# Patient Record
Sex: Female | Born: 1970
Health system: Southern US, Community
[De-identification: ages and names within clinical notes are randomized; demographics above are authoritative.]

## PROBLEM LIST (undated history)

## (undated) ENCOUNTER — Emergency Department (HOSPITAL_COMMUNITY): Admission: EM | Discharge status: Absent without leave | Payer: BC Managed Care – PPO

## (undated) DIAGNOSIS — N183 Chronic kidney disease, stage 3 unspecified: Secondary | ICD-10-CM

## (undated) DIAGNOSIS — R000 Tachycardia, unspecified: Secondary | ICD-10-CM

## (undated) DIAGNOSIS — K219 Gastro-esophageal reflux disease without esophagitis: Secondary | ICD-10-CM

## (undated) DIAGNOSIS — R519 Headache, unspecified: Secondary | ICD-10-CM

## (undated) DIAGNOSIS — M810 Age-related osteoporosis without current pathological fracture: Secondary | ICD-10-CM

## (undated) DIAGNOSIS — R0902 Hypoxemia: Secondary | ICD-10-CM

## (undated) DIAGNOSIS — R002 Palpitations: Secondary | ICD-10-CM

## (undated) DIAGNOSIS — R011 Cardiac murmur, unspecified: Secondary | ICD-10-CM

## (undated) DIAGNOSIS — K37 Unspecified appendicitis: Secondary | ICD-10-CM

## (undated) DIAGNOSIS — R7303 Prediabetes: Secondary | ICD-10-CM

## (undated) DIAGNOSIS — L03011 Cellulitis of right finger: Secondary | ICD-10-CM

## (undated) DIAGNOSIS — M726 Necrotizing fasciitis: Secondary | ICD-10-CM

## (undated) DIAGNOSIS — E2740 Unspecified adrenocortical insufficiency: Secondary | ICD-10-CM

## (undated) DIAGNOSIS — I50810 Right heart failure, unspecified: Secondary | ICD-10-CM

## (undated) DIAGNOSIS — I428 Other cardiomyopathies: Secondary | ICD-10-CM

## (undated) DIAGNOSIS — I7300 Raynaud's syndrome without gangrene: Secondary | ICD-10-CM

## (undated) DIAGNOSIS — F32A Depression, unspecified: Secondary | ICD-10-CM

## (undated) DIAGNOSIS — J45991 Cough variant asthma: Secondary | ICD-10-CM

## (undated) DIAGNOSIS — R569 Unspecified convulsions: Secondary | ICD-10-CM

## (undated) DIAGNOSIS — F419 Anxiety disorder, unspecified: Secondary | ICD-10-CM

## (undated) DIAGNOSIS — D279 Benign neoplasm of unspecified ovary: Secondary | ICD-10-CM

## (undated) DIAGNOSIS — I272 Pulmonary hypertension, unspecified: Secondary | ICD-10-CM

## (undated) DIAGNOSIS — C50919 Malignant neoplasm of unspecified site of unspecified female breast: Secondary | ICD-10-CM

## (undated) DIAGNOSIS — I471 Supraventricular tachycardia, unspecified: Secondary | ICD-10-CM

## (undated) DIAGNOSIS — I350 Nonrheumatic aortic (valve) stenosis: Secondary | ICD-10-CM

## (undated) DIAGNOSIS — F329 Major depressive disorder, single episode, unspecified: Secondary | ICD-10-CM

## (undated) DIAGNOSIS — Z87442 Personal history of urinary calculi: Secondary | ICD-10-CM

## (undated) DIAGNOSIS — J189 Pneumonia, unspecified organism: Secondary | ICD-10-CM

## (undated) DIAGNOSIS — Z9981 Dependence on supplemental oxygen: Secondary | ICD-10-CM

## (undated) DIAGNOSIS — R51 Headache: Secondary | ICD-10-CM

## (undated) DIAGNOSIS — C539 Malignant neoplasm of cervix uteri, unspecified: Secondary | ICD-10-CM

## (undated) DIAGNOSIS — G4730 Sleep apnea, unspecified: Secondary | ICD-10-CM

## (undated) DIAGNOSIS — C8190 Hodgkin lymphoma, unspecified, unspecified site: Secondary | ICD-10-CM

## (undated) DIAGNOSIS — D649 Anemia, unspecified: Secondary | ICD-10-CM

## (undated) DIAGNOSIS — E271 Primary adrenocortical insufficiency: Secondary | ICD-10-CM

## (undated) DIAGNOSIS — R112 Nausea with vomiting, unspecified: Secondary | ICD-10-CM

## (undated) DIAGNOSIS — R918 Other nonspecific abnormal finding of lung field: Secondary | ICD-10-CM

## (undated) DIAGNOSIS — D352 Benign neoplasm of pituitary gland: Secondary | ICD-10-CM

## (undated) DIAGNOSIS — I10 Essential (primary) hypertension: Secondary | ICD-10-CM

## (undated) DIAGNOSIS — J9611 Chronic respiratory failure with hypoxia: Secondary | ICD-10-CM

## (undated) DIAGNOSIS — C73 Malignant neoplasm of thyroid gland: Secondary | ICD-10-CM

## (undated) DIAGNOSIS — Z9889 Other specified postprocedural states: Secondary | ICD-10-CM

## (undated) HISTORY — DX: Unspecified adrenocortical insufficiency: E27.40

## (undated) HISTORY — DX: Palpitations: R00.2

## (undated) HISTORY — DX: Raynaud's syndrome without gangrene: I73.00

## (undated) HISTORY — DX: Gastro-esophageal reflux disease without esophagitis: K21.9

## (undated) HISTORY — DX: Tachycardia, unspecified: R00.0

## (undated) HISTORY — DX: Chronic kidney disease, stage 3 unspecified: N18.30

## (undated) HISTORY — DX: Other nonspecific abnormal finding of lung field: R91.8

## (undated) HISTORY — DX: Benign neoplasm of unspecified ovary: D27.9

## (undated) HISTORY — PX: MASTECTOMY: SHX3

## (undated) HISTORY — DX: Right heart failure, unspecified: I50.810

## (undated) HISTORY — DX: Essential (primary) hypertension: I10

## (undated) HISTORY — DX: Unspecified appendicitis: K37

## (undated) HISTORY — PX: COLONOSCOPY: SHX174

## (undated) HISTORY — PX: LUMBAR PUNCTURE W/ INTRATHECAL CHEMOTHERAPY: SHX1986

## (undated) HISTORY — DX: Malignant neoplasm of thyroid gland: C73

## (undated) HISTORY — DX: Hodgkin lymphoma, unspecified, unspecified site: C81.90

## (undated) HISTORY — PX: ABDOMINAL HYSTERECTOMY: SHX81

## (undated) HISTORY — DX: Age-related osteoporosis without current pathological fracture: M81.0

## (undated) HISTORY — PX: APPENDECTOMY: SHX54

## (undated) HISTORY — PX: PITUITARY SURGERY: SHX203

## (undated) HISTORY — PX: KIDNEY STONE SURGERY: SHX686

## (undated) HISTORY — PX: TOTAL THYROIDECTOMY: SHX2547

## (undated) HISTORY — PX: OTHER SURGICAL HISTORY: SHX169

## (undated) HISTORY — DX: Other cardiomyopathies: I42.8

## (undated) HISTORY — DX: Malignant neoplasm of unspecified site of unspecified female breast: C50.919

---
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
## 2003-08-03 ENCOUNTER — Ambulatory Visit (HOSPITAL_COMMUNITY): Admission: RE | Admit: 2003-08-03 | Discharge: 2003-08-03 | Payer: Self-pay | Admitting: *Deleted

## 2003-08-03 ENCOUNTER — Encounter (INDEPENDENT_AMBULATORY_CARE_PROVIDER_SITE_OTHER): Payer: Self-pay | Admitting: Specialist

## 2003-08-23 ENCOUNTER — Ambulatory Visit (HOSPITAL_COMMUNITY): Admission: RE | Admit: 2003-08-23 | Discharge: 2003-08-23 | Payer: Self-pay | Admitting: *Deleted

## 2003-11-05 ENCOUNTER — Ambulatory Visit (HOSPITAL_COMMUNITY): Admission: RE | Admit: 2003-11-05 | Discharge: 2003-11-05 | Payer: Self-pay | Admitting: *Deleted

## 2004-05-16 ENCOUNTER — Encounter: Admission: RE | Admit: 2004-05-16 | Discharge: 2004-05-16 | Payer: Self-pay | Admitting: *Deleted

## 2004-05-17 ENCOUNTER — Encounter (INDEPENDENT_AMBULATORY_CARE_PROVIDER_SITE_OTHER): Payer: Self-pay | Admitting: *Deleted

## 2004-05-17 ENCOUNTER — Ambulatory Visit (HOSPITAL_COMMUNITY): Admission: RE | Admit: 2004-05-17 | Discharge: 2004-05-17 | Payer: Self-pay | Admitting: *Deleted

## 2004-05-25 ENCOUNTER — Encounter: Admission: RE | Admit: 2004-05-25 | Discharge: 2004-05-25 | Payer: Self-pay | Admitting: Otolaryngology

## 2004-08-21 ENCOUNTER — Encounter (INDEPENDENT_AMBULATORY_CARE_PROVIDER_SITE_OTHER): Payer: Self-pay | Admitting: Specialist

## 2004-08-21 ENCOUNTER — Ambulatory Visit (HOSPITAL_COMMUNITY): Admission: RE | Admit: 2004-08-21 | Discharge: 2004-08-21 | Payer: Self-pay | Admitting: *Deleted

## 2004-12-19 ENCOUNTER — Other Ambulatory Visit: Admission: RE | Admit: 2004-12-19 | Discharge: 2004-12-19 | Payer: Self-pay | Admitting: Gynecology

## 2005-04-15 ENCOUNTER — Inpatient Hospital Stay (HOSPITAL_COMMUNITY): Admission: AD | Admit: 2005-04-15 | Discharge: 2005-04-15 | Payer: Self-pay | Admitting: Obstetrics & Gynecology

## 2005-05-24 ENCOUNTER — Inpatient Hospital Stay (HOSPITAL_COMMUNITY): Admission: AD | Admit: 2005-05-24 | Discharge: 2005-05-24 | Payer: Self-pay | Admitting: Obstetrics & Gynecology

## 2005-06-23 ENCOUNTER — Inpatient Hospital Stay (HOSPITAL_COMMUNITY): Admission: AD | Admit: 2005-06-23 | Discharge: 2005-06-23 | Payer: Self-pay | Admitting: Obstetrics and Gynecology

## 2005-06-25 ENCOUNTER — Observation Stay (HOSPITAL_COMMUNITY): Admission: AD | Admit: 2005-06-25 | Discharge: 2005-06-25 | Payer: Self-pay | Admitting: Obstetrics and Gynecology

## 2005-07-10 ENCOUNTER — Ambulatory Visit: Payer: Self-pay | Admitting: Neonatology

## 2005-07-10 ENCOUNTER — Inpatient Hospital Stay (HOSPITAL_COMMUNITY): Admission: AD | Admit: 2005-07-10 | Discharge: 2005-07-18 | Payer: Self-pay | Admitting: *Deleted

## 2005-07-18 ENCOUNTER — Encounter (INDEPENDENT_AMBULATORY_CARE_PROVIDER_SITE_OTHER): Payer: Self-pay | Admitting: *Deleted

## 2005-12-27 ENCOUNTER — Ambulatory Visit (HOSPITAL_COMMUNITY): Admission: RE | Admit: 2005-12-27 | Discharge: 2005-12-27 | Payer: Self-pay | Admitting: Obstetrics and Gynecology

## 2006-03-08 ENCOUNTER — Inpatient Hospital Stay (HOSPITAL_COMMUNITY): Admission: AD | Admit: 2006-03-08 | Discharge: 2006-03-16 | Payer: Self-pay | Admitting: Obstetrics and Gynecology

## 2006-03-11 ENCOUNTER — Ambulatory Visit: Payer: Self-pay | Admitting: Neonatology

## 2006-03-12 ENCOUNTER — Encounter (INDEPENDENT_AMBULATORY_CARE_PROVIDER_SITE_OTHER): Payer: Self-pay | Admitting: Specialist

## 2006-03-12 ENCOUNTER — Ambulatory Visit: Payer: Self-pay | Admitting: Neonatology

## 2006-04-12 ENCOUNTER — Ambulatory Visit (HOSPITAL_COMMUNITY): Admission: RE | Admit: 2006-04-12 | Discharge: 2006-04-12 | Payer: Self-pay | Admitting: Obstetrics and Gynecology

## 2006-11-19 ENCOUNTER — Emergency Department (HOSPITAL_COMMUNITY): Admission: EM | Admit: 2006-11-19 | Discharge: 2006-11-19 | Payer: Self-pay | Admitting: Emergency Medicine

## 2008-12-07 ENCOUNTER — Emergency Department (HOSPITAL_COMMUNITY): Admission: EM | Admit: 2008-12-07 | Discharge: 2008-12-07 | Payer: Self-pay | Admitting: Emergency Medicine

## 2008-12-20 ENCOUNTER — Observation Stay (HOSPITAL_COMMUNITY): Admission: AD | Admit: 2008-12-20 | Discharge: 2008-12-21 | Payer: Self-pay | Admitting: Cardiovascular Disease

## 2009-03-15 ENCOUNTER — Emergency Department (HOSPITAL_COMMUNITY): Admission: EM | Admit: 2009-03-15 | Discharge: 2009-03-15 | Payer: Self-pay | Admitting: Emergency Medicine

## 2009-05-05 ENCOUNTER — Encounter: Admission: RE | Admit: 2009-05-05 | Discharge: 2009-05-05 | Payer: Self-pay | Admitting: *Deleted

## 2009-05-10 ENCOUNTER — Emergency Department (HOSPITAL_BASED_OUTPATIENT_CLINIC_OR_DEPARTMENT_OTHER): Admission: EM | Admit: 2009-05-10 | Discharge: 2009-05-10 | Payer: Self-pay | Admitting: Emergency Medicine

## 2009-05-18 ENCOUNTER — Inpatient Hospital Stay (HOSPITAL_BASED_OUTPATIENT_CLINIC_OR_DEPARTMENT_OTHER): Admission: RE | Admit: 2009-05-18 | Discharge: 2009-05-18 | Payer: Self-pay | Admitting: Cardiovascular Disease

## 2009-05-18 HISTORY — PX: CARDIAC CATHETERIZATION: SHX172

## 2009-05-20 ENCOUNTER — Encounter: Admission: RE | Admit: 2009-05-20 | Discharge: 2009-05-20 | Payer: Self-pay | Admitting: Anesthesiology

## 2009-07-29 ENCOUNTER — Emergency Department (HOSPITAL_COMMUNITY): Admission: EM | Admit: 2009-07-29 | Discharge: 2009-07-29 | Payer: Self-pay | Admitting: Emergency Medicine

## 2009-07-30 ENCOUNTER — Emergency Department (HOSPITAL_COMMUNITY): Admission: EM | Admit: 2009-07-30 | Discharge: 2009-07-31 | Payer: Self-pay | Admitting: Emergency Medicine

## 2009-09-27 ENCOUNTER — Emergency Department (HOSPITAL_COMMUNITY): Admission: EM | Admit: 2009-09-27 | Discharge: 2009-09-27 | Payer: Self-pay | Admitting: Emergency Medicine

## 2009-10-07 ENCOUNTER — Emergency Department (HOSPITAL_COMMUNITY): Admission: EM | Admit: 2009-10-07 | Discharge: 2009-10-07 | Payer: Self-pay | Admitting: Emergency Medicine

## 2009-11-22 ENCOUNTER — Emergency Department (HOSPITAL_COMMUNITY): Admission: EM | Admit: 2009-11-22 | Discharge: 2009-11-22 | Payer: Self-pay | Admitting: Emergency Medicine

## 2009-12-04 ENCOUNTER — Emergency Department (HOSPITAL_COMMUNITY): Admission: EM | Admit: 2009-12-04 | Discharge: 2009-12-04 | Payer: Self-pay | Admitting: Family Medicine

## 2009-12-19 ENCOUNTER — Ambulatory Visit (HOSPITAL_COMMUNITY): Admission: EM | Admit: 2009-12-19 | Discharge: 2009-12-20 | Payer: Self-pay | Admitting: Emergency Medicine

## 2009-12-19 ENCOUNTER — Encounter (INDEPENDENT_AMBULATORY_CARE_PROVIDER_SITE_OTHER): Payer: Self-pay | Admitting: Surgery

## 2009-12-19 DIAGNOSIS — K37 Unspecified appendicitis: Secondary | ICD-10-CM

## 2009-12-19 HISTORY — DX: Unspecified appendicitis: K37

## 2009-12-20 ENCOUNTER — Emergency Department (HOSPITAL_COMMUNITY): Admission: EM | Admit: 2009-12-20 | Discharge: 2009-12-20 | Payer: Self-pay | Admitting: Emergency Medicine

## 2009-12-29 ENCOUNTER — Emergency Department (HOSPITAL_COMMUNITY): Admission: EM | Admit: 2009-12-29 | Discharge: 2009-12-29 | Payer: Self-pay | Admitting: Emergency Medicine

## 2010-01-23 ENCOUNTER — Emergency Department (HOSPITAL_COMMUNITY): Admission: EM | Admit: 2010-01-23 | Discharge: 2010-01-23 | Payer: Self-pay | Admitting: Emergency Medicine

## 2010-01-28 ENCOUNTER — Emergency Department (HOSPITAL_COMMUNITY): Admission: EM | Admit: 2010-01-28 | Discharge: 2010-01-28 | Payer: Self-pay | Admitting: Emergency Medicine

## 2010-01-28 ENCOUNTER — Emergency Department (HOSPITAL_COMMUNITY): Admission: EM | Admit: 2010-01-28 | Discharge: 2010-01-28 | Payer: Self-pay | Admitting: Family Medicine

## 2010-03-17 ENCOUNTER — Inpatient Hospital Stay (HOSPITAL_COMMUNITY)
Admission: EM | Admit: 2010-03-17 | Discharge: 2010-03-18 | Payer: Self-pay | Source: Home / Self Care | Admitting: Emergency Medicine

## 2010-03-18 ENCOUNTER — Encounter (INDEPENDENT_AMBULATORY_CARE_PROVIDER_SITE_OTHER): Payer: Self-pay | Admitting: Internal Medicine

## 2010-04-11 ENCOUNTER — Emergency Department (HOSPITAL_COMMUNITY): Admission: EM | Admit: 2010-04-11 | Discharge: 2010-04-11 | Payer: Self-pay | Admitting: Emergency Medicine

## 2010-05-18 ENCOUNTER — Ambulatory Visit: Payer: Self-pay | Admitting: Cardiovascular Disease

## 2010-07-10 ENCOUNTER — Ambulatory Visit: Payer: Self-pay | Admitting: Cardiovascular Disease

## 2010-09-12 ENCOUNTER — Emergency Department (HOSPITAL_BASED_OUTPATIENT_CLINIC_OR_DEPARTMENT_OTHER)
Admission: EM | Admit: 2010-09-12 | Discharge: 2010-09-12 | Payer: Self-pay | Source: Home / Self Care | Admitting: Emergency Medicine

## 2010-10-13 ENCOUNTER — Ambulatory Visit: Payer: Self-pay | Admitting: Cardiovascular Disease

## 2010-10-22 ENCOUNTER — Encounter: Payer: Self-pay | Admitting: Family Medicine

## 2010-11-06 ENCOUNTER — Ambulatory Visit (INDEPENDENT_AMBULATORY_CARE_PROVIDER_SITE_OTHER): Payer: Self-pay | Admitting: Cardiovascular Disease

## 2010-11-06 DIAGNOSIS — I359 Nonrheumatic aortic valve disorder, unspecified: Secondary | ICD-10-CM

## 2010-11-10 ENCOUNTER — Ambulatory Visit (HOSPITAL_COMMUNITY)
Admission: RE | Admit: 2010-11-10 | Discharge: 2010-11-10 | Disposition: A | Discharge status: Home / Self Care | Payer: Commercial Managed Care - PPO | Source: Ambulatory Visit | Attending: Cardiovascular Disease | Admitting: Cardiovascular Disease

## 2010-11-10 DIAGNOSIS — I359 Nonrheumatic aortic valve disorder, unspecified: Secondary | ICD-10-CM

## 2010-11-10 DIAGNOSIS — Z0389 Encounter for observation for other suspected diseases and conditions ruled out: Secondary | ICD-10-CM | POA: Insufficient documentation

## 2010-11-29 ENCOUNTER — Ambulatory Visit (INDEPENDENT_AMBULATORY_CARE_PROVIDER_SITE_OTHER): Payer: Commercial Managed Care - PPO | Admitting: Cardiology

## 2010-11-29 DIAGNOSIS — R609 Edema, unspecified: Secondary | ICD-10-CM

## 2010-11-29 DIAGNOSIS — R05 Cough: Secondary | ICD-10-CM

## 2010-11-29 DIAGNOSIS — R059 Cough, unspecified: Secondary | ICD-10-CM

## 2010-12-12 LAB — BASIC METABOLIC PANEL
CO2: 27 mEq/L (ref 19–32)
Calcium: 9.3 mg/dL (ref 8.4–10.5)
Chloride: 105 mEq/L (ref 96–112)
GFR calc Af Amer: >60 mL/min (ref >60)
Sodium: 143 mEq/L (ref 135–145)

## 2010-12-12 LAB — CBC
Hemoglobin: 12.2 g/dL (ref 12.0–15.0)
MCH: 27.7 pg (ref 26.0–34.0)
MCV: 83 fL (ref 78.0–100.0)
RBC: 4.4 MIL/uL (ref 3.87–5.11)
WBC: 8 10*3/uL (ref 4.0–10.5)

## 2010-12-12 LAB — DIFFERENTIAL
Eosinophils Absolute: 0.1 10*3/uL (ref 0.0–0.7)
Lymphocytes Relative: 18 % (ref 12–46)
Lymphs Abs: 1.4 10*3/uL (ref 0.7–4.0)
Monocytes Relative: 5 % (ref 3–12)
Neutrophils Relative %: 76 % (ref 43–77)

## 2010-12-12 LAB — HEPATIC FUNCTION PANEL
ALT: 21 U/L (ref 0–35)
AST: 20 U/L (ref 0–37)
Albumin: 4.3 g/dL (ref 3.5–5.2)
Alkaline Phosphatase: 68 U/L (ref 39–117)
Total Bilirubin: 0.5 mg/dL (ref 0.3–1.2)
Total Protein: 7.8 g/dL (ref 6.0–8.3)

## 2010-12-12 LAB — URINALYSIS, ROUTINE W REFLEX MICROSCOPIC
Glucose, UA: NEGATIVE mg/dL
Hgb urine dipstick: NEGATIVE
Specific Gravity, Urine: 1.012 (ref 1.005–1.030)

## 2010-12-12 LAB — LIPASE, BLOOD: Lipase: 96 U/L (ref 23–300)

## 2010-12-17 LAB — CK TOTAL AND CKMB (NOT AT ARMC)
Relative Index: 1.8 (ref 0.0–2.5)
Total CK: 171 U/L (ref 7–177)

## 2010-12-17 LAB — DIFFERENTIAL
Basophils Absolute: 0 10*3/uL (ref 0.0–0.1)
Basophils Relative: 1 % (ref 0–1)
Eosinophils Absolute: 0 10*3/uL (ref 0.0–0.7)
Eosinophils Absolute: 0 10*3/uL (ref 0.0–0.7)
Eosinophils Relative: 0 % (ref 0–5)
Eosinophils Relative: 1 % (ref 0–5)
Lymphocytes Relative: 20 % (ref 12–46)
Lymphs Abs: 1.7 10*3/uL (ref 0.7–4.0)
Monocytes Absolute: 0.5 10*3/uL (ref 0.1–1.0)
Monocytes Absolute: 0.7 10*3/uL (ref 0.1–1.0)
Monocytes Relative: 6 % (ref 3–12)

## 2010-12-17 LAB — COMPREHENSIVE METABOLIC PANEL
ALT: 16 U/L (ref 0–35)
AST: 20 U/L (ref 0–37)
AST: 22 U/L (ref 0–37)
Albumin: 3.6 g/dL (ref 3.5–5.2)
Albumin: 4.3 g/dL (ref 3.5–5.2)
Alkaline Phosphatase: 23 U/L — ABNORMAL LOW (ref 39–117)
BUN: 6 mg/dL (ref 6–23)
Calcium: 9.5 mg/dL (ref 8.4–10.5)
Chloride: 103 mEq/L (ref 96–112)
Creatinine, Ser: 0.89 mg/dL (ref 0.4–1.2)
GFR calc Af Amer: >60 mL/min (ref >60)
GFR calc Af Amer: >60 mL/min (ref >60)
Potassium: 3.8 mEq/L (ref 3.5–5.1)
Sodium: 138 mEq/L (ref 135–145)
Sodium: 139 mEq/L (ref 135–145)
Total Protein: 6.7 g/dL (ref 6.0–8.3)

## 2010-12-17 LAB — URINALYSIS, ROUTINE W REFLEX MICROSCOPIC
Bilirubin Urine: NEGATIVE
Bilirubin Urine: NEGATIVE
Hgb urine dipstick: NEGATIVE
Nitrite: NEGATIVE
Specific Gravity, Urine: 1.019 (ref 1.005–1.030)
Specific Gravity, Urine: 1.006 (ref 1.005–1.030)
Urobilinogen, UA: 1 mg/dL (ref 0.0–1.0)
Urobilinogen, UA: 1 mg/dL (ref 0.0–1.0)
pH: 6.5 (ref 5.0–8.0)

## 2010-12-17 LAB — LIPID PANEL
Triglycerides: 144 mg/dL (ref <150)
VLDL: 29 mg/dL (ref 0–40)

## 2010-12-17 LAB — CBC
HCT: 40.6 % (ref 36.0–46.0)
HCT: 37.3 % (ref 36.0–46.0)
HCT: 41.7 % (ref 36.0–46.0)
Hemoglobin: 13.6 g/dL (ref 12.0–15.0)
MCH: 29.8 pg (ref 26.0–34.0)
MCHC: 33.6 g/dL (ref 30.0–36.0)
MCV: 88.6 fL (ref 78.0–100.0)
MCV: 88.9 fL (ref 78.0–100.0)
MCV: 89.4 fL (ref 78.0–100.0)
Platelets: 254 10*3/uL (ref 150–400)
Platelets: 261 10*3/uL (ref 150–400)
Platelets: 298 10*3/uL (ref 150–400)
RBC: 4.66 MIL/uL (ref 3.87–5.11)
RDW: 14.6 % (ref 11.5–15.5)
RDW: 13.4 % (ref 11.5–15.5)
WBC: 6.3 10*3/uL (ref 4.0–10.5)
WBC: 6.5 10*3/uL (ref 4.0–10.5)
WBC: 8.2 10*3/uL (ref 4.0–10.5)

## 2010-12-17 LAB — GLUCOSE, CAPILLARY: Glucose-Capillary: 72 mg/dL (ref 70–99)

## 2010-12-17 LAB — BASIC METABOLIC PANEL
Calcium: 8.9 mg/dL (ref 8.4–10.5)
Chloride: 105 mEq/L (ref 96–112)
Creatinine, Ser: 0.82 mg/dL (ref 0.4–1.2)
GFR calc Af Amer: >60 mL/min (ref >60)
Sodium: 136 mEq/L (ref 135–145)

## 2010-12-17 LAB — POCT PREGNANCY, URINE: Preg Test, Ur: NEGATIVE

## 2010-12-17 LAB — CARDIAC PANEL(CRET KIN+CKTOT+MB+TROPI)
CK, MB: 1.9 ng/mL (ref 0.3–4.0)
CK, MB: 2.2 ng/mL (ref 0.3–4.0)
Relative Index: 1.5 (ref 0.0–2.5)
Relative Index: 1.5 (ref 0.0–2.5)
Total CK: 123 U/L (ref 7–177)
Total CK: 143 U/L (ref 7–177)
Troponin I: 0.01 ng/mL (ref 0.00–0.06)
Troponin I: 0.02 ng/mL (ref 0.00–0.06)

## 2010-12-17 LAB — TROPONIN I: Troponin I: <0.01 ng/mL (ref 0.00–0.06)

## 2010-12-17 LAB — PROTIME-INR: INR: 1.05 (ref 0.00–1.49)

## 2010-12-17 LAB — T4, FREE: Free T4: 1 ng/dL (ref 0.80–1.80)

## 2010-12-17 LAB — HOMOCYSTEINE: Homocysteine: 12 umol/L (ref 4.0–15.4)

## 2010-12-17 LAB — HEMOGLOBIN A1C
Hgb A1c MFr Bld: 5.4 % (ref <5.7)
Mean Plasma Glucose: 108 mg/dL (ref <117)

## 2010-12-17 LAB — POCT CARDIAC MARKERS: CKMB, poc: 1.4 ng/mL (ref 1.0–8.0)

## 2010-12-19 LAB — BASIC METABOLIC PANEL
BUN: 7 mg/dL (ref 6–23)
CO2: 27 mEq/L (ref 19–32)
Chloride: 106 mEq/L (ref 96–112)
Creatinine, Ser: 0.67 mg/dL (ref 0.4–1.2)
Glucose, Bld: 94 mg/dL (ref 70–99)

## 2010-12-19 LAB — URINE CULTURE: Colony Count: 90000

## 2010-12-19 LAB — POCT I-STAT, CHEM 8
BUN: 12 mg/dL (ref 6–23)
Calcium, Ion: 1.03 mmol/L — ABNORMAL LOW (ref 1.12–1.32)
Chloride: 105 mEq/L (ref 96–112)
Creatinine, Ser: 0.8 mg/dL (ref 0.4–1.2)
Glucose, Bld: 92 mg/dL (ref 70–99)
HCT: 42 % (ref 36.0–46.0)
Potassium: 3.6 mEq/L (ref 3.5–5.1)

## 2010-12-19 LAB — URINALYSIS, ROUTINE W REFLEX MICROSCOPIC
Glucose, UA: NEGATIVE mg/dL
Ketones, ur: 40 mg/dL — AB
Ketones, ur: NEGATIVE mg/dL
Leukocytes, UA: NEGATIVE
Nitrite: NEGATIVE
Protein, ur: 100 mg/dL — AB
Protein, ur: NEGATIVE mg/dL
Urobilinogen, UA: 0.2 mg/dL (ref 0.0–1.0)

## 2010-12-19 LAB — DIFFERENTIAL
Basophils Relative: 1 % (ref 0–1)
Basophils Relative: 1 % (ref 0–1)
Eosinophils Absolute: 0.1 10*3/uL (ref 0.0–0.7)
Eosinophils Relative: 1 % (ref 0–5)
Lymphs Abs: 1.6 10*3/uL (ref 0.7–4.0)
Monocytes Absolute: 0.5 10*3/uL (ref 0.1–1.0)
Monocytes Relative: 8 % (ref 3–12)
Monocytes Relative: 8 % (ref 3–12)
Neutro Abs: 4.2 10*3/uL (ref 1.7–7.7)
Neutrophils Relative %: 67 % (ref 43–77)

## 2010-12-19 LAB — URINE MICROSCOPIC-ADD ON

## 2010-12-19 LAB — POCT PREGNANCY, URINE
Preg Test, Ur: NEGATIVE
Preg Test, Ur: NEGATIVE

## 2010-12-19 LAB — CBC
Hemoglobin: 13.5 g/dL (ref 12.0–15.0)
MCHC: 34.5 g/dL (ref 30.0–36.0)
MCHC: 33.1 g/dL (ref 30.0–36.0)
MCV: 89.9 fL (ref 78.0–100.0)
Platelets: 220 10*3/uL (ref 150–400)
RBC: 4.48 MIL/uL (ref 3.87–5.11)
WBC: 6.4 10*3/uL (ref 4.0–10.5)

## 2010-12-19 LAB — GC/CHLAMYDIA PROBE AMP, GENITAL: GC Probe Amp, Genital: NEGATIVE

## 2010-12-19 LAB — HEMOCCULT GUIAC POC 1CARD (OFFICE): Fecal Occult Bld: NEGATIVE

## 2010-12-19 LAB — WET PREP, GENITAL: Yeast Wet Prep HPF POC: NONE SEEN

## 2010-12-20 LAB — POCT I-STAT, CHEM 8
Calcium, Ion: 0.98 mmol/L — ABNORMAL LOW (ref 1.12–1.32)
Chloride: 111 mEq/L (ref 96–112)
HCT: 43 % (ref 36.0–46.0)
TCO2: 20 mmol/L (ref 0–100)

## 2010-12-20 LAB — POCT CARDIAC MARKERS

## 2010-12-25 LAB — URINALYSIS, ROUTINE W REFLEX MICROSCOPIC
Bilirubin Urine: NEGATIVE
Glucose, UA: NEGATIVE mg/dL
Hgb urine dipstick: NEGATIVE
Ketones, ur: NEGATIVE mg/dL
Nitrite: NEGATIVE
Nitrite: NEGATIVE
Protein, ur: 100 mg/dL — AB
Protein, ur: NEGATIVE mg/dL
Specific Gravity, Urine: 1.005 (ref 1.005–1.030)
Urobilinogen, UA: 1 mg/dL (ref 0.0–1.0)
Urobilinogen, UA: 1 mg/dL (ref 0.0–1.0)
pH: 6 (ref 5.0–8.0)
pH: 6.5 (ref 5.0–8.0)

## 2010-12-25 LAB — DIFFERENTIAL
Basophils Absolute: 0.1 10*3/uL (ref 0.0–0.1)
Basophils Relative: 1 % (ref 0–1)
Eosinophils Absolute: 0.1 10*3/uL (ref 0.0–0.7)
Eosinophils Relative: 1 % (ref 0–5)
Lymphocytes Relative: 17 % (ref 12–46)
Lymphs Abs: 1.6 10*3/uL (ref 0.7–4.0)
Monocytes Absolute: 0.6 10*3/uL (ref 0.1–1.0)
Monocytes Relative: 6 % (ref 3–12)
Neutro Abs: 7.2 10*3/uL (ref 1.7–7.7)
Neutrophils Relative %: 75 % (ref 43–77)

## 2010-12-25 LAB — LIPASE, BLOOD: Lipase: 24 U/L (ref 11–59)

## 2010-12-25 LAB — COMPREHENSIVE METABOLIC PANEL
ALT: 17 U/L (ref 0–35)
AST: 27 U/L (ref 0–37)
Albumin: 4.1 g/dL (ref 3.5–5.2)
Alkaline Phosphatase: 35 U/L — ABNORMAL LOW (ref 39–117)
BUN: 9 mg/dL (ref 6–23)
CO2: 25 mEq/L (ref 19–32)
Calcium: 9.2 mg/dL (ref 8.4–10.5)
Chloride: 109 mEq/L (ref 96–112)
Creatinine, Ser: 0.8 mg/dL (ref 0.4–1.2)
GFR calc Af Amer: >60 mL/min (ref >60)
GFR calc non Af Amer: >60 mL/min (ref >60)
Glucose, Bld: 93 mg/dL (ref 70–99)
Potassium: 3.7 mEq/L (ref 3.5–5.1)
Sodium: 141 mEq/L (ref 135–145)
Total Bilirubin: 0.7 mg/dL (ref 0.3–1.2)
Total Protein: 7.1 g/dL (ref 6.0–8.3)

## 2010-12-25 LAB — CBC
HCT: 40.8 % (ref 36.0–46.0)
Hemoglobin: 13.4 g/dL (ref 12.0–15.0)
MCHC: 33 g/dL (ref 30.0–36.0)
MCV: 90.4 fL (ref 78.0–100.0)
Platelets: 194 10*3/uL (ref 150–400)
RBC: 4.51 MIL/uL (ref 3.87–5.11)
RDW: 14.9 % (ref 11.5–15.5)
WBC: 9.6 10*3/uL (ref 4.0–10.5)

## 2010-12-25 LAB — URINE MICROSCOPIC-ADD ON

## 2010-12-25 LAB — POCT PREGNANCY, URINE
Preg Test, Ur: NEGATIVE
Preg Test, Ur: NEGATIVE

## 2011-01-01 LAB — URINALYSIS, ROUTINE W REFLEX MICROSCOPIC
Bilirubin Urine: NEGATIVE
Glucose, UA: NEGATIVE mg/dL
Hgb urine dipstick: NEGATIVE
Specific Gravity, Urine: 1.005 (ref 1.005–1.030)
pH: 7.5 (ref 5.0–8.0)

## 2011-01-01 LAB — CBC
HCT: 42 % (ref 36.0–46.0)
Hemoglobin: 14 g/dL (ref 12.0–15.0)
MCHC: 33.3 g/dL (ref 30.0–36.0)
MCV: 87.3 fL (ref 78.0–100.0)
RBC: 4.81 MIL/uL (ref 3.87–5.11)
WBC: 5.9 10*3/uL (ref 4.0–10.5)

## 2011-01-01 LAB — COMPREHENSIVE METABOLIC PANEL
ALT: 18 U/L (ref 0–35)
CO2: 25 mEq/L (ref 19–32)
Calcium: 9.2 mg/dL (ref 8.4–10.5)
Chloride: 106 mEq/L (ref 96–112)
Creatinine, Ser: 0.76 mg/dL (ref 0.4–1.2)
GFR calc non Af Amer: >60 mL/min (ref >60)
Glucose, Bld: 93 mg/dL (ref 70–99)
Total Bilirubin: 0.7 mg/dL (ref 0.3–1.2)

## 2011-01-01 LAB — POCT CARDIAC MARKERS
CKMB, poc: 1.5 ng/mL (ref 1.0–8.0)
Myoglobin, poc: 64.1 ng/mL (ref 12–200)

## 2011-01-01 LAB — LIPASE, BLOOD: Lipase: 29 U/L (ref 11–59)

## 2011-01-01 LAB — DIFFERENTIAL
Basophils Absolute: 0.1 10*3/uL (ref 0.0–0.1)
Eosinophils Absolute: 0.1 10*3/uL (ref 0.0–0.7)
Eosinophils Relative: 1 % (ref 0–5)
Lymphocytes Relative: 33 % (ref 12–46)
Lymphs Abs: 1.9 10*3/uL (ref 0.7–4.0)
Neutrophils Relative %: 57 % (ref 43–77)

## 2011-01-01 LAB — T4: T4, Total: 8.9 ug/dL (ref 5.0–12.5)

## 2011-01-01 LAB — T3, FREE: T3, Free: 2.6 pg/mL (ref 2.3–4.2)

## 2011-01-04 LAB — DIFFERENTIAL
Basophils Absolute: 0.1 10*3/uL (ref 0.0–0.1)
Basophils Relative: 1 % (ref 0–1)
Eosinophils Absolute: 0.1 10*3/uL (ref 0.0–0.7)
Eosinophils Relative: 1 % (ref 0–5)
Lymphocytes Relative: 28 % (ref 12–46)
Lymphs Abs: 1.3 10*3/uL (ref 0.7–4.0)
Lymphs Abs: 1.8 10*3/uL (ref 0.7–4.0)
Neutrophils Relative %: 62 % (ref 43–77)
Neutrophils Relative %: 63 % (ref 43–77)

## 2011-01-04 LAB — COMPREHENSIVE METABOLIC PANEL
ALT: 16 U/L (ref 0–35)
AST: 18 U/L (ref 0–37)
Alkaline Phosphatase: 42 U/L (ref 39–117)
CO2: 24 mEq/L (ref 19–32)
CO2: 24 mEq/L (ref 19–32)
Calcium: 8.7 mg/dL (ref 8.4–10.5)
Chloride: 103 mEq/L (ref 96–112)
Creatinine, Ser: 0.8 mg/dL (ref 0.4–1.2)
GFR calc Af Amer: >60 mL/min (ref >60)
GFR calc Af Amer: >60 mL/min (ref >60)
GFR calc non Af Amer: >60 mL/min (ref >60)
GFR calc non Af Amer: >60 mL/min (ref >60)
Glucose, Bld: 86 mg/dL (ref 70–99)
Glucose, Bld: 97 mg/dL (ref 70–99)
Potassium: 3.4 mEq/L — ABNORMAL LOW (ref 3.5–5.1)
Sodium: 135 mEq/L (ref 135–145)
Total Bilirubin: 0.8 mg/dL (ref 0.3–1.2)

## 2011-01-04 LAB — CBC
Hemoglobin: 13.2 g/dL (ref 12.0–15.0)
Hemoglobin: 13.6 g/dL (ref 12.0–15.0)
MCHC: 34.2 g/dL (ref 30.0–36.0)
MCHC: 34.3 g/dL (ref 30.0–36.0)
MCV: 89.2 fL (ref 78.0–100.0)
RBC: 4.32 MIL/uL (ref 3.87–5.11)
RBC: 4.45 MIL/uL (ref 3.87–5.11)
WBC: 6.5 10*3/uL (ref 4.0–10.5)

## 2011-01-04 LAB — LIPASE, BLOOD: Lipase: 25 U/L (ref 11–59)

## 2011-01-04 LAB — TROPONIN I: Troponin I: <0.01 ng/mL (ref 0.00–0.06)

## 2011-01-06 LAB — POCT I-STAT 3, VENOUS BLOOD GAS (G3P V)
O2 Saturation: 77 %
TCO2: 24 mmol/L (ref 0–100)
pCO2, Ven: 40 mmHg — ABNORMAL LOW (ref 45.0–50.0)
pO2, Ven: 43 mmHg (ref 30.0–45.0)

## 2011-01-06 LAB — POCT I-STAT 3, ART BLOOD GAS (G3+)
pCO2 arterial: 38.3 mmHg (ref 35.0–45.0)
pO2, Arterial: 78 mmHg — ABNORMAL LOW (ref 80.0–100.0)

## 2011-01-07 LAB — URINE CULTURE

## 2011-01-07 LAB — URINALYSIS, ROUTINE W REFLEX MICROSCOPIC
Bilirubin Urine: NEGATIVE
Glucose, UA: NEGATIVE mg/dL
Ketones, ur: NEGATIVE mg/dL
Leukocytes, UA: NEGATIVE
pH: 6 (ref 5.0–8.0)

## 2011-01-07 LAB — DIFFERENTIAL
Basophils Absolute: 0.1 10*3/uL (ref 0.0–0.1)
Basophils Relative: 1 % (ref 0–1)
Eosinophils Absolute: 0.1 10*3/uL (ref 0.0–0.7)
Eosinophils Relative: 1 % (ref 0–5)
Lymphocytes Relative: 28 % (ref 12–46)
Lymphs Abs: 1.8 10*3/uL (ref 0.7–4.0)
Monocytes Absolute: 0.6 10*3/uL (ref 0.1–1.0)
Monocytes Relative: 10 % (ref 3–12)
Neutro Abs: 3.8 10*3/uL (ref 1.7–7.7)
Neutrophils Relative %: 60 % (ref 43–77)

## 2011-01-07 LAB — CBC
HCT: 42.9 % (ref 36.0–46.0)
Hemoglobin: 14.2 g/dL (ref 12.0–15.0)
MCHC: 33.2 g/dL (ref 30.0–36.0)
MCV: 88.5 fL (ref 78.0–100.0)
Platelets: 261 10*3/uL (ref 150–400)
RBC: 4.84 MIL/uL (ref 3.87–5.11)
RDW: 12.7 % (ref 11.5–15.5)
WBC: 6.4 10*3/uL (ref 4.0–10.5)

## 2011-01-07 LAB — COMPREHENSIVE METABOLIC PANEL
ALT: 10 U/L (ref 0–35)
AST: 23 U/L (ref 0–37)
Albumin: 4.3 g/dL (ref 3.5–5.2)
Alkaline Phosphatase: 52 U/L (ref 39–117)
GFR calc Af Amer: >60 mL/min (ref >60)
Glucose, Bld: 83 mg/dL (ref 70–99)
Potassium: 3.9 mEq/L (ref 3.5–5.1)
Sodium: 140 mEq/L (ref 135–145)
Total Protein: 7.8 g/dL (ref 6.0–8.3)

## 2011-01-07 LAB — URINE MICROSCOPIC-ADD ON

## 2011-01-07 LAB — CORTISOL: Cortisol, Plasma: 7.6 ug/dL

## 2011-01-07 LAB — TSH: TSH: 1.023 u[IU]/mL (ref 0.350–4.500)

## 2011-01-11 LAB — COMPREHENSIVE METABOLIC PANEL
Albumin: 4.1 g/dL (ref 3.5–5.2)
Alkaline Phosphatase: 48 U/L (ref 39–117)
BUN: 8 mg/dL (ref 6–23)
Chloride: 107 mEq/L (ref 96–112)
Creatinine, Ser: 0.79 mg/dL (ref 0.4–1.2)
Glucose, Bld: 100 mg/dL — ABNORMAL HIGH (ref 70–99)
Total Bilirubin: 0.7 mg/dL (ref 0.3–1.2)
Total Protein: 6.9 g/dL (ref 6.0–8.3)

## 2011-01-11 LAB — CK TOTAL AND CKMB (NOT AT ARMC)
CK, MB: 0.9 ng/mL (ref 0.3–4.0)
Relative Index: INVALID (ref 0.0–2.5)
Relative Index: INVALID (ref 0.0–2.5)
Total CK: 58 U/L (ref 7–177)
Total CK: 79 U/L (ref 7–177)

## 2011-01-11 LAB — CBC
HCT: 39.7 % (ref 36.0–46.0)
Hemoglobin: 13.5 g/dL (ref 12.0–15.0)
MCV: 88.8 fL (ref 78.0–100.0)
Platelets: 248 10*3/uL (ref 150–400)
RDW: 13.5 % (ref 11.5–15.5)

## 2011-01-11 LAB — PROTIME-INR
INR: 1 (ref 0.00–1.49)
Prothrombin Time: 13.1 seconds (ref 11.6–15.2)

## 2011-01-11 LAB — APTT: aPTT: 28 seconds (ref 24–37)

## 2011-01-11 LAB — TROPONIN I: Troponin I: <0.01 ng/mL (ref 0.00–0.06)

## 2011-01-11 LAB — TSH: TSH: 0.746 u[IU]/mL (ref 0.350–4.500)

## 2011-01-29 ENCOUNTER — Telehealth: Payer: Self-pay | Admitting: Cardiovascular Disease

## 2011-01-29 NOTE — Telephone Encounter (Signed)
I was not able to find that med on [facility].  I think it will be fine to go on it.

## 2011-01-29 NOTE — Telephone Encounter (Signed)
Spoke with pt and her PCP dr motimer wants to try her on Adipex for a few weeks short term, she wanted to know if this is ok with you. Chart on desk. Please advise. i told her i would call her back tomorrow afternoon. Corwin Levins RN

## 2011-01-29 NOTE — Telephone Encounter (Signed)
ANOTHER PHYSICIAN IS WANTING TO PUT PATIENT ON ANOTHER MED AND SHE WANTS TO CLEAR IT THOROUGH DR [facility] FIRST. PLACED CHART IN BOX.

## 2011-01-29 NOTE — Telephone Encounter (Signed)
On [employer]com diet pill, cardiovascular risk palpitation, pulmonary hypertension. Corwin Levins RN

## 2011-01-30 ENCOUNTER — Telehealth: Payer: Self-pay | Admitting: *Deleted

## 2011-01-30 NOTE — Telephone Encounter (Signed)
Dr prefers you do not take medication adipex due to increase risk of more palpatations. msg left with office number if further questions or concerns.  Corwin Levins RN

## 2011-02-06 ENCOUNTER — Encounter: Payer: Self-pay | Admitting: Cardiovascular Disease

## 2011-02-06 DIAGNOSIS — R0789 Other chest pain: Secondary | ICD-10-CM | POA: Insufficient documentation

## 2011-02-06 DIAGNOSIS — K219 Gastro-esophageal reflux disease without esophagitis: Secondary | ICD-10-CM | POA: Insufficient documentation

## 2011-02-06 DIAGNOSIS — E2740 Unspecified adrenocortical insufficiency: Secondary | ICD-10-CM | POA: Insufficient documentation

## 2011-02-06 DIAGNOSIS — R000 Tachycardia, unspecified: Secondary | ICD-10-CM | POA: Insufficient documentation

## 2011-02-06 DIAGNOSIS — Z8571 Personal history of Hodgkin lymphoma: Secondary | ICD-10-CM | POA: Insufficient documentation

## 2011-02-06 DIAGNOSIS — C73 Malignant neoplasm of thyroid gland: Secondary | ICD-10-CM | POA: Insufficient documentation

## 2011-02-06 DIAGNOSIS — Z86000 Personal history of in-situ neoplasm of breast: Secondary | ICD-10-CM | POA: Insufficient documentation

## 2011-02-06 DIAGNOSIS — I7300 Raynaud's syndrome without gangrene: Secondary | ICD-10-CM | POA: Insufficient documentation

## 2011-02-06 DIAGNOSIS — K37 Unspecified appendicitis: Secondary | ICD-10-CM | POA: Insufficient documentation

## 2011-02-06 DIAGNOSIS — I10 Essential (primary) hypertension: Secondary | ICD-10-CM | POA: Insufficient documentation

## 2011-02-06 DIAGNOSIS — R002 Palpitations: Secondary | ICD-10-CM | POA: Insufficient documentation

## 2011-02-08 ENCOUNTER — Encounter: Payer: Self-pay | Admitting: Cardiovascular Disease

## 2011-02-08 ENCOUNTER — Ambulatory Visit (INDEPENDENT_AMBULATORY_CARE_PROVIDER_SITE_OTHER): Payer: Commercial Managed Care - PPO | Admitting: Cardiovascular Disease

## 2011-02-08 DIAGNOSIS — R079 Chest pain, unspecified: Secondary | ICD-10-CM

## 2011-02-08 NOTE — Assessment & Plan Note (Signed)
[name] Presents today with some vague chest pains. I suspect this is due to anxiety. She's had a lot of stress at work. 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  She does have some chest wall tenderness. We'll continue with her same medications. I'll see her in 3 months.    She empirically increased her Cortef for the past several days. She will taper back down to her normal dose very soon.

## 2011-02-08 NOTE — Progress Notes (Signed)
[name] Date of Birth  Mar 02, 1971 [facility] [address], [place]  [address] [phone]  Fax  [phone]  History of Present Illness:  Having chest pain, but also hurting all over.  Took extra Cortef yesterday and today.  Generally not feeling well.,  Not dyspneac.  + dizziness. CT scan of abd. Revealed no change of liver lesions and lung lesions.  Not thought to be cancerous. Took several days of diet pill ( Phentermain ( Atapex)).  Lost 6 lbs and did not have any significant palps.  Increasing fatigue recently Anemia- low ferritin level, Hb. Is 11. Advised to get IV iron in 1 month if Ferritin is not better.    Current Outpatient Prescriptions on File Prior to Visit  Medication Sig Dispense Refill  . clonazePAM (KLONOPIN) 0.5 MG tablet Take 0.5 mg by mouth 2 (two) times daily as needed.        Marland Kitchen dexlansoprazole (DEXILANT) 60 MG capsule Take 60 mg by mouth daily.        . diphenhydrAMINE (BENADRYL) 25 MG tablet Take 25 mg by mouth at bedtime.        . hydrocortisone (CORTEF) 10 MG tablet Take 10 mg by mouth daily. 10 MG IN THE AM, 5MG IN THE PM       . IBUPROFEN PO Take by mouth as needed.        Marland Kitchen levothyroxine (SYNTHROID, LEVOTHROID) 100 MCG tablet Take 100 mcg by mouth daily.        . metoprolol succinate (TOPROL-XL) 25 MG 24 hr tablet Take 25 mg by mouth daily.        . propranolol (INDERAL) 10 MG tablet Take 10 mg by mouth as needed.        . sertraline (ZOLOFT) 25 MG tablet Take 12.5 mg by mouth daily.        Marland Kitchen zolpidem (AMBIEN CR) 12.5 MG CR tablet Take 12.5 mg by mouth at bedtime.          Allergies  Allergen Reactions  . Ativan   . Compazine   . Cymbalta (Duloxetine Hcl)   . Dilaudid (Hydromorphone Hcl)   . Morphine And Related   . Other     TAPE ALLERGY  . Phenergan   . Reglan   . Tegaderm Ag Mesh (Silver)   . Zofran     Past Medical History  Diagnosis Date  . Hodgkin lymphoma    STATUS POST MANTLE RADIATION  . Breast cancer     STATUS POST BILATERAL MASTECTOMY. STATUS POST RECONSTRUCTION. SHE HAD SILICONE BREAST IMPLANTS AND THE LEFT IMPLANT IS LEAKING SLIGHTLY  . Thyroid cancer     STATUS POST SURGICAL REMOVAL-CURRENT ON THYROID REPLACEMENT  . Adrenal insufficiency   . Tachycardia   . Palpitations   . Chest pain   . Hypertension   . Raynaud phenomenon   . Appendicitis 12/19/09  . GERD (gastroesophageal reflux disease)     Past Surgical History  Procedure Date  . Pituitary surgery   . Cardiac catheterization 05/18/09    NORMAL CATH    History  Smoking status  . Never Smoker   Smokeless tobacco  . Not on file    History  Alcohol Use  . Yes    occassionally    No family history on file.  Reviw of Systems:  Reviewed in the HPI.  All other systems are negative.  Physical Exam: BP  102/70  Pulse 82  Ht _0  (1.651 m)  Wt 126 lb 9.6 oz (57.425 kg)  BMI 21.07 kg/m2 The patient is alert and oriented x 3.  The mood and affect are normal.  The skin is warm and dry.  Color is normal.  The HEENT exam reveals that the sclera are nonicteric.  The mucous membranes are moist.  The carotids are 2+ without bruits.  There is no thyromegaly.  There is no JVD.  The lungs are clear.  She is tender around her zyphoid process.  The heart exam reveals a regular rate with a normal S1 and S2.  There are no murmurs, gallops, or rubs.  The PMI is not displaced.   Abdominal exam reveals good bowel sounds.  There is no guarding or rebound.  There is no hepatosplenomegaly or tenderness.  There are no masses.  Exam of the legs reveal no clubbing, cyanosis, or edema.  The legs are without rashes.  The distal pulses are intact.  Cranial nerves II - XII are intact.  Motor and sensory functions are intact.  The gait is normal.  ECG: NSR. Normal Assessment / Plan:

## 2011-02-12 ENCOUNTER — Ambulatory Visit: Payer: Commercial Managed Care - PPO | Admitting: Cardiovascular Disease

## 2011-02-13 NOTE — Discharge Summary (Signed)
[name], [name]                ACCOUNT NO.:  [id number]   MEDICAL RECORD NO.:  [id number]          PATIENT TYPE:  OBV   LOCATION:  5476                         FACILITY:  [facility]   PHYSICIAN:  Thayer Headings, M.D. DATE OF BIRTH:  [birth year]   DATE OF ADMISSION:  12/20/2008  DATE OF DISCHARGE:  12/21/2008                               DISCHARGE SUMMARY   DISCHARGE DIAGNOSES:  1. Noncardiac chest pain.  2. Hypothyroidism.  3. Status post recent pituitary surgery.  4. History of breast cancer.  5. History of Hodgkin lymphoma - status post radiation therapy.  6. Probable esophageal reflux.  7. Recent urinary tract infection.  8. Anxiety.   DISCHARGE MEDICATIONS:  1. Aspirin 81 mg a day.  2. Synthroid 50 mcg a day.  3. Prevacid 30 mg at night.  4. Paxil 5 mg a day.  5. Metoprolol-XL 50 mg a day.  6. Ambien CR 12.5 mg at night.  7. Benadryl 25 mg a day.  8. Levaquin 500 mg a day for 10 days or her previously prescribed home      dose.  9. Pyridium as needed.   DISPOSITION:  The patient will see Dr. Acie Fredrickson in a week or so.  She is  to call the office and get an echocardiogram this week.   HISTORY:  [name] is a 40 year old female with a complex medical  history.  She has a history of Hodgkin lymphoma status post radiation  many years ago.  She had a history of breast cancer, presumably caused  by the radiation.  She was also recently found to have a pituitary tumor  and is status post removal of this pituitary tumor several months ago.  She has a history of gastroesophageal reflux.  She recently presented  with some xiphoid pain.  She had been recommended to start high-dose  Motrin.  She developed severe episodes of chest pain and presented to  the office yesterday.   Please see dictated H and P for further details.   HOSPITAL COURSE:  1. Chest pain.  The patient ruled out for myocardial infarction.  She      had some sinus tachycardia with nonspecific ST-T wave  changes on      admission but these have resolved the following day.  She is not      having any further episodes of chest pain.  It seems to be mostly      helped by Mylanta.  I assumed that this is due to esophagitis and      gastritis.  She will quite likely need to have an upper endoscopy      for further evaluation.  2. Urinary tract infection.  The patient continues to have some      dysuria and some urgency.  We will continue her Levaquin per her      home dose.  3. Hypothyroidism - stable.  4. History of pituitary.  The patient had a normal sodium and normal      potassium level.  I do not think that she has adrenal  insufficiency.  She was found to have a normal but low normal      cortisol level.  This will be followed up by her endocrinologist.      All of her other medical problems are stable.      Thayer Headings, M.D.  Electronically Signed     PJN/MEDQ  D:  12/21/2008  T:  12/21/2008  Job:  [id number]

## 2011-02-13 NOTE — Cardiovascular Report (Signed)
NAMESAFFRON, BUSEY                ACCOUNT NO.:  [id number]   MEDICAL RECORD NO.:  [id number]          PATIENT TYPE:  OIB   LOCATION:  1962                         FACILITY:  [facility]   PHYSICIAN:  Thayer Headings, M.D. DATE OF BIRTH:  April 10, 1971   DATE OF PROCEDURE:  05/18/2009  DATE OF DISCHARGE:                            CARDIAC CATHETERIZATION   [name] is a 40 year old female with a history of Hodgkin lymphoma.  She is status post radiation therapy.  She subsequently had breast  cancer, thyroid cancer, and a pituitary tumor.  She was recently  diagnosed as having adrenal failure.  She continues to have episodes of  chest pain.  We have scheduled her for heart catheterization for further  evaluation.   PROCEDURE:  Right and left heart catheterization.   The right femoral artery and right femoral vein were easily cannulated  using a modified Seldinger technique.   HEMODYNAMICS:  RA pressure is 3, RV pressure is 35/7, the pulmonary  capillary wedge pressure is mean of 10, pulmonary artery pressure is  25/11 with a mean of 17, left ventricular pressure is 137/17, and the  aortic pressure is 133/85.   Hemoglobin was 13.7.  Arterial saturation is 95%, PA saturation 77%.  Cardiac output by thermodilution is 5.4 with an index of 3.2.  Cardiac  output by Fick calculation is 6.2 with a cardiac index of 3.7.   There was a slight pulmonic valve gradient measured.  There was no  pulmonic stenosis seen by echo.  In addition, there was no resistance to  the Swan-Ganz catheter passing out the right ventricular outflow track  down out into the pulmonary artery.  I suspect that this measured  pulmonic valve gradient is entered.  There was a mild aortic valve  gradient measured.   ANGIOGRAPHY:  Left main:  The left main is smooth and normal.   The left anterior descending artery is smooth and normal.  There is a  large diagonal branch, which is also normal.   The circumflex artery is  a large and normal vessel.  The obtuse marginal  artery is normal.  The right coronary artery is smooth and normal.  It  gives off a posterior descending artery and a small posterolateral  branch, both of which are normal.  The left ventriculogram is performed  in the 30-degree RAO position.  It reveals normal left ventricular  systolic function with an ejection fraction of 65%.   Left ventriculogram is performed in the 30-degree RAO position.  It  reveals overall normal left ventricular systolic function.  There is a  trace amount of mitral regurgitation.  There is mitral regurgitation  tracing could also be seen on the wedge pressure measurements.   COMPLICATIONS:  None.   CONCLUSION:  1. Smooth and normal coronary arteries.  2. Essentially normal right-sided heart pressures.  There is a small      pulmonic valve gradient measured, although I do not think that this      is significant.  She certainly has no cardiac etiology for her      chest  pain.      Thayer Headings, M.D.  Electronically Signed     Thayer Headings, M.D.  Electronically Signed    PJN/MEDQ  D:  05/18/2009  T:  05/18/2009  Job:  [id number]   cc:   Jacelyn Pi, M.D.

## 2011-02-13 NOTE — H&P (Signed)
[name], [name]                ACCOUNT NO.:  [id number]   MEDICAL RECORD NO.:  [id number]         PATIENT TYPE:  DOUT   LOCATION:  CARD                           FACILITY:   PHYSICIAN:  Thayer Headings, M.D. DATE OF BIRTH:  09-Nov-1970   DATE OF ADMISSION:  05/05/2009  DATE OF DISCHARGE:                              HISTORY & PHYSICAL   Pietra is a middle-aged female with a history of breast cancer, Hodgkin  lymphoma, and pituitary surgery and thyroid cancer.  She is now admitted  with episodes of chest pain and is scheduled to have a heart  catheterization.   [name] has a long history of medical problems.  She has a history of  Hodgkin lymphoma and is status post chest radiation for that.  She  developed breast cancer several years later.  She also subsequently has  developed thyroid cancer and pituitary cancer.  She presents with lots  of episodes of chest pain.  We performed a stress nuclear study, which  was negative for ischemia.  We have scheduled her originally for heart  catheterization a month ago but she cancelled it because of anxiety  issues and she thought that she was getting better.  She now presents  with recurrent episodes of chest discomfort and would like to have a  heart catheterization.  She is continued to have intermittent episodes  of chest pain.  This is described as midsternal chest tightness.  The  intensity is 10/10.  It lasts for several hours.  It is associated with  some shortness breath.  There is no diaphoresis.  She denies any syncope  or presyncope.   CURRENT MEDICATIONS:  1. Ambien at night.  2. Synthroid 112 mcg a day.  3. Metoprolol 50 mg in the morning with 25 mg at night.  4. Kapidex once a day.  5. Prednisone 2.5 mg 3 times a day.   She is allergic to [facility] and REGLAN.  She is intolerant to ATIVAN,  ZOFRAN, COMPAZINE, and CYMBALTA.   PAST MEDICAL HISTORY:  History of non-Hodgkin lymphoma at age 48.  She  had mantle radiation.   Since that time, she had breast cancer which was  diagnosed at age 54.  She has had cervical cancer.  She has had thyroid  cancer.  She has had pituitary surgery for pituitary tumor.  She had  palpitations and chest pain.  She also has adrenal insufficiency.  It is  not clear whether she has complete adrenal failure.  She does seem to be  fairly reliable on exogenous steroids and prednisone.   SOCIAL HISTORY:  The patient is a nonsmoker.  She does not drink alcohol  to excess.   FAMILY HISTORY:  The patient is adopted and does not know her family  history.   REVIEW OF SYSTEMS:  She denies any problems with fevers or chills.  She  has had some overall and generalized aches and pains.  She feels quite  poorly when her prednisone dose is decreased.  She denies any problems  with her eyes, ears, nose, and throat.  She denies any  change of bowel  habits.  She denies any blood in her urine or blood in stool.  She  denies any bleeding disorders.  There is no thrush.  All other systems  were reviewed and are negative.   PHYSICAL EXAMINATION:  GENERAL:  She is a middle-aged female, in no  acute distress.  She is alert and oriented x3, and her mood and affect  are normal.  VITAL SIGNS:  Her weight is 135.  Her blood pressure is 110/70.  Her  heart rate is 64.  HEENT:  Her sclerae are nonicteric.  Her mucous membranes are moist.  NECK:  Supple.  Her carotids are 2+ without bruits.  There is no JVD.  LUNGS:  Clear.  HEART:  Regular rate.  S1 and S2.  PMI is nondisplaced.  There are no  murmurs, gallops, or rubs.  ABDOMEN:  Good bowel sounds.  There was no hepatosplenomegaly.  She has  no guarding or rebound.  EXTREMITIES:  She has no clubbing, cyanosis, or edema.  Her pulses are  intact.  NEUROLOGIC:  Nonfocal.  Her gait is normal.   Her EKG reveals normal sinus rhythm.  She has right atrial enlargement.   [name] presents with persistent episodes of chest discomfort.  She has  had chest  wall radiation and it is quite possible that she has radiation  damage, which has caused premature coronary artery disease.  She  certainly has many other abnormalities that have been caused by her  chest radiation.  We have discussed the risks, benefits, and options of  heart catheterization.  She understands and agrees to proceed.  All of  her other medical problems were stable.      Thayer Headings, M.D.  Electronically Signed     PJN/MEDQ  D:  05/06/2009  T:  05/07/2009  Job:  [id number]

## 2011-02-13 NOTE — H&P (Signed)
[name], [name]                ACCOUNT NO.:  [id number]   MEDICAL RECORD NO.:  [id number]          PATIENT TYPE:  OBV   LOCATION:  0160                         FACILITY:  [facility]   PHYSICIAN:  Thayer Headings, M.D. DATE OF BIRTH:  1971/06/09   DATE OF ADMISSION:  12/20/2008  DATE OF DISCHARGE:  12/21/2008                              HISTORY & PHYSICAL   [name] is a middle-aged female with a history of breast cancer,  Hodgkin lymphoma, and recent pituitary surgery.  She was admitted to the  hospital with episodes of chest pain.   [name] is a 40 year old female with multiple medical problems.  She had  Hodgkin lymphoma 23 years ago.  She had radiation therapy for that.  She  subsequently developed breast cancer, and although has had cervical  cancer.  She has had a pituitary mass and had surgery for that last  month or so.  She has not felt well since that pituitary surgery.  She  has been seen by her endocrinologist and has been found to have  relatively normal lab values.  She also has a complicating factor.  She  has also recently been found to have some thyroid abnormalities and  thinks that she may have thyroid cancer as well.  She started having  episodes of chest pain a day or so ago, but they are acutely worsened  today.  These episodes of chest pain occur in her upper chest and lower  throat.  They come in wakes.  Do not associate with any specific  activity such as eating, drinking, change position, or taking a deep  breath.  She specifically denies any pleuritic chest pain.  She has not  noticed any worsening with exertion.  These pains have been quite  severe.  She has taken several extra Prevacid today with no real relief.  She denies any syncope or presyncope.  She is fairly anxious.  She  denies any heat or cold intolerance.  She has had some episodes of  diarrhea.   All other systems were reviewed and are negative.   CURRENT MEDICATIONS:  1. Prevacid 30 mg  twice a day.  2. Aspirin 81 mg a day.  3. Levothyroxine 50 mcg a day.  4. Ambien CR 12.5 mg at bedtime.  5. Metoprolol 50 mg a day.  6. Zithromax as directed.  7. Levaquin 5 mg a day.   ALLERGIES:  She has an allergy to ATIVAN and some other medications that  cause seizures.   PAST MEDICAL HISTORY:  1. History of Hodgkin lymphoma.  2. History of breast cancer, she is status post bilateral mastectomies      and has had reconstruction.  3. History of cervical cancer.  4. Hypercholesterolemia.  5. Hypertension.  6. Thyroid cancer - recently diagnosed.  7. 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   FAMILY HISTORY:  The patient is adopted and does not know her family  history.   SOCIAL HISTORY:  The patient is a stay-at-home.  She does not exercise.  She does not smoke.  She drinks alcohol occasionally.  REVIEW OF SYSTEMS:  Reviewed.   HISTORY OF PRESENT ILLNESS:  All other systems were reviewed and are  negative.   PHYSICAL EXAMINATION:  GENERAL:  She is a middle-aged female, in no  acute distress.  VITAL SIGNS:  Her heart rate is around 110.  Her blood pressure is  140/80.  HEENT:  Carotids 2+.  She has no bruits.  No JVD.  She has mild  thyromegaly.  Small goiter.  LUNG:  Clear.  HEART:  Regular rate.  S1 and S2.  She has a soft systolic murmur.  ABDOMINAL:  Good bowel sounds and is nontender.  She has no  hepatosplenomegaly.  There is no guarding or rebound.  EXTREMITIES:  She has no clubbing, cyanosis, or edema.  SKIN:  Warm and dry.  Gait is normal.  NEUROLOGIC:  Nonfocal.   Her EKG reveals sinus tachycardia at 104 beats a minute.  She has  nonspecific ST and T-wave changes.   We gave [name] a nitroglycerin, but this did not really seem to cause  any significant relief with the chest pain.   [name] presents with multiple medical issues.  She has had these  episodes of chest pain.  She do has some questionable relief with a  nitroglycerin, although was  not all that convincing.  At this point, I  would like to admit her to the hospital.  She may have early gastritis  or esophagitis.  We will get cardiac enzymes.  If she rules out, we  should be able to send her home in the morning.  We may need to get an  upper endoscopy.  We will also want to get an echocardiogram at some  point.  All of her other medical problems are stable.      Thayer Headings, M.D.  Electronically Signed     PJN/MEDQ  D:  12/21/2008  T:  12/22/2008  Job:  [id number]

## 2011-02-16 NOTE — Discharge Summary (Signed)
NAMELACRESHIA, [name]                ACCOUNT NO.:  [id number]   MEDICAL RECORD NO.:  [id number]          PATIENT TYPE:  INP   LOCATION:  9311                          FACILITY:  [facility]   PHYSICIAN:  [place] B. Rosana Hoes, M.D.  DATE OF BIRTH:  1970/11/06   DATE OF ADMISSION:  07/10/2005  DATE OF DISCHARGE:  07/18/2005                                 DISCHARGE SUMMARY   ADMISSION DIAGNOSES:  1.  23 plus week intrauterine pregnancy.  2.  Low lying placenta.  3.  Chronic abruption.  4.  Preterm contractions.   DISCHARGE DIAGNOSES:  1.  23 plus week intrauterine pregnancy.  2.  Low lying placenta.  3.  Chronic abruption.  4.  Preterm contractions.  5.  Preterm birth and preterm infant death after delivery.   HISTORY OF PRESENT ILLNESS:  A 40 year old white female with a long and  complicated obstetric history which includes a 28 week preterm birth and  multiple first trimester pregnancy losses with negative antiphospholipid  antibody workup and history of uterine septum resection. She has had  bleeding since approximately 16 weeks when an abruption was identified  clinically and by ultrasound. She was managed as an outpatient until about  23 weeks when viability was possible and she developed increased bleeding.  Also history of chronic hypertension and hypothyroidism which had been  managed successfully as an outpatient as well. The patient was admitted with  increasing bleeding and contractions for close surveillance and steroids to  promote fetal lung maturity.   HOSPITAL COURSE:  The patient was admitted to the antepartum service,  administered betamethasone and maintained on modified bedrest and  intermittent fetal monitoring. The patient had episodes of bleeding which at  times were heavier than others but overall maintained stability clinically.  However on the eighth hospital day at 24 weeks and 2 days, the patient  experienced premature rupture of membranes and precipitously  labored and  delivered a living 540 gram fetus. However, there was associated cord  prolapse and despite resuscitative efforts from the NICU team resuscitation  was not successful.   After delivery, the patient was stable with stable vital signs. She was  discharged on the day of delivery. She was discharged approximately 9 hours  after delivery in stable condition. A long discussion was had by myself at  the patient's bedside with husband present regarding the various types of  support that could be offered. The patient declined a visit from pastoral  care, social work, Engineer, water and the heart strings representative. The  patient requested to be discharged home stating she would speak with her  pastor and that her husband would be supportive. She requested a  prescription for Ambien to help her sleep which she had been taking previous  to admission. The patient's husband agrees to maintain security of the  bottle in between her evening doses. The patient is again reminded that all  the above measures are available for her support should she need them as  well as the option of [place] if emergency is perceived by  her or  her husband from a psychological standpoint.   DISCHARGE INSTRUCTIONS:  To have preprinted instructions given prior to  dismissal.   DISCHARGE MEDICATIONS:  The patient is instructed to resume all home  medications per the med reconciliation form. I additionally prescribed  Ambien 10 mg p.o. q.h.s. p.r.n. sleep.   FOLLOW UP:  [facility] with Dr. Rosana Hoes in 2 weeks.   DISPOSITION:  Stable.      [place] B. Rosana Hoes, M.D.  Electronically Signed     WBD/MEDQ  D:  07/18/2005  T:  07/18/2005  Job:  [id number]

## 2011-02-16 NOTE — H&P (Signed)
White, Susan                ACCOUNT NO.:  [id number]   MEDICAL RECORD NO.:  [id number]          PATIENT TYPE:  EMS   LOCATION:                               FACILITY:  [facility]   PHYSICIAN:  Thayer Headings, M.D. DATE OF BIRTH:  04/08/1971   DATE OF ADMISSION:  03/15/2009  DATE OF DISCHARGE:                              HISTORY & PHYSICAL   [name] is a middle-aged female with a history of breast cancer,  Hodgkin lymphoma, and pituitary surgery.  She is admitted now to the  hospital with chest pain.   [name] has a long history of chest pain for the past several months.  She has had a stress nuclear study which was negative for ischemia.   Despite that, she continues to have episodes of chest discomfort.  These  episodes described as intense chest pressure associated with  palpitations.  They are associated with some diaphoresis.  They cause  her to be very short of breath.  They last for hours at a time.  She has  lots of other medical issues and has been under lots of stress.  She was  admitted to the hospital with chest pain in March of this year and ruled  out for myocardial infarction.   [name] has multiple medical problems including Hodgkin lymphoma at age  80.  Ten years after that, she developed breast cancer presumably due to  the radiation that she had for treatment of her Hodgkin's.  She then has  had cervical cancer.  More recently, she has had pituitary surgery and  thyroid surgery.  She has had lots of the endocrine abnormalities and in  fact had a Cortrosyn stim test today.  She denies any syncope or  presyncope.  She is clearly under lots of stress.  She has had a little  bit of weight loss.  She has not had any heat intolerance or cold  intolerance.  She denies any cough or sputum production.  She denies any  blood in her urine or blood in her stool.  All other systems were  reviewed and are negative.   CURRENT MEDICATIONS:  1. Aspirin 81 mg a day.  2.  Synthroid 112 mcg a day.  3. Ambien CR 10 mg a day.  4. Metoprolol 25 mg p.o. b.i.d.  5. Benadryl at bedtime.  6. Casodex 60 mg a day.  7. Prednisone 5 mg a day.   She is intolerant to PHENERGAN, REGLAN, [place], ZOFRAN, [place], and  CYMBALTA.   PAST MEDICAL HISTORY:  1. History of Hodgkin lymphoma at age 23.  2. History of breast cancer at age 67.  79. History of cervical cancer.  4. History of pituitary surgery 1 year ago.  5. History of thyroid surgery earlier this year.  6. History of chest pains.  7. History of palpitations.   SOCIAL HISTORY:  The patient is a nonsmoker.  She does not drink  alcohol.   FAMILY HISTORY:  The patient is adopted and does not know her family  history.   REVIEW OF SYSTEMS:  Reviewed in the  HPI.  All other systems are  negative.   PHYSICAL EXAMINATION:  GENERAL:  She is a middle-aged female in no acute  distress.  She is alert and oriented x3, and mood and affect are normal.  VITAL SIGNS:  Her weight is 138 which is down 2 pounds.  Her blood  pressure is 138/70 with heart rate of 78.  HEENT:  2+ carotids.  She has no bruits, no JVD, no thyromegaly.  Her  sclerae are nonicteric.  Her mucous membranes are moist.  NECK:  Supple.  LUNGS:  Clear.  HEART:  Regular rate, S1, S2.  Her PMI is nondisplaced.  ABDOMINAL:  Good bowel sounds and is nontender.  EXTREMITIES:  She has no clubbing, cyanosis, or edema.  There is no rash  or skin nodules.  Her pulses are intact.  NEUROLOGIC:  Nonfocal.  Her gait is normal.   [name] presents with episodes of chest discomfort as well as  palpitations.  She has had lots of radiation to her chest and it is  certainly possible that she may have developed some coronary artery  disease based on this radiation.  I would like to proceed with a heart  catheterization for further evaluation.  We have discussed the risks,  benefits, and options of heart catheterization.  She understands and  agrees to proceed.  We  will plan on scheduling her in the outpatient  lab.  All of her other medical problems remain stable.      Thayer Headings, M.D.  Electronically Signed     PJN/MEDQ  D:  03/30/2009  T:  03/31/2009  Job:  [id number]

## 2011-02-16 NOTE — Op Note (Signed)
[name], [name]                ACCOUNT NO.:  [id number]   MEDICAL RECORD NO.:  [id number]          PATIENT TYPE:  INP   LOCATION:  9499                          FACILITY:  [facility]   PHYSICIAN:  Lovenia Kim, M.D.DATE OF BIRTH:  30-Apr-1971   DATE OF PROCEDURE:  03/12/2006  DATE OF DISCHARGE:                                 OPERATIVE REPORT   PREOPERATIVE DIAGNOSES:  1.  A 24-5/7 weeks intrauterine pregnancy.  2.  Severe preeclampsia.  3.  Nonreassuring fetal heart rate.   POSTOPERATIVE DIAGNOSES:  1.  A 24-5/7 weeks intrauterine pregnancy.  2.  Severe preeclampsia.  3.  Nonreassuring fetal heart rate.   PROCEDURE:  Low vertical cesarean section and tubal ligation.   SURGEON:  Lovenia Kim, M.D.   ASSISTANT:  Freddie Apley, M.D.   ANESTHESIA:  Spinal by Glennon Mac.   ESTIMATED BLOOD LOSS:  100 cc.   COMPLICATIONS:  None.   DRAINS:  Foley.   COUNTS:  Correct.   Patient to recovery room in good condition.   OPERATIVE NOTE:  After being apprised of risks of anesthesia, infection,  bleeding, intra-abdominal organs need for repair, delayed versus immediate  complications, to include bowel and bladder injury, failure of risk of tubal  ligation, 5-07/999, risk of fetal demise with extreme prematurity.  She is  brought to the operating room, where she is administered spinal anesthetic  without complications.  Prepped and draped in the usual sterile fashion.  A  Foley catheter placed.  After achieving adequate anesthesia and fluent  Marcaine solution placed, a Pfannenstiel skin incision was made with the  scalpel.  The fascia is opened transversely using Mayo scissors.  The rectus  muscles dissected sharply, and the midline peritoneum entered sharply.  Bladder blade placed.  The bladder flap is sharply dissected off the lower  uterine segment and low vertical incision made.  Amniotomy of clear fluid,  atraumatic delivery of a premature 490 gm female fetus from a  footling [place]  position with appropriate maneuvers.  Handed to the pediatrician in  attendance.  Apgars of 3, 6, and 7.  Cord pH and cord blood collected.  The  placenta was manually intact and sent for pathology.  The uterus was  exteriorized and curetted using a dry lap pack and closed in two layers  using a 0 Monocryl continuous running stitch.  A second imbricating running  stitch is placed.  The right tube was traced out to the fimbriated end, and  the ampullary estimated portion of the tube is identified.  The avascular  portion of the mesosalpinx is cauterized, creating a window.  Then 0 ties  are placed proximally and distally.  The same procedure is done on the right  tube as done on the left tube.  Both segments are excised.  At this time,  irrigation is accomplished.  Good hemostasis is noted.  Sutures are placed  for hemostasis in the right lateral portion of the incision.  At this time,  after good hemostasis and inspecting the tubal segments, which appear to be  hemostatic, the fascia  is closed using a 0 Monocryl suture in a running  fashion.  The skin is closed using staples.  The patient tolerated the  procedure well and is transferred to recovery in guarded condition.      Lovenia Kim, M.D.  Electronically Signed     RJT/MEDQ  D:  03/12/2006  T:  03/12/2006  Job:  [id number]

## 2011-02-16 NOTE — Op Note (Signed)
   NAME:  [name], [name]                          ACCOUNT NO.:  [id number]   MEDICAL RECORD NO.:  [id number]                   PATIENT TYPE:  AMB   LOCATION:  [place]                                  FACILITY:  [facility]   PHYSICIAN:  [place] B. Rosana Hoes, M.D.               DATE OF BIRTH:  March 29, 1971   DATE OF PROCEDURE:  08/03/2003  DATE OF DISCHARGE:                                 OPERATIVE REPORT   PREOPERATIVE DIAGNOSE:  1. A 7-week missed abortion.  2. Uterine septum.   POSTOPERATIVE DIAGNOSES:  1. A 7-week missed abortion.  2. Uterine septum.   PROCEDURE:  Suction curettage, ultrasound guided.   SURGEON:  Blair Dolphin. Rosana Hoes, M.D.   ANESTHESIA:  MAC and 10 mL of 2% lidocaine paracervical block.   SPECIMENS:  Products of conception.   ESTIMATED BLOOD LOSS:  Less than 50 mL.   COMPLICATIONS:  None.   INDICATIONS FOR PROCEDURE:  The patient with history of a known uterine  septum who transferred her care from [place], [place], at  approximately six to seven weeks pregnant.  She presented to the office  yesterday with spotting.  Ultrasound confirmed absent fetal cardiac  activity.  The patient desired operative management and presented today for  suction curettage.  Given her uterine septum, I have recommended ultrasound  guidance during the procedure.   DESCRIPTION OF PROCEDURE:  The patient was taken to the operating room and  MAC anesthesia obtained.  She was placed in the [place], prepped and  draped in sterile fashion.  Bladder was emptied with red rubber catheter.  Examination under anesthesia showed a seven-week retroverted uterus.   Speculum inserted.  Paracervical block placed.  Single-tooth attached to the  anterior lip of the cervix.   The cervix was easily dilated to #21.  The #7 suction cannula was inserted  and placed to the right side of the septum under ultrasound guidance.  No  products returned. Therefore, the curette was used and placed in similar  fashion which shows real time drainage of the sac and contents removed  sharply.  Several passes required to clear the uterus.  Suction cannula was  passed again and no additional tissue returned.  Ultrasound confirmed  complete evacuation.   The patient tolerated the procedure well and there were no complications.  She was taken to the recovery room awake, alert and in stable condition.                                               [place] B. Rosana Hoes, M.D.    WBD/MEDQ  D:  08/03/2003  T:  08/03/2003  Job:  [id number]

## 2011-02-16 NOTE — Discharge Summary (Signed)
NAMECHARMAN, BLASCO                ACCOUNT NO.:  [id number]   MEDICAL RECORD NO.:  [id number]          PATIENT TYPE:  INP   LOCATION:  9119                          FACILITY:  [facility]   PHYSICIAN:  Lovenia Kim, M.D.DATE OF BIRTH:  1971-08-21   DATE OF ADMISSION:  03/08/2006  DATE OF DISCHARGE:  03/15/2006                                 DISCHARGE SUMMARY   The patient was admitted on March 08, 2006 at [redacted] weeks gestation with cervical  incompetence and questionable chronic hypertension that was superimposed on  preeclampsia. She was managed expectantly, started on labetalol, Motrin and  given terbutaline as needed for preterm contractions. Cerclage was stable.  She received betamethasone for improvement of fetal lung maturity. She  remained stable on labetalol as needed until hospital day #4 at which point  it was noted that there were superimposed mild preeclampsia and a  nonreassuring fetal heart rate tracing with abnormal fetal Dopplers in lieu  of the nonreassuring fetal heart rate tracing and abnormalities in the  Dopplers. A decision was made to proceed with delivery despite extreme  prematurity. The patient underwent a complicated repeat low vertical C  section and tubal ligation on March 12, 2006.   POSTOPERATIVE COURSE:  The patient was placed on magnesium sulfate and  improved gradually with good urine output and resolution of elevation of her  blood pressure. She was discharged to home on postoperative day #3 with  stable blood pressures.   DISCHARGE MEDICATIONS:  Procardia, thyroid replacement therapy, prenatal  vitamins and iron. She is to followup in the office for staple removal  within one day. Discharge teaching done, preeclamptic precautions given.      Lovenia Kim, M.D.  Electronically Signed     RJT/MEDQ  D:  04/23/2006  T:  04/24/2006  Job:  [id number]

## 2011-02-16 NOTE — Consult Note (Signed)
[name], [name]                ACCOUNT NO.:  [id number]   MEDICAL RECORD NO.:  [id number]          PATIENT TYPE:  MAT   LOCATION:  [facility]                          FACILITY:  [facility]   PHYSICIAN:  Lovenia Kim, M.D.DATE OF BIRTH:  [birth year]   DATE OF CONSULTATION:  DATE OF DISCHARGE:                                   CONSULTATION   CHIEF COMPLAINT:  Nausea and contractions.   HISTORY OF PRESENT ILLNESS:  The patient is a 40 year old white female G6,  P0, 1, 4, 1, who presents with increased frequency of contractions and  nausea.   MEDICATIONS:  [place], Procardia for hypertension, thyroid replacement  therapy, and weekly progestational therapy, and prenatal vitamins.   ALLERGIES:  She has allergies to multiple medications to include:  PHENERGAN, REGLAN, ATIVAN, ZOFRAN, COMPAZINE, AND TEGADERM.   SOCIAL HISTORY:  She is a nonsmoker and non drinker. She denies domestic  physical violence.   HISTORY:  She has a history of an emergency C-section at 29 weeks for  placenta previa and preeclampsia. She has a history of a complete SAB x3 in  2005 with 1 D&C, and missed  AB with D&C in 2004. She has a history of anemia. She has a history of a  uterine septum status post hysteroscopic removal. History of abnormal Pap  smear with biopsy. History of C-section as noted. She has a history of  Hodgkin's disease status post chemotherapy and radiation. She has a history  of breast cancer status post mastectomy and chemotherapy 10 years now in  remission. She has a history of a thyroid mass with biopsy which appears to  be benign. She has a history of left knee arthroscopic ACL repair. She also  has a history of chronic hypertension.   PHYSICAL EXAMINATION:  GENERAL: She is a well-developed, well-nourished  white female in no acute distress.  VITAL SIGNS:  Blood pressure is ranging from 150 to 160's over 80's to 170.  HEENT:  Normal.  LUNGS:  Clear.  HEART:  Regular rhythm.  ABDOMEN:   Soft, gravid and nontender to palpation.  PELVIC:  Cervix was closed, 3 cm long, firm.  EXTREMITIES:  Show no cords.  NEUROLOGIC EXAM:  Nonfocal.   IMPRESSION:  1.Twenty-week intrauterine pregnancy.  1.  Status post Hodgkin's disease in remission.  2.  Status post breast cancer in remission.  3.  History of multinodular goiter.  4.  Chronic hypertension.  5.  Pregnancy associated nausea and vomiting with allergies to all      antiemetic medications.   PLAN:  The patient has responded well to Stadol and IV fluid hydration,  Pepcid IV. She is to be discharged to home on medications as standing.  Follow up in the office within 1 week.      Lovenia Kim, M.D.  Electronically Signed     RJT/MEDQ  D:  06/23/2005  T:  06/23/2005  Job:  [id number]   cc:   Erling Conte

## 2011-02-16 NOTE — Op Note (Signed)
NAMEARMIE, MOREN                ACCOUNT NO.:  [id number]   MEDICAL RECORD NO.:  [id number]          PATIENT TYPE:  AMB   LOCATION:  [place]                           FACILITY:  [facility]   PHYSICIAN:  Lovenia Kim, M.D.DATE OF BIRTH:  09-Mar-1971   DATE OF PROCEDURE:  12/27/2005  DATE OF DISCHARGE:                                 OPERATIVE REPORT   PREOPERATIVE DIAGNOSES:  This is a 13-week intrauterine pregnancy history  and pre-term delivery and history of cervical insufficiency.   POSTOPERATIVE DIAGNOSES:  This is a 13-week intrauterine pregnancy history  and pre-term delivery and history of cervical insufficiency.   PROCEDURE:  McDonald cervical cerclage.   SURGEON:  Lovenia Kim, M.D.   ASSESSMENT:  Diona Foley, M.D.   ANESTHESIA:  MAC, paracervical block.   ESTIMATED BLOOD LOSS:  Was 50 mL.   COMPLICATIONS:  None.   DRAINS:  None.   COUNTS:  Correct.   DISPOSITION:  The patient to the recovery room in good condition.   DESCRIPTION OF PROCEDURE:  Being appraised of the risks of anesthesia,  infection, bleeding, injury to bowel and bladder with a possible need for  repair, small incidents of pregnancy loss, the patient was brought to the  operating room where she was administered IV sedation and paracervical  block.  Placed in the standard fashion and 20 mL of dilute 1% Xylocaine  solution.  Then #5-0 Ethibond suture placed on a free needle.  A McDonald  cerclage placed in the cervical/vaginal junction, taking a bite from 1  o'clock, 10 o'clock, 10 o'clock, to 7 o'clock, 7 o'clock, to  5 o'clock, and 5 o'clock, back to 1 o'clock.  Sutures tied over a free  Prolene tie.  Good hemostasis is noted.  Fetal heart tones heard pre and  post-procedure.   The patient tolerated the procedure well and was transferred to the recovery  room in good condition.      Lovenia Kim, M.D.  Electronically Signed     RJT/MEDQ  D:  12/27/2005  T:  12/28/2005  Job:   [id number]

## 2011-02-16 NOTE — H&P (Signed)
[name], [name]                ACCOUNT NO.:  [id number]   MEDICAL RECORD NO.:  [id number]          PATIENT TYPE:  INP   LOCATION:  0045                          FACILITY:  [facility]   PHYSICIAN:  Lovenia Kim, M.D.DATE OF BIRTH:  [birth year]   DATE OF ADMISSION:  03/08/2006  DATE OF DISCHARGE:                                HISTORY & PHYSICAL   CHIEF COMPLAINT:  Preterm cervical change.   HISTORY OF PRESENT ILLNESS:  She is a 40 year old white female, G7, P0-2-4-  1, at [redacted] weeks gestation for admission due to funneling noted on ultrasound,  in the office today with cerclage intact, no bleeding, irregular  contractions noted. Prenatal care has been complicated by preterm cervical  change. No evidence of bleeding or recurrent bacterial vaginosis.  She has  an past medical and obstetric history remarkable for an emergency cesarean  section a 29.5 weeks and a precipitous VBAC at 23 weeks in 2006 for  recurrent pregnancy loss, hypothyroidism, chronic hypertension, history of  mastectomy with reconstruction. She had been on weekly progestational  therapy which she has been declined at this time. Anatomical survey was  within normal limits, normal placentation was noted. Prenatal care reveals  normal prenatal labs, hepatitis and HIV negative, blood type Rh positive,  RPR negative, and Pap smear within normal limits. GC and chlamydia negative.   ALLERGIES:  ZOFRAN, COMPAZINE, PHENERGAN, REGLAN, TEGADERM, TAPE, and  ATIVAN.   MEDICATIONS:  Levothyroxine, Prevacid, prenatal vitamins.   PAST MEDICAL HISTORY:  1.  Chronic hypertension.  2.  Hypothyroidism.  3.  Multinodular goiter noted.  4.  History of Hodgkins' disease at age 38 with chemo.  5.  History of right breast cancer treated with cytoxan, with reconstruction      in 2000.   PAST SURGICAL HISTORY:  1.  D&C.  2.  Low vertical cesarean section.  3.  Right mastectomy with reconstruction.  4.  Left ACO repair.  5.   Hysteroscopic resection of uterine septum.  6.  Lung and liver biopsy.   OBSTETRIC HISTORY:  As noted consistent with vaginal delivery of a 23-week  fetus with secondary demise, an emergency 29-week low vertical C-section in  1997, missed AB in 2004, SAB in 2007, 2005, September 2005, and November  2005.   FAMILY HISTORY:  She is adopted. She is a nonsmoker, nondrinker. She denies  domestic or physical violence.   PHYSICAL EXAMINATION:  GENERAL: She is a well-developed, well-nourished  white female in a moderate amount of distress.  HEENT: Normal.  LUNGS: Clear.  HEART: Regular rate and rhythm.  ABDOMEN: Soft, gravid, nontender to palpation. No right upper quadrant  tenderness. DTRs are 3+.  No evidence of clonus noted. Cervical exam reveals  cervix to be closed, 2.5 cm long, posterior with cerclage intact, noted at  1:00.  Fetal heart tones in the 140 to 150 range with good to beat to beat  variability noted, contractions on admission every two to three minutes,  responded to a subcu terbutaline times one.   IMPRESSION:  1.  A 24-week intrauterine pregnancy.  2.  Chronic hypertension with exacerbation. Normal pregnancy-induced      hypertension. Laboratories today and 24-hour pending.  3.  Previous low vertical cesarean section.  4.  Multinodular goiter on thyroid replacement therapy.  5.  History of preterm birth.   PLAN:  Admit. Terbutaline p.r.n. Will start labetalol 100 mg p.o. b.i.d.,  ibuprofen 600 mg q.6h. times 24 hours,  betamethasone 12.5 mg IM to repeat  in 24 hours. Check 24-hour urine. Will monitor closely at this time and  recheck cervix, pending symptomatology.      Lovenia Kim, M.D.  Electronically Signed     RJT/MEDQ  D:  03/08/2006  T:  03/08/2006  Job:  [id number]

## 2011-02-16 NOTE — Consult Note (Signed)
NAMECAYDEN, RAUTIO                ACCOUNT NO.:  [id number]   MEDICAL RECORD NO.:  [id number]          PATIENT TYPE:  INP   LOCATION:  9153                          FACILITY:  [facility]   PHYSICIAN:  Lillette Boxer. Dahlstedt, M.D.DATE OF BIRTH:  [birth year]   DATE OF CONSULTATION:  07/15/2005  DATE OF DISCHARGE:                                   CONSULTATION   REASON FOR CONSULTATION:  Right flank pain.   BRIEF HISTORY:  This is a 40 year old married female who is in her 24th week  of pregnancy that was initiated by in-vitro fertilization.  Estimated date  of confinement is November 05, 2005.  She was admitted 5 days ago with  vaginal bleeding.  She also had contractions.  She has a history of placenta  previa and chronic abruption.  She has been stable in the hospital.  She  developed severe onset of right flank pain last night. This was colicky in  nature, radiated anteriorly to the right lower quadrant.  It was not  associated with any nausea or vomiting.  She denies any fever or chills.  She did have some urgency at first and some feeling of incomplete emptying.  Since she has been increasing fluid intake orally, this has been less of an  issue.  She has had no dysuria.  She has no gross hematuria.  She has had  some persistent vaginal bleeding/clots.   She was told earlier in the year, having seen Dr. Jeffie Pollock for back pain, that  she had small stones (2) in each kidney.  She has never passed a stone to  her knowledge.  She has no family history of stones.  She has no prior  urologic history.   Renal ultrasound was performed earlier.  This showed moderate right  pyelocaliectasis. The left kidney appeared normal.  There was question of an  upper ureteral stone, but there was a dilated ureter seen distal to this  area in question.  Urologic consultation is requested.   PAST MEDICAL HISTORY:  1.  Breast cancer.  She has had bilateral mastectomies.  2.  History of Hodgkin's disease.  3.  She  is status post multiple surgical procedures.   Her Health History Summary was reviewed.   MEDICATIONS PRIOR TO ADMISSION:  1.  Alpha methyldopa 750 mg p.o. 3 times a day.  2.  Levothyroxine, alternating doses of 50 and 75 mcg on daily basis.  3.  Prevacid 30 mg p.o. daily.  4.  Delalutin.   ALLERGIES:  ZOFRAN, COMPAZINE, PHENERGAN, REGLAN, TEGADERM TAPE, and ATIVAN.   FAMILY HISTORY:  Unknown as she is adopted.   REVIEW OF SYSTEMS:  She denies any left-sided pain.  She denies any recent  change in bowel or bladder habits. Rest of organ systems negative.   PHYSICAL EXAMINATION:  GENERAL: Adult female who appeared in mild distress.  LUNGS:  Clear to auscultation bilaterally.  HEART: Regular rate and rhythm with a grade 2/6 systolic ejection murmur.  ABDOMEN:  Consistent with her gravid condition. There was mild to moderate  right CVA tenderness.  There was  mild to moderate right lower quadrant  tenderness.  No rebound, no guarding, no hepatosplenomegaly or masses.  EXTREMITIES:  No peripheral edema.  SKIN: Warm and dry.  NEUROLOGIC:  Alert and oriented.  Mental status and affect were fairly  normal.   UA and ultrasound were reviewed as dictated in HPI.   IMPRESSION:  1.  History of renal calculi seen in prior urologic examination.  2.  Possible right ureteral stone.  3.  Right hydronephrosis, may be physiologic with the patient's pregnancy or      due to a distal ureteral stone.   PLAN:  1.  I have spoken with Dr. Dellis Filbert and the patient and her husband.  I think      it is worthwhile to institute an alpha blocker, specifically Flomax in      this case.  It does diminish ureteral stone transit time in previous      studies.  2.  I would continue the patient's Dilaudid PCA as she seems to be doing      fairly well on this.  3.  If the patient's pain continues until tomorrow, would consider CT      urogram to better evaluate for a distal ureteral stone.      Lillette Boxer.  Dahlstedt, M.D.  Electronically Signed     SMD/MEDQ  D:  07/15/2005  T:  07/15/2005  Job:  [id number]   cc:   Servando Salina, M.D.  Fax: [phone]

## 2011-02-16 NOTE — Op Note (Signed)
[name], [name]                ACCOUNT NO.:  [id number]   MEDICAL RECORD NO.:  [id number]          PATIENT TYPE:  AMB   LOCATION:  [place]                           FACILITY:  [facility]   PHYSICIAN:  [place] B. Rosana Hoes, M.D.  DATE OF BIRTH:  08-27-1971   DATE OF PROCEDURE:  08/21/2004  DATE OF DISCHARGE:                                 OPERATIVE REPORT   PREOPERATIVE DIAGNOSES:  1.  Seven weeks' missed abortion.  2.  History of recurrent miscarriage.   POSTOPERATIVE DIAGNOSES:  1.  Seven weeks' missed abortion.  2.  History of recurrent miscarriage.   PROCEDURE:  Suction curettage.   SURGEON:  Blair Dolphin. Rosana Hoes, M.D.   ANESTHESIA:  MAC and 20 mL 2% lidocaine paracervical block.   SPECIMENS:  Products of conception, a portion of which was sent to genetics.   COMPLICATIONS:  None.   INDICATIONS:  Patient with her fourth consecutive spontaneous pregnancy  loss.  Previous negative antiphospholipid antibody workup and post septum  resection HSG.  Patient with a history of lymphoma and breast cancer, both  of which were treated after her only live birth.  The patient was advised of  the risks of the surgery, including infection, bleeding, uterine  perforation, and damage to surrounding organs.   PROCEDURE:  The patient was taken to the operating room and MAC anesthesia  obtained.  She was prepped and draped in standard fashion.  Exam under  anesthesia showed a seven-week retroverted uterus, no adnexal masses.   The bladder emptied with a red rubber catheter.  Speculum inserted.  Paracervical block placed.  A single-tooth attached to the anterior lip of  the cervix.  The cervix dilated to a #21 without difficulty.  The #7 suction  cannula was inserted.  With suction on, the uterus was cleared.  Endometrium  was curetted and a gritty texture noted.  Suction passed once again and no  additional tissue returned.   I visualized the uterus on ultrasound in the operating room immediately  after the second pass with suction.  There was no evidence of any retained  tissue, as the endometrial stripe appeared normal and not thickened.   Therefore, the instruments were removed from the vagina and the cervix was  hemostatic.   The patient tolerated the procedure well without complications.  She was  taken to the recovery room awake, alert, and in stable condition.     Wesl   WBD/MEDQ  D:  08/21/2004  T:  08/21/2004  Job:  [id number]

## 2011-02-21 ENCOUNTER — Emergency Department (HOSPITAL_COMMUNITY)
Admission: EM | Admit: 2011-02-21 | Discharge: 2011-02-21 | Disposition: A | Discharge status: Home / Self Care | Payer: Commercial Managed Care - PPO | Attending: Emergency Medicine | Admitting: Emergency Medicine

## 2011-02-21 DIAGNOSIS — I10 Essential (primary) hypertension: Secondary | ICD-10-CM | POA: Insufficient documentation

## 2011-02-21 DIAGNOSIS — F411 Generalized anxiety disorder: Secondary | ICD-10-CM | POA: Insufficient documentation

## 2011-02-21 DIAGNOSIS — E039 Hypothyroidism, unspecified: Secondary | ICD-10-CM | POA: Insufficient documentation

## 2011-02-21 DIAGNOSIS — R51 Headache: Secondary | ICD-10-CM | POA: Insufficient documentation

## 2011-02-21 DIAGNOSIS — H5710 Ocular pain, unspecified eye: Secondary | ICD-10-CM | POA: Insufficient documentation

## 2011-02-21 DIAGNOSIS — Z8571 Personal history of Hodgkin lymphoma: Secondary | ICD-10-CM | POA: Insufficient documentation

## 2011-02-21 DIAGNOSIS — E2749 Other adrenocortical insufficiency: Secondary | ICD-10-CM | POA: Insufficient documentation

## 2011-02-21 DIAGNOSIS — H538 Other visual disturbances: Secondary | ICD-10-CM | POA: Insufficient documentation

## 2011-02-21 LAB — CBC
HCT: 37.8 % (ref 36.0–46.0)
Hemoglobin: 12.3 g/dL (ref 12.0–15.0)
MCHC: 32.5 g/dL (ref 30.0–36.0)
RDW: 13.6 % (ref 11.5–15.5)
WBC: 6.2 10*3/uL (ref 4.0–10.5)

## 2011-02-21 LAB — POCT I-STAT, CHEM 8
HCT: 37 % (ref 36.0–46.0)
Hemoglobin: 12.6 g/dL (ref 12.0–15.0)
Potassium: 4.2 mEq/L (ref 3.5–5.1)
Sodium: 139 mEq/L (ref 135–145)
TCO2: 23 mmol/L (ref 0–100)

## 2011-02-21 LAB — DIFFERENTIAL
Basophils Absolute: 0 10*3/uL (ref 0.0–0.1)
Basophils Relative: 1 % (ref 0–1)
Lymphocytes Relative: 34 % (ref 12–46)
Monocytes Absolute: 0.7 10*3/uL (ref 0.1–1.0)
Neutro Abs: 3.3 10*3/uL (ref 1.7–7.7)

## 2011-02-21 LAB — SEDIMENTATION RATE: Sed Rate: 5 mm/hr (ref 0–22)

## 2011-04-10 ENCOUNTER — Emergency Department (HOSPITAL_COMMUNITY)
Admission: EM | Admit: 2011-04-10 | Discharge: 2011-04-10 | Disposition: A | Discharge status: Home / Self Care | Payer: Worker's Compensation | Attending: Emergency Medicine | Admitting: Emergency Medicine

## 2011-04-10 DIAGNOSIS — W540XXA Bitten by dog, initial encounter: Secondary | ICD-10-CM | POA: Insufficient documentation

## 2011-04-10 DIAGNOSIS — S8010XA Contusion of unspecified lower leg, initial encounter: Secondary | ICD-10-CM | POA: Insufficient documentation

## 2011-04-10 DIAGNOSIS — Y990 Civilian activity done for income or pay: Secondary | ICD-10-CM | POA: Insufficient documentation

## 2011-04-10 DIAGNOSIS — I10 Essential (primary) hypertension: Secondary | ICD-10-CM | POA: Insufficient documentation

## 2011-04-10 DIAGNOSIS — Y9269 Other specified industrial and construction area as the place of occurrence of the external cause: Secondary | ICD-10-CM | POA: Insufficient documentation

## 2011-04-10 DIAGNOSIS — E039 Hypothyroidism, unspecified: Secondary | ICD-10-CM | POA: Insufficient documentation

## 2011-04-10 DIAGNOSIS — S51809A Unspecified open wound of unspecified forearm, initial encounter: Secondary | ICD-10-CM | POA: Insufficient documentation

## 2011-05-07 ENCOUNTER — Telehealth: Payer: Self-pay | Admitting: Cardiovascular Disease

## 2011-05-07 ENCOUNTER — Other Ambulatory Visit: Payer: Self-pay | Admitting: *Deleted

## 2011-05-07 DIAGNOSIS — E785 Hyperlipidemia, unspecified: Secondary | ICD-10-CM

## 2011-05-07 NOTE — Telephone Encounter (Signed)
Pt called to cancel 0813 appt she is going to be out of town. She wants to talk to you about rescheduling

## 2011-05-14 ENCOUNTER — Ambulatory Visit: Payer: Commercial Managed Care - PPO | Admitting: Cardiovascular Disease

## 2011-05-30 ENCOUNTER — Other Ambulatory Visit: Payer: Commercial Managed Care - PPO | Admitting: *Deleted

## 2011-05-30 ENCOUNTER — Ambulatory Visit (INDEPENDENT_AMBULATORY_CARE_PROVIDER_SITE_OTHER): Payer: Worker's Compensation | Admitting: Cardiovascular Disease

## 2011-05-30 ENCOUNTER — Encounter: Payer: Self-pay | Admitting: Cardiovascular Disease

## 2011-05-30 DIAGNOSIS — R079 Chest pain, unspecified: Secondary | ICD-10-CM

## 2011-05-30 DIAGNOSIS — R002 Palpitations: Secondary | ICD-10-CM

## 2011-05-30 NOTE — Assessment & Plan Note (Signed)
Her palpitations are well controlled on metoprolol 50 mg a day.

## 2011-05-30 NOTE — Progress Notes (Signed)
[name] Date of Birth  Apr 13, 1971 [facility] [address]     [place], [place]  [phone]  Fax  [phone]  History of Present Illness:  [name] is a 40 year old female with a complex medical history. She has intermittent episodes of chest pain.  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  She's had some palpitations in the past which are well controlled on Toprol XL 50 mg a day. Her pharmacy mistakenly gave her some 100 mg tablets mixed in with her 50 mg tablets. She was having intermittent days of generalized weakness and low blood pressure. She discovered the medication there and has [facility]. Now she is back on her stable 50 mg a day, she's feeling much better and her palpitations are better controlled.  Current Outpatient Prescriptions on File Prior to Visit  Medication Sig Dispense Refill  . clonazePAM (KLONOPIN) 0.5 MG tablet Take 0.5 mg by mouth 2 (two) times daily as needed.        Marland Kitchen dexlansoprazole (DEXILANT) 60 MG capsule Take 60 mg by mouth daily.        . diphenhydrAMINE (BENADRYL) 25 MG tablet Take 25 mg by mouth at bedtime.        . hydrocortisone (CORTEF) 10 MG tablet Take 10 mg by mouth daily. 10 MG IN THE AM, 5MG IN THE PM       . IBUPROFEN PO Take by mouth as needed.        Marland Kitchen levothyroxine (SYNTHROID, LEVOTHROID) 100 MCG tablet Take 100 mcg by mouth daily.        . metoprolol succinate (TOPROL-XL) 25 MG 24 hr tablet Take 25 mg by mouth daily. Take 50 mg daily      . propranolol (INDERAL) 10 MG tablet Take 10 mg by mouth as needed.        . sertraline (ZOLOFT) 25 MG tablet Take 12.5 mg by mouth daily.        Marland Kitchen zolpidem (AMBIEN CR) 12.5 MG CR tablet Take 12.5 mg by mouth at bedtime.          Allergies  Allergen Reactions  . Ativan   . Compazine   . Cymbalta (Duloxetine Hcl)   . Dilaudid (Hydromorphone Hcl)   . Morphine And Related   . Other      TAPE ALLERGY  . Phenergan   . Reglan   . Tegaderm Ag Mesh (Silver)   . Zofran     Past Medical History  Diagnosis Date  . Hodgkin lymphoma     STATUS POST MANTLE RADIATION  . Breast cancer     STATUS POST BILATERAL MASTECTOMY. STATUS POST RECONSTRUCTION. SHE HAD SILICONE BREAST IMPLANTS AND THE LEFT IMPLANT IS LEAKING SLIGHTLY  . Thyroid cancer     STATUS POST SURGICAL REMOVAL-CURRENT ON THYROID REPLACEMENT  . Adrenal insufficiency   . Tachycardia   . Palpitations   . Chest pain   . Hypertension   . Raynaud phenomenon   . Appendicitis 12/19/09  . GERD (gastroesophageal reflux disease)     Past Surgical History  Procedure Date  . Pituitary surgery   . Cardiac catheterization 05/18/09    NORMAL CATH    History  Smoking status  . Never Smoker   Smokeless tobacco  . Not on file    History  Alcohol Use  . Yes    occassionally  History reviewed. No pertinent family history.  Reviw of Systems:  Reviewed in the HPI.  All other systems are negative.  Physical Exam: BP 116/62  Pulse 82  Ht 5' 6" (1.676 m)  Wt 124 lb 9.6 oz (56.518 kg)  BMI 20.11 kg/m2 Recheck BP ws 124/80  The patient is alert and oriented x 3.  The mood and affect are normal.   Skin: warm and dry.  Color is normal.    HEENT:   the sclera are nonicteric.  The mucous membranes are moist.  The carotids are 2+ without bruits.  There is no thyromegaly.  There is no JVD.    Lungs: clear.  The chest wall is non tender.    Heart: regular rate with a normal S1 and S2.  There are no murmurs, gallops, or rubs. The PMI is not displaced.     Abdomen: good bowel sounds.  There is no guarding or rebound.  There is no hepatosplenomegaly or tenderness.  There are no masses.   Extremities:  no clubbing, cyanosis, or edema.  The legs are without rashes.  The distal pulses are intact.   Neuro:  Cranial nerves II - XII are intact.  Motor and sensory functions are intact.    The gait is  normal.  Assessment / Plan:

## 2011-05-30 NOTE — Assessment & Plan Note (Signed)
She's doing very well. We'll continue with her same medications.

## 2011-05-31 ENCOUNTER — Other Ambulatory Visit: Payer: Commercial Managed Care - PPO | Admitting: *Deleted

## 2011-10-05 ENCOUNTER — Telehealth: Payer: Self-pay | Admitting: *Deleted

## 2011-10-05 ENCOUNTER — Encounter: Payer: Self-pay | Admitting: *Deleted

## 2011-10-05 ENCOUNTER — Telehealth: Payer: Self-pay | Admitting: Cardiovascular Disease

## 2011-10-05 MED ORDER — METOPROLOL SUCCINATE ER 25 MG PO TB24: ORAL_TABLET | ORAL | Status: DC

## 2011-10-05 NOTE — Telephone Encounter (Signed)
Pt called stating feeling sluggish and BP was 90/50.  Palpitations well controlled currently. Told to reduce metoprolol from 50 mg daily to  25 mg and 50 mg alternating days to see if helps BP but controls rhythm. Pt dose have inderal 10 mg for palpitations and told her that could be used also.  She was instructed to take bp daily and call with further questions or concerns. Pt agreed to plan.

## 2011-10-05 NOTE — Telephone Encounter (Signed)
Yesterday afternoon pt b/p was 90/50 and her hands and feet are going numb and her pcp told her to be seen asap

## 2011-10-22 ENCOUNTER — Encounter: Payer: Self-pay | Admitting: Cardiovascular Disease

## 2011-10-22 ENCOUNTER — Other Ambulatory Visit: Payer: Self-pay | Admitting: Cardiovascular Disease

## 2011-10-22 NOTE — Telephone Encounter (Signed)
Spoke with Pharmacy and they've already received Med. Opened in Error

## 2011-10-25 ENCOUNTER — Telehealth: Payer: Self-pay | Admitting: Cardiovascular Disease

## 2011-10-25 NOTE — Telephone Encounter (Signed)
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

## 2011-10-25 NOTE — Telephone Encounter (Signed)
msg left i will try and reach her later

## 2011-12-05 ENCOUNTER — Ambulatory Visit: Payer: Self-pay | Admitting: Cardiovascular Disease

## 2011-12-14 ENCOUNTER — Telehealth: Payer: Self-pay | Admitting: Cardiovascular Disease

## 2011-12-14 NOTE — Telephone Encounter (Signed)
Pt states she wants to be seen today, explained that dr Acie Fredrickson is on vacation. C/o chest/back ache, exhausted, not sleeping, palpitations worse and having wt go up and down by 5 lb. Started two days ago, bp140/99 p 82. Pt tried to be seen by pcp and they said to see cardiology. DOD Dr Johnsie Cancel asked if he could see, advised going to pcp or urgent care, schedule full on PA Kathlen Mody and NP Gerhardt. Pt informed and wants Dr Acie Fredrickson to call her on Monday when he returns, told pt I would send him the msg.

## 2011-12-14 NOTE — Telephone Encounter (Signed)
New Msg: Pt calling wanting to speak with nurse/MD regarding pt not feeling well. Pt stated that she is having palpations and wanted to know if pt can be seen. Pt has appt to see Dr. Acie Fredrickson on March, 28th, 2013. Please return pt call to discuss further.

## 2011-12-15 ENCOUNTER — Emergency Department (INDEPENDENT_AMBULATORY_CARE_PROVIDER_SITE_OTHER): Payer: Commercial Managed Care - PPO

## 2011-12-15 ENCOUNTER — Emergency Department (HOSPITAL_BASED_OUTPATIENT_CLINIC_OR_DEPARTMENT_OTHER)
Admission: EM | Admit: 2011-12-15 | Discharge: 2011-12-15 | Disposition: A | Discharge status: Home / Self Care | Payer: Commercial Managed Care - PPO | Attending: Emergency Medicine | Admitting: Emergency Medicine

## 2011-12-15 ENCOUNTER — Encounter (HOSPITAL_BASED_OUTPATIENT_CLINIC_OR_DEPARTMENT_OTHER): Payer: Self-pay | Admitting: Emergency Medicine

## 2011-12-15 DIAGNOSIS — M25539 Pain in unspecified wrist: Secondary | ICD-10-CM

## 2011-12-15 DIAGNOSIS — S59919A Unspecified injury of unspecified forearm, initial encounter: Secondary | ICD-10-CM | POA: Insufficient documentation

## 2011-12-15 DIAGNOSIS — S61509A Unspecified open wound of unspecified wrist, initial encounter: Secondary | ICD-10-CM

## 2011-12-15 DIAGNOSIS — S60219A Contusion of unspecified wrist, initial encounter: Secondary | ICD-10-CM

## 2011-12-15 DIAGNOSIS — Z85850 Personal history of malignant neoplasm of thyroid: Secondary | ICD-10-CM | POA: Insufficient documentation

## 2011-12-15 DIAGNOSIS — X58XXXA Exposure to other specified factors, initial encounter: Secondary | ICD-10-CM

## 2011-12-15 DIAGNOSIS — S59909A Unspecified injury of unspecified elbow, initial encounter: Secondary | ICD-10-CM | POA: Insufficient documentation

## 2011-12-15 DIAGNOSIS — Z853 Personal history of malignant neoplasm of breast: Secondary | ICD-10-CM | POA: Insufficient documentation

## 2011-12-15 DIAGNOSIS — I7300 Raynaud's syndrome without gangrene: Secondary | ICD-10-CM | POA: Insufficient documentation

## 2011-12-15 DIAGNOSIS — K219 Gastro-esophageal reflux disease without esophagitis: Secondary | ICD-10-CM | POA: Insufficient documentation

## 2011-12-15 DIAGNOSIS — C8190 Hodgkin lymphoma, unspecified, unspecified site: Secondary | ICD-10-CM | POA: Insufficient documentation

## 2011-12-15 DIAGNOSIS — I10 Essential (primary) hypertension: Secondary | ICD-10-CM | POA: Insufficient documentation

## 2011-12-15 DIAGNOSIS — S6990XA Unspecified injury of unspecified wrist, hand and finger(s), initial encounter: Secondary | ICD-10-CM | POA: Insufficient documentation

## 2011-12-15 DIAGNOSIS — T148XXA Other injury of unspecified body region, initial encounter: Secondary | ICD-10-CM

## 2011-12-15 DIAGNOSIS — Z79899 Other long term (current) drug therapy: Secondary | ICD-10-CM | POA: Insufficient documentation

## 2011-12-15 DIAGNOSIS — W268XXA Contact with other sharp object(s), not elsewhere classified, initial encounter: Secondary | ICD-10-CM | POA: Insufficient documentation

## 2011-12-15 HISTORY — DX: Nonrheumatic aortic (valve) stenosis: I35.0

## 2011-12-15 MED ORDER — IBUPROFEN 800 MG PO TABS
800.0000 mg | ORAL_TABLET | Freq: Once | ORAL | Status: AC
  Administered 2011-12-15: 800 mg via ORAL
  Filled 2011-12-15: qty 1

## 2011-12-15 MED ORDER — IBUPROFEN 800 MG PO TABS: 800.0000 mg | ORAL_TABLET | Freq: Three times a day (TID) | ORAL | Status: AC

## 2011-12-15 NOTE — ED Notes (Signed)
[place] MD at bedside.

## 2011-12-15 NOTE — ED Provider Notes (Signed)
History    This chart was scribed for [name] A. Lauris Poag, MD, MD by Rhae Lerner. The patient was seen in room MH11 and the patient's care was started at 9:24PM.   CSN: [id number]  Arrival date & time 12/15/11  1842   First MD Initiated Contact with Patient 12/15/11 2039      Chief Complaint  Patient presents with  . Wrist Injury    (Consider location/radiation/quality/duration/timing/severity/associated sxs/prior treatment) The history is provided by the patient.   CERISSA White is a 41 y.o. female who presents to the Emergency Department complaining of moderate left wrist pain and injury. Pt reports buying grill and putting it in car. While putting it in the car the grill fell back and hit her wrist and causing puncture. The pt denies any other injuries. Pt reports that tetanus UTD. She reports that she has numbness. There is no radiation.   Past Medical History  Diagnosis Date  . Hodgkin lymphoma     STATUS POST MANTLE RADIATION  . Breast cancer     STATUS POST BILATERAL MASTECTOMY. STATUS POST RECONSTRUCTION. SHE HAD SILICONE BREAST IMPLANTS AND THE LEFT IMPLANT IS LEAKING SLIGHTLY  . Thyroid cancer     STATUS POST SURGICAL REMOVAL-CURRENT ON THYROID REPLACEMENT  . Adrenal insufficiency   . Tachycardia   . Palpitations   . Chest pain   . Hypertension   . Raynaud phenomenon   . Appendicitis 12/19/09  . GERD (gastroesophageal reflux disease)   . Aortic stenosis     Past Surgical History  Procedure Date  . Pituitary surgery   . Cardiac catheterization 05/18/09    NORMAL CATH  . Appendectomy   . Abdominal hysterectomy     History reviewed. No pertinent family history.  History  Substance Use Topics  . Smoking status: Never Smoker   . Smokeless tobacco: Not on file  . Alcohol Use: Yes     occassionally    OB History    Grav Para Term Preterm Abortions TAB SAB Ect Mult Living                  Review of Systems  All other systems reviewed and are  negative.   10 Systems reviewed and are negative for acute change except as noted in the HPI.  Allergies  Ativan; Compazine; Cymbalta; Dilaudid; Morphine and related; Other; Phenergan; Reglan; Tegaderm ag mesh; and Zofran  Home Medications   Current Outpatient Rx  Name Route Sig Dispense Refill  . AMOXICILLIN-POT CLAVULANATE 875-125 MG PO TABS Oral Take 1 tablet by mouth 2 (two) times daily.    . DEXLANSOPRAZOLE 60 MG PO CPDR Oral Take 60 mg by mouth daily.      Marland Kitchen DIPHENHYDRAMINE HCL 25 MG PO TABS Oral Take 25 mg by mouth at bedtime.      . IBUPROFEN PO Oral Take by mouth as needed.      Marland Kitchen LEVOTHYROXINE SODIUM 100 MCG PO TABS Oral Take 100 mcg by mouth daily.      Marland Kitchen METOPROLOL SUCCINATE ER 25 MG PO TB24  Alternate taking 25 mg and 50 mg every other day. 45 tablet 5  . ZOLPIDEM TARTRATE ER 12.5 MG PO TBCR Oral Take 12.5 mg by mouth at bedtime.      Marland Kitchen CLONAZEPAM 0.5 MG PO TABS Oral Take 0.5 mg by mouth 2 (two) times daily as needed.      Marland Kitchen DOXYCYCLINE HYCLATE 100 MG PO CAPS Oral Take 100 mg by mouth 2 (  two) times daily.      Marland Kitchen HYDROCORTISONE 10 MG PO TABS Oral Take 10 mg by mouth daily. 10 MG IN THE AM, 5MG IN THE PM     . PROPRANOLOL HCL 10 MG PO TABS Oral Take 10 mg by mouth as needed.      . SERTRALINE HCL 25 MG PO TABS Oral Take 12.5 mg by mouth daily.        BP 113/77  Pulse 84  Temp(Src) 97.7 F (36.5 C) (Oral)  Resp 18  Ht 5' 5" (1.651 m)  Wt 127 lb (57.607 kg)  BMI 21.13 kg/m2  SpO2 100%  Physical Exam  Nursing note and vitals reviewed. Constitutional: She is oriented to person, place, and time. She appears well-developed and well-nourished. No distress.  HENT:  Head: Normocephalic and atraumatic.  Eyes: Conjunctivae are normal. Pupils are equal, round, and reactive to light.  Neck: Normal range of motion. Neck supple.  Cardiovascular: Normal rate, regular rhythm and normal heart sounds.   Pulmonary/Chest: Effort normal. No respiratory distress.  Abdominal: Soft.  She exhibits no distension.  Musculoskeletal:       Right hand dominant  1.5 cm superficial laceration with no active bleeding No obvious foreign body Cap refill is normal Pulse normal ROM slightly limited to pain  Neurological: She is alert and oriented to person, place, and time.  Skin: Skin is warm and dry.  Psychiatric: She has a normal mood and affect. Her behavior is normal.    ED Course  Procedures (including critical care time) DIAGNOSTIC STUDIES: Oxygen Saturation is 100% on room air, normal by my interpretation.    COORDINATION OF CARE: 9:30PM EDP discusses pt ED treatment course with pt   Labs Reviewed - No data to display Dg Wrist Complete Left  12/15/2011  *RADIOLOGY REPORT*  Clinical Data: Blunt trauma to the wrist.  Laceration and pain over the radial aspect of the wrist.  LEFT WRIST - COMPLETE 3+ VIEW  Comparison: None available.  Findings: No acute bone or soft tissue abnormality is present.  The wrist is located.  IMPRESSION: Negative left wrist.  Original Report Authenticated By: Resa Miner. MATTERN, M.D.     No diagnosis found.    MDM  Pt is seen and examined;  Initial history and physical completed.  Will follow.   I personally performed the services described in this documentation, which was scribed in my presence.  The recorded information has been reviewed and considered. Jacquette Canales A. Lauris Poag, MD.  _0 @    Wound care, Velcro splints. Tetanus is up-to-date. Ibuprofen and will give pain. Referral. Stable for discharge       [name] A. Lauris Poag, MD 12/15/11 2152

## 2011-12-15 NOTE — Discharge Instructions (Signed)
Contusion A contusion is a deep bruise. Contusions are the result of an injury that caused bleeding under the skin. The contusion may turn blue, purple, or yellow. Minor injuries will give you a painless contusion, but more severe contusions may stay painful and swollen for a few weeks.  CAUSES  A contusion is usually caused by a blow, trauma, or direct force to an area of the body. SYMPTOMS   Swelling and redness of the injured area.   Bruising of the injured area.   Tenderness and soreness of the injured area.   Pain.  DIAGNOSIS  The diagnosis can be made by taking a history and physical exam. An X-ray, CT scan, or MRI may be needed to determine if there were any associated injuries, such as fractures. TREATMENT  Specific treatment will depend on what area of the body was injured. In general, the best treatment for a contusion is resting, icing, elevating, and applying cold compresses to the injured area. Over-the-counter medicines may also be recommended for pain control. Ask your caregiver what the best treatment is for your contusion. HOME CARE INSTRUCTIONS   Put ice on the injured area.   Put ice in a plastic bag.   Place a towel between your skin and the bag.   Leave the ice on for 15 to 20 minutes, 3 to 4 times a day.   Only take over-the-counter or prescription medicines for pain, discomfort, or fever as directed by your caregiver. Your caregiver may recommend avoiding anti-inflammatory medicines (aspirin, ibuprofen, and naproxen) for 48 hours because these medicines may increase bruising.   Rest the injured area.   If possible, elevate the injured area to reduce swelling.  SEEK IMMEDIATE MEDICAL CARE IF:   You have increased bruising or swelling.   You have pain that is getting worse.   Your swelling or pain is not relieved with medicines.  MAKE SURE YOU:   Understand these instructions.   Will watch your condition.   Will get help right away if you are not  doing well or get worse.  Document Released: 06/27/2005 Document Revised: 09/06/2011 Document Reviewed: 07/23/2011 [facility] Patient Information 2012 [place], [place].    Puncture Wound A puncture wound is an injury that extends through all layers of the skin and into the tissue beneath the skin (subcutaneous tissue). Puncture wounds become infected easily because germs often enter the body and go beneath the skin during the injury. Having a deep wound with a small entrance point makes it difficult for your caregiver to adequately clean the wound. This is especially true if you have stepped on a nail and it has passed through a dirty shoe or other situations where the wound is obviously contaminated. CAUSES  Many puncture wounds involve glass, nails, splinters, fish hooks, or other objects that enter the skin (foreign bodies). A puncture wound may also be caused by a human bite or animal bite. DIAGNOSIS  A puncture wound is usually diagnosed by your history and a physical exam. You may need to have an X-ray or an ultrasound to check for any foreign bodies still in the wound. TREATMENT   Your caregiver will clean the wound as thoroughly as possible. Depending on the location of the wound, a bandage (dressing) may be applied.   Your caregiver might prescribe antibiotic medicines.   You may need a follow-up visit to check on your wound. Follow all instructions as directed by your caregiver.  HOME CARE INSTRUCTIONS   Change your dressing  once per day, or as directed by your caregiver. If the dressing sticks, it may be removed by soaking the area in water.   If your caregiver has given you follow-up instructions, it is very important that you return for a follow-up appointment. Not following up as directed could result in a chronic or permanent injury, pain, and disability.   Only take over-the-counter or prescription medicines for pain, discomfort, or fever as directed by your caregiver.   If  you are given antibiotics, take them as directed. Finish them even if you start to feel better.  You may need a tetanus shot if:  You cannot remember when you had your last tetanus shot.   You have never had a tetanus shot.  If you got a tetanus shot, your arm may swell, get red, and feel warm to the touch. This is common and not a problem. If you need a tetanus shot and you choose not to have one, there is a rare chance of getting tetanus. Sickness from tetanus can be serious. You may need a rabies shot if an animal bite caused your puncture wound. SEEK MEDICAL CARE IF:   You have redness, swelling, or increasing pain in the wound.   You have red streaks going away from the wound.   You notice a bad smell coming from the wound or dressing.   You have yellowish-white fluid (pus) coming from the wound.   You are treated with an antibiotic for infection, but the infection is not getting better.   You notice something in the wound, such as rubber from your shoe, cloth, or another object.   You have a fever.   You have severe pain.   You have difficulty breathing.   You feel dizzy or faint.   You cannot stop vomiting.   You lose feeling, develop numbness, or cannot move a limb below the wound.   Your symptoms worsen.  MAKE SURE YOU:  Understand these instructions.   Will watch your condition.   Will get help right away if you are not doing well or get worse.  Document Released: 06/27/2005 Document Revised: 09/06/2011 Document Reviewed: 03/06/2011 [facility] Patient Information 2012 [place].

## 2011-12-15 NOTE — ED Notes (Signed)
I applied wound care to puncture wound, then applied a velcro wrist splint per Dr. Lauris Poag.

## 2011-12-15 NOTE — ED Notes (Signed)
Patients wrist splinted in triage. She expresses that it is uncomfortable and encouraged to take it off if that is what feels more comfortable. Splint remains in place at this time.

## 2011-12-15 NOTE — ED Notes (Signed)
Left wrist injury.  Pt had grill hit wrist.  Pt has puncture wound also.  Bleeding controlled.

## 2011-12-15 NOTE — ED Notes (Signed)
Rx for ibuprofen 800 mg given with discharge instructions

## 2011-12-27 ENCOUNTER — Encounter: Payer: Self-pay | Admitting: Cardiovascular Disease

## 2011-12-27 ENCOUNTER — Ambulatory Visit (INDEPENDENT_AMBULATORY_CARE_PROVIDER_SITE_OTHER): Payer: Commercial Managed Care - PPO | Admitting: Cardiovascular Disease

## 2011-12-27 VITALS — BP 114/75 | HR 74 | Ht 65.0 in | Wt 135.4 lb

## 2011-12-27 DIAGNOSIS — R002 Palpitations: Secondary | ICD-10-CM

## 2011-12-27 NOTE — Patient Instructions (Signed)
Your physician wants you to follow-up in: 1 YEAR OR SOONER IF NEEDED, You will receive a reminder letter in the mail two months in advance. If you don't receive a letter, please call our office to schedule the follow-up appointment.

## 2011-12-27 NOTE — Assessment & Plan Note (Signed)
Her palpitations have been fairly well controlled on her current dose of metoprolol. We'll continue the same dose. She also has PRN propranolol to take if needed.

## 2011-12-27 NOTE — Progress Notes (Addendum)
[name] Date of Birth  17-Apr-1971 [facility] / [facility] [address]     [place], [place]  [address] [phone]  Fax  [phone]  Problem list: 1. Palpitations/premature ventricular contractions 2.  history of Hodgkin's lymphoma-status post mantle radiation 3. History of breast cancer-status post Reconstruction 4. History of cervical cancer 5. History of pituitary tumor-status post surgical resection-2002 6. History of thyroidectomy 7. Raynaud's phenomenon 8. Appendectomy 9. History chest pains-normal heart catheterization, normal TEE 10. She has a history of adrenal insufficiency but her adrenal glands have started we function. She is no longer on steroids.  History of Present Illness:  [name] is done very well since I last saw her about a year ago. She has had some occasional palpitations that are typically controlled with metoprolol. She's not had any episodes of chest pain. She's been able to stop all of her steroids. Her adrenal glands have gradually improved and her now producing cortisol.  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  She is still working at [employer].    Current Outpatient Prescriptions on File Prior to Visit  Medication Sig Dispense Refill  . clonazePAM (KLONOPIN) 0.5 MG tablet Take 0.5 mg by mouth 2 (two) times daily as needed.        Marland Kitchen dexlansoprazole (DEXILANT) 60 MG capsule Take 60 mg by mouth daily.        . diphenhydrAMINE (BENADRYL) 25 MG tablet Take 25 mg by mouth at bedtime.        . IBUPROFEN PO Take by mouth as needed.        Marland Kitchen levothyroxine (SYNTHROID, LEVOTHROID) 100 MCG tablet Take 100 mcg by mouth daily.        . metoprolol succinate (TOPROL-XL) 25 MG 24 hr tablet Alternate taking 25 mg and 50 mg every other day.  45 tablet  5  . propranolol (INDERAL) 10 MG tablet Take 10 mg by mouth as needed.        . zolpidem  (AMBIEN CR) 12.5 MG CR tablet Take 12.5 mg by mouth at bedtime.          Allergies  Allergen Reactions  . Ativan   . Compazine   . Cymbalta (Duloxetine Hcl)   . Dilaudid (Hydromorphone Hcl)   . Morphine And Related   . Other     TAPE ALLERGY  . Phenergan   . Reglan   . Tegaderm Ag Mesh (Silver)   . Zofran     Past Medical History  Diagnosis Date  . Hodgkin lymphoma     STATUS POST MANTLE RADIATION  . Breast cancer     STATUS POST BILATERAL MASTECTOMY. STATUS POST RECONSTRUCTION. SHE HAD SILICONE BREAST IMPLANTS AND THE LEFT IMPLANT IS LEAKING SLIGHTLY  . Thyroid cancer     STATUS POST SURGICAL REMOVAL-CURRENT ON THYROID REPLACEMENT  . Adrenal insufficiency   . Tachycardia   . Palpitations   . Chest pain   . Hypertension   . Raynaud phenomenon   . Appendicitis 12/19/09  . GERD (gastroesophageal reflux disease)   . Aortic stenosis     Past Surgical History  Procedure Date  . Pituitary surgery   . Cardiac catheterization 05/18/09    NORMAL CATH  . Appendectomy   . Abdominal hysterectomy     History  Smoking status  . Never Smoker   Smokeless tobacco  . Not on file  History  Alcohol Use  . Yes    occassionally    No family history on file.  Reviw of Systems:  Reviewed in the HPI.  All other systems are negative.  Physical Exam: BP 114/75  Pulse 74  Ht _0  (1.651 m)  Wt 135 lb 6.4 oz (61.417 kg)  BMI 22.53 kg/m2 Recheck BP ws 124/80  The patient is alert and oriented x 3.  The mood and affect are normal.   Skin: warm and dry.  Color is normal.    HEENT:   the sclera are nonicteric.  The mucous membranes are moist.  The carotids are 2+ without bruits.  There is no thyromegaly.  There is no JVD.    Lungs: clear.  The chest wall is non tender.    Heart: She has had bilateral breast reconstruction.  regular rate with a normal S1 and S2.  She has a soft systolic murmur. The PMI is not displaced.     Abdomen: good bowel sounds.  There is no  guarding or rebound.  There is no hepatosplenomegaly or tenderness.  There are no masses.   Extremities:  no clubbing, cyanosis, or edema.  The legs are without rashes.  The distal pulses are intact.   Neuro:  Cranial nerves II - XII are intact.  Motor and sensory functions are intact.    The gait is normal.  EKG: 12/27/2011: Normal sinus rhythm. Normal EKG.  Assessment / Plan:

## 2011-12-31 NOTE — Progress Notes (Signed)
Addended by: Janne Napoleon on: 12/31/2011 01:54 PM   Modules accepted: Orders

## 2012-01-20 ENCOUNTER — Emergency Department (HOSPITAL_COMMUNITY): Payer: Commercial Managed Care - PPO

## 2012-01-20 ENCOUNTER — Encounter (HOSPITAL_COMMUNITY): Payer: Self-pay | Admitting: *Deleted

## 2012-01-20 ENCOUNTER — Emergency Department (HOSPITAL_COMMUNITY)
Admission: EM | Admit: 2012-01-20 | Discharge: 2012-01-21 | Disposition: A | Discharge status: Home / Self Care | Payer: Commercial Managed Care - PPO | Attending: Emergency Medicine | Admitting: Emergency Medicine

## 2012-01-20 DIAGNOSIS — R1115 Cyclical vomiting syndrome unrelated to migraine: Secondary | ICD-10-CM

## 2012-01-20 DIAGNOSIS — R079 Chest pain, unspecified: Secondary | ICD-10-CM | POA: Insufficient documentation

## 2012-01-20 DIAGNOSIS — I10 Essential (primary) hypertension: Secondary | ICD-10-CM | POA: Insufficient documentation

## 2012-01-20 DIAGNOSIS — K219 Gastro-esophageal reflux disease without esophagitis: Secondary | ICD-10-CM

## 2012-01-20 DIAGNOSIS — R1013 Epigastric pain: Secondary | ICD-10-CM | POA: Insufficient documentation

## 2012-01-20 DIAGNOSIS — Z87898 Personal history of other specified conditions: Secondary | ICD-10-CM | POA: Insufficient documentation

## 2012-01-20 DIAGNOSIS — Z79899 Other long term (current) drug therapy: Secondary | ICD-10-CM | POA: Insufficient documentation

## 2012-01-20 DIAGNOSIS — Z85850 Personal history of malignant neoplasm of thyroid: Secondary | ICD-10-CM | POA: Insufficient documentation

## 2012-01-20 DIAGNOSIS — Z853 Personal history of malignant neoplasm of breast: Secondary | ICD-10-CM | POA: Insufficient documentation

## 2012-01-20 HISTORY — DX: Supraventricular tachycardia: I47.1

## 2012-01-20 HISTORY — DX: Supraventricular tachycardia, unspecified: I47.10

## 2012-01-20 LAB — URINALYSIS, ROUTINE W REFLEX MICROSCOPIC
Ketones, ur: >80 mg/dL — AB
Leukocytes, UA: NEGATIVE
Nitrite: NEGATIVE
Protein, ur: NEGATIVE mg/dL

## 2012-01-20 LAB — DIFFERENTIAL
Basophils Absolute: 0 10*3/uL (ref 0.0–0.1)
Basophils Relative: 1 % (ref 0–1)
Eosinophils Absolute: 0 10*3/uL (ref 0.0–0.7)
Monocytes Absolute: 0.6 10*3/uL (ref 0.1–1.0)
Neutro Abs: 6.1 10*3/uL (ref 1.7–7.7)
Neutrophils Relative %: 73 % (ref 43–77)

## 2012-01-20 LAB — COMPREHENSIVE METABOLIC PANEL
AST: 32 U/L (ref 0–37)
Albumin: 4.1 g/dL (ref 3.5–5.2)
Chloride: 101 mEq/L (ref 96–112)
Creatinine, Ser: 0.66 mg/dL (ref 0.50–1.10)
Potassium: 4 mEq/L (ref 3.5–5.1)
Total Bilirubin: 0.6 mg/dL (ref 0.3–1.2)
Total Protein: 7.2 g/dL (ref 6.0–8.3)

## 2012-01-20 LAB — TROPONIN I: Troponin I: <0.3 ng/mL (ref <0.30)

## 2012-01-20 LAB — CBC
MCHC: 34.3 g/dL (ref 30.0–36.0)
RDW: 12.6 % (ref 11.5–15.5)

## 2012-01-20 MED ORDER — PANTOPRAZOLE SODIUM 40 MG PO TBEC
40.0000 mg | DELAYED_RELEASE_TABLET | Freq: Once | ORAL | Status: AC
  Administered 2012-01-20: 40 mg via ORAL
  Filled 2012-01-20: qty 1

## 2012-01-20 MED ORDER — SODIUM CHLORIDE 0.9 % IV BOLUS (SEPSIS)
1000.0000 mL | Freq: Once | INTRAVENOUS | Status: AC
  Administered 2012-01-20: 1000 mL via INTRAVENOUS

## 2012-01-20 MED ORDER — GI COCKTAIL ~~LOC~~
30.0000 mL | Freq: Once | ORAL | Status: AC
  Administered 2012-01-20: 30 mL via ORAL
  Filled 2012-01-20: qty 30

## 2012-01-20 MED ORDER — SODIUM CHLORIDE 0.9 % IV BOLUS (SEPSIS): 1000.0000 mL | Freq: Once | INTRAVENOUS | Status: DC

## 2012-01-20 MED ORDER — DIPHENHYDRAMINE HCL 50 MG/ML IJ SOLN
25.0000 mg | Freq: Once | INTRAMUSCULAR | Status: AC
  Administered 2012-01-20: 25 mg via INTRAVENOUS
  Filled 2012-01-20: qty 1

## 2012-01-20 NOTE — ED Notes (Signed)
Pt c/o vomiting since this morning, estimates vomiting 15 times today.  Some abdominal cramping from vomiting.  Also states she feels weak.  Pt was having chest pressure, denies pain.  States pain feels like reflux and has been unable to take her meds for GERD today.  Denies diarrhea, urinary pain/burning.  Is a cancer pt on chemotherapy, states only medication that works for nausea is benedryl.

## 2012-01-20 NOTE — ED Notes (Signed)
Per EMS: pt c/o vomiting all day, c/o chest pressure in chest all day.  Stopped at fire station due to anxiety attack.

## 2012-01-20 NOTE — ED Provider Notes (Signed)
History     CSN: [id number]  Arrival date & time 01/20/12  1948   First MD Initiated Contact with Patient 01/20/12 2032      Chief Complaint  Patient presents with  . Chest Pain    (Consider location/radiation/quality/duration/timing/severity/associated sxs/prior treatment) HPI  41 year old female with a history significant for chest pain, aortic stenosis, and breast cancer, presents with chief complaints of vomiting.  Pt states since this morning she has been having persistent nonbloody, nonbilious vomiting. Describe having more than 10 bouts of vomits.  State patient only has some mild epigastric abdominal pain from vomiting however with a history of GERD she is having increased burning sensation in the epigastrium. Throughout midday patient experiencing sensation of chest tightness. Symptoms continued to persist. Patient decided to come to ER for further evaluation. Patient states she has been evaluated for heart disease, and was recently cleared a few weeks ago. She denies any precipitating factors. Does admits to have some social drink the night before but nothing more than her usual. She denies any recent sick contacts. She denies any recent surgery, prolonged bed rest, calf pain or leg swelling, or prolonged trip. She has been cancer free for 9 years.  Past Medical History  Diagnosis Date  . Hodgkin lymphoma     STATUS POST MANTLE RADIATION  . Breast cancer     STATUS POST BILATERAL MASTECTOMY. STATUS POST RECONSTRUCTION. SHE HAD SILICONE BREAST IMPLANTS AND THE LEFT IMPLANT IS LEAKING SLIGHTLY  . Thyroid cancer     STATUS POST SURGICAL REMOVAL-CURRENT ON THYROID REPLACEMENT  . Adrenal insufficiency   . Tachycardia   . Palpitations   . Chest pain   . Hypertension   . Raynaud phenomenon   . Appendicitis 12/19/09  . GERD (gastroesophageal reflux disease)   . Aortic stenosis   . Aortic stenosis   . SVT (supraventricular tachycardia)     Past Surgical History  Procedure  Date  . Pituitary surgery   . Cardiac catheterization 05/18/09    NORMAL CATH  . Appendectomy   . Abdominal hysterectomy     History reviewed. No pertinent family history.  History  Substance Use Topics  . Smoking status: Never Smoker   . Smokeless tobacco: Not on file  . Alcohol Use: Yes     occassionally    OB History    Grav Para Term Preterm Abortions TAB SAB Ect Mult Living                  Review of Systems  All other systems reviewed and are negative.    Allergies  Ativan; Compazine; Cymbalta; Dilaudid; Morphine and related; Other; Phenergan; Reglan; Tegaderm ag mesh; and Zofran  Home Medications   Current Outpatient Rx  Name Route Sig Dispense Refill  . CLONAZEPAM 0.5 MG PO TABS Oral Take 0.5 mg by mouth 2 (two) times daily as needed. For anxiety.    . DEXLANSOPRAZOLE 60 MG PO CPDR Oral Take 60 mg by mouth daily.      Marland Kitchen LEVOTHYROXINE SODIUM 100 MCG PO TABS Oral Take 100 mcg by mouth daily.      Marland Kitchen METOPROLOL SUCCINATE ER 25 MG PO TB24 Oral Take 25-50 mg by mouth daily. Alternate taking 25 mg and 50 mg every other day.    Marland Kitchen PROPRANOLOL HCL 10 MG PO TABS Oral Take 10 mg by mouth as needed. For heart palpitations.    Marland Kitchen ZOLPIDEM TARTRATE ER 12.5 MG PO TBCR Oral Take 12.5 mg by mouth at  bedtime.        BP 121/94  Pulse 102  Temp(Src) 98.3 F (36.8 C) (Oral)  Resp 16  SpO2 100%  Physical Exam  Nursing note and vitals reviewed. Constitutional: She appears well-developed and well-nourished. No distress.       Awake, alert, nontoxic appearance  HENT:  Head: Atraumatic.  Mouth/Throat: Oropharynx is clear and moist. No oropharyngeal exudate.  Eyes: Conjunctivae are normal. Right eye exhibits no discharge. Left eye exhibits no discharge.  Neck: Neck supple.  Cardiovascular: Normal rate and regular rhythm.   Pulmonary/Chest: Effort normal. No respiratory distress. She exhibits no tenderness.  Abdominal: Soft. There is no tenderness. There is no rebound.    Musculoskeletal: She exhibits no edema and no tenderness.       ROM appears intact, no obvious focal weakness  Lymphadenopathy:    She has no cervical adenopathy.  Neurological: She is alert.       Mental status and motor strength appears intact  Skin: No rash noted.  Psychiatric: She has a normal mood and affect.    ED Course  Procedures (including critical care time)   Labs Reviewed  URINALYSIS, ROUTINE W REFLEX MICROSCOPIC   No results found.   No diagnosis found.  Results for orders placed during the hospital encounter of 01/20/12  URINALYSIS, ROUTINE W REFLEX MICROSCOPIC      Component Value Range   Color, Urine YELLOW  YELLOW    APPearance CLOUDY (*) CLEAR    Specific Gravity, Urine 1.021  1.005 - 1.030    pH 5.5  5.0 - 8.0    Glucose, UA NEGATIVE  NEGATIVE (mg/dL)   Hgb urine dipstick NEGATIVE  NEGATIVE    Bilirubin Urine NEGATIVE  NEGATIVE    Ketones, ur >80 (*) NEGATIVE (mg/dL)   Protein, ur NEGATIVE  NEGATIVE (mg/dL)   Urobilinogen, UA 0.2  0.0 - 1.0 (mg/dL)   Nitrite NEGATIVE  NEGATIVE    Leukocytes, UA NEGATIVE  NEGATIVE   CBC      Component Value Range   WBC 8.4  4.0 - 10.5 (K/uL)   RBC 4.57  3.87 - 5.11 (MIL/uL)   Hemoglobin 13.4  12.0 - 15.0 (g/dL)   HCT 39.1  36.0 - 46.0 (%)   MCV 85.6  78.0 - 100.0 (fL)   MCH 29.3  26.0 - 34.0 (pg)   MCHC 34.3  30.0 - 36.0 (g/dL)   RDW 12.6  11.5 - 15.5 (%)   Platelets 234  150 - 400 (K/uL)  DIFFERENTIAL      Component Value Range   Neutrophils Relative 73  43 - 77 (%)   Neutro Abs 6.1  1.7 - 7.7 (K/uL)   Lymphocytes Relative 19  12 - 46 (%)   Lymphs Abs 1.6  0.7 - 4.0 (K/uL)   Monocytes Relative 7  3 - 12 (%)   Monocytes Absolute 0.6  0.1 - 1.0 (K/uL)   Eosinophils Relative 0  0 - 5 (%)   Eosinophils Absolute 0.0  0.0 - 0.7 (K/uL)   Basophils Relative 1  0 - 1 (%)   Basophils Absolute 0.0  0.0 - 0.1 (K/uL)  COMPREHENSIVE METABOLIC PANEL      Component Value Range   Sodium 137  135 - 145 (mEq/L)    Potassium 4.0  3.5 - 5.1 (mEq/L)   Chloride 101  96 - 112 (mEq/L)   CO2 20  19 - 32 (mEq/L)   Glucose, Bld 80  70 - 99 (mg/dL)  BUN 13  6 - 23 (mg/dL)   Creatinine, Ser 0.66  0.50 - 1.10 (mg/dL)   Calcium 9.1  8.4 - 10.5 (mg/dL)   Total Protein 7.2  6.0 - 8.3 (g/dL)   Albumin 4.1  3.5 - 5.2 (g/dL)   AST 32  0 - 37 (U/L)   ALT 19  0 - 35 (U/L)   Alkaline Phosphatase 44  39 - 117 (U/L)   Total Bilirubin 0.6  0.3 - 1.2 (mg/dL)   GFR calc non Af Amer >90  >90 (mL/min)   GFR calc Af Amer >90  >90 (mL/min)  LIPASE, BLOOD      Component Value Range   Lipase 21  11 - 59 (U/L)  TROPONIN I      Component Value Range   Troponin I <0.30  <0.30 (ng/mL)  D-DIMER, QUANTITATIVE      Component Value Range   D-Dimer, Quant 0.26  0.00 - 0.48 (ug/mL-FEU)   Dg Chest 2 View  01/20/2012  *RADIOLOGY REPORT*  Clinical Data: Vomiting  CHEST - 2 VIEW  Comparison: 04/11/2010  Findings: Bilateral axillary clips and ill-defined density over the lower lung field compatible with previous implant reconstruction. No new pulmonary opacity.  Heart size is normal.  No pleural effusion.  No acute osseous finding.  IMPRESSION: No acute cardiopulmonary process.  Original Report Authenticated By: Arline Asp, M.D.      MDM  Persisting vomiting, with sensation of chest tightness. She is mildly tachycardic. However satting at 100% on room air. Workup initiated. Patient is allergic to many medication, will give Benadryl and GI cocktail to treat her symptoms.   10:32 PM Pt sts GI cocktail and IVF has helped her sxs.  Does complain of epigastric burning sensation and accoun that to her GERD.  She usually takes her Dexilant but has not been able to keep it down today.  Will give protonix PO here in ED.  Her lab results shows no concerning factors.  abd nonsurgical, normal hepatic function panel, neg lipase, normal UA, d-dimer negative, and normal troponin.  Pt feels better.  Once tolerates PO, will discharge.  Sxs  likely viral in origin.  Pt agrees to f/u with her PCP for further evaluation.   11:45 PM Patient felt better after treatment. Sts IV fluid has greatly improved the symptoms. She is able to tolerate by mouth. She is stable to be discharged. I recommend for her to follow up with her primary provider for further evaluation. Patient voiced understanding and agrees with plan. CP resolved.  Low suspicion for obstruction.    Domenic Moras, PA-C 01/20/12 [place], PA-C 01/20/12 2349

## 2012-01-20 NOTE — ED Provider Notes (Signed)
8:08 PM  Date: 01/20/2012  Rate: 105  Rhythm: sinus tachycardia  QRS Axis: normal  Intervals: normal PQRS:  Right atrial abnormality  ST/T Wave abnormalities: normal  Conduction Disutrbances:none  Narrative Interpretation: Abnormal EKG.  Old EKG Reviewed: unchanged    Mylinda Latina III, MD 01/20/12 2009

## 2012-01-20 NOTE — ED Notes (Signed)
I gave the patient a warm blanket. 

## 2012-01-20 NOTE — Discharge Instructions (Signed)
Please follow up with your doctor for further evaluation.  Return if your symptoms persist or if you cant keep your food down.    Diet for GERD or PUD Nutrition therapy can help ease the discomfort of gastroesophageal reflux disease (GERD) and peptic ulcer disease (PUD).  HOME CARE INSTRUCTIONS   Eat your meals slowly, in a relaxed setting.   Eat 5 to 6 small meals per day.   If a food causes distress, stop eating it for a period of time.  FOODS TO AVOID  Coffee, regular or decaffeinated.   Cola beverages, regular or low calorie.   Tea, regular or decaffeinated.   Pepper.   Cocoa.   High fat foods, including meats.   Butter, margarine, hydrogenated oil (trans fats).   Peppermint or spearmint (if you have GERD).   Fruits and vegetables if not tolerated.   Alcohol.   Nicotine (smoking or chewing). This is one of the most potent stimulants to acid production in the gastrointestinal tract.   Any food that seems to aggravate your condition.  If you have questions regarding your diet, ask your caregiver or a registered dietitian. TIPS  Lying flat may make symptoms worse. Keep the head of your bed raised 6 to 9 inches (15 to 23 cm) by using a foam wedge or blocks under the legs of the bed.   Do not lay down until 3 hours after eating a meal.   Daily physical activity may help reduce symptoms.  MAKE SURE YOU:   Understand these instructions.   Will watch your condition.   Will get help right away if you are not doing well or get worse.  Document Released: 09/17/2005 Document Revised: 09/06/2011 Document Reviewed: 08/03/2011 [facility] Patient Information 2012 [place].

## 2012-01-21 NOTE — ED Provider Notes (Signed)
Medical screening examination/treatment/procedure(s) were performed by non-physician practitioner and as supervising physician I was immediately available for consultation/collaboration.   Mylinda Latina III, MD 01/21/12 2029

## 2012-01-21 NOTE — ED Notes (Signed)
No rx given, pt voiced understanding to f/u with PCP for continuing sx.

## 2012-04-12 ENCOUNTER — Other Ambulatory Visit: Payer: Self-pay | Admitting: Cardiovascular Disease

## 2012-04-14 NOTE — Telephone Encounter (Signed)
Fax Received. Refill Completed. [name] (R.M.A)   

## 2012-04-21 ENCOUNTER — Other Ambulatory Visit: Payer: Self-pay | Admitting: *Deleted

## 2012-04-21 MED ORDER — METOPROLOL SUCCINATE ER 25 MG PO TB24: ORAL_TABLET | ORAL | Status: DC

## 2012-04-21 NOTE — Telephone Encounter (Signed)
Refilled metoprolol

## 2012-06-25 ENCOUNTER — Emergency Department (HOSPITAL_COMMUNITY): Payer: Commercial Managed Care - PPO

## 2012-06-25 ENCOUNTER — Encounter (HOSPITAL_COMMUNITY): Payer: Self-pay | Admitting: Adult Health

## 2012-06-25 ENCOUNTER — Emergency Department (HOSPITAL_COMMUNITY)
Admission: EM | Admit: 2012-06-25 | Discharge: 2012-06-25 | Disposition: A | Discharge status: Home / Self Care | Payer: Commercial Managed Care - PPO | Attending: Emergency Medicine | Admitting: Emergency Medicine

## 2012-06-25 DIAGNOSIS — I10 Essential (primary) hypertension: Secondary | ICD-10-CM | POA: Insufficient documentation

## 2012-06-25 DIAGNOSIS — Z808 Family history of malignant neoplasm of other organs or systems: Secondary | ICD-10-CM | POA: Insufficient documentation

## 2012-06-25 DIAGNOSIS — R51 Headache: Secondary | ICD-10-CM | POA: Insufficient documentation

## 2012-06-25 DIAGNOSIS — Z79899 Other long term (current) drug therapy: Secondary | ICD-10-CM | POA: Insufficient documentation

## 2012-06-25 DIAGNOSIS — Z853 Personal history of malignant neoplasm of breast: Secondary | ICD-10-CM | POA: Insufficient documentation

## 2012-06-25 DIAGNOSIS — R112 Nausea with vomiting, unspecified: Secondary | ICD-10-CM

## 2012-06-25 DIAGNOSIS — Z9010 Acquired absence of unspecified breast and nipple: Secondary | ICD-10-CM | POA: Insufficient documentation

## 2012-06-25 DIAGNOSIS — R42 Dizziness and giddiness: Secondary | ICD-10-CM | POA: Insufficient documentation

## 2012-06-25 DIAGNOSIS — Z9889 Other specified postprocedural states: Secondary | ICD-10-CM | POA: Insufficient documentation

## 2012-06-25 LAB — URINALYSIS, ROUTINE W REFLEX MICROSCOPIC
Bilirubin Urine: NEGATIVE
Nitrite: NEGATIVE
Protein, ur: NEGATIVE mg/dL
Specific Gravity, Urine: 1.01 (ref 1.005–1.030)
Urobilinogen, UA: 0.2 mg/dL (ref 0.0–1.0)

## 2012-06-25 LAB — CBC WITH DIFFERENTIAL/PLATELET
Hemoglobin: 11.9 g/dL — ABNORMAL LOW (ref 12.0–15.0)
Lymphocytes Relative: 19 % (ref 12–46)
Lymphs Abs: 1.5 10*3/uL (ref 0.7–4.0)
MCH: 28.1 pg (ref 26.0–34.0)
Monocytes Relative: 10 % (ref 3–12)
Neutro Abs: 5.5 10*3/uL (ref 1.7–7.7)
Neutrophils Relative %: 69 % (ref 43–77)
Platelets: 267 10*3/uL (ref 150–400)
RBC: 4.24 MIL/uL (ref 3.87–5.11)
WBC: 7.9 10*3/uL (ref 4.0–10.5)

## 2012-06-25 LAB — COMPREHENSIVE METABOLIC PANEL
ALT: 16 U/L (ref 0–35)
Alkaline Phosphatase: 44 U/L (ref 39–117)
BUN: 9 mg/dL (ref 6–23)
CO2: 27 mEq/L (ref 19–32)
Chloride: 100 mEq/L (ref 96–112)
GFR calc Af Amer: >90 mL/min (ref >90)
Glucose, Bld: 98 mg/dL (ref 70–99)
Potassium: 3.5 mEq/L (ref 3.5–5.1)
Sodium: 138 mEq/L (ref 135–145)
Total Bilirubin: 0.5 mg/dL (ref 0.3–1.2)

## 2012-06-25 MED ORDER — FENTANYL CITRATE 0.05 MG/ML IJ SOLN
100.0000 ug | Freq: Once | INTRAMUSCULAR | Status: AC
  Administered 2012-06-25: 50 ug via INTRAVENOUS
  Filled 2012-06-25: qty 2

## 2012-06-25 MED ORDER — DIPHENHYDRAMINE HCL 50 MG/ML IJ SOLN
25.0000 mg | Freq: Once | INTRAMUSCULAR | Status: AC
  Administered 2012-06-25: 25 mg via INTRAVENOUS
  Filled 2012-06-25: qty 1

## 2012-06-25 NOTE — ED Notes (Signed)
Last of 25 mcg Fentanyl from prior order of for 100 mcg given for continued HA

## 2012-06-25 NOTE — ED Provider Notes (Signed)
History     CSN: [id number]  Arrival date & time 06/25/12  1522   First MD Initiated Contact with Patient 06/25/12 1623      Chief Complaint  Patient presents with  . Post-op Problem   Patient is a 41 year old female with past medical history relevant for recent breast reconstruction 2 days ago who presents to the emergency department with severe headache. Patient states that postop she did well other than commenting on her having fluid buildup and she was discharged yesterday. Patient states that at 8 AM, she began having diffuse headache. Pain was mild and has gradually worsened and currently 10 out of 10 in severity and pulsatile in quality. Pain awoke patient from sleep. Associated symptoms include 5 episodes of nonbloody nonbilious emesis. Patient also complains of intermittent left eye blurry vision.  Patient denies the presence of fevers, shortness of breath, and she states that her abdominal pain and breast pain is at baseline from postop status.    (Consider location/radiation/quality/duration/timing/severity/associated sxs/prior treatment) HPI  Past Medical History  Diagnosis Date  . Hodgkin lymphoma     STATUS POST MANTLE RADIATION  . Breast cancer     STATUS POST BILATERAL MASTECTOMY. STATUS POST RECONSTRUCTION. SHE HAD SILICONE BREAST IMPLANTS AND THE LEFT IMPLANT IS LEAKING SLIGHTLY  . Thyroid cancer     STATUS POST SURGICAL REMOVAL-CURRENT ON THYROID REPLACEMENT  . Adrenal insufficiency   . Tachycardia   . Palpitations   . Chest pain   . Hypertension   . Raynaud phenomenon   . Appendicitis 12/19/09  . GERD (gastroesophageal reflux disease)   . Aortic stenosis   . Aortic stenosis   . SVT (supraventricular tachycardia)     Past Surgical History  Procedure Date  . Pituitary surgery   . Cardiac catheterization 05/18/09    NORMAL CATH  . Appendectomy   . Abdominal hysterectomy     History reviewed. No pertinent family history.  History  Substance Use  Topics  . Smoking status: Never Smoker   . Smokeless tobacco: Not on file  . Alcohol Use: Yes     occassionally    OB History    Grav Para Term Preterm Abortions TAB SAB Ect Mult Living                  Review of Systems  All other systems reviewed and are negative.    Allergies  Compazine; Cymbalta; Dilaudid; Lorazepam; Metoclopramide hcl; Morphine and related; Other; Promethazine hcl; Tegaderm ag mesh; and Zofran  Home Medications   Current Outpatient Rx  Name Route Sig Dispense Refill  . CEPHALEXIN 500 MG PO CAPS Oral Take 500 mg by mouth 4 (four) times daily. Started medication on 06-24-12    . CLONAZEPAM 0.5 MG PO TABS Oral Take 0.5 mg by mouth 2 (two) times daily as needed. For anxiety.    . DEXLANSOPRAZOLE 60 MG PO CPDR Oral Take 60 mg by mouth daily.      Marland Kitchen HYDROCODONE-ACETAMINOPHEN 5-325 MG PO TABS Oral Take 1 tablet by mouth every 6 (six) hours as needed. For pain    . HYDROCORTISONE 5 MG PO TABS Oral Take 5 mg by mouth daily.    Marland Kitchen LEVOTHYROXINE SODIUM 100 MCG PO TABS Oral Take 100 mcg by mouth daily.      Marland Kitchen METOPROLOL SUCCINATE ER 25 MG PO TB24 Oral Take 25 mg by mouth daily. Take one tab alternating with two tablets every other day    . PROPRANOLOL HCL 10  MG PO TABS Oral Take 10 mg by mouth as needed. For heart palpitations.    Marland Kitchen ZOLPIDEM TARTRATE ER 12.5 MG PO TBCR Oral Take 12.5 mg by mouth at bedtime.        BP 146/84  Pulse 90  Temp 97.8 F (36.6 C) (Oral)  Resp 16  SpO2 99%  Physical Exam  Nursing note and vitals reviewed. Constitutional: She is oriented to person, place, and time. She appears well-developed and well-nourished.  HENT:  Head: Normocephalic and atraumatic.  Right Ear: External ear normal.  Left Ear: External ear normal.  Nose: Nose normal.  Mouth/Throat: Oropharynx is clear and moist.  Eyes: Conjunctivae normal and EOM are normal. Pupils are equal, round, and reactive to light.  Neck: Normal range of motion. Neck supple.    Cardiovascular: Normal rate, regular rhythm and intact distal pulses.  Exam reveals no gallop and no friction rub.   Pulmonary/Chest: Effort normal and breath sounds normal. No respiratory distress. She has no wheezes. She exhibits tenderness (moderate diffuse TTP).    Abdominal: Soft. Bowel sounds are normal. She exhibits distension (mild). She exhibits no mass. There is tenderness (mild diffuse TTP). There is no rebound and no guarding.  Musculoskeletal: Normal range of motion. She exhibits no edema and no tenderness.  Neurological: She is alert and oriented to person, place, and time. She has normal reflexes. She displays normal reflexes. No cranial nerve deficit. She exhibits normal muscle tone.  Skin: Skin is warm and dry.  Psychiatric: She has a normal mood and affect.    ED Course  Procedures (including critical care time)  Labs Reviewed  CBC WITH DIFFERENTIAL - Abnormal; Notable for the following:    Hemoglobin 11.9 (*)     All other components within normal limits  URINALYSIS, ROUTINE W REFLEX MICROSCOPIC - Abnormal; Notable for the following:    Ketones, ur 15 (*)     All other components within normal limits  COMPREHENSIVE METABOLIC PANEL   Ct Head Wo Contrast  06/25/2012  *RADIOLOGY REPORT*  Clinical Data: 41 year old female vomiting, worst headache of life. Dizziness.  CT HEAD WITHOUT CONTRAST  Technique:  Contiguous axial images were obtained from the base of the skull through the vertex without contrast.  Comparison: Brain MRI 03/17/2010.  Head CT 03/17/2010.  Findings: Visualized paranasal sinuses and mastoids are clear. Visualized orbits and scalp soft tissues are within normal limits. No acute osseous abnormality identified.  Cerebral volume is within normal limits for age.  No midline shift, ventriculomegaly, mass effect, evidence of mass lesion, intracranial hemorrhage or evidence of cortically based acute infarction.  Gray-white matter differentiation is within normal  limits throughout the brain.  No suspicious intracranial vascular hyperdensity.  IMPRESSION: Normal noncontrast CT appearance of the brain.   Original Report Authenticated By: Randall An, M.D.      1. Headache   2. Nausea and vomiting       MDM    Patient is a 41 y.o. female who is post op day 2 from breast reconstruction and presents with headache and n/v.  Upon arrival in the ED, patient noted to be AF and VS unremarkable.  On exam, patient with no neurological deficits, incisions c/d/i, and otherwise as above and non contributory.  Patient with history of migraine HAs, but due to increasing severity head CT and basic labs felt to be warranted.  Review of results wnl.  To further rule out [facility] or other IC pathology, patient was offered LP.  She declined and stated that HA was more c/w migraine HA.  Patient treated with benadryl and fentanyl (multiple allergies) and s/p treatment HA had improved. Patient discharged without acute events.          Corlis Leak, MD 06/25/12 2018

## 2012-06-25 NOTE — ED Provider Notes (Signed)
I have supervised the resident on the management of this patient and agree with the note above. I personally interviewed and examined the patient and my addendum is below.   [name] is a 41 y.o. female recent breast reconstruction here with HA. Gradually worsening HA since the AM and vomiting. Talked to her surgeon who sent her in for r/o elevated cerebral pressure. Her neuro exam is nl. CT head nl. Headache improved with reglan. I offered her lumbar puncture for measuring cerebral pressure and to r/o SAH (low suspicion of), and she declined. Return precautions given.    Wandra Arthurs, MD 06/25/12 2352

## 2012-06-25 NOTE — ED Notes (Signed)
Breast reconstruction surgery from ruptured implants done Monday, pt was d/c yesterday from [facility]. Taking keflex and vicodin at home. today pt is vomiting," worst headache of life" that began this am and has progressively worsened.  Answering all questions appropriately, PERRL, alert and oriented.

## 2012-06-25 NOTE — ED Notes (Signed)
Gave pt Fentanyl 66mg of prior order for 1077m for continued HA.

## 2012-06-25 NOTE — ED Notes (Signed)
EDP made aware of pt severe pain. Pupils equal and reactive.

## 2012-09-03 ENCOUNTER — Ambulatory Visit: Payer: Self-pay | Admitting: Physician Assistant

## 2012-09-09 ENCOUNTER — Ambulatory Visit: Payer: Self-pay | Admitting: Physician Assistant

## 2012-12-08 ENCOUNTER — Other Ambulatory Visit: Payer: Self-pay | Admitting: *Deleted

## 2012-12-08 MED ORDER — METOPROLOL SUCCINATE ER 25 MG PO TB24: 25.0000 mg | ORAL_TABLET | Freq: Every day | ORAL | Status: DC

## 2012-12-08 NOTE — Telephone Encounter (Signed)
Fax Received. Refill Completed. [name] (R.M.A)

## 2013-02-24 ENCOUNTER — Ambulatory Visit (INDEPENDENT_AMBULATORY_CARE_PROVIDER_SITE_OTHER): Payer: Commercial Managed Care - PPO | Admitting: Cardiovascular Disease

## 2013-02-24 ENCOUNTER — Encounter: Payer: Self-pay | Admitting: Cardiovascular Disease

## 2013-02-24 VITALS — BP 116/80 | HR 78 | Ht 65.0 in | Wt 140.8 lb

## 2013-02-24 DIAGNOSIS — C50919 Malignant neoplasm of unspecified site of unspecified female breast: Secondary | ICD-10-CM

## 2013-02-24 DIAGNOSIS — R002 Palpitations: Secondary | ICD-10-CM

## 2013-02-24 NOTE — Patient Instructions (Addendum)
Your physician wants you to follow-up in: 1 year  You will receive a reminder letter in the mail two months in advance. If you don't receive a letter, please call our office to schedule the follow-up appointment.  Your physician recommends that you continue on your current medications as directed. Please refer to the Current Medication list given to you today.

## 2013-02-24 NOTE — Assessment & Plan Note (Signed)
[name] is doing well.  Continue current meds.  I will see her in 1 year for evaluation.  Her CP has largely resolved.  The pains were likely due to costochrondritis due to the inflamed breast implants.

## 2013-02-24 NOTE — Progress Notes (Signed)
[name] Date of Birth  07-11-1971 [facility] [address]     [place],   [address] [phone]  Fax  [phone]  Problem list: 1. Palpitations/premature ventricular contractions 2.  history of Hodgkin's lymphoma-status post mantle radiation 3. History of breast cancer-status post Reconstruction 4. History of cervical cancer 5. History of pituitary tumor-status post surgical resection-2002 6. History of thyroidectomy 7. Raynaud's phenomenon 8. Appendectomy 9. History chest pains-normal heart catheterization, normal TEE 10. She has a history of adrenal insufficiency but her adrenal glands have started we function. She is no longer on steroids.  History of Present Illness:  [name] is done very well since I last saw her about a year ago. She has had some occasional palpitations that are typically controlled with metoprolol. She's not had any episodes of chest pain. She's been able to stop all of her steroids. Her adrenal glands have gradually improved and her now producing cortisol.  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  She is still working at [employer].   Feb 24, 2013:  She has done well since I last saw her .  She has had her breast implants replaced ( old ones were leaking).  Her costochondritis has resolved.  She has not been lifting.    She has run in 6 5K races this year.  She is playing tennis.    She is still in [employer] school.  She is doing her orthopedic rotation.     Current Outpatient Prescriptions on File Prior to Visit  Medication Sig Dispense Refill  . clonazePAM (KLONOPIN) 0.5 MG tablet Take 0.5 mg by mouth 2 (two) times daily as needed. For anxiety.      Marland Kitchen dexlansoprazole (DEXILANT) 60 MG capsule Take 60 mg by mouth daily.        . hydrocortisone (CORTEF) 5 MG tablet Take 5 mg by mouth daily.      Marland Kitchen levothyroxine  (SYNTHROID, LEVOTHROID) 100 MCG tablet Take 100 mcg by mouth daily.        . metoprolol succinate (TOPROL-XL) 25 MG 24 hr tablet Take 1 tablet (25 mg total) by mouth daily. Take one tab alternating with two tablets every other day  45 tablet  2  . zolpidem (AMBIEN CR) 12.5 MG CR tablet Take 12.5 mg by mouth at bedtime.         No current facility-administered medications on file prior to visit.    Allergies  Allergen Reactions  . Compazine     unknown  . Cymbalta (Duloxetine Hcl)     Rash   . Dilaudid (Hydromorphone Hcl)     unknown  . Lorazepam     unknown  . Metoclopramide Hcl     rash  . Morphine And Related     hives  . Other     TAPE ALLERGY  . Promethazine Hcl     hives  . Tegaderm Ag Mesh (Silver)     rash  . Zofran     hives    Past Medical History  Diagnosis Date  . Hodgkin lymphoma     STATUS POST MANTLE RADIATION  . Breast cancer     STATUS POST BILATERAL MASTECTOMY. STATUS POST RECONSTRUCTION. SHE HAD SILICONE BREAST IMPLANTS AND THE LEFT IMPLANT IS LEAKING SLIGHTLY  . Thyroid cancer     STATUS POST SURGICAL REMOVAL-CURRENT ON THYROID REPLACEMENT  .  Adrenal insufficiency   . Tachycardia   . Palpitations   . Chest pain   . Hypertension   . Raynaud phenomenon   . Appendicitis 12/19/09  . GERD (gastroesophageal reflux disease)   . Aortic stenosis   . Aortic stenosis   . SVT (supraventricular tachycardia)     Past Surgical History  Procedure Laterality Date  . Pituitary surgery    . Cardiac catheterization  05/18/09    NORMAL CATH  . Appendectomy    . Abdominal hysterectomy      History  Smoking status  . Never Smoker   Smokeless tobacco  . Not on file    History  Alcohol Use  . Yes    Comment: occassionally    No family history on file.  Reviw of Systems:  Reviewed in the HPI.  All other systems are negative.  Physical Exam: BP 116/80  Pulse 78  Ht _0  (1.651 m)  Wt 140 lb 12.8 oz (63.866 kg)  BMI 23.43 kg/m2 Recheck BP ws  124/80  The patient is alert and oriented x 3.  The mood and affect are normal.   Skin: warm and dry.  Color is normal.    HEENT:   the sclera are nonicteric.  The mucous membranes are moist.  The carotids are 2+ without bruits.  There is no thyromegaly.  There is no JVD.    Lungs: clear.  The chest wall is non tender.    Heart: She has had bilateral breast reconstruction.  regular rate with a normal S1 and S2.  She has a soft systolic murmur. The PMI is not displaced.     Abdomen: good bowel sounds.  There is no guarding or rebound.  There is no hepatosplenomegaly or tenderness.  There are no masses.   Extremities:  no clubbing, cyanosis, or edema.  The legs are without rashes.  The distal pulses are intact.   Neuro:  Cranial nerves II - XII are intact.  Motor and sensory functions are intact.    The gait is normal.  EKG: Feb 24, 2013:  NSR. normal  Assessment / Plan:

## 2013-03-19 ENCOUNTER — Other Ambulatory Visit: Payer: Self-pay | Admitting: Cardiovascular Disease

## 2013-06-30 ENCOUNTER — Other Ambulatory Visit: Payer: Self-pay | Admitting: *Deleted

## 2013-06-30 ENCOUNTER — Other Ambulatory Visit: Payer: Self-pay

## 2013-06-30 MED ORDER — METOPROLOL SUCCINATE ER 25 MG PO TB24: ORAL_TABLET | ORAL | Status: DC

## 2013-08-06 ENCOUNTER — Other Ambulatory Visit: Payer: Self-pay

## 2013-08-10 ENCOUNTER — Telehealth: Payer: Self-pay | Admitting: Cardiovascular Disease

## 2013-08-10 NOTE — Telephone Encounter (Signed)
PT WAS MADE AN APP IN [place]

## 2013-08-10 NOTE — Telephone Encounter (Signed)
New Problem  Pt called states that  hasnt felt well// Heart rate has been off// Made appt for 11/13 with Richardson Dopp. Please call if further attention is needed

## 2013-08-11 ENCOUNTER — Ambulatory Visit (INDEPENDENT_AMBULATORY_CARE_PROVIDER_SITE_OTHER): Payer: Commercial Managed Care - PPO | Admitting: Cardiovascular Disease

## 2013-08-11 ENCOUNTER — Encounter: Payer: Self-pay | Admitting: Cardiovascular Disease

## 2013-08-11 VITALS — BP 130/84 | HR 96 | Ht 65.0 in | Wt 141.0 lb

## 2013-08-11 DIAGNOSIS — R002 Palpitations: Secondary | ICD-10-CM

## 2013-08-11 DIAGNOSIS — R0789 Other chest pain: Secondary | ICD-10-CM

## 2013-08-11 MED ORDER — METOPROLOL SUCCINATE ER 50 MG PO TB24: ORAL_TABLET | ORAL | Status: DC

## 2013-08-11 MED ORDER — PROPRANOLOL HCL 10 MG PO TABS: 10.0000 mg | ORAL_TABLET | Freq: Four times a day (QID) | ORAL | Status: DC | PRN

## 2013-08-11 NOTE — Patient Instructions (Signed)
REFILL FOR PROPRANOLOL 10 MG TABLET ; YOU CAN TAKE 1 TABLET 4 TIMES DAILY AS NEEDED FOR PALPITATIONS  INCREASE METOPROLOL TO 50 MG IN THE MORNING  PLEASE SCHEDULE FOR ECHO TO BE DONE IN [place] OFFICE; DX CHEST TIGHTNESS  PLEASE FOLLOW UP WITH DR. Acie Fredrickson IN 3 MONTHS IN THE [place] OFFICE

## 2013-08-11 NOTE — Progress Notes (Signed)
[name] Date of Birth  Oct 24, 1970 [facility] [address]     [place], [place]  [address] [phone]  Fax  [phone]  Problem list: 1. Palpitations/premature ventricular contractions 2.  history of Hodgkin's lymphoma-status post mantle radiation 3. History of breast cancer-status post Reconstruction 4. History of cervical cancer 5. History of pituitary tumor-status post surgical resection-2002 6. History of thyroidectomy 7. Raynaud's phenomenon 8. Appendectomy 9. History chest pains-normal heart catheterization, normal TEE 10. She has a history of adrenal insufficiency but her adrenal glands have started we function. She is no longer on steroids.  History of Present Illness:  [name] is done very well since I last saw her about a year ago. She has had some occasional palpitations that are typically controlled with metoprolol. She's not had any episodes of chest pain. She's been able to stop all of her steroids. Her adrenal glands have gradually improved and her now producing cortisol.  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  She is still working at [employer].   Feb 24, 2013:  She has done well since I last saw her .  She has had her breast implants replaced ( old ones were leaking).  Her costochondritis has resolved.  She has not been lifting.    She has run in 6 5K races this year.  She is playing tennis.    She is still in [employer] school.  She is doing her orthopedic rotation.    nov. 11, 2014:  Her HR has been high.  She has a head ache frequently.  She started Adderall recently.   The adderall has help with her focus but she feels much worse in it.   She has been on the Adderall for 2 months and the tachycardia started about 1 week ago.  She was doing cycle classes 3 times a week.  She is exercising regularly and for the past week, her HR is  extremely fast.   She has gone into menapause and has lots of hot flashes.    Current Outpatient Prescriptions on File Prior to Visit  Medication Sig Dispense Refill  . clonazePAM (KLONOPIN) 0.5 MG tablet Take 0.5 mg by mouth 2 (two) times daily as needed. For anxiety.      Marland Kitchen dexlansoprazole (DEXILANT) 60 MG capsule Take 60 mg by mouth daily.        . hydrocortisone (CORTEF) 5 MG tablet Take 5 mg by mouth daily.      Marland Kitchen levothyroxine (SYNTHROID, LEVOTHROID) 100 MCG tablet Take 100 mcg by mouth daily.        . metoprolol succinate (TOPROL-XL) 25 MG 24 hr tablet TAKE 1 TABLET BY MOUTH ONCE DAILY.  30 tablet  6  . zolpidem (AMBIEN CR) 12.5 MG CR tablet Take 12.5 mg by mouth at bedtime.         No current facility-administered medications on file prior to visit.    Allergies  Allergen Reactions  . Compazine     unknown  . Cymbalta [Duloxetine Hcl]     Rash   . Dilaudid [Hydromorphone Hcl]     unknown  . Lorazepam     unknown  . Metoclopramide Hcl     rash  . Morphine And Related     hives  . Other     TAPE ALLERGY  . Promethazine Hcl  hives  . Tegaderm Ag Mesh [Silver]     rash  . Zofran     hives    Past Medical History  Diagnosis Date  . Hodgkin lymphoma     STATUS POST MANTLE RADIATION  . Breast cancer     STATUS POST BILATERAL MASTECTOMY. STATUS POST RECONSTRUCTION. SHE HAD SILICONE BREAST IMPLANTS AND THE LEFT IMPLANT IS LEAKING SLIGHTLY  . Thyroid cancer     STATUS POST SURGICAL REMOVAL-CURRENT ON THYROID REPLACEMENT  . Adrenal insufficiency   . Tachycardia   . Palpitations   . Chest pain   . Hypertension   . Raynaud phenomenon   . Appendicitis 12/19/09  . GERD (gastroesophageal reflux disease)   . Aortic stenosis   . Aortic stenosis   . SVT (supraventricular tachycardia)     Past Surgical History  Procedure Laterality Date  . Pituitary surgery    . Cardiac catheterization  05/18/09    NORMAL CATH  . Appendectomy    . Abdominal hysterectomy       History  Smoking status  . Never Smoker   Smokeless tobacco  . Not on file    History  Alcohol Use  . Yes    Comment: occassionally    Family History  Problem Relation Age of Onset  . Family history unknown: Yes    Reviw of Systems:  Reviewed in the HPI.  All other systems are negative.  Physical Exam: BP 130/84  Pulse 96  Ht _0  (1.651 m)  Wt 141 lb (63.957 kg)  BMI 23.46 kg/m2 Recheck BP ws 124/80  The patient is alert and oriented x 3.  The mood and affect are normal.   Skin: warm and dry.  Color is normal.    HEENT:   the sclera are nonicteric.  The mucous membranes are moist.  The carotids are 2+ without bruits.  There is no thyromegaly.  There is no JVD.    Lungs: clear.  The chest wall is non tender.    Heart: She has had bilateral breast reconstruction.  regular rate with a normal S1 and S2.  She has a 2-9/2  systolic murmur. The PMI is not displaced.     Abdomen: good bowel sounds.  There is no guarding or rebound.  There is no hepatosplenomegaly or tenderness.  There are no masses.   Extremities:  no clubbing, cyanosis, or edema.  The legs are without rashes.  The distal pulses are intact.   Neuro:  Cranial nerves II - XII are intact.  Motor and sensory functions are intact.    The gait is normal.  EKG: Nov. 11, 2014. NSR at 96.  No st or St wave changes   Assessment / Plan:

## 2013-08-11 NOTE — Assessment & Plan Note (Signed)
Presents today with increased palpitations and tachycardia. She has been started on Adderall since I last saw her. In addition, she also is having symptoms are consistent with menopause. She went into menopause early tissue.  I think it we should probably discontinue the Adderall to leave that up to her general medical doctor. For the time being, we will increase her Toprol to 50 mg a day. She will also continue to take propranolol and an as-needed basis.  We'll get an echocardiogram for further assessment of her LV function. She  Has a history of Hodgkin's disease as a child and had full mantle radiation. We will make sure that she doesn't have constrictive pericarditis.  See her again in 3 months for followup office visit.

## 2013-08-13 ENCOUNTER — Ambulatory Visit: Payer: Self-pay | Admitting: Physician Assistant

## 2013-08-21 ENCOUNTER — Other Ambulatory Visit (INDEPENDENT_AMBULATORY_CARE_PROVIDER_SITE_OTHER): Payer: Commercial Managed Care - PPO

## 2013-08-21 ENCOUNTER — Other Ambulatory Visit: Payer: Self-pay

## 2013-08-21 DIAGNOSIS — R079 Chest pain, unspecified: Secondary | ICD-10-CM

## 2013-08-21 DIAGNOSIS — R0789 Other chest pain: Secondary | ICD-10-CM

## 2013-08-24 ENCOUNTER — Telehealth: Payer: Self-pay

## 2013-08-24 NOTE — Telephone Encounter (Signed)
Left message for pt to call back.

## 2013-08-24 NOTE — Telephone Encounter (Signed)
Spoke w/ pt.  She is aware of results.  

## 2013-08-24 NOTE — Telephone Encounter (Signed)
Message copied by Stana Bunting on Mon Aug 24, 2013 11:00 AM ------      Message from: [place], [place] J      Created: Fri Aug 21, 2013  6:01 PM       triavial AI,             Trivial mr      Normal LV function       ------

## 2013-08-24 NOTE — Telephone Encounter (Signed)
Message copied by Stana Bunting on Mon Aug 24, 2013 11:09 AM ------      Message from: [place], [place] J      Created: Fri Aug 21, 2013  6:01 PM       triavial AI,             Trivial mr      Normal LV function       ------

## 2013-11-23 ENCOUNTER — Ambulatory Visit (INDEPENDENT_AMBULATORY_CARE_PROVIDER_SITE_OTHER): Payer: PRIVATE HEALTH INSURANCE | Admitting: Cardiovascular Disease

## 2013-11-23 ENCOUNTER — Encounter: Payer: Self-pay | Admitting: Cardiovascular Disease

## 2013-11-23 ENCOUNTER — Encounter: Payer: Self-pay | Admitting: *Deleted

## 2013-11-23 VITALS — BP 130/84 | HR 87 | Ht 65.5 in | Wt 144.0 lb

## 2013-11-23 DIAGNOSIS — R0789 Other chest pain: Secondary | ICD-10-CM

## 2013-11-23 DIAGNOSIS — R9431 Abnormal electrocardiogram [ECG] [EKG]: Secondary | ICD-10-CM

## 2013-11-23 DIAGNOSIS — R000 Tachycardia, unspecified: Secondary | ICD-10-CM

## 2013-11-23 DIAGNOSIS — R0602 Shortness of breath: Secondary | ICD-10-CM

## 2013-11-23 NOTE — Progress Notes (Signed)
[name] Date of Birth  17-Oct-1970 [facility] [address]     [place], [place]  [address] [phone]  Fax  [phone]  Problem list: 1. Palpitations/premature ventricular contractions 2.  history of Hodgkin's lymphoma-status post mantle radiation 3. History of breast cancer-status post Reconstruction 4. History of cervical cancer 5. History of pituitary tumor-status post surgical resection-2002 6. History of thyroidectomy 7. Raynaud's phenomenon 8. Appendectomy 9. History chest pains-normal heart catheterization, normal TEE 10.   history of adrenal insufficiency   History of Present Illness:  [name] is done very well since I last saw her about a year ago. She has had some occasional palpitations that are typically controlled with metoprolol. She's not had any episodes of chest pain. She's been able to stop all of her steroids. Her adrenal glands have gradually improved and her now producing cortisol.  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  She is still working at [employer].   Feb 24, 2013:  She has done well since I last saw her .  She has had her breast implants replaced ( old ones were leaking).  Her costochondritis has resolved.  She has not been lifting.    She has run in 6 5K races this year.  She is playing tennis.    She is still in [employer] school.  She is doing her orthopedic rotation.    nov. 11, 2014:  Her HR has been high.  She has a head ache frequently.  She started Adderall recently.   The adderall has help with her focus but she feels much worse in it.   She has been on the Adderall for 2 months and the tachycardia started about 1 week ago.  She was doing cycle classes 3 times a week.  She is exercising regularly and for the past week, her HR is extremely fast.   She has gone into menapause and has lots of hot flashes.   Feb. 23,  2015:  She has been having some recent episodes of tachycardia. We increased her metoprolol at the last visit. We performed an echocardiogram which revealed  Left ventricle: The cavity size was normal. Systolic function was normal. The estimated ejection fraction was in the range of 55% to 60%. Wall motion was normal; there were no regional wall motion abnormalities. Left ventricular diastolic function parameters were normal. - Aortic valve: Trivial regurgitation. - Mitral valve: Trivial regurgitation  She tried to stop her Adderall ( did not do well with that).   She decreased her dose slightly and she is not having much tachycardia. She has been   She has been found to have some osteoperosis and osteopenia.     Current Outpatient Prescriptions on File Prior to Visit  Medication Sig Dispense Refill  . amphetamine-dextroamphetamine (ADDERALL) 10 MG tablet Take 10 mg by mouth daily with breakfast.       . clonazePAM (KLONOPIN) 0.5 MG tablet Take 0.5 mg by mouth 2 (two) times daily as needed. For anxiety.      Marland Kitchen dexlansoprazole (DEXILANT) 60 MG capsule Take 60 mg by mouth daily.        Marland Kitchen L-Methylfolate 15 MG TABS Take by mouth daily.      Marland Kitchen levothyroxine (SYNTHROID, LEVOTHROID) 100 MCG tablet Take 100 mcg by mouth daily.        . metoprolol succinate (TOPROL-XL) 50 MG  24 hr tablet TAKE 1 TABLET BY MOUTH ONCE DAILY.  30 tablet  6  . propranolol (INDERAL) 10 MG tablet Take 1 tablet (10 mg total) by mouth 4 (four) times daily as needed.  50 tablet  3  . zolpidem (AMBIEN CR) 12.5 MG CR tablet Take 12.5 mg by mouth at bedtime.         No current facility-administered medications on file prior to visit.    Allergies  Allergen Reactions  . Compazine     unknown  . Cymbalta [Duloxetine Hcl]     Rash   . Dilaudid [Hydromorphone Hcl]     unknown  . Lorazepam     unknown  . Metoclopramide Hcl     rash  . Morphine And Related     hives  . Other     TAPE ALLERGY  . Promethazine Hcl      hives  . Tegaderm Ag Mesh [Silver]     rash  . Zofran     hives    Past Medical History  Diagnosis Date  . Hodgkin lymphoma     STATUS POST MANTLE RADIATION  . Breast cancer     STATUS POST BILATERAL MASTECTOMY. STATUS POST RECONSTRUCTION. SHE HAD SILICONE BREAST IMPLANTS AND THE LEFT IMPLANT IS LEAKING SLIGHTLY  . Thyroid cancer     STATUS POST SURGICAL REMOVAL-CURRENT ON THYROID REPLACEMENT  . Adrenal insufficiency   . Tachycardia   . Palpitations   . Chest pain   . Hypertension   . Raynaud phenomenon   . Appendicitis 12/19/09  . GERD (gastroesophageal reflux disease)   . Aortic stenosis   . Aortic stenosis   . SVT (supraventricular tachycardia)   . Osteoporosis     Past Surgical History  Procedure Laterality Date  . Pituitary surgery    . Cardiac catheterization  05/18/09    NORMAL CATH  . Appendectomy    . Abdominal hysterectomy      History  Smoking status  . Never Smoker   Smokeless tobacco  . Not on file    History  Alcohol Use  . Yes    Comment: occassionally    Family History  Problem Relation Age of Onset  . Family history unknown: Yes    Reviw of Systems:  Reviewed in the HPI.  All other systems are negative.  Physical Exam: BP 130/84  Pulse 87  Ht 5' 5.5" (1.664 m)  Wt 144 lb (65.318 kg)  BMI 23.59 kg/m2 Recheck BP ws 124/80  The patient is alert and oriented x 3.  The mood and affect are normal.   Skin: warm and dry.  Color is normal.    HEENT:   the sclera are nonicteric.  The mucous membranes are moist.  The carotids are 2+ without bruits.  There is no thyromegaly.  There is no JVD.    Lungs: clear.  The chest wall is non tender.    Heart: She has had bilateral breast reconstruction.  regular rate with a normal S1 and S2.  She has a 2-5/3  systolic murmur. The PMI is not displaced.     Abdomen: good bowel sounds.  There is no guarding or rebound.  There is no hepatosplenomegaly or tenderness.  There are no masses.    Extremities:  no clubbing, cyanosis, or edema.  The legs are without rashes.  The distal pulses are intact.   Neuro:  Cranial nerves II - XII are intact.  Motor and sensory functions are  intact.    The gait is normal.  EKG: Feb. 23, 2015:  NSR:   She has T wave inversion in the inferior and lateral leads which are new from her previous tracing   Assessment / Plan:

## 2013-11-23 NOTE — Patient Instructions (Addendum)
Your physician has requested that you have a lexiscan myoview. For further information please visit [email or web address]. Please follow instruction sheet, as given.   [place]  Your caregiver has ordered a Stress Test with nuclear imaging. The purpose of this test is to evaluate the blood supply to your heart muscle. This procedure is referred to as a "Non-Invasive Stress Test." This is because other than having an IV started in your vein, nothing is inserted or "invades" your body. Cardiac stress tests are done to find areas of poor blood flow to the heart by determining the extent of coronary artery disease (CAD). Some patients exercise on a treadmill, which naturally increases the blood flow to your heart, while others who are  unable to walk on a treadmill due to physical limitations have a pharmacologic/chemical stress agent called Lexiscan . This medicine will mimic walking on a treadmill by temporarily increasing your coronary blood flow.   Please note: these test may take anywhere between 2-4 hours to complete  Date of Procedure:____________3/4/15_________________________  Arrival Time for Procedure:_________0745 am_____________________  Instructions regarding medication:   How to prepare for your Myoview test:  1. Do not eat or drink after midnight 2. No caffeine for 24 hours prior to test 3. No smoking 24 hours prior to test. 4. Your medication may be taken with water.  If your doctor stopped a medication because of this test, do not take that medication. 5. Ladies, please do not wear dresses.  Skirts or pants are appropriate. Please wear a short sleeve shirt. 6. No perfume, cologne or lotion. 7. Wear comfortable walking shoes. No heels!  Your physician recommends that you return for lab work in:  Today  BMP Lipid and Liver Panel   Your physician recommends that you schedule a follow-up appointment in:  2 months with EKG

## 2013-11-23 NOTE — Assessment & Plan Note (Signed)
[name] has been having some vague episodes of chest pain. She describes some pleuritic pain on occasion. She does describe increasing shortness breath particularly with exertion. For the past 2 weeks she's not been able to complete her spin class at the gym.  Her EKG today shows T-wave inversions in the inferior and  lateral leads which is new from previous tracings. She's had a cardiac catheterization in the past which showed  smooth and normal coronary arteries.  She's had a history of Hodgkin's lymphoma and has had mantal radiation.  We will schedule her for a lexiscan myoview.  I would have a low threshold to repeat her cath since she is at risk for radiation induced CAD.   She is not hacving any  Cp at this moment.   She  was recently found to have a pelvic mass. She has a history of multiple cancers. To be getting further evaluation including CT scan and PET scan very soon.   We'll draw fasting lipids, liver enzymes, and basic metabolic profile today.

## 2013-11-24 LAB — HEPATIC FUNCTION PANEL
ALK PHOS: 44 IU/L (ref 39–117)
ALT: 26 IU/L (ref 0–32)
AST: 22 IU/L (ref 0–40)
Albumin: 4.3 g/dL (ref 3.5–5.5)
Bilirubin, Direct: 0.07 mg/dL (ref 0.00–0.40)
TOTAL PROTEIN: 7 g/dL (ref 6.0–8.5)
Total Bilirubin: 0.3 mg/dL (ref 0.0–1.2)

## 2013-11-24 LAB — LIPID PANEL
CHOLESTEROL TOTAL: 238 mg/dL — AB (ref 100–199)
Chol/HDL Ratio: 3.8 ratio units (ref 0.0–4.4)
HDL: 62 mg/dL (ref >39)
LDL CALC: 157 mg/dL — AB (ref 0–99)
Triglycerides: 93 mg/dL (ref 0–149)
VLDL CHOLESTEROL CAL: 19 mg/dL (ref 5–40)

## 2013-11-24 LAB — BASIC METABOLIC PANEL
BUN/Creatinine Ratio: 15 (ref 9–23)
BUN: 13 mg/dL (ref 6–24)
CHLORIDE: 100 mmol/L (ref 97–108)
CO2: 26 mmol/L (ref 18–29)
Calcium: 10 mg/dL (ref 8.7–10.2)
Creatinine, Ser: 0.85 mg/dL (ref 0.57–1.00)
GFR calc Af Amer: 98 mL/min/{1.73_m2} (ref >59)
GFR calc non Af Amer: 85 mL/min/{1.73_m2} (ref >59)
GLUCOSE: 91 mg/dL (ref 65–99)
Potassium: 5.1 mmol/L (ref 3.5–5.2)
Sodium: 140 mmol/L (ref 134–144)

## 2013-12-02 ENCOUNTER — Encounter (HOSPITAL_COMMUNITY): Payer: Self-pay

## 2013-12-02 ENCOUNTER — Telehealth: Payer: Self-pay | Admitting: *Deleted

## 2013-12-02 NOTE — Telephone Encounter (Signed)
Dr Acie Fredrickson wanted pt to know he received her msg that she had fallen/ slipped on ice and cancelled her stress test/ I asked her to call as soon as she was feeling better and reschedule. Pt agreed to plan.

## 2013-12-07 ENCOUNTER — Encounter: Payer: Self-pay | Admitting: Cardiovascular Disease

## 2013-12-07 ENCOUNTER — Ambulatory Visit (INDEPENDENT_AMBULATORY_CARE_PROVIDER_SITE_OTHER): Payer: PRIVATE HEALTH INSURANCE | Admitting: Cardiovascular Disease

## 2013-12-07 VITALS — BP 108/78 | HR 80 | Ht 65.5 in

## 2013-12-07 DIAGNOSIS — R9431 Abnormal electrocardiogram [ECG] [EKG]: Secondary | ICD-10-CM

## 2013-12-07 DIAGNOSIS — R0789 Other chest pain: Secondary | ICD-10-CM

## 2013-12-07 DIAGNOSIS — E785 Hyperlipidemia, unspecified: Secondary | ICD-10-CM

## 2013-12-07 NOTE — Progress Notes (Signed)
Discussed in the office today

## 2013-12-07 NOTE — Assessment & Plan Note (Addendum)
She's no longer having any episodes of shortness breath with exertion. EKG is normal. I suspect that the abnormal EKG may be due to lead placement.  We had a long discussion about her discomfort.  She is breathing OK now.  She has been working out without CP or dyspnea.  Her ECG is now normal.  We will cancel the Myoview study.  I will see her in 3 months

## 2013-12-07 NOTE — Progress Notes (Signed)
[name] Date of Birth  [birth year] [facility] [address]     [place], [place]  [address] [phone]  Fax  [phone]  Problem list: 1. Palpitations/premature ventricular contractions 2.  history of Hodgkin's lymphoma-status post mantle radiation 3. History of breast cancer-status post Reconstruction 4. History of cervical cancer 5. History of pituitary tumor-status post surgical resection-2002 6. History of thyroidectomy 7. Raynaud's phenomenon 8. Appendectomy 9. History chest pains-normal heart catheterization, normal TEE 10.   history of adrenal insufficiency   History of Present Illness:  [name] is done very well since I last saw her about a year ago. She has had some occasional palpitations that are typically controlled with metoprolol. She's not had any episodes of chest pain. She's been able to stop all of her steroids. Her adrenal glands have gradually improved and her now producing cortisol.  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  She is still working at [employer].   Feb 24, 2013:  She has done well since I last saw her .  She has had her breast implants replaced ( old ones were leaking).  Her costochondritis has resolved.  She has not been lifting.    She has run in 6 5K races this year.  She is playing tennis.    She is still in [employer] school.  She is doing her orthopedic rotation.    nov. 11, 2014:  Her HR has been high.  She has a head ache frequently.  She started Adderall recently.   The adderall has help with her focus but she feels much worse in it.   She has been on the Adderall for 2 months and the tachycardia started about 1 week ago.  She was doing cycle classes 3 times a week.  She is exercising regularly and for the past week, her HR is extremely fast.   She has gone into menapause and has lots of hot flashes.   Feb. 23,  2015:  She has been having some recent episodes of tachycardia. We increased her metoprolol at the last visit. We performed an echocardiogram which revealed  Left ventricle: The cavity size was normal. Systolic function was normal. The estimated ejection fraction was in the range of 55% to 60%. Wall motion was normal; there were no regional wall motion abnormalities. Left ventricular diastolic function parameters were normal. - Aortic valve: Trivial regurgitation. - Mitral valve: Trivial regurgitation  She tried to stop her Adderall ( did not do well with that).   She decreased her dose slightly and she is not having much tachycardia. She has been   She has been found to have some osteoperosis and osteopenia.     Current Outpatient Prescriptions on File Prior to Visit  Medication Sig Dispense Refill  . amphetamine-dextroamphetamine (ADDERALL) 10 MG tablet Take 10 mg by mouth daily with breakfast.       . clonazePAM (KLONOPIN) 0.5 MG tablet Take 0.5 mg by mouth 2 (two) times daily as needed. For anxiety.      Marland Kitchen dexlansoprazole (DEXILANT) 60 MG capsule Take 60 mg by mouth daily.        . hydrocortisone (CORTEF) 10 MG tablet Take 10-15 mg by mouth 2 (two) times daily.      Marland Kitchen L-Methylfolate 15 MG TABS Take by mouth daily.      Marland Kitchen levothyroxine (SYNTHROID, LEVOTHROID) 100 MCG  tablet Take 100 mcg by mouth daily.        . metoprolol succinate (TOPROL-XL) 50 MG 24 hr tablet TAKE 1 TABLET BY MOUTH ONCE DAILY.  30 tablet  6  . propranolol (INDERAL) 10 MG tablet Take 1 tablet (10 mg total) by mouth 4 (four) times daily as needed.  50 tablet  3  . zolpidem (AMBIEN CR) 12.5 MG CR tablet Take 12.5 mg by mouth at bedtime.         No current facility-administered medications on file prior to visit.    Allergies  Allergen Reactions  . Compazine     unknown  . Cymbalta [Duloxetine Hcl]     Rash   . Dilaudid [Hydromorphone Hcl]     unknown  . Lorazepam     unknown  . Metoclopramide Hcl      rash  . Morphine And Related     hives  . Other     TAPE ALLERGY  . Promethazine Hcl     hives  . Tegaderm Ag Mesh [Silver]     rash  . Zofran     hives    Past Medical History  Diagnosis Date  . Hodgkin lymphoma     STATUS POST MANTLE RADIATION  . Breast cancer     STATUS POST BILATERAL MASTECTOMY. STATUS POST RECONSTRUCTION. SHE HAD SILICONE BREAST IMPLANTS AND THE LEFT IMPLANT IS LEAKING SLIGHTLY  . Thyroid cancer     STATUS POST SURGICAL REMOVAL-CURRENT ON THYROID REPLACEMENT  . Adrenal insufficiency   . Tachycardia   . Palpitations   . Chest pain   . Hypertension   . Raynaud phenomenon   . Appendicitis 12/19/09  . GERD (gastroesophageal reflux disease)   . Aortic stenosis   . Aortic stenosis   . SVT (supraventricular tachycardia)   . Osteoporosis     Past Surgical History  Procedure Laterality Date  . Pituitary surgery    . Cardiac catheterization  05/18/09    NORMAL CATH  . Appendectomy    . Abdominal hysterectomy      History  Smoking status  . Never Smoker   Smokeless tobacco  . Not on file    History  Alcohol Use  . Yes    Comment: occassionally    No family history on file.  Reviw of Systems:  Reviewed in the HPI.  All other systems are negative.  Physical Exam: BP 108/78  Pulse 80  Ht 5' 5.5" (1.664 m) Recheck BP ws 124/80  The patient is alert and oriented x 3.  The mood and affect are normal.   Skin: warm and dry.  Color is normal.    HEENT:   the sclera are nonicteric.  The mucous membranes are moist.  The carotids are 2+ without bruits.  There is no thyromegaly.  There is no JVD.    Lungs: clear.  The chest wall is non tender.    Heart: She has had bilateral breast reconstruction.  regular rate with a normal S1 and S2.  She has a 8-3/4  systolic murmur. The PMI is not displaced.     Abdomen: good bowel sounds.  There is no guarding or rebound.  There is no hepatosplenomegaly or tenderness.  There are no masses.   Extremities:   no clubbing, cyanosis, or edema.  The legs are without rashes.  The distal pulses are intact.   Neuro:  Cranial nerves II - XII are intact.  Motor and sensory functions are intact.  The gait is normal.  EKG: Feb. 23, 2015:  NSR:   She has T wave inversion in the inferior and lateral leads which are new from her previous tracing December 07, 2013:  NSR at 22.  No T wave inversions present.   Assessment / Plan:

## 2013-12-07 NOTE — Assessment & Plan Note (Signed)
Following

## 2013-12-07 NOTE — Patient Instructions (Signed)
Your physician recommends that you return for a FASTING lipid profile: tomorrow  Your physician recommends that you schedule a follow-up appointment in: 3 months in [place] recommends that you continue on your current medications as directed. Please refer to the Current Medication list given to you today.

## 2013-12-08 ENCOUNTER — Other Ambulatory Visit: Payer: PRIVATE HEALTH INSURANCE

## 2013-12-08 DIAGNOSIS — R9431 Abnormal electrocardiogram [ECG] [EKG]: Secondary | ICD-10-CM

## 2013-12-08 DIAGNOSIS — E785 Hyperlipidemia, unspecified: Secondary | ICD-10-CM

## 2013-12-09 LAB — NMR LIPOPROFILE WITH LIPIDS
Cholesterol, Total: 207 mg/dL — ABNORMAL HIGH (ref <200)
HDL Particle Number: 30.2 umol/L — ABNORMAL LOW (ref >=30.5)
HDL SIZE: 9.3 nm (ref >=9.2)
HDL-C: 57 mg/dL (ref >=40)
LDL CALC: 131 mg/dL — AB (ref <100)
LDL PARTICLE NUMBER: 1384 nmol/L — AB (ref <1000)
LDL SIZE: 22 nm (ref >20.5)
Large HDL-P: 7.3 umol/L (ref >=4.8)
Large VLDL-P: 1.1 nmol/L (ref <=2.7)
SMALL LDL PARTICLE NUMBER: 268 nmol/L (ref <=527)
Triglycerides: 96 mg/dL (ref <150)
VLDL SIZE: 37.1 nm (ref <=46.6)

## 2013-12-15 NOTE — Progress Notes (Signed)
We have decided to wait and not start statin at this point. She had lots of problems the last time she tried statin.

## 2014-01-26 ENCOUNTER — Ambulatory Visit: Payer: Self-pay | Admitting: Cardiovascular Disease

## 2014-03-09 ENCOUNTER — Ambulatory Visit (INDEPENDENT_AMBULATORY_CARE_PROVIDER_SITE_OTHER): Payer: PRIVATE HEALTH INSURANCE | Admitting: Cardiovascular Disease

## 2014-03-09 ENCOUNTER — Encounter: Payer: Self-pay | Admitting: Cardiovascular Disease

## 2014-03-09 VITALS — BP 114/88 | HR 91 | Ht 65.0 in | Wt 140.0 lb

## 2014-03-09 DIAGNOSIS — E785 Hyperlipidemia, unspecified: Secondary | ICD-10-CM

## 2014-03-09 DIAGNOSIS — I10 Essential (primary) hypertension: Secondary | ICD-10-CM

## 2014-03-09 NOTE — Progress Notes (Signed)
[name] Date of Birth  [birth year] [facility] [address]     [place], [place]  [address] [phone]  Fax  [phone]  Problem list: 1. Palpitations/premature ventricular contractions 2.  history of Hodgkin's lymphoma-status post mantle radiation 3. History of breast cancer-status post Reconstruction 4. History of cervical cancer 5. History of pituitary tumor-status post surgical resection-2002 6. History of thyroidectomy 7. Raynaud's phenomenon 8. Appendectomy 9. History chest pains-normal heart catheterization, normal TEE 10.   history of adrenal insufficiency   History of Present Illness:  [name] is done very well since I last saw her about a year ago. She has had some occasional palpitations that are typically controlled with metoprolol. She's not had any episodes of chest pain. She's been able to stop all of her steroids. Her adrenal glands have gradually improved and her now producing cortisol.  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  She is still working at [employer].   Feb 24, 2013:  She has done well since I last saw her .  She has had her breast implants replaced ( old ones were leaking).  Her costochondritis has resolved.  She has not been lifting.    She has run in 6 5K races this year.  She is playing tennis.    She is still in [employer] school.  She is doing her orthopedic rotation.    nov. 11, 2014:  Her HR has been high.  She has a head ache frequently.  She started Adderall recently.   The adderall has help with her focus but she feels much worse in it.   She has been on the Adderall for 2 months and the tachycardia started about 1 week ago.  She was doing cycle classes 3 times a week.  She is exercising regularly and for the past week, her HR is extremely fast.   She has gone into menapause and has lots of hot flashes.   Feb. 23,  2015:  She has been having some recent episodes of tachycardia. We increased her metoprolol at the last visit. We performed an echocardiogram which revealed  Left ventricle: The cavity size was normal. Systolic function was normal. The estimated ejection fraction was in the range of 55% to 60%. Wall motion was normal; there were no regional wall motion abnormalities. Left ventricular diastolic function parameters were normal. - Aortic valve: Trivial regurgitation. - Mitral valve: Trivial regurgitation  She tried to stop her Adderall ( did not do well with that).   She decreased her dose slightly and she is not having much tachycardia. She has been   She has been found to have some osteoperosis and osteopenia.    March 09, 2014:  [name] is doing ok.. No recent cardiac problems.   She has been very active and is feeling great.  Playing tennis on a regular basis.    Current Outpatient Prescriptions on File Prior to Visit  Medication Sig Dispense Refill  . clonazePAM (KLONOPIN) 0.5 MG tablet Take 0.5 mg by mouth 2 (two) times daily as needed. For anxiety.      Marland Kitchen dexlansoprazole (DEXILANT) 60 MG capsule Take 60 mg by mouth daily.        . hydrocortisone (CORTEF) 10 MG tablet Take 10-15 mg by mouth 2 (two) times daily.      Marland Kitchen L-Methylfolate 15 MG TABS Take by mouth  daily.      . levothyroxine (SYNTHROID, LEVOTHROID) 100 MCG tablet Take 100 mcg by mouth daily.        . metoprolol succinate (TOPROL-XL) 50 MG 24 hr tablet TAKE 1 TABLET BY MOUTH ONCE DAILY.  30 tablet  6  . propranolol (INDERAL) 10 MG tablet Take 1 tablet (10 mg total) by mouth 4 (four) times daily as needed.  50 tablet  3  . zolpidem (AMBIEN CR) 12.5 MG CR tablet Take 12.5 mg by mouth at bedtime.         No current facility-administered medications on file prior to visit.    Allergies  Allergen Reactions  . Compazine     unknown  . Cymbalta [Duloxetine Hcl]     Rash   . Dilaudid [Hydromorphone Hcl]     unknown  .  Lorazepam     unknown  . Metoclopramide Hcl     rash  . Morphine And Related     hives  . Other     TAPE ALLERGY  . Promethazine Hcl     hives  . Tegaderm Ag Mesh [Silver]     rash  . Zofran     hives    Past Medical History  Diagnosis Date  . Hodgkin lymphoma     STATUS POST MANTLE RADIATION  . Breast cancer     STATUS POST BILATERAL MASTECTOMY. STATUS POST RECONSTRUCTION. SHE HAD SILICONE BREAST IMPLANTS AND THE LEFT IMPLANT IS LEAKING SLIGHTLY  . Thyroid cancer     STATUS POST SURGICAL REMOVAL-CURRENT ON THYROID REPLACEMENT  . Adrenal insufficiency   . Tachycardia   . Palpitations   . Chest pain   . Hypertension   . Raynaud phenomenon   . Appendicitis 12/19/09  . GERD (gastroesophageal reflux disease)   . Aortic stenosis   . Aortic stenosis   . SVT (supraventricular tachycardia)   . Osteoporosis     Past Surgical History  Procedure Laterality Date  . Pituitary surgery    . Cardiac catheterization  05/18/09    NORMAL CATH  . Appendectomy    . Abdominal hysterectomy      History  Smoking status  . Never Smoker   Smokeless tobacco  . Not on file    History  Alcohol Use  . Yes    Comment: occassionally    Family History  Problem Relation Age of Onset  . Family history unknown: Yes    Reviw of Systems:  Reviewed in the HPI.  All other systems are negative.  Physical Exam: BP 114/88  Pulse 91  Ht _0  (1.651 m)  Wt 140 lb (63.504 kg)  BMI 23.30 kg/m2 Recheck BP ws 124/80  The patient is alert and oriented x 3.  The mood and affect are normal.   Skin: warm and dry.  Color is normal.    HEENT:   the sclera are nonicteric.  The mucous membranes are moist.  The carotids are 2+ without bruits.  There is no thyromegaly.  There is no JVD.    Lungs: clear.  The chest wall is non tender.    Heart: She has had bilateral breast reconstruction.  regular rate with a normal S1 and S2.  She has a 3-7/9  systolic murmur. The PMI is not displaced.      Abdomen: good bowel sounds.  There is no guarding or rebound.  There is no hepatosplenomegaly or tenderness.  There are no masses.   Extremities:  no clubbing,  cyanosis, or edema.  The legs are without rashes.  The distal pulses are intact.   Neuro:  Cranial nerves II - XII are intact.  Motor and sensory functions are intact.    The gait is normal.  EKG:  Assessment / Plan:

## 2014-03-09 NOTE — Assessment & Plan Note (Signed)
Her blood pressure is well controlled. Continue current medications.

## 2014-03-09 NOTE — Assessment & Plan Note (Signed)
Her last lipid levels were mildly elevated. Her LDL particle number is [id number]. She was intolerant to atorvastatin. May consider sending her to lipid clinic. We'll discuss this further at her next visit.

## 2014-03-09 NOTE — Patient Instructions (Addendum)
Fasting Lab Work  At [facility] [employer] Tuesday 03/16/14 7:30 am   Your physician wants you to follow-up in: 6 months You will receive a reminder letter in the mail two months in advance. If you don't receive a letter, please call our office to schedule the follow-up appointment.

## 2014-03-09 NOTE — Assessment & Plan Note (Signed)
She's not having significant palpitations. Continue to follow

## 2014-03-16 ENCOUNTER — Other Ambulatory Visit: Payer: Self-pay

## 2014-04-07 ENCOUNTER — Other Ambulatory Visit: Payer: Self-pay | Admitting: Cardiovascular Disease

## 2014-11-03 ENCOUNTER — Other Ambulatory Visit: Payer: Self-pay | Admitting: Internal Medicine

## 2014-11-16 ENCOUNTER — Ambulatory Visit (HOSPITAL_COMMUNITY): Payer: PRIVATE HEALTH INSURANCE | Attending: Cardiology | Admitting: Cardiology

## 2014-11-16 ENCOUNTER — Encounter: Payer: Self-pay | Admitting: Physician Assistant

## 2014-11-16 ENCOUNTER — Ambulatory Visit (INDEPENDENT_AMBULATORY_CARE_PROVIDER_SITE_OTHER): Payer: PRIVATE HEALTH INSURANCE | Admitting: Physician Assistant

## 2014-11-16 VITALS — BP 120/78 | HR 107 | Ht 65.0 in | Wt 145.8 lb

## 2014-11-16 DIAGNOSIS — R42 Dizziness and giddiness: Secondary | ICD-10-CM

## 2014-11-16 DIAGNOSIS — R0602 Shortness of breath: Secondary | ICD-10-CM | POA: Diagnosis present

## 2014-11-16 DIAGNOSIS — R0789 Other chest pain: Secondary | ICD-10-CM

## 2014-11-16 DIAGNOSIS — R002 Palpitations: Secondary | ICD-10-CM

## 2014-11-16 DIAGNOSIS — R609 Edema, unspecified: Secondary | ICD-10-CM | POA: Insufficient documentation

## 2014-11-16 MED ORDER — FUROSEMIDE 20 MG PO TABS: 20.0000 mg | ORAL_TABLET | Freq: Every day | ORAL | Status: DC

## 2014-11-16 NOTE — Assessment & Plan Note (Signed)
Patient has a lot of swelling in her hands feet and legs and wrists. Not sure what this is coming from but possible the Estratest. She is also on chronic steroids. Will give Lasix 20 mg once daily for 3-4 days to help with this. Checking labs as well. Recommend follow-up with endocrinologist and gynecologist.

## 2014-11-16 NOTE — Assessment & Plan Note (Addendum)
Patient complains of worsening palpitations but held her Toprol for the past 2 days because of low blood pressure. Her blood pressure is actually up a little here in the office lying was 133/85 went up to 144/93 with a heart rate of 120 standing. Recommend take her Toprol now, she can take Inderal as needed for more palpitations. Not sure if this Estratest is causing some of her problems but recommend her stopping it in contacting her gynecologist as well as her endocrinologist concerning her myriad of symptoms. Check 2-D echo to follow-up murmurs and tachycardia. She has a follow-up with Dr. Acie Fredrickson in about 2 weeks.

## 2014-11-16 NOTE — Assessment & Plan Note (Signed)
Patient has been dizzy for a couple days. It is at rest and not with quick movement or standing up. She is not orthostatic. She is tachycardic. Recommend resuming Toprol.

## 2014-11-16 NOTE — Patient Instructions (Addendum)
Your physician has recommended you make the following change in your medication:    CONTINUE TAKE TOPROL AS INSTRUCTED   STOP ESTRATACE  START LASIX 20 MG FOR 3 TO 4 DAYS TO HELP WITH FLUID RETAINMENT  LABS TODAY CMET AND CBC  Your physician has requested that you have an echocardiogram. Echocardiography is a painless test that uses sound waves to create images of your heart. It provides your doctor with information about the size and shape of your heart and how well your heart's chambers and valves are working. This procedure takes approximately one hour. There are no restrictions for this procedure.

## 2014-11-16 NOTE — Progress Notes (Signed)
Cardiology Office Note   Date:  11/16/2014   ID:  ADARA KITTLE, DOB 07/12/1971, MRN [id number]  PCP:  PROVIDER NOT IN SYSTEM  Cardiologist:  Grayland Jack, MD  Chief Complaint: palpitations and dizziness    History of Present Illness: [name] is a 43 y.o. female patient of Dr. Acie Fredrickson who presents for worsening palpitations, dizziness, and edema. Patient has a long history of palpitations and tachycardia controlled with metoprolol. She has a history of chest pain with normal cardiac catheterization in 2010 and normal TEE. She also has history of hypertension, history of Hodgkin's lymphoma status post Mantle radiation, breast cancer status post bilateral mastectomies and reconstruction, pituitary tumor resection in 2002, history of cervical cancer, and adrenal insufficiency.  Patient was started on Estratest 2 weeks ago for menopausal symptoms. Past several days she complains of significant swelling of her fingers wrist and legs and 5 or 6 pound weight gain. She also developed worsening of palpitations but has held her Toprol for the past 2 days because her blood pressure was in the 90s. She's also been dizzy all the time. She just doesn't feel right and feels like something is wrong. She denies any chest pain or significant dyspnea, presyncope. She increased her steroids yesterday because of some  of her symptoms.    Past Medical History  Diagnosis Date  . Hodgkin lymphoma     STATUS POST MANTLE RADIATION  . Breast cancer     STATUS POST BILATERAL MASTECTOMY. STATUS POST RECONSTRUCTION. SHE HAD SILICONE BREAST IMPLANTS AND THE LEFT IMPLANT IS LEAKING SLIGHTLY  . Thyroid cancer     STATUS POST SURGICAL REMOVAL-CURRENT ON THYROID REPLACEMENT  . Adrenal insufficiency   . Tachycardia   . Palpitations   . Chest pain   . Hypertension   . Raynaud phenomenon   . Appendicitis 12/19/09  . GERD (gastroesophageal reflux disease)   . Aortic stenosis   . Aortic stenosis   . SVT  (supraventricular tachycardia)   . Osteoporosis     Past Surgical History  Procedure Laterality Date  . Pituitary surgery    . Cardiac catheterization  05/18/09    NORMAL CATH  . Appendectomy    . Abdominal hysterectomy       Current Outpatient Prescriptions  Medication Sig Dispense Refill  . amphetamine-dextroamphetamine (ADDERALL) 20 MG tablet Take 20 mg by mouth daily.    . clonazePAM (KLONOPIN) 0.5 MG tablet Take 0.5 mg by mouth 2 (two) times daily as needed. For anxiety.    Marland Kitchen dexlansoprazole (DEXILANT) 60 MG capsule Take 60 mg by mouth daily.      . hydrocortisone (CORTEF) 10 MG tablet Take 10-15 mg by mouth 2 (two) times daily.    Marland Kitchen L-Methylfolate 15 MG TABS Take by mouth daily.    Marland Kitchen levothyroxine (SYNTHROID, LEVOTHROID) 100 MCG tablet Take 100 mcg by mouth daily.      . metoprolol succinate (TOPROL-XL) 50 MG 24 hr tablet TAKE 1 TABLET ONCE DAILY. 30 tablet 3  . propranolol (INDERAL) 10 MG tablet Take 1 tablet (10 mg total) by mouth 4 (four) times daily as needed. 50 tablet 3  . zolpidem (AMBIEN CR) 12.5 MG CR tablet Take 12.5 mg by mouth at bedtime.       No current facility-administered medications for this visit.    Allergies:   Compazine; Cymbalta; Dilaudid; Lorazepam; Metoclopramide hcl; Morphine and related; Other; Promethazine hcl; Tegaderm ag mesh; and Zofran    Social History:  The patient  reports that she has never smoked. She does not have any smokeless tobacco history on file. She reports that she drinks alcohol. She reports that she does not use illicit drugs.   Family History:  The patient'sFamily history is unknown by patient.    ROS:  Please see the history of present illness.   Otherwise, review of systems are positive for none.   All other systems are reviewed and negative.    PHYSICAL EXAM: BP 120/78 mmHg  Pulse 107  Ht _0  (1.651 m)  Wt 145 lb 12.8 oz (66.134 kg)  BMI 24.26 kg/m2 see extended vitals for orthostatic blood pressures GEN: Well  nourished, well developed, in no acute distress HEENT: normal Neck: no JVD, HJR, carotid bruits, or masses Cardiac:RRR; 2/6 systolic murmur at the left sternal border and apex, no gallop, rubs, thrill or heave, patient has edema in her legs and hands and wrists  Respiratory:  clear to auscultation bilaterally, normal work of breathing GI: soft, nontender, nondistended, + BS MS: no deformity or atrophy Extremities: Positive edema in her hands and legs and wrists without cyanosis, clubbing, good distal pulses bilaterally.  Skin: warm and dry, no rash Neuro:  Strength and sensation are intact Psych: euthymic mood, full affect   EKG:  EKG is ordered today. The ekg ordered today demonstrates sinus tachycardia at 107 bpm Recent Labs: 11/23/2013: ALT 26; BUN 13; Creatinine 0.85; Potassium 5.1; Sodium 140    Lipid Panel    Component Value Date/Time   CHOL * 03/18/2010 0540    249        ATP III CLASSIFICATION:  <200     mg/dL   Desirable  200-239  mg/dL   Borderline High  >=240    mg/dL   High          TRIG 96 12/08/2013 0823   TRIG 93 11/23/2013 0857   HDL 62 11/23/2013 0857   HDL 62 03/18/2010 0540   CHOLHDL 3.8 11/23/2013 0857   CHOLHDL 4.0 03/18/2010 0540   VLDL 29 03/18/2010 0540   LDLCALC 131* 12/08/2013 0823   LDLCALC 157* 11/23/2013 0857   LDLCALC * 03/18/2010 0540    158        Total Cholesterol/HDL:CHD Risk Coronary Heart Disease Risk Table                     Men   Women  1/2 Average Risk   3.4   3.3  Average Risk       5.0   4.4  2 X Average Risk   9.6   7.1  3 X Average Risk  23.4   11.0        Use the calculated Patient Ratio above and the CHD Risk Table to determine the patient's CHD Risk.        ATP III CLASSIFICATION (LDL):  <100     mg/dL   Optimal  100-129  mg/dL   Near or Above                    Optimal  130-159  mg/dL   Borderline  160-189  mg/dL   High  >190     mg/dL   Very High      Wt Readings from Last 3 Encounters:  03/09/14 140 lb  (63.504 kg)  11/23/13 144 lb (65.318 kg)  08/11/13 141 lb (63.957 kg)      Other studies Reviewed: Additional studies/ records that were  reviewed today include:   Cardiac catheterization 2010 CONCLUSION:  1. Smooth and normal coronary arteries.  2. Essentially normal right-sided heart pressures.  There is a small      pulmonic valve gradient measured, although I do not think that this      is significant.  She certainly has no cardiac etiology for her      chest pain.   2-D echo 08/2013 Study Conclusions  - Left ventricle: The cavity size was normal. Systolic   function was normal. The estimated ejection fraction was   in the range of 55% to 60%. Wall motion was normal; there   were no regional wall motion abnormalities. Left   ventricular diastolic function parameters were normal. - Aortic valve: Trivial regurgitation. - Mitral valve: Trivial regurgitation. Transthoracic echocardiography.  M-mode, complete 2D, spectral Doppler, and color Doppler.  Height:  Height: 165.1cm. Height: 65in.  Weight:  Weight: 62.1kg. Weight: 136.7lb.  Body mass index:  BMI: 22.8kg/m^2.  Body surface area:    BSA: 1.40m2.  Blood pressure:     110/72.  Patient status:  Inpatient.  Palpitations Patient complains of worsening palpitations but held her Toprol for the past 2 days because of low blood pressure. Her blood pressure is actually up a little here in the office lying was 133/85 went up to 144/93 with a heart rate of 120 standing. Recommend take her Toprol now, she can take Inderal as needed for more palpitations. Not sure if this Estratest is causing some of her problems but recommend her stopping it in contacting her gynecologist as well as her endocrinologist concerning her myriad of symptoms. She has a follow-up with Dr. NAcie Fredricksonin about 2 weeks.   Edema Patient has a lot of swelling in her hands feet and legs and wrists. Not sure what this is coming from but possible the Estratest. She is  also on chronic steroids. Will give Lasix 20 mg once daily for 3-4 days to help with this. Checking labs as well. Recommend follow-up with endocrinologist and gynecologist.   Dizziness Patient has been dizzy for a couple days. It is at rest and not with quick movement or standing up. She is not orthostatic. She is tachycardic. Recommend resuming Toprol.     SSumner Boast PA-C  11/16/2014 10:20 AM    [facility] [address] [place]  [id number]: [phone] Fax: [phone]

## 2014-11-16 NOTE — Progress Notes (Signed)
Echo performed. 

## 2014-11-17 ENCOUNTER — Encounter: Payer: Self-pay | Admitting: Cardiovascular Disease

## 2014-11-17 ENCOUNTER — Telehealth: Payer: Self-pay | Admitting: Cardiovascular Disease

## 2014-11-17 ENCOUNTER — Ambulatory Visit (INDEPENDENT_AMBULATORY_CARE_PROVIDER_SITE_OTHER): Payer: PRIVATE HEALTH INSURANCE | Admitting: Cardiovascular Disease

## 2014-11-17 VITALS — BP 100/80 | HR 95 | Ht 65.0 in | Wt 143.2 lb

## 2014-11-17 DIAGNOSIS — E2740 Unspecified adrenocortical insufficiency: Secondary | ICD-10-CM

## 2014-11-17 DIAGNOSIS — R002 Palpitations: Secondary | ICD-10-CM

## 2014-11-17 DIAGNOSIS — R0789 Other chest pain: Secondary | ICD-10-CM

## 2014-11-17 DIAGNOSIS — R42 Dizziness and giddiness: Secondary | ICD-10-CM | POA: Diagnosis not present

## 2014-11-17 NOTE — Progress Notes (Signed)
Cardiology Office Note   Date:  11/17/2014   ID:  [name], DOB 09/05/1971, MRN [id number]  PCP:  PROVIDER NOT IN SYSTEM  Cardiologist:   Zenaya Ulatowski, Wonda Cheng, MD   Chief Complaint  Patient presents with  . Palpitations   Problem List:  1. Palpitations/premature ventricular contractions 2. history of Hodgkin's lymphoma-status post mantle radiation 3. History of breast cancer-status post Reconstruction 4. History of cervical cancer 5. 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 2015 for regrowth of tumor.  6. History of thyroidectomy - hx of thyroid cancer  7. Raynaud's phenomenon 8. Appendectomy 9. History chest pains-normal heart catheterization, normal TEE 10. history of adrenal insufficiency   History of Present Illness:  [name] is done very well since I last saw her about a year ago. She has had some occasional palpitations that are typically controlled with metoprolol. She's not had any episodes of chest pain. She's been able to stop all of her steroids. Her adrenal glands have gradually improved and her now producing cortisol.  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 She is still working at [employer].   Feb 24, 2013:  She has done well since I last saw her . She has had her breast implants replaced ( old ones were leaking). Her costochondritis has resolved. She has not been lifting.   She has run in 6 5K races this year. She is playing tennis. She is still in [employer] school. She is doing her orthopedic rotation.   nov. 11, 2014:  Her HR has been high. She has a head ache frequently. She started Adderall recently.  The adderall has help with her focus but she feels much worse in it. She has been on the Adderall for 2 months and the tachycardia started about 1 week ago.  She was doing cycle classes 3 times a week. She is  exercising regularly and for the past week, her HR is extremely fast. She has gone into menapause and has lots of hot flashes.   Feb. 23, 2015:  She has been having some recent episodes of tachycardia. We increased her metoprolol at the last visit. We performed an echocardiogram which revealed  Left ventricle: The cavity size was normal. Systolic function was normal. The estimated ejection fraction was in the range of 55% to 60%. Wall motion was normal; there were no regional wall motion abnormalities. Left ventricular diastolic function parameters were normal. - Aortic valve: Trivial regurgitation. - Mitral valve: Trivial regurgitation  She tried to stop her Adderall ( did not do well with that). She decreased her dose slightly and she is not having much tachycardia. She has been   She has been found to have some osteoperosis and osteopenia.   March 09, 2014:  [name] is doing ok.. No recent cardiac problems. She has been very active and is feeling great. Playing tennis on a regular basis.     Feb. 17, 2016:   [name] is a 44 y.o. female who presents for for evaluation of palpitations , hypotension. Echo yesterday  Left ventricle: The cavity size was normal. Systolic function was normal. The estimated ejection fraction was in the range of 55% to 60%. Wall motion was normal; there were no regional wall motion abnormalities. - Aortic valve: There was trivial regurgitation. - Mitral valve: Calcified annulus.  Labs were ordered yesterday but she was not  able to get them. Continues to have palpitations today She is off her Estrogen at this point.  She is back on the Toprol - skipped 2 days.  Has been taking the PRN propranolol  Not feeling well and has not been eating or drinking well. We given 1 dose of lasix   Past Medical History  Diagnosis Date  . Hodgkin lymphoma     STATUS POST MANTLE RADIATION  . Breast cancer     STATUS POST BILATERAL MASTECTOMY.  STATUS POST RECONSTRUCTION. SHE HAD SILICONE BREAST IMPLANTS AND THE LEFT IMPLANT IS LEAKING SLIGHTLY  . Thyroid cancer     STATUS POST SURGICAL REMOVAL-CURRENT ON THYROID REPLACEMENT  . Adrenal insufficiency   . Tachycardia   . Palpitations   . Chest pain   . Hypertension   . Raynaud phenomenon   . Appendicitis 12/19/09  . GERD (gastroesophageal reflux disease)   . Aortic stenosis   . Aortic stenosis   . SVT (supraventricular tachycardia)   . Osteoporosis     Past Surgical History  Procedure Laterality Date  . Pituitary surgery    . Cardiac catheterization  05/18/09    NORMAL CATH  . Appendectomy    . Abdominal hysterectomy       Current Outpatient Prescriptions  Medication Sig Dispense Refill  . amphetamine-dextroamphetamine (ADDERALL) 20 MG tablet Take 20 mg by mouth daily.    . clonazePAM (KLONOPIN) 0.5 MG tablet Take 0.5 mg by mouth 2 (two) times daily as needed. For anxiety.    Marland Kitchen dexlansoprazole (DEXILANT) 60 MG capsule Take 60 mg by mouth daily.      . Diclofenac Potassium 50 MG PACK Take 1 application by mouth as needed. For migraines    . furosemide (LASIX) 20 MG tablet Take 1 tablet (20 mg total) by mouth daily. 30 tablet 4  . hydrocortisone (CORTEF) 10 MG tablet Take 10-15 mg by mouth 2 (two) times daily.    Marland Kitchen L-Methylfolate 15 MG TABS Take by mouth daily.    Marland Kitchen levothyroxine (SYNTHROID, LEVOTHROID) 100 MCG tablet Take 100 mcg by mouth daily.      . metoprolol succinate (TOPROL-XL) 50 MG 24 hr tablet TAKE 1 TABLET ONCE DAILY. 30 tablet 3  . propranolol (INDERAL) 10 MG tablet Take 1 tablet (10 mg total) by mouth 4 (four) times daily as needed. 50 tablet 3  . zolpidem (AMBIEN CR) 12.5 MG CR tablet Take 12.5 mg by mouth at bedtime.       No current facility-administered medications for this visit.    Allergies:   Compazine; Cymbalta; Dilaudid; Lorazepam; Metoclopramide hcl; Morphine and related; Na ferric gluc cplx in sucrose; Other; Promethazine hcl; Tegaderm ag  mesh; Zofran; and Escitalopram    Social History:  The patient  reports that she has never smoked. She does not have any smokeless tobacco history on file. She reports that she drinks alcohol. She reports that she does not use illicit drugs.   Family History:  The patient's Family history is unknown by patient.    ROS:  Please see the history of present illness.    Review of Systems: Constitutional:  denies fever, chills, diaphoresis, appetite change and fatigue.  HEENT: denies photophobia, eye pain, redness, hearing loss, ear pain, congestion, sore throat, rhinorrhea, sneezing, neck pain, neck stiffness and tinnitus.  Respiratory: denies SOB, DOE, cough, chest tightness, and wheezing.  Cardiovascular: admits to chest pain, palpitations and leg and hand swelling.  Gastrointestinal: denies nausea, vomiting, abdominal pain, diarrhea, constipation, blood  in stool.  Genitourinary: denies dysuria, urgency, frequency, hematuria, flank pain and difficulty urinating.  Musculoskeletal: denies  myalgias, back pain, joint swelling, arthralgias and gait problem.   Skin: denies pallor, rash and wound.  Neurological: denies dizziness, seizures, syncope, weakness, light-headedness, numbness and headaches.   Hematological: denies adenopathy, easy bruising, personal or family bleeding history.  Psychiatric/ Behavioral: denies suicidal ideation, mood changes, confusion, nervousness, sleep disturbance and agitation.       All other systems are reviewed and negative.    PHYSICAL EXAM: VS:  BP 100/80 mmHg  Pulse 95  Ht _0  (1.651 m)  Wt 143 lb 3.2 oz (64.955 kg)  BMI 23.83 kg/m2 , BMI Body mass index is 23.83 kg/(m^2). GEN: Well nourished, well developed, in no acute distress HEENT: normal Neck: no JVD, carotid bruits, or masses Cardiac: RRR; no murmurs, rubs, or gallops,no edema  Respiratory:  clear to auscultation bilaterally, normal work of breathing GI: soft, nontender, nondistended, +  BS MS: no deformity or atrophy Skin: warm and dry, no rash Neuro:  Strength and sensation are intact Psych: normal   EKG:  EKG is ordered today. The ekg ordered today demonstrates NSR at 95. Normal ECG    Recent Labs: 11/23/2013: ALT 26; BUN 13; Creatinine 0.85; Potassium 5.1; Sodium 140    Lipid Panel    Component Value Date/Time   CHOL * 03/18/2010 0540    249        ATP III CLASSIFICATION:  <200     mg/dL   Desirable  200-239  mg/dL   Borderline High  >=240    mg/dL   High          TRIG 96 12/08/2013 0823   TRIG 93 11/23/2013 0857   HDL 62 11/23/2013 0857   HDL 62 03/18/2010 0540   CHOLHDL 3.8 11/23/2013 0857   CHOLHDL 4.0 03/18/2010 0540   VLDL 29 03/18/2010 0540   LDLCALC 131* 12/08/2013 0823   LDLCALC 157* 11/23/2013 0857   LDLCALC * 03/18/2010 0540    158        Total Cholesterol/HDL:CHD Risk Coronary Heart Disease Risk Table                     Men   Women  1/2 Average Risk   3.4   3.3  Average Risk       5.0   4.4  2 X Average Risk   9.6   7.1  3 X Average Risk  23.4   11.0        Use the calculated Patient Ratio above and the CHD Risk Table to determine the patient's CHD Risk.        ATP III CLASSIFICATION (LDL):  <100     mg/dL   Optimal  100-129  mg/dL   Near or Above                    Optimal  130-159  mg/dL   Borderline  160-189  mg/dL   High  >190     mg/dL   Very High      Wt Readings from Last 3 Encounters:  11/17/14 143 lb 3.2 oz (64.955 kg)  11/16/14 145 lb 12.8 oz (66.134 kg)  03/09/14 140 lb (63.504 kg)      Other studies Reviewed: Additional studies/ records that were reviewed today include: . Review of the above records demonstrates:    ASSESSMENT AND PLAN:  1. Palpitations/premature ventricular contractions -  [name] is having lots of palpitations and PVCs. I suspect this because she's on estrogens and now her metoprolol dose has been changed. I think she'll feel better once the estrogens or out of her system. Is very  common to have palpitations but starting on estrogen supplementation.  her echo shows normal left ventricle systolic function. She has trivial aortic insufficiency.  2. history of Hodgkin's lymphoma-status post mantle radiation 3. History of breast cancer-status post Reconstruction 4. History of cervical cancer 5. 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 2015 for regrowth of tumor.  6. History of thyroidectomy - hx of thyroid cancer  7. Raynaud's phenomenon 8. Appendectomy 9. History chest pains-normal heart catheterization, normal TEE,,  She's having intermittent episodes of chest palpitations and chest pains. These chest pains are noncardiac. 10. history of adrenal insufficiency     Current medicines are reviewed at length with the patient today.  The patient does not have concerns regarding medicines.  The following changes have been made:  no change   Disposition:   FU with me in 1 month    Signed, Ahna Konkle, Wonda Cheng, MD  11/17/2014 4:32 PM    [facility] [address], [place], [place]  [address] Phone: [phone]; Fax: [phone]

## 2014-11-17 NOTE — Telephone Encounter (Signed)
Pt calling to let Dr Acie Fredrickson and nurse know that she is still sob, and is now experiencing palpitations continuously for 30 secs or greater.  Pt states her palpitations are so bad that she has to hit herself in the chest and cough real hard to come out of it.  Pt states that she was seen in the office by Estella Husk yesterday 2/16 and was instructed to continue taking herToprol XL, take Lasix, and have labs drawn that day to check bmet, lipids, and LFTs, echo, and stop hormone replacement med.  Pt went to have her echo done and no showed for her lab appt.  Informed the pt that according to [facility] PA-C, her echo results showed normal LV function and no change in murmurs since last echo.  Pt also states she did not take her Lasix as ordered for fluid retention, for she states her swelling has subsided since yesterday.  Pt demanding Dr Acie Fredrickson see her, for she was told he is in the office today.  Pt states Dr Acie Fredrickson likes to follow her closely when she has these "episodes." Informed the pt that I will inform Dr Acie Fredrickson and follow-up with any recommendations given.  Pt verbalized understanding and agrees with this plan.

## 2014-11-17 NOTE — Patient Instructions (Signed)
Your physician recommends that you continue on your current medications as directed. Please refer to the Current Medication list given to you today.  Your physician recommends that you have lab work:  TODAY  Keep your follow-up appointment with Dr. Acie Fredrickson on Monday March 7

## 2014-11-17 NOTE — Telephone Encounter (Signed)
Pt c/o Shortness Of Breath: STAT if SOB developed within the last 24 hours or pt is noticeably SOB on the phone  1. Are you currently SOB (can you hear that pt is SOB on the phone)? yes 2. How long have you been experiencing SOB? 3days 3. Are you SOB when sitting or when up moving around? Both 4.  Are you currently experiencing any other symptoms? Heart palpatitions  Pt stated she came in office yesterday to see Estella Husk and now she feel more bad. Want to speak to nurse.

## 2014-11-17 NOTE — Telephone Encounter (Signed)
Contacted the pt to let her know that per Dr Acie Fredrickson, he can see her in the office at 3:30 pm for further eval of complaints.  Pt verbalized understanding and gracious for all the assistance provided.

## 2014-11-18 LAB — COMPREHENSIVE METABOLIC PANEL
ALBUMIN: 4.5 g/dL (ref 3.5–5.2)
ALT: 17 U/L (ref 0–35)
AST: 22 U/L (ref 0–37)
Alkaline Phosphatase: 39 U/L (ref 39–117)
BILIRUBIN TOTAL: 0.4 mg/dL (ref 0.2–1.2)
BUN: 20 mg/dL (ref 6–23)
CALCIUM: 9.7 mg/dL (ref 8.4–10.5)
CHLORIDE: 102 meq/L (ref 96–112)
CO2: 30 meq/L (ref 19–32)
CREATININE: 0.89 mg/dL (ref 0.40–1.20)
GFR: 73.35 mL/min (ref >60.00)
Glucose, Bld: 113 mg/dL — ABNORMAL HIGH (ref 70–99)
Potassium: 4.1 mEq/L (ref 3.5–5.1)
Sodium: 137 mEq/L (ref 135–145)
Total Protein: 7.5 g/dL (ref 6.0–8.3)

## 2014-11-18 LAB — CBC
HEMATOCRIT: 38.7 % (ref 36.0–46.0)
Hemoglobin: 13.1 g/dL (ref 12.0–15.0)
MCHC: 34 g/dL (ref 30.0–36.0)
MCV: 84.1 fl (ref 78.0–100.0)
Platelets: 301 10*3/uL (ref 150.0–400.0)
RBC: 4.61 Mil/uL (ref 3.87–5.11)
RDW: 14.2 % (ref 11.5–15.5)
WBC: 7.2 10*3/uL (ref 4.0–10.5)

## 2014-11-23 ENCOUNTER — Telehealth: Payer: Self-pay | Admitting: *Deleted

## 2014-11-23 NOTE — Telephone Encounter (Signed)
-----  Message from Imogene Burn, PA-C sent at 11/17/2014  7:54 AM EST ----- 2Decho shows normal LV function and no change in murmurs since last echo.

## 2014-12-06 ENCOUNTER — Ambulatory Visit: Payer: Self-pay | Admitting: Cardiovascular Disease

## 2014-12-10 ENCOUNTER — Encounter: Payer: Self-pay | Admitting: *Deleted

## 2014-12-15 ENCOUNTER — Ambulatory Visit (INDEPENDENT_AMBULATORY_CARE_PROVIDER_SITE_OTHER): Payer: PRIVATE HEALTH INSURANCE | Admitting: Cardiovascular Disease

## 2014-12-15 ENCOUNTER — Encounter: Payer: Self-pay | Admitting: Cardiovascular Disease

## 2014-12-15 VITALS — BP 125/85 | HR 106 | Ht 65.0 in

## 2014-12-15 DIAGNOSIS — R002 Palpitations: Secondary | ICD-10-CM | POA: Diagnosis not present

## 2014-12-15 DIAGNOSIS — I10 Essential (primary) hypertension: Secondary | ICD-10-CM | POA: Diagnosis not present

## 2014-12-15 MED ORDER — METOPROLOL SUCCINATE ER 25 MG PO TB24: 75.0000 mg | ORAL_TABLET | Freq: Every day | ORAL | Status: DC

## 2014-12-15 NOTE — Patient Instructions (Signed)
Your physician has recommended you make the following change in your medication:  INCREASE Toprol XL to 75 mg once daily  Your physician has recommended that you wear an event monitor. Event monitors are medical devices that record the heart's electrical activity. Doctors most often [place] these monitors to diagnose arrhythmias. Arrhythmias are problems with the speed or rhythm of the heartbeat. The monitor is a small, portable device. You can wear one while you do your normal daily activities. This is usually used to diagnose what is causing palpitations/syncope (passing out).  Your physician recommends that you schedule a follow-up appointment in: 1-2 months with Dr. Acie Fredrickson

## 2014-12-15 NOTE — Progress Notes (Signed)
Cardiology Office Note   Date:  12/15/2014   ID:  CRISTLE JARED, DOB 1971-02-18, MRN [id number]  PCP:  PROVIDER NOT IN SYSTEM  Cardiologist:   Olyn Landstrom, Wonda Cheng, MD   Chief Complaint  Patient presents with  . Follow-up   Problem List:  1. Palpitations/premature ventricular contractions 2. history of Hodgkin's lymphoma-status post mantle radiation 3. History of breast cancer-status post Reconstruction 4. History of cervical cancer 5. 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 2015 for regrowth of tumor.  6. History of thyroidectomy - hx of thyroid cancer  7. Raynaud's phenomenon 8. Appendectomy 9. History chest pains-normal heart catheterization, normal TEE 10. history of adrenal insufficiency   History of Present Illness:  [name] is done very well since I last saw her about a year ago. She has had some occasional palpitations that are typically controlled with metoprolol. She's not had any episodes of chest pain. She's been able to stop all of her steroids. Her adrenal glands have gradually improved and her now producing cortisol.  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 She is still working at [employer].   Feb 24, 2013:  She has done well since I last saw her . She has had her breast implants replaced ( old ones were leaking). Her costochondritis has resolved. She has not been lifting.   She has run in 6 5K races this year. She is playing tennis. She is still in [employer] school. She is doing her orthopedic rotation.   nov. 11, 2014:  Her HR has been high. She has a head ache frequently. She started Adderall recently.  The adderall has help with her focus but she feels much worse in it. She has been on the Adderall for 2 months and the tachycardia started about 1 week ago.  She was doing cycle classes 3 times a week. She is  exercising regularly and for the past week, her HR is extremely fast. She has gone into menapause and has lots of hot flashes.   Feb. 23, 2015:  She has been having some recent episodes of tachycardia. We increased her metoprolol at the last visit. We performed an echocardiogram which revealed  Left ventricle: The cavity size was normal. Systolic function was normal. The estimated ejection fraction was in the range of 55% to 60%. Wall motion was normal; there were no regional wall motion abnormalities. Left ventricular diastolic function parameters were normal. - Aortic valve: Trivial regurgitation. - Mitral valve: Trivial regurgitation  She tried to stop her Adderall ( did not do well with that). She decreased her dose slightly and she is not having much tachycardia. She has been   She has been found to have some osteoperosis and osteopenia.   March 09, 2014:  [name] is doing ok.. No recent cardiac problems. She has been very active and is feeling great. Playing tennis on a regular basis.     Feb. 17, 2016:   [name] is a 44 y.o. female who presents for for evaluation of palpitations , hypotension. Echo yesterday  Left ventricle: The cavity size was normal. Systolic function was normal. The estimated ejection fraction was in the range of 55% to 60%. Wall motion was normal; there were no regional wall motion abnormalities. - Aortic valve: There was trivial regurgitation. - Mitral valve: Calcified annulus.  Labs were ordered yesterday but she was not  able to get them. Continues to have palpitations today She is off her Estrogen at this point.  She is back on the Toprol - skipped 2 days.  Has been taking the PRN propranolol  Not feeling well and has not been eating or drinking well. We given 1 dose of lasix   December 15, 2014:  She has continued to have palpitations  - last 20-30 seconds .  HR has remained fairly high. Has been working out regularly .  These  are going ok.    Past Medical History  Diagnosis Date  . Hodgkin lymphoma     STATUS POST MANTLE RADIATION  . Breast cancer     STATUS POST BILATERAL MASTECTOMY. STATUS POST RECONSTRUCTION. SHE HAD SILICONE BREAST IMPLANTS AND THE LEFT IMPLANT IS LEAKING SLIGHTLY  . Thyroid cancer     STATUS POST SURGICAL REMOVAL-CURRENT ON THYROID REPLACEMENT  . Adrenal insufficiency   . Tachycardia   . Palpitations   . Chest pain   . Hypertension   . Raynaud phenomenon   . Appendicitis 12/19/09  . GERD (gastroesophageal reflux disease)   . Aortic stenosis   . Aortic stenosis   . SVT (supraventricular tachycardia)   . Osteoporosis     Past Surgical History  Procedure Laterality Date  . Pituitary surgery    . Cardiac catheterization  05/18/09    NORMAL CATH  . Appendectomy    . Abdominal hysterectomy       Current Outpatient Prescriptions  Medication Sig Dispense Refill  . amphetamine-dextroamphetamine (ADDERALL) 20 MG tablet Take 20 mg by mouth daily.    . clonazePAM (KLONOPIN) 0.5 MG tablet Take 0.5 mg by mouth 2 (two) times daily as needed. For anxiety.    Marland Kitchen dexlansoprazole (DEXILANT) 60 MG capsule Take 60 mg by mouth daily.      . Diclofenac Potassium 50 MG PACK Take 1 application by mouth as needed. For migraines    . hydrocortisone (CORTEF) 10 MG tablet Take 10-15 mg by mouth 2 (two) times daily.    Marland Kitchen levothyroxine (SYNTHROID, LEVOTHROID) 100 MCG tablet Take 100 mcg by mouth daily.      . metoprolol succinate (TOPROL-XL) 50 MG 24 hr tablet TAKE 1 TABLET ONCE DAILY. 30 tablet 3  . propranolol (INDERAL) 10 MG tablet Take 1 tablet (10 mg total) by mouth 4 (four) times daily as needed. 50 tablet 3  . zolpidem (AMBIEN CR) 12.5 MG CR tablet Take 12.5 mg by mouth at bedtime.       No current facility-administered medications for this visit.    Allergies:   Compazine; Cymbalta; Dilaudid; Lorazepam; Metoclopramide hcl; Morphine and related; Na ferric gluc cplx in sucrose; Other;  Promethazine hcl; Tegaderm ag mesh; Zofran; and Escitalopram    Social History:  The patient  reports that she has never smoked. She does not have any smokeless tobacco history on file. She reports that she drinks alcohol. She reports that she does not use illicit drugs.   Family History:  The patient's Family history is unknown by patient.    ROS:  Please see the history of present illness.    Review of Systems: Constitutional:  denies fever, chills, diaphoresis, appetite change and fatigue.  HEENT: denies photophobia, eye pain, redness, hearing loss, ear pain, congestion, sore throat, rhinorrhea, sneezing, neck pain, neck stiffness and tinnitus.  Respiratory: denies SOB, DOE, cough, chest tightness, and wheezing.  Cardiovascular: admits to chest pain, palpitations and leg and hand swelling.  Gastrointestinal: denies nausea, vomiting,  abdominal pain, diarrhea, constipation, blood in stool.  Genitourinary: denies dysuria, urgency, frequency, hematuria, flank pain and difficulty urinating.  Musculoskeletal: denies  myalgias, back pain, joint swelling, arthralgias and gait problem.   Skin: denies pallor, rash and wound.  Neurological: denies dizziness, seizures, syncope, weakness, light-headedness, numbness and headaches.   Hematological: denies adenopathy, easy bruising, personal or family bleeding history.  Psychiatric/ Behavioral: denies suicidal ideation, mood changes, confusion, nervousness, sleep disturbance and agitation.       All other systems are reviewed and negative.    PHYSICAL EXAM: VS:  BP 125/85 mmHg  Pulse 106  Ht _0  (1.651 m)  Wt  , BMI Body mass index is 0.00 kg/(m^2). GEN: Well nourished, well developed, in no acute distress HEENT: normal Neck: no JVD, carotid bruits, or masses Cardiac: RRR; no murmurs, rubs, or gallops,no edema  Respiratory:  clear to auscultation bilaterally, normal work of breathing GI: soft, nontender, nondistended, + BS MS: no  deformity or atrophy Skin: warm and dry, no rash Neuro:  Strength and sensation are intact Psych: normal   EKG:  EKG is ordered today. The ekg ordered today demonstrates NSR at 95. Normal ECG    Recent Labs: 11/17/2014: ALT 17; BUN 20; Creatinine 0.89; Hemoglobin 13.1; Platelets 301.0; Potassium 4.1; Sodium 137    Lipid Panel    Component Value Date/Time   CHOL 207* 12/08/2013 0823   CHOL 238* 11/23/2013 0857   CHOL * 03/18/2010 0540    249        ATP III CLASSIFICATION:  <200     mg/dL   Desirable  200-239  mg/dL   Borderline High  >=240    mg/dL   High          TRIG 96 12/08/2013 0823   TRIG 93 11/23/2013 0857   HDL 57 12/08/2013 0823   HDL 62 11/23/2013 0857   HDL 62 03/18/2010 0540   CHOLHDL 3.8 11/23/2013 0857   CHOLHDL 4.0 03/18/2010 0540   VLDL 29 03/18/2010 0540   LDLCALC 131* 12/08/2013 0823   LDLCALC 157* 11/23/2013 0857   LDLCALC * 03/18/2010 0540    158        Total Cholesterol/HDL:CHD Risk Coronary Heart Disease Risk Table                     Men   Women  1/2 Average Risk   3.4   3.3  Average Risk       5.0   4.4  2 X Average Risk   9.6   7.1  3 X Average Risk  23.4   11.0        Use the calculated Patient Ratio above and the CHD Risk Table to determine the patient's CHD Risk.        ATP III CLASSIFICATION (LDL):  <100     mg/dL   Optimal  100-129  mg/dL   Near or Above                    Optimal  130-159  mg/dL   Borderline  160-189  mg/dL   High  >190     mg/dL   Very High      Wt Readings from Last 3 Encounters:  11/17/14 143 lb 3.2 oz (64.955 kg)  11/16/14 145 lb 12.8 oz (66.134 kg)  03/09/14 140 lb (63.504 kg)      Other studies Reviewed: Additional studies/ records that were reviewed today include: .  Review of the above records demonstrates:    ASSESSMENT AND PLAN:  1. Palpitations/premature ventricular contractions - [name] is having lots of palpitations and PVCs. I suspect this because she's on estrogens and now her  metoprolol dose has been changed. I think she'll feel better once the estrogens or out of her system. Is very common to have palpitations but starting on estrogen supplementation.  her echo shows normal left ventricle systolic function. She has trivial aortic insufficiency.  2. history of Hodgkin's lymphoma-status post mantle radiation 3. History of breast cancer-status post Reconstruction 4. History of cervical cancer 5. 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 2015 for regrowth of tumor.  6. History of thyroidectomy - hx of thyroid cancer  7. Raynaud's phenomenon 8. Appendectomy 9. History chest pains-normal heart catheterization, normal TEE,,  She's having intermittent episodes of chest palpitations and chest pains. These chest pains are noncardiac. 10. history of adrenal insufficiency     Current medicines are reviewed at length with the patient today.  The patient does not have concerns regarding medicines.  The following changes have been made:  no change   Disposition:   FU with me in 1 month    Signed, Nomie Buchberger, Wonda Cheng, MD  12/15/2014 4:19 PM    [facility] [address], [place], [place]  [address] Phone: [phone]; Fax: [phone]

## 2014-12-16 ENCOUNTER — Encounter (INDEPENDENT_AMBULATORY_CARE_PROVIDER_SITE_OTHER): Payer: PRIVATE HEALTH INSURANCE

## 2014-12-16 ENCOUNTER — Encounter: Payer: Self-pay | Admitting: *Deleted

## 2014-12-16 DIAGNOSIS — R002 Palpitations: Secondary | ICD-10-CM | POA: Diagnosis not present

## 2014-12-16 NOTE — Progress Notes (Signed)
Patient ID: [name], female   DOB: [birth year], 44 y.o.   MRN: [id number] [employer] 30 day cardiac event monitor applied to patient.

## 2015-01-18 ENCOUNTER — Telehealth: Payer: Self-pay | Admitting: Nurse Practitioner

## 2015-01-18 NOTE — Telephone Encounter (Signed)
Left message for patient to call office for monitor results.  Per Dr. Acie Fredrickson, monitor shows NSR with rare PAC

## 2015-01-18 NOTE — Telephone Encounter (Signed)
Received call from patient  And reported monitor results.  Patient states she had difficulty with the 30 day monitor and that the significant events she describes as "feeling like she has been kicked in the chest" were not recorded.  Patient states she was in touch with the monitor company and was given conflicting advice regarding the battery - states these episodes mostly occur in the morning and they weren't recorded because she had been advised to change the battery every morning and did not realize it took 1 hour for the battery to recharge and begin recording.  Patient c/o episodes that last 2-4 minutes and are not relieved by vasovagal maneuvers.  Patient states she discussed with Dr. Acie Fredrickson that the only thing that relieves the chest discomfort associated with these episodes is to hit herself hard in the chest, which he advised her not to do.  Patient states at times she has felt like she was going to pass out but has not passed out.  Patient states she has taken Propranolol following these episodes to try to prevent future occurrences and this helps some.  She states it seems the episodes have occurred more frequently since Dr. Acie Fredrickson increased the Toprol to 75 mg daily.  I asked if patient thought a 48 hour monitor would be effective to capture events.  She states Dr. 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  I advised patient I will send message to Dr. Acie Fredrickson for advice.  Patient verbalized understanding and agreement and states she is willing to do whatever he suggests - states she is currently scheduled for f/u on 5/5.

## 2015-01-19 NOTE — Telephone Encounter (Signed)
Spoke with patient and reviewed Dr. Elmarie Shiley advice.  Patient states the higher dose of Toprol has not helped her symptoms at all so she will decrease to 50 mg daily.  I advised patient to call back with questions or concerns before 5/5 appointment.  Patient verbalized understanding and agreement.

## 2015-01-19 NOTE — Telephone Encounter (Signed)
The monitor revealed only NSR and rare PACs.  Since she thinks the palpitations are worse on the higher dose of toprol it is ok for her to reduce the dose back to 50 a day.  We can discuss other options  - 48 hr holter vs. Implantable loop at her next office visit.

## 2015-01-28 ENCOUNTER — Ambulatory Visit: Payer: Self-pay | Admitting: Cardiovascular Disease

## 2015-02-02 NOTE — Progress Notes (Signed)
Cardiology Office Note   Date:  02/02/2015   ID:  [name], DOB [birth year], MRN [id number]  PCP:  PROVIDER NOT IN SYSTEM  Cardiologist:   Nahser, Wonda Cheng, MD   Chief Complaint  Patient presents with  . Follow-up    chest pain, palpitations   Problem List:  1. Palpitations/premature ventricular contractions 2. history of Hodgkin's lymphoma-status post mantle radiation 3. History of breast cancer-status post Reconstruction 4. History of cervical cancer 5. 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 2015 for regrowth of tumor.  6. History of thyroidectomy - hx of thyroid cancer  7. Raynaud's phenomenon 8. Appendectomy 9. History chest pains-normal heart catheterization, normal TEE 10. history of adrenal insufficiency   History of Present Illness:  [name] is done very well since I last saw her about a year ago. She has had some occasional palpitations that are typically controlled with metoprolol. She's not had any episodes of chest pain. She's been able to stop all of her steroids. Her adrenal glands have gradually improved and her now producing cortisol.  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 She is still working at [employer].   Feb 24, 2013:  She has done well since I last saw her . She has had her breast implants replaced ( old ones were leaking). Her costochondritis has resolved. She has not been lifting.   She has run in 6 5K races this year. She is playing tennis. She is still in [employer] school. She is doing her orthopedic rotation.   nov. 11, 2014:  Her HR has been high. She has a head ache frequently. She started Adderall recently.  The adderall has help with her focus but she feels much worse in it. She has been on the Adderall for 2 months and the tachycardia started about 1 week ago.  She was doing cycle classes 3  times a week. She is exercising regularly and for the past week, her HR is extremely fast. She has gone into menapause and has lots of hot flashes.   Feb. 23, 2015:  She has been having some recent episodes of tachycardia. We increased her metoprolol at the last visit. We performed an echocardiogram which revealed  Left ventricle: The cavity size was normal. Systolic function was normal. The estimated ejection fraction was in the range of 55% to 60%. Wall motion was normal; there were no regional wall motion abnormalities. Left ventricular diastolic function parameters were normal. - Aortic valve: Trivial regurgitation. - Mitral valve: Trivial regurgitation  She tried to stop her Adderall ( did not do well with that). She decreased her dose slightly and she is not having much tachycardia. She has been   She has been found to have some osteoperosis and osteopenia.   March 09, 2014:  [name] is doing ok.. No recent cardiac problems. She has been very active and is feeling great. Playing tennis on a regular basis.     Feb. 17, 2016:   [name] is a 44 y.o. female who presents for for evaluation of palpitations , hypotension. Echo yesterday  Left ventricle: The cavity size was normal. Systolic function was normal. The estimated ejection fraction was in the range of 55% to 60%. Wall motion was normal; there were no regional wall motion abnormalities. - Aortic valve: There was trivial regurgitation. - Mitral valve: Calcified annulus.  Labs were  ordered yesterday but she was not able to get them. Continues to have palpitations today She is off her Estrogen at this point.  She is back on the Toprol - skipped 2 days.  Has been taking the PRN propranolol  Not feeling well and has not been eating or drinking well. We given 1 dose of lasix   December 15, 2014:  She has continued to have palpitations  - last 20-30 seconds .  HR has remained fairly high. Has been working  out regularly .  These are going ok.    Feb 03, 2015: [name] continues to have palpitations . She wore a monitor but actually only wore is for about 1/2 of the time ( she was at [employer]) We have discussed implantable loop.  I have some concerns about doing any procedures on her .  She has only been taking the toprol 50 mg a day instead of 75.   Past Medical History  Diagnosis Date  . Hodgkin lymphoma     STATUS POST MANTLE RADIATION  . Breast cancer     STATUS POST BILATERAL MASTECTOMY. STATUS POST RECONSTRUCTION. SHE HAD SILICONE BREAST IMPLANTS AND THE LEFT IMPLANT IS LEAKING SLIGHTLY  . Thyroid cancer     STATUS POST SURGICAL REMOVAL-CURRENT ON THYROID REPLACEMENT  . Adrenal insufficiency   . Tachycardia   . Palpitations   . Chest pain   . Hypertension   . Raynaud phenomenon   . Appendicitis 12/19/09  . GERD (gastroesophageal reflux disease)   . Aortic stenosis   . Aortic stenosis   . SVT (supraventricular tachycardia)   . Osteoporosis     Past Surgical History  Procedure Laterality Date  . Pituitary surgery    . Cardiac catheterization  05/18/09    NORMAL CATH  . Appendectomy    . Abdominal hysterectomy       Current Outpatient Prescriptions  Medication Sig Dispense Refill  . amphetamine-dextroamphetamine (ADDERALL) 20 MG tablet Take 20 mg by mouth daily.    . clonazePAM (KLONOPIN) 0.5 MG tablet Take 0.5 mg by mouth 2 (two) times daily as needed. For anxiety.    Marland Kitchen dexlansoprazole (DEXILANT) 60 MG capsule Take 60 mg by mouth daily.      . Diclofenac Potassium 50 MG PACK Take 1 application by mouth as needed. For migraines    . hydrocortisone (CORTEF) 10 MG tablet Take 10-15 mg by mouth 2 (two) times daily.    Marland Kitchen levothyroxine (SYNTHROID, LEVOTHROID) 100 MCG tablet Take 100 mcg by mouth daily.      . metoprolol succinate (TOPROL-XL) 25 MG 24 hr tablet Take 3 tablets (75 mg total) by mouth daily. Take with or immediately following a meal. 270 tablet 3  . propranolol  (INDERAL) 10 MG tablet Take 1 tablet (10 mg total) by mouth 4 (four) times daily as needed. 50 tablet 3  . zolpidem (AMBIEN CR) 12.5 MG CR tablet Take 12.5 mg by mouth at bedtime.       No current facility-administered medications for this visit.    Allergies:   Compazine; Cymbalta; Dilaudid; Lorazepam; Metoclopramide hcl; Morphine and related; Na ferric gluc cplx in sucrose; Other; Promethazine hcl; Tegaderm ag mesh; Zofran; and Escitalopram    Social History:  The patient  reports that she has never smoked. She does not have any smokeless tobacco history on file. She reports that she drinks alcohol. She reports that she does not use illicit drugs.   Family History:  The patient's Family history  is unknown by patient.    ROS:  Please see the history of present illness.    Review of Systems: Constitutional:  denies fever, chills, diaphoresis, appetite change and fatigue.  HEENT: denies photophobia, eye pain, redness, hearing loss, ear pain, congestion, sore throat, rhinorrhea, sneezing, neck pain, neck stiffness and tinnitus.  Respiratory: denies SOB, DOE, cough, chest tightness, and wheezing.  Cardiovascular: admits to chest pain, palpitations and leg and hand swelling.  Gastrointestinal: denies nausea, vomiting, abdominal pain, diarrhea, constipation, blood in stool.  Genitourinary: denies dysuria, urgency, frequency, hematuria, flank pain and difficulty urinating.  Musculoskeletal: denies  myalgias, back pain, joint swelling, arthralgias and gait problem.   Skin: denies pallor, rash and wound.  Neurological: denies dizziness, seizures, syncope, weakness, light-headedness, numbness and headaches.   Hematological: denies adenopathy, easy bruising, personal or family bleeding history.  Psychiatric/ Behavioral: denies suicidal ideation, mood changes, confusion, nervousness, sleep disturbance and agitation.       All other systems are reviewed and negative.    PHYSICAL EXAM: VS:   There were no vitals taken for this visit. , BMI There is no weight on file to calculate BMI. GEN: Well nourished, well developed, in no acute distress HEENT: normal Neck: no JVD, carotid bruits, or masses Cardiac: RRR; no murmurs, rubs, or gallops,no edema  Respiratory:  clear to auscultation bilaterally, normal work of breathing GI: soft, nontender, nondistended, + BS MS: no deformity or atrophy Skin: warm and dry, no rash Neuro:  Strength and sensation are intact Psych: normal   EKG:  EKG is ordered today. The ekg ordered today demonstrates NSR at 95. Normal ECG    Recent Labs: 11/17/2014: ALT 17; BUN 20; Creatinine 0.89; Hemoglobin 13.1; Platelets 301.0; Potassium 4.1; Sodium 137    Lipid Panel    Component Value Date/Time   CHOL 207* 12/08/2013 0823   CHOL 238* 11/23/2013 0857   CHOL * 03/18/2010 0540    249        ATP III CLASSIFICATION:  <200     mg/dL   Desirable  200-239  mg/dL   Borderline High  >=240    mg/dL   High          TRIG 96 12/08/2013 0823   TRIG 93 11/23/2013 0857   HDL 57 12/08/2013 0823   HDL 62 11/23/2013 0857   HDL 62 03/18/2010 0540   CHOLHDL 3.8 11/23/2013 0857   CHOLHDL 4.0 03/18/2010 0540   VLDL 29 03/18/2010 0540   LDLCALC 131* 12/08/2013 0823   LDLCALC 157* 11/23/2013 0857   LDLCALC * 03/18/2010 0540    158        Total Cholesterol/HDL:CHD Risk Coronary Heart Disease Risk Table                     Men   Women  1/2 Average Risk   3.4   3.3  Average Risk       5.0   4.4  2 X Average Risk   9.6   7.1  3 X Average Risk  23.4   11.0        Use the calculated Patient Ratio above and the CHD Risk Table to determine the patient's CHD Risk.        ATP III CLASSIFICATION (LDL):  <100     mg/dL   Optimal  100-129  mg/dL   Near or Above  Optimal  130-159  mg/dL   Borderline  160-189  mg/dL   High  >190     mg/dL   Very High      Wt Readings from Last 3 Encounters:  11/17/14 143 lb 3.2 oz (64.955 kg)  11/16/14  145 lb 12.8 oz (66.134 kg)  03/09/14 140 lb (63.504 kg)      Other studies Reviewed: Additional studies/ records that were reviewed today include: . Review of the above records demonstrates:    ASSESSMENT AND PLAN:  1. Palpitations/premature ventricular contractions - [name] is still having lots of palpitations that sound like paroxysmal supraventricular tachycardia. We discussed increasing her Toprol-XL back up to 75 mg a day which is her prescribed dose. We also see discussed perhaps taking a propranolol tablets on days before she goes to work out to help keep her heart rate slowed.  I would like to repeat the Event Monitor but she does not want to do that at this time.   She was not able to wear the monitor for very long .   She has lots of other issues going on-or her son adn will call us when she wants to wear the monitor.   2. history of Hodgkin's lymphoma-status post mantle radiation 3. History of breast cancer-status post Reconstruction 4. History of cervical cancer 5. 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 2015 for regrowth of tumor.  6. History of thyroidectomy - hx of thyroid cancer  7. Raynaud's phenomenon 8. Appendectomy 9. History chest pains-normal heart catheterization, normal TEE,,  She's having intermittent episodes of chest palpitations and chest pains. These chest pains are noncardiac. 10. history of adrenal insufficiency  11.  Palpitations:  [name] continues to have some palpitations. We will place a event monitor on her as soon as she would like. She does not want to wear at this point because she's Too much going on. Increase her Toprol-XL to 75 mg a day. She'll try spacing out through the day.  I'll see her again in the office in 2-3 months.    Current medicines are reviewed at length with the patient today.  The patient does not have concerns regarding medicines.  The following changes have been made:  no  change   Disposition:   FU with me in me in 2-3 months.    Signed, Nahser, Wonda Cheng, MD  02/02/2015 9:48 PM    [facility] [place], [place], [place]  [address] Phone: [phone]; Fax: [phone]

## 2015-02-03 ENCOUNTER — Ambulatory Visit: Payer: Self-pay | Admitting: Cardiovascular Disease

## 2015-02-03 ENCOUNTER — Encounter: Payer: Self-pay | Admitting: Cardiovascular Disease

## 2015-02-03 ENCOUNTER — Ambulatory Visit (INDEPENDENT_AMBULATORY_CARE_PROVIDER_SITE_OTHER): Payer: PRIVATE HEALTH INSURANCE | Admitting: Cardiovascular Disease

## 2015-02-03 VITALS — BP 110/86 | HR 95 | Ht 65.0 in | Wt 146.0 lb

## 2015-02-03 DIAGNOSIS — R002 Palpitations: Secondary | ICD-10-CM

## 2015-02-03 NOTE — Patient Instructions (Signed)
Medication Instructions:  Your physician recommends that you continue on your current medications as directed. Please refer to the Current Medication list given to you today.   Labwork: None  Testing/Procedures: None  Follow-Up: Your physician recommends that you schedule a follow-up appointment in: 3 months with Dr. Acie Fredrickson

## 2015-02-07 ENCOUNTER — Telehealth: Payer: Self-pay | Admitting: Cardiovascular Disease

## 2015-02-07 NOTE — Telephone Encounter (Signed)
Patient checked out. She handed me her AVS and said she would call back to schedue her 3 month follow up.

## 2015-02-08 NOTE — Telephone Encounter (Signed)
Noted.  Patient told me before she left office that she would call back to schedule

## 2015-02-15 ENCOUNTER — Telehealth: Payer: Self-pay | Admitting: Cardiovascular Disease

## 2015-02-15 NOTE — Telephone Encounter (Signed)
Pt called because she had Lipids panel done at her PCP Dr. Allen Norris. Hawks. MD  gave  Pt a copy of her lab results. Pt was recommended to call Dr. Acie Fredrickson,  and make him aware of the results, because she will need to be taking Statins medication. Pt's total cholesterol is 265, LDL 187, Triglyceride 83 and HDL 65. Her PCP is with  the [facility] physicians ph # [phone]. Pt was asked to call her PCP to fax to this office her labs results. Pt verbalized understanding.

## 2015-02-15 NOTE — Telephone Encounter (Signed)
New message      PCP did a lipid panel and the results are not good.  She has a copy and want to discuss the results.  She thinks she will need to be put back on lipitor.  Please call

## 2015-02-16 NOTE — Telephone Encounter (Signed)
I spoke with patient and we discussed options of medications and lipid clinic and patient agrees that lipid clinic would likely be best option due to her intolerance of low dose Atorvastatin in the past.  I scheduled her for appointment with our pharmacist tomorrow 5/19.  Patient thanked me for the call.

## 2015-02-16 NOTE — Telephone Encounter (Signed)
Will follow her chol levels.   Agree with trying statin

## 2015-02-17 ENCOUNTER — Ambulatory Visit (INDEPENDENT_AMBULATORY_CARE_PROVIDER_SITE_OTHER): Payer: PRIVATE HEALTH INSURANCE | Admitting: Pharmacist

## 2015-02-17 DIAGNOSIS — E785 Hyperlipidemia, unspecified: Secondary | ICD-10-CM

## 2015-02-17 MED ORDER — ROSUVASTATIN CALCIUM 5 MG PO TABS: 5.0000 mg | ORAL_TABLET | Freq: Every day | ORAL | Status: DC

## 2015-02-17 NOTE — Progress Notes (Signed)
Cardiology Office Note   Date:  02/17/2015   ID:  [name], DOB [birth year], MRN [id number]  PCP:  PROVIDER NOT IN SYSTEM  Cardiologist:   Mertie Moores, MD   Chief Complaint  Patient presents with  . Hyperlipidemia    History of Present Illness:  [name] is a 44 yo pt of Dr. Acie Fredrickson with a complicated PMH that includes Hodgkin's lymphoma as a teenager followed by breast cancer, tyroid cancer, cervical cancer s/p hysterectomy and a pituitary tumor.  The treatment of all of these issues has led to adrenal insufficiency, which requires chronic steroid treatment.  She is also on thyroid replacement therapy after her thyroidectomy.     Her only cardiac issue is palpitations and she has been seeing Dr. Acie Fredrickson for >10 years to help manage this.  At her PCP visit in May, her cholesterol was checked and LDL was 187.  At that time, the need to start statin therapy was discussed.    Pt has tried statin therapy in the past  She states she took Lipitor 57m daily and that it lowered her cholesterol extremely well but caused severe joint pain and so it was stopped.  She has not been on any lipid lowering therapy lately.    She is adopted so no family history available.  She [facility] and may be able to screen her for other metabolic/hyperlipidemia issues.    Pt is very active.  She likes to run 5K.  She is concerned that this may be limited in the future due to osteopenia.  She had a bone density scan this week by her endocrinologist and is awaiting treatment plans.  She has 3 children (2 115yr old twins and a 96yr old son with CF).  She works at [employer]  She is currently taking a break from [employer] due to her other responsibilities.   Labs:  01/2015: TC 265, TG 83, HDL 61, LDL 187, LFTS normal (no therapy) 11/2013: TC 207, TG 96, HDL 57, LDL 131, LDL-P 1384 (no therapy)   Current Outpatient Prescriptions    Medication Sig Dispense Refill  . amphetamine-dextroamphetamine (ADDERALL) 20 MG tablet Take 20 mg by mouth 2 (two) times daily.     . clonazePAM (KLONOPIN) 0.5 MG tablet Take 0.5 mg by mouth 2 (two) times daily as needed. For anxiety.    .Marland Kitchendexlansoprazole (DEXILANT) 60 MG capsule Take 60 mg by mouth daily.      . Diclofenac Potassium 50 MG PACK Take 1 application by mouth as needed. For migraines    . hydrocortisone (CORTEF) 10 MG tablet Take 10-15 mg by mouth 2 (two) times daily.    .Marland Kitchenlevothyroxine (SYNTHROID, LEVOTHROID) 100 MCG tablet Take 100 mcg by mouth daily.      . metoprolol succinate (TOPROL-XL) 25 MG 24 hr tablet Take 3 tablets (75 mg total) by mouth daily. Take with or immediately following a meal. 270 tablet 3  . propranolol (INDERAL) 10 MG tablet Take 1 tablet (10 mg total) by mouth 4 (four) times daily as needed. 50 tablet 3  . rosuvastatin (CRESTOR) 5 MG tablet Take 1-2 tablets (5-10 mg total) by mouth daily. 60 tablet 3  . zolpidem (AMBIEN CR) 12.5 MG CR tablet Take 12.5 mg by mouth at bedtime.       No current facility-administered medications for this visit.    Allergies:  Tape; Buprenorphine hcl; Compazine; Cymbalta; Dilaudid; Lorazepam; Metoclopramide hcl; Morphine and related; Na ferric gluc cplx in sucrose; Other; Promethazine hcl; Tegaderm ag mesh; Zofran; Escitalopram; Lorazepam; and Ondansetron    ASSESSMENT AND PLAN: 1.  Hyperlipidemia-  Reviewed pt's history.  Given her use of long term steroid use as well as her other PMH, pt would like to be fairly aggressive with treating her cholesterol.  Will aim for an LDL goal of <100 mg/dL.  Pt has only failed Lipitor.  Discussed other options.  Will start Crestor 87m daily and gave pt instructions for titrating up and/or down depending on if she develops any symptoms of muscle aches.  She will recheck labs in 2 months prior to her visit with Dr. NAcie Fredrickson   SCyndee BrightlyRAscension [facility] 02/17/2015 3:46 PM    [facility] [address] [place]  [id number]: [phone] Fax: [phone]

## 2015-02-17 NOTE — Patient Instructions (Signed)
Start Crestor 1m daily.  If you have any problems, please call SGay Fillerat [phone]   We will check your labs in August before you see Dr. NAcie Fredrickson

## 2015-05-16 ENCOUNTER — Other Ambulatory Visit (INDEPENDENT_AMBULATORY_CARE_PROVIDER_SITE_OTHER): Payer: PRIVATE HEALTH INSURANCE | Admitting: *Deleted

## 2015-05-16 DIAGNOSIS — E785 Hyperlipidemia, unspecified: Secondary | ICD-10-CM

## 2015-05-16 LAB — HEPATIC FUNCTION PANEL
ALT: 26 U/L (ref 0–35)
AST: 25 U/L (ref 0–37)
Albumin: 4.2 g/dL (ref 3.5–5.2)
Alkaline Phosphatase: 44 U/L (ref 39–117)
BILIRUBIN DIRECT: 0.1 mg/dL (ref 0.0–0.3)
BILIRUBIN TOTAL: 0.4 mg/dL (ref 0.2–1.2)
Total Protein: 7 g/dL (ref 6.0–8.3)

## 2015-05-16 LAB — LIPID PANEL
CHOLESTEROL: 184 mg/dL (ref 0–200)
HDL: 63 mg/dL (ref >39.00)
LDL Cholesterol: 100 mg/dL — ABNORMAL HIGH (ref 0–99)
NonHDL: 120.9
Total CHOL/HDL Ratio: 3
Triglycerides: 107 mg/dL (ref 0.0–149.0)
VLDL: 21.4 mg/dL (ref 0.0–40.0)

## 2015-05-17 ENCOUNTER — Encounter: Payer: Self-pay | Admitting: Cardiovascular Disease

## 2015-05-17 ENCOUNTER — Ambulatory Visit (INDEPENDENT_AMBULATORY_CARE_PROVIDER_SITE_OTHER): Payer: PRIVATE HEALTH INSURANCE | Admitting: Cardiovascular Disease

## 2015-05-17 VITALS — BP 100/62 | HR 87 | Ht 65.0 in | Wt 140.0 lb

## 2015-05-17 DIAGNOSIS — R002 Palpitations: Secondary | ICD-10-CM

## 2015-05-17 NOTE — Patient Instructions (Signed)
Medication Instructions:  Your physician recommends that you continue on your current medications as directed. Please refer to the Current Medication list given to you today.   Labwork: none  Testing/Procedures: none  Follow-Up: Your physician wants you to follow-up in: six months with Dr. Acie Fredrickson in [place]. You will receive a reminder letter in the mail two months in advance. If you don't receive a letter, please call our office to schedule the follow-up appointment.   Any Other Special Instructions Will Be Listed Below (If Applicable).

## 2015-05-17 NOTE — Progress Notes (Signed)
Cardiology Office Note   Date:  05/17/2015   ID:  [name], DOB [birth year], MRN [id number]  PCP:  PROVIDER NOT IN SYSTEM  Cardiologist:   Burt Piatek, Wonda Cheng, MD   Chief Complaint  Patient presents with  . other    3 month follow up. Meds reviewed by the patient verbally. "doing well." Still having the palpitations.    Problem List:  1. Palpitations/premature ventricular contractions 2. history of Hodgkin's lymphoma-status post mantle radiation 3. History of breast cancer-status post Reconstruction 4. History of cervical cancer 5. 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 2015 for regrowth of tumor.  6. History of thyroidectomy - hx of thyroid cancer  7. Raynaud's phenomenon 8. Appendectomy 9. History chest pains-normal heart catheterization, normal TEE 10. history of adrenal insufficiency  11. Hyperlipidemia   History of Present Illness:  [name] is done very well since I last saw her about a year ago. She has had some occasional palpitations that are typically controlled with metoprolol. She's not had any episodes of chest pain. She's been able to stop all of her steroids. Her adrenal glands have gradually improved and her now producing cortisol.  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 She is still working at [employer].   Feb 24, 2013:  She has done well since I last saw her . She has had her breast implants replaced ( old ones were leaking). Her costochondritis has resolved. She has not been lifting.   She has run in 6 5K races this year. She is playing tennis. She is still in [employer] school. She is doing her orthopedic rotation.   nov. 11, 2014:  Her HR has been high. She has a head ache frequently. She started Adderall recently.  The adderall has help with her focus but she feels much worse in it. She has been on the  Adderall for 2 months and the tachycardia started about 1 week ago.  She was doing cycle classes 3 times a week. She is exercising regularly and for the past week, her HR is extremely fast. She has gone into menapause and has lots of hot flashes.   Feb. 23, 2015:  She has been having some recent episodes of tachycardia. We increased her metoprolol at the last visit. We performed an echocardiogram which revealed  Left ventricle: The cavity size was normal. Systolic function was normal. The estimated ejection fraction was in the range of 55% to 60%. Wall motion was normal; there were no regional wall motion abnormalities. Left ventricular diastolic function parameters were normal. - Aortic valve: Trivial regurgitation. - Mitral valve: Trivial regurgitation  She tried to stop her Adderall ( did not do well with that). She decreased her dose slightly and she is not having much tachycardia. She has been   She has been found to have some osteoperosis and osteopenia.   March 09, 2014:  [name] is doing ok.. No recent cardiac problems. She has been very active and is feeling great. Playing tennis on a regular basis.     Feb. 17, 2016:   [name] is a 44 y.o. female who presents for for evaluation of palpitations , hypotension. Echo yesterday  Left ventricle: The cavity size was normal. Systolic function was normal. The estimated ejection fraction was in the range of 55% to 60%. Wall motion was normal; there were no regional wall  motion abnormalities. - Aortic valve: There was trivial regurgitation. - Mitral valve: Calcified annulus.  Labs were ordered yesterday but she was not able to get them. Continues to have palpitations today She is off her Estrogen at this point.  She is back on the Toprol - skipped 2 days.  Has been taking the PRN propranolol  Not feeling well and has not been eating or drinking well. We given 1 dose of lasix   December 15, 2014:  She has  continued to have palpitations  - last 20-30 seconds .  HR has remained fairly high. Has been working out regularly .  These are going ok.    Feb 03, 2015: [name] continues to have palpitations . She wore a monitor but actually only wore is for about 1/2 of the time ( she was at [employer]) We have discussed implantable loop.  I have some concerns about doing any procedures on her .  She has only been taking the toprol 50 mg a day instead of 75.   May 17, 2015:  [name] is seen today for follow up. She has a history of hyperlipidemia and we sent her to lipid clinic. She is tolerating the Crestor very well.   Still having palpitations but learning how to manage .   Past Medical History  Diagnosis Date  . Hodgkin lymphoma     STATUS POST MANTLE RADIATION  . Breast cancer     STATUS POST BILATERAL MASTECTOMY. STATUS POST RECONSTRUCTION. SHE HAD SILICONE BREAST IMPLANTS AND THE LEFT IMPLANT IS LEAKING SLIGHTLY  . Thyroid cancer     STATUS POST SURGICAL REMOVAL-CURRENT ON THYROID REPLACEMENT  . Adrenal insufficiency   . Tachycardia   . Palpitations   . Chest pain   . Hypertension   . Raynaud phenomenon   . Appendicitis 12/19/09  . GERD (gastroesophageal reflux disease)   . Aortic stenosis   . Aortic stenosis   . SVT (supraventricular tachycardia)   . Osteoporosis     Past Surgical History  Procedure Laterality Date  . Pituitary surgery    . Cardiac catheterization  05/18/09    NORMAL CATH  . Appendectomy    . Abdominal hysterectomy       Current Outpatient Prescriptions  Medication Sig Dispense Refill  . amphetamine-dextroamphetamine (ADDERALL) 20 MG tablet Take 20 mg by mouth 2 (two) times daily.     . clonazePAM (KLONOPIN) 0.5 MG tablet Take 0.5 mg by mouth 2 (two) times daily as needed. For anxiety.    Marland Kitchen dexlansoprazole (DEXILANT) 60 MG capsule Take 60 mg by mouth daily.      . Diclofenac Potassium 50 MG PACK Take 1 application by mouth as needed. For migraines    .  hydrocortisone (CORTEF) 10 MG tablet Take 10-15 mg by mouth 2 (two) times daily.    Marland Kitchen levothyroxine (SYNTHROID, LEVOTHROID) 100 MCG tablet Take 100 mcg by mouth daily.      . metoprolol succinate (TOPROL-XL) 25 MG 24 hr tablet Take 3 tablets (75 mg total) by mouth daily. Take with or immediately following a meal. 270 tablet 3  . propranolol (INDERAL) 10 MG tablet Take 1 tablet (10 mg total) by mouth 4 (four) times daily as needed. 50 tablet 3  . rosuvastatin (CRESTOR) 5 MG tablet Take 1-2 tablets (5-10 mg total) by mouth daily. 60 tablet 3  . zolpidem (AMBIEN CR) 12.5 MG CR tablet Take 12.5 mg by mouth at bedtime.       No current  facility-administered medications for this visit.    Allergies:   Tape; Buprenorphine hcl; Compazine; Cymbalta; Dilaudid; Lorazepam; Metoclopramide hcl; Morphine and related; Na ferric gluc cplx in sucrose; Other; Promethazine hcl; Tegaderm ag mesh; Zofran; Escitalopram; Lorazepam; and Ondansetron    Social History:  The patient  reports that she has never smoked. She does not have any smokeless tobacco history on file. She reports that she drinks alcohol. She reports that she does not use illicit drugs.   Family History:  The patient's Family history is unknown by patient.    ROS:  Please see the history of present illness.    Review of Systems: Constitutional:  denies fever, chills, diaphoresis, appetite change and fatigue.  HEENT: denies photophobia, eye pain, redness, hearing loss, ear pain, congestion, sore throat, rhinorrhea, sneezing, neck pain, neck stiffness and tinnitus.  Respiratory: denies SOB, DOE, cough, chest tightness, and wheezing.  Cardiovascular: admits to chest pain, palpitations and leg and hand swelling.  Gastrointestinal: denies nausea, vomiting, abdominal pain, diarrhea, constipation, blood in stool.  Genitourinary: denies dysuria, urgency, frequency, hematuria, flank pain and difficulty urinating.  Musculoskeletal: denies  myalgias, back  pain, joint swelling, arthralgias and gait problem.   Skin: denies pallor, rash and wound.  Neurological: denies dizziness, seizures, syncope, weakness, light-headedness, numbness and headaches.   Hematological: denies adenopathy, easy bruising, personal or family bleeding history.  Psychiatric/ Behavioral: denies suicidal ideation, mood changes, confusion, nervousness, sleep disturbance and agitation.       All other systems are reviewed and negative.    PHYSICAL EXAM: VS:  BP 100/62 mmHg  Pulse 87  Ht _0  (1.651 m)  Wt 63.504 kg (140 lb)  BMI 23.30 kg/m2 , BMI Body mass index is 23.3 kg/(m^2). GEN: Well nourished, well developed, in no acute distress HEENT: normal Neck: no JVD, carotid bruits, or masses Cardiac: RRR; no murmurs, rubs, or gallops,no edema  Respiratory:  clear to auscultation bilaterally, normal work of breathing GI: soft, nontender, nondistended, + BS MS: no deformity or atrophy Skin: warm and dry, no rash Neuro:  Strength and sensation are intact Psych: normal   EKG:  EKG is ordered today. The ekg ordered today demonstrates NSR at 95. Normal ECG    Recent Labs: 11/17/2014: BUN 20; Creatinine, Ser 0.89; Hemoglobin 13.1; Platelets 301.0; Potassium 4.1; Sodium 137 05/16/2015: ALT 26    Lipid Panel    Component Value Date/Time   CHOL 184 05/16/2015 0933   CHOL 207* 12/08/2013 0823   CHOL 238* 11/23/2013 0857   TRIG 107.0 05/16/2015 0933   TRIG 96 12/08/2013 0823   HDL 63.00 05/16/2015 0933   HDL 57 12/08/2013 0823   HDL 62 11/23/2013 0857   CHOLHDL 3 05/16/2015 0933   CHOLHDL 3.8 11/23/2013 0857   VLDL 21.4 05/16/2015 0933   LDLCALC 100* 05/16/2015 0933   LDLCALC 131* 12/08/2013 0823   LDLCALC 157* 11/23/2013 0857      Wt Readings from Last 3 Encounters:  05/17/15 63.504 kg (140 lb)  02/03/15 66.225 kg (146 lb)  11/17/14 64.955 kg (143 lb 3.2 oz)      Other studies Reviewed: Additional studies/ records that were reviewed today  include: . Review of the above records demonstrates:    ASSESSMENT AND PLAN:  1. Palpitations/premature ventricular contractions - [name] is still having lots of palpitations that sound like paroxysmal supraventricular tachycardia. We discussed increasing her Toprol-XL back up to 75 mg a day which is her prescribed dose. We also see discussed perhaps taking a  propranolol tablets on days before she goes to work out to help keep her heart rate slowed.  I would like to repeat the Event Monitor but she does not want to do that at this time.   She was not able to wear the monitor for very long .   She has lots of other issues going on-or her son adn will call us when she wants to wear the monitor.   2. history of Hodgkin's lymphoma-status post mantle radiation 3. History of breast cancer-status post Reconstruction 4. History of cervical cancer 5. 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 2015 for regrowth of tumor.  6. History of thyroidectomy - hx of thyroid cancer  7. Raynaud's phenomenon 8. Appendectomy 9. History chest pains-normal heart catheterization, normal TEE,,  She's having intermittent episodes of chest palpitations and chest pains. These chest pains are noncardiac. 10. history of adrenal insufficiency  11.  Palpitations:  [name] continues to have some palpitations. She did not do well with the 30 day event monitor - battery issues ( needed to be changed every am and that is typically when she has they arrhythmia)  I have reassured her that her arrhythmias are benign. I was able to palpate her wrist when she was having one of these episodes and I noticed a slight increase in her heart rate but no significant arrhythmias.  We discussed placing an implantable loop recorder but I do not think that this is wise at this point. She has bilateral breast implants from her bilateral mastectomies and she is on chronic Cortef .  She may be at some  increased risks for infection. In addition, I'm quite sure that these dysrhythmias are benign at this time and I do not think that we should do any surgical procedure for these palpitations. If she starts having worsening symptoms or has episodes of syncope then we certainly considered candidate consider placing a loop recorder.  I'll see her again in the office in 2-3 months.  Current medicines are reviewed at length with the patient today.  The patient does not have concerns regarding medicines.  The following changes have been made:  no change  Disposition:   FU with me in me in 6  months.    Signed, Loraina Stauffer, Wonda Cheng, MD  05/17/2015 3:23 PM    [facility] [address], [place], [place]  [address] Phone: [phone]; Fax: [phone]

## 2015-05-20 ENCOUNTER — Encounter (HOSPITAL_BASED_OUTPATIENT_CLINIC_OR_DEPARTMENT_OTHER): Payer: Self-pay

## 2015-05-20 ENCOUNTER — Emergency Department (HOSPITAL_BASED_OUTPATIENT_CLINIC_OR_DEPARTMENT_OTHER)
Admission: EM | Admit: 2015-05-20 | Discharge: 2015-05-20 | Disposition: A | Discharge status: Home / Self Care | Payer: PRIVATE HEALTH INSURANCE | Attending: Emergency Medicine | Admitting: Emergency Medicine

## 2015-05-20 ENCOUNTER — Emergency Department (HOSPITAL_BASED_OUTPATIENT_CLINIC_OR_DEPARTMENT_OTHER): Payer: PRIVATE HEALTH INSURANCE

## 2015-05-20 DIAGNOSIS — Z79899 Other long term (current) drug therapy: Secondary | ICD-10-CM | POA: Insufficient documentation

## 2015-05-20 DIAGNOSIS — I10 Essential (primary) hypertension: Secondary | ICD-10-CM | POA: Insufficient documentation

## 2015-05-20 DIAGNOSIS — Z853 Personal history of malignant neoplasm of breast: Secondary | ICD-10-CM | POA: Diagnosis not present

## 2015-05-20 DIAGNOSIS — Z8739 Personal history of other diseases of the musculoskeletal system and connective tissue: Secondary | ICD-10-CM | POA: Diagnosis not present

## 2015-05-20 DIAGNOSIS — R0989 Other specified symptoms and signs involving the circulatory and respiratory systems: Secondary | ICD-10-CM | POA: Insufficient documentation

## 2015-05-20 DIAGNOSIS — Z85850 Personal history of malignant neoplasm of thyroid: Secondary | ICD-10-CM | POA: Insufficient documentation

## 2015-05-20 DIAGNOSIS — F419 Anxiety disorder, unspecified: Secondary | ICD-10-CM | POA: Insufficient documentation

## 2015-05-20 DIAGNOSIS — K219 Gastro-esophageal reflux disease without esophagitis: Secondary | ICD-10-CM | POA: Diagnosis not present

## 2015-05-20 DIAGNOSIS — Z8571 Personal history of Hodgkin lymphoma: Secondary | ICD-10-CM | POA: Diagnosis not present

## 2015-05-20 DIAGNOSIS — Z8639 Personal history of other endocrine, nutritional and metabolic disease: Secondary | ICD-10-CM | POA: Diagnosis not present

## 2015-05-20 HISTORY — DX: Primary adrenocortical insufficiency: E27.1

## 2015-05-20 LAB — CBC
HCT: 40 % (ref 36.0–46.0)
HEMOGLOBIN: 12.9 g/dL (ref 12.0–15.0)
MCH: 28 pg (ref 26.0–34.0)
MCHC: 32.3 g/dL (ref 30.0–36.0)
MCV: 87 fL (ref 78.0–100.0)
PLATELETS: 236 10*3/uL (ref 150–400)
RBC: 4.6 MIL/uL (ref 3.87–5.11)
RDW: 14.1 % (ref 11.5–15.5)
WBC: 9.4 10*3/uL (ref 4.0–10.5)

## 2015-05-20 LAB — BASIC METABOLIC PANEL
ANION GAP: 7 (ref 5–15)
BUN: 16 mg/dL (ref 6–20)
CALCIUM: 9.4 mg/dL (ref 8.9–10.3)
CHLORIDE: 104 mmol/L (ref 101–111)
CO2: 28 mmol/L (ref 22–32)
CREATININE: 0.9 mg/dL (ref 0.44–1.00)
GFR calc non Af Amer: >60 mL/min (ref >60)
Glucose, Bld: 101 mg/dL — ABNORMAL HIGH (ref 65–99)
Potassium: 4.3 mmol/L (ref 3.5–5.1)
SODIUM: 139 mmol/L (ref 135–145)

## 2015-05-20 LAB — TROPONIN I

## 2015-05-20 MED ORDER — IOHEXOL 300 MG/ML  SOLN
75.0000 mL | Freq: Once | INTRAMUSCULAR | Status: AC | PRN
  Administered 2015-05-20: 75 mL via INTRAVENOUS

## 2015-05-20 MED ORDER — GI COCKTAIL ~~LOC~~
30.0000 mL | Freq: Once | ORAL | Status: AC
  Administered 2015-05-20: 30 mL via ORAL
  Filled 2015-05-20: qty 30

## 2015-05-20 MED ORDER — DIPHENHYDRAMINE HCL 50 MG/ML IJ SOLN
25.0000 mg | Freq: Once | INTRAMUSCULAR | Status: AC
Start: 2015-05-20 — End: 2015-05-20
  Administered 2015-05-20: 25 mg via INTRAVENOUS
  Filled 2015-05-20: qty 1

## 2015-05-20 MED ORDER — FENTANYL CITRATE (PF) 100 MCG/2ML IJ SOLN
50.0000 ug | Freq: Once | INTRAMUSCULAR | Status: AC
  Administered 2015-05-20: 50 ug via INTRAVENOUS
  Filled 2015-05-20: qty 2

## 2015-05-20 MED ORDER — SUCRALFATE 1 G PO TABS: 1.0000 g | ORAL_TABLET | Freq: Three times a day (TID) | ORAL | Status: DC

## 2015-05-20 MED ORDER — DIAZEPAM 5 MG/ML IJ SOLN
5.0000 mg | Freq: Once | INTRAMUSCULAR | Status: AC
  Administered 2015-05-20: 5 mg via INTRAVENOUS
  Filled 2015-05-20: qty 2

## 2015-05-20 MED ORDER — DIAZEPAM 5 MG PO TABS: 5.0000 mg | ORAL_TABLET | Freq: Two times a day (BID) | ORAL | Status: DC

## 2015-05-20 MED ORDER — GLUCAGON HCL RDNA (DIAGNOSTIC) 1 MG IJ SOLR
1.0000 mg | Freq: Once | INTRAMUSCULAR | Status: AC
  Administered 2015-05-20: 1 mg via INTRAVENOUS
  Filled 2015-05-20: qty 1

## 2015-05-20 NOTE — ED Notes (Signed)
Pt states she woke this am with feeling of something stuck in her throat-called her PCP-was advised to eat food-pt states when she eats if feels like it gets stuck and she vomits "some times"-is able to drink liquid but feels discomfort-pt NAD-is handling own saliva

## 2015-05-20 NOTE — Discharge Instructions (Signed)
Take the prescribed medication as directed.  Continue your Dexilant. You may also take Tums, Maalox, or other over-the-counter antacids if needed. Follow-up with [facility]-- call to make appt. It is also recommended that you have a repeat chest CT in 6 months for close monitoring of lung nodule which has been present since 2011. Your primary care physician may schedule this for you. Return to the ED for new or worsening symptoms.

## 2015-05-20 NOTE — ED Notes (Signed)
MD at bedside.

## 2015-05-20 NOTE — ED Provider Notes (Signed)
CSN: [id number]     Arrival date & time 05/20/15  1448 History   First MD Initiated Contact with Patient 05/20/15 1535     Chief Complaint  Patient presents with  . ?FB esophageal      (Consider location/radiation/quality/duration/timing/severity/associated sxs/prior Treatment) The history is provided by the patient and medical records.   This is a 44 year old female with history of hypertension, GERD, Addison's disease, multiple cancers including Hodgkin's lymphoma, breast cancer, thyroid cancer, and suture tumor, presenting to the ED for difficulty swallowing. Patient states she woke up this morning with a sensation of foreign body in her throat. She states recently whenever she eats solid food she feels that it gets stuck. She is able to drink liquids but states there is some discomfort.  Patient states she does routinely regurgitate food into her throat but does not vomit.  Patient takes daily Dexilant daily, has not missed any doses.  She did eat some tater tots last night in case there was something stuck in her throat in the hopes that it would "knock it loose" however no relief. Patient did have EGD and colonscopy 2 years ago-- she did have some polyps and findings are concerning for H pylori at that time but only medical management was recommended.  Father does have hx of Barrett's esophagus and esophageal cancer.  Patient denies any fever or chills.  No chest pain or SOB.  VSS.  Past Medical History  Diagnosis Date  . Hodgkin lymphoma     STATUS POST MANTLE RADIATION  . Breast cancer     STATUS POST BILATERAL MASTECTOMY. STATUS POST RECONSTRUCTION. SHE HAD SILICONE BREAST IMPLANTS AND THE LEFT IMPLANT IS LEAKING SLIGHTLY  . Thyroid cancer     STATUS POST SURGICAL REMOVAL-CURRENT ON THYROID REPLACEMENT  . Adrenal insufficiency   . Tachycardia   . Palpitations   . Chest pain   . Hypertension   . Raynaud phenomenon   . Appendicitis 12/19/09  . GERD (gastroesophageal reflux disease)    . Aortic stenosis   . Aortic stenosis   . SVT (supraventricular tachycardia)   . Osteoporosis   . Addison's disease    Past Surgical History  Procedure Laterality Date  . Pituitary surgery    . Cardiac catheterization  05/18/09    NORMAL CATH  . Appendectomy    . Abdominal hysterectomy     Family History  Problem Relation Age of Onset  . Family history unknown: Yes   Social History  Substance Use Topics  . Smoking status: Never Smoker   . Smokeless tobacco: None  . Alcohol Use: Yes     Comment: occassionally   OB History    No data available     Review of Systems  Gastrointestinal:       Difficulty swallowing  All other systems reviewed and are negative.     Allergies  Tape; Buprenorphine hcl; Compazine; Cymbalta; Dilaudid; Lorazepam; Metoclopramide hcl; Morphine and related; Na ferric gluc cplx in sucrose; Other; Promethazine hcl; Tegaderm ag mesh; Zofran; Escitalopram; Lorazepam; and Ondansetron  Home Medications   Prior to Admission medications   Medication Sig Start Date End Date Taking? Authorizing Provider  amphetamine-dextroamphetamine (ADDERALL) 20 MG tablet Take 20 mg by mouth 2 (two) times daily.     Historical Provider, MD  clonazePAM (KLONOPIN) 0.5 MG tablet Take 0.5 mg by mouth 2 (two) times daily as needed. For anxiety.    Historical Provider, MD  dexlansoprazole (DEXILANT) 60 MG capsule Take 60 mg by  mouth daily.      Historical Provider, MD  Diclofenac Potassium 50 MG PACK Take 1 application by mouth as needed. For migraines    Historical Provider, MD  hydrocortisone (CORTEF) 10 MG tablet Take 10-15 mg by mouth 2 (two) times daily.    Historical Provider, MD  levothyroxine (SYNTHROID, LEVOTHROID) 100 MCG tablet Take 100 mcg by mouth daily.      Historical Provider, MD  metoprolol succinate (TOPROL-XL) 25 MG 24 hr tablet Take 3 tablets (75 mg total) by mouth daily. Take with or immediately following a meal. 12/15/14   Thayer Headings, MD  propranolol  (INDERAL) 10 MG tablet Take 1 tablet (10 mg total) by mouth 4 (four) times daily as needed. 08/11/13   Thayer Headings, MD  rosuvastatin (CRESTOR) 5 MG tablet Take 1-2 tablets (5-10 mg total) by mouth daily. 02/17/15   Thayer Headings, MD  zolpidem (AMBIEN CR) 12.5 MG CR tablet Take 12.5 mg by mouth at bedtime.      Historical Provider, MD   BP 123/86 mmHg  Pulse 100  Temp(Src) 98.2 F (36.8 C) (Oral)  Resp 9  Ht _0  (1.651 m)  Wt 139 lb (63.05 kg)  BMI 23.13 kg/m2  SpO2 100%   Physical Exam  Constitutional: She is oriented to person, place, and time. She appears well-developed and well-nourished. No distress.  HENT:  Head: Normocephalic and atraumatic.  Mouth/Throat: Oropharynx is clear and moist.  Handling secretions well, swallowing saliva without difficulty  Eyes: Conjunctivae and EOM are normal. Pupils are equal, round, and reactive to light.  Neck: Normal range of motion. Neck supple.  Prior thyroidectomy scar noted, normal phonation, no stridor, no tracheal deviation  Cardiovascular: Normal rate, regular rhythm and normal heart sounds.   Pulmonary/Chest: Effort normal and breath sounds normal. No respiratory distress. She has no wheezes.  Abdominal: Soft. Bowel sounds are normal. There is no tenderness. There is no guarding.  Musculoskeletal: Normal range of motion. She exhibits no edema.  Neurological: She is alert and oriented to person, place, and time.  Skin: Skin is warm and dry. She is not diaphoretic.  Psychiatric: Her mood appears anxious.  Very anxious appearing, tearful  Nursing note and vitals reviewed.   ED Course  Procedures (including critical care time) Labs Review Labs Reviewed  BASIC METABOLIC PANEL - Abnormal; Notable for the following:    Glucose, Bld 101 (*)    All other components within normal limits  CBC  TROPONIN I    Imaging Review Dg Chest 2 View  05/20/2015   CLINICAL DATA:  Difficulty swallowing.  Foreign body sensation.  EXAM: CHEST   2 VIEW  COMPARISON:  01/20/2012.  FINDINGS: Cardiopericardial silhouette within normal limits. Mediastinal contours normal. Trachea midline. No airspace disease or effusion. Bilateral axillary dissection clips are present. No radiopaque foreign body. Peritracheal soft tissues appear normal.  IMPRESSION: No active cardiopulmonary disease.   Electronically Signed   By: Dereck Ligas M.D.   On: 05/20/2015 15:52   Ct Soft Tissue Neck W Contrast  05/20/2015   CLINICAL DATA:  44 year old with anterior neck pain above the manubrium. Nausea and difficulty swallowing.  EXAM: CT NECK WITH CONTRAST  TECHNIQUE: Multidetector CT imaging of the neck was performed using the standard protocol following the bolus administration of intravenous contrast.  CONTRAST:  67m OMNIPAQUE IOHEXOL 300 MG/ML  SOLN  COMPARISON:  Chest CT 05/20/2015 and MRI 05/18/2011  FINDINGS: Pharynx and larynx: No acute abnormality. No significant soft  tissue swelling. Normal appearance of the parapharyngeal fat.  Salivary glands: Symmetric appearance of the submandibular glands. There is a small nodule along the anterior and superior left parotid gland. This could represent a small salivary gland tumor versus intra parotid lymph node. This was probably present on MRI from 05/18/2011.  Thyroid: Thyroid tissue is absent.  Lymph nodes: No suspicious lymphadenopathy.  Vascular: Noncalcified plaque in the right innominate artery causing approximately 40% narrowing of the vessel. Great vessels are patent. Internal jugular veins are patent bilaterally. Calcifications involving the carotid arteries. Poor visualization of the internal carotid arteries. Limited evaluation of the intracranial vasculature.  Limited intracranial: No gross abnormality.  Visualized orbits: No acute abnormality.  Mastoids and visualized paranasal sinuses: Visualized paranasal sinuses are clear. Mastoid air cells are clear.  Skeleton: Normal alignment of the cervical spine. No acute  bone abnormality.  Upper chest: See chest CT from same day.  Lung apices are clear.  IMPRESSION: No acute abnormality in the neck.  There is a 7 mm nodule along the anterior left parotid gland. This is probably unchanged since 2012 and probably a benign finding. Differential diagnosis would include intra parotid lymph node versus a small salivary tumor. This may be further characterized with ultrasound.  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   Electronically Signed   By: Markus Daft M.D.   On: 05/20/2015 17:20   Ct Chest W Contrast  05/20/2015   CLINICAL DATA:  Anterior neck pain just above manubrium. Nausea. Difficulty swallowing. Remote history of Hodgkin's lymphoma and bilateral breast cancer and thyroid cancer.  EXAM: CT CHEST WITH CONTRAST  TECHNIQUE: Multidetector CT imaging of the chest was performed during intravenous contrast administration.  CONTRAST:  37m OMNIPAQUE IOHEXOL 300 MG/ML  SOLN  COMPARISON:  01/28/2010  FINDINGS: Heart is normal size. Aorta is normal caliber. No mediastinal, hilar, or axillary adenopathy.  Bilateral breast implants are noted. Surgical clips in the left axilla. Soft tissues at the base of the neck are unremarkable.  9 mm nodule noted at the right lung base along the right hemidiaphragm. This was difficult to visualize on the prior study but was likely present and measured 6 mm. No additional pulmonary nodules or confluent airspace opacities. No effusions.  Imaging into the upper abdomen shows no acute findings.  No acute bony abnormality or focal bone lesion.  IMPRESSION: 9 mm nodule along the right hemidiaphragm in the right lower lobe. This demonstrates slow interval growth since 2011. This is nonspecific. This could be followed with repeat chest CT in 6 months.   Electronically Signed   By: KRolm BaptiseM.D.   On: 05/20/2015 17:10   I have personally reviewed and evaluated these images and lab results as part of my medical  decision-making.   EKG Interpretation None      MDM   Final diagnoses:  Sensation of foreign body in throat   44year old female here with difficulty swallowing upon waking this morning.  Patient is afebrile, nontoxic. She does appear very anxious and is tearful on exam. She is handling her secretions well currently. Lab work was obtained which is reassuring. Chest x-ray is clear. Attempted treatment with fentanyl, Benadryl, and glucagon without significant improvement. Patient has multiple medication allergies and cannot tolerate most anti-emetics nor ativan.  Patient continues to be increasingly more anxious, was given dose of valium with some improvement.  Given patient's history of multiple cancers and first degree relative with esophageal cancer will obtain CT  soft tissue neck and chest for further evaluation.  CT scans remarkable for nodule of left parotid as well as right lung nodule.  Lung nodule is known to patient, parotid nodule appears new, however she reports she has not had detailed imaging of her neck since 2010 so unsure when this actually appeared.  Patient did have one episode of emesis here in the ED where she vomited up pieces of tater tots which she ate yesterday evening. She states she feels better after vomiting. She now reports burning sensation in her throat which was relieved with GI cocktail.  Vital signs remained stable. Patient has tolerated ginger ale and crackers here in ED without difficulty. She appears stable for discharge.  I have recommended that patient follow up with GI, she was given new referrals as her GI physician has retired. She will continue her Dexilant, add Carafate. Prescription for Valium also given if needed for her anxiety.  I have also recommended that she have close follow-up regarding parotid and lung nodule which can be arranged by her PCP. I've also given her copies of her imaging reports for physician review.  Discussed plan with patient, he/she  acknowledged understanding and agreed with plan of care.  Return precautions given for new or worsening symptoms.  Case discussed with attending physician, Dr. Jeneen Rinks, who agrees with assessment and plan of care.  Larene Pickett, PA-C 05/20/15 1905  Tanna Furry, MD 05/27/15 [phone]

## 2015-05-23 ENCOUNTER — Telehealth: Payer: Self-pay | Admitting: Cardiovascular Disease

## 2015-05-23 NOTE — Telephone Encounter (Signed)
Dr. Acie Fredrickson attempted to call patient; see separate telephone encounter dated today, 8/22

## 2015-05-23 NOTE — Telephone Encounter (Signed)
I called patient and left message. I said that i would try to call her again tomorrow or Wednesday     Thayer Headings, MD  05/23/2015 6:01 PM    [address],  [address] [place], [place]  [address] Pager [phone] Phone: [phone]; Fax: [phone]   [facility]  [address] [place], [place]  [address] [phone]   Fax [phone]

## 2015-05-23 NOTE — Telephone Encounter (Signed)
New message      Pt was in ER this weekend.  She need to talk to you about the ER results and see what Dr Acie Fredrickson want to do

## 2015-05-23 NOTE — Telephone Encounter (Signed)
Spoke with patient who has some concerns regarding CT results from ED visit at [facility] yesterday that she wants to discuss with Dr. Acie Fredrickson.  She was admitted for difficulty swallowing and has results that were concerning to her that she would like Dr. Acie Fredrickson to review.  I advised her that he will be in the office this afternoon and that I will give him message and that we will call her before the end of the day today.  She verbalized understanding and agreement.

## 2015-05-25 ENCOUNTER — Telehealth: Payer: Self-pay | Admitting: Nurse Practitioner

## 2015-05-25 DIAGNOSIS — R0989 Other specified symptoms and signs involving the circulatory and respiratory systems: Secondary | ICD-10-CM

## 2015-05-25 NOTE — Telephone Encounter (Signed)
Received message from Dr. Acie Fredrickson that he has spoken to patient and to order carotid duplex for bilateral carotid bruits.  I have ordered test and sent message to schedulers to call patient for appointment.

## 2015-06-01 ENCOUNTER — Ambulatory Visit (HOSPITAL_COMMUNITY)
Admission: RE | Admit: 2015-06-01 | Discharge: 2015-06-01 | Disposition: A | Discharge status: Home / Self Care | Payer: PRIVATE HEALTH INSURANCE | Source: Ambulatory Visit | Attending: Cardiovascular Disease | Admitting: Cardiovascular Disease

## 2015-06-01 DIAGNOSIS — I10 Essential (primary) hypertension: Secondary | ICD-10-CM | POA: Diagnosis not present

## 2015-06-01 DIAGNOSIS — R0989 Other specified symptoms and signs involving the circulatory and respiratory systems: Secondary | ICD-10-CM | POA: Diagnosis not present

## 2015-06-01 DIAGNOSIS — E785 Hyperlipidemia, unspecified: Secondary | ICD-10-CM | POA: Diagnosis not present

## 2015-06-01 DIAGNOSIS — F17200 Nicotine dependence, unspecified, uncomplicated: Secondary | ICD-10-CM | POA: Diagnosis not present

## 2015-06-01 DIAGNOSIS — I6523 Occlusion and stenosis of bilateral carotid arteries: Secondary | ICD-10-CM | POA: Diagnosis not present

## 2015-06-16 ENCOUNTER — Emergency Department (HOSPITAL_COMMUNITY)
Admission: EM | Admit: 2015-06-16 | Discharge: 2015-06-16 | Discharge status: Against Medical Advice | Payer: PRIVATE HEALTH INSURANCE | Source: Home / Self Care

## 2015-06-16 ENCOUNTER — Emergency Department (HOSPITAL_COMMUNITY): Payer: PRIVATE HEALTH INSURANCE

## 2015-06-16 ENCOUNTER — Encounter (HOSPITAL_COMMUNITY): Payer: Self-pay | Admitting: Emergency Medicine

## 2015-06-16 ENCOUNTER — Emergency Department (HOSPITAL_COMMUNITY)
Admission: EM | Admit: 2015-06-16 | Discharge: 2015-06-16 | Disposition: A | Discharge status: Home / Self Care | Payer: PRIVATE HEALTH INSURANCE | Attending: Emergency Medicine | Admitting: Emergency Medicine

## 2015-06-16 DIAGNOSIS — I350 Nonrheumatic aortic (valve) stenosis: Secondary | ICD-10-CM | POA: Diagnosis not present

## 2015-06-16 DIAGNOSIS — Z7952 Long term (current) use of systemic steroids: Secondary | ICD-10-CM | POA: Diagnosis not present

## 2015-06-16 DIAGNOSIS — Z853 Personal history of malignant neoplasm of breast: Secondary | ICD-10-CM | POA: Diagnosis not present

## 2015-06-16 DIAGNOSIS — Z79899 Other long term (current) drug therapy: Secondary | ICD-10-CM | POA: Diagnosis not present

## 2015-06-16 DIAGNOSIS — Z85850 Personal history of malignant neoplasm of thyroid: Secondary | ICD-10-CM | POA: Diagnosis not present

## 2015-06-16 DIAGNOSIS — Z8589 Personal history of malignant neoplasm of other organs and systems: Secondary | ICD-10-CM | POA: Insufficient documentation

## 2015-06-16 DIAGNOSIS — K219 Gastro-esophageal reflux disease without esophagitis: Secondary | ICD-10-CM | POA: Insufficient documentation

## 2015-06-16 DIAGNOSIS — N7011 Chronic salpingitis: Secondary | ICD-10-CM | POA: Insufficient documentation

## 2015-06-16 DIAGNOSIS — R109 Unspecified abdominal pain: Secondary | ICD-10-CM | POA: Diagnosis present

## 2015-06-16 DIAGNOSIS — I10 Essential (primary) hypertension: Secondary | ICD-10-CM | POA: Diagnosis not present

## 2015-06-16 DIAGNOSIS — Z9889 Other specified postprocedural states: Secondary | ICD-10-CM | POA: Insufficient documentation

## 2015-06-16 DIAGNOSIS — Z87442 Personal history of urinary calculi: Secondary | ICD-10-CM | POA: Insufficient documentation

## 2015-06-16 DIAGNOSIS — Z9049 Acquired absence of other specified parts of digestive tract: Secondary | ICD-10-CM | POA: Insufficient documentation

## 2015-06-16 DIAGNOSIS — Z8639 Personal history of other endocrine, nutritional and metabolic disease: Secondary | ICD-10-CM | POA: Insufficient documentation

## 2015-06-16 DIAGNOSIS — Z8739 Personal history of other diseases of the musculoskeletal system and connective tissue: Secondary | ICD-10-CM | POA: Insufficient documentation

## 2015-06-16 DIAGNOSIS — R1900 Intra-abdominal and pelvic swelling, mass and lump, unspecified site: Secondary | ICD-10-CM

## 2015-06-16 DIAGNOSIS — E271 Primary adrenocortical insufficiency: Secondary | ICD-10-CM | POA: Diagnosis not present

## 2015-06-16 LAB — COMPREHENSIVE METABOLIC PANEL
ALK PHOS: 44 U/L (ref 38–126)
ALT: 24 U/L (ref 14–54)
AST: 25 U/L (ref 15–41)
Albumin: 3.7 g/dL (ref 3.5–5.0)
Anion gap: 7 (ref 5–15)
BUN: 10 mg/dL (ref 6–20)
CALCIUM: 8.8 mg/dL — AB (ref 8.9–10.3)
CHLORIDE: 102 mmol/L (ref 101–111)
CO2: 29 mmol/L (ref 22–32)
CREATININE: 0.92 mg/dL (ref 0.44–1.00)
GFR calc non Af Amer: >60 mL/min (ref >60)
GLUCOSE: 101 mg/dL — AB (ref 65–99)
Potassium: 4.2 mmol/L (ref 3.5–5.1)
SODIUM: 138 mmol/L (ref 135–145)
Total Bilirubin: 0.2 mg/dL — ABNORMAL LOW (ref 0.3–1.2)
Total Protein: 6.7 g/dL (ref 6.5–8.1)

## 2015-06-16 LAB — CBC WITH DIFFERENTIAL/PLATELET
BASOS PCT: 1 %
Basophils Absolute: 0.1 10*3/uL (ref 0.0–0.1)
EOS ABS: 0.1 10*3/uL (ref 0.0–0.7)
EOS PCT: 3 %
HCT: 39.6 % (ref 36.0–46.0)
Hemoglobin: 13 g/dL (ref 12.0–15.0)
LYMPHS ABS: 1.8 10*3/uL (ref 0.7–4.0)
Lymphocytes Relative: 34 %
MCH: 28.4 pg (ref 26.0–34.0)
MCHC: 32.8 g/dL (ref 30.0–36.0)
MCV: 86.5 fL (ref 78.0–100.0)
MONO ABS: 0.8 10*3/uL (ref 0.1–1.0)
MONOS PCT: 16 %
Neutro Abs: 2.4 10*3/uL (ref 1.7–7.7)
Neutrophils Relative %: 46 %
Platelets: 270 10*3/uL (ref 150–400)
RBC: 4.58 MIL/uL (ref 3.87–5.11)
RDW: 13.8 % (ref 11.5–15.5)
WBC: 5.2 10*3/uL (ref 4.0–10.5)

## 2015-06-16 LAB — URINALYSIS, ROUTINE W REFLEX MICROSCOPIC
BILIRUBIN URINE: NEGATIVE
GLUCOSE, UA: NEGATIVE mg/dL
HGB URINE DIPSTICK: NEGATIVE
Ketones, ur: NEGATIVE mg/dL
Leukocytes, UA: NEGATIVE
Nitrite: NEGATIVE
PROTEIN: NEGATIVE mg/dL
Specific Gravity, Urine: 1.031 — ABNORMAL HIGH (ref 1.005–1.030)
UROBILINOGEN UA: 1 mg/dL (ref 0.0–1.0)
pH: 7 (ref 5.0–8.0)

## 2015-06-16 MED ORDER — IOHEXOL 300 MG/ML  SOLN
25.0000 mL | INTRAMUSCULAR | Status: AC
  Administered 2015-06-16: 25 mL via ORAL

## 2015-06-16 MED ORDER — DIPHENHYDRAMINE HCL 50 MG/ML IJ SOLN
25.0000 mg | Freq: Once | INTRAMUSCULAR | Status: AC
  Administered 2015-06-16: 25 mg via INTRAVENOUS
  Filled 2015-06-16: qty 1

## 2015-06-16 MED ORDER — IOHEXOL 300 MG/ML  SOLN
25.0000 mL | INTRAMUSCULAR | Status: DC
Start: 2015-06-16 — End: 2015-06-16

## 2015-06-16 MED ORDER — DIPHENHYDRAMINE HCL 50 MG/ML IJ SOLN
25.0000 mg | Freq: Once | INTRAMUSCULAR | Status: DC
  Filled 2015-06-16: qty 1

## 2015-06-16 MED ORDER — FENTANYL CITRATE (PF) 100 MCG/2ML IJ SOLN
100.0000 ug | Freq: Once | INTRAMUSCULAR | Status: AC
  Administered 2015-06-16: 100 ug via INTRAVENOUS
  Filled 2015-06-16: qty 2

## 2015-06-16 MED ORDER — IOHEXOL 300 MG/ML  SOLN
100.0000 mL | Freq: Once | INTRAMUSCULAR | Status: AC | PRN
  Administered 2015-06-16: 100 mL via INTRAVENOUS

## 2015-06-16 NOTE — ED Notes (Signed)
Pt's name called for triage x2 no answer

## 2015-06-16 NOTE — ED Notes (Signed)
Pt reports sudden onset RLQ pain this Am. Denies fever, vomiting, diarrhea. Pt awake, alert, oriented x4, VSS.

## 2015-06-16 NOTE — ED Notes (Signed)
CT made aware pt finished her oral contrast. Reports when she drinks the contrast pain radiates to LLQ.

## 2015-06-16 NOTE — ED Notes (Signed)
Patient transported to CT 

## 2015-06-16 NOTE — Discharge Instructions (Signed)
Pelvic Mass A "mass" is a lump that either your caregiver found during an examination or you found before seeing your caregiver. The "pelvis" is the lower portion of the trunk in between the hip bones. There are many possible reasons why a lump has appeared. Testing will help determine the cause and the steps to a solution. CAUSES  Before complete testing is done, it may be difficult or impossible for your caregiver to know if the lump is truly in one of the pelvic organs (such as the uterus or ovaries) or is coming from one of many organs that are near the pelvis. Problems in the colon or kidney can also lead to a lump that might seem to be in the pelvis. If testing shows that the mass is in the pelvis, there are still many possible causes:  Tumors and cancers. These problems are relatively common and are the greatest source of worry for patients. Cancerous lumps in the pelvis may be due to cancers that started in the uterus or ovaries or due to cancers that started in other areas and then spread to the pelvis. Many cancers are very treatable when found early.  Non-cancerous tumors and masses. There are a large number of common and uncommon non-cancerous problems that can lead to a mass in the pelvis. Two very common ones are fibroids of the uterus and ovarian cysts. Before testing and/or surgery, it may be impossible to tell the difference between these problems and a cancer.  Infection. Certain types of infections can produce a mass in the pelvis. The infection might be caused by bacteria. If there is an infection treatment might include antibiotics. Masses from infection can also be caused by certain viruses, and in rare cases, by fungi or parasites. If infection is the cause, your caregiver will be able to determine the type of germ responsible for the mass by doing appropriate testing.  Inflammatory bowel disease. These are diseases thought to be caused by a defect in the immune system of the  intestine. There are two inflammatory bowel diseases: Ulcerative Colitis and Crohn's Disease. They are lifelong problems with symptoms that can come and go. Sometimes, patients with these diseases will develop a mass in the lower part of the colon that can make it seem as though there is a mass in the pelvis.  Past Surgery. If there has been pelvic surgery in the past, and there is a lot of scarring that forms during the process of healing, this can eventually fell like a mass when examined by your caregiver. As with the other problems described above, this may or may not be associated with symptoms or feeling badly.  Ectopic Pregnancy. This is a condition where the growing fetus is growing outside of the uterus. This is a common cause for a pelvic mass and may become a serious or life-threatening problem that requires immediate surgery. SYMPTOMS  In people with a pelvic mass, there may be a large variety of associated symptoms including:   No symptoms, other than the appearance of the mass itself.  Cramping, nausea, diarrhea.  Fever, vomiting, weakness.  Pelvic, side, and/or back pain.  Weight loss.  Constipation.  Problems with vaginal bleeding. This can be very variable. Bleeding might be very light or very heavy. Bleeding may be mixed with large clots. Menstruation may be very frequent and may seem to almost completely stop. There may be varying levels of pain with menstruation.  Urinary symptoms including frequent urination, inability to empty the  bladder completely, or urinating very small amounts. DIAGNOSIS  Because of the large number of causes of a mass in the pelvis, your caregiver will ask you to undergo testing in order to get a clear diagnosis in a timely manner. The tests might include some or all of the following:  Blood tests such as a blood count, measurement of common minerals in the blood, kidney/liver/pancreas function, pregnancy test, and others.  X-rays. Plain x-rays  and special x-rays may be requested except if you are pregnant.  Ultrasound. This is a test that uses sound waves to "paint a picture" of the mass. The type of "sound picture" that is seen can help to narrow the diagnosis.  CAT scan and MRI imaging. Each can provide additional information as to the different characteristics of the mass and can help to develop a final plan for diagnostics and treatment. If cancer is suspected, these special tests can also help to show any spread of the cancer to other parts of the body. It is possible that these tests may not be ordered if you are pregnant.  Laparoscopy. This is a special exam of the inside of the pelvic area using a slim, flexible, lighted tube. This allows your caregiver to get a direct look at the mass. Sometimes, this allows getting a very small piece of the mass (a biopsy). This piece of tissue can then be examined in a lab that will frequently lead to a clear diagnosis. In some cases, your caregiver can use a laparoscope to completely remove the mass after it has been examined.  Surgery. Sometimes, a diagnosis can only be made by carrying out an operation and obtaining a biopsy (as noted above). Many times, the biopsy is obtained and the mass is removed during the same operation. These are the most common ways for determining the exact cause of the mass. Your caregiver may recommend other tests that are not listed here. TREATMENT  Treatment(s) can only be recommended after a diagnosis is made. Your caregiver will discuss your test results with you, the meanings of the tests, and the recommended steps to begin treatment. He/she will also recommend whether you need to be examined by specialists as you go through the steps of diagnosis and a treatment plan is developed. HOME CARE INSTRUCTIONS   Test preparation. Carefully follow instructions when preparing for certain tests. This may involve many things such as:  Drinking fluids to fill the bladder  before a pelvic ultrasound.  Fasting before certain blood tests.  Drinking special "contrast" fluids that are necessary for obtaining the best CAT scan and MRI images.  Medications. Your caregiver may prescribe medications to help relieve symptoms while you undergo testing. It is important that your current medications (prescription, non-prescription, herbal, vitamins, etc.) be kept in mind when new prescriptions are recommended.  Diet. There may be a need for changes in diet in order to help with symptom relief while testing is being done. If this applies to you, your caregiver will discuss these changes with you. SEEK MEDICAL CARE IF:   You cannot hold down any of the recommended fluids used to prepare for tests such as CAT scan MRI.  You feel that you are having trouble with any new prescriptions.  You develop new symptoms of pain, vomiting, diarrhea, fever, or other problems that you did not feel since your last exam.  You experience inability to empty your bladder completely or develop painful and/or bloody urination. SEEK IMMEDIATE MEDICAL CARE IF:  You vomit bright red blood, or a coffee ground appearing material.  You have blood in your stools, or the stools turn black and tarry.  You have an abnormal or increased amount of vaginal bleeding.  You have a fever.  You develop easy bruising or bleeding.  You develop pain that is not controlled by your medication.  You feel worsening weakness or you have a fainting episode.  You feel that the mass has suddenly gotten larger.  You develop severe bloating in the abdomen and/or pelvis.  You cannot pass any urine. MAKE SURE YOU:   Understand these instructions.  Will watch your condition.  Will get help right away if you are not doing well or get worse. Document Released: 12/25/2006 Document Revised: 12/10/2011 Document Reviewed: 09/02/2007 [facility] Patient Information 2015 [place], [place]. This information is not  intended to replace advice given to you by your health care provider. Make sure you discuss any questions you have with your health care provider.

## 2015-06-16 NOTE — ED Notes (Signed)
Pt's name called for triage no answer

## 2015-06-16 NOTE — ED Notes (Signed)
CT making pt a CD of CT images for oncologist at [facility].

## 2015-06-16 NOTE — ED Provider Notes (Signed)
CSN: [id number]     Arrival date & time 06/16/15  0744 History   First MD Initiated Contact with Patient 06/16/15 [phone]     Chief Complaint  Patient presents with  . Abdominal Pain     (Consider location/radiation/quality/duration/timing/severity/associated sxs/prior Treatment) HPI Comments: Pt comes in with right sided abdominal pain without radiation. Pain came on acutely this morning. She denies fever, vomiting and diarrhea. She states that she has history of multiple cancers. She denies dysuria. She has had her appendix removed and had a hysterectomy but she has one ovary but is not sure which one. She had a history of kidney stones but this doesn't feel similar.   The history is provided by the patient. No language interpreter was used.    Past Medical History  Diagnosis Date  . Hodgkin lymphoma     STATUS POST MANTLE RADIATION  . Breast cancer     STATUS POST BILATERAL MASTECTOMY. STATUS POST RECONSTRUCTION. SHE HAD SILICONE BREAST IMPLANTS AND THE LEFT IMPLANT IS LEAKING SLIGHTLY  . Thyroid cancer     STATUS POST SURGICAL REMOVAL-CURRENT ON THYROID REPLACEMENT  . Adrenal insufficiency   . Tachycardia   . Palpitations   . Chest pain   . Hypertension   . Raynaud phenomenon   . Appendicitis 12/19/09  . GERD (gastroesophageal reflux disease)   . Aortic stenosis   . Aortic stenosis   . SVT (supraventricular tachycardia)   . Osteoporosis   . Addison's disease    Past Surgical History  Procedure Laterality Date  . Pituitary surgery    . Cardiac catheterization  05/18/09    NORMAL CATH  . Appendectomy    . Abdominal hysterectomy     Family History  Problem Relation Age of Onset  . Family history unknown: Yes   Social History  Substance Use Topics  . Smoking status: Never Smoker   . Smokeless tobacco: None  . Alcohol Use: Yes     Comment: occassionally   OB History    No data available     Review of Systems  All other systems reviewed and are  negative.     Allergies  Tape; Buprenorphine hcl; Compazine; Cymbalta; Dilaudid; Lorazepam; Metoclopramide hcl; Morphine and related; Na ferric gluc cplx in sucrose; Other; Promethazine hcl; Tegaderm ag mesh; Zofran; Escitalopram; Lorazepam; and Ondansetron  Home Medications   Prior to Admission medications   Medication Sig Start Date End Date Taking? Authorizing Provider  amphetamine-dextroamphetamine (ADDERALL) 20 MG tablet Take 20 mg by mouth 2 (two) times daily.     Historical Provider, MD  clonazePAM (KLONOPIN) 0.5 MG tablet Take 0.5 mg by mouth 2 (two) times daily as needed. For anxiety.    Historical Provider, MD  dexlansoprazole (DEXILANT) 60 MG capsule Take 60 mg by mouth daily.      Historical Provider, MD  diazepam (VALIUM) 5 MG tablet Take 1 tablet (5 mg total) by mouth 2 (two) times daily. 05/20/15   Larene Pickett, PA-C  Diclofenac Potassium 50 MG PACK Take 1 application by mouth as needed. For migraines    Historical Provider, MD  hydrocortisone (CORTEF) 10 MG tablet Take 10-15 mg by mouth 2 (two) times daily.    Historical Provider, MD  levothyroxine (SYNTHROID, LEVOTHROID) 100 MCG tablet Take 100 mcg by mouth daily.      Historical Provider, MD  metoprolol succinate (TOPROL-XL) 25 MG 24 hr tablet Take 3 tablets (75 mg total) by mouth daily. Take with or immediately following a meal.  12/15/14   Thayer Headings, MD  propranolol (INDERAL) 10 MG tablet Take 1 tablet (10 mg total) by mouth 4 (four) times daily as needed. 08/11/13   Thayer Headings, MD  rosuvastatin (CRESTOR) 5 MG tablet Take 1-2 tablets (5-10 mg total) by mouth daily. 02/17/15   Thayer Headings, MD  sucralfate (CARAFATE) 1 G tablet Take 1 tablet (1 g total) by mouth 4 (four) times daily -  with meals and at bedtime. 05/20/15   Larene Pickett, PA-C  zolpidem (AMBIEN CR) 12.5 MG CR tablet Take 12.5 mg by mouth at bedtime.      Historical Provider, MD   BP 128/75 mmHg  Pulse 87  Temp(Src) 98.2 F (36.8 C) (Oral)   Resp 22  Ht _0  (1.651 m)  Wt 146 lb (66.225 kg)  BMI 24.30 kg/m2  SpO2 100% Physical Exam  Constitutional: She is oriented to person, place, and time. She appears well-developed and well-nourished.  Cardiovascular: Normal rate and regular rhythm.   Pulmonary/Chest: Effort normal and breath sounds normal.  Abdominal: Soft. Bowel sounds are normal. There is tenderness in the right lower quadrant and left lower quadrant.  Musculoskeletal: Normal range of motion.  Neurological: She is alert and oriented to person, place, and time.  Skin: Skin is warm and dry.  Psychiatric: She has a normal mood and affect.  Nursing note and vitals reviewed.   ED Course  Procedures (including critical care time) Labs Review Labs Reviewed  COMPREHENSIVE METABOLIC PANEL - Abnormal; Notable for the following:    Glucose, Bld 101 (*)    Calcium 8.8 (*)    Total Bilirubin 0.2 (*)    All other components within normal limits  URINALYSIS, ROUTINE W REFLEX MICROSCOPIC (NOT AT [facility]) - Abnormal; Notable for the following:    Specific Gravity, Urine 1.031 (*)    All other components within normal limits  CBC WITH DIFFERENTIAL/PLATELET    Imaging Review US Pelvis Complete  06/16/2015   CLINICAL DATA:  Pelvic pain.  Hysterectomy.  Left oophorectomy.  EXAM: TRANSABDOMINAL ULTRASOUND OF PELVIS  TECHNIQUE: Transabdominal ultrasound examination of the pelvis was performed including evaluation of the uterus, ovaries, adnexal regions, and pelvic cul-de-sac.  COMPARISON:  CT 06/16/2015.  FINDINGS: Uterus  Hysterectomy.  Right ovary  Not visualized. Large 6.8 x 6.1 x 3.8 cm tubular fluid-filled structure right adnexa. This correlates with prior CT and most likely represents hydrosalpinx. No solid mass lesion .  Left ovary  Not visualized consistent prior left oophorectomy.  Other findings:  No free fluid  IMPRESSION: Findings noted on prior CT of 06/16/2015 and consistent with a right hydrosalpinx. Follow-up pelvic  ultrasound in 6 months to demonstrate stability suggested .   Electronically Signed   By: Marcello Moores  Register   On: 06/16/2015 12:44   Ct Abdomen Pelvis W Contrast  06/16/2015   CLINICAL DATA:  61YWV with sudden onset RLQ pain this Am. NAUSEA Denies fever, vomiting, diarrhea. PMH: BREAST CA, HODGKINS LYMPHOMA, THYROID CA, APPENDECTOMY Adrenal insufficiency; Tachycardia; Palpitations; Chest pain.  EXAM: CT ABDOMEN AND PELVIS WITH CONTRAST  TECHNIQUE: Multidetector CT imaging of the abdomen and pelvis was performed using the standard protocol following bolus administration of intravenous contrast.  CONTRAST:  100 cc Omnipaque 300  COMPARISON:  Report from 02/03/2011 ; 01/23/2010  FINDINGS: Lower chest: 1.1 by 1.0 cm pleural-based nodular density along the right hemidiaphragm appears to have fat density on the multiplanar imaging, and accordingly is likely benign pleural adipose tissue.  Hepatobiliary: Hypodense 4 mm nonspecific lesion in the dome of the right hepatic lobe, image 4 series 201. Similar hypodense lesion in segment 4 a, 3 mm diameter, image 11 series 201.  Pancreas: Unremarkable  Spleen: Unremarkable  Adrenals/Urinary Tract: Multiple small right-sided hypodense lesions in the right kidney are technically too small to characterize although statistically likely to be benign.  Stomach/Bowel: Prominent stool throughout the colon favors constipation. Appendix surgically absent.  Vascular/Lymphatic: Mild abdominal aortic atherosclerotic calcification. No adenopathy.  Reproductive: 11.3 by 6.8 by 3.9 cm tubular curvilinear fluid density structure along the right adnexa/right lower pelvis. Higher density components anterior may represent residual right ovarian tissue. Uterus and left ovary absent.  Other: No supplemental non-categorized findings.  Musculoskeletal: A chronic right L5 pars defect without anterolisthesis. Mild right foraminal stenosis due to intervertebral spurring and spurring along the pars  region.  IMPRESSION: 1. Large tubular fluid collection in the right hemipelvis, 11.3 cm in long axis, probably a hydrosalpinx. Complex region anteriorly, most likely due to ovarian tissue. The appearance merits further investigation to exclude cystic ovarian mass. Pelvic sonography recommended. 2. Several small hepatic and right renal lesions are technically too small to characterize although statistically likely to be cysts. 3. Mild right foraminal stenosis at L5-S1 due to a chronic right L5 pars defect and intervertebral spurring. 4. The 1.1 cm pleural-based nodular density along the right hemidiaphragm appears to have fat density on multiplanar reconstruction, and accordingly this likely benign pleural adipose tissue.   Electronically Signed   By: Van Clines M.D.   On: 06/16/2015 10:32   I have personally reviewed and evaluated these images and lab results as part of my medical decision-making.   EKG Interpretation   Date/Time:  Thursday June 16 2015 07:52:41 EDT Ventricular Rate:  86 PR Interval:  147 QRS Duration: 63 QT Interval:  366 QTC Calculation: 438 R Axis:   60 Text Interpretation:  Sinus rhythm Probable left atrial enlargement  Baseline wander in lead(s) I V2 V4 V5 V6 No significant change since last  tracing Confirmed by KNAPP  MD-J, JON ([id number]) on 06/16/2015 7:57:32 AM      MDM   Final diagnoses:  Hydrosalpinx    Pt is refusing pain medication for home. Discussed follow up and return precautions. Pt okay with plan    Glendell Docker, NP 06/16/15 [place]  Dorie Rank, MD 06/16/15 1319

## 2015-08-04 ENCOUNTER — Other Ambulatory Visit: Payer: Self-pay | Admitting: Cardiovascular Disease

## 2015-08-17 ENCOUNTER — Other Ambulatory Visit: Payer: Self-pay | Admitting: Cardiovascular Disease

## 2015-09-05 ENCOUNTER — Other Ambulatory Visit: Payer: Self-pay | Admitting: Cardiovascular Disease

## 2015-09-05 NOTE — Telephone Encounter (Signed)
Susan Headings, MD at 05/17/2015 3:23 PM  metoprolol succinate (TOPROL-XL) 25 MG 24 hr tabletTake 3 tablets (75 mg total) by mouth daily. Take with or immediately following a meal 1. Palpitations/premature ventricular contractions - [name] is still having lots of palpitations that sound like paroxysmal supraventricular tachycardia. We discussed increasing her Toprol-XL back up to 75 mg a day which is her prescribed dose. Medication Instructions:  Your physician recommends that you continue on your current medications as directed. Please refer to the Current Medication list given to you today.  Medication Detail      Disp Refills Start End     metoprolol succinate (TOPROL-XL) 25 MG 24 hr tablet 270 tablet 3 12/15/2014     Sig - Route: Take 3 tablets (75 mg total) by mouth daily. Take with or immediately following a meal. - Oral    E-Prescribing Status: Receipt confirmed by pharmacy (12/15/2014 4:49 PM EDT)     [place], [address].

## 2015-09-15 ENCOUNTER — Encounter (HOSPITAL_COMMUNITY): Payer: Self-pay | Admitting: *Deleted

## 2015-09-15 ENCOUNTER — Emergency Department (HOSPITAL_COMMUNITY)
Admission: EM | Admit: 2015-09-15 | Discharge: 2015-09-15 | Disposition: A | Discharge status: Home / Self Care | Payer: PRIVATE HEALTH INSURANCE | Attending: Emergency Medicine | Admitting: Emergency Medicine

## 2015-09-15 DIAGNOSIS — K529 Noninfective gastroenteritis and colitis, unspecified: Secondary | ICD-10-CM | POA: Diagnosis not present

## 2015-09-15 DIAGNOSIS — Z8739 Personal history of other diseases of the musculoskeletal system and connective tissue: Secondary | ICD-10-CM | POA: Insufficient documentation

## 2015-09-15 DIAGNOSIS — I10 Essential (primary) hypertension: Secondary | ICD-10-CM | POA: Insufficient documentation

## 2015-09-15 DIAGNOSIS — Z85850 Personal history of malignant neoplasm of thyroid: Secondary | ICD-10-CM | POA: Diagnosis not present

## 2015-09-15 DIAGNOSIS — Z9889 Other specified postprocedural states: Secondary | ICD-10-CM | POA: Diagnosis not present

## 2015-09-15 DIAGNOSIS — R51 Headache: Secondary | ICD-10-CM | POA: Insufficient documentation

## 2015-09-15 DIAGNOSIS — Z853 Personal history of malignant neoplasm of breast: Secondary | ICD-10-CM | POA: Diagnosis not present

## 2015-09-15 DIAGNOSIS — Z7952 Long term (current) use of systemic steroids: Secondary | ICD-10-CM | POA: Diagnosis not present

## 2015-09-15 DIAGNOSIS — Z8639 Personal history of other endocrine, nutritional and metabolic disease: Secondary | ICD-10-CM | POA: Diagnosis not present

## 2015-09-15 DIAGNOSIS — Z9089 Acquired absence of other organs: Secondary | ICD-10-CM | POA: Insufficient documentation

## 2015-09-15 DIAGNOSIS — Z8571 Personal history of Hodgkin lymphoma: Secondary | ICD-10-CM | POA: Diagnosis not present

## 2015-09-15 DIAGNOSIS — K219 Gastro-esophageal reflux disease without esophagitis: Secondary | ICD-10-CM | POA: Diagnosis not present

## 2015-09-15 DIAGNOSIS — Z90710 Acquired absence of both cervix and uterus: Secondary | ICD-10-CM | POA: Insufficient documentation

## 2015-09-15 DIAGNOSIS — Z79899 Other long term (current) drug therapy: Secondary | ICD-10-CM | POA: Insufficient documentation

## 2015-09-15 DIAGNOSIS — R110 Nausea: Secondary | ICD-10-CM | POA: Diagnosis present

## 2015-09-15 LAB — COMPREHENSIVE METABOLIC PANEL
ALBUMIN: 3.3 g/dL — AB (ref 3.5–5.0)
ALT: 29 U/L (ref 14–54)
ANION GAP: 7 (ref 5–15)
AST: 24 U/L (ref 15–41)
Alkaline Phosphatase: 35 U/L — ABNORMAL LOW (ref 38–126)
BILIRUBIN TOTAL: 0.5 mg/dL (ref 0.3–1.2)
BUN: 11 mg/dL (ref 6–20)
CO2: 23 mmol/L (ref 22–32)
Calcium: 7.6 mg/dL — ABNORMAL LOW (ref 8.9–10.3)
Chloride: 110 mmol/L (ref 101–111)
Creatinine, Ser: 0.74 mg/dL (ref 0.44–1.00)
GFR calc Af Amer: >60 mL/min (ref >60)
Glucose, Bld: 83 mg/dL (ref 65–99)
POTASSIUM: 3.7 mmol/L (ref 3.5–5.1)
Sodium: 140 mmol/L (ref 135–145)
TOTAL PROTEIN: 5.8 g/dL — AB (ref 6.5–8.1)

## 2015-09-15 LAB — CBC WITH DIFFERENTIAL/PLATELET
BASOS PCT: 0 %
Basophils Absolute: 0 10*3/uL (ref 0.0–0.1)
Eosinophils Absolute: 0 10*3/uL (ref 0.0–0.7)
Eosinophils Relative: 0 %
HEMATOCRIT: 36.5 % (ref 36.0–46.0)
Hemoglobin: 11.6 g/dL — ABNORMAL LOW (ref 12.0–15.0)
Lymphocytes Relative: 34 %
Lymphs Abs: 0.9 10*3/uL (ref 0.7–4.0)
MCH: 28.2 pg (ref 26.0–34.0)
MCHC: 31.8 g/dL (ref 30.0–36.0)
MCV: 88.6 fL (ref 78.0–100.0)
MONO ABS: 0.4 10*3/uL (ref 0.1–1.0)
MONOS PCT: 16 %
NEUTROS ABS: 1.3 10*3/uL — AB (ref 1.7–7.7)
Neutrophils Relative %: 50 %
Platelets: 191 10*3/uL (ref 150–400)
RBC: 4.12 MIL/uL (ref 3.87–5.11)
RDW: 13.3 % (ref 11.5–15.5)
WBC: 2.7 10*3/uL — ABNORMAL LOW (ref 4.0–10.5)

## 2015-09-15 LAB — URINALYSIS, ROUTINE W REFLEX MICROSCOPIC
BILIRUBIN URINE: NEGATIVE
GLUCOSE, UA: NEGATIVE mg/dL
HGB URINE DIPSTICK: NEGATIVE
Ketones, ur: 15 mg/dL — AB
LEUKOCYTES UA: NEGATIVE
NITRITE: NEGATIVE
PROTEIN: NEGATIVE mg/dL
Specific Gravity, Urine: 1.017 (ref 1.005–1.030)
pH: 6.5 (ref 5.0–8.0)

## 2015-09-15 MED ORDER — HYDROCORTISONE NA SUCCINATE PF 100 MG IJ SOLR
100.0000 mg | Freq: Once | INTRAMUSCULAR | Status: AC
  Administered 2015-09-15: 100 mg via INTRAVENOUS
  Filled 2015-09-15: qty 2

## 2015-09-15 MED ORDER — SODIUM CHLORIDE 0.9 % IV SOLN: INTRAVENOUS | Status: DC

## 2015-09-15 MED ORDER — SODIUM CHLORIDE 0.9 % IV BOLUS (SEPSIS)
2000.0000 mL | Freq: Once | INTRAVENOUS | Status: AC
  Administered 2015-09-15: 2000 mL via INTRAVENOUS

## 2015-09-15 MED ORDER — FENTANYL CITRATE (PF) 100 MCG/2ML IJ SOLN
25.0000 ug | Freq: Once | INTRAMUSCULAR | Status: AC
  Administered 2015-09-15: 25 ug via INTRAVENOUS
  Filled 2015-09-15: qty 2

## 2015-09-15 MED ORDER — DIPHENHYDRAMINE HCL 50 MG/ML IJ SOLN
25.0000 mg | Freq: Once | INTRAMUSCULAR | Status: AC
  Administered 2015-09-15: 25 mg via INTRAVENOUS
  Filled 2015-09-15: qty 1

## 2015-09-15 NOTE — ED Provider Notes (Signed)
CSN: [id number]     Arrival date & time 09/15/15  1029 History   First MD Initiated Contact with Patient 09/15/15 1038     Chief Complaint  Patient presents with  . Nausea  . Generalized Body Aches     (Consider location/radiation/quality/duration/timing/severity/associated sxs/prior Treatment) HPI Comments: Patient here complaining of 48 hour history of watery diarrhea with emesis. She notes positive sick exposures. Denies any fever. No cough or congestion. Mild headache with some left eye visual changes which is similar to her migraines. Denies any photophobia. No neck stiffness. Has been unable to keep down her medications and is concerned that she may go into addisonian crisis. Called EMS and was given Benadryl and transported here  The history is provided by the patient.    Past Medical History  Diagnosis Date  . Hodgkin lymphoma (HCC)     STATUS POST MANTLE RADIATION  . Breast cancer ([place])     STATUS POST BILATERAL MASTECTOMY. STATUS POST RECONSTRUCTION. SHE HAD SILICONE BREAST IMPLANTS AND THE LEFT IMPLANT IS LEAKING SLIGHTLY  . Thyroid cancer ([place])     STATUS POST SURGICAL REMOVAL-CURRENT ON THYROID REPLACEMENT  . Adrenal insufficiency ([place])   . Tachycardia   . Palpitations   . Chest pain   . Hypertension   . Raynaud phenomenon   . Appendicitis 12/19/09  . GERD (gastroesophageal reflux disease)   . Aortic stenosis   . Aortic stenosis   . SVT (supraventricular tachycardia) ([place])   . Osteoporosis   . Addison's disease [facility])    Past Surgical History  Procedure Laterality Date  . Pituitary surgery    . Cardiac catheterization  05/18/09    NORMAL CATH  . Appendectomy    . Abdominal hysterectomy     Family History  Problem Relation Age of Onset  . Family history unknown: Yes   Social History  Substance Use Topics  . Smoking status: Never Smoker   . Smokeless tobacco: None  . Alcohol Use: Yes     Comment: occassionally   OB History    No data available      Review of Systems  All other systems reviewed and are negative.     Allergies  Cymbalta; Buprenorphine hcl; Compazine; Dilaudid; Morphine and related; Na ferric gluc cplx in sucrose; Promethazine hcl; Lorazepam; Metoclopramide hcl; Ondansetron; and Tegaderm ag mesh  Home Medications   Prior to Admission medications   Medication Sig Start Date End Date Taking? Authorizing Provider  amphetamine-dextroamphetamine (ADDERALL) 20 MG tablet Take 20 mg by mouth 2 (two) times daily.     Historical Provider, MD  clonazePAM (KLONOPIN) 0.5 MG tablet Take 0.5 mg by mouth 2 (two) times daily as needed. For anxiety.    Historical Provider, MD  dexlansoprazole (DEXILANT) 60 MG capsule Take 60 mg by mouth daily.      Historical Provider, MD  diazepam (VALIUM) 5 MG tablet Take 1 tablet (5 mg total) by mouth 2 (two) times daily. 05/20/15   Larene Pickett, PA-C  Diclofenac Potassium 50 MG PACK Take 1 application by mouth as needed. For migraines    Historical Provider, MD  hydrocortisone (CORTEF) 10 MG tablet Take 10-15 mg by mouth 2 (two) times daily.    Historical Provider, MD  levothyroxine (SYNTHROID, LEVOTHROID) 100 MCG tablet Take 100 mcg by mouth daily.      Historical Provider, MD  metoprolol succinate (TOPROL-XL) 25 MG 24 hr tablet TAKE 3 TABLETS BY MOUTH EVERY DAY WITH FOOD OR IMMEDIATELY FOLLOWING MEAL  09/05/15   Thayer Headings, MD  propranolol (INDERAL) 10 MG tablet Take 1 tablet (10 mg total) by mouth 4 (four) times daily as needed. 08/11/13   Thayer Headings, MD  rosuvastatin (CRESTOR) 10 MG tablet TAKE 1/2 TO 1 TABLET BY MOUTH EVERY DAY 08/18/15   Thayer Headings, MD  rosuvastatin (CRESTOR) 5 MG tablet Take 1-2 tablets (5-10 mg total) by mouth daily. 02/17/15   Thayer Headings, MD  sucralfate (CARAFATE) 1 G tablet Take 1 tablet (1 g total) by mouth 4 (four) times daily -  with meals and at bedtime. 05/20/15   Larene Pickett, PA-C  zolpidem (AMBIEN CR) 12.5 MG CR tablet Take 12.5 mg by mouth at  bedtime.      Historical Provider, MD   BP 118/78 mmHg  Pulse 85  Temp(Src) 98.1 F (36.7 C) (Oral)  Resp 17  SpO2 100% Physical Exam  Constitutional: She is oriented to person, place, and time. She appears well-developed and well-nourished.  Non-toxic appearance. No distress.  HENT:  Head: Normocephalic and atraumatic.  Eyes: Conjunctivae, EOM and lids are normal. Pupils are equal, round, and reactive to light.  Neck: Normal range of motion. Neck supple. No tracheal deviation present. No thyroid mass present.  Cardiovascular: Normal rate, regular rhythm and normal heart sounds.  Exam reveals no gallop.   No murmur heard. Pulmonary/Chest: Effort normal and breath sounds normal. No stridor. No respiratory distress. She has no decreased breath sounds. She has no wheezes. She has no rhonchi. She has no rales.  Abdominal: Soft. Normal appearance and bowel sounds are normal. She exhibits no distension. There is no tenderness. There is no rebound and no CVA tenderness.  Musculoskeletal: Normal range of motion. She exhibits no edema or tenderness.  Neurological: She is alert and oriented to person, place, and time. She has normal strength. No cranial nerve deficit or sensory deficit. GCS eye subscore is 4. GCS verbal subscore is 5. GCS motor subscore is 6.  Skin: Skin is warm and dry. No abrasion and no rash noted.  Psychiatric: She has a normal mood and affect. Her speech is normal and behavior is normal.  Nursing note and vitals reviewed.   ED Course  Procedures (including critical care time) Labs Review Labs Reviewed  CBC WITH DIFFERENTIAL/PLATELET  COMPREHENSIVE METABOLIC PANEL  URINALYSIS, ROUTINE W REFLEX MICROSCOPIC (NOT AT [facility])    Imaging Review No results found. I have personally reviewed and evaluated these images and lab results as part of my medical decision-making.   EKG Interpretation None      MDM   Final diagnoses:  None   Patient IV fluids and also  hydrocortisone. Patient's calcium noted and patient informed to follow-up with her Dr. for this. She no longer has emesis here. She feels better and will be discharged home     Lacretia Leigh, MD 09/15/15 [phone]

## 2015-09-15 NOTE — ED Notes (Addendum)
Patient is from home with headache, neck/back pain, non-traumatic. Patient also complains of nausea and vomiting that started 24 hours ago. She is unable to keep any fluids down. The body aches started last night and she has swelling to all four extremities which she feels is secondary to being unable to take her medications for addisons. Patient has breast cancer and is not currently being treated. Patient was given 5m of benadryl iv due to her multiple allergies to anti-emetics.

## 2015-09-15 NOTE — Discharge Instructions (Signed)
Viral Gastroenteritis Viral gastroenteritis is also known as stomach flu. This condition affects the stomach and intestinal tract. It can cause sudden diarrhea and vomiting. The illness typically lasts 3 to 8 days. Most people develop an immune response that eventually gets rid of the virus. While this natural response develops, the virus can make you quite ill. CAUSES  Many different viruses can cause gastroenteritis, such as rotavirus or noroviruses. You can catch one of these viruses by consuming contaminated food or water. You may also catch a virus by sharing utensils or other personal items with an infected person or by touching a contaminated surface. SYMPTOMS  The most common symptoms are diarrhea and vomiting. These problems can cause a severe loss of body fluids (dehydration) and a body salt (electrolyte) imbalance. Other symptoms may include:  Fever.  Headache.  Fatigue.  Abdominal pain. DIAGNOSIS  Your caregiver can usually diagnose viral gastroenteritis based on your symptoms and a physical exam. A stool sample may also be taken to test for the presence of viruses or other infections. TREATMENT  This illness typically goes away on its own. Treatments are aimed at rehydration. The most serious cases of viral gastroenteritis involve vomiting so severely that you are not able to keep fluids down. In these cases, fluids must be given through an intravenous line (IV). HOME CARE INSTRUCTIONS   Drink enough fluids to keep your urine clear or pale yellow. Drink small amounts of fluids frequently and increase the amounts as tolerated.  Ask your caregiver for specific rehydration instructions.  Avoid:  Foods high in sugar.  Alcohol.  Carbonated drinks.  Tobacco.  Juice.  Caffeine drinks.  Extremely hot or cold fluids.  Fatty, greasy foods.  Too much intake of anything at one time.  Dairy products until 24 to 48 hours after diarrhea stops.  You may consume probiotics.  Probiotics are active cultures of beneficial bacteria. They may lessen the amount and number of diarrheal stools in adults. Probiotics can be found in yogurt with active cultures and in supplements.  Wash your hands well to avoid spreading the virus.  Only take over-the-counter or prescription medicines for pain, discomfort, or fever as directed by your caregiver. Do not give aspirin to children. Antidiarrheal medicines are not recommended.  Ask your caregiver if you should continue to take your regular prescribed and over-the-counter medicines.  Keep all follow-up appointments as directed by your caregiver. SEEK IMMEDIATE MEDICAL CARE IF:   You are unable to keep fluids down.  You do not urinate at least once every 6 to 8 hours.  You develop shortness of breath.  You notice blood in your stool or vomit. This may look like coffee grounds.  You have abdominal pain that increases or is concentrated in one small area (localized).  You have persistent vomiting or diarrhea.  You have a fever.  The patient is a child younger than 3 months, and he or she has a fever.  The patient is a child older than 3 months, and he or she has a fever and persistent symptoms.  The patient is a child older than 3 months, and he or she has a fever and symptoms suddenly get worse.  The patient is a baby, and he or she has no tears when crying. MAKE SURE YOU:   Understand these instructions.  Will watch your condition.  Will get help right away if you are not doing well or get worse.   This information is not intended to replace  advice given to you by your health care provider. Make sure you discuss any questions you have with your health care provider.   Document Released: 09/17/2005 Document Revised: 12/10/2011 Document Reviewed: 07/04/2011 Elsevier Interactive Patient Education [employer].

## 2015-11-30 ENCOUNTER — Ambulatory Visit: Payer: PRIVATE HEALTH INSURANCE | Admitting: Pharmacist

## 2016-03-05 ENCOUNTER — Encounter (HOSPITAL_COMMUNITY): Payer: Self-pay | Admitting: Emergency Medicine

## 2016-03-05 ENCOUNTER — Emergency Department (HOSPITAL_COMMUNITY)
Admission: EM | Admit: 2016-03-05 | Discharge: 2016-03-05 | Disposition: A | Discharge status: Home / Self Care | Payer: PRIVATE HEALTH INSURANCE | Attending: Emergency Medicine | Admitting: Emergency Medicine

## 2016-03-05 DIAGNOSIS — Z85850 Personal history of malignant neoplasm of thyroid: Secondary | ICD-10-CM | POA: Diagnosis not present

## 2016-03-05 DIAGNOSIS — R112 Nausea with vomiting, unspecified: Secondary | ICD-10-CM | POA: Diagnosis present

## 2016-03-05 DIAGNOSIS — Z8571 Personal history of Hodgkin lymphoma: Secondary | ICD-10-CM | POA: Insufficient documentation

## 2016-03-05 DIAGNOSIS — Z853 Personal history of malignant neoplasm of breast: Secondary | ICD-10-CM | POA: Insufficient documentation

## 2016-03-05 DIAGNOSIS — Z79899 Other long term (current) drug therapy: Secondary | ICD-10-CM | POA: Diagnosis not present

## 2016-03-05 DIAGNOSIS — A084 Viral intestinal infection, unspecified: Secondary | ICD-10-CM

## 2016-03-05 DIAGNOSIS — I10 Essential (primary) hypertension: Secondary | ICD-10-CM | POA: Insufficient documentation

## 2016-03-05 LAB — URINALYSIS, ROUTINE W REFLEX MICROSCOPIC
BILIRUBIN URINE: NEGATIVE
Glucose, UA: NEGATIVE mg/dL
Hgb urine dipstick: NEGATIVE
KETONES UR: NEGATIVE mg/dL
LEUKOCYTES UA: NEGATIVE
NITRITE: NEGATIVE
Protein, ur: NEGATIVE mg/dL
SPECIFIC GRAVITY, URINE: 1.008 (ref 1.005–1.030)
pH: 6 (ref 5.0–8.0)

## 2016-03-05 LAB — COMPREHENSIVE METABOLIC PANEL
ALT: 29 U/L (ref 14–54)
ANION GAP: 6 (ref 5–15)
AST: 36 U/L (ref 15–41)
Albumin: 3.8 g/dL (ref 3.5–5.0)
Alkaline Phosphatase: 38 U/L (ref 38–126)
BUN: 13 mg/dL (ref 6–20)
CALCIUM: 8.2 mg/dL — AB (ref 8.9–10.3)
CHLORIDE: 106 mmol/L (ref 101–111)
CO2: 24 mmol/L (ref 22–32)
CREATININE: 0.74 mg/dL (ref 0.44–1.00)
Glucose, Bld: 101 mg/dL — ABNORMAL HIGH (ref 65–99)
Potassium: 4.5 mmol/L (ref 3.5–5.1)
SODIUM: 136 mmol/L (ref 135–145)
Total Bilirubin: 0.8 mg/dL (ref 0.3–1.2)
Total Protein: 7 g/dL (ref 6.5–8.1)

## 2016-03-05 LAB — CBC WITH DIFFERENTIAL/PLATELET
BASOS PCT: 0 %
Basophils Absolute: 0 10*3/uL (ref 0.0–0.1)
EOS ABS: 0 10*3/uL (ref 0.0–0.7)
Eosinophils Relative: 0 %
HCT: 37.3 % (ref 36.0–46.0)
HEMOGLOBIN: 12.1 g/dL (ref 12.0–15.0)
Lymphocytes Relative: 4 %
Lymphs Abs: 0.3 10*3/uL — ABNORMAL LOW (ref 0.7–4.0)
MCH: 27.8 pg (ref 26.0–34.0)
MCHC: 32.4 g/dL (ref 30.0–36.0)
MCV: 85.7 fL (ref 78.0–100.0)
Monocytes Absolute: 0.3 10*3/uL (ref 0.1–1.0)
Monocytes Relative: 4 %
NEUTROS ABS: 6.9 10*3/uL (ref 1.7–7.7)
NEUTROS PCT: 92 %
Platelets: 260 10*3/uL (ref 150–400)
RBC: 4.35 MIL/uL (ref 3.87–5.11)
RDW: 13.7 % (ref 11.5–15.5)
WBC: 7.5 10*3/uL (ref 4.0–10.5)

## 2016-03-05 LAB — MAGNESIUM: MAGNESIUM: 1.9 mg/dL (ref 1.7–2.4)

## 2016-03-05 MED ORDER — FENTANYL CITRATE (PF) 100 MCG/2ML IJ SOLN
25.0000 ug | Freq: Once | INTRAMUSCULAR | Status: AC
  Administered 2016-03-05: 25 ug via INTRAVENOUS
  Filled 2016-03-05: qty 2

## 2016-03-05 MED ORDER — SODIUM CHLORIDE 0.9 % IV BOLUS (SEPSIS)
1000.0000 mL | Freq: Once | INTRAVENOUS | Status: AC
  Administered 2016-03-05: 1000 mL via INTRAVENOUS

## 2016-03-05 MED ORDER — HYDROCORTISONE NA SUCCINATE PF 100 MG IJ SOLR
100.0000 mg | Freq: Once | INTRAMUSCULAR | Status: AC
  Administered 2016-03-05: 100 mg via INTRAVENOUS
  Filled 2016-03-05: qty 2

## 2016-03-05 MED ORDER — DIPHENHYDRAMINE HCL 50 MG/ML IJ SOLN
25.0000 mg | Freq: Once | INTRAMUSCULAR | Status: AC
  Administered 2016-03-05: 25 mg via INTRAVENOUS
  Filled 2016-03-05: qty 1

## 2016-03-05 NOTE — Discharge Instructions (Signed)
Please read and follow all provided instructions.  Your diagnoses today include:  1. Viral gastroenteritis    Tests performed today include:  Vital signs. See below for your results today.   Medications prescribed:   Take as prescribed   Home care instructions:  Follow any educational materials contained in this packet.  Follow-up instructions: Please follow-up with your primary care provider for further evaluation of symptoms and treatment   Return instructions:   Please return to the Emergency Department if you do not get better, if you get worse, or new symptoms OR  - Fever (temperature greater than 101.55F)  - Bleeding that does not stop with holding pressure to the area    -Severe pain (please note that you may be more sore the day after your accident)  - Chest Pain  - Difficulty breathing  - Severe nausea or vomiting  - Inability to tolerate food and liquids  - Passing out  - Skin becoming red around your wounds  - Change in mental status (confusion or lethargy)  - New numbness or weakness     Please return if you have any other emergent concerns.  Additional Information:  Your vital signs today were: BP 156/97 mmHg   Pulse 93   Temp(Src) 98.6 F (37 C) (Oral)   Resp 16   SpO2 100% If your blood pressure (BP) was elevated above 135/85 this visit, please have this repeated by your doctor within one month. ---------------

## 2016-03-05 NOTE — ED Notes (Signed)
Bed: HU31 Expected date:  Expected time:  Means of arrival:  Comments: EMS- 45yo F, [place] Pt, n/v/d x 2 days

## 2016-03-05 NOTE — ED Provider Notes (Signed)
CSN: [id number]     Arrival date & time 03/05/16  1549 History   First MD Initiated Contact with Patient 03/05/16 1554     Chief Complaint  Patient presents with  . Emesis  . Diarrhea   (Consider location/radiation/quality/duration/timing/severity/associated sxs/prior Treatment) HPI 45 y.o. female with a hx of Hodgkin's disease s/p mantle radiation, breast, ovarian, and cervical cancer, Addison's Disease, Osteoporosis, presents to the Emergency Department today complaining of N/V/D x 2 days. Notes positive sick contacts at the beach. Reports similar history of the same in December. Diagnosed with Viral Gastroenteritis at that time. Notes generalized pain around body with pain score 2/10. OTC without relief. Pt notified EMS today due to fall after her muscles were cramping "all over." Does note some mild left shoulder pain. Pt does have significant history of metastatic cancer. Has been unable to keep down her steroid medication for Addison's and is worried about Crisis. Pt seeing [facility] team on Wednesday for follow up CT for mets and treatment options due to elevated markers on breast cancer. Pt on chronic 3L  O2. No fevers. No cough/congestion. No other symptoms noted.    Past Medical History  Diagnosis Date  . Hodgkin lymphoma (HCC)     STATUS POST MANTLE RADIATION  . Breast cancer ([place])     STATUS POST BILATERAL MASTECTOMY. STATUS POST RECONSTRUCTION. SHE HAD SILICONE BREAST IMPLANTS AND THE LEFT IMPLANT IS LEAKING SLIGHTLY  . Thyroid cancer ([place])     STATUS POST SURGICAL REMOVAL-CURRENT ON THYROID REPLACEMENT  . Adrenal insufficiency ([place])   . Tachycardia   . Palpitations   . Chest pain   . Hypertension   . Raynaud phenomenon   . Appendicitis 12/19/09  . GERD (gastroesophageal reflux disease)   . Aortic stenosis   . Aortic stenosis   . SVT (supraventricular tachycardia) ([place])   . Osteoporosis   . Addison's disease [facility])    Past Surgical History  Procedure Laterality Date  .  Pituitary surgery    . Cardiac catheterization  05/18/09    NORMAL CATH  . Appendectomy    . Abdominal hysterectomy     Family History  Problem Relation Age of Onset  . Family history unknown: Yes   Social History  Substance Use Topics  . Smoking status: Never Smoker   . Smokeless tobacco: None  . Alcohol Use: Yes     Comment: occassionally   OB History    No data available     Review of Systems ROS reviewed and all are negative for acute change except as noted in the HPI.  Allergies  Cymbalta; Buprenorphine hcl; Compazine; Dilaudid; Morphine and related; Na ferric gluc cplx in sucrose; Promethazine hcl; Lorazepam; Metoclopramide hcl; Ondansetron; and Tegaderm ag mesh  Home Medications   Prior to Admission medications   Medication Sig Start Date End Date Taking? Authorizing Provider  amphetamine-dextroamphetamine (ADDERALL) 20 MG tablet Take 10-20 mg by mouth 2 (two) times daily. Takes 70m in the morning and 119min the evening    Historical Provider, MD  clonazePAM (KLONOPIN) 1 MG tablet Take 1 mg by mouth 3 (three) times daily as needed for anxiety.    Historical Provider, MD  dexlansoprazole (DEXILANT) 60 MG capsule Take 60 mg by mouth daily.      Historical Provider, MD  diazepam (VALIUM) 5 MG tablet Take 1 tablet (5 mg total) by mouth 2 (two) times daily. Patient not taking: Reported on 09/15/2015 05/20/15   LiLarene PickettPA-C  Diclofenac Potassium  50 MG PACK Take 1 application by mouth as needed. For migraines    Historical Provider, MD  escitalopram (LEXAPRO) 10 MG tablet Take 15 mg by mouth daily.    Historical Provider, MD  estradiol (ESTRACE) 1 MG tablet Take 1 mg by mouth daily.    Historical Provider, MD  hydrocortisone (CORTEF) 10 MG tablet Take 5-10 mg by mouth 2 (two) times daily. Takes 66m in the morning and 566min the evening    Historical Provider, MD  ibuprofen (ADVIL,MOTRIN) 200 MG tablet Take 600 mg by mouth every 6 (six) hours as needed for moderate  pain.    Historical Provider, MD  levothyroxine (SYNTHROID, LEVOTHROID) 100 MCG tablet Take 100 mcg by mouth daily.      Historical Provider, MD  metoprolol succinate (TOPROL-XL) 25 MG 24 hr tablet TAKE 3 TABLETS BY MOUTH EVERY DAY WITH FOOD OR IMMEDIATELY FOLLOWING MEAL Patient taking differently: TAKE 2 TABLETS BY MOUTH EVERY DAY WITH FOOD OR IMMEDIATELY FOLLOWING MEAL 09/05/15   PhThayer HeadingsMD  propranolol (INDERAL) 10 MG tablet Take 1 tablet (10 mg total) by mouth 4 (four) times daily as needed. Patient not taking: Reported on 09/15/2015 08/11/13   PhThayer HeadingsMD  rosuvastatin (CRESTOR) 10 MG tablet TAKE 1/2 TO 1 TABLET BY MOUTH EVERY DAY Patient taking differently: Take 1044my mouth every other day 08/18/15   PhiThayer HeadingsD  rosuvastatin (CRESTOR) 5 MG tablet Take 1-2 tablets (5-10 mg total) by mouth daily. Patient not taking: Reported on 09/15/2015 02/17/15   PhiThayer HeadingsD  sucralfate (CARAFATE) 1 G tablet Take 1 tablet (1 g total) by mouth 4 (four) times daily -  with meals and at bedtime. Patient not taking: Reported on 09/15/2015 05/20/15   LisLarene PickettA-C  zolpidem (AMBIEN CR) 12.5 MG CR tablet Take 12.5 mg by mouth at bedtime.      Historical Provider, MD   BP 156/97 mmHg  Pulse 93  Temp(Src) 98.6 F (37 C) (Oral)  Resp 16  SpO2 100%   Physical Exam  Constitutional: She is oriented to person, place, and time. She appears well-developed and well-nourished.  HENT:  Head: Normocephalic and atraumatic.  Negative [place]  Eyes: EOM are normal. Pupils are equal, round, and reactive to light.  Neck: Normal range of motion. Neck supple. No tracheal deviation present.  Cardiovascular: Normal rate, regular rhythm, normal heart sounds and intact distal pulses.   No murmur heard. Pulmonary/Chest: Effort normal and breath sounds normal. No respiratory distress. She has no wheezes. She has no rales. She exhibits no tenderness.  Abdominal: Soft. Normal appearance  and bowel sounds are normal. There is no tenderness. There is no rigidity, no rebound, no guarding, no tenderness at McBurney's point and negative Murphy's sign.  Musculoskeletal: Normal range of motion.  Neg Trousseau's sign   Neurological: She is alert and oriented to person, place, and time. She has normal strength. No cranial nerve deficit or sensory deficit.  Negative Pronator Drift. Cranial Nerves:  II: Pupils equal, round, reactive to light III,IV, VI: ptosis not present, extra-ocular motions intact bilaterally  V,VII: smile symmetric, facial light touch sensation equal VIII: hearing grossly normal bilaterally  IX,X: midline uvula rise  XI: bilateral shoulder shrug equal and strong XII: midline tongue extension  Skin: Skin is warm and dry.  Psychiatric: She has a normal mood and affect. Her behavior is normal. Thought content normal.  Nursing note and vitals reviewed.  ED Course  Procedures (including critical care time) Labs Review Labs Reviewed  COMPREHENSIVE METABOLIC PANEL - Abnormal; Notable for the following:    Glucose, Bld 101 (*)    Calcium 8.2 (*)    All other components within normal limits  URINALYSIS, ROUTINE W REFLEX MICROSCOPIC (NOT AT [facility]) - Abnormal; Notable for the following:    APPearance CLOUDY (*)    All other components within normal limits  CBC WITH DIFFERENTIAL/PLATELET - Abnormal; Notable for the following:    Lymphs Abs 0.3 (*)    All other components within normal limits  MAGNESIUM   Imaging Review No results found. I have personally reviewed and evaluated these images and lab results as part of my medical decision-making.   EKG Interpretation None      MDM  I have reviewed and evaluated the relevant laboratory values I have reviewed and evaluated the relevant imaging studies.  I have reviewed the relevant previous healthcare records. I have reviewed EMS Documentation. I obtained HPI from historian. Patient discussed with supervising  physician  ED Course:  Assessment: Pt is a 70yF with a hx of Hodgkin's disease s/p mantle radiation, breast, ovarian, and cervical cancer, Addison's Disease, Osteoporosis, who presents with N/V/D x 2 days. Hx of the same in December. Notes sick contacts at the beach recently. On exam, pt in NAD. Nontoxic/nonseptic appearing. VSS. Afebrile. Lungs CTA. Heart RRR. Abdomen nontender soft. CN evaluated and unremarkable. Labs unremarkable. Given NS bolus, fentanyl, benadryl, hydrocortisone 113m in ED. Pt has close follow up with [facility] this week for treatment of possible mets into lungs from breast cancer. Plan is to DC home with follow up to PCP. At time of discharge, Patient is in no acute distress. Vital Signs are stable. Patient is able to ambulate. Patient able to tolerate PO. No emesis in ED.   6:33 PM- Significant improvement of symptoms with fluids and steroids. Pt comfortable and agreeable with discharge.   Disposition/Plan:  DC Home Additional Verbal discharge instructions given and discussed with patient.  Pt Instructed to f/u with PCP in the next week for evaluation and treatment of symptoms. Return precautions given Pt acknowledges and agrees with plan  Supervising Physician MCharlesetta Shanks MD   Final diagnoses:  Viral gastroenteritis    TShary Decamp PA-C 03/05/16 1833  MCharlesetta Shanks MD 03/18/16 [phone]

## 2016-03-05 NOTE — ED Notes (Signed)
Per EMS, pt from home with N/V/D x2 days.  Pt reports dizziness and fall today. Hx cancer and Addison's disease. A&Ox4.

## 2016-06-15 ENCOUNTER — Encounter: Payer: Self-pay | Admitting: Nurse Practitioner

## 2016-06-20 ENCOUNTER — Other Ambulatory Visit: Payer: Self-pay | Admitting: Cardiovascular Disease

## 2016-07-04 ENCOUNTER — Ambulatory Visit: Payer: Self-pay | Admitting: Cardiovascular Disease

## 2016-07-05 ENCOUNTER — Ambulatory Visit (INDEPENDENT_AMBULATORY_CARE_PROVIDER_SITE_OTHER): Payer: PRIVATE HEALTH INSURANCE | Admitting: Cardiovascular Disease

## 2016-07-05 ENCOUNTER — Encounter: Payer: Self-pay | Admitting: Cardiovascular Disease

## 2016-07-05 VITALS — BP 140/90 | HR 87 | Ht 65.0 in

## 2016-07-05 DIAGNOSIS — R002 Palpitations: Secondary | ICD-10-CM | POA: Diagnosis not present

## 2016-07-05 DIAGNOSIS — I10 Essential (primary) hypertension: Secondary | ICD-10-CM | POA: Diagnosis not present

## 2016-07-05 MED ORDER — PROPRANOLOL HCL 10 MG PO TABS: 10.0000 mg | ORAL_TABLET | Freq: Four times a day (QID) | ORAL | 3 refills | Status: DC | PRN

## 2016-07-05 NOTE — Patient Instructions (Signed)
Medication Instructions:  Your physician recommends that you continue on your current medications as directed. Please refer to the Current Medication list given to you today.   Labwork: None Ordered   Testing/Procedures: None Ordered   Follow-Up: Your physician wants you to follow-up in: 6 months with Dr. Acie Fredrickson.  You will receive a reminder letter in the mail two months in advance. If you don't receive a letter, please call our office to schedule the follow-up appointment.   If you need a refill on your cardiac medications before your next appointment, please call your pharmacy.   Thank you for choosing [facility]! Christen Bame, RN [phone]

## 2016-07-05 NOTE — Progress Notes (Signed)
Cardiology Office Note   Date:  07/05/2016   ID:  [name], DOB [birth year], MRN [id number]  PCP:  Maylon Peppers, MD  Cardiologist:   Mertie Moores, MD   No chief complaint on file.  Problem List:  1. Palpitations/premature ventricular contractions 2. history of Hodgkin's lymphoma-status post mantle radiation 3. History of breast cancer-status post Reconstruction 4. History of cervical cancer 5. 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 2015 for regrowth of tumor.  6. History of thyroidectomy - hx of thyroid cancer  7. Raynaud's phenomenon 8. Appendectomy 9. History chest pains-normal heart catheterization, normal TEE 10. history of adrenal insufficiency  11. Hyperlipidemia   History of Present Illness:  [name] is done very well since I last saw her about a year ago. She has had some occasional palpitations that are typically controlled with metoprolol. She's not had any episodes of chest pain. She's been able to stop all of her steroids. Her adrenal glands have gradually improved and her now producing cortisol.  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 She is still working at [employer].   Feb 24, 2013:  She has done well since I last saw her . She has had her breast implants replaced ( old ones were leaking). Her costochondritis has resolved. She has not been lifting.   She has run in 6 5K races this year. She is playing tennis. She is still in [employer] school. She is doing her orthopedic rotation.   Nov. 11, 2014:  Her HR has been high. She has a head ache frequently. She started Adderall recently.  The adderall has help with her focus but she feels much worse in it. She has been on the Adderall for 2 months and the tachycardia started about 1 week ago.  She was doing cycle classes 3 times a week. She is exercising regularly  and for the past week, her HR is extremely fast. She has gone into menapause and has lots of hot flashes.   Feb. 23, 2015:  She has been having some recent episodes of tachycardia. We increased her metoprolol at the last visit. We performed an echocardiogram which revealed  Left ventricle: The cavity size was normal. Systolic function was normal. The estimated ejection fraction was in the range of 55% to 60%. Wall motion was normal; there were no regional wall motion abnormalities. Left ventricular diastolic function parameters were normal. - Aortic valve: Trivial regurgitation. - Mitral valve: Trivial regurgitation  She tried to stop her Adderall ( did not do well with that). She decreased her dose slightly and she is not having much tachycardia. She has been   She has been found to have some osteoperosis and osteopenia.   March 09, 2014:  [name] is doing ok.. No recent cardiac problems. She has been very active and is feeling great. Playing tennis on a regular basis.     Feb. 17, 2016:   [name] is a 45 y.o. female who presents for for evaluation of palpitations , hypotension. Echo yesterday  Left ventricle: The cavity size was normal. Systolic function was normal. The estimated ejection fraction was in the range of 55% to 60%. Wall motion was normal; there were no regional wall motion abnormalities. - Aortic valve: There was trivial regurgitation. - Mitral valve: Calcified annulus.  Labs were ordered yesterday but she was not able to get them.  Continues to have palpitations today She is off her Estrogen at this point.  She is back on the Toprol - skipped 2 days.  Has been taking the PRN propranolol  Not feeling well and has not been eating or drinking well. We given 1 dose of lasix   December 15, 2014:  She has continued to have palpitations  - last 20-30 seconds .  HR has remained fairly high. Has been working out regularly .  These are going ok.    Feb 03, 2015: [name] continues to have palpitations . She wore a monitor but actually only wore is for about 1/2 of the time ( she was at [employer]) We have discussed implantable loop.  I have some concerns about doing any procedures on her .  She has only been taking the toprol 50 mg a day instead of 75.   May 17, 2015:  [name] is seen today for follow up. She has a history of hyperlipidemia and we sent her to lipid clinic. She is tolerating the Crestor very well.   Still having palpitations but learning how to manage .  Oct. 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 Has been seen at [facility] and is now at [facility]  She has sent scans to [facility]. Is on steroids to shrink tumor, has gained lots of weight.  Is hoping to be able to get a bx so that she have this tumor treated.   She is now on home O2.  O2 sats dropped to 78 while sleeping on room air.    O2 sats on 2.5 liters are good. She uses 4 lpm at night .   Is having more palpitations. Would like a refill for propranolol'    Past Medical History:  Diagnosis Date  . Addison's disease ([place])   . Adrenal insufficiency ([place])   . Aortic stenosis   . Aortic stenosis   . Appendicitis 12/19/09  . Breast cancer ([place])    STATUS POST BILATERAL MASTECTOMY. STATUS POST RECONSTRUCTION. SHE HAD SILICONE BREAST IMPLANTS AND THE LEFT IMPLANT IS LEAKING SLIGHTLY  . Chest pain   . GERD (gastroesophageal reflux disease)   . Hodgkin lymphoma (HCC)    STATUS POST MANTLE RADIATION  . Hypertension   . Osteoporosis   . Palpitations   . Raynaud phenomenon   . SVT (supraventricular tachycardia) ([place])   . Tachycardia   . Thyroid cancer ([place])    STATUS POST SURGICAL REMOVAL-CURRENT ON THYROID REPLACEMENT    Past Surgical History:  Procedure Laterality Date  . ABDOMINAL HYSTERECTOMY    . APPENDECTOMY    . CARDIAC CATHETERIZATION  05/18/09    NORMAL CATH  . PITUITARY SURGERY       Current Outpatient Prescriptions  Medication Sig Dispense Refill  . albuterol (VENTOLIN HFA) 108 (90 Base) MCG/ACT inhaler Inhale 2 puffs into the lungs every 6 (six) hours as needed for wheezing or shortness of breath.    . amphetamine-dextroamphetamine (ADDERALL XR) 25 MG 24 hr capsule Take 25 mg by mouth every morning.    . beclomethasone (QVAR) 80 MCG/ACT inhaler Inhale 2 puffs into the lungs 2 (two) times daily.    Marland Kitchen BREO ELLIPTA 200-25 MCG/INH AEPB INHALE 1 INHALATION INTO THE LUNGS ONCE DAILY.  11  . clonazePAM (KLONOPIN) 1 MG tablet Take 1 mg by mouth 2 (two) times daily.     Marland Kitchen  dexlansoprazole (DEXILANT) 60 MG capsule Take 60 mg by mouth daily.      . diazepam (VALIUM) 5 MG tablet Take 1 tablet (5 mg total) by mouth 2 (two) times daily. (Patient not taking: Reported on 09/15/2015) 14 tablet 0  . Diclofenac Potassium 50 MG PACK Take 1 application by mouth daily as needed (migraines). For migraines    . escitalopram (LEXAPRO) 10 MG tablet Take 15 mg by mouth daily.    Marland Kitchen estradiol (ESTRACE) 2 MG tablet Take 2 mg by mouth daily.  11  . fluticasone (FLONASE) 50 MCG/ACT nasal spray Place 2 sprays into both nostrils daily.    . hydrocortisone (CORTEF) 10 MG tablet Take 10 mg by mouth 2 (two) times daily.     Marland Kitchen ibuprofen (ADVIL,MOTRIN) 200 MG tablet Take 600 mg by mouth every 6 (six) hours as needed for moderate pain.    Marland Kitchen levothyroxine (SYNTHROID, LEVOTHROID) 100 MCG tablet Take 100 mcg by mouth daily.      . metoprolol succinate (TOPROL-XL) 25 MG 24 hr tablet TAKE 3 TABLETS BY MOUTH EVERY DAY WITH FOOD OR IMMEDIATELY FOLLOWING MEAL (Patient taking differently: TAKE 2 TABLETS BY MOUTH EVERY DAY WITH FOOD OR IMMEDIATELY FOLLOWING MEAL) 270 tablet 0  . propranolol (INDERAL) 10 MG tablet Take 1 tablet (10 mg total) by mouth 4 (four) times daily as needed. (Patient taking differently: Take 10 mg by mouth 4 (four) times daily as needed. HBP) 50 tablet 3  .  rosuvastatin (CRESTOR) 10 MG tablet Take 0.5 - 1 tablet by mouth daily. OVERDUE FOR FOLLOW UP. CALL AND SCHEDULE [phone] - 1st attempt 30 tablet 0  . rosuvastatin (CRESTOR) 5 MG tablet Take 1-2 tablets (5-10 mg total) by mouth daily. (Patient not taking: Reported on 09/15/2015) 60 tablet 3  . sucralfate (CARAFATE) 1 G tablet Take 1 tablet (1 g total) by mouth 4 (four) times daily -  with meals and at bedtime. (Patient not taking: Reported on 09/15/2015) 20 tablet 0  . zolpidem (AMBIEN CR) 12.5 MG CR tablet Take 12.5 mg by mouth at bedtime.       No current facility-administered medications for this visit.     Allergies:   Cymbalta [duloxetine hcl]; Buprenorphine hcl; Compazine; Dilaudid [hydromorphone hcl]; Hydromorphone; Metoclopramide; Morphine and related; Na ferric gluc cplx in sucrose; Promethazine hcl; Lorazepam; Metoclopramide hcl; Ondansetron; and Tegaderm ag mesh [silver]    Social History:  The patient  reports that she has never smoked. She does not have any smokeless tobacco history on file. She reports that she drinks alcohol. She reports that she does not use drugs.   Family History:  The patient's Family history is unknown by patient.    ROS:  Please see the history of present illness.    Review of Systems: Constitutional:  denies fever, chills, diaphoresis, appetite change and fatigue.  HEENT: denies photophobia, eye pain, redness, hearing loss, ear pain, congestion, sore throat, rhinorrhea, sneezing, neck pain, neck stiffness and tinnitus.  Respiratory: denies SOB, DOE, cough, chest tightness, and wheezing.  Cardiovascular: admits to chest pain, palpitations and leg and hand swelling.  Gastrointestinal: denies nausea, vomiting, abdominal pain, diarrhea, constipation, blood in stool.  Genitourinary: denies dysuria, urgency, frequency, hematuria, flank pain and difficulty urinating.  Musculoskeletal: denies  myalgias, back pain, joint swelling, arthralgias and gait  problem.   Skin: denies pallor, rash and wound.  Neurological: denies dizziness, seizures, syncope, weakness, light-headedness, numbness and headaches.   Hematological: denies adenopathy, easy bruising, personal or family bleeding  history.  Psychiatric/ Behavioral: denies suicidal ideation, mood changes, confusion, nervousness, sleep disturbance and agitation.       All other systems are reviewed and negative.    PHYSICAL EXAM: VS:  Ht _0  (1.651 m)  , BMI There is no height or weight on file to calculate BMI. GEN: Well nourished, well developed, in no acute distress  HEENT: normal  Neck: no JVD, carotid bruits, or masses Cardiac: RRR;  2/6 systolic murmur,  No  rubs, or gallops,no edema  Respiratory:  clear to auscultation bilaterally, normal work of breathing GI: soft, nontender, nondistended, + BS MS: no deformity or atrophy  Skin: warm and dry, no rash Neuro:  Strength and sensation are intact Psych: normal   EKG:  EKG is ordered today. The ekg ordered today demonstrates NSR at 87. Normal ECG    Recent Labs: 03/05/2016: ALT 29; BUN 13; Creatinine, Ser 0.74; Hemoglobin 12.1; Magnesium 1.9; Platelets 260; Potassium 4.5; Sodium 136    Lipid Panel    Component Value Date/Time   CHOL 184 05/16/2015 0933   CHOL 207 (H) 12/08/2013 0823   TRIG 107.0 05/16/2015 0933   TRIG 96 12/08/2013 0823   HDL 63.00 05/16/2015 0933   HDL 57 12/08/2013 0823   CHOLHDL 3 05/16/2015 0933   VLDL 21.4 05/16/2015 0933   LDLCALC 100 (H) 05/16/2015 0933   LDLCALC 131 (H) 12/08/2013 0823      Wt Readings from Last 3 Encounters:  06/16/15 146 lb (66.2 kg)  05/20/15 139 lb (63 kg)  05/17/15 140 lb (63.5 kg)      Other studies Reviewed: Additional studies/ records that were reviewed today include: . Review of the above records demonstrates:    ASSESSMENT AND PLAN:  1. Palpitations/premature ventricular contractions -  Will refill her propranolol  Continue metoprolol    2.  history of Hodgkin's lymphoma-status post mantle radiation 3. History of breast cancer-status post Reconstruction Has recurrent breast cancer.   With mets to lungs, liver and possibly brain . Is going to [facility] for further treatments.    4. History of cervical cancer 5. 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 2015 for regrowth of tumor.  6. History of thyroidectomy - hx of thyroid cancer  7. Raynaud's phenomenon 8. Appendectomy 9. History chest pains-normal heart catheterization, normal TEE,,  She's having intermittent episodes of chest palpitations and chest pains. These chest pains are noncardiac. 10. history of adrenal insufficiency  11.  Palpitations:    I'll see her again in the office in 6  months.  Current medicines are reviewed at length with the patient today.  The patient does not have concerns regarding medicines.  The following changes have been made:  no change  Disposition:   FU with me in me in 6  months.    Signed, Mertie Moores, MD  07/05/2016 4:29 PM    [address], [place], [place]  [address] Phone: [phone]; Fax: [phone]

## 2016-07-18 ENCOUNTER — Encounter: Payer: Self-pay | Admitting: Nurse Practitioner

## 2016-08-20 ENCOUNTER — Other Ambulatory Visit: Payer: Self-pay | Admitting: Cardiovascular Disease

## 2017-03-18 ENCOUNTER — Ambulatory Visit: Payer: Self-pay | Admitting: Cardiovascular Disease

## 2017-03-18 ENCOUNTER — Encounter: Payer: Self-pay | Admitting: Cardiovascular Disease

## 2017-04-16 ENCOUNTER — Other Ambulatory Visit: Payer: Self-pay | Admitting: Cardiovascular Disease

## 2017-04-17 ENCOUNTER — Ambulatory Visit (INDEPENDENT_AMBULATORY_CARE_PROVIDER_SITE_OTHER): Payer: PRIVATE HEALTH INSURANCE | Admitting: Cardiovascular Disease

## 2017-04-17 ENCOUNTER — Encounter: Payer: Self-pay | Admitting: Cardiovascular Disease

## 2017-04-17 VITALS — BP 138/98 | HR 118 | Ht 65.5 in | Wt 173.0 lb

## 2017-04-17 DIAGNOSIS — I50810 Right heart failure, unspecified: Secondary | ICD-10-CM | POA: Diagnosis not present

## 2017-04-17 HISTORY — DX: Right heart failure, unspecified: I50.810

## 2017-04-17 MED ORDER — POTASSIUM CHLORIDE ER 10 MEQ PO TBCR: 10.0000 meq | EXTENDED_RELEASE_TABLET | Freq: Every day | ORAL | 3 refills | Status: DC

## 2017-04-17 NOTE — Progress Notes (Signed)
Cardiology Office Note   Date:  04/17/2017   ID:  [name], DOB 1971/08/05, MRN [id number]  PCP:  Maylon Peppers, MD  Cardiologist:   Mertie Moores, MD   Chief Complaint  Patient presents with  . Hypertension   Problem List:  1. Palpitations/premature ventricular contractions 2. history of Hodgkin's lymphoma-status post mantle radiation 3. History of breast cancer-status post Reconstruction 4. History of cervical cancer 5. 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 2015 for regrowth of tumor.  6. History of thyroidectomy - hx of thyroid cancer  7. Raynaud's phenomenon 8. Appendectomy 9. History chest pains-normal heart catheterization, normal TEE 10. history of adrenal insufficiency  11. Hyperlipidemia   History of Present Illness:  [name] is done very well since I last saw her about a year ago. She has had some occasional palpitations that are typically controlled with metoprolol. She's not had any episodes of chest pain. She's been able to stop all of her steroids. Her adrenal glands have gradually improved and her now producing cortisol.  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 She is still working at [employer].   Feb 24, 2013:  She has done well since I last saw her . She has had her breast implants replaced ( old ones were leaking). Her costochondritis has resolved. She has not been lifting.   She has run in 6 5K races this year. She is playing tennis. She is still in [employer] school. She is doing her orthopedic rotation.   Nov. 11, 2014:  Her HR has been high. She has a head ache frequently. She started Adderall recently.  The adderall has help with her focus but she feels much worse in it. She has been on the Adderall for 2 months and the tachycardia started about 1 week ago.  She was doing cycle classes 3 times a  week. She is exercising regularly and for the past week, her HR is extremely fast. She has gone into menapause and has lots of hot flashes.   Feb. 23, 2015:  She has been having some recent episodes of tachycardia. We increased her metoprolol at the last visit. We performed an echocardiogram which revealed  Left ventricle: The cavity size was normal. Systolic function was normal. The estimated ejection fraction was in the range of 55% to 60%. Wall motion was normal; there were no regional wall motion abnormalities. Left ventricular diastolic function parameters were normal. - Aortic valve: Trivial regurgitation. - Mitral valve: Trivial regurgitation  She tried to stop her Adderall ( did not do well with that). She decreased her dose slightly and she is not having much tachycardia. She has been   She has been found to have some osteoperosis and osteopenia.   March 09, 2014:  [name] is doing ok.. No recent cardiac problems. She has been very active and is feeling great. Playing tennis on a regular basis.     Feb. 17, 2016:   [name] is a 46 y.o. female who presents for for evaluation of palpitations , hypotension. Echo yesterday  Left ventricle: The cavity size was normal. Systolic function was normal. The estimated ejection fraction was in the range of 55% to 60%. Wall motion was normal; there were no regional wall motion abnormalities. - Aortic valve: There was trivial regurgitation. - Mitral valve: Calcified annulus.  Labs were ordered yesterday but she  was not able to get them. Continues to have palpitations today She is off her Estrogen at this point.  She is back on the Toprol - skipped 2 days.  Has been taking the PRN propranolol  Not feeling well and has not been eating or drinking well. We given 1 dose of lasix   December 15, 2014:  She has continued to have palpitations  - last 20-30 seconds .  HR has remained fairly high. Has been working out  regularly .  These are going ok.    Feb 03, 2015: [name] continues to have palpitations . She wore a monitor but actually only wore is for about 1/2 of the time ( she was at [employer]) We have discussed implantable loop.  I have some concerns about doing any procedures on her .  She has only been taking the toprol 50 mg a day instead of 75.   May 17, 2015:  [name] is seen today for follow up. She has a history of hyperlipidemia and we sent her to lipid clinic. She is tolerating the Crestor very well.   Still having palpitations but learning how to manage .  Oct. 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 Has been seen at [facility] and is now at [facility]  She has sent scans to [facility]. Is on steroids to shrink tumor, has gained lots of weight.  Is hoping to be able to get a bx so that she have this tumor treated.   She is now on home O2.  O2 sats dropped to 78 while sleeping on room air.    O2 sats on 2.5 liters are good. She uses 4 lpm at night .   Is having more palpitations. Would like a refill for propranolol'   April 18, 2017:  [name] is seen today  Is still on home O2.,  Has chronic respiratory failure ( unclear etiology at this point )   Has RV failure.   Had a right heart cath.  PA pressures were only minimally elevated.  Had significant leg swelling   Has been on steroids.  Has developed pre-diabetes  Past Medical History:  Diagnosis Date  . Addison's disease ([place])   . Adrenal insufficiency ([place])   . Aortic stenosis   . Aortic stenosis   . Appendicitis 12/19/09  . Breast cancer ([place])    STATUS POST BILATERAL MASTECTOMY. STATUS POST RECONSTRUCTION. SHE HAD SILICONE BREAST IMPLANTS AND THE LEFT IMPLANT IS LEAKING SLIGHTLY  . Chest pain   . GERD (gastroesophageal reflux disease)   . Hodgkin lymphoma ([facility])    STATUS POST MANTLE RADIATION  . Hypertension   .  Osteoporosis   . Palpitations   . Raynaud phenomenon   . SVT (supraventricular tachycardia) ([place])   . Tachycardia   . Thyroid cancer ([place])    STATUS POST SURGICAL REMOVAL-CURRENT ON THYROID REPLACEMENT    Past Surgical History:  Procedure Laterality Date  . ABDOMINAL HYSTERECTOMY    . APPENDECTOMY    . CARDIAC CATHETERIZATION  05/18/09   NORMAL CATH  . PITUITARY SURGERY       Current Outpatient Prescriptions  Medication Sig Dispense Refill  . albuterol (VENTOLIN HFA) 108 (90 Base) MCG/ACT inhaler Inhale 2 puffs into the lungs every 6 (six) hours as needed for wheezing or shortness of breath.    . amphetamine-dextroamphetamine (ADDERALL XR) 25 MG  24 hr capsule Take 20 mg by mouth 2 (two) times daily.     . beclomethasone (QVAR) 80 MCG/ACT inhaler Inhale 2 puffs into the lungs 2 (two) times daily.    Marland Kitchen BREO ELLIPTA 200-25 MCG/INH AEPB INHALE 1 INHALATION INTO THE LUNGS ONCE DAILY.  11  . clonazePAM (KLONOPIN) 1 MG tablet Take 1 mg by mouth 3 (three) times daily.     . cyclophosphamide (CYTOXAN) 50 MG tablet Take 50 mg by mouth daily. Give on an empty stomach 1 hour before or 2 hours after meals.    Marland Kitchen dexlansoprazole (DEXILANT) 60 MG capsule Take 60 mg by mouth daily.      . Diclofenac Potassium (CAMBIA) 50 MG PACK Take 1 packet by mouth 3 (three) times daily as needed. AS NEEDED FOR HEADACHES    . Diclofenac Potassium 50 MG PACK Take 1 application by mouth daily as needed (migraines). For migraines    . escitalopram (LEXAPRO) 20 MG tablet Take 30 mg by mouth daily.   1  . estradiol (ESTRACE) 2 MG tablet Take 2 mg by mouth daily.  11  . fluticasone (FLONASE) 50 MCG/ACT nasal spray Place 2 sprays into both nostrils daily.    . fluticasone furoate-vilanterol (BREO ELLIPTA) 200-25 MCG/INH AEPB Inhale 1 puff into the lungs daily.    . hydrocortisone (CORTEF) 10 MG tablet Take 5 mg by mouth 2 (two) times daily.     Marland Kitchen ibuprofen (ADVIL,MOTRIN) 200 MG tablet Take 600 mg by mouth every 6 (six)  hours as needed for moderate pain.    Marland Kitchen levothyroxine (SYNTHROID, LEVOTHROID) 100 MCG tablet Take 100 mcg by mouth daily.      . metoprolol succinate (TOPROL-XL) 25 MG 24 hr tablet TAKE 3 TABLETS BY MOUTH EVERY DAY WITH FOOD OR IMMEDIATELY FOLLOWING MEAL 270 tablet 0  . propranolol (INDERAL) 10 MG tablet Take 1 tablet (10 mg total) by mouth 4 (four) times daily as needed. HBP 90 tablet 3  . rosuvastatin (CRESTOR) 10 MG tablet TAKE 1/2 - 1 TABLET BY MOUTH DAILY. OVERDUE FOR FOLLOW UP. CALL AND SCHEDULE [phone] - 1ST ATTEMPT 30 tablet 10  . sucralfate (CARAFATE) 1 G tablet Take 1 tablet (1 g total) by mouth 4 (four) times daily -  with meals and at bedtime. 20 tablet 0  . traMADol (ULTRAM) 50 MG tablet Take 50 mg by mouth daily as needed. AS NEEDED FOR PAIN    . zolpidem (AMBIEN CR) 12.5 MG CR tablet Take 12.5 mg by mouth at bedtime.       No current facility-administered medications for this visit.     Allergies:   Cymbalta [duloxetine hcl]; Buprenorphine hcl; Compazine; Dilaudid [hydromorphone hcl]; Hydromorphone; Metoclopramide; Morphine and related; Na ferric gluc cplx in sucrose; Promethazine hcl; Metoclopramide hcl; Ondansetron; and Tegaderm ag mesh [silver]    Social History:  The patient  reports that she has never smoked. She has never used smokeless tobacco. She reports that she drinks alcohol. She reports that she does not use drugs.   Family History:  The patient's Family history is unknown by patient.    ROS:  Please see the history of present illness.    Review of Systems: Constitutional:  denies fever, chills, diaphoresis, appetite change and fatigue.  HEENT: denies photophobia, eye pain, redness, hearing loss, ear pain, congestion, sore throat, rhinorrhea, sneezing, neck pain, neck stiffness and tinnitus.  Respiratory: denies SOB, DOE, cough, chest tightness, and wheezing.  Cardiovascular: admits to chest pain, palpitations and leg  and hand swelling.  Gastrointestinal:  denies nausea, vomiting, abdominal pain, diarrhea, constipation, blood in stool.  Genitourinary: denies dysuria, urgency, frequency, hematuria, flank pain and difficulty urinating.  Musculoskeletal: denies  myalgias, back pain, joint swelling, arthralgias and gait problem.   Skin: denies pallor, rash and wound.  Neurological: denies dizziness, seizures, syncope, weakness, light-headedness, numbness and headaches.   Hematological: denies adenopathy, easy bruising, personal or family bleeding history.  Psychiatric/ Behavioral: denies suicidal ideation, mood changes, confusion, nervousness, sleep disturbance and agitation.       All other systems are reviewed and negative.    PHYSICAL EXAM: VS:  BP (!) 138/98   Pulse (!) 118   Ht 5' 5.5" (1.664 m)   Wt 173 lb (78.5 kg)   LMP  (LMP Unknown)   SpO2 97%   BMI 28.35 kg/m  , BMI Body mass index is 28.35 kg/m. GEN: Well nourished, well developed, in no acute distress  HEENT: normal  Neck: no JVD, carotid bruits, or masses Cardiac: RRR;  2/6 systolic murmur,  No  rubs, or gallops,no edema  Respiratory:  clear to auscultation bilaterally, normal work of breathing GI: soft, nontender, nondistended, + BS MS: no deformity or atrophy  Skin: warm and dry, no rash Neuro:  Strength and sensation are intact Psych: normal   EKG:  EKG is ordered today. The ekg ordered today demonstrates NSR at 87. Normal ECG    Recent Labs: No results found for requested labs within last 8760 hours.    Lipid Panel    Component Value Date/Time   CHOL 184 05/16/2015 0933   CHOL 207 (H) 12/08/2013 0823   TRIG 107.0 05/16/2015 0933   TRIG 96 12/08/2013 0823   HDL 63.00 05/16/2015 0933   HDL 57 12/08/2013 0823   CHOLHDL 3 05/16/2015 0933   VLDL 21.4 05/16/2015 0933   LDLCALC 100 (H) 05/16/2015 0933   LDLCALC 131 (H) 12/08/2013 0823      Wt Readings from Last 3 Encounters:  04/17/17 173 lb (78.5 kg)  06/16/15 146 lb (66.2 kg)  05/20/15 139 lb (63  kg)      Other studies Reviewed: Additional studies/ records that were reviewed today include: . Review of the above records demonstrates:    ASSESSMENT AND PLAN:     -  Right heart failure:   Unclear etiology . Has mild pulmonary HTN.   Has been seen at [facility] .  Echo ( Feb.2018)  shows normal LV systolic and diastolic dysfunction .  Normal RV function .  There is no mention of pericardial issues.  The etiology of this  respiratory failure  Lasix has been added to her medical regimen. Will add potassium chloride 10 mEq a day. We'll check basic metabolic profile, liver enzymes, and lipids in 3 weeks. I'll see her in 3 months.  - Palpitations/premature ventricular contractions -  Will refill her propranolol  Continue metoprolol  - history of Hodgkin's lymphoma-status post mantle radiation   - History of breast cancer-status post Reconstruction Has recurrent breast cancer.   With mets to lungs, liver and  brain . Is going to [facility] for further treatments.    4. History of cervical cancer 5. 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 2015 for regrowth of tumor.  6. History of thyroidectomy - hx of thyroid cancer  7. Raynaud's phenomenon 8. Appendectomy 9. History chest pains-normal heart catheterization, normal TEE,,  She's having intermittent episodes of chest palpitations and chest  pains. These chest pains are noncardiac. 10. history of adrenal insufficiency  11.  Palpitations:    I'll see her again in the office in 6  months.  Current medicines are reviewed at length with the patient today.  The patient does not have concerns regarding medicines.  The following changes have been made:  no change  Disposition:   FU with me in me in 6  months.    Signed, Mertie Moores, MD  04/17/2017 4:28 PM    [facility] [address], [place], [place]  [address] Phone: [phone]; Fax: [phone]

## 2017-04-17 NOTE — Patient Instructions (Signed)
Medication Instructions:  START Kdur (potassium) 10 meq once daily   Labwork: Your physician recommends that you return for lab work in: 3 weeks for cholesterol, complete metabolic panel You will need to fast for your lab work (water and black coffee only)   Testing/Procedures: None Ordered   Follow-Up: Your physician recommends that you schedule a follow-up appointment in: 3 months with Dr. Acie Fredrickson   If you need a refill on your cardiac medications before your next appointment, please call your pharmacy.   Thank you for choosing [facility]! Christen Bame, RN [phone]

## 2017-05-16 ENCOUNTER — Other Ambulatory Visit: Payer: Self-pay

## 2017-05-17 ENCOUNTER — Other Ambulatory Visit: Payer: Self-pay | Admitting: Cardiovascular Disease

## 2017-08-01 ENCOUNTER — Ambulatory Visit: Payer: PRIVATE HEALTH INSURANCE | Attending: Psychiatry | Admitting: Physical Therapy

## 2017-08-01 ENCOUNTER — Encounter: Payer: Self-pay | Admitting: Physical Therapy

## 2017-08-01 DIAGNOSIS — M6281 Muscle weakness (generalized): Secondary | ICD-10-CM | POA: Insufficient documentation

## 2017-08-01 DIAGNOSIS — M546 Pain in thoracic spine: Secondary | ICD-10-CM | POA: Diagnosis present

## 2017-08-01 DIAGNOSIS — R2689 Other abnormalities of gait and mobility: Secondary | ICD-10-CM | POA: Diagnosis present

## 2017-08-01 DIAGNOSIS — G8222 Paraplegia, incomplete: Secondary | ICD-10-CM | POA: Diagnosis not present

## 2017-08-01 DIAGNOSIS — R2681 Unsteadiness on feet: Secondary | ICD-10-CM | POA: Diagnosis present

## 2017-08-02 ENCOUNTER — Encounter: Payer: Self-pay | Admitting: Cardiovascular Disease

## 2017-08-02 NOTE — Therapy (Addendum)
[facility] [address] [place], [place], [address] Phone: [phone]   Fax:  [phone]  Physical Therapy Evaluation  Patient Details  Name: [name] MRN: [id number] Date of Birth: Feb 22, 1971 Referring Provider: Terese Door, MD  Encounter Date: 08/01/2017      PT End of Session - 08/02/17 0930    Visit Number 1   Number of Visits 33   Date for PT Re-Evaluation 10/25/17   Authorization Type [employer]   PT Start Time 6834   PT Stop Time 1100   PT Time Calculation (min) 45 min   Equipment Utilized During Treatment Gait belt;Oxygen   Activity Tolerance Patient tolerated treatment well   Behavior During Therapy [facility] for tasks assessed/performed;Anxious      Past Medical History:  Diagnosis Date  . Addison's disease ([place])   . Adrenal insufficiency ([place])   . Aortic stenosis   . Aortic stenosis   . Appendicitis 12/19/09  . Breast cancer ([place])    STATUS POST BILATERAL MASTECTOMY. STATUS POST RECONSTRUCTION. SHE HAD SILICONE BREAST IMPLANTS AND THE LEFT IMPLANT IS LEAKING SLIGHTLY  . Chest pain   . CHF with right heart failure ([place]) 04/17/2017  . GERD (gastroesophageal reflux disease)   . Hodgkin lymphoma (H[facility])    STATUS POST MANTLE RADIATION  . Hypertension   . Osteoporosis   . Palpitations   . Raynaud phenomenon   . SVT (supraventricular tachycardia) ([place])   . Tachycardia   . Thyroid cancer ([place])    STATUS POST SURGICAL REMOVAL-CURRENT ON THYROID REPLACEMENT    Past Surgical History:  Procedure Laterality Date  . ABDOMINAL HYSTERECTOMY    . APPENDECTOMY    . CARDIAC CATHETERIZATION  05/18/09   NORMAL CATH  . PITUITARY SURGERY      There were no vitals filed for this visit.       Subjective Assessment - 08/01/17 1024    Subjective On 06/26/17 she was admitted to [facility] with paraplegia. Patient had complete work-up with no findings for paraplegia with diagnosis of conversion disorder. MRI  showed Cystic lesions noted in each kidney. There is also a small circumscribed T2 hyperintense hepatic lesion but would not cause weakness noted. She was admitted to [facility] 07/09/17-07/16/17. She was referred for Outpatient PT for evaluation.     Patient is accompained by: Family member   Pertinent History Hodgkin's lymphoma 1987 (46yo) s/p mantle radiation & chemotherapy (currently in remission), Breast CA 2000 s/p bilateral masectomies with skin recurrence, recent cervical CA, malignant neoplasms of thyroid gland, anxiety disorder, ADHD, depression, pituitary adenoma s/p adenomectomy, dyspnea    Limitations Lifting;Standing;Walking;House hold activities   Patient Stated Goals Get use of legs back and out of w/c.    Currently in Pain? Yes   Pain Score 4   in last week, worst 7/10, best 2/10   Pain Location Thoracic   Pain Orientation Mid;Right;Left  across lower back   Pain Descriptors / Indicators Spasm;Aching   Pain Type Acute pain   Pain Onset 1 to 4 weeks ago   Pain Frequency Constant   Aggravating Factors  sitting without back support.    Pain Relieving Factors having back support, stretching.             [facility] PT Assessment - 08/01/17 1015      Assessment   Medical Diagnosis incomplete paraplegia conversion disorder   Referring Provider Terese Door, MD   Onset Date/Surgical Date 06/26/17   Prior  Therapy inpatient rehab     Precautions   Precautions Fall  no driving,    Precaution Comments per patient: 2-4 liters of oxygen, 2l constant with activities, 4l pulsed at rest;  [facility] notes are as needed but oxygen saturation WNL on room air & when oxygen turned to 0 liters  [facility] notes state turned O2 to 0 liters with high saturation     Balance Screen   Has the patient fallen in the past 6 months Yes   How many times? 6-7   2 times prior to paraplegia, passed out   Has the patient had a decrease in activity level because of a fear of falling?  No   Is  the patient reluctant to leave their home because of a fear of falling?  No     Home Environment   Living Environment Private residence   Living Arrangements Spouse/significant other;Children  66yo son, has 21yo twins in college   Type of [place] entrance;Stairs to enter  primary entrance   [place] of Steps 7   Entrance Stairs-Rails Right   [place] One level  thresholds between doors.      Prior Function   Level of Independence Independent;Independent with household mobility without device;Independent with community mobility without device   Vocation On disability   Leisure crafts, woodwork     Posture/Postural Control   Posture/Postural Control Postural limitations   Postural Limitations Rounded Shoulders;Forward head;Decreased lumbar lordosis   Posture Comments sitting posture in w/c     Tone   Assessment Location Right Lower Extremity;Left Lower Extremity     ROM / Strength   AROM / PROM / Strength PROM;Strength     PROM   Overall PROM  Within functional limits for tasks performed     Strength   Overall Strength Deficits   Overall Strength Comments MMT of LEs 0/5 except left ankle 2-/5, Patient engaged hip & knee muscles during functional activities of transfers, bed mobility and standing.      Bed Mobility   Bed Mobility Rolling Right;Rolling Left;Supine to Sit;Sit to Supine   Rolling Right 6: Modified independent (Device/Increase time)  using UEs to partially move LEs   Rolling Left 6: Modified independent (Device/Increase time)  using UEs to partially move LEs   Supine to Sit 6: Modified independent (Device/Increase time);HOB flat  using UEs to partially move LEs   Sit to Supine 6: Modified independent (Device/Increase time);HOB flat  using UEs to partially move LEs     Transfers   Transfers Sit to Stand;Stand to Sit;Squat Pivot Transfers   Sit to Stand 3: Mod assist;Without upper extremity assist;From chair/3-in-1   to parallel bars pulling on bars   Stand to Sit 4: Min assist;With upper extremity assist;To chair/3-in-1;With armrests  used parallel bars with controlled descent   Squat Pivot Transfers 6: Modified independent (Device/Increase time);With upper extremity assistance  armrest removed     Balance   Balance Assessed Yes     Static Sitting Balance   Static Sitting - Balance Support No upper extremity supported;Feet supported   Static Sitting - Level of Assistance 5: Stand by assistance   Static Sitting - Comment/# of Minutes 2 minutes, nudge test anterior, posterior, right & left only able to take light pressure before leaning but did not actually loss balance.      Dynamic Sitting Balance   Dynamic Sitting - Balance Support Left upper extremity supported;Feet supported   Dynamic Sitting -  Level of Assistance 5: Stand by assistance   Reach (Patient is able to reach ___ inches to right, left, forward, back) 5     Static Standing Balance   Static Standing - Balance Support Bilateral upper extremity supported  parallel bars   Static Standing - Level of Assistance 4: Min assist  excessive UE support   Static Standing - Comment/# of Minutes stood in parallel bars 30 seconds     Dynamic Standing Balance   Dynamic Standing - Balance Support Bilateral upper extremity supported   Dynamic Standing - Level of Assistance 4: Min assist   Dynamic Standing - Comments turns heads only to scan environment right & left     RLE Tone   RLE Tone Within Functional Limits;Modified [place]worth     RLE Tone   Modified [place]worth Scale for Grading Hypertonia RLE No increase in muscle tone     LLE Tone   LLE Tone Within Functional Limits;Modified [place]worth     LLE Tone   Modified Ashworth Scale for Grading Hypertonia LLE No increase in muscle tone            Objective measurements completed on examination: See above findings.                    PT Short Term Goals - 08/01/17 1954       PT SHORT TERM GOAL #1   Title Patient demonstrates understanding of initial HEP. (All STGs target date: 08/30/2017)   Time 4   Period Weeks   Status New   Target Date 08/30/17     PT SHORT TERM GOAL #2   Title Sit to/from stand w/c to parallel bars with supervision.    Time 4   Period Weeks   Status New   Target Date 08/30/17     PT SHORT TERM GOAL #3   Title Patient able to tolerate standing in parallel bars & perform head turns for 2 minutes with supervision.    Time 4   Period Weeks   Status New   Target Date 08/30/17     PT SHORT TERM GOAL #4   Title Patient ambulates 54' with LRAD with modA.    Time 4   Period Weeks   Status New   Target Date 08/30/17           PT Long Term Goals - 08/01/17 1948      PT LONG TERM GOAL #1   Title Patient is independent with ongoing HEP / fitness plan.  (All LTGs Target Date: 10/25/2017)   Time 12   Period Weeks   Status New   Target Date 10/25/17     PT LONG TERM GOAL #2   Title Patient performs sit to/from stand from chairs with armrests to assistive device modified independent.    Baseline Due to conversion disorder diagnosis, this LTG may be upgraded / changed depending on patient response to treatment.    Time 12   Period Weeks   Status New   Target Date 10/25/17     PT LONG TERM GOAL #3   Title Patient performs standing ADLs with arm support reaching 10", pick up objects from floor, turning trunk & head to scan and managing clothes modified independent.    Baseline Due to conversion disorder diagnosis, this LTG may be upgraded / changed depending on patient response to treatment.    Time 12   Period Weeks   Status New   Target Date 10/25/17  PT LONG TERM GOAL #4   Title Patient ambulates 82' with LRAD with supervision.    Baseline Due to conversion disorder diagnosis, this LTG may be upgraded / changed depending on patient response to treatment.    Time 12   Period Weeks   Status New   Target Date  10/25/17                Plan - 08/02/17 0910    Clinical Impression Statement This 46yo female has onset of incomplete paraplegia that after full work-up at [facility] appears to be conversion disorder. With MMT she could only move left ankle dorsiflexors / wiggle toes. But with transfers (scooting & sit to/from stand), bed mobility & standing in parallel bars, she engaged both hip & knee muscles partially. She performed safe squat-pivot transfer with use of UEs. Bed mobility of sit to/from supine & rolling without PT assistance with some use of UEs to move LEs. Patient performed sit to/from stand pulling on parallel bars with modA from PT. Patient tolerated standing only 30 seconds with excessive UE support but LEs not buckling with some hip & knee muscle activity to support her. She was unable to advance either LE and required seated rest when requested to attempt. She arrived without oxygen unit with saturation rates 98%. PT requested use of oxygen during activities as patient reported she is supposed to be on oxygen at rest with increase during activities. Oxygen saturation stayed 98-99% and heart rate only up to 101 with standing. Patient appears would benefit from skilled PT care to progress mobility and safety.     History and Personal Factors relevant to plan of care: Hodgkin's lymphoma 1987 (46yo) s/p mantle radiation & chemotherapy (currently in remission), Breast CA 2000 s/p bilateral masectomies with skin recurrence, recent cervical CA, malignant neoplasms of thyroid gland, anxiety disorder, ADHD, depression, pituitary adenoma s/p adenomectomy, dyspnea    Clinical Presentation Evolving   Clinical Presentation due to: inconsistent LE muscle activity (unable to move with MMT but uses partially in transfers, bed mobility & standing.    Clinical Decision Making Moderate   Rehab Potential Good   Clinical Impairments Affecting Rehab Potential Hodgkin's lymphoma 1987 (46yo) s/p mantle radiation &  chemotherapy (currently in remission), Breast CA 2000 s/p bilateral masectomies with skin recurrence, recent cervical CA, malignant neoplasms of thyroid gland, anxiety disorder, ADHD, depression, pituitary adenoma s/p adenomectomy, dyspnea    PT Frequency 3x / week  3x/wk for 8 weeks, then 2x/wk for 4 weeks   PT Duration 12 weeks   PT Treatment/Interventions ADLs/Self Care Home Management;DME Instruction;Gait training;Stair training;Functional mobility training;Therapeutic activities;Therapeutic exercise;Balance training;Neuromuscular re-education;Patient/family education;Wheelchair mobility training;Vestibular   PT Next Visit Plan establish HEP utilize ADLs /activities as engages muscles with activity but not with MMT, Standing frame with UE & trunk activiites   Consulted and Agree with Plan of Care Patient;Family member/caregiver   Family Member Consulted husband      Patient will benefit from skilled therapeutic intervention in order to improve the following deficits and impairments:  Abnormal gait, Decreased activity tolerance, Decreased balance, Decreased endurance, Decreased knowledge of use of DME, Decreased mobility, Decreased strength, Pain  Visit Diagnosis: Paraplegia, incomplete (HCC)  Unsteadiness on feet  Other abnormalities of gait and mobility  Muscle weakness (generalized)  Pain in thoracic spine     Problem List Patient Active Problem List   Diagnosis Date Noted  . CHF with right heart failure ([place]) 04/17/2017  . Edema 11/16/2014  . Dizziness 11/16/2014  .  Hyperlipidemia 03/09/2014  . Hodgkin lymphoma ([place])   . Breast cancer ([place])   . Thyroid cancer ([place])   . Adrenal insufficiency ([place])   . Tachycardia   . Heart palpitations   . Chest discomfort   . Hypertension   . Raynaud phenomenon   . Appendicitis   . GERD (gastroesophageal reflux disease)     Brandonlee Navis PT, DPT 08/02/2017, 9:58 AM  [facility] [address] [place], [place], [address] Phone: [phone]   Fax:  [phone]  Name: [name] MRN: [id number] Date of Birth: 1971-09-18  PT added modalities including dry needling to treatment plan.    08/02/17 0910  Plan  Clinical Impression Statement This 46yo female has onset of incomplete paraplegia that after full work-up at [facility] appears to be conversion disorder. With MMT she could only move left ankle dorsiflexors / wiggle toes. But with transfers (scooting & sit to/from stand), bed mobility & standing in parallel bars, she engaged both hip & knee muscles partially. She performed safe squat-pivot transfer with use of UEs. Bed mobility of sit to/from supine & rolling without PT assistance with some use of UEs to move LEs. Patient performed sit to/from stand pulling on parallel bars with modA from PT. Patient tolerated standing only 30 seconds with excessive UE support but LEs not buckling with some hip & knee muscle activity to support her. She was unable to advance either LE and required seated rest when requested to attempt. She arrived without oxygen unit with saturation rates 98%. PT requested use of oxygen during activities as patient reported she is supposed to be on oxygen at rest with increase during activities. Oxygen saturation stayed 98-99% and heart rate only up to 101 with standing. Patient appears would benefit from skilled PT care to progress mobility and safety.    History and Personal Factors relevant to plan of care: Hodgkin's lymphoma 1987 (46yo) s/p mantle radiation & chemotherapy (currently in remission), Breast CA 2000 s/p bilateral masectomies with skin recurrence, recent cervical CA, malignant neoplasms of thyroid gland, anxiety disorder, ADHD, depression, pituitary adenoma s/p adenomectomy, dyspnea   Clinical Presentation Evolving  Clinical Presentation due to: inconsistent LE muscle activity (unable to move with MMT but uses partially in transfers,  bed mobility & standing.   Clinical Decision Making Moderate  Pt will benefit from skilled therapeutic intervention in order to improve on the following deficits Abnormal gait;Decreased activity tolerance;Decreased balance;Decreased endurance;Decreased knowledge of use of DME;Decreased mobility;Decreased strength;Pain  Rehab Potential Good  Clinical Impairments Affecting Rehab Potential Hodgkin's lymphoma 1987 (46yo) s/p mantle radiation & chemotherapy (currently in remission), Breast CA 2000 s/p bilateral masectomies with skin recurrence, recent cervical CA, malignant neoplasms of thyroid gland, anxiety disorder, ADHD, depression, pituitary adenoma s/p adenomectomy, dyspnea   PT Frequency 3x / week (3x/wk for 8 weeks, then 2x/wk for 4 weeks)  PT Duration 12 weeks  PT Treatment/Interventions ADLs/Self Care Home Management;DME Instruction;Gait training;Stair training;Functional mobility training;Therapeutic activities;Therapeutic exercise;Balance training;Neuromuscular re-education;Patient/family education;Wheelchair mobility training;Vestibular;Electrical Stimulation;Moist Heat;Manual techniques;Dry needling  PT Next Visit Plan establish HEP utilize ADLs /activities as engages muscles with activity but not with MMT, Standing frame with UE & trunk activiites  Consulted and Agree with Plan of Care Patient;Family member/caregiver  Family Member Consulted husband    Jamey Reas, PT, DPT PT Specializing in [place] 08/14/17 11:07 PM Phone:  [phone]  Fax:  [phone] [facility] [address] [place], [place] [address]

## 2017-08-05 ENCOUNTER — Ambulatory Visit: Payer: PRIVATE HEALTH INSURANCE

## 2017-08-05 VITALS — HR 98

## 2017-08-05 DIAGNOSIS — M546 Pain in thoracic spine: Secondary | ICD-10-CM

## 2017-08-05 DIAGNOSIS — G8222 Paraplegia, incomplete: Secondary | ICD-10-CM | POA: Diagnosis not present

## 2017-08-05 DIAGNOSIS — R2689 Other abnormalities of gait and mobility: Secondary | ICD-10-CM

## 2017-08-05 DIAGNOSIS — R2681 Unsteadiness on feet: Secondary | ICD-10-CM

## 2017-08-05 DIAGNOSIS — M6281 Muscle weakness (generalized): Secondary | ICD-10-CM

## 2017-08-05 NOTE — Patient Instructions (Signed)
1) Step ups while seated: Sit up tall and place 2 inch block/book on floor beside foot. Lift Right foot onto block and then lower back down. Repeat with Left foot. x5 reps/foot every day.   Scapular Retraction: Seated    Hold end of band in each hand. Seated up nice and tall, keep elbows tucked by sides. Pull back until elbows are even with trunk. Keep elbows by sides, thumbs up. Slowly release for 3-5 seconds. Use _green_______ resistance band. __10_ reps per set, __2_ sets per day, _3__ days per week.   http://ecce.exer.us/227   Copyright  VHI. All rights reserved.   3)   Use green theraband. Keep elbows tucked at your side while seated. Bring arm out to the side, like you're opening a door, keep thumbs up like the picture. Perform 2 sets of 10 reps, 3 times a week.

## 2017-08-05 NOTE — Therapy (Signed)
[facility] [address] [place], [place], [address] Phone: [phone]   Fax:  [phone]  Physical Therapy Treatment  Patient Details  Name: [name] MRN: [id number] Date of Birth: May 27, 1971 Referring Provider: Terese Door, MD   Encounter Date: 08/05/2017  PT End of Session - 08/05/17 1411    Visit Number  2    Number of Visits  33    Date for PT Re-Evaluation  10/25/17    Authorization Type  [employer]    PT Start Time  1321    PT Stop Time  1401    PT Time Calculation (min)  40 min    Equipment Utilized During Treatment  Oxygen 2L SpO2   2L SpO2   Activity Tolerance  Patient tolerated treatment well    Behavior During Therapy  [facility] for tasks assessed/performed       Past Medical History:  Diagnosis Date  . Addison's disease ([place])   . Adrenal insufficiency ([place])   . Aortic stenosis   . Aortic stenosis   . Appendicitis 12/19/09  . Breast cancer ([place])    STATUS POST BILATERAL MASTECTOMY. STATUS POST RECONSTRUCTION. SHE HAD SILICONE BREAST IMPLANTS AND THE LEFT IMPLANT IS LEAKING SLIGHTLY  . Chest pain   . CHF with right heart failure ([place]) 04/17/2017  . GERD (gastroesophageal reflux disease)   . Hodgkin lymphoma ([facility])    STATUS POST MANTLE RADIATION  . Hypertension   . Osteoporosis   . Palpitations   . Raynaud phenomenon   . SVT (supraventricular tachycardia) ([place])   . Tachycardia   . Thyroid cancer ([place])    STATUS POST SURGICAL REMOVAL-CURRENT ON THYROID REPLACEMENT    Past Surgical History:  Procedure Laterality Date  . ABDOMINAL HYSTERECTOMY    . APPENDECTOMY    . CARDIAC CATHETERIZATION  05/18/09   NORMAL CATH  . PITUITARY SURGERY      Vitals:   08/05/17 1326  Pulse: 98  SpO2: 98%    Subjective Assessment - 08/05/17 1326    Subjective  Pt reported she's very tired from appointments last week. Pt saw a chiropractor on Thursday to adjust her neck, he also suggested using the TENs unit  on her legs.     Patient is accompained by:  -- caregiver   caregiver   Pertinent History  Hodgkin's lymphoma 1987 (46yo) s/p mantle radiation & chemotherapy (currently in remission), Breast CA 2000 s/p bilateral masectomies with skin recurrence, recent cervical CA, malignant neoplasms of thyroid gland, anxiety disorder, ADHD, depression, pituitary adenoma s/p adenomectomy, dyspnea     Limitations  Lifting;Standing;Walking;House hold activities    Patient Stated Goals  Get use of legs back and out of w/c.     Currently in Pain?  Yes    Pain Score  3     Pain Location  Hip    Pain Orientation  Right;Left    Pain Descriptors / Indicators  Aching    Pain Type  Acute pain    Pain Onset  In the past 7 days    Pain Frequency  Intermittent    Aggravating Factors   lying in the bed    Pain Relieving Factors  changing position          Therex; Please see pt instruction for details. All performed seated with S for safety. No incr. In pain noted during session.             [facility] Adult PT Treatment/Exercise - 08/05/17  1408      Exercises   Exercises  Knee/Hip      Knee/Hip Exercises: Seated   Other Seated Knee/Hip Exercises  PT assisted pt in B hip flexion and B knee ext, flex, and B DF x5 reps/LE/activity. Pt was able to perform B toes flex/ext, White ankle DF/PF, B hip flexion, and trace White knee ext all in limited range with cues and demo.              PT Education - 08/05/17 1410    Education provided  Yes    Education Details  PT provided pt with HEP in order to improve strength and posture. PT educated pt on placing towel roll along spine while in supine. PT educated pt that NMR with TENS is helpful with active-assisted movement, as pt stated chiropractor told pt to place TENS on B LEs to move LEs.     Person(s) Educated  Patient;Caregiver(s)    Methods  Explanation;Demonstration;Verbal cues;Handout    Comprehension  Returned demonstration;Verbalized understanding        PT Short Term Goals - 08/01/17 1954      PT SHORT TERM GOAL #1   Title  Patient demonstrates understanding of initial HEP. (All STGs target date: 08/30/2017)    Time  4    Period  Weeks    Status  New    Target Date  08/30/17      PT SHORT TERM GOAL #2   Title  Sit to/from stand w/c to parallel bars with supervision.     Time  4    Period  Weeks    Status  New    Target Date  08/30/17      PT SHORT TERM GOAL #3   Title  Patient able to tolerate standing in parallel bars & perform head turns for 2 minutes with supervision.     Time  4    Period  Weeks    Status  New    Target Date  08/30/17      PT SHORT TERM GOAL #4   Title  Patient ambulates 73' with LRAD with modA.     Time  4    Period  Weeks    Status  New    Target Date  08/30/17        PT Long Term Goals - 08/01/17 1948      PT LONG TERM GOAL #1   Title  Patient is independent with ongoing HEP / fitness plan.  (All LTGs Target Date: 10/25/2017)    Time  12    Period  Weeks    Status  New    Target Date  10/25/17      PT LONG TERM GOAL #2   Title  Patient performs sit to/from stand from chairs with armrests to assistive device modified independent.     Baseline  Due to conversion disorder diagnosis, this LTG may be upgraded / changed depending on patient response to treatment.     Time  12    Period  Weeks    Status  New    Target Date  10/25/17      PT LONG TERM GOAL #3   Title  Patient performs standing ADLs with arm support reaching 10", pick up objects from floor, turning trunk & head to scan and managing clothes modified independent.     Baseline  Due to conversion disorder diagnosis, this LTG may be upgraded / changed depending on patient response  to treatment.     Time  12    Period  Weeks    Status  New    Target Date  10/25/17      PT LONG TERM GOAL #4   Title  Patient ambulates 61' with LRAD with supervision.     Baseline  Due to conversion disorder diagnosis, this LTG may be upgraded /  changed depending on patient response to treatment.     Time  12    Period  Weeks    Status  New    Target Date  10/25/17            Plan - 08/05/17 1413    Clinical Impression Statement  Pt demonstrated progress, as she was able to perform B hip flexion and B DF to lift B feet onto 2" block, indicating improved muscle activation in B LEs. MMT for B hip flex: 1/5, White knee ext: 1/5, B DF: 2/5. Pt able to perform w/c<>mat txfs at MOD I level with what appeared to be engaged BLE musculature during txf. PT also focused on posture and B UE strength, as pt reports fatigue with manual w/c propulsion. Pt required two rest breaks 2/2 fatigue. Pt would continue to benefit from skilled PT to improve safety during functional mobility.     Rehab Potential  Good    Clinical Impairments Affecting Rehab Potential  Hodgkin's lymphoma 1987 (46yo) s/p mantle radiation & chemotherapy (currently in remission), Breast CA 2000 s/p bilateral masectomies with skin recurrence, recent cervical CA, malignant neoplasms of thyroid gland, anxiety disorder, ADHD, depression, pituitary adenoma s/p adenomectomy, dyspnea     PT Frequency  3x / week 3x/wk for 8 weeks, then 2x/wk for 4 weeks   3x/wk for 8 weeks, then 2x/wk for 4 weeks   PT Duration  12 weeks    PT Treatment/Interventions  ADLs/Self Care Home Management;DME Instruction;Gait training;Stair training;Functional mobility training;Therapeutic activities;Therapeutic exercise;Balance training;Neuromuscular re-education;Patient/family education;Wheelchair mobility training;Vestibular    PT Next Visit Plan  Establish standing HEP- utilize ADLs /activities as engages muscles with activity but not with MMT, Standing frame with UE & trunk activiites    Consulted and Agree with Plan of Care  Patient;Family member/caregiver    Family Member Consulted  husband       Patient will benefit from skilled therapeutic intervention in order to improve the following deficits and  impairments:  Abnormal gait, Decreased activity tolerance, Decreased balance, Decreased endurance, Decreased knowledge of use of DME, Decreased mobility, Decreased strength, Pain  Visit Diagnosis: Muscle weakness (generalized)  Paraplegia, incomplete (H[facility])  Unsteadiness on feet  Pain in thoracic spine  Other abnormalities of gait and mobility     Problem List Patient Active Problem List   Diagnosis Date Noted  . CHF with right heart failure ([place]) 04/17/2017  . Edema 11/16/2014  . Dizziness 11/16/2014  . Hyperlipidemia 03/09/2014  . Hodgkin lymphoma ([place])   . Breast cancer ([place])   . Thyroid cancer ([place])   . Adrenal insufficiency ([place])   . Tachycardia   . Heart palpitations   . Chest discomfort   . Hypertension   . Raynaud phenomenon   . Appendicitis   . GERD (gastroesophageal reflux disease)     [name] 08/05/2017, 2:16 PM  [facility] [address] [place], [place], [address] Phone: [phone]   Fax:  [phone]  Name: [name] MRN: [id number] Date of Birth: [birth year]  Geoffry Paradise, PT,DPT 08/05/17 2:17 PM Phone:  [phone] Fax: [phone]

## 2017-08-07 ENCOUNTER — Ambulatory Visit: Payer: PRIVATE HEALTH INSURANCE

## 2017-08-07 ENCOUNTER — Telehealth: Payer: Self-pay

## 2017-08-07 ENCOUNTER — Other Ambulatory Visit: Payer: Self-pay | Admitting: Cardiovascular Disease

## 2017-08-07 DIAGNOSIS — G8222 Paraplegia, incomplete: Secondary | ICD-10-CM

## 2017-08-07 DIAGNOSIS — M6281 Muscle weakness (generalized): Secondary | ICD-10-CM

## 2017-08-07 DIAGNOSIS — R2681 Unsteadiness on feet: Secondary | ICD-10-CM

## 2017-08-07 DIAGNOSIS — M546 Pain in thoracic spine: Secondary | ICD-10-CM

## 2017-08-07 DIAGNOSIS — R2689 Other abnormalities of gait and mobility: Secondary | ICD-10-CM

## 2017-08-07 NOTE — Telephone Encounter (Signed)
Dr. Bella Kennedy  I'm currently seeing Ms. Rollene Rotunda for physical therapy. She stated that you also requested an OT order for pt, however, we do not have an OT order. If you wish for pt to see OT to address difficulty with ADLs. Please send [place] a referral.  Thank you, Geoffry Paradise, PT,DPT 08/07/17 4:14 PM Phone: [phone] Fax: [phone]

## 2017-08-07 NOTE — Patient Instructions (Addendum)
Perform with grab bar at home or at kitchen sink:   Weight Shift: Lateral (Limits of Stability)    Slowly shift weight to right as far as possible, without taking a step. Return to starting position. Shift to opposite side. Hold each position __2__ seconds. Repeat __10__ times per session. Do __1-2__ sessions per day.   Copyright  VHI. All rights reserved.  Weight Shift: Anterior / Posterior (Limits of Stability)    Slowly shift weight backward until toes begin to rise off floor. Return to starting position. Shift weight slowly forward until heels begin to rise off floor. Hold each position __2__ seconds. Repeat _10___ times per session. Do __1-2__ sessions per day.  Copyright  VHI. All rights reserved.   Lower Trunk Rotation Stretch    Keeping back flat and feet together, rotate knees to left side. Hold __30__ seconds. Repeat to the other side Repeat __3__ times per set per side. Do __1__ sets per session. Do __2-3__ sessions per day.  http://orth.exer.us/123   Copyright  VHI. All rights reserved.   Double Knee to Chest (Flexion)    Gently pull both knees toward chest. Feel stretch in lower back or buttock area, gently rock side to side for 10 seconds. Repeat __2-3__ times. Do _1-2___ sessions per day.  http://gt2.exer.us/228   Copyright  VHI. All rights reserved.

## 2017-08-07 NOTE — Therapy (Signed)
[facility] [address] [place], [place], [address] Phone: [phone]   Fax:  [phone]  Physical Therapy Treatment  Patient Details  Name: [name] MRN: [id number] Date of Birth: 03-May-1971 Referring Provider: Terese Door, MD   Encounter Date: 08/07/2017  PT End of Session - 08/07/17 1416    Visit Number  3    Number of Visits  33    Date for PT Re-Evaluation  10/25/17    Authorization Type  Medcost    PT Start Time  1318    PT Stop Time  1401    PT Time Calculation (min)  43 min    Equipment Utilized During Treatment  -- pt not using SpO2 today   pt not using SpO2 today   Activity Tolerance  Patient tolerated treatment well    Behavior During Therapy  [facility] for tasks assessed/performed       Past Medical History:  Diagnosis Date  . Addison's disease ([place])   . Adrenal insufficiency ([place])   . Aortic stenosis   . Aortic stenosis   . Appendicitis 12/19/09  . Breast cancer ([place])    STATUS POST BILATERAL MASTECTOMY. STATUS POST RECONSTRUCTION. SHE HAD SILICONE BREAST IMPLANTS AND THE LEFT IMPLANT IS LEAKING SLIGHTLY  . Chest pain   . CHF with right heart failure ([place]) 04/17/2017  . GERD (gastroesophageal reflux disease)   . Hodgkin lymphoma ([facility])    STATUS POST MANTLE RADIATION  . Hypertension   . Osteoporosis   . Palpitations   . Raynaud phenomenon   . SVT (supraventricular tachycardia) ([place])   . Tachycardia   . Thyroid cancer ([place])    STATUS POST SURGICAL REMOVAL-CURRENT ON THYROID REPLACEMENT    Past Surgical History:  Procedure Laterality Date  . ABDOMINAL HYSTERECTOMY    . APPENDECTOMY    . CARDIAC CATHETERIZATION  05/18/09   NORMAL CATH  . PITUITARY SURGERY      There were no vitals filed for this visit.  Subjective Assessment - 08/07/17 1322    Subjective  Pt reports she'd like to work on back strengthening exercises, as back is sore. Pt reports she was fatigued after last session.      Pertinent History  Hodgkin's lymphoma 1987 (46yo) s/p mantle radiation & chemotherapy (currently in remission), Breast CA 2000 s/p bilateral masectomies with skin recurrence, recent cervical CA, malignant neoplasms of thyroid gland, anxiety disorder, ADHD, depression, pituitary adenoma s/p adenomectomy, dyspnea     Patient Stated Goals  Get use of legs back and out of w/c.     Currently in Pain?  Yes    Pain Score  2     Pain Location  Back    Pain Orientation  Right;Left    Pain Descriptors / Indicators  Aching    Pain Type  Acute pain    Pain Onset  In the past 7 days    Pain Frequency  Intermittent    Aggravating Factors   lying in the bed    Pain Relieving Factors  changing position           Neuro re-ed: Pt performed in // bars with min guard to min A for safety. Please see pt instructions for HEP details.  Therex: Pt performed supine stretches with S-min A for technique. Please see pt instructions for HEP details.            [facility] Adult PT Treatment/Exercise - 08/07/17 1409      Ambulation/Gait  Ambulation/Gait  Yes    Ambulation/Gait Assistance  4: Min guard    Ambulation Distance (Feet)  -- 2x5 reps of shuffling forward gait   2x5 reps of shuffling forward gait   Assistive device  Parallel bars    Gait Pattern  Step-to pattern;Decreased stride length;Decreased weight shift to right;Decreased weight shift to left;Decreased dorsiflexion - left;Decreased dorsiflexion - right;Decreased hip/knee flexion - right;Decreased hip/knee flexion - left;Shuffle;Poor foot clearance - left;Poor foot clearance - right    Ambulation Surface  Level;Indoor    Gait Comments  Pt able to take shuffling steps forward but unable to perform swing phase of gait and required two seated rest breaks 2/2 fatigue.              PT Education - 08/07/17 1416    Education provided  Yes    Education Details  PT provided pt with balance and stretching HEP.     Person(s) Educated   Patient;Caregiver(s)    Methods  Explanation;Demonstration;Tactile cues;Verbal cues;Handout    Comprehension  Returned demonstration;Verbalized understanding       PT Short Term Goals - 08/01/17 1954      PT SHORT TERM GOAL #1   Title  Patient demonstrates understanding of initial HEP. (All STGs target date: 08/30/2017)    Time  4    Period  Weeks    Status  New    Target Date  08/30/17      PT SHORT TERM GOAL #2   Title  Sit to/from stand w/c to parallel bars with supervision.     Time  4    Period  Weeks    Status  New    Target Date  08/30/17      PT SHORT TERM GOAL #3   Title  Patient able to tolerate standing in parallel bars & perform head turns for 2 minutes with supervision.     Time  4    Period  Weeks    Status  New    Target Date  08/30/17      PT SHORT TERM GOAL #4   Title  Patient ambulates 24' with LRAD with modA.     Time  4    Period  Weeks    Status  New    Target Date  08/30/17        PT Long Term Goals - 08/01/17 1948      PT LONG TERM GOAL #1   Title  Patient is independent with ongoing HEP / fitness plan.  (All LTGs Target Date: 10/25/2017)    Time  12    Period  Weeks    Status  New    Target Date  10/25/17      PT LONG TERM GOAL #2   Title  Patient performs sit to/from stand from chairs with armrests to assistive device modified independent.     Baseline  Due to conversion disorder diagnosis, this LTG may be upgraded / changed depending on patient response to treatment.     Time  12    Period  Weeks    Status  New    Target Date  10/25/17      PT LONG TERM GOAL #3   Title  Patient performs standing ADLs with arm support reaching 10", pick up objects from floor, turning trunk & head to scan and managing clothes modified independent.     Baseline  Due to conversion disorder diagnosis, this LTG may be upgraded / changed  depending on patient response to treatment.     Time  12    Period  Weeks    Status  New    Target Date  10/25/17       PT LONG TERM GOAL #4   Title  Patient ambulates 61' with LRAD with supervision.     Baseline  Due to conversion disorder diagnosis, this LTG may be upgraded / changed depending on patient response to treatment.     Time  12    Period  Weeks    Status  New    Target Date  10/25/17            Plan - 08/07/17 1417    Clinical Impression Statement  Pt demonstrated progress, as she was able to take shuffling anterior steps with BLE and BUE support/assist in // bars today. Pt continues to require frequent seated rest break 2/2 fatigue and back pain. Pt's back pain decr. after supine stretches. Pt did not use SpO2 today and did not c/o SOB. Continue with POC.     Rehab Potential  Good    Clinical Impairments Affecting Rehab Potential  Hodgkin's lymphoma 1987 (46yo) s/p mantle radiation & chemotherapy (currently in remission), Breast CA 2000 s/p bilateral masectomies with skin recurrence, recent cervical CA, malignant neoplasms of thyroid gland, anxiety disorder, ADHD, depression, pituitary adenoma s/p adenomectomy, dyspnea     PT Frequency  3x / week 3x/wk for 8 weeks, then 2x/wk for 4 weeks   3x/wk for 8 weeks, then 2x/wk for 4 weeks   PT Duration  12 weeks    PT Treatment/Interventions  ADLs/Self Care Home Management;DME Instruction;Gait training;Stair training;Functional mobility training;Therapeutic activities;Therapeutic exercise;Balance training;Neuromuscular re-education;Patient/family education;Wheelchair mobility training;Vestibular    PT Next Visit Plan  Amb. in // bars and  Standing frame with UE & trunk activiites    Consulted and Agree with Plan of Care  Patient;Family member/caregiver    Family Member Consulted  husband       Patient will benefit from skilled therapeutic intervention in order to improve the following deficits and impairments:  Abnormal gait, Decreased activity tolerance, Decreased balance, Decreased endurance, Decreased knowledge of use of DME, Decreased  mobility, Decreased strength, Pain  Visit Diagnosis: Paraplegia, incomplete (HCC)  Muscle weakness (generalized)  Pain in thoracic spine  Other abnormalities of gait and mobility  Unsteadiness on feet     Problem List Patient Active Problem List   Diagnosis Date Noted  . CHF with right heart failure ([place]) 04/17/2017  . Edema 11/16/2014  . Dizziness 11/16/2014  . Hyperlipidemia 03/09/2014  . Hodgkin lymphoma ([place])   . Breast cancer ([place])   . Thyroid cancer ([place])   . Adrenal insufficiency ([place])   . Tachycardia   . Heart palpitations   . Chest discomfort   . Hypertension   . Raynaud phenomenon   . Appendicitis   . GERD (gastroesophageal reflux disease)     Kobe Jansma L 08/07/2017, 2:20 PM  [facility] [address] [place], [place], [address] Phone: [phone]   Fax:  [phone]  Name: GENNETTE SHADIX MRN: [id number] Date of Birth: [birth year]  Geoffry Paradise, PT,DPT 08/07/17 2:23 PM Phone: [phone] Fax: [phone]

## 2017-08-09 ENCOUNTER — Ambulatory Visit: Payer: PRIVATE HEALTH INSURANCE

## 2017-08-09 DIAGNOSIS — R2681 Unsteadiness on feet: Secondary | ICD-10-CM

## 2017-08-09 DIAGNOSIS — M546 Pain in thoracic spine: Secondary | ICD-10-CM

## 2017-08-09 DIAGNOSIS — M6281 Muscle weakness (generalized): Secondary | ICD-10-CM

## 2017-08-09 DIAGNOSIS — R2689 Other abnormalities of gait and mobility: Secondary | ICD-10-CM

## 2017-08-09 DIAGNOSIS — G8222 Paraplegia, incomplete: Secondary | ICD-10-CM | POA: Diagnosis not present

## 2017-08-09 NOTE — Patient Instructions (Signed)
  1) Cervical retraction: seated upright with pillow behind your head ( seated upright against wall). Tuck your chin and push head into pillow and hold for 2-3 seconds. Repeat 10 times.   Hamstring Stretch, Seated (Strap, Two Chairs)    Sit with one leg extended onto facing chair. DON'T USE STRAP. Lean forward and keep back straight. Hold for __30__ seconds. Repeat __3__ times each leg.  Copyright  VHI. All rights reserved.

## 2017-08-09 NOTE — Therapy (Signed)
[facility] [address] [place], [place], [address] Phone: [phone]   Fax:  [phone]  Physical Therapy Treatment  Patient Details  Name: [name] MRN: [id number] Date of Birth: 12-20-1970 Referring Provider: Terese Door, MD   Encounter Date: 08/09/2017  PT End of Session - 08/09/17 1418    Visit Number  4    Number of Visits  33    Date for PT Re-Evaluation  10/25/17    Authorization Type  [employer]    PT Start Time  1062 pt arrived late    PT Stop Time  0937    PT Time Calculation (min)  42 min    Equipment Utilized During Treatment  Gait belt    Activity Tolerance  Patient tolerated treatment well    Behavior During Therapy  [facility] for tasks assessed/performed       Past Medical History:  Diagnosis Date  . Addison's disease ([place])   . Adrenal insufficiency ([place])   . Aortic stenosis   . Aortic stenosis   . Appendicitis 12/19/09  . Breast cancer ([place])    STATUS POST BILATERAL MASTECTOMY. STATUS POST RECONSTRUCTION. SHE HAD SILICONE BREAST IMPLANTS AND THE LEFT IMPLANT IS LEAKING SLIGHTLY  . Chest pain   . CHF with right heart failure ([place]) 04/17/2017  . GERD (gastroesophageal reflux disease)   . Hodgkin lymphoma ([facility])    STATUS POST MANTLE RADIATION  . Hypertension   . Osteoporosis   . Palpitations   . Raynaud phenomenon   . SVT (supraventricular tachycardia) ([place])   . Tachycardia   . Thyroid cancer ([place])    STATUS POST SURGICAL REMOVAL-CURRENT ON THYROID REPLACEMENT    Past Surgical History:  Procedure Laterality Date  . ABDOMINAL HYSTERECTOMY    . APPENDECTOMY    . CARDIAC CATHETERIZATION  05/18/09   NORMAL CATH  . PITUITARY SURGERY      There were no vitals filed for this visit.  Subjective Assessment - 08/09/17 0857    Subjective  Pt arrived late today 2/2 transportation. Pt denied falls or changes since last visit.     Pertinent History  Hodgkin's lymphoma 1987 (46yo) s/p mantle  radiation & chemotherapy (currently in remission), Breast CA 2000 s/p bilateral masectomies with skin recurrence, recent cervical CA, malignant neoplasms of thyroid gland, anxiety disorder, ADHD, depression, pituitary adenoma s/p adenomectomy, dyspnea     Patient Stated Goals  Get use of legs back and out of w/c.     Currently in Pain?  Yes    Pain Score  -- 1-2/10    Pain Location  Back    Pain Orientation  Right;Left    Pain Descriptors / Indicators  Aching    Pain Type  Acute pain    Pain Onset  In the past 7 days    Pain Frequency  Intermittent    Aggravating Factors   prolonged position    Pain Relieving Factors  stretches.        Therex: Seated activities performed to improve posture and flexibility. No incr. Pain reported during activities, therefore, added to HEP. Please see pt instructions for HEP details.  Frequent cues to redirect focus to activities.               [facility] Adult PT Treatment/Exercise - 08/09/17 1413      Transfers   Transfers  Sit to Stand;Stand to Sit    Sit to Stand  4: Min guard;With upper extremity assist;From chair/3-in-1  Stand to Sit  4: Min guard;With upper extremity assist;To chair/3-in-1    Comments  Pt performed 3 reps of STS txfs (from w/c) and less assist today with reduced shaking of BUE. Pt also attempted standing in // bars with PT tech when PT was writing pain per pt report. PT did not authorize this txf, PT tech reported pt performed txf without requesting assist and performed without difficulty, PT made pt sit back down again promptly for safety.      Ambulation/Gait   Ambulation/Gait  Yes    Ambulation/Gait Assistance  4: Min guard    Ambulation Distance (Feet)  -- 7 shuffling steps forward    Assistive device  Parallel bars    Gait Pattern  Step-to pattern;Decreased stride length;Decreased weight shift to right;Decreased weight shift to left;Decreased dorsiflexion - left;Decreased dorsiflexion - right;Decreased hip/knee  flexion - right;Decreased hip/knee flexion - left;Shuffle;Poor foot clearance - left;Poor foot clearance - right    Ambulation Surface  Level;Indoor    Gait Comments  Pt able to take shuffling steps forward but unable to perform swing phase of gait and required one seated rest breaks 2/2 fatigue.           Balance Exercises - 08/09/17 1414      Balance Exercises: Standing   Standing Eyes Opened  Wide (BOA);Solid surface    Other Standing Exercises  Performed in // bars with min guard: pt performed ant/lat/post weight shifting x10 reps/side with cues for technique and B UE support.         PT Education - 08/09/17 1417    Education provided  Yes    Education Details  PT provided pt with seated HEP. Pt asked if she could begin OPPT ortho PT, to address incr. muscle tightness and pain in the thoracic spine area. PT will message OP ortho site to request dry needling assessment. PT reiterated the importance of performing standing HEP with somebody present to assist at all times, as pt attempted to txf without assist during PT session.     Person(s) Educated  Patient    Methods  Explanation;Demonstration;Verbal cues;Handout    Comprehension  Returned demonstration;Verbalized understanding       PT Short Term Goals - 08/01/17 1954      PT SHORT TERM GOAL #1   Title  Patient demonstrates understanding of initial HEP. (All STGs target date: 08/30/2017)    Time  4    Period  Weeks    Status  New    Target Date  08/30/17      PT SHORT TERM GOAL #2   Title  Sit to/from stand w/c to parallel bars with supervision.     Time  4    Period  Weeks    Status  New    Target Date  08/30/17      PT SHORT TERM GOAL #3   Title  Patient able to tolerate standing in parallel bars & perform head turns for 2 minutes with supervision.     Time  4    Period  Weeks    Status  New    Target Date  08/30/17      PT SHORT TERM GOAL #4   Title  Patient ambulates 24' with LRAD with modA.     Time  4     Period  Weeks    Status  New    Target Date  08/30/17        PT Long Term Goals -  08/01/17 1948      PT LONG TERM GOAL #1   Title  Patient is independent with ongoing HEP / fitness plan.  (All LTGs Target Date: 10/25/2017)    Time  12    Period  Weeks    Status  New    Target Date  10/25/17      PT LONG TERM GOAL #2   Title  Patient performs sit to/from stand from chairs with armrests to assistive device modified independent.     Baseline  Due to conversion disorder diagnosis, this LTG may be upgraded / changed depending on patient response to treatment.     Time  12    Period  Weeks    Status  New    Target Date  10/25/17      PT LONG TERM GOAL #3   Title  Patient performs standing ADLs with arm support reaching 10", pick up objects from floor, turning trunk & head to scan and managing clothes modified independent.     Baseline  Due to conversion disorder diagnosis, this LTG may be upgraded / changed depending on patient response to treatment.     Time  12    Period  Weeks    Status  New    Target Date  10/25/17      PT LONG TERM GOAL #4   Title  Patient ambulates 91' with LRAD with supervision.     Baseline  Due to conversion disorder diagnosis, this LTG may be upgraded / changed depending on patient response to treatment.     Time  12    Period  Weeks    Status  New    Target Date  10/25/17            Plan - 08/09/17 1419    Clinical Impression Statement  Pt continues to require cues to refocus to task. Pt demonstrated progress, as she was able to txf with less assist and did not experience incr. pain during HEP today. Pt also noted to use B hip adductor muscles to squeeze coffee thermos while propeling w/c with BUEs, she was also able to perform B hamstring curls to propel w/c with UE assist. PT will discuss dry needling with OP ortho PT. Pt would continue to benefit from skilled PT to improve safety during functional mobility.     Rehab Potential  Good     Clinical Impairments Affecting Rehab Potential  Hodgkin's lymphoma 1987 (46yo) s/p mantle radiation & chemotherapy (currently in remission), Breast CA 2000 s/p bilateral masectomies with skin recurrence, recent cervical CA, malignant neoplasms of thyroid gland, anxiety disorder, ADHD, depression, pituitary adenoma s/p adenomectomy, dyspnea     PT Frequency  3x / week 3x/wk for 8 weeks, then 2x/wk for 4 weeks    PT Duration  12 weeks    PT Treatment/Interventions  ADLs/Self Care Home Management;DME Instruction;Gait training;Stair training;Functional mobility training;Therapeutic activities;Therapeutic exercise;Balance training;Neuromuscular re-education;Patient/family education;Wheelchair mobility training;Vestibular    PT Next Visit Plan  Amb. in // bars and  Standing frame with UE & trunk activiites. Check to see if we can schedule OP ortho appt (1x/week at ortho and 2x/week at neuro) to address thoracic pain/tightness    Consulted and Agree with Plan of Care  Patient;Family member/caregiver    Family Member Consulted  husband       Patient will benefit from skilled therapeutic intervention in order to improve the following deficits and impairments:  Abnormal gait, Decreased activity tolerance, Decreased balance,  Decreased endurance, Decreased knowledge of use of DME, Decreased mobility, Decreased strength, Pain  Visit Diagnosis: Paraplegia, incomplete (HCC)  Muscle weakness (generalized)  Other abnormalities of gait and mobility  Unsteadiness on feet  Pain in thoracic spine     Problem List Patient Active Problem List   Diagnosis Date Noted  . CHF with right heart failure ([place]) 04/17/2017  . Edema 11/16/2014  . Dizziness 11/16/2014  . Hyperlipidemia 03/09/2014  . Hodgkin lymphoma ([place])   . Breast cancer ([place])   . Thyroid cancer ([place])   . Adrenal insufficiency ([place])   . Tachycardia   . Heart palpitations   . Chest discomfort   . Hypertension   . Raynaud phenomenon   .  Appendicitis   . GERD (gastroesophageal reflux disease)     Miller,Jennifer L 08/09/2017, 2:23 PM  [facility] [address] [place], [place], [address] Phone: [phone]   Fax:  [phone]  Name: [name] MRN: [id number] Date of Birth: 1970/11/11  Geoffry Paradise, PT,DPT 08/09/17 2:26 PM Phone: [phone] Fax: [phone]

## 2017-08-13 ENCOUNTER — Ambulatory Visit: Payer: PRIVATE HEALTH INSURANCE | Admitting: Physical Therapy

## 2017-08-14 ENCOUNTER — Ambulatory Visit: Payer: PRIVATE HEALTH INSURANCE | Admitting: Physical Therapy

## 2017-08-14 ENCOUNTER — Encounter: Payer: Self-pay | Admitting: Physical Therapy

## 2017-08-14 DIAGNOSIS — G8222 Paraplegia, incomplete: Secondary | ICD-10-CM

## 2017-08-14 DIAGNOSIS — R2681 Unsteadiness on feet: Secondary | ICD-10-CM

## 2017-08-14 DIAGNOSIS — R2689 Other abnormalities of gait and mobility: Secondary | ICD-10-CM

## 2017-08-14 DIAGNOSIS — M6281 Muscle weakness (generalized): Secondary | ICD-10-CM

## 2017-08-14 NOTE — Addendum Note (Signed)
Addended by: Isaias Cowman on: 08/14/2017 11:13 PM   Modules accepted: Orders

## 2017-08-14 NOTE — Therapy (Signed)
[facility] [address] [place], [place], [address] Phone: [phone]   Fax:  [phone]  Physical Therapy Treatment  Patient Details  Name: [name] MRN: [id number] Date of Birth: 12-14-1970 Referring Provider: Terese Door, MD   Encounter Date: 08/14/2017  PT End of Session - 08/14/17 2255    Visit Number  5    Number of Visits  33    Date for PT Re-Evaluation  10/25/17    Authorization Type  [employer]    PT Start Time  1230    PT Stop Time  1315    PT Time Calculation (min)  45 min    Equipment Utilized During Treatment  Gait belt    Activity Tolerance  Patient tolerated treatment well;Patient limited by fatigue    Behavior During Therapy  [facility] for tasks assessed/performed       Past Medical History:  Diagnosis Date  . Addison's disease ([place])   . Adrenal insufficiency ([place])   . Aortic stenosis   . Aortic stenosis   . Appendicitis 12/19/09  . Breast cancer ([place])    STATUS POST BILATERAL MASTECTOMY. STATUS POST RECONSTRUCTION. SHE HAD SILICONE BREAST IMPLANTS AND THE LEFT IMPLANT IS LEAKING SLIGHTLY  . Chest pain   . CHF with right heart failure ([place]) 04/17/2017  . GERD (gastroesophageal reflux disease)   . Hodgkin lymphoma ([facility])    STATUS POST MANTLE RADIATION  . Hypertension   . Osteoporosis   . Palpitations   . Raynaud phenomenon   . SVT (supraventricular tachycardia) ([place])   . Tachycardia   . Thyroid cancer ([place])    STATUS POST SURGICAL REMOVAL-CURRENT ON THYROID REPLACEMENT    Past Surgical History:  Procedure Laterality Date  . ABDOMINAL HYSTERECTOMY    . APPENDECTOMY    . CARDIAC CATHETERIZATION  05/18/09   NORMAL CATH  . PITUITARY SURGERY      There were no vitals filed for this visit.  Subjective Assessment - 08/14/17 1236    Subjective  She wants dry needling still. Her oncologist 2 new spots on liver with high intensity.  She fell out of w/c Friday night trying to loading  laundry, hitting face, no injuries.     Pertinent History  Hodgkin's lymphoma 1987 (46yo) s/p mantle radiation & chemotherapy (currently in remission), Breast CA 2000 s/p bilateral masectomies with skin recurrence, recent cervical CA, malignant neoplasms of thyroid gland, anxiety disorder, ADHD, depression, pituitary adenoma s/p adenomectomy, dyspnea     Limitations  Lifting;Standing;Walking;House hold activities    Patient Stated Goals  Get use of legs back and out of w/c.     Currently in Pain?  Yes    Pain Score  2  in last week, worst 8/10, best 1/10    Pain Location  Back    Pain Orientation  Right;Left;Lower    Pain Descriptors / Indicators  Aching;Spasm    Pain Type  Acute pain    Pain Onset  1 to 4 weeks ago    Pain Frequency  Intermittent    Aggravating Factors   prolonged position    Pain Relieving Factors  stretches & massage                      [facility] Adult PT Treatment/Exercise - 08/14/17 1230      Transfers   Transfers  Sit to Stand;Stand to Sit    Sit to Stand  4: Min guard;With upper extremity assist;From chair/3-in-1;With armrests  to parallel bars    Sit to Stand Details (indicate cue type and reason)  3 reps, excessive UE use but does engage LEs    Stand to Sit  4: Min guard;With upper extremity assist;To chair/3-in-1;With armrests from parallel bars    Stand to Sit Details  3 reps, uses UEs on parallel bars to control descent    Comments  --      Ambulation/Gait   Ambulation/Gait  Yes    Ambulation/Gait Assistance  4: Min assist    Ambulation/Gait Assistance Details  PT placed pillow cases under feet to enable assistance with advancing LEs. Manual & verbal cues on step length, weight shift over stance limb, advancing UEs and upright posture.  Patient continues to use excessive UE support.     Ambulation Distance (Feet)  5 Feet 5' X 2    Assistive device  Parallel bars    Gait Pattern  Decreased stride length;Decreased weight shift to right;Decreased  weight shift to left;Decreased dorsiflexion - left;Decreased dorsiflexion - right;Decreased hip/knee flexion - right;Decreased hip/knee flexion - left;Shuffle;Poor foot clearance - left;Poor foot clearance - right;Step-through pattern    Ambulation Surface  Indoor;Level    Gait Comments  Patient fatigues quickly with w/c following to sit.       Self-Care   Self-Care  Other Self-Care Comments    Other Self-Care Comments   PT instructed in proper elevation to decrease LE edema.       Knee/Hip Exercises: Aerobic   Nustep  Level 5 with BUEs & BLEs craddled for support: 5 mintues with verbal cues to focus on LE extension movement               PT Short Term Goals - 08/14/17 2256      PT SHORT TERM GOAL #1   Title  Patient demonstrates understanding of initial HEP. (All STGs target date: 08/30/2017)    Time  4    Period  Weeks    Status  On-going    Target Date  08/30/17      PT SHORT TERM GOAL #2   Title  Sit to/from stand w/c to parallel bars with supervision.     Time  4    Period  Weeks    Status  On-going    Target Date  08/30/17      PT SHORT TERM GOAL #3   Title  Patient able to tolerate standing in parallel bars & perform head turns for 2 minutes with supervision.     Time  4    Period  Weeks    Status  On-going    Target Date  08/30/17      PT SHORT TERM GOAL #4   Title  Patient ambulates 68' with LRAD with modA.     Time  4    Period  Weeks    Status  On-going    Target Date  08/30/17        PT Long Term Goals - 08/14/17 2257      PT LONG TERM GOAL #1   Title  Patient is independent with ongoing HEP / fitness plan.  (All LTGs Target Date: 10/25/2017)    Time  12    Period  Weeks    Status  On-going    Target Date  10/25/17      PT LONG TERM GOAL #2   Title  Patient performs sit to/from stand from chairs with armrests to assistive device modified independent.  Baseline  Due to conversion disorder diagnosis, this LTG may be upgraded / changed  depending on patient response to treatment.     Time  12    Period  Weeks    Status  On-going    Target Date  10/25/17      PT LONG TERM GOAL #3   Title  Patient performs standing ADLs with arm support reaching 10", pick up objects from floor, turning trunk & head to scan and managing clothes modified independent.     Baseline  Due to conversion disorder diagnosis, this LTG may be upgraded / changed depending on patient response to treatment.     Time  12    Period  Weeks    Status  On-going    Target Date  10/25/17      PT LONG TERM GOAL #4   Title  Patient ambulates 28' with LRAD with supervision.     Baseline  Due to conversion disorder diagnosis, this LTG may be upgraded / changed depending on patient response to treatment.     Time  12    Period  Weeks    Status  On-going    Target Date  10/25/17            Plan - 08/14/17 2258    Clinical Impression Statement  Patient continues to fatigue quickly with activities. Patient was able to engage LEs with NuStep, transfers, standing & gait but requires excessive UE assist. Patient has not seen oncologist but states family is "taking a last vacation."    Rehab Potential  Good    Clinical Impairments Affecting Rehab Potential  Hodgkin's lymphoma 1987 (46yo) s/p mantle radiation & chemotherapy (currently in remission), Breast CA 2000 s/p bilateral masectomies with skin recurrence, recent cervical CA, malignant neoplasms of thyroid gland, anxiety disorder, ADHD, depression, pituitary adenoma s/p adenomectomy, dyspnea     PT Frequency  3x / week 3x/wk for 8 weeks, then 2x/wk for 4 weeks    PT Duration  12 weeks    PT Treatment/Interventions  ADLs/Self Care Home Management;DME Instruction;Gait training;Stair training;Functional mobility training;Therapeutic activities;Therapeutic exercise;Balance training;Neuromuscular re-education;Patient/family education;Wheelchair mobility training;Vestibular    PT Next Visit Plan  Amb. in // bars  and  Standing frame with UE & trunk activiites. Activities / exercises to engage LEs.     Consulted and Agree with Plan of Care  Patient;Family member/caregiver    Family Member [employer], friend       Patient will benefit from skilled therapeutic intervention in order to improve the following deficits and impairments:  Abnormal gait, Decreased activity tolerance, Decreased balance, Decreased endurance, Decreased knowledge of use of DME, Decreased mobility, Decreased strength, Pain  Visit Diagnosis: Paraplegia, incomplete (HCC)  Muscle weakness (generalized)  Other abnormalities of gait and mobility  Unsteadiness on feet     Problem List Patient Active Problem List   Diagnosis Date Noted  . CHF with right heart failure ([place]) 04/17/2017  . Edema 11/16/2014  . Dizziness 11/16/2014  . Hyperlipidemia 03/09/2014  . Hodgkin lymphoma ([place])   . Breast cancer ([place])   . Thyroid cancer ([place])   . Adrenal insufficiency ([place])   . Tachycardia   . Heart palpitations   . Chest discomfort   . Hypertension   . Raynaud phenomenon   . Appendicitis   . GERD (gastroesophageal reflux disease)     Zyden Suman PT, DPT 08/14/2017, 11:03 PM  [facility] [address] [place],  [place], [address] Phone: [phone]   Fax:  [phone]  Name: [name] MRN: [id number] Date of Birth: December 13, 1970

## 2017-08-15 ENCOUNTER — Ambulatory Visit: Payer: PRIVATE HEALTH INSURANCE | Admitting: Physical Therapy

## 2017-08-15 ENCOUNTER — Encounter: Payer: Self-pay | Admitting: Cardiovascular Disease

## 2017-08-15 ENCOUNTER — Ambulatory Visit: Payer: PRIVATE HEALTH INSURANCE | Admitting: Cardiovascular Disease

## 2017-08-15 VITALS — BP 122/70 | HR 97 | Ht 65.5 in | Wt 176.0 lb

## 2017-08-15 DIAGNOSIS — R002 Palpitations: Secondary | ICD-10-CM

## 2017-08-15 DIAGNOSIS — R2689 Other abnormalities of gait and mobility: Secondary | ICD-10-CM

## 2017-08-15 DIAGNOSIS — G8222 Paraplegia, incomplete: Secondary | ICD-10-CM

## 2017-08-15 DIAGNOSIS — M6281 Muscle weakness (generalized): Secondary | ICD-10-CM

## 2017-08-15 DIAGNOSIS — R2681 Unsteadiness on feet: Secondary | ICD-10-CM

## 2017-08-15 LAB — BASIC METABOLIC PANEL
BUN/Creatinine Ratio: 19 (ref 9–23)
BUN: 15 mg/dL (ref 6–24)
CO2: 22 mmol/L (ref 20–29)
Calcium: 9.7 mg/dL (ref 8.7–10.2)
Chloride: 103 mmol/L (ref 96–106)
Creatinine, Ser: 0.79 mg/dL (ref 0.57–1.00)
GFR, EST AFRICAN AMERICAN: 104 mL/min/{1.73_m2} (ref >59)
GFR, EST NON AFRICAN AMERICAN: 90 mL/min/{1.73_m2} (ref >59)
Glucose: 106 mg/dL — ABNORMAL HIGH (ref 65–99)
POTASSIUM: 4.3 mmol/L (ref 3.5–5.2)
SODIUM: 141 mmol/L (ref 134–144)

## 2017-08-15 NOTE — Progress Notes (Signed)
Cardiology Office Note   Date:  08/15/2017   ID:  [name], DOB 1971-05-29, MRN [id number]  PCP:  Maylon Peppers, MD  Cardiologist:   Mertie Moores, MD   Chief Complaint  Patient presents with  . Follow-up    HTN, Right heart CHF   Problem List:  1. Palpitations/premature ventricular contractions 2. history of Hodgkin's lymphoma-status post mantle radiation 3. History of breast cancer-status post Reconstruction 4. History of cervical cancer 5. 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 2015 for regrowth of tumor.  6. History of thyroidectomy - hx of thyroid cancer  7. Raynaud's phenomenon 8. Appendectomy 9. History chest pains-normal heart catheterization, normal TEE 10. history of adrenal insufficiency  11. Hyperlipidemia   History of Present Illness:  [name] is done very well since I last saw her about a year ago. She has had some occasional palpitations that are typically controlled with metoprolol. She's not had any episodes of chest pain. She's been able to stop all of her steroids. Her adrenal glands have gradually improved and her now producing cortisol.  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 She is still working at [employer].   Feb 24, 2013:  She has done well since I last saw her . She has had her breast implants replaced ( old ones were leaking). Her costochondritis has resolved. She has not been lifting.   She has run in 6 5K races this year. She is playing tennis. She is still in [employer] school. She is doing her orthopedic rotation.   Nov. 11, 2014:  Her HR has been high. She has a head ache frequently. She started Adderall recently.  The adderall has help with her focus but she feels much worse in it. She has been on the Adderall for 2 months and the tachycardia started about 1 week ago.  She was doing cycle  classes 3 times a week. She is exercising regularly and for the past week, her HR is extremely fast. She has gone into menapause and has lots of hot flashes.   Feb. 23, 2015:  She has been having some recent episodes of tachycardia. We increased her metoprolol at the last visit. We performed an echocardiogram which revealed  Left ventricle: The cavity size was normal. Systolic function was normal. The estimated ejection fraction was in the range of 55% to 60%. Wall motion was normal; there were no regional wall motion abnormalities. Left ventricular diastolic function parameters were normal. - Aortic valve: Trivial regurgitation. - Mitral valve: Trivial regurgitation  She tried to stop her Adderall ( did not do well with that). She decreased her dose slightly and she is not having much tachycardia. She has been   She has been found to have some osteoperosis and osteopenia.   March 09, 2014:  [name] is doing ok.. No recent cardiac problems. She has been very active and is feeling great. Playing tennis on a regular basis.     Feb. 17, 2016:   [name] is a 46 y.o. female who presents for for evaluation of palpitations , hypotension. Echo yesterday  Left ventricle: The cavity size was normal. Systolic function was normal. The estimated ejection fraction was in the range of 55% to 60%. Wall motion was normal; there were no regional wall motion abnormalities. - Aortic valve: There was trivial regurgitation. - Mitral valve: Calcified annulus.  Labs were ordered yesterday but she was not able to get them. Continues to have palpitations today She is off her Estrogen at this point.  She is back on the Toprol - skipped 2 days.  Has been taking the PRN propranolol  Not feeling well and has not been eating or drinking well. We given 1 dose of lasix   December 15, 2014:  She has continued to have palpitations  - last 20-30 seconds .  HR has remained fairly high. Has been  working out regularly .  These are going ok.    Feb 03, 2015: [name] continues to have palpitations . She wore a monitor but actually only wore is for about 1/2 of the time ( she was at [employer]) We have discussed implantable loop.  I have some concerns about doing any procedures on her .  She has only been taking the toprol 50 mg a day instead of 75.   May 17, 2015:  [name] is seen today for follow up. She has a history of hyperlipidemia and we sent her to lipid clinic. She is tolerating the Crestor very well.   Still having palpitations but learning how to manage .  Oct. 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 Has been seen at [facility] and is now at [facility]  She has sent scans to [facility]. Is on steroids to shrink tumor, has gained lots of weight.  Is hoping to be able to get a bx so that she have this tumor treated.   She is now on home O2.  O2 sats dropped to 78 while sleeping on room air.    O2 sats on 2.5 liters are good. She uses 4 lpm at night .   Is having more palpitations. Would like a refill for propranolol'   April 18, 2017:  [name] is seen today  Is still on home O2.,  Has chronic respiratory failure ( unclear etiology at this point )   Has RV failure.   Had a right heart cath.  PA pressures were only minimally elevated.  Had significant leg swelling   Has been on steroids.  Has developed pre-diabetes  Nov. 15,, 2018  In Sept 25,she woke up from a nap Ad tingling and numbness in her legs , had no strength in her legs . 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   2nd MRI of back showed a lesion between T10 -T 11.   Nothing that truly showed compression.  Was transferred to rehab unit in [place].    Has regained some motor ability I her lower abd.    Ruptured her left breast implant using her arms in rehab  Scheduled to have her implants removed later this month .   Still on home O2,  Palpitations have been well controlled.    Past Medical History:  Diagnosis Date  . Addison's disease ([place])   . Adrenal insufficiency ([place])   . Aortic stenosis   . Aortic stenosis   . Appendicitis 12/19/09  . Breast cancer ([place])    STATUS POST BILATERAL MASTECTOMY. STATUS POST RECONSTRUCTION. SHE HAD SILICONE BREAST IMPLANTS AND THE LEFT IMPLANT IS LEAKING SLIGHTLY  . Chest pain   . CHF with right heart failure ([place])  04/17/2017  . GERD (gastroesophageal reflux disease)   . Hodgkin lymphoma (HCC)    STATUS POST MANTLE RADIATION  . Hypertension   . Osteoporosis   . Palpitations   . Raynaud phenomenon   . SVT (supraventricular tachycardia) ([place])   . Tachycardia   . Thyroid cancer ([place])    STATUS POST SURGICAL REMOVAL-CURRENT ON THYROID REPLACEMENT    Past Surgical History:  Procedure Laterality Date  . ABDOMINAL HYSTERECTOMY    . APPENDECTOMY    . CARDIAC CATHETERIZATION  05/18/09   NORMAL CATH  . PITUITARY SURGERY       Current Outpatient Medications  Medication Sig Dispense Refill  . albuterol (VENTOLIN HFA) 108 (90 Base) MCG/ACT inhaler Inhale 2 puffs into the lungs every 6 (six) hours as needed for wheezing or shortness of breath.    . Alum & Mag Hydroxide-Simeth (GI COCKTAIL) SUSP suspension Take 30 mLs daily as needed by mouth for indigestion (BLEEDING ULCERS). Shake well.    . amphetamine-dextroamphetamine (ADDERALL XR) 25 MG 24 hr capsule Take 20 mg by mouth 2 (two) times daily.     . beclomethasone (QVAR) 80 MCG/ACT inhaler Inhale 2 puffs into the lungs 2 (two) times daily.    Marland Kitchen BREO ELLIPTA 200-25 MCG/INH AEPB INHALE 1 INHALATION INTO THE LUNGS ONCE DAILY.  11  . clonazePAM (KLONOPIN) 1 MG tablet Take 1 mg by mouth 3 (three) times daily.     . cyclophosphamide (CYTOXAN) 50 MG tablet Take 50 mg by mouth daily. Give on an empty  stomach 1 hour before or 2 hours after meals.    Marland Kitchen dexlansoprazole (DEXILANT) 60 MG capsule Take 60 mg by mouth daily.      . Diclofenac Potassium (CAMBIA) 50 MG PACK Take 1 packet by mouth 3 (three) times daily as needed. AS NEEDED FOR HEADACHES    . escitalopram (LEXAPRO) 20 MG tablet Take 30 mg by mouth daily.   1  . estradiol (ESTRACE) 2 MG tablet Take 2 mg by mouth daily.  11  . ferrous sulfate 325 (65 FE) MG EC tablet Take by mouth.    . Ferrous Sulfate Dried (SLOW RELEASE IRON) 45 MG TBCR Take 1 tablet at bedtime by mouth.    . fluticasone furoate-vilanterol (BREO ELLIPTA) 200-25 MCG/INH AEPB Inhale 1 puff into the lungs daily.    . furosemide (LASIX) 20 MG tablet Take 20 mg by mouth daily.    . hydrocortisone (CORTEF) 10 MG tablet Take 5 mg by mouth 2 (two) times daily.     Marland Kitchen ibuprofen (ADVIL,MOTRIN) 200 MG tablet Take 600 mg by mouth every 6 (six) hours as needed for moderate pain.    Marland Kitchen levothyroxine (SYNTHROID, LEVOTHROID) 100 MCG tablet Take 100 mcg by mouth daily.      . metoprolol succinate (TOPROL-XL) 25 MG 24 hr tablet TAKE 3 TABLETS (75MG) DAILY. TAKE WITH OR IMMEDIATELY FOLLOWING A MEAL. 270 tablet 2  . rosuvastatin (CRESTOR) 10 MG tablet TAKE 1/2 - 1 TABLET BY MOUTH DAILY. OVERDUE FOR FOLLOW UP. CALL AND SCHEDULE [phone] - 1ST ATTEMPT 30 tablet 10  . sucralfate (CARAFATE) 1 g tablet Take 1 g as needed by mouth (FOR ULCERS).    . SUMAtriptan (IMITREX) 100 MG tablet Take 100 mg by mouth.    . topiramate (TOPAMAX) 100 MG tablet Take 100 mg by mouth.    . traMADol (ULTRAM) 50 MG tablet Take 50 mg by mouth daily as needed. AS NEEDED FOR PAIN    . zolpidem (AMBIEN  CR) 12.5 MG CR tablet Take 12.5 mg by mouth at bedtime.      . potassium chloride (K-DUR) 10 MEQ tablet Take 1 tablet (10 mEq total) by mouth daily. 90 tablet 3   No current facility-administered medications for this visit.     Allergies:   Cymbalta [duloxetine hcl]; Buprenorphine hcl; Compazine; Dilaudid  [hydromorphone hcl]; Hydromorphone; Metoclopramide; Morphine and related; Na ferric gluc cplx in sucrose; Promethazine hcl; Metoclopramide hcl; Ondansetron; and Tegaderm ag mesh [silver]    Social History:  The patient  reports that  has never smoked. she has never used smokeless tobacco. She reports that she drinks alcohol. She reports that she does not use drugs.   Family History:  The patient's Family history is unknown by patient.    ROS:  Please see the history of present illness.         All other systems are reviewed and negative.   Physical Exam: Blood pressure 122/70, pulse 97, height 5' 5.5" (1.664 m), weight 176 lb (79.8 kg), SpO2 97 %.  GEN:   Middle-aged female.  Mild respiratory distress.  She is wearing home oxygen.  She was examined in the wheelchair. HEENT: Normal NECK: No JVD; No carotid bruits LYMPHATICS: No lymphadenopathy CARDIAC: RR .  Soft systolic murmur.  She has tenderness in her left axilla. RESPIRATORY:  Clear to auscultation without rales, wheezing or rhonchi  ABDOMEN: Soft, non-tender, non-distended MUSCULOSKELETAL:  No edema; No deformity  SKIN: Warm and dry NEUROLOGIC:  Alert and oriented x 3   EKG:  EKG is ordered today. #15, 2018: Normal sinus rhythm at 87.  Occasional fusion complexes.  Otherwise normal.    Recent Labs: No results found for requested labs within last 8760 hours.    Lipid Panel    Component Value Date/Time   CHOL 184 05/16/2015 0933   CHOL 207 (H) 12/08/2013 0823   TRIG 107.0 05/16/2015 0933   TRIG 96 12/08/2013 0823   HDL 63.00 05/16/2015 0933   HDL 57 12/08/2013 0823   CHOLHDL 3 05/16/2015 0933   VLDL 21.4 05/16/2015 0933   LDLCALC 100 (H) 05/16/2015 0933   LDLCALC 131 (H) 12/08/2013 0823      Wt Readings from Last 3 Encounters:  08/15/17 176 lb (79.8 kg)  04/17/17 173 lb (78.5 kg)  06/16/15 146 lb (66.2 kg)      Other studies Reviewed: Additional studies/ records that were reviewed today include:  . Review of the above records demonstrates:    ASSESSMENT AND PLAN:  Leg paralysis:  [name] presents with leg paralysis.  She is at risk for developing a DVT although there is no evidence of DVT at present.  I will discuss with neurosurgical colleagues whether not long-term Eliquis or Xarelto is indicated.  The risk apparently is fairly high after spinal cord injury or the onset of paralysis but then decreases over time. The research that I performed suggest that long-term anticoagulation is not needed.  It is already been several months since her onset of paralysis so her risk is somewhat diminished already.  I recommended that she get a lounge Dr. leg rest pillow to help elevate her legs to minimize stasis.  -Ruptured left breast implant: She will need to have her breast implants removed.  She is stable from a cardiac standpoint.  She is at low risk for the surgery.   -  Right heart failure:   Unclear etiology .  - Palpitations/premature ventricular contractions -    - history  of Hodgkin's lymphoma-status post mantle radiation   - History of breast cancer-status post Reconstruction Has recurrent breast cancer.   With mets to lungs, liver and  brain . Is going to [facility] treatments.    4. History of cervical cancer 5. 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 2015 for regrowth of tumor.  6. History of thyroidectomy - hx of thyroid cancer  7. Raynaud's phenomenon 8. Appendectomy 9. History chest pains-normal heart catheterization, normal TEE,,  She's having intermittent episodes of chest palpitations and chest pains. These chest pains are noncardiac. 10. history of adrenal insufficiency  11.  Palpitations:    I'll see her again in the office in 6  months.  Current medicines are reviewed at length with the patient today.  The patient does not have concerns regarding medicines.  The following changes have been  made:  no change  Disposition:   FU with me in me in 3  months.    Signed, Mertie Moores, MD  08/15/2017 11:42 AM    [facility] [place], [place], [place]  [address] Phone: [phone]; Fax: [phone]

## 2017-08-15 NOTE — Patient Instructions (Addendum)
Medication Instructions:  Your physician recommends that you continue on your current medications as directed. Please refer to the Current Medication list given to you today.   Labwork: TODAY - basic metabolic panel    Testing/Procedures: None Ordered   Follow-Up: Your physician recommends that you schedule a follow-up appointment in: 3 months with Dr. Acie Fredrickson   If you need a refill on your cardiac medications before your next appointment, please call your pharmacy.   Thank you for choosing [facility]! Christen Bame, RN [phone]     For your  leg edema you  should do  the following 1. Leg elevation - I recommend the Lounge Dr. Leg rest.  See below for details  2. Salt restriction  -  Use potassium chloride instead of regular salt as a salt substitute. 3. Walk regularly 4. Compression hose - [facility] 5. Weight loss     Go to [email or web address] [place] . Com

## 2017-08-15 NOTE — Therapy (Signed)
[place] [address] [place], [place], [address] Phone: [phone]   Fax:  [phone]  Physical Therapy Treatment  Patient Details  Name: [name] MRN: [id number] Date of Birth: May 04, 1971 Referring Provider: Terese Door, MD   Encounter Date: 08/15/2017  PT End of Session - 08/15/17 0934    Visit Number  6    Number of Visits  33    Date for PT Re-Evaluation  10/25/17    Authorization Type  [employer]    PT Start Time  0902    PT Stop Time  0930    PT Time Calculation (min)  28 min    Activity Tolerance  Patient tolerated treatment well;Patient limited by fatigue;Patient limited by pain    Behavior During Therapy  [facility] for tasks assessed/performed       Past Medical History:  Diagnosis Date  . Addison's disease ([place])   . Adrenal insufficiency ([place])   . Aortic stenosis   . Aortic stenosis   . Appendicitis 12/19/09  . Breast cancer ([place])    STATUS POST BILATERAL MASTECTOMY. STATUS POST RECONSTRUCTION. SHE HAD SILICONE BREAST IMPLANTS AND THE LEFT IMPLANT IS LEAKING SLIGHTLY  . Chest pain   . CHF with right heart failure ([place]) 04/17/2017  . GERD (gastroesophageal reflux disease)   . Hodgkin lymphoma (HCC)    STATUS POST MANTLE RADIATION  . Hypertension   . Osteoporosis   . Palpitations   . Raynaud phenomenon   . SVT (supraventricular tachycardia) ([place])   . Tachycardia   . Thyroid cancer ([place])    STATUS POST SURGICAL REMOVAL-CURRENT ON THYROID REPLACEMENT    Past Surgical History:  Procedure Laterality Date  . ABDOMINAL HYSTERECTOMY    . APPENDECTOMY    . CARDIAC CATHETERIZATION  05/18/09   NORMAL CATH  . PITUITARY SURGERY      There were no vitals filed for this visit.  Subjective Assessment - 08/15/17 0903    Subjective  Pt sore after sleeping in recliner last night. She requested not to stand or use UEs excessively today.     Pertinent History  Hodgkin's lymphoma 1987 (46yo) s/p mantle  radiation & chemotherapy (currently in remission), Breast CA 2000 s/p bilateral masectomies with skin recurrence, recent cervical CA, malignant neoplasms of thyroid gland, anxiety disorder, ADHD, depression, pituitary adenoma s/p adenomectomy, dyspnea     Limitations  Lifting;Standing;Walking;House hold activities    Patient Stated Goals  Get use of legs back and out of w/c.     Currently in Pain?  Yes    Pain Score  6     Pain Location  Shoulder    Pain Orientation  Left    Pain Descriptors / Indicators  Constant    Pain Type  Acute pain    Pain Radiating Towards  Radiating up neck    Aggravating Factors   sleeping on it in recliner         NMR:  Due to 6/10 pain in left shoulder, PT used standing frame to work on standing activities with minimal UE support. Patient tolerated standing in frame for 10 minutes. To minimize UE support in standing with frame, patient performed alternate reaching up, alternate reaching forward;progressed to positioning hands closer to trunk to facilitate upright upper trunk posture & move fingers as playing piano.  Pt was instructed to shift chest away from chest plate, to improve posture and increase weight bearing through LE's. VC's needed for proper technique and posture. Pt  had minimal limitation from fatigue but anxiety before each activity. No reports of increased pain or issues.      [facility] Adult PT Treatment/Exercise - 08/15/17 0001      Transfers   Transfers  --    Sit to Stand  --    Sit to Stand Details  --    Sit to Stand Details (indicate cue type and reason)  --    Stand to Sit  --    Stand to Sit Details (indicate cue type and reason)  --    Stand to Sit Details  --    Lateral/Scoot Transfers  5: Supervision    Lateral/Scoot Transfer Details (indicate cue type and reason)  Pt performed transfer with Supervision from w/c to NuStep to w/c, without difficulties.    Comments  --      Knee/Hip Exercises: Aerobic   Nustep  Level 1 with BUEs &  BLEs cradled for support: 5 minutes with verbal cues to focus on LE extension movement               PT Short Term Goals - 08/14/17 2256      PT SHORT TERM GOAL #1   Title  Patient demonstrates understanding of initial HEP. (All STGs target date: 08/30/2017)    Time  4    Period  Weeks    Status  On-going    Target Date  08/30/17      PT SHORT TERM GOAL #2   Title  Sit to/from stand w/c to parallel bars with supervision.     Time  4    Period  Weeks    Status  On-going    Target Date  08/30/17      PT SHORT TERM GOAL #3   Title  Patient able to tolerate standing in parallel bars & perform head turns for 2 minutes with supervision.     Time  4    Period  Weeks    Status  On-going    Target Date  08/30/17      PT SHORT TERM GOAL #4   Title  Patient ambulates 67' with LRAD with modA.     Time  4    Period  Weeks    Status  On-going    Target Date  08/30/17        PT Long Term Goals - 08/14/17 2257      PT LONG TERM GOAL #1   Title  Patient is independent with ongoing HEP / fitness plan.  (All LTGs Target Date: 10/25/2017)    Time  12    Period  Weeks    Status  On-going    Target Date  10/25/17      PT LONG TERM GOAL #2   Title  Patient performs sit to/from stand from chairs with armrests to assistive device modified independent.     Baseline  Due to conversion disorder diagnosis, this LTG may be upgraded / changed depending on patient response to treatment.     Time  12    Period  Weeks    Status  On-going    Target Date  10/25/17      PT LONG TERM GOAL #3   Title  Patient performs standing ADLs with arm support reaching 10", pick up objects from floor, turning trunk & head to scan and managing clothes modified independent.     Baseline  Due to conversion disorder diagnosis, this LTG may be upgraded /  changed depending on patient response to treatment.     Time  12    Period  Weeks    Status  On-going    Target Date  10/25/17      PT LONG TERM GOAL  #4   Title  Patient ambulates 67' with LRAD with supervision.     Baseline  Due to conversion disorder diagnosis, this LTG may be upgraded / changed depending on patient response to treatment.     Time  12    Period  Weeks    Status  On-going    Target Date  10/25/17            Plan - 08/15/17 1358    Clinical Impression Statement  Patient had moderate limitation from pain in RUE. Pt session took place in standing frame with minimal use of UE, and focused on improving posture and bearing weight through bilat LE. Patient will continue to benefit from further PT sessions to improve safety during functional mobility.     Rehab Potential  Good    Clinical Impairments Affecting Rehab Potential  Hodgkin's lymphoma 1987 (46yo) s/p mantle radiation & chemotherapy (currently in remission), Breast CA 2000 s/p bilateral masectomies with skin recurrence, recent cervical CA, malignant neoplasms of thyroid gland, anxiety disorder, ADHD, depression, pituitary adenoma s/p adenomectomy, dyspnea     PT Frequency  3x / week    PT Duration  12 weeks    PT Treatment/Interventions  ADLs/Self Care Home Management;DME Instruction;Gait training;Stair training;Functional mobility training;Therapeutic activities;Therapeutic exercise;Balance training;Neuromuscular re-education;Patient/family education;Wheelchair mobility training;Vestibular    PT Next Visit Plan  Work towards [employer]. Continue with standing frame activities to engage trunk and LE's. Ambulation in parallel bars.    Consulted and Agree with Plan of Care  Patient;Family member/caregiver    Family Member Consulted  husband       Patient will benefit from skilled therapeutic intervention in order to improve the following deficits and impairments:  Abnormal gait, Decreased activity tolerance, Decreased balance, Decreased endurance, Decreased knowledge of use of DME, Decreased mobility, Decreased strength, Pain  Visit Diagnosis: Paraplegia, incomplete  (HCC)  Muscle weakness (generalized)  Other abnormalities of gait and mobility  Unsteadiness on feet     Problem List Patient Active Problem List   Diagnosis Date Noted  . Right heart failure ([place]) 04/17/2017  . Edema 11/16/2014  . Dizziness 11/16/2014  . Hyperlipidemia 03/09/2014  . Hodgkin lymphoma ([place])   . Breast cancer ([place])   . Thyroid cancer ([place])   . Adrenal insufficiency ([place])   . Tachycardia   . Heart palpitations   . Chest discomfort   . Hypertension   . Raynaud phenomenon   . Appendicitis   . GERD (gastroesophageal reflux disease)    Andria Meuse, SPTA 08/15/2017, 12:38 PM  Jamey Reas, PT, DPT 08/16/2017, 5:29 AM  [facility] [address] [place], [place], [address] Phone: [phone]   Fax:  [phone]  Name: [name] MRN: [id number] Date of Birth: 04/25/1971

## 2017-08-19 ENCOUNTER — Ambulatory Visit: Payer: PRIVATE HEALTH INSURANCE | Admitting: Physical Therapy

## 2017-08-20 ENCOUNTER — Ambulatory Visit: Payer: PRIVATE HEALTH INSURANCE

## 2017-08-20 DIAGNOSIS — M546 Pain in thoracic spine: Secondary | ICD-10-CM

## 2017-08-20 DIAGNOSIS — M6281 Muscle weakness (generalized): Secondary | ICD-10-CM

## 2017-08-20 DIAGNOSIS — G8222 Paraplegia, incomplete: Secondary | ICD-10-CM | POA: Diagnosis not present

## 2017-08-20 DIAGNOSIS — R2689 Other abnormalities of gait and mobility: Secondary | ICD-10-CM

## 2017-08-20 NOTE — Therapy (Signed)
[facility] [address] [place], [place], [address] Phone: [phone]   Fax:  [phone]  Physical Therapy Treatment  Patient Details  Name: [name] MRN: [id number] Date of Birth: [birth year] Referring Provider: Terese Door, MD   Encounter Date: 08/20/2017  PT End of Session - 08/20/17 1340    Visit Number  7    Number of Visits  33    Date for PT Re-Evaluation  10/25/17    Authorization Type  [employer]    PT Start Time  1245 pt arrived late    PT Stop Time  1315    PT Time Calculation (min)  30 min    Activity Tolerance  Patient tolerated treatment well    Behavior During Therapy  [facility] for tasks assessed/performed       Past Medical History:  Diagnosis Date  . Addison's disease ([place])   . Adrenal insufficiency ([place])   . Aortic stenosis   . Aortic stenosis   . Appendicitis 12/19/09  . Breast cancer ([place])    STATUS POST BILATERAL MASTECTOMY. STATUS POST RECONSTRUCTION. SHE HAD SILICONE BREAST IMPLANTS AND THE LEFT IMPLANT IS LEAKING SLIGHTLY  . Chest pain   . CHF with right heart failure ([place]) 04/17/2017  . GERD (gastroesophageal reflux disease)   . Hodgkin lymphoma ([facility])    STATUS POST MANTLE RADIATION  . Hypertension   . Osteoporosis   . Palpitations   . Raynaud phenomenon   . SVT (supraventricular tachycardia) ([place])   . Tachycardia   . Thyroid cancer ([place])    STATUS POST SURGICAL REMOVAL-CURRENT ON THYROID REPLACEMENT    Past Surgical History:  Procedure Laterality Date  . ABDOMINAL HYSTERECTOMY    . APPENDECTOMY    . CARDIAC CATHETERIZATION  05/18/09   NORMAL CATH  . PITUITARY SURGERY      There were no vitals filed for this visit.  Subjective Assessment - 08/20/17 1246    Subjective  Pt reported she missed yesterday due to a sore on R inner thigh, she bought a barrier cream per PA friends advice and feels better. Pt states she should have an appt for a power w/c.     Pertinent History   Hodgkin's lymphoma 1987 (46yo) s/p mantle radiation & chemotherapy (currently in remission), Breast CA 2000 s/p bilateral masectomies with skin recurrence, recent cervical CA, malignant neoplasms of thyroid gland, anxiety disorder, ADHD, depression, pituitary adenoma s/p adenomectomy, dyspnea     Patient Stated Goals  Get use of legs back and out of w/c.     Currently in Pain?  Yes    Pain Score  2     Pain Location  -- Psoas/QL    Pain Descriptors / Indicators  Aching    Pain Type  Chronic pain    Pain Onset  More than a month ago    Pain Frequency  Intermittent    Aggravating Factors   different positions    Pain Relieving Factors  massage                      [facility] Adult PT Treatment/Exercise - 08/20/17 1324      Transfers   Transfers  Squat Pivot Transfers    Sit to Stand  6: Modified independent (Device/Increase time)    Squat Pivot Transfers  6: Modified independent (Device/Increase time)    Comments  Pt performed sq pivot w/c<>mat at MOD I level, as she demo'd proper and safe technique.  Exercises   Exercises  Other Exercises    Other Exercises   Seated with cues and demo for technique pt performed: 2-3sets of 10 reps with BUE: shoulder flexion to 90 degrees, shoulder abd. to 90 degrees, B scapular retraction, B shoulder ER all with blue band. Pt denied pain and all performed to improve strength and trunk stability.             PT Education - 08/20/17 1326    Education provided  Yes    Education Details  PT discussed w/c appt would have to be 1.5 hours with Vinnie Level (PT who performs w/c eval). Pt agreeable. PT updated shoulder HEP to blue band.     Person(s) Educated  Patient    Methods  Explanation;Demonstration;Verbal cues;Handout    Comprehension  Verbalized understanding;Returned demonstration       PT Short Term Goals - 08/14/17 2256      PT SHORT TERM GOAL #1   Title  Patient demonstrates understanding of initial HEP. (All STGs target date:  08/30/2017)    Time  4    Period  Weeks    Status  On-going    Target Date  08/30/17      PT SHORT TERM GOAL #2   Title  Sit to/from stand w/c to parallel bars with supervision.     Time  4    Period  Weeks    Status  On-going    Target Date  08/30/17      PT SHORT TERM GOAL #3   Title  Patient able to tolerate standing in parallel bars & perform head turns for 2 minutes with supervision.     Time  4    Period  Weeks    Status  On-going    Target Date  08/30/17      PT SHORT TERM GOAL #4   Title  Patient ambulates 34' with LRAD with modA.     Time  4    Period  Weeks    Status  On-going    Target Date  08/30/17        PT Long Term Goals - 08/14/17 2257      PT LONG TERM GOAL #1   Title  Patient is independent with ongoing HEP / fitness plan.  (All LTGs Target Date: 10/25/2017)    Time  12    Period  Weeks    Status  On-going    Target Date  10/25/17      PT LONG TERM GOAL #2   Title  Patient performs sit to/from stand from chairs with armrests to assistive device modified independent.     Baseline  Due to conversion disorder diagnosis, this LTG may be upgraded / changed depending on patient response to treatment.     Time  12    Period  Weeks    Status  On-going    Target Date  10/25/17      PT LONG TERM GOAL #3   Title  Patient performs standing ADLs with arm support reaching 10", pick up objects from floor, turning trunk & head to scan and managing clothes modified independent.     Baseline  Due to conversion disorder diagnosis, this LTG may be upgraded / changed depending on patient response to treatment.     Time  12    Period  Weeks    Status  On-going    Target Date  10/25/17      PT LONG TERM  GOAL #4   Title  Patient ambulates 20' with LRAD with supervision.     Baseline  Due to conversion disorder diagnosis, this LTG may be upgraded / changed depending on patient response to treatment.     Time  12    Period  Weeks    Status  On-going    Target  Date  10/25/17            Plan - 08/20/17 1340    Clinical Impression Statement  PT focused on seated therex to improve BUE strength and trunk stability while performing therex. PT did not perform activities in standing as pt reports she has a L upper quad sore, which MD is aware of sore. Pt denied pain during session. Continue with POC.     Rehab Potential  Good    Clinical Impairments Affecting Rehab Potential  Hodgkin's lymphoma 1987 (46yo) s/p mantle radiation & chemotherapy (currently in remission), Breast CA 2000 s/p bilateral masectomies with skin recurrence, recent cervical CA, malignant neoplasms of thyroid gland, anxiety disorder, ADHD, depression, pituitary adenoma s/p adenomectomy, dyspnea     PT Frequency  3x / week    PT Duration  12 weeks    PT Treatment/Interventions  ADLs/Self Care Home Management;DME Instruction;Gait training;Stair training;Functional mobility training;Therapeutic activities;Therapeutic exercise;Balance training;Neuromuscular re-education;Patient/family education;Wheelchair mobility training;Vestibular    PT Next Visit Plan  Check STGs. Continue with standing frame activities to engage trunk and LE's. Ambulation in parallel bars.    Consulted and Agree with Plan of Care  Patient;Family member/caregiver    Family Member Consulted  husband       Patient will benefit from skilled therapeutic intervention in order to improve the following deficits and impairments:  Abnormal gait, Decreased activity tolerance, Decreased balance, Decreased endurance, Decreased knowledge of use of DME, Decreased mobility, Decreased strength, Pain  Visit Diagnosis: Pain in thoracic spine  Muscle weakness (generalized)  Other abnormalities of gait and mobility     Problem List Patient Active Problem List   Diagnosis Date Noted  . Right heart failure ([place]) 04/17/2017  . Edema 11/16/2014  . Dizziness 11/16/2014  . Hyperlipidemia 03/09/2014  . Hodgkin lymphoma ([place])    . Breast cancer ([place])   . Thyroid cancer ([place])   . Adrenal insufficiency ([place])   . Tachycardia   . Heart palpitations   . Chest discomfort   . Hypertension   . Raynaud phenomenon   . Appendicitis   . GERD (gastroesophageal reflux disease)     Miller,Jennifer L 08/20/2017, 1:44 PM  [facility] [address] [place], [place], [address] Phone: [phone]   Fax:  [phone]  Name: [name] MRN: [id number] Date of Birth: Apr 05, 1971  Geoffry Paradise, PT,DPT 08/20/17 1:44 PM Phone: [phone] Fax: [phone]

## 2017-08-26 ENCOUNTER — Ambulatory Visit: Payer: PRIVATE HEALTH INSURANCE

## 2017-08-28 ENCOUNTER — Ambulatory Visit: Payer: PRIVATE HEALTH INSURANCE | Admitting: Physical Therapy

## 2017-08-29 ENCOUNTER — Ambulatory Visit: Payer: PRIVATE HEALTH INSURANCE | Admitting: Physical Therapy

## 2017-08-29 ENCOUNTER — Encounter: Payer: Self-pay | Admitting: Physical Therapy

## 2017-08-29 DIAGNOSIS — M6281 Muscle weakness (generalized): Secondary | ICD-10-CM

## 2017-08-29 DIAGNOSIS — R2689 Other abnormalities of gait and mobility: Secondary | ICD-10-CM

## 2017-08-29 DIAGNOSIS — G8222 Paraplegia, incomplete: Secondary | ICD-10-CM | POA: Diagnosis not present

## 2017-08-29 NOTE — Therapy (Signed)
[facility] [address] [place], [place], [address] Phone: [phone]   Fax:  [phone]  Physical Therapy Treatment  Patient Details  Name: [name] MRN: [id number] Date of Birth: 08-Mar-1971 Referring Provider: Terese Door, MD   Encounter Date: 08/29/2017  PT End of Session - 08/29/17 1922    Visit Number  8    Number of Visits  33    Date for PT Re-Evaluation  10/25/17    Authorization Type  Medcost    PT Start Time  1401    PT Stop Time  1446    PT Time Calculation (min)  45 min    Equipment Utilized During Treatment  Gait belt    Activity Tolerance  Patient tolerated treatment well    Behavior During Therapy  [facility] for tasks assessed/performed       Past Medical History:  Diagnosis Date  . Addison's disease ([place])   . Adrenal insufficiency ([place])   . Aortic stenosis   . Aortic stenosis   . Appendicitis 12/19/09  . Breast cancer ([place])    STATUS POST BILATERAL MASTECTOMY. STATUS POST RECONSTRUCTION. SHE HAD SILICONE BREAST IMPLANTS AND THE LEFT IMPLANT IS LEAKING SLIGHTLY  . Chest pain   . CHF with right heart failure ([place]) 04/17/2017  . GERD (gastroesophageal reflux disease)   . Hodgkin lymphoma ([facility])    STATUS POST MANTLE RADIATION  . Hypertension   . Osteoporosis   . Palpitations   . Raynaud phenomenon   . SVT (supraventricular tachycardia) ([place])   . Tachycardia   . Thyroid cancer ([place])    STATUS POST SURGICAL REMOVAL-CURRENT ON THYROID REPLACEMENT    Past Surgical History:  Procedure Laterality Date  . ABDOMINAL HYSTERECTOMY    . APPENDECTOMY    . CARDIAC CATHETERIZATION  05/18/09   NORMAL CATH  . PITUITARY SURGERY      There were no vitals filed for this visit.  Subjective Assessment - 08/29/17 1407    Subjective  Had an MRI last [facility] at [facility] due to Lt breast implant rupture. The pain has increased tremendously recently and cannot even tolerate putting her arm down to her side.  States she was able to perceive sensation on anterior lt thigh during recent massage and was so happy. Area is now numb again.     Pertinent History  Hodgkin's lymphoma 1987 (46yo) s/p mantle radiation & chemotherapy (currently in remission), Breast CA 2000 s/p bilateral masectomies with skin recurrence, recent cervical CA, malignant neoplasms of thyroid gland, anxiety disorder, ADHD, depression, pituitary adenoma s/p adenomectomy, dyspnea     Patient Stated Goals  Get use of legs back and out of w/c.     Currently in Pain?  Yes    Pain Score  7     Pain Location  Axilla    Pain Orientation  Left    Pain Descriptors / Indicators  Aching;Discomfort    Pain Type  Chronic pain    Pain Onset  More than a month ago    Pain Frequency  Constant                      [facility] Adult PT Treatment/Exercise - 08/29/17 1913      Transfers   Transfers  Sit to Stand;Stand to [employer];Squat Pivot Transfers    Sit to Stand  4: Min guard    Sit to Stand Details (indicate cue type and reason)  pt refused to attempt come  to stand in // bars (while pulling up on bars, not trying to use armrests to push off) unless PT was close in front of her and holding the gait belt    Stand to Sit  4: Min guard    Squat Pivot Transfers  6: Modified independent (Device/Increase time)    Transfer Cueing  need to lock both brakes on w/c prior to transfer; pt replied she can't transfer over the extended brake handle so she leaves it unlocked; 4 reps squat pivot with weight bearing in bil LEs noted (especially with transfer to surface ~5-6 inches higher than her w/c)      Ambulation/Gait   Ambulation/Gait  Yes    Ambulation/Gait Assistance  4: Min guard    Ambulation/Gait Assistance Details  pt wearing socks and was able to slide her feet along the floor while holding // bars with bil UEs; no facilitation required however pt did NOT want PT to let go of her gait belt    Ambulation Distance (Feet)  8 Feet x2, seated rest     Assistive device  Parallel bars    Gait Pattern  Decreased stride length;Decreased weight shift to right;Decreased weight shift to left;Decreased dorsiflexion - left;Decreased dorsiflexion - right;Decreased hip/knee flexion - right;Decreased hip/knee flexion - left;Shuffle;Poor foot clearance - left;Poor foot clearance - right;Step-through pattern    Ambulation Surface  Level;Indoor      Knee/Hip Exercises: Aerobic   Nustep  L1 bil UEs with feet strapped to footplates and palpable quad contractions; x 5 minutes; initially utilized Sci-fit stepper x 1.5 minutes L1 however pt decided she preferred the Nustep and requested to switch machines               PT Short Term Goals - 08/29/17 1859      PT SHORT TERM GOAL #1   Title  Patient demonstrates understanding of initial HEP. (All STGs target date: 08/30/2017)    Baseline  08/29/17 unable to assess due to time limitations    Time  4    Period  Weeks    Status  Unable to assess      PT SHORT TERM GOAL #2   Title  Sit to/from stand w/c to parallel bars with supervision.     Baseline  08/30/17 minguard (pt would not attempt without PT in close to her)    Time  4    Period  Weeks    Status  Partially Met      PT SHORT TERM GOAL #3   Title  Patient able to tolerate standing in parallel bars & perform head turns for 2 minutes with supervision.     Time  4    Period  Weeks    Status  Achieved      PT SHORT TERM GOAL #4   Title  Patient ambulates 5' with LRAD with modA.     Baseline  08/29/17  Walked in // bars 8 ft with minguard assist    Time  4    Period  Weeks    Status  Partially Met        PT Long Term Goals - 08/14/17 2257      PT LONG TERM GOAL #1   Title  Patient is independent with ongoing HEP / fitness plan.  (All LTGs Target Date: 10/25/2017)    Time  12    Period  Weeks    Status  On-going    Target Date  10/25/17  PT LONG TERM GOAL #2   Title  Patient performs sit to/from stand from chairs with  armrests to assistive device modified independent.     Baseline  Due to conversion disorder diagnosis, this LTG may be upgraded / changed depending on patient response to treatment.     Time  12    Period  Weeks    Status  On-going    Target Date  10/25/17      PT LONG TERM GOAL #3   Title  Patient performs standing ADLs with arm support reaching 10", pick up objects from floor, turning trunk & head to scan and managing clothes modified independent.     Baseline  Due to conversion disorder diagnosis, this LTG may be upgraded / changed depending on patient response to treatment.     Time  12    Period  Weeks    Status  On-going    Target Date  10/25/17      PT LONG TERM GOAL #4   Title  Patient ambulates 34' with LRAD with supervision.     Baseline  Due to conversion disorder diagnosis, this LTG may be upgraded / changed depending on patient response to treatment.     Time  12    Period  Weeks    Status  On-going    Target Date  10/25/17            Plan - 08/29/17 1924    Clinical Impression Statement  Session included assessment of progress towards STGs with pt meeting 1 of 4 goals, partially meeting 2 of 4 goals, and final goal could not be assessed due to time limitations. Patient requires frequent redirection to task. Patient initially stating she did not know how much PT she could do today due to her pain in lt axilla, however when allowed to direct which areas to address during session, she selected activities that required increased effort from bil UEs and did not report any pain throughout session. She is utilizing legs during transfers and walking more than she realizes. Anticipate excellent progress if patient remains motivated to improve.     Rehab Potential  Good    Clinical Impairments Affecting Rehab Potential  Hodgkin's lymphoma 1987 (46yo) s/p mantle radiation & chemotherapy (currently in remission), Breast CA 2000 s/p bilateral masectomies with skin recurrence,  recent cervical CA, malignant neoplasms of thyroid gland, anxiety disorder, ADHD, depression, pituitary adenoma s/p adenomectomy, dyspnea     PT Frequency  3x / week    PT Duration  12 weeks    PT Treatment/Interventions  ADLs/Self Care Home Management;DME Instruction;Gait training;Stair training;Functional mobility training;Therapeutic activities;Therapeutic exercise;Balance training;Neuromuscular re-education;Patient/family education;Wheelchair mobility training;Vestibular    PT Next Visit Plan  Check STG #1 if time. Try sit to stand to walker and ambulation with RW. Continue with standing frame activities to engage trunk and LE's. Pt prefers Nustep (not Sci-Fit)    Consulted and Agree with Plan of Care  Patient;Family member/caregiver    Family Member Consulted  husband       Patient will benefit from skilled therapeutic intervention in order to improve the following deficits and impairments:  Abnormal gait, Decreased activity tolerance, Decreased balance, Decreased endurance, Decreased knowledge of use of DME, Decreased mobility, Decreased strength, Pain  Visit Diagnosis: Other abnormalities of gait and mobility  Muscle weakness (generalized)     Problem List Patient Active Problem List   Diagnosis Date Noted  . Right heart failure ([place]) 04/17/2017  . Edema  11/16/2014  . Dizziness 11/16/2014  . Hyperlipidemia 03/09/2014  . Hodgkin lymphoma ([place])   . Breast cancer ([place])   . Thyroid cancer ([place])   . Adrenal insufficiency ([place])   . Tachycardia   . Heart palpitations   . Chest discomfort   . Hypertension   . Raynaud phenomenon   . Appendicitis   . GERD (gastroesophageal reflux disease)     Rexanne Mano, PT 08/29/2017, 7:33 PM  [facility] [address] [place], [place], [address] Phone: [phone]   Fax:  [phone]  Name: KAELANI KENDRICK MRN: [id number] Date of Birth: Feb 24, 1971

## 2017-08-30 ENCOUNTER — Ambulatory Visit: Payer: Self-pay

## 2017-09-04 ENCOUNTER — Ambulatory Visit: Payer: PRIVATE HEALTH INSURANCE | Admitting: Physical Therapy

## 2017-09-06 ENCOUNTER — Ambulatory Visit: Payer: PRIVATE HEALTH INSURANCE | Attending: Psychiatry | Admitting: Physical Therapy

## 2017-09-06 ENCOUNTER — Ambulatory Visit: Payer: PRIVATE HEALTH INSURANCE | Admitting: Psychology

## 2017-09-09 ENCOUNTER — Ambulatory Visit: Payer: Self-pay | Admitting: Physical Therapy

## 2017-09-11 ENCOUNTER — Ambulatory Visit: Payer: PRIVATE HEALTH INSURANCE | Admitting: Physical Therapy

## 2017-09-13 ENCOUNTER — Ambulatory Visit: Payer: PRIVATE HEALTH INSURANCE

## 2017-09-16 ENCOUNTER — Ambulatory Visit: Payer: PRIVATE HEALTH INSURANCE | Admitting: Physical Therapy

## 2017-09-18 ENCOUNTER — Ambulatory Visit: Payer: PRIVATE HEALTH INSURANCE

## 2017-09-19 ENCOUNTER — Encounter: Payer: Self-pay | Admitting: Physical Therapy

## 2017-09-20 ENCOUNTER — Ambulatory Visit: Payer: PRIVATE HEALTH INSURANCE | Admitting: Physical Therapy

## 2017-09-25 ENCOUNTER — Ambulatory Visit: Payer: PRIVATE HEALTH INSURANCE

## 2017-09-26 ENCOUNTER — Ambulatory Visit: Payer: PRIVATE HEALTH INSURANCE

## 2017-09-27 ENCOUNTER — Ambulatory Visit: Payer: Self-pay | Admitting: Physical Therapy

## 2017-09-30 ENCOUNTER — Ambulatory Visit: Payer: Self-pay | Admitting: Physical Therapy

## 2017-10-02 ENCOUNTER — Ambulatory Visit: Payer: PRIVATE HEALTH INSURANCE | Admitting: Physical Therapy

## 2017-10-03 ENCOUNTER — Ambulatory Visit: Payer: PRIVATE HEALTH INSURANCE | Admitting: Physical Therapy

## 2017-10-21 ENCOUNTER — Encounter: Payer: Self-pay | Admitting: Physical Therapy

## 2017-10-23 ENCOUNTER — Ambulatory Visit: Payer: Self-pay | Admitting: Physical Therapy

## 2017-10-29 ENCOUNTER — Ambulatory Visit: Payer: PRIVATE HEALTH INSURANCE

## 2017-10-29 ENCOUNTER — Ambulatory Visit: Payer: PRIVATE HEALTH INSURANCE | Attending: Psychiatry | Admitting: Physical Therapy

## 2017-10-29 ENCOUNTER — Encounter: Payer: Self-pay | Admitting: Physical Therapy

## 2017-10-29 DIAGNOSIS — G8222 Paraplegia, incomplete: Secondary | ICD-10-CM | POA: Diagnosis present

## 2017-10-29 DIAGNOSIS — R2681 Unsteadiness on feet: Secondary | ICD-10-CM | POA: Insufficient documentation

## 2017-10-29 DIAGNOSIS — M6281 Muscle weakness (generalized): Secondary | ICD-10-CM | POA: Insufficient documentation

## 2017-10-29 DIAGNOSIS — R2689 Other abnormalities of gait and mobility: Secondary | ICD-10-CM | POA: Diagnosis present

## 2017-10-30 NOTE — Therapy (Signed)
[facility] [address] [place], [place], [address] Phone: [phone]   Fax:  [phone]  Physical Therapy Treatment  Patient Details  Name: [name] MRN: [id number] Date of Birth: November 26, 1970 Referring Provider: Terese Door, MD (pt reports got notice 1/28 that Dr. Bella Kennedy has left practice)   Encounter Date: 10/29/2017  PT End of Session - 10/29/17 1226    Visit Number  9    Number of Visits  25    Date for PT Re-Evaluation  12/27/17    Authorization Type  Medcost    PT Start Time  1015    PT Stop Time  1055    PT Time Calculation (min)  40 min    Equipment Utilized During Treatment  Gait belt    Activity Tolerance  Patient tolerated treatment well    Behavior During Therapy  [facility] for tasks assessed/performed       Past Medical History:  Diagnosis Date  . Addison's disease ([place])   . Adrenal insufficiency ([place])   . Aortic stenosis   . Aortic stenosis   . Appendicitis 12/19/09  . Breast cancer ([place])    STATUS POST BILATERAL MASTECTOMY. STATUS POST RECONSTRUCTION. SHE HAD SILICONE BREAST IMPLANTS AND THE LEFT IMPLANT IS LEAKING SLIGHTLY  . Chest pain   . CHF with right heart failure ([place]) 04/17/2017  . GERD (gastroesophageal reflux disease)   . Hodgkin lymphoma (H[facility])    STATUS POST MANTLE RADIATION  . Hypertension   . Osteoporosis   . Palpitations   . Raynaud phenomenon   . SVT (supraventricular tachycardia) ([place])   . Tachycardia   . Thyroid cancer ([place])    STATUS POST SURGICAL REMOVAL-CURRENT ON THYROID REPLACEMENT    Past Surgical History:  Procedure Laterality Date  . ABDOMINAL HYSTERECTOMY    . APPENDECTOMY    . CARDIAC CATHETERIZATION  05/18/09   NORMAL CATH  . PITUITARY SURGERY      There were no vitals filed for this visit.  Subjective Assessment - 10/29/17 1021    Subjective  Last seen by [facility] PT on 08/29/2017. Pulmonologist nodules are stable, biopsy surgery 09/18/2017 which was  diagnosed with Subepithetial Fibroelastosis. She is using nebolyzers, 4 inhalers and oxygen 3l constant around 1 when inhalers wear off. She has increased reflux and having pH prope inserted tomorrow. She is having surgery nissen procedure (wrap top of stomach around espohgus). She has been seeing massage therapist and had sensation. She has been seeing massage therapist and chiropractor 3x/wk as her LE muscles have been returning since around Christmas. Her leg strength has been improving. Her breast implant is not leaking but has shifted so plan is to leave it alone for now.     Pertinent History  Hodgkin's lymphoma 1987 (47yo) s/p mantle radiation & chemotherapy (currently in remission), Breast CA 2000 s/p bilateral masectomies with skin recurrence, recent cervical CA, malignant neoplasms of thyroid gland, anxiety disorder, ADHD, depression, pituitary adenoma s/p adenomectomy, dyspnea     Patient Stated Goals  Get use of legs back and out of w/c. To start using RW for moblity for now. And build endurance [place].     Currently in Pain?  Yes    Pain Score  0-No pain first thing in am 3/10, stand/walk 6/10 worst 8/10    Pain Location  Back including hips    Pain Orientation  Mid;Lower    Pain Descriptors / Indicators  Tightness;Burning;Spasm    Pain Type  Chronic pain  Pain Onset  More than a month ago    Pain Frequency  Intermittent    Aggravating Factors   standing & walking, working while seated in w/c    Pain Relieving Factors  massage, heating pad/ice, icy hot, Theracane    Effect of Pain on Daily Activities  limits standing         [facility] PT Assessment - 10/29/17 1015      Assessment   Medical Diagnosis  incomplete paraplegia conversion disorder    Referring Provider  Terese Door, MD pt reports got notice 1/28 that Dr. Bella Kennedy has left practice    Onset Date/Surgical Date  06/26/17      Transfers   Transfers  Sit to Stand;Stand to Sit    Sit to Stand  6: Modified independent  (Device/Increase time);With upper extremity assist;From chair/3-in-1    Stand to Sit  6: Modified independent (Device/Increase time);With upper extremity assist;To chair/3-in-1      Ambulation/Gait   Ambulation/Gait  Yes    Ambulation/Gait Assistance  4: Min guard    Ambulation/Gait Assistance Details  cane for limited household & rollator for longer distance    Ambulation Distance (Feet)  50 Feet    Assistive device  None;Straight cane;Rollator    Gait Pattern  Step-through pattern;Decreased stride length;Decreased trunk rotation;Trunk flexed;Wide base of support    Ambulation Surface  Indoor;Level    Gait velocity  1.57 ft/sec cane & min guard      Standardized Balance Assessment   Standardized Balance Assessment  Berg Balance Test;Timed Up and Go Test      Berg Balance Test   Sit to Stand  Able to stand without using hands and stabilize independently    Standing Unsupported  Able to stand safely 2 minutes    Sitting with Back Unsupported but Feet Supported on Floor or Stool  Able to sit safely and securely 2 minutes    Stand to Sit  Sits safely with minimal use of hands    Transfers  Able to transfer safely, minor use of hands    Standing Unsupported with Eyes Closed  Able to stand 10 seconds safely    Standing Ubsupported with Feet Together  Able to place feet together independently and stand 1 minute safely    From Standing, Reach Forward with Outstretched Arm  Can reach confidently >25 cm (10")    From Standing Position, Pick up Object from Floor  Able to pick up shoe safely and easily    From Standing Position, Turn to Look Behind Over each Shoulder  Looks behind from both sides and weight shifts well    Turn 360 Degrees  Able to turn 360 degrees safely but slowly    Standing Unsupported, Alternately Place Feet on Step/Stool  Able to stand independently and complete 8 steps >20 seconds    Standing Unsupported, One Foot in Front  Able to plae foot ahead of the other independently  and hold 30 seconds    Standing on One Leg  Able to lift leg independently and hold > 10 seconds    Total Score  52      Timed Up and Go Test   Normal TUG (seconds)  24.88 no device with min guard                            PT Short Term Goals - 10/29/17 1415      PT SHORT TERM GOAL #1  Title  Patient demonstrates understanding of updated HEP. (All STGs target date: 11/29/2017)    Time  4    Period  Weeks    Status  Revised    Target Date  11/29/17      PT SHORT TERM GOAL #2   Title  Patient ambulates 300' with rollator walker modified independent.     Time  4    Period  Weeks    Status  New    Target Date  11/29/17      PT SHORT TERM GOAL #3   Title  Patient negotiates ramps & curbs with rollator walker modified independent,     Time  4    Period  Weeks    Status  New    Target Date  11/29/17        PT Long Term Goals - 10/29/17 1411      PT LONG TERM GOAL #1   Title  Patient is independent with ongoing HEP / fitness plan.  (All LTGs Target Date: 12/27/2017)    Time  8    Period  Weeks    Status  On-going    Target Date  12/27/17      PT LONG TERM GOAL #2   Title  Timed Up-Go no assistive device <13.5sec independent.     Time  8    Period  Weeks    Status  New    Target Date  12/27/17      PT LONG TERM GOAL #3   Title  Patient ambulates 500' with LRAD modified independent for community access.     Time  8    Period  Weeks    Status  New    Target Date  12/27/17      PT LONG TERM GOAL #4   Title  Patient negotiates ramps, curbs & stairs with LRAD modified independent for community access.     Time  8    Period  Weeks    Status  New    Target Date  12/27/17      PT LONG TERM GOAL #5   Title  Gait Velocity with LRAD >2.2 ft/sec to indicate functional level.     Time  8    Period  Weeks    Status  New    Target Date  12/27/17            Plan - 10/29/17 1829    Clinical Impression Statement  Patient has not been present  for 2 months with PT. She was placed on hold until she could attend consistently with issues from medical & family situations. She had incomplete paraplegia on 11/29 with no active LE movements. She returns with active LE movement. Berg Balance Test 52/56 and TImed Up-Go without device with min guard 24.88sec. She ambulated 61' with cane & minA at 1.73f/sec. She has back & hip pain with gait so PT assessed with a rollator walker. Patient improved posture, fluency of gait & was able to control rollator with supervision. Patient appears would benefit from a rollator walker for gait as her strength, endurance & balance return.     Rehab Potential  Good    Clinical Impairments Affecting Rehab Potential  Hodgkin's lymphoma 1987 (47yo) s/p mantle radiation & chemotherapy (currently in remission), Breast CA 2000 s/p bilateral masectomies with skin recurrence, recent cervical CA, malignant neoplasms of thyroid gland, anxiety disorder, ADHD, depression, pituitary adenoma s/p adenomectomy, dyspnea     PT Frequency  2x / week    PT Duration  8 weeks    PT Treatment/Interventions  ADLs/Self Care Home Management;DME Instruction;Gait training;Stair training;Functional mobility training;Therapeutic activities;Therapeutic exercise;Balance training;Neuromuscular re-education;Patient/family education;Wheelchair mobility training;Vestibular    PT Next Visit Plan  gait training with rollator walker including instruction in sit to/from stand rollator seat, ramps & curbs. MMT LEs.     Consulted and Agree with Plan of Care  Patient    Family Member Consulted  --       Patient will benefit from skilled therapeutic intervention in order to improve the following deficits and impairments:  Abnormal gait, Decreased activity tolerance, Decreased balance, Decreased endurance, Decreased knowledge of use of DME, Decreased mobility, Decreased strength, Pain  Visit Diagnosis: Other abnormalities of gait and mobility  Muscle  weakness (generalized)  Paraplegia, incomplete (HCC)  Unsteadiness on feet     Problem List Patient Active Problem List   Diagnosis Date Noted  . Right heart failure ([place]) 04/17/2017  . Edema 11/16/2014  . Dizziness 11/16/2014  . Hyperlipidemia 03/09/2014  . Hodgkin lymphoma ([place])   . Breast cancer ([place])   . Thyroid cancer ([place])   . Adrenal insufficiency ([place])   . Tachycardia   . Heart palpitations   . Chest discomfort   . Hypertension   . Raynaud phenomenon   . Appendicitis   . GERD (gastroesophageal reflux disease)     Jamae Tison  PT, DPT 10/30/2017, 2:22 PM  [facility] [address] [place], [place], [address] Phone: [phone]   Fax:  [phone]  Name: [name] MRN: [id number] Date of Birth: Feb 14, 1971

## 2017-11-06 ENCOUNTER — Encounter: Payer: Self-pay | Admitting: Cardiovascular Disease

## 2017-11-06 NOTE — Telephone Encounter (Signed)
Spoke with patient who called to report recent test results from [facility]. 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 She states her lungs are attacking themselves and reducing elasticity in the lungs. She states her case was presented to other providers and she goes back to pulmonologist next week to determine treatment plan. She reports she also recently had genetic testing which revealed biological mother died in 47's from breast cancer and biological father died at age of 51 one of 17 siblings, 25 of which had MIs in their 13's and severe heart disease. States the other sibling has had 2 valve replacements, another leaky valve and is on heart transplant list. She states recent CT also revealed pelvic vascular calcifications increasing with each scan, worsening coronary calcifications since Feb of last year and possibly worsening carotid bruit. She states the pulmonologist advised her to call her cardiologist. She has an appointment with Dr. Acie Fredrickson on 2/26. I advised I will forward information to Dr. Acie Fredrickson and will call her back with any additional advice or recommendations. She verbalized understanding and agreement and thanked me for the call.

## 2017-11-06 NOTE — Telephone Encounter (Signed)
I will call her soon and discuss.  It sounds like her acute issues are related to pulmonary fibrosis and fibroelastosis. I'll try to call her tomorrow and discuss further with her

## 2017-11-12 ENCOUNTER — Ambulatory Visit: Payer: PRIVATE HEALTH INSURANCE | Attending: Psychiatry | Admitting: Physical Therapy

## 2017-11-12 ENCOUNTER — Encounter: Payer: Self-pay | Admitting: Physical Therapy

## 2017-11-12 DIAGNOSIS — R2681 Unsteadiness on feet: Secondary | ICD-10-CM | POA: Diagnosis present

## 2017-11-12 DIAGNOSIS — M6281 Muscle weakness (generalized): Secondary | ICD-10-CM | POA: Insufficient documentation

## 2017-11-12 NOTE — Therapy (Signed)
[facility] [address] [place], [place], [address] Phone: [phone]   Fax:  [phone]  Physical Therapy Treatment  Patient Details  Name: [name] MRN: [id number] Date of Birth: [birth year] Referring Provider: Terese Door, MD (pt reports got notice 1/28 that Dr. Bella Kennedy has left practice)   Encounter Date: 11/12/2017  PT End of Session - 11/12/17 1936    Visit Number  10    Number of Visits  25    Date for PT Re-Evaluation  12/27/17    Authorization Type  Medcost    PT Start Time  1315    PT Stop Time  1357    PT Time Calculation (min)  42 min    Activity Tolerance  Patient tolerated treatment well    Behavior During Therapy  [facility] for tasks assessed/performed       Past Medical History:  Diagnosis Date  . Addison's disease ([place])   . Adrenal insufficiency ([place])   . Aortic stenosis   . Aortic stenosis   . Appendicitis 12/19/09  . Breast cancer ([place])    STATUS POST BILATERAL MASTECTOMY. STATUS POST RECONSTRUCTION. SHE HAD SILICONE BREAST IMPLANTS AND THE LEFT IMPLANT IS LEAKING SLIGHTLY  . Chest pain   . CHF with right heart failure ([place]) 04/17/2017  . GERD (gastroesophageal reflux disease)   . Hodgkin lymphoma (H[facility])    STATUS POST MANTLE RADIATION  . Hypertension   . Osteoporosis   . Palpitations   . Raynaud phenomenon   . SVT (supraventricular tachycardia) ([place])   . Tachycardia   . Thyroid cancer ([place])    STATUS POST SURGICAL REMOVAL-CURRENT ON THYROID REPLACEMENT    Past Surgical History:  Procedure Laterality Date  . ABDOMINAL HYSTERECTOMY    . APPENDECTOMY    . CARDIAC CATHETERIZATION  05/18/09   NORMAL CATH  . PITUITARY SURGERY      There were no vitals filed for this visit.  Subjective Assessment - 11/12/17 1321    Subjective  Developed rt eye sty and didn't sleep well last couple of nights. Got her rollator last week and has already learned how to use, turn to sit and stand, and  lift/roll up curb. Doing her HEP every day. Using O2 more.     Pertinent History  Hodgkin's lymphoma 1987 (47yo) s/p mantle radiation & chemotherapy (currently in remission), Breast CA 2000 s/p bilateral masectomies with skin recurrence, recent cervical CA, malignant neoplasms of thyroid gland, anxiety disorder, ADHD, depression, pituitary adenoma s/p adenomectomy, dyspnea     Patient Stated Goals  Get use of legs back and out of w/c. To start using RW for moblity for now. And build endurance [place].     Currently in Pain?  No/denies just tired    Pain Onset  More than a month ago                      [facility] Adult PT Treatment/Exercise - 11/12/17 1929      Transfers   Transfers  Sit to Stand;Stand to Sit    Sit to Stand  5: Supervision    Sit to Stand Details (indicate cue type and reason)  vc for safest sequencing with rollator    Stand to Sit  6: Modified independent (Device/Increase time)      Ambulation/Gait   Ambulation/Gait  Yes    Ambulation/Gait Assistance  4: Min guard    Ambulation/Gait Assistance Details  assessed height of rollator and handles ~  1.5 inches higher than ulnar styloid process; pt refused shortening as she sttes she tried it that way and felt like she had to bend over to use handles, which hurt her back. Maintains upright posture and proximity to RW.; she deferred demonstrating turning to sit on rollator or up/down curb as she states she knows how already    Ambulation Distance (Feet)  100 Feet 120, 40, 60    Ambulation Surface  Indoor      Knee/Hip Exercises: Aerobic   Nustep  L3 x 7 minutes all 4 exrtremities          Balance Exercises - 11/12/17 1355      Balance Exercises: Standing   Standing Eyes Opened  Narrow base of support (BOS);Wide (BOA);Head turns;Foam/compliant surface    Standing Eyes Closed  Narrow base of support (BOS);Wide (BOA);Solid surface    Tandem Stance  Eyes open;Eyes closed;30 secs    Balance Beam  blue beam very  challenging and could not do with no UE support    Tandem Gait  Forward;Upper extremity support;4 reps    Partial Tandem Stance  Eyes closed;Intermittent upper extremity support on blue airex    Retro Gait  Upper extremity support;4 reps    Sidestepping  Upper extremity support 6 reps    Marching Limitations  weak hip flexors with "low march"    Heel Raises Limitations  unable due to weakness    Toe Raise Limitations  able to lift bil ~.5 inch          PT Short Term Goals - 10/29/17 1415      PT SHORT TERM GOAL #1   Title  Patient demonstrates understanding of updated HEP. (All STGs target date: 11/29/2017)    Time  4    Period  Weeks    Status  Revised    Target Date  11/29/17      PT SHORT TERM GOAL #2   Title  Patient ambulates 300' with rollator walker modified independent.     Time  4    Period  Weeks    Status  New    Target Date  11/29/17      PT SHORT TERM GOAL #3   Title  Patient negotiates ramps & curbs with rollator walker modified independent,     Time  4    Period  Weeks    Status  New    Target Date  11/29/17        PT Long Term Goals - 10/29/17 1411      PT LONG TERM GOAL #1   Title  Patient is independent with ongoing HEP / fitness plan.  (All LTGs Target Date: 12/27/2017)    Time  8    Period  Weeks    Status  On-going    Target Date  12/27/17      PT LONG TERM GOAL #2   Title  Timed Up-Go no assistive device <13.5sec independent.     Time  8    Period  Weeks    Status  New    Target Date  12/27/17      PT LONG TERM GOAL #3   Title  Patient ambulates 500' with LRAD modified independent for community access.     Time  8    Period  Weeks    Status  New    Target Date  12/27/17      PT LONG TERM GOAL #4   Title  Patient  negotiates ramps, curbs & stairs with LRAD modified independent for community access.     Time  8    Period  Weeks    Status  New    Target Date  12/27/17      PT LONG TERM GOAL #5   Title  Gait Velocity with LRAD >2.2  ft/sec to indicate functional level.     Time  8    Period  Weeks    Status  New    Target Date  12/27/17            Plan - 11/12/17 1937    Clinical Impression Statement  Patient walking into clinic with rollator and nasal cannula for O2. She removes O2 prior to starting PT session and states she will know by her SOB if she needs to resume using during session. Original plan to address education related to use of rollator, howeer pt declined stating she has had the rollator for one week and has "figured it out. " Able to describe correct technique for up;down a curb. Focused on balance triaining and building her stamina (only one seted rest stop throughout session). Patient slowly proressing.     Rehab Potential  Good    Clinical Impairments Affecting Rehab Potential  Hodgkin's lymphoma 1987 (47yo) s/p mantle radiation & chemotherapy (currently in remission), Breast CA 2000 s/p bilateral masectomies with skin recurrence, recent cervical CA, malignant neoplasms of thyroid gland, anxiety disorder, ADHD, depression, pituitary adenoma s/p adenomectomy, dyspnea     PT Frequency  2x / week    PT Duration  8 weeks    PT Treatment/Interventions  ADLs/Self Care Home Management;DME Instruction;Gait training;Stair training;Functional mobility training;Therapeutic activities;Therapeutic exercise;Balance training;Neuromuscular re-education;Patient/family education;Wheelchair mobility training;Vestibular    PT Next Visit Plan  balance activities on compliant surfaces, LE strengthening    Consulted and Agree with Plan of Care  Patient       Patient will benefit from skilled therapeutic intervention in order to improve the following deficits and impairments:  Abnormal gait, Decreased activity tolerance, Decreased balance, Decreased endurance, Decreased knowledge of use of DME, Decreased mobility, Decreased strength, Pain  Visit Diagnosis: Muscle weakness (generalized)  Unsteadiness on  feet     Problem List Patient Active Problem List   Diagnosis Date Noted  . Right heart failure ([place]) 04/17/2017  . Edema 11/16/2014  . Dizziness 11/16/2014  . Hyperlipidemia 03/09/2014  . Hodgkin lymphoma ([place])   . Breast cancer ([place])   . Thyroid cancer ([place])   . Adrenal insufficiency ([place])   . Tachycardia   . Heart palpitations   . Chest discomfort   . Hypertension   . Raynaud phenomenon   . Appendicitis   . GERD (gastroesophageal reflux disease)     Rexanne Mano, PT 11/12/2017, 7:44 PM  [facility] [address] [place], [place], [address] Phone: [phone]   Fax:  [phone]  Name: [name] MRN: [id number] Date of Birth: 23-Jan-1971

## 2017-11-14 ENCOUNTER — Ambulatory Visit: Payer: PRIVATE HEALTH INSURANCE

## 2017-11-19 ENCOUNTER — Ambulatory Visit: Payer: PRIVATE HEALTH INSURANCE | Admitting: Physical Therapy

## 2017-11-21 ENCOUNTER — Ambulatory Visit: Payer: PRIVATE HEALTH INSURANCE | Admitting: Physical Therapy

## 2017-11-26 ENCOUNTER — Ambulatory Visit: Payer: Self-pay | Admitting: Cardiovascular Disease

## 2017-11-26 ENCOUNTER — Ambulatory Visit: Payer: PRIVATE HEALTH INSURANCE | Admitting: Physical Therapy

## 2017-11-27 ENCOUNTER — Encounter: Payer: Self-pay | Admitting: Cardiovascular Disease

## 2017-11-28 ENCOUNTER — Ambulatory Visit: Payer: PRIVATE HEALTH INSURANCE | Admitting: Physical Therapy

## 2017-11-28 ENCOUNTER — Encounter: Payer: Self-pay | Admitting: Cardiovascular Disease

## 2017-11-29 ENCOUNTER — Ambulatory Visit: Payer: Self-pay | Admitting: Cardiovascular Disease

## 2017-12-01 ENCOUNTER — Encounter: Payer: Self-pay | Admitting: Physical Therapy

## 2017-12-03 ENCOUNTER — Ambulatory Visit: Payer: PRIVATE HEALTH INSURANCE | Admitting: Physical Therapy

## 2017-12-05 ENCOUNTER — Ambulatory Visit: Payer: Self-pay | Admitting: Physical Therapy

## 2017-12-06 ENCOUNTER — Telehealth: Payer: Self-pay | Admitting: Cardiovascular Disease

## 2017-12-06 NOTE — Telephone Encounter (Signed)
New Message    Patient wants you to find her an earlier appt , she does not want APP or to wait until May. Please call

## 2017-12-06 NOTE — Telephone Encounter (Signed)
Left message for patient that Dr. Elmarie Shiley nurse will call next week to arrange appointment.

## 2017-12-09 NOTE — Telephone Encounter (Signed)
Left detailed message for patient that I am working to find her an appointment with Dr. Acie Fredrickson this week or next. I advised that I am also sending the note to our scheduler. I advised her to call back with questions or concerns.

## 2017-12-10 ENCOUNTER — Ambulatory Visit: Payer: Self-pay | Admitting: Physical Therapy

## 2017-12-11 ENCOUNTER — Ambulatory Visit: Payer: Self-pay | Admitting: Cardiovascular Disease

## 2017-12-11 NOTE — Telephone Encounter (Signed)
Left message for patient to call back to discuss coming into see Dr. Acie Fredrickson tomorrow afternoon; I advised that this appointment would not be posted on his schedule but he is willing to see her in the middle of his [facility] schedule.

## 2017-12-12 ENCOUNTER — Ambulatory Visit: Payer: Self-pay | Admitting: Physical Therapy

## 2017-12-17 ENCOUNTER — Ambulatory Visit: Payer: Self-pay | Admitting: Physical Therapy

## 2017-12-19 ENCOUNTER — Ambulatory Visit: Payer: Self-pay | Admitting: Physical Therapy

## 2017-12-27 ENCOUNTER — Other Ambulatory Visit: Payer: Self-pay | Admitting: Cardiovascular Disease

## 2018-01-13 ENCOUNTER — Encounter: Payer: Self-pay | Admitting: Cardiovascular Disease

## 2018-01-14 ENCOUNTER — Ambulatory Visit: Payer: Self-pay | Admitting: Cardiovascular Disease

## 2018-01-23 ENCOUNTER — Encounter: Payer: Self-pay | Admitting: Cardiovascular Disease

## 2018-01-23 ENCOUNTER — Ambulatory Visit (INDEPENDENT_AMBULATORY_CARE_PROVIDER_SITE_OTHER): Payer: Medicare Other | Admitting: Cardiovascular Disease

## 2018-01-23 VITALS — BP 108/70 | HR 80 | Ht 65.0 in | Wt 178.0 lb

## 2018-01-23 DIAGNOSIS — E039 Hypothyroidism, unspecified: Secondary | ICD-10-CM

## 2018-01-23 DIAGNOSIS — I10 Essential (primary) hypertension: Secondary | ICD-10-CM

## 2018-01-23 DIAGNOSIS — I50810 Right heart failure, unspecified: Secondary | ICD-10-CM | POA: Diagnosis not present

## 2018-01-23 MED ORDER — FUROSEMIDE 40 MG PO TABS: 40.0000 mg | ORAL_TABLET | Freq: Every day | ORAL | 3 refills | Status: DC

## 2018-01-23 MED ORDER — POTASSIUM CHLORIDE ER 10 MEQ PO TBCR: 10.0000 meq | EXTENDED_RELEASE_TABLET | Freq: Every day | ORAL | 3 refills | Status: DC

## 2018-01-23 NOTE — Progress Notes (Signed)
Cardiology Office Note   Date:  01/23/2018   ID:  [name], DOB Apr 27, 1971, MRN [id number]  PCP:  Ardith Dark, PA-C  Cardiologist:   Mertie Moores, MD   Chief Complaint  Patient presents with  . Palpitations   Problem List:  1. Palpitations/premature ventricular contractions 2. history of Hodgkin's lymphoma-status post mantle radiation 3. History of breast cancer-status post Reconstruction 4. History of cervical cancer 5. 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 2015 for regrowth of tumor.  6. History of thyroidectomy - hx of thyroid cancer  7. Raynaud's phenomenon 8. Appendectomy 9. History chest pains-normal heart catheterization, normal TEE 10. history of adrenal insufficiency  11. Hyperlipidemia   History of Present Illness:  [name] is done very well since I last saw her about a year ago. She has had some occasional palpitations that are typically controlled with metoprolol. She's not had any episodes of chest pain. She's been able to stop all of her steroids. Her adrenal glands have gradually improved and her now producing cortisol.  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 She is still working at [employer].   Feb 24, 2013:  She has done well since I last saw her . She has had her breast implants replaced ( old ones were leaking). Her costochondritis has resolved. She has not been lifting.   She has run in 6 5K races this year. She is playing tennis. She is still in [employer] school. She is doing her orthopedic rotation.   Nov. 11, 2014:  Her HR has been high. She has a head ache frequently. She started Adderall recently.  The adderall has help with her focus but she feels much worse in it. She has been on the Adderall for 2 months and the tachycardia started about 1 week ago.  She was doing cycle classes 3 times a  week. She is exercising regularly and for the past week, her HR is extremely fast. She has gone into menapause and has lots of hot flashes.   Feb. 23, 2015:  She has been having some recent episodes of tachycardia. We increased her metoprolol at the last visit. We performed an echocardiogram which revealed  Left ventricle: The cavity size was normal. Systolic function was normal. The estimated ejection fraction was in the range of 55% to 60%. Wall motion was normal; there were no regional wall motion abnormalities. Left ventricular diastolic function parameters were normal. - Aortic valve: Trivial regurgitation. - Mitral valve: Trivial regurgitation  She tried to stop her Adderall ( did not do well with that). She decreased her dose slightly and she is not having much tachycardia. She has been   She has been found to have some osteoperosis and osteopenia.   March 09, 2014:  [name] is doing ok.. No recent cardiac problems. She has been very active and is feeling great. Playing tennis on a regular basis.     Feb. 17, 2016:   [name] is a 47 y.o. female who presents for for evaluation of palpitations , hypotension. Echo yesterday  Left ventricle: The cavity size was normal. Systolic function was normal. The estimated ejection fraction was in the range of 55% to 60%. Wall motion was normal; there were no regional wall motion abnormalities. - Aortic valve: There was trivial regurgitation. - Mitral valve: Calcified annulus.  Labs were ordered yesterday but she was  not able to get them. Continues to have palpitations today She is off her Estrogen at this point.  She is back on the Toprol - skipped 2 days.  Has been taking the PRN propranolol  Not feeling well and has not been eating or drinking well. We given 1 dose of lasix   December 15, 2014:  She has continued to have palpitations  - last 20-30 seconds .  HR has remained fairly high. Has been working out  regularly .  These are going ok.    Feb 03, 2015: [name] continues to have palpitations . She wore a monitor but actually only wore is for about 1/2 of the time ( she was at [employer]) We have discussed implantable loop.  I have some concerns about doing any procedures on her .  She has only been taking the toprol 50 mg a day instead of 75.   May 17, 2015:  [name] is seen today for follow up. She has a history of hyperlipidemia and we sent her to lipid clinic. She is tolerating the Crestor very well.   Still having palpitations but learning how to manage .  Oct. 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 Has been seen at [facility] and is now at [facility]  She has sent scans to [facility]. Is on steroids to shrink tumor, has gained lots of weight.  Is hoping to be able to get a bx so that she have this tumor treated.   She is now on home O2.  O2 sats dropped to 78 while sleeping on room air.    O2 sats on 2.5 liters are good. She uses 4 lpm at night .   Is having more palpitations. Would like a refill for propranolol'   April 18, 2017:  [name] is seen today  Is still on home O2.,  Has chronic respiratory failure ( unclear etiology at this point )   Has RV failure.   Had a right heart cath.  PA pressures were only minimally elevated.  Had significant leg swelling   Has been on steroids.  Has developed pre-diabetes  Nov. 15,, 2018  In Sept 25,she woke up from a nap Ad tingling and numbness in her legs , had no strength in her legs . 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   2nd MRI of back showed a lesion between T10 -T 11.   Nothing that truly showed compression.  Was transferred to rehab unit in [place].    Has regained some motor ability I her lower abd.   Ruptured  her left breast implant using her arms in rehab  Scheduled to have her implants removed later this month .   Still on home O2,  Palpitations have been well controlled.   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 ( Bronch in Dec. 2018)    Has pulmonary HTN.  Lasix is no working for her at this point  - takes Lasix 20 mg a day   Is using a walker now - was in a wheelchair last time  Had temporary paralysis of her legs.  Improving .  Regaining her strength.      Past Medical  History:  Diagnosis Date  . Addison's disease ([place])   . Adrenal insufficiency ([place])   . Aortic stenosis   . Aortic stenosis   . Appendicitis 12/19/09  . Breast cancer ([place])    STATUS POST BILATERAL MASTECTOMY. STATUS POST RECONSTRUCTION. SHE HAD SILICONE BREAST IMPLANTS AND THE LEFT IMPLANT IS LEAKING SLIGHTLY  . Chest pain   . CHF with right heart failure ([place]) 04/17/2017  . GERD (gastroesophageal reflux disease)   . Hodgkin lymphoma ([facility])    STATUS POST MANTLE RADIATION  . Hypertension   . Osteoporosis   . Palpitations   . Raynaud phenomenon   . SVT (supraventricular tachycardia) ([place])   . Tachycardia   . Thyroid cancer ([place])    STATUS POST SURGICAL REMOVAL-CURRENT ON THYROID REPLACEMENT    Past Surgical History:  Procedure Laterality Date  . ABDOMINAL HYSTERECTOMY    . APPENDECTOMY    . CARDIAC CATHETERIZATION  05/18/09   NORMAL CATH  . PITUITARY SURGERY       Current Outpatient Medications  Medication Sig Dispense Refill  . amphetamine-dextroamphetamine (ADDERALL) 20 MG tablet Take 20 mg by mouth 2 (two) times daily.    . metoprolol succinate (TOPROL-XL) 25 MG 24 hr tablet Take 37.5 mg by mouth daily.    . rosuvastatin (CRESTOR) 5 MG tablet Take 5 mg by mouth daily.    Marland Kitchen albuterol (VENTOLIN HFA) 108 (90 Base) MCG/ACT inhaler Inhale 2 puffs into the lungs every 6 (six) hours as needed for wheezing or shortness of breath.    . Alum & Mag  Hydroxide-Simeth (GI COCKTAIL) SUSP suspension Take 30 mLs daily as needed by mouth for indigestion (BLEEDING ULCERS). Shake well.    . beclomethasone (QVAR) 80 MCG/ACT inhaler Inhale 2 puffs into the lungs 2 (two) times daily.    Marland Kitchen BREO ELLIPTA 200-25 MCG/INH AEPB INHALE 1 INHALATION INTO THE LUNGS ONCE DAILY.  11  . clonazePAM (KLONOPIN) 1 MG tablet Take 1 mg by mouth 3 (three) times daily.     . cyclophosphamide (CYTOXAN) 50 MG tablet Take 50 mg by mouth daily. Give on an empty stomach 1 hour before or 2 hours after meals.    Marland Kitchen dexlansoprazole (DEXILANT) 60 MG capsule Take 60 mg by mouth daily.      . Diclofenac Potassium (CAMBIA) 50 MG PACK Take 1 packet by mouth 3 (three) times daily as needed. AS NEEDED FOR HEADACHES    . escitalopram (LEXAPRO) 20 MG tablet Take 30 mg by mouth daily.   1  . estradiol (ESTRACE) 2 MG tablet Take 2 mg by mouth daily.  11  . ferrous sulfate 325 (65 FE) MG EC tablet Take by mouth.    . Ferrous Sulfate Dried (SLOW RELEASE IRON) 45 MG TBCR Take 1 tablet at bedtime by mouth.    . fluticasone furoate-vilanterol (BREO ELLIPTA) 200-25 MCG/INH AEPB Inhale 1 puff into the lungs daily.    . furosemide (LASIX) 40 MG tablet Take 1 tablet (40 mg total) by mouth daily. 90 tablet 3  . hydrocortisone (CORTEF) 10 MG tablet Take 5 mg by mouth 2 (two) times daily. Take 10 mg in the am and 5 mg in the pm    . ibuprofen (ADVIL,MOTRIN) 200 MG tablet Take 600 mg by mouth every 6 (six) hours as needed for moderate pain.    Marland Kitchen levothyroxine (SYNTHROID, LEVOTHROID) 100 MCG tablet Take 100 mcg by mouth daily.      . potassium chloride (K-DUR) 10 MEQ tablet Take 1 tablet (  10 mEq total) by mouth daily. 90 tablet 3  . sucralfate (CARAFATE) 1 g tablet Take 1 g as needed by mouth (FOR ULCERS).    . SUMAtriptan (IMITREX) 100 MG tablet Take 100 mg by mouth.    . topiramate (TOPAMAX) 100 MG tablet Take 100 mg by mouth.    . traMADol (ULTRAM) 50 MG tablet Take 50 mg by mouth daily as needed. AS  NEEDED FOR PAIN    . zolpidem (AMBIEN CR) 12.5 MG CR tablet Take 12.5 mg by mouth at bedtime.       No current facility-administered medications for this visit.     Allergies:   Cymbalta [duloxetine hcl]; Buprenorphine hcl; Compazine; Dilaudid [hydromorphone hcl]; Hydromorphone; Metoclopramide; Morphine and related; Na ferric gluc cplx in sucrose; Promethazine hcl; Metoclopramide hcl; Ondansetron; and Tegaderm ag mesh [silver]    Social History:  The patient  reports that she has never smoked. She has never used smokeless tobacco. She reports that she drinks alcohol. She reports that she does not use drugs.   Family History:  The patient's Family history is unknown by patient.    ROS:  Please see the history of present illness.    Physical Exam: Blood pressure 108/70, pulse 80, height _0  (1.651 m), weight 178 lb (80.7 kg), SpO2 90 %.  GEN:  Well nourished, well developed in no acute distress HEENT: Normal NECK: No JVD; No carotid bruits LYMPHATICS: No lymphadenopathy CARDIAC: RRR   RESPIRATORY:  Clear to auscultation without rales, wheezing or rhonchi  ABDOMEN: Soft, non-tender, non-distended MUSCULOSKELETAL:  No edema; No deformity  SKIN: Warm and dry NEUROLOGIC:  Alert and oriented x 3   EKG:      Recent Labs: 08/15/2017: BUN 15; Creatinine, Ser 0.79; Potassium 4.3; Sodium 141    Lipid Panel    Component Value Date/Time   CHOL 184 05/16/2015 0933   CHOL 207 (H) 12/08/2013 0823   TRIG 107.0 05/16/2015 0933   TRIG 96 12/08/2013 0823   HDL 63.00 05/16/2015 0933   HDL 57 12/08/2013 0823   CHOLHDL 3 05/16/2015 0933   VLDL 21.4 05/16/2015 0933   LDLCALC 100 (H) 05/16/2015 0933   LDLCALC 131 (H) 12/08/2013 0823      Wt Readings from Last 3 Encounters:  01/23/18 178 lb (80.7 kg)  08/15/17 176 lb (79.8 kg)  04/17/17 173 lb (78.5 kg)      Other studies Reviewed: Additional studies/ records that were reviewed today include: . Review of the above records  demonstrates:    ASSESSMENT AND PLAN:  Leg paralysis: Slowly impronving .   -  Right heart failure:   Has lung fibroelastosis . R heart cath in 2018 shows PA pressure of 30  She is not diuresing well on the current dose of lasix 20 mg a day  Will check labs today  Repeat BMP in 3 months   - Palpitations/premature ventricular contractions -   Seem to be stable     - history of Hodgkin's lymphoma-status post mantle radiation   - History of breast cancer-status post Reconstruction Has recurrent breast cancer.   With mets to lungs, liver and  brain .      4. History of cervical cancer 5. 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 2015 for regrowth of tumor.  6. History of thyroidectomy - hx of thyroid cancer ,    Check TSH, free T4, free  T3  7. Raynaud's phenomenon 8. Appendectomy 9.  History chest pains-normal heart catheterization, normal TEE,,  She's having intermittent episodes of chest palpitations and chest pains. These chest pains are noncardiac. 10. history of adrenal insufficiency  11.  Palpitations:     Current medicines are reviewed at length with the patient today.  The patient does not have concerns regarding medicines.  The following changes have been made:  no change  Disposition:   FU with me in me in 3  months.    Signed, Mertie Moores, MD  01/23/2018 12:14 PM    [facility] [place], [place], [place]  [address] Phone: [phone]; Fax: [phone]

## 2018-01-23 NOTE — Patient Instructions (Signed)
Medication Instructions:  Your physician has recommended you make the following change in your medication:   INCREASE Lasix to 40 mg daily INCREASE Kdur to 10 mEq daily   Labwork: TODAY - BMET, liver panel, cholesterol, thyroid panel  Your physician recommends that you return for lab work in: 3 weeks for BMET   Testing/Procedures: None Ordered   Follow-Up: Your physician recommends that you schedule a follow-up appointment in: 3 months with Dr. Acie Fredrickson   If you need a refill on your cardiac medications before your next appointment, please call your pharmacy.   Thank you for choosing [facility]! Christen Bame, RN [phone]

## 2018-01-24 LAB — HEPATIC FUNCTION PANEL
ALK PHOS: 47 IU/L (ref 39–117)
ALT: 41 IU/L — AB (ref 0–32)
AST: 41 IU/L — AB (ref 0–40)
Albumin: 4.4 g/dL (ref 3.5–5.5)
BILIRUBIN TOTAL: 0.3 mg/dL (ref 0.0–1.2)
BILIRUBIN, DIRECT: 0.09 mg/dL (ref 0.00–0.40)
Total Protein: 7.1 g/dL (ref 6.0–8.5)

## 2018-01-24 LAB — BASIC METABOLIC PANEL
BUN / CREAT RATIO: 13 (ref 9–23)
BUN: 12 mg/dL (ref 6–24)
CALCIUM: 9.3 mg/dL (ref 8.7–10.2)
CHLORIDE: 107 mmol/L — AB (ref 96–106)
CO2: 23 mmol/L (ref 20–29)
CREATININE: 0.92 mg/dL (ref 0.57–1.00)
GFR, EST AFRICAN AMERICAN: 86 mL/min/{1.73_m2} (ref >59)
GFR, EST NON AFRICAN AMERICAN: 75 mL/min/{1.73_m2} (ref >59)
Glucose: 93 mg/dL (ref 65–99)
Potassium: 4.6 mmol/L (ref 3.5–5.2)
Sodium: 144 mmol/L (ref 134–144)

## 2018-01-24 LAB — LIPID PANEL
CHOLESTEROL TOTAL: 204 mg/dL — AB (ref 100–199)
Chol/HDL Ratio: 3.4 ratio (ref 0.0–4.4)
HDL: 60 mg/dL (ref >39)
LDL Calculated: 107 mg/dL — ABNORMAL HIGH (ref 0–99)
TRIGLYCERIDES: 184 mg/dL — AB (ref 0–149)
VLDL Cholesterol Cal: 37 mg/dL (ref 5–40)

## 2018-01-24 LAB — T4, FREE: FREE T4: 1.57 ng/dL (ref 0.82–1.77)

## 2018-01-24 LAB — T3, FREE: T3, Free: 3.2 pg/mL (ref 2.0–4.4)

## 2018-01-24 LAB — TSH: TSH: 0.379 u[IU]/mL — AB (ref 0.450–4.500)

## 2018-01-30 ENCOUNTER — Ambulatory Visit: Payer: Self-pay | Admitting: Cardiovascular Disease

## 2018-02-28 DIAGNOSIS — E890 Postprocedural hypothyroidism: Secondary | ICD-10-CM | POA: Diagnosis present

## 2018-03-26 ENCOUNTER — Encounter: Payer: Self-pay | Admitting: Cardiovascular Disease

## 2018-03-27 ENCOUNTER — Encounter: Payer: Self-pay | Admitting: Cardiovascular Disease

## 2018-03-27 NOTE — Telephone Encounter (Signed)
Left message for to to call back re: her MY CHART message.

## 2018-03-31 ENCOUNTER — Encounter: Payer: Self-pay | Admitting: Cardiovascular Disease

## 2018-04-01 ENCOUNTER — Ambulatory Visit (INDEPENDENT_AMBULATORY_CARE_PROVIDER_SITE_OTHER): Payer: BLUE CROSS/BLUE SHIELD | Admitting: Cardiovascular Disease

## 2018-04-01 ENCOUNTER — Encounter: Payer: Self-pay | Admitting: Cardiovascular Disease

## 2018-04-01 VITALS — BP 94/58 | HR 92 | Ht 64.0 in | Wt 176.8 lb

## 2018-04-01 DIAGNOSIS — J8410 Pulmonary fibrosis, unspecified: Secondary | ICD-10-CM

## 2018-04-01 DIAGNOSIS — R0789 Other chest pain: Secondary | ICD-10-CM | POA: Diagnosis not present

## 2018-04-01 NOTE — Patient Instructions (Signed)
Medication Instructions:  Your physician recommends that you continue on your current medications as directed. Please refer to the Current Medication list given to you today.   Labwork: TODAY - CBC, BMET   Testing/Procedures: Your physician has recommended that you have a pulmonary function test. Pulmonary Function Tests are a group of tests that measure how well air moves in and out of your lungs.   Your physician has requested that you have a cardiac catheterization. Cardiac catheterization is used to diagnose and/or treat various heart conditions. Doctors may recommend this procedure for a number of different reasons. The most common reason is to evaluate chest pain. Chest pain can be a symptom of coronary artery disease (CAD), and cardiac catheterization can show whether plaque is narrowing or blocking your heart's arteries. This procedure is also used to evaluate the valves, as well as measure the blood flow and oxygen levels in different parts of your heart. For further information please visit [email or web address]. Please follow instruction sheet, as given.   Follow-Up: Your physician recommends that you schedule a follow-up appointment in: 3 months with Dr. Tracey Harries [facility] CARD[facility]OVASC[facility] DIV[facility] [place] OFFICE [address], [place] [address] Dept: [phone] Loc: [place]  04/01/2018  You are scheduled for a Cardiac Catheterization on Wednesday, July 3 with Dr. Lauree Chandler.  1. Please arrive at the [facility] (Main Entrance A) at [facility]: [address] [place], [place] [address] at 11:00 AM (two hours before your procedure to ensure your preparation). Free valet parking service is available.   Special note: Every effort is made to have your procedure done on time. Please understand that emergencies sometimes delay scheduled procedures.  2. Diet: No solid food  after midnight. You may have clear liquids until 5 AM then nothing except sip of water with medications  3. Labs: You will need to have blood drawn on Tuesday, July 2 at [facility]. [address]  Open: 7:30am - 5pm    Phone: [phone]. You do not need to be fasting.  4. Medication instructions in preparation for your procedure:  DO NOT TAKE LASIX (FUROSEMIDE) OR POTASSIUM TOMORROW MORNING  On the morning of your procedure, take Aspirin 81 mg and any morning medicines NOT listed above.  You may use sips of water.  5. Plan for one night stay--bring personal belongings. 6. Bring a current list of your medications and current insurance cards. 7. You MUST have a responsible person to drive you home. 8. Someone MUST be with you the first 24 hours after you arrive home or your discharge will be delayed. 9. Please wear clothes that are easy to get on and off and wear slip-on shoes.  Thank you for allowing [place] to care for you!   -- [facility]   If you need a refill on your cardiac medications before your next appointment, please call your pharmacy.   Thank you for choosing [facility]! Christen Bame, RN [phone]

## 2018-04-01 NOTE — Progress Notes (Signed)
Cardiology Office Note   Date:  04/01/2018   ID:  [name], DOB 1970-10-14, MRN [id number]  PCP:  Ardith Dark, PA-C  Cardiologist:   Mertie Moores, MD   Chief Complaint  Patient presents with  . Chest Pain   Problem List:  1. Palpitations/premature ventricular contractions 2. history of Hodgkin's lymphoma-status post mantle radiation 3. History of breast cancer-status post Reconstruction 4. History of cervical cancer 5. 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 2015 for regrowth of tumor.  6. History of thyroidectomy - hx of thyroid cancer  7. Raynaud's phenomenon 8. Appendectomy 9. History chest pains-normal heart catheterization, normal TEE 10. history of adrenal insufficiency  11. Hyperlipidemia   History of Present Illness:  [name] is done very well since I last saw her about a year ago. She has had some occasional palpitations that are typically controlled with metoprolol. She's not had any episodes of chest pain. She's been able to stop all of her steroids. Her adrenal glands have gradually improved and her now producing cortisol.  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 She is still working at [employer].   Feb 24, 2013:  She has done well since I last saw her . She has had her breast implants replaced ( old ones were leaking). Her costochondritis has resolved. She has not been lifting.   She has run in 6 5K races this year. She is playing tennis. She is still in [employer] school. She is doing her orthopedic rotation.   Nov. 11, 2014:  Her HR has been high. She has a head ache frequently. She started Adderall recently.  The adderall has help with her focus but she feels much worse in it. She has been on the Adderall for 2 months and the tachycardia started about 1 week ago.  She was doing cycle classes 3 times a  week. She is exercising regularly and for the past week, her HR is extremely fast. She has gone into menapause and has lots of hot flashes.   Feb. 23, 2015:  She has been having some recent episodes of tachycardia. We increased her metoprolol at the last visit. We performed an echocardiogram which revealed  Left ventricle: The cavity size was normal. Systolic function was normal. The estimated ejection fraction was in the range of 55% to 60%. Wall motion was normal; there were no regional wall motion abnormalities. Left ventricular diastolic function parameters were normal. - Aortic valve: Trivial regurgitation. - Mitral valve: Trivial regurgitation  She tried to stop her Adderall ( did not do well with that). She decreased her dose slightly and she is not having much tachycardia. She has been   She has been found to have some osteoperosis and osteopenia.   March 09, 2014:  [name] is doing ok.. No recent cardiac problems. She has been very active and is feeling great. Playing tennis on a regular basis.     Feb. 17, 2016:   [name] is a 47 y.o. female who presents for for evaluation of palpitations , hypotension. Echo yesterday  Left ventricle: The cavity size was normal. Systolic function was normal. The estimated ejection fraction was in the range of 55% to 60%. Wall motion was normal; there were no regional wall motion abnormalities. - Aortic valve: There was trivial regurgitation. - Mitral valve: Calcified annulus.  Labs were ordered yesterday but she  was not able to get them. Continues to have palpitations today She is off her Estrogen at this point.  She is back on the Toprol - skipped 2 days.  Has been taking the PRN propranolol  Not feeling well and has not been eating or drinking well. We given 1 dose of lasix   December 15, 2014:  She has continued to have palpitations  - last 20-30 seconds .  HR has remained fairly high. Has been working out  regularly .  These are going ok.    Feb 03, 2015: [name] continues to have palpitations . She wore a monitor but actually only wore is for about 1/2 of the time ( she was at [employer]) We have discussed implantable loop.  I have some concerns about doing any procedures on her .  She has only been taking the toprol 50 mg a day instead of 75.   May 17, 2015:  [name] is seen today for follow up. She has a history of hyperlipidemia and we sent her to lipid clinic. She is tolerating the Crestor very well.   Still having palpitations but learning how to manage .  Oct. 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 Has been seen at [facility] and is now at [facility]  She has sent scans to [facility]. Is on steroids to shrink tumor, has gained lots of weight.  Is hoping to be able to get a bx so that she have this tumor treated.   She is now on home O2.  O2 sats dropped to 78 while sleeping on room air.    O2 sats on 2.5 liters are good. She uses 4 lpm at night .   Is having more palpitations. Would like a refill for propranolol'   April 18, 2017:  [name] is seen today  Is still on home O2.,  Has chronic respiratory failure ( unclear etiology at this point )   Has RV failure.   Had a right heart cath.  PA pressures were only minimally elevated.  Had significant leg swelling   Has been on steroids.  Has developed pre-diabetes  Nov. 15,, 2018  In Sept 25,she woke up from a nap Ad tingling and numbness in her legs , had no strength in her legs . 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   2nd MRI of back showed a lesion between T10 -T 11.   Nothing that truly showed compression.  Was transferred to rehab unit in [place].    Has regained some motor ability I her lower abd.   Ruptured  her left breast implant using her arms in rehab  Scheduled to have her implants removed later this month .   Still on home O2,  Palpitations have been well controlled.   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 ( Bronch in Dec. 2018)    Has pulmonary HTN.  Lasix is no working for her at this point  - takes Lasix 20 mg a day   Is using a walker now - was in a wheelchair last time  Had temporary paralysis of her legs.  Improving .  Regaining her strength.    April 01, 2018  [name] is seen today as a work in visit . She was recently seen by her primary medical doctor.  Echocardiogram revealed new wall motion abnormalities and an ejection fraction of approximately 35%, this is this is down compared to previous echocardiogram from 2017.  She also has been having some chest pain.  There was also some comment that her systolic murmur might be louder. The echocardiograms also relate revealed increased pulmonary pressures compared to the previous echo.  Has strength has improved.   Is now walking with a walker .    Breathing is gradually worsening.   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    Pressure like someone is sitting on her chest.     Past Medical History:  Diagnosis Date  . Addison's disease ([place])   . Adrenal insufficiency ([place])   . Aortic stenosis   . Aortic stenosis   . Appendicitis 12/19/09  . Breast cancer ([place])    STATUS POST BILATERAL MASTECTOMY. STATUS POST RECONSTRUCTION. SHE HAD SILICONE BREAST IMPLANTS AND THE LEFT IMPLANT IS LEAKING SLIGHTLY  . Chest pain   . CHF with right heart failure ([place]) 04/17/2017  . GERD (gastroesophageal reflux disease)   . Hodgkin lymphoma ([facility])    STATUS POST MANTLE RADIATION  . Hypertension   . Osteoporosis   . Palpitations   . Raynaud phenomenon   . SVT (supraventricular tachycardia) ([place])   . Tachycardia   .  Thyroid cancer ([place])    STATUS POST SURGICAL REMOVAL-CURRENT ON THYROID REPLACEMENT    Past Surgical History:  Procedure Laterality Date  . ABDOMINAL HYSTERECTOMY    . APPENDECTOMY    . CARDIAC CATHETERIZATION  05/18/09   NORMAL CATH  . PITUITARY SURGERY       Current Outpatient Medications  Medication Sig Dispense Refill  . albuterol (VENTOLIN HFA) 108 (90 Base) MCG/ACT inhaler Inhale 2 puffs into the lungs every 6 (six) hours as needed for wheezing or shortness of breath.    . Alum & Mag Hydroxide-Simeth (GI COCKTAIL) SUSP suspension Take 30 mLs daily as needed by mouth for indigestion (BLEEDING ULCERS). Shake well.    . amphetamine-dextroamphetamine (ADDERALL) 20 MG tablet Take 20 mg by mouth 2 (two) times daily.    . beclomethasone (QVAR) 80 MCG/ACT inhaler Inhale 2 puffs into the lungs 2 (two) times daily.    Marland Kitchen BREO ELLIPTA 200-25 MCG/INH AEPB INHALE 1 INHALATION INTO THE LUNGS ONCE DAILY.  11  . clonazePAM (KLONOPIN) 1 MG tablet Take 1 mg by mouth 3 (three) times daily.     Marland Kitchen dexlansoprazole (DEXILANT) 60 MG capsule Take 60 mg by mouth daily.      . Diclofenac Potassium (CAMBIA) 50 MG PACK Take 1 packet by mouth 3 (three) times daily as needed. AS NEEDED FOR HEADACHES    . escitalopram (LEXAPRO) 20 MG tablet Take 30 mg by mouth daily.   1  . estradiol (ESTRACE) 2 MG tablet Take 2 mg by mouth daily.  11  . ferrous sulfate 325 (65 FE) MG EC tablet Take by mouth.    . Ferrous Sulfate Dried (SLOW RELEASE IRON) 45 MG TBCR Take 1 tablet at bedtime by mouth.    . furosemide (LASIX) 40 MG tablet Take 1 tablet (40 mg total) by mouth daily. 90 tablet 3  . hydrocortisone (CORTEF) 10 MG tablet Take 5 mg by mouth 2 (two) times daily. Take 10 mg in the am and 5 mg  in the pm    . ibuprofen (ADVIL,MOTRIN) 200 MG tablet Take 600 mg by mouth every 6 (six) hours as needed for moderate pain.    Marland Kitchen levothyroxine (SYNTHROID, LEVOTHROID) 112 MCG tablet Take 112 mcg by mouth daily before breakfast.    .  metoprolol succinate (TOPROL-XL) 25 MG 24 hr tablet Take 37.5 mg by mouth daily.    . potassium chloride (K-DUR) 10 MEQ tablet Take 1 tablet (10 mEq total) by mouth daily. 90 tablet 3  . rosuvastatin (CRESTOR) 10 MG tablet Take 1 tablet by mouth daily.    . sucralfate (CARAFATE) 1 g tablet Take 1 g as needed by mouth (FOR ULCERS).    Marland Kitchen topiramate (TOPAMAX) 100 MG tablet Take 100 mg by mouth.    . traMADol (ULTRAM) 50 MG tablet Take 50 mg by mouth daily as needed. AS NEEDED FOR PAIN    . zolpidem (AMBIEN CR) 12.5 MG CR tablet Take 12.5 mg by mouth at bedtime.       No current facility-administered medications for this visit.     Allergies:   Cymbalta [duloxetine hcl]; Buprenorphine hcl; Compazine; Dilaudid [hydromorphone hcl]; Hydromorphone; Metoclopramide; Morphine and related; Na ferric gluc cplx in sucrose; Promethazine hcl; Metoclopramide hcl; Ondansetron; and Tegaderm ag mesh [silver]    Social History:  The patient  reports that she has never smoked. She has never used smokeless tobacco. She reports that she drinks alcohol. She reports that she does not use drugs.   Family History:  The patient's Family history is unknown by patient.    ROS:    Noted in current hx   Physical Exam: Blood pressure (!) 94/58, pulse 92, height _0  (1.626 m), weight 176 lb 12.8 oz (80.2 kg), SpO2 94 %.  GEN:  Middle age female,    HEENT: Normal NECK: No JVD; No carotid bruits LYMPHATICS: No lymphadenopathy CARDIAC: RR, soft systolic murmur  RESPIRATORY:  Clear to auscultation without rales, wheezing or rhonchi  ABDOMEN:  distended MUSCULOSKELETAL:  Trace edema  SKIN: Warm and dry NEUROLOGIC:  Alert and oriented x 3   EKG:   April 01, 2018 : NSR at 92.   No ST or T wave changes.    Recent Labs: 01/23/2018: ALT 41; BUN 12; Creatinine, Ser 0.92; Potassium 4.6; Sodium 144; TSH 0.379    Lipid Panel    Component Value Date/Time   CHOL 204 (H) 01/23/2018 1225   CHOL 207 (H) 12/08/2013 0823    TRIG 184 (H) 01/23/2018 1225   TRIG 96 12/08/2013 0823   HDL 60 01/23/2018 1225   HDL 57 12/08/2013 0823   CHOLHDL 3.4 01/23/2018 1225   CHOLHDL 3 05/16/2015 0933   VLDL 21.4 05/16/2015 0933   LDLCALC 107 (H) 01/23/2018 1225   LDLCALC 131 (H) 12/08/2013 0823      Wt Readings from Last 3 Encounters:  04/01/18 176 lb 12.8 oz (80.2 kg)  01/23/18 178 lb (80.7 kg)  08/15/17 176 lb (79.8 kg)      Other studies Reviewed: Additional studies/ records that were reviewed today include: . Review of the above records demonstrates:    ASSESSMENT AND PLAN:  Leg paralysis: Slowly impronving .   -  Right heart failure:     She is having progressive shortness of breath over the past couple months.  I am worried that she may have developed pulmonary hypertension.  She also has had some chest tightness.  We will schedule her for a right and left heart  catheterization. An echocardiogram suggest some wall motion abnormalities.  I do not have the echocardiogram to review.  I discussed the risks, benefits, and options concerning heart catheterization.  She understands and agrees to proceed.  - Palpitations/premature ventricular contractions -   Seem to be stable   - history of Hodgkin's lymphoma-status post mantle radiation   - History of breast cancer-status post Reconstruction Has recurrent breast cancer.   With mets to lungs, liver and  brain .    4. History of cervical cancer 5. 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 2015 for regrowth of tumor.  6. History of thyroidectomy - hx of thyroid cancer ,    Check TSH, free T4, free  T3  7. Raynaud's phenomenon 8. Appendectomy . 10. history of adrenal insufficiency    Current medicines are reviewed at length with the patient today.  The patient does not have concerns regarding medicines.  The following changes have been made:  no change  Disposition:   FU with me in me in 3   months.    Signed, Mertie Moores, MD  04/01/2018 3:10 PM    [facility] [address], [place], [place]  [address] Phone: [phone]; Fax: [phone] ekg

## 2018-04-02 ENCOUNTER — Ambulatory Visit (HOSPITAL_COMMUNITY)
Admission: RE | Disposition: A | Discharge status: Home / Self Care | Payer: Self-pay | Source: Ambulatory Visit | Attending: Cardiovascular Disease

## 2018-04-02 ENCOUNTER — Ambulatory Visit (HOSPITAL_COMMUNITY)
Admission: RE | Admit: 2018-04-02 | Discharge: 2018-04-02 | Disposition: A | Discharge status: Home / Self Care | Payer: BLUE CROSS/BLUE SHIELD | Source: Ambulatory Visit | Attending: Cardiovascular Disease | Admitting: Cardiovascular Disease

## 2018-04-02 DIAGNOSIS — R0789 Other chest pain: Secondary | ICD-10-CM | POA: Diagnosis not present

## 2018-04-02 DIAGNOSIS — Z8571 Personal history of Hodgkin lymphoma: Secondary | ICD-10-CM | POA: Insufficient documentation

## 2018-04-02 DIAGNOSIS — I2729 Other secondary pulmonary hypertension: Secondary | ICD-10-CM | POA: Diagnosis not present

## 2018-04-02 DIAGNOSIS — I428 Other cardiomyopathies: Secondary | ICD-10-CM | POA: Diagnosis not present

## 2018-04-02 DIAGNOSIS — Z7951 Long term (current) use of inhaled steroids: Secondary | ICD-10-CM | POA: Diagnosis not present

## 2018-04-02 DIAGNOSIS — Z9882 Breast implant status: Secondary | ICD-10-CM | POA: Insufficient documentation

## 2018-04-02 DIAGNOSIS — Z888 Allergy status to other drugs, medicaments and biological substances status: Secondary | ICD-10-CM | POA: Insufficient documentation

## 2018-04-02 DIAGNOSIS — Z79899 Other long term (current) drug therapy: Secondary | ICD-10-CM | POA: Insufficient documentation

## 2018-04-02 DIAGNOSIS — E2740 Unspecified adrenocortical insufficiency: Secondary | ICD-10-CM | POA: Insufficient documentation

## 2018-04-02 DIAGNOSIS — I471 Supraventricular tachycardia: Secondary | ICD-10-CM | POA: Insufficient documentation

## 2018-04-02 DIAGNOSIS — Z9013 Acquired absence of bilateral breasts and nipples: Secondary | ICD-10-CM | POA: Diagnosis not present

## 2018-04-02 DIAGNOSIS — Z885 Allergy status to narcotic agent status: Secondary | ICD-10-CM | POA: Insufficient documentation

## 2018-04-02 DIAGNOSIS — Z9889 Other specified postprocedural states: Secondary | ICD-10-CM | POA: Insufficient documentation

## 2018-04-02 DIAGNOSIS — K219 Gastro-esophageal reflux disease without esophagitis: Secondary | ICD-10-CM | POA: Insufficient documentation

## 2018-04-02 DIAGNOSIS — Z853 Personal history of malignant neoplasm of breast: Secondary | ICD-10-CM | POA: Insufficient documentation

## 2018-04-02 DIAGNOSIS — I50810 Right heart failure, unspecified: Secondary | ICD-10-CM | POA: Insufficient documentation

## 2018-04-02 DIAGNOSIS — M810 Age-related osteoporosis without current pathological fracture: Secondary | ICD-10-CM | POA: Insufficient documentation

## 2018-04-02 DIAGNOSIS — Z85850 Personal history of malignant neoplasm of thyroid: Secondary | ICD-10-CM | POA: Insufficient documentation

## 2018-04-02 DIAGNOSIS — I110 Hypertensive heart disease with heart failure: Secondary | ICD-10-CM | POA: Insufficient documentation

## 2018-04-02 DIAGNOSIS — I7300 Raynaud's syndrome without gangrene: Secondary | ICD-10-CM | POA: Diagnosis not present

## 2018-04-02 DIAGNOSIS — Z79890 Hormone replacement therapy (postmenopausal): Secondary | ICD-10-CM | POA: Diagnosis not present

## 2018-04-02 DIAGNOSIS — I350 Nonrheumatic aortic (valve) stenosis: Secondary | ICD-10-CM | POA: Diagnosis not present

## 2018-04-02 DIAGNOSIS — Z90710 Acquired absence of both cervix and uterus: Secondary | ICD-10-CM | POA: Insufficient documentation

## 2018-04-02 HISTORY — PX: RIGHT/LEFT HEART CATH AND CORONARY ANGIOGRAPHY: CATH118266

## 2018-04-02 LAB — CBC
Hematocrit: 39.9 % (ref 34.0–46.6)
Hemoglobin: 13.3 g/dL (ref 11.1–15.9)
MCH: 29.8 pg (ref 26.6–33.0)
MCHC: 33.3 g/dL (ref 31.5–35.7)
MCV: 89 fL (ref 79–97)
PLATELETS: 327 10*3/uL (ref 150–450)
RBC: 4.47 x10E6/uL (ref 3.77–5.28)
RDW: 13.6 % (ref 12.3–15.4)
WBC: 7.4 10*3/uL (ref 3.4–10.8)

## 2018-04-02 LAB — BASIC METABOLIC PANEL
BUN/Creatinine Ratio: 10 (ref 9–23)
BUN: 11 mg/dL (ref 6–24)
CO2: 23 mmol/L (ref 20–29)
Calcium: 9.3 mg/dL (ref 8.7–10.2)
Chloride: 104 mmol/L (ref 96–106)
Creatinine, Ser: 1.08 mg/dL — ABNORMAL HIGH (ref 0.57–1.00)
GFR calc non Af Amer: 61 mL/min/{1.73_m2} (ref >59)
GFR, EST AFRICAN AMERICAN: 71 mL/min/{1.73_m2} (ref >59)
Glucose: 91 mg/dL (ref 65–99)
POTASSIUM: 4.3 mmol/L (ref 3.5–5.2)
SODIUM: 141 mmol/L (ref 134–144)

## 2018-04-02 LAB — POCT I-STAT 3, VENOUS BLOOD GAS (G3P V)
Acid-base deficit: 6 mmol/L — ABNORMAL HIGH (ref 0.0–2.0)
Bicarbonate: 20.8 mmol/L (ref 20.0–28.0)
O2 SAT: 65 %
TCO2: 22 mmol/L (ref 22–32)
pCO2, Ven: 45.4 mmHg (ref 44.0–60.0)
pH, Ven: 7.269 (ref 7.250–7.430)
pO2, Ven: 38 mmHg (ref 32.0–45.0)

## 2018-04-02 LAB — POCT I-STAT 3, ART BLOOD GAS (G3+)
ACID-BASE DEFICIT: 6 mmol/L — AB (ref 0.0–2.0)
BICARBONATE: 19.9 mmol/L — AB (ref 20.0–28.0)
O2 Saturation: 99 %
PH ART: 7.296 — AB (ref 7.350–7.450)
TCO2: 21 mmol/L — AB (ref 22–32)
pCO2 arterial: 40.8 mmHg (ref 32.0–48.0)
pO2, Arterial: 140 mmHg — ABNORMAL HIGH (ref 83.0–108.0)

## 2018-04-02 SURGERY — RIGHT/LEFT HEART CATH AND CORONARY ANGIOGRAPHY
Anesthesia: LOCAL

## 2018-04-02 MED ORDER — FENTANYL CITRATE (PF) 100 MCG/2ML IJ SOLN
INTRAMUSCULAR | Status: DC | PRN
  Administered 2018-04-02 (×4): 25 ug via INTRAVENOUS

## 2018-04-02 MED ORDER — MIDAZOLAM HCL 2 MG/2ML IJ SOLN
INTRAMUSCULAR | Status: AC
Start: 2018-04-02 — End: ?
  Filled 2018-04-02: qty 2

## 2018-04-02 MED ORDER — SODIUM CHLORIDE 0.9% FLUSH: 3.0000 mL | INTRAVENOUS | Status: DC | PRN

## 2018-04-02 MED ORDER — SODIUM CHLORIDE 0.9% FLUSH: 3.0000 mL | Freq: Two times a day (BID) | INTRAVENOUS | Status: DC

## 2018-04-02 MED ORDER — MIDAZOLAM HCL 2 MG/2ML IJ SOLN
INTRAMUSCULAR | Status: AC
  Filled 2018-04-02: qty 2

## 2018-04-02 MED ORDER — ASPIRIN 81 MG PO CHEW: 81.0000 mg | CHEWABLE_TABLET | ORAL | Status: DC

## 2018-04-02 MED ORDER — ASPIRIN 81 MG PO CHEW
CHEWABLE_TABLET | ORAL | Status: AC
  Filled 2018-04-02: qty 1

## 2018-04-02 MED ORDER — MIDAZOLAM HCL 2 MG/2ML IJ SOLN
INTRAMUSCULAR | Status: DC | PRN
  Administered 2018-04-02 (×4): 1 mg via INTRAVENOUS

## 2018-04-02 MED ORDER — LIDOCAINE HCL (PF) 1 % IJ SOLN
INTRAMUSCULAR | Status: AC
  Filled 2018-04-02: qty 30

## 2018-04-02 MED ORDER — HYDROCORTISONE NA SUCCINATE PF 100 MG IJ SOLR
100.0000 mg | Freq: Once | INTRAMUSCULAR | Status: AC
  Administered 2018-04-02: 100 mg via INTRAVENOUS
  Filled 2018-04-02 (×2): qty 2

## 2018-04-02 MED ORDER — DIPHENHYDRAMINE HCL 50 MG/ML IJ SOLN
INTRAMUSCULAR | Status: AC
  Filled 2018-04-02: qty 1

## 2018-04-02 MED ORDER — HYDROMORPHONE HCL 1 MG/ML IJ SOLN
1.0000 mg | Freq: Once | INTRAMUSCULAR | Status: AC
  Administered 2018-04-02: 1 mg via INTRAVENOUS

## 2018-04-02 MED ORDER — SODIUM CHLORIDE 0.9 % IV SOLN: INTRAVENOUS | Status: DC

## 2018-04-02 MED ORDER — HYDROMORPHONE HCL 1 MG/ML IJ SOLN
INTRAMUSCULAR | Status: AC
  Filled 2018-04-02: qty 0.5

## 2018-04-02 MED ORDER — SODIUM CHLORIDE 0.9 % IV SOLN: 250.0000 mL | INTRAVENOUS | Status: DC | PRN

## 2018-04-02 MED ORDER — FENTANYL CITRATE (PF) 100 MCG/2ML IJ SOLN
INTRAMUSCULAR | Status: AC
  Filled 2018-04-02: qty 2

## 2018-04-02 MED ORDER — LIDOCAINE HCL (PF) 1 % IJ SOLN
INTRAMUSCULAR | Status: DC | PRN
  Administered 2018-04-02 (×2): 15 mL

## 2018-04-02 MED ORDER — SODIUM CHLORIDE 0.9 % WEIGHT BASED INFUSION
3.0000 mL/kg/h | INTRAVENOUS | Status: AC
  Administered 2018-04-02: 3 mL/kg/h via INTRAVENOUS

## 2018-04-02 MED ORDER — METHYLPREDNISOLONE SODIUM SUCC 125 MG IJ SOLR
INTRAMUSCULAR | Status: AC
  Filled 2018-04-02: qty 2

## 2018-04-02 MED ORDER — ONDANSETRON HCL 4 MG/2ML IJ SOLN: 4.0000 mg | Freq: Four times a day (QID) | INTRAMUSCULAR | Status: DC | PRN

## 2018-04-02 MED ORDER — ACETAMINOPHEN 325 MG PO TABS: 650.0000 mg | ORAL_TABLET | ORAL | Status: DC | PRN

## 2018-04-02 MED ORDER — IOHEXOL 350 MG/ML SOLN
INTRAVENOUS | Status: DC | PRN
  Administered 2018-04-02: 35 mL

## 2018-04-02 MED ORDER — ASPIRIN 81 MG PO CHEW
CHEWABLE_TABLET | ORAL | Status: DC | PRN
  Administered 2018-04-02: 81 mg via ORAL

## 2018-04-02 MED ORDER — DIPHENHYDRAMINE HCL 50 MG/ML IJ SOLN
25.0000 mg | Freq: Once | INTRAMUSCULAR | Status: AC
  Administered 2018-04-02: 50 mg via INTRAVENOUS

## 2018-04-02 MED ORDER — HEPARIN (PORCINE) IN NACL 1000-0.9 UT/500ML-% IV SOLN
INTRAVENOUS | Status: AC
  Filled 2018-04-02: qty 1000

## 2018-04-02 MED ORDER — SODIUM CHLORIDE 0.9 % WEIGHT BASED INFUSION: 1.0000 mL/kg/h | INTRAVENOUS | Status: DC

## 2018-04-02 MED ORDER — DIPHENHYDRAMINE HCL 50 MG/ML IJ SOLN
INTRAMUSCULAR | Status: DC | PRN
  Administered 2018-04-02: 50 mg via INTRAVENOUS

## 2018-04-02 MED ORDER — VERAPAMIL HCL 2.5 MG/ML IV SOLN
INTRAVENOUS | Status: AC
  Filled 2018-04-02: qty 2

## 2018-04-02 MED ORDER — HEPARIN (PORCINE) IN NACL 2-0.9 UNITS/ML
INTRAMUSCULAR | Status: AC | PRN
  Administered 2018-04-02 (×2): 500 mL

## 2018-04-02 SURGICAL SUPPLY — 17 items
CATH INFINITI 5FR MULTPACK ANG (CATHETERS) ×1 IMPLANT
CATH SWAN GANZ 7F STRAIGHT (CATHETERS) ×2 IMPLANT
COVER PRB 48X5XTLSCP FOLD TPE (BAG) IMPLANT
COVER PROBE 5X48 (BAG) ×2
GLIDESHEATH SLEND SS 6F .021 (SHEATH) ×1 IMPLANT
GUIDEWIRE INQWIRE 1.5J.035X260 (WIRE) IMPLANT
INQWIRE 1.5J .035X260CM (WIRE) ×2
KIT HEART LEFT (KITS) ×2 IMPLANT
KIT MICROPUNCTURE NIT STIFF (SHEATH) ×1 IMPLANT
NDL PERC 21GX4CM (NEEDLE) IMPLANT
NEEDLE PERC 21GX4CM (NEEDLE) ×2 IMPLANT
PACK CARDIAC CATHETERIZATION (CUSTOM PROCEDURE TRAY) ×2 IMPLANT
SHEATH PINNACLE 5F 10CM (SHEATH) ×1 IMPLANT
SHEATH PINNACLE 7F 10CM (SHEATH) ×1 IMPLANT
TRANSDUCER W/STOPCOCK (MISCELLANEOUS) ×2 IMPLANT
TUBING CIL FLEX 10 FLL-RA (TUBING) ×2 IMPLANT
WIRE EMERALD 3MM-J .025X260CM (WIRE) ×1 IMPLANT

## 2018-04-02 NOTE — Discharge Instructions (Signed)
Angiogram, Care After This sheet gives you information about how to care for yourself after your procedure. Your health care provider may also give you more specific instructions. If you have problems or questions, contact your health care provider. What can I expect after the procedure? After the procedure, it is common to have bruising and tenderness at the catheter insertion area. Follow these instructions at home: Insertion site care  Follow instructions from your health care provider about how to take care of your insertion site. Make sure you: ? Wash your hands with soap and water before you change your bandage (dressing). If soap and water are not available, use hand sanitizer. ? Change your dressing as told by your health care provider. ? Leave stitches (sutures), skin glue, or adhesive strips in place. These skin closures may need to stay in place for 2 weeks or longer. If adhesive strip edges start to loosen and curl up, you may trim the loose edges. Do not remove adhesive strips completely unless your health care provider tells you to do that.  Do not take baths, swim, or use a hot tub until your health care provider approves.  You may shower 24-48 hours after the procedure or as told by your health care provider. ? Gently wash the site with plain soap and water. ? Pat the area dry with a clean towel. ? Do not rub the site. This may cause bleeding.  Do not apply powder or lotion to the site. Keep the site clean and dry.  Check your insertion site every day for signs of infection. Check for: ? Redness, swelling, or pain. ? Fluid or blood. ? Warmth. ? Pus or a bad smell. Activity  Rest as told by your health care provider, usually for 1-2 days.  Do not lift anything that is heavier than 10 lbs. (4.5 kg) or as told by your health care provider.  Do not drive for 24 hours if you were given a medicine to help you relax (sedative).  Do not drive or use heavy machinery while  taking prescription pain medicine. General instructions  Return to your normal activities as told by your health care provider, usually in about a week. Ask your health care provider what activities are safe for you.  If the catheter site starts bleeding, lie flat and put pressure on the site. If the bleeding does not stop, get help right away. This is a medical emergency.  Drink enough fluid to keep your urine clear or pale yellow. This helps flush the contrast dye from your body.  Take over-the-counter and prescription medicines only as told by your health care provider.  Keep all follow-up visits as told by your health care provider. This is important. Contact a health care provider if:  You have a fever or chills.  You have redness, swelling, or pain around your insertion site.  You have fluid or blood coming from your insertion site.  The insertion site feels warm to the touch.  You have pus or a bad smell coming from your insertion site.  You have bruising around the insertion site.  You notice blood collecting in the tissue around the catheter site (hematoma). The hematoma may be painful to the touch. Get help right away if:  You have severe pain at the catheter insertion area.  The catheter insertion area swells very fast.  The catheter insertion area is bleeding, and the bleeding does not stop when you hold steady pressure on the area.  The area near or just beyond the catheter insertion site becomes pale, cool, tingly, or numb. These symptoms may represent a serious problem that is an emergency. Do not wait to see if the symptoms will go away. Get medical help right away. Call your local emergency services (911 in the [place] Do not drive yourself to the hospital. Summary  After the procedure, it is common to have bruising and tenderness at the catheter insertion area.  After the procedure, it is important to rest and drink plenty of fluids.  Do not take baths,  swim, or use a hot tub until your health care provider says it is okay to do so. You may shower 24-48 hours after the procedure or as told by your health care provider.  If the catheter site starts bleeding, lie flat and put pressure on the site. If the bleeding does not stop, get help right away. This is a medical emergency. This information is not intended to replace advice given to you by your health care provider. Make sure you discuss any questions you have with your health care provider. Document Released: 04/05/2005 Document Revised: 08/22/2016 Document Reviewed: 08/22/2016 Elsevier Interactive Patient Education  [employer].

## 2018-04-02 NOTE — Progress Notes (Addendum)
Site area: Right groin a 5 french arterial and 4 french venous sheath was removed  Site Prior to Removal:  Level 0  Pressure Applied For 20 MINUTES    Bedrest Beginning at 1450p  Manual:   Yes.    Patient Status During Pull:  stable  Post Pull Groin Site:  Level 0  Post Pull Instructions Given:  Yes.    Post Pull Pulses Present:  Yes.    Dressing Applied:  Yes.    Comments:  VS remain stable

## 2018-04-02 NOTE — H&P (Signed)
Cardiology Admission History and Physical:   Patient ID: [name]; MRN: [id number]; DOB: 1971-04-03   Admission date: 04/02/2018  Primary Care Provider: Ardith Dark, PA-C Primary Cardiologist: Mertie Moores, MD    Chief Complaint:  Here for cardiac cath/Chest pain  Patient Profile/HPI   [name] is a 47 y.o. female with a history of Addison's disease, aortic stenosis, breast cancer, GERD, Hodgkins Lymphoma, HTN, SVT, thyroid cancer, pituitary tumor who is followed by Dr. Acie Fredrickson and has been having chest pain worrisome for angina. LVEF noted to be 35% on outside echo recently. Her systolic murmur was noted to be louder. I do not see an echo report to specifically address her valves. PA pressure noted to be higher on echo.   She has been having chest pains.    Past Medical History:  Diagnosis Date  . Addison's disease ([place])   . Adrenal insufficiency ([place])   . Aortic stenosis   . Aortic stenosis   . Appendicitis 12/19/09  . Breast cancer ([place])    STATUS POST BILATERAL MASTECTOMY. STATUS POST RECONSTRUCTION. SHE HAD SILICONE BREAST IMPLANTS AND THE LEFT IMPLANT IS LEAKING SLIGHTLY  . Chest pain   . CHF with right heart failure ([facility]) 04/17/2017  . GERD (gastroesophageal reflux disease)   . Hodgkin lymphoma ([facility])    STATUS POST MANTLE RADIATION  . Hypertension   . Osteoporosis   . Palpitations   . Raynaud phenomenon   . SVT (supraventricular tachycardia) ([place])   . Tachycardia   . Thyroid cancer ([place] [facility])    STATUS POST SURGICAL REMOVAL-CURRENT ON THYROID REPLACEMENT    Past Surgical History:  Procedure Laterality Date  . ABDOMINAL HYSTERECTOMY    . APPENDECTOMY    . CARDIAC CATHETERIZATION  05/18/09   NORMAL CATH  . PITUITARY SURGERY       Medications Prior to Admission: Prior to Admission medications   Medication Sig Start Date End Date Taking? Authorizing Provider  albuterol (VENTOLIN HFA) 108 (90 Base) MCG/ACT inhaler Inhale 2 puffs into the lungs every 6  (six) hours as needed for wheezing or shortness of breath.   Yes [provider]  beclomethasone (QVAR) 80 MCG/ACT inhaler Inhale 2 puffs into the lungs 2 (two) times daily.   Yes [provider]  BREO ELLIPTA 200-25 MCG/INH AEPB INHALE 1 INHALATION INTO THE LUNGS ONCE DAILY. 02/10/16  Yes [provider]  clonazePAM (KLONOPIN) 1 MG tablet Take 1 mg by mouth 2 (two) times daily.    Yes [provider]  dexlansoprazole (DEXILANT) 60 MG capsule Take 60 mg by mouth daily.     Yes [provider]  escitalopram (LEXAPRO) 20 MG tablet Take 20 mg by mouth daily.  04/24/16  Yes [provider]  estradiol (ESTRACE) 2 MG tablet Take 2 mg by mouth daily. 02/01/16  Yes [provider]  Ferrous Sulfate Dried (SLOW RELEASE IRON) 45 MG TBCR Take 1 tablet at bedtime by mouth.   Yes [provider]  furosemide (LASIX) 40 MG tablet Take 1 tablet (40 mg total) by mouth daily. 01/23/18 04/23/18 Yes Nahser, Wonda Cheng, MD  hydrocortisone (CORTEF) 10 MG tablet Take 5-10 mg by mouth 2 (two) times daily. Take 10 mg in the am and 5 mg in the pm   Yes [provider]  levothyroxine (SYNTHROID, LEVOTHROID) 112 MCG tablet Take 112 mcg by mouth daily before breakfast.   Yes [provider]  metoprolol succinate (TOPROL-XL) 25 MG 24 hr tablet Take 37.5 mg  by mouth daily.   Yes [provider]  potassium chloride (K-DUR) 10 MEQ tablet Take 1 tablet (10 mEq total) by mouth daily. 01/23/18 04/23/18 Yes Nahser, Wonda Cheng, MD  rosuvastatin (CRESTOR) 10 MG tablet Take 10 mg by mouth daily.  02/28/18  Yes [provider]  topiramate (TOPAMAX) 100 MG tablet Take 150 mg by mouth at bedtime.    Yes [provider]  zolpidem (AMBIEN CR) 12.5 MG CR tablet Take 12.5 mg by mouth at bedtime.     Yes [provider]  Alum & Mag Hydroxide-Simeth (GI COCKTAIL) SUSP suspension Take 30 mLs daily as needed by mouth for indigestion  (BLEEDING ULCERS). Shake well.    [provider]  Diclofenac Potassium (CAMBIA) 50 MG PACK Take 1 packet by mouth 3 (three) times daily as needed. AS NEEDED FOR HEADACHES 10/10/15   [provider]  sucralfate (CARAFATE) 1 g tablet Take 1 g as needed by mouth (FOR ULCERS).    [provider]  traMADol (ULTRAM) 50 MG tablet Take 50 mg by mouth daily as needed (migraine).  06/28/16   [provider]     Allergies:    Allergies  Allergen Reactions  . Cymbalta [Duloxetine Hcl] Anxiety  . Hydromorphone Other (See Comments)    BP drop and heart rate drops  . Buprenorphine Hcl Hives  . Compazine     unknown  . Metoclopramide Other (See Comments)    contractures  . Morphine And Related Hives  . Na Ferric Gluc Cplx In Sucrose Other (See Comments)    unknown  . Ondansetron Rash    [place]  . Promethazine Hcl Hives  . Tegaderm Ag Mesh [Silver] Rash    Old formulation only, is able to tolerate new formulation    Social History:   Social History   Socioeconomic History  . Marital status: Married    Spouse name: Not on file  . Number of children: Not on file  . Years of education: Not on file  . Highest education level: Not on file  Occupational History  . Not on file  Social Needs  . Financial resource strain: Not on file  . Food insecurity:    Worry: Not on file    Inability: Not on file  . Transportation needs:    Medical: Not on file    Non-medical: Not on file  Tobacco Use  . Smoking status: Never Smoker  . Smokeless tobacco: Never Used  Substance and Sexual Activity  . Alcohol use: Yes    Comment: occassionally  . Drug use: No  . Sexual activity: Yes    Birth control/protection: Surgical  Lifestyle  . Physical activity:    Days per week: Not on file    Minutes per session: Not on file  . Stress: Not on file  Relationships  . Social connections:    Talks on phone: Not on file    Gets together: Not on file    Attends religious  service: Not on file    Active member of club or organization: Not on file    Attends meetings of clubs or organizations: Not on file    Relationship status: Not on file  . Intimate partner violence:    Fear of current or ex partner: Not on file    Emotionally abused: Not on file    Physically abused: Not on file    Forced sexual activity: Not on file  Other Topics Concern  . Not on  file  Social History Narrative  . Not on file    Family History:   The patient's Family history is unknown by patient.    ROS:  Please see the history of present illness.  All other ROS reviewed and negative.     Physical Exam/Data:   Vitals:   04/02/18 1128  BP: 130/83  Pulse: 95  SpO2: 99%  Weight: 176 lb (79.8 kg)  Height: _0  (1.626 m)   No intake or output data in the 24 hours ending 04/02/18 1202 Filed Weights   04/02/18 1128  Weight: 176 lb (79.8 kg)   Body mass index is 30.21 kg/m.  General:  Well nourished, well developed, in no acute distress HEENT: normal Lymph: no adenopathy Neck: no JVD Endocrine:  No thryomegaly Vascular: No carotid bruits; FA pulses 2+ bilaterally without bruits  Cardiac:  normal S1, S2; RRR Lungs:  clear to auscultation bilaterally, no wheezing, rhonchi or rales  Abd: soft, nontender, no hepatomegaly  Ext: no edema Musculoskeletal:  No deformities, BUE and BLE strength normal and equal Skin: warm and dry  Neuro:  CNs 2-12 intact, no focal abnormalities noted Psych:  Normal affect   Laboratory Data:  Chemistry Recent Labs  Lab 04/01/18 1617  NA 141  K 4.3  CL 104  CO2 23  GLUCOSE 91  BUN 11  CREATININE 1.08*  CALCIUM 9.3  GFRNONAA 61  GFRAA 71    No results for input(s): PROT, ALBUMIN, AST, ALT, ALKPHOS, BILITOT in the last 168 hours. Hematology Recent Labs  Lab 04/01/18 1617  WBC 7.4  RBC 4.47  HGB 13.3  HCT 39.9  MCV 89  MCH 29.8  MCHC 33.3  RDW 13.6  PLT 327   Cardiac EnzymesNo results for input(s): TROPONINI in the  last 168 hours. No results for input(s): TROPIPOC in the last 168 hours.  BNPNo results for input(s): BNP, PROBNP in the last 168 hours.  DDimer No results for input(s): DDIMER in the last 168 hours.  Radiology/Studies:  No results found.  Assessment and Plan:   1. Chest pain 2. New cardiomyopathy 3. Pulm HTN  Plan cardiac cath today to exclude obstructive CAD and assess PA pressures.     For questions or updates, please contact [facility] [place] [place] Please consult [email or web address] for contact info under Cardiology/STEMI.    Signed, Lauree Chandler, MD  04/02/2018 12:02 PM

## 2018-04-02 NOTE — Progress Notes (Addendum)
Susan Ku PA cardiology was called to update her with lower bp/ pt asymptomatic but has Addisons and was concerned, [name] will call back with further medication advice after speaking with Dr Angelena Form. No further medication changes.

## 2018-04-02 NOTE — Interval H&P Note (Signed)
History and Physical Interval Note:  04/02/2018 12:08 PM  [name]  has presented today for cardiac cath with the diagnosis of pulmonry fibrosis, chest pressure, cardiomyopathy.  The various methods of treatment have been discussed with the patient and family. After consideration of risks, benefits and other options for treatment, the patient has consented to  Procedure(s): RIGHT/LEFT HEART CATH AND CORONARY ANGIOGRAPHY (N/A) as a surgical intervention .  The patient's history has been reviewed, patient examined, no change in status, stable for surgery.  I have reviewed the patient's chart and labs.  Questions were answered to the patient's satisfaction.    Cath Lab Visit (complete for each Cath Lab visit)  Clinical Evaluation Leading to the Procedure:   ACS: No.  Non-ACS:    Anginal Classification: CCS III  Anti-ischemic medical therapy: Minimal Therapy (1 class of medications)  Non-Invasive Test Results: No non-invasive testing performed  Prior CABG: No previous CABG         Lauree Chandler

## 2018-04-03 ENCOUNTER — Encounter: Payer: Self-pay | Admitting: Cardiovascular Disease

## 2018-04-04 ENCOUNTER — Encounter (HOSPITAL_COMMUNITY): Payer: Self-pay | Admitting: Cardiovascular Disease

## 2018-04-04 MED FILL — Verapamil HCl IV Soln 2.5 MG/ML: INTRAVENOUS | Qty: 2 | Status: AC

## 2018-04-04 MED FILL — Heparin Sod (Porcine)-NaCl IV Soln 1000 Unit/500ML-0.9%: INTRAVENOUS | Qty: 1000 | Status: AC

## 2018-04-04 MED FILL — Heparin Sod (Porcine)-NaCl IV Soln 1000 Unit/500ML-0.9%: INTRAVENOUS | Qty: 1000 | Status: CN

## 2018-04-14 ENCOUNTER — Encounter: Payer: Self-pay | Admitting: Physical Therapy

## 2018-04-14 NOTE — Therapy (Unsigned)
[facility] [address] [place], [place], [address] Phone: [phone]   Fax:  [phone]  Patient Details  Name: [name] MRN: [id number] Date of Birth: Feb 02, 1971 Referring Provider:  Terese Door, MD   Encounter Date: 04/14/2018  PHYSICAL THERAPY DISCHARGE SUMMARY  Visits from Start of Care: 10  08/01/2017-11/12/2017  Current functional level related to goals / functional outcomes: Patient improved gait with PT. Patient called on 12/01/2017 to be placed on medical hold. She never called back to reschedule so PT is discharging at this time.   Remaining deficits: Unknown as patient did not return for PT.   Education / Equipment: HEP & rollator walker use Plan: Patient agrees to discharge.  Patient goals were not met. Patient is being discharged due to a change in medical status.  ?????          Genie Wenke PT, DPT 04/14/2018, 12:55 PM  [facility] [address] [place], [place], [address] Phone: [phone]   Fax:  [phone]

## 2018-04-18 ENCOUNTER — Encounter

## 2018-07-04 ENCOUNTER — Ambulatory Visit: Payer: Self-pay | Admitting: Cardiovascular Disease

## 2018-07-11 ENCOUNTER — Encounter: Payer: Self-pay | Admitting: Cardiovascular Disease

## 2018-10-25 ENCOUNTER — Ambulatory Visit: Payer: BC Managed Care – PPO | Admitting: Hematology and Oncology

## 2018-10-27 ENCOUNTER — Ambulatory Visit (INDEPENDENT_AMBULATORY_CARE_PROVIDER_SITE_OTHER): Payer: BLUE CROSS/BLUE SHIELD | Admitting: Cardiovascular Disease

## 2018-10-27 ENCOUNTER — Encounter: Payer: Self-pay | Admitting: Cardiovascular Disease

## 2018-10-27 VITALS — BP 116/64 | HR 90 | Ht 65.0 in

## 2018-10-27 DIAGNOSIS — R002 Palpitations: Secondary | ICD-10-CM | POA: Diagnosis not present

## 2018-10-27 DIAGNOSIS — I493 Ventricular premature depolarization: Secondary | ICD-10-CM | POA: Diagnosis not present

## 2018-10-27 NOTE — Patient Instructions (Signed)
Medication Instructions:  Your physician recommends that you continue on your current medications as directed. Please refer to the Current Medication list given to you today.  If you need a refill on your cardiac medications before your next appointment, please call your pharmacy.    Lab work: None Ordered   Testing/Procedures: None Ordered   Follow-Up: At [facility], you and your health needs are our priority.  As part of our continuing mission to provide you with exceptional heart care, we have created designated Provider Care Teams.  These Care Teams include your primary Cardiologist (physician) and Advanced Practice Providers (APPs -  Physician Assistants and Nurse Practitioners) who all work together to provide you with the care you need, when you need it. You will need a follow up appointment in:  6 months.  Please call our office 2 months in advance to schedule this appointment.  You may see Mertie Moores, MD or one of the following Advanced Practice Providers on your designated Care Team: Richardson Dopp, PA-C [place], [place] . Daune Perch, NP

## 2018-10-27 NOTE — Progress Notes (Signed)
Cardiology Office Note   Date:  10/27/2018   ID:  [name], DOB 12/29/1970, MRN [id number]  PCP:  Ardith Dark, PA-C  Cardiologist:   Mertie Moores, MD   Chief Complaint  Patient presents with  . Chest Pain   Problem List:  1. Palpitations/premature ventricular contractions 2. history of Hodgkin's lymphoma-status post mantle radiation 3. History of breast cancer-status post Reconstruction 4. History of cervical cancer 5. 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 2015 for regrowth of tumor.  6. History of thyroidectomy - hx of thyroid cancer  7. Raynaud's phenomenon 8. Appendectomy 9. History chest pains-normal heart catheterization, normal TEE 10. history of adrenal insufficiency  11. Hyperlipidemia   History of Present Illness:  [name] is done very well since I last saw her about a year ago. She has had some occasional palpitations that are typically controlled with metoprolol. She's not had any episodes of chest pain. She's been able to stop all of her steroids. Her adrenal glands have gradually improved and her now producing cortisol.  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 She is still working at [employer].   Feb 24, 2013:  She has done well since I last saw her . She has had her breast implants replaced ( old ones were leaking). Her costochondritis has resolved. She has not been lifting.   She has run in 6 5K races this year. She is playing tennis. She is still in [employer] school. She is doing her orthopedic rotation.   Nov. 11, 2014:  Her HR has been high. She has a head ache frequently. She started Adderall recently.  The adderall has help with her focus but she feels much worse in it. She has been on the Adderall for 2 months and the tachycardia started about 1 week ago.  She was doing cycle classes 3 times a  week. She is exercising regularly and for the past week, her HR is extremely fast. She has gone into menapause and has lots of hot flashes.   Feb. 23, 2015:  She has been having some recent episodes of tachycardia. We increased her metoprolol at the last visit. We performed an echocardiogram which revealed  Left ventricle: The cavity size was normal. Systolic function was normal. The estimated ejection fraction was in the range of 55% to 60%. Wall motion was normal; there were no regional wall motion abnormalities. Left ventricular diastolic function parameters were normal. - Aortic valve: Trivial regurgitation. - Mitral valve: Trivial regurgitation  She tried to stop her Adderall ( did not do well with that). She decreased her dose slightly and she is not having much tachycardia. She has been   She has been found to have some osteoperosis and osteopenia.   March 09, 2014:  [name] is doing ok.. No recent cardiac problems. She has been very active and is feeling great. Playing tennis on a regular basis.     Feb. 17, 2016:   [name] is a 48 y.o. female who presents for for evaluation of palpitations , hypotension. Echo yesterday  Left ventricle: The cavity size was normal. Systolic function was normal. The estimated ejection fraction was in the range of 55% to 60%. Wall motion was normal; there were no regional wall motion abnormalities. - Aortic valve: There was trivial regurgitation. - Mitral valve: Calcified annulus.  Labs were ordered yesterday but she  was not able to get them. Continues to have palpitations today She is off her Estrogen at this point.  She is back on the Toprol - skipped 2 days.  Has been taking the PRN propranolol  Not feeling well and has not been eating or drinking well. We given 1 dose of lasix   December 15, 2014:  She has continued to have palpitations  - last 20-30 seconds .  HR has remained fairly high. Has been working out  regularly .  These are going ok.    Feb 03, 2015: [name] continues to have palpitations . She wore a monitor but actually only wore is for about 1/2 of the time ( she was at [employer]) We have discussed implantable loop.  I have some concerns about doing any procedures on her .  She has only been taking the toprol 50 mg a day instead of 75.   May 17, 2015:  [name] is seen today for follow up. She has a history of hyperlipidemia and we sent her to lipid clinic. She is tolerating the Crestor very well.   Still having palpitations but learning how to manage .  Oct. 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 Has been seen at [facility] and is now at [facility]  She has sent scans to [facility]. Is on steroids to shrink tumor, has gained lots of weight.  Is hoping to be able to get a bx so that she have this tumor treated.   She is now on home O2.  O2 sats dropped to 78 while sleeping on room air.    O2 sats on 2.5 liters are good. She uses 4 lpm at night .   Is having more palpitations. Would like a refill for propranolol'   April 18, 2017:  [name] is seen today  Is still on home O2.,  Has chronic respiratory failure ( unclear etiology at this point )   Has RV failure.   Had a right heart cath.  PA pressures were only minimally elevated.  Had significant leg swelling   Has been on steroids.  Has developed pre-diabetes  Nov. 15,, 2018  In Sept 25,she woke up from a nap Ad tingling and numbness in her legs , had no strength in her legs . 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   2nd MRI of back showed a lesion between T10 -T 11.   Nothing that truly showed compression.  Was transferred to rehab unit in [place].    Has regained some motor ability I her lower abd.   Ruptured  her left breast implant using her arms in rehab  Scheduled to have her implants removed later this month .   Still on home O2,  Palpitations have been well controlled.   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 ( Bronch in Dec. 2018)    Has pulmonary HTN.  Lasix is no working for her at this point  - takes Lasix 20 mg a day   Is using a walker now - was in a wheelchair last time  Had temporary paralysis of her legs.  Improving .  Regaining her strength.    Jan. 27, 2020:  [name] is seen for follow up  Has had pressure ulders in her hands  Is on chronic steroids for her adrenal insuff.  Has had an echocardiogram at [facility] in December.  She has normal left ventricular systolic function with an ejection fraction of 55%.  She has trivial mitral regurgitation and trivial tricuspid regurgitation as well as trivial aortic insufficiency. Was no comment on pulmonary pressures.  Past Medical History:  Diagnosis Date  . Addison's disease ([employer])   . Adrenal insufficiency ([place])   . Aortic stenosis   . Aortic stenosis   . Appendicitis 12/19/09  . Breast cancer ([place])    STATUS POST BILATERAL MASTECTOMY. STATUS POST RECONSTRUCTION. SHE HAD SILICONE BREAST IMPLANTS AND THE LEFT IMPLANT IS LEAKING SLIGHTLY  . Chest pain   . CHF with right heart failure ([place]) 04/17/2017  . GERD (gastroesophageal reflux disease)   . Hodgkin lymphoma ([facility])    STATUS POST MANTLE RADIATION  . Hypertension   . Osteoporosis   . Palpitations   . Raynaud phenomenon   . SVT (supraventricular tachycardia) ([place])   . Tachycardia   . Thyroid cancer ([place])    STATUS POST SURGICAL REMOVAL-CURRENT ON THYROID REPLACEMENT    Past Surgical History:  Procedure Laterality Date  . ABDOMINAL HYSTERECTOMY    . APPENDECTOMY    . CARDIAC CATHETERIZATION  05/18/09   NORMAL CATH  . PITUITARY SURGERY    . RIGHT/LEFT HEART CATH AND CORONARY ANGIOGRAPHY N/A 04/02/2018   Procedure: RIGHT/LEFT HEART  CATH AND CORONARY ANGIOGRAPHY;  Surgeon: Burnell Blanks, MD;  Location: [facility];  Service: Cardiovascular;  Laterality: N/A;     Current Outpatient Medications  Medication Sig Dispense Refill  . albuterol (VENTOLIN HFA) 108 (90 Base) MCG/ACT inhaler Inhale 2 puffs into the lungs every 6 (six) hours as needed for wheezing or shortness of breath.    . Alum & Mag Hydroxide-Simeth (GI COCKTAIL) SUSP suspension Take 30 mLs daily as needed by mouth for indigestion (BLEEDING ULCERS). Shake well.    . beclomethasone (QVAR) 80 MCG/ACT inhaler Inhale 2 puffs into the lungs 2 (two) times daily.    Marland Kitchen BREO ELLIPTA 200-25 MCG/INH AEPB INHALE 1 INHALATION INTO THE LUNGS ONCE DAILY.  11  . clonazePAM (KLONOPIN) 1 MG tablet Take 1 mg by mouth 3 (three) times daily.    Marland Kitchen dexlansoprazole (DEXILANT) 60 MG capsule Take 60 mg by mouth daily.      . Diclofenac Potassium (CAMBIA) 50 MG PACK Take 1 packet by mouth 3 (three) times daily as needed. AS NEEDED FOR HEADACHES    . escitalopram (LEXAPRO) 20 MG tablet Take 20 mg by mouth daily.   1  . estradiol (ESTRACE) 2 MG tablet Take 2 mg by mouth daily.  11  . Ferrous Sulfate Dried (SLOW RELEASE IRON) 45 MG TBCR Take 1 tablet at bedtime by mouth.    . furosemide (LASIX) 20 MG tablet Take 20 mg by mouth 2 (two) times daily.    . hydrocortisone (CORTEF) 10 MG tablet Take 5-10 mg by mouth 2 (two) times daily. Take 10 mg in the am and 5 mg in the pm    . levothyroxine (SYNTHROID, LEVOTHROID) 112 MCG tablet Take 112 mcg by mouth daily before breakfast.    . metoprolol succinate (TOPROL-XL) 25 MG 24 hr tablet Take 37.5 mg by mouth daily.    Marland Kitchen OVER THE COUNTER MEDICATION Take 500 mg by mouth daily. Tumeric cucurmin    . rosuvastatin (CRESTOR) 10 MG  tablet Take 10 mg by mouth daily.     . sucralfate (CARAFATE) 1 g tablet Take 1 g as needed by mouth (FOR ULCERS).    Marland Kitchen topiramate (TOPAMAX) 100 MG tablet Take 150 mg by mouth at bedtime.     . traMADol (ULTRAM)  50 MG tablet Take 50 mg by mouth daily as needed (migraine).     . zolpidem (AMBIEN CR) 12.5 MG CR tablet Take 12.5 mg by mouth at bedtime.      . potassium chloride (K-DUR) 10 MEQ tablet Take 1 tablet (10 mEq total) by mouth daily. 90 tablet 3   No current facility-administered medications for this visit.     Allergies:   Cymbalta [duloxetine hcl]; Hydromorphone; Buprenorphine hcl; Compazine; Metoclopramide; Morphine and related; Na ferric gluc cplx in sucrose; Ondansetron; Promethazine hcl; and Tegaderm ag mesh [silver]    Social History:  The patient  reports that she has never smoked. She has never used smokeless tobacco. She reports current alcohol use. She reports that she does not use drugs.   Family History:  The patient's Family history is unknown by patient.    ROS:  Please see the history of present illness.   Physical Exam: Blood pressure 116/64, pulse 90, height _0  (1.651 m), SpO2 98 %.  GEN:  Well nourished, well developed in no acute distress HEENT: Normal NECK: No JVD; No carotid bruits LYMPHATICS: No lymphadenopathy CARDIAC: RRR , no murmurs, rubs, gallops RESPIRATORY:  Clear to auscultation without rales, wheezing or rhonchi  ABDOMEN: Soft, non-tender, non-distended MUSCULOSKELETAL:  No edema; No deformity  SKIN: Warm and dry NEUROLOGIC:  Alert and oriented x 3   EKG:      Recent Labs: 01/23/2018: ALT 41; TSH 0.379 04/01/2018: BUN 11; Creatinine, Ser 1.08; Hemoglobin 13.3; Platelets 327; Potassium 4.3; Sodium 141    Lipid Panel    Component Value Date/Time   CHOL 204 (H) 01/23/2018 1225   CHOL 207 (H) 12/08/2013 0823   TRIG 184 (H) 01/23/2018 1225   TRIG 96 12/08/2013 0823   HDL 60 01/23/2018 1225   HDL 57 12/08/2013 0823   CHOLHDL 3.4 01/23/2018 1225   CHOLHDL 3 05/16/2015 0933   VLDL 21.4 05/16/2015 0933   LDLCALC 107 (H) 01/23/2018 1225   LDLCALC 131 (H) 12/08/2013 0823      Wt Readings from Last 3 Encounters:  04/02/18 176 lb (79.8 kg)    04/01/18 176 lb 12.8 oz (80.2 kg)  01/23/18 178 lb (80.7 kg)      Other studies Reviewed: Additional studies/ records that were reviewed today include: . Review of the above records demonstrates:    ASSESSMENT AND PLAN:    -  Right heart failure:   Has lung fibroelastosis . R heart cath in 2018 shows PA pressure of 30   Echocardiogram from December shows stable right ventricle.   - Palpitations/premature ventricular contractions -  Palpitation seem to be fairly well controlled.  - history of Hodgkin's lymphoma-status post mantle radiation   - History of breast cancer-status post Reconstruction Has recurrent breast cancer.    4. History of cervical cancer 5. 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 2015 for regrowth of tumor.  6. History of thyroidectomy - hx of thyroid cancer ,    Check TSH, free T4, free  T3  7. Raynaud's phenomenon 8. Appendectomy 9. History chest pains-normal heart catheterization, normal TEE,,   . 10. history of adrenal insufficiency  - on  chronic cortev  11.  Palpitations:     Current medicines are reviewed at length with the patient today.  The patient does not have concerns regarding medicines.  The following changes have been made:  no change  Disposition:   FU with me in me in 6 months    Signed, Mertie Moores, MD  10/27/2018 11:01 AM    [address], [place], [place]  [address] Phone: [phone]; Fax: [phone]

## 2018-11-07 ENCOUNTER — Emergency Department (HOSPITAL_COMMUNITY): Payer: BLUE CROSS/BLUE SHIELD

## 2018-11-07 ENCOUNTER — Inpatient Hospital Stay (HOSPITAL_COMMUNITY)
Admission: EM | Admit: 2018-11-07 | Discharge: 2018-11-20 | DRG: 602 | Disposition: A | Discharge status: Home / Self Care | Payer: BLUE CROSS/BLUE SHIELD | Attending: Internal Medicine | Admitting: Internal Medicine

## 2018-11-07 ENCOUNTER — Telehealth: Payer: BLUE CROSS/BLUE SHIELD | Admitting: Family

## 2018-11-07 ENCOUNTER — Encounter (HOSPITAL_COMMUNITY): Payer: Self-pay

## 2018-11-07 ENCOUNTER — Other Ambulatory Visit: Payer: Self-pay

## 2018-11-07 DIAGNOSIS — Z9882 Breast implant status: Secondary | ICD-10-CM | POA: Diagnosis not present

## 2018-11-07 DIAGNOSIS — N200 Calculus of kidney: Secondary | ICD-10-CM

## 2018-11-07 DIAGNOSIS — R042 Hemoptysis: Secondary | ICD-10-CM | POA: Diagnosis not present

## 2018-11-07 DIAGNOSIS — G43909 Migraine, unspecified, not intractable, without status migrainosus: Secondary | ICD-10-CM

## 2018-11-07 DIAGNOSIS — T451X5A Adverse effect of antineoplastic and immunosuppressive drugs, initial encounter: Secondary | ICD-10-CM | POA: Diagnosis present

## 2018-11-07 DIAGNOSIS — I96 Gangrene, not elsewhere classified: Secondary | ICD-10-CM | POA: Diagnosis not present

## 2018-11-07 DIAGNOSIS — Z95828 Presence of other vascular implants and grafts: Secondary | ICD-10-CM | POA: Diagnosis not present

## 2018-11-07 DIAGNOSIS — J189 Pneumonia, unspecified organism: Secondary | ICD-10-CM

## 2018-11-07 DIAGNOSIS — C73 Malignant neoplasm of thyroid gland: Secondary | ICD-10-CM | POA: Diagnosis not present

## 2018-11-07 DIAGNOSIS — I110 Hypertensive heart disease with heart failure: Secondary | ICD-10-CM | POA: Diagnosis present

## 2018-11-07 DIAGNOSIS — E2740 Unspecified adrenocortical insufficiency: Secondary | ICD-10-CM | POA: Diagnosis not present

## 2018-11-07 DIAGNOSIS — I428 Other cardiomyopathies: Secondary | ICD-10-CM | POA: Diagnosis present

## 2018-11-07 DIAGNOSIS — K219 Gastro-esophageal reflux disease without esophagitis: Secondary | ICD-10-CM | POA: Diagnosis present

## 2018-11-07 DIAGNOSIS — M7989 Other specified soft tissue disorders: Secondary | ICD-10-CM | POA: Diagnosis not present

## 2018-11-07 DIAGNOSIS — I959 Hypotension, unspecified: Secondary | ICD-10-CM | POA: Diagnosis not present

## 2018-11-07 DIAGNOSIS — I890 Lymphedema, not elsewhere classified: Secondary | ICD-10-CM | POA: Diagnosis present

## 2018-11-07 DIAGNOSIS — Z79890 Hormone replacement therapy (postmenopausal): Secondary | ICD-10-CM

## 2018-11-07 DIAGNOSIS — R509 Fever, unspecified: Secondary | ICD-10-CM | POA: Diagnosis not present

## 2018-11-07 DIAGNOSIS — Y812 Prosthetic and other implants, materials and accessory general- and plastic-surgery devices associated with adverse incidents: Secondary | ICD-10-CM | POA: Diagnosis present

## 2018-11-07 DIAGNOSIS — E876 Hypokalemia: Secondary | ICD-10-CM | POA: Diagnosis not present

## 2018-11-07 DIAGNOSIS — T8543XA Leakage of breast prosthesis and implant, initial encounter: Secondary | ICD-10-CM | POA: Diagnosis present

## 2018-11-07 DIAGNOSIS — Z9049 Acquired absence of other specified parts of digestive tract: Secondary | ICD-10-CM

## 2018-11-07 DIAGNOSIS — M79609 Pain in unspecified limb: Secondary | ICD-10-CM | POA: Diagnosis not present

## 2018-11-07 DIAGNOSIS — J181 Lobar pneumonia, unspecified organism: Secondary | ICD-10-CM | POA: Diagnosis not present

## 2018-11-07 DIAGNOSIS — I351 Nonrheumatic aortic (valve) insufficiency: Secondary | ICD-10-CM | POA: Diagnosis not present

## 2018-11-07 DIAGNOSIS — R0602 Shortness of breath: Secondary | ICD-10-CM

## 2018-11-07 DIAGNOSIS — I424 Endocardial fibroelastosis: Secondary | ICD-10-CM | POA: Diagnosis not present

## 2018-11-07 DIAGNOSIS — Z8571 Personal history of Hodgkin lymphoma: Secondary | ICD-10-CM | POA: Diagnosis present

## 2018-11-07 DIAGNOSIS — D649 Anemia, unspecified: Secondary | ICD-10-CM | POA: Diagnosis present

## 2018-11-07 DIAGNOSIS — I10 Essential (primary) hypertension: Secondary | ICD-10-CM | POA: Diagnosis present

## 2018-11-07 DIAGNOSIS — Z923 Personal history of irradiation: Secondary | ICD-10-CM

## 2018-11-07 DIAGNOSIS — L98499 Non-pressure chronic ulcer of skin of other sites with unspecified severity: Secondary | ICD-10-CM | POA: Diagnosis present

## 2018-11-07 DIAGNOSIS — C8190 Hodgkin lymphoma, unspecified, unspecified site: Secondary | ICD-10-CM | POA: Diagnosis not present

## 2018-11-07 DIAGNOSIS — Z86000 Personal history of in-situ neoplasm of breast: Secondary | ICD-10-CM | POA: Diagnosis present

## 2018-11-07 DIAGNOSIS — L03011 Cellulitis of right finger: Principal | ICD-10-CM

## 2018-11-07 DIAGNOSIS — Z8572 Personal history of non-Hodgkin lymphomas: Secondary | ICD-10-CM | POA: Diagnosis not present

## 2018-11-07 DIAGNOSIS — Z8541 Personal history of malignant neoplasm of cervix uteri: Secondary | ICD-10-CM

## 2018-11-07 DIAGNOSIS — M79641 Pain in right hand: Secondary | ICD-10-CM | POA: Diagnosis present

## 2018-11-07 DIAGNOSIS — Z85850 Personal history of malignant neoplasm of thyroid: Secondary | ICD-10-CM | POA: Diagnosis present

## 2018-11-07 DIAGNOSIS — J9611 Chronic respiratory failure with hypoxia: Secondary | ICD-10-CM | POA: Diagnosis not present

## 2018-11-07 DIAGNOSIS — L988 Other specified disorders of the skin and subcutaneous tissue: Secondary | ICD-10-CM | POA: Diagnosis not present

## 2018-11-07 DIAGNOSIS — I2723 Pulmonary hypertension due to lung diseases and hypoxia: Secondary | ICD-10-CM | POA: Diagnosis not present

## 2018-11-07 DIAGNOSIS — C50919 Malignant neoplasm of unspecified site of unspecified female breast: Secondary | ICD-10-CM

## 2018-11-07 DIAGNOSIS — Z86718 Personal history of other venous thrombosis and embolism: Secondary | ICD-10-CM | POA: Diagnosis not present

## 2018-11-07 DIAGNOSIS — G8929 Other chronic pain: Secondary | ICD-10-CM | POA: Diagnosis present

## 2018-11-07 DIAGNOSIS — E663 Overweight: Secondary | ICD-10-CM | POA: Diagnosis present

## 2018-11-07 DIAGNOSIS — Z8639 Personal history of other endocrine, nutritional and metabolic disease: Secondary | ICD-10-CM | POA: Diagnosis not present

## 2018-11-07 DIAGNOSIS — Z9981 Dependence on supplemental oxygen: Secondary | ICD-10-CM

## 2018-11-07 DIAGNOSIS — R609 Edema, unspecified: Secondary | ICD-10-CM | POA: Diagnosis present

## 2018-11-07 DIAGNOSIS — E785 Hyperlipidemia, unspecified: Secondary | ICD-10-CM | POA: Diagnosis not present

## 2018-11-07 DIAGNOSIS — Z853 Personal history of malignant neoplasm of breast: Secondary | ICD-10-CM

## 2018-11-07 DIAGNOSIS — E2749 Other adrenocortical insufficiency: Secondary | ICD-10-CM | POA: Diagnosis present

## 2018-11-07 DIAGNOSIS — Z888 Allergy status to other drugs, medicaments and biological substances status: Secondary | ICD-10-CM

## 2018-11-07 DIAGNOSIS — Z7952 Long term (current) use of systemic steroids: Secondary | ICD-10-CM

## 2018-11-07 DIAGNOSIS — Z8679 Personal history of other diseases of the circulatory system: Secondary | ICD-10-CM | POA: Diagnosis not present

## 2018-11-07 DIAGNOSIS — I50810 Right heart failure, unspecified: Secondary | ICD-10-CM | POA: Diagnosis present

## 2018-11-07 DIAGNOSIS — Z79891 Long term (current) use of opiate analgesic: Secondary | ICD-10-CM

## 2018-11-07 DIAGNOSIS — Z6829 Body mass index (BMI) 29.0-29.9, adult: Secondary | ICD-10-CM

## 2018-11-07 DIAGNOSIS — E890 Postprocedural hypothyroidism: Secondary | ICD-10-CM | POA: Diagnosis not present

## 2018-11-07 DIAGNOSIS — Y95 Nosocomial condition: Secondary | ICD-10-CM | POA: Diagnosis not present

## 2018-11-07 DIAGNOSIS — M810 Age-related osteoporosis without current pathological fracture: Secondary | ICD-10-CM | POA: Diagnosis present

## 2018-11-07 DIAGNOSIS — F410 Panic disorder [episodic paroxysmal anxiety] without agoraphobia: Secondary | ICD-10-CM | POA: Diagnosis present

## 2018-11-07 DIAGNOSIS — Z90710 Acquired absence of both cervix and uterus: Secondary | ICD-10-CM

## 2018-11-07 DIAGNOSIS — I083 Combined rheumatic disorders of mitral, aortic and tricuspid valves: Secondary | ICD-10-CM | POA: Diagnosis not present

## 2018-11-07 DIAGNOSIS — Z9013 Acquired absence of bilateral breasts and nipples: Secondary | ICD-10-CM

## 2018-11-07 DIAGNOSIS — Z719 Counseling, unspecified: Secondary | ICD-10-CM

## 2018-11-07 DIAGNOSIS — R918 Other nonspecific abnormal finding of lung field: Secondary | ICD-10-CM | POA: Diagnosis present

## 2018-11-07 DIAGNOSIS — Z5329 Procedure and treatment not carried out because of patient's decision for other reasons: Secondary | ICD-10-CM | POA: Diagnosis not present

## 2018-11-07 DIAGNOSIS — I7300 Raynaud's syndrome without gangrene: Secondary | ICD-10-CM | POA: Diagnosis not present

## 2018-11-07 DIAGNOSIS — Z885 Allergy status to narcotic agent status: Secondary | ICD-10-CM

## 2018-11-07 DIAGNOSIS — J9621 Acute and chronic respiratory failure with hypoxia: Secondary | ICD-10-CM | POA: Diagnosis not present

## 2018-11-07 DIAGNOSIS — I50812 Chronic right heart failure: Secondary | ICD-10-CM | POA: Diagnosis not present

## 2018-11-07 DIAGNOSIS — Z7951 Long term (current) use of inhaled steroids: Secondary | ICD-10-CM

## 2018-11-07 DIAGNOSIS — Z9089 Acquired absence of other organs: Secondary | ICD-10-CM

## 2018-11-07 DIAGNOSIS — Z79899 Other long term (current) drug therapy: Secondary | ICD-10-CM

## 2018-11-07 DIAGNOSIS — I340 Nonrheumatic mitral (valve) insufficiency: Secondary | ICD-10-CM | POA: Diagnosis not present

## 2018-11-07 HISTORY — DX: Cellulitis of right finger: L03.011

## 2018-11-07 LAB — BASIC METABOLIC PANEL
Anion gap: 7 (ref 5–15)
BUN: 14 mg/dL (ref 6–20)
CO2: 21 mmol/L — ABNORMAL LOW (ref 22–32)
Calcium: 8.4 mg/dL — ABNORMAL LOW (ref 8.9–10.3)
Chloride: 112 mmol/L — ABNORMAL HIGH (ref 98–111)
Creatinine, Ser: 0.86 mg/dL (ref 0.44–1.00)
GFR calc Af Amer: >60 mL/min (ref >60)
GFR calc non Af Amer: >60 mL/min (ref >60)
Glucose, Bld: 124 mg/dL — ABNORMAL HIGH (ref 70–99)
Potassium: 3.7 mmol/L (ref 3.5–5.1)
Sodium: 140 mmol/L (ref 135–145)

## 2018-11-07 LAB — CBC WITH DIFFERENTIAL/PLATELET
Abs Immature Granulocytes: 0.02 10*3/uL (ref 0.00–0.07)
Basophils Absolute: 0 10*3/uL (ref 0.0–0.1)
Basophils Relative: 1 %
Eosinophils Absolute: 0.1 10*3/uL (ref 0.0–0.5)
Eosinophils Relative: 2 %
HCT: 39.1 % (ref 36.0–46.0)
Hemoglobin: 12.1 g/dL (ref 12.0–15.0)
Immature Granulocytes: 0 %
Lymphocytes Relative: 30 %
Lymphs Abs: 1.8 10*3/uL (ref 0.7–4.0)
MCH: 29.3 pg (ref 26.0–34.0)
MCHC: 30.9 g/dL (ref 30.0–36.0)
MCV: 94.7 fL (ref 80.0–100.0)
Monocytes Absolute: 0.6 10*3/uL (ref 0.1–1.0)
Monocytes Relative: 10 %
Neutro Abs: 3.6 10*3/uL (ref 1.7–7.7)
Neutrophils Relative %: 57 %
Platelets: 263 10*3/uL (ref 150–400)
RBC: 4.13 MIL/uL (ref 3.87–5.11)
RDW: 13.2 % (ref 11.5–15.5)
WBC: 6.1 10*3/uL (ref 4.0–10.5)
nRBC: 0 % (ref 0.0–0.2)

## 2018-11-07 LAB — LACTIC ACID, PLASMA: Lactic Acid, Venous: 1.3 mmol/L (ref 0.5–1.9)

## 2018-11-07 MED ORDER — FENTANYL CITRATE (PF) 100 MCG/2ML IJ SOLN
50.0000 ug | Freq: Once | INTRAMUSCULAR | Status: AC
  Administered 2018-11-07: 50 ug via INTRAVENOUS
  Filled 2018-11-07: qty 2

## 2018-11-07 MED ORDER — DIPHENHYDRAMINE HCL 50 MG/ML IJ SOLN
25.0000 mg | Freq: Once | INTRAMUSCULAR | Status: AC
  Administered 2018-11-07: 25 mg via INTRAVENOUS
  Filled 2018-11-07: qty 1

## 2018-11-07 MED ORDER — VANCOMYCIN HCL IN DEXTROSE 750-5 MG/150ML-% IV SOLN
750.0000 mg | Freq: Once | INTRAVENOUS | Status: AC
  Administered 2018-11-07: 750 mg via INTRAVENOUS
  Filled 2018-11-07: qty 150

## 2018-11-07 MED ORDER — METOPROLOL SUCCINATE ER 25 MG PO TB24
37.5000 mg | ORAL_TABLET | Freq: Every day | ORAL | Status: DC
  Administered 2018-11-08 – 2018-11-20 (×10): 37.5 mg via ORAL
  Filled 2018-11-07 (×13): qty 2

## 2018-11-07 MED ORDER — SUCRALFATE 1 G PO TABS: 1.0000 g | ORAL_TABLET | ORAL | Status: DC | PRN

## 2018-11-07 MED ORDER — DIPHENHYDRAMINE HCL 50 MG/ML IJ SOLN
12.5000 mg | Freq: Once | INTRAMUSCULAR | Status: AC
  Administered 2018-11-07: 12.5 mg via INTRAVENOUS
  Filled 2018-11-07: qty 1

## 2018-11-07 MED ORDER — VANCOMYCIN HCL IN DEXTROSE 1-5 GM/200ML-% IV SOLN
1000.0000 mg | Freq: Once | INTRAVENOUS | Status: AC
  Administered 2018-11-07: 1000 mg via INTRAVENOUS
  Filled 2018-11-07: qty 200

## 2018-11-07 MED ORDER — TRAMADOL HCL 50 MG PO TABS
50.0000 mg | ORAL_TABLET | Freq: Every day | ORAL | Status: DC | PRN
  Administered 2018-11-09: 50 mg via ORAL
  Filled 2018-11-07 (×2): qty 1

## 2018-11-07 MED ORDER — ESCITALOPRAM OXALATE 10 MG PO TABS
20.0000 mg | ORAL_TABLET | Freq: Every day | ORAL | Status: DC
  Administered 2018-11-08 – 2018-11-19 (×12): 20 mg via ORAL
  Filled 2018-11-07 (×14): qty 2

## 2018-11-07 MED ORDER — HYDROCORTISONE NA SUCCINATE PF 100 MG IJ SOLR
100.0000 mg | Freq: Once | INTRAMUSCULAR | Status: AC
  Administered 2018-11-07: 100 mg via INTRAVENOUS
  Filled 2018-11-07: qty 2

## 2018-11-07 MED ORDER — ENOXAPARIN SODIUM 40 MG/0.4ML ~~LOC~~ SOLN
40.0000 mg | SUBCUTANEOUS | Status: DC
  Administered 2018-11-08 – 2018-11-12 (×5): 40 mg via SUBCUTANEOUS
  Filled 2018-11-07 (×6): qty 0.4

## 2018-11-07 MED ORDER — SODIUM CHLORIDE 0.9% FLUSH
3.0000 mL | Freq: Two times a day (BID) | INTRAVENOUS | Status: DC
  Administered 2018-11-08 – 2018-11-10 (×4): 3 mL via INTRAVENOUS
  Administered 2018-11-10: 10 mL via INTRAVENOUS
  Administered 2018-11-11 – 2018-11-14 (×3): 3 mL via INTRAVENOUS

## 2018-11-07 MED ORDER — SODIUM CHLORIDE 0.9 % IV SOLN
250.0000 mL | INTRAVENOUS | Status: DC | PRN
  Administered 2018-11-10: 1000 mL via INTRAVENOUS

## 2018-11-07 MED ORDER — FUROSEMIDE 20 MG PO TABS
20.0000 mg | ORAL_TABLET | Freq: Two times a day (BID) | ORAL | Status: DC
  Administered 2018-11-08: 20 mg via ORAL
  Filled 2018-11-07: qty 1

## 2018-11-07 MED ORDER — SODIUM CHLORIDE 0.9% FLUSH: 10.0000 mL | INTRAVENOUS | Status: DC | PRN

## 2018-11-07 MED ORDER — HYDROCORTISONE NA SUCCINATE PF 100 MG IJ SOLR
100.0000 mg | Freq: Every day | INTRAMUSCULAR | Status: DC
  Administered 2018-11-08 – 2018-11-10 (×3): 100 mg via INTRAVENOUS
  Filled 2018-11-07 (×3): qty 2

## 2018-11-07 MED ORDER — ESTRADIOL 2 MG PO TABS
2.0000 mg | ORAL_TABLET | Freq: Every day | ORAL | Status: DC
  Filled 2018-11-07: qty 1

## 2018-11-07 MED ORDER — BUDESONIDE 0.5 MG/2ML IN SUSP
0.5000 mg | Freq: Two times a day (BID) | RESPIRATORY_TRACT | Status: DC
Start: 2018-11-07 — End: 2018-11-20
  Administered 2018-11-08 – 2018-11-19 (×16): 0.5 mg via RESPIRATORY_TRACT
  Filled 2018-11-07 (×24): qty 2

## 2018-11-07 MED ORDER — SODIUM CHLORIDE 0.9% FLUSH: 3.0000 mL | INTRAVENOUS | Status: DC | PRN

## 2018-11-07 MED ORDER — FLUTICASONE FUROATE-VILANTEROL 200-25 MCG/INH IN AEPB
1.0000 | INHALATION_SPRAY | Freq: Every day | RESPIRATORY_TRACT | Status: DC
  Administered 2018-11-08 – 2018-11-18 (×9): 1 via RESPIRATORY_TRACT
  Filled 2018-11-07: qty 28

## 2018-11-07 MED ORDER — VANCOMYCIN HCL IN DEXTROSE 1-5 GM/200ML-% IV SOLN
1000.0000 mg | Freq: Two times a day (BID) | INTRAVENOUS | Status: DC
  Administered 2018-11-08 – 2018-11-09 (×3): 1000 mg via INTRAVENOUS
  Filled 2018-11-07 (×3): qty 200

## 2018-11-07 NOTE — ED Notes (Signed)
XR at bedside

## 2018-11-07 NOTE — ED Notes (Signed)
Bed: WA17 Expected date:  Expected time:  Means of arrival:  Comments: Triage 1

## 2018-11-07 NOTE — Progress Notes (Signed)
Greater than 5 minutes, yet less than 10 minutes of time have been spent researching, coordinating, and implementing care for this patient today.  Thank you for the details you included in the comment boxes. Those details are very helpful in determining the best course of treatment for you and help [place] to provide the best care.  You need to be seen face-to-face ASAP today! Your condition is serious and may result in a systemic infection, which could lead to death if not handled properly. Please go today immediately to get lab workup, possible wound exudate culture, a physical exam, and proper treatment beyond the scope of the e-visit program.  Based on what you shared with me it looks like you have a serious condition that should be evaluated in a face to face office visit.  NOTE: If you entered your credit card information for this eVisit, you will not be charged. You may see a "hold" on your card for the $30 but that hold will drop off and you will not have a charge processed.  If you are having a true medical emergency please call 911.  If you need an urgent face to face visit,  has four urgent care centers for your convenience.  If you need care fast and have a high deductible or no insurance consider:   [email or web address] to reserve your spot online an avoid wait times  [facility] [address], [address] [place], [place] [address] 8 am to 8 pm Monday-Friday 10 am to 4 pm Saturday-Sunday *Across the street from [employer]  [place], [address] 8 am to 5 pm Monday-Friday * In the [facility] on the [facility]   The following sites will take your  insurance:  . [facility] [place] a Provider at this Location  [address] [place], [place] [address] . 10 am to 8 pm Monday-Friday . 12 pm to 8 pm Saturday-Sunday   . [facility] at [place] a Provider at this Location  [address], [place], [place] [address] . 8 am to 8 pm Monday-Friday . 9 am to 6 pm Saturday . 11 am to 6 pm Sunday   . [facility] at [place] Get Driving Directions  [address] [place], [place] [address] . 8 am to 8 pm Monday-Friday . 8 am to 4 pm Saturday-Sunday   Your e-visit answers were reviewed by a board certified advanced clinical practitioner to complete your personal care plan.  Thank you for using e-Visits.

## 2018-11-07 NOTE — H&P (Signed)
History and Physical    [name] IHK:[id number] DOB: 23-Mar-1971 DOA: 11/07/2018  PCP: Su Grand, MD  Patient coming from: home   Chief Complaint: right third finger skin changes and bilateral hand swelling  HPI: [name] is a 48 y.o. female with an extensive past medical history including hypothyroid (s/p thyroidectomy for thyroid cancer), osteoporosis, hypertension, hodgkin's lymphoma age 13 now in remission, breast cancer with multiple recurrences s/p bilateral mastectomy now in remission, pituitary adenoma s/p resection, chronic adrenal insufficiency, nephrolithiasis, cervical cancer s/p LEEP procedure, fibroelastosis of lung with pulmonary hypertension, chronic hypoxic respiratory failure on home oxygen, who presents with above.  She reports she has had several months of bilateral upper extremity swelling greatest in the hands. This has been complicated by the spontaneous occurrence of blisters that typically heal on their own. A few weeks ago she developed redness surrounding one of the burst blisters and was placed on antibiotics (keflex transitioned to clindamycin), and the problem resolved. Now about a week ago several small blisters appeared on the dorsal surface of her right 3rd finger and have progressed to dark ulcers with surrounding redness. She denies fever. No current discharge. She says that finger has become somewhat numb and she can not flex it as well as her other fingers.   The swelling has been evaluated with venous u/s negative for DVT (but did find superficial thrombosis) and CTA of chest (negative for PE or other vascular abnormalities)  Majority of pt's specialist providers are at [facility] (oncology, neurology, GI, and pulmonology). Sees a [facility] cardiologist and [place] PCP.  ED Course: hand surgery consult (gramick), vancomycin, x-ray, labs  Review of Systems: As per HPI otherwise 10 point review of systems negative.    Past Medical History:  Diagnosis  Date  . Addison's disease ([place])   . Adrenal insufficiency ([place])   . Aortic stenosis   . Aortic stenosis   . Appendicitis 12/19/09  . Breast cancer ([place])    STATUS POST BILATERAL MASTECTOMY. STATUS POST RECONSTRUCTION. SHE HAD SILICONE BREAST IMPLANTS AND THE LEFT IMPLANT IS LEAKING SLIGHTLY  . Chest pain   . CHF with right heart failure ([place]) 04/17/2017  . GERD (gastroesophageal reflux disease)   . Hodgkin lymphoma ([facility])    STATUS POST MANTLE RADIATION  . Hypertension   . Osteoporosis   . Palpitations   . Raynaud phenomenon   . SVT (supraventricular tachycardia) ([place])   . Tachycardia   . Thyroid cancer ([place])    STATUS POST SURGICAL REMOVAL-CURRENT ON THYROID REPLACEMENT    Past Surgical History:  Procedure Laterality Date  . ABDOMINAL HYSTERECTOMY    . APPENDECTOMY    . CARDIAC CATHETERIZATION  05/18/09   NORMAL CATH  . PITUITARY SURGERY    . RIGHT/LEFT HEART CATH AND CORONARY ANGIOGRAPHY N/A 04/02/2018   Procedure: RIGHT/LEFT HEART CATH AND CORONARY ANGIOGRAPHY;  Surgeon: Burnell Blanks, MD;  Location: [facility];  Service: Cardiovascular;  Laterality: N/A;     reports that she has never smoked. She has never used smokeless tobacco. She reports current alcohol use. She reports that she does not use drugs.  Allergies  Allergen Reactions  . Cymbalta [Duloxetine Hcl] Anxiety  . Hydromorphone Other (See Comments)    BP drop and heart rate drops  . Buprenorphine Hcl Hives  . Compazine     unknown  . Metoclopramide Other (See Comments)    contractures  . Morphine And Related Hives  . Na Ferric Gluc Cplx  In Sucrose Other (See Comments)    unknown  . Ondansetron Rash    [place]  . Promethazine Hcl Hives  . Tegaderm Ag Mesh [Silver] Rash    Old formulation only, is able to tolerate new formulation    Family History  Family history unknown: Yes    Prior to Admission medications   Medication Sig Start Date End Date Taking? Authorizing Provider  albuterol  (VENTOLIN HFA) 108 (90 Base) MCG/ACT inhaler Inhale 2 puffs into the lungs every 6 (six) hours as needed for wheezing or shortness of breath.   Yes [provider]  Alum & Mag Hydroxide-Simeth (GI COCKTAIL) SUSP suspension Take 30 mLs daily as needed by mouth for indigestion (BLEEDING ULCERS). Shake well.   Yes [provider]  beclomethasone (QVAR) 80 MCG/ACT inhaler Inhale 2 puffs into the lungs 2 (two) times daily.   Yes [provider]  BREO ELLIPTA 200-25 MCG/INH AEPB INHALE 1 INHALATION INTO THE LUNGS ONCE DAILY. 02/10/16  Yes [provider]  clonazePAM (KLONOPIN) 1 MG tablet Take 1 mg by mouth 3 (three) times daily.   Yes [provider]  dexlansoprazole (DEXILANT) 60 MG capsule Take 60 mg by mouth daily.     Yes [provider]  Diclofenac Potassium (CAMBIA) 50 MG PACK Take 1 packet by mouth 3 (three) times daily as needed. AS NEEDED FOR HEADACHES 10/10/15  Yes [provider]  diphenhydrAMINE (BENADRYL) 50 MG/ML injection Inject 25 mg into the vein every 6 (six) hours as needed (nausea).   Yes [provider]  escitalopram (LEXAPRO) 20 MG tablet Take 20 mg by mouth daily.  04/24/16  Yes [provider]  estradiol (ESTRACE) 2 MG tablet Take 2 mg by mouth daily. 02/01/16  Yes [provider]  Ferrous Sulfate Dried (SLOW RELEASE IRON) 45 MG TBCR Take 1 tablet at bedtime by mouth.   Yes [provider]  furosemide (LASIX) 20 MG tablet Take 20 mg by mouth 2 (two) times daily.   Yes [provider]  hydrocortisone (CORTEF) 10 MG tablet Take 5-10 mg by mouth 2 (two) times daily. Take 10 mg in the am and 5 mg in the pm   Yes [provider]  levothyroxine (SYNTHROID, LEVOTHROID) 112 MCG tablet Take 112 mcg by mouth daily before breakfast.   Yes [provider]  metoprolol succinate (TOPROL-XL) 25 MG 24 hr tablet Take 37.5 mg by mouth daily.   Yes [provider]  OVER THE  COUNTER MEDICATION Take 500 mg by mouth daily. Tumeric cucurmin   Yes [provider]  potassium citrate (UROCIT-K) 10 MEQ (1080 MG) SR tablet Take 10 mEq by mouth daily. Break pill in half to minimize discomfort/difficulty swallowing. 08/23/18  Yes [provider]  rosuvastatin (CRESTOR) 10 MG tablet Take 10 mg by mouth daily.  02/28/18  Yes [provider]  sucralfate (CARAFATE) 1 g tablet Take 1 g as needed by mouth (FOR ULCERS).   Yes [provider]  topiramate (TOPAMAX) 100 MG tablet Take 150 mg by mouth at bedtime.    Yes [provider]  traMADol (ULTRAM) 50 MG tablet Take 50 mg by mouth daily as needed (migraine).  06/28/16  Yes [provider]  zolpidem (AMBIEN CR) 12.5 MG CR tablet Take 12.5 mg by mouth at bedtime.     Yes [provider]  potassium chloride (K-DUR) 10 MEQ tablet Take 1 tablet (10 mEq total) by mouth daily. 01/23/18 04/23/18  Nahser, Arnette Norris  J, MD    Physical Exam: Vitals:   11/07/18 1849 11/07/18 1924 11/07/18 2143 11/07/18 2249  BP: 118/81 130/80 125/80 127/83  Pulse: 80 81 80 87  Resp: (!) _0 Temp:      TempSrc:      SpO2: 100% 100% 99% 100%  Weight:      Height:        Constitutional: No acute distress Head: Atraumatic Eyes: Conjunctiva clear ENM: Moist mucous membranes. Normal dentition.  Neck: Supple Respiratory: Clear to auscultation bilaterally, no wheezing/rales/rhonchi. Normal respiratory effort. No accessory muscle use. . Cardiovascular: Regular rate and rhythm. No murmurs/rubs/gallops. Abdomen: Non-tender, non-distended. No masses. No rebound or guarding. Positive bowel sounds. Musculoskeletal: No joint deformity upper and lower extremities. Normal ROM, no contractures. Normal muscle tone.  Skin: scattered ulcers dorsum of right 3rd finger that are dark in color with surrounding erythema and swelling Extremities: pitting edema of distal upper extremities bilaterally. Palpable  peripheral pulses. Neurologic: Alert, moving all 4 extremities. Decreased sensation distal right 3rd finger Psychiatric: Normal insight and judgement.  Access: right subclavian port a cath   Labs on Admission: I have personally reviewed following labs and imaging studies  CBC: Recent Labs  Lab 11/07/18 1806  WBC 6.1  NEUTROABS 3.6  HGB 12.1  HCT 39.1  MCV 94.7  PLT 968   Basic Metabolic Panel: Recent Labs  Lab 11/07/18 1806  NA 140  K 3.7  CL 112*  CO2 21*  GLUCOSE 124*  BUN 14  CREATININE 0.86  CALCIUM 8.4*   GFR: Estimated Creatinine Clearance: 84.5 mL/min (by C-G formula based on SCr of 0.86 mg/dL). Liver Function Tests: No results for input(s): AST, ALT, ALKPHOS, BILITOT, PROT, ALBUMIN in the last 168 hours. No results for input(s): LIPASE, AMYLASE in the last 168 hours. No results for input(s): AMMONIA in the last 168 hours. Coagulation Profile: No results for input(s): INR, PROTIME in the last 168 hours. Cardiac Enzymes: No results for input(s): CKTOTAL, CKMB, CKMBINDEX, TROPONINI in the last 168 hours. BNP (last 3 results) No results for input(s): PROBNP in the last 8760 hours. HbA1C: No results for input(s): HGBA1C in the last 72 hours. CBG: No results for input(s): GLUCAP in the last 168 hours. Lipid Profile: No results for input(s): CHOL, HDL, LDLCALC, TRIG, CHOLHDL, LDLDIRECT in the last 72 hours. Thyroid Function Tests: No results for input(s): TSH, T4TOTAL, FREET4, T3FREE, THYROIDAB in the last 72 hours. Anemia Panel: No results for input(s): VITAMINB12, FOLATE, FERRITIN, TIBC, IRON, RETICCTPCT in the last 72 hours. Urine analysis:    Component Value Date/Time   COLORURINE YELLOW 03/05/2016 1741   APPEARANCEUR CLOUDY (A) 03/05/2016 1741   LABSPEC 1.008 03/05/2016 1741   PHURINE 6.0 03/05/2016 1741   GLUCOSEU NEGATIVE 03/05/2016 1741   HGBUR NEGATIVE 03/05/2016 1741   BILIRUBINUR NEGATIVE 03/05/2016 1741   KETONESUR NEGATIVE 03/05/2016 1741    PROTEINUR NEGATIVE 03/05/2016 1741   UROBILINOGEN 1.0 06/16/2015 1045   NITRITE NEGATIVE 03/05/2016 1741   LEUKOCYTESUR NEGATIVE 03/05/2016 1741    Radiological Exams on Admission: Dg Hand Complete Right  Result Date: 11/07/2018 CLINICAL DATA:  48 year old female with right hand pain and swelling. No known injury. EXAM: RIGHT HAND - COMPLETE 3+ VIEW COMPARISON:  None. FINDINGS: Bone mineralization is within normal limits. There is no evidence of fracture or dislocation. There is no age advanced arthropathy or other focal bone abnormality. Generalized soft tissue swelling with evidence of subcutaneous edema along the dorsal metacarpals. No soft  tissue gas. No radiopaque foreign body identified. IMPRESSION: Soft tissue swelling with no acute osseous abnormality identified. Electronically Signed   By: Genevie Ann M.D.   On: 11/07/2018 18:35     Assessment/Plan Principal Problem:   Cellulitis of right finger Active Problems:   Hodgkin lymphoma (HCC)   Breast cancer (HCC)   Thyroid cancer (H[facility])   Adrenal insufficiency ([facility])   Hypertension   Nephrolithiasis   Oxygen dependent   Migraine   # Cellulitis left finger - no bony abnormality seen on x-ray. Hand surgery consult, plan abx and eval in am (thus transferred to [facility]). Likely sequela of now chronic edema of upper extremities - continue vancomycin - appreciate hand surgery consults  # Bilateral upper extremity edema - w/u negative for DVT in arms and chest. My best guess is this is lymphedema 2/2 surgery (has had double mastectomy) and history radiation therapy, though not clear why presenting at this relatively late date.  - has been referred to rheumatology by outpt to eval possible rheumatologic causes - f/u tsh - will likely require further outpt w/u  # Hypothyroid - tsh as above; continue synthroid (pt desires to use her own synthroid as says needs name brand)  # Chronic hypoxia dx of fibroelastosis per pt, but 03/14/18 pulm  note says no. Has had extensive evaluation, pulmonary symptoms thought to be multifactorial, with asthma, gerd, radiation injury - cont home o2 - cont home qvar, pulmicort, breo  # Adrenal insufficiency - hold home cortef 10 mg qd, start 100 mg daily stress dosing (pt says this is her stress dose and requests that dose)  # Anxiety - cont home clonazepam, escitalopram  # GERD - desires to take home dexlansoprazole  # postmenopausal vasomotor symptoms - cont home estradiol  # HTN - here bp wnl - cont home furosemide, metoprolol  # chronic pain - home tramadol prn  # chronic migraine - home topamax     DVT prophylaxis: lovenox Code Status: full  Family Communication: husband richard [phone]  Disposition Plan: tbd  Consults called: hand surgery  Admission status: med/surg    Desma Maxim MD [facility] Pager [phone]  If 7PM-7AM, please contact night-coverage [email or web address] Password [facility]  11/07/2018, 11:53 PM

## 2018-11-07 NOTE — ED Notes (Addendum)
Port deaccessed and reaccessed a second time per pt request.  MD made aware and agrees to pt request. 2nd accessing of port using aseptic technique.  Port flushed well, good blood return. No complaints from pt at this time.

## 2018-11-07 NOTE — ED Triage Notes (Addendum)
Pt has been having edema in bilateral hands for 3-4 months. Pt is a CHF pt from [facility] treatment. Pt states blisters from the edema.  Pt states that she is numb in her right middle finger. Area is reddened as well.

## 2018-11-07 NOTE — ED Notes (Signed)
Patient is currently complaining of migraine and does not think she will be able to keep the tramadol down; provider made aware.

## 2018-11-07 NOTE — ED Notes (Addendum)
Pt arrived with port previously accessed.  Old port was deaccessed, then reaccessed with new kit using aseptic technique. Port flushed well, good blood return.  Pt with c/o n/v after accessing, also c/o burning sensation at needle insertion site.  MD made aware.

## 2018-11-07 NOTE — ED Provider Notes (Addendum)
[facility] DEPT Provider Note   CSN: [id number] Arrival date & time: 11/07/18  1620     History   Chief Complaint Chief Complaint  Patient presents with  . Edema  . Cellulitis    HPI [name] is a 48 y.o. female with history of Addison's disease, breast cancer, Hodgkin's lymphoma, CHF, hypertension, Raunaud's disease, thyroid cancer presenting for evaluation of acute onset, progressively worsening pain and swelling of the right hand for 7 days.  Reports that on January 31 she developed blisters to the dorsum of the right third digit which "burst "that same day.  Since then she has had some skin changes including worsening swelling and erythema surrounding the initial wound.  Yesterday she developed numbness of the digit and now has pain to the MCP joint.  Pain worsens with palpation.  She notes ongoing edema of the bilateral hands which has been present for several months.  Reports that her oncologist has been aware of this but she is unsure why the swelling is present.  She states that she has been ruled out for the thoracic outlet syndrome.  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  Due to history of upper extremity thrombus, she declines any peripheral IVs and this is why she has a port in place.  She notes subjective fevers and chills but reports that this is not unusual for her due to her neutropenia.  Denies chest pain, shortness of breath, abdominal pain.  Notes vomiting which is not unusual for her and is managed with IV Benadryl through her port.  She is on 3 to 4 L nasal cannula at all times at home.  The history is provided by the patient.    Past Medical History:  Diagnosis Date  . Addison's disease ([place])   . Adrenal insufficiency ([place])   . Aortic stenosis   . Aortic stenosis   . Appendicitis 12/19/09   . Breast cancer ([place])    STATUS POST BILATERAL MASTECTOMY. STATUS POST RECONSTRUCTION. SHE HAD SILICONE BREAST IMPLANTS AND THE LEFT IMPLANT IS LEAKING SLIGHTLY  . Chest pain   . CHF with right heart failure ([place]) 04/17/2017  . GERD (gastroesophageal reflux disease)   . Hodgkin lymphoma ([facility])    STATUS POST MANTLE RADIATION  . Hypertension   . Osteoporosis   . Palpitations   . Raynaud phenomenon   . SVT (supraventricular tachycardia) ([place])   . Tachycardia   . Thyroid cancer ([place])    STATUS POST SURGICAL REMOVAL-CURRENT ON THYROID REPLACEMENT    Patient Active Problem List   Diagnosis Date Noted  . Cellulitis of right finger 11/07/2018  . Nephrolithiasis 11/07/2018  . Oxygen dependent 11/07/2018  . Migraine 11/07/2018  . PVC's (premature ventricular contractions) 10/27/2018  . Non-ischemic cardiomyopathy ([place])   . Right heart failure ([place]) 04/17/2017  . Edema 11/16/2014  . Dizziness 11/16/2014  . Hyperlipidemia 03/09/2014  . Hodgkin lymphoma ([place])   . Breast cancer ([place])   . Thyroid cancer ([place])   . Adrenal insufficiency ([place])   . Tachycardia   . Heart palpitations   . Chest discomfort   . Hypertension   . Raynaud phenomenon   . Appendicitis   . GERD (gastroesophageal reflux disease)     Past Surgical History:  Procedure Laterality Date  . ABDOMINAL HYSTERECTOMY    . APPENDECTOMY    .  CARDIAC CATHETERIZATION  05/18/09   NORMAL CATH  . PITUITARY SURGERY    . RIGHT/LEFT HEART CATH AND CORONARY ANGIOGRAPHY N/A 04/02/2018   Procedure: RIGHT/LEFT HEART CATH AND CORONARY ANGIOGRAPHY;  Surgeon: Burnell Blanks, MD;  Location: [facility];  Service: Cardiovascular;  Laterality: N/A;     OB History   No obstetric history on file.      Home Medications    Prior to Admission medications   Medication Sig Start Date End Date Taking? Authorizing Provider  albuterol (VENTOLIN HFA) 108 (90 Base) MCG/ACT inhaler Inhale 2 puffs into the lungs every 6 (six)  hours as needed for wheezing or shortness of breath.   Yes [provider]  Alum & Mag Hydroxide-Simeth (GI COCKTAIL) SUSP suspension Take 30 mLs daily as needed by mouth for indigestion (BLEEDING ULCERS). Shake well.   Yes [provider]  beclomethasone (QVAR) 80 MCG/ACT inhaler Inhale 2 puffs into the lungs 2 (two) times daily.   Yes [provider]  BREO ELLIPTA 200-25 MCG/INH AEPB INHALE 1 INHALATION INTO THE LUNGS ONCE DAILY. 02/10/16  Yes [provider]  clonazePAM (KLONOPIN) 1 MG tablet Take 1 mg by mouth 3 (three) times daily.   Yes [provider]  dexlansoprazole (DEXILANT) 60 MG capsule Take 60 mg by mouth daily.     Yes [provider]  Diclofenac Potassium (CAMBIA) 50 MG PACK Take 1 packet by mouth 3 (three) times daily as needed. AS NEEDED FOR HEADACHES 10/10/15  Yes [provider]  diphenhydrAMINE (BENADRYL) 50 MG/ML injection Inject 25 mg into the vein every 6 (six) hours as needed (nausea).   Yes [provider]  escitalopram (LEXAPRO) 20 MG tablet Take 20 mg by mouth daily.  04/24/16  Yes [provider]  estradiol (ESTRACE) 2 MG tablet Take 2 mg by mouth daily. 02/01/16  Yes [provider]  Ferrous Sulfate Dried (SLOW RELEASE IRON) 45 MG TBCR Take 1 tablet at bedtime by mouth.   Yes [provider]  furosemide (LASIX) 20 MG tablet Take 20 mg by mouth 2 (two) times daily.   Yes [provider]  hydrocortisone (CORTEF) 10 MG tablet Take 5-10 mg by mouth 2 (two) times daily. Take 10 mg in the am and 5 mg in the pm   Yes [provider]  levothyroxine (SYNTHROID, LEVOTHROID) 112 MCG tablet Take 112 mcg by mouth daily before breakfast.   Yes [provider]  metoprolol succinate (TOPROL-XL) 25 MG 24 hr tablet Take 37.5 mg by mouth daily.   Yes [provider]  OVER THE COUNTER MEDICATION Take 500 mg by mouth daily. Tumeric cucurmin   Yes [provider]  potassium citrate (UROCIT-K) 10 MEQ (1080 MG) SR tablet Take 10 mEq by mouth daily. Break pill in half to minimize discomfort/difficulty swallowing. 08/23/18  Yes [provider]  rosuvastatin (CRESTOR) 10 MG tablet Take 10 mg by mouth daily.  02/28/18  Yes [provider]  sucralfate (CARAFATE) 1 g tablet Take 1 g as needed by mouth (FOR ULCERS).   Yes [provider]  topiramate (TOPAMAX) 100 MG tablet Take 150 mg by mouth at bedtime.    Yes [provider]  traMADol (ULTRAM) 50 MG tablet Take 50 mg by mouth daily as needed (migraine).  06/28/16  Yes [provider]  zolpidem (AMBIEN CR) 12.5 MG CR tablet Take 12.5 mg by mouth at bedtime.     Yes [provider]  potassium chloride (K-DUR) 10 MEQ tablet Take 1 tablet (10 mEq total) by mouth daily. 01/23/18 04/23/18  Nahser, Wonda Cheng, MD    Family History Family History  Family history unknown: Yes    Social History Social History   Tobacco Use  . Smoking status: Never Smoker  . Smokeless tobacco: Never Used  Substance Use Topics  . Alcohol use: Yes    Comment: occassionally  . Drug use: No     Allergies   Cymbalta [duloxetine hcl]; Hydromorphone; Buprenorphine hcl; Compazine; Metoclopramide; Morphine and related; Na ferric gluc cplx in sucrose; Ondansetron; Promethazine hcl; and Tegaderm ag mesh [silver]   Review of Systems Review of Systems  Constitutional: Positive for chills and fever.  Respiratory: Negative for shortness of breath.   Cardiovascular: Negative for chest pain.  Gastrointestinal: Positive for nausea. Negative for abdominal pain and vomiting.  Musculoskeletal: Positive for joint swelling.  Skin: Positive for color change and wound.  Neurological: Positive for numbness.  All other systems reviewed and are negative.    Physical Exam Updated Vital Signs BP 130/80 (BP Location: Right Arm)   Pulse 81   Temp 98.3 F (36.8 C) (Oral)   Resp  19   Ht _0  (1.651 m)   Wt 80 kg   LMP  (LMP Unknown)   SpO2 100%   BMI 29.35 kg/m   Physical Exam Vitals signs and nursing note reviewed.  Constitutional:      General: She is not in acute distress.    Appearance: She is well-developed.  HENT:     Head: Normocephalic and atraumatic.  Eyes:     General:        Right eye: No discharge.        Left eye: No discharge.     Conjunctiva/sclera: Conjunctivae normal.  Neck:     Vascular: No JVD.     Trachea: No tracheal deviation.  Cardiovascular:     Rate and Rhythm: Normal rate and regular rhythm.     Pulses: Normal pulses.     Comments: Port to the right side of the chest with no surrounding erythema or induration.  2+ radial pulses bilaterally.  Trace pitting edema of the bilateral lower extremities near the ankles.  2+ pitting edema of the bilateral upper extremities at the hands. Pulmonary:     Effort: Pulmonary effort is normal.     Breath sounds: Normal breath sounds.  Abdominal:     General: There is no distension.  Musculoskeletal:     Comments: See below image.  Patient has a 5m area of necrosis? To the dorsum of the right 3rd digit middle phalanx region.  There is surrounding erythema and worsening edema proximally.  She has tenderness to palpation overlying the right third MTP joint with no crepitus.  Good capillary refill. She is able to make a fist however flexion and extension of the digit itself is limited.  She does have decreased strength with flexion and extension against resistance as compared to the right  Skin:    General: Skin is warm and dry.     Findings: No erythema.  Neurological:     Mental Status: She is alert.     Comments: Fluent speech, no facial droop, altered sensation to soft touch of the right second third and fourth digits.  Psychiatric:        Behavior: Behavior normal.        ED Treatments / Results  Labs (all labs ordered are listed, but  only abnormal results are  displayed) Labs Reviewed  BASIC METABOLIC PANEL - Abnormal; Notable for the following components:      Result Value   Chloride 112 (*)    CO2 21 (*)    Glucose, Bld 124 (*)    Calcium 8.4 (*)    All other components within normal limits  CBC WITH DIFFERENTIAL/PLATELET  LACTIC ACID, PLASMA    EKG None  Radiology Dg Hand Complete Right  Result Date: 11/07/2018 CLINICAL DATA:  48 year old female with right hand pain and swelling. No known injury. EXAM: RIGHT HAND - COMPLETE 3+ VIEW COMPARISON:  None. FINDINGS: Bone mineralization is within normal limits. There is no evidence of fracture or dislocation. There is no age advanced arthropathy or other focal bone abnormality. Generalized soft tissue swelling with evidence of subcutaneous edema along the dorsal metacarpals. No soft tissue gas. No radiopaque foreign body identified. IMPRESSION: Soft tissue swelling with no acute osseous abnormality identified. Electronically Signed   By: Genevie Ann M.D.   On: 11/07/2018 18:35    Procedures Procedures (including critical care time)  Medications Ordered in ED Medications  vancomycin (VANCOCIN) IVPB 1000 mg/200 mL premix (1,000 mg Intravenous New Bag/Given 11/07/18 2017)  diphenhydrAMINE (BENADRYL) injection 25 mg (25 mg Intravenous Given 11/07/18 1844)  fentaNYL (SUBLIMAZE) injection 50 mcg (50 mcg Intravenous Given 11/07/18 1845)  hydrocortisone sodium succinate (SOLU-CORTEF) 100 MG injection 100 mg (100 mg Intravenous Given 11/07/18 1909)  diphenhydrAMINE (BENADRYL) injection 25 mg (25 mg Intravenous Given 11/07/18 1951)     Initial Impression / Assessment and Plan / ED Course  I have reviewed the triage vital signs and the nursing notes.  Pertinent labs & imaging results that were available during my care of the patient were reviewed by me and considered in my medical decision making (see chart for details).     Patient presenting for evaluation of progressively worsening wound to the dorsum of  the right middle finger.  She is afebrile, initially mildly tachycardic, vital signs otherwise stable.  She is nontoxic in appearance.  She has decreased strength and sensation to the digit itself and somewhat to the surrounding digits.  She has some surrounding erythema and worsening swelling proximally.  She did state that the wound itself drained some purulent material a few days ago but now appears to have an area of central necrosis.  Radiographs show soft tissue swelling, no foreign body or osseous abnormality or soft tissue gas.  Lab work reviewed by me shows no leukocytosis, no anemia, no metabolic derangements.  Lactate is negative.  She does not appear to be septic at this time.  Low suspicion of septic joint.  Differential includes flexor tenosynovitis less likely given she does not exhibit Knavel's signs on exam.  No evidence of necrotizing fasciitis.  Spoke with Dr. Amedeo Plenty with hand surgery who recommends hospitalist admission, transfer to [facility], where he will see her in consultation tonight or tomorrow morning for further evaluation and recommendations.  He recommends starting the patient on IV vancomycin. Spoke with Dr. Si Raider who agrees with assessment and plan at this time.  Patient seen and evaluate by Dr. Alvino Chapel who agrees with assessment and plan at this time.  Final Clinical Impressions(s) / ED Diagnoses   Final diagnoses:  Cellulitis of right middle finger    ED Discharge Orders    None        Debroah Baller 11/07/18 2100    Davonna Belling, MD 11/08/18 0002

## 2018-11-07 NOTE — ED Notes (Signed)
[facility] contacted for transport for [employer].

## 2018-11-07 NOTE — Progress Notes (Signed)
Pharmacy Antibiotic Note  [name] is a 48 y.o. female admitted on 11/07/2018 with cellulitis.  Pharmacy has been consulted for vancomycin dosing.  Plan: vanc 1 gm plus 750 mg in ED to make total LD = 1750 mg Vancomycin 1000 mg IV Q 12 hrs. Goal AUC 400-550. Expected AUC: 535.9 SCr used: 0.86 F/u renal fxn, WBC, temp.    Height: 5' 5" (165.1 cm) Weight: 176 lb 5.9 oz (80 kg) IBW/kg (Calculated) : 57  Temp (24hrs), Avg:98.3 F (36.8 C), Min:98.3 F (36.8 C), Max:98.3 F (36.8 C)  Recent Labs  Lab 11/07/18 1806  WBC 6.1  CREATININE 0.86  LATICACIDVEN 1.3    Estimated Creatinine Clearance: 84.5 mL/min (by C-G formula based on SCr of 0.86 mg/dL).    Allergies  Allergen Reactions  . Cymbalta [Duloxetine Hcl] Anxiety  . Hydromorphone Other (See Comments)    BP drop and heart rate drops  . Buprenorphine Hcl Hives  . Compazine     unknown  . Metoclopramide Other (See Comments)    contractures  . Morphine And Related Hives  . Na Ferric Gluc Cplx In Sucrose Other (See Comments)    unknown  . Ondansetron Rash    [place]  . Promethazine Hcl Hives  . Tegaderm Ag Mesh [Silver] Rash    Old formulation only, is able to tolerate new formulation    Antimicrobials this admission: 2/7 vanc>>  Dose adjustments this admission:  Microbiology results:  Thank you for allowing pharmacy to be a part of this patient's care.  Eudelia Bunch, Pharm.D [phone] 11/07/2018 9:15 PM

## 2018-11-08 ENCOUNTER — Other Ambulatory Visit: Payer: Self-pay

## 2018-11-08 ENCOUNTER — Encounter (HOSPITAL_COMMUNITY): Payer: Self-pay

## 2018-11-08 DIAGNOSIS — G43909 Migraine, unspecified, not intractable, without status migrainosus: Secondary | ICD-10-CM

## 2018-11-08 DIAGNOSIS — E785 Hyperlipidemia, unspecified: Secondary | ICD-10-CM

## 2018-11-08 DIAGNOSIS — C73 Malignant neoplasm of thyroid gland: Secondary | ICD-10-CM

## 2018-11-08 DIAGNOSIS — Z9981 Dependence on supplemental oxygen: Secondary | ICD-10-CM

## 2018-11-08 DIAGNOSIS — R609 Edema, unspecified: Secondary | ICD-10-CM

## 2018-11-08 LAB — BASIC METABOLIC PANEL
Anion gap: 10 (ref 5–15)
BUN: 12 mg/dL (ref 6–20)
CO2: 21 mmol/L — ABNORMAL LOW (ref 22–32)
CREATININE: 0.94 mg/dL (ref 0.44–1.00)
Calcium: 9 mg/dL (ref 8.9–10.3)
Chloride: 108 mmol/L (ref 98–111)
GFR calc Af Amer: >60 mL/min (ref >60)
GFR calc non Af Amer: >60 mL/min (ref >60)
Glucose, Bld: 105 mg/dL — ABNORMAL HIGH (ref 70–99)
Potassium: 4.4 mmol/L (ref 3.5–5.1)
Sodium: 139 mmol/L (ref 135–145)

## 2018-11-08 LAB — T4, FREE: Free T4: 1.01 ng/dL (ref 0.82–1.77)

## 2018-11-08 LAB — TSH: TSH: 0.016 u[IU]/mL — ABNORMAL LOW (ref 0.350–4.500)

## 2018-11-08 MED ORDER — DIPHENHYDRAMINE HCL 50 MG/ML IJ SOLN
25.0000 mg | Freq: Three times a day (TID) | INTRAMUSCULAR | Status: DC | PRN
  Administered 2018-11-08: 25 mg via INTRAVENOUS
  Filled 2018-11-08: qty 1

## 2018-11-08 MED ORDER — CLONAZEPAM 1 MG PO TABS
1.0000 mg | ORAL_TABLET | Freq: Three times a day (TID) | ORAL | Status: DC
  Administered 2018-11-08 – 2018-11-13 (×10): 1 mg via ORAL
  Filled 2018-11-08 (×14): qty 1

## 2018-11-08 MED ORDER — FENTANYL CITRATE (PF) 100 MCG/2ML IJ SOLN
50.0000 ug | Freq: Once | INTRAMUSCULAR | Status: AC
  Administered 2018-11-08: 50 ug via INTRAVENOUS
  Filled 2018-11-08: qty 2

## 2018-11-08 MED ORDER — LEVOTHYROXINE SODIUM 112 MCG PO TABS
112.0000 ug | ORAL_TABLET | Freq: Every day | ORAL | Status: DC
  Administered 2018-11-08 – 2018-11-20 (×13): 112 ug via ORAL
  Filled 2018-11-08 (×12): qty 1

## 2018-11-08 MED ORDER — HYDROMORPHONE HCL 1 MG/ML IJ SOLN
1.0000 mg | INTRAMUSCULAR | Status: DC | PRN
  Administered 2018-11-08 – 2018-11-16 (×38): 1 mg via INTRAVENOUS
  Filled 2018-11-08 (×40): qty 1

## 2018-11-08 MED ORDER — TOPIRAMATE 25 MG PO TABS
150.0000 mg | ORAL_TABLET | Freq: Every day | ORAL | Status: DC
  Administered 2018-11-08 – 2018-11-19 (×12): 150 mg via ORAL
  Filled 2018-11-08 (×12): qty 2

## 2018-11-08 MED ORDER — ZOLPIDEM TARTRATE 5 MG PO TABS: 5.0000 mg | ORAL_TABLET | Freq: Every evening | ORAL | Status: DC | PRN

## 2018-11-08 MED ORDER — DIPHENHYDRAMINE HCL 50 MG/ML IJ SOLN
25.0000 mg | Freq: Four times a day (QID) | INTRAMUSCULAR | Status: DC | PRN
  Administered 2018-11-08 – 2018-11-10 (×8): 25 mg via INTRAVENOUS
  Filled 2018-11-08 (×8): qty 1

## 2018-11-08 MED ORDER — ESTRADIOL 2 MG PO TABS
2.0000 mg | ORAL_TABLET | Freq: Every day | ORAL | Status: DC
  Administered 2018-11-08 – 2018-11-19 (×12): 2 mg via ORAL
  Filled 2018-11-08 (×14): qty 1

## 2018-11-08 MED ORDER — NON FORMULARY: Freq: Every day | Status: DC

## 2018-11-08 MED ORDER — POTASSIUM CITRATE ER 10 MEQ (1080 MG) PO TBCR
10.0000 meq | EXTENDED_RELEASE_TABLET | Freq: Every day | ORAL | Status: DC
  Administered 2018-11-08 – 2018-11-18 (×8): 10 meq via ORAL
  Filled 2018-11-08 (×14): qty 1

## 2018-11-08 MED ORDER — FUROSEMIDE 20 MG PO TABS
20.0000 mg | ORAL_TABLET | Freq: Two times a day (BID) | ORAL | Status: DC
  Administered 2018-11-09 – 2018-11-13 (×8): 20 mg via ORAL
  Filled 2018-11-08 (×12): qty 1

## 2018-11-08 MED ORDER — NON FORMULARY: Freq: Every morning | Status: DC

## 2018-11-08 MED ORDER — PANTOPRAZOLE SODIUM 40 MG PO TBEC: 40.0000 mg | DELAYED_RELEASE_TABLET | Freq: Every morning | ORAL | Status: DC

## 2018-11-08 MED ORDER — ROSUVASTATIN CALCIUM 5 MG PO TABS
10.0000 mg | ORAL_TABLET | Freq: Every day | ORAL | Status: DC
  Administered 2018-11-09 – 2018-11-20 (×11): 10 mg via ORAL
  Filled 2018-11-08 (×14): qty 2

## 2018-11-08 NOTE — Consult Note (Signed)
Reason for Consult: Bilateral arm swelling and blistering right middle finger Referring Physician: ER staff  [name] is an 48 y.o. female.  HPI: Patient is a 48 year old female with multiple medical problems as noted in the chart.  I have reviewed the very detailed note from the hospital service.  The patient states that she has had progressive swelling since the end of 2019 which is progressively worsened.  This was worked up at [facility] where her oncologist takes care of her.  She states that she has had progressive blistering in different areas of her hands as well as swelling in the past.  She does have a history of lymph node dissection on the right arm but no extensive lymph node dissection on the left arm after mastectomies.  She has had different implants placed.  Currently the left implant has a slow leak but nothing has been planned due to the fact that she is developed pulmonary issues.  She has been on oxygen since approximately 2016 she states.  At present time she has a small area of blistering about the right middle finger as well as bilateral edema.  This area has a small central area of eschar.  There is no erythema or cellulitic change up into the arm hand or wrist.  There is a 3 to 2 mm rim of erythema around the blistered area.  These blisters seem to form and then typically decompress on their own.  She has had blisters in various stages which she pointed out to me on her right and left hands.  She is not been worked up officially for an autoimmune issue such as lupus but has a rheumatology consult pending in March through [facility].  Present time she states the finger feels numb to her specifically the third middle finger right hand  She notes no instability type symptoms or recent trauma.  Past Medical History:  Diagnosis Date  . Addison's disease ([place])   . Adrenal insufficiency ([place])   . Aortic stenosis   . Aortic stenosis   . Appendicitis 12/19/09  . Breast  cancer ([place])    STATUS POST BILATERAL MASTECTOMY. STATUS POST RECONSTRUCTION. SHE HAD SILICONE BREAST IMPLANTS AND THE LEFT IMPLANT IS LEAKING SLIGHTLY  . Chest pain   . 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 GERD (gastroesophageal reflux disease)   . Hodgkin lymphoma ([facility])    STATUS POST MANTLE RADIATION  . Hypertension   . Osteoporosis   . Palpitations   . Raynaud phenomenon   . SVT (supraventricular tachycardia) ([place])   . Tachycardia   . Thyroid cancer ([place])    STATUS POST SURGICAL REMOVAL-CURRENT ON THYROID REPLACEMENT    Past Surgical History:  Procedure Laterality Date  . ABDOMINAL HYSTERECTOMY    . APPENDECTOMY    . CARDIAC CATHETERIZATION  05/18/09   NORMAL CATH  . PITUITARY SURGERY    . RIGHT/LEFT HEART CATH AND CORONARY ANGIOGRAPHY N/A 04/02/2018   Procedure: RIGHT/LEFT HEART CATH AND CORONARY ANGIOGRAPHY;  Surgeon: Burnell Blanks, MD;  Location: [facility];  Service: Cardiovascular;  Laterality: N/A;    Family History  Family history unknown: Yes    Social History:  reports that she has never smoked. She has never used smokeless tobacco. She reports current alcohol use. She reports that she does not use drugs.  Allergies:  Allergies  Allergen Reactions  . Cymbalta [Duloxetine Hcl] Anxiety  . Hydromorphone Other (See Comments)    BP drop and  heart rate drops  . Buprenorphine Hcl Hives  . Compazine     unknown  . Metoclopramide Other (See Comments)    contractures  . Morphine And Related Hives  . Na Ferric Gluc Cplx In Sucrose Other (See Comments)    unknown  . Ondansetron Rash    Whelps  . Promethazine Hcl Hives  . Tegaderm Ag Mesh [Silver] Rash    Old formulation only, is able to tolerate new formulation    Medications: I have reviewed the patient's current medications.  Results for orders placed or performed during the hospital encounter of 11/07/18 (from the past 48 hour(s))  Basic metabolic panel     Status: Abnormal    Collection Time: 11/07/18  6:06 PM  Result Value Ref Range   Sodium 140 135 - 145 mmol/L   Potassium 3.7 3.5 - 5.1 mmol/L   Chloride 112 (H) 98 - 111 mmol/L   CO2 21 (L) 22 - 32 mmol/L   Glucose, Bld 124 (H) 70 - 99 mg/dL   BUN 14 6 - 20 mg/dL   Creatinine, Ser 0.86 0.44 - 1.00 mg/dL   Calcium 8.4 (L) 8.9 - 10.3 mg/dL   GFR calc non Af Amer >60 >60 mL/min   GFR calc Af Amer >60 >60 mL/min   Anion gap 7 5 - 15    Comment: Performed at [facility], [place] [address], [place], [place] [address]  CBC with Differential     Status: None   Collection Time: 11/07/18  6:06 PM  Result Value Ref Range   WBC 6.1 4.0 - 10.5 K/uL   RBC 4.13 3.87 - 5.11 MIL/uL   Hemoglobin 12.1 12.0 - 15.0 g/dL   HCT 39.1 36.0 - 46.0 %   MCV 94.7 80.0 - 100.0 fL   MCH 29.3 26.0 - 34.0 pg   MCHC 30.9 30.0 - 36.0 g/dL   RDW 13.2 11.5 - 15.5 %   Platelets 263 150 - 400 K/uL   nRBC 0.0 0.0 - 0.2 %   Neutrophils Relative % 57 %   Neutro Abs 3.6 1.7 - 7.7 K/uL   Lymphocytes Relative 30 %   Lymphs Abs 1.8 0.7 - 4.0 K/uL   Monocytes Relative 10 %   Monocytes Absolute 0.6 0.1 - 1.0 K/uL   Eosinophils Relative 2 %   Eosinophils Absolute 0.1 0.0 - 0.5 K/uL   Basophils Relative 1 %   Basophils Absolute 0.0 0.0 - 0.1 K/uL   Immature Granulocytes 0 %   Abs Immature Granulocytes 0.02 0.00 - 0.07 K/uL    Comment: Performed at [facility], [place] [address], [place], [place] [address]  Lactic acid, plasma     Status: None   Collection Time: 11/07/18  6:06 PM  Result Value Ref Range   Lactic Acid, Venous 1.3 0.5 - 1.9 mmol/L    Comment: Performed at [facility], [place] [address] [place], [place] [address]  Basic metabolic panel     Status: Abnormal   Collection Time: 11/08/18  3:30 AM  Result Value Ref Range   Sodium 139 135 - 145 mmol/L   Potassium 4.4 3.5 - 5.1 mmol/L   Chloride 108 98 - 111 mmol/L   CO2 21 (L) 22 - 32 mmol/L   Glucose, Bld 105 (H) 70 - 99  mg/dL   BUN 12 6 - 20 mg/dL   Creatinine, Ser 0.94 0.44 - 1.00 mg/dL   Calcium 9.0 8.9 - 10.3 mg/dL  GFR calc non Af Amer >60 >60 mL/min   GFR calc Af Amer >60 >60 mL/min   Anion gap 10 5 - 15    Comment: Performed at [facility] [address], [place], [place] [address]  TSH     Status: Abnormal   Collection Time: 11/08/18  3:55 AM  Result Value Ref Range   TSH 0.016 (L) 0.350 - 4.500 uIU/mL    Comment: Performed by a 3rd Generation assay with a functional sensitivity of <=0.01 uIU/mL. Performed at [facility], [place] [address] [place], [place] [address]   T4, free     Status: None   Collection Time: 11/08/18 11:25 AM  Result Value Ref Range   Free T4 1.01 0.82 - 1.77 ng/dL    Comment: (NOTE) Biotin ingestion may interfere with free T4 tests. If the results are inconsistent with the TSH level, previous test results, or the clinical presentation, then consider biotin interference. If needed, order repeat testing after stopping biotin. Performed at [facility], [place] [address] [place],  [address]     Dg Hand Complete Right  Result Date: 11/07/2018 CLINICAL DATA:  48 year old female with right hand pain and swelling. No known injury. EXAM: RIGHT HAND - COMPLETE 3+ VIEW COMPARISON:  None. FINDINGS: Bone mineralization is within normal limits. There is no evidence of fracture or dislocation. There is no age advanced arthropathy or other focal bone abnormality. Generalized soft tissue swelling with evidence of subcutaneous edema along the dorsal metacarpals. No soft tissue gas. No radiopaque foreign body identified. IMPRESSION: Soft tissue swelling with no acute osseous abnormality identified. Electronically Signed   By: Genevie Ann M.D.   On: 11/07/2018 18:35    ROS Blood pressure 122/76, pulse 83, temperature (!) 97.3 F (36.3 C), temperature source Oral, resp. rate 20, height _0  (1.651 m), weight 80 kg, SpO2 100 %. Physical Exam very pleasant female who  has bilateral 2+ pitting edema in the dorsal aspect of her hands.  There is no cellulitic change erythema or signs of an advancing infection in the arms.  She has a positive radial pulse.  She has no gross bony instability about the shoulder elbow wrist or forearm.  Hand examination shows limited flexion about the right red and left hands due to swelling.  This swelling is a global edema type picture.  This is very akin to what 1 would see in lower extremity peripheral edema.  She states she is on Lasix.  She has a small area about the middle finger where she had a blister form this now has an eschar over it with a 2 to 3 mm rim of redness but certainly no infectious extensor or flexor tenosynovitis appreciated on exam.  There is no palpable abscess.  I reviewed this with her at length.  She has brisk robust refill about the fingers and hand.  Lower extremity examination shows no edema and normal neurovascular exam.  There is no signs of DVT in the lower extremity.  Assessment/Plan: Bilateral upper limb swelling and edema without gross infectious abscess or advancing cellulitic change at this time.  I discussed with the patient that certainly the blisters are a cause for concern that she appears to have these periodically present and then decompressed.  Unfortunately the middle finger has had a bit more progression than usual and this is concerning to her.  I discussed with her we would share her concern but would relate to her that  I do not see any acute surgical indications at present time as there is no gross abscess.  I would recommend twice daily dressing changes and washing the area followed by a tubular gauze preparation which she can use to support the finger.  I do not see any signs of advancing neuro compromise such as acute carpal tunnel syndrome or any need for nerve decompression at this time.  Fortunately she does not have an obvious surgical lesion in regards to an infectious  abscess at present time.  Unfortunately is still a bit of a mystery as to why she is having this edema and the blistering.  One would consider etiologies such as lymphedema, venous obstruction at a higher level which has been looked into through [facility] it appears.  I discussed with patient we have seen patients with recurrent masses or scar tissue present with obstruction.  She states she was worked up for thoracic outlet syndrome/a venous obstruction event earlier at [facility].  At present time I do feel antibiotics are appropriate to make sure there is no advancing cellulitic change however I would feel comfortable transitioning her off the IV antibiotics to a p.o. antibiotic when stable and suitable for discharge.  I discussed these issues with medicine.  I will continue to watch her while she is in-house to see if there are any surgical indications or evolving infectious sequelae with regard to the right middle finger blister or other blisters for that matter.  She understands this.  It was a pleasure to see today.  [name] III 11/08/2018, 1:26 PM

## 2018-11-08 NOTE — Progress Notes (Signed)
Patient refused lab draw.  States that her PAC has been flushed and is not going to be used again tonight.  The labs she refused tonight, will get with am labs at 0400.

## 2018-11-08 NOTE — Plan of Care (Signed)
  Problem: Clinical Measurements: Goal: Respiratory complications will improve Outcome: Not Applicable Goal: Cardiovascular complication will be avoided Outcome: Progressing   Problem: Activity: Goal: Risk for activity intolerance will decrease Outcome: Progressing   Problem: Coping: Goal: Level of anxiety will decrease Outcome: Progressing   Problem: Elimination: Goal: Will not experience complications related to bowel motility Outcome: Progressing   Problem: Pain Managment: Goal: General experience of comfort will improve Outcome: Progressing

## 2018-11-08 NOTE — Progress Notes (Signed)
Pt's right hand is warm to touch, red and swollen. Pitting edema  +4; good capillary refill. VS WDL. Per pt "pain is worse." The dayshift RN states that pt's right hand looks more swollen. [facility] on-call physician is notified and no orders are given. Will continue to monitor.

## 2018-11-08 NOTE — Progress Notes (Signed)
[facility] PROGRESS NOTE  [name] MIW:[id number] DOB: 06/16/1971 DOA: 11/07/2018 PCP: Su Grand, MD  Assessment/Plan: 1. Right third finger ulceration, concerning for cellulitis.  X-ray imaging shows involvement of subcutaneous tissue with no obvious involvement with bone.  Dr. Amedeo Plenty with surgery evaluated and due to no fluctuance or other pockets of fluid does not recommend surgical intervention. Given she has no sepsis physiology and previously responded well to clindamycin I favor eventually discontinuing vancomycin and watching her on IV clindamycin to see how she does. Will continue IV vancomycin until TTE rules out no endocarditis given systolic murmur and questionable history of aortic stenosis though last TTE from 08/2018 did not show stenosis.  Blood cultures if becomes febrile  2. Subacute history of blisters developing into ulcers and bilateral hand swelling.  Ongoing for 3 months.  Is unclear if this is related to some autoimmune process (has history of rainouts phenomenon).  CCP was obtained on 08/2018 at ED vist and was unremarkable.  Pending HIV here.  Unclear family history as patient is adopted, does not have any other systemic symptoms (no joint aches, no joint pain,).  As scheduled outpatient rheumatology referral. I will also obtain ANA   3. 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  On stress dose IV hydrocortisone here and holding home steroids in the setting of acute infection.  4. 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  Status post 14-day treatment Lovenox by oncologist in 08/2018  5. Chronic hypoxic respiratory failure, stable on home 3 L nasal cannula.  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   Followed by [facility] pulmonology.  Continue home Breo Pulmicort added here.  Albuterol PRN  6. History of aortic stenosis? Documented. Systolic murmur heard on exam. Last TTE at [facility] on 08/2018 showed no stenosis will evaluate with TTE here especially in light of cellulitis though no overt concern for bacteremia.  7. Chronic nausea.  Associated with history of migraines reports allergies to most antiemetics, maintained on PRN IV Benadryl injections, outpatient port recently placed for administration  8. 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  Followed by Dr. Wendee Beavers in [facility]  9. Stage IV Hodgkin's disease, diagnosed at age 48.  Status post chemotherapy and mantle field radiotherapy  10. GERD.  Continue home Dexilant  11. HLD,stable. Home crestor  12. Postsurgical hypothyroidism. TSH suppressed but not accurate given prior pituitary resection. Follow free T4. Continue home synthroid at current dose  13. History of CHF with reduced EF.Marland Kitchen Last TTE 08/2018 showed normal systolic and diastolic function. BNP here unremarkable. Will continue home lasix  Code Status: FULL code Family Communication: husband and son at bedside  Disposition Plan: monitor on clindamycin, pain control, autoimmune labs, stress dose steroids    Consultants:  Hand Surgery  Procedures:  none  Antibiotics:  11/07/2018. IV vancomycin  HPI/Subjective:  [name] is a 48 y.o. year old female with medical history significant for Hodgkin's disease s/p mantle radiation, breast ca (path DCIS?), ovarian and cervical cancer (pathology unclear), pre-cancerous thyroid nodules s/p thyroidectomy now surgical hypothyroidism, pituitary adenoma s/p resection with secondary adrenal insufficiency, severe GERD, HTN, HL, anxiety/panic disorder, migraines, and 29-monthhistory of bilateral hand swelling with intermittent ulcerationsrecently treated with Keflex followed by Rocephin by PCP then  given 10-day course of  clindamycin in ED on 08/2018 who presented on 11/07/2018 with a few weeks of blisters to her right hand that developed into an ulcer with worsening redness, pain and swelling in same pain and was found to have cellulitis of right third finger.   She was first evaluated for blisters which developed into ulcerations of both hands by her PCP 08/2018.  At that time she was treated with one-time dose of Rocephin in the office followed by Keflex.  Due to subjective fevers at that time she was evaluated in the ED and discharged on clindamycin x 11days, left upper extremity ultrasound at that time showed associated superficial occlusive thrombus of left basilic vein with no DVT. ED evaluation at that time also included CTA negative for PE  Since then ambulatory referral hematology was placed by her primary oncologist. She has had persistent swelling in both hands without any new ulcerations until a few weeks ago with this current presentation.  A week ago noticed right middle finger blisters in middle of finger. It rsolved on its own and she covered with neosporin and dressed for a few days then it turned black with some purulent drainage. Then started noticing redness, numbness x 2 days. Pain extending in to hand during that time as well.  E visit yesterday---who advised being seen by provider face to face to rule out systemic infection. Denies fevers, chills. Chronic nausea. No vomiting, abd pain, no history of trauma/injury to hand. No other ulcers. No joint pain or aches. No dysuria, no diarrhea/constipation  This am notes decreased sensation in right third finger. Notes redness seems worse to her. NO fevers or chills.   Objective: Vitals:   11/08/18 0920 11/08/18 0931  BP:  122/76  Pulse:  83  Resp:    Temp:  (!) 97.3 F (36.3 C)  SpO2: 98% 100%    Intake/Output Summary (Last 24 hours) at 11/08/2018 1149 Last data filed at 11/08/2018 0900 Gross per 24 hour  Intake 200 ml   Output -  Net 200 ml   Filed Weights   11/07/18 1635  Weight: 80 kg    Exam:   General: Lying in bed comfortably, no distress   Cardiovascular: Regular rate and rhythm with appreciable systolic ejection murmur, no edema lower extremities, pitting edema in bilateral hands to wrist  Respiratory: Normal respiratory effort on 3 L nasal cannula, no wheezes or crackles on lung exam  Abdomen: Soft, nondistended, nontender, normal bowel sounds  Musculoskeletal: Normal range of motion  Skin   R third finger: small punctate ulceration on PIP joint of right third middle finger with surrounding erythema with slight demarcation on lateral finger, slight erythema extends to surrounding fingers, no fluctuance or appreciable tenderness to finger, decreased ROM of right fingers tenderness to palpation in base of hand, some extending swelling insurrounding fingers  Left thumb: smaller ulceration on lateral base of thumb with no surrounding erythema   Neurologic alert oriented x4, no appreciable focal deficits  Data Reviewed: Basic Metabolic Panel: Recent Labs  Lab 11/07/18 1806 11/08/18 0330  NA 140 139  K 3.7 4.4  CL 112* 108  CO2 21* 21*  GLUCOSE 124* 105*  BUN 14 12  CREATININE 0.86 0.94  CALCIUM 8.4* 9.0   Liver Function Tests: No results for input(s): AST, ALT, ALKPHOS, BILITOT, PROT, ALBUMIN in the last 168 hours. No results for input(s): LIPASE, AMYLASE in the last 168 hours. No results for input(s): AMMONIA in the last 168 hours. CBC: Recent Labs  Lab 11/07/18 1806  WBC 6.1  NEUTROABS 3.6  HGB 12.1  HCT 39.1  MCV 94.7  PLT 263   Cardiac Enzymes: No results for input(s): CKTOTAL, CKMB, CKMBINDEX, TROPONINI in the last 168 hours. BNP (last 3 results) No results for input(s): BNP in the last 8760 hours.  ProBNP (last 3 results) No results for input(s): PROBNP in the last 8760 hours.  CBG: No results for input(s): GLUCAP in the last 168 hours.  No results  found for this or any previous visit (from the past 240 hour(s)).   Studies: Dg Hand Complete Right  Result Date: 11/07/2018 CLINICAL DATA:  48 year old female with right hand pain and swelling. No known injury. EXAM: RIGHT HAND - COMPLETE 3+ VIEW COMPARISON:  None. FINDINGS: Bone mineralization is within normal limits. There is no evidence of fracture or dislocation. There is no age advanced arthropathy or other focal bone abnormality. Generalized soft tissue swelling with evidence of subcutaneous edema along the dorsal metacarpals. No soft tissue gas. No radiopaque foreign body identified. IMPRESSION: Soft tissue swelling with no acute osseous abnormality identified. Electronically Signed   By: Genevie Ann M.D.   On: 11/07/2018 18:35    Scheduled Meds: . budesonide  0.5 mg Inhalation BID  . clonazePAM  1 mg Oral TID  . enoxaparin (LOVENOX) injection  40 mg Subcutaneous Q24H  . escitalopram  20 mg Oral Daily  . estradiol  2 mg Oral Daily  . fluticasone furoate-vilanterol  1 puff Inhalation Daily  . furosemide  20 mg Oral BID  . hydrocortisone sod succinate (SOLU-CORTEF) inj  100 mg Intravenous Daily  . levothyroxine  112 mcg Oral QAC breakfast  . metoprolol succinate  37.5 mg Oral Daily  . potassium citrate  10 mEq Oral Daily  . rosuvastatin  10 mg Oral Daily  . sodium chloride flush  3 mL Intravenous Q12H  . topiramate  150 mg Oral QHS   Continuous Infusions: . sodium chloride    . vancomycin 1,000 mg (11/08/18 1059)    Principal Problem:   Cellulitis of right finger Active Problems:   Hodgkin lymphoma ([place])   Breast cancer ([place])   Thyroid cancer ([place])   Adrenal insufficiency ([place])   Hypertension   Nephrolithiasis   Oxygen dependent   Migraine      Shayla D Nettey  [facility]

## 2018-11-08 NOTE — Progress Notes (Signed)
Pharmacy Antibiotic Note  [name] is a 48 y.o. female admitted on 11/07/2018 with cellulitis.  Pharmacy has been consulted for vancomycin dosing.  Due to the patient's bump in SCr - will adjust the Vancomycin dose slightly today.   Vancomycin 750 mg IV Q 12 hrs. Goal AUC 400-550. Expected AUC: 436.5 SCr used: 0.94   Plan: - Adjust Vancomycin to 750 mg IV every 12 hours - Will continue to follow renal function, culture results, LOT, and antibiotic de-escalation plans   Height: _0  (165.1 cm) Weight: 176 lb 5.9 oz (80 kg) IBW/kg (Calculated) : 57  Temp (24hrs), Avg:97.8 F (36.6 C), Min:97.3 F (36.3 C), Max:98.3 F (36.8 C)  Recent Labs  Lab 11/07/18 1806 11/08/18 0330  WBC 6.1  --   CREATININE 0.86 0.94  LATICACIDVEN 1.3  --     Estimated Creatinine Clearance: 77.3 mL/min (by C-G formula based on SCr of 0.94 mg/dL).    Allergies  Allergen Reactions  . Cymbalta [Duloxetine Hcl] Anxiety  . Hydromorphone Other (See Comments)    BP drop and heart rate drops  . Buprenorphine Hcl Hives  . Compazine     unknown  . Metoclopramide Other (See Comments)    contractures  . Morphine And Related Hives  . Na Ferric Gluc Cplx In Sucrose Other (See Comments)    unknown  . Ondansetron Rash    Whelps  . Promethazine Hcl Hives  . Tegaderm Ag Mesh [Silver] Rash    Old formulation only, is able to tolerate new formulation    Thank you for allowing pharmacy to be a part of this patient's care.  Alycia Rossetti, PharmD, BCPS Clinical Pharmacist Clinical phone for 11/08/2018: [phone] 11/08/2018 12:44 PM   **Pharmacist phone directory can now be found on [email or web address] ([email or web address]).  Listed under [place].

## 2018-11-09 ENCOUNTER — Inpatient Hospital Stay (HOSPITAL_COMMUNITY): Payer: BLUE CROSS/BLUE SHIELD

## 2018-11-09 DIAGNOSIS — I50812 Chronic right heart failure: Secondary | ICD-10-CM

## 2018-11-09 DIAGNOSIS — I340 Nonrheumatic mitral (valve) insufficiency: Secondary | ICD-10-CM

## 2018-11-09 DIAGNOSIS — M79609 Pain in unspecified limb: Secondary | ICD-10-CM

## 2018-11-09 DIAGNOSIS — I351 Nonrheumatic aortic (valve) insufficiency: Secondary | ICD-10-CM

## 2018-11-09 DIAGNOSIS — M7989 Other specified soft tissue disorders: Secondary | ICD-10-CM

## 2018-11-09 DIAGNOSIS — I10 Essential (primary) hypertension: Secondary | ICD-10-CM

## 2018-11-09 LAB — ECHOCARDIOGRAM LIMITED
Height: 65 in
Weight: 2821.89 oz

## 2018-11-09 LAB — CBC
HEMATOCRIT: 36.3 % (ref 36.0–46.0)
Hemoglobin: 11.5 g/dL — ABNORMAL LOW (ref 12.0–15.0)
MCH: 29.2 pg (ref 26.0–34.0)
MCHC: 31.7 g/dL (ref 30.0–36.0)
MCV: 92.1 fL (ref 80.0–100.0)
Platelets: 245 10*3/uL (ref 150–400)
RBC: 3.94 MIL/uL (ref 3.87–5.11)
RDW: 13.1 % (ref 11.5–15.5)
WBC: 7.2 10*3/uL (ref 4.0–10.5)
nRBC: 0 % (ref 0.0–0.2)

## 2018-11-09 LAB — HIV ANTIBODY (ROUTINE TESTING W REFLEX): HIV Screen 4th Generation wRfx: NONREACTIVE

## 2018-11-09 MED ORDER — OXYCODONE HCL 5 MG PO TABS: 5.0000 mg | ORAL_TABLET | Freq: Four times a day (QID) | ORAL | Status: DC | PRN

## 2018-11-09 NOTE — Progress Notes (Signed)
In pts room to complete shift report. Pt states she aspirated her dexilant. VS taken, MD notified.

## 2018-11-09 NOTE — Progress Notes (Signed)
Upper extremity venous duplex has been completed.   Preliminary results in CV Proc.   Abram Sander 11/09/2018 12:41 PM

## 2018-11-09 NOTE — Plan of Care (Signed)
  Problem: Activity: Goal: Risk for activity intolerance will decrease Outcome: Progressing   Problem: Coping: Goal: Level of anxiety will decrease Outcome: Progressing   Problem: Elimination: Goal: Will not experience complications related to bowel motility Outcome: Progressing Goal: Will not experience complications related to urinary retention Outcome: Progressing   Problem: Pain Managment: Goal: General experience of comfort will improve Outcome: Progressing   Problem: Safety: Goal: Ability to remain free from injury will improve Outcome: Progressing

## 2018-11-09 NOTE — Progress Notes (Signed)
Patient ID: [name], female   DOB: [birth year], 48 y.o.   MRN: [id number]  Patient is seen and evaluated at bedside.  She has no evidence of worsening in her right middle finger.  I have gone ahead and performed a cleansing and washing of the finger followed by application of dressing.  No worsening.  She states she still feels numb in the finger.  There is no evidence of compartment syndrome or cellulitic change/lymphangitis.  I reviewed the notes.  At present time I once again would not recommend any surgical debridement but would allow this process to declare itself.  Her work-up is pending.  I discussed with her all issues.  We will continue to watch the finger for any worsening.  Jessina Marse MD

## 2018-11-09 NOTE — Plan of Care (Signed)
  Problem: Education: Goal: Knowledge of General Education information will improve Description Including pain rating scale, medication(s)/side effects and non-pharmacologic comfort measures Outcome: Progressing   Problem: Nutrition: Goal: Adequate nutrition will be maintained Outcome: Progressing   Problem: Safety: Goal: Ability to remain free from injury will improve Outcome: Progressing   Problem: Coping: Goal: Level of anxiety will decrease Reactivated

## 2018-11-09 NOTE — Progress Notes (Signed)
  Echocardiogram 2D Echocardiogram has been performed.  Jennette Dubin 11/09/2018, 1:04 PM

## 2018-11-09 NOTE — Progress Notes (Signed)
[facility] PROGRESS NOTE  [name]:[id number] DOB: [birth year] DOA: 11/07/2018 PCP: Susan Grand, MD  Assessment/Plan: 1. Right third finger ulceration, concerning for cellulitis.  X-ray imaging shows involvement of subcutaneous tissue with no obvious involvement with bone.  Dr. Amedeo Plenty with surgery following no signs of abscess/compartment syndrome or lymphangitis.  Given she has no sepsis physiology and previously responded well to clindamycin I favor  discontinuing vancomycin and watching her on IV clindamycin to see how she does. TTE negative for vegetation, no DVT in R arm, no clot in subclavian.  Blood cultures if becomes febrile  2. Subacute history of blisters developing into ulcers and bilateral hand swelling.  Ongoing for 3 months.  Is unclear if this is related to some autoimmune process (has history of rainouts phenomenon).  CCP was obtained on 08/2018 at ED vist and was unremarkable.  Pending HIV here.  Unclear family history as patient is adopted, does not have any other systemic symptoms (no joint aches, no joint pain,).  As scheduled outpatient rheumatology referral. I will also obtain ANA   3. 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  On stress dose IV hydrocortisone here and holding home steroids in the setting of acute infection.  4. Thrombosis of basilic vein (81/19/1478).  Status post 14-day treatment Lovenox by oncologist in 08/2018  5. Chronic hypoxic respiratory failure, stable on home 3 L nasal cannula.  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  Followed by [facility] pulmonology.  Continue home Breo Pulmicort added here.  Albuterol PRN  6. History of aortic stenosis. Documented in [facility]. Systolic  murmur heard on exam. Last TTE at [facility] on 08/2018 showed no stenosis. No vegetation on TTE here, does have tricupsid valve structure/calcification moderate.   7. Chronic nausea.  Associated with history of migraines reports allergies to most antiemetics, maintained on PRN IV Benadryl injections, outpatient port recently placed for administration  8. 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  Followed by Dr. Wendee Beavers in [facility]  9. Stage IV Hodgkin's disease, diagnosed at age 48.  Status post chemotherapy and mantle field radiotherapy  10. GERD.  Continue home Dexilant  11. HLD,stable. Home crestor  12. Postsurgical hypothyroidism. TSH suppressed but not accurate given prior pituitary resection. Follow free T4. Continue home synthroid at current dose  13. History of CHF with reduced EF.Marland Kitchen Last TTE 08/2018 showed normal systolic and diastolic function. BNP here unremarkable. Will continue home lasix  Code Status: FULL code Family Communication: husband and son at bedside  Disposition Plan: monitor on clindamycin, dc vancomycin, pain control, autoimmune labs, stress dose steroids    Consultants:  Hand Surgery  Procedures:  none  Antibiotics:  11/07/2018. IV vancomycin  HPI/Subjective:  [name] is a 48 y.o. year old female with medical history significant for Hodgkin's disease s/p mantle radiation, breast ca (path DCIS?), ovarian and cervical cancer (pathology unclear), pre-cancerous thyroid nodules s/p thyroidectomy now surgical hypothyroidism, pituitary adenoma s/p resection with secondary adrenal insufficiency, severe GERD, HTN, HL, anxiety/panic disorder, migraines, and 43-monthhistory of bilateral hand swelling with intermittent ulcerationsrecently treated with Keflex followed by Rocephin by PCP then given 10-day course of clindamycin in ED on 08/2018 who presented on 11/07/2018 with a few weeks of blisters to her right hand that developed  into an ulcer with worsening redness, pain and swelling in same pain and was found to have cellulitis of right third finger.   She was first evaluated for blisters which developed into ulcerations of both hands by her PCP 08/2018.  At that time she was treated with one-time dose of Rocephin in the office followed by Keflex.  Due to subjective fevers at that time she was evaluated in the ED and discharged on clindamycin x 11days, left upper extremity ultrasound at that time showed associated superficial occlusive thrombus of left basilic vein with no DVT. ED evaluation at that time also included CTA negative for PE  Since then ambulatory referral hematology was placed by her primary oncologist. She has had persistent swelling in both hands without any new ulcerations until a few weeks ago with this current presentation.  A week ago noticed right middle finger blisters in middle of finger. It rsolved on its own and she covered with neosporin and dressed for a few days then it turned black with some purulent drainage. Then started noticing redness, numbness x 2 days. Pain extending in to hand during that time as well.  E visit yesterday---who advised being seen by provider face to face to rule out systemic infection. Denies fevers, chills. Chronic nausea. No vomiting, abd pain, no history of trauma/injury to hand. No other ulcers. No joint pain or aches. No dysuria, no diarrhea/constipation  This am notes decreased sensation in right third finger. Less redness, still able to move. No fevers or chills overnight.   Objective: Vitals:   11/09/18 0814 11/09/18 1445  BP:  102/62  Pulse:  82  Resp: 18   Temp:  98.1 F (36.7 C)  SpO2:  99%    Intake/Output Summary (Last 24 hours) at 11/09/2018 1536 Last data filed at 11/09/2018 1200 Gross per 24 hour  Intake 720 ml  Output -  Net 720 ml   Filed Weights   11/07/18 1635  Weight: 80 kg    Exam:   General: Lying in bed comfortably, no distress    Cardiovascular: Regular rate and rhythm with appreciable systolic ejection murmur, no edema lower extremities, pitting edema in bilateral hands to wrist  Respiratory: Normal respiratory effort on 3 L nasal cannula, no wheezes or crackles on lung exam  Abdomen: Soft, nondistended, nontender, normal bowel sounds  Musculoskeletal: Normal range of motion  Skin   R third finger: small punctate ulceration on PIP joint of right third middle finger with surrounding erythema. No surrounding erythema in surrounding fingers or distal from lesion(improved from prior exam)  Left thumb: smaller ulceration on lateral base of thumb with no surrounding erythema   Neurologic alert oriented x4, no appreciable focal deficits  Data Reviewed: Basic Metabolic Panel: Recent Labs  Lab 11/07/18 1806 11/08/18 0330  NA 140 139  K 3.7 4.4  CL 112* 108  CO2 21* 21*  GLUCOSE 124* 105*  BUN 14 12  CREATININE 0.86 0.94  CALCIUM 8.4* 9.0   Liver Function Tests: No results for input(s): AST, ALT, ALKPHOS, BILITOT, PROT, ALBUMIN in the last 168 hours. No results for input(s): LIPASE, AMYLASE in the last 168 hours. No results for input(s): AMMONIA in the last 168 hours. CBC: Recent Labs  Lab 11/07/18 1806 11/09/18 1447  WBC 6.1 7.2  NEUTROABS 3.6  --   HGB 12.1 11.5*  HCT 39.1 36.3  MCV 94.7 92.1  PLT 263 245   Cardiac Enzymes: No results for input(s): CKTOTAL, CKMB, CKMBINDEX, TROPONINI in  the last 168 hours. BNP (last 3 results) No results for input(s): BNP in the last 8760 hours.  ProBNP (last 3 results) No results for input(s): PROBNP in the last 8760 hours.  CBG: No results for input(s): GLUCAP in the last 168 hours.  No results found for this or any previous visit (from the past 240 hour(s)).   Studies: Dg Hand Complete Right  Result Date: 11/07/2018 CLINICAL DATA:  48 year old female with right hand pain and swelling. No known injury. EXAM: RIGHT HAND - COMPLETE 3+ VIEW  COMPARISON:  None. FINDINGS: Bone mineralization is within normal limits. There is no evidence of fracture or dislocation. There is no age advanced arthropathy or other focal bone abnormality. Generalized soft tissue swelling with evidence of subcutaneous edema along the dorsal metacarpals. No soft tissue gas. No radiopaque foreign body identified. IMPRESSION: Soft tissue swelling with no acute osseous abnormality identified. Electronically Signed   By: Genevie Ann M.D.   On: 11/07/2018 18:35   Vas [place] Upper Extremity Venous Duplex  Result Date: 11/09/2018 UPPER VENOUS STUDY  Indications: Swelling, and Pain Performing Technologist: Abram Sander RVS  Examination Guidelines: A complete evaluation includes B-mode imaging, spectral Doppler, color Doppler, and power Doppler as needed of all accessible portions of each vessel. Bilateral testing is considered an integral part of a complete examination. Limited examinations for reoccurring indications may be performed as noted.  Right Findings: +----------+------------+----------+---------+-----------+-------+ RIGHT     CompressiblePropertiesPhasicitySpontaneousSummary +----------+------------+----------+---------+-----------+-------+ IJV           Full                 Yes       Yes            +----------+------------+----------+---------+-----------+-------+ Subclavian    Full                 Yes       Yes            +----------+------------+----------+---------+-----------+-------+ Axillary      Full                 Yes       Yes            +----------+------------+----------+---------+-----------+-------+ Brachial      Full                 Yes       Yes            +----------+------------+----------+---------+-----------+-------+ Radial        Full                                          +----------+------------+----------+---------+-----------+-------+ Ulnar         Full                                           +----------+------------+----------+---------+-----------+-------+ Cephalic      Full                                          +----------+------------+----------+---------+-----------+-------+ Basilic       Full                                          +----------+------------+----------+---------+-----------+-------+  Left Findings: +----------+------------+----------+---------+-----------+-------+ LEFT      CompressiblePropertiesPhasicitySpontaneousSummary +----------+------------+----------+---------+-----------+-------+ Subclavian                         Yes       Yes            +----------+------------+----------+---------+-----------+-------+  Summary:  Right: No evidence of deep vein thrombosis in the upper extremity. No evidence of superficial vein thrombosis in the upper extremity.  Left: No evidence of thrombosis in the subclavian.  *See table(s) above for measurements and observations.  Diagnosing physician: Monica Martinez MD Electronically signed by Monica Martinez MD on 11/09/2018 at 2:35:57 PM.    Final     Scheduled Meds: . budesonide  0.5 mg Inhalation BID  . clonazePAM  1 mg Oral TID  . enoxaparin (LOVENOX) injection  40 mg Subcutaneous Q24H  . escitalopram  20 mg Oral Daily  . estradiol  2 mg Oral Daily  . fluticasone furoate-vilanterol  1 puff Inhalation Daily  . furosemide  20 mg Oral BID  . hydrocortisone sod succinate (SOLU-CORTEF) inj  100 mg Intravenous Daily  . levothyroxine  112 mcg Oral QAC breakfast  . metoprolol succinate  37.5 mg Oral Daily  . potassium citrate  10 mEq Oral Daily  . rosuvastatin  10 mg Oral Daily  . sodium chloride flush  3 mL Intravenous Q12H  . topiramate  150 mg Oral QHS   Continuous Infusions: . sodium chloride    . vancomycin 1,000 mg (11/09/18 0941)    Principal Problem:   Cellulitis of right finger Active Problems:   Hodgkin lymphoma ([place])   Breast cancer ([place])   Thyroid cancer ([place])   Adrenal  insufficiency ([facility])   Hypertension   Hyperlipidemia   Edema   Right heart failure ([place])   Nephrolithiasis   Oxygen dependent   Migraine      Shayla D Nettey  [facility]

## 2018-11-10 ENCOUNTER — Inpatient Hospital Stay (HOSPITAL_COMMUNITY): Payer: BLUE CROSS/BLUE SHIELD

## 2018-11-10 DIAGNOSIS — C50919 Malignant neoplasm of unspecified site of unspecified female breast: Secondary | ICD-10-CM

## 2018-11-10 LAB — CBC
HCT: 35.7 % — ABNORMAL LOW (ref 36.0–46.0)
Hemoglobin: 10.8 g/dL — ABNORMAL LOW (ref 12.0–15.0)
MCH: 28.3 pg (ref 26.0–34.0)
MCHC: 30.3 g/dL (ref 30.0–36.0)
MCV: 93.7 fL (ref 80.0–100.0)
Platelets: 249 10*3/uL (ref 150–400)
RBC: 3.81 MIL/uL — ABNORMAL LOW (ref 3.87–5.11)
RDW: 13.2 % (ref 11.5–15.5)
WBC: 6.9 10*3/uL (ref 4.0–10.5)
nRBC: 0 % (ref 0.0–0.2)

## 2018-11-10 LAB — ANA: Anti Nuclear Antibody(ANA): NEGATIVE

## 2018-11-10 MED ORDER — DOXYCYCLINE HYCLATE 100 MG PO TABS
100.0000 mg | ORAL_TABLET | Freq: Two times a day (BID) | ORAL | Status: DC
  Administered 2018-11-10 – 2018-11-11 (×2): 100 mg via ORAL
  Filled 2018-11-10 (×2): qty 1

## 2018-11-10 MED ORDER — TRAMADOL HCL 50 MG PO TABS
50.0000 mg | ORAL_TABLET | Freq: Four times a day (QID) | ORAL | Status: DC | PRN
  Administered 2018-11-14 – 2018-11-15 (×3): 50 mg via ORAL
  Filled 2018-11-10 (×3): qty 1

## 2018-11-10 MED ORDER — HYDROCORTISONE 20 MG PO TABS: 20.0000 mg | ORAL_TABLET | Freq: Two times a day (BID) | ORAL | Status: DC

## 2018-11-10 MED ORDER — HYDROCORTISONE 10 MG PO TABS
10.0000 mg | ORAL_TABLET | Freq: Every day | ORAL | Status: DC
  Administered 2018-11-11: 10 mg via ORAL
  Filled 2018-11-10: qty 1

## 2018-11-10 MED ORDER — CYCLOBENZAPRINE HCL 10 MG PO TABS
5.0000 mg | ORAL_TABLET | Freq: Three times a day (TID) | ORAL | Status: DC | PRN
  Administered 2018-11-10 – 2018-11-16 (×15): 5 mg via ORAL
  Filled 2018-11-10 (×16): qty 1

## 2018-11-10 MED ORDER — IOHEXOL 300 MG/ML  SOLN
75.0000 mL | Freq: Once | INTRAMUSCULAR | Status: AC | PRN
  Administered 2018-11-10: 75 mL via INTRAVENOUS

## 2018-11-10 MED ORDER — HYDROCORTISONE 5 MG PO TABS
5.0000 mg | ORAL_TABLET | Freq: Every evening | ORAL | Status: DC
  Filled 2018-11-10 (×2): qty 1

## 2018-11-10 MED ORDER — DIPHENHYDRAMINE HCL 50 MG/ML IJ SOLN
50.0000 mg | Freq: Four times a day (QID) | INTRAMUSCULAR | Status: DC | PRN
  Administered 2018-11-10: 50 mg via INTRAVENOUS
  Administered 2018-11-10: 25 mg via INTRAVENOUS
  Administered 2018-11-10 – 2018-11-17 (×25): 50 mg via INTRAVENOUS
  Filled 2018-11-10 (×28): qty 1

## 2018-11-10 NOTE — Plan of Care (Signed)
  Problem: Education: Goal: Knowledge of General Education information will improve Description Including pain rating scale, medication(s)/side effects and non-pharmacologic comfort measures Outcome: Progressing   Problem: Clinical Measurements: Goal: Will remain free from infection Outcome: Progressing   Problem: Clinical Measurements: Goal: Cardiovascular complication will be avoided Outcome: Progressing

## 2018-11-10 NOTE — Progress Notes (Signed)
Patient ID: [name], female   DOB: Feb 15, 1971, 48 y.o.   MRN: [id number] Events noted and all questions discussed with the patient.  Her finger once again is evaluated.  There is no ascending erythema cellulitic change or obvious abscess of a deep nature.  I would recommend Dial soap and water dressing changes with tubular gauze to support finger.  No need for surgical intervention at this time.  From standpoint this could be something we can transition to the home environment with outpatient follow-up when medicine is ready. Her work-up is still ongoing.  We discussed this at bedside. [name] MD

## 2018-11-10 NOTE — Progress Notes (Addendum)
[facility] PROGRESS NOTE  BRENTLEY HORRELL VOJ:[id number] DOB: [birth year] DOA: 11/07/2018 PCP: Su Grand, MD  Assessment/Plan: 1. Right third finger ulceration, concerning for cellulitis, improving.  X-ray imaging shows involvement of subcutaneous tissue with no obvious involvement with bone.  Dr. Amedeo Plenty with surgery following no signs of abscess/compartment syndrome or lymphangitis.  Given she has no sepsis physiology and no obvious worsening erythema will start doxycycline and closely monitor over next 2 to 4 hours to ensure no fevers or worsening clinical status.  TTE negative for vegetation, no DVT in R arm, no clot in subclavian.  Blood cultures if becomes febrile  2. Subacute history of blisters developing into ulcers and bilateral hand swelling.  Ongoing for 3 months.  Is unclear if this is related to some autoimmune process (has history of rainouts phenomenon).  CCP was obtained on 08/2018 at ED vist and was unremarkable.  HIV and ANA negative here.  Unclear family history as patient is adopted, does not have any other systemic symptoms (no joint aches, no joint pain,).  CT chest was negative for any new metastatic lesions or any lymphadenopathy as scheduled outpatient rheumatology referral.    3. 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  Discontinue IV hydrocortisone transition to oral stress dosing 20 mg of hydrocortisone x3 days followed by reinitiation of her home regimen 10 mg in the a.m. 5 mg in p.m.  Closely monitor over next 24 hours to ensure stability  4. 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  Status post 14-day treatment Lovenox by oncologist in 08/2018  5. Chronic hypoxic respiratory failure, stable on home 3 L nasal cannula.  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  Followed by [facility] pulmonology.  Continue home Breo Pulmicort added here.  Albuterol PRN  6. History of aortic stenosis. Documented in [facility]. Systolic murmur heard on exam. Last TTE at [facility] on 08/2018 showed no stenosis. No vegetation on TTE here, does have tricupsid valve structure/calcification moderate.   7. Chronic nausea.  Associated with history of migraines reports allergies to most antiemetics, maintained on PRN IV Benadryl injections, outpatient port recently placed for administration  8. 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  Followed by Dr. Wendee Beavers in [facility]  9. Stage IV Hodgkin's disease, diagnosed at age 76.  Status post chemotherapy and mantle field radiotherapy  10. GERD.  Continue home Dexilant  11. HLD,stable. Home crestor  12. Postsurgical hypothyroidism. TSH suppressed but not accurate given prior pituitary resection. Follow free T4. Continue home synthroid at current dose  13. History of CHF with reduced EF.Marland Kitchen Last TTE 08/2018 showed normal systolic and diastolic function. BNP here unremarkable. Will continue home lasix  Code Status: FULL code Family Communication: husband  at bedside  Disposition Plan: monitor on doxycycline, and oral dosing of stress hydrocortisone, anticipate discharge next 24 hours if remains clinically stable, pain control   Consultants:  Hand Surgery  Procedures:  none  Antibiotics:  11/07/2018. IV vancomycin  HPI/Subjective:  [name] is a 48 y.o. year old female with medical history significant for Hodgkin's disease s/p mantle radiation, breast ca (path DCIS?), ovarian and cervical cancer (pathology unclear), pre-cancerous thyroid nodules s/p thyroidectomy now surgical hypothyroidism, pituitary adenoma s/p resection with secondary adrenal insufficiency, severe GERD, HTN, HL, anxiety/panic disorder, migraines,  and  91-monthhistory of bilateral hand swelling with intermittent ulcerationsrecently treated with Keflex followed by Rocephin by PCP then given 10-day course of clindamycin in ED on 08/2018 who presented on 11/07/2018 with a few weeks of blisters to her right hand that developed into an ulcer with worsening redness, pain and swelling in same pain and was found to have cellulitis of right third finger.   She was first evaluated for blisters which developed into ulcerations of both hands by her PCP 08/2018.  At that time she was treated with one-time dose of Rocephin in the office followed by Keflex.  Due to subjective fevers at that time she was evaluated in the ED and discharged on clindamycin x 11days, left upper extremity ultrasound at that time showed associated superficial occlusive thrombus of left basilic vein with no DVT. ED evaluation at that time also included CTA negative for PE  Since then ambulatory referral hematology was placed by her primary oncologist. She has had persistent swelling in both hands without any new ulcerations until a few weeks ago with this current presentation.  A week ago noticed right middle finger blisters in middle of finger. It rsolved on its own and she covered with neosporin and dressed for a few days then it turned black with some purulent drainage. Then started noticing redness, numbness x 2 days. Pain extending in to hand during that time as well.  E visit yesterday---who advised being seen by provider face to face to rule out systemic infection. Denies fevers, chills. Chronic nausea. No vomiting, abd pain, no history of trauma/injury to hand. No other ulcers. No joint pain or aches. No dysuria, no diarrhea/constipation  This a.m. no fevers or chills overnight, reports some improvement in pain.  Still some erythema but no radiation of erythema to the rest of hand or arm.  Objective: Vitals:   11/10/18 0337 11/10/18 1326  BP: (!) 103/54 99/64  Pulse: 83 84  Resp:   17  Temp: 98.1 F (36.7 C) 98.1 F (36.7 C)  SpO2: 100% 99%    Intake/Output Summary (Last 24 hours) at 11/10/2018 1722 Last data filed at 11/10/2018 1100 Gross per 24 hour  Intake 720 ml  Output -  Net 720 ml   Filed Weights   11/07/18 1635  Weight: 80 kg    Exam:   General: Lying in bed comfortably, no distress   Cardiovascular: Regular rate and rhythm with appreciable systolic ejection murmur, no edema lower extremities, pitting edema in bilateral hands to wrist  Respiratory: Normal respiratory effort on 3 L nasal cannula, no wheezes or crackles on lung exam  Abdomen: Soft, nondistended, nontender, normal bowel sounds  Musculoskeletal: Normal range of motion  Skin   R third finger: small punctate ulceration on PIP joint of right third middle finger with surrounding erythema. No surrounding erythema in surrounding fingers or distal from lesion(improved from prior exam)  Left thumb: smaller ulceration on lateral base of thumb with no surrounding erythema   Neurologic alert oriented x4, no appreciable focal deficits  Data Reviewed: Basic Metabolic Panel: Recent Labs  Lab 11/07/18 1806 11/08/18 0330  NA 140 139  K 3.7 4.4  CL 112* 108  CO2 21* 21*  GLUCOSE 124* 105*  BUN 14 12  CREATININE 0.86 0.94  CALCIUM 8.4* 9.0   Liver Function Tests: No results for input(s): AST, ALT, ALKPHOS, BILITOT, PROT, ALBUMIN in the last 168 hours. No results for input(s): LIPASE, AMYLASE in the last 168 hours. No results  for input(s): AMMONIA in the last 168 hours. CBC: Recent Labs  Lab 11/07/18 1806 11/09/18 1447 11/10/18 0937  WBC 6.1 7.2 6.9  NEUTROABS 3.6  --   --   HGB 12.1 11.5* 10.8*  HCT 39.1 36.3 35.7*  MCV 94.7 92.1 93.7  PLT 263 245 249   Cardiac Enzymes: No results for input(s): CKTOTAL, CKMB, CKMBINDEX, TROPONINI in the last 168 hours. BNP (last 3 results) No results for input(s): BNP in the last 8760 hours.  ProBNP (last 3 results) No results for  input(s): PROBNP in the last 8760 hours.  CBG: No results for input(s): GLUCAP in the last 168 hours.  No results found for this or any previous visit (from the past 240 hour(s)).   Studies: Ct Chest W Contrast  Result Date: 11/10/2018 CLINICAL DATA:  History of breast cancer. New arm pain and swelling. Additional history of GYN carcinoma. EXAM: CT CHEST WITH CONTRAST TECHNIQUE: Multidetector CT imaging of the chest was performed during intravenous contrast administration. CONTRAST:  54m OMNIPAQUE IOHEXOL 300 MG/ML  SOLN COMPARISON:  CT 05/20/2015 FINDINGS: Cardiovascular: Port in the anterior chest wall with tip in distal SVC. No significant vascular findings. Normal heart size. No pericardial effusion. Mediastinum/Nodes: No axillary supraclavicular adenopathy. No mediastinal hilar adenopathy. No pericardial effusion. Esophagus normal Lungs/Pleura: 5 mm nodule in the RIGHT middle lobe along the fissure is unchanged. (Image 75/4) no new pulmonary nodules identified. Airways normal. On coronal imaging there is a convex lesion over the diaphragm which is fat density. This corresponds to the nodule described on comparison CT. Findings consistent with benign fat density subpleural lesion Upper Abdomen: Limited view of the liver, kidneys, pancreas are unremarkable. Normal adrenal glands. Musculoskeletal: No acute osseous abnormality. IMPRESSION: 1. No evidence thoracic metastasis. 2. No change from comparison exam. Electronically Signed   By: SSuzy BouchardM.D.   On: 11/10/2018 09:16   Vas UKoreaUpper Extremity Venous Duplex  Result Date: 11/09/2018 UPPER VENOUS STUDY  Indications: Swelling, and Pain Performing Technologist: MAbram SanderRVS  Examination Guidelines: A complete evaluation includes B-mode imaging, spectral Doppler, color Doppler, and power Doppler as needed of all accessible portions of each vessel. Bilateral testing is considered an integral part of a complete examination. Limited  examinations for reoccurring indications may be performed as noted.  Right Findings: +----------+------------+----------+---------+-----------+-------+ RIGHT     CompressiblePropertiesPhasicitySpontaneousSummary +----------+------------+----------+---------+-----------+-------+ IJV           Full                 Yes       Yes            +----------+------------+----------+---------+-----------+-------+ Subclavian    Full                 Yes       Yes            +----------+------------+----------+---------+-----------+-------+ Axillary      Full                 Yes       Yes            +----------+------------+----------+---------+-----------+-------+ Brachial      Full                 Yes       Yes            +----------+------------+----------+---------+-----------+-------+ Radial        Full                                          +----------+------------+----------+---------+-----------+-------+  Ulnar         Full                                          +----------+------------+----------+---------+-----------+-------+ Cephalic      Full                                          +----------+------------+----------+---------+-----------+-------+ Basilic       Full                                          +----------+------------+----------+---------+-----------+-------+  Left Findings: +----------+------------+----------+---------+-----------+-------+ LEFT      CompressiblePropertiesPhasicitySpontaneousSummary +----------+------------+----------+---------+-----------+-------+ Subclavian                         Yes       Yes            +----------+------------+----------+---------+-----------+-------+  Summary:  Right: No evidence of deep vein thrombosis in the upper extremity. No evidence of superficial vein thrombosis in the upper extremity.  Left: No evidence of thrombosis in the subclavian.  *See table(s) above for measurements and  observations.  Diagnosing physician: Monica Martinez MD Electronically signed by Monica Martinez MD on 11/09/2018 at 2:35:57 PM.    Final     Scheduled Meds: . budesonide  0.5 mg Inhalation BID  . clonazePAM  1 mg Oral TID  . doxycycline  100 mg Oral Q12H  . enoxaparin (LOVENOX) injection  40 mg Subcutaneous Q24H  . escitalopram  20 mg Oral Daily  . estradiol  2 mg Oral Daily  . fluticasone furoate-vilanterol  1 puff Inhalation Daily  . furosemide  20 mg Oral BID  . hydrocortisone sod succinate (SOLU-CORTEF) inj  100 mg Intravenous Daily  . levothyroxine  112 mcg Oral QAC breakfast  . metoprolol succinate  37.5 mg Oral Daily  . potassium citrate  10 mEq Oral Daily  . rosuvastatin  10 mg Oral Daily  . sodium chloride flush  3 mL Intravenous Q12H  . topiramate  150 mg Oral QHS   Continuous Infusions: . sodium chloride 1,000 mL (11/10/18 0357)    Principal Problem:   Cellulitis of right finger Active Problems:   Hodgkin lymphoma ([place])   Breast cancer (H[facility])   Thyroid cancer ([place])   Adrenal insufficiency (H[facility])   Hypertension   Hyperlipidemia   Edema   Right heart failure ([place])   Nephrolithiasis   Oxygen dependent   Migraine      Moriyah Byington D Montoya Brandel  [facility]

## 2018-11-11 DIAGNOSIS — R042 Hemoptysis: Secondary | ICD-10-CM | POA: Clinically undetermined

## 2018-11-11 LAB — EXPECTORATED SPUTUM ASSESSMENT W GRAM STAIN, RFLX TO RESP C

## 2018-11-11 MED ORDER — DOUBLE ANTIBIOTIC 500-10000 UNIT/GM EX OINT
TOPICAL_OINTMENT | Freq: Two times a day (BID) | CUTANEOUS | Status: DC
  Filled 2018-11-11: qty 28.4

## 2018-11-11 MED ORDER — CLINDAMYCIN PHOSPHATE 600 MG/50ML IV SOLN
600.0000 mg | Freq: Three times a day (TID) | INTRAVENOUS | Status: DC
  Administered 2018-11-11 – 2018-11-12 (×4): 600 mg via INTRAVENOUS
  Filled 2018-11-11 (×7): qty 50

## 2018-11-11 MED ORDER — HYDROCORTISONE 20 MG PO TABS
20.0000 mg | ORAL_TABLET | Freq: Every morning | ORAL | Status: DC
  Administered 2018-11-12: 20 mg via ORAL
  Filled 2018-11-11: qty 1

## 2018-11-11 MED ORDER — HYDROCORTISONE 20 MG PO TABS: 20.0000 mg | ORAL_TABLET | Freq: Every day | ORAL | Status: DC

## 2018-11-11 MED ORDER — HYDROCORTISONE 10 MG PO TABS
10.0000 mg | ORAL_TABLET | Freq: Every evening | ORAL | Status: DC
  Administered 2018-11-11 – 2018-11-12 (×2): 10 mg via ORAL
  Filled 2018-11-11 (×2): qty 1

## 2018-11-11 NOTE — Progress Notes (Signed)
Pt coughed up large bloody sputum. No change in VS trend of baseline assessment. Did notify MD who came to assess/talk to patient. Respiratory sputum collected and send to lab. Will continue to monitor.

## 2018-11-11 NOTE — Progress Notes (Signed)
[facility] PROGRESS NOTE  Susan SHIPPEE TMM:[id number] DOB: Jan 14, 1971 DOA: 11/07/2018 PCP: Su Grand, MD  Assessment/Plan: 1. Right third finger ulceration, concerning for cellulitis, improving.  X-ray imaging shows involvement of subcutaneous tissue with no obvious involvement with bone.  Dr. Amedeo Plenty with surgery following no signs of abscess/compartment syndrome or lymphangitis.  Given she has no sepsis physiology and no obvious worsening erythema she was transitioned to doxycycline on 2/10 but now has new lesions on left wrist and forearm and right forearm (slight abrasions with some surrounding erythema).  Will discontinue doxycycline and start clindamycin since she has done well with this in the past (prior presentation at [facility] during November 2019) will start clindamycin and closely monitor over next 2 4 hours to ensure no fevers or worsening clinical status.  TTE negative for vegetation, no DVT in R arm, no clot in subclavian.  Blood cultures if becomes febrile  2. Small volume hemoptysis episodes.  Has occurred 3 times.  Occurring with cough.  CT chest negative for any new masses, chronic nodule shown.  Sending for culture to assess for potential infection given some diminished breath sounds in left bases.  If worsens or increases in frequency may consider pulmonary consultation for potential bronc given patient's quite complicated pulmonary history  3. Subacute history of blisters developing into ulcers and bilateral hand swelling.  Ongoing for 3 months.  Is unclear if this is related to some autoimmune process (has history of rainouts phenomenon).  CCP was obtained on 08/2018 at ED vist and was unremarkable.  HIV and ANA negative here.  Unclear family history as patient is adopted, does not have any other systemic symptoms (no joint aches, no joint pain,).  CT chest was negative for any new metastatic lesions or any lymphadenopathy as scheduled outpatient rheumatology referral.     4. 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  Discontinue IV hydrocortisone transition to oral stress dosing 20 mg of hydrocortisone x3 days followed by reinitiation of her home regimen 10 mg in the a.m. 5 mg in p.m.  Closely monitor over next 24 hours to ensure stability  5. Thrombosis of basilic vein (71/29/2909).  Status post 14-day treatment Lovenox by oncologist in 08/2018  6. Chronic hypoxic respiratory failure, stable on home 3 L nasal cannula.  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  Followed by [facility] pulmonology.  Continue home Breo Pulmicort added here.  Albuterol PRN  7. History of aortic stenosis. Documented in [facility]. Systolic murmur heard on exam. Last TTE at [facility] on 08/2018 showed no stenosis. No vegetation on TTE here, does have tricupsid valve structure/calcification moderate.   8. Chronic nausea.  Associated with history of migraines reports allergies to most antiemetics, maintained on PRN IV Benadryl injections, outpatient port recently placed for administration  9. 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  Followed by Dr. Wendee Beavers in [facility]  10. Stage IV Hodgkin's disease, diagnosed at age 9.  Status post chemotherapy and mantle field radiotherapy  11. GERD.  Continue home Dexilant  12. HLD,stable. Home crestor  13. Postsurgical hypothyroidism. TSH suppressed but not accurate given prior pituitary resection. Follow free T4. Continue home synthroid at current dose  14. History of CHF with reduced EF.Marland Kitchen Last TTE 08/2018 showed normal systolic and diastolic function. BNP  here unremarkable. Will continue home lasix  Code Status: FULL code Family Communication:  husband  at bedside  Disposition Plan: monitor on clindamycin, monitor for any recurrent/increased frequency of hemoptysis hopeful for discharge in next 24 hours   Consultants:  Hand Surgery  Procedures:  none  Antibiotics:  11/07/2018. IV vancomycin  HPI/Subjective:  [name] is a 48 y.o. year old female with medical history significant for Hodgkin's disease s/p mantle radiation, breast ca (path DCIS?), ovarian and cervical cancer (pathology unclear), pre-cancerous thyroid nodules s/p thyroidectomy now surgical hypothyroidism, pituitary adenoma s/p resection with secondary adrenal insufficiency, severe GERD, HTN, HL, anxiety/panic disorder, migraines, and 35-monthhistory of bilateral hand swelling with intermittent ulcerationsrecently treated with Keflex followed by Rocephin by PCP then given 10-day course of clindamycin in ED on 08/2018 who presented on 11/07/2018 with a few weeks of blisters to her right hand that developed into an ulcer with worsening redness, pain and swelling in same pain and was found to have cellulitis of right third finger.   She was first evaluated for blisters which developed into ulcerations of both hands by her PCP 08/2018.  At that time she was treated with one-time dose of Rocephin in the office followed by Keflex.  Due to subjective fevers at that time she was evaluated in the ED and discharged on clindamycin x 11days, left upper extremity ultrasound at that time showed associated superficial occlusive thrombus of left basilic vein with no DVT. ED evaluation at that time also included CTA negative for PE  Since then ambulatory referral hematology was placed by her primary oncologist. She has had persistent swelling in both hands without any new ulcerations until a few weeks ago with this current presentation.  A week ago noticed right middle finger blisters in middle of finger. It rsolved on its own and she covered with neosporin and dressed for a few days  then it turned black with some purulent drainage. Then started noticing redness, numbness x 2 days. Pain extending in to hand during that time as well.  E visit yesterday---who advised being seen by provider face to face to rule out systemic infection. Denies fevers, chills. Chronic nausea. No vomiting, abd pain, no history of trauma/injury to hand. No other ulcers. No joint pain or aches. No dysuria, no diarrhea/constipation  This a.m. she had 2 episodes of small-volume hemoptysis while coughing.  Reports some increased shortness of breath.  No fevers or chills.  Also shows new small lesions on left wrist and left forearm and right forearm..  Objective: Vitals:   11/11/18 0549 11/11/18 0842  BP: 117/73 103/69  Pulse: 81 80  Resp: 16 18  Temp: (!) 97.2 F (36.2 C) 98.1 F (36.7 C)  SpO2: 100% 100%    Intake/Output Summary (Last 24 hours) at 11/11/2018 1014 Last data filed at 11/11/2018 0900 Gross per 24 hour  Intake 1060 ml  Output -  Net 1060 ml   Filed Weights   11/07/18 1635  Weight: 80 kg    Exam:   General: Lying in bed comfortably, no distress   Cardiovascular: Regular rate and rhythm with appreciable systolic ejection murmur, no edema lower extremities, pitting edema in bilateral hands to wrist  Respiratory: Normal respiratory effort on 3 L nasal cannula, no wheezes or crackles on lung exam, diminished breath sounds on right lung base  Abdomen: Soft, nondistended, nontender, normal bowel sounds  Musculoskeletal: Normal range of motion  Skin   R third finger: small punctate ulceration on  PIP joint of right third middle finger with surrounding erythema. No surrounding erythema in surrounding fingers or distal from lesion(improved from prior exam)  Left thumb: smaller ulceration on lateral base of thumb with no surrounding erythema  R forearm: new small abrasion with surrounding erythema and warmth no drainage  L wrist and forearm new circular superficial abrasion  with surrounding erythema, warmth, no active drainage, no fluctuance, mildly tender   Neurologic alert oriented x4, no appreciable focal deficits  Psych: Anxious mood  Data Reviewed: Basic Metabolic Panel: Recent Labs  Lab 11/07/18 1806 11/08/18 0330  NA 140 139  K 3.7 4.4  CL 112* 108  CO2 21* 21*  GLUCOSE 124* 105*  BUN 14 12  CREATININE 0.86 0.94  CALCIUM 8.4* 9.0   Liver Function Tests: No results for input(s): AST, ALT, ALKPHOS, BILITOT, PROT, ALBUMIN in the last 168 hours. No results for input(s): LIPASE, AMYLASE in the last 168 hours. No results for input(s): AMMONIA in the last 168 hours. CBC: Recent Labs  Lab 11/07/18 1806 11/09/18 1447 11/10/18 0937  WBC 6.1 7.2 6.9  NEUTROABS 3.6  --   --   HGB 12.1 11.5* 10.8*  HCT 39.1 36.3 35.7*  MCV 94.7 92.1 93.7  PLT 263 245 249   Cardiac Enzymes: No results for input(s): CKTOTAL, CKMB, CKMBINDEX, TROPONINI in the last 168 hours. BNP (last 3 results) No results for input(s): BNP in the last 8760 hours.  ProBNP (last 3 results) No results for input(s): PROBNP in the last 8760 hours.  CBG: No results for input(s): GLUCAP in the last 168 hours.  No results found for this or any previous visit (from the past 240 hour(s)).   Studies: Ct Chest W Contrast  Result Date: 11/10/2018 CLINICAL DATA:  History of breast cancer. New arm pain and swelling. Additional history of GYN carcinoma. EXAM: CT CHEST WITH CONTRAST TECHNIQUE: Multidetector CT imaging of the chest was performed during intravenous contrast administration. CONTRAST:  30m OMNIPAQUE IOHEXOL 300 MG/ML  SOLN COMPARISON:  CT 05/20/2015 FINDINGS: Cardiovascular: Port in the anterior chest wall with tip in distal SVC. No significant vascular findings. Normal heart size. No pericardial effusion. Mediastinum/Nodes: No axillary supraclavicular adenopathy. No mediastinal hilar adenopathy. No pericardial effusion. Esophagus normal Lungs/Pleura: 5 mm nodule in the RIGHT  middle lobe along the fissure is unchanged. (Image 75/4) no new pulmonary nodules identified. Airways normal. On coronal imaging there is a convex lesion over the diaphragm which is fat density. This corresponds to the nodule described on comparison CT. Findings consistent with benign fat density subpleural lesion Upper Abdomen: Limited view of the liver, kidneys, pancreas are unremarkable. Normal adrenal glands. Musculoskeletal: No acute osseous abnormality. IMPRESSION: 1. No evidence thoracic metastasis. 2. No change from comparison exam. Electronically Signed   By: SSuzy BouchardM.D.   On: 11/10/2018 09:16   Vas UKoreaUpper Extremity Venous Duplex  Result Date: 11/09/2018 UPPER VENOUS STUDY  Indications: Swelling, and Pain Performing Technologist: MAbram SanderRVS  Examination Guidelines: A complete evaluation includes B-mode imaging, spectral Doppler, color Doppler, and power Doppler as needed of all accessible portions of each vessel. Bilateral testing is considered an integral part of a complete examination. Limited examinations for reoccurring indications may be performed as noted.  Right Findings: +----------+------------+----------+---------+-----------+-------+ RIGHT     CompressiblePropertiesPhasicitySpontaneousSummary +----------+------------+----------+---------+-----------+-------+ IJV           Full                 Yes  Yes            +----------+------------+----------+---------+-----------+-------+ Subclavian    Full                 Yes       Yes            +----------+------------+----------+---------+-----------+-------+ Axillary      Full                 Yes       Yes            +----------+------------+----------+---------+-----------+-------+ Brachial      Full                 Yes       Yes            +----------+------------+----------+---------+-----------+-------+ Radial        Full                                           +----------+------------+----------+---------+-----------+-------+ Ulnar         Full                                          +----------+------------+----------+---------+-----------+-------+ Cephalic      Full                                          +----------+------------+----------+---------+-----------+-------+ Basilic       Full                                          +----------+------------+----------+---------+-----------+-------+  Left Findings: +----------+------------+----------+---------+-----------+-------+ LEFT      CompressiblePropertiesPhasicitySpontaneousSummary +----------+------------+----------+---------+-----------+-------+ Subclavian                         Yes       Yes            +----------+------------+----------+---------+-----------+-------+  Summary:  Right: No evidence of deep vein thrombosis in the upper extremity. No evidence of superficial vein thrombosis in the upper extremity.  Left: No evidence of thrombosis in the subclavian.  *See table(s) above for measurements and observations.  Diagnosing physician: Monica Martinez MD Electronically signed by Monica Martinez MD on 11/09/2018 at 2:35:57 PM.    Final     Scheduled Meds: . budesonide  0.5 mg Inhalation BID  . clonazePAM  1 mg Oral TID  . enoxaparin (LOVENOX) injection  40 mg Subcutaneous Q24H  . escitalopram  20 mg Oral Daily  . estradiol  2 mg Oral Daily  . fluticasone furoate-vilanterol  1 puff Inhalation Daily  . furosemide  20 mg Oral BID  . hydrocortisone  10 mg Oral Daily  . hydrocortisone  5 mg Oral QPM  . levothyroxine  112 mcg Oral QAC breakfast  . metoprolol succinate  37.5 mg Oral Daily  . potassium citrate  10 mEq Oral Daily  . rosuvastatin  10 mg Oral Daily  . sodium chloride flush  3 mL Intravenous Q12H  . topiramate  150 mg Oral QHS   Continuous Infusions: .  sodium chloride 1,000 mL (11/10/18 0357)  . clindamycin (CLEOCIN) IV      Principal  Problem:   Cellulitis of right finger Active Problems:   Hodgkin lymphoma (HCC)   Breast cancer (HCC)   Thyroid cancer (HCC)   Adrenal insufficiency (HCC)   Hypertension   Hyperlipidemia   Edema   Right heart failure ([place])   Nephrolithiasis   Oxygen dependent   Migraine      Yates Weisgerber D Nilo Fallin  [facility]

## 2018-11-11 NOTE — Progress Notes (Signed)
Patient ID: [name], female   DOB: [birth year], 48 y.o.   MRN: [id number] Patient seen at bedside.  I perform periodic checks of her fingers especially the right middle finger to make sure there is no significant infectious sequelae.  She appears to have an eschar over the middle finger without erythema or other problems.  She has multiple excoriations/blisters of unknown etiology.  These do not appear to be deep abscesses or progressing towards a more infectious quality.  I watched her since Friday and do feel she looks fairly stable regarding the middle finger right hand.  This could suggest an autoimmune type phenomenon versus other etiology given her multiple medical problems.  I have discussed with her that I would not have any plans towards any surgical endeavors given the superficial nature and the lack of infectious sequelae developing at present time.Amedeo Plenty MD

## 2018-11-12 ENCOUNTER — Inpatient Hospital Stay (HOSPITAL_COMMUNITY): Payer: BLUE CROSS/BLUE SHIELD

## 2018-11-12 DIAGNOSIS — N200 Calculus of kidney: Secondary | ICD-10-CM

## 2018-11-12 LAB — COMPREHENSIVE METABOLIC PANEL
ALBUMIN: 3.2 g/dL — AB (ref 3.5–5.0)
ALT: 32 U/L (ref 0–44)
AST: 30 U/L (ref 15–41)
Alkaline Phosphatase: 39 U/L (ref 38–126)
Anion gap: 10 (ref 5–15)
BUN: 15 mg/dL (ref 6–20)
CO2: 26 mmol/L (ref 22–32)
Calcium: 8.6 mg/dL — ABNORMAL LOW (ref 8.9–10.3)
Chloride: 104 mmol/L (ref 98–111)
Creatinine, Ser: 1.04 mg/dL — ABNORMAL HIGH (ref 0.44–1.00)
GFR calc Af Amer: >60 mL/min (ref >60)
GFR calc non Af Amer: >60 mL/min (ref >60)
GLUCOSE: 123 mg/dL — AB (ref 70–99)
Potassium: 3.5 mmol/L (ref 3.5–5.1)
Sodium: 140 mmol/L (ref 135–145)
Total Bilirubin: 0.3 mg/dL (ref 0.3–1.2)
Total Protein: 6 g/dL — ABNORMAL LOW (ref 6.5–8.1)

## 2018-11-12 LAB — PHOSPHORUS: Phosphorus: 4.3 mg/dL (ref 2.5–4.6)

## 2018-11-12 LAB — CBC WITH DIFFERENTIAL/PLATELET
Abs Immature Granulocytes: 0.02 10*3/uL (ref 0.00–0.07)
BASOS ABS: 0.1 10*3/uL (ref 0.0–0.1)
Basophils Relative: 1 %
Eosinophils Absolute: 0.2 10*3/uL (ref 0.0–0.5)
Eosinophils Relative: 3 %
HCT: 36.3 % (ref 36.0–46.0)
Hemoglobin: 11.1 g/dL — ABNORMAL LOW (ref 12.0–15.0)
Immature Granulocytes: 0 %
Lymphocytes Relative: 25 %
Lymphs Abs: 1.8 10*3/uL (ref 0.7–4.0)
MCH: 28.3 pg (ref 26.0–34.0)
MCHC: 30.6 g/dL (ref 30.0–36.0)
MCV: 92.6 fL (ref 80.0–100.0)
Monocytes Absolute: 0.7 10*3/uL (ref 0.1–1.0)
Monocytes Relative: 9 %
NRBC: 0 % (ref 0.0–0.2)
Neutro Abs: 4.3 10*3/uL (ref 1.7–7.7)
Neutrophils Relative %: 62 %
Platelets: 258 10*3/uL (ref 150–400)
RBC: 3.92 MIL/uL (ref 3.87–5.11)
RDW: 12.9 % (ref 11.5–15.5)
WBC: 7 10*3/uL (ref 4.0–10.5)

## 2018-11-12 LAB — GLUCOSE, CAPILLARY: Glucose-Capillary: 94 mg/dL (ref 70–99)

## 2018-11-12 LAB — EXPECTORATED SPUTUM ASSESSMENT W GRAM STAIN, RFLX TO RESP C

## 2018-11-12 LAB — MAGNESIUM: Magnesium: 2 mg/dL (ref 1.7–2.4)

## 2018-11-12 MED ORDER — GADOBUTROL 1 MMOL/ML IV SOLN
8.0000 mL | Freq: Once | INTRAVENOUS | Status: AC | PRN
  Administered 2018-11-12: 8 mL via INTRAVENOUS

## 2018-11-12 MED ORDER — SODIUM CHLORIDE 0.9% FLUSH: 10.0000 mL | INTRAVENOUS | Status: DC | PRN

## 2018-11-12 MED ORDER — HYDROCORTISONE 10 MG PO TABS: 10.0000 mg | ORAL_TABLET | Freq: Every day | ORAL | Status: DC

## 2018-11-12 MED ORDER — CEFAZOLIN SODIUM-DEXTROSE 1-4 GM/50ML-% IV SOLN
1.0000 g | Freq: Three times a day (TID) | INTRAVENOUS | Status: DC
  Filled 2018-11-12: qty 50

## 2018-11-12 MED ORDER — HYDROCORTISONE 10 MG PO TABS
10.0000 mg | ORAL_TABLET | Freq: Every day | ORAL | Status: DC
  Administered 2018-11-13: 10 mg via ORAL
  Filled 2018-11-12: qty 1

## 2018-11-12 MED ORDER — HYDROCORTISONE 10 MG PO TABS: 10.0000 mg | ORAL_TABLET | Freq: Every morning | ORAL | Status: DC

## 2018-11-12 MED ORDER — HYDROCORTISONE 5 MG PO TABS: 5.0000 mg | ORAL_TABLET | Freq: Every day | ORAL | Status: DC

## 2018-11-12 MED ORDER — SODIUM CHLORIDE 0.9% FLUSH
10.0000 mL | Freq: Two times a day (BID) | INTRAVENOUS | Status: DC
  Administered 2018-11-12 – 2018-11-20 (×12): 10 mL

## 2018-11-12 MED ORDER — CEFAZOLIN SODIUM-DEXTROSE 1-4 GM/50ML-% IV SOLN
1.0000 g | Freq: Three times a day (TID) | INTRAVENOUS | Status: DC
  Administered 2018-11-12 – 2018-11-15 (×8): 1 g via INTRAVENOUS
  Filled 2018-11-12 (×9): qty 50

## 2018-11-12 NOTE — Care Plan (Signed)
This is a late entry for 11/12/18.  48 year old female with history of Hodgkin's, breast, thyroid and cervical cancer in remission, chronic hypoxemic respiratory failure secondary to unknown etiology, right heart failure. Primarily receives oncologic and pulmonary care [facility]. She presented with bilateral upper extremity edema and small volume hemoptysis. No recent respiratory infection but recently treated cellulitis two weeks ago. Pulmonary consulted for need for bronchoscopic evaluation for hemoptysis.  S: Reports hemoptysis x 2 days after having deep coughs. Denies recent cough, respiratory infection. Denies recent unexplained fevers, chills, chest pain, nausea. Only for worsening bilateral upper extremity edema for which she is being work-up for. She does report recent nosebleeds that began two days ago. Does not feel this is related to her hemoptysis.  O: Blood pressure (!) 142/95, pulse 90, temperature (!) 97.5 F (36.4 C), temperature source Oral, resp. rate 18, height _0  (1.651 m), weight 80 kg, SpO2 100 %.    Intake/Output from previous day:  Intake/Output Summary (Last 24 hours) at 11/12/2018 1426 Last data filed at 11/12/2018 1000 Gross per 24 hour  Intake 240 ml  Output 1 ml  Net 239 ml   Physical Exam: General: Well-appearing, no acute distress HENT: [place], AT, OP clear, MMM Eyes: EOMI, no scleral icterus Respiratory: Clear to auscultation bilaterally.  No crackles, wheezing or rales Cardiovascular: RRR, -M/R/G, no JVD Extremities:2+pitting edema in upper extremities bilaterally, pulses palpable Neuro: AAO x4, CNII-XII grossly intact Skin: Eschar on lateral aspect of 3rd digit on right hand Psych: Anxious mood, normal affect   Interim Data: Lab Results  Component Value Date   WBC 7.0 11/12/2018   HGB 11.1 (L) 11/12/2018   HCT 36.3 11/12/2018   MCV 92.6 11/12/2018   PLT 258 11/12/2018   Lab Results  Component Value Date   NA 140 11/12/2018   K 3.5 11/12/2018   CL 104 11/12/2018   CO2 26 11/12/2018   GLUCOSE 123 (H) 11/12/2018   BUN 15 11/12/2018   CREATININE 1.04 (H) 11/12/2018   CALCIUM 8.6 (L) 11/12/2018    CT Chest 11/11/18: Linear atelectasis in right middle lobe otherwise no parenchymal abnormalities. No mediastinal or hilar adenopathy. Stable lung nodule. Port-a-cath.  Independently reviewed and interpreted by me.  Impression/Plan:  Hemoptysis Unclear etiology. Reviewed [facility] notes for which she was evaluated for chronic cough in June 2019 which patient reports resolution for several months. Hemoptysis and nosebleeds began while inpatient. Patient with prior hx of multiple malignancies with negative CT for cause of bleed.  -Recommended discontinuing DVT ppx -Monitor hemoptysis -Will re-evaluate need for bronchoscopy  Chronic Hypoxemic Respiratory Failure Extensive work-up at [facility]. Last note recommended CPET on room air. She is 100% on 3L. -Wean oxygen for goal saturation 88-92% -Perform ambulatory O2 saturation  Greater than 50% of this patient 81-minute care was spent face-to-face in counseling with the patient/family and reviewed patient's outside hospital records. We discussed medical diagnosis and treatment plan as noted.   Rodman Pickle, M.D. [facility] Pulmonary/Critical Care Medicine Pager: [phone] After hours pager: [phone]

## 2018-11-12 NOTE — Progress Notes (Signed)
Pt placed on pulse ox per MD order and she refused SCD's and foot pumps because she stated they made her feel claustrophobic

## 2018-11-12 NOTE — Consult Note (Addendum)
NAME:  [name], MRN:  [id number], DOB:  Sep 22, 1971, LOS: 5 ADMISSION DATE:  11/07/2018, CONSULTATION DATE:  11/12/18 REFERRING MD:  Teryl Lucy - [facility], CHIEF COMPLAINT:  Hemoptysis  Brief History   48 year old F admitted 2/7 for progressive BUE swelling who has exhibited 3x small volume hemoptysis over hospital course. 2/10 CTA negative for PE but revealed a pulmonary nodule. PCCM consulted for evaluation for need for possible bronchoscopy to evaluate hemoptysis.  History of present illness   49 yo F  With PMH HTN, R sided heart failure, ?chronic hypoxic respiratory failure on home 3-4LNC, osteoporosis and extensive oncologic PMH including Hodgkin's lymphona in remission, Breast cancer with multiple recurrences s/p bilatearal mastectomy now in remission, pituitary adenoma s/p resection, thyroid cancer s/p thyroidectomy with resultant hypothyroidism, ?cervical cancer s/p LEEP who presented 2/7 for progressive BUE swelling. Swelling began multiple months before presentation and intermittently had associated spontaneously occurring and resolving blisters. Several weeks ago, the patient was started on abx (Keflex to Clinda) for apparent cellulitis of an open blister. 1 week prior to presentation the patient reports the onset of multiple small blisters on right 3rd finger, which developed surrounding erythema and dark ulceration at time of presentation to ED.  The patient denied recent illness, fever, chills, but endorsed recent abx treatment as above. The patient reported decreased sensation in affected finger with associated decreased ROM.  In the ED the patient was started on vancomycin, hand surgery was consulted.   Over course of hospitalization, patient has been followed by ortho and IM. Ortho does not feel patient is a surgical candidate. Hospital course has been complicated by small volume hemoptysis x3. CTA chest negative for PE, but small lung nodule noted. PCCM consulted for evaluation.   Patient  was recently hospitalized at [facility], and has several OP follow ups scheduled including anOP pulmonary appointment with Dr. Nancie Neas ([place]) in March, 2020.    Past Medical History  Addison's disease Aortic stenosis Appendicitis Breast cancer, s/p bilateral mastectomy CHF, right heart failure ?Chronic hypoxic respiratory failure GERD Hodgkin lymphoma s/p mantle radiation HTN Migraine Osteoporosis  Raynauds Thyroid cancer s/p thyroidectomy  Hypothyroidism  Cervical cancer s/p LEEP  Lung nodules    Significant Hospital Events   2/7> admitted 2/10> CTA negative for PE. Stable pulmonary nodules compared to prior outside hospital CTs  Consults:  Ortho PCCM  Procedures:   Significant Diagnostic Tests:  XR R Hand 2/7> soft tissue swelling with edema of dorsal metacarpal. No subcutaneous gas. No acute osseous abnormality  CT with contrast 2/10> negative for PE  Micro Data:  2/12 sputum cx>>   Antimicrobials:  Clindamycin 2/11>>   Interim history/subjective:  PCCM consulted for recurring small volume hemoptysis.  Patient on 4LNC, awake, reports feeling nervous about "coughing up blood." Patient's parent's at bedside.  Objective   Blood pressure 114/77, pulse 86, temperature (!) 97.5 F (36.4 C), temperature source Oral, resp. rate 16, height _0  (1.651 m), weight 80 kg, SpO2 100 %.        Intake/Output Summary (Last 24 hours) at 11/12/2018 1304 Last data filed at 11/12/2018 1000 Gross per 24 hour  Intake 240 ml  Output 1 ml  Net 239 ml   Filed Weights   11/07/18 1635  Weight: 80 kg    Examination: General: adult female, seated in bed, NAD HENT: NCAT, patent nares, mmm Lungs: 4LNC, course lung sounds,, no wheezing stridor or crackles Cardiovascular: RRR, systolic ejection murmur, no rub or gallop.  Abdomen: soft, sound, non-distended non-tender Extremities: 3+ pitting edema BUE, non-pitting edema BLE. Decreased ROM R middle and index finger. Small  lesions (approx 1cm) on R middle finger with eschar, no purulent drainage no erythema. Capillary refill < 3 seconds BUE BLE  Neuro: AAOx4. Following commands. Decreased sensation R palmar surface of hand, R middle finger, R index finger.  Psych: Anxious mood and affect    Resolved Hospital Problem list     Assessment & Plan:   Hemoptysis  -Pulmonary nodules stable from prior CTs  -- 09/04/2018 outside hospital CT/PET scan: multiple pulmonary nodules stable from 2017, no uptake on PET scan  -? Chronic respiratory failure on home 3-4LNC, followed by [facility]  P -Continue pulmicort  -Continue breo ellipta  -Continue 3-4LNC -Recommend holding lovenox  -May require bronchoscopy later in the week. Would like to watch patient x at least 24 more hours before a recommendation about bronch procedure can be made to assess volume and quality of hemoptysis after cessation of lovenox  -Recommend ambulatory oxygen requirement assessment  -Scheduled for OP appointment with [facility] in March, 2020   Best practice:  Diet: Thin liquid  Pain/Anxiety/Delirium protocol (if indicated): dilaudid, ultram  VAP protocol (if indicated): n/a DVT prophylaxis: lovenox GI prophylaxis: none Glucose control: monitor   Mobility: ad lib  Code Status: Full  Family Communication: parents at bedside  Disposition: continue current level of care   Labs   CBC: Recent Labs  Lab 11/07/18 1806 11/09/18 1447 11/10/18 0937 11/12/18 0922  WBC 6.1 7.2 6.9 7.0  NEUTROABS 3.6  --   --  4.3  HGB 12.1 11.5* 10.8* 11.1*  HCT 39.1 36.3 35.7* 36.3  MCV 94.7 92.1 93.7 92.6  PLT 263 245 249 086    Basic Metabolic Panel: Recent Labs  Lab 11/07/18 1806 11/08/18 0330 11/12/18 0922  NA 140 139 140  K 3.7 4.4 3.5  CL 112* 108 104  CO2 21* 21* 26  GLUCOSE 124* 105* 123*  BUN _0 CREATININE 0.86 0.94 1.04*  CALCIUM 8.4* 9.0 8.6*  MG  --   --  2.0  PHOS  --   --  4.3   GFR: Estimated Creatinine  Clearance: 69.9 mL/min (A) (by C-G formula based on SCr of 1.04 mg/dL (H)). Recent Labs  Lab 11/07/18 1806 11/09/18 1447 11/10/18 0937 11/12/18 0922  WBC 6.1 7.2 6.9 7.0  LATICACIDVEN 1.3  --   --   --     Liver Function Tests: Recent Labs  Lab 11/12/18 0922  AST 30  ALT 32  ALKPHOS 39  BILITOT 0.3  PROT 6.0*  ALBUMIN 3.2*   No results for input(s): LIPASE, AMYLASE in the last 168 hours. No results for input(s): AMMONIA in the last 168 hours.  ABG    Component Value Date/Time   PHART 7.296 (L) 04/02/2018 1343   PCO2ART 40.8 04/02/2018 1343   PO2ART 140.0 (H) 04/02/2018 1343   HCO3 19.9 (L) 04/02/2018 1343   HCO3 20.8 04/02/2018 1343   TCO2 21 (L) 04/02/2018 1343   TCO2 22 04/02/2018 1343   ACIDBASEDEF 6.0 (H) 04/02/2018 1343   ACIDBASEDEF 6.0 (H) 04/02/2018 1343   O2SAT 99.0 04/02/2018 1343   O2SAT 65.0 04/02/2018 1343     Coagulation Profile: No results for input(s): INR, PROTIME in the last 168 hours.  Cardiac Enzymes: No results for input(s): CKTOTAL, CKMB, CKMBINDEX, TROPONINI in the last 168 hours.  HbA1C: Hgb A1c MFr Bld  Date/Time Value Ref  Range Status  03/18/2010 05:40 AM  <5.7 % Final   5.4 (NOTE)                                                                       According to the ADA Clinical Practice Recommendations for 2011, when HbA1c is used as a screening test:   >=6.5%   Diagnostic of Diabetes Mellitus           (if abnormal result  is confirmed)  5.7-6.4%   Increased risk of developing Diabetes Mellitus  References:Diagnosis and Classification of Diabetes Mellitus,Diabetes JEHU,3149,70(YOVZC 1):S62-S69 and Standards of Medical Care in         Diabetes - 2011,Diabetes HYIF,0277,41  (Suppl 1):S11-S61.    CBG: Recent Labs  Lab 11/12/18 1216  GLUCAP 94    Review of Systems:    Denies dizziness Denies chest pain, denies chest tightness, denies palpitations Denies wheezing, denies recent respiratory illness, denies chronic cough,  endorses home O2 use (3-4LNC)  Endorses hemoptysis x 3 days in hospital  Endorses recent abx use-- cellulitis (Keflex, Clindamycin) prior to admission  Endorses weakness of RUE, decreased ROM right hand   Past Medical History  She,  has a past medical history of Addison's disease ([place]), Adrenal insufficiency ([place]), Aortic stenosis, Aortic stenosis, Appendicitis (12/19/09), Breast cancer ([place]), Chest pain, CHF with right heart failure ([place]) (04/17/2017), GERD (gastroesophageal reflux disease), Hodgkin lymphoma ([place]), Hypertension, Osteoporosis, Palpitations, Raynaud phenomenon, SVT (supraventricular tachycardia) ([place]), Tachycardia, and Thyroid cancer ([place]).   Surgical History    Past Surgical History:  Procedure Laterality Date  . ABDOMINAL HYSTERECTOMY    . APPENDECTOMY    . CARDIAC CATHETERIZATION  05/18/09   NORMAL CATH  . PITUITARY SURGERY    . RIGHT/LEFT HEART CATH AND CORONARY ANGIOGRAPHY N/A 04/02/2018   Procedure: RIGHT/LEFT HEART CATH AND CORONARY ANGIOGRAPHY;  Surgeon: Burnell Blanks, MD;  Location: [facility];  Service: Cardiovascular;  Laterality: N/A;     Social History   reports that she has never smoked. She has never used smokeless tobacco. She reports current alcohol use. She reports that she does not use drugs.   Family History   Her Family history is unknown by patient.   Allergies Allergies  Allergen Reactions  . Cymbalta [Duloxetine Hcl] Anxiety  . Hydromorphone Other (See Comments)    BP drop and heart rate drops  . Buprenorphine Hcl Hives  . Compazine     unknown  . Metoclopramide Other (See Comments)    contractures  . Morphine And Related Hives  . Na Ferric Gluc Cplx In Sucrose Other (See Comments)    unknown  . Ondansetron Rash    Whelps  . Promethazine Hcl Hives  . Tegaderm Ag Mesh [Silver] Rash    Old formulation only, is able to tolerate new formulation     Home Medications  Prior to Admission medications   Medication Sig Start  Date End Date Taking? Authorizing Provider  albuterol (VENTOLIN HFA) 108 (90 Base) MCG/ACT inhaler Inhale 2 puffs into the lungs every 6 (six) hours as needed for wheezing or shortness of breath.   Yes [provider]  Alum & Mag Hydroxide-Simeth (GI COCKTAIL) SUSP suspension Take 30 mLs daily as needed by  mouth for indigestion (BLEEDING ULCERS). Shake well.   Yes [provider]  beclomethasone (QVAR) 80 MCG/ACT inhaler Inhale 2 puffs into the lungs 2 (two) times daily.   Yes [provider]  BREO ELLIPTA 200-25 MCG/INH AEPB INHALE 1 INHALATION INTO THE LUNGS ONCE DAILY. 02/10/16  Yes [provider]  clonazePAM (KLONOPIN) 1 MG tablet Take 1 mg by mouth 3 (three) times daily.   Yes [provider]  dexlansoprazole (DEXILANT) 60 MG capsule Take 60 mg by mouth daily.     Yes [provider]  Diclofenac Potassium (CAMBIA) 50 MG PACK Take 1 packet by mouth 3 (three) times daily as needed. AS NEEDED FOR HEADACHES 10/10/15  Yes [provider]  diphenhydrAMINE (BENADRYL) 50 MG/ML injection Inject 25 mg into the vein every 6 (six) hours as needed (nausea).   Yes [provider]  escitalopram (LEXAPRO) 20 MG tablet Take 20 mg by mouth daily.  04/24/16  Yes [provider]  estradiol (ESTRACE) 2 MG tablet Take 2 mg by mouth daily. 02/01/16  Yes [provider]  Ferrous Sulfate Dried (SLOW RELEASE IRON) 45 MG TBCR Take 1 tablet at bedtime by mouth.   Yes [provider]  furosemide (LASIX) 20 MG tablet Take 20 mg by mouth 2 (two) times daily.   Yes [provider]  hydrocortisone (CORTEF) 10 MG tablet Take 5-10 mg by mouth 2 (two) times daily. Take 10 mg in the am and 5 mg in the pm   Yes [provider]  levothyroxine (SYNTHROID, LEVOTHROID) 112 MCG tablet Take 112 mcg by mouth daily before breakfast.   Yes [provider]  metoprolol succinate (TOPROL-XL) 25 MG 24 hr tablet Take 37.5 mg by  mouth daily.   Yes [provider]  OVER THE COUNTER MEDICATION Take 500 mg by mouth daily. Tumeric cucurmin   Yes [provider]  potassium citrate (UROCIT-K) 10 MEQ (1080 MG) SR tablet Take 10 mEq by mouth daily. Break pill in half to minimize discomfort/difficulty swallowing. 08/23/18  Yes [provider]  rosuvastatin (CRESTOR) 10 MG tablet Take 10 mg by mouth daily.  02/28/18  Yes [provider]  sucralfate (CARAFATE) 1 g tablet Take 1 g as needed by mouth (FOR ULCERS).   Yes [provider]  topiramate (TOPAMAX) 100 MG tablet Take 150 mg by mouth at bedtime.    Yes [provider]  traMADol (ULTRAM) 50 MG tablet Take 50 mg by mouth daily as needed (migraine).  06/28/16  Yes [provider]  zolpidem (AMBIEN CR) 12.5 MG CR tablet Take 12.5 mg by mouth at bedtime.     Yes [provider]  potassium chloride (K-DUR) 10 MEQ tablet Take 1 tablet (10 mEq total) by mouth daily. 01/23/18 04/23/18  Nahser, Wonda Cheng, MD     Eliseo Gum MSN, AGACNP-BC [place] 11/12/2018, 1:04 PM

## 2018-11-12 NOTE — Progress Notes (Signed)
Patient ID: [name], female   DOB: [birth year], 48 y.o.   MRN: [id number] Patient seen and examined.  At present time the patient and I discussed all issues plans and concerns.  The areas about her hands look very stable.  I watched her since Friday last week.  Given these issues and the stability I will sign off.  If there are any changes please contact my office.  I will be away from Friday till Thursday next week but my partners will be covering.  If there is any problems please let us know.  I discussed these issues with the patient and she is aware of my absence during these timeframes.  At present time I do feel her hand exam is stable and there is no evidence of deep infection.  Amedeo Plenty MD

## 2018-11-12 NOTE — Progress Notes (Signed)
Pharmacy Antibiotic Note  [name] is a 48 y.o. female admitted on 11/07/2018 with cellulitis.  Pharmacy has been consulted for cefazolin dosing. Right third finger ulceration, concerning for cellulitis but no bone involvement on xray. Initially started on vancomycin then transitioned to doxycycline but switched to clindamycin when new lesions appeared on L wrist and forearm and R forearm. No new fevers, and WBC stable around 7.0. TTE negative for vegetation. Possibly autoimmune etiology.  Plan: Cefazolin 1g IV every 8 hours.  Monitor clinical picture, renal function F/U C&S, abx de-escalation, LOT   Height: 5' 5" (165.1 cm) Weight: 176 lb 5.9 oz (80 kg) IBW/kg (Calculated) : 57  Temp (24hrs), Avg:97.9 F (36.6 C), Min:97.5 F (36.4 C), Max:98.2 F (36.8 C)  Recent Labs  Lab 11/07/18 1806 11/08/18 0330 11/09/18 1447 11/10/18 0937 11/12/18 0922  WBC 6.1  --  7.2 6.9 7.0  CREATININE 0.86 0.94  --   --  1.04*  LATICACIDVEN 1.3  --   --   --   --     Estimated Creatinine Clearance: 69.9 mL/min (A) (by C-G formula based on SCr of 1.04 mg/dL (H)).    Allergies  Allergen Reactions  . Cymbalta [Duloxetine Hcl] Anxiety  . Hydromorphone Other (See Comments)    BP drop and heart rate drops  . Buprenorphine Hcl Hives  . Compazine     unknown  . Metoclopramide Other (See Comments)    contractures  . Morphine And Related Hives  . Na Ferric Gluc Cplx In Sucrose Other (See Comments)    unknown  . Ondansetron Rash    Whelps  . Promethazine Hcl Hives  . Tegaderm Ag Mesh [Silver] Rash    Old formulation only, is able to tolerate new formulation    Antimicrobials this admission: Keflex >> Clinda PTA 2/7 Vanc >>2/9 2/10 doxy>2/11 2/11 clinda>>2/12 2/12 cefazolin>>  Dose adjustments this admission: n/a  Microbiology results: 2/12 Sputum: needs recollection  Thank you for allowing pharmacy to be a part of this patient's care.   Brendolyn Patty, PharmD PGY1 Pharmacy  Resident Phone [phone]  11/12/2018   6:47 PM

## 2018-11-12 NOTE — Progress Notes (Signed)
PROGRESS NOTE    [name]  JKD:[id number] DOB: 03/30/1971 DOA: 11/07/2018 PCP: Su Grand, MD   Brief Narrative:  [name] is a 48 y.o. 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   She was first evaluated for blisters which developed into ulcerations of both hands by her PCP 08/2018.  At that time she was treated with one-time dose of Rocephin in the office followed by Keflex.  Due to subjective fevers at that time she was evaluated in the ED and discharged on clindamycin x 11days, left upper extremity ultrasound at that time showed associated superficial occlusive thrombus of left basilic vein with no DVT. ED evaluation at that time also included CTA negative for PE  Since then ambulatory referral to Rheumatology was placed by her primary oncologist. She has had persistent swelling in both hands without any new ulcerations until a few weeks ago with this current presentation.  A week ago noticed right middle finger blisters in middle of finger. It rsolved on its own and she covered with neosporin and dressed for a few days then it turned black with some purulent drainage. Then started noticing redness, numbness x 2 days. Pain extending in to hand during that time as  well.  E visit yesterday---who advised being seen by provider face to face to rule out systemic infection. Denies fevers, chills. Chronic nausea. No vomiting, abd pain, no history of trauma/injury to hand. No other ulcers. No joint pain or aches. No dysuria, no diarrhea/constipation  This a.m. she had 2 episodes of small-volume hemoptysis while coughing.  Reports some increased shortness of breath.  No fevers or chills.  Also shows new small lesions on left wrist and left forearm and right forearm..  **ID, Pulmonary and Hand Surgery were consulted and workup still pending.   Assessment & Plan:   Principal Problem:   Cellulitis of right finger Active Problems:   Hodgkin lymphoma (HCC)   Breast cancer (HCC)   Thyroid cancer (HCC)   Adrenal insufficiency (HCC)   Hypertension   Hyperlipidemia   Edema   Right heart failure (HCC)   Nephrolithiasis   Oxygen dependent   Migraine   Hemoptysis  Right third finger ulceration, Concerning for cellulitis -X-ray imaging shows involvement of subcutaneous tissue with no obvious involvement with bone.   -Dr. GAmedeo Plentywith surgery following no signs of abscess/compartment syndrome or lymphangitis.   -Given she has no sepsis physiology and no obvious worsening erythema she was transitioned to doxycycline on 2/10 but now has new lesions on left wrist and forearm and right forearm (slight abrasions with some surrounding erythema).   -Discontinued Doxycycline and started Clindamycin since she has done well with this in the past ([facility]and closely monitor over next 24 hours to ensure no fevers or worsening clinical status but discussed with ID Dr. VTommy Medalwho  recommended changing to Cefazolin and obtaining an MRI of the Hand -MRI of Right Hand ordered along with Inflammatory Markers (ESR and CRP) and along with ANCA titers, Rheumatoid Factor -Also obtaining UDS   -TTE negative for vegetation, no DVT in R arm, no clot  in subclavian.   -Blood cultures if becomes febrile  Small volume hemoptysis episodes -Becoming more frequent.  Occurring with cough.   -CT chest negative for any new masses, chronic nodule shown.   -Sending for culture to assess for potential infection given some diminished breath sounds in left bases.  -Will Consult Pulmonary for further evaluation and recommendations   Subacute history of blisters developing into ulcers and bilateral hand swelling.   -Ongoing for 3 months.  Is unclear if this is related to some autoimmune process (has history of Raynaud's phenomenon).   -CCP was obtained on 08/2018 at ED vist and was unremarkable.   -HIV and ANA negative here. Will obtain ANCA Titers, Rhuematoid Factor, ESR and CRP and also obtain a UDS -Unclear family history as patient is adopted, does not have any other systemic symptoms (no joint aches, no joint pain,). -CT chest was negative for any new metastatic lesions or any lymphadenopathy  -May give Vascular Surgery a call as well -Has an outpatient rheumatology referral scheduled in March   Adrenal insufficiency secondary to pituitary resection for adenoma (2002, status post gamma knife surgery for regrowth of tumor in 2015).   -Discontinued IV hydrocortisone and transitioned to oral stress dosing 20 mg of hydrocortisone x3 days followed by reinitiation of her home regimen 10 mg in the a.m. 5 mg in p.m.   -Continue Closely monitor over next 24 hours to ensure stability  Thrombosis of basilic vein (41/74/0814).   -Status post 14-day treatment Lovenox by oncologist in 08/2018 -Continue to Monitor   Chronic hypoxic respiratory failure, stable on home 3 L nasal cannula.   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   -Followed by [facility] pulmonology.  Continue home Breo  Pulmicort added here.  Albuterol PRN  History of Aortic Stenosis.  -Documented in [facility]. Systolic murmur heard on exam. Last TTE at [facility] on 08/2018 showed no stenosis.  -No vegetation on TTE here, does have tricupsid valve structure/calcification moderate.   Chronic Nausea.   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   -Followed by Dr. Wendee Beavers in [place]  Stage IV Hodgkin's disease -Diagnosed at age 62.  Status post chemotherapy and mantle field radiotherapy  GERD -C/w Dexilant substitution   HLD,stable -C/w Home Rosuvastatin 10 mg po daily   Post-Surgical Hypothyroidism.  -TSH suppressed but not accurate given prior pituitary resection.  -Follow free T4.  -Continue home synthroid at current dose of 112 mcg   History of CHF with reduced EF. -Last TTE 08/2018 showed normal systolic and diastolic function. BNP here unremarkable.  -Will continue home lasix of 20 mg po BID  -Strict I's/O's and Daily Weights  Normocytic Anemia  -Patient's Hb/Hct went from 12.1/39.1 -> 11.1/36.3 -Check Anemia Panel -Continue to Monitor for S/Sx for Bleeding -Repeat CBC in AM   DVT prophylaxis: Lovenox Held due to hemoptysis  Code Status: FULL CODE Family Communication: No family present at bedside  Disposition Plan: Remain Inpatient for  further workup and evaluation   Consultants:   Pulmonary  Infectious Diseases  Hand Surgery   Procedures:   None  Antimicrobials:  Anti-infectives (From admission, onward)   Start     Dose/Rate Route Frequency Ordered Stop   11/11/18 1400  clindamycin (CLEOCIN) IVPB 600 mg     600 mg 100 mL/hr over 30 Minutes Intravenous Every 8 hours 11/11/18 1014     11/10/18 1645  doxycycline (VIBRA-TABS) tablet 100 mg  Status:   Discontinued     100 mg Oral Every 12 hours 11/10/18 1641 11/11/18 1014   11/08/18 1000  vancomycin (VANCOCIN) IVPB 1000 mg/200 mL premix  Status:  Discontinued     1,000 mg 200 mL/hr over 60 Minutes Intravenous Every 12 hours 11/07/18 2115 11/09/18 1545   11/07/18 2130  vancomycin (VANCOCIN) IVPB 750 mg/150 ml premix     750 mg 150 mL/hr over 60 Minutes Intravenous  Once 11/07/18 2110 11/07/18 2310   11/07/18 2000  vancomycin (VANCOCIN) IVPB 1000 mg/200 mL premix     1,000 mg 200 mL/hr over 60 Minutes Intravenous  Once 11/07/18 1955 11/07/18 2117     Subjective: Seen and examined at bedside and states that her finger was looking worse to her today.  She was not feeling well.  Still felt puffy and had a lot of pitting edema.  No nausea or vomiting.  No lightheadedness or dizziness.  Did not feel well today.  No other concerns or complaints at this time.  Objective: Vitals:   11/12/18 1034 11/12/18 1338 11/12/18 1449 11/12/18 1619  BP: 114/77 (!) 142/95 111/75   Pulse: 86 90 94   Resp: _0 Temp: (!) 97.5 F (36.4 C)  98.2 F (36.8 C)   TempSrc: Oral  Oral   SpO2: 100% 100% 97% 96%  Weight:      Height:        Intake/Output Summary (Last 24 hours) at 11/12/2018 1801 Last data filed at 11/12/2018 1000 Gross per 24 hour  Intake 240 ml  Output 1 ml  Net 239 ml   Filed Weights   11/07/18 1635  Weight: 80 kg   Examination: Physical Exam:  Constitutional: WN/WD overweight Caucasian female NAD and appears calm and comfortable Eyes: Lids and conjunctivae normal, sclerae anicteric  ENMT: External Ears, Nose appear normal. Grossly normal hearing.  Neck: Appears normal, supple, no cervical masses, normal ROM, no appreciable thyromegaly; no JVD Respiratory: Diminished to auscultation bilaterally, no wheezing, rales, rhonchi or crackles. Normal respiratory effort and patient is not tachypenic. No accessory muscle use.  Cardiovascular: RRR, no murmurs / rubs / gallops. S1 and  S2 auscultated. Bilateral 2-3+ Upper extremity edema and Trace LE edema Abdomen: Soft, non-tender, Distended. No masses palpated. No appreciable hepatosplenomegaly. Bowel sounds positive x4.  GU: Deferred. Musculoskeletal: No clubbing / cyanosis of digits/nails. No joint deformity upper and lower extremities.  Skin: Has blistering and eschar on Right middle finger and a few blisters on Right arm and one on the left wrist. No induration; Warm and dry.  Neurologic: CN 2-12 grossly intact with no focal deficits.  Romberg sign and cerebellar reflexes not assessed.  Psychiatric: Normal judgment and insight. Alert and oriented x 3. Anxious mood and appropriate affect.   Data Reviewed: I have personally reviewed following labs and imaging studies  CBC: Recent Labs  Lab 11/07/18 1806 11/09/18 1447 11/10/18 0937 11/12/18 0922  WBC 6.1 7.2 6.9 7.0  NEUTROABS 3.6  --   --  4.3  HGB 12.1 11.5* 10.8* 11.1*  HCT 39.1 36.3 35.7* 36.3  MCV 94.7 92.1 93.7 92.6  PLT 263 245 249 338   Basic Metabolic Panel: Recent Labs  Lab 11/07/18 1806 11/08/18 0330 11/12/18 0922  NA 140 139 140  K 3.7 4.4 3.5  CL 112* 108 104  CO2 21* 21* 26  GLUCOSE 124* 105* 123*  BUN _0 CREATININE 0.86 0.94 1.04*  CALCIUM 8.4* 9.0 8.6*  MG  --   --  2.0  PHOS  --   --  4.3   GFR: Estimated Creatinine Clearance: 69.9 mL/min (A) (by C-G formula based on SCr of 1.04 mg/dL (H)). Liver Function Tests: Recent Labs  Lab 11/12/18 0922  AST 30  ALT 32  ALKPHOS 39  BILITOT 0.3  PROT 6.0*  ALBUMIN 3.2*   No results for input(s): LIPASE, AMYLASE in the last 168 hours. No results for input(s): AMMONIA in the last 168 hours. Coagulation Profile: No results for input(s): INR, PROTIME in the last 168 hours. Cardiac Enzymes: No results for input(s): CKTOTAL, CKMB, CKMBINDEX, TROPONINI in the last 168 hours. BNP (last 3 results) No results for input(s): PROBNP in the last 8760 hours. HbA1C: No results for  input(s): HGBA1C in the last 72 hours. CBG: Recent Labs  Lab 11/12/18 1216  GLUCAP 94   Lipid Profile: No results for input(s): CHOL, HDL, LDLCALC, TRIG, CHOLHDL, LDLDIRECT in the last 72 hours. Thyroid Function Tests: No results for input(s): TSH, T4TOTAL, FREET4, T3FREE, THYROIDAB in the last 72 hours. Anemia Panel: No results for input(s): VITAMINB12, FOLATE, FERRITIN, TIBC, IRON, RETICCTPCT in the last 72 hours. Sepsis Labs: Recent Labs  Lab 11/07/18 1806  LATICACIDVEN 1.3    Recent Results (from the past 240 hour(s))  Expectorated sputum assessment w rflx to resp cult     Status: None   Collection Time: 11/11/18  3:21 PM  Result Value Ref Range Status   Specimen Description EXPECTORATED SPUTUM  Final   Special Requests NONE  Final   Sputum evaluation   Final    Sputum specimen not acceptable for testing.  Please recollect.   RESULT CALLED TO, READ BACK BY AND VERIFIED WITH: R RAKHIMOVA RN 2255 11/11/18 A BROWNING Performed at [facility], [place] [address], [place], [place] [address]    Report Status 11/11/2018 FINAL  Final  Expectorated sputum assessment w rflx to resp cult     Status: None   Collection Time: 11/12/18  3:31 AM  Result Value Ref Range Status   Specimen Description EXPECTORATED SPUTUM  Final   Special Requests NONE  Final   Sputum evaluation   Final    Sputum specimen not acceptable for testing.  Please recollect.   RESULT CALLED TO, READ BACK BY AND VERIFIED WITH: RALCHIMOVA RN 11/12/18 0605 JDW Performed at [facility] [address] [place], [place] [address]    Report Status 11/12/2018 FINAL  Final    Radiology Studies: No results found.  Scheduled Meds: . budesonide  0.5 mg Inhalation BID  . clonazePAM  1 mg Oral TID  . escitalopram  20 mg Oral Daily  . estradiol  2 mg Oral Daily  . fluticasone furoate-vilanterol  1 puff Inhalation Daily  . furosemide  20 mg Oral BID  . hydrocortisone  10 mg Oral QPM  . hydrocortisone  20 mg  Oral q morning - 10a  . levothyroxine  112 mcg Oral QAC breakfast  . metoprolol  succinate  37.5 mg Oral Daily  . potassium citrate  10 mEq Oral Daily  . rosuvastatin  10 mg Oral Daily  . sodium chloride flush  10-40 mL Intracatheter Q12H  . sodium chloride flush  3 mL Intravenous Q12H  . topiramate  150 mg Oral QHS   Continuous Infusions: . sodium chloride 1,000 mL (11/10/18 0357)  . clindamycin (CLEOCIN) IV 600 mg (11/12/18 1427)    LOS: 5 days   Kerney Elbe, DO [facility] PAGER is on [place]  If 7PM-7AM, please contact night-coverage [email or web address] Password [email or web address] 11/12/2018, 6:01 PM

## 2018-11-12 NOTE — Plan of Care (Signed)
  Problem: Education: Goal: Knowledge of General Education information will improve Description Including pain rating scale, medication(s)/side effects and non-pharmacologic comfort measures Outcome: Progressing   Problem: Nutrition: Goal: Adequate nutrition will be maintained Outcome: Progressing   Problem: Pain Managment: Goal: General experience of comfort will improve Outcome: Progressing

## 2018-11-13 ENCOUNTER — Inpatient Hospital Stay (HOSPITAL_COMMUNITY): Payer: BLUE CROSS/BLUE SHIELD

## 2018-11-13 DIAGNOSIS — Z86718 Personal history of other venous thrombosis and embolism: Secondary | ICD-10-CM

## 2018-11-13 DIAGNOSIS — Z888 Allergy status to other drugs, medicaments and biological substances status: Secondary | ICD-10-CM

## 2018-11-13 DIAGNOSIS — E2740 Unspecified adrenocortical insufficiency: Secondary | ICD-10-CM

## 2018-11-13 DIAGNOSIS — Z85850 Personal history of malignant neoplasm of thyroid: Secondary | ICD-10-CM

## 2018-11-13 DIAGNOSIS — Z885 Allergy status to narcotic agent status: Secondary | ICD-10-CM

## 2018-11-13 DIAGNOSIS — L988 Other specified disorders of the skin and subcutaneous tissue: Secondary | ICD-10-CM

## 2018-11-13 DIAGNOSIS — Z853 Personal history of malignant neoplasm of breast: Secondary | ICD-10-CM

## 2018-11-13 DIAGNOSIS — J9611 Chronic respiratory failure with hypoxia: Secondary | ICD-10-CM

## 2018-11-13 DIAGNOSIS — Z8679 Personal history of other diseases of the circulatory system: Secondary | ICD-10-CM

## 2018-11-13 DIAGNOSIS — Z91048 Other nonmedicinal substance allergy status: Secondary | ICD-10-CM

## 2018-11-13 DIAGNOSIS — Z95828 Presence of other vascular implants and grafts: Secondary | ICD-10-CM

## 2018-11-13 DIAGNOSIS — Z9221 Personal history of antineoplastic chemotherapy: Secondary | ICD-10-CM

## 2018-11-13 DIAGNOSIS — I50810 Right heart failure, unspecified: Secondary | ICD-10-CM

## 2018-11-13 DIAGNOSIS — R0602 Shortness of breath: Secondary | ICD-10-CM

## 2018-11-13 DIAGNOSIS — Z8541 Personal history of malignant neoplasm of cervix uteri: Secondary | ICD-10-CM

## 2018-11-13 DIAGNOSIS — Z9013 Acquired absence of bilateral breasts and nipples: Secondary | ICD-10-CM

## 2018-11-13 DIAGNOSIS — R042 Hemoptysis: Secondary | ICD-10-CM

## 2018-11-13 DIAGNOSIS — Z9882 Breast implant status: Secondary | ICD-10-CM

## 2018-11-13 DIAGNOSIS — Z8572 Personal history of non-Hodgkin lymphomas: Secondary | ICD-10-CM

## 2018-11-13 DIAGNOSIS — Z8639 Personal history of other endocrine, nutritional and metabolic disease: Secondary | ICD-10-CM

## 2018-11-13 LAB — CBC WITH DIFFERENTIAL/PLATELET
Abs Immature Granulocytes: 0.05 10*3/uL (ref 0.00–0.07)
BASOS PCT: 0 %
Basophils Absolute: 0.1 10*3/uL (ref 0.0–0.1)
Eosinophils Absolute: 0.1 10*3/uL (ref 0.0–0.5)
Eosinophils Relative: 1 %
HCT: 36.5 % (ref 36.0–46.0)
Hemoglobin: 11.2 g/dL — ABNORMAL LOW (ref 12.0–15.0)
Immature Granulocytes: 0 %
Lymphocytes Relative: 13 %
Lymphs Abs: 1.8 10*3/uL (ref 0.7–4.0)
MCH: 28.1 pg (ref 26.0–34.0)
MCHC: 30.7 g/dL (ref 30.0–36.0)
MCV: 91.7 fL (ref 80.0–100.0)
Monocytes Absolute: 1.3 10*3/uL — ABNORMAL HIGH (ref 0.1–1.0)
Monocytes Relative: 9 %
Neutro Abs: 10.8 10*3/uL — ABNORMAL HIGH (ref 1.7–7.7)
Neutrophils Relative %: 77 %
PLATELETS: 268 10*3/uL (ref 150–400)
RBC: 3.98 MIL/uL (ref 3.87–5.11)
RDW: 12.9 % (ref 11.5–15.5)
WBC: 14 10*3/uL — ABNORMAL HIGH (ref 4.0–10.5)
nRBC: 0 % (ref 0.0–0.2)

## 2018-11-13 LAB — COMPREHENSIVE METABOLIC PANEL
ALT: 30 U/L (ref 0–44)
AST: 27 U/L (ref 15–41)
Albumin: 3.3 g/dL — ABNORMAL LOW (ref 3.5–5.0)
Alkaline Phosphatase: 44 U/L (ref 38–126)
Anion gap: 10 (ref 5–15)
BUN: 13 mg/dL (ref 6–20)
CO2: 24 mmol/L (ref 22–32)
CREATININE: 1.03 mg/dL — AB (ref 0.44–1.00)
Calcium: 8.9 mg/dL (ref 8.9–10.3)
Chloride: 101 mmol/L (ref 98–111)
GFR calc non Af Amer: >60 mL/min (ref >60)
Glucose, Bld: 130 mg/dL — ABNORMAL HIGH (ref 70–99)
Potassium: 3.6 mmol/L (ref 3.5–5.1)
Sodium: 135 mmol/L (ref 135–145)
Total Bilirubin: 0.6 mg/dL (ref 0.3–1.2)
Total Protein: 6.5 g/dL (ref 6.5–8.1)

## 2018-11-13 LAB — URINALYSIS, ROUTINE W REFLEX MICROSCOPIC
Bilirubin Urine: NEGATIVE
Glucose, UA: NEGATIVE mg/dL
Hgb urine dipstick: NEGATIVE
Ketones, ur: NEGATIVE mg/dL
Leukocytes,Ua: NEGATIVE
Nitrite: NEGATIVE
Protein, ur: NEGATIVE mg/dL
Specific Gravity, Urine: 1.017 (ref 1.005–1.030)
pH: 8 (ref 5.0–8.0)

## 2018-11-13 LAB — MAGNESIUM: Magnesium: 1.8 mg/dL (ref 1.7–2.4)

## 2018-11-13 LAB — RAPID URINE DRUG SCREEN, HOSP PERFORMED
Amphetamines: NOT DETECTED
Barbiturates: NOT DETECTED
Benzodiazepines: NOT DETECTED
Cocaine: NOT DETECTED
Opiates: POSITIVE — AB
Tetrahydrocannabinol: NOT DETECTED

## 2018-11-13 LAB — SEDIMENTATION RATE: Sed Rate: 18 mm/hr (ref 0–22)

## 2018-11-13 LAB — PHOSPHORUS: Phosphorus: 3 mg/dL (ref 2.5–4.6)

## 2018-11-13 LAB — C-REACTIVE PROTEIN: CRP: 1.3 mg/dL — ABNORMAL HIGH (ref <1.0)

## 2018-11-13 LAB — INFLUENZA PANEL BY PCR (TYPE A & B)
Influenza A By PCR: NEGATIVE
Influenza B By PCR: NEGATIVE

## 2018-11-13 LAB — GLUCOSE, CAPILLARY: Glucose-Capillary: 112 mg/dL — ABNORMAL HIGH (ref 70–99)

## 2018-11-13 MED ORDER — HYDROCORTISONE 10 MG PO TABS
10.0000 mg | ORAL_TABLET | Freq: Every day | ORAL | Status: DC
  Administered 2018-11-13 – 2018-11-15 (×3): 10 mg via ORAL
  Filled 2018-11-13 (×4): qty 1

## 2018-11-13 MED ORDER — FUROSEMIDE 40 MG PO TABS: 40.0000 mg | ORAL_TABLET | Freq: Two times a day (BID) | ORAL | Status: DC

## 2018-11-13 MED ORDER — CLONAZEPAM 1 MG PO TABS
1.0000 mg | ORAL_TABLET | Freq: Three times a day (TID) | ORAL | Status: DC
  Administered 2018-11-13 – 2018-11-20 (×18): 1 mg via ORAL
  Filled 2018-11-13 (×21): qty 1

## 2018-11-13 MED ORDER — HYDROCORTISONE 20 MG PO TABS
20.0000 mg | ORAL_TABLET | Freq: Every day | ORAL | Status: DC
  Administered 2018-11-14 – 2018-11-16 (×3): 20 mg via ORAL
  Filled 2018-11-13 (×4): qty 1

## 2018-11-13 MED ORDER — LIDOCAINE HCL 2 % IJ SOLN
5.0000 mL | INTRAMUSCULAR | Status: AC
  Filled 2018-11-13 (×2): qty 10

## 2018-11-13 MED ORDER — LIDOCAINE HCL (PF) 1 % IJ SOLN
INTRAMUSCULAR | Status: AC
  Administered 2018-11-13: 15:00:00
  Filled 2018-11-13: qty 5

## 2018-11-13 MED ORDER — ACETAMINOPHEN 325 MG PO TABS
650.0000 mg | ORAL_TABLET | Freq: Four times a day (QID) | ORAL | Status: DC | PRN
  Administered 2018-11-13 – 2018-11-14 (×3): 650 mg via ORAL
  Filled 2018-11-13 (×3): qty 2

## 2018-11-13 MED ORDER — HYDROCORTISONE 20 MG PO TABS
20.0000 mg | ORAL_TABLET | Freq: Every day | ORAL | Status: DC
  Filled 2018-11-13: qty 1

## 2018-11-13 MED ORDER — FUROSEMIDE 40 MG PO TABS
40.0000 mg | ORAL_TABLET | Freq: Two times a day (BID) | ORAL | Status: DC
  Administered 2018-11-13 – 2018-11-20 (×13): 40 mg via ORAL
  Filled 2018-11-13 (×14): qty 1

## 2018-11-13 NOTE — Consult Note (Signed)
Date of Admission:  11/07/2018          Reason for Consult: Multiple ulcerating lesions    Referring Provider: Dr. Alfredia Ferguson   Assessment:  1. Problems with bilateral upper extremity edema 20 years after axillary lymph node dissection 2. History of deep venous thrombosis in left upper extremity 3. Multiple ulcerations on arms and hands of unclear cause, suspect vasculitic process 4. Hemoptysis 5. History of breast cancer status post bilateral radical mastectomies and bilateral axillary lymph node dissection with placement of breast implants  6. History of Hodgkin's lymphoma status post chemotherapy 7. History of pituitary adenoma status post resection, on chronic corticosteroid therapy 8. Thyroid cancer status post resection 9. Chemotherapy-induced heart failure 10. History of Raynaud's syndrome  Plan:  1. WOULD GET BIOPSY of SKIN LESIONS 2. Autoimmune workup including ANCA, anti-phospholipid antibody, SSA/SSB  3. Agree with bronchoscopy from pulmonary to send potentially for transbronchial biopsies 4. Narrowed to cefazolin 5. Discussed w Dr. Amil Amen who may call with further recs on tests 6. Would consider hematology/oncology consult or at least discussion about possibility of paraneoplastic syndromes 7. Consider increasing corticosteroid dose or an empiric trial to see if her ulcerations and hemoptysis improve 8. We will also get FLU PCR in case the acute pulmonary symptoms are due to influenza.  Principal Problem:   Cellulitis of right finger Active Problems:   Hodgkin lymphoma ([place])   Breast cancer (HCC)   Thyroid cancer ([place])   Adrenal insufficiency (HCC)   Hypertension   Hyperlipidemia   Edema   Right heart failure (HCC)   Nephrolithiasis   Oxygen dependent   Migraine   Hemoptysis   Scheduled Meds: . budesonide  0.5 mg Inhalation BID  . clonazePAM  1 mg Oral TID  . escitalopram  20 mg Oral Daily  . estradiol  2 mg Oral Daily  . fluticasone  furoate-vilanterol  1 puff Inhalation Daily  . furosemide  40 mg Oral BID  . hydrocortisone  10 mg Oral QPC lunch  . hydrocortisone  20 mg Oral Q breakfast  . levothyroxine  112 mcg Oral QAC breakfast  . metoprolol succinate  37.5 mg Oral Daily  . potassium citrate  10 mEq Oral Daily  . rosuvastatin  10 mg Oral Daily  . sodium chloride flush  10-40 mL Intracatheter Q12H  . sodium chloride flush  3 mL Intravenous Q12H  . topiramate  150 mg Oral QHS   Continuous Infusions: . sodium chloride 1,000 mL (11/10/18 0357)  .  ceFAZolin (ANCEF) IV 1 g (11/13/18 0622)   PRN Meds:.sodium chloride, acetaminophen, cyclobenzaprine, diphenhydrAMINE, HYDROmorphone (DILAUDID) injection, sodium chloride flush, sodium chloride flush, sucralfate, traMADol, zolpidem  HPI: [name] is a 48 y.o. female with history of breast cancer status post bilateral radical mastectomies with actually lymph node dissection with breast implants, history of Hodgkin's lymphoma thyroid cancer cervical cancer chemotherapy-induced heart failure, chronic hypoxemic respiratory failure followed by pulmonary at [facility] who have performed extensive work-up extensive work-up.  She has begun of this fall to develop problems with upper extremity swelling and blisters initially worse on the left side and was seen in the emergency department at [facility] in August 25, 2018.  At that time she had an ultrasound which showed that you have had an occlusive thrombus within the mid peripheral left basilic vein but no deep venous thrombosis.  She is also started noticing ulcers on her hands and pressure points of her wrist braces  She  was seen by her oncologist afterwards.  At that time she been reporting migrainous headaches with a nausea and vomiting.  She had work-up for her upper extremity edema besides the thrombus that was discovered no other pathology was found, similarly she had Dopplers of her neck veins performed which  were negative.  Also CT angiogram done which was negative for pulmonary embolism other vascular pathology.    She was scheduled to be seen by rheumatologist at [facility] but is several months out from that visit.  There are also plans for her to be seen by a hematologist but I do not see that she was seen by 1.  In the interim she is continue to experience spontaneous blisters that typically heal on their own.  A few weeks ago she developed redness around 1 of the burst blisters and was placed on antibiotics in the form of Keflex and then clindamycin.  This then resolved.  But then a week ago several small blisters appeared on dorsal surface of her third right finger which progressed to dark ulceration with surrounding erythema.  She is experiencing numbness in that finger and now saying she has no feeling in it.  She also is experiencing pain that radiates out of her hand up into her arm.  On admission to [facility] on 7 February she denied fevers but was having some small amount of purulence with these ulcers.  She was admitted to [facility] and placed on Comycin and then narrowed to doxycycline.  She was also placed on high-dose hydrocortisone which was then abruptly dropped.  She then developed symptoms of "fatigue and feeling like I am been hit by a truck.  She was feet seen by Dr. Amedeo Plenty with Ortho hand surgery.  She has had an MRI of the wrist and hand which show no evidence of osteomyelitis.  Her antibiotics were changed to clindamycin and she then in the interim began to develop hemoptysis.  She is being evaluated by pulmonary for the hemoptysis.  In the meantime in the context of antibiotics and corticosteroids her erythema around her finger has improved.  She still has a fair amount of numbness and pain rating up the arm.  Her temperature went up to nearly 101 in the last 24 hours.  He had an ANA done here and a sed rate and C-reactive protein which were all with  normal.  ANCA and other tests are still pending.  Toxicology screen is negative except for opiates he received in the hospital for pain.  Certainly do not think there is an infectious disease driving her pathology of her upper extremity edema and these digital ulcerations.  She could have some superficial infection of them but I could frankly be managed with even topical antimicrobials.  We will continue antibiotics in the form of cefazolin for now.  I think obtaining a biopsy of the skin lesions is the most rational step that can potentially lead to a diagnosis of what is causing these lesions.  I also discussed the case with Dr. Leigh Aurora who may give further recs of the testing we have initiated above.  Ultimately it may benefit her to be transferred to [facility] but the present time she wants to remain here at [facility].   She has no history of exposure to anyone with tuberculosis.  She did have a bronchoscopy in 2018 where AFB cultures fungal cultures and bacterial cultures were all sent and were all negative.      Review  of Systems: Review of Systems  Constitutional: Positive for fever and malaise/fatigue. Negative for chills, diaphoresis and weight loss.  HENT: Negative for congestion, hearing loss, sore throat and tinnitus.   Eyes: Negative for blurred vision and double vision.  Respiratory: Positive for cough and hemoptysis. Negative for sputum production, shortness of breath and wheezing.   Cardiovascular: Positive for chest pain. Negative for palpitations and leg swelling.  Gastrointestinal: Positive for nausea. Negative for abdominal pain, blood in stool, constipation, diarrhea, heartburn, melena and vomiting.  Genitourinary: Negative for dysuria, flank pain and hematuria.  Musculoskeletal: Positive for myalgias. Negative for back pain, falls and joint pain.  Skin: Positive for rash. Negative for itching.  Neurological: Positive for tingling, sensory change and headaches.  Negative for dizziness, focal weakness, loss of consciousness and weakness.  Endo/Heme/Allergies: Does not bruise/bleed easily.  Psychiatric/Behavioral: Negative for depression, memory loss and suicidal ideas. The patient is not nervous/anxious.     Past Medical History:  Diagnosis Date  . Addison's disease ([place])   . Adrenal insufficiency ([place])   . Aortic stenosis   . Aortic stenosis   . Appendicitis 12/19/09  . Breast cancer ([place])    STATUS POST BILATERAL MASTECTOMY. STATUS POST RECONSTRUCTION. SHE HAD SILICONE BREAST IMPLANTS AND THE LEFT IMPLANT IS LEAKING SLIGHTLY  . Chest pain   . CHF with right heart failure ([place]) 04/17/2017  . GERD (gastroesophageal reflux disease)   . Hodgkin lymphoma (HCC)    STATUS POST MANTLE RADIATION  . Hypertension   . Osteoporosis   . Palpitations   . Raynaud phenomenon   . SVT (supraventricular tachycardia) ([place])   . Tachycardia   . Thyroid cancer ([place])    STATUS POST SURGICAL REMOVAL-CURRENT ON THYROID REPLACEMENT    Social History   Tobacco Use  . Smoking status: Never Smoker  . Smokeless tobacco: Never Used  Substance Use Topics  . Alcohol use: Yes    Comment: occassionally  . Drug use: No    Family History  Family history unknown: Yes   Allergies  Allergen Reactions  . Cymbalta [Duloxetine Hcl] Anxiety  . Hydromorphone Other (See Comments)    BP drop and heart rate drops  . Buprenorphine Hcl Hives  . Compazine     unknown  . Metoclopramide Other (See Comments)    contractures  . Morphine And Related Hives  . Na Ferric Gluc Cplx In Sucrose Other (See Comments)    unknown  . Ondansetron Rash    [place]  . Promethazine Hcl Hives  . Tegaderm Ag Mesh [Silver] Rash    Old formulation only, is able to tolerate new formulation    OBJECTIVE: Blood pressure 108/66, pulse (!) 103, temperature (!) 100.4 F (38 C), temperature source Oral, resp. rate 16, height _0  (1.651 m), weight 80 kg, SpO2 100 %.  Physical  Exam Constitutional:      General: She is not in acute distress.    Appearance: Normal appearance. She is well-developed. She is not ill-appearing or diaphoretic.  HENT:     Head: Normocephalic and atraumatic.     Right Ear: Hearing and external ear normal.     Left Ear: Hearing and external ear normal.     Nose: No nasal deformity or rhinorrhea.  Eyes:     General: No scleral icterus.    Conjunctiva/sclera: Conjunctivae normal.     Right eye: Right conjunctiva is not injected.     Left eye: Left conjunctiva is not injected.     Pupils:  Pupils are equal, round, and reactive to light.  Neck:     Musculoskeletal: Normal range of motion and neck supple.     Vascular: No JVD.  Cardiovascular:     Rate and Rhythm: Normal rate and regular rhythm.     Heart sounds: Normal heart sounds, S1 normal and S2 normal. No murmur. No friction rub. No gallop.   Pulmonary:     Effort: No respiratory distress.     Breath sounds: No wheezing, rhonchi or rales.  Abdominal:     General: Bowel sounds are normal. There is no distension.     Palpations: Abdomen is soft.     Tenderness: There is no abdominal tenderness.  Musculoskeletal: Normal range of motion.     Right shoulder: Normal.     Left shoulder: Normal.     Right hip: Normal.     Left hip: Normal.     Right knee: Normal.     Left knee: Normal.  Lymphadenopathy:     Head:     Right side of head: No submandibular, preauricular or posterior auricular adenopathy.     Left side of head: No submandibular, preauricular or posterior auricular adenopathy.     Cervical: No cervical adenopathy.     Right cervical: No superficial or deep cervical adenopathy.    Left cervical: No superficial or deep cervical adenopathy.  Skin:    General: Skin is warm and dry.     Coloration: Skin is not pale.     Findings: Rash present. No abrasion, bruising, ecchymosis, erythema or lesion.     Nails: There is no clubbing.   Neurological:     General: No focal  deficit present.     Mental Status: She is alert and oriented to person, place, and time.     Sensory: No sensory deficit.     Motor: No weakness.     Coordination: Coordination normal.     Deep Tendon Reflexes: Reflexes normal.  Psychiatric:        Attention and Perception: She is attentive.        Mood and Affect: Mood normal.        Speech: Speech normal.        Behavior: Behavior normal. Behavior is cooperative.        Thought Content: Thought content normal.        Judgment: Judgment normal.   Extremities with edema  Port-A-Cath is clean without evidence of infection  Skin lesions with vesicular appearance that she showed me from her phone then subsequent pictures in late January.        Skin today 11/13/2018:          Lab Results Lab Results  Component Value Date   WBC 14.0 (H) 11/13/2018   HGB 11.2 (L) 11/13/2018   HCT 36.5 11/13/2018   MCV 91.7 11/13/2018   PLT 268 11/13/2018    Lab Results  Component Value Date   CREATININE 1.03 (H) 11/13/2018   BUN 13 11/13/2018   NA 135 11/13/2018   K 3.6 11/13/2018   CL 101 11/13/2018   CO2 24 11/13/2018    Lab Results  Component Value Date   ALT 30 11/13/2018   AST 27 11/13/2018   ALKPHOS 44 11/13/2018   BILITOT 0.6 11/13/2018     Microbiology: Recent Results (from the past 240 hour(s))  Expectorated sputum assessment w rflx to resp cult     Status: None   Collection Time: 11/11/18  3:21 PM  Result Value Ref Range Status   Specimen Description EXPECTORATED SPUTUM  Final   Special Requests NONE  Final   Sputum evaluation   Final    Sputum specimen not acceptable for testing.  Please recollect.   RESULT CALLED TO, READ BACK BY AND VERIFIED WITH: R RAKHIMOVA RN 2255 11/11/18 A BROWNING Performed at [facility], [place] [address], [place], [place] [address]    Report Status 11/11/2018 FINAL  Final  Expectorated sputum assessment w rflx to resp cult     Status: None   Collection Time: 11/12/18   3:31 AM  Result Value Ref Range Status   Specimen Description EXPECTORATED SPUTUM  Final   Special Requests NONE  Final   Sputum evaluation   Final    Sputum specimen not acceptable for testing.  Please recollect.   RESULT CALLED TO, READ BACK BY AND VERIFIED WITH: RALCHIMOVA RN 11/12/18 0605 JDW Performed at [facility] [address] [place], [place] [address]    Report Status 11/12/2018 FINAL  Final    Alcide Evener, [facility] [phone] pager  11/13/2018, 1:15 PM

## 2018-11-13 NOTE — Procedures (Addendum)
Punch Biopsy Procedure Note  Pre-operative Diagnosis: Skin lesions  Post-operative Diagnosis: same  Indications: Patient admitted with upper extremities swelling and blistering. ID requested biopsy of skin lesions.   Anesthesia: 1% plain lidocaine  Procedure Details  The procedure, risks and complications have been discussed in detail (including, but not limited to infection, bleeding) with the patient, and the patient has signed consent to the procedure.  The skin was sterilely prepped and lesion was infiltrated with 1% lidocaine to anesthestized the area. The left forearm was then biopsied using a 12m punch. The same was then placed in formalin. Hematosis was achieved and dry dressing was placed.   Findings: Ulcerative skin lesion   EBL: Minimal  Drains: None  Condition: Tolerated procedure well and Stable  Complications: none.  MBarth KirksMaczis 3:47 PM 11/13/2018 [id number]

## 2018-11-13 NOTE — Progress Notes (Addendum)
NAME:  [name], MRN:  [id number], DOB:  1971/03/11, LOS: 6 ADMISSION DATE:  11/07/2018, CONSULTATION DATE:  11/12/18 REFERRING MD:  Teryl Lucy - [facility], CHIEF COMPLAINT:  Hemoptysis  Brief History   48 year old F admitted 2/7 for progressive BUE swelling who has exhibited 3x small volume hemoptysis over hospital course. 2/10 CTA negative for PE but revealed a pulmonary nodule. PCCM consulted for evaluation for need for possible bronchoscopy to evaluate hemoptysis.  History of present illness   48 yo F  With PMH HTN, R sided heart failure, ?chronic hypoxic respiratory failure on home 3-4LNC, osteoporosis and extensive oncologic PMH including Hodgkin's lymphona in remission, Breast cancer with multiple recurrences s/p bilatearal mastectomy now in remission, pituitary adenoma s/p resection, thyroid cancer s/p thyroidectomy with resultant hypothyroidism, ?cervical cancer s/p LEEP who presented 2/7 for progressive BUE swelling. Swelling began multiple months before presentation and intermittently had associated spontaneously occurring and resolving blisters. Several weeks ago, the patient was started on abx (Keflex to [place]) for apparent cellulitis of an open blister. 1 week prior to presentation the patient reports the onset of multiple small blisters on right 3rd finger, which developed surrounding erythema and dark ulceration at time of presentation to ED.  The patient denied recent illness, fever, chills, but endorsed recent abx treatment as above. The patient reported decreased sensation in affected finger with associated decreased ROM.  In the ED the patient was started on vancomycin, hand surgery was consulted.   Over course of hospitalization, patient has been followed by ortho and IM. Ortho does not feel patient is a surgical candidate. Hospital course has been complicated by small volume hemoptysis x3. CTA chest negative for PE, but small lung nodule noted. PCCM consulted for evaluation.   Patient  was recently hospitalized at [facility], and has several OP follow ups scheduled including anOP pulmonary appointment with Dr. Nancie Neas ([place]) in March, 2020.    Past Medical History  Addison's disease Aortic stenosis Appendicitis Breast cancer, s/p bilateral mastectomy CHF, right heart failure ?Chronic hypoxic respiratory failure GERD Hodgkin lymphoma s/p mantle radiation HTN Migraine Osteoporosis  Raynauds Thyroid cancer s/p thyroidectomy  Hypothyroidism  Cervical cancer s/p LEEP  Lung nodules    Significant Hospital Events   2/7> admitted 2/10> CTA negative for PE. Stable pulmonary nodules compared to prior outside hospital CTs  Consults:  Ortho PCCM  ID  Procedures:   Significant Diagnostic Tests:  XR R Hand 2/7> soft tissue swelling with edema of dorsal metacarpal. No subcutaneous gas. No acute osseous abnormality  CT with contrast 2/10> negative for PE MRI hand> suggestive of soft tissue cellulitis second middle fourth finger. No oseto.  CXR 2/13> new small L opacity   Micro Data:  2/12 sputum cx>>  FluA/B> negative 2/13 BCx >>    Antimicrobials:  Clindamycin 2/11 Ancef 2/12>>   Interim history/subjective:  Elevated temperature overnight TMax 100.4 Patient states she "feels like she got hit by a bus" and additionally states she "would feel much better with a bronch."  Objective   Blood pressure 108/66, pulse (!) 103, temperature (!) 100.4 F (38 C), temperature source Oral, resp. rate 16, height 5' 5" (1.651 m), weight 80 kg, SpO2 100 %.        Intake/Output Summary (Last 24 hours) at 11/13/2018 1257 Last data filed at 11/13/2018 0900 Gross per 24 hour  Intake 575 ml  Output -  Net 575 ml   Filed Weights   11/07/18 1635  Weight: 80 kg  Examination: General: adult female. Laying in bed, flushed, NAD  HENT: NCAT, patent nares, mmm, no evidence of bleeding in nares, no dried blood or frank blood in mouth or on lips.  Lungs: No [place] at  time of exam  Cardiovascular: RRR no r/g/m.  Abdomen: soft round ndnt, bowel sounds x4 Extremities: 2+ BUE pitting edema.  Neuro: AAOx4. Following commands. 5/5 BUE strength 5/5 BLE strength. Normal grip strength bilaterally.  Psych: Anxious mood and affect.    Resolved Hospital Problem list     Assessment & Plan:   Hemoptysis  -Pulmonary nodules stable from prior CTs  -- 09/04/2018 outside hospital CT/PET scan: multiple pulmonary nodules stable from 2017, no uptake on PET scan  -? Chronic respiratory failure on home 3-4LNC, followed by [facility]  -FluA/B negative -CXR 2/13 new L lung infiltrate. New elevated temperature and leukocytosis  P -Continue pulmicort  -Continue breo ellipta  -Continue 3-4LNC, titrate for goal >92% -would like ambulatory sats assessment, discussed with RN  -Continue holding lovenox  -Continue IS   -Bronch 2/13, 2pm -NPO at midnight   -Scheduled for OP appointment with [facility] in March, 2020   Best practice:  Diet: NPO Pain/Anxiety/Delirium protocol (if indicated): dilaudid, ultram  VAP protocol (if indicated): n/a DVT prophylaxis: lovenox GI prophylaxis: none Glucose control: monitor   Mobility: ad lib  Code Status: Full  Family Communication: parents at bedside  Disposition: continue current level of care   Labs   CBC: Recent Labs  Lab 11/07/18 1806 11/09/18 1447 11/10/18 0937 11/12/18 0922 11/13/18 0512  WBC 6.1 7.2 6.9 7.0 14.0*  NEUTROABS 3.6  --   --  4.3 10.8*  HGB 12.1 11.5* 10.8* 11.1* 11.2*  HCT 39.1 36.3 35.7* 36.3 36.5  MCV 94.7 92.1 93.7 92.6 91.7  PLT 263 245 249 258 933    Basic Metabolic Panel: Recent Labs  Lab 11/07/18 1806 11/08/18 0330 11/12/18 0922 11/13/18 0512  NA 140 139 140 135  K 3.7 4.4 3.5 3.6  CL 112* 108 104 101  CO2 21* 21* 26 24  GLUCOSE 124* 105* 123* 130*  BUN _0 CREATININE 0.86 0.94 1.04* 1.03*  CALCIUM 8.4* 9.0 8.6* 8.9  MG  --   --  2.0 1.8  PHOS  --   --  4.3 3.0    GFR: Estimated Creatinine Clearance: 70.6 mL/min (A) (by C-G formula based on SCr of 1.03 mg/dL (H)). Recent Labs  Lab 11/07/18 1806 11/09/18 1447 11/10/18 0937 11/12/18 0922 11/13/18 0512  WBC 6.1 7.2 6.9 7.0 14.0*  LATICACIDVEN 1.3  --   --   --   --     Liver Function Tests: Recent Labs  Lab 11/12/18 0922 11/13/18 0512  AST 30 27  ALT 32 30  ALKPHOS 39 44  BILITOT 0.3 0.6  PROT 6.0* 6.5  ALBUMIN 3.2* 3.3*   No results for input(s): LIPASE, AMYLASE in the last 168 hours. No results for input(s): AMMONIA in the last 168 hours.  ABG    Component Value Date/Time   PHART 7.296 (L) 04/02/2018 1343   PCO2ART 40.8 04/02/2018 1343   PO2ART 140.0 (H) 04/02/2018 1343   HCO3 19.9 (L) 04/02/2018 1343   HCO3 20.8 04/02/2018 1343   TCO2 21 (L) 04/02/2018 1343   TCO2 22 04/02/2018 1343   ACIDBASEDEF 6.0 (H) 04/02/2018 1343   ACIDBASEDEF 6.0 (H) 04/02/2018 1343   O2SAT 99.0 04/02/2018 1343   O2SAT 65.0 04/02/2018 1343  Coagulation Profile: No results for input(s): INR, PROTIME in the last 168 hours.  Cardiac Enzymes: No results for input(s): CKTOTAL, CKMB, CKMBINDEX, TROPONINI in the last 168 hours.  HbA1C: Hgb A1c MFr Bld  Date/Time Value Ref Range Status  03/18/2010 05:40 AM  <5.7 % Final   5.4 (NOTE)                                                                       According to the ADA Clinical Practice Recommendations for 2011, when HbA1c is used as a screening test:   >=6.5%   Diagnostic of Diabetes Mellitus           (if abnormal result  is confirmed)  5.7-6.4%   Increased risk of developing Diabetes Mellitus  References:Diagnosis and Classification of Diabetes Mellitus,Diabetes VKFM,4037,54(HKGOV 1):S62-S69 and Standards of Medical Care in         Diabetes - 2011,Diabetes PCHE,0352,48  (Suppl 1):S11-S61.    CBG: Recent Labs  Lab 11/12/18 1216 11/13/18 1116  GLUCAP 94 112*     Total face to face time : 65 minutes Greater than 50 % of this  encounter was spent counseling the patient about risks and benefits of bronchoscopy, medical management as outlined above.  Eliseo Gum MSN, AGACNP-BC [facility] 11/13/2018, 12:57 PM

## 2018-11-13 NOTE — Progress Notes (Signed)
   My partner Dr. Baxter Flattery is concerned about the possibility that this patient might have a false negative flu PCR.  Her neighbor DOES have influenza as well and was only diagnosed well into hospital stay due to the fact that her primary problem was a leg abscess.  Regardless the neighbor was not on droplet precautions initially  and there is concerned about spread nosocomially  IF patient is getting BRONCHOSCOPY TOMORROW  PLEASE SEND NEW PCR FROM [facility] FOR INFLUENZA

## 2018-11-13 NOTE — Progress Notes (Signed)
Pt temp this am was 100.25F . Tylenol was giver per MD order. Re-check temp in an hour 100.60F. Will continue to monitor.

## 2018-11-13 NOTE — Progress Notes (Signed)
[facility] Infusion Coordinator will follow pt with ID team to support home infusion pharmacy services for pt at DC if home IV ABX are ordered.  If patient discharges after hours, please call [phone].   Larry Sierras 11/13/2018, 12:27 PM

## 2018-11-13 NOTE — Consult Note (Addendum)
[name] June 24, 1971  [id number].    Requesting MD: Dr. Drucilla Schmidt Chief Complaint/Reason for Consult: Ulcerative Skin [place]  HPI:  [name] is a 48 y.o. female with a history of breast cancer status post bilateral radical mastectomies with lymph node dissection, Hodgkin's lymphoma, thyroid cancer, cervical cancer, chemotherapy-induced heart failure is admitted to [facility] on Feb 7th was admitted for ulcerative lesion of right third digit. This was initially purulent with some areas that she describes as necrotic. She notes that her finger has become numb. She was started on antibiotics on admission and has been followed by ID. She recently has been having new areas of lesions on her left hand, as well as areas of her forearm.  She recently has began having fevers.  We were asked to consult to perform a skin biopsy.  ROS: Review of Systems  Constitutional: Positive for chills and fever.  Respiratory: Positive for cough and hemoptysis.   Skin: Positive for rash.  Neurological: Positive for tingling.       Numbness    Family History  Family history unknown: Yes    Past Medical History:  Diagnosis Date  . Addison's disease ([place])   . Adrenal insufficiency ([place])   . Aortic stenosis   . Aortic stenosis   . Appendicitis 12/19/09  . Breast cancer ([place])    STATUS POST BILATERAL MASTECTOMY. STATUS POST RECONSTRUCTION. SHE HAD SILICONE BREAST IMPLANTS AND THE LEFT IMPLANT IS LEAKING SLIGHTLY  . Chest pain   . CHF with right heart failure ([place]) 04/17/2017  . GERD (gastroesophageal reflux disease)   . Hodgkin lymphoma ([facility])    STATUS POST MANTLE RADIATION  . Hypertension   . Osteoporosis   . Palpitations   . Raynaud phenomenon   . SVT (supraventricular tachycardia) ([place])   . Tachycardia   . Thyroid cancer ([place])    STATUS POST SURGICAL REMOVAL-CURRENT ON THYROID REPLACEMENT    Past Surgical History:  Procedure Laterality Date  . ABDOMINAL HYSTERECTOMY    .  APPENDECTOMY    . CARDIAC CATHETERIZATION  05/18/09   NORMAL CATH  . PITUITARY SURGERY    . RIGHT/LEFT HEART CATH AND CORONARY ANGIOGRAPHY N/A 04/02/2018   Procedure: RIGHT/LEFT HEART CATH AND CORONARY ANGIOGRAPHY;  Surgeon: Burnell Blanks, MD;  Location: [facility];  Service: Cardiovascular;  Laterality: N/A;    Social History:  reports that she has never smoked. She has never used smokeless tobacco. She reports current alcohol use. She reports that she does not use drugs.  Allergies:  Allergies  Allergen Reactions  . Cymbalta [Duloxetine Hcl] Anxiety  . Hydromorphone Other (See Comments)    BP drop and heart rate drops  . Buprenorphine Hcl Hives  . Compazine     unknown  . Metoclopramide Other (See Comments)    contractures  . Morphine And Related Hives  . Na Ferric Gluc Cplx In Sucrose Other (See Comments)    unknown  . Ondansetron Rash    [place]  . Promethazine Hcl Hives  . Tegaderm Ag Mesh [Silver] Rash    Old formulation only, is able to tolerate new formulation    Medications Prior to Admission  Medication Sig Dispense Refill  . albuterol (VENTOLIN HFA) 108 (90 Base) MCG/ACT inhaler Inhale 2 puffs into the lungs every 6 (six) hours as needed for wheezing or shortness of breath.    . Alum & Mag Hydroxide-Simeth (GI COCKTAIL) SUSP suspension Take 30 mLs daily as needed by  mouth for indigestion (BLEEDING ULCERS). Shake well.    . beclomethasone (QVAR) 80 MCG/ACT inhaler Inhale 2 puffs into the lungs 2 (two) times daily.    Marland Kitchen BREO ELLIPTA 200-25 MCG/INH AEPB INHALE 1 INHALATION INTO THE LUNGS ONCE DAILY.  11  . clonazePAM (KLONOPIN) 1 MG tablet Take 1 mg by mouth 3 (three) times daily.    Marland Kitchen dexlansoprazole (DEXILANT) 60 MG capsule Take 60 mg by mouth daily.      . Diclofenac Potassium (CAMBIA) 50 MG PACK Take 1 packet by mouth 3 (three) times daily as needed. AS NEEDED FOR HEADACHES    . diphenhydrAMINE (BENADRYL) 50 MG/ML injection Inject 25 mg into the vein  every 6 (six) hours as needed (nausea).    . escitalopram (LEXAPRO) 20 MG tablet Take 20 mg by mouth daily.   1  . estradiol (ESTRACE) 2 MG tablet Take 2 mg by mouth daily.  11  . Ferrous Sulfate Dried (SLOW RELEASE IRON) 45 MG TBCR Take 1 tablet at bedtime by mouth.    . furosemide (LASIX) 20 MG tablet Take 20 mg by mouth 2 (two) times daily.    . hydrocortisone (CORTEF) 10 MG tablet Take 5-10 mg by mouth 2 (two) times daily. Take 10 mg in the am and 5 mg in the pm    . levothyroxine (SYNTHROID, LEVOTHROID) 112 MCG tablet Take 112 mcg by mouth daily before breakfast.    . metoprolol succinate (TOPROL-XL) 25 MG 24 hr tablet Take 37.5 mg by mouth daily.    Marland Kitchen OVER THE COUNTER MEDICATION Take 500 mg by mouth daily. Tumeric cucurmin    . potassium citrate (UROCIT-K) 10 MEQ (1080 MG) SR tablet Take 10 mEq by mouth daily. Break pill in half to minimize discomfort/difficulty swallowing.    . rosuvastatin (CRESTOR) 10 MG tablet Take 10 mg by mouth daily.     . sucralfate (CARAFATE) 1 g tablet Take 1 g as needed by mouth (FOR ULCERS).    Marland Kitchen topiramate (TOPAMAX) 100 MG tablet Take 150 mg by mouth at bedtime.     . traMADol (ULTRAM) 50 MG tablet Take 50 mg by mouth daily as needed (migraine).     . zolpidem (AMBIEN CR) 12.5 MG CR tablet Take 12.5 mg by mouth at bedtime.      . potassium chloride (K-DUR) 10 MEQ tablet Take 1 tablet (10 mEq total) by mouth daily. 90 tablet 3     Physical Exam: Blood pressure 103/67, pulse (!) 106, temperature (!) 100.9 F (38.3 C), temperature source Oral, resp. rate 19, height _0  (1.651 m), weight 80 kg, SpO2 98 %. General: pleasant, WD, WN white female who is laying in bed in NAD HEENT: head is normocephalic, atraumatic.  Sclera are noninjected.  PERRL.  Ears and nose without any masses or lesions.  Mouth is pink and moist Heart: regular, rate, and rhythm.   Lungs: CTAB  Respiratory effort nonlabored MS: all 4 extremities are symmetrical Skin: Multiple areas of  ulcerative lesions noted, more specifically to the right dorsal 3rd digit, left proximal ulnar aspect of dorsal hand and b/l forearms.  Psych: A&Ox3 with an appropriate affect.  Results for orders placed or performed during the hospital encounter of 11/07/18 (from the past 48 hour(s))  Expectorated sputum assessment w rflx to resp cult     Status: None   Collection Time: 11/12/18  3:31 AM  Result Value Ref Range   Specimen Description EXPECTORATED SPUTUM    Special Requests NONE  Sputum evaluation      Sputum specimen not acceptable for testing.  Please recollect.   RESULT CALLED TO, READ BACK BY AND VERIFIED WITH: RALCHIMOVA RN 11/12/18 0605 JDW Performed at [facility] [address], [place], [place] [address]    Report Status 11/12/2018 FINAL   CBC with Differential/Platelet     Status: Abnormal   Collection Time: 11/12/18  9:22 AM  Result Value Ref Range   WBC 7.0 4.0 - 10.5 K/uL   RBC 3.92 3.87 - 5.11 MIL/uL   Hemoglobin 11.1 (L) 12.0 - 15.0 g/dL   HCT 36.3 36.0 - 46.0 %   MCV 92.6 80.0 - 100.0 fL   MCH 28.3 26.0 - 34.0 pg   MCHC 30.6 30.0 - 36.0 g/dL   RDW 12.9 11.5 - 15.5 %   Platelets 258 150 - 400 K/uL   nRBC 0.0 0.0 - 0.2 %   Neutrophils Relative % 62 %   Neutro Abs 4.3 1.7 - 7.7 K/uL   Lymphocytes Relative 25 %   Lymphs Abs 1.8 0.7 - 4.0 K/uL   Monocytes Relative 9 %   Monocytes Absolute 0.7 0.1 - 1.0 K/uL   Eosinophils Relative 3 %   Eosinophils Absolute 0.2 0.0 - 0.5 K/uL   Basophils Relative 1 %   Basophils Absolute 0.1 0.0 - 0.1 K/uL   Immature Granulocytes 0 %   Abs Immature Granulocytes 0.02 0.00 - 0.07 K/uL    Comment: Performed at [facility] [address], [place], [place] [address]  Comprehensive metabolic panel     Status: Abnormal   Collection Time: 11/12/18  9:22 AM  Result Value Ref Range   Sodium 140 135 - 145 mmol/L   Potassium 3.5 3.5 - 5.1 mmol/L   Chloride 104 98 - 111 mmol/L   CO2 26 22 - 32 mmol/L   Glucose, Bld 123  (H) 70 - 99 mg/dL   BUN 15 6 - 20 mg/dL   Creatinine, Ser 1.04 (H) 0.44 - 1.00 mg/dL   Calcium 8.6 (L) 8.9 - 10.3 mg/dL   Total Protein 6.0 (L) 6.5 - 8.1 g/dL   Albumin 3.2 (L) 3.5 - 5.0 g/dL   AST 30 15 - 41 U/L   ALT 32 0 - 44 U/L   Alkaline Phosphatase 39 38 - 126 U/L   Total Bilirubin 0.3 0.3 - 1.2 mg/dL   GFR calc non Af Amer >60 >60 mL/min   GFR calc Af Amer >60 >60 mL/min   Anion gap 10 5 - 15    Comment: Performed at [facility], [place] [address] [place], [place] [address]  Magnesium     Status: None   Collection Time: 11/12/18  9:22 AM  Result Value Ref Range   Magnesium 2.0 1.7 - 2.4 mg/dL    Comment: Performed at [facility] [address], [place], [place] [address]  Phosphorus     Status: None   Collection Time: 11/12/18  9:22 AM  Result Value Ref Range   Phosphorus 4.3 2.5 - 4.6 mg/dL    Comment: Performed at [facility] [address], [place], [place] [address]  Glucose, capillary     Status: None   Collection Time: 11/12/18 12:16 PM  Result Value Ref Range   Glucose-Capillary 94 70 - 99 mg/dL  Sedimentation rate     Status: None   Collection Time: 11/13/18 12:05 AM  Result Value Ref Range  Sed Rate 18 0 - 22 mm/hr    Comment: Performed at [facility], [place] [address] [place], [place] [address]  C-reactive protein     Status: Abnormal   Collection Time: 11/13/18 12:05 AM  Result Value Ref Range   CRP 1.3 (H) <1.0 mg/dL    Comment: Performed at [facility] [address], [place], [place] [address]  CBC with Differential/Platelet     Status: Abnormal   Collection Time: 11/13/18  5:12 AM  Result Value Ref Range   WBC 14.0 (H) 4.0 - 10.5 K/uL   RBC 3.98 3.87 - 5.11 MIL/uL   Hemoglobin 11.2 (L) 12.0 - 15.0 g/dL   HCT 36.5 36.0 - 46.0 %   MCV 91.7 80.0 - 100.0 fL   MCH 28.1 26.0 - 34.0 pg   MCHC 30.7 30.0 - 36.0 g/dL   RDW 12.9 11.5 - 15.5 %   Platelets 268 150 - 400 K/uL   nRBC 0.0 0.0 - 0.2 %   Neutrophils Relative  % 77 %   Neutro Abs 10.8 (H) 1.7 - 7.7 K/uL   Lymphocytes Relative 13 %   Lymphs Abs 1.8 0.7 - 4.0 K/uL   Monocytes Relative 9 %   Monocytes Absolute 1.3 (H) 0.1 - 1.0 K/uL   Eosinophils Relative 1 %   Eosinophils Absolute 0.1 0.0 - 0.5 K/uL   Basophils Relative 0 %   Basophils Absolute 0.1 0.0 - 0.1 K/uL   Immature Granulocytes 0 %   Abs Immature Granulocytes 0.05 0.00 - 0.07 K/uL    Comment: Performed at [facility], [place] [address], [place], [place] [address]  Comprehensive metabolic panel     Status: Abnormal   Collection Time: 11/13/18  5:12 AM  Result Value Ref Range   Sodium 135 135 - 145 mmol/L   Potassium 3.6 3.5 - 5.1 mmol/L   Chloride 101 98 - 111 mmol/L   CO2 24 22 - 32 mmol/L   Glucose, Bld 130 (H) 70 - 99 mg/dL   BUN 13 6 - 20 mg/dL   Creatinine, Ser 1.03 (H) 0.44 - 1.00 mg/dL   Calcium 8.9 8.9 - 10.3 mg/dL   Total Protein 6.5 6.5 - 8.1 g/dL   Albumin 3.3 (L) 3.5 - 5.0 g/dL   AST 27 15 - 41 U/L   ALT 30 0 - 44 U/L   Alkaline Phosphatase 44 38 - 126 U/L   Total Bilirubin 0.6 0.3 - 1.2 mg/dL   GFR calc non Af Amer >60 >60 mL/min   GFR calc Af Amer >60 >60 mL/min   Anion gap 10 5 - 15    Comment: Performed at [facility] [address], [place], [place] [address]  Magnesium     Status: None   Collection Time: 11/13/18  5:12 AM  Result Value Ref Range   Magnesium 1.8 1.7 - 2.4 mg/dL    Comment: Performed at [facility] [address], [place], [place] [address]  Phosphorus     Status: None   Collection Time: 11/13/18  5:12 AM  Result Value Ref Range   Phosphorus 3.0 2.5 - 4.6 mg/dL    Comment: Performed at [facility] [address] [place], [place] [address]  Urine rapid drug screen (hosp performed)     Status: Abnormal   Collection Time: 11/13/18  5:20 AM  Result Value Ref Range   Opiates POSITIVE (A) NONE DETECTED  Cocaine NONE DETECTED NONE DETECTED   Benzodiazepines NONE DETECTED NONE DETECTED   Amphetamines NONE  DETECTED NONE DETECTED   Tetrahydrocannabinol NONE DETECTED NONE DETECTED   Barbiturates NONE DETECTED NONE DETECTED    Comment: (NOTE) DRUG SCREEN FOR MEDICAL PURPOSES ONLY.  IF CONFIRMATION IS NEEDED FOR ANY PURPOSE, NOTIFY LAB WITHIN 5 DAYS. LOWEST DETECTABLE LIMITS FOR URINE DRUG SCREEN Drug Class                     Cutoff (ng/mL) Amphetamine and metabolites    1000 Barbiturate and metabolites    200 Benzodiazepine                 644 Tricyclics and metabolites     300 Opiates and metabolites        300 Cocaine and metabolites        300 THC                            50 Performed at [facility], [place] [address] [place], [place] [address]   Urinalysis, Routine w reflex microscopic     Status: Abnormal   Collection Time: 11/13/18  5:20 AM  Result Value Ref Range   Color, Urine YELLOW YELLOW   APPearance CLOUDY (A) CLEAR   Specific Gravity, Urine 1.017 1.005 - 1.030   pH 8.0 5.0 - 8.0   Glucose, UA NEGATIVE NEGATIVE mg/dL   Hgb urine dipstick NEGATIVE NEGATIVE   Bilirubin Urine NEGATIVE NEGATIVE   Ketones, ur NEGATIVE NEGATIVE mg/dL   Protein, ur NEGATIVE NEGATIVE mg/dL   Nitrite NEGATIVE NEGATIVE   Leukocytes,Ua NEGATIVE NEGATIVE    Comment: Performed at [facility] [address] [place], [place] [address]  Influenza panel by PCR (type A & B)     Status: None   Collection Time: 11/13/18 10:41 AM  Result Value Ref Range   Influenza A By PCR NEGATIVE NEGATIVE   Influenza B By PCR NEGATIVE NEGATIVE    Comment: (NOTE) The Xpert Xpress Flu assay is intended as an aid in the diagnosis of  influenza and should not be used as a sole basis for treatment.  This  assay is FDA approved for nasopharyngeal swab specimens only. Nasal  washings and aspirates are unacceptable for Xpert Xpress Flu testing. Performed at [facility], [place] [address] [place], [place] [address]   Glucose, capillary     Status: Abnormal   Collection Time: 11/13/18 11:16 AM    Result Value Ref Range   Glucose-Capillary 112 (H) 70 - 99 mg/dL   Mr Hand Right W Wo Contrast  Result Date: 11/12/2018 CLINICAL DATA:  48 year old female with 3 month history of bilateral hand swelling with intermittent ulcerations. Patient developed blisters to the right hand and developed an ulcer with worsening erythema and pain of the right middle finger. EXAM: MRI OF THE RIGHT HAND WITHOUT AND WITH CONTRAST TECHNIQUE: Multiplanar, multisequence MR imaging of the right hand was performed before and after the administration of intravenous contrast. CONTRAST:  8 mL Gadavist IV COMPARISON:  11/07/2018 hand radiographs FINDINGS: Bones/Joint/Cartilage No marrow signal abnormality of the included hand and wrist. No fracture, joint dislocation nor findings of osteomyelitis. No significant erosive change nor suspicious osseous lesions. Ligaments Intact. No thickening of the transverse ligament of the carpal tunnel. Muscles and Tendons Intact flexor and extensor tendons without evidence of tenosynovitis. Unremarkable carpal tunnel. Median nerve is  unremarkable. No evidence of myositis or pyomyositis. Soft tissues Nonspecific subcutaneous soft tissue edema of the included dorsum of the hand consistent with cellulitis with extension of soft tissue edema involving the second through fourth digits, greatest along the middle finger. Drainable fluid collections are identified. IMPRESSION: 1. Findings in keeping cellulitis of the hand and in particular the second through fourth digits and more so involving the middle finger. No drainable fluid collections are identified to suggest abscess. 2. No evidence of osteomyelitis. Electronically Signed   By: Ashley Royalty M.D.   On: 11/12/2018 23:19   Dg Chest Port 1 View  Result Date: 11/13/2018 CLINICAL DATA:  Shortness of breath EXAM: PORTABLE CHEST 1 VIEW COMPARISON:  Chest CT from 3 days ago FINDINGS: Small focus of airspace disease in the left lung that was not seen  previously. Normal heart size and mediastinal contours. Right port in unremarkable position. IMPRESSION: Small airspace opacity on the left that was not seen on chest CT 3 days ago and consistent with an inflammatory process, presumably pneumonia. Electronically Signed   By: Monte Fantasia M.D.   On: 11/13/2018 09:35      Assessment/Plan Ulcerative Skin Lesions - Punch Biopsy performed per ID - Dry dressing in place. Okay to change tomorrow, and shower with wound open.   Jillyn Ledger, [facility] 11/13/2018, 3:44 PM Pager: [phone]

## 2018-11-13 NOTE — Plan of Care (Signed)
  Problem: Education: Goal: Knowledge of General Education information will improve Description: Including pain rating scale, medication(s)/side effects and non-pharmacologic comfort measures Outcome: Progressing   Problem: Activity: Goal: Risk for activity intolerance will decrease Outcome: Progressing   

## 2018-11-13 NOTE — Progress Notes (Signed)
PROGRESS NOTE    [name]  KGU:[id number] DOB: 1971/01/17 DOA: 11/07/2018 PCP: Su Grand, MD   Brief Narrative:  [name] is a 48 y.o. 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   She was first evaluated for blisters which developed into ulcerations of both hands by her PCP 08/2018.  At that time she was treated with one-time dose of Rocephin in the office followed by Keflex.  Due to subjective fevers at that time she was evaluated in the ED and discharged on clindamycin x 11days, left upper extremity ultrasound at that time showed associated superficial occlusive thrombus of left basilic vein with no DVT. ED evaluation at that time also included CTA negative for PE  Since then ambulatory referral to Rheumatology was placed by her primary oncologist. She has had persistent swelling in both hands without any new ulcerations until a few weeks ago with this current presentation.  A week ago noticed right middle finger blisters in middle of finger. It rsolved on its own and she covered with neosporin and dressed for a few days then it turned black with some purulent drainage. Then started noticing redness, numbness x 2 days. Pain extending in to hand during that time as  well.  E visit yesterday---who advised being seen by provider face to face to rule out systemic infection. Denies fevers, chills. Chronic nausea. No vomiting, abd pain, no history of trauma/injury to hand. No other ulcers. No joint pain or aches. No dysuria, no diarrhea/constipation  This a.m. she had 2 episodes of small-volume hemoptysis while coughing.  Reports some increased shortness of breath.  No fevers or chills.  Also shows new small lesions on left wrist and left forearm and right forearm..  **ID, Pulmonary and Hand Surgery were consulted and workup still pending. She is to undergo a Bronchoscopy on 2/14 at 2:00 pm. Flu PCR is Negative. ID consulted and recommended General Surgery to biopsy her skin ulcer on her arm and current feeling is that it is 2/2 to a Vasculitic Process. She spiked a temperature overnight and so Blood Cx were ordered but never drawn.   Assessment & Plan:   Principal Problem:   Cellulitis of right finger Active Problems:   Hodgkin lymphoma ([place]   Breast cancer (HCC)   Thyroid cancer (HCC)   Adrenal insufficiency (HCC)   Hypertension   Hyperlipidemia   Edema   Right heart failure (HCC)   Nephrolithiasis   Oxygen dependent   Migraine   Hemoptysis  Right third finger ulceration, with Cellulitis  Fever and new Leukocytosis and ? PNA -X-ray imaging shows involvement of subcutaneous tissue with no obvious involvement with bone.   -Dr. GAmedeo Plentywith surgery following no signs of abscess/compartment syndrome or lymphangitis.   -Given she has no sepsis physiology and no obvious worsening erythema she was transitioned to doxycycline on 2/10 but now has  new lesions on left wrist and forearm and right forearm (slight abrasions with some surrounding erythema).   -Discontinued Doxycycline and started Clindamycin since she has done well with this in the past (prior presentation at [facility] during November 2019)and closely monitor over next 24 hours to ensure no fevers or  worsening clinical status but discussed with ID Dr. Tommy Medal who recommended changing to Cefazolin and obtaining an MRI of the Hand -MRI of Right Hand ordered along with Inflammatory Markers and (ESR was 18 and CRP was 1.3) and along with ANCA titers, Rheumatoid Factor; ID added other Autoimmune studies including Anti-phospholidid Ab and SSA/SSB -Also obtaining UDS   -TTE negative for vegetation, no DVT in R arm, no clot in subclavian.   -Blood cultures ordered because she became febrile at 100.8 but patient refused them -WBC also elevated to 14,000 -Further Abx Recommendations per ID; Influenza Negative via PCR but ID feels as if this may be a False positive and ID recommends that if she goes for Bronchoscopy tomorrow to send a new PCR from BAL for Influenza  -Will get Strep A Cx  Small volume hemoptysis episodes -Becoming more frequent.  Occurring with cough.   -CT chest negative for any new masses, chronic nodule shown.   -Sending for culture to assess for potential infection given some diminished breath sounds in left bases.  -CXR showed Small airspace opacity on the left that was not seen on chest CT 3 days ago and consistent with an inflammatory process, presumably pneumonia. -Will Consult Pulmonary for further evaluation and recommendations and defer to them for further workup -Pulmonary planning on doing a Bronchoscopy sometime tomorrow  -May need Stress Dose Steroids for tomorrow's Bronchosocopy  Subacute history of blisters developing into ulcers and bilateral hand and arm swelling.   -Ongoing for 3 months.  Is unclear if this is related to some autoimmune process (has history of Raynaud's phenomenon).   -Increased Lasix to 40 mg BID  -CCP was obtained on 08/2018 at ED vist and was unremarkable.   -HIV and ANA negative here. Will obtain ANCA Titers, Rhuematoid Factor, ESR and CRP and also obtain a UDS; As above -Unclear family history as patient is adopted, does not have any other  systemic symptoms (no joint aches, no joint pain,). -CT chest was negative for any new metastatic lesions or any lymphadenopathy  -Dr. Tommy Medal reached out to Dr. Amil Amen of Rheumatology who may offer more insight -C/w IV Cefazolin for now -General Surgery consulted for Biopsy of skin ulceration -Follow up on studies ordered   -Will discuss with Hematology/Oncology given her previous Hx of Breast Cancer and Hodgkins Lymphoma about the possiblity of Paraneoplastic syndromes  -Has an outpatient rheumatology referral scheduled in March   Adrenal insufficiency secondary to pituitary resection for adenoma (2002, status post gamma knife surgery for regrowth of tumor in 2015).   -Discontinued IV hydrocortisone and transitioned to oral stress dosing 20 mg of hydrocortisone x3 days followed by reinitiation of her home regimen 10 mg in the a.m. 5 mg in p.m. but was not done so I have increased her Steroids back to 20 mg in AM and 10 mg after Lunch for a short term  -Continue Closely monitor over next 24 hours to ensure stability  Thrombosis of basilic vein (86/75/4492).   -Status post 14-day treatment Lovenox by oncologist in 08/2018 -Continue to Monitor   Chronic hypoxic respiratory failure, stable on home 3 L nasal cannula.   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   -Followed by [facility] pulmonology.  Continue home Breo Pulmicort added here.  Albuterol PRN -Pulmonary Folllowing and planning on doing Bronchoscopy   History of Aortic Stenosis.  -Documented in [facility]. Systolic murmur heard on exam. Last TTE at [facility] on 08/2018 showed no stenosis.  -No vegetation on TTE here, does have tricupsid valve structure/calcification moderate.   Chronic Nausea.   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   -Followed by Dr. Wendee Beavers in [place]  Stage IV Hodgkin's disease -Diagnosed at age 54.  Status post chemotherapy and mantle field radiotherapy  GERD -C/w Dexilant substitution   HLD,stable -C/w Home Rosuvastatin 10 mg po daily   Post-Surgical Hypothyroidism.  -TSH suppressed but not accurate given prior pituitary resection.  -Follow free T4.  -Continue home synthroid at current dose of 112 mcg   History of CHF with reduced EF. -Last TTE 08/2018 showed normal systolic and diastolic function. BNP here unremarkable.  -Continued home lasix of 20 mg po BID but now increased to 40 mg BID  -Strict I's/O's and Daily Weights; She is +4.194 Liters since admission; Weight's Not done  Normocytic Anemia  -Patient's Hb/Hct went from 12.1/39.1 -> 11.1/36.3 -> 11.2/36.5 -Check Anemia Panel -Continue to Monitor for S/Sx for Bleeding -Repeat CBC in AM   DVT prophylaxis: Lovenox Held due to hemoptysis  Code Status: FULL CODE Family Communication: No family present at bedside  Disposition Plan: Remain Inpatient for further workup and evaluation   Consultants:   Pulmonary  Infectious Diseases  Hand Surgery   General Surgery   Procedures:   None  Antimicrobials:  Anti-infectives (From admission, onward)   Start     Dose/Rate Route Frequency Ordered Stop   11/12/18 2200  ceFAZolin (ANCEF) IVPB 1 g/50 mL premix     1 g 100 mL/hr over 30 Minutes Intravenous Every 8 hours 11/12/18 1841     11/12/18 1930  ceFAZolin (ANCEF) IVPB 1 g/50 mL premix  Status:  Discontinued     1 g 100 mL/hr over 30 Minutes Intravenous Every 8 hours 11/12/18 1838 11/12/18 1841   11/11/18 1400  clindamycin (CLEOCIN) IVPB 600 mg  Status:  Discontinued     600 mg 100 mL/hr over 30 Minutes Intravenous Every 8 hours  11/11/18 1014 11/12/18 1814   11/10/18 1645  doxycycline (VIBRA-TABS) tablet 100 mg  Status:  Discontinued     100 mg Oral Every 12 hours 11/10/18 1641 11/11/18 1014   11/08/18 1000  vancomycin (VANCOCIN) IVPB 1000 mg/200 mL premix  Status:  Discontinued     1,000 mg 200 mL/hr over 60 Minutes Intravenous Every 12 hours 11/07/18 2115 11/09/18 1545   11/07/18 2130  vancomycin (VANCOCIN) IVPB 750 mg/150 ml premix     750 mg 150 mL/hr over 60 Minutes Intravenous  Once 11/07/18 2110 11/07/18 2310   11/07/18 2000  vancomycin (VANCOCIN) IVPB 1000 mg/200 mL premix     1,000 mg 200 mL/hr over 60 Minutes Intravenous  Once 11/07/18 1955 11/07/18 2117     Subjective: Seen and examined at bedside and that she felt worse and spiked a temperature overnight.  States that her "throat felt like razor blades."  No chest pain.  Trying to elevate her arms.  States the blister has not really changed much.  States that she feels fatigued and just wants to know what is going on.  No other concerns or complaints at this time and states the swelling in her arms is still significant.  Objective: Vitals:   11/13/18 0837 11/13/18 1404 11/13/18 1541 11/13/18 1749  BP:  103/67    Pulse:  (!) 106    Resp:  19    Temp:   (!) 100.9 F (38.3 C) 99 F (37.2 C)  TempSrc:   Oral Oral  SpO2: 100% 98% 96%   Weight:      Height:        Intake/Output Summary (Last 24 hours) at 11/13/2018 1827 Last data filed at 11/13/2018 1700 Gross per 24 hour  Intake 815 ml  Output -  Net 815 ml   Filed Weights   11/07/18 1635  Weight: 80 kg   Examination: Physical Exam:  Constitutional: Well-nourished, well-developed overweight Caucasian female currently no acute distress but does appear uncomfortable and slightly ill-appearing Eyes: Lids and conjunctive are normal.  Sclera anicteric ENMT: External ears and nose appear normal.  Grossly normal hearing Neck: Appears supple no JVD Respiratory: Slightly diminished auscultation  bilaterally but more so on the left lung base than the right lung base.  No appreciable accessory muscle use but she is wearing supplemental oxygen via nasal cannula Cardiovascular: Regular rate and rhythm.  No appreciable murmurs, rubs, gallops.  Has bilateral 2-3+ upper extremity edema and trace lower extremity edema Abdomen: Soft, nontender, distended.  Bowel sounds present GU: Deferred Musculoskeletal: No clubbing or cyanosis.  No joint fluid noted Skin: Has some blistering and shortened right middle finger and a few blisters on the right arm and one on the left wrist.  No induration.  Skin is warm and dry and she is significantly edematous in upper extremities Neurologic: Cranial nerves II through XII gross intact no appreciable focal deficits Psychiatric: Normal judgment and insight.  Appears a little bit anxious.  She is awake and alert  Data Reviewed: I have personally reviewed following labs and imaging studies  CBC: Recent Labs  Lab 11/07/18 1806 11/09/18 1447 11/10/18 0937 11/12/18 0922 11/13/18 0512  WBC 6.1 7.2 6.9 7.0 14.0*  NEUTROABS 3.6  --   --  4.3 10.8*  HGB 12.1 11.5* 10.8* 11.1* 11.2*  HCT 39.1 36.3 35.7* 36.3 36.5  MCV 94.7 92.1 93.7 92.6 91.7  PLT 263 245 249 258 960   Basic Metabolic Panel: Recent Labs  Lab 11/07/18 1806 11/08/18 0330 11/12/18 0922 11/13/18 0512  NA 140 139 140 135  K 3.7 4.4 3.5 3.6  CL 112* 108 104 101  CO2 21* 21* 26 24  GLUCOSE 124* 105* 123* 130*  BUN _0 CREATININE 0.86 0.94 1.04* 1.03*  CALCIUM 8.4* 9.0 8.6* 8.9  MG  --   --  2.0 1.8  PHOS  --   --  4.3 3.0   GFR: Estimated Creatinine Clearance: 70.6 mL/min (A) (by C-G formula based on SCr of 1.03 mg/dL (H)). Liver Function Tests: Recent Labs  Lab 11/12/18 0922 11/13/18 0512  AST 30 27  ALT 32 30  ALKPHOS 39 44  BILITOT 0.3 0.6  PROT 6.0* 6.5  ALBUMIN 3.2* 3.3*   No results for input(s): LIPASE, AMYLASE in the last 168 hours. No results for input(s):  AMMONIA in the last 168 hours. Coagulation Profile: No results for input(s): INR, PROTIME in the last 168 hours. Cardiac  Enzymes: No results for input(s): CKTOTAL, CKMB, CKMBINDEX, TROPONINI in the last 168 hours. BNP (last 3 results) No results for input(s): PROBNP in the last 8760 hours. HbA1C: No results for input(s): HGBA1C in the last 72 hours. CBG: Recent Labs  Lab 11/12/18 1216 11/13/18 1116  GLUCAP 94 112*   Lipid Profile: No results for input(s): CHOL, HDL, LDLCALC, TRIG, CHOLHDL, LDLDIRECT in the last 72 hours. Thyroid Function Tests: No results for input(s): TSH, T4TOTAL, FREET4, T3FREE, THYROIDAB in the last 72 hours. Anemia Panel: No results for input(s): VITAMINB12, FOLATE, FERRITIN, TIBC, IRON, RETICCTPCT in the last 72 hours. Sepsis Labs: Recent Labs  Lab 11/07/18 1806  LATICACIDVEN 1.3    Recent Results (from the past 240 hour(s))  Expectorated sputum assessment w rflx to resp cult     Status: None   Collection Time: 11/11/18  3:21 PM  Result Value Ref Range Status   Specimen Description EXPECTORATED SPUTUM  Final   Special Requests NONE  Final   Sputum evaluation   Final    Sputum specimen not acceptable for testing.  Please recollect.   RESULT CALLED TO, READ BACK BY AND VERIFIED WITH: R RAKHIMOVA RN 2255 11/11/18 A BROWNING Performed at [facility], [place] [address] [place], [place] [address]    Report Status 11/11/2018 FINAL  Final  Expectorated sputum assessment w rflx to resp cult     Status: None   Collection Time: 11/12/18  3:31 AM  Result Value Ref Range Status   Specimen Description EXPECTORATED SPUTUM  Final   Special Requests NONE  Final   Sputum evaluation   Final    Sputum specimen not acceptable for testing.  Please recollect.   RESULT CALLED TO, READ BACK BY AND VERIFIED WITH: RALCHIMOVA RN 11/12/18 0605 JDW Performed at [place] [address] [place], [place] [address]    Report Status 11/12/2018 FINAL  Final      Radiology Studies: Mr Hand Right W Wo Contrast  Result Date: 11/12/2018 CLINICAL DATA:  48 year old female with 3 month history of bilateral hand swelling with intermittent ulcerations. Patient developed blisters to the right hand and developed an ulcer with worsening erythema and pain of the right middle finger. EXAM: MRI OF THE RIGHT HAND WITHOUT AND WITH CONTRAST TECHNIQUE: Multiplanar, multisequence MR imaging of the right hand was performed before and after the administration of intravenous contrast. CONTRAST:  8 mL Gadavist IV COMPARISON:  11/07/2018 hand radiographs FINDINGS: Bones/Joint/Cartilage No marrow signal abnormality of the included hand and wrist. No fracture, joint dislocation nor findings of osteomyelitis. No significant erosive change nor suspicious osseous lesions. Ligaments Intact. No thickening of the transverse ligament of the carpal tunnel. Muscles and Tendons Intact flexor and extensor tendons without evidence of tenosynovitis. Unremarkable carpal tunnel. Median nerve is unremarkable. No evidence of myositis or pyomyositis. Soft tissues Nonspecific subcutaneous soft tissue edema of the included dorsum of the hand consistent with cellulitis with extension of soft tissue edema involving the second through fourth digits, greatest along the middle finger. Drainable fluid collections are identified. IMPRESSION: 1. Findings in keeping cellulitis of the hand and in particular the second through fourth digits and more so involving the middle finger. No drainable fluid collections are identified to suggest abscess. 2. No evidence of osteomyelitis. Electronically Signed   By: Ashley Royalty M.D.   On: 11/12/2018 23:19   Dg Chest Port 1 View  Result Date: 11/13/2018 CLINICAL DATA:  Shortness of breath EXAM: PORTABLE CHEST  1 VIEW COMPARISON:  Chest CT from 3 days ago FINDINGS: Small focus of airspace disease in the left lung that was not seen previously. Normal heart size and mediastinal  contours. Right port in unremarkable position. IMPRESSION: Small airspace opacity on the left that was not seen on chest CT 3 days ago and consistent with an inflammatory process, presumably pneumonia. Electronically Signed   By: Monte Fantasia M.D.   On: 11/13/2018 09:35    Scheduled Meds: . budesonide  0.5 mg Inhalation BID  . clonazePAM  1 mg Oral TID  . escitalopram  20 mg Oral Daily  . estradiol  2 mg Oral Daily  . fluticasone furoate-vilanterol  1 puff Inhalation Daily  . furosemide  40 mg Oral BID  . hydrocortisone  10 mg Oral QPC lunch  . hydrocortisone  20 mg Oral Q breakfast  . levothyroxine  112 mcg Oral QAC breakfast  . lidocaine  5 mL Intradermal STAT  . metoprolol succinate  37.5 mg Oral Daily  . potassium citrate  10 mEq Oral Daily  . rosuvastatin  10 mg Oral Daily  . sodium chloride flush  10-40 mL Intracatheter Q12H  . sodium chloride flush  3 mL Intravenous Q12H  . topiramate  150 mg Oral QHS   Continuous Infusions: . sodium chloride 1,000 mL (11/10/18 0357)  .  ceFAZolin (ANCEF) IV 1 g (11/13/18 1354)    LOS: 6 days   Kerney Elbe, DO [facility] PAGER is on [place]  If 7PM-7AM, please contact night-coverage [email or web address] Password [facility] 11/13/2018, 6:27 PM

## 2018-11-13 NOTE — Plan of Care (Signed)
Problem: Education: Goal: Knowledge of General Education information will improve Description Including pain rating scale, medication(s)/side effects and non-pharmacologic comfort measures Outcome: Progressing   Problem: Health Behavior/Discharge Planning: Goal: Ability to manage health-related needs will improve Outcome: Progressing   Problem: Activity: Goal: Risk for activity intolerance will decrease Outcome: Progressing   Problem: Nutrition: Goal: Adequate nutrition will be maintained Outcome: Progressing   Problem: Coping: Goal: Level of anxiety will decrease Outcome: Progressing   Problem: Elimination: Goal: Will not experience complications related to urinary retention Outcome: Progressing   Problem: Pain Managment: Goal: General experience of comfort will improve Outcome: Progressing   Problem: Safety: Goal: Ability to remain free from injury will improve Outcome: Progressing   Problem: Skin Integrity: Goal: Risk for impaired skin integrity will decrease Outcome: Progressing   Problem: Clinical Measurements: Goal: Ability to avoid or minimize complications of infection will improve Outcome: Progressing

## 2018-11-13 NOTE — Consult Note (Signed)
RN to consult Lab to draw blood ultures

## 2018-11-14 ENCOUNTER — Encounter (HOSPITAL_COMMUNITY)
Admission: EM | Disposition: A | Discharge status: Home / Self Care | Payer: Self-pay | Source: Home / Self Care | Attending: Internal Medicine

## 2018-11-14 ENCOUNTER — Inpatient Hospital Stay (HOSPITAL_COMMUNITY): Payer: BLUE CROSS/BLUE SHIELD

## 2018-11-14 DIAGNOSIS — L03011 Cellulitis of right finger: Principal | ICD-10-CM

## 2018-11-14 DIAGNOSIS — Z8571 Personal history of Hodgkin lymphoma: Secondary | ICD-10-CM

## 2018-11-14 DIAGNOSIS — L98499 Non-pressure chronic ulcer of skin of other sites with unspecified severity: Secondary | ICD-10-CM

## 2018-11-14 DIAGNOSIS — R509 Fever, unspecified: Secondary | ICD-10-CM

## 2018-11-14 DIAGNOSIS — I7300 Raynaud's syndrome without gangrene: Secondary | ICD-10-CM

## 2018-11-14 HISTORY — PX: VIDEO BRONCHOSCOPY: SHX5072

## 2018-11-14 LAB — COMPREHENSIVE METABOLIC PANEL
ALT: 30 U/L (ref 0–44)
AST: 28 U/L (ref 15–41)
Albumin: 3.2 g/dL — ABNORMAL LOW (ref 3.5–5.0)
Alkaline Phosphatase: 50 U/L (ref 38–126)
Anion gap: 10 (ref 5–15)
BUN: 11 mg/dL (ref 6–20)
CO2: 23 mmol/L (ref 22–32)
Calcium: 8.5 mg/dL — ABNORMAL LOW (ref 8.9–10.3)
Chloride: 103 mmol/L (ref 98–111)
Creatinine, Ser: 0.91 mg/dL (ref 0.44–1.00)
GFR calc Af Amer: >60 mL/min (ref >60)
GFR calc non Af Amer: >60 mL/min (ref >60)
Glucose, Bld: 121 mg/dL — ABNORMAL HIGH (ref 70–99)
Potassium: 3.1 mmol/L — ABNORMAL LOW (ref 3.5–5.1)
Sodium: 136 mmol/L (ref 135–145)
Total Bilirubin: 0.8 mg/dL (ref 0.3–1.2)
Total Protein: 6.7 g/dL (ref 6.5–8.1)

## 2018-11-14 LAB — CBC WITH DIFFERENTIAL/PLATELET
Abs Immature Granulocytes: 0.09 10*3/uL — ABNORMAL HIGH (ref 0.00–0.07)
Basophils Absolute: 0.1 10*3/uL (ref 0.0–0.1)
Basophils Relative: 0 %
Eosinophils Absolute: 0.1 10*3/uL (ref 0.0–0.5)
Eosinophils Relative: 1 %
HEMATOCRIT: 34.9 % — AB (ref 36.0–46.0)
Hemoglobin: 11.1 g/dL — ABNORMAL LOW (ref 12.0–15.0)
Immature Granulocytes: 1 %
LYMPHS ABS: 3 10*3/uL (ref 0.7–4.0)
Lymphocytes Relative: 17 %
MCH: 29.4 pg (ref 26.0–34.0)
MCHC: 31.8 g/dL (ref 30.0–36.0)
MCV: 92.3 fL (ref 80.0–100.0)
MONOS PCT: 9 %
Monocytes Absolute: 1.5 10*3/uL — ABNORMAL HIGH (ref 0.1–1.0)
Neutro Abs: 12.3 10*3/uL — ABNORMAL HIGH (ref 1.7–7.7)
Neutrophils Relative %: 72 %
Platelets: 250 10*3/uL (ref 150–400)
RBC: 3.78 MIL/uL — ABNORMAL LOW (ref 3.87–5.11)
RDW: 13.4 % (ref 11.5–15.5)
WBC: 17.1 10*3/uL — ABNORMAL HIGH (ref 4.0–10.5)
nRBC: 0 % (ref 0.0–0.2)

## 2018-11-14 LAB — ANCA TITERS
Atypical P-ANCA titer: <1:20 {titer}
C-ANCA: <1:20 {titer}

## 2018-11-14 LAB — RHEUMATOID FACTOR: Rheumatoid fact SerPl-aCnc: 11.1 IU/mL (ref 0.0–13.9)

## 2018-11-14 LAB — PHOSPHORUS: Phosphorus: 2.1 mg/dL — ABNORMAL LOW (ref 2.5–4.6)

## 2018-11-14 LAB — MAGNESIUM: Magnesium: 2 mg/dL (ref 1.7–2.4)

## 2018-11-14 SURGERY — VIDEO BRONCHOSCOPY WITHOUT FLUORO
Anesthesia: Moderate Sedation | Laterality: Bilateral

## 2018-11-14 MED ORDER — LIDOCAINE HCL 1 % IJ SOLN
INTRAMUSCULAR | Status: DC | PRN
  Administered 2018-11-14: 6 mL via RESPIRATORY_TRACT

## 2018-11-14 MED ORDER — POTASSIUM CHLORIDE 10 MEQ/100ML IV SOLN
10.0000 meq | INTRAVENOUS | Status: AC
  Administered 2018-11-14 (×4): 10 meq via INTRAVENOUS
  Filled 2018-11-14 (×4): qty 100

## 2018-11-14 MED ORDER — LIDOCAINE HCL URETHRAL/MUCOSAL 2 % EX GEL
CUTANEOUS | Status: DC | PRN
  Administered 2018-11-14: 1

## 2018-11-14 MED ORDER — SODIUM CHLORIDE 0.9 % IV SOLN
INTRAVENOUS | Status: DC
  Administered 2018-11-15: 06:00:00 via INTRAVENOUS

## 2018-11-14 MED ORDER — PHENYLEPHRINE HCL 0.25 % NA SOLN
NASAL | Status: DC | PRN
  Administered 2018-11-14: 2 via NASAL

## 2018-11-14 MED ORDER — BUTAMBEN-TETRACAINE-BENZOCAINE 2-2-14 % EX AERO: 1.0000 | INHALATION_SPRAY | Freq: Once | CUTANEOUS | Status: DC

## 2018-11-14 MED ORDER — FENTANYL CITRATE (PF) 100 MCG/2ML IJ SOLN
INTRAMUSCULAR | Status: DC | PRN
  Administered 2018-11-14: 50 ug via INTRAVENOUS
  Administered 2018-11-14: 75 ug via INTRAVENOUS

## 2018-11-14 MED ORDER — LIDOCAINE HCL 2 % EX GEL: 1.0000 "application " | Freq: Once | CUTANEOUS | Status: DC

## 2018-11-14 MED ORDER — K PHOS MONO-SOD PHOS DI & MONO 155-852-130 MG PO TABS
500.0000 mg | ORAL_TABLET | Freq: Two times a day (BID) | ORAL | Status: AC
  Administered 2018-11-14: 500 mg via ORAL
  Filled 2018-11-14 (×2): qty 2

## 2018-11-14 MED ORDER — FENTANYL CITRATE (PF) 100 MCG/2ML IJ SOLN
INTRAMUSCULAR | Status: AC
  Filled 2018-11-14: qty 4

## 2018-11-14 MED ORDER — FENTANYL CITRATE (PF) 100 MCG/2ML IJ SOLN: 25.0000 ug | INTRAMUSCULAR | Status: DC | PRN

## 2018-11-14 MED ORDER — MIDAZOLAM HCL (PF) 10 MG/2ML IJ SOLN
INTRAMUSCULAR | Status: DC | PRN
  Administered 2018-11-14 (×2): 1 mg via INTRAVENOUS

## 2018-11-14 MED ORDER — POTASSIUM CHLORIDE CRYS ER 20 MEQ PO TBCR
40.0000 meq | EXTENDED_RELEASE_TABLET | Freq: Two times a day (BID) | ORAL | Status: DC
  Administered 2018-11-14: 40 meq via ORAL
  Filled 2018-11-14: qty 2

## 2018-11-14 MED ORDER — PHENYLEPHRINE HCL 0.25 % NA SOLN: 1.0000 | Freq: Four times a day (QID) | NASAL | Status: DC | PRN

## 2018-11-14 MED ORDER — MIDAZOLAM HCL 2 MG/2ML IJ SOLN: 1.0000 mg | INTRAMUSCULAR | Status: DC | PRN

## 2018-11-14 MED ORDER — MIDAZOLAM HCL (PF) 5 MG/ML IJ SOLN
INTRAMUSCULAR | Status: AC
  Filled 2018-11-14: qty 2

## 2018-11-14 MED ORDER — OSELTAMIVIR PHOSPHATE 75 MG PO CAPS
75.0000 mg | ORAL_CAPSULE | Freq: Every day | ORAL | Status: DC
  Administered 2018-11-14: 75 mg via ORAL
  Filled 2018-11-14 (×2): qty 1

## 2018-11-14 MED ORDER — SODIUM CHLORIDE 0.9 % IV BOLUS
500.0000 mL | Freq: Once | INTRAVENOUS | Status: AC
  Administered 2018-11-14: 500 mL via INTRAVENOUS

## 2018-11-14 NOTE — Progress Notes (Signed)
PROGRESS NOTE    [name]  UJW:[id number] DOB: Aug 17, 1971 DOA: 11/07/2018 PCP: Su Grand, MD   Brief Narrative:  [name] is a 48 y.o. 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   She was first evaluated for blisters which developed into ulcerations of both hands by her PCP 08/2018.  At that time she was treated with one-time dose of Rocephin in the office followed by Keflex.  Due to subjective fevers at that time she was evaluated in the ED and discharged on clindamycin x 11days, left upper extremity ultrasound at that time showed associated superficial occlusive thrombus of left basilic vein with no DVT. ED evaluation at that time also included CTA negative for PE  Since then ambulatory referral to Rheumatology was placed by her primary oncologist. She has had persistent swelling in both hands without any new ulcerations until a few weeks ago with this current presentation.  A week ago noticed right middle finger blisters in middle of finger. It rsolved on its own and she covered with neosporin and dressed for a few days then it turned black with some purulent drainage. Then started noticing redness, numbness x 2 days. Pain extending in to hand during that time as  well.  E visit yesterday---who advised being seen by provider face to face to rule out systemic infection. Denies fevers, chills. Chronic nausea. No vomiting, abd pain, no history of trauma/injury to hand. No other ulcers. No joint pain or aches. No dysuria, no diarrhea/constipation  This a.m. she had 2 episodes of small-volume hemoptysis while coughing.  Reports some increased shortness of breath.  No fevers or chills.  Also shows new small lesions on left wrist and left forearm and right forearm..  **ID, Pulmonary and Hand Surgery were consulted and workup still pending. She was attempted to undergo Bronchoscopy on 2/14 at 2:00 pm but per my Conversation with Dr. ELoanne Drillingwas unable to. Flu PCR is Negative. ID consulted and recommended General Surgery to biopsy her skin ulcer on her arm and current feeling is that it is 2/2 to a Vasculitic Process. She spiked a temperature overnight and so Blood Cx were ordered but never drawn so they were ordered again. Still feels bad and had a higher fever than this AM.   Assessment & Plan:   Principal Problem:   Cellulitis of right middle finger Active Problems:   Hodgkin lymphoma (HCC)   Breast cancer (HCC)   Thyroid cancer (HCC)   Adrenal insufficiency (HCC)   Hypertension   Hyperlipidemia   Edema   Right heart failure (HCC)   Nephrolithiasis   Oxygen dependent   Migraine   Hemoptysis   SOB (shortness of breath)   FUO (fever of unknown origin)  Right third finger ulceration, with Cellulitis  Fever and new Leukocytosis and ? PNA; Worsening fever -X-ray imaging shows involvement of subcutaneous tissue with  no obvious involvement with bone.   -Dr. Amedeo Plenty with surgery following no signs of abscess/compartment syndrome or lymphangitis.   -Given she has no sepsis physiology and no obvious worsening erythema she was transitioned to doxycycline on 2/10 but now has new lesions on left wrist and forearm and right forearm (slight abrasions with some  surrounding erythema).   -Discontinued Doxycycline and started Clindamycin since she has done well with this in the past (prior presentation at [facility] during November 2019)and closely monitor over next 24 hours to ensure no fevers or worsening clinical status but discussed with ID Dr. Tommy Medal who recommended changing to Cefazolin and obtaining an MRI of the Hand -MRI of Right Hand ordered along with Inflammatory Markers and (ESR was 18 and CRP was 1.3) and along with ANCA titers, Rheumatoid Factor; ID added other Autoimmune studies including Anti-phospholidid Ab and SSA/SSB -Also obtaining UDS   -TTE negative for vegetation, no DVT in R arm, no clot in subclavian.   -Blood cultures ordered because she became febrile at 100.8 but patient refused them -WBC also elevated to 14,000 and worsened to 17,100 -Further Abx Recommendations per ID; Influenza Negative via PCR but ID feels as if this may be a False positive and ID recommends that if she goes for Bronchoscopy tomorrow to send a new PCR from BAL for Influenza  -She has been started on prophylatic Influenza Dosing due to concern of Nosocomial Spread as her neighbors in the hospital have the Flu -Will get Strep A Cx -Continue to Monitor and Further Abx treatment course per ID  Small volume hemoptysis episodes -Becoming more frequent.  Occurring with cough.   -CT chest negative for any new masses, chronic nodule shown.   -Sending for culture to assess for potential infection given some diminished breath sounds in left bases.  -CXR showed Small airspace opacity on the left that was not seen on chest CT 3 days ago and consistent with an inflammatory process, presumably pneumonia. -Will Consult Pulmonary for further evaluation and recommendations and defer to them for further workup -Pulmonary planning on doing a Bronchoscopy and this was attempted today but patient stated she had problem with sedation and usually requires deep sedation with Propofol  but it was attempted and then aborted. Dr. Cordelia Pen partner to follow up this weekend and ? If Bronchosopy is going to be rescheduled next week under Anesthesia -Per my conversation with Dr. Hale Bogus she feels as if the patient's Hempotypsis could be 2/2 to Nose bleeding.  -Continue to Monitor   Subacute history of blisters developing into ulcers and bilateral hand and arm swelling.   -Ongoing for 3 months.  Is unclear if this is related to some autoimmune process (has history of Raynaud's phenomenon).   -Increased Lasix to 40 mg BID  -CCP was obtained on 08/2018 at ED vist and was unremarkable.   -HIV and ANA negative here. Will obtain ANCA Titers, Rhuematoid Factor, ESR and CRP and also obtain a UDS; As above -RA Latex Turbid was 11.1 -Unclear family history as patient is adopted, does not have any other systemic symptoms (no joint aches, no joint pain,). -CT chest was negative for any new metastatic lesions or any lymphadenopathy  -Dr. Tommy Medal reached out to Dr. Amil Amen of Rheumatology who may offer more insight -C/w IV Cefazolin for now -General Surgery consulted for Biopsy of skin ulceration -Follow up on studies ordered   -Will discuss with Hematology/Oncology given her previous Hx of Breast Cancer and Hodgkins Lymphoma about  the possiblity of Paraneoplastic syndromes  -Has an outpatient rheumatology referral scheduled in March   Adrenal insufficiency secondary to pituitary resection for adenoma (2002, status post gamma knife surgery for regrowth of tumor in 2015).   -Discontinued IV hydrocortisone and transitioned to oral stress dosing 20 mg of hydrocortisone x3 days followed by reinitiation of her home regimen 10 mg in the a.m. 5 mg in p.m. 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   -Status post 14-day  treatment Lovenox by oncologist in 08/2018 -Continue to Monitor   Chronic hypoxic respiratory failure, stable on home 3 L nasal cannula.  ? 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   -Followed by [facility] pulmonology.  Continue home Breo Pulmicort added here.  Albuterol PRN -Pulmonary Folllowing and planning on doing Bronchoscopy  -Per [facility] Notes Dr. Geraldo Docker:  "She has already had much of the work-up for this (CT chest, PFTs, TTE, RHC--note she does not have PAH based on her RHC with wedge 16 and PA mean 20.) though we have not objectively confirmed it in our own clinic. A low sat was documented in the cancer center once early on (at rest) but she has not made it to a 6MWT and her CPET in 2017 was very normal in terms of oxygenation. She cannot walk aggressively enough for a 6MWT now given her leg weakness. She thinks she can exercise vigorously on a bike and wants to repeat a CPET. This will also provide info about [place] cardiac [place] - CPET on room air" -Patient never presented for CPET  History of Aortic Stenosis.  -Documented in [facility] Chart. Systolic murmur heard on exam. Last TTE at [facility] on 08/2018 showed no stenosis.  -No vegetation on TTE here, does have tricupsid valve structure/calcification moderate.   Chronic Nausea.   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   -Followed by Dr. Wendee Beavers in [place]  Stage IV Hodgkin's disease -Diagnosed at age 3.  Status post chemotherapy and mantle field  radiotherapy  GERD -C/w Dexilant substitution   HLD,stable -C/w Home Rosuvastatin 10 mg po daily   Post-Surgical Hypothyroidism.  -TSH suppressed but not accurate given prior pituitary resection.  -Follow free T4.  -Continue home synthroid at current dose of 112 mcg   History of CHF with reduced EF. -Last TTE 08/2018 showed normal systolic and diastolic function. BNP here unremarkable.  -Continued home lasix of 20 mg po BID but now increased to 40 mg BID  -Strict I's/O's and Daily Weights; She is +4.194 Liters since admission; Weight's Not done  Normocytic Anemia  -Patient's Hb/Hct went from 12.1/39.1 -> 11.1/36.3 -> 11.2/36.5 -> 11.1/34.9 -Check Anemia Panel -Continue to Monitor for S/Sx for Bleeding -Repeat CBC in AM   Hypokalemia -Patient's potassium this morning was 3.1 -Replete with p.o. KCl 40 mEq however patient was unable to hold due to nausea so change IV 40 mEq -Also replete with K-Phos 500 mg p.o. twice daily x2 doses -  Continue to monitor and replete as necessary -Repeat CMP in a.m.  Hypophosphatemia -Patient's phosphorus level is currently 2.1 -Replete with p.o. K-Phos hour milligrams p.o. twice daily x2 doses -Continue to monitor and replete as necessary -Repeat phosphorus level in a.m.  DVT prophylaxis: Lovenox Held due to hemoptysis  Code Status: FULL CODE Family Communication: No family present at bedside  Disposition Plan: Remain Inpatient for further workup and evaluation   Consultants:   Pulmonary  Infectious Diseases  Hand Surgery   General Surgery   Procedures:   None  Antimicrobials:  Anti-infectives (From admission, onward)   Start     Dose/Rate Route Frequency Ordered Stop   11/14/18 1000  oseltamivir (TAMIFLU) capsule 75 mg     75 mg Oral Daily 11/14/18 0833 11/19/18 0959   11/12/18 2200  ceFAZolin (ANCEF) IVPB 1 g/50 mL premix     1 g 100 mL/hr over 30 Minutes Intravenous Every 8 hours 11/12/18 1841     11/12/18 1930   ceFAZolin (ANCEF) IVPB 1 g/50 mL premix  Status:  Discontinued     1 g 100 mL/hr over 30 Minutes Intravenous Every 8 hours 11/12/18 1838 11/12/18 1841   11/11/18 1400  clindamycin (CLEOCIN) IVPB 600 mg  Status:  Discontinued     600 mg 100 mL/hr over 30 Minutes Intravenous Every 8 hours 11/11/18 1014 11/12/18 1814   11/10/18 1645  doxycycline (VIBRA-TABS) tablet 100 mg  Status:  Discontinued     100 mg Oral Every 12 hours 11/10/18 1641 11/11/18 1014   11/08/18 1000  vancomycin (VANCOCIN) IVPB 1000 mg/200 mL premix  Status:  Discontinued     1,000 mg 200 mL/hr over 60 Minutes Intravenous Every 12 hours 11/07/18 2115 11/09/18 1545   11/07/18 2130  vancomycin (VANCOCIN) IVPB 750 mg/150 ml premix     750 mg 150 mL/hr over 60 Minutes Intravenous  Once 11/07/18 2110 11/07/18 2310   11/07/18 2000  vancomycin (VANCOCIN) IVPB 1000 mg/200 mL premix     1,000 mg 200 mL/hr over 60 Minutes Intravenous  Once 11/07/18 1955 11/07/18 2117     Subjective: Seen and examined at bedside and states that she was feeling worse today.  Still feeling nauseous.  Denies lightheadedness or dizziness but was wanting to rest.  No other concerns or complaints at this time.  ID recommended placing patient on prophylactic dosing of Tamiflu given concern about no most, influenza spread.  Patient was unable to go for a bronchoscopy due to tolerance.  Will further evaluate per pulmonology recommendations.  Objective: Vitals:   11/14/18 1513 11/14/18 1515 11/14/18 1517 11/14/18 1520  BP:  101/61 101/61 101/61  Pulse: 88 88 87 90  Resp: _0 Temp:      TempSrc:      SpO2: 95% 94% 95% 95%  Weight:      Height:        Intake/Output Summary (Last 24 hours) at 11/14/2018 1657 Last data filed at 11/14/2018 1500 Gross per 24 hour  Intake 847.67 ml  Output -  Net 847.67 ml   Filed Weights   11/07/18 1635  Weight: 80 kg   Examination: Physical Exam:  Constitutional: Well-nourished, well-developed overweight  Caucasian female who appears uncomfortable and slightly ill-appearing and fatigued Eyes: Conjunctive are normal.  Sclera anicteric ENMT: External ears nose appear normal.  Grossly normal hearing. Neck: Appears supple no JVD Respiratory: Slightly diminished to auscultation bilaterally but more so on the left lung base.  No accessory  muscle usage but she is removed her supplemental oxygen via nasal cannula prior to ambulating to the bathroom Cardiovascular: Rate and rhythm.  No appreciable murmurs, rubs, gallops.   Abdomen: Soft, nontender, distended secondary body habitus.  Bowel sounds present GU: Deferred Musculoskeletal: No contractures or cyanosis.  No joint deformity noted Skin: Has some skin blistering and a swollen middle finger with a few blisters on the right arm and the left arm.  Skin is warm and dry no appreciable rashes but has significant edema in the upper extremities which appear to be improved Neurologic: Cranial nerves II through XII grossly intact no appreciable focal deficits Psychiatric: Depressed appearing has a flat affect.  Normal judgment and insight  Data Reviewed: I have personally reviewed following labs and imaging studies  CBC: Recent Labs  Lab 11/07/18 1806 11/09/18 1447 11/10/18 0937 11/12/18 0922 11/13/18 0512 11/14/18 0403  WBC 6.1 7.2 6.9 7.0 14.0* 17.1*  NEUTROABS 3.6  --   --  4.3 10.8* 12.3*  HGB 12.1 11.5* 10.8* 11.1* 11.2* 11.1*  HCT 39.1 36.3 35.7* 36.3 36.5 34.9*  MCV 94.7 92.1 93.7 92.6 91.7 92.3  PLT 263 245 249 258 268 465   Basic Metabolic Panel: Recent Labs  Lab 11/07/18 1806 11/08/18 0330 11/12/18 0922 11/13/18 0512 11/14/18 0403  NA 140 139 140 135 136  K 3.7 4.4 3.5 3.6 3.1*  CL 112* 108 104 101 103  CO2 21* 21* _0 GLUCOSE 124* 105* 123* 130* 121*  BUN _1 CREATININE 0.86 0.94 1.04* 1.03* 0.91  CALCIUM 8.4* 9.0 8.6* 8.9 8.5*  MG  --   --  2.0 1.8 2.0  PHOS  --   --  4.3 3.0 2.1*   GFR: Estimated  Creatinine Clearance: 79.9 mL/min (by C-G formula based on SCr of 0.91 mg/dL). Liver Function Tests: Recent Labs  Lab 11/12/18 0922 11/13/18 0512 11/14/18 0403  AST _2 ALT 32 30 30  ALKPHOS 39 44 50  BILITOT 0.3 0.6 0.8  PROT 6.0* 6.5 6.7  ALBUMIN 3.2* 3.3* 3.2*   No results for input(s): LIPASE, AMYLASE in the last 168 hours. No results for input(s): AMMONIA in the last 168 hours. Coagulation Profile: No results for input(s): INR, PROTIME in the last 168 hours. Cardiac Enzymes: No results for input(s): CKTOTAL, CKMB, CKMBINDEX, TROPONINI in the last 168 hours. BNP (last 3 results) No results for input(s): PROBNP in the last 8760 hours. HbA1C: No results for input(s): HGBA1C in the last 72 hours. CBG: Recent Labs  Lab 11/12/18 1216 11/13/18 1116  GLUCAP 94 112*   Lipid Profile: No results for input(s): CHOL, HDL, LDLCALC, TRIG, CHOLHDL, LDLDIRECT in the last 72 hours. Thyroid Function Tests: No results for input(s): TSH, T4TOTAL, FREET4, T3FREE, THYROIDAB in the last 72 hours. Anemia Panel: No results for input(s): VITAMINB12, FOLATE, FERRITIN, TIBC, IRON, RETICCTPCT in the last 72 hours. Sepsis Labs: Recent Labs  Lab 11/07/18 1806  LATICACIDVEN 1.3    Recent Results (from the past 240 hour(s))  Expectorated sputum assessment w rflx to resp cult     Status: None   Collection Time: 11/11/18  3:21 PM  Result Value Ref Range Status   Specimen Description EXPECTORATED SPUTUM  Final   Special Requests NONE  Final   Sputum evaluation   Final    Sputum specimen not acceptable for testing.  Please recollect.   RESULT CALLED TO, READ BACK BY AND VERIFIED WITH: R RAKHIMOVA  RN 2255 11/11/18 A BROWNING Performed at [facility], [place] [address], [place], [place] [address]    Report Status 11/11/2018 FINAL  Final  Expectorated sputum assessment w rflx to resp cult     Status: None   Collection Time: 11/12/18  3:31 AM  Result Value Ref Range Status   Specimen  Description EXPECTORATED SPUTUM  Final   Special Requests NONE  Final   Sputum evaluation   Final    Sputum specimen not acceptable for testing.  Please recollect.   RESULT CALLED TO, READ BACK BY AND VERIFIED WITH: RALCHIMOVA RN 11/12/18 0605 JDW Performed at [facility] [address] [place], [place] [address]    Report Status 11/12/2018 FINAL  Final    Radiology Studies: Mr Hand Right W Wo Contrast  Result Date: 11/12/2018 CLINICAL DATA:  48 year old female with 3 month history of bilateral hand swelling with intermittent ulcerations. Patient developed blisters to the right hand and developed an ulcer with worsening erythema and pain of the right middle finger. EXAM: MRI OF THE RIGHT HAND WITHOUT AND WITH CONTRAST TECHNIQUE: Multiplanar, multisequence MR imaging of the right hand was performed before and after the administration of intravenous contrast. CONTRAST:  8 mL Gadavist IV COMPARISON:  11/07/2018 hand radiographs FINDINGS: Bones/Joint/Cartilage No marrow signal abnormality of the included hand and wrist. No fracture, joint dislocation nor findings of osteomyelitis. No significant erosive change nor suspicious osseous lesions. Ligaments Intact. No thickening of the transverse ligament of the carpal tunnel. Muscles and Tendons Intact flexor and extensor tendons without evidence of tenosynovitis. Unremarkable carpal tunnel. Median nerve is unremarkable. No evidence of myositis or pyomyositis. Soft tissues Nonspecific subcutaneous soft tissue edema of the included dorsum of the hand consistent with cellulitis with extension of soft tissue edema involving the second through fourth digits, greatest along the middle finger. Drainable fluid collections are identified. IMPRESSION: 1. Findings in keeping cellulitis of the hand and in particular the second through fourth digits and more so involving the middle finger. No drainable fluid collections are identified to suggest abscess. 2. No  evidence of osteomyelitis. Electronically Signed   By: Ashley Royalty M.D.   On: 11/12/2018 23:19   Dg Chest Port 1 View  Result Date: 11/14/2018 CLINICAL DATA:  Shortness of breath and cough EXAM: PORTABLE CHEST 1 VIEW COMPARISON:  Yesterday FINDINGS: Progression of left upper lobe airspace disease compatible with pneumonia. There is also history of hemoptysis and alveolar hemorrhage is possible. Porta catheter in good position. Normal heart size. IMPRESSION: Progressive left upper lobe airspace opacity which could be pneumonia or alveolar hemorrhage based on the history. Electronically Signed   By: Monte Fantasia M.D.   On: 11/14/2018 08:56   Dg Chest Port 1 View  Result Date: 11/13/2018 CLINICAL DATA:  Shortness of breath EXAM: PORTABLE CHEST 1 VIEW COMPARISON:  Chest CT from 3 days ago FINDINGS: Small focus of airspace disease in the left lung that was not seen previously. Normal heart size and mediastinal contours. Right port in unremarkable position. IMPRESSION: Small airspace opacity on the left that was not seen on chest CT 3 days ago and consistent with an inflammatory process, presumably pneumonia. Electronically Signed   By: Monte Fantasia M.D.   On: 11/13/2018 09:35    Scheduled Meds: . budesonide  0.5 mg Inhalation BID  . clonazePAM  1 mg Oral TID  . escitalopram  20 mg Oral Daily  . estradiol  2 mg Oral Daily  .  fluticasone furoate-vilanterol  1 puff Inhalation Daily  . furosemide  40 mg Oral BID  . hydrocortisone  10 mg Oral QPC lunch  . hydrocortisone  20 mg Oral Q breakfast  . levothyroxine  112 mcg Oral QAC breakfast  . metoprolol succinate  37.5 mg Oral Daily  . oseltamivir  75 mg Oral Daily  . phosphorus  500 mg Oral BID  . potassium citrate  10 mEq Oral Daily  . rosuvastatin  10 mg Oral Daily  . sodium chloride flush  10-40 mL Intracatheter Q12H  . topiramate  150 mg Oral QHS   Continuous Infusions: . sodium chloride Stopped (11/14/18 1557)  .  ceFAZolin (ANCEF) IV  1 g (11/14/18 1625)    LOS: 7 days   Kerney Elbe, DO [facility] PAGER is on [place]  If 7PM-7AM, please contact night-coverage [email or web address] Password [facility] 11/14/2018, 4:57 PM

## 2018-11-14 NOTE — Progress Notes (Signed)
Quick visit to look at punch bx site  Healing well No sign of infection.  Cover with dry gauze/ change daily. Can put triple antibiotic ointment.   Signing off pls call with questions Doesn't need to f/u with [facility]  Leighton Ruff. Redmond Pulling, MD, FACS General, Bariatric, & Minimally Invasive Surgery [facility], [place]

## 2018-11-14 NOTE — Procedures (Addendum)
Bronchoscopy Procedure Note EMALENE WELTE [id number] 1971-08-20  Procedure: Bronchoscopy Indications: Diagnostic evaluation of the airways. Airway inspection for hemoptysis   Procedure Details Consent: Risks of procedure as well as the alternatives and risks of each were explained to the (patient/caregiver).  Consent for procedure obtained. Time Out: Verified patient identification, verified procedure, site/side was marked, verified correct patient position, special equipment/implants available, medications/allergies/relevent history reviewed, required imaging and test results available.  Performed  In preparation for procedure, patient was given 100% FiO2 and bronchoscope lubricated. Sedation: Fentanyl 125 mcg and Versed 2 mg  Airway entered and the following bronchi were examined: Only larynx was visualized. Patient unable to tolerate lidocaine spray to larynx. Procedure aborted at patient's request.  Evaluation Hemodynamic Status: BP stable throughout; O2 sats: stable throughout Patient's Current Condition: stable Specimens:  None Complications: No apparent complications Patient did not tolerate procedure well.  Impression: Bronchoscopy aborted at patient request With her developing pneumonia, her hemoptysis is likely related to infection.  PLAN: Pulmonary team will continue to follow. Collect sputum culture If bronchoscopy is to be completed in the future, patient will need to be intubated due to high sedation tolerance.   Chi Rodman Pickle 11/14/2018

## 2018-11-14 NOTE — Progress Notes (Signed)
Subjective:  Higher fever this morning.  Antibiotics:  Anti-infectives (From admission, onward)   Start     Dose/Rate Route Frequency Ordered Stop   11/14/18 1000  [MAR Hold]  oseltamivir (TAMIFLU) capsule 75 mg     (MAR Hold since Fri 11/14/2018 at 1501. Reason: Transfer to a Procedural area.)   75 mg Oral Daily 11/14/18 0833 11/19/18 0959   11/12/18 2200  [MAR Hold]  ceFAZolin (ANCEF) IVPB 1 g/50 mL premix     (MAR Hold since Fri 11/14/2018 at 1501. Reason: Transfer to a Procedural area.)   1 g 100 mL/hr over 30 Minutes Intravenous Every 8 hours 11/12/18 1841     11/12/18 1930  ceFAZolin (ANCEF) IVPB 1 g/50 mL premix  Status:  Discontinued     1 g 100 mL/hr over 30 Minutes Intravenous Every 8 hours 11/12/18 1838 11/12/18 1841   11/11/18 1400  clindamycin (CLEOCIN) IVPB 600 mg  Status:  Discontinued     600 mg 100 mL/hr over 30 Minutes Intravenous Every 8 hours 11/11/18 1014 11/12/18 1814   11/10/18 1645  doxycycline (VIBRA-TABS) tablet 100 mg  Status:  Discontinued     100 mg Oral Every 12 hours 11/10/18 1641 11/11/18 1014   11/08/18 1000  vancomycin (VANCOCIN) IVPB 1000 mg/200 mL premix  Status:  Discontinued     1,000 mg 200 mL/hr over 60 Minutes Intravenous Every 12 hours 11/07/18 2115 11/09/18 1545   11/07/18 2130  vancomycin (VANCOCIN) IVPB 750 mg/150 ml premix     750 mg 150 mL/hr over 60 Minutes Intravenous  Once 11/07/18 2110 11/07/18 2310   11/07/18 2000  vancomycin (VANCOCIN) IVPB 1000 mg/200 mL premix     1,000 mg 200 mL/hr over 60 Minutes Intravenous  Once 11/07/18 1955 11/07/18 2117      Medications: Scheduled Meds: . [MAR Hold] budesonide  0.5 mg Inhalation BID  . butamben-tetracaine-benzocaine  1 spray Topical Once  . [MAR Hold] clonazePAM  1 mg Oral TID  . [MAR Hold] escitalopram  20 mg Oral Daily  . [MAR Hold] estradiol  2 mg Oral Daily  . [MAR Hold] fluticasone furoate-vilanterol  1 puff Inhalation Daily  . [MAR Hold] furosemide  40 mg Oral BID    . [MAR Hold] hydrocortisone  10 mg Oral QPC lunch  . [MAR Hold] hydrocortisone  20 mg Oral Q breakfast  . [MAR Hold] levothyroxine  112 mcg Oral QAC breakfast  . lidocaine  1 application Topical Once  . [MAR Hold] metoprolol succinate  37.5 mg Oral Daily  . [MAR Hold] oseltamivir  75 mg Oral Daily  . [MAR Hold] phosphorus  500 mg Oral BID  . [MAR Hold] potassium citrate  10 mEq Oral Daily  . [MAR Hold] rosuvastatin  10 mg Oral Daily  . [MAR Hold] sodium chloride flush  10-40 mL Intracatheter Q12H  . [MAR Hold] sodium chloride flush  3 mL Intravenous Q12H  . [MAR Hold] topiramate  150 mg Oral QHS   Continuous Infusions: . [MAR Hold] sodium chloride 10 mL/hr at 11/13/18 1800  . sodium chloride Stopped (11/14/18 1557)  . [MAR Hold]  ceFAZolin (ANCEF) IV 1 g (11/14/18 0537)   PRN Meds:.[MAR Hold] sodium chloride, [MAR Hold] acetaminophen, [MAR Hold] cyclobenzaprine, [MAR Hold] diphenhydrAMINE, [MAR Hold] fentaNYL (SUBLIMAZE) injection, [MAR Hold]  HYDROmorphone (DILAUDID) injection, [MAR Hold] midazolam, phenylephrine, [MAR Hold] sodium chloride flush, [MAR Hold] sodium chloride flush, [MAR Hold] sucralfate, [MAR Hold] traMADol, [MAR Hold] zolpidem  Objective: Weight change:   Intake/Output Summary (Last 24 hours) at 11/14/2018 1600 Last data filed at 11/14/2018 1500 Gross per 24 hour  Intake 847.67 ml  Output -  Net 847.67 ml   Blood pressure 101/61, pulse 90, temperature (!) 101.9 F (38.8 C), resp. rate 14, height _0  (1.651 m), weight 80 kg, SpO2 95 %. Temp:  [99 F (37.2 C)-102.9 F (39.4 C)] 101.9 F (38.8 C) (02/14 0508) Pulse Rate:  [87-114] 90 (02/14 1520) Resp:  [11-20] 14 (02/14 1520) BP: (93-118)/(60-89) 101/61 (02/14 1520) SpO2:  [93 %-98 %] 95 % (02/14 1520)  Physical Exam: General: Alert and awake, oriented x3, not in any acute distress. HEENT: anicteric sclera, EOMI CVS regular rate, normal  Chest: , no wheezing, no respiratory distress Abdomen: soft  non-distended,  Extremities: no edema or deformity noted bilaterally  Neuro: nonfocal  Skin exam 11/14/2018:    Biopsy site was clean   CBC:    BMET Recent Labs    11/13/18 0512 11/14/18 0403  NA 135 136  K 3.6 3.1*  CL 101 103  CO2 24 23  GLUCOSE 130* 121*  BUN 13 11  CREATININE 1.03* 0.91  CALCIUM 8.9 8.5*     Liver Panel  Recent Labs    11/13/18 0512 11/14/18 0403  PROT 6.5 6.7  ALBUMIN 3.3* 3.2*  AST 27 28  ALT 30 30  ALKPHOS 44 50  BILITOT 0.6 0.8       Sedimentation Rate Recent Labs    11/13/18 0005  ESRSEDRATE 18   C-Reactive Protein Recent Labs    11/13/18 0005  CRP 1.3*    Micro Results: Recent Results (from the past 720 hour(s))  Expectorated sputum assessment w rflx to resp cult     Status: None   Collection Time: 11/11/18  3:21 PM  Result Value Ref Range Status   Specimen Description EXPECTORATED SPUTUM  Final   Special Requests NONE  Final   Sputum evaluation   Final    Sputum specimen not acceptable for testing.  Please recollect.   RESULT CALLED TO, READ BACK BY AND VERIFIED WITH: R RAKHIMOVA RN 2255 11/11/18 A BROWNING Performed at [facility], [place] [address] [place], [place] [address]    Report Status 11/11/2018 FINAL  Final  Expectorated sputum assessment w rflx to resp cult     Status: None   Collection Time: 11/12/18  3:31 AM  Result Value Ref Range Status   Specimen Description EXPECTORATED SPUTUM  Final   Special Requests NONE  Final   Sputum evaluation   Final    Sputum specimen not acceptable for testing.  Please recollect.   RESULT CALLED TO, READ BACK BY AND VERIFIED WITH: RALCHIMOVA RN 11/12/18 0605 JDW Performed at [facility] [address] [place], [place] [address]    Report Status 11/12/2018 FINAL  Final    Studies/Results: Mr Hand Right W Wo Contrast  Result Date: 11/12/2018 CLINICAL DATA:  48 year old female with 3 month history of bilateral hand swelling with intermittent  ulcerations. Patient developed blisters to the right hand and developed an ulcer with worsening erythema and pain of the right middle finger. EXAM: MRI OF THE RIGHT HAND WITHOUT AND WITH CONTRAST TECHNIQUE: Multiplanar, multisequence MR imaging of the right hand was performed before and after the administration of intravenous contrast. CONTRAST:  8 mL Gadavist IV COMPARISON:  11/07/2018 hand radiographs FINDINGS: Bones/Joint/Cartilage No marrow signal abnormality of the included hand and wrist. No  fracture, joint dislocation nor findings of osteomyelitis. No significant erosive change nor suspicious osseous lesions. Ligaments Intact. No thickening of the transverse ligament of the carpal tunnel. Muscles and Tendons Intact flexor and extensor tendons without evidence of tenosynovitis. Unremarkable carpal tunnel. Median nerve is unremarkable. No evidence of myositis or pyomyositis. Soft tissues Nonspecific subcutaneous soft tissue edema of the included dorsum of the hand consistent with cellulitis with extension of soft tissue edema involving the second through fourth digits, greatest along the middle finger. Drainable fluid collections are identified. IMPRESSION: 1. Findings in keeping cellulitis of the hand and in particular the second through fourth digits and more so involving the middle finger. No drainable fluid collections are identified to suggest abscess. 2. No evidence of osteomyelitis. Electronically Signed   By: Ashley Royalty M.D.   On: 11/12/2018 23:19   Dg Chest Port 1 View  Result Date: 11/14/2018 CLINICAL DATA:  Shortness of breath and cough EXAM: PORTABLE CHEST 1 VIEW COMPARISON:  Yesterday FINDINGS: Progression of left upper lobe airspace disease compatible with pneumonia. There is also history of hemoptysis and alveolar hemorrhage is possible. Porta catheter in good position. Normal heart size. IMPRESSION: Progressive left upper lobe airspace opacity which could be pneumonia or alveolar hemorrhage  based on the history. Electronically Signed   By: Monte Fantasia M.D.   On: 11/14/2018 08:56   Dg Chest Port 1 View  Result Date: 11/13/2018 CLINICAL DATA:  Shortness of breath EXAM: PORTABLE CHEST 1 VIEW COMPARISON:  Chest CT from 3 days ago FINDINGS: Small focus of airspace disease in the left lung that was not seen previously. Normal heart size and mediastinal contours. Right port in unremarkable position. IMPRESSION: Small airspace opacity on the left that was not seen on chest CT 3 days ago and consistent with an inflammatory process, presumably pneumonia. Electronically Signed   By: Monte Fantasia M.D.   On: 11/13/2018 09:35      Assessment/Plan:  INTERVAL HISTORY: As of time of this dictation patient is undergone bronchoscopy  Biopsy is nondiagnostic other than the suggestion of excoriation   Principal Problem:   Cellulitis of right finger Active Problems:   Hodgkin lymphoma ([place])   Breast cancer ([facility])   Thyroid cancer ([facility])   Adrenal insufficiency ([facility])   Hypertension   Hyperlipidemia   Edema   Right heart failure ([facility])   Nephrolithiasis   Oxygen dependent   Migraine   Hemoptysis   SOB (shortness of breath)    [name] is a 48 y.o. 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  #1  Skin ulcers of unknown cause: Punch biopsy suggest that these could be due to excoriation.  So far laboratory testing in the blood is been unrevealing.  #2 hemoptysis: She is now status post bronchoscopy and perhaps this may shed light on her acute diagnosis.  In particular we are curious whether she might have evidence of influenza by PCR from a deep specimen.  3.   Fevers:: Could be unifying autoimmune disease here or she may have a second process.  There is some concern about nosocomial influenza.  She has been started on Tamiflu.  Certainly if her PCR is positive I will convert this to treatment dose Tamiflu or if  somehow the respiratory panel was not sent for influenza I will also put her on Tamiflu  I will check in on her over the weekend.   LOS: 7 days   Alcide Evener 11/14/2018, 4:00 PM

## 2018-11-14 NOTE — Progress Notes (Signed)
Video Bronchoscopy done Not samples taken

## 2018-11-14 NOTE — Progress Notes (Signed)
Pt called for pain medication and benadryl. I let pt know that I had just medicated her for pain. And that she cannot have the benadryl again until 1230. Pt states "I feel so bad, like I have been run over by a truck:"

## 2018-11-15 ENCOUNTER — Inpatient Hospital Stay (HOSPITAL_COMMUNITY): Payer: BLUE CROSS/BLUE SHIELD

## 2018-11-15 DIAGNOSIS — J189 Pneumonia, unspecified organism: Secondary | ICD-10-CM

## 2018-11-15 LAB — COMPREHENSIVE METABOLIC PANEL
ALT: 25 U/L (ref 0–44)
AST: 26 U/L (ref 15–41)
Albumin: 2.9 g/dL — ABNORMAL LOW (ref 3.5–5.0)
Alkaline Phosphatase: 52 U/L (ref 38–126)
Anion gap: 10 (ref 5–15)
BUN: 13 mg/dL (ref 6–20)
CO2: 23 mmol/L (ref 22–32)
Calcium: 8.2 mg/dL — ABNORMAL LOW (ref 8.9–10.3)
Chloride: 103 mmol/L (ref 98–111)
Creatinine, Ser: 0.99 mg/dL (ref 0.44–1.00)
GFR calc Af Amer: >60 mL/min (ref >60)
GFR calc non Af Amer: >60 mL/min (ref >60)
Glucose, Bld: 115 mg/dL — ABNORMAL HIGH (ref 70–99)
Potassium: 3.5 mmol/L (ref 3.5–5.1)
Sodium: 136 mmol/L (ref 135–145)
Total Bilirubin: 0.4 mg/dL (ref 0.3–1.2)
Total Protein: 6.6 g/dL (ref 6.5–8.1)

## 2018-11-15 LAB — CBC WITH DIFFERENTIAL/PLATELET
ABS IMMATURE GRANULOCYTES: 0.05 10*3/uL (ref 0.00–0.07)
Basophils Absolute: 0 10*3/uL (ref 0.0–0.1)
Basophils Relative: 0 %
Eosinophils Absolute: 0.3 10*3/uL (ref 0.0–0.5)
Eosinophils Relative: 2 %
HCT: 33.4 % — ABNORMAL LOW (ref 36.0–46.0)
HEMOGLOBIN: 10.2 g/dL — AB (ref 12.0–15.0)
IMMATURE GRANULOCYTES: 0 %
Lymphocytes Relative: 13 %
Lymphs Abs: 1.7 10*3/uL (ref 0.7–4.0)
MCH: 28.7 pg (ref 26.0–34.0)
MCHC: 30.5 g/dL (ref 30.0–36.0)
MCV: 94.1 fL (ref 80.0–100.0)
Monocytes Absolute: 1.2 10*3/uL — ABNORMAL HIGH (ref 0.1–1.0)
Monocytes Relative: 10 %
NEUTROS ABS: 9.4 10*3/uL — AB (ref 1.7–7.7)
NEUTROS PCT: 75 %
Platelets: 246 10*3/uL (ref 150–400)
RBC: 3.55 MIL/uL — ABNORMAL LOW (ref 3.87–5.11)
RDW: 13.4 % (ref 11.5–15.5)
WBC: 12.7 10*3/uL — ABNORMAL HIGH (ref 4.0–10.5)
nRBC: 0 % (ref 0.0–0.2)

## 2018-11-15 LAB — RESPIRATORY PANEL BY PCR
Adenovirus: NOT DETECTED
Bordetella pertussis: NOT DETECTED
CORONAVIRUS 229E-RVPPCR: NOT DETECTED
Chlamydophila pneumoniae: NOT DETECTED
Coronavirus HKU1: NOT DETECTED
Coronavirus NL63: NOT DETECTED
Coronavirus OC43: NOT DETECTED
INFLUENZA A-RVPPCR: NOT DETECTED
Influenza B: NOT DETECTED
Metapneumovirus: NOT DETECTED
Mycoplasma pneumoniae: NOT DETECTED
Parainfluenza Virus 1: NOT DETECTED
Parainfluenza Virus 2: NOT DETECTED
Parainfluenza Virus 3: NOT DETECTED
Parainfluenza Virus 4: NOT DETECTED
Respiratory Syncytial Virus: NOT DETECTED
Rhinovirus / Enterovirus: NOT DETECTED

## 2018-11-15 LAB — HCV COMMENT:

## 2018-11-15 LAB — RHEUMATOID FACTOR: Rheumatoid fact SerPl-aCnc: 18.6 IU/mL — ABNORMAL HIGH (ref 0.0–13.9)

## 2018-11-15 LAB — ANTIEXTRACTABLE NUCLEAR AG
ENA SM Ab Ser-aCnc: <0.2 AI (ref 0.0–0.9)
Ribonucleic Protein: <0.2 AI (ref 0.0–0.9)

## 2018-11-15 LAB — MAGNESIUM: Magnesium: 2.3 mg/dL (ref 1.7–2.4)

## 2018-11-15 LAB — HEPATITIS C ANTIBODY (REFLEX): HCV Ab: <0.1 s/co ratio (ref 0.0–0.9)

## 2018-11-15 LAB — INFLUENZA PANEL BY PCR (TYPE A & B)
Influenza A By PCR: NEGATIVE
Influenza B By PCR: NEGATIVE

## 2018-11-15 LAB — PHOSPHORUS: Phosphorus: 2.4 mg/dL — ABNORMAL LOW (ref 2.5–4.6)

## 2018-11-15 MED ORDER — SODIUM CHLORIDE 0.9 % IV SOLN
1.0000 g | Freq: Three times a day (TID) | INTRAVENOUS | Status: DC
  Administered 2018-11-15 – 2018-11-17 (×7): 1 g via INTRAVENOUS
  Filled 2018-11-15 (×7): qty 1

## 2018-11-15 MED ORDER — OSELTAMIVIR PHOSPHATE 75 MG PO CAPS
75.0000 mg | ORAL_CAPSULE | Freq: Two times a day (BID) | ORAL | Status: DC
  Administered 2018-11-15 – 2018-11-17 (×5): 75 mg via ORAL
  Filled 2018-11-15 (×5): qty 1

## 2018-11-15 MED ORDER — GUAIFENESIN ER 600 MG PO TB12
1200.0000 mg | ORAL_TABLET | Freq: Two times a day (BID) | ORAL | Status: DC
  Administered 2018-11-15 – 2018-11-20 (×9): 1200 mg via ORAL
  Filled 2018-11-15 (×10): qty 2

## 2018-11-15 MED ORDER — IPRATROPIUM-ALBUTEROL 0.5-2.5 (3) MG/3ML IN SOLN
3.0000 mL | Freq: Four times a day (QID) | RESPIRATORY_TRACT | Status: DC
  Administered 2018-11-15 (×2): 3 mL via RESPIRATORY_TRACT
  Filled 2018-11-15 (×2): qty 3

## 2018-11-15 MED ORDER — POTASSIUM CHLORIDE CRYS ER 20 MEQ PO TBCR
40.0000 meq | EXTENDED_RELEASE_TABLET | Freq: Two times a day (BID) | ORAL | Status: AC
  Administered 2018-11-15 (×2): 40 meq via ORAL
  Filled 2018-11-15 (×2): qty 2

## 2018-11-15 MED ORDER — K PHOS MONO-SOD PHOS DI & MONO 155-852-130 MG PO TABS
500.0000 mg | ORAL_TABLET | Freq: Two times a day (BID) | ORAL | Status: AC
  Administered 2018-11-15: 500 mg via ORAL
  Filled 2018-11-15 (×2): qty 2

## 2018-11-15 MED ORDER — PHENOL 1.4 % MT LIQD
1.0000 | OROMUCOSAL | Status: DC | PRN
  Administered 2018-11-15: 1 via OROMUCOSAL
  Filled 2018-11-15: qty 177

## 2018-11-15 MED ORDER — METRONIDAZOLE IN NACL 5-0.79 MG/ML-% IV SOLN
500.0000 mg | Freq: Three times a day (TID) | INTRAVENOUS | Status: DC
  Administered 2018-11-15 – 2018-11-20 (×14): 500 mg via INTRAVENOUS
  Filled 2018-11-15 (×17): qty 100

## 2018-11-15 NOTE — Progress Notes (Signed)
Pt requesting not to be woken up for neb tx. Advised pt to have RN notify for RT if she felt she needed her treatment throughout the night. RT Will continue to monitor.

## 2018-11-15 NOTE — Progress Notes (Signed)
Pt noted to have decreased BP, pulse increased. Called Rapid response nurse, she is on her way to the floor. Pt is able to stand and walk, alert. Noted to be disoriented to time this morning. Pt is forgetful, which  Is abnormal for her.

## 2018-11-15 NOTE — Progress Notes (Signed)
Subjective:  Is coughing quite a bit when I saw her in the room early this morning  Antibiotics:  Anti-infectives (From admission, onward)   Start     Dose/Rate Route Frequency Ordered Stop   11/15/18 1030  ceFEPIme (MAXIPIME) 1 g in sodium chloride 0.9 % 100 mL IVPB     1 g 200 mL/hr over 30 Minutes Intravenous Every 8 hours 11/15/18 1008     11/15/18 1000  oseltamivir (TAMIFLU) capsule 75 mg     75 mg Oral 2 times daily 11/15/18 0849     11/15/18 0900  metroNIDAZOLE (FLAGYL) IVPB 500 mg     500 mg 100 mL/hr over 60 Minutes Intravenous Every 8 hours 11/15/18 0849     11/14/18 1000  oseltamivir (TAMIFLU) capsule 75 mg  Status:  Discontinued     75 mg Oral Daily 11/14/18 0833 11/15/18 0849   11/12/18 2200  ceFAZolin (ANCEF) IVPB 1 g/50 mL premix  Status:  Discontinued     1 g 100 mL/hr over 30 Minutes Intravenous Every 8 hours 11/12/18 1841 11/15/18 0849   11/12/18 1930  ceFAZolin (ANCEF) IVPB 1 g/50 mL premix  Status:  Discontinued     1 g 100 mL/hr over 30 Minutes Intravenous Every 8 hours 11/12/18 1838 11/12/18 1841   11/11/18 1400  clindamycin (CLEOCIN) IVPB 600 mg  Status:  Discontinued     600 mg 100 mL/hr over 30 Minutes Intravenous Every 8 hours 11/11/18 1014 11/12/18 1814   11/10/18 1645  doxycycline (VIBRA-TABS) tablet 100 mg  Status:  Discontinued     100 mg Oral Every 12 hours 11/10/18 1641 11/11/18 1014   11/08/18 1000  vancomycin (VANCOCIN) IVPB 1000 mg/200 mL premix  Status:  Discontinued     1,000 mg 200 mL/hr over 60 Minutes Intravenous Every 12 hours 11/07/18 2115 11/09/18 1545   11/07/18 2130  vancomycin (VANCOCIN) IVPB 750 mg/150 ml premix     750 mg 150 mL/hr over 60 Minutes Intravenous  Once 11/07/18 2110 11/07/18 2310   11/07/18 2000  vancomycin (VANCOCIN) IVPB 1000 mg/200 mL premix     1,000 mg 200 mL/hr over 60 Minutes Intravenous  Once 11/07/18 1955 11/07/18 2117      Medications: Scheduled Meds: . budesonide  0.5 mg Inhalation BID  .  clonazePAM  1 mg Oral TID  . escitalopram  20 mg Oral Daily  . estradiol  2 mg Oral Daily  . fluticasone furoate-vilanterol  1 puff Inhalation Daily  . furosemide  40 mg Oral BID  . guaiFENesin  1,200 mg Oral BID  . hydrocortisone  10 mg Oral QPC lunch  . hydrocortisone  20 mg Oral Q breakfast  . ipratropium-albuterol  3 mL Nebulization Q6H  . levothyroxine  112 mcg Oral QAC breakfast  . metoprolol succinate  37.5 mg Oral Daily  . oseltamivir  75 mg Oral BID  . phosphorus  500 mg Oral BID  . potassium chloride  40 mEq Oral BID  . potassium citrate  10 mEq Oral Daily  . rosuvastatin  10 mg Oral Daily  . sodium chloride flush  10-40 mL Intracatheter Q12H  . topiramate  150 mg Oral QHS   Continuous Infusions: . sodium chloride 10 mL/hr at 11/15/18 0553  . ceFEPime (MAXIPIME) IV    . metronidazole 500 mg (11/15/18 1036)   PRN Meds:.acetaminophen, cyclobenzaprine, diphenhydrAMINE, fentaNYL (SUBLIMAZE) injection, HYDROmorphone (DILAUDID) injection, midazolam, phenol, sodium chloride flush, sodium chloride flush, sucralfate, traMADol,  zolpidem    Objective: Weight change:   Intake/Output Summary (Last 24 hours) at 11/15/2018 1145 Last data filed at 11/15/2018 1018 Gross per 24 hour  Intake 853.15 ml  Output -  Net 853.15 ml   Blood pressure 100/76, pulse (!) 107, temperature 99 F (37.2 C), temperature source Oral, resp. rate 16, height _0  (1.651 m), weight 80 kg, SpO2 92 %. Temp:  [98.7 F (37.1 C)-99 F (37.2 C)] 99 F (37.2 C) (02/15 0526) Pulse Rate:  [87-107] 107 (02/15 0826) Resp:  [11-20] 16 (02/15 0428) BP: (93-118)/(60-89) 100/76 (02/15 0826) SpO2:  [89 %-100 %] 92 % (02/15 0855)  Physical Exam: General: Alert and awake, oriented x3, not in any acute distress. HEENT: anicteric sclera, EOMI CVS regular rate, normal  Chest: Rhonchorous airway sounds throughout her lungs especially posteriorly Abdomen: soft non-distended,  Extremities: no edema or deformity  noted bilaterally  Neuro: nonfocal  Skin exam 11/14/2018:    Biopsy site was clean   CBC:    BMET Recent Labs    11/14/18 0403 11/15/18 0500  NA 136 136  K 3.1* 3.5  CL 103 103  CO2 23 23  GLUCOSE 121* 115*  BUN 11 13  CREATININE 0.91 0.99  CALCIUM 8.5* 8.2*     Liver Panel  Recent Labs    11/14/18 0403 11/15/18 0500  PROT 6.7 6.6  ALBUMIN 3.2* 2.9*  AST 28 26  ALT 30 25  ALKPHOS 50 52  BILITOT 0.8 0.4       Sedimentation Rate Recent Labs    11/13/18 0005  ESRSEDRATE 18   C-Reactive Protein Recent Labs    11/13/18 0005  CRP 1.3*    Micro Results: Recent Results (from the past 720 hour(s))  Expectorated sputum assessment w rflx to resp cult     Status: None   Collection Time: 11/11/18  3:21 PM  Result Value Ref Range Status   Specimen Description EXPECTORATED SPUTUM  Final   Special Requests NONE  Final   Sputum evaluation   Final    Sputum specimen not acceptable for testing.  Please recollect.   RESULT CALLED TO, READ BACK BY AND VERIFIED WITH: R RAKHIMOVA RN 2255 11/11/18 A BROWNING Performed at [facility], [place] [address], [place], [place] [address]    Report Status 11/11/2018 FINAL  Final  Expectorated sputum assessment w rflx to resp cult     Status: None   Collection Time: 11/12/18  3:31 AM  Result Value Ref Range Status   Specimen Description EXPECTORATED SPUTUM  Final   Special Requests NONE  Final   Sputum evaluation   Final    Sputum specimen not acceptable for testing.  Please recollect.   RESULT CALLED TO, READ BACK BY AND VERIFIED WITH: RALCHIMOVA RN 11/12/18 0605 JDW Performed at [facility] [address], [place], [place] [address]    Report Status 11/12/2018 FINAL  Final  Culture, blood (routine x 2)     Status: None (Preliminary result)   Collection Time: 11/14/18  9:03 AM  Result Value Ref Range Status   Specimen Description BLOOD LEFT THUMB  Final   Special Requests   Final    BOTTLES DRAWN  AEROBIC ONLY Blood Culture adequate volume   Culture   Final    NO GROWTH < 24 HOURS Performed at [facility], [place] [address], [place],  [address]    Report Status PENDING  Incomplete  Respiratory Panel by PCR  Status: None   Collection Time: 11/15/18  8:20 AM  Result Value Ref Range Status   Adenovirus NOT DETECTED NOT DETECTED Final   Coronavirus 229E NOT DETECTED NOT DETECTED Final    Comment: (NOTE) The Coronavirus on the Respiratory Panel, DOES NOT test for the novel  Coronavirus (2019 nCoV)    Coronavirus HKU1 NOT DETECTED NOT DETECTED Final   Coronavirus NL63 NOT DETECTED NOT DETECTED Final   Coronavirus OC43 NOT DETECTED NOT DETECTED Final   Metapneumovirus NOT DETECTED NOT DETECTED Final   Rhinovirus / Enterovirus NOT DETECTED NOT DETECTED Final   Influenza A NOT DETECTED NOT DETECTED Final   Influenza B NOT DETECTED NOT DETECTED Final   Parainfluenza Virus 1 NOT DETECTED NOT DETECTED Final   Parainfluenza Virus 2 NOT DETECTED NOT DETECTED Final   Parainfluenza Virus 3 NOT DETECTED NOT DETECTED Final   Parainfluenza Virus 4 NOT DETECTED NOT DETECTED Final   Respiratory Syncytial Virus NOT DETECTED NOT DETECTED Final   Bordetella pertussis NOT DETECTED NOT DETECTED Final   Chlamydophila pneumoniae NOT DETECTED NOT DETECTED Final   Mycoplasma pneumoniae NOT DETECTED NOT DETECTED Final    Comment: Performed at [facility], [address] [place], [place] [address]    Studies/Results: Dg Chest Port 1 View  Result Date: 11/14/2018 CLINICAL DATA:  Shortness of breath and cough EXAM: PORTABLE CHEST 1 VIEW COMPARISON:  Yesterday FINDINGS: Progression of left upper lobe airspace disease compatible with pneumonia. There is also history of hemoptysis and alveolar hemorrhage is possible. Porta catheter in good position. Normal heart size. IMPRESSION: Progressive left upper lobe airspace opacity which could be pneumonia or alveolar hemorrhage based on the  history. Electronically Signed   By: Monte Fantasia M.D.   On: 11/14/2018 08:56      Assessment/Plan:  INTERVAL HISTORY: Bronchoscopy could not be performed due to problems with not sufficient sedation.  Patient is also now found on chest x-ray to have evidence of pneumonia.  Principal Problem:   Cellulitis of right middle finger Active Problems:   Hodgkin lymphoma ([facility])   Breast cancer ([facility])   Thyroid cancer ([facility])   Adrenal insufficiency ([facility])   Hypertension   Hyperlipidemia   Edema   Right heart failure ([facility])   Nephrolithiasis   Oxygen dependent   Migraine   Hemoptysis   SOB (shortness of breath)   FUO (fever of unknown origin)    [name] is a 48 y.o. 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   #1  Hemoptysis, pneumonia:  Has an acute process that seems superimposed on her chronic lung disease.  Bronchoscopy could not be performed yesterday and she is coughing more vigorously chest x-ray shows an infiltrate.  He also apparently became hypotensive this morning as well.  We will broaden her antimicrobial coverage to cover gram-negative and anaerobes with cefepime and metronidazole  We will put her on treatment for influenza with Tamiflu  Would have low threshold to move her to a rest of unit or critical care unit.  Hopefully bronchoscopy can still be accomplished early in the week.  She is going to need general anesthesia if I understand correctly.  Still have concerned about possibility of autoimmune process though again serological work-up is  not been very helpful yet   #2 Skin ulcers of unknown cause: Punch biopsy suggest that these could be  due to excoriation.  So far laboratory testing in the blood is been unrevealing.      LOS: 8 days   Alcide Evener 11/15/2018, 11:45 AM

## 2018-11-15 NOTE — Progress Notes (Signed)
PROGRESS NOTE    [name]  BPZ:[id number] DOB: 10-17-1970 DOA: 11/07/2018 PCP: Su Grand, MD   Brief Narrative:  [name] is a 48 y.o. 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   She was first evaluated for blisters which developed into ulcerations of both hands by her PCP 08/2018.  At that time she was treated with one-time dose of Rocephin in the office followed by Keflex.  Due to subjective fevers at that time she was evaluated in the ED and discharged on clindamycin x 11days, left upper extremity ultrasound at that time showed associated superficial occlusive thrombus of left basilic vein with no DVT. ED evaluation at that time also included CTA negative for PE  Since then ambulatory referral to Rheumatology was placed by her primary oncologist. She has had persistent swelling in both hands without any new ulcerations until a few weeks ago with this current presentation.  A week ago noticed right middle finger blisters in middle of finger. It rsolved on its own and she covered with neosporin and dressed for a few days then it turned black with some purulent drainage. Then started noticing redness, numbness x 2 days. Pain extending in to hand during that time as  well.  E visit yesterday---who advised being seen by provider face to face to rule out systemic infection. Denies fevers, chills. Chronic nausea. No vomiting, abd pain, no history of trauma/injury to hand. No other ulcers. No joint pain or aches. No dysuria, no diarrhea/constipation  This a.m. she had 2 episodes of small-volume hemoptysis while coughing.  Reports some increased shortness of breath.  No fevers or chills.  Also shows new small lesions on left wrist and left forearm and right forearm..  **ID, Pulmonary and Hand Surgery were consulted and workup still pending. She was attempted to undergo Bronchoscopy on 2/14 at 2:00 pm but per my Conversation with Dr. ELoanne Drillingwas unable to due to tolerance. Flu PCR is Negative x2 but ID recommending treatment dosing. ID recommended General Surgery to biopsy her skin ulcer on her arm and current feeling is that it is 2/2 to a Vasculitic Process. She spiked a temperature overnight on 2/12-2/12 and so Blood Cx were ordered but never drawn so they were ordered again. Still feels bad and had a higher fever yesterday AM and is coughing and feeling more congested. CXR shows worsened LUL consolidation so Abx were broadened to IV Cefepime and IV Metronidazole.  Assessment & Plan:   Principal Problem:   Cellulitis of right middle finger Active Problems:   Hodgkin lymphoma (HCC)   Breast cancer (HCC)   Thyroid cancer (HCC)   Adrenal insufficiency (HCC)   Hypertension   Hyperlipidemia   Edema   Right heart failure (HCC)   Nephrolithiasis   Oxygen dependent   Migraine   Hemoptysis   SOB (shortness of breath)   FUO (  fever of unknown origin)  Right third finger ulceration, with Cellulitis  -X-ray imaging shows involvement of subcutaneous tissue with no obvious involvement with bone.   -Dr. Amedeo Plenty with surgery following no signs of abscess/compartment syndrome or lymphangitis.   -Given she had no sepsis physiology and no obvious worsening erythema she was  transitioned to doxycycline on 2/10 but now has new lesions on left wrist and forearm and right forearm (slight abrasions with some surrounding erythema).   -Discontinued Doxycycline and started Clindamycin since she has done well with this in the past (prior presentation at [facility] during November 2019)and closely monitor over next 24 hours to ensure no fevers or worsening clinical status but discussed with ID Dr. Tommy Medal who recommended changing to Cefazolin and obtaining an MRI of the Hand -MRI of Right Hand ordered along with Inflammatory Markers and (ESR was 18 and CRP was 1.3) and along with ANCA titers, Rheumatoid Factor; ID added other Autoimmune studies including Anti-phospholidid Ab and SSA/SSB -Also obtaining UDS  -TTE negative for vegetation, no DVT in R arm, no clot in subclavian.  -Blood cultures ordered because she became febrile at 100.8 but patient refused them -IV Abx changed from Cefazolin now to IV Cefepime and Metronidazole  -WBC also elevated to 14,000 and worsened to 17,100 and now is trending back down 12,700  Left Upper Lobe Pneumonia -Blood cultures ordered because she became febrile at 100.8 but patient refused them when she spiked a tempreature but did allow [place] to get a Left Thumb Blood Cx -WBC also elevated to 14,000 and worsened to 17,100 and now is trending back down 12,700 -Further Abx Recommendations per ID; Influenza Negative via PCR but ID feels as if this may be a False positive and ID recommends that if she goes for Bronchoscopy to send a new PCR from BAL for Influenza; Unable to Do Bronchoscopy and repeat Influenza Panel Negative -Respiratory Virus Panel Negative  -She has been started on prophylatic Influenza Dosing due to concern of Nosocomial Spread as her neighbors in the hospital have the Flu but this was changed to Treatment dosing per ID Recommendations  -Will get Strep A Cx -Continue to Monitor and Further Abx treatment course per ID -CXR this AM showed  Left upper lobe consolidation is stable from the previous day's exam. No new lung abnormalities. No pneumothorax or pleural effusion. -Broadened Abx as above; Started Flutter Valve, Incentive Spirometry, Guaifenesin  -C/w Budesonide 0.5 mg Neb BID and Breo-Ellipta  Small volume hemoptysis episodes -Becoming more frequent.  Occurring with cough.   -CT chest negative for any new masses, chronic nodule shown.   -Sending for culture to assess for potential infection given some diminished breath sounds in left bases.  -CXR showed Small airspace opacity on the left that was not seen on chest CT 3 days ago and consistent with an inflammatory process, presumably pneumonia. -Consulted Pulmonary for further evaluation and recommendations and defer to them for further workup -Pulmonary planning on doing a Bronchoscopy and this was attempted today but patient stated she had problem with sedation and usually requires deep sedation with Propofol but it was attempted and then aborted. Dr. Cordelia Pen partner to follow up this weekend and ? If Bronchosopy is going to be rescheduled next week under Anesthesia -Per my conversation with Dr. Hale Bogus she feels as if the patient's Hempotypsis could be 2/2 to Nose bleeding but ? Related to this LUL Pneumonia -Continue to Monitor   Subacute history of blisters developing into ulcers and  bilateral hand and arm swelling.   -Ongoing for 3 months.  Is unclear if this is related to some autoimmune process (has history of Raynaud's phenomenon).   -Increased Lasix to 40 mg BID and swelling is improving  -CCP was obtained on 08/2018 at ED vist and was unremarkable.   -HIV and ANA negative here. Will obtain ANCA Titers, Rhuematoid Factor, ESR and CRP and also obtain a UDS; As above -RA Latex Turbid was 11.1 and repeat was 18.6 -Unclear family history as patient is adopted, does not have any other systemic symptoms (no joint aches, no joint pain,). -CT chest was negative for any  new metastatic lesions or any lymphadenopathy  -Dr. Tommy Medal reached out to Dr. Amil Amen of Rheumatology who may offer more insight -D/C'd IV Cefazolin and and escalated Abx to IV Cefepime and Metronidazole  -General Surgery consulted for Biopsy of skin ulceration -Follow up on studies ordered   -Will likely need to discuss with Hematology/Oncology given her previous Hx of Breast Cancer and Hodgkins Lymphoma about the possiblity of Paraneoplastic syndromes  -Has an outpatient Rheumatology referral scheduled in March   Adrenal Insufficiency secondary to pituitary resection for adenoma (2002, status post gamma knife surgery for regrowth of tumor in 2015).   -Discontinued IV hydrocortisone and transitioned to oral stress dosing 20 mg of hydrocortisone x3 days followed by reinitiation of her home regimen 10 mg in the a.m. 5 mg in p.m. 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   -Status post 14-day treatment Lovenox by oncologist in 08/2018 -Continue to Monitor   Chronic hypoxic respiratory failure, stable on home 3 L nasal cannula.  ? 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   -Followed by [facility] pulmonology.  Continue home Breo Pulmicort BIDadded here.  Albuterol PRN and scheduled DuoNeb -Pulmonary Folllowing and planning on doing Bronchoscopy  -Per [facility] Notes Dr. Geraldo Docker:  "She has already had much of the work-up for this (CT chest, PFTs, TTE, RHC--note she does not have PAH based on her RHC with wedge 16 and PA mean 20.) though we have not objectively confirmed it in our own clinic. A low sat was documented in the cancer center once early on (at rest) but she has not made  it to a 6MWT and her CPET in 2017 was very normal in terms of oxygenation. She cannot walk aggressively enough for a 6MWT now given her leg weakness. She thinks she can exercise vigorously on a bike and wants to repeat a CPET. This will also provide info about [place] cardiac [place] - CPET on room air" -Patient never presented for CPET  History of Aortic Stenosis.  -Documented in [facility] Chart. Systolic murmur heard on exam. Last TTE at [facility] on 08/2018 showed no stenosis.  -No vegetation on TTE here, does have tricupsid valve structure/calcification moderate.   Chronic Nausea.   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   -Followed by Dr. Wendee Beavers in [place]  Stage IV Hodgkin's disease -Diagnosed at age 71.  Status post chemotherapy and mantle field radiotherapy  GERD -C/w Dexilant substitution   HLD,stable -C/w Home Rosuvastatin 10 mg po daily   Post-Surgical Hypothyroidism.  -TSH suppressed but not accurate given prior pituitary resection.  -Follow free T4.  -Continue home synthroid at current dose of 112 mcg   History of CHF with reduced EF. -Last TTE 08/2018 showed normal systolic and diastolic function. BNP here unremarkable.  -Continued home lasix of 20 mg po BID but now increased to 40 mg BID  -Strict I's/O's and Daily Weights; She is +5.341 Liters since admission; Weight's Not done  Normocytic Anemia  -Patient's Hb/Hct went from 12.1/39.1 -> 11.1/36.3 -> 11.2/36.5 -> 11.1/34.9 -> 10.2/33.4 -Check Anemia Panel -Continue to Monitor for S/Sx for Bleeding -Repeat CBC in AM   Hypokalemia -Patient's potassium this morning was 3.5 -Replete with p.o. KCl 40 mEq BID x2 -Also replete with K-Phos 500 mg p.o. BID x 2  Doses -Continue to monitor and replete as necessary -Repeat CMP in a.m.  Hypophosphatemia -Patient's phosphorus level is currently 2.4 -Replete with p.o. K-Phos 500 milligrams p.o. BID x 2 doses   -Continue to monitor and replete as necessary -Repeat phosphorus level in a.m.  DVT prophylaxis: Lovenox Held due to hemoptysis  Code Status: FULL CODE Family Communication: No family present at bedside  Disposition Plan: Remain Inpatient for further workup and evaluation   Consultants:   Pulmonary  Infectious Diseases  Hand Surgery   General Surgery   Procedures:   None  Antimicrobials:  Anti-infectives (From admission, onward)   Start     Dose/Rate Route Frequency Ordered Stop   11/15/18 1030  ceFEPIme (MAXIPIME) 1 g in sodium chloride 0.9 % 100 mL IVPB     1 g 200 mL/hr over 30 Minutes Intravenous Every 8 hours 11/15/18 1008     11/15/18 1000  oseltamivir (TAMIFLU) capsule 75 mg     75 mg Oral 2 times daily 11/15/18 0849     11/15/18 0900  metroNIDAZOLE (FLAGYL) IVPB 500 mg     500 mg 100 mL/hr over 60 Minutes Intravenous Every 8 hours 11/15/18 0849     11/14/18 1000  oseltamivir (TAMIFLU) capsule 75 mg  Status:  Discontinued     75 mg Oral Daily 11/14/18 0833 11/15/18 0849   11/12/18 2200  ceFAZolin (ANCEF) IVPB 1 g/50 mL premix  Status:  Discontinued     1 g 100 mL/hr over 30 Minutes Intravenous Every 8 hours 11/12/18 1841 11/15/18 0849   11/12/18 1930  ceFAZolin (ANCEF) IVPB 1 g/50 mL premix  Status:  Discontinued     1 g 100 mL/hr over 30 Minutes Intravenous Every 8 hours 11/12/18 1838 11/12/18 1841   11/11/18 1400  clindamycin (CLEOCIN) IVPB 600 mg  Status:  Discontinued     600 mg 100 mL/hr over 30 Minutes Intravenous Every 8 hours 11/11/18 1014 11/12/18 1814   11/10/18 1645  doxycycline (VIBRA-TABS) tablet 100 mg  Status:  Discontinued     100 mg Oral Every 12 hours 11/10/18 1641 11/11/18 1014   11/08/18 1000  vancomycin (VANCOCIN) IVPB 1000 mg/200 mL premix   Status:  Discontinued     1,000 mg 200 mL/hr over 60 Minutes Intravenous Every 12 hours 11/07/18 2115 11/09/18 1545   11/07/18 2130  vancomycin (VANCOCIN) IVPB 750 mg/150 ml premix     750 mg 150 mL/hr over 60 Minutes Intravenous  Once 11/07/18 2110 11/07/18 2310   11/07/18 2000  vancomycin (VANCOCIN) IVPB 1000 mg/200 mL premix  1,000 mg 200 mL/hr over 60 Minutes Intravenous  Once 11/07/18 1955 11/07/18 2117     Subjective: Seen and examined at bedside and stated that she felt worse this morning and had a lot of congestion. Bedside nurse called a rapid response because the patient was "disoriented" however she improved significantly after breathing treatment and was oriented to time and place and blood pressure had dropped slightly but she was maintaining her map.  After she received a DuoNeb and incentive spirometer as well as flutter valve she improved.  Chest x-ray confirms left upper lobe pneumonia.  She is improved from this morning is still feels better but her numbers are trending down and so is her swelling in her arms.  Objective: Vitals:   11/15/18 0518 11/15/18 0526 11/15/18 0826 11/15/18 0855  BP: 106/70  100/76   Pulse: 93  (!) 107   Resp:      Temp:  99 F (37.2 C)    TempSrc:  Oral    SpO2:   (!) 89% 92%  Weight:      Height:        Intake/Output Summary (Last 24 hours) at 11/15/2018 1444 Last data filed at 11/15/2018 1303 Gross per 24 hour  Intake 913.15 ml  Output -  Net 913.15 ml   Filed Weights   11/07/18 1635  Weight: 80 kg   Examination: Physical Exam:  Constitutional: Well-nourished, well-developed overweight Caucasian female who appears uncomfortable and fatigued Eyes: Conjunctive are normal.  Sclera anicteric ENMT: External ears and nose appear normal.  Grossly normal hearing Neck: Appears supple no JVD Respiratory: Diminished to auscultation bilaterally but more so on the left lung side and has some rhonchi in the left upper lobe.  No  appreciable accessory muscle usage and she had temporarily removed her supplemental oxygen again this morning.  Appears to have unlabored breathing now Cardiovascular:   Regular rate and rhythm.  No appreciable murmurs, rubs, gallops Abdomen: Soft, nontender, distended secondary body habitus.  Bowel sounds present GU: Deferred Musculoskeletal: No contractures or cyanosis.  No joint deformities Skin: Skin is warm and dry no appreciable rashes but does have upper extremity edema which is improving and she has some blistering with a swollen middle finger and a few blisters on the right arm left arm excoriations.  No significant erythema noted Neurologic: Cranial nerves II through XII grossly intact with no appreciable focal deficits Psychiatric: Depressed appearing on the flat affect again.  Intact judgment and insight  Data Reviewed: I have personally reviewed following labs and imaging studies  CBC: Recent Labs  Lab 11/10/18 0937 11/12/18 0922 11/13/18 0512 11/14/18 0403 11/15/18 0500  WBC 6.9 7.0 14.0* 17.1* 12.7*  NEUTROABS  --  4.3 10.8* 12.3* 9.4*  HGB 10.8* 11.1* 11.2* 11.1* 10.2*  HCT 35.7* 36.3 36.5 34.9* 33.4*  MCV 93.7 92.6 91.7 92.3 94.1  PLT 249 258 268 250 935   Basic Metabolic Panel: Recent Labs  Lab 11/12/18 0922 11/13/18 0512 11/14/18 0403 11/15/18 0500  NA 140 135 136 136  K 3.5 3.6 3.1* 3.5  CL 104 101 103 103  CO2 _0 GLUCOSE 123* 130* 121* 115*  BUN _1 CREATININE 1.04* 1.03* 0.91 0.99  CALCIUM 8.6* 8.9 8.5* 8.2*  MG 2.0 1.8 2.0 2.3  PHOS 4.3 3.0 2.1* 2.4*   GFR: Estimated Creatinine Clearance: 73.4 mL/min (by C-G formula based on SCr of 0.99 mg/dL). Liver Function Tests: Recent Labs  Lab 11/12/18  7858 11/13/18 0512 11/14/18 0403 11/15/18 0500  AST _0 ALT 32 _1 ALKPHOS 39 44 50 52  BILITOT 0.3 0.6 0.8 0.4  PROT 6.0* 6.5 6.7 6.6  ALBUMIN 3.2* 3.3* 3.2* 2.9*   No results for input(s): LIPASE, AMYLASE in the  last 168 hours. No results for input(s): AMMONIA in the last 168 hours. Coagulation Profile: No results for input(s): INR, PROTIME in the last 168 hours. Cardiac Enzymes: No results for input(s): CKTOTAL, CKMB, CKMBINDEX, TROPONINI in the last 168 hours. BNP (last 3 results) No results for input(s): PROBNP in the last 8760 hours. HbA1C: No results for input(s): HGBA1C in the last 72 hours. CBG: Recent Labs  Lab 11/12/18 1216 11/13/18 1116  GLUCAP 94 112*   Lipid Profile: No results for input(s): CHOL, HDL, LDLCALC, TRIG, CHOLHDL, LDLDIRECT in the last 72 hours. Thyroid Function Tests: No results for input(s): TSH, T4TOTAL, FREET4, T3FREE, THYROIDAB in the last 72 hours. Anemia Panel: No results for input(s): VITAMINB12, FOLATE, FERRITIN, TIBC, IRON, RETICCTPCT in the last 72 hours. Sepsis Labs: No results for input(s): PROCALCITON, LATICACIDVEN in the last 168 hours.  Recent Results (from the past 240 hour(s))  Expectorated sputum assessment w rflx to resp cult     Status: None   Collection Time: 11/11/18  3:21 PM  Result Value Ref Range Status   Specimen Description EXPECTORATED SPUTUM  Final   Special Requests NONE  Final   Sputum evaluation   Final    Sputum specimen not acceptable for testing.  Please recollect.   RESULT CALLED TO, READ BACK BY AND VERIFIED WITH: R RAKHIMOVA RN 2255 11/11/18 A BROWNING Performed at [facility], [place] [address], [place], [place] [address]    Report Status 11/11/2018 FINAL  Final  Expectorated sputum assessment w rflx to resp cult     Status: None   Collection Time: 11/12/18  3:31 AM  Result Value Ref Range Status   Specimen Description EXPECTORATED SPUTUM  Final   Special Requests NONE  Final   Sputum evaluation   Final    Sputum specimen not acceptable for testing.  Please recollect.   RESULT CALLED TO, READ BACK BY AND VERIFIED WITH: RALCHIMOVA RN 11/12/18 0605 JDW Performed at [facility] [address]  [place], [place] [address]    Report Status 11/12/2018 FINAL  Final  Culture, blood (routine x 2)     Status: None (Preliminary result)   Collection Time: 11/14/18  9:03 AM  Result Value Ref Range Status   Specimen Description BLOOD LEFT THUMB  Final   Special Requests   Final    BOTTLES DRAWN AEROBIC ONLY Blood Culture adequate volume   Culture   Final    NO GROWTH 1 DAY Performed at [facility], [place] [address] [place], [place] [address]    Report Status PENDING  Incomplete  Respiratory Panel by PCR     Status: None   Collection Time: 11/15/18  8:20 AM  Result Value Ref Range Status   Adenovirus NOT DETECTED NOT DETECTED Final   Coronavirus 229E NOT DETECTED NOT DETECTED Final    Comment: (NOTE) The Coronavirus on the Respiratory Panel, DOES NOT test for the novel  Coronavirus (2019 nCoV)    Coronavirus HKU1 NOT DETECTED NOT DETECTED Final   Coronavirus NL63 NOT DETECTED NOT DETECTED Final   Coronavirus OC43 NOT DETECTED NOT DETECTED Final   Metapneumovirus NOT DETECTED NOT DETECTED Final  Rhinovirus / Enterovirus NOT DETECTED NOT DETECTED Final   Influenza A NOT DETECTED NOT DETECTED Final   Influenza B NOT DETECTED NOT DETECTED Final   Parainfluenza Virus 1 NOT DETECTED NOT DETECTED Final   Parainfluenza Virus 2 NOT DETECTED NOT DETECTED Final   Parainfluenza Virus 3 NOT DETECTED NOT DETECTED Final   Parainfluenza Virus 4 NOT DETECTED NOT DETECTED Final   Respiratory Syncytial Virus NOT DETECTED NOT DETECTED Final   Bordetella pertussis NOT DETECTED NOT DETECTED Final   Chlamydophila pneumoniae NOT DETECTED NOT DETECTED Final   Mycoplasma pneumoniae NOT DETECTED NOT DETECTED Final    Comment: Performed at [facility], [place] [address] [place], [place] [address]    Radiology Studies: Dg Chest Port 1 View  Result Date: 11/15/2018 CLINICAL DATA:  Shortness of breath today. EXAM: PORTABLE CHEST 1 VIEW COMPARISON:  11/14/2018 and older exams. FINDINGS: Left upper  lobe consolidation is stable from the previous day's exam. No new lung abnormalities. No pneumothorax or pleural effusion. Stable right-sided Port-A-Cath. IMPRESSION: 1. No change from the previous day's study. 2. Left upper lobe consolidation which has worsened when compared to a chest radiograph dated 11/13/2018. Electronically Signed   By: Lajean Manes M.D.   On: 11/15/2018 13:48   Dg Chest Port 1 View  Result Date: 11/14/2018 CLINICAL DATA:  Shortness of breath and cough EXAM: PORTABLE CHEST 1 VIEW COMPARISON:  Yesterday FINDINGS: Progression of left upper lobe airspace disease compatible with pneumonia. There is also history of hemoptysis and alveolar hemorrhage is possible. Porta catheter in good position. Normal heart size. IMPRESSION: Progressive left upper lobe airspace opacity which could be pneumonia or alveolar hemorrhage based on the history. Electronically Signed   By: Monte Fantasia M.D.   On: 11/14/2018 08:56    Scheduled Meds: . budesonide  0.5 mg Inhalation BID  . clonazePAM  1 mg Oral TID  . escitalopram  20 mg Oral Daily  . estradiol  2 mg Oral Daily  . fluticasone furoate-vilanterol  1 puff Inhalation Daily  . furosemide  40 mg Oral BID  . guaiFENesin  1,200 mg Oral BID  . hydrocortisone  10 mg Oral QPC lunch  . hydrocortisone  20 mg Oral Q breakfast  . ipratropium-albuterol  3 mL Nebulization Q6H  . levothyroxine  112 mcg Oral QAC breakfast  . metoprolol succinate  37.5 mg Oral Daily  . oseltamivir  75 mg Oral BID  . phosphorus  500 mg Oral BID  . potassium chloride  40 mEq Oral BID  . potassium citrate  10 mEq Oral Daily  . rosuvastatin  10 mg Oral Daily  . sodium chloride flush  10-40 mL Intracatheter Q12H  . topiramate  150 mg Oral QHS   Continuous Infusions: . sodium chloride 10 mL/hr at 11/15/18 0553  . ceFEPime (MAXIPIME) IV 1 g (11/15/18 1216)  . metronidazole 500 mg (11/15/18 1036)    LOS: 8 days   Kerney Elbe, DO [facility] PAGER is  on [place]  If 7PM-7AM, please contact night-coverage [email or web address] Password [facility] 11/15/2018, 2:44 PM

## 2018-11-15 NOTE — Progress Notes (Signed)
Pharmacy Antibiotic Note  [name] is a 48 y.o. female admitted on 11/07/2018 with cellulitis.  Pt clinically worse this morning with hypotension and decreased mental status.  Previously on Ancef for cellulitis.  To broaden coverage to Cefepime for pneumonia.    Plan: Cefepime 1gm IV q8h Follow-up cx data and clinical progress.  Height: 5' 5" (165.1 cm) Weight: 176 lb 5.9 oz (80 kg) IBW/kg (Calculated) : 57  Temp (24hrs), Avg:98.9 F (37.2 C), Min:98.7 F (37.1 C), Max:99 F (37.2 C)  Recent Labs  Lab 11/10/18 0937 11/12/18 0922 11/13/18 0512 11/14/18 0403 11/15/18 0500  WBC 6.9 7.0 14.0* 17.1* 12.7*  CREATININE  --  1.04* 1.03* 0.91 0.99    Estimated Creatinine Clearance: 73.4 mL/min (by C-G formula based on SCr of 0.99 mg/dL).    Allergies  Allergen Reactions  . Cymbalta [Duloxetine Hcl] Anxiety  . Hydromorphone Other (See Comments)    BP drop and heart rate drops  . Buprenorphine Hcl Hives  . Compazine     unknown  . Metoclopramide Other (See Comments)    contractures  . Morphine And Related Hives  . Na Ferric Gluc Cplx In Sucrose Other (See Comments)    unknown  . Ondansetron Rash    Whelps  . Promethazine Hcl Hives  . Tegaderm Ag Mesh [Silver] Rash    Old formulation only, is able to tolerate new formulation    Antimicrobials this admission: Keflex >> Clinda PTA Vanc 2/7 >>2/9 Doxy 2/10 >>2/11 Clinda 2/11 >>2/12 Cefazolin 2/12 >>2/15 Cefepime 2/15 >>  Dose adjustments this admission:   Microbiology results: 2/14 BCx: ngtd 2/15: Resp Panel: pending  Thank you for allowing pharmacy to be a part of this patient's care.  [employer], Pharm.D., BCPS Clinical Pharmacist  **Pharmacist phone directory can now be found on [email or web address] ([email or web address]).  Listed under [place].  11/15/2018 10:07 AM

## 2018-11-16 ENCOUNTER — Encounter (HOSPITAL_COMMUNITY): Payer: Self-pay | Admitting: Pulmonary Disease

## 2018-11-16 ENCOUNTER — Inpatient Hospital Stay (HOSPITAL_COMMUNITY): Payer: BLUE CROSS/BLUE SHIELD

## 2018-11-16 DIAGNOSIS — J181 Lobar pneumonia, unspecified organism: Secondary | ICD-10-CM

## 2018-11-16 LAB — CBC WITH DIFFERENTIAL/PLATELET
Abs Immature Granulocytes: 0.03 10*3/uL (ref 0.00–0.07)
BASOS ABS: 0.1 10*3/uL (ref 0.0–0.1)
Basophils Relative: 1 %
Eosinophils Absolute: 0.2 10*3/uL (ref 0.0–0.5)
Eosinophils Relative: 2 %
HCT: 34.4 % — ABNORMAL LOW (ref 36.0–46.0)
Hemoglobin: 10.3 g/dL — ABNORMAL LOW (ref 12.0–15.0)
Immature Granulocytes: 0 %
Lymphocytes Relative: 16 %
Lymphs Abs: 1.5 10*3/uL (ref 0.7–4.0)
MCH: 28.4 pg (ref 26.0–34.0)
MCHC: 29.9 g/dL — ABNORMAL LOW (ref 30.0–36.0)
MCV: 94.8 fL (ref 80.0–100.0)
Monocytes Absolute: 1.2 10*3/uL — ABNORMAL HIGH (ref 0.1–1.0)
Monocytes Relative: 13 %
NEUTROS ABS: 6.4 10*3/uL (ref 1.7–7.7)
Neutrophils Relative %: 68 %
PLATELETS: 281 10*3/uL (ref 150–400)
RBC: 3.63 MIL/uL — ABNORMAL LOW (ref 3.87–5.11)
RDW: 13.5 % (ref 11.5–15.5)
WBC: 9.5 10*3/uL (ref 4.0–10.5)
nRBC: 0 % (ref 0.0–0.2)

## 2018-11-16 LAB — COMPREHENSIVE METABOLIC PANEL
ALBUMIN: 3 g/dL — AB (ref 3.5–5.0)
ALT: 33 U/L (ref 0–44)
ANION GAP: 10 (ref 5–15)
AST: 31 U/L (ref 15–41)
Alkaline Phosphatase: 59 U/L (ref 38–126)
BUN: 14 mg/dL (ref 6–20)
CO2: 22 mmol/L (ref 22–32)
Calcium: 8.6 mg/dL — ABNORMAL LOW (ref 8.9–10.3)
Chloride: 107 mmol/L (ref 98–111)
Creatinine, Ser: 0.9 mg/dL (ref 0.44–1.00)
GFR calc Af Amer: >60 mL/min (ref >60)
GFR calc non Af Amer: >60 mL/min (ref >60)
Glucose, Bld: 102 mg/dL — ABNORMAL HIGH (ref 70–99)
Potassium: 3.4 mmol/L — ABNORMAL LOW (ref 3.5–5.1)
Sodium: 139 mmol/L (ref 135–145)
Total Bilirubin: 0.4 mg/dL (ref 0.3–1.2)
Total Protein: 7 g/dL (ref 6.5–8.1)

## 2018-11-16 LAB — PHOSPHORUS: PHOSPHORUS: 3.3 mg/dL (ref 2.5–4.6)

## 2018-11-16 LAB — MPO/PR-3 (ANCA) ANTIBODIES
ANCA Proteinase 3: <3.5 U/mL (ref 0.0–3.5)
Myeloperoxidase Abs: <9 U/mL (ref 0.0–9.0)

## 2018-11-16 LAB — MAGNESIUM: Magnesium: 2.2 mg/dL (ref 1.7–2.4)

## 2018-11-16 MED ORDER — HYDROCORTISONE NA SUCCINATE PF 100 MG IJ SOLR
50.0000 mg | Freq: Three times a day (TID) | INTRAMUSCULAR | Status: DC
  Administered 2018-11-16: 50 mg via INTRAVENOUS
  Filled 2018-11-16 (×3): qty 1

## 2018-11-16 MED ORDER — IPRATROPIUM-ALBUTEROL 0.5-2.5 (3) MG/3ML IN SOLN
3.0000 mL | Freq: Three times a day (TID) | RESPIRATORY_TRACT | Status: DC
  Administered 2018-11-16 – 2018-11-17 (×5): 3 mL via RESPIRATORY_TRACT
  Filled 2018-11-16 (×7): qty 3

## 2018-11-16 MED ORDER — HYDROMORPHONE HCL 1 MG/ML IJ SOLN
0.5000 mg | INTRAMUSCULAR | Status: DC | PRN
  Administered 2018-11-16 – 2018-11-17 (×3): 0.5 mg via INTRAVENOUS
  Filled 2018-11-16 (×3): qty 1

## 2018-11-16 MED ORDER — HYDROCORTISONE NA SUCCINATE PF 100 MG IJ SOLR
50.0000 mg | Freq: Three times a day (TID) | INTRAMUSCULAR | Status: DC
  Administered 2018-11-16 – 2018-11-17 (×3): 50 mg via INTRAVENOUS
  Filled 2018-11-16 (×5): qty 1

## 2018-11-16 MED ORDER — ALBUTEROL SULFATE (2.5 MG/3ML) 0.083% IN NEBU: 2.5000 mg | INHALATION_SOLUTION | RESPIRATORY_TRACT | Status: DC | PRN

## 2018-11-16 MED ORDER — TRAMADOL HCL 50 MG PO TABS
100.0000 mg | ORAL_TABLET | Freq: Four times a day (QID) | ORAL | Status: DC | PRN
  Administered 2018-11-16 – 2018-11-17 (×3): 100 mg via ORAL
  Filled 2018-11-16 (×3): qty 2

## 2018-11-16 MED ORDER — POTASSIUM CHLORIDE CRYS ER 20 MEQ PO TBCR
40.0000 meq | EXTENDED_RELEASE_TABLET | Freq: Two times a day (BID) | ORAL | Status: AC
  Administered 2018-11-16: 40 meq via ORAL
  Filled 2018-11-16 (×2): qty 2

## 2018-11-16 NOTE — Progress Notes (Signed)
PROGRESS NOTE    [name]  KZS:[id number] DOB: 1971/05/04 DOA: 11/07/2018 PCP: Su Grand, MD   Brief Narrative:  [name] is a 48 y.o. year old female with medical history significant for Hodgkin's disease s/p mantle radiation, breast ca (path DCIS?), ovarian and cervical cancer (pathology unclear), pre-cancerous thyroid nodules s/p thyroidectomy now surgical hypothyroidism, pituitary adenoma s/p resection with secondary adrenal insufficiency, severe GERD, HTN, HL, anxiety/panic disorder, migraines, and 52-monthhistory of bilateral hand swelling with intermittent ulcerationsrecently treated with Keflex followed by Rocephin by PCP then given 10-day course of clindamycin in ED on 08/2018 who presented on 11/07/2018 with a few weeks of blisters to her right hand that developed into an ulcer with worsening redness, pain and swelling in same pain and was found to have cellulitis of right third finger.   She was first evaluated for blisters which developed into ulcerations of both hands by her PCP 08/2018.  At that time she was treated with one-time dose of Rocephin in the office followed by Keflex.  Due to subjective fevers at that time she was evaluated in the ED and discharged on clindamycin x 11days, left upper extremity ultrasound at that time showed associated superficial occlusive thrombus of left basilic vein with no DVT. ED evaluation at that time also included CTA negative for PE  Since then ambulatory referral to Rheumatology was placed by her primary oncologist. She has had persistent swelling in both hands without any new ulcerations until a few weeks ago with this current presentation.  A week ago noticed right middle finger blisters in middle of finger. It rsolved on its own and she covered with neosporin and dressed for a few days then it turned black with some purulent drainage. Then started noticing redness, numbness x 2 days. Pain extending in to hand during that time as  well.  E visit yesterday---who advised being seen by provider face to face to rule out systemic infection. Denies fevers, chills. Chronic nausea. No vomiting, abd pain, no history of trauma/injury to hand. No other ulcers. No joint pain or aches. No dysuria, no diarrhea/constipation  This a.m. she had 2 episodes of small-volume hemoptysis while coughing.  Reports some increased shortness of breath.  No fevers or chills.  Also shows new small lesions on left wrist and left forearm and right forearm..  **ID, Pulmonary and Hand Surgery were consulted and workup still pending. She was attempted to undergo Bronchoscopy on 2/14 at 2:00 pm but per my Conversation with Dr. ELoanne Drillingwas unable to due to tolerance. Flu PCR is Negative x2 but ID recommending treatment dosing. ID recommended General Surgery to biopsy her skin ulcer on her arm and current feeling is that it is 2/2 to a Vasculitic Process. She spiked a temperature overnight on 2/12-2/12 and so Blood Cx were ordered but never drawn so they were ordered again. Still feels bad and had a higher fever yesterday AM and is coughing and feeling more congested. CXR shows worsened LUL consolidation so Abx were broadened to IV Cefepime and IV Metronidazole. Still not feeling well so will place on Stress Dose Steroids now with IV Solucortef 50 mg q8h.   Assessment & Plan:   Principal Problem:   Cellulitis of right middle finger Active Problems:   Hodgkin lymphoma (HCC)   Breast cancer (HCC)   Thyroid cancer (HCC)   Adrenal insufficiency (HCC)   Hypertension   Hyperlipidemia   Edema   Right heart failure (HCC)   Nephrolithiasis  Oxygen dependent   Migraine   Hemoptysis   SOB (shortness of breath)   FUO (fever of unknown origin)   HCAP (healthcare-associated pneumonia)  Right third finger ulceration, with Cellulitis, improving  -X-ray imaging shows involvement of subcutaneous tissue with no obvious involvement with bone.   -Dr. Amedeo Plenty with surgery  following no signs of abscess/compartment syndrome or lymphangitis.   -Given she had no sepsis physiology and no obvious worsening erythema she was transitioned to doxycycline on 2/10 but now has new lesions on left wrist and forearm and right forearm (slight abrasions with some surrounding erythema).   -Discontinued Doxycycline and started Clindamycin since she has done well with this in the past (prior presentation at [facility] during November 2019)and closely monitor over next 24 hours to ensure no fevers or worsening clinical status but discussed with ID Dr. Tommy Medal who recommended changing to Cefazolin and obtaining an MRI of the Hand -MRI of Right Hand ordered along with Inflammatory Markers and (ESR was 18 and CRP was 1.3) and along with ANCA titers, Rheumatoid Factor; ID added other Autoimmune studies including Anti-phospholidid Ab and SSA/SSB -Also obtaining UDS  -TTE negative for vegetation, no DVT in R arm, no clot in subclavian.  -Blood cultures ordered because she became febrile at 100.8 but patient refused them -IV Abx changed from Cefazolin now to IV Cefepime and Metronidazole  -WBC also elevated to 14,000 and worsened to 17,100 and now is trending back down 9,500  Left Upper Lobe Pneumonia -Blood cultures ordered because she became febrile at 100.8 but patient refused them when she spiked a tempreature but did allow [place] to get a Left Thumb Blood Cx -WBC also elevated to 14,000 and worsened to 17,100 and now is trending back down 9,500 -Further Abx Recommendations per ID; Influenza Negative via PCR but ID feels as if this may be a False positive and ID recommends that if she goes for Bronchoscopy to send a new PCR from BAL for Influenza; Unable to Do Bronchoscopy and repeat Influenza Panel Negative -Respiratory Virus Panel Negative  -She has been started on prophylatic Influenza Dosing due to concern of Nosocomial Spread as her neighbors in the hospital have the Flu but this was changed to  Treatment dosing per ID Recommendations  -Will get Strep A Cx -Continue to Monitor and Further Abx treatment course per ID -CXR this AM showed Stable LEFT UPPER LOBE pneumonia.  No new abnormalities. -Broadened Abx as above; Started Flutter Valve, Incentive Spirometry, Guaifenesin  -C/w Budesonide 0.5 mg Neb BID and Breo-Ellipta -Pulmonary to Re-evaluate   Small volume hemoptysis episodes -Becoming more frequent.  Occurring with cough.   -CT chest negative for any new masses, chronic nodule shown.   -Sending for culture to assess for potential infection given some diminished breath sounds in left bases.  -CXR showed Small airspace opacity on the left that was not seen on chest CT 3 days ago and consistent with an inflammatory process, presumably pneumonia. -Consulted Pulmonary for further evaluation and recommendations and defer to them for further workup -Pulmonary planning on doing a Bronchoscopy and this was attempted today but patient stated she had problem with sedation and usually requires deep sedation with Propofol but it was attempted and then aborted. Dr. Cordelia Pen partner to follow up this weekend and ? If Bronchosopy is going to be rescheduled next week under Anesthesia -Per my conversation with Dr. Hale Bogus she feels as if the patient's Hempotypsis could be 2/2 to Nose bleeding but ? Related to  this LUL Pneumonia -Continue to Monitor and Pulmonary to decide about Bronchoscopy with Anesthesia   Subacute history of blisters developing into ulcers and bilateral hand and arm swelling.   -Ongoing for 3 months.  Is unclear if this is related to some autoimmune process (has history of Raynaud's phenomenon).   -Increased Lasix to 40 mg BID and swelling is improving  -CCP was obtained on 08/2018 at ED vist and was unremarkable.   -HIV and ANA negative here. Will obtain ANCA Titers, Rhuematoid Factor, ESR and CRP and also obtain a UDS; As above -So far Autoimmune Studies have been  negative.  -RA Latex Turbid was 11.1 and repeat was 18.6 -Unclear family history as patient is adopted, does not have any other systemic symptoms (no joint aches, no joint pain,). -CT chest was negative for any new metastatic lesions or any lymphadenopathy  -Dr. Tommy Medal reached out to Dr. Amil Amen of Rheumatology who may offer more insight -D/C'd IV Cefazolin and and escalated Abx to IV Cefepime and Metronidazole  -General Surgery consulted for Biopsy of skin ulceration -Follow up on studies ordered   -Will likely need to discuss with Hematology/Oncology given her previous Hx of Breast Cancer and Hodgkins Lymphoma about the possiblity of Paraneoplastic syndromes  -Has an outpatient Rheumatology referral scheduled in March   Adrenal Insufficiency secondary to pituitary resection for adenoma (2002, status post gamma knife surgery for regrowth of tumor in 2015).   -Had originally Discontinued IV hydrocortisone and transitioned to oral stress dosing 20 mg of hydrocortisone x3 days followed by reinitiation of her home regimen 10 mg in the a.m. 5 mg in p.m. but was not done so her Steroids were  back to 20 mg in AM and 10 mg after Lunch for a short term; Because she is not feeling better and was slightly hypotensive will start back IV Solucortef at 50 mg q8h and then Wean Appropriately   Thrombosis of basilic vein (99/24/2683).   -Status post 14-day treatment Lovenox by oncologist in 08/2018 -Continue to Monitor   Chronic hypoxic respiratory failure, stable on home 3 L nasal cannula.  ? 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   -Followed by [facility] pulmonology.  Continue home Breo Pulmicort BIDadded here.  Albuterol PRN and scheduled DuoNeb -Pulmonary Folllowing and planning on  doing Bronchoscopy  -Per [facility] Notes Dr. Geraldo Docker:  "She has already had much of the work-up for this (CT chest, PFTs, TTE, RHC--note she does not have PAH based on her RHC with wedge 16 and PA mean 20.) though we have not objectively confirmed it in our own clinic. A low sat was documented in the cancer center once early on (at rest) but she has not made it to a 6MWT and her CPET in 2017 was very normal in terms of oxygenation. She cannot walk aggressively enough for a 6MWT now given her leg weakness. She thinks she can exercise vigorously on a bike and wants to repeat a CPET. This will also provide info about [place] cardiac [place] - CPET on room air" -Patient never presented for CPET -Yesterday AM she was a little Hypoxic   History of Aortic Stenosis.  -Documented in [facility] Chart. Systolic murmur heard on exam. Last [facility]TE at [facility] on 08/2018 showed no stenosis.  -No vegetation on TTE  here, does have tricupsid valve structure/calcification moderate.   Chronic Nausea.   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   -Followed by Dr. Wendee Beavers in [place]  Stage IV Hodgkin's disease -Diagnosed at age 31.  Status post chemotherapy and mantle field radiotherapy  GERD -C/w Dexilant substitution   HLD,stable -C/w Home Rosuvastatin 10 mg po daily   Post-Surgical Hypothyroidism.  -TSH suppressed but not accurate given prior pituitary resection.  -Follow free T4.  -Continue home synthroid at current dose of 112 mcg   History of CHF with reduced EF. -Last TTE 08/2018 showed normal systolic and diastolic function. BNP here unremarkable.  -Continued home lasix of 20 mg po BID but now increased to 40 mg BID  -Strict I's/O's and Daily Weights; She is  +5.701 Liters since admission; Weight's Not done  Normocytic Anemia  -Patient's Hb/Hct went from 12.1/39.1 -> 10.3/34.4 -Check Anemia Panel in the AM  -Continue to Monitor for S/Sx for Bleeding -Repeat CBC in AM   Hypokalemia -Patient's potassium this morning was 3.4 -Replete with p.o. KCl 40 mEq BID x2 -Continue to monitor and replete as necessary -Repeat CMP in a.m.  Hypophosphatemia -Patient's phosphorus level is currently 3.3 -Continue to monitor and replete as necessary -Repeat phosphorus level in a.m.  DVT prophylaxis: Lovenox Held due to hemoptysis  Code Status: FULL CODE Family Communication: No family present at bedside  Disposition Plan: Remain Inpatient for further workup and evaluation   Consultants:   Pulmonary  Infectious Diseases  Hand Surgery   General Surgery   Procedures:   None  Antimicrobials:  Anti-infectives (From admission, onward)   Start     Dose/Rate Route Frequency Ordered Stop   11/15/18 1030  ceFEPIme (MAXIPIME) 1 g in sodium chloride 0.9 % 100 mL IVPB     1 g 200 mL/hr over 30 Minutes Intravenous Every 8 hours 11/15/18 1008     11/15/18 1000  oseltamivir (TAMIFLU) capsule 75 mg     75 mg Oral 2 times daily 11/15/18 0849     11/15/18 0900  metroNIDAZOLE (FLAGYL) IVPB 500 mg     500 mg 100 mL/hr over 60 Minutes Intravenous Every 8 hours 11/15/18 0849     11/14/18 1000  oseltamivir (TAMIFLU) capsule 75 mg  Status:  Discontinued     75 mg Oral Daily 11/14/18 0833 11/15/18 0849   11/12/18 2200  ceFAZolin (ANCEF) IVPB 1 g/50 mL premix  Status:  Discontinued     1 g 100 mL/hr over 30 Minutes Intravenous Every 8 hours 11/12/18 1841 11/15/18 0849   11/12/18 1930  ceFAZolin (ANCEF) IVPB 1 g/50 mL premix  Status:  Discontinued     1 g 100 mL/hr over 30 Minutes Intravenous Every 8 hours 11/12/18 1838 11/12/18 1841   11/11/18 1400  clindamycin (CLEOCIN) IVPB 600 mg  Status:  Discontinued     600 mg 100 mL/hr over 30 Minutes Intravenous Every  8 hours 11/11/18 1014 11/12/18 1814   11/10/18 1645  doxycycline (VIBRA-TABS) tablet 100 mg  Status:  Discontinued     100 mg Oral Every 12 hours 11/10/18 1641 11/11/18 1014   11/08/18 1000  vancomycin (VANCOCIN) IVPB 1000 mg/200 mL premix  Status:  Discontinued     1,000 mg 200 mL/hr over 60 Minutes Intravenous Every 12 hours 11/07/18 2115 11/09/18 1545   11/07/18 2130  vancomycin (VANCOCIN) IVPB 750 mg/150 ml premix     750 mg 150 mL/hr over 60 Minutes Intravenous  Once 11/07/18 2110 11/07/18 2310   11/07/18 2000  vancomycin (VANCOCIN) IVPB 1000 mg/200 mL premix     1,000 mg 200 mL/hr over 60 Minutes Intravenous  Once 11/07/18 1955 11/07/18 2117     Subjective: Seen and examined at bedside and states she still feels worse and congested significantly and complains of more chest tightness today. Had lower BP's yesterday but are reasonably better today. No lightheadedness or dizziness but complaining of significant cough. Had some mild rhonchi and wheezing. No other concerns or complaints at this time but feels "awful."   Objective: Vitals:   11/15/18 2036 11/16/18 0441 11/16/18 0917 11/16/18 1316  BP:  139/85    Pulse:  92    Resp:  16    Temp:  98.6 F (37 C)    TempSrc:  Oral    SpO2: 97% 100% 98% 99%  Weight:      Height:        Intake/Output Summary (Last 24 hours) at 11/16/2018 1358 Last data filed at 11/16/2018 1300 Gross per 24 hour  Intake 360 ml  Output -  Net 360 ml   Filed Weights   11/07/18 1635  Weight: 80 kg   Examination: Physical Exam:  Constitutional: Well-nourished, well-developed overweight Caucasian female who is currently feeling well and appears little uncomfortable Eyes: Conjunctive are normal.  Sclera anicteric ENMT: External ears and nose appear normal.  Grossly normal hearing Neck: Appears supple with no JVD Respiratory: Diminished auscultation bilaterally with coarse breath sounds and some upper lobe rhonchi on the left worse than the right  with some mild wheezing. Cardiovascular: Regular rate and rhythm.  Slightly on the faster side.  Upper extremity edema is much improved than several days ago Abdomen: Soft, nontender, distended secondary body habitus.  Bowel sounds present GU: Deferred Musculoskeletal: No no contractures or cyanosis.  No joint deformities Skin: No appreciable rashes but does have some extremity edema which is improved and some blistering with a swollen middle finger which is also improving.  No significant erythema noted Neurologic: Cranial nerves II through XII grossly intact no appreciable focal deficits Psychiatric: Depressed mood and flat affect.  Intact judgment and insight  Data Reviewed: I have personally reviewed following labs and imaging studies  CBC: Recent Labs  Lab 11/12/18 0922 11/13/18 0512 11/14/18 0403 11/15/18 0500 11/16/18 1029  WBC 7.0 14.0* 17.1* 12.7* 9.5  NEUTROABS 4.3 10.8* 12.3* 9.4* 6.4  HGB 11.1* 11.2* 11.1* 10.2* 10.3*  HCT 36.3 36.5 34.9* 33.4* 34.4*  MCV 92.6 91.7 92.3 94.1 94.8  PLT 258 268 250 246 124   Basic Metabolic Panel: Recent Labs  Lab 11/12/18 0922 11/13/18 0512 11/14/18 0403 11/15/18 0500 11/16/18 1029  NA 140 135 136 136 139  K 3.5 3.6 3.1* 3.5 3.4*  CL 104 101 103 103 107  CO2 _0 GLUCOSE 123* 130* 121* 115* 102*  BUN _1 CREATININE 1.04* 1.03* 0.91 0.99 0.90  CALCIUM 8.6* 8.9 8.5* 8.2* 8.6*  MG 2.0 1.8 2.0 2.3 2.2  PHOS 4.3 3.0 2.1* 2.4* 3.3   GFR: Estimated Creatinine Clearance: 80.8 mL/min (by C-G formula based on SCr of 0.9 mg/dL). Liver Function Tests: Recent Labs  Lab 11/12/18 0922 11/13/18 0512 11/14/18 0403 11/15/18 0500 11/16/18 1029  AST _2 ALT 32 _3 33  ALKPHOS 39 44 50 52 59  BILITOT 0.3 0.6 0.8 0.4 0.4  PROT 6.0* 6.5 6.7 6.6 7.0  ALBUMIN 3.2* 3.3* 3.2* 2.9* 3.0*   No results for input(s): LIPASE, AMYLASE in the last 168 hours. No results for input(s): AMMONIA in the last  168 hours. Coagulation Profile: No results for input(s): INR, PROTIME in the last 168 hours. Cardiac Enzymes: No results for input(s): CKTOTAL, CKMB, CKMBINDEX, TROPONINI in the last 168 hours. BNP (last 3 results) No results for input(s): PROBNP in the last 8760 hours. HbA1C: No results for input(s): HGBA1C in the last 72 hours. CBG: Recent Labs  Lab 11/12/18 1216 11/13/18 1116  GLUCAP 94 112*   Lipid Profile: No results for input(s): CHOL, HDL, LDLCALC, TRIG, CHOLHDL, LDLDIRECT in the last 72 hours. Thyroid Function Tests: No results for input(s): TSH, T4TOTAL, FREET4, T3FREE, THYROIDAB in the last 72 hours. Anemia Panel: No results for input(s): VITAMINB12, FOLATE, FERRITIN, TIBC, IRON, RETICCTPCT in the last 72 hours. Sepsis Labs: No results for input(s): PROCALCITON, LATICACIDVEN in the last 168 hours.  Recent Results (from the past 240 hour(s))  Expectorated sputum assessment w rflx to resp cult     Status: None   Collection Time: 11/11/18  3:21 PM  Result Value Ref Range Status   Specimen Description EXPECTORATED SPUTUM  Final   Special Requests NONE  Final   Sputum evaluation   Final    Sputum specimen not acceptable for testing.  Please recollect.   RESULT CALLED TO, READ BACK BY AND VERIFIED WITH: R RAKHIMOVA RN 2255 11/11/18 A BROWNING Performed at [facility], [place] [address] [place], [place] [address]    Report Status 11/11/2018 FINAL  Final  Expectorated sputum assessment w rflx to resp cult     Status: None   Collection Time: 11/12/18  3:31 AM  Result Value Ref Range Status   Specimen Description EXPECTORATED SPUTUM  Final   Special Requests NONE  Final   Sputum evaluation   Final    Sputum specimen not acceptable for testing.  Please recollect.   RESULT CALLED TO, READ BACK BY AND VERIFIED WITH: RALCHIMOVA RN 11/12/18 0605 JDW Performed at [place] [address], [place], [place] [address]    Report Status 11/12/2018 FINAL  Final    Culture, blood (routine x 2)     Status: None (Preliminary result)   Collection Time: 11/14/18  9:03 AM  Result Value Ref Range Status   Specimen Description BLOOD LEFT THUMB  Final   Special Requests   Final    BOTTLES DRAWN AEROBIC ONLY Blood Culture adequate volume   Culture   Final    NO GROWTH 2 DAYS Performed at [facility], [place] [address] [place], [place] [address]    Report Status PENDING  Incomplete  Respiratory Panel by PCR     Status: None   Collection Time: 11/15/18  8:20 AM  Result Value Ref Range Status   Adenovirus NOT DETECTED NOT DETECTED Final   Coronavirus 229E NOT DETECTED NOT DETECTED Final    Comment: (NOTE) The Coronavirus on the Respiratory Panel, DOES NOT test for the novel  Coronavirus (2019 nCoV)    Coronavirus HKU1 NOT DETECTED NOT DETECTED Final   Coronavirus NL63 NOT DETECTED NOT DETECTED Final   Coronavirus OC43 NOT DETECTED NOT DETECTED Final   Metapneumovirus NOT DETECTED NOT DETECTED Final   Rhinovirus / Enterovirus NOT DETECTED NOT DETECTED Final   Influenza A NOT  DETECTED NOT DETECTED Final   Influenza B NOT DETECTED NOT DETECTED Final   Parainfluenza Virus 1 NOT DETECTED NOT DETECTED Final   Parainfluenza Virus 2 NOT DETECTED NOT DETECTED Final   Parainfluenza Virus 3 NOT DETECTED NOT DETECTED Final   Parainfluenza Virus 4 NOT DETECTED NOT DETECTED Final   Respiratory Syncytial Virus NOT DETECTED NOT DETECTED Final   Bordetella pertussis NOT DETECTED NOT DETECTED Final   Chlamydophila pneumoniae NOT DETECTED NOT DETECTED Final   Mycoplasma pneumoniae NOT DETECTED NOT DETECTED Final    Comment: Performed at [facility], [place] [address], [place], [place] [address]    Radiology Studies: Dg Chest Port 1 View  Result Date: 11/16/2018 CLINICAL DATA:  Shortness of breath. Follow-up LEFT UPPER LOBE pneumonia. EXAM: PORTABLE CHEST 1 VIEW COMPARISON:  11/15/2018 and earlier. FINDINGS: Heart size upper normal, unchanged. Airspace  consolidation in the LEFT UPPER LOBE, unchanged since yesterday. No new pulmonary parenchymal abnormalities. No pleural effusions. RIGHT jugular Port-A-Cath tip projects at or near the cavoatrial junction, unchanged. Surgical clips in both axilla from prior node dissection. IMPRESSION: Stable LEFT UPPER LOBE pneumonia.  No new abnormalities. Electronically Signed   By: Evangeline Dakin M.D.   On: 11/16/2018 09:25   Dg Chest Port 1 View  Result Date: 11/15/2018 CLINICAL DATA:  Shortness of breath today. EXAM: PORTABLE CHEST 1 VIEW COMPARISON:  11/14/2018 and older exams. FINDINGS: Left upper lobe consolidation is stable from the previous day's exam. No new lung abnormalities. No pneumothorax or pleural effusion. Stable right-sided Port-A-Cath. IMPRESSION: 1. No change from the previous day's study. 2. Left upper lobe consolidation which has worsened when compared to a chest radiograph dated 11/13/2018. Electronically Signed   By: Lajean Manes M.D.   On: 11/15/2018 13:48    Scheduled Meds: . budesonide  0.5 mg Inhalation BID  . clonazePAM  1 mg Oral TID  . escitalopram  20 mg Oral Daily  . estradiol  2 mg Oral Daily  . fluticasone furoate-vilanterol  1 puff Inhalation Daily  . furosemide  40 mg Oral BID  . guaiFENesin  1,200 mg Oral BID  . hydrocortisone sod succinate (SOLU-CORTEF) inj  50 mg Intravenous Q8H  . ipratropium-albuterol  3 mL Nebulization TID  . levothyroxine  112 mcg Oral QAC breakfast  . metoprolol succinate  37.5 mg Oral Daily  . oseltamivir  75 mg Oral BID  . potassium citrate  10 mEq Oral Daily  . rosuvastatin  10 mg Oral Daily  . sodium chloride flush  10-40 mL Intracatheter Q12H  . topiramate  150 mg Oral QHS   Continuous Infusions: . sodium chloride 10 mL/hr at 11/15/18 0553  . ceFEPime (MAXIPIME) IV 1 g (11/16/18 1100)  . metronidazole 500 mg (11/16/18 0940)    LOS: 9 days   Kerney Elbe, DO [facility] PAGER is on [place]  If 7PM-7AM, please  contact night-coverage [email or web address] Password [email or web address] 11/16/2018, 1:58 PM

## 2018-11-16 NOTE — Progress Notes (Signed)
Subjective:  She is not feeling much better feels her tight chest is more tight now.  Antibiotics:  Anti-infectives (From admission, onward)   Start     Dose/Rate Route Frequency Ordered Stop   11/15/18 1030  ceFEPIme (MAXIPIME) 1 g in sodium chloride 0.9 % 100 mL IVPB     1 g 200 mL/hr over 30 Minutes Intravenous Every 8 hours 11/15/18 1008     11/15/18 1000  oseltamivir (TAMIFLU) capsule 75 mg     75 mg Oral 2 times daily 11/15/18 0849     11/15/18 0900  metroNIDAZOLE (FLAGYL) IVPB 500 mg     500 mg 100 mL/hr over 60 Minutes Intravenous Every 8 hours 11/15/18 0849     11/14/18 1000  oseltamivir (TAMIFLU) capsule 75 mg  Status:  Discontinued     75 mg Oral Daily 11/14/18 0833 11/15/18 0849   11/12/18 2200  ceFAZolin (ANCEF) IVPB 1 g/50 mL premix  Status:  Discontinued     1 g 100 mL/hr over 30 Minutes Intravenous Every 8 hours 11/12/18 1841 11/15/18 0849   11/12/18 1930  ceFAZolin (ANCEF) IVPB 1 g/50 mL premix  Status:  Discontinued     1 g 100 mL/hr over 30 Minutes Intravenous Every 8 hours 11/12/18 1838 11/12/18 1841   11/11/18 1400  clindamycin (CLEOCIN) IVPB 600 mg  Status:  Discontinued     600 mg 100 mL/hr over 30 Minutes Intravenous Every 8 hours 11/11/18 1014 11/12/18 1814   11/10/18 1645  doxycycline (VIBRA-TABS) tablet 100 mg  Status:  Discontinued     100 mg Oral Every 12 hours 11/10/18 1641 11/11/18 1014   11/08/18 1000  vancomycin (VANCOCIN) IVPB 1000 mg/200 mL premix  Status:  Discontinued     1,000 mg 200 mL/hr over 60 Minutes Intravenous Every 12 hours 11/07/18 2115 11/09/18 1545   11/07/18 2130  vancomycin (VANCOCIN) IVPB 750 mg/150 ml premix     750 mg 150 mL/hr over 60 Minutes Intravenous  Once 11/07/18 2110 11/07/18 2310   11/07/18 2000  vancomycin (VANCOCIN) IVPB 1000 mg/200 mL premix     1,000 mg 200 mL/hr over 60 Minutes Intravenous  Once 11/07/18 1955 11/07/18 2117      Medications: Scheduled Meds: . budesonide  0.5 mg Inhalation BID    . clonazePAM  1 mg Oral TID  . escitalopram  20 mg Oral Daily  . estradiol  2 mg Oral Daily  . fluticasone furoate-vilanterol  1 puff Inhalation Daily  . furosemide  40 mg Oral BID  . guaiFENesin  1,200 mg Oral BID  . hydrocortisone sod succinate (SOLU-CORTEF) inj  50 mg Intravenous Q8H  . ipratropium-albuterol  3 mL Nebulization TID  . levothyroxine  112 mcg Oral QAC breakfast  . metoprolol succinate  37.5 mg Oral Daily  . oseltamivir  75 mg Oral BID  . potassium citrate  10 mEq Oral Daily  . rosuvastatin  10 mg Oral Daily  . sodium chloride flush  10-40 mL Intracatheter Q12H  . topiramate  150 mg Oral QHS   Continuous Infusions: . sodium chloride 10 mL/hr at 11/15/18 0553  . ceFEPime (MAXIPIME) IV 1 g (11/16/18 0241)  . metronidazole 500 mg (11/16/18 0940)   PRN Meds:.acetaminophen, albuterol, cyclobenzaprine, diphenhydrAMINE, fentaNYL (SUBLIMAZE) injection, HYDROmorphone (DILAUDID) injection, midazolam, phenol, sodium chloride flush, sodium chloride flush, sucralfate, traMADol, zolpidem    Objective: Weight change:   Intake/Output Summary (Last 24 hours) at 11/16/2018 1104 Last data filed  at 11/16/2018 0900 Gross per 24 hour  Intake 300 ml  Output -  Net 300 ml   Blood pressure 139/85, pulse 92, temperature 98.6 F (37 C), temperature source Oral, resp. rate 16, height _0  (1.651 m), weight 80 kg, SpO2 98 %. Temp:  [98.5 F (36.9 C)-98.6 F (37 C)] 98.6 F (37 C) (02/16 0441) Pulse Rate:  [92-102] 92 (02/16 0441) Resp:  [16] 16 (02/16 0441) BP: (109-139)/(77-85) 139/85 (02/16 0441) SpO2:  [95 %-100 %] 98 % (02/16 0917)  Physical Exam: General: Alert and awake, oriented x3, not in any acute distress. HEENT: anicteric sclera, EOMI CVS regular rate, normal  Chest: Rhonchorous airway sounds throughout her lungs  Abdomen: soft non-distended,  Extremities: no edema or deformity noted bilaterally  Neuro: nonfocal  Skin exam 11/14/2018:    Biopsy site was  clean   CBC:    BMET Recent Labs    11/14/18 0403 11/15/18 0500  NA 136 136  K 3.1* 3.5  CL 103 103  CO2 23 23  GLUCOSE 121* 115*  BUN 11 13  CREATININE 0.91 0.99  CALCIUM 8.5* 8.2*     Liver Panel  Recent Labs    11/14/18 0403 11/15/18 0500  PROT 6.7 6.6  ALBUMIN 3.2* 2.9*  AST 28 26  ALT 30 25  ALKPHOS 50 52  BILITOT 0.8 0.4       Sedimentation Rate No results for input(s): ESRSEDRATE in the last 72 hours. C-Reactive Protein No results for input(s): CRP in the last 72 hours.  Micro Results: Recent Results (from the past 720 hour(s))  Expectorated sputum assessment w rflx to resp cult     Status: None   Collection Time: 11/11/18  3:21 PM  Result Value Ref Range Status   Specimen Description EXPECTORATED SPUTUM  Final   Special Requests NONE  Final   Sputum evaluation   Final    Sputum specimen not acceptable for testing.  Please recollect.   RESULT CALLED TO, READ BACK BY AND VERIFIED WITH: R RAKHIMOVA RN 2255 11/11/18 A BROWNING Performed at [facility], [place] [address] [place], [place] [address]    Report Status 11/11/2018 FINAL  Final  Expectorated sputum assessment w rflx to resp cult     Status: None   Collection Time: 11/12/18  3:31 AM  Result Value Ref Range Status   Specimen Description EXPECTORATED SPUTUM  Final   Special Requests NONE  Final   Sputum evaluation   Final    Sputum specimen not acceptable for testing.  Please recollect.   RESULT CALLED TO, READ BACK BY AND VERIFIED WITH: RALCHIMOVA RN 11/12/18 0605 JDW Performed at [facility] [address], [place], [place] [address]    Report Status 11/12/2018 FINAL  Final  Culture, blood (routine x 2)     Status: None (Preliminary result)   Collection Time: 11/14/18  9:03 AM  Result Value Ref Range Status   Specimen Description BLOOD LEFT THUMB  Final   Special Requests   Final    BOTTLES DRAWN AEROBIC ONLY Blood Culture adequate volume   Culture   Final    NO GROWTH  2 DAYS Performed at [facility], [place] [address], [place], [place] [address]    Report Status PENDING  Incomplete  Respiratory Panel by PCR     Status: None   Collection Time: 11/15/18  8:20 AM  Result Value Ref Range Status   Adenovirus NOT DETECTED NOT DETECTED  Final   Coronavirus 229E NOT DETECTED NOT DETECTED Final    Comment: (NOTE) The Coronavirus on the Respiratory Panel, DOES NOT test for the novel  Coronavirus (2019 nCoV)    Coronavirus HKU1 NOT DETECTED NOT DETECTED Final   Coronavirus NL63 NOT DETECTED NOT DETECTED Final   Coronavirus OC43 NOT DETECTED NOT DETECTED Final   Metapneumovirus NOT DETECTED NOT DETECTED Final   Rhinovirus / Enterovirus NOT DETECTED NOT DETECTED Final   Influenza A NOT DETECTED NOT DETECTED Final   Influenza B NOT DETECTED NOT DETECTED Final   Parainfluenza Virus 1 NOT DETECTED NOT DETECTED Final   Parainfluenza Virus 2 NOT DETECTED NOT DETECTED Final   Parainfluenza Virus 3 NOT DETECTED NOT DETECTED Final   Parainfluenza Virus 4 NOT DETECTED NOT DETECTED Final   Respiratory Syncytial Virus NOT DETECTED NOT DETECTED Final   Bordetella pertussis NOT DETECTED NOT DETECTED Final   Chlamydophila pneumoniae NOT DETECTED NOT DETECTED Final   Mycoplasma pneumoniae NOT DETECTED NOT DETECTED Final    Comment: Performed at [facility], [place] [address], [place], [place] [address]    Studies/Results: Dg Chest Port 1 View  Result Date: 11/16/2018 CLINICAL DATA:  Shortness of breath. Follow-up LEFT UPPER LOBE pneumonia. EXAM: PORTABLE CHEST 1 VIEW COMPARISON:  11/15/2018 and earlier. FINDINGS: Heart size upper normal, unchanged. Airspace consolidation in the LEFT UPPER LOBE, unchanged since yesterday. No new pulmonary parenchymal abnormalities. No pleural effusions. RIGHT jugular Port-A-Cath tip projects at or near the cavoatrial junction, unchanged. Surgical clips in both axilla from prior node dissection. IMPRESSION: Stable LEFT UPPER LOBE  pneumonia.  No new abnormalities. Electronically Signed   By: Evangeline Dakin M.D.   On: 11/16/2018 09:25   Dg Chest Port 1 View  Result Date: 11/15/2018 CLINICAL DATA:  Shortness of breath today. EXAM: PORTABLE CHEST 1 VIEW COMPARISON:  11/14/2018 and older exams. FINDINGS: Left upper lobe consolidation is stable from the previous day's exam. No new lung abnormalities. No pneumothorax or pleural effusion. Stable right-sided Port-A-Cath. IMPRESSION: 1. No change from the previous day's study. 2. Left upper lobe consolidation which has worsened when compared to a chest radiograph dated 11/13/2018. Electronically Signed   By: Lajean Manes M.D.   On: 11/15/2018 13:48      Assessment/Plan:  INTERVAL HISTORY: Last x-ray this morning shows no change in left upper lobe infiltrate  Principal Problem:   Cellulitis of right middle finger Active Problems:   Hodgkin lymphoma (HCC)   Breast cancer (HCC)   Thyroid cancer (HCC)   Adrenal insufficiency (HCC)   Hypertension   Hyperlipidemia   Edema   Right heart failure (HCC)   Nephrolithiasis   Oxygen dependent   Migraine   Hemoptysis   SOB (shortness of breath)   FUO (fever of unknown origin)   HCAP (healthcare-associated pneumonia)    JANNETTE COTHAM is a 48 y.o. 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  He has been found to have a left upper lobe pneumonia   #1  Left upper lobe pneumonia  Continue to treat with cefepime and metronidazole as well as Tamiflu for influenza and (presumptive diagnosis  #2 hemoptysis: Question whether this is all related to a nosocomial pneumonia or whether it has some thing  informative to  give [place] about her chronic pulmonary problems and her almost with lymphedema and skin ulcers over the last several months.  Bronchoscopy may happen on Monday in the operating room  Autoimmune work-up remains unremarkable   #2 Skin ulcers of unknown cause: Punch biopsy suggest that these could be due to excoriation.  So far laboratory testing in the blood is been unrevealing.      LOS: 9 days   Alcide Evener 11/16/2018, 11:04 AM

## 2018-11-16 NOTE — Progress Notes (Signed)
NAME:  [name], MRN:  [id number], DOB:  05-26-1971, LOS: 32 ADMISSION DATE:  11/07/2018, CONSULTATION DATE:  11/12/18 REFERRING MD:  [name] - [facility], CHIEF COMPLAINT:  Hemoptysis  Brief History   47 yo F  With PMH HTN, R sided heart failure, ?chronic hypoxic respiratory failure on home 3-4LNC, osteoporosis and extensive oncologic PMH including Hodgkin's lymphona in remission, Breast cancer with multiple recurrences s/p bilatearal mastectomy now in remission, pituitary adenoma s/p resection, thyroid cancer s/p thyroidectomy with resultant hypothyroidism, ?cervical cancer s/p LEEP who presented 2/7 for progressive BUE swelling. Swelling began multiple months before presentation and intermittently had associated spontaneously occurring and resolving blisters. Several weeks ago, the patient was started on abx (Keflex to [place]) for apparent cellulitis of an open blister. 1 week prior to presentation the patient reports the onset of multiple small blisters on right 3rd finger, which developed surrounding erythema and dark ulceration at time of presentation to ED.  The patient denied recent illness, fever, chills, but endorsed recent abx treatment as above. The patient reported decreased sensation in affected finger with associated decreased ROM.  In the ED the patient was started on vancomycin, hand surgery was consulted.   Over course of hospitalization, patient has been followed by ortho and IM. Ortho does not feel patient is a surgical candidate. Hospital course has been complicated by small volume hemoptysis x3. CTA chest negative for PE, but small lung nodule noted. PCCM consulted for evaluation.   Patient was recently hospitalized at [facility], and has several OP follow ups scheduled including anOP pulmonary appointment with Dr. Nancie White ([facility] [place]) in March, 2020.    Past Medical History  Addison's disease Aortic stenosis Appendicitis Breast cancer, s/p bilateral mastectomy CHF, right heart  failure ?Chronic hypoxic respiratory failure GERD Hodgkin lymphoma s/p mantle radiation HTN Migraine Osteoporosis  Raynauds Thyroid cancer s/p thyroidectomy  Hypothyroidism  Cervical cancer s/p LEEP  Lung nodules    Significant Hospital Events   2/7> admitted 2/10> CTA negative for PE. Stable pulmonary nodules compared to prior outside hospital CTs 2/13- Elevated temperature overnight TMax 100.4 Patient states she "feels like she got hit by a bus" and additionally states she "would feel much better with a bronch."  Consults:  Ortho PCCM  ID  Procedures:   Significant Diagnostic Tests:  XR R Hand 2/7> soft tissue swelling with edema of dorsal metacarpal. No subcutaneous gas. No acute osseous abnormality  CT with contrast 2/10> negative for PE MRI hand> suggestive of soft tissue cellulitis second middle fourth finger. No oseto.  CXR 2/13> new small L opacity   Micro Data:  Results for Susan, White (MRN [id number]) as of 11/16/2018 18:10  Ref. Range 11/08/2018 15:35 11/13/2018 00:05 11/14/2018 04:03  Anti Nuclear Antibody(ANA) Latest Ref Range: Negative  Negative    RA Latex Turbid. Latest Ref Range: 0.0 - 13.9 IU/mL  11.1 18.6 (H)  Cytoplasmic (C-ANCA) Latest Ref Range: Neg:<1:20 titer  <1:20   P-ANCA Latest Ref Range: Neg:<1:20 titer  <1:20   Atypical P-ANCA titer Latest Ref Range: Neg:<1:20 titer  <1:20   ENA SM Ab Ser-aCnc Latest Ref Range: 0.0 - 0.9 AI   <0.2     2/12 sputum cx>>  FluA/B> negative 2/13 BCx >>  ............ 2/16 - PCT 2/16 Urine strep 2/16 urine leg ............ 2/16 - additional Autoimmune - MPO, PR3, GBM, scl-70, ssa/ssb   Antimicrobials:  Clindamycin 2/11 Ancef 2/12>>   Interim history/subjective:    2/16  -  Bronch aborted 11/14/2018  due to sendation of lidocaine spray on . Recalled by [facility] due to worsening CXR with LUL infiltrate.ID Rx with tamiflu on empiric basis. Had T max to 103F on 11/13/2018 with high wbc and then settled.  Reporting significant cough since admit . Intermittent hemoptysis still ongoing. Says she needs diprivan based sedation to get procedures done. Has had 1gm% hgb drop daily  She reports being on o2 since 2016 -> but on RA right now x 20 minuets x 99%   Results for Susan, White (MRN [id number]) as of 11/16/2018 18:50  Ref. Range 11/07/2018 18:06 11/09/2018 14:47 11/10/2018 09:37 11/12/2018 09:22 11/13/2018 00:05 11/13/2018 05:12 11/14/2018 04:03 11/15/2018 05:00 11/16/2018 10:29  Hemoglobin Latest Ref Range: 12.0 - 15.0 g/dL 12.1 11.5 (L) 10.8 (L) 11.1 (L)  11.2 (L) 11.1 (L) 10.2 (L) 10.3 (L)    Objective   Blood pressure (!) 86/50, pulse 89, temperature 98.4 F (36.9 C), temperature source Oral, resp. rate 16, height _0  (1.651 m), weight 80 kg, SpO2 99 %.        Intake/Output Summary (Last 24 hours) at 11/16/2018 1816 Last data filed at 11/16/2018 1300 Gross per 24 hour  Intake 240 ml  Output -  Net 240 ml   Filed Weights   11/07/18 1635  Weight: 80 kg     General Appearance:  No distress. Laryngeal cough + Head:  Normocephalic, without obvious abnormality, atraumatic Eyes:  PERRL -yes, conjunctiva/corneas - clear     Ears:  Normal external ear canals, both ears Nose:  G tube - no Throat:  ETT TUBE - no , OG tube - no Neck:  Supple,  No enlargement/tenderness/nodules Lungs: Clear to auscultation bilaterally  But coarse with cough Heart:  S1 and S2 normal, no murmur, CVP - no.  Pressors - n Abdomen:  Soft, no masses, no organomegaly Genitalia / Rectal:  Not done Extremities:  Extremities- intact but has pitting lesions that are gangrenous in right middle finger and other foreamr Skin:  ntact in exposed areas .  Neurologic:  Sedation - none -> RASS - +1 . Moves all 4s - yes. CAM-ICU - neg . Orientation - x3+     LABS    PULMONARY No results for input(s): PHART, PCO2ART, PO2ART, HCO3, TCO2, O2SAT in the last 168 hours.  Invalid input(s): PCO2, PO2  CBC Recent Labs  Lab  11/14/18 0403 11/15/18 0500 11/16/18 1029  HGB 11.1* 10.2* 10.3*  HCT 34.9* 33.4* 34.4*  WBC 17.1* 12.7* 9.5  PLT 250 246 281    COAGULATION No results for input(s): INR in the last 168 hours.  CARDIAC  No results for input(s): TROPONINI in the last 168 hours. No results for input(s): PROBNP in the last 168 hours.   CHEMISTRY Recent Labs  Lab 11/12/18 0922 11/13/18 0512 11/14/18 0403 11/15/18 0500 11/16/18 1029  NA 140 135 136 136 139  K 3.5 3.6 3.1* 3.5 3.4*  CL 104 101 103 103 107  CO2 _1 GLUCOSE 123* 130* 121* 115* 102*  BUN _2 CREATININE 1.04* 1.03* 0.91 0.99 0.90  CALCIUM 8.6* 8.9 8.5* 8.2* 8.6*  MG 2.0 1.8 2.0 2.3 2.2  PHOS 4.3 3.0 2.1* 2.4* 3.3   Estimated Creatinine Clearance: 80.8 mL/min (by C-G formula based on SCr of 0.9 mg/dL).   LIVER Recent Labs  Lab 11/12/18 0922 11/13/18 0512 11/14/18 0403 11/15/18 0500 11/16/18 1029  AST _3 ALT 32  _0 33  ALKPHOS 39 44 50 52 59  BILITOT 0.3 0.6 0.8 0.4 0.4  PROT 6.0* 6.5 6.7 6.6 7.0  ALBUMIN 3.2* 3.3* 3.2* 2.9* 3.0*     INFECTIOUS No results for input(s): LATICACIDVEN, PROCALCITON in the last 168 hours.   ENDOCRINE CBG (last 3)  No results for input(s): GLUCAP in the last 72 hours.       IMAGING x48h  - image(s) personally visualized  -   highlighted in bold Dg Chest Port 1 View  Result Date: 11/16/2018 CLINICAL DATA:  Shortness of breath. Follow-up LEFT UPPER LOBE pneumonia. EXAM: PORTABLE CHEST 1 VIEW COMPARISON:  11/15/2018 and earlier. FINDINGS: Heart size upper normal, unchanged. Airspace consolidation in the LEFT UPPER LOBE, unchanged since yesterday. No new pulmonary parenchymal abnormalities. No pleural effusions. RIGHT jugular Port-A-Cath tip projects at or near the cavoatrial junction, unchanged. Surgical clips in both axilla from prior node dissection. IMPRESSION: Stable LEFT UPPER LOBE pneumonia.  No new abnormalities. Electronically  Signed   By: Evangeline Dakin M.D.   On: 11/16/2018 09:25   Dg Chest Port 1 View  Result Date: 11/15/2018 CLINICAL DATA:  Shortness of breath today. EXAM: PORTABLE CHEST 1 VIEW COMPARISON:  11/14/2018 and older exams. FINDINGS: Left upper lobe consolidation is stable from the previous day's exam. No new lung abnormalities. No pneumothorax or pleural effusion. Stable right-sided Port-A-Cath. IMPRESSION: 1. No change from the previous day's study. 2. Left upper lobe consolidation which has worsened when compared to a chest radiograph dated 11/13/2018. Electronically Signed   By: Lajean Manes M.D.   On: 11/15/2018 13:48     Resolved Hospital Problem list     Assessment & Plan:   Hemoptysis - ongoing since admit 11/07/2018  And worsening LUL density with one episode fever and pitting various lesions in skin  - DDx - endobronchial lesion LUL V focal alveolar hemorrhage (has dropped hemoglobin) with possible vasculitis v pneumonic process - persistent issue = s/p skin bx this admitt  P - expand autoimmune workup (extensive at [facility] 2018 neg) - recheck ESR , check GBM, check DSDNA, SSa,ssb, scl-70- - check PCT, urine strep, urine leg due to fever - await skin biopsy - recommend diprivan based bronch for rule out endobronchial lesion v BAL alveolar hemorrhage  - d/w Dr Rodman Pickle - she will set this up for sometime during week of 11/17/2018 in OR  - hold off on CT  - not sure she needs o2 (she thinks and says she needs) - check ambulator pulse ox  -Scheduled for OP appointment with [facility] in March, 2020    > 50% of this > 40 min visit spent in face to face counseling or/and coordination of care - by this undersigned MD - Dr Brand Males. This includes one or more of the following documented above: discussion of test results, diagnostic or treatment recommendations, prognosis, risks and benefits of management options, instructions, education, compliance or risk-factor  reduction   Best practice:  Diet: NPO Pain/Anxiety/Delirium protocol (if indicated): dilaudid, ultram  VAP protocol (if indicated): n/a DVT prophylaxis: lovenox GI prophylaxis: none Glucose control: monitor   Mobility: ad lib  Code Status: Full  Family Communication: patient at bedside Disposition: continue current level of care      SIGNATURE    Dr. Brand Males, M.D., F.C.C.P,  Pulmonary and Critical Care Medicine Staff Physician, [place] Director - Interstitial Lung Disease  Program  Pulmonary [place]  at [facility], [place], [address]  Pager: [phone], If no answer or between  15:00h - 7:00h: call [phone] Telephone: [phone]  6:16 PM 11/16/2018

## 2018-11-16 NOTE — Plan of Care (Signed)
  Problem: Pain Managment: Goal: General experience of comfort will improve Outcome: Progressing   Problem: Safety: Goal: Ability to remain free from injury will improve Outcome: Progressing   Problem: Skin Integrity: Goal: Risk for impaired skin integrity will decrease Outcome: Progressing

## 2018-11-17 ENCOUNTER — Inpatient Hospital Stay (HOSPITAL_COMMUNITY): Payer: BLUE CROSS/BLUE SHIELD

## 2018-11-17 DIAGNOSIS — C8190 Hodgkin lymphoma, unspecified, unspecified site: Secondary | ICD-10-CM

## 2018-11-17 LAB — CBC WITH DIFFERENTIAL/PLATELET
Abs Immature Granulocytes: 0.04 10*3/uL (ref 0.00–0.07)
Basophils Absolute: 0 10*3/uL (ref 0.0–0.1)
Basophils Relative: 0 %
Eosinophils Absolute: 0 10*3/uL (ref 0.0–0.5)
Eosinophils Relative: 0 %
HEMATOCRIT: 34.1 % — AB (ref 36.0–46.0)
Hemoglobin: 10.6 g/dL — ABNORMAL LOW (ref 12.0–15.0)
Immature Granulocytes: 1 %
Lymphocytes Relative: 11 %
Lymphs Abs: 0.9 10*3/uL (ref 0.7–4.0)
MCH: 29 pg (ref 26.0–34.0)
MCHC: 31.1 g/dL (ref 30.0–36.0)
MCV: 93.4 fL (ref 80.0–100.0)
MONOS PCT: 6 %
Monocytes Absolute: 0.5 10*3/uL (ref 0.1–1.0)
Neutro Abs: 6.8 10*3/uL (ref 1.7–7.7)
Neutrophils Relative %: 82 %
Platelets: 300 10*3/uL (ref 150–400)
RBC: 3.65 MIL/uL — ABNORMAL LOW (ref 3.87–5.11)
RDW: 13.2 % (ref 11.5–15.5)
WBC: 8.2 10*3/uL (ref 4.0–10.5)
nRBC: 0 % (ref 0.0–0.2)

## 2018-11-17 LAB — COMPREHENSIVE METABOLIC PANEL
ALT: 35 U/L (ref 0–44)
AST: 29 U/L (ref 15–41)
Albumin: 2.9 g/dL — ABNORMAL LOW (ref 3.5–5.0)
Alkaline Phosphatase: 68 U/L (ref 38–126)
Anion gap: 9 (ref 5–15)
BILIRUBIN TOTAL: 0.5 mg/dL (ref 0.3–1.2)
BUN: 14 mg/dL (ref 6–20)
CO2: 24 mmol/L (ref 22–32)
Calcium: 8.3 mg/dL — ABNORMAL LOW (ref 8.9–10.3)
Chloride: 105 mmol/L (ref 98–111)
Creatinine, Ser: 0.85 mg/dL (ref 0.44–1.00)
GFR calc Af Amer: >60 mL/min (ref >60)
Glucose, Bld: 193 mg/dL — ABNORMAL HIGH (ref 70–99)
Potassium: 3.7 mmol/L (ref 3.5–5.1)
Sodium: 138 mmol/L (ref 135–145)
TOTAL PROTEIN: 6.6 g/dL (ref 6.5–8.1)

## 2018-11-17 LAB — PHOSPHORUS: Phosphorus: 2.7 mg/dL (ref 2.5–4.6)

## 2018-11-17 LAB — SEDIMENTATION RATE: Sed Rate: 90 mm/hr — ABNORMAL HIGH (ref 0–22)

## 2018-11-17 LAB — MAGNESIUM: MAGNESIUM: 2.3 mg/dL (ref 1.7–2.4)

## 2018-11-17 LAB — PROCALCITONIN: Procalcitonin: <0.1 ng/mL

## 2018-11-17 MED ORDER — OSELTAMIVIR PHOSPHATE 75 MG PO CAPS
75.0000 mg | ORAL_CAPSULE | Freq: Two times a day (BID) | ORAL | Status: AC
  Administered 2018-11-17 – 2018-11-18 (×3): 75 mg via ORAL
  Filled 2018-11-17 (×4): qty 1

## 2018-11-17 MED ORDER — IPRATROPIUM-ALBUTEROL 0.5-2.5 (3) MG/3ML IN SOLN
3.0000 mL | Freq: Two times a day (BID) | RESPIRATORY_TRACT | Status: DC
  Administered 2018-11-18: 3 mL via RESPIRATORY_TRACT
  Filled 2018-11-17 (×4): qty 3

## 2018-11-17 MED ORDER — SODIUM CHLORIDE 0.9 % IV SOLN
2.0000 g | Freq: Three times a day (TID) | INTRAVENOUS | Status: DC
  Administered 2018-11-17 – 2018-11-20 (×8): 2 g via INTRAVENOUS
  Filled 2018-11-17 (×10): qty 2

## 2018-11-17 MED ORDER — SODIUM CHLORIDE 0.9 % IV SOLN: Freq: Four times a day (QID) | INTRAVENOUS | Status: DC

## 2018-11-17 MED ORDER — HYDROCORTISONE NA SUCCINATE PF 100 MG IJ SOLR
50.0000 mg | Freq: Two times a day (BID) | INTRAMUSCULAR | Status: DC
  Administered 2018-11-18 – 2018-11-20 (×5): 50 mg via INTRAVENOUS
  Filled 2018-11-17 (×6): qty 1

## 2018-11-17 MED ORDER — SODIUM CHLORIDE 0.9 % IV SOLN
Freq: Four times a day (QID) | INTRAVENOUS | Status: DC | PRN
  Administered 2018-11-17 – 2018-11-20 (×8): via INTRAVENOUS
  Filled 2018-11-17 (×9): qty 1

## 2018-11-17 NOTE — Plan of Care (Signed)
  Problem: Pain Managment: Goal: General experience of comfort will improve Outcome: Progressing   Problem: Safety: Goal: Ability to remain free from injury will improve Outcome: Progressing

## 2018-11-17 NOTE — Progress Notes (Signed)
PROGRESS NOTE    MAYME PROFETA  YCX:[id number] DOB: 20-Jul-1971 DOA: 11/07/2018 PCP: Su Grand, MD   Brief Narrative:  CALDONIA LEAP is a 48 y.o. 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   She was first evaluated for blisters which developed into ulcerations of both hands by her PCP 08/2018.  At that time she was treated with one-time dose of Rocephin in the office followed by Keflex.  Due to subjective fevers at that time she was evaluated in the ED and discharged on clindamycin x 11days, left upper extremity ultrasound at that time showed associated superficial occlusive thrombus of left basilic vein with no DVT. ED evaluation at that time also included CTA negative for PE  Since then ambulatory referral to Rheumatology was placed by her primary oncologist. She has had persistent swelling in both hands without any new ulcerations until a few weeks ago with this current presentation.  A week ago noticed right middle finger blisters in middle of finger. It rsolved on its own and she covered with neosporin and dressed for a few days then it turned black with some purulent drainage. Then started noticing redness, numbness x 2 days. Pain extending in to hand during that time as  well.  E visit yesterday---who advised being seen by provider face to face to rule out systemic infection. Denies fevers, chills. Chronic nausea. No vomiting, abd pain, no history of trauma/injury to hand. No other ulcers. No joint pain or aches. No dysuria, no diarrhea/constipation  She continues to have episodes of small-volume hemoptysis while coughing.  Reports some increased shortness of breath.  No fevers or chills.  Also shows new small lesions on left wrist and left forearm and right forearm..  **ID, Pulmonary and Hand Surgery were consulted and workup still pending. She was attempted to undergo Bronchoscopy on 2/14 at 2:00 pm but per my Conversation with Dr. ELoanne Drillingwas unable to due to tolerance.   Flu PCR is Negative x2 but ID recommending treatment dosing. ID recommended General Surgery to biopsy her skin ulcer on her arm and current feeling is that it is 2/2 to a Vasculitic Process. She spiked a temperature overnight on 2/12-2/12 and so Blood Cx were ordered but never drawn so they were ordered again.   Still feels bad and and became febrile and was coughing and feeling more congested. CXR shows worsened LUL consolidation so Abx were broadened to IV Cefepime and IV Metronidazole. Still not feeling well so will place on Stress Dose Steroids now with IV Solucortef 50 mg q8h. Pulmonary evaluated and feel her LUL Density could be focal alveolhar hemorrhage with possible vasculitis vs. Pneumonic process are are planing a Bronchosopy under propofol sedation in the OR to r/o Endobronchial lesion vs. BAL alveolar hemorrhage and this is going to be set up this week. Pulmonary recommending continuing IV Cefepime and Flagyl and have  repeated autoimmune labs and are following   Assessment & Plan:   Principal Problem:   Cellulitis of right middle finger Active Problems:   Hodgkin lymphoma ([place])   Breast cancer ([place])   Thyroid cancer (HCC)   Adrenal insufficiency (HCC)   Hypertension    Hyperlipidemia   Edema   Right heart failure (HCC)   Nephrolithiasis   Oxygen dependent   Migraine   Hemoptysis   SOB (shortness of breath)   FUO (fever of unknown origin)   HCAP (healthcare-associated pneumonia)  Right third finger ulceration, with Cellulitis, improving  -X-ray imaging shows involvement of subcutaneous tissue with no obvious involvement with bone.   -Dr. Amedeo Plenty with surgery following no signs of abscess/compartment syndrome or lymphangitis.   -Given she had no sepsis physiology and no obvious worsening erythema she was transitioned to doxycycline on 2/10 but now has new lesions on left wrist and forearm and right forearm (slight abrasions with some surrounding erythema).   -Discontinued Doxycycline and started Clindamycin since she has done well with this in the past (prior presentation at [facility] during November 2019)and closely monitor over next 24 hours to ensure no fevers or worsening clinical status but discussed with ID Dr. Tommy Medal who recommended changing to Cefazolin and obtaining an MRI of the Hand -MRI of Right Hand ordered along with Inflammatory Markers and (ESR was 18 and CRP was 1.3) and along with ANCA titers, Rheumatoid Factor; ID added other Autoimmune studies including Anti-phospholidid Ab and SSA/SSB -Also obtaining UDS  -TTE negative for vegetation, no DVT in R arm, no clot in subclavian.  -Blood cultures ordered because she became febrile at 100.8 but patient refused them -IV Abx changed from Cefazolin now to IV Cefepime and Metronidazole  -WBC also elevated to 14,000 and worsened to 17,100 and now is trending back down 8,200  Left Upper Lobe Pneumonia/Density with concern for Focal Alveolar Hemorrhage with Possible Vascilitis vs. Pnemonic Process -Blood cultures ordered because she became febrile at 100.8 but patient refused them when she spiked a tempreature but did allow [place] to get a Left Thumb Blood Cx -WBC also elevated to 14,000 and worsened to  17,100 and now is trending back down 8,200 -Further Abx Recommendations per ID; Influenza Negative via PCR but ID feels as if this may be a False positive and ID recommends that if she goes for Bronchoscopy to send a new PCR from BAL for Influenza; Unable to Do Bronchoscopy under normal circumstances and repeat Influenza Panel Negative -Respiratory Virus Panel Negative  -She has been started on prophylatic Influenza Dosing due to concern of Nosocomial Spread as her neighbors in the hospital have the Flu but this was changed to Treatment dosing per ID Recommendations  -Will get Strep A Cx -Continue to Monitor and Further Abx treatment course per ID -CXR this AM showed Stable LEFT UPPER LOBE pneumonia.  No new abnormalities. -Broadened Abx as above; Started Flutter Valve, Incentive Spirometry, Guaifenesin  -C/w Budesonide 0.5 mg Neb BID and Breo-Ellipta -PCT is <0.10 -Pulmonary Re-evaluated and they recommend continue empiric cefepime and Flagyl.  She has a left upper lobe density with fever and pitting lesions on the skin and there is concern for a focal alveolar hemorrhage with possible vasculitic process versus pneumonic process -Pulmonary repeated autoimmune labs and are going to scheduled the patient later on this week for a bronchoscopy with dipper Lucianne Lei to rule out endobronchial lesion versus alveolar hemorrhage and can consider biopsy during the bronc to assess for possible  underlying vasculitis  Small volume hemoptysis episodes -Becoming more frequent.  Occurring with cough.   -CT chest negative for any new masses, chronic nodule shown.   -Sending for culture to assess for potential infection given some diminished breath sounds in left bases.  -CXR showed Small airspace opacity on the left that was not seen on chest CT 3 days ago and consistent with an inflammatory process, presumably pneumonia. -Consulted Pulmonary for further evaluation and recommendations and defer to them for further  workup -Pulmonary planning on doing a Bronchoscopy and this was attempted today but patient stated she had problem with sedation and usually requires deep sedation with Propofol but it was attempted and then aborted. Dr. Cordelia Pen partner to follow up this weekend and ? If Bronchosopy is going to be rescheduled next week under Anesthesia -Per my conversation with Dr. Hale Bogus she feels as if the patient's Hempotypsis could be 2/2 to Nose bleeding but ? Related to this LUL Pneumonia vs. Focal Alveolar hemorrhage -Continue to Monitor and Pulmonary to setup Bronchoscopy with Anesthesia in the OR later this week -Dr. Chase Caller evaluated yesterday and feels that she has a left upper lobe density concerning for focal alveolar hemorrhage with possible vasculitis versus a pneumonic process -We will continue IV antibiotics at this time  Subacute history of blisters developing into ulcers and bilateral hand and arm swelling.   -Ongoing for 3 months.  Is unclear if this is related to some autoimmune process (has history of Raynaud's phenomenon).   -Increased Lasix to 40 mg BID and swelling is improving  -CCP was obtained on 08/2018 at ED vist and was unremarkable.   -HIV and ANA negative here. Will obtain ANCA Titers, Rhuematoid Factor, ESR and CRP and also obtain a UDS; As above; ANCA titers have been negative -So far Autoimmune Studies have been negative but pulmonary will be repeating them.  ESR is now 90 on repeat -RA Latex Turbid was 11.1 and repeat was 18.6 -Unclear family history as patient is adopted, does not have any other systemic symptoms (no joint aches, no joint pain,). -CT chest was negative for any new metastatic lesions or any lymphadenopathy  -Dr. Tommy Medal reached out to Dr. Amil Amen of Rheumatology who may offer more insight -D/C'd IV Cefazolin and and escalated Abx to IV Cefepime and Metronidazole  -General Surgery consulted for Biopsy of skin ulceration which only showed excoriation -Follow  up on studies ordered   -Will likely need to discuss with Hematology/Oncology given her previous Hx of Breast Cancer and Hodgkins Lymphoma about the possiblity of Paraneoplastic syndromes  -Has an outpatient Rheumatology referral scheduled in March will need to keep this appointment   Adrenal Insufficiency secondary to pituitary resection for adenoma (2002, status post gamma knife surgery for regrowth of tumor in 2015).   -Had originally Discontinued IV hydrocortisone and transitioned to oral stress dosing 20 mg of hydrocortisone x3 days followed by reinitiation of her home regimen 10 mg in the a.m. 5 mg in p.m. but was not done so her Steroids were  back to 20 mg in AM and 10 mg after Lunch for a short term; Because she is not feeling better and was slightly hypotensive will start back IV Solucortef at 50 mg q8h and then Wean Appropriately but for now we will continue on 50 mg every 8  Thrombosis of basilic vein (40/05/6760).   -Status post 14-day treatment Lovenox by oncologist in 08/2018 -Continue to Monitor   Chronic hypoxic respiratory failure, stable on home  3 L nasal cannula.  ? 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   -Followed by [facility] pulmonology.  Continue home Breo Pulmicort BID added here.  Albuterol PRN and scheduled DuoNeb -Pulmonary Folllowing and planning on doing Bronchoscopy  -Per [facility] Notes Dr. Geraldo Docker:  "She has already had much of the work-up for this (CT chest, PFTs, TTE, RHC--note she does not have PAH based on her RHC with wedge 16 and PA mean 20.) though we have not objectively confirmed it in our own clinic. A low sat was documented in the cancer center once early on (at rest) but she has not made it to a 6MWT and her CPET in 2017 was very normal in  terms of oxygenation. She cannot walk aggressively enough for a 6MWT now given her leg weakness. She thinks she can exercise vigorously on a bike and wants to repeat a CPET. This will also provide info about [place] cardiac [place] - CPET on room air" -Patient never presented for CPET -Yesterday AM she was a little Hypoxic but she has had episodes of normal saturation on room air during this admission -We will assess further with an ambulatory home O2 study to assess whether she truly needs oxygen prior to discharge  History of Aortic Stenosis.  -Documented in [facility]. Systolic murmur heard on exam. Last TTE at [facility] on 08/2018 showed no stenosis.  -No vegetation on TTE here, does have tricupsid valve structure/calcification moderate.   Chronic Nausea.   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   -Followed by Dr. Wendee Beavers in [place]  Stage IV Hodgkin's disease -Diagnosed at age 77.  Status post chemotherapy and mantle field radiotherapy  GERD -C/w Dexilant substitution   HLD,stable -C/w Home Rosuvastatin 10 mg po daily   Post-Surgical Hypothyroidism.  -TSH suppressed but not accurate given prior pituitary resection.  -Follow free T4.  -Continue home synthroid at current dose of 112 mcg   History of CHF with reduced EF. -Last TTE 08/2018 showed normal systolic and diastolic function. BNP here unremarkable.  -Continued home lasix of 20 mg po BID but now increased to 40 mg BID  -Strict I's/O's and Daily Weights; She is +6.181 Liters since admission; Weight's Not done  Normocytic Anemia  -Patient's Hb/Hct went from 12.1/39.1 -> 10.6/34.1 -Check Anemia Panel in the AM  -Continue to Monitor for S/Sx for Bleeding -Repeat CBC  in AM   Hypokalemia -Patient's potassium this morning was 3.7 -Continue to monitor and replete as necessary -Repeat CMP in a.m.  Hypophosphatemia -Patient's phosphorus level is currently 2.7 -Continue to monitor and replete as necessary -Repeat phosphorus level in a.m.  DVT prophylaxis: Lovenox Held due to hemoptysis  Code Status: FULL CODE Family Communication: No family present at bedside  Disposition Plan: Remain Inpatient for further workup and evaluation   Consultants:   Pulmonary  Infectious Diseases  Hand Surgery   General Surgery   Procedures:   None  Antimicrobials:  Anti-infectives (From admission, onward)   Start     Dose/Rate Route Frequency Ordered Stop   11/17/18 2200  oseltamivir (TAMIFLU) capsule 75 mg     75 mg Oral 2  times daily 11/17/18 1020 11/19/18 2159   11/17/18 1830  ceFEPIme (MAXIPIME) 2 g in sodium chloride 0.9 % 100 mL IVPB     2 g 200 mL/hr over 30 Minutes Intravenous Every 8 hours 11/17/18 1022     11/15/18 1030  ceFEPIme (MAXIPIME) 1 g in sodium chloride 0.9 % 100 mL IVPB  Status:  Discontinued     1 g 200 mL/hr over 30 Minutes Intravenous Every 8 hours 11/15/18 1008 11/17/18 1022   11/15/18 1000  oseltamivir (TAMIFLU) capsule 75 mg  Status:  Discontinued     75 mg Oral 2 times daily 11/15/18 0849 11/17/18 0834   11/15/18 0900  metroNIDAZOLE (FLAGYL) IVPB 500 mg     500 mg 100 mL/hr over 60 Minutes Intravenous Every 8 hours 11/15/18 0849     11/14/18 1000  oseltamivir (TAMIFLU) capsule 75 mg  Status:  Discontinued     75 mg Oral Daily 11/14/18 0833 11/15/18 0849   11/12/18 2200  ceFAZolin (ANCEF) IVPB 1 g/50 mL premix  Status:  Discontinued     1 g 100 mL/hr over 30 Minutes Intravenous Every 8 hours 11/12/18 1841 11/15/18 0849   11/12/18 1930  ceFAZolin (ANCEF) IVPB 1 g/50 mL premix  Status:  Discontinued     1 g 100 mL/hr over 30 Minutes Intravenous Every 8 hours 11/12/18 1838 11/12/18 1841   11/11/18 1400  clindamycin (CLEOCIN)  IVPB 600 mg  Status:  Discontinued     600 mg 100 mL/hr over 30 Minutes Intravenous Every 8 hours 11/11/18 1014 11/12/18 1814   11/10/18 1645  doxycycline (VIBRA-TABS) tablet 100 mg  Status:  Discontinued     100 mg Oral Every 12 hours 11/10/18 1641 11/11/18 1014   11/08/18 1000  vancomycin (VANCOCIN) IVPB 1000 mg/200 mL premix  Status:  Discontinued     1,000 mg 200 mL/hr over 60 Minutes Intravenous Every 12 hours 11/07/18 2115 11/09/18 1545   11/07/18 2130  vancomycin (VANCOCIN) IVPB 750 mg/150 ml premix     750 mg 150 mL/hr over 60 Minutes Intravenous  Once 11/07/18 2110 11/07/18 2310   11/07/18 2000  vancomycin (VANCOCIN) IVPB 1000 mg/200 mL premix     1,000 mg 200 mL/hr over 60 Minutes Intravenous  Once 11/07/18 1955 11/07/18 2117     Subjective: Seen and examined at bedside and says that increasing her prednisone has helped her but she still feels very congested and tight.  Will has chronic nausea.  Thinks the swelling in her arms is improved.  No lightheadedness or dizziness.  Discussed about pulmonary repeating a bronchoscopy and this is to happen later this week maybe Wednesday or Thursday.  No other concerns or complaints at this time  Objective: Vitals:   11/17/18 0459 11/17/18 0738 11/17/18 1410 11/17/18 1450  BP:   131/78   Pulse: 84  (!) 102   Resp:   17   Temp:   98.1 F (36.7 C)   TempSrc:   Oral   SpO2: 100% 98% 98% 97%  Weight:      Height:        Intake/Output Summary (Last 24 hours) at 11/17/2018 1559 Last data filed at 11/17/2018 1300 Gross per 24 hour  Intake 480 ml  Output -  Net 480 ml   Filed Weights   11/07/18 1635  Weight: 80 kg   Examination: Physical Exam:  Constitutional: Well-nourished, well-developed overweight Caucasian female currently no acute distress laying in bed but appears uncomfortable Eyes: Lids and  conjunctive are normal.  Sclerae anicteric ENMT: External ears and nose appear normal.  Grossly normal hearing Neck: Appears  supple no JVD Respiratory: Diminished bilaterally with no appreciable wheezing but does have some coarse breath sounds and has some upper lobe rhonchi on the left.  She is not tachypneic but she is wearing supplemental oxygen via nasal cannula Cardiovascular: Mildly tachycardic rate but regular rhythm.  Has some trace to 1+ lower extremity edema. Abdomen: Soft, nontender, distended secondary body habitus.  Bowel sounds present GU: Deferred Musculoskeletal: No contractures or cyanosis.  No joint deformities Skin: No appreciable rashes but does have some extremity edema in the upper arms but is improved significantly with some mild blistering and a swollen right middle finger with some ulceration.  No significant erythema noted Neurologic: Cranial nerves II through XII grossly intact with no appreciable focal deficits Psychiatric: Depressed appearing mood and flat affect.  Intact judgment and insight.  She is awake and alert and oriented x3  Data Reviewed: I have personally reviewed following labs and imaging studies  CBC: Recent Labs  Lab 11/13/18 0512 11/14/18 0403 11/15/18 0500 11/16/18 1029 11/17/18 0415  WBC 14.0* 17.1* 12.7* 9.5 8.2  NEUTROABS 10.8* 12.3* 9.4* 6.4 6.8  HGB 11.2* 11.1* 10.2* 10.3* 10.6*  HCT 36.5 34.9* 33.4* 34.4* 34.1*  MCV 91.7 92.3 94.1 94.8 93.4  PLT 268 250 246 281 552   Basic Metabolic Panel: Recent Labs  Lab 11/13/18 0512 11/14/18 0403 11/15/18 0500 11/16/18 1029 11/17/18 0415  NA 135 136 136 139 138  K 3.6 3.1* 3.5 3.4* 3.7  CL 101 103 103 107 105  CO2 _0 GLUCOSE 130* 121* 115* 102* 193*  BUN _1 CREATININE 1.03* 0.91 0.99 0.90 0.85  CALCIUM 8.9 8.5* 8.2* 8.6* 8.3*  MG 1.8 2.0 2.3 2.2 2.3  PHOS 3.0 2.1* 2.4* 3.3 2.7   GFR: Estimated Creatinine Clearance: 85.5 mL/min (by C-G formula based on SCr of 0.85 mg/dL). Liver Function Tests: Recent Labs  Lab 11/13/18 0512 11/14/18 0403 11/15/18 0500 11/16/18 1029  11/17/18 0415  AST _2 ALT _3 33 35  ALKPHOS 44 50 52 59 68  BILITOT 0.6 0.8 0.4 0.4 0.5  PROT 6.5 6.7 6.6 7.0 6.6  ALBUMIN 3.3* 3.2* 2.9* 3.0* 2.9*   No results for input(s): LIPASE, AMYLASE in the last 168 hours. No results for input(s): AMMONIA in the last 168 hours. Coagulation Profile: No results for input(s): INR, PROTIME in the last 168 hours. Cardiac Enzymes: No results for input(s): CKTOTAL, CKMB, CKMBINDEX, TROPONINI in the last 168 hours. BNP (last 3 results) No results for input(s): PROBNP in the last 8760 hours. HbA1C: No results for input(s): HGBA1C in the last 72 hours. CBG: Recent Labs  Lab 11/12/18 1216 11/13/18 1116  GLUCAP 94 112*   Lipid Profile: No results for input(s): CHOL, HDL, LDLCALC, TRIG, CHOLHDL, LDLDIRECT in the last 72 hours. Thyroid Function Tests: No results for input(s): TSH, T4TOTAL, FREET4, T3FREE, THYROIDAB in the last 72 hours. Anemia Panel: No results for input(s): VITAMINB12, FOLATE, FERRITIN, TIBC, IRON, RETICCTPCT in the last 72 hours. Sepsis Labs: Recent Labs  Lab 11/17/18 0415  PROCALCITON <0.10    Recent Results (from the past 240 hour(s))  Expectorated sputum assessment w rflx to resp cult     Status: None   Collection Time: 11/11/18  3:21 PM  Result Value Ref Range Status   Specimen Description  EXPECTORATED SPUTUM  Final   Special Requests NONE  Final   Sputum evaluation   Final    Sputum specimen not acceptable for testing.  Please recollect.   RESULT CALLED TO, READ BACK BY AND VERIFIED WITH: R RAKHIMOVA RN 2255 11/11/18 A BROWNING Performed at [facility], [place] [address], [place], [place] [address]    Report Status 11/11/2018 FINAL  Final  Expectorated sputum assessment w rflx to resp cult     Status: None   Collection Time: 11/12/18  3:31 AM  Result Value Ref Range Status   Specimen Description EXPECTORATED SPUTUM  Final   Special Requests NONE  Final   Sputum evaluation   Final     Sputum specimen not acceptable for testing.  Please recollect.   RESULT CALLED TO, READ BACK BY AND VERIFIED WITH: RALCHIMOVA RN 11/12/18 0605 JDW Performed at [place] [address], [place], [place] [address]    Report Status 11/12/2018 FINAL  Final  Culture, blood (routine x 2)     Status: None (Preliminary result)   Collection Time: 11/14/18  9:03 AM  Result Value Ref Range Status   Specimen Description BLOOD LEFT THUMB  Final   Special Requests   Final    BOTTLES DRAWN AEROBIC ONLY Blood Culture adequate volume   Culture   Final    NO GROWTH 3 DAYS Performed at [facility], [place] [address] [place], [place] [address]    Report Status PENDING  Incomplete  Respiratory Panel by PCR     Status: None   Collection Time: 11/15/18  8:20 AM  Result Value Ref Range Status   Adenovirus NOT DETECTED NOT DETECTED Final   Coronavirus 229E NOT DETECTED NOT DETECTED Final    Comment: (NOTE) The Coronavirus on the Respiratory Panel, DOES NOT test for the novel  Coronavirus (2019 nCoV)    Coronavirus HKU1 NOT DETECTED NOT DETECTED Final   Coronavirus NL63 NOT DETECTED NOT DETECTED Final   Coronavirus OC43 NOT DETECTED NOT DETECTED Final   Metapneumovirus NOT DETECTED NOT DETECTED Final   Rhinovirus / Enterovirus NOT DETECTED NOT DETECTED Final   Influenza A NOT DETECTED NOT DETECTED Final   Influenza B NOT DETECTED NOT DETECTED Final   Parainfluenza Virus 1 NOT DETECTED NOT DETECTED Final   Parainfluenza Virus 2 NOT DETECTED NOT DETECTED Final   Parainfluenza Virus 3 NOT DETECTED NOT DETECTED Final   Parainfluenza Virus 4 NOT DETECTED NOT DETECTED Final   Respiratory Syncytial Virus NOT DETECTED NOT DETECTED Final   Bordetella pertussis NOT DETECTED NOT DETECTED Final   Chlamydophila pneumoniae NOT DETECTED NOT DETECTED Final   Mycoplasma pneumoniae NOT DETECTED NOT DETECTED Final    Comment: Performed at [facility], [address] [place], [place] [address]      Radiology Studies: Dg Chest Port 1 View  Result Date: 11/17/2018 CLINICAL DATA:  Shortness of breath.  Pneumonia. EXAM: PORTABLE CHEST 1 VIEW COMPARISON:  Multiple chest x-rays since November 13, 2018. FINDINGS: The infiltrate in the lateral left mid lung is similar since yesterday but increased since November 13, 2018. No pneumothorax. Stable right Port-A-Cath. The cardiomediastinal silhouette is unchanged. IMPRESSION: Stable infiltrate in the periphery of the left mid lung, similar in the interval. In the appropriate clinical setting, this would be consistent with pneumonia. Electronically Signed   By: Dorise Bullion III M.D   On: 11/17/2018 08:17   Dg Chest Port 1 View  Result Date:  11/16/2018 CLINICAL DATA:  Shortness of breath. Follow-up LEFT UPPER LOBE pneumonia. EXAM: PORTABLE CHEST 1 VIEW COMPARISON:  11/15/2018 and earlier. FINDINGS: Heart size upper normal, unchanged. Airspace consolidation in the LEFT UPPER LOBE, unchanged since yesterday. No new pulmonary parenchymal abnormalities. No pleural effusions. RIGHT jugular Port-A-Cath tip projects at or near the cavoatrial junction, unchanged. Surgical clips in both axilla from prior node dissection. IMPRESSION: Stable LEFT UPPER LOBE pneumonia.  No new abnormalities. Electronically Signed   By: Evangeline Dakin M.D.   On: 11/16/2018 09:25    Scheduled Meds: . budesonide  0.5 mg Inhalation BID  . clonazePAM  1 mg Oral TID  . escitalopram  20 mg Oral Daily  . estradiol  2 mg Oral Daily  . fluticasone furoate-vilanterol  1 puff Inhalation Daily  . furosemide  40 mg Oral BID  . guaiFENesin  1,200 mg Oral BID  . hydrocortisone sod succinate (SOLU-CORTEF) inj  50 mg Intravenous Q8H  . ipratropium-albuterol  3 mL Nebulization TID  . levothyroxine  112 mcg Oral QAC breakfast  . metoprolol succinate  37.5 mg Oral Daily  . oseltamivir  75 mg Oral BID  . potassium citrate  10 mEq Oral Daily  . rosuvastatin  10 mg Oral Daily  . sodium chloride  flush  10-40 mL Intracatheter Q12H  . topiramate  150 mg Oral QHS   Continuous Infusions: . sodium chloride 10 mL/hr at 11/15/18 0553  . ceFEPime (MAXIPIME) IV    . metronidazole 500 mg (11/17/18 3846)    LOS: 10 days   Kerney Elbe, DO [facility] PAGER is on [place]  If 7PM-7AM, please contact night-coverage [email or web address] Password [facility] 11/17/2018, 3:59 PM

## 2018-11-17 NOTE — Progress Notes (Signed)
Subjective:   Still continues to feel poorly.  Antibiotics:  Anti-infectives (From admission, onward)   Start     Dose/Rate Route Frequency Ordered Stop   11/17/18 2200  oseltamivir (TAMIFLU) capsule 75 mg     75 mg Oral 2 times daily 11/17/18 1020 11/19/18 2159   11/17/18 1830  ceFEPIme (MAXIPIME) 2 g in sodium chloride 0.9 % 100 mL IVPB     2 g 200 mL/hr over 30 Minutes Intravenous Every 8 hours 11/17/18 1022     11/15/18 1030  ceFEPIme (MAXIPIME) 1 g in sodium chloride 0.9 % 100 mL IVPB  Status:  Discontinued     1 g 200 mL/hr over 30 Minutes Intravenous Every 8 hours 11/15/18 1008 11/17/18 1022   11/15/18 1000  oseltamivir (TAMIFLU) capsule 75 mg  Status:  Discontinued     75 mg Oral 2 times daily 11/15/18 0849 11/17/18 0834   11/15/18 0900  metroNIDAZOLE (FLAGYL) IVPB 500 mg     500 mg 100 mL/hr over 60 Minutes Intravenous Every 8 hours 11/15/18 0849     11/14/18 1000  oseltamivir (TAMIFLU) capsule 75 mg  Status:  Discontinued     75 mg Oral Daily 11/14/18 0833 11/15/18 0849   11/12/18 2200  ceFAZolin (ANCEF) IVPB 1 g/50 mL premix  Status:  Discontinued     1 g 100 mL/hr over 30 Minutes Intravenous Every 8 hours 11/12/18 1841 11/15/18 0849   11/12/18 1930  ceFAZolin (ANCEF) IVPB 1 g/50 mL premix  Status:  Discontinued     1 g 100 mL/hr over 30 Minutes Intravenous Every 8 hours 11/12/18 1838 11/12/18 1841   11/11/18 1400  clindamycin (CLEOCIN) IVPB 600 mg  Status:  Discontinued     600 mg 100 mL/hr over 30 Minutes Intravenous Every 8 hours 11/11/18 1014 11/12/18 1814   11/10/18 1645  doxycycline (VIBRA-TABS) tablet 100 mg  Status:  Discontinued     100 mg Oral Every 12 hours 11/10/18 1641 11/11/18 1014   11/08/18 1000  vancomycin (VANCOCIN) IVPB 1000 mg/200 mL premix  Status:  Discontinued     1,000 mg 200 mL/hr over 60 Minutes Intravenous Every 12 hours 11/07/18 2115 11/09/18 1545   11/07/18 2130  vancomycin (VANCOCIN) IVPB 750 mg/150 ml premix     750 mg 150  mL/hr over 60 Minutes Intravenous  Once 11/07/18 2110 11/07/18 2310   11/07/18 2000  vancomycin (VANCOCIN) IVPB 1000 mg/200 mL premix     1,000 mg 200 mL/hr over 60 Minutes Intravenous  Once 11/07/18 1955 11/07/18 2117      Medications: Scheduled Meds: . budesonide  0.5 mg Inhalation BID  . clonazePAM  1 mg Oral TID  . escitalopram  20 mg Oral Daily  . estradiol  2 mg Oral Daily  . fluticasone furoate-vilanterol  1 puff Inhalation Daily  . furosemide  40 mg Oral BID  . guaiFENesin  1,200 mg Oral BID  . [START ON 11/18/2018] hydrocortisone sod succinate (SOLU-CORTEF) inj  50 mg Intravenous Q12H  . ipratropium-albuterol  3 mL Nebulization TID  . levothyroxine  112 mcg Oral QAC breakfast  . metoprolol succinate  37.5 mg Oral Daily  . oseltamivir  75 mg Oral BID  . potassium citrate  10 mEq Oral Daily  . rosuvastatin  10 mg Oral Daily  . sodium chloride flush  10-40 mL Intracatheter Q12H  . topiramate  150 mg Oral QHS   Continuous Infusions: . sodium chloride 10 mL/hr  at 11/15/18 0553  . ceFEPime (MAXIPIME) IV    . small volume/piggyback builder    . metronidazole 500 mg (11/17/18 1627)   PRN Meds:.acetaminophen, albuterol, cyclobenzaprine, small volume/piggyback builder, fentaNYL (SUBLIMAZE) injection, midazolam, phenol, sodium chloride flush, sodium chloride flush, sucralfate, traMADol, zolpidem    Objective: Weight change:   Intake/Output Summary (Last 24 hours) at 11/17/2018 1637 Last data filed at 11/17/2018 1300 Gross per 24 hour  Intake 480 ml  Output -  Net 480 ml   Blood pressure 131/78, pulse (!) 102, temperature 98.1 F (36.7 C), temperature source Oral, resp. rate 17, height _0  (1.651 m), weight 80 kg, SpO2 97 %. Temp:  [97.6 F (36.4 C)-98.1 F (36.7 C)] 98.1 F (36.7 C) (02/17 1410) Pulse Rate:  [84-102] 102 (02/17 1410) Resp:  [17-20] 17 (02/17 1410) BP: (115-131)/(72-81) 131/78 (02/17 1410) SpO2:  [77 %-100 %] 97 % (02/17 1450)  Physical  Exam: General: Alert and awake, oriented x3, not in any acute distress. HEENT: anicteric sclera, EOMI CVS regular rate, normal  Chest: Rhonchorous airway sounds throughout her lungs  Abdomen: soft non-distended,  Extremities: no edema or deformity noted bilaterally  Neuro: nonfocal  Skin exam 11/14/2018:    Biopsy site was clean   CBC:    BMET Recent Labs    11/16/18 1029 11/17/18 0415  NA 139 138  K 3.4* 3.7  CL 107 105  CO2 22 24  GLUCOSE 102* 193*  BUN 14 14  CREATININE 0.90 0.85  CALCIUM 8.6* 8.3*     Liver Panel  Recent Labs    11/16/18 1029 11/17/18 0415  PROT 7.0 6.6  ALBUMIN 3.0* 2.9*  AST 31 29  ALT 33 35  ALKPHOS 59 68  BILITOT 0.4 0.5       Sedimentation Rate Recent Labs    11/17/18 0415  ESRSEDRATE 90*   C-Reactive Protein No results for input(s): CRP in the last 72 hours.  Micro Results: Recent Results (from the past 720 hour(s))  Expectorated sputum assessment w rflx to resp cult     Status: None   Collection Time: 11/11/18  3:21 PM  Result Value Ref Range Status   Specimen Description EXPECTORATED SPUTUM  Final   Special Requests NONE  Final   Sputum evaluation   Final    Sputum specimen not acceptable for testing.  Please recollect.   RESULT CALLED TO, READ BACK BY AND VERIFIED WITH: R RAKHIMOVA RN 2255 11/11/18 A BROWNING Performed at [facility], [place] [address] [place], [place] [address]    Report Status 11/11/2018 FINAL  Final  Expectorated sputum assessment w rflx to resp cult     Status: None   Collection Time: 11/12/18  3:31 AM  Result Value Ref Range Status   Specimen Description EXPECTORATED SPUTUM  Final   Special Requests NONE  Final   Sputum evaluation   Final    Sputum specimen not acceptable for testing.  Please recollect.   RESULT CALLED TO, READ BACK BY AND VERIFIED WITH: RALCHIMOVA RN 11/12/18 0605 JDW Performed at [place] [address], [place], [place] [address]    Report Status  11/12/2018 FINAL  Final  Culture, blood (routine x 2)     Status: None (Preliminary result)   Collection Time: 11/14/18  9:03 AM  Result Value Ref Range Status   Specimen Description BLOOD LEFT THUMB  Final   Special Requests   Final    BOTTLES DRAWN AEROBIC ONLY Blood  Culture adequate volume   Culture   Final    NO GROWTH 3 DAYS Performed at [facility], [place] [address] [place], [place] [address]    Report Status PENDING  Incomplete  Respiratory Panel by PCR     Status: None   Collection Time: 11/15/18  8:20 AM  Result Value Ref Range Status   Adenovirus NOT DETECTED NOT DETECTED Final   Coronavirus 229E NOT DETECTED NOT DETECTED Final    Comment: (NOTE) The Coronavirus on the Respiratory Panel, DOES NOT test for the novel  Coronavirus (2019 nCoV)    Coronavirus HKU1 NOT DETECTED NOT DETECTED Final   Coronavirus NL63 NOT DETECTED NOT DETECTED Final   Coronavirus OC43 NOT DETECTED NOT DETECTED Final   Metapneumovirus NOT DETECTED NOT DETECTED Final   Rhinovirus / Enterovirus NOT DETECTED NOT DETECTED Final   Influenza A NOT DETECTED NOT DETECTED Final   Influenza B NOT DETECTED NOT DETECTED Final   Parainfluenza Virus 1 NOT DETECTED NOT DETECTED Final   Parainfluenza Virus 2 NOT DETECTED NOT DETECTED Final   Parainfluenza Virus 3 NOT DETECTED NOT DETECTED Final   Parainfluenza Virus 4 NOT DETECTED NOT DETECTED Final   Respiratory Syncytial Virus NOT DETECTED NOT DETECTED Final   Bordetella pertussis NOT DETECTED NOT DETECTED Final   Chlamydophila pneumoniae NOT DETECTED NOT DETECTED Final   Mycoplasma pneumoniae NOT DETECTED NOT DETECTED Final    Comment: Performed at [facility], [address], [place], [place] [address]    Studies/Results: Dg Chest Port 1 View  Result Date: 11/17/2018 CLINICAL DATA:  Shortness of breath.  Pneumonia. EXAM: PORTABLE CHEST 1 VIEW COMPARISON:  Multiple chest x-rays since November 13, 2018. FINDINGS: The infiltrate in the lateral  left mid lung is similar since yesterday but increased since November 13, 2018. No pneumothorax. Stable right Port-A-Cath. The cardiomediastinal silhouette is unchanged. IMPRESSION: Stable infiltrate in the periphery of the left mid lung, similar in the interval. In the appropriate clinical setting, this would be consistent with pneumonia. Electronically Signed   By: Dorise Bullion III M.D   On: 11/17/2018 08:17   Dg Chest Port 1 View  Result Date: 11/16/2018 CLINICAL DATA:  Shortness of breath. Follow-up LEFT UPPER LOBE pneumonia. EXAM: PORTABLE CHEST 1 VIEW COMPARISON:  11/15/2018 and earlier. FINDINGS: Heart size upper normal, unchanged. Airspace consolidation in the LEFT UPPER LOBE, unchanged since yesterday. No new pulmonary parenchymal abnormalities. No pleural effusions. RIGHT jugular Port-A-Cath tip projects at or near the cavoatrial junction, unchanged. Surgical clips in both axilla from prior node dissection. IMPRESSION: Stable LEFT UPPER LOBE pneumonia.  No new abnormalities. Electronically Signed   By: Evangeline Dakin M.D.   On: 11/16/2018 09:25      Assessment/Plan:  INTERVAL HISTORY: Repeat flu PCR was negative Principal Problem:   Cellulitis of right middle finger Active Problems:   Hodgkin lymphoma (HCC)   Breast cancer (HCC)   Thyroid cancer (HCC)   Adrenal insufficiency (HCC)   Hypertension   Hyperlipidemia   Edema   Right heart failure (HCC)   Nephrolithiasis   Oxygen dependent   Migraine   Hemoptysis   SOB (shortness of breath)   FUO (fever of unknown origin)   HCAP (healthcare-associated pneumonia)    [name] is a 48 y.o. female with history of breast cancer, Hodgkin's lymphoma thyroid cancer pituitary adenoma who had chemotherapy-induced heart failure Raynaud's who developed unexplained upper extremity edema for several months now complicated by cutaneous ulcerations now with  also declaration of hemoptysis in the context of a chronic toxemic respiratory  failure of unknown cause.  He has been found to have a left upper lobe pneumonia   #1  Left upper lobe pneumonia  Continue to treat with cefepime and metronidazole as well as Tamiflu for influenza and (presumptive diagnosis)  And that she had 7 days of Tamiflu I am not completely confident the PCR is sufficiently sensitive to the fact influenza here.  I would prefer to keep treating her and continue with IP precautions.  #2 hemoptysis: Question whether this is all related to a nosocomial pneumonia or whether it has some thing informative to give [place] about her chronic pulmonary problems and her almost with lymphedema and skin ulcers over the last several months.  Bronchoscopy may happen on Monday in the operating room  Autoimmune work-up remains unremarkable   #2 Skin ulcers of unknown cause: Punch biopsy suggest that these could be due to excoriation.  We will follow peripherally until she has had bronchoscopy.      LOS: 10 days   Alcide Evener 11/17/2018, 4:37 PM

## 2018-11-17 NOTE — Plan of Care (Signed)
  Problem: Education: Goal: Knowledge of General Education information will improve Description Including pain rating scale, medication(s)/side effects and non-pharmacologic comfort measures Outcome: Progressing   Problem: Education: Goal: Knowledge of General Education information will improve Description Including pain rating scale, medication(s)/side effects and non-pharmacologic comfort measures Outcome: Progressing

## 2018-11-17 NOTE — Progress Notes (Signed)
NAME:  [name], MRN:  [id number], DOB:  14-Oct-1970, LOS: 22 ADMISSION DATE:  11/07/2018, CONSULTATION DATE:  11/12/18 REFERRING MD:  Teryl Lucy - [facility], CHIEF COMPLAINT:  Hemoptysis  Brief History   48 yo F  With PMH HTN, R sided heart failure, ?chronic hypoxic respiratory failure on home 3-4LNC, osteoporosis and extensive oncologic PMH including Hodgkin's lymphona in remission, Breast cancer with multiple recurrences s/p bilatearal mastectomy now in remission, pituitary adenoma s/p resection, thyroid cancer s/p thyroidectomy with resultant hypothyroidism, ?cervical cancer s/p LEEP who presented 2/7 for progressive BUE swelling. Swelling began multiple months before presentation and intermittently had associated spontaneously occurring and resolving blisters. Several weeks ago, the patient was started on abx (Keflex to [place]) for apparent cellulitis of an open blister. 1 week prior to presentation the patient reports the onset of multiple small blisters on right 3rd finger, which developed surrounding erythema and dark ulceration at time of presentation to ED.  The patient denied recent illness, fever, chills, but endorsed recent abx treatment as above. The patient reported decreased sensation in affected finger with associated decreased ROM.  In the ED the patient was started on vancomycin, hand surgery was consulted.   Over course of hospitalization, patient has been followed by ortho and IM. Ortho does not feel patient is a surgical candidate. Hospital course has been complicated by small volume hemoptysis x3. CTA chest negative for PE, but small lung nodule noted. PCCM consulted for evaluation.   Patient was recently hospitalized at [facility] and has several OP follow ups scheduled including anOP pulmonary appointment with Dr. Nancie Neas ([place]) in March, 2020.    Past Medical History  Addison's disease, Aortic stenosis, Appendicitis, Breast cancer s/p bilateral mastectomy, CHF, ?Chronic  hypoxic respiratory failure, GERD, Hodgkin lymphoma s/p mantle radiation, HTN, Migraine, Osteoporosis , Raynauds, Thyroid cancer s/p thyroidectomy, Hypothyroidism, Cervical cancer s/p LEEP, Lung nodules  Significant Hospital Events   2/7> admitted 2/10> CTA negative for PE. Stable pulmonary nodules compared to prior outside hospital CTs 2/13- Elevated temperature overnight TMax 100.4 Patient states she "feels like she got hit by a bus" and additionally states she "would feel much better with a bronch."  Consults:  Ortho PCCM  ID  Procedures:  2/16 > bronch, aborted due to pt discomfort  Significant Diagnostic Tests:  XR R Hand 2/7> soft tissue swelling with edema of dorsal metacarpal. No subcutaneous gas. No acute osseous abnormality  CT with contrast 2/10> negative for PE MRI hand> suggestive of soft tissue cellulitis second middle fourth finger. No oseto.  CXR 2/13> new small L opacity   Micro Data:  2/12 sputum cx>>  neg FluA/B > negative 2/13 BCx >> neg ............ 2/16 Urine strep 2/16 urine leg ............ 2/16 - additional Autoimmune - MPO neg, PR3, GBM, scl-70, ssa/ssb, RF positive (18.6)  Antimicrobials:  Clindamycin 2/11 Ancef 2/12 Cefepime 2/15 >  Flagyl 2/15 >   Interim history/subjective:  States "chest is still tight".  Has not had any hemoptysis since 2/15. States she does not want anymore IV pain meds (dilaudid) as she gets nauseous.  Would like to keep Ultram PO and IV benadryl on board.  Objective   Blood pressure 127/81, pulse 84, temperature 97.6 F (36.4 C), temperature source Oral, resp. rate 20, height _0  (1.651 m), weight 80 kg, SpO2 98 %.        Intake/Output Summary (Last 24 hours) at 11/17/2018 0821 Last data filed at 11/16/2018 1700 Gross per 24 hour  Intake 360 ml  Output -  Net 360 ml   Filed Weights   11/07/18 1635  Weight: 80 kg   Physical Exam: General: Adult female, anxious, resting in bed, in NAD. Neuro: A&O x 3, no  deficits. HEENT: [place]/AT. Sclerae anicteric, EOMI. Cardiovascular: RRR, no M/R/G.  Lungs: Respirations even and unlabored.  CTA bilaterally, No W/R/R.  Abdomen: Obese.  BS x 4, soft, NT/ND.  Musculoskeletal: No gross deformities, no edema.  Skin: Bandages to left and right hands (pt states she has areas of "necrotic skin), small punch biopsy on left forearm noted without any surrounding erythema or drainage.  Skin otherwise warm, no rashes.   Assessment & Plan:   Hemoptysis - was ongoing since admit; however, none since 2/15 (? Response to stress steroids).  With worsening LUL density with one episode fever and pitting various lesions in skin (skin biopsy 2/13 showed excoriation). 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   - Continue on empiric cefepime / flagyl. - ID following. - F/u on repeat autoimmune labs (some still pending as of 2/17).  - Dr. Loanne Drilling to see and schedule bronch in OR for later this week (with diprivan) to rule out endobronchial lesion v alveolar hemorrhage.  Can consider biopsy during bronch to assess for underlying vasculitis.  - Hold off on CT.  ? Chronic hypoxic respiratory failure - pt states she needs O2; however, has had episodes of normal saturation on room air during this admit. - Assess ambulatory desaturation study to assess whether she truly needs O2.  Chronic pulmonary nodules - followed by Dr. Elenor Quinones at [facility] and Dr. Birdena Jubilee with thoracic oncology at [facility]. - Scheduled for OP appointment with [facility] in March 2020.  Chronic Pain. - D/c dilaudid. - Continue ultram.   Rest per primary team.  Best practice:  Diet: Reg Pain/Anxiety/Delirium protocol (if indicated): ultram  VAP protocol (if indicated):  n/a DVT prophylaxis: lovenox GI prophylaxis: none Glucose control: monitor   Mobility: ad lib  Code Status: Full  Family Communication: husband updated at bedside 2/17. Disposition: continue current level of care    Montey Hora, [facility] Pager: [phone].  If no answer, [phone] 11/17/2018, 9:03 AM

## 2018-11-17 NOTE — Progress Notes (Signed)
PHARMACY NOTE:  ANTIMICROBIAL RENAL DOSAGE ADJUSTMENT  Current antimicrobial regimen includes a mismatch between antimicrobial dosage and estimated renal function.  As per policy approved by the Pharmacy & Therapeutics and Medical Executive Committees, the antimicrobial dosage will be adjusted accordingly.  Current antimicrobial dosage:  Cefepime 1gm q 8 hours   Indication: HCAP  Renal Function:  Estimated Creatinine Clearance: 85.5 mL/min (by C-G formula based on SCr of 0.85 mg/dL). _0      On intermittent HD, scheduled: _1      On CRRT    Antimicrobial dosage has been changed to:  Cefepime 2 gm q 8 hours  Additional comments: Empiric coverage for pseudomonas   Thank you for allowing pharmacy to be a part of this patient's care.  Jimmy Footman, PharmD, BCPS, [facility] Clinical Pharmacist Phone: [phone] 11/17/2018 10:23 AM

## 2018-11-18 DIAGNOSIS — J189 Pneumonia, unspecified organism: Secondary | ICD-10-CM

## 2018-11-18 LAB — COMPREHENSIVE METABOLIC PANEL
ALT: 37 U/L (ref 0–44)
AST: 32 U/L (ref 15–41)
Albumin: 2.9 g/dL — ABNORMAL LOW (ref 3.5–5.0)
Alkaline Phosphatase: 63 U/L (ref 38–126)
Anion gap: 10 (ref 5–15)
BUN: 16 mg/dL (ref 6–20)
CO2: 23 mmol/L (ref 22–32)
Calcium: 8.8 mg/dL — ABNORMAL LOW (ref 8.9–10.3)
Chloride: 106 mmol/L (ref 98–111)
Creatinine, Ser: 0.94 mg/dL (ref 0.44–1.00)
GFR calc Af Amer: >60 mL/min (ref >60)
GFR calc non Af Amer: >60 mL/min (ref >60)
Glucose, Bld: 130 mg/dL — ABNORMAL HIGH (ref 70–99)
Potassium: 3.2 mmol/L — ABNORMAL LOW (ref 3.5–5.1)
Sodium: 139 mmol/L (ref 135–145)
Total Bilirubin: <0.1 mg/dL — ABNORMAL LOW (ref 0.3–1.2)
Total Protein: 6.6 g/dL (ref 6.5–8.1)

## 2018-11-18 LAB — PROCALCITONIN: Procalcitonin: <0.1 ng/mL

## 2018-11-18 LAB — SJOGRENS SYNDROME-B EXTRACTABLE NUCLEAR ANTIBODY: SSB (La) (ENA) Antibody, IgG: <0.2 AI (ref 0.0–0.9)

## 2018-11-18 LAB — RETICULOCYTES
Immature Retic Fract: 22.4 % — ABNORMAL HIGH (ref 2.3–15.9)
RBC.: 3.49 MIL/uL — ABNORMAL LOW (ref 3.87–5.11)
Retic Count, Absolute: 54.4 10*3/uL (ref 19.0–186.0)
Retic Ct Pct: 1.6 % (ref 0.4–3.1)

## 2018-11-18 LAB — CBC WITH DIFFERENTIAL/PLATELET
ABS IMMATURE GRANULOCYTES: 0.09 10*3/uL — AB (ref 0.00–0.07)
Basophils Absolute: 0.1 10*3/uL (ref 0.0–0.1)
Basophils Relative: 1 %
Eosinophils Absolute: 0.1 10*3/uL (ref 0.0–0.5)
Eosinophils Relative: 1 %
HCT: 32.9 % — ABNORMAL LOW (ref 36.0–46.0)
Hemoglobin: 10 g/dL — ABNORMAL LOW (ref 12.0–15.0)
Immature Granulocytes: 1 %
Lymphocytes Relative: 14 %
Lymphs Abs: 1.3 10*3/uL (ref 0.7–4.0)
MCH: 28.7 pg (ref 26.0–34.0)
MCHC: 30.4 g/dL (ref 30.0–36.0)
MCV: 94.3 fL (ref 80.0–100.0)
Monocytes Absolute: 0.7 10*3/uL (ref 0.1–1.0)
Monocytes Relative: 7 %
Neutro Abs: 7.6 10*3/uL (ref 1.7–7.7)
Neutrophils Relative %: 76 %
Platelets: 325 10*3/uL (ref 150–400)
RBC: 3.49 MIL/uL — ABNORMAL LOW (ref 3.87–5.11)
RDW: 13.4 % (ref 11.5–15.5)
WBC: 9.9 10*3/uL (ref 4.0–10.5)
nRBC: 0 % (ref 0.0–0.2)

## 2018-11-18 LAB — GLOMERULAR BASEMENT MEMBRANE ANTIBODIES: GBM Ab: 3 units (ref 0–20)

## 2018-11-18 LAB — IRON AND TIBC
Iron: 32 ug/dL (ref 28–170)
Saturation Ratios: 9 % — ABNORMAL LOW (ref 10.4–31.8)
TIBC: 337 ug/dL (ref 250–450)
UIBC: 305 ug/dL

## 2018-11-18 LAB — ANTIPHOSPHOLIPID SYNDROME EVAL, BLD
Anticardiolipin IgA: <9 APL U/mL (ref 0–11)
Anticardiolipin IgG: <9 GPL U/mL (ref 0–14)
Anticardiolipin IgM: <9 MPL U/mL (ref 0–12)
DRVVT: 41.9 s (ref 0.0–47.0)
PTT Lupus Anticoagulant: 46.9 s (ref 0.0–51.9)
Phosphatydalserine, IgA: 2 APS IgA (ref 0–20)
Phosphatydalserine, IgG: 4 GPS IgG (ref 0–11)
Phosphatydalserine, IgM: 15 MPS IgM (ref 0–25)

## 2018-11-18 LAB — FOLATE: Folate: 12.1 ng/mL (ref >5.9)

## 2018-11-18 LAB — ANTI-SCLERODERMA ANTIBODY

## 2018-11-18 LAB — ANTI-DNA ANTIBODY, DOUBLE-STRANDED: ds DNA Ab: 1 IU/mL (ref 0–9)

## 2018-11-18 LAB — MPO/PR-3 (ANCA) ANTIBODIES
ANCA Proteinase 3: <3.5 U/mL (ref 0.0–3.5)
Myeloperoxidase Abs: <9 U/mL (ref 0.0–9.0)

## 2018-11-18 LAB — SJOGRENS SYNDROME-A EXTRACTABLE NUCLEAR ANTIBODY: SSA (Ro) (ENA) Antibody, IgG: <0.2 AI (ref 0.0–0.9)

## 2018-11-18 LAB — PHOSPHORUS: Phosphorus: 2.7 mg/dL (ref 2.5–4.6)

## 2018-11-18 LAB — CRYOGLOBULIN

## 2018-11-18 LAB — MAGNESIUM: Magnesium: 2.2 mg/dL (ref 1.7–2.4)

## 2018-11-18 LAB — VITAMIN B12: Vitamin B-12: 436 pg/mL (ref 180–914)

## 2018-11-18 LAB — FERRITIN: FERRITIN: 78 ng/mL (ref 11–307)

## 2018-11-18 LAB — GLUCOSE, CAPILLARY: Glucose-Capillary: 110 mg/dL — ABNORMAL HIGH (ref 70–99)

## 2018-11-18 MED ORDER — POTASSIUM CHLORIDE CRYS ER 20 MEQ PO TBCR
40.0000 meq | EXTENDED_RELEASE_TABLET | Freq: Two times a day (BID) | ORAL | Status: AC
  Administered 2018-11-18: 40 meq via ORAL
  Administered 2018-11-18: 20 meq via ORAL
  Filled 2018-11-18 (×2): qty 2

## 2018-11-18 MED ORDER — ALTEPLASE 2 MG IJ SOLR
2.0000 mg | Freq: Once | INTRAMUSCULAR | Status: DC
  Filled 2018-11-18: qty 2

## 2018-11-18 NOTE — Progress Notes (Addendum)
Went to draw morning labs, DBIV from port and port will not draw back blood, very positional. Had pt sit up, cough and turn to the right with nbr. Pt refuses lab to stick her, for her port to be re-accessed and for another IV RN to attempt to draw labs. She is concerned about cathflo being instilled d/t having surgery tomorrow, pt was educated that cathflo is for de-clotting the port will not affect her having surgery. Pt agreed for this RN to order cathflo. Randy,RN was informed that pt wants to talk to MD about her concerns. Lab also aware of delay.  Lerry Liner, RN,VAST

## 2018-11-18 NOTE — Progress Notes (Signed)
PROGRESS NOTE    [name]  IRJ:[id number] DOB: [birth year] DOA: 11/07/2018 PCP: Su Grand, MD   Brief Narrative:  [name] is a 48 y.o. 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   She was first evaluated for blisters which developed into ulcerations of both hands by her PCP 08/2018.  At that time she was treated with one-time dose of Rocephin in the office followed by Keflex.  Due to subjective fevers at that time she was evaluated in the ED and discharged on clindamycin x 11days, left upper extremity ultrasound at that time showed associated superficial occlusive thrombus of left basilic vein with no DVT. ED evaluation at that time also included CTA negative for PE  Since then ambulatory referral to Rheumatology was placed by her primary oncologist. She has had persistent swelling in both hands without any new ulcerations until a few weeks ago with this current presentation.  A week ago noticed right middle finger blisters in middle of finger. It rsolved on its own and she covered with neosporin and dressed for a few days then it turned black with some purulent drainage. Then started noticing redness, numbness x 2 days. Pain extending in to hand during that time as  well.  E visit --who advised being seen by provider face to face to rule out systemic infection. Denies fevers, chills. Chronic nausea. No vomiting, abd pain, no history of trauma/injury to hand. No other ulcers. No joint pain or aches. No dysuria, no diarrhea/constipation  Interim History:  During the hospitalization she developed Hemopotysis. She continues to have episodes of small-volume hemoptysis while coughing.  Reports some increased shortness of breath.  No fevers or chills.  Also shows new small lesions on left wrist and left forearm and right forearm..  **ID, Pulmonary and Hand Surgery were consulted and workup still pending. She was attempted to undergo Bronchoscopy on 2/14 at 2:00 pm but per my Conversation with Dr. ELoanne Drillingwas unable to due to tolerance.   Flu PCR is Negative x2 but ID recommending treatment dosing. ID recommended General Surgery to biopsy her skin ulcer on her arm and current feeling is that it is 2/2 to a Vasculitic Process. She spiked a temperature overnight on 2/12-2/12 and so Blood Cx were ordered but never drawn so they were ordered again.   Still feels bad and and became febrile and was coughing and feeling more congested. CXR shows worsened LUL consolidation so Abx were broadened to IV Cefepime and IV Metronidazole. Still not feeling well so was placed on Stress Dose Steroids now with IV Solucortef 50 mg q8h and now weaned to q12h. Pulmonary evaluated and feel her LUL Density could be focal alveolhar hemorrhage with possible vasculitis vs. Pneumonic process are are planing a Bronchosopy under propofol sedation in the OR to r/o Endobronchial lesion vs. BAL alveolar hemorrhage and this is going to  be set up this week. Pulmonary recommending continuing IV Cefepime and Flagyl and have repeated autoimmune labs and are following and are going to do a Bronchoscopy in AM.   Assessment & Plan:   Principal Problem:   Cellulitis of right middle finger Active Problems:    Hodgkin lymphoma (HCC)   Breast cancer (HCC)   Thyroid cancer (HCC)   Adrenal insufficiency (HCC)   Hypertension   Hyperlipidemia   Edema   Right heart failure (HCC)   Nephrolithiasis   Oxygen dependent   Migraine   Hemoptysis   SOB (shortness of breath)   FUO (fever of unknown origin)   HCAP (healthcare-associated pneumonia)  Right third finger ulceration, with Cellulitis, improving  -X-ray imaging shows involvement of subcutaneous tissue with no obvious involvement with bone.   -Dr. Amedeo Plenty with surgery following no signs of abscess/compartment syndrome or lymphangitis.   -Given she had no sepsis physiology and no obvious worsening erythema she was transitioned to doxycycline on 2/10 but now has new lesions on left wrist and forearm and right forearm (slight abrasions with some surrounding erythema).   -Discontinued Doxycycline and started Clindamycin since she has done well with this in the past (prior presentation at [facility] during November 2019)and closely monitor over next 24 hours to ensure no fevers or worsening clinical status but discussed with ID Dr. Tommy Medal who recommended changing to Cefazolin and obtaining an MRI of the Hand -MRI of Right Hand ordered along with Inflammatory Markers and (ESR was 18 and CRP was 1.3) and along with ANCA titers, Rheumatoid Factor; ID added other Autoimmune studies including Anti-phospholidid Ab and SSA/SSB -Also obtaining UDS  -TTE negative for vegetation, no DVT in R arm, no clot in subclavian.  -Blood cultures ordered because she became febrile at 100.8 but patient refused them -IV Abx changed from Cefazolin now to IV Cefepime and Metronidazole  -WBC also elevated to 14,000 and worsened to 17,100 and now is trending back down 9,900  Left Upper Lobe Pneumonia/Density with concern for Focal Alveolar Hemorrhage with Possible Vascilitis vs. Pnemonic Process -Blood cultures ordered because she became febrile at 100.8 but patient refused them when  she spiked a tempreature but did allow [place] to get a Left Thumb Blood Cx -WBC also elevated to 14,000 and worsened to 17,100 and now is trending back down 9,900 -Further Abx Recommendations per ID; Influenza Negative via PCR but ID feels as if this may be a False positive and ID recommends that if she goes for Bronchoscopy to send a new PCR from BAL for Influenza; Unable to Do Bronchoscopy under normal circumstances and repeat Influenza Panel Negative -Respiratory Virus Panel Negative  -She has been started on prophylatic Influenza Dosing due to concern of Nosocomial Spread as her neighbors in the hospital have the Flu but this was changed to Treatment dosing per ID Recommendations; Today is Day 4/5 of Tamiflu -Will get Strep A Cx -Continue to Monitor and Further Abx treatment course per ID -CXR yesterday AM showed Stable LEFT UPPER LOBE pneumonia.  No new abnormalities. -Broadened Abx as above; Started Flutter Valve, Incentive Spirometry, Guaifenesin  -C/w Budesonide 0.5 mg Neb BID and Breo-Ellipta -PCT is <0.10 x2 -Pulmonary Re-evaluated and they recommend continue empiric cefepime and Flagyl.  She has a left upper lobe density with fever and pitting lesions on the skin and there is concern for a focal alveolar hemorrhage with possible vasculitic process versus pneumonic process -Pulmonary repeated autoimmune labs and have scheduled bronchoscopy in AM with  Propfol to rule out endobronchial lesion versus alveolar hemorrhage and can consider biopsy during the bronc to assess for possible underlying vasculitis  Small volume hemoptysis episodes -Becoming more frequent.  Occurring with cough.   -CT chest negative for any new masses, chronic nodule shown.   -Sending for culture to assess for potential infection given some diminished breath sounds in left bases.  -CXR showed Small airspace opacity on the left that was not seen on chest CT 3 days ago and consistent with an inflammatory process,  presumably pneumonia. -Consulted Pulmonary for further evaluation and recommendations and defer to them for further workup -Pulmonary planning on doing a Bronchoscopy and this was attempted today but patient stated she had problem with sedation and usually requires deep sedation with Propofol but it was attempted and then aborted. Dr. Cordelia Pen partner to follow up this weekend and ? If Bronchosopy is going to be rescheduled next week under Anesthesia -Per my conversation with Dr. Hale Bogus she feels as if the patient's Hempotypsis could be 2/2 to Nose bleeding but ? Related to this LUL Pneumonia vs. Focal Alveolar hemorrhage -Continue to Monitor and Pulmonary to setup Bronchoscopy for AM with Anesthesia in the OR  -Dr. Chase Caller evaluated yesterday and feels that she has a left upper lobe density concerning for focal alveolar hemorrhage with possible vasculitis versus a pneumonic process -We will continue IV antibiotics at this time  Subacute history of blisters developing into ulcers and bilateral hand and arm swelling.   -Ongoing for 3 months.  Is unclear if this is related to some autoimmune process (has history of Raynaud's phenomenon).  -Increased Lasix to 40 mg BID and swelling is improving significantly  -CCP was obtained on 08/2018 at ED vist and was unremarkable.   -HIV and ANA negative here. Will obtain ANCA Titers, Rhuematoid Factor, ESR and CRP and also obtain a UDS; As above; ANCA titers have been negative and repeat dsDNa Ab, GBM Ab, and MPO Abxo Negative. Ro/La/Scleroderma Abx Negative  -So far Autoimmune Studies have been negative but pulmonary will be repeating them.  ESR is now 90 on repeat -RA Latex Turbid was 11.1 and repeat was 18.6 -Unclear family history as patient is adopted, does not have any other systemic symptoms (no joint aches, no joint pain,). -CT chest was negative for any new metastatic lesions or any lymphadenopathy  -Dr. Tommy Medal reached out to Dr. 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   -Had originally Discontinued IV hydrocortisone and transitioned to oral stress dosing 20 mg of hydrocortisone x3 days followed by reinitiation of her home regimen 10 mg in the a.m. 5 mg in p.m. 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   -Status post 14-day treatment Lovenox by oncologist in 08/2018 -Continue to Monitor   Chronic hypoxic respiratory failure, stable on home 3 L nasal cannula.  ? 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   -Followed by [facility] pulmonology.  Continue home Breo Pulmicort BID added here.  Albuterol PRN and scheduled DuoNeb -Pulmonary Folllowing  and planning on doing Bronchoscopy  -Per [facility] Notes Dr. Geraldo Docker:  "She has already had much of the work-up for this (CT chest, PFTs, TTE, RHC--note she does not have PAH based on her RHC with wedge 16 and PA mean 20.) though we have not objectively confirmed it in our own clinic. A low sat was documented in the cancer center once early on (at rest) but she has not made it to a 6MWT and her CPET in 2017 was very normal in terms of oxygenation. She cannot walk aggressively enough for a 6MWT now given her leg weakness. She thinks she can exercise vigorously on a bike and wants to repeat a CPET. This will also provide info about [place] cardiac [place] - CPET on room air" -Patient never presented for CPET -Yesterday AM she was a little Hypoxic but she has had episodes of normal saturation on room air during this admission -We will assess further with an ambulatory home O2 study to assess whether she truly needs oxygen prior to discharge  History of Aortic Stenosis.  -Documented in [facility] Chart. Systolic murmur heard on exam. Last TTE at [facility] on 08/2018 showed no stenosis.  -No vegetation on TTE here, does have tricupsid valve structure/calcification moderate.   Chronic Nausea.   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   -Followed by Dr. Wendee Beavers in [place]  Stage IV Hodgkin's disease -Diagnosed at age 15.  Status post chemotherapy and mantle field radiotherapy  GERD -C/w Dexilant substitution   HLD,stable -C/w Home Rosuvastatin 10 mg po daily   Post-Surgical Hypothyroidism.  -TSH suppressed but not accurate given prior pituitary resection.  -Follow free T4.  -Continue home synthroid at current dose of 112 mcg   History of CHF with  reduced EF. -Last TTE 08/2018 showed normal systolic and diastolic function. BNP here unremarkable.  -Continued home lasix of 20 mg po BID but now increased to 40 mg BID  -Strict I's/O's and Daily Weights; She is +7.141 Liters since admission; Weight's Not done since admission   Normocytic Anemia  -Patient's Hb/Hct went from 12.1/39.1 -> 10.0/32.9 -Checked Anemia Panel and showed iron level of 32, U IBC of 305, TIBC of 337, saturation ratios of 9%, ferritin level of 78, folate level 12.1, and vitamin B12 level 436 -Continue to Monitor for S/Sx for Bleeding -Repeat CBC in AM   Hypokalemia -Patient's potassium this morning was 3.2  -Treat with p.o. potassium chloride 40 mg twice daily x2 doses as well as home dose of  Urocit-K 10 mg daily -Continue to monitor and replete as necessary -Repeat CMP in a.m.  Hypophosphatemia -Patient's phosphorus level is currently 2.7 -Continue to monitor and replete as necessary -Repeat phosphorus level in a.m.  DVT prophylaxis: Lovenox Held due to hemoptysis as patient is going for bronchoscopy in a.m. Code Status: FULL CODE Family Communication: No family present at bedside  Disposition Plan: Remain Inpatient for further workup and evaluation as patient is undergoing bronchoscopy in a.m.  Consultants:   Pulmonary  Infectious Diseases  Hand Surgery   General Surgery   Procedures:   None  Antimicrobials:  Anti-infectives (From admission, onward)   Start     Dose/Rate Route Frequency Ordered Stop   11/17/18 2200  oseltamivir (TAMIFLU) capsule 75 mg     75 mg Oral 2 times daily 11/17/18 1020 11/19/18 2159   11/17/18 1830  ceFEPIme (MAXIPIME) 2 g in sodium chloride 0.9 % 100 mL IVPB     2 g 200 mL/hr over 30 Minutes Intravenous Every 8 hours 11/17/18 1022     11/15/18 1030  ceFEPIme (MAXIPIME) 1 g in sodium chloride 0.9 % 100 mL IVPB  Status:  Discontinued     1 g 200 mL/hr over 30 Minutes Intravenous Every 8 hours 11/15/18 1008 11/17/18  1022   11/15/18 1000  oseltamivir (TAMIFLU) capsule 75 mg  Status:  Discontinued     75 mg Oral 2 times daily 11/15/18 0849 11/17/18 0834   11/15/18 0900  metroNIDAZOLE (FLAGYL) IVPB 500 mg     500 mg 100 mL/hr over 60 Minutes Intravenous Every 8 hours 11/15/18 0849     11/14/18 1000  oseltamivir (TAMIFLU) capsule 75 mg  Status:  Discontinued     75 mg Oral Daily 11/14/18 0833 11/15/18 0849   11/12/18 2200  ceFAZolin (ANCEF) IVPB 1 g/50 mL premix  Status:  Discontinued     1 g 100 mL/hr over 30 Minutes Intravenous Every 8 hours 11/12/18 1841 11/15/18 0849   11/12/18 1930  ceFAZolin (ANCEF) IVPB 1 g/50 mL premix  Status:  Discontinued     1 g 100 mL/hr over 30 Minutes Intravenous Every 8 hours 11/12/18 1838 11/12/18 1841   11/11/18 1400  clindamycin (CLEOCIN) IVPB 600 mg  Status:  Discontinued     600 mg 100 mL/hr over 30 Minutes Intravenous Every 8 hours 11/11/18 1014 11/12/18 1814   11/10/18 1645  doxycycline (VIBRA-TABS) tablet 100 mg  Status:  Discontinued     100 mg Oral Every 12 hours 11/10/18 1641 11/11/18 1014   11/08/18 1000  vancomycin (VANCOCIN) IVPB 1000 mg/200 mL premix  Status:  Discontinued     1,000 mg 200 mL/hr over 60 Minutes Intravenous Every 12 hours 11/07/18 2115 11/09/18 1545   11/07/18 2130  vancomycin (VANCOCIN) IVPB 750 mg/150 ml premix     750 mg 150 mL/hr over 60 Minutes Intravenous  Once 11/07/18 2110 11/07/18 2310   11/07/18 2000  vancomycin (VANCOCIN) IVPB 1000 mg/200 mL premix     1,000 mg 200 mL/hr over 60 Minutes Intravenous  Once 11/07/18 1955 11/07/18 2117     Subjective: Seen and examined at bedside thinks that her hand swelling and arm swelling is much better but still feels very bad and complains of chest tightness.  Still having hemoptysis.  No nausea or vomiting.  Trying to avoid IV narcotics.  That her white blood cell count is down now but concerned about her ESR.  Was upset with phlebotomy this morning as a try  to flush her port with heparin as  labs would not withdraw.  No other concerns or complaints at this time and ready for a bronchoscopy in the a.m.  Objective: Vitals:   11/17/18 2121 11/18/18 0414 11/18/18 0930 11/18/18 0934  BP: 131/88 126/85    Pulse: 93 80    Resp: 20 14    Temp: 98.7 F (37.1 C) 98 F (36.7 C)    TempSrc: Oral Oral    SpO2: 100% 100% 99% 98%  Weight:      Height:        Intake/Output Summary (Last 24 hours) at 11/18/2018 1922 Last data filed at 11/18/2018 1420 Gross per 24 hour  Intake 720 ml  Output -  Net 720 ml   Filed Weights   11/07/18 1635  Weight: 80 kg   Examination: Physical Exam:  Constitutional: Well-nourished, well-developed overweight Caucasian female currently no acute distress laying in bed appears slightly uncomfortable but is calm Eyes: Lids and conjunctive are normal.  Sclera anicteric ENMT: External ears and nose appear normal.  Grossly normal hearing Neck: Appears supple no JVD Respiratory: Diminished auscultation bilaterally with no appreciable wheezing but does have coarse breath sounds more so on the left upper lobe compared to the right.  She is not tachypneic but she is wearing supplemental oxygen via nasal cannula Cardiovascular: Rate and rhythm with no appreciable murmurs, rubs, gallops Abdomen: Soft, nontender, distended secondary body habitus.  Bowel sounds present GU: Deferred Musculoskeletal: No contractures or cyanosis.  No deformities noted Skin: Skin is warm dry no appreciable rashes or lesions on face evaluation and upper extremity edema is improved significantly.  She does have some mild blistering and swollen middle finger with some ulceration but this is improving slightly as well  Neurologic: Cranial nerves II through XII gross intact no appreciable focal deficits Psychiatric: Depressed appearing mood and flat affect.  Intact judgment insight.  She is awake alert and oriented x3  Data Reviewed: I have personally reviewed following labs and imaging  studies  CBC: Recent Labs  Lab 11/14/18 0403 11/15/18 0500 11/16/18 1029 11/17/18 0415 11/18/18 0902  WBC 17.1* 12.7* 9.5 8.2 9.9  NEUTROABS 12.3* 9.4* 6.4 6.8 7.6  HGB 11.1* 10.2* 10.3* 10.6* 10.0*  HCT 34.9* 33.4* 34.4* 34.1* 32.9*  MCV 92.3 94.1 94.8 93.4 94.3  PLT 250 246 281 300 396   Basic Metabolic Panel: Recent Labs  Lab 11/14/18 0403 11/15/18 0500 11/16/18 1029 11/17/18 0415 11/18/18 0902  NA 136 136 139 138 139  K 3.1* 3.5 3.4* 3.7 3.2*  CL 103 103 107 105 106  CO2 _0 GLUCOSE 121* 115* 102* 193* 130*  BUN _1 CREATININE 0.91 0.99 0.90 0.85 0.94  CALCIUM 8.5* 8.2* 8.6* 8.3* 8.8*  MG 2.0 2.3 2.2 2.3 2.2  PHOS 2.1* 2.4* 3.3 2.7 2.7   GFR: Estimated Creatinine Clearance: 77.3 mL/min (by C-G formula based on SCr of 0.94 mg/dL). Liver Function Tests: Recent Labs  Lab 11/14/18 0403 11/15/18 0500 11/16/18 1029 11/17/18 0415 11/18/18 0902  AST _2 32  ALT 30 25 33 35 37  ALKPHOS 50 52 59 68 63  BILITOT 0.8 0.4 0.4 0.5 <0.1*  PROT 6.7 6.6 7.0 6.6 6.6  ALBUMIN 3.2* 2.9* 3.0* 2.9* 2.9*   No results for input(s): LIPASE, AMYLASE in the last 168 hours. No results for input(s): AMMONIA in the last 168 hours. Coagulation Profile: No results for input(s): INR,  PROTIME in the last 168 hours. Cardiac Enzymes: No results for input(s): CKTOTAL, CKMB, CKMBINDEX, TROPONINI in the last 168 hours. BNP (last 3 results) No results for input(s): PROBNP in the last 8760 hours. HbA1C: No results for input(s): HGBA1C in the last 72 hours. CBG: Recent Labs  Lab 11/12/18 1216 11/13/18 1116 11/18/18 1122  GLUCAP 94 112* 110*   Lipid Profile: No results for input(s): CHOL, HDL, LDLCALC, TRIG, CHOLHDL, LDLDIRECT in the last 72 hours. Thyroid Function Tests: No results for input(s): TSH, T4TOTAL, FREET4, T3FREE, THYROIDAB in the last 72 hours. Anemia Panel: Recent Labs    11/18/18 0902  VITAMINB12 436  FOLATE 12.1  FERRITIN 78   TIBC 337  IRON 32  RETICCTPCT 1.6   Sepsis Labs: Recent Labs  Lab 11/17/18 0415 11/18/18 0902  PROCALCITON <0.10 <0.10    Recent Results (from the past 240 hour(s))  Expectorated sputum assessment w rflx to resp cult     Status: None   Collection Time: 11/11/18  3:21 PM  Result Value Ref Range Status   Specimen Description EXPECTORATED SPUTUM  Final   Special Requests NONE  Final   Sputum evaluation   Final    Sputum specimen not acceptable for testing.  Please recollect.   RESULT CALLED TO, READ BACK BY AND VERIFIED WITH: R RAKHIMOVA RN 2255 11/11/18 A BROWNING Performed at [facility], [place] [address], [place], [place] [address]    Report Status 11/11/2018 FINAL  Final  Expectorated sputum assessment w rflx to resp cult     Status: None   Collection Time: 11/12/18  3:31 AM  Result Value Ref Range Status   Specimen Description EXPECTORATED SPUTUM  Final   Special Requests NONE  Final   Sputum evaluation   Final    Sputum specimen not acceptable for testing.  Please recollect.   RESULT CALLED TO, READ BACK BY AND VERIFIED WITH: RALCHIMOVA RN 11/12/18 0605 JDW Performed at [facility] [address] [place], [place] [address]    Report Status 11/12/2018 FINAL  Final  Culture, blood (routine x 2)     Status: None (Preliminary result)   Collection Time: 11/14/18  9:03 AM  Result Value Ref Range Status   Specimen Description BLOOD LEFT THUMB  Final   Special Requests   Final    BOTTLES DRAWN AEROBIC ONLY Blood Culture adequate volume   Culture   Final    NO GROWTH 4 DAYS Performed at [facility], [place] [address] [place], [place] [address]    Report Status PENDING  Incomplete  Respiratory Panel by PCR     Status: None   Collection Time: 11/15/18  8:20 AM  Result Value Ref Range Status   Adenovirus NOT DETECTED NOT DETECTED Final   Coronavirus 229E NOT DETECTED NOT DETECTED Final    Comment: (NOTE) The Coronavirus on the Respiratory Panel, DOES NOT  test for the novel  Coronavirus (2019 nCoV)    Coronavirus HKU1 NOT DETECTED NOT DETECTED Final   Coronavirus NL63 NOT DETECTED NOT DETECTED Final   Coronavirus OC43 NOT DETECTED NOT DETECTED Final   Metapneumovirus NOT DETECTED NOT DETECTED Final   Rhinovirus / Enterovirus NOT DETECTED NOT DETECTED Final   Influenza A NOT DETECTED NOT DETECTED Final   Influenza B NOT DETECTED NOT DETECTED Final   Parainfluenza Virus 1 NOT DETECTED NOT DETECTED Final   Parainfluenza Virus 2 NOT DETECTED NOT DETECTED Final   Parainfluenza Virus 3 NOT DETECTED  NOT DETECTED Final   Parainfluenza Virus 4 NOT DETECTED NOT DETECTED Final   Respiratory Syncytial Virus NOT DETECTED NOT DETECTED Final   Bordetella pertussis NOT DETECTED NOT DETECTED Final   Chlamydophila pneumoniae NOT DETECTED NOT DETECTED Final   Mycoplasma pneumoniae NOT DETECTED NOT DETECTED Final    Comment: Performed at [facility], [place] [address] [place], [place] [address]    Radiology Studies: Dg Chest Port 1 View  Result Date: 11/17/2018 CLINICAL DATA:  Shortness of breath.  Pneumonia. EXAM: PORTABLE CHEST 1 VIEW COMPARISON:  Multiple chest x-rays since November 13, 2018. FINDINGS: The infiltrate in the lateral left mid lung is similar since yesterday but increased since November 13, 2018. No pneumothorax. Stable right Port-A-Cath. The cardiomediastinal silhouette is unchanged. IMPRESSION: Stable infiltrate in the periphery of the left mid lung, similar in the interval. In the appropriate clinical setting, this would be consistent with pneumonia. Electronically Signed   By: Dorise Bullion III M.D   On: 11/17/2018 08:17    Scheduled Meds: . alteplase  2 mg Intracatheter Once  . budesonide  0.5 mg Inhalation BID  . clonazePAM  1 mg Oral TID  . escitalopram  20 mg Oral Daily  . estradiol  2 mg Oral Daily  . fluticasone furoate-vilanterol  1 puff Inhalation Daily  . furosemide  40 mg Oral BID  . guaiFENesin  1,200 mg Oral BID   . hydrocortisone sod succinate (SOLU-CORTEF) inj  50 mg Intravenous Q12H  . ipratropium-albuterol  3 mL Nebulization BID  . levothyroxine  112 mcg Oral QAC breakfast  . metoprolol succinate  37.5 mg Oral Daily  . oseltamivir  75 mg Oral BID  . potassium chloride  40 mEq Oral BID  . potassium citrate  10 mEq Oral Daily  . rosuvastatin  10 mg Oral Daily  . sodium chloride flush  10-40 mL Intracatheter Q12H  . topiramate  150 mg Oral QHS   Continuous Infusions: . sodium chloride 10 mL/hr at 11/15/18 0553  . ceFEPime (MAXIPIME) IV 2 g (11/18/18 1812)  . small volume/piggyback builder 100 mL/hr at 11/18/18 1901  . metronidazole 500 mg (11/18/18 1652)    LOS: 11 days   Kerney Elbe, DO [facility] PAGER is on [place]  If 7PM-7AM, please contact night-coverage [email or web address] Password [facility] 11/18/2018, 7:22 PM

## 2018-11-18 NOTE — Progress Notes (Signed)
Pulmonary Progress Note  Patient continues to have hemoptysis. Steroids weaned down. Patient reports increased hemoptysis compared to yesterday. Bronchoscopy scheduled for 10:30 AM on 2/19.  Rodman Pickle, M.D. [facility] Pulmonary/Critical Care Medicine Pager: [phone] After hours pager: [phone]

## 2018-11-18 NOTE — Plan of Care (Signed)

## 2018-11-18 NOTE — Anesthesia Preprocedure Evaluation (Addendum)
Anesthesia Evaluation  Patient identified by MRN, date of birth, ID band Patient awake  General Assessment Comment:Case reviewed with patient, husband, and surgeon in detail. CG  Reviewed: Allergy & Precautions, NPO status , Patient's Chart, lab work & pertinent test results  Airway Mallampati: II  TM Distance: >3 FB     Dental   Pulmonary pneumonia,    breath sounds clear to auscultation       Cardiovascular hypertension, +CHF   Rhythm:Regular Rate:Normal     Neuro/Psych    GI/Hepatic GERD  ,  Endo/Other    Renal/GU Renal disease     Musculoskeletal   Abdominal   Peds  Hematology   Anesthesia Other Findings   Reproductive/Obstetrics                           Anesthesia Physical Anesthesia Plan  ASA: III  Anesthesia Plan: General   Post-op Pain Management:    Induction: Intravenous  PONV Risk Score and Plan: 3 and Ondansetron, Dexamethasone and Midazolam  Airway Management Planned: Oral ETT  Additional Equipment:   Intra-op Plan:   Post-operative Plan: Possible Post-op intubation/ventilation  Informed Consent: I have reviewed the patients History and Physical, chart, labs and discussed the procedure including the risks, benefits and alternatives for the proposed anesthesia with the patient or authorized representative who has indicated his/her understanding and acceptance.     Dental advisory given  Plan Discussed with: CRNA, Anesthesiologist and Surgeon  Anesthesia Plan Comments:        Anesthesia Quick Evaluation

## 2018-11-19 ENCOUNTER — Encounter (HOSPITAL_COMMUNITY)
Admission: EM | Disposition: A | Discharge status: Home / Self Care | Payer: Self-pay | Source: Home / Self Care | Attending: Internal Medicine

## 2018-11-19 ENCOUNTER — Inpatient Hospital Stay (HOSPITAL_COMMUNITY): Payer: BLUE CROSS/BLUE SHIELD | Admitting: Anesthesiology

## 2018-11-19 ENCOUNTER — Encounter (HOSPITAL_COMMUNITY): Payer: Self-pay | Admitting: *Deleted

## 2018-11-19 HISTORY — PX: VIDEO BRONCHOSCOPY WITH ENDOBRONCHIAL ULTRASOUND: SHX6177

## 2018-11-19 LAB — COMPREHENSIVE METABOLIC PANEL
ALT: 32 U/L (ref 0–44)
AST: 26 U/L (ref 15–41)
Albumin: 2.8 g/dL — ABNORMAL LOW (ref 3.5–5.0)
Alkaline Phosphatase: 58 U/L (ref 38–126)
Anion gap: 10 (ref 5–15)
BUN: 19 mg/dL (ref 6–20)
CHLORIDE: 104 mmol/L (ref 98–111)
CO2: 25 mmol/L (ref 22–32)
Calcium: 8.7 mg/dL — ABNORMAL LOW (ref 8.9–10.3)
Creatinine, Ser: 0.91 mg/dL (ref 0.44–1.00)
GFR calc Af Amer: >60 mL/min (ref >60)
GFR calc non Af Amer: >60 mL/min (ref >60)
Glucose, Bld: 120 mg/dL — ABNORMAL HIGH (ref 70–99)
Potassium: 3.1 mmol/L — ABNORMAL LOW (ref 3.5–5.1)
Sodium: 139 mmol/L (ref 135–145)
Total Bilirubin: 0.1 mg/dL — ABNORMAL LOW (ref 0.3–1.2)
Total Protein: 6.2 g/dL — ABNORMAL LOW (ref 6.5–8.1)

## 2018-11-19 LAB — CULTURE, BLOOD (ROUTINE X 2)
Culture: NO GROWTH
Special Requests: ADEQUATE

## 2018-11-19 LAB — CBC WITH DIFFERENTIAL/PLATELET
Abs Immature Granulocytes: 0.11 10*3/uL — ABNORMAL HIGH (ref 0.00–0.07)
Basophils Absolute: 0.1 10*3/uL (ref 0.0–0.1)
Basophils Relative: 1 %
EOS ABS: 0.1 10*3/uL (ref 0.0–0.5)
Eosinophils Relative: 1 %
HCT: 33 % — ABNORMAL LOW (ref 36.0–46.0)
Hemoglobin: 9.9 g/dL — ABNORMAL LOW (ref 12.0–15.0)
Immature Granulocytes: 1 %
Lymphocytes Relative: 21 %
Lymphs Abs: 2.1 10*3/uL (ref 0.7–4.0)
MCH: 28.2 pg (ref 26.0–34.0)
MCHC: 30 g/dL (ref 30.0–36.0)
MCV: 94 fL (ref 80.0–100.0)
Monocytes Absolute: 0.9 10*3/uL (ref 0.1–1.0)
Monocytes Relative: 9 %
NEUTROS PCT: 67 %
Neutro Abs: 7 10*3/uL (ref 1.7–7.7)
PLATELETS: 357 10*3/uL (ref 150–400)
RBC: 3.51 MIL/uL — AB (ref 3.87–5.11)
RDW: 13.4 % (ref 11.5–15.5)
WBC: 10.3 10*3/uL (ref 4.0–10.5)
nRBC: 0 % (ref 0.0–0.2)

## 2018-11-19 LAB — BODY FLUID CELL COUNT WITH DIFFERENTIAL
Eos, Fluid: 9 %
Lymphs, Fluid: 33 %
Monocyte-Macrophage-Serous Fluid: 38 % — ABNORMAL LOW (ref 50–90)
Neutrophil Count, Fluid: 20 % (ref 0–25)
Total Nucleated Cell Count, Fluid: 145 cu mm (ref 0–1000)

## 2018-11-19 LAB — MAGNESIUM: Magnesium: 2.3 mg/dL (ref 1.7–2.4)

## 2018-11-19 LAB — PHOSPHORUS: Phosphorus: 4.2 mg/dL (ref 2.5–4.6)

## 2018-11-19 SURGERY — BRONCHOSCOPY, WITH EBUS
Anesthesia: General

## 2018-11-19 MED ORDER — MIDAZOLAM HCL 2 MG/2ML IJ SOLN
INTRAMUSCULAR | Status: AC
  Filled 2018-11-19: qty 2

## 2018-11-19 MED ORDER — PROPOFOL 500 MG/50ML IV EMUL
INTRAVENOUS | Status: DC | PRN
  Administered 2018-11-19: 250 ug/kg/min via INTRAVENOUS

## 2018-11-19 MED ORDER — ONDANSETRON HCL 4 MG/2ML IJ SOLN
INTRAMUSCULAR | Status: DC | PRN
  Administered 2018-11-19: 4 mg via INTRAVENOUS

## 2018-11-19 MED ORDER — MIDAZOLAM HCL 5 MG/5ML IJ SOLN
INTRAMUSCULAR | Status: DC | PRN
  Administered 2018-11-19: 2 mg via INTRAVENOUS

## 2018-11-19 MED ORDER — FENTANYL CITRATE (PF) 100 MCG/2ML IJ SOLN
INTRAMUSCULAR | Status: DC | PRN
  Administered 2018-11-19: 100 ug via INTRAVENOUS

## 2018-11-19 MED ORDER — ROCURONIUM BROMIDE 50 MG/5ML IV SOSY
PREFILLED_SYRINGE | INTRAVENOUS | Status: DC | PRN
  Administered 2018-11-19: 40 mg via INTRAVENOUS

## 2018-11-19 MED ORDER — FENTANYL CITRATE (PF) 250 MCG/5ML IJ SOLN
INTRAMUSCULAR | Status: AC
  Filled 2018-11-19: qty 5

## 2018-11-19 MED ORDER — FENTANYL CITRATE (PF) 100 MCG/2ML IJ SOLN: 25.0000 ug | INTRAMUSCULAR | Status: DC | PRN

## 2018-11-19 MED ORDER — DEXAMETHASONE SODIUM PHOSPHATE 10 MG/ML IJ SOLN
INTRAMUSCULAR | Status: DC | PRN
  Administered 2018-11-19: 10 mg via INTRAVENOUS

## 2018-11-19 MED ORDER — DEXMEDETOMIDINE HCL 200 MCG/2ML IV SOLN
INTRAVENOUS | Status: DC | PRN
  Administered 2018-11-19: 8 ug via INTRAVENOUS

## 2018-11-19 MED ORDER — SCOPOLAMINE 1 MG/3DAYS TD PT72
MEDICATED_PATCH | TRANSDERMAL | Status: AC
  Filled 2018-11-19: qty 1

## 2018-11-19 MED ORDER — SCOPOLAMINE 1 MG/3DAYS TD PT72
MEDICATED_PATCH | TRANSDERMAL | Status: DC | PRN
  Administered 2018-11-19: 1 via TRANSDERMAL

## 2018-11-19 MED ORDER — SUGAMMADEX SODIUM 200 MG/2ML IV SOLN
INTRAVENOUS | Status: DC | PRN
  Administered 2018-11-19: 200 mg via INTRAVENOUS

## 2018-11-19 MED ORDER — LACTATED RINGERS IV SOLN
INTRAVENOUS | Status: DC
  Administered 2018-11-19: 11:00:00 via INTRAVENOUS

## 2018-11-19 MED ORDER — PROPOFOL 10 MG/ML IV BOLUS
INTRAVENOUS | Status: AC
  Filled 2018-11-19: qty 20

## 2018-11-19 MED ORDER — PROPOFOL 10 MG/ML IV BOLUS
INTRAVENOUS | Status: DC | PRN
  Administered 2018-11-19: 150 mg via INTRAVENOUS

## 2018-11-19 MED ORDER — FENTANYL CITRATE (PF) 100 MCG/2ML IJ SOLN
25.0000 ug | INTRAMUSCULAR | Status: AC | PRN
  Administered 2018-11-19 (×3): 25 ug via INTRAVENOUS
  Filled 2018-11-19 (×3): qty 2

## 2018-11-19 MED ORDER — DIPHENHYDRAMINE HCL 50 MG/ML IJ SOLN
INTRAMUSCULAR | Status: AC
  Filled 2018-11-19: qty 1

## 2018-11-19 MED ORDER — 0.9 % SODIUM CHLORIDE (POUR BTL) OPTIME
TOPICAL | Status: DC | PRN
  Administered 2018-11-19: 1000 mL

## 2018-11-19 MED ORDER — DIPHENHYDRAMINE HCL 50 MG/ML IJ SOLN
INTRAMUSCULAR | Status: DC | PRN
  Administered 2018-11-19: 50 mg via INTRAVENOUS

## 2018-11-19 MED ORDER — FENTANYL CITRATE (PF) 100 MCG/2ML IJ SOLN
25.0000 ug | INTRAMUSCULAR | Status: DC | PRN
  Administered 2018-11-19: 25 ug via INTRAVENOUS

## 2018-11-19 MED ORDER — FENTANYL CITRATE (PF) 100 MCG/2ML IJ SOLN
INTRAMUSCULAR | Status: AC
  Administered 2018-11-19: 25 ug via INTRAVENOUS
  Filled 2018-11-19: qty 2

## 2018-11-19 SURGICAL SUPPLY — 42 items
ADAPTER VALVE BIOPSY EBUS (MISCELLANEOUS) IMPLANT
ADPTR VALVE BIOPSY EBUS (MISCELLANEOUS)
BALLN FOR EBUS SCOPE (BALLOONS) ×3
BALLOON FOR EBUS SCOPE (BALLOONS) ×1 IMPLANT
BALN ESCP STRL LXBF-UC160F (BALLOONS) ×1
BRUSH CYTOL CELLEBRITY 1.5X140 (MISCELLANEOUS) IMPLANT
CANISTER SUCT 3000ML PPV (MISCELLANEOUS) ×3 IMPLANT
CONT SPEC 4OZ CLIKSEAL STRL BL (MISCELLANEOUS) ×3 IMPLANT
COVER BACK TABLE 60X90IN (DRAPES) ×3 IMPLANT
FORCEPS BIOP RJ4 1.8 (CUTTING FORCEPS) IMPLANT
GAUZE SPONGE 4X4 12PLY STRL (GAUZE/BANDAGES/DRESSINGS) ×3 IMPLANT
GLOVE BIO SURGEON STRL SZ 6.5 (GLOVE) ×2 IMPLANT
GLOVE BIO SURGEONS STRL SZ 6.5 (GLOVE) ×1
GOWN STRL REUS W/ TWL LRG LVL3 (GOWN DISPOSABLE) ×1 IMPLANT
GOWN STRL REUS W/TWL LRG LVL3 (GOWN DISPOSABLE) ×3
KIT CLEAN ENDO COMPLIANCE (KITS) ×6 IMPLANT
KIT TURNOVER KIT B (KITS) ×3 IMPLANT
MARKER SKIN DUAL TIP RULER LAB (MISCELLANEOUS) ×3 IMPLANT
NDL ASPIRATION VIZISHOT 21G (NEEDLE) ×1 IMPLANT
NEEDLE ASPIRATION VIZISHOT 19G (NEEDLE) IMPLANT
NEEDLE ASPIRATION VIZISHOT 21G (NEEDLE) ×3 IMPLANT
NS IRRIG 1000ML POUR BTL (IV SOLUTION) ×3 IMPLANT
OIL SILICONE PENTAX (PARTS (SERVICE/REPAIRS)) ×3 IMPLANT
PAD ARMBOARD 7.5X6 YLW CONV (MISCELLANEOUS) ×6 IMPLANT
STOPCOCK 4 WAY LG BORE MALE ST (IV SETS) ×2 IMPLANT
SYR 20CC LL (SYRINGE) ×6 IMPLANT
SYR 20ML ECCENTRIC (SYRINGE) ×6 IMPLANT
SYR 50ML SLIP (SYRINGE) IMPLANT
SYR 5ML LL (SYRINGE) ×2 IMPLANT
SYR 5ML LUER SLIP (SYRINGE) ×3 IMPLANT
SYRINGE 60CC LL (MISCELLANEOUS) ×2 IMPLANT
TOWEL OR 17X24 6PK STRL BLUE (TOWEL DISPOSABLE) ×3 IMPLANT
TRAP SPECIMEN MUCOUS 40CC (MISCELLANEOUS) ×2 IMPLANT
TUBE CONNECTING 20'X1/4 (TUBING) ×2
TUBE CONNECTING 20X1/4 (TUBING) ×4 IMPLANT
TUBING CIL FLEX 10 FLL-RA (TUBING) ×2 IMPLANT
TUBING EXTENTION W/L.L. (IV SETS) ×2 IMPLANT
UNDERPAD 30X30 (UNDERPADS AND DIAPERS) ×3 IMPLANT
VALVE BIOPSY  SINGLE USE (MISCELLANEOUS) ×4
VALVE BIOPSY SINGLE USE (MISCELLANEOUS) ×1 IMPLANT
VALVE SUCTION BRONCHIO DISP (MISCELLANEOUS) ×3 IMPLANT
WATER STERILE IRR 1000ML POUR (IV SOLUTION) ×3 IMPLANT

## 2018-11-19 NOTE — Plan of Care (Signed)
  Problem: Pain Managment: Goal: General experience of comfort will improve Outcome: Progressing   Problem: Safety: Goal: Ability to remain free from injury will improve Outcome: Progressing

## 2018-11-19 NOTE — Transfer of Care (Signed)
Immediate Anesthesia Transfer of Care Note  Patient: [name]  Procedure(s) Performed: VIDEO BRONCHOSCOPY WITH ENDOBRONCHIAL ULTRASOUND (N/A )  Patient Location: PACU  Anesthesia Type:General  Level of Consciousness: awake and patient cooperative  Airway & Oxygen Therapy: Patient Spontanous Breathing and Patient connected to nasal cannula oxygen  Post-op Assessment: Report given to RN and Post -op Vital signs reviewed and stable  Post vital signs: Reviewed and stable  Last Vitals:  Vitals Value Taken Time  BP 94/79 11/19/2018 11:57 AM  Temp 36.5 C 11/19/2018 11:57 AM  Pulse 87 11/19/2018 11:59 AM  Resp 19 11/19/2018 11:59 AM  SpO2 95 % 11/19/2018 11:59 AM  Vitals shown include unvalidated device data.  Last Pain:  Vitals:   11/19/18 0430  TempSrc: Oral  PainSc:       Patients Stated Pain Goal: 2 (62/03/55 9741)  Complications: No apparent anesthesia complications

## 2018-11-19 NOTE — Plan of Care (Signed)

## 2018-11-19 NOTE — Anesthesia Procedure Notes (Signed)
Procedure Name: Intubation Date/Time: 11/19/2018 11:02 AM Performed by: Lance Coon, CRNA Pre-anesthesia Checklist: Patient identified, Emergency Drugs available, Suction available, Patient being monitored and Timeout performed Patient Re-evaluated:Patient Re-evaluated prior to induction Oxygen Delivery Method: Circle system utilized Preoxygenation: Pre-oxygenation with 100% oxygen Induction Type: IV induction Ventilation: Mask ventilation without difficulty Laryngoscope Size: Miller and 3 Grade View: Grade I Tube type: Oral Tube size: 8.5 mm Number of attempts: 1 Airway Equipment and Method: Stylet Placement Confirmation: ETT inserted through vocal cords under direct vision,  positive ETCO2 and breath sounds checked- equal and bilateral Secured at: 20 cm Tube secured with: Tape Dental Injury: Teeth and Oropharynx as per pre-operative assessment

## 2018-11-19 NOTE — Progress Notes (Signed)
PROGRESS NOTE  [name] TKZ:[id number] DOB: [birth year] DOA: 11/07/2018 PCP: Su Grand, MD   LOS: 12 days   Brief Narrative / Interim history: [name] is a 48 y.o. 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  She was first evaluated for blisters which developed into ulcerations of both hands by her PCP 08/2018. At that time she was treated with one-time dose of Rocephin in the office followed by Keflex. Due to subjective fevers at that time she was evaluated in the ED and discharged on clindamycin x 11days, left upper extremity ultrasound at that time showed associated superficial occlusive thrombus of left basilic vein with no DVT. ED evaluation at that time also included CTA negative for PE  Since then ambulatory referral to Rheumatology was placed by her primary oncologist. She has had persistent swelling in both hands without any new ulcerations until a few weeks ago with this current presentation.  A week ago noticed right middle finger blisters in middle of finger. It rsolved on its own and she covered with neosporin and dressed for a few days then it turned black with some purulent drainage. Then started noticing redness, numbness x 2 days. Pain extending in  to hand during that time as well. E visit --who advised being seen by provider face to face to rule out systemic infection. Denies fevers, chills. Chronic nausea. No vomiting, abd pain, no history of trauma/injury to hand. No other ulcers. No joint pain or aches. No dysuria, no diarrhea/constipation  Subjective: -Continues to feel excessively weak today, denies any fever but does complain of chills.  No chest pain.  Still short of breath and still coughing.  Has not seen hemoptysis since yesterday  Assessment & Plan: Principal Problem:   Cellulitis of right middle finger Active Problems:   Hodgkin lymphoma (HCC)   Breast cancer (HCC)   Thyroid cancer (HCC)   Adrenal insufficiency (HCC)   Hypertension   Hyperlipidemia   Edema   Right heart failure (HCC)   Nephrolithiasis   Oxygen dependent   Migraine   Hemoptysis   SOB (shortness of breath)   FUO (fever of unknown origin)   HCAP (healthcare-associated pneumonia)   Principal Problem Right third finger ulceration, with cellulitis -X-ray imaging shows involvement of subcutaneous tissue with no obvious involvement with bone.  -Dr. GAmedeo Plentywith surgery following no signs of abscess/compartment syndrome or lymphangitis. She underwent an MRI of the right hand without evidence of osteomyelitis or drainable fluid collections. -Currently on broad-spectrum antibiotics for presumed pneumonia  Active Problems Left upper lobe pneumonia with acute on chronic hypoxic respiratory failure, with concern for focal alveolar hemorrhage, possible vasculitis versus pneumonic process -Pulmonary following, plan for bronchoscopy today -ID following, recommended to complete course of Tamiflu which is to be done on 2/19.  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  Followed by [facility] pulmonology.  Small-volume hemoptysis -Possibly related to #2 versus vasculitic process, to undergo bronchoscopy today  Subacute history of blisters developing and also bilateral hand and arm swelling -This is been ongoing for last 3 months,?  Autoimmune process.  Except for nonspecific elevated sed rate and nonspecific elevated rheumatoid factor her autoimmune work-up has been unrevealing.  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  Continue Lasix  Hypokalemia/hypophosphatemia -Monitor and replete  Normocytic anemia -Hemoglobin overall stable   Scheduled Meds: . [MAR Hold] alteplase  2 mg Intracatheter Once  . [MAR Hold] budesonide  0.5 mg Inhalation BID  . [MAR Hold] clonazePAM  1 mg Oral TID  . [MAR Hold] escitalopram  20 mg Oral Daily  . [MAR Hold] estradiol  2 mg Oral Daily  . [MAR Hold] fluticasone furoate-vilanterol  1 puff Inhalation Daily  . [MAR Hold] furosemide  40 mg Oral BID  . [MAR Hold] guaiFENesin  1,200 mg Oral BID  . [MAR Hold] hydrocortisone sod succinate (SOLU-CORTEF) inj  50 mg Intravenous Q12H  . [MAR Hold] ipratropium-albuterol  3 mL Nebulization BID  . [MAR Hold] levothyroxine  112 mcg Oral QAC breakfast  . [MAR Hold]  metoprolol succinate  37.5 mg Oral Daily  . oseltamivir  75 mg Oral BID  . [MAR Hold] potassium citrate  10 mEq Oral Daily  . [MAR Hold] rosuvastatin  10 mg Oral Daily  . [MAR Hold] sodium chloride flush  10-40 mL Intracatheter Q12H  . [MAR Hold] topiramate  150 mg Oral QHS   Continuous Infusions: . sodium chloride 10 mL/hr at 11/15/18 0553  . [MAR Hold] ceFEPime (MAXIPIME) IV 2 g (11/19/18 0216)  . [MAR Hold] small volume/piggyback builder 100 mL/hr at 11/19/18 0102  . lactated ringers    . [MAR Hold] metronidazole 500 mg (11/19/18 0026)   PRN Meds:.0.9 % irrigation (POUR BTL), [MAR Hold] acetaminophen, [MAR Hold] albuterol, [MAR Hold] cyclobenzaprine, [MAR Hold] small volume/piggyback builder, [MAR Hold] fentaNYL (SUBLIMAZE) injection, [MAR Hold] midazolam, [MAR Hold] phenol, [MAR Hold] sodium chloride flush, [MAR Hold] sodium chloride flush, [MAR Hold] sucralfate, [MAR Hold] traMADol, [MAR Hold] zolpidem  DVT prophylaxis: SCDs Code Status: Full code Family Communication: No family at bedside Disposition Plan: To be determined  Consultants:   Pulmonology  Infectious disease  Procedures:   Bronchoscopy 2/19  Antimicrobials:  Cefepime, metronidazole, Tamiflu  Objective: Vitals:   11/18/18 0934 11/18/18 2122 11/19/18 0430 11/19/18 0954  BP:  116/76 103/68   Pulse:  85 77   Resp:  20 20   Temp:  98 F (36.7 C) 97.9 F (36.6 C)   TempSrc:  Oral Oral   SpO2: 98% 100% 100%   Weight:    80 kg  Height:  _0  (1.651 m)    Intake/Output Summary (Last 24 hours) at 11/19/2018 1125 Last data filed at 11/18/2018 1420 Gross per 24 hour  Intake 480 ml  Output -  Net 480 ml   Filed Weights   11/07/18 1635 11/19/18 0954  Weight: 80 kg 80 kg    Examination:  Constitutional: NAD Eyes: PERRL, lids and conjunctivae normal ENMT: Mucous membranes are moist. No oropharyngeal exudates Neck: normal, supple, no masses, no thyromegalyauscultation bilaterally, no wheezing, no  crackles.  Overall distant breath sounds Cardiovascular: Regular rate and rhythm, no murmurs / rubs / gallops. No LE edema. Abdomen: no tenderness. Bowel sounds positive.  Musculoskeletal: no clubbing / cyanosis.  Neurologic: CN 2-12 grossly intact. Strength 5/5 in all 4.  Psychiatric: Normal judgment and insight. Alert and oriented x 3. Normal mood.    Data Reviewed: I have independently reviewed following labs and imaging studies   CBC: Recent Labs  Lab 11/15/18 0500 11/16/18 1029 11/17/18 0415 11/18/18 0902 11/19/18 0438  WBC 12.7* 9.5 8.2 9.9 10.3  NEUTROABS 9.4* 6.4 6.8 7.6 7.0  HGB 10.2* 10.3* 10.6* 10.0* 9.9*  HCT 33.4* 34.4* 34.1* 32.9* 33.0*  MCV 94.1 94.8 93.4 94.3 94.0  PLT 246 281 300 325 579   Basic Metabolic Panel: Recent Labs  Lab 11/15/18 0500 11/16/18 1029 11/17/18 0415 11/18/18 0902 11/19/18 0438  NA 136 139 138 139 139  K 3.5 3.4* 3.7 3.2* 3.1*  CL 103 107 105 106 104  CO2 _1 GLUCOSE 115* 102* 193* 130* 120*  BUN _2 CREATININE 0.99 0.90 0.85 0.94 0.91  CALCIUM 8.2* 8.6* 8.3* 8.8* 8.7*  MG 2.3 2.2 2.3 2.2 2.3  PHOS 2.4* 3.3 2.7 2.7 4.2   GFR: Estimated Creatinine Clearance: 79.9 mL/min (by C-G formula based on SCr of 0.91 mg/dL). Liver Function Tests: Recent Labs  Lab 11/15/18 0500 11/16/18 1029 11/17/18 0415 11/18/18 0902 11/19/18 0438  AST _3 32 26  ALT 25 33 35 37 32  ALKPHOS 52 59 68 63 58  BILITOT 0.4 0.4 0.5 <0.1* 0.1*  PROT 6.6 7.0 6.6 6.6 6.2*  ALBUMIN 2.9* 3.0* 2.9* 2.9* 2.8*   No results for input(s): LIPASE, AMYLASE in the last 168 hours. No results for input(s): AMMONIA in the last 168 hours. Coagulation Profile: No results for input(s): INR, PROTIME in the last 168 hours. Cardiac Enzymes: No results for input(s): CKTOTAL, CKMB, CKMBINDEX, TROPONINI in the last 168 hours. BNP (last 3 results) No results for input(s): PROBNP in the last 8760 hours. HbA1C: No results for input(s): HGBA1C  in the last 72 hours. CBG: Recent Labs  Lab 11/12/18 1216 11/13/18 1116 11/18/18 1122  GLUCAP 94 112* 110*   Lipid Profile: No results for input(s): CHOL, HDL, LDLCALC, TRIG, CHOLHDL, LDLDIRECT in the last 72 hours. Thyroid Function Tests: No results for input(s): TSH, T4TOTAL, FREET4, T3FREE, THYROIDAB in the last 72 hours. Anemia Panel: Recent Labs    11/18/18 0902  VITAMINB12 436  FOLATE 12.1  FERRITIN 78  TIBC 337  IRON 32  RETICCTPCT 1.6   Urine analysis:    Component Value Date/Time   COLORURINE YELLOW 11/13/2018 0520   APPEARANCEUR CLOUDY (A) 11/13/2018 0520   LABSPEC 1.017 11/13/2018 0520   PHURINE 8.0 11/13/2018 0520   GLUCOSEU NEGATIVE 11/13/2018 0520   HGBUR NEGATIVE 11/13/2018 0520   BILIRUBINUR NEGATIVE 11/13/2018 0520   KETONESUR NEGATIVE 11/13/2018 0520   PROTEINUR NEGATIVE 11/13/2018 0520  UROBILINOGEN 1.0 06/16/2015 1045   NITRITE NEGATIVE 11/13/2018 0520   LEUKOCYTESUR NEGATIVE 11/13/2018 0520   Sepsis Labs: Invalid input(s): PROCALCITONIN, LACTICIDVEN  Recent Results (from the past 240 hour(s))  Expectorated sputum assessment w rflx to resp cult     Status: None   Collection Time: 11/11/18  3:21 PM  Result Value Ref Range Status   Specimen Description EXPECTORATED SPUTUM  Final   Special Requests NONE  Final   Sputum evaluation   Final    Sputum specimen not acceptable for testing.  Please recollect.   RESULT CALLED TO, READ BACK BY AND VERIFIED WITH: R RAKHIMOVA RN 2255 11/11/18 A BROWNING Performed at [facility], [place] [address], [place], [place] [address]    Report Status 11/11/2018 FINAL  Final  Expectorated sputum assessment w rflx to resp cult     Status: None   Collection Time: 11/12/18  3:31 AM  Result Value Ref Range Status   Specimen Description EXPECTORATED SPUTUM  Final   Special Requests NONE  Final   Sputum evaluation   Final    Sputum specimen not acceptable for testing.  Please recollect.   RESULT CALLED TO, READ  BACK BY AND VERIFIED WITH: RALCHIMOVA RN 11/12/18 0605 JDW Performed at [facility] [address], [place], [place] [address]    Report Status 11/12/2018 FINAL  Final  Culture, blood (routine x 2)     Status: None (Preliminary result)   Collection Time: 11/14/18  9:03 AM  Result Value Ref Range Status   Specimen Description BLOOD LEFT THUMB  Final   Special Requests   Final    BOTTLES DRAWN AEROBIC ONLY Blood Culture adequate volume   Culture   Final    NO GROWTH 4 DAYS Performed at [facility], [place] [address], [place], [place] [address]    Report Status PENDING  Incomplete  Respiratory Panel by PCR     Status: None   Collection Time: 11/15/18  8:20 AM  Result Value Ref Range Status   Adenovirus NOT DETECTED NOT DETECTED Final   Coronavirus 229E NOT DETECTED NOT DETECTED Final    Comment: (NOTE) The Coronavirus on the Respiratory Panel, DOES NOT test for the novel  Coronavirus (2019 nCoV)    Coronavirus HKU1 NOT DETECTED NOT DETECTED Final   Coronavirus NL63 NOT DETECTED NOT DETECTED Final   Coronavirus OC43 NOT DETECTED NOT DETECTED Final   Metapneumovirus NOT DETECTED NOT DETECTED Final   Rhinovirus / Enterovirus NOT DETECTED NOT DETECTED Final   Influenza A NOT DETECTED NOT DETECTED Final   Influenza B NOT DETECTED NOT DETECTED Final   Parainfluenza Virus 1 NOT DETECTED NOT DETECTED Final   Parainfluenza Virus 2 NOT DETECTED NOT DETECTED Final   Parainfluenza Virus 3 NOT DETECTED NOT DETECTED Final   Parainfluenza Virus 4 NOT DETECTED NOT DETECTED Final   Respiratory Syncytial Virus NOT DETECTED NOT DETECTED Final   Bordetella pertussis NOT DETECTED NOT DETECTED Final   Chlamydophila pneumoniae NOT DETECTED NOT DETECTED Final   Mycoplasma pneumoniae NOT DETECTED NOT DETECTED Final    Comment: Performed at [facility], [address], [place],  [address]      Radiology Studies: No results found.   Marzetta Board, MD, PhD [facility]  Contact via  [email or web address]  [place] P: [phone]  F: [phone]

## 2018-11-19 NOTE — Op Note (Signed)
Video Bronchoscopy with Endobronchial Ultrasound Procedure Note  Date of Operation: 11/19/2018  Pre-op Diagnosis: Hemoptysis, Left lung infiltrate  Post-op Diagnosis: Same as above  Surgeon: Rodman Pickle, MD  Assistants: Per EMR  Anesthesia: General endotracheal anesthesia  Operation: Flexible video fiberoptic bronchoscopy with endobronchial ultrasound and biopsies.  Estimated Blood Loss: Minimal  Complications: None  Indications and History: [name] is a 48 y.o. female with history of Hodgkin's, breast, thyroid and cervical cancer in remission, chronic hypoxemic respiratory failure secondary to unknown etiology who presents with new onset hemoptysis.  The risks, benefits, complications, treatment options and expected outcomes were discussed with the patient.  The possibilities of pneumothorax, pneumonia, reaction to medication, pulmonary aspiration, perforation of a viscus, bleeding, failure to diagnose a condition and creating a complication requiring transfusion or operation were discussed with the patient who freely signed the consent.    Description of Procedure: The patient was examined in the preoperative area and history and data from the preprocedure consultation were reviewed. It was deemed appropriate to proceed.  The patient was taken to OR, identified as [name] and the procedure verified as Flexible Video Fiberoptic Bronchoscopy.  A Time Out was held and the above information confirmed. After being taken to the operating room general anesthesia was initiated and the patient  was orally intubated. The video fiberoptic bronchoscope was introduced via the endotracheal tube and a general inspection was performed which showed normal anatomy of RUL, RLL, LUL and LLL and their subsegments. Entry of RML was noted to be narrowed and mildly friable with contact with bronchoscope however no active bleed visualized. Thick white secretions were present throughout airway and were  easily suctioned. BAL was performed in left lingula with 120cc instilled and 28cc returned. BAL was sent for cell count, culture, AFB, fungal culture, cytology and RVP. The standard scope was then withdrawn and the endobronchial ultrasound was used to visualize station 7. Using real-time ultrasound guidance needle biopsies x 3 were take from Station 7 nodes and were sent for cytology. The patient tolerated the procedure well without apparent complications. There was no significant blood loss. The bronchoscope was withdrawn. Anesthesia was reversed and the patient was taken to the PACU for recovery.   Samples: 1. BAL from Left lingula 2. Needle biopsies x 3 from carina/station 7  Plans:  The patient will be discharged from the PACU to home when recovered from anesthesia. We will review the cytology, pathology and microbiology results with the patient when they become available.    Rodman Pickle, M.D. [facility] Pulmonary/Critical Care Medicine  LUL    LLL   RML and RLL    RUL

## 2018-11-19 NOTE — Anesthesia Postprocedure Evaluation (Signed)
Anesthesia Post Note  Patient: [name]  Procedure(s) Performed: VIDEO BRONCHOSCOPY WITH ENDOBRONCHIAL ULTRASOUND (N/A )     Patient location during evaluation: PACU Anesthesia Type: General Level of consciousness: awake Pain management: pain level controlled Vital Signs Assessment: post-procedure vital signs reviewed and stable Respiratory status: spontaneous breathing Cardiovascular status: stable Postop Assessment: no apparent nausea or vomiting Anesthetic complications: no    Last Vitals:  Vitals:   11/19/18 1242 11/19/18 1358  BP: 130/82 125/67  Pulse: 80 77  Resp: 19 17  Temp: (!) 36.4 C 36.6 C  SpO2: 100% 100%    Last Pain:  Vitals:   11/19/18 1358  TempSrc: Oral  PainSc:                  [name]

## 2018-11-20 ENCOUNTER — Encounter (HOSPITAL_COMMUNITY): Payer: Self-pay | Admitting: Pulmonary Disease

## 2018-11-20 LAB — CBC
HCT: 32.7 % — ABNORMAL LOW (ref 36.0–46.0)
Hemoglobin: 10 g/dL — ABNORMAL LOW (ref 12.0–15.0)
MCH: 28.1 pg (ref 26.0–34.0)
MCHC: 30.6 g/dL (ref 30.0–36.0)
MCV: 91.9 fL (ref 80.0–100.0)
Platelets: 386 10*3/uL (ref 150–400)
RBC: 3.56 MIL/uL — ABNORMAL LOW (ref 3.87–5.11)
RDW: 13.2 % (ref 11.5–15.5)
WBC: 11.7 10*3/uL — ABNORMAL HIGH (ref 4.0–10.5)
nRBC: 0 % (ref 0.0–0.2)

## 2018-11-20 LAB — BASIC METABOLIC PANEL
Anion gap: 12 (ref 5–15)
BUN: 19 mg/dL (ref 6–20)
CO2: 23 mmol/L (ref 22–32)
Calcium: 8.3 mg/dL — ABNORMAL LOW (ref 8.9–10.3)
Chloride: 104 mmol/L (ref 98–111)
Creatinine, Ser: 0.89 mg/dL (ref 0.44–1.00)
GFR calc Af Amer: >60 mL/min (ref >60)
GFR calc non Af Amer: >60 mL/min (ref >60)
Glucose, Bld: 185 mg/dL — ABNORMAL HIGH (ref 70–99)
Potassium: 3.2 mmol/L — ABNORMAL LOW (ref 3.5–5.1)
Sodium: 139 mmol/L (ref 135–145)

## 2018-11-20 LAB — ACID FAST SMEAR (AFB, MYCOBACTERIA)

## 2018-11-20 LAB — ACID FAST SMEAR (AFB): ACID FAST SMEAR - AFSCU2: NEGATIVE

## 2018-11-20 MED ORDER — HEPARIN SOD (PORK) LOCK FLUSH 100 UNIT/ML IV SOLN
500.0000 [IU] | INTRAVENOUS | Status: AC | PRN
  Administered 2018-11-20: 500 [IU]

## 2018-11-20 MED ORDER — LEVOFLOXACIN 500 MG PO TABS: 500.0000 mg | ORAL_TABLET | Freq: Every day | ORAL | 0 refills | Status: DC

## 2018-11-20 NOTE — Progress Notes (Signed)
Pharmacy Antibiotic Note  [name] is a 48 y.o. female admitted on 11/07/2018 with progressively worsening pain and swelling of the right hand.  Imaging shows new lesions on left wrist/forearms.  Pharmacy has been consulted for cefepime dosing.  Patient is also on Flagyl and is s/p Tamiflu.  She had increasing hemoptysis, now s/p bronchoscopy and biopsy on 11/19/18.  Renal function is stable.  Afebrile, WBC up to 11.7.  Plan: Continue cefepime 2gm IV Q8H for [place] coverage Flagyl 526m IV Q8H per MD Monitor renal fxn, micro data, clinical progress  Height: _0  (165.1 cm) Weight: 176 lb 5.9 oz (80 kg) IBW/kg (Calculated) : 57  Temp (24hrs), Avg:97.9 F (36.6 C), Min:97.5 F (36.4 C), Max:99 F (37.2 C)  Recent Labs  Lab 11/16/18 1029 11/17/18 0415 11/18/18 0902 11/19/18 0438 11/20/18 0345  WBC 9.5 8.2 9.9 10.3 11.7*  CREATININE 0.90 0.85 0.94 0.91 0.89    Estimated Creatinine Clearance: 81.7 mL/min (by C-G formula based on SCr of 0.89 mg/dL).    Allergies  Allergen Reactions  . Cymbalta [Duloxetine Hcl] Anxiety  . Hydromorphone Other (See Comments)    BP drop and heart rate drops  . Buprenorphine Hcl Hives  . Compazine     unknown  . Metoclopramide Other (See Comments)    contractures  . Morphine And Related Hives  . Na Ferric Gluc Cplx In Sucrose Other (See Comments)    unknown  . Ondansetron Rash    Whelps  . Promethazine Hcl Hives  . Tegaderm Ag Mesh [Silver] Rash    Old formulation only, is able to tolerate new formulation    Keflex >> Clinda PTA Vanc 2/7 >> 2/9 2/10 doxy > 2/11 2/11 clinda > 2/12 2/12 cefazolin > 2/15 Cefepime 2/15 > Flagyl 2/15 > Tamiflu 2/14 >> (2/19)  2/11 sputum cx - negative 2/14 BCx - negative 2/15 resp panle PCR - negative 2/19 BAL cx -  2/19 fungus cx -   Latham Kinzler D. DMina Marble PharmD, BCPS, BShaw Heights2/20/2020, 10:30 AM

## 2018-11-20 NOTE — Progress Notes (Signed)
Discharge instructions completed with pt.  Pt verbalized understanding of the information.  Pt denies chest pain, shortness of breath, dizziness, lightheadedness, and n/v.  Pt's portacath deaccessed.  Pt discharged home.

## 2018-11-20 NOTE — Plan of Care (Signed)
  Problem: Pain Managment: Goal: General experience of comfort will improve Outcome: Progressing   Problem: Safety: Goal: Ability to remain free from injury will improve Outcome: Progressing

## 2018-11-20 NOTE — Discharge Summary (Signed)
Physician Discharge Summary  [name] UVO:[id number] DOB: [birth year] DOA: 11/07/2018  PCP: Su Grand, MD  Admit date: 11/07/2018 Discharge date: 11/20/2018  Admitted From: home Disposition:  home  Recommendations for Outpatient Follow-up:  1. Follow up with PCP in 1-2 weeks 2. Follow-up with Dr. Loanne Drilling in a month as scheduled  Home Health: none Equipment/Devices: none  Discharge Condition: stable CODE STATUS: Full code Diet recommendation: regular  HPI: Per admitting MD, [name] is a 48 y.o. female with an extensive past medical history including hypothyroid (s/p thyroidectomy for thyroid cancer), osteoporosis, hypertension, hodgkin's lymphoma age 52 now in remission, breast cancer with multiple recurrences s/p bilateral mastectomy now in remission, pituitary adenoma s/p resection, chronic adrenal insufficiency, nephrolithiasis, cervical cancer s/p LEEP procedure, fibroelastosis of lung with pulmonary hypertension, chronic hypoxic respiratory failure on home oxygen, who presents with above. She reports she has had several months of bilateral upper extremity swelling greatest in the hands. This has been complicated by the spontaneous occurrence of blisters that typically heal on their own. A few weeks ago she developed redness surrounding one of the burst blisters and was placed on antibiotics (keflex transitioned to clindamycin), and the problem resolved. Now about a week ago several small blisters appeared on the dorsal surface of her right 3rd finger and have progressed to dark ulcers with surrounding redness. She denies fever. No current discharge. She says that finger has become somewhat numb and she can not flex it as well as her other fingers. The swelling has been evaluated with venous u/s negative for DVT (but did find superficial thrombosis) and CTA of chest (negative for PE or other vascular abnormalities) Majority of pt's specialist providers are at [facility] (oncology,  neurology, GI, and pulmonology). Sees a [facility] cardiologist and [place] PCP.  Hospital Course: Principal Problem Right third finger ulceration, with cellulitis -X-ray imaging shows involvement of subcutaneous tissue with no obvious involvement with bone. Dr. Amedeo Plenty with surgery following no signs of abscess/compartment syndrome or lymphangitis. She underwent an MRI of the right hand without evidence of osteomyelitis or drainable fluid collections.  She underwent a biopsy by general surgery which was unrevealing.  This appears to be healing well without any further surrounding cellulitis.  Active Problems Left upper lobe pneumonia with acute on chronic hypoxic respiratory failure, with concern for focal alveolar hemorrhage, possible vasculitis versus pneumonic process with small volume hemoptysis-pulmonary as well as infectious disease were consulted and followed patient while hospitalized.  They recommended to complete a course of Tamiflu which was finished on 2/19.  She was also maintained on broad-spectrum antibiotics with cefepime as well as metronidazole, and I discussed with infectious disease Dr. Drucilla Schmidt on the day of discharge and will place patient on Levaquin for a few additional days.  Pulmonary was also consulted due to her hemoptysis underwent a bronchoscopy on 2/19, relatively bland blood culture data is pending at the time of this dictation.  She is chronic hypoxia on 3 L nasal cannula at home, she had extensive outpatient evaluation and is followed by pulmonology at [facility]. Subacute history of blisters developing and also bilateral hand and arm swelling -This is been ongoing for last 3 months,?  Autoimmune process.  Except for nonspecific elevated sed rate and nonspecific elevated rheumatoid factor her autoimmune work-up has been unrevealing.  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  Continue Lasix Normocytic anemia -Hemoglobin overall stable  Discharge Diagnoses:  Principal Problem:   Cellulitis of right middle finger Active Problems:   Hodgkin lymphoma (HCC)   Breast cancer (HCC)   Thyroid cancer (HCC)   Adrenal insufficiency (HCC)   Hypertension   Hyperlipidemia   Edema   Right heart failure (HCC)   Nephrolithiasis   Oxygen dependent   Migraine   Hemoptysis   SOB (shortness of breath)   FUO (fever of unknown origin)   HCAP (healthcare-associated pneumonia)     Discharge Instructions   Allergies as of 11/20/2018      Reactions   Cymbalta [duloxetine Hcl] Anxiety   Hydromorphone Other (See Comments)   BP drop and heart rate drops   Buprenorphine Hcl Hives   Compazine    unknown   Metoclopramide Other (See Comments)   contractures   Morphine And Related Hives   Na Ferric Gluc Cplx In Sucrose Other (See Comments)   unknown   Ondansetron Rash   Whelps   Promethazine Hcl Hives   Tegaderm Ag Mesh [silver] Rash   Old formulation only, is able to tolerate new formulation      Medication List    TAKE these medications   beclomethasone 80 MCG/ACT inhaler Commonly known as:  QVAR Inhale 2 puffs into the lungs 2 (two) times daily.   BREO ELLIPTA 200-25 MCG/INH Aepb Generic drug:  fluticasone furoate-vilanterol INHALE 1 INHALATION INTO THE LUNGS ONCE DAILY.   CAMBIA 50 MG Pack Generic drug:  Diclofenac Potassium Take 1 packet by mouth 3 (three) times daily as needed. AS NEEDED  FOR HEADACHES   clonazePAM 1 MG tablet Commonly known as:  KLONOPIN Take 1 mg by mouth 3 (three) times daily.   DEXILANT 60 MG capsule Generic drug:  dexlansoprazole Take 60 mg by mouth daily.   diphenhydrAMINE 50 MG/ML injection Commonly known as:  BENADRYL Inject 25 mg into the vein every 6 (six) hours as needed (nausea).   escitalopram 20 MG tablet Commonly known as:  LEXAPRO Take 20 mg by mouth daily.   estradiol 2 MG tablet Commonly known as:  ESTRACE Take 2 mg by mouth daily.   furosemide 20 MG tablet Commonly known as:  LASIX Take 20 mg by mouth 2 (two) times daily.   gi cocktail Susp suspension Take 30 mLs daily as needed by mouth for indigestion (BLEEDING ULCERS). Shake well.   hydrocortisone 10 MG tablet Commonly known as:  CORTEF Take 5-10 mg by mouth 2 (two) times daily. Take 10 mg in the am and 5 mg in the pm   levofloxacin 500 MG tablet Commonly known as:  LEVAQUIN Take 1 tablet (500 mg total) by mouth daily.   levothyroxine 112 MCG tablet Commonly known as:  SYNTHROID, LEVOTHROID Take 112 mcg by mouth daily before breakfast.   metoprolol succinate 25 MG 24 hr tablet Commonly known as:  TOPROL-XL Take 37.5 mg by mouth daily.   OVER THE COUNTER MEDICATION Take 500 mg by mouth daily. Tumeric cucurmin   potassium chloride 10 MEQ tablet Commonly known as:  K-DUR Take 1 tablet (10 mEq total) by mouth daily.   potassium citrate  10 MEQ (1080 MG) SR tablet Commonly known as:  UROCIT-K Take 10 mEq by mouth daily. Break pill in half to minimize discomfort/difficulty swallowing.   rosuvastatin 10 MG tablet Commonly known as:  CRESTOR Take 10 mg by mouth daily.   SLOW RELEASE IRON 45 MG Tbcr Generic drug:  Ferrous Sulfate Dried Take 1 tablet at bedtime by mouth.   sucralfate 1 g tablet Commonly known as:  CARAFATE Take 1 g as needed by mouth (FOR ULCERS).   topiramate 100 MG tablet Commonly known as:  TOPAMAX Take 150 mg by mouth at bedtime.     traMADol 50 MG tablet Commonly known as:  ULTRAM Take 50 mg by mouth daily as needed (migraine).   VENTOLIN HFA 108 (90 Base) MCG/ACT inhaler Generic drug:  albuterol Inhale 2 puffs into the lungs every 6 (six) hours as needed for wheezing or shortness of breath.   zolpidem 12.5 MG CR tablet Commonly known as:  AMBIEN CR Take 12.5 mg by mouth at bedtime.      Follow-up Information    [facility] Follow up on 12/30/2018.   Specialty:  Pulmonology Why:  Your appointment for PFT's is at 10:00AM Contact information: [address] [place] [address] [phone]       Margaretha Seeds, MD Follow up on 12/30/2018.   Specialty:  Pulmonary Disease Why:  Your appointment is at 1:30PM Contact information: [place] [address] [phone]           Consultations:  Pulmonary   ID  Procedures/Studies:  Bronchoscopy 2/19  2D echo IMPRESSIONS    1. The left ventricle has normal systolic function of 64-68%. The cavity size was normal. There is no increased left ventricular wall thickness. Left ventricular diastology could not be evaluated due to nondiagnostic images.  2. The right ventricle has normal systolic function. The cavity was normal . There is no increase in right ventricular wall thickness.  3. There is mild to moderate mitral annular calcification present.  4. The tricuspid valve was normal in structure. Tricuspid valve regurgitation is trivial by color flow Doppler.  5. The aortic valve is tricuspid There is mild thickening and moderate calcification of the aortic valve. Aortic valve regurgitation is mild to moderate by color flow Doppler.  6. The pulmonic valve was normal in structure.   Ct Chest W Contrast  Result Date: 11/10/2018 CLINICAL DATA:  History of breast cancer. New arm pain and swelling. Additional history of GYN carcinoma. EXAM: CT CHEST WITH CONTRAST TECHNIQUE: Multidetector  CT imaging of the chest was performed during intravenous contrast administration. CONTRAST:  78m OMNIPAQUE IOHEXOL 300 MG/ML  SOLN COMPARISON:  CT 05/20/2015 FINDINGS: Cardiovascular: Port in the anterior chest wall with tip in distal SVC. No significant vascular findings. Normal heart size. No pericardial effusion. Mediastinum/Nodes: No axillary supraclavicular adenopathy. No mediastinal hilar adenopathy. No pericardial effusion. Esophagus normal Lungs/Pleura: 5 mm nodule in the RIGHT middle lobe along the fissure is unchanged. (Image 75/4) no new pulmonary nodules identified. Airways normal. On coronal imaging there is a convex lesion over the diaphragm which is fat density. This corresponds to the nodule described on comparison CT. Findings consistent with benign fat density subpleural lesion Upper Abdomen: Limited view of the liver, kidneys, pancreas are unremarkable. Normal adrenal glands. Musculoskeletal: No acute osseous abnormality. IMPRESSION: 1. No evidence thoracic metastasis. 2. No change from comparison exam. Electronically Signed   By: SHelane GuntherD.  On: 11/10/2018 09:16   Mr Hand Right W Wo Contrast  Result Date: 11/12/2018 CLINICAL DATA:  48 year old female with 3 month history of bilateral hand swelling with intermittent ulcerations. Patient developed blisters to the right hand and developed an ulcer with worsening erythema and pain of the right middle finger. EXAM: MRI OF THE RIGHT HAND WITHOUT AND WITH CONTRAST TECHNIQUE: Multiplanar, multisequence MR imaging of the right hand was performed before and after the administration of intravenous contrast. CONTRAST:  8 mL Gadavist IV COMPARISON:  11/07/2018 hand radiographs FINDINGS: Bones/Joint/Cartilage No marrow signal abnormality of the included hand and wrist. No fracture, joint dislocation nor findings of osteomyelitis. No significant erosive change nor suspicious osseous lesions. Ligaments Intact. No thickening of the transverse  ligament of the carpal tunnel. Muscles and Tendons Intact flexor and extensor tendons without evidence of tenosynovitis. Unremarkable carpal tunnel. Median nerve is unremarkable. No evidence of myositis or pyomyositis. Soft tissues Nonspecific subcutaneous soft tissue edema of the included dorsum of the hand consistent with cellulitis with extension of soft tissue edema involving the second through fourth digits, greatest along the middle finger. Drainable fluid collections are identified. IMPRESSION: 1. Findings in keeping cellulitis of the hand and in particular the second through fourth digits and more so involving the middle finger. No drainable fluid collections are identified to suggest abscess. 2. No evidence of osteomyelitis. Electronically Signed   By: Ashley Royalty M.D.   On: 11/12/2018 23:19   Dg Chest Port 1 View  Result Date: 11/17/2018 CLINICAL DATA:  Shortness of breath.  Pneumonia. EXAM: PORTABLE CHEST 1 VIEW COMPARISON:  Multiple chest x-rays since November 13, 2018. FINDINGS: The infiltrate in the lateral left mid lung is similar since yesterday but increased since November 13, 2018. No pneumothorax. Stable right Port-A-Cath. The cardiomediastinal silhouette is unchanged. IMPRESSION: Stable infiltrate in the periphery of the left mid lung, similar in the interval. In the appropriate clinical setting, this would be consistent with pneumonia. Electronically Signed   By: Dorise Bullion III M.D   On: 11/17/2018 08:17   Dg Chest Port 1 View  Result Date: 11/16/2018 CLINICAL DATA:  Shortness of breath. Follow-up LEFT UPPER LOBE pneumonia. EXAM: PORTABLE CHEST 1 VIEW COMPARISON:  11/15/2018 and earlier. FINDINGS: Heart size upper normal, unchanged. Airspace consolidation in the LEFT UPPER LOBE, unchanged since yesterday. No new pulmonary parenchymal abnormalities. No pleural effusions. RIGHT jugular Port-A-Cath tip projects at or near the cavoatrial junction, unchanged. Surgical clips in both  axilla from prior node dissection. IMPRESSION: Stable LEFT UPPER LOBE pneumonia.  No new abnormalities. Electronically Signed   By: Evangeline Dakin M.D.   On: 11/16/2018 09:25   Dg Chest Port 1 View  Result Date: 11/15/2018 CLINICAL DATA:  Shortness of breath today. EXAM: PORTABLE CHEST 1 VIEW COMPARISON:  11/14/2018 and older exams. FINDINGS: Left upper lobe consolidation is stable from the previous day's exam. No new lung abnormalities. No pneumothorax or pleural effusion. Stable right-sided Port-A-Cath. IMPRESSION: 1. No change from the previous day's study. 2. Left upper lobe consolidation which has worsened when compared to a chest radiograph dated 11/13/2018. Electronically Signed   By: Lajean Manes M.D.   On: 11/15/2018 13:48   Dg Chest Port 1 View  Result Date: 11/14/2018 CLINICAL DATA:  Shortness of breath and cough EXAM: PORTABLE CHEST 1 VIEW COMPARISON:  Yesterday FINDINGS: Progression of left upper lobe airspace disease compatible with pneumonia. There is also history of hemoptysis and alveolar hemorrhage is possible. Porta catheter in good position.  Normal heart size. IMPRESSION: Progressive left upper lobe airspace opacity which could be pneumonia or alveolar hemorrhage based on the history. Electronically Signed   By: Monte Fantasia M.D.   On: 11/14/2018 08:56   Dg Chest Port 1 View  Result Date: 11/13/2018 CLINICAL DATA:  Shortness of breath EXAM: PORTABLE CHEST 1 VIEW COMPARISON:  Chest CT from 3 days ago FINDINGS: Small focus of airspace disease in the left lung that was not seen previously. Normal heart size and mediastinal contours. Right port in unremarkable position. IMPRESSION: Small airspace opacity on the left that was not seen on chest CT 3 days ago and consistent with an inflammatory process, presumably pneumonia. Electronically Signed   By: Monte Fantasia M.D.   On: 11/13/2018 09:35   Dg Hand Complete Right  Result Date: 11/07/2018 CLINICAL DATA:  48 year old female  with right hand pain and swelling. No known injury. EXAM: RIGHT HAND - COMPLETE 3+ VIEW COMPARISON:  None. FINDINGS: Bone mineralization is within normal limits. There is no evidence of fracture or dislocation. There is no age advanced arthropathy or other focal bone abnormality. Generalized soft tissue swelling with evidence of subcutaneous edema along the dorsal metacarpals. No soft tissue gas. No radiopaque foreign body identified. IMPRESSION: Soft tissue swelling with no acute osseous abnormality identified. Electronically Signed   By: Genevie Ann M.D.   On: 11/07/2018 18:35   Vas [place] Upper Extremity Venous Duplex  Result Date: 11/09/2018 UPPER VENOUS STUDY  Indications: Swelling, and Pain Performing Technologist: Abram Sander RVS  Examination Guidelines: A complete evaluation includes B-mode imaging, spectral Doppler, color Doppler, and power Doppler as needed of all accessible portions of each vessel. Bilateral testing is considered an integral part of a complete examination. Limited examinations for reoccurring indications may be performed as noted.  Right Findings: +----------+------------+----------+---------+-----------+-------+ RIGHT     CompressiblePropertiesPhasicitySpontaneousSummary +----------+------------+----------+---------+-----------+-------+ IJV           Full                 Yes       Yes            +----------+------------+----------+---------+-----------+-------+ Subclavian    Full                 Yes       Yes            +----------+------------+----------+---------+-----------+-------+ Axillary      Full                 Yes       Yes            +----------+------------+----------+---------+-----------+-------+ Brachial      Full                 Yes       Yes            +----------+------------+----------+---------+-----------+-------+ Radial        Full                                           +----------+------------+----------+---------+-----------+-------+ Ulnar         Full                                          +----------+------------+----------+---------+-----------+-------+ Cephalic  Full                                          +----------+------------+----------+---------+-----------+-------+ Basilic       Full                                          +----------+------------+----------+---------+-----------+-------+  Left Findings: +----------+------------+----------+---------+-----------+-------+ LEFT      CompressiblePropertiesPhasicitySpontaneousSummary +----------+------------+----------+---------+-----------+-------+ Subclavian                         Yes       Yes            +----------+------------+----------+---------+-----------+-------+  Summary:  Right: No evidence of deep vein thrombosis in the upper extremity. No evidence of superficial vein thrombosis in the upper extremity.  Left: No evidence of thrombosis in the subclavian.  *See table(s) above for measurements and observations.  Diagnosing physician: Monica Martinez MD Electronically signed by Monica Martinez MD on 11/09/2018 at 2:35:57 PM.    Final       Subjective: - no chest pain, shortness of breath, no abdominal pain, nausea or vomiting.   Discharge Exam: Vitals:   11/19/18 2040 11/20/18 0509  BP: 115/73 94/66  Pulse: 80 80  Resp: 16 18  Temp: 97.7 F (36.5 C) 99 F (37.2 C)  SpO2: 100% 100%    General: Pt is alert, awake, not in acute distress Cardiovascular: RRR, S1/S2 +, no rubs, no gallops Respiratory: CTA bilaterally, no wheezing, no rhonchi Abdominal: Soft, NT, ND, bowel sounds + Extremities: no edema, no cyanosis    The results of significant diagnostics from this hospitalization (including imaging, microbiology, ancillary and laboratory) are listed below for reference.     Microbiology: Recent Results (from the past 240 hour(s))    Expectorated sputum assessment w rflx to resp cult     Status: None   Collection Time: 11/11/18  3:21 PM  Result Value Ref Range Status   Specimen Description EXPECTORATED SPUTUM  Final   Special Requests NONE  Final   Sputum evaluation   Final    Sputum specimen not acceptable for testing.  Please recollect.   RESULT CALLED TO, READ BACK BY AND VERIFIED WITH: R RAKHIMOVA RN 2255 11/11/18 A BROWNING Performed at [facility], [place] [address], [place], [place] [address]    Report Status 11/11/2018 FINAL  Final  Expectorated sputum assessment w rflx to resp cult     Status: None   Collection Time: 11/12/18  3:31 AM  Result Value Ref Range Status   Specimen Description EXPECTORATED SPUTUM  Final   Special Requests NONE  Final   Sputum evaluation   Final    Sputum specimen not acceptable for testing.  Please recollect.   RESULT CALLED TO, READ BACK BY AND VERIFIED WITH: RALCHIMOVA RN 11/12/18 0605 JDW Performed at [place] [address], [place], [place] [address]    Report Status 11/12/2018 FINAL  Final  Culture, blood (routine x 2)     Status: None   Collection Time: 11/14/18  9:03 AM  Result Value Ref Range Status   Specimen Description BLOOD LEFT THUMB  Final   Special Requests   Final    BOTTLES DRAWN AEROBIC ONLY Blood Culture adequate  volume   Culture   Final    NO GROWTH 5 DAYS Performed at [facility], [place] [address], [place], [place] [address]    Report Status 11/19/2018 FINAL  Final  Respiratory Panel by PCR     Status: None   Collection Time: 11/15/18  8:20 AM  Result Value Ref Range Status   Adenovirus NOT DETECTED NOT DETECTED Final   Coronavirus 229E NOT DETECTED NOT DETECTED Final    Comment: (NOTE) The Coronavirus on the Respiratory Panel, DOES NOT test for the novel  Coronavirus (2019 nCoV)    Coronavirus HKU1 NOT DETECTED NOT DETECTED Final   Coronavirus NL63 NOT DETECTED NOT DETECTED Final   Coronavirus OC43 NOT DETECTED NOT DETECTED  Final   Metapneumovirus NOT DETECTED NOT DETECTED Final   Rhinovirus / Enterovirus NOT DETECTED NOT DETECTED Final   Influenza A NOT DETECTED NOT DETECTED Final   Influenza B NOT DETECTED NOT DETECTED Final   Parainfluenza Virus 1 NOT DETECTED NOT DETECTED Final   Parainfluenza Virus 2 NOT DETECTED NOT DETECTED Final   Parainfluenza Virus 3 NOT DETECTED NOT DETECTED Final   Parainfluenza Virus 4 NOT DETECTED NOT DETECTED Final   Respiratory Syncytial Virus NOT DETECTED NOT DETECTED Final   Bordetella pertussis NOT DETECTED NOT DETECTED Final   Chlamydophila pneumoniae NOT DETECTED NOT DETECTED Final   Mycoplasma pneumoniae NOT DETECTED NOT DETECTED Final    Comment: Performed at [facility], [address], [place], [place] [address]  Culture, respiratory     Status: None (Preliminary result)   Collection Time: 11/19/18 11:41 AM  Result Value Ref Range Status   Specimen Description BRONCHIAL ALVEOLAR LAVAGE  Final   Special Requests NONE  Final   Gram Stain   Final    RARE WBC PRESENT, PREDOMINANTLY PMN NO ORGANISMS SEEN    Culture   Final    NO GROWTH < 24 HOURS Performed at [facility], [place] [address], [place], [place] [address]    Report Status PENDING  Incomplete     Labs: BNP (last 3 results) No results for input(s): BNP in the last 8760 hours. Basic Metabolic Panel: Recent Labs  Lab 11/15/18 0500 11/16/18 1029 11/17/18 0415 11/18/18 0902 11/19/18 0438 11/20/18 0345  NA 136 139 138 139 139 139  K 3.5 3.4* 3.7 3.2* 3.1* 3.2*  CL 103 107 105 106 104 104  CO2 _0 GLUCOSE 115* 102* 193* 130* 120* 185*  BUN _1 CREATININE 0.99 0.90 0.85 0.94 0.91 0.89  CALCIUM 8.2* 8.6* 8.3* 8.8* 8.7* 8.3*  MG 2.3 2.2 2.3 2.2 2.3  --   PHOS 2.4* 3.3 2.7 2.7 4.2  --    Liver Function Tests: Recent Labs  Lab 11/15/18 0500 11/16/18 1029 11/17/18 0415 11/18/18 0902 11/19/18 0438  AST _2 32 26  ALT 25 33 35 37 32  ALKPHOS 52  59 68 63 58  BILITOT 0.4 0.4 0.5 <0.1* 0.1*  PROT 6.6 7.0 6.6 6.6 6.2*  ALBUMIN 2.9* 3.0* 2.9* 2.9* 2.8*   No results for input(s): LIPASE, AMYLASE in the last 168 hours. No results for input(s): AMMONIA in the last 168 hours. CBC: Recent Labs  Lab 11/15/18 0500 11/16/18 1029 11/17/18 0415 11/18/18 0902 11/19/18 0438 11/20/18 0345  WBC 12.7* 9.5 8.2 9.9 10.3 11.7*  NEUTROABS 9.4* 6.4 6.8 7.6 7.0  --   HGB 10.2* 10.3* 10.6* 10.0*  9.9* 10.0*  HCT 33.4* 34.4* 34.1* 32.9* 33.0* 32.7*  MCV 94.1 94.8 93.4 94.3 94.0 91.9  PLT 246 281 300 325 357 386   Cardiac Enzymes: No results for input(s): CKTOTAL, CKMB, CKMBINDEX, TROPONINI in the last 168 hours. BNP: Invalid input(s): POCBNP CBG: Recent Labs  Lab 11/18/18 1122  GLUCAP 110*   D-Dimer No results for input(s): DDIMER in the last 72 hours. Hgb A1c No results for input(s): HGBA1C in the last 72 hours. Lipid Profile No results for input(s): CHOL, HDL, LDLCALC, TRIG, CHOLHDL, LDLDIRECT in the last 72 hours. Thyroid function studies No results for input(s): TSH, T4TOTAL, T3FREE, THYROIDAB in the last 72 hours.  Invalid input(s): FREET3 Anemia work up Recent Labs    11/18/18 0902  VITAMINB12 436  FOLATE 12.1  FERRITIN 78  TIBC 337  IRON 32  RETICCTPCT 1.6   Urinalysis    Component Value Date/Time   COLORURINE YELLOW 11/13/2018 0520   APPEARANCEUR CLOUDY (A) 11/13/2018 0520   LABSPEC 1.017 11/13/2018 0520   PHURINE 8.0 11/13/2018 0520   GLUCOSEU NEGATIVE 11/13/2018 0520   HGBUR NEGATIVE 11/13/2018 0520   BILIRUBINUR NEGATIVE 11/13/2018 0520   KETONESUR NEGATIVE 11/13/2018 0520   PROTEINUR NEGATIVE 11/13/2018 0520   UROBILINOGEN 1.0 06/16/2015 1045   NITRITE NEGATIVE 11/13/2018 0520   LEUKOCYTESUR NEGATIVE 11/13/2018 0520   Sepsis Labs Invalid input(s): PROCALCITONIN,  WBC,  LACTICIDVEN   Time coordinating discharge: 40 minutes  SIGNED:  Marzetta Board, MD  [facility] 11/20/2018, 12:48  PM

## 2018-11-20 NOTE — Plan of Care (Signed)
  Problem: Pain Managment: Goal: General experience of comfort will improve Outcome: Progressing

## 2018-11-20 NOTE — Discharge Instructions (Signed)
Follow with Toppin, Antionette Poles, MD in 5-7 days  Please get a complete blood count and chemistry panel checked by your Primary MD at your next visit, and again as instructed by your Primary MD. Please get your medications reviewed and adjusted by your Primary MD.  Please request your Primary MD to go over all Hospital Tests and Procedure/Radiological results at the follow up, please get all Hospital records sent to your Prim MD by signing hospital release before you go home.  If you had Pneumonia of Lung problems at the Hospital: Please get a 2 view Chest X ray done in 6-8 weeks after hospital discharge or sooner if instructed by your Primary MD.  If you have Congestive Heart Failure: Please call your Cardiologist or Primary MD anytime you have any of the following symptoms:  1) 3 pound weight gain in 24 hours or 5 pounds in 1 week  2) shortness of breath, with or without a dry hacking cough  3) swelling in the hands, feet or stomach  4) if you have to sleep on extra pillows at night in order to breathe  Follow cardiac low salt diet and 1.5 lit/day fluid restriction.  If you have diabetes Accuchecks 4 times/day, Once in AM empty stomach and then before each meal. Log in all results and show them to your primary doctor at your next visit. If any glucose reading is under 80 or above 300 call your primary MD immediately.  If you have Seizure/Convulsions/Epilepsy: Please do not drive, operate heavy machinery, participate in activities at heights or participate in high speed sports until you have seen by Primary MD or a Neurologist and advised to do so again.  If you had Gastrointestinal Bleeding: Please ask your Primary MD to check a complete blood count within one week of discharge or at your next visit. Your endoscopic/colonoscopic biopsies that are pending at the time of discharge, will also need to followed by your Primary MD.  Get Medicines reviewed and adjusted. Please take all your  medications with you for your next visit with your Primary MD  Please request your Primary MD to go over all hospital tests and procedure/radiological results at the follow up, please ask your Primary MD to get all Hospital records sent to his/her office.  If you experience worsening of your admission symptoms, develop shortness of breath, life threatening emergency, suicidal or homicidal thoughts you must seek medical attention immediately by calling 911 or calling your MD immediately  if symptoms less severe.  You must read complete instructions/literature along with all the possible adverse reactions/side effects for all the Medicines you take and that have been prescribed to you. Take any new Medicines after you have completely understood and accpet all the possible adverse reactions/side effects.   Do not drive or operate heavy machinery when taking Pain medications.   Do not take more than prescribed Pain, Sleep and Anxiety Medications  Special Instructions: If you have smoked or chewed Tobacco  in the last 2 yrs please stop smoking, stop any regular Alcohol  and or any Recreational drug use.  Wear Seat belts while driving.  Please note You were cared for by a hospitalist during your hospital stay. If you have any questions about your discharge medications or the care you received while you were in the hospital after you are discharged, you can call the unit and asked to speak with the hospitalist on call if the hospitalist that took care of you is not available.  Once you are discharged, your primary care physician will handle any further medical issues. Please note that NO REFILLS for any discharge medications will be authorized once you are discharged, as it is imperative that you return to your primary care physician (or establish a relationship with a primary care physician if you do not have one) for your aftercare needs so that they can reassess your need for medications and monitor your  lab values.  You can reach the hospitalist office at phone [phone] or fax [phone]   If you do not have a primary care physician, you can call [phone] for a physician referral.  Activity: As tolerated with Full fall precautions use walker/cane & assistance as needed  Diet: regular  Disposition Home

## 2018-11-21 LAB — CULTURE, RESPIRATORY W GRAM STAIN: Culture: NO GROWTH

## 2018-11-22 LAB — RESPIRATORY VIRUS PANEL
Adenovirus: NEGATIVE
Influenza A: NEGATIVE
Influenza B: NEGATIVE
Metapneumovirus: NEGATIVE
PARAINFLUENZA 3 A: NEGATIVE
Parainfluenza 1: NEGATIVE
Parainfluenza 2: NEGATIVE
Respiratory Syncytial Virus A: NEGATIVE
Respiratory Syncytial Virus B: NEGATIVE
Rhinovirus: NEGATIVE

## 2018-12-23 ENCOUNTER — Other Ambulatory Visit: Payer: Self-pay

## 2018-12-23 ENCOUNTER — Inpatient Hospital Stay (HOSPITAL_COMMUNITY)
Admission: EM | Admit: 2018-12-23 | Discharge: 2018-12-25 | DRG: 906 | Disposition: A | Discharge status: Home / Self Care | Payer: BLUE CROSS/BLUE SHIELD | Attending: Internal Medicine | Admitting: Internal Medicine

## 2018-12-23 ENCOUNTER — Encounter (HOSPITAL_COMMUNITY)
Admission: EM | Disposition: A | Discharge status: Home / Self Care | Payer: Self-pay | Source: Home / Self Care | Attending: Internal Medicine

## 2018-12-23 ENCOUNTER — Encounter (HOSPITAL_COMMUNITY): Payer: Self-pay | Admitting: Emergency Medicine

## 2018-12-23 ENCOUNTER — Inpatient Hospital Stay (HOSPITAL_COMMUNITY): Payer: BLUE CROSS/BLUE SHIELD | Admitting: Certified Registered"

## 2018-12-23 DIAGNOSIS — G43019 Migraine without aura, intractable, without status migrainosus: Secondary | ICD-10-CM

## 2018-12-23 DIAGNOSIS — I272 Pulmonary hypertension, unspecified: Secondary | ICD-10-CM | POA: Diagnosis not present

## 2018-12-23 DIAGNOSIS — Z8571 Personal history of Hodgkin lymphoma: Secondary | ICD-10-CM | POA: Diagnosis not present

## 2018-12-23 DIAGNOSIS — I5022 Chronic systolic (congestive) heart failure: Secondary | ICD-10-CM | POA: Diagnosis present

## 2018-12-23 DIAGNOSIS — K219 Gastro-esophageal reflux disease without esophagitis: Secondary | ICD-10-CM | POA: Diagnosis present

## 2018-12-23 DIAGNOSIS — Z9013 Acquired absence of bilateral breasts and nipples: Secondary | ICD-10-CM

## 2018-12-23 DIAGNOSIS — M810 Age-related osteoporosis without current pathological fracture: Secondary | ICD-10-CM | POA: Diagnosis present

## 2018-12-23 DIAGNOSIS — S6992XA Unspecified injury of left wrist, hand and finger(s), initial encounter: Secondary | ICD-10-CM | POA: Diagnosis not present

## 2018-12-23 DIAGNOSIS — I10 Essential (primary) hypertension: Secondary | ICD-10-CM | POA: Diagnosis present

## 2018-12-23 DIAGNOSIS — L03012 Cellulitis of left finger: Secondary | ICD-10-CM | POA: Diagnosis present

## 2018-12-23 DIAGNOSIS — I50810 Right heart failure, unspecified: Secondary | ICD-10-CM | POA: Diagnosis present

## 2018-12-23 DIAGNOSIS — Z86018 Personal history of other benign neoplasm: Secondary | ICD-10-CM | POA: Diagnosis present

## 2018-12-23 DIAGNOSIS — E271 Primary adrenocortical insufficiency: Secondary | ICD-10-CM | POA: Diagnosis present

## 2018-12-23 DIAGNOSIS — Z888 Allergy status to other drugs, medicaments and biological substances status: Secondary | ICD-10-CM

## 2018-12-23 DIAGNOSIS — E039 Hypothyroidism, unspecified: Secondary | ICD-10-CM | POA: Diagnosis present

## 2018-12-23 DIAGNOSIS — Z85850 Personal history of malignant neoplasm of thyroid: Secondary | ICD-10-CM | POA: Diagnosis not present

## 2018-12-23 DIAGNOSIS — X58XXXA Exposure to other specified factors, initial encounter: Secondary | ICD-10-CM | POA: Diagnosis not present

## 2018-12-23 DIAGNOSIS — Z9882 Breast implant status: Secondary | ICD-10-CM

## 2018-12-23 DIAGNOSIS — C73 Malignant neoplasm of thyroid gland: Secondary | ICD-10-CM

## 2018-12-23 DIAGNOSIS — Z86000 Personal history of in-situ neoplasm of breast: Secondary | ICD-10-CM | POA: Diagnosis present

## 2018-12-23 DIAGNOSIS — Z853 Personal history of malignant neoplasm of breast: Secondary | ICD-10-CM | POA: Diagnosis not present

## 2018-12-23 DIAGNOSIS — W230XXA Caught, crushed, jammed, or pinched between moving objects, initial encounter: Secondary | ICD-10-CM | POA: Diagnosis present

## 2018-12-23 DIAGNOSIS — Z7951 Long term (current) use of inhaled steroids: Secondary | ICD-10-CM | POA: Diagnosis not present

## 2018-12-23 DIAGNOSIS — I7300 Raynaud's syndrome without gangrene: Secondary | ICD-10-CM | POA: Diagnosis present

## 2018-12-23 DIAGNOSIS — Z8543 Personal history of malignant neoplasm of ovary: Secondary | ICD-10-CM

## 2018-12-23 DIAGNOSIS — J9611 Chronic respiratory failure with hypoxia: Secondary | ICD-10-CM | POA: Diagnosis present

## 2018-12-23 DIAGNOSIS — I350 Nonrheumatic aortic (valve) stenosis: Secondary | ICD-10-CM | POA: Diagnosis present

## 2018-12-23 DIAGNOSIS — I428 Other cardiomyopathies: Secondary | ICD-10-CM | POA: Diagnosis present

## 2018-12-23 DIAGNOSIS — Z90710 Acquired absence of both cervix and uterus: Secondary | ICD-10-CM | POA: Diagnosis not present

## 2018-12-23 DIAGNOSIS — I5082 Biventricular heart failure: Secondary | ICD-10-CM | POA: Diagnosis present

## 2018-12-23 DIAGNOSIS — E785 Hyperlipidemia, unspecified: Secondary | ICD-10-CM | POA: Diagnosis present

## 2018-12-23 DIAGNOSIS — S6992XD Unspecified injury of left wrist, hand and finger(s), subsequent encounter: Secondary | ICD-10-CM | POA: Diagnosis not present

## 2018-12-23 DIAGNOSIS — I110 Hypertensive heart disease with heart failure: Secondary | ICD-10-CM | POA: Diagnosis present

## 2018-12-23 DIAGNOSIS — E2740 Unspecified adrenocortical insufficiency: Secondary | ICD-10-CM | POA: Diagnosis present

## 2018-12-23 DIAGNOSIS — Z953 Presence of xenogenic heart valve: Secondary | ICD-10-CM | POA: Diagnosis not present

## 2018-12-23 DIAGNOSIS — Z885 Allergy status to narcotic agent status: Secondary | ICD-10-CM | POA: Diagnosis not present

## 2018-12-23 DIAGNOSIS — Z9981 Dependence on supplemental oxygen: Secondary | ICD-10-CM | POA: Diagnosis not present

## 2018-12-23 DIAGNOSIS — G4700 Insomnia, unspecified: Secondary | ICD-10-CM | POA: Diagnosis present

## 2018-12-23 DIAGNOSIS — C8190 Hodgkin lymphoma, unspecified, unspecified site: Secondary | ICD-10-CM

## 2018-12-23 DIAGNOSIS — E7800 Pure hypercholesterolemia, unspecified: Secondary | ICD-10-CM

## 2018-12-23 DIAGNOSIS — Z8541 Personal history of malignant neoplasm of cervix uteri: Secondary | ICD-10-CM

## 2018-12-23 DIAGNOSIS — Z79899 Other long term (current) drug therapy: Secondary | ICD-10-CM

## 2018-12-23 DIAGNOSIS — C8100 Nodular lymphocyte predominant Hodgkin lymphoma, unspecified site: Secondary | ICD-10-CM | POA: Diagnosis not present

## 2018-12-23 DIAGNOSIS — D352 Benign neoplasm of pituitary gland: Secondary | ICD-10-CM

## 2018-12-23 DIAGNOSIS — C539 Malignant neoplasm of cervix uteri, unspecified: Secondary | ICD-10-CM

## 2018-12-23 DIAGNOSIS — Z79890 Hormone replacement therapy (postmenopausal): Secondary | ICD-10-CM | POA: Diagnosis not present

## 2018-12-23 DIAGNOSIS — S67191A Crushing injury of left index finger, initial encounter: Principal | ICD-10-CM | POA: Diagnosis present

## 2018-12-23 DIAGNOSIS — G43909 Migraine, unspecified, not intractable, without status migrainosus: Secondary | ICD-10-CM | POA: Diagnosis present

## 2018-12-23 DIAGNOSIS — I2729 Other secondary pulmonary hypertension: Secondary | ICD-10-CM | POA: Diagnosis present

## 2018-12-23 DIAGNOSIS — Z87442 Personal history of urinary calculi: Secondary | ICD-10-CM

## 2018-12-23 DIAGNOSIS — Z95828 Presence of other vascular implants and grafts: Secondary | ICD-10-CM | POA: Diagnosis not present

## 2018-12-23 DIAGNOSIS — R918 Other nonspecific abnormal finding of lung field: Secondary | ICD-10-CM | POA: Diagnosis present

## 2018-12-23 DIAGNOSIS — M726 Necrotizing fasciitis: Secondary | ICD-10-CM

## 2018-12-23 DIAGNOSIS — N200 Calculus of kidney: Secondary | ICD-10-CM

## 2018-12-23 DIAGNOSIS — Z79891 Long term (current) use of opiate analgesic: Secondary | ICD-10-CM | POA: Diagnosis not present

## 2018-12-23 DIAGNOSIS — C50919 Malignant neoplasm of unspecified site of unspecified female breast: Secondary | ICD-10-CM

## 2018-12-23 HISTORY — DX: Pulmonary hypertension, unspecified: I27.20

## 2018-12-23 HISTORY — DX: Chronic respiratory failure with hypoxia: J96.11

## 2018-12-23 HISTORY — DX: Malignant neoplasm of cervix uteri, unspecified: C53.9

## 2018-12-23 HISTORY — DX: Necrotizing fasciitis: M72.6

## 2018-12-23 HISTORY — DX: Benign neoplasm of pituitary gland: D35.2

## 2018-12-23 HISTORY — PX: I&D EXTREMITY: SHX5045

## 2018-12-23 HISTORY — DX: Hypoxemia: R09.02

## 2018-12-23 LAB — LACTIC ACID, PLASMA: Lactic Acid, Venous: 1.8 mmol/L (ref 0.5–1.9)

## 2018-12-23 LAB — BASIC METABOLIC PANEL
Anion gap: 7 (ref 5–15)
BUN: 12 mg/dL (ref 6–20)
CO2: 21 mmol/L — ABNORMAL LOW (ref 22–32)
Calcium: 8.5 mg/dL — ABNORMAL LOW (ref 8.9–10.3)
Chloride: 108 mmol/L (ref 98–111)
Creatinine, Ser: 1.03 mg/dL — ABNORMAL HIGH (ref 0.44–1.00)
GFR calc Af Amer: >60 mL/min (ref >60)
GFR calc non Af Amer: >60 mL/min (ref >60)
GLUCOSE: 149 mg/dL — AB (ref 70–99)
Potassium: 3.2 mmol/L — ABNORMAL LOW (ref 3.5–5.1)
Sodium: 136 mmol/L (ref 135–145)

## 2018-12-23 LAB — CBC
HCT: 38.1 % (ref 36.0–46.0)
Hemoglobin: 11.8 g/dL — ABNORMAL LOW (ref 12.0–15.0)
MCH: 28 pg (ref 26.0–34.0)
MCHC: 31 g/dL (ref 30.0–36.0)
MCV: 90.5 fL (ref 80.0–100.0)
Platelets: 256 10*3/uL (ref 150–400)
RBC: 4.21 MIL/uL (ref 3.87–5.11)
RDW: 13.8 % (ref 11.5–15.5)
WBC: 8.9 10*3/uL (ref 4.0–10.5)
nRBC: 0 % (ref 0.0–0.2)

## 2018-12-23 LAB — FUNGUS CULTURE WITH STAIN

## 2018-12-23 LAB — MAGNESIUM: Magnesium: 1.9 mg/dL (ref 1.7–2.4)

## 2018-12-23 LAB — FUNGAL ORGANISM REFLEX

## 2018-12-23 LAB — FUNGUS CULTURE RESULT

## 2018-12-23 SURGERY — IRRIGATION AND DEBRIDEMENT EXTREMITY
Anesthesia: General | Site: Finger | Laterality: Left

## 2018-12-23 MED ORDER — HYDROCORTISONE 10 MG PO TABS: 10.0000 mg | ORAL_TABLET | Freq: Every day | ORAL | Status: DC

## 2018-12-23 MED ORDER — SENNOSIDES-DOCUSATE SODIUM 8.6-50 MG PO TABS
1.0000 | ORAL_TABLET | Freq: Two times a day (BID) | ORAL | Status: DC
  Filled 2018-12-23 (×3): qty 1

## 2018-12-23 MED ORDER — PROPOFOL 10 MG/ML IV BOLUS
INTRAVENOUS | Status: AC
  Filled 2018-12-23: qty 20

## 2018-12-23 MED ORDER — FUROSEMIDE 20 MG PO TABS
20.0000 mg | ORAL_TABLET | Freq: Two times a day (BID) | ORAL | Status: DC
  Administered 2018-12-23 – 2018-12-25 (×3): 20 mg via ORAL
  Filled 2018-12-23 (×4): qty 1

## 2018-12-23 MED ORDER — METHOCARBAMOL 500 MG PO TABS
500.0000 mg | ORAL_TABLET | Freq: Four times a day (QID) | ORAL | Status: DC | PRN
  Administered 2018-12-24: 500 mg via ORAL
  Filled 2018-12-23: qty 1

## 2018-12-23 MED ORDER — MIDAZOLAM HCL 2 MG/2ML IJ SOLN
INTRAMUSCULAR | Status: AC
  Filled 2018-12-23: qty 2

## 2018-12-23 MED ORDER — MIDAZOLAM HCL 5 MG/5ML IJ SOLN
INTRAMUSCULAR | Status: DC | PRN
  Administered 2018-12-23: 2 mg via INTRAVENOUS

## 2018-12-23 MED ORDER — SUCRALFATE 1 G PO TABS: 1.0000 g | ORAL_TABLET | ORAL | Status: DC | PRN

## 2018-12-23 MED ORDER — DIPHENHYDRAMINE HCL 50 MG/ML IJ SOLN
INTRAMUSCULAR | Status: AC
  Administered 2018-12-23: 12.5 mg via INTRAVENOUS
  Filled 2018-12-23: qty 1

## 2018-12-23 MED ORDER — HYDROCORTISONE 20 MG PO TABS
20.0000 mg | ORAL_TABLET | Freq: Every day | ORAL | Status: DC
  Administered 2018-12-24 – 2018-12-25 (×2): 20 mg via ORAL
  Filled 2018-12-23 (×2): qty 1

## 2018-12-23 MED ORDER — SODIUM CHLORIDE 0.9 % IR SOLN
Status: DC | PRN
  Administered 2018-12-23: 3000 mL

## 2018-12-23 MED ORDER — HYDROCORTISONE 5 MG PO TABS
5.0000 mg | ORAL_TABLET | Freq: Every evening | ORAL | Status: DC
  Filled 2018-12-23: qty 1

## 2018-12-23 MED ORDER — DEXAMETHASONE SODIUM PHOSPHATE 10 MG/ML IJ SOLN
INTRAMUSCULAR | Status: AC
  Filled 2018-12-23: qty 1

## 2018-12-23 MED ORDER — DIPHENHYDRAMINE HCL 50 MG/ML IJ SOLN
INTRAMUSCULAR | Status: AC
  Filled 2018-12-23: qty 1

## 2018-12-23 MED ORDER — SODIUM CHLORIDE 0.9 % IV SOLN
INTRAVENOUS | Status: DC
  Administered 2018-12-23: 18:00:00 via INTRAVENOUS

## 2018-12-23 MED ORDER — DICLOFENAC SODIUM 50 MG PO TBEC
50.0000 mg | DELAYED_RELEASE_TABLET | Freq: Three times a day (TID) | ORAL | Status: DC | PRN
  Filled 2018-12-23: qty 1

## 2018-12-23 MED ORDER — HYDROMORPHONE HCL 1 MG/ML IJ SOLN
1.0000 mg | INTRAMUSCULAR | Status: DC | PRN
  Administered 2018-12-23 – 2018-12-25 (×13): 1 mg via INTRAVENOUS
  Filled 2018-12-23 (×13): qty 1

## 2018-12-23 MED ORDER — DEXLANSOPRAZOLE 60 MG PO CPDR
60.0000 mg | DELAYED_RELEASE_CAPSULE | Freq: Every day | ORAL | Status: DC
  Administered 2018-12-24 – 2018-12-25 (×2): 60 mg via ORAL

## 2018-12-23 MED ORDER — VANCOMYCIN HCL 500 MG IV SOLR
500.0000 mg | Freq: Once | INTRAVENOUS | Status: AC
  Administered 2018-12-23: 500 mg via INTRAVENOUS
  Filled 2018-12-23: qty 500

## 2018-12-23 MED ORDER — POTASSIUM CHLORIDE CRYS ER 10 MEQ PO TBCR
10.0000 meq | EXTENDED_RELEASE_TABLET | Freq: Every day | ORAL | Status: DC
  Administered 2018-12-24 – 2018-12-25 (×2): 10 meq via ORAL
  Filled 2018-12-23 (×3): qty 1

## 2018-12-23 MED ORDER — HEPARIN SODIUM (PORCINE) 5000 UNIT/ML IJ SOLN
5000.0000 [IU] | Freq: Three times a day (TID) | INTRAMUSCULAR | Status: DC
  Administered 2018-12-25: 5000 [IU] via SUBCUTANEOUS
  Filled 2018-12-23 (×2): qty 1

## 2018-12-23 MED ORDER — SODIUM CHLORIDE 0.9% FLUSH
10.0000 mL | Freq: Two times a day (BID) | INTRAVENOUS | Status: DC
  Administered 2018-12-24 – 2018-12-25 (×3): 10 mL

## 2018-12-23 MED ORDER — TRAMADOL HCL 50 MG PO TABS
100.0000 mg | ORAL_TABLET | Freq: Four times a day (QID) | ORAL | Status: DC
  Administered 2018-12-25: 100 mg via ORAL
  Filled 2018-12-23: qty 2

## 2018-12-23 MED ORDER — LEVOTHYROXINE SODIUM 112 MCG PO TABS
112.0000 ug | ORAL_TABLET | Freq: Every day | ORAL | Status: DC
  Administered 2018-12-24 – 2018-12-25 (×2): 112 ug via ORAL

## 2018-12-23 MED ORDER — LIDOCAINE 2% (20 MG/ML) 5 ML SYRINGE
INTRAMUSCULAR | Status: DC | PRN
  Administered 2018-12-23: 60 mg via INTRAVENOUS

## 2018-12-23 MED ORDER — ASPIRIN EC 81 MG PO TBEC
81.0000 mg | DELAYED_RELEASE_TABLET | Freq: Every day | ORAL | Status: DC
  Administered 2018-12-23 – 2018-12-25 (×3): 81 mg via ORAL
  Filled 2018-12-23 (×3): qty 1

## 2018-12-23 MED ORDER — HYDROCORTISONE 5 MG PO TABS: 5.0000 mg | ORAL_TABLET | ORAL | Status: DC

## 2018-12-23 MED ORDER — SUGAMMADEX SODIUM 500 MG/5ML IV SOLN
INTRAVENOUS | Status: AC
  Filled 2018-12-23: qty 5

## 2018-12-23 MED ORDER — ZOLPIDEM TARTRATE 5 MG PO TABS: 10.0000 mg | ORAL_TABLET | Freq: Every day | ORAL | Status: DC

## 2018-12-23 MED ORDER — DICLOFENAC POTASSIUM(MIGRAINE) 50 MG PO PACK: 50.0000 mg | PACK | Freq: Three times a day (TID) | ORAL | Status: DC | PRN

## 2018-12-23 MED ORDER — VANCOMYCIN HCL 10 G IV SOLR
1500.0000 mg | INTRAVENOUS | Status: DC
  Filled 2018-12-23: qty 1500

## 2018-12-23 MED ORDER — SCOPOLAMINE 1 MG/3DAYS TD PT72
MEDICATED_PATCH | TRANSDERMAL | Status: AC
  Administered 2018-12-23: 1.5 mg via TRANSDERMAL
  Filled 2018-12-23: qty 1

## 2018-12-23 MED ORDER — NON FORMULARY: 60.0000 mg | Freq: Every day | Status: DC

## 2018-12-23 MED ORDER — SCOPOLAMINE 1 MG/3DAYS TD PT72
1.0000 | MEDICATED_PATCH | TRANSDERMAL | Status: DC
  Administered 2018-12-23: 1.5 mg via TRANSDERMAL

## 2018-12-23 MED ORDER — ROSUVASTATIN CALCIUM 5 MG PO TABS
10.0000 mg | ORAL_TABLET | Freq: Every day | ORAL | Status: DC
  Administered 2018-12-24 – 2018-12-25 (×2): 10 mg via ORAL
  Filled 2018-12-23 (×2): qty 2

## 2018-12-23 MED ORDER — CLONAZEPAM 1 MG PO TABS
1.0000 mg | ORAL_TABLET | Freq: Three times a day (TID) | ORAL | Status: DC
  Administered 2018-12-23 – 2018-12-25 (×4): 1 mg via ORAL
  Filled 2018-12-23 (×4): qty 1

## 2018-12-23 MED ORDER — FENTANYL CITRATE (PF) 100 MCG/2ML IJ SOLN
INTRAMUSCULAR | Status: AC
  Administered 2018-12-23: 25 ug via INTRAVENOUS
  Filled 2018-12-23: qty 2

## 2018-12-23 MED ORDER — SODIUM CHLORIDE 0.9% FLUSH: 10.0000 mL | INTRAVENOUS | Status: DC | PRN

## 2018-12-23 MED ORDER — PROPOFOL 10 MG/ML IV BOLUS
INTRAVENOUS | Status: DC | PRN
  Administered 2018-12-23: 150 mg via INTRAVENOUS

## 2018-12-23 MED ORDER — ESTRADIOL 2 MG PO TABS
2.0000 mg | ORAL_TABLET | Freq: Every day | ORAL | Status: DC
  Administered 2018-12-23 – 2018-12-25 (×3): 2 mg via ORAL
  Filled 2018-12-23 (×3): qty 1

## 2018-12-23 MED ORDER — SUGAMMADEX SODIUM 200 MG/2ML IV SOLN
INTRAVENOUS | Status: DC | PRN
  Administered 2018-12-23: 500 mg via INTRAVENOUS

## 2018-12-23 MED ORDER — TOPIRAMATE 25 MG PO TABS
150.0000 mg | ORAL_TABLET | Freq: Every day | ORAL | Status: DC
  Administered 2018-12-23 – 2018-12-24 (×2): 150 mg via ORAL
  Filled 2018-12-23 (×2): qty 2

## 2018-12-23 MED ORDER — LACTATED RINGERS IV SOLN
INTRAVENOUS | Status: DC
  Administered 2018-12-23: 14:00:00 via INTRAVENOUS

## 2018-12-23 MED ORDER — DIPHENHYDRAMINE HCL 50 MG/ML IJ SOLN
50.0000 mg | Freq: Four times a day (QID) | INTRAMUSCULAR | Status: DC | PRN
  Administered 2018-12-24 – 2018-12-25 (×7): 50 mg via INTRAVENOUS
  Filled 2018-12-23 (×8): qty 1

## 2018-12-23 MED ORDER — NON FORMULARY: 12.5000 mg | Freq: Every day | Status: DC

## 2018-12-23 MED ORDER — ROCURONIUM BROMIDE 10 MG/ML (PF) SYRINGE
PREFILLED_SYRINGE | INTRAVENOUS | Status: DC | PRN
  Administered 2018-12-23: 80 mg via INTRAVENOUS

## 2018-12-23 MED ORDER — FENTANYL CITRATE (PF) 250 MCG/5ML IJ SOLN
INTRAMUSCULAR | Status: AC
  Filled 2018-12-23: qty 5

## 2018-12-23 MED ORDER — HYDROMORPHONE HCL 2 MG PO TABS: 4.0000 mg | ORAL_TABLET | ORAL | Status: DC | PRN

## 2018-12-23 MED ORDER — DIPHENHYDRAMINE HCL 50 MG/ML IJ SOLN
INTRAMUSCULAR | Status: DC | PRN
  Administered 2018-12-23: 12.5 mg via INTRAVENOUS

## 2018-12-23 MED ORDER — METOPROLOL SUCCINATE ER 25 MG PO TB24
37.5000 mg | ORAL_TABLET | Freq: Every day | ORAL | Status: DC
  Administered 2018-12-24 – 2018-12-25 (×2): 37.5 mg via ORAL
  Filled 2018-12-23 (×2): qty 1

## 2018-12-23 MED ORDER — HEPARIN SODIUM (PORCINE) 5000 UNIT/ML IJ SOLN: 5000.0000 [IU] | Freq: Three times a day (TID) | INTRAMUSCULAR | Status: DC

## 2018-12-23 MED ORDER — NON FORMULARY: 80.0000 ug | Freq: Two times a day (BID) | Status: DC

## 2018-12-23 MED ORDER — METHOCARBAMOL 1000 MG/10ML IJ SOLN
500.0000 mg | Freq: Four times a day (QID) | INTRAVENOUS | Status: DC | PRN
  Filled 2018-12-23: qty 5

## 2018-12-23 MED ORDER — BUPIVACAINE HCL (PF) 0.25 % IJ SOLN
INTRAMUSCULAR | Status: DC | PRN
  Administered 2018-12-23: 6 mL

## 2018-12-23 MED ORDER — GI COCKTAIL ~~LOC~~
30.0000 mL | Freq: Every day | ORAL | Status: DC | PRN
  Filled 2018-12-23: qty 30

## 2018-12-23 MED ORDER — BUDESONIDE 0.25 MG/2ML IN SUSP
0.2500 mg | Freq: Two times a day (BID) | RESPIRATORY_TRACT | Status: DC
  Administered 2018-12-24: 0.25 mg via RESPIRATORY_TRACT
  Filled 2018-12-23 (×4): qty 2

## 2018-12-23 MED ORDER — PROPOFOL 500 MG/50ML IV EMUL
INTRAVENOUS | Status: DC | PRN
  Administered 2018-12-23: 150 ug/kg/min via INTRAVENOUS

## 2018-12-23 MED ORDER — BUPIVACAINE HCL (PF) 0.25 % IJ SOLN
INTRAMUSCULAR | Status: AC
  Filled 2018-12-23: qty 30

## 2018-12-23 MED ORDER — FLUCONAZOLE 150 MG PO TABS
150.0000 mg | ORAL_TABLET | Freq: Every day | ORAL | Status: DC
  Administered 2018-12-23 – 2018-12-25 (×3): 150 mg via ORAL
  Filled 2018-12-23 (×3): qty 1

## 2018-12-23 MED ORDER — ALBUTEROL SULFATE (2.5 MG/3ML) 0.083% IN NEBU: 3.0000 mL | INHALATION_SOLUTION | Freq: Four times a day (QID) | RESPIRATORY_TRACT | Status: DC | PRN

## 2018-12-23 MED ORDER — 0.9 % SODIUM CHLORIDE (POUR BTL) OPTIME
TOPICAL | Status: DC | PRN
  Administered 2018-12-23: 1000 mL

## 2018-12-23 MED ORDER — FENTANYL CITRATE (PF) 100 MCG/2ML IJ SOLN
25.0000 ug | Freq: Once | INTRAMUSCULAR | Status: AC
  Administered 2018-12-23: 25 ug via INTRAVENOUS

## 2018-12-23 MED ORDER — DEXAMETHASONE SODIUM PHOSPHATE 10 MG/ML IJ SOLN
INTRAMUSCULAR | Status: DC | PRN
  Administered 2018-12-23: 4 mg via INTRAVENOUS

## 2018-12-23 MED ORDER — ZOLPIDEM TARTRATE ER 12.5 MG PO TBCR
12.5000 mg | EXTENDED_RELEASE_TABLET | Freq: Every day | ORAL | Status: AC
  Administered 2018-12-23: 12.5 mg via ORAL

## 2018-12-23 MED ORDER — PIPERACILLIN-TAZOBACTAM 3.375 G IVPB 30 MIN
3.3750 g | INTRAVENOUS | Status: AC
  Administered 2018-12-23: 3.375 g via INTRAVENOUS
  Filled 2018-12-23: qty 50

## 2018-12-23 MED ORDER — FLUTICASONE FUROATE-VILANTEROL 200-25 MCG/INH IN AEPB
1.0000 | INHALATION_SPRAY | Freq: Every day | RESPIRATORY_TRACT | Status: DC
  Filled 2018-12-23: qty 28

## 2018-12-23 MED ORDER — VANCOMYCIN HCL 1000 MG IV SOLR
INTRAVENOUS | Status: DC | PRN
  Administered 2018-12-23: 1000 mg via INTRAVENOUS

## 2018-12-23 MED ORDER — LIDOCAINE 2% (20 MG/ML) 5 ML SYRINGE
INTRAMUSCULAR | Status: AC
  Filled 2018-12-23: qty 5

## 2018-12-23 MED ORDER — ESCITALOPRAM OXALATE 20 MG PO TABS
20.0000 mg | ORAL_TABLET | Freq: Every day | ORAL | Status: DC
  Administered 2018-12-23 – 2018-12-25 (×3): 20 mg via ORAL
  Filled 2018-12-23: qty 1
  Filled 2018-12-23 (×2): qty 2
  Filled 2018-12-23 (×2): qty 1
  Filled 2018-12-23: qty 2

## 2018-12-23 MED ORDER — HYDROCORTISONE 10 MG PO TABS
10.0000 mg | ORAL_TABLET | Freq: Every day | ORAL | Status: DC
  Filled 2018-12-23: qty 1

## 2018-12-23 MED ORDER — DIPHENHYDRAMINE HCL 50 MG/ML IJ SOLN
12.5000 mg | Freq: Once | INTRAMUSCULAR | Status: AC
  Administered 2018-12-23: 12.5 mg via INTRAVENOUS

## 2018-12-23 MED ORDER — HYDROCORTISONE 10 MG PO TABS
10.0000 mg | ORAL_TABLET | Freq: Every day | ORAL | Status: DC
  Administered 2018-12-23 – 2018-12-24 (×2): 10 mg via ORAL
  Filled 2018-12-23 (×3): qty 1

## 2018-12-23 MED ORDER — LEVOTHYROXINE SODIUM 112 MCG PO TABS: 112.0000 ug | ORAL_TABLET | Freq: Every day | ORAL | Status: DC

## 2018-12-23 MED ORDER — HYDROCORTISONE 5 MG PO TABS: 5.0000 mg | ORAL_TABLET | Freq: Every day | ORAL | Status: DC

## 2018-12-23 SURGICAL SUPPLY — 52 items
BANDAGE ACE 4X5 VEL STRL LF (GAUZE/BANDAGES/DRESSINGS) ×3 IMPLANT
BANDAGE ELASTIC 3 VELCRO ST LF (GAUZE/BANDAGES/DRESSINGS) ×2 IMPLANT
BANDAGE ELASTIC 4 VELCRO ST LF (GAUZE/BANDAGES/DRESSINGS) ×2 IMPLANT
BNDG CONFORM 2 STRL LF (GAUZE/BANDAGES/DRESSINGS) IMPLANT
BNDG GAUZE ELAST 4 BULKY (GAUZE/BANDAGES/DRESSINGS) ×7 IMPLANT
CORD BIPOLAR FORCEPS 12FT (ELECTRODE) ×2 IMPLANT
CORDS BIPOLAR (ELECTRODE) ×3 IMPLANT
COVER SURGICAL LIGHT HANDLE (MISCELLANEOUS) ×3 IMPLANT
COVER WAND RF STERILE (DRAPES) ×3 IMPLANT
CUFF TOURNIQUET SINGLE 18IN (TOURNIQUET CUFF) ×3 IMPLANT
CUFF TOURNIQUET SINGLE 24IN (TOURNIQUET CUFF) IMPLANT
DRSG ADAPTIC 3X8 NADH LF (GAUZE/BANDAGES/DRESSINGS) ×3 IMPLANT
DRSG EMULSION OIL 3X3 NADH (GAUZE/BANDAGES/DRESSINGS) ×2 IMPLANT
GAUZE SPONGE 4X4 12PLY STRL (GAUZE/BANDAGES/DRESSINGS) ×3 IMPLANT
GAUZE XEROFORM 1X8 LF (GAUZE/BANDAGES/DRESSINGS) ×3 IMPLANT
GLOVE BIOGEL M 8.0 STRL (GLOVE) ×3 IMPLANT
GLOVE SS BIOGEL STRL SZ 8 (GLOVE) ×1 IMPLANT
GLOVE SUPERSENSE BIOGEL SZ 8 (GLOVE) ×2
GOWN STRL REUS W/ TWL LRG LVL3 (GOWN DISPOSABLE) ×1 IMPLANT
GOWN STRL REUS W/ TWL XL LVL3 (GOWN DISPOSABLE) ×2 IMPLANT
GOWN STRL REUS W/TWL LRG LVL3 (GOWN DISPOSABLE) ×3
GOWN STRL REUS W/TWL XL LVL3 (GOWN DISPOSABLE) ×6
KIT BASIN OR (CUSTOM PROCEDURE TRAY) ×3 IMPLANT
KIT TURNOVER KIT B (KITS) ×3 IMPLANT
LOOP VESSEL MAXI BLUE (MISCELLANEOUS) ×3 IMPLANT
MANIFOLD NEPTUNE II (INSTRUMENTS) ×3 IMPLANT
NDL HYPO 25GX1X1/2 BEV (NEEDLE) IMPLANT
NEEDLE 18GX1X1/2 (RX/OR ONLY) (NEEDLE) ×3 IMPLANT
NEEDLE HYPO 25GX1X1/2 BEV (NEEDLE) ×3 IMPLANT
NS IRRIG 1000ML POUR BTL (IV SOLUTION) ×3 IMPLANT
PACK ORTHO EXTREMITY (CUSTOM PROCEDURE TRAY) ×3 IMPLANT
PAD ARMBOARD 7.5X6 YLW CONV (MISCELLANEOUS) ×3 IMPLANT
PAD CAST 3X4 CTTN HI CHSV (CAST SUPPLIES) IMPLANT
PAD CAST 4YDX4 CTTN HI CHSV (CAST SUPPLIES) ×1 IMPLANT
PADDING CAST COTTON 3X4 STRL (CAST SUPPLIES) ×3
PADDING CAST COTTON 4X4 STRL (CAST SUPPLIES) ×3
SCRUB BETADINE 4OZ XXX (MISCELLANEOUS) ×3 IMPLANT
SET CYSTO W/LG BORE CLAMP LF (SET/KITS/TRAYS/PACK) ×3 IMPLANT
SOL PREP POV-IOD 4OZ 10% (MISCELLANEOUS) ×3 IMPLANT
SPLINT FIBERGLASS 3X12 (CAST SUPPLIES) ×2 IMPLANT
SPONGE LAP 4X18 RFD (DISPOSABLE) ×3 IMPLANT
SUT CHROMIC 5 0 P 3 (SUTURE) ×2 IMPLANT
SUT PROLENE 4 0 PS 2 18 (SUTURE) ×2 IMPLANT
SWAB COLLECTION DEVICE MRSA (MISCELLANEOUS) ×4 IMPLANT
SWAB CULTURE ESWAB REG 1ML (MISCELLANEOUS) IMPLANT
SYR CONTROL 10ML LL (SYRINGE) ×2 IMPLANT
TOWEL OR 17X24 6PK STRL BLUE (TOWEL DISPOSABLE) ×3 IMPLANT
TOWEL OR 17X26 10 PK STRL BLUE (TOWEL DISPOSABLE) ×3 IMPLANT
TUBE CONNECTING 12'X1/4 (SUCTIONS) ×1
TUBE CONNECTING 12X1/4 (SUCTIONS) ×2 IMPLANT
WATER STERILE IRR 1000ML POUR (IV SOLUTION) ×3 IMPLANT
YANKAUER SUCT BULB TIP NO VENT (SUCTIONS) ×3 IMPLANT

## 2018-12-23 NOTE — Transfer of Care (Signed)
Immediate Anesthesia Transfer of Care Note  Patient: [name]  Procedure(s) Performed: IRRIGATION AND DEBRIDEMENT HAND / INDEX FINGER (Left Finger)  Patient Location: PACU  Anesthesia Type:General  Level of Consciousness: awake, alert , oriented and patient cooperative  Airway & Oxygen Therapy: Patient Spontanous Breathing and Patient connected to nasal cannula oxygen  Post-op Assessment: Report given to RN, Post -op Vital signs reviewed and stable and Patient moving all extremities  Post vital signs: Reviewed and stable  Last Vitals:  Vitals Value Taken Time  BP 116/73 12/23/2018  4:34 PM  Temp    Pulse 82 12/23/2018  4:39 PM  Resp 24 12/23/2018  4:39 PM  SpO2 94 % 12/23/2018  4:39 PM  Vitals shown include unvalidated device data.  Last Pain:  Vitals:   12/23/18 1450  TempSrc:   PainSc: 7       Patients Stated Pain Goal: 3 (33/35/45 6256)  Complications: No apparent anesthesia complications

## 2018-12-23 NOTE — Progress Notes (Signed)
Pharmacy Antibiotic Note  [name] is a 48 y.o. female admitted on 12/23/2018 with cellulitis of left index finger after crush injury at home.  Did not improve on Keflex and concern for infectious tenosynovitis. S/p surgical exploration; cultures sent. Pharmacy has been consulted for Vancomycin dosing.    Vancomycin 1gm and Zosyn 3.375 gm IV given in OR after cultures taken.   Dr. Amedeo Plenty notes plan to change dressing in 48hrs.  Plan:  Vancomycin 500 mg IV x 1 tonight to complete loading dose.  Vancomycin 1500 mg IV q24hrs to begin on 3/25 pm.  Follow renal function, culture data, clinical progress and antibiotic plans.  Goal AUC 400-550. Expected AUC: 471 SCr used: 1.03   Height: 5' 5" (165.1 cm) Weight: 178 lb (80.7 kg) IBW/kg (Calculated) : 57  Temp (24hrs), Avg:98.2 F (36.8 C), Min:97.6 F (36.4 C), Max:98.7 F (37.1 C)  Recent Labs  Lab 12/23/18 1356  WBC 8.9  CREATININE 1.03*  LATICACIDVEN 1.8    Estimated Creatinine Clearance: 70.9 mL/min (A) (by C-G formula based on SCr of 1.03 mg/dL (H)).    Allergies  Allergen Reactions  . Na Ferric Gluc Cplx In Sucrose Anaphylaxis  . Cymbalta [Duloxetine Hcl] Swelling and Anxiety  . Hydromorphone Other (See Comments)    BP drop and heart rate drops  . Succinylcholine Other (See Comments)    Lock Jaw  . Ativan [Lorazepam] Rash and Other (See Comments)    Dystonia  . Buprenorphine Hcl Hives  . Compazine Other (See Comments)    Altered mental status Aggression  . Metoclopramide Other (See Comments)    Dystonia  . Morphine And Related Hives  . Ondansetron Hives and Rash  . Promethazine Hcl Hives  . Tegaderm Ag Mesh [Silver] Rash    Old formulation only, is able to tolerate new formulation    Antimicrobials this admission:   Vancomycin 3/24>>   Zosyn x 1 on 3/24 in OR  Dose adjustments this admission:   n/a  Microbiology results:   3/24 deep surgical/abscess culture -  Thank you for allowing pharmacy to  be a part of this patient's care.  Arty Baumgartner, [place] Pager: [phone] or phone: [phone] 12/23/2018 6:17 PM

## 2018-12-23 NOTE — Anesthesia Preprocedure Evaluation (Addendum)
Anesthesia Evaluation  Patient identified by MRN, date of birth, ID band Patient awake    Reviewed: Allergy & Precautions, NPO status , Patient's Chart, lab work & pertinent test results  Airway Mallampati: II  TM Distance: >3 FB Neck ROM: Full    Dental  (+) Teeth Intact, Dental Advisory Given   Pulmonary    Pulmonary exam normal        Cardiovascular hypertension, Pt. on home beta blockers +CHF  + Valvular Problems/Murmurs AS  Rhythm:Regular Rate:Normal     Neuro/Psych  Headaches,    GI/Hepatic Neg liver ROS, GERD  ,  Endo/Other  Hypothyroidism   Renal/GU Renal InsufficiencyRenal disease     Musculoskeletal negative musculoskeletal ROS (+)   Abdominal Normal abdominal exam  (+)   Peds  Hematology negative hematology ROS (+)   Anesthesia Other Findings   Reproductive/Obstetrics                            Anesthesia Physical Anesthesia Plan  ASA: II  Anesthesia Plan: General   Post-op Pain Management:    Induction: Intravenous, Rapid sequence and Cricoid pressure planned  PONV Risk Score and Plan: 4 or greater and Ondansetron, Dexamethasone, Midazolam and Scopolamine patch - Pre-op  Airway Management Planned: Oral ETT  Additional Equipment: None  Intra-op Plan:   Post-operative Plan: Extubation in OR  Informed Consent: I have reviewed the patients History and Physical, chart, labs and discussed the procedure including the risks, benefits and alternatives for the proposed anesthesia with the patient or authorized representative who has indicated his/her understanding and acceptance.     Dental advisory given  Plan Discussed with: CRNA  Anesthesia Plan Comments:        Anesthesia Quick Evaluation

## 2018-12-23 NOTE — Anesthesia Postprocedure Evaluation (Signed)
Anesthesia Post Note  Patient: [name]  Procedure(s) Performed: IRRIGATION AND DEBRIDEMENT HAND / INDEX FINGER (Left Finger)     Patient location during evaluation: PACU Anesthesia Type: General Level of consciousness: awake and alert Pain management: pain level controlled Vital Signs Assessment: post-procedure vital signs reviewed and stable Respiratory status: spontaneous breathing, nonlabored ventilation, respiratory function stable and patient connected to nasal cannula oxygen Cardiovascular status: blood pressure returned to baseline and stable Postop Assessment: no apparent nausea or vomiting Anesthetic complications: no    Last Vitals:  Vitals:   12/23/18 1649 12/23/18 1718  BP: 96/71 110/70  Pulse: 78 71  Resp: 15 18  Temp:  36.8 C  SpO2: 94%                  Effie Berkshire

## 2018-12-23 NOTE — ED Triage Notes (Signed)
Pt arrives with reports of hand injury. Dr Veronia Beets at beside to evaluate. OR called and stated they are ready for her

## 2018-12-23 NOTE — Progress Notes (Signed)
Pt arrived to 5N11 via stretcher after surgery. Received report from Twain Harte, RN in PACU. See assessment. Will continue to monitor.

## 2018-12-23 NOTE — Plan of Care (Signed)
Problem: Education: Goal: Knowledge of General Education information will improve Description Including pain rating scale, medication(s)/side effects and non-pharmacologic comfort measures Outcome: Progressing   Problem: Health Behavior/Discharge Planning: Goal: Ability to manage health-related needs will improve Outcome: Progressing   Problem: Clinical Measurements: Goal: Ability to maintain clinical measurements within normal limits will improve Outcome: Progressing Goal: Will remain free from infection Outcome: Progressing Goal: Respiratory complications will improve Outcome: Progressing Goal: Cardiovascular complication will be avoided Outcome: Progressing   Problem: Activity: Goal: Risk for activity intolerance will decrease Outcome: Progressing   Problem: Nutrition: Goal: Adequate nutrition will be maintained Outcome: Progressing   Problem: Coping: Goal: Level of anxiety will decrease Outcome: Progressing   Problem: Elimination: Goal: Will not experience complications related to urinary retention Outcome: Progressing   Problem: Pain Managment: Goal: General experience of comfort will improve Outcome: Progressing   Problem: Safety: Goal: Ability to remain free from injury will improve Outcome: Progressing   Problem: Skin Integrity: Goal: Risk for impaired skin integrity will decrease Outcome: Progressing

## 2018-12-23 NOTE — Progress Notes (Signed)
Patient ID: [name], female   DOB: 27-Aug-1971, 48 y.o.   MRN: [id number] Patient seen and examined at bedside.  She unfortunately is not elevating as instructed.  I discussed her once again the importance of elevation.  We will continue antibiotics and await cultures.  I will plan to change her dressing Thursday.  I went over all issues with the patient at bedside in regards to operative findings.  I do feel that she will likely have some degree of challenge healing the area in question nevertheless we are going to do everything we can to give her a finger that is viable and functional.  Johnanna Bakke MD

## 2018-12-23 NOTE — Anesthesia Procedure Notes (Signed)
Procedure Name: Intubation Date/Time: 12/23/2018 3:38 PM Performed by: Myna Bright, CRNA Pre-anesthesia Checklist: Patient identified, Emergency Drugs available, Suction available and Patient being monitored Patient Re-evaluated:Patient Re-evaluated prior to induction Oxygen Delivery Method: Circle system utilized Preoxygenation: Pre-oxygenation with 100% oxygen Induction Type: IV induction and Rapid sequence Laryngoscope Size: Mac and 3 Grade View: Grade I Tube type: Oral Tube size: 7.0 mm Number of attempts: 1 Airway Equipment and Method: Stylet Placement Confirmation: ETT inserted through vocal cords under direct vision,  positive ETCO2 and breath sounds checked- equal and bilateral Secured at: 21 cm Tube secured with: Tape Dental Injury: Teeth and Oropharynx as per pre-operative assessment

## 2018-12-23 NOTE — Addendum Note (Signed)
Addendum  created 12/23/18 2212 by Myna Bright, CRNA   Intraprocedure Event edited

## 2018-12-23 NOTE — Op Note (Signed)
Operative note  Roseanne Kaufman MD  Preoperative diagnosis: Crushing injury to the left index finger with dorsal blistering and cellulitic change.  Inability to move the finger suspicious for infectious tenosynovitis  Postop diagnosis: Stable extensor and flexor sheaths.  Cellulitic change and blistering dorsally about the finger  Operative procedure #1 volar finger incision with flexor tendon exploration and tenolysis tenosynovectomy with cultures taken.  The tendons look stable and there is no gross infection in the sheath tissue.  #2 dorsal incision and extensor tendon exploration with irrigation debridement of the skin and de-epithelialized tissue.  This was an extensor tendon tenolysis tenosynovectomy and exploration  Surgeon: Roseanne Kaufman MD  Anesthesia General  Indications for procedure this patient states she crushed her finger Sunday night.  She tried to poke the dorsal area multiple times without any relief of the pressure.  She presented to my office Monday replaced on antibiotics and she is failed to improve.  She complains of incapacitating pain and inability to move her finger.  The finger looks cellulitic and due to the inability to move and severe pain we elected to proceed with exploration.  I have taken pictures for the chart.  I discussed with patient all issues.  Operative procedure patient was seen by myself and anesthesia taken to the operative theater and underwent a smooth induction of general anesthetic she was prepped with Hibiclens pre-scrub followed by 10-minute surgical Betadine scrub and paint.  Timeout was observed.  Preoperative antibiotics were given in the form of vancomycin.  We chose a broad-spectrum antibiotic given her findings.  Operation commenced initially with a volar approach overlying the MP and P1 region.  Skin flap was elevated nicely in a modified Bruner fashion the patient had no gross purulence.  I dissected down to the tendon sheath and  opened a window at A3 and between a 2 and a 1.  The patient tolerated this well.  Fluid was minimal at best and there is no infectious issues.  I did culture this to be on the safe side to make sure this was not a evolving infection.  I irrigated with a solution of 1.5 L of saline and then closed the wound with Prolene.  Following this I turned the hand dorsally and debrided the blistered tissue.  Following this we made a small incision about the dorsal aspect of the finger proximal to the blisters.  Dissection was carried down the extensor tendon tenolysis tenosynovectomy and exploration was accomplished.  Once again no gross purulence but I did culture this for aerobic and anaerobic cultures.  The patient tolerated this well.  Once this was complete the patient then underwent irrigation with 1.5 L of saline and loose closure with a chromic stitch over a vessel loop drain.  The patient tolerated this well the finger has refill.  Given her multiple medical problems and difficulty healing I would give her a guarded prognosis.  We will plan for elevation and will change her dressing in 48 hours.  Will await cultures.  Overall this looked more of a burn injury then a crushing injury.  There is no gross purulence however we will await cultures.  All questions have been addressed.  She tolerated the procedure well.  I did call her family who did not answer the phone but left a message  Shauntavia Brackin MD

## 2018-12-23 NOTE — Consult Note (Signed)
Reason for Consult:Left index finger infection with history of crush injury Referring Physician: ER staff  [name] is an 48 y.o. female.  HPI: Patient is a 48 year old female with multiple medical problems as described below in the chart.  She has been admitted previously in February 2020 for multiple issues.  Sunday night (2 days ago) she hit her left index finger in a bathroom door rather violently.  She had immediate blistering to the finger and subsequently tried to pop this with 3 or so attempts with a needle at home.  She began having more swelling and pain and was ultimately seen yesterday and placed on antibiotics in the form of Keflex.  She has not responded and has findings which are concerning for an infectious tenosynovitis.  She has difficulty moving the finger and difficulty with pain.  She cannot move the finger it is red swollen and somewhat tense.  She has dorsal blistering along a 2-1/2 x 2 cm area.  There is some dusky look to the central area over the PIP joint.   Given her multiple medical problems we have asked for hospitalist admission.  I discussed this with Dr. Sherral Hammers who was nice enough to see her.  Past Medical History:  Diagnosis Date  . Addison's disease ([place])   . Adrenal insufficiency ([place])   . Aortic stenosis   . Aortic stenosis   . Appendicitis 12/19/09  . Breast cancer ([place])    STATUS POST BILATERAL MASTECTOMY. STATUS POST RECONSTRUCTION. SHE HAD SILICONE BREAST IMPLANTS AND THE LEFT IMPLANT IS LEAKING SLIGHTLY  . Chest pain   . 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 GERD (gastroesophageal reflux disease)   . Hodgkin lymphoma ([facility])    STATUS POST MANTLE RADIATION  . Hypertension   . Osteoporosis   . Palpitations   . Raynaud phenomenon   . SVT (supraventricular tachycardia) ([place])   . Tachycardia   . Thyroid cancer ([place])    STATUS POST SURGICAL REMOVAL-CURRENT ON THYROID REPLACEMENT    Past Surgical History:  Procedure Laterality  Date  . ABDOMINAL HYSTERECTOMY    . APPENDECTOMY    . CARDIAC CATHETERIZATION  05/18/09   NORMAL CATH  . PITUITARY SURGERY    . RIGHT/LEFT HEART CATH AND CORONARY ANGIOGRAPHY N/A 04/02/2018   Procedure: RIGHT/LEFT HEART CATH AND CORONARY ANGIOGRAPHY;  Surgeon: Burnell Blanks, MD;  Location: [facility];  Service: Cardiovascular;  Laterality: N/A;  . VIDEO BRONCHOSCOPY Bilateral 11/14/2018   Procedure: VIDEO BRONCHOSCOPY WITHOUT FLUORO;  Surgeon: Margaretha Seeds, MD;  Location: [facility];  Service: Cardiopulmonary;  Laterality: Bilateral;  . VIDEO BRONCHOSCOPY WITH ENDOBRONCHIAL ULTRASOUND N/A 11/19/2018   Procedure: VIDEO BRONCHOSCOPY WITH ENDOBRONCHIAL ULTRASOUND;  Surgeon: Margaretha Seeds, MD;  Location: [facility];  Service: Thoracic;  Laterality: N/A;    Family History  Family history unknown: Yes    Social History:  reports that she has never smoked. She has never used smokeless tobacco. She reports current alcohol use. She reports that she does not use drugs.  Allergies:  Allergies  Allergen Reactions  . Cymbalta [Duloxetine Hcl] Anxiety  . Hydromorphone Other (See Comments)    BP drop and heart rate drops  . Buprenorphine Hcl Hives  . Compazine     unknown  . Metoclopramide Other (See Comments)    contractures  . Morphine And Related Hives  . Na Ferric Gluc Cplx In Sucrose Other (See Comments)    unknown  . Ondansetron Rash  [place]  . Promethazine Hcl Hives  . Tegaderm Ag Mesh [Silver] Rash    Old formulation only, is able to tolerate new formulation    Medications: I have reviewed the patient's current medications.  No results found for this or any previous visit (from the past 48 hour(s)).  No results found.  ROS There were no vitals taken for this visit. Physical Exam Left index finger has swelling.  She will not move the finger.  She has a 2 x 2 and half centimeter area dorsally with blistering.  She has a poor look to the skin architecture  and alignment of tense erythema volarly.  X-rays are negative.  We performed x-rays at my office.  She is on home O2.  She is not particularly short of breath at the moment.  Abdomen is nontender.  She did eat breakfast this morning.  Lower extremity examination is without signs of DVT.  She does have history of excoriations throughout the arms which are slow to heal and have never been definitively diagnosed despite biopsy and other measures. There is no signs of instability or infection in the forearm or elbow. Assessment/Plan: Status post crush injury left index finger with history of multiple medical problems and a recent self puncture of the affected area with worsening.  Due to these issues I would recommend irrigation and debridement and operative look.  I discussed with the patient risk and benefit profile of this approach.  Given the fact that she is 48 hours into the process and has findings consistent with infectious tenosynovitis I feel that an operative decompression of her sheath is warranted in hopes to try to save the finger.  Given the ulceration/blistering and other issues I discussed with her the real possibility of digit loss.  At present time our attempt will be a surgical irrigation debridement with IV antibiotics.  I reviewed these issues with her at length and the findings.  Medicine is nice enough to admit her.  We are planning surgery for your upper extremity. The risk and benefits of surgery to include risk of bleeding, infection, anesthesia,  damage to normal structures and failure of the surgery to accomplish its intended goals of relieving symptoms and restoring function have been discussed in detail. With this in mind we plan to proceed. I have specifically discussed with the patient the pre-and postoperative regime and the dos and don'ts and risk and benefits in great detail. Risk and benefits of surgery also include risk of dystrophy(CRPS), chronic nerve  pain, failure of the healing process to go onto completion and other inherent risks of surgery The relavent the pathophysiology of the disease/injury process, as well as the alternatives for treatment and postoperative course of action has been discussed in great detail with the patient who desires to proceed.  We will do everything in our power to help you (the patient) restore function to the upper extremity. It is a pleasure to see this patient today.   Satira Anis Supriya Beaston III 12/23/2018, 12:21 PM

## 2018-12-23 NOTE — H&P (Signed)
[facility] History and Physical  [name] KGU:[id number] DOB: [birth year] DOA: 12/23/2018  Referring physician: Dr. Mabeline Caras [facility] PCP: Elon Spanner Antionette Poles, MD   Chief Complaint:   HPI: [name] is a 48 y.o.  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), pituitary adenoma s/p resection, thyroid cancer, cervical cancer s/p LEEP procedure, ovarian cancer s/p hysterectomy, chronic Adrenal insufficiency, Hypertension, Hyperlipidemia, Chronic Systolic CHF with (Right heart failure )?, Nephrolithiasis,fibroelastosis of lung with PHTN, Chronic Respiratory failure with hypoxia on 3-4 L O2 at home, lung nodules, kidney nodules, liver nodules, brain nodule believed to be secondary to treatment for her breast cancer being monitored by [facility], migraine,  Patient recently discharged on 11/20/2018 for RIGHT third finger ulceration with cellulitis s/p surgery by Dr. Roseanne Kaufman orthopedic hand surgery, LUL pneumonia with acute on chronic respiratory failure with concern for focal alveolar hemorrhage secondary to vasculitis vs pneumonic process with small volume hemoptysis.  Patient was seen by ID and pulmonology and placed on antibiotics and antivirals.  During her stay s/p bronchoscopy unrevealing.  Followed by pulmonology at [facility].  Also during stay patient subacute Hx blisters developing and bilateral hand and arm swelling over last 3 months.  Autoimmune work-up unrevealing except for nonspecific elevated sedimentation rate and rheumatoid factor.  Was supposed to follow-up with rheumatology.  Sunday night (2 days ago) she hit her left index finger in a bathroom door rather violently.  She had immediate blistering to the finger and subsequently tried to pop this with 3 or so attempts with a needle at home.   She began having more swelling and pain and was ultimately seen  yesterday and placed on antibiotics in the form of Keflex.  She has not responded and has findings which are concerning for an infectious tenosynovitis.   She has difficulty moving the finger and difficulty with pain.   She cannot move the finger it is red swollen and somewhat tense.  She has dorsal blistering along a 2-1/2 x 2 cm area.  There is some dusky look to the central area over the PIP joint. In-depth discussion with patient who is very knowledgeable about all of her extensive medical conditions.  Patient concerned about staying on multiple namebrand medications as she has had difficulty in the past when transition to alternate medications while hospitalized.  Has therefore brought her own medications from home were appropriate.  In addition patient has extensive allergies.     Review of Systems:  Constitutional:  No weight loss, night sweats, Fevers, chills, fatigue.  HEENT:  No headaches, Difficulty swallowing,Tooth/dental problems,Sore throat,  No sneezing, itching, ear ache, nasal congestion, post nasal drip,  Cardio-vascular:  No chest pain, Orthopnea, PND, swelling in lower extremities, anasarca, dizziness, palpitations  GI:  No heartburn, indigestion, abdominal pain, nausea, vomiting, diarrhea, change in bowel habits, loss of appetite  Resp:  Positive chronic shortness of breath with exertion or at rest. No excess mucus, no productive cough, No non-productive cough, No coughing up of blood.No change in color of mucus.No wheezing.No chest wall deformity  Skin:  no rash or lesions.  GU:  no dysuria, change in color of urine, no urgency or frequency. No flank pain.  Musculoskeletal:  Positive LEFT second metacarpal joint pain and swelling. Positive decreased range of motion. No back pain.  Psych:  No change in mood or affect. No depression or anxiety. No memory loss.  Past Medical History:  Diagnosis Date  . Addison's disease ([place])   . Adrenal insufficiency ([place])   .  Aortic stenosis   . Aortic stenosis   . Appendicitis 12/19/09  . Breast cancer ([place])    STATUS POST BILATERAL MASTECTOMY. STATUS POST RECONSTRUCTION. SHE HAD SILICONE BREAST IMPLANTS AND THE LEFT IMPLANT IS LEAKING SLIGHTLY  . Cervical cancer ([place]) 12/23/2018  . Chest pain   . 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 Chronic respiratory failure with hypoxia ([place]) 12/23/2018  . GERD (gastroesophageal reflux disease)   . Hodgkin lymphoma (HCC)    STATUS POST MANTLE RADIATION  . Hypertension   . Osteoporosis   . Palpitations   . Pituitary adenoma ([place]) 12/23/2018  . Pulmonary hypertension ([place]) 12/23/2018  . Raynaud phenomenon   . SVT (supraventricular tachycardia) ([place])   . Tachycardia   . Thyroid cancer ([place])    STATUS POST SURGICAL REMOVAL-CURRENT ON THYROID REPLACEMENT   Past Surgical History:  Procedure Laterality Date  . ABDOMINAL HYSTERECTOMY    . APPENDECTOMY    . CARDIAC CATHETERIZATION  05/18/09   NORMAL CATH  . PITUITARY SURGERY    . RIGHT/LEFT HEART CATH AND CORONARY ANGIOGRAPHY N/A 04/02/2018   Procedure: RIGHT/LEFT HEART CATH AND CORONARY ANGIOGRAPHY;  Surgeon: Burnell Blanks, MD;  Location: [facility];  Service: Cardiovascular;  Laterality: N/A;  . VIDEO BRONCHOSCOPY Bilateral 11/14/2018   Procedure: VIDEO BRONCHOSCOPY WITHOUT FLUORO;  Surgeon: Margaretha Seeds, MD;  Location: [facility];  Service: Cardiopulmonary;  Laterality: Bilateral;  . VIDEO BRONCHOSCOPY WITH ENDOBRONCHIAL ULTRASOUND N/A 11/19/2018   Procedure: VIDEO BRONCHOSCOPY WITH ENDOBRONCHIAL ULTRASOUND;  Surgeon: Margaretha Seeds, MD;  Location: [facility];  Service: Thoracic;  Laterality: N/A;   Social History:  reports that she has never smoked. She has never used smokeless tobacco. She reports current alcohol use. She reports that she does not use drugs.  Allergies  Allergen Reactions  . Cymbalta [Duloxetine Hcl] Anxiety  . Hydromorphone Other (See Comments)    BP drop and heart rate  drops  . Buprenorphine Hcl Hives  . Compazine     unknown  . Metoclopramide Other (See Comments)    contractures  . Morphine And Related Hives  . Na Ferric Gluc Cplx In Sucrose Other (See Comments)    unknown  . Ondansetron Rash    [place]  . Promethazine Hcl Hives  . Tegaderm Ag Mesh [Silver] Rash    Old formulation only, is able to tolerate new formulation    Family History  Family history unknown: Yes    Prior to Admission medications   Medication Sig Start Date End Date Taking? Authorizing Provider  albuterol (VENTOLIN HFA) 108 (90 Base) MCG/ACT inhaler Inhale 2 puffs into the lungs every 6 (six) hours as needed for wheezing or shortness of breath.    [provider]  Alum & Mag Hydroxide-Simeth (GI COCKTAIL) SUSP suspension Take 30 mLs daily as needed by mouth for indigestion (BLEEDING ULCERS). Shake well.    [provider]  beclomethasone (QVAR) 80 MCG/ACT inhaler Inhale 2 puffs into the lungs 2 (two) times daily.    [provider]  BREO ELLIPTA 200-25 MCG/INH AEPB INHALE 1 INHALATION INTO THE LUNGS ONCE DAILY. 02/10/16   [provider]  clonazePAM (KLONOPIN) 1 MG tablet Take 1 mg by mouth 3 (three) times daily.    [provider]  dexlansoprazole (DEXILANT) 60 MG capsule Take 60 mg by mouth daily.  [provider]  Diclofenac Potassium (CAMBIA) 50 MG PACK Take 1 packet by mouth 3 (three) times daily as needed. AS NEEDED FOR HEADACHES 10/10/15   [provider]  diphenhydrAMINE (BENADRYL) 50 MG/ML injection Inject 25 mg into the vein every 6 (six) hours as needed (nausea).    [provider]  escitalopram (LEXAPRO) 20 MG tablet Take 20 mg by mouth daily.  04/24/16   [provider]  estradiol (ESTRACE) 2 MG tablet Take 2 mg by mouth daily. 02/01/16   [provider]  Ferrous Sulfate Dried (SLOW RELEASE IRON) 45 MG TBCR Take 1 tablet at bedtime by mouth.    [provider]   furosemide (LASIX) 20 MG tablet Take 20 mg by mouth 2 (two) times daily.    [provider]  hydrocortisone (CORTEF) 10 MG tablet Take 5-10 mg by mouth 2 (two) times daily. Take 10 mg in the am and 5 mg in the pm    [provider]  levofloxacin (LEVAQUIN) 500 MG tablet Take 1 tablet (500 mg total) by mouth daily. 11/20/18   Caren Griffins, MD  levothyroxine (SYNTHROID, LEVOTHROID) 112 MCG tablet Take 112 mcg by mouth daily before breakfast.    [provider]  metoprolol succinate (TOPROL-XL) 25 MG 24 hr tablet Take 37.5 mg by mouth daily.    [provider]  OVER THE COUNTER MEDICATION Take 500 mg by mouth daily. Tumeric cucurmin    [provider]  potassium chloride (K-DUR) 10 MEQ tablet Take 1 tablet (10 mEq total) by mouth daily. 01/23/18 04/23/18  Nahser, Wonda Cheng, MD  potassium citrate (UROCIT-K) 10 MEQ (1080 MG) SR tablet Take 10 mEq by mouth daily. Break pill in half to minimize discomfort/difficulty swallowing. 08/23/18   [provider]  rosuvastatin (CRESTOR) 10 MG tablet Take 10 mg by mouth daily.  02/28/18   [provider]  sucralfate (CARAFATE) 1 g tablet Take 1 g as needed by mouth (FOR ULCERS).    [provider]  topiramate (TOPAMAX) 100 MG tablet Take 150 mg by mouth at bedtime.     [provider]  traMADol (ULTRAM) 50 MG tablet Take 50 mg by mouth daily as needed (migraine).  06/28/16   [provider]  zolpidem (AMBIEN CR) 12.5 MG CR tablet Take 12.5 mg by mouth at bedtime.      [provider]     Consultants:  Dr. Mabeline Caras [facility]    Procedures/Significant Events:  LEFT second meta carpal surgery pending   I have personally reviewed and interpreted all radiology studies and my findings are as above.   VENTILATOR SETTINGS: None   Cultures None  Antimicrobials: Anti-infectives (From admission, onward)   None       Devices     LINES / TUBES:      Continuous Infusions:  Physical Exam: There were no vitals filed for this visit.  Wt Readings from Last 3 Encounters:  11/19/18 80 kg  04/02/18 79.8 kg  04/01/18 80.2 kg    General: A/O x4, positive chronic respiratory distress Eyes: negative scleral hemorrhage, negative anisocoria, negative icterus ENT: Negative Runny nose, negative gingival bleeding, Neck:  Negative scars, masses, torticollis, lymphadenopathy, JVD Lungs: Clear to auscultation bilaterally without wheezes or crackles Cardiovascular: Regular rate and rhythm without murmur gallop or rub normal S1 and S2 Abdomen: negative abdominal pain, positive distention, positive soft, bowel sounds, no rebound, no ascites, no appreciable mass Extremities: No significant cyanosis,  clubbing, positive 1+ bilateral lower extremities edema.  LEFT second metatarsal swollen, erythematous, red, warm to the touch large blister, painful to palpation Skin: See extremities Psychiatric:  Negative depression, negative anxiety, negative fatigue, negative mania  Central nervous system:  Cranial nerves II through XII intact, tongue/uvula midline, all extremities muscle strength 5/5, sensation intact throughout, negative dysarthria, negative expressive aphasia, negative receptive aphasia.        Labs on Admission:  Basic Metabolic Panel: No results for input(s): NA, K, CL, CO2, GLUCOSE, BUN, CREATININE, CALCIUM, MG, PHOS in the last 168 hours. Liver Function Tests: No results for input(s): AST, ALT, ALKPHOS, BILITOT, PROT, ALBUMIN in the last 168 hours. No results for input(s): LIPASE, AMYLASE in the last 168 hours. No results for input(s): AMMONIA in the last 168 hours. CBC: No results for input(s): WBC, NEUTROABS, HGB, HCT, MCV, PLT in the last 168 hours. Cardiac Enzymes: No results for input(s): CKTOTAL, CKMB, CKMBINDEX, TROPONINI in the last 168 hours.  BNP (last 3 results) No results for input(s): BNP in the last  8760 hours.  ProBNP (last 3 results) No results for input(s): PROBNP in the last 8760 hours.  CBG: No results for input(s): GLUCAP in the last 168 hours.  Radiological Exams on Admission: No results found.  EKG: NA  Assessment/Plan Active Problems:   Hodgkin lymphoma (HCC)   Breast cancer (HCC)   Thyroid cancer (HCC)   Adrenal insufficiency (HCC)   Hypertension   Raynaud phenomenon   Hyperlipidemia   Right heart failure (HCC)   Non-ischemic cardiomyopathy (HCC)   Nephrolithiasis   Oxygen dependent   Migraine   Pituitary adenoma ([place])   Cervical cancer ([place])   Pulmonary hypertension (HCC)   Chronic respiratory failure with hypoxia (HCC)   LEFT second metacarpal cellulitis/infection - Care per Dr. Mabeline Caras.  Discussed case this morning and unsure that patient will keep finger. -Pain medication per surgery  Thyroid cancer s/p thyroidectomy - Levothyroxine 112 mcg daily.  Patient has brought her own name brand home medication  Adrenal insufficiency - Hydrocortisone 10 mg qAm./5 mg every PM.  Patient has brought home medication  Nonischemic cardiomyopathy - Strict in and out -Daily weight - Furosemide 20 mg twice daily - Toprol XL 37.5 mg daily - K-Dur CR 10 mEq daily  Pulmonary HTN - See nonischemic cardiomyopathy  Chronic respiratory failure with hypoxia -Titrate O2 to maintain SPO2 89%- 92% - Patient's home regimen is 3 to 4 L O2 via [place] -Qvar 80 mcg 2 puffs twice daily patient has brought medication from home  HLD -Crestor 10 mg daily  Migraine -Topamax 150 mg nightly  Insomnia - Ambien 12.5 mg CR nightly patient has brought medication from home  GERD -Dexilant 60 mg daily patient was brought medication from home - GI cocktail patient is brought medication from home     Code Status: Full (DVT Prophylaxis: Subcu heparin to be started post surgery Family Communication: None Disposition Plan: Per surgery   Data Reviewed: Care during  the described time interval was provided by me .  I have reviewed this patient's available data, including medical history, events of note, physical examination, and all test results as part of my evaluation.   Time spent: 60 min  [place], [facility] Pager [phone]

## 2018-12-23 NOTE — ED Provider Notes (Addendum)
[facility] EMERGENCY DEPARTMENT Provider Note   CSN: [id number] Arrival date & time: 12/23/18  1200    History   Chief Complaint Chief Complaint  Patient presents with  . Hand Injury    HPI [name] is a 48 y.o. female with history of Hodgkin's lymphoma, breast cancer, Addison's disease who presents with left index finger injury.  Patient reports she slammed in a car door yesterday.  She was seen by hand surgeon, Dr. Amedeo Plenty, in the office yesterday and x-rays were taken.  Dr. Amedeo Plenty sent her here to go to the OR for I&D of the finger as it has become darker and more swollen.  Patient has difficulty moving it.  She has difficulty with wound healing considering her previous medical history.     HPI  Past Medical History:  Diagnosis Date  . Addison's disease ([place])   . Adrenal insufficiency ([place])   . Aortic stenosis   . Aortic stenosis   . Appendicitis 12/19/09  . Breast cancer ([place])    STATUS POST BILATERAL MASTECTOMY. STATUS POST RECONSTRUCTION. SHE HAD SILICONE BREAST IMPLANTS AND THE LEFT IMPLANT IS LEAKING SLIGHTLY  . Cervical cancer ([place]) 12/23/2018  . Chest pain   . CHF with right heart failure ([place]) 04/17/2017  . Chronic respiratory failure with hypoxia ([place]) 12/23/2018  . GERD (gastroesophageal reflux disease)   . Hodgkin lymphoma (HCC)    STATUS POST MANTLE RADIATION  . Hypertension   . Osteoporosis   . Palpitations   . Pituitary adenoma ([place]) 12/23/2018  . Pulmonary hypertension ([place]) 12/23/2018  . Raynaud phenomenon   . SVT (supraventricular tachycardia) ([place])   . Tachycardia   . Thyroid cancer ([place])    STATUS POST SURGICAL REMOVAL-CURRENT ON THYROID REPLACEMENT    Patient Active Problem List   Diagnosis Date Noted  . Pituitary adenoma ([place]) 12/23/2018  . Cervical cancer ([place]) 12/23/2018  . Pulmonary hypertension ([place]) 12/23/2018  . Chronic respiratory failure with hypoxia () 12/23/2018  . HCAP (healthcare-associated pneumonia)  11/15/2018  . FUO (fever of unknown origin)   . SOB (shortness of breath)   . Hemoptysis 11/11/2018  . Cellulitis of right middle finger 11/07/2018  . Nephrolithiasis 11/07/2018  . Oxygen dependent 11/07/2018  . Migraine 11/07/2018  . PVC's (premature ventricular contractions) 10/27/2018  . Non-ischemic cardiomyopathy ([place])   . Right heart failure ([place]) 04/17/2017  . Edema 11/16/2014  . Dizziness 11/16/2014  . Hyperlipidemia 03/09/2014  . Hodgkin lymphoma ([place])   . Breast cancer ([place])   . Thyroid cancer ([place])   . Adrenal insufficiency ([place])   . Tachycardia   . Heart palpitations   . Chest discomfort   . Hypertension   . Raynaud phenomenon   . Appendicitis   . GERD (gastroesophageal reflux disease)     Past Surgical History:  Procedure Laterality Date  . ABDOMINAL HYSTERECTOMY    . APPENDECTOMY    . CARDIAC CATHETERIZATION  05/18/09   NORMAL CATH  . PITUITARY SURGERY    . RIGHT/LEFT HEART CATH AND CORONARY ANGIOGRAPHY N/A 04/02/2018   Procedure: RIGHT/LEFT HEART CATH AND CORONARY ANGIOGRAPHY;  Surgeon: Burnell Blanks, MD;  Location: [facility];  Service: Cardiovascular;  Laterality: N/A;  . VIDEO BRONCHOSCOPY Bilateral 11/14/2018   Procedure: VIDEO BRONCHOSCOPY WITHOUT FLUORO;  Surgeon: Margaretha Seeds, MD;  Location: [facility];  Service: Cardiopulmonary;  Laterality: Bilateral;  . VIDEO BRONCHOSCOPY WITH ENDOBRONCHIAL ULTRASOUND N/A 11/19/2018   Procedure: VIDEO BRONCHOSCOPY WITH ENDOBRONCHIAL ULTRASOUND;  Surgeon:  Margaretha Seeds, MD;  Location: [facility];  Service: Thoracic;  Laterality: N/A;     OB History   No obstetric history on file.      Home Medications    Prior to Admission medications   Medication Sig Start Date End Date Taking? Authorizing Provider  albuterol (VENTOLIN HFA) 108 (90 Base) MCG/ACT inhaler Inhale 2 puffs into the lungs every 6 (six) hours as needed for wheezing or shortness of breath.    [provider]  Alum & Mag  Hydroxide-Simeth (GI COCKTAIL) SUSP suspension Take 30 mLs daily as needed by mouth for indigestion (BLEEDING ULCERS). Shake well.    [provider]  beclomethasone (QVAR) 80 MCG/ACT inhaler Inhale 2 puffs into the lungs 2 (two) times daily.    [provider]  BREO ELLIPTA 200-25 MCG/INH AEPB INHALE 1 INHALATION INTO THE LUNGS ONCE DAILY. 02/10/16   [provider]  clonazePAM (KLONOPIN) 1 MG tablet Take 1 mg by mouth 3 (three) times daily.    [provider]  dexlansoprazole (DEXILANT) 60 MG capsule Take 60 mg by mouth daily.      [provider]  Diclofenac Potassium (CAMBIA) 50 MG PACK Take 1 packet by mouth 3 (three) times daily as needed. AS NEEDED FOR HEADACHES 10/10/15   [provider]  diphenhydrAMINE (BENADRYL) 50 MG/ML injection Inject 25 mg into the vein every 6 (six) hours as needed (nausea).    [provider]  escitalopram (LEXAPRO) 20 MG tablet Take 20 mg by mouth daily.  04/24/16   [provider]  estradiol (ESTRACE) 2 MG tablet Take 2 mg by mouth daily. 02/01/16   [provider]  Ferrous Sulfate Dried (SLOW RELEASE IRON) 45 MG TBCR Take 1 tablet at bedtime by mouth.    [provider]  furosemide (LASIX) 20 MG tablet Take 20 mg by mouth 2 (two) times daily.    [provider]  hydrocortisone (CORTEF) 10 MG tablet Take 5-10 mg by mouth 2 (two) times daily. Take 10 mg in the am and 5 mg in the pm    [provider]  levofloxacin (LEVAQUIN) 500 MG tablet Take 1 tablet (500 mg total) by mouth daily. 11/20/18   Caren Griffins, MD  levothyroxine (SYNTHROID, LEVOTHROID) 112 MCG tablet Take 112 mcg by mouth daily before breakfast.    [provider]  metoprolol succinate (TOPROL-XL) 25 MG 24 hr tablet Take 37.5 mg by mouth daily.    [provider]  OVER THE COUNTER MEDICATION Take 500 mg by mouth daily. Tumeric cucurmin    [provider]  potassium  chloride (K-DUR) 10 MEQ tablet Take 1 tablet (10 mEq total) by mouth daily. 01/23/18 04/23/18  Nahser, Wonda Cheng, MD  potassium citrate (UROCIT-K) 10 MEQ (1080 MG) SR tablet Take 10 mEq by mouth daily. Break pill in half to minimize discomfort/difficulty swallowing. 08/23/18   [provider]  rosuvastatin (CRESTOR) 10 MG tablet Take 10 mg by mouth daily.  02/28/18   [provider]  sucralfate (CARAFATE) 1 g tablet Take 1 g as needed by mouth (FOR ULCERS).    [provider]  topiramate (TOPAMAX) 100 MG tablet Take 150 mg by mouth at bedtime.     [provider]  traMADol (ULTRAM) 50 MG tablet Take 50 mg by mouth daily as needed (migraine).  06/28/16   [provider]  zolpidem (AMBIEN CR) 12.5 MG CR tablet Take 12.5 mg by mouth at bedtime.  [provider]    Family History Family History  Family history unknown: Yes    Social History Social History   Tobacco Use  . Smoking status: Never Smoker  . Smokeless tobacco: Never Used  Substance Use Topics  . Alcohol use: Yes    Comment: occassionally  . Drug use: No     Allergies   Cymbalta [duloxetine hcl]; Hydromorphone; Buprenorphine hcl; Compazine; Metoclopramide; Morphine and related; Na ferric gluc cplx in sucrose; Ondansetron; Promethazine hcl; and Tegaderm ag mesh [silver]   Review of Systems Review of Systems  Constitutional: Negative for chills and fever.  HENT: Negative for facial swelling and sore throat.   Respiratory: Positive for shortness of breath (baseline).   Cardiovascular: Negative for chest pain.  Gastrointestinal: Negative for abdominal pain, nausea and vomiting.  Genitourinary: Negative for dysuria.  Musculoskeletal: Positive for arthralgias and joint swelling. Negative for back pain.  Skin: Positive for wound. Negative for rash.  Neurological: Negative for headaches.  Psychiatric/Behavioral: The patient is not nervous/anxious.      Physical Exam  Updated Vital Signs BP (!) 119/93   Pulse 93   Temp 98.7 F (37.1 C) (Oral)   Resp 20   LMP  (LMP Unknown)   SpO2 100%   Physical Exam Vitals signs and nursing note reviewed.  Constitutional:      General: She is not in acute distress.    Appearance: She is well-developed. She is not diaphoretic.  HENT:     Head: Normocephalic and atraumatic.     Mouth/Throat:     Pharynx: No oropharyngeal exudate.  Eyes:     General: No scleral icterus.       Right eye: No discharge.        Left eye: No discharge.     Conjunctiva/sclera: Conjunctivae normal.     Pupils: Pupils are equal, round, and reactive to light.  Neck:     Musculoskeletal: Normal range of motion and neck supple.     Thyroid: No thyromegaly.  Cardiovascular:     Rate and Rhythm: Normal rate and regular rhythm.     Heart sounds: Normal heart sounds. No murmur. No friction rub. No gallop.   Pulmonary:     Effort: Pulmonary effort is normal. No respiratory distress.     Breath sounds: Normal breath sounds. No stridor. No wheezing or rales.  Abdominal:     General: Bowel sounds are normal. There is no distension.     Palpations: Abdomen is soft.     Tenderness: There is no abdominal tenderness. There is no guarding or rebound.  Musculoskeletal:     Comments: Edema, decreased range of motion in the left index finger, ecchymosis with surrounding blistering as imaged below  Lymphadenopathy:     Cervical: No cervical adenopathy.  Skin:    General: Skin is warm and dry.     Coloration: Skin is not pale.     Findings: No rash.  Neurological:     Mental Status: She is alert.     Coordination: Coordination normal.        ED Treatments / Results  Labs (all labs ordered are listed, but only abnormal results are displayed) Labs Reviewed  CBC  BASIC METABOLIC PANEL    EKG None  Radiology No results found.  Procedures Procedures (including critical care time)  Medications Ordered in ED Medications - No  data to display   Initial Impression / Assessment and Plan / ED Course  I have reviewed the  triage vital signs and the nursing notes.  Pertinent labs & imaging results that were available during my care of the patient were reviewed by me and considered in my medical decision making (see chart for details).        Patient presenting with injury to her left index finger after shutting a car door.  She has already been evaluated by hand surgery who plans to take her to the OR now.  X-rays were completed in the office.  Hand surgery will arrange hospitalist admission.  Transfer of care to hand surgeon and hospitalist team.  I appreciate their assistance with the patient.  Final Clinical Impressions(s) / ED Diagnoses   Final diagnoses:  Crushing injury of left index finger, initial encounter    ED Discharge Orders    None            Frederica Kuster, PA-C 12/23/18 1308    Noemi Chapel, MD 12/28/18 1315

## 2018-12-23 NOTE — Progress Notes (Signed)
Pt refuses to  Have cardiac monitoring placed. MD notified.

## 2018-12-24 ENCOUNTER — Other Ambulatory Visit: Payer: Self-pay

## 2018-12-24 ENCOUNTER — Encounter (HOSPITAL_COMMUNITY): Payer: Self-pay | Admitting: Orthopedic Surgery

## 2018-12-24 DIAGNOSIS — I50810 Right heart failure, unspecified: Secondary | ICD-10-CM

## 2018-12-24 DIAGNOSIS — J9611 Chronic respiratory failure with hypoxia: Secondary | ICD-10-CM

## 2018-12-24 DIAGNOSIS — Z888 Allergy status to other drugs, medicaments and biological substances status: Secondary | ICD-10-CM

## 2018-12-24 DIAGNOSIS — Z8541 Personal history of malignant neoplasm of cervix uteri: Secondary | ICD-10-CM

## 2018-12-24 DIAGNOSIS — Z885 Allergy status to narcotic agent status: Secondary | ICD-10-CM

## 2018-12-24 DIAGNOSIS — Z8571 Personal history of Hodgkin lymphoma: Secondary | ICD-10-CM

## 2018-12-24 DIAGNOSIS — Z85850 Personal history of malignant neoplasm of thyroid: Secondary | ICD-10-CM

## 2018-12-24 DIAGNOSIS — S6992XA Unspecified injury of left wrist, hand and finger(s), initial encounter: Secondary | ICD-10-CM | POA: Diagnosis present

## 2018-12-24 DIAGNOSIS — Z953 Presence of xenogenic heart valve: Secondary | ICD-10-CM

## 2018-12-24 DIAGNOSIS — W230XXA Caught, crushed, jammed, or pinched between moving objects, initial encounter: Secondary | ICD-10-CM

## 2018-12-24 LAB — BASIC METABOLIC PANEL
ANION GAP: 7 (ref 5–15)
BUN: 13 mg/dL (ref 6–20)
CO2: 24 mmol/L (ref 22–32)
Calcium: 8.6 mg/dL — ABNORMAL LOW (ref 8.9–10.3)
Chloride: 106 mmol/L (ref 98–111)
Creatinine, Ser: 1.19 mg/dL — ABNORMAL HIGH (ref 0.44–1.00)
GFR calc non Af Amer: 54 mL/min — ABNORMAL LOW (ref >60)
GLUCOSE: 155 mg/dL — AB (ref 70–99)
Potassium: 3.7 mmol/L (ref 3.5–5.1)
Sodium: 137 mmol/L (ref 135–145)

## 2018-12-24 LAB — MAGNESIUM: Magnesium: 1.9 mg/dL (ref 1.7–2.4)

## 2018-12-24 MED ORDER — AMOXICILLIN-POT CLAVULANATE 875-125 MG PO TABS
1.0000 | ORAL_TABLET | Freq: Two times a day (BID) | ORAL | Status: DC
  Administered 2018-12-24 – 2018-12-25 (×3): 1 via ORAL
  Filled 2018-12-24 (×3): qty 1

## 2018-12-24 MED ORDER — DOXYCYCLINE HYCLATE 100 MG PO TABS
100.0000 mg | ORAL_TABLET | Freq: Two times a day (BID) | ORAL | Status: DC
  Administered 2018-12-24 – 2018-12-25 (×3): 100 mg via ORAL
  Filled 2018-12-24 (×3): qty 1

## 2018-12-24 NOTE — Progress Notes (Signed)
[facility]                                                                              Patient Demographics  [name], is a 48 y.o. female, DOB - 10/29/1970, GJG:[id number]  Admit date - 12/23/2018   Admitting Physician Allie Bossier, MD  Outpatient Primary MD for the patient is Toppin, Antionette Poles, MD  Outpatient specialists:   LOS - 1  days   Medical records reviewed and are as summarized below:    Chief Complaint  Patient presents with   Hand Injury       Brief summary   [name] is a 48 y.o.  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 Patient recently discharged on 11/20/2018 for RIGHT third finger ulceration with cellulitis s/p surgery by Dr. Roseanne Kaufman orthopedic hand surgery, LUL pneumonia with acute on chronic respiratory failure with concern for focal alveolar hemorrhage secondary to vasculitis vs pneumonic process with small volume hemoptysis.  Patient was seen by ID and pulmonology and placed on antibiotics and antivirals.  During her stay s/p bronchoscopy unrevealing.  Followed by pulmonology at [facility].  Also during stay patient subacute Hx blisters developing and bilateral hand and arm swelling over last 3 months.  Autoimmune work-up unrevealing except for nonspecific elevated sedimentation rate and rheumatoid factor.  Was supposed to follow-up with rheumatology.  She was  discharged on Levaquin.  On 11/22/2018, patient hit her left index finger in a bathroom door rather violently. She had immediate blistering to the finger and subsequently tried to pop this with 3 or so attempts with a needle at home. She began having more swelling and pain and was seen by Dr. Amedeo Plenty and was placed on Keflex.  Due to concerns for infectious tenosynovitis, patient was admitted by Dr. Amedeo Plenty for OR.    Assessment & Plan    LEFT second metacarpal cellulitis, with a crushing injury -Patient was seen by Dr. Amedeo Plenty, underwent exploration with irrigation and debridement. -Management per hand surgery -Cultures so far negative, patient has multiple allergies as well as recent courses of Levaquin and Keflex - Patient was placed on IV vancomycin and Zosyn, also has a port.  Consulted ID, Dr. Bridget Hartshorn. - Per hand surgery dressing change on 3/26, follow cultures  Thyroid cancer s/p thyroidectomy - Continue levothyroxine   Adrenal insufficiency - Continue Cortef 10 mg a.m., 5 mg p.m.   History of nonischemic cardiomyopathy, pulmonary hypertension -2D echo 11/09/2018 had shown EF of 60 to 04%, normal systolic function. -Continue Lasix, Toprol-XL, potassium replacement -Strict I's and O's  Chronic respiratory failure with hypoxia -Patient's home regimen is 3 to 4 L O2 via nasal cannula -  Continue O2 supplementation, Qvar  Hyperlipidemia Continue Crestor 10 mg daily  History of migraines Continue Topamax  Insomnia Continue Ambien 12.5 mg CR, patient has brought medication from home   GERD Continue PPI Patient reports that only IV Benadryl works for her nausea   Code Status: Full CODE STATUS DVT Prophylaxis: SCDs Family Communication: Discussed in detail with the patient, all imaging results, lab results explained to the patient    Disposition Plan: Likely in a.m., will discuss with hand surgery after dressing change  Time Spent in minutes 35 minutes*    Procedures:  12/23/2018:  # 1volar finger incision with flexor tendon exploration and tenolysis tenosynovectomy with cultures taken.    #2 dorsal incision and extensor tendon exploration with irrigation debridement of the skin and de-epithelialized tissue.  This was an extensor tendon tenolysis tenosynovectomy and exploration  Consultants:   Hand surgery Infectious disease  Antimicrobials:   Anti-infectives (From admission, onward)   Start     Dose/Rate Route Frequency Ordered Stop   12/24/18 1800  vancomycin (VANCOCIN) 1,500 mg in sodium chloride 0.9 % 500 mL IVPB     1,500 mg 250 mL/hr over 120 Minutes Intravenous Every 24 hours 12/23/18 1805     12/23/18 1900  vancomycin (VANCOCIN) 500 mg in sodium chloride 0.9 % 100 mL IVPB     500 mg 100 mL/hr over 60 Minutes Intravenous  Once 12/23/18 1805 12/23/18 2127   12/23/18 1800  fluconazole (DIFLUCAN) tablet 150 mg     150 mg Oral Daily 12/23/18 1401 01/01/19 0959   12/23/18 1600  piperacillin-tazobactam (ZOSYN) IVPB 3.375 g     3.375 g 100 mL/hr over 30 Minutes Intravenous To Surgery 12/23/18 1554 12/23/18 1603          Medications  Scheduled Meds:  aspirin EC  81 mg Oral Daily   budesonide (PULMICORT) nebulizer solution  0.25 mg Nebulization BID   clonazePAM  1 mg Oral TID   dexlansoprazole  60 mg Oral Daily   escitalopram  20 mg Oral Daily   estradiol  2 mg Oral Daily   fluconazole  150 mg Oral Daily   fluticasone furoate-vilanterol  1 puff Inhalation Daily   furosemide  20 mg Oral BID   [START ON 12/25/2018] heparin  5,000 Units Subcutaneous Q8H   hydrocortisone  20 mg Oral Daily   And   hydrocortisone  10 mg Oral QPC lunch   [START ON 12/27/2018] hydrocortisone  10 mg Oral Daily   [START ON 12/26/2018] hydrocortisone  5 mg Oral QPC lunch   levothyroxine  112 mcg Oral QAC breakfast   metoprolol succinate  37.5 mg Oral Daily   potassium chloride  10 mEq Oral Daily   rosuvastatin  10 mg Oral Daily    scopolamine  1 patch Transdermal Q72H   senna-docusate  1 tablet Oral BID   sodium chloride flush  10-40 mL Intracatheter Q12H   topiramate  150 mg Oral QHS   traMADol  100 mg Oral Q6H   zolpidem  10 mg Oral QHS   Continuous Infusions:  methocarbamol (ROBAXIN) IV     vancomycin     PRN Meds:.albuterol, diclofenac, diphenhydrAMINE, gi cocktail, HYDROmorphone (DILAUDID) injection, HYDROmorphone, methocarbamol (ROBAXIN) IV, methocarbamol, sodium chloride flush, sucralfate      Subjective:   [name] was seen and examined today.  States pain is controlled with IV Dilaudid.  Somewhat anxious.  Patient denies dizziness, chest pain, shortness of breath, abdominal pain, N/V/D/C, new weakness, numbess, tingling.  No acute events overnight.    Objective:   Vitals:   12/23/18 1718 12/23/18 2030 12/23/18 2332 12/24/18 0500  BP: 110/70 109/74 119/74   Pulse: 71 90 90   Resp: _0 Temp: 98.2 F (36.8 C) 97.9 F (36.6 C) 98.2 F (36.8 C)   TempSrc: Oral Oral Oral   SpO2: 94% 100% 95%   Weight:    80.9 kg  Height:        Intake/Output Summary (Last 24 hours) at 12/24/2018 1151 Last data filed at 12/24/2018 0900 Gross per 24 hour  Intake 1794.82 ml  Output 1610 ml  Net 184.82 ml     Wt Readings from Last 3 Encounters:  12/24/18 80.9 kg  11/19/18 80 kg  04/02/18 79.8 kg     Exam  General: Alert and oriented x 3, NAD  Eyes:   HEENT:    Cardiovascular: S1 S2 auscultated. Regular rate and rhythm.  Respiratory: Clear to auscultation bilaterally, no wheezing, rales or rhonchi  Gastrointestinal: Soft, nontender, nondistended, + bowel sounds  Ext: no pedal edema bilaterally  Neuro: No new deficits  Musculoskeletal: No digital cyanosis, clubbing  Skin: Dressing intact left hand  Psych: Normal affect and demeanor, alert and oriented x3    Data Reviewed:  I have personally reviewed following labs and imaging studies  Micro Results Recent Results  (from the past 240 hour(s))  Aerobic/Anaerobic Culture (surgical/deep wound)     Status: None (Preliminary result)   Collection Time: 12/23/18  3:41 PM  Result Value Ref Range Status   Specimen Description ABSCESS  Final   Special Requests LEFT INDEX FINGER SAMPLE A  Final   Gram Stain NO WBC SEEN NO ORGANISMS SEEN   Final   Culture   Final    NO GROWTH < 24 HOURS Performed at [facility], [address] [place], [place] [address]    Report Status PENDING  Incomplete  Aerobic/Anaerobic Culture (surgical/deep wound)     Status: None (Preliminary result)   Collection Time: 12/23/18  3:53 PM  Result Value Ref Range Status   Specimen Description ABSCESS  Final   Special Requests LEFT INDEX FINGER SAMPLE B  Final   Gram Stain NO WBC SEEN NO ORGANISMS SEEN   Final   Culture   Final    NO GROWTH < 24 HOURS Performed at [facility], [place] [address], [place], [place] [address]    Report Status PENDING  Incomplete    Radiology Reports No results found.  Lab Data:  CBC: Recent Labs  Lab 12/23/18 1356  WBC 8.9  HGB 11.8*  HCT 38.1  MCV 90.5  PLT 517   Basic Metabolic Panel: Recent Labs  Lab 12/23/18 1356 12/24/18 0412  NA 136 137  K 3.2* 3.7  CL 108 106  CO2 21* 24  GLUCOSE 149* 155*  BUN 12 13  CREATININE 1.03* 1.19*  CALCIUM 8.5* 8.6*  MG 1.9 1.9   GFR: Estimated Creatinine Clearance: 61.4 mL/min (A) (by C-G formula based on SCr of 1.19 mg/dL (H)). Liver Function Tests: No results for input(s): AST, ALT, ALKPHOS, BILITOT, PROT, ALBUMIN in the last 168 hours. No results for input(s): LIPASE, AMYLASE in the last 168 hours. No results for input(s): AMMONIA in the last 168 hours. Coagulation Profile: No results for input(s): INR, PROTIME in the last 168 hours. Cardiac Enzymes: No results for input(s): CKTOTAL, CKMB, CKMBINDEX, TROPONINI in the last 168 hours. BNP (last 3 results)  No results for input(s): PROBNP in the last 8760 hours. HbA1C: No  results for input(s): HGBA1C in the last 72 hours. CBG: No results for input(s): GLUCAP in the last 168 hours. Lipid Profile: No results for input(s): CHOL, HDL, LDLCALC, TRIG, CHOLHDL, LDLDIRECT in the last 72 hours. Thyroid Function Tests: No results for input(s): TSH, T4TOTAL, FREET4, T3FREE, THYROIDAB in the last 72 hours. Anemia Panel: No results for input(s): VITAMINB12, FOLATE, FERRITIN, TIBC, IRON, RETICCTPCT in the last 72 hours. Urine analysis:    Component Value Date/Time   COLORURINE YELLOW 11/13/2018 0520   APPEARANCEUR CLOUDY (A) 11/13/2018 0520   LABSPEC 1.017 11/13/2018 0520   PHURINE 8.0 11/13/2018 0520   GLUCOSEU NEGATIVE 11/13/2018 0520   HGBUR NEGATIVE 11/13/2018 0520   BILIRUBINUR NEGATIVE 11/13/2018 0520   KETONESUR NEGATIVE 11/13/2018 0520   PROTEINUR NEGATIVE 11/13/2018 0520   UROBILINOGEN 1.0 06/16/2015 1045   NITRITE NEGATIVE 11/13/2018 0520   LEUKOCYTESUR NEGATIVE 11/13/2018 0520     [name] M.D. [facility] 12/24/2018, 11:51 AM  Pager: [phone] Between 7am to 7pm - call Pager - [phone]  After 7pm go to [email or web address] - password [email or web address]  Call night coverage person covering after 7pm

## 2018-12-24 NOTE — Plan of Care (Signed)
  Problem: Pain Managment: Goal: General experience of comfort will improve Outcome: Progressing

## 2018-12-24 NOTE — Progress Notes (Signed)
Patient ID: [name], female   DOB: [birth year], 48 y.o.   MRN: [id number] Stable at bedside.  No complications.  I will perform dressing change tomorrow.  We will continue broad-spectrum antibiotics and see her tomorrow for dressing change.  The patient has a history of complex excoriations and certainly this left index finger issue will be quite a challenge for her in terms of healing.  I discussed with her the importance of elevation antibiotics and other measures.  I look forward to changing her dressing tomorrow and hopefully will see some good improvement.  Gram stain is negative.  We will await final cultures but I do not anticipate heavy growth given the intraoperative findings.  Khyren Hing MD

## 2018-12-24 NOTE — Progress Notes (Signed)
Patient ID: [name], female   DOB: 1970/11/07, 48 y.o.   MRN: [id number]         [facility] for Infectious Disease    Date of Admission:  12/23/2018   Total days of antibiotics 3              Reason for Consult: Traumatic injury to left index finger with possible secondary infection   Referring Provider: Dr. Marijo Sanes  Assessment: I suspect that this is all due to trauma to her swollen hand rather than primary infection.  However I agree with continuing empiric antibiotic therapy pending further observation by Dr. Amedeo Plenty and final culture results.  I will switch her to oral amoxicillin clavulanate and doxycycline.  I would plan on 7 days of treatment.  I will follow-up final cultures and make any necessary adjustments in therapy.  Plan: 1. Change antibiotics to oral amoxicillin clavulanate and doxycycline  Principal Problem:   Injury of left index finger Active Problems:   Hodgkin lymphoma (HCC)   Breast cancer (HCC)   Thyroid cancer (HCC)   Adrenal insufficiency (HCC)   Hypertension   Raynaud phenomenon   Hyperlipidemia   Right heart failure (HCC)   Non-ischemic cardiomyopathy (HCC)   Nephrolithiasis   Oxygen dependent   Migraine   Pituitary adenoma (HCC)   Cervical cancer ([place])   Pulmonary hypertension (HCC)   Chronic respiratory failure with hypoxia (HCC)   Scheduled Meds: . aspirin EC  81 mg Oral Daily  . budesonide (PULMICORT) nebulizer solution  0.25 mg Nebulization BID  . clonazePAM  1 mg Oral TID  . dexlansoprazole  60 mg Oral Daily  . escitalopram  20 mg Oral Daily  . estradiol  2 mg Oral Daily  . fluconazole  150 mg Oral Daily  . fluticasone furoate-vilanterol  1 puff Inhalation Daily  . furosemide  20 mg Oral BID  . [START ON 12/25/2018] heparin  5,000 Units Subcutaneous Q8H  . hydrocortisone  20 mg Oral Daily   And  . hydrocortisone  10 mg Oral QPC lunch  . [START ON 12/27/2018] hydrocortisone  10 mg Oral Daily  . [START ON 12/26/2018]  hydrocortisone  5 mg Oral QPC lunch  . levothyroxine  112 mcg Oral QAC breakfast  . metoprolol succinate  37.5 mg Oral Daily  . potassium chloride  10 mEq Oral Daily  . rosuvastatin  10 mg Oral Daily  . scopolamine  1 patch Transdermal Q72H  . senna-docusate  1 tablet Oral BID  . sodium chloride flush  10-40 mL Intracatheter Q12H  . topiramate  150 mg Oral QHS  . traMADol  100 mg Oral Q6H  . zolpidem  10 mg Oral QHS   Continuous Infusions: . methocarbamol (ROBAXIN) IV    . vancomycin     PRN Meds:.albuterol, diclofenac, diphenhydrAMINE, gi cocktail, HYDROmorphone (DILAUDID) injection, HYDROmorphone, methocarbamol (ROBAXIN) IV, methocarbamol, sodium chloride flush, sucralfate  HPI: [name] is a 48 y.o. female with a very complex history of multiple cancers, chronic respiratory insufficiency and heart failure.  She has had increased swelling of her arms and hands for the last several months.  She has had the development of blisters on her fingers intermittently.  She was hospitalized in February with necrotic ulcers that formed after blisters occurred on her right third finger.  MRI was negative for any deep infection.  A biopsy showed "epidermal ulceration suggestive of trauma or excoriation".  She also had new left upper lobe infiltrates and  hemoptysis.  Respiratory virus panel, blood cultures and bronchoscopy were all negative for any definitive cause.  She was treated with IV antibiotics and oseltamivir in the hospital and discharged on levofloxacin.  The right hand lesions healed slowly.  Four days ago, while she was at church, an automatic door closed suddenly striking her forcefully on her hand.  She developed a large blister on the dorsum of her left index finger almost immediately afterward.  She punctured the blister with a needle.  She was seen by her hand surgeon, Dr. Amedeo Plenty, 2 days ago.  She was started on oral cephalexin and admitted yesterday for surgery.  There was little  evidence of deep infection at the time of surgery.  She was started on broad empiric antibiotic therapy with vancomycin, piperacillin tazobactam and fluconazole.  Operative Gram stain did not show any organisms and cultures are negative at 24 hours.  She remains afebrile.   Review of Systems: Review of Systems  Constitutional: Negative for chills, diaphoresis and fever.  Musculoskeletal: Positive for joint pain.    Past Medical History:  Diagnosis Date  . Addison's disease ([place])   . Adrenal insufficiency ([place])   . Aortic stenosis   . Aortic stenosis   . Appendicitis 12/19/09  . Breast cancer ([place])    STATUS POST BILATERAL MASTECTOMY. STATUS POST RECONSTRUCTION. SHE HAD SILICONE BREAST IMPLANTS AND THE LEFT IMPLANT IS LEAKING SLIGHTLY  . Cervical cancer ([place]) 12/23/2018  . Chest pain   . CHF with right heart failure ([place]) 04/17/2017  . Chronic respiratory failure with hypoxia ([place]) 12/23/2018  . GERD (gastroesophageal reflux disease)   . Hodgkin lymphoma (HCC)    STATUS POST MANTLE RADIATION  . Hypertension   . Hypoxia   . Osteoporosis   . Palpitations   . Pituitary adenoma ([place]) 12/23/2018  . Pulmonary hypertension ([place]) 12/23/2018  . Raynaud phenomenon   . SVT (supraventricular tachycardia) ([place])   . Tachycardia   . Thyroid cancer ([place])    STATUS POST SURGICAL REMOVAL-CURRENT ON THYROID REPLACEMENT    Social History   Tobacco Use  . Smoking status: Never Smoker  . Smokeless tobacco: Never Used  Substance Use Topics  . Alcohol use: Yes    Comment: occassionally  . Drug use: No    Family History  Family history unknown: Yes   Allergies  Allergen Reactions  . Na Ferric Gluc Cplx In Sucrose Anaphylaxis  . Cymbalta [Duloxetine Hcl] Swelling and Anxiety  . Hydromorphone Other (See Comments)    BP drop and heart rate drops  . Succinylcholine Other (See Comments)    Lock Jaw  . Ativan [Lorazepam] Rash and Other (See Comments)    Dystonia  . Buprenorphine Hcl Hives  .  Compazine Other (See Comments)    Altered mental status Aggression  . Metoclopramide Other (See Comments)    Dystonia  . Morphine And Related Hives  . Ondansetron Hives and Rash  . Promethazine Hcl Hives  . Tegaderm Ag Mesh [Silver] Rash    Old formulation only, is able to tolerate new formulation    OBJECTIVE: Blood pressure 119/74, pulse 90, temperature 98.2 F (36.8 C), temperature source Oral, resp. rate 12, height _0  (1.651 m), weight 80.9 kg, SpO2 95 %.  Physical Exam Constitutional:      Comments: She is slightly groggy but talkative and in good spirits.  Chest:    Musculoskeletal:     Comments: She has a bulky surgical dressing on her left hand and forearm.  Psychiatric:        Mood and Affect: Mood normal.       Lab Results Lab Results  Component Value Date   WBC 8.9 12/23/2018   HGB 11.8 (L) 12/23/2018   HCT 38.1 12/23/2018   MCV 90.5 12/23/2018   PLT 256 12/23/2018    Lab Results  Component Value Date   CREATININE 1.19 (H) 12/24/2018   BUN 13 12/24/2018   NA 137 12/24/2018   K 3.7 12/24/2018   CL 106 12/24/2018   CO2 24 12/24/2018    Lab Results  Component Value Date   ALT 32 11/19/2018   AST 26 11/19/2018   ALKPHOS 58 11/19/2018   BILITOT 0.1 (L) 11/19/2018     Microbiology: Recent Results (from the past 240 hour(s))  Aerobic/Anaerobic Culture (surgical/deep wound)     Status: None (Preliminary result)   Collection Time: 12/23/18  3:41 PM  Result Value Ref Range Status   Specimen Description ABSCESS  Final   Special Requests LEFT INDEX FINGER SAMPLE A  Final   Gram Stain NO WBC SEEN NO ORGANISMS SEEN   Final   Culture   Final    NO GROWTH < 24 HOURS Performed at [facility], [address], [place], [place] [address]    Report Status PENDING  Incomplete  Aerobic/Anaerobic Culture (surgical/deep wound)     Status: None (Preliminary result)   Collection Time: 12/23/18  3:53 PM  Result Value Ref Range Status   Specimen  Description ABSCESS  Final   Special Requests LEFT INDEX FINGER SAMPLE B  Final   Gram Stain NO WBC SEEN NO ORGANISMS SEEN   Final   Culture   Final    NO GROWTH < 24 HOURS Performed at [facility], [place] [address] [place], [place] [address]    Report Status PENDING  Incomplete    Michel Bickers, MD [facility] for Infectious [place] [facility] [phone] pager   [phone] cell 12/24/2018, 1:28 PM

## 2018-12-25 DIAGNOSIS — X58XXXA Exposure to other specified factors, initial encounter: Secondary | ICD-10-CM

## 2018-12-25 DIAGNOSIS — Z95828 Presence of other vascular implants and grafts: Secondary | ICD-10-CM

## 2018-12-25 DIAGNOSIS — Z9981 Dependence on supplemental oxygen: Secondary | ICD-10-CM

## 2018-12-25 DIAGNOSIS — S6992XD Unspecified injury of left wrist, hand and finger(s), subsequent encounter: Secondary | ICD-10-CM

## 2018-12-25 LAB — BASIC METABOLIC PANEL
ANION GAP: 7 (ref 5–15)
BUN: 15 mg/dL (ref 6–20)
CO2: 27 mmol/L (ref 22–32)
Calcium: 8.7 mg/dL — ABNORMAL LOW (ref 8.9–10.3)
Chloride: 106 mmol/L (ref 98–111)
Creatinine, Ser: 0.94 mg/dL (ref 0.44–1.00)
GFR calc Af Amer: >60 mL/min (ref >60)
GFR calc non Af Amer: >60 mL/min (ref >60)
Glucose, Bld: 106 mg/dL — ABNORMAL HIGH (ref 70–99)
POTASSIUM: 3.9 mmol/L (ref 3.5–5.1)
Sodium: 140 mmol/L (ref 135–145)

## 2018-12-25 MED ORDER — DIPHENHYDRAMINE HCL 50 MG/ML IJ SOLN: 50.0000 mg | Freq: Four times a day (QID) | INTRAMUSCULAR | 0 refills | Status: DC | PRN

## 2018-12-25 MED ORDER — DOXYCYCLINE HYCLATE 100 MG PO TABS: 100.0000 mg | ORAL_TABLET | Freq: Two times a day (BID) | ORAL | 0 refills | Status: DC

## 2018-12-25 MED ORDER — SENNOSIDES-DOCUSATE SODIUM 8.6-50 MG PO TABS: 1.0000 | ORAL_TABLET | Freq: Two times a day (BID) | ORAL | 0 refills | Status: DC

## 2018-12-25 MED ORDER — AMOXICILLIN-POT CLAVULANATE 875-125 MG PO TABS: 1.0000 | ORAL_TABLET | Freq: Two times a day (BID) | ORAL | 0 refills | Status: AC

## 2018-12-25 MED ORDER — AMOXICILLIN-POT CLAVULANATE 875-125 MG PO TABS: 1.0000 | ORAL_TABLET | Freq: Two times a day (BID) | ORAL | 0 refills | Status: DC

## 2018-12-25 MED ORDER — HEPARIN SOD (PORK) LOCK FLUSH 100 UNIT/ML IV SOLN
500.0000 [IU] | INTRAVENOUS | Status: AC | PRN
  Administered 2018-12-25: 500 [IU]

## 2018-12-25 MED ORDER — METHOCARBAMOL 500 MG PO TABS: 500.0000 mg | ORAL_TABLET | Freq: Four times a day (QID) | ORAL | 0 refills | Status: DC | PRN

## 2018-12-25 MED ORDER — HYDROMORPHONE HCL 4 MG PO TABS: 4.0000 mg | ORAL_TABLET | Freq: Four times a day (QID) | ORAL | 0 refills | Status: AC | PRN

## 2018-12-25 MED ORDER — HYDROMORPHONE HCL 4 MG PO TABS: 4.0000 mg | ORAL_TABLET | Freq: Four times a day (QID) | ORAL | 0 refills | Status: DC | PRN

## 2018-12-25 MED ORDER — DOXYCYCLINE HYCLATE 100 MG PO TABS: 100.0000 mg | ORAL_TABLET | Freq: Two times a day (BID) | ORAL | 0 refills | Status: AC

## 2018-12-25 MED FILL — SENNA S 8.6-50 MG TABS: 8.6-50 | 30 days supply | Qty: 60 | Fill #0

## 2018-12-25 MED FILL — HYDROmorphone HCL 4 MG TABS: 4 | 7 days supply | Qty: 28 | Fill #0

## 2018-12-25 MED FILL — AMOX-CLAV 875-125 MG TABLET: 875-125 | 7 days supply | Qty: 14 | Fill #0

## 2018-12-25 MED FILL — DIPHENHYDRAMINE 50 MG/ML VI: 50 | 7 days supply | Qty: 25 | Fill #0

## 2018-12-25 MED FILL — DOXYCYCLINE HYCLATE 100 MG: 100 | 7 days supply | Qty: 14 | Fill #0

## 2018-12-25 MED FILL — METHOCARBAMOL 500 MG TABLET: 500 | 15 days supply | Qty: 60 | Fill #0

## 2018-12-25 NOTE — Progress Notes (Signed)
Patient ID: [name], female   DOB: [birth year], 48 y.o.   MRN: [id number] Patient has bed examined at bedside.  I remove the bandage very carefully and performed a dressing change.  The dressing change went without difficulty.  Following dressing change the patient looks quite well in terms no erythema or cellulitic change.  She certainly demonstrates a blistered area dorsally which I dressed with Xeroform but has no evidence of gross infection or other problems in terms of any foul discharge.  There is some mild local swelling dorsally and volar in location but no infectious tenosynovitis   I discussed the cultures with the patient.  Currently she is no growth.  At the time of surgery she had no absolute infectious pocket of fluid.  She had sero-sanguinous changes.  I once again discussed all issues with the patient and following this redressed her wound in a very careful fashion.  She tolerated this well   At present juncture I do feel comfortable discharging her with the Augmentin and doxycycline as broad-spectrum coverage.  I would keep the bandage clean and dry until she sees me Monday.  I would ask that she not remove the bandage until I see her.  She understands this and my recommendations of strict elevation.   Her laboratory analysis looks good.  Her cultures are negative to date and we will check these Monday.  Should any problems occur I will be available.  We discussed the challenging day that she had and other issues surrounding her care at great length.  Once again I tried to focus the patient on elevation taking the antibiotics and keeping the bandage clean and dry and to not remove the bandage.  I have an appointment for her Monday in my office and she is aware of this and the follow-up plans   All questions have been asked encouraged and answered   I discussed her care and plan from hand surgery perspective with nursing staff at great length.  Sunset Joshi MD

## 2018-12-25 NOTE — Progress Notes (Signed)
VAST RN consulted to deaccess pt's IP for discharge. Upon arrival at pt's bedside, unit RN requested I wait outside of pt's room momentarily. Upon exiting pt's room, unit RN stated pt would not let VAST RN deaccess IP. Unit RN to speak with physician.

## 2018-12-25 NOTE — Discharge Instructions (Signed)
Crush Injury of the Hand A crush injury of the hand happens when a great amount of force is suddenly applied to your hand. For example, this might happen if a heavy load falls on your hand. This injury can damage your skin and many parts (structures) in the hand and wrist joint. Treatment will depend on which parts are damaged and how severe your injury is. Follow these instructions at home: If you have a splint:  Wear the splint as told by your doctor. Remove it only as told by your doctor.  Loosen the splint if your fingers tingle, get numb, or turn cold and blue.  Do not let your splint get wet if it is not waterproof.  Keep the splint clean. Wound care   If you have any skin wounds that were covered with bandages (dressings), follow instructions from your doctor about how to take care of your wounds. Make sure you: ? Wash your hands with soap and water before you change your bandage. If you cannot use soap and water, use hand sanitizer. ? Change your bandage as told by your doctor. ? Leave stitches (sutures), skin glue, or skin tape (adhesive) strips in place. They may need to stay in place for 2 weeks or longer. If tape strips get loose and curl up, you may trim the loose edges. Do not remove tape strips completely unless your doctor says it is okay.  If you have skin wounds, check them every day for signs of infection. Check for: ? More redness, swelling, or pain. ? More fluid or blood. ? Warmth. ? Pus or a bad smell. Managing pain, stiffness, and swelling  If directed, put ice on the injured area. ? Put ice in a plastic bag. ? Place a towel between your skin and the bag. ? Leave the ice on for 20 minutes, 2-3 times a day.  Raise (elevate) the injured area above the level of your heart while you are sitting or lying down. Driving  Do not drive or use heavy machinery while taking prescription pain medicine.  Ask your doctor when it is safe to drive if you have a splint on  your hand or arm. Activity  Return to your normal activities as told by your doctor. Ask your doctor what activities are safe for you.  Work with a physical therapist (PT) or occupational therapist (OT) as told by your doctor. General instructions  Do not put pressure on any part of the splint until it is fully hardened. This may take many hours.  If you have a splint and it is not waterproof, cover it with a watertight plastic bag when you take a bath or a shower.  Take over-the-counter and prescription medicines only as told by your doctor.  If you were prescribed an antibiotic, take it as told by your doctor. Do not stop taking the antibiotic before the prescription is done.  Do not use any tobacco products, such as cigarettes, chewing tobacco, and e-cigarettes. If you need help quitting, ask your doctor.  Keep all follow-up visits as told by your doctor. This is important. These include PT and OT visits. Contact a doctor if:  A wound with stitches opens up.  You have more redness, swelling, or pain in your hand.  You have more fluid or blood coming from your hand.  Your hand feels warm to the touch.  You have pus or a bad smell coming from your hand.  You have a fever. Get help right  away if:  You suddenly have very bad pain in your hand.  You had feeling (sensation) in your hand before but you suddenly lose feeling.  Your wrist or hand becomes bent (contracted) without you trying to bend it.  Your symptoms had gotten better and they suddenly get worse.  Your hand or fingers are turning pink or blue. This information is not intended to replace advice given to you by your health care provider. Make sure you discuss any questions you have with your health care provider. Document Released: 03/07/2010 Document Revised: 02/23/2016 Document Reviewed: 05/11/2015 Elsevier Interactive Patient Education  2019 [employer].

## 2018-12-25 NOTE — Progress Notes (Signed)
CSW spoke with patient's husband on facesheet contact list who reports he is able to pick patient up anytime today. After consulting with nurse it is agreed patient's spouse will arrive at main entrance of [facility] around 4:00 pm today to pick up patient at discharge.   [place], [place]

## 2018-12-25 NOTE — Plan of Care (Signed)
Pt  ambulating with a steady gait. LUE surgical dsg C/D/I. Pt rates pain 7-9/10. During follow up pain rated 3/10 and  able to sleep. Wound vac with scant amount of serosanguineous drainage noted. Call light and phone in reach. Rounds continued per unit policy and MD orders.

## 2018-12-25 NOTE — Progress Notes (Signed)
Patient ID: [name], female   DOB: [birth year], 48 y.o.   MRN: [id number]         [facility] for Infectious Disease    Date of Admission:  12/23/2018   Total days of antibiotics 4               Assessment: Ms. Cummiskey seems to be doing well this morning. She has not had her dressing changed yet but I suspect Dr. Amedeo Plenty will be around to see her and take this down. So far no growth on her cultures. She is doing well with amoxicillin clavulanate and doxycycline and will need 6 more days of therapy. We will arrange for antibiotics to be brought to her bed at discharge.   Will continue to follow cultures through maturity and make any necessary adjustments in therapy. Follow up with Dr. Amedeo Plenty as directed but will be happy to help in the future should her condition change.   Plan: 1. D/C with doxycycline + Augmentin 6 more days.    Principal Problem:   Injury of left index finger Active Problems:   Hodgkin lymphoma (HCC)   Breast cancer (HCC)   Thyroid cancer (HCC)   Adrenal insufficiency (HCC)   Hypertension   Raynaud phenomenon   Hyperlipidemia   Right heart failure (HCC)   Non-ischemic cardiomyopathy (HCC)   Nephrolithiasis   Oxygen dependent   Migraine   Pituitary adenoma ([place])   Cervical cancer ([place])   Pulmonary hypertension (HCC)   Chronic respiratory failure with hypoxia (HCC)   Scheduled Meds: . amoxicillin-clavulanate  1 tablet Oral Q12H  . aspirin EC  81 mg Oral Daily  . budesonide (PULMICORT) nebulizer solution  0.25 mg Nebulization BID  . clonazePAM  1 mg Oral TID  . dexlansoprazole  60 mg Oral Daily  . doxycycline  100 mg Oral Q12H  . escitalopram  20 mg Oral Daily  . estradiol  2 mg Oral Daily  . fluconazole  150 mg Oral Daily  . fluticasone furoate-vilanterol  1 puff Inhalation Daily  . furosemide  20 mg Oral BID  . heparin  5,000 Units Subcutaneous Q8H  . hydrocortisone  20 mg Oral Daily   And  . hydrocortisone  10 mg Oral QPC lunch  . [START  ON 12/27/2018] hydrocortisone  10 mg Oral Daily  . [START ON 12/26/2018] hydrocortisone  5 mg Oral QPC lunch  . levothyroxine  112 mcg Oral QAC breakfast  . metoprolol succinate  37.5 mg Oral Daily  . potassium chloride  10 mEq Oral Daily  . rosuvastatin  10 mg Oral Daily  . scopolamine  1 patch Transdermal Q72H  . senna-docusate  1 tablet Oral BID  . sodium chloride flush  10-40 mL Intracatheter Q12H  . topiramate  150 mg Oral QHS  . traMADol  100 mg Oral Q6H  . zolpidem  10 mg Oral QHS   Continuous Infusions: . methocarbamol (ROBAXIN) IV     PRN Meds:.albuterol, diclofenac, diphenhydrAMINE, gi cocktail, HYDROmorphone (DILAUDID) injection, HYDROmorphone, methocarbamol (ROBAXIN) IV, methocarbamol, sodium chloride flush, sucralfate  Subjective: Slept well last night. Recounts of her medical history and primary injury with review of recent events. She is tolerating her antibiotics well. She says she slept in her foam device to elevate her hand. Afebrile. Back on nasal cannula (chronic for her). Concerned about benadryl IV access outpatient d/t nausea.    Review of Systems: Review of Systems  Constitutional: Negative for chills, diaphoresis and fever.  Gastrointestinal: Negative  for abdominal pain, diarrhea and vomiting.  Genitourinary: Negative for dysuria.  Musculoskeletal: Positive for joint pain.  Skin: Negative for rash.    Past Medical History:  Diagnosis Date  . Addison's disease ([place])   . Adrenal insufficiency ([place])   . Aortic stenosis   . Aortic stenosis   . Appendicitis 12/19/09  . Breast cancer ([place])    STATUS POST BILATERAL MASTECTOMY. STATUS POST RECONSTRUCTION. SHE HAD SILICONE BREAST IMPLANTS AND THE LEFT IMPLANT IS LEAKING SLIGHTLY  . Cervical cancer ([place]) 12/23/2018  . Chest pain   . CHF with right heart failure ([place]) 04/17/2017  . Chronic respiratory failure with hypoxia ([place]) 12/23/2018  . GERD (gastroesophageal reflux disease)   . Hodgkin lymphoma (HCC)     STATUS POST MANTLE RADIATION  . Hypertension   . Hypoxia   . Osteoporosis   . Palpitations   . Pituitary adenoma ([place]) 12/23/2018  . Pulmonary hypertension ([place]) 12/23/2018  . Raynaud phenomenon   . SVT (supraventricular tachycardia) ([place])   . Tachycardia   . Thyroid cancer ([place])    STATUS POST SURGICAL REMOVAL-CURRENT ON THYROID REPLACEMENT    Social History   Tobacco Use  . Smoking status: Never Smoker  . Smokeless tobacco: Never Used  Substance Use Topics  . Alcohol use: Yes    Comment: occassionally  . Drug use: No    Family History  Family history unknown: Yes   Allergies  Allergen Reactions  . Na Ferric Gluc Cplx In Sucrose Anaphylaxis  . Cymbalta [Duloxetine Hcl] Swelling and Anxiety  . Hydromorphone Other (See Comments)    BP drop and heart rate drops  . Succinylcholine Other (See Comments)    Lock Jaw  . Ativan [Lorazepam] Rash and Other (See Comments)    Dystonia  . Buprenorphine Hcl Hives  . Compazine Other (See Comments)    Altered mental status Aggression  . Metoclopramide Other (See Comments)    Dystonia  . Morphine And Related Hives  . Ondansetron Hives and Rash  . Promethazine Hcl Hives  . Tegaderm Ag Mesh [Silver] Rash    Old formulation only, is able to tolerate new formulation    OBJECTIVE: Blood pressure 116/76, pulse 84, temperature 98 F (36.7 C), temperature source Oral, resp. rate 16, height _0  (1.651 m), weight 80.9 kg, SpO2 100 %.  Physical Exam Constitutional:      Comments: She is sitting up in bed eating breakfast. More awake today.   Cardiovascular:     Rate and Rhythm: Normal rate.  Pulmonary:     Effort: Pulmonary effort is normal.  Chest:    Musculoskeletal:     Comments: Original OR bulky surgical dressing left hand through forearm remains. No drainage on dressings.   Psychiatric:        Mood and Affect: Mood normal.     Lab Results Lab Results  Component Value Date   WBC 8.9 12/23/2018   HGB 11.8 (L)  12/23/2018   HCT 38.1 12/23/2018   MCV 90.5 12/23/2018   PLT 256 12/23/2018    Lab Results  Component Value Date   CREATININE 0.94 12/25/2018   BUN 15 12/25/2018   NA 140 12/25/2018   K 3.9 12/25/2018   CL 106 12/25/2018   CO2 27 12/25/2018    Lab Results  Component Value Date   ALT 32 11/19/2018   AST 26 11/19/2018   ALKPHOS 58 11/19/2018   BILITOT 0.1 (L) 11/19/2018     Microbiology: Recent Results (from the  past 240 hour(s))  Aerobic/Anaerobic Culture (surgical/deep wound)     Status: None (Preliminary result)   Collection Time: 12/23/18  3:41 PM  Result Value Ref Range Status   Specimen Description ABSCESS  Final   Special Requests LEFT INDEX FINGER SAMPLE A  Final   Gram Stain NO WBC SEEN NO ORGANISMS SEEN   Final   Culture   Final    NO GROWTH 2 DAYS NO ANAEROBES ISOLATED; CULTURE IN PROGRESS FOR 5 DAYS Performed at [facility], [address], [place], [place] [address]    Report Status PENDING  Incomplete  Aerobic/Anaerobic Culture (surgical/deep wound)     Status: None (Preliminary result)   Collection Time: 12/23/18  3:53 PM  Result Value Ref Range Status   Specimen Description ABSCESS  Final   Special Requests LEFT INDEX FINGER SAMPLE B  Final   Gram Stain NO WBC SEEN NO ORGANISMS SEEN   Final   Culture   Final    NO GROWTH 2 DAYS NO ANAEROBES ISOLATED; CULTURE IN PROGRESS FOR 5 DAYS Performed at [facility], [place] [address], [place], [place] [address]    Report Status PENDING  Incomplete     Janene Madeira, MSN, NP-C [facility] for Infectious Disease [email or web address] Pager: [phone] Office: [phone] [place]: [phone]

## 2018-12-25 NOTE — Discharge Summary (Signed)
Physician Discharge Summary   Patient ID: [name] MRN: [id number] DOB/AGE: 1971/05/04 48 y.o.  Admit date: 12/23/2018 Discharge date: 12/25/2018  Primary Care Physician:  Susan Grand, MD   Recommendations for Outpatient Follow-up:  1. Patient recommended to follow-up with Dr. Amedeo White per instructions 2. Cultures so far negative after 48 hours however final cultures will be followed by ID.  Home Health: None Equipment/Devices:   Discharge Condition: stable  CODE STATUS: FULL  Diet recommendation: Heart healthy diet   Discharge Diagnoses:     . Left second metacarpal cellulitis with crushing injury status post I&D . Hypertension . History of migraines . Adrenal insufficiency ([place]) . Thyroid cancer ([place]) . Breast cancer ([place]) . Hodgkin lymphoma ([place]) . Hyperlipidemia . Raynaud phenomenon . History of nonischemic cardiomyopathy .  Pulmonary hypertension ([place]) . Chronic respiratory failure with hypoxia ([place]) . Injury of left index finger   Consults: Infectious disease Hand surgery, Dr. Amedeo White    Allergies:   Allergies  Allergen Reactions  . Na Ferric Gluc Cplx In Sucrose Anaphylaxis  . Cymbalta [Duloxetine Hcl] Swelling and Anxiety  . Hydromorphone Other (See Comments)    BP drop and heart rate drops  . Succinylcholine Other (See Comments)    Lock Jaw  . Ativan [Lorazepam] Rash and Other (See Comments)    Dystonia  . Buprenorphine Hcl Hives  . Compazine Other (See Comments)    Altered mental status Aggression  . Metoclopramide Other (See Comments)    Dystonia  . Morphine And Related Hives  . Ondansetron Hives and Rash  . Promethazine Hcl Hives  . Tegaderm Ag Mesh [Silver] Rash    Old formulation only, is able to tolerate new formulation     DISCHARGE MEDICATIONS: Allergies as of 12/25/2018      Reactions   Na Ferric Gluc Cplx In Sucrose Anaphylaxis   Cymbalta [duloxetine Hcl] Swelling, Anxiety   Hydromorphone Other (See Comments)   BP  drop and heart rate drops   Succinylcholine Other (See Comments)   Lock Jaw   Ativan [lorazepam] Rash, Other (See Comments)   Dystonia   Buprenorphine Hcl Hives   Compazine Other (See Comments)   Altered mental status Aggression   Metoclopramide Other (See Comments)   Dystonia   Morphine And Related Hives   Ondansetron Hives, Rash   Promethazine Hcl Hives   Tegaderm Ag Mesh [silver] Rash   Old formulation only, is able to tolerate new formulation      Medication List    STOP taking these medications   cephALEXin 500 MG capsule Commonly known as:  KEFLEX     TAKE these medications   amoxicillin-clavulanate 875-125 MG tablet Commonly known as:  AUGMENTIN Take 1 tablet by mouth 2 (two) times daily for 7 days.   beclomethasone 80 MCG/ACT inhaler Commonly known as:  QVAR Inhale 2 puffs into the lungs 2 (two) times daily.   Breo Ellipta 200-25 MCG/INH Aepb Generic drug:  fluticasone furoate-vilanterol Inhale 1 puff into the lungs daily.   [place] 50 MG Pack Generic drug:  Diclofenac Potassium Take 1 packet by mouth 3 (three) times daily as needed (migraines).   clonazePAM 1 MG tablet Commonly known as:  KLONOPIN Take 1 mg by mouth 3 (three) times daily.   Dexilant 60 MG capsule Generic drug:  dexlansoprazole Take 60 mg by mouth daily.   diphenhydrAMINE 50 MG/ML injection Commonly known as:  BENADRYL Inject 1 mL (50 mg total) into the vein every 6 (six) hours as  needed (nausea).   doxycycline 100 MG tablet Commonly known as:  VIBRA-TABS Take 1 tablet (100 mg total) by mouth 2 (two) times daily for 7 days.   escitalopram 20 MG tablet Commonly known as:  LEXAPRO Take 20 mg by mouth daily.   estradiol 2 MG tablet Commonly known as:  ESTRACE Take 2 mg by mouth daily.   fluconazole 150 MG tablet Commonly known as:  DIFLUCAN Take 150 mg by mouth daily. For ten days   furosemide 20 MG tablet Commonly known as:  LASIX Take 20 mg by mouth 2 (two) times daily.    gi cocktail Susp suspension Take 30 mLs daily as needed by mouth for indigestion (BLEEDING ULCERS). Shake well.   hydrocortisone 10 MG tablet Commonly known as:  CORTEF Take 5-10 mg by mouth See admin instructions. Take 10 mg in the am and 5 mg in the pm   HYDROmorphone 4 MG tablet Commonly known as:  DILAUDID Take 1 tablet (4 mg total) by mouth every 6 (six) hours as needed for up to 7 days for severe pain.   levothyroxine 112 MCG tablet Commonly known as:  SYNTHROID, LEVOTHROID Take 112 mcg by mouth daily before breakfast.   methocarbamol 500 MG tablet Commonly known as:  ROBAXIN Take 1 tablet (500 mg total) by mouth every 6 (six) hours as needed for muscle spasms.   metoprolol succinate 25 MG 24 hr tablet Commonly known as:  TOPROL-XL Take 37.5 mg by mouth daily.   OVER THE COUNTER MEDICATION Take 500 mg by mouth daily. Tumeric cucurmin   potassium chloride 10 MEQ tablet Commonly known as:  K-DUR Take 1 tablet (10 mEq total) by mouth daily.   rosuvastatin 10 MG tablet Commonly known as:  CRESTOR Take 10 mg by mouth daily.   senna-docusate 8.6-50 MG tablet Commonly known as:  Senokot-S Take 1 tablet by mouth 2 (two) times daily. For constipation   Slow Release Iron 45 MG Tbcr Generic drug:  Ferrous Sulfate Dried Take 1 tablet at bedtime by mouth.   sucralfate 1 g tablet Commonly known as:  CARAFATE Take 1 g as needed by mouth (FOR ULCERS).   topiramate 100 MG tablet Commonly known as:  TOPAMAX Take 150 mg by mouth at bedtime.   traMADol 50 MG tablet Commonly known as:  ULTRAM Take 50 mg by mouth daily as needed (migraine).   Ventolin HFA 108 (90 Base) MCG/ACT inhaler Generic drug:  albuterol Inhale 2 puffs into the lungs every 6 (six) hours as needed for wheezing or shortness of breath.   zolpidem 12.5 MG CR tablet Commonly known as:  AMBIEN CR Take 12.5 mg by mouth at bedtime.        Brief H and P: For complete details please refer to admission  H and P, but in brief [name] a 48 y.o.WF PMHxstage IVHodgkin lymphoma(15 years in remission), Breast cancerDCIS (multiple recurrences s/p bilateral mastectomy now in remission), pituitary adenoma s/p resection, thyroid cancer,cervical cancer s/pLEEP procedure,ovarian cancer s/p hysterectomy,chronicAdrenal insufficiency,Hypertension,Hyperlipidemia, ChronicSystolic CHFwith(Right heart failure )?,Nephrolithiasis,fibroelastosis of lung withPHTN, Chronic Respiratory failure with hypoxia on 3-4L O2 at home,lung nodules, kidney nodules, liver nodules, brain nodule believed to be secondary to treatment for her breast cancer being monitored by [facility]. Patient recently discharged on 11/20/2018 for RIGHT third finger ulceration with cellulitis s/p surgery by Dr. 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 Patient  was seen by ID and pulmonology and placed on antibiotics and antivirals. During her stay s/p bronchoscopy unrevealing. Followed by pulmonology at [facility]. Also during stay patient subacute Hx blisters developing and bilateral hand and arm swelling over last 3 months. Autoimmune work-up unrevealing except for nonspecific elevated sedimentation rate and rheumatoid factor. Was supposed to follow-up with rheumatology.  She was discharged on Levaquin.  On 11/22/2018, patient hit her left index fingerin a bathroom door rather violently. She had immediate blistering to the finger and subsequently tried to pop this with 3 or so attempts with a needle at home. She began having more swelling and pain and was seen by Dr. Amedeo White and was placed on Keflex.  Due to concerns for infectious tenosynovitis, patient was admitted by Dr. Amedeo White for OR.     Hospital Course:  LEFT second metacarpal cellulitis, with a crushing  injury -Patient was seen by Dr. Amedeo White, underwent exploration with irrigation and debridement. -Cultures so far negative, patient has multiple allergies as well as recent courses of Levaquin and Keflex - Patient was placed on IV vancomycin and Zosyn, also has a port.    ID was consulted, recommended, patient was followed by Dr. Bridget Hartshorn who recommended Augmentin and doxycycline for 1 week.  Cultures will be followed by ID. - Dressing change with instructions per Dr [name] done prior to discharge. She has follow-up appointment on Monday with Dr [name].   Thyroid cancer s/p thyroidectomy - Continue levothyroxine   Adrenal insufficiency - Continue Cortef 10 mg a.m., 5 mg p.m.   History of nonischemic cardiomyopathy, pulmonary hypertension -2D echo 11/09/2018 had shown EF of 60 to 82%, normal systolic function. -Continue Lasix, Toprol-XL, potassium replacement  Chronic respiratory failure with hypoxia -Patient's home regimen is 3 to 4 L O2 via nasal cannula -Continue O2 supplementation, Qvar  Hyperlipidemia Continue Crestor 10 mg daily  History of migraines Continue Topamax  Insomnia Continue Ambien 12.5 mg CR, patient has brought medication from home   GERD Continue PPI Patient reports that only IV Benadryl works for her nausea   Day of Discharge S: No acute issues, awaiting dressing change. No fevers    BP 116/76 (BP Location: Right Arm)   Pulse 84   Temp 98 F (36.7 C) (Oral)   Resp 16   Ht _0  (1.651 m)   Wt 80.9 kg   LMP  (LMP Unknown)   SpO2 100%   BMI 29.69 kg/m   Physical Exam: General: Alert and awake oriented x3 not in any acute distress. HEENT: anicteric sclera, pupils reactive to light and accommodation CVS: S1-S2 clear no murmur rubs or gallops Chest: clear to auscultation bilaterally, no wheezing rales or rhonchi Abdomen: soft nontender, nondistended, normal bowel sounds Extremities:  dressing intact on the left forearm and hand Neuro:  Cranial nerves II-XII intact, no focal neurological deficits   The results of significant diagnostics from this hospitalization (including imaging, microbiology, ancillary and laboratory) are listed below for reference.      Procedures/Studies:  No results found.    LAB RESULTS: Basic Metabolic Panel: Recent Labs  Lab 12/24/18 0412 12/25/18 0412  NA 137 140  K 3.7 3.9  CL 106 106  CO2 24 27  GLUCOSE 155* 106*  BUN 13 15  CREATININE 1.19* 0.94  CALCIUM 8.6* 8.7*  MG 1.9  --    Liver Function Tests: No results for input(s): AST, ALT, ALKPHOS, BILITOT, PROT, ALBUMIN in the last 168 hours. No results for input(s): LIPASE, AMYLASE in  the last 168 hours. No results for input(s): AMMONIA in the last 168 hours. CBC: Recent Labs  Lab 12/23/18 1356  WBC 8.9  HGB 11.8*  HCT 38.1  MCV 90.5  PLT 256   Cardiac Enzymes: No results for input(s): CKTOTAL, CKMB, CKMBINDEX, TROPONINI in the last 168 hours. BNP: Invalid input(s): POCBNP CBG: No results for input(s): GLUCAP in the last 168 hours.    Disposition and Follow-up: Discharge Instructions    Diet - low sodium heart healthy   Complete by:  As directed    Increase activity slowly   Complete by:  As directed        DISPOSITION: [facility]    Roseanne Kaufman, MD Follow up in 1 day(s).   Specialty:  Orthopedic Surgery Contact information: [address] [place] [address] [phone]            Time coordinating discharge:  35 minutes  Signed:   Estill Cotta M.D. [facility] 12/25/2018, 5:45 PM

## 2018-12-25 NOTE — Progress Notes (Signed)
RT has came by to see this patient twice within the last hour. First time she declined treatment to eat and the second time her physicians were in with her. RT will come back and try again during second rounds.

## 2018-12-25 NOTE — Progress Notes (Signed)
Discharge instructions completed by michelle.  Pt verbalized understanding of the information.  Pt denies chest pain, shortness of breath, dizziness, lightheadedness, and n/v.  Pt discharged home.

## 2018-12-25 NOTE — Progress Notes (Signed)
VAST RN at pt bedside when she began crying and threw her discharge papers across the room and said since she didn't have a choice VAST RN could de-access her port. VAST RN stepped into hall to gather supplies and when VAST RN returned pt had dressing off needle and was about to de-access the port herself. VAST RN requested that saline flush and heparin flush be allowed before de-accessing to keep her port working appropriately. Pt allowed VAST RN to flush line with 5 mLs then requested VAST RN tell unit RN to give her a dose of Benadryl and a dose of Dilaudid. After unit RN administered meds, VAST RN flushed port with NS and heparin flush as charted in MAR. Due to her inability to use her casted left hand to remove the needle herself, she allowed the VAST RN to de-access port and place Band-aid over site. She then thanked both unit RN and VAST RN for attempting to help.

## 2018-12-25 NOTE — Progress Notes (Signed)
[facility]                                                                              Patient Demographics  [name], is a 48 y.o. female, DOB - [birth year], UPJ:[id number]  Admit date - 12/23/2018   Admitting Physician Allie Bossier, MD  Outpatient Primary MD for the patient is Toppin, Antionette Poles, MD  Outpatient specialists:   LOS - 2  days   Medical records reviewed and are as summarized below:    Chief Complaint  Patient presents with  . Hand Injury       Brief summary   [name] is a 48 y.o.  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 Patient recently discharged on 11/20/2018 for RIGHT third finger ulceration with cellulitis s/p surgery by Dr. Roseanne Kaufman orthopedic hand surgery, LUL pneumonia with acute on chronic respiratory failure with concern for focal alveolar hemorrhage secondary to vasculitis vs pneumonic process with small volume hemoptysis.  Patient was seen by ID and pulmonology and placed on antibiotics and antivirals.  During her stay s/p bronchoscopy unrevealing.  Followed by pulmonology at [facility].  Also during stay patient subacute Hx blisters developing and bilateral hand and arm swelling over last 3 months.  Autoimmune work-up unrevealing except for nonspecific elevated sedimentation rate and rheumatoid factor.  Was supposed to follow-up with rheumatology.  She was  discharged on Levaquin.  On 11/22/2018, patient hit her left index finger in a bathroom door rather violently. She had immediate blistering to the finger and subsequently tried to pop this with 3 or so attempts with a needle at home. She began having more swelling and pain and was seen by Dr. Amedeo Plenty and was placed on Keflex.  Due to concerns for infectious tenosynovitis, patient was admitted by Dr. Amedeo Plenty for OR.    Assessment & Plan    LEFT second metacarpal cellulitis, with a crushing injury -Patient was seen by Dr. Amedeo Plenty, underwent exploration with irrigation and debridement. -Management per hand surgery -Cultures so far negative, patient has multiple allergies as well as recent courses of Levaquin and Keflex - Patient was placed on IV vancomycin and Zosyn, also has a port.  Consulted ID, Dr. Megan Salon.  -Antibiotics transitioned to oral Augmentin and doxycycline, cultures remain negative  - Awaiting hand surgery dressing change and instructions prior to discharge   Thyroid cancer s/p thyroidectomy - Continue levothyroxine   Adrenal insufficiency - Continue Cortef 10 mg a.m., 5 mg p.m.   History of nonischemic cardiomyopathy, pulmonary hypertension -2D echo 11/09/2018 had shown EF of 60 to 92%, normal systolic function. -Continue Lasix, Toprol-XL, potassium replacement - no acute issues   Chronic respiratory  failure with hypoxia -Patient's home regimen is 3 to 4 L O2 via nasal cannula -Continue O2 supplementation, Qvar  Hyperlipidemia Continue Crestor 10 mg daily  History of migraines Continue Topamax  Insomnia Continue Ambien 12.5 mg CR, patient has brought medication from home   GERD Continue PPI Patient reports that only IV Benadryl works for her nausea   Code Status: Full CODE STATUS DVT Prophylaxis: SCDs Family Communication: Discussed in detail with the patient, all imaging results, lab results explained to the patient    Disposition Plan:  after  dressing change  Time Spent in minutes 25 minutes*   Procedures:  12/23/2018:  # 1volar finger incision with flexor tendon exploration and tenolysis tenosynovectomy with cultures taken.    #2 dorsal incision and extensor tendon exploration with irrigation debridement of the skin and de-epithelialized tissue.  This was an extensor tendon tenolysis tenosynovectomy and exploration  Consultants:   Hand surgery Infectious disease  Antimicrobials:   Anti-infectives (From admission, onward)   Start     Dose/Rate Route Frequency Ordered Stop   12/25/18 0000  amoxicillin-clavulanate (AUGMENTIN) 875-125 MG tablet  Status:  Discontinued     1 tablet Oral 2 times daily 12/25/18 0853 12/25/18    12/25/18 0000  doxycycline (VIBRA-TABS) 100 MG tablet  Status:  Discontinued     100 mg Oral 2 times daily 12/25/18 0853 12/25/18    12/25/18 0000  amoxicillin-clavulanate (AUGMENTIN) 875-125 MG tablet  Status:  Discontinued     1 tablet Oral 2 times daily 12/25/18 0959 12/25/18    12/25/18 0000  doxycycline (VIBRA-TABS) 100 MG tablet  Status:  Discontinued     100 mg Oral 2 times daily 12/25/18 0959 12/25/18    12/25/18 0000  amoxicillin-clavulanate (AUGMENTIN) 875-125 MG tablet     1 tablet Oral 2 times daily 12/25/18 1120 01/01/19 2359   12/25/18 0000  doxycycline (VIBRA-TABS) 100 MG tablet     100 mg Oral 2 times daily 12/25/18 1120 01/01/19 2359   12/24/18 1800  vancomycin (VANCOCIN) 1,500 mg in sodium chloride 0.9 % 500 mL IVPB  Status:  Discontinued     1,500 mg 250 mL/hr over 120 Minutes Intravenous Every 24 hours 12/23/18 1805 12/24/18 1342   12/24/18 1345  amoxicillin-clavulanate (AUGMENTIN) 875-125 MG per tablet 1 tablet     1 tablet Oral Every 12 hours 12/24/18 1342     12/24/18 1345  doxycycline (VIBRA-TABS) tablet 100 mg     100 mg Oral Every 12 hours 12/24/18 1342     12/23/18 1900  vancomycin (VANCOCIN) 500 mg in sodium chloride 0.9 % 100 mL IVPB     500 mg 100 mL/hr over 60 Minutes  Intravenous  Once 12/23/18 1805 12/23/18 2127   12/23/18 1800  fluconazole (DIFLUCAN) tablet 150 mg     150 mg Oral Daily 12/23/18 1401 01/01/19 0959   12/23/18 1600  piperacillin-tazobactam (ZOSYN) IVPB 3.375 g     3.375 g 100 mL/hr over 30 Minutes Intravenous To Surgery 12/23/18 1554 12/23/18 1603         Medications  Scheduled Meds: . amoxicillin-clavulanate  1 tablet Oral Q12H  . aspirin EC  81 mg Oral Daily  . budesonide (PULMICORT) nebulizer solution  0.25 mg Nebulization BID  . clonazePAM  1 mg Oral TID  . dexlansoprazole  60 mg Oral Daily  . doxycycline  100 mg Oral Q12H  . escitalopram  20 mg Oral Daily  . estradiol  2 mg Oral Daily  . fluconazole  150 mg Oral Daily  . fluticasone furoate-vilanterol  1 puff Inhalation Daily  . furosemide  20 mg Oral BID  . heparin  5,000 Units Subcutaneous Q8H  . hydrocortisone  20 mg Oral Daily   And  . hydrocortisone  10 mg Oral QPC lunch  . [START ON 12/27/2018] hydrocortisone  10 mg Oral Daily  . [START ON 12/26/2018] hydrocortisone  5 mg Oral QPC lunch  . levothyroxine  112 mcg Oral QAC breakfast  . metoprolol succinate  37.5 mg Oral Daily  . potassium chloride  10 mEq Oral Daily  . rosuvastatin  10 mg Oral Daily  . scopolamine  1 patch Transdermal Q72H  . senna-docusate  1 tablet Oral BID  . sodium chloride flush  10-40 mL Intracatheter Q12H  . topiramate  150 mg Oral QHS  . traMADol  100 mg Oral Q6H  . zolpidem  10 mg Oral QHS   Continuous Infusions: . methocarbamol (ROBAXIN) IV     PRN Meds:.albuterol, diclofenac, diphenhydrAMINE, gi cocktail, HYDROmorphone (DILAUDID) injection, HYDROmorphone, methocarbamol (ROBAXIN) IV, methocarbamol, sodium chloride flush, sucralfate      Subjective:   [name] was seen and examined today.  No acute issues. No fevers. Awaiting dressing change. Patient denies dizziness, chest pain, shortness of breath, abdominal pain, N/V/D/C, new weakness, numbess, tingling. No acute events  overnight.    Objective:   Vitals:   12/24/18 1428 12/24/18 1934 12/24/18 1951 12/25/18 0453  BP: 109/68  105/67 116/76  Pulse: 83  83 84  Resp: _0 Temp: 98.5 F (36.9 C)  97.8 F (36.6 C) 98 F (36.7 C)  TempSrc: Oral  Oral Oral  SpO2: 96% 96% 98% 100%  Weight:      Height:        Intake/Output Summary (Last 24 hours) at 12/25/2018 1554 Last data filed at 12/25/2018 0900 Gross per 24 hour  Intake 730 ml  Output 650 ml  Net 80 ml     Wt Readings from Last 3 Encounters:  12/24/18 80.9 kg  11/19/18 80 kg  04/02/18 79.8 kg   Physical Exam  General: Alert and oriented x 3, NAD  Eyes:   HEENT:    Cardiovascular: S1 S2 clear,  RRR. No pedal edema b/l  Respiratory: CTAB, no wheezing, rales or rhonchi  Gastrointestinal: Soft, nontender, nondistended, NBS  Ext: no pedal edema bilaterally  Neuro: no new deficits  Musculoskeletal: No cyanosis, clubbing  Skin: dressing intact left forearm  Psych: Normal affect and demeanor, alert and oriented x3       Data Reviewed:  I have personally reviewed following labs and imaging studies  Micro Results Recent Results (from the past 240 hour(s))  Aerobic/Anaerobic Culture (surgical/deep wound)     Status: None (Preliminary result)   Collection Time: 12/23/18  3:41 PM  Result Value Ref Range Status   Specimen Description ABSCESS  Final   Special Requests LEFT INDEX FINGER SAMPLE A  Final   Gram Stain NO WBC SEEN NO ORGANISMS SEEN   Final   Culture   Final    NO GROWTH 2 DAYS NO ANAEROBES ISOLATED; CULTURE IN PROGRESS FOR 5 DAYS Performed at [facility], [address] [place], [place] [address]    Report Status PENDING  Incomplete  Aerobic/Anaerobic Culture (surgical/deep wound)     Status: None (Preliminary result)   Collection Time: 12/23/18  3:53 PM  Result Value Ref Range Status   Specimen Description ABSCESS  Final  Special Requests LEFT INDEX FINGER SAMPLE B  Final   Gram Stain NO WBC  SEEN NO ORGANISMS SEEN   Final   Culture   Final    NO GROWTH 2 DAYS NO ANAEROBES ISOLATED; CULTURE IN PROGRESS FOR 5 DAYS Performed at [facility], [address], [place], [place] [address]    Report Status PENDING  Incomplete    Radiology Reports No results found.  Lab Data:  CBC: Recent Labs  Lab 12/23/18 1356  WBC 8.9  HGB 11.8*  HCT 38.1  MCV 90.5  PLT 323   Basic Metabolic Panel: Recent Labs  Lab 12/23/18 1356 12/24/18 0412 12/25/18 0412  NA 136 137 140  K 3.2* 3.7 3.9  CL 108 106 106  CO2 21* 24 27  GLUCOSE 149* 155* 106*  BUN _0 CREATININE 1.03* 1.19* 0.94  CALCIUM 8.5* 8.6* 8.7*  MG 1.9 1.9  --    GFR: Estimated Creatinine Clearance: 77.8 mL/min (by C-G formula based on SCr of 0.94 mg/dL). Liver Function Tests: No results for input(s): AST, ALT, ALKPHOS, BILITOT, PROT, ALBUMIN in the last 168 hours. No results for input(s): LIPASE, AMYLASE in the last 168 hours. No results for input(s): AMMONIA in the last 168 hours. Coagulation Profile: No results for input(s): INR, PROTIME in the last 168 hours. Cardiac Enzymes: No results for input(s): CKTOTAL, CKMB, CKMBINDEX, TROPONINI in the last 168 hours. BNP (last 3 results) No results for input(s): PROBNP in the last 8760 hours. HbA1C: No results for input(s): HGBA1C in the last 72 hours. CBG: No results for input(s): GLUCAP in the last 168 hours. Lipid Profile: No results for input(s): CHOL, HDL, LDLCALC, TRIG, CHOLHDL, LDLDIRECT in the last 72 hours. Thyroid Function Tests: No results for input(s): TSH, T4TOTAL, FREET4, T3FREE, THYROIDAB in the last 72 hours. Anemia Panel: No results for input(s): VITAMINB12, FOLATE, FERRITIN, TIBC, IRON, RETICCTPCT in the last 72 hours. Urine analysis:    Component Value Date/Time   COLORURINE YELLOW 11/13/2018 0520   APPEARANCEUR CLOUDY (A) 11/13/2018 0520   LABSPEC 1.017 11/13/2018 0520   PHURINE 8.0 11/13/2018 0520   GLUCOSEU NEGATIVE  11/13/2018 0520   HGBUR NEGATIVE 11/13/2018 0520   BILIRUBINUR NEGATIVE 11/13/2018 0520   KETONESUR NEGATIVE 11/13/2018 0520   PROTEINUR NEGATIVE 11/13/2018 0520   UROBILINOGEN 1.0 06/16/2015 1045   NITRITE NEGATIVE 11/13/2018 0520   LEUKOCYTESUR NEGATIVE 11/13/2018 0520     Taysen Bushart M.D. [facility] 12/25/2018, 3:54 PM  Pager: [phone] Between 7am to 7pm - call Pager - [phone]  After 7pm go to [email or web address] - password [email or web address]  Call night coverage person covering after 7pm

## 2018-12-25 NOTE — Plan of Care (Signed)
  Problem: Pain Managment: Goal: General experience of comfort will improve Outcome: Progressing   Problem: Safety: Goal: Ability to remain free from injury will improve Outcome: Progressing

## 2018-12-25 NOTE — Progress Notes (Signed)
Pt became agitated when RN told her that we could not let her leave with her port accessed because it is against [facility] policy and had no doctors orders to leave it in for home. Pt at first refused to have it taken out. Pt was informed that if she tried to leave with it accessed RN would have to inform [employer]. Pt called husband and informed him of situation because he was sitting in car outside to pick her up. Pt then proceeded to walk to bed and threw her discharge papers across the room and said the VAST RN could de-access her port. Pt gathered all of her belongings herself and refused help by staff when offered to carry them down. Pt given cart and placed all of her belongings on the cart to be pushed down. RN accompanied her down and pt got into care with husband.

## 2018-12-28 LAB — AEROBIC/ANAEROBIC CULTURE W GRAM STAIN (SURGICAL/DEEP WOUND)
Culture: NO GROWTH
Culture: NO GROWTH
Gram Stain: NONE SEEN

## 2018-12-28 LAB — AEROBIC/ANAEROBIC CULTURE (SURGICAL/DEEP WOUND): GRAM STAIN: NONE SEEN

## 2018-12-29 ENCOUNTER — Encounter (HOSPITAL_COMMUNITY): Payer: Self-pay | Admitting: Orthopedic Surgery

## 2018-12-30 ENCOUNTER — Inpatient Hospital Stay: Payer: Self-pay | Admitting: Pulmonary Disease

## 2019-01-01 LAB — ACID FAST CULTURE WITH REFLEXED SENSITIVITIES (MYCOBACTERIA): Acid Fast Culture: NEGATIVE

## 2019-01-29 ENCOUNTER — Other Ambulatory Visit: Payer: Self-pay

## 2019-01-29 ENCOUNTER — Encounter (HOSPITAL_COMMUNITY): Payer: Self-pay | Admitting: *Deleted

## 2019-01-29 NOTE — Progress Notes (Signed)
Anesthesia Chart Review: SAME DAY WORK-UP   Case:  [id number] Date/Time:  01/30/19 1030   Procedure:  Left Index finger amputation with flap reconstruction and repair reconstruction as necessary (Left ) - 65mn   Anesthesia type:  Choice   Pre-op diagnosis:  Left index finger crush injury   [facility] [place]   Surgeon:  GRoseanne Kaufman MD      DISCUSSION: Patient is a 48year old female scheduled for the above procedure. S/p recent left finger surgery last month for crush injury.   History includes never smoker, post-operative N/V, stage IV Hodgkin lymphoma (diagnosed at age 48 s/p chemotherapy and mantle field radiotherapy), breast cancer DCIS (s/p bilateral mastectomies 53/84/66 silicone implants 159/9357with exchange 06/23/12; chest wall recurrence 2001, s/p chemotherapy), cervical cancer (s/p LEEP procedure 2002), ovarian tumor (s/p TAH, left oophorectomy 2011, right oophorectomy 2016), pituitary adenoma (s/p resection 2002, s/p gamma knife surgery for regrowth 2015), post-surgical adrenal insufficiency, thyroid cancer (s/p total thyroidectomy 2010), chronic hypoxemic respiratory failure (3-4L/[place] home O2), Raynaud phenomenon, GERD, tachycardia/SVT/palpitations, murmur, pre-diabetic, HTN,   - Chest pain, aortic stenosis, CHF with right heart failure, and pulmonary hypertension are listed in her history. Cardiologist Dr. 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  She had a RHC/LHC on 04/02/18 that showed normal coronaries and normal right and left heart pressures. EF normalized by 11/2018 echo. There was no AS by 08/2018 echo.   [facility] left 2nd finger cellulitis following crush injury. She underwent volar finger incision with flexor tendon exploration and tenolysis tenosynovectomy and dorsal  incision and extensor tendon exploration with irrigation debridement 12/23/18.  [facility] 3rd [facility] cellulitis. She had had ongoing bilateral hand edema with previous work-up including a CTA chest 09/04/18 that showed no PE with stable "likely benign" lung nodules. She did have thrombus in the superficial left distal cephalic vein. No evidence of obstruction of the SVC or central veins on 08/26/18 CTA. MRI showed no evidence of osteomyelitis or abscess. ID [place] and suspected a vasculitis process and recommended biopsy. Left forearm biopsy per general surgery only showed epidermal ulceration suggestive of trauma or excoriation. Pulmonology consulted for hemoptysis and left lung infiltrate and underwent bronchoscopy 11/14/18 for evaluation of hemoptysis and on 11/19/18 for worsengin LUL density. No malignancy seen on bronchial brushing specimens. There is mention of out-patient rheumatology and hematology referrals.   Patient is a same day work-up, so further evaluation on the day of surgery. She has adrenal insufficiency and reports need for stress dose steroids ("IV Cortef") prior to surgery.    PROVIDERS: Toppin, JAntionette Poles MD is PCP  K[facility]aria Good [facility]  NMertie Moores MD is cardiologist. Last visit 10/27/18. No new changes with six month follow-up recommended.  EJulious Payer MD is pulmonologist ([facility], but [facility]    LABS: She is for updated labs on the day of surgery.   IMAGES: 1V CXR 11/17/18: FINDINGS: The infiltrate in the lateral left mid lung is similar since yesterday but increased since November 13, 2018. No pneumothorax. Stable right Port-A-Cath. The cardiomediastinal silhouette is unchanged. IMPRESSION: Stable infiltrate in the periphery of the left mid lung, similar in the interval. In the appropriate clinical setting, this would be consistent with  pneumonia.  CT Chest 11/10/18: IMPRESSION: 1. No evidence thoracic metastasis. 2. No change from comparison exam.  PET Scan 09/12/18 ([facility]): Impression:  No evidence of FDG avid disease in the chest, abdomen, or pelvis.   EKG: 04/02/18: ST at 102 bpm   CV: Echo (Limited) 11/09/18: IMPRESSIONS  1. The left ventricle has normal systolic function of 13-08%. The cavity size was normal. There is no increased left ventricular wall thickness. Left ventricular diastology could not be evaluated due to nondiagnostic images.  2. The right ventricle has normal systolic function. The cavity was normal . There is no increase in right ventricular wall thickness.  3. There is mild to moderate mitral annular calcification present.  4. The tricuspid valve was normal in structure. Tricuspid valve regurgitation is trivial by color flow Doppler.  5. The aortic valve is tricuspid There is mild thickening and moderate calcification of the aortic valve. Aortic valve regurgitation is mild to moderate by color flow Doppler.  6. The pulmonic valve was normal in structure.  Echo 09/03/18 ([facility]): SUMMARY: NORMAL LEFT VENTRICULAR SYSTOLIC FUNCTION  NORMAL LA PRESSURES WITH NORMAL DIASTOLIC FUNCTION  NORMAL RIGHT VENTRICULAR SYSTOLIC FUNCTION  VALVULAR REGURGITATION: MILD AR, TRIVIAL MR, TRIVIAL TR  NO VALVULAR STENOSIS  RHC/LHC 04/02/18: 1. No angiographic evidence of CAD 2. Right and left heart pressures are in a normal range.  3. Non-ischemic cardiomyopathy Recommendations: Medical management of her non-ischemic cardiomyopathy   Past Medical History:  Diagnosis Date  . Addison's disease ([place])   . Adrenal insufficiency ([place])   . Anemia   . Anxiety   . Aortic stenosis   . Aortic stenosis   . Appendicitis 12/19/09  . Breast cancer ([place])    STATUS POST BILATERAL MASTECTOMY. STATUS POST RECONSTRUCTION. SHE HAD SILICONE BREAST IMPLANTS AND THE LEFT IMPLANT IS LEAKING SLIGHTLY  . Cervical cancer  ([place]) 12/23/2018  . Chest pain   . CHF with right heart failure ([place]) 04/17/2017  . Chronic respiratory failure with hypoxia ([place]) 12/23/2018  . Depression   . GERD (gastroesophageal reflux disease)   . Headache    migraines  . Heart murmur   . History of kidney stones   . Hodgkin lymphoma ([facility])    STATUS POST MANTLE RADIATION  . Hodgkin's lymphoma ([place])    1987  . Hypertension   . Hypoxia   . Osteoporosis   . Palpitations   . Pituitary adenoma ([place]) 12/23/2018  . Pneumonia   . PONV (postoperative nausea and vomiting)   . Pre-diabetes    per pt; no meds  . Pulmonary hypertension ([place]) 12/23/2018  . Raynaud phenomenon   . Supplemental oxygen dependent    3 liters  . SVT (supraventricular tachycardia) ([place])   . Tachycardia   . Thyroid cancer ([place])    STATUS POST SURGICAL REMOVAL-CURRENT ON THYROID REPLACEMENT    Past Surgical History:  Procedure Laterality Date  . ABDOMINAL HYSTERECTOMY    . APPENDECTOMY    . CARDIAC CATHETERIZATION  05/18/09   NORMAL CATH  . I&D EXTREMITY Left 12/23/2018   Procedure: IRRIGATION AND DEBRIDEMENT HAND / INDEX FINGER;  Surgeon: Roseanne Kaufman, MD;  Location: [place];  Service: Orthopedics;  Laterality: Left;  Marland Kitchen MASTECTOMY    . PITUITARY SURGERY    . RIGHT/LEFT HEART CATH AND CORONARY ANGIOGRAPHY N/A 04/02/2018   Procedure: RIGHT/LEFT HEART CATH AND CORONARY ANGIOGRAPHY;  Surgeon: Burnell Blanks, MD;  Location: [facility] [facility];  Service: Cardiovascular;  Laterality: N/A;  . TOTAL  THYROIDECTOMY    . VIDEO BRONCHOSCOPY Bilateral 11/14/2018   Procedure: VIDEO BRONCHOSCOPY WITHOUT FLUORO;  Surgeon: Margaretha Seeds, MD;  Location: [place];  Service: Cardiopulmonary;  Laterality: Bilateral;  . VIDEO BRONCHOSCOPY WITH ENDOBRONCHIAL ULTRASOUND N/A 11/19/2018   Procedure: VIDEO BRONCHOSCOPY WITH ENDOBRONCHIAL ULTRASOUND;  Surgeon: Margaretha Seeds, MD;  Location: [facility] OR;  Service: Thoracic;  Laterality: N/A;    MEDICATIONS: No current  facility-administered medications for this encounter.    Marland Kitchen albuterol (VENTOLIN HFA) 108 (90 Base) MCG/ACT inhaler  . Alum & Mag Hydroxide-Simeth (GI COCKTAIL) SUSP suspension  . beclomethasone (QVAR) 80 MCG/ACT inhaler  . BREO ELLIPTA 200-25 MCG/INH AEPB  . clonazePAM (KLONOPIN) 1 MG tablet  . dexlansoprazole (DEXILANT) 60 MG capsule  . Diclofenac Potassium (CAMBIA) 50 MG PACK  . diphenhydrAMINE (BENADRYL) 50 MG/ML injection  . escitalopram (LEXAPRO) 20 MG tablet  . estradiol (ESTRACE) 2 MG tablet  . Ferrous Sulfate Dried (SLOW RELEASE IRON) 45 MG TBCR  . fluconazole (DIFLUCAN) 150 MG tablet  . furosemide (LASIX) 20 MG tablet  . hydrocortisone (CORTEF) 10 MG tablet  . levothyroxine (SYNTHROID, LEVOTHROID) 112 MCG tablet  . methocarbamol (ROBAXIN) 500 MG tablet  . metoprolol succinate (TOPROL-XL) 25 MG 24 hr tablet  . OVER THE COUNTER MEDICATION  . potassium chloride (K-DUR) 10 MEQ tablet  . rosuvastatin (CRESTOR) 10 MG tablet  . senna-docusate (SENOKOT-S) 8.6-50 MG tablet  . sucralfate (CARAFATE) 1 g tablet  . topiramate (TOPAMAX) 100 MG tablet  . traMADol (ULTRAM) 50 MG tablet  . zolpidem (AMBIEN CR) 12.5 MG CR tablet    Myra Gianotti, PA-C Surgical Short Stay/Anesthesiology [facility] Phone [phone] [facility] Phone [phone] 01/29/2019 5:12 PM

## 2019-01-29 NOTE — Progress Notes (Signed)
Pt denies any acute cardiopulmonary issues. Pt stated that she is under the care of Dr. Acie Fredrickson, Cardiology and Dr. Loanne Drilling, Pulmonology. Pt stated that she needs IV Cortef on DOS. Pt made aware to stop taking vitamins, fish oil, Turmeric Cucurminand herbal medications. Do not take any NSAIDs ie: Ibuprofen, Advil, Naproxen (Aleve), Motrin, Diclofenac ( [place]), BC and Goody Powder. Pt denies recent labs.   Pt denies that she and family members tested positive for COVID-19.  Coronavirus Screening  Pt denies that she and family members experienced the following symptoms:  Cough yes/no: No Fever (>100.73F)  yes/no: No Runny nose yes/no: No Sore throat yes/no: No Difficulty breathing/shortness of breath  yes/no: No Have you or a family member traveled in the last 14 days and where? yes/no: No Pt reminded that hospital visitation restrictions are in effect and the importance of the restrictions.  Pt verbalized understanding of all pre-op instructions. PA, Anesthesiology, asked to review pt history.

## 2019-01-29 NOTE — Anesthesia Preprocedure Evaluation (Addendum)
Anesthesia Evaluation  Patient identified by MRN, date of birth, ID band Patient awake    Reviewed: Allergy & Precautions, H&P , NPO status , Patient's Chart, lab work & pertinent test results, reviewed documented beta blocker date and time   History of Anesthesia Complications (+) PONV  Airway Mallampati: II  TM Distance: >3 FB Neck ROM: Full    Dental no notable dental hx. (+) Teeth Intact, Dental Advisory Given   Pulmonary neg pulmonary ROS,    Pulmonary exam normal breath sounds clear to auscultation       Cardiovascular hypertension, Pt. on medications and Pt. on home beta blockers  Rhythm:Regular Rate:Normal     Neuro/Psych  Headaches, Anxiety Depression    GI/Hepatic Neg liver ROS, GERD  Medicated and Controlled,  Endo/Other  negative endocrine ROS  Renal/GU negative Renal ROS  negative genitourinary   Musculoskeletal   Abdominal   Peds  Hematology  (+) Blood dyscrasia, anemia ,   Anesthesia Other Findings   Reproductive/Obstetrics negative OB ROS                           Anesthesia Physical Anesthesia Plan  ASA: III  Anesthesia Plan: General   Post-op Pain Management:    Induction: Intravenous  PONV Risk Score and Plan: 4 or greater and Propofol infusion, Diphenhydramine, Scopolamine patch - Pre-op, Dexamethasone and TIVA  Airway Management Planned: Oral ETT  Additional Equipment:   Intra-op Plan:   Post-operative Plan: Extubation in OR  Informed Consent: I have reviewed the patients History and Physical, chart, labs and discussed the procedure including the risks, benefits and alternatives for the proposed anesthesia with the patient or authorized representative who has indicated his/her understanding and acceptance.     Dental advisory given  Plan Discussed with: CRNA  Anesthesia Plan Comments: (See PAT note written 01/29/2019 by Myra Gianotti, PA-C. History  IV stress steroids with surgery due to adrenal insufficiency. )    Anesthesia Quick Evaluation

## 2019-01-30 ENCOUNTER — Other Ambulatory Visit: Payer: Self-pay

## 2019-01-30 ENCOUNTER — Encounter (HOSPITAL_COMMUNITY)
Admission: RE | Disposition: A | Discharge status: Home / Self Care | Payer: Self-pay | Source: Home / Self Care | Attending: Orthopedic Surgery

## 2019-01-30 ENCOUNTER — Ambulatory Visit (HOSPITAL_COMMUNITY): Payer: BLUE CROSS/BLUE SHIELD

## 2019-01-30 ENCOUNTER — Ambulatory Visit (HOSPITAL_COMMUNITY): Payer: BLUE CROSS/BLUE SHIELD | Admitting: Vascular Surgery

## 2019-01-30 ENCOUNTER — Ambulatory Visit (HOSPITAL_COMMUNITY)
Admission: RE | Admit: 2019-01-30 | Discharge: 2019-01-30 | Disposition: A | Discharge status: Home / Self Care | Payer: BLUE CROSS/BLUE SHIELD | Attending: Orthopedic Surgery | Admitting: Orthopedic Surgery

## 2019-01-30 ENCOUNTER — Encounter (HOSPITAL_COMMUNITY): Payer: Self-pay | Admitting: *Deleted

## 2019-01-30 DIAGNOSIS — E890 Postprocedural hypothyroidism: Secondary | ICD-10-CM | POA: Insufficient documentation

## 2019-01-30 DIAGNOSIS — Z85850 Personal history of malignant neoplasm of thyroid: Secondary | ICD-10-CM | POA: Diagnosis not present

## 2019-01-30 DIAGNOSIS — I350 Nonrheumatic aortic (valve) stenosis: Secondary | ICD-10-CM | POA: Diagnosis not present

## 2019-01-30 DIAGNOSIS — D649 Anemia, unspecified: Secondary | ICD-10-CM | POA: Insufficient documentation

## 2019-01-30 DIAGNOSIS — I7301 Raynaud's syndrome with gangrene: Secondary | ICD-10-CM | POA: Insufficient documentation

## 2019-01-30 DIAGNOSIS — E271 Primary adrenocortical insufficiency: Secondary | ICD-10-CM | POA: Insufficient documentation

## 2019-01-30 DIAGNOSIS — K219 Gastro-esophageal reflux disease without esophagitis: Secondary | ICD-10-CM | POA: Insufficient documentation

## 2019-01-30 DIAGNOSIS — Z853 Personal history of malignant neoplasm of breast: Secondary | ICD-10-CM | POA: Insufficient documentation

## 2019-01-30 DIAGNOSIS — I96 Gangrene, not elsewhere classified: Secondary | ICD-10-CM | POA: Diagnosis present

## 2019-01-30 DIAGNOSIS — I998 Other disorder of circulatory system: Secondary | ICD-10-CM | POA: Diagnosis not present

## 2019-01-30 DIAGNOSIS — Z90710 Acquired absence of both cervix and uterus: Secondary | ICD-10-CM | POA: Insufficient documentation

## 2019-01-30 DIAGNOSIS — Z7951 Long term (current) use of inhaled steroids: Secondary | ICD-10-CM | POA: Insufficient documentation

## 2019-01-30 DIAGNOSIS — Z9981 Dependence on supplemental oxygen: Secondary | ICD-10-CM | POA: Insufficient documentation

## 2019-01-30 DIAGNOSIS — J9611 Chronic respiratory failure with hypoxia: Secondary | ICD-10-CM | POA: Insufficient documentation

## 2019-01-30 DIAGNOSIS — Z87442 Personal history of urinary calculi: Secondary | ICD-10-CM | POA: Diagnosis not present

## 2019-01-30 DIAGNOSIS — I110 Hypertensive heart disease with heart failure: Secondary | ICD-10-CM | POA: Insufficient documentation

## 2019-01-30 DIAGNOSIS — Z885 Allergy status to narcotic agent status: Secondary | ICD-10-CM | POA: Diagnosis not present

## 2019-01-30 DIAGNOSIS — W230XXA Caught, crushed, jammed, or pinched between moving objects, initial encounter: Secondary | ICD-10-CM | POA: Diagnosis not present

## 2019-01-30 DIAGNOSIS — F329 Major depressive disorder, single episode, unspecified: Secondary | ICD-10-CM | POA: Diagnosis not present

## 2019-01-30 DIAGNOSIS — Z79899 Other long term (current) drug therapy: Secondary | ICD-10-CM | POA: Insufficient documentation

## 2019-01-30 DIAGNOSIS — I509 Heart failure, unspecified: Secondary | ICD-10-CM | POA: Insufficient documentation

## 2019-01-30 DIAGNOSIS — S67191A Crushing injury of left index finger, initial encounter: Secondary | ICD-10-CM | POA: Insufficient documentation

## 2019-01-30 DIAGNOSIS — Z888 Allergy status to other drugs, medicaments and biological substances status: Secondary | ICD-10-CM | POA: Diagnosis not present

## 2019-01-30 DIAGNOSIS — R7303 Prediabetes: Secondary | ICD-10-CM | POA: Insufficient documentation

## 2019-01-30 DIAGNOSIS — F419 Anxiety disorder, unspecified: Secondary | ICD-10-CM | POA: Diagnosis not present

## 2019-01-30 DIAGNOSIS — I2729 Other secondary pulmonary hypertension: Secondary | ICD-10-CM | POA: Diagnosis not present

## 2019-01-30 DIAGNOSIS — Z8541 Personal history of malignant neoplasm of cervix uteri: Secondary | ICD-10-CM | POA: Insufficient documentation

## 2019-01-30 DIAGNOSIS — I471 Supraventricular tachycardia: Secondary | ICD-10-CM | POA: Diagnosis not present

## 2019-01-30 DIAGNOSIS — Z452 Encounter for adjustment and management of vascular access device: Secondary | ICD-10-CM

## 2019-01-30 HISTORY — DX: Pneumonia, unspecified organism: J18.9

## 2019-01-30 HISTORY — DX: Dependence on supplemental oxygen: Z99.81

## 2019-01-30 HISTORY — DX: Depression, unspecified: F32.A

## 2019-01-30 HISTORY — DX: Hodgkin lymphoma, unspecified, unspecified site: C81.90

## 2019-01-30 HISTORY — PX: AMPUTATION: SHX166

## 2019-01-30 HISTORY — DX: Personal history of urinary calculi: Z87.442

## 2019-01-30 HISTORY — DX: Headache, unspecified: R51.9

## 2019-01-30 HISTORY — DX: Anxiety disorder, unspecified: F41.9

## 2019-01-30 HISTORY — DX: Headache: R51

## 2019-01-30 HISTORY — DX: Other specified postprocedural states: Z98.890

## 2019-01-30 HISTORY — DX: Other specified postprocedural states: R11.2

## 2019-01-30 HISTORY — DX: Prediabetes: R73.03

## 2019-01-30 HISTORY — DX: Anemia, unspecified: D64.9

## 2019-01-30 HISTORY — DX: Cardiac murmur, unspecified: R01.1

## 2019-01-30 HISTORY — DX: Major depressive disorder, single episode, unspecified: F32.9

## 2019-01-30 LAB — POCT I-STAT 4, (NA,K, GLUC, HGB,HCT)
Glucose, Bld: 108 mg/dL — ABNORMAL HIGH (ref 70–99)
HCT: 33 % — ABNORMAL LOW (ref 36.0–46.0)
Hemoglobin: 11.2 g/dL — ABNORMAL LOW (ref 12.0–15.0)
Potassium: 3.3 mmol/L — ABNORMAL LOW (ref 3.5–5.1)
Sodium: 139 mmol/L (ref 135–145)

## 2019-01-30 LAB — GLUCOSE, CAPILLARY: Glucose-Capillary: 120 mg/dL — ABNORMAL HIGH (ref 70–99)

## 2019-01-30 SURGERY — AMPUTATION DIGIT
Anesthesia: General | Site: Hand | Laterality: Left

## 2019-01-30 MED ORDER — ROCURONIUM BROMIDE 10 MG/ML (PF) SYRINGE
PREFILLED_SYRINGE | INTRAVENOUS | Status: DC | PRN
  Administered 2019-01-30: 40 mg via INTRAVENOUS

## 2019-01-30 MED ORDER — SCOPOLAMINE 1 MG/3DAYS TD PT72: 1.0000 | MEDICATED_PATCH | TRANSDERMAL | Status: DC

## 2019-01-30 MED ORDER — ACETAMINOPHEN 10 MG/ML IV SOLN
1000.0000 mg | Freq: Four times a day (QID) | INTRAVENOUS | Status: DC
  Administered 2019-01-30: 1000 mg via INTRAVENOUS

## 2019-01-30 MED ORDER — HYDROMORPHONE HCL 2 MG PO TABS
2.0000 mg | ORAL_TABLET | Freq: Once | ORAL | Status: AC
  Administered 2019-01-30: 2 mg via ORAL

## 2019-01-30 MED ORDER — HYDROMORPHONE HCL 2 MG PO TABS
ORAL_TABLET | ORAL | Status: AC
  Administered 2019-01-30: 2 mg via ORAL
  Filled 2019-01-30: qty 1

## 2019-01-30 MED ORDER — KETOROLAC TROMETHAMINE 30 MG/ML IJ SOLN
INTRAMUSCULAR | Status: AC
  Administered 2019-01-30: 30 mg via INTRAVENOUS
  Filled 2019-01-30: qty 1

## 2019-01-30 MED ORDER — CHLORHEXIDINE GLUCONATE 4 % EX LIQD: 60.0000 mL | Freq: Once | CUTANEOUS | Status: DC

## 2019-01-30 MED ORDER — MIDAZOLAM HCL 2 MG/2ML IJ SOLN
INTRAMUSCULAR | Status: AC
  Filled 2019-01-30: qty 2

## 2019-01-30 MED ORDER — KETOROLAC TROMETHAMINE 30 MG/ML IJ SOLN
30.0000 mg | Freq: Once | INTRAMUSCULAR | Status: AC
  Administered 2019-01-30: 30 mg via INTRAVENOUS

## 2019-01-30 MED ORDER — DIPHENHYDRAMINE HCL 50 MG/ML IJ SOLN
25.0000 mg | Freq: Once | INTRAMUSCULAR | Status: AC
  Administered 2019-01-30: 12:00:00 25 mg via INTRAVENOUS

## 2019-01-30 MED ORDER — SCOPOLAMINE 1 MG/3DAYS TD PT72
MEDICATED_PATCH | TRANSDERMAL | Status: AC
  Administered 2019-01-30: 10:00:00 1.5 mg
  Filled 2019-01-30: qty 1

## 2019-01-30 MED ORDER — SODIUM CHLORIDE 0.9 % IV SOLN
INTRAVENOUS | Status: DC | PRN
  Administered 2019-01-30: 25 ug/min via INTRAVENOUS

## 2019-01-30 MED ORDER — DIPHENHYDRAMINE HCL 50 MG/ML IJ SOLN
INTRAMUSCULAR | Status: AC
  Administered 2019-01-30: 25 mg
  Filled 2019-01-30: qty 1

## 2019-01-30 MED ORDER — FENTANYL CITRATE (PF) 250 MCG/5ML IJ SOLN
INTRAMUSCULAR | Status: DC | PRN
  Administered 2019-01-30 (×2): 50 ug via INTRAVENOUS
  Administered 2019-01-30 (×2): 25 ug via INTRAVENOUS

## 2019-01-30 MED ORDER — DEXAMETHASONE SODIUM PHOSPHATE 10 MG/ML IJ SOLN
INTRAMUSCULAR | Status: DC | PRN
  Administered 2019-01-30: 5 mg via INTRAVENOUS

## 2019-01-30 MED ORDER — FENTANYL CITRATE (PF) 250 MCG/5ML IJ SOLN
INTRAMUSCULAR | Status: AC
  Filled 2019-01-30: qty 5

## 2019-01-30 MED ORDER — PROPOFOL 10 MG/ML IV BOLUS
INTRAVENOUS | Status: DC | PRN
  Administered 2019-01-30: 150 mg via INTRAVENOUS

## 2019-01-30 MED ORDER — HYDROMORPHONE HCL 1 MG/ML IJ SOLN
0.2500 mg | INTRAMUSCULAR | Status: DC | PRN
  Administered 2019-01-30 (×2): 0.5 mg via INTRAVENOUS

## 2019-01-30 MED ORDER — HYDROCORTISONE NA SUCCINATE PF 100 MG IJ SOLR
INTRAMUSCULAR | Status: DC | PRN
  Administered 2019-01-30: 100 mg via INTRAVENOUS

## 2019-01-30 MED ORDER — ROCURONIUM BROMIDE 10 MG/ML (PF) SYRINGE: PREFILLED_SYRINGE | INTRAVENOUS | Status: DC | PRN

## 2019-01-30 MED ORDER — LACTATED RINGERS IV SOLN
INTRAVENOUS | Status: DC
  Administered 2019-01-30: 11:00:00 via INTRAVENOUS

## 2019-01-30 MED ORDER — PROPOFOL 500 MG/50ML IV EMUL
INTRAVENOUS | Status: DC | PRN
  Administered 2019-01-30: 150 ug/kg/min via INTRAVENOUS

## 2019-01-30 MED ORDER — CEFAZOLIN SODIUM-DEXTROSE 2-4 GM/100ML-% IV SOLN
2.0000 g | INTRAVENOUS | Status: AC
  Administered 2019-01-30: 2 g via INTRAVENOUS
  Filled 2019-01-30: qty 100

## 2019-01-30 MED ORDER — 0.9 % SODIUM CHLORIDE (POUR BTL) OPTIME
TOPICAL | Status: DC | PRN
  Administered 2019-01-30: 1000 mL

## 2019-01-30 MED ORDER — LIDOCAINE 2% (20 MG/ML) 5 ML SYRINGE
INTRAMUSCULAR | Status: DC | PRN
  Administered 2019-01-30: 6 mg via INTRAVENOUS

## 2019-01-30 MED ORDER — HYDROMORPHONE HCL 2 MG PO TABS: 2.0000 mg | ORAL_TABLET | ORAL | 0 refills | Status: DC | PRN

## 2019-01-30 MED ORDER — CEFAZOLIN SODIUM 1 G IJ SOLR
INTRAMUSCULAR | Status: AC
  Filled 2019-01-30: qty 20

## 2019-01-30 MED ORDER — BUPIVACAINE HCL (PF) 0.25 % IJ SOLN
INTRAMUSCULAR | Status: DC | PRN
  Administered 2019-01-30: 10 mL

## 2019-01-30 MED ORDER — ROCURONIUM BROMIDE 10 MG/ML (PF) SYRINGE
PREFILLED_SYRINGE | INTRAVENOUS | Status: AC
  Filled 2019-01-30: qty 10

## 2019-01-30 MED ORDER — LIDOCAINE 2% (20 MG/ML) 5 ML SYRINGE
INTRAMUSCULAR | Status: AC
  Filled 2019-01-30: qty 5

## 2019-01-30 MED ORDER — PROPOFOL 10 MG/ML IV BOLUS
INTRAVENOUS | Status: AC
  Filled 2019-01-30: qty 20

## 2019-01-30 MED ORDER — ACETAMINOPHEN 10 MG/ML IV SOLN
INTRAVENOUS | Status: AC
  Filled 2019-01-30: qty 100

## 2019-01-30 MED ORDER — BUPIVACAINE HCL (PF) 0.25 % IJ SOLN
INTRAMUSCULAR | Status: AC
  Filled 2019-01-30: qty 30

## 2019-01-30 MED ORDER — HYDROMORPHONE HCL 1 MG/ML IJ SOLN
INTRAMUSCULAR | Status: AC
  Administered 2019-01-30: 0.5 mg via INTRAVENOUS
  Filled 2019-01-30: qty 1

## 2019-01-30 MED ORDER — PROPOFOL 500 MG/50ML IV EMUL
INTRAVENOUS | Status: AC
  Filled 2019-01-30: qty 50

## 2019-01-30 SURGICAL SUPPLY — 52 items
BANDAGE ELASTIC 3 VELCRO ST LF (GAUZE/BANDAGES/DRESSINGS) ×4 IMPLANT
BNDG COHESIVE 1X5 TAN STRL LF (GAUZE/BANDAGES/DRESSINGS) IMPLANT
BNDG CONFORM 2 STRL LF (GAUZE/BANDAGES/DRESSINGS) IMPLANT
BNDG GAUZE ELAST 4 BULKY (GAUZE/BANDAGES/DRESSINGS) ×6 IMPLANT
CORDS BIPOLAR (ELECTRODE) ×3 IMPLANT
COVER SURGICAL LIGHT HANDLE (MISCELLANEOUS) ×1 IMPLANT
COVER WAND RF STERILE (DRAPES) ×3 IMPLANT
CUFF TOURNIQUET SINGLE 18IN (TOURNIQUET CUFF) ×3 IMPLANT
CUFF TOURNIQUET SINGLE 24IN (TOURNIQUET CUFF) IMPLANT
DRAPE SURG 17X23 STRL (DRAPES) ×3 IMPLANT
DRSG ADAPTIC 3X8 NADH LF (GAUZE/BANDAGES/DRESSINGS) ×2 IMPLANT
DRSG MEPITEL 4X7.2 (GAUZE/BANDAGES/DRESSINGS) ×1 IMPLANT
GAUZE SPONGE 2X2 8PLY STRL LF (GAUZE/BANDAGES/DRESSINGS) IMPLANT
GAUZE SPONGE 4X4 12PLY STRL (GAUZE/BANDAGES/DRESSINGS) IMPLANT
GAUZE SPONGE 4X4 12PLY STRL LF (GAUZE/BANDAGES/DRESSINGS) ×2 IMPLANT
GAUZE XEROFORM 1X8 LF (GAUZE/BANDAGES/DRESSINGS) IMPLANT
GAUZE XEROFORM 5X9 LF (GAUZE/BANDAGES/DRESSINGS) ×3 IMPLANT
GLOVE BIOGEL M 8.0 STRL (GLOVE) ×1 IMPLANT
GLOVE BIOGEL PI IND STRL 7.0 (GLOVE) IMPLANT
GLOVE BIOGEL PI IND STRL 7.5 (GLOVE) ×2 IMPLANT
GLOVE BIOGEL PI INDICATOR 7.0 (GLOVE) ×2
GLOVE BIOGEL PI INDICATOR 7.5 (GLOVE) ×4
GLOVE SS BIOGEL STRL SZ 8 (GLOVE) ×1 IMPLANT
GLOVE SUPERSENSE BIOGEL SZ 8 (GLOVE) ×2
GLOVE SURG SS PI 7.0 STRL IVOR (GLOVE) ×6 IMPLANT
GLOVE SURG SS PI 7.5 STRL IVOR (GLOVE) ×4 IMPLANT
GOWN STRL REUS W/ TWL LRG LVL3 (GOWN DISPOSABLE) ×2 IMPLANT
GOWN STRL REUS W/ TWL XL LVL3 (GOWN DISPOSABLE) ×1 IMPLANT
GOWN STRL REUS W/TWL LRG LVL3 (GOWN DISPOSABLE) ×6
GOWN STRL REUS W/TWL XL LVL3 (GOWN DISPOSABLE) ×3
KIT BASIN OR (CUSTOM PROCEDURE TRAY) ×3 IMPLANT
KIT TURNOVER KIT B (KITS) ×3 IMPLANT
MANIFOLD NEPTUNE II (INSTRUMENTS) ×1 IMPLANT
NDL HYPO 25GX1X1/2 BEV (NEEDLE) IMPLANT
NEEDLE HYPO 25GX1X1/2 BEV (NEEDLE) ×3 IMPLANT
NS IRRIG 1000ML POUR BTL (IV SOLUTION) ×3 IMPLANT
PACK ORTHO EXTREMITY (CUSTOM PROCEDURE TRAY) ×3 IMPLANT
PAD ARMBOARD 7.5X6 YLW CONV (MISCELLANEOUS) ×9 IMPLANT
SCRUB BETADINE 4OZ XXX (MISCELLANEOUS) ×3 IMPLANT
SOL PREP POV-IOD 4OZ 10% (MISCELLANEOUS) ×4 IMPLANT
SPECIMEN JAR SMALL (MISCELLANEOUS) ×3 IMPLANT
SPONGE GAUZE 2X2 STER 10/PKG (GAUZE/BANDAGES/DRESSINGS)
SUT MERSILENE 4 0 P 3 (SUTURE) IMPLANT
SUT PROLENE 4 0 PS 2 18 (SUTURE) ×2 IMPLANT
SUT PROLENE 5 0 P 3 (SUTURE) ×2 IMPLANT
SYR CONTROL 10ML LL (SYRINGE) ×2 IMPLANT
TOWEL OR 17X24 6PK STRL BLUE (TOWEL DISPOSABLE) ×3 IMPLANT
TOWEL OR 17X26 10 PK STRL BLUE (TOWEL DISPOSABLE) ×3 IMPLANT
TUBE CONNECTING 12'X1/4 (SUCTIONS) ×1
TUBE CONNECTING 12X1/4 (SUCTIONS) ×1 IMPLANT
UNDERPAD 30X30 (UNDERPADS AND DIAPERS) ×3 IMPLANT
WATER STERILE IRR 1000ML POUR (IV SOLUTION) ×3 IMPLANT

## 2019-01-30 NOTE — Anesthesia Procedure Notes (Signed)
Procedure Name: Intubation Date/Time: 01/30/2019 11:20 AM Performed by: Bryson Corona, CRNA Pre-anesthesia Checklist: Patient identified, Emergency Drugs available, Suction available and Patient being monitored Patient Re-evaluated:Patient Re-evaluated prior to induction Oxygen Delivery Method: Circle System Utilized Preoxygenation: Pre-oxygenation with 100% oxygen Induction Type: IV induction Ventilation: Mask ventilation without difficulty Laryngoscope Size: Mac and 3 Grade View: Grade I Tube type: Oral Tube size: 7.0 mm Number of attempts: 1 Airway Equipment and Method: Stylet and Oral airway Placement Confirmation: ETT inserted through vocal cords under direct vision,  positive ETCO2 and breath sounds checked- equal and bilateral Secured at: 21 cm Tube secured with: Tape Dental Injury: Teeth and Oropharynx as per pre-operative assessment

## 2019-01-30 NOTE — Progress Notes (Signed)
Central line d/c'd per hospital policy. Occlusive dressing with Vaseline guaze in place. Patient instructed to keep bandage on for 24 hrs.

## 2019-01-30 NOTE — Op Note (Signed)
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  Operative indications.  This patient is a pleasant 48 year old female with multiple medical problems as the chart no takes.  The patient and I discussed relevant issues concerns and issues involved in her care plan to include risk and benefit profiles of surgical and nonsurgical algorithm of care.  She had a crushing injury to her finger which developed infectious sequelae.  The infection has resolved but in the wake of the patient's poor healing capacity she has dry gangrene and we have discussed options of serial debridements versus vacuum-assisted closure device versus amputation.  She would like to proceed with amputation to try and move her life forward and not have a prolonged recovery.  Given the multiple medical issues and comorbidities.  Operative procedure patient was seen by myself and anesthesia taken to the operative theater and underwent smooth induction of general anesthesia she was prepped and draped in usual sterile fashion with Betadine scrub and paint outline marks were made visually tourniquet insufflated and a fishmouth incision was made with the volar flap limb spared.  I then dissected down and remove the eschar and devitalized skin tissue I left a nice cuff of tissue so that I was much outside the margin of the dry gangrene.  I then dissected down disarticulated the PIP joint dorsally and then flipped the finger and identified the volar flap.  At this time I performed bilateral neurectomies of the radial and ulnar digital nerves with crushing and cauterization technique and then secured hemostasis about the arterial branches.  The patient  tolerated this well there were no complicating features.  We then performed flexor tenotomy and following this irrigated with 1 to 2 L of saline.  Once this was complete the patient then underwent closure of the wound after hemostasis was secured.  The volar flap had excellent coverage there is no tightness the soft tissue constraints looked excellent.  Thus this was a PIP level amputation the patient tolerated this well and there were no issues.  Moving forward I will see her back in the office in 14 days asked her to notify me if any problems occur and move forward with therapy at 3 weeks postop there is notes been discussed and all questions encouraged and answered.  She will be discharged home on Dilaudid p.o. and continue her doxycycline.  10 cc of Sensorcaine with epinephrine were placed for postop analgesia.  Dairon Procter MD

## 2019-01-30 NOTE — Transfer of Care (Signed)
Immediate Anesthesia Transfer of Care Note  Patient: [name]  Procedure(s) Performed: Left Index finger amputation with flap reconstruction and repair reconstruction (Left Hand)  Patient Location: PACU  Anesthesia Type:General  Level of Consciousness: drowsy  Airway & Oxygen Therapy: Patient Spontanous Breathing and Patient connected to face mask oxygen  Post-op Assessment: Report given to RN and Post -op Vital signs reviewed and stable  Post vital signs: Reviewed and stable  Last Vitals:  Vitals Value Taken Time  BP 101/75 01/30/2019 12:26 PM  Temp    Pulse 96 01/30/2019 12:30 PM  Resp 16 01/30/2019 12:30 PM  SpO2 97 % 01/30/2019 12:30 PM  Vitals shown include unvalidated device data.  Last Pain:  Vitals:   01/30/19 0836  TempSrc:   PainSc: 7       Patients Stated Pain Goal: 8 (35/33/17 4099)  Complications: No apparent anesthesia complications

## 2019-01-30 NOTE — Anesthesia Postprocedure Evaluation (Signed)
Anesthesia Post Note  Patient: [name]  Procedure(s) Performed: Left Index finger amputation with flap reconstruction and repair reconstruction (Left Hand)     Patient location during evaluation: PACU Anesthesia Type: General Level of consciousness: awake and alert Pain management: pain level controlled Vital Signs Assessment: post-procedure vital signs reviewed and stable Respiratory status: spontaneous breathing, nonlabored ventilation, respiratory function stable and patient connected to nasal cannula oxygen Cardiovascular status: blood pressure returned to baseline and stable Postop Assessment: no apparent nausea or vomiting Anesthetic complications: no    Last Vitals:  Vitals:   01/30/19 1405 01/30/19 1450  BP: 90/60   Pulse: 87   Resp: 10   Temp:  (!) 36.3 C  SpO2: 98%     Last Pain:  Vitals:   01/30/19 1420  TempSrc:   PainSc: Asleep                 Jayvin Hurrell,W. EDMOND

## 2019-01-30 NOTE — Anesthesia Procedure Notes (Signed)
Central Venous Catheter Insertion Performed by: Roderic Palau, MD, anesthesiologist Start/End5/10/2018 10:23 AM, 01/30/2019 10:33 AM Patient location: Pre-op. Preanesthetic checklist: patient identified, IV checked, site marked, risks and benefits discussed, surgical consent, monitors and equipment checked, pre-op evaluation, timeout performed and anesthesia consent Position: Trendelenburg Lidocaine 1% used for infiltration and patient sedated Hand hygiene performed , maximum sterile barriers used  and Seldinger technique used Catheter size: 8 Fr Total catheter length 16. Central line was placed.Double lumen Procedure performed using ultrasound guided technique. Ultrasound Notes:anatomy identified, needle tip was noted to be adjacent to the nerve/plexus identified, no ultrasound evidence of intravascular and/or intraneural injection and image(s) printed for medical record Attempts: 1 Following insertion, dressing applied, line sutured and Biopatch. Post procedure assessment: blood return through all ports  Patient tolerated the procedure well with no immediate complications.

## 2019-01-30 NOTE — H&P (Signed)
[name] is an 48 y.o. female.   Chief Complaint: plan left index finger amputation HPI: Patient presents for evaluation and treatment of the of their upper extremity predicament. The patient denies neck, back, chest or  abdominal pain. The patient notes that they have no lower extremity problems. The patients primary complaint is noted. We are planning surgical care pathway for the upper extremity.  Past Medical History:  Diagnosis Date  . Addison's disease ([place])   . Adrenal insufficiency ([place])   . Anemia   . Anxiety   . Aortic stenosis   . Aortic stenosis   . Appendicitis 12/19/09  . Breast cancer ([place])    STATUS POST BILATERAL MASTECTOMY. STATUS POST RECONSTRUCTION. SHE HAD SILICONE BREAST IMPLANTS AND THE LEFT IMPLANT IS LEAKING SLIGHTLY  . Cervical cancer ([place]) 12/23/2018  . Chest pain   . CHF with right heart failure ([place]) 04/17/2017  . Chronic respiratory failure with hypoxia ([place]) 12/23/2018  . Depression   . GERD (gastroesophageal reflux disease)   . Headache    migraines  . Heart murmur   . History of kidney stones   . Hodgkin lymphoma ([facility])    STATUS POST MANTLE RADIATION  . Hodgkin's lymphoma ([place])    1987  . Hypertension   . Hypoxia   . Osteoporosis   . Palpitations   . Pituitary adenoma ([place]) 12/23/2018  . Pneumonia   . PONV (postoperative nausea and vomiting)   . Pre-diabetes    per pt; no meds  . Pulmonary hypertension ([place]) 12/23/2018  . Raynaud phenomenon   . Supplemental oxygen dependent    3 liters  . SVT (supraventricular tachycardia) ([place])   . Tachycardia   . Thyroid cancer ([place])    STATUS POST SURGICAL REMOVAL-CURRENT ON THYROID REPLACEMENT    Past Surgical History:  Procedure Laterality Date  . ABDOMINAL HYSTERECTOMY    . APPENDECTOMY    . CARDIAC CATHETERIZATION  05/18/09   NORMAL CATH  . I&D EXTREMITY Left 12/23/2018   Procedure: IRRIGATION AND DEBRIDEMENT HAND / INDEX FINGER;  Surgeon: Roseanne Kaufman, MD;  Location: [facility];  Service:  Orthopedics;  Laterality: Left;  Marland Kitchen MASTECTOMY    . PITUITARY SURGERY    . RIGHT/LEFT HEART CATH AND CORONARY ANGIOGRAPHY N/A 04/02/2018   Procedure: RIGHT/LEFT HEART CATH AND CORONARY ANGIOGRAPHY;  Surgeon: Burnell Blanks, MD;  Location: [facility];  Service: Cardiovascular;  Laterality: N/A;  . TOTAL THYROIDECTOMY    . VIDEO BRONCHOSCOPY Bilateral 11/14/2018   Procedure: VIDEO BRONCHOSCOPY WITHOUT FLUORO;  Surgeon: Margaretha Seeds, MD;  Location: [facility];  Service: Cardiopulmonary;  Laterality: Bilateral;  . VIDEO BRONCHOSCOPY WITH ENDOBRONCHIAL ULTRASOUND N/A 11/19/2018   Procedure: VIDEO BRONCHOSCOPY WITH ENDOBRONCHIAL ULTRASOUND;  Surgeon: Margaretha Seeds, MD;  Location: [facility];  Service: Thoracic;  Laterality: N/A;    Family History  Family history unknown: Yes   Social History:  reports that she has never smoked. She has never used smokeless tobacco. She reports current alcohol use. She reports that she does not use drugs.  Allergies:  Allergies  Allergen Reactions  . Na Ferric Gluc Cplx In Sucrose Anaphylaxis  . Cymbalta [Duloxetine Hcl] Swelling and Anxiety  . Hydromorphone Other (See Comments)    BP drop and heart rate drops 5.1.20 PT REPORTS THAT SHE TAKES DILAUDID AT HOME  . Succinylcholine Other (See Comments)    Lock Jaw  . Ativan [Lorazepam] Rash and Other (See Comments)    Dystonia  . Buprenorphine  Hcl Hives  . Compazine Other (See Comments)    Altered mental status Aggression  . Metoclopramide Other (See Comments)    Dystonia  . Morphine And Related Hives  . Ondansetron Hives and Rash  . Promethazine Hcl Hives  . Tegaderm Ag Mesh [Silver] Rash    Old formulation only, is able to tolerate new formulation    Medications Prior to Admission  Medication Sig Dispense Refill  . albuterol (VENTOLIN HFA) 108 (90 Base) MCG/ACT inhaler Inhale 2 puffs into the lungs every 6 (six) hours as needed for wheezing or shortness of breath.    . Alum & Mag  Hydroxide-Simeth (GI COCKTAIL) SUSP suspension Take 30 mLs daily as needed by mouth for indigestion (BLEEDING ULCERS). Shake well.    . beclomethasone (QVAR) 80 MCG/ACT inhaler Inhale 2 puffs into the lungs 2 (two) times daily.    Marland Kitchen BREO ELLIPTA 200-25 MCG/INH AEPB Inhale 1 puff into the lungs daily.   11  . clonazePAM (KLONOPIN) 1 MG tablet Take 1 mg by mouth 3 (three) times daily.    Marland Kitchen dexlansoprazole (DEXILANT) 60 MG capsule Take 60 mg by mouth daily.      . Diclofenac Potassium (CAMBIA) 50 MG PACK Take 1 packet by mouth 3 (three) times daily as needed (migraines).     . diphenhydrAMINE (BENADRYL) 50 MG/ML injection Inject 1 mL (50 mg total) into the vein every 6 (six) hours as needed (nausea). 25 mL 0  . escitalopram (LEXAPRO) 20 MG tablet Take 20 mg by mouth daily.   1  . estradiol (ESTRACE) 2 MG tablet Take 2 mg by mouth daily.  11  . Ferrous Sulfate Dried (SLOW RELEASE IRON) 45 MG TBCR Take 1 tablet at bedtime by mouth.    . furosemide (LASIX) 20 MG tablet Take 20 mg by mouth 2 (two) times daily.    . hydrocortisone (CORTEF) 10 MG tablet Take 5-10 mg by mouth See admin instructions. Take 10 mg in the am and 5 mg in the pm    . levothyroxine (SYNTHROID, LEVOTHROID) 112 MCG tablet Take 112 mcg by mouth daily before breakfast.    . methocarbamol (ROBAXIN) 500 MG tablet Take 1 tablet (500 mg total) by mouth every 6 (six) hours as needed for muscle spasms. 60 tablet 0  . metoprolol succinate (TOPROL-XL) 25 MG 24 hr tablet Take 37.5 mg by mouth daily.    Marland Kitchen OVER THE COUNTER MEDICATION Take 500 mg by mouth daily. Tumeric cucurmin    . rosuvastatin (CRESTOR) 10 MG tablet Take 10 mg by mouth daily.     Marland Kitchen senna-docusate (SENOKOT-S) 8.6-50 MG tablet Take 1 tablet by mouth 2 (two) times daily. For constipation 60 tablet 0  . sucralfate (CARAFATE) 1 g tablet Take 1 g as needed by mouth (FOR ULCERS).    Marland Kitchen topiramate (TOPAMAX) 100 MG tablet Take 150 mg by mouth at bedtime.     . traMADol (ULTRAM) 50 MG  tablet Take 50 mg by mouth daily as needed (migraine).     . zolpidem (AMBIEN CR) 12.5 MG CR tablet Take 12.5 mg by mouth at bedtime.      . potassium chloride (K-DUR) 10 MEQ tablet Take 1 tablet (10 mEq total) by mouth daily. (Patient not taking: Reported on 01/30/2019) 90 tablet 3    Results for orders placed or performed during the hospital encounter of 01/30/19 (from the past 48 hour(s))  Glucose, capillary     Status: Abnormal   Collection Time: 01/30/19  8:10 AM  Result Value Ref Range   Glucose-Capillary 120 (H) 70 - 99 mg/dL  I-STAT 4, (NA,K, GLUC, HGB,HCT)     Status: Abnormal   Collection Time: 01/30/19 10:31 AM  Result Value Ref Range   Sodium 139 135 - 145 mmol/L   Potassium 3.3 (L) 3.5 - 5.1 mmol/L   Glucose, Bld 108 (H) 70 - 99 mg/dL   HCT 33.0 (L) 36.0 - 46.0 %   Hemoglobin 11.2 (L) 12.0 - 15.0 g/dL   No results found.  Review of Systems  Cardiovascular: Negative.   Gastrointestinal: Negative.   Genitourinary: Negative.     Blood pressure 114/67, pulse (!) 112, temperature (!) 100.5 F (38.1 C), temperature source Oral, resp. rate 19, height _0  (1.651 m), weight 81.2 kg, SpO2 96 %. Physical Exam ischemic left index finger The patient is alert and oriented in no acute distress. The patient complains of pain in the affected upper extremity.  The patient is noted to have a normal HEENT exam. Lung fields show equal chest expansion and no shortness of breath. Abdomen exam is nontender without distention. Lower extremity examination does not show any fracture dislocation or blood clot symptoms. Pelvis is stable and the neck and back are stable and nontender. Assessment/Plan We are planning surgery for your upper extremity. The risk and benefits of surgery to include risk of bleeding, infection, anesthesia,  damage to normal structures and failure of the surgery to accomplish its intended goals of relieving symptoms and restoring function have been discussed in detail.  With this in mind we plan to proceed. I have specifically discussed with the patient the pre-and postoperative regime and the dos and don'ts and risk and benefits in great detail. Risk and benefits of surgery also include risk of dystrophy(CRPS), chronic nerve pain, failure of the healing process to go onto completion and other inherent risks of surgery The relavent the pathophysiology of the disease/injury process, as well as the alternatives for treatment and postoperative course of action has been discussed in great detail with the patient who desires to proceed.  We will do everything in our power to help you (the patient) restore function to the upper extremity. It is a pleasure to see this patient today.   Left index finger amputation with bilateral neurectomies and repair as necessary  Willa Frater III, MD 01/30/2019, 11:09 AM

## 2019-01-30 NOTE — Progress Notes (Signed)
Unable to access port due to positioning, RN Tye [place] states that a central line will be placed for the patient at this time

## 2019-01-30 NOTE — Discharge Instructions (Addendum)
Please make sure to elevate and keep the area clean and dry.  Use the doxycycline until it is finished for a 10-day course minimum.  Call for any problems.  We will call you for your follow-up in 14 days.  Please maximally elevate your hand today.  Please keep your dressing clean and dry

## 2019-01-30 NOTE — Progress Notes (Addendum)
Pt. Arrived to Short Stay temp was 100.5, rechecked 100.3. Pt. Reports the left finger is infected and the fever is coming from that.   Pt. C/o of nausea states it coming from her headache. Notified Dr. Therisa Doyne, new orders received.

## 2019-01-31 ENCOUNTER — Encounter (HOSPITAL_COMMUNITY): Payer: Self-pay | Admitting: Orthopedic Surgery

## 2019-03-24 ENCOUNTER — Telehealth: Payer: Self-pay | Admitting: Pulmonary Disease

## 2019-03-30 NOTE — Telephone Encounter (Signed)
Pt has been scheduled for PFT 7/7. Nothing further needed.

## 2019-04-01 ENCOUNTER — Other Ambulatory Visit: Payer: Self-pay | Admitting: Pulmonary Disease

## 2019-04-03 ENCOUNTER — Other Ambulatory Visit (HOSPITAL_COMMUNITY)
Admission: RE | Admit: 2019-04-03 | Discharge: 2019-04-03 | Disposition: A | Discharge status: Home / Self Care | Payer: BC Managed Care – PPO | Source: Ambulatory Visit | Attending: Pulmonary Disease | Admitting: Pulmonary Disease

## 2019-04-03 DIAGNOSIS — Z1159 Encounter for screening for other viral diseases: Secondary | ICD-10-CM | POA: Insufficient documentation

## 2019-04-03 DIAGNOSIS — Z01812 Encounter for preprocedural laboratory examination: Secondary | ICD-10-CM | POA: Diagnosis not present

## 2019-04-04 LAB — SARS CORONAVIRUS 2 (TAT 6-24 HRS)

## 2019-04-07 ENCOUNTER — Other Ambulatory Visit: Payer: Self-pay | Admitting: Pulmonary Disease

## 2019-04-13 ENCOUNTER — Ambulatory Visit (INDEPENDENT_AMBULATORY_CARE_PROVIDER_SITE_OTHER): Payer: BC Managed Care – PPO | Admitting: Pulmonary Disease

## 2019-04-13 ENCOUNTER — Other Ambulatory Visit: Payer: Self-pay

## 2019-04-13 ENCOUNTER — Ambulatory Visit (HOSPITAL_COMMUNITY)
Admission: RE | Admit: 2019-04-13 | Discharge: 2019-04-13 | Disposition: A | Discharge status: Home / Self Care | Payer: BC Managed Care – PPO | Source: Ambulatory Visit | Attending: Pulmonary Disease | Admitting: Pulmonary Disease

## 2019-04-13 ENCOUNTER — Encounter: Payer: Self-pay | Admitting: Pulmonary Disease

## 2019-04-13 VITALS — BP 100/62 | HR 103 | Temp 97.6°F | Ht 65.0 in | Wt 181.0 lb

## 2019-04-13 DIAGNOSIS — R042 Hemoptysis: Secondary | ICD-10-CM

## 2019-04-13 DIAGNOSIS — R0989 Other specified symptoms and signs involving the circulatory and respiratory systems: Secondary | ICD-10-CM

## 2019-04-13 DIAGNOSIS — J9611 Chronic respiratory failure with hypoxia: Secondary | ICD-10-CM

## 2019-04-13 DIAGNOSIS — J45991 Cough variant asthma: Secondary | ICD-10-CM

## 2019-04-13 DIAGNOSIS — R0602 Shortness of breath: Secondary | ICD-10-CM | POA: Diagnosis not present

## 2019-04-13 DIAGNOSIS — Z86000 Personal history of in-situ neoplasm of breast: Secondary | ICD-10-CM

## 2019-04-13 HISTORY — DX: Cough variant asthma: J45.991

## 2019-04-13 MED ORDER — IOHEXOL 350 MG/ML SOLN
75.0000 mL | Freq: Once | INTRAVENOUS | Status: AC | PRN
  Administered 2019-04-13: 75 mL via INTRAVENOUS

## 2019-04-13 NOTE — Progress Notes (Signed)
Subjective:   PATIENT ID: [name] GENDER: female DOB: 02-Sep-1971, MRN: [id number]   HPI  Chief Complaint  Patient presents with  . Hospitalization Follow-up    Reason for Visit: Follow-up  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 She is seen at [facility] by oncology and pulmonary.  She presents as hospital follow-up.  She was seen as a pulmonary consult in February 2020 for hemoptysis.  She had initially presented with bilateral upper extremity edema, pneumonia and small volume hemoptysis.  There was a concern for autoimmune process for which she was being worked up for.  She underwent bronchoscopy on 11/19/2018 under anesthesia after failure to tolerate flexible bronchoscopy under moderate sedation.  During bronchoscopy BAL of left lingula and needle biopsies from station 7 were completed.  Cell count was notable for 9% eosinophilic fluid otherwise bland. Cultures and cytology were negative, no evidence of granulomas.    Since her last encounter in February, patient reports complete resolution of hemoptysis until 1 month ago. She also reports shortness of breath that has worsened in the same timeframe and noticed that she has had to increased her home O2 from 3L to 4L. She has a pulse oximeter at home but does not check her sats. She has had episodes of hemoptysis described as dark brownish, maroon colored mixture of mucous and clots. Reports pleuritic chest pain. These episodes occur every other day  with 2-3 episodes on those days. Seems to be exacerbated after strenuous activity (yard work). Denies unexplained fevers, chills, wheezing.  She has a prior diagnosis of asthma. Has tried Qvar without relief in the past.  Recently prescribed Breo however self-discontinue last month. Not currently taking any inhalers at this time. She was previously followed [facility] from 2016-2018 for her lung nodules (stable) and cough-variant asthma. [facility] notes reviewed including progress note on 09/20/17 and bronchoscopy which comments work-up of her chronic hypoxemic respiratory failure with no evidence of parenchymal disease on CT scans, near-normal PFTs and normal CPET.  CareEverywhere Records Reviewed and Summarized: Pulm at [facility]      09/18/17 Bronchoscopy with BAL. Negative cultures and negative cytology.      Dr. Geraldo Docker documented on 01/31/18 that patient's chest imaging was presented in 10/2017 and her bronchial fibroelastotic scar was felt to be a nonspecific injury related to reflux or prior XRT and not related to her chronic cough. 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  recurrence. Currently has monthly port flushes.      The note also comments on PCP work-up comments on patient reporting hematuria with UA showing 1+ blood however this is not seen on recent PCP note.  IM at [facility] -       Per recent PCP note on 03/17/19, patient previously seen by Cardiology for CHF with right heart failure and pH with EF 35% however EF normalized on echo 11/2018    Social History: Has two adult daughters Cares for her 41 yeard old son  with atypical cerebral palsy  Environmental exposures:  Hx chemotherapy  I have personally reviewed patient's past medical/family/social history, allergies, current medications.  Past Medical History:  Diagnosis Date  . Addison's disease ([place])   . Adrenal insufficiency ([place])   . Anemia   .  Anxiety   . Aortic stenosis   . Aortic stenosis   . Appendicitis 12/19/09  . Breast cancer ([place])    STATUS POST BILATERAL MASTECTOMY. STATUS POST RECONSTRUCTION. SHE HAD SILICONE BREAST IMPLANTS AND THE LEFT IMPLANT IS LEAKING SLIGHTLY  . Cervical cancer ([place]) 12/23/2018  . Chest pain   . CHF with right heart failure ([place]) 04/17/2017  . Chronic respiratory failure with hypoxia ([place]) 12/23/2018  . Depression   . GERD (gastroesophageal reflux disease)   . Headache    migraines  . Heart murmur   . History of kidney stones   . Hodgkin lymphoma (H[facility])    STATUS POST MANTLE RADIATION  . Hodgkin's lymphoma ([place])    1987  . Hypertension   . Hypoxia   . Osteoporosis   . Palpitations   . Pituitary adenoma ([place]) 12/23/2018  . Pneumonia   . PONV (postoperative nausea and vomiting)   . Pre-diabetes    per pt; no meds  . Pulmonary hypertension ([place]) 12/23/2018  . Raynaud phenomenon   . Supplemental oxygen dependent    3 liters  . SVT (supraventricular tachycardia) ([place])   . Tachycardia   . Thyroid cancer ([place])    STATUS POST SURGICAL REMOVAL-CURRENT ON THYROID REPLACEMENT     Family History  Family history unknown: Yes     Social History   Occupational History  . Not on file  Tobacco Use  . Smoking status: Never Smoker  . Smokeless tobacco: Never Used  Substance and Sexual Activity  . Alcohol use: Yes    Comment: occassionally  . Drug use: No  . Sexual activity: Yes    Birth control/protection: Surgical    Allergies  Allergen Reactions  . Na Ferric Gluc Cplx In Sucrose Anaphylaxis  . Cymbalta [Duloxetine Hcl] Swelling and Anxiety  . Hydromorphone Other (See Comments)    BP drop and heart rate drops 5.1.20 PT REPORTS THAT SHE TAKES DILAUDID AT HOME  . Succinylcholine Other (See Comments)    Lock Jaw  . Ativan [Lorazepam] Rash and Other (See Comments)    Dystonia  . Buprenorphine Hcl Hives  . Compazine Other (See Comments)    Altered mental status Aggression  .  Metoclopramide Other (See Comments)    Dystonia  . Morphine And Related Hives  . Ondansetron Hives and Rash  . Promethazine Hcl Hives  . Tegaderm Ag Mesh [Silver] Rash    Old formulation only, is able to tolerate new formulation     Outpatient Medications Prior to Visit  Medication Sig Dispense Refill  . albuterol (VENTOLIN HFA) 108 (90 Base) MCG/ACT inhaler Inhale 2 puffs into the lungs every 6 (six) hours as needed for wheezing or shortness of breath.    . Alum & Mag Hydroxide-Simeth (GI COCKTAIL) SUSP suspension Take 30 mLs daily as needed by mouth for indigestion (BLEEDING ULCERS). Shake well.    . beclomethasone (QVAR) 80 MCG/ACT inhaler Inhale 2 puffs into the lungs 2 (two) times daily.    Marland Kitchen BREO ELLIPTA 200-25 MCG/INH AEPB Inhale 1 puff into the lungs daily.   11  . clonazePAM (KLONOPIN) 1 MG tablet Take 1 mg by mouth 3 (three) times daily.    Marland Kitchen dexlansoprazole (DEXILANT) 60  MG capsule Take 60 mg by mouth daily.      . Diclofenac Potassium (CAMBIA) 50 MG PACK Take 1 packet by mouth 3 (three) times daily as needed (migraines).     . diphenhydrAMINE (BENADRYL) 50 MG/ML injection Inject 1 mL (50 mg total) into the vein every 6 (six) hours as needed (nausea). 25 mL 0  . escitalopram (LEXAPRO) 20 MG tablet Take 20 mg by mouth daily.   1  . estradiol (ESTRACE) 2 MG tablet Take 2 mg by mouth daily.  11  . Ferrous Sulfate Dried (SLOW RELEASE IRON) 45 MG TBCR Take 1 tablet at bedtime by mouth.    . furosemide (LASIX) 20 MG tablet Take 20 mg by mouth daily.     . Galcanezumab-gnlm 120 MG/ML SOAJ Inject into the skin.    . hydrocortisone (CORTEF) 10 MG tablet Take 5-10 mg by mouth See admin instructions. Take 10 mg in the am and 5 mg in the pm    . levothyroxine (SYNTHROID, LEVOTHROID) 112 MCG tablet Take 112 mcg by mouth daily before breakfast.    . metoprolol succinate (TOPROL-XL) 25 MG 24 hr tablet Take 37.5 mg by mouth daily.    . rosuvastatin (CRESTOR) 10 MG tablet Take 10 mg by mouth  daily.     . sucralfate (CARAFATE) 1 g tablet Take 1 g as needed by mouth (FOR ULCERS).    Marland Kitchen topiramate (TOPAMAX) 100 MG tablet Take 150 mg by mouth at bedtime.     . traMADol (ULTRAM) 50 MG tablet Take 50 mg by mouth daily as needed (migraine).     . zolpidem (AMBIEN CR) 12.5 MG CR tablet Take 12.5 mg by mouth at bedtime.      Marland Kitchen OVER THE COUNTER MEDICATION Take 500 mg by mouth daily. Tumeric cucurmin    . HYDROmorphone (DILAUDID) 2 MG tablet Take 1 tablet (2 mg total) by mouth every 4 (four) hours as needed for severe pain. (Patient not taking: Reported on 04/13/2019) 40 tablet 0  . methocarbamol (ROBAXIN) 500 MG tablet Take 1 tablet (500 mg total) by mouth every 6 (six) hours as needed for muscle spasms. (Patient not taking: Reported on 04/13/2019) 60 tablet 0  . senna-docusate (SENOKOT-S) 8.6-50 MG tablet Take 1 tablet by mouth 2 (two) times daily. For constipation (Patient not taking: Reported on 04/13/2019) 60 tablet 0   No facility-administered medications prior to visit.     Review of Systems  Constitutional: Negative for chills, diaphoresis, fever, malaise/fatigue and weight loss.  HENT: Negative for congestion, ear pain and sore throat.   Respiratory: Positive for cough, hemoptysis, sputum production and shortness of breath. Negative for wheezing.   Cardiovascular: Negative for chest pain, palpitations and leg swelling.  Gastrointestinal: Negative for abdominal pain, heartburn and nausea.  Genitourinary: Negative for frequency.  Musculoskeletal: Negative for joint pain and myalgias.  Skin: Negative for itching and rash.  Neurological: Negative for dizziness, weakness and headaches.  Endo/Heme/Allergies: Does not bruise/bleed easily.  Psychiatric/Behavioral: Negative for depression. The patient is not nervous/anxious.      Objective:   Vitals:   04/13/19 1035 04/13/19 1038  BP:  100/62  Pulse:  (!) 103  Temp: 97.6 F (36.4 C)   TempSrc: Oral   SpO2:  99%  Weight: 181 lb  (82.1 kg)   Height: _0  (1.651 m)    SpO2: 99 % O2 Device: Nasal cannula O2 Flow Rate (L/min): 4 L/min O2 Type: Pulse O2  Physical Exam: General: Well-appearing, no  acute distress HENT: [place], AT Eyes: EOMI, no scleral icterus Respiratory: Clear to auscultation bilaterally.  No crackles, wheezing or rales Cardiovascular: RRR, -M/R/G, no JVD GI: BS+, soft, nontender Extremities: Bilateral upper extremity edema. 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 No infiltrate, edema or effusion. CT 05/05/09 - Normal parenchymal. Scattered tiny pulmonary nodules with largest 49m in LUL likely benign CT Chest 09/05/17 (report only): Stable bilateral pulmonary nodules up to 457m(LUL 37m54mLUL 2 mm, RLL subpleural 4mm61mredemonstration of tiny right diaphragmatic hernia containing fat CT Chest 11/10/18 - RML 5 mm lung nodule  PFT: None on file  Per [facility] records: PFTs 09/2015 show no obstruction, small airways dysfunction with evidence of coughing on both inspiratory and expiratory flow-volume limbs. MVV severely reduced (13% predicted) consistent with poor effort (suspect limited by coughing throughout). No restriction. DLCO 75%.   CPET Per [facility] records  CPET 03/2016 showed functional impairment with VO2max6m% predicted. Normal oxygenation throughout on room air with post-exercise PaO2=125. HR 82% predicted. Abnormal FEV1 but ventilatory reserve remained. Conclusion: Cardiovascular limitation to exercise possibly reflecting deconditioning. Ventilatory impairment present but not exercise-limiting.  6 min walk  Per [facility] records (12/07/16): SpO2 started at 97-98% and decreased to ~94% with ambulation. There was one episode of symptomatic dizziness during which SpO2 was 88% but with questionable tracing. Recovered to high-90s with  ~10-15 seconds of standing rest break.  Ambulatory O2 Titration 04/13/19 - Patient unable to complete due to dizziness with ambulation. No desaturations documented. SpO2>95% in the time titration was attempted on room air. Per nursing, patient anxious and holding breath during testing which likely contributed to early termination of test  Labs:  ANA Screen 07/03/17 - Positive  Feb 2020 ANA - neg SSA/SSB - neg Anti-DNA ab -neg MPO/PR3 ab - ng GBM ab - neg Anti-scleroderma ab - neg APLAS - neg ANCA titers - neg RF mildly elevated 18.6  Bronchoscopy: 11/19/18   11/19/18 TBFNA station 7 (mislabeled as bronchial brushing) - negative for malignant cells  11/19/18 Respiratory/fungal, AFB from BAL of L lingula - negative  Imaging, labs and tests noted above have been reviewed independently by me.    Assessment & Plan:   Discussion: 48 ye7 old female with history of Hodgkin's, breast cancer, cervical cancer, precancerous ovarian tumor, history of Cushing secondary to pituitary adenoma, history of thyroid "cancer ", ADHD, anxiety/depression.  Status post multiple chemo radiation treatments with all cancers currently in remission.  [facility], subcentimeter lung nodules (stable), and chronic hypoxemia of unknown etiology.  She reports history of pulmonary fibrosis however on review of imaging no evidence of parenchymal disease on prior reports or chest imaging that I have personally reviewed in our system.  For her recurrence of hemoptysis, etiology is unclear as she does not have any active signs of infection.  Stat CTA was ordered to rule out PE.  I reviewed imaging which demonstrated no evidence of PE, no parenchymal abnormalities or airspace disease to explain her hemoptysis.  Patient is euvolemic.  Previously had history of right heart failure and depressed EF however this has normalized on echocardiogram on 11/2018.  Her  oxygen requirement is difficult to measure due to inability to complete ambulatory testing.  She has had similar issues with [facility].  I  have explained to her that we are unable to provide a prescription for oxygen due to insufficient data.  She is expresses understanding and will obtain records from her current DME.  Shortness of breath, hemoptysis --Coordinated STAT CTA to rule out pulmonary embolism --Will order V/Q scan if negative --Arrange for pulmonary function tests and determine need for bronchodilators  Acute on chronic hypoxemic respiratory failure - increased O2 use to 4L Chronic hypoxemic respiratory failure --Continue supplemental oxygen for goal oxygen levels >90% --Will perform ambulatory oxygen test in-clinic  Bilateral upper extremities -Referred to Rheumatology. Scheduled in October  Greater than 50% of this patient 80-minute office visit was spent face-to-face in counseling with the patient/family. We discussed medical diagnosis and treatment plan as noted.  Orders Placed This Encounter  Procedures  . CT Angio Chest W/Cm &/Or Wo Cm    SLOT OK PER BRITTANY HOLD PATIENT     Standing Status:   Future    Number of Occurrences:   1    Standing Expiration Date:   07/13/2020    Scheduling Instructions:     Patient needs urgently    Order Specific Question:   If indicated for the ordered procedure, I authorize the administration of contrast media per Radiology protocol    Answer:   Yes    Order Specific Question:   Is patient pregnant?    Answer:   No    Order Specific Question:   Preferred imaging location?    Answer:   [facility]    Order Specific Question:   Call Results- Best Contact Number?    Answer:   [phone].  AFTER 5 - CALL [phone]    Order Specific Question:   Radiology Contrast Protocol - do NOT remove file path    Answer:   [email or web address]  No orders of the defined types were placed in this  encounter.   Return in about 3 months (around 07/14/2019).  Vincent, MD [facility] 04/13/2019 4:14 PM  Office Number [phone]

## 2019-04-13 NOTE — Assessment & Plan Note (Signed)
With recurrence. S/p bilateral mastectomy with reconstruction

## 2019-04-13 NOTE — Progress Notes (Signed)
No pulmonary embolism. Updated patient via telephone. Will order V/Q scan to rule out chronic thromboembolism.

## 2019-04-13 NOTE — Patient Instructions (Addendum)
Shortness of breath, hemoptysis --CTA to rule out pulmonary embolism --Will consider V/Q scan if negative --Arrange for pulmonary function tests and determine need for bronchodilators  Chronic hypoxemic respiratory failure --Continue supplemental oxygen for goal oxygen levels >90% --Will perform ambulatory oxygen test in-clinic  We will contact you regarding your test results once they are finalized.    Next in-person visit to be scheduled in 3 months

## 2019-04-14 ENCOUNTER — Other Ambulatory Visit (HOSPITAL_COMMUNITY)
Admission: RE | Admit: 2019-04-14 | Discharge: 2019-04-14 | Disposition: A | Discharge status: Home / Self Care | Payer: BC Managed Care – PPO | Source: Ambulatory Visit | Attending: Pulmonary Disease | Admitting: Pulmonary Disease

## 2019-04-14 ENCOUNTER — Ambulatory Visit: Payer: Medicare Other

## 2019-04-14 DIAGNOSIS — Z1159 Encounter for screening for other viral diseases: Secondary | ICD-10-CM | POA: Diagnosis not present

## 2019-04-14 NOTE — Addendum Note (Signed)
Addended by: Amado Coe on: 04/14/2019 10:38 AM   Modules accepted: Orders

## 2019-04-15 ENCOUNTER — Telehealth: Payer: Self-pay

## 2019-04-15 ENCOUNTER — Other Ambulatory Visit: Payer: Self-pay | Admitting: *Deleted

## 2019-04-15 DIAGNOSIS — J9611 Chronic respiratory failure with hypoxia: Secondary | ICD-10-CM

## 2019-04-15 LAB — SARS CORONAVIRUS 2 (TAT 6-24 HRS): SARS Coronavirus 2: NEGATIVE

## 2019-04-15 MED ORDER — BENZONATATE 200 MG PO CAPS: 200.0000 mg | ORAL_CAPSULE | Freq: Three times a day (TID) | ORAL | 1 refills | Status: DC | PRN

## 2019-04-15 NOTE — Telephone Encounter (Signed)
She has an allergy to morphine in chart Advise delsym cough syrup twice daily Sending in tesslon perles three times a day  She can still have PFTs Advise no NSAID products such as ibuprofen or aleve

## 2019-04-15 NOTE — Telephone Encounter (Signed)
Nothing needed at this time.  

## 2019-04-15 NOTE — Telephone Encounter (Signed)
Any way we can see if this scan can be moved up sooner? Is she on any blood thinners? ASA or ibuprofen? Is she taking anything for cough suppression? Can we take hycodan

## 2019-04-16 ENCOUNTER — Ambulatory Visit (INDEPENDENT_AMBULATORY_CARE_PROVIDER_SITE_OTHER): Payer: BC Managed Care – PPO | Admitting: Pulmonary Disease

## 2019-04-16 ENCOUNTER — Other Ambulatory Visit: Payer: Self-pay

## 2019-04-16 DIAGNOSIS — J9611 Chronic respiratory failure with hypoxia: Secondary | ICD-10-CM | POA: Diagnosis not present

## 2019-04-16 LAB — PULMONARY FUNCTION TEST
DL/VA % pred: 96 %
DL/VA: 4.15 ml/min/mmHg/L
DLCO unc % pred: 75 %
DLCO unc: 16.53 ml/min/mmHg
FEF 25-75 Post: 2.26 L/sec
FEF 25-75 Pre: 1.41 L/sec
FEF2575-%Change-Post: 60 %
FEF2575-%Pred-Post: 78 %
FEF2575-%Pred-Pre: 48 %
FEV1-%Change-Post: 13 %
FEV1-%Pred-Post: 68 %
FEV1-%Pred-Pre: 60 %
FEV1-Post: 2.03 L
FEV1-Pre: 1.79 L
FEV1FVC-%Change-Post: 9 %
FEV1FVC-%Pred-Pre: 94 %
FEV6-%Change-Post: 2 %
FEV6-%Pred-Post: 66 %
FEV6-%Pred-Pre: 64 %
FEV6-Post: 2.41 L
FEV6-Pre: 2.34 L
FEV6FVC-%Change-Post: 0 %
FEV6FVC-%Pred-Post: 102 %
FEV6FVC-%Pred-Pre: 102 %
FVC-%Change-Post: 3 %
FVC-%Pred-Post: 65 %
FVC-%Pred-Pre: 63 %
FVC-Post: 2.42 L
FVC-Pre: 2.34 L
Post FEV1/FVC ratio: 84 %
Post FEV6/FVC ratio: 100 %
Pre FEV1/FVC ratio: 76 %
Pre FEV6/FVC Ratio: 100 %
RV % pred: 97 %
RV: 1.75 L
TLC % pred: 84 %
TLC: 4.4 L

## 2019-04-16 NOTE — Progress Notes (Signed)
PFT done today.

## 2019-04-17 ENCOUNTER — Ambulatory Visit (HOSPITAL_COMMUNITY)
Admission: RE | Admit: 2019-04-17 | Discharge: 2019-04-17 | Disposition: A | Discharge status: Home / Self Care | Payer: BC Managed Care – PPO | Source: Ambulatory Visit | Attending: Pulmonary Disease | Admitting: Pulmonary Disease

## 2019-04-17 ENCOUNTER — Encounter (HOSPITAL_COMMUNITY)
Admission: RE | Admit: 2019-04-17 | Discharge: 2019-04-17 | Disposition: A | Discharge status: Home / Self Care | Payer: BC Managed Care – PPO | Source: Ambulatory Visit | Attending: Pulmonary Disease | Admitting: Pulmonary Disease

## 2019-04-17 DIAGNOSIS — R042 Hemoptysis: Secondary | ICD-10-CM

## 2019-04-17 DIAGNOSIS — R0989 Other specified symptoms and signs involving the circulatory and respiratory systems: Secondary | ICD-10-CM | POA: Diagnosis present

## 2019-04-17 DIAGNOSIS — R0602 Shortness of breath: Secondary | ICD-10-CM | POA: Diagnosis not present

## 2019-04-17 DIAGNOSIS — J9611 Chronic respiratory failure with hypoxia: Secondary | ICD-10-CM

## 2019-04-17 MED ORDER — HEPARIN SOD (PORK) LOCK FLUSH 100 UNIT/ML IV SOLN: 500.0000 [IU] | INTRAVENOUS | Status: DC | PRN

## 2019-04-21 ENCOUNTER — Ambulatory Visit (HOSPITAL_COMMUNITY): Payer: Medicare Other

## 2019-04-22 ENCOUNTER — Ambulatory Visit: Payer: BC Managed Care – PPO | Admitting: Pulmonary Disease

## 2019-04-22 NOTE — Progress Notes (Deleted)
Subjective:   PATIENT ID: [name] GENDER: female DOB: 02-21-1971, MRN: [id number]   HPI  No chief complaint on file.   Reason for Visit: Follow-up  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 She is seen at [facility] by oncology and pulmonary.  She presents as hospital follow-up.  She was seen as a pulmonary consult in February 2020 for hemoptysis.  She had initially presented with bilateral upper extremity edema, pneumonia and small volume hemoptysis.  There was a concern for autoimmune process for which she was being worked up for.  She underwent bronchoscopy on 11/19/2018 under anesthesia after failure to tolerate flexible bronchoscopy under moderate sedation.  During bronchoscopy BAL of left lingula and needle biopsies from station 7 were completed.  Cell count was notable for 9% eosinophilic fluid otherwise bland. Cultures and cytology were negative, no evidence of granulomas.    Since her last encounter in February, patient reports complete resolution of hemoptysis until 1 month ago. She also reports shortness of breath that has worsened in the same timeframe and noticed that she has had to increased her home O2 from 3L to 4L. She has a pulse oximeter at home but does not check her sats. She has had episodes of hemoptysis described as dark brownish, maroon colored mixture of mucous and clots. Reports pleuritic chest pain. These episodes occur every other day with 2-3 episodes on those days. Seems to  be exacerbated after strenuous activity (yard work). Denies unexplained fevers, chills, wheezing.  She has a prior diagnosis of asthma. Has tried Qvar without relief in the past.  Recently prescribed Breo however self-discontinue last month. Not currently taking any inhalers at this time. She was previously followed [facility] from 2016-2018 for her lung nodules (stable) and cough-variant asthma. [facility] notes reviewed including progress note on 09/20/17 and bronchoscopy which comments work-up of her chronic hypoxemic respiratory failure with no evidence of parenchymal disease on CT scans, near-normal PFTs and normal CPET.  CareEverywhere Records Reviewed and Summarized: Pulm at [facility]      09/18/17 Bronchoscopy with BAL. Negative cultures and negative cytology.      Dr. Geraldo Docker documented on 01/31/18 that patient's chest imaging was presented in 10/2017 and her bronchial fibroelastotic scar was felt to be a nonspecific injury related to reflux or prior XRT and not related to her chronic cough. 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  recurrence. Currently has monthly port flushes.      The note also comments on PCP work-up comments on patient reporting hematuria with UA showing 1+ blood however this is not seen on recent PCP note.  IM at [facility] -       Per recent PCP note on 03/17/19, patient previously seen by Cardiology for CHF with right heart failure and pH with EF 35% however EF normalized on echo 11/2018    Social History: Has two adult daughters Cares for her 49 yeard old son with atypical cerebral palsy  Environmental  exposures:  Hx chemotherapy  I have personally reviewed patient's past medical/family/social history, allergies, current medications.  Past Medical History:  Diagnosis Date   Addison's disease ([place])    Adrenal insufficiency ([place])    Anemia    Anxiety    Aortic stenosis    Aortic  stenosis    Appendicitis 12/19/09   Appendicitis    Breast cancer (HCC)    STATUS POST BILATERAL MASTECTOMY. STATUS POST RECONSTRUCTION. SHE HAD SILICONE BREAST IMPLANTS AND THE LEFT IMPLANT IS LEAKING SLIGHTLY   Cellulitis of right middle finger 11/07/2018   Cervical cancer ([place]) 12/23/2018   Chest pain    CHF with right heart failure ([place]) 04/17/2017   Chronic respiratory failure with hypoxia (HCC) 12/23/2018   Cough variant asthma 04/13/2019   Depression    GERD (gastroesophageal reflux disease)    Headache    migraines   Heart murmur    History of kidney stones    Hodgkin lymphoma ([place])    STATUS POST MANTLE RADIATION   Hodgkin's lymphoma ([place])    1987   Hypertension    Hypoxia    Necrotizing fasciitis ([place]) 12/23/2018   Non-ischemic cardiomyopathy ([place])    Osteoporosis    Palpitations    Pituitary adenoma ([place]) 12/23/2018   Pneumonia    PONV (postoperative nausea and vomiting)    Pre-diabetes    per pt; no meds   Pulmonary hypertension ([place]) 12/23/2018   Raynaud phenomenon    Right heart failure ([place]) 04/17/2017   Supplemental oxygen dependent    3 liters   SVT (supraventricular tachycardia) (HCC)    Tachycardia    Thyroid cancer ([place])    STATUS POST SURGICAL REMOVAL-CURRENT ON THYROID REPLACEMENT     Family History  Family history unknown: Yes     Social History   Occupational History   Not on file  Tobacco Use   Smoking status: Never Smoker   Smokeless tobacco: Never Used  Substance and Sexual Activity   Alcohol use: Yes    Comment: occassionally   Drug use: No   Sexual activity: Yes    Birth control/protection: Surgical    Allergies  Allergen Reactions   Na Ferric Gluc Cplx In Sucrose Anaphylaxis   Cymbalta [Duloxetine Hcl] Swelling and Anxiety   Hydromorphone Other (See Comments)    BP drop and heart rate drops 5.1.20 PT REPORTS THAT SHE TAKES DILAUDID AT HOME   Succinylcholine Other (See Comments)    Lock  Jaw   Ativan [Lorazepam] Rash and Other (See Comments)    Dystonia   Buprenorphine Hcl Hives   Compazine Other (See Comments)    Altered mental status Aggression   Metoclopramide Other (See Comments)    Dystonia   Morphine And Related Hives   Ondansetron Hives and Rash   Promethazine Hcl Hives   Tegaderm Ag Mesh [Silver] Rash    Old formulation only, is able to tolerate new formulation     Outpatient Medications Prior to Visit  Medication Sig Dispense Refill   albuterol (VENTOLIN HFA) 108 (90 Base) MCG/ACT inhaler Inhale 2 puffs into the lungs every 6 (six) hours as needed for wheezing or shortness of breath.     Alum & Mag Hydroxide-Simeth (GI COCKTAIL) SUSP suspension Take 30 mLs daily as needed by mouth for indigestion (BLEEDING ULCERS). Shake well.     beclomethasone (QVAR) 80 MCG/ACT inhaler Inhale 2 puffs into the lungs 2 (two) times daily.     benzonatate (TESSALON) 200 MG capsule Take 1  capsule (200 mg total) by mouth 3 (three) times daily as needed for cough. 45 capsule 1   clonazePAM (KLONOPIN) 1 MG tablet Take 1 mg by mouth 3 (three) times daily.     dexlansoprazole (DEXILANT) 60 MG capsule Take 60 mg by mouth daily.       Diclofenac Potassium (CAMBIA) 50 MG PACK Take 1 packet by mouth 3 (three) times daily as needed (migraines).      diphenhydrAMINE (BENADRYL) 50 MG/ML injection Inject 1 mL (50 mg total) into the vein every 6 (six) hours as needed (nausea). 25 mL 0   escitalopram (LEXAPRO) 20 MG tablet Take 20 mg by mouth daily.   1   estradiol (ESTRACE) 2 MG tablet Take 2 mg by mouth daily.  11   Ferrous Sulfate Dried (SLOW RELEASE IRON) 45 MG TBCR Take 1 tablet at bedtime by mouth.     furosemide (LASIX) 20 MG tablet Take 20 mg by mouth daily.      Galcanezumab-gnlm 120 MG/ML SOAJ Inject into the skin.     hydrocortisone (CORTEF) 10 MG tablet Take 5-10 mg by mouth See admin instructions. Take 10 mg in the am and 5 mg in the pm     levothyroxine  (SYNTHROID, LEVOTHROID) 112 MCG tablet Take 112 mcg by mouth daily before breakfast.     metoprolol succinate (TOPROL-XL) 25 MG 24 hr tablet Take 37.5 mg by mouth daily.     OVER THE COUNTER MEDICATION Take 500 mg by mouth daily. Tumeric cucurmin     rosuvastatin (CRESTOR) 10 MG tablet Take 10 mg by mouth daily.      sucralfate (CARAFATE) 1 g tablet Take 1 g as needed by mouth (FOR ULCERS).     topiramate (TOPAMAX) 100 MG tablet Take 150 mg by mouth at bedtime.      traMADol (ULTRAM) 50 MG tablet Take 50 mg by mouth daily as needed (migraine).      zolpidem (AMBIEN CR) 12.5 MG CR tablet Take 12.5 mg by mouth at bedtime.       Facility-Administered Medications Prior to Visit  Medication Dose Route Frequency Provider Last Rate Last Dose   heparin lock flush 100 unit/mL  500 Units Intracatheter Prior to discharge Margaretha Seeds, MD        Review of Systems  Constitutional: Negative for chills, diaphoresis, fever, malaise/fatigue and weight loss.  HENT: Negative for congestion, ear pain and sore throat.   Respiratory: Positive for cough, hemoptysis, sputum production and shortness of breath. Negative for wheezing.   Cardiovascular: Negative for chest pain, palpitations and leg swelling.  Gastrointestinal: Negative for abdominal pain, heartburn and nausea.  Genitourinary: Negative for frequency.  Musculoskeletal: Negative for joint pain and myalgias.  Skin: Negative for itching and rash.  Neurological: Negative for dizziness, weakness and headaches.  Endo/Heme/Allergies: Does not bruise/bleed easily.  Psychiatric/Behavioral: Negative for depression. The patient is not nervous/anxious.      Objective:   There were no vitals filed for this visit.    Physical Exam: General: Well-appearing, no acute distress HENT: [place], AT Eyes: EOMI, no scleral icterus Respiratory: Clear to auscultation bilaterally.  No crackles, wheezing or rales Cardiovascular: RRR, -M/R/G, no JVD GI: BS+,  soft, nontender Extremities: Bilateral upper extremity edema. 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 No infiltrate, edema or effusion. CT  05/05/09 - Normal parenchymal. Scattered tiny pulmonary nodules with largest 27m in LUL likely benign CT Chest 09/05/17 (report only): Stable bilateral pulmonary nodules up to 430m(LUL 81m19mLUL 2 mm, RLL subpleural 4mm47mredemonstration of tiny right diaphragmatic hernia containing fat CT Chest 11/10/18 - RML 5 mm lung nodule  PFT: None on file  Per [facility] records: PFTs 09/2015 show no obstruction, small airways dysfunction with evidence of coughing on both inspiratory and expiratory flow-volume limbs. MVV severely reduced (13% predicted) consistent with poor effort (suspect limited by coughing throughout). No restriction. DLCO 75%.   CPET Per [facility] records  CPET 03/2016 showed functional impairment with VO2max26m% predicted. Normal oxygenation throughout on room air with post-exercise PaO2=125. HR 82% predicted. Abnormal FEV1 but ventilatory reserve remained. Conclusion: Cardiovascular limitation to exercise possibly reflecting deconditioning. Ventilatory impairment present but not exercise-limiting.  6 min walk  Per [facility] records (12/07/16): SpO2 started at 97-98% and decreased to ~94% with ambulation. There was one episode of symptomatic dizziness during which SpO2 was 88% but with questionable tracing. Recovered to high-90s with ~10-15 seconds of standing rest break.  Ambulatory O2 Titration 04/13/19 - Patient unable to complete due to dizziness with ambulation. No desaturations documented. SpO2>95% in the time titration was attempted on room air. Per nursing, patient anxious and holding breath during testing which likely contributed to early termination of test  Labs:  ANA Screen  07/03/17 - Positive  Feb 2020 ANA - neg SSA/SSB - neg Anti-DNA ab -neg MPO/PR3 ab - ng GBM ab - neg Anti-scleroderma ab - neg APLAS - neg ANCA titers - neg RF mildly elevated 18.6  Bronchoscopy: 11/19/18   11/19/18 TBFNA station 7 (mislabeled as bronchial brushing) - negative for malignant cells  11/19/18 Respiratory/fungal, AFB from BAL of L lingula - negative  Imaging, labs and tests noted above have been reviewed independently by me.    Assessment & Plan:   Discussion: 48 ye61 old female with history of Hodgkin's, breast cancer, cervical cancer, precancerous ovarian tumor, history of Cushing secondary to pituitary adenoma, history of thyroid "cancer ", ADHD, anxiety/depression.  Status post multiple chemo radiation treatments with all cancers currently in remission.  [facility], subcentimeter lung nodules (stable), and chronic hypoxemia of unknown etiology.  She reports history of pulmonary fibrosis however on review of imaging no evidence of parenchymal disease on prior reports or chest imaging that I have personally reviewed in our system.  For her recurrence of hemoptysis, etiology is unclear as she does not have any active signs of infection.  Stat CTA was ordered to rule out PE.  I reviewed imaging which demonstrated no evidence of PE, no parenchymal abnormalities or airspace disease to explain her hemoptysis.  Patient is euvolemic.  Previously had history of right heart failure and depressed EF however this has normalized on echocardiogram on 11/2018.  Her oxygen requirement is difficult to measure due to inability to complete ambulatory testing.  She has had similar issues with [facility].  I have explained to her that we are unable to provide a prescription for oxygen due to insufficient data.  She is expresses understanding and will obtain records from her current DME.  Shortness of breath,  hemoptysis --Coordinated STAT CTA to rule out pulmonary embolism --Will order V/Q scan if negative --Arrange for pulmonary function tests and determine need for bronchodilators  Acute on chronic hypoxemic respiratory failure - increased O2 use to 4L Chronic hypoxemic respiratory failure --  Continue supplemental oxygen for goal oxygen levels >90% --Will perform ambulatory oxygen test in-clinic  Bilateral upper extremities -Referred to Rheumatology. Scheduled in October  Greater than 50% of this patient 80-minute office visit was spent face-to-face in counseling with the patient/family. We discussed medical diagnosis and treatment plan as noted.  No orders of the defined types were placed in this encounter. No orders of the defined types were placed in this encounter.   No follow-ups on file.  Catalina [place], MD [facility] 04/22/2019 1:16 PM  Office Number [phone]

## 2019-04-23 ENCOUNTER — Encounter: Payer: Self-pay | Admitting: Pulmonary Disease

## 2019-04-23 ENCOUNTER — Ambulatory Visit (INDEPENDENT_AMBULATORY_CARE_PROVIDER_SITE_OTHER): Payer: BC Managed Care – PPO | Admitting: Pulmonary Disease

## 2019-04-23 ENCOUNTER — Other Ambulatory Visit: Payer: Self-pay

## 2019-04-23 DIAGNOSIS — J4550 Severe persistent asthma, uncomplicated: Secondary | ICD-10-CM | POA: Diagnosis not present

## 2019-04-23 DIAGNOSIS — Z7952 Long term (current) use of systemic steroids: Secondary | ICD-10-CM | POA: Diagnosis not present

## 2019-04-23 DIAGNOSIS — J9611 Chronic respiratory failure with hypoxia: Secondary | ICD-10-CM | POA: Diagnosis not present

## 2019-04-23 NOTE — Progress Notes (Signed)
Virtual Visit via Telephone Note  I connected with [name] on 04/23/19 at 10:45 AM EDT by telephone and verified that I am speaking with the correct person using two identifiers.  Location: Patient: [name]. [name] Provider: Rodman Pickle, MD   I discussed the limitations, risks, security and privacy concerns of performing an evaluation and management service by telephone and the availability of in person appointments. I also discussed with the patient that there may be a patient responsible charge related to this service. The patient expressed understanding and agreed to proceed.   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  She presents for follow-up for her dyspnea. Reports her hemoptysis as improved but continues to have persistent shortness of breath. This week, her dyspnea has worsened due to humidity. Associated with cough. No wheezing. Compliant with Breo 200 1 puff daily and Qvar 80 1 puff BID. Reports chronic fatigue. Reports hx of anaphylaxis to IV iron.     Observations/Objective: Speaking full sentences without difficulty. No acute distress. Appropriate conversation, oriented x 4.  CTA 04/13/19 - No PE. No evidence of interstitial thickening or fibrosis. R PAC in place.  V/Q 04/17/19 - No perfusion defects evident. Low probability of PE.  PFT 04/16/19 FVC 2.42 (63%) FEV1 2.03 (68%) Ratio 76  TLC 84% DLCO 75% Interpretation: Moderate obstructive defect with  significant bronchodilator response. Normal TLC and mildly reduced DLCO.  Lung Pathology at [facility] 09/18/17 A. Lung, right lower lobe, endobronchial biopsy:  Bronchial tissue with mild subepithelial fibroelastosis. No evidence of infection, granulomatous inflammation, or malignancy.  Labs reviewed including CBC in 2020 significant for Absolute Eos 300 (11/15/18)  Assessment and Plan: 48 year old female with moderate asthma. Work-up for dyspnea with negative CTA and V/Q. She is on high dose ICS and still demonstrating eosinophilia (9%) on recent bronchoscopy in setting of chronic steroid use. Her isolated symptom of dyspnea could represent uncontrolled allergic-type asthma. Will consider starting biologic agent. I will correspond with Pulmonary Pharmacy regarding drug contraindications with Nucala and galcanezumab-gnlm (EMGALITY PEN )before initiating therapy. We also discussed benefit of Pulmonary rehab.  Staff message sent to pharmacy Refer to Pulmonary Rehab  Follow Up Instructions: Follow up in 3 months  I discussed the assessment and treatment plan with the patient. The patient was provided an opportunity to ask questions and all were answered. The patient agreed with the plan and demonstrated an understanding of the instructions.   The patient was advised to call back or seek an in-person evaluation if the symptoms worsen or if the condition fails to improve as anticipated.  I provided 31 minutes of non-face-to-face time during this encounter.   [name] Rodman Pickle, MD

## 2019-04-23 NOTE — Patient Instructions (Signed)
Refer to Pulmonary Rehab  Follow-up in 3 months

## 2019-04-24 ENCOUNTER — Telehealth: Payer: Self-pay | Admitting: Pharmacist

## 2019-04-24 NOTE — Telephone Encounter (Signed)
Request to review medications for drug interactions and contraindications to Nucala per Dr. Loanne Drilling.  Also concern with Nucala having any iron components as patient had anaphylaxis reaction to IV iron in the past.  No drug interactions with Nucala identified with current medication regimen listed in Epic.  Nucala does not list any iron components looking at the inactive ingredients.  She does not have an contraindications to starting Nucala as well.    Will assist with insurance approval and coverage if needed.  Mariella Saa, PharmD, [place], [place] Clinical Specialty Pharmacist [phone]  04/24/2019 9:11 AM

## 2019-04-28 ENCOUNTER — Telehealth: Payer: Self-pay | Admitting: Pulmonary Disease

## 2019-04-28 NOTE — Telephone Encounter (Signed)
Attempted to call pt but unable to reach.left message for pt to return call.

## 2019-04-28 NOTE — Telephone Encounter (Signed)
Patient is returning phone call.  Patient phone number is [phone] c or [phone] c.

## 2019-04-28 NOTE — Telephone Encounter (Signed)
Spoke with patient. Advised her that the pulmonary rehab referral is still pending review from the rehab department. Also advised her that if Dr. Loanne Drilling wants her to start Nucala, an enrollment form will needed to be completed.   Dr. Loanne Drilling, please if you wish to start the enrollment process for Nucala. Thanks!

## 2019-04-30 ENCOUNTER — Telehealth (HOSPITAL_COMMUNITY): Payer: Self-pay

## 2019-04-30 NOTE — Telephone Encounter (Signed)
Pt insurance is active and benefits verified through [employer] Co-pay 0, DED $400/$400 met, out of pocket $2,200/$2,200 met, co-insurance 20%. no pre-authorization required, REF# [id number]  2ndary insurance is active and benefits verified through Medicare a/b. Co-pay 0, DED $198/$198 met, out of pocket 0/0 met, co-insurance 20%. No pre-authorization required. Passport, 04/30/2019 @ 1:31pm, REF# [id number]

## 2019-05-01 NOTE — Telephone Encounter (Signed)
Dr. Loanne Drilling would you like [place] to start the process to get this patient started on Nucala?  Please advise.

## 2019-05-04 DIAGNOSIS — E271 Primary adrenocortical insufficiency: Secondary | ICD-10-CM | POA: Diagnosis not present

## 2019-05-04 DIAGNOSIS — M7502 Adhesive capsulitis of left shoulder: Secondary | ICD-10-CM | POA: Diagnosis not present

## 2019-05-04 DIAGNOSIS — S6992XD Unspecified injury of left wrist, hand and finger(s), subsequent encounter: Secondary | ICD-10-CM | POA: Diagnosis not present

## 2019-05-04 DIAGNOSIS — L03012 Cellulitis of left finger: Secondary | ICD-10-CM | POA: Diagnosis not present

## 2019-05-04 NOTE — Telephone Encounter (Signed)
Yes, please initiate process for nucala. Please schedule her with pharmacy to discuss medication and risks. She has had anaphylaxis in the past with other medications but I spoke with our clinical pharmacy team who reviewed her meds and did not see a contraindication or cross-reactivity with Nucala and her past/current medications. I believe it would benefit both the patient and [place] for patient to have a formal discussion and clinic visit with pharmacy about potential risks and benefits of medication.

## 2019-05-05 NOTE — Telephone Encounter (Signed)
Ria Comment do I need to start the paperwork and bring to you or do you get everything started?

## 2019-05-06 ENCOUNTER — Telehealth: Payer: Self-pay | Admitting: Pharmacist

## 2019-05-06 DIAGNOSIS — J4550 Severe persistent asthma, uncomplicated: Secondary | ICD-10-CM

## 2019-05-06 NOTE — Telephone Encounter (Signed)
Paperwork is typically started by whoever is working the physician and then give to me.

## 2019-05-06 NOTE — Telephone Encounter (Signed)
Prior auth application submitted for Nucala (ref# [id number]), contacted patient to review therapy but had to leave message.

## 2019-05-07 DIAGNOSIS — S6992XD Unspecified injury of left wrist, hand and finger(s), subsequent encounter: Secondary | ICD-10-CM | POA: Diagnosis not present

## 2019-05-07 DIAGNOSIS — M7502 Adhesive capsulitis of left shoulder: Secondary | ICD-10-CM | POA: Diagnosis not present

## 2019-05-07 DIAGNOSIS — L03012 Cellulitis of left finger: Secondary | ICD-10-CM | POA: Diagnosis not present

## 2019-05-08 MED ORDER — NUCALA 100 MG ~~LOC~~ SOLR: 100.0000 mg | SUBCUTANEOUS | 3 refills | Status: DC

## 2019-05-08 NOTE — Progress Notes (Signed)
Nucala prior auth was approved. Tried contacting patient to schedule pharmacy visit but unable to reach (left message to call back). PA information:  [phone] TDD/TTY: [id number] Notice of Approval Date: 05/06/2019 [place] [address] [place], [place] [address] Plan Member Name: [name] Plan Member ID: [id number] [id number] Prescriber Name: [facility] Rodman Pickle Prescriber Phone: [phone] Prescriber Fax: [phone]  Dear [name]: [facility] received a request for coverage of Nucala for you. As long as you remain covered by your prescription drug plan and there are no changes to your plan benefits, this request is approved for the following time period: 05/06/2019 - 11/06/2019  The prescription drug plan requires that this medication be filled [facility]. If you have not done so already, a prescription can be faxed to [phone] along with a copy of this letter and the request will be processed.  [place] TDD/TTY: [phone] Sincerely, [facility] cc: Dr. Darlis Loan [facility] PA# [place] [phone] [place]

## 2019-05-11 MED ORDER — EPINEPHRINE 0.3 MG/0.3ML IJ SOAJ: 0.3000 mg | INTRAMUSCULAR | 3 refills | Status: DC | PRN

## 2019-05-11 NOTE — Telephone Encounter (Signed)
Lauren, please advise if you have started the paperwork for pt's nucala start or if this is still needing to be done. Thanks!

## 2019-05-11 NOTE — Telephone Encounter (Signed)
Spoke with patient. Let her know she needs to come in and have her paperwork signed. She mentioned that Flossie Dibble pharmacist has helped her already get started on this. I am going to have the papers upfront for pick up tomorrow 05/12/19 before 1000. I will route to Flossie Dibble to see what all she has already done for patient.

## 2019-05-12 NOTE — Telephone Encounter (Signed)
Patient came by office this morning and signed [place] [employer] [place], Patient assistance. Patient stated she would be in same day surgery with spouse today, but could be reached later this week. Patient was expecting injection Friday, but is not scheduled at this time.  Will check with pharmacy tomorrow and contact patient.

## 2019-05-13 MED ORDER — NUCALA 100 MG ~~LOC~~ SOLR: 100.0000 mg | SUBCUTANEOUS | 3 refills | Status: DC

## 2019-05-13 NOTE — Addendum Note (Signed)
Addended by: Forde Dandy on: 05/13/2019 11:42 AM   Modules accepted: Orders

## 2019-05-13 NOTE — Addendum Note (Signed)
Addended by: Forde Dandy on: 05/13/2019 12:52 PM   Modules accepted: Orders

## 2019-05-20 ENCOUNTER — Telehealth: Payer: Self-pay | Admitting: Pulmonary Disease

## 2019-05-20 ENCOUNTER — Telehealth: Payer: Self-pay | Admitting: Pharmacist

## 2019-05-20 DIAGNOSIS — I7300 Raynaud's syndrome without gangrene: Secondary | ICD-10-CM

## 2019-05-20 NOTE — Telephone Encounter (Signed)
Pt returning call and can be reached @ [phone].[name]

## 2019-05-20 NOTE — Telephone Encounter (Signed)
ATC Patient to schedule first Nucala injection.  Left message to call back.

## 2019-05-20 NOTE — Telephone Encounter (Signed)
Good Afternoon,    I spoke with Dr. Pricilla Larsson office today (Rheumatology), and they said she would take me on as a new patient, but only if another physician referred me and in their referral could basically tell her why I needed to be seen by rheumatology. This might be my only chance to get in ASAP. Plus she is in the [facility] system, and you could send the referral electronically. Also, Anderson Malta and the pharmacy team have been waiting for the attached insurance approval letter, so they can send the Nucala prescription to my pharmacy. Hoping to get my first injection next week. Thank you so much for everything. BTW - had another finger that tried to go bad on me, but we got to it in time. It's going to be scarred pretty bad :-(    [name]  [phone]   Dr. Loanne Drilling, please advise if you are ok with [place] placing this referral.

## 2019-05-21 NOTE — Telephone Encounter (Signed)
Will forward to the injection pool to look out for fax.

## 2019-05-21 NOTE — Telephone Encounter (Signed)
Please place referral for Rheumatology with patient's requested physician for "bilateral upper extremity of unknown etiology and intermittent hemoptysis, concerned for autoimmune process."   JE

## 2019-05-21 NOTE — Telephone Encounter (Signed)
Duplicate message.

## 2019-05-21 NOTE — Addendum Note (Signed)
Addended by: Collier Salina on: 05/21/2019 02:35 PM   Modules accepted: Orders

## 2019-05-21 NOTE — Telephone Encounter (Signed)
Will route to injection pool to follow up on paperwork was signed

## 2019-05-21 NOTE — Telephone Encounter (Signed)
Per the pharmacy's documentation, the prescription for Nucala was sent to [facility]. Called [facility]. They needed clarification on whether the prescription was for single dose vials or an autoinjector. Advised the prescription needs to be for a single dose vial. [facility] is going process this prescription and call us back to set up shipment.  Spoke with pt. She is aware of this information. Will continue to follow up.

## 2019-05-22 DIAGNOSIS — Z48812 Encounter for surgical aftercare following surgery on the circulatory system: Secondary | ICD-10-CM | POA: Diagnosis not present

## 2019-05-22 DIAGNOSIS — Z95828 Presence of other vascular implants and grafts: Secondary | ICD-10-CM | POA: Diagnosis not present

## 2019-05-22 NOTE — Telephone Encounter (Signed)
I spoke with the pt yesterday via telephone. This information has been documented in the pt's chart by Anderson Malta from the pharmacy team. No fax is needed at this time.

## 2019-05-22 NOTE — Telephone Encounter (Addendum)
Spoke with pt. She states she spoke with [facility]. The medication should be arriving at our office on 05/26/2019.  Pt's first injection has been scheduled for 05/27/2019 at 1330. She is aware of our office's policy >> 2 hour wait, Epipen.

## 2019-05-22 NOTE — Telephone Encounter (Signed)
No new updates.

## 2019-05-25 DIAGNOSIS — L03012 Cellulitis of left finger: Secondary | ICD-10-CM | POA: Diagnosis not present

## 2019-05-25 DIAGNOSIS — S60425A Blister (nonthermal) of left ring finger, initial encounter: Secondary | ICD-10-CM | POA: Diagnosis not present

## 2019-05-26 DIAGNOSIS — M79644 Pain in right finger(s): Secondary | ICD-10-CM | POA: Diagnosis not present

## 2019-05-27 ENCOUNTER — Ambulatory Visit: Payer: BC Managed Care – PPO

## 2019-05-27 NOTE — Telephone Encounter (Signed)
[facility] Shipment Received:  60m #1 prefilled syringe Medication arrival date: 05/27/19 Lot #: [id number] Exp date: 07/01/2022 Received by: LTonna Corner

## 2019-05-28 DIAGNOSIS — M79644 Pain in right finger(s): Secondary | ICD-10-CM | POA: Diagnosis not present

## 2019-05-28 DIAGNOSIS — L03012 Cellulitis of left finger: Secondary | ICD-10-CM | POA: Diagnosis not present

## 2019-05-28 DIAGNOSIS — M25641 Stiffness of right hand, not elsewhere classified: Secondary | ICD-10-CM | POA: Diagnosis not present

## 2019-05-29 DIAGNOSIS — R911 Solitary pulmonary nodule: Secondary | ICD-10-CM | POA: Diagnosis not present

## 2019-05-29 NOTE — Progress Notes (Signed)
Office Visit Note  Patient: [name]             Date of Birth: May 13, 1971           MRN: [id number]             PCP: Su Grand, MD Referring: Margaretha Seeds, MD Visit Date: 06/02/2019 Occupation: On disability  Subjective:  Raynauds phenomenon.   History of Present Illness: [name] is a 48 y.o. female seen in consultation per request of Dr. Loanne Drilling.  According to patient about 10 years ago she was diagnosed with Raynauds by her cardiologist.  She states that at age 71 she was diagnosed with Hodgkin's lymphoma and was treated with different regimen.  In August 2016 she started having some foreign body sensation in her throat and had CT scan and was found to have multiple lymph nodes in her chest.  Which have stayed stable.  She is also been seeing a cardiologist who has told her that she has decreased ejection fraction due to prior use of Adriamycin.  She states in December 2016 she started experiencing increased fatigue and shortness of breath.  At the time she was seen by cardiothoracic person at [facility] and was diagnosed with chronic respiratory failure due to bleomycin.  She has been on oxygen since then.  She states in February 2020 she was admitted to the [facility] due to digital ulcer which was on her right middle finger.  She was under care of Dr. Amedeo Plenty who started her on IV antibiotics.  In the meantime she developed left upper quadrant pneumonia and was started on IV antibiotics.  Since then she has been under care of Dr. Rodman Pickle who did a bronchoscopy due to chronic hemoptysis.  She diagnosed her with eosinophilic asthma and pulmonary fibrosis.  The treatment has not started yet.  She states in the meantime the ulcer on her right middle finger healed.  Since then she has developed other lesions.  She developed an ulcer on her left index finger after a tendon repair for an injury.  The ulcer got deep enough that her index finger had to be amputated.  She  has showed me photographs on her cell phone which shows that she develops bullous lesion which becomes deep and infected.  Currently she is dealing with an infected bullous lesion on her right ring finger.  She also has few bullous lesions on her hands and her feet.  She has a Port-A-Cath now because of difficulty of accessing veins.    Activities of Daily Living:  Patient reports morning stiffness for all day hours.   Patient Denies nocturnal pain.  Difficulty dressing/grooming: Denies Difficulty climbing stairs: Reports Difficulty getting out of chair: Reports Difficulty using hands for taps, buttons, cutlery, and/or writing: Denies  Review of Systems  Constitutional: Positive for fatigue. Negative for night sweats, weight gain and weight loss.  HENT: Negative for mouth sores, trouble swallowing, trouble swallowing, mouth dryness and nose dryness.   Eyes: Negative for pain, redness, visual disturbance and dryness.  Respiratory: Positive for shortness of breath. Negative for cough and difficulty breathing.   Cardiovascular: Negative for chest pain, palpitations, hypertension, irregular heartbeat and swelling in legs/feet.  Gastrointestinal: Negative for blood in stool, constipation and diarrhea.  Endocrine: Negative for increased urination.  Genitourinary: Negative for vaginal dryness.  Musculoskeletal: Positive for arthralgias, joint pain, myalgias, morning stiffness and myalgias. Negative for joint swelling, muscle weakness and muscle tenderness.  Skin: Positive for color change and rash. Negative for hair loss, skin tightness, ulcers and sensitivity to sunlight.  Allergic/Immunologic: Negative for susceptible to infections.  Neurological: Negative for dizziness, memory loss, night sweats and weakness.  Hematological: Negative for swollen glands.  Psychiatric/Behavioral: Positive for depressed mood and sleep disturbance. The patient is nervous/anxious.     PMFS History:  Patient  Active Problem List   Diagnosis Date Noted   Cough variant asthma 04/13/2019   Injury of left index finger 12/24/2018   Pituitary adenoma ([place]) 12/23/2018   History of cervical cancer 12/23/2018   Chronic respiratory failure with hypoxia (HCC) 12/23/2018   SOB (shortness of breath)    Hemoptysis 11/11/2018   Nephrolithiasis 11/07/2018   Oxygen dependent 11/07/2018   Migraine 11/07/2018   PVC's (premature ventricular contractions) 10/27/2018   Dizziness 11/16/2014   Hyperlipidemia 03/09/2014   Hx of Hodgkins lymphoma    History of ductal carcinoma in situ (DCIS) of breast    Thyroid cancer ([place])    Adrenal insufficiency (HCC)    Hypertension    Raynaud phenomenon    GERD (gastroesophageal reflux disease)     Past Medical History:  Diagnosis Date   Addison's disease ([place])    Adrenal insufficiency ([place])    Anemia    Anxiety    Aortic stenosis    Aortic stenosis    Appendicitis 12/19/09   Appendicitis    Breast cancer (HCC)    STATUS POST BILATERAL MASTECTOMY. STATUS POST RECONSTRUCTION. 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   Chronic respiratory failure with hypoxia (HCC) 12/23/2018   Cough variant asthma 04/13/2019   Depression    GERD (gastroesophageal reflux disease)    Headache    migraines   Heart murmur    History of kidney stones    Hodgkin lymphoma ([place])    STATUS POST MANTLE RADIATION   Hodgkin's lymphoma ([place])    1987   Hypertension    Hypoxia    Necrotizing fasciitis ([place]) 12/23/2018   Non-ischemic cardiomyopathy ([place])    Osteoporosis    Palpitations    Pituitary adenoma ([place]) 12/23/2018   Pneumonia    PONV (postoperative nausea and vomiting)    Pre-diabetes    per pt; no meds   Pulmonary hypertension ([place]) 12/23/2018    Raynaud phenomenon    Right heart failure ([place]) 04/17/2017   Supplemental oxygen dependent    3 liters   SVT (supraventricular tachycardia) (HCC)    Tachycardia    Thyroid cancer ([place])    STATUS POST SURGICAL REMOVAL-CURRENT ON THYROID REPLACEMENT    Family History  Family history unknown: Yes   Past Surgical History:  Procedure Laterality Date   ABDOMINAL HYSTERECTOMY     AMPUTATION Left 01/30/2019   Procedure: Left Index finger amputation with flap reconstruction and repair reconstruction;  Surgeon: Roseanne Kaufman, MD;  Location: [place];  Service: Orthopedics;  Laterality: Left;   APPENDECTOMY     CARDIAC CATHETERIZATION  05/18/09   NORMAL CATH   I&D EXTREMITY Left 12/23/2018   Procedure: IRRIGATION AND DEBRIDEMENT HAND / INDEX FINGER;  Surgeon: Roseanne Kaufman, MD;  Location: [facility];  Service: Orthopedics;  Laterality: Left;   MASTECTOMY     PITUITARY SURGERY     RIGHT/LEFT HEART CATH AND CORONARY ANGIOGRAPHY N/A 04/02/2018  Procedure: RIGHT/LEFT HEART CATH AND CORONARY ANGIOGRAPHY;  Surgeon: Burnell Blanks, MD;  Location: [facility];  Service: Cardiovascular;  Laterality: N/A;   TOTAL THYROIDECTOMY     VIDEO BRONCHOSCOPY Bilateral 11/14/2018   Procedure: VIDEO BRONCHOSCOPY WITHOUT FLUORO;  Surgeon: Margaretha Seeds, MD;  Location: [facility];  Service: Cardiopulmonary;  Laterality: Bilateral;   VIDEO BRONCHOSCOPY WITH ENDOBRONCHIAL ULTRASOUND N/A 11/19/2018   Procedure: VIDEO BRONCHOSCOPY WITH ENDOBRONCHIAL ULTRASOUND;  Surgeon: Margaretha Seeds, MD;  Location: [facility];  Service: Thoracic;  Laterality: N/A;   Social History   Social History Narrative   Not on file   Immunization History  Administered Date(s) Administered   Influenza, Seasonal, Injecte, Preservative Fre 11/12/2017, 08/13/2018   Influenza-Unspecified 11/12/2017   Pneumococcal Conjugate-13 03/20/2019   Pneumococcal Polysaccharide-23 02/21/2017   Pneumococcal-Unspecified  02/21/2017   Td 12/19/2010     Objective: Vital Signs: BP 116/75 (BP Location: Left Arm, Patient Position: Sitting, Cuff Size: Normal)    Pulse 98    Ht _0  (1.651 m)    Wt 182 lb 3.2 oz (82.6 kg)    LMP  (LMP Unknown)    BMI 30.32 kg/m    Physical Exam Vitals signs and nursing note reviewed.  Constitutional:      Appearance: She is well-developed.  HENT:     Head: Normocephalic and atraumatic.  Eyes:     Conjunctiva/sclera: Conjunctivae normal.  Neck:     Musculoskeletal: Normal range of motion.  Cardiovascular:     Rate and Rhythm: Normal rate and regular rhythm.     Heart sounds: Normal heart sounds.  Pulmonary:     Effort: Pulmonary effort is normal.     Breath sounds: Normal breath sounds.     Comments: On 4 L of oxygen Abdominal:     General: Bowel sounds are normal.     Palpations: Abdomen is soft.  Lymphadenopathy:     Cervical: No cervical adenopathy.  Skin:    General: Skin is warm and dry.     Capillary Refill: Capillary refill takes less than 2 seconds.     Comments: Few bullous lesions were noted on her upper extremities.  Patient also had several subcutaneous lesions on her upper and lower extremities which raises concern of panniculitis.  Neurological:     Mental Status: She is alert and oriented to person, place, and time.  Psychiatric:        Behavior: Behavior normal.      Musculoskeletal Exam: C-spine thoracic and lumbar spine with good range of motion.  She has some discomfort range of motion of her left shoulder which she believes is due to a rotator cuff tendinopathy.  Elbow joints are in good range of motion.  Wrist joint MCPs with good range of motion.  She has partial amputation of her left index finger.  She had the right ring finger and the bandage due to active ulcer.  CDAI Exam: CDAI Score: -- Patient Global: --; Provider Global: -- Swollen: --; Tender: -- Joint Exam   No joint exam has been documented for this visit   There is  currently no information documented on the homunculus. Go to the Rheumatology activity and complete the homunculus joint exam.  Investigation: No additional findings.  Imaging: No results found.  Recent Labs: Lab Results  Component Value Date   WBC 8.9 12/23/2018   HGB 11.2 (L) 01/30/2019   PLT 256 12/23/2018   NA 139 01/30/2019   K 3.3 (L) 01/30/2019  CL 106 12/25/2018   CO2 27 12/25/2018   GLUCOSE 108 (H) 01/30/2019   BUN 15 12/25/2018   CREATININE 0.94 12/25/2018   BILITOT 0.1 (L) 11/19/2018   ALKPHOS 58 11/19/2018   AST 26 11/19/2018   ALT 32 11/19/2018   PROT 6.2 (L) 11/19/2018   ALBUMIN 2.8 (L) 11/19/2018   CALCIUM 8.7 (L) 12/25/2018   GFRAA >60 12/25/2018  November 17, 2018 MPO negative ANCA negative SCL 70- GBM antibody negative double-stranded DNA negative Ro negative La negative  Speciality Comments: No specialty comments available.  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  They all appeared as bullous lesions and turned into deeper secondary infections.  Some of them may have been treated with antibiotics.  She had to have amputation of her left index finger due to nonhealing infected ulcer.  I am uncertain about the etiology of these lesions.  No sclerodactyly was noted.  She had good circulation and good perfusion in her hands.  She has edema on her upper and lower extremities.  She would benefit from seeing a dermatologist.  She states she will contact her dad's dermatologist at [facility] and we will schedule appointment with them.  If she cannot schedule an appointment  then we can help expedite the appointment with dermatologist.  Subcutaneous lesions-she has subcutaneous fibrotic lesions on upper and lower extremities and her gluteal region which raises possibility of panniculitis.  Have referred her to dermatology for evaluation and biopsy.  Raynaud's syndrome without gangrene-patient gives history of rainouts phenomenon.  She had good capillary refill.  Chronic respiratory failure with hypoxia (HCC)-for the last few years.  Patient states she also has pulmonary fibrosis and eosinophilic asthma for which she has been seeing pulmonologist now.  She has multiple medical problems which are listed as follows:  Essential hypertension  PVC's (premature ventricular contractions)  History of gastroesophageal reflux (GERD)  Thyroid cancer ([place])  Pituitary adenoma ([place])  Adrenal insufficiency (HCC)  Nephrolithiasis  Oxygen dependent  Hx of Hodgkins lymphoma  History of ductal carcinoma in situ (DCIS) of breast  History of cervical cancer  History of breast cancer  Steroid-induced osteoporosis  Orders: Orders Placed This Encounter  Procedures   Ambulatory referral to Dermatology   No orders of the defined types were placed in this encounter.   Face-to-face time spent with patient was 120 minutes. Greater than 50% of time was spent in counseling and coordination of care.  Follow-Up Instructions: Return if symptoms worsen or fail to improve, for Rash.   Bo Merino, MD  Note - This record has been created using Editor, commissioning.  Chart creation errors have been sought, but may not always  have been located. Such creation errors do not reflect on  the standard of medical care.

## 2019-06-02 ENCOUNTER — Encounter: Payer: Self-pay | Admitting: Rheumatology

## 2019-06-02 ENCOUNTER — Other Ambulatory Visit: Payer: Self-pay

## 2019-06-02 ENCOUNTER — Ambulatory Visit (INDEPENDENT_AMBULATORY_CARE_PROVIDER_SITE_OTHER): Payer: BC Managed Care – PPO | Admitting: Rheumatology

## 2019-06-02 VITALS — BP 116/75 | HR 98 | Ht 65.0 in | Wt 182.2 lb

## 2019-06-02 DIAGNOSIS — I7300 Raynaud's syndrome without gangrene: Secondary | ICD-10-CM | POA: Diagnosis not present

## 2019-06-02 DIAGNOSIS — T380X5A Adverse effect of glucocorticoids and synthetic analogues, initial encounter: Secondary | ICD-10-CM

## 2019-06-02 DIAGNOSIS — M818 Other osteoporosis without current pathological fracture: Secondary | ICD-10-CM

## 2019-06-02 DIAGNOSIS — Z853 Personal history of malignant neoplasm of breast: Secondary | ICD-10-CM

## 2019-06-02 DIAGNOSIS — I493 Ventricular premature depolarization: Secondary | ICD-10-CM

## 2019-06-02 DIAGNOSIS — L139 Bullous disorder, unspecified: Secondary | ICD-10-CM

## 2019-06-02 DIAGNOSIS — E2740 Unspecified adrenocortical insufficiency: Secondary | ICD-10-CM

## 2019-06-02 DIAGNOSIS — N200 Calculus of kidney: Secondary | ICD-10-CM

## 2019-06-02 DIAGNOSIS — I10 Essential (primary) hypertension: Secondary | ICD-10-CM | POA: Diagnosis not present

## 2019-06-02 DIAGNOSIS — D352 Benign neoplasm of pituitary gland: Secondary | ICD-10-CM

## 2019-06-02 DIAGNOSIS — Z9981 Dependence on supplemental oxygen: Secondary | ICD-10-CM

## 2019-06-02 DIAGNOSIS — Z8719 Personal history of other diseases of the digestive system: Secondary | ICD-10-CM

## 2019-06-02 DIAGNOSIS — J9611 Chronic respiratory failure with hypoxia: Secondary | ICD-10-CM

## 2019-06-02 DIAGNOSIS — Z8571 Personal history of Hodgkin lymphoma: Secondary | ICD-10-CM

## 2019-06-02 DIAGNOSIS — C73 Malignant neoplasm of thyroid gland: Secondary | ICD-10-CM

## 2019-06-02 DIAGNOSIS — Z8541 Personal history of malignant neoplasm of cervix uteri: Secondary | ICD-10-CM

## 2019-06-02 DIAGNOSIS — Z86000 Personal history of in-situ neoplasm of breast: Secondary | ICD-10-CM

## 2019-06-03 ENCOUNTER — Ambulatory Visit: Payer: BC Managed Care – PPO

## 2019-06-03 ENCOUNTER — Telehealth: Payer: Self-pay | Admitting: Rheumatology

## 2019-06-03 ENCOUNTER — Other Ambulatory Visit (HOSPITAL_COMMUNITY)
Admission: RE | Admit: 2019-06-03 | Discharge: 2019-06-03 | Disposition: A | Discharge status: Home / Self Care | Payer: BC Managed Care – PPO | Source: Other Acute Inpatient Hospital | Attending: Rheumatology | Admitting: Rheumatology

## 2019-06-03 DIAGNOSIS — M79642 Pain in left hand: Secondary | ICD-10-CM | POA: Diagnosis not present

## 2019-06-03 DIAGNOSIS — I7300 Raynaud's syndrome without gangrene: Secondary | ICD-10-CM | POA: Insufficient documentation

## 2019-06-03 DIAGNOSIS — L03012 Cellulitis of left finger: Secondary | ICD-10-CM | POA: Diagnosis not present

## 2019-06-03 MED ORDER — HEPARIN SOD (PORK) LOCK FLUSH 100 UNIT/ML IV SOLN: 500.0000 [IU] | INTRAVENOUS | Status: DC | PRN

## 2019-06-03 NOTE — Telephone Encounter (Signed)
Demographics faxed to [facility].

## 2019-06-03 NOTE — Telephone Encounter (Signed)
Monica at [facility] left a voicemail stating they received the urgent referral for the patient, but didn't receive the demographics.  Brayton Layman is requesting the information be faxed so she can contact the patient for an appointment.  Fax [phone] Attn: Brayton Layman

## 2019-06-04 ENCOUNTER — Telehealth: Payer: Self-pay | Admitting: Rheumatology

## 2019-06-04 DIAGNOSIS — R51 Headache: Secondary | ICD-10-CM | POA: Diagnosis not present

## 2019-06-04 LAB — RHEUMATOID FACTOR: Rheumatoid fact SerPl-aCnc: <10 IU/mL (ref 0.0–13.9)

## 2019-06-04 LAB — C3 AND C4
C3 Complement: 188 mg/dL — ABNORMAL HIGH (ref 82–167)
Complement C4, Body Fluid: 20 mg/dL (ref 14–44)

## 2019-06-04 LAB — RNP ANTIBODIES: Ribonucleic Protein: <0.2 AI (ref 0.0–0.9)

## 2019-06-04 NOTE — Telephone Encounter (Signed)
1. Patient advised we appreciate her advising [place] about her dermatology appointment.   2. Patient advised that her weight in the chart is documented as 182.5 lbs.   Patient advised that we are still waiting on all of her labs to results and will call to schedule a sooner follow up appointment if available.

## 2019-06-04 NOTE — Telephone Encounter (Signed)
#  1.Patient calling to let you know she is scheduled for a dermatology appt. Tomorrow 06/05/2019 at 10:00am.  #2.Patient also wanted to call, and ask again for weight to be changed in chart. Cardiologist called her questioning 188lbs. Per patient, he still sees 188lbs, and she weighted 182.5lbs.#3.  Patient requesting a call back to discuss labs before follow up since there a lot of doctors waiting on these results. Please call to advise.

## 2019-06-05 LAB — MISC LABCORP TEST (SEND OUT): Labcorp test code: 520013

## 2019-06-05 LAB — CYCLIC CITRUL PEPTIDE ANTIBODY, IGG/IGA: CCP Antibodies IgG/IgA: 6 units (ref 0–19)

## 2019-06-12 ENCOUNTER — Telehealth: Payer: Self-pay | Admitting: *Deleted

## 2019-06-12 NOTE — Telephone Encounter (Signed)
error 

## 2019-06-12 NOTE — Telephone Encounter (Signed)
Pulmonary Telephone Encounter  I called Mrs. Deyoe via telephone. She is expressing frustration about her medical care. Since seeing her Rheumatologist, she reports digital ulcers and blistering involvement of her toes in addition to her fingers now. She was previously scheduled with a dermatology office here in [place] however had a poor interaction with the front desk staff regarding her appointment time. She has asked her PCP for referral to [facility] but has not received and appointment and is concerned that she will suffer more digital damage in the interim. She also has questions regarding Nucala about it's ability to suppress her immune system and affecting her healing.  Plan: I will send a message to her Rheumatologist for an alternative Dermatology office referral for biopsy. I will also contact Pulmonary Pharmacy team regarding postponing her Nucala for one month as her current skin lesions are being worked up by Rheumatology and Dermatology.  Rodman Pickle, M.D. [facility] Pulmonary/Critical Care Medicine 06/12/2019 12:32 PM

## 2019-06-12 NOTE — Telephone Encounter (Signed)
Dr. Loanne Drilling the pharmacy team number is [phone] or [phone]

## 2019-06-12 NOTE — Telephone Encounter (Signed)
-----  Message from Susan Merino, MD sent at 06/12/2019  2:08 PM EDT ----- Regarding: RE: Regarding Dermatology referral  [name],  Thank you for the update. We will contact the patient.  [name]  ----- Message ----- From: Margaretha Seeds, MD Sent: 06/12/2019  12:02 PM EDT To: Susan Merino, MD, Susan Susan Pickle, MD Subject: Regarding Dermatology referral                  [name],  We have a shared patient, [name], that was recently seen in your clinic for upper extremity swelling and digital ulcerations. She mentioned that you had referred her to Dermatology [facility] dermatology however she had a poor interaction with the front desk staff. She has tried to get referred to [facility] but is having difficulty. Are you able to refer her to a different Dermatology group?  I am still new to the [place] area and would defer to you for preferences on subspecialty groups skillset and for their communication skills with patients.  Of note, she is noticing lesions on her toes as well that are similar to her fingers. Per telephone report only. No available pictures.  Thanks,  [name] [facility]

## 2019-06-12 NOTE — Telephone Encounter (Signed)
Spoke with patient and she states she has spoke with [facility] dermatology and had had an appointment scheduled. Patient states the morning of the appointment she had a migraine and was vomiting due to the migraine. Patient states she contacted their office to reschedule the appointment. Patient states she she has been working Dr. Amedeo Plenty to save her fingers and has been having some new issues with that. Patient states she has went to see Dr. Amedeo Plenty often. Patient states she had a second appointment with [facility]. Patient states she was unable to make that appointment either. Patient states she reached out to them via their patient portal to let them know she was not going to be there as the day before was Labor day and she could not contact them. Patient states she was advised they are going to charge her a no show fee. Patient was not happy with that and states " the emails got ugly." Patient states she is still willing to see [facility] dermatology. Will call to see if they will reschedule her.

## 2019-06-16 ENCOUNTER — Ambulatory Visit: Payer: BC Managed Care – PPO | Admitting: Cardiovascular Disease

## 2019-06-16 ENCOUNTER — Telehealth: Payer: Self-pay | Admitting: *Deleted

## 2019-06-16 DIAGNOSIS — L139 Bullous disorder, unspecified: Secondary | ICD-10-CM

## 2019-06-16 NOTE — Telephone Encounter (Signed)
-----  Message from Bo Merino, MD sent at 06/15/2019  2:02 PM EDT ----- Regarding: RE: Regarding Dermatology referral Please reschedule appointment with Dr. Denna Haggard. SD ----- Message ----- From: Margaretha Seeds, MD Sent: 06/12/2019  12:02 PM EDT To: Bo Merino, MD, Chi Rodman Pickle, MD Subject: Regarding Dermatology referral                  Dr. Estanislado Pandy,  We have a shared patient, Ms. Quijas, that was recently seen in your clinic for upper extremity swelling and digital ulcerations. She mentioned that you had referred her to Dermatology [facility] dermatology however she had a poor interaction with the front desk staff. She has tried to get referred to [facility] but is having difficulty. Are you able to refer her to a different Dermatology group?  I am still new to the [place] area and would defer to you for preferences on subspecialty groups skillset and for their communication skills with patients.  Of note, she is noticing lesions on her toes as well that are similar to her fingers. Per telephone report only. No available pictures.  Thanks,  Rodman Pickle [place]

## 2019-06-17 ENCOUNTER — Other Ambulatory Visit: Payer: Self-pay | Admitting: Dermatology

## 2019-06-17 DIAGNOSIS — R238 Other skin changes: Secondary | ICD-10-CM | POA: Diagnosis not present

## 2019-06-17 DIAGNOSIS — L97909 Non-pressure chronic ulcer of unspecified part of unspecified lower leg with unspecified severity: Secondary | ICD-10-CM | POA: Diagnosis not present

## 2019-06-17 DIAGNOSIS — D229 Melanocytic nevi, unspecified: Secondary | ICD-10-CM | POA: Diagnosis not present

## 2019-06-17 DIAGNOSIS — D485 Neoplasm of uncertain behavior of skin: Secondary | ICD-10-CM | POA: Diagnosis not present

## 2019-06-17 DIAGNOSIS — L309 Dermatitis, unspecified: Secondary | ICD-10-CM | POA: Diagnosis not present

## 2019-06-17 DIAGNOSIS — L308 Other specified dermatitis: Secondary | ICD-10-CM | POA: Diagnosis not present

## 2019-06-18 ENCOUNTER — Other Ambulatory Visit: Payer: Self-pay

## 2019-06-18 ENCOUNTER — Telehealth: Payer: Medicare Other | Admitting: Cardiovascular Disease

## 2019-06-18 LAB — MISC LABCORP TEST (SEND OUT): Labcorp test code: 520175

## 2019-06-19 DIAGNOSIS — F900 Attention-deficit hyperactivity disorder, predominantly inattentive type: Secondary | ICD-10-CM | POA: Diagnosis not present

## 2019-06-19 DIAGNOSIS — Z23 Encounter for immunization: Secondary | ICD-10-CM | POA: Diagnosis not present

## 2019-06-19 DIAGNOSIS — Z95828 Presence of other vascular implants and grafts: Secondary | ICD-10-CM | POA: Diagnosis not present

## 2019-06-19 NOTE — Progress Notes (Deleted)
Office Visit Note  Patient: [name]             Date of Birth: Oct 04, 1970           MRN: [id number]             PCP: Su Grand, MD Referring: Su Grand, MD Visit Date: 06/30/2019 Occupation: _0 @  #############################################################  Subjective:  No chief complaint on file.   History of Present Illness: [name] is a 48 y.o. female ***   Activities of Daily Living:  Patient reports morning stiffness for *** {minute/hour:19697}.   Patient {ACTIONS;DENIES/REPORTS:21021675::"Denies"} nocturnal pain.  Difficulty dressing/grooming: {ACTIONS;DENIES/REPORTS:21021675::"Denies"} Difficulty climbing stairs: {ACTIONS;DENIES/REPORTS:21021675::"Denies"} Difficulty getting out of chair: {ACTIONS;DENIES/REPORTS:21021675::"Denies"} Difficulty using hands for taps, buttons, cutlery, and/or writing: {ACTIONS;DENIES/REPORTS:21021675::"Denies"}  No Rheumatology ROS completed.   PMFS History:  Patient Active Problem List   Diagnosis Date Noted  . Cough variant asthma 04/13/2019  . Injury of left index finger 12/24/2018  . Pituitary adenoma ([place]) 12/23/2018  . History of cervical cancer 12/23/2018  . Chronic respiratory failure with hypoxia ([place]) 12/23/2018  . SOB (shortness of breath)   . Hemoptysis 11/11/2018  . Nephrolithiasis 11/07/2018  . Oxygen dependent 11/07/2018  . Migraine 11/07/2018  . PVC's (premature ventricular contractions) 10/27/2018  . Dizziness 11/16/2014  . Hyperlipidemia 03/09/2014  . Hx of Hodgkins lymphoma   . History of ductal carcinoma in situ (DCIS) of breast   . Thyroid cancer ([place])   . Adrenal insufficiency ([place])   . Hypertension   . Raynaud phenomenon   . GERD (gastroesophageal reflux disease)     Past Medical History:  Diagnosis Date  . Addison's disease ([place])   . Adrenal insufficiency ([place])   . Anemia   . Anxiety   . Aortic stenosis   . Aortic stenosis   . Appendicitis 12/19/09  . Appendicitis    . Breast cancer ([place])    STATUS POST BILATERAL MASTECTOMY. STATUS POST RECONSTRUCTION. SHE HAD SILICONE BREAST IMPLANTS AND THE LEFT IMPLANT IS LEAKING SLIGHTLY  . Cellulitis of right middle finger 11/07/2018  . Cervical cancer ([place]) 12/23/2018  . Chest pain   . CHF with right heart failure ([place]) 04/17/2017  . Chronic respiratory failure with hypoxia ([place]) 12/23/2018  . Cough variant asthma 04/13/2019  . Depression   . GERD (gastroesophageal reflux disease)   . Headache    migraines  . Heart murmur   . History of kidney stones   . Hodgkin lymphoma (HCC)    STATUS POST MANTLE RADIATION  . Hodgkin's lymphoma ([place])    1987  . Hypertension   . Hypoxia   . Necrotizing fasciitis ([place]) 12/23/2018  . Non-ischemic cardiomyopathy ([place])   . Osteoporosis   . Palpitations   . Pituitary adenoma ([place]) 12/23/2018  . Pneumonia   . PONV (postoperative nausea and vomiting)   . Pre-diabetes    per pt; no meds  . Pulmonary hypertension ([place]) 12/23/2018  . Raynaud phenomenon   . Right heart failure ([place]) 04/17/2017  . Supplemental oxygen dependent    3 liters  . SVT (supraventricular tachycardia) ([place])   . Tachycardia   . Thyroid cancer ([place])    STATUS POST SURGICAL REMOVAL-CURRENT ON THYROID REPLACEMENT    Family History  Family history unknown: Yes   Past Surgical History:  Procedure Laterality Date  . ABDOMINAL HYSTERECTOMY    . AMPUTATION Left 01/30/2019   Procedure: Left Index finger amputation with flap reconstruction and repair reconstruction;  Surgeon: Roseanne Kaufman,  MD;  Location: [facility];  Service: Orthopedics;  Laterality: Left;  . APPENDECTOMY    . CARDIAC CATHETERIZATION  05/18/09   NORMAL CATH  . I&D EXTREMITY Left 12/23/2018   Procedure: IRRIGATION AND DEBRIDEMENT HAND / INDEX FINGER;  Surgeon: Roseanne Kaufman, MD;  Location: [facility];  Service: Orthopedics;  Laterality: Left;  Marland Kitchen MASTECTOMY    . PITUITARY SURGERY    . RIGHT/LEFT HEART CATH AND CORONARY ANGIOGRAPHY N/A 04/02/2018    Procedure: RIGHT/LEFT HEART CATH AND CORONARY ANGIOGRAPHY;  Surgeon: Burnell Blanks, MD;  Location: [facility];  Service: Cardiovascular;  Laterality: N/A;  . TOTAL THYROIDECTOMY    . VIDEO BRONCHOSCOPY Bilateral 11/14/2018   Procedure: VIDEO BRONCHOSCOPY WITHOUT FLUORO;  Surgeon: Margaretha Seeds, MD;  Location: [facility];  Service: Cardiopulmonary;  Laterality: Bilateral;  . VIDEO BRONCHOSCOPY WITH ENDOBRONCHIAL ULTRASOUND N/A 11/19/2018   Procedure: VIDEO BRONCHOSCOPY WITH ENDOBRONCHIAL ULTRASOUND;  Surgeon: Margaretha Seeds, MD;  Location: [facility];  Service: Thoracic;  Laterality: N/A;   Social History   Social History Narrative  . Not on file   Immunization History  Administered Date(s) Administered  . Influenza, Seasonal, Injecte, Preservative Fre 11/12/2017, 08/13/2018  . Influenza-Unspecified 11/12/2017  . Pneumococcal Conjugate-13 03/20/2019  . Pneumococcal Polysaccharide-23 02/21/2017  . Pneumococcal-Unspecified 02/21/2017  . Td 12/19/2010  . Varicella 04/02/2007     Objective: Vital Signs: LMP  (LMP Unknown)    Physical Exam   Musculoskeletal Exam: ***  CDAI Exam: CDAI Score: - Patient Global: -; Provider Global: - Swollen: -; Tender: - Joint Exam   No joint exam has been documented for this visit   There is currently no information documented on the homunculus. Go to the Rheumatology activity and complete the homunculus joint exam.  Investigation: No additional findings.  Imaging: No results found.  Recent Labs: Lab Results  Component Value Date   WBC 8.9 12/23/2018   HGB 11.2 (L) 01/30/2019   PLT 256 12/23/2018   NA 139 01/30/2019   K 3.3 (L) 01/30/2019   CL 106 12/25/2018   CO2 27 12/25/2018   GLUCOSE 108 (H) 01/30/2019   BUN 15 12/25/2018   CREATININE 0.94 12/25/2018   BILITOT 0.1 (L) 11/19/2018   ALKPHOS 58 11/19/2018   AST 26 11/19/2018   ALT 32 11/19/2018   PROT 6.2 (L) 11/19/2018   ALBUMIN 2.8 (L) 11/19/2018    CALCIUM 8.7 (L) 12/25/2018   GFRAA >60 12/25/2018  November 17, 2018 MPO negative ANCA negative SCL 70- GBM antibody negative double-stranded DNA negative Ro negative La negative  Speciality Comments: No specialty comments available.  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   Bullous dermatitis - Needs dermatology evaluation  Raynaud's syndrome without gangrene  Chronic respiratory failure with hypoxia (HCC) - History of pulmonary fibrosis and eosinophilic asthma per patient.  Followed by pulmonary.  Oxygen dependent  Steroid-induced osteoporosis  Adrenal insufficiency (HCC)  Essential hypertension  PVC's (premature ventricular contractions)  History of gastroesophageal reflux (GERD)  Thyroid cancer ([place])  Pituitary adenoma (HCC)  Hx of Hodgkins lymphoma  History of ductal carcinoma in situ (DCIS) of breast  History of cervical cancer  Nephrolithiasis  Orders: No orders of the defined types were placed in this encounter.  No orders of the defined types were placed in this encounter.   Face-to-face time spent with patient was *** minutes. Greater than 50% of time was spent in counseling and coordination of care.  Follow-Up Instructions: No follow-ups on file.   Bo Merino, MD  Note - This record has been created using Editor, commissioning.  Chart creation errors have been sought, but may not always  have been located. Such creation errors do not reflect on  the standard of medical care.

## 2019-06-25 ENCOUNTER — Telehealth: Payer: Self-pay | Admitting: Rheumatology

## 2019-06-25 NOTE — Telephone Encounter (Signed)
Received below email from patient.  Advised patient that Dr. Loanne Drilling is not in the office today so I am not sure about availability to give her a call.  Dr. Loanne Drilling please advise. Thank you!  "Hey there,  If there is any way Dr. Loanne Drilling could please give me a quick call some time today that would be great. I went to [facility] and after a biopsy, finally got a diagnosis on my skin and wanted to talk to her about it.  Thank you so much. Hope you are having a good day!  [name] [phone]"

## 2019-06-25 NOTE — Telephone Encounter (Signed)
I received a note from Dr. Denna Haggard today.  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  He stated that the studies were negative for autoimmune origin.  He believes that Lasix is the most likely cause of drug-induced photo allergy.  He advised holding Lasix and see if her symptoms will improve.  I am still uncertain about the cause of subcutaneous fibrotic lesions.  Please ask if patient has discussed that with Dr. Denna Haggard.  If not then she may need a follow-up visit with him.  She should discuss stopping Lasix with her PCP.  She will not need a follow-up visit with me.

## 2019-06-26 NOTE — Telephone Encounter (Signed)
Patient advised Dr. Estanislado Pandy received a note from Dr. Denna Haggard today.  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  He stated that the studies were negative for autoimmune origin.  He believes that Lasix is the most likely cause of drug-induced photo allergy.  He advised holding Lasix and see if her symptoms will improve.  I am still uncertain about the cause of subcutaneous fibrotic lesions.  Please ask if patient has discussed that with Dr. Denna Haggard.  If not then she may need a follow-up visit with him.  She should discuss stopping Lasix with her PCP.  She will not need a follow-up visit with Dr. Estanislado Pandy.   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

## 2019-06-27 ENCOUNTER — Other Ambulatory Visit: Payer: Self-pay

## 2019-06-27 ENCOUNTER — Emergency Department (HOSPITAL_BASED_OUTPATIENT_CLINIC_OR_DEPARTMENT_OTHER): Payer: BC Managed Care – PPO

## 2019-06-27 ENCOUNTER — Emergency Department (HOSPITAL_COMMUNITY)
Admission: EM | Admit: 2019-06-27 | Discharge: 2019-06-27 | Disposition: A | Discharge status: Home / Self Care | Payer: BC Managed Care – PPO | Attending: Emergency Medicine | Admitting: Emergency Medicine

## 2019-06-27 DIAGNOSIS — Z853 Personal history of malignant neoplasm of breast: Secondary | ICD-10-CM | POA: Diagnosis not present

## 2019-06-27 DIAGNOSIS — Z85850 Personal history of malignant neoplasm of thyroid: Secondary | ICD-10-CM | POA: Diagnosis not present

## 2019-06-27 DIAGNOSIS — I509 Heart failure, unspecified: Secondary | ICD-10-CM | POA: Diagnosis not present

## 2019-06-27 DIAGNOSIS — Z86718 Personal history of other venous thrombosis and embolism: Secondary | ICD-10-CM | POA: Insufficient documentation

## 2019-06-27 DIAGNOSIS — I82811 Embolism and thrombosis of superficial veins of right lower extremities: Secondary | ICD-10-CM | POA: Diagnosis not present

## 2019-06-27 DIAGNOSIS — Z79899 Other long term (current) drug therapy: Secondary | ICD-10-CM | POA: Insufficient documentation

## 2019-06-27 DIAGNOSIS — R52 Pain, unspecified: Secondary | ICD-10-CM | POA: Diagnosis not present

## 2019-06-27 DIAGNOSIS — Z8541 Personal history of malignant neoplasm of cervix uteri: Secondary | ICD-10-CM | POA: Insufficient documentation

## 2019-06-27 DIAGNOSIS — I110 Hypertensive heart disease with heart failure: Secondary | ICD-10-CM | POA: Insufficient documentation

## 2019-06-27 DIAGNOSIS — M79651 Pain in right thigh: Secondary | ICD-10-CM | POA: Diagnosis not present

## 2019-06-27 DIAGNOSIS — I82611 Acute embolism and thrombosis of superficial veins of right upper extremity: Secondary | ICD-10-CM | POA: Diagnosis not present

## 2019-06-27 MED ORDER — HEPARIN SOD (PORK) LOCK FLUSH 100 UNIT/ML IV SOLN
500.0000 [IU] | Freq: Once | INTRAVENOUS | Status: AC
  Administered 2019-06-27: 500 [IU]
  Filled 2019-06-27: qty 5

## 2019-06-27 MED ORDER — OXYCODONE-ACETAMINOPHEN 5-325 MG PO TABS
1.0000 | ORAL_TABLET | Freq: Once | ORAL | Status: DC
  Filled 2019-06-27: qty 1

## 2019-06-27 MED ORDER — DIPHENHYDRAMINE HCL 50 MG/ML IJ SOLN
25.0000 mg | Freq: Once | INTRAMUSCULAR | Status: AC
  Administered 2019-06-27: 25 mg via INTRAVENOUS
  Filled 2019-06-27: qty 1

## 2019-06-27 MED ORDER — RIVAROXABAN (XARELTO) VTE STARTER PACK (15 & 20 MG): ORAL_TABLET | ORAL | 0 refills | Status: DC

## 2019-06-27 MED ORDER — FENTANYL CITRATE (PF) 100 MCG/2ML IJ SOLN
50.0000 ug | Freq: Once | INTRAMUSCULAR | Status: AC
  Administered 2019-06-27: 50 ug via INTRAVENOUS
  Filled 2019-06-27: qty 2

## 2019-06-27 MED ORDER — RIVAROXABAN 15 MG PO TABS
15.0000 mg | ORAL_TABLET | Freq: Once | ORAL | Status: AC
  Administered 2019-06-27: 15 mg via ORAL
  Filled 2019-06-27: qty 1

## 2019-06-27 NOTE — Progress Notes (Signed)
VASCULAR LAB PRELIMINARY  PRELIMINARY  PRELIMINARY  PRELIMINARY  Right lower extremity venous duplex completed.    Preliminary report:  See CV proc for preliminary results.   Gave Dr. Tomi Bamberger results.   Blanca Carreon, RVT 06/27/2019, 8:18 PM

## 2019-06-27 NOTE — ED Triage Notes (Signed)
Pt arrived with husband. Pt states" I am pretty sure I have a blood clot in my upper right thigh". Pt endorses 7/10 thigh pain since yesterday.

## 2019-06-27 NOTE — ED Provider Notes (Signed)
[facility] EMERGENCY DEPARTMENT Provider Note   CSN: [id number] Arrival date & time: 06/27/19  1615     History   Chief Complaint Chief Complaint  Patient presents with  . Hip Pain    HPI [name] is a 48 y.o. female.     HPI Patient presents to the ED for evaluation of right thigh pain.  Patient has a very complex medical history as indicated below.  Patient also has a history of having venous thrombosis.  Patient states she started having pain in her right thigh yesterday.  The area is tender to palpation.  Pain is increased in severity.  Patient was concerned that she had DVT.  She is not having any trouble with any chest pain or shortness of breath currently.  She denies any fevers or chills. Past Medical History:  Diagnosis Date  . Addison's disease ([place])   . Adrenal insufficiency ([place])   . Anemia   . Anxiety   . Aortic stenosis   . Aortic stenosis   . Appendicitis 12/19/09  . Appendicitis   . Breast cancer ([place])    STATUS POST BILATERAL MASTECTOMY. STATUS POST RECONSTRUCTION. SHE HAD SILICONE BREAST IMPLANTS AND THE LEFT IMPLANT IS LEAKING SLIGHTLY  . Cellulitis of right middle finger 11/07/2018  . Cervical cancer ([place]) 12/23/2018  . Chest pain   . 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 Chronic respiratory failure with hypoxia ([place]) 12/23/2018  . Cough variant asthma 04/13/2019  . Depression   . GERD (gastroesophageal reflux disease)   . Headache    migraines  . Heart murmur   . History of kidney stones   . Hodgkin lymphoma (HCC)    STATUS POST MANTLE RADIATION  . Hodgkin's lymphoma ([place])    1987  . Hypertension   . Hypoxia   . Necrotizing fasciitis ([place]) 12/23/2018  . Non-ischemic cardiomyopathy ([place])   . Osteoporosis   . Palpitations   . Pituitary adenoma ([place]) 12/23/2018  . Pneumonia   . PONV (postoperative nausea and vomiting)   . Pre-diabetes    per pt; no meds  . Pulmonary hypertension ([place]) 12/23/2018  . Raynaud  phenomenon   . Right heart failure ([place]) 04/17/2017  . Supplemental oxygen dependent    3 liters  . SVT (supraventricular tachycardia) ([place])   . Tachycardia   . Thyroid cancer ([place])    STATUS POST SURGICAL REMOVAL-CURRENT ON THYROID REPLACEMENT    Patient Active Problem List   Diagnosis Date Noted  . Cough variant asthma 04/13/2019  . Injury of left index finger 12/24/2018  . Pituitary adenoma ([place]) 12/23/2018  . History of cervical cancer 12/23/2018  . Chronic respiratory failure with hypoxia ([place]) 12/23/2018  . SOB (shortness of breath)   . Hemoptysis 11/11/2018  . Nephrolithiasis 11/07/2018  . Oxygen dependent 11/07/2018  . Migraine 11/07/2018  . PVC's (premature ventricular contractions) 10/27/2018  . Dizziness 11/16/2014  . Hyperlipidemia 03/09/2014  . Hx of Hodgkins lymphoma   . History of ductal carcinoma in situ (DCIS) of breast   . Thyroid cancer ([place])   . Adrenal insufficiency ([place])   . Hypertension   . Raynaud phenomenon   . GERD (gastroesophageal reflux disease)     Past Surgical History:  Procedure Laterality Date  . ABDOMINAL HYSTERECTOMY    . AMPUTATION Left 01/30/2019   Procedure: Left Index finger amputation with flap reconstruction and repair reconstruction;  Surgeon: Roseanne Kaufman, MD;  Location: [facility];  Service: Orthopedics;  Laterality: Left;  . APPENDECTOMY    . CARDIAC CATHETERIZATION  05/18/09   NORMAL CATH  . I&D EXTREMITY Left 12/23/2018   Procedure: IRRIGATION AND DEBRIDEMENT HAND / INDEX FINGER;  Surgeon: Roseanne Kaufman, MD;  Location: [facility];  Service: Orthopedics;  Laterality: Left;  Marland Kitchen MASTECTOMY    . PITUITARY SURGERY    . RIGHT/LEFT HEART CATH AND CORONARY ANGIOGRAPHY N/A 04/02/2018   Procedure: RIGHT/LEFT HEART CATH AND CORONARY ANGIOGRAPHY;  Surgeon: Burnell Blanks, MD;  Location: [facility];  Service: Cardiovascular;  Laterality: N/A;  . TOTAL THYROIDECTOMY    . VIDEO BRONCHOSCOPY Bilateral 11/14/2018   Procedure: VIDEO  BRONCHOSCOPY WITHOUT FLUORO;  Surgeon: Margaretha Seeds, MD;  Location: [facility];  Service: Cardiopulmonary;  Laterality: Bilateral;  . VIDEO BRONCHOSCOPY WITH ENDOBRONCHIAL ULTRASOUND N/A 11/19/2018   Procedure: VIDEO BRONCHOSCOPY WITH ENDOBRONCHIAL ULTRASOUND;  Surgeon: Margaretha Seeds, MD;  Location: [facility];  Service: Thoracic;  Laterality: N/A;     OB History   No obstetric history on file.      Home Medications    Prior to Admission medications   Medication Sig Start Date End Date Taking? Authorizing Provider  albuterol (2.5 MG/3ML) 0.083% NEBU 3 mL, albuterol (5 MG/ML) 0.5% NEBU 0.5 mL Inhale into the lungs as needed.    [provider]  albuterol (VENTOLIN HFA) 108 (90 Base) MCG/ACT inhaler Inhale 2 puffs into the lungs every 6 (six) hours as needed for wheezing or shortness of breath.    [provider]  Alum & Mag Hydroxide-Simeth (GI COCKTAIL) SUSP suspension Take 30 mLs daily as needed by mouth for indigestion (BLEEDING ULCERS). Shake well.    [provider]  beclomethasone (QVAR) 80 MCG/ACT inhaler Inhale 1 puff into the lungs 2 (two) times daily.     [provider]  benzonatate (TESSALON) 200 MG capsule Take 1 capsule (200 mg total) by mouth 3 (three) times daily as needed for cough. 04/15/19   Martyn Ehrich, NP  clonazePAM (KLONOPIN) 1 MG tablet Take 1 mg by mouth 2 (two) times daily.     [provider]  dexlansoprazole (DEXILANT) 60 MG capsule Take 60 mg by mouth daily.      [provider]  Diclofenac Potassium (CAMBIA) 50 MG PACK Take 1 packet by mouth 3 (three) times daily as needed (migraines).  10/10/15   [provider]  diphenhydrAMINE (BENADRYL) 50 MG/ML injection Inject 1 mL (50 mg total) into the vein every 6 (six) hours as needed (nausea). 12/25/18   Rai, Ripudeep K, MD  EPINEPHrine 0.3 mg/0.3 mL IJ SOAJ injection Inject 0.3 mLs (0.3 mg total) into the muscle as needed for anaphylaxis. 05/11/19    Margaretha Seeds, MD  escitalopram (LEXAPRO) 20 MG tablet Take 20 mg by mouth daily.  04/24/16   [provider]  estradiol (ESTRACE) 2 MG tablet Take 2 mg by mouth daily. 02/01/16   [provider]  Ferrous Sulfate Dried (SLOW RELEASE IRON) 45 MG TBCR Take 1 tablet at bedtime by mouth.    [provider]  fluticasone furoate-vilanterol (BREO ELLIPTA) 200-25 MCG/INH AEPB Inhale 1 puff into the lungs daily.    [provider]  furosemide (LASIX) 20 MG tablet Take 20 mg by mouth daily.     [provider]  Galcanezumab-gnlm 120 MG/ML SOAJ Inject into the skin. 03/31/19   [provider]  hydrocortisone (CORTEF) 10 MG tablet Take 5-10 mg by mouth See admin instructions. Take  10 mg in the am and 5 mg in the pm    [provider]  levothyroxine (SYNTHROID, LEVOTHROID) 112 MCG tablet Take 112 mcg by mouth daily before breakfast.    [provider]  Mepolizumab (NUCALA) 100 MG SOLR Inject 100 mg into the skin every 28 (twenty-eight) days. Patient not taking: Reported on 06/02/2019 05/13/19   Margaretha Seeds, MD  metoprolol succinate (TOPROL-XL) 25 MG 24 hr tablet Take 25 mg by mouth daily.     [provider]  Rivaroxaban 15 & 20 MG TBPK Follow package directions: Take one 37m tablet by mouth twice a day. On day 22, switch to one 259mtablet once a day. Take with food. 06/27/19   KnDorie RankMD  rosuvastatin (CRESTOR) 10 MG tablet Take 10 mg by mouth daily.  02/28/18   [provider]  sucralfate (CARAFATE) 1 g tablet Take 1 g as needed by mouth (FOR ULCERS).    [provider]  topiramate (TOPAMAX) 100 MG tablet Take 150 mg by mouth at bedtime.     [provider]  traMADol (ULTRAM) 50 MG tablet Take 50 mg by mouth daily as needed (migraine).  06/28/16   [provider]  zolpidem (AMBIEN CR) 12.5 MG CR tablet Take 12.5 mg by mouth at bedtime.      [provider]    Family History  Family History  Family history unknown: Yes    Social History Social History   Tobacco Use  . Smoking status: Never Smoker  . Smokeless tobacco: Never Used  Substance Use Topics  . Alcohol use: Yes    Comment: social   . Drug use: No     Allergies   Na ferric gluc cplx in sucrose, Cymbalta [duloxetine hcl], Mushroom extract complex, Succinylcholine, Ativan [lorazepam], Buprenorphine hcl, Compazine, Metoclopramide, Morphine and related, Ondansetron, Promethazine hcl, and Tegaderm ag mesh [silver]   Review of Systems Review of Systems  All other systems reviewed and are negative.    Physical Exam Updated Vital Signs BP (!) 138/91   Pulse (!) 101   Temp 98 F (36.7 C) (Oral)   Resp 14   LMP  (LMP Unknown)   SpO2 99%   Physical Exam Vitals signs and nursing note reviewed.  Constitutional:      Appearance: She is well-developed. She is not toxic-appearing or diaphoretic.  HENT:     Head: Normocephalic and atraumatic.     Right Ear: External ear normal.     Left Ear: External ear normal.  Eyes:     General: No scleral icterus.       Right eye: No discharge.        Left eye: No discharge.     Conjunctiva/sclera: Conjunctivae normal.  Neck:     Musculoskeletal: Neck supple.     Trachea: No tracheal deviation.  Cardiovascular:     Rate and Rhythm: Normal rate and regular rhythm.  Pulmonary:     Effort: Pulmonary effort is normal. No respiratory distress.     Breath sounds: Normal breath sounds. No stridor. No wheezing or rales.  Abdominal:     General: Bowel sounds are normal. There is no distension.     Palpations: Abdomen is soft.     Tenderness: There is no abdominal tenderness. There is no guarding or rebound.  Musculoskeletal:        General: Tenderness present.     Comments: Tenderness palpation medial right thigh, no induration, no inguinal mass, no  calf tenderness or swelling  Skin:    General: Skin is warm and dry.     Findings: No rash.   Neurological:     Mental Status: She is alert.     Cranial Nerves: No cranial nerve deficit (no facial droop, extraocular movements intact, no slurred speech).     Sensory: No sensory deficit.     Motor: No abnormal muscle tone or seizure activity.     Coordination: Coordination normal.      ED Treatments / Results  Labs (all labs ordered are listed, but only abnormal results are displayed) Labs Reviewed - No data to display  EKG None  Radiology No results found.  Procedures Procedures (including critical care time)  Medications Ordered in ED Medications  diphenhydrAMINE (BENADRYL) injection 25 mg (has no administration in time range)  Rivaroxaban (XARELTO) tablet 15 mg (has no administration in time range)  fentaNYL (SUBLIMAZE) injection 50 mcg (has no administration in time range)     Initial Impression / Assessment and Plan / ED Course  I have reviewed the triage vital signs and the nursing notes.  Pertinent labs & imaging results that were available during my care of the patient were reviewed by me and considered in my medical decision making (see chart for details).  Clinical Course as of Jun 26 1905  Sat Jun 27, 2019  1848 Pt states she can take hydrocodone or oxycodone as well as hydromorphone   [JK]  1849 Pt requests her benadryl IV that she takes daily for nausea   [JK]  1852 Preliminary DVT study shows a superficial venous thrombosis in her upper thigh.   [JK]  [id number] Patient is requesting a dose of fentanyl for her pain.  Patient states she usually gets 50 mcg.   [JK]    Clinical Course User Index [JK] Dorie Rank, MD  Prior labs reviewed.  Patient had a CBC on August 21 at [facility].  Hemoglobin was 12.5 hematocrit was 39.5, white blood cell count was 8.3 and platelets were 292.  Basic metabolic panel in June of this year showed a creatinine of 1.1.  I do not think we have to have repeat blood testing at this point then considering her recent laboratory tests.    Patient states she has had superficial thrombosis in the past that her doctors have treated her with Lovenox.  Typically superficial venous thrombosis would not require anticoagulation but considering the patient's history and the fact that she states she has been treated with anticoagulants before for this same condition I will start her on Xarelto and have her follow-up with her hematologist/ oncologist to determine whether or not to continue this treatment regimen.  Final Clinical Impressions(s) / ED Diagnoses   Final diagnoses:  Acute superficial venous thrombosis of lower extremity, right    ED Discharge Orders         Ordered    Rivaroxaban 15 & 20 MG TBPK     06/27/19 1905           Dorie Rank, MD 06/27/19 1906

## 2019-06-27 NOTE — ED Notes (Signed)
Pt verbalizes understanding of admission, discharge instructions, follow-up care, and prescriptions. Opportunity for questions and answers provided.

## 2019-06-27 NOTE — Discharge Instructions (Addendum)
Stop taking your diclofenac while taking the Xarelto.  Follow-up with your hematologist to discuss whether or not to continue the Xarelto medication.  Information on my medicine - XARELTO (rivaroxaban)  This medication education was reviewed with me or my healthcare representative as part of my discharge preparation.  WHY WAS XARELTO PRESCRIBED FOR YOU? Xarelto was prescribed to treat blood clots that may have been found in the veins of your legs (deep vein thrombosis) or in your lungs (pulmonary embolism) and to reduce the risk of them occurring again.  What do you need to know about Xarelto? The starting dose is one 15 mg tablet taken TWICE daily with food for the FIRST 21 DAYS then on (enter date)  07/18/2019  the dose is changed to one 20 mg tablet taken ONCE A DAY with your evening meal.  DO NOT stop taking Xarelto without talking to the health care provider who prescribed the medication.  Refill your prescription for 20 mg tablets before you run out.  After discharge, you should have regular check-up appointments with your healthcare provider that is prescribing your Xarelto.  In the future your dose may need to be changed if your kidney function changes by a significant amount.  What do you do if you miss a dose? If you are taking Xarelto TWICE DAILY and you miss a dose, take it as soon as you remember. You may take two 15 mg tablets (total 30 mg) at the same time then resume your regularly scheduled 15 mg twice daily the next day.  If you are taking Xarelto ONCE DAILY and you miss a dose, take it as soon as you remember on the same day then continue your regularly scheduled once daily regimen the next day. Do not take two doses of Xarelto at the same time.   Important Safety Information Xarelto is a blood thinner medicine that can cause bleeding. You should call your healthcare provider right away if you experience any of the following: ? Bleeding from an injury or your nose  that does not stop. ? Unusual colored urine (red or dark brown) or unusual colored stools (red or black). ? Unusual bruising for unknown reasons. ? A serious fall or if you hit your head (even if there is no bleeding).  Some medicines may interact with Xarelto and might increase your risk of bleeding while on Xarelto. To help avoid this, consult your healthcare provider or pharmacist prior to using any new prescription or non-prescription medications, including herbals, vitamins, non-steroidal anti-inflammatory drugs (NSAIDs) and supplements.  This website has more information on Xarelto: [email or web address]

## 2019-06-27 NOTE — ED Notes (Signed)
Pt husband at bedside. Made aware of visitor policy

## 2019-06-28 ENCOUNTER — Encounter (HOSPITAL_COMMUNITY): Payer: Self-pay | Admitting: Emergency Medicine

## 2019-06-28 ENCOUNTER — Other Ambulatory Visit: Payer: Self-pay

## 2019-06-28 ENCOUNTER — Emergency Department (HOSPITAL_COMMUNITY)
Admission: EM | Admit: 2019-06-28 | Discharge: 2019-06-29 | Disposition: A | Discharge status: Home / Self Care | Payer: BC Managed Care – PPO | Attending: Emergency Medicine | Admitting: Emergency Medicine

## 2019-06-28 ENCOUNTER — Emergency Department (HOSPITAL_COMMUNITY): Payer: BC Managed Care – PPO

## 2019-06-28 DIAGNOSIS — R112 Nausea with vomiting, unspecified: Secondary | ICD-10-CM

## 2019-06-28 DIAGNOSIS — R0789 Other chest pain: Secondary | ICD-10-CM | POA: Insufficient documentation

## 2019-06-28 DIAGNOSIS — I509 Heart failure, unspecified: Secondary | ICD-10-CM | POA: Diagnosis not present

## 2019-06-28 DIAGNOSIS — Z85850 Personal history of malignant neoplasm of thyroid: Secondary | ICD-10-CM | POA: Diagnosis not present

## 2019-06-28 DIAGNOSIS — Z853 Personal history of malignant neoplasm of breast: Secondary | ICD-10-CM | POA: Insufficient documentation

## 2019-06-28 DIAGNOSIS — M79604 Pain in right leg: Secondary | ICD-10-CM | POA: Diagnosis not present

## 2019-06-28 DIAGNOSIS — R1111 Vomiting without nausea: Secondary | ICD-10-CM | POA: Diagnosis not present

## 2019-06-28 DIAGNOSIS — R000 Tachycardia, unspecified: Secondary | ICD-10-CM | POA: Diagnosis not present

## 2019-06-28 DIAGNOSIS — I8001 Phlebitis and thrombophlebitis of superficial vessels of right lower extremity: Secondary | ICD-10-CM | POA: Diagnosis not present

## 2019-06-28 DIAGNOSIS — Z8571 Personal history of Hodgkin lymphoma: Secondary | ICD-10-CM | POA: Insufficient documentation

## 2019-06-28 DIAGNOSIS — R110 Nausea: Secondary | ICD-10-CM | POA: Diagnosis not present

## 2019-06-28 DIAGNOSIS — Z8541 Personal history of malignant neoplasm of cervix uteri: Secondary | ICD-10-CM | POA: Diagnosis not present

## 2019-06-28 DIAGNOSIS — R079 Chest pain, unspecified: Secondary | ICD-10-CM | POA: Diagnosis not present

## 2019-06-28 MED ORDER — ENOXAPARIN SODIUM 150 MG/ML ~~LOC~~ SOLN: 1.0000 mg/kg | Freq: Once | SUBCUTANEOUS | Status: DC

## 2019-06-28 MED ORDER — LACTATED RINGERS IV BOLUS
500.0000 mL | Freq: Once | INTRAVENOUS | Status: AC
  Administered 2019-06-29: 01:00:00 500 mL via INTRAVENOUS

## 2019-06-28 MED ORDER — DIPHENHYDRAMINE HCL 50 MG/ML IJ SOLN
50.0000 mg | Freq: Once | INTRAMUSCULAR | Status: AC
  Administered 2019-06-29: 50 mg via INTRAVENOUS
  Filled 2019-06-28: qty 1

## 2019-06-28 MED ORDER — HYDROMORPHONE HCL 1 MG/ML IJ SOLN
1.0000 mg | Freq: Once | INTRAMUSCULAR | Status: AC
  Administered 2019-06-29: 1 mg via INTRAVENOUS
  Filled 2019-06-28: qty 1

## 2019-06-28 MED ORDER — DIPHENHYDRAMINE HCL 50 MG/ML IJ SOLN: 25.0000 mg | Freq: Once | INTRAMUSCULAR | Status: DC

## 2019-06-28 MED ORDER — SODIUM CHLORIDE 0.9% FLUSH: 3.0000 mL | Freq: Once | INTRAVENOUS | Status: DC

## 2019-06-28 NOTE — ED Provider Notes (Signed)
Emergency Department Provider Note   I have reviewed the triage vital signs and the nursing notes.   HISTORY  Chief Complaint Swelling Right Leg/Chest pain   HPI [name] is a 48 y.o. female who presents for worsening pain with phlebitis.  Patient has a long history of multiple medical issues most recently diagnosed with recurrent superficial thrombophlebitis on ultrasound yesterday.  Was started on Xarelto where she has been on Lovenox multiple times in the past for the same condition.  She states she has worsening pain in that area with some red streaking and worsening erythema as well.  She states that the pain so bad that she cannot walk normally as hurts when her thighs rub.   She states she has limited chest pressure right now but that did not start until ambulance asked her about it.  She states that both episodes of vomiting after taking the Xarelto but she also states she vomits every day and Benadryl usually helps it.  Has no shortness of breath or fever.  No abdominal pain, diarrhea, constipation or urinary symptoms.  No other associated or modifying symptoms.    Past Medical History:  Diagnosis Date   Addison's disease ([place])    Adrenal insufficiency ([place])    Anemia    Anxiety    Aortic stenosis    Aortic stenosis    Appendicitis 12/19/09   Appendicitis    Breast cancer (HCC)    STATUS POST BILATERAL MASTECTOMY. STATUS POST RECONSTRUCTION. SHE HAD SILICONE BREAST IMPLANTS AND THE LEFT IMPLANT IS LEAKING SLIGHTLY   Cellulitis of right middle finger 11/07/2018   Cervical cancer ([place]) 12/23/2018   Chest pain    CHF with right heart failure ([place]) 04/17/2017   Chronic respiratory failure with hypoxia (HCC) 12/23/2018   Cough variant asthma 04/13/2019   Depression    GERD (gastroesophageal reflux disease)    Headache    migraines   Heart murmur    History of kidney stones    Hodgkin lymphoma ([place])    STATUS POST MANTLE RADIATION   Hodgkin's  lymphoma ([place])    1987   Hypertension    Hypoxia    Necrotizing fasciitis ([place]) 12/23/2018   Non-ischemic cardiomyopathy ([place])    Osteoporosis    Palpitations    Pituitary adenoma ([place]) 12/23/2018   Pneumonia    PONV (postoperative nausea and vomiting)    Pre-diabetes    per pt; no meds   Pulmonary hypertension ([place]) 12/23/2018   Raynaud phenomenon    Right heart failure ([place]) 04/17/2017   Supplemental oxygen dependent    3 liters   SVT (supraventricular tachycardia) (HCC)    Tachycardia    Thyroid cancer ([place])    STATUS POST SURGICAL REMOVAL-CURRENT ON THYROID REPLACEMENT    Patient Active Problem List   Diagnosis Date Noted   Cough variant asthma 04/13/2019   Injury of left index finger 12/24/2018   Pituitary adenoma ([place]) 12/23/2018   History of cervical cancer 12/23/2018   Chronic respiratory failure with hypoxia ([place]) 12/23/2018   SOB (shortness of breath)    Hemoptysis 11/11/2018   Nephrolithiasis 11/07/2018   Oxygen dependent 11/07/2018   Migraine 11/07/2018   PVC's (premature ventricular contractions) 10/27/2018   Dizziness 11/16/2014   Hyperlipidemia 03/09/2014   Hx of Hodgkins lymphoma    History of ductal carcinoma in situ (DCIS) of breast    Thyroid cancer ([place])    Adrenal insufficiency (HCC)    Hypertension    Raynaud phenomenon  GERD (gastroesophageal reflux disease)     Past Surgical History:  Procedure Laterality Date   ABDOMINAL HYSTERECTOMY     AMPUTATION Left 01/30/2019   Procedure: Left Index finger amputation with flap reconstruction and repair reconstruction;  Surgeon: Roseanne Kaufman, MD;  Location: [facility];  Service: Orthopedics;  Laterality: Left;   APPENDECTOMY     CARDIAC CATHETERIZATION  05/18/09   NORMAL CATH   I&D EXTREMITY Left 12/23/2018   Procedure: IRRIGATION AND DEBRIDEMENT HAND / INDEX FINGER;  Surgeon: Roseanne Kaufman, MD;  Location: [facility];  Service: Orthopedics;  Laterality: Left;    MASTECTOMY     PITUITARY SURGERY     RIGHT/LEFT HEART CATH AND CORONARY ANGIOGRAPHY N/A 04/02/2018   Procedure: RIGHT/LEFT HEART CATH AND CORONARY ANGIOGRAPHY;  Surgeon: Burnell Blanks, MD;  Location: [facility];  Service: Cardiovascular;  Laterality: N/A;   TOTAL THYROIDECTOMY     VIDEO BRONCHOSCOPY Bilateral 11/14/2018   Procedure: VIDEO BRONCHOSCOPY WITHOUT FLUORO;  Surgeon: Margaretha Seeds, MD;  Location: [facility];  Service: Cardiopulmonary;  Laterality: Bilateral;   VIDEO BRONCHOSCOPY WITH ENDOBRONCHIAL ULTRASOUND N/A 11/19/2018   Procedure: VIDEO BRONCHOSCOPY WITH ENDOBRONCHIAL ULTRASOUND;  Surgeon: Margaretha Seeds, MD;  Location: [facility];  Service: Thoracic;  Laterality: N/A;    Current Outpatient Rx   Order #: [id number] Class: Historical Med   Order #: [id number] Class: Historical Med   Order #: [id number] Class: Historical Med   Order #: [id number] Class: Historical Med   Order #: [id number] Class: Historical Med   Order #: [id number] Class: Normal   Order #: [id number] Class: Historical Med   Order #: [id number] Class: Historical Med   Order #: [id number] Class: Normal   Order #: [id number] Class: Historical Med   Order #: [id number] Class: Historical Med   Order #: [id number] Class: Historical Med   Order #: [id number] Class: Historical Med   Order #: [id number] Class: Historical Med   Order #: [id number] Class: Historical Med   Order #: [id number] Class: Historical Med   Order #: [id number] Class: Historical Med   Order #: [id number] Class: Historical Med   Order #: [id number] Class: Historical Med   Order #: [id number] Class: Normal   Order #: [id number] Class: Historical Med   Order #: [id number] Class: Historical Med   Order #: [id number] Class: Historical Med   Order #: [id number] Class: Historical Med   Order #: [id number] Class: Historical Med   Order #: [id number] Class: Normal   Order #: [id number] Class: Normal    Allergies Na ferric gluc cplx in sucrose,  Cymbalta [duloxetine hcl], Mushroom extract complex, Succinylcholine, Ativan [lorazepam], Buprenorphine hcl, Compazine, Metoclopramide, Morphine and related, Ondansetron, Promethazine hcl, and Tegaderm ag mesh [silver]  Family History  Family history unknown: Yes    Social History Social History   Tobacco Use   Smoking status: Never Smoker   Smokeless tobacco: Never Used  Substance Use Topics   Alcohol use: Yes    Comment: social    Drug use: No    Review of Systems  All other systems negative except as documented in the HPI. All pertinent positives and negatives as reviewed in the HPI. ____________________________________________   PHYSICAL EXAM:  VITAL SIGNS: ED Triage Vitals  Enc Vitals Group     BP 06/28/19 2246 125/64     Pulse Rate 06/28/19 2246 89     Resp 06/28/19 2246 20     Temp 06/28/19 2246 98.8 F (37.1 C)     Temp Source 06/28/19 2246 Oral     SpO2 06/28/19 2246 98 %    Constitutional: Alert and oriented. Well appearing and in  no acute distress. Eyes: Conjunctivae are normal. PERRL. EOMI. Head: Atraumatic. Nose: No congestion/rhinnorhea. Mouth/Throat: Mucous membranes are moist.  Oropharynx non-erythematous. Neck: No stridor.  No meningeal signs.   Cardiovascular: Normal rate, regular rhythm. Good peripheral circulation. Grossly normal heart sounds.   Respiratory: Normal respiratory effort.  No retractions. Lungs CTAB. Gastrointestinal: Soft and nontender. No distention.  Musculoskeletal: No lower extremity tenderness nor edema. No gross deformities of extremities. Neurologic:  Normal speech and language. No gross focal neurologic deficits are appreciated.  Skin:  Skin is warm, dry and intact. No rash noted. Erythema to right medial thigh with some streaking, slight warmth and ttp.   ____________________________________________   LABS (all labs ordered are listed, but only abnormal results are displayed)  Labs Reviewed  BASIC METABOLIC PANEL  - Abnormal; Notable for the following components:      Result Value   Glucose, Bld 107 (*)    Creatinine, Ser 1.10 (*)    GFR calc non Af Amer 59 (*)    All other components within normal limits  CBC  TROPONIN I (HIGH SENSITIVITY)  TROPONIN I (HIGH SENSITIVITY)   ____________________________________________  EKG   EKG Interpretation  Date/Time:  Sunday June 28 2019 22:50:15 EDT Ventricular Rate:  92 PR Interval:  152 QRS Duration: 78 QT Interval:  374 QTC Calculation: 462 R Axis:   -24 Text Interpretation:  Normal sinus rhythm Normal ECG No significant change since last tracing Confirmed by Merrily Pew [phone]) on 06/28/2019 11:51:43 PM      ____________________________________________  RADIOLOGY  Dg Chest 2 View  Result Date: 06/28/2019 CLINICAL DATA:  48 year old female recently diagnosed with right leg venous thrombosis. Chest pressure. EXAM: CHEST - 2 VIEW COMPARISON:  04/17/2019 chest radiographs and earlier. FINDINGS: Upright AP and lateral views. Stable right chest power port. Stable chest wall/axillary surgical clips bilaterally. Mildly lower lung volumes. No pneumothorax, pleural effusion, pulmonary edema or confluent pulmonary opacity. Stable mildly increased interstitial markings. Paucity of bowel gas today in the upper abdomen. No acute osseous abnormality identified. IMPRESSION: Mildly lower lung volumes.  No acute cardiopulmonary abnormality. Electronically Signed   By: Genevie Ann M.D.   On: 06/28/2019 23:18    ____________________________________________   PROCEDURES  Procedure(s) performed:   Procedures   ____________________________________________   INITIAL IMPRESSION / ASSESSMENT AND PLAN / ED COURSE  Will switch to lovenox.  eval for vomiting/dehydration. If symptoms improve, will have her come back for [place] in AM.  Bedside ultrasound by myself showed slight extension of the clot but no evidence of cellulitis.  No indication for antibiotics.   Will have her resume her Lovenox (in the morning after xarelto has cleared) and follow-up with her team of physicians.  Pertinent labs & imaging results that were available during my care of the patient were reviewed by me and considered in my medical decision making (see chart for details).  A medical screening exam was performed and I feel the patient has had an appropriate workup for their chief complaint at this time and likelihood of emergent condition existing is low. They have been counseled on decision, discharge, follow up and which symptoms necessitate immediate return to the emergency department. They or their family verbally stated understanding and agreement with plan and discharged in stable condition.   ____________________________________________  FINAL CLINICAL IMPRESSION(S) / ED DIAGNOSES  Final diagnoses:  Nausea and vomiting, intractability of vomiting not specified, unspecified vomiting type  Thrombophlebitis of superficial veins of right lower extremity  MEDICATIONS GIVEN DURING THIS VISIT:  Medications  sodium chloride flush (NS) 0.9 % injection 3 mL (has no administration in time range)  heparin lock flush 100 unit/mL (has no administration in time range)  HYDROmorphone (DILAUDID) injection 1 mg (1 mg Intravenous Given 06/29/19 0051)  lactated ringers bolus 500 mL (500 mLs Intravenous New Bag/Given 06/29/19 0046)  diphenhydrAMINE (BENADRYL) injection 50 mg (50 mg Intravenous Given 06/29/19 0048)     NEW OUTPATIENT MEDICATIONS STARTED DURING THIS VISIT:  New Prescriptions   No medications on file    Note:  This note was prepared with assistance of Dragon voice recognition software. Occasional wrong-word or sound-a-like substitutions may have occurred due to the inherent limitations of voice recognition software.   Ivry Pigue, Corene Cornea, MD 06/29/19 [phone]

## 2019-06-28 NOTE — ED Triage Notes (Addendum)
Patient arrived with EMS from home reports worsening right upper thigh pain with swelling " streaks" this evening , seen here yesterday diagnosed with venous thrombosis of right leg prescribed with Xarelto , emesis x3 this evening . Patient added central chest pressure this evening / no SOB.

## 2019-06-29 ENCOUNTER — Ambulatory Visit (HOSPITAL_BASED_OUTPATIENT_CLINIC_OR_DEPARTMENT_OTHER)
Admission: RE | Admit: 2019-06-29 | Discharge: 2019-06-29 | Disposition: A | Discharge status: Home / Self Care | Payer: BC Managed Care – PPO | Source: Ambulatory Visit | Attending: Emergency Medicine | Admitting: Emergency Medicine

## 2019-06-29 ENCOUNTER — Inpatient Hospital Stay (HOSPITAL_COMMUNITY): Admit: 2019-06-29 | Payer: BC Managed Care – PPO

## 2019-06-29 DIAGNOSIS — M79609 Pain in unspecified limb: Secondary | ICD-10-CM | POA: Diagnosis not present

## 2019-06-29 DIAGNOSIS — R911 Solitary pulmonary nodule: Secondary | ICD-10-CM | POA: Diagnosis not present

## 2019-06-29 LAB — BASIC METABOLIC PANEL
Anion gap: 11 (ref 5–15)
BUN: 14 mg/dL (ref 6–20)
CO2: 22 mmol/L (ref 22–32)
Calcium: 9 mg/dL (ref 8.9–10.3)
Chloride: 105 mmol/L (ref 98–111)
Creatinine, Ser: 1.1 mg/dL — ABNORMAL HIGH (ref 0.44–1.00)
GFR calc Af Amer: >60 mL/min (ref >60)
GFR calc non Af Amer: 59 mL/min — ABNORMAL LOW (ref >60)
Glucose, Bld: 107 mg/dL — ABNORMAL HIGH (ref 70–99)
Potassium: 3.9 mmol/L (ref 3.5–5.1)
Sodium: 138 mmol/L (ref 135–145)

## 2019-06-29 LAB — CBC
HCT: 39 % (ref 36.0–46.0)
Hemoglobin: 12.4 g/dL (ref 12.0–15.0)
MCH: 29.7 pg (ref 26.0–34.0)
MCHC: 31.8 g/dL (ref 30.0–36.0)
MCV: 93.5 fL (ref 80.0–100.0)
Platelets: 264 10*3/uL (ref 150–400)
RBC: 4.17 MIL/uL (ref 3.87–5.11)
RDW: 13.8 % (ref 11.5–15.5)
WBC: 7.5 10*3/uL (ref 4.0–10.5)
nRBC: 0 % (ref 0.0–0.2)

## 2019-06-29 LAB — TROPONIN I (HIGH SENSITIVITY): Troponin I (High Sensitivity): 13 ng/L (ref <18)

## 2019-06-29 MED ORDER — HEPARIN SOD (PORK) LOCK FLUSH 100 UNIT/ML IV SOLN
500.0000 [IU] | Freq: Once | INTRAVENOUS | Status: DC
  Filled 2019-06-29: qty 5

## 2019-06-29 NOTE — Progress Notes (Signed)
RLE venous duplex       has been completed. Preliminary results can be found under CV proc through chart review. June Leap, BS, RDMS, RVT   SVT appears slightly improved from previous study.

## 2019-06-29 NOTE — Discharge Instructions (Addendum)
Start lovenox at 0700.   Return for worsening symptoms.

## 2019-06-30 ENCOUNTER — Ambulatory Visit: Payer: BC Managed Care – PPO | Admitting: Rheumatology

## 2019-06-30 NOTE — Telephone Encounter (Signed)
Responded in [place] on 06/30/19.

## 2019-06-30 NOTE — Telephone Encounter (Signed)
Dr. Loanne Drilling - [facility]

## 2019-07-09 ENCOUNTER — Emergency Department (HOSPITAL_COMMUNITY): Payer: BC Managed Care – PPO

## 2019-07-09 ENCOUNTER — Encounter (HOSPITAL_COMMUNITY): Payer: Self-pay

## 2019-07-09 ENCOUNTER — Emergency Department (HOSPITAL_COMMUNITY)
Admission: EM | Admit: 2019-07-09 | Discharge: 2019-07-09 | Disposition: A | Discharge status: Home / Self Care | Payer: BC Managed Care – PPO | Attending: Emergency Medicine | Admitting: Emergency Medicine

## 2019-07-09 ENCOUNTER — Other Ambulatory Visit: Payer: Self-pay

## 2019-07-09 ENCOUNTER — Telehealth: Payer: Self-pay | Admitting: Pulmonary Disease

## 2019-07-09 DIAGNOSIS — Z7901 Long term (current) use of anticoagulants: Secondary | ICD-10-CM | POA: Diagnosis not present

## 2019-07-09 DIAGNOSIS — Y9389 Activity, other specified: Secondary | ICD-10-CM | POA: Diagnosis not present

## 2019-07-09 DIAGNOSIS — S31131A Puncture wound of abdominal wall without foreign body, left upper quadrant without penetration into peritoneal cavity, initial encounter: Secondary | ICD-10-CM | POA: Diagnosis not present

## 2019-07-09 DIAGNOSIS — R042 Hemoptysis: Secondary | ICD-10-CM | POA: Diagnosis not present

## 2019-07-09 DIAGNOSIS — Z8541 Personal history of malignant neoplasm of cervix uteri: Secondary | ICD-10-CM | POA: Diagnosis not present

## 2019-07-09 DIAGNOSIS — R2241 Localized swelling, mass and lump, right lower limb: Secondary | ICD-10-CM | POA: Diagnosis not present

## 2019-07-09 DIAGNOSIS — Z853 Personal history of malignant neoplasm of breast: Secondary | ICD-10-CM | POA: Diagnosis not present

## 2019-07-09 DIAGNOSIS — Z8571 Personal history of Hodgkin lymphoma: Secondary | ICD-10-CM | POA: Diagnosis not present

## 2019-07-09 DIAGNOSIS — W458XXA Other foreign body or object entering through skin, initial encounter: Secondary | ICD-10-CM | POA: Insufficient documentation

## 2019-07-09 DIAGNOSIS — Y999 Unspecified external cause status: Secondary | ICD-10-CM | POA: Diagnosis not present

## 2019-07-09 DIAGNOSIS — Y92009 Unspecified place in unspecified non-institutional (private) residence as the place of occurrence of the external cause: Secondary | ICD-10-CM | POA: Insufficient documentation

## 2019-07-09 DIAGNOSIS — Z85850 Personal history of malignant neoplasm of thyroid: Secondary | ICD-10-CM | POA: Diagnosis not present

## 2019-07-09 DIAGNOSIS — I110 Hypertensive heart disease with heart failure: Secondary | ICD-10-CM | POA: Diagnosis not present

## 2019-07-09 DIAGNOSIS — Z89022 Acquired absence of left finger(s): Secondary | ICD-10-CM | POA: Insufficient documentation

## 2019-07-09 DIAGNOSIS — S3991XA Unspecified injury of abdomen, initial encounter: Secondary | ICD-10-CM | POA: Diagnosis present

## 2019-07-09 DIAGNOSIS — I509 Heart failure, unspecified: Secondary | ICD-10-CM | POA: Diagnosis not present

## 2019-07-09 DIAGNOSIS — Z86018 Personal history of other benign neoplasm: Secondary | ICD-10-CM | POA: Insufficient documentation

## 2019-07-09 DIAGNOSIS — Z79899 Other long term (current) drug therapy: Secondary | ICD-10-CM | POA: Diagnosis not present

## 2019-07-09 DIAGNOSIS — R05 Cough: Secondary | ICD-10-CM | POA: Diagnosis not present

## 2019-07-09 LAB — PROTIME-INR
INR: 0.9 (ref 0.8–1.2)
Prothrombin Time: 11.9 seconds (ref 11.4–15.2)

## 2019-07-09 LAB — COMPREHENSIVE METABOLIC PANEL
ALT: 63 U/L — ABNORMAL HIGH (ref 0–44)
AST: 41 U/L (ref 15–41)
Albumin: 4.1 g/dL (ref 3.5–5.0)
Alkaline Phosphatase: 55 U/L (ref 38–126)
Anion gap: 10 (ref 5–15)
BUN: 11 mg/dL (ref 6–20)
CO2: 21 mmol/L — ABNORMAL LOW (ref 22–32)
Calcium: 8.8 mg/dL — ABNORMAL LOW (ref 8.9–10.3)
Chloride: 107 mmol/L (ref 98–111)
Creatinine, Ser: 0.96 mg/dL (ref 0.44–1.00)
GFR calc Af Amer: >60 mL/min (ref >60)
GFR calc non Af Amer: >60 mL/min (ref >60)
Glucose, Bld: 118 mg/dL — ABNORMAL HIGH (ref 70–99)
Potassium: 3.9 mmol/L (ref 3.5–5.1)
Sodium: 138 mmol/L (ref 135–145)
Total Bilirubin: 0.2 mg/dL — ABNORMAL LOW (ref 0.3–1.2)
Total Protein: 7.4 g/dL (ref 6.5–8.1)

## 2019-07-09 LAB — CBC WITH DIFFERENTIAL/PLATELET
Abs Immature Granulocytes: 0.04 10*3/uL (ref 0.00–0.07)
Basophils Absolute: 0.1 10*3/uL (ref 0.0–0.1)
Basophils Relative: 1 %
Eosinophils Absolute: 0.1 10*3/uL (ref 0.0–0.5)
Eosinophils Relative: 1 %
HCT: 39.7 % (ref 36.0–46.0)
Hemoglobin: 12.4 g/dL (ref 12.0–15.0)
Immature Granulocytes: 1 %
Lymphocytes Relative: 31 %
Lymphs Abs: 2.2 10*3/uL (ref 0.7–4.0)
MCH: 29.2 pg (ref 26.0–34.0)
MCHC: 31.2 g/dL (ref 30.0–36.0)
MCV: 93.6 fL (ref 80.0–100.0)
Monocytes Absolute: 0.6 10*3/uL (ref 0.1–1.0)
Monocytes Relative: 9 %
Neutro Abs: 4.1 10*3/uL (ref 1.7–7.7)
Neutrophils Relative %: 57 %
Platelets: 318 10*3/uL (ref 150–400)
RBC: 4.24 MIL/uL (ref 3.87–5.11)
RDW: 13.3 % (ref 11.5–15.5)
WBC: 7.1 10*3/uL (ref 4.0–10.5)
nRBC: 0 % (ref 0.0–0.2)

## 2019-07-09 MED ORDER — DIPHENHYDRAMINE HCL 25 MG PO CAPS
50.0000 mg | ORAL_CAPSULE | Freq: Once | ORAL | Status: DC
  Filled 2019-07-09: qty 2

## 2019-07-09 MED ORDER — DIPHENHYDRAMINE HCL 50 MG/ML IJ SOLN
50.0000 mg | Freq: Once | INTRAMUSCULAR | Status: AC
  Administered 2019-07-09: 50 mg via INTRAVENOUS
  Filled 2019-07-09: qty 1

## 2019-07-09 MED ORDER — DIPHENHYDRAMINE HCL 25 MG PO CAPS: 25.0000 mg | ORAL_CAPSULE | Freq: Once | ORAL | Status: DC

## 2019-07-09 MED ORDER — DIAZEPAM 2 MG PO TABS
2.0000 mg | ORAL_TABLET | Freq: Once | ORAL | Status: AC
  Administered 2019-07-09: 18:00:00 2 mg via ORAL
  Filled 2019-07-09: qty 1

## 2019-07-09 MED ORDER — IOHEXOL 350 MG/ML SOLN
100.0000 mL | Freq: Once | INTRAVENOUS | Status: AC | PRN
  Administered 2019-07-09: 21:00:00 60 mL via INTRAVENOUS

## 2019-07-09 MED ORDER — HEPARIN SOD (PORK) LOCK FLUSH 100 UNIT/ML IV SOLN
INTRAVENOUS | Status: AC
  Administered 2019-07-09: 23:00:00 500 [IU]
  Filled 2019-07-09: qty 5

## 2019-07-09 MED ORDER — SODIUM CHLORIDE (PF) 0.9 % IJ SOLN
INTRAMUSCULAR | Status: AC
  Filled 2019-07-09: qty 50

## 2019-07-09 NOTE — ED Notes (Addendum)
Patient transported to CT 

## 2019-07-09 NOTE — ED Triage Notes (Signed)
Pt reports she was diagnosed with large blood clot in R left back on 9/27 at [facility]. Pt was started on Lovenox and has been taking that ever since. Pt reports taking half dose x3 days due to almost running out of Lovenox and not able to get into PCP until February. Pt reports doing Lovenox injection last night and woke up this morning and noticed her shirt was soaked with blood while trying to make her bed. Bleeding is controlled at this time but pt reports the bleeding starts again with movement.

## 2019-07-09 NOTE — Telephone Encounter (Signed)
Called and spoke to patient. Patient stated that she was changed from Xarelto to Lovenox in the most treatment of her DVT.  Patient stated she has many concerns and couldn't see a hematologist at [facility] until the next available in February. Patient reported that she had decreased her Lovenox injections because she was running out.  She gave herself injection last evening and woke up this morning with her clothing and pillow bloody and she continues to bleed from the small injection site. Patient also reported being very concerned about increased hemoptysis. Patient is currently in at [facility] emergency room. Patient is fearful to go home and be alone with this bleeding.  Patient was tearful and expressed being worried.  Patient wanted to make sure that Dr. Loanne Drilling is aware. Routing to [place].

## 2019-07-09 NOTE — Telephone Encounter (Signed)
Thank you for the message. Will have to defer to ED physician evaluation to determine need for admission. Primary team can consider Pulmonary consult if indicated.

## 2019-07-09 NOTE — ED Provider Notes (Signed)
[facility] DEPT Provider Note   CSN: [id number] Arrival date & time: 07/09/19  1534     History   Chief Complaint Chief Complaint  Patient presents with   Injection Site Bleeding    HPI [name] is a 48 y.o. female.  With an extensive past medical history.  She is currently being treated for a DVT of the left lower extremity.  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  Patient is very worried because she is on Lovenox and she states that her hemoptysis has worsened.  She give herself a Lovenox injection in the abdomen and while she was getting ready to make her bed felt warm trickle of blood looked down and saw that her shirt was soaked and when she went to pull her sheets back realized that she had also soaked the pillow she hugs her stomach at night and the sheets underneath.  Patient states that she was very afraid that she might have lost a significant amount of blood.  She was sent here to the emergency department for further evaluation.  She has no other complaints at this time.     HPI  Past Medical History:  Diagnosis Date   Addison's disease ([place])    Adrenal insufficiency (HCC)    Anemia    Anxiety    Aortic stenosis    Aortic stenosis    Appendicitis 12/19/09   Appendicitis    Breast cancer (HCC)    STATUS POST BILATERAL MASTECTOMY. STATUS POST RECONSTRUCTION. SHE HAD SILICONE BREAST IMPLANTS AND THE LEFT IMPLANT IS LEAKING SLIGHTLY   Cellulitis of right middle finger 11/07/2018   Cervical cancer ([place]) 12/23/2018   Chest pain    CHF with right heart failure ([place]) 04/17/2017   Chronic respiratory failure with hypoxia (HCC) 12/23/2018   Cough variant asthma 04/13/2019   Depression    GERD (gastroesophageal reflux disease)     Headache    migraines   Heart murmur    History of kidney stones    Hodgkin lymphoma ([place])    STATUS POST MANTLE RADIATION   Hodgkin's lymphoma ([place])    1987   Hypertension    Hypoxia    Necrotizing fasciitis ([place]) 12/23/2018   Non-ischemic cardiomyopathy ([place])    Osteoporosis    Palpitations    Pituitary adenoma ([place]) 12/23/2018   Pneumonia    PONV (postoperative nausea and vomiting)    Pre-diabetes    per pt; no meds   Pulmonary hypertension ([place]) 12/23/2018   Raynaud phenomenon    Right heart failure ([place]) 04/17/2017   Supplemental oxygen dependent    3 liters   SVT (supraventricular tachycardia) (HCC)    Tachycardia    Thyroid cancer ([place])    STATUS POST SURGICAL REMOVAL-CURRENT ON THYROID REPLACEMENT    Patient Active Problem List   Diagnosis Date Noted   Cough variant asthma 04/13/2019   Injury of left index finger 12/24/2018   Pituitary adenoma ([place]) 12/23/2018   History of cervical cancer 12/23/2018   Chronic respiratory failure with hypoxia ([place]) 12/23/2018   SOB (shortness of breath)    Hemoptysis 11/11/2018   Nephrolithiasis 11/07/2018   Oxygen dependent 11/07/2018   Migraine 11/07/2018   PVC's (premature ventricular contractions) 10/27/2018   Dizziness 11/16/2014   Hyperlipidemia 03/09/2014   Hx of  Hodgkins lymphoma    History of ductal carcinoma in situ (DCIS) of breast    Thyroid cancer ([place])    Adrenal insufficiency (HCC)    Hypertension    Raynaud phenomenon    GERD (gastroesophageal reflux disease)     Past Surgical History:  Procedure Laterality Date   ABDOMINAL HYSTERECTOMY     AMPUTATION Left 01/30/2019   Procedure: Left Index finger amputation with flap reconstruction and repair reconstruction;  Surgeon: Roseanne Kaufman, MD;  Location: [facility];  Service: Orthopedics;  Laterality: Left;   APPENDECTOMY     CARDIAC CATHETERIZATION  05/18/09   NORMAL CATH   I&D EXTREMITY Left 12/23/2018   Procedure:  IRRIGATION AND DEBRIDEMENT HAND / INDEX FINGER;  Surgeon: Roseanne Kaufman, MD;  Location: [facility];  Service: Orthopedics;  Laterality: Left;   MASTECTOMY     PITUITARY SURGERY     RIGHT/LEFT HEART CATH AND CORONARY ANGIOGRAPHY N/A 04/02/2018   Procedure: RIGHT/LEFT HEART CATH AND CORONARY ANGIOGRAPHY;  Surgeon: Burnell Blanks, MD;  Location: [facility];  Service: Cardiovascular;  Laterality: N/A;   TOTAL THYROIDECTOMY     VIDEO BRONCHOSCOPY Bilateral 11/14/2018   Procedure: VIDEO BRONCHOSCOPY WITHOUT FLUORO;  Surgeon: Margaretha Seeds, MD;  Location: [facility];  Service: Cardiopulmonary;  Laterality: Bilateral;   VIDEO BRONCHOSCOPY WITH ENDOBRONCHIAL ULTRASOUND N/A 11/19/2018   Procedure: VIDEO BRONCHOSCOPY WITH ENDOBRONCHIAL ULTRASOUND;  Surgeon: Margaretha Seeds, MD;  Location: [facility];  Service: Thoracic;  Laterality: N/A;     OB History   No obstetric history on file.      Home Medications    Prior to Admission medications   Medication Sig Start Date End Date Taking? Authorizing Provider  ADDERALL XR 15 MG 24 hr capsule Take 15 mg by mouth daily. 06/19/19  Yes [provider]  albuterol (2.5 MG/3ML) 0.083% NEBU 3 mL, albuterol (5 MG/ML) 0.5% NEBU 0.5 mL Inhale 3 mg into the lungs every 6 (six) hours as needed (SOB).    Yes [provider]  albuterol (VENTOLIN HFA) 108 (90 Base) MCG/ACT inhaler Inhale 2 puffs into the lungs every 6 (six) hours as needed for wheezing or shortness of breath.   Yes [provider]  Alum & Mag Hydroxide-Simeth (GI COCKTAIL) SUSP suspension Take 30 mLs daily as needed by mouth for indigestion (BLEEDING ULCERS). Shake well.   Yes [provider]  beclomethasone (QVAR) 80 MCG/ACT inhaler Inhale 1 puff into the lungs 2 (two) times daily.    Yes [provider]  clonazePAM (KLONOPIN) 1 MG tablet Take 1 mg by mouth 2 (two) times daily.    Yes [provider]  dexlansoprazole (DEXILANT) 60  MG capsule Take 60 mg by mouth daily.     Yes [provider]  diphenhydrAMINE (BENADRYL) 50 MG/ML injection Inject 1 mL (50 mg total) into the vein every 6 (six) hours as needed (nausea). 12/25/18  Yes Rai, Ripudeep K, MD  enoxaparin (LOVENOX) 80 MG/0.8ML injection Inject 80 mg into the skin daily. 07/06/19  Yes [provider]  escitalopram (LEXAPRO) 20 MG tablet Take 20 mg by mouth at bedtime.  04/24/16  Yes [provider]  estradiol (ESTRACE) 2 MG tablet Take 2 mg by mouth at bedtime.  02/01/16  Yes [provider]  Ferrous Sulfate Dried (SLOW RELEASE IRON) 45 MG TBCR Take 1 tablet at bedtime by mouth.   Yes [provider]  fluticasone furoate-vilanterol (BREO ELLIPTA) 200-25 MCG/INH AEPB Inhale 1 puff into the  lungs daily.   Yes [provider]  furosemide (LASIX) 20 MG tablet Take 20 mg by mouth daily.    Yes [provider]  Galcanezumab-gnlm 120 MG/ML SOAJ Inject 120 mg into the skin every 28 (twenty-eight) days.  03/31/19  Yes [provider]  hydrocortisone (CORTEF) 10 MG tablet Take 5-10 mg by mouth See admin instructions. Take 10 mg in the am and 5 mg in the pm   Yes [provider]  levothyroxine (SYNTHROID, LEVOTHROID) 112 MCG tablet Take 112 mcg by mouth daily before breakfast.   Yes [provider]  metoprolol succinate (TOPROL-XL) 25 MG 24 hr tablet Take 25 mg by mouth daily.    Yes [provider]  rosuvastatin (CRESTOR) 10 MG tablet Take 10 mg by mouth daily.  02/28/18  Yes [provider]  sucralfate (CARAFATE) 1 g tablet Take 1 g by mouth daily as needed (FOR ULCERS).    Yes [provider]  topiramate (TOPAMAX) 100 MG tablet Take 150 mg by mouth at bedtime.    Yes [provider]  traMADol (ULTRAM) 50 MG tablet Take 50 mg by mouth daily as needed (migraine).  06/28/16  Yes [provider]  zolpidem (AMBIEN CR) 12.5 MG CR tablet Take 12.5 mg by mouth at  bedtime.     Yes [provider]  benzonatate (TESSALON) 200 MG capsule Take 1 capsule (200 mg total) by mouth 3 (three) times daily as needed for cough. Patient not taking: Reported on 07/09/2019 04/15/19   Martyn Ehrich, NP  EPINEPHrine 0.3 mg/0.3 mL IJ SOAJ injection Inject 0.3 mLs (0.3 mg total) into the muscle as needed for anaphylaxis. Patient not taking: Reported on 06/29/2019 05/11/19   Margaretha Seeds, MD  Mepolizumab (NUCALA) 100 MG SOLR Inject 100 mg into the skin every 28 (twenty-eight) days. 05/13/19   Margaretha Seeds, MD  OXYGEN Inhale 3.5 L into the lungs continuous.    [provider]  Rivaroxaban 15 & 20 MG TBPK Follow package directions: Take one 70m tablet by mouth twice a day. On day 22, switch to one 258mtablet once a day. Take with food. Patient not taking: Reported on 07/09/2019 06/27/19   KnDorie RankMD    Family History Family History  Family history unknown: Yes    Social History Social History   Tobacco Use   Smoking status: Never Smoker   Smokeless tobacco: Never Used  Substance Use Topics   Alcohol use: Yes    Comment: social    Drug use: No     Allergies   Na ferric gluc cplx in sucrose, Cymbalta [duloxetine hcl], Mushroom extract complex, Succinylcholine, Ativan [lorazepam], Buprenorphine hcl, Compazine, Metoclopramide, Morphine and related, Ondansetron, Promethazine hcl, and Tegaderm ag mesh [silver]   Review of Systems Review of Systems  Ten systems reviewed and are negative for acute change, except as noted in the HPI.   Physical Exam Updated Vital Signs BP (!) 146/90    Pulse 81    Temp 98.3 F (36.8 C) (Oral)    Resp 18    LMP  (LMP Unknown)    SpO2 100%   Physical Exam Vitals signs and nursing note reviewed.  Constitutional:      General: She is not in acute distress.    Appearance: She is well-developed. She is not diaphoretic.  HENT:     Head: Normocephalic and atraumatic.  Eyes:     General: No  scleral icterus.  Conjunctiva/sclera: Conjunctivae normal.  Neck:     Musculoskeletal: Normal range of motion.  Cardiovascular:     Rate and Rhythm: Normal rate and regular rhythm.     Heart sounds: Normal heart sounds. No murmur. No friction rub. No gallop.   Pulmonary:     Effort: Pulmonary effort is normal. No respiratory distress.     Breath sounds: Normal breath sounds.  Abdominal:     General: Bowel sounds are normal. There is no distension.     Palpations: Abdomen is soft. There is no mass.     Tenderness: There is no abdominal tenderness. There is no guarding.     Comments: Small puncture site in the left upper quadrant of the abdomen consistent with needle injection.  No active bleeding.  Musculoskeletal:     Right lower leg: Edema present.  Skin:    General: Skin is warm and dry.  Neurological:     Mental Status: She is alert and oriented to person, place, and time.  Psychiatric:        Behavior: Behavior normal.      ED Treatments / Results  Labs (all labs ordered are listed, but only abnormal results are displayed) Labs Reviewed  COMPREHENSIVE METABOLIC PANEL - Abnormal; Notable for the following components:      Result Value   CO2 21 (*)    Glucose, Bld 118 (*)    Calcium 8.8 (*)    ALT 63 (*)    Total Bilirubin 0.2 (*)    All other components within normal limits  CBC WITH DIFFERENTIAL/PLATELET  PROTIME-INR    EKG None  Radiology Ct Angio Chest Pe W And/or Wo Contrast  Result Date: 07/09/2019 CLINICAL DATA:  Hemoptysis while on Lovenox. EXAM: CT ANGIOGRAPHY CHEST WITH CONTRAST TECHNIQUE: Multidetector CT imaging of the chest was performed using the standard protocol during bolus administration of intravenous contrast. Multiplanar CT image reconstructions and MIPs were obtained to evaluate the vascular anatomy. CONTRAST:  44m OMNIPAQUE IOHEXOL 350 MG/ML SOLN COMPARISON:  None. FINDINGS: Cardiovascular: --Pulmonary arteries: Contrast injection is  sufficient to demonstrate satisfactory opacification of the pulmonary arteries to the segmental level, with attenuation of at least 200 HU at the main pulmonary artery. There is no pulmonary embolus. The main pulmonary artery is within normal limits for size. --Aorta: Satisfactory opacification of the thoracic aorta. No aortic dissection or other acute aortic syndrome. Conventional 3 vessel aortic branching pattern. There is mild aortic atherosclerosis. --Heart: Normal size. No pericardial effusion. Mediastinum/Nodes: No mediastinal, hilar or axillary lymphadenopathy. The visualized thyroid and thoracic esophageal course are unremarkable. Lungs/Pleura: No pulmonary nodules or masses. No pleural effusion or pneumothorax. No focal airspace consolidation. No focal pleural abnormality. Upper Abdomen: Contrast bolus timing is not optimized for evaluation of the abdominal organs. The visualized portions of the organs of the upper abdomen are normal. Musculoskeletal: No chest wall abnormality. No bony spinal canal stenosis. Review of the MIP images confirms the above findings. IMPRESSION: No pulmonary embolus or other acute thoracic abnormality. Aortic atherosclerosis (ICD10-I70.0). Electronically Signed   By: KUlyses JarredM.D.   On: 07/09/2019 21:18   Dg Chest Port 1 View  Result Date: 07/09/2019 CLINICAL DATA:  Cough EXAM: PORTABLE CHEST 1 VIEW COMPARISON:  None. FINDINGS: The heart size and mediastinal contours are within normal limits. A right-sided MediPort catheter is seen with the tip in the right atrium. Surgical clips are seen within the bilateral axilla. Both lungs are clear. The visualized skeletal structures are unremarkable. IMPRESSION:  No active disease. Electronically Signed   By: Prudencio Pair M.D.   On: 07/09/2019 20:44    Procedures Procedures (including critical care time)  Medications Ordered in ED Medications  diazepam (VALIUM) tablet 2 mg (2 mg Oral Given 07/09/19 1732)  diphenhydrAMINE  (BENADRYL) injection 50 mg (50 mg Intravenous Given 07/09/19 1923)  iohexol (OMNIPAQUE) 350 MG/ML injection 100 mL (60 mLs Intravenous Contrast Given 07/09/19 2046)  heparin lock flush 100 UNIT/ML injection (500 Units  Given 07/09/19 2245)     Initial Impression / Assessment and Plan / ED Course  I have reviewed the triage vital signs and the nursing notes.  Pertinent labs & imaging results that were available during my care of the patient were reviewed by me and considered in my medical decision making (see chart for details).        CC: Hemoptysis and bleeding VS:  Vitals:   07/09/19 2000 07/09/19 2030 07/09/19 2113 07/09/19 2230  BP: 128/87 (!) 121/95 126/77 (!) 146/90  Pulse: 83 82 80 81  Resp: _0 Temp:      TempSrc:      SpO2: 100% 100% 96% 100%    ZO:XWRUEAV is gathered by patient and emr. WUJ:[id number] diagnosis of hemoptysis includes, but is not limited to: bronchitis, bronchiectasis, bronchogenic carcinoma, pulmonary vasculature disease, left ventricular failure, mitral stenosis, pulmonary embolism, AV malformation, diseases of the pulmonary parenchyma including pneumonia, abscess, tuberculosis, Aspergilloma parasitic infection, Goodpasture's or Wegener's granulomatosis, SLE, crack cocaine inhalation, leukemia, anticoagulation or acute cough.  Labs: I reviewed the labs which show slightly elevated blood glucose, mildly decreased bicarb.  Slightly elevated ALT, low calcium.  CBC without any abnormality including no evidence of anemia or microcytosis.  PT/INR within normal limits. Imaging: I personally reviewed the images (CT angiogram of the chest) which show(s) no acute abnormalities EKG: n/a MDM: Patient here with complaint of bleeding.  The patient is significantly anxious, borderline terrified of bleeding.  I speculate that given the patient's significant medical history she has a lot of health-related anxiety.  Ultimately I do not feel that there are any  emergent issues after reviewing the data points and imaging in this work-up.  I suspect that given the known leaking vessel that has caused her previous hemoptysis anticoagulation may have increased her bleeding however she does not have any evidence of acute pulmonary embolus, lung infarct, mass, infection or other emergent cause of her bleeding.  She has no active bleeding from the puncture site on her abdomen.  I did apply Dermabond.  No evidence of acute hemorrhage, normal BUN, normal vital signs.  Had a long discussion with the patient and her husband and feel that she is safe for discharge with close outpatient follow-up with the patient would like a referral to hematology Patient disposition: Discharge Patient condition: Good. The patient appears reasonably screened and/or stabilized for discharge and I doubt any other medical condition or other [facility] requiring further screening, evaluation, or treatment in the ED at this time prior to discharge. I have discussed lab and/or imaging findings with the patient and answered all questions/concerns to the best of my ability. I have discussed return precautions and OP follow up.      Final Clinical Impressions(s) / ED Diagnoses   Final diagnoses:  Hemoptysis  Anticoagulated    ED Discharge Orders    None       Margarita Mail, PA-C 07/09/19 2323    Drenda Freeze, MD 07/10/19 2038

## 2019-07-09 NOTE — Discharge Instructions (Addendum)
Your chest CT scan was negative for any abnormalities and I am sending you home with all of your results from our work-up.  A thorough review review of your labs and imaging does not reveal any emergent issue requiring admission today.  Please follow very closely with your primary care physician within the next 2 days.  Return for any worsening in your symptoms including weakness, severely worsening shortness of breath, passing out, intractable vomiting.

## 2019-07-09 NOTE — ED Notes (Signed)
Pt husband at bedside visiting with pt.

## 2019-07-10 ENCOUNTER — Other Ambulatory Visit (HOSPITAL_COMMUNITY): Payer: Self-pay | Admitting: Internal Medicine

## 2019-07-10 DIAGNOSIS — I824Y1 Acute embolism and thrombosis of unspecified deep veins of right proximal lower extremity: Secondary | ICD-10-CM

## 2019-07-13 ENCOUNTER — Ambulatory Visit (HOSPITAL_COMMUNITY)
Admission: RE | Admit: 2019-07-13 | Discharge: 2019-07-13 | Disposition: A | Discharge status: Home / Self Care | Payer: BC Managed Care – PPO | Source: Ambulatory Visit | Attending: Internal Medicine | Admitting: Internal Medicine

## 2019-07-13 ENCOUNTER — Other Ambulatory Visit (HOSPITAL_COMMUNITY): Payer: Self-pay | Admitting: Internal Medicine

## 2019-07-13 ENCOUNTER — Other Ambulatory Visit: Payer: Self-pay

## 2019-07-13 DIAGNOSIS — I82811 Embolism and thrombosis of superficial veins of right lower extremities: Secondary | ICD-10-CM

## 2019-07-13 NOTE — Progress Notes (Signed)
RLE venous duplex to follow up superficial thrombosis.   Patient has Hx of SVT Not DVT in leg.  SVT has resolved in right leg.    Preliminary results can be found under CV proc through chart review. June Leap, BS, RDMS, RVT

## 2019-07-15 ENCOUNTER — Telehealth: Payer: Self-pay | Admitting: Hematology and Oncology

## 2019-07-15 NOTE — Telephone Encounter (Signed)
Received a new patient referral from Mrs. Rollene Rotunda to see a hematologist for DVT. She's been cld and scheduled to see Dr. Lindi Adie on 10/15 at 1pm. Pt aware to arrive 20 minutes early.

## 2019-07-15 NOTE — Progress Notes (Signed)
[facility] NOTE  Patient Care Team: Toppin, Antionette Poles, MD as PCP - General (Internal Medicine) Nahser, Wonda Cheng, MD as PCP - Cardiology (Cardiology) Maylon Peppers, MD (Family Medicine)  CHIEF COMPLAINTS/PURPOSE OF CONSULTATION:  History of recurrent superficial venous thrombosis   HISTORY OF PRESENTING ILLNESS:  [name] 48 y.o. female is here because of recent diagnosis of recurrent superficial venous thromnosis. On 06/27/19 she presented to the ED for right thigh pain. US showed a superficial venous thrombosis in her right thigh. She was discharged on Xarelto. She returned to the ED the next day with increasing pain in her thigh and chest. CXR showed no evidence of PE and she was switched to Lovenox. She presents to the clinic today for initial evaluation and discussion of anticoagulation.  Most recent ultrasound did not show any further DVT and therefore I recommended that she discontinue Lovenox.  She has an extensive history of cancers. When she was age 26 she was diagnosed with Hodgkin's disease stage IVb and received ABVD-MOPP followed by mantle radiation.  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  She subsequently underwent Taxotere and Cytoxan chemotherapy.  Apparently it was HER-2 positive and received anti-HER-2 therapy.  She did not take any antiestrogens.  She was then diagnosed with cervical cancer and ovarian cancer and she underwent oophorectomy and hysterectomy.  She was also diagnosed with thyroid cancer.  She has been following [facility] for her oncology needs.  Recently because of superficial vein thrombosis she was started on anticoagulation initially with Xarelto then later switched to Lovenox.  She has been sent to [place] to discuss whether or not she needs to continue with anticoagulation.  She has been on it for the past 6  weeks.  I reviewed her records extensively and collaborated the history with the patient.  SUMMARY OF ONCOLOGIC HISTORY: Oncology History  Hx of Hodgkins lymphoma   Initial Diagnosis   Hx of Hodgkins lymphoma    Radiation Therapy     Thyroid cancer ([place])   Initial Diagnosis   Thyroid cancer (HCC)    Remission      Remission      Radiation Therapy     History of cervical cancer  12/23/2018 Initial Diagnosis   History of cervical cancer    Remission      Radiation Therapy        MEDICAL HISTORY:  Past Medical History:  Diagnosis Date   Addison's disease ([place])    Adrenal insufficiency ([place])    Anemia    Anxiety    Aortic stenosis    Aortic stenosis    Appendicitis 12/19/09   Appendicitis    Breast cancer (HCC)    STATUS POST BILATERAL MASTECTOMY. STATUS POST RECONSTRUCTION. SHE HAD SILICONE BREAST IMPLANTS AND THE LEFT IMPLANT IS LEAKING SLIGHTLY   Cellulitis of right middle finger 11/07/2018   Cervical cancer ([place]) 12/23/2018   Chest pain    CHF with right heart failure ([place]) 04/17/2017   Chronic respiratory failure with hypoxia (HCC) 12/23/2018   Cough variant asthma 04/13/2019   Depression    GERD (gastroesophageal reflux disease)    Headache    migraines   Heart murmur    History of kidney stones    Hodgkin lymphoma ([place])    STATUS POST MANTLE RADIATION   Hodgkin's lymphoma ([place])    1987  Hypertension    Hypoxia    Necrotizing fasciitis ([place]) 12/23/2018   Non-ischemic cardiomyopathy ([place])    Osteoporosis    Palpitations    Pituitary adenoma ([place]) 12/23/2018   Pneumonia    PONV (postoperative nausea and vomiting)    Pre-diabetes    per pt; no meds   Pulmonary hypertension ([place]) 12/23/2018   Raynaud phenomenon    Right heart failure ([place]) 04/17/2017   Supplemental oxygen dependent    3 liters   SVT (supraventricular tachycardia) (HCC)    Tachycardia    Thyroid cancer ([place])    STATUS POST SURGICAL  REMOVAL-CURRENT ON THYROID REPLACEMENT    SURGICAL HISTORY: Past Surgical History:  Procedure Laterality Date   ABDOMINAL HYSTERECTOMY     AMPUTATION Left 01/30/2019   Procedure: Left Index finger amputation with flap reconstruction and repair reconstruction;  Surgeon: Roseanne Kaufman, MD;  Location: [facility];  Service: Orthopedics;  Laterality: Left;   APPENDECTOMY     CARDIAC CATHETERIZATION  05/18/09   NORMAL CATH   I&D EXTREMITY Left 12/23/2018   Procedure: IRRIGATION AND DEBRIDEMENT HAND / INDEX FINGER;  Surgeon: Roseanne Kaufman, MD;  Location: [facility];  Service: Orthopedics;  Laterality: Left;   MASTECTOMY     PITUITARY SURGERY     RIGHT/LEFT HEART CATH AND CORONARY ANGIOGRAPHY N/A 04/02/2018   Procedure: RIGHT/LEFT HEART CATH AND CORONARY ANGIOGRAPHY;  Surgeon: Burnell Blanks, MD;  Location: [facility];  Service: Cardiovascular;  Laterality: N/A;   TOTAL THYROIDECTOMY     VIDEO BRONCHOSCOPY Bilateral 11/14/2018   Procedure: VIDEO BRONCHOSCOPY WITHOUT FLUORO;  Surgeon: Margaretha Seeds, MD;  Location: [facility];  Service: Cardiopulmonary;  Laterality: Bilateral;   VIDEO BRONCHOSCOPY WITH ENDOBRONCHIAL ULTRASOUND N/A 11/19/2018   Procedure: VIDEO BRONCHOSCOPY WITH ENDOBRONCHIAL ULTRASOUND;  Surgeon: Margaretha Seeds, MD;  Location: [facility];  Service: Thoracic;  Laterality: N/A;    SOCIAL HISTORY: Social History   Socioeconomic History   Marital status: Married    Spouse name: Not on file   Number of children: Not on file   Years of education: Not on file   Highest education level: Not on file  Occupational History   Not on file  Social Needs   Financial resource strain: Not on file   Food insecurity    Worry: Not on file    Inability: Not on file   Transportation needs    Medical: Not on file    Non-medical: Not on file  Tobacco Use   Smoking status: Never Smoker   Smokeless tobacco: Never Used  Substance and Sexual Activity   Alcohol  use: Yes    Comment: social    Drug use: No   Sexual activity: Yes    Birth control/protection: Surgical  Lifestyle   Physical activity    Days per week: Not on file    Minutes per session: Not on file   Stress: Not on file  Relationships   Social connections    Talks on phone: Not on file    Gets together: Not on file    Attends religious service: Not on file    Active member of club or organization: Not on file    Attends meetings of clubs or organizations: Not on file    Relationship status: Not on file   Intimate partner violence    Fear of current or ex partner: Not on file    Emotionally abused: Not on file    Physically abused: Not on file  Forced sexual activity: Not on file  Other Topics Concern   Not on file  Social History Narrative   Not on file    FAMILY HISTORY: Family History  Family history unknown: Yes    ALLERGIES:  is allergic to na ferric gluc cplx in sucrose; cymbalta [duloxetine hcl]; mushroom extract complex; succinylcholine; ativan [lorazepam]; buprenorphine hcl; compazine; metoclopramide; morphine and related; ondansetron; promethazine hcl; and tegaderm ag mesh [silver].  MEDICATIONS:  Current Outpatient Medications  Medication Sig Dispense Refill   ADDERALL XR 15 MG 24 hr capsule Take 15 mg by mouth daily.     albuterol (2.5 MG/3ML) 0.083% NEBU 3 mL, albuterol (5 MG/ML) 0.5% NEBU 0.5 mL Inhale 3 mg into the lungs every 6 (six) hours as needed (SOB).      albuterol (VENTOLIN HFA) 108 (90 Base) MCG/ACT inhaler Inhale 2 puffs into the lungs every 6 (six) hours as needed for wheezing or shortness of breath.     Alum & Mag Hydroxide-Simeth (GI COCKTAIL) SUSP suspension Take 30 mLs daily as needed by mouth for indigestion (BLEEDING ULCERS). Shake well.     beclomethasone (QVAR) 80 MCG/ACT inhaler Inhale 1 puff into the lungs 2 (two) times daily.      benzonatate (TESSALON) 200 MG capsule Take 1 capsule (200 mg total) by mouth 3 (three)  times daily as needed for cough. (Patient not taking: Reported on 07/09/2019) 45 capsule 1   clonazePAM (KLONOPIN) 1 MG tablet Take 1 mg by mouth 2 (two) times daily.      dexlansoprazole (DEXILANT) 60 MG capsule Take 60 mg by mouth daily.       diphenhydrAMINE (BENADRYL) 50 MG/ML injection Inject 1 mL (50 mg total) into the vein every 6 (six) hours as needed (nausea). 25 mL 0   enoxaparin (LOVENOX) 80 MG/0.8ML injection Inject 80 mg into the skin daily.     EPINEPHrine 0.3 mg/0.3 mL IJ SOAJ injection Inject 0.3 mLs (0.3 mg total) into the muscle as needed for anaphylaxis. (Patient not taking: Reported on 06/29/2019) 1 each 3   escitalopram (LEXAPRO) 20 MG tablet Take 20 mg by mouth at bedtime.   1   estradiol (ESTRACE) 2 MG tablet Take 2 mg by mouth at bedtime.   11   Ferrous Sulfate Dried (SLOW RELEASE IRON) 45 MG TBCR Take 1 tablet at bedtime by mouth.     fluticasone furoate-vilanterol (BREO ELLIPTA) 200-25 MCG/INH AEPB Inhale 1 puff into the lungs daily.     furosemide (LASIX) 20 MG tablet Take 20 mg by mouth daily.      Galcanezumab-gnlm 120 MG/ML SOAJ Inject 120 mg into the skin every 28 (twenty-eight) days.      hydrocortisone (CORTEF) 10 MG tablet Take 5-10 mg by mouth See admin instructions. Take 10 mg in the am and 5 mg in the pm     levothyroxine (SYNTHROID, LEVOTHROID) 112 MCG tablet Take 112 mcg by mouth daily before breakfast.     Mepolizumab (NUCALA) 100 MG SOLR Inject 100 mg into the skin every 28 (twenty-eight) days. 1 each 3   metoprolol succinate (TOPROL-XL) 25 MG 24 hr tablet Take 25 mg by mouth daily.      OXYGEN Inhale 3.5 L into the lungs continuous.     Rivaroxaban 15 & 20 MG TBPK Follow package directions: Take one 32m tablet by mouth twice a day. On day 22, switch to one 264mtablet once a day. Take with food. (Patient not taking: Reported on 07/09/2019) 51 each 0  rosuvastatin (CRESTOR) 10 MG tablet Take 10 mg by mouth daily.      sucralfate (CARAFATE)  1 g tablet Take 1 g by mouth daily as needed (FOR ULCERS).      topiramate (TOPAMAX) 100 MG tablet Take 150 mg by mouth at bedtime.      traMADol (ULTRAM) 50 MG tablet Take 50 mg by mouth daily as needed (migraine).      zolpidem (AMBIEN CR) 12.5 MG CR tablet Take 12.5 mg by mouth at bedtime.       No current facility-administered medications for this visit.     REVIEW OF SYSTEMS:   Constitutional: Denies fevers, chills or abnormal night sweats Eyes: Denies blurriness of vision, double vision or watery eyes Ears, nose, mouth, throat, and face: Denies mucositis or sore throat Respiratory: Denies cough, dyspnea or wheezes Cardiovascular: Denies palpitation, chest discomfort or lower extremity swelling Gastrointestinal:  Denies nausea, heartburn or change in bowel habits Skin: Denies abnormal skin rashes Lymphatics: Denies new lymphadenopathy or easy bruising Neurological:Denies numbness, tingling or new weaknesses Behavioral/Psych: Mood is stable, no new changes  Breast: Denies any palpable lumps or discharge All other systems were reviewed with the patient and are negative.  PHYSICAL EXAMINATION: ECOG PERFORMANCE STATUS: 1 - Symptomatic but completely ambulatory  Vitals:   07/16/19 1330  BP: 110/80  Pulse: (!) 111  Resp: 18  Temp: 98.2 F (36.8 C)  SpO2: 100%   Filed Weights   07/16/19 1330  Weight: 183 lb (83 kg)    GENERAL:alert, no distress and comfortable SKIN: skin color, texture, turgor are normal, no rashes or significant lesions EYES: normal, conjunctiva are pink and non-injected, sclera clear OROPHARYNX:no exudate, no erythema and lips, buccal mucosa, and tongue normal  NECK: supple, thyroid normal size, non-tender, without nodularity LYMPH:  no palpable lymphadenopathy in the cervical, axillary or inguinal LUNGS: clear to auscultation and percussion with normal breathing effort HEART: regular rate & rhythm and no murmurs and no lower extremity  edema ABDOMEN:abdomen soft, non-tender and normal bowel sounds Musculoskeletal:no cyanosis of digits and no clubbing  PSYCH: alert & oriented x 3 with fluent speech NEURO: no focal motor/sensory deficits  LABORATORY DATA:  I have reviewed the data as listed Lab Results  Component Value Date   WBC 7.1 07/09/2019   HGB 12.4 07/09/2019   HCT 39.7 07/09/2019   MCV 93.6 07/09/2019   PLT 318 07/09/2019   Lab Results  Component Value Date   NA 138 07/09/2019   K 3.9 07/09/2019   CL 107 07/09/2019   CO2 21 (L) 07/09/2019    RADIOGRAPHIC STUDIES: I have personally reviewed the radiological reports and agreed with the findings in the report.  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  This is because it is superficial vein thrombosis and the recent ultrasound was negative.  Patient is also not symptomatic anymore.   Patient will follow with [facility]ology for her medical oncology needs.  If however she wants to switch her treatment would be happy to see her.  She will need monthly flush appointments which we can arrange in the cancer center.   All questions were answered. The patient knows to call  the clinic with any problems, questions or concerns.   Rulon Eisenmenger, MD 07/16/2019    I, Molly Dorshimer, am acting as scribe for Nicholas Lose, MD.  I have reviewed the above documentation for accuracy and completeness, and I agree with the above.

## 2019-07-16 ENCOUNTER — Inpatient Hospital Stay: Payer: BC Managed Care – PPO | Attending: Hematology and Oncology | Admitting: Hematology and Oncology

## 2019-07-16 ENCOUNTER — Other Ambulatory Visit: Payer: Self-pay

## 2019-07-16 DIAGNOSIS — Z8543 Personal history of malignant neoplasm of ovary: Secondary | ICD-10-CM | POA: Insufficient documentation

## 2019-07-16 DIAGNOSIS — I809 Phlebitis and thrombophlebitis of unspecified site: Secondary | ICD-10-CM | POA: Insufficient documentation

## 2019-07-16 DIAGNOSIS — I82811 Embolism and thrombosis of superficial veins of right lower extremities: Secondary | ICD-10-CM | POA: Diagnosis not present

## 2019-07-16 DIAGNOSIS — I110 Hypertensive heart disease with heart failure: Secondary | ICD-10-CM | POA: Diagnosis not present

## 2019-07-16 DIAGNOSIS — Z7901 Long term (current) use of anticoagulants: Secondary | ICD-10-CM | POA: Insufficient documentation

## 2019-07-16 DIAGNOSIS — Z8571 Personal history of Hodgkin lymphoma: Secondary | ICD-10-CM | POA: Diagnosis not present

## 2019-07-16 DIAGNOSIS — M810 Age-related osteoporosis without current pathological fracture: Secondary | ICD-10-CM | POA: Diagnosis not present

## 2019-07-16 DIAGNOSIS — J9611 Chronic respiratory failure with hypoxia: Secondary | ICD-10-CM | POA: Diagnosis not present

## 2019-07-16 DIAGNOSIS — K219 Gastro-esophageal reflux disease without esophagitis: Secondary | ICD-10-CM | POA: Diagnosis not present

## 2019-07-16 DIAGNOSIS — Z85850 Personal history of malignant neoplasm of thyroid: Secondary | ICD-10-CM | POA: Insufficient documentation

## 2019-07-16 DIAGNOSIS — Z79899 Other long term (current) drug therapy: Secondary | ICD-10-CM | POA: Insufficient documentation

## 2019-07-16 DIAGNOSIS — Z86000 Personal history of in-situ neoplasm of breast: Secondary | ICD-10-CM | POA: Diagnosis not present

## 2019-07-16 DIAGNOSIS — E271 Primary adrenocortical insufficiency: Secondary | ICD-10-CM | POA: Diagnosis not present

## 2019-07-16 DIAGNOSIS — Z8541 Personal history of malignant neoplasm of cervix uteri: Secondary | ICD-10-CM | POA: Diagnosis not present

## 2019-07-16 DIAGNOSIS — I50810 Right heart failure, unspecified: Secondary | ICD-10-CM | POA: Insufficient documentation

## 2019-07-16 DIAGNOSIS — Z87442 Personal history of urinary calculi: Secondary | ICD-10-CM | POA: Diagnosis not present

## 2019-07-16 DIAGNOSIS — I2729 Other secondary pulmonary hypertension: Secondary | ICD-10-CM | POA: Diagnosis not present

## 2019-07-16 DIAGNOSIS — I350 Nonrheumatic aortic (valve) stenosis: Secondary | ICD-10-CM | POA: Insufficient documentation

## 2019-07-16 NOTE — Assessment & Plan Note (Signed)
Patient has a prior history of DCIS status post bilateral mastectomies and reconstruction, Hodgkin's lymphoma status post mantle radiation, thyroid cancer, pituitary adenoma, cervical cancer  Emergency room visit: 06/28/2019, 07/09/2019 diagnosed with superficial venous thrombosis initially treated with Xarelto then switched to Lovenox  Plan: Antipossible antibody panel Treatment plan: 3 months of anticoagulation  Return to clinic in 3 months with ultrasound of the leg and follow-up.

## 2019-07-17 ENCOUNTER — Telehealth: Payer: Self-pay | Admitting: Hematology and Oncology

## 2019-07-17 NOTE — Telephone Encounter (Signed)
I left a message regarding schedule  

## 2019-07-21 ENCOUNTER — Encounter: Payer: Self-pay | Admitting: Pulmonary Disease

## 2019-07-21 ENCOUNTER — Other Ambulatory Visit: Payer: Self-pay

## 2019-07-21 ENCOUNTER — Ambulatory Visit (INDEPENDENT_AMBULATORY_CARE_PROVIDER_SITE_OTHER): Payer: BC Managed Care – PPO | Admitting: Pulmonary Disease

## 2019-07-21 DIAGNOSIS — R509 Fever, unspecified: Secondary | ICD-10-CM | POA: Diagnosis not present

## 2019-07-21 DIAGNOSIS — R0602 Shortness of breath: Secondary | ICD-10-CM

## 2019-07-21 DIAGNOSIS — Z20822 Contact with and (suspected) exposure to covid-19: Secondary | ICD-10-CM

## 2019-07-21 DIAGNOSIS — J45991 Cough variant asthma: Secondary | ICD-10-CM

## 2019-07-21 DIAGNOSIS — J9611 Chronic respiratory failure with hypoxia: Secondary | ICD-10-CM | POA: Diagnosis not present

## 2019-07-21 NOTE — Progress Notes (Signed)
Virtual Visit via Telephone Note  I connected with [name] on 07/21/19 at  2:45 PM EDT by telephone and verified that I am speaking with the correct person using two identifiers.  Location: Patient: Home Provider: [facility] - [address], [place], [place], [place] [address]   I discussed the limitations, risks, security and privacy concerns of performing an evaluation and management service by telephone and the availability of in person appointments. I also discussed with the patient that there may be a patient responsible charge related to this service. The patient expressed understanding and agreed to proceed.  Patient consented to consult via telephone: Yes People present and their role in pt care: Pt   History of Present Illness:  48 year old female never smoker followed in our office for eosinophilic asthma  Past medical history: History of Hodgkin's lymphoma, hypertension, adrenal insufficiency, GERD, PVCs, superficial venous thrombosis Smoking history: Maintenance: Breo Ellipta 200 Patient of Dr. Loanne Drilling  Chief complaint: Cough   48 year old female followed in our office for asthma.  Patient also had recent emergency room visit on 07/09/2019 for hemoptysis.  This is likely directly related to patient's recent diagnosis of superficial venous thrombosis where she was treated with Xarelto and switched to Lovenox.  Patient completed an outpatient office visit with oncology and Dr. Darnell Level regarding this.  Plan was to discontinue Lovenox anticoagulation as patient had recent ultrasound that was negative as well as patient not symptomatic anymore.  Patient to continue to follow-up with [facility] oncology for her medical oncology needs.  Patient contacted our office today reporting that she has had worsened cough as well as shortness of breath and elevated temperature over the last 3 to 5 days.  She reports that her temperature today was 101.9 at her OB/GYN office where they  refused to see the patient and recommended that they follow-up with pulmonary to obtain Covid testing.  Throughout the conversation today patient reports that she recently traveled to see family this was the first time she got anywhere besides medical appointments since the start of the Covid pandemic.  She saw her brother recently on Friday, 07/17/2019.  Patient reports that she is typically maintained on 3-1/2 L of oxygen and she has had to increase this to 5 L in order to maintain oxygen saturations above 94% which she reports is where Dr. Loanne Drilling wants to keep her oxygen levels above.  Of note, I do not see any formal documentation stating that we need to maintain oxygen saturations above 94% in her chart.  Patient has also had increased cough, increased fatigue, and increased dyspnea.  She reports that she is extremely fatigued.  Even just walking from one room to the next she feels exhausted.  Her cough is mainly dry but occasionally is productive with light mucus that is occasionally blood-tinged.  She is only maintained on a baby aspirin at this time.  She had previously been maintained on Xarelto and then Lovenox but this was stopped on 07/16/2019 by oncology.  Patient also reports that she has been using her rescue inhaler 3-4 times a day as well as her [place].  Observations/Objective:  07/21/2019 - temp - 101.9  CTA 04/13/19 - No PE. No evidence of interstitial thickening or fibrosis. R PAC in place.  V/Q 04/17/19 - No perfusion defects evident. Low probability of PE.  PFT 04/16/19 FVC 2.42 (63%) FEV1 2.03 (68%) Ratio 76  TLC 84% DLCO 75% Interpretation: Moderate obstructive defect with significant bronchodilator  response. Normal TLC and mildly reduced DLCO.  [facility]ung Pathology at [facility] 09/18/17 A. Lung, right lower lobe, endobronchial biopsy:  Bronchial tissue with mild subepithelial fibroelastosis. No evidence of infection, granulomatous inflammation, or malignancy.  CBC in  2020 significant for Absolute Eos 300 (11/15/18)  Assessment and Plan:  SOB (shortness of breath) Discussion: Based off patient's symptoms she likely has SARS-CoV-2.  Potential exposures are recent travel to see family as well as son who is recently gone back to school.  Patient symptoms are acutely worsening.  She likely also needs at least a chest x-ray.  I have recommended today for her to present to an emergency room for further evaluation [place] that we she can obtain Covid testing as well as obtain imaging studies and baseline blood work to further evaluate her acute worsening shortness of breath.  The patient reports that she is declining this at this time as she "has spent a lot of time in hospitals and emergency rooms over the past couple of weeks and throughout her life".  I emphasized again to the patient that an emergency room would be the best way to further evaluate her symptoms and ensure she gets quality care for her acute needs.  I did go ahead and order community testing for her so if she does continue to decline to present to the emergency room she could obtain testing at [facility] testing center.  Plan: We recommend today that you present to an emergency room for further evaluation  Chronic respiratory failure with hypoxia ([place]) Chronic respiratory failure due to unknown etiology No formal documentation showing that we need to maintain oxygen saturations above 94% by Dr. Loanne Drilling, I believe this is just patient's understanding and not actually a request from our office  Plan: Maintain oxygen saturations above 90%     Cough variant asthma Plan: Continue Breo Ellipta  Fever Elevated temperature of 101.9 today Worsened symptoms of fatigue Recent exposures in public in the setting of a global pandemic Worsening symptoms of shortness of breath Worsened cough  Discussion: We recommend for the patient to seek emergent evaluation.  She declined.  Will order  outpatient Covid testing so that we we can release obtain Covid results based off of her symptoms today.  Once again emphasized to the patient her need to seek emergent evaluation today at an emergency room at either [facility] or [facility] due to her acute symptoms.  Plan: We recommend the patient present to the emergency room for further evaluation  Follow Up Instructions:  Return in about 2 weeks (around 08/04/2019), or if symptoms worsen or fail to improve, for Follow up with Dr. Loanne Drilling.   I discussed the assessment and treatment plan with the patient. The patient was provided an opportunity to ask questions and all were answered. The patient agreed with the plan and demonstrated an understanding of the instructions.   The patient was advised to call back or seek an in-person evaluation if the symptoms worsen or if the condition fails to improve as anticipated.  I provided 32 minutes of non-face-to-face time during this encounter.  I have also discussed this work-up with Dr. Loanne Drilling.   Lauraine Rinne, NP

## 2019-07-21 NOTE — Assessment & Plan Note (Signed)
Chronic respiratory failure due to unknown etiology No formal documentation showing that we need to maintain oxygen saturations above 94% by Dr. Loanne Drilling, I believe this is just patient's understanding and not actually a request from our office  Plan: Maintain oxygen saturations above 90%

## 2019-07-21 NOTE — Patient Instructions (Addendum)
You were seen today by Lauraine Rinne, NP  for:   1. Fever, unspecified fever cause  - Novel Coronavirus, NAA ([facility])  2. Chronic respiratory failure with hypoxia (HCC)  3. Cough variant asthma  4. SOB (shortness of breath)  - Novel Coronavirus, NAA ([facility])   We recommend that you present to the emergency room today either [name] or [name] for further evaluation regarding your symptoms of fever, increased cough, increased shortness of breath, increased fatigue, increased oxygen needs (up to 5 L)   We recommend today:  Orders Placed This Encounter  Procedures  . Novel Coronavirus, NAA ([facility])    Order Specific Question:   Is this test for diagnosis or screening    Answer:   Diagnosis of ill patient    Order Specific Question:   Symptomatic for COVID-19 as defined by CDC    Answer:   Yes    Order Specific Question:   Date of Symptom Onset    Answer:   07/18/2019    Order Specific Question:   Hospitalized for COVID-19    Answer:   No    Order Specific Question:   Admitted to ICU for COVID-19    Answer:   No    Order Specific Question:   Previously tested for COVID-19    Answer:   Yes    Order Specific Question:   Resident in a congregate (group) care setting    Answer:   No    Order Specific Question:   Is the patient student?    Answer:   No    Order Specific Question:   Employed in healthcare setting    Answer:   No    Order Specific Question:   Pregnant    Answer:   No   Orders Placed This Encounter  Procedures  . Novel Coronavirus, NAA ([facility])   No orders of the defined types were placed in this encounter.   Follow Up:    Return in about 2 weeks (around 08/04/2019), or if symptoms worsen or fail to improve, for Follow up with Dr. Loanne Drilling.   Please do your part to reduce the spread of COVID-19:      Reduce your risk of any infection  and COVID19 by using the similar precautions used for avoiding the common cold or flu:  Marland Kitchen Wash your hands  often with soap and warm water for at least 20 seconds.  If soap and water are not readily available, use an alcohol-based hand sanitizer with at least 60% alcohol.  . If coughing or sneezing, cover your mouth and nose by coughing or sneezing into the elbow areas of your shirt or coat, into a tissue or into your sleeve (not your hands). Langley Gauss A MASK when in public  . Avoid shaking hands with others and consider head nods or verbal greetings only. . Avoid touching your eyes, nose, or mouth with unwashed hands.  . Avoid close contact with people who are sick. . Avoid places or events with large numbers of people in one location, like concerts or sporting events. . If you have some symptoms but not all symptoms, continue to monitor at home and seek medical attention if your symptoms worsen. . If you are having a medical emergency, call 911.   [place] / e-Visit: [email or web address]         [facility]: [facility]: [phone]  [facility]: [phone]     It is flu season:   >>> Best ways to protect herself from the flu: Receive the yearly flu vaccine, practice good hand hygiene washing with soap and also using hand sanitizer when available, eat a nutritious meals, get adequate rest, hydrate appropriately   Please contact the office if your symptoms worsen or you have concerns that you are not improving.   Thank you for choosing [facility] for your healthcare, and for allowing [place] to partner with you on your healthcare journey. I am thankful to be able to provide care to you today.   Wyn Quaker FNP-C

## 2019-07-21 NOTE — Assessment & Plan Note (Signed)
Discussion: Based off patient's symptoms she likely has SARS-CoV-2.  Potential exposures are recent travel to see family as well as son who is recently gone back to school.  Patient symptoms are acutely worsening.  She likely also needs at least a chest x-ray.  I have recommended today for her to present to an emergency room for further evaluation [place] that we she can obtain Covid testing as well as obtain imaging studies and baseline blood work to further evaluate her acute worsening shortness of breath.  The patient reports that she is declining this at this time as she "has spent a lot of time in hospitals and emergency rooms over the past couple of weeks and throughout her life".  I emphasized again to the patient that an emergency room would be the best way to further evaluate her symptoms and ensure she gets quality care for her acute needs.  I did go ahead and order community testing for her so if she does continue to decline to present to the emergency room she could obtain testing at [facility] testing center.  Plan: We recommend today that you present to an emergency room for further evaluation

## 2019-07-21 NOTE — Assessment & Plan Note (Signed)
Elevated temperature of 101.9 today Worsened symptoms of fatigue Recent exposures in public in the setting of a global pandemic Worsening symptoms of shortness of breath Worsened cough  Discussion: We recommend for the patient to seek emergent evaluation.  She declined.  Will order outpatient Covid testing so that we we can release obtain Covid results based off of her symptoms today.  Once again emphasized to the patient her need to seek emergent evaluation today at an emergency room at either [facility] or [facility] due to her acute symptoms.  Plan: We recommend the patient present to the emergency room for further evaluation

## 2019-07-21 NOTE — Assessment & Plan Note (Signed)
Plan: Continue Breo Ellipta

## 2019-07-22 LAB — NOVEL CORONAVIRUS, NAA: SARS-CoV-2, NAA: NOT DETECTED

## 2019-07-22 NOTE — Progress Notes (Signed)
Pt negative for covid19. This is good news.   Is the patient symptomatically doing better? Or still having worsened shortness of breath, fever, cough?  Wyn Quaker FNP

## 2019-07-23 NOTE — Telephone Encounter (Signed)
07/23/2019 0956  Cherina can we check on this patient. See her message.   We likely should proceed forward treating you as an acute bronchitis.  Is she still febrile?  What have her recent temperatures been?  We can proceed forward to treating you for an acute bronchitis patient.  Have you had any recent antibiotic use over the last 3 months?  If no recent antibiotic use can go ahead and place order for:  Azithromycin 241m tablet  >>>Take 2 tablets (5029mtotal) today, and then 1 tablet (25035mfor the next four days  >>>take with food  >>>can also take probiotic and / or yogurt while on antibiotic   If recent antibiotic use over the last 3 months then can place order for:  Augmentin >>> Take 1 875-125 mg tablet every 12 hours for the next 7 days >>> Take with food  Keep scheduled follow-up with Dr. EllLoanne DrillingCan transition that appointment to an office visit now the patient has recent negative Covid test.  Patient will still need to be afebrile in order to be seen in office.  Symptoms should be improving with antibiotic use if not patient will need to contact our office.  If patient is still acutely symptomatic with fevers at time of office visit this will need to be transition back to a video visit.  BriWyn QuakerNP

## 2019-07-29 DIAGNOSIS — R911 Solitary pulmonary nodule: Secondary | ICD-10-CM | POA: Diagnosis not present

## 2019-08-04 ENCOUNTER — Ambulatory Visit: Payer: BC Managed Care – PPO | Admitting: Pulmonary Disease

## 2019-08-04 NOTE — Progress Notes (Deleted)
Subjective:   PATIENT ID: [name] GENDER: female DOB: Feb 27, 1971, MRN: [id number]   HPI  No chief complaint on file.   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  Since our last visit, she has had an ED visit related to hemoptysis while on anticoagulation for a superficial thrombophlebitis. Lovenox was discontinued after she was evaluated by Hematology. Two weeks ago she was treated with azithromycin for suspected acute bronchitis.    CareEverywhere Records Reviewed and Summarized: Pulm at [facility]      09/18/17 Bronchoscopy with BAL. Negative cultures and negative cytology.      Dr. Geraldo Docker documented on 01/31/18 that patient's chest imaging was presented in 10/2017 and her bronchial fibroelastotic scar was felt to be a nonspecific injury related to reflux or prior XRT and not related to her chronic cough. 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  recurrence. Currently has monthly port flushes.      The note also comments on PCP work-up comments on patient reporting hematuria with UA showing 1+ blood however this is not seen on recent PCP note.  IM at [facility] -       Per recent PCP note on 03/17/19, patient previously seen by Cardiology for CHF with right heart failure and pH with EF 35% however EF normalized on echo 11/2018  Social History: Has two adult daughters 14 for her 70 yeard  old son with atypical cerebral palsy  Environmental exposures:  Hx chemotherapy  I have personally reviewed patient's past medical/family/social history, allergies, current medications.  Past Medical History:  Diagnosis Date   Addison's disease ([place])    Adrenal insufficiency ([place])    Anemia    Anxiety    Aortic stenosis    Aortic stenosis    Appendicitis 12/19/09   Appendicitis    Breast cancer (HCC)    STATUS POST BILATERAL MASTECTOMY. STATUS POST RECONSTRUCTION. SHE HAD SILICONE BREAST IMPLANTS AND THE LEFT IMPLANT IS LEAKING SLIGHTLY   Cellulitis of right middle finger 11/07/2018   Cervical cancer ([place]) 12/23/2018   Chest pain    CHF with right heart failure ([place]) 04/17/2017   Chronic respiratory failure with hypoxia (HCC) 12/23/2018   Cough variant asthma 04/13/2019   Depression    GERD (gastroesophageal reflux disease)    Headache    migraines   Heart murmur    History of kidney stones    Hodgkin lymphoma ([place])    STATUS POST MANTLE RADIATION   Hodgkin's lymphoma ([place])    1987   Hypertension    Hypoxia    Necrotizing fasciitis ([place]) 12/23/2018   Non-ischemic cardiomyopathy ([place])    Osteoporosis    Palpitations    Pituitary adenoma ([place]) 12/23/2018   Pneumonia    PONV (postoperative nausea and vomiting)    Pre-diabetes    per pt; no meds   Pulmonary hypertension ([place]) 12/23/2018   Raynaud phenomenon    Right heart failure ([place]) 04/17/2017  Supplemental oxygen dependent    3 liters   SVT (supraventricular tachycardia) (HCC)    Tachycardia    Thyroid cancer (HCC)    STATUS POST SURGICAL REMOVAL-CURRENT ON THYROID REPLACEMENT     Family History  Family history unknown: Yes     Social History   Occupational History   Not on file  Tobacco Use   Smoking status: Never Smoker   Smokeless tobacco: Never Used  Substance and Sexual Activity   Alcohol use: Yes    Comment: social    Drug use: No   Sexual activity: Yes      Birth control/protection: Surgical    Allergies  Allergen Reactions   Na Ferric Gluc Cplx In Sucrose Anaphylaxis   Cymbalta [Duloxetine Hcl] Swelling and Anxiety   Mushroom Extract Complex    Succinylcholine Other (See Comments)    Lock Jaw   Ativan [Lorazepam] Rash and Other (See Comments)    Dystonia   Buprenorphine Hcl Hives   Compazine Other (See Comments)    Altered mental status Aggression   Metoclopramide Other (See Comments)    Dystonia   Morphine And Related Hives   Ondansetron Hives and Rash   Promethazine Hcl Hives   Tegaderm Ag Mesh [Silver] Rash    Old formulation only, is able to tolerate new formulation     Outpatient Medications Prior to Visit  Medication Sig Dispense Refill   ADDERALL XR 15 MG 24 hr capsule Take 15 mg by mouth daily.     albuterol (2.5 MG/3ML) 0.083% NEBU 3 mL, albuterol (5 MG/ML) 0.5% NEBU 0.5 mL Inhale 3 mg into the lungs every 6 (six) hours as needed (SOB).      albuterol (VENTOLIN HFA) 108 (90 Base) MCG/ACT inhaler Inhale 2 puffs into the lungs every 6 (six) hours as needed for wheezing or shortness of breath.     Alum & Mag Hydroxide-Simeth (GI COCKTAIL) SUSP suspension Take 30 mLs daily as needed by mouth for indigestion (BLEEDING ULCERS). Shake well.     beclomethasone (QVAR) 80 MCG/ACT inhaler Inhale 1 puff into the lungs 2 (two) times daily.      benzonatate (TESSALON) 200 MG capsule Take 1 capsule (200 mg total) by mouth 3 (three) times daily as needed for cough. (Patient not taking: Reported on 07/09/2019) 45 capsule 1   clonazePAM (KLONOPIN) 1 MG tablet Take 1 mg by mouth 2 (two) times daily.      dexlansoprazole (DEXILANT) 60 MG capsule Take 60 mg by mouth daily.       diphenhydrAMINE (BENADRYL) 50 MG/ML injection Inject 1 mL (50 mg total) into the vein every 6 (six) hours as needed (nausea). 25 mL 0   enoxaparin (LOVENOX) 80 MG/0.8ML injection Inject 80 mg into the skin daily.     EPINEPHrine 0.3 mg/0.3 mL  IJ SOAJ injection Inject 0.3 mLs (0.3 mg total) into the muscle as needed for anaphylaxis. (Patient not taking: Reported on 06/29/2019) 1 each 3   escitalopram (LEXAPRO) 20 MG tablet Take 20 mg by mouth at bedtime.   1   estradiol (ESTRACE) 2 MG tablet Take 2 mg by mouth at bedtime.   11   Ferrous Sulfate Dried (SLOW RELEASE IRON) 45 MG TBCR Take 1 tablet at bedtime by mouth.     fluticasone furoate-vilanterol (BREO ELLIPTA) 200-25 MCG/INH AEPB Inhale 1 puff into the lungs daily.     furosemide (LASIX) 20 MG tablet Take 20 mg by mouth daily.      Galcanezumab-gnlm 120  MG/ML SOAJ Inject 120 mg into the skin every 28 (twenty-eight) days.      hydrocortisone (CORTEF) 10 MG tablet Take 5-10 mg by mouth See admin instructions. Take 10 mg in the am and 5 mg in the pm     levothyroxine (SYNTHROID, LEVOTHROID) 112 MCG tablet Take 112 mcg by mouth daily before breakfast.     Mepolizumab (NUCALA) 100 MG SOLR Inject 100 mg into the skin every 28 (twenty-eight) days. 1 each 3   metoprolol succinate (TOPROL-XL) 25 MG 24 hr tablet Take 25 mg by mouth daily.      OXYGEN Inhale 3.5 L into the lungs continuous.     Rivaroxaban 15 & 20 MG TBPK Follow package directions: Take one 9m tablet by mouth twice a day. On day 22, switch to one 236mtablet once a day. Take with food. (Patient not taking: Reported on 07/09/2019) 51 each 0   rosuvastatin (CRESTOR) 10 MG tablet Take 10 mg by mouth daily.      sucralfate (CARAFATE) 1 g tablet Take 1 g by mouth daily as needed (FOR ULCERS).      topiramate (TOPAMAX) 100 MG tablet Take 150 mg by mouth at bedtime.      traMADol (ULTRAM) 50 MG tablet Take 50 mg by mouth daily as needed (migraine).      zolpidem (AMBIEN CR) 12.5 MG CR tablet Take 12.5 mg by mouth at bedtime.       No facility-administered medications prior to visit.     Review of Systems  Constitutional: Negative for chills, diaphoresis, fever, malaise/fatigue and weight loss.  HENT: Negative  for congestion, ear pain and sore throat.   Respiratory: Positive for cough, hemoptysis, sputum production and shortness of breath. Negative for wheezing.   Cardiovascular: Negative for chest pain, palpitations and leg swelling.  Gastrointestinal: Negative for abdominal pain, heartburn and nausea.  Genitourinary: Negative for frequency.  Musculoskeletal: Negative for joint pain and myalgias.  Skin: Negative for itching and rash.  Neurological: Negative for dizziness, weakness and headaches.  Endo/Heme/Allergies: Does not bruise/bleed easily.  Psychiatric/Behavioral: Negative for depression. The patient is not nervous/anxious.      Objective:   There were no vitals filed for this visit.    Physical Exam: General: Well-appearing, no acute distress HENT: [place], AT Eyes: EOMI, no scleral icterus Respiratory: Clear to auscultation bilaterally.  No crackles, wheezing or rales Cardiovascular: RRR, -M/R/G, no JVD GI: BS+, soft, nontender Extremities: Bilateral upper extremity edema. 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 No infiltrate, edema or effusion. CT 05/05/09 - Normal parenchymal. Scattered tiny pulmonary nodules with largest 67m39mn LUL likely benign CT Chest 09/05/17 (report only): Stable bilateral pulmonary nodules up to 4mm46mUL 67mm,11mL 2 mm, RLL subpleural 4mm),45mdemonstration of tiny right diaphragmatic hernia containing fat CT Chest 11/10/18 - RML 5 mm lung nodule  PFT: None on file  Per [facility] records: PFTs 09/2015 show no obstruction, small airways dysfunction with evidence of coughing on both inspiratory and expiratory flow-volume limbs. MVV severely reduced (13% predicted) consistent with poor effort (suspect limited by coughing throughout). No restriction. DLCO 75%.   CPET Per [facility] records  CPET  03/2016 showed functional impairment with VO2max 668mpredicted. Normal oxygenation throughout on room air with post-exercise PaO2=125. HR 82% predicted. Abnormal FEV1 but ventilatory reserve remained. Conclusion:  Cardiovascular limitation to exercise possibly reflecting deconditioning. Ventilatory impairment present but not exercise-limiting.  6 min walk  Per [facility] records (12/07/16): SpO2 started at 97-98% and decreased to ~94% with ambulation. There was one episode of symptomatic dizziness during which SpO2 was 88% but with questionable tracing. Recovered to high-90s with ~10-15 seconds of standing rest break.  Ambulatory O2 Titration 04/13/19 - Patient unable to complete due to dizziness with ambulation. No desaturations documented. SpO2>95% in the time titration was attempted on room air. Per nursing, patient anxious and holding breath during testing which likely contributed to early termination of test  Labs:  ANA Screen 07/03/17 - Positive  Feb 2020 ANA - neg SSA/SSB - neg Anti-DNA ab -neg MPO/PR3 ab - ng GBM ab - neg Anti-scleroderma ab - neg APLAS - neg ANCA titers - neg RF mildly elevated 18.6  Bronchoscopy: 11/19/18   11/19/18 TBFNA station 7 (mislabeled as bronchial brushing) - negative for malignant cells  11/19/18 Respiratory/fungal, AFB from BAL of L lingula - negative  Imaging, labs and tests noted above have been reviewed independently by me.    Assessment & Plan:   Discussion: 48 year old female with history of Hodgkin's, breast cancer, cervical cancer, precancerous ovarian tumor, history of Cushing secondary to pituitary adenoma, history of thyroid "cancer ", ADHD, anxiety/depression.  Status post multiple chemo radiation treatments with all cancers currently in remission.  She was previously followed at [facility] for management of cough variant asthma, subcentimeter lung nodules (stable), and chronic hypoxemia of unknown etiology.  She reports history of pulmonary fibrosis  however on review of imaging no evidence of parenchymal disease on prior reports or chest imaging that I have personally reviewed in our system.  For her recurrence of hemoptysis, etiology is unclear as she does not have any active signs of infection.  Stat CTA was ordered to rule out PE.  I reviewed imaging which demonstrated no evidence of PE, no parenchymal abnormalities or airspace disease to explain her hemoptysis.  Patient is euvolemic.  Previously had history of right heart failure and depressed EF however this has normalized on echocardiogram on 11/2018.  Her oxygen requirement is difficult to measure due to inability to complete ambulatory testing.  She has had similar issues with [facility] pulmonology.  I have explained to her that we are unable to provide a prescription for oxygen due to insufficient data.  She is expresses understanding and will obtain records from her current DME.  Shortness of breath, hemoptysis --Coordinated STAT CTA to rule out pulmonary embolism --Will order V/Q scan if negative --Arrange for pulmonary function tests and determine need for bronchodilators  Acute on chronic hypoxemic respiratory failure - increased O2 use to 4L Chronic hypoxemic respiratory failure --Continue supplemental oxygen for goal oxygen levels >90% --Will perform ambulatory oxygen test in-clinic  Bilateral upper extremities -Referred to Rheumatology. Scheduled in October  Greater than 50% of this patient 80-minute office visit was spent face-to-face in counseling with the patient/family. We discussed medical diagnosis and treatment plan as noted.  No orders of the defined types were placed in this encounter. No orders of the defined types were placed in this encounter.   No follow-ups on file.  [place], MD [facility] 08/04/2019 9:30 AM  Office Number [phone]

## 2019-08-05 ENCOUNTER — Ambulatory Visit (INDEPENDENT_AMBULATORY_CARE_PROVIDER_SITE_OTHER): Payer: BC Managed Care – PPO | Admitting: Pulmonary Disease

## 2019-08-05 ENCOUNTER — Encounter: Payer: Self-pay | Admitting: Pulmonary Disease

## 2019-08-05 ENCOUNTER — Other Ambulatory Visit: Payer: Self-pay

## 2019-08-05 VITALS — BP 102/60 | HR 100 | Temp 97.2°F | Ht 65.0 in | Wt 184.4 lb

## 2019-08-05 DIAGNOSIS — J209 Acute bronchitis, unspecified: Secondary | ICD-10-CM

## 2019-08-05 DIAGNOSIS — J9611 Chronic respiratory failure with hypoxia: Secondary | ICD-10-CM | POA: Diagnosis not present

## 2019-08-05 DIAGNOSIS — J4540 Moderate persistent asthma, uncomplicated: Secondary | ICD-10-CM | POA: Diagnosis not present

## 2019-08-05 MED ORDER — AZITHROMYCIN 250 MG PO TABS: ORAL_TABLET | ORAL | 0 refills | Status: DC

## 2019-08-05 NOTE — Patient Instructions (Addendum)
Chronic bronchitis Shortness of breath --START azithromycin x 5 days      Schedule 6 minute walk and next available appointment with Dr. Loanne Drilling

## 2019-08-05 NOTE — Progress Notes (Signed)
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  Since our last visit, she has had an ED visit related to hemoptysis while on anticoagulation for a superficial thrombophlebitis. Lovenox was discontinued after she was evaluated by Hematology. Two weeks ago she was treated with azithromycin for suspected acute bronchitis. She reports she did not receive any antibiotics and continues to have symptoms consistent with bronchitis including shortness of breath and cough. Her symptoms limit her ability to ambulate and perform activities of daily living. She also wishes to renew her handicap permit today due to her pulmonary issues.  CareEverywhere Records Reviewed and Summarized: Pulm at [facility]      09/18/17 Bronchoscopy with BAL. Negative cultures and negative cytology.      Dr. Geraldo Docker documented on 01/31/18 that patient's chest imaging was presented in 10/2017 and her bronchial fibroelastotic scar was felt to be a nonspecific injury related to reflux or prior XRT and not related to her chronic cough. 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  recurrence. Currently has monthly port flushes.      The note also comments on PCP work-up comments on patient reporting  hematuria with UA showing 1+ blood however this is not seen on recent PCP note.  IM at [facility] -       Per recent PCP note on 03/17/19, patient previously seen by Cardiology for CHF with right heart failure and pH with EF 35% however EF normalized on echo 11/2018  Social History: Has two adult daughters 66 for her 4 yeard old son with atypical cerebral palsy  Environmental exposures:  Hx chemotherapy  I have personally reviewed patient's past medical/family/social history/allergies/current medications.  Past Medical History:  Diagnosis Date   Addison's disease ([place])    Adrenal insufficiency ([place])    Anemia    Anxiety    Aortic stenosis    Aortic stenosis    Appendicitis 12/19/09   Appendicitis    Breast cancer (HCC)    STATUS POST BILATERAL MASTECTOMY. STATUS POST RECONSTRUCTION. SHE HAD SILICONE BREAST IMPLANTS AND THE LEFT IMPLANT IS LEAKING SLIGHTLY   Cellulitis of right middle finger 11/07/2018   Cervical cancer ([place]) 12/23/2018   Chest pain    CHF with right heart failure ([place]) 04/17/2017   Chronic respiratory failure with hypoxia (HCC) 12/23/2018   Cough variant asthma 04/13/2019   Depression    GERD (gastroesophageal reflux disease)    Headache    migraines   Heart murmur    History of kidney stones    Hodgkin lymphoma ([place])    STATUS POST MANTLE RADIATION   Hodgkin's lymphoma ([place])    1987   Hypertension    Hypoxia    Necrotizing fasciitis ([place]) 12/23/2018   Non-ischemic cardiomyopathy ([place])    Osteoporosis  Palpitations    Pituitary adenoma ([place]) 12/23/2018   Pneumonia    PONV (postoperative nausea and vomiting)    Pre-diabetes    per pt; no meds   Pulmonary hypertension ([place]) 12/23/2018   Raynaud phenomenon    Right heart failure ([place]) 04/17/2017   Supplemental oxygen dependent    3 liters   SVT (supraventricular tachycardia) (HCC)    Tachycardia    Thyroid cancer ([place])    STATUS POST SURGICAL REMOVAL-CURRENT ON  THYROID REPLACEMENT     Family History  Family history unknown: Yes     Social History   Occupational History   Not on file  Tobacco Use   Smoking status: Never Smoker   Smokeless tobacco: Never Used  Substance and Sexual Activity   Alcohol use: Yes    Comment: social    Drug use: No   Sexual activity: Yes    Birth control/protection: Surgical    Allergies  Allergen Reactions   Na Ferric Gluc Cplx In Sucrose Anaphylaxis   Cymbalta [Duloxetine Hcl] Swelling and Anxiety   Mushroom Extract Complex    Succinylcholine Other (See Comments)    Lock Jaw   Ativan [Lorazepam] Rash and Other (See Comments)    Dystonia   Buprenorphine Hcl Hives   Compazine Other (See Comments)    Altered mental status Aggression   Metoclopramide Other (See Comments)    Dystonia   Morphine And Related Hives   Ondansetron Hives and Rash   Promethazine Hcl Hives   Tegaderm Ag Mesh [Silver] Rash    Old formulation only, is able to tolerate new formulation     Outpatient Medications Prior to Visit  Medication Sig Dispense Refill   ADDERALL XR 15 MG 24 hr capsule Take 15 mg by mouth daily.     albuterol (2.5 MG/3ML) 0.083% NEBU 3 mL, albuterol (5 MG/ML) 0.5% NEBU 0.5 mL Inhale 3 mg into the lungs every 6 (six) hours as needed (SOB).      albuterol (VENTOLIN HFA) 108 (90 Base) MCG/ACT inhaler Inhale 2 puffs into the lungs every 6 (six) hours as needed for wheezing or shortness of breath.     Alum & Mag Hydroxide-Simeth (GI COCKTAIL) SUSP suspension Take 30 mLs daily as needed by mouth for indigestion (BLEEDING ULCERS). Shake well.     beclomethasone (QVAR) 80 MCG/ACT inhaler Inhale 1 puff into the lungs 2 (two) times daily.      benzonatate (TESSALON) 200 MG capsule Take 1 capsule (200 mg total) by mouth 3 (three) times daily as needed for cough. (Patient not taking: Reported on 07/09/2019) 45 capsule 1   clonazePAM (KLONOPIN) 1 MG tablet Take 1 mg by mouth 2 (two) times  daily.      dexlansoprazole (DEXILANT) 60 MG capsule Take 60 mg by mouth daily.       diphenhydrAMINE (BENADRYL) 50 MG/ML injection Inject 1 mL (50 mg total) into the vein every 6 (six) hours as needed (nausea). 25 mL 0   enoxaparin (LOVENOX) 80 MG/0.8ML injection Inject 80 mg into the skin daily.     EPINEPHrine 0.3 mg/0.3 mL IJ SOAJ injection Inject 0.3 mLs (0.3 mg total) into the muscle as needed for anaphylaxis. (Patient not taking: Reported on 06/29/2019) 1 each 3   escitalopram (LEXAPRO) 20 MG tablet Take 20 mg by mouth at bedtime.   1   estradiol (ESTRACE) 2 MG tablet Take 2 mg by mouth at bedtime.   11   Ferrous Sulfate Dried (SLOW RELEASE IRON) 45  MG TBCR Take 1 tablet at bedtime by mouth.     fluticasone furoate-vilanterol (BREO ELLIPTA) 200-25 MCG/INH AEPB Inhale 1 puff into the lungs daily.     furosemide (LASIX) 20 MG tablet Take 20 mg by mouth daily.      Galcanezumab-gnlm 120 MG/ML SOAJ Inject 120 mg into the skin every 28 (twenty-eight) days.      hydrocortisone (CORTEF) 10 MG tablet Take 5-10 mg by mouth See admin instructions. Take 10 mg in the am and 5 mg in the pm     levothyroxine (SYNTHROID, LEVOTHROID) 112 MCG tablet Take 112 mcg by mouth daily before breakfast.     Mepolizumab (NUCALA) 100 MG SOLR Inject 100 mg into the skin every 28 (twenty-eight) days. 1 each 3   metoprolol succinate (TOPROL-XL) 25 MG 24 hr tablet Take 25 mg by mouth daily.      OXYGEN Inhale 3.5 L into the lungs continuous.     Rivaroxaban 15 & 20 MG TBPK Follow package directions: Take one 636m tablet by mouth twice a day. On day 22, switch to one 268mtablet once a day. Take with food. (Patient not taking: Reported on 07/09/2019) 51 each 0   rosuvastatin (CRESTOR) 10 MG tablet Take 10 mg by mouth daily.      sucralfate (CARAFATE) 1 g tablet Take 1 g by mouth daily as needed (FOR ULCERS).      topiramate (TOPAMAX) 100 MG tablet Take 150 mg by mouth at bedtime.      traMADol (ULTRAM)  50 MG tablet Take 50 mg by mouth daily as needed (migraine).      zolpidem (AMBIEN CR) 12.5 MG CR tablet Take 12.5 mg by mouth at bedtime.       No facility-administered medications prior to visit.     Review of Systems  Constitutional: Negative for chills, diaphoresis, fever, malaise/fatigue and weight loss.  HENT: Negative for congestion.   Respiratory: Positive for cough, sputum production and shortness of breath. Negative for hemoptysis and wheezing.   Cardiovascular: Negative for chest pain, palpitations and leg swelling.     Objective:   Vitals:   08/05/19 1028  BP: 102/60  Pulse: 100  Temp: (!) 97.2 F (36.2 C)  TempSrc: Temporal  SpO2: 98%  Weight: 184 lb 6.4 oz (83.6 kg)  Height: _0  (1.651 m)      Physical Exam: General: Well-appearing, no acute distress HENT: [place], AT Eyes: EOMI, no scleral icterus Respiratory: Clear to auscultation bilaterally.  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 No infiltrate, edema or effusion. CT 05/05/09 - Normal parenchymal. Scattered tiny pulmonary nodules with largest 36m70mn LUL likely benign CT Chest 09/05/17 (report only): Stable bilateral pulmonary nodules up to 4mm46mUL 36mm,46mL 2 mm, RLL subpleural 4mm),32mdemonstration of tiny right diaphragmatic hernia containing fat CT Chest 11/10/18 - RML 5 mm lung nodule  PFT: 04/16/19 FVC 2.42 (65%) FEV1 2.03 (68%) Ratio 76  TLC 84% DLCO 75% Interpretation: Moderate reversible obstructive defect. Significant BD response in FEV1.  CPET Per [facility] records  CPET 03/2016 showed functional impairment with VO2max 697mpredicted. Normal oxygenation throughout on room air with post-exercise PaO2=125. HR 82%  predicted. Abnormal FEV1 but ventilatory reserve remained. Conclusion: Cardiovascular limitation  to exercise possibly reflecting deconditioning. Ventilatory impairment present but not exercise-limiting.  6 min walk  Per [facility] records (12/07/16): SpO2 started at 97-98% and decreased to ~94% with ambulation. There was one episode of symptomatic dizziness during which SpO2 was 88% but with questionable tracing. Recovered to high-90s with ~10-15 seconds of standing rest break.  Ambulatory O2 Titration 04/13/19 - Patient unable to complete due to dizziness with ambulation. No desaturations documented. SpO2>95% in the time titration was attempted on room air. Per nursing, patient anxious and holding breath during testing which likely contributed to early termination of test  Labs:  ANA Screen 07/03/17 - Positive  Feb 2020 ANA - neg SSA/SSB - neg Anti-DNA ab -neg MPO/PR3 ab - ng GBM ab - neg Anti-scleroderma ab - neg APLAS - neg ANCA titers - neg RF mildly elevated 18.6  Bronchoscopy: 11/19/18   11/19/18 TBFNA station 7 (mislabeled as bronchial brushing) - negative for malignant cells  11/19/18 Respiratory/fungal, AFB from BAL of L lingula - negative  Imaging, labs and tests noted above have been reviewed independently by me.    Assessment & Plan:   Discussion: 48 year old female with hx of Hodgkin's, breast cancer, cervical cancer, precancerous ovarian tumor, hx of Cushing secondary to pituitary adenoma, history of thyroid "cancer", ADHD, anxiety/depression. S/p multiple chemoradiation treatments with all cancers currently in remission. She was previously followed at [facility] for management of cough variant asthma, subcentimeter lung nodules (stable), and chronic hypoxemia of unknown etiology.  She reports history of pulmonary fibrosis however on review of imaging no evidence of parenchymal disease on prior reports or chest imaging that I have personally reviewed in our system.  Prior work-up  of hemoptysis was negative for pulmonary embolism. She recently did have an episode of hemoptysis while on anticoagulation for superficial thrombophlebitis but AC has been discontinued.  She has had recent increase in our O2 usage for which she self-targeted for goal >94%. We discussed in clinic that >88% is sufficient and any higher than that is unlikely to provide any clinical benefit. We will treat for bronchitis in case her symptoms are related to an inflammatory process.  Acute bronchitis --Azithromycin x 5 days  Moderate persistent asthma --When stable, will need to set start date for Nucala.  --Continue Qvar  Acute on chronic hypoxemic respiratory failure - reports increased O2 use Chronic hypoxemic respiratory failure --Continue supplemental oxygen for goal oxygen levels >88% --6MWT ordered  Bilateral upper extremities -Unclear etiology. Previously evaluated by Rheumatology. No clear autoimmune process  Greater than 50% of this patient 80-minute office visit was spent face-to-face in counseling with the patient/family. We discussed medical diagnosis and treatment plan as noted.  Orders Placed This Encounter  Procedures   6 minute walk    Standing Status:   Future    Standing Expiration Date:   08/04/2020    Scheduling Instructions:     Oxygen titration on POC   Meds ordered this encounter  Medications   azithromycin (ZITHROMAX) 250 MG tablet    Sig: Take 2 tablets today, then 1 tablet for the next 4 days    Dispense:  6 tablet    Refill:  0    DX acute bronchitis J20.9    No follow-ups on file.  [place], MD [facility] 08/05/2019 8:11 AM  Office Number [phone]

## 2019-08-09 DIAGNOSIS — J209 Acute bronchitis, unspecified: Secondary | ICD-10-CM | POA: Insufficient documentation

## 2019-08-09 DIAGNOSIS — J4540 Moderate persistent asthma, uncomplicated: Secondary | ICD-10-CM | POA: Insufficient documentation

## 2019-08-09 DIAGNOSIS — J440 Chronic obstructive pulmonary disease with acute lower respiratory infection: Secondary | ICD-10-CM | POA: Insufficient documentation

## 2019-08-10 DIAGNOSIS — M25642 Stiffness of left hand, not elsewhere classified: Secondary | ICD-10-CM | POA: Diagnosis not present

## 2019-08-13 ENCOUNTER — Inpatient Hospital Stay: Payer: BC Managed Care – PPO | Attending: Hematology and Oncology

## 2019-08-13 ENCOUNTER — Encounter: Payer: Self-pay | Admitting: Pulmonary Disease

## 2019-08-13 ENCOUNTER — Ambulatory Visit (INDEPENDENT_AMBULATORY_CARE_PROVIDER_SITE_OTHER): Payer: BC Managed Care – PPO | Admitting: Pulmonary Disease

## 2019-08-13 ENCOUNTER — Ambulatory Visit (INDEPENDENT_AMBULATORY_CARE_PROVIDER_SITE_OTHER): Payer: BC Managed Care – PPO

## 2019-08-13 ENCOUNTER — Other Ambulatory Visit: Payer: Self-pay

## 2019-08-13 VITALS — BP 116/70 | HR 109 | Ht 65.0 in

## 2019-08-13 DIAGNOSIS — J9611 Chronic respiratory failure with hypoxia: Secondary | ICD-10-CM

## 2019-08-13 DIAGNOSIS — R0602 Shortness of breath: Secondary | ICD-10-CM

## 2019-08-13 DIAGNOSIS — R1319 Other dysphagia: Secondary | ICD-10-CM

## 2019-08-13 DIAGNOSIS — R42 Dizziness and giddiness: Secondary | ICD-10-CM

## 2019-08-13 DIAGNOSIS — I82811 Embolism and thrombosis of superficial veins of right lower extremities: Secondary | ICD-10-CM | POA: Insufficient documentation

## 2019-08-13 DIAGNOSIS — Z95828 Presence of other vascular implants and grafts: Secondary | ICD-10-CM

## 2019-08-13 DIAGNOSIS — R410 Disorientation, unspecified: Secondary | ICD-10-CM | POA: Diagnosis not present

## 2019-08-13 LAB — GLUCOSE, POCT (MANUAL RESULT ENTRY): POC Glucose: 109 mg/dl — AB (ref 70–99)

## 2019-08-13 MED ORDER — SODIUM CHLORIDE 0.9% FLUSH
10.0000 mL | Freq: Once | INTRAVENOUS | Status: AC
  Administered 2019-08-13: 11:00:00 10 mL
  Filled 2019-08-13: qty 10

## 2019-08-13 MED ORDER — HEPARIN SOD (PORK) LOCK FLUSH 100 UNIT/ML IV SOLN
500.0000 [IU] | Freq: Once | INTRAVENOUS | Status: AC
  Administered 2019-08-13: 500 [IU]
  Filled 2019-08-13: qty 5

## 2019-08-13 MED ORDER — ALBUTEROL SULFATE (2.5 MG/3ML) 0.083% IN NEBU
2.5000 mg | INHALATION_SOLUTION | Freq: Once | RESPIRATORY_TRACT | Status: AC
  Administered 2019-08-13: 2.5 mg via RESPIRATORY_TRACT

## 2019-08-13 NOTE — Progress Notes (Signed)
SIX MIN WALK 08/13/2019 04/13/2019  Medications metoprolol 23m, synthroid 1173m, and dexilant 6058mt 630 and adderall 53m95mlonopin 1mg,76msix 40mg,59mstor 10mg, 25mcortef 10mg at5m -  Supplimental Oxygen during Test? (L/min) Yes No  O2 Flow Rate 4 -  Type Pulse -  Laps 5 -  Partial Lap (in Meters) 0 -  Baseline BP (sitting) 118/76 -  Baseline Heartrate 114 -  Baseline Dyspnea (Borg Scale) 3 -  Baseline Fatigue (Borg Scale) 3 -  Baseline SPO2 97 -  Stopped or Paused before Six Minutes Yes -  Other Symptoms at end of Exercise The walk was stopped with 1 minute 52 seconds remaining. Pt dropped her O2 cart then went down to her knees stating that she was very dizzy and was also very confused. -  Interpretation Dizziness -  Distance Completed 170 -  Tech Comments: Pt walked at a slow pace with portable oxygen cart coughing the entire time during the walk. The walk was stopped with 1 minute 52 seconds remaining. Pt dropped her O2 cart then went down to her knees. Pt stated that she was very dizzy and also became very confused not knowing where she was. Pt then repeated over and over that she needed to get off of the ground as she was supposed to be completing a test but pt still was very confused while she was saying this. Pt lost her nasal cannula and her mask when she went to the ground. patient did one lap without Oxygen on. had to stop multiple times no desats just felt she couldn't get her breath. No desaturations, but dizziness and pain under her left breast no radiating of pain. when returned to room patient states had headache checked BP 102/68 pulse 101.

## 2019-08-13 NOTE — Patient Instructions (Signed)
Moderate persistent Asthma Arrange for follow-up in 2 weeks followed by 1st Nucala injection  Chronic hypoxemic respiratory failure Your goal oxygen level is greater than 88%. Try to give yourself a break on lower oxygen levels as long as it is above 88%.

## 2019-08-13 NOTE — Patient Instructions (Signed)
Implanted [facility] Guide An implanted port is a device that is placed under the skin. It is usually placed in the chest. The device can be used to give IV medicine, to take blood, or for dialysis. You may have an implanted port if:  You need IV medicine that would be irritating to the small veins in your hands or arms.  You need IV medicines, such as antibiotics, for a long period of time.  You need IV nutrition for a long period of time.  You need dialysis. Having a port means that your health care provider will not need to use the veins in your arms for these procedures. You may have fewer limitations when using a port than you would if you used other types of long-term IVs, and you will likely be able to return to normal activities after your incision heals. An implanted port has two main parts:  Reservoir. The reservoir is the part where a needle is inserted to give medicines or draw blood. The reservoir is round. After it is placed, it appears as a small, raised area under your skin.  Catheter. The catheter is a thin, flexible tube that connects the reservoir to a vein. Medicine that is inserted into the reservoir goes into the catheter and then into the vein. How is my port accessed? To access your port:  A numbing cream may be placed on the skin over the port site.  Your health care provider will put on a mask and sterile gloves.  The skin over your port will be cleaned carefully with a germ-killing soap and allowed to dry.  Your health care provider will gently pinch the port and insert a needle into it.  Your health care provider will check for a blood return to make sure the port is in the vein and is not clogged.  If your port needs to remain accessed to get medicine continuously (constant infusion), your health care provider will place a clear bandage (dressing) over the needle site. The dressing and needle will need to be changed every week, or as told by your health care  provider. What is flushing? Flushing helps keep the port from getting clogged. Follow instructions from your health care provider about how and when to flush the port. Ports are usually flushed with saline solution or a medicine called heparin. The need for flushing will depend on how the port is used:  If the port is only used from time to time to give medicines or draw blood, the port may need to be flushed: ? Before and after medicines have been given. ? Before and after blood has been drawn. ? As part of routine maintenance. Flushing may be recommended every 4-6 weeks.  If a constant infusion is running, the port may not need to be flushed.  Throw away any syringes in a disposal container that is meant for sharp items (sharps container). You can buy a sharps container from a pharmacy, or you can make one by using an empty hard plastic bottle with a cover. How long will my port stay implanted? The port can stay in for as long as your health care provider thinks it is needed. When it is time for the port to come out, a surgery will be done to remove it. The surgery will be similar to the procedure that was done to put the port in. Follow these instructions at home:   Flush your port as told by your health care provider.  If you need an infusion over several days, follow instructions from your health care provider about how to take care of your port site. Make sure you: ? Wash your hands with soap and water before you change your dressing. If soap and water are not available, use alcohol-based hand sanitizer. ? Change your dressing as told by your health care provider. ? Place any used dressings or infusion bags into a plastic bag. Throw that bag in the trash. ? Keep the dressing that covers the needle clean and dry. Do not get it wet. ? Do not use scissors or sharp objects near the tube. ? Keep the tube clamped, unless it is being used.  Check your port site every day for signs of  infection. Check for: ? Redness, swelling, or pain. ? Fluid or blood. ? Pus or a bad smell.  Protect the skin around the port site. ? Avoid wearing bra straps that rub or irritate the site. ? Protect the skin around your port from seat belts. Place a soft pad over your chest if needed.  Bathe or shower as told by your health care provider. The site may get wet as long as you are not actively receiving an infusion.  Return to your normal activities as told by your health care provider. Ask your health care provider what activities are safe for you.  Carry a medical alert card or wear a medical alert bracelet at all times. This will let health care providers know that you have an implanted port in case of an emergency. Get help right away if:  You have redness, swelling, or pain at the port site.  You have fluid or blood coming from your port site.  You have pus or a bad smell coming from the port site.  You have a fever. Summary  Implanted ports are usually placed in the chest for long-term IV access.  Follow instructions from your health care provider about flushing the port and changing bandages (dressings).  Take care of the area around your port by avoiding clothing that puts pressure on the area, and by watching for signs of infection.  Protect the skin around your port from seat belts. Place a soft pad over your chest if needed.  Get help right away if you have a fever or you have redness, swelling, pain, drainage, or a bad smell at the port site. This information is not intended to replace advice given to you by your health care provider. Make sure you discuss any questions you have with your health care provider. Document Released: 09/17/2005 Document Revised: 01/09/2019 Document Reviewed: 10/20/2016 Elsevier Patient Education  2020 [employer].

## 2019-08-13 NOTE — Progress Notes (Signed)
Subjective:   PATIENT ID: [name] GENDER: female DOB: July 07, 1971, MRN: [id number]   HPI  Chief Complaint  Patient presents with  . Follow-up    6MW today which had to be stopped due to pt becoming very dizzy and almost passing out. Pt has had to have her O2 increased due to SOB and was placed on zpak. Pt still has complaints of cough which is worse and is coughing up dark red phlegm. Pt also has complaints of SOB. 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  On 08/13/19, prior to our visit while performing the 6 minute walk test, [name] reports coughing while walking and having dizziness and vision tunnel before falling down. Staff reported she was confused. On my arrival she was awake and distressed but answered questions appropriately. She reports that she has not eaten anything since last night. POC glucose was 109. Pulse oximetry >96% and tachycardia 110. She was given ginger ale and crackers. She was monitored in the office and her husband was called in to pick patient up. Husband remained for the appointment to discuss plan.  She reports at home she still has dyspnea on exertion and has increased her oxygen for a target SpO2 >94%. She took azithromycin as we discussed on our last visit but did not feel this provided any relief. She still has chronic cough with intermittent sputum production.  She reports recent dysphagia with pill swallowing requiring  several gulps of water before it starts going down. This has been worsening in the last month.   Social History: Has two adult daughters 5 for her 11 yeard old son with atypical cerebral palsy  Environmental exposures:  Hx chemotherapy  CareEverywhere Records Reviewed and Summarized: Pulm at [facility]      09/18/17 Bronchoscopy with BAL. Negative cultures and negative cytology.      Dr. Geraldo Docker documented on 01/31/18 that patient's chest imaging was presented in 10/2017 and her bronchial fibroelastotic scar was felt to be a nonspecific injury related to reflux or prior XRT and not related to her chronic cough. Oncology at 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  recurrence. Currently has monthly port flushes.      The note also comments on PCP work-up comments on patient reporting hematuria with UA showing 1+ blood however this is not seen on recent PCP note.  IM at [facility] -       Per recent PCP note on 03/17/19, patient previously seen by Cardiology for CHF with right heart failure and pH with EF 35% however EF normalized on echo 11/2018  I have personally reviewed patient's past medical/family/social history/allergies/current medications.  Past Medical History:  Diagnosis Date  . Addison's disease ([place])   . Adrenal insufficiency ([place])   . Anemia   . Anxiety   . Aortic stenosis   . Aortic stenosis   . Appendicitis 12/19/09  . Appendicitis   . Breast cancer (  [place])    STATUS POST BILATERAL MASTECTOMY. STATUS POST RECONSTRUCTION. SHE HAD SILICONE BREAST IMPLANTS AND THE LEFT IMPLANT IS LEAKING SLIGHTLY  . Cellulitis of right middle finger 11/07/2018  . Cervical cancer ([place]) 12/23/2018  . Chest pain   . CHF with right heart failure ([place]) 04/17/2017  . Chronic respiratory failure with hypoxia ([place]) 12/23/2018  . Cough variant asthma 04/13/2019  . Depression   . GERD (gastroesophageal reflux disease)   . Headache     migraines  . Heart murmur   . History of kidney stones   . Hodgkin lymphoma ([facility])    STATUS POST MANTLE RADIATION  . Hodgkin's lymphoma ([place])    1987  . Hypertension   . Hypoxia   . Necrotizing fasciitis ([place]) 12/23/2018  . Non-ischemic cardiomyopathy ([place])   . Osteoporosis   . Palpitations   . Pituitary adenoma ([place]) 12/23/2018  . Pneumonia   . PONV (postoperative nausea and vomiting)   . Pre-diabetes    per pt; no meds  . Pulmonary hypertension ([place]) 12/23/2018  . Raynaud phenomenon   . Right heart failure ([place]) 04/17/2017  . Supplemental oxygen dependent    3 liters  . SVT (supraventricular tachycardia) ([place])   . Tachycardia   . Thyroid cancer ([place])    STATUS POST SURGICAL REMOVAL-CURRENT ON THYROID REPLACEMENT     Family History  Family history unknown: Yes     Social History   Occupational History  . Not on file  Tobacco Use  . Smoking status: Never Smoker  . Smokeless tobacco: Never Used  Substance and Sexual Activity  . Alcohol use: Yes    Comment: social   . Drug use: No  . Sexual activity: Yes    Birth control/protection: Surgical    Allergies  Allergen Reactions  . Na Ferric Gluc Cplx In Sucrose Anaphylaxis  . Cymbalta [Duloxetine Hcl] Swelling and Anxiety  . Mushroom Extract Complex   . Succinylcholine Other (See Comments)    Lock Jaw  . Ativan [Lorazepam] Rash and Other (See Comments)    Dystonia  . Buprenorphine Hcl Hives  . Compazine Other (See Comments)    Altered mental status Aggression  . Metoclopramide Other (See Comments)    Dystonia  . Morphine And Related Hives  . Ondansetron Hives and Rash  . Promethazine Hcl Hives  . Tegaderm Ag Mesh [Silver] Rash    Old formulation only, is able to tolerate new formulation     Outpatient Medications Prior to Visit  Medication Sig Dispense Refill  . ADDERALL XR 15 MG 24 hr capsule Take 15 mg by mouth daily.    Marland Kitchen albuterol (2.5 MG/3ML) 0.083% NEBU 3 mL, albuterol (5 MG/ML) 0.5% NEBU 0.5 mL  Inhale 3 mg into the lungs every 6 (six) hours as needed (SOB).     Marland Kitchen albuterol (VENTOLIN HFA) 108 (90 Base) MCG/ACT inhaler Inhale 2 puffs into the lungs every 6 (six) hours as needed for wheezing or shortness of breath.    . Alum & Mag Hydroxide-Simeth (GI COCKTAIL) SUSP suspension Take 30 mLs daily as needed by mouth for indigestion (BLEEDING ULCERS). Shake well.    Marland Kitchen azithromycin (ZITHROMAX) 250 MG tablet Take 2 tablets today, then 1 tablet for the next 4 days 6 tablet 0  . beclomethasone (QVAR) 80 MCG/ACT inhaler Inhale 1 puff into the lungs 2 (two) times daily.     . benzonatate (TESSALON) 200 MG capsule Take 1 capsule (200 mg total) by mouth 3 (three) times daily  as needed for cough. 45 capsule 1  . clonazePAM (KLONOPIN) 1 MG tablet Take 1 mg by mouth 2 (two) times daily.     Marland Kitchen dexlansoprazole (DEXILANT) 60 MG capsule Take 60 mg by mouth daily.      . diphenhydrAMINE (BENADRYL) 50 MG/ML injection Inject 1 mL (50 mg total) into the vein every 6 (six) hours as needed (nausea). 25 mL 0  . enoxaparin (LOVENOX) 80 MG/0.8ML injection Inject 80 mg into the skin daily.    Marland Kitchen EPINEPHrine 0.3 mg/0.3 mL IJ SOAJ injection Inject 0.3 mLs (0.3 mg total) into the muscle as needed for anaphylaxis. 1 each 3  . escitalopram (LEXAPRO) 20 MG tablet Take 20 mg by mouth at bedtime.   1  . estradiol (ESTRACE) 2 MG tablet Take 2 mg by mouth at bedtime.   11  . Ferrous Sulfate Dried (SLOW RELEASE IRON) 45 MG TBCR Take 1 tablet at bedtime by mouth.    . fluticasone furoate-vilanterol (BREO ELLIPTA) 200-25 MCG/INH AEPB Inhale 1 puff into the lungs daily.    . furosemide (LASIX) 20 MG tablet Take 20 mg by mouth daily.     . Galcanezumab-gnlm 120 MG/ML SOAJ Inject 120 mg into the skin every 28 (twenty-eight) days.     . hydrocortisone (CORTEF) 10 MG tablet Take 5-10 mg by mouth See admin instructions. Take 10 mg in the am and 5 mg in the pm    . levothyroxine (SYNTHROID, LEVOTHROID) 112 MCG tablet Take 112 mcg by mouth  daily before breakfast.    . Mepolizumab (NUCALA) 100 MG SOLR Inject 100 mg into the skin every 28 (twenty-eight) days. 1 each 3  . metoprolol succinate (TOPROL-XL) 25 MG 24 hr tablet Take 25 mg by mouth daily.     . OXYGEN Inhale 3.5 L into the lungs continuous.    . Rimegepant Sulfate (NURTEC) 75 MG TBDP Nurtec ODT 75 mg disintegrating tablet    . Rivaroxaban 15 & 20 MG TBPK Follow package directions: Take one 35m tablet by mouth twice a day. On day 22, switch to one 29mtablet once a day. Take with food. 51 each 0  . rosuvastatin (CRESTOR) 10 MG tablet Take 10 mg by mouth daily.     . sucralfate (CARAFATE) 1 g tablet Take 1 g by mouth daily as needed (FOR ULCERS).     . Marland Kitchenopiramate (TOPAMAX) 100 MG tablet Take 150 mg by mouth at bedtime.     . traMADol (ULTRAM) 50 MG tablet Take 50 mg by mouth daily as needed (migraine).     . zolpidem (AMBIEN CR) 12.5 MG CR tablet Take 12.5 mg by mouth at bedtime.       No facility-administered medications prior to visit.     Review of Systems  Constitutional: Negative for chills, diaphoresis, fever, malaise/fatigue and weight loss.  HENT: Negative for congestion.   Respiratory: Positive for cough, sputum production and shortness of breath. Negative for hemoptysis and wheezing.   Cardiovascular: Negative for chest pain, palpitations and leg swelling.     Objective:   Vitals:   08/13/19 1457  BP: 116/70  Pulse: (!) 109  SpO2: 99%  Height: _0  (1.651 m)      Physical Exam: General: Well-appearing, no acute distress HENT: [place], AT Eyes: EOMI, no scleral icterus Respiratory: Clear to auscultation bilaterally.  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 No infiltrate, edema or effusion. CT 05/05/09 - Normal parenchymal. Scattered tiny pulmonary nodules with largest 98m in LUL likely benign CT Chest 09/05/17 (report only): Stable bilateral pulmonary nodules up to 451m(LUL 65m29mLUL 2 mm, RLL subpleural 4mm34mredemonstration of tiny right diaphragmatic hernia containing fat CT Chest 11/10/18 - RML 5 mm lung nodule, unchanged  PFT: 04/16/19 FVC 2.42 (65%) FEV1 2.03 (68%) Ratio 76  TLC 84% DLCO 75% Interpretation: Moderate reversible obstructive defect. Significant BD response in FEV1.  CPET Per [facility] records  CPET 03/2016 showed functional impairment with VO2max59m% predicted. Normal oxygenation throughout on room air with post-exercise PaO2=125. HR 82% predicted. Abnormal FEV1 but ventilatory reserve remained. Conclusion: Cardiovascular limitation to exercise possibly reflecting deconditioning. Ventilatory impairment present but not exercise-limiting.  6 min walk  Per [facility] records (12/07/16): SpO2 started at 97-98% and decreased to ~94% with ambulation. There was one episode of symptomatic dizziness during which SpO2 was 88% but with questionable tracing. Recovered to high-90s with ~10-15 seconds of standing rest break.  [name] 08/13/19  Unable to complete 6MWT. Patient had episode of coughing while walking and dizziness resulting in witnessed fall. Staff reported patient was disoriented and confused.  POC glucose was 109. Pulse oximetry >96% and tachycardia 110. No desaturations documented during this time.  Ambulatory O2 Titration 04/13/19 - Patient unable to complete due to dizziness with ambulation. No desaturations documented. SpO2>95% in the time titration was attempted on room air. Per nursing, patient anxious and holding breath during testing which likely contributed to early termination of test  Labs:  ANA Screen 07/03/17 - Positive  Feb 2020 ANA - neg SSA/SSB - neg Anti-DNA ab -neg MPO/PR3 ab -  ng GBM ab - neg Anti-scleroderma ab - neg APLAS - neg ANCA titers - neg RF mildly elevated 18.6  Bronchoscopy: 11/19/18   11/19/18 TBFNA station 7 (mislabeled as bronchial brushing) - negative for malignant cells  11/19/18 Respiratory/fungal, AFB from BAL of L lingula - negative  Imaging, labs and test noted above have been reviewed independently by me.    Assessment & Plan:   Discussion: 48 ye68 old female with hx of Hodgkin's, breast cancer, cervical cancer, precancerous ovarian tumor, hx of Cushing secondary to pituitary adenoma, history of thyroid cancer?, ADHD, anxiety/depression. S/p  S/p multiple chemoradiation treatments with all cancers currently in remission. [facility], subcentimeter lung nodules (stable), and chronic hypoxemia of unknown etiology.  She reports history of pulmonary fibrosis however on review of imaging no evidence of parenchymal disease on prior reports or chest imaging that I have personally reviewed in our system.  Prior work-up of hemoptysis was negative for pulmonary embolism. She recently did have an episode of hemoptysis while on anticoagulation for superficial thrombophlebitis but AC has been discontinued.  After monitoring patient after her fall, mental status resolved to baseline. Vitals rechecked and still normal range. Husband updated on events. Advised against patient driving home today by herself. We discussed that prior to administration of her Nucala, she will need a visit to determine her stability. We discussed the Nucala may potentially provide symptom relief related to her asthma.  Dizziness, near syncope, confusion - resolved Unclear cause though suspect hypoglycemia may have preceded fall. Patient reports not eating for >12 hours  prior the appointment. Glucose check was completed when patient was recovering. No further work-up required  Moderate persistent Asthma Arrange for  follow-up in 2 weeks followed by 1st Nucala injection Continue Qvar 80 mcg one puff twice a day Continue Albuterol as needed  Chronic hypoxemic respiratory failure Your goal oxygen level is greater than 88%. Try to give yourself a break on lower oxygen levels as long as it is above 88%.  Dysphagia Refer to GI for further evaluation  Bilateral upper extremities -Unclear etiology. Previously evaluated by Rheumatology. No clear autoimmune process  Orders Placed This Encounter  Procedures  . Ambulatory referral to Gastroenterology    Referral Priority:   Routine    Referral Type:   Consultation    Referral Reason:   Specialty Services Required    Number of Visits Requested:   1  . POCT Glucose (CBG)   Meds ordered this encounter  Medications  . albuterol (PROVENTIL) (2.5 MG/3ML) 0.083% nebulizer solution 2.5 mg    No follow-ups on file.  Greater than 50% of this patient 60-minute office visit was spent face-to-face in counseling with the patient/family. We discussed medical diagnosis and treatment plan as noted.  Chi Rodman Pickle, MD [facility] 08/13/2019 2:49 PM  Office Number [phone]

## 2019-08-19 DIAGNOSIS — G43009 Migraine without aura, not intractable, without status migrainosus: Secondary | ICD-10-CM | POA: Diagnosis not present

## 2019-08-25 ENCOUNTER — Telehealth: Payer: Self-pay

## 2019-08-25 NOTE — Telephone Encounter (Signed)
Nucala Order: 110m #1 Vial Order Date: 08/25/19  Expected date of arrival: 08/28/2019 [facility]

## 2019-08-29 DIAGNOSIS — R911 Solitary pulmonary nodule: Secondary | ICD-10-CM | POA: Diagnosis not present

## 2019-08-31 ENCOUNTER — Other Ambulatory Visit: Payer: Self-pay

## 2019-08-31 ENCOUNTER — Ambulatory Visit (INDEPENDENT_AMBULATORY_CARE_PROVIDER_SITE_OTHER): Payer: BC Managed Care – PPO | Admitting: Pulmonary Disease

## 2019-08-31 ENCOUNTER — Ambulatory Visit (INDEPENDENT_AMBULATORY_CARE_PROVIDER_SITE_OTHER): Payer: BC Managed Care – PPO

## 2019-08-31 ENCOUNTER — Encounter: Payer: Self-pay | Admitting: Pulmonary Disease

## 2019-08-31 VITALS — BP 118/66 | HR 89 | Temp 97.4°F | Ht 65.0 in | Wt 187.6 lb

## 2019-08-31 DIAGNOSIS — J4540 Moderate persistent asthma, uncomplicated: Secondary | ICD-10-CM | POA: Diagnosis not present

## 2019-08-31 MED ORDER — MEPOLIZUMAB 100 MG ~~LOC~~ SOLR
100.0000 mg | SUBCUTANEOUS | Status: DC
  Administered 2019-08-31: 100 mg via SUBCUTANEOUS

## 2019-08-31 NOTE — Telephone Encounter (Signed)
Nucala Shipment Received: 192m #1 vial Medication arrival date: 08/28/2019 Lot #: [id number] date: 08/2022 Received by: LAshley Akin RN, but entered into chart on 08/31/2019 by ALen Blalock CMA

## 2019-08-31 NOTE — Progress Notes (Signed)
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  Overall, she feels her asthma is not controlled. She reports she is compliant with her inhalers, Qvar 80 mcg 1 puff BID and Breo 200-25 mcg 1 puff daily. She uses her albuterol and nebulizers 3-4 times daily. She continues to have chronic cough and shortness of breath. Worsened with talking and exertion.She is compliant with her home oxygen. Today, she does feel anxious about receiving the Nucala injection. She has joined a support group online for [employer] users and expresses concerns about headache and anaphylaxis.  Social History: Has two adult daughters 56 for her 59 yeard old son with atypical cerebral palsy  Environmental exposures:  Hx chemotherapy  CareEverywhere Records Reviewed and Summarized: Pulm at [facility]      09/18/17 Bronchoscopy with BAL. Negative cultures and negative cytology.      Dr. Geraldo Docker documented on 01/31/18 that patient's chest imaging was presented in 10/2017 and her bronchial fibroelastotic scar was felt to be a nonspecific injury related to reflux or prior XRT and not related to her chronic cough. 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  recurrence. Currently has monthly  port flushes.      The note also comments on PCP work-up comments on patient reporting hematuria with UA showing 1+ blood however this is not seen on recent PCP note.  IM at [facility] -       Per recent PCP note on 03/17/19, patient previously seen by Cardiology for CHF with right heart failure and pH with EF 35% however EF normalized on echo 11/2018  I have personally reviewed patient's past medical/family/social history/allergies/current medications.  Past Medical History:  Diagnosis Date   Addison's disease ([place])    Adrenal insufficiency ([place])    Anemia    Anxiety    Aortic stenosis    Aortic stenosis    Appendicitis 12/19/09   Appendicitis    Breast cancer (HCC)    STATUS POST BILATERAL MASTECTOMY. STATUS POST RECONSTRUCTION. SHE HAD SILICONE BREAST IMPLANTS AND THE LEFT IMPLANT IS LEAKING SLIGHTLY   Cellulitis of right middle finger 11/07/2018   Cervical cancer ([place]) 12/23/2018   Chest pain    CHF with right heart failure ([place]) 04/17/2017   Chronic respiratory failure with hypoxia (HCC) 12/23/2018   Cough variant asthma 04/13/2019   Depression    GERD (gastroesophageal reflux disease)    Headache    migraines   Heart murmur    History of kidney stones    Hodgkin lymphoma ([place])    STATUS POST MANTLE RADIATION   Hodgkin's lymphoma ([place])    1987   Hypertension    Hypoxia    Necrotizing fasciitis ([place]) 12/23/2018   Non-ischemic cardiomyopathy ([place])    Osteoporosis    Palpitations  Pituitary adenoma ([place]) 12/23/2018   Pneumonia    PONV (postoperative nausea and vomiting)    Pre-diabetes    per pt; no meds   Pulmonary hypertension ([place]) 12/23/2018   Raynaud phenomenon    Right heart failure ([place]) 04/17/2017   Supplemental oxygen dependent    3 liters   SVT (supraventricular tachycardia) (HCC)    Tachycardia    Thyroid cancer ([place])    STATUS POST SURGICAL REMOVAL-CURRENT ON THYROID REPLACEMENT     Family History  Family history unknown:  Yes     Social History   Occupational History   Not on file  Tobacco Use   Smoking status: Never Smoker   Smokeless tobacco: Never Used  Substance and Sexual Activity   Alcohol use: Yes    Comment: social    Drug use: No   Sexual activity: Yes    Birth control/protection: Surgical    Allergies  Allergen Reactions   Na Ferric Gluc Cplx In Sucrose Anaphylaxis   Cymbalta [Duloxetine Hcl] Swelling and Anxiety   Mushroom Extract Complex    Succinylcholine Other (See Comments)    Lock Jaw   Ativan [Lorazepam] Rash and Other (See Comments)    Dystonia   Buprenorphine Hcl Hives   Compazine Other (See Comments)    Altered mental status Aggression   Metoclopramide Other (See Comments)    Dystonia   Morphine And Related Hives   Ondansetron Hives and Rash   Promethazine Hcl Hives   Tegaderm Ag Mesh [Silver] Rash    Old formulation only, is able to tolerate new formulation     Outpatient Medications Prior to Visit  Medication Sig Dispense Refill   ADDERALL XR 15 MG 24 hr capsule Take 15 mg by mouth daily.     albuterol (2.5 MG/3ML) 0.083% NEBU 3 mL, albuterol (5 MG/ML) 0.5% NEBU 0.5 mL Inhale 3 mg into the lungs every 6 (six) hours as needed (SOB).      albuterol (VENTOLIN HFA) 108 (90 Base) MCG/ACT inhaler Inhale 2 puffs into the lungs every 6 (six) hours as needed for wheezing or shortness of breath.     Alum & Mag Hydroxide-Simeth (GI COCKTAIL) SUSP suspension Take 30 mLs daily as needed by mouth for indigestion (BLEEDING ULCERS). Shake well.     aspirin 325 MG tablet Take 325 mg by mouth daily.     azithromycin (ZITHROMAX) 250 MG tablet Take 2 tablets today, then 1 tablet for the next 4 days 6 tablet 0   beclomethasone (QVAR) 80 MCG/ACT inhaler Inhale 1 puff into the lungs 2 (two) times daily.      benzonatate (TESSALON) 200 MG capsule Take 1 capsule (200 mg total) by mouth 3 (three) times daily as needed for cough. 45 capsule 1   clonazePAM  (KLONOPIN) 1 MG tablet Take 1 mg by mouth 2 (two) times daily.      dexlansoprazole (DEXILANT) 60 MG capsule Take 60 mg by mouth daily.       diphenhydrAMINE (BENADRYL) 50 MG/ML injection Inject 1 mL (50 mg total) into the vein every 6 (six) hours as needed (nausea). 25 mL 0   EPINEPHrine 0.3 mg/0.3 mL IJ SOAJ injection Inject 0.3 mLs (0.3 mg total) into the muscle as needed for anaphylaxis. (Patient not taking: Reported on 08/13/2019) 1 each 3   escitalopram (LEXAPRO) 20 MG tablet Take 20 mg by mouth at bedtime.   1   estradiol (ESTRACE) 2 MG tablet Take 2 mg by mouth at bedtime.  11   fluticasone furoate-vilanterol (BREO ELLIPTA) 200-25 MCG/INH AEPB Inhale 1 puff into the lungs daily.     furosemide (LASIX) 20 MG tablet Take 40 mg by mouth daily.      Galcanezumab-gnlm 120 MG/ML SOAJ Inject 120 mg into the skin every 28 (twenty-eight) days.      hydrocortisone (CORTEF) 10 MG tablet Take 5-10 mg by mouth See admin instructions. Take 10 mg in the am and 5 mg in the pm     levothyroxine (SYNTHROID, LEVOTHROID) 112 MCG tablet Take 112 mcg by mouth daily before breakfast.     Mepolizumab (NUCALA) 100 MG SOLR Inject 100 mg into the skin every 28 (twenty-eight) days. (Patient not taking: Reported on 08/13/2019) 1 each 3   metoprolol succinate (TOPROL-XL) 25 MG 24 hr tablet Take 25 mg by mouth daily.      OXYGEN Inhale 3.5 L into the lungs continuous.     Rimegepant Sulfate (NURTEC) 75 MG TBDP Nurtec ODT 75 mg disintegrating tablet     rosuvastatin (CRESTOR) 10 MG tablet Take 10 mg by mouth daily.      sucralfate (CARAFATE) 1 g tablet Take 1 g by mouth daily as needed (FOR ULCERS).      topiramate (TOPAMAX) 100 MG tablet Take 150 mg by mouth at bedtime.      traMADol (ULTRAM) 50 MG tablet Take 50 mg by mouth daily as needed (migraine).      zolpidem (AMBIEN CR) 12.5 MG CR tablet Take 12.5 mg by mouth at bedtime.       No facility-administered medications prior to visit.      Review of Systems  Constitutional: Negative for chills, diaphoresis, fever, malaise/fatigue and weight loss.  HENT: Negative for congestion.   Respiratory: Positive for cough and shortness of breath. Negative for hemoptysis, sputum production and wheezing.   Cardiovascular: Negative for chest pain, palpitations and leg swelling.  Psychiatric/Behavioral: The patient is nervous/anxious.    Objective:   There were no vitals filed for this visit.    Physical Exam: General: Well-appearing, no acute distress HENT: [place], AT Eyes: EOMI, no scleral icterus Respiratory: Clear to auscultation bilaterally.  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 No infiltrate, edema or effusion. CT 05/05/09 - Normal parenchymal. Scattered tiny pulmonary nodules with largest 6101m in LUL likely benign CT Chest 09/05/17 (report only): Stable bilateral pulmonary nodules up to 469m(LUL 101m69mLUL 2 mm, RLL subpleural 4mm30mredemonstration of tiny right diaphragmatic hernia containing fat CT Chest 11/10/18 - RML 5 mm lung nodule, unchanged  PFT: 04/16/19 FVC 2.42 (65%) FEV1 2.03 (68%) Ratio 76  TLC 84% DLCO 75% Interpretation: Moderate reversible obstructive defect. Significant BD response in FEV1.  CPET Per [facility] records  CPET 03/2016 showed functional impairment with VO2max4101m% predicted. Normal oxygenation throughout on room air with post-exercise PaO2=125. HR 82% predicted. Abnormal FEV1 but ventilatory reserve remained. Conclusion: Cardiovascular limitation to exercise possibly reflecting deconditioning. Ventilatory impairment present but not exercise-limiting.  6 min walk  Per [facility]  records (12/07/16): SpO2 started at 97-98% and decreased to ~94% with ambulation. There was one episode of symptomatic dizziness during which SpO2 was 88% but with questionable tracing. Recovered to high-90s with ~10-15 seconds  of standing rest break.  [facility] 08/13/19  Unable to complete 6MWT. Patient had episode of coughing while walking and dizziness resulting in witnessed fall. Staff reported patient was disoriented and confused.  POC glucose was 109. Pulse oximetry >96% and tachycardia 110. No desaturations documented during this time.  Ambulatory O2 Titration 04/13/19 - Patient unable to complete due to dizziness with ambulation. No desaturations documented. SpO2>95% in the time titration was attempted on room air. Per nursing, patient anxious and holding breath during testing which likely contributed to early termination of test  Labs:  ANA Screen 07/03/17 - Positive  Feb 2020 ANA - neg SSA/SSB - neg Anti-DNA ab -neg MPO/PR3 ab - ng GBM ab - neg Anti-scleroderma ab - neg APLAS - neg ANCA titers - neg RF mildly elevated 18.6  Bronchoscopy: 11/19/18   11/19/18 TBFNA station 7 (mislabeled as bronchial brushing) - negative for malignant cells  11/19/18 Respiratory/fungal, AFB from BAL of L lingula - negative  Imaging, labs and test noted above have been reviewed independently by me.    Assessment & Plan:   Discussion: 48 year old female with history of Hodgkin's, breast cancer, cervical cancer, precancerous ovarian tumor, history of Cushing secondary to pituitary adenoma, history of thyroid cancer?,  ADHD, anxiety and depression.  Status post multiple chemo radiation treatments with all cancers currently in remission.  She was previously followed at [facility] for management of cough variant asthma, subcentimeter lung nodules (stable) and chronic hypoxemia of unknown etiology.  She reports history of pulmonary fibrosis however on review of imaging no evidence of parenchymal  disease on prior reports or recent chest imaging.  She has had prior work-up for hemoptysis including bronchoscopy and negative CTA. She recently did have an episode of hemoptysis while on anticoagulation for superficial thrombophlebitis but AC has been discontinued.  Today, she is stable. Her asthma is not controlled as she requires multiple nebulizer treatments daily but she is not in active exacerbation. She expressed concern about possible herpes zoster. She has not been vaccinated but did have chickenpox as a child. We discussed delaying Nucala until she can be vaccinated however she declined. Will monitor for side effects post-injection.  Moderate persistent asthma Continue Qvar 80 mcg one puff twice a day Continue Albuterol as needed Scheduled for Nucala today  Chronic hypoxemic respiratory failure Your goal oxygen level is greater than 88%. Try to give yourself a break on lower oxygen levels as long as it is above 88%.  Dysphagia GI referral in place  Bilateral upper extremities -Unclear etiology. May be related to prior radiation? Has recently been evaluated by Rheumatology. No clear autoimmune process  No orders of the defined types were placed in this encounter.  No orders of the defined types were placed in this encounter.  Return in about 3 months (around 11/29/2019).  [place], MD [facility] 08/31/2019 8:20 AM  Office Number [phone]

## 2019-08-31 NOTE — Progress Notes (Signed)
Patient presented to the office today for first-time Nucala injection.  Primary Pulmonologist: Rodman Pickle MD Medication name: Nucala Strength: 155m/mL Site(s): L arm  Epi pen/Auvi-Q visible during appointment: Yes  Time of injection: 10:14  Patient evaluated every 15-20 minutes per protocol x2 hours.  1st check: 10:35 Evaluation: pt seemed fine by my evaluation, but felt that she may be experiencing some swelling in her hands and some localized itching at injection site.  Called Dr. ELoanne Drillingto evaluate further, offered oral OTC antihistamine to pt but she declined, stated that she did not need this at that time.  Continue monitoring as planned per Dr. ELoanne Drilling   2nd check: 10:50  Evaluation: symptoms unchanged.   3rd check: 11:05  Evaluation: symptoms unchanged.   4th check: 11:25   Evaluation:Symptoms unchanged.  5th check: 11:40  Evaluation: Symptoms unchanged.   6th check: 12:00  Evaluation: Symptoms unchanged.  7th check: 12:15  Evaluation: Symptoms unchanged.   8th check: 12:30  Evaluation: Symptoms unchanged.  Patient changed back to personal O2 system and sent home.

## 2019-08-31 NOTE — Patient Instructions (Signed)
Moderate persistent asthma Continue Qvar 80 mcg one puff twice a day Continue Albuterol as needed Scheduled for Nucala today  Chronic hypoxemic respiratory failure Your goal oxygen level is greater than 88%. Try to give yourself a break on lower oxygen levels as long as it is above 88%.  Dysphagia Gi referral in place

## 2019-09-01 ENCOUNTER — Telehealth: Payer: Self-pay | Admitting: Pulmonary Disease

## 2019-09-01 NOTE — Telephone Encounter (Signed)
Spoke with pt, received first Nucala injection in L arm yesterday morning.   Pt woke up this morning with a rash on her lower back on L side, states that the rash has "bumps and blisters" on it, compared it to poison ivy.  Denied any weeping from the blisters.  Pt also noted a sharp, "lightning-like" pain shooting down her entire L leg.  Pt was adamant that this is a different pain than what she experiences with her sciatica, mentioned this multiple times.  No rash or physical abnormalities noted on leg per pt. Pt also notes extreme fatigue.  Denies any rash or soreness at injection site, fever, increased SOB or chest tightness/pain.  Pt had taken an injection of Benadryl yesterday after her injection (prescribed by another doctor for unknown reason), and has taken Aleve, but has not used anything else to help with s/s.    Dr. Loanne Drilling please advise on recs. Thanks.

## 2019-09-01 NOTE — Telephone Encounter (Signed)
I called patient. Her rash is atypical for herpes zoster but I am concerned about a reaction related to Nucala. Will send message to Pharmacy regarding known skin reaction reports. Will also arrange patient to evaluate in clinic tomorrow.

## 2019-09-02 ENCOUNTER — Ambulatory Visit: Payer: BC Managed Care – PPO | Admitting: Pulmonary Disease

## 2019-09-02 ENCOUNTER — Telehealth: Payer: Self-pay | Admitting: Pulmonary Disease

## 2019-09-02 NOTE — Telephone Encounter (Signed)
Noted. Will route to JE as FYI.

## 2019-09-09 ENCOUNTER — Inpatient Hospital Stay: Payer: BC Managed Care – PPO | Attending: Hematology and Oncology

## 2019-09-09 ENCOUNTER — Other Ambulatory Visit: Payer: Self-pay

## 2019-09-09 DIAGNOSIS — Z452 Encounter for adjustment and management of vascular access device: Secondary | ICD-10-CM | POA: Insufficient documentation

## 2019-09-09 DIAGNOSIS — Z95828 Presence of other vascular implants and grafts: Secondary | ICD-10-CM | POA: Insufficient documentation

## 2019-09-09 DIAGNOSIS — I82811 Embolism and thrombosis of superficial veins of right lower extremities: Secondary | ICD-10-CM | POA: Insufficient documentation

## 2019-09-09 MED ORDER — SODIUM CHLORIDE 0.9% FLUSH
10.0000 mL | INTRAVENOUS | Status: DC | PRN
  Administered 2019-09-09: 15:00:00 10 mL
  Filled 2019-09-09: qty 10

## 2019-09-09 MED ORDER — HEPARIN SOD (PORK) LOCK FLUSH 100 UNIT/ML IV SOLN
500.0000 [IU] | Freq: Once | INTRAVENOUS | Status: AC | PRN
  Administered 2019-09-09: 500 [IU]
  Filled 2019-09-09: qty 5

## 2019-09-10 ENCOUNTER — Inpatient Hospital Stay: Payer: BC Managed Care – PPO

## 2019-09-16 DIAGNOSIS — M79642 Pain in left hand: Secondary | ICD-10-CM | POA: Diagnosis not present

## 2019-09-16 DIAGNOSIS — M79645 Pain in left finger(s): Secondary | ICD-10-CM | POA: Diagnosis not present

## 2019-09-28 ENCOUNTER — Telehealth: Payer: Self-pay | Admitting: Pulmonary Disease

## 2019-09-28 ENCOUNTER — Ambulatory Visit: Payer: BC Managed Care – PPO

## 2019-09-28 DIAGNOSIS — R911 Solitary pulmonary nodule: Secondary | ICD-10-CM | POA: Diagnosis not present

## 2019-09-29 ENCOUNTER — Telehealth: Payer: Self-pay | Admitting: Rheumatology

## 2019-09-29 ENCOUNTER — Telehealth: Payer: Self-pay | Admitting: Pulmonary Disease

## 2019-09-29 DIAGNOSIS — L139 Bullous disorder, unspecified: Secondary | ICD-10-CM

## 2019-09-29 NOTE — Telephone Encounter (Addendum)
Spoke with Dr. Estanislado Pandy and she would like both referrals placed urgently. Per Dr. Estanislado Pandy, dermatology referral can be placed for any dermatologist at [facility]. Both referrals have been placed.

## 2019-09-29 NOTE — Telephone Encounter (Signed)
Routing to Dr. Loanne Drilling so she can contact Dr. Amedeo Plenty. Dr. Loanne Drilling is currently rounding at the hospital but will be back in office Thursday 12/31.

## 2019-09-29 NOTE — Telephone Encounter (Signed)
LMTCB x1 for pt.  

## 2019-09-29 NOTE — Telephone Encounter (Signed)
Dr. Laurelyn Sickle called stating Nareh is a mutual patient and "wanted to talk to you about a couple of things."  Dr. Vanetta Shawl cell [phone]

## 2019-09-29 NOTE — Telephone Encounter (Signed)
I returned Dr. Vanetta Shawl call.  We had detailed discussion regarding [name].  According to Dr. Amedeo Plenty she continues to have severe bullous lesions on her extremities.  She had a evaluation by Dr. 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  On her autoimmune work-up was negative in the past.  After detailed discussion due to complex nature of her disease we decided that she would benefit from a second opinion from [facility] dermatology (Dr. Sharol Roussel) and rheumatology.  Please make the referrals to the dermatology and rheumatology at [facility].

## 2019-09-29 NOTE — Telephone Encounter (Signed)
I have called Dr. Amedeo Plenty, orthopedic surgeon regarding [name], who is a shared patient. She has previously seen and is currently being evaluated by Dr. Amedeo Plenty for ulcerations on her hands. We discussed if these is possibly a reaction to Nucala which in my opinion is unlikely given the time frame of her last injection (1 month ago). She has had a prior history of these skin lesions requiring finger amputation before ever receiving Nucala. He has discussed his concerns with her Rheumatologist, Dr. Estanislado Pandy who plans on referring patient for second opinion at [facility] with Dermatology and Rheumatology. I agree with this plan.  Of note, on her initial injection she reportedly had a reaction that cleared up after self-injecting Benadryl (obtained from another physician). At the time, I told her we could discontinue treatment however she expressed wish to continue.  I do believe Nucala will provide benefit for her severe eosinophilic asthma so if she wishes to continue, we will offer it but if again has another reaction, we will discontinue the medication. If she develops any skin reactions or rashes, please have patient immediately take pictures of reaction for documentation.  Rodman Pickle, M.D. [facility] Pulmonary/Critical Care Medicine 09/29/2019 3:54 PM

## 2019-09-29 NOTE — Telephone Encounter (Signed)
ATC pt, no answer. Left message for pt to call back.

## 2019-09-30 DIAGNOSIS — M79645 Pain in left finger(s): Secondary | ICD-10-CM | POA: Diagnosis not present

## 2019-09-30 NOTE — Telephone Encounter (Signed)
Patient is returning phone call.  Patient phone number is [phone].

## 2019-10-05 DIAGNOSIS — L139 Bullous disorder, unspecified: Secondary | ICD-10-CM | POA: Diagnosis not present

## 2019-10-05 NOTE — Telephone Encounter (Signed)
LMTCB x1 for pt.

## 2019-10-06 NOTE — Telephone Encounter (Signed)
ATC pt, no answer. Left message for pt to call back.

## 2019-10-07 NOTE — Telephone Encounter (Signed)
Last nucala injection given 08/31/2019, with documentation of side effects noted day of injection, day after injection, and in 09/29/2019 phone note.  Spoke with Dr. Loanne Drilling who advised that she is ok with pt taking a second injection, but wishes pt to stay for 1 hour of monitoring after injection because of her hx with side effects to this medication.  lmtcb X3 for pt to relay this information.  Pt is already scheduled for injection on 10/12/2019, but needs to be informed that she will have to wait X1 hour after injection for monitoring.  This can be relayed to pt if/when she calls back.  Thanks.

## 2019-10-08 ENCOUNTER — Telehealth: Payer: Self-pay | Admitting: Hematology and Oncology

## 2019-10-08 ENCOUNTER — Telehealth: Payer: Self-pay | Admitting: Oncology

## 2019-10-08 ENCOUNTER — Inpatient Hospital Stay: Payer: BC Managed Care – PPO | Attending: Hematology and Oncology

## 2019-10-08 DIAGNOSIS — Z8571 Personal history of Hodgkin lymphoma: Secondary | ICD-10-CM | POA: Insufficient documentation

## 2019-10-08 DIAGNOSIS — Z85850 Personal history of malignant neoplasm of thyroid: Secondary | ICD-10-CM | POA: Insufficient documentation

## 2019-10-08 DIAGNOSIS — Z9013 Acquired absence of bilateral breasts and nipples: Secondary | ICD-10-CM | POA: Insufficient documentation

## 2019-10-08 DIAGNOSIS — Z79899 Other long term (current) drug therapy: Secondary | ICD-10-CM | POA: Insufficient documentation

## 2019-10-08 DIAGNOSIS — I82811 Embolism and thrombosis of superficial veins of right lower extremities: Secondary | ICD-10-CM | POA: Insufficient documentation

## 2019-10-08 DIAGNOSIS — Z8541 Personal history of malignant neoplasm of cervix uteri: Secondary | ICD-10-CM | POA: Insufficient documentation

## 2019-10-08 DIAGNOSIS — Z86000 Personal history of in-situ neoplasm of breast: Secondary | ICD-10-CM | POA: Insufficient documentation

## 2019-10-08 DIAGNOSIS — Z7982 Long term (current) use of aspirin: Secondary | ICD-10-CM | POA: Insufficient documentation

## 2019-10-08 DIAGNOSIS — Z452 Encounter for adjustment and management of vascular access device: Secondary | ICD-10-CM | POA: Insufficient documentation

## 2019-10-08 NOTE — Telephone Encounter (Signed)
Patient called regarding voicemail that was left, rescheduled from 01/07 to 01/11.

## 2019-10-08 NOTE — Telephone Encounter (Signed)
Returned patient's phone call regarding rescheduling an appointment, left a voicemail.

## 2019-10-12 ENCOUNTER — Other Ambulatory Visit: Payer: Self-pay

## 2019-10-12 ENCOUNTER — Ambulatory Visit (INDEPENDENT_AMBULATORY_CARE_PROVIDER_SITE_OTHER): Payer: BC Managed Care – PPO

## 2019-10-12 ENCOUNTER — Inpatient Hospital Stay: Payer: BC Managed Care – PPO

## 2019-10-12 DIAGNOSIS — Z8541 Personal history of malignant neoplasm of cervix uteri: Secondary | ICD-10-CM | POA: Diagnosis not present

## 2019-10-12 DIAGNOSIS — Z85850 Personal history of malignant neoplasm of thyroid: Secondary | ICD-10-CM | POA: Diagnosis not present

## 2019-10-12 DIAGNOSIS — Z9013 Acquired absence of bilateral breasts and nipples: Secondary | ICD-10-CM | POA: Diagnosis not present

## 2019-10-12 DIAGNOSIS — Z452 Encounter for adjustment and management of vascular access device: Secondary | ICD-10-CM | POA: Diagnosis not present

## 2019-10-12 DIAGNOSIS — J4540 Moderate persistent asthma, uncomplicated: Secondary | ICD-10-CM

## 2019-10-12 DIAGNOSIS — Z8571 Personal history of Hodgkin lymphoma: Secondary | ICD-10-CM | POA: Diagnosis not present

## 2019-10-12 DIAGNOSIS — Z86000 Personal history of in-situ neoplasm of breast: Secondary | ICD-10-CM | POA: Diagnosis not present

## 2019-10-12 DIAGNOSIS — Z7982 Long term (current) use of aspirin: Secondary | ICD-10-CM | POA: Diagnosis not present

## 2019-10-12 DIAGNOSIS — Z79899 Other long term (current) drug therapy: Secondary | ICD-10-CM | POA: Diagnosis not present

## 2019-10-12 DIAGNOSIS — I82811 Embolism and thrombosis of superficial veins of right lower extremities: Secondary | ICD-10-CM | POA: Diagnosis not present

## 2019-10-12 DIAGNOSIS — Z95828 Presence of other vascular implants and grafts: Secondary | ICD-10-CM

## 2019-10-12 MED ORDER — SODIUM CHLORIDE 0.9% FLUSH
10.0000 mL | INTRAVENOUS | Status: DC | PRN
  Administered 2019-10-12: 10 mL
  Filled 2019-10-12: qty 10

## 2019-10-12 MED ORDER — HEPARIN SOD (PORK) LOCK FLUSH 100 UNIT/ML IV SOLN
500.0000 [IU] | Freq: Once | INTRAVENOUS | Status: AC | PRN
  Administered 2019-10-12: 500 [IU]
  Filled 2019-10-12: qty 5

## 2019-10-12 MED ORDER — MEPOLIZUMAB 100 MG ~~LOC~~ SOLR
100.0000 mg | Freq: Once | SUBCUTANEOUS | Status: AC
  Administered 2019-10-12: 100 mg via SUBCUTANEOUS

## 2019-10-12 NOTE — Progress Notes (Signed)
Have you been hospitalized within the last 10 days?  No Do you have a fever?  No Do you have a cough?  No Do you have a headache or sore throat? No Do you have your Epi Pen visible and is it within date?  Yes   Patient received second Nucala injection. Injection given at 1420 in right arm. Patient observed for 1 hour per Dr. Cordelia Pen request. Patient sit quietly reading in chair. Patient assessed every  15 minutes. Patient denied any side or adverse effects.  No redness or itching at injection site. Patient discharged home at 1530. Nothing further at this time.

## 2019-10-12 NOTE — Telephone Encounter (Signed)
Patient came in for Nucala injection today.  Patient was assessed for 1 hour with no complaints of side and adverse effects. Epipen was a Patient side.  Patient is aware of how and when to use her Epipen. Patient scheduled for next injection 28/2021.  Advised Patient to contact office if she has any issues.  Nothing further at this time.

## 2019-10-14 ENCOUNTER — Telehealth: Payer: Self-pay | Admitting: Pulmonary Disease

## 2019-10-14 DIAGNOSIS — L959 Vasculitis limited to the skin, unspecified: Secondary | ICD-10-CM | POA: Diagnosis not present

## 2019-10-14 DIAGNOSIS — L139 Bullous disorder, unspecified: Secondary | ICD-10-CM | POA: Diagnosis not present

## 2019-10-14 DIAGNOSIS — R609 Edema, unspecified: Secondary | ICD-10-CM | POA: Diagnosis not present

## 2019-10-14 NOTE — Telephone Encounter (Signed)
Routing to Dr. Loanne Drilling so she can contact Dr. Baxter Flattery.

## 2019-10-20 DIAGNOSIS — L139 Bullous disorder, unspecified: Secondary | ICD-10-CM | POA: Diagnosis not present

## 2019-10-22 ENCOUNTER — Telehealth: Payer: Self-pay | Admitting: *Deleted

## 2019-10-22 NOTE — Telephone Encounter (Signed)
I attempted to call Dr. Baxter Flattery today. Left voicemail office and personal cellphone number to call back.

## 2019-10-22 NOTE — Telephone Encounter (Signed)
Received call from pt stating she had to have one finger amputated and is facing the decision of having more of her fingers amputated.  Pt states she was undergoing care at [facility] for this condition and was told by the providers there that she needed to reach out to her hematologist to have a full hematology workup.  Pt seen in the past for hematology and oncology by Dr. Lindi Adie and requesting a follow up apt.  Apt scheduled and pt verbalized understanding.

## 2019-10-23 ENCOUNTER — Other Ambulatory Visit: Payer: BC Managed Care – PPO

## 2019-10-23 ENCOUNTER — Ambulatory Visit: Payer: BC Managed Care – PPO | Admitting: Hematology and Oncology

## 2019-10-23 NOTE — Assessment & Plan Note (Deleted)
Patient has a prior history of DCIS status post bilateral mastectomies and reconstruction, Hodgkin's lymphoma status post mantle radiation, thyroid cancer, pituitary adenoma, cervical cancer  Emergency room visit: 06/28/2019, 07/09/2019 diagnosed with superficial venous thrombosis initially treated with Xarelto then switched to Lovenox which was discontinued 07/16/2019 after completing 3 months of therapy.  Amputation of digits: Suspicion is that it is related to thrombosis.  We will conduct complete hypercoagulability work-up today. Based on these findings I recommended indefinite anticoagulation. If antiphospholipid body is positive then [name] would benefit from Coumadin rather than direct oral anticoagulants. I will call her with the results of these tests.

## 2019-10-23 NOTE — Telephone Encounter (Signed)
Spoke with Dr. Baxter Flattery, Rheumatology today. Discussed Mrs. Meyers's pulmonary care with him including treatment of her severe eosinophilic asthma with Nucala.   I contacted Mirranda regarding this conversation. She also reported to me that she has tolerated her Nucala injection well. She reports an hour long episode of bilateral arm tingling but no rash.  She is very pleased with her Rheumatology and Dermatology work-up and feels that she may be getting some answers for her medical condition.  Plan LR - please schedule patient for visit prior to next injection on 11/09/19 at 10 AM.  Rodman Pickle, M.D. [facility] Pulmonary/Critical Care Medicine 10/23/2019 6:13 PM

## 2019-10-25 NOTE — Progress Notes (Signed)
Patient Care Team: Toppin, Antionette Poles, MD as PCP - General (Internal Medicine) Nahser, Wonda Cheng, MD as PCP - Cardiology (Cardiology) Maylon Peppers, MD (Family Medicine)  DIAGNOSIS:    ICD-10-CM   1. 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 Hx of Hodgkins lymphoma  Z85.71 Factor 5 leiden    Prothrombin gene mutation    CT Abdomen Pelvis W Contrast    CT Chest W Contrast    NM Bone Scan Whole Body    CANCELED: Lupus anticoagulant panel    CANCELED: Factor 8 assay  3. Thyroid cancer (HCC)  C73 Factor 5 leiden    Prothrombin gene mutation    CT Abdomen Pelvis W Contrast    CT Chest W Contrast    NM Bone Scan Whole Body    CANCELED: Lupus anticoagulant panel    CANCELED: Factor 8 assay  4. Acute superficial venous thrombosis of lower extremity, right  I82.811 Factor 5 leiden    Prothrombin gene mutation    CT Abdomen Pelvis W Contrast    CT Chest W Contrast    CANCELED: Lupus anticoagulant panel    CANCELED: Factor 8 assay    SUMMARY OF ONCOLOGIC HISTORY: Oncology History  Hx of Hodgkins lymphoma   Initial Diagnosis   Hx of Hodgkins lymphoma    Radiation Therapy     Thyroid cancer (HCC)   Initial Diagnosis   Thyroid cancer (HCC)    Remission      Remission      Radiation Therapy     History of cervical cancer  12/23/2018 Initial Diagnosis   History of cervical cancer    Remission      Radiation Therapy       CHIEF COMPLIANT: Follow-up of recurrent SVT, recent finger amputation  INTERVAL HISTORY: [name] is a 49 y.o. with above-mentioned history of recurrent superficial venous thrombosis who is currently on surveillance. She recently had one finger amputated and is under care at [facility]. She was referred back to undergo a full hematology  work-up. She presents to the clinic today for follow-up.  She saw rheumatology and dermatology at [facility].  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  Rheumatology has performed extensive blood work including antiphospholipid antibody panel.  She was informed that it was negative for lupus anticoagulant.  We were asked to perform hypercoagulability panel because of her ischemic state.  ALLERGIES:  is allergic to na ferric gluc cplx in sucrose; cymbalta [duloxetine hcl]; mushroom extract complex; succinylcholine; ativan [lorazepam]; buprenorphine hcl; compazine; metoclopramide; morphine and related; ondansetron; promethazine hcl; and tegaderm ag mesh [silver].  MEDICATIONS:  Current Outpatient Medications  Medication Sig Dispense Refill  . ADDERALL XR 15 MG 24 hr capsule Take 15 mg by mouth daily.    Marland Kitchen albuterol (2.5 MG/3ML) 0.083% NEBU 3 mL, albuterol (5 MG/ML) 0.5% NEBU 0.5 mL Inhale 3 mg into the lungs every 6 (six) hours as needed (SOB).     Marland Kitchen albuterol (VENTOLIN HFA) 108 (90 Base) MCG/ACT inhaler Inhale 2 puffs into the lungs every 6 (six) hours as needed for wheezing or shortness of breath.    . Alum & Mag Hydroxide-Simeth (  GI COCKTAIL) SUSP suspension Take 30 mLs daily as needed by mouth for indigestion (BLEEDING ULCERS). Shake well.    . aspirin 325 MG tablet Take 325 mg by mouth daily.    . beclomethasone (QVAR) 80 MCG/ACT inhaler Inhale 1 puff into the lungs 2 (two) times daily.     . benzonatate (TESSALON) 200 MG capsule Take 1 capsule (200 mg total) by mouth 3 (three) times daily as needed for cough. 45 capsule 1  . clonazePAM (KLONOPIN) 1 MG tablet Take 1 mg by mouth 2 (two) times daily.     Marland Kitchen dexlansoprazole (DEXILANT) 60 MG capsule Take 60 mg by mouth daily.      . diphenhydrAMINE (BENADRYL) 50 MG/ML injection Inject 1 mL (50 mg total) into the vein every 6 (six) hours as needed (nausea). 25 mL 0  . EPINEPHrine 0.3 mg/0.3 mL IJ SOAJ  injection Inject 0.3 mLs (0.3 mg total) into the muscle as needed for anaphylaxis. 1 each 3  . escitalopram (LEXAPRO) 20 MG tablet Take 20 mg by mouth at bedtime.   1  . estradiol (ESTRACE) 2 MG tablet Take 2 mg by mouth at bedtime.   11  . fluticasone furoate-vilanterol (BREO ELLIPTA) 200-25 MCG/INH AEPB Inhale 1 puff into the lungs daily.    . furosemide (LASIX) 20 MG tablet Take 40 mg by mouth daily.     . Galcanezumab-gnlm 120 MG/ML SOAJ Inject 120 mg into the skin every 28 (twenty-eight) days.     . hydrocortisone (CORTEF) 10 MG tablet Take 5-10 mg by mouth See admin instructions. Take 10 mg in the am and 5 mg in the pm    . levothyroxine (SYNTHROID, LEVOTHROID) 112 MCG tablet Take 112 mcg by mouth daily before breakfast.    . Mepolizumab (NUCALA) 100 MG SOLR Inject 100 mg into the skin every 28 (twenty-eight) days. 1 each 3  . metoprolol succinate (TOPROL-XL) 25 MG 24 hr tablet Take 25 mg by mouth daily.     . OXYGEN Inhale 3.5 L into the lungs continuous.    . Rimegepant Sulfate (NURTEC) 75 MG TBDP Nurtec ODT 75 mg disintegrating tablet    . rosuvastatin (CRESTOR) 10 MG tablet Take 10 mg by mouth daily.     . sucralfate (CARAFATE) 1 g tablet Take 1 g by mouth daily as needed (FOR ULCERS).     Marland Kitchen topiramate (TOPAMAX) 100 MG tablet Take 150 mg by mouth at bedtime.     . traMADol (ULTRAM) 50 MG tablet Take 50 mg by mouth daily as needed (migraine).     . zolpidem (AMBIEN CR) 12.5 MG CR tablet Take 12.5 mg by mouth at bedtime.       Current Facility-Administered Medications  Medication Dose Route Frequency Provider Last Rate Last Admin  . Mepolizumab SOLR 100 mg  100 mg Subcutaneous Q28 days Margaretha Seeds, MD   100 mg at 08/31/19 1017   Facility-Administered Medications Ordered in Other Visits  Medication Dose Route Frequency Provider Last Rate Last Admin  . sodium chloride flush (NS) 0.9 % injection 10 mL  10 mL Intracatheter PRN Nicholas Lose, MD   10 mL at 10/26/19 1401     PHYSICAL EXAMINATION: ECOG PERFORMANCE STATUS: 1 - Symptomatic but completely ambulatory  Vitals:   10/26/19 1240  BP: 115/82  Pulse: 95  Resp: 20  Temp: 98 F (36.7 C)  SpO2: 100%   Filed Weights    LABORATORY DATA:  I have reviewed the data as listed CMP  Latest Ref Rng & Units 07/09/2019 06/29/2019 01/30/2019  Glucose 70 - 99 mg/dL 118(H) 107(H) 108(H)  BUN 6 - 20 mg/dL 11 14 -  Creatinine 0.44 - 1.00 mg/dL 0.96 1.10(H) -  Sodium 135 - 145 mmol/L 138 138 139  Potassium 3.5 - 5.1 mmol/L 3.9 3.9 3.3(L)  Chloride 98 - 111 mmol/L 107 105 -  CO2 22 - 32 mmol/L 21(L) 22 -  Calcium 8.9 - 10.3 mg/dL 8.8(L) 9.0 -  Total Protein 6.5 - 8.1 g/dL 7.4 - -  Total Bilirubin 0.3 - 1.2 mg/dL 0.2(L) - -  Alkaline Phos 38 - 126 U/L 55 - -  AST 15 - 41 U/L 41 - -  ALT 0 - 44 U/L 63(H) - -    Lab Results  Component Value Date   WBC 7.1 07/09/2019   HGB 12.4 07/09/2019   HCT 39.7 07/09/2019   MCV 93.6 07/09/2019   PLT 318 07/09/2019   NEUTROABS 4.1 07/09/2019    ASSESSMENT & PLAN:  Acute superficial venous thrombosis of lower extremity, right Patient has a prior history of DCIS status post bilateral mastectomies and reconstruction, Hodgkin's lymphoma status post mantle radiation, thyroid cancer, pituitary adenoma, cervical cancer  Emergency room visit: 06/28/2019, 07/09/2019 diagnosed with superficial venous thrombosis initially treated with Xarelto then switched to Lovenox completed 07/16/2019  Fingers amputation: Patient is having concerns with thromboembolic issues versus vasculitis. I strongly suspect that this is vasculitis rather than thromboembolism.  The area of darkened area skin is not at the tips of her digits but rather on the dorsum of her hand.  She is seeing a rheumatologist at [facility] and I instructed her that she needs to follow-up with them for vasculitis diagnosis and evaluation.  Plan: 1.  We can perform hypercoagulability work-up today but I do not  believe that it is going to change her management plan. 2.  She is currently on aspirin therapy.   History of lymphoma, thyroid cancer, DCIS, cervical cancer: We will obtain CT chest abdomen pelvis for restaging.  She has been doing this at [facility] but she would like [place] to take over scanning and annual follow-ups.  I will call the patient with the results of blood work and I will perform my chart virtual visit to go over the results of the scans.    Orders Placed This Encounter  Procedures  . CT Abdomen Pelvis W Contrast    Standing Status:   Future    Standing Expiration Date:   10/25/2020    Order Specific Question:   ** REASON FOR EXAM (FREE TEXT)    Answer:   Breast cancer, cervival cancer, thyroid cancer, melanoma restaging Prior mantle radiation    Order Specific Question:   If indicated for the ordered procedure, I authorize the administration of contrast media per Radiology protocol    Answer:   Yes    Order Specific Question:   Is patient pregnant?    Answer:   No    Order Specific Question:   Preferred imaging location?    Answer:   [facility]    Order Specific Question:   Release to patient    Answer:   Immediate    Order Specific Question:   Is Oral Contrast requested for this exam?    Answer:   Yes, Per Radiology protocol    Order Specific Question:   Radiology Contrast Protocol - do NOT remove file path    Answer:   \\[email or web address]\CTProtocols.pdf  .  CT Chest W Contrast    Standing Status:   Future    Standing Expiration Date:   10/25/2020    Order Specific Question:   ** REASON FOR EXAM (FREE TEXT)    Answer:   Restaging of multiple cancers    Order Specific Question:   If indicated for the ordered procedure, I authorize the administration of contrast media per Radiology protocol    Answer:   Yes    Order Specific Question:   Is patient pregnant?    Answer:   No    Order Specific Question:   Preferred imaging location?    Answer:    [facility]    Order Specific Question:   Radiology Contrast Protocol - do NOT remove file path    Answer:   \\[email or web address]\Radiant\CTProtocols.pdf  . NM Bone Scan Whole Body    Standing Status:   Future    Standing Expiration Date:   10/25/2020    Order Specific Question:   ** REASON FOR EXAM (FREE TEXT)    Answer:   Multiple cancers with bone pain    Order Specific Question:   If indicated for the ordered procedure, I authorize the administration of a radiopharmaceutical per Radiology protocol    Answer:   Yes    Order Specific Question:   Is the patient pregnant?    Answer:   No    Order Specific Question:   Preferred imaging location?    Answer:   [facility]    Order Specific Question:   Radiology Contrast Protocol - do NOT remove file path    Answer:   \\[email or web address]\Radiant\NMPROTOCOLS.pdf  . Factor 5 leiden    Standing Status:   Future    Number of Occurrences:   1    Standing Expiration Date:   11/29/2020  . Prothrombin gene mutation    Standing Status:   Future    Number of Occurrences:   1    Standing Expiration Date:   11/29/2020   The patient has a good understanding of the overall plan. she agrees with it. she will call with any problems that may develop before the next visit here.  Total time spent: 30 mins including face to face time and time spent for planning, charting and coordination of care  Nicholas Lose, MD 10/26/2019  I, Cloyde Reams Dorshimer, am acting as scribe for Dr. Nicholas Lose.  I have reviewed the above documentation for accuracy and completeness, and I agree with the above.

## 2019-10-26 ENCOUNTER — Inpatient Hospital Stay: Payer: BC Managed Care – PPO

## 2019-10-26 ENCOUNTER — Inpatient Hospital Stay (HOSPITAL_BASED_OUTPATIENT_CLINIC_OR_DEPARTMENT_OTHER): Payer: BC Managed Care – PPO | Admitting: Hematology and Oncology

## 2019-10-26 ENCOUNTER — Other Ambulatory Visit: Payer: Self-pay

## 2019-10-26 VITALS — BP 115/82 | HR 95 | Temp 98.0°F | Resp 20 | Ht 65.0 in

## 2019-10-26 DIAGNOSIS — I82811 Embolism and thrombosis of superficial veins of right lower extremities: Secondary | ICD-10-CM

## 2019-10-26 DIAGNOSIS — Z86000 Personal history of in-situ neoplasm of breast: Secondary | ICD-10-CM

## 2019-10-26 DIAGNOSIS — Z95828 Presence of other vascular implants and grafts: Secondary | ICD-10-CM

## 2019-10-26 DIAGNOSIS — Z8571 Personal history of Hodgkin lymphoma: Secondary | ICD-10-CM

## 2019-10-26 DIAGNOSIS — Z452 Encounter for adjustment and management of vascular access device: Secondary | ICD-10-CM | POA: Diagnosis not present

## 2019-10-26 DIAGNOSIS — Z8541 Personal history of malignant neoplasm of cervix uteri: Secondary | ICD-10-CM | POA: Diagnosis not present

## 2019-10-26 DIAGNOSIS — Z7982 Long term (current) use of aspirin: Secondary | ICD-10-CM | POA: Diagnosis not present

## 2019-10-26 DIAGNOSIS — Z85850 Personal history of malignant neoplasm of thyroid: Secondary | ICD-10-CM | POA: Diagnosis not present

## 2019-10-26 DIAGNOSIS — Z79899 Other long term (current) drug therapy: Secondary | ICD-10-CM | POA: Diagnosis not present

## 2019-10-26 DIAGNOSIS — C73 Malignant neoplasm of thyroid gland: Secondary | ICD-10-CM | POA: Diagnosis not present

## 2019-10-26 DIAGNOSIS — Z9013 Acquired absence of bilateral breasts and nipples: Secondary | ICD-10-CM | POA: Diagnosis not present

## 2019-10-26 MED ORDER — SODIUM CHLORIDE 0.9% FLUSH
10.0000 mL | INTRAVENOUS | Status: DC | PRN
  Administered 2019-10-26: 10 mL
  Filled 2019-10-26: qty 10

## 2019-10-26 MED ORDER — HEPARIN SOD (PORK) LOCK FLUSH 100 UNIT/ML IV SOLN
500.0000 [IU] | Freq: Once | INTRAVENOUS | Status: AC | PRN
  Administered 2019-10-26: 500 [IU]
  Filled 2019-10-26: qty 5

## 2019-10-26 NOTE — Assessment & Plan Note (Signed)
Patient has a prior history of DCIS status post bilateral mastectomies and reconstruction, Hodgkin's lymphoma status post mantle radiation, thyroid cancer, pituitary adenoma, cervical cancer  Emergency room visit: 06/28/2019, 07/09/2019 diagnosed with superficial venous thrombosis initially treated with Xarelto then switched to Lovenox completed 07/16/2019  Fingers amputation: Patient is having concerns with thromboembolic issues. Plan: 1. Complete hypercoagulability work-up 2. Based on these findings she may benefit from long-term indefinite anticoagulation  Return to clinic in 1 week to review the labs. We will do a MyChart virtual video visit

## 2019-10-26 NOTE — Telephone Encounter (Signed)
ATC patient unable to reach LM to call back office (x1)  

## 2019-10-27 ENCOUNTER — Telehealth: Payer: Self-pay | Admitting: Hematology and Oncology

## 2019-10-27 NOTE — Telephone Encounter (Signed)
Called and spoke with the pt and scheduled appt for 11/09/2019 at 9:30 am

## 2019-10-27 NOTE — Telephone Encounter (Signed)
Scheduled per 1/25 los. Called and spoke with pt,confimred 2/18 appt

## 2019-10-28 LAB — PROTHROMBIN GENE MUTATION

## 2019-10-29 DIAGNOSIS — R911 Solitary pulmonary nodule: Secondary | ICD-10-CM | POA: Diagnosis not present

## 2019-10-29 LAB — FACTOR 5 LEIDEN

## 2019-11-02 ENCOUNTER — Telehealth: Payer: Self-pay | Admitting: Pulmonary Disease

## 2019-11-02 NOTE — Telephone Encounter (Signed)
Called [facility] to set up [place] shipment for Patient. [facility] can not send Nucala, due to Patient has an increased co pay of $200.  [facility] is to contact Patient about co pay. Will follow up.

## 2019-11-04 NOTE — Telephone Encounter (Signed)
Nucala Order: 186m #1 Vial Order Date: 11/04/19 Expected date of arrival: 11/06/19 Ordered by: [place] [facility]

## 2019-11-05 ENCOUNTER — Telehealth: Payer: Self-pay | Admitting: Hematology and Oncology

## 2019-11-05 NOTE — Telephone Encounter (Signed)
Scheduled appt per 2/4 sch message - unable to reach pt . Left message with appt date and time

## 2019-11-06 ENCOUNTER — Other Ambulatory Visit: Payer: Self-pay

## 2019-11-06 ENCOUNTER — Inpatient Hospital Stay: Payer: BC Managed Care – PPO | Attending: Hematology and Oncology

## 2019-11-06 DIAGNOSIS — Z95828 Presence of other vascular implants and grafts: Secondary | ICD-10-CM

## 2019-11-06 DIAGNOSIS — Z452 Encounter for adjustment and management of vascular access device: Secondary | ICD-10-CM | POA: Insufficient documentation

## 2019-11-06 DIAGNOSIS — I82811 Embolism and thrombosis of superficial veins of right lower extremities: Secondary | ICD-10-CM | POA: Diagnosis not present

## 2019-11-06 MED ORDER — HEPARIN SOD (PORK) LOCK FLUSH 100 UNIT/ML IV SOLN
500.0000 [IU] | Freq: Once | INTRAVENOUS | Status: AC | PRN
  Administered 2019-11-06: 15:00:00 500 [IU]
  Filled 2019-11-06: qty 5

## 2019-11-06 MED ORDER — SODIUM CHLORIDE 0.9% FLUSH
10.0000 mL | INTRAVENOUS | Status: DC | PRN
  Administered 2019-11-06: 10 mL
  Filled 2019-11-06: qty 10

## 2019-11-06 NOTE — Patient Instructions (Signed)

## 2019-11-06 NOTE — Telephone Encounter (Signed)
Received shipment from [facility] containing ONLY sterile water. Called [facility], spoke with Tameka. Tameka spoke with someone to let them know sterile water was shipped to office, and no medication. Tameka stated Nucala will be shipped to office 11/09/19, Monday morning before 1100 injection appointment. Patient injection is schedule for 1100, 11/09/19.

## 2019-11-09 ENCOUNTER — Ambulatory Visit: Payer: BC Managed Care – PPO | Admitting: Pulmonary Disease

## 2019-11-09 ENCOUNTER — Ambulatory Visit: Payer: BC Managed Care – PPO

## 2019-11-09 NOTE — Telephone Encounter (Signed)
Nucala Shipment Received: 125m #1 vial Medication arrival date: 11/09/19 Lot #: [id number] Exp date: 01/29/2021 Received by: LElliot Dally

## 2019-11-11 ENCOUNTER — Ambulatory Visit: Payer: BC Managed Care – PPO

## 2019-11-11 ENCOUNTER — Other Ambulatory Visit: Payer: Self-pay | Admitting: *Deleted

## 2019-11-11 ENCOUNTER — Ambulatory Visit: Payer: BC Managed Care – PPO | Admitting: Pulmonary Disease

## 2019-11-11 DIAGNOSIS — Z86000 Personal history of in-situ neoplasm of breast: Secondary | ICD-10-CM

## 2019-11-12 ENCOUNTER — Inpatient Hospital Stay: Payer: BC Managed Care – PPO

## 2019-11-12 ENCOUNTER — Other Ambulatory Visit: Payer: BC Managed Care – PPO

## 2019-11-12 ENCOUNTER — Telehealth: Payer: Self-pay | Admitting: Hematology and Oncology

## 2019-11-12 NOTE — Telephone Encounter (Signed)
Returned patient's phone call regarding rescheduling 02/11 appointment, per patient's request appointment time has been rescheduled.

## 2019-11-13 ENCOUNTER — Ambulatory Visit (HOSPITAL_COMMUNITY): Admission: RE | Admit: 2019-11-13 | Payer: BC Managed Care – PPO | Source: Ambulatory Visit

## 2019-11-13 ENCOUNTER — Encounter (HOSPITAL_COMMUNITY): Payer: BC Managed Care – PPO

## 2019-11-16 ENCOUNTER — Inpatient Hospital Stay: Payer: BC Managed Care – PPO

## 2019-11-16 ENCOUNTER — Other Ambulatory Visit: Payer: Self-pay

## 2019-11-16 ENCOUNTER — Other Ambulatory Visit: Payer: Self-pay | Admitting: *Deleted

## 2019-11-16 ENCOUNTER — Telehealth: Payer: Self-pay | Admitting: *Deleted

## 2019-11-16 ENCOUNTER — Inpatient Hospital Stay (HOSPITAL_BASED_OUTPATIENT_CLINIC_OR_DEPARTMENT_OTHER): Payer: BC Managed Care – PPO | Admitting: Medical

## 2019-11-16 ENCOUNTER — Ambulatory Visit (HOSPITAL_COMMUNITY)
Admission: RE | Admit: 2019-11-16 | Discharge: 2019-11-16 | Disposition: A | Discharge status: Home / Self Care | Payer: BC Managed Care – PPO | Source: Ambulatory Visit | Attending: Oncology | Admitting: Oncology

## 2019-11-16 DIAGNOSIS — R110 Nausea: Secondary | ICD-10-CM

## 2019-11-16 DIAGNOSIS — I82812 Embolism and thrombosis of superficial veins of left lower extremities: Secondary | ICD-10-CM

## 2019-11-16 DIAGNOSIS — L139 Bullous disorder, unspecified: Secondary | ICD-10-CM | POA: Diagnosis not present

## 2019-11-16 DIAGNOSIS — L98491 Non-pressure chronic ulcer of skin of other sites limited to breakdown of skin: Secondary | ICD-10-CM | POA: Diagnosis not present

## 2019-11-16 DIAGNOSIS — I82811 Embolism and thrombosis of superficial veins of right lower extremities: Secondary | ICD-10-CM

## 2019-11-16 DIAGNOSIS — K120 Recurrent oral aphthae: Secondary | ICD-10-CM | POA: Diagnosis not present

## 2019-11-16 MED ORDER — LORAZEPAM 0.5 MG PO TABS: ORAL_TABLET | ORAL | 0 refills | Status: DC

## 2019-11-16 NOTE — Progress Notes (Signed)
Left upper extremity venous duplex has been completed. Preliminary results can be found in CV Proc through chart review.  Results were given to Dr. Jana Hakim.  11/16/19 1:33 PM [name] RVT

## 2019-11-16 NOTE — Telephone Encounter (Signed)
Received call from pt stating she is experiencing pain, redness, swelling, and warmth in her left inner bicep.  Per Dr. Jana Hakim, pt to receive VAS [place] left upper extremity to rule out DVT and then follow up with Sandi Mealy in [facility] after for evaluation.  Apt scheduled and pt verbalized understanding.

## 2019-11-16 NOTE — Progress Notes (Signed)
Multiple attempts to find pt by NT Lynnae Sandhoff and [employer], no answer when called.  Called pt's cell phone three times, no answer, left VM requesting pt call [facility] back to discuss results of [place] for DVT evaluation.  PA Lucianne Lei aware.  RN Merleen Nicely for [facility] [place] office also made aware.  Got in touch with patient who states she had to leave [facility] to pick up her son d/t an emergent call.  Pt attempted to call [facility] back several times but was unable to contact anyone d/t 'high call volume' per the recorded message.  Pt spoke with PA Lucianne Lei regarding results of [place] from today.

## 2019-11-16 NOTE — Progress Notes (Signed)
The patient thought that she could possibly have a recurrent blood clot.  She contacted our office today with this concern.  She was referred for a left upper extremity venous Doppler with results returning showing:   UPPER VENOUS STUDY     Indications: Pain  Risk Factors: Cancer.  Comparison Study: 11/09/2018 - Negative for DVT.   Performing Technologist: Oliver Hum RVT     Examination Guidelines: A complete evaluation includes B-mode imaging,  spectral  Doppler, color Doppler, and power Doppler as needed of all accessible  portions  of each vessel. Bilateral testing is considered an integral part of a  complete  examination. Limited examinations for reoccurring indications may be  performed  as noted.     Right Findings:  +----------+------------+---------+-----------+----------+-------+  RIGHT   CompressiblePhasicitySpontaneousPropertiesSummary  +----------+------------+---------+-----------+----------+-------+  Subclavian  Full    Yes    Yes             +----------+------------+---------+-----------+----------+-------+     Left Findings:  +----------+------------+---------+-----------+----------+-------+  LEFT   CompressiblePhasicitySpontaneousPropertiesSummary  +----------+------------+---------+-----------+----------+-------+  IJV      Full    Yes    Yes             +----------+------------+---------+-----------+----------+-------+  Subclavian  Full    Yes    Yes             +----------+------------+---------+-----------+----------+-------+  Axillary   Full    Yes    Yes             +----------+------------+---------+-----------+----------+-------+  Brachial   Full    Yes    Yes             +----------+------------+---------+-----------+----------+-------+  Radial    Full                         +----------+------------+---------+-----------+----------+-------+  Ulnar     Full                        +----------+------------+---------+-----------+----------+-------+  Cephalic   Full                        +----------+------------+---------+-----------+----------+-------+  Basilic    Full                        +----------+------------+---------+-----------+----------+-------+     Summary:    Right:  No evidence of thrombosis in the subclavian.    Left:  No evidence of deep vein thrombosis in the upper extremity. No evidence of  superficial vein thrombosis in the upper extremity.        The patient reports that she has ongoing episodes of nausea which have been evaluated without any etiology determined.  She reports that she was given Ativan previously at [facility] which worked.  Her chart had reported that she had an allergy to Ativan.  The patient states that she has been able to take sublingual Ativan without issues and would like to have this allergy removed from her chart.  She requested a prescription for Ativan to be sent to her pharmacy.  Sandi Mealy, MHS, PA-C Physician Assistant

## 2019-11-17 ENCOUNTER — Telehealth: Payer: Self-pay

## 2019-11-17 ENCOUNTER — Ambulatory Visit: Payer: BC Managed Care – PPO

## 2019-11-17 NOTE — Telephone Encounter (Signed)
Left message for patient to call back to set up labs prior to CT scan.

## 2019-11-19 ENCOUNTER — Telehealth: Payer: Self-pay | Admitting: Hematology and Oncology

## 2019-11-19 ENCOUNTER — Telehealth: Payer: BC Managed Care – PPO | Admitting: Hematology and Oncology

## 2019-11-19 NOTE — Telephone Encounter (Signed)
Per 2/15 sch msg. Left vm with appt date and time.

## 2019-11-24 NOTE — Telephone Encounter (Signed)
Please send to Dr. Loanne Drilling and see if she wants to see patient if any openings.

## 2019-11-24 NOTE — Telephone Encounter (Signed)
[name], please see pt email thanks  Dr. Loanne Drilling has been working with my case closely, as there are a lot of different specialists involved. I am really not comfortable seeing someone that has not worked with me before. Please do not be offended by that, I just have an extremely difficult history and have finally found someone after being misdiagnosed and treated incorrectly elsewhere.  I will be in that day for my injection. I sure hope Dr. Johnette Abraham and her sweet family +1 is ok.  [name],  [name] [phone]

## 2019-11-25 NOTE — Telephone Encounter (Signed)
Please reschedule patient to see me when I am available. Will likely need to contact patient directly to see which days work best. She will need a 30 minute slot.  Rodman Pickle, M.D. [facility] 11/25/2019 4:48 PM

## 2019-11-29 DIAGNOSIS — R911 Solitary pulmonary nodule: Secondary | ICD-10-CM | POA: Diagnosis not present

## 2019-11-30 ENCOUNTER — Other Ambulatory Visit: Payer: Self-pay | Admitting: *Deleted

## 2019-11-30 ENCOUNTER — Inpatient Hospital Stay: Payer: BC Managed Care – PPO

## 2019-11-30 ENCOUNTER — Inpatient Hospital Stay: Payer: BC Managed Care – PPO | Attending: Hematology and Oncology

## 2019-11-30 ENCOUNTER — Other Ambulatory Visit: Payer: Self-pay

## 2019-11-30 ENCOUNTER — Encounter: Payer: Self-pay | Admitting: Hematology and Oncology

## 2019-11-30 DIAGNOSIS — Z86000 Personal history of in-situ neoplasm of breast: Secondary | ICD-10-CM

## 2019-11-30 DIAGNOSIS — I82811 Embolism and thrombosis of superficial veins of right lower extremities: Secondary | ICD-10-CM | POA: Insufficient documentation

## 2019-11-30 DIAGNOSIS — Z95828 Presence of other vascular implants and grafts: Secondary | ICD-10-CM

## 2019-11-30 LAB — CMP (CANCER CENTER ONLY)
ALT: 36 U/L (ref 0–44)
AST: 39 U/L (ref 15–41)
Albumin: 3.7 g/dL (ref 3.5–5.0)
Alkaline Phosphatase: 60 U/L (ref 38–126)
Anion gap: 9 (ref 5–15)
BUN: 15 mg/dL (ref 6–20)
CO2: 28 mmol/L (ref 22–32)
Calcium: 8.6 mg/dL — ABNORMAL LOW (ref 8.9–10.3)
Chloride: 104 mmol/L (ref 98–111)
Creatinine: 1.06 mg/dL — ABNORMAL HIGH (ref 0.44–1.00)
GFR, Est AFR Am: >60 mL/min (ref >60)
GFR, Estimated: >60 mL/min (ref >60)
Glucose, Bld: 108 mg/dL — ABNORMAL HIGH (ref 70–99)
Potassium: 3.3 mmol/L — ABNORMAL LOW (ref 3.5–5.1)
Sodium: 141 mmol/L (ref 135–145)
Total Bilirubin: 0.3 mg/dL (ref 0.3–1.2)
Total Protein: 7.5 g/dL (ref 6.5–8.1)

## 2019-11-30 LAB — CBC WITH DIFFERENTIAL (CANCER CENTER ONLY)
Abs Immature Granulocytes: 0.03 10*3/uL (ref 0.00–0.07)
Basophils Absolute: 0.1 10*3/uL (ref 0.0–0.1)
Basophils Relative: 1 %
Eosinophils Absolute: 0.1 10*3/uL (ref 0.0–0.5)
Eosinophils Relative: 1 %
HCT: 38.8 % (ref 36.0–46.0)
Hemoglobin: 12.5 g/dL (ref 12.0–15.0)
Immature Granulocytes: 0 %
Lymphocytes Relative: 23 %
Lymphs Abs: 1.9 10*3/uL (ref 0.7–4.0)
MCH: 29.3 pg (ref 26.0–34.0)
MCHC: 32.2 g/dL (ref 30.0–36.0)
MCV: 90.9 fL (ref 80.0–100.0)
Monocytes Absolute: 0.9 10*3/uL (ref 0.1–1.0)
Monocytes Relative: 11 %
Neutro Abs: 5.3 10*3/uL (ref 1.7–7.7)
Neutrophils Relative %: 64 %
Platelet Count: 305 10*3/uL (ref 150–400)
RBC: 4.27 MIL/uL (ref 3.87–5.11)
RDW: 14.7 % (ref 11.5–15.5)
WBC Count: 8.3 10*3/uL (ref 4.0–10.5)
nRBC: 0 % (ref 0.0–0.2)

## 2019-11-30 MED ORDER — HEPARIN SOD (PORK) LOCK FLUSH 100 UNIT/ML IV SOLN
500.0000 [IU] | Freq: Once | INTRAVENOUS | Status: AC | PRN
  Administered 2019-11-30: 500 [IU]
  Filled 2019-11-30: qty 5

## 2019-11-30 MED ORDER — SODIUM CHLORIDE 0.9% FLUSH
10.0000 mL | INTRAVENOUS | Status: DC | PRN
  Administered 2019-11-30: 10 mL
  Filled 2019-11-30: qty 10

## 2019-11-30 NOTE — Progress Notes (Signed)
Patient requested to stay accessed for CT tomorrow. Biopatch and sensitive dressing applied.

## 2019-11-30 NOTE — Progress Notes (Signed)
Pt called stating she is experiencing ongoing lymphedema in bilateral arms/hands worse on the left side.  Per MD pt to be seen by the lymphedema clinic for further management and workup. Referral placed.

## 2019-12-01 ENCOUNTER — Ambulatory Visit (HOSPITAL_COMMUNITY): Admission: RE | Admit: 2019-12-01 | Payer: BC Managed Care – PPO | Source: Ambulatory Visit

## 2019-12-01 ENCOUNTER — Encounter: Payer: Self-pay | Admitting: Hematology and Oncology

## 2019-12-01 ENCOUNTER — Encounter (HOSPITAL_COMMUNITY): Payer: BC Managed Care – PPO

## 2019-12-01 ENCOUNTER — Ambulatory Visit (HOSPITAL_COMMUNITY): Payer: BC Managed Care – PPO

## 2019-12-01 ENCOUNTER — Other Ambulatory Visit: Payer: BC Managed Care – PPO

## 2019-12-02 ENCOUNTER — Ambulatory Visit: Payer: BC Managed Care – PPO | Admitting: Rehabilitation

## 2019-12-03 ENCOUNTER — Other Ambulatory Visit: Payer: Self-pay | Admitting: Medical

## 2019-12-03 ENCOUNTER — Inpatient Hospital Stay: Payer: BC Managed Care – PPO | Admitting: Hematology and Oncology

## 2019-12-03 DIAGNOSIS — R110 Nausea: Secondary | ICD-10-CM

## 2019-12-04 ENCOUNTER — Ambulatory Visit: Payer: BC Managed Care – PPO | Attending: Hematology and Oncology | Admitting: Rehabilitation

## 2019-12-04 DIAGNOSIS — M25512 Pain in left shoulder: Secondary | ICD-10-CM | POA: Insufficient documentation

## 2019-12-04 DIAGNOSIS — R293 Abnormal posture: Secondary | ICD-10-CM | POA: Insufficient documentation

## 2019-12-04 DIAGNOSIS — I972 Postmastectomy lymphedema syndrome: Secondary | ICD-10-CM | POA: Insufficient documentation

## 2019-12-04 DIAGNOSIS — M25612 Stiffness of left shoulder, not elsewhere classified: Secondary | ICD-10-CM | POA: Insufficient documentation

## 2019-12-04 DIAGNOSIS — Z86000 Personal history of in-situ neoplasm of breast: Secondary | ICD-10-CM | POA: Insufficient documentation

## 2019-12-04 DIAGNOSIS — G8929 Other chronic pain: Secondary | ICD-10-CM | POA: Insufficient documentation

## 2019-12-04 DIAGNOSIS — M25611 Stiffness of right shoulder, not elsewhere classified: Secondary | ICD-10-CM | POA: Insufficient documentation

## 2019-12-04 NOTE — Telephone Encounter (Signed)
Refill request

## 2019-12-08 ENCOUNTER — Ambulatory Visit: Payer: BC Managed Care – PPO

## 2019-12-08 ENCOUNTER — Other Ambulatory Visit: Payer: Self-pay

## 2019-12-08 DIAGNOSIS — M25512 Pain in left shoulder: Secondary | ICD-10-CM | POA: Diagnosis not present

## 2019-12-08 DIAGNOSIS — Z86000 Personal history of in-situ neoplasm of breast: Secondary | ICD-10-CM | POA: Diagnosis not present

## 2019-12-08 DIAGNOSIS — R293 Abnormal posture: Secondary | ICD-10-CM

## 2019-12-08 DIAGNOSIS — I972 Postmastectomy lymphedema syndrome: Secondary | ICD-10-CM

## 2019-12-08 DIAGNOSIS — M25612 Stiffness of left shoulder, not elsewhere classified: Secondary | ICD-10-CM

## 2019-12-08 DIAGNOSIS — G8929 Other chronic pain: Secondary | ICD-10-CM

## 2019-12-08 DIAGNOSIS — M25611 Stiffness of right shoulder, not elsewhere classified: Secondary | ICD-10-CM

## 2019-12-08 NOTE — Therapy (Signed)
[place], [place], [address] Phone: [phone]   Fax:  [phone]  Physical Therapy Evaluation  Patient Details  Name: [name] MRN: [id number] Date of Birth: Feb 06, 1971 Referring Provider (PT): Nicholas Lose MD   Encounter Date: 12/08/2019  PT End of Session - 12/08/19 1501    Visit Number  1    Number of Visits  13    Date for PT Re-Evaluation  01/12/20    PT Start Time  1105    PT Stop Time  1200    PT Time Calculation (min)  55 min    Activity Tolerance  Patient tolerated treatment well    Behavior During Therapy  [facility] for tasks assessed/performed       Past Medical History:  Diagnosis Date  . Addison's disease ([place])   . Adrenal insufficiency ([place])   . Anemia   . Anxiety   . Aortic stenosis   . Aortic stenosis   . Appendicitis 12/19/09  . Appendicitis   . Breast cancer ([place])    STATUS POST BILATERAL MASTECTOMY. STATUS POST RECONSTRUCTION. SHE HAD SILICONE BREAST IMPLANTS AND THE LEFT IMPLANT IS LEAKING SLIGHTLY  . Cellulitis of right middle finger 11/07/2018  . Cervical cancer ([place]) 12/23/2018  . Chest pain   . CHF with right heart failure ([place]) 04/17/2017  . Chronic respiratory failure with hypoxia ([place]) 12/23/2018  . Cough variant asthma 04/13/2019  . Depression   . GERD (gastroesophageal reflux disease)   . Headache    migraines  . Heart murmur   . History of kidney stones   . Hodgkin lymphoma (H[facility])    STATUS POST MANTLE RADIATION  . Hodgkin's lymphoma ([place])    1987  . Hypertension   . Hypoxia   . Necrotizing fasciitis ([place]) 12/23/2018  . Non-ischemic cardiomyopathy ([place])   . Osteoporosis   . Palpitations   . Pituitary adenoma ([place]) 12/23/2018  . Pneumonia   . PONV (postoperative nausea and vomiting)   . Pre-diabetes    per pt; no meds  . Pulmonary hypertension ([place]) 12/23/2018  . Raynaud phenomenon   . Right heart failure ([place]) 04/17/2017  . Supplemental oxygen  dependent    3 liters  . SVT (supraventricular tachycardia) ([place])   . Tachycardia   . Thyroid cancer ([place])    STATUS POST SURGICAL REMOVAL-CURRENT ON THYROID REPLACEMENT    Past Surgical History:  Procedure Laterality Date  . ABDOMINAL HYSTERECTOMY    . AMPUTATION Left 01/30/2019   Procedure: Left Index finger amputation with flap reconstruction and repair reconstruction;  Surgeon: Roseanne Kaufman, MD;  Location: [facility];  Service: Orthopedics;  Laterality: Left;  . APPENDECTOMY    . CARDIAC CATHETERIZATION  05/18/09   NORMAL CATH  . I & D EXTREMITY Left 12/23/2018   Procedure: IRRIGATION AND DEBRIDEMENT HAND / INDEX FINGER;  Surgeon: Roseanne Kaufman, MD;  Location: [facility];  Service: Orthopedics;  Laterality: Left;  [place] Kitchen MASTECTOMY    . PITUITARY SURGERY    . RIGHT/LEFT HEART CATH AND CORONARY ANGIOGRAPHY N/A 04/02/2018   Procedure: RIGHT/LEFT HEART CATH AND CORONARY ANGIOGRAPHY;  Surgeon: Burnell Blanks, MD;  Location: [facility];  Service: Cardiovascular;  Laterality: N/A;  . TOTAL THYROIDECTOMY    . VIDEO BRONCHOSCOPY Bilateral 11/14/2018   Procedure: VIDEO BRONCHOSCOPY WITHOUT FLUORO;  Surgeon: Margaretha Seeds, MD;  Location: [facility];  Service: Cardiopulmonary;  Laterality: Bilateral;  . VIDEO BRONCHOSCOPY WITH ENDOBRONCHIAL ULTRASOUND N/A 11/19/2018  Procedure: VIDEO BRONCHOSCOPY WITH ENDOBRONCHIAL ULTRASOUND;  Surgeon: Margaretha Seeds, MD;  Location[facility];  Service: Thoracic;  Laterality: N/A;    There were no vitals filed for this visit.   Subjective Assessment - 12/08/19 1116    Subjective  Pt reports that she had her first masectomy in May of 26 with 12 lymph nodes removed. She states that she was very very careful to prevent lymphedema that first time and went back to work the 2nd week with drains. Pt states that she has had cancer 6 times wiht Hodgkins lymphoma when she was 44 and has had multiple re-occurances of cancer since then. She has had mantle  radiation that has let scarring at her neck. She states that she has recently been diagnosed with Right sided heart failure. She states that she recently was with some friends and choked on some tator tots they did a CT scan and found some lung nodules. She worked with pulmonology at [facility] and went from working out 3x/week for not working out at all. She is recently on 4L of O2 all the time. She states that she now does not need it all the time after being diagnosed with eoinosphil asthma and has an injection and new nebulizers that have improved her breathing. Pt states that she noticed swelling in her L hand a few years ago and was getting bulbous nodules in her L hand and ended up getting her L distal 2nd digitis middle phalange amputated.She has compression gloves that she was given at [facility] with Dr. Amedeo Plenty.    Pertinent History  02/24/1999 12 LN removed negative first Bil masectomy, R sided heart failure, History of superficial blood clots, Vasculitis    Patient Stated Goals  I want to get this fluid out. I want my hands to be back to normal. I want to be able to wear my wedding rings.    Currently in Pain?  Yes    Pain Score  8     Pain Location  Hand    Pain Orientation  Left    Pain Descriptors / Indicators  Throbbing;Tightness    Pain Type  Chronic pain    Pain Onset  More than a month ago    Pain Frequency  Constant    Aggravating Factors   unknown    Pain Relieving Factors  nothing    Effect of Pain on Daily Activities  Pt is unable to tie her shoes         [facility] PT Assessment - 12/08/19 0001      Assessment   Medical Diagnosis  Bil Breast cancer and vasculitis    Referring Provider (PT)  Nicholas Lose MD    Hand Dominance  Right    Prior Therapy  Yes but not for swelling in the hand      Precautions   Precautions  Other (comment)   hx superficial blood clots, multiple types of cancer, vascul     Balance Screen   Has the patient fallen in the past 6 months  Yes    How  many times?  4    Has the patient had a decrease in activity level because of a fear of falling?   Yes    Is the patient reluctant to leave their home because of a fear of falling?   Yes      [place] residence    Living Arrangements  Spouse/significant other;Children  Type of [place]  One level      Prior Function   Level of Independence  Independent with basic ADLs    Vocation  On disability    Leisure  yardwork, exercising, woodworking, projects around the house      Cognition   Overall Cognitive Status  Within Functional Limits for tasks assessed      Posture/Postural Control   Posture/Postural Control  Postural limitations    Postural Limitations  Rounded Shoulders;Forward head      ROM / Strength   AROM / PROM / Strength  AROM      AROM   AROM Assessment Site  Wrist;Shoulder    Right/Left Shoulder  Right;Left    Right Shoulder Flexion  113 Degrees    Right Shoulder ABduction  81 Degrees    Left Shoulder Flexion  147 Degrees    Left Shoulder ABduction  78 Degrees    Right/Left Wrist  Left    Right Wrist Extension  47 Degrees    Right Wrist Flexion  43 Degrees    Right Wrist Radial Deviation  11 Degrees    Right Wrist Ulnar Deviation  21 Degrees    Left Wrist Extension  20 Degrees    Left Wrist Flexion  10 Degrees    Left Wrist Radial Deviation  18 Degrees    Left Wrist Ulnar Deviation  3 Degrees        LYMPHEDEMA/ONCOLOGY QUESTIONNAIRE - 12/08/19 1148      Surgeries   Mastectomy Date  02/24/99    Number Lymph Nodes Removed  12      Treatment   Active Chemotherapy Treatment  No    Past Chemotherapy Treatment  Yes    Active Radiation Treatment  No    Past Radiation Treatment  Yes    Body Site  Mantle radiation      What other symptoms do you have   Are you Having Heaviness or Tightness  Yes    Are you having Pain  Yes    Are you having pitting edema  Yes    Body  Site  dorsum of the hand    Is it Hard or Difficult finding clothes that fit  Yes    Do you have infections  Yes    Comments  3      Lymphedema Assessments   Lymphedema Assessments  Upper extremities      Right Upper Extremity Lymphedema   15 cm Proximal to Olecranon Process  34.3 cm    10 cm Proximal to Olecranon Process  32.4 cm    Olecranon Process  27.3 cm    15 cm Proximal to Ulnar Styloid Process  28.4 cm    10 cm Proximal to Ulnar Styloid Process  26.2 cm    Just Proximal to Ulnar Styloid Process  19.5 cm    Across Hand at [employer]  21.4 cm    At [place] of 2nd Digit  7.5 cm      Left Upper Extremity Lymphedema   15 cm Proximal to Olecranon Process  35 cm    10 cm Proximal to Olecranon Process  33.8 cm    Olecranon Process  27.8 cm    15 cm Proximal to Ulnar Styloid Process  28.8 cm    10 cm Proximal to Ulnar Styloid Process  26.5 cm    Just Proximal to Ulnar  Styloid Process  20.2 cm    Across Hand at [employer]  23.2 cm    At [place] of 2nd Digit  8.6 cm             Outpatient Rehab from 12/08/2019 [facility]  Lymphedema Life Impact Scale Total Score  80.88 %      Objective measurements completed on examination: See above findings.      [facility] Adult PT Treatment/Exercise - 12/08/19 0001      Exercises   Exercises  Other Exercises    Other Exercises   Demonstration and explanation of shoulder flexion, elbow flexion/extension, wrist flexoin/extension and fist pumps above the level of the heart.              PT Education - 12/08/19 1500    Education Details  Pt was educated on lymphedema vs edema. Discussed the anatomy and physiology of the lymphatic system. She was educated on exercises for the LUE to improve muscle pump activity to facilitate fluid flow including shoulder flexion, elbow flexion/extension, wrist flexion/extension and fist pump above the level of the heart 3x/day.    Person(s) Educated  Patient     Methods  Explanation;Demonstration    Comprehension  Verbalized understanding       PT Short Term Goals - 12/08/19 1511      PT SHORT TERM GOAL #1   Title  Pt will demonstrate autonomy with HEP and MLD in order to demonstrate home management.    Baseline  Pt does not have an HEP or know MLD    Time  2    Period  Weeks    Status  New    Target Date  12/29/19        PT Long Term Goals - 12/08/19 1511      PT LONG TERM GOAL #1   Title  Pt will have decreased girth measurements in the L hand and throughout UE by 2 cm or greater within 4 weeks in order to demonstrate decreased fluid load in the LUE.    Baseline  see measurements.    Time  4    Period  Weeks    Status  New    Target Date  01/12/20      PT LONG TERM GOAL #2   Title  Pt will have appropriate compression garment for the LUE to wear on a daily basis in order to manage her lymphedema.    Baseline  pt currently does not have compresison garment.    Time  4    Period  Weeks    Status  New    Target Date  01/12/20      PT LONG TERM GOAL #3   Title  Pt will demonstrate 140 degrees R shoulder flexion and 120 degrees Bil shoulder abduction to demonstrate improved functional ROM.    Baseline  R shoulder flexion: 113 Abduction: 81 L shoulder abduction: 78    Time  4    Period  Weeks    Status  New    Target Date  01/12/20      PT LONG TERM GOAL #4   Title  Pt will demonstrate 30 degrees L wrist extension, 30 degrees L wrist flexion and 10 degrees L wrist ulnar deviation to demonstrate improved functional mobility.    Baseline  L wrist extension: 20, Flexion:  10, Ulnar Deviation: 3    Time  4    Period  Weeks  Status  New    Target Date  01/12/20      PT LONG TERM GOAL #5   Title  Pt will demonstrate improve posture with reports of 20% improvement in overall pain/function in order to demonstrate an improved quality of life.    Baseline  pain 8/10    Time  4    Period  Weeks    Status  New    Target Date   01/12/20      Additional Long Term Goals   Additional Long Term Goals  Yes      PT LONG TERM GOAL #6   Title  Pt will score 65% or less on the LLIS to demonstrate improved quality of life with lymphedema.    Baseline  80.88%    Time  4    Period  Weeks    Status  New    Target Date  01/12/20             Plan - 12/08/19 1502    Clinical Impression Statement  Pt presents to physical therapy services with soft edema evident overall in BIL UE and LE. She demonstrates 4+ pitting edema in the dorsum of her L hand and increased circumferential measurements in the LUE throughout. She has been given a compresison glove that is not decreasing the size of her hand most likely due to pt has edema throughout the LUE and is congested in the proximal arm. Decreased shoulder ROM noted in the R shoulder compared to the L and BIl in shoulder abduction from functional mobility. She has decreased wrist mobility in the L over the R. Pt will benefit from skilled physical therapy services 3x/week for 4 weeks in order to decrease risk for infection and immobility potentially increasing risk for hospitalization.    Personal Factors and Comorbidities  Comorbidity 3+;Time since onset of injury/illness/exacerbation;Past/Current Experience    Comorbidities  history of Bil masectomy with reconstruction and then revision of reconstruction, 12 lymph node removal on the R, radiation therapy, vasculitis    Stability/Clinical Decision Making  Evolving/Moderate complexity    Clinical Decision Making  Moderate    Rehab Potential  Fair    PT Frequency  3x / week    PT Duration  4 weeks    PT Treatment/Interventions  Therapeutic exercise;Neuromuscular re-education;Therapeutic activities;Patient/family education;Manual techniques    PT Next Visit Plan  Begin light compression, gradually increase compression as patient is able to tolerate, work on easy AROM activities, Teach MLD minding possible flare up of CHF    PT Home  Exercise Plan  Shoulder flexion, elbow flexion/extension, wrist flexion/extension and fist pumps above the level of the heart    Recommended Other Services  TBD    Consulted and Agree with Plan of Care  Patient       Patient will benefit from skilled therapeutic intervention in order to improve the following deficits and impairments:  Increased edema, Decreased range of motion, Pain, Postural dysfunction  Visit Diagnosis: History of ductal carcinoma in situ (DCIS) of breast  Postmastectomy lymphedema  Stiffness of left shoulder, not elsewhere classified  Stiffness of right shoulder, not elsewhere classified  Chronic left shoulder pain  Abnormal posture     Problem List Patient Active Problem List   Diagnosis Date Noted  . Port-A-Cath in place 09/09/2019  . Moderate persistent asthma without complication 68/11/2120  . Acute superficial venous thrombosis of lower extremity, right 07/16/2019  . Injury of left index finger 12/24/2018  . Pituitary  adenoma ([place]) 12/23/2018  . History of cervical cancer 12/23/2018  . Chronic respiratory failure with hypoxia ([place]) 12/23/2018  . SOB (shortness of breath)   . Nephrolithiasis 11/07/2018  . Oxygen dependent 11/07/2018  . Migraine 11/07/2018  . PVC's (premature ventricular contractions) 10/27/2018  . Hyperlipidemia 03/09/2014  . Hx of Hodgkins lymphoma   . History of ductal carcinoma in situ (DCIS) of breast   . Thyroid cancer ([place])   . Adrenal insufficiency ([place])   . Hypertension   . Raynaud phenomenon   . GERD (gastroesophageal reflux disease)     Ander Purpura, PT 12/08/2019, 3:19 PM  [place], [place], [address] Phone: [phone]   Fax:  [phone]  Name: [name] MRN: [id number] Date of Birth: 1971-05-28

## 2019-12-09 ENCOUNTER — Other Ambulatory Visit: Payer: Self-pay | Admitting: Pulmonary Disease

## 2019-12-11 ENCOUNTER — Ambulatory Visit: Payer: BC Managed Care – PPO

## 2019-12-14 ENCOUNTER — Other Ambulatory Visit: Payer: Self-pay

## 2019-12-14 ENCOUNTER — Ambulatory Visit: Payer: BC Managed Care – PPO

## 2019-12-14 DIAGNOSIS — M25611 Stiffness of right shoulder, not elsewhere classified: Secondary | ICD-10-CM | POA: Diagnosis not present

## 2019-12-14 DIAGNOSIS — M25612 Stiffness of left shoulder, not elsewhere classified: Secondary | ICD-10-CM | POA: Diagnosis not present

## 2019-12-14 DIAGNOSIS — R293 Abnormal posture: Secondary | ICD-10-CM

## 2019-12-14 DIAGNOSIS — Z86000 Personal history of in-situ neoplasm of breast: Secondary | ICD-10-CM | POA: Diagnosis not present

## 2019-12-14 DIAGNOSIS — I972 Postmastectomy lymphedema syndrome: Secondary | ICD-10-CM

## 2019-12-14 DIAGNOSIS — G8929 Other chronic pain: Secondary | ICD-10-CM | POA: Diagnosis not present

## 2019-12-14 DIAGNOSIS — M25512 Pain in left shoulder: Secondary | ICD-10-CM

## 2019-12-14 NOTE — Therapy (Addendum)
[place], [place], [address] Phone: [phone]   Fax:  [phone]  Physical Therapy Treatment  Patient Details  Name: [name] MRN: [id number] Date of Birth: [birth year] Referring Provider (PT): Nicholas Lose MD   Encounter Date: 12/14/2019  PT End of Session - 12/14/19 1409    Visit Number  2    Number of Visits  13    Date for PT Re-Evaluation  01/12/20    PT Start Time  1021    PT Stop Time  1500    PT Time Calculation (min)  53 min    Activity Tolerance  Patient tolerated treatment well    Behavior During Therapy  [facility] for tasks assessed/performed       Past Medical History:  Diagnosis Date  . Addison's disease ([place])   . Adrenal insufficiency ([place])   . Anemia   . Anxiety   . Aortic stenosis   . Aortic stenosis   . Appendicitis 12/19/09  . Appendicitis   . Breast cancer ([place])    STATUS POST BILATERAL MASTECTOMY. STATUS POST RECONSTRUCTION. SHE HAD SILICONE BREAST IMPLANTS AND THE LEFT IMPLANT IS LEAKING SLIGHTLY  . Cellulitis of right middle finger 11/07/2018  . Cervical cancer ([place]) 12/23/2018  . Chest pain   . CHF with right heart failure ([place]) 04/17/2017  . Chronic respiratory failure with hypoxia ([place]) 12/23/2018  . Cough variant asthma 04/13/2019  . Depression   . GERD (gastroesophageal reflux disease)   . Headache    migraines  . Heart murmur   . History of kidney stones   . Hodgkin lymphoma (H[facility])    STATUS POST MANTLE RADIATION  . Hodgkin's lymphoma ([place])    1987  . Hypertension   . Hypoxia   . Necrotizing fasciitis ([place]) 12/23/2018  . Non-ischemic cardiomyopathy ([place])   . Osteoporosis   . Palpitations   . Pituitary adenoma ([place]) 12/23/2018  . Pneumonia   . PONV (postoperative nausea and vomiting)   . Pre-diabetes    per pt; no meds  . Pulmonary hypertension ([place]) 12/23/2018  . Raynaud phenomenon   . Right heart failure ([place]) 04/17/2017  . Supplemental oxygen  dependent    3 liters  . SVT (supraventricular tachycardia) ([place])   . Tachycardia   . Thyroid cancer ([place])    STATUS POST SURGICAL REMOVAL-CURRENT ON THYROID REPLACEMENT    Past Surgical History:  Procedure Laterality Date  . ABDOMINAL HYSTERECTOMY    . AMPUTATION Left 01/30/2019   Procedure: Left Index finger amputation with flap reconstruction and repair reconstruction;  Surgeon: Roseanne Kaufman, MD;  Location: [facility];  Service: Orthopedics;  Laterality: Left;  . APPENDECTOMY    . CARDIAC CATHETERIZATION  05/18/09   NORMAL CATH  . I & D EXTREMITY Left 12/23/2018   Procedure: IRRIGATION AND DEBRIDEMENT HAND / INDEX FINGER;  Surgeon: Roseanne Kaufman, MD;  Location: [facility];  Service: Orthopedics;  Laterality: Left;  Marland Kitchen MASTECTOMY    . PITUITARY SURGERY    . RIGHT/LEFT HEART CATH AND CORONARY ANGIOGRAPHY N/A 04/02/2018   Procedure: RIGHT/LEFT HEART CATH AND CORONARY ANGIOGRAPHY;  Surgeon: Burnell Blanks, MD;  Location: [facility];  Service: Cardiovascular;  Laterality: N/A;  . TOTAL THYROIDECTOMY    . VIDEO BRONCHOSCOPY Bilateral 11/14/2018   Procedure: VIDEO BRONCHOSCOPY WITHOUT FLUORO;  Surgeon: Margaretha Seeds, MD;  Location: [facility];  Service: Cardiopulmonary;  Laterality: Bilateral;  . VIDEO BRONCHOSCOPY WITH ENDOBRONCHIAL ULTRASOUND N/A 11/19/2018  Procedure: VIDEO BRONCHOSCOPY WITH ENDOBRONCHIAL ULTRASOUND;  Surgeon: Margaretha Seeds, MD;  Location: [facility];  Service: Thoracic;  Laterality: N/A;    There were no vitals filed for this visit.  Subjective Assessment - 12/14/19 1410    Pertinent History  02/24/1999 12 LN removed negative first Bil masectomy, R sided heart failure, History of superficial blood clots, Vasculitis    Patient Stated Goals  I want to get this fluid out. I want my hands to be back to normal. I want to be able to wear my wedding rings.    Currently in Pain?  Yes    Pain Score  8     Pain Location  Hand    Pain Orientation  Left    Pain  Descriptors / Indicators  Throbbing;Tightness    Pain Onset  More than a month ago    Pain Frequency  Constant    Aggravating Factors   unknown    Pain Relieving Factors  nothing    Effect of Pain on Daily Activities  pt is unable to tie her shoes                  Outpatient Re[facility]08/[facility]  Lymphedema Life Impact Scale Total Score  80.88 %           [facility] Adult PT Treatment/Exercise - 12/14/19 0001      Manual Therapy   Manual Therapy  Manual Lymphatic Drainage (MLD);Edema management    Manual therapy comments  TG soft Medium was used at the LUE with a shorter piece of SM over the hand/wrist as well with tape to prevent rolling once folded over to prevent tourniquet effect.     Edema Management  see Manual therapy comments    Manual Lymphatic Drainage (MLD)  pt was taught self MLD with 5 diaphragmatic breathes, L axillary and inguinal nodes, working the L axillo-inguinal anastomosis with 4 sweeps then 5 pumps, modified due to pt states that she is unable to reach across her body with her RUE due to [facility] issues anterior antebrachium, posterior antebrachium, dorsum of the hand and fingers re-working all surfaces (pt encouraged to re-work as high as she is able onto her brachium) then physical therapist performed MLD in supine: short neck, swimming in the terminus, bil shoulders, L axillary and inguinal nodes, L axillo-inguinal anastomosis, L shoulder, lateral brachium, medial to lateral brachium, lateral brachium, all surfaces of the L antebrachium, dorsum of the hand, digits; re-worked all surfaces followed by deep abdominals             PT Education - 12/14/19 1430    Education Details  Pt will wear TG soft as tolerated and perform MLD as she is able at home.    Person(s) Educated  Patient    Methods  Explanation;Demonstration;Tactile cues;Verbal cues;Handout    Comprehension  Verbalized understanding;Returned  demonstration       PT Short Term Goals - 12/08/19 1511      PT SHORT TERM GOAL #1   Title  Pt will demonstrate autonomy with HEP and MLD in order to demonstrate home management.    Baseline  Pt does not have an HEP or know MLD    Time  2    Period  Weeks    Status  New    Target Date  12/29/19        PT Long Term Goals - 12/08/19 1511      PT  LONG TERM GOAL #1   Title  Pt will have decreased girth measurements in the L hand and throughout UE by 2 cm or greater within 4 weeks in order to demonstrate decreased fluid load in the LUE.    Baseline  see measurements.    Time  4    Period  Weeks    Status  New    Target Date  01/12/20      PT LONG TERM GOAL #2   Title  Pt will have appropriate compression garment for the LUE to wear on a daily basis in order to manage her lymphedema.    Baseline  pt currently does not have compresison garment.    Time  4    Period  Weeks    Status  New    Target Date  01/12/20      PT LONG TERM GOAL #3   Title  Pt will demonstrate 140 degrees R shoulder flexion and 120 degrees Bil shoulder abduction to demonstrate improved functional ROM.    Baseline  R shoulder flexion: 113 Abduction: 81 L shoulder abduction: 78    Time  4    Period  Weeks    Status  New    Target Date  01/12/20      PT LONG TERM GOAL #4   Title  Pt will demonstrate 30 degrees L wrist extension, 30 degrees L wrist flexion and 10 degrees L wrist ulnar deviation to demonstrate improved functional mobility.    Baseline  L wrist extension: 20, Flexion:  10, Ulnar Deviation: 3    Time  4    Period  Weeks    Status  New    Target Date  01/12/20      PT LONG TERM GOAL #5   Title  Pt will demonstrate improve posture with reports of 20% improvement in overall pain/function in order to demonstrate an improved quality of life.    Baseline  pain 8/10    Time  4    Period  Weeks    Status  New    Target Date  01/12/20      Additional Long Term Goals   Additional Long Term  Goals  Yes      PT LONG TERM GOAL #6   Title  Pt will score 65% or less on the LLIS to demonstrate improved quality of life with lymphedema.    Baseline  80.88%    Time  4    Period  Weeks    Status  New    Target Date  01/12/20            Plan - 12/14/19 1409    Clinical Impression Statement  Pt presents to physical therapy with continued pitting edema presents in her L hand and soft edema noted throughout her LUE. She was taught self MLD and MLD was performed by the physical therapist over the LUE. TG soft was used for very light compression to see if pt is able to tolerate. Pt will work on self MLD at home as she is able; self MLD was modified due to pt states she is not able to reach her L brachium. Pt will benefit from continued POC at this time.    Personal Factors and Comorbidities  Comorbidity 3+;Time since onset of injury/illness/exacerbation;Past/Current Experience    Comorbidities  history of Bil masectomy with reconstruction and then revision of reconstruction, 12 lymph node removal on the R, radiation therapy, vasculitis  Rehab Potential  Fair    PT Frequency  3x / week    PT Duration  4 weeks    PT Treatment/Interventions  Therapeutic exercise;Neuromuscular re-education;Therapeutic activities;Patient/family education;Manual techniques    PT Next Visit Plan  Assess TG soft add finger wraps.  gradually increase compression as patient is able to tolerate, work on easy AROM activities, Teach MLD minding possible flare up of CHF    PT Home Exercise Plan  Shoulder flexion, elbow flexion/extension, wrist flexion/extension and fist pumps above the level of the heart    Consulted and Agree with Plan of Care  Patient       Patient will benefit from skilled therapeutic intervention in order to improve the following deficits and impairments:  Increased edema, Decreased range of motion, Pain, Postural dysfunction  Visit Diagnosis: History of ductal carcinoma in situ (DCIS) of  breast  Postmastectomy lymphedema  Stiffness of left shoulder, not elsewhere classified  Stiffness of right shoulder, not elsewhere classified  Chronic left shoulder pain  Abnormal posture     Problem List Patient Active Problem List   Diagnosis Date Noted  . Port-A-Cath in place 09/09/2019  . Moderate persistent asthma without complication 17/61/6073  . Acute superficial venous thrombosis of lower extremity, right 07/16/2019  . Injury of left index finger 12/24/2018  . Pituitary adenoma ([place]) 12/23/2018  . History of cervical cancer 12/23/2018  . Chronic respiratory failure with hypoxia ([place]) 12/23/2018  . SOB (shortness of breath)   . Nephrolithiasis 11/07/2018  . Oxygen dependent 11/07/2018  . Migraine 11/07/2018  . PVC's (premature ventricular contractions) 10/27/2018  . Hyperlipidemia 03/09/2014  . Hx of Hodgkins lymphoma   . History of ductal carcinoma in situ (DCIS) of breast   . Thyroid cancer ([place])   . Adrenal insufficiency ([place])   . Hypertension   . Raynaud phenomenon   . GERD (gastroesophageal reflux disease)     PHYSICAL THERAPY DISCHARGE SUMMARY    Plan:                                                    Patient goals were not met. Patient is being discharged due to not returning since the last visit.  ?????       Ander Purpura, PT 12/14/2019, 3:05 PM  [place], [place], [address] Phone: [phone]   Fax:  [phone]  Name: AVEREIGH SPAINHOWER MRN: [id number] Date of Birth: 01/17/1971

## 2019-12-14 NOTE — Patient Instructions (Signed)
Deep Effective Breath   Standing, sitting, or laying down, place both hands on the belly. Take a deep breath IN, expanding the belly; then breath OUT, contracting the belly. Repeat __5__ times. Do __2-3__ sessions per day and before your self massage.  http://gt2.exer.us/866   Copyright  VHI. All rights reserved.  Copyright  VHI. All rights reserved.  Axilla to Inguinal Nodes - Sweep   Make circles in both arm pits where the hair would grow then:  On involved side, make 5 circles at groin at panty line, Then sweep 4 times down the L side;  then pump _5__ times from armpit along side of trunk to outer hip, making your other pathway. Do __1_ time per day.  Copyright  VHI. All rights reserved.  Arm Posterior: Elbow to Shoulder - Sweep (If you can do this if not go to the next step)   Pump _5__ times from back of elbow to top of shoulder. Then inner to outer upper arm _5_ times, then outer arm again _5_ times. Then back to the pathways _2-3_ times. Do _1__ time per day.  Copyright  VHI. All rights reserved.  ARM: [place] Wrist to Elbow - Sweep (start here if you can't do that last step )   Pump or stationary circles _5__ times from wrist to elbow making sure to do both sides of the forearm. Then retrace your steps to the outer arm, and the pathways _2-3_ times each. Do _1__ time per day.  Copyright  VHI. All rights reserved.  ARM: Dorsum of Hand to Shoulder - Sweep (go as far up as you are able with your R shoulder)    Pump or stationary circles _5__ times on back of hand including knuckle spaces and individual fingers working up towards the wrist, then retrace all your steps working back up the forearm, doing both sides; upper outer arm and back to your pathways _2-3_ times each. Then do 5 circles again at uninvolved armpit and involved groin where you started! Good job!! Do __1_ time per day.  Copyright  VHI. All rights reserved.

## 2019-12-15 ENCOUNTER — Ambulatory Visit: Payer: BC Managed Care – PPO | Admitting: Adult Health

## 2019-12-15 ENCOUNTER — Ambulatory Visit: Payer: BC Managed Care – PPO

## 2019-12-15 ENCOUNTER — Ambulatory Visit: Payer: BC Managed Care – PPO | Admitting: Pulmonary Disease

## 2019-12-16 ENCOUNTER — Ambulatory Visit: Payer: BC Managed Care – PPO | Admitting: Rehabilitation

## 2019-12-18 ENCOUNTER — Ambulatory Visit: Payer: BC Managed Care – PPO | Admitting: Rehabilitation

## 2019-12-18 ENCOUNTER — Telehealth: Payer: Self-pay | Admitting: Rehabilitation

## 2019-12-18 NOTE — Telephone Encounter (Signed)
Called regarding missed PT appointment and to remind patient about appt on Monday.  LM due to no answer

## 2019-12-21 ENCOUNTER — Inpatient Hospital Stay: Payer: BC Managed Care – PPO

## 2019-12-21 ENCOUNTER — Other Ambulatory Visit: Payer: Self-pay

## 2019-12-21 ENCOUNTER — Ambulatory Visit: Payer: BC Managed Care – PPO

## 2019-12-21 DIAGNOSIS — Z86000 Personal history of in-situ neoplasm of breast: Secondary | ICD-10-CM

## 2019-12-21 DIAGNOSIS — Z95828 Presence of other vascular implants and grafts: Secondary | ICD-10-CM

## 2019-12-21 DIAGNOSIS — I82811 Embolism and thrombosis of superficial veins of right lower extremities: Secondary | ICD-10-CM

## 2019-12-21 LAB — CBC WITH DIFFERENTIAL (CANCER CENTER ONLY)
Abs Immature Granulocytes: 0.02 10*3/uL (ref 0.00–0.07)
Basophils Absolute: 0.1 10*3/uL (ref 0.0–0.1)
Basophils Relative: 1 %
Eosinophils Absolute: 0.1 10*3/uL (ref 0.0–0.5)
Eosinophils Relative: 1 %
HCT: 37.5 % (ref 36.0–46.0)
Hemoglobin: 11.9 g/dL — ABNORMAL LOW (ref 12.0–15.0)
Immature Granulocytes: 0 %
Lymphocytes Relative: 23 %
Lymphs Abs: 1.9 10*3/uL (ref 0.7–4.0)
MCH: 29.2 pg (ref 26.0–34.0)
MCHC: 31.7 g/dL (ref 30.0–36.0)
MCV: 91.9 fL (ref 80.0–100.0)
Monocytes Absolute: 0.7 10*3/uL (ref 0.1–1.0)
Monocytes Relative: 8 %
Neutro Abs: 5.7 10*3/uL (ref 1.7–7.7)
Neutrophils Relative %: 67 %
Platelet Count: 286 10*3/uL (ref 150–400)
RBC: 4.08 MIL/uL (ref 3.87–5.11)
RDW: 14.6 % (ref 11.5–15.5)
WBC Count: 8.4 10*3/uL (ref 4.0–10.5)
nRBC: 0 % (ref 0.0–0.2)

## 2019-12-21 LAB — CMP (CANCER CENTER ONLY)
ALT: 32 U/L (ref 0–44)
AST: 35 U/L (ref 15–41)
Albumin: 3.6 g/dL (ref 3.5–5.0)
Alkaline Phosphatase: 64 U/L (ref 38–126)
Anion gap: 10 (ref 5–15)
BUN: 15 mg/dL (ref 6–20)
CO2: 24 mmol/L (ref 22–32)
Calcium: 8.4 mg/dL — ABNORMAL LOW (ref 8.9–10.3)
Chloride: 106 mmol/L (ref 98–111)
Creatinine: 1.06 mg/dL — ABNORMAL HIGH (ref 0.44–1.00)
GFR, Est AFR Am: >60 mL/min (ref >60)
GFR, Estimated: >60 mL/min (ref >60)
Glucose, Bld: 137 mg/dL — ABNORMAL HIGH (ref 70–99)
Potassium: 3.6 mmol/L (ref 3.5–5.1)
Sodium: 140 mmol/L (ref 135–145)
Total Bilirubin: 0.3 mg/dL (ref 0.3–1.2)
Total Protein: 7.2 g/dL (ref 6.5–8.1)

## 2019-12-21 MED ORDER — SODIUM CHLORIDE 0.9% FLUSH
10.0000 mL | INTRAVENOUS | Status: DC | PRN
  Administered 2019-12-21: 10 mL
  Filled 2019-12-21: qty 10

## 2019-12-21 MED ORDER — HEPARIN SOD (PORK) LOCK FLUSH 100 UNIT/ML IV SOLN
500.0000 [IU] | Freq: Once | INTRAVENOUS | Status: AC | PRN
  Administered 2019-12-21: 500 [IU]
  Filled 2019-12-21: qty 5

## 2019-12-22 LAB — GLUCOSE 6 PHOSPHATE DEHYDROGENASE
G6PDH: 11.3 U/g{Hb} (ref 4.7–14.6)
Hemoglobin: 12 g/dL (ref 11.1–15.9)

## 2019-12-23 ENCOUNTER — Encounter: Payer: BC Managed Care – PPO | Admitting: Rehabilitation

## 2019-12-25 ENCOUNTER — Ambulatory Visit: Payer: BC Managed Care – PPO

## 2019-12-27 DIAGNOSIS — R911 Solitary pulmonary nodule: Secondary | ICD-10-CM | POA: Diagnosis not present

## 2019-12-28 ENCOUNTER — Ambulatory Visit: Payer: BC Managed Care – PPO

## 2019-12-29 ENCOUNTER — Encounter (HOSPITAL_COMMUNITY)
Admission: RE | Admit: 2019-12-29 | Discharge: 2019-12-29 | Disposition: A | Discharge status: Home / Self Care | Payer: BC Managed Care – PPO | Source: Ambulatory Visit | Attending: Hematology and Oncology | Admitting: Hematology and Oncology

## 2019-12-29 ENCOUNTER — Inpatient Hospital Stay (HOSPITAL_BASED_OUTPATIENT_CLINIC_OR_DEPARTMENT_OTHER): Payer: BC Managed Care – PPO | Admitting: Medical

## 2019-12-29 ENCOUNTER — Encounter (HOSPITAL_COMMUNITY): Payer: Self-pay

## 2019-12-29 ENCOUNTER — Other Ambulatory Visit: Payer: Self-pay | Admitting: Medical

## 2019-12-29 ENCOUNTER — Ambulatory Visit (HOSPITAL_COMMUNITY)
Admission: RE | Admit: 2019-12-29 | Discharge: 2019-12-29 | Disposition: A | Discharge status: Home / Self Care | Payer: BC Managed Care – PPO | Source: Ambulatory Visit | Attending: Hematology and Oncology | Admitting: Hematology and Oncology

## 2019-12-29 ENCOUNTER — Inpatient Hospital Stay: Payer: BC Managed Care – PPO

## 2019-12-29 ENCOUNTER — Other Ambulatory Visit: Payer: Self-pay

## 2019-12-29 VITALS — BP 103/72 | HR 98 | Temp 98.3°F

## 2019-12-29 DIAGNOSIS — Z86000 Personal history of in-situ neoplasm of breast: Secondary | ICD-10-CM | POA: Insufficient documentation

## 2019-12-29 DIAGNOSIS — R918 Other nonspecific abnormal finding of lung field: Secondary | ICD-10-CM | POA: Diagnosis not present

## 2019-12-29 DIAGNOSIS — C73 Malignant neoplasm of thyroid gland: Secondary | ICD-10-CM | POA: Diagnosis not present

## 2019-12-29 DIAGNOSIS — K7689 Other specified diseases of liver: Secondary | ICD-10-CM | POA: Diagnosis not present

## 2019-12-29 DIAGNOSIS — Z8571 Personal history of Hodgkin lymphoma: Secondary | ICD-10-CM | POA: Diagnosis not present

## 2019-12-29 DIAGNOSIS — I82811 Embolism and thrombosis of superficial veins of right lower extremities: Secondary | ICD-10-CM | POA: Insufficient documentation

## 2019-12-29 DIAGNOSIS — R3911 Hesitancy of micturition: Secondary | ICD-10-CM

## 2019-12-29 DIAGNOSIS — Z95828 Presence of other vascular implants and grafts: Secondary | ICD-10-CM

## 2019-12-29 DIAGNOSIS — C50919 Malignant neoplasm of unspecified site of unspecified female breast: Secondary | ICD-10-CM | POA: Diagnosis not present

## 2019-12-29 LAB — URINALYSIS, COMPLETE (UACMP) WITH MICROSCOPIC
Bilirubin Urine: NEGATIVE
Glucose, UA: NEGATIVE mg/dL
Hgb urine dipstick: NEGATIVE
Ketones, ur: NEGATIVE mg/dL
Leukocytes,Ua: NEGATIVE
Nitrite: NEGATIVE
Protein, ur: NEGATIVE mg/dL
Specific Gravity, Urine: 1.006 (ref 1.005–1.030)
pH: 6 (ref 5.0–8.0)

## 2019-12-29 MED ORDER — SODIUM CHLORIDE 0.9% FLUSH
10.0000 mL | INTRAVENOUS | Status: DC | PRN
  Filled 2019-12-29: qty 10

## 2019-12-29 MED ORDER — SODIUM CHLORIDE (PF) 0.9 % IJ SOLN
INTRAMUSCULAR | Status: AC
  Filled 2019-12-29: qty 50

## 2019-12-29 MED ORDER — HEPARIN SOD (PORK) LOCK FLUSH 100 UNIT/ML IV SOLN
500.0000 [IU] | Freq: Once | INTRAVENOUS | Status: DC | PRN
  Filled 2019-12-29: qty 5

## 2019-12-29 MED ORDER — TECHNETIUM TC 99M MEDRONATE IV KIT
21.1000 | PACK | Freq: Once | INTRAVENOUS | Status: AC | PRN
  Administered 2019-12-29: 21.1 via INTRAVENOUS

## 2019-12-29 MED ORDER — HEPARIN SOD (PORK) LOCK FLUSH 100 UNIT/ML IV SOLN
500.0000 [IU] | Freq: Once | INTRAVENOUS | Status: AC
  Administered 2019-12-29: 500 [IU] via INTRAVENOUS

## 2019-12-29 MED ORDER — IOHEXOL 300 MG/ML  SOLN
100.0000 mL | Freq: Once | INTRAMUSCULAR | Status: AC | PRN
  Administered 2019-12-29: 100 mL via INTRAVENOUS

## 2019-12-29 MED ORDER — HEPARIN SOD (PORK) LOCK FLUSH 100 UNIT/ML IV SOLN
INTRAVENOUS | Status: AC
  Filled 2019-12-29: qty 5

## 2019-12-29 NOTE — Progress Notes (Signed)
These results were called to [name]. A message was left for her. she was told to call if there were questions.  Sandi Mealy, MHS, PA-C

## 2019-12-29 NOTE — Progress Notes (Signed)
[facility]pt[facility] Progress Note   [name] [id number] [birth year] 49 y.o.  [name] is managed by Dr. Nicholas Lose  Actively treated with chemotherapy/immunotherapy/hormonal therapy: no  Next scheduled appointment with provider: To be scheduled  Assessment: Plan:    Urinary hesitancy   Urinary hesitancy: A urinalysis was completed which did not show any evidence of a urinary tract infection.  A urine culture was pending.  We will await these results.  Please see After Visit Summary for patient specific instructions.  No future appointments.  No orders of the defined types were placed in this encounter.      Subjective:   Patient ID:  [name] is a 49 y.o. (DOB October 22, 1970) female.  Chief Complaint: No chief complaint on file.   HPI [name]  is a 49 y.o. female with a complex oncologic and hematologic history that includes a history of ductal carcinoma in situ, Hodgkin's lymphoma, thyroid cancer, cervical cancer, and an acute superficial venous thrombus of the right lower extremity.  She is managed by Dr. Nicholas Lose and was being seen in flush today to have her port accessed prior to having restaging CT scans completed.  She reported in the flush room that she was having urinary hesitancy and was concerned that she could be having a urinary tract infection or that this could represent renal failure as she states that she has been told that she has elevations of her creatinine.  She denies fevers, chills, or sweats.  She continues on Lasix daily.   Medications: I have reviewed the patient's current medications.  Allergies:  Allergies  Allergen Reactions  . Na Ferric Gluc Cplx In Sucrose Anaphylaxis  . Cymbalta [Duloxetine Hcl] Swelling and Anxiety  . Mushroom Extract Complex   . Succinylcholine Other (See Comments)    Lock Jaw  . Buprenorphine Hcl Hives  . Compazine Other (See Comments)    Altered mental status Aggression  .  Metoclopramide Other (See Comments)    Dystonia  . Morphine And Related Hives  . Ondansetron Hives and Rash  . Promethazine Hcl Hives  . Tegaderm Ag Mesh [[place]] Rash    Old formulation only, is able to tolerate new formulation    Past Medical History:  Diagnosis Date  . Addison's disease ([place])   . Adrenal insufficiency ([place])   . Anemia   . Anxiety   . Aortic stenosis   . Aortic stenosis   . Appendicitis 12/19/09  . Appendicitis   . Breast cancer ([place])    STATUS POST BILATERAL MASTECTOMY. STATUS POST RECONSTRUCTION. SHE HAD SILICONE BREAST IMPLANTS AND THE LEFT IMPLANT IS LEAKING SLIGHTLY  . Cellulitis of right middle finger 11/07/2018  . Cervical cancer ([place]) 12/23/2018  . Chest pain   . CHF with right heart failure ([place]) 04/17/2017  . Chronic respiratory failure with hypoxia ([place]) 12/23/2018  . Cough variant asthma 04/13/2019  . Depression   . GERD (gastroesophageal reflux disease)   . Headache    migraines  . Heart murmur   . History of kidney stones   . Hodgkin lymphoma ([facility])    STATUS POST MANTLE RADIATION  . Hodgkin's lymphoma ([place])    1987  . Hypertension   . Hypoxia   . Necrotizing fasciitis ([place]) 12/23/2018  . Non-ischemic cardiomyopathy ([place])   . Osteoporosis   . Palpitations   . Pituitary adenoma ([place]) 12/23/2018  . Pneumonia   . PONV (postoperative nausea and vomiting)   . Pre-diabetes  per pt; no meds  . Pulmonary hypertension ([place]) 12/23/2018  . Raynaud phenomenon   . Right heart failure ([place]) 04/17/2017  . Supplemental oxygen dependent    3 liters  . SVT (supraventricular tachycardia) ([place])   . Tachycardia   . Thyroid cancer ([place])    STATUS POST SURGICAL REMOVAL-CURRENT ON THYROID REPLACEMENT    Past Surgical History:  Procedure Laterality Date  . ABDOMINAL HYSTERECTOMY    . AMPUTATION Left 01/30/2019   Procedure: Left Index finger amputation with flap reconstruction and repair reconstruction;  Surgeon: Roseanne Kaufman, MD;  Location: [facility];   Service: Orthopedics;  Laterality: Left;  . APPENDECTOMY    . CARDIAC CATHETERIZATION  05/18/09   NORMAL CATH  . I & D EXTREMITY Left 12/23/2018   Procedure: IRRIGATION AND DEBRIDEMENT HAND / INDEX FINGER;  Surgeon: Roseanne Kaufman, MD;  Location: [facility];  Service: Orthopedics;  Laterality: Left;  Marland Kitchen MASTECTOMY    . PITUITARY SURGERY    . RIGHT/LEFT HEART CATH AND CORONARY ANGIOGRAPHY N/A 04/02/2018   Procedure: RIGHT/LEFT HEART CATH AND CORONARY ANGIOGRAPHY;  Surgeon: Burnell Blanks, MD;  Location: [facility];  Service: Cardiovascular;  Laterality: N/A;  . TOTAL THYROIDECTOMY    . VIDEO BRONCHOSCOPY Bilateral 11/14/2018   Procedure: VIDEO BRONCHOSCOPY WITHOUT FLUORO;  Surgeon: Margaretha Seeds, MD;  Location: [facility];  Service: Cardiopulmonary;  Laterality: Bilateral;  . VIDEO BRONCHOSCOPY WITH ENDOBRONCHIAL ULTRASOUND N/A 11/19/2018   Procedure: VIDEO BRONCHOSCOPY WITH ENDOBRONCHIAL ULTRASOUND;  Surgeon: Margaretha Seeds, MD;  Location: [facility];  Service: Thoracic;  Laterality: N/A;    Family History  Family history unknown: Yes    Social History   Socioeconomic History  . Marital status: Married    Spouse name: Not on file  . Number of children: Not on file  . Years of education: Not on file  . Highest education level: Not on file  Occupational History  . Not on file  Tobacco Use  . Smoking status: Never Smoker  . Smokeless tobacco: Never Used  Substance and Sexual Activity  . Alcohol use: Yes    Comment: social   . Drug use: No  . Sexual activity: Yes    Birth control/protection: Surgical  Other Topics Concern  . Not on file  Social History Narrative  . Not on file   Social Determinants of Health   Financial Resource Strain:   . Difficulty of Paying Living Expenses:   Food Insecurity:   . Worried About Charity fundraiser in the Last Year:   . Arboriculturist in the Last Year:   Transportation Needs:   . Film/video editor (Medical):   Marland Kitchen  Lack of Transportation (Non-Medical):   Physical Activity:   . Days of Exercise per Week:   . Minutes of Exercise per Session:   Stress:   . Feeling of Stress :   Social Connections:   . Frequency of Communication with Friends and Family:   . Frequency of Social Gatherings with Friends and Family:   . Attends Religious Services:   . Active Member of Clubs or Organizations:   . Attends Archivist Meetings:   Marland Kitchen Marital Status:   Intimate Partner Violence:   . Fear of Current or Ex-Partner:   . Emotionally Abused:   Marland Kitchen Physically Abused:   . Sexually Abused:     Past Medical History, Surgical history, Social history, and Family history were reviewed and updated as appropriate.  Please see review of systems for further details on the patient's review from today.   Review of Systems:  Review of Systems  Constitutional: Positive for fatigue. Negative for chills, diaphoresis and fever.  Respiratory: Positive for shortness of breath. Negative for cough.   Cardiovascular: Negative for chest pain, palpitations and leg swelling.  Gastrointestinal: Negative for abdominal pain, constipation, diarrhea, nausea and vomiting.  Genitourinary: Negative for difficulty urinating, dysuria, flank pain, frequency, hematuria and urgency.       Urinary hesitancy  Skin: Negative for rash.  Neurological: Negative for dizziness and headaches.    Objective:   Physical Exam:  LMP  (LMP Unknown)  ECOG: 1  Physical Exam Constitutional:      General: She is not in acute distress.    Appearance: She is not diaphoretic.     Comments: The patient is an adult female who appears to be chronically ill but no acute distress.  She is receiving oxygen via an oxygen concentrator.  HENT:     Head: Normocephalic and atraumatic.  Cardiovascular:     Rate and Rhythm: Normal rate and regular rhythm.     Heart sounds: Normal heart sounds. No murmur. No friction rub. No gallop.   Pulmonary:     Effort:  Pulmonary effort is normal. No respiratory distress.     Breath sounds: Normal breath sounds. No wheezing or rales.  Abdominal:     General: Bowel sounds are normal. There is no distension.     Palpations: Abdomen is soft.     Tenderness: There is abdominal tenderness (Mild suprapubic tenderness). There is no guarding or rebound.  Musculoskeletal:     Right lower leg: Edema present.     Left lower leg: Edema present.  Skin:    General: Skin is warm and dry.  Neurological:     Mental Status: She is alert.     Coordination: Coordination normal.  Psychiatric:        Behavior: Behavior normal.        Thought Content: Thought content normal.        Judgment: Judgment normal.     Lab Review:     Component Value Date/Time   NA 140 12/21/2019 1418   NA 141 04/01/2018 1617   K 3.6 12/21/2019 1418   CL 106 12/21/2019 1418   CO2 24 12/21/2019 1418   GLUCOSE 137 (H) 12/21/2019 1418   BUN 15 12/21/2019 1418   BUN 11 04/01/2018 1617   CREATININE 1.06 (H) 12/21/2019 1418   CALCIUM 8.4 (L) 12/21/2019 1418   PROT 7.2 12/21/2019 1418   PROT 7.1 01/23/2018 1225   ALBUMIN 3.6 12/21/2019 1418   ALBUMIN 4.4 01/23/2018 1225   AST 35 12/21/2019 1418   ALT 32 12/21/2019 1418   ALKPHOS 64 12/21/2019 1418   BILITOT 0.3 12/21/2019 1418   GFRNONAA >60 12/21/2019 1418   GFRAA >60 12/21/2019 1418       Component Value Date/Time   WBC 8.4 12/21/2019 1418   WBC 7.1 07/09/2019 1806   RBC 4.08 12/21/2019 1418   HGB 11.9 (L) 12/21/2019 1418   HGB 12.0 12/21/2019 1418   HCT 37.5 12/21/2019 1418   HCT 39.9 04/01/2018 1617   PLT 286 12/21/2019 1418   PLT 327 04/01/2018 1617   MCV 91.9 12/21/2019 1418   MCV 89 04/01/2018 1617   MCH 29.2 12/21/2019 1418   MCHC 31.7 12/21/2019 1418   RDW 14.6 12/21/2019 1418   RDW 13.6 04/01/2018 1617  LYMPHSABS 1.9 12/21/2019 1418   MONOABS 0.7 12/21/2019 1418   EOSABS 0.1 12/21/2019 1418   BASOSABS 0.1 12/21/2019 1418    -------------------------------  Imaging from last 24 hours (if applicable):  Radiology interpretation: No results found.

## 2019-12-30 ENCOUNTER — Ambulatory Visit: Payer: BC Managed Care – PPO

## 2019-12-30 ENCOUNTER — Encounter: Payer: Self-pay | Admitting: Hematology and Oncology

## 2019-12-30 LAB — URINE CULTURE: Culture: <10000 — AB

## 2019-12-31 DIAGNOSIS — I999 Unspecified disorder of circulatory system: Secondary | ICD-10-CM | POA: Diagnosis not present

## 2019-12-31 DIAGNOSIS — K120 Recurrent oral aphthae: Secondary | ICD-10-CM | POA: Diagnosis not present

## 2019-12-31 DIAGNOSIS — Z5181 Encounter for therapeutic drug level monitoring: Secondary | ICD-10-CM | POA: Diagnosis not present

## 2020-01-03 NOTE — Progress Notes (Signed)
HEMATOLOGY-ONCOLOGY [facility] VIDEO VISIT PROGRESS NOTE  I connected with [name] on 01/04/2020 at  1:30 PM EDT by MyChart video conference and verified that I am speaking with the correct person using two identifiers.  I discussed the limitations, risks, security and privacy concerns of performing an evaluation and management service by MyChart and the availability of in person appointments.  I also discussed with the patient that there may be a patient responsible charge related to this service. The patient expressed understanding and agreed to proceed.  Patient's Location: Home Physician Location: Clinic  CHIEF COMPLIANT: Follow-up of SVT on surveillance  INTERVAL HISTORY: [name] is a 49 y.o. female with above-mentioned history of superficial venous thrombosiswho is currently on surveillance. CT CAP and bone scan on 12/29/19 showed no evidence of metastatic disease. She presents over MyChart todayfor follow-up.  The bone scan revealed the manubriosternal lesion which is also very tender to palpation.  There was also a subcutaneous nodule on the left arm which is palpable and is of concern.  Oncology History  Hx of Hodgkins lymphoma   Initial Diagnosis   Hx of Hodgkins lymphoma    Radiation Therapy     Thyroid cancer ([place])   Initial Diagnosis   Thyroid cancer ([facility])    Remission      Remission      Radiation Therapy     History of cervical cancer  12/23/2018 Initial Diagnosis   History of cervical cancer    Remission      Radiation Therapy      Observations/Objective:  There were no vitals filed for this visit. There is no height or weight on file to calculate BMI.  I have reviewed the data as listed CMP Latest Ref Rng & Units 12/21/2019 11/30/2019 07/09/2019  Glucose 70 - 99 mg/dL 137(H) 108(H) 118(H)  BUN 6 - 20 mg/dL _0 Creatinine 0.44 - 1.00 mg/dL 1.06(H) 1.06(H) 0.96  Sodium 135 - 145 mmol/L 140 141 138  Potassium 3.5 - 5.1 mmol/L 3.6 3.3(L) 3.9    Chloride 98 - 111 mmol/L 106 104 107  CO2 22 - 32 mmol/L 24 28 21(L)  Calcium 8.9 - 10.3 mg/dL 8.4(L) 8.6(L) 8.8(L)  Total Protein 6.5 - 8.1 g/dL 7.2 7.5 7.4  Total Bilirubin 0.3 - 1.2 mg/dL 0.3 0.3 0.2(L)  Alkaline Phos 38 - 126 U/L 64 60 55  AST 15 - 41 U/L 35 39 41  ALT 0 - 44 U/L 32 36 63(H)    Lab Results  Component Value Date   WBC 8.4 12/21/2019   HGB 11.9 (L) 12/21/2019   HGB 12.0 12/21/2019   HCT 37.5 12/21/2019   MCV 91.9 12/21/2019   PLT 286 12/21/2019   NEUTROABS 5.7 12/21/2019      Assessment Plan:  Acute superficial venous thrombosis of lower extremity, right 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  She is seeing rheumatology at [place] and prothrombin chains: Normal G6PD level: Normal  12/29/2019: CT CAP and bone scan: No evidence of recurrent cancers. Bone scan 12/29/2019: No evidence of bone metastatic disease.  There is  a focus of intense uptake right lateral manubriosternal junction.  Also subcutaneous soft tissue uptake in the right arm  Radiology counseling: I discussed the case with our radiologist Dr. Jarvis Newcomer who felt that the manubrial lesion cannot be biopsied because there is no CT correlate for it.  He felt that the left arm subcutaneous lesion since it is palpable would be the best to obtain tissue.  I will request ultrasound-guided biopsy of this lesion. To biopsy the manubrium, she might need orthopedics or cardiothoracic surgeon to biopsy it.  Urinary symptoms: No  evidence of UTI on UA and culture.  Return to clinic in 2 weeks or so to discuss results of these tests with a MyChart virtual visit  I discussed the assessment and treatment plan with the patient. The patient was provided an opportunity to ask questions and all were answered. The patient agreed with the plan and demonstrated an understanding of the instructions. The patient was advised to call back or seek an in-person evaluation if the symptoms worsen or if the condition fails to improve as anticipated.   I provided 30 minutes of face-to-face MyChart video visit time during this encounter.    Rulon Eisenmenger, MD 01/04/2020   I, Molly Dorshimer, am acting as scribe for Nicholas Lose, MD.  I have reviewed the above documentation for accuracy and completeness, and I agree with the above.

## 2020-01-04 ENCOUNTER — Encounter: Payer: Self-pay | Admitting: Hematology and Oncology

## 2020-01-04 ENCOUNTER — Ambulatory Visit: Payer: BC Managed Care – PPO

## 2020-01-04 ENCOUNTER — Inpatient Hospital Stay: Payer: BC Managed Care – PPO | Attending: Hematology and Oncology | Admitting: Hematology and Oncology

## 2020-01-04 ENCOUNTER — Encounter (HOSPITAL_COMMUNITY): Payer: Self-pay | Admitting: Radiology

## 2020-01-04 ENCOUNTER — Other Ambulatory Visit: Payer: Self-pay | Admitting: Hematology and Oncology

## 2020-01-04 DIAGNOSIS — Z452 Encounter for adjustment and management of vascular access device: Secondary | ICD-10-CM | POA: Insufficient documentation

## 2020-01-04 DIAGNOSIS — I82811 Embolism and thrombosis of superficial veins of right lower extremities: Secondary | ICD-10-CM | POA: Diagnosis not present

## 2020-01-04 DIAGNOSIS — R2232 Localized swelling, mass and lump, left upper limb: Secondary | ICD-10-CM

## 2020-01-04 DIAGNOSIS — R110 Nausea: Secondary | ICD-10-CM

## 2020-01-04 DIAGNOSIS — Z86718 Personal history of other venous thrombosis and embolism: Secondary | ICD-10-CM | POA: Insufficient documentation

## 2020-01-04 NOTE — Progress Notes (Signed)
[name]. [name] Female, 49 y.o., 1970/11/12 MRN:  [id number] Phone:  [phone] [name]) PCP:  Su Grand, MD Primary Cvg:  [employer]  RE: [place] Core Biopsy Lymph Nodes Received: Today Message Contents  Arne Cleveland, MD  [name]  Ok   [place] core LUE palpable lesion (+ on bone scan)  You can put on my day   DDH       Previous Messages   ----- Message -----  From: Garth Bigness D  Sent: 01/04/2020  3:12 PM EDT  To: Arne Cleveland, MD  Subject: [place] Core Biopsy Lymph Nodes            Procedure: [place] Core Biopsy (Lymph Nodes)   Reason: Skin lump of arm, left, Biopsy of left arm subcutaneous nodule detected on a bone scan, palpable   Discussed with Dr. Jarvis Newcomer   History: CT, [place] Bone in computer   Provider: Nicholas Lose   Provider Contact: [phone]

## 2020-01-04 NOTE — Assessment & Plan Note (Signed)
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  She is seeing rheumatology at [place] and prothrombin chains: Normal G6PD level: Normal  12/29/2019: CT CAP and bone scan: No evidence of recurrent cancers. Bone scan 12/29/2019: No evidence of bone metastatic disease.  There is a focus of intense uptake right lateral manubriosternal junction.  Urinary symptoms: No evidence of UTI on UA and culture.

## 2020-01-05 ENCOUNTER — Other Ambulatory Visit: Payer: Self-pay | Admitting: Hematology and Oncology

## 2020-01-05 ENCOUNTER — Other Ambulatory Visit: Payer: Self-pay | Admitting: *Deleted

## 2020-01-05 MED ORDER — OXYCODONE-ACETAMINOPHEN 5-325 MG PO TABS: 1.0000 | ORAL_TABLET | Freq: Three times a day (TID) | ORAL | 0 refills | Status: DC | PRN

## 2020-01-05 MED ORDER — METHYLPREDNISOLONE 4 MG PO TBPK: ORAL_TABLET | ORAL | 0 refills | Status: DC

## 2020-01-05 NOTE — Progress Notes (Signed)
Per MD pt to be prescribed medrol dose pack to help with inflammation pain in her chest.  Prescription sent to pharmacy on file and pt notified.

## 2020-01-05 NOTE — Progress Notes (Signed)
Percocet 20 tablets have been prescribed for intractable pain in the chest wall around the manubrium

## 2020-01-06 ENCOUNTER — Ambulatory Visit: Payer: BC Managed Care – PPO | Admitting: Rehabilitation

## 2020-01-08 ENCOUNTER — Ambulatory Visit: Payer: BC Managed Care – PPO | Admitting: Rehabilitation

## 2020-01-08 ENCOUNTER — Encounter: Payer: Self-pay | Admitting: Hematology and Oncology

## 2020-01-11 ENCOUNTER — Ambulatory Visit: Payer: BC Managed Care – PPO

## 2020-01-11 ENCOUNTER — Telehealth: Payer: Self-pay | Admitting: Hematology and Oncology

## 2020-01-11 NOTE — Telephone Encounter (Signed)
Scheduled appt per 4/12 sch message - unable to reach pt . Left message with appt date and time

## 2020-01-12 ENCOUNTER — Other Ambulatory Visit: Payer: Self-pay | Admitting: Radiology

## 2020-01-12 ENCOUNTER — Other Ambulatory Visit: Payer: Self-pay | Admitting: Student

## 2020-01-12 ENCOUNTER — Other Ambulatory Visit: Payer: Self-pay

## 2020-01-12 ENCOUNTER — Inpatient Hospital Stay: Payer: BC Managed Care – PPO

## 2020-01-12 DIAGNOSIS — Z86718 Personal history of other venous thrombosis and embolism: Secondary | ICD-10-CM | POA: Diagnosis not present

## 2020-01-12 DIAGNOSIS — Z452 Encounter for adjustment and management of vascular access device: Secondary | ICD-10-CM | POA: Diagnosis not present

## 2020-01-12 DIAGNOSIS — Z95828 Presence of other vascular implants and grafts: Secondary | ICD-10-CM

## 2020-01-12 MED ORDER — HEPARIN SOD (PORK) LOCK FLUSH 100 UNIT/ML IV SOLN
500.0000 [IU] | Freq: Once | INTRAVENOUS | Status: AC | PRN
  Administered 2020-01-12: 500 [IU]
  Filled 2020-01-12: qty 5

## 2020-01-12 MED ORDER — SODIUM CHLORIDE 0.9% FLUSH
10.0000 mL | INTRAVENOUS | Status: DC | PRN
  Administered 2020-01-12: 10 mL
  Filled 2020-01-12: qty 10

## 2020-01-12 NOTE — Progress Notes (Signed)
Pt states that she is going to cancer center today to have her PAC accessed. She states that versed for conscious sedation does not work for her. She states that she will need propofol for sedation. I advised that radiology RN do not administer propofol. That would have to be rescheduled with anesthesia. Pt states that she would like to try the biopsy tomorrow with benadryl and fentanyl. She states that it is imperative that she have this done and get the results. Instructions for biopsy given. Pt verbalized understanding.

## 2020-01-13 ENCOUNTER — Ambulatory Visit (HOSPITAL_COMMUNITY): Payer: BC Managed Care – PPO

## 2020-01-13 ENCOUNTER — Other Ambulatory Visit: Payer: Self-pay | Admitting: *Deleted

## 2020-01-13 ENCOUNTER — Encounter: Payer: Self-pay | Admitting: *Deleted

## 2020-01-13 ENCOUNTER — Ambulatory Visit: Payer: BC Managed Care – PPO

## 2020-01-13 ENCOUNTER — Other Ambulatory Visit: Payer: Self-pay | Admitting: Hematology and Oncology

## 2020-01-13 ENCOUNTER — Ambulatory Visit (HOSPITAL_COMMUNITY)
Admission: RE | Admit: 2020-01-13 | Discharge: 2020-01-13 | Disposition: A | Discharge status: Home / Self Care | Payer: BC Managed Care – PPO | Source: Ambulatory Visit | Attending: Hematology and Oncology | Admitting: Hematology and Oncology

## 2020-01-13 DIAGNOSIS — R2232 Localized swelling, mass and lump, left upper limb: Secondary | ICD-10-CM

## 2020-01-13 DIAGNOSIS — Z86000 Personal history of in-situ neoplasm of breast: Secondary | ICD-10-CM

## 2020-01-13 DIAGNOSIS — C8190 Hodgkin lymphoma, unspecified, unspecified site: Secondary | ICD-10-CM | POA: Diagnosis not present

## 2020-01-13 MED ORDER — SODIUM CHLORIDE 0.9% FLUSH: 10.0000 mL | INTRAVENOUS | Status: DC | PRN

## 2020-01-13 MED ORDER — FENTANYL CITRATE (PF) 100 MCG/2ML IJ SOLN
INTRAMUSCULAR | Status: AC
  Filled 2020-01-13: qty 2

## 2020-01-13 MED ORDER — SODIUM CHLORIDE 0.9 % IV SOLN: INTRAVENOUS | Status: DC

## 2020-01-13 MED ORDER — HYDROCORTISONE NA SUCCINATE PF 100 MG IJ SOLR
100.0000 mg | Freq: Once | INTRAMUSCULAR | Status: AC
  Administered 2020-01-13: 100 mg via INTRAVENOUS
  Filled 2020-01-13: qty 2

## 2020-01-13 MED ORDER — DIPHENHYDRAMINE HCL 50 MG/ML IJ SOLN
INTRAMUSCULAR | Status: AC
  Administered 2020-01-13: 50 mg via INTRAVENOUS
  Filled 2020-01-13: qty 1

## 2020-01-13 MED ORDER — CHLORHEXIDINE GLUCONATE CLOTH 2 % EX PADS: 6.0000 | MEDICATED_PAD | Freq: Every day | CUTANEOUS | Status: DC

## 2020-01-13 MED ORDER — MIDAZOLAM HCL 2 MG/2ML IJ SOLN
INTRAMUSCULAR | Status: AC
  Filled 2020-01-13: qty 2

## 2020-01-13 MED ORDER — DIPHENHYDRAMINE HCL 25 MG PO CAPS: 50.0000 mg | ORAL_CAPSULE | Freq: Once | ORAL | Status: DC

## 2020-01-13 MED ORDER — SODIUM CHLORIDE 0.9% FLUSH: 10.0000 mL | Freq: Two times a day (BID) | INTRAVENOUS | Status: DC

## 2020-01-13 MED ORDER — DIPHENHYDRAMINE HCL 50 MG/ML IJ SOLN: 50.0000 mg | Freq: Once | INTRAMUSCULAR | Status: AC

## 2020-01-13 MED ORDER — LIDOCAINE HCL (PF) 1 % IJ SOLN
INTRAMUSCULAR | Status: AC
  Filled 2020-01-13: qty 30

## 2020-01-13 NOTE — Progress Notes (Signed)
Received call from [facility] stating they are not able to biopsy the manubrial lesion.  Per MD referral placed to [facility].

## 2020-01-13 NOTE — Progress Notes (Signed)
Per MD request, referral placed to [facility] for evaluation and possible biopsy of manubrium.

## 2020-01-13 NOTE — Sedation Documentation (Signed)
Patient denies necessity for discharge instructions, she will obtain information via mychart. Patient is ambulatory at this time and placed back on baseline 3 liters via nasal cannula.

## 2020-01-13 NOTE — H&P (Signed)
Chief Complaint: Patient was seen in consultation today for subcutaneous nodule  Referring Physician(s): Gudena,Vinay  Supervising Physician: Arne Cleveland  Patient Status: [facility] - Out-pt  History of Present Illness: [name] is a 49 y.o. female with past medical history of superficial venous thrombosis, Hodgkins lymphoma, and breast cancer who is followed for Heme/Onc for ongoing surveillance post treatment.  Although her recent CT CAP was negative for evidence of metastatic disease, a bone scan did show a manubriosternal lesion as well as a subcutaneous left arm nodule of concern. IR consulted for biopsy at the request of Dr. Lindi Adie.   Patient presents today in her usual state of health.  She is on 3-4 L [place] for history of respiratory failure.  She reports an additional history of Addison's disease requiring IV hydration and steroids prior to any procedure. She denies concerns or complaints today.  Her LUE nodule is palpable, non-tender. She is agreeable to procedure today.  She has been NPO.  Past Medical History:  Diagnosis Date  . Addison's disease ([place])   . Adrenal insufficiency ([place])   . Anemia   . Anxiety   . Aortic stenosis   . Aortic stenosis   . Appendicitis 12/19/09  . Appendicitis   . Breast cancer ([place])    STATUS POST BILATERAL MASTECTOMY. STATUS POST RECONSTRUCTION. SHE HAD SILICONE BREAST IMPLANTS AND THE LEFT IMPLANT IS LEAKING SLIGHTLY  . Cellulitis of right middle finger 11/07/2018  . Cervical cancer ([place]) 12/23/2018  . Chest pain   . CHF with right heart failure ([place]) 04/17/2017  . Chronic respiratory failure with hypoxia ([place]) 12/23/2018  . Cough variant asthma 04/13/2019  . Depression   . GERD (gastroesophageal reflux disease)   . Headache    migraines  . Heart murmur   . History of kidney stones   . Hodgkin lymphoma (HCC)    STATUS POST MANTLE RADIATION  . Hodgkin's lymphoma ([place])    1987  . Hypertension   . Hypoxia   . Necrotizing fasciitis  ([place]) 12/23/2018  . Non-ischemic cardiomyopathy ([place])   . Osteoporosis   . Palpitations   . Pituitary adenoma ([place]) 12/23/2018  . Pneumonia   . PONV (postoperative nausea and vomiting)   . Pre-diabetes    per pt; no meds  . Pulmonary hypertension ([place]) 12/23/2018  . Raynaud phenomenon   . Right heart failure ([place]) 04/17/2017  . Supplemental oxygen dependent    3 liters  . SVT (supraventricular tachycardia) ([place])   . Tachycardia   . Thyroid cancer ([place])    STATUS POST SURGICAL REMOVAL-CURRENT ON THYROID REPLACEMENT    Past Surgical History:  Procedure Laterality Date  . ABDOMINAL HYSTERECTOMY    . AMPUTATION Left 01/30/2019   Procedure: Left Index finger amputation with flap reconstruction and repair reconstruction;  Surgeon: Roseanne Kaufman, MD;  Location: [place];  Service: Orthopedics;  Laterality: Left;  . APPENDECTOMY    . CARDIAC CATHETERIZATION  05/18/09   NORMAL CATH  . I & D EXTREMITY Left 12/23/2018   Procedure: IRRIGATION AND DEBRIDEMENT HAND / INDEX FINGER;  Surgeon: Roseanne Kaufman, MD;  Location: [place];  Service: Orthopedics;  Laterality: Left;  Marland Kitchen MASTECTOMY    . PITUITARY SURGERY    . RIGHT/LEFT HEART CATH AND CORONARY ANGIOGRAPHY N/A 04/02/2018   Procedure: RIGHT/LEFT HEART CATH AND CORONARY ANGIOGRAPHY;  Surgeon: Burnell Blanks, MD;  Location: [place] [facility];  Service: Cardiovascular;  Laterality: N/A;  . TOTAL THYROIDECTOMY    . VIDEO  BRONCHOSCOPY Bilateral 11/14/2018   Procedure: VIDEO BRONCHOSCOPY WITHOUT FLUORO;  Surgeon: Margaretha Seeds, MD;  Location: [place];  Service: Cardiopulmonary;  Laterality: Bilateral;  . VIDEO BRONCHOSCOPY WITH ENDOBRONCHIAL ULTRASOUND N/A 11/19/2018   Procedure: VIDEO BRONCHOSCOPY WITH ENDOBRONCHIAL ULTRASOUND;  Surgeon: Margaretha Seeds, MD;  Location: [facility];  Service: Thoracic;  Laterality: N/A;    Allergies: Na ferric gluc cplx in sucrose, Cymbalta [duloxetine hcl], Mushroom extract complex, Succinylcholine,  Buprenorphine hcl, Compazine, Metoclopramide, Morphine and related, Ondansetron, Promethazine hcl, and Tegaderm ag mesh [silver]  Medications: Prior to Admission medications   Medication Sig Start Date End Date Taking? Authorizing Provider  ADDERALL XR 15 MG 24 hr capsule Take 15 mg by mouth daily as needed (adhd).  06/19/19  Yes [provider]  albuterol (2.5 MG/3ML) 0.083% NEBU 3 mL, albuterol (5 MG/ML) 0.5% NEBU 0.5 mL Inhale 3 mg into the lungs every 6 (six) hours as needed (SOB).    Yes [provider]  albuterol (VENTOLIN HFA) 108 (90 Base) MCG/ACT inhaler Inhale 2 puffs into the lungs every 6 (six) hours as needed for wheezing or shortness of breath.   Yes [provider]  Alum & Mag Hydroxide-Simeth (GI COCKTAIL) SUSP suspension Take 30 mLs daily as needed by mouth for indigestion (BLEEDING ULCERS). Shake well.   Yes [provider]  aspirin 325 MG tablet Take 325 mg by mouth at bedtime.    Yes [provider]  beclomethasone (QVAR) 80 MCG/ACT inhaler Inhale 1 puff into the lungs 2 (two) times daily.    Yes [provider]  clonazePAM (KLONOPIN) 1 MG tablet Take 1 mg by mouth 2 (two) times daily.    Yes [provider]  colchicine 0.6 MG tablet Take 0.6 mg by mouth daily.  12/31/19  Yes [provider]  dexlansoprazole (DEXILANT) 60 MG capsule Take 60 mg by mouth daily.     Yes [provider]  diphenhydrAMINE (BENADRYL) 50 MG/ML injection Inject 1 mL (50 mg total) into the vein every 6 (six) hours as needed (nausea). 12/25/18  Yes Rai, Ripudeep K, MD  EPINEPHRINE 0.3 mg/0.3 mL IJ SOAJ injection INJECT 0.3 MLS (0.3 MG TOTAL) INTO THE MUSCLE AS NEEDED FOR ANAPHYLAXIS. 12/09/19  Yes Margaretha Seeds, MD  escitalopram (LEXAPRO) 20 MG tablet Take 20 mg by mouth every evening.  04/24/16  Yes [provider]  estradiol (ESTRACE) 2 MG tablet Take 2 mg by mouth every evening.  02/01/16  Yes [provider]    fluticasone furoate-vilanterol (BREO ELLIPTA) 200-25 MCG/INH AEPB Inhale 1 puff into the lungs daily.   Yes [provider]  furosemide (LASIX) 40 MG tablet Take 40 mg by mouth daily.    Yes [provider]  Galcanezumab-gnlm 120 MG/ML SOAJ Inject 120 mg into the skin every 28 (twenty-eight) days.  03/31/19  Yes [provider]  hydrocortisone (CORTEF) 10 MG tablet Take 10-20 mg by mouth See admin instructions. Take 20 mg in the am and 39m in the evening   Yes [provider]  levothyroxine (SYNTHROID, LEVOTHROID) 112 MCG tablet Take 112 mcg by mouth daily before breakfast.   Yes [provider]  LORazepam (ATIVAN) 0.5 MG tablet TAKE 1 TO 2 TABLETS BY MOUTH TWICE A DAY AS NEEDED FOR NAUSEA Patient taking differently: Take 0.5-1 mg by mouth 2 (two) times daily as needed (Nausea).  01/04/20  Yes GNicholas Lose MD  Mepolizumab (NUCALA) 100 MG SOLR Inject 100 mg into the skin every  28 (twenty-eight) days. 05/13/19  Yes Margaretha Seeds, MD  metoprolol succinate (TOPROL-XL) 25 MG 24 hr tablet Take 37.5 mg by mouth daily.    Yes [provider]  oxyCODONE-acetaminophen (PERCOCET/ROXICET) 5-325 MG tablet Take 1 tablet by mouth every 8 (eight) hours as needed for severe pain. 01/05/20  Yes Nicholas Lose, MD  OXYGEN Inhale 4 L into the lungs continuous.    Yes [provider]  Rimegepant Sulfate (NURTEC) 75 MG TBDP Take 75 mg by mouth daily as needed (Migraine).    Yes [provider]  rosuvastatin (CRESTOR) 10 MG tablet Take 10 mg by mouth daily.  02/28/18  Yes [provider]  silver sulfADIAZINE (SILVADENE) 1 % cream Apply 1 application topically daily as needed (Open Wound).  11/16/19  Yes [provider]  sucralfate (CARAFATE) 1 g tablet Take 1 g by mouth daily as needed (FOR ULCERS).    Yes [provider]  topiramate (TOPAMAX) 100 MG tablet Take 150 mg by mouth at bedtime.    Yes [provider]   traMADol (ULTRAM) 50 MG tablet Take 50 mg by mouth daily as needed (migraine).  06/28/16  Yes [provider]  zolpidem (AMBIEN CR) 12.5 MG CR tablet Take 12.5 mg by mouth at bedtime.     Yes [provider]  benzonatate (TESSALON) 200 MG capsule Take 1 capsule (200 mg total) by mouth 3 (three) times daily as needed for cough. 04/15/19   Martyn Ehrich, NP     Family History  Family history unknown: Yes    Social History   Socioeconomic History  . Marital status: Married    Spouse name: Not on file  . Number of children: Not on file  . Years of education: Not on file  . Highest education level: Not on file  Occupational History  . Not on file  Tobacco Use  . Smoking status: Never Smoker  . Smokeless tobacco: Never Used  Substance and Sexual Activity  . Alcohol use: Yes    Comment: social   . Drug use: No  . Sexual activity: Yes    Birth control/protection: Surgical  Other Topics Concern  . Not on file  Social History Narrative  . Not on file   Social Determinants of Health   Financial Resource Strain:   . Difficulty of Paying Living Expenses:   Food Insecurity:   . Worried About Charity fundraiser in the Last Year:   . Arboriculturist in the Last Year:   Transportation Needs:   . Film/video editor (Medical):   Marland Kitchen Lack of Transportation (Non-Medical):   Physical Activity:   . Days of Exercise per Week:   . Minutes of Exercise per Session:   Stress:   . Feeling of Stress :   Social Connections:   . Frequency of Communication with Friends and Family:   . Frequency of Social Gatherings with Friends and Family:   . Attends Religious Services:   . Active Member of Clubs or Organizations:   . Attends Archivist Meetings:   Marland Kitchen Marital Status:      Review of Systems: A 12 point ROS discussed and pertinent positives are indicated in the HPI above.  All other systems are negative.  Review of Systems  Constitutional: Negative for  fatigue and fever.  Respiratory: Negative for cough and shortness of breath.   Cardiovascular: Negative for chest pain.  Gastrointestinal: Negative for abdominal pain, nausea and vomiting.  Musculoskeletal: Negative for back pain.  Psychiatric/Behavioral: Negative for behavioral problems and confusion.    Vital Signs: BP 123/78 (BP Location: Left Arm)   Pulse 91   Temp 97.7 F (36.5 C) (Skin)   Resp 15   Ht _0  (1.651 m)   Wt 188 lb (85.3 kg)   LMP  (LMP Unknown)   SpO2 100%   BMI 31.28 kg/m   Physical Exam Vitals and nursing note reviewed.  Constitutional:      Appearance: Normal appearance.  HENT:     Mouth/Throat:     Mouth: Mucous membranes are moist.     Pharynx: Oropharynx is clear.  Neck:     Comments: R-sided Port Cardiovascular:     Rate and Rhythm: Normal rate and regular rhythm.  Pulmonary:     Effort: Pulmonary effort is normal. No respiratory distress.     Breath sounds: Normal breath sounds.     Comments: On nasal cannula Abdominal:     General: Abdomen is flat.     Palpations: Abdomen is soft.  Musculoskeletal:     Comments: Palpable LUE mass  Skin:    General: Skin is warm and dry.  Neurological:     General: No focal deficit present.     Mental Status: She is alert and oriented to person, place, and time. Mental status is at baseline.  Psychiatric:        Mood and Affect: Mood normal.        Behavior: Behavior normal.        Thought Content: Thought content normal.        Judgment: Judgment normal.      MD Evaluation Airway: WNL Heart: WNL Abdomen: WNL Chest/ Lungs: WNL(on 3-4L O2) ASA  Classification: 3 Mallampati/Airway Score: One   Imaging: CT Chest W Contrast  Result Date: 12/29/2019 CLINICAL DATA:  Breast cancer, status post bilateral mastectomy, chemotherapy, and radiation, for restaging. History of thyroid cancer, Hodgkin's lymphoma, cervical cancer status post hysterectomy, and melanoma. EXAM: CT CHEST, ABDOMEN, AND PELVIS  WITH CONTRAST TECHNIQUE: Multidetector CT imaging of the chest, abdomen and pelvis was performed following the standard protocol during bolus administration of intravenous contrast. CONTRAST:  164m OMNIPAQUE IOHEXOL 300 MG/ML  SOLN COMPARISON:  CT chest dated 07/09/2019. CT abdomen/pelvis dated 06/16/2015. FINDINGS: CT CHEST FINDINGS Cardiovascular: Heart is normal in size.  No pericardial effusion. No evidence thoracic aortic aneurysm. Mild atherosclerotic calcifications the aortic root/arch. Right chest port terminates cavoatrial junction. Mediastinum/Nodes: No suspicious mediastinal, hilar, or axillary lymphadenopathy. Status post right axillary lymph node dissection. Status post thyroidectomy. Lungs/Pleura: No suspicious pulmonary nodules. Stable subpleural nodule along the posterior right hemidiaphragm demonstrates internal fat (series 2/image 38), unchanged from 2016, benign. Faint ground-glass opacities in the bilateral lower lobes likely reflect mild atelectasis. No focal consolidation. No pleural effusion or pneumothorax. Musculoskeletal: Status post bilateral mastectomy with reconstruction. No focal osseous lesions. CT ABDOMEN PELVIS FINDINGS Hepatobiliary: Scattered probable cysts measuring up to 6 mm in segment 4 (series 2/image 45). Gallbladder is unremarkable. No intrahepatic or extrahepatic ductal dilatation. Pancreas: Within normal limits. Spleen: Within normal limits. Adrenals/Urinary Tract: Adrenal glands are within normal limits. Subcentimeter renal cysts bilaterally.  No hydronephrosis. Bladder is within normal limits. Stomach/Bowel: Stomach is within normal limits. No evidence of bowel obstruction. Prior appendectomy. No colonic wall thickening or mass is evident on CT. Vascular/Lymphatic: No evidence of abdominal aortic aneurysm. Atherosclerotic calcifications of the abdominal aorta and branch vessels. No suspicious abdominopelvic lymphadenopathy. Reproductive: Status post hysterectomy.  No  adnexal masses. Other: No abdominopelvic ascites. Musculoskeletal: Visualized osseous structures are within normal limits. Mild subcutaneous stranding along the anterior abdominal wall and bilateral gluteal regions, likely related to subcutaneous injection sites. IMPRESSION: Status post bilateral mastectomy and right axillary lymph node dissection. Status post thyroidectomy.  Status post hysterectomy. No evidence of recurrent or metastatic disease in this patient with history of multiple prior malignancies. Electronically Signed   By: Julian Hy M.D.   On: 12/29/2019 22:18   NM Bone Scan Whole Body  Result Date: 12/29/2019 CLINICAL DATA:  Breast cancer, staging, history Hodgkin lymphoma, thyroid cancer, pituitary adenoma EXAM: NUCLEAR MEDICINE WHOLE BODY BONE SCAN TECHNIQUE: Whole body anterior and posterior images were obtained approximately 3 hours after intravenous injection of radiopharmaceutical. RADIOPHARMACEUTICALS:  21.1 mCi Technetium-78mMDP IV COMPARISON:  08/03/2010 Correlation: CT chest abdomen pelvis 12/29/2019 FINDINGS: Uptake at the shoulders, elbows, hands, knees, typically degenerative. Uptake at anterior RIGHT chest corresponds to Port-A-Cath reservoir injection site. Additional tracer at the RIGHT lateral aspect of the manubriosternal junction is identified, uncertain etiology; this could represent a metastatic lesion but no sclerosis or destructive lesion is seen at this site by CT. This also corresponds to the course of the Port-A-Cath tubing, question retained tracer within the Port-A-Cath tubing. No other sites of abnormal osseous tracer uptake are identified. Single focus of soft tissue localization of tracer is seen at the posterior mid LEFT upper arm, nonspecific. IMPRESSION: No definite scintigraphic evidence of osseous metastatic disease. Focus of intense tracer accumulation at the RIGHT lateral aspect of the manubriosternal junction, uncertain if related to retained tracer  within Port-A-Cath tubing or a subtle osseous lesion involving the sternum; if clinically indicated, a manubriosternal lesion could be excluded by MR. Electronically Signed   By: MLavonia DanaM.D.   On: 12/29/2019 18:05   CT Abdomen Pelvis W Contrast  Result Date: 12/29/2019 CLINICAL DATA:  Breast cancer, status post bilateral mastectomy, chemotherapy, and radiation, for restaging. History of thyroid cancer, Hodgkin's lymphoma, cervical cancer status post hysterectomy, and melanoma. EXAM: CT CHEST, ABDOMEN, AND PELVIS WITH CONTRAST TECHNIQUE: Multidetector CT imaging of the chest, abdomen and pelvis was performed following the standard protocol during bolus administration of intravenous contrast. CONTRAST:  1041mOMNIPAQUE IOHEXOL 300 MG/ML  SOLN COMPARISON:  CT chest dated 07/09/2019. CT abdomen/pelvis dated 06/16/2015. FINDINGS: CT CHEST FINDINGS Cardiovascular: Heart is normal in size.  No pericardial effusion. No evidence thoracic aortic aneurysm. Mild atherosclerotic calcifications the aortic root/arch. Right chest port terminates cavoatrial junction. Mediastinum/Nodes: No suspicious mediastinal, hilar, or axillary lymphadenopathy. Status post right axillary lymph node dissection. Status post thyroidectomy. Lungs/Pleura: No suspicious pulmonary nodules. Stable subpleural nodule along the posterior right hemidiaphragm demonstrates internal fat (series 2/image 38), unchanged from 2016, benign. Faint ground-glass opacities in the bilateral lower lobes likely reflect mild atelectasis. No focal consolidation. No pleural effusion or pneumothorax. Musculoskeletal: Status post bilateral mastectomy with reconstruction. No focal osseous lesions. CT ABDOMEN PELVIS FINDINGS Hepatobiliary: Scattered probable cysts measuring up to 6 mm in segment 4 (series 2/image 45). Gallbladder is unremarkable. No intrahepatic or extrahepatic ductal dilatation. Pancreas: Within normal limits. Spleen: Within normal limits.  Adrenals/Urinary Tract: Adrenal glands are within normal limits. Subcentimeter renal cysts bilaterally.  No hydronephrosis. Bladder is within normal limits. Stomach/Bowel: Stomach is within normal limits. No evidence of bowel obstruction. Prior appendectomy. No colonic wall thickening or mass is evident on CT. Vascular/Lymphatic: No evidence of abdominal aortic aneurysm. Atherosclerotic calcifications of the abdominal aorta and branch vessels. No suspicious  abdominopelvic lymphadenopathy. Reproductive: Status post hysterectomy. No adnexal masses. Other: No abdominopelvic ascites. Musculoskeletal: Visualized osseous structures are within normal limits. Mild subcutaneous stranding along the anterior abdominal wall and bilateral gluteal regions, likely related to subcutaneous injection sites. IMPRESSION: Status post bilateral mastectomy and right axillary lymph node dissection. Status post thyroidectomy.  Status post hysterectomy. No evidence of recurrent or metastatic disease in this patient with history of multiple prior malignancies. Electronically Signed   By: Julian Hy M.D.   On: 12/29/2019 22:18    Labs:  CBC: Recent Labs    06/29/19 0040 06/29/19 0040 07/09/19 1806 11/30/19 1345 12/21/19 1418  WBC 7.5  --  7.1 8.3 8.4  HGB 12.4   < > 12.4 12.5 11.9*  12.0  HCT 39.0  --  39.7 38.8 37.5  PLT 264  --  318 305 286   < > = values in this interval not displayed.    COAGS: Recent Labs    07/09/19 1806  INR 0.9    BMP: Recent Labs    06/29/19 0040 07/09/19 1806 11/30/19 1345 12/21/19 1418  NA 138 138 141 140  K 3.9 3.9 3.3* 3.6  CL 105 107 104 106  CO2 22 21* 28 24  GLUCOSE 107* 118* 108* 137*  BUN _0 CALCIUM 9.0 8.8* 8.6* 8.4*  CREATININE 1.10* 0.96 1.06* 1.06*  GFRNONAA 59* >60 >60 >60  GFRAA >60 >60 >60 >60    LIVER FUNCTION TESTS: Recent Labs    07/09/19 1806 11/30/19 1345 12/21/19 1418  BILITOT 0.2* 0.3 0.3  AST 41 39 35  ALT 63* 36 32    ALKPHOS 55 60 64  PROT 7.4 7.5 7.2  ALBUMIN 4.1 3.7 3.6    TUMOR MARKERS: No results for input(s): AFPTM, CEA, CA199, CHROMGRNA in the last 8760 hours.  Assessment and Plan: Patient with past medical history of Hodgkin's lymphoma, breast cancer presents with complaint of LUE subcutaneous nodule found on bone scan.  IR consulted for biopsy at the request of Dr. Lindi Adie. Case reviewed by Dr. Vernard Gambles who approves patient for procedure.  Patient presents today in their usual state of health.  She has been NPO and is not currently on blood thinners.  Given history of Addison's, patient started on IV fluids for dehydration, ordered 100 mg hydrocortisone, and IV benadryl for anxiety/sedation.  Risks and benefits of subcutaneous nodule was discussed with the patient and/or patient's family including, but not limited to bleeding, infection, damage to adjacent structures or low yield requiring additional tests.  All of the questions were answered and there is agreement to proceed.  Consent signed and in chart.   Thank you for this interesting consult.  I greatly enjoyed meeting [name] and look forward to participating in their care.  A copy of this report was sent to the requesting provider on this date.  Electronically Signed: Docia Barrier, PA 01/13/2020, 8:52 AM   I spent a total of  30 Minutes   in face to face in clinical consultation, greater than 50% of which was counseling/coordinating care for subcutaneous nodule

## 2020-01-13 NOTE — Procedures (Signed)
  Procedure: [place] L arm no lesion to correlate with bone scan, biopsy deferred; consider MR+Gd to eval if needed EBL:   minimal Complications:  none immediate  See full dictation in [facility].  Dillard Cannon MD Main # [phone] Pager  [phone]

## 2020-01-13 NOTE — Sedation Documentation (Signed)
No focal area to biopsy at this via ultrasound per Dr. Vernard Gambles. With speak with physician about where to proceed from here.

## 2020-01-14 ENCOUNTER — Telehealth: Payer: Self-pay | Admitting: Hematology and Oncology

## 2020-01-14 NOTE — Telephone Encounter (Signed)
Scheduled appt per 4/15 sch message - pt is aware appt

## 2020-01-15 ENCOUNTER — Ambulatory Visit: Payer: BC Managed Care – PPO

## 2020-01-15 ENCOUNTER — Encounter: Payer: BC Managed Care – PPO | Admitting: Thoracic Surgery (Cardiothoracic Vascular Surgery)

## 2020-01-15 ENCOUNTER — Encounter: Payer: Self-pay | Admitting: Hematology and Oncology

## 2020-01-15 ENCOUNTER — Encounter: Payer: Self-pay | Admitting: *Deleted

## 2020-01-15 ENCOUNTER — Other Ambulatory Visit: Payer: Self-pay | Admitting: *Deleted

## 2020-01-15 DIAGNOSIS — R2232 Localized swelling, mass and lump, left upper limb: Secondary | ICD-10-CM

## 2020-01-15 NOTE — Progress Notes (Signed)
Per MD pt to receive MRI left upper arm with contrast to further evaluate subcu nodule.  Orders placed and pt notified.

## 2020-01-17 ENCOUNTER — Emergency Department (HOSPITAL_COMMUNITY)
Admission: EM | Admit: 2020-01-17 | Discharge: 2020-01-17 | Disposition: A | Discharge status: Home / Self Care | Payer: BC Managed Care – PPO | Attending: Emergency Medicine | Admitting: Emergency Medicine

## 2020-01-17 ENCOUNTER — Other Ambulatory Visit: Payer: Self-pay

## 2020-01-17 ENCOUNTER — Encounter (HOSPITAL_COMMUNITY): Payer: Self-pay | Admitting: Emergency Medicine

## 2020-01-17 ENCOUNTER — Emergency Department (HOSPITAL_COMMUNITY): Payer: BC Managed Care – PPO

## 2020-01-17 DIAGNOSIS — I509 Heart failure, unspecified: Secondary | ICD-10-CM | POA: Diagnosis not present

## 2020-01-17 DIAGNOSIS — Z8571 Personal history of Hodgkin lymphoma: Secondary | ICD-10-CM | POA: Insufficient documentation

## 2020-01-17 DIAGNOSIS — N200 Calculus of kidney: Secondary | ICD-10-CM

## 2020-01-17 DIAGNOSIS — Z853 Personal history of malignant neoplasm of breast: Secondary | ICD-10-CM | POA: Diagnosis not present

## 2020-01-17 DIAGNOSIS — R109 Unspecified abdominal pain: Secondary | ICD-10-CM | POA: Diagnosis not present

## 2020-01-17 DIAGNOSIS — I110 Hypertensive heart disease with heart failure: Secondary | ICD-10-CM | POA: Diagnosis not present

## 2020-01-17 DIAGNOSIS — Z79899 Other long term (current) drug therapy: Secondary | ICD-10-CM | POA: Diagnosis not present

## 2020-01-17 DIAGNOSIS — Z9981 Dependence on supplemental oxygen: Secondary | ICD-10-CM | POA: Diagnosis not present

## 2020-01-17 DIAGNOSIS — Z7982 Long term (current) use of aspirin: Secondary | ICD-10-CM | POA: Diagnosis not present

## 2020-01-17 DIAGNOSIS — N202 Calculus of kidney with calculus of ureter: Secondary | ICD-10-CM | POA: Diagnosis not present

## 2020-01-17 LAB — COMPREHENSIVE METABOLIC PANEL
ALT: 41 U/L (ref 0–44)
AST: 38 U/L (ref 15–41)
Albumin: 3.8 g/dL (ref 3.5–5.0)
Alkaline Phosphatase: 56 U/L (ref 38–126)
Anion gap: 12 (ref 5–15)
BUN: 13 mg/dL (ref 6–20)
CO2: 25 mmol/L (ref 22–32)
Calcium: 8.2 mg/dL — ABNORMAL LOW (ref 8.9–10.3)
Chloride: 104 mmol/L (ref 98–111)
Creatinine, Ser: 1.12 mg/dL — ABNORMAL HIGH (ref 0.44–1.00)
GFR calc Af Amer: >60 mL/min (ref >60)
GFR calc non Af Amer: 58 mL/min — ABNORMAL LOW (ref >60)
Glucose, Bld: 88 mg/dL (ref 70–99)
Potassium: 3.3 mmol/L — ABNORMAL LOW (ref 3.5–5.1)
Sodium: 141 mmol/L (ref 135–145)
Total Bilirubin: 0.3 mg/dL (ref 0.3–1.2)
Total Protein: 7.5 g/dL (ref 6.5–8.1)

## 2020-01-17 LAB — LIPASE, BLOOD: Lipase: 25 U/L (ref 11–51)

## 2020-01-17 LAB — CBC WITH DIFFERENTIAL/PLATELET
Abs Immature Granulocytes: 0.03 10*3/uL (ref 0.00–0.07)
Basophils Absolute: 0.1 10*3/uL (ref 0.0–0.1)
Basophils Relative: 1 %
Eosinophils Absolute: 0.1 10*3/uL (ref 0.0–0.5)
Eosinophils Relative: 1 %
HCT: 39.2 % (ref 36.0–46.0)
Hemoglobin: 12.6 g/dL (ref 12.0–15.0)
Immature Granulocytes: 0 %
Lymphocytes Relative: 27 %
Lymphs Abs: 2.1 10*3/uL (ref 0.7–4.0)
MCH: 29.6 pg (ref 26.0–34.0)
MCHC: 32.1 g/dL (ref 30.0–36.0)
MCV: 92.2 fL (ref 80.0–100.0)
Monocytes Absolute: 0.8 10*3/uL (ref 0.1–1.0)
Monocytes Relative: 10 %
Neutro Abs: 4.7 10*3/uL (ref 1.7–7.7)
Neutrophils Relative %: 61 %
Platelets: 311 10*3/uL (ref 150–400)
RBC: 4.25 MIL/uL (ref 3.87–5.11)
RDW: 14 % (ref 11.5–15.5)
WBC: 7.8 10*3/uL (ref 4.0–10.5)
nRBC: 0 % (ref 0.0–0.2)

## 2020-01-17 LAB — URINALYSIS, ROUTINE W REFLEX MICROSCOPIC
Bilirubin Urine: NEGATIVE
Glucose, UA: NEGATIVE mg/dL
Hgb urine dipstick: NEGATIVE
Ketones, ur: NEGATIVE mg/dL
Leukocytes,Ua: NEGATIVE
Nitrite: NEGATIVE
Protein, ur: NEGATIVE mg/dL
Specific Gravity, Urine: 1.009 (ref 1.005–1.030)
pH: 6 (ref 5.0–8.0)

## 2020-01-17 MED ORDER — HYDROMORPHONE HCL 1 MG/ML IJ SOLN
1.0000 mg | Freq: Once | INTRAMUSCULAR | Status: AC
  Administered 2020-01-17: 1 mg via INTRAVENOUS
  Filled 2020-01-17: qty 1

## 2020-01-17 MED ORDER — TAMSULOSIN HCL 0.4 MG PO CAPS: 0.4000 mg | ORAL_CAPSULE | Freq: Every day | ORAL | 0 refills | Status: AC

## 2020-01-17 MED ORDER — POTASSIUM CHLORIDE CRYS ER 20 MEQ PO TBCR
40.0000 meq | EXTENDED_RELEASE_TABLET | Freq: Once | ORAL | Status: AC
  Administered 2020-01-17: 40 meq via ORAL
  Filled 2020-01-17: qty 2

## 2020-01-17 MED ORDER — HYDROMORPHONE HCL 2 MG PO TABS: 2.0000 mg | ORAL_TABLET | Freq: Four times a day (QID) | ORAL | 0 refills | Status: DC | PRN

## 2020-01-17 MED ORDER — FENTANYL CITRATE (PF) 100 MCG/2ML IJ SOLN
50.0000 ug | Freq: Once | INTRAMUSCULAR | Status: AC
  Administered 2020-01-17: 50 ug via INTRAVENOUS
  Filled 2020-01-17: qty 2

## 2020-01-17 MED ORDER — KETOROLAC TROMETHAMINE 15 MG/ML IJ SOLN
15.0000 mg | Freq: Once | INTRAMUSCULAR | Status: AC
  Administered 2020-01-17: 15 mg via INTRAVENOUS
  Filled 2020-01-17: qty 1

## 2020-01-17 MED ORDER — DIPHENHYDRAMINE HCL 50 MG/ML IJ SOLN
25.0000 mg | Freq: Once | INTRAMUSCULAR | Status: AC
  Administered 2020-01-17: 25 mg via INTRAVENOUS
  Filled 2020-01-17: qty 1

## 2020-01-17 MED ORDER — FLUCONAZOLE 150 MG PO TABS: 150.0000 mg | ORAL_TABLET | Freq: Every day | ORAL | 0 refills | Status: AC

## 2020-01-17 MED ORDER — HEPARIN SOD (PORK) LOCK FLUSH 100 UNIT/ML IV SOLN
500.0000 [IU] | Freq: Once | INTRAVENOUS | Status: AC
  Administered 2020-01-17: 500 [IU]
  Filled 2020-01-17: qty 5

## 2020-01-17 NOTE — Discharge Instructions (Addendum)
You have been diagnosed today with Kidney Stone.  At this time there does not appear to be the presence of an emergent medical condition, however there is always the potential for conditions to change. Please read and follow the below instructions.  Please return to the Emergency Department immediately for any new or worsening symptoms or if your symptoms do not improve within 2 days. Please be sure to follow up with your Primary Care Provider within one week regarding your visit today; please call their office to schedule an appointment even if you are feeling better for a follow-up visit. Your CT scan today showed a 5 mm nonobstructing kidney stone at the left ureterovesical junction.  This is likely the cause of your pain today.  You may use the pain medication Dilaudid as prescribed for severe pain.  Dilaudid may make you drowsy so do not drive or perform any dangerous activities after taking Dilaudid.  Do not drink alcohol with Dilaudid as this will worsen side effects.  You may also use the medication Flomax as prescribed to help facilitate stone passage. You have been given an NSAID-containing medication called Toradol today.  Do not take the medications including ibuprofen, Aleve, Advil, naproxen or other NSAID-containing medications for the next 2 days.  Please be sure to drink plenty of water over the next few days. Your CT scan also showed incidental findings today which include 2 small nonobstructing kidney stones in the left kidney, a 5 mm exophytic mass in the posterior aspect of the lower left kidney, a nodule in the right hemidiaphragm, multiple pelvic phleboliths, minimal arthromitosis arterial calcifications, degenerative changes of the spine.  Please discuss these incidental findings with your primary care provider at your follow-up visit. You may use the Diflucan as prescribed to help with yeast infections. Please call the urologist tomorrow to schedule a follow-up appointment.  Get  help right away if: You have a fever or chills. You get very bad pain. You get new pain in your belly (abdomen). You pass out (faint). You cannot pee. You have any new/concerning or worsening of symptoms  Please read the additional information packets attached to your discharge summary.  Do not take your medicine if  develop an itchy rash, swelling in your mouth or lips, or difficulty breathing; call 911 and seek immediate emergency medical attention if this occurs.  Note: Portions of this text may have been transcribed using voice recognition software. Every effort was made to ensure accuracy; however, inadvertent computerized transcription errors may still be present.

## 2020-01-17 NOTE — ED Provider Notes (Signed)
[facility] DEPT Provider Note   CSN: [id number] Arrival date & time: 01/17/20  1408     History Chief Complaint  Patient presents with  . Urinary Retention  . Abdominal Pain    [name] is a 49 y.o. female history of Addison's disease, aortic stenosis, breast cancer, cervical cancer, CHF, GERD, Hodgkin's lymphoma, hypertension, nonischemic cardiomyopathy, Raynaud's phenomenon, necrotizing fasciitis, Port-A-Cath.  Patient presents today for decreased urination and left flank pain that began this morning.  Patient reports that she has had decreased urinary output for the past several days.  She reports that she woke up this morning with a severe left flank pain a gnawing ache constant severe no clear aggravating or alleviating factors, no radiation of pain.  She reports history of kidney stones reports that this feels similar.  She reports that she was able to urinate a small amount last night around 11 PM but has not had any urination since that time.  Associated with nausea without vomiting.  Denies fever/chills,, headache, chest pain/shortness of breath, pleurisy, abdominal pain, dysuria/hematuria, abnormal extremity swelling, extremity pain or any additional concerns. HPI     Past Medical History:  Diagnosis Date  . Addison's disease ([place])   . Adrenal insufficiency ([place])   . Anemia   . Anxiety   . Aortic stenosis   . Aortic stenosis   . Appendicitis 12/19/09  . Appendicitis   . Breast cancer ([place])    STATUS POST BILATERAL MASTECTOMY. STATUS POST RECONSTRUCTION. SHE HAD SILICONE BREAST IMPLANTS AND THE LEFT IMPLANT IS LEAKING SLIGHTLY  . Cellulitis of right middle finger 11/07/2018  . Cervical cancer ([place]) 12/23/2018  . Chest pain   . CHF with right heart failure ([place]) 04/17/2017  . Chronic respiratory failure with hypoxia ([place]) 12/23/2018  . Cough variant asthma 04/13/2019  . Depression   . GERD (gastroesophageal reflux disease)   . Headache     migraines  . Heart murmur   . History of kidney stones   . Hodgkin lymphoma ([facility])    STATUS POST MANTLE RADIATION  . Hodgkin's lymphoma ([place])    1987  . Hypertension   . Hypoxia   . Necrotizing fasciitis ([place]) 12/23/2018  . Non-ischemic cardiomyopathy ([place])   . Osteoporosis   . Palpitations   . Pituitary adenoma ([place]) 12/23/2018  . Pneumonia   . PONV (postoperative nausea and vomiting)   . Pre-diabetes    per pt; no meds  . Pulmonary hypertension ([place]) 12/23/2018  . Raynaud phenomenon   . Right heart failure ([place]) 04/17/2017  . Supplemental oxygen dependent    3 liters  . SVT (supraventricular tachycardia) ([place])   . Tachycardia   . Thyroid cancer ([place])    STATUS POST SURGICAL REMOVAL-CURRENT ON THYROID REPLACEMENT    Patient Active Problem List   Diagnosis Date Noted  . Port-A-Cath in place 09/09/2019  . Moderate persistent asthma without complication 95/06/3266  . Acute superficial venous thrombosis of lower extremity, right 07/16/2019  . Injury of left index finger 12/24/2018  . Pituitary adenoma ([place]) 12/23/2018  . History of cervical cancer 12/23/2018  . Chronic respiratory failure with hypoxia ([place]) 12/23/2018  . SOB (shortness of breath)   . Nephrolithiasis 11/07/2018  . Oxygen dependent 11/07/2018  . Migraine 11/07/2018  . PVC's (premature ventricular contractions) 10/27/2018  . Hyperlipidemia 03/09/2014  . Hx of Hodgkins lymphoma   . History of ductal carcinoma in situ (DCIS) of breast   . Thyroid cancer ([place])   .  Adrenal insufficiency ([place])   . Hypertension   . Raynaud phenomenon   . GERD (gastroesophageal reflux disease)     Past Surgical History:  Procedure Laterality Date  . ABDOMINAL HYSTERECTOMY    . AMPUTATION Left 01/30/2019   Procedure: Left Index finger amputation with flap reconstruction and repair reconstruction;  Surgeon: Roseanne Kaufman, MD;  Location: [facility];  Service: Orthopedics;  Laterality: Left;  . APPENDECTOMY    . CARDIAC  CATHETERIZATION  05/18/09   NORMAL CATH  . I & D EXTREMITY Left 12/23/2018   Procedure: IRRIGATION AND DEBRIDEMENT HAND / INDEX FINGER;  Surgeon: Roseanne Kaufman, MD;  Location: [facility];  Service: Orthopedics;  Laterality: Left;  Marland Kitchen MASTECTOMY    . PITUITARY SURGERY    . RIGHT/LEFT HEART CATH AND CORONARY ANGIOGRAPHY N/A 04/02/2018   Procedure: RIGHT/LEFT HEART CATH AND CORONARY ANGIOGRAPHY;  Surgeon: Burnell Blanks, MD;  Location: [facility];  Service: Cardiovascular;  Laterality: N/A;  . TOTAL THYROIDECTOMY    . VIDEO BRONCHOSCOPY Bilateral 11/14/2018   Procedure: VIDEO BRONCHOSCOPY WITHOUT FLUORO;  Surgeon: Margaretha Seeds, MD;  Location: [facility];  Service: Cardiopulmonary;  Laterality: Bilateral;  . VIDEO BRONCHOSCOPY WITH ENDOBRONCHIAL ULTRASOUND N/A 11/19/2018   Procedure: VIDEO BRONCHOSCOPY WITH ENDOBRONCHIAL ULTRASOUND;  Surgeon: Margaretha Seeds, MD;  Location: [facility];  Service: Thoracic;  Laterality: N/A;     OB History   No obstetric history on file.     Family History  Family history unknown: Yes    Social History   Tobacco Use  . Smoking status: Never Smoker  . Smokeless tobacco: Never Used  Substance Use Topics  . Alcohol use: Yes    Comment: social   . Drug use: No    Home Medications Prior to Admission medications   Medication Sig Start Date End Date Taking? Authorizing Provider  albuterol (2.5 MG/3ML) 0.083% NEBU 3 mL, albuterol (5 MG/ML) 0.5% NEBU 0.5 mL Inhale 3 mg into the lungs every 6 (six) hours as needed (SOB).    Yes [provider]  albuterol (VENTOLIN HFA) 108 (90 Base) MCG/ACT inhaler Inhale 2 puffs into the lungs every 6 (six) hours as needed for wheezing or shortness of breath.   Yes [provider]  Alum & Mag Hydroxide-Simeth (GI COCKTAIL) SUSP suspension Take 30 mLs daily as needed by mouth for indigestion (BLEEDING ULCERS). Shake well.   Yes [provider]  aspirin 325 MG tablet Take 325 mg by mouth  at bedtime.    Yes [provider]  beclomethasone (QVAR) 80 MCG/ACT inhaler Inhale 1 puff into the lungs 2 (two) times daily.    Yes [provider]  clonazePAM (KLONOPIN) 1 MG tablet Take 1 mg by mouth 2 (two) times daily.    Yes [provider]  colchicine 0.6 MG tablet Take 0.6 mg by mouth daily in the afternoon.  12/31/19  Yes [provider]  dexlansoprazole (DEXILANT) 60 MG capsule Take 60 mg by mouth daily.     Yes [provider]  diphenhydrAMINE (BENADRYL) 50 MG/ML injection Inject 1 mL (50 mg total) into the vein every 6 (six) hours as needed (nausea). 12/25/18  Yes Rai, Ripudeep K, MD  EPINEPHRINE 0.3 mg/0.3 mL IJ SOAJ injection INJECT 0.3 MLS (0.3 MG TOTAL) INTO THE MUSCLE AS NEEDED FOR ANAPHYLAXIS. 12/09/19  Yes Margaretha Seeds, MD  escitalopram (LEXAPRO) 20 MG tablet Take 20 mg by mouth every evening.  04/24/16  Yes [provider]  estradiol (ESTRACE) 2 MG tablet Take 2 mg by mouth every evening.  02/01/16  Yes [provider]  fluticasone furoate-vilanterol (BREO ELLIPTA) 200-25 MCG/INH AEPB Inhale 1 puff into the lungs daily.   Yes [provider]  furosemide (LASIX) 40 MG tablet Take 40 mg by mouth daily.    Yes [provider]  Galcanezumab-gnlm 120 MG/ML SOAJ Inject 120 mg into the skin every 28 (twenty-eight) days.  03/31/19  Yes [provider]  hydrocortisone (CORTEF) 10 MG tablet Take 10-20 mg by mouth See admin instructions. Take 20 mg in the am and 26m in the evening   Yes [provider]  levothyroxine (SYNTHROID, LEVOTHROID) 112 MCG tablet Take 112 mcg by mouth daily before breakfast.   Yes [provider]  LORazepam (ATIVAN) 0.5 MG tablet TAKE 1 TO 2 TABLETS BY MOUTH TWICE A DAY AS NEEDED FOR NAUSEA Patient taking differently: Take 0.5-1 mg by mouth 2 (two) times daily as needed (Nausea).  01/04/20  Yes GNicholas Lose MD  Mepolizumab (NUCALA) 100 MG SOLR Inject 100 mg  into the skin every 28 (twenty-eight) days. 05/13/19  Yes EMargaretha Seeds MD  metoprolol succinate (TOPROL-XL) 25 MG 24 hr tablet Take 37.5 mg by mouth daily.    Yes [provider]  oxyCODONE-acetaminophen (PERCOCET/ROXICET) 5-325 MG tablet Take 1 tablet by mouth every 8 (eight) hours as needed for severe pain. 01/05/20  Yes GNicholas Lose MD  OXYGEN Inhale 4 L into the lungs continuous.    Yes [provider]  Rimegepant Sulfate (NURTEC) 75 MG TBDP Take 75 mg by mouth daily as needed (Migraine).    Yes [provider]  rosuvastatin (CRESTOR) 10 MG tablet Take 10 mg by mouth daily.  02/28/18  Yes [provider]  silver sulfADIAZINE (SILVADENE) 1 % cream Apply 1 application topically daily as needed (Open Wound).  11/16/19  Yes [provider]  sucralfate (CARAFATE) 1 g tablet Take 1 g by mouth daily as needed (FOR ULCERS).    Yes [provider]  topiramate (TOPAMAX) 50 MG tablet Take 150 mg by mouth at bedtime.  12/25/19  Yes [provider]  traMADol (ULTRAM) 50 MG tablet Take 50 mg by mouth daily as needed (migraine).  06/28/16  Yes [provider]  zolpidem (AMBIEN CR) 12.5 MG CR tablet Take 12.5 mg by mouth at bedtime.     Yes [provider]  benzonatate (TESSALON) 200 MG capsule Take 1 capsule (200 mg total) by mouth 3 (three) times daily as needed for cough. Patient not taking: Reported on 01/17/2020 04/15/19   WMartyn Ehrich NP  fluconazole (DIFLUCAN) 150 MG tablet Take 1 tablet (150 mg total) by mouth daily for 1 day. 01/17/20 01/18/20  MNuala AlphaA, PA-C  HYDROmorphone (DILAUDID) 2 MG tablet Take 1 tablet (2 mg total) by mouth every 6 (six) hours as needed for severe pain. 01/17/20   MNuala AlphaA, PA-C  tamsulosin (FLOMAX) 0.4 MG CAPS capsule Take 1 capsule (0.4 mg total) by mouth daily after breakfast for 5 days. 01/17/20 01/22/20  MNuala AlphaA, PA-C    Allergies    Mushroom extract complex,  Na ferric gluc cplx in sucrose, Cymbalta [duloxetine hcl], Succinylcholine, Buprenorphine hcl, Compazine, Metoclopramide, Morphine and related, Ondansetron, Promethazine hcl, and Tegaderm ag mesh [silver]  Review of Systems   Review of Systems Ten systems are reviewed and are negative for acute change except as noted in the HPI  Physical Exam Updated Vital  Signs BP 135/90 (BP Location: Right Arm)   Pulse 74   Temp 98.6 F (37 C) (Oral)   Resp 18   Ht _0  (1.651 m)   Wt 85.3 kg   LMP  (LMP Unknown)   SpO2 100%   BMI 31.28 kg/m   Physical Exam Constitutional:      General: She is not in acute distress.    Appearance: Normal appearance. She is well-developed. She is not ill-appearing or diaphoretic.  HENT:     Head: Normocephalic and atraumatic.     Right Ear: External ear normal.     Left Ear: External ear normal.     Nose: Nose normal.  Eyes:     General: Vision grossly intact. Gaze aligned appropriately.     Pupils: Pupils are equal, round, and reactive to light.  Neck:     Trachea: Trachea and phonation normal. No tracheal deviation.  Cardiovascular:     Pulses:          Dorsalis pedis pulses are 2+ on the right side and 2+ on the left side.  Pulmonary:     Effort: Pulmonary effort is normal. No respiratory distress.  Abdominal:     General: There is no distension.     Palpations: Abdomen is soft.     Tenderness: There is generalized abdominal tenderness. There is left CVA tenderness. There is no right CVA tenderness, guarding or rebound.  Musculoskeletal:        General: Normal range of motion.     Cervical back: Normal range of motion.  Feet:     Right foot:     Protective Sensation: 3 sites tested. 3 sites sensed.     Left foot:     Protective Sensation: 3 sites tested. 3 sites sensed.  Skin:    General: Skin is warm and dry.  Neurological:     Mental Status: She is alert.     GCS: GCS eye subscore is 4. GCS verbal subscore is 5. GCS motor subscore is 6.      Comments: Speech is clear and goal oriented, follows commands Major Cranial nerves without deficit, no facial droop Moves extremities without ataxia, coordination intact  Psychiatric:        Behavior: Behavior normal.     ED Results / Procedures / Treatments   Labs (all labs ordered are listed, but only abnormal results are displayed) Labs Reviewed  COMPREHENSIVE METABOLIC PANEL - Abnormal; Notable for the following components:      Result Value   Potassium 3.3 (*)    Creatinine, Ser 1.12 (*)    Calcium 8.2 (*)    GFR calc non Af Amer 58 (*)    All other components within normal limits  URINALYSIS, ROUTINE W REFLEX MICROSCOPIC - Abnormal; Notable for the following components:   Color, Urine STRAW (*)    All other components within normal limits  URINE CULTURE  CBC WITH DIFFERENTIAL/PLATELET  LIPASE, BLOOD    EKG None  Radiology CT Renal Stone Study  Result Date: 01/17/2020 CLINICAL DATA:  Left frank pain. History of nephrolithiasis. History of lymphoma, breast cancer, cervical cancer, thyroid cancer and melanoma. EXAM: CT ABDOMEN AND PELVIS WITHOUT CONTRAST TECHNIQUE: Multidetector CT imaging of the abdomen and pelvis was performed following the standard protocol without IV contrast. COMPARISON:  12/29/2019 FINDINGS: Lower chest: Stable bilateral breast implants. Normal sized heart. The previously demonstrated fat containing nodule along the right hemidiaphragm at the right lung base has a more medium  density appearance today. This measures 11 x 8 mm on image number 22 series 4, unchanged. No parenchymal lung nodules are seen. No pleural fluid. Hepatobiliary: No significant change in small liver cysts. Normal appearing gallbladder. Pancreas: Unremarkable. No pancreatic ductal dilatation or surrounding inflammatory changes. Spleen: Normal in size without focal abnormality. Adrenals/Urinary Tract: Normal appearing adrenal glands. 4 mm upper pole left renal calculus. Additional tiny  lower pole left renal calculus. 5 mm exophytic mass arising from the posterior aspect of the mid to lower left kidney. Unremarkable right kidney. Interval 5 mm calculus at the left ureterovesical junction without dilatation of the left renal collecting system or ureter. Stable multiple bilateral pelvic phleboliths. Stomach/Bowel: Unremarkable stomach, small bowel and colon. Surgically absent appendix. Vascular/Lymphatic: Minimal atheromatous arterial calcifications. No enlarged lymph nodes. Reproductive: Status post hysterectomy. No adnexal masses. Other: No abdominal wall hernia or abnormality. No abdominopelvic ascites. Musculoskeletal: Minimal lumbar and lower thoracic spine degenerative changes. No evidence of bony metastatic disease. IMPRESSION: 1. Interval 5 mm nonobstructing calculus at the left ureterovesical junction. 2. Two small nonobstructing left renal calculi. 3. 5 mm exophytic mass arising from the posterior aspect of the mid to lower left kidney. This could represent a small solid mass or proteinaceous cyst. 4. Stable 11 x 8 mm nodule along the right hemidiaphragm except no associated fat density currently demonstrated. Electronically Signed   By: Claudie Revering M.D.   On: 01/17/2020 15:58    Procedures Procedures (including critical care time)  Medications Ordered in ED Medications  heparin lock flush 100 unit/mL (has no administration in time range)  potassium chloride SA (KLOR-CON) CR tablet 40 mEq (has no administration in time range)  fentaNYL (SUBLIMAZE) injection 50 mcg (50 mcg Intravenous Given 01/17/20 1500)  diphenhydrAMINE (BENADRYL) injection 25 mg (25 mg Intravenous Given 01/17/20 1510)  fentaNYL (SUBLIMAZE) injection 50 mcg (50 mcg Intravenous Given 01/17/20 1627)  HYDROmorphone (DILAUDID) injection 1 mg (1 mg Intravenous Given 01/17/20 1726)  ketorolac (TORADOL) 15 MG/ML injection 15 mg (15 mg Intravenous Given 01/17/20 1726)    ED Course  I have reviewed the triage vital  signs and the nursing notes.  Pertinent labs & imaging results that were available during my care of the patient were reviewed by me and considered in my medical decision making (see chart for details).    MDM Rules/Calculators/A&P                     49 year old female with history as detailed above presents today with left flank pain, decreased urination emesis.  On arrival she is uncomfortable but in no acute distress vital signs stable and on normal at 3.5 L O2 via nasal cannula.  I have ordered CBC, CMP, lipase, urinalysis and urine culture.  I have ordered CT renal stone study.  Patient neurovascular intact to all 4 extremities.  I have ordered 50 mcg fentanyl for pain control. - I have ordered reviewed and interpreted the following labs: CBC within normal limits no leukocytosis to suggest infection no evidence of anemia. CMP shows mild hypokalemia of 3.3 suspect secondary to decreased p.o. intake and emesis, creatinine 1.12 which appears near patient's baseline, calcium 8.2.  No emergent electrolyte derangement or acute elevation of LFTs. Lipase within normal limits doubt pancreatitis. Urinalysis straw-colored otherwise within normal limits no evidence of infection.  CT renal stone study:  IMPRESSION:  1. Interval 5 mm nonobstructing calculus at the left ureterovesical  junction.  2. Two small nonobstructing left renal calculi.  3. 5 mm exophytic mass arising from the posterior aspect of the mid  to lower left kidney. This could represent a small solid mass or  proteinaceous cyst.  4. Stable 11 x 8 mm nodule along the right hemidiaphragm except no  associated fat density currently demonstrated.  - Patient was seen and evaluated by Dr. Sedonia Small during this visit.  Additional 50 mcg fentanyl was ordered for pain control.  Plan is discharge after pain control. - I reevaluated the patient she reports that fentanyl is helping with her pain however pain quickly returns.  I have ordered 1 mg  Dilaudid as well as 15 mg Toradol for pain control secondary to kidney stone. - 6:30 PM: Patient reevaluated resting comfortably in bed and her husband is at bedside.  She reports improvement of pain and would like to be discharged at this time.  Plan of care is Flomax, pain control and urology follow-up.  Patient is requesting fluconazole at discharge for concern of yeast infection which will be prescribed.  Patient aware to call urologist office tomorrow morning.  PDMP reviewed patient's most recent narcotic prescription was on January 05, 2020 for 20 pills of Percocet which patient reports she is currently out of.  As this was a 7-day supply do feel it is reasonable at this time to give patient short course of pain medication for home.  Patient did report allergy to all pain medications except for fentanyl and Dilaudid earlier. Four (4) pills of (2 mg) Dilaudid prescribed for severe pain secondary to kidney stone, precautions regarding narcotics reviewed and patient and her husband state understanding.  At this time there does not appear to be any evidence of an acute emergency medical condition and the patient appears stable for discharge with appropriate outpatient follow up. Diagnosis was discussed with patient who verbalizes understanding of care plan and is agreeable to discharge. I have discussed return precautions with patient and husband who verbalizes understanding. Patient encouraged to follow-up with their PCP and Urology. All questions answered.  Patient's case discussed with Dr.  Sedonia Small who agrees with plan to discharge with follow-up.   Note: Portions of this report may have been transcribed using voice recognition software. Every effort was made to ensure accuracy; however, inadvertent computerized transcription errors may still be present.  Final Clinical Impression(s) / ED Diagnoses Final diagnoses:  Kidney stone    Rx / DC Orders ED Discharge Orders         Ordered    tamsulosin  (FLOMAX) 0.4 MG CAPS capsule  Daily after breakfast     01/17/20 1904    HYDROmorphone (DILAUDID) 2 MG tablet  Every 6 hours PRN     01/17/20 1904    fluconazole (DIFLUCAN) 150 MG tablet  Daily     01/17/20 1910           Gari Crown 01/17/20 1910    Maudie Flakes, MD 01/19/20 [phone]

## 2020-01-17 NOTE — ED Notes (Signed)
Pt verbalized d/c instructions and follow up care. Alert and ambulatory. Port deaccessed. Leaving with s/o

## 2020-01-17 NOTE — ED Triage Notes (Signed)
Complains of not being able pee since last night, endorses abd distention. Bladder scan showered 111 mL and 137 mL. Patient is a cancer pt, also hx of kidney stones

## 2020-01-17 NOTE — ED Notes (Signed)
PT placed on 3.5 Liters of O2 via nasal cannula due to PT is on baseline O2 at home, with portable concentrator.

## 2020-01-18 ENCOUNTER — Encounter: Payer: Self-pay | Admitting: Hematology and Oncology

## 2020-01-18 LAB — URINE CULTURE: Culture: NO GROWTH

## 2020-01-19 ENCOUNTER — Telehealth: Payer: Self-pay | Admitting: Hematology and Oncology

## 2020-01-19 ENCOUNTER — Other Ambulatory Visit: Payer: Self-pay

## 2020-01-19 DIAGNOSIS — N2889 Other specified disorders of kidney and ureter: Secondary | ICD-10-CM

## 2020-01-19 NOTE — Telephone Encounter (Signed)
Patient sent this message to you this afternoon.      Dr. Loanne Drilling,   Hi there. I hope you are doing well. I was hoping you could take a look at my latest scans and give you input. It seems there are lesions on my sternum and left arm. However, I ended up at the ER this past Sunday with a kidney stone, and the renal scan showed the stone in addition to a mass on the back of the left kidney.   The radiologist also made note of the change in appearance and density in the lung nodule near the right hemidiaphragmatic area, that we have been watching. Stating that it no longer had a fatty type density. Thought you may want to take a look.   Hope you and your family are doing well,  [name]  [phone]    Message routed to Dr Loanne Drilling

## 2020-01-19 NOTE — Telephone Encounter (Signed)
Telephone note: Discussed all the findings on scans and reviewed the plan - humerus: MRI planned - kidney lesion: Urology tomorrow - sternal lesion: Will need to see CT surgery Patient wanted me to mention that the note in the emergency room record that she ran out of her pain pills incorrect.  She still has most of the pain medicine that I prescribed -2 tablets.

## 2020-01-20 ENCOUNTER — Telehealth: Payer: BC Managed Care – PPO | Admitting: Hematology and Oncology

## 2020-01-20 DIAGNOSIS — R1084 Generalized abdominal pain: Secondary | ICD-10-CM | POA: Diagnosis not present

## 2020-01-20 DIAGNOSIS — N201 Calculus of ureter: Secondary | ICD-10-CM | POA: Diagnosis not present

## 2020-01-20 DIAGNOSIS — D49512 Neoplasm of unspecified behavior of left kidney: Secondary | ICD-10-CM | POA: Diagnosis not present

## 2020-01-21 ENCOUNTER — Other Ambulatory Visit: Payer: Self-pay | Admitting: Urology

## 2020-01-21 DIAGNOSIS — D49512 Neoplasm of unspecified behavior of left kidney: Secondary | ICD-10-CM

## 2020-01-22 ENCOUNTER — Other Ambulatory Visit: Payer: Self-pay | Admitting: Hematology and Oncology

## 2020-01-22 DIAGNOSIS — R110 Nausea: Secondary | ICD-10-CM

## 2020-01-25 ENCOUNTER — Encounter: Payer: Self-pay | Admitting: Hematology and Oncology

## 2020-01-27 DIAGNOSIS — R911 Solitary pulmonary nodule: Secondary | ICD-10-CM | POA: Diagnosis not present

## 2020-01-29 ENCOUNTER — Inpatient Hospital Stay: Payer: BC Managed Care – PPO

## 2020-01-29 ENCOUNTER — Other Ambulatory Visit: Payer: Self-pay

## 2020-01-29 ENCOUNTER — Ambulatory Visit (HOSPITAL_COMMUNITY)
Admission: RE | Admit: 2020-01-29 | Discharge: 2020-01-29 | Disposition: A | Discharge status: Home / Self Care | Payer: BC Managed Care – PPO | Source: Ambulatory Visit | Attending: Hematology and Oncology | Admitting: Hematology and Oncology

## 2020-01-29 DIAGNOSIS — R2232 Localized swelling, mass and lump, left upper limb: Secondary | ICD-10-CM | POA: Insufficient documentation

## 2020-01-29 DIAGNOSIS — Z452 Encounter for adjustment and management of vascular access device: Secondary | ICD-10-CM | POA: Diagnosis not present

## 2020-01-29 DIAGNOSIS — R600 Localized edema: Secondary | ICD-10-CM | POA: Diagnosis not present

## 2020-01-29 DIAGNOSIS — C50912 Malignant neoplasm of unspecified site of left female breast: Secondary | ICD-10-CM | POA: Diagnosis not present

## 2020-01-29 DIAGNOSIS — Z95828 Presence of other vascular implants and grafts: Secondary | ICD-10-CM

## 2020-01-29 DIAGNOSIS — Z86718 Personal history of other venous thrombosis and embolism: Secondary | ICD-10-CM | POA: Diagnosis not present

## 2020-01-29 MED ORDER — HEPARIN SOD (PORK) LOCK FLUSH 100 UNIT/ML IV SOLN
500.0000 [IU] | Freq: Once | INTRAVENOUS | Status: AC | PRN
  Administered 2020-01-29: 500 [IU]
  Filled 2020-01-29: qty 5

## 2020-01-29 MED ORDER — GADOBUTROL 1 MMOL/ML IV SOLN
10.0000 mL | Freq: Once | INTRAVENOUS | Status: AC | PRN
  Administered 2020-01-29: 10 mL via INTRAVENOUS

## 2020-01-29 MED ORDER — SODIUM CHLORIDE 0.9% FLUSH
10.0000 mL | INTRAVENOUS | Status: DC | PRN
  Administered 2020-01-29: 10 mL
  Filled 2020-01-29: qty 10

## 2020-01-31 NOTE — Progress Notes (Signed)
Patient Care Team: Toppin, Antionette Poles, MD as PCP - General (Internal Medicine) Nahser, Wonda Cheng, MD as PCP - Cardiology (Cardiology) Maylon Peppers, MD (Family Medicine)  DIAGNOSIS:    ICD-10-CM   1. Hx of Hodgkins lymphoma  Z85.71   2. Thyroid cancer ([place])  C73   3. Acute superficial venous thrombosis of lower extremity, right  I82.811   4. History of cervical cancer  Z85.41   5. Subcutaneous nodule  R22.9     SUMMARY OF ONCOLOGIC HISTORY: Oncology History  Hx of Hodgkins lymphoma   Initial Diagnosis   Hx of Hodgkins lymphoma    Radiation Therapy     Thyroid cancer ([place])   Initial Diagnosis   Thyroid cancer ([facility])    Remission      Remission      Radiation Therapy     History of cervical cancer  12/23/2018 Initial Diagnosis   History of cervical cancer    Remission      Radiation Therapy       CHIEF COMPLIANT: Follow-up of manubriosternal lesion and left arm nodule   INTERVAL HISTORY: [name] is a 49 y.o. 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 Upper left arm [place] on 01/13/20 showed no lesion to correlate with the bone scan. MRI on 01/29/20 showed mild subcutaneous edema due to inflammation or fat necrosis. She presents to the clinic today for follow-up.  The pain in the chest continues to be a problem.  When she takes 1 Percocet per day it gives her relief.  She tried all TRAM and Motrin but none of them seem to be working.  ALLERGIES:  is allergic to mushroom extract complex; na ferric gluc cplx in sucrose; cymbalta [duloxetine hcl]; succinylcholine; buprenorphine hcl; compazine; metoclopramide; morphine and related; ondansetron; promethazine hcl; and tegaderm ag mesh [silver].  MEDICATIONS:  Current Outpatient Medications  Medication Sig Dispense Refill  . albuterol (2.5 MG/3ML) 0.083% NEBU 3 mL, albuterol (5 MG/ML) 0.5% NEBU 0.5 mL Inhale 3 mg into the lungs  every 6 (six) hours as needed (SOB).     Marland Kitchen albuterol (VENTOLIN HFA) 108 (90 Base) MCG/ACT inhaler Inhale 2 puffs into the lungs every 6 (six) hours as needed for wheezing or shortness of breath.    . Alum & Mag Hydroxide-Simeth (GI COCKTAIL) SUSP suspension Take 30 mLs daily as needed by mouth for indigestion (BLEEDING ULCERS). Shake well.    . aspirin 325 MG tablet Take 325 mg by mouth at bedtime.     . beclomethasone (QVAR) 80 MCG/ACT inhaler Inhale 1 puff into the lungs 2 (two) times daily.     . benzonatate (TESSALON) 200 MG capsule Take 1 capsule (200 mg total) by mouth 3 (three) times daily as needed for cough. (Patient not taking: Reported on 01/17/2020) 45 capsule 1  . clonazePAM (KLONOPIN) 1 MG tablet Take 1 mg by mouth 2 (two) times daily.     . colchicine 0.6 MG tablet Take 0.6 mg by mouth daily in the afternoon.     Marland Kitchen dexlansoprazole (DEXILANT) 60 MG capsule Take 60 mg by mouth daily.      . diphenhydrAMINE (BENADRYL) 50 MG/ML injection Inject 1 mL (50 mg total) into the vein every 6 (six) hours as needed (nausea). 25 mL 0  . EPINEPHRINE 0.3 mg/0.3 mL IJ SOAJ injection INJECT 0.3 MLS (0.3 MG TOTAL) INTO THE MUSCLE AS NEEDED FOR ANAPHYLAXIS. 2 each 3  .  escitalopram (LEXAPRO) 20 MG tablet Take 20 mg by mouth every evening.   1  . estradiol (ESTRACE) 2 MG tablet Take 2 mg by mouth every evening.   11  . fluticasone furoate-vilanterol (BREO ELLIPTA) 200-25 MCG/INH AEPB Inhale 1 puff into the lungs daily.    . furosemide (LASIX) 40 MG tablet Take 40 mg by mouth daily.     . Galcanezumab-gnlm 120 MG/ML SOAJ Inject 120 mg into the skin every 28 (twenty-eight) days.     . hydrocortisone (CORTEF) 10 MG tablet Take 10-20 mg by mouth See admin instructions. Take 20 mg in the am and 18m in the evening    . HYDROmorphone (DILAUDID) 2 MG tablet Take 1 tablet (2 mg total) by mouth every 6 (six) hours as needed for severe pain. 4 tablet 0  . levothyroxine (SYNTHROID, LEVOTHROID) 112 MCG tablet Take  112 mcg by mouth daily before breakfast.    . LORazepam (ATIVAN) 0.5 MG tablet TAKE 1 TO 2 TABLETS BY MOUTH TWICE A DAY AS NEEDED FOR NAUSEA 30 tablet 0  . Mepolizumab (NUCALA) 100 MG SOLR Inject 100 mg into the skin every 28 (twenty-eight) days. 1 each 3  . metoprolol succinate (TOPROL-XL) 25 MG 24 hr tablet Take 37.5 mg by mouth daily.     .Marland KitchenoxyCODONE-acetaminophen (PERCOCET/ROXICET) 5-325 MG tablet Take 1 tablet by mouth every 8 (eight) hours as needed for severe pain. 20 tablet 0  . OXYGEN Inhale 4 L into the lungs continuous.     . Rimegepant Sulfate (NURTEC) 75 MG TBDP Take 75 mg by mouth daily as needed (Migraine).     . rosuvastatin (CRESTOR) 10 MG tablet Take 10 mg by mouth daily.     . silver sulfADIAZINE (SILVADENE) 1 % cream Apply 1 application topically daily as needed (Open Wound).     . sucralfate (CARAFATE) 1 g tablet Take 1 g by mouth daily as needed (FOR ULCERS).     .Marland Kitchentopiramate (TOPAMAX) 50 MG tablet Take 150 mg by mouth at bedtime.     . traMADol (ULTRAM) 50 MG tablet Take 50 mg by mouth daily as needed (migraine).     . zolpidem (AMBIEN CR) 12.5 MG CR tablet Take 12.5 mg by mouth at bedtime.       Current Facility-Administered Medications  Medication Dose Route Frequency Provider Last Rate Last Admin  . Mepolizumab SOLR 100 mg  100 mg Subcutaneous Q28 days EMargaretha Seeds MD   100 mg at 08/31/19 1017    PHYSICAL EXAMINATION: ECOG PERFORMANCE STATUS: 1 - Symptomatic but completely ambulatory  Vitals:   02/01/20 0909  BP: 115/76  Pulse: 90  Resp: 17  Temp: 98 F (36.7 C)  SpO2: 100%   Filed Weights    LABORATORY DATA:  I have reviewed the data as listed CMP Latest Ref Rng & Units 01/17/2020 12/21/2019 11/30/2019  Glucose 70 - 99 mg/dL 88 137(H) 108(H)  BUN 6 - 20 mg/dL _0 Creatinine 0.44 - 1.00 mg/dL 1.12(H) 1.06(H) 1.06(H)  Sodium 135 - 145 mmol/L 141 140 141  Potassium 3.5 - 5.1 mmol/L 3.3(L) 3.6 3.3(L)  Chloride 98 - 111 mmol/L 104 106 104  CO2  22 - 32 mmol/L _1 Calcium 8.9 - 10.3 mg/dL 8.2(L) 8.4(L) 8.6(L)  Total Protein 6.5 - 8.1 g/dL 7.5 7.2 7.5  Total Bilirubin 0.3 - 1.2 mg/dL 0.3 0.3 0.3  Alkaline Phos 38 - 126 U/L 56 64 60  AST 15 -  41 U/L 38 35 39  ALT 0 - 44 U/L 41 32 36    Lab Results  Component Value Date   WBC 7.8 01/17/2020   HGB 12.6 01/17/2020   HCT 39.2 01/17/2020   MCV 92.2 01/17/2020   PLT 311 01/17/2020   NEUTROABS 4.7 01/17/2020    ASSESSMENT & PLAN:  Acute superficial venous thrombosis of lower extremity, right 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  Seeing rheumatology at [facility].  Subcutaneous nodule MRI left arm: Infiltrating subcutaneous edema could be inflammatory or fat necrosis.  No specific lymph node seen. Sternal lesion: Cardiothoracic surgery was previously consulted.  They recommended an MRI of the chest.  I will obtain the MRI and will talk to them after the MRI result. Kidney lesion: Follows with urology.  She has an MRI of the kidney today.  She sees Dr. Lovena Neighbours tomorrow.  I discussed with her that at this time I do not have any further recommendations and we will await further pathology to determine if there is anything more to do. I will talk to her by MyChart video visit after the sternal MRI is done. I renewed her prescription for Percocets today.   No orders of the defined types were placed in this encounter.  The patient has a good understanding of the overall plan. she agrees with it. she will call with any problems that may develop before the next visit here.  Total time  spent: 30 mins including face to face time and time spent for planning, charting and coordination of care  Nicholas Lose, MD 02/01/2020  I, Cloyde Reams Dorshimer, am acting as scribe for Dr. Nicholas Lose.  I have reviewed the above documentation for accuracy and completeness, and I agree with the above.

## 2020-02-01 ENCOUNTER — Ambulatory Visit
Admission: RE | Admit: 2020-02-01 | Discharge: 2020-02-01 | Disposition: A | Discharge status: Home / Self Care | Payer: BC Managed Care – PPO | Source: Ambulatory Visit | Attending: Urology | Admitting: Urology

## 2020-02-01 ENCOUNTER — Inpatient Hospital Stay: Payer: BC Managed Care – PPO | Attending: Hematology and Oncology | Admitting: Hematology and Oncology

## 2020-02-01 ENCOUNTER — Inpatient Hospital Stay: Payer: BC Managed Care – PPO

## 2020-02-01 ENCOUNTER — Other Ambulatory Visit: Payer: Self-pay

## 2020-02-01 VITALS — BP 115/76 | HR 90 | Temp 98.0°F | Resp 17 | Ht 65.0 in

## 2020-02-01 DIAGNOSIS — Z8571 Personal history of Hodgkin lymphoma: Secondary | ICD-10-CM

## 2020-02-01 DIAGNOSIS — Z86718 Personal history of other venous thrombosis and embolism: Secondary | ICD-10-CM | POA: Diagnosis not present

## 2020-02-01 DIAGNOSIS — Z79899 Other long term (current) drug therapy: Secondary | ICD-10-CM | POA: Diagnosis not present

## 2020-02-01 DIAGNOSIS — Z9013 Acquired absence of bilateral breasts and nipples: Secondary | ICD-10-CM | POA: Insufficient documentation

## 2020-02-01 DIAGNOSIS — K219 Gastro-esophageal reflux disease without esophagitis: Secondary | ICD-10-CM | POA: Insufficient documentation

## 2020-02-01 DIAGNOSIS — I350 Nonrheumatic aortic (valve) stenosis: Secondary | ICD-10-CM | POA: Diagnosis not present

## 2020-02-01 DIAGNOSIS — D49512 Neoplasm of unspecified behavior of left kidney: Secondary | ICD-10-CM

## 2020-02-01 DIAGNOSIS — R05 Cough: Secondary | ICD-10-CM | POA: Insufficient documentation

## 2020-02-01 DIAGNOSIS — I890 Lymphedema, not elsewhere classified: Secondary | ICD-10-CM | POA: Insufficient documentation

## 2020-02-01 DIAGNOSIS — R079 Chest pain, unspecified: Secondary | ICD-10-CM | POA: Diagnosis not present

## 2020-02-01 DIAGNOSIS — I50810 Right heart failure, unspecified: Secondary | ICD-10-CM | POA: Diagnosis not present

## 2020-02-01 DIAGNOSIS — I82811 Embolism and thrombosis of superficial veins of right lower extremities: Secondary | ICD-10-CM

## 2020-02-01 DIAGNOSIS — J9611 Chronic respiratory failure with hypoxia: Secondary | ICD-10-CM | POA: Diagnosis not present

## 2020-02-01 DIAGNOSIS — I471 Supraventricular tachycardia: Secondary | ICD-10-CM | POA: Diagnosis not present

## 2020-02-01 DIAGNOSIS — Z8589 Personal history of malignant neoplasm of other organs and systems: Secondary | ICD-10-CM | POA: Insufficient documentation

## 2020-02-01 DIAGNOSIS — R635 Abnormal weight gain: Secondary | ICD-10-CM | POA: Diagnosis not present

## 2020-02-01 DIAGNOSIS — I110 Hypertensive heart disease with heart failure: Secondary | ICD-10-CM | POA: Insufficient documentation

## 2020-02-01 DIAGNOSIS — K760 Fatty (change of) liver, not elsewhere classified: Secondary | ICD-10-CM | POA: Diagnosis not present

## 2020-02-01 DIAGNOSIS — R51 Headache with orthostatic component, not elsewhere classified: Secondary | ICD-10-CM | POA: Insufficient documentation

## 2020-02-01 DIAGNOSIS — R5383 Other fatigue: Secondary | ICD-10-CM | POA: Diagnosis not present

## 2020-02-01 DIAGNOSIS — Z8541 Personal history of malignant neoplasm of cervix uteri: Secondary | ICD-10-CM

## 2020-02-01 DIAGNOSIS — R630 Anorexia: Secondary | ICD-10-CM | POA: Diagnosis not present

## 2020-02-01 DIAGNOSIS — E860 Dehydration: Secondary | ICD-10-CM | POA: Diagnosis not present

## 2020-02-01 DIAGNOSIS — S2221XS Fracture of manubrium, sequela: Secondary | ICD-10-CM

## 2020-02-01 DIAGNOSIS — Z85850 Personal history of malignant neoplasm of thyroid: Secondary | ICD-10-CM | POA: Insufficient documentation

## 2020-02-01 DIAGNOSIS — R229 Localized swelling, mass and lump, unspecified: Secondary | ICD-10-CM

## 2020-02-01 DIAGNOSIS — Z923 Personal history of irradiation: Secondary | ICD-10-CM | POA: Insufficient documentation

## 2020-02-01 DIAGNOSIS — N289 Disorder of kidney and ureter, unspecified: Secondary | ICD-10-CM | POA: Insufficient documentation

## 2020-02-01 DIAGNOSIS — N281 Cyst of kidney, acquired: Secondary | ICD-10-CM | POA: Diagnosis not present

## 2020-02-01 DIAGNOSIS — Z86000 Personal history of in-situ neoplasm of breast: Secondary | ICD-10-CM | POA: Insufficient documentation

## 2020-02-01 DIAGNOSIS — R2232 Localized swelling, mass and lump, left upper limb: Secondary | ICD-10-CM | POA: Insufficient documentation

## 2020-02-01 DIAGNOSIS — J029 Acute pharyngitis, unspecified: Secondary | ICD-10-CM | POA: Diagnosis not present

## 2020-02-01 DIAGNOSIS — I272 Pulmonary hypertension, unspecified: Secondary | ICD-10-CM | POA: Insufficient documentation

## 2020-02-01 DIAGNOSIS — Z452 Encounter for adjustment and management of vascular access device: Secondary | ICD-10-CM | POA: Diagnosis not present

## 2020-02-01 DIAGNOSIS — C73 Malignant neoplasm of thyroid gland: Secondary | ICD-10-CM | POA: Diagnosis not present

## 2020-02-01 DIAGNOSIS — Z7982 Long term (current) use of aspirin: Secondary | ICD-10-CM | POA: Insufficient documentation

## 2020-02-01 MED ORDER — GADOBENATE DIMEGLUMINE 529 MG/ML IV SOLN
15.0000 mL | Freq: Once | INTRAVENOUS | Status: AC | PRN
  Administered 2020-02-01: 15 mL via INTRAVENOUS

## 2020-02-01 MED ORDER — OXYCODONE-ACETAMINOPHEN 5-325 MG PO TABS: 1.0000 | ORAL_TABLET | Freq: Three times a day (TID) | ORAL | 0 refills | Status: DC | PRN

## 2020-02-01 MED ORDER — SODIUM CHLORIDE FLUSH 0.9 % IV SOLN: INTRAVENOUS | 1 refills | Status: DC

## 2020-02-01 MED ORDER — HEPARIN SOD (PORK) LOCK FLUSH 1 UNIT/ML IV SOLN: INTRAVENOUS | 1 refills | Status: DC

## 2020-02-01 NOTE — Assessment & Plan Note (Signed)
MRI left arm: Infiltrating subcutaneous edema could be inflammatory or fat necrosis.  No specific lymph node seen. Sternal lesion: Cardiothoracic surgery Kidney lesion: Follows with urology.  I discussed with her that at this time I do not have any further recommendations and we will await further pathology to determine if there is anything more to do.

## 2020-02-01 NOTE — Assessment & Plan Note (Signed)
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  Seeing rheumatology at [facility].

## 2020-02-08 ENCOUNTER — Encounter: Payer: Self-pay | Admitting: Hematology and Oncology

## 2020-02-09 ENCOUNTER — Telehealth: Payer: Self-pay | Admitting: Emergency Medicine

## 2020-02-09 ENCOUNTER — Other Ambulatory Visit: Payer: BC Managed Care – PPO

## 2020-02-09 ENCOUNTER — Telehealth: Payer: Self-pay | Admitting: Medical

## 2020-02-09 ENCOUNTER — Other Ambulatory Visit: Payer: Self-pay | Admitting: *Deleted

## 2020-02-09 ENCOUNTER — Ambulatory Visit: Payer: BC Managed Care – PPO

## 2020-02-09 ENCOUNTER — Encounter: Payer: BC Managed Care – PPO | Admitting: Medical

## 2020-02-09 DIAGNOSIS — Z8571 Personal history of Hodgkin lymphoma: Secondary | ICD-10-CM

## 2020-02-09 NOTE — Progress Notes (Deleted)
rescheduled

## 2020-02-09 NOTE — Telephone Encounter (Signed)
Scheduled per 5/11 sch message - unable to reach pt . Left message with appt date and time

## 2020-02-09 NOTE — Progress Notes (Signed)
[facility] Progress Note   [name] [id number] Aug 02, 1971 49 y.o.  [name] is managed by Dr. Nicholas Lose  Actively treated with chemotherapy/immunotherapy/hormonal therapy: no  Next scheduled appointment with provider: 02/15/2020  Assessment: Plan:    Generalized headaches - Plan: HYDROmorphone (DILAUDID) injection 1 mg, diphenhydrAMINE (BENADRYL) injection 25 mg, HYDROmorphone (DILAUDID) injection 1 mg  Dehydration - Plan: 0.9 %  sodium chloride infusion, diphenhydrAMINE (BENADRYL) injection 25 mg  Lymphedema  Cough  Weight gain  Fatigue, unspecified type  Anorexia  Sore throat  Acute superficial venous thrombosis of lower extremity, right  Thyroid cancer ([place])  History of cervical cancer  History of ductal carcinoma in situ (DCIS) of breast  Hx of Hodgkins lymphoma   Headache: Patient was given Dilaudid 1 mg IV and was given Benadryl 25 mg IV x1 given her history of possible reactions to narcotics.  Lymphedema, cough, unexplained weight gain, fatigue, anorexia, sore throat, and intractable headache: The patient was taken to the emergency room for evaluation and management.  History of thyroid cancer, history of cervical cancer, history of ductal carcinoma in situ of the breast, history of Hodgkin's lymphoma, and history of a thrombus of the right lower extremity and left upper extremity and a history of a manubriosternal lesion and left arm nodule of unknown etiology: The patient continues to be followed by Dr. Nicholas Lose and is scheduled to be seen next on 02/15/2020.  Please see After Visit Summary for patient specific instructions.  Future Appointments  Date Time Provider [place]  02/12/2020 11:30 AM [facility] [place] None  02/12/2020  4:00 PM [facility] 1 [facility] [place]  02/15/2020 10:30 AM Nicholas Lose, MD [facility] None    No orders of the defined types were placed in this encounter.       Subjective:   Patient ID:  [name] is a 49 y.o. (DOB 07/17/1971) female.  Chief Complaint:  Chief Complaint  Patient presents with  . Fatigue    HPI [name]  is a 49 y.o. female with a complex oncologic and hematologic history that includes a history of ductal carcinoma in situ, Hodgkin's lymphoma, thyroid cancer, cervical cancer, and an acute superficial venous thrombus of the right lower extremity.  She is managed by Dr. Nicholas Lose and is being followed with conservative surveillance. She was last seen on 02/01/2020. She contacted our office on Monday reporting, "I am so weak I can barely get out of bed, I hurt all over and my Lymphedema is so bad right now my fingers are purple and unusable. I feel miserable."  The patient presents to clinic today stating that she has been having a gurgling sound with deep breathing, a cough with brown to bloody sputum, mouth dryness, sore throat, anorexia, nausea, fatigue, increased somnolence, a left sided headache for 5 days despite using her migraine medications, abdominal tenderness, a 6 pound weight gain over the past 2 to 3 days, lymphedema of the hands and legs, and a generalized feeling of ill health.  She continues to use supplemental oxygen constantly.  Despite this she reports that her oxygen level dropped to the low 80s last evening. She has a manubriosternal lesion and left arm nodule which Dr. Lindi Adie has been following.  Medications: I have reviewed the patient's current medications.  Allergies:  Allergies  Allergen Reactions  . Mushroom Extract Complex Anaphylaxis  . Na Ferric Gluc Cplx In Sucrose Anaphylaxis  . Cymbalta [Duloxetine Hcl] Swelling  and Anxiety  . Succinylcholine Other (See Comments)    Lock Jaw  . Buprenorphine Hcl Hives  . Compazine Other (See Comments)    Altered mental status Aggression  . Metoclopramide Other (See Comments)    Dystonia  . Morphine And Related Hives  . Ondansetron Hives and Rash   . Promethazine Hcl Hives  . Tegaderm Ag Mesh [Silver] Rash    Old formulation only, is able to tolerate new formulation    Past Medical History:  Diagnosis Date  . Addison's disease ([place])   . Adrenal insufficiency ([place])   . Anemia   . Anxiety   . Aortic stenosis   . Aortic stenosis   . Appendicitis 12/19/09  . Appendicitis   . Breast cancer ([place])    STATUS POST BILATERAL MASTECTOMY. STATUS POST RECONSTRUCTION. SHE HAD SILICONE BREAST IMPLANTS AND THE LEFT IMPLANT IS LEAKING SLIGHTLY  . Cellulitis of right middle finger 11/07/2018  . Cervical cancer ([place]) 12/23/2018  . Chest pain   . CHF with right heart failure ([place]) 04/17/2017  . Chronic respiratory failure with hypoxia ([place]) 12/23/2018  . Cough variant asthma 04/13/2019  . Depression   . GERD (gastroesophageal reflux disease)   . Headache    migraines  . Heart murmur   . History of kidney stones   . Hodgkin lymphoma ([facility])    STATUS POST MANTLE RADIATION  . Hodgkin's lymphoma ([place])    1987  . Hypertension   . Hypoxia   . Necrotizing fasciitis ([place]) 12/23/2018  . Non-ischemic cardiomyopathy ([place])   . Osteoporosis   . Palpitations   . Pituitary adenoma ([place]) 12/23/2018  . Pneumonia   . PONV (postoperative nausea and vomiting)   . Pre-diabetes    per pt; no meds  . Pulmonary hypertension ([place]) 12/23/2018  . Raynaud phenomenon   . Right heart failure ([place]) 04/17/2017  . Supplemental oxygen dependent    3 liters  . SVT (supraventricular tachycardia) ([place])   . Tachycardia   . Thyroid cancer ([place])    STATUS POST SURGICAL REMOVAL-CURRENT ON THYROID REPLACEMENT    Past Surgical History:  Procedure Laterality Date  . ABDOMINAL HYSTERECTOMY    . AMPUTATION Left 01/30/2019   Procedure: Left Index finger amputation with flap reconstruction and repair reconstruction;  Surgeon: Roseanne Kaufman, MD;  Location: [facility];  Service: Orthopedics;  Laterality: Left;  . APPENDECTOMY    . CARDIAC CATHETERIZATION  05/18/09   NORMAL CATH  . I  & D EXTREMITY Left 12/23/2018   Procedure: IRRIGATION AND DEBRIDEMENT HAND / INDEX FINGER;  Surgeon: Roseanne Kaufman, MD;  Location: [facility];  Service: Orthopedics;  Laterality: Left;  Marland Kitchen MASTECTOMY    . PITUITARY SURGERY    . RIGHT/LEFT HEART CATH AND CORONARY ANGIOGRAPHY N/A 04/02/2018   Procedure: RIGHT/LEFT HEART CATH AND CORONARY ANGIOGRAPHY;  Surgeon: Burnell Blanks, MD;  Location: [facility];  Service: Cardiovascular;  Laterality: N/A;  . TOTAL THYROIDECTOMY    . VIDEO BRONCHOSCOPY Bilateral 11/14/2018   Procedure: VIDEO BRONCHOSCOPY WITHOUT FLUORO;  Surgeon: Margaretha Seeds, MD;  Location: [facility];  Service: Cardiopulmonary;  Laterality: Bilateral;  . VIDEO BRONCHOSCOPY WITH ENDOBRONCHIAL ULTRASOUND N/A 11/19/2018   Procedure: VIDEO BRONCHOSCOPY WITH ENDOBRONCHIAL ULTRASOUND;  Surgeon: Margaretha Seeds, MD;  Location: [facility];  Service: Thoracic;  Laterality: N/A;    Family History  Family history unknown: Yes    Social History   Socioeconomic History  . Marital status: Married    Spouse name: Not  on file  . Number of children: Not on file  . Years of education: Not on file  . Highest education level: Not on file  Occupational History  . Not on file  Tobacco Use  . Smoking status: Never Smoker  . Smokeless tobacco: Never Used  Substance and Sexual Activity  . Alcohol use: Yes    Comment: social   . Drug use: No  . Sexual activity: Yes    Birth control/protection: Surgical  Other Topics Concern  . Not on file  Social History Narrative  . Not on file   Social Determinants of Health   Financial Resource Strain:   . Difficulty of Paying Living Expenses:   Food Insecurity:   . Worried About Charity fundraiser in the Last Year:   . Arboriculturist in the Last Year:   Transportation Needs:   . Film/video editor (Medical):   Marland Kitchen Lack of Transportation (Non-Medical):   Physical Activity:   . Days of Exercise per Week:   . Minutes of Exercise per  Session:   Stress:   . Feeling of Stress :   Social Connections:   . Frequency of Communication with Friends and Family:   . Frequency of Social Gatherings with Friends and Family:   . Attends Religious Services:   . Active Member of Clubs or Organizations:   . Attends Archivist Meetings:   Marland Kitchen Marital Status:   Intimate Partner Violence:   . Fear of Current or Ex-Partner:   . Emotionally Abused:   Marland Kitchen Physically Abused:   . Sexually Abused:     Past Medical History, Surgical history, Social history, and Family history were reviewed and updated as appropriate.   Please see review of systems for further details on the patient's review from today.   Review of Systems:  Review of Systems  Constitutional: Positive for appetite change and fatigue. Negative for chills, diaphoresis and fever.  HENT: Positive for sore throat. Negative for trouble swallowing and voice change.   Respiratory: Positive for cough and shortness of breath. Negative for chest tightness and wheezing.   Cardiovascular: Positive for leg swelling. Negative for chest pain and palpitations.  Gastrointestinal: Positive for nausea. Negative for abdominal pain, constipation, diarrhea and vomiting.  Musculoskeletal: Negative for back pain and myalgias.  Neurological: Positive for headaches. Negative for dizziness and light-headedness.  Psychiatric/Behavioral: Positive for sleep disturbance.    Objective:   Physical Exam:  BP 102/80 (BP Location: Left Leg, Patient Position: Sitting)   Pulse 88   Temp 98.5 F (36.9 C) (Oral)   Resp (!) 26   Wt 192 lb 4.8 oz (87.2 kg)   LMP  (LMP Unknown)   SpO2 98% Comment: 3L [place] at baseline  BMI 32.00 kg/m  ECOG: 2  Physical Exam Constitutional:      General: She is not in acute distress.    Appearance: She is ill-appearing. She is not diaphoretic.     Comments: The patient is an adult female who appears to be chronically ill.  She is receiving O2 via nasal cannula.    HENT:     Head: Normocephalic and atraumatic.     Mouth/Throat:     Mouth: Mucous membranes are dry.     Pharynx: Oropharynx is clear.  Eyes:     General: No scleral icterus.       Right eye: No discharge.        Left eye: No discharge.  Conjunctiva/sclera: Conjunctivae normal.  Cardiovascular:     Rate and Rhythm: Normal rate and regular rhythm.     Heart sounds: Normal heart sounds. No murmur. No friction rub. No gallop.   Pulmonary:     Effort: Pulmonary effort is normal. No respiratory distress.     Breath sounds: Normal breath sounds. No wheezing or rales.  Abdominal:     General: There is distension.  Musculoskeletal:        General: Swelling present.     Right lower leg: Edema present.     Left lower leg: Edema present.     Comments: Upper and lower extremities show trace to 1+ pitting edema bilaterally.  Skin:    General: Skin is warm and dry.     Findings: No erythema or rash.  Neurological:     Mental Status: She is alert.     Gait: Gait abnormal (The patient is ambulating with the use of a wheelchair today.).  Psychiatric:     Comments: The patient is intermittently tearful.     Lab Review:     Component Value Date/Time   NA 141 02/10/2020 1020   NA 141 04/01/2018 1617   K 3.4 (L) 02/10/2020 1020   CL 106 02/10/2020 1020   CO2 24 02/10/2020 1020   GLUCOSE 98 02/10/2020 1020   BUN 15 02/10/2020 1020   BUN 11 04/01/2018 1617   CREATININE 0.99 02/10/2020 1020   CALCIUM 8.4 (L) 02/10/2020 1020   PROT 7.3 02/10/2020 1020   PROT 7.1 01/23/2018 1225   ALBUMIN 3.7 02/10/2020 1020   ALBUMIN 4.4 01/23/2018 1225   AST 36 02/10/2020 1020   ALT 43 02/10/2020 1020   ALKPHOS 59 02/10/2020 1020   BILITOT 0.4 02/10/2020 1020   GFRNONAA >60 02/10/2020 1020   GFRAA >60 02/10/2020 1020       Component Value Date/Time   WBC 7.1 02/10/2020 1020   WBC 7.8 01/17/2020 1440   RBC 4.30 02/10/2020 1020   HGB 12.7 02/10/2020 1020   HGB 12.0 12/21/2019 1418   HCT  39.3 02/10/2020 1020   HCT 39.9 04/01/2018 1617   PLT 297 02/10/2020 1020   PLT 327 04/01/2018 1617   MCV 91.4 02/10/2020 1020   MCV 89 04/01/2018 1617   MCH 29.5 02/10/2020 1020   MCHC 32.3 02/10/2020 1020   RDW 14.2 02/10/2020 1020   RDW 13.6 04/01/2018 1617   LYMPHSABS 2.4 02/10/2020 1020   MONOABS 0.7 02/10/2020 1020   EOSABS 0.2 02/10/2020 1020   BASOSABS 0.1 02/10/2020 1020   -------------------------------  Imaging from last 24 hours (if applicable):  Radiology interpretation: CT Head Wo Contrast  Result Date: 02/10/2020 CLINICAL DATA:  Headache, history of multiple cancers EXAM: CT HEAD WITHOUT CONTRAST TECHNIQUE: Contiguous axial images were obtained from the base of the skull through the vertex without intravenous contrast. COMPARISON:  2013 FINDINGS: Brain: There is no acute intracranial hemorrhage, mass effect, or edema. Gray-white differentiation is preserved. There is no extra-axial fluid collection. Ventricles and sulci are within normal limits in size and configuration. Vascular: No hyperdense vessel or unexpected calcification. Skull: Calvarium is unremarkable. Sinuses/Orbits: No acute finding. Other: None. IMPRESSION: No acute intracranial abnormality. Electronically Signed   By: Macy Mis M.D.   On: 02/10/2020 13:49   CT Angio Chest PE W and/or Wo Contrast  Result Date: 02/10/2020 CLINICAL DATA:  Shortness of breath, hemoptysis, cough. History of multiple cancers EXAM: CT ANGIOGRAPHY CHEST WITH CONTRAST TECHNIQUE: Multidetector CT imaging of the  chest was performed using the standard protocol during bolus administration of intravenous contrast. Multiplanar CT image reconstructions and MIPs were obtained to evaluate the vascular anatomy. CONTRAST:  133m OMNIPAQUE IOHEXOL 350 MG/ML SOLN COMPARISON:  12/29/2019 FINDINGS: Cardiovascular: Satisfactory opacification of the pulmonary arteries to the segmental level. No evidence of pulmonary embolism. Normal heart size. No  pericardial effusion. Thoracic aorta is nonaneurysmal. Minimal atherosclerotic calcification of the aortic arch. Right-sided chest port is seen with distal tip terminating at the superior cavoatrial junction. Mediastinum/Nodes: Prior right axillary lymph node dissection. No axillary lymphadenopathy. No enlarged mediastinal or hilar lymph nodes. Status post thyroidectomy. Trachea and esophagus are within normal limits. Lungs/Pleura: Stable subpleural fat containing nodule along the posterior right hemidiaphragm (series 6, image 97) is unchanged from 2016, benign. No new pulmonary nodule. There is mosaic attenuation of the bilateral lung fields. No focal lobar consolidation. No pleural effusion or pneumothorax. Upper Abdomen: No acute abnormality. Musculoskeletal: Bilateral breast prostheses. No acute osseous abnormality. No suspicious lytic or sclerotic bone lesion. Review of the MIP images confirms the above findings. IMPRESSION: 1. No evidence of pulmonary embolism. 2. Mosaic attenuation of the bilateral lung fields, which can be seen with small airways disease/bronchiolitis. A Electronically Signed   By: NDavina PokeD.O.   On: 02/10/2020 14:59   MR HUMERUS LEFT W WO CONTRAST  Result Date: 01/31/2020 CLINICAL DATA:  Breast cancer and history of Hodgkin's lymphoma along with renal insufficiency. Palpable lump along the skin. EXAM: MRI OF THE LEFT HUMERUS WITHOUT AND WITH CONTRAST TECHNIQUE: Multiplanar, multisequence MR imaging of the left upper arm was performed before and after the administration of intravenous contrast. CONTRAST:  157mGADAVIST GADOBUTROL 1 MMOL/ML IV SOLN COMPARISON:  Ultrasound 01/13/2020 and bone scan of 12/29/2019 FINDINGS: Bones/Joint/Cartilage The humeral shaft in the region of concern appears normal. Ligaments N/A Muscles and Tendons Unremarkable Soft tissues The marked region of concern along the left posterolateral upper arm, there is infiltrative subcutaneous edema. If there is  any enhancement at all it is minimal. I do not see a worrisome masslike appearance or a drainable collection. Some of this edema signal extends adjacent to the superficial fascia margin of the triceps but does not involve the triceps muscle. There is also some mild overlying cutaneous thickening, for example on image 6/16. IMPRESSION: 1. In the region of concern there is mild infiltrative subcutaneous edema which could be inflammatory or conceivably from subcutaneous fat necrosis. I do not see a specific lymph node or a solid lesion. Electronically Signed   By: WaVan Clines.D.   On: 01/31/2020 18:13   MR ABDOMEN WWO CONTRAST  Result Date: 02/02/2020 CLINICAL DATA:  Focality history including lymphoma, breast cancers, cervical cancer thyroid cancer and melanoma. Indeterminate lesion in the LEFT kidney on CT. EXAM: MRI ABDOMEN WITHOUT AND WITH CONTRAST TECHNIQUE: Multiplanar multisequence MR imaging of the abdomen was performed both before and after the administration of intravenous contrast. CONTRAST:  1557mULTIHANCE GADOBENATE DIMEGLUMINE 529 MG/ML IV SOLN COMPARISON:  CT 01/17/2020 FINDINGS: Lower chest:  Lung bases are clear. Hepatobiliary: Multiple small benign cysts within liver parenchyma. Gallbladder is normal. Common bile duct normal caliber. Mild hepatic steatosis demonstrated on opposed phase imaging (series 8). Pancreas: Normal pancreatic parenchymal intensity. No ductal dilatation or inflammation. Spleen: Normal spleen. Adrenals/urinary tract: Adrenal glands normal. The small exophytic lesion along the deep margin of the LEFT kidney measures 6 mm (image 26/3) and is hyperintense on T2 weighted imaging. This lesion is hypointense on precontrast T1  weighted imaging and demonstrates no post-contrast enhancement (image 72/series 11). Findings are consistent with a benign renal cyst. Additional cysts within the LEFT and RIGHT renal cortex without enhancement. Stomach/Bowel: Stomach and limited of  the small bowel is unremarkable Vascular/Lymphatic: Abdominal aortic normal caliber. No retroperitoneal periportal lymphadenopathy. Musculoskeletal: No aggressive osseous lesion IMPRESSION: 1. LEFT renal lesion of concern on comparison CT corresponds to a benign Bosniak 1 renal cyst. 2. Additional bilateral Bosniak 1 renal cortical cysts. 3. Mild hepatic steatosis. Electronically Signed   By: Suzy Bouchard M.D.   On: 02/02/2020 08:49   DG Chest Portable 1 View  Result Date: 02/10/2020 CLINICAL DATA:  Shortness of breath. EXAM: PORTABLE CHEST 1 VIEW COMPARISON:  July 09, 2019. FINDINGS: The heart size and mediastinal contours are within normal limits. Stable right internal jugular Port-A-Cath is noted. Minimal bibasilar subsegmental atelectasis is noted. No pneumothorax or pleural effusion is noted. The visualized skeletal structures are unremarkable. IMPRESSION: Minimal bibasilar subsegmental atelectasis. Electronically Signed   By: Marijo Conception M.D.   On: 02/10/2020 13:08   MR MRV HEAD W WO CONTRAST  Result Date: 02/10/2020 CLINICAL DATA:  History of breast cancer and lymphoma. History of thyroid cancer and cervical cancer. Headache. Assess for intracranial venous thrombosis. EXAM: MR VENOGRAM HEAD WITHOUT AND WITH CONTRAST TECHNIQUE: Angiographic images of the intracranial venous structures were obtained using MRV technique without and with intravenous contrast. CONTRAST:  34m GADAVIST GADOBUTROL 1 MMOL/ML IV SOLN COMPARISON:  Head CT same day FINDINGS: The study is normal. Superior sagittal sinus is patent and normal. Transverse sinuses are normal. Sigmoid sinuses are normal. Jugular veins both show flow. Deep veins are patent. No evidence of superficial venous thrombosis. No abnormal brain enhancement seen on limited parenchymal imaging. IMPRESSION: Normal study. No cause of headache identified. No evidence of intracranial venous thrombosis. Electronically Signed   By: MNelson ChimesM.D.   On:  02/10/2020 16:16   [place] UPPER EXTREM LTD SOFT TISSUE NON VASCULAR  Result Date: 01/13/2020 CLINICAL DATA:  History of breast carcinoma, Hodgkin's lymphoma, and renal insufficiency. Focus of activity in the posterior left upper arm on bone scintigraphy. EXAM: ULTRASOUND LEFT UPPER EXTREMITY LIMITED TECHNIQUE: Ultrasound examination of the upper extremity soft tissues was performed in the area of clinical concern. Scanning was performed by the technologist and myself, with direct comparison to imaging from scintigraphy. COMPARISON:  Scintigraphy 12/29/2019 FINDINGS: No focal lesion is identified in the posterior or medial left upper arm to correlate with the focus of activity on bone scintigraphy. IMPRESSION: 1. No focal lesion in the left upper arm to correlate with bone scan findings. Biopsy deferred. If further evaluation is required, consider MR left upper arm with contrast. Electronically Signed   By: DLucrezia EuropeM.D.   On: 01/13/2020 10:21   CT Renal Stone Study  Result Date: 01/17/2020 CLINICAL DATA:  Left frank pain. History of nephrolithiasis. History of lymphoma, breast cancer, cervical cancer, thyroid cancer and melanoma. EXAM: CT ABDOMEN AND PELVIS WITHOUT CONTRAST TECHNIQUE: Multidetector CT imaging of the abdomen and pelvis was performed following the standard protocol without IV contrast. COMPARISON:  12/29/2019 FINDINGS: Lower chest: Stable bilateral breast implants. Normal sized heart. The previously demonstrated fat containing nodule along the right hemidiaphragm at the right lung base has a more medium density appearance today. This measures 11 x 8 mm on image number 22 series 4, unchanged. No parenchymal lung nodules are seen. No pleural fluid. Hepatobiliary: No significant change in small liver cysts. Normal  appearing gallbladder. Pancreas: Unremarkable. No pancreatic ductal dilatation or surrounding inflammatory changes. Spleen: Normal in size without focal abnormality. Adrenals/Urinary  Tract: Normal appearing adrenal glands. 4 mm upper pole left renal calculus. Additional tiny lower pole left renal calculus. 5 mm exophytic mass arising from the posterior aspect of the mid to lower left kidney. Unremarkable right kidney. Interval 5 mm calculus at the left ureterovesical junction without dilatation of the left renal collecting system or ureter. Stable multiple bilateral pelvic phleboliths. Stomach/Bowel: Unremarkable stomach, small bowel and colon. Surgically absent appendix. Vascular/Lymphatic: Minimal atheromatous arterial calcifications. No enlarged lymph nodes. Reproductive: Status post hysterectomy. No adnexal masses. Other: No abdominal wall hernia or abnormality. No abdominopelvic ascites. Musculoskeletal: Minimal lumbar and lower thoracic spine degenerative changes. No evidence of bony metastatic disease. IMPRESSION: 1. Interval 5 mm nonobstructing calculus at the left ureterovesical junction. 2. Two small nonobstructing left renal calculi. 3. 5 mm exophytic mass arising from the posterior aspect of the mid to lower left kidney. This could represent a small solid mass or proteinaceous cyst. 4. Stable 11 x 8 mm nodule along the right hemidiaphragm except no associated fat density currently demonstrated. Electronically Signed   By: Claudie Revering M.D.   On: 01/17/2020 15:58        This case was discussed with Dr. Lindi Adie. He expresses agreement with my management of this patient.

## 2020-02-09 NOTE — Telephone Encounter (Signed)
Called patient regarding missed [place] appt today.  Pt states she has been sleeping and did not hear a call from the [facility].  Attempted to speak further with patient but line appears to drop/service ends.  Attempted to contact patient through three different phone lines, able to reach patient but unable to hear more than a few words before losing connection.  Spoke with desk RN Merleen Nicely who states she will call patient or message her on MyChart regarding appts for [facility] today to see if she wants to come in today or be seen tomorrow.  Merleen Nicely was able to reach pt who requested to be seen tomorrow morning (02/10/20) for labs/flush/[facility] at [id number].

## 2020-02-10 ENCOUNTER — Emergency Department (HOSPITAL_COMMUNITY): Payer: BC Managed Care – PPO

## 2020-02-10 ENCOUNTER — Encounter (HOSPITAL_COMMUNITY): Payer: Self-pay | Admitting: Radiology

## 2020-02-10 ENCOUNTER — Other Ambulatory Visit: Payer: Self-pay

## 2020-02-10 ENCOUNTER — Inpatient Hospital Stay: Payer: BC Managed Care – PPO

## 2020-02-10 ENCOUNTER — Inpatient Hospital Stay (HOSPITAL_BASED_OUTPATIENT_CLINIC_OR_DEPARTMENT_OTHER): Payer: BC Managed Care – PPO | Admitting: Medical

## 2020-02-10 ENCOUNTER — Inpatient Hospital Stay (HOSPITAL_COMMUNITY)
Admission: EM | Admit: 2020-02-10 | Discharge: 2020-02-14 | DRG: 103 | Disposition: A | Discharge status: Home / Self Care | Payer: BC Managed Care – PPO | Attending: Internal Medicine | Admitting: Internal Medicine

## 2020-02-10 VITALS — BP 102/80 | HR 88 | Temp 98.5°F | Resp 26 | Wt 192.3 lb

## 2020-02-10 DIAGNOSIS — Z86000 Personal history of in-situ neoplasm of breast: Secondary | ICD-10-CM

## 2020-02-10 DIAGNOSIS — G43509 Persistent migraine aura without cerebral infarction, not intractable, without status migrainosus: Secondary | ICD-10-CM | POA: Diagnosis not present

## 2020-02-10 DIAGNOSIS — I7300 Raynaud's syndrome without gangrene: Secondary | ICD-10-CM | POA: Diagnosis not present

## 2020-02-10 DIAGNOSIS — I110 Hypertensive heart disease with heart failure: Secondary | ICD-10-CM | POA: Diagnosis present

## 2020-02-10 DIAGNOSIS — I50810 Right heart failure, unspecified: Secondary | ICD-10-CM | POA: Diagnosis present

## 2020-02-10 DIAGNOSIS — T8549XA Other mechanical complication of breast prosthesis and implant, initial encounter: Secondary | ICD-10-CM | POA: Diagnosis not present

## 2020-02-10 DIAGNOSIS — Z91018 Allergy to other foods: Secondary | ICD-10-CM

## 2020-02-10 DIAGNOSIS — C8590 Non-Hodgkin lymphoma, unspecified, unspecified site: Secondary | ICD-10-CM | POA: Diagnosis not present

## 2020-02-10 DIAGNOSIS — J9611 Chronic respiratory failure with hypoxia: Secondary | ICD-10-CM | POA: Diagnosis present

## 2020-02-10 DIAGNOSIS — E860 Dehydration: Secondary | ICD-10-CM | POA: Diagnosis not present

## 2020-02-10 DIAGNOSIS — R05 Cough: Secondary | ICD-10-CM | POA: Diagnosis not present

## 2020-02-10 DIAGNOSIS — R7303 Prediabetes: Secondary | ICD-10-CM | POA: Diagnosis present

## 2020-02-10 DIAGNOSIS — I350 Nonrheumatic aortic (valve) stenosis: Secondary | ICD-10-CM | POA: Diagnosis present

## 2020-02-10 DIAGNOSIS — Z20822 Contact with and (suspected) exposure to covid-19: Secondary | ICD-10-CM | POA: Diagnosis not present

## 2020-02-10 DIAGNOSIS — F329 Major depressive disorder, single episode, unspecified: Secondary | ICD-10-CM | POA: Diagnosis present

## 2020-02-10 DIAGNOSIS — Z885 Allergy status to narcotic agent status: Secondary | ICD-10-CM

## 2020-02-10 DIAGNOSIS — R5383 Other fatigue: Secondary | ICD-10-CM

## 2020-02-10 DIAGNOSIS — Z85850 Personal history of malignant neoplasm of thyroid: Secondary | ICD-10-CM

## 2020-02-10 DIAGNOSIS — I428 Other cardiomyopathies: Secondary | ICD-10-CM | POA: Diagnosis not present

## 2020-02-10 DIAGNOSIS — R338 Other retention of urine: Secondary | ICD-10-CM | POA: Diagnosis not present

## 2020-02-10 DIAGNOSIS — Z9221 Personal history of antineoplastic chemotherapy: Secondary | ICD-10-CM

## 2020-02-10 DIAGNOSIS — I776 Arteritis, unspecified: Secondary | ICD-10-CM | POA: Diagnosis not present

## 2020-02-10 DIAGNOSIS — F419 Anxiety disorder, unspecified: Secondary | ICD-10-CM | POA: Diagnosis present

## 2020-02-10 DIAGNOSIS — Z79890 Hormone replacement therapy (postmenopausal): Secondary | ICD-10-CM

## 2020-02-10 DIAGNOSIS — J219 Acute bronchiolitis, unspecified: Secondary | ICD-10-CM | POA: Diagnosis not present

## 2020-02-10 DIAGNOSIS — R519 Headache, unspecified: Secondary | ICD-10-CM | POA: Diagnosis not present

## 2020-02-10 DIAGNOSIS — E271 Primary adrenocortical insufficiency: Secondary | ICD-10-CM | POA: Diagnosis not present

## 2020-02-10 DIAGNOSIS — R531 Weakness: Secondary | ICD-10-CM

## 2020-02-10 DIAGNOSIS — J029 Acute pharyngitis, unspecified: Secondary | ICD-10-CM

## 2020-02-10 DIAGNOSIS — R635 Abnormal weight gain: Secondary | ICD-10-CM

## 2020-02-10 DIAGNOSIS — Z888 Allergy status to other drugs, medicaments and biological substances status: Secondary | ICD-10-CM

## 2020-02-10 DIAGNOSIS — M7989 Other specified soft tissue disorders: Secondary | ICD-10-CM

## 2020-02-10 DIAGNOSIS — J9621 Acute and chronic respiratory failure with hypoxia: Secondary | ICD-10-CM | POA: Diagnosis not present

## 2020-02-10 DIAGNOSIS — I890 Lymphedema, not elsewhere classified: Secondary | ICD-10-CM

## 2020-02-10 DIAGNOSIS — E890 Postprocedural hypothyroidism: Secondary | ICD-10-CM | POA: Diagnosis not present

## 2020-02-10 DIAGNOSIS — Z8571 Personal history of Hodgkin lymphoma: Secondary | ICD-10-CM

## 2020-02-10 DIAGNOSIS — K219 Gastro-esophageal reflux disease without esophagitis: Secondary | ICD-10-CM | POA: Diagnosis present

## 2020-02-10 DIAGNOSIS — R609 Edema, unspecified: Secondary | ICD-10-CM | POA: Diagnosis not present

## 2020-02-10 DIAGNOSIS — I340 Nonrheumatic mitral (valve) insufficiency: Secondary | ICD-10-CM | POA: Diagnosis not present

## 2020-02-10 DIAGNOSIS — R110 Nausea: Secondary | ICD-10-CM | POA: Diagnosis not present

## 2020-02-10 DIAGNOSIS — Z9981 Dependence on supplemental oxygen: Secondary | ICD-10-CM | POA: Diagnosis not present

## 2020-02-10 DIAGNOSIS — J45909 Unspecified asthma, uncomplicated: Secondary | ICD-10-CM | POA: Diagnosis not present

## 2020-02-10 DIAGNOSIS — M810 Age-related osteoporosis without current pathological fracture: Secondary | ICD-10-CM | POA: Diagnosis present

## 2020-02-10 DIAGNOSIS — E876 Hypokalemia: Secondary | ICD-10-CM | POA: Diagnosis present

## 2020-02-10 DIAGNOSIS — R059 Cough, unspecified: Secondary | ICD-10-CM

## 2020-02-10 DIAGNOSIS — Z86718 Personal history of other venous thrombosis and embolism: Secondary | ICD-10-CM

## 2020-02-10 DIAGNOSIS — Z8572 Personal history of non-Hodgkin lymphomas: Secondary | ICD-10-CM | POA: Diagnosis not present

## 2020-02-10 DIAGNOSIS — R0602 Shortness of breath: Secondary | ICD-10-CM | POA: Diagnosis not present

## 2020-02-10 DIAGNOSIS — C73 Malignant neoplasm of thyroid gland: Secondary | ICD-10-CM

## 2020-02-10 DIAGNOSIS — Z8541 Personal history of malignant neoplasm of cervix uteri: Secondary | ICD-10-CM

## 2020-02-10 DIAGNOSIS — Z7982 Long term (current) use of aspirin: Secondary | ICD-10-CM

## 2020-02-10 DIAGNOSIS — G43519 Persistent migraine aura without cerebral infarction, intractable, without status migrainosus: Secondary | ICD-10-CM | POA: Diagnosis not present

## 2020-02-10 DIAGNOSIS — I82811 Embolism and thrombosis of superficial veins of right lower extremities: Secondary | ICD-10-CM

## 2020-02-10 DIAGNOSIS — J984 Other disorders of lung: Secondary | ICD-10-CM | POA: Diagnosis not present

## 2020-02-10 DIAGNOSIS — Z95828 Presence of other vascular implants and grafts: Secondary | ICD-10-CM

## 2020-02-10 DIAGNOSIS — I351 Nonrheumatic aortic (valve) insufficiency: Secondary | ICD-10-CM | POA: Diagnosis not present

## 2020-02-10 DIAGNOSIS — Z7951 Long term (current) use of inhaled steroids: Secondary | ICD-10-CM

## 2020-02-10 DIAGNOSIS — R630 Anorexia: Secondary | ICD-10-CM

## 2020-02-10 DIAGNOSIS — Z9013 Acquired absence of bilateral breasts and nipples: Secondary | ICD-10-CM

## 2020-02-10 DIAGNOSIS — R600 Localized edema: Secondary | ICD-10-CM | POA: Diagnosis not present

## 2020-02-10 DIAGNOSIS — R601 Generalized edema: Secondary | ICD-10-CM | POA: Diagnosis not present

## 2020-02-10 LAB — CBC WITH DIFFERENTIAL (CANCER CENTER ONLY)
Abs Immature Granulocytes: 0.02 10*3/uL (ref 0.00–0.07)
Basophils Absolute: 0.1 10*3/uL (ref 0.0–0.1)
Basophils Relative: 1 %
Eosinophils Absolute: 0.2 10*3/uL (ref 0.0–0.5)
Eosinophils Relative: 3 %
HCT: 39.3 % (ref 36.0–46.0)
Hemoglobin: 12.7 g/dL (ref 12.0–15.0)
Immature Granulocytes: 0 %
Lymphocytes Relative: 34 %
Lymphs Abs: 2.4 10*3/uL (ref 0.7–4.0)
MCH: 29.5 pg (ref 26.0–34.0)
MCHC: 32.3 g/dL (ref 30.0–36.0)
MCV: 91.4 fL (ref 80.0–100.0)
Monocytes Absolute: 0.7 10*3/uL (ref 0.1–1.0)
Monocytes Relative: 10 %
Neutro Abs: 3.7 10*3/uL (ref 1.7–7.7)
Neutrophils Relative %: 52 %
Platelet Count: 297 10*3/uL (ref 150–400)
RBC: 4.3 MIL/uL (ref 3.87–5.11)
RDW: 14.2 % (ref 11.5–15.5)
WBC Count: 7.1 10*3/uL (ref 4.0–10.5)
nRBC: 0 % (ref 0.0–0.2)

## 2020-02-10 LAB — SARS CORONAVIRUS 2 BY RT PCR (HOSPITAL ORDER, PERFORMED IN ~~LOC~~ HOSPITAL LAB): SARS Coronavirus 2: NEGATIVE

## 2020-02-10 LAB — T4, FREE: Free T4: 1.07 ng/dL (ref 0.61–1.12)

## 2020-02-10 LAB — CMP (CANCER CENTER ONLY)
ALT: 43 U/L (ref 0–44)
AST: 36 U/L (ref 15–41)
Albumin: 3.7 g/dL (ref 3.5–5.0)
Alkaline Phosphatase: 59 U/L (ref 38–126)
Anion gap: 11 (ref 5–15)
BUN: 15 mg/dL (ref 6–20)
CO2: 24 mmol/L (ref 22–32)
Calcium: 8.4 mg/dL — ABNORMAL LOW (ref 8.9–10.3)
Chloride: 106 mmol/L (ref 98–111)
Creatinine: 0.99 mg/dL (ref 0.44–1.00)
GFR, Est AFR Am: >60 mL/min (ref >60)
GFR, Estimated: >60 mL/min (ref >60)
Glucose, Bld: 98 mg/dL (ref 70–99)
Potassium: 3.4 mmol/L — ABNORMAL LOW (ref 3.5–5.1)
Sodium: 141 mmol/L (ref 135–145)
Total Bilirubin: 0.4 mg/dL (ref 0.3–1.2)
Total Protein: 7.3 g/dL (ref 6.5–8.1)

## 2020-02-10 LAB — SAMPLE TO BLOOD BANK

## 2020-02-10 LAB — CORTISOL: Cortisol, Plasma: 5.9 ug/dL

## 2020-02-10 LAB — TSH: TSH: 0.06 u[IU]/mL — ABNORMAL LOW (ref 0.350–4.500)

## 2020-02-10 MED ORDER — SUCRALFATE 1 G PO TABS: 1.0000 g | ORAL_TABLET | Freq: Every day | ORAL | Status: DC | PRN

## 2020-02-10 MED ORDER — HYDROCORTISONE 10 MG PO TABS: 10.0000 mg | ORAL_TABLET | Freq: Every day | ORAL | Status: DC

## 2020-02-10 MED ORDER — BECLOMETHASONE DIPROPIONATE 80 MCG/ACT IN AERS: 1.0000 | INHALATION_SPRAY | Freq: Two times a day (BID) | RESPIRATORY_TRACT | Status: DC

## 2020-02-10 MED ORDER — CLONAZEPAM 0.5 MG PO TABS
1.0000 mg | ORAL_TABLET | Freq: Two times a day (BID) | ORAL | Status: DC
  Administered 2020-02-11 – 2020-02-14 (×4): 1 mg via ORAL
  Filled 2020-02-10 (×5): qty 2

## 2020-02-10 MED ORDER — FENTANYL CITRATE (PF) 100 MCG/2ML IJ SOLN
25.0000 ug | Freq: Once | INTRAMUSCULAR | Status: AC
  Administered 2020-02-10: 25 ug via INTRAVENOUS
  Filled 2020-02-10: qty 2

## 2020-02-10 MED ORDER — HEPARIN SOD (PORK) LOCK FLUSH 100 UNIT/ML IV SOLN
500.0000 [IU] | Freq: Once | INTRAVENOUS | Status: DC | PRN
  Filled 2020-02-10: qty 5

## 2020-02-10 MED ORDER — ACETAMINOPHEN 325 MG PO TABS: 650.0000 mg | ORAL_TABLET | Freq: Four times a day (QID) | ORAL | Status: DC | PRN

## 2020-02-10 MED ORDER — ESCITALOPRAM OXALATE 10 MG PO TABS
20.0000 mg | ORAL_TABLET | Freq: Every evening | ORAL | Status: DC
  Administered 2020-02-11 – 2020-02-13 (×3): 20 mg via ORAL
  Filled 2020-02-10 (×3): qty 2

## 2020-02-10 MED ORDER — HYDROMORPHONE HCL 1 MG/ML IJ SOLN: 1.0000 mg | Freq: Four times a day (QID) | INTRAMUSCULAR | Status: DC | PRN

## 2020-02-10 MED ORDER — LEVOTHYROXINE SODIUM 112 MCG PO TABS
112.0000 ug | ORAL_TABLET | Freq: Every day | ORAL | Status: DC
  Administered 2020-02-11 – 2020-02-14 (×4): 112 ug via ORAL
  Filled 2020-02-10 (×4): qty 1

## 2020-02-10 MED ORDER — RIMEGEPANT SULFATE 75 MG PO TBDP: 75.0000 mg | ORAL_TABLET | Freq: Every day | ORAL | Status: DC | PRN

## 2020-02-10 MED ORDER — HYDROMORPHONE HCL 2 MG/ML IJ SOLN
INTRAMUSCULAR | Status: AC
  Filled 2020-02-10: qty 1

## 2020-02-10 MED ORDER — ALBUTEROL SULFATE HFA 108 (90 BASE) MCG/ACT IN AERS: 2.0000 | INHALATION_SPRAY | Freq: Four times a day (QID) | RESPIRATORY_TRACT | Status: DC | PRN

## 2020-02-10 MED ORDER — HYDROCORTISONE 10 MG PO TABS: 10.0000 mg | ORAL_TABLET | Freq: Every evening | ORAL | Status: DC

## 2020-02-10 MED ORDER — HYDROMORPHONE HCL 1 MG/ML IJ SOLN
1.0000 mg | Freq: Once | INTRAMUSCULAR | Status: AC
  Administered 2020-02-10: 12:00:00 1 mg via INTRAVENOUS

## 2020-02-10 MED ORDER — BUDESONIDE 0.25 MG/2ML IN SUSP
0.2500 mg | Freq: Two times a day (BID) | RESPIRATORY_TRACT | Status: DC
  Administered 2020-02-11 – 2020-02-14 (×6): 0.25 mg via RESPIRATORY_TRACT
  Filled 2020-02-10 (×8): qty 2

## 2020-02-10 MED ORDER — SODIUM CHLORIDE 0.9 % IV SOLN: INTRAVENOUS | Status: AC

## 2020-02-10 MED ORDER — DIPHENHYDRAMINE HCL 50 MG/ML IJ SOLN
50.0000 mg | Freq: Four times a day (QID) | INTRAMUSCULAR | Status: DC | PRN
  Filled 2020-02-10: qty 1

## 2020-02-10 MED ORDER — ESTRADIOL 1 MG PO TABS
2.0000 mg | ORAL_TABLET | Freq: Every evening | ORAL | Status: DC
  Administered 2020-02-11 – 2020-02-13 (×2): 2 mg via ORAL
  Filled 2020-02-10 (×5): qty 2

## 2020-02-10 MED ORDER — HYDROCORTISONE 10 MG PO TABS: 10.0000 mg | ORAL_TABLET | ORAL | Status: DC

## 2020-02-10 MED ORDER — HYDROMORPHONE HCL 2 MG PO TABS
1.0000 mg | ORAL_TABLET | Freq: Four times a day (QID) | ORAL | Status: DC | PRN
  Administered 2020-02-10 – 2020-02-13 (×10): 2 mg via ORAL
  Filled 2020-02-10 (×11): qty 1

## 2020-02-10 MED ORDER — IOHEXOL 350 MG/ML SOLN
100.0000 mL | Freq: Once | INTRAVENOUS | Status: AC | PRN
  Administered 2020-02-10: 100 mL via INTRAVENOUS

## 2020-02-10 MED ORDER — HYDROMORPHONE HCL 1 MG/ML IJ SOLN
INTRAMUSCULAR | Status: AC
  Filled 2020-02-10: qty 1

## 2020-02-10 MED ORDER — ROSUVASTATIN CALCIUM 10 MG PO TABS
10.0000 mg | ORAL_TABLET | Freq: Every day | ORAL | Status: DC
  Administered 2020-02-11 – 2020-02-14 (×4): 10 mg via ORAL
  Filled 2020-02-10 (×4): qty 1

## 2020-02-10 MED ORDER — COLCHICINE 0.6 MG PO TABS
0.6000 mg | ORAL_TABLET | Freq: Every day | ORAL | Status: DC
  Administered 2020-02-12: 0.6 mg via ORAL
  Filled 2020-02-10 (×3): qty 1

## 2020-02-10 MED ORDER — OXYCODONE-ACETAMINOPHEN 5-325 MG PO TABS
1.0000 | ORAL_TABLET | Freq: Three times a day (TID) | ORAL | Status: DC | PRN
  Filled 2020-02-10: qty 1

## 2020-02-10 MED ORDER — LORAZEPAM 2 MG/ML IJ SOLN
1.0000 mg | Freq: Once | INTRAMUSCULAR | Status: AC
  Administered 2020-02-10: 1 mg via INTRAVENOUS
  Filled 2020-02-10: qty 1

## 2020-02-10 MED ORDER — DIPHENHYDRAMINE HCL 50 MG/ML IJ SOLN
25.0000 mg | Freq: Once | INTRAMUSCULAR | Status: AC
  Administered 2020-02-10: 25 mg via INTRAVENOUS

## 2020-02-10 MED ORDER — TOPIRAMATE 25 MG PO TABS
150.0000 mg | ORAL_TABLET | Freq: Every day | ORAL | Status: DC
  Administered 2020-02-10 – 2020-02-13 (×4): 150 mg via ORAL
  Filled 2020-02-10: qty 2
  Filled 2020-02-10: qty 1
  Filled 2020-02-10 (×2): qty 2

## 2020-02-10 MED ORDER — ZOLPIDEM TARTRATE 5 MG PO TABS
5.0000 mg | ORAL_TABLET | Freq: Every evening | ORAL | Status: DC | PRN
  Filled 2020-02-10: qty 1

## 2020-02-10 MED ORDER — LORAZEPAM 0.5 MG PO TABS
0.5000 mg | ORAL_TABLET | Freq: Two times a day (BID) | ORAL | Status: DC | PRN
  Administered 2020-02-11 (×2): 0.5 mg via ORAL
  Administered 2020-02-12: 1 mg via ORAL
  Filled 2020-02-10: qty 2
  Filled 2020-02-10 (×2): qty 1
  Filled 2020-02-10: qty 2

## 2020-02-10 MED ORDER — DIPHENHYDRAMINE HCL 50 MG/ML IJ SOLN
INTRAMUSCULAR | Status: AC
  Filled 2020-02-10: qty 1

## 2020-02-10 MED ORDER — GI COCKTAIL ~~LOC~~
30.0000 mL | Freq: Every day | ORAL | Status: DC | PRN
  Filled 2020-02-10: qty 30

## 2020-02-10 MED ORDER — ASPIRIN 325 MG PO TABS
325.0000 mg | ORAL_TABLET | Freq: Every day | ORAL | Status: DC
  Administered 2020-02-10 – 2020-02-13 (×4): 325 mg via ORAL
  Filled 2020-02-10 (×4): qty 1

## 2020-02-10 MED ORDER — DIPHENHYDRAMINE HCL 50 MG/ML IJ SOLN
12.5000 mg | Freq: Once | INTRAMUSCULAR | Status: AC
  Administered 2020-02-10: 14:00:00 12.5 mg via INTRAVENOUS
  Filled 2020-02-10: qty 1

## 2020-02-10 MED ORDER — HYDROMORPHONE HCL 4 MG/ML IJ SOLN
1.0000 mg | Freq: Once | INTRAMUSCULAR | Status: DC
  Filled 2020-02-10: qty 1

## 2020-02-10 MED ORDER — SODIUM CHLORIDE (PF) 0.9 % IJ SOLN
INTRAMUSCULAR | Status: AC
  Filled 2020-02-10: qty 50

## 2020-02-10 MED ORDER — ALBUTEROL SULFATE (2.5 MG/3ML) 0.083% IN NEBU
2.5000 mg | INHALATION_SOLUTION | RESPIRATORY_TRACT | Status: DC | PRN
  Administered 2020-02-11 – 2020-02-12 (×2): 2.5 mg via RESPIRATORY_TRACT
  Filled 2020-02-10 (×4): qty 3

## 2020-02-10 MED ORDER — HYDROCORTISONE 20 MG PO TABS: 20.0000 mg | ORAL_TABLET | Freq: Every day | ORAL | Status: DC

## 2020-02-10 MED ORDER — FLUTICASONE FUROATE-VILANTEROL 200-25 MCG/INH IN AEPB
1.0000 | INHALATION_SPRAY | Freq: Every day | RESPIRATORY_TRACT | Status: DC
  Administered 2020-02-11 – 2020-02-14 (×4): 1 via RESPIRATORY_TRACT
  Filled 2020-02-10: qty 28

## 2020-02-10 MED ORDER — SODIUM CHLORIDE 0.9% FLUSH
10.0000 mL | INTRAVENOUS | Status: DC | PRN
  Administered 2020-02-10: 10 mL
  Filled 2020-02-10: qty 10

## 2020-02-10 MED ORDER — SODIUM CHLORIDE 0.9 % IV SOLN
INTRAVENOUS | Status: DC
  Filled 2020-02-10: qty 250

## 2020-02-10 MED ORDER — BENZONATATE 100 MG PO CAPS: 200.0000 mg | ORAL_CAPSULE | Freq: Three times a day (TID) | ORAL | Status: DC | PRN

## 2020-02-10 MED ORDER — KETOROLAC TROMETHAMINE 30 MG/ML IJ SOLN
30.0000 mg | Freq: Four times a day (QID) | INTRAMUSCULAR | Status: DC | PRN
  Administered 2020-02-10 – 2020-02-11 (×2): 30 mg via INTRAVENOUS
  Filled 2020-02-10 (×2): qty 1

## 2020-02-10 MED ORDER — METOPROLOL SUCCINATE ER 25 MG PO TB24
37.5000 mg | ORAL_TABLET | Freq: Every day | ORAL | Status: DC
  Administered 2020-02-11 – 2020-02-13 (×3): 37.5 mg via ORAL
  Administered 2020-02-14: 50 mg via ORAL
  Filled 2020-02-10 (×4): qty 2

## 2020-02-10 MED ORDER — DOXYCYCLINE HYCLATE 100 MG PO TABS: 100.0000 mg | ORAL_TABLET | Freq: Two times a day (BID) | ORAL | Status: DC

## 2020-02-10 MED ORDER — ENOXAPARIN SODIUM 40 MG/0.4ML ~~LOC~~ SOLN
40.0000 mg | SUBCUTANEOUS | Status: DC
  Filled 2020-02-10: qty 0.4

## 2020-02-10 MED ORDER — HYDROCORTISONE 20 MG PO TABS
20.0000 mg | ORAL_TABLET | Freq: Every day | ORAL | Status: DC
  Administered 2020-02-11: 20 mg via ORAL
  Filled 2020-02-10: qty 1

## 2020-02-10 MED ORDER — POTASSIUM CHLORIDE CRYS ER 20 MEQ PO TBCR
20.0000 meq | EXTENDED_RELEASE_TABLET | Freq: Once | ORAL | Status: AC
  Administered 2020-02-11: 20 meq via ORAL
  Filled 2020-02-10: qty 1

## 2020-02-10 MED ORDER — ACETAMINOPHEN 650 MG RE SUPP: 650.0000 mg | Freq: Four times a day (QID) | RECTAL | Status: DC | PRN

## 2020-02-10 MED ORDER — DIPHENHYDRAMINE HCL 50 MG/ML IJ SOLN
25.0000 mg | Freq: Four times a day (QID) | INTRAMUSCULAR | Status: DC | PRN
  Administered 2020-02-10 – 2020-02-11 (×3): 25 mg via INTRAVENOUS
  Filled 2020-02-10 (×2): qty 1

## 2020-02-10 MED ORDER — SODIUM CHLORIDE 0.9 % IV BOLUS
1000.0000 mL | Freq: Once | INTRAVENOUS | Status: AC
  Administered 2020-02-10: 1000 mL via INTRAVENOUS

## 2020-02-10 MED ORDER — FUROSEMIDE 10 MG/ML IJ SOLN
20.0000 mg | Freq: Two times a day (BID) | INTRAMUSCULAR | Status: DC
  Administered 2020-02-11 – 2020-02-14 (×7): 20 mg via INTRAVENOUS
  Filled 2020-02-10 (×7): qty 2

## 2020-02-10 MED ORDER — GADOBUTROL 1 MMOL/ML IV SOLN
10.0000 mL | Freq: Once | INTRAVENOUS | Status: AC | PRN
  Administered 2020-02-10: 10 mL via INTRAVENOUS

## 2020-02-10 NOTE — H&P (Signed)
History and Physical    [name] CRS:[id number] DOB: 1971-03-12 DOA: 02/10/2020  PCP: Su Grand, MD   Patient coming from: Oncology office  Chief Complaint:  headache  HPI: [name] is a 49 y.o. female with medical history significant for Chronic lymphedemea, migraine/headache on nurtec/topamax/Galcanezumab q 4wk injection, lymphedema, history of thyroid cancer, chest lymphoma, breast ductal carcinoma, Addison's disease, cervical cancer, superficial venous thrombosis in lower extremity and upper extremities previously on Lovenox now on aspirin 325 mg coming to the oncology clinic with multiple symptoms complaints. She reports she has "Rt sided chf-Adriamycin induced, chronic resp failure w/ hypoxia from bleomycin on lasix and 3-4 l  O2".  She C/o headache for x few days on left parietal side, c/o dry mouth but swollen arms. She also complains of trouble breathing, blurred vision, headache fatigue, arm swelling. She was dizzy at [facility] few days ago, and  has generalized weakness and reports she almost passed out twice in last 5 days. She Called neuro office at [facility] for headache and reports they are planning for pain management- botox inj in 2 wks. She was seen in Oncology clinic and sent to ED for iv hydration but looked wet and dry- mouth and was told she is "third spacing" but " intravascularly dry" and was given NS bolus for hydration. She underwent extensive work up In ED, VS STABLE, cbc. CMP Normal, CT head normal, CXR "okay", CTA- bronchiolitis features.MR MRV brain - normal no intracranial venous thrombosis. TSH low  0.06 but improving from before 0.016 yr ago. FT4/FT3 sent, Cortisol ordered. She also received fentanyl 25 mcg x2, benadryl 12.5 mg iv, ativan 1 mg iv. ED  discussed with oncology and advised admission for " fine tuning" She also c/o cough with brownish sputum.  She otherwise denies any fever, chills, chest pain, shortness of breath, abdominal pain, focal  weakness.  She has chronic nausea takes IV Benadryl injection at home, reports she is allergic to most of the nausea medication.  She wants to try Toradol for the headache.  ED Course:  Vitals:   02/10/20 1352 02/10/20 1400 02/10/20 1500 02/10/20 1700  BP: (!) 157/103 (!) 151/93 (!) 147/70 (!) 129/101  Pulse: 90 84 87 85  Resp: _0 Temp:      TempSrc:      SpO2: 100% 100% 100% 100%   Review of Systems: All systems were reviewed and were negative except as mentioned in HPI above. Negative for fever Negative for chest pain Negative for shortness of breath  Past Medical History:  Diagnosis Date  . Addison's disease ([place])   . Adrenal insufficiency ([place])   . Anemia   . Anxiety   . Aortic stenosis   . Aortic stenosis   . Appendicitis 12/19/09  . Appendicitis   . Breast cancer ([place])    STATUS POST BILATERAL MASTECTOMY. STATUS POST RECONSTRUCTION. SHE HAD SILICONE BREAST IMPLANTS AND THE LEFT IMPLANT IS LEAKING SLIGHTLY  . Cellulitis of right middle finger 11/07/2018  . Cervical cancer ([place]) 12/23/2018  . Chest pain   . CHF with right heart failure ([place]) 04/17/2017  . Chronic respiratory failure with hypoxia ([place]) 12/23/2018  . Cough variant asthma 04/13/2019  . Depression   . GERD (gastroesophageal reflux disease)   . Headache    migraines  . Heart murmur   . History of kidney stones   . Hodgkin lymphoma (H[facility])    STATUS POST MANTLE RADIATION  . Hodgkin's lymphoma ([place])  1987  . Hypertension   . Hypoxia   . Necrotizing fasciitis ([place]) 12/23/2018  . Non-ischemic cardiomyopathy ([place])   . Osteoporosis   . Palpitations   . Pituitary adenoma ([place]) 12/23/2018  . Pneumonia   . PONV (postoperative nausea and vomiting)   . Pre-diabetes    per pt; no meds  . Pulmonary hypertension ([place]) 12/23/2018  . Raynaud phenomenon   . Right heart failure ([place]) 04/17/2017  . Supplemental oxygen dependent    3 liters  . SVT (supraventricular tachycardia) ([place])   . Tachycardia   .  Thyroid cancer ([place])    STATUS POST SURGICAL REMOVAL-CURRENT ON THYROID REPLACEMENT    Past Surgical History:  Procedure Laterality Date  . ABDOMINAL HYSTERECTOMY    . AMPUTATION Left 01/30/2019   Procedure: Left Index finger amputation with flap reconstruction and repair reconstruction;  Surgeon: Roseanne Kaufman, MD;  Location: [place];  Service: Orthopedics;  Laterality: Left;  . APPENDECTOMY    . CARDIAC CATHETERIZATION  05/18/09   NORMAL CATH  . I & D EXTREMITY Left 12/23/2018   Procedure: IRRIGATION AND DEBRIDEMENT HAND / INDEX FINGER;  Surgeon: Roseanne Kaufman, MD;  Location: [place];  Service: Orthopedics;  Laterality: Left;  Marland Kitchen MASTECTOMY    . PITUITARY SURGERY    . RIGHT/LEFT HEART CATH AND CORONARY ANGIOGRAPHY N/A 04/02/2018   Procedure: RIGHT/LEFT HEART CATH AND CORONARY ANGIOGRAPHY;  Surgeon: Burnell Blanks, MD;  Location: [place] [facility];  Service: Cardiovascular;  Laterality: N/A;  . TOTAL THYROIDECTOMY    . VIDEO BRONCHOSCOPY Bilateral 11/14/2018   Procedure: VIDEO BRONCHOSCOPY WITHOUT FLUORO;  Surgeon: Margaretha Seeds, MD;  Location: [place];  Service: Cardiopulmonary;  Laterality: Bilateral;  . VIDEO BRONCHOSCOPY WITH ENDOBRONCHIAL ULTRASOUND N/A 11/19/2018   Procedure: VIDEO BRONCHOSCOPY WITH ENDOBRONCHIAL ULTRASOUND;  Surgeon: Margaretha Seeds, MD;  Location: [place];  Service: Thoracic;  Laterality: N/A;     reports that she has never smoked. She has never used smokeless tobacco. She reports current alcohol use. She reports that she does not use drugs.  Allergies  Allergen Reactions  . Mushroom Extract Complex Anaphylaxis  . Na Ferric Gluc Cplx In Sucrose Anaphylaxis  . Cymbalta [Duloxetine Hcl] Swelling and Anxiety  . Succinylcholine Other (See Comments)    Lock Jaw  . Buprenorphine Hcl Hives  . Compazine Other (See Comments)    Altered mental status Aggression  . Metoclopramide Other (See Comments)    Dystonia  . Morphine And Related Hives  .  Ondansetron Hives and Rash  . Promethazine Hcl Hives  . Tegaderm Ag Mesh [Silver] Rash    Old formulation only, is able to tolerate new formulation    Family History  Family history unknown: Yes     Prior to Admission medications   Medication Sig Start Date End Date Taking? Authorizing Provider  albuterol (2.5 MG/3ML) 0.083% NEBU 3 mL, albuterol (5 MG/ML) 0.5% NEBU 0.5 mL Inhale 3 mg into the lungs every 6 (six) hours as needed (SOB).    Yes [provider]  albuterol (VENTOLIN HFA) 108 (90 Base) MCG/ACT inhaler Inhale 2 puffs into the lungs every 6 (six) hours as needed for wheezing or shortness of breath.   Yes [provider]  Alum & Mag Hydroxide-Simeth (GI COCKTAIL) SUSP suspension Take 30 mLs daily as needed by mouth for indigestion (BLEEDING ULCERS). Shake well.   Yes [provider]  aspirin 325 MG tablet Take 325 mg by mouth at bedtime.    Yes  [provider]  beclomethasone (QVAR) 80 MCG/ACT inhaler Inhale 1 puff into the lungs 2 (two) times daily.    Yes [provider]  clonazePAM (KLONOPIN) 1 MG tablet Take 1 mg by mouth 2 (two) times daily.    Yes [provider]  colchicine 0.6 MG tablet Take 0.6 mg by mouth daily in the afternoon.  12/31/19  Yes [provider]  dexlansoprazole (DEXILANT) 60 MG capsule Take 60 mg by mouth daily.     Yes [provider]  diphenhydrAMINE (BENADRYL) 50 MG/ML injection Inject 1 mL (50 mg total) into the vein every 6 (six) hours as needed (nausea). 12/25/18  Yes Rai, Ripudeep K, MD  EPINEPHRINE 0.3 mg/0.3 mL IJ SOAJ injection INJECT 0.3 MLS (0.3 MG TOTAL) INTO THE MUSCLE AS NEEDED FOR ANAPHYLAXIS. 12/09/19  Yes Margaretha Seeds, MD  escitalopram (LEXAPRO) 20 MG tablet Take 20 mg by mouth every evening.  04/24/16  Yes [provider]  estradiol (ESTRACE) 2 MG tablet Take 2 mg by mouth every evening.  02/01/16  Yes [provider]  fluticasone furoate-vilanterol  (BREO ELLIPTA) 200-25 MCG/INH AEPB Inhale 1 puff into the lungs daily.   Yes [provider]  furosemide (LASIX) 40 MG tablet Take 40 mg by mouth daily.    Yes [provider]  Galcanezumab-gnlm 120 MG/ML SOAJ Inject 120 mg into the skin every 28 (twenty-eight) days.  03/31/19  Yes [provider]  Heparin Lock Flush (HEPARIN NICU/SCN FLUSH) 1 UNIT/ML SOLN Heparin Lock Flush Solution - 500 USP UNITS/5 mL  #30 pack with 1 refill 02/01/20  Yes Nicholas Lose, MD  hydrocortisone (CORTEF) 10 MG tablet Take 10-20 mg by mouth See admin instructions. Take 20 mg in the am and 59m in the evening   Yes [provider]  levothyroxine (SYNTHROID, LEVOTHROID) 112 MCG tablet Take 112 mcg by mouth daily before breakfast.   Yes [provider]  LORazepam (ATIVAN) 0.5 MG tablet TAKE 1 TO 2 TABLETS BY MOUTH TWICE A DAY AS NEEDED FOR NAUSEA Patient taking differently: Take 0.5-1 mg by mouth 2 (two) times daily as needed (nausea).  01/22/20  Yes GNicholas Lose MD  Mepolizumab (NUCALA) 100 MG SOLR Inject 100 mg into the skin every 28 (twenty-eight) days. 05/13/19  Yes EMargaretha Seeds MD  metoprolol succinate (TOPROL-XL) 25 MG 24 hr tablet Take 37.5 mg by mouth daily.    Yes [provider]  oxyCODONE-acetaminophen (PERCOCET/ROXICET) 5-325 MG tablet Take 1 tablet by mouth every 8 (eight) hours as needed for severe pain. 02/01/20  Yes GNicholas Lose MD  OXYGEN Inhale 4 L into the lungs continuous.    Yes [provider]  Rimegepant Sulfate (NURTEC) 75 MG TBDP Take 75 mg by mouth daily as needed (Migraine).    Yes [provider]  rosuvastatin (CRESTOR) 10 MG tablet Take 10 mg by mouth daily.  02/28/18  Yes [provider]  silver sulfADIAZINE (SILVADENE) 1 % cream Apply 1 application topically daily as needed (Open Wound).  11/16/19  Yes [provider]  sucralfate (CARAFATE) 1 g tablet Take 1 g by mouth daily as needed (FOR ULCERS).     Yes [provider]  topiramate (TOPAMAX) 50 MG tablet Take 150 mg by mouth at bedtime.  12/25/19  Yes [provider]  zolpidem (AMBIEN CR) 12.5 MG CR tablet Take 12.5 mg by mouth at bedtime.     Yes [provider]  benzonatate (TESSALON) 200 MG capsule Take  1 capsule (200 mg total) by mouth 3 (three) times daily as needed for cough. Patient not taking: Reported on 01/17/2020 04/15/19   Martyn Ehrich, NP  sodium chloride flush 0.9 % SOLN injection 10 mL syringes - #30 count with 1 refill 02/01/20   Nicholas Lose, MD    Physical Exam: Vitals:   02/10/20 1352 02/10/20 1400 02/10/20 1500 02/10/20 1700  BP: (!) 157/103 (!) 151/93 (!) 147/70 (!) 129/101  Pulse: 90 84 87 85  Resp: _0 Temp:      TempSrc:      SpO2: 100% 100% 100% 100%   General exam: AAOx3, not in acute distress, obese, on nasal cannula oxygen home setting for later HEENT:Oral mucosa moist, Ear/Nose WNL grossly, dentition normal. Respiratory system: bilaterally diminished but no wheezing or crackles,no use of accessory muscle Cardiovascular system: S1 & S2 +, No JVD,. Gastrointestinal system: Abdomen soft, NT,ND, BS+ Nervous System:Alert, awake, moving extremities and grossly nonfocal Extremities: No edema in lower extremities, has upper extremity lymphedematous edema firm swelling, distal peripheral pulses palpable.  Skin:No rashes,no icterus. TVN:RWCHJS muscle bulk,tone,power.  Labs on Admission: I have personally reviewed following labs and imaging studies  CBC: Recent Labs  Lab 02/10/20 1020  WBC 7.1  NEUTROABS 3.7  HGB 12.7  HCT 39.3  MCV 91.4  PLT 438   Basic Metabolic Panel: Recent Labs  Lab 02/10/20 1020  NA 141  K 3.4*  CL 106  CO2 24  GLUCOSE 98  BUN 15  CREATININE 0.99  CALCIUM 8.4*   GFR: Estimated Creatinine Clearance: 75.8 mL/min (by C-G formula based on SCr of 0.99 mg/dL). Liver Function Tests: Recent Labs  Lab 02/10/20 1020  AST 36  ALT 43    ALKPHOS 59  BILITOT 0.4  PROT 7.3  ALBUMIN 3.7   No results for input(s): LIPASE, AMYLASE in the last 168 hours. No results for input(s): AMMONIA in the last 168 hours. Coagulation Profile: No results for input(s): INR, PROTIME in the last 168 hours. Cardiac Enzymes: No results for input(s): CKTOTAL, CKMB, CKMBINDEX, TROPONINI in the last 168 hours. BNP (last 3 results) No results for input(s): PROBNP in the last 8760 hours. HbA1C: No results for input(s): HGBA1C in the last 72 hours. CBG: No results for input(s): GLUCAP in the last 168 hours. Lipid Profile: No results for input(s): CHOL, HDL, LDLCALC, TRIG, CHOLHDL, LDLDIRECT in the last 72 hours. Thyroid Function Tests: Recent Labs    02/10/20 1241  TSH 0.060*   Anemia Panel: No results for input(s): VITAMINB12, FOLATE, FERRITIN, TIBC, IRON, RETICCTPCT in the last 72 hours. Urine analysis:    Component Value Date/Time   COLORURINE STRAW (A) 01/17/2020 1440   APPEARANCEUR CLEAR 01/17/2020 1440   LABSPEC 1.009 01/17/2020 1440   PHURINE 6.0 01/17/2020 1440   GLUCOSEU NEGATIVE 01/17/2020 1440   HGBUR NEGATIVE 01/17/2020 1440   BILIRUBINUR NEGATIVE 01/17/2020 1440   KETONESUR NEGATIVE 01/17/2020 1440   PROTEINUR NEGATIVE 01/17/2020 1440   UROBILINOGEN 1.0 06/16/2015 1045   NITRITE NEGATIVE 01/17/2020 1440   LEUKOCYTESUR NEGATIVE 01/17/2020 1440    Radiological Exams on Admission: CT Head Wo Contrast  Result Date: 02/10/2020 CLINICAL DATA:  Headache, history of multiple cancers EXAM: CT HEAD WITHOUT CONTRAST TECHNIQUE: Contiguous axial images were obtained from the base of the skull through the vertex without intravenous contrast. COMPARISON:  2013 FINDINGS: Brain: There is no acute intracranial hemorrhage, mass effect, or edema. Gray-white differentiation is preserved. There is no extra-axial fluid collection.  Ventricles and sulci are within normal limits in size and configuration. Vascular: No hyperdense vessel or  unexpected calcification. Skull: Calvarium is unremarkable. Sinuses/Orbits: No acute finding. Other: None. IMPRESSION: No acute intracranial abnormality. Electronically Signed   By: Macy Mis M.D.   On: 02/10/2020 13:49   CT Angio Chest PE W and/or Wo Contrast  Result Date: 02/10/2020 CLINICAL DATA:  Shortness of breath, hemoptysis, cough. History of multiple cancers EXAM: CT ANGIOGRAPHY CHEST WITH CONTRAST TECHNIQUE: Multidetector CT imaging of the chest was performed using the standard protocol during bolus administration of intravenous contrast. Multiplanar CT image reconstructions and MIPs were obtained to evaluate the vascular anatomy. CONTRAST:  13m OMNIPAQUE IOHEXOL 350 MG/ML SOLN COMPARISON:  12/29/2019 FINDINGS: Cardiovascular: Satisfactory opacification of the pulmonary arteries to the segmental level. No evidence of pulmonary embolism. Normal heart size. No pericardial effusion. Thoracic aorta is nonaneurysmal. Minimal atherosclerotic calcification of the aortic arch. Right-sided chest port is seen with distal tip terminating at the superior cavoatrial junction. Mediastinum/Nodes: Prior right axillary lymph node dissection. No axillary lymphadenopathy. No enlarged mediastinal or hilar lymph nodes. Status post thyroidectomy. Trachea and esophagus are within normal limits. Lungs/Pleura: Stable subpleural fat containing nodule along the posterior right hemidiaphragm (series 6, image 97) is unchanged from 2016, benign. No new pulmonary nodule. There is mosaic attenuation of the bilateral lung fields. No focal lobar consolidation. No pleural effusion or pneumothorax. Upper Abdomen: No acute abnormality. Musculoskeletal: Bilateral breast prostheses. No acute osseous abnormality. No suspicious lytic or sclerotic bone lesion. Review of the MIP images confirms the above findings. IMPRESSION: 1. No evidence of pulmonary embolism. 2. Mosaic attenuation of the bilateral lung fields, which can be seen with  small airways disease/bronchiolitis. A Electronically Signed   By: NDavina PokeD.O.   On: 02/10/2020 14:59   DG Chest Portable 1 View  Result Date: 02/10/2020 CLINICAL DATA:  Shortness of breath. EXAM: PORTABLE CHEST 1 VIEW COMPARISON:  July 09, 2019. FINDINGS: The heart size and mediastinal contours are within normal limits. Stable right internal jugular Port-A-Cath is noted. Minimal bibasilar subsegmental atelectasis is noted. No pneumothorax or pleural effusion is noted. The visualized skeletal structures are unremarkable. IMPRESSION: Minimal bibasilar subsegmental atelectasis. Electronically Signed   By: JMarijo ConceptionM.D.   On: 02/10/2020 13:08   MR MRV HEAD W WO CONTRAST  Result Date: 02/10/2020 CLINICAL DATA:  History of breast cancer and lymphoma. History of thyroid cancer and cervical cancer. Headache. Assess for intracranial venous thrombosis. EXAM: MR VENOGRAM HEAD WITHOUT AND WITH CONTRAST TECHNIQUE: Angiographic images of the intracranial venous structures were obtained using MRV technique without and with intravenous contrast. CONTRAST:  152mGADAVIST GADOBUTROL 1 MMOL/ML IV SOLN COMPARISON:  Head CT same day FINDINGS: The study is normal. Superior sagittal sinus is patent and normal. Transverse sinuses are normal. Sigmoid sinuses are normal. Jugular veins both show flow. Deep veins are patent. No evidence of superficial venous thrombosis. No abnormal brain enhancement seen on limited parenchymal imaging. IMPRESSION: Normal study. No cause of headache identified. No evidence of intracranial venous thrombosis. Electronically Signed   By: MaNelson Chimes.D.   On: 02/10/2020 16:16     Assessment/Plan  Left-sided headache, history of migraine:on nurtec/topamax/Galcanezumab q 4wk injection at home.  Resume home medication add IV Toradol as needed, continue IV Benadryl, Ativan.  She has a follow-up with [facility] neurology/pain clinic for Botox trial for her headache.  CT head MRV reviewed  no acute finding.  Nonfocal on exam.  Generalized weakness/dizziness:  Check cortisol level, orthostatic blood pressure continue gentle IV fluid hydration-as patient feels dehydrated dry mouth.  PT eval  Upper extremity lymphedema, worsening edema history of right-sided heart failure: Chest x-ray no features of CHF, CTA chest unremarkable for PE.  Hold home Lasix and add gentle IV Lasix twice daily, ordered BNP please follow-up.  Sputum production, CTA shows bronchiolitis we will add doxycycline, continue home inhalers/qvar.  Hypokalemia repleted orally  Chronic hypoxic respiratory failure/right-sided CHF: Continue home oxygen 3 to 4 L nasal cannula.  history of thyroid cancer, TSH low, patient reports chronically low. Check free T3 and T4 follow with outpatient endocrine.  No home Synthroid.  Chest lymphoma: Patient is scheduled to get MRI sternum by Dr. Lindi Adie.  Addison's disease: No features suggestive of Addison's crisis, check orthostatic blood pressure, cortisol has been ordered and pending.  Resume home hydrocortisone  HX OF venous thrombosis in lower extremity and upper extremities previously on Lovenox now on aspirin 325 mg  Psych issues continue her Klonopin, Lexapro, Ambien Benadryl Ativan prn  Multiple complaints as above, cont treatment as outlined. If symptoms persistent  Consider consult/discussion with her oncologist Dr Lindi Adie, well known to her.  There is no height or weight on file to calculate BMI.   Severity of Illness: The appropriate patient status for this patient is OBSERVATION. Observation status is judged to be reasonable and necessary in order to provide the required intensity of service to ensure the patient's safety. The patient's presenting symptoms, physical exam findings, and initial radiographic and laboratory data in the context of their medical condition is felt to place them at decreased risk for further clinical deterioration. Furthermore, it is  anticipated that the patient will be medically stable for discharge from the hospital within 2 midnights of admission. The following factors support the patient status of observation.    DVT prophylaxis:  SCD/Lovenox Code Status: Full code Family Communication: Admission, patients condition and plan of care including tests being ordered have been discussed with the patient who indicate understanding and agree with the plan and Code Status.  Consults called:   Antonieta Pert MD [facility]  If 7PM-7AM, please contact night-coverage [email or web address]  02/10/2020, 5:49 PM

## 2020-02-10 NOTE — ED Triage Notes (Signed)
Patient presents from the cancer center for concerns of blurred vision, headache, and 6lb weight gain in the past few days. Patient has a significant medical hx and is currently on only aspirin for blood thinning and has a hx of blood clots.  Patient was given 63m of dilaudid by Cancer center staff as well as 295mof IV benadryl by Cancer center staff.

## 2020-02-10 NOTE — Patient Instructions (Addendum)

## 2020-02-10 NOTE — ED Notes (Signed)
Patient requesting fluids as she feels dry, requesting pain medication, and ice chips. PA approved of ice chips and reports she will address the fluids and pain medication.

## 2020-02-10 NOTE — ED Provider Notes (Signed)
[facility] DEPT Provider Note   CSN: [id number] Arrival date & time: 02/10/20  1202     History Chief Complaint  Patient presents with   Migraine   Arm Swelling   Fatigue    [name] is a 49 y.o. female.  HPI 49 year-old female with complex oncologic history of thyroid cancer, chest lymphoma, breast ductal carcinoma, Addison's, cervical cancer, superficial venous thrombosis in her lower extremity and upper extremities, hypertension presents to the ER with worsening shortness of breath, migraine unrelieved by her regular medication, increasing feeling of malaise and fatigue, and progressive swelling to her hands and feet over the last 5 days. She states that she went to [place] with her husband the other day and was so winded and tired in the store that she got dizzy and had to sit down. She notes a 6lb weight gain in the last few days. She reports brownish colored sputum and increasing shortness of breath is normally on home 3-4 L of home O2, has not noted an increase in O2 requirements but states that her O2 sats did drop to 84% when ambulating.  She states she has tried her Soaj, Nurtec, and 1 other migraine medication she cannot member the name of without relief of her migraine. She also states that she has been off Lovenox injections and has been on 325 of aspirin since October.   She also reports blurry vision, though she does state that this is consistent with her history of migraines.  She denies any nausea, vomiting, abdominal pain but does states she feels very fluid overloaded but thirsty and dry. Denies any fevers, chills, acute chest pain (she feels chronic chest pain from a lesion found on her sternum for which she is scheduled to have an MRI in a few days, back pain, syncope, dysuria, hematuria, diarrhea, hematochezia, constipation, unilateral weakness or sensory deficits . Pt is followed by Dr. Lindi Adie w/ Heme/Onc. She was seen in their symptom  management clinic by Earna Coder and was personally brought over by him for " fluid management and possible need for MRI.     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 STATUS POST RECONSTRUCTION. SHE HAD SILICONE BREAST IMPLANTS AND THE LEFT IMPLANT IS LEAKING SLIGHTLY   Cellulitis of right middle finger 11/07/2018   Cervical cancer ([address]) 12/23/2018   Chest pain    CHF with right heart failure ([place]) 04/17/2017   Chronic respiratory failure with hypoxia (HCC) 12/23/2018   Cough variant asthma 04/13/2019   Depression    GERD (gastroesophageal reflux disease)    Headache    migraines   Heart murmur    History of kidney stones    Hodgkin lymphoma ([place])    STATUS POST MANTLE RADIATION   Hodgkin's lymphoma ([place])    1987   Hypertension    Hypoxia    Necrotizing fasciitis ([place]) 12/23/2018   Non-ischemic cardiomyopathy ([place])    Osteoporosis    Palpitations    Pituitary adenoma ([place]) 12/23/2018   Pneumonia    PONV (postoperative nausea and vomiting)    Pre-diabetes    per pt; no meds   Pulmonary hypertension ([place]) 12/23/2018   Raynaud phenomenon  Right heart failure (HCC) 04/17/2017   Supplemental oxygen dependent    3 liters   SVT (supraventricular tachycardia) (HCC)    Tachycardia    Thyroid cancer ([place])    STATUS POST SURGICAL REMOVAL-CURRENT ON THYROID REPLACEMENT    Patient Active Problem List   Diagnosis Date Noted   Generalized weakness 02/10/2020   Subcutaneous nodule 02/01/2020   Port-A-Cath in place 09/09/2019   Moderate persistent asthma without complication 07/25/8526   Acute superficial venous thrombosis of lower extremity, right 07/16/2019   Injury of left index finger 12/24/2018   Pituitary adenoma ([place])  12/23/2018   History of cervical cancer 12/23/2018   Chronic respiratory failure with hypoxia ([place]) 12/23/2018   SOB (shortness of breath)    Nephrolithiasis 11/07/2018   Oxygen dependent 11/07/2018   Migraine 11/07/2018   PVC's (premature ventricular contractions) 10/27/2018   Hyperlipidemia 03/09/2014   Hx of Hodgkins lymphoma    History of ductal carcinoma in situ (DCIS) of breast    Thyroid cancer ([place])    Adrenal insufficiency ([place])    Hypertension    Raynaud phenomenon    GERD (gastroesophageal reflux disease)     Past Surgical History:  Procedure Laterality Date   ABDOMINAL HYSTERECTOMY     AMPUTATION Left 01/30/2019   Procedure: Left Index finger amputation with flap reconstruction and repair reconstruction;  Surgeon: Roseanne Kaufman, MD;  Location: [facility];  Service: Orthopedics;  Laterality: Left;   APPENDECTOMY     CARDIAC CATHETERIZATION  05/18/09   NORMAL CATH   I & D EXTREMITY Left 12/23/2018   Procedure: IRRIGATION AND DEBRIDEMENT HAND / INDEX FINGER;  Surgeon: Roseanne Kaufman, MD;  Location: [facility];  Service: Orthopedics;  Laterality: Left;   MASTECTOMY     PITUITARY SURGERY     RIGHT/LEFT HEART CATH AND CORONARY ANGIOGRAPHY N/A 04/02/2018   Procedure: RIGHT/LEFT HEART CATH AND CORONARY ANGIOGRAPHY;  Surgeon: Burnell Blanks, MD;  Location: [facility];  Service: Cardiovascular;  Laterality: N/A;   TOTAL THYROIDECTOMY     VIDEO BRONCHOSCOPY Bilateral 11/14/2018   Procedure: VIDEO BRONCHOSCOPY WITHOUT FLUORO;  Surgeon: Margaretha Seeds, MD;  Location: [facility];  Service: Cardiopulmonary;  Laterality: Bilateral;   VIDEO BRONCHOSCOPY WITH ENDOBRONCHIAL ULTRASOUND N/A 11/19/2018   Procedure: VIDEO BRONCHOSCOPY WITH ENDOBRONCHIAL ULTRASOUND;  Surgeon: Margaretha Seeds, MD;  Location: [facility];  Service: Thoracic;  Laterality: N/A;     OB History   No obstetric history on file.     Family History  Family history unknown: Yes     Social History   Tobacco Use   Smoking status: Never Smoker   Smokeless tobacco: Never Used  Substance Use Topics   Alcohol use: Yes    Comment: social    Drug use: No    Home Medications Prior to Admission medications   Medication Sig Start Date End Date Taking? Authorizing Provider  albuterol (2.5 MG/3ML) 0.083% NEBU 3 mL, albuterol (5 MG/ML) 0.5% NEBU 0.5 mL Inhale 3 mg into the lungs every 6 (six) hours as needed (SOB).    Yes [provider]  albuterol (VENTOLIN HFA) 108 (90 Base) MCG/ACT inhaler Inhale 2 puffs into the lungs every 6 (six) hours as needed for wheezing or shortness of breath.   Yes [provider]  Alum & Mag Hydroxide-Simeth (GI COCKTAIL) SUSP suspension Take 30 mLs daily as needed by mouth for indigestion (BLEEDING ULCERS). Shake well.   Yes [provider]  aspirin 325 MG tablet Take 325  mg by mouth at bedtime.    Yes [provider]  beclomethasone (QVAR) 80 MCG/ACT inhaler Inhale 1 puff into the lungs 2 (two) times daily.    Yes [provider]  clonazePAM (KLONOPIN) 1 MG tablet Take 1 mg by mouth 2 (two) times daily.    Yes [provider]  colchicine 0.6 MG tablet Take 0.6 mg by mouth daily in the afternoon.  12/31/19  Yes [provider]  dexlansoprazole (DEXILANT) 60 MG capsule Take 60 mg by mouth daily.     Yes [provider]  diphenhydrAMINE (BENADRYL) 50 MG/ML injection Inject 1 mL (50 mg total) into the vein every 6 (six) hours as needed (nausea). 12/25/18  Yes Rai, Ripudeep K, MD  EPINEPHRINE 0.3 mg/0.3 mL IJ SOAJ injection INJECT 0.3 MLS (0.3 MG TOTAL) INTO THE MUSCLE AS NEEDED FOR ANAPHYLAXIS. 12/09/19  Yes Margaretha Seeds, MD  escitalopram (LEXAPRO) 20 MG tablet Take 20 mg by mouth every evening.  04/24/16  Yes [provider]  estradiol (ESTRACE) 2 MG tablet Take 2 mg by mouth every evening.  02/01/16  Yes [provider]  fluticasone furoate-vilanterol  (BREO ELLIPTA) 200-25 MCG/INH AEPB Inhale 1 puff into the lungs daily.   Yes [provider]  furosemide (LASIX) 40 MG tablet Take 40 mg by mouth daily.    Yes [provider]  Galcanezumab-gnlm 120 MG/ML SOAJ Inject 120 mg into the skin every 28 (twenty-eight) days.  03/31/19  Yes [provider]  Heparin Lock Flush (HEPARIN NICU/SCN FLUSH) 1 UNIT/ML SOLN Heparin Lock Flush Solution - 500 USP UNITS/5 mL  #30 pack with 1 refill 02/01/20  Yes Nicholas Lose, MD  hydrocortisone (CORTEF) 10 MG tablet Take 10-20 mg by mouth See admin instructions. Take 20 mg in the am and 36m in the evening   Yes [provider]  levothyroxine (SYNTHROID, LEVOTHROID) 112 MCG tablet Take 112 mcg by mouth daily before breakfast.   Yes [provider]  LORazepam (ATIVAN) 0.5 MG tablet TAKE 1 TO 2 TABLETS BY MOUTH TWICE A DAY AS NEEDED FOR NAUSEA Patient taking differently: Take 0.5-1 mg by mouth 2 (two) times daily as needed (nausea).  01/22/20  Yes GNicholas Lose MD  Mepolizumab (NUCALA) 100 MG SOLR Inject 100 mg into the skin every 28 (twenty-eight) days. 05/13/19  Yes EMargaretha Seeds MD  metoprolol succinate (TOPROL-XL) 25 MG 24 hr tablet Take 37.5 mg by mouth daily.    Yes [provider]  oxyCODONE-acetaminophen (PERCOCET/ROXICET) 5-325 MG tablet Take 1 tablet by mouth every 8 (eight) hours as needed for severe pain. 02/01/20  Yes GNicholas Lose MD  OXYGEN Inhale 4 L into the lungs continuous.    Yes [provider]  Rimegepant Sulfate (NURTEC) 75 MG TBDP Take 75 mg by mouth daily as needed (Migraine).    Yes [provider]  rosuvastatin (CRESTOR) 10 MG tablet Take 10 mg by mouth daily.  02/28/18  Yes [provider]  silver sulfADIAZINE (SILVADENE) 1 % cream Apply 1 application topically daily as needed (Open Wound).  11/16/19  Yes [provider]  sucralfate (CARAFATE) 1 g tablet Take 1 g by mouth daily as needed (FOR ULCERS).     Yes [provider]  topiramate (TOPAMAX) 50 MG tablet Take 150 mg by mouth at bedtime.  12/25/19  Yes [provider]  zolpidem (AMBIEN CR) 12.5 MG CR tablet Take 12.5 mg by mouth at bedtime.     Yes [provider]  benzonatate (TESSALON) 200 MG capsule Take 1 capsule (200 mg total) by mouth 3 (three) times daily as needed for cough. Patient not taking: Reported on 01/17/2020 04/15/19   Martyn Ehrich, NP  sodium chloride flush 0.9 % SOLN injection 10 mL syringes - #30 count with 1 refill 02/01/20   Nicholas Lose, MD    Allergies    Mushroom extract complex, Na ferric gluc cplx in sucrose, Cymbalta [duloxetine hcl], Succinylcholine, Buprenorphine hcl, Compazine, Metoclopramide, Morphine and related, Ondansetron, Promethazine hcl, and Tegaderm ag mesh [silver]  Review of Systems   Review of Systems  Constitutional: Positive for activity change and fatigue. Negative for chills and fever.  HENT: Negative for ear pain and sore throat.   Eyes: Positive for visual disturbance. Negative for pain.  Respiratory: Positive for shortness of breath. Negative for cough.   Cardiovascular: Positive for chest pain and leg swelling. Negative for palpitations.  Gastrointestinal: Negative for abdominal pain and vomiting.  Genitourinary: Negative for dysuria and hematuria.  Musculoskeletal: Positive for joint swelling. Negative for arthralgias and back pain.  Skin: Negative for color change and rash.  Allergic/Immunologic: Positive for immunocompromised state.  Neurological: Positive for dizziness, weakness and headaches. Negative for seizures and syncope.  Psychiatric/Behavioral: Negative for confusion.  All other systems reviewed and are negative.   Physical Exam Updated Vital Signs BP (!) 129/101 (BP Location: Left Leg)    Pulse 85    Temp 97.6 F (36.4 C) (Oral)    Resp 15    LMP  (LMP Unknown)    SpO2 100%   Physical Exam Vitals and nursing note reviewed.   Constitutional:      General: She is not in acute distress.    Appearance: She is well-developed. She is obese. She is ill-appearing (Chronically ill appearing ).  HENT:     Head: Normocephalic and atraumatic.     Nose: Nose normal.     Mouth/Throat:     Mouth: Mucous membranes are moist.     Pharynx: Oropharynx is clear.  Eyes:     Extraocular Movements: Extraocular movements intact.     Conjunctiva/sclera: Conjunctivae normal.     Pupils: Pupils are equal, round, and reactive to light.  Cardiovascular:     Rate and Rhythm: Normal rate and regular rhythm.     Heart sounds: No murmur.  Pulmonary:     Effort: No respiratory distress.     Breath sounds: Rales present.  Chest:     Chest wall: No tenderness.  Abdominal:     General: Abdomen is flat.     Palpations: Abdomen is soft.     Tenderness: There is no abdominal tenderness. There is no right CVA tenderness.  Musculoskeletal:        General: Swelling and tenderness present.     Cervical back: Normal range of motion and neck supple.     Right lower leg: Edema present.     Left lower leg: Edema present.     Comments: 2+ nonpitting edema in hands and feet bilaterally with mild purple discoloration. Pt does have left index finger amputated. 5/5 strength and sensations intact in UE and LE. No C, T, L spine midline tenderness   Skin:    General: Skin is warm and dry.  Neurological:     General: No focal deficit present.     Mental Status: She is alert and oriented to person, place, and time.     Cranial Nerves: No cranial nerve deficit.  Motor: No weakness.     Comments: Mental Status:  Alert, thought content appropriate, able to give a coherent history. Speech fluent without evidence of aphasia. Able to follow 2 step commands without difficulty.  Cranial Nerves:  II: Peripheral visual fields grossly normal, pupils equal, round, reactive to light III,IV, VI: ptosis not present, extra-ocular motions intact bilaterally   V,VII: smile symmetric, facial light touch sensation equal VIII: hearing grossly normal to voice  X: uvula elevates symmetrically  XI: bilateral shoulder shrug symmetric and strong XII: midline tongue extension without fassiculations Motor:  Normal tone. 5/5 strength of BUE and BLE major muscle groups including strong and equal grip strength and dorsiflexion/plantar flexion Sensory: light touch normal in all extremities. Cerebellar: normal finger-to-nose with bilateral upper extremities, Romberg sign absent Gait: not accessed   Psychiatric:        Mood and Affect: Mood normal.        Behavior: Behavior normal.     ED Results / Procedures / Treatments   Labs (all labs ordered are listed, but only abnormal results are displayed) Labs Reviewed  TSH - Abnormal; Notable for the following components:      Result Value   TSH 0.060 (*)    All other components within normal limits  T4, FREE  CORTISOL  T3, FREE  HIV ANTIBODY (ROUTINE TESTING W REFLEX)  CBC  CREATININE, SERUM  BRAIN NATRIURETIC PEPTIDE  COMPREHENSIVE METABOLIC PANEL  CBC    EKG None  Radiology CT Head Wo Contrast  Result Date: 02/10/2020 CLINICAL DATA:  Headache, history of multiple cancers EXAM: CT HEAD WITHOUT CONTRAST TECHNIQUE: Contiguous axial images were obtained from the base of the skull through the vertex without intravenous contrast. COMPARISON:  2013 FINDINGS: Brain: There is no acute intracranial hemorrhage, mass effect, or edema. Gray-white differentiation is preserved. There is no extra-axial fluid collection. Ventricles and sulci are within normal limits in size and configuration. Vascular: No hyperdense vessel or unexpected calcification. Skull: Calvarium is unremarkable. Sinuses/Orbits: No acute finding. Other: None. IMPRESSION: No acute intracranial abnormality. Electronically Signed   By: Macy Mis M.D.   On: 02/10/2020 13:49   CT Angio Chest PE W and/or Wo Contrast  Result Date:  02/10/2020 CLINICAL DATA:  Shortness of breath, hemoptysis, cough. History of multiple cancers EXAM: CT ANGIOGRAPHY CHEST WITH CONTRAST TECHNIQUE: Multidetector CT imaging of the chest was performed using the standard protocol during bolus administration of intravenous contrast. Multiplanar CT image reconstructions and MIPs were obtained to evaluate the vascular anatomy. CONTRAST:  164m OMNIPAQUE IOHEXOL 350 MG/ML SOLN COMPARISON:  12/29/2019 FINDINGS: Cardiovascular: Satisfactory opacification of the pulmonary arteries to the segmental level. No evidence of pulmonary embolism. Normal heart size. No pericardial effusion. Thoracic aorta is nonaneurysmal. Minimal atherosclerotic calcification of the aortic arch. Right-sided chest port is seen with distal tip terminating at the superior cavoatrial junction. Mediastinum/Nodes: Prior right axillary lymph node dissection. No axillary lymphadenopathy. No enlarged mediastinal or hilar lymph nodes. Status post thyroidectomy. Trachea and esophagus are within normal limits. Lungs/Pleura: Stable subpleural fat containing nodule along the posterior right hemidiaphragm (series 6, image 97) is unchanged from 2016, benign. No new pulmonary nodule. There is mosaic attenuation of the bilateral lung fields. No focal lobar consolidation. No pleural effusion or pneumothorax. Upper Abdomen: No acute abnormality. Musculoskeletal: Bilateral breast prostheses. No acute osseous abnormality. No suspicious lytic or sclerotic bone lesion. Review of the MIP images confirms the above findings. IMPRESSION: 1. No evidence of pulmonary embolism. 2. Mosaic attenuation of the  bilateral lung fields, which can be seen with small airways disease/bronchiolitis. A Electronically Signed   By: Davina Poke D.O.   On: 02/10/2020 14:59   DG Chest Portable 1 View  Result Date: 02/10/2020 CLINICAL DATA:  Shortness of breath. EXAM: PORTABLE CHEST 1 VIEW COMPARISON:  July 09, 2019. FINDINGS: The heart  size and mediastinal contours are within normal limits. Stable right internal jugular Port-A-Cath is noted. Minimal bibasilar subsegmental atelectasis is noted. No pneumothorax or pleural effusion is noted. The visualized skeletal structures are unremarkable. IMPRESSION: Minimal bibasilar subsegmental atelectasis. Electronically Signed   By: Marijo Conception M.D.   On: 02/10/2020 13:08   MR MRV HEAD W WO CONTRAST  Result Date: 02/10/2020 CLINICAL DATA:  History of breast cancer and lymphoma. History of thyroid cancer and cervical cancer. Headache. Assess for intracranial venous thrombosis. EXAM: MR VENOGRAM HEAD WITHOUT AND WITH CONTRAST TECHNIQUE: Angiographic images of the intracranial venous structures were obtained using MRV technique without and with intravenous contrast. CONTRAST:  50m GADAVIST GADOBUTROL 1 MMOL/ML IV SOLN COMPARISON:  Head CT same day FINDINGS: The study is normal. Superior sagittal sinus is patent and normal. Transverse sinuses are normal. Sigmoid sinuses are normal. Jugular veins both show flow. Deep veins are patent. No evidence of superficial venous thrombosis. No abnormal brain enhancement seen on limited parenchymal imaging. IMPRESSION: Normal study. No cause of headache identified. No evidence of intracranial venous thrombosis. Electronically Signed   By: MNelson ChimesM.D.   On: 02/10/2020 16:16    Procedures Procedures (including critical care time)  Medications Ordered in ED Medications  sodium chloride (PF) 0.9 % injection (has no administration in time range)  potassium chloride SA (KLOR-CON) CR tablet 20 mEq (has no administration in time range)  diphenhydrAMINE (BENADRYL) injection 50 mg (has no administration in time range)  colchicine tablet 0.6 mg (has no administration in time range)  clonazePAM (KLONOPIN) tablet 1 mg (has no administration in time range)  benzonatate (TESSALON) capsule 200 mg (has no administration in time range)  beclomethasone (QVAR) 80  MCG/ACT inhaler 1 puff (has no administration in time range)  aspirin tablet 325 mg (has no administration in time range)  gi cocktail (Maalox,Lidocaine,Donnatal) (has no administration in time range)  escitalopram (LEXAPRO) tablet 20 mg (has no administration in time range)  estradiol (ESTRACE) tablet 2 mg (has no administration in time range)  fluticasone furoate-vilanterol (BREO ELLIPTA) 200-25 MCG/INH 1 puff (has no administration in time range)  hydrocortisone (CORTEF) tablet 10-20 mg (has no administration in time range)  levothyroxine (SYNTHROID) tablet 112 mcg (has no administration in time range)  LORazepam (ATIVAN) tablet 0.5-1 mg (has no administration in time range)  metoprolol succinate (TOPROL-XL) 24 hr tablet 37.5 mg (has no administration in time range)  oxyCODONE-acetaminophen (PERCOCET/ROXICET) 5-325 MG per tablet 1 tablet (has no administration in time range)  Rimegepant Sulfate TBDP 75 mg (has no administration in time range)  rosuvastatin (CRESTOR) tablet 10 mg (has no administration in time range)  sucralfate (CARAFATE) tablet 1 g (has no administration in time range)  topiramate (TOPAMAX) tablet 150 mg (has no administration in time range)  ketorolac (TORADOL) 30 MG/ML injection 30 mg (has no administration in time range)  0.9 %  sodium chloride infusion (has no administration in time range)  furosemide (LASIX) injection 20 mg (has no administration in time range)  doxycycline (VIBRA-TABS) tablet 100 mg (has no administration in time range)  enoxaparin (LOVENOX) injection 40 mg (has no administration in time range)  acetaminophen (TYLENOL) tablet 650 mg (has no administration in time range)    Or  acetaminophen (TYLENOL) suppository 650 mg (has no administration in time range)  albuterol (PROVENTIL) (2.5 MG/3ML) 0.083% nebulizer solution 2.5 mg (has no administration in time range)  diphenhydrAMINE (BENADRYL) injection 25 mg (has no administration in time range)   fentaNYL (SUBLIMAZE) injection 25 mcg (25 mcg Intravenous Given 02/10/20 1312)  diphenhydrAMINE (BENADRYL) injection 12.5 mg (12.5 mg Intravenous Given 02/10/20 1335)  fentaNYL (SUBLIMAZE) injection 25 mcg (25 mcg Intravenous Given 02/10/20 1423)  sodium chloride 0.9 % bolus 1,000 mL (1,000 mLs Intravenous New Bag/Given 02/10/20 1426)  iohexol (OMNIPAQUE) 350 MG/ML injection 100 mL (100 mLs Intravenous Contrast Given 02/10/20 1441)  LORazepam (ATIVAN) injection 1 mg (1 mg Intravenous Given 02/10/20 1532)  gadobutrol (GADAVIST) 1 MMOL/ML injection 10 mL (10 mLs Intravenous Contrast Given 02/10/20 1608)    ED Course  I have reviewed the triage vital signs and the nursing notes.  Pertinent labs & imaging results that were available during my care of the patient were reviewed by me and considered in my medical decision making (see chart for details).    MDM Rules/Calculators/A&P                     49 year old female with shortness of breath, swelling, migraine unrelieved by typical medications, malaise and overall feeling unwell x5 days On presentation, the patient is a chronically ill-appearing woman resting comfortably in the bed.  She is on 3 L of O2 [place] in the room, with no evidence of increased work of breathing, tachypnea.  She is mildly hypertensive on presentation, but afebrile, mildly tachypneic but normal pulse.  Physical exam positive for 2+ nonpitting edema in her hands and lower extremities.  She does have some rales on exam, but abdomen is soft and nontender.  Labs done by oncology earlier today overall reassuring, CBC without leukocytosis, normal hemoglobin.  CMP with mild hyponatremia and mild hypocalcemia.  Chest x-ray with mild bibasilar atelectasis.  CT without acute intracranial abnormality.  TSH today 0.060 which appears to be slightly improved from her last lab values 1 year ago of 0.0161. Patient is concerned over her free T3, T4 and cortisol levels as she does have a history of  addisonian crisis.  We will add on to lab work, though this will likely not resolve throughout her ED course.  On reevaluation, patient reports her headache did not really improve with fentanyl and she is requesting more.  We will give 1 L fluid bolus, discussed the case with Dr. Alvino Chapel and we will order a CT PE study and MR of the head to rule out mets to the brain.    3:14 PM: Dr. Alvino Chapel spoke with the patient and was told that PA Lucianne Lei dropped the patient off at the ER for fluid replacement and symptom management/possible MRI. I attempted to contact [employer] [facility] without succeess.  Will consult on call oncologist for further guidance.   3:41 PM: I personally spoke with Tanner PA-C and Dr. 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  Concern was for possible PE, mets to the brain, new onset heart failure. Dr. Lindi Adie admits that they are not quite sure what is causing the patient's symptoms but they recommend admission for diuresis, further work-up. CT without  evidence of PE,  MRI of head negative for acute abnormalities.  4:10 PM: Dr. Alvino Chapel spoke with the hospitalist, they will see the patient and admit for further management.  Patient is agreeable to this plan and is currently stable in the ER.   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

## 2020-02-10 NOTE — Progress Notes (Signed)
Pt transported to [facility] via w/c with belongings and home oxygen tank by PA Lucianne Lei w/NT Maudie Mercury assistance.  Report to be given bedside by PA Lucianne Lei to receiving RN.  Pt to keep O2 sat meter on hand for duration of transport d/t receiving IV dilaudid right before unit transfer, PA Van to monitor during transport.

## 2020-02-10 NOTE — ED Notes (Signed)
Per Lucianne Lei, states patient with severe headache

## 2020-02-10 NOTE — ED Notes (Signed)
Pt transported to CT

## 2020-02-10 NOTE — Progress Notes (Signed)
Patient's port does not have antimicrobial disc in place. Patient refused dressing change.

## 2020-02-11 ENCOUNTER — Inpatient Hospital Stay (HOSPITAL_COMMUNITY): Payer: BC Managed Care – PPO

## 2020-02-11 DIAGNOSIS — I350 Nonrheumatic aortic (valve) stenosis: Secondary | ICD-10-CM | POA: Diagnosis present

## 2020-02-11 DIAGNOSIS — J219 Acute bronchiolitis, unspecified: Secondary | ICD-10-CM

## 2020-02-11 DIAGNOSIS — R600 Localized edema: Secondary | ICD-10-CM | POA: Diagnosis not present

## 2020-02-11 DIAGNOSIS — Z20822 Contact with and (suspected) exposure to covid-19: Secondary | ICD-10-CM | POA: Diagnosis present

## 2020-02-11 DIAGNOSIS — R609 Edema, unspecified: Secondary | ICD-10-CM | POA: Diagnosis not present

## 2020-02-11 DIAGNOSIS — R0602 Shortness of breath: Secondary | ICD-10-CM

## 2020-02-11 DIAGNOSIS — M810 Age-related osteoporosis without current pathological fracture: Secondary | ICD-10-CM | POA: Diagnosis present

## 2020-02-11 DIAGNOSIS — Z85850 Personal history of malignant neoplasm of thyroid: Secondary | ICD-10-CM | POA: Diagnosis not present

## 2020-02-11 DIAGNOSIS — R519 Headache, unspecified: Secondary | ICD-10-CM | POA: Diagnosis not present

## 2020-02-11 DIAGNOSIS — Z8541 Personal history of malignant neoplasm of cervix uteri: Secondary | ICD-10-CM | POA: Diagnosis not present

## 2020-02-11 DIAGNOSIS — T8549XA Other mechanical complication of breast prosthesis and implant, initial encounter: Secondary | ICD-10-CM | POA: Diagnosis present

## 2020-02-11 DIAGNOSIS — R601 Generalized edema: Secondary | ICD-10-CM | POA: Diagnosis not present

## 2020-02-11 DIAGNOSIS — I7300 Raynaud's syndrome without gangrene: Secondary | ICD-10-CM | POA: Diagnosis present

## 2020-02-11 DIAGNOSIS — R7303 Prediabetes: Secondary | ICD-10-CM | POA: Diagnosis present

## 2020-02-11 DIAGNOSIS — G43509 Persistent migraine aura without cerebral infarction, not intractable, without status migrainosus: Principal | ICD-10-CM

## 2020-02-11 DIAGNOSIS — I110 Hypertensive heart disease with heart failure: Secondary | ICD-10-CM | POA: Diagnosis present

## 2020-02-11 DIAGNOSIS — J9611 Chronic respiratory failure with hypoxia: Secondary | ICD-10-CM | POA: Diagnosis present

## 2020-02-11 DIAGNOSIS — E890 Postprocedural hypothyroidism: Secondary | ICD-10-CM | POA: Diagnosis present

## 2020-02-11 DIAGNOSIS — R338 Other retention of urine: Secondary | ICD-10-CM

## 2020-02-11 DIAGNOSIS — M7989 Other specified soft tissue disorders: Secondary | ICD-10-CM

## 2020-02-11 DIAGNOSIS — R531 Weakness: Secondary | ICD-10-CM | POA: Diagnosis not present

## 2020-02-11 DIAGNOSIS — E876 Hypokalemia: Secondary | ICD-10-CM | POA: Diagnosis present

## 2020-02-11 DIAGNOSIS — F329 Major depressive disorder, single episode, unspecified: Secondary | ICD-10-CM | POA: Diagnosis present

## 2020-02-11 DIAGNOSIS — Z9981 Dependence on supplemental oxygen: Secondary | ICD-10-CM | POA: Diagnosis not present

## 2020-02-11 DIAGNOSIS — J9621 Acute and chronic respiratory failure with hypoxia: Secondary | ICD-10-CM | POA: Diagnosis not present

## 2020-02-11 DIAGNOSIS — I890 Lymphedema, not elsewhere classified: Secondary | ICD-10-CM | POA: Diagnosis present

## 2020-02-11 DIAGNOSIS — I428 Other cardiomyopathies: Secondary | ICD-10-CM | POA: Diagnosis present

## 2020-02-11 DIAGNOSIS — I340 Nonrheumatic mitral (valve) insufficiency: Secondary | ICD-10-CM | POA: Diagnosis not present

## 2020-02-11 DIAGNOSIS — R05 Cough: Secondary | ICD-10-CM | POA: Diagnosis not present

## 2020-02-11 DIAGNOSIS — R110 Nausea: Secondary | ICD-10-CM

## 2020-02-11 DIAGNOSIS — I351 Nonrheumatic aortic (valve) insufficiency: Secondary | ICD-10-CM | POA: Diagnosis not present

## 2020-02-11 DIAGNOSIS — Z7982 Long term (current) use of aspirin: Secondary | ICD-10-CM | POA: Diagnosis not present

## 2020-02-11 DIAGNOSIS — K219 Gastro-esophageal reflux disease without esophagitis: Secondary | ICD-10-CM | POA: Diagnosis present

## 2020-02-11 DIAGNOSIS — F419 Anxiety disorder, unspecified: Secondary | ICD-10-CM | POA: Diagnosis present

## 2020-02-11 DIAGNOSIS — C8590 Non-Hodgkin lymphoma, unspecified, unspecified site: Secondary | ICD-10-CM | POA: Diagnosis present

## 2020-02-11 DIAGNOSIS — E271 Primary adrenocortical insufficiency: Secondary | ICD-10-CM | POA: Diagnosis present

## 2020-02-11 DIAGNOSIS — I50810 Right heart failure, unspecified: Secondary | ICD-10-CM | POA: Diagnosis present

## 2020-02-11 LAB — CBC
HCT: 35.6 % — ABNORMAL LOW (ref 36.0–46.0)
Hemoglobin: 11.2 g/dL — ABNORMAL LOW (ref 12.0–15.0)
MCH: 29.2 pg (ref 26.0–34.0)
MCHC: 31.5 g/dL (ref 30.0–36.0)
MCV: 93 fL (ref 80.0–100.0)
Platelets: 269 10*3/uL (ref 150–400)
RBC: 3.83 MIL/uL — ABNORMAL LOW (ref 3.87–5.11)
RDW: 14.1 % (ref 11.5–15.5)
WBC: 5.7 10*3/uL (ref 4.0–10.5)
nRBC: 0 % (ref 0.0–0.2)

## 2020-02-11 LAB — COMPREHENSIVE METABOLIC PANEL
ALT: 36 U/L (ref 0–44)
AST: 35 U/L (ref 15–41)
Albumin: 3.5 g/dL (ref 3.5–5.0)
Alkaline Phosphatase: 47 U/L (ref 38–126)
Anion gap: 11 (ref 5–15)
BUN: 16 mg/dL (ref 6–20)
CO2: 26 mmol/L (ref 22–32)
Calcium: 8.5 mg/dL — ABNORMAL LOW (ref 8.9–10.3)
Chloride: 104 mmol/L (ref 98–111)
Creatinine, Ser: 1.03 mg/dL — ABNORMAL HIGH (ref 0.44–1.00)
GFR calc Af Amer: >60 mL/min (ref >60)
GFR calc non Af Amer: >60 mL/min (ref >60)
Glucose, Bld: 103 mg/dL — ABNORMAL HIGH (ref 70–99)
Potassium: 3.9 mmol/L (ref 3.5–5.1)
Sodium: 141 mmol/L (ref 135–145)
Total Bilirubin: 0.6 mg/dL (ref 0.3–1.2)
Total Protein: 6.5 g/dL (ref 6.5–8.1)

## 2020-02-11 LAB — BRAIN NATRIURETIC PEPTIDE: B Natriuretic Peptide: 90.6 pg/mL (ref 0.0–100.0)

## 2020-02-11 LAB — HIV ANTIBODY (ROUTINE TESTING W REFLEX): HIV Screen 4th Generation wRfx: NONREACTIVE

## 2020-02-11 MED ORDER — HYDROCORTISONE NA SUCCINATE PF 100 MG IJ SOLR
50.0000 mg | Freq: Three times a day (TID) | INTRAMUSCULAR | Status: DC
  Administered 2020-02-11 – 2020-02-14 (×9): 50 mg via INTRAVENOUS
  Filled 2020-02-11 (×9): qty 2

## 2020-02-11 MED ORDER — CHLORHEXIDINE GLUCONATE CLOTH 2 % EX PADS
6.0000 | MEDICATED_PAD | Freq: Every day | CUTANEOUS | Status: DC
  Administered 2020-02-12 – 2020-02-14 (×3): 6 via TOPICAL

## 2020-02-11 MED ORDER — LORAZEPAM 2 MG/ML IJ SOLN
1.0000 mg | Freq: Once | INTRAMUSCULAR | Status: AC
  Administered 2020-02-11: 1 mg via INTRAVENOUS
  Filled 2020-02-11: qty 1

## 2020-02-11 MED ORDER — SODIUM CHLORIDE 0.9 % IV SOLN
500.0000 mg | INTRAVENOUS | Status: AC
  Administered 2020-02-11 – 2020-02-13 (×3): 500 mg via INTRAVENOUS
  Filled 2020-02-11 (×3): qty 500

## 2020-02-11 MED ORDER — DIPHENHYDRAMINE HCL 50 MG/ML IJ SOLN
50.0000 mg | Freq: Four times a day (QID) | INTRAMUSCULAR | Status: DC | PRN
  Administered 2020-02-11 – 2020-02-13 (×8): 50 mg via INTRAVENOUS
  Filled 2020-02-11 (×8): qty 1

## 2020-02-11 MED ORDER — DRONABINOL 2.5 MG PO CAPS
2.5000 mg | ORAL_CAPSULE | Freq: Two times a day (BID) | ORAL | Status: DC
  Administered 2020-02-11 – 2020-02-14 (×7): 2.5 mg via ORAL
  Filled 2020-02-11 (×7): qty 1

## 2020-02-11 MED ORDER — SODIUM CHLORIDE 0.9 % IV SOLN
1.0000 g | INTRAVENOUS | Status: DC
  Administered 2020-02-11 – 2020-02-14 (×4): 1 g via INTRAVENOUS
  Filled 2020-02-11 (×4): qty 1

## 2020-02-11 NOTE — Progress Notes (Signed)
[name] Kitchen  PROGRESS NOTE    [name]  XIP:[id number] DOB: 1971/02/11 DOA: 02/10/2020 PCP: Su Grand, MD   Brief Narrative:   [name] is a 49 y.o. female with medical history significant for Chronic lymphedemea, migraine/headache on nurtec/topamax/Galcanezumab q 4wk injection, lymphedema, history of thyroid cancer, chest lymphoma, breast ductal carcinoma, Addison's disease, cervical cancer, superficial venous thrombosis in lower extremity and upper extremities previously on Lovenox now on aspirin 325 mg coming to the oncology clinic with multiple symptoms complaints. She reports she has "Rt sided chf-Adriamycin induced, chronic resp failure w/ hypoxia from bleomycin on lasix and 3-4 l [place] O2".  She C/o headache for x few days on left parietal side, c/o dry mouth but swollen arms. She also complains of trouble breathing, blurred vision, headache fatigue, arm swelling. She was dizzy at [facility] few days ago, and  has generalized weakness and reports she almost passed out twice in last 5 days. She Called neuro office at [facility] for headache and reports they are planning for pain management- botox inj in 2 wks. She was seen in Oncology clinic and sent to ED for iv hydration but looked wet and dry- mouth and was told she is "third spacing" but " intravascularly dry" and was given NS bolus for hydration. She underwent extensive work up In ED, VS STABLE, cbc. CMP Normal, CT head normal, CXR "okay", CTA- bronchiolitis features.MR MRV brain - normal no intracranial venous thrombosis. TSH low  0.06 but improving from before 0.016 yr ago. FT4/FT3 sent, Cortisol ordered. She also received fentanyl 25 mcg x2, benadryl 12.5 mg iv, ativan 1 mg iv. ED  discussed with oncology and advised admission for " fine tuning" She also c/o cough with brownish sputum.  She otherwise denies any fever, chills, chest pain, shortness of breath, abdominal pain, focal weakness.  She has chronic nausea takes IV Benadryl injection at  home, reports she is allergic to most of the nausea medication.  She wants to try Toradol for the headache.  5/13: C/o continued cough today and edema. She is tight and course on exam. Check sputum Cx, change abx to rocephin/zithro, increase steroids. Spoke with onco. Will get contrasted MRI of sternum. She reports low UOP. Get bladder scan and follows I&Os.    Assessment & Plan:   Principal Problem:   Headache Active Problems:   Generalized weakness  Left-sided headache w/ history of migraine     - on nurtec/topamax/Galcanezumab q 4wk injection at home.     - Home medications resumed, added IV Toradol as needed     - She has a follow-up with [facility] neurology/pain clinic for Botox trial for her headache.  CT head MRV reviewed no acute finding.     - Nonfocal on exam.     - 5/13: she is not focused on HA this AM, continue to monitor  Generalized weakness/dizziness     - orthostatic blood pressures pending     - 5/13: PT eval: HHPT     - random cortisol is 5.9; check AM cortisol  Nausea     - continue benadryl, ativan     - 5/13: will see if marinol will help  Upper extremity lymphedema     - worsening edema history of right-sided heart failure     - CXR and CTA w/ no features of CHF or PE.      - placed on lasix 25m IV     - 5/13: she reports poor UOP --  nothing is documented     - SCr is stable. BNP is 90.  Urinary retention?     - let's get I&Os, check bladder scan  Cough Sputum production     - CTA shows bronchiolitis     - 5/13: has chronically been on doxy; will switch her to rocephin/zithro     - continue qvar inhaler, albuterol inhaler, and nebs     - sputum Cx ordered     - she sounds tight and course on exam; increase steroids  Hypokalemia     - resolved  Chronic hypoxic respiratory failure Chronic right-sided CHF     - Continue home oxygen 3 to 4 L nasal cannula.     - 5/13: BNP is 90; CTA w/o cardiomegaly or pulm edema     - now on lasix 20 mg IV  BID     - on 3L [place] during exam  Hx of thyroid cancer     - TSH low, patient reports chronically low.     - 5/13: T4 is ok; T3 is pending. Continue synthroid     - follow up with endocrine   Chest lymphoma     - Patient is scheduled to get MRI sternum by Dr. Lindi Adie.     - 5/13: spoke with dr. Lindi Adie this afternoon. Pt has a history of multiple CA and still not sure what is her root problem. They have requested that we get the MRI of the sternum as a contrasted study while she is here. We will order this exam. Appreciate their assistance. They will be following during her stay.    Addison's disease     - No features suggestive of Addison's crisis     - orthostatic blood pressure pending      - 5/13: random cortisol was 5.9; check an AM cortisol     - she is on her home dose of cortef; may increase that to stress dosing  Hx of venous thrombosis in lower extremity and upper extremities     - previously on Lovenox now on aspirin 325 mg; continue ASA  Anxiety Depression     - continue her Klonopin, Lexapro  DVT prophylaxis: SCDs Code Status: FULL Family Communication: Spoke with husband at bedside.   Status is: Observation  The patient will require care spanning > 2 midnights and should be moved to inpatient because: Inpatient level of care appropriate due to severity of illness  Dispo: The patient is from: Home              Anticipated d/c is to: Home              Anticipated d/c date is: 2 days              Patient currently is not medically stable to d/c.  Consultants:   Heme/onc  Procedures:   None  Antimicrobials:  . Rocephin/Azithromycin   ROS:  Reports N, swelling, cough, dyspnea . Remainder 10-pt ROS is negative for all not previously mentioned.  Subjective: "I've been bring up brown stuff and haven't peed."  Objective: Vitals:   02/11/20 0225 02/11/20 0553 02/11/20 0800 02/11/20 1000  BP: (!) 99/56 (!) 142/89 118/67   Pulse: 82 (!) 106 84   Resp: 18  18    Temp: (!) 97.5 F (36.4 C) 97.8 F (36.6 C)    TempSrc: Oral Oral    SpO2: 99% 96%  99%  Weight:      Height:  No intake or output data in the 24 hours ending 02/11/20 1313 Filed Weights   02/10/20 2315  Weight: 87.2 kg    Examination:  General: 49 y.o. female resting in bed in NAD Cardiovascular: RRR, +S1, S2, no m/g/r, equal pulses throughout Respiratory: course throughout; tight at bases GI: BS+, NDNT, no masses noted, no organomegaly noted MSK: No c/c; BUE 2+ edema Neuro: A&O x 3, no focal deficits Psyc: Appropriate interaction and affect, calm/cooperative   Data Reviewed: I have personally reviewed following labs and imaging studies.  CBC: Recent Labs  Lab 02/10/20 1020 02/11/20 0530  WBC 7.1 5.7  NEUTROABS 3.7  --   HGB 12.7 11.2*  HCT 39.3 35.6*  MCV 91.4 93.0  PLT 297 625   Basic Metabolic Panel: Recent Labs  Lab 02/10/20 1020 02/11/20 0530  NA 141 141  K 3.4* 3.9  CL 106 104  CO2 24 26  GLUCOSE 98 103*  BUN 15 16  CREATININE 0.99 1.03*  CALCIUM 8.4* 8.5*   GFR: Estimated Creatinine Clearance: 72.9 mL/min (A) (by C-G formula based on SCr of 1.03 mg/dL (H)). Liver Function Tests: Recent Labs  Lab 02/10/20 1020 02/11/20 0530  AST 36 35  ALT 43 36  ALKPHOS 59 47  BILITOT 0.4 0.6  PROT 7.3 6.5  ALBUMIN 3.7 3.5   No results for input(s): LIPASE, AMYLASE in the last 168 hours. No results for input(s): AMMONIA in the last 168 hours. Coagulation Profile: No results for input(s): INR, PROTIME in the last 168 hours. Cardiac Enzymes: No results for input(s): CKTOTAL, CKMB, CKMBINDEX, TROPONINI in the last 168 hours. BNP (last 3 results) No results for input(s): PROBNP in the last 8760 hours. HbA1C: No results for input(s): HGBA1C in the last 72 hours. CBG: No results for input(s): GLUCAP in the last 168 hours. Lipid Profile: No results for input(s): CHOL, HDL, LDLCALC, TRIG, CHOLHDL, LDLDIRECT in the last 72 hours. Thyroid  Function Tests: Recent Labs    02/10/20 1241  TSH 0.060*  FREET4 1.07   Anemia Panel: No results for input(s): VITAMINB12, FOLATE, FERRITIN, TIBC, IRON, RETICCTPCT in the last 72 hours. Sepsis Labs: No results for input(s): PROCALCITON, LATICACIDVEN in the last 168 hours.  Recent Results (from the past 240 hour(s))  SARS Coronavirus 2 by RT PCR (hospital order, performed in [facility] hospital lab) Nasopharyngeal Nasopharyngeal Swab     Status: None   Collection Time: 02/10/20  8:47 PM   Specimen: Nasopharyngeal Swab  Result Value Ref Range Status   SARS Coronavirus 2 NEGATIVE NEGATIVE Final    Comment: (NOTE) SARS-CoV-2 target nucleic acids are NOT DETECTED. The SARS-CoV-2 RNA is generally detectable in upper and lower respiratory specimens during the acute phase of infection. The lowest concentration of SARS-CoV-2 viral copies this assay can detect is 250 copies / mL. A negative result does not preclude SARS-CoV-2 infection and should not be used as the sole basis for treatment or other patient management decisions.  A negative result may occur with improper specimen collection / handling, submission of specimen other than nasopharyngeal swab, presence of viral mutation(s) within the areas targeted by this assay, and inadequate number of viral copies (<250 copies / mL). A negative result must be combined with clinical observations, patient history, and epidemiological information. Fact Sheet for Patients:   StrictlyIdeas.no Fact Sheet for Healthcare Providers: BankingDealers.co.za This test is not yet approved or cleared  by the [place] FDA and has been authorized for detection and/or diagnosis of SARS-CoV-2  by FDA under an Emergency Use Authorization (EUA).  This EUA will remain in effect (meaning this test can be used) for the duration of the COVID-19 declaration under Section 564(b)(1) of the Act, 21 U.S.C. section  360bbb-3(b)(1), unless the authorization is terminated or revoked sooner. Performed at [facility], [place] [address] [place], [place] [address]       Radiology Studies: CT Head Wo Contrast  Result Date: 02/10/2020 CLINICAL DATA:  Headache, history of multiple cancers EXAM: CT HEAD WITHOUT CONTRAST TECHNIQUE: Contiguous axial images were obtained from the base of the skull through the vertex without intravenous contrast. COMPARISON:  2013 FINDINGS: Brain: There is no acute intracranial hemorrhage, mass effect, or edema. Gray-white differentiation is preserved. There is no extra-axial fluid collection. Ventricles and sulci are within normal limits in size and configuration. Vascular: No hyperdense vessel or unexpected calcification. Skull: Calvarium is unremarkable. Sinuses/Orbits: No acute finding. Other: None. IMPRESSION: No acute intracranial abnormality. Electronically Signed   By: Macy Mis M.D.   On: 02/10/2020 13:49   CT Angio Chest PE W and/or Wo Contrast  Result Date: 02/10/2020 CLINICAL DATA:  Shortness of breath, hemoptysis, cough. History of multiple cancers EXAM: CT ANGIOGRAPHY CHEST WITH CONTRAST TECHNIQUE: Multidetector CT imaging of the chest was performed using the standard protocol during bolus administration of intravenous contrast. Multiplanar CT image reconstructions and MIPs were obtained to evaluate the vascular anatomy. CONTRAST:  116m OMNIPAQUE IOHEXOL 350 MG/ML SOLN COMPARISON:  12/29/2019 FINDINGS: Cardiovascular: Satisfactory opacification of the pulmonary arteries to the segmental level. No evidence of pulmonary embolism. Normal heart size. No pericardial effusion. Thoracic aorta is nonaneurysmal. Minimal atherosclerotic calcification of the aortic arch. Right-sided chest port is seen with distal tip terminating at the superior cavoatrial junction. Mediastinum/Nodes: Prior right axillary lymph node dissection. No axillary lymphadenopathy. No enlarged  mediastinal or hilar lymph nodes. Status post thyroidectomy. Trachea and esophagus are within normal limits. Lungs/Pleura: Stable subpleural fat containing nodule along the posterior right hemidiaphragm (series 6, image 97) is unchanged from 2016, benign. No new pulmonary nodule. There is mosaic attenuation of the bilateral lung fields. No focal lobar consolidation. No pleural effusion or pneumothorax. Upper Abdomen: No acute abnormality. Musculoskeletal: Bilateral breast prostheses. No acute osseous abnormality. No suspicious lytic or sclerotic bone lesion. Review of the MIP images confirms the above findings. IMPRESSION: 1. No evidence of pulmonary embolism. 2. Mosaic attenuation of the bilateral lung fields, which can be seen with small airways disease/bronchiolitis. A Electronically Signed   By: NDavina PokeD.O.   On: 02/10/2020 14:59   DG Chest Portable 1 View  Result Date: 02/10/2020 CLINICAL DATA:  Shortness of breath. EXAM: PORTABLE CHEST 1 VIEW COMPARISON:  July 09, 2019. FINDINGS: The heart size and mediastinal contours are within normal limits. Stable right internal jugular Port-A-Cath is noted. Minimal bibasilar subsegmental atelectasis is noted. No pneumothorax or pleural effusion is noted. The visualized skeletal structures are unremarkable. IMPRESSION: Minimal bibasilar subsegmental atelectasis. Electronically Signed   By: JMarijo ConceptionM.D.   On: 02/10/2020 13:08   MR MRV HEAD W WO CONTRAST  Result Date: 02/10/2020 CLINICAL DATA:  History of breast cancer and lymphoma. History of thyroid cancer and cervical cancer. Headache. Assess for intracranial venous thrombosis. EXAM: MR VENOGRAM HEAD WITHOUT AND WITH CONTRAST TECHNIQUE: Angiographic images of the intracranial venous structures were obtained using MRV technique without and with intravenous contrast. CONTRAST:  1110mGADAVIST GADOBUTROL 1 MMOL/ML IV SOLN COMPARISON:  Head CT same day FINDINGS: The  study is normal. Superior  sagittal sinus is patent and normal. Transverse sinuses are normal. Sigmoid sinuses are normal. Jugular veins both show flow. Deep veins are patent. No evidence of superficial venous thrombosis. No abnormal brain enhancement seen on limited parenchymal imaging. IMPRESSION: Normal study. No cause of headache identified. No evidence of intracranial venous thrombosis. Electronically Signed   By: Nelson Chimes M.D.   On: 02/10/2020 16:16     Scheduled Meds: . aspirin  325 mg Oral QHS  . budesonide (PULMICORT) nebulizer solution  0.25 mg Nebulization BID  . clonazePAM  1 mg Oral BID  . colchicine  0.6 mg Oral Q1500  . dronabinol  2.5 mg Oral BID AC  . enoxaparin (LOVENOX) injection  40 mg Subcutaneous Q24H  . escitalopram  20 mg Oral QPM  . estradiol  2 mg Oral QPM  . fluticasone furoate-vilanterol  1 puff Inhalation Daily  . furosemide  20 mg Intravenous BID  . hydrocortisone  20 mg Oral Daily   And  . hydrocortisone  10 mg Oral QHS  . levothyroxine  112 mcg Oral Q0600  . metoprolol succinate  37.5 mg Oral Daily  . potassium chloride  20 mEq Oral Once  . rosuvastatin  10 mg Oral Daily  . topiramate  150 mg Oral QHS   Continuous Infusions: . azithromycin 500 mg (02/11/20 1159)  . cefTRIAXone (ROCEPHIN)  IV       LOS: 0 days    Time spent: 35 minutes spent in the coordination of care today.    Jonnie Finner, DO [facility]  If 7PM-7AM, please contact night-coverage [email or web address] 02/11/2020, 1:13 PM

## 2020-02-11 NOTE — Evaluation (Signed)
Physical Therapy Evaluation Patient Details Name: [name] MRN: [id number] DOB: 03/20/1971 Today's Date: 02/11/2020   History of Present Illness  49 y.o. female with medical history significant for Chronic lymphedemea, migraine/headache on nurtec/topamax/Galcanezumab q 4wk injection, lymphedema, history of thyroid cancer, chest lymphoma, breast ductal carcinoma, Addison's disease, cervical cancer, superficial venous thrombosis in lower extremity and upper extremities previously on Lovenox now on aspirin 325 mg coming to the oncology clinic with multiple symptoms complaints. She reports she has "Rt sided chf-Adriamycin induced, chronic resp failure w/ hypoxia from bleomycin on lasix and 3-4 l [place] O2". She C/o headache for x few days on left parietal side, c/o dry mouth but swollen arms. She also complains of trouble breathing, blurred vision, headache fatigue, arm swelling. She was dizzy at [facility] few days ago, and  has generalized weakness and reports she almost passed out twice in last 5 days.    Clinical Impression  Pt admitted with above diagnosis. Pt overall requiring increased time with mobility, occasionally reaching for handrail to steady self in hallway with ambulation. Denies falls at home, but does report deconditioning over the past week limiting her ability to complete tasks. Due to swelling in bil hands, difficulty with gripping objects, so recommending OT consult. Pt currently with functional limitations due to the deficits listed below (see PT Problem List). Pt will benefit from skilled PT to increase their independence and safety with mobility to allow discharge to the venue listed below.       Follow Up Recommendations Home health PT    Equipment Recommendations  None recommended by PT    Recommendations for Other Services OT consult     Precautions / Restrictions Precautions Precautions: Other (comment) Precaution Comments: 3.5L supplemental O2 Restrictions Weight  Bearing Restrictions: No      Mobility  Bed Mobility Overal bed mobility: Modified Independent  General bed mobility comments: elevated HOB and slightly increased time to come to EOB  Transfers Overall transfer level: Modified independent Equipment used: None  General transfer comment: steadiness upon standing, standing for a moment once rising before taking steps  Ambulation/Gait Ambulation/Gait assistance: Modified independent (Device/Increase time) Gait Distance (Feet): 180 Feet(1 standing rest break) Assistive device: None Gait Pattern/deviations: Step-through pattern Gait velocity: slightly decreased  General Gait Details: pt ambulates around room and in hallway, occasional hand held assist on handrail in hallway, 1 standing rest break, able to converse with therapist throughout gait with minimal increased work of breathing  Stairs            Wheelchair Mobility    Modified Rankin (Stroke Patients Only)       Balance Overall balance assessment: Modified Independent                      Pertinent Vitals/Pain Pain Assessment: No/denies pain    Home Living Family/patient expects to be discharged to:: Private residence Living Arrangements: Spouse/significant other Available Help at Discharge: Family;Available PRN/intermittently Type of Home: House Home Access: Ramped entrance     Home Layout: One level Home Equipment: Walker - 2 wheels;Wheelchair - manual Additional Comments: pt's house is handicap accessible since 2018    Prior Function Level of Independence: Independent         Comments: Pt independent with ambulation, self care tasks, household chores, drives. Pt's spouse works alternating 1 week home and 1 week at office. Pt lost mobility in BLE in 2018, but progressed back to no AD by 2019. Pt with complex cancer  history since age 23 and possible mets suspected currently.     Hand Dominance   Dominant Hand: Right    Extremity/Trunk  Assessment   Upper Extremity Assessment Upper Extremity Assessment: Defer to OT evaluation(decreased grip strength and finger ROM 2* lymphedema)    Lower Extremity Assessment Lower Extremity Assessment: Overall WFL for tasks assessed(BLE AROM WNL, strength 4/5)    Cervical / Trunk Assessment Cervical / Trunk Assessment: Normal  Communication   Communication: No difficulties  Cognition Arousal/Alertness: Awake/alert Behavior During Therapy: WFL for tasks assessed/performed Overall Cognitive Status: Within Functional Limits for tasks assessed      General Comments: Pt is very friendly, knowledgable about cancer history and medical needs, retired Audiological scientist.      General Comments      Exercises     Assessment/Plan    PT Assessment Patient needs continued PT services  PT Problem List Decreased range of motion;Decreased activity tolerance;Decreased balance       PT Treatment Interventions DME instruction;Gait training;Functional mobility training;Therapeutic activities;Therapeutic exercise;Balance training;Neuromuscular re-education;Patient/family education    PT Goals (Current goals can be found in the Care Plan section)  Acute Rehab PT Goals Patient Stated Goal: home with therapy for deconditioning PT Goal Formulation: With patient Time For Goal Achievement: 02/18/20 Potential to Achieve Goals: Good    Frequency Min 3X/week   Barriers to discharge        Co-evaluation               AM-PAC PT "6 Clicks" Mobility  Outcome Measure Help needed turning from your back to your side while in a flat bed without using bedrails?: None Help needed moving from lying on your back to sitting on the side of a flat bed without using bedrails?: None Help needed moving to and from a bed to a chair (including a wheelchair)?: None Help needed standing up from a chair using your arms (e.g., wheelchair or bedside chair)?: None Help needed to walk in hospital room?: None Help  needed climbing 3-5 steps with a railing? : A Little 6 Click Score: 23    End of Session Equipment Utilized During Treatment: Oxygen Activity Tolerance: Patient tolerated treatment well;Patient limited by fatigue Patient left: in bed;with call bell/phone within reach;with nursing/sitter in room;with family/visitor present Nurse Communication: Mobility status PT Visit Diagnosis: Other abnormalities of gait and mobility (R26.89)    Time: 1607-3710 PT Time Calculation (min) (ACUTE ONLY): 27 min   Charges:   PT Evaluation $PT Eval Moderate Complexity: 1 Mod           Tori Milayna Rotenberg PT, DPT 02/11/20, 12:19 PM

## 2020-02-12 ENCOUNTER — Inpatient Hospital Stay (HOSPITAL_COMMUNITY): Payer: BC Managed Care – PPO

## 2020-02-12 ENCOUNTER — Ambulatory Visit (HOSPITAL_COMMUNITY): Payer: BC Managed Care – PPO

## 2020-02-12 ENCOUNTER — Inpatient Hospital Stay: Payer: BC Managed Care – PPO

## 2020-02-12 DIAGNOSIS — R519 Headache, unspecified: Secondary | ICD-10-CM

## 2020-02-12 DIAGNOSIS — R609 Edema, unspecified: Secondary | ICD-10-CM

## 2020-02-12 LAB — CBC WITH DIFFERENTIAL/PLATELET
Abs Immature Granulocytes: 0.02 10*3/uL (ref 0.00–0.07)
Basophils Absolute: 0.1 10*3/uL (ref 0.0–0.1)
Basophils Relative: 1 %
Eosinophils Absolute: 0.1 10*3/uL (ref 0.0–0.5)
Eosinophils Relative: 1 %
HCT: 35.1 % — ABNORMAL LOW (ref 36.0–46.0)
Hemoglobin: 10.9 g/dL — ABNORMAL LOW (ref 12.0–15.0)
Immature Granulocytes: 0 %
Lymphocytes Relative: 25 %
Lymphs Abs: 2.2 10*3/uL (ref 0.7–4.0)
MCH: 29.2 pg (ref 26.0–34.0)
MCHC: 31.1 g/dL (ref 30.0–36.0)
MCV: 94.1 fL (ref 80.0–100.0)
Monocytes Absolute: 0.8 10*3/uL (ref 0.1–1.0)
Monocytes Relative: 10 %
Neutro Abs: 5.4 10*3/uL (ref 1.7–7.7)
Neutrophils Relative %: 63 %
Platelets: 265 10*3/uL (ref 150–400)
RBC: 3.73 MIL/uL — ABNORMAL LOW (ref 3.87–5.11)
RDW: 14 % (ref 11.5–15.5)
WBC: 8.6 10*3/uL (ref 4.0–10.5)
nRBC: 0 % (ref 0.0–0.2)

## 2020-02-12 LAB — RENAL FUNCTION PANEL
Albumin: 3.5 g/dL (ref 3.5–5.0)
Anion gap: 7 (ref 5–15)
BUN: 15 mg/dL (ref 6–20)
CO2: 26 mmol/L (ref 22–32)
Calcium: 8.6 mg/dL — ABNORMAL LOW (ref 8.9–10.3)
Chloride: 108 mmol/L (ref 98–111)
Creatinine, Ser: 0.9 mg/dL (ref 0.44–1.00)
GFR calc Af Amer: >60 mL/min (ref >60)
GFR calc non Af Amer: >60 mL/min (ref >60)
Glucose, Bld: 107 mg/dL — ABNORMAL HIGH (ref 70–99)
Phosphorus: 2.6 mg/dL (ref 2.5–4.6)
Potassium: 3.6 mmol/L (ref 3.5–5.1)
Sodium: 141 mmol/L (ref 135–145)

## 2020-02-12 LAB — EXPECTORATED SPUTUM ASSESSMENT W GRAM STAIN, RFLX TO RESP C

## 2020-02-12 LAB — MAGNESIUM: Magnesium: 2 mg/dL (ref 1.7–2.4)

## 2020-02-12 LAB — T3, FREE: T3, Free: 2.6 pg/mL (ref 2.0–4.4)

## 2020-02-12 LAB — CORTISOL-AM, BLOOD: Cortisol - AM: 51.4 ug/dL — ABNORMAL HIGH (ref 6.7–22.6)

## 2020-02-12 MED ORDER — HYDROMORPHONE HCL 1 MG/ML IJ SOLN
0.5000 mg | Freq: Once | INTRAMUSCULAR | Status: AC
  Administered 2020-02-12: 0.5 mg via INTRAVENOUS
  Filled 2020-02-12: qty 0.5

## 2020-02-12 MED ORDER — GUAIFENESIN ER 600 MG PO TB12
600.0000 mg | ORAL_TABLET | Freq: Two times a day (BID) | ORAL | Status: DC
  Administered 2020-02-12 – 2020-02-14 (×5): 600 mg via ORAL
  Filled 2020-02-12 (×5): qty 1

## 2020-02-12 NOTE — Progress Notes (Signed)
PT Cancellation Note  Patient Details Name: [name] MRN: [id number] DOB: 23-Oct-1970   Cancelled Treatment:    Reason Eval/Treat Not Completed: Patient declined, no reason specified. Attempted therapy, but upon arrival to room pt received text from spouse that pt's son is unable to come up to visit her in the hospital room. Pt very upset, therapist attempted to console. Pt requesting to speak with someone so therapist let the desk know. Per RN, pt is ambulating in room to restroom independently. Pt d/c to care of nursing for ambulation daily as tolerated for length of stay and recommending HHPT for continued strengthening at home.  Talbot Grumbling PT, DPT 02/12/20, 4:06 PM

## 2020-02-12 NOTE — Consult Note (Signed)
NAME:  [name], MRN:  [id number], DOB:  Jun 15, 1971, LOS: 1 ADMISSION DATE:  02/10/2020, CONSULTATION DATE:  5/14 REFERRING MD:  DR Cherylann Ratel, CHIEF COMPLAINT:  resp failure, cough   Brief History   Complex hx see below  [facility] pulm hx  Copied and pasted below background from Dr Rosario Adie' Nov 2020 puoml note [name] is a 49 year old female history of Hodgkin's at age 61 s/p chemoradiation, breast cancer s/p chemotherapy and bilateral mastectomy in 2000 which is in remission, cervical cancer s/p LEEP 2002, ovarian tumor "precancerous" s/p TH and left oophorectomy in 2011 and right in 2016, hx 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   [facility] for management of cough variant asthma, subcentimeter lung nodules (stable) and chronic hypoxemia of unknown etiology.  She reports history of pulmonary fibrosis however on review of imaging no evidence of parenchymal disease on prior reports or recent chest imaging. Environmental exposures:  Hx chemotherapy  CareEverywhere Records Reviewed and Summarized: Pulm at [facility]      09/18/17 Bronchoscopy with BAL. Negative cultures and negative cytology.      Dr. Geraldo Docker documented on 01/31/18 that patient's chest imaging was presented in 10/2017 and her bronchial fibroelastotic scar was felt to be a nonspecific injury related to reflux or prior XRT and not related to her chronic cough. Oncology at [facility] -       Patient followed for hx DCIS and local recurrence s/p bilaterally mastectomy with no evidence of disease.  recurrence. Currently has monthly port flushes.      The note also comments on PCP work-up comments on patient reporting hematuria with UA showing 1+ blood however this is not seen on recent PCP note.  IM at [facility] -       Per recent PCP note on  03/17/19, patient previously seen by Cardiology for CHF with right heart failure and pH with EF 35% however EF normalized on echo 11/2018   Imaging: CXR 04/12/06 - Normal CXR. No infiltrate, edema or effusion. CT 05/05/09 - Normal parenchymal. Scattered tiny pulmonary nodules with largest 74m in LUL likely benign CT Chest 09/05/17 (report only): Stable bilateral pulmonary nodules up to 45m(LUL 25m40mLUL 2 mm, RLL subpleural 4mm35mredemonstration of tiny right diaphragmatic hernia containing fat CT Chest 11/10/18 - RML 5 mm lung nodule, unchanged  PFT: 04/16/19 FVC 2.42 (65%) FEV1 2.03 (68%) Ratio 76  TLC 84% DLCO 75% Interpretation: Moderate reversible obstructive defect. Significant BD response in FEV1 - per MR 02/12/2020 - variable flow volume loop + .  CPET Per [facility] records  CPET 03/2016 showed functional impairment with VO2max19m% predicted. Normal oxygenation throughout on room air with post-exercise PaO2=125. HR 82% predicted. Abnormal FEV1 but ventilatory reserve remained. Conclusion: Cardiovascular limitation to exercise possibly reflecting deconditioning. Ventilatory impairment present but not exercise-limiting.  6 min walk  Per [facility] records (12/07/16): SpO2 started at 97-98% and decreased to ~94% with ambulation. There was one episode of symptomatic dizziness during which SpO2 was 88% but with questionable tracing. Recovered to high-90s with ~10-15 seconds of standing rest break.  [name] 08/13/19  Unable to complete 6MWT. Patient had episode of coughing while walking and dizziness resulting in witnessed fall. Staff reported patient was disoriented and confused.  POC glucose was 109. Pulse oximetry >96% and tachycardia 110. No desaturations documented during this time.  Ambulatory  O2 Titration 04/13/19 - Patient unable to complete due to dizziness with ambulation. No desaturations documented. SpO2>95% in the time titration was attempted on room air. Per nursing, patient  anxious and holding breath during testing which likely contributed to early termination of test  Labs:  ANA Screen 07/03/17 - Positive  Feb 2020 ANA - neg SSA/SSB - neg Anti-DNA ab -neg MPO/PR3 ab - ng GBM ab - neg Anti-scleroderma ab - neg APLAS - neg ANCA titers - neg RF mildly elevated 18.6  Bronchoscopy: 11/19/18 Results for [name], [name] (MRN [id number]) as of 02/12/2020 18:03  Ref. Range 11/19/2018 11:41  Monocyte-Macrophage-Serous Fluid Latest Ref Range: 50 - 90 % 38 (L)  Fluid Type-FCT Unknown Bronch [place]  Color, Fluid Latest Ref Range: YELLOW  COLORLESS (A)  Total Nucleated Cell Count, Fluid Latest Ref Range: 0 - 1,000 cu mm 145  Lymphs, Fluid Latest Units: % 33  Eos, Fluid Latest Units: % 9  Appearance, Fluid Latest Ref Range: CLEAR  HAZY (A)  Neutrophil Count, Fluid Latest Ref Range: 0 - 25 % 20    Plan she was continued on Qvar and started on interleukin-5 receptor blockade Nucala in November 2020.  She was maintained on oxygen.   HPI - copied and pasted from [facility] MD NOTE   [name] is a 49 y.o. female with medical history significant for Chronic lymphedemea, migraine/headache on nurtec/topamax/Galcanezumab q 4wk injection, lymphedema, history of thyroid cancer, chest lymphoma, breast ductal carcinoma, Addison's disease, cervical cancer, superficial venous thrombosis in lower extremity and upper extremities previously on Lovenox now on aspirin 325 mg coming to the oncology clinic with multiple symptoms complaints. She reports she has "Rt sided chf-Adriamycin induced, chronic resp failure w/ hypoxia from bleomycin on lasix and 3-4 l [place] O2".  She C/o headache for x few days on left parietal side, c/o dry mouth but swollen arms. She also complains of trouble breathing, blurred vision, headache fatigue, arm swelling. She was dizzy at [facility] few days ago, and  has generalized weakness and reports she almost passed out twice in last 5 days. She Called neuro  office at [facility] for headache and reports they are planning for pain management- botox inj in 2 wks. She was seen in Oncology clinic and sent to ED for iv hydration but looked wet and dry- mouth and was told she is "third spacing" but " intravascularly dry" and was given NS bolus for hydration. She underwent extensive work up In ED, VS STABLE, cbc. CMP Normal, CT head normal,  CXR "okay", CTA- bronchiolitis features.MR MRV brain - normal no intracranial venous thrombosis. TSH low  0.06 but improving from before 0.016 yr ago. FT4/FT3 sent, Cortisol ordered.  She also received fentanyl 25 mcg x2, benadryl 12.5 mg iv, ativan 1 mg iv. ED  discussed with oncology and advised admission for " fine tuning"  She also c/o cough with brownish sputum.  She otherwise denies any fever, chills, chest pain, shortness of breath, abdominal pain, focal weakness.  She has chronic nausea takes IV Benadryl injection at home, reports she is allergic to most of the nausea medication.  She wants to try Toradol for the headache.    EVENTS Since admit - entered by CCM MD   5/14 -she reports baseline 3 L hypoxemia.  This has been ongoing since December 2016.  She reports prior to admission her cough got worse than her baseline cough.  She feels that Dr. Cordelia Pen asthma treatment including interleukin-5 receptor blockade  has helped her asthma.  However the cough worsened prior to the admission and despite nebulizers and steroids the cough is worsened.  She had a CT angiogram of the chest that ruled out pulmonary embolism but upon my personal and visualization I agree with the most like attenuation findings.  Not sure if it is groundglass opacities.  Because of the persistence of cough pulmonary has been consulted.    Past Medical History     has a past medical history of Addison's disease ([place]), Adrenal insufficiency ([place]), Anemia, Anxiety, Aortic stenosis, Aortic stenosis, Appendicitis (12/19/09), Appendicitis, Breast cancer  ([place]), Cellulitis of right middle finger (11/07/2018), Cervical cancer ([place]) (12/23/2018), Chest pain, CHF with right heart failure ([place]) (04/17/2017), Chronic respiratory failure with hypoxia ([place]) (12/23/2018), Cough variant asthma (04/13/2019), Depression, GERD (gastroesophageal reflux disease), Headache, Heart murmur, History of kidney stones, Hodgkin lymphoma ([place]), Hodgkin's lymphoma ([place]), Hypertension, Hypoxia, Necrotizing fasciitis ([place]) (12/23/2018), Non-ischemic cardiomyopathy ([place]), Osteoporosis, Palpitations, Pituitary adenoma ([place]) (12/23/2018), Pneumonia, PONV (postoperative nausea and vomiting), Pre-diabetes, Pulmonary hypertension ([place]) (12/23/2018), Raynaud phenomenon, Right heart failure ([place]) (04/17/2017), Supplemental oxygen dependent, SVT (supraventricular tachycardia) ([place]), Tachycardia, and Thyroid cancer ([place]).   reports that she has never smoked. She has never used smokeless tobacco.  Past Surgical History:  Procedure Laterality Date  . ABDOMINAL HYSTERECTOMY    . AMPUTATION Left 01/30/2019   Procedure: Left Index finger amputation with flap reconstruction and repair reconstruction;  Surgeon: Roseanne Kaufman, MD;  Location: [place];  Service: Orthopedics;  Laterality: Left;  . APPENDECTOMY    . CARDIAC CATHETERIZATION  05/18/09   NORMAL CATH  . I & D EXTREMITY Left 12/23/2018   Procedure: IRRIGATION AND DEBRIDEMENT HAND / INDEX FINGER;  Surgeon: Roseanne Kaufman, MD;  Location: [place];  Service: Orthopedics;  Laterality: Left;  Marland Kitchen MASTECTOMY    . PITUITARY SURGERY    . RIGHT/LEFT HEART CATH AND CORONARY ANGIOGRAPHY N/A 04/02/2018   Procedure: RIGHT/LEFT HEART CATH AND CORONARY ANGIOGRAPHY;  Surgeon: Burnell Blanks, MD;  Location: [place] CV [facility];  Service: Cardiovascular;  Laterality: N/A;  . TOTAL THYROIDECTOMY    . VIDEO BRONCHOSCOPY Bilateral 11/14/2018   Procedure: VIDEO BRONCHOSCOPY WITHOUT FLUORO;  Surgeon: Margaretha Seeds, MD;  Location: [place];  Service:  Cardiopulmonary;  Laterality: Bilateral;  . VIDEO BRONCHOSCOPY WITH ENDOBRONCHIAL ULTRASOUND N/A 11/19/2018   Procedure: VIDEO BRONCHOSCOPY WITH ENDOBRONCHIAL ULTRASOUND;  Surgeon: Margaretha Seeds, MD;  Location: [facility] OR;  Service: Thoracic;  Laterality: N/A;    Allergies  Allergen Reactions  . Mushroom Extract Complex Anaphylaxis  . Na Ferric Gluc Cplx In Sucrose Anaphylaxis  . Cymbalta [Duloxetine Hcl] Swelling and Anxiety  . Succinylcholine Other (See Comments)    Lock Jaw  . Buprenorphine Hcl Hives  . Compazine Other (See Comments)    Altered mental status Aggression  . Metoclopramide Other (See Comments)    Dystonia  . Morphine And Related Hives  . Ondansetron Hives and Rash  . Promethazine Hcl Hives  . Tegaderm Ag Mesh [Silver] Rash    Old formulation only, is able to tolerate new formulation    Immunization History  Administered Date(s) Administered  . Influenza, High Dose Seasonal PF 06/19/2019  . Influenza, Quadrivalent, Recombinant, Inj, Pf 08/13/2018  . Influenza, Seasonal, Injecte, Preservative Fre 06/23/2016, 11/12/2017, 08/13/2018  . Influenza,inj,Quad PF,6+ Mos 07/04/2015, 07/04/2015, 06/23/2016, 07/03/2017  . Influenza,inj,Quad PF,6-35 Mos 07/04/2015  . Influenza-Unspecified 07/23/2007, 07/06/2008, 06/13/2009, 07/06/2010, 07/25/2011, 08/01/2012, 07/14/2013, 07/04/2015, 07/04/2015, 06/23/2016, 06/23/2016, 07/03/2017, 11/12/2017, 06/19/2019  . Pneumococcal Conjugate-13 03/20/2019  .  Pneumococcal Polysaccharide-23 02/21/2017  . Pneumococcal-Unspecified 02/21/2017  . Td 12/19/2010  . Varicella 04/02/2007    Family History  Family history unknown: Yes     Current Facility-Administered Medications:  .  acetaminophen (TYLENOL) tablet 650 mg, 650 mg, Oral, Q6H PRN **OR** acetaminophen (TYLENOL) suppository 650 mg, 650 mg, Rectal, Q6H PRN, Kc, Ramesh, MD .  albuterol (PROVENTIL) (2.5 MG/3ML) 0.083% nebulizer solution 2.5 mg, 2.5 mg, Nebulization, Q2H PRN, Kc,  Ramesh, MD, 2.5 mg at 02/12/20 1106 .  aspirin tablet 325 mg, 325 mg, Oral, QHS, Kc, Ramesh, MD, 325 mg at 02/11/20 2204 .  azithromycin (ZITHROMAX) 500 mg in sodium chloride 0.9 % 250 mL IVPB, 500 mg, Intravenous, Q24H, Kyle, Tyrone A, DO, Last Rate: 250 mL/hr at 02/12/20 1152, 500 mg at 02/12/20 1152 .  benzonatate (TESSALON) capsule 200 mg, 200 mg, Oral, TID PRN, Kc, Ramesh, MD .  budesonide (PULMICORT) nebulizer solution 0.25 mg, 0.25 mg, Nebulization, BID, Kc, Ramesh, MD, 0.25 mg at 02/12/20 1113 .  cefTRIAXone (ROCEPHIN) 1 g in sodium chloride 0.9 % 100 mL IVPB, 1 g, Intravenous, Q24H, Kyle, Tyrone A, DO, Last Rate: 200 mL/hr at 02/12/20 1343, 1 g at 02/12/20 1343 .  Chlorhexidine Gluconate Cloth 2 % PADS 6 each, 6 each, Topical, Daily, Kyle, Tyrone A, DO, 6 each at 02/12/20 1128 .  clonazePAM (KLONOPIN) tablet 1 mg, 1 mg, Oral, BID, Kc, Ramesh, MD, 1 mg at 02/12/20 0937 .  colchicine tablet 0.6 mg, 0.6 mg, Oral, Q1500, Kc, Ramesh, MD, 0.6 mg at 02/12/20 1550 .  diphenhydrAMINE (BENADRYL) injection 50 mg, 50 mg, Intravenous, Q6H PRN, Kyle, Tyrone A, DO, 50 mg at 02/12/20 1246 .  dronabinol (MARINOL) capsule 2.5 mg, 2.5 mg, Oral, BID AC, Kyle, Tyrone A, DO, 2.5 mg at 02/12/20 1727 .  escitalopram (LEXAPRO) tablet 20 mg, 20 mg, Oral, QPM, Kc, Ramesh, MD, 20 mg at 02/12/20 1727 .  estradiol (ESTRACE) tablet 2 mg, 2 mg, Oral, QPM, Kc, Ramesh, MD, 2 mg at 02/11/20 1758 .  fluticasone furoate-vilanterol (BREO ELLIPTA) 200-25 MCG/INH 1 puff, 1 puff, Inhalation, Daily, Kc, Ramesh, MD, 1 puff at 02/12/20 1118 .  furosemide (LASIX) injection 20 mg, 20 mg, Intravenous, BID, Kc, Ramesh, MD, 20 mg at 02/12/20 1728 .  gi cocktail (Maalox,Lidocaine,Donnatal), 30 mL, Oral, Daily PRN, Kc, Ramesh, MD .  guaiFENesin (MUCINEX) 12 hr tablet 600 mg, 600 mg, Oral, BID, Kyle, Tyrone A, DO, 600 mg at 02/12/20 1128 .  hydrocortisone sodium succinate (SOLU-CORTEF) 100 MG injection 50 mg, 50 mg, Intravenous, Q8H, Kyle,  Tyrone A, DO, 50 mg at 02/12/20 1342 .  HYDROmorphone (DILAUDID) tablet 1-2 mg, 1-2 mg, Oral, Q6H PRN, Opyd, Ilene Qua, MD, 2 mg at 02/12/20 1127 .  levothyroxine (SYNTHROID) tablet 112 mcg, 112 mcg, Oral, Q0600, Antonieta Pert, MD, 112 mcg at 02/12/20 0648 .  LORazepam (ATIVAN) tablet 0.5-1 mg, 0.5-1 mg, Oral, BID PRN, Lupita Leash, Ramesh, MD, 1 mg at 02/12/20 0437 .  metoprolol succinate (TOPROL-XL) 24 hr tablet 37.5 mg, 37.5 mg, Oral, Daily, Kc, Ramesh, MD, 37.5 mg at 02/12/20 0648 .  rosuvastatin (CRESTOR) tablet 10 mg, 10 mg, Oral, Daily, Kc, Ramesh, MD, 10 mg at 02/12/20 0938 .  sucralfate (CARAFATE) tablet 1 g, 1 g, Oral, Daily PRN, Kc, Ramesh, MD .  topiramate (TOPAMAX) tablet 150 mg, 150 mg, Oral, QHS, Kc, Ramesh, MD, 150 mg at 02/11/20 2203 .  zolpidem (AMBIEN) tablet 5 mg, 5 mg, Oral, QHS PRN, Opyd, Ilene Qua, MD   Significant  Hospital Events   02/10/2020  Admit 5/14 - ccm conslt. onc consult  Consults:  x  Procedures:  x  Significant Diagnostic Tests:  x  Micro Data:  covid Urine swtrep 5/14 Urine leg 5/14 rvp 5/14  Antimicrobials:  Ceftriaxone azithro   Objective   Blood pressure (!) 141/86, pulse 97, temperature 97.8 F (36.6 C), temperature source Oral, resp. rate 16, height _0  (1.651 m), weight 87.2 kg, SpO2 100 %.        Intake/Output Summary (Last 24 hours) at 02/12/2020 1815 Last data filed at 02/12/2020 1759 Gross per 24 hour  Intake 740.21 ml  Output 2700 ml  Net -1959.79 ml   Filed Weights   02/10/20 2315  Weight: 87.2 kg    Examination: General: Overweight female on oxygen.  Sitting down trying to have dinner.  Sitting on the bed HENT: Oxygen on.  No neck nodes no elevated JVP.  Has a laryngeal quality to the cough Lungs: No respiratory distress but on 3 to 4 L of oxygen.  Right Port-A-Cath present no obvious crackles Cardiovascular: Normal heart sounds Abdomen: Soft nontender Extremities: Left index finger missing partial amputation.  Bilateral  lymphedema especially on the left upper extremity Neuro: Alert and oriented x3.  Moves all fours Pleasant GU: Not examined  Resolved Hospital Problem list   X  Assessment & Plan:  Chronic cough worse History of asthma -on interleukin-5 receptor blockade treatment History of chronic hypoxemia -unclear etiology.  Previous CT chest clear  Plan  -Check respiratory virus panel, urine strep and urine Legionella and sepsis biomarkers including procalcitonin lactic acid and then narrow antibiotics accordingly  -Check pulmonary function test with particular attention on flow-volume loop to look for any variable obstruction suggestive and consistent with irritable larynx or vocal cord dysfunction [2020 pulmonary function test suggested that]    -Noted patient is on Breo dry powder inhaler  -Check allergy panel  & IgE  -Overall check immunoglobulin profile including IgA IgM and IgG  Check high-resolution CT chest any evidence of interstitial lung disease currently  Check echocardiogram  -Might need laryngoscopy to evaluate vocal cords  -Continue bronchodilators and steroids as currently scheduled  Best practice:  According to the hospitalist    SIGNATURE    Dr. Brand Males, M.D., F.C.C.P,  Pulmonary and Critical Care Medicine Staff Physician, [place] Director - Interstitial Lung Disease  Program  Pulmonary [place] [facility] [place], [place], [address]  Pager: [phone], If no answer or between  15:00h - 7:00h: call [phone] Telephone: [phone]  6:32 PM 02/12/2020    LABS    PULMONARY No results for input(s): PHART, PCO2ART, PO2ART, HCO3, TCO2, O2SAT in the last 168 hours.  Invalid input(s): PCO2, PO2  CBC Recent Labs  Lab 02/10/20 1020 02/11/20 0530 02/12/20 0805  HGB 12.7 11.2* 10.9*  HCT 39.3 35.6* 35.1*  WBC 7.1 5.7 8.6  PLT 297 269 265    COAGULATION No results for input(s):  INR in the last 168 hours.  CARDIAC  No results for input(s): TROPONINI in the last 168 hours. No results for input(s): PROBNP in the last 168 hours.   CHEMISTRY Recent Labs  Lab 02/10/20 1020 02/10/20 1020 02/11/20 0530 02/12/20 0805  NA 141  --  141 141  K 3.4*   < > 3.9 3.6  CL 106  --  104 108  CO2 24  --  26 26  GLUCOSE 98  --  103* 107*  BUN 15  --  16 15  CREATININE 0.99  --  1.03* 0.90  CALCIUM 8.4*  --  8.5* 8.6*  MG  --   --   --  2.0  PHOS  --   --   --  2.6   < > = values in this interval not displayed.   Estimated Creatinine Clearance: 83.4 mL/min (by C-G formula based on SCr of 0.9 mg/dL).   LIVER Recent Labs  Lab 02/10/20 1020 02/11/20 0530 02/12/20 0805  AST 36 35  --   ALT 43 36  --   ALKPHOS 59 47  --   BILITOT 0.4 0.6  --   PROT 7.3 6.5  --   ALBUMIN 3.7 3.5 3.5     INFECTIOUS No results for input(s): LATICACIDVEN, PROCALCITON in the last 168 hours.   ENDOCRINE CBG (last 3)  No results for input(s): GLUCAP in the last 72 hours.       IMAGING x48h  - image(s) personally visualized  -   highlighted in bold MR STERNUM W WO CONTRAST  Result Date: 02/11/2020 CLINICAL DATA:  History of lymphoma question of sternal lesion EXAM: MR STERNUM WITHOUT AND WITH CONTRAST TECHNIQUE: Multiplanar MR images of the sternum were obtained within the. CONTRAST:  8m GADAVIST GADOBUTROL 1 MMOL/ML IV SOLN COMPARISON:  CT chest Feb 10, 2020 FINDINGS: Musculoskeletal: Normal marrow signal seen throughout the visualized osseous structures and sternum. No osseous fracture, pathologic marrow infiltration or marrow edema. No abnormal enhancement seen throughout. The sternomanubrial joints are intact. Soft tissues: Bilateral breast prosthesis are seen with findings suggestive of internal capsule rupture. A right-sided MediPort catheter is noted. No areas of abnormal enhancement along the chest wall. IMPRESSION: No evidence metastatic disease involving the chest wall.  Electronically Signed   By: BPrudencio PairM.D.   On: 02/11/2020 22:19   VAS [place]PPER EXTREMITY VENOUS DUPLEX  Result Date: 02/12/2020 UPPER VENOUS STUDY  Indications: Edema Comparison Study: 11/16/2019-negative right upper extremity venous duplex Performing Technologist: MMaudry MayhewMHA, RDMS, RVT, RDCS  Examination Guidelines: A complete evaluation includes B-mode imaging, spectral Doppler, color Doppler, and power Doppler as needed of all accessible portions of each vessel. Bilateral testing is considered an integral part of a complete examination. Limited examinations for reoccurring indications may be performed as noted.  Right Findings: +----------+------------+---------+-----------+----------+-------+ RIGHT     CompressiblePhasicitySpontaneousPropertiesSummary +----------+------------+---------+-----------+----------+-------+ IJV           Full       Yes       Yes                      +----------+------------+---------+-----------+----------+-------+ Subclavian    Full       Yes       Yes                      +----------+------------+---------+-----------+----------+-------+ Axillary      Full       Yes       Yes                      +----------+------------+---------+-----------+----------+-------+ Brachial      Full       Yes       Yes                      +----------+------------+---------+-----------+----------+-------+ Radial  Full                                          +----------+------------+---------+-----------+----------+-------+ Ulnar         Full                                          +----------+------------+---------+-----------+----------+-------+ Cephalic      Full                                          +----------+------------+---------+-----------+----------+-------+ Basilic       Full                                          +----------+------------+---------+-----------+----------+-------+  Left Findings:  +----------+------------+---------+-----------+----------+--------------+ LEFT      CompressiblePhasicitySpontaneousProperties   Summary     +----------+------------+---------+-----------+----------+--------------+ IJV           Full       Yes       Yes                             +----------+------------+---------+-----------+----------+--------------+ Subclavian    Full       Yes       Yes                             +----------+------------+---------+-----------+----------+--------------+ Axillary      Full       Yes       Yes                             +----------+------------+---------+-----------+----------+--------------+ Brachial      Full       Yes       Yes                             +----------+------------+---------+-----------+----------+--------------+ Radial        Full                                                 +----------+------------+---------+-----------+----------+--------------+ Ulnar         Full                                                 +----------+------------+---------+-----------+----------+--------------+ Cephalic      Full                                                 +----------+------------+---------+-----------+----------+--------------+ Basilic  Not visualized +----------+------------+---------+-----------+----------+--------------+  Summary:  Right: No evidence of deep vein thrombosis in the upper extremity. No evidence of superficial vein thrombosis in the upper extremity.  Left: No evidence of deep vein thrombosis in the upper extremity. No evidence of superficial vein thrombosis in the upper extremity.  *See table(s) above for measurements and observations.    Preliminary

## 2020-02-12 NOTE — Progress Notes (Signed)
[name] Kitchen  PROGRESS NOTE    [name]  TIW:[id number] DOB: [birth year] DOA: 02/10/2020 PCP: Su Grand, MD   Brief Narrative:   [name] a 49 y.o.femalewith medical history significant forChronic lymphedemea, migraine/headacheon nurtec/topamax/Galcanezumab q 4wk injection,lymphedema,history of thyroid cancer, chest lymphoma, breast ductal carcinoma, Addison's disease, cervical cancer, superficial venous thrombosis in lower extremity and upper extremities previously on Lovenox now on aspirin325 mgcoming to the oncology clinic with multiple symptoms complaints. She reports she has "Rt sided chf-Adriamycin induced, chronicresp failure w/ hypoxia from bleomycin on lasix and 3-4 l [place] O2".  SheC/o headache for xfewdays on left parietal side, c/o dry mouth but swollen arms. She also complains oftrouble breathing, blurred vision,headachefatigue, arm swelling.She was dizzy at [facility] days ago,and hasgeneralized weakness and reports shealmost passed out twice in last 5 days. She Called neuro office at [facility] headache and reports they areplanning for pain management- botox inj in 2 wks. She was seen in Oncology clinic and sent to ED for iv hydration but looked wet and dry- mouthand was told she is "third spacing" but "intravascularly dry" and was given NSbolus for hydration.She underwent extensive work up In ED,VSSTABLE, cbc. CMP Normal, CT head normal, CXR "okay", CTA- bronchiolitis features.MR MRV brain - normal no intracranial venous thrombosis. TSH low 0.06 but improving from before 0.016 yr ago. FT4/FT3 sent, Cortisol ordered. She also received fentanyl 25 mcg x2, benadryl 12.5 mg iv, ativan 1 mg iv. ED discussed with oncology and advised admission for " fine tuning" She also c/ocough with brownish sputum.She otherwise denies any fever, chills, chest pain, shortness of breath, abdominal pain, focal weakness. She has chronic nausea takes IV Benadryl injection at  home, reports she is allergic to most of the nausea medication. She wants to try Toradol for the headache.  5/13: C/o continued cough today and edema. She is tight and course on exam. Check sputum Cx, change abx to rocephin/zithro, increase steroids. Spoke with onco. Will get contrasted MRI of sternum. She reports low UOP. Get bladder scan and follows I&Os.   5/14: MRI sternum does not reveal any CA. It did show ruptured breast implants. I reviewed with plastic surgery. This can be followed up outpatient. Her respiratory status is not improved. She is still tight w/ terrible cough and is now producing sputum that is blood-tinged. I have already increased her cortef to 66m TID and she is on nebs and abx. I have spoken with pulm. They will take a look. No good reason for her lymphedema. Checking BUE dopplers, but I don't think I'll find a clot. She's starting to move fluid with lasix now. Will continue, but without evidence of HF or CKD; this isn't the long term answer.   Assessment & Plan:   Principal Problem:   Headache Active Problems:   Generalized weakness  Left-sided headache w/ history of migraine     - on nurtec/topamax/Galcanezumab q 4wk injection at home.     - Home medications resumed, added IV Toradol as needed     - She has a follow-up with [facility] neurology/pain clinic for Botox trial for her headache.  CT head MRV reviewed no acute finding.     - Nonfocal on exam.     - 5/13: she is not focused on HA this AM, continue to monitor  Generalized weakness/dizziness     - orthostatic blood pressures pending     - 5/13: PT eval: HHPT     - random cortisol is 5.9; check AM  cortisol     - 5/14: AM cortisol is up. She's on stress dose cortef. Follow.   Nausea     - continue benadryl, ativan     - 5/13: will see if marinol will help     - 5/14: N improved with marinol  Upper extremity lymphedema     - worsening edema history of right-sided heart failure     - CXR and CTA w/ no  features of CHF or PE.      - placed on lasix 100m IV     - 5/13: she reports poor UOP -- nothing is documented     - SCr is stable. BNP is 90.     - 5/14: BUE dopplers ordered. Now moving fluid with lasix. Monitor.   Cough Sputum production     - CTA shows bronchiolitis     - 5/13: has chronically been on doxy; will switch her to rocephin/zithro     - continue qvar inhaler, albuterol inhaler, and nebs     - sputum Cx ordered     - she sounds tight and course on exam; increase steroids     - 5/14: still tight and with terrible cough; producing some blood-tinged sputum; have spoken with PCCM. They will take a look.   Hypokalemia     - resolved  Chronic hypoxic respiratory failure Chronic right-sided CHF     - Continue home oxygen 3 to 4 L nasal cannula.     - 5/13: BNP is 90; CTA w/o cardiomegaly or pulm edema     - now on lasix 20 mg IV BID     - on 3L [place] during exam  Hx of thyroid cancer     - TSH low, patient reports chronically low.     - 5/13: T4 is ok; T3 is pending. Continue synthroid     - follow up with endocrine   Chest lymphoma     - Patient is scheduled to get MRI sternum by Dr. GLindi Adie     - 5/13: spoke with dr. GLindi Adiethis afternoon. Pt has a history of multiple CA and still not sure what is her root problem. They have requested that we get the MRI of the sternum as a contrasted study while she is here. We will order this exam. Appreciate their assistance. They will be following during her stay.       - 5/14: MRI neg for CA, no further onco recs. Appreciate assistance.   Addison's disease     - No features suggestive of Addison's crisis     - orthostatic blood pressure pending      - 5/13: random cortisol was 5.9; check an AM cortisol     - she is on her home dose of cortef; may increase that to stress dosing     - 5/14: see above  Hx of venous thrombosis in lower extremity and upper extremities     - previously on Lovenox now on aspirin 325 mg;  continue ASA  Anxiety Depression     - continue her Klonopin, Lexapro  DVT prophylaxis: SCDs Code Status: FULL Family Communication: Spoke with husband at bedside   Status is: Inpatient  Remains inpatient appropriate because:IV treatments appropriate due to intensity of illness or inability to take PO   Dispo: The patient is from: Home              Anticipated d/c is to: Home  Anticipated d/c date is: 2 days              Patient currently is not medically stable to d/c.  Consultants:   Pulmonology  Heme/Onco  Antimicrobials:  . Rocephin, azithromycin   ROS:  Reports cough, dyspnea, swelling . Remainder 10-pt ROS is negative for all not previously mentioned.  Subjective: "I finally started peeing."  Objective: Vitals:   02/11/20 1000 02/11/20 1333 02/11/20 2013 02/12/20 0526  BP:  (!) 118/53 127/69 (!) 114/55  Pulse:  90 100 82  Resp:  _0 Temp:  98.1 F (36.7 C) 97.6 F (36.4 C) (!) 97.5 F (36.4 C)  TempSrc:  Oral Oral Oral  SpO2: 99% 98% 95% 94%  Weight:      Height:        Intake/Output Summary (Last 24 hours) at 02/12/2020 0758 Last data filed at 02/11/2020 2000 Gross per 24 hour  Intake 300 ml  Output 1850 ml  Net -1550 ml   Filed Weights   02/10/20 2315  Weight: 87.2 kg    Examination:  General: 49 y.o. female resting in bed in NAD Cardiovascular: RRR, +S1, S2, no m/g/r, equal pulses throughout Respiratory: tight throughout and course; on her baseline O2 use at 3.5L however, she does not feel at baseline GI: BS+, NDNT, no masses noted, no organomegaly noted MSK: No c/c; BUE edema 3+ Neuro: A&O x 3, no focal deficits Psyc: Appropriate interaction and affect, calm/cooperative   Data Reviewed: I have personally reviewed following labs and imaging studies.  CBC: Recent Labs  Lab 02/10/20 1020 02/11/20 0530  WBC 7.1 5.7  NEUTROABS 3.7  --   HGB 12.7 11.2*  HCT 39.3 35.6*  MCV 91.4 93.0  PLT 297 353   Basic  Metabolic Panel: Recent Labs  Lab 02/10/20 1020 02/11/20 0530  NA 141 141  K 3.4* 3.9  CL 106 104  CO2 24 26  GLUCOSE 98 103*  BUN 15 16  CREATININE 0.99 1.03*  CALCIUM 8.4* 8.5*   GFR: Estimated Creatinine Clearance: 72.9 mL/min (A) (by C-G formula based on SCr of 1.03 mg/dL (H)). Liver Function Tests: Recent Labs  Lab 02/10/20 1020 02/11/20 0530  AST 36 35  ALT 43 36  ALKPHOS 59 47  BILITOT 0.4 0.6  PROT 7.3 6.5  ALBUMIN 3.7 3.5   No results for input(s): LIPASE, AMYLASE in the last 168 hours. No results for input(s): AMMONIA in the last 168 hours. Coagulation Profile: No results for input(s): INR, PROTIME in the last 168 hours. Cardiac Enzymes: No results for input(s): CKTOTAL, CKMB, CKMBINDEX, TROPONINI in the last 168 hours. BNP (last 3 results) No results for input(s): PROBNP in the last 8760 hours. HbA1C: No results for input(s): HGBA1C in the last 72 hours. CBG: No results for input(s): GLUCAP in the last 168 hours. Lipid Profile: No results for input(s): CHOL, HDL, LDLCALC, TRIG, CHOLHDL, LDLDIRECT in the last 72 hours. Thyroid Function Tests: Recent Labs    02/10/20 1241 02/11/20 0530  TSH 0.060*  --   FREET4 1.07  --   T3FREE  --  2.6   Anemia Panel: No results for input(s): VITAMINB12, FOLATE, FERRITIN, TIBC, IRON, RETICCTPCT in the last 72 hours. Sepsis Labs: No results for input(s): PROCALCITON, LATICACIDVEN in the last 168 hours.  Recent Results (from the past 240 hour(s))  SARS Coronavirus 2 by RT PCR (hospital order, performed in [facility] hospital lab) Nasopharyngeal Nasopharyngeal Swab     Status: None  Collection Time: 02/10/20  8:47 PM   Specimen: Nasopharyngeal Swab  Result Value Ref Range Status   SARS Coronavirus 2 NEGATIVE NEGATIVE Final    Comment: (NOTE) SARS-CoV-2 target nucleic acids are NOT DETECTED. The SARS-CoV-2 RNA is generally detectable in upper and lower respiratory specimens during the acute phase of infection.  The lowest concentration of SARS-CoV-2 viral copies this assay can detect is 250 copies / mL. A negative result does not preclude SARS-CoV-2 infection and should not be used as the sole basis for treatment or other patient management decisions.  A negative result may occur with improper specimen collection / handling, submission of specimen other than nasopharyngeal swab, presence of viral mutation(s) within the areas targeted by this assay, and inadequate number of viral copies (<250 copies / mL). A negative result must be combined with clinical observations, patient history, and epidemiological information. Fact Sheet for Patients:   StrictlyIdeas.no Fact Sheet for Healthcare Providers: BankingDealers.co.za This test is not yet approved or cleared  by the [place] FDA and has been authorized for detection and/or diagnosis of SARS-CoV-2 by FDA under an Emergency Use Authorization (EUA).  This EUA will remain in effect (meaning this test can be used) for the duration of the COVID-19 declaration under Section 564(b)(1) of the Act, 21 U.S.C. section 360bbb-3(b)(1), unless the authorization is terminated or revoked sooner. Performed at [facility], [place] [address] [place], [place] [address]       Radiology Studies: CT Head Wo Contrast  Result Date: 02/10/2020 CLINICAL DATA:  Headache, history of multiple cancers EXAM: CT HEAD WITHOUT CONTRAST TECHNIQUE: Contiguous axial images were obtained from the base of the skull through the vertex without intravenous contrast. COMPARISON:  2013 FINDINGS: Brain: There is no acute intracranial hemorrhage, mass effect, or edema. Gray-white differentiation is preserved. There is no extra-axial fluid collection. Ventricles and sulci are within normal limits in size and configuration. Vascular: No hyperdense vessel or unexpected calcification. Skull: Calvarium is unremarkable.  Sinuses/Orbits: No acute finding. Other: None. IMPRESSION: No acute intracranial abnormality. Electronically Signed   By: Macy Mis M.D.   On: 02/10/2020 13:49   CT Angio Chest PE W and/or Wo Contrast  Result Date: 02/10/2020 CLINICAL DATA:  Shortness of breath, hemoptysis, cough. History of multiple cancers EXAM: CT ANGIOGRAPHY CHEST WITH CONTRAST TECHNIQUE: Multidetector CT imaging of the chest was performed using the standard protocol during bolus administration of intravenous contrast. Multiplanar CT image reconstructions and MIPs were obtained to evaluate the vascular anatomy. CONTRAST:  15m OMNIPAQUE IOHEXOL 350 MG/ML SOLN COMPARISON:  12/29/2019 FINDINGS: Cardiovascular: Satisfactory opacification of the pulmonary arteries to the segmental level. No evidence of pulmonary embolism. Normal heart size. No pericardial effusion. Thoracic aorta is nonaneurysmal. Minimal atherosclerotic calcification of the aortic arch. Right-sided chest port is seen with distal tip terminating at the superior cavoatrial junction. Mediastinum/Nodes: Prior right axillary lymph node dissection. No axillary lymphadenopathy. No enlarged mediastinal or hilar lymph nodes. Status post thyroidectomy. Trachea and esophagus are within normal limits. Lungs/Pleura: Stable subpleural fat containing nodule along the posterior right hemidiaphragm (series 6, image 97) is unchanged from 2016, benign. No new pulmonary nodule. There is mosaic attenuation of the bilateral lung fields. No focal lobar consolidation. No pleural effusion or pneumothorax. Upper Abdomen: No acute abnormality. Musculoskeletal: Bilateral breast prostheses. No acute osseous abnormality. No suspicious lytic or sclerotic bone lesion. Review of the MIP images confirms the above findings. IMPRESSION: 1. No evidence of pulmonary embolism. 2. Mosaic attenuation of the bilateral  lung fields, which can be seen with small airways disease/bronchiolitis. A Electronically Signed    By: Davina Poke D.O.   On: 02/10/2020 14:59   MR STERNUM W WO CONTRAST  Result Date: 02/11/2020 CLINICAL DATA:  History of lymphoma question of sternal lesion EXAM: MR STERNUM WITHOUT AND WITH CONTRAST TECHNIQUE: Multiplanar MR images of the sternum were obtained within the. CONTRAST:  34m GADAVIST GADOBUTROL 1 MMOL/ML IV SOLN COMPARISON:  CT chest Feb 10, 2020 FINDINGS: Musculoskeletal: Normal marrow signal seen throughout the visualized osseous structures and sternum. No osseous fracture, pathologic marrow infiltration or marrow edema. No abnormal enhancement seen throughout. The sternomanubrial joints are intact. Soft tissues: Bilateral breast prosthesis are seen with findings suggestive of internal capsule rupture. A right-sided MediPort catheter is noted. No areas of abnormal enhancement along the chest wall. IMPRESSION: No evidence metastatic disease involving the chest wall. Electronically Signed   By: BPrudencio PairM.D.   On: 02/11/2020 22:19   DG Chest Portable 1 View  Result Date: 02/10/2020 CLINICAL DATA:  Shortness of breath. EXAM: PORTABLE CHEST 1 VIEW COMPARISON:  July 09, 2019. FINDINGS: The heart size and mediastinal contours are within normal limits. Stable right internal jugular Port-A-Cath is noted. Minimal bibasilar subsegmental atelectasis is noted. No pneumothorax or pleural effusion is noted. The visualized skeletal structures are unremarkable. IMPRESSION: Minimal bibasilar subsegmental atelectasis. Electronically Signed   By: JMarijo ConceptionM.D.   On: 02/10/2020 13:08   MR MRV HEAD W WO CONTRAST  Result Date: 02/10/2020 CLINICAL DATA:  History of breast cancer and lymphoma. History of thyroid cancer and cervical cancer. Headache. Assess for intracranial venous thrombosis. EXAM: MR VENOGRAM HEAD WITHOUT AND WITH CONTRAST TECHNIQUE: Angiographic images of the intracranial venous structures were obtained using MRV technique without and with intravenous contrast. CONTRAST:   117mGADAVIST GADOBUTROL 1 MMOL/ML IV SOLN COMPARISON:  Head CT same day FINDINGS: The study is normal. Superior sagittal sinus is patent and normal. Transverse sinuses are normal. Sigmoid sinuses are normal. Jugular veins both show flow. Deep veins are patent. No evidence of superficial venous thrombosis. No abnormal brain enhancement seen on limited parenchymal imaging. IMPRESSION: Normal study. No cause of headache identified. No evidence of intracranial venous thrombosis. Electronically Signed   By: MaNelson Chimes.D.   On: 02/10/2020 16:16     Scheduled Meds: . aspirin  325 mg Oral QHS  . budesonide (PULMICORT) nebulizer solution  0.25 mg Nebulization BID  . Chlorhexidine Gluconate Cloth  6 each Topical Daily  . clonazePAM  1 mg Oral BID  . colchicine  0.6 mg Oral Q1500  . dronabinol  2.5 mg Oral BID AC  . enoxaparin (LOVENOX) injection  40 mg Subcutaneous Q24H  . escitalopram  20 mg Oral QPM  . estradiol  2 mg Oral QPM  . fluticasone furoate-vilanterol  1 puff Inhalation Daily  . furosemide  20 mg Intravenous BID  . hydrocortisone sod succinate (SOLU-CORTEF) inj  50 mg Intravenous Q8H  . levothyroxine  112 mcg Oral Q0600  . metoprolol succinate  37.5 mg Oral Daily  . rosuvastatin  10 mg Oral Daily  . topiramate  150 mg Oral QHS   Continuous Infusions: . azithromycin 500 mg (02/11/20 1159)  . cefTRIAXone (ROCEPHIN)  IV 1 g (02/11/20 1319)     LOS: 1 day    Time spent: 35 minutes spent in the coordination of care today.    TyJonnie FinnerDO [facility]  If 7PM-7AM, please contact night-coverage  [email or web address] 02/12/2020, 7:58 AM

## 2020-02-12 NOTE — Telephone Encounter (Signed)
Staff - please let patient know that I am away on maternity leave and have her scheduled with and MD/DO in a 30 minute slot. Her last clinic visit was in 08/31/19 prior to initiation of Nucala.  Rodman Pickle, M.D. [facility] Pulmonary/Critical Care Medicine 02/12/2020 4:40 PM

## 2020-02-12 NOTE — Progress Notes (Signed)
Bilateral upper extremity venous duplex completed. Refer to "CV Proc" under chart review to view preliminary results.  02/12/2020 3:47 PM Kelby Aline., MHA, RVT, RDCS, RDMS

## 2020-02-12 NOTE — Progress Notes (Signed)
Hematology oncology Subjective: Peripheral lymphedema appears to be starting to get better.  Headaches are starting to improve. Objective: Vitals:   02/12/20 1113 02/12/20 1342  BP:  (!) 141/86  Pulse:  97  Resp:  16  Temp:  97.8 F (36.6 C)  SpO2: 99% 100%  MRI sternum: No evidence of malignancy. MRI head: No intracranial pathology. CT angio chest: No PE, bilateral lung fields bronchiolitis  I discussed with the patient that there is no evidence of recurrent cancer. Extensive lymphedema could be hormonally driven.  Patient is being treated for adrenal insufficiency. No additional oncology recommendations at this time.

## 2020-02-13 ENCOUNTER — Inpatient Hospital Stay (HOSPITAL_COMMUNITY): Payer: BC Managed Care – PPO

## 2020-02-13 DIAGNOSIS — R05 Cough: Secondary | ICD-10-CM

## 2020-02-13 DIAGNOSIS — R600 Localized edema: Secondary | ICD-10-CM

## 2020-02-13 DIAGNOSIS — E271 Primary adrenocortical insufficiency: Secondary | ICD-10-CM

## 2020-02-13 DIAGNOSIS — I340 Nonrheumatic mitral (valve) insufficiency: Secondary | ICD-10-CM

## 2020-02-13 DIAGNOSIS — I351 Nonrheumatic aortic (valve) insufficiency: Secondary | ICD-10-CM

## 2020-02-13 DIAGNOSIS — J9621 Acute and chronic respiratory failure with hypoxia: Secondary | ICD-10-CM

## 2020-02-13 DIAGNOSIS — R601 Generalized edema: Secondary | ICD-10-CM

## 2020-02-13 LAB — MAGNESIUM: Magnesium: 2 mg/dL (ref 1.7–2.4)

## 2020-02-13 LAB — CBC WITH DIFFERENTIAL/PLATELET
Abs Immature Granulocytes: 0.03 10*3/uL (ref 0.00–0.07)
Basophils Absolute: 0 10*3/uL (ref 0.0–0.1)
Basophils Relative: 1 %
Eosinophils Absolute: 0 10*3/uL (ref 0.0–0.5)
Eosinophils Relative: 0 %
HCT: 34.9 % — ABNORMAL LOW (ref 36.0–46.0)
Hemoglobin: 10.7 g/dL — ABNORMAL LOW (ref 12.0–15.0)
Immature Granulocytes: 0 %
Lymphocytes Relative: 26 %
Lymphs Abs: 2.1 10*3/uL (ref 0.7–4.0)
MCH: 29.1 pg (ref 26.0–34.0)
MCHC: 30.7 g/dL (ref 30.0–36.0)
MCV: 94.8 fL (ref 80.0–100.0)
Monocytes Absolute: 0.7 10*3/uL (ref 0.1–1.0)
Monocytes Relative: 9 %
Neutro Abs: 5.3 10*3/uL (ref 1.7–7.7)
Neutrophils Relative %: 64 %
Platelets: 271 10*3/uL (ref 150–400)
RBC: 3.68 MIL/uL — ABNORMAL LOW (ref 3.87–5.11)
RDW: 14 % (ref 11.5–15.5)
WBC: 8.2 10*3/uL (ref 4.0–10.5)
nRBC: 0 % (ref 0.0–0.2)

## 2020-02-13 LAB — PROCALCITONIN: Procalcitonin: <0.1 ng/mL

## 2020-02-13 LAB — RENAL FUNCTION PANEL
Albumin: 3.7 g/dL (ref 3.5–5.0)
Anion gap: 6 (ref 5–15)
BUN: 14 mg/dL (ref 6–20)
CO2: 25 mmol/L (ref 22–32)
Calcium: 9 mg/dL (ref 8.9–10.3)
Chloride: 108 mmol/L (ref 98–111)
Creatinine, Ser: 0.91 mg/dL (ref 0.44–1.00)
GFR calc Af Amer: >60 mL/min (ref >60)
GFR calc non Af Amer: >60 mL/min (ref >60)
Glucose, Bld: 110 mg/dL — ABNORMAL HIGH (ref 70–99)
Phosphorus: 3.7 mg/dL (ref 2.5–4.6)
Potassium: 3.6 mmol/L (ref 3.5–5.1)
Sodium: 139 mmol/L (ref 135–145)

## 2020-02-13 LAB — RESPIRATORY PANEL BY PCR

## 2020-02-13 LAB — LACTIC ACID, PLASMA: Lactic Acid, Venous: 1.9 mmol/L (ref 0.5–1.9)

## 2020-02-13 LAB — ECHOCARDIOGRAM COMPLETE
Height: 65 in
Weight: 3075.86 oz

## 2020-02-13 LAB — STREP PNEUMONIAE URINARY ANTIGEN: Strep Pneumo Urinary Antigen: NEGATIVE

## 2020-02-13 MED ORDER — HYDROMORPHONE HCL 1 MG/ML IJ SOLN
1.0000 mg | Freq: Once | INTRAMUSCULAR | Status: AC
  Administered 2020-02-13: 1 mg via INTRAVENOUS
  Filled 2020-02-13: qty 1

## 2020-02-13 MED ORDER — HYDROMORPHONE HCL 2 MG PO TABS
1.0000 mg | ORAL_TABLET | ORAL | Status: DC | PRN
  Administered 2020-02-13 – 2020-02-14 (×4): 2 mg via ORAL
  Filled 2020-02-13 (×4): qty 1

## 2020-02-13 MED ORDER — GUAIFENESIN-DM 100-10 MG/5ML PO SYRP: 5.0000 mL | ORAL_SOLUTION | ORAL | Status: DC | PRN

## 2020-02-13 MED ORDER — KETOROLAC TROMETHAMINE 15 MG/ML IJ SOLN: 15.0000 mg | Freq: Four times a day (QID) | INTRAMUSCULAR | Status: DC

## 2020-02-13 MED ORDER — FLUCONAZOLE 150 MG PO TABS: 150.0000 mg | ORAL_TABLET | Freq: Every day | ORAL | Status: DC

## 2020-02-13 MED ORDER — DIPHENHYDRAMINE HCL 50 MG/ML IJ SOLN
50.0000 mg | INTRAMUSCULAR | Status: DC | PRN
  Administered 2020-02-13 – 2020-02-14 (×7): 50 mg via INTRAVENOUS
  Filled 2020-02-13 (×7): qty 1

## 2020-02-13 MED ORDER — FLUCONAZOLE 150 MG PO TABS
150.0000 mg | ORAL_TABLET | Freq: Once | ORAL | Status: AC
  Administered 2020-02-13: 150 mg via ORAL
  Filled 2020-02-13 (×2): qty 1

## 2020-02-13 NOTE — Progress Notes (Signed)
NAME:  [name], MRN:  [id number], DOB:  1971/05/27, LOS: 2 ADMISSION DATE:  02/10/2020, CONSULTATION DATE:  5/14 REFERRING MD:  [name], CHIEF COMPLAINT:  resp failure, cough   Brief History   Complex hx see below  [facility] pulm hx  Copied and pasted below background from Susan [name]' Nov 2020 puoml note 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   [facility] for management of cough variant asthma, subcentimeter lung nodules (stable) and chronic hypoxemia of unknown etiology.  She reports history of pulmonary fibrosis however on review of imaging no evidence of parenchymal disease on prior reports or recent chest imaging. Environmental exposures:  Hx chemotherapy  CareEverywhere Records Reviewed and Summarized: Pulm at [facility]      09/18/17 Bronchoscopy with BAL. Negative cultures and negative cytology.      Susan. Geraldo White documented on 01/31/18 that patient's chest imaging was presented in 10/2017 and her bronchial fibroelastotic scar was felt to be a nonspecific injury related to reflux or prior XRT and not related to her chronic cough. 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  recurrence. Currently has monthly port flushes.      The note also comments on PCP work-up comments on patient reporting hematuria with UA showing 1+ blood however this is not seen on recent PCP note.  IM at [facility] -       Per recent PCP note on  03/17/19, patient previously seen by Cardiology for CHF with right heart failure and pH with EF 35% however EF normalized on echo 11/2018   Imaging: CXR 04/12/06 - Normal CXR. No infiltrate, edema or effusion. CT 05/05/09 - Normal parenchymal. Scattered tiny pulmonary nodules with largest 26m in LUL likely benign CT Chest 09/05/17 (report only): Stable bilateral pulmonary nodules up to 448m(LUL 8m55mLUL 2 mm, RLL subpleural 4mm69mredemonstration of tiny right diaphragmatic hernia containing fat CT Chest 11/10/18 - RML 5 mm lung nodule, unchanged  PFT: 04/16/19 FVC 2.42 (65%) FEV1 2.03 (68%) Ratio 76  TLC 84% DLCO 75% Interpretation: Moderate reversible obstructive defect. Significant BD response in FEV1 - per MR 02/12/2020 - variable flow volume loop + .  CPET Per [facility] records  CPET 03/2016 showed functional impairment with VO2max54m% predicted. Normal oxygenation throughout on room air with post-exercise PaO2=125. HR 82% predicted. Abnormal FEV1 but ventilatory reserve remained. Conclusion: Cardiovascular limitation to exercise possibly reflecting deconditioning. Ventilatory impairment present but not exercise-limiting.  6 min walk  Per [facility] records (12/07/16): SpO2 started at 97-98% and decreased to ~94% with ambulation. There was one episode of symptomatic dizziness during which SpO2 was 88% but with questionable tracing. Recovered to high-90s with ~10-15 seconds of standing rest break.  [facility] 08/13/19  Unable to complete 6MWT. Patient had episode of coughing while walking and dizziness resulting in witnessed fall. Staff reported patient was disoriented and confused.  POC glucose was 109. Pulse oximetry >96% and tachycardia 110. No desaturations documented during this time.  Ambulatory  O2 Titration 04/13/19 - Patient unable to complete due to dizziness with ambulation. No desaturations documented. SpO2>95% in the time titration was attempted on room air. Per nursing, patient  anxious and holding breath during testing which likely contributed to early termination of test  Labs:  ANA Screen 07/03/17 - Positive  Feb 2020 ANA - neg SSA/SSB - neg Anti-DNA ab -neg MPO/PR3 ab - ng GBM ab - neg Anti-scleroderma ab - neg APLAS - neg ANCA titers - neg RF mildly elevated 18.6  Bronchoscopy: 11/19/18 Results for [name], [name] (MRN [id number]) as of 02/12/2020 18:03  Ref. Range 11/19/2018 11:41  Monocyte-Macrophage-Serous Fluid Latest Ref Range: 50 - 90 % 38 (L)  Fluid Type-FCT Unknown Bronch [place]  Color, Fluid Latest Ref Range: YELLOW  COLORLESS (A)  Total Nucleated Cell Count, Fluid Latest Ref Range: 0 - 1,000 cu mm 145  Lymphs, Fluid Latest Units: % 33  Eos, Fluid Latest Units: % 9  Appearance, Fluid Latest Ref Range: CLEAR  HAZY (A)  Neutrophil Count, Fluid Latest Ref Range: 0 - 25 % 20    Plan she was continued on Qvar and started on interleukin-5 receptor blockade Nucala in November 2020.  She was maintained on oxygen.   HPI - copied and pasted from [facility] MD NOTE   [name] is a 49 y.o. female with medical history significant for Chronic lymphedemea, migraine/headache on nurtec/topamax/Galcanezumab q 4wk injection, lymphedema, history of thyroid cancer, chest lymphoma, breast ductal carcinoma, Addison's disease, cervical cancer, superficial venous thrombosis in lower extremity and upper extremities previously on Lovenox now on aspirin 325 mg coming to the oncology clinic with multiple symptoms complaints. She reports she has "Rt sided chf-Adriamycin induced, chronic resp failure w/ hypoxia from bleomycin on lasix and 3-4 l [place] O2".  She C/o headache for x few days on left parietal side, c/o dry mouth but swollen arms. She also complains of trouble breathing, blurred vision, headache fatigue, arm swelling. She was dizzy at [facility] few days ago, and  has generalized weakness and reports she almost passed out twice in last 5 days. She Called neuro  office at [facility] for headache and reports they are planning for pain management- botox inj in 2 wks. She was seen in Oncology clinic and sent to ED for iv hydration but looked wet and dry- mouth and was told she is "third spacing" but " intravascularly dry" and was given NS bolus for hydration. She underwent extensive work up In ED, VS STABLE, cbc. CMP Normal, CT head normal,  CXR "okay", CTA- bronchiolitis features.MR MRV brain - normal no intracranial venous thrombosis. TSH low  0.06 but improving from before 0.016 yr ago. FT4/FT3 sent, Cortisol ordered.  She also received fentanyl 25 mcg x2, benadryl 12.5 mg iv, ativan 1 mg iv. ED  discussed with oncology and advised admission for " fine tuning"  She also c/o cough with brownish sputum.  She otherwise denies any fever, chills, chest pain, shortness of breath, abdominal pain, focal weakness.  She has chronic nausea takes IV Benadryl injection at home, reports she is allergic to most of the nausea medication.  She wants to try Toradol for the headache.    EVENTS Since admit - entered by CCM MD   5/14 -she reports baseline 3 L hypoxemia.  This has been ongoing since December 2016.  She reports prior to admission her cough got worse than her baseline cough.  She feels that Susan. Cordelia Pen asthma treatment including interleukin-5 receptor blockade  has helped her asthma.  However the cough worsened prior to the admission and despite nebulizers and steroids the cough is worsened.  She had a CT angiogram of the chest that ruled out pulmonary embolism but upon my personal and visualization I agree with the most like attenuation findings.  Not sure if it is groundglass opacities.  Because of the persistence of cough pulmonary has been consulted.    Past Medical History     has a past medical history of Addison's disease ([place]), Adrenal insufficiency ([place]), Anemia, Anxiety, Aortic stenosis, Aortic stenosis, Appendicitis (12/19/09), Appendicitis, Breast cancer  ([place]), Cellulitis of right middle finger (11/07/2018), Cervical cancer ([place]) (12/23/2018), Chest pain, CHF with right heart failure ([place]) (04/17/2017), Chronic respiratory failure with hypoxia ([place]) (12/23/2018), Cough variant asthma (04/13/2019), Depression, GERD (gastroesophageal reflux disease), Headache, Heart murmur, History of kidney stones, Hodgkin lymphoma ([place]), Hodgkin's lymphoma ([place]), Hypertension, Hypoxia, Necrotizing fasciitis ([place]) (12/23/2018), Non-ischemic cardiomyopathy ([place]), Osteoporosis, Palpitations, Pituitary adenoma ([place]) (12/23/2018), Pneumonia, PONV (postoperative nausea and vomiting), Pre-diabetes, Pulmonary hypertension ([place]) (12/23/2018), Raynaud phenomenon, Right heart failure ([place]) (04/17/2017), Supplemental oxygen dependent, SVT (supraventricular tachycardia) ([place]), Tachycardia, and Thyroid cancer ([place]).   reports that she has never smoked. She has never used smokeless tobacco.  Past Surgical History:  Procedure Laterality Date  . ABDOMINAL HYSTERECTOMY    . AMPUTATION Left 01/30/2019   Procedure: Left Index finger amputation with flap reconstruction and repair reconstruction;  Surgeon: Roseanne Kaufman, MD;  Location: [place];  Service: Orthopedics;  Laterality: Left;  . APPENDECTOMY    . CARDIAC CATHETERIZATION  05/18/09   NORMAL CATH  . I & D EXTREMITY Left 12/23/2018   Procedure: IRRIGATION AND DEBRIDEMENT HAND / INDEX FINGER;  Surgeon: Roseanne Kaufman, MD;  Location: [place];  Service: Orthopedics;  Laterality: Left;  Marland Kitchen MASTECTOMY    . PITUITARY SURGERY    . RIGHT/LEFT HEART CATH AND CORONARY ANGIOGRAPHY N/A 04/02/2018   Procedure: RIGHT/LEFT HEART CATH AND CORONARY ANGIOGRAPHY;  Surgeon: Burnell Blanks, MD;  Location: [place] [facility];  Service: Cardiovascular;  Laterality: N/A;  . TOTAL THYROIDECTOMY    . VIDEO BRONCHOSCOPY Bilateral 11/14/2018   Procedure: VIDEO BRONCHOSCOPY WITHOUT FLUORO;  Surgeon: Margaretha Seeds, MD;  Location: [place];  Service:  Cardiopulmonary;  Laterality: Bilateral;  . VIDEO BRONCHOSCOPY WITH ENDOBRONCHIAL ULTRASOUND N/A 11/19/2018   Procedure: VIDEO BRONCHOSCOPY WITH ENDOBRONCHIAL ULTRASOUND;  Surgeon: Margaretha Seeds, MD;  Location: [facility] OR;  Service: Thoracic;  Laterality: N/A;    Allergies  Allergen Reactions  . Mushroom Extract Complex Anaphylaxis  . Na Ferric Gluc Cplx In Sucrose Anaphylaxis  . Cymbalta [Duloxetine Hcl] Swelling and Anxiety  . Succinylcholine Other (See Comments)    Lock Jaw  . Buprenorphine Hcl Hives  . Compazine Other (See Comments)    Altered mental status Aggression  . Metoclopramide Other (See Comments)    Dystonia  . Morphine And Related Hives  . Ondansetron Hives and Rash  . Promethazine Hcl Hives  . Tegaderm Ag Mesh [Silver] Rash    Old formulation only, is able to tolerate new formulation    Immunization History  Administered Date(s) Administered  . Influenza, High Dose Seasonal PF 06/19/2019  . Influenza, Quadrivalent, Recombinant, Inj, Pf 08/13/2018  . Influenza, Seasonal, Injecte, Preservative Fre 06/23/2016, 11/12/2017, 08/13/2018  . Influenza,inj,Quad PF,6+ Mos 07/04/2015, 07/04/2015, 06/23/2016, 07/03/2017  . Influenza,inj,Quad PF,6-35 Mos 07/04/2015  . Influenza-Unspecified 07/23/2007, 07/06/2008, 06/13/2009, 07/06/2010, 07/25/2011, 08/01/2012, 07/14/2013, 07/04/2015, 07/04/2015, 06/23/2016, 06/23/2016, 07/03/2017, 11/12/2017, 06/19/2019  . Pneumococcal Conjugate-13 03/20/2019  .  Pneumococcal Polysaccharide-23 02/21/2017  . Pneumococcal-Unspecified 02/21/2017  . Td 12/19/2010  . Varicella 04/02/2007    Family History  Family history unknown: Yes     Current Facility-Administered Medications:  .  acetaminophen (TYLENOL) tablet 650 mg, 650 mg, Oral, Q6H PRN **OR** acetaminophen (TYLENOL) suppository 650 mg, 650 mg, Rectal, Q6H PRN, Kc, Ramesh, MD .  albuterol (PROVENTIL) (2.5 MG/3ML) 0.083% nebulizer solution 2.5 mg, 2.5 mg, Nebulization, Q2H PRN, Kc,  Ramesh, MD, 2.5 mg at 02/12/20 1106 .  aspirin tablet 325 mg, 325 mg, Oral, QHS, Kc, Ramesh, MD, 325 mg at 02/12/20 2133 .  benzonatate (TESSALON) capsule 200 mg, 200 mg, Oral, TID PRN, Kc, Ramesh, MD .  budesonide (PULMICORT) nebulizer solution 0.25 mg, 0.25 mg, Nebulization, BID, Kc, Ramesh, MD, 0.25 mg at 02/13/20 0753 .  cefTRIAXone (ROCEPHIN) 1 g in sodium chloride 0.9 % 100 mL IVPB, 1 g, Intravenous, Q24H, Kyle, Tyrone A, DO, Last Rate: 200 mL/hr at 02/13/20 1212, 1 g at 02/13/20 1212 .  Chlorhexidine Gluconate Cloth 2 % PADS 6 each, 6 each, Topical, Daily, Kyle, Tyrone A, DO, 6 each at 02/13/20 1011 .  clonazePAM (KLONOPIN) tablet 1 mg, 1 mg, Oral, BID, Kc, Ramesh, MD, 1 mg at 02/13/20 1007 .  colchicine tablet 0.6 mg, 0.6 mg, Oral, Q1500, Kc, Ramesh, MD, 0.6 mg at 02/12/20 1550 .  diphenhydrAMINE (BENADRYL) injection 50 mg, 50 mg, Intravenous, Q4H PRN, Kyle, Tyrone A, DO, 50 mg at 02/13/20 1507 .  dronabinol (MARINOL) capsule 2.5 mg, 2.5 mg, Oral, BID AC, Kyle, Tyrone A, DO, 2.5 mg at 02/13/20 1748 .  escitalopram (LEXAPRO) tablet 20 mg, 20 mg, Oral, QPM, Kc, Ramesh, MD, 20 mg at 02/13/20 1748 .  estradiol (ESTRACE) tablet 2 mg, 2 mg, Oral, QPM, Kc, Ramesh, MD, 2 mg at 02/13/20 1748 .  fluticasone furoate-vilanterol (BREO ELLIPTA) 200-25 MCG/INH 1 puff, 1 puff, Inhalation, Daily, Kc, Ramesh, MD, 1 puff at 02/13/20 0753 .  furosemide (LASIX) injection 20 mg, 20 mg, Intravenous, BID, Kc, Ramesh, MD, 20 mg at 02/13/20 1748 .  gi cocktail (Maalox,Lidocaine,Donnatal), 30 mL, Oral, Daily PRN, Kc, Ramesh, MD .  guaiFENesin (MUCINEX) 12 hr tablet 600 mg, 600 mg, Oral, BID, Kyle, Tyrone A, DO, 600 mg at 02/13/20 1008 .  guaiFENesin-dextromethorphan (ROBITUSSIN DM) 100-10 MG/5ML syrup 5 mL, 5 mL, Oral, Q4H PRN, Marylyn Ishihara, Tyrone A, DO .  hydrocortisone sodium succinate (SOLU-CORTEF) 100 MG injection 50 mg, 50 mg, Intravenous, Q8H, Kyle, Tyrone A, DO, 50 mg at 02/13/20 1508 .  HYDROmorphone (DILAUDID)  tablet 1-2 mg, 1-2 mg, Oral, Q6H PRN, Opyd, Ilene Qua, MD, 2 mg at 02/13/20 1748 .  levothyroxine (SYNTHROID) tablet 112 mcg, 112 mcg, Oral, Q0600, Antonieta Pert, MD, 112 mcg at 02/13/20 0609 .  LORazepam (ATIVAN) tablet 0.5-1 mg, 0.5-1 mg, Oral, BID PRN, Lupita Leash, Ramesh, MD, 1 mg at 02/12/20 0437 .  metoprolol succinate (TOPROL-XL) 24 hr tablet 37.5 mg, 37.5 mg, Oral, Daily, Kc, Ramesh, MD, 37.5 mg at 02/13/20 0608 .  rosuvastatin (CRESTOR) tablet 10 mg, 10 mg, Oral, Daily, Kc, Ramesh, MD, 10 mg at 02/13/20 1007 .  sucralfate (CARAFATE) tablet 1 g, 1 g, Oral, Daily PRN, Kc, Ramesh, MD .  topiramate (TOPAMAX) tablet 150 mg, 150 mg, Oral, QHS, Kc, Ramesh, MD, 150 mg at 02/12/20 2134 .  zolpidem (AMBIEN) tablet 5 mg, 5 mg, Oral, QHS PRN, Opyd, Ilene Qua, MD   Significant Hospital Events   02/10/2020  Admit 5/14 - ccm conslt. onc consult  Consults:  Pulmonary  Procedures:     Significant Diagnostic Tests:  HRCT chest 5/15-few areas of linear scar, subcentimeter nodules.  No significant interlobular or intralobular septal thickening, architectural distortion, or groundglass.  Right chest port.  CTA 5/12-mild groundglass opacities throughout, enlarged pulmonary veins.  Echocardiogram 5/15-LVEF 65 to 95%, normal diastolic function.  Normal RV function, size, pressure.  Linear density seen in right atrium due to poor.  Mild MR, moderate TR.  Micro Data:  covid negative Urine strep 5/14 negative Urine legionella 5/14 rvp 5/14 negative 5/14 sputum-rare PMN, rare GPC, rare gram variable rods, few budding yeast  Antimicrobials:  Ceftriaxone azithro   Objective   Blood pressure 115/78, pulse 88, temperature (!) 97.4 F (36.3 C), temperature source Oral, resp. rate 19, height _0  (1.651 m), weight 87.2 kg, SpO2 100 %.        Intake/Output Summary (Last 24 hours) at 02/13/2020 1825 Last data filed at 02/13/2020 1026 Gross per 24 hour  Intake 320.36 ml  Output 1300 ml  Net -979.64 ml    Filed Weights   02/10/20 2315  Weight: 87.2 kg    Examination:  General: Middle-aged woman sitting up in bed in no acute distress HENT: [place]/AT, eyes anicteric Lungs: Frequent dry coughing, but no wheezing or rales.  No tachypnea, speaking in full sentences. Cardiovascular: Regular rate and rhythm Abdomen: Nondistended Extremities: Edema in all extremities.  Amputated second digit on the left hand. Neuro: Alert and oriented, answering questions appropriately, moving all extremities spontaneously. Derm: No bruising or ecchymosis, no rashes  Resolved Hospital Problem list     Assessment & Plan:  Chronic cough worse History of asthma -on interleukin-5 receptor blockade treatment History of chronic hypoxemia -unclear etiology.  HRCT chest without acute abnormality or fibrosis.  Echocardiogram without obvious abnormalities to suggest acute pulmonary edema. Anasarca- No proteinuria to suggest nephrotic syndrome. Normal free T4, suppressed TSH  Plan  -Urine Legionella pending -Continue following respiratory culture -Agree with need for outpatient PFTs.  Bronchiolitis would be a diagnosis easier to make on PFTs and with imaging.  Upper airway abnormalities would also be easier to evaluate at this point. -Immunoglobulins and allergy panel pending -Continue bronchodilators and steroids -Continue Nucala injections after discharge -Continue antibiotics to complete 7-day course -Rechecking ANCA given edema, recent mild AKI.  May be required to be rechecked when off steroids.  -May need outpatient ABG to evaluate AA gradient to better understand chronic respiratory failure. -Titrate down supplemental oxygen as able.   Best practice:  Per primary    LABS    PULMONARY No results for input(s): PHART, PCO2ART, PO2ART, HCO3, TCO2, O2SAT in the last 168 hours.  Invalid input(s): PCO2, PO2  CBC Recent Labs  Lab 02/11/20 0530 02/12/20 0805 02/13/20 0611  HGB 11.2* 10.9* 10.7*   HCT 35.6* 35.1* 34.9*  WBC 5.7 8.6 8.2  PLT 269 265 271    COAGULATION No results for input(s): INR in the last 168 hours.  CARDIAC  No results for input(s): TROPONINI in the last 168 hours. No results for input(s): PROBNP in the last 168 hours.   CHEMISTRY Recent Labs  Lab 02/10/20 1020 02/10/20 1020 02/11/20 0530 02/11/20 0530 02/12/20 0805 02/13/20 0611  NA 141  --  141  --  141 139  K 3.4*   < > 3.9   < > 3.6 3.6  CL 106  --  104  --  108 108  CO2 24  --  26  --  26 25  GLUCOSE 98  --  103*  --  107* 110*  BUN 15  --  16  --  15 14  CREATININE 0.99  --  1.03*  --  0.90 0.91  CALCIUM 8.4*  --  8.5*  --  8.6* 9.0  MG  --   --   --   --  2.0 2.0  PHOS  --   --   --   --  2.6 3.7   < > = values in this interval not displayed.   Estimated Creatinine Clearance: 82.5 mL/min (by C-G formula based on SCr of 0.91 mg/dL).   LIVER Recent Labs  Lab 02/10/20 1020 02/11/20 0530 02/12/20 0805 02/13/20 0611  AST 36 35  --   --   ALT 43 36  --   --   ALKPHOS 59 47  --   --   BILITOT 0.4 0.6  --   --   PROT 7.3 6.5  --   --   ALBUMIN 3.7 3.5 3.5 3.7     INFECTIOUS Recent Labs  Lab 02/13/20 0611  LATICACIDVEN 1.9  PROCALCITON <0.10     ENDOCRINE CBG (last 3)  No results for input(s): GLUCAP in the last 72 hours.       IMAGING x48h  - image(s) personally visualized  -   highlighted in bold MR STERNUM W WO CONTRAST  Result Date: 02/11/2020 CLINICAL DATA:  History of lymphoma question of sternal lesion EXAM: MR STERNUM WITHOUT AND WITH CONTRAST TECHNIQUE: Multiplanar MR images of the sternum were obtained within the. CONTRAST:  91m GADAVIST GADOBUTROL 1 MMOL/ML IV SOLN COMPARISON:  CT chest Feb 10, 2020 FINDINGS: Musculoskeletal: Normal marrow signal seen throughout the visualized osseous structures and sternum. No osseous fracture, pathologic marrow infiltration or marrow edema. No abnormal enhancement seen throughout. The sternomanubrial joints are intact.  Soft tissues: Bilateral breast prosthesis are seen with findings suggestive of internal capsule rupture. A right-sided MediPort catheter is noted. No areas of abnormal enhancement along the chest wall. IMPRESSION: No evidence metastatic disease involving the chest wall. Electronically Signed   By: BPrudencio PairM.D.   On: 02/11/2020 22:19   ECHOCARDIOGRAM COMPLETE  Result Date: 02/13/2020    ECHOCARDIOGRAM REPORT   Patient Name:   Susan DWORKINDate of Exam: 02/13/2020 Medical Rec #:  [id number]     Height:       65.0 in Accession #:    [id number]    Weight:       192.2 lb Date of Birth:  [birth year]     BSA:          1.945 m Patient Age:    89years       BP:           119/88 mmHg Patient Gender: F              HR:           84 bpm. Exam Location:  Inpatient Procedure: 2D Echo Indications:    Acute Respiratory Insufficiency R06.89  History:        Patient has prior history of Echocardiogram examinations, most                 recent 11/09/2018. CHF, Pulmonary HTN; Risk Factors:Hypertension.  Sonographer:    CMikki SanteeRDCS (AE) Referring Phys: [id number] [facility] Sonographer Comments: Image acquisition challenging due to breast implants. IMPRESSIONS  1. Images are somewhat limited due to  acoustic windows.  2. Left ventricular ejection fraction, by estimation, is 65 to 70%. The left ventricle has normal function. The left ventricle has no regional wall motion abnormalities. Left ventricular diastolic parameters were normal.  3. Right ventricular systolic function is normal. The right ventricular size is normal. There is normal pulmonary artery systolic pressure. The estimated right ventricular systolic pressure is 02.4 mmHg.  4. The mitral valve is grossly normal. Mild mitral valve regurgitation.  5. Tricuspid valve regurgitation is moderate.  6. Mobile and relatively linear echodensity seen in right atrium, consistent with catheter in most views. If there is any concern for clinical endocarditis, would  suggest TEE.  7. The aortic valve is tricuspid, moderately calcified. Aortic valve regurgitation is mild.  8. The inferior vena cava is dilated in size with >50% respiratory variability, suggesting right atrial pressure of 8 mmHg. FINDINGS  Left Ventricle: Left ventricular ejection fraction, by estimation, is 65 to 70%. The left ventricle has normal function. The left ventricle has no regional wall motion abnormalities. The left ventricular internal cavity size was normal in size. There is  no left ventricular hypertrophy. Left ventricular diastolic parameters were normal. Right Ventricle: The right ventricular size is normal. No increase in right ventricular wall thickness. Right ventricular systolic function is normal. There is normal pulmonary artery systolic pressure. The tricuspid regurgitant velocity is 2.63 m/s, and  with an assumed right atrial pressure of 8 mmHg, the estimated right ventricular systolic pressure is 09.7 mmHg. Left Atrium: Left atrial size was normal in size. Right Atrium: Right atrial size was normal in size. Pericardium: There is no evidence of pericardial effusion. Mitral Valve: The mitral valve is grossly normal. Mild mitral annular calcification. Mild mitral valve regurgitation. Tricuspid Valve: The tricuspid valve is grossly normal. Tricuspid valve regurgitation is moderate. Aortic Valve: The aortic valve is tricuspid. Aortic valve regurgitation is mild. Aortic regurgitation PHT measures 348 msec. There is moderate calcification of the aortic valve. Aortic valve mean gradient measures 9.7 mmHg. Aortic valve peak gradient measures 18.3 mmHg. Aortic valve area, by VTI measures 1.01 cm. Pulmonic Valve: The pulmonic valve was not well visualized. Pulmonic valve regurgitation is trivial. Aorta: The aortic root is normal in size and structure. Venous: The inferior vena cava is dilated in size with greater than 50% respiratory variability, suggesting right atrial pressure of 8 mmHg.  IAS/Shunts: No atrial level shunt detected by color flow Doppler.  LEFT VENTRICLE PLAX 2D LVIDd:         3.90 cm  Diastology LVIDs:         2.80 cm  LV e' lateral:   10.90 cm/s LV PW:         0.80 cm  LV E/e' lateral: 11.6 LV IVS:        0.70 cm  LV e' medial:    6.74 cm/s LVOT diam:     1.80 cm  LV E/e' medial:  18.7 LV SV:         46 LV SV Index:   23 LVOT Area:     2.54 cm  RIGHT VENTRICLE RV S prime:     12.10 cm/s TAPSE (M-mode): 2.0 cm LEFT ATRIUM             Index       RIGHT ATRIUM          Index LA diam:        3.20 cm 1.65 cm/m  RA Area:     9.99 cm LA  Vol [facility]):   20.9 ml 10.74 ml/m RA Volume:   18.40 ml 9.46 ml/m LA Vol (A4C):   25.0 ml 12.85 ml/m LA Biplane Vol: 23.9 ml 12.29 ml/m  AORTIC VALVE AV Area (Vmax):    1.02 cm AV Area (Vmean):   0.99 cm AV Area (VTI):     1.01 cm AV Vmax:           214.00 cm/s AV Vmean:          146.333 cm/s AV VTI:            0.452 m AV Peak Grad:      18.3 mmHg AV Mean Grad:      9.7 mmHg LVOT Vmax:         85.80 cm/s LVOT Vmean:        57.100 cm/s LVOT VTI:          0.179 m LVOT/AV VTI ratio: 0.40 AI PHT:            348 msec  AORTA Ao Root diam: 2.50 cm MITRAL VALVE                TRICUSPID VALVE MV Area (PHT): 4.96 cm     TR Peak grad:   27.7 mmHg MV Decel Time: 153 msec     TR Vmax:        263.00 cm/s MV E velocity: 126.00 cm/s MV A velocity: 124.00 cm/s  SHUNTS MV E/A ratio:  1.02         Systemic VTI:  0.18 m                             Systemic Diam: 1.80 cm Rozann Lesches MD Electronically signed by Rozann Lesches MD Signature Date/Time: 02/13/2020/12:59:21 PM    Final    VAS [place] UPPER EXTREMITY VENOUS DUPLEX  Result Date: 02/12/2020 UPPER VENOUS STUDY  Indications: Edema Comparison Study: 11/16/2019-negative right upper extremity venous duplex Performing Technologist: Maudry Mayhew MHA, RDMS, RVT, RDCS  Examination Guidelines: A complete evaluation includes B-mode imaging, spectral Doppler, color Doppler, and power Doppler as needed of all  accessible portions of each vessel. Bilateral testing is considered an integral part of a complete examination. Limited examinations for reoccurring indications may be performed as noted.  Right Findings: +----------+------------+---------+-----------+----------+-------+ RIGHT     CompressiblePhasicitySpontaneousPropertiesSummary +----------+------------+---------+-----------+----------+-------+ IJV           Full       Yes       Yes                      +----------+------------+---------+-----------+----------+-------+ Subclavian    Full       Yes       Yes                      +----------+------------+---------+-----------+----------+-------+ Axillary      Full       Yes       Yes                      +----------+------------+---------+-----------+----------+-------+ Brachial      Full       Yes       Yes                      +----------+------------+---------+-----------+----------+-------+ Radial        Full                                          +----------+------------+---------+-----------+----------+-------+  Ulnar         Full                                          +----------+------------+---------+-----------+----------+-------+ Cephalic      Full                                          +----------+------------+---------+-----------+----------+-------+ Basilic       Full                                          +----------+------------+---------+-----------+----------+-------+  Left Findings: +----------+------------+---------+-----------+----------+--------------+ LEFT      CompressiblePhasicitySpontaneousProperties   Summary     +----------+------------+---------+-----------+----------+--------------+ IJV           Full       Yes       Yes                             +----------+------------+---------+-----------+----------+--------------+ Subclavian    Full       Yes       Yes                              +----------+------------+---------+-----------+----------+--------------+ Axillary      Full       Yes       Yes                             +----------+------------+---------+-----------+----------+--------------+ Brachial      Full       Yes       Yes                             +----------+------------+---------+-----------+----------+--------------+ Radial        Full                                                 +----------+------------+---------+-----------+----------+--------------+ Ulnar         Full                                                 +----------+------------+---------+-----------+----------+--------------+ Cephalic      Full                                                 +----------+------------+---------+-----------+----------+--------------+ Basilic                                             Not visualized +----------+------------+---------+-----------+----------+--------------+  Summary:  Right: No  evidence of deep vein thrombosis in the upper extremity. No evidence of superficial vein thrombosis in the upper extremity.  Left: No evidence of deep vein thrombosis in the upper extremity. No evidence of superficial vein thrombosis in the upper extremity.  *See table(s) above for measurements and observations.    Preliminary

## 2020-02-13 NOTE — Progress Notes (Signed)
[name] Kitchen  PROGRESS NOTE    [name]  RKY:[id number] DOB: 04-Mar-1971 DOA: 02/10/2020 PCP: Su Grand, MD   Brief Narrative:   [name] a 49 y.o.femalewith medical history significant forChronic lymphedemea, migraine/headacheon nurtec/topamax/Galcanezumab q 4wk injection,lymphedema,history of thyroid cancer, chest lymphoma, breast ductal carcinoma, Addison's disease, cervical cancer, superficial venous thrombosis in lower extremity and upper extremities previously on Lovenox now on aspirin325 mgcoming to the oncology clinic with multiple symptoms complaints. She reports she has "Rt sided chf-Adriamycin induced, chronicresp failure w/ hypoxia from bleomycin on lasix and 3-4 l [place] O2".  SheC/o headache for xfewdays on left parietal side, c/o dry mouth but swollen arms. She also complains oftrouble breathing, blurred vision,headachefatigue, arm swelling.She was dizzy at [facility] days ago,and hasgeneralized weakness and reports shealmost passed out twice in last 5 days. She Called neuro office at [facility] headache and reports they areplanning for pain management- botox inj in 2 wks. She was seen in Oncology clinic and sent to ED for iv hydration but looked wet and dry- mouthand was told she is "third spacing" but "intravascularly dry" and was given NSbolus for hydration.She underwent extensive work up In ED,VSSTABLE, cbc. CMP Normal, CT head normal, CXR "okay", CTA- bronchiolitis features.MR MRV brain - normal no intracranial venous thrombosis. TSH low 0.06 but improving from before 0.016 yr ago. FT4/FT3 sent, Cortisol ordered. She also received fentanyl 25 mcg x2, benadryl 12.5 mg iv, ativan 1 mg iv. ED discussed with oncology and advised admission for " fine tuning" She also c/ocough with brownish sputum.She otherwise denies any fever, chills, chest pain, shortness of breath, abdominal pain, focal weakness. She has chronic nausea takes IV Benadryl injection at  home, reports she is allergic to most of the nausea medication. She wants to try Toradol for the headache.  5/13: C/o continued cough today and edema. She is tight and course on exam. Check sputum Cx, change abx to rocephin/zithro, increase steroids. Spoke with onco. Will get contrasted MRI of sternum. She reports low UOP. Get bladder scan and follows I&Os.  5/14: MRI sternum does not reveal any CA. It did show ruptured breast implants. I reviewed with plastic surgery. This can be followed up outpatient. Her respiratory status is not improved. She is still tight w/ terrible cough and is now producing sputum that is blood-tinged. I have already increased her cortef to 21m TID and she is on nebs and abx. I have spoken with pulm. They will take a look. No good reason for her lymphedema. Checking BUE dopplers, but I don't think I'll find a clot. She's starting to move fluid with lasix now. Will continue, but without evidence of HF or CKD; this isn't the long term answer.   5/15: Her BUE edema is improved. She is moving fluid: 2.8L down since admission. We will look to transition her to lasix 459mPO BID tomorrow. HRCT chest pending results. Echo performed: EF 6531-51%diastolic fxn normal. RVP is negaitve. Sputum negative. Her pulm exam seems better this morning with more air movement at the bases, but still UAT/tight cough superiorly. Continuing increased steroids, nebs, guaifenesin, and abx (zithro ends today). I think we can look to d/c probably tomorrow w/ nebs and PO abx/lasix w/ outpt follow up.    Assessment & Plan:   Principal Problem:   Headache Active Problems:   Generalized weakness  Left-sided headache w/ history of migraine - on nurtec/topamax/Galcanezumab q 4wk injection at home. - Home medications resumed, added IV Toradol as needed -  She has a follow-up with [facility] neurology/pain clinic for Botox trial for her headache. CT head MRV reviewed no acute finding. -  Nonfocal on exam. - 5/13: she is not focused on HA this AM, continue to monitor     - 5/15: HA exacerbated by cough. Let's control cough a little better.  Generalized weakness/dizziness - orthostatic blood pressures pending - 5/13: PT eval: HHPT - random cortisol is 5.9; check AM cortisol     - 5/14: AM cortisol is up. She's on stress dose cortef. Follow.   Nausea - continue benadryl, ativan - 5/13: will see if marinol will help     - 5/14: N improved with marinol     - 5/15: says nausea worsened ON, but she also had diluadid as well; be cautious with narcotic use  Upper extremity lymphedema - worsening edema history of right-sided heart failure - CXR and CTA w/ no features of CHF or PE.  - placed on lasix 5m IV - 5/13: she reports poor UOP -- nothing is documented - SCr is stable. BNP is 90.     - 5/14: BUE dopplers ordered. Now moving fluid with lasix. Monitor.      - 5/15: BUE improving; BUE dopplers negative  Cough Sputum production - CTA shows bronchiolitis - 5/13: has chronically been on doxy; will switch her to rocephin/zithro - continue qvar inhaler, albuterol inhaler, and nebs - sputum Cx ordered - she sounds tight and course on exam; increase steroids     - 5/14: still tight and with terrible cough; producing some blood-tinged sputum; have spoken with PCCM. They will take a look.      - 5/15: appreciate PCCM assistance; awaiting HRCT results, echo results noted; sputum Cx neg, continuing abx, nebs, steroids  Hypokalemia - resolved  Chronic hypoxic respiratory failure Chronic right-sided CHF - Continue home oxygen 3 to 4 L nasal cannula. - 5/13: BNP is 90; CTA w/o cardiomegaly or pulm edema - now on lasix 20 mg IV BID - on 3L [place] during exam  Hx of thyroid cancer - TSH low, patient reports chronically low. - 5/13: T4 is ok; T3 is pending. Continue  synthroid - follow up with endocrine   Chest lymphoma - Patient is scheduled to get MRI sternum by Dr. GLindi Adie - 5/13: spoke with dr. GLindi Adiethis afternoon. Pt has a history of multiple CA and still not sure what is her root problem. They have requested that we get the MRI of the sternum as a contrasted study while she is here. We will order this exam. Appreciate their assistance. They will be following during her stay.      - 5/14: MRI neg for CA, no further onco recs. Appreciate assistance.   Addison's disease - No features suggestive of Addison's crisis - orthostatic blood pressure pending  - 5/13: random cortisol was 5.9; check an AM cortisol - she is on her home dose of cortef; may increase that to stress dosing     - 5/14: see above  Hx of venous thrombosis in lower extremity and upper extremities - previously on Lovenox now on aspirin 325 mg; continue ASA  Anxiety Depression - continue her Klonopin, Lexapro  Ruptured breast implant     - spoke with plastic surgery; can be followed up outpt  DVT prophylaxis: SCDs Code Status: FULL Family Communication: With husband at bedside.   Status is: Inpatient  Remains inpatient appropriate because:IV treatments appropriate due to intensity of illness or inability to take  PO   Dispo: The patient is from: Home              Anticipated d/c is to: Home              Anticipated d/c date is: 1 day              Patient currently is not medically stable to d/c.  Consultants:   PCCM  Heme/Onc  Antimicrobials:  . Rocephin, azithromycin   ROS:  Reports cough, N, HA . Remainder 10-pt ROS is negative for all not previously mentioned.  Subjective: "It got worse last night."  Objective: Vitals:   02/12/20 2224 02/13/20 0637 02/13/20 0753 02/13/20 1416  BP: (!) 154/74 119/88  115/78  Pulse: 86 86 84 88  Resp: _0 Temp: 97.8 F (36.6 C) 97.7 F (36.5 C)  (!) 97.4 F (36.3 C)   TempSrc: Oral Oral  Oral  SpO2: 100% 100% 100% 100%  Weight:      Height:        Intake/Output Summary (Last 24 hours) at 02/13/2020 1500 Last data filed at 02/13/2020 1026 Gross per 24 hour  Intake 1060.57 ml  Output 1900 ml  Net -839.43 ml   Filed Weights   02/10/20 2315  Weight: 87.2 kg    Examination:  General: 48 y.o. female resting in bed in NAD Cardiovascular: RRR, +S1, S2, no m/g/r, equal pulses throughout Respiratory: improved air movement at bases. Her course sounds are more superior and also involve UAT transmission. She is able to carry long conversation without cough; so this is an improvement GI: BS+, NDNT, no masses noted, no organomegaly noted MSK: No c/c; BUE edema is improving 1-2+ Neuro: A&O x 3, no focal deficits Psyc: Appropriate interaction and affect, calm/cooperative   Data Reviewed: I have personally reviewed following labs and imaging studies.  CBC: Recent Labs  Lab 02/10/20 1020 02/11/20 0530 02/12/20 0805 02/13/20 0611  WBC 7.1 5.7 8.6 8.2  NEUTROABS 3.7  --  5.4 5.3  HGB 12.7 11.2* 10.9* 10.7*  HCT 39.3 35.6* 35.1* 34.9*  MCV 91.4 93.0 94.1 94.8  PLT 297 269 265 001   Basic Metabolic Panel: Recent Labs  Lab 02/10/20 1020 02/11/20 0530 02/12/20 0805 02/13/20 0611  NA 141 141 141 139  K 3.4* 3.9 3.6 3.6  CL 106 104 108 108  CO2 _1 GLUCOSE 98 103* 107* 110*  BUN _2 CREATININE 0.99 1.03* 0.90 0.91  CALCIUM 8.4* 8.5* 8.6* 9.0  MG  --   --  2.0 2.0  PHOS  --   --  2.6 3.7   GFR: Estimated Creatinine Clearance: 82.5 mL/min (by C-G formula based on SCr of 0.91 mg/dL). Liver Function Tests: Recent Labs  Lab 02/10/20 1020 02/11/20 0530 02/12/20 0805 02/13/20 0611  AST 36 35  --   --   ALT 43 36  --   --   ALKPHOS 59 47  --   --   BILITOT 0.4 0.6  --   --   PROT 7.3 6.5  --   --   ALBUMIN 3.7 3.5 3.5 3.7   No results for input(s): LIPASE, AMYLASE in the last 168 hours. No results for input(s):  AMMONIA in the last 168 hours. Coagulation Profile: No results for input(s): INR, PROTIME in the last 168 hours. Cardiac Enzymes: No results for input(s): CKTOTAL, CKMB, CKMBINDEX, TROPONINI in the last 168 hours. BNP (last  3 results) No results for input(s): PROBNP in the last 8760 hours. HbA1C: No results for input(s): HGBA1C in the last 72 hours. CBG: No results for input(s): GLUCAP in the last 168 hours. Lipid Profile: No results for input(s): CHOL, HDL, LDLCALC, TRIG, CHOLHDL, LDLDIRECT in the last 72 hours. Thyroid Function Tests: Recent Labs    02/11/20 0530  T3FREE 2.6   Anemia Panel: No results for input(s): VITAMINB12, FOLATE, FERRITIN, TIBC, IRON, RETICCTPCT in the last 72 hours. Sepsis Labs: Recent Labs  Lab 02/13/20 0611  PROCALCITON <0.10  LATICACIDVEN 1.9    Recent Results (from the past 240 hour(s))  SARS Coronavirus 2 by RT PCR (hospital order, performed in [facility] hospital lab) Nasopharyngeal Nasopharyngeal Swab     Status: None   Collection Time: 02/10/20  8:47 PM   Specimen: Nasopharyngeal Swab  Result Value Ref Range Status   SARS Coronavirus 2 NEGATIVE NEGATIVE Final    Comment: (NOTE) SARS-CoV-2 target nucleic acids are NOT DETECTED. The SARS-CoV-2 RNA is generally detectable in upper and lower respiratory specimens during the acute phase of infection. The lowest concentration of SARS-CoV-2 viral copies this assay can detect is 250 copies / mL. A negative result does not preclude SARS-CoV-2 infection and should not be used as the sole basis for treatment or other patient management decisions.  A negative result may occur with improper specimen collection / handling, submission of specimen other than nasopharyngeal swab, presence of viral mutation(s) within the areas targeted by this assay, and inadequate number of viral copies (<250 copies / mL). A negative result must be combined with clinical observations, patient history, and  epidemiological information. Fact Sheet for Patients:   StrictlyIdeas.no Fact Sheet for Healthcare Providers: BankingDealers.co.za This test is not yet approved or cleared  by the [place] FDA and has been authorized for detection and/or diagnosis of SARS-CoV-2 by FDA under an Emergency Use Authorization (EUA).  This EUA will remain in effect (meaning this test can be used) for the duration of the COVID-19 declaration under Section 564(b)(1) of the Act, 21 U.S.C. section 360bbb-3(b)(1), unless the authorization is terminated or revoked sooner. Performed at [facility], [place] [address], [place], [place] [address]   Expectorated sputum assessment w rflx to resp cult     Status: None   Collection Time: 02/12/20  1:41 PM   Specimen: Sputum  Result Value Ref Range Status   Specimen Description SPUTUM  Final   Special Requests NONE  Final   Sputum evaluation   Final    THIS SPECIMEN IS ACCEPTABLE FOR SPUTUM CULTURE Performed at [facility], [place] [address], [place], [place] [address]    Report Status 02/12/2020 FINAL  Final  Culture, respiratory     Status: None (Preliminary result)   Collection Time: 02/12/20  1:41 PM   Specimen: SPU  Result Value Ref Range Status   Specimen Description   Final    SPUTUM Performed at [place] [address], [place], [place] [address]    Special Requests   Final    NONE Reflexed from [id number] Performed at [facility], [place] [address] [place], [place] [address]    Gram Stain   Final    RARE WBC PRESENT, PREDOMINANTLY PMN FEW SQUAMOUS EPITHELIAL CELLS PRESENT FEW BUDDING YEAST SEEN RARE GRAM POSITIVE COCCI IN PAIRS RARE GRAM VARIABLE ROD Performed at [facility], [place] [address], [place], [place] [address]    Culture PENDING  Incomplete   Report Status PENDING  Incomplete  Respiratory Panel by PCR      Status: None   Collection Time: 02/12/20  8:00 PM   Specimen: Nasopharyngeal Swab; Respiratory  Result Value Ref Range Status   Adenovirus NOT DETECTED NOT DETECTED Final   Coronavirus 229E NOT DETECTED NOT DETECTED Final    Comment: (NOTE) The Coronavirus on the Respiratory Panel, DOES NOT test for the novel  Coronavirus (2019 nCoV)    Coronavirus HKU1 NOT DETECTED NOT DETECTED Final   Coronavirus NL63 NOT DETECTED NOT DETECTED Final   Coronavirus OC43 NOT DETECTED NOT DETECTED Final   Metapneumovirus NOT DETECTED NOT DETECTED Final   Rhinovirus / Enterovirus NOT DETECTED NOT DETECTED Final   Influenza A NOT DETECTED NOT DETECTED Final   Influenza B NOT DETECTED NOT DETECTED Final   Parainfluenza Virus 1 NOT DETECTED NOT DETECTED Final   Parainfluenza Virus 2 NOT DETECTED NOT DETECTED Final   Parainfluenza Virus 3 NOT DETECTED NOT DETECTED Final   Parainfluenza Virus 4 NOT DETECTED NOT DETECTED Final   Respiratory Syncytial Virus NOT DETECTED NOT DETECTED Final   Bordetella pertussis NOT DETECTED NOT DETECTED Final   Chlamydophila pneumoniae NOT DETECTED NOT DETECTED Final   Mycoplasma pneumoniae NOT DETECTED NOT DETECTED Final    Comment: Performed at [facility], [place] [address] [place], [place] [address]      Radiology Studies: MR STERNUM W WO CONTRAST  Result Date: 02/11/2020 CLINICAL DATA:  History of lymphoma question of sternal lesion EXAM: MR STERNUM WITHOUT AND WITH CONTRAST TECHNIQUE: Multiplanar MR images of the sternum were obtained within the. CONTRAST:  39m GADAVIST GADOBUTROL 1 MMOL/ML IV SOLN COMPARISON:  CT chest Feb 10, 2020 FINDINGS: Musculoskeletal: Normal marrow signal seen throughout the visualized osseous structures and sternum. No osseous fracture, pathologic marrow infiltration or marrow edema. No abnormal enhancement seen throughout. The sternomanubrial joints are intact. Soft tissues: Bilateral breast prosthesis are seen with findings suggestive of  internal capsule rupture. A right-sided MediPort catheter is noted. No areas of abnormal enhancement along the chest wall. IMPRESSION: No evidence metastatic disease involving the chest wall. Electronically Signed   By: BPrudencio PairM.D.   On: 02/11/2020 22:19   ECHOCARDIOGRAM COMPLETE  Result Date: 02/13/2020    ECHOCARDIOGRAM REPORT   Patient Name:   TCHRISTASIA ANGELETTIDate of Exam: 02/13/2020 Medical Rec #:  [id number]     Height:       65.0 in Accession #:    [id number]    Weight:       192.2 lb Date of Birth:  [birth year]     BSA:          1.945 m Patient Age:    89years       BP:           119/88 mmHg Patient Gender: F              HR:           84 bpm. Exam Location:  Inpatient Procedure: 2D Echo Indications:    Acute Respiratory Insufficiency R06.89  History:        Patient has prior history of Echocardiogram examinations, most                 recent 11/09/2018. CHF, Pulmonary HTN; Risk Factors:Hypertension.  Sonographer:    CMikki SanteeRDCS (AE) Referring Phys: [id number] [facility] Sonographer Comments: Image acquisition challenging due to breast  implants. IMPRESSIONS  1. Images are somewhat limited due to acoustic windows.  2. Left ventricular ejection fraction, by estimation, is 65 to 70%. The left ventricle has normal function. The left ventricle has no regional wall motion abnormalities. Left ventricular diastolic parameters were normal.  3. Right ventricular systolic function is normal. The right ventricular size is normal. There is normal pulmonary artery systolic pressure. The estimated right ventricular systolic pressure is 56.2 mmHg.  4. The mitral valve is grossly normal. Mild mitral valve regurgitation.  5. Tricuspid valve regurgitation is moderate.  6. Mobile and relatively linear echodensity seen in right atrium, consistent with catheter in most views. If there is any concern for clinical endocarditis, would suggest TEE.  7. The aortic valve is tricuspid, moderately calcified. Aortic  valve regurgitation is mild.  8. The inferior vena cava is dilated in size with >50% respiratory variability, suggesting right atrial pressure of 8 mmHg. FINDINGS  Left Ventricle: Left ventricular ejection fraction, by estimation, is 65 to 70%. The left ventricle has normal function. The left ventricle has no regional wall motion abnormalities. The left ventricular internal cavity size was normal in size. There is  no left ventricular hypertrophy. Left ventricular diastolic parameters were normal. Right Ventricle: The right ventricular size is normal. No increase in right ventricular wall thickness. Right ventricular systolic function is normal. There is normal pulmonary artery systolic pressure. The tricuspid regurgitant velocity is 2.63 m/s, and  with an assumed right atrial pressure of 8 mmHg, the estimated right ventricular systolic pressure is 13.0 mmHg. Left Atrium: Left atrial size was normal in size. Right Atrium: Right atrial size was normal in size. Pericardium: There is no evidence of pericardial effusion. Mitral Valve: The mitral valve is grossly normal. Mild mitral annular calcification. Mild mitral valve regurgitation. Tricuspid Valve: The tricuspid valve is grossly normal. Tricuspid valve regurgitation is moderate. Aortic Valve: The aortic valve is tricuspid. Aortic valve regurgitation is mild. Aortic regurgitation PHT measures 348 msec. There is moderate calcification of the aortic valve. Aortic valve mean gradient measures 9.7 mmHg. Aortic valve peak gradient measures 18.3 mmHg. Aortic valve area, by VTI measures 1.01 cm. Pulmonic Valve: The pulmonic valve was not well visualized. Pulmonic valve regurgitation is trivial. Aorta: The aortic root is normal in size and structure. Venous: The inferior vena cava is dilated in size with greater than 50% respiratory variability, suggesting right atrial pressure of 8 mmHg. IAS/Shunts: No atrial level shunt detected by color flow Doppler.  LEFT VENTRICLE  PLAX 2D LVIDd:         3.90 cm  Diastology LVIDs:         2.80 cm  LV e' lateral:   10.90 cm/s LV PW:         0.80 cm  LV E/e' lateral: 11.6 LV IVS:        0.70 cm  LV e' medial:    6.74 cm/s LVOT diam:     1.80 cm  LV E/e' medial:  18.7 LV SV:         46 LV SV Index:   23 LVOT Area:     2.54 cm  RIGHT VENTRICLE RV S prime:     12.10 cm/s TAPSE (M-mode): 2.0 cm LEFT ATRIUM             Index       RIGHT ATRIUM          Index LA diam:        3.20 cm 1.65 cm/m  RA Area:     9.99 cm LA Vol (A2C):   20.9 ml 10.74 ml/m RA Volume:   18.40 ml 9.46 ml/m LA Vol (A4C):   25.0 ml 12.85 ml/m LA Biplane Vol: 23.9 ml 12.29 ml/m  AORTIC VALVE AV Area (Vmax):    1.02 cm AV Area (Vmean):   0.99 cm AV Area (VTI):     1.01 cm AV Vmax:           214.00 cm/s AV Vmean:          146.333 cm/s AV VTI:            0.452 m AV Peak Grad:      18.3 mmHg AV Mean Grad:      9.7 mmHg LVOT Vmax:         85.80 cm/s LVOT Vmean:        57.100 cm/s LVOT VTI:          0.179 m LVOT/AV VTI ratio: 0.40 AI PHT:            348 msec  AORTA Ao Root diam: 2.50 cm MITRAL VALVE                TRICUSPID VALVE MV Area (PHT): 4.96 cm     TR Peak grad:   27.7 mmHg MV Decel Time: 153 msec     TR Vmax:        263.00 cm/s MV E velocity: 126.00 cm/s MV A velocity: 124.00 cm/s  SHUNTS MV E/A ratio:  1.02         Systemic VTI:  0.18 m                             Systemic Diam: 1.80 cm Rozann Lesches MD Electronically signed by Rozann Lesches MD Signature Date/Time: 02/13/2020/12:59:21 PM    Final    VAS [place] UPPER EXTREMITY VENOUS DUPLEX  Result Date: 02/12/2020 UPPER VENOUS STUDY  Indications: Edema Comparison Study: 11/16/2019-negative right upper extremity venous duplex Performing Technologist: Maudry Mayhew MHA, RDMS, RVT, RDCS  Examination Guidelines: A complete evaluation includes B-mode imaging, spectral Doppler, color Doppler, and power Doppler as needed of all accessible portions of each vessel. Bilateral testing is considered an integral part of a  complete examination. Limited examinations for reoccurring indications may be performed as noted.  Right Findings: +----------+------------+---------+-----------+----------+-------+ RIGHT     CompressiblePhasicitySpontaneousPropertiesSummary +----------+------------+---------+-----------+----------+-------+ IJV           Full       Yes       Yes                      +----------+------------+---------+-----------+----------+-------+ Subclavian    Full       Yes       Yes                      +----------+------------+---------+-----------+----------+-------+ Axillary      Full       Yes       Yes                      +----------+------------+---------+-----------+----------+-------+ Brachial      Full       Yes       Yes                      +----------+------------+---------+-----------+----------+-------+ Radial  Full                                          +----------+------------+---------+-----------+----------+-------+ Ulnar         Full                                          +----------+------------+---------+-----------+----------+-------+ Cephalic      Full                                          +----------+------------+---------+-----------+----------+-------+ Basilic       Full                                          +----------+------------+---------+-----------+----------+-------+  Left Findings: +----------+------------+---------+-----------+----------+--------------+ LEFT      CompressiblePhasicitySpontaneousProperties   Summary     +----------+------------+---------+-----------+----------+--------------+ IJV           Full       Yes       Yes                             +----------+------------+---------+-----------+----------+--------------+ Subclavian    Full       Yes       Yes                             +----------+------------+---------+-----------+----------+--------------+ Axillary      Full        Yes       Yes                             +----------+------------+---------+-----------+----------+--------------+ Brachial      Full       Yes       Yes                             +----------+------------+---------+-----------+----------+--------------+ Radial        Full                                                 +----------+------------+---------+-----------+----------+--------------+ Ulnar         Full                                                 +----------+------------+---------+-----------+----------+--------------+ Cephalic      Full                                                 +----------+------------+---------+-----------+----------+--------------+ Basilic  Not visualized +----------+------------+---------+-----------+----------+--------------+  Summary:  Right: No evidence of deep vein thrombosis in the upper extremity. No evidence of superficial vein thrombosis in the upper extremity.  Left: No evidence of deep vein thrombosis in the upper extremity. No evidence of superficial vein thrombosis in the upper extremity.  *See table(s) above for measurements and observations.    Preliminary      Scheduled Meds: . aspirin  325 mg Oral QHS  . budesonide (PULMICORT) nebulizer solution  0.25 mg Nebulization BID  . Chlorhexidine Gluconate Cloth  6 each Topical Daily  . clonazePAM  1 mg Oral BID  . colchicine  0.6 mg Oral Q1500  . dronabinol  2.5 mg Oral BID AC  . escitalopram  20 mg Oral QPM  . estradiol  2 mg Oral QPM  . fluticasone furoate-vilanterol  1 puff Inhalation Daily  . furosemide  20 mg Intravenous BID  . guaiFENesin  600 mg Oral BID  . hydrocortisone sod succinate (SOLU-CORTEF) inj  50 mg Intravenous Q8H  . levothyroxine  112 mcg Oral Q0600  . metoprolol succinate  37.5 mg Oral Daily  . rosuvastatin  10 mg Oral Daily  . topiramate  150 mg Oral QHS   Continuous Infusions: . cefTRIAXone  (ROCEPHIN)  IV 1 g (02/13/20 1212)     LOS: 2 days    Time spent: 25 minutes spent in the coordination of care today.    Jonnie Finner, DO [facility]  If 7PM-7AM, please contact night-coverage [email or web address] 02/13/2020, 3:00 PM

## 2020-02-13 NOTE — Progress Notes (Signed)
  Echocardiogram 2D Echocardiogram has been performed.  Jennette Dubin 02/13/2020, 12:46 PM

## 2020-02-14 DIAGNOSIS — F419 Anxiety disorder, unspecified: Secondary | ICD-10-CM

## 2020-02-14 DIAGNOSIS — J9611 Chronic respiratory failure with hypoxia: Secondary | ICD-10-CM

## 2020-02-14 DIAGNOSIS — I776 Arteritis, unspecified: Secondary | ICD-10-CM

## 2020-02-14 DIAGNOSIS — F329 Major depressive disorder, single episode, unspecified: Secondary | ICD-10-CM

## 2020-02-14 DIAGNOSIS — J45909 Unspecified asthma, uncomplicated: Secondary | ICD-10-CM

## 2020-02-14 LAB — CBC WITH DIFFERENTIAL/PLATELET
Abs Immature Granulocytes: 0.03 10*3/uL (ref 0.00–0.07)
Basophils Absolute: 0 10*3/uL (ref 0.0–0.1)
Basophils Relative: 1 %
Eosinophils Absolute: 0.1 10*3/uL (ref 0.0–0.5)
Eosinophils Relative: 1 %
HCT: 33.5 % — ABNORMAL LOW (ref 36.0–46.0)
Hemoglobin: 10.5 g/dL — ABNORMAL LOW (ref 12.0–15.0)
Immature Granulocytes: 0 %
Lymphocytes Relative: 28 %
Lymphs Abs: 2.3 10*3/uL (ref 0.7–4.0)
MCH: 29.6 pg (ref 26.0–34.0)
MCHC: 31.3 g/dL (ref 30.0–36.0)
MCV: 94.4 fL (ref 80.0–100.0)
Monocytes Absolute: 0.8 10*3/uL (ref 0.1–1.0)
Monocytes Relative: 10 %
Neutro Abs: 5.1 10*3/uL (ref 1.7–7.7)
Neutrophils Relative %: 60 %
Platelets: 259 10*3/uL (ref 150–400)
RBC: 3.55 MIL/uL — ABNORMAL LOW (ref 3.87–5.11)
RDW: 13.9 % (ref 11.5–15.5)
WBC: 8.4 10*3/uL (ref 4.0–10.5)
nRBC: 0 % (ref 0.0–0.2)

## 2020-02-14 LAB — RENAL FUNCTION PANEL
Albumin: 3.9 g/dL (ref 3.5–5.0)
Anion gap: 9 (ref 5–15)
BUN: 18 mg/dL (ref 6–20)
CO2: 29 mmol/L (ref 22–32)
Calcium: 9.3 mg/dL (ref 8.9–10.3)
Chloride: 105 mmol/L (ref 98–111)
Creatinine, Ser: 0.85 mg/dL (ref 0.44–1.00)
GFR calc Af Amer: >60 mL/min (ref >60)
GFR calc non Af Amer: >60 mL/min (ref >60)
Glucose, Bld: 99 mg/dL (ref 70–99)
Phosphorus: 4.2 mg/dL (ref 2.5–4.6)
Potassium: 3.7 mmol/L (ref 3.5–5.1)
Sodium: 143 mmol/L (ref 135–145)

## 2020-02-14 LAB — MAGNESIUM: Magnesium: 2.1 mg/dL (ref 1.7–2.4)

## 2020-02-14 LAB — PROCALCITONIN: Procalcitonin: <0.1 ng/mL

## 2020-02-14 MED ORDER — HYDROCORTISONE 10 MG PO TABS
50.0000 mg | ORAL_TABLET | Freq: Three times a day (TID) | ORAL | 0 refills | Status: AC
Start: 2020-02-14 — End: 2020-02-17

## 2020-02-14 MED ORDER — DRONABINOL 2.5 MG PO CAPS: 2.5000 mg | ORAL_CAPSULE | Freq: Two times a day (BID) | ORAL | 0 refills | Status: AC

## 2020-02-14 MED ORDER — CEFDINIR 300 MG PO CAPS
300.0000 mg | ORAL_CAPSULE | Freq: Two times a day (BID) | ORAL | 0 refills | Status: AC
Start: 2020-02-14 — End: 2020-02-18

## 2020-02-14 MED ORDER — HYDROCORTISONE 10 MG PO TABS: 10.0000 mg | ORAL_TABLET | ORAL | Status: DC

## 2020-02-14 MED ORDER — FUROSEMIDE 40 MG PO TABS: 40.0000 mg | ORAL_TABLET | Freq: Every day | ORAL | Status: DC

## 2020-02-14 MED ORDER — HEPARIN SOD (PORK) LOCK FLUSH 100 UNIT/ML IV SOLN
500.0000 [IU] | Freq: Once | INTRAVENOUS | Status: AC
  Administered 2020-02-14: 500 [IU] via INTRAVENOUS
  Filled 2020-02-14: qty 5

## 2020-02-14 MED ORDER — DRONABINOL 2.5 MG PO CAPS: 2.5000 mg | ORAL_CAPSULE | Freq: Two times a day (BID) | ORAL | 0 refills | Status: DC

## 2020-02-14 MED ORDER — GUAIFENESIN ER 600 MG PO TB12: 600.0000 mg | ORAL_TABLET | Freq: Two times a day (BID) | ORAL | 0 refills | Status: AC

## 2020-02-14 MED ORDER — FUROSEMIDE 40 MG PO TABS
40.0000 mg | ORAL_TABLET | Freq: Two times a day (BID) | ORAL | 0 refills | Status: DC
Start: 2020-02-14 — End: 2020-04-10

## 2020-02-14 MED ORDER — BUDESONIDE 0.25 MG/2ML IN SUSP: 0.2500 mg | Freq: Two times a day (BID) | RESPIRATORY_TRACT | 0 refills | Status: DC

## 2020-02-14 NOTE — Discharge Summary (Signed)
. Physician Discharge Summary  Susan SPRUIELL JYN:[id number] DOB: [birth year] DOA: 02/10/2020  PCP: Su Grand, MD  Admit date: 02/10/2020 Discharge date: 02/14/2020  Admitted From: Home Disposition:  Discharged to home.   Recommendations for Outpatient Follow-up:  1. Follow up with PCP in 1 weeks 2. Follow up with Rheumatology as scheduled. 3. Follow up with Pulmonology for ABG, PFTs. Call for appt.  4. Follow up with Oncology as scheduled. 5. Follow up with Plastic surgery for evaluation of rupture breast implants and removal.  Discharge Condition: Stable  CODE STATUS: FULL   Brief/Interim Summary: [name] a 49 y.o.femalewith medical history significant forChronic lymphedemea, migraine/headacheon nurtec/topamax/Galcanezumab q 4wk injection,lymphedema,history of thyroid cancer, chest lymphoma, breast ductal carcinoma, Addison's disease, cervical cancer, superficial venous thrombosis in lower extremity and upper extremities previously on Lovenox now on aspirin325 mgcoming to the oncology clinic with multiple symptoms complaints. She reports she has "Rt sided chf-Adriamycin induced, chronicresp failure w/ hypoxia from bleomycin on lasix and 3-4 l [place] O2".  SheC/o headache for xfewdays on left parietal side, c/o dry mouth but swollen arms. She also complains oftrouble breathing, blurred vision,headachefatigue, arm swelling.She was dizzy at [facility] days ago,and hasgeneralized weakness and reports shealmost passed out twice in last 5 days. She Called neuro office at [facility] headache and reports they areplanning for pain management- botox inj in 2 wks. She was seen in Oncology clinic and sent to ED for iv hydration but looked wet and dry- mouthand was told she is "third spacing" but "intravascularly dry" and was given NSbolus for hydration.She underwent extensive work up In ED,VSSTABLE, cbc. CMP Normal, CT head normal, CXR "okay", CTA- bronchiolitis  features.MR MRV brain - normal no intracranial venous thrombosis. TSH low 0.06 but improving from before 0.016 yr ago. FT4/FT3 sent, Cortisol ordered. She also received fentanyl 25 mcg x2, benadryl 12.5 mg iv, ativan 1 mg iv. ED discussed with oncology and advised admission for " fine tuning" She also c/ocough with brownish sputum.She otherwise denies any fever, chills, chest pain, shortness of breath, abdominal pain, focal weakness. She has chronic nausea takes IV Benadryl injection at home, reports she is allergic to most of the nausea medication. She wants to try Toradol for the headache.  5/13: C/o continued cough today and edema. She is tight and course on exam. Check sputum Cx, change abx to rocephin/zithro, increase steroids. Spoke with onco. Will get contrasted MRI of sternum. She reports low UOP. Get bladder scan and follows I&Os.  5/14:MRI sternum does not reveal any CA. It did show ruptured breast implants. I reviewed with plastic surgery. This can be followed up outpatient. Her respiratory status is not improved. She is still tight w/ terrible cough and is now producing sputum that is blood-tinged. I have already increased her cortef to 54m TID and she is on nebs and abx. I have spoken with pulm. They will take a look. No good reason for her lymphedema. Checking BUE dopplers, but I don't think I'll find a clot. She's starting to move fluid with lasix now. Will continue, but without evidence of HF or CKD; this isn't the long term answer.  5/15: Her BUE edema is improved. She is moving fluid: 2.8L down since admission. We will look to transition her to lasix 440mPO BID tomorrow. HRCT chest pending results. Echo performed: EF 6586-57%diastolic fxn normal. RVP is negaitve. Sputum negative. Her pulm exam seems better this morning with more air movement at the bases, but still UAT/tight cough  superiorly. Continuing increased steroids, nebs, guaifenesin, and abx (zithro ends today). I  think we can look to d/c probably tomorrow w/ nebs and PO abx/lasix w/ outpt follow up.    5/16: Edema is improving. Would continue BID lasix (as PO dosing) for next couple of days. Will also continue stress dose steroids during that time. She will complete abx on Wednesday. She needs to follow up with rheum as scheduled and follow up with pulm for ABG and PFTs after she is off stress dose steroids. She is improved today. Air movement is better. Cough is not as course. Complicated case that will need close follow up, but it appears she has everyone lined up. She is ok to discharge to home.   Discharge Diagnoses:  Principal Problem:   Headache Active Problems:   Generalized weakness  Left-sided headache w/ history of migraine - on nurtec/topamax/Galcanezumab q 4wk injection at home. - Home medications resumed, added IV Toradol as needed - She has a follow-up with [facility] neurology/pain clinic for Botox trial for her headache. CT head MRV reviewed no acute finding. - Nonfocal on exam. - 5/13: she is not focused on HA this AM, continue to monitor     - 5/15: HA exacerbated by cough. Let's control cough a little better.     - 5/16: improved today.  Generalized weakness/dizziness Addison's disease - orthostatic blood pressures pending - 5/13: PT eval: HHPT - random cortisol is 5.9; check AM cortisol - 5/14: AM cortisol is up. She's on stress dose cortef. Follow.     - 5/16: continue stress dose cortef through Tuesday; then go back to home dosing  Nausea - continue benadryl, ativan - 5/13: will see if marinol will help - 5/14: N improved with marinol     - 5/15: says nausea worsened ON, but she also had diluadid as well; be cautious with narcotic use     - 5/16: improved today  Upper extremity lymphedema - worsening edema history of right-sided heart failure - CXR and CTA w/ no features of CHF or PE.  - placed on lasix 49m  IV - 5/13: she reports poor UOP -- nothing is documented - SCr is stable. BNP is 90. - 5/14: BUE dopplers ordered. Now moving fluid with lasix. Monitor.     - 5/15: BUE improving; BUE dopplers negative     - 5/16: will go home on a couple more days of extra lasix, then resume regular home dosing on Wednesday.  Cough Sputum production - CTA shows bronchiolitis - 5/13: has chronically been on doxy; will switch her to rocephin/zithro - continue qvar inhaler, albuterol inhaler, and nebs - sputum Cx ordered - she sounds tight and course on exam; increase steroids - 5/14: still tight and with terrible cough; producing some blood-tinged sputum; have spoken with PCCM. They will take a look.     - 5/15: appreciate PCCM assistance; awaiting HRCT results, echo results noted; sputum Cx neg, continuing abx, nebs, steroids     - 5/16: continue cefdinir at discharge; continue nebs; follow up with pulmonology  Hypokalemia - resolved  Chronic hypoxic respiratory failure Chronic right-sided CHF - Continue home oxygen 3 to 4 L nasal cannula. - 5/13: BNP is 90; CTA w/o cardiomegaly or pulm edema - now on lasix 20 mg IV BID - on 3L [place] during exam  Hx of thyroid cancer - TSH low, patient reports chronically low. - 5/13: T4 is ok; T3 is pending. Continue synthroid - follow up with endocrine  Chest lymphoma - Patient is scheduled to get MRI sternum by Dr. Lindi Adie. - 5/13: spoke with dr. Lindi Adie this afternoon. Pt has a history of multiple CA and still not sure what is her root problem. They have requested that we get the MRI of the sternum as a contrasted study while she is here. We will order this exam. Appreciate their assistance. They will be following during her stay. - 5/14: MRI neg for CA, no further onco recs. Appreciate assistance.  Hx of venous thrombosis in lower extremity and upper extremities -  previously on Lovenox now on aspirin 325 mg; continue ASA  Anxiety Depression - continue her Klonopin, Lexapro  Ruptured breast implant     - spoke with plastic surgery; can be followed up outpt  Discharge Instructions   Allergies as of 02/14/2020      Reactions   Mushroom Extract Complex Anaphylaxis   Na Ferric Gluc Cplx In Sucrose Anaphylaxis   Cymbalta [duloxetine Hcl] Swelling, Anxiety   Succinylcholine Other (See Comments)   Lock Jaw   Buprenorphine Hcl Hives   Compazine Other (See Comments)   Altered mental status Aggression   Metoclopramide Other (See Comments)   Dystonia   Morphine And Related Hives   Ondansetron Hives, Rash   Promethazine Hcl Hives   Tegaderm Ag Mesh [silver] Rash   Old formulation only, is able to tolerate new formulation      Medication List    TAKE these medications   aspirin 325 MG tablet Take 325 mg by mouth at bedtime.   beclomethasone 80 MCG/ACT inhaler Commonly known as: QVAR Inhale 1 puff into the lungs 2 (two) times daily.   benzonatate 200 MG capsule Commonly known as: TESSALON Take 1 capsule (200 mg total) by mouth 3 (three) times daily as needed for cough.   budesonide 0.25 MG/2ML nebulizer solution Commonly known as: PULMICORT Take 2 mLs (0.25 mg total) by nebulization 2 (two) times daily for 5 days.   cefdinir 300 MG capsule Commonly known as: OMNICEF Take 1 capsule (300 mg total) by mouth 2 (two) times daily for 7 doses.   clonazePAM 1 MG tablet Commonly known as: KLONOPIN Take 1 mg by mouth 2 (two) times daily.   colchicine 0.6 MG tablet Take 0.6 mg by mouth daily in the afternoon.   Dexilant 60 MG capsule Generic drug: dexlansoprazole Take 60 mg by mouth daily.   diphenhydrAMINE 50 MG/ML injection Commonly known as: BENADRYL Inject 1 mL (50 mg total) into the vein every 6 (six) hours as needed (nausea).   dronabinol 2.5 MG capsule Commonly known as: Marinol Take 1 capsule (2.5 mg total) by mouth 2  (two) times daily before lunch and supper for 14 days.   EPINEPHrine 0.3 mg/0.3 mL Soaj injection Commonly known as: EPI-PEN INJECT 0.3 MLS (0.3 MG TOTAL) INTO THE MUSCLE AS NEEDED FOR ANAPHYLAXIS.   escitalopram 20 MG tablet Commonly known as: LEXAPRO Take 20 mg by mouth every evening.   estradiol 2 MG tablet Commonly known as: ESTRACE Take 2 mg by mouth every evening.   fluticasone furoate-vilanterol 200-25 MCG/INH Aepb Commonly known as: BREO ELLIPTA Inhale 1 puff into the lungs daily.   furosemide 40 MG tablet Commonly known as: Lasix Take 1 tablet (40 mg total) by mouth 2 (two) times daily for 5 doses. What changed: You were already taking a medication with the same name, and this prescription was added. Make sure you understand how and when to take each.   furosemide  40 MG tablet Commonly known as: LASIX Take 1 tablet (40 mg total) by mouth daily. Start taking on: Feb 17, 2020 What changed: These instructions start on Feb 17, 2020. If you are unsure what to do until then, ask your doctor or other care provider.   Galcanezumab-gnlm 120 MG/ML Soaj Inject 120 mg into the skin every 28 (twenty-eight) days.   gi cocktail Susp suspension Take 30 mLs daily as needed by mouth for indigestion (BLEEDING ULCERS). Shake well.   guaiFENesin 600 MG 12 hr tablet Commonly known as: MUCINEX Take 1 tablet (600 mg total) by mouth 2 (two) times daily for 5 days.   heparin NICU/SCN flush 1 UNIT/ML Soln Heparin Lock Flush Solution - 500 USP UNITS/5 mL  #30 pack with 1 refill   hydrocortisone 10 MG tablet Commonly known as: Cortef Take 5 tablets (50 mg total) by mouth 3 (three) times daily for 7 doses. What changed: You were already taking a medication with the same name, and this prescription was added. Make sure you understand how and when to take each.   hydrocortisone 10 MG tablet Commonly known as: CORTEF Take 1-2 tablets (10-20 mg total) by mouth See admin instructions. Take  20 mg in the am and 27m in the evening Start taking on: Feb 17, 2020 What changed: These instructions start on Feb 17, 2020. If you are unsure what to do until then, ask your doctor or other care provider.   levothyroxine 112 MCG tablet Commonly known as: SYNTHROID Take 112 mcg by mouth daily before breakfast.   LORazepam 0.5 MG tablet Commonly known as: ATIVAN TAKE 1 TO 2 TABLETS BY MOUTH TWICE A DAY AS NEEDED FOR NAUSEA What changed: See the new instructions.   metoprolol succinate 25 MG 24 hr tablet Commonly known as: TOPROL-XL Take 37.5 mg by mouth daily.   Nucala 100 MG Solr Generic drug: Mepolizumab Inject 100 mg into the skin every 28 (twenty-eight) days.   Nurtec 75 MG Tbdp Generic drug: Rimegepant Sulfate Take 75 mg by mouth daily as needed (Migraine).   oxyCODONE-acetaminophen 5-325 MG tablet Commonly known as: PERCOCET/ROXICET Take 1 tablet by mouth every 8 (eight) hours as needed for severe pain.   OXYGEN Inhale 4 L into the lungs continuous.   rosuvastatin 10 MG tablet Commonly known as: CRESTOR Take 10 mg by mouth daily.   silver sulfADIAZINE 1 % cream Commonly known as: SILVADENE Apply 1 application topically daily as needed (Open Wound).   sodium chloride flush 0.9 % Soln injection 10 mL syringes - #30 count with 1 refill   sucralfate 1 g tablet Commonly known as: CARAFATE Take 1 g by mouth daily as needed (FOR ULCERS).   topiramate 50 MG tablet Commonly known as: TOPAMAX Take 150 mg by mouth at bedtime.   Ventolin HFA 108 (90 Base) MCG/ACT inhaler Generic drug: albuterol Inhale 2 puffs into the lungs every 6 (six) hours as needed for wheezing or shortness of breath.   albuterol (2.5 MG/3ML) 0.083% NEBU 3 mL, albuterol (5 MG/ML) 0.5% NEBU 0.5 mL Inhale 3 mg into the lungs every 6 (six) hours as needed (SOB).   zolpidem 12.5 MG CR tablet Commonly known as: AMBIEN CR Take 12.5 mg by mouth at bedtime.      Follow-up Information    Toppin,  JAntionette Poles MD Follow up in 1 week(s).   Specialty: Internal Medicine Why: Hospital follow up. Contact information: [phone]  Allergies  Allergen Reactions  . Mushroom Extract Complex Anaphylaxis  . Na Ferric Gluc Cplx In Sucrose Anaphylaxis  . Cymbalta [Duloxetine Hcl] Swelling and Anxiety  . Succinylcholine Other (See Comments)    Lock Jaw  . Buprenorphine Hcl Hives  . Compazine Other (See Comments)    Altered mental status Aggression  . Metoclopramide Other (See Comments)    Dystonia  . Morphine And Related Hives  . Ondansetron Hives and Rash  . Promethazine Hcl Hives  . Tegaderm Ag Mesh [Silver] Rash    Old formulation only, is able to tolerate new formulation    Consultations:  Pulmonology   Procedures/Studies: CT Head Wo Contrast  Result Date: 02/10/2020 CLINICAL DATA:  Headache, history of multiple cancers EXAM: CT HEAD WITHOUT CONTRAST TECHNIQUE: Contiguous axial images were obtained from the base of the skull through the vertex without intravenous contrast. COMPARISON:  2013 FINDINGS: Brain: There is no acute intracranial hemorrhage, mass effect, or edema. Gray-white differentiation is preserved. There is no extra-axial fluid collection. Ventricles and sulci are within normal limits in size and configuration. Vascular: No hyperdense vessel or unexpected calcification. Skull: Calvarium is unremarkable. Sinuses/Orbits: No acute finding. Other: None. IMPRESSION: No acute intracranial abnormality. Electronically Signed   By: Macy Mis M.D.   On: 02/10/2020 13:49   CT Angio Chest PE W and/or Wo Contrast  Result Date: 02/10/2020 CLINICAL DATA:  Shortness of breath, hemoptysis, cough. History of multiple cancers EXAM: CT ANGIOGRAPHY CHEST WITH CONTRAST TECHNIQUE: Multidetector CT imaging of the chest was performed using the standard protocol during bolus administration of intravenous contrast. Multiplanar CT image  reconstructions and MIPs were obtained to evaluate the vascular anatomy. CONTRAST:  154m OMNIPAQUE IOHEXOL 350 MG/ML SOLN COMPARISON:  12/29/2019 FINDINGS: Cardiovascular: Satisfactory opacification of the pulmonary arteries to the segmental level. No evidence of pulmonary embolism. Normal heart size. No pericardial effusion. Thoracic aorta is nonaneurysmal. Minimal atherosclerotic calcification of the aortic arch. Right-sided chest port is seen with distal tip terminating at the superior cavoatrial junction. Mediastinum/Nodes: Prior right axillary lymph node dissection. No axillary lymphadenopathy. No enlarged mediastinal or hilar lymph nodes. Status post thyroidectomy. Trachea and esophagus are within normal limits. Lungs/Pleura: Stable subpleural fat containing nodule along the posterior right hemidiaphragm (series 6, image 97) is unchanged from 2016, benign. No new pulmonary nodule. There is mosaic attenuation of the bilateral lung fields. No focal lobar consolidation. No pleural effusion or pneumothorax. Upper Abdomen: No acute abnormality. Musculoskeletal: Bilateral breast prostheses. No acute osseous abnormality. No suspicious lytic or sclerotic bone lesion. Review of the MIP images confirms the above findings. IMPRESSION: 1. No evidence of pulmonary embolism. 2. Mosaic attenuation of the bilateral lung fields, which can be seen with small airways disease/bronchiolitis. A Electronically Signed   By: NDavina PokeD.O.   On: 02/10/2020 14:59   MR HUMERUS LEFT W WO CONTRAST  Result Date: 01/31/2020 CLINICAL DATA:  Breast cancer and history of Hodgkin's lymphoma along with renal insufficiency. Palpable lump along the skin. EXAM: MRI OF THE LEFT HUMERUS WITHOUT AND WITH CONTRAST TECHNIQUE: Multiplanar, multisequence MR imaging of the left upper arm was performed before and after the administration of intravenous contrast. CONTRAST:  116mGADAVIST GADOBUTROL 1 MMOL/ML IV SOLN COMPARISON:  Ultrasound  01/13/2020 and bone scan of 12/29/2019 FINDINGS: Bones/Joint/Cartilage The humeral shaft in the region of concern appears normal. Ligaments N/A Muscles and Tendons Unremarkable Soft tissues The marked region of concern along the left posterolateral upper arm, there is infiltrative subcutaneous edema. If there  is any enhancement at all it is minimal. I do not see a worrisome masslike appearance or a drainable collection. Some of this edema signal extends adjacent to the superficial fascia margin of the triceps but does not involve the triceps muscle. There is also some mild overlying cutaneous thickening, for example on image 6/16. IMPRESSION: 1. In the region of concern there is mild infiltrative subcutaneous edema which could be inflammatory or conceivably from subcutaneous fat necrosis. I do not see a specific lymph node or a solid lesion. Electronically Signed   By: Van Clines M.D.   On: 01/31/2020 18:13   MR ABDOMEN WWO CONTRAST  Result Date: 02/02/2020 CLINICAL DATA:  Focality history including lymphoma, breast cancers, cervical cancer thyroid cancer and melanoma. Indeterminate lesion in the LEFT kidney on CT. EXAM: MRI ABDOMEN WITHOUT AND WITH CONTRAST TECHNIQUE: Multiplanar multisequence MR imaging of the abdomen was performed both before and after the administration of intravenous contrast. CONTRAST:  61m MULTIHANCE GADOBENATE DIMEGLUMINE 529 MG/ML IV SOLN COMPARISON:  CT 01/17/2020 FINDINGS: Lower chest:  Lung bases are clear. Hepatobiliary: Multiple small benign cysts within liver parenchyma. Gallbladder is normal. Common bile duct normal caliber. Mild hepatic steatosis demonstrated on opposed phase imaging (series 8). Pancreas: Normal pancreatic parenchymal intensity. No ductal dilatation or inflammation. Spleen: Normal spleen. Adrenals/urinary tract: Adrenal glands normal. The small exophytic lesion along the deep margin of the LEFT kidney measures 6 mm (image 26/3) and is hyperintense on T2  weighted imaging. This lesion is hypointense on precontrast T1 weighted imaging and demonstrates no post-contrast enhancement (image 72/series 11). Findings are consistent with a benign renal cyst. Additional cysts within the LEFT and RIGHT renal cortex without enhancement. Stomach/Bowel: Stomach and limited of the small bowel is unremarkable Vascular/Lymphatic: Abdominal aortic normal caliber. No retroperitoneal periportal lymphadenopathy. Musculoskeletal: No aggressive osseous lesion IMPRESSION: 1. LEFT renal lesion of concern on comparison CT corresponds to a benign Bosniak 1 renal cyst. 2. Additional bilateral Bosniak 1 renal cortical cysts. 3. Mild hepatic steatosis. Electronically Signed   By: SSuzy BouchardM.D.   On: 02/02/2020 08:49   MR STERNUM W WO CONTRAST  Result Date: 02/11/2020 CLINICAL DATA:  History of lymphoma question of sternal lesion EXAM: MR STERNUM WITHOUT AND WITH CONTRAST TECHNIQUE: Multiplanar MR images of the sternum were obtained within the. CONTRAST:  157mGADAVIST GADOBUTROL 1 MMOL/ML IV SOLN COMPARISON:  CT chest Feb 10, 2020 FINDINGS: Musculoskeletal: Normal marrow signal seen throughout the visualized osseous structures and sternum. No osseous fracture, pathologic marrow infiltration or marrow edema. No abnormal enhancement seen throughout. The sternomanubrial joints are intact. Soft tissues: Bilateral breast prosthesis are seen with findings suggestive of internal capsule rupture. A right-sided MediPort catheter is noted. No areas of abnormal enhancement along the chest wall. IMPRESSION: No evidence metastatic disease involving the chest wall. Electronically Signed   By: BiPrudencio Pair.D.   On: 02/11/2020 22:19   DG Chest Portable 1 View  Result Date: 02/10/2020 CLINICAL DATA:  Shortness of breath. EXAM: PORTABLE CHEST 1 VIEW COMPARISON:  July 09, 2019. FINDINGS: The heart size and mediastinal contours are within normal limits. Stable right internal jugular Port-A-Cath  is noted. Minimal bibasilar subsegmental atelectasis is noted. No pneumothorax or pleural effusion is noted. The visualized skeletal structures are unremarkable. IMPRESSION: Minimal bibasilar subsegmental atelectasis. Electronically Signed   By: JaMarijo Conception.D.   On: 02/10/2020 13:08   MR MRV HEAD W WO CONTRAST  Result Date: 02/10/2020 CLINICAL DATA:  History of breast  cancer and lymphoma. History of thyroid cancer and cervical cancer. Headache. Assess for intracranial venous thrombosis. EXAM: MR VENOGRAM HEAD WITHOUT AND WITH CONTRAST TECHNIQUE: Angiographic images of the intracranial venous structures were obtained using MRV technique without and with intravenous contrast. CONTRAST:  23m GADAVIST GADOBUTROL 1 MMOL/ML IV SOLN COMPARISON:  Head CT same day FINDINGS: The study is normal. Superior sagittal sinus is patent and normal. Transverse sinuses are normal. Sigmoid sinuses are normal. Jugular veins both show flow. Deep veins are patent. No evidence of superficial venous thrombosis. No abnormal brain enhancement seen on limited parenchymal imaging. IMPRESSION: Normal study. No cause of headache identified. No evidence of intracranial venous thrombosis. Electronically Signed   By: MNelson ChimesM.D.   On: 02/10/2020 16:16   ECHOCARDIOGRAM COMPLETE  Result Date: 02/13/2020    ECHOCARDIOGRAM REPORT   Patient Name:   Susan KIESELDate of Exam: 02/13/2020 Medical Rec #:  [id number]     Height:       65.0 in Accession #:    [id number]    Weight:       192.2 lb Date of Birth:  612-25-1972     BSA:          1.945 m Patient Age:    89years       BP:           119/88 mmHg Patient Gender: F              HR:           84 bpm. Exam Location:  Inpatient Procedure: 2D Echo Indications:    Acute Respiratory Insufficiency R06.89  History:        Patient has prior history of Echocardiogram examinations, most                 recent 11/09/2018. CHF, Pulmonary HTN; Risk Factors:Hypertension.  Sonographer:    CMikki SanteeRDCS (AE) [facility]erring Phys: [id number] Comments: Image acquisition challenging due to breast implants. IMPRESSIONS  1. Images are somewhat limited due to acoustic windows.  2. Left ventricular ejection fraction, by estimation, is 65 to 70%. The left ventricle has normal function. The left ventricle has no regional wall motion abnormalities. Left ventricular diastolic parameters were normal.  3. Right ventricular systolic function is normal. The right ventricular size is normal. There is normal pulmonary artery systolic pressure. The estimated right ventricular systolic pressure is 375.6mmHg.  4. The mitral valve is grossly normal. Mild mitral valve regurgitation.  5. Tricuspid valve regurgitation is moderate.  6. Mobile and relatively linear echodensity seen in right atrium, consistent with catheter in most views. If there is any concern for clinical endocarditis, would suggest TEE.  7. The aortic valve is tricuspid, moderately calcified. Aortic valve regurgitation is mild.  8. The inferior vena cava is dilated in size with >50% respiratory variability, suggesting right atrial pressure of 8 mmHg. FINDINGS  Left Ventricle: Left ventricular ejection fraction, by estimation, is 65 to 70%. The left ventricle has normal function. The left ventricle has no regional wall motion abnormalities. The left ventricular internal cavity size was normal in size. There is  no left ventricular hypertrophy. Left ventricular diastolic parameters were normal. Right Ventricle: The right ventricular size is normal. No increase in right ventricular wall thickness. Right ventricular systolic function is normal. There is normal pulmonary artery systolic pressure. The tricuspid regurgitant velocity is 2.63 m/s, and  with an assumed right atrial  pressure of 8 mmHg, the estimated right ventricular systolic pressure is 27.0 mmHg. Left Atrium: Left atrial size was normal in size. Right Atrium: Right atrial size  was normal in size. Pericardium: There is no evidence of pericardial effusion. Mitral Valve: The mitral valve is grossly normal. Mild mitral annular calcification. Mild mitral valve regurgitation. Tricuspid Valve: The tricuspid valve is grossly normal. Tricuspid valve regurgitation is moderate. Aortic Valve: The aortic valve is tricuspid. Aortic valve regurgitation is mild. Aortic regurgitation PHT measures 348 msec. There is moderate calcification of the aortic valve. Aortic valve mean gradient measures 9.7 mmHg. Aortic valve peak gradient measures 18.3 mmHg. Aortic valve area, by VTI measures 1.01 cm. Pulmonic Valve: The pulmonic valve was not well visualized. Pulmonic valve regurgitation is trivial. Aorta: The aortic root is normal in size and structure. Venous: The inferior vena cava is dilated in size with greater than 50% respiratory variability, suggesting right atrial pressure of 8 mmHg. IAS/Shunts: No atrial level shunt detected by color flow Doppler.  LEFT VENTRICLE PLAX 2D LVIDd:         3.90 cm  Diastology LVIDs:         2.80 cm  LV e' lateral:   10.90 cm/s LV PW:         0.80 cm  LV E/e' lateral: 11.6 LV IVS:        0.70 cm  LV e' medial:    6.74 cm/s LVOT diam:     1.80 cm  LV E/e' medial:  18.7 LV SV:         46 LV SV Index:   23 LVOT Area:     2.54 cm  RIGHT VENTRICLE RV S prime:     12.10 cm/s TAPSE (M-mode): 2.0 cm LEFT ATRIUM             Index       RIGHT ATRIUM          Index LA diam:        3.20 cm 1.65 cm/m  RA Area:     9.99 cm LA Vol (A2C):   20.9 ml 10.74 ml/m RA Volume:   18.40 ml 9.46 ml/m LA Vol (A4C):   25.0 ml 12.85 ml/m LA Biplane Vol: 23.9 ml 12.29 ml/m  AORTIC VALVE AV Area (Vmax):    1.02 cm AV Area (Vmean):   0.99 cm AV Area (VTI):     1.01 cm AV Vmax:           214.00 cm/s AV Vmean:          146.333 cm/s AV VTI:            0.452 m AV Peak Grad:      18.3 mmHg AV Mean Grad:      9.7 mmHg LVOT Vmax:         85.80 cm/s LVOT Vmean:        57.100 cm/s LVOT VTI:           0.179 m LVOT/AV VTI ratio: 0.40 AI PHT:            348 msec  AORTA Ao Root diam: 2.50 cm MITRAL VALVE                TRICUSPID VALVE MV Area (PHT): 4.96 cm     TR Peak grad:   27.7 mmHg MV Decel Time: 153 msec     TR Vmax:        263.00 cm/s MV E velocity:  126.00 cm/s MV A velocity: 124.00 cm/s  SHUNTS MV E/A ratio:  1.02         Systemic VTI:  0.18 m                             Systemic Diam: 1.80 cm Rozann Lesches MD Electronically signed by Rozann Lesches MD Signature Date/Time: 02/13/2020/12:59:21 PM    Final    CT Renal Stone Study  Result Date: 01/17/2020 CLINICAL DATA:  Left frank pain. History of nephrolithiasis. History of lymphoma, breast cancer, cervical cancer, thyroid cancer and melanoma. EXAM: CT ABDOMEN AND PELVIS WITHOUT CONTRAST TECHNIQUE: Multidetector CT imaging of the abdomen and pelvis was performed following the standard protocol without IV contrast. COMPARISON:  12/29/2019 FINDINGS: Lower chest: Stable bilateral breast implants. Normal sized heart. The previously demonstrated fat containing nodule along the right hemidiaphragm at the right lung base has a more medium density appearance today. This measures 11 x 8 mm on image number 22 series 4, unchanged. No parenchymal lung nodules are seen. No pleural fluid. Hepatobiliary: No significant change in small liver cysts. Normal appearing gallbladder. Pancreas: Unremarkable. No pancreatic ductal dilatation or surrounding inflammatory changes. Spleen: Normal in size without focal abnormality. Adrenals/Urinary Tract: Normal appearing adrenal glands. 4 mm upper pole left renal calculus. Additional tiny lower pole left renal calculus. 5 mm exophytic mass arising from the posterior aspect of the mid to lower left kidney. Unremarkable right kidney. Interval 5 mm calculus at the left ureterovesical junction without dilatation of the left renal collecting system or ureter. Stable multiple bilateral pelvic phleboliths. Stomach/Bowel: Unremarkable  stomach, small bowel and colon. Surgically absent appendix. Vascular/Lymphatic: Minimal atheromatous arterial calcifications. No enlarged lymph nodes. Reproductive: Status post hysterectomy. No adnexal masses. Other: No abdominal wall hernia or abnormality. No abdominopelvic ascites. Musculoskeletal: Minimal lumbar and lower thoracic spine degenerative changes. No evidence of bony metastatic disease. IMPRESSION: 1. Interval 5 mm nonobstructing calculus at the left ureterovesical junction. 2. Two small nonobstructing left renal calculi. 3. 5 mm exophytic mass arising from the posterior aspect of the mid to lower left kidney. This could represent a small solid mass or proteinaceous cyst. 4. Stable 11 x 8 mm nodule along the right hemidiaphragm except no associated fat density currently demonstrated. Electronically Signed   By: Claudie Revering M.D.   On: 01/17/2020 15:58   VAS [place] UPPER EXTREMITY VENOUS DUPLEX  Result Date: 02/14/2020 UPPER VENOUS STUDY  Indications: Edema Comparison Study: 11/16/2019-negative right upper extremity venous duplex Performing Technologist: Maudry Mayhew MHA, RDMS, RVT, RDCS  Examination Guidelines: A complete evaluation includes B-mode imaging, spectral Doppler, color Doppler, and power Doppler as needed of all accessible portions of each vessel. Bilateral testing is considered an integral part of a complete examination. Limited examinations for reoccurring indications may be performed as noted.  Right Findings: +----------+------------+---------+-----------+----------+-------+ RIGHT     CompressiblePhasicitySpontaneousPropertiesSummary +----------+------------+---------+-----------+----------+-------+ IJV           Full       Yes       Yes                      +----------+------------+---------+-----------+----------+-------+ Subclavian    Full       Yes       Yes                      +----------+------------+---------+-----------+----------+-------+ Axillary       Full  Yes       Yes                      +----------+------------+---------+-----------+----------+-------+ Brachial      Full       Yes       Yes                      +----------+------------+---------+-----------+----------+-------+ Radial        Full                                          +----------+------------+---------+-----------+----------+-------+ Ulnar         Full                                          +----------+------------+---------+-----------+----------+-------+ Cephalic      Full                                          +----------+------------+---------+-----------+----------+-------+ Basilic       Full                                          +----------+------------+---------+-----------+----------+-------+  Left Findings: +----------+------------+---------+-----------+----------+--------------+ LEFT      CompressiblePhasicitySpontaneousProperties   Summary     +----------+------------+---------+-----------+----------+--------------+ IJV           Full       Yes       Yes                             +----------+------------+---------+-----------+----------+--------------+ Subclavian    Full       Yes       Yes                             +----------+------------+---------+-----------+----------+--------------+ Axillary      Full       Yes       Yes                             +----------+------------+---------+-----------+----------+--------------+ Brachial      Full       Yes       Yes                             +----------+------------+---------+-----------+----------+--------------+ Radial        Full                                                 +----------+------------+---------+-----------+----------+--------------+ Ulnar         Full                                                 +----------+------------+---------+-----------+----------+--------------+  Cephalic      Full                                                  +----------+------------+---------+-----------+----------+--------------+ Basilic                                             Not visualized +----------+------------+---------+-----------+----------+--------------+  Summary:  Right: No evidence of deep vein thrombosis in the upper extremity. No evidence of superficial vein thrombosis in the upper extremity.  Left: No evidence of deep vein thrombosis in the upper extremity. No evidence of superficial vein thrombosis in the upper extremity.  *See table(s) above for measurements and observations.  Diagnosing physician: Ruta Hinds MD Electronically signed by Ruta Hinds MD on 02/14/2020 at 8:54:09 AM.    Final      Subjective: "I tried to get them another sample."  Discharge Exam: Vitals:   02/14/20 0627 02/14/20 0853  BP: (!) 137/91   Pulse: 80 78  Resp: 17 18  Temp: (!) 97.4 F (36.3 C)   SpO2: 100% 100%   Vitals:   02/13/20 2011 02/13/20 2205 02/14/20 0627 02/14/20 0853  BP:  113/74 (!) 137/91   Pulse:  90 80 78  Resp:  _0 Temp:  97.7 F (36.5 C) (!) 97.4 F (36.3 C)   TempSrc:  Oral Oral   SpO2: 99% 100% 100% 100%  Weight:      Height:        General: 49 y.o. female resting in bed in NAD Cardiovascular: RRR, +S1, S2, no m/g/r Respiratory: examined prior to breathing treatment and her air movement is good, coursness in her cough is improved, she is again having full conversations w/o difficulty GI: BS+, NDNT, no masses noted, no organomegaly noted MSK: No e/c/c; BUE edema improved Neuro: A&O x 3, no focal deficits Psyc: Appropriate interaction and affect, calm/cooperative   The results of significant diagnostics from this hospitalization (including imaging, microbiology, ancillary and laboratory) are listed below for reference.     Microbiology: Recent Results (from the past 240 hour(s))  SARS Coronavirus 2 by RT PCR (hospital order, performed in [facility]  hospital lab) Nasopharyngeal Nasopharyngeal Swab     Status: None   Collection Time: 02/10/20  8:47 PM   Specimen: Nasopharyngeal Swab  Result Value Ref Range Status   SARS Coronavirus 2 NEGATIVE NEGATIVE Final    Comment: (NOTE) SARS-CoV-2 target nucleic acids are NOT DETECTED. The SARS-CoV-2 RNA is generally detectable in upper and lower respiratory specimens during the acute phase of infection. The lowest concentration of SARS-CoV-2 viral copies this assay can detect is 250 copies / mL. A negative result does not preclude SARS-CoV-2 infection and should not be used as the sole basis for treatment or other patient management decisions.  A negative result may occur with improper specimen collection / handling, submission of specimen other than nasopharyngeal swab, presence of viral mutation(s) within the areas targeted by this assay, and inadequate number of viral copies (<250 copies / mL). A negative result must be combined with clinical observations, patient history, and epidemiological information. Fact Sheet for Patients:   StrictlyIdeas.no Fact Sheet for Healthcare Providers: BankingDealers.co.za This test is not yet approved or cleared  by the [place] and has been authorized for detection and/or diagnosis of SARS-CoV-2 by FDA under an Emergency Use Authorization (EUA).  This EUA will remain in effect (meaning this test can be used) for the duration of the COVID-19 declaration under Section 564(b)(1) of the Act, 21 U.S.C. section 360bbb-3(b)(1), unless the authorization is terminated or revoked sooner. Performed at [facility], [place] [address] [place], [place] [address]   Expectorated sputum assessment w rflx to resp cult     Status: None   Collection Time: 02/12/20  1:41 PM   Specimen: Sputum  Result Value Ref Range Status   Specimen Description SPUTUM  Final   Special Requests NONE  Final    Sputum evaluation   Final    THIS SPECIMEN IS ACCEPTABLE FOR SPUTUM CULTURE Performed at [facility], [place] [address], [place], [place] [address]    Report Status 02/12/2020 FINAL  Final  Culture, respiratory     Status: None (Preliminary result)   Collection Time: 02/12/20  1:41 PM   Specimen: SPU  Result Value Ref Range Status   Specimen Description   Final    SPUTUM Performed at [place] [address], [place], [place] [address]    Special Requests   Final    NONE Reflexed from [id number] Performed at [facility], [place] [address] [place], [place] [address]    Gram Stain   Final    RARE WBC PRESENT, PREDOMINANTLY PMN FEW SQUAMOUS EPITHELIAL CELLS PRESENT FEW BUDDING YEAST SEEN RARE GRAM POSITIVE COCCI IN PAIRS RARE GRAM VARIABLE ROD Performed at [facility], [place] [address] [place], [place] [address]    Culture MODERATE YEAST  Final   Report Status PENDING  Incomplete  Respiratory Panel by PCR     Status: None   Collection Time: 02/12/20  8:00 PM   Specimen: Nasopharyngeal Swab; Respiratory  Result Value Ref Range Status   Adenovirus NOT DETECTED NOT DETECTED Final   Coronavirus 229E NOT DETECTED NOT DETECTED Final    Comment: (NOTE) The Coronavirus on the Respiratory Panel, DOES NOT test for the novel  Coronavirus (2019 nCoV)    Coronavirus HKU1 NOT DETECTED NOT DETECTED Final   Coronavirus NL63 NOT DETECTED NOT DETECTED Final   Coronavirus OC43 NOT DETECTED NOT DETECTED Final   Metapneumovirus NOT DETECTED NOT DETECTED Final   Rhinovirus / Enterovirus NOT DETECTED NOT DETECTED Final   Influenza A NOT DETECTED NOT DETECTED Final   Influenza B NOT DETECTED NOT DETECTED Final   Parainfluenza Virus 1 NOT DETECTED NOT DETECTED Final   Parainfluenza Virus 2 NOT DETECTED NOT DETECTED Final   Parainfluenza Virus 3 NOT DETECTED NOT DETECTED Final   Parainfluenza Virus 4 NOT DETECTED NOT DETECTED Final    Respiratory Syncytial Virus NOT DETECTED NOT DETECTED Final   Bordetella pertussis NOT DETECTED NOT DETECTED Final   Chlamydophila pneumoniae NOT DETECTED NOT DETECTED Final   Mycoplasma pneumoniae NOT DETECTED NOT DETECTED Final    Comment: Performed at [facility], [place] [address], [place],  [address]     Labs: BNP (last 3 results) Recent Labs    02/11/20 0530  BNP 40.8   Basic Metabolic Panel: Recent Labs  Lab 02/10/20 1020 02/11/20 0530 02/12/20 0805 02/13/20 0611 02/14/20 0500  NA 141 141 141 139 143  K 3.4* 3.9 3.6 3.6 3.7  CL 106 104 108 108 105  CO2 _0 29  GLUCOSE 98 103* 107* 110* 99  BUN _0 CREATININE 0.99 1.03* 0.90 0.91 0.85  CALCIUM 8.4* 8.5* 8.6* 9.0 9.3  MG  --   --  2.0 2.0 2.1  PHOS  --   --  2.6 3.7 4.2   Liver Function Tests: Recent Labs  Lab 02/10/20 1020 02/11/20 0530 02/12/20 0805 02/13/20 0611 02/14/20 0500  AST 36 35  --   --   --   ALT 43 36  --   --   --   ALKPHOS 59 47  --   --   --   BILITOT 0.4 0.6  --   --   --   PROT 7.3 6.5  --   --   --   ALBUMIN 3.7 3.5 3.5 3.7 3.9   No results for input(s): LIPASE, AMYLASE in the last 168 hours. No results for input(s): AMMONIA in the last 168 hours. CBC: Recent Labs  Lab 02/10/20 1020 02/11/20 0530 02/12/20 0805 02/13/20 0611 02/14/20 0500  WBC 7.1 5.7 8.6 8.2 8.4  NEUTROABS 3.7  --  5.4 5.3 5.1  HGB 12.7 11.2* 10.9* 10.7* 10.5*  HCT 39.3 35.6* 35.1* 34.9* 33.5*  MCV 91.4 93.0 94.1 94.8 94.4  PLT 297 269 265 271 259   Cardiac Enzymes: No results for input(s): CKTOTAL, CKMB, CKMBINDEX, TROPONINI in the last 168 hours. BNP: Invalid input(s): POCBNP CBG: No results for input(s): GLUCAP in the last 168 hours. D-Dimer No results for input(s): DDIMER in the last 72 hours. Hgb A1c No results for input(s): HGBA1C in the last 72 hours. Lipid Profile No results for input(s): CHOL, HDL, LDLCALC, TRIG, CHOLHDL, LDLDIRECT in the last 72  hours. Thyroid function studies No results for input(s): TSH, T4TOTAL, T3FREE, THYROIDAB in the last 72 hours.  Invalid input(s): FREET3 Anemia work up No results for input(s): VITAMINB12, FOLATE, FERRITIN, TIBC, IRON, RETICCTPCT in the last 72 hours. Urinalysis    Component Value Date/Time   COLORURINE STRAW (A) 01/17/2020 1440   APPEARANCEUR CLEAR 01/17/2020 1440   LABSPEC 1.009 01/17/2020 1440   PHURINE 6.0 01/17/2020 1440   GLUCOSEU NEGATIVE 01/17/2020 1440   HGBUR NEGATIVE 01/17/2020 1440   BILIRUBINUR NEGATIVE 01/17/2020 1440   KETONESUR NEGATIVE 01/17/2020 1440   PROTEINUR NEGATIVE 01/17/2020 1440   UROBILINOGEN 1.0 06/16/2015 1045   NITRITE NEGATIVE 01/17/2020 1440   LEUKOCYTESUR NEGATIVE 01/17/2020 1440   Sepsis Labs Invalid input(s): PROCALCITONIN,  WBC,  LACTICIDVEN Microbiology Recent Results (from the past 240 hour(s))  SARS Coronavirus 2 by RT PCR (hospital order, performed in [place] hospital lab) Nasopharyngeal Nasopharyngeal Swab     Status: None   Collection Time: 02/10/20  8:47 PM   Specimen: Nasopharyngeal Swab  Result Value Ref Range Status   SARS Coronavirus 2 NEGATIVE NEGATIVE Final    Comment: (NOTE) SARS-CoV-2 target nucleic acids are NOT DETECTED. The SARS-CoV-2 RNA is generally detectable in upper and lower respiratory specimens during the acute phase of infection. The lowest concentration of SARS-CoV-2 viral copies this assay can detect is 250 copies / mL. A negative result does not preclude SARS-CoV-2 infection and should not be used as the sole basis for treatment or other patient management decisions.  A negative result may occur with improper specimen collection / handling, submission of specimen other than nasopharyngeal swab, presence of viral mutation(s) within the areas targeted by this assay, and inadequate number of viral copies (<250 copies / mL). A negative result must be combined with  clinical observations, patient history, and  epidemiological information. Fact Sheet for Patients:   StrictlyIdeas.no Fact Sheet for Healthcare Providers: BankingDealers.co.za This test is not yet approved or cleared  by the [place] FDA and has been authorized for detection and/or diagnosis of SARS-CoV-2 by FDA under an Emergency Use Authorization (EUA).  This EUA will remain in effect (meaning this test can be used) for the duration of the COVID-19 declaration under Section 564(b)(1) of the Act, 21 U.S.C. section 360bbb-3(b)(1), unless the authorization is terminated or revoked sooner. Performed at [facility], [place] [address], [place], [place] [address]   Expectorated sputum assessment w rflx to resp cult     Status: None   Collection Time: 02/12/20  1:41 PM   Specimen: Sputum  Result Value Ref Range Status   Specimen Description SPUTUM  Final   Special Requests NONE  Final   Sputum evaluation   Final    THIS SPECIMEN IS ACCEPTABLE FOR SPUTUM CULTURE Performed at [facility], [place] [address] [place], [place] [address]    Report Status 02/12/2020 FINAL  Final  Culture, respiratory     Status: None (Preliminary result)   Collection Time: 02/12/20  1:41 PM   Specimen: SPU  Result Value Ref Range Status   Specimen Description   Final    SPUTUM Performed at [place] [address] [place], [place] [address]    Special Requests   Final    NONE Reflexed from [id number] Performed at [facility], [place] [address] [place], [place] [address]    Gram Stain   Final    RARE WBC PRESENT, PREDOMINANTLY PMN FEW SQUAMOUS EPITHELIAL CELLS PRESENT FEW BUDDING YEAST SEEN RARE GRAM POSITIVE COCCI IN PAIRS RARE GRAM VARIABLE ROD Performed at [facility], [place] [address], [place], [place] [address]    Culture MODERATE YEAST  Final   Report Status PENDING  Incomplete  Respiratory Panel by PCR      Status: None   Collection Time: 02/12/20  8:00 PM   Specimen: Nasopharyngeal Swab; Respiratory  Result Value Ref Range Status   Adenovirus NOT DETECTED NOT DETECTED Final   Coronavirus 229E NOT DETECTED NOT DETECTED Final    Comment: (NOTE) The Coronavirus on the Respiratory Panel, DOES NOT test for the novel  Coronavirus (2019 nCoV)    Coronavirus HKU1 NOT DETECTED NOT DETECTED Final   Coronavirus NL63 NOT DETECTED NOT DETECTED Final   Coronavirus OC43 NOT DETECTED NOT DETECTED Final   Metapneumovirus NOT DETECTED NOT DETECTED Final   Rhinovirus / Enterovirus NOT DETECTED NOT DETECTED Final   Influenza A NOT DETECTED NOT DETECTED Final   Influenza B NOT DETECTED NOT DETECTED Final   Parainfluenza Virus 1 NOT DETECTED NOT DETECTED Final   Parainfluenza Virus 2 NOT DETECTED NOT DETECTED Final   Parainfluenza Virus 3 NOT DETECTED NOT DETECTED Final   Parainfluenza Virus 4 NOT DETECTED NOT DETECTED Final   Respiratory Syncytial Virus NOT DETECTED NOT DETECTED Final   Bordetella pertussis NOT DETECTED NOT DETECTED Final   Chlamydophila pneumoniae NOT DETECTED NOT DETECTED Final   Mycoplasma pneumoniae NOT DETECTED NOT DETECTED Final    Comment: Performed at [facility], [place] [address], [place],  [address]     Time coordinating discharge: 35 minutes  SIGNED:   Jonnie Finner, DO  [facility] 02/14/2020, 12:52 PM   If 7PM-7AM, please contact night-coverage [email or web address]

## 2020-02-15 ENCOUNTER — Telehealth: Payer: Self-pay | Admitting: Hematology and Oncology

## 2020-02-15 ENCOUNTER — Inpatient Hospital Stay: Payer: BC Managed Care – PPO | Admitting: Hematology and Oncology

## 2020-02-15 LAB — MPO/PR-3 (ANCA) ANTIBODIES
ANCA Proteinase 3: <3.5 U/mL (ref 0.0–3.5)
Myeloperoxidase Abs: <9 U/mL (ref 0.0–9.0)

## 2020-02-15 LAB — IGM: IgM (Immunoglobulin M), Srm: 102 mg/dL (ref 26–217)

## 2020-02-15 LAB — ANCA TITERS
Atypical P-ANCA titer: <1:20 {titer}
C-ANCA: <1:20 {titer}
P-ANCA: <1:20 {titer}

## 2020-02-15 LAB — LEGIONELLA PNEUMOPHILA SEROGP 1 UR AG: L. pneumophila Serogp 1 Ur Ag: NEGATIVE

## 2020-02-15 LAB — CULTURE, RESPIRATORY W GRAM STAIN

## 2020-02-15 LAB — IGA: IgA: 202 mg/dL (ref 87–352)

## 2020-02-15 LAB — IGG: IgG (Immunoglobin G), Serum: 937 mg/dL (ref 586–1602)

## 2020-02-15 NOTE — Telephone Encounter (Signed)
Scheduled appt per 5/17 sch message - unable to reach pt .left message with appt date and time

## 2020-02-18 LAB — ALLERGENS W/TOTAL IGE AREA 2
Alternaria Alternata IgE: <0.1 kU/L
Aspergillus Fumigatus IgE: <0.1 kU/L
Bermuda Grass IgE: <0.1 kU/L
Cat Dander IgE: <0.1 kU/L
Cedar, Mountain IgE: <0.1 kU/L
Cladosporium Herbarum IgE: <0.1 kU/L
Cockroach, German IgE: <0.1 kU/L
Common Silver Birch IgE: <0.1 kU/L
Cottonwood IgE: <0.1 kU/L
D Farinae IgE: <0.1 kU/L
D Pteronyssinus IgE: <0.1 kU/L
Dog Dander IgE: <0.1 kU/L
Elm, American IgE: <0.1 kU/L
IgE (Immunoglobulin E), Serum: 3 IU/mL — ABNORMAL LOW (ref 6–495)
Johnson Grass IgE: <0.1 kU/L
Maple/Box Elder IgE: <0.1 kU/L
Mouse Urine IgE: <0.1 kU/L
Oak, White IgE: <0.1 kU/L
Pecan, Hickory IgE: <0.1 kU/L
Penicillium Chrysogen IgE: <0.1 kU/L
Pigweed, Rough IgE: <0.1 kU/L
Ragweed, Short IgE: <0.1 kU/L
Sheep Sorrel IgE Qn: <0.1 kU/L
Timothy Grass IgE: <0.1 kU/L
White Mulberry IgE: <0.1 kU/L

## 2020-02-21 ENCOUNTER — Other Ambulatory Visit: Payer: Self-pay | Admitting: Hematology and Oncology

## 2020-02-21 DIAGNOSIS — R110 Nausea: Secondary | ICD-10-CM

## 2020-02-22 NOTE — Telephone Encounter (Signed)
I wasn't sure if you wanted to continue refilling this.

## 2020-02-23 ENCOUNTER — Other Ambulatory Visit: Payer: Self-pay

## 2020-02-23 ENCOUNTER — Inpatient Hospital Stay: Payer: BC Managed Care – PPO

## 2020-02-23 ENCOUNTER — Other Ambulatory Visit: Payer: Self-pay | Admitting: Medical

## 2020-02-23 DIAGNOSIS — Z8571 Personal history of Hodgkin lymphoma: Secondary | ICD-10-CM | POA: Diagnosis not present

## 2020-02-23 DIAGNOSIS — R05 Cough: Secondary | ICD-10-CM | POA: Diagnosis not present

## 2020-02-23 DIAGNOSIS — E860 Dehydration: Secondary | ICD-10-CM | POA: Diagnosis not present

## 2020-02-23 DIAGNOSIS — R5383 Other fatigue: Secondary | ICD-10-CM | POA: Diagnosis not present

## 2020-02-23 DIAGNOSIS — R630 Anorexia: Secondary | ICD-10-CM | POA: Diagnosis not present

## 2020-02-23 DIAGNOSIS — I890 Lymphedema, not elsewhere classified: Secondary | ICD-10-CM | POA: Diagnosis not present

## 2020-02-23 DIAGNOSIS — N289 Disorder of kidney and ureter, unspecified: Secondary | ICD-10-CM | POA: Diagnosis not present

## 2020-02-23 DIAGNOSIS — R079 Chest pain, unspecified: Secondary | ICD-10-CM | POA: Diagnosis not present

## 2020-02-23 DIAGNOSIS — R2232 Localized swelling, mass and lump, left upper limb: Secondary | ICD-10-CM | POA: Diagnosis not present

## 2020-02-23 DIAGNOSIS — D649 Anemia, unspecified: Secondary | ICD-10-CM

## 2020-02-23 DIAGNOSIS — K219 Gastro-esophageal reflux disease without esophagitis: Secondary | ICD-10-CM | POA: Diagnosis not present

## 2020-02-23 DIAGNOSIS — I50810 Right heart failure, unspecified: Secondary | ICD-10-CM | POA: Diagnosis not present

## 2020-02-23 DIAGNOSIS — J9611 Chronic respiratory failure with hypoxia: Secondary | ICD-10-CM | POA: Diagnosis not present

## 2020-02-23 DIAGNOSIS — J029 Acute pharyngitis, unspecified: Secondary | ICD-10-CM | POA: Diagnosis not present

## 2020-02-23 DIAGNOSIS — R51 Headache with orthostatic component, not elsewhere classified: Secondary | ICD-10-CM | POA: Diagnosis not present

## 2020-02-23 DIAGNOSIS — Z95828 Presence of other vascular implants and grafts: Secondary | ICD-10-CM

## 2020-02-23 DIAGNOSIS — Z8589 Personal history of malignant neoplasm of other organs and systems: Secondary | ICD-10-CM | POA: Diagnosis not present

## 2020-02-23 DIAGNOSIS — I471 Supraventricular tachycardia: Secondary | ICD-10-CM | POA: Diagnosis not present

## 2020-02-23 DIAGNOSIS — I350 Nonrheumatic aortic (valve) stenosis: Secondary | ICD-10-CM | POA: Diagnosis not present

## 2020-02-23 DIAGNOSIS — Z8541 Personal history of malignant neoplasm of cervix uteri: Secondary | ICD-10-CM | POA: Diagnosis not present

## 2020-02-23 DIAGNOSIS — Z452 Encounter for adjustment and management of vascular access device: Secondary | ICD-10-CM | POA: Diagnosis not present

## 2020-02-23 DIAGNOSIS — Z85850 Personal history of malignant neoplasm of thyroid: Secondary | ICD-10-CM | POA: Diagnosis not present

## 2020-02-23 DIAGNOSIS — Z86718 Personal history of other venous thrombosis and embolism: Secondary | ICD-10-CM | POA: Diagnosis not present

## 2020-02-23 DIAGNOSIS — R635 Abnormal weight gain: Secondary | ICD-10-CM | POA: Diagnosis not present

## 2020-02-23 DIAGNOSIS — E876 Hypokalemia: Secondary | ICD-10-CM

## 2020-02-23 DIAGNOSIS — Z79899 Other long term (current) drug therapy: Secondary | ICD-10-CM | POA: Diagnosis not present

## 2020-02-23 DIAGNOSIS — I110 Hypertensive heart disease with heart failure: Secondary | ICD-10-CM | POA: Diagnosis not present

## 2020-02-23 MED ORDER — HEPARIN SOD (PORK) LOCK FLUSH 100 UNIT/ML IV SOLN
500.0000 [IU] | Freq: Once | INTRAVENOUS | Status: AC | PRN
  Administered 2020-02-23: 500 [IU]
  Filled 2020-02-23: qty 5

## 2020-02-23 MED ORDER — SODIUM CHLORIDE 0.9% FLUSH
10.0000 mL | INTRAVENOUS | Status: DC | PRN
  Administered 2020-02-23: 10 mL
  Filled 2020-02-23: qty 10

## 2020-02-23 NOTE — Progress Notes (Signed)
Patient presented to infusion area with c/o pain surrounding PAC since discharged from hospital. Patient requesting to have PAC accessed and flushed. Discussed with Sandi Mealy, PA-C who advised ok to access. Accessed with no difficulty or signs of abnormality. Patient will be seen for labs and visit in Symptom Management tomorrow morning. In basket message sent to scheduling team.

## 2020-02-24 ENCOUNTER — Inpatient Hospital Stay: Payer: BC Managed Care – PPO

## 2020-02-24 ENCOUNTER — Encounter: Payer: Self-pay | Admitting: Medical

## 2020-02-24 ENCOUNTER — Inpatient Hospital Stay: Payer: BC Managed Care – PPO | Admitting: Medical

## 2020-02-24 ENCOUNTER — Telehealth: Payer: Self-pay | Admitting: Emergency Medicine

## 2020-02-24 NOTE — Telephone Encounter (Signed)
Called pt regarding missed [name] appts this am for port issue f/u, no answer.  Left VM requesting call back to f/u on port issue and missed appts.

## 2020-02-25 ENCOUNTER — Other Ambulatory Visit: Payer: BC Managed Care – PPO

## 2020-02-25 ENCOUNTER — Encounter: Payer: BC Managed Care – PPO | Admitting: Medical

## 2020-02-26 ENCOUNTER — Encounter: Payer: Self-pay | Admitting: Emergency Medicine

## 2020-02-26 DIAGNOSIS — R911 Solitary pulmonary nodule: Secondary | ICD-10-CM | POA: Diagnosis not present

## 2020-03-01 ENCOUNTER — Telehealth: Payer: Self-pay | Admitting: Emergency Medicine

## 2020-03-01 ENCOUNTER — Encounter: Payer: BC Managed Care – PPO | Admitting: Medical

## 2020-03-01 NOTE — Telephone Encounter (Signed)
Called pt per MyChart request to call regarding scheduling [facility] [place] appt today, no answer.  Left VM requesting call back.

## 2020-03-08 ENCOUNTER — Telehealth: Payer: Self-pay | Admitting: *Deleted

## 2020-03-08 NOTE — Telephone Encounter (Signed)
Pt left a message stating she saw her dermatologist and he feel she needs to follow up with Dr Lindi Adie as he is her hematologist. She has 2 blisters on each hand, she has been using silvadene cream and colchicine. Is experiencing excruciating pain. Does not know if she needs to be referred to a vascular surgeon.

## 2020-03-09 ENCOUNTER — Other Ambulatory Visit: Payer: Self-pay | Admitting: Hematology and Oncology

## 2020-03-09 ENCOUNTER — Other Ambulatory Visit: Payer: Self-pay | Admitting: *Deleted

## 2020-03-09 DIAGNOSIS — I776 Arteritis, unspecified: Secondary | ICD-10-CM

## 2020-03-09 DIAGNOSIS — Z86000 Personal history of in-situ neoplasm of breast: Secondary | ICD-10-CM

## 2020-03-09 NOTE — Progress Notes (Signed)
Per MD request referral placed to Dr. Iran Planas [phone]) for plastic surgery of ruptures breast implants.  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

## 2020-03-10 ENCOUNTER — Telehealth: Payer: Self-pay | Admitting: Hematology and Oncology

## 2020-03-10 NOTE — Telephone Encounter (Signed)
Not sure if you would want to refill this.  Looks like we have never prescribed it to her the hospitalist did.  Please deny the prescription if pt needs to have it refilled by her PCP.

## 2020-03-10 NOTE — Telephone Encounter (Signed)
Scheduled apt per 6/09 sch message - pt is aware.

## 2020-03-15 ENCOUNTER — Inpatient Hospital Stay: Payer: BC Managed Care – PPO | Attending: Hematology and Oncology

## 2020-03-15 ENCOUNTER — Other Ambulatory Visit: Payer: Self-pay

## 2020-03-15 DIAGNOSIS — Z8571 Personal history of Hodgkin lymphoma: Secondary | ICD-10-CM | POA: Diagnosis not present

## 2020-03-15 DIAGNOSIS — Z452 Encounter for adjustment and management of vascular access device: Secondary | ICD-10-CM | POA: Insufficient documentation

## 2020-03-15 DIAGNOSIS — Z95828 Presence of other vascular implants and grafts: Secondary | ICD-10-CM

## 2020-03-15 MED ORDER — SODIUM CHLORIDE 0.9% FLUSH
10.0000 mL | INTRAVENOUS | Status: DC | PRN
  Administered 2020-03-15: 10 mL
  Filled 2020-03-15: qty 10

## 2020-03-15 MED ORDER — HEPARIN SOD (PORK) LOCK FLUSH 100 UNIT/ML IV SOLN
500.0000 [IU] | Freq: Once | INTRAVENOUS | Status: AC | PRN
  Administered 2020-03-15: 500 [IU]
  Filled 2020-03-15: qty 5

## 2020-03-28 DIAGNOSIS — R911 Solitary pulmonary nodule: Secondary | ICD-10-CM | POA: Diagnosis not present

## 2020-03-31 ENCOUNTER — Encounter: Payer: Self-pay | Admitting: Medical

## 2020-04-01 ENCOUNTER — Other Ambulatory Visit: Payer: Self-pay

## 2020-04-01 ENCOUNTER — Emergency Department (HOSPITAL_BASED_OUTPATIENT_CLINIC_OR_DEPARTMENT_OTHER): Payer: BC Managed Care – PPO

## 2020-04-01 ENCOUNTER — Encounter (HOSPITAL_COMMUNITY): Payer: Self-pay | Admitting: Emergency Medicine

## 2020-04-01 ENCOUNTER — Telehealth: Payer: Self-pay | Admitting: Emergency Medicine

## 2020-04-01 ENCOUNTER — Inpatient Hospital Stay: Payer: BC Managed Care – PPO | Attending: Hematology and Oncology | Admitting: Medical

## 2020-04-01 ENCOUNTER — Emergency Department (HOSPITAL_COMMUNITY): Payer: BC Managed Care – PPO

## 2020-04-01 ENCOUNTER — Inpatient Hospital Stay (HOSPITAL_COMMUNITY)
Admission: EM | Admit: 2020-04-01 | Discharge: 2020-04-10 | DRG: 603 | Disposition: A | Discharge status: Home Health | Payer: BC Managed Care – PPO | Attending: Internal Medicine | Admitting: Internal Medicine

## 2020-04-01 ENCOUNTER — Telehealth: Payer: Self-pay | Admitting: *Deleted

## 2020-04-01 VITALS — BP 118/79 | HR 103 | Temp 98.7°F | Resp 17 | Ht 65.0 in | Wt 198.1 lb

## 2020-04-01 DIAGNOSIS — C73 Malignant neoplasm of thyroid gland: Secondary | ICD-10-CM

## 2020-04-01 DIAGNOSIS — I2781 Cor pulmonale (chronic): Secondary | ICD-10-CM | POA: Diagnosis present

## 2020-04-01 DIAGNOSIS — R7881 Bacteremia: Secondary | ICD-10-CM | POA: Diagnosis present

## 2020-04-01 DIAGNOSIS — G8929 Other chronic pain: Secondary | ICD-10-CM | POA: Diagnosis present

## 2020-04-01 DIAGNOSIS — G4700 Insomnia, unspecified: Secondary | ICD-10-CM | POA: Diagnosis present

## 2020-04-01 DIAGNOSIS — D849 Immunodeficiency, unspecified: Secondary | ICD-10-CM | POA: Diagnosis not present

## 2020-04-01 DIAGNOSIS — Z888 Allergy status to other drugs, medicaments and biological substances status: Secondary | ICD-10-CM

## 2020-04-01 DIAGNOSIS — I890 Lymphedema, not elsewhere classified: Secondary | ICD-10-CM | POA: Diagnosis present

## 2020-04-01 DIAGNOSIS — I424 Endocardial fibroelastosis: Secondary | ICD-10-CM | POA: Diagnosis not present

## 2020-04-01 DIAGNOSIS — J4550 Severe persistent asthma, uncomplicated: Secondary | ICD-10-CM | POA: Diagnosis present

## 2020-04-01 DIAGNOSIS — R600 Localized edema: Secondary | ICD-10-CM

## 2020-04-01 DIAGNOSIS — I5022 Chronic systolic (congestive) heart failure: Secondary | ICD-10-CM | POA: Diagnosis not present

## 2020-04-01 DIAGNOSIS — Z20822 Contact with and (suspected) exposure to covid-19: Secondary | ICD-10-CM | POA: Diagnosis not present

## 2020-04-01 DIAGNOSIS — Z885 Allergy status to narcotic agent status: Secondary | ICD-10-CM

## 2020-04-01 DIAGNOSIS — Z91018 Allergy to other foods: Secondary | ICD-10-CM

## 2020-04-01 DIAGNOSIS — Z8541 Personal history of malignant neoplasm of cervix uteri: Secondary | ICD-10-CM

## 2020-04-01 DIAGNOSIS — L03114 Cellulitis of left upper limb: Principal | ICD-10-CM | POA: Diagnosis present

## 2020-04-01 DIAGNOSIS — R110 Nausea: Secondary | ICD-10-CM | POA: Diagnosis not present

## 2020-04-01 DIAGNOSIS — I7300 Raynaud's syndrome without gangrene: Secondary | ICD-10-CM

## 2020-04-01 DIAGNOSIS — B373 Candidiasis of vulva and vagina: Secondary | ICD-10-CM | POA: Diagnosis present

## 2020-04-01 DIAGNOSIS — R7303 Prediabetes: Secondary | ICD-10-CM | POA: Diagnosis present

## 2020-04-01 DIAGNOSIS — Z9013 Acquired absence of bilateral breasts and nipples: Secondary | ICD-10-CM

## 2020-04-01 DIAGNOSIS — I428 Other cardiomyopathies: Secondary | ICD-10-CM | POA: Diagnosis not present

## 2020-04-01 DIAGNOSIS — M109 Gout, unspecified: Secondary | ICD-10-CM | POA: Diagnosis present

## 2020-04-01 DIAGNOSIS — F329 Major depressive disorder, single episode, unspecified: Secondary | ICD-10-CM | POA: Diagnosis not present

## 2020-04-01 DIAGNOSIS — Z9049 Acquired absence of other specified parts of digestive tract: Secondary | ICD-10-CM

## 2020-04-01 DIAGNOSIS — R531 Weakness: Secondary | ICD-10-CM

## 2020-04-01 DIAGNOSIS — T451X5A Adverse effect of antineoplastic and immunosuppressive drugs, initial encounter: Secondary | ICD-10-CM | POA: Diagnosis present

## 2020-04-01 DIAGNOSIS — I10 Essential (primary) hypertension: Secondary | ICD-10-CM | POA: Diagnosis not present

## 2020-04-01 DIAGNOSIS — Z8571 Personal history of Hodgkin lymphoma: Secondary | ICD-10-CM

## 2020-04-01 DIAGNOSIS — J9811 Atelectasis: Secondary | ICD-10-CM | POA: Diagnosis not present

## 2020-04-01 DIAGNOSIS — E2740 Unspecified adrenocortical insufficiency: Secondary | ICD-10-CM | POA: Diagnosis present

## 2020-04-01 DIAGNOSIS — E876 Hypokalemia: Secondary | ICD-10-CM | POA: Diagnosis present

## 2020-04-01 DIAGNOSIS — R509 Fever, unspecified: Secondary | ICD-10-CM

## 2020-04-01 DIAGNOSIS — N200 Calculus of kidney: Secondary | ICD-10-CM

## 2020-04-01 DIAGNOSIS — Z85828 Personal history of other malignant neoplasm of skin: Secondary | ICD-10-CM

## 2020-04-01 DIAGNOSIS — Z86000 Personal history of in-situ neoplasm of breast: Secondary | ICD-10-CM | POA: Diagnosis present

## 2020-04-01 DIAGNOSIS — Z7982 Long term (current) use of aspirin: Secondary | ICD-10-CM

## 2020-04-01 DIAGNOSIS — M79609 Pain in unspecified limb: Secondary | ICD-10-CM | POA: Diagnosis not present

## 2020-04-01 DIAGNOSIS — M810 Age-related osteoporosis without current pathological fracture: Secondary | ICD-10-CM | POA: Diagnosis present

## 2020-04-01 DIAGNOSIS — Z79890 Hormone replacement therapy (postmenopausal): Secondary | ICD-10-CM

## 2020-04-01 DIAGNOSIS — Z9981 Dependence on supplemental oxygen: Secondary | ICD-10-CM | POA: Diagnosis not present

## 2020-04-01 DIAGNOSIS — I5082 Biventricular heart failure: Secondary | ICD-10-CM | POA: Diagnosis present

## 2020-04-01 DIAGNOSIS — I350 Nonrheumatic aortic (valve) stenosis: Secondary | ICD-10-CM | POA: Diagnosis present

## 2020-04-01 DIAGNOSIS — Z89022 Acquired absence of left finger(s): Secondary | ICD-10-CM

## 2020-04-01 DIAGNOSIS — E8770 Fluid overload, unspecified: Secondary | ICD-10-CM | POA: Diagnosis not present

## 2020-04-01 DIAGNOSIS — I130 Hypertensive heart and chronic kidney disease with heart failure and stage 1 through stage 4 chronic kidney disease, or unspecified chronic kidney disease: Secondary | ICD-10-CM | POA: Diagnosis present

## 2020-04-01 DIAGNOSIS — J9611 Chronic respiratory failure with hypoxia: Secondary | ICD-10-CM | POA: Diagnosis not present

## 2020-04-01 DIAGNOSIS — N182 Chronic kidney disease, stage 2 (mild): Secondary | ICD-10-CM | POA: Diagnosis present

## 2020-04-01 DIAGNOSIS — R112 Nausea with vomiting, unspecified: Secondary | ICD-10-CM

## 2020-04-01 DIAGNOSIS — G43909 Migraine, unspecified, not intractable, without status migrainosus: Secondary | ICD-10-CM | POA: Diagnosis present

## 2020-04-01 DIAGNOSIS — Z9221 Personal history of antineoplastic chemotherapy: Secondary | ICD-10-CM

## 2020-04-01 DIAGNOSIS — Z86018 Personal history of other benign neoplasm: Secondary | ICD-10-CM | POA: Diagnosis present

## 2020-04-01 DIAGNOSIS — Z923 Personal history of irradiation: Secondary | ICD-10-CM

## 2020-04-01 DIAGNOSIS — Z95828 Presence of other vascular implants and grafts: Secondary | ICD-10-CM

## 2020-04-01 DIAGNOSIS — I776 Arteritis, unspecified: Secondary | ICD-10-CM | POA: Diagnosis present

## 2020-04-01 DIAGNOSIS — E271 Primary adrenocortical insufficiency: Secondary | ICD-10-CM

## 2020-04-01 DIAGNOSIS — F419 Anxiety disorder, unspecified: Secondary | ICD-10-CM | POA: Diagnosis not present

## 2020-04-01 DIAGNOSIS — R0602 Shortness of breath: Secondary | ICD-10-CM | POA: Diagnosis not present

## 2020-04-01 DIAGNOSIS — I700 Atherosclerosis of aorta: Secondary | ICD-10-CM | POA: Diagnosis not present

## 2020-04-01 DIAGNOSIS — R222 Localized swelling, mass and lump, trunk: Secondary | ICD-10-CM

## 2020-04-01 DIAGNOSIS — Z9882 Breast implant status: Secondary | ICD-10-CM

## 2020-04-01 DIAGNOSIS — Z7952 Long term (current) use of systemic steroids: Secondary | ICD-10-CM

## 2020-04-01 DIAGNOSIS — E272 Addisonian crisis: Secondary | ICD-10-CM | POA: Diagnosis not present

## 2020-04-01 DIAGNOSIS — Z853 Personal history of malignant neoplasm of breast: Secondary | ICD-10-CM

## 2020-04-01 DIAGNOSIS — R042 Hemoptysis: Secondary | ICD-10-CM | POA: Diagnosis not present

## 2020-04-01 DIAGNOSIS — K219 Gastro-esophageal reflux disease without esophagitis: Secondary | ICD-10-CM | POA: Diagnosis present

## 2020-04-01 DIAGNOSIS — R040 Epistaxis: Secondary | ICD-10-CM | POA: Diagnosis not present

## 2020-04-01 DIAGNOSIS — Z85850 Personal history of malignant neoplasm of thyroid: Secondary | ICD-10-CM | POA: Diagnosis present

## 2020-04-01 DIAGNOSIS — R52 Pain, unspecified: Secondary | ICD-10-CM

## 2020-04-01 DIAGNOSIS — Z66 Do not resuscitate: Secondary | ICD-10-CM | POA: Diagnosis not present

## 2020-04-01 DIAGNOSIS — Z87442 Personal history of urinary calculi: Secondary | ICD-10-CM

## 2020-04-01 DIAGNOSIS — R635 Abnormal weight gain: Secondary | ICD-10-CM

## 2020-04-01 DIAGNOSIS — Z79899 Other long term (current) drug therapy: Secondary | ICD-10-CM

## 2020-04-01 LAB — COMPREHENSIVE METABOLIC PANEL
ALT: 38 U/L (ref 0–44)
AST: 29 U/L (ref 15–41)
Albumin: 3.9 g/dL (ref 3.5–5.0)
Alkaline Phosphatase: 56 U/L (ref 38–126)
Anion gap: 15 (ref 5–15)
BUN: 19 mg/dL (ref 6–20)
CO2: 25 mmol/L (ref 22–32)
Calcium: 8.3 mg/dL — ABNORMAL LOW (ref 8.9–10.3)
Chloride: 100 mmol/L (ref 98–111)
Creatinine, Ser: 1.06 mg/dL — ABNORMAL HIGH (ref 0.44–1.00)
GFR calc Af Amer: >60 mL/min (ref >60)
GFR calc non Af Amer: >60 mL/min (ref >60)
Glucose, Bld: 109 mg/dL — ABNORMAL HIGH (ref 70–99)
Potassium: 3.3 mmol/L — ABNORMAL LOW (ref 3.5–5.1)
Sodium: 140 mmol/L (ref 135–145)
Total Bilirubin: 0.5 mg/dL (ref 0.3–1.2)
Total Protein: 7.3 g/dL (ref 6.5–8.1)

## 2020-04-01 LAB — CBC WITH DIFFERENTIAL/PLATELET
Abs Immature Granulocytes: 0.06 10*3/uL (ref 0.00–0.07)
Basophils Absolute: 0.1 10*3/uL (ref 0.0–0.1)
Basophils Relative: 1 %
Eosinophils Absolute: 0.1 10*3/uL (ref 0.0–0.5)
Eosinophils Relative: 1 %
HCT: 33.9 % — ABNORMAL LOW (ref 36.0–46.0)
Hemoglobin: 10.8 g/dL — ABNORMAL LOW (ref 12.0–15.0)
Immature Granulocytes: 1 %
Lymphocytes Relative: 18 %
Lymphs Abs: 1.7 10*3/uL (ref 0.7–4.0)
MCH: 29.8 pg (ref 26.0–34.0)
MCHC: 31.9 g/dL (ref 30.0–36.0)
MCV: 93.4 fL (ref 80.0–100.0)
Monocytes Absolute: 0.6 10*3/uL (ref 0.1–1.0)
Monocytes Relative: 7 %
Neutro Abs: 6.9 10*3/uL (ref 1.7–7.7)
Neutrophils Relative %: 72 %
Platelets: 287 10*3/uL (ref 150–400)
RBC: 3.63 MIL/uL — ABNORMAL LOW (ref 3.87–5.11)
RDW: 15.2 % (ref 11.5–15.5)
WBC: 9.3 10*3/uL (ref 4.0–10.5)
nRBC: 0 % (ref 0.0–0.2)

## 2020-04-01 LAB — URINALYSIS, ROUTINE W REFLEX MICROSCOPIC
Bilirubin Urine: NEGATIVE
Glucose, UA: NEGATIVE mg/dL
Hgb urine dipstick: NEGATIVE
Ketones, ur: NEGATIVE mg/dL
Leukocytes,Ua: NEGATIVE
Nitrite: NEGATIVE
Protein, ur: NEGATIVE mg/dL
Specific Gravity, Urine: 1.009 (ref 1.005–1.030)
pH: 6 (ref 5.0–8.0)

## 2020-04-01 LAB — MAGNESIUM: Magnesium: 2 mg/dL (ref 1.7–2.4)

## 2020-04-01 LAB — SARS CORONAVIRUS 2 BY RT PCR (HOSPITAL ORDER, PERFORMED IN ~~LOC~~ HOSPITAL LAB): SARS Coronavirus 2: NEGATIVE

## 2020-04-01 LAB — TROPONIN I (HIGH SENSITIVITY): Troponin I (High Sensitivity): 5 ng/L (ref <18)

## 2020-04-01 LAB — BRAIN NATRIURETIC PEPTIDE: B Natriuretic Peptide: 29.6 pg/mL (ref 0.0–100.0)

## 2020-04-01 MED ORDER — HYDROCORTISONE 20 MG PO TABS: 30.0000 mg | ORAL_TABLET | Freq: Every evening | ORAL | Status: DC

## 2020-04-01 MED ORDER — HYDROMORPHONE HCL 1 MG/ML IJ SOLN
0.5000 mg | Freq: Once | INTRAMUSCULAR | Status: AC
  Administered 2020-04-01: 0.5 mg via INTRAVENOUS
  Filled 2020-04-01: qty 1

## 2020-04-01 MED ORDER — HYDROMORPHONE HCL 1 MG/ML IJ SOLN
0.5000 mg | INTRAMUSCULAR | Status: DC | PRN
  Administered 2020-04-02 – 2020-04-03 (×8): 0.5 mg via INTRAVENOUS
  Filled 2020-04-01 (×8): qty 0.5

## 2020-04-01 MED ORDER — ZOLPIDEM TARTRATE 10 MG PO TABS
10.0000 mg | ORAL_TABLET | Freq: Every day | ORAL | Status: DC
  Administered 2020-04-02 – 2020-04-09 (×9): 10 mg via ORAL
  Filled 2020-04-01 (×9): qty 1

## 2020-04-01 MED ORDER — FUROSEMIDE 10 MG/ML IJ SOLN
40.0000 mg | Freq: Once | INTRAMUSCULAR | Status: AC
  Administered 2020-04-01: 40 mg via INTRAVENOUS
  Filled 2020-04-01: qty 4

## 2020-04-01 MED ORDER — DIPHENHYDRAMINE HCL 50 MG/ML IJ SOLN
50.0000 mg | Freq: Four times a day (QID) | INTRAMUSCULAR | Status: DC | PRN
  Administered 2020-04-01 – 2020-04-03 (×6): 50 mg via INTRAVENOUS
  Filled 2020-04-01 (×7): qty 1

## 2020-04-01 MED ORDER — LORAZEPAM 0.5 MG PO TABS: 0.5000 mg | ORAL_TABLET | Freq: Two times a day (BID) | ORAL | Status: DC | PRN

## 2020-04-01 MED ORDER — ZOLPIDEM TARTRATE 5 MG PO TABS: 5.0000 mg | ORAL_TABLET | Freq: Every evening | ORAL | Status: DC | PRN

## 2020-04-01 MED ORDER — METOPROLOL SUCCINATE ER 25 MG PO TB24
37.5000 mg | ORAL_TABLET | Freq: Every day | ORAL | Status: DC
  Administered 2020-04-02 – 2020-04-10 (×9): 37.5 mg via ORAL
  Filled 2020-04-01 (×9): qty 2

## 2020-04-01 MED ORDER — ASPIRIN 325 MG PO TABS
325.0000 mg | ORAL_TABLET | Freq: Every day | ORAL | Status: DC
  Administered 2020-04-01 – 2020-04-09 (×9): 325 mg via ORAL
  Filled 2020-04-01 (×9): qty 1

## 2020-04-01 MED ORDER — DRONABINOL 2.5 MG PO CAPS
2.5000 mg | ORAL_CAPSULE | Freq: Two times a day (BID) | ORAL | Status: DC | PRN
  Administered 2020-04-02 – 2020-04-10 (×2): 2.5 mg via ORAL
  Filled 2020-04-01 (×2): qty 1

## 2020-04-01 MED ORDER — DIPHENHYDRAMINE HCL 50 MG/ML IJ SOLN
INTRAMUSCULAR | Status: AC
  Filled 2020-04-01: qty 1

## 2020-04-01 MED ORDER — RIMEGEPANT SULFATE 75 MG PO TBDP: 75.0000 mg | ORAL_TABLET | Freq: Every day | ORAL | Status: DC | PRN

## 2020-04-01 MED ORDER — HYDROCORTISONE NA SUCCINATE PF 100 MG IJ SOLR
100.0000 mg | Freq: Once | INTRAMUSCULAR | Status: AC
  Administered 2020-04-01: 100 mg via INTRAVENOUS
  Filled 2020-04-01: qty 2

## 2020-04-01 MED ORDER — DIPHENHYDRAMINE HCL 50 MG/ML IJ SOLN
50.0000 mg | Freq: Once | INTRAMUSCULAR | Status: AC
  Administered 2020-04-01: 50 mg via INTRAVENOUS

## 2020-04-01 MED ORDER — FUROSEMIDE 10 MG/ML IJ SOLN
40.0000 mg | Freq: Two times a day (BID) | INTRAMUSCULAR | Status: DC
  Administered 2020-04-02: 40 mg via INTRAVENOUS
  Filled 2020-04-01: qty 4

## 2020-04-01 MED ORDER — CLONAZEPAM 1 MG PO TABS
1.0000 mg | ORAL_TABLET | Freq: Two times a day (BID) | ORAL | Status: DC
  Administered 2020-04-02 – 2020-04-10 (×11): 1 mg via ORAL
  Filled 2020-04-01 (×13): qty 1

## 2020-04-01 MED ORDER — DEXLANSOPRAZOLE 60 MG PO CPDR
60.0000 mg | DELAYED_RELEASE_CAPSULE | Freq: Every day | ORAL | Status: DC
  Administered 2020-04-02 – 2020-04-10 (×9): 60 mg via ORAL
  Filled 2020-04-01 (×9): qty 1

## 2020-04-01 MED ORDER — TOPIRAMATE 25 MG PO TABS
150.0000 mg | ORAL_TABLET | Freq: Every day | ORAL | Status: DC
  Administered 2020-04-01 – 2020-04-09 (×9): 150 mg via ORAL
  Filled 2020-04-01 (×9): qty 2

## 2020-04-01 MED ORDER — FLUTICASONE FUROATE-VILANTEROL 200-25 MCG/INH IN AEPB
1.0000 | INHALATION_SPRAY | Freq: Every day | RESPIRATORY_TRACT | Status: DC
  Administered 2020-04-02 – 2020-04-09 (×7): 1 via RESPIRATORY_TRACT
  Filled 2020-04-01: qty 28

## 2020-04-01 MED ORDER — ALBUTEROL SULFATE (2.5 MG/3ML) 0.083% IN NEBU: 2.5000 mg | INHALATION_SOLUTION | Freq: Four times a day (QID) | RESPIRATORY_TRACT | Status: DC | PRN

## 2020-04-01 MED ORDER — CLONAZEPAM 0.5 MG PO TABS
1.0000 mg | ORAL_TABLET | Freq: Once | ORAL | Status: AC
  Administered 2020-04-01: 1 mg via ORAL
  Filled 2020-04-01: qty 2

## 2020-04-01 MED ORDER — POTASSIUM CHLORIDE CRYS ER 20 MEQ PO TBCR
40.0000 meq | EXTENDED_RELEASE_TABLET | Freq: Once | ORAL | Status: AC
  Administered 2020-04-01: 40 meq via ORAL
  Filled 2020-04-01: qty 2

## 2020-04-01 MED ORDER — ROSUVASTATIN CALCIUM 10 MG PO TABS
10.0000 mg | ORAL_TABLET | Freq: Every day | ORAL | Status: DC
  Administered 2020-04-02 – 2020-04-10 (×9): 10 mg via ORAL
  Filled 2020-04-01 (×9): qty 1

## 2020-04-01 MED ORDER — HYDROMORPHONE HCL 2 MG PO TABS: 1.0000 mg | ORAL_TABLET | ORAL | Status: DC | PRN

## 2020-04-01 MED ORDER — ALBUTEROL SULFATE HFA 108 (90 BASE) MCG/ACT IN AERS: 2.0000 | INHALATION_SPRAY | Freq: Four times a day (QID) | RESPIRATORY_TRACT | Status: DC | PRN

## 2020-04-01 MED ORDER — HYDROCORTISONE 10 MG PO TABS
10.0000 mg | ORAL_TABLET | Freq: Every evening | ORAL | Status: DC
  Administered 2020-04-02 – 2020-04-05 (×4): 10 mg via ORAL
  Filled 2020-04-01 (×4): qty 1

## 2020-04-01 MED ORDER — LEVOTHYROXINE SODIUM 112 MCG PO TABS
112.0000 ug | ORAL_TABLET | Freq: Every day | ORAL | Status: DC
  Administered 2020-04-02 – 2020-04-10 (×9): 112 ug via ORAL

## 2020-04-01 MED ORDER — BUDESONIDE 0.25 MG/2ML IN SUSP
0.2500 mg | Freq: Two times a day (BID) | RESPIRATORY_TRACT | Status: DC
  Administered 2020-04-02 – 2020-04-03 (×4): 0.25 mg via RESPIRATORY_TRACT
  Filled 2020-04-01 (×5): qty 2

## 2020-04-01 MED ORDER — ENOXAPARIN SODIUM 40 MG/0.4ML ~~LOC~~ SOLN
40.0000 mg | Freq: Every day | SUBCUTANEOUS | Status: DC
  Filled 2020-04-01 (×2): qty 0.4

## 2020-04-01 MED ORDER — SODIUM CHLORIDE 0.9 % IV SOLN: INTRAVENOUS | Status: AC | PRN

## 2020-04-01 MED ORDER — ESCITALOPRAM OXALATE 20 MG PO TABS
20.0000 mg | ORAL_TABLET | Freq: Every evening | ORAL | Status: DC
  Administered 2020-04-02 – 2020-04-10 (×9): 20 mg via ORAL
  Filled 2020-04-01 (×9): qty 1

## 2020-04-01 MED ORDER — SUCRALFATE 1 G PO TABS: 1.0000 g | ORAL_TABLET | Freq: Every day | ORAL | Status: DC | PRN

## 2020-04-01 MED ORDER — DIPHENHYDRAMINE HCL 50 MG/ML IJ SOLN
50.0000 mg | Freq: Once | INTRAMUSCULAR | Status: AC
  Administered 2020-04-01: 50 mg via INTRAVENOUS
  Filled 2020-04-01: qty 1

## 2020-04-01 MED ORDER — COLCHICINE 0.6 MG PO TABS
0.6000 mg | ORAL_TABLET | Freq: Every day | ORAL | Status: DC
  Administered 2020-04-02 – 2020-04-05 (×3): 0.6 mg via ORAL
  Filled 2020-04-01 (×6): qty 1

## 2020-04-01 MED ORDER — HYDROCORTISONE 20 MG PO TABS
20.0000 mg | ORAL_TABLET | Freq: Every day | ORAL | Status: DC
  Administered 2020-04-02 – 2020-04-05 (×4): 20 mg via ORAL
  Filled 2020-04-01 (×5): qty 1

## 2020-04-01 MED ORDER — BECLOMETHASONE DIPROPIONATE 80 MCG/ACT IN AERS: 1.0000 | INHALATION_SPRAY | Freq: Two times a day (BID) | RESPIRATORY_TRACT | Status: DC

## 2020-04-01 MED ORDER — SODIUM CHLORIDE 0.9 % IV SOLN
Freq: Once | INTRAVENOUS | Status: AC
  Filled 2020-04-01: qty 250

## 2020-04-01 NOTE — ED Notes (Signed)
Patient transported to X-ray 

## 2020-04-01 NOTE — ED Provider Notes (Addendum)
[facility] DEPT Provider Note   CSN: [id number] Arrival date & time: 04/01/20  1442     History Chief Complaint  Patient presents with  . Arm Swelling    [name] is a 49 y.o. female.  Presents to ER with myriad complaints.  Arm swelling, weight gain, generalized fatigue, fever yesterday.  Patient reports over the last few days she has had some increase in her chronic nausea and chronic headaches.  Discharged is normally treated with Benadryl.  No abdominal pain, no diarrhea or constipation associated with this.  She has chronic lymphedema in her arms, reports she has had significant worsening in her left upper extremity over the past couple days.  Has had 7 pound weight gain in the last 24 hours.  Has history of upper extremity SVT, not currently on anticoagulation.  Also has history of Addison's disease.  Yesterday had fever 102.3 at home.  Took some Motrin and did not have any recurrence of her fever.  She has a couple scabs on her left hand, states that she had some mild redness about a week ago that her dermatologist gave her a prescription for Bactrim, redness had improved and she completed her course of couple days ago.  Past medical history Addison's disease on chronic steroids, cancer not currently on any chemotherapy, vasculitis, thyroid cancer post removal. HPI     Past Medical History:  Diagnosis Date  . Addison's disease ([place])   . Adrenal insufficiency ([place])   . Anemia   . Anxiety   . Aortic stenosis   . Aortic stenosis   . Appendicitis 12/19/09  . Appendicitis   . Breast cancer ([place])    STATUS POST BILATERAL MASTECTOMY. STATUS POST RECONSTRUCTION. SHE HAD SILICONE BREAST IMPLANTS AND THE LEFT IMPLANT IS LEAKING SLIGHTLY  . Cellulitis of right middle finger 11/07/2018  . Cervical cancer ([place]) 12/23/2018  . Chest pain   . CHF with right heart failure ([place]) 04/17/2017  . Chronic respiratory failure with hypoxia ([place]) 12/23/2018  . Cough  variant asthma 04/13/2019  . Depression   . GERD (gastroesophageal reflux disease)   . Headache    migraines  . Heart murmur   . History of kidney stones   . Hodgkin lymphoma ([facility])    STATUS POST MANTLE RADIATION  . Hodgkin's lymphoma ([place])    1987  . Hypertension   . Hypoxia   . Necrotizing fasciitis ([place]) 12/23/2018  . Non-ischemic cardiomyopathy ([place])   . Osteoporosis   . Palpitations   . Pituitary adenoma ([place]) 12/23/2018  . Pneumonia   . PONV (postoperative nausea and vomiting)   . Pre-diabetes    per pt; no meds  . Pulmonary hypertension ([place]) 12/23/2018  . Raynaud phenomenon   . Right heart failure ([place]) 04/17/2017  . Supplemental oxygen dependent    3 liters  . SVT (supraventricular tachycardia) ([place])   . Tachycardia   . Thyroid cancer ([place])    STATUS POST SURGICAL REMOVAL-CURRENT ON THYROID REPLACEMENT    Patient Active Problem List   Diagnosis Date Noted  . Generalized weakness 02/10/2020  . Headache 02/10/2020  . Subcutaneous nodule 02/01/2020  . Port-A-Cath in place 09/09/2019  . Moderate persistent asthma without complication 76/19/5093  . Acute superficial venous thrombosis of lower extremity, right 07/16/2019  . Injury of left index finger 12/24/2018  . Pituitary adenoma ([place]) 12/23/2018  . History of cervical cancer 12/23/2018  . Chronic respiratory failure with hypoxia ([place]) 12/23/2018  . SOB (shortness  of breath)   . Nephrolithiasis 11/07/2018  . Oxygen dependent 11/07/2018  . Migraine 11/07/2018  . PVC's (premature ventricular contractions) 10/27/2018  . Hyperlipidemia 03/09/2014  . Hx of Hodgkins lymphoma   . History of ductal carcinoma in situ (DCIS) of breast   . Thyroid cancer ([place])   . Adrenal insufficiency ([place])   . Hypertension   . Raynaud phenomenon   . GERD (gastroesophageal reflux disease)     Past Surgical History:  Procedure Laterality Date  . ABDOMINAL HYSTERECTOMY    . AMPUTATION Left 01/30/2019   Procedure: Left Index finger  amputation with flap reconstruction and repair reconstruction;  Surgeon: Roseanne Kaufman, MD;  Location: [facility];  Service: Orthopedics;  Laterality: Left;  . APPENDECTOMY    . CARDIAC CATHETERIZATION  05/18/09   NORMAL CATH  . I & D EXTREMITY Left 12/23/2018   Procedure: IRRIGATION AND DEBRIDEMENT HAND / INDEX FINGER;  Surgeon: Roseanne Kaufman, MD;  Location: [facility];  Service: Orthopedics;  Laterality: Left;  Marland Kitchen MASTECTOMY    . PITUITARY SURGERY    . RIGHT/LEFT HEART CATH AND CORONARY ANGIOGRAPHY N/A 04/02/2018   Procedure: RIGHT/LEFT HEART CATH AND CORONARY ANGIOGRAPHY;  Surgeon: Burnell Blanks, MD;  Location: [facility];  Service: Cardiovascular;  Laterality: N/A;  . TOTAL THYROIDECTOMY    . VIDEO BRONCHOSCOPY Bilateral 11/14/2018   Procedure: VIDEO BRONCHOSCOPY WITHOUT FLUORO;  Surgeon: Margaretha Seeds, MD;  Location: [facility];  Service: Cardiopulmonary;  Laterality: Bilateral;  . VIDEO BRONCHOSCOPY WITH ENDOBRONCHIAL ULTRASOUND N/A 11/19/2018   Procedure: VIDEO BRONCHOSCOPY WITH ENDOBRONCHIAL ULTRASOUND;  Surgeon: Margaretha Seeds, MD;  Location: [facility];  Service: Thoracic;  Laterality: N/A;     OB History   No obstetric history on file.     Family History  Family history unknown: Yes    Social History   Tobacco Use  . Smoking status: Never Smoker  . Smokeless tobacco: Never Used  Vaping Use  . Vaping Use: Never used  Substance Use Topics  . Alcohol use: Yes    Comment: social   . Drug use: No    Home Medications Prior to Admission medications   Medication Sig Start Date End Date Taking? Authorizing Provider  albuterol (2.5 MG/3ML) 0.083% NEBU 3 mL, albuterol (5 MG/ML) 0.5% NEBU 0.5 mL Inhale 3 mg into the lungs every 6 (six) hours as needed (SOB).    Yes [provider]  albuterol (VENTOLIN HFA) 108 (90 Base) MCG/ACT inhaler Inhale 2 puffs into the lungs every 6 (six) hours as needed for wheezing or shortness of breath.   Yes [provider]  Alum & Mag Hydroxide-Simeth (GI COCKTAIL) SUSP suspension Take 30 mLs daily as needed by mouth for indigestion (BLEEDING ULCERS). Shake well.   Yes [provider]  aspirin 325 MG tablet Take 325 mg by mouth at bedtime.    Yes [provider]  beclomethasone (QVAR) 80 MCG/ACT inhaler Inhale 1 puff into the lungs 2 (two) times daily.    Yes [provider]  benzonatate (TESSALON) 200 MG capsule Take 1 capsule (200 mg total) by mouth 3 (three) times daily as needed for cough. 04/15/19  Yes Martyn Ehrich, NP  clonazePAM (KLONOPIN) 1 MG tablet Take 1 mg by mouth 2 (two) times daily.    Yes [provider]  colchicine 0.6 MG tablet Take 0.6 mg by mouth daily in the afternoon.  12/31/19  Yes [provider]  dexlansoprazole (DEXILANT) 60 MG capsule  Take 60 mg by mouth daily.     Yes [provider]  diphenhydrAMINE (BENADRYL) 50 MG/ML injection Inject 1 mL (50 mg total) into the vein every 6 (six) hours as needed (nausea). 12/25/18  Yes Rai, Ripudeep K, MD  dronabinol (MARINOL) 2.5 MG capsule Take 2.5 mg by mouth 2 (two) times daily as needed (nausea).   Yes [provider]  escitalopram (LEXAPRO) 20 MG tablet Take 20 mg by mouth every evening.  04/24/16  Yes [provider]  estradiol (ESTRACE) 2 MG tablet Take 2 mg by mouth See admin instructions. Takes 1 tablet by mouth every 3 days. 02/01/16  Yes [provider]  fluticasone furoate-vilanterol (BREO ELLIPTA) 200-25 MCG/INH AEPB Inhale 1 puff into the lungs daily.   Yes [provider]  furosemide (LASIX) 40 MG tablet Take 1 tablet (40 mg total) by mouth daily. Patient taking differently: Take 40 mg by mouth in the morning and at bedtime.  02/17/20  Yes Kyle, Tyrone A, DO  Galcanezumab-gnlm 120 MG/ML SOAJ Inject 120 mg into the skin every 28 (twenty-eight) days.  03/31/19  Yes [provider]  Heparin Lock Flush (HEPARIN NICU/SCN FLUSH) 1  UNIT/ML SOLN Heparin Lock Flush Solution - 500 USP UNITS/5 mL  #30 pack with 1 refill Patient taking differently: Inject 0.5-1.7 mLs into the vein See admin instructions. Before accessing port 02/01/20  Yes Nicholas Lose, MD  levothyroxine (SYNTHROID, LEVOTHROID) 112 MCG tablet Take 112 mcg by mouth daily before breakfast.   Yes [provider]  LORazepam (ATIVAN) 0.5 MG tablet Take 1-2 tablets (0.5-1 mg total) by mouth 2 (two) times daily as needed (nausea). Patient taking differently: Take 0.5 mg by mouth 2 (two) times daily as needed for anxiety or sleep (nausea).  02/22/20  Yes Nicholas Lose, MD  Mepolizumab (NUCALA) 100 MG SOLR Inject 100 mg into the skin every 28 (twenty-eight) days. 05/13/19  Yes Margaretha Seeds, MD  metoprolol succinate (TOPROL-XL) 25 MG 24 hr tablet Take 37.5 mg by mouth daily.    Yes [provider]  oxyCODONE-acetaminophen (PERCOCET/ROXICET) 5-325 MG tablet Take 1 tablet by mouth every 8 (eight) hours as needed for severe pain. 02/01/20  Yes Nicholas Lose, MD  OXYGEN Inhale 4 L into the lungs continuous.    Yes [provider]  Rimegepant Sulfate (NURTEC) 75 MG TBDP Take 75 mg by mouth daily as needed (Migraine).    Yes [provider]  rosuvastatin (CRESTOR) 10 MG tablet Take 10 mg by mouth daily.  02/28/18  Yes [provider]  silver sulfADIAZINE (SILVADENE) 1 % cream Apply 1 application topically daily as needed (Open Wound).  11/16/19  Yes [provider]  sodium chloride flush 0.9 % SOLN injection 10 mL syringes - #30 count with 1 refill 02/01/20  Yes Nicholas Lose, MD  sucralfate (CARAFATE) 1 g tablet Take 1 g by mouth daily as needed (FOR ULCERS).    Yes [provider]  topiramate (TOPAMAX) 50 MG tablet Take 150 mg by mouth at bedtime.  12/25/19  Yes [provider]  zolpidem (AMBIEN CR) 12.5 MG CR tablet Take 12.5 mg by mouth at bedtime.     Yes [provider]  budesonide (PULMICORT) 0.25  MG/2ML nebulizer solution Take 2 mLs (0.25 mg total) by nebulization 2 (two) times daily for 5 days. Patient not taking: Reported on 04/01/2020 02/14/20 02/19/20  Cherylann Ratel A, DO  EPINEPHRINE 0.3 mg/0.3 mL IJ SOAJ injection INJECT 0.3 MLS (0.3 MG  TOTAL) INTO THE MUSCLE AS NEEDED FOR ANAPHYLAXIS. 12/09/19   Margaretha Seeds, MD  furosemide (LASIX) 40 MG tablet Take 1 tablet (40 mg total) by mouth 2 (two) times daily for 5 doses. Patient not taking: Reported on 04/01/2020 02/14/20 02/17/20  Cherylann Ratel A, DO  hydrocortisone (CORTEF) 10 MG tablet Take 1-2 tablets (10-20 mg total) by mouth See admin instructions. Take 20 mg in the am and 11m in the evening Patient taking differently: Take 10-20 mg by mouth See admin instructions. Take 20 mg by mouth in the morning and 145min the evening 02/17/20   KyCherylann Ratel, DO  sulfamethoxazole-trimethoprim (BACTRIM DS) 800-160 MG tablet Take 1 tablet by mouth 2 (two) times daily. 03/08/20   [provider]    Allergies    Mushroom extract complex, Na ferric gluc cplx in sucrose, Cymbalta [duloxetine hcl], Succinylcholine, Buprenorphine hcl, Compazine, Metoclopramide, Morphine and related, Ondansetron, Promethazine hcl, and Tegaderm ag mesh [silver]  Review of Systems   Review of Systems  Constitutional: Negative for chills and fever.  HENT: Negative for ear pain and sore throat.   Eyes: Negative for pain and visual disturbance.  Respiratory: Negative for cough and shortness of breath.   Cardiovascular: Negative for chest pain and palpitations.  Gastrointestinal: Positive for nausea and vomiting. Negative for abdominal pain.  Genitourinary: Negative for dysuria and hematuria.  Musculoskeletal: Positive for arthralgias. Negative for back pain.  Skin: Negative for color change and rash.  Neurological: Negative for seizures and syncope.  All other systems reviewed and are negative.   Physical Exam Updated Vital Signs BP 103/74   Pulse 88   Temp  98.4 F (36.9 C) (Oral)   Resp 12   Ht _0  (1.651 m)   Wt 89.8 kg   LMP  (LMP Unknown)   SpO2 98%   BMI 32.95 kg/m   Physical Exam Vitals and nursing note reviewed.  Constitutional:      General: She is not in acute distress.    Appearance: She is well-developed.  HENT:     Head: Normocephalic and atraumatic.  Eyes:     Conjunctiva/sclera: Conjunctivae normal.  Cardiovascular:     Rate and Rhythm: Normal rate and regular rhythm.     Pulses: Normal pulses.     Heart sounds: No murmur heard.   Pulmonary:     Effort: Pulmonary effort is normal. No respiratory distress.     Breath sounds: Normal breath sounds.     Comments: S/p port placement Abdominal:     Palpations: Abdomen is soft.     Tenderness: There is no abdominal tenderness.  Musculoskeletal:     Cervical back: Neck supple.     Comments: LUE: generalized edema in extremity, worse over hand, wrist, forearm, very mild erythema over dorsum of hand, not warm to touch, normal radial pulse  Skin:    General: Skin is warm and dry.     Capillary Refill: Capillary refill takes less than 2 seconds.  Neurological:     General: No focal deficit present.     Mental Status: She is alert.  Psychiatric:        Mood and Affect: Mood normal.        Behavior: Behavior normal.     ED Results / Procedures / Treatments   Labs (all labs ordered are listed, but only abnormal results are displayed) Labs Reviewed  CBC WITH DIFFERENTIAL/PLATELET - Abnormal; Notable for the following components:      Result  Value   RBC 3.63 (*)    Hemoglobin 10.8 (*)    HCT 33.9 (*)    All other components within normal limits  COMPREHENSIVE METABOLIC PANEL - Abnormal; Notable for the following components:   Potassium 3.3 (*)    Glucose, Bld 109 (*)    Creatinine, Ser 1.06 (*)    Calcium 8.3 (*)    All other components within normal limits  CULTURE, BLOOD (SINGLE)  SARS CORONAVIRUS 2 BY RT PCR (HOSPITAL ORDER, [facility])  BRAIN NATRIURETIC PEPTIDE  MAGNESIUM  URINALYSIS, ROUTINE W REFLEX MICROSCOPIC  TROPONIN I (HIGH SENSITIVITY)  TROPONIN I (HIGH SENSITIVITY)    EKG None  Radiology DG Chest 2 View  Result Date: 04/01/2020 CLINICAL DATA:  Shortness of breath EXAM: CHEST - 2 VIEW COMPARISON:  02/10/2020 FINDINGS: Right Port-A-Cath remains in place, unchanged. Heart is normal size. No confluent airspace opacities or effusions. No acute bony abnormality. IMPRESSION: No active cardiopulmonary disease. Electronically Signed   By: Rolm Baptise M.D.   On: 04/01/2020 16:23   UE VENOUS DUPLEX ([facility] & [facility] 7 am - 7 pm)  Result Date: 04/01/2020 UPPER VENOUS STUDY  Indications: Swelling Risk Factors: Cancer. Limitations: Body habitus and poor ultrasound/tissue interface. Comparison Study: No prior studies. Performing Technologist: Oliver Hum RVT  Examination Guidelines: A complete evaluation includes B-mode imaging, spectral Doppler, color Doppler, and power Doppler as needed of all accessible portions of each vessel. Bilateral testing is considered an integral part of a complete examination. Limited examinations for reoccurring indications may be performed as noted.  Right Findings: +----------+------------+---------+-----------+----------+-------+ RIGHT     CompressiblePhasicitySpontaneousPropertiesSummary +----------+------------+---------+-----------+----------+-------+ Subclavian    Full       Yes       Yes                      +----------+------------+---------+-----------+----------+-------+  Left Findings: +----------+------------+---------+-----------+----------+-------+ LEFT      CompressiblePhasicitySpontaneousPropertiesSummary +----------+------------+---------+-----------+----------+-------+ IJV           Full       Yes       No                       +----------+------------+---------+-----------+----------+-------+ Subclavian    Full       Yes       No                        +----------+------------+---------+-----------+----------+-------+ Axillary      Full       Yes       No                       +----------+------------+---------+-----------+----------+-------+ Brachial      Full       Yes       No                       +----------+------------+---------+-----------+----------+-------+ Radial        Full                                          +----------+------------+---------+-----------+----------+-------+ Ulnar         Full                                          +----------+------------+---------+-----------+----------+-------+  Cephalic      Full                                          +----------+------------+---------+-----------+----------+-------+ Basilic       Full                                          +----------+------------+---------+-----------+----------+-------+  Summary:  Right: No evidence of thrombosis in the subclavian.  Left: No evidence of deep vein thrombosis in the upper extremity. No evidence of superficial vein thrombosis in the upper extremity.  *See table(s) above for measurements and observations.    Preliminary     Procedures Procedures (including critical care time)  Medications Ordered in ED Medications  hydrocortisone sodium succinate (SOLU-CORTEF) 100 MG injection 100 mg (has no administration in time range)  furosemide (LASIX) injection 40 mg (has no administration in time range)  potassium chloride SA (KLOR-CON) CR tablet 40 mEq (has no administration in time range)  HYDROmorphone (DILAUDID) injection 0.5 mg (0.5 mg Intravenous Given 04/01/20 1545)  diphenhydrAMINE (BENADRYL) injection 50 mg (50 mg Intravenous Given 04/01/20 1544)  clonazePAM (KLONOPIN) tablet 1 mg (1 mg Oral Given 04/01/20 1706)  HYDROmorphone (DILAUDID) injection 0.5 mg (0.5 mg Intravenous Given 04/01/20 1706)    ED Course  I have reviewed the triage vital signs and the nursing notes.  Pertinent labs & imaging  results that were available during my care of the patient were reviewed by me and considered in my medical decision making (see chart for details).    MDM Rules/Calculators/A&P                          49 year old lady presents to ER with concern for left upper extremity swelling, nausea vomiting, generalized weakness.  On exam patient is noted to be overall well-appearing with stable vital signs.  She noted fever yesterday but no fevers today.  Her left upper extremity noted to have significant generalized edema, very minimal erythema, not warm to touch, no leukocytosis, lower suspicion for cellulitis or soft tissue infection.  Ultrasound negative for DVT or SVT.  Patient on chronic Lasix, had similar episode that responded well to IV diuretics.  Given severity of edema, believe patient would benefit from a trial of IV diuretic therapy.  Mildly hypokalemic, provided potassium replacement and give dose of IV diuretic.  Regarding patient's acute on chronic nausea, she has no associated abdominal pain and has a soft abdomen.  Suspect patient may have Addison's crisis.  Provided stress dose steroids.  I reviewed case in detail with her primary oncologist, Dr. Lindi Adie, he did not have any additional recommendations, agreed with current management thus far.  Believe patient would benefit from inpatient admission for further management and observation.  Will consult [facility] hospitalist for admission.  Final Clinical Impression(s) / ED Diagnoses Final diagnoses:  Addison's disease ([place])  Hypervolemia, unspecified hypervolemia type  Arm edema  Intractable nausea and vomiting    Rx / DC Orders ED Discharge Orders    None       Lucrezia Starch, MD 04/01/20 1815    Lucrezia Starch, MD 04/01/20 1816

## 2020-04-01 NOTE — Telephone Encounter (Signed)
Hey.  It looks like patient had several no shows to [facility].  Can you f/u with her on the hand issue, and see if she needs anything?

## 2020-04-01 NOTE — Patient Instructions (Signed)
Implanted [facility] Guide An implanted port is a device that is placed under the skin. It is usually placed in the chest. The device can be used to give IV medicine, to take blood, or for dialysis. You may have an implanted port if:  You need IV medicine that would be irritating to the small veins in your hands or arms.  You need IV medicines, such as antibiotics, for a long period of time.  You need IV nutrition for a long period of time.  You need dialysis. Having a port means that your health care provider will not need to use the veins in your arms for these procedures. You may have fewer limitations when using a port than you would if you used other types of long-term IVs, and you will likely be able to return to normal activities after your incision heals. An implanted port has two main parts:  Reservoir. The reservoir is the part where a needle is inserted to give medicines or draw blood. The reservoir is round. After it is placed, it appears as a small, raised area under your skin.  Catheter. The catheter is a thin, flexible tube that connects the reservoir to a vein. Medicine that is inserted into the reservoir goes into the catheter and then into the vein. How is my port accessed? To access your port:  A numbing cream may be placed on the skin over the port site.  Your health care provider will put on a mask and sterile gloves.  The skin over your port will be cleaned carefully with a germ-killing soap and allowed to dry.  Your health care provider will gently pinch the port and insert a needle into it.  Your health care provider will check for a blood return to make sure the port is in the vein and is not clogged.  If your port needs to remain accessed to get medicine continuously (constant infusion), your health care provider will place a clear bandage (dressing) over the needle site. The dressing and needle will need to be changed every week, or as told by your health care  provider. What is flushing? Flushing helps keep the port from getting clogged. Follow instructions from your health care provider about how and when to flush the port. Ports are usually flushed with saline solution or a medicine called heparin. The need for flushing will depend on how the port is used:  If the port is only used from time to time to give medicines or draw blood, the port may need to be flushed: ? Before and after medicines have been given. ? Before and after blood has been drawn. ? As part of routine maintenance. Flushing may be recommended every 4-6 weeks.  If a constant infusion is running, the port may not need to be flushed.  Throw away any syringes in a disposal container that is meant for sharp items (sharps container). You can buy a sharps container from a pharmacy, or you can make one by using an empty hard plastic bottle with a cover. How long will my port stay implanted? The port can stay in for as long as your health care provider thinks it is needed. When it is time for the port to come out, a surgery will be done to remove it. The surgery will be similar to the procedure that was done to put the port in. Follow these instructions at home:   Flush your port as told by your health care provider.  If you need an infusion over several days, follow instructions from your health care provider about how to take care of your port site. Make sure you: ? Wash your hands with soap and water before you change your dressing. If soap and water are not available, use alcohol-based hand sanitizer. ? Change your dressing as told by your health care provider. ? Place any used dressings or infusion bags into a plastic bag. Throw that bag in the trash. ? Keep the dressing that covers the needle clean and dry. Do not get it wet. ? Do not use scissors or sharp objects near the tube. ? Keep the tube clamped, unless it is being used.  Check your port site every day for signs of  infection. Check for: ? Redness, swelling, or pain. ? Fluid or blood. ? Pus or a bad smell.  Protect the skin around the port site. ? Avoid wearing bra straps that rub or irritate the site. ? Protect the skin around your port from seat belts. Place a soft pad over your chest if needed.  Bathe or shower as told by your health care provider. The site may get wet as long as you are not actively receiving an infusion.  Return to your normal activities as told by your health care provider. Ask your health care provider what activities are safe for you.  Carry a medical alert card or wear a medical alert bracelet at all times. This will let health care providers know that you have an implanted port in case of an emergency. Get help right away if:  You have redness, swelling, or pain at the port site.  You have fluid or blood coming from your port site.  You have pus or a bad smell coming from the port site.  You have a fever. Summary  Implanted ports are usually placed in the chest for long-term IV access.  Follow instructions from your health care provider about flushing the port and changing bandages (dressings).  Take care of the area around your port by avoiding clothing that puts pressure on the area, and by watching for signs of infection.  Protect the skin around your port from seat belts. Place a soft pad over your chest if needed.  Get help right away if you have a fever or you have redness, swelling, pain, drainage, or a bad smell at the port site. This information is not intended to replace advice given to you by your health care provider. Make sure you discuss any questions you have with your health care provider. Document Revised: 01/09/2019 Document Reviewed: 10/20/2016 Elsevier Patient Education  2020 [employer].

## 2020-04-01 NOTE — ED Notes (Signed)
[place] at bedside

## 2020-04-01 NOTE — ED Notes (Signed)
Transport called to take patient to assigned room.

## 2020-04-01 NOTE — H&P (Signed)
History and Physical    [name] DOB: 03/16/1971 DOA: 04/01/2020  PCP: Su Grand, MD  Patient coming from: home, referred by hematology   Chief Complaint: nausea, fatigue  HPI: [name] is a 49 y.o. female with medical history significant for significant medical complexity. Multiple cancer history (breast cancer, hodgkins lymphoma, thyroid cancer s/p resection, cervical cancer - all in remission), chest lymphoma, post-surgical hypothyroid, pituitary tumor s/p resection, adrenal insufficiency on steroids, nephrolithiasis, venous thrombosis, migraines, asthma, right heart failure, chronic O2 (3-4 L [place]), htn, and recurrent ulcers and swelling of left arm thought to be possible vasculitis, who presents with the above.  Similar presentation and admission last month. Headache was treated, was given stress dose steroids, had nausea treated with IV benadryl and narinol, had worsening lymphedema of left upper extremity that was treated with IV lasix.  She reports after discharge several weeks of feeling good. Now few weeks of returning symptoms. Fatigue, nausea without vomiting or diarrhea. Yesterday measured temperature at home of 102, no fever since. No cough or new sob. No dysuria or hematuria. Does note an ulcer on left 3rd digit and increased swelling and redness of left arm. Says has needed antibiotics in the past for recurrent infections in the left arm and recently finished a course of bactrim.  Is followed by dermatology and rheumatology at [facility], [facility], [facility], [facility], and neurology and gastroenterology at [facility].  ED Course: clonazepam, lasix, cortef 100, dilaudid, k 40, labs, cxr, negative Left upper extremity doppler  Review of Systems: As per HPI otherwise 10 point review of systems negative.    Past Medical History:  Diagnosis Date  . Addison's disease ([place])   . Adrenal insufficiency ([place])   . Anemia   .  Anxiety   . Aortic stenosis   . Aortic stenosis   . Appendicitis 12/19/09  . Appendicitis   . Breast cancer ([place])    STATUS POST BILATERAL MASTECTOMY. STATUS POST RECONSTRUCTION. SHE HAD SILICONE BREAST IMPLANTS AND THE LEFT IMPLANT IS LEAKING SLIGHTLY  . Cellulitis of right middle finger 11/07/2018  . Cervical cancer ([place]) 12/23/2018  . Chest pain   . CHF with right heart failure ([place]) 04/17/2017  . Chronic respiratory failure with hypoxia ([place]) 12/23/2018  . Cough variant asthma 04/13/2019  . Depression   . GERD (gastroesophageal reflux disease)   . Headache    migraines  . Heart murmur   . History of kidney stones   . Hodgkin lymphoma (HCC)    STATUS POST MANTLE RADIATION  . Hodgkin's lymphoma ([place])    1987  . Hypertension   . Hypoxia   . Necrotizing fasciitis ([place]) 12/23/2018  . Non-ischemic cardiomyopathy ([place])   . Osteoporosis   . Palpitations   . Pituitary adenoma ([place]) 12/23/2018  . Pneumonia   . PONV (postoperative nausea and vomiting)   . Pre-diabetes    per pt; no meds  . Pulmonary hypertension ([place]) 12/23/2018  . Raynaud phenomenon   . Right heart failure ([place]) 04/17/2017  . Supplemental oxygen dependent    3 liters  . SVT (supraventricular tachycardia) ([place])   . Tachycardia   . Thyroid cancer ([place])    STATUS POST SURGICAL REMOVAL-CURRENT ON THYROID REPLACEMENT    Past Surgical History:  Procedure Laterality Date  . ABDOMINAL HYSTERECTOMY    . AMPUTATION Left 01/30/2019   Procedure: Left Index finger amputation with flap reconstruction and repair reconstruction;  Surgeon: Roseanne Kaufman, MD;  Location: [facility]  OR;  Service: Orthopedics;  Laterality: Left;  . APPENDECTOMY    . CARDIAC CATHETERIZATION  05/18/09   NORMAL CATH  . I & D EXTREMITY Left 12/23/2018   Procedure: IRRIGATION AND DEBRIDEMENT HAND / INDEX FINGER;  Surgeon: Roseanne Kaufman, MD;  Location: [place];  Service: Orthopedics;  Laterality: Left;  Marland Kitchen MASTECTOMY    . PITUITARY SURGERY    . RIGHT/LEFT HEART  CATH AND CORONARY ANGIOGRAPHY N/A 04/02/2018   Procedure: RIGHT/LEFT HEART CATH AND CORONARY ANGIOGRAPHY;  Surgeon: Burnell Blanks, MD;  Location: [place] CV LAB;  Service: Cardiovascular;  Laterality: N/A;  . TOTAL THYROIDECTOMY    . VIDEO BRONCHOSCOPY Bilateral 11/14/2018   Procedure: VIDEO BRONCHOSCOPY WITHOUT FLUORO;  Surgeon: Margaretha Seeds, MD;  Location: [place];  Service: Cardiopulmonary;  Laterality: Bilateral;  . VIDEO BRONCHOSCOPY WITH ENDOBRONCHIAL ULTRASOUND N/A 11/19/2018   Procedure: VIDEO BRONCHOSCOPY WITH ENDOBRONCHIAL ULTRASOUND;  Surgeon: Margaretha Seeds, MD;  Location: [place];  Service: Thoracic;  Laterality: N/A;     reports that she has never smoked. She has never used smokeless tobacco. She reports current alcohol use. She reports that she does not use drugs.  Allergies  Allergen Reactions  . Mushroom Extract Complex Anaphylaxis  . Na Ferric Gluc Cplx In Sucrose Anaphylaxis  . Cymbalta [Duloxetine Hcl] Swelling and Anxiety  . Succinylcholine Other (See Comments)    Lock Jaw  . Buprenorphine Hcl Hives  . Compazine Other (See Comments)    Altered mental status Aggression  . Metoclopramide Other (See Comments)    Dystonia  . Morphine And Related Hives  . Ondansetron Hives and Rash  . Promethazine Hcl Hives  . Tegaderm Ag Mesh [Silver] Rash    Old formulation only, is able to tolerate new formulation    Family History  Family history unknown: Yes    Prior to Admission medications   Medication Sig Start Date End Date Taking? Authorizing Provider  albuterol (2.5 MG/3ML) 0.083% NEBU 3 mL, albuterol (5 MG/ML) 0.5% NEBU 0.5 mL Inhale 3 mg into the lungs every 6 (six) hours as needed (SOB).    Yes [provider]  albuterol (VENTOLIN HFA) 108 (90 Base) MCG/ACT inhaler Inhale 2 puffs into the lungs every 6 (six) hours as needed for wheezing or shortness of breath.   Yes [provider]  Alum & Mag Hydroxide-Simeth (GI COCKTAIL)  SUSP suspension Take 30 mLs daily as needed by mouth for indigestion (BLEEDING ULCERS). Shake well.   Yes [provider]  aspirin 325 MG tablet Take 325 mg by mouth at bedtime.    Yes [provider]  beclomethasone (QVAR) 80 MCG/ACT inhaler Inhale 1 puff into the lungs 2 (two) times daily.    Yes [provider]  benzonatate (TESSALON) 200 MG capsule Take 1 capsule (200 mg total) by mouth 3 (three) times daily as needed for cough. 04/15/19  Yes Martyn Ehrich, NP  clonazePAM (KLONOPIN) 1 MG tablet Take 1 mg by mouth 2 (two) times daily.    Yes [provider]  colchicine 0.6 MG tablet Take 0.6 mg by mouth daily in the afternoon.  12/31/19  Yes [provider]  dexlansoprazole (DEXILANT) 60 MG capsule Take 60 mg by mouth daily.     Yes [provider]  diphenhydrAMINE (BENADRYL) 50 MG/ML injection Inject 1 mL (50 mg total) into the vein every 6 (six) hours as needed (nausea). 12/25/18  Yes Rai, Ripudeep K, MD  dronabinol (MARINOL) 2.5 MG capsule Take  2.5 mg by mouth 2 (two) times daily as needed (nausea).   Yes [provider]  escitalopram (LEXAPRO) 20 MG tablet Take 20 mg by mouth every evening.  04/24/16  Yes [provider]  estradiol (ESTRACE) 2 MG tablet Take 2 mg by mouth See admin instructions. Takes 1 tablet by mouth every 3 days. 02/01/16  Yes [provider]  fluticasone furoate-vilanterol (BREO ELLIPTA) 200-25 MCG/INH AEPB Inhale 1 puff into the lungs daily.   Yes [provider]  furosemide (LASIX) 40 MG tablet Take 1 tablet (40 mg total) by mouth daily. Patient taking differently: Take 40 mg by mouth in the morning and at bedtime.  02/17/20  Yes Kyle, Tyrone A, DO  Galcanezumab-gnlm 120 MG/ML SOAJ Inject 120 mg into the skin every 28 (twenty-eight) days.  03/31/19  Yes [provider]  Heparin Lock Flush (HEPARIN NICU/SCN FLUSH) 1 UNIT/ML SOLN Heparin Lock Flush Solution - 500 USP UNITS/5  mL  #30 pack with 1 refill Patient taking differently: Inject 0.5-1.7 mLs into the vein See admin instructions. Before accessing port 02/01/20  Yes Nicholas Lose, MD  levothyroxine (SYNTHROID, LEVOTHROID) 112 MCG tablet Take 112 mcg by mouth daily before breakfast.   Yes [provider]  LORazepam (ATIVAN) 0.5 MG tablet Take 1-2 tablets (0.5-1 mg total) by mouth 2 (two) times daily as needed (nausea). Patient taking differently: Take 0.5 mg by mouth 2 (two) times daily as needed for anxiety or sleep (nausea).  02/22/20  Yes Nicholas Lose, MD  Mepolizumab (NUCALA) 100 MG SOLR Inject 100 mg into the skin every 28 (twenty-eight) days. 05/13/19  Yes Margaretha Seeds, MD  metoprolol succinate (TOPROL-XL) 25 MG 24 hr tablet Take 37.5 mg by mouth daily.    Yes [provider]  oxyCODONE-acetaminophen (PERCOCET/ROXICET) 5-325 MG tablet Take 1 tablet by mouth every 8 (eight) hours as needed for severe pain. 02/01/20  Yes Nicholas Lose, MD  OXYGEN Inhale 4 L into the lungs continuous.    Yes [provider]  Rimegepant Sulfate (NURTEC) 75 MG TBDP Take 75 mg by mouth daily as needed (Migraine).    Yes [provider]  rosuvastatin (CRESTOR) 10 MG tablet Take 10 mg by mouth daily.  02/28/18  Yes [provider]  silver sulfADIAZINE (SILVADENE) 1 % cream Apply 1 application topically daily as needed (Open Wound).  11/16/19  Yes [provider]  sodium chloride flush 0.9 % SOLN injection 10 mL syringes - #30 count with 1 refill 02/01/20  Yes Nicholas Lose, MD  sucralfate (CARAFATE) 1 g tablet Take 1 g by mouth daily as needed (FOR ULCERS).    Yes [provider]  topiramate (TOPAMAX) 50 MG tablet Take 150 mg by mouth at bedtime.  12/25/19  Yes [provider]  zolpidem (AMBIEN CR) 12.5 MG CR tablet Take 12.5 mg by mouth at bedtime.     Yes [provider]  budesonide (PULMICORT) 0.25 MG/2ML nebulizer solution Take 2 mLs (0.25 mg total) by  nebulization 2 (two) times daily for 5 days. Patient not taking: Reported on 04/01/2020 02/14/20 02/19/20  Cherylann Ratel A, DO  EPINEPHRINE 0.3 mg/0.3 mL IJ SOAJ injection INJECT 0.3 MLS (0.3 MG TOTAL) INTO THE MUSCLE AS NEEDED FOR ANAPHYLAXIS. 12/09/19   Margaretha Seeds, MD  furosemide (LASIX) 40 MG tablet Take 1 tablet (40 mg total) by mouth 2 (two) times daily for 5 doses. Patient not taking: Reported on 04/01/2020 02/14/20 02/17/20  Cherylann Ratel A, DO  hydrocortisone (CORTEF) 10 MG tablet Take 1-2 tablets (10-20 mg total) by mouth See admin instructions. Take 20 mg in the am and 52m in the evening Patient taking differently: Take 10-20 mg by mouth See admin instructions. Take 20 mg by mouth in the morning and 175min the evening 02/17/20   KyCherylann Ratel, DO  sulfamethoxazole-trimethoprim (BACTRIM DS) 800-160 MG tablet Take 1 tablet by mouth 2 (two) times daily. 03/08/20   [provider]    Physical Exam: Vitals:   04/01/20 1456 04/01/20 1546 04/01/20 1706  BP: 123/80 119/88 103/74  Pulse: 99 95 88  Resp: _0 Temp: 98.4 F (36.9 C)    TempSrc: Oral    SpO2: 100% 99% 98%  Weight: 89.8 kg    Height: 5' 5" (1.651 m)      Constitutional: No acute distress Head: Atraumatic Eyes: Conjunctiva clear ENM: Moist mucous membranes. Normal dentition.  Neck: Supple Respiratory: scattered crackles Cardiovascular: Regular rate and rhythm. Soft systolic murmur Abdomen: Non-tender, obese. No masses. No rebound or guarding. Positive bowel sounds. Musculoskeletal: No joint deformity upper and lower extremities. Normal ROM, no contractures. Normal muscle tone.  Skin: few shallow ulcers left third upper digit. Faint redness left forearm Extremities: left upper extremity swelling Neurologic: Alert, moving all 4 extremities. Psychiatric: Normal insight and judgement.  Lines: R subclavian   Labs on Admission: I have personally reviewed following labs and imaging studies  CBC: Recent Labs   Lab 04/01/20 1517  WBC 9.3  NEUTROABS 6.9  HGB 10.8*  HCT 33.9*  MCV 93.4  PLT 28128 Basic Metabolic Panel: Recent Labs  Lab 04/01/20 1517  NA 140  K 3.3*  CL 100  CO2 25  GLUCOSE 109*  BUN 19  CREATININE 1.06*  CALCIUM 8.3*  MG 2.0   GFR: Estimated Creatinine Clearance: 71 mL/min (A) (by C-G formula based on SCr of 1.06 mg/dL (H)). Liver Function Tests: Recent Labs  Lab 04/01/20 1517  AST 29  ALT 38  ALKPHOS 56  BILITOT 0.5  PROT 7.3  ALBUMIN 3.9   No results for input(s): LIPASE, AMYLASE in the last 168 hours. No results for input(s): AMMONIA in the last 168 hours. Coagulation Profile: No results for input(s): INR, PROTIME in the last 168 hours. Cardiac Enzymes: No results for input(s): CKTOTAL, CKMB, CKMBINDEX, TROPONINI in the last 168 hours. BNP (last 3 results) No results for input(s): PROBNP in the last 8760 hours. HbA1C: No results for input(s): HGBA1C in the last 72 hours. CBG: No results for input(s): GLUCAP in the last 168 hours. Lipid Profile: No results for input(s): CHOL, HDL, LDLCALC, TRIG, CHOLHDL, LDLDIRECT in the last 72 hours. Thyroid Function Tests: No results for input(s): TSH, T4TOTAL, FREET4, T3FREE, THYROIDAB in the last 72 hours. Anemia Panel: No results for input(s): VITAMINB12, FOLATE, FERRITIN, TIBC, IRON, RETICCTPCT in the last 72 hours. Urine analysis:    Component Value Date/Time   COLORURINE YELLOW 04/01/2020 1945   APPEARANCEUR CLEAR 04/01/2020 1945   LABSPEC 1.009 04/01/2020 1945   PHURINE 6.0 04/01/2020 1945   GLUCOSEU NEGATIVE 04/01/2020 1945   HGBUR NEGATIVE 04/01/2020 1945   BILIRUBINUR NEGATIVE 04/01/2020 1945   KETONESUR NEGATIVE 04/01/2020 1945   PROTEINUR NEGATIVE 04/01/2020 1945   UROBILINOGEN 1.0 06/16/2015 1045   NITRITE NEGATIVE 04/01/2020 1945   LEUKOCYTESUR NEGATIVE 04/01/2020 1945    Radiological Exams on Admission: DG Chest 2 View  Result Date: 04/01/2020 CLINICAL DATA:  Shortness of breath  EXAM: CHEST -  2 VIEW COMPARISON:  02/10/2020 FINDINGS: Right Port-A-Cath remains in place, unchanged. Heart is normal size. No confluent airspace opacities or effusions. No acute bony abnormality. IMPRESSION: No active cardiopulmonary disease. Electronically Signed   By: Rolm Baptise M.D.   On: 04/01/2020 16:23   UE VENOUS DUPLEX ([facility] & [facility] 7 am - 7 pm)  Result Date: 04/01/2020 UPPER VENOUS STUDY  Indications: Swelling Limitations: Body habitus and poor ultrasound/tissue interface. Comparison Study: No prior studies. Performing Technologist: Oliver Hum RVT  Examination Guidelines: A complete evaluation includes B-mode imaging, spectral Doppler, color Doppler, and power Doppler as needed of all accessible portions of each vessel. Bilateral testing is considered an integral part of a complete examination. Limited examinations for reoccurring indications may be performed as noted.  Right Findings: +----------+------------+---------+-----------+----------+-------+ RIGHT     CompressiblePhasicitySpontaneousPropertiesSummary +----------+------------+---------+-----------+----------+-------+ Subclavian    Full       Yes       Yes                      +----------+------------+---------+-----------+----------+-------+  Left Findings: +----------+------------+---------+-----------+----------+-------+ LEFT      CompressiblePhasicitySpontaneousPropertiesSummary +----------+------------+---------+-----------+----------+-------+ IJV           Full       Yes       No                       +----------+------------+---------+-----------+----------+-------+ Subclavian    Full       Yes       No                       +----------+------------+---------+-----------+----------+-------+ Axillary      Full       Yes       No                       +----------+------------+---------+-----------+----------+-------+ Brachial      Full       Yes       No                        +----------+------------+---------+-----------+----------+-------+ Radial        Full                                          +----------+------------+---------+-----------+----------+-------+ Ulnar         Full                                          +----------+------------+---------+-----------+----------+-------+ Cephalic      Full                                          +----------+------------+---------+-----------+----------+-------+ Basilic       Full                                          +----------+------------+---------+-----------+----------+-------+  Summary:  Right: No evidence of thrombosis in the subclavian.  Left: No evidence of deep vein thrombosis in the upper extremity.  No evidence of superficial vein thrombosis in the upper extremity.  *See table(s) above for measurements and observations.  Diagnosing physician: Monica Martinez MD Electronically signed by Monica Martinez MD on 04/01/2020 at 6:33:43 PM.    Final     EKG: Independently reviewed. nsr  Assessment/Plan Principal Problem:   Nausea Active Problems:   Hx of Hodgkins lymphoma   History of ductal carcinoma in situ (DCIS) of breast   Thyroid cancer (HCC)   Adrenal insufficiency (HCC)   Hypertension   Nephrolithiasis   Oxygen dependent   Migraine   Pituitary adenoma ([place])   History of cervical cancer   Chronic respiratory failure with hypoxia (HCC)   Port-A-Cath in place   Generalized weakness   # Fever - report of temp of 102 yesterday, no fever since. Etiology unclear. Pt reports hx of infections left upper extremity though hx multifactorial process that arm (lymphedema, ongoing w/u for possible vasculitis). No DVT seen on u/s today. No warmth, fever, or leukocytosis, but faint assymetric redness the left arm compared to the right. Swelling in that arm responded to IV lasix last month. Urinalysis and cxr not suggestive of infection. Nausea but no abd pain, vomiting, or diarrhea to  suggest intraabdominal infection. Has been seen by rheum, has not received a rheumatologic diagnosis, but undiagnosed rheumatologic condition could be source of fever. ED consulted pt's hematologist who advised admission for monitoring. - holding on abx, will monitor left arm closely for signs of worsening infection - f/u blood cultures - f/u covid - can consider discussing case w/ Dr. Lindi Adie of hematology who knows patient well  # Left arm swelling - u/s neg for DVT. Responded to IV diuresis last hospitalization - hold home lasix 40 po bid, start 40 IV bid  # Nausea - no hypotension to suggest acute adrenal crisis but some degree of adrenal insuffiency may contribute and responded to stress dose steroids last hospitalization . Received hydrocortisone 100 IV in ED. - stress dose hydrocortisone (60 mg q am and 30 mg q pm from home 20/10 dosing) - cont home benadryl, marinol  # Hypokalemia - mild, k 3.3, mg wnl. S/p 40 meq of K in ED - repeat potassium in AM  # Chronic pain, anxiety - subs dilaudid for home oxycodone, pt says prefers - home clonazepam continuing  # Fibroelastosis, asthma - cont home qvar, breo - on outpt monthly nucala injection, recently received  # Adrenal insufficiency - stress dose steroids as above  # Chronic respiratory failure - stable on home o2 3-4 L [place] - continue O2  # Insomnia - ok to use home zolpidem (requests this)  # Hypothyroid - tsh low, t4 wnl last hospitalization, pt says this is her goal - ok to use home name brand levothyroxine, pt requests  # Gout - home colchicine, asymptomatic  # HTN - bp wnl - cont home metop, rosuvastatin, lasix as above  # Migraine - on outpt monthly galcanezumab, recently received - cont home topamax     DVT prophylaxis: lovenox Code Status: dnr, confirmed w/ pt and husband  Family Communication: husband updated @ bedside  Disposition Plan: tbd  Consults called: none (EDP curbsided hematology but does not  appear there was a formal consultation) Admission status: obs med/surg    Desma Maxim MD [facility] Pager [phone]  If 7PM-7AM, please contact night-coverage [email or web address] Password [facility]  04/01/2020, 9:23 PM

## 2020-04-01 NOTE — Telephone Encounter (Signed)
Attempted to contact pt regarding requested [facility] appt today at 1pm, no answer.  Unable to leave VM.

## 2020-04-01 NOTE — ED Notes (Signed)
Family at bedside.

## 2020-04-01 NOTE — ED Triage Notes (Addendum)
Patient from cancer center, hx breast cancer, thyroid cancer, cervical cancer, and lymphoma. Not currently being treated at cancer center. Hx vasculitis and chronic respiratory failure. C/o fever, seven pound weight gain, and bruising to right arm since yesterday. Reports working in the garden.  Patient accesses port at home and administers benadryl for chronic nausea.   35m Benadryl administered at cancer center.

## 2020-04-01 NOTE — Progress Notes (Signed)
Left upper extremity venous duplex has been completed. Preliminary results can be found in CV Proc through chart review.  Results were given to Dr. Eulis Foster.  04/01/20 4:37 PM [name] RVT

## 2020-04-01 NOTE — Telephone Encounter (Signed)
This RN called pt per her request to be seen today by [facility]- with RN in [facility] unable to reach pt.  Per call- went to a VM which then said VM not available.  Noted pt sent a mychart message- this RN sent a mychart response stating [facility] can see her at 1pm but need her to respond asap so she can be scheduled appropriately.  Will await response.

## 2020-04-01 NOTE — Progress Notes (Signed)
[facility]pt[facility] Progress Note   ED RAYSON [id number] 18-Mar-1971 49 y.o.  [name] is managed by Dr. Nicholas Lose  Actively treated with chemotherapy/immunotherapy/hormonal therapy: no  Next scheduled appointment with provider: 02/15/2020  Assessment: Plan:    Nausea without vomiting - Plan: diphenhydrAMINE (BENADRYL) injection 50 mg, 0.9 %  sodium chloride infusion  Thyroid cancer ([place])  Hx of Hodgkins lymphoma  History of ductal carcinoma in situ (DCIS) of breast  Raynaud's phenomenon without gangrene  Weight gain, abnormal  Fever, unspecified fever cause   Lymphedema, fever, unexplained weight gain: The patient was taken to the emergency room for evaluation and management.  History of thyroid cancer, history of cervical cancer, history of ductal carcinoma in situ of the breast, history of Hodgkin's lymphoma, and history of a thrombus of the right lower extremity and left upper extremity and a history of a manubriosternal lesion and left arm nodule of unknown etiology: The patient continues to be followed by Dr. Nicholas Lose and is scheduled to be seen next on 08/17/2020.  Please see After Visit Summary for patient specific instructions.  Future Appointments  Date Time Provider [place]  04/12/2020 10:30 AM [facility] [place] [facility] None  05/10/2020 10:30 AM [facility] None  06/07/2020 10:30 AM [facility] [place] [facility] None  07/05/2020 10:30 AM [facility] [place] [facility] None  08/02/2020 10:30 AM [facility] [place] [facility] None  08/17/2020  3:30 PM Nicholas Lose, MD [facility] None  08/30/2020 10:30 AM [facility] [place] [facility] None  09/27/2020 10:30 AM [facility] [place] [facility] None  10/25/2020 10:30 AM [facility] [place] [facility] None  11/22/2020 10:30 AM [facility] [place] [facility] None    No orders of the defined types were placed in this encounter.      Subjective:   Patient ID:   [name] is a 49 y.o. (DOB [birth year]) female.  Chief Complaint:  Chief Complaint  Patient presents with  . Arm Swelling    Left    HPI [name]  is a 49 y.o. female with a complex oncologic and hematologic history that includes a history of ductal carcinoma in situ, Hodgkin's lymphoma, thyroid cancer, cervical cancer, and an acute superficial venous thrombus of the right lower extremity.  She is managed by Dr. Nicholas Lose and is being followed with conservative surveillance. She was last seen on 02/05/2020. She has a history of Raynaud's.  She has had her left index finger amputated after having an injury and an infection.  She presents to the clinic today with a report that she has had a 7 pound weight gain since yesterday and has increasing upper and lower extremity lymphedema.  She was working in the garden recently with her husband when he threw a plant to her and which she reports was part of a cactus that punctured her hand in multiple locations.  She has had a fever for the past 24 hours and has noted erythema in her left upper extremity.  She did not reach out to her primary care provider as her primary care provider is out of town.  She reports nausea today for which she takes Benadryl.  She has a Port-A-Cath which she believes accessed and gives herself IV Benadryl at home.  Medications: I have reviewed the patient's current medications.  Allergies:  Allergies  Allergen Reactions  . Mushroom Extract Complex Anaphylaxis  . Na Ferric Gluc Cplx In Sucrose Anaphylaxis  . Cymbalta [Duloxetine Hcl]  Swelling and Anxiety  . Succinylcholine Other (See Comments)    Lock Jaw  . Buprenorphine Hcl Hives  . Compazine Other (See Comments)    Altered mental status Aggression  . Metoclopramide Other (See Comments)    Dystonia  . Morphine And Related Hives  . Ondansetron Hives and Rash  . Promethazine Hcl Hives  . Tegaderm Ag Mesh [Silver] Rash    Old formulation only, is able to  tolerate new formulation    Past Medical History:  Diagnosis Date  . Addison's disease ([place])   . Adrenal insufficiency ([place])   . Anemia   . Anxiety   . Aortic stenosis   . Aortic stenosis   . Appendicitis 12/19/09  . Appendicitis   . Breast cancer ([place])    STATUS POST BILATERAL MASTECTOMY. STATUS POST RECONSTRUCTION. SHE HAD SILICONE BREAST IMPLANTS AND THE LEFT IMPLANT IS LEAKING SLIGHTLY  . Cellulitis of right middle finger 11/07/2018  . Cervical cancer ([place]) 12/23/2018  . Chest pain   . CHF with right heart failure ([place]) 04/17/2017  . Chronic respiratory failure with hypoxia ([place]) 12/23/2018  . Cough variant asthma 04/13/2019  . Depression   . GERD (gastroesophageal reflux disease)   . Headache    migraines  . Heart murmur   . History of kidney stones   . Hodgkin lymphoma ([facility])    STATUS POST MANTLE RADIATION  . Hodgkin's lymphoma ([place])    1987  . Hypertension   . Hypoxia   . Necrotizing fasciitis ([place]) 12/23/2018  . Non-ischemic cardiomyopathy ([place])   . Osteoporosis   . Palpitations   . Pituitary adenoma ([place]) 12/23/2018  . Pneumonia   . PONV (postoperative nausea and vomiting)   . Pre-diabetes    per pt; no meds  . Pulmonary hypertension ([place]) 12/23/2018  . Raynaud phenomenon   . Right heart failure ([place]) 04/17/2017  . Supplemental oxygen dependent    3 liters  . SVT (supraventricular tachycardia) ([place])   . Tachycardia   . Thyroid cancer ([place])    STATUS POST SURGICAL REMOVAL-CURRENT ON THYROID REPLACEMENT    Past Surgical History:  Procedure Laterality Date  . ABDOMINAL HYSTERECTOMY    . AMPUTATION Left 01/30/2019   Procedure: Left Index finger amputation with flap reconstruction and repair reconstruction;  Surgeon: Roseanne Kaufman, MD;  Location: [facility];  Service: Orthopedics;  Laterality: Left;  . APPENDECTOMY    . CARDIAC CATHETERIZATION  05/18/09   NORMAL CATH  . I & D EXTREMITY Left 12/23/2018   Procedure: IRRIGATION AND DEBRIDEMENT HAND / INDEX FINGER;   Surgeon: Roseanne Kaufman, MD;  Location: [facility];  Service: Orthopedics;  Laterality: Left;  Marland Kitchen MASTECTOMY    . PITUITARY SURGERY    . RIGHT/LEFT HEART CATH AND CORONARY ANGIOGRAPHY N/A 04/02/2018   Procedure: RIGHT/LEFT HEART CATH AND CORONARY ANGIOGRAPHY;  Surgeon: Burnell Blanks, MD;  Location: [facility];  Service: Cardiovascular;  Laterality: N/A;  . TOTAL THYROIDECTOMY    . VIDEO BRONCHOSCOPY Bilateral 11/14/2018   Procedure: VIDEO BRONCHOSCOPY WITHOUT FLUORO;  Surgeon: Margaretha Seeds, MD;  Location: [facility];  Service: Cardiopulmonary;  Laterality: Bilateral;  . VIDEO BRONCHOSCOPY WITH ENDOBRONCHIAL ULTRASOUND N/A 11/19/2018   Procedure: VIDEO BRONCHOSCOPY WITH ENDOBRONCHIAL ULTRASOUND;  Surgeon: Margaretha Seeds, MD;  Location: [facility];  Service: Thoracic;  Laterality: N/A;    Family History  Family history unknown: Yes    Social History   Socioeconomic History  . Marital status: Married    Spouse name:  Not on file  . Number of children: Not on file  . Years of education: Not on file  . Highest education level: Not on file  Occupational History  . Not on file  Tobacco Use  . Smoking status: Never Smoker  . Smokeless tobacco: Never Used  Vaping Use  . Vaping Use: Never used  Substance and Sexual Activity  . Alcohol use: Yes    Comment: social   . Drug use: No  . Sexual activity: Yes    Birth control/protection: Surgical  Other Topics Concern  . Not on file  Social History Narrative  . Not on file   Social Determinants of Health   Financial Resource Strain:   . Difficulty of Paying Living Expenses:   Food Insecurity:   . Worried About Charity fundraiser in the Last Year:   . Arboriculturist in the Last Year:   Transportation Needs:   . Film/video editor (Medical):   Marland Kitchen Lack of Transportation (Non-Medical):   Physical Activity:   . Days of Exercise per Week:   . Minutes of Exercise per Session:   Stress:   . Feeling of Stress :     Social Connections:   . Frequency of Communication with Friends and Family:   . Frequency of Social Gatherings with Friends and Family:   . Attends Religious Services:   . Active Member of Clubs or Organizations:   . Attends Archivist Meetings:   Marland Kitchen Marital Status:   Intimate Partner Violence:   . Fear of Current or Ex-Partner:   . Emotionally Abused:   Marland Kitchen Physically Abused:   . Sexually Abused:     Past Medical History, Surgical history, Social history, and Family history were reviewed and updated as appropriate.   Please see review of systems for further details on the patient's review from today.   Review of Systems:  Review of Systems  Constitutional: Positive for fever and unexpected weight change. Negative for chills and diaphoresis.  HENT: Negative for facial swelling and trouble swallowing.   Respiratory: Positive for shortness of breath. Negative for cough and chest tightness.   Cardiovascular: Positive for leg swelling. Negative for chest pain.  Skin: Positive for color change and rash.    Objective:   Physical Exam:  BP 118/79 (BP Location: Right Arm, Patient Position: Sitting)   Pulse (!) 103   Temp 98.7 F (37.1 C) (Oral)   Resp 17   Ht _0  (1.651 m)   Wt 198 lb 1.6 oz (89.9 kg)   LMP  (LMP Unknown)   SpO2 99%   BMI 32.97 kg/m  ECOG: 2  Physical Exam Constitutional:      General: She is not in acute distress.    Appearance: She is not ill-appearing or diaphoretic.     Comments: The patient is an adult female who appears to be chronically ill.  She is receiving O2 via nasal cannula.  HENT:     Head: Normocephalic and atraumatic.     Mouth/Throat:     Mouth: Mucous membranes are dry.     Pharynx: Oropharynx is clear.  Eyes:     General: No scleral icterus.       Right eye: No discharge.        Left eye: No discharge.     Conjunctiva/sclera: Conjunctivae normal.  Cardiovascular:     Rate and Rhythm: Regular rhythm. Tachycardia present.      Heart sounds: Normal heart  sounds. No murmur heard.  No friction rub. No gallop.   Pulmonary:     Effort: Pulmonary effort is normal. No respiratory distress.     Breath sounds: Normal breath sounds. No wheezing or rales.  Musculoskeletal:        General: Swelling present.     Right lower leg: Edema present.     Left lower leg: Edema present.     Comments: Upper and lower extremities show trace to 1+ pitting edema bilaterally.  Skin:    General: Skin is warm and dry.     Findings: Erythema present. No rash.     Comments: Erythema of the left upper extremity without increased warmth.  Neurological:     Mental Status: She is alert.     Gait: Gait abnormal (The patient is ambulating with the use of a wheelchair today.).  Psychiatric:     Comments: The patient is intermittently tearful.     Lab Review:     Component Value Date/Time   NA 143 02/14/2020 0500   NA 141 04/01/2018 1617   K 3.7 02/14/2020 0500   CL 105 02/14/2020 0500   CO2 29 02/14/2020 0500   GLUCOSE 99 02/14/2020 0500   BUN 18 02/14/2020 0500   BUN 11 04/01/2018 1617   CREATININE 0.85 02/14/2020 0500   CREATININE 0.99 02/10/2020 1020   CALCIUM 9.3 02/14/2020 0500   PROT 6.5 02/11/2020 0530   PROT 7.1 01/23/2018 1225   ALBUMIN 3.9 02/14/2020 0500   ALBUMIN 4.4 01/23/2018 1225   AST 35 02/11/2020 0530   AST 36 02/10/2020 1020   ALT 36 02/11/2020 0530   ALT 43 02/10/2020 1020   ALKPHOS 47 02/11/2020 0530   BILITOT 0.6 02/11/2020 0530   BILITOT 0.4 02/10/2020 1020   GFRNONAA >60 02/14/2020 0500   GFRNONAA >60 02/10/2020 1020   GFRAA >60 02/14/2020 0500   GFRAA >60 02/10/2020 1020       Component Value Date/Time   WBC 9.3 04/01/2020 1517   RBC 3.63 (L) 04/01/2020 1517   HGB 10.8 (L) 04/01/2020 1517   HGB 12.7 02/10/2020 1020   HGB 12.0 12/21/2019 1418   HCT 33.9 (L) 04/01/2020 1517   HCT 39.9 04/01/2018 1617   PLT 287 04/01/2020 1517   PLT 297 02/10/2020 1020   PLT 327 04/01/2018 1617   MCV  93.4 04/01/2020 1517   MCV 89 04/01/2018 1617   MCH 29.8 04/01/2020 1517   MCHC 31.9 04/01/2020 1517   RDW 15.2 04/01/2020 1517   RDW 13.6 04/01/2018 1617   LYMPHSABS 1.7 04/01/2020 1517   MONOABS 0.6 04/01/2020 1517   EOSABS 0.1 04/01/2020 1517   BASOSABS 0.1 04/01/2020 1517   -------------------------------  Imaging from last 24 hours (if applicable):  Radiology interpretation: DG Chest 2 View  Result Date: 04/01/2020 CLINICAL DATA:  Shortness of breath EXAM: CHEST - 2 VIEW COMPARISON:  02/10/2020 FINDINGS: Right Port-A-Cath remains in place, unchanged. Heart is normal size. No confluent airspace opacities or effusions. No acute bony abnormality. IMPRESSION: No active cardiopulmonary disease. Electronically Signed   By: Rolm Baptise M.D.   On: 04/01/2020 16:23        This case was discussed with Dr. Lindi Adie. He expresses agreement with my management of this patient.

## 2020-04-02 DIAGNOSIS — R110 Nausea: Secondary | ICD-10-CM | POA: Diagnosis not present

## 2020-04-02 LAB — BASIC METABOLIC PANEL
Anion gap: 11 (ref 5–15)
BUN: 20 mg/dL (ref 6–20)
CO2: 27 mmol/L (ref 22–32)
Calcium: 8 mg/dL — ABNORMAL LOW (ref 8.9–10.3)
Chloride: 98 mmol/L (ref 98–111)
Creatinine, Ser: 1.14 mg/dL — ABNORMAL HIGH (ref 0.44–1.00)
GFR calc Af Amer: >60 mL/min (ref >60)
GFR calc non Af Amer: 56 mL/min — ABNORMAL LOW (ref >60)
Glucose, Bld: 178 mg/dL — ABNORMAL HIGH (ref 70–99)
Potassium: 3.5 mmol/L (ref 3.5–5.1)
Sodium: 136 mmol/L (ref 135–145)

## 2020-04-02 MED ORDER — VANCOMYCIN HCL IN DEXTROSE 1-5 GM/200ML-% IV SOLN
1000.0000 mg | Freq: Two times a day (BID) | INTRAVENOUS | Status: DC
  Administered 2020-04-03 – 2020-04-04 (×3): 1000 mg via INTRAVENOUS
  Filled 2020-04-02 (×4): qty 200

## 2020-04-02 MED ORDER — PIPERACILLIN-TAZOBACTAM 3.375 G IVPB
3.3750 g | Freq: Three times a day (TID) | INTRAVENOUS | Status: AC
  Administered 2020-04-02 – 2020-04-05 (×9): 3.375 g via INTRAVENOUS
  Filled 2020-04-02 (×9): qty 50

## 2020-04-02 MED ORDER — VANCOMYCIN HCL IN DEXTROSE 1-5 GM/200ML-% IV SOLN: 1000.0000 mg | Freq: Once | INTRAVENOUS | Status: DC

## 2020-04-02 MED ORDER — SODIUM CHLORIDE 0.9% FLUSH: 10.0000 mL | INTRAVENOUS | Status: DC | PRN

## 2020-04-02 MED ORDER — CHLORHEXIDINE GLUCONATE CLOTH 2 % EX PADS
6.0000 | MEDICATED_PAD | Freq: Every day | CUTANEOUS | Status: DC
  Administered 2020-04-02 – 2020-04-10 (×8): 6 via TOPICAL

## 2020-04-02 MED ORDER — VANCOMYCIN HCL 1750 MG/350ML IV SOLN
1750.0000 mg | Freq: Once | INTRAVENOUS | Status: AC
  Administered 2020-04-02: 1750 mg via INTRAVENOUS
  Filled 2020-04-02: qty 350

## 2020-04-02 MED ORDER — GUAIFENESIN ER 600 MG PO TB12
600.0000 mg | ORAL_TABLET | Freq: Two times a day (BID) | ORAL | Status: DC
  Administered 2020-04-02 – 2020-04-03 (×2): 600 mg via ORAL
  Filled 2020-04-02 (×2): qty 1

## 2020-04-02 MED ORDER — FUROSEMIDE 10 MG/ML IJ SOLN
40.0000 mg | Freq: Every day | INTRAMUSCULAR | Status: DC
  Administered 2020-04-03: 40 mg via INTRAVENOUS
  Filled 2020-04-02 (×2): qty 4

## 2020-04-02 MED ORDER — PIPERACILLIN-TAZOBACTAM 3.375 G IVPB 30 MIN: 3.3750 g | Freq: Once | INTRAVENOUS | Status: DC

## 2020-04-02 NOTE — Plan of Care (Signed)
Plan of care discussed.

## 2020-04-02 NOTE — Progress Notes (Signed)
Pharmacy Antibiotic Note  [name] is a 49 y.o. female with hxHodgkin's lymphoma, Breast and thyroid cancer presented to the ED on 04/01/2020 for workup of fever, left arm swelling and weight loss. Pharmacy is consulted to start zosyn and vancomycin for suspected cellulitis.  Plan: - vancomycin 1750 mg IV x1, then 1000 mg IV q12h - zosyn 3.375 gm IV q8h (infuse over 4 hrs) - daily scr _________________________________  Height: _0  (165.1 cm) Weight: 89.8 kg (198 lb) IBW/kg (Calculated) : 57  Temp (24hrs), Avg:98.3 F (36.8 C), Min:98 F (36.7 C), Max:98.7 F (37.1 C)  Recent Labs  Lab 04/01/20 1517 04/02/20 0408  WBC 9.3  --   CREATININE 1.06* 1.14*    Estimated Creatinine Clearance: 66.1 mL/min (A) (by C-G formula based on SCr of 1.14 mg/dL (H)).    Allergies  Allergen Reactions   Mushroom Extract Complex Anaphylaxis   Na Ferric Gluc Cplx In Sucrose Anaphylaxis   Cymbalta [Duloxetine Hcl] Swelling and Anxiety   Succinylcholine Other (See Comments)    Lock Jaw   Buprenorphine Hcl Hives   Compazine Other (See Comments)    Altered mental status Aggression   Metoclopramide Other (See Comments)    Dystonia   Morphine And Related Hives   Ondansetron Hives and Rash   Promethazine Hcl Hives   Tegaderm Ag Mesh [Silver] Rash    Old formulation only, is able to tolerate new formulation     Thank you for allowing pharmacy to be a part of this patients care.  Lynelle Doctor 04/02/2020 11:53 AM

## 2020-04-02 NOTE — Progress Notes (Signed)
PROGRESS NOTE  [name]  DOB: [birth year]  PCP: Susan Grand, MD JME:[id number]  DOA: 04/01/2020  LOS: 0 days   Chief Complaint  Patient presents with  . Arm Swelling   Brief narrative: [name] is a 49 y.o. 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 Patient presented to the ED on 7/2 for worsening left arm swelling and redness.  Patient had similar presentation and admission last month.  She was treated with IV Lasix and antibiotics, she was discharged on a course of cefdinir and continued on oral Lasix.   She recently noticed recurrence of redness and swelling and was started on a course of Bactrim as an outpatient.  Redness and swelling continued to worsen, she also had a fever of 102 at home.  She was seen at 'symptom management clinic' at her oncologist office.  She was sent to the ED for IV antibiotics  As an outpatient she is followed by dermatology and rheumatology at [facility], [facility], [facility], [facility], and neurology and gastroenterology at [facility].  In the ED, she was afebrile, heart rate in 80s and 90s, blood pressure normal range, oxygen saturation more than 95% on 3 L by nasal cannula. Labs unremarkable Ultrasound upper extremities negative for DVT. Chest x-ray normal. Patient was admitted to hospitalist service for further evaluation and management  Subjective: Patient was seen and examined this morning.  Pleasant middle-aged Caucasian female.  Not in physical distress.  Continues to have bilateral lower extremity  edema more left and right.  Left one has mild cellulitis changes as well. Chart reviewed. No fever. Remains hemodynamically stable.  Assessment/Plan: Fever at home Worsening left arm swelling -Reports a fever of 102 at home 1 day prior to presentation. -Reports history of recurrent left upper extremity infection.   -Reports improvement in swelling last month with IV Lasix and also a course of Bactrim.  -No fever documented in the hospital.  WBC count normal.   -Ultrasound with no evidence of upper extremity DVT.  -Although patient does not seem septic at this time, patient has a significant history of recurrent cellulitis and is immunocompromised. -I will start the patient on broad-spectrum antibiotic coverage with IV Zosyn and IV vancomycin. -Follow blood cultures. -At home, patient was on Lasix 40 mg daily.  She has been started on Lasix 40 mg IV twice daily here.  I would reduce it to 40 mg IV daily. -Echocardiogram from May 2021 with EF 65 to 70%, normal LV function, normal RV function, moderate tricuspid regurgitation, pulmonary artery systolic pressure mildly elevated to 35. -Continue to monitor clinically. Repeat labs in the morning.  Adrenal insufficiency -Takes hydrocortisone 20 mg in the morning and 10 mg in the evening. -Currently she is on IV hydrocortisone. Plan to resume oral tomorrow.  History of asthma/fibroelastosis Chronic respite failure with hypoxia -cont home qvar, breo, Tessalon Perles -on outpt monthly nucala injection, recently received -On 3-4 l oxygen by nasal collar at home  Chronic pain, anxiety, depression -On oxycodone, Lexapro and clonazepam at home.   -Currently on Dilaudid.  Minimize use of IV opiates. -Continue home benadryl, marinol  Hypokalemia  -mild, k 3.3, mg  wnl. S/p 40 meq of K in ED -Potassium level this morning is normal at 3.5.  Insomnia - ok to use home zolpidem  Hypothyroid - tsh low, t4 wnl last hospitalization, pt says this is  her goal -ok to use home name brand levothyroxine, pt requests  Gout - home colchicine, asymptomatic  HTN  -Blood pressure normal with metoprolol and Lasix.  Also continue aspirin 325 mg daily, rosuvastatin.  Migraine - on outpt monthly galcanezumab, recently received - cont home topamax  GERD -PPI  DVT prophylaxis: lovenox Code Status: dnr, confirmed w/ pt and husband  Family Communication: husband updated @ bedside  Disposition Plan: tbd  Consults called: none (EDP curbsided hematology but does not appear there was a formal consultation) Admission status: obs med/surg    Mobility: Encourage ambulation. Code Status:   Code Status: DNR.  Per H&P, this was confirmed with patient and husband. Nutritional status: Body mass index is 32.95 kg/m.     Diet Order            Diet regular Room service appropriate? Yes; Fluid consistency: Thin  Diet effective now                 DVT prophylaxis: enoxaparin (LOVENOX) injection 40 mg Start: 04/01/20 2230   Antimicrobials:  None Fluid: Normal saline KVO  Consultants: Oncology Family Communication:  None at bedside  Status is: Observation  The patient will require care spanning > 2 midnights and should be moved to inpatient because: Ongoing diagnostic testing needed not appropriate for outpatient work up and IV treatments appropriate due to intensity of illness or inability to take PO  Dispo: The patient is from: Home              Anticipated d/c is to: Home              Anticipated d/c date is: 2 days              Patient currently is not medically stable to d/c.       Infusions:  . sodium chloride 10 mL/hr at 04/02/20 0600    Scheduled Meds: . aspirin  325 mg Oral QHS  . budesonide (PULMICORT) nebulizer solution  0.25 mg Nebulization BID  . Chlorhexidine Gluconate Cloth  6 each Topical Daily  . clonazePAM  1 mg Oral BID  . colchicine  0.6 mg Oral Q1500  . dexlansoprazole  60 mg Oral Daily  . enoxaparin  (LOVENOX) injection  40 mg Subcutaneous QHS  . escitalopram  20 mg Oral QPM  . fluticasone furoate-vilanterol  1 puff Inhalation Daily  . furosemide  40 mg Intravenous BID  . hydrocortisone  10 mg Oral QPM  . hydrocortisone  20 mg Oral Q breakfast  . levothyroxine  112 mcg Oral Q0600  . metoprolol succinate  37.5 mg Oral Daily  . rosuvastatin  10 mg Oral Daily  . topiramate  150 mg Oral QHS  . zolpidem  10 mg Oral QHS    Antimicrobials: Anti-infectives (From admission, onward)   None      PRN meds: sodium chloride, albuterol, diphenhydrAMINE, dronabinol, HYDROmorphone (DILAUDID) injection, LORazepam, Rimegepant Sulfate, sodium chloride flush, sucralfate   Objective: Vitals:   04/02/20 0821 04/02/20 0830  BP:  108/74  Pulse: 83 88  Resp: 16   Temp:    SpO2: 98%     Intake/Output Summary (Last 24 hours) at 04/02/2020 0848 Last data filed at 04/02/2020 0600 Gross per 24 hour  Intake  897.19 ml  Output 350 ml  Net 547.19 ml   Filed Weights   04/01/20 1456  Weight: 89.8 kg   Weight change:  Body mass index is 32.95 kg/m.   Physical Exam: General exam: Appears calm and comfortable.  Skin: No rashes, lesions or ulcers. HEENT: Atraumatic, normocephalic, supple neck, no obvious bleeding Lungs: Clear to auscultation bilaterally CVS: Regular rate and rhythm, no murmur GI/Abd soft, nontender, nondistended, bowel sound present CNS: Alert, awake, oriented x3  Psychiatry: Mood appropriate Extremities: No pedal edema, no calf tenderness, bilateral upper extremity swelling present more on the left than the right, left upper extremity also had patches of cellulitic changes.  Data Review: I have personally reviewed the laboratory data and studies available.  Recent Labs  Lab 04/01/20 1517  WBC 9.3  NEUTROABS 6.9  HGB 10.8*  HCT 33.9*  MCV 93.4  PLT 287   Recent Labs  Lab 04/01/20 1517 04/02/20 0408  NA 140 136  K 3.3* 3.5  CL 100 98  CO2 25 27  GLUCOSE 109* 178*    BUN 19 20  CREATININE 1.06* 1.14*  CALCIUM 8.3* 8.0*  MG 2.0  --     Signed, Terrilee Croak, MD [facility] Pager: [phone] (Secure Chat preferred). 04/02/2020

## 2020-04-03 ENCOUNTER — Inpatient Hospital Stay (HOSPITAL_COMMUNITY): Payer: BC Managed Care – PPO

## 2020-04-03 DIAGNOSIS — N182 Chronic kidney disease, stage 2 (mild): Secondary | ICD-10-CM | POA: Diagnosis present

## 2020-04-03 DIAGNOSIS — I350 Nonrheumatic aortic (valve) stenosis: Secondary | ICD-10-CM | POA: Diagnosis present

## 2020-04-03 DIAGNOSIS — E876 Hypokalemia: Secondary | ICD-10-CM | POA: Diagnosis present

## 2020-04-03 DIAGNOSIS — G8929 Other chronic pain: Secondary | ICD-10-CM | POA: Diagnosis present

## 2020-04-03 DIAGNOSIS — Z20822 Contact with and (suspected) exposure to covid-19: Secondary | ICD-10-CM | POA: Diagnosis present

## 2020-04-03 DIAGNOSIS — J9611 Chronic respiratory failure with hypoxia: Secondary | ICD-10-CM | POA: Diagnosis not present

## 2020-04-03 DIAGNOSIS — K219 Gastro-esophageal reflux disease without esophagitis: Secondary | ICD-10-CM | POA: Diagnosis present

## 2020-04-03 DIAGNOSIS — Z66 Do not resuscitate: Secondary | ICD-10-CM | POA: Diagnosis present

## 2020-04-03 DIAGNOSIS — I130 Hypertensive heart and chronic kidney disease with heart failure and stage 1 through stage 4 chronic kidney disease, or unspecified chronic kidney disease: Secondary | ICD-10-CM | POA: Diagnosis present

## 2020-04-03 DIAGNOSIS — I5082 Biventricular heart failure: Secondary | ICD-10-CM | POA: Diagnosis present

## 2020-04-03 DIAGNOSIS — L03114 Cellulitis of left upper limb: Secondary | ICD-10-CM | POA: Diagnosis present

## 2020-04-03 DIAGNOSIS — J4550 Severe persistent asthma, uncomplicated: Secondary | ICD-10-CM | POA: Diagnosis present

## 2020-04-03 DIAGNOSIS — E2740 Unspecified adrenocortical insufficiency: Secondary | ICD-10-CM | POA: Diagnosis not present

## 2020-04-03 DIAGNOSIS — R7881 Bacteremia: Secondary | ICD-10-CM | POA: Diagnosis present

## 2020-04-03 DIAGNOSIS — G43909 Migraine, unspecified, not intractable, without status migrainosus: Secondary | ICD-10-CM | POA: Diagnosis present

## 2020-04-03 DIAGNOSIS — R531 Weakness: Secondary | ICD-10-CM | POA: Diagnosis not present

## 2020-04-03 DIAGNOSIS — G4700 Insomnia, unspecified: Secondary | ICD-10-CM | POA: Diagnosis present

## 2020-04-03 DIAGNOSIS — I424 Endocardial fibroelastosis: Secondary | ICD-10-CM | POA: Diagnosis present

## 2020-04-03 DIAGNOSIS — M810 Age-related osteoporosis without current pathological fracture: Secondary | ICD-10-CM | POA: Diagnosis present

## 2020-04-03 DIAGNOSIS — E271 Primary adrenocortical insufficiency: Secondary | ICD-10-CM | POA: Diagnosis not present

## 2020-04-03 DIAGNOSIS — F419 Anxiety disorder, unspecified: Secondary | ICD-10-CM | POA: Diagnosis present

## 2020-04-03 DIAGNOSIS — I890 Lymphedema, not elsewhere classified: Secondary | ICD-10-CM | POA: Diagnosis present

## 2020-04-03 DIAGNOSIS — I5022 Chronic systolic (congestive) heart failure: Secondary | ICD-10-CM | POA: Diagnosis present

## 2020-04-03 DIAGNOSIS — F329 Major depressive disorder, single episode, unspecified: Secondary | ICD-10-CM | POA: Diagnosis present

## 2020-04-03 DIAGNOSIS — I428 Other cardiomyopathies: Secondary | ICD-10-CM | POA: Diagnosis present

## 2020-04-03 DIAGNOSIS — R110 Nausea: Secondary | ICD-10-CM | POA: Diagnosis not present

## 2020-04-03 DIAGNOSIS — D849 Immunodeficiency, unspecified: Secondary | ICD-10-CM | POA: Diagnosis present

## 2020-04-03 LAB — BLOOD CULTURE ID PANEL (REFLEXED)

## 2020-04-03 LAB — CREATININE, SERUM
Creatinine, Ser: 1.12 mg/dL — ABNORMAL HIGH (ref 0.44–1.00)
GFR calc Af Amer: >60 mL/min (ref >60)
GFR calc non Af Amer: 58 mL/min — ABNORMAL LOW (ref >60)

## 2020-04-03 MED ORDER — GUAIFENESIN ER 600 MG PO TB12
1200.0000 mg | ORAL_TABLET | Freq: Two times a day (BID) | ORAL | Status: DC
  Administered 2020-04-03 – 2020-04-05 (×5): 1200 mg via ORAL
  Filled 2020-04-03 (×5): qty 2

## 2020-04-03 MED ORDER — FUROSEMIDE 10 MG/ML IJ SOLN
40.0000 mg | Freq: Two times a day (BID) | INTRAMUSCULAR | Status: DC
  Administered 2020-04-03 – 2020-04-04 (×2): 40 mg via INTRAVENOUS
  Filled 2020-04-03 (×2): qty 4

## 2020-04-03 MED ORDER — HYDROMORPHONE HCL 1 MG/ML IJ SOLN
0.5000 mg | INTRAMUSCULAR | Status: DC | PRN
  Administered 2020-04-03 – 2020-04-10 (×43): 0.5 mg via INTRAVENOUS
  Filled 2020-04-03 (×45): qty 0.5

## 2020-04-03 MED ORDER — DIPHENHYDRAMINE HCL 50 MG/ML IJ SOLN
50.0000 mg | INTRAMUSCULAR | Status: DC | PRN
  Administered 2020-04-03 – 2020-04-10 (×43): 50 mg via INTRAVENOUS
  Filled 2020-04-03 (×44): qty 1

## 2020-04-03 MED ORDER — OXYCODONE-ACETAMINOPHEN 5-325 MG PO TABS: 1.0000 | ORAL_TABLET | Freq: Three times a day (TID) | ORAL | Status: DC | PRN

## 2020-04-03 MED ORDER — FUROSEMIDE 10 MG/ML IJ SOLN: 40.0000 mg | Freq: Two times a day (BID) | INTRAMUSCULAR | Status: DC

## 2020-04-03 NOTE — Progress Notes (Signed)
PROGRESS NOTE  [name]  DOB: 05/04/1971  PCP: Su Grand, MD QQP:[id number]  DOA: 04/01/2020  LOS: 0 days   Chief Complaint  Patient presents with  . Arm Swelling   Brief narrative: DEWEY NEUKAM is a 49 y.o. female with PMH significant for multiple site cancers (breast cancer, hodgkins lymphoma, thyroid cancer s/p resection, cervical cancer - all in remission), chest lymphoma, vasculitis, post-surgical hypothyroidism, pituitary tumor s/p resection, adrenal insufficiency on steroids, nephrolithiasis, venous thrombosis, migraines, asthma, right heart failure, chronic O2 (3-4 L [place]), HTN, and recurrent ulcers and swelling of left arm thought to be possible vasculitis. Patient presented to the ED on 7/2 for worsening left arm swelling and redness.  Patient had similar presentation and admission last month.  She was treated with IV Lasix and antibiotics, she was discharged on a course of cefdinir and continued on oral Lasix.   She recently noticed recurrence of redness and swelling and was started on a course of Bactrim as an outpatient.  Redness and swelling continued to worsen, she also had a fever of 102 at home.  She was seen at 'symptom management clinic' at her oncologist office.  She was sent to the ED for IV antibiotics  As an outpatient she is followed by dermatology and rheumatology at [facility], [facility], [place] [facility], [facility], and neurology and gastroenterology at [facility].  In the ED, she was afebrile, heart rate in 80s and 90s, blood pressure normal range, oxygen saturation more than 95% on 3 L by nasal cannula. Labs unremarkable Ultrasound upper extremities negative for DVT. Chest x-ray normal. Patient was admitted to hospitalist service for further evaluation and management  Subjective: Patient was seen and examined this morning.  Lying on bed.  Not in distress. Continues to have nausea.  Cellulitis in upper extremity remains active.  Patient  is having episodes of mild cough.  She had sudden episode while I was in the room and it resulted her to have hemoptysis. Chart reviewed. No fever noted since admission. On 3 L oxygen by nasal cannula. Blood pressure in low normal range. Heart rate in 80s. Left this morning with creatinine stable at 1.12.  Assessment/Plan: Recurrent cellulitis Immunocompromise status -Patient presented with worsening left arm swelling and fever of 102 at home. -Reports history of recurrent left upper extremity infection.   -Reports improvement in swelling last month with IV Lasix and also a course of Bactrim.   -No fever documented in the hospital. WBC count normal.   -Ultrasound with no evidence of upper extremity DVT.  -Patient has a significant history of recurrent cellulitis and is immunocompromised. -Patient is currently on broad-spectrum antibiotic coverage with IV Zosyn and IV vancomycin. -Follow blood cultures. -At home, patient was on Lasix 40 mg twice daily. She is currently on Lasix IV 40 mg twice daily. -Echocardiogram from May 2021 with EF 65 to 70%, normal LV function, normal RV function, moderate tricuspid regurgitation, pulmonary artery systolic pressure mildly elevated to 35. -Continue to monitor clinically. Repeat labs in the morning.  Adrenal insufficiency -Takes hydrocortisone 20 mg in the morning and 10 mg in the evening. -Same resumed.  Hemoptysis Comment acquired pneumonia History of asthma/fibroelastosis Chronic respiratory failure with hypoxia -One episode of hemoptysis this morning.  Has intermittent aggressive coughing spells.   -Continue board spectrum IV antibiotics.  Cont home qvar, breo, Tessalon Perles -on outpt monthly nucala injection, recently received -On 3-4 l oxygen by nasal cannula at home  Chronic pain, anxiety, depression -  On oxycodone, Lexapro and clonazepam at home.   -Same have been resumed. -Also continue home benadryl, dronabinol.  Insomnia - ok  to use home zolpidem  Hypothyroid - tsh low, t4 wnl last hospitalization -Continue Synthroid  Gout - home colchicine, asymptomatic  HTN  -Blood pressure normal with metoprolol and Lasix.  Also continue aspirin 325 mg daily, rosuvastatin.  Migraine - on outpt monthly galcanezumab, recently received - cont home topamax  GERD -PPI  Mobility: Encourage ambulation. Code Status:   Code Status: DNR.  Per H&P, this was confirmed with patient and husband. Nutritional status: Body mass index is 32.95 kg/m.     Diet Order            Diet regular Room service appropriate? Yes; Fluid consistency: Thin  Diet effective now                 DVT prophylaxis: enoxaparin (LOVENOX) injection 40 mg Start: 04/01/20 2230   Antimicrobials:  None Fluid: Normal saline KVO  Consultants: Oncology Family Communication:  None at bedside  Status is: Inpatient  Remains inpatient appropriate because:Ongoing diagnostic testing needed not appropriate for outpatient work up and IV treatments appropriate due to intensity of illness or inability to take PO   Dispo: The patient is from: Home              Anticipated d/c is to: Home              Anticipated d/c date is: 3 days              Patient currently is not medically stable to d/c.   Infusions:  . sodium chloride Stopped (04/03/20 1315)  . piperacillin-tazobactam (ZOSYN)  IV 12.5 mL/hr at 04/03/20 1500  . vancomycin Stopped (04/03/20 1417)    Scheduled Meds: . aspirin  325 mg Oral QHS  . budesonide (PULMICORT) nebulizer solution  0.25 mg Nebulization BID  . Chlorhexidine Gluconate Cloth  6 each Topical Daily  . clonazePAM  1 mg Oral BID  . colchicine  0.6 mg Oral Q1500  . dexlansoprazole  60 mg Oral Daily  . enoxaparin (LOVENOX) injection  40 mg Subcutaneous QHS  . escitalopram  20 mg Oral QPM  . fluticasone furoate-vilanterol  1 puff Inhalation Daily  . furosemide  40 mg Intravenous BID  . guaiFENesin  1,200 mg Oral BID  .  hydrocortisone  10 mg Oral QPM  . hydrocortisone  20 mg Oral Q breakfast  . levothyroxine  112 mcg Oral Q0600  . metoprolol succinate  37.5 mg Oral Daily  . rosuvastatin  10 mg Oral Daily  . topiramate  150 mg Oral QHS  . zolpidem  10 mg Oral QHS    Antimicrobials: Anti-infectives (From admission, onward)   Start     Dose/Rate Route Frequency Ordered Stop   04/03/20 0100  vancomycin (VANCOCIN) IVPB 1000 mg/200 mL premix     Discontinue     1,000 mg 200 mL/hr over 60 Minutes Intravenous Every 12 hours 04/02/20 1203     04/02/20 1230  piperacillin-tazobactam (ZOSYN) IVPB 3.375 g     Discontinue     3.375 g 12.5 mL/hr over 240 Minutes Intravenous Every 8 hours 04/02/20 1203     04/02/20 1215  vancomycin (VANCOREADY) IVPB 1750 mg/350 mL        1,750 mg 175 mL/hr over 120 Minutes Intravenous  Once 04/02/20 1203 04/02/20 1433   04/02/20 1200  piperacillin-tazobactam (ZOSYN) IVPB 3.375 g  Status:  Discontinued        3.375 g 100 mL/hr over 30 Minutes Intravenous  Once 04/02/20 1145 04/02/20 1203   04/02/20 1200  vancomycin (VANCOCIN) IVPB 1000 mg/200 mL premix  Status:  Discontinued        1,000 mg 200 mL/hr over 60 Minutes Intravenous  Once 04/02/20 1145 04/02/20 1204      PRN meds: sodium chloride, albuterol, diphenhydrAMINE, dronabinol, HYDROmorphone (DILAUDID) injection, LORazepam, oxyCODONE-acetaminophen, Rimegepant Sulfate, sodium chloride flush, sucralfate   Objective: Vitals:   04/03/20 0958 04/03/20 1425  BP: 120/71 114/79  Pulse: 83 89  Resp: 15 14  Temp: 97.9 F (36.6 C) 98.2 F (36.8 C)  SpO2: 96% 100%    Intake/Output Summary (Last 24 hours) at 04/03/2020 1527 Last data filed at 04/03/2020 1500 Gross per 24 hour  Intake 1980.97 ml  Output 5100 ml  Net -3119.03 ml   Filed Weights   04/01/20 1456  Weight: 89.8 kg   Weight change:  Body mass index is 32.95 kg/m.   Physical Exam: General exam: Appears calm and comfortable.  Skin: No rashes, lesions or  ulcers. HEENT: Atraumatic, normocephalic, supple neck, no obvious bleeding Lungs: Has a scattered rhonchi after cough.  Otherwise clear to auscultation CVS: Regular rate and rhythm, no murmur GI/Abd soft, nontender, nondistended, bowel sound present CNS: Alert, awake, oriented x3  Psychiatry: Mood appropriate Extremities: No pedal edema, no calf tenderness, bilateral upper extremity swelling present more on the left than the right, left upper extremity continues to have patches of cellulitic changes.  Not worsening  Data Review: I have personally reviewed the laboratory data and studies available.  Recent Labs  Lab 04/01/20 1517  WBC 9.3  NEUTROABS 6.9  HGB 10.8*  HCT 33.9*  MCV 93.4  PLT 287   Recent Labs  Lab 04/01/20 1517 04/02/20 0408 04/03/20 0330  NA 140 136  --   K 3.3* 3.5  --   CL 100 98  --   CO2 25 27  --   GLUCOSE 109* 178*  --   BUN 19 20  --   CREATININE 1.06* 1.14* 1.12*  CALCIUM 8.3* 8.0*  --   MG 2.0  --   --     Signed, Terrilee Croak, MD [facility] Pager: [phone] (Secure Chat preferred). 04/03/2020

## 2020-04-04 ENCOUNTER — Encounter (HOSPITAL_COMMUNITY): Payer: Self-pay | Admitting: Obstetrics and Gynecology

## 2020-04-04 ENCOUNTER — Inpatient Hospital Stay (HOSPITAL_COMMUNITY): Payer: BC Managed Care – PPO

## 2020-04-04 DIAGNOSIS — J9611 Chronic respiratory failure with hypoxia: Secondary | ICD-10-CM

## 2020-04-04 DIAGNOSIS — E2740 Unspecified adrenocortical insufficiency: Secondary | ICD-10-CM

## 2020-04-04 DIAGNOSIS — R531 Weakness: Secondary | ICD-10-CM

## 2020-04-04 DIAGNOSIS — R110 Nausea: Secondary | ICD-10-CM

## 2020-04-04 LAB — BASIC METABOLIC PANEL
Anion gap: 9 (ref 5–15)
BUN: 23 mg/dL — ABNORMAL HIGH (ref 6–20)
CO2: 32 mmol/L (ref 22–32)
Calcium: 8.6 mg/dL — ABNORMAL LOW (ref 8.9–10.3)
Chloride: 95 mmol/L — ABNORMAL LOW (ref 98–111)
Creatinine, Ser: 1.15 mg/dL — ABNORMAL HIGH (ref 0.44–1.00)
GFR calc Af Amer: >60 mL/min (ref >60)
GFR calc non Af Amer: 56 mL/min — ABNORMAL LOW (ref >60)
Glucose, Bld: 130 mg/dL — ABNORMAL HIGH (ref 70–99)
Potassium: 3.2 mmol/L — ABNORMAL LOW (ref 3.5–5.1)
Sodium: 136 mmol/L (ref 135–145)

## 2020-04-04 LAB — CBC WITH DIFFERENTIAL/PLATELET
Abs Immature Granulocytes: 0.06 10*3/uL (ref 0.00–0.07)
Basophils Absolute: 0.1 10*3/uL (ref 0.0–0.1)
Basophils Relative: 1 %
Eosinophils Absolute: 0.3 10*3/uL (ref 0.0–0.5)
Eosinophils Relative: 3 %
HCT: 32.6 % — ABNORMAL LOW (ref 36.0–46.0)
Hemoglobin: 10.2 g/dL — ABNORMAL LOW (ref 12.0–15.0)
Immature Granulocytes: 1 %
Lymphocytes Relative: 30 %
Lymphs Abs: 2.8 10*3/uL (ref 0.7–4.0)
MCH: 29.5 pg (ref 26.0–34.0)
MCHC: 31.3 g/dL (ref 30.0–36.0)
MCV: 94.2 fL (ref 80.0–100.0)
Monocytes Absolute: 1 10*3/uL (ref 0.1–1.0)
Monocytes Relative: 10 %
Neutro Abs: 5.3 10*3/uL (ref 1.7–7.7)
Neutrophils Relative %: 55 %
Platelets: 278 10*3/uL (ref 150–400)
RBC: 3.46 MIL/uL — ABNORMAL LOW (ref 3.87–5.11)
RDW: 15.4 % (ref 11.5–15.5)
WBC: 9.4 10*3/uL (ref 4.0–10.5)
nRBC: 0 % (ref 0.0–0.2)

## 2020-04-04 LAB — PROCALCITONIN: Procalcitonin: <0.1 ng/mL

## 2020-04-04 MED ORDER — BECLOMETHASONE DIPROP HFA 80 MCG/ACT IN AERB
1.0000 | INHALATION_SPRAY | Freq: Two times a day (BID) | RESPIRATORY_TRACT | Status: DC
  Administered 2020-04-06 – 2020-04-09 (×2): 1 via RESPIRATORY_TRACT

## 2020-04-04 MED ORDER — FUROSEMIDE 10 MG/ML IJ SOLN
40.0000 mg | Freq: Three times a day (TID) | INTRAMUSCULAR | Status: DC
  Administered 2020-04-04 – 2020-04-05 (×4): 40 mg via INTRAVENOUS
  Filled 2020-04-04 (×4): qty 4

## 2020-04-04 MED ORDER — FLUCONAZOLE 150 MG PO TABS
150.0000 mg | ORAL_TABLET | Freq: Once | ORAL | Status: AC
  Administered 2020-04-04: 150 mg via ORAL
  Filled 2020-04-04 (×3): qty 1

## 2020-04-04 MED ORDER — BENZONATATE 100 MG PO CAPS
200.0000 mg | ORAL_CAPSULE | Freq: Three times a day (TID) | ORAL | Status: DC | PRN
  Administered 2020-04-05 – 2020-04-09 (×12): 200 mg via ORAL
  Filled 2020-04-04 (×12): qty 2

## 2020-04-04 MED ORDER — POTASSIUM CHLORIDE CRYS ER 20 MEQ PO TBCR
40.0000 meq | EXTENDED_RELEASE_TABLET | Freq: Once | ORAL | Status: AC
  Administered 2020-04-04: 40 meq via ORAL
  Filled 2020-04-04: qty 1
  Filled 2020-04-04: qty 2

## 2020-04-04 NOTE — Consult Note (Addendum)
NAME:  [name], MRN:  [id number], DOB:  [birth year], LOS: 1 ADMISSION DATE:  04/01/2020, CONSULTATION DATE:  7/5 REFERRING MD:  Dahal, CHIEF COMPLAINT:  Hemoptysis    Brief History   49 year old female w/ complicated oncological history and cough variant asthma. She is on [place] as out pt for uncontrolled asthma (felt allergic related as eosinophils on bronch 9%). Admitted w/ working dx of recurrent cellulitis/vasculitis w/ one BC growing from port w/ GNR. PCCM asked to eval for new onset of hemoptysis since admission.   History of present illness   This is a 49 year old female w/ an extensive oncological history as noted below. 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 Presented 7/2 w/ cc: left upper extremity swelling and redness. Fever as high as 102, no cough or shortness of breath. [place] of upper extremity was neg for DVT. Admitted w/ working dx of cellulitis/vascultis. Since admit she has had one BC drawn from porta cath that was growing GNR. On 7/4 began to report new episode of fairly violent cough w/ some associated hemoptysis. CXR was clear. Had three more episodes of hemoptysis overnight. Because of the hemoptysis PCCM asked to evaluate    Past Medical History  Breast cancer (bilateral mastectomy), Hodgkins lymphoma, thyroid cancer s/p resection, cervical cancer (all of these in "remission"). Also has h/o chest lymphoma. 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  Chronic chemotherapy induced cor pulmonale  Cough variant Asthma (followed by [facility]) Chronic respiratory failure in 3-4 liters;  no significant history of pulmonary fibrosis Last had bronchoscopy by Dr. Ardeen Jourdain 2020 she notes entry of right middle lobe noted be narrowed and very friable but no active bleeding there were thick white secretions present throughout the airway.  She sent cell count, AFB, fungal cytology and  RVP from bronchial alveolar lavage these were all negative needle biopsies were sent from the [facility].  There was no active bleeding noted.  She did have 9% eosinophilia noted.  Because of this her chronic respiratory failure was felt possibly due to uncontrolled allergic type asthma Vasculitis   Significant Hospital Events   7/2 admitted with working diagnosis of cellulitis 7/3: Epistaxis  7/4 significant cough, having episodes of hemoptysis.  Consults:  Pulmonary consulted  Procedures:    Significant Diagnostic Tests:  CT chest 7/5  Micro Data:  Blood cultures x1 from Port-A-Cath 7/2 gram-negative rods>>> Antimicrobials:   Vancomycin 7/3>>Discontinued Zosyn 7/3 Interim history/subjective:  Arm pain.  Still having significant cough has coughed 3 times since last evening with old bloody clot approximately a dime size  Objective   Blood pressure 112/78, pulse 85, temperature 98.2 F (36.8 C), temperature source Oral, resp. rate 18, height 5' 5" (1.651 m), weight 89.8 kg, SpO2 100 %.        Intake/Output Summary (Last 24 hours) at 04/04/2020 1017 Last data filed at 04/04/2020 0850 Gross per 24 hour  Intake 1320.09 ml  Output 5500 ml  Net -4179.91 ml   Filed Weights   04/01/20 1456  Weight: 89.8 kg    Examination: General: 49 year old white female resting in bed no acute distress HENT: Marked hoarse phonation, wet audible cough.  No significant upper airway wheezing.  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  Unfortunately she is also been readmitted with a working diagnosis of recurrent vasculitis in the setting of her immunocompromise state.  She is also now exhibiting new hemoptysis, which she has had in the past.  Her last bronchoscopy was notable for friable tissue near the right middle lobe, but there was no visible bleeding.  Eosinophils were 9%.  Of note she has had several episodes of epistaxis since admission which could certainly explain coughing up blood at this point, I wonder if she aspirated this.  She has a significant wet upper airway sounding cough, she reports a significant history of reflux, given her complicated surgical history aspiration or predisposition to aspiration at night certainly a consideration as well.  At this point her hemoptysis is fairly tolerable so we will proceed with evaluation as follows  Plan Humidify oxygen Noncontrasted CT of chest to evaluate pulmonary parenchyma.   Cough suppression Treat reflux Discontinue Lovenox, Place PAS hose Consider ECHO abx per primary   Additional recommendations per Dr. Thera Flake practice:  Per primary, note holding Lovenox as of 7/5  Labs   CBC: Recent Labs  Lab 04/01/20 1517 04/04/20 0314  WBC 9.3 9.4  NEUTROABS 6.9 5.3  HGB 10.8* 10.2*  HCT 33.9* 32.6*  MCV 93.4 94.2  PLT 287 546    Basic Metabolic Panel: Recent Labs  Lab 04/01/20 1517 04/02/20 0408 04/03/20 0330  04/04/20 0314  NA 140 136  --  136  K 3.3* 3.5  --  3.2*  CL 100 98  --  95*  CO2 25 27  --  32  GLUCOSE 109* 178*  --  130*  BUN 19 20  --  23*  CREATININE 1.06* 1.14* 1.12* 1.15*  CALCIUM 8.3* 8.0*  --  8.6*  MG 2.0  --   --   --    GFR: Estimated Creatinine Clearance: 65.5 mL/min (A) (by C-G formula based on SCr of 1.15 mg/dL (H)). Recent Labs  Lab 04/01/20 1517 04/04/20 0314  WBC 9.3 9.4    Liver Function Tests: Recent Labs  Lab 04/01/20 1517  AST 29  ALT 38  ALKPHOS 56  BILITOT 0.5  PROT 7.3  ALBUMIN 3.9   No results for input(s): LIPASE, AMYLASE in the last 168 hours. No results for input(s): AMMONIA in the last 168 hours.  ABG    Component Value Date/Time   PHART 7.296 (L) 04/02/2018 1343   PCO2ART 40.8 04/02/2018 1343   PO2ART 140.0 (H) 04/02/2018 1343   HCO3 19.9 (L) 04/02/2018 1343   HCO3 20.8 04/02/2018 1343   TCO2 21 (L) 04/02/2018 1343   TCO2 22 04/02/2018 1343   ACIDBASEDEF 6.0 (H) 04/02/2018 1343   ACIDBASEDEF 6.0 (H) 04/02/2018 1343   O2SAT 99.0 04/02/2018 1343   O2SAT 65.0 04/02/2018 1343     Coagulation Profile: No results for input(s): INR,  PROTIME in the last 168 hours.  Cardiac Enzymes: No results for input(s): CKTOTAL, CKMB, CKMBINDEX, TROPONINI in the last 168 hours.  HbA1C: Hgb A1c MFr Bld  Date/Time Value Ref Range Status  03/18/2010 05:40 AM  <5.7 % Final   5.4 (NOTE)                                                                       According to the ADA Clinical Practice Recommendations for 2011, when HbA1c is used as a screening test:   >=6.5%   Diagnostic of Diabetes Mellitus           (if abnormal result  is confirmed)  5.7-6.4%   Increased risk of developing Diabetes Mellitus  References:Diagnosis and Classification of Diabetes Mellitus,Diabetes GPQD,8264,15(AXENM 1):S62-S69 and Standards of Medical Care in         Diabetes - 2011,Diabetes MHWK,0881,10  (Suppl 1):S11-S61.    CBG: No results for input(s): GLUCAP in  the last 168 hours.  Review of Systems:   Review of Systems  Constitutional: Positive for fever and malaise/fatigue.  HENT: Negative.   Eyes: Negative.   Respiratory: Positive for cough, hemoptysis, sputum production and shortness of breath.   Cardiovascular: Positive for leg swelling.  Gastrointestinal: Positive for heartburn.  Genitourinary: Negative.   Musculoskeletal: Positive for joint pain.  Skin: Positive for rash.  Neurological: Negative.   Endo/Heme/Allergies: Negative.   Psychiatric/Behavioral: Negative.      Past Medical History  She,  has a past medical history of Addison's disease ([place]), Adrenal insufficiency ([place]), Anemia, Anxiety, Aortic stenosis, Aortic stenosis, Appendicitis (12/19/09), Appendicitis, Breast cancer ([place]), Cellulitis of right middle finger (11/07/2018), Cervical cancer ([place]) (12/23/2018), Chest pain, CHF with right heart failure ([place]) (04/17/2017), Chronic respiratory failure with hypoxia ([place]) (12/23/2018), Cough variant asthma (04/13/2019), Depression, GERD (gastroesophageal reflux disease), Headache, Heart murmur, History of kidney stones, Hodgkin lymphoma ([place]), Hodgkin's lymphoma ([place]), Hypertension, Hypoxia, Necrotizing fasciitis ([place]) (12/23/2018), Non-ischemic cardiomyopathy ([place]), Osteoporosis, Palpitations, Pituitary adenoma ([place]) (12/23/2018), Pneumonia, PONV (postoperative nausea and vomiting), Pre-diabetes, Pulmonary hypertension ([place]) (12/23/2018), Raynaud phenomenon, Right heart failure ([place]) (04/17/2017), Supplemental oxygen dependent, SVT (supraventricular tachycardia) ([place]), Tachycardia, and Thyroid cancer ([place]).   Surgical History    Past Surgical History:  Procedure Laterality Date   ABDOMINAL HYSTERECTOMY     AMPUTATION Left 01/30/2019   Procedure: Left Index finger amputation with flap reconstruction and repair reconstruction;  Surgeon: Roseanne Kaufman, MD;  Location: [facility];  Service: Orthopedics;  Laterality: Left;   APPENDECTOMY     CARDIAC  CATHETERIZATION  05/18/09   NORMAL CATH   I & D EXTREMITY Left 12/23/2018   Procedure: IRRIGATION AND DEBRIDEMENT HAND / INDEX FINGER;  Surgeon: Roseanne Kaufman, MD;  Location: [facility];  Service: Orthopedics;  Laterality: Left;   MASTECTOMY     PITUITARY SURGERY     RIGHT/LEFT HEART CATH AND CORONARY ANGIOGRAPHY N/A 04/02/2018   Procedure: RIGHT/LEFT HEART CATH AND CORONARY ANGIOGRAPHY;  Surgeon: Burnell Blanks, MD;  Location: [facility];  Service: Cardiovascular;  Laterality: N/A;   TOTAL THYROIDECTOMY     VIDEO BRONCHOSCOPY Bilateral 11/14/2018   Procedure: VIDEO BRONCHOSCOPY WITHOUT FLUORO;  Surgeon: Margaretha Seeds, MD;  Location: [facility];  Service: Cardiopulmonary;  Laterality: Bilateral;  VIDEO BRONCHOSCOPY WITH ENDOBRONCHIAL ULTRASOUND N/A 11/19/2018   Procedure: VIDEO BRONCHOSCOPY WITH ENDOBRONCHIAL ULTRASOUND;  Surgeon: Margaretha Seeds, MD;  Location: [facility];  Service: Thoracic;  Laterality: N/A;     Social History   reports that she has never smoked. She has never used smokeless tobacco. She reports current alcohol use. She reports that she does not use drugs.   Family History   Her Family history is unknown by patient.   Allergies Allergies  Allergen Reactions   Mushroom Extract Complex Anaphylaxis   Na Ferric Gluc Cplx In Sucrose Anaphylaxis   Cymbalta [Duloxetine Hcl] Swelling and Anxiety   Succinylcholine Other (See Comments)    Lock Jaw   Buprenorphine Hcl Hives   Compazine Other (See Comments)    Altered mental status Aggression   Metoclopramide Other (See Comments)    Dystonia   Morphine And Related Hives   Ondansetron Hives and Rash   Promethazine Hcl Hives   Tegaderm Ag Mesh [Silver] Rash    Old formulation only, is able to tolerate new formulation     Home Medications  Prior to Admission medications   Medication Sig Start Date End Date Taking? Authorizing Provider  albuterol (2.5 MG/3ML) 0.083% NEBU 3 mL, albuterol  (5 MG/ML) 0.5% NEBU 0.5 mL Inhale 3 mg into the lungs every 6 (six) hours as needed (SOB).    Yes [provider]  albuterol (VENTOLIN HFA) 108 (90 Base) MCG/ACT inhaler Inhale 2 puffs into the lungs every 6 (six) hours as needed for wheezing or shortness of breath.   Yes [provider]  Alum & Mag Hydroxide-Simeth (GI COCKTAIL) SUSP suspension Take 30 mLs daily as needed by mouth for indigestion (BLEEDING ULCERS). Shake well.   Yes [provider]  aspirin 325 MG tablet Take 325 mg by mouth at bedtime.    Yes [provider]  beclomethasone (QVAR) 80 MCG/ACT inhaler Inhale 1 puff into the lungs 2 (two) times daily.    Yes [provider]  benzonatate (TESSALON) 200 MG capsule Take 1 capsule (200 mg total) by mouth 3 (three) times daily as needed for cough. 04/15/19  Yes Martyn Ehrich, NP  clonazePAM (KLONOPIN) 1 MG tablet Take 1 mg by mouth 2 (two) times daily.    Yes [provider]  colchicine 0.6 MG tablet Take 0.6 mg by mouth daily in the afternoon.  12/31/19  Yes [provider]  dexlansoprazole (DEXILANT) 60 MG capsule Take 60 mg by mouth daily.     Yes [provider]  diphenhydrAMINE (BENADRYL) 50 MG/ML injection Inject 1 mL (50 mg total) into the vein every 6 (six) hours as needed (nausea). 12/25/18  Yes Rai, Ripudeep K, MD  dronabinol (MARINOL) 2.5 MG capsule Take 2.5 mg by mouth 2 (two) times daily as needed (nausea).   Yes [provider]  escitalopram (LEXAPRO) 20 MG tablet Take 20 mg by mouth every evening.  04/24/16  Yes [provider]  estradiol (ESTRACE) 2 MG tablet Take 2 mg by mouth See admin instructions. Takes 1 tablet by mouth every 3 days. 02/01/16  Yes [provider]  fluticasone furoate-vilanterol (BREO ELLIPTA) 200-25 MCG/INH AEPB Inhale 1 puff into the lungs daily.   Yes [provider]  furosemide (LASIX) 40 MG tablet Take 1 tablet (40 mg total) by mouth  daily. Patient taking differently: Take 40 mg by mouth in the morning and at bedtime.  02/17/20  Yes Kyle, Tyrone A, DO  Galcanezumab-gnlm 120 MG/ML  SOAJ Inject 120 mg into the skin every 28 (twenty-eight) days.  03/31/19  Yes [provider]  Heparin Lock Flush (HEPARIN NICU/SCN FLUSH) 1 UNIT/ML SOLN Heparin Lock Flush Solution - 500 USP UNITS/5 mL  #30 pack with 1 refill Patient taking differently: Inject 0.5-1.7 mLs into the vein See admin instructions. Before accessing port 02/01/20  Yes Nicholas Lose, MD  levothyroxine (SYNTHROID, LEVOTHROID) 112 MCG tablet Take 112 mcg by mouth daily before breakfast.   Yes [provider]  LORazepam (ATIVAN) 0.5 MG tablet Take 1-2 tablets (0.5-1 mg total) by mouth 2 (two) times daily as needed (nausea). Patient taking differently: Take 0.5 mg by mouth 2 (two) times daily as needed for anxiety or sleep (nausea).  02/22/20  Yes Nicholas Lose, MD  Mepolizumab (NUCALA) 100 MG SOLR Inject 100 mg into the skin every 28 (twenty-eight) days. 05/13/19  Yes Margaretha Seeds, MD  metoprolol succinate (TOPROL-XL) 25 MG 24 hr tablet Take 37.5 mg by mouth daily.    Yes [provider]  oxyCODONE-acetaminophen (PERCOCET/ROXICET) 5-325 MG tablet Take 1 tablet by mouth every 8 (eight) hours as needed for severe pain. 02/01/20  Yes Nicholas Lose, MD  OXYGEN Inhale 4 L into the lungs continuous.    Yes [provider]  Rimegepant Sulfate (NURTEC) 75 MG TBDP Take 75 mg by mouth daily as needed (Migraine).    Yes [provider]  rosuvastatin (CRESTOR) 10 MG tablet Take 10 mg by mouth daily.  02/28/18  Yes [provider]  silver sulfADIAZINE (SILVADENE) 1 % cream Apply 1 application topically daily as needed (Open Wound).  11/16/19  Yes [provider]  sodium chloride flush 0.9 % SOLN injection 10 mL syringes - #30 count with 1 refill 02/01/20  Yes Nicholas Lose, MD  sucralfate (CARAFATE) 1 g tablet Take 1 g by mouth daily as  needed (FOR ULCERS).    Yes [provider]  topiramate (TOPAMAX) 50 MG tablet Take 150 mg by mouth at bedtime.  12/25/19  Yes [provider]  zolpidem (AMBIEN CR) 12.5 MG CR tablet Take 12.5 mg by mouth at bedtime.     Yes [provider]  budesonide (PULMICORT) 0.25 MG/2ML nebulizer solution Take 2 mLs (0.25 mg total) by nebulization 2 (two) times daily for 5 days. Patient not taking: Reported on 04/01/2020 02/14/20 02/19/20  Cherylann Ratel A, DO  EPINEPHRINE 0.3 mg/0.3 mL IJ SOAJ injection INJECT 0.3 MLS (0.3 MG TOTAL) INTO THE MUSCLE AS NEEDED FOR ANAPHYLAXIS. 12/09/19   Margaretha Seeds, MD  furosemide (LASIX) 40 MG tablet Take 1 tablet (40 mg total) by mouth 2 (two) times daily for 5 doses. Patient not taking: Reported on 04/01/2020 02/14/20 02/17/20  Cherylann Ratel A, DO  hydrocortisone (CORTEF) 10 MG tablet Take 1-2 tablets (10-20 mg total) by mouth See admin instructions. Take 20 mg in the am and 27m in the evening Patient taking differently: Take 10-20 mg by mouth See admin instructions. Take 20 mg by mouth in the morning and 151min the evening 02/17/20   KyCherylann Ratel, DO  sulfamethoxazole-trimethoprim (BACTRIM DS) 800-160 MG tablet Take 1 tablet by mouth 2 (two) times daily. 03/08/20   [provider]     Critical care time: na    PeErick ColaceCNP-BC [place] # [phone] # [phone] no answer

## 2020-04-04 NOTE — Progress Notes (Signed)
PHARMACY - PHYSICIAN COMMUNICATION CRITICAL VALUE ALERT - BLOOD CULTURE IDENTIFICATION (BCID)  [name] is an 49 y.o. female who presented to [facility] on 04/01/2020 with a chief complaint of Arm Swelling  Assessment:  Patient with arm swelling and multiple site cancers.  Vancomycin, zosyn started.  Gram stain showed GNR but BCID is final without positive. (include suspected source if known)  Name of physician (or Provider) Contacted: X. Blount  Current antibiotics: Vancomycin and Zosyn   Changes to prescribed antibiotics recommended:  Patient is on recommended antibiotics - No changes needed  Results for orders placed or performed during the hospital encounter of 04/01/20  Blood Culture ID Panel (Reflexed) (Collected: 04/01/2020  3:49 PM)  Result Value Ref Range   Enterococcus species NOT DETECTED NOT DETECTED   Listeria monocytogenes NOT DETECTED NOT DETECTED   Staphylococcus species NOT DETECTED NOT DETECTED   Staphylococcus aureus (BCID) NOT DETECTED NOT DETECTED   Streptococcus species NOT DETECTED NOT DETECTED   Streptococcus agalactiae NOT DETECTED NOT DETECTED   Streptococcus pneumoniae NOT DETECTED NOT DETECTED   Streptococcus pyogenes NOT DETECTED NOT DETECTED   Acinetobacter baumannii NOT DETECTED NOT DETECTED   Enterobacteriaceae species NOT DETECTED NOT DETECTED   Enterobacter cloacae complex NOT DETECTED NOT DETECTED   Escherichia coli NOT DETECTED NOT DETECTED   Klebsiella oxytoca NOT DETECTED NOT DETECTED   Klebsiella pneumoniae NOT DETECTED NOT DETECTED   Proteus species NOT DETECTED NOT DETECTED   Serratia marcescens NOT DETECTED NOT DETECTED   Haemophilus influenzae NOT DETECTED NOT DETECTED   Neisseria meningitidis NOT DETECTED NOT DETECTED   Pseudomonas aeruginosa NOT DETECTED NOT DETECTED   Candida albicans NOT DETECTED NOT DETECTED   Candida glabrata NOT DETECTED NOT DETECTED   Candida krusei NOT DETECTED NOT DETECTED   Candida parapsilosis NOT  DETECTED NOT DETECTED   Candida tropicalis NOT DETECTED NOT DETECTED    [name] 04/04/2020  6:12 AM

## 2020-04-04 NOTE — Plan of Care (Signed)
  Problem: Education: Goal: Knowledge of General Education information will improve Description: Including pain rating scale, medication(s)/side effects and non-pharmacologic comfort measures Outcome: Progressing   Problem: Health Behavior/Discharge Planning: Goal: Ability to manage health-related needs will improve Outcome: Progressing

## 2020-04-04 NOTE — Progress Notes (Signed)
PROGRESS NOTE  [name]  DOB: 04-Jun-1971  PCP: Su Grand, MD FYB:[id number]  DOA: 04/01/2020  LOS: 1 day   Chief Complaint  Patient presents with  . Arm Swelling   Brief narrative: [name] is a 49 y.o. female with PMH significant for multiple site cancers (breast cancer, hodgkins lymphoma, thyroid cancer s/p resection, cervical cancer - all in remission), chest lymphoma, vasculitis, post-surgical hypothyroidism, pituitary tumor s/p resection, adrenal insufficiency on steroids, nephrolithiasis, venous thrombosis, migraines, asthma, right heart failure, chronic O2 (3-4 L [place]), HTN, and recurrent ulcers and swelling of left arm thought to be possible vasculitis. Patient presented to the ED on 7/2 for worsening left arm swelling and redness.  Patient had similar presentation and admission last month.  She was treated with IV Lasix and antibiotics, she was discharged on a course of cefdinir and continued on oral Lasix.   She recently noticed recurrence of redness and swelling and was started on a course of Bactrim as an outpatient.  Redness and swelling continued to worsen, she also had a fever of 102 at home.  She was seen at 'symptom management clinic' at her oncologist office.  She was sent to the ED for IV antibiotics  As an outpatient she is followed by dermatology and rheumatology at [facility], [facility], [facility], [facility], and neurology and gastroenterology at [facility].  In the ED, she was afebrile, heart rate in 80s and 90s, blood pressure normal range, oxygen saturation more than 95% on 3 L by nasal cannula. Labs unremarkable Ultrasound upper extremities negative for DVT. Chest x-ray normal. Patient was admitted to hospitalist service for further evaluation and management  Subjective: Patient was seen and examined this morning.  Patient was returning back from the bathroom to her bed. Off oxygen supplementation while using bathroom and  during 10 minutes of my encounter with her. Patient states that her extremities feel more puffy today.  She says the cellulitis on the left forearm seems to be extending to the arm now. Had 3 more episodes of hemoptysis overnight.  Chart reviewed. No fever noted since admission. Labs from this morning with potassium low at 3.2, creatinine stable at 1.15. Net diuresis of 1.3 L in last 24 hours and 4.4 L since admission. Blood culture sent on admission showed gram-negative rod on Gram stain but did not show any growth on the final culture.  Assessment/Plan: Recurrent cellulitis Immunocompromise status -presented with worsening left arm swelling and fever of 102 at home. -Immunocompromised status and history of recurrent left upper extremity cellulitis.   -Reports improvement in swelling last month with IV Lasix and also a course of Bactrim.   -No fever documented in the hospital. WBC count normal.   -Ultrasound with no evidence of upper extremity DVT.  -Initiated on progesterone antibiotic coverage with IV Zosyn and IV vancomycin. -Personally I do not think her cellulitis is getting worse but patient thinks it is extending to her left arm today. -Continue to monitor clinically.  Gram-negative rod in blood culture -Blood culture sent on admission showed gram-negative rod on Gram stain but did not show any growth on the final culture. -Patient probably has gram-negative bacteremia which is not showing up in the final culture.  Discussed with pharmacy.  Can stop vancomycin.  Can switch IV Zosyn to IV Rocephin IV cefepime. -Patient asked for 1 dose of Diflucan today for history of recurrent vaginal candidiasis with antibiotics.  Hemoptysis -3 episodes of hemoptysis in last 24 hours.   -  Patient reports history of hemoptysis in the past and underwent bronchoscopy by pulmonary Dr. Ebony Hail in February 2020.  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 -Chest x-ray this admission did not show any evidence of pneumonia. -On examination, she has bouts of cough on deep breathing with subsequent rhonchi. -Obtain procalcitonin level. -Pulmonary consultation called to Dr. Lake Bells.  Chronic respiratory failure with hypoxia -On 3-4 L of oxygen at home. -I have noted during my encounter last few days that patient can stay off oxygen supplementation for several minutes.  She gets episodes of coughing spells probably worsened by anxiety during which her oxygen saturation drops and she needs supplemental oxygen. -Cont home qvar, breo, Tessalon Perles -on outpt monthly nucala injection, recently received  Volume overload status History of chronic CHF with reduced EF -Patient definitely has puffy extremities.  She reports improvement with IV Lasix in last admission. At home, patient was on Lasix 40 mg twice daily.  -Patient is on chronic hydrocortisone which could be responsible for fluid retention. -Albumin level normal at 3.9. -Echocardiogram from May 2021 with EF 65 to 70%, normal LV function, normal RV function, moderate tricuspid regurgitation, pulmonary artery systolic pressure mildly elevated to 35. -She is currently Lasix IV 40 mg twice daily. -Net diuresis of 1.3 L in last 24 hours and 4.4 L since admission. -Creatinine remains stable under less than 1.2. -At patient's request, I increased IV Lasix 40 mg frequency from twice a day to 3 times a day. -Continue to monitor.  Hypokalemia -Potassium low at 3.2 today.  Replacement ordered.  Adrenal insufficiency  -secondary to pituitary resection for adenoma (2002, status post gamma knife surgery for regrowth of tumor in 2015) -Takes hydrocortisone 20 mg in the morning and 10 mg in the evening. -Same resumed.  Chronic pain/chronic nausea -Patient has a port and uses IV Benadryl and oral Dilaudid at home. -Currently on IV Benadryl and  IV Dilaudid. -I discussed the patient about the need to switch from IV to oral Dilaudid.  Plan to do so next 24 hours.  Chronic pain, anxiety, depression -I think her anxiety is partly responsible for her perceived severity of her disease and symptoms. -On oxycodone, Lexapro and clonazepam at home.   -Same have been resumed. -Also continue home benadryl, dronabinol.  Insomnia -ok to use home zolpidem  Hypothyroid -tsh low, t4 wnl last hospitalization -Continue Synthroid  Gout -home colchicine, asymptomatic  HTN  -Blood pressure normal with metoprolol and Lasix.   -Also continue aspirin 325 mg daily, rosuvastatin.  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 Code Status:   Code Status: DNR.  Per H&P, this was confirmed with patient and husband. Nutritional status: Body mass index is 32.95 kg/m.     Diet Order            Diet regular Room service appropriate? Yes; Fluid consistency: Thin  Diet effective now                 DVT prophylaxis: enoxaparin (LOVENOX) injection 40 mg Start: 04/01/20 2230   Antimicrobials:  None Fluid: None  Consultants: Oncology Family Communication:  None at bedside  Status is: Inpatient  Remains inpatient appropriate because:Ongoing diagnostic testing needed not appropriate for outpatient work up and IV treatments appropriate due to intensity of illness or inability to take PO   Dispo: The patient is from: Home              Anticipated d/c is to: Home              Anticipated d/c date is: 3 days              Patient currently is not medically stable to d/c.   Infusions:  . piperacillin-tazobactam (ZOSYN)  IV 3.375 g (04/04/20 0448)      Scheduled Meds: . aspirin  325 mg Oral QHS  . budesonide (PULMICORT) nebulizer solution  0.25 mg Nebulization BID  . Chlorhexidine Gluconate Cloth  6 each Topical Daily  . clonazePAM  1 mg Oral BID  . colchicine  0.6 mg Oral Q1500  . dexlansoprazole  60 mg Oral Daily  . enoxaparin (LOVENOX) injection  40 mg Subcutaneous QHS  . escitalopram  20 mg Oral QPM  . fluconazole  150 mg Oral Once  . fluticasone furoate-vilanterol  1 puff Inhalation Daily  . furosemide  40 mg Intravenous TID AC  . guaiFENesin  1,200 mg Oral BID  . hydrocortisone  10 mg Oral QPM  . hydrocortisone  20 mg Oral Q breakfast  . levothyroxine  112 mcg Oral Q0600  . metoprolol succinate  37.5 mg Oral Daily  . rosuvastatin  10 mg Oral Daily  . topiramate  150 mg Oral QHS  . zolpidem  10 mg Oral QHS    Antimicrobials: Anti-infectives (From admission, onward)   Start     Dose/Rate Route Frequency Ordered Stop   04/04/20 1100  fluconazole (DIFLUCAN) tablet 150 mg     Discontinue     150 mg Oral  Once 04/04/20 1024     04/03/20 0100  vancomycin (VANCOCIN) IVPB 1000 mg/200 mL premix  Status:  Discontinued        1,000 mg 200 mL/hr over 60 Minutes Intravenous Every 12 hours 04/02/20 1203 04/04/20 1019   04/02/20 1230  piperacillin-tazobactam (ZOSYN) IVPB 3.375 g     Discontinue     3.375 g 12.5 mL/hr over 240 Minutes Intravenous Every 8 hours 04/02/20 1203     04/02/20 1215  vancomycin (VANCOREADY) IVPB 1750 mg/350 mL        1,750 mg 175 mL/hr over 120 Minutes Intravenous  Once 04/02/20 1203 04/02/20 1433   04/02/20 1200  piperacillin-tazobactam (ZOSYN) IVPB 3.375 g  Status:  Discontinued        3.375 g 100 mL/hr over 30 Minutes Intravenous  Once 04/02/20 1145 04/02/20 1203   04/02/20 1200  vancomycin (VANCOCIN) IVPB 1000 mg/200 mL premix  Status:  Discontinued        1,000 mg 200 mL/hr over 60 Minutes Intravenous  Once 04/02/20 1145 04/02/20 1204      PRN meds: albuterol, diphenhydrAMINE, dronabinol,  HYDROmorphone (DILAUDID) injection, LORazepam, oxyCODONE-acetaminophen, Rimegepant Sulfate, sodium chloride flush, sucralfate   Objective: Vitals:   04/04/20 0618 04/04/20 0838  BP: 130/85 112/78  Pulse: 81 85  Resp: 18   Temp:    SpO2: 100%     Intake/Output Summary (Last 24 hours) at 04/04/2020 1042 Last data filed at 04/04/2020 0850 Gross per 24 hour  Intake 1320.09 ml  Output 4950 ml  Net -3629.91 ml   Filed Weights   04/01/20 1456  Weight: 89.8 kg   Weight change:  Body mass index is 32.95 kg/m.   Physical Exam: General exam: Not in physical distress. Skin: No rashes, lesions or ulcers. HEENT: Atraumatic, normocephalic, supple neck, no obvious bleeding Lungs: Has a scattered rhonchi after cough. Otherwise clear to auscultation CVS: Regular rate and rhythm, no murmur GI/Abd soft, nontender, nondistended, bowel sound present CNS: Alert, awake, oriented x3  Psychiatry: Mood appropriate.  Extremities: No pedal edema, no calf tenderness, bilateral upper extremity swelling present more on the left than the right, left upper extremity continues to have patches of cellulitic changes.  I do not think cellulitis is worsening.  Data Review: I have personally reviewed the laboratory data and studies available.  Recent Labs  Lab 04/01/20 1517 04/04/20 0314  WBC 9.3 9.4  NEUTROABS 6.9 5.3  HGB 10.8* 10.2*  HCT 33.9* 32.6*  MCV 93.4 94.2  PLT 287 278   Recent Labs  Lab 04/01/20 1517 04/02/20 0408 04/03/20 0330 04/04/20 0314  NA 140 136  --  136  K 3.3* 3.5  --  3.2*  CL 100 98  --  95*  CO2 25 27  --  32  GLUCOSE 109* 178*  --  130*  BUN 19 20  --  23*  CREATININE 1.06* 1.14* 1.12* 1.15*  CALCIUM 8.3* 8.0*  --  8.6*  MG 2.0  --   --   --     Signed, Terrilee Croak, MD [facility] Pager: [phone] (Secure Chat preferred). 04/04/2020

## 2020-04-05 LAB — CBC WITH DIFFERENTIAL/PLATELET
Abs Immature Granulocytes: 0.04 10*3/uL (ref 0.00–0.07)
Basophils Absolute: 0.1 10*3/uL (ref 0.0–0.1)
Basophils Relative: 1 %
Eosinophils Absolute: 0.2 10*3/uL (ref 0.0–0.5)
Eosinophils Relative: 2 %
HCT: 32.6 % — ABNORMAL LOW (ref 36.0–46.0)
Hemoglobin: 10.4 g/dL — ABNORMAL LOW (ref 12.0–15.0)
Immature Granulocytes: 0 %
Lymphocytes Relative: 26 %
Lymphs Abs: 2.6 10*3/uL (ref 0.7–4.0)
MCH: 30 pg (ref 26.0–34.0)
MCHC: 31.9 g/dL (ref 30.0–36.0)
MCV: 93.9 fL (ref 80.0–100.0)
Monocytes Absolute: 1 10*3/uL (ref 0.1–1.0)
Monocytes Relative: 10 %
Neutro Abs: 6.1 10*3/uL (ref 1.7–7.7)
Neutrophils Relative %: 61 %
Platelets: 277 10*3/uL (ref 150–400)
RBC: 3.47 MIL/uL — ABNORMAL LOW (ref 3.87–5.11)
RDW: 15.3 % (ref 11.5–15.5)
WBC: 10.1 10*3/uL (ref 4.0–10.5)
nRBC: 0 % (ref 0.0–0.2)

## 2020-04-05 LAB — HEMOGLOBIN A1C
Hgb A1c MFr Bld: 6.2 % — ABNORMAL HIGH (ref 4.8–5.6)
Mean Plasma Glucose: 131.24 mg/dL

## 2020-04-05 LAB — BASIC METABOLIC PANEL
Anion gap: 13 (ref 5–15)
BUN: 26 mg/dL — ABNORMAL HIGH (ref 6–20)
CO2: 31 mmol/L (ref 22–32)
Calcium: 8.9 mg/dL (ref 8.9–10.3)
Chloride: 94 mmol/L — ABNORMAL LOW (ref 98–111)
Creatinine, Ser: 1.22 mg/dL — ABNORMAL HIGH (ref 0.44–1.00)
GFR calc Af Amer: >60 mL/min (ref >60)
GFR calc non Af Amer: 52 mL/min — ABNORMAL LOW (ref >60)
Glucose, Bld: 160 mg/dL — ABNORMAL HIGH (ref 70–99)
Potassium: 2.9 mmol/L — ABNORMAL LOW (ref 3.5–5.1)
Sodium: 138 mmol/L (ref 135–145)

## 2020-04-05 LAB — MAGNESIUM: Magnesium: 2.2 mg/dL (ref 1.7–2.4)

## 2020-04-05 MED ORDER — POTASSIUM CHLORIDE CRYS ER 20 MEQ PO TBCR
40.0000 meq | EXTENDED_RELEASE_TABLET | ORAL | Status: AC
  Administered 2020-04-05 (×3): 40 meq via ORAL
  Filled 2020-04-05 (×3): qty 2

## 2020-04-05 MED ORDER — FUROSEMIDE 10 MG/ML IJ SOLN
40.0000 mg | Freq: Two times a day (BID) | INTRAMUSCULAR | Status: DC
  Administered 2020-04-06 – 2020-04-10 (×9): 40 mg via INTRAVENOUS
  Filled 2020-04-05 (×9): qty 4

## 2020-04-05 MED ORDER — METHYLPREDNISOLONE SODIUM SUCC 40 MG IJ SOLR
20.0000 mg | Freq: Two times a day (BID) | INTRAMUSCULAR | Status: DC
  Administered 2020-04-05 – 2020-04-06 (×2): 20 mg via INTRAVENOUS
  Filled 2020-04-05 (×2): qty 1

## 2020-04-05 MED ORDER — SODIUM CHLORIDE 0.9 % IV SOLN
1.0000 g | INTRAVENOUS | Status: DC
  Administered 2020-04-05: 1 g via INTRAVENOUS
  Filled 2020-04-05: qty 10
  Filled 2020-04-05: qty 1

## 2020-04-05 NOTE — Progress Notes (Signed)
NAME:  [name], MRN:  [id number], DOB:  1970/11/26, LOS: 2 ADMISSION DATE:  04/01/2020, CONSULTATION DATE:  7/5 REFERRING MD:  Dahal, CHIEF COMPLAINT:  hemoptysis   Brief History   49 year old female w/ complicated oncological history and cough variant asthma. She is on [place] as out pt for uncontrolled asthma (felt allergic related as eosinophils on bronch 9%). Admitted w/ working dx of recurrent cellulitis/vasculitis w/ one BC growing from port w/ GNR. PCCM asked to eval for new onset of hemoptysis since admission.   Past Medical History  Breast cancer (bilateral mastectomy), Hodgkins lymphoma, thyroid cancer s/p resection, cervical cancer (all of these in "remission"). Also has h/o chest lymphoma. 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  Chronic chemotherapy induced cor pulmonale  Cough variant Asthma (followed by [facility]) Chronic respiratory failure in 3-4 liters;  no significant history of pulmonary fibrosis Last had bronchoscopy by Dr. Ardeen Jourdain 2020 she notes entry of right middle lobe noted be narrowed and very friable but no active bleeding there were thick white secretions present throughout the airway.  She sent cell count, AFB, fungal cytology and RVP from bronchial alveolar lavage these were all negative needle biopsies were sent from the [facility] at [facility].  There was no active bleeding noted.  She did have 9% eosinophilia noted.  Because of this her chronic respiratory failure was felt possibly due to uncontrolled allergic type asthma Vasculitis   Significant Hospital Events   7/2 admitted with working diagnosis of cellulitis 7/3: Epistaxis  7/4 significant cough, having episodes of hemoptysis.  Consults:  Pulmonary consulted  Procedures:    Significant Diagnostic Tests:  CT chest 7/5 > images independently reviewed> air trapping, no masses, no airway lesion  Micro Data:  7/2 blood >  1/2 gram negative rods  Antimicrobials:  7/3 zosyn >  7/3 vanc > 7/4  Interim history/subjective:  Had some slight epistaxis and hemoptysis overnight Frustrated with swelling in extremities Still has significant cough Minimal dyspnea  Objective   Blood pressure 120/80, pulse 81, temperature 98.5 F (36.9 C), temperature source Oral, resp. rate 18, height _0  (1.651 m), weight 89.8 kg, SpO2 100 %.        Intake/Output Summary (Last 24 hours) at 04/05/2020 1145 Last data filed at 04/05/2020 0919 Gross per 24 hour  Intake 1487.43 ml  Output 5850 ml  Net -4362.57 ml   Filed Weights   04/01/20 1456  Weight: 89.8 kg    Examination: General:  Resting comfortably in bed, cough on exam HENT: NCAT OP clear PULM: CTA B, normal effort CV: RRR, no mgr GI: BS+, soft, nontender MSK: normal bulk and tone Neuro: awake, alert, no distress, MAEW   Resolved Hospital Problem list     Assessment & Plan:  Hemoptysis> due to epistaxis and asthma exacerbation with known friable airways at baseline (has had bronchoscopy for this twice in the last few years), CT chest without new pathology; CT chest consistent with asthma > start solumedrol IV > continue Breo, use home QVar, prn albuterol > cough suppression> tessalon  Upper airway cough syndrome: > humidify oxygen  > continue PPI > voice rest, cough suppression encouraged  Cellulitis/GNR bacteremia: per [facility]   Best practice:   Per [facility]  Labs   CBC: Recent Labs  Lab 04/01/20 1517 04/04/20 0314 04/05/20 0343  WBC 9.3 9.4 10.1  NEUTROABS 6.9 5.3 6.1  HGB 10.8* 10.2* 10.4*  HCT 33.9* 32.6*  32.6*  MCV 93.4 94.2 93.9  PLT 287 278 989    Basic Metabolic Panel: Recent Labs  Lab 04/01/20 1517 04/02/20 0408 04/03/20 0330 04/04/20 0314 04/05/20 0343  NA 140 136  --  136 138  K 3.3* 3.5  --  3.2* 2.9*  CL 100 98  --  95* 94*  CO2 25 27  --  32 31  GLUCOSE 109* 178*  --  130* 160*  BUN 19 20  --  23* 26*  CREATININE  1.06* 1.14* 1.12* 1.15* 1.22*  CALCIUM 8.3* 8.0*  --  8.6* 8.9  MG 2.0  --   --   --  2.2   GFR: Estimated Creatinine Clearance: 61.7 mL/min (A) (by C-G formula based on SCr of 1.22 mg/dL (H)). Recent Labs  Lab 04/01/20 1517 04/04/20 0314 04/05/20 0343  PROCALCITON  --  <0.10  --   WBC 9.3 9.4 10.1    Liver Function Tests: Recent Labs  Lab 04/01/20 1517  AST 29  ALT 38  ALKPHOS 56  BILITOT 0.5  PROT 7.3  ALBUMIN 3.9   No results for input(s): LIPASE, AMYLASE in the last 168 hours. No results for input(s): AMMONIA in the last 168 hours.  ABG    Component Value Date/Time   PHART 7.296 (L) 04/02/2018 1343   PCO2ART 40.8 04/02/2018 1343   PO2ART 140.0 (H) 04/02/2018 1343   HCO3 19.9 (L) 04/02/2018 1343   HCO3 20.8 04/02/2018 1343   TCO2 21 (L) 04/02/2018 1343   TCO2 22 04/02/2018 1343   ACIDBASEDEF 6.0 (H) 04/02/2018 1343   ACIDBASEDEF 6.0 (H) 04/02/2018 1343   O2SAT 99.0 04/02/2018 1343   O2SAT 65.0 04/02/2018 1343     Coagulation Profile: No results for input(s): INR, PROTIME in the last 168 hours.  Cardiac Enzymes: No results for input(s): CKTOTAL, CKMB, CKMBINDEX, TROPONINI in the last 168 hours.  HbA1C: Hgb A1c MFr Bld  Date/Time Value Ref Range Status  04/05/2020 03:43 AM 6.2 (H) 4.8 - 5.6 % Final    Comment:    (NOTE) Pre diabetes:          5.7%-6.4%  Diabetes:              >6.4%  Glycemic control for   <7.0% adults with diabetes   03/18/2010 05:40 AM  <5.7 % Final   5.4 (NOTE)                                                                       According to the ADA Clinical Practice Recommendations for 2011, when HbA1c is used as a screening test:   >=6.5%   Diagnostic of Diabetes Mellitus           (if abnormal result  is confirmed)  5.7-6.4%   Increased risk of developing Diabetes Mellitus  References:Diagnosis and Classification of Diabetes Mellitus,Diabetes QJJH,4174,08(XKGYJ 1):S62-S69 and Standards of Medical Care in         Diabetes -  2011,Diabetes Care,2011,34  (Suppl 1):S11-S61.    CBG: No results for input(s): GLUCAP in the last 168 hours.   Critical care time: n/a     Roselie Awkward, MD [facility] Pager: [phone] Cell: [phone] If no response, call [phone]

## 2020-04-05 NOTE — Progress Notes (Signed)
PROGRESS NOTE  [name]  DOB: October 27, 1970  PCP: Su Grand, MD CHY:[id number]  DOA: 04/01/2020  LOS: 2 days   Chief Complaint  Patient presents with  . Arm Swelling   Brief narrative: [name] is a 49 y.o. female with PMH significant for multiple site cancers (breast cancer, hodgkins lymphoma, thyroid cancer s/p resection, cervical cancer - all in remission), chest lymphoma, vasculitis, post-surgical hypothyroidism, pituitary tumor s/p resection, adrenal insufficiency on steroids, nephrolithiasis, venous thrombosis, migraines, asthma, right heart failure, chronic O2 (3-4 L [place]), HTN, and recurrent ulcers and swelling of left arm thought to be possible vasculitis. Patient presented to the ED on 7/2 for worsening left arm swelling and redness.  Patient had similar presentation and admission last month.  She was treated with IV Lasix and antibiotics, she was discharged on a course of cefdinir and continued on oral Lasix.   She recently noticed recurrence of redness and swelling and was started on a course of Bactrim as an outpatient.  Redness and swelling continued to worsen, she also had a fever of 102 at home.  She was seen at 'symptom management clinic' at her oncologist office.  She was sent to the ED for IV antibiotics  As an outpatient she is followed by dermatology and rheumatology at [facility], [facility], [place] [facility], [facility], and neurology and gastroenterology at [facility].  In the ED, she was afebrile, heart rate in 80s and 90s, blood pressure normal range, oxygen saturation more than 95% on 3 L by nasal cannula. Labs unremarkable Ultrasound upper extremities negative for DVT. Chest x-ray normal. Patient was admitted to hospitalist service for further evaluation and management  Subjective: Patient was seen and examined this afternoon. She was walking back from the bathroom.  She sat down at the edge of her bed and throughout our 30 minutes  conversation, she did not need supplemental oxygen. Patient feels that the left arm cellulitis is improving.  She says her lower extremities swelling is worsening.  Her biggest concern is intermittent flare ups of cough mostly at night.  She says she could not sleep well last night because of cough.  She had 2-3 episodes of epistaxis and hemoptysis as well.  Chart reviewed. No fever noted since admission. Blood pressure stable. Potassium level low at 2.9 this morning.  Creatinine creeping up, 1.22 this morning A1c 6.2  Assessment/Plan: Recurrent cellulitis Immunocompromise status -presented with worsening left arm swelling and fever of 102 at home. -Immunocompromised status and history of recurrent left upper extremity cellulitis.   -No fever documented in the hospital. WBC count normal.   -Ultrasound with no evidence of upper extremity DVT.  -Initiated on broad-spectrum antibiotic coverage. -Antibiotics has been now narrowed down to IV Rocephin. -Cellulitis improving. -Continue to monitor clinically.  Sphingomonas species in blood culture -Blood culture sent on admission showed Sphingomonas species. Final report pending.  -Curb side consult with ID Dr. Baxter Flattery today. We'll continue IV Rocephin. -Repeat blood culture ordered for tomorrow.  -At patient's request, 1 dose of oral Diflucan given on 7/5 for vaginal candidiasis.  Intermittent cough spells Epistaxes/hemoptysis History of asthma/fibroelastosis -Patient has intermittent flareups of coughing spells which makes her wheezing, anxious, and hypoxic. -For last 3 days, she also has associated episodes of epistaxis and hemoptysis on coughing.  -Patient reports history of hemoptysis in the past and underwent bronchoscopy by pulmonary Dr. Ebony Hail in February 2020.  -CT chest was obtained on 7/5.  No significant lower airway disease. -Pulmonary consultation  obtained.  -Suspected to have upper airway cough syndrome -Started on  [employer]  Also added on IV Solu-Medrol on top of her baseline oral hydrocortisone.  Chronic respiratory failure with hypoxia -On 3-4 L of oxygen at home. -She seems to only intermittently need supplemental oxygen especially when she is having coughing spells.   -Cont home qvar, breo, Tessalon Perles -on outpt monthly nucala injection, recently received  Adrenal insufficiency  -secondary to pituitary resection for adenoma (2002, status post gamma knife surgery for regrowth of tumor in 2015) -Takes hydrocortisone 20 mg in the morning and 10 mg in the evening. -Same resumed.  Steroid-induced fluid retention History of chronic CHF with reduced EF -chronic hydrocortisone might have led her to have fluid retention. -Patient has puffy extremities.  At home, patient was on Lasix 40 mg twice daily.  -Albumin level normal at 3.9. -Echocardiogram from May 2021 with EF 65 to 70%, normal LV function, normal RV function, moderate tricuspid regurgitation, pulmonary artery systolic pressure mildly elevated to 35. -She is currently Lasix IV 40 mg 3 times daily. -Net diuresis of ??? 6 L in last 24 hours and 10 L since admission.  Unsure if this is true.  Also monitor daily weight. -Creatinine is trending down, 1.22 today.  Reduce Lasix to 40 mg IV twice daily today. -Continue to monitor renal function and electrolytes.  Hypokalemia -Potassium low at 2.9 today. oral replacement given. -Repeat BMP tomorrow.  Anxiety disorder -I think unchecked anxiety is an important player in her perception of her health.  She definitely has history of multiple cancers and it is impressive how she has been able to manage a quality life with a husband and 3 kids at home.  However, she seems to have underlying anxiety disorder which is not controlled.  She used to see a Teacher, music at [facility].  Has not seen one for a long time.  She continues to take Klonopin but at a lower dose than she was supposed to.  She actually  does not see any point in taking Klonopin at all.   -Last several days, patient had been not willing to consider anxiety as an important component of her issues.  Today, she opened up more and spoke to me about her difficult past, bursted still in tears.  I suggested an outpatient evaluation by psychiatry.  If possible, she actually wants to see a psychiatry while in the hospital. I called for psychiatry consultation this afternoon.  -Currently on Lexapro, clonazepam    Chronic pain/chronic nausea -Patient has a port and uses IV Benadryl and oral Dilaudid at home. -Currently on IV Benadryl, IV Dilaudid and Marinol.  Insomnia -ok to use home zolpidem  Hypothyroid -tsh low, t4 wnl last hospitalization -Continue Synthroid  Gout -home colchicine, asymptomatic  HTN  -Blood pressure normal with metoprolol and Lasix.   -Also continue aspirin 325 mg daily, rosuvastatin.  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 Code Status:   Code Status: DNR.   Nutritional status: Body mass index is 32.95 kg/m.     Diet Order            Diet regular Room service appropriate? Yes; Fluid consistency: Thin  Diet effective now  DVT prophylaxis: Place and maintain sequential compression device Start: 04/04/20 1336   Antimicrobials:  None Fluid: None  Consultants: Oncology Family Communication:  None at bedside  Status is: Inpatient  Remains inpatient appropriate because:Ongoing diagnostic testing needed not appropriate for outpatient work up and IV treatments appropriate due to intensity of illness or inability to take PO  Dispo: The patient is from: Home              Anticipated  d/c is to: Home              Anticipated d/c date is: 3 days              Patient currently is not medically stable to d/c.   Infusions:  . cefTRIAXone (ROCEPHIN)  IV      Scheduled Meds: . aspirin  325 mg Oral QHS  . beclomethasone  1 puff Inhalation BID  . Chlorhexidine Gluconate Cloth  6 each Topical Daily  . clonazePAM  1 mg Oral BID  . colchicine  0.6 mg Oral Q1500  . dexlansoprazole  60 mg Oral Daily  . escitalopram  20 mg Oral QPM  . fluticasone furoate-vilanterol  1 puff Inhalation Daily  . [START ON 04/06/2020] furosemide  40 mg Intravenous BID BM  . guaiFENesin  1,200 mg Oral BID  . hydrocortisone  10 mg Oral QPM  . hydrocortisone  20 mg Oral Q breakfast  . levothyroxine  112 mcg Oral Q0600  . methylPREDNISolone (SOLU-MEDROL) injection  20 mg Intravenous Q12H  . metoprolol succinate  37.5 mg Oral Daily  . rosuvastatin  10 mg Oral Daily  . topiramate  150 mg Oral QHS  . zolpidem  10 mg Oral QHS    Antimicrobials: Anti-infectives (From admission, onward)   Start     Dose/Rate Route Frequency Ordered Stop   04/05/20 1800  cefTRIAXone (ROCEPHIN) 1 g in sodium chloride 0.9 % 100 mL IVPB     Discontinue     1 g 200 mL/hr over 30 Minutes Intravenous Every 24 hours 04/05/20 0850     04/04/20 1100  fluconazole (DIFLUCAN) tablet 150 mg        150 mg Oral  Once 04/04/20 1024 04/04/20 1431   04/03/20 0100  vancomycin (VANCOCIN) IVPB 1000 mg/200 mL premix  Status:  Discontinued        1,000 mg 200 mL/hr over 60 Minutes Intravenous Every 12 hours 04/02/20 1203 04/04/20 1019   04/02/20 1230  piperacillin-tazobactam (ZOSYN) IVPB 3.375 g        3.375 g 12.5 mL/hr over 240 Minutes Intravenous Every 8 hours 04/02/20 1203 04/05/20 0922   04/02/20 1215  vancomycin (VANCOREADY) IVPB 1750 mg/350 mL        1,750 mg 175 mL/hr over 120 Minutes Intravenous  Once 04/02/20 1203 04/02/20 1433   04/02/20 1200  piperacillin-tazobactam (ZOSYN) IVPB 3.375 g  Status:  Discontinued        3.375  g 100 mL/hr over 30 Minutes Intravenous  Once 04/02/20 1145 04/02/20 1203   04/02/20 1200  vancomycin (VANCOCIN) IVPB 1000 mg/200 mL premix  Status:  Discontinued        1,000 mg 200 mL/hr over 60 Minutes Intravenous  Once 04/02/20 1145 04/02/20 1204      PRN meds: albuterol, benzonatate, diphenhydrAMINE, dronabinol, HYDROmorphone (DILAUDID) injection, LORazepam, oxyCODONE-acetaminophen, Rimegepant Sulfate, sodium chloride flush, sucralfate   Objective: Vitals:   04/05/20 0556 04/05/20 1532  BP: 120/80 (!) 144/85  Pulse: 81 89  Resp:  18 16  Temp: 98.5 F (36.9 C) 98.4 F (36.9 C)  SpO2: 100% 98%    Intake/Output Summary (Last 24 hours) at 04/05/2020 1540 Last data filed at 04/05/2020 1234 Gross per 24 hour  Intake 1582.98 ml  Output 4100 ml  Net -2517.02 ml   Filed Weights   04/01/20 1456  Weight: 89.8 kg   Weight change:  Body mass index is 32.95 kg/m.   Physical Exam: General exam: Not in physical distress. Skin: No rashes, lesions or ulcers. HEENT: Atraumatic, normocephalic, supple neck, no obvious bleeding Lungs: CTAB, but on deep breathing, she starts to have coughing spell. CVS: Regular rate and rhythm, no murmur GI/Abd soft, nontender, nondistended, bowel sound present CNS: Alert, awake, oriented x3  Psychiatry: tearful at one time during the conversation today. Extremities: Overall puffy extremities. No pedal edema, no calf tenderness, bilateral upper extremity swelling present more on the left than the right, improving left upper extremity cellulitis.   Data Review: I have personally reviewed the laboratory data and studies available.  Recent Labs  Lab 04/01/20 1517 04/04/20 0314 04/05/20 0343  WBC 9.3 9.4 10.1  NEUTROABS 6.9 5.3 6.1  HGB 10.8* 10.2* 10.4*  HCT 33.9* 32.6* 32.6*  MCV 93.4 94.2 93.9  PLT 287 278 277   Recent Labs  Lab 04/01/20 1517 04/02/20 0408 04/03/20 0330 04/04/20 0314 04/05/20 0343  NA 140 136  --  136 138  K 3.3* 3.5   --  3.2* 2.9*  CL 100 98  --  95* 94*  CO2 25 27  --  32 31  GLUCOSE 109* 178*  --  130* 160*  BUN 19 20  --  23* 26*  CREATININE 1.06* 1.14* 1.12* 1.15* 1.22*  CALCIUM 8.3* 8.0*  --  8.6* 8.9  MG 2.0  --   --   --  2.2    Signed, Terrilee Croak, MD [facility] Pager: [phone] (Secure Chat preferred). 04/05/2020

## 2020-04-05 NOTE — Plan of Care (Signed)
  Problem: Education: Goal: Knowledge of General Education information will improve Description: Including pain rating scale, medication(s)/side effects and non-pharmacologic comfort measures Outcome: Progressing   Problem: Health Behavior/Discharge Planning: Goal: Ability to manage health-related needs will improve Outcome: Progressing   Problem: Nutrition: Goal: Adequate nutrition will be maintained Outcome: Progressing   Problem: Elimination: Goal: Will not experience complications related to urinary retention Outcome: Progressing

## 2020-04-05 NOTE — Plan of Care (Signed)
  Problem: Education: Goal: Knowledge of General Education information will improve Description: Including pain rating scale, medication(s)/side effects and non-pharmacologic comfort measures Outcome: Progressing   Problem: Nutrition: Goal: Adequate nutrition will be maintained Outcome: Progressing   Problem: Elimination: Goal: Will not experience complications related to urinary retention Outcome: Progressing

## 2020-04-06 LAB — BASIC METABOLIC PANEL
Anion gap: 13 (ref 5–15)
BUN: 25 mg/dL — ABNORMAL HIGH (ref 6–20)
CO2: 23 mmol/L (ref 22–32)
Calcium: 9.2 mg/dL (ref 8.9–10.3)
Chloride: 99 mmol/L (ref 98–111)
Creatinine, Ser: 1.04 mg/dL — ABNORMAL HIGH (ref 0.44–1.00)
GFR calc Af Amer: >60 mL/min (ref >60)
GFR calc non Af Amer: >60 mL/min (ref >60)
Glucose, Bld: 222 mg/dL — ABNORMAL HIGH (ref 70–99)
Potassium: 3.9 mmol/L (ref 3.5–5.1)
Sodium: 135 mmol/L (ref 135–145)

## 2020-04-06 LAB — MAGNESIUM: Magnesium: 2.3 mg/dL (ref 1.7–2.4)

## 2020-04-06 LAB — CBC WITH DIFFERENTIAL/PLATELET
Abs Immature Granulocytes: 0.12 10*3/uL — ABNORMAL HIGH (ref 0.00–0.07)
Basophils Absolute: 0.1 10*3/uL (ref 0.0–0.1)
Basophils Relative: 0 %
Eosinophils Absolute: 0 10*3/uL (ref 0.0–0.5)
Eosinophils Relative: 0 %
HCT: 34.3 % — ABNORMAL LOW (ref 36.0–46.0)
Hemoglobin: 10.6 g/dL — ABNORMAL LOW (ref 12.0–15.0)
Immature Granulocytes: 1 %
Lymphocytes Relative: 11 %
Lymphs Abs: 1.4 10*3/uL (ref 0.7–4.0)
MCH: 29.3 pg (ref 26.0–34.0)
MCHC: 30.9 g/dL (ref 30.0–36.0)
MCV: 94.8 fL (ref 80.0–100.0)
Monocytes Absolute: 0.7 10*3/uL (ref 0.1–1.0)
Monocytes Relative: 6 %
Neutro Abs: 10 10*3/uL — ABNORMAL HIGH (ref 1.7–7.7)
Neutrophils Relative %: 82 %
Platelets: 304 10*3/uL (ref 150–400)
RBC: 3.62 MIL/uL — ABNORMAL LOW (ref 3.87–5.11)
RDW: 15.3 % (ref 11.5–15.5)
WBC: 12.3 10*3/uL — ABNORMAL HIGH (ref 4.0–10.5)
nRBC: 0 % (ref 0.0–0.2)

## 2020-04-06 MED ORDER — SODIUM CHLORIDE 0.9 % IV SOLN
2.0000 g | INTRAVENOUS | Status: DC
  Administered 2020-04-06 – 2020-04-10 (×5): 2 g via INTRAVENOUS
  Filled 2020-04-06: qty 2
  Filled 2020-04-06 (×2): qty 20
  Filled 2020-04-06 (×2): qty 2

## 2020-04-06 MED ORDER — MAGIC MOUTHWASH W/LIDOCAINE
10.0000 mL | Freq: Four times a day (QID) | ORAL | Status: AC | PRN
  Administered 2020-04-06 – 2020-04-07 (×3): 10 mL via ORAL
  Filled 2020-04-06 (×6): qty 10

## 2020-04-06 MED ORDER — HYDROCORTISONE NA SUCCINATE PF 100 MG IJ SOLR
50.0000 mg | Freq: Two times a day (BID) | INTRAMUSCULAR | Status: DC
  Administered 2020-04-06 – 2020-04-08 (×5): 50 mg via INTRAVENOUS
  Filled 2020-04-06 (×5): qty 1

## 2020-04-06 NOTE — Consult Note (Signed)
[facility] Face-to-Face Psychiatry Consult   Reason for Consult:  "Anxiety disorder"  Referring Physician:  Dr. Pietro Cassis Patient Identification: [name] MRN:  [id number] Principal Diagnosis: Nausea Diagnosis:  Principal Problem:   Nausea Active Problems:   Hx of Hodgkins lymphoma   History of ductal carcinoma in situ (DCIS) of breast   Thyroid cancer ([place])   Adrenal insufficiency (HCC)   Hypertension   Nephrolithiasis   Oxygen dependent   Migraine   Pituitary adenoma ([place])   History of cervical cancer   Chronic respiratory failure with hypoxia ([place])   Port-A-Cath in place   Generalized weakness   Total Time spent with patient: 45 minutes  Subjective: Patient states " someone told me recently that bravery is fear that sentence prayers."  HPI: [name] is a 49 y.o. female patient. Patient assessed by nurse practitioner. Patient alert and oriented, answers appropriately.  Patient reports "I am interested in the possibility of Xanax versus Klonopin and possibly a different dosage of Lexapro." Patient states "we do not have to focus on changing these medications during this admission however my Lexapro has not been increased since I started at approximately 4 years ago."  Patient denies suicidal ideations. Patient denies history of suicide attempts, denies history of self-harm behaviors. Patient denies homicidal ideations. Patient denies hallucinations, patient denies symptoms of paranoia.  Patient reports a lengthy and complex medical history including "I have been battling cancer since the age of 27, chemo has caused right-sided heart failure and chronic respiratory failure."  Patient currently resides in [place] with husband and 72 year old son. Patient reports she has been disabled since 2016 related to medical diagnoses. Patient denies access to weapons. Patient denies both alcohol and substance use. 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 Patient reports "my mom just lets me be me, I can call her and totally breakdown." Patient states "my brother is always there for me, I know he will be there when I called."  Patient reports she is currently followed by outpatient psychiatry, patient reports current psychiatrist located in [place] she would like to find a psychiatrist closer to [place]. Patient reports current medications include Klonopin 16m two times daily, Ambien 153mnightly and Lexapro 2056maily. Patient reports she is currently followed by a counselor who she speaks with approximately 3 times each week.      Past Psychiatric History: Anxiety  Risk to Self:  Denies Risk to Others:  Denies Prior Inpatient Therapy:  Denies Prior Outpatient Therapy:  [place] Medical History:  Past Medical History:  Diagnosis Date  . Addison's disease ([facility] . Adrenal insufficiency ([facility] . Anemia   . Anxiety   . Aortic stenosis   . Aortic stenosis   . Appendicitis 12/19/09  . Appendicitis   . Breast cancer ([place]  STATUS POST BILATERAL MASTECTOMY. STATUS POST RECONSTRUCTION. SHE HAD SILICONE BREAST IMPLANTS AND THE LEFT IMPLANT IS LEAKING SLIGHTLY  . Cellulitis of right middle finger 11/07/2018  . Cervical cancer ([facility]/24/2020  . Chest pain   . CHF with right heart failure ([facility]/18/2018  . Chronic respiratory failure with hypoxia ([place]/24/2020  . Cough variant asthma 04/13/2019  . Depression   . GERD (gastroesophageal reflux disease)   . Headache  migraines  . Heart murmur   . History of kidney stones   . Hodgkin lymphoma (HCC)    STATUS POST MANTLE RADIATION  . Hodgkin's lymphoma ([place])    1987  . Hypertension   . Hypoxia   . Necrotizing fasciitis ([place]) 12/23/2018  . Non-ischemic  cardiomyopathy ([place])   . Osteoporosis   . Palpitations   . Pituitary adenoma ([place]) 12/23/2018  . Pneumonia   . PONV (postoperative nausea and vomiting)   . Pre-diabetes    per pt; no meds  . Pulmonary hypertension ([place]) 12/23/2018  . Raynaud phenomenon   . Right heart failure ([place]) 04/17/2017  . Supplemental oxygen dependent    3 liters  . SVT (supraventricular tachycardia) ([place])   . Tachycardia   . Thyroid cancer ([place])    STATUS POST SURGICAL REMOVAL-CURRENT ON THYROID REPLACEMENT    Past Surgical History:  Procedure Laterality Date  . ABDOMINAL HYSTERECTOMY    . AMPUTATION Left 01/30/2019   Procedure: Left Index finger amputation with flap reconstruction and repair reconstruction;  Surgeon: Roseanne Kaufman, MD;  Location: [place];  Service: Orthopedics;  Laterality: Left;  . APPENDECTOMY    . CARDIAC CATHETERIZATION  05/18/09   NORMAL CATH  . I & D EXTREMITY Left 12/23/2018   Procedure: IRRIGATION AND DEBRIDEMENT HAND / INDEX FINGER;  Surgeon: Roseanne Kaufman, MD;  Location: [facility];  Service: Orthopedics;  Laterality: Left;  Marland Kitchen MASTECTOMY    . PITUITARY SURGERY    . RIGHT/LEFT HEART CATH AND CORONARY ANGIOGRAPHY N/A 04/02/2018   Procedure: RIGHT/LEFT HEART CATH AND CORONARY ANGIOGRAPHY;  Surgeon: Burnell Blanks, MD;  Location: [place];  Service: Cardiovascular;  Laterality: N/A;  . TOTAL THYROIDECTOMY    . VIDEO BRONCHOSCOPY Bilateral 11/14/2018   Procedure: VIDEO BRONCHOSCOPY WITHOUT FLUORO;  Surgeon: Margaretha Seeds, MD;  Location: [facility];  Service: Cardiopulmonary;  Laterality: Bilateral;  . VIDEO BRONCHOSCOPY WITH ENDOBRONCHIAL ULTRASOUND N/A 11/19/2018   Procedure: VIDEO BRONCHOSCOPY WITH ENDOBRONCHIAL ULTRASOUND;  Surgeon: Margaretha Seeds, MD;  Location: [facility];  Service: Thoracic;  Laterality: N/A;   Family History:  Family History  Family history unknown: Yes   Family Psychiatric  History: Unknown, patient reports she is adopted Social History:   Social History   Substance and Sexual Activity  Alcohol Use Yes   Comment: social      Social History   Substance and Sexual Activity  Drug Use No    Social History   Socioeconomic History  . Marital status: Married    Spouse name: Not on file  . Number of children: Not on file  . Years of education: Not on file  . Highest education level: Not on file  Occupational History  . Not on file  Tobacco Use  . Smoking status: Never Smoker  . Smokeless tobacco: Never Used  Vaping Use  . Vaping Use: Never used  Substance and Sexual Activity  . Alcohol use: Yes    Comment: social   . Drug use: No  . Sexual activity: Yes    Birth control/protection: Surgical  Other Topics Concern  . Not on file  Social History Narrative  . Not on file   Social Determinants of Health   Financial Resource Strain:   . Difficulty of Paying Living Expenses:   Food Insecurity:   . Worried About Charity fundraiser in the Last Year:   . Arboriculturist in the Last Year:   Transportation Needs:   .  Lack of Transportation (Medical):   Marland Kitchen Lack of Transportation (Non-Medical):   Physical Activity:   . Days of Exercise per Week:   . Minutes of Exercise per Session:   Stress:   . Feeling of Stress :   Social Connections:   . Frequency of Communication with Friends and Family:   . Frequency of Social Gatherings with Friends and Family:   . Attends Religious Services:   . Active Member of Clubs or Organizations:   . Attends Archivist Meetings:   Marland Kitchen Marital Status:    Additional Social History:    Allergies:   Allergies  Allergen Reactions  . Mushroom Extract Complex Anaphylaxis  . Na Ferric Gluc Cplx In Sucrose Anaphylaxis  . Cymbalta [Duloxetine Hcl] Swelling and Anxiety  . Succinylcholine Other (See Comments)    Lock Jaw  . Buprenorphine Hcl Hives  . Compazine Other (See Comments)    Altered mental status Aggression  . Metoclopramide Other (See Comments)    Dystonia  .  Morphine And Related Hives  . Ondansetron Hives and Rash  . Promethazine Hcl Hives  . Tegaderm Ag Mesh [Silver] Rash    Old formulation only, is able to tolerate new formulation    Labs:  Results for orders placed or performed during the hospital encounter of 04/01/20 (from the past 48 hour(s))  CBC with Differential/Platelet     Status: Abnormal   Collection Time: 04/05/20  3:43 AM  Result Value Ref Range   WBC 10.1 4.0 - 10.5 K/uL   RBC 3.47 (L) 3.87 - 5.11 MIL/uL   Hemoglobin 10.4 (L) 12.0 - 15.0 g/dL   HCT 32.6 (L) 36 - 46 %   MCV 93.9 80.0 - 100.0 fL   MCH 30.0 26.0 - 34.0 pg   MCHC 31.9 30.0 - 36.0 g/dL   RDW 15.3 11.5 - 15.5 %   Platelets 277 150 - 400 K/uL   nRBC 0.0 0.0 - 0.2 %   Neutrophils Relative % 61 %   Neutro Abs 6.1 1.7 - 7.7 K/uL   Lymphocytes Relative 26 %   Lymphs Abs 2.6 0.7 - 4.0 K/uL   Monocytes Relative 10 %   Monocytes Absolute 1.0 0 - 1 K/uL   Eosinophils Relative 2 %   Eosinophils Absolute 0.2 0 - 0 K/uL   Basophils Relative 1 %   Basophils Absolute 0.1 0 - 0 K/uL   Immature Granulocytes 0 %   Abs Immature Granulocytes 0.04 0.00 - 0.07 K/uL    Comment: Performed at [facility], [place] [address] [place], [place] [address]  Basic metabolic panel     Status: Abnormal   Collection Time: 04/05/20  3:43 AM  Result Value Ref Range   Sodium 138 135 - 145 mmol/L   Potassium 2.9 (L) 3.5 - 5.1 mmol/L   Chloride 94 (L) 98 - 111 mmol/L   CO2 31 22 - 32 mmol/L   Glucose, Bld 160 (H) 70 - 99 mg/dL    Comment: Glucose reference range applies only to samples taken after fasting for at least 8 hours.   BUN 26 (H) 6 - 20 mg/dL   Creatinine, Ser 1.22 (H) 0.44 - 1.00 mg/dL   Calcium 8.9 8.9 - 10.3 mg/dL   GFR calc non Af Amer 52 (L) >60 mL/min   GFR calc Af Amer >60 >60 mL/min   Anion gap 13 5 - 15    Comment: Performed at [facility], [place]  [address] [place], [place] [address]  Magnesium     Status: None   Collection  Time: 04/05/20  3:43 AM  Result Value Ref Range   Magnesium 2.2 1.7 - 2.4 mg/dL    Comment: Performed at [facility], [place] [address] [place], [place] [address]  Hemoglobin A1c     Status: Abnormal   Collection Time: 04/05/20  3:43 AM  Result Value Ref Range   Hgb A1c MFr Bld 6.2 (H) 4.8 - 5.6 %    Comment: (NOTE) Pre diabetes:          5.7%-6.4%  Diabetes:              >6.4%  Glycemic control for   <7.0% adults with diabetes    Mean Plasma Glucose 131.24 mg/dL    Comment: Performed at [place] [address] [place], [place] [address]  Basic metabolic panel     Status: Abnormal   Collection Time: 04/06/20  5:28 AM  Result Value Ref Range   Sodium 135 135 - 145 mmol/L   Potassium 3.9 3.5 - 5.1 mmol/L    Comment: DELTA CHECK NOTED NO VISIBLE HEMOLYSIS    Chloride 99 98 - 111 mmol/L   CO2 23 22 - 32 mmol/L   Glucose, Bld 222 (H) 70 - 99 mg/dL    Comment: Glucose reference range applies only to samples taken after fasting for at least 8 hours.   BUN 25 (H) 6 - 20 mg/dL   Creatinine, Ser 1.04 (H) 0.44 - 1.00 mg/dL   Calcium 9.2 8.9 - 10.3 mg/dL   GFR calc non Af Amer >60 >60 mL/min   GFR calc Af Amer >60 >60 mL/min   Anion gap 13 5 - 15    Comment: Performed at [facility], [place] [address], [place], [place] [address]  CBC with Differential/Platelet     Status: Abnormal   Collection Time: 04/06/20  5:28 AM  Result Value Ref Range   WBC 12.3 (H) 4.0 - 10.5 K/uL   RBC 3.62 (L) 3.87 - 5.11 MIL/uL   Hemoglobin 10.6 (L) 12.0 - 15.0 g/dL   HCT 34.3 (L) 36 - 46 %   MCV 94.8 80.0 - 100.0 fL   MCH 29.3 26.0 - 34.0 pg   MCHC 30.9 30.0 - 36.0 g/dL   RDW 15.3 11.5 - 15.5 %   Platelets 304 150 - 400 K/uL   nRBC 0.0 0.0 - 0.2 %   Neutrophils Relative % 82 %   Neutro Abs 10.0 (H) 1.7 - 7.7 K/uL   Lymphocytes Relative 11 %   Lymphs Abs 1.4 0.7 - 4.0 K/uL   Monocytes Relative 6 %   Monocytes Absolute 0.7 0 - 1 K/uL   Eosinophils  Relative 0 %   Eosinophils Absolute 0.0 0 - 0 K/uL   Basophils Relative 0 %   Basophils Absolute 0.1 0 - 0 K/uL   Immature Granulocytes 1 %   Abs Immature Granulocytes 0.12 (H) 0.00 - 0.07 K/uL    Comment: Performed at [facility], [place] [address], [place], [place] [address]  Magnesium     Status: None   Collection Time: 04/06/20  5:28 AM  Result Value Ref Range   Magnesium 2.3 1.7 - 2.4 mg/dL    Comment: Performed at [facility], [place] [address], [place], [place] [address]    Current Facility-Administered Medications  Medication Dose Route Frequency Provider Last Rate Last Admin  .  albuterol (PROVENTIL) (2.5 MG/3ML) 0.083% nebulizer solution 2.5 mg  2.5 mg Nebulization Q6H PRN Wouk, Ailene Rud, MD      . aspirin tablet 325 mg  325 mg Oral QHS Gwynne Edinger, MD   325 mg at 04/05/20 2231  . beclomethasone (QVAR) 80 MCG/ACT inhaler 1 puff  1 puff Inhalation BID Dahal, Binaya, MD      . benzonatate (TESSALON) capsule 200 mg  200 mg Oral TID PRN Juanito Doom, MD   200 mg at 04/06/20 0620  . cefTRIAXone (ROCEPHIN) 1 g in sodium chloride 0.9 % 100 mL IVPB  1 g Intravenous Q24H Dahal, Binaya, MD 200 mL/hr at 04/05/20 1726 1 g at 04/05/20 1726  . Chlorhexidine Gluconate Cloth 2 % PADS 6 each  6 each Topical Daily Wouk, Ailene Rud, MD   6 each at 04/04/20 0830  . clonazePAM (KLONOPIN) tablet 1 mg  1 mg Oral BID Gwynne Edinger, MD   1 mg at 04/05/20 1038  . colchicine tablet 0.6 mg  0.6 mg Oral Q1500 Gwynne Edinger, MD   0.6 mg at 04/05/20 1428  . dexlansoprazole (DEXILANT) capsule 60 mg  60 mg Oral Daily Gwynne Edinger, MD   60 mg at 04/06/20 6659  . diphenhydrAMINE (BENADRYL) injection 50 mg  50 mg Intravenous Q4H PRN Terrilee Croak, MD   50 mg at 04/06/20 9357  . dronabinol (MARINOL) capsule 2.5 mg  2.5 mg Oral BID PRN Gwynne Edinger, MD   2.5 mg at 04/02/20 0841  . escitalopram (LEXAPRO) tablet 20 mg  20 mg Oral QPM Wouk, Ailene Rud, MD   20 mg at 04/05/20 1724  . fluticasone furoate-vilanterol (BREO ELLIPTA) 200-25 MCG/INH 1 puff  1 puff Inhalation Daily Gwynne Edinger, MD   1 puff at 04/06/20 [id number]  . furosemide (LASIX) injection 40 mg  40 mg Intravenous BID BM Dahal, Binaya, MD      . hydrocortisone sodium succinate (SOLU-CORTEF) 100 MG injection 50 mg  50 mg Intravenous Q12H Gherghe, Costin M, MD      . HYDROmorphone (DILAUDID) injection 0.5 mg  0.5 mg Intravenous Q4H PRN Dahal, Marlowe Aschoff, MD   0.5 mg at 04/06/20 9390  . levothyroxine (SYNTHROID) tablet 112 mcg  112 mcg Oral Q0600 Gwynne Edinger, MD   112 mcg at 04/06/20 3009  . LORazepam (ATIVAN) tablet 0.5 mg  0.5 mg Oral BID PRN Wouk, Ailene Rud, MD      . metoprolol succinate (TOPROL-XL) 24 hr tablet 37.5 mg  37.5 mg Oral Daily Gwynne Edinger, MD   37.5 mg at 04/05/20 1038  . oxyCODONE-acetaminophen (PERCOCET/ROXICET) 5-325 MG per tablet 1 tablet  1 tablet Oral Q8H PRN Dahal, Binaya, MD      . Rimegepant Sulfate TBDP 75 mg  75 mg Oral Daily PRN Wouk, Ailene Rud, MD      . rosuvastatin (CRESTOR) tablet 10 mg  10 mg Oral Daily Gwynne Edinger, MD   10 mg at 04/05/20 1038  . sodium chloride flush (NS) 0.9 % injection 10-40 mL  10-40 mL Intracatheter PRN Wouk, Ailene Rud, MD      . sucralfate (CARAFATE) tablet 1 g  1 g Oral Daily PRN Wouk, Ailene Rud, MD      . topiramate (TOPAMAX) tablet 150 mg  150 mg Oral QHS Gwynne Edinger, MD   150 mg at 04/05/20 2232  . zolpidem (AMBIEN) tablet 10 mg  10 mg Oral QHS Wouk, Luckey,  MD   10 mg at 04/05/20 2231    Musculoskeletal: Strength & Muscle Tone: within normal limits Gait & Station: normal Patient leans: N/A  Psychiatric Specialty Exam: Physical Exam Vitals and nursing note reviewed.  Constitutional:      Appearance: She is well-developed.  HENT:     Head: Normocephalic.  Cardiovascular:     Rate and Rhythm: Normal rate.  Pulmonary:     Effort: Pulmonary effort is normal.   Neurological:     Mental Status: She is alert and oriented to person, place, and time.  Psychiatric:        Attention and Perception: Attention and perception normal.        Mood and Affect: Mood is depressed.        Speech: Speech normal.        Behavior: Behavior normal. Behavior is cooperative.        Thought Content: Thought content normal.        Cognition and Memory: Cognition normal.        Judgment: Judgment normal.     Review of Systems  Constitutional: Negative.   HENT: Negative.   Eyes: Negative.   Respiratory: Negative.   Cardiovascular: Negative.   Gastrointestinal: Negative.   Genitourinary: Negative.   Musculoskeletal: Negative.   Skin: Negative.   Neurological: Negative.   Psychiatric/Behavioral: Negative.     Blood pressure 136/85, pulse 90, temperature 98 F (36.7 C), resp. rate (!) 22, height 5' 5" (1.651 m), weight 91.1 kg, SpO2 99 %.Body mass index is 33.42 kg/m.  General Appearance: Casual and Fairly Groomed  Eye Contact:  Good  Speech:  Clear and Coherent and Normal Rate  Volume:  Normal  Mood:  Depressed  Affect:  Congruent and Depressed  Thought Process:  Coherent, Goal Directed and Descriptions of Associations: Intact  Orientation:  Full (Time, Place, and Person)  Thought Content:  WDL and Logical  Suicidal Thoughts:  No  Homicidal Thoughts:  No  Memory:  Immediate;   Good Recent;   Good Remote;   Good  Judgement:  Good  Insight:  Good  Psychomotor Activity:  Normal  Concentration:  Concentration: Good and Attention Span: Good  Recall:  Good  Fund of Knowledge:  Good  Language:  Good  Akathisia:  No  Handed:  Right  AIMS (if indicated):     Assets:  Communication Skills Desire for Improvement Financial Resources/Insurance Housing Intimacy Leisure Time Physical Health Resilience Social Support  ADL's:  Intact  Cognition:  WNL  Sleep:        Treatment Plan Summary: Patient reviewed with Dr. Dwyane Dee. Patient currently denies  all crisis criteria. Patient pleasant and cooperative, participates actively in assessment.  Based on my assessment today, patient would benefit from continued outpatient psychiatry follow-up.  Recommendations: -Consider increase Lexapro to 30 mg daily -Continue current Klonopin 1 mg twice daily  -Outpatient psychiatry resources in [place] area provided to patient: [place] [facility] outpatient at [facility] to telephone patient today to discuss outpatient psychiatry follow-up in [place] area.  Disposition: No evidence of imminent risk to self or others at present.   Patient does not meet criteria for psychiatric inpatient admission. Supportive therapy provided about ongoing stressors.  Emmaline Kluver, FNP 04/06/2020 9:53 AM

## 2020-04-06 NOTE — Consult Note (Signed)
[facility]  Total days of antibiotics 5/2 ceftriaxone         Reason for Consult: sphingomonas bacteremia   Referring Physician: gherghe  Principal Problem:   Nausea Active Problems:   Hx of Hodgkins lymphoma   History of ductal carcinoma in situ (DCIS) of breast   Thyroid cancer ([place])   Adrenal insufficiency (HCC)   Hypertension   Nephrolithiasis   Oxygen dependent   Migraine   Pituitary adenoma ([place])   History of cervical cancer   Chronic respiratory failure with hypoxia (HCC)   Port-A-Cath in place   Generalized weakness    HPI: [name] is a 49 y.o. female with history of port a cath due to poor vascular access since having survived  multiple cancers but now in remision, hx of pituitary tumor s/p resection c/b adrenal insufficiency and on chronic hydrocortisone. 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 She was admitted on 7/2 for nausea and fatigue plus acute onset of fever of temp of 102F at home as well as worsening open sores to left hand with spreading erythema to left arm but no discomfort at port a cath. She also noticed increased edema to upper extremities. Her infectious work up revealed sphingomonas paucimobilis. She was originally on cefepime empirically and now narrowed to ceftriaxone. She still feels poorly. She had repeat blood cx done today.  She was given bactrim roughly 3 days prior to admit for ulcers on fingers and then started having fever and streaking of left arm- concerning for cellulitis  She is now concern with edema to upper and lower extremity within last 2 days. She feels that edema in Feet and ankles, joint aches. " I feel like too much lasix" she did have slight increase in cr yesterday with hypokalemia of 2.9.   Pulmonary has also seen her due to new complaint of hemoptysis. Has seen pccm on previous admission, where she underwent bronch. Suspect small superficial vessels that  bleeds with coughing bouts vs. Unclear if related to vasculitis.  Overall she feels poorly, anxious that her vasculitis/lesions on her hands will lead to further amputation  In terms of her portacath - she has not had any issues with it, she maintains exceptional hygiene - she reports.  Past Medical History:  Diagnosis Date  . Addison's disease ([place])   . Adrenal insufficiency ([place])   . Anemia   . Anxiety   . Aortic stenosis   . Aortic stenosis   . Appendicitis 12/19/09  . Appendicitis   . Breast cancer ([place])    STATUS POST BILATERAL MASTECTOMY. STATUS POST RECONSTRUCTION. SHE HAD SILICONE BREAST IMPLANTS AND THE LEFT IMPLANT IS LEAKING SLIGHTLY  . Cellulitis of right middle finger 11/07/2018  . Cervical cancer ([place]) 12/23/2018  . Chest pain   . 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 Chronic respiratory failure with hypoxia ([place]) 12/23/2018  . Cough variant asthma 04/13/2019  . Depression   . GERD (gastroesophageal reflux disease)   . Headache    migraines  . Heart murmur   . History of kidney stones   . Hodgkin lymphoma (HCC)    STATUS POST MANTLE RADIATION  . Hodgkin's lymphoma ([place])    1987  . Hypertension   . Hypoxia   . Necrotizing fasciitis ([place]) 12/23/2018  . Non-ischemic cardiomyopathy ([place])   . Osteoporosis   . Palpitations   . Pituitary adenoma ([place]) 12/23/2018  . Pneumonia   .  PONV (postoperative nausea and vomiting)   . Pre-diabetes    per pt; no meds  . Pulmonary hypertension ([place]) 12/23/2018  . Raynaud phenomenon   . Right heart failure ([place]) 04/17/2017  . Supplemental oxygen dependent    3 liters  . SVT (supraventricular tachycardia) ([place])   . Tachycardia   . Thyroid cancer ([place])    STATUS POST SURGICAL REMOVAL-CURRENT ON THYROID REPLACEMENT    Allergies:  Allergies  Allergen Reactions  . Mushroom Extract Complex Anaphylaxis  . Na Ferric Gluc Cplx In Sucrose Anaphylaxis  . Cymbalta [Duloxetine Hcl] Swelling and Anxiety  . Succinylcholine  Other (See Comments)    Lock Jaw  . Buprenorphine Hcl Hives  . Compazine Other (See Comments)    Altered mental status Aggression  . Metoclopramide Other (See Comments)    Dystonia  . Morphine And Related Hives  . Ondansetron Hives and Rash  . Promethazine Hcl Hives  . Tegaderm Ag Mesh [Silver] Rash    Old formulation only, is able to tolerate new formulation    MEDICATIONS: . aspirin  325 mg Oral QHS  . beclomethasone  1 puff Inhalation BID  . Chlorhexidine Gluconate Cloth  6 each Topical Daily  . clonazePAM  1 mg Oral BID  . colchicine  0.6 mg Oral Q1500  . dexlansoprazole  60 mg Oral Daily  . escitalopram  20 mg Oral QPM  . fluticasone furoate-vilanterol  1 puff Inhalation Daily  . furosemide  40 mg Intravenous BID BM  . hydrocortisone sod succinate (SOLU-CORTEF) inj  50 mg Intravenous Q12H  . levothyroxine  112 mcg Oral Q0600  . metoprolol succinate  37.5 mg Oral Daily  . rosuvastatin  10 mg Oral Daily  . topiramate  150 mg Oral QHS  . zolpidem  10 mg Oral QHS    Social History   Tobacco Use  . Smoking status: Never Smoker  . Smokeless tobacco: Never Used  Vaping Use  . Vaping Use: Never used  Substance Use Topics  . Alcohol use: Yes    Comment: social   . Drug use: No    Family History  Family history unknown: Yes    Review of Systems - 12 point ros is negative except what is reported in hpi  OBJECTIVE: Temp:  [97.8 F (36.6 C)-98 F (36.7 C)] 98 F (36.7 C) (07/07 1421) Pulse Rate:  [89-91] 91 (07/07 1421) Resp:  [17-22] 17 (07/07 1421) BP: (124-136)/(81-88) 124/81 (07/07 1421) SpO2:  [96 %-100 %] 96 % (07/07 1421) Weight:  [91.1 kg] 91.1 kg (07/07 0500) .Physical Exam  Constitutional:  oriented to person, place, and time. appears well-developed and well-nourished. No distress.  HENT: [place]/AT, PERRLA, no scleral icterus Mouth/Throat: Oropharynx is clear and moist. + exudate.  Cardiovascular: Normal rate, regular rhythm and normal heart sounds.  Exam reveals no gallop and no friction rub.  No murmur heard.  Chest = port a cath no erythema or tenderness. Hx of bilateral mastectomy Pulmonary/Chest: Effort normal and breath sounds normal. No respiratory distress.  has no wheezes.  Neck = supple, no nuchal rigidity Abdominal: Soft. Bowel sounds are normal.  exhibits no distension. There is no tenderness.  Lymphadenopathy: no cervical adenopathy. No axillary adenopathy Neurological: alert and oriented to person, place, and time.  Skin: left forearm has trace erythema but appears improved per her description. Pitting edema to dorsum of hand. Small shallow ulcer < 0.5cm scattered on both hands no surrounding erythema Psychiatric: anxious   LABS: Results for orders  placed or performed during the hospital encounter of 04/01/20 (from the past 48 hour(s))  CBC with Differential/Platelet     Status: Abnormal   Collection Time: 04/05/20  3:43 AM  Result Value Ref Range   WBC 10.1 4.0 - 10.5 K/uL   RBC 3.47 (L) 3.87 - 5.11 MIL/uL   Hemoglobin 10.4 (L) 12.0 - 15.0 g/dL   HCT 32.6 (L) 36 - 46 %   MCV 93.9 80.0 - 100.0 fL   MCH 30.0 26.0 - 34.0 pg   MCHC 31.9 30.0 - 36.0 g/dL   RDW 15.3 11.5 - 15.5 %   Platelets 277 150 - 400 K/uL   nRBC 0.0 0.0 - 0.2 %   Neutrophils Relative % 61 %   Neutro Abs 6.1 1.7 - 7.7 K/uL   Lymphocytes Relative 26 %   Lymphs Abs 2.6 0.7 - 4.0 K/uL   Monocytes Relative 10 %   Monocytes Absolute 1.0 0 - 1 K/uL   Eosinophils Relative 2 %   Eosinophils Absolute 0.2 0 - 0 K/uL   Basophils Relative 1 %   Basophils Absolute 0.1 0 - 0 K/uL   Immature Granulocytes 0 %   Abs Immature Granulocytes 0.04 0.00 - 0.07 K/uL    Comment: Performed at [facility], [place] [address], [place], [place] [address]  Basic metabolic panel     Status: Abnormal   Collection Time: 04/05/20  3:43 AM  Result Value Ref Range   Sodium 138 135 - 145 mmol/L   Potassium 2.9 (L) 3.5 - 5.1 mmol/L   Chloride 94 (L) 98 - 111  mmol/L   CO2 31 22 - 32 mmol/L   Glucose, Bld 160 (H) 70 - 99 mg/dL    Comment: Glucose reference range applies only to samples taken after fasting for at least 8 hours.   BUN 26 (H) 6 - 20 mg/dL   Creatinine, Ser 1.22 (H) 0.44 - 1.00 mg/dL   Calcium 8.9 8.9 - 10.3 mg/dL   GFR calc non Af Amer 52 (L) >60 mL/min   GFR calc Af Amer >60 >60 mL/min   Anion gap 13 5 - 15    Comment: Performed at [facility], [place] [address], [place], [place] [address]  Magnesium     Status: None   Collection Time: 04/05/20  3:43 AM  Result Value Ref Range   Magnesium 2.2 1.7 - 2.4 mg/dL    Comment: Performed at [facility], [place] [address] [place], [place] [address]  Hemoglobin A1c     Status: Abnormal   Collection Time: 04/05/20  3:43 AM  Result Value Ref Range   Hgb A1c MFr Bld 6.2 (H) 4.8 - 5.6 %    Comment: (NOTE) Pre diabetes:          5.7%-6.4%  Diabetes:              >6.4%  Glycemic control for   <7.0% adults with diabetes    Mean Plasma Glucose 131.24 mg/dL    Comment: Performed at [facility] [address], [place], [place] [address]  Basic metabolic panel     Status: Abnormal   Collection Time: 04/06/20  5:28 AM  Result Value Ref Range   Sodium 135 135 - 145 mmol/L   Potassium 3.9 3.5 - 5.1 mmol/L    Comment: DELTA CHECK NOTED NO VISIBLE HEMOLYSIS    Chloride 99 98 - 111 mmol/L   CO2 23 22 -  32 mmol/L   Glucose, Bld 222 (H) 70 - 99 mg/dL    Comment: Glucose reference range applies only to samples taken after fasting for at least 8 hours.   BUN 25 (H) 6 - 20 mg/dL   Creatinine, Ser 1.04 (H) 0.44 - 1.00 mg/dL   Calcium 9.2 8.9 - 10.3 mg/dL   GFR calc non Af Amer >60 >60 mL/min   GFR calc Af Amer >60 >60 mL/min   Anion gap 13 5 - 15    Comment: Performed at [facility], [place] [address], [place], [place] [address]  CBC with Differential/Platelet     Status: Abnormal   Collection Time: 04/06/20  5:28 AM  Result Value  Ref Range   WBC 12.3 (H) 4.0 - 10.5 K/uL   RBC 3.62 (L) 3.87 - 5.11 MIL/uL   Hemoglobin 10.6 (L) 12.0 - 15.0 g/dL   HCT 34.3 (L) 36 - 46 %   MCV 94.8 80.0 - 100.0 fL   MCH 29.3 26.0 - 34.0 pg   MCHC 30.9 30.0 - 36.0 g/dL   RDW 15.3 11.5 - 15.5 %   Platelets 304 150 - 400 K/uL   nRBC 0.0 0.0 - 0.2 %   Neutrophils Relative % 82 %   Neutro Abs 10.0 (H) 1.7 - 7.7 K/uL   Lymphocytes Relative 11 %   Lymphs Abs 1.4 0.7 - 4.0 K/uL   Monocytes Relative 6 %   Monocytes Absolute 0.7 0 - 1 K/uL   Eosinophils Relative 0 %   Eosinophils Absolute 0.0 0 - 0 K/uL   Basophils Relative 0 %   Basophils Absolute 0.1 0 - 0 K/uL   Immature Granulocytes 1 %   Abs Immature Granulocytes 0.12 (H) 0.00 - 0.07 K/uL    Comment: Performed at [facility], [place] [address] [place], [place] [address]  Magnesium     Status: None   Collection Time: 04/06/20  5:28 AM  Result Value Ref Range   Magnesium 2.3 1.7 - 2.4 mg/dL    Comment: Performed at [facility], [place] [address], [place], [place] [address]    MICRO: 7/2 sphingomonas - sensitivities pending IMAGING: No results found.  HISTORICAL MICRO/IMAGING  Assessment/Plan:   sphingomonas bacteremia = often seen with immunocompromised patients. Has been associated with portacath but can consider treating without removal. Plan to treat with 14 days of iv abtx, will increase ceftriaxone to 2gm iv daily.  At this time we can try to keep portacath in place since it does not appear that we have treatment failure, though she does have an increase in her WBC from 10 to 12K.  If her repeat blood cx are +, then would recommend for removal, with line holiday  Repeat blood cx roughly 4 days after completion of abtx from portacath site to see if still colonized  Throat ulcers. = will give magic mouthwash. Evaluate daily to see if appears to be more consistent with HSV;there is not a lesion that is easily accessible to  swab  Arthralgias/myalgias = could be due to hypokalemia. Recommend to continue to check BMP, correct electrolyte abn and see if she is better.  Cellulitis = appears improved. Could be seen with sphingomonas species. Will improve with ceftriaxone as well.

## 2020-04-06 NOTE — Progress Notes (Signed)
PROGRESS NOTE  [name] KNL:[id number] DOB: [birth year] DOA: 04/01/2020 PCP: Su Grand, MD   LOS: 3 days   Brief Narrative / Interim history: 49 year old female with history of multiple skin cancers, breast cancer, Hodgkin's lymphoma, thyroid cancer, cervical cancer, pituitary tumor status post resection, hypothyroidism, vasculitis, adrenal insufficiency, chronic hypoxic respiratory failure, right heart failure, essential hypertension came into the hospital for left arm swelling and redness.  She received a presentation last month, she was treated with IV Lasix and antibiotics and discharged on a course of cefdinir and oral Lasix.  She started developing skin lesion from her vasculitis few days ago, the surrounding skin became red and swollen and was placed empirically on Bactrim.  She was also febrile to 102 at home.  She was eventually sent to the ED feeling like she failed oral antibiotics and was admitted to the hospital.  Blood cultures also showed GNRs.  Subjective / 24h Interval events: She is feeling the worst that she has been recently. Feels like her legs and hand are more swollen. Reports occasional coughing spells. Had 2 more episodes of hemoptysis last night.  Also feels very hot right now and feels like there is 90 degrees is in the room  Assessment & Plan: Principal Problem Recurrent cellulitis/immunocompromised/GNR bacteremia -patient is admitted to the hospital with worsening of the left arm swelling and fever of 102 at home.  She has a history of recurrent left upper extremity cellulitis.  She is afebrile in the hospital however her white count has increased over the last several days and clinically she is feeling worse.  She had ultrasound with no evidence of DVT.  She has been initially placed on broad-spectrum antibiotics but transition to ceftriaxone once blood culture speciated GNR's.  Active Problems Sphingomonas species in blood culture -blood cultures on admission  showed sphingomonas species, however I not sure whether that is because all I see is GNRs. ID consulted today  Intermittent cough spells, epistaxis, hemoptysis, history of asthma -intermittent flareups of coughing spells which makes her wheezing, anxious, hypoxic.  She has had several episodes of epistaxis no hemoptysis no coughing, pulmonary consulted and appreciate input.  CT scan of the chest on 7/5 without significant lower airway disease.  Continue steroids, Tessalon Perles and supportive management  Chronic hypoxic respiratory failure-on 3-4 L oxygen at home, continue home medications.  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  Will use IV steroids for now and DC the orals  Steroid-induced fluid retention, history of chronic CHF with reduced EF-continue IV Lasix, she is on 40 mg twice daily.  Creatinine stable.  Echo done in May 2021 shows EF of 65-70%, normal LV function, normal RV function, moderate tricuspid regurg and PA pressure mildly elevated at 35  Hypokalemia-replete as indicated while on Lasix  Anxiety disorder-definitely plays a role in her perception of her health, she has a complex medical history but does seem to focus on a lot of daily details regarding WBC, RBC and so 1.  Continue Klonopin, psychiatry has been consulted by Dr. Pietro Cassis  Hypothyroidism-continue Synthroid  Stage IV Hodgkin's disease-at age 65, status post chemotherapy and mantle field radiotherapy  Breast cancer/DCIS status post bilateral mastectomy-outpatient follow-up  Chronic pain, gout, hypertension, migraine-chronic problems, continue home regimen as below  Scheduled Meds: . aspirin  325 mg Oral QHS  . beclomethasone  1 puff Inhalation BID  . Chlorhexidine Gluconate Cloth  6 each Topical Daily  . clonazePAM  1 mg Oral BID  . colchicine  0.6 mg Oral Q1500  . dexlansoprazole  60 mg Oral  Daily  . escitalopram  20 mg Oral QPM  . fluticasone furoate-vilanterol  1 puff Inhalation Daily  . furosemide  40 mg Intravenous BID BM  . hydrocortisone sod succinate (SOLU-CORTEF) inj  50 mg Intravenous Q12H  . levothyroxine  112 mcg Oral Q0600  . metoprolol succinate  37.5 mg Oral Daily  . rosuvastatin  10 mg Oral Daily  . topiramate  150 mg Oral QHS  . zolpidem  10 mg Oral QHS   Continuous Infusions: . cefTRIAXone (ROCEPHIN)  IV 1 g (04/05/20 1726)   PRN Meds:.albuterol, benzonatate, diphenhydrAMINE, dronabinol, HYDROmorphone (DILAUDID) injection, LORazepam, oxyCODONE-acetaminophen, Rimegepant Sulfate, sodium chloride flush, sucralfate  Diet Orders (From admission, onward)    Start     Ordered   04/01/20 2207  Diet regular Room service appropriate? Yes; Fluid consistency: Thin  Diet effective now       Question Answer Comment  Room service appropriate? Yes   Fluid consistency: Thin      04/01/20 2206          DVT prophylaxis: Place and maintain sequential compression device Start: 04/04/20 1336     Code Status: DNR  Family Communication: no family at bedisde  Status is: Inpatient  Remains inpatient appropriate because:needs iv diuresis   Dispo: The patient is from: Home              Anticipated d/c is to: Home              Anticipated d/c date is: 3 days              Patient currently is not medically stable to d/c.   Consultants:  Pulmonary  ID  Procedures:  none  Microbiology  Blood cultures - GNRs Blood cultures 7/6 - NGTD  Antimicrobials: Ceftriaxone    Objective: Vitals:   04/05/20 2154 04/06/20 0500 04/06/20 0543 04/06/20 0842  BP: 125/88  136/85   Pulse: 89  90   Resp: 20  (!) 22   Temp: 97.8 F (36.6 C)  98 F (36.7 C)   TempSrc:      SpO2: 100%  98% 99%  Weight:  91.1 kg    Height:        Intake/Output Summary (Last 24 hours) at 04/06/2020 0906 Last data filed at 04/06/2020 0720 Gross per 24 hour  Intake 1240 ml  Output 3900  ml  Net -2660 ml   Filed Weights   04/01/20 1456 04/06/20 0500  Weight: 89.8 kg 91.1 kg    Examination:  Constitutional: NAD Eyes: no scleral icterus ENMT: Mucous membranes are moist.  Neck: normal, supple Respiratory: Coarse at the bases with couple coughing spells Cardiovascular: Regular rate and rhythm, no murmurs / rubs / gallops.  1+ edema mainly at feet Abdomen: non distended, no tenderness. Bowel sounds positive.  Musculoskeletal: no clubbing / cyanosis.  Skin: no rashes Neurologic: non focal   Data Reviewed: I have independently reviewed following labs and imaging studies   CBC: Recent Labs  Lab 04/01/20 1517 04/04/20 0314 04/05/20 0343 04/06/20 0528  WBC 9.3 9.4 10.1 12.3*  NEUTROABS 6.9 5.3 6.1 10.0*  HGB 10.8* 10.2* 10.4* 10.6*  HCT 33.9* 32.6* 32.6* 34.3*  MCV 93.4 94.2 93.9 94.8  PLT 287 278 277 732   Basic Metabolic Panel: Recent Labs  Lab 04/01/20 1517 04/01/20 1517  04/02/20 0408 04/03/20 0330 04/04/20 0314 04/05/20 0343 04/06/20 0528  NA 140  --  136  --  136 138 135  K 3.3*  --  3.5  --  3.2* 2.9* 3.9  CL 100  --  98  --  95* 94* 99  CO2 25  --  27  --  32 31 23  GLUCOSE 109*  --  178*  --  130* 160* 222*  BUN 19  --  20  --  23* 26* 25*  CREATININE 1.06*   < > 1.14* 1.12* 1.15* 1.22* 1.04*  CALCIUM 8.3*  --  8.0*  --  8.6* 8.9 9.2  MG 2.0  --   --   --   --  2.2 2.3   < > = values in this interval not displayed.   Liver Function Tests: Recent Labs  Lab 04/01/20 1517  AST 29  ALT 38  ALKPHOS 56  BILITOT 0.5  PROT 7.3  ALBUMIN 3.9   Coagulation Profile: No results for input(s): INR, PROTIME in the last 168 hours. HbA1C: Recent Labs    04/05/20 0343  HGBA1C 6.2*   CBG: No results for input(s): GLUCAP in the last 168 hours.  Recent Results (from the past 240 hour(s))  Culture, blood (single)     Status: None (Preliminary result)   Collection Time: 04/01/20  3:49 PM   Specimen: BLOOD  Result Value Ref Range Status    Specimen Description   Final    BLOOD PORTA CATH Performed at [facility] [address] [place], [place] [address]    Special Requests   Final    BOTTLES DRAWN AEROBIC AND ANAEROBIC Blood Culture adequate volume Performed at [facility] [address] [place], [place] [address]    Culture  Setup Time   Final    GRAM NEGATIVE RODS AEROBIC BOTTLE ONLY CRITICAL RESULT CALLED TO, READ BACK BY AND VERIFIED WITH: J. GRIMSLEY,PHARMD 2342 04/03/2020 T. TYSOR    Culture   Final    GRAM NEGATIVE RODS IDENTIFICATION AND SUSCEPTIBILITIES TO FOLLOW Performed at [facility], [place] [address] [place], [place] [address]    Report Status PENDING  Incomplete  Blood Culture ID Panel (Reflexed)     Status: None   Collection Time: 04/01/20  3:49 PM  Result Value Ref Range Status   Enterococcus species NOT DETECTED NOT DETECTED Final   Listeria monocytogenes NOT DETECTED NOT DETECTED Final   Staphylococcus species NOT DETECTED NOT DETECTED Final   Staphylococcus aureus (BCID) NOT DETECTED NOT DETECTED Final   Streptococcus species NOT DETECTED NOT DETECTED Final   Streptococcus agalactiae NOT DETECTED NOT DETECTED Final   Streptococcus pneumoniae NOT DETECTED NOT DETECTED Final   Streptococcus pyogenes NOT DETECTED NOT DETECTED Final   Acinetobacter baumannii NOT DETECTED NOT DETECTED Final   Enterobacteriaceae species NOT DETECTED NOT DETECTED Final   Enterobacter cloacae complex NOT DETECTED NOT DETECTED Final   Escherichia coli NOT DETECTED NOT DETECTED Final   Klebsiella oxytoca NOT DETECTED NOT DETECTED Final   Klebsiella pneumoniae NOT DETECTED NOT DETECTED Final   Proteus species NOT DETECTED NOT DETECTED Final   Serratia marcescens NOT DETECTED NOT DETECTED Final   Haemophilus influenzae NOT DETECTED NOT DETECTED Final   Neisseria meningitidis NOT DETECTED NOT DETECTED Final   Pseudomonas aeruginosa NOT DETECTED NOT DETECTED Final    Candida albicans NOT DETECTED NOT DETECTED Final   Candida glabrata NOT DETECTED NOT DETECTED Final  Candida krusei NOT DETECTED NOT DETECTED Final   Candida parapsilosis NOT DETECTED NOT DETECTED Final   Candida tropicalis NOT DETECTED NOT DETECTED Final    Comment: Performed at [facility], [place] [address] [place] [address], [place], [place] [address]  SARS Coronavirus 2 by RT PCR (hospital order, performed in [facility] hospital lab) Nasopharyngeal Nasopharyngeal Swab     Status: None   Collection Time: 04/01/20  6:31 PM   Specimen: Nasopharyngeal Swab  Result Value Ref Range Status   SARS Coronavirus 2 NEGATIVE NEGATIVE Final    Comment: (NOTE) SARS-CoV-2 target nucleic acids are NOT DETECTED.  The SARS-CoV-2 RNA is generally detectable in upper and lower respiratory specimens during the acute phase of infection. The lowest concentration of SARS-CoV-2 viral copies this assay can detect is 250 copies / mL. A negative result does not preclude SARS-CoV-2 infection and should not be used as the sole basis for treatment or other patient management decisions.  A negative result may occur with improper specimen collection / handling, submission of specimen other than nasopharyngeal swab, presence of viral mutation(s) within the areas targeted by this assay, and inadequate number of viral copies (<250 copies / mL). A negative result must be combined with clinical observations, patient history, and epidemiological information.  Fact Sheet for Patients:   StrictlyIdeas.no  Fact Sheet for Healthcare Providers: BankingDealers.co.za  This test is not yet approved or  cleared by the [place] FDA and has been authorized for detection and/or diagnosis of SARS-CoV-2 by FDA under an Emergency Use Authorization (EUA).  This EUA will remain in effect (meaning this test can be used) for the duration of the COVID-19 declaration under Section 564(b)(1) of the  Act, 21 U.S.C. section 360bbb-3(b)(1), unless the authorization is terminated or revoked sooner.  Performed at [facility], [place] [address], [place], [place] [address]      Radiology Studies: No results found.  Marzetta Board, MD, PhD [facility]  Between 7 am - 7 pm I am available, please contact me via Amion or Securechat  Between 7 pm - 7 am I am not available, please contact night coverage MD/APP via Amion

## 2020-04-07 DIAGNOSIS — E271 Primary adrenocortical insufficiency: Secondary | ICD-10-CM

## 2020-04-07 LAB — CBC
HCT: 32.6 % — ABNORMAL LOW (ref 36.0–46.0)
Hemoglobin: 10.1 g/dL — ABNORMAL LOW (ref 12.0–15.0)
MCH: 29.8 pg (ref 26.0–34.0)
MCHC: 31 g/dL (ref 30.0–36.0)
MCV: 96.2 fL (ref 80.0–100.0)
Platelets: 316 10*3/uL (ref 150–400)
RBC: 3.39 MIL/uL — ABNORMAL LOW (ref 3.87–5.11)
RDW: 15.7 % — ABNORMAL HIGH (ref 11.5–15.5)
WBC: 10.9 10*3/uL — ABNORMAL HIGH (ref 4.0–10.5)
nRBC: 0 % (ref 0.0–0.2)

## 2020-04-07 LAB — COMPREHENSIVE METABOLIC PANEL
ALT: 35 U/L (ref 0–44)
AST: 25 U/L (ref 15–41)
Albumin: 3.6 g/dL (ref 3.5–5.0)
Alkaline Phosphatase: 50 U/L (ref 38–126)
Anion gap: 14 (ref 5–15)
BUN: 26 mg/dL — ABNORMAL HIGH (ref 6–20)
CO2: 26 mmol/L (ref 22–32)
Calcium: 8.9 mg/dL (ref 8.9–10.3)
Chloride: 100 mmol/L (ref 98–111)
Creatinine, Ser: 1.08 mg/dL — ABNORMAL HIGH (ref 0.44–1.00)
GFR calc Af Amer: >60 mL/min (ref >60)
GFR calc non Af Amer: >60 mL/min (ref >60)
Glucose, Bld: 166 mg/dL — ABNORMAL HIGH (ref 70–99)
Potassium: 3.6 mmol/L (ref 3.5–5.1)
Sodium: 140 mmol/L (ref 135–145)
Total Bilirubin: 0.3 mg/dL (ref 0.3–1.2)
Total Protein: 6.8 g/dL (ref 6.5–8.1)

## 2020-04-07 LAB — MAGNESIUM: Magnesium: 2.1 mg/dL (ref 1.7–2.4)

## 2020-04-07 LAB — PHOSPHORUS: Phosphorus: 3.7 mg/dL (ref 2.5–4.6)

## 2020-04-07 MED ORDER — FLUCONAZOLE 150 MG PO TABS
150.0000 mg | ORAL_TABLET | Freq: Once | ORAL | Status: AC
  Administered 2020-04-07: 150 mg via ORAL
  Filled 2020-04-07: qty 1

## 2020-04-07 MED ORDER — POTASSIUM CHLORIDE CRYS ER 20 MEQ PO TBCR
40.0000 meq | EXTENDED_RELEASE_TABLET | Freq: Once | ORAL | Status: AC
  Administered 2020-04-07: 40 meq via ORAL
  Filled 2020-04-07: qty 2

## 2020-04-07 MED ORDER — NYSTATIN 100000 UNIT/ML MT SUSP
5.0000 mL | Freq: Four times a day (QID) | OROMUCOSAL | Status: DC
  Administered 2020-04-07: 500000 [IU] via ORAL
  Filled 2020-04-07 (×2): qty 5

## 2020-04-07 NOTE — TOC Initial Note (Signed)
Transition of Care [facility]) - Initial/Assessment Note    Patient Details  Name: [name] MRN: [id number] Date of Birth: [birth year]  Transition of Care [facility]) CM/SW Contact:    Lia Hopping, LCSW Phone Number: 04/07/2020, 4:00 PM  Clinical Narrative:  [facility] staff following for Home IV antibiotics. CSW met with the patient at beside, introduced my role and reason for the visit.Patient agreeable to Home Health RN.                  Carolynn Sayers w/[facility] infusion is aware and will provide further instruction to the patient. HELMS to provide HHRN.   Expected Discharge Plan: [place] Barriers to Discharge: Continued Medical Work up   Patient Goals and CMS Choice Patient states their goals for this hospitalization and ongoing recovery are:: return home with follow up care      Expected Discharge Plan and Services Expected Discharge Plan: [place] In-house Referral: Clinical Social Work Discharge Planning Services: CM Consult Post Acute Care Choice: [place] arrangements for the past 2 months: Single Family Home                 DME Arranged: N/A DME Agency: [place]: RN [place] Agency: Other - See comment     Representative spoke with at [place]: Pam Chandler/HELMS  Prior Living Arrangements/Services Living arrangements for the past 2 months: Single Family Home Lives with:: Self Patient language and need for interpreter reviewed:: No Do you feel safe going back to the place where you live?: Yes      Need for Family Participation in Patient Care: Yes (Comment) Care giver support system in place?: Yes (comment) Current home services: DME Criminal Activity/Legal Involvement Pertinent to Current Situation/Hospitalization: No - Comment as needed  Activities of Daily Living Home Assistive Devices/Equipment: Oxygen ADL Screening (condition at time of admission) Patient's cognitive ability adequate to  safely complete daily activities?: Yes Is the patient deaf or have difficulty hearing?: No Does the patient have difficulty seeing, even when wearing glasses/contacts?: No Does the patient have difficulty concentrating, remembering, or making decisions?: No Patient able to express need for assistance with ADLs?: Yes Does the patient have difficulty dressing or bathing?: No Independently performs ADLs?: Yes (appropriate for developmental age) Does the patient have difficulty walking or climbing stairs?: No Weakness of Legs: None Weakness of Arms/Hands: None  Permission Sought/Granted Permission sought to share information with : Case Manager Permission granted to share information with : Yes, Verbal Permission Granted     Permission granted to share info w AGENCY: Home Health Agency        Emotional Assessment Appearance:: Appears stated age Attitude/Demeanor/Rapport: Engaged Affect (typically observed): Accepting, Pleasant Orientation: : Oriented to Self, Oriented to Place, Oriented to  Time, Oriented to Situation Alcohol / Substance Use: Not Applicable Psych Involvement: No (comment)  Admission diagnosis:  Addison's disease ([place]) [E27.1] Nausea [R11.0] Arm edema [R60.0] Intractable nausea and vomiting [R11.2] Hypervolemia, unspecified hypervolemia type [E87.70] Patient Active Problem List   Diagnosis Date Noted  . Addison's disease ([place])   . Nausea 04/01/2020  . Generalized weakness 02/10/2020  . Headache 02/10/2020  . Subcutaneous nodule 02/01/2020  . Port-A-Cath in place 09/09/2019  . Moderate persistent asthma without complication 55/37/4827  . Acute superficial venous thrombosis of lower extremity, right 07/16/2019  . Injury of left index finger 12/24/2018  . Pituitary adenoma ([place]) 12/23/2018  .  History of cervical cancer 12/23/2018  . Chronic respiratory failure with hypoxia ([place]) 12/23/2018  . SOB (shortness of breath)   . Nephrolithiasis 11/07/2018  . Oxygen  dependent 11/07/2018  . Migraine 11/07/2018  . PVC's (premature ventricular contractions) 10/27/2018  . Hyperlipidemia 03/09/2014  . Hx of Hodgkins lymphoma   . History of ductal carcinoma in situ (DCIS) of breast   . Thyroid cancer ([place])   . Adrenal insufficiency ([place])   . Hypertension   . Raynaud phenomenon   . GERD (gastroesophageal reflux disease)    PCP:  Su Grand, MD Pharmacy:   [id number] [place] [id number]: [phone]: [phone]    Social Determinants of Health (SDOH) Interventions    Readmission Risk Interventions No flowsheet data found.

## 2020-04-07 NOTE — Plan of Care (Signed)

## 2020-04-07 NOTE — Progress Notes (Signed)
PROGRESS NOTE  [name] BDZ:[id number] DOB: [birth year] DOA: 04/01/2020 PCP: Su Grand, MD   LOS: 4 days   Brief Narrative / Interim history: 49 year old female with history of multiple skin cancers, breast cancer, Hodgkin's lymphoma, thyroid cancer, cervical cancer, pituitary tumor status post resection, hypothyroidism, vasculitis, adrenal insufficiency, chronic hypoxic respiratory failure, right heart failure, essential hypertension came into the hospital for left arm swelling and redness.  She received a presentation last month, she was treated with IV Lasix and antibiotics and discharged on a course of cefdinir and oral Lasix.  She started developing skin lesion from her vasculitis few days ago, the surrounding skin became red and swollen and was placed empirically on Bactrim.  She was also febrile to 102 at home.  She was eventually sent to the ED feeling like she failed oral antibiotics and was admitted to the hospital.  Blood cultures also showed GNRs.  Subjective / 24h Interval events: She had couple more coughing spells with hemoptysis last night.  Not feeling too great this morning.  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  She has a history of recurrent left upper extremity cellulitis.  She was afebrile in the hospital however her white count has been on the high side.  She had ultrasound without evidence of DVT.  Infectious disease was consulted and currently recommending 14 days of IV ceftriaxone, 2 g/day.  Surveillance cultures obtained on 7/7 still without growth to date.  If they turn positive patient will need removal of the port and a line holiday  Active Problems Intermittent cough  spells, epistaxis, hemoptysis, history of asthma -intermittent flareups of coughing spells which makes her wheezing, anxious, hypoxic.  She has had several episodes of epistaxis no hemoptysis no coughing, pulmonary consulted and appreciate input.  CT scan of the chest on 7/5 without significant lower airway disease.  Continue steroids, Tessalon Perles and supportive management.  Pulmonary evaluated patient  Chronic hypoxic respiratory failure-on 3-4 L oxygen at home, continue home medications.  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  Continue IV steroids  Steroid-induced fluid retention, history of chronic CHF with reduced EF-continue IV Lasix, she is on 40 mg twice daily.  Creatinine stable.  Echo done in May 2021 shows EF of 65-70%, normal LV function, normal RV function, moderate tricuspid regurg and PA pressure mildly elevated at 35.  Net -16 L, continue diuresis as clinically she appears fluid overloaded still  Hypokalemia-replete as indicated while on Lasix, continue another dose today  Anxiety disorder-definitely plays a role in her perception of her health, she has a complex medical history but does seem to focus on a lot of daily details regarding WBC, RBC and so on.  Continue Klonopin, psychiatry evaluated patient.  Hypothyroidism-continue Synthroid  Stage IV Hodgkin's disease-at age 64, status post chemotherapy and mantle field radiotherapy  Breast cancer/DCIS status post bilateral mastectomy-outpatient follow-up  Chronic pain, gout, hypertension, migraine-chronic problems, continue home regimen as below  Scheduled Meds: . aspirin  325 mg Oral QHS  . beclomethasone  1 puff Inhalation BID  . Chlorhexidine Gluconate Cloth  6 each Topical Daily  . clonazePAM  1 mg Oral  BID  . colchicine  0.6 mg Oral Q1500  . dexlansoprazole  60 mg Oral Daily  . escitalopram  20 mg  Oral QPM  . fluticasone furoate-vilanterol  1 puff Inhalation Daily  . furosemide  40 mg Intravenous BID BM  . hydrocortisone sod succinate (SOLU-CORTEF) inj  50 mg Intravenous Q12H  . levothyroxine  112 mcg Oral Q0600  . metoprolol succinate  37.5 mg Oral Daily  . rosuvastatin  10 mg Oral Daily  . topiramate  150 mg Oral QHS  . zolpidem  10 mg Oral QHS   Continuous Infusions: . cefTRIAXone (ROCEPHIN)  IV Stopped (04/06/20 1814)   PRN Meds:.albuterol, benzonatate, diphenhydrAMINE, dronabinol, HYDROmorphone (DILAUDID) injection, LORazepam, magic mouthwash w/lidocaine, oxyCODONE-acetaminophen, Rimegepant Sulfate, sodium chloride flush, sucralfate  Diet Orders (From admission, onward)    Start     Ordered   04/01/20 2207  Diet regular Room service appropriate? Yes; Fluid consistency: Thin  Diet effective now       Question Answer Comment  Room service appropriate? Yes   Fluid consistency: Thin      04/01/20 2206          DVT prophylaxis: Place and maintain sequential compression device Start: 04/04/20 1336     Code Status: DNR  Family Communication: no family at bedisde  Status is: Inpatient  Remains inpatient appropriate because:needs iv diuresis, blood culture surveillance   Dispo: The patient is from: Home              Anticipated d/c is to: Home              Anticipated d/c date is: 3 days              Patient currently is not medically stable to d/c.   Consultants:  Pulmonary  ID  Procedures:  none  Microbiology  Blood cultures - GNRs Blood cultures 7/6 - NGTD  Antimicrobials: Ceftriaxone    Objective: Vitals:   04/06/20 2037 04/06/20 2113 04/07/20 0601 04/07/20 1058  BP:  106/75 116/74   Pulse:  84 82   Resp:   16   Temp:  98 F (36.7 C) 98.2 F (36.8 C)   TempSrc:   Oral   SpO2: 99% 99% 100% 99%  Weight:      Height:        Intake/Output Summary (Last 24 hours) at 04/07/2020 1108 Last data filed at 04/07/2020 1029 Gross per 24 hour    Intake 1420.07 ml  Output 4750 ml  Net -3329.93 ml   Filed Weights   04/01/20 1456 04/06/20 0500  Weight: 89.8 kg 91.1 kg    Examination:  Constitutional: No distress Eyes: No scleral icterus ENMT: Moist external drains Neck: normal, supple Respiratory: Clear to auscultation, able to take a deep breath, no wheezing, no crackles Cardiovascular: Regular rate and rhythm, 3/6 SEM, trace edema lower extremities and hands Abdomen: Bowel sounds positive Musculoskeletal: no clubbing / cyanosis.  Skin: No rashes appreciated today Neurologic: Grossly without focal deficits  Data Reviewed: I have independently reviewed following labs and imaging studies   CBC: Recent Labs  Lab 04/01/20 1517 04/04/20 0314 04/05/20 0343 04/06/20 0528 04/07/20 0357  WBC 9.3 9.4 10.1 12.3* 10.9*  NEUTROABS 6.9 5.3 6.1 10.0*  --   HGB 10.8* 10.2* 10.4* 10.6* 10.1*  HCT 33.9* 32.6* 32.6* 34.3* 32.6*  MCV 93.4 94.2 93.9 94.8 96.2  PLT 287 278 277 304 606   Basic Metabolic Panel: Recent Labs  Lab 04/01/20 1517 04/01/20  1517 04/02/20 0408 04/02/20 0408 04/03/20 0330 04/04/20 0314 04/05/20 0343 04/06/20 0528 04/07/20 0357  NA 140   < > 136  --   --  136 138 135 140  K 3.3*   < > 3.5  --   --  3.2* 2.9* 3.9 3.6  CL 100   < > 98  --   --  95* 94* 99 100  CO2 25   < > 27  --   --  32 _0 GLUCOSE 109*   < > 178*  --   --  130* 160* 222* 166*  BUN 19   < > 20  --   --  23* 26* 25* 26*  CREATININE 1.06*   < > 1.14*   < > 1.12* 1.15* 1.22* 1.04* 1.08*  CALCIUM 8.3*   < > 8.0*  --   --  8.6* 8.9 9.2 8.9  MG 2.0  --   --   --   --   --  2.2 2.3 2.1  PHOS  --   --   --   --   --   --   --   --  3.7   < > = values in this interval not displayed.   Liver Function Tests: Recent Labs  Lab 04/01/20 1517 04/07/20 0357  AST 29 25  ALT 38 35  ALKPHOS 56 50  BILITOT 0.5 0.3  PROT 7.3 6.8  ALBUMIN 3.9 3.6   Coagulation Profile: No results for input(s): INR, PROTIME in the last 168  hours. HbA1C: Recent Labs    04/05/20 0343  HGBA1C 6.2*   CBG: No results for input(s): GLUCAP in the last 168 hours.  Recent Results (from the past 240 hour(s))  Culture, blood (single)     Status: Abnormal (Preliminary result)   Collection Time: 04/01/20  3:49 PM   Specimen: BLOOD  Result Value Ref Range Status   Specimen Description   Final    BLOOD PORTA CATH Performed at [facility] [address] [place], [place] [address]    Special Requests   Final    BOTTLES DRAWN AEROBIC AND ANAEROBIC Blood Culture adequate volume Performed at [facility] [address] [place], [place] [address]    Culture  Setup Time   Final    GRAM NEGATIVE RODS AEROBIC BOTTLE ONLY CRITICAL RESULT CALLED TO, READ BACK BY AND VERIFIED WITH: J. GRIMSLEY,PHARMD 2342 04/03/2020 T. TYSOR    Culture (A)  Final    SPHINGOMONAS PAUCIMOBILIS Sent to [place] for further susceptibility testing. Performed at [facility], [place] [address], [place], [place] [address]    Report Status PENDING  Incomplete  Blood Culture ID Panel (Reflexed)     Status: None   Collection Time: 04/01/20  3:49 PM  Result Value Ref Range Status   Enterococcus species NOT DETECTED NOT DETECTED Final   Listeria monocytogenes NOT DETECTED NOT DETECTED Final   Staphylococcus species NOT DETECTED NOT DETECTED Final   Staphylococcus aureus (BCID) NOT DETECTED NOT DETECTED Final   Streptococcus species NOT DETECTED NOT DETECTED Final   Streptococcus agalactiae NOT DETECTED NOT DETECTED Final   Streptococcus pneumoniae NOT DETECTED NOT DETECTED Final   Streptococcus pyogenes NOT DETECTED NOT DETECTED Final   Acinetobacter baumannii NOT DETECTED NOT DETECTED Final   Enterobacteriaceae species NOT DETECTED NOT DETECTED Final   Enterobacter cloacae complex NOT DETECTED NOT DETECTED Final   Escherichia coli NOT DETECTED NOT  DETECTED Final   Klebsiella oxytoca NOT DETECTED NOT DETECTED  Final   Klebsiella pneumoniae NOT DETECTED NOT DETECTED Final   Proteus species NOT DETECTED NOT DETECTED Final   Serratia marcescens NOT DETECTED NOT DETECTED Final   Haemophilus influenzae NOT DETECTED NOT DETECTED Final   Neisseria meningitidis NOT DETECTED NOT DETECTED Final   Pseudomonas aeruginosa NOT DETECTED NOT DETECTED Final   Candida albicans NOT DETECTED NOT DETECTED Final   Candida glabrata NOT DETECTED NOT DETECTED Final   Candida krusei NOT DETECTED NOT DETECTED Final   Candida parapsilosis NOT DETECTED NOT DETECTED Final   Candida tropicalis NOT DETECTED NOT DETECTED Final    Comment: Performed at [facility], [place] [address] [place], [place] [address]  SARS Coronavirus 2 by RT PCR (hospital order, performed in [facility] hospital lab) Nasopharyngeal Nasopharyngeal Swab     Status: None   Collection Time: 04/01/20  6:31 PM   Specimen: Nasopharyngeal Swab  Result Value Ref Range Status   SARS Coronavirus 2 NEGATIVE NEGATIVE Final    Comment: (NOTE) SARS-CoV-2 target nucleic acids are NOT DETECTED.  The SARS-CoV-2 RNA is generally detectable in upper and lower respiratory specimens during the acute phase of infection. The lowest concentration of SARS-CoV-2 viral copies this assay can detect is 250 copies / mL. A negative result does not preclude SARS-CoV-2 infection and should not be used as the sole basis for treatment or other patient management decisions.  A negative result may occur with improper specimen collection / handling, submission of specimen other than nasopharyngeal swab, presence of viral mutation(s) within the areas targeted by this assay, and inadequate number of viral copies (<250 copies / mL). A negative result must be combined with clinical observations, patient history, and epidemiological information.  Fact Sheet for Patients:   StrictlyIdeas.no  Fact Sheet for Healthcare  Providers: BankingDealers.co.za  This test is not yet approved or  cleared by the [place] FDA and has been authorized for detection and/or diagnosis of SARS-CoV-2 by FDA under an Emergency Use Authorization (EUA).  This EUA will remain in effect (meaning this test can be used) for the duration of the COVID-19 declaration under Section 564(b)(1) of the Act, 21 U.S.C. section 360bbb-3(b)(1), unless the authorization is terminated or revoked sooner.  Performed at [facility], [place] [address] [place], [place] [address]   Culture, blood (routine x 2)     Status: None (Preliminary result)   Collection Time: 04/06/20  5:28 AM   Specimen: Porta Cath; Blood  Result Value Ref Range Status   Specimen Description   Final    PORTA CATH RT CHEST Performed at [facility], [place] [address] [place], [place] [address]    Special Requests   Final    BOTTLES DRAWN AEROBIC AND ANAEROBIC Blood Culture adequate volume Performed at [facility] [address], [place], [place] [address]    Culture   Final    NO GROWTH 1 DAY Performed at [facility], [place] [address] [place], [place] [address]    Report Status PENDING  Incomplete     Radiology Studies: No results found.  Marzetta Board, MD, PhD [facility]  Between 7 am - 7 pm I am available, please contact me via Amion or Securechat  Between 7 pm - 7 am I am not available, please contact night coverage MD/APP via Amion

## 2020-04-07 NOTE — Progress Notes (Signed)
[name] for Infectious Disease    Date of Admission:  04/01/2020   Total days of antibiotics 6           ID: [name] is a 49 y.o. 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 Still has sores in mouth that are exquisitely tender. Afebrile.  Medications:  . aspirin  325 mg Oral QHS  . beclomethasone  1 puff Inhalation BID  . Chlorhexidine Gluconate Cloth  6 each Topical Daily  . clonazePAM  1 mg Oral BID  . colchicine  0.6 mg Oral Q1500  . dexlansoprazole  60 mg Oral Daily  . escitalopram  20 mg Oral QPM  . fluticasone furoate-vilanterol  1 puff Inhalation Daily  . furosemide  40 mg Intravenous BID BM  . hydrocortisone sod succinate (SOLU-CORTEF) inj  50 mg Intravenous Q12H  . levothyroxine  112 mcg Oral Q0600  . metoprolol succinate  37.5 mg Oral Daily  . nystatin  5 mL Oral QID  . rosuvastatin  10 mg Oral Daily  . topiramate  150 mg Oral QHS  . zolpidem  10 mg Oral QHS    Objective: Vital signs in last 24 hours: Temp:  [98 F (36.7 C)-98.2 F (36.8 C)] 98.2 F (36.8 C) (07/08 0601) Pulse Rate:  [82-84] 82 (07/08 0601) Resp:  [16] 16 (07/08 0601) BP: (106-116)/(74-75) 116/74 (07/08 0601) SpO2:  [99 %-100 %] 99 % (07/08 1058) Physical Exam  Constitutional:  oriented to person, place, and time. appears well-developed and well-nourished. No distress.  HENT: [place]/AT, PERRLA, no scleral icterus Mouth/Throat: Oropharynx is clear and moist. No oropharyngeal exudate. No  erythema Cardiovascular: Normal rate, regular rhythm and normal heart sounds. Exam reveals no gallop and no friction rub.  No murmur heard.  Pulmonary/Chest: Effort normal and breath sounds normal. No respiratory distress.  has no wheezes.  Neck = supple, no nuchal rigidity Abdominal: Soft. Bowel sounds are normal.  exhibits no distension. There is no tenderness.  Lymphadenopathy: no cervical adenopathy. No axillary adenopathy Neurological: alert and oriented to person, place, and time.  Skin: Skin is warm and dry. No rash noted. No erythema.  Ext: pitting edema Psychiatric: a normal mood and affect.  behavior is normal.    Lab Results Recent Labs    04/06/20 0528 04/07/20 0357  WBC 12.3* 10.9*  HGB 10.6* 10.1*  HCT 34.3* 32.6*  NA 135 140  K 3.9 3.6  CL 99 100  CO2 23 26  BUN 25* 26*  CREATININE 1.04* 1.08*   Liver Panel Recent Labs    04/07/20 0357  PROT 6.8  ALBUMIN 3.6  AST 25  ALT 35  ALKPHOS 50  BILITOT 0.3   Sedimentation Rate No results for input(s): ESRSEDRATE in the last 72 hours. C-Reactive Protein No results for input(s): CRP in the last 72 hours.  Microbiology: Reviewed. Studies/Results: No results found.   Assessment/Plan: Oral sores= patient reports deeper in throat. Unable  to visualize on exam. Will try nystatin shwish and swallow to see if improves her symptoms in addition to magic mouthwash  sphingomonas bacteremia = continue on ceftriaxone. Follow up on repeat blood cx to decide if need to pull port.  Leukocytosis = improved from yesterday  [facility] for Infectious Diseases Cell: [phone] Pager: [phone]  04/07/2020, 4:46 PM

## 2020-04-07 NOTE — Progress Notes (Signed)
NAME:  [name], MRN:  [id number], DOB:  [birth year], LOS: 4 ADMISSION DATE:  04/01/2020, CONSULTATION DATE:  7/5 REFERRING MD:  Dahal, CHIEF COMPLAINT:  hemoptysis   Brief History   49 year old female w/ complicated oncological history and cough variant asthma. She is on [place] as out pt for uncontrolled asthma (felt allergic related as eosinophils on bronch 9%). Admitted w/ working dx of recurrent cellulitis/vasculitis w/ one BC growing from port w/ GNR. PCCM asked to eval for new onset of hemoptysis since admission.   Past Medical History  Breast cancer (bilateral mastectomy), Hodgkins lymphoma, thyroid cancer s/p resection, cervical cancer (all of these in "remission"). Also has h/o chest lymphoma. 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  Chronic chemotherapy induced cor pulmonale  Cough variant Asthma (followed by [facility]) Chronic respiratory failure in 3-4 liters;  no significant history of pulmonary fibrosis Last had bronchoscopy by Dr. Ardeen Jourdain 2020 she notes entry of right middle lobe noted be narrowed and very friable but no active bleeding there were thick white secretions present throughout the airway.  She sent cell count, AFB, fungal cytology and RVP from bronchial alveolar lavage these were all negative needle biopsies were sent from the [facility].  There was no active bleeding noted.  She did have 9% eosinophilia noted.  Because of this her chronic respiratory failure was felt possibly due to uncontrolled allergic type asthma Vasculitis   Significant Hospital Events   7/2 admitted with working diagnosis of cellulitis 7/3: Epistaxis  7/4 significant cough, having episodes of hemoptysis.  Consults:  Pulmonary consulted  Procedures:    Significant Diagnostic Tests:  CT chest 7/5 > images independently reviewed> air trapping, no masses, no airway lesion  Micro Data:  7/2 blood >  1/2 gram negative rods  Antimicrobials:  7/3 zosyn >  7/3 vanc > 7/4  Interim history/subjective:  Mild epistaxis Coughing up brown, thick mucus Dyspnea worse with talking   Objective   Blood pressure 116/74, pulse 82, temperature 98.2 F (36.8 C), temperature source Oral, resp. rate 16, height _0  (1.651 m), weight 91.1 kg, SpO2 99 %.        Intake/Output Summary (Last 24 hours) at 04/07/2020 1300 Last data filed at 04/07/2020 1100 Gross per 24 hour  Intake 1420.07 ml  Output 3950 ml  Net -2529.93 ml   Filed Weights   04/01/20 1456 04/06/20 0500  Weight: 89.8 kg 91.1 kg    Examination: General:  Resting comfortably in bed, no respiratory distress HENT: NCAT OP clear PULM: CTA B, normal effort; frequent cough only on exam CV: RRR, no mgr GI: BS+, soft, nontender MSK: normal bulk and tone Neuro: awake, alert, [facility] Problem list     Assessment & Plan:  Hemoptysis> due to epistaxis and mild asthma exacerbation with known friable airways at baseline (has had bronchoscopy for this twice in the last few years), CT chest without new pathology; CT chest consistent with asthma; 7/8 resolving Most of her complaints are related to upper airway cough syndrome and not an asthma exacerbation, she has not had any wheezing on exam for me other than upper airway sounds > no indication for systemic steroids (other than pan hypopit) > Breo/QVar per home regimen > albuterol prn > voice rest encouraged > tessalon as needed for cough > if hemoptysis recurs increase cough suppressive therapy  PCCM available PRN   Best practice:  Per [facility]  Labs   CBC: Recent Labs  Lab 04/01/20 1517 04/04/20 0314 04/05/20 0343 04/06/20 0528 04/07/20 0357  WBC 9.3 9.4 10.1 12.3* 10.9*  NEUTROABS 6.9 5.3 6.1 10.0*  --   HGB 10.8* 10.2* 10.4* 10.6* 10.1*  HCT 33.9* 32.6* 32.6* 34.3* 32.6*  MCV 93.4 94.2 93.9 94.8 96.2  PLT 287 278 277 304 937    Basic Metabolic  Panel: Recent Labs  Lab 04/01/20 1517 04/01/20 1517 04/02/20 0408 04/02/20 0408 04/03/20 0330 04/04/20 0314 04/05/20 0343 04/06/20 0528 04/07/20 0357  NA 140   < > 136  --   --  136 138 135 140  K 3.3*   < > 3.5  --   --  3.2* 2.9* 3.9 3.6  CL 100   < > 98  --   --  95* 94* 99 100  CO2 25   < > 27  --   --  32 _0 GLUCOSE 109*   < > 178*  --   --  130* 160* 222* 166*  BUN 19   < > 20  --   --  23* 26* 25* 26*  CREATININE 1.06*   < > 1.14*   < > 1.12* 1.15* 1.22* 1.04* 1.08*  CALCIUM 8.3*   < > 8.0*  --   --  8.6* 8.9 9.2 8.9  MG 2.0  --   --   --   --   --  2.2 2.3 2.1  PHOS  --   --   --   --   --   --   --   --  3.7   < > = values in this interval not displayed.   GFR: Estimated Creatinine Clearance: 70.2 mL/min (A) (by C-G formula based on SCr of 1.08 mg/dL (H)). Recent Labs  Lab 04/04/20 0314 04/05/20 0343 04/06/20 0528 04/07/20 0357  PROCALCITON <0.10  --   --   --   WBC 9.4 10.1 12.3* 10.9*    Liver Function Tests: Recent Labs  Lab 04/01/20 1517 04/07/20 0357  AST 29 25  ALT 38 35  ALKPHOS 56 50  BILITOT 0.5 0.3  PROT 7.3 6.8  ALBUMIN 3.9 3.6   No results for input(s): LIPASE, AMYLASE in the last 168 hours. No results for input(s): AMMONIA in the last 168 hours.  ABG    Component Value Date/Time   PHART 7.296 (L) 04/02/2018 1343   PCO2ART 40.8 04/02/2018 1343   PO2ART 140.0 (H) 04/02/2018 1343   HCO3 19.9 (L) 04/02/2018 1343   HCO3 20.8 04/02/2018 1343   TCO2 21 (L) 04/02/2018 1343   TCO2 22 04/02/2018 1343   ACIDBASEDEF 6.0 (H) 04/02/2018 1343   ACIDBASEDEF 6.0 (H) 04/02/2018 1343   O2SAT 99.0 04/02/2018 1343   O2SAT 65.0 04/02/2018 1343     Coagulation Profile: No results for input(s): INR, PROTIME in the last 168 hours.  Cardiac Enzymes: No results for input(s): CKTOTAL, CKMB, CKMBINDEX, TROPONINI in the last 168 hours.  HbA1C: Hgb A1c MFr Bld  Date/Time Value Ref Range Status  04/05/2020 03:43 AM 6.2 (H) 4.8 - 5.6 % Final     Comment:    (NOTE) Pre diabetes:          5.7%-6.4%  Diabetes:              >6.4%  Glycemic control for   <7.0% adults with diabetes   03/18/2010 05:40 AM  <5.7 % Final  5.4 (NOTE)                                                                       According to the ADA Clinical Practice Recommendations for 2011, when HbA1c is used as a screening test:   >=6.5%   Diagnostic of Diabetes Mellitus           (if abnormal result  is confirmed)  5.7-6.4%   Increased risk of developing Diabetes Mellitus  References:Diagnosis and Classification of Diabetes Mellitus,Diabetes WVPX,1062,69(SWNIO 1):S62-S69 and Standards of Medical Care in         Diabetes - 2011,Diabetes EVOJ,5009,38  (Suppl 1):S11-S61.    CBG: No results for input(s): GLUCAP in the last 168 hours.   Critical care time: n/a     Roselie Awkward, MD [facility] Pager: [phone] Cell: [phone] If no response, call [phone]

## 2020-04-08 LAB — COMPREHENSIVE METABOLIC PANEL
ALT: 35 U/L (ref 0–44)
AST: 24 U/L (ref 15–41)
Albumin: 3.7 g/dL (ref 3.5–5.0)
Alkaline Phosphatase: 54 U/L (ref 38–126)
Anion gap: 12 (ref 5–15)
BUN: 30 mg/dL — ABNORMAL HIGH (ref 6–20)
CO2: 26 mmol/L (ref 22–32)
Calcium: 8.7 mg/dL — ABNORMAL LOW (ref 8.9–10.3)
Chloride: 101 mmol/L (ref 98–111)
Creatinine, Ser: 0.99 mg/dL (ref 0.44–1.00)
GFR calc Af Amer: >60 mL/min (ref >60)
GFR calc non Af Amer: >60 mL/min (ref >60)
Glucose, Bld: 201 mg/dL — ABNORMAL HIGH (ref 70–99)
Potassium: 3.7 mmol/L (ref 3.5–5.1)
Sodium: 139 mmol/L (ref 135–145)
Total Bilirubin: 0.5 mg/dL (ref 0.3–1.2)
Total Protein: 6.9 g/dL (ref 6.5–8.1)

## 2020-04-08 LAB — CBC
HCT: 32.1 % — ABNORMAL LOW (ref 36.0–46.0)
Hemoglobin: 10 g/dL — ABNORMAL LOW (ref 12.0–15.0)
MCH: 29.6 pg (ref 26.0–34.0)
MCHC: 31.2 g/dL (ref 30.0–36.0)
MCV: 95 fL (ref 80.0–100.0)
Platelets: 320 10*3/uL (ref 150–400)
RBC: 3.38 MIL/uL — ABNORMAL LOW (ref 3.87–5.11)
RDW: 15.8 % — ABNORMAL HIGH (ref 11.5–15.5)
WBC: 9.5 10*3/uL (ref 4.0–10.5)
nRBC: 0 % (ref 0.0–0.2)

## 2020-04-08 LAB — MAGNESIUM: Magnesium: 2.3 mg/dL (ref 1.7–2.4)

## 2020-04-08 MED ORDER — HYDROCORTISONE NA SUCCINATE PF 100 MG IJ SOLR
50.0000 mg | Freq: Two times a day (BID) | INTRAMUSCULAR | Status: AC
  Administered 2020-04-08: 50 mg via INTRAVENOUS
  Filled 2020-04-08: qty 1

## 2020-04-08 MED ORDER — HYDROCORTISONE 20 MG PO TABS
20.0000 mg | ORAL_TABLET | Freq: Every day | ORAL | Status: DC
  Administered 2020-04-09: 20 mg via ORAL
  Filled 2020-04-08 (×2): qty 1

## 2020-04-08 MED ORDER — POTASSIUM CHLORIDE CRYS ER 20 MEQ PO TBCR
40.0000 meq | EXTENDED_RELEASE_TABLET | Freq: Once | ORAL | Status: AC
  Administered 2020-04-08: 40 meq via ORAL
  Filled 2020-04-08: qty 2

## 2020-04-08 MED ORDER — HYDROCORTISONE 20 MG PO TABS
40.0000 mg | ORAL_TABLET | Freq: Every day | ORAL | Status: DC
  Administered 2020-04-09 – 2020-04-10 (×2): 40 mg via ORAL
  Filled 2020-04-08 (×2): qty 2

## 2020-04-08 NOTE — Progress Notes (Signed)
PROGRESS NOTE  [name] ZJI:[id number] DOB: 1971-01-14 DOA: 04/01/2020 PCP: Su Grand, MD   LOS: 5 days   Brief Narrative / Interim history: 49 year old female with history of multiple skin cancers, breast cancer, Hodgkin's lymphoma, thyroid cancer, cervical cancer, pituitary tumor status post resection, hypothyroidism, vasculitis, adrenal insufficiency, chronic hypoxic respiratory failure, right heart failure, essential hypertension came into the hospital for left arm swelling and redness.  She received a presentation last month, she was treated with IV Lasix and antibiotics and discharged on a course of cefdinir and oral Lasix.  She started developing skin lesion from her vasculitis few days ago, the surrounding skin became red and swollen and was placed empirically on Bactrim.  She was also febrile to 102 at home.  She was eventually sent to the ED feeling like she failed oral antibiotics and was admitted to the hospital.  Blood cultures also showed GNRs.  Subjective / 24h Interval events: 1 coughing spell last night with blood in it. Swelling overall feels better. Still short of breath at times.  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  She has a history of recurrent left upper extremity cellulitis.  She was afebrile in the hospital however her white count has been on the high side.  She had ultrasound without evidence of DVT.  Infectious disease was consulted and currently recommending 14 days of IV ceftriaxone, 2 g/day.  Surveillance cultures obtained on 7/7 without growth, continue to monitor at least 48-72 hours per ID  Active Problems Intermittent cough spells, epistaxis, hemoptysis, history of asthma -intermittent flareups of coughing spells which makes her wheezing, anxious, hypoxic.  She has had several  episodes of epistaxis no hemoptysis no coughing, pulmonary consulted and appreciate input.  CT scan of the chest on 7/5 without significant lower airway disease.  Continue steroids, Tessalon Perles and supportive management. Pulmonary evaluated patient, signed off 7/8  Chronic hypoxic respiratory failure-on 3-4 L oxygen at home, continue home medications.  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 Transition IV steroids to higher home dose starting tomorrow  Steroid-induced fluid retention, history of chronic CHF with reduced EF-continue IV Lasix, she is on 40 mg twice daily.  Creatinine stable.  Echo done in May 2021 shows EF of 65-70%, normal LV function, normal RV function, moderate tricuspid regurg and PA pressure mildly elevated at 35. Tolerating diuresis well with stable renal function  Hypokalemia-replete as indicated while on Lasix, continue another dose today  Anxiety disorder-definitely plays a role in her perception of her health, she has a complex medical history but does seem to focus on a lot of daily details regarding WBC, RBC and so on.  Continue Klonopin, psychiatry evaluated patient.  Hypothyroidism-continue Synthroid  Stage IV Hodgkin's disease-at age 56, status post chemotherapy and mantle field radiotherapy  Breast cancer/DCIS status post bilateral mastectomy-outpatient follow-up  Chronic pain, gout, hypertension, migraine-chronic problems, continue home regimen as below  Scheduled Meds: . aspirin  325 mg Oral QHS  . beclomethasone  1 puff Inhalation BID  . Chlorhexidine Gluconate Cloth  6 each Topical Daily  . clonazePAM  1 mg Oral BID  . colchicine  0.6 mg Oral Q1500  . dexlansoprazole  60 mg Oral Daily  . escitalopram  20 mg Oral QPM  . fluticasone  furoate-vilanterol  1 puff Inhalation Daily  . furosemide  40 mg Intravenous BID BM  . hydrocortisone  sod succinate (SOLU-CORTEF) inj  50 mg Intravenous Q12H  . levothyroxine  112 mcg Oral Q0600  . metoprolol succinate  37.5 mg Oral Daily  . rosuvastatin  10 mg Oral Daily  . topiramate  150 mg Oral QHS  . zolpidem  10 mg Oral QHS   Continuous Infusions: . cefTRIAXone (ROCEPHIN)  IV 2 g (04/07/20 1721)   PRN Meds:.albuterol, benzonatate, diphenhydrAMINE, dronabinol, HYDROmorphone (DILAUDID) injection, LORazepam, magic mouthwash w/lidocaine, oxyCODONE-acetaminophen, Rimegepant Sulfate, sodium chloride flush, sucralfate  Diet Orders (From admission, onward)    Start     Ordered   04/01/20 2207  Diet regular Room service appropriate? Yes; Fluid consistency: Thin  Diet effective now       Question Answer Comment  Room service appropriate? Yes   Fluid consistency: Thin      04/01/20 2206          DVT prophylaxis: Place and maintain sequential compression device Start: 04/04/20 1336     Code Status: DNR  Family Communication: no family at bedisde  Status is: Inpatient  Remains inpatient appropriate because:needs iv diuresis, blood culture surveillance   Dispo: The patient is from: Home              Anticipated d/c is to: Home              Anticipated d/c date is: 2 days              Patient currently is not medically stable to d/c.   Consultants:  Pulmonary  ID  Procedures:  none  Microbiology  Blood cultures - GNRs Blood cultures 7/6 - NGTD  Antimicrobials: Ceftriaxone    Objective: Vitals:   04/07/20 2124 04/08/20 0635 04/08/20 1018 04/08/20 1324  BP: 112/72 112/77  124/78  Pulse: 79 82  85  Resp: _0 Temp: 98 F (36.7 C) 97.9 F (36.6 C)  (!) 97.4 F (36.3 C)  TempSrc: Oral Oral  Oral  SpO2: 96% 100% 98% 100%  Weight:      Height:        Intake/Output Summary (Last 24 hours) at 04/08/2020 1335 Last data filed at 04/08/2020 0600 Gross per 24 hour  Intake 820 ml  Output 2900 ml  Net -2080 ml   Filed Weights   04/01/20 1456 04/06/20 0500    Weight: 89.8 kg 91.1 kg    Examination:  Constitutional: No distress, in bed Eyes: No scleral icterus ENMT: Moist mucous membranes Neck: normal, supple Respiratory: Faint bibasilar rhonchi when coughing but overall clear without wheezing or crackles Cardiovascular: Regular rate and rhythm, 3/6 SEM, trace lower extremity edema Abdomen: Nontender, bowel sounds positive Musculoskeletal: no clubbing / cyanosis.  Skin: No rashes appreciated Neurologic: Grossly nonfocal  Data Reviewed: I have independently reviewed following labs and imaging studies   CBC: Recent Labs  Lab 04/01/20 1517 04/01/20 1517 04/04/20 0314 04/05/20 0343 04/06/20 0528 04/07/20 0357 04/08/20 0408  WBC 9.3   < > 9.4 10.1 12.3* 10.9* 9.5  NEUTROABS 6.9  --  5.3 6.1 10.0*  --   --   HGB 10.8*   < > 10.2* 10.4* 10.6* 10.1* 10.0*  HCT 33.9*   < > 32.6* 32.6* 34.3* 32.6* 32.1*  MCV 93.4   < > 94.2 93.9 94.8 96.2 95.0  PLT 287   < > 278 277 304 316 320   < > =  values in this interval not displayed.   Basic Metabolic Panel: Recent Labs  Lab 04/01/20 1517 04/02/20 0408 04/04/20 0314 04/05/20 0343 04/06/20 0528 04/07/20 0357 04/08/20 0408  NA 140   < > 136 138 135 140 139  K 3.3*   < > 3.2* 2.9* 3.9 3.6 3.7  CL 100   < > 95* 94* 99 100 101  CO2 25   < > 32 _0 GLUCOSE 109*   < > 130* 160* 222* 166* 201*  BUN 19   < > 23* 26* 25* 26* 30*  CREATININE 1.06*   < > 1.15* 1.22* 1.04* 1.08* 0.99  CALCIUM 8.3*   < > 8.6* 8.9 9.2 8.9 8.7*  MG 2.0  --   --  2.2 2.3 2.1 2.3  PHOS  --   --   --   --   --  3.7  --    < > = values in this interval not displayed.   Liver Function Tests: Recent Labs  Lab 04/01/20 1517 04/07/20 0357 04/08/20 0408  AST _1 ALT 38 35 35  ALKPHOS 56 50 54  BILITOT 0.5 0.3 0.5  PROT 7.3 6.8 6.9  ALBUMIN 3.9 3.6 3.7   Coagulation Profile: No results for input(s): INR, PROTIME in the last 168 hours. HbA1C: No results for input(s): HGBA1C in the last 72  hours. CBG: No results for input(s): GLUCAP in the last 168 hours.  Recent Results (from the past 240 hour(s))  Culture, blood (single)     Status: Abnormal (Preliminary result)   Collection Time: 04/01/20  3:49 PM   Specimen: BLOOD  Result Value Ref Range Status   Specimen Description   Final    BLOOD PORTA CATH Performed at [facility] [address], [place], [place] [address]    Special Requests   Final    BOTTLES DRAWN AEROBIC AND ANAEROBIC Blood Culture adequate volume Performed at [facility] [address] [place], [place] [address]    Culture  Setup Time   Final    GRAM NEGATIVE RODS AEROBIC BOTTLE ONLY CRITICAL RESULT CALLED TO, READ BACK BY AND VERIFIED WITH: J. GRIMSLEY,PHARMD 2342 04/03/2020 T. TYSOR    Culture (A)  Final    SPHINGOMONAS PAUCIMOBILIS Sent to [facility] for further susceptibility testing. Performed at [facility], [place] [address] [place], [place] [address]    Report Status PENDING  Incomplete  Blood Culture ID Panel (Reflexed)     Status: None   Collection Time: 04/01/20  3:49 PM  Result Value Ref Range Status   Enterococcus species NOT DETECTED NOT DETECTED Final   Listeria monocytogenes NOT DETECTED NOT DETECTED Final   Staphylococcus species NOT DETECTED NOT DETECTED Final   Staphylococcus aureus (BCID) NOT DETECTED NOT DETECTED Final   Streptococcus species NOT DETECTED NOT DETECTED Final   Streptococcus agalactiae NOT DETECTED NOT DETECTED Final   Streptococcus pneumoniae NOT DETECTED NOT DETECTED Final   Streptococcus pyogenes NOT DETECTED NOT DETECTED Final   Acinetobacter baumannii NOT DETECTED NOT DETECTED Final   Enterobacteriaceae species NOT DETECTED NOT DETECTED Final   Enterobacter cloacae complex NOT DETECTED NOT DETECTED Final   Escherichia coli NOT DETECTED NOT DETECTED Final   Klebsiella oxytoca NOT DETECTED NOT DETECTED Final   Klebsiella pneumoniae NOT DETECTED NOT DETECTED  Final   Proteus species NOT DETECTED NOT DETECTED Final   Serratia marcescens NOT DETECTED NOT DETECTED Final  Haemophilus influenzae NOT DETECTED NOT DETECTED Final   Neisseria meningitidis NOT DETECTED NOT DETECTED Final   Pseudomonas aeruginosa NOT DETECTED NOT DETECTED Final   Candida albicans NOT DETECTED NOT DETECTED Final   Candida glabrata NOT DETECTED NOT DETECTED Final   Candida krusei NOT DETECTED NOT DETECTED Final   Candida parapsilosis NOT DETECTED NOT DETECTED Final   Candida tropicalis NOT DETECTED NOT DETECTED Final    Comment: Performed at [facility], [place] [address] [place], [place] [address]  SARS Coronavirus 2 by RT PCR (hospital order, performed in [facility] hospital lab) Nasopharyngeal Nasopharyngeal Swab     Status: None   Collection Time: 04/01/20  6:31 PM   Specimen: Nasopharyngeal Swab  Result Value Ref Range Status   SARS Coronavirus 2 NEGATIVE NEGATIVE Final    Comment: (NOTE) SARS-CoV-2 target nucleic acids are NOT DETECTED.  The SARS-CoV-2 RNA is generally detectable in upper and lower respiratory specimens during the acute phase of infection. The lowest concentration of SARS-CoV-2 viral copies this assay can detect is 250 copies / mL. A negative result does not preclude SARS-CoV-2 infection and should not be used as the sole basis for treatment or other patient management decisions.  A negative result may occur with improper specimen collection / handling, submission of specimen other than nasopharyngeal swab, presence of viral mutation(s) within the areas targeted by this assay, and inadequate number of viral copies (<250 copies / mL). A negative result must be combined with clinical observations, patient history, and epidemiological information.  Fact Sheet for Patients:   StrictlyIdeas.no  Fact Sheet for Healthcare Providers: BankingDealers.co.za  This test is not yet approved or  cleared  by the [place] FDA and has been authorized for detection and/or diagnosis of SARS-CoV-2 by FDA under an Emergency Use Authorization (EUA).  This EUA will remain in effect (meaning this test can be used) for the duration of the COVID-19 declaration under Section 564(b)(1) of the Act, 21 U.S.C. section 360bbb-3(b)(1), unless the authorization is terminated or revoked sooner.  Performed at [facility], [place] [address] [place], [place] [address]   Culture, blood (routine x 2)     Status: None (Preliminary result)   Collection Time: 04/06/20  5:28 AM   Specimen: Porta Cath; Blood  Result Value Ref Range Status   Specimen Description   Final    PORTA CATH RT CHEST Performed at [facility], [place] [address] [place], [place] [address]    Special Requests   Final    BOTTLES DRAWN AEROBIC AND ANAEROBIC Blood Culture adequate volume Performed at [facility] [address], [place], [place] [address]    Culture   Final    NO GROWTH 1 DAY Performed at [facility], [place] [address] [place], [place] [address]    Report Status PENDING  Incomplete     Radiology Studies: No results found.  Marzetta Board, MD, PhD [facility]  Between 7 am - 7 pm I am available, please contact me via Amion or Securechat  Between 7 pm - 7 am I am not available, please contact night coverage MD/APP via Amion

## 2020-04-08 NOTE — Progress Notes (Signed)
[facility]    Date of Admission:  04/01/2020   Total days of antibiotics 7           ID: [name] is a 49 y.o. female with sphingomonas bacteremia Principal Problem:   Nausea Active Problems:   Hx of Hodgkins lymphoma   History of ductal carcinoma in situ (DCIS) of breast   Thyroid cancer ([place])   Adrenal insufficiency (HCC)   Hypertension   Nephrolithiasis   Oxygen dependent   Migraine   Pituitary adenoma ([place])   History of cervical cancer   Chronic respiratory failure with hypoxia (HCC)   Port-A-Cath in place   Generalized weakness   Addison's disease ([place])    Subjective: Doesn't feel much improvement with nystatin swish swallow, but feels magic mouthwash helps with mouth ulcers. Feels fatigued. Swelling to hands improved. Still has edema to lower extremities  ROS: no fever,chills, diarrhea  Medications:  . aspirin  325 mg Oral QHS  . beclomethasone  1 puff Inhalation BID  . Chlorhexidine Gluconate Cloth  6 each Topical Daily  . clonazePAM  1 mg Oral BID  . colchicine  0.6 mg Oral Q1500  . dexlansoprazole  60 mg Oral Daily  . escitalopram  20 mg Oral QPM  . fluticasone furoate-vilanterol  1 puff Inhalation Daily  . furosemide  40 mg Intravenous BID BM  . [START ON 04/09/2020] hydrocortisone  40 mg Oral QAC breakfast   And  . [START ON 04/09/2020] hydrocortisone  20 mg Oral QAC supper  . hydrocortisone sod succinate (SOLU-CORTEF) inj  50 mg Intravenous Q12H  . levothyroxine  112 mcg Oral Q0600  . metoprolol succinate  37.5 mg Oral Daily  . rosuvastatin  10 mg Oral Daily  . topiramate  150 mg Oral QHS  . zolpidem  10 mg Oral QHS    Objective: Vital signs in last 24 hours: Temp:  [97.4 F (36.3 C)-98 F (36.7 C)] 97.4 F (36.3 C) (07/09 1324) Pulse Rate:  [79-85] 85 (07/09 1324) Resp:  [16-18] 18 (07/09 1324) BP: (112-124)/(72-78) 124/78 (07/09 1324) SpO2:  [96 %-100 %] 100 % (07/09 1324) Physical Exam  Constitutional:   oriented to person, place, and time. appears well-developed and well-nourished. No distress.  HENT: /AT, PERRLA, no scleral icterus Mouth/Throat: Oropharynx is clear and moist. No oropharyngeal exudate. Small sub cm lesion with whitish exudate Cardiovascular: Normal rate, regular rhythm and normal heart sounds. Exam reveals no gallop and no friction rub.  No murmur heard.  Pulmonary/Chest: Effort normal and breath sounds normal. No respiratory distress.  has no wheezes.  Neck = supple, no nuchal rigidity Ext: right hand swelling improved. Trace edema. Ulcers are dry to right and left hand Abdominal: Soft. Bowel sounds are normal.  exhibits no distension. There is no tenderness.  Neurological: alert and oriented to person, place, and time.  Skin: Skin is warm and dry. Psychiatric: a normal mood and affect.  behavior is normal.   Lab Results Recent Labs    04/07/20 0357 04/08/20 0408  WBC 10.9* 9.5  HGB 10.1* 10.0*  HCT 32.6* 32.1*  NA 140 139  K 3.6 3.7  CL 100 101  CO2 26 26  BUN 26* 30*  CREATININE 1.08* 0.99   Liver Panel Recent Labs    04/07/20 0357 04/08/20 0408  PROT 6.8 6.9  ALBUMIN 3.6 3.7  AST 25 24  ALT 35 35  ALKPHOS 50 54  BILITOT 0.3 0.5  Microbiology: 7/7 blood cx = NGTD at 48hrs  Studies/Results: No results found.   Assessment/Plan: sphingomonas bacteremia = continue on ceftriaxone 2gm iv daily. Plan to treat for 14d using 7/6 as day 1  Leukocytosis = steadily improving. Today < 10  Mouth ulcers = continue with magic mouthwash x 7days to see that it improves  Will see back on Monday if still here. Otherwise, we will have appt set up to have her be seen in clinic as outpatient  Make sure to keep port accessed for discharge so that she can hook up abtx at home  Home health orders listed below  ----------- Diagnosis: bacteremia  Culture Result: sphingomonas  Allergies  Allergen Reactions  . Mushroom Extract Complex Anaphylaxis  . Na  Ferric Gluc Cplx In Sucrose Anaphylaxis  . Cymbalta [Duloxetine Hcl] Swelling and Anxiety  . Succinylcholine Other (See Comments)    Lock Jaw  . Buprenorphine Hcl Hives  . Compazine Other (See Comments)    Altered mental status Aggression  . Metoclopramide Other (See Comments)    Dystonia  . Morphine And Related Hives  . Ondansetron Hives and Rash  . Promethazine Hcl Hives  . Tegaderm Ag Mesh [Silver] Rash    Old formulation only, is able to tolerate new formulation    OPAT Orders Discharge antibiotics to be given via PICC line Discharge antibiotics: Per pharmacy protocol ceftriaxone 2gm IV  Duration: 14 days End Date: 7/20  [place] Per Protocol:  Home health RN for IV administration and teaching; PICC line care and labs.    Labs weekly while on IV antibiotics: _x_ CBC with differential _x_ BMP   _x_ Please de-access PORT of IV antibiotics  Fax weekly labs to [phone]  Clinic Follow Up Appt: 2 wk  @ [facility] for Infect[facility] Diseases Cell: [phone] Pager: [phone]  04/08/2020, 3:46 PM

## 2020-04-09 ENCOUNTER — Inpatient Hospital Stay (HOSPITAL_COMMUNITY): Payer: BC Managed Care – PPO

## 2020-04-09 LAB — BASIC METABOLIC PANEL
Anion gap: 9 (ref 5–15)
BUN: 27 mg/dL — ABNORMAL HIGH (ref 6–20)
CO2: 26 mmol/L (ref 22–32)
Calcium: 9 mg/dL (ref 8.9–10.3)
Chloride: 99 mmol/L (ref 98–111)
Creatinine, Ser: 1.09 mg/dL — ABNORMAL HIGH (ref 0.44–1.00)
GFR calc Af Amer: >60 mL/min (ref >60)
GFR calc non Af Amer: 60 mL/min — ABNORMAL LOW (ref >60)
Glucose, Bld: 183 mg/dL — ABNORMAL HIGH (ref 70–99)
Potassium: 3.6 mmol/L (ref 3.5–5.1)
Sodium: 134 mmol/L — ABNORMAL LOW (ref 135–145)

## 2020-04-09 LAB — CBC
HCT: 35.4 % — ABNORMAL LOW (ref 36.0–46.0)
Hemoglobin: 11.1 g/dL — ABNORMAL LOW (ref 12.0–15.0)
MCH: 29.6 pg (ref 26.0–34.0)
MCHC: 31.4 g/dL (ref 30.0–36.0)
MCV: 94.4 fL (ref 80.0–100.0)
Platelets: 362 10*3/uL (ref 150–400)
RBC: 3.75 MIL/uL — ABNORMAL LOW (ref 3.87–5.11)
RDW: 15.7 % — ABNORMAL HIGH (ref 11.5–15.5)
WBC: 10 10*3/uL (ref 4.0–10.5)
nRBC: 0 % (ref 0.0–0.2)

## 2020-04-09 NOTE — Progress Notes (Signed)
Pt c/o new swelling and pain to rib area just underneath right breast that seems to be getting worse. Notified TRH. New orders for Chest x-ray. Will continue to monitor.

## 2020-04-09 NOTE — Plan of Care (Signed)
  Problem: Clinical Measurements: Goal: Ability to maintain clinical measurements within normal limits will improve Outcome: Progressing Goal: Will remain free from infection Outcome: Progressing Goal: Diagnostic test results will improve Outcome: Progressing   Problem: Activity: Goal: Risk for activity intolerance will decrease Outcome: Progressing   Problem: Pain Managment: Goal: General experience of comfort will improve Outcome: Progressing

## 2020-04-09 NOTE — Progress Notes (Signed)
PROGRESS NOTE  [name] OQH:[id number] DOB: December 02, 1970 DOA: 04/01/2020 PCP: Su Grand, MD   LOS: 6 days   Brief Narrative / Interim history: 49 year old female with history of multiple skin cancers, breast cancer, Hodgkin's lymphoma, thyroid cancer, cervical cancer, pituitary tumor status post resection, hypothyroidism, vasculitis, adrenal insufficiency, chronic hypoxic respiratory failure, right heart failure, essential hypertension came into the hospital for left arm swelling and redness.  She received a presentation last month, she was treated with IV Lasix and antibiotics and discharged on a course of cefdinir and oral Lasix.  She started developing skin lesion from her vasculitis few days ago, the surrounding skin became red and swollen and was placed empirically on Bactrim.  She was also febrile to 102 at home.  She was eventually sent to the ED feeling like she failed oral antibiotics and was admitted to the hospital.  Blood cultures also showed GNRs.  Subjective / 24h Interval events: Complains of new onset right lower side chest pain right at the base of her right breast.  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  She has a history of recurrent left upper extremity cellulitis.  She was afebrile in the hospital however her white count has been on the high side.  She had ultrasound without evidence of DVT.  Infectious disease was consulted and currently recommending 14 days of IV ceftriaxone, 2 g/day.  Surveillance cultures obtained on 7/7 without growth, still negative today day 2  Active Problems Right-sided chest pain-unclear etiology at this point.  She is quite tender to palpation but I do not see a significant rash or a  fluctuating pocket underneath the skin.  I do not think an ultrasound would be useful right now but perhaps let this involved a little bit to see if it "declares itself".  Can also indicate early shingles in the setting of immunosuppression especially given location.  I do not think it is related to the port given the fact that it is quite superficial and tender to palpation  Intermittent cough spells, epistaxis, hemoptysis, history of asthma -intermittent flareups of coughing spells which makes her wheezing, hypoxic.  She has had several episodes of epistaxis no hemoptysis no coughing, pulmonary consulted and appreciate input.  CT scan of the chest on 7/5 without significant lower airway disease.  Continue steroids, Tessalon Perles and supportive management. Pulmonary evaluated patient, signed off 7/8.  Overall improving  Chronic hypoxic respiratory failure-on 3-4 L oxygen at home, continue home medications.  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  Back to oral steroids today  Steroid-induced fluid retention, history of chronic CHF with reduced EF-continue IV Lasix, she is on 40 mg twice daily.  Creatinine stable.  Echo done in May 2021 shows EF of 65-70%, normal LV function, normal RV function, moderate tricuspid regurg and PA pressure mildly elevated at 35. Tolerating diuresis well with stable renal function  Hypokalemia-replete as indicated while on Lasix, continue another dose today  Anxiety disorder-definitely plays a role in her perception of her health, she has a complex medical history but does seem to focus on a lot of daily details regarding WBC, RBC and so on.  Continue Klonopin, psychiatry evaluated patient.  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 aspirin  325 mg Oral QHS  . beclomethasone  1 puff Inhalation BID  . Chlorhexidine Gluconate Cloth  6 each Topical Daily  . clonazePAM  1 mg Oral BID  . colchicine  0.6 mg Oral Q1500  . dexlansoprazole  60 mg Oral Daily  . escitalopram  20 mg Oral QPM  . fluticasone furoate-vilanterol  1 puff Inhalation Daily  . furosemide  40 mg Intravenous BID BM  . hydrocortisone  40 mg Oral QAC breakfast   And  . hydrocortisone  20 mg Oral QAC supper  . levothyroxine  112 mcg Oral Q0600  . metoprolol succinate  37.5 mg Oral Daily  . rosuvastatin  10 mg Oral Daily  . topiramate  150 mg Oral QHS  . zolpidem  10 mg Oral QHS   Continuous Infusions: . cefTRIAXone (ROCEPHIN)  IV 2 g (04/08/20 1908)   PRN Meds:.albuterol, benzonatate, diphenhydrAMINE, dronabinol, HYDROmorphone (DILAUDID) injection, LORazepam, magic mouthwash w/lidocaine, oxyCODONE-acetaminophen, Rimegepant Sulfate, sodium chloride flush, sucralfate  Diet Orders (From admission, onward)    Start     Ordered   04/01/20 2207  Diet regular Room service appropriate? Yes; Fluid consistency: Thin  Diet effective now       Question Answer Comment  Room service appropriate? Yes   Fluid consistency: Thin      04/01/20 2206          DVT prophylaxis: Place and maintain sequential compression device Start: 04/04/20 1336     Code Status: DNR  Family Communication: no family at bedisde  Status is: Inpatient  Remains inpatient appropriate because: Monitor blood cultures per infectious disease, new onset right-sided chest pain   Dispo: The patient is from: Home              Anticipated d/c is to: Home              Anticipated d/c date is: 1 day              Patient currently is not medically stable to d/c.   Consultants:  Pulmonary  ID  Procedures:    none  Microbiology  Blood cultures - GNRs Blood cultures 7/6 - NGTD  Antimicrobials: Ceftriaxone    Objective: Vitals:   04/09/20 0531 04/09/20 1023 04/09/20 1025 04/09/20 1319  BP: 111/68 129/84 129/84 132/80  Pulse: 75 86 86 78  Resp: 16   18  Temp: 97.8 F (36.6 C)   97.8 F (36.6 C)  TempSrc: Oral     SpO2: 99%   99%  Weight:      Height:        Intake/Output Summary (Last 24 hours) at 04/09/2020 1425 Last data filed at 04/09/2020 1039 Gross per 24 hour  Intake 800 ml  Output 1950 ml  Net -1150 ml   Filed Weights   04/01/20 1456 04/06/20 0500  Weight: 89.8 kg 91.1 kg    Examination:  Constitutional: NAD, in bed Eyes: No icterus ENMT: Moist mucous membranes, few aphthous ulcers on the back of the throat Neck: normal, supple Respiratory: Diminished at the bases but overall clear, no wheezing, no crackles Cardiovascular: Regular rate and rhythm, 3/6 SEM, trace edema Abdomen: Bowel sounds positive Musculoskeletal: no clubbing / cyanosis.  Skin: Faint barely visible linear rash on the right chest where the pain is Neurologic: No focal deficits  Data Reviewed: I have independently reviewed following labs and imaging studies   CBC: Recent Labs  Lab 04/04/20 0314 04/04/20 0314 04/05/20 0343 04/06/20 0528 04/07/20 0357 04/08/20 0408 04/09/20 0402  WBC 9.4   < > 10.1 12.3* 10.9* 9.5 10.0  NEUTROABS 5.3  --  6.1 10.0*  --   --   --   HGB 10.2*   < > 10.4* 10.6* 10.1* 10.0* 11.1*  HCT 32.6*   < > 32.6* 34.3* 32.6* 32.1* 35.4*  MCV 94.2   < > 93.9 94.8 96.2 95.0 94.4  PLT 278   < > 277 304 316 320 362   < > = values in this interval not displayed.   Basic Metabolic Panel: Recent Labs  Lab 04/05/20 0343 04/06/20 0528 04/07/20 0357 04/08/20 0408 04/09/20 0402  NA 138 135 140 139 134*  K 2.9* 3.9 3.6 3.7 3.6  CL 94* 99 100 101 99  CO2 _0 GLUCOSE 160* 222* 166* 201* 183*  BUN 26* 25* 26* 30* 27*  CREATININE 1.22* 1.04* 1.08* 0.99  1.09*  CALCIUM 8.9 9.2 8.9 8.7* 9.0  MG 2.2 2.3 2.1 2.3  --   PHOS  --   --  3.7  --   --    Liver Function Tests: Recent Labs  Lab 04/07/20 0357 04/08/20 0408  AST 25 24  ALT 35 35  ALKPHOS 50 54  BILITOT 0.3 0.5  PROT 6.8 6.9  ALBUMIN 3.6 3.7   Coagulation Profile: No results for input(s): INR, PROTIME in the last 168 hours. HbA1C: No results for input(s): HGBA1C in the last 72 hours. CBG: No results for input(s): GLUCAP in the last 168 hours.  Recent Results (from the past 240 hour(s))  Culture, blood (single)     Status: Abnormal (Preliminary result)   Collection Time: 04/01/20  3:49 PM   Specimen: BLOOD  Result Value Ref Range Status   Specimen Description   Final    BLOOD PORTA CATH Performed at [facility] [address], [place], [place] [address]    Special Requests   Final    BOTTLES DRAWN AEROBIC AND ANAEROBIC Blood Culture adequate volume Performed at [facility] [address], [place], [place] [address]    Culture  Setup Time   Final    GRAM NEGATIVE RODS AEROBIC BOTTLE ONLY CRITICAL RESULT CALLED TO, READ BACK BY AND VERIFIED WITH: J. GRIMSLEY,PHARMD 2342 04/03/2020 T. TYSOR    Culture (A)  Final    SPHINGOMONAS PAUCIMOBILIS Sent to [facility] for further susceptibility testing. Performed at [facility], [place] [address], [place], [place] [address]    Report Status PENDING  Incomplete  Blood Culture ID Panel (Reflexed)     Status: None   Collection Time: 04/01/20  3:49 PM  Result Value Ref Range Status   Enterococcus species NOT DETECTED NOT DETECTED Final   Listeria monocytogenes NOT DETECTED NOT DETECTED Final   Staphylococcus species NOT DETECTED NOT DETECTED Final   Staphylococcus aureus (BCID) NOT DETECTED NOT DETECTED Final   Streptococcus species NOT DETECTED NOT DETECTED Final   Streptococcus agalactiae NOT DETECTED NOT DETECTED Final   Streptococcus pneumoniae NOT DETECTED NOT DETECTED Final     Streptococcus pyogenes NOT DETECTED NOT DETECTED Final   Acinetobacter baumannii NOT DETECTED NOT DETECTED Final   Enterobacteriaceae species NOT  DETECTED NOT DETECTED Final   Enterobacter cloacae complex NOT DETECTED NOT DETECTED Final   Escherichia coli NOT DETECTED NOT DETECTED Final   Klebsiella oxytoca NOT DETECTED NOT DETECTED Final   Klebsiella pneumoniae NOT DETECTED NOT DETECTED Final   Proteus species NOT DETECTED NOT DETECTED Final   Serratia marcescens NOT DETECTED NOT DETECTED Final   Haemophilus influenzae NOT DETECTED NOT DETECTED Final   Neisseria meningitidis NOT DETECTED NOT DETECTED Final   Pseudomonas aeruginosa NOT DETECTED NOT DETECTED Final   Candida albicans NOT DETECTED NOT DETECTED Final   Candida glabrata NOT DETECTED NOT DETECTED Final   Candida krusei NOT DETECTED NOT DETECTED Final   Candida parapsilosis NOT DETECTED NOT DETECTED Final   Candida tropicalis NOT DETECTED NOT DETECTED Final    Comment: Performed at [facility], [place] [address] [place], [place] [address]  SARS Coronavirus 2 by RT PCR (hospital order, performed in [facility] hospital lab) Nasopharyngeal Nasopharyngeal Swab     Status: None   Collection Time: 04/01/20  6:31 PM   Specimen: Nasopharyngeal Swab  Result Value Ref Range Status   SARS Coronavirus 2 NEGATIVE NEGATIVE Final    Comment: (NOTE) SARS-CoV-2 target nucleic acids are NOT DETECTED.  The SARS-CoV-2 RNA is generally detectable in upper and lower respiratory specimens during the acute phase of infection. The lowest concentration of SARS-CoV-2 viral copies this assay can detect is 250 copies / mL. A negative result does not preclude SARS-CoV-2 infection and should not be used as the sole basis for treatment or other patient management decisions.  A negative result may occur with improper specimen collection / handling, submission of specimen other than nasopharyngeal swab, presence of viral mutation(s) within  the areas targeted by this assay, and inadequate number of viral copies (<250 copies / mL). A negative result must be combined with clinical observations, patient history, and epidemiological information.  Fact Sheet for Patients:   StrictlyIdeas.no  Fact Sheet for Healthcare Providers: BankingDealers.co.za  This test is not yet approved or  cleared by the [place] FDA and has been authorized for detection and/or diagnosis of SARS-CoV-2 by FDA under an Emergency Use Authorization (EUA).  This EUA will remain in effect (meaning this test can be used) for the duration of the COVID-19 declaration under Section 564(b)(1) of the Act, 21 U.S.C. section 360bbb-3(b)(1), unless the authorization is terminated or revoked sooner.  Performed at [facility], [place] [address] [place], [place] [address]   Culture, blood (routine x 2)     Status: None (Preliminary result)   Collection Time: 04/06/20  5:28 AM   Specimen: Porta Cath; Blood  Result Value Ref Range Status   Specimen Description   Final    PORTA CATH RT CHEST Performed at [facility], [place] [address] [place], [place] [address]    Special Requests   Final    BOTTLES DRAWN AEROBIC AND ANAEROBIC Blood Culture adequate volume Performed at [facility] [address] [place], [place] [address]    Culture   Final    NO GROWTH 2 DAYS Performed at [facility] [address] [place],  [address]    Report Status PENDING  Incomplete     Radiology Studies: DG CHEST PORT 1 VIEW  Result Date: 04/09/2020 CLINICAL DATA:  Mass in chest. EXAM: PORTABLE CHEST 1 VIEW COMPARISON:  Chest x-ray dated 04/03/2020. chest CT dated 04/04/2020. FINDINGS: Heart size and mediastinal contours are within normal  limits. RIGHT chest wall Port-A-Cath is stable in position with tip overlying the RIGHT atrium. Lungs are clear. No pleural  effusion or pneumothorax is seen. Osseous structures about the chest are unremarkable. IMPRESSION: 1. No active disease. No evidence of pneumonia or pulmonary edema. 2. RIGHT chest wall Port-A-Cath is stable in position. Electronically Signed   By: Franki Cabot M.D.   On: 04/09/2020 07:01    Marzetta Board, MD, PhD [facility]  Between 7 am - 7 pm I am available, please contact me via Amion or Securechat  Between 7 pm - 7 am I am not available, please contact night coverage MD/APP via Amion

## 2020-04-10 ENCOUNTER — Inpatient Hospital Stay (HOSPITAL_COMMUNITY): Payer: BC Managed Care – PPO

## 2020-04-10 DIAGNOSIS — R042 Hemoptysis: Secondary | ICD-10-CM

## 2020-04-10 DIAGNOSIS — Z9981 Dependence on supplemental oxygen: Secondary | ICD-10-CM

## 2020-04-10 DIAGNOSIS — Z8571 Personal history of Hodgkin lymphoma: Secondary | ICD-10-CM

## 2020-04-10 DIAGNOSIS — Z86000 Personal history of in-situ neoplasm of breast: Secondary | ICD-10-CM

## 2020-04-10 DIAGNOSIS — Z8541 Personal history of malignant neoplasm of cervix uteri: Secondary | ICD-10-CM

## 2020-04-10 DIAGNOSIS — I10 Essential (primary) hypertension: Secondary | ICD-10-CM

## 2020-04-10 LAB — CBC
HCT: 33.4 % — ABNORMAL LOW (ref 36.0–46.0)
Hemoglobin: 10.5 g/dL — ABNORMAL LOW (ref 12.0–15.0)
MCH: 29.7 pg (ref 26.0–34.0)
MCHC: 31.4 g/dL (ref 30.0–36.0)
MCV: 94.6 fL (ref 80.0–100.0)
Platelets: 342 10*3/uL (ref 150–400)
RBC: 3.53 MIL/uL — ABNORMAL LOW (ref 3.87–5.11)
RDW: 15.6 % — ABNORMAL HIGH (ref 11.5–15.5)
WBC: 11.8 10*3/uL — ABNORMAL HIGH (ref 4.0–10.5)
nRBC: 0 % (ref 0.0–0.2)

## 2020-04-10 LAB — BASIC METABOLIC PANEL
Anion gap: 12 (ref 5–15)
BUN: 30 mg/dL — ABNORMAL HIGH (ref 6–20)
CO2: 28 mmol/L (ref 22–32)
Calcium: 9.2 mg/dL (ref 8.9–10.3)
Chloride: 101 mmol/L (ref 98–111)
Creatinine, Ser: 1.02 mg/dL — ABNORMAL HIGH (ref 0.44–1.00)
GFR calc Af Amer: >60 mL/min (ref >60)
GFR calc non Af Amer: >60 mL/min (ref >60)
Glucose, Bld: 131 mg/dL — ABNORMAL HIGH (ref 70–99)
Potassium: 3.2 mmol/L — ABNORMAL LOW (ref 3.5–5.1)
Sodium: 141 mmol/L (ref 135–145)

## 2020-04-10 MED ORDER — POTASSIUM CHLORIDE CRYS ER 20 MEQ PO TBCR
40.0000 meq | EXTENDED_RELEASE_TABLET | ORAL | Status: AC
  Administered 2020-04-10 (×2): 40 meq via ORAL
  Filled 2020-04-10 (×2): qty 2

## 2020-04-10 MED ORDER — FUROSEMIDE 40 MG PO TABS: 40.0000 mg | ORAL_TABLET | Freq: Two times a day (BID) | ORAL | 0 refills | Status: DC

## 2020-04-10 MED ORDER — DRONABINOL 2.5 MG PO CAPS: 2.5000 mg | ORAL_CAPSULE | Freq: Two times a day (BID) | ORAL | 0 refills | Status: DC | PRN

## 2020-04-10 MED ORDER — HEPARIN SOD (PORK) LOCK FLUSH 100 UNIT/ML IV SOLN
500.0000 [IU] | INTRAVENOUS | Status: AC | PRN
  Administered 2020-04-10: 500 [IU]
  Filled 2020-04-10: qty 5

## 2020-04-10 MED ORDER — OXYCODONE HCL 5 MG PO TABS: 5.0000 mg | ORAL_TABLET | Freq: Four times a day (QID) | ORAL | 0 refills | Status: AC | PRN

## 2020-04-10 MED ORDER — CEFTRIAXONE IV (FOR PTA / DISCHARGE USE ONLY): 2.0000 g | INTRAVENOUS | 0 refills | Status: AC

## 2020-04-10 NOTE — Plan of Care (Signed)
Problem: Pain Managment: Goal: General experience of comfort will improve Outcome: Progressing   Problem: Skin Integrity: Goal: Risk for impaired skin integrity will decrease Outcome: Progressing   Problem: Activity: Goal: Risk for activity intolerance will decrease Outcome: Progressing

## 2020-04-10 NOTE — Discharge Summary (Signed)
Physician Discharge Summary  [name] OEU:[id number] DOB: September 28, 1971 DOA: 04/01/2020  PCP: Su Grand, MD  Admit date: 04/01/2020 Discharge date: 04/10/2020  Admitted From: home Disposition:  home  Recommendations for Outpatient Follow-up:  1. Follow up with PCP in 1-2 weeks 2. Please obtain BMP/CBC in one week 3. Follow up with Dr. Baxter Flattery in 1 week  Home Health: RN Equipment/Devices: none  Discharge Condition: stable CODE STATUS: Full code Diet recommendation: regular  HPI: Per admitting MD, [name] is a 49 y.o. female with medical history significant for significant medical complexity. Multiple cancer history (breast cancer, hodgkins lymphoma, thyroid cancer s/p resection, cervical cancer - all in remission), chest lymphoma, post-surgical hypothyroid, pituitary tumor s/p resection, adrenal insufficiency on steroids, nephrolithiasis, venous thrombosis, migraines, asthma, right heart failure, chronic O2 (3-4 L [place]), htn, and recurrent ulcers and swelling of left arm thought to be possible vasculitis, who presents with the above. Similar presentation and admission last month. Headache was treated, was given stress dose steroids, had nausea treated with IV benadryl and narinol, had worsening lymphedema of left upper extremity that was treated with IV lasix. She reports after discharge several weeks of feeling good. Now few weeks of returning symptoms. Fatigue, nausea without vomiting or diarrhea. Yesterday measured temperature at home of 102, no fever since. No cough or new sob. No dysuria or hematuria. Does note an ulcer on left 3rd digit and increased swelling and redness of left arm. Says has needed antibiotics in the past for recurrent infections in the left arm and recently finished a course of bactrim. Is followed by dermatology and rheumatology at [facility], [facility], [facility], [facility], and neurology and gastroenterology at [facility].  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  She has a history of recurrent left upper extremity cellulitis.  She was afebrile in the hospital however her white count has been on the high side.  She had ultrasound without evidence of DVT.  Infectious disease was consulted and currently recommending 14 days of IV ceftriaxone, 2 g/day.  Surveillance cultures obtained on 7/7 without growth, still negative today day 3  Active Problems Right-sided chest pain-unclear etiology at this point.  She is quite tender to palpation but I do not see a significant rash or a fluctuating pocket underneath the skin.  Obtained a soft tissue US which was negative. Symptomatic management, ? MSK Intermittent cough spells, epistaxis, hemoptysis, history of asthma -intermittent flareups of coughing spells which makes her wheezing, hypoxic.  She has had several episodes of epistaxis no hemoptysis no coughing, pulmonary consulted and appreciate input.  CT scan of the chest on 7/5 without significant lower airway disease.  Continue steroids, Tessalon Perles and supportive management. Pulmonary evaluated patient, signed off 7/8.  Overall improving Chronic hypoxic respiratory failure-on 3-4 L oxygen at home, continue home medications.  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 Resume home medications Steroid-induced fluid retention, history of chronic CHF with reduced EF-continue home diuretics. Echo done in May 2021 shows EF of 65-70%, normal LV function, normal RV function, moderate tricuspid regurg and PA pressure mildly elevated at 35. 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   Complete by: As directed    [facility] to provide Cath Flo 21m   Complete by: As directed    Administer for PICC line occlusion and as ordered by physician for other access device issues.   Anaphylaxis Kit: Provided to treat any anaphylactic reaction to the medication being provided to the patient if First Dose or when requested by physician   Complete by: As directed    Epinephrine 159mml vial / amp: Administer 0.36m64m0.36ml38mubcutaneously once for moderate to severe anaphylaxis, nurse to call physician and pharmacy when reaction occurs and call 911 if needed for immediate care   Diphenhydramine 50mg65mIV vial: Administer 25-50mg 26mM PRN for first dose reaction, rash, itching, mild reaction, nurse to call physician and pharmacy when reaction occurs   Sodium Chloride 0.9% NS 500ml I31mdminister if needed for hypovolemic blood pressure drop or as ordered by physician after call to physician with anaphylactic reaction   Change dressing on IV access line weekly and PRN   Complete by: As directed    Flush IV access with Sodium Chloride 0.9% and Heparin 10 units/ml or 100 units/ml   Complete by: As directed    Home infusion instructions - [facility]   Complete by: As directed    Instructions: Flush IV access with Sodium Chloride 0.9% and Heparin 10units/ml or 100units/ml   Change dressing on IV access line: Weekly and PRN   Instructions Cath Flo 2mg:  A56mnister for PICC Line occlusion and as ordered by physician for other access device   [facility] pharmacist to adjust dose for: Vancomycin, Aminoglycosides and other anti-infective therapies as requested by physician   Method of administration may be changed at the discretion of home infusion pharmacist based upon assessment of the patient and/or caregiver's ability to self-administer the medication ordered   Complete by: As directed      Allergies as of 04/10/2020      Reactions   Mushroom Extract Complex Anaphylaxis   Na Ferric Gluc Cplx In Sucrose Anaphylaxis   Cymbalta [duloxetine Hcl] Swelling, Anxiety   Succinylcholine Other (See Comments)   Lock Jaw   Buprenorphine Hcl Hives   Compazine Other (See Comments)   Altered mental status Aggression   Metoclopramide Other (See Comments)   Dystonia   Morphine And Related Hives   Ondansetron Hives, Rash   Promethazine Hcl Hives   Tegaderm Ag Mesh [silver] Rash   Old formulation only, is able to tolerate new formulation      Medication List    STOP taking these medications   budesonide 0.25 MG/2ML nebulizer solution Commonly known as: PULMICORT   LORazepam 0.5 MG tablet Commonly known as: ATIVAN   oxyCODONE-acetaminophen 5-325 MG tablet Commonly known as: PERCOCET/ROXICET   sulfamethoxazole-trimethoprim 800-160 MG tablet Commonly known as: BACTRIM DS     TAKE these medications   aspirin 325 MG tablet Take 325 mg by mouth at bedtime.   beclomethasone 80 MCG/ACT inhaler Commonly known as: QVAR Inhale 1 puff into the lungs 2 (two) times daily.   benzonatate 200 MG capsule Commonly known as: TESSALON Take 1 capsule (200 mg total) by mouth 3 (three) times daily as needed for  cough.   cefTRIAXone  IVPB Commonly known as: ROCEPHIN Inject 2 g into the vein daily for 10 days. Indication:  Bacteremia First Dose: Yes Last Day of Therapy:  04/20/2020  Labs - Once weekly:  CBC/D and BMP, Labs - Every other  week:  ESR and CRP Method of administration: IV Push Method of administration may be changed at the discretion of home infusion pharmacist based upon assessment of the patient and/or caregiver's ability to self-administer the medication ordered.   clonazePAM 1 MG tablet Commonly known as: KLONOPIN Take 1 mg by mouth 2 (two) times daily.   colchicine 0.6 MG tablet Take 0.6 mg by mouth daily in the afternoon.   Dexilant 60 MG capsule Generic drug: dexlansoprazole Take 60 mg by mouth daily.   diphenhydrAMINE 50 MG/ML injection Commonly known as: BENADRYL Inject 1 mL (50 mg total) into the vein every 6 (six) hours as needed (nausea).   dronabinol 2.5 MG capsule Commonly known as: MARINOL Take 1 capsule (2.5 mg total) by mouth 2 (two) times daily as needed (nausea).   EPINEPHrine 0.3 mg/0.3 mL Soaj injection Commonly known as: EPI-PEN INJECT 0.3 MLS (0.3 MG TOTAL) INTO THE MUSCLE AS NEEDED FOR ANAPHYLAXIS.   escitalopram 20 MG tablet Commonly known as: LEXAPRO Take 20 mg by mouth every evening.   estradiol 2 MG tablet Commonly known as: ESTRACE Take 2 mg by mouth See admin instructions. Takes 1 tablet by mouth every 3 days.   fluticasone furoate-vilanterol 200-25 MCG/INH Aepb Commonly known as: BREO ELLIPTA Inhale 1 puff into the lungs daily.   furosemide 40 MG tablet Commonly known as: LASIX Take 1 tablet (40 mg total) by mouth in the morning and at bedtime. What changed:   when to take this  Another medication with the same name was removed. Continue taking this medication, and follow the directions you see here.   Galcanezumab-gnlm 120 MG/ML Soaj Inject 120 mg into the skin every 28 (twenty-eight) days.   gi cocktail Susp suspension Take 30 mLs daily as needed by mouth for indigestion (BLEEDING ULCERS). Shake well.   heparin NICU/SCN flush 1 UNIT/ML Soln Heparin Lock Flush Solution - 500 USP UNITS/5 mL  #30 pack with 1 refill What changed:   how much to  take  how to take this  when to take this  additional instructions   hydrocortisone 10 MG tablet Commonly known as: CORTEF Take 1-2 tablets (10-20 mg total) by mouth See admin instructions. Take 20 mg in the am and 13m in the evening What changed: additional instructions   levothyroxine 112 MCG tablet Commonly known as: SYNTHROID Take 112 mcg by mouth daily before breakfast.   metoprolol succinate 25 MG 24 hr tablet Commonly known as: TOPROL-XL Take 37.5 mg by mouth daily.   Nucala 100 MG Solr Generic drug: Mepolizumab Inject 100 mg into the skin every 28 (twenty-eight) days.   Nurtec 75 MG Tbdp Generic drug: Rimegepant Sulfate Take 75 mg by mouth daily as needed (Migraine).   oxyCODONE 5 MG immediate release tablet Commonly known as: Roxicodone Take 1 tablet (5 mg total) by mouth every 6 (six) hours as needed for up to 5 days.   OXYGEN Inhale 4 L into the lungs continuous.   rosuvastatin 10 MG tablet Commonly known as: CRESTOR Take 10 mg by mouth daily.   silver sulfADIAZINE 1 % cream Commonly known as: SILVADENE Apply 1 application topically daily as needed (Open Wound).   sodium chloride flush 0.9 % Soln injection  10 mL syringes - #30 count with 1 refill   sucralfate 1 g tablet Commonly known as: CARAFATE Take 1 g by mouth daily as needed (FOR ULCERS).   topiramate 50 MG tablet Commonly known as: TOPAMAX Take 150 mg by mouth at bedtime.   Ventolin HFA 108 (90 Base) MCG/ACT inhaler Generic drug: albuterol Inhale 2 puffs into the lungs every 6 (six) hours as needed for wheezing or shortness of breath.   albuterol (2.5 MG/3ML) 0.083% NEBU 3 mL, albuterol (5 MG/ML) 0.5% NEBU 0.5 mL Inhale 3 mg into the lungs every 6 (six) hours as needed (SOB).   zolpidem 12.5 MG CR tablet Commonly known as: AMBIEN CR Take 12.5 mg by mouth at bedtime.            Discharge Care Instructions  (From admission, onward)         Start     Ordered   04/10/20 0000   Change dressing on IV access line weekly and PRN  (Home infusion instructions - [facility] )        04/10/20 1018          Consultations:  Pulmonary  ID  Procedures/Studies:  DG Chest 2 View  Result Date: 04/03/2020 CLINICAL DATA:  Hemoptysis, multiple malignancies EXAM: CHEST - 2 VIEW COMPARISON:  04/01/2020 FINDINGS: The heart size and mediastinal contours are within normal limits. Right chest port catheter. Both lungs are clear. The visualized skeletal structures are unremarkable. IMPRESSION: No acute abnormality of the lungs. Consider CT to further evaluate otherwise unexplained hemoptysis. Electronically Signed   By: Eddie Candle M.D.   On: 04/03/2020 13:47   DG Chest 2 View  Result Date: 04/01/2020 CLINICAL DATA:  Shortness of breath EXAM: CHEST - 2 VIEW COMPARISON:  02/10/2020 FINDINGS: Right Port-A-Cath remains in place, unchanged. Heart is normal size. No confluent airspace opacities or effusions. No acute bony abnormality. IMPRESSION: No active cardiopulmonary disease. Electronically Signed   By: Rolm Baptise M.D.   On: 04/01/2020 16:23   CT Chest High Resolution  Result Date: 04/04/2020 CLINICAL DATA:  Hemoptysis, shortness of breath, evaluate for acute interstitial pneumonitis EXAM: CT CHEST WITHOUT CONTRAST TECHNIQUE: Multidetector CT imaging of the chest was performed following the standard protocol without intravenous contrast. High resolution imaging of the lungs, as well as inspiratory and expiratory imaging, was performed. COMPARISON:  02/12/2020 FINDINGS: Cardiovascular: Right chest port catheter. Aortic atherosclerosis. Normal heart size. No pericardial effusion. Mediastinum/Nodes: No enlarged mediastinal, hilar, or axillary lymph nodes. Trachea and esophagus demonstrate no significant findings. Lungs/Pleura: There is mild partial atelectasis throughout the lungs, the primary axial sequence is performed in a very expiratory phase. There is mild lobular air  trapping. Small, incidental benign extra extrapulmonary fat lobulation of the right hemidiaphragm (series 7, image 48). No pleural effusion or pneumothorax. Upper Abdomen: No acute abnormality. Musculoskeletal: No chest wall mass or suspicious bone lesions identified. Status post bilateral mastectomy and implant reconstruction. Right axillary lymph node dissection. IMPRESSION: 1. No acute CT findings to explain hemoptysis. 2. There is mild lobular air trapping, suggestive of small airways disease. 3. Aortic Atherosclerosis (ICD10-I70.0). Electronically Signed   By: Eddie Candle M.D.   On: 04/04/2020 17:08   DG CHEST PORT 1 VIEW  Result Date: 04/09/2020 CLINICAL DATA:  Mass in chest. EXAM: PORTABLE CHEST 1 VIEW COMPARISON:  Chest x-ray dated 04/03/2020. chest CT dated 04/04/2020. FINDINGS: Heart size and mediastinal contours are within normal limits. RIGHT chest wall Port-A-Cath is stable in position  with tip overlying the RIGHT atrium. Lungs are clear. No pleural effusion or pneumothorax is seen. Osseous structures about the chest are unremarkable. IMPRESSION: 1. No active disease. No evidence of pneumonia or pulmonary edema. 2. RIGHT chest wall Port-A-Cath is stable in position. Electronically Signed   By: Franki Cabot M.D.   On: 04/09/2020 07:01   [place] CHEST SOFT TISSUE  Result Date: 04/10/2020 CLINICAL DATA:  Right anterior chest wall mass. EXAM: CHEST ULTRASOUND COMPARISON:  Chest CT 04/04/2020. FINDINGS: No solid mass, cyst, hematoma or abscess is identified. Fairly homogeneous appearing subcutaneous fat. Recommend clinical surveillance. MR imaging may be helpful for further evaluation if indicated. IMPRESSION: Unremarkable ultrasound examination of the right lower chest wall. Electronically Signed   By: Marijo Sanes M.D.   On: 04/10/2020 08:44   UE VENOUS DUPLEX ([facility] & [facility] 7 am - 7 pm)  Result Date: 04/01/2020 UPPER VENOUS STUDY  Indications: Swelling Limitations: Body habitus and poor  ultrasound/tissue interface. Comparison Study: No prior studies. Performing Technologist: Oliver Hum RVT  Examination Guidelines: A complete evaluation includes B-mode imaging, spectral Doppler, color Doppler, and power Doppler as needed of all accessible portions of each vessel. Bilateral testing is considered an integral part of a complete examination. Limited examinations for reoccurring indications may be performed as noted.  Right Findings: +----------+------------+---------+-----------+----------+-------+ RIGHT     CompressiblePhasicitySpontaneousPropertiesSummary +----------+------------+---------+-----------+----------+-------+ Subclavian    Full       Yes       Yes                      +----------+------------+---------+-----------+----------+-------+  Left Findings: +----------+------------+---------+-----------+----------+-------+ LEFT      CompressiblePhasicitySpontaneousPropertiesSummary +----------+------------+---------+-----------+----------+-------+ IJV           Full       Yes       No                       +----------+------------+---------+-----------+----------+-------+ Subclavian    Full       Yes       No                       +----------+------------+---------+-----------+----------+-------+ Axillary      Full       Yes       No                       +----------+------------+---------+-----------+----------+-------+ Brachial      Full       Yes       No                       +----------+------------+---------+-----------+----------+-------+ Radial        Full                                          +----------+------------+---------+-----------+----------+-------+ Ulnar         Full                                          +----------+------------+---------+-----------+----------+-------+ Cephalic      Full                                          +----------+------------+---------+-----------+----------+-------+  Basilic       Full                                          +----------+------------+---------+-----------+----------+-------+  Summary:  Right: No evidence of thrombosis in the subclavian.  Left: No evidence of deep vein thrombosis in the upper extremity. No evidence of superficial vein thrombosis in the upper extremity.  *See table(s) above for measurements and observations.  Diagnosing physician: Monica Martinez MD Electronically signed by Monica Martinez MD on 04/01/2020 at 6:33:43 PM.    Final       Subjective: - no chest pain, shortness of breath, no abdominal pain, nausea or vomiting.   Discharge Exam: BP 116/75 (BP Location: Right Arm)   Pulse 78   Temp 98.8 F (37.1 C)   Resp 16   Ht _0  (1.651 m)   Wt 91.1 kg   LMP  (LMP Unknown)   SpO2 100%   BMI 33.42 kg/m   General: Pt is alert, awake, not in acute distress Cardiovascular: RRR, S1/S2 +, no rubs, no gallops Respiratory: CTA bilaterally, no wheezing, no rhonchi Abdominal: Soft, NT, ND, bowel sounds + Extremities: no edema, no cyanosis    The results of significant diagnostics from this hospitalization (including imaging, microbiology, ancillary and laboratory) are listed below for reference.     Microbiology: Recent Results (from the past 240 hour(s))  Culture, blood (single)     Status: Abnormal (Preliminary result)   Collection Time: 04/01/20  3:49 PM   Specimen: BLOOD  Result Value Ref Range Status   Specimen Description   Final    BLOOD PORTA CATH Performed at [facility] [address] [place], [place] [address]    Special Requests   Final    BOTTLES DRAWN AEROBIC AND ANAEROBIC Blood Culture adequate volume Performed at [facility] [address], [place], [place] [address]    Culture  Setup Time   Final    GRAM NEGATIVE RODS AEROBIC BOTTLE ONLY CRITICAL RESULT CALLED TO, READ BACK BY AND VERIFIED WITH: J. GRIMSLEY,PHARMD [id number] 04/03/2020 T. TYSOR      Culture (A)  Final    SPHINGOMONAS PAUCIMOBILIS Sent to [facility] for further susceptibility testing. Performed at [facility], [place] [address] [place], [place] [address]    Report Status PENDING  Incomplete  Blood Culture ID Panel (Reflexed)     Status: None   Collection Time: 04/01/20  3:49 PM  Result Value Ref Range Status   Enterococcus species NOT DETECTED NOT DETECTED Final   Listeria monocytogenes NOT DETECTED NOT DETECTED Final   Staphylococcus species NOT DETECTED NOT DETECTED Final   Staphylococcus aureus (BCID) NOT DETECTED NOT DETECTED Final   Streptococcus species NOT DETECTED NOT DETECTED Final   Streptococcus agalactiae NOT DETECTED NOT DETECTED Final   Streptococcus pneumoniae NOT DETECTED NOT DETECTED Final   Streptococcus pyogenes NOT DETECTED NOT DETECTED Final   Acinetobacter baumannii NOT DETECTED NOT DETECTED Final   Enterobacteriaceae species NOT DETECTED NOT DETECTED Final   Enterobacter cloacae complex NOT DETECTED NOT DETECTED Final   Escherichia coli NOT DETECTED NOT DETECTED Final   Klebsiella oxytoca NOT DETECTED NOT DETECTED Final   Klebsiella pneumoniae NOT DETECTED NOT DETECTED Final   Proteus species NOT DETECTED NOT DETECTED Final   Serratia marcescens NOT DETECTED NOT DETECTED Final   Haemophilus influenzae NOT DETECTED  NOT DETECTED Final   Neisseria meningitidis NOT DETECTED NOT DETECTED Final   Pseudomonas aeruginosa NOT DETECTED NOT DETECTED Final   Candida albicans NOT DETECTED NOT DETECTED Final   Candida glabrata NOT DETECTED NOT DETECTED Final   Candida krusei NOT DETECTED NOT DETECTED Final   Candida parapsilosis NOT DETECTED NOT DETECTED Final   Candida tropicalis NOT DETECTED NOT DETECTED Final    Comment: Performed at [facility], [place] [address] [place], [place] [address]  SARS Coronavirus 2 by RT PCR (hospital order, performed in [facility] hospital lab) Nasopharyngeal Nasopharyngeal Swab     Status: None   Collection  Time: 04/01/20  6:31 PM   Specimen: Nasopharyngeal Swab  Result Value Ref Range Status   SARS Coronavirus 2 NEGATIVE NEGATIVE Final    Comment: (NOTE) SARS-CoV-2 target nucleic acids are NOT DETECTED.  The SARS-CoV-2 RNA is generally detectable in upper and lower respiratory specimens during the acute phase of infection. The lowest concentration of SARS-CoV-2 viral copies this assay can detect is 250 copies / mL. A negative result does not preclude SARS-CoV-2 infection and should not be used as the sole basis for treatment or other patient management decisions.  A negative result may occur with improper specimen collection / handling, submission of specimen other than nasopharyngeal swab, presence of viral mutation(s) within the areas targeted by this assay, and inadequate number of viral copies (<250 copies / mL). A negative result must be combined with clinical observations, patient history, and epidemiological information.  Fact Sheet for Patients:   StrictlyIdeas.no  Fact Sheet for Healthcare Providers: BankingDealers.co.za  This test is not yet approved or  cleared by the [place] FDA and has been authorized for detection and/or diagnosis of SARS-CoV-2 by FDA under an Emergency Use Authorization (EUA).  This EUA will remain in effect (meaning this test can be used) for the duration of the COVID-19 declaration under Section 564(b)(1) of the Act, 21 U.S.C. section 360bbb-3(b)(1), unless the authorization is terminated or revoked sooner.  Performed at [facility], [place] [address], [place], [place] [address]   Culture, blood (routine x 2)     Status: None (Preliminary result)   Collection Time: 04/06/20  5:28 AM   Specimen: Porta Cath; Blood  Result Value Ref Range Status   Specimen Description   Final    PORTA CATH RT CHEST Performed at [facility], [place] [address], [place],  [place] [address]    Special Requests   Final    BOTTLES DRAWN AEROBIC AND ANAEROBIC Blood Culture adequate volume Performed at [facility] [address], [place], [place] [address]    Culture   Final    NO GROWTH 3 DAYS Performed at [facility], [place] [address], [place], [place] [address]    Report Status PENDING  Incomplete     Labs: Basic Metabolic Panel: Recent Labs  Lab 04/05/20 0343 04/05/20 0343 04/06/20 0528 04/07/20 0357 04/08/20 0408 04/09/20 0402 04/10/20 0422  NA 138   < > 135 140 139 134* 141  K 2.9*   < > 3.9 3.6 3.7 3.6 3.2*  CL 94*   < > 99 100 101 99 101  CO2 31   < > _0 GLUCOSE 160*   < > 222* 166* 201* 183* 131*  BUN 26*   < > 25* 26* 30* 27* 30*  CREATININE 1.22*   < > 1.04* 1.08* 0.99 1.09*  1.02*  CALCIUM 8.9   < > 9.2 8.9 8.7* 9.0 9.2  MG 2.2  --  2.3 2.1 2.3  --   --   PHOS  --   --   --  3.7  --   --   --    < > = values in this interval not displayed.   Liver Function Tests: Recent Labs  Lab 04/07/20 0357 04/08/20 0408  AST 25 24  ALT 35 35  ALKPHOS 50 54  BILITOT 0.3 0.5  PROT 6.8 6.9  ALBUMIN 3.6 3.7   CBC: Recent Labs  Lab 04/04/20 0314 04/04/20 0314 04/05/20 0343 04/05/20 0343 04/06/20 0528 04/07/20 0357 04/08/20 0408 04/09/20 0402 04/10/20 0422  WBC 9.4   < > 10.1   < > 12.3* 10.9* 9.5 10.0 11.8*  NEUTROABS 5.3  --  6.1  --  10.0*  --   --   --   --   HGB 10.2*   < > 10.4*   < > 10.6* 10.1* 10.0* 11.1* 10.5*  HCT 32.6*   < > 32.6*   < > 34.3* 32.6* 32.1* 35.4* 33.4*  MCV 94.2   < > 93.9   < > 94.8 96.2 95.0 94.4 94.6  PLT 278   < > 277   < > 304 316 320 362 342   < > = values in this interval not displayed.   CBG: No results for input(s): GLUCAP in the last 168 hours. Hgb A1c No results for input(s): HGBA1C in the last 72 hours. Lipid Profile No results for input(s): CHOL, HDL, LDLCALC, TRIG, CHOLHDL, LDLDIRECT in the last 72 hours. Thyroid function studies No results for input(s):  TSH, T4TOTAL, T3FREE, THYROIDAB in the last 72 hours.  Invalid input(s): FREET3 Urinalysis    Component Value Date/Time   COLORURINE YELLOW 04/01/2020 1945   APPEARANCEUR CLEAR 04/01/2020 1945   LABSPEC 1.009 04/01/2020 1945   PHURINE 6.0 04/01/2020 1945   GLUCOSEU NEGATIVE 04/01/2020 1945   HGBUR NEGATIVE 04/01/2020 1945   BILIRUBINUR NEGATIVE 04/01/2020 [place] NEGATIVE 04/01/2020 1945   PROTEINUR NEGATIVE 04/01/2020 1945   UROBILINOGEN 1.0 06/16/2015 1045   NITRITE NEGATIVE 04/01/2020 1945   LEUKOCYTESUR NEGATIVE 04/01/2020 1945    FURTHER DISCHARGE INSTRUCTIONS:   Get Medicines reviewed and adjusted: Please take all your medications with you for your next visit with your Primary MD   Laboratory/radiological data: Please request your Primary MD to go over all hospital tests and procedure/radiological results at the follow up, please ask your Primary MD to get all Hospital records sent to his/her office.   In some cases, they will be blood work, cultures and biopsy results pending at the time of your discharge. Please request that your primary care M.D. goes through all the records of your hospital data and follows up on these results.   Also Note the following: If you experience worsening of your admission symptoms, develop shortness of breath, life threatening emergency, suicidal or homicidal thoughts you must seek medical attention immediately by calling 911 or calling your MD immediately  if symptoms less severe.   You must read complete instructions/literature along with all the possible adverse reactions/side effects for all the Medicines you take and that have been prescribed to you. Take any new Medicines after you have completely understood and accpet all the possible adverse reactions/side effects.    Do not drive when taking Pain medications or sleeping medications (Benzodaizepines)   Do not take more than  prescribed Pain, Sleep and Anxiety Medications. It  is not advisable to combine anxiety,sleep and pain medications without talking with your primary care practitioner   Special Instructions: If you have smoked or chewed Tobacco  in the last 2 yrs please stop smoking, stop any regular Alcohol  and or any Recreational drug use.   Wear Seat belts while driving.   Please note: You were cared for by a hospitalist during your hospital stay. Once you are discharged, your primary care physician will handle any further medical issues. Please note that NO REFILLS for any discharge medications will be authorized once you are discharged, as it is imperative that you return to your primary care physician (or establish a relationship with a primary care physician if you do not have one) for your post hospital discharge needs so that they can reassess your need for medications and monitor your lab values.  Time coordinating discharge: 40 minutes  SIGNED:  Marzetta Board, MD, PhD 04/10/2020, 10:24 AM

## 2020-04-10 NOTE — Progress Notes (Signed)
PHARMACY CONSULT NOTE FOR:  OUTPATIENT  PARENTERAL ANTIBIOTIC THERAPY (OPAT)  Indication: Bacteremia Regimen: Ceftriaxone 2 gm IV q24 End date: 04/20/2020  IV antibiotic discharge orders are pended. To discharging provider:  please sign these orders via discharge navigator,  Select New Orders & click on the button choice - Manage This Unsigned Work.     Thank you for allowing pharmacy to be a part of this patient's care.   Eudelia Bunch, Pharm.D 04/10/2020 9:41 AM

## 2020-04-10 NOTE — Progress Notes (Signed)
Pt stable at time of d/c instructions and education. No needs at time of d/c.

## 2020-04-10 NOTE — Plan of Care (Signed)
Pt stable at this time. Pt to d/c home with family. No needs at this time.

## 2020-04-10 NOTE — Progress Notes (Signed)
IV team with pt. Pt to d/c home with chest port accessed. Pt to receive iv antibiotics at home through her port. Pt medications returned to her as well prior to d/c. Pt with stable ambulation in her room. No needs at this time. Rn to go over d/c instructions and education. Rn will continue to monitor until d/c.

## 2020-04-11 DIAGNOSIS — R7881 Bacteremia: Secondary | ICD-10-CM | POA: Diagnosis not present

## 2020-04-11 DIAGNOSIS — A327 Listerial sepsis: Secondary | ICD-10-CM | POA: Diagnosis not present

## 2020-04-11 LAB — CULTURE, BLOOD (ROUTINE X 2)
Culture: NO GROWTH
Special Requests: ADEQUATE

## 2020-04-12 ENCOUNTER — Inpatient Hospital Stay: Payer: BC Managed Care – PPO

## 2020-04-13 DIAGNOSIS — R7881 Bacteremia: Secondary | ICD-10-CM | POA: Diagnosis not present

## 2020-04-13 DIAGNOSIS — A327 Listerial sepsis: Secondary | ICD-10-CM | POA: Diagnosis not present

## 2020-04-14 DIAGNOSIS — R7881 Bacteremia: Secondary | ICD-10-CM | POA: Diagnosis not present

## 2020-04-14 LAB — MISC LABCORP TEST (SEND OUT): Labcorp test code: 8680

## 2020-04-16 LAB — CULTURE, BLOOD (SINGLE): Special Requests: ADEQUATE

## 2020-04-18 ENCOUNTER — Telehealth: Payer: Self-pay

## 2020-04-18 NOTE — Telephone Encounter (Signed)
Patient currently being treated for bactermia with IV ceftriaxone therapy which ends on 04/19/20.  Patient concerned that ulcers are now back in her month and she had a fever of 100.3 this AM and she took some Advil at the time. Her temp now is 100.9 and she will take some Tylenol. Patient was treated in the hospital for mouth ulcers with magic mouth wash for 7 days. Patient scheduled for follow up appointment tomorrow with Terri Piedra, NP

## 2020-04-18 NOTE — Telephone Encounter (Signed)
Noted increased oral lesions and low grade temperatures. Appears her bacteria was resistance to ceftriaxone resulted on 7/15. Will see and make treatment recommendations at office visit tomorrow.

## 2020-04-19 ENCOUNTER — Ambulatory Visit (INDEPENDENT_AMBULATORY_CARE_PROVIDER_SITE_OTHER): Payer: BC Managed Care – PPO | Admitting: Family

## 2020-04-19 ENCOUNTER — Telehealth: Payer: Self-pay | Admitting: *Deleted

## 2020-04-19 ENCOUNTER — Other Ambulatory Visit: Payer: Self-pay | Admitting: Medical

## 2020-04-19 ENCOUNTER — Other Ambulatory Visit: Payer: Self-pay | Admitting: Hematology and Oncology

## 2020-04-19 ENCOUNTER — Other Ambulatory Visit: Payer: Self-pay

## 2020-04-19 ENCOUNTER — Encounter: Payer: Self-pay | Admitting: Family

## 2020-04-19 VITALS — BP 115/75 | HR 103 | Temp 98.7°F | Wt 196.0 lb

## 2020-04-19 DIAGNOSIS — R7881 Bacteremia: Secondary | ICD-10-CM

## 2020-04-19 MED ORDER — SODIUM CHLORIDE FLUSH 0.9 % IV SOLN: INTRAVENOUS | 1 refills | Status: DC

## 2020-04-19 NOTE — Progress Notes (Signed)
Subjective:    Patient ID: [name], female    DOB: March 27, 1971, 49 y.o.   MRN: [id number]  Chief Complaint  Patient presents with   Fever    Patient complains of fever and mouth ulcers. Onset 04/18/20. Fevers have been ranging from 100.9-101.9. Patient has been taking OTC advil and  tylenol for fevers.     HPI:  [name] is a 49 y.o. 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 Plan of care was ceftriaxone for 2 weeks. This was complicated by mouth ulcers of unclear origin. Here today for worsening fever and oral ulcers. Recent culture results show resistance to ceftriaxone.   Since leaving the hospital she initially improved and then in the last couple of nights began feeling flush and uncomfortable with the development of fevers ranging from 100.9-101.9. Has been taking Tylenol and ibuprofen as needed for fever. Continues to have a port that is accessed. Has not been out of the house. She has been receiving her ceftriaxone daily.  Oral lesions described as deep in her throat and not able to be seen but have worsened since leaving the hospital over the last couple days.  Port appears clean and dry with no evidence of infection.   Allergies  Allergen Reactions   Mushroom Extract Complex Anaphylaxis   Na Ferric Gluc Cplx In Sucrose Anaphylaxis   Cymbalta [Duloxetine Hcl] Swelling and Anxiety   Succinylcholine Other (See Comments)    Lock Jaw   Buprenorphine Hcl Hives   Compazine Other (See Comments)    Altered mental status Aggression   Metoclopramide Other (See  Comments)    Dystonia   Morphine And Related Hives   Ondansetron Hives and Rash   Promethazine Hcl Hives   Tegaderm Ag Mesh [Silver] Rash    Old formulation only, is able to tolerate new formulation      Outpatient Medications Prior to Visit  Medication Sig Dispense Refill   albuterol (2.5 MG/3ML) 0.083% NEBU 3 mL, albuterol (5 MG/ML) 0.5% NEBU 0.5 mL Inhale 3 mg into the lungs every 6 (six) hours as needed (SOB).      albuterol (VENTOLIN HFA) 108 (90 Base) MCG/ACT inhaler Inhale 2 puffs into the lungs every 6 (six) hours as needed for wheezing or shortness of breath.     Alum & Mag Hydroxide-Simeth (GI COCKTAIL) SUSP suspension Take 30 mLs daily as needed by mouth for indigestion (BLEEDING ULCERS). Shake well.     aspirin 325 MG tablet Take 325 mg by mouth at bedtime.      beclomethasone (QVAR) 80 MCG/ACT inhaler Inhale 1 puff into the lungs 2 (two) times daily.      benzonatate (TESSALON) 200 MG capsule Take 1 capsule (200 mg total) by mouth 3 (three) times daily as needed for cough. 45 capsule 1   cefTRIAXone (ROCEPHIN) IVPB Inject 2 g into the vein daily for 10 days. Indication:  Bacteremia First Dose: Yes Last Day of Therapy:  04/20/2020  Labs - Once weekly:  CBC/D and BMP, Labs - Every other week:  ESR and CRP Method of administration: IV Push Method of administration may be changed at  the discretion of home infusion pharmacist based upon assessment of the patient and/or caregiver's ability to self-administer the medication ordered. 10 Units 0   clonazePAM (KLONOPIN) 1 MG tablet Take 1 mg by mouth 2 (two) times daily.      colchicine 0.6 MG tablet Take 0.6 mg by mouth daily in the afternoon.      dexlansoprazole (DEXILANT) 60 MG capsule Take 60 mg by mouth daily.       diphenhydrAMINE (BENADRYL) 50 MG/ML injection Inject 1 mL (50 mg total) into the vein every 6 (six) hours as needed (nausea). 25 mL 0   dronabinol (MARINOL) 2.5 MG capsule Take 1 capsule (2.5 mg  total) by mouth 2 (two) times daily as needed (nausea). 30 capsule 0   EPINEPHRINE 0.3 mg/0.3 mL IJ SOAJ injection INJECT 0.3 MLS (0.3 MG TOTAL) INTO THE MUSCLE AS NEEDED FOR ANAPHYLAXIS. 2 each 3   escitalopram (LEXAPRO) 20 MG tablet Take 20 mg by mouth every evening.   1   fluticasone furoate-vilanterol (BREO ELLIPTA) 200-25 MCG/INH AEPB Inhale 1 puff into the lungs daily.     furosemide (LASIX) 40 MG tablet Take 1 tablet (40 mg total) by mouth in the morning and at bedtime. 60 tablet 0   Galcanezumab-gnlm 120 MG/ML SOAJ Inject 120 mg into the skin every 28 (twenty-eight) days.      levothyroxine (SYNTHROID, LEVOTHROID) 112 MCG tablet Take 112 mcg by mouth daily before breakfast.     Mepolizumab (NUCALA) 100 MG SOLR Inject 100 mg into the skin every 28 (twenty-eight) days. 1 each 3   metoprolol succinate (TOPROL-XL) 25 MG 24 hr tablet Take 37.5 mg by mouth daily.      OXYGEN Inhale 4 L into the lungs continuous.      Rimegepant Sulfate (NURTEC) 75 MG TBDP Take 75 mg by mouth daily as needed (Migraine).      rosuvastatin (CRESTOR) 10 MG tablet Take 10 mg by mouth daily.      silver sulfADIAZINE (SILVADENE) 1 % cream Apply 1 application topically daily as needed (Open Wound).      sodium chloride flush 0.9 % SOLN injection 10 mL syringes - #30 count with 1 refill 300 mL 1   sucralfate (CARAFATE) 1 g tablet Take 1 g by mouth daily as needed (FOR ULCERS).      topiramate (TOPAMAX) 50 MG tablet Take 150 mg by mouth at bedtime.      zolpidem (AMBIEN CR) 12.5 MG CR tablet Take 12.5 mg by mouth at bedtime.       estradiol (ESTRACE) 2 MG tablet Take 2 mg by mouth See admin instructions. Takes 1 tablet by mouth every 3 days. (Patient not taking: Reported on 04/19/2020)  11   Heparin Lock Flush (HEPARIN NICU/SCN FLUSH) 1 UNIT/ML SOLN Heparin Lock Flush Solution - 500 USP UNITS/5 mL  #30 pack with 1 refill (Patient not taking: Reported on 04/19/2020) 5 mL 1   hydrocortisone (CORTEF) 10 MG  tablet Take 1-2 tablets (10-20 mg total) by mouth See admin instructions. Take 20 mg in the am and 76m in the evening (Patient taking differently: Take 10-20 mg by mouth See admin instructions. Take 20 mg by mouth in the morning and 139min the evening)     Facility-Administered Medications Prior to Visit  Medication Dose Route Frequency Provider Last Rate Last Admin   Mepolizumab SOLR 100 mg  100 mg Subcutaneous Q28 days ElMargaretha SeedsMD   100 mg at 08/31/19 1017  Past Medical History:  Diagnosis Date   Addison's disease ([place])    Adrenal insufficiency ([place])    Anemia    Anxiety    Aortic stenosis    Aortic stenosis    Appendicitis 12/19/09   Appendicitis    Breast cancer (HCC)    STATUS POST BILATERAL MASTECTOMY. STATUS POST RECONSTRUCTION. SHE HAD SILICONE BREAST IMPLANTS AND THE LEFT IMPLANT IS LEAKING SLIGHTLY   Cellulitis of right middle finger 11/07/2018   Cervical cancer ([place]) 12/23/2018   Chest pain    CHF with right heart failure ([place]) 04/17/2017   Chronic respiratory failure with hypoxia (HCC) 12/23/2018   Cough variant asthma 04/13/2019   Depression    GERD (gastroesophageal reflux disease)    Headache    migraines   Heart murmur    History of kidney stones    Hodgkin lymphoma ([place])    STATUS POST MANTLE RADIATION   Hodgkin's lymphoma ([place])    1987   Hypertension    Hypoxia    Necrotizing fasciitis ([place]) 12/23/2018   Non-ischemic cardiomyopathy ([place])    Osteoporosis    Palpitations    Pituitary adenoma ([place]) 12/23/2018   Pneumonia    PONV (postoperative nausea and vomiting)    Pre-diabetes    per pt; no meds   Pulmonary hypertension ([place]) 12/23/2018   Raynaud phenomenon    Right heart failure ([place]) 04/17/2017   Supplemental oxygen dependent    3 liters   SVT (supraventricular tachycardia) (HCC)    Tachycardia    Thyroid cancer ([place])    STATUS POST SURGICAL REMOVAL-CURRENT ON THYROID REPLACEMENT      Past  Surgical History:  Procedure Laterality Date   ABDOMINAL HYSTERECTOMY     AMPUTATION Left 01/30/2019   Procedure: Left Index finger amputation with flap reconstruction and repair reconstruction;  Surgeon: Roseanne Kaufman, MD;  Location: [facility];  Service: Orthopedics;  Laterality: Left;   APPENDECTOMY     CARDIAC CATHETERIZATION  05/18/09   NORMAL CATH   I & D EXTREMITY Left 12/23/2018   Procedure: IRRIGATION AND DEBRIDEMENT HAND / INDEX FINGER;  Surgeon: Roseanne Kaufman, MD;  Location: [facility];  Service: Orthopedics;  Laterality: Left;   MASTECTOMY     PITUITARY SURGERY     RIGHT/LEFT HEART CATH AND CORONARY ANGIOGRAPHY N/A 04/02/2018   Procedure: RIGHT/LEFT HEART CATH AND CORONARY ANGIOGRAPHY;  Surgeon: Burnell Blanks, MD;  Location: [facility];  Service: Cardiovascular;  Laterality: N/A;   TOTAL THYROIDECTOMY     VIDEO BRONCHOSCOPY Bilateral 11/14/2018   Procedure: VIDEO BRONCHOSCOPY WITHOUT FLUORO;  Surgeon: Margaretha Seeds, MD;  Location: [facility];  Service: Cardiopulmonary;  Laterality: Bilateral;   VIDEO BRONCHOSCOPY WITH ENDOBRONCHIAL ULTRASOUND N/A 11/19/2018   Procedure: VIDEO BRONCHOSCOPY WITH ENDOBRONCHIAL ULTRASOUND;  Surgeon: Margaretha Seeds, MD;  Location: [facility];  Service: Thoracic;  Laterality: N/A;      Family History  Family history unknown: Yes      Social History   Socioeconomic History   Marital status: Married    Spouse name: Not on file   Number of children: Not on file   Years of education: Not on file   Highest education level: Not on file  Occupational History   Not on file  Tobacco Use   Smoking status: Never Smoker   Smokeless tobacco: Never Used  Vaping Use   Vaping Use: Never used  Substance and Sexual Activity   Alcohol use: Yes    Comment: social  Drug use: No   Sexual activity: Yes    Birth control/protection: Surgical  Other Topics Concern   Not on file  Social History Narrative   Not on file    Social Determinants of Health   Financial Resource Strain:    Difficulty of Paying Living Expenses:   Food Insecurity:    Worried About Charity fundraiser in the Last Year:    Arboriculturist in the Last Year:   Transportation Needs:    Film/video editor (Medical):    Lack of Transportation (Non-Medical):   Physical Activity:    Days of Exercise per Week:    Minutes of Exercise per Session:   Stress:    Feeling of Stress :   Social Connections:    Frequency of Communication with Friends and Family:    Frequency of Social Gatherings with Friends and Family:    Attends Religious Services:    Active Member of Clubs or Organizations:    Attends Music therapist:    Marital Status:   Intimate Partner Violence:    Fear of Current or Ex-Partner:    Emotionally Abused:    Physically Abused:    Sexually Abused:       Review of Systems  Constitutional: Positive for chills, fatigue and fever. Negative for diaphoresis.  HENT: Positive for sore throat.   Respiratory: Negative for cough, chest tightness, shortness of breath and wheezing.   Cardiovascular: Positive for leg swelling. Negative for chest pain.  Gastrointestinal: Negative for abdominal pain, diarrhea, nausea and vomiting.       Objective:    BP 115/75    Pulse (!) 103    Temp 98.7 F (37.1 C) (Oral)    Wt 196 lb (88.9 kg)    LMP  (LMP Unknown)    BMI 32.62 kg/m  Nursing note and vital signs reviewed.  Physical Exam Constitutional:      General: She is not in acute distress.    Appearance: She is well-developed. She is ill-appearing.     Comments: Seated in the chair; pleasant and tearful at times.  Cardiovascular:     Rate and Rhythm: Normal rate and regular rhythm.     Heart sounds: Normal heart sounds.  Pulmonary:     Effort: Pulmonary effort is normal.     Breath sounds: Normal breath sounds.  Skin:    General: Skin is warm and dry.  Neurological:     Mental  Status: She is alert and oriented to person, place, and time.  Psychiatric:        Mood and Affect: Mood is anxious.         Assessment & Plan:   Patient Active Problem List   Diagnosis Date Noted   Bacteremia 04/19/2020   Addison's disease ([place])    Nausea 04/01/2020   Generalized weakness 02/10/2020   Headache 02/10/2020   Subcutaneous nodule 02/01/2020   Port-A-Cath in place 09/09/2019   Moderate persistent asthma without complication 58/52/7782   Acute superficial venous thrombosis of lower extremity, right 07/16/2019   Injury of left index finger 12/24/2018   Pituitary adenoma ([place]) 12/23/2018   History of cervical cancer 12/23/2018   Chronic respiratory failure with hypoxia (HCC) 12/23/2018   SOB (shortness of breath)    Nephrolithiasis 11/07/2018   Oxygen dependent 11/07/2018   Migraine 11/07/2018   PVC's (premature ventricular contractions) 10/27/2018   Hyperlipidemia 03/09/2014   Hx of Hodgkins lymphoma    History of ductal  carcinoma in situ (DCIS) of breast    Thyroid cancer ([place])    Adrenal insufficiency (HCC)    Hypertension    Raynaud phenomenon    GERD (gastroesophageal reflux disease)      Problem List Items Addressed This Visit      Other   Bacteremia - Primary    Ms. Blatter is a 49 year old female with fever and weakness previously found to have  sphingomonas paucimobilis bacteremia initially started on ceftriaxone with sensitivities found to be resistant now presenting with new onset fever.  Change ceftriaxone to ertapenem.  Will need 14 days of antibiotic therapy.  Port does not appear to be infected.  Ulcers unable to be examined but appear to improve with antimicrobial therapy previously and likely to be the same this time.  Notified home health of antibiotic change and end date of 8/4.  Flushes ordered per patient request.  Plan for follow-up in 2 weeks or sooner if needed.  Given her immune compromised state would have low  threshold for return to hospital if symptoms worsen.          I am having Susan Ohms. Rollene Rotunda "[name]" maintain her dexlansoprazole, zolpidem, estradiol, beclomethasone, albuterol, escitalopram, sucralfate, gi cocktail, metoprolol succinate, rosuvastatin, levothyroxine, clonazePAM, diphenhydrAMINE, Galcanezumab-gnlm, benzonatate, fluticasone furoate-vilanterol, Nucala, (albuterol (2.5 MG/3ML) 0.083% NEBU 3 mL, albuterol (5 MG/ML) 0.5% NEBU 0.5 mL), OXYGEN, Nurtec, aspirin, EPINEPHrine, colchicine, silver sulfADIAZINE, topiramate, sodium chloride flush, heparin NICU/SCN flush, hydrocortisone, cefTRIAXone, furosemide, and dronabinol. We will continue to administer Mepolizumab.   Follow-up: Return in about 2 weeks (around 05/03/2020).    Terri Piedra, MSN, FNP-C Nurse Practitioner [facility] for [facility] [place] number: [phone]

## 2020-04-19 NOTE — Telephone Encounter (Signed)
Please review.  Pt is requesting we prescribe NS flushes for her when she accesses and de accesses her port a cath at home. Thoughts?

## 2020-04-19 NOTE — Telephone Encounter (Signed)
Relayed new antibiotic infusion orders per Terri Piedra, NP, to [facility] at [place] - Ertapenem 1 gm daily x 14 doses.  Same labs as previously ordered. Patient has [facility] care, can do first dose at home.  Patient notified.

## 2020-04-19 NOTE — Assessment & Plan Note (Addendum)
Ms. Selkirk is a 49 year old female with fever and weakness previously found to have  sphingomonas paucimobilis bacteremia initially started on ceftriaxone with sensitivities found to be resistant now presenting with new onset fever.  Change ceftriaxone to ertapenem.  Will need 14 days of antibiotic therapy.  Port does not appear to be infected.  Ulcers unable to be examined but appear to improve with antimicrobial therapy previously and likely to be the same this time.  Notified home health of antibiotic change and end date of 8/4.  Flushes ordered per patient request.  Plan for follow-up in 2 weeks or sooner if needed.  Given her immune compromised state would have low threshold for return to hospital if symptoms worsen.

## 2020-04-19 NOTE — Patient Instructions (Signed)
Nice to meet you.  We will change your medication to Ertapenem for 2 weeks.   A prescription for flushes will be sent to [facility].  Plan for follow up in 2 weeks with Dr. Baxter Flattery.

## 2020-04-20 ENCOUNTER — Other Ambulatory Visit: Payer: Self-pay | Admitting: Family

## 2020-04-20 ENCOUNTER — Other Ambulatory Visit: Payer: Self-pay

## 2020-04-20 ENCOUNTER — Inpatient Hospital Stay: Payer: BC Managed Care – PPO

## 2020-04-20 ENCOUNTER — Telehealth: Payer: Self-pay | Admitting: *Deleted

## 2020-04-20 ENCOUNTER — Ambulatory Visit: Payer: Self-pay | Admitting: Family

## 2020-04-20 VITALS — BP 122/72 | HR 102 | Temp 98.7°F | Resp 17

## 2020-04-20 DIAGNOSIS — A327 Listerial sepsis: Secondary | ICD-10-CM | POA: Diagnosis not present

## 2020-04-20 DIAGNOSIS — Z95828 Presence of other vascular implants and grafts: Secondary | ICD-10-CM

## 2020-04-20 DIAGNOSIS — R7881 Bacteremia: Secondary | ICD-10-CM | POA: Diagnosis not present

## 2020-04-20 DIAGNOSIS — Z8571 Personal history of Hodgkin lymphoma: Secondary | ICD-10-CM | POA: Diagnosis not present

## 2020-04-20 MED ORDER — SODIUM CHLORIDE 0.9 % IV SOLN
INTRAVENOUS | Status: DC
  Filled 2020-04-20: qty 250

## 2020-04-20 MED ORDER — SODIUM CHLORIDE 0.9 % IV SOLN
1.0000 g | Freq: Once | INTRAVENOUS | Status: AC
  Administered 2020-04-20: 1000 mg via INTRAVENOUS
  Filled 2020-04-20: qty 1

## 2020-04-20 MED ORDER — HEPARIN SOD (PORK) LOCK FLUSH 100 UNIT/ML IV SOLN
500.0000 [IU] | Freq: Once | INTRAVENOUS | Status: AC | PRN
  Administered 2020-04-20: 500 [IU]
  Filled 2020-04-20: qty 5

## 2020-04-20 MED ORDER — DIPHENHYDRAMINE HCL 50 MG/ML IJ SOLN
50.0000 mg | Freq: Once | INTRAMUSCULAR | Status: AC
  Administered 2020-04-20: 50 mg via INTRAVENOUS

## 2020-04-20 MED ORDER — SODIUM CHLORIDE 0.9% FLUSH
10.0000 mL | INTRAVENOUS | Status: DC | PRN
  Administered 2020-04-20: 10 mL
  Filled 2020-04-20: qty 10

## 2020-04-20 MED ORDER — DIPHENHYDRAMINE HCL 50 MG/ML IJ SOLN
INTRAMUSCULAR | Status: AC
  Filled 2020-04-20: qty 1

## 2020-04-20 MED ORDER — ERTAPENEM SODIUM 1 G IJ SOLR: 1.0000 g | Freq: Once | INTRAMUSCULAR | Status: DC

## 2020-04-20 NOTE — Patient Instructions (Signed)
Ertapenem Injection What is this medicine? ERTAPENEM (er ta PEN em) is a carbapenem antibiotic. It treats some infections caused by bacteria. It will not work for colds, the flu, or other viruses. This medicine may be used for other purposes; ask your health care provider or pharmacist if you have questions. COMMON BRAND NAME(S): Invanz What should I tell my health care provider before I take this medicine? They need to know if you have any of these conditions:  brain tumor, lesion  kidney disease  seizure disorder  an unusual or allergic reaction to ertapenem, other antibiotics, amide local anesthetics like lidocaine, or other medicines, foods, dyes, or preservatives  pregnant or trying to get pregnant  breast-feeding How should I use this medicine? This drug is injected into a muscle or a vein. It is usually given by a health care provider in a hospital or clinic setting. If you get this drug at home, you will be taught how to prepare and give it. Use exactly as directed. Take it as directed on the prescription label at the same time every day. Keep taking it unless your health care provider tells you to stop. It is important that you put your used needles and syringes in a special sharps container. Do not put them in a trash can. If you do not have a sharps container, call your pharmacist or health care provider to get one. Talk to your health care provider about the use of this drug in children. While it may be prescribed for children as young as 3 months for selected conditions, precautions do apply. Overdosage: If you think you have taken too much of this medicine contact a poison control center or emergency room at once. NOTE: This medicine is only for you. Do not share this medicine with others. What if I miss a dose? It is important not to miss your dose. Call your health care provider if you are unable to keep an appointment. If you give yourself this drug at home and you miss a  dose, take it as soon as you can. If it is almost time for your next dose, take only that dose. Do not take double or extra doses. What may interact with this medicine?  birth control pills  probenecid This list may not describe all possible interactions. Give your health care provider a list of all the medicines, herbs, non-prescription drugs, or dietary supplements you use. Also tell them if you smoke, drink alcohol, or use illegal drugs. Some items may interact with your medicine. What should I watch for while using this medicine? Tell your doctor or health care provider if your symptoms do not improve or if you get new symptoms. Your doctor will monitor your condition and blood work as needed. This medicine may cause serious skin reactions. They can happen weeks to months after starting the medicine. Contact your health care provider right away if you notice fevers or flu-like symptoms with a rash. The rash may be red or purple and then turn into blisters or peeling of the skin. Or, you might notice a red rash with swelling of the face, lips or lymph nodes in your neck or under your arms. Do not treat diarrhea with over the counter products. Contact your doctor if you have diarrhea that lasts more than 2 days or if it is severe and watery. What side effects may I notice from receiving this medicine? Side effects that you should report to your doctor or health care professional  as soon as possible:  allergic reactions like skin rash, itching or hives, swelling of the face, lips, or tongue  anxiety, confusion, dizzy  chest pain  difficulty breathing, wheezing  edema, swelling  fever  irregular heart rate, blood pressure  pain or difficulty passing urine  redness, blistering, peeling, or loosening of the skin, including inside the mouth  seizures  unusually weak or tired  white or red patches in mouth Side effects that usually do not require medical attention (report to your  doctor or health care professional if they continue or are bothersome):  constipation or diarrhea  difficulty sleeping  headache  nausea, vomiting  pain, swelling or irritation where injected  stomach upset  vaginal itch, irritation This list may not describe all possible side effects. Call your doctor for medical advice about side effects. You may report side effects to FDA at 1-800-FDA-1088. Where should I keep my medicine? Keep out of the reach of children and pets. You will be instructed on how to store this drug. Throw away any unused drug after the expiration date. NOTE: This sheet is a summary. It may not cover all possible information. If you have questions about this medicine, talk to your doctor, pharmacist, or health care provider.  2020 Elsevier/Gold Standard (2019-04-24 14:43:37)

## 2020-04-20 NOTE — Telephone Encounter (Signed)
Patient would prefer to skip labs this week. OK per Marya Amsler.  RN relayed verbal order to Coretta at [facility]. Landis Gandy, RN

## 2020-04-20 NOTE — Telephone Encounter (Signed)
Nursing unable to get to patient's house today for infusion.  OK per Marya Amsler and per cancer center to administer ertapenem while she is there for port flush. Patient aware, home health aware. Landis Gandy, RN

## 2020-04-20 NOTE — Progress Notes (Unsigned)
e

## 2020-04-20 NOTE — Telephone Encounter (Signed)
RN spoke with patient this morning. She asked for assistance making appointment at [facility] for port access change (scheduled per Mickel Baas at 1:00) and to get specifics on port access for home health to see if they can order (19g 1" bard).  RN relayed information to [place] at [facility].  She will see if she can order this for patient. Per Stanton Kidney, Insurance approved ertapenem, $0 copay. Will send first dose today, waiting on confirmation with [facility].  [name] will contact her nurse as well.  She will call back with any issues. Landis Gandy, RN

## 2020-04-21 DIAGNOSIS — R7881 Bacteremia: Secondary | ICD-10-CM | POA: Diagnosis not present

## 2020-04-21 DIAGNOSIS — A327 Listerial sepsis: Secondary | ICD-10-CM | POA: Diagnosis not present

## 2020-04-21 NOTE — Progress Notes (Signed)
[name]

## 2020-04-22 ENCOUNTER — Other Ambulatory Visit: Payer: Self-pay | Admitting: Hematology and Oncology

## 2020-04-22 ENCOUNTER — Telehealth: Payer: Self-pay | Admitting: Infectious Disease

## 2020-04-22 DIAGNOSIS — R110 Nausea: Secondary | ICD-10-CM

## 2020-04-22 NOTE — Telephone Encounter (Signed)
Patient fevering through the treatment with ertapenem  She has sphingomonas bacteremia assoc with line w attempts to treat through the line due to poor IV access, sent home on Ceftriaxone to which org was R change to invanz though I am NOT CONFIDENT Colbert Ewing is active either  Meropenem is active   Regardless of activity of antibiotic  Bigger issue is need to likely remove line  Advised to come to ER for admssion  Check blood cultures  Change to merrem  I can see formally after rounding at [facility] tomorrow.

## 2020-04-23 ENCOUNTER — Telehealth: Payer: Self-pay | Admitting: Infectious Disease

## 2020-04-23 NOTE — Telephone Encounter (Signed)
Patient went to ER but had 5 hour wait and she was NOT comfortable waiting given problems with her immune system  Feels better today fever gone  I am changing her to Merrem 2 g IV q 8  If fevers recur on merrem she needs line to come out

## 2020-04-24 DIAGNOSIS — R7881 Bacteremia: Secondary | ICD-10-CM | POA: Diagnosis not present

## 2020-04-24 DIAGNOSIS — A327 Listerial sepsis: Secondary | ICD-10-CM | POA: Diagnosis not present

## 2020-04-25 NOTE — Telephone Encounter (Addendum)
Spoke with patient. She is starting her 4th dose, still feels poorly, but "not at the ambulance point."  She feels feverish (99.9 this morning), has ulcers throughout her mouth making eating difficult. She has a migraine today, said she feels "out of it" and thinks her hands are puffier than normal.  She is home alone, as her son and husband went at her bidding on a previously planned trip.  Patient does not want to go to the ER at this point, would like to give it until Tuesday morning for the medication to start to work.  She will contact a friend from church to come sit with her so that she is not alone.  She asks about the chance of a direct admission from clinic, as suggested by Dr Tommy Medal over the weekend, but does not feel it is as bad as this weekend. RN spoke with Janene Madeira, NP.  Per [facility], there are no beds available for several hours. They are still trying to room patients from yesterday's requests. Colletta Maryland is ok with patient monitoring at home until tomorrow to see if her symptoms are improving, as long as she is not alone.  Patient ok with this plan, will need additional doses sent over from [facility]. RN spoke with Melissa at [facility] to let her know of the plan. [facility] will send a couple days' worth today. Landis Gandy, RN

## 2020-04-25 NOTE — Telephone Encounter (Signed)
Called patient this morning, left voicemail asking her to call back and let us know how she is doing. Landis Gandy, RN

## 2020-04-25 NOTE — Telephone Encounter (Signed)
Thanks [employer]

## 2020-04-26 ENCOUNTER — Other Ambulatory Visit: Payer: Self-pay

## 2020-04-26 ENCOUNTER — Inpatient Hospital Stay (HOSPITAL_COMMUNITY)
Admission: EM | Admit: 2020-04-26 | Discharge: 2020-04-29 | DRG: 315 | Disposition: A | Discharge status: Home Health | Payer: BC Managed Care – PPO | Attending: Internal Medicine | Admitting: Internal Medicine

## 2020-04-26 ENCOUNTER — Emergency Department (HOSPITAL_COMMUNITY): Payer: BC Managed Care – PPO

## 2020-04-26 ENCOUNTER — Telehealth: Payer: Self-pay | Admitting: *Deleted

## 2020-04-26 DIAGNOSIS — Z9013 Acquired absence of bilateral breasts and nipples: Secondary | ICD-10-CM

## 2020-04-26 DIAGNOSIS — I110 Hypertensive heart disease with heart failure: Secondary | ICD-10-CM | POA: Diagnosis not present

## 2020-04-26 DIAGNOSIS — J4540 Moderate persistent asthma, uncomplicated: Secondary | ICD-10-CM | POA: Diagnosis present

## 2020-04-26 DIAGNOSIS — I7300 Raynaud's syndrome without gangrene: Secondary | ICD-10-CM | POA: Diagnosis present

## 2020-04-26 DIAGNOSIS — A327 Listerial sepsis: Secondary | ICD-10-CM | POA: Diagnosis not present

## 2020-04-26 DIAGNOSIS — F418 Other specified anxiety disorders: Secondary | ICD-10-CM | POA: Diagnosis present

## 2020-04-26 DIAGNOSIS — M810 Age-related osteoporosis without current pathological fracture: Secondary | ICD-10-CM | POA: Diagnosis present

## 2020-04-26 DIAGNOSIS — Z86718 Personal history of other venous thrombosis and embolism: Secondary | ICD-10-CM

## 2020-04-26 DIAGNOSIS — E7800 Pure hypercholesterolemia, unspecified: Secondary | ICD-10-CM | POA: Diagnosis not present

## 2020-04-26 DIAGNOSIS — E785 Hyperlipidemia, unspecified: Secondary | ICD-10-CM | POA: Diagnosis not present

## 2020-04-26 DIAGNOSIS — N179 Acute kidney failure, unspecified: Secondary | ICD-10-CM | POA: Diagnosis not present

## 2020-04-26 DIAGNOSIS — G43909 Migraine, unspecified, not intractable, without status migrainosus: Secondary | ICD-10-CM | POA: Diagnosis present

## 2020-04-26 DIAGNOSIS — B9689 Other specified bacterial agents as the cause of diseases classified elsewhere: Secondary | ICD-10-CM | POA: Diagnosis not present

## 2020-04-26 DIAGNOSIS — Z85850 Personal history of malignant neoplasm of thyroid: Secondary | ICD-10-CM | POA: Diagnosis not present

## 2020-04-26 DIAGNOSIS — R911 Solitary pulmonary nodule: Secondary | ICD-10-CM | POA: Diagnosis not present

## 2020-04-26 DIAGNOSIS — E2740 Unspecified adrenocortical insufficiency: Secondary | ICD-10-CM | POA: Diagnosis not present

## 2020-04-26 DIAGNOSIS — Z95828 Presence of other vascular implants and grafts: Secondary | ICD-10-CM | POA: Diagnosis not present

## 2020-04-26 DIAGNOSIS — Z86018 Personal history of other benign neoplasm: Secondary | ICD-10-CM | POA: Diagnosis present

## 2020-04-26 DIAGNOSIS — T80219A Unspecified infection due to central venous catheter, initial encounter: Principal | ICD-10-CM | POA: Diagnosis present

## 2020-04-26 DIAGNOSIS — J9611 Chronic respiratory failure with hypoxia: Secondary | ICD-10-CM | POA: Diagnosis present

## 2020-04-26 DIAGNOSIS — Z8672 Personal history of thrombophlebitis: Secondary | ICD-10-CM | POA: Diagnosis not present

## 2020-04-26 DIAGNOSIS — Z9221 Personal history of antineoplastic chemotherapy: Secondary | ICD-10-CM

## 2020-04-26 DIAGNOSIS — D352 Benign neoplasm of pituitary gland: Secondary | ICD-10-CM | POA: Diagnosis present

## 2020-04-26 DIAGNOSIS — Z66 Do not resuscitate: Secondary | ICD-10-CM | POA: Diagnosis not present

## 2020-04-26 DIAGNOSIS — Z87442 Personal history of urinary calculi: Secondary | ICD-10-CM

## 2020-04-26 DIAGNOSIS — T80211A Bloodstream infection due to central venous catheter, initial encounter: Secondary | ICD-10-CM | POA: Diagnosis not present

## 2020-04-26 DIAGNOSIS — Z853 Personal history of malignant neoplasm of breast: Secondary | ICD-10-CM

## 2020-04-26 DIAGNOSIS — I809 Phlebitis and thrombophlebitis of unspecified site: Secondary | ICD-10-CM | POA: Diagnosis present

## 2020-04-26 DIAGNOSIS — I361 Nonrheumatic tricuspid (valve) insufficiency: Secondary | ICD-10-CM | POA: Diagnosis not present

## 2020-04-26 DIAGNOSIS — R509 Fever, unspecified: Secondary | ICD-10-CM | POA: Diagnosis not present

## 2020-04-26 DIAGNOSIS — Z86000 Personal history of in-situ neoplasm of breast: Secondary | ICD-10-CM | POA: Diagnosis not present

## 2020-04-26 DIAGNOSIS — I50810 Right heart failure, unspecified: Secondary | ICD-10-CM | POA: Diagnosis present

## 2020-04-26 DIAGNOSIS — R7881 Bacteremia: Secondary | ICD-10-CM

## 2020-04-26 DIAGNOSIS — Z9981 Dependence on supplemental oxygen: Secondary | ICD-10-CM

## 2020-04-26 DIAGNOSIS — I351 Nonrheumatic aortic (valve) insufficiency: Secondary | ICD-10-CM | POA: Diagnosis not present

## 2020-04-26 DIAGNOSIS — Z8541 Personal history of malignant neoplasm of cervix uteri: Secondary | ICD-10-CM

## 2020-04-26 DIAGNOSIS — Z8571 Personal history of Hodgkin lymphoma: Secondary | ICD-10-CM

## 2020-04-26 DIAGNOSIS — Z79899 Other long term (current) drug therapy: Secondary | ICD-10-CM

## 2020-04-26 DIAGNOSIS — Z20822 Contact with and (suspected) exposure to covid-19: Secondary | ICD-10-CM | POA: Diagnosis not present

## 2020-04-26 DIAGNOSIS — R0602 Shortness of breath: Secondary | ICD-10-CM | POA: Diagnosis not present

## 2020-04-26 DIAGNOSIS — Z452 Encounter for adjustment and management of vascular access device: Secondary | ICD-10-CM | POA: Diagnosis not present

## 2020-04-26 DIAGNOSIS — E039 Hypothyroidism, unspecified: Secondary | ICD-10-CM | POA: Diagnosis present

## 2020-04-26 DIAGNOSIS — Z9882 Breast implant status: Secondary | ICD-10-CM

## 2020-04-26 DIAGNOSIS — Z79890 Hormone replacement therapy (postmenopausal): Secondary | ICD-10-CM

## 2020-04-26 DIAGNOSIS — Z7982 Long term (current) use of aspirin: Secondary | ICD-10-CM | POA: Diagnosis not present

## 2020-04-26 DIAGNOSIS — G4700 Insomnia, unspecified: Secondary | ICD-10-CM | POA: Diagnosis present

## 2020-04-26 DIAGNOSIS — I10 Essential (primary) hypertension: Secondary | ICD-10-CM | POA: Diagnosis present

## 2020-04-26 DIAGNOSIS — I999 Unspecified disorder of circulatory system: Secondary | ICD-10-CM

## 2020-04-26 DIAGNOSIS — Z90710 Acquired absence of both cervix and uterus: Secondary | ICD-10-CM

## 2020-04-26 DIAGNOSIS — E271 Primary adrenocortical insufficiency: Secondary | ICD-10-CM | POA: Diagnosis not present

## 2020-04-26 DIAGNOSIS — N1831 Chronic kidney disease, stage 3a: Secondary | ICD-10-CM | POA: Diagnosis present

## 2020-04-26 LAB — COMPREHENSIVE METABOLIC PANEL
ALT: 39 U/L (ref 0–44)
AST: 29 U/L (ref 15–41)
Albumin: 4.2 g/dL (ref 3.5–5.0)
Alkaline Phosphatase: 64 U/L (ref 38–126)
Anion gap: 11 (ref 5–15)
BUN: 24 mg/dL — ABNORMAL HIGH (ref 6–20)
CO2: 30 mmol/L (ref 22–32)
Calcium: 10 mg/dL (ref 8.9–10.3)
Chloride: 101 mmol/L (ref 98–111)
Creatinine, Ser: 1.31 mg/dL — ABNORMAL HIGH (ref 0.44–1.00)
GFR calc Af Amer: 55 mL/min — ABNORMAL LOW (ref >60)
GFR calc non Af Amer: 48 mL/min — ABNORMAL LOW (ref >60)
Glucose, Bld: 111 mg/dL — ABNORMAL HIGH (ref 70–99)
Potassium: 3.7 mmol/L (ref 3.5–5.1)
Sodium: 142 mmol/L (ref 135–145)
Total Bilirubin: 0.5 mg/dL (ref 0.3–1.2)
Total Protein: 7.9 g/dL (ref 6.5–8.1)

## 2020-04-26 LAB — PROTIME-INR
INR: 1 (ref 0.8–1.2)
Prothrombin Time: 12.5 seconds (ref 11.4–15.2)

## 2020-04-26 LAB — CBC WITH DIFFERENTIAL/PLATELET
Abs Immature Granulocytes: 0.03 10*3/uL (ref 0.00–0.07)
Basophils Absolute: 0.1 10*3/uL (ref 0.0–0.1)
Basophils Relative: 1 %
Eosinophils Absolute: 0.1 10*3/uL (ref 0.0–0.5)
Eosinophils Relative: 2 %
HCT: 38.9 % (ref 36.0–46.0)
Hemoglobin: 12.2 g/dL (ref 12.0–15.0)
Immature Granulocytes: 0 %
Lymphocytes Relative: 28 %
Lymphs Abs: 2.4 10*3/uL (ref 0.7–4.0)
MCH: 29 pg (ref 26.0–34.0)
MCHC: 31.4 g/dL (ref 30.0–36.0)
MCV: 92.4 fL (ref 80.0–100.0)
Monocytes Absolute: 0.8 10*3/uL (ref 0.1–1.0)
Monocytes Relative: 10 %
Neutro Abs: 5.2 10*3/uL (ref 1.7–7.7)
Neutrophils Relative %: 59 %
Platelets: 347 10*3/uL (ref 150–400)
RBC: 4.21 MIL/uL (ref 3.87–5.11)
RDW: 14.6 % (ref 11.5–15.5)
WBC: 8.7 10*3/uL (ref 4.0–10.5)
nRBC: 0 % (ref 0.0–0.2)

## 2020-04-26 LAB — LACTIC ACID, PLASMA
Lactic Acid, Venous: 1.6 mmol/L (ref 0.5–1.9)
Lactic Acid, Venous: 0.9 mmol/L (ref 0.5–1.9)

## 2020-04-26 MED ORDER — VANCOMYCIN HCL 1750 MG/350ML IV SOLN
1750.0000 mg | Freq: Once | INTRAVENOUS | Status: AC
  Administered 2020-04-26: 1750 mg via INTRAVENOUS
  Filled 2020-04-26: qty 350

## 2020-04-26 MED ORDER — DIPHENHYDRAMINE HCL 50 MG/ML IJ SOLN
INTRAMUSCULAR | Status: AC
  Administered 2020-04-26: 50 mg via INTRAVENOUS
  Filled 2020-04-26: qty 1

## 2020-04-26 MED ORDER — HYDROMORPHONE HCL 1 MG/ML IJ SOLN
0.5000 mg | Freq: Once | INTRAMUSCULAR | Status: AC
  Administered 2020-04-26: 0.5 mg via INTRAVENOUS
  Filled 2020-04-26: qty 1

## 2020-04-26 MED ORDER — SODIUM CHLORIDE 0.9 % IV SOLN
1.0000 g | Freq: Once | INTRAVENOUS | Status: AC
  Administered 2020-04-26: 1 g via INTRAVENOUS
  Filled 2020-04-26: qty 1

## 2020-04-26 MED ORDER — DIPHENHYDRAMINE HCL 50 MG/ML IJ SOLN: 50.0000 mg | Freq: Once | INTRAMUSCULAR | Status: AC

## 2020-04-26 MED ORDER — HYDROMORPHONE HCL 1 MG/ML IJ SOLN: 0.5000 mg | Freq: Once | INTRAMUSCULAR | Status: AC

## 2020-04-26 MED ORDER — HYDROMORPHONE HCL 1 MG/ML IJ SOLN
INTRAMUSCULAR | Status: AC
  Administered 2020-04-26: 0.5 mg via INTRAVENOUS
  Filled 2020-04-26: qty 1

## 2020-04-26 MED ORDER — DIPHENHYDRAMINE HCL 50 MG/ML IJ SOLN
25.0000 mg | Freq: Once | INTRAMUSCULAR | Status: AC
  Administered 2020-04-26: 25 mg via INTRAVENOUS
  Filled 2020-04-26: qty 1

## 2020-04-26 MED ORDER — SODIUM CHLORIDE 0.9 % IV BOLUS
1000.0000 mL | Freq: Once | INTRAVENOUS | Status: AC
  Administered 2020-04-26: 1000 mL via INTRAVENOUS

## 2020-04-26 NOTE — ED Triage Notes (Signed)
Patient reports to the ER for sepsis. Infectious disease called her and told her to get to the ER. Patient reports she has tried multiple antibiotics outpatient and has failed therapy.

## 2020-04-26 NOTE — Telephone Encounter (Signed)
-----  Message from [place], MD sent at 04/25/2020  8:41 AM EDT ----- Regarding: RE: oxygen Did the inpatient pulm team contact the office to have her scheduled for follow up ?  JE ----- Message ----- From: Amado Coe, RN Sent: 04/07/2020   2:13 PM EDT To: Melvenia Needles, NP, Juanito Doom, MD, # Subject: RE: oxygen                                     Once schedule is laid out I will schedule her on a day . Is there a expected discharge date? To make the 3-4 weeks out follow up? ----- Message ----- From: Margaretha Seeds, MD Sent: 04/07/2020  12:08 PM EDT To: Melvenia Needles, NP, Amado Coe, RN, # Subject: RE: oxygen                                     Hi all,  I discussed patient's case with Dr. Lake Bells. She is currently admitted to [facility]. After discharge, she can be arranged to see me in clinic for follow-up.   Lasya Vetter, please schedule patient for follow-up with me in in 3-4 weeks in a 30 minute time slot.  Thanks, Opal Sidles ----- Message ----- From: Amado Coe, RN Sent: 04/05/2020   3:33 PM EDT To: Melvenia Needles, NP, Juanito Doom, MD, # Subject: oxygen                                         Good afternoon,  This patient have been walked in office visits a few times. We even brought her in for a 6 min walk where she "passed out" or fell or something, cant remember wasn't in office, but her sats do not drop.  I would ask Dr. Loanne Drilling just because of how complicated this patient is and she knows the whole history of this patient.   I added her to the message in case she is checking :-)   Thank you  Saverio Kader ----- Message ----- From: Juanito Doom, MD Sent: 04/05/2020  11:52 AM EDT To: Melvenia Needles, NP, Lauraine Rinne, NP, #  Hi,  This lady says she needs a home oxygen concentrator.   Jane's patient, very complicated.   From my review of the notes, I can't tell if she has ever actually qualified for oxygen on any of our visits (her  oxygen saturation on room air yesterday was fine).   If she has qualified in the past, can we order her a concentrator and humidifier?  However if she has never qualified in the past, don't worry about it.  [place] app team because I suspect they know more about her than I do...  Ruby Cola

## 2020-04-26 NOTE — Progress Notes (Signed)
A consult was received from an ED physician for vanc/meropenem per pharmacy dosing.  The patient's profile has been reviewed for ht/wt/allergies/indication/available labs.  A one time order has been placed for vanc 1775m and meropenem 1g.  Further antibiotics/pharmacy consults should be ordered by admitting physician if indicated.                       Thank you, LKara Mead7/27/2021  8:12 PM

## 2020-04-26 NOTE — Telephone Encounter (Signed)
Patient called office back today stating she still feels poorly. Took her 5th dose of antibiotics.  States that last night she had a fever of 100.2. Is taking Advil to keep fever down. States that her ulcers are back and are painful. Patient does not have port at this time.  Patient does not want to go to ED due to being immunocompromised . Would like to hear back from Dr. Tommy Medal or Sharyn Lull, RN plan. Will forward message.

## 2020-04-26 NOTE — Telephone Encounter (Signed)
Temp was 102.1 overnight. She is taking advil around the clock to keep her fever down, now 100.2. Her mouth ulcers are preventing her from eating and drinking well. She says she still has energy, is scared to go to the emergency room especially since her husband and son are out of town.  Port deaccessed by patient after her last dose at 1 am ahead of nurse visit today to change port access and dressing. RN advised patient that she should follow Dr Lucianne Lei Dam's previous advice and go to the emergency room for evaluation, as her fevers have not gotten better with the new medication, which might indicate an infected port.  She is upset and scared, but will go to [facility] .  She will take a shower and pack a bag, then will go.

## 2020-04-26 NOTE — Telephone Encounter (Signed)
Ok so she is coming to the hospital. TEam should let Dr Megan Salon know if she is admitted at [facility]

## 2020-04-26 NOTE — H&P (Addendum)
History and Physical    [name] YKD:[id number] DOB: 21-Jun-1971 DOA: 04/26/2020  PCP: Su Grand, MD Patient coming from: Home  Chief Complaint: Blood infection  HPI: [name] is a 49 y.o. 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  Recent history of sphingomonas paucimobilis bacteremia associated with line with attempts to treat through the line due to poor IV access.  Followed by infectious disease.  Patient states she was initially treated with ceftriaxone but continued to have fevers.  She was seen by infectious disease and told that the organism was resistant to ceftriaxone.  Antibiotic was then changed to ertapenem and eventually meropenem but she continued to have fevers.  Last week she was advised by Dr. Drucilla Schmidt to come into the ED for admission as she needs her Port-A-Cath removed.  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  Patient states today she had a temperature of 102.3 F and again advised by ID to come into the ED.  States her hands have been swollen due to her vasculitis and taking Dilaudid helps.  States she has right-sided heart failure and has also had swelling of her feet.  She takes Benadryl for nausea as she is allergic to other  antiemetics.  Denies chest pain or shortness of breath.  No other complaints.  ED Course: Afebrile.  Tachycardic on arrival, resolved after fluid bolus.  Not hypotensive.  Labs showing no leukocytosis.  Lactic acid normal x2.  BUN 24, creatinine 1.3.  Baseline creatinine 0.9.  Blood culture x2 pending.  UA pending.  Chest x-ray showing no active cardiopulmonary disease.  Patient received Benadryl, Dilaudid, vancomycin, meropenem, and 1 L normal saline bolus.  ED provider discussed the case with Dr. Drucilla Schmidt who recommended giving meropenem only at this time.  ID will consult in a.m.  Review of Systems:  All systems reviewed and apart from history of presenting illness, are negative.  Past Medical History:  Diagnosis Date  . Addison's disease ([place])   . Adrenal insufficiency ([place])   . Anemia   . Anxiety   . Aortic stenosis   . Aortic stenosis   . Appendicitis 12/19/09  . Appendicitis   . Breast cancer ([place])    STATUS POST BILATERAL MASTECTOMY. STATUS POST RECONSTRUCTION. SHE HAD SILICONE BREAST IMPLANTS AND THE LEFT IMPLANT IS LEAKING SLIGHTLY  . Cellulitis of right middle finger 11/07/2018  . Cervical cancer ([place]) 12/23/2018  . Chest pain   . CHF with right heart failure ([place]) 04/17/2017  . Chronic respiratory failure with hypoxia ([place]) 12/23/2018  . Cough variant asthma 04/13/2019  . Depression   . GERD (gastroesophageal reflux disease)   . Headache    migraines  . Heart murmur   . History of kidney stones   . Hodgkin lymphoma (H[facility])    STATUS POST MANTLE RADIATION  . Hodgkin's lymphoma ([place])    1987  . Hypertension   .  Hypoxia   . Necrotizing fasciitis ([place]) 12/23/2018  . Non-ischemic cardiomyopathy ([place])   . Osteoporosis   . Palpitations   . Pituitary adenoma ([place]) 12/23/2018  . Pneumonia   . PONV (postoperative nausea and vomiting)   . Pre-diabetes    per pt; no meds  . Pulmonary hypertension ([place]) 12/23/2018  . Raynaud phenomenon   . Right heart failure ([place]) 04/17/2017  .  Supplemental oxygen dependent    3 liters  . SVT (supraventricular tachycardia) ([place])   . Tachycardia   . Thyroid cancer ([place])    STATUS POST SURGICAL REMOVAL-CURRENT ON THYROID REPLACEMENT    Past Surgical History:  Procedure Laterality Date  . ABDOMINAL HYSTERECTOMY    . AMPUTATION Left 01/30/2019   Procedure: Left Index finger amputation with flap reconstruction and repair reconstruction;  Surgeon: Roseanne Kaufman, MD;  Location: [place];  Service: Orthopedics;  Laterality: Left;  . APPENDECTOMY    . CARDIAC CATHETERIZATION  05/18/09   NORMAL CATH  . I & D EXTREMITY Left 12/23/2018   Procedure: IRRIGATION AND DEBRIDEMENT HAND / INDEX FINGER;  Surgeon: Roseanne Kaufman, MD;  Location: [place];  Service: Orthopedics;  Laterality: Left;  Marland Kitchen MASTECTOMY    . PITUITARY SURGERY    . RIGHT/LEFT HEART CATH AND CORONARY ANGIOGRAPHY N/A 04/02/2018   Procedure: RIGHT/LEFT HEART CATH AND CORONARY ANGIOGRAPHY;  Surgeon: Burnell Blanks, MD;  Location: [place] [facility];  Service: Cardiovascular;  Laterality: N/A;  . TOTAL THYROIDECTOMY    . VIDEO BRONCHOSCOPY Bilateral 11/14/2018   Procedure: VIDEO BRONCHOSCOPY WITHOUT FLUORO;  Surgeon: Margaretha Seeds, MD;  Location: [place];  Service: Cardiopulmonary;  Laterality: Bilateral;  . VIDEO BRONCHOSCOPY WITH ENDOBRONCHIAL ULTRASOUND N/A 11/19/2018   Procedure: VIDEO BRONCHOSCOPY WITH ENDOBRONCHIAL ULTRASOUND;  Surgeon: Margaretha Seeds, MD;  Location: [place];  Service: Thoracic;  Laterality: N/A;     reports that she has never smoked. She has never used smokeless tobacco. She reports current alcohol use. She reports that she does not use drugs.  Allergies  Allergen Reactions  . Mushroom Extract Complex Anaphylaxis  . Na Ferric Gluc Cplx In Sucrose Anaphylaxis  . Cymbalta [Duloxetine Hcl] Swelling and Anxiety  . Succinylcholine Other (See Comments)    Lock Jaw  . Buprenorphine Hcl Hives  . Compazine Other (See Comments)    Altered mental  status Aggression  . Metoclopramide Other (See Comments)    Dystonia  . Morphine And Related Hives  . Ondansetron Hives and Rash  . Promethazine Hcl Hives  . Tegaderm Ag Mesh [Silver] Rash    Old formulation only, is able to tolerate new formulation   Family history: No pertinent family history.  Prior to Admission medications   Medication Sig Start Date End Date Taking? Authorizing Provider  albuterol (2.5 MG/3ML) 0.083% NEBU 3 mL, albuterol (5 MG/ML) 0.5% NEBU 0.5 mL Inhale 3 mg into the lungs every 6 (six) hours as needed (SOB).    Yes [provider]  albuterol (VENTOLIN HFA) 108 (90 Base) MCG/ACT inhaler Inhale 2 puffs into the lungs every 6 (six) hours as needed for wheezing or shortness of breath.   Yes [provider]  Alum & Mag Hydroxide-Simeth (GI COCKTAIL) SUSP suspension Take 30 mLs daily as needed by mouth for indigestion (BLEEDING ULCERS). Shake well.   Yes [provider]  aspirin 325 MG tablet Take 325 mg by mouth at bedtime.    Yes [provider]  beclomethasone (QVAR) 80 MCG/ACT inhaler Inhale 1 puff  into the lungs 2 (two) times daily.    Yes [provider]  benzonatate (TESSALON) 200 MG capsule Take 1 capsule (200 mg total) by mouth 3 (three) times daily as needed for cough. 04/15/19  Yes Martyn Ehrich, NP  clonazePAM (KLONOPIN) 1 MG tablet Take 1 mg by mouth 2 (two) times daily.    Yes [provider]  colchicine 0.6 MG tablet Take 0.6 mg by mouth daily in the afternoon.  12/31/19  Yes [provider]  dexlansoprazole (DEXILANT) 60 MG capsule Take 60 mg by mouth daily.     Yes [provider]  diphenhydrAMINE (BENADRYL) 50 MG/ML injection Inject 1 mL (50 mg total) into the vein every 6 (six) hours as needed (nausea). 12/25/18  Yes Rai, Ripudeep K, MD  dronabinol (MARINOL) 2.5 MG capsule Take 1 capsule (2.5 mg total) by mouth 2 (two) times daily as needed (nausea). 04/10/20  Yes Gherghe, Costin M, MD   EPINEPHRINE 0.3 mg/0.3 mL IJ SOAJ injection INJECT 0.3 MLS (0.3 MG TOTAL) INTO THE MUSCLE AS NEEDED FOR ANAPHYLAXIS. Patient taking differently: Inject 0.3 mg into the muscle daily as needed for anaphylaxis.  12/09/19  Yes Margaretha Seeds, MD  escitalopram (LEXAPRO) 20 MG tablet Take 20 mg by mouth every evening.  04/24/16  Yes [provider]  fluticasone furoate-vilanterol (BREO ELLIPTA) 200-25 MCG/INH AEPB Inhale 1 puff into the lungs daily.   Yes [provider]  furosemide (LASIX) 40 MG tablet Take 1 tablet (40 mg total) by mouth in the morning and at bedtime. 04/10/20  Yes Gherghe, Vella Redhead, MD  Galcanezumab-gnlm 120 MG/ML SOAJ Inject 120 mg into the skin every 28 (twenty-eight) days.  03/31/19  Yes [provider]  Heparin Lock Flush (HEPARIN NICU/SCN FLUSH) 1 UNIT/ML SOLN Heparin Lock Flush Solution - 500 USP UNITS/5 mL  #30 pack with 1 refill 02/01/20  Yes Nicholas Lose, MD  hydrocortisone (CORTEF) 10 MG tablet Take 1-2 tablets (10-20 mg total) by mouth See admin instructions. Take 20 mg in the am and 45m in the evening Patient taking differently: Take 10-20 mg by mouth See admin instructions. Take 20 mg by mouth in the morning and 141min the evening 02/17/20  Yes Kyle, Tyrone A, DO  levothyroxine (SYNTHROID, LEVOTHROID) 112 MCG tablet Take 112 mcg by mouth daily before breakfast.   Yes [provider]  Mepolizumab (NUCALA) 100 MG SOLR Inject 100 mg into the skin every 28 (twenty-eight) days. 05/13/19  Yes ElMargaretha SeedsMD  metoprolol succinate (TOPROL-XL) 25 MG 24 hr tablet Take 37.5 mg by mouth daily.    Yes [provider]  OXYGEN Inhale 4 L into the lungs continuous.    Yes [provider]  Rimegepant Sulfate (NURTEC) 75 MG TBDP Take 75 mg by mouth daily as needed (Migraine).    Yes [provider]  rosuvastatin (CRESTOR) 10 MG tablet Take 10 mg by mouth daily.  02/28/18  Yes [provider]  silver sulfADIAZINE  (SILVADENE) 1 % cream Apply 1 application topically daily as needed (Open Wound).  11/16/19  Yes [provider]  sodium chloride 0.9 % infusion Use as directed. 04/19/20  Yes GuNicholas LoseMD  sodium chloride flush 0.9 % SOLN injection 10 mL syringes - #30 count with 1 refill 04/19/20  Yes Tanner, VaLyndon Code PA-C  sucralfate (CARAFATE) 1 g tablet Take 1 g by mouth daily as needed (FOR ULCERS).    Yes [provider]  topiramate (TOPAMAX)  50 MG tablet Take 150 mg by mouth at bedtime.  12/25/19  Yes [provider]  zolpidem (AMBIEN CR) 12.5 MG CR tablet Take 12.5 mg by mouth at bedtime.     Yes [provider]  LORazepam (ATIVAN) 0.5 MG tablet TAKE 1-2 TABLETS (0.5-1 MG TOTAL) BY MOUTH 2 (TWO) TIMES DAILY AS NEEDED (NAUSEA). Patient not taking: Reported on 04/26/2020 04/22/20   Nicholas Lose, MD    Physical Exam: Vitals:   04/26/20 1901 04/26/20 2138 04/26/20 2230 04/27/20 0038  BP: (!) 125/94 (!) 144/86 (!) 132/94 123/84  Pulse: 93 97 90 98  Resp: _0 Temp:    98.2 F (36.8 C)  TempSrc:    Oral  SpO2: 100% 100% 100% 100%  Weight:      Height:        Physical Exam Constitutional:      General: She is not in acute distress. HENT:     Head: Normocephalic and atraumatic.     Mouth/Throat:     Mouth: Mucous membranes are moist.     Pharynx: Oropharynx is clear.  Eyes:     Extraocular Movements: Extraocular movements intact.     Conjunctiva/sclera: Conjunctivae normal.  Cardiovascular:     Rate and Rhythm: Normal rate and regular rhythm.     Pulses: Normal pulses.     Heart sounds: Murmur heard.   Pulmonary:     Effort: Pulmonary effort is normal. No respiratory distress.     Breath sounds: Normal breath sounds. No wheezing or rales.  Abdominal:     General: Bowel sounds are normal. There is no distension.     Palpations: Abdomen is soft.     Tenderness: There is no abdominal tenderness. There is no guarding.  Musculoskeletal:         General: No tenderness.     Cervical back: Normal range of motion and neck supple.     Right lower leg: Edema present.     Left lower leg: Edema present.     Comments: Nonpitting edema of bilateral lower extremities  Skin:    General: Skin is warm and dry.  Neurological:     General: No focal deficit present.     Mental Status: She is alert and oriented to person, place, and time.     Labs on Admission: I have personally reviewed following labs and imaging studies  CBC: Recent Labs  Lab 04/26/20 1659  WBC 8.7  NEUTROABS 5.2  HGB 12.2  HCT 38.9  MCV 92.4  PLT 388   Basic Metabolic Panel: Recent Labs  Lab 04/26/20 1659  NA 142  K 3.7  CL 101  CO2 30  GLUCOSE 111*  BUN 24*  CREATININE 1.31*  CALCIUM 10.0   GFR: Estimated Creatinine Clearance: 57.2 mL/min (A) (by C-G formula based on SCr of 1.31 mg/dL (H)). Liver Function Tests: Recent Labs  Lab 04/26/20 1659  AST 29  ALT 39  ALKPHOS 64  BILITOT 0.5  PROT 7.9  ALBUMIN 4.2   No results for input(s): LIPASE, AMYLASE in the last 168 hours. No results for input(s): AMMONIA in the last 168 hours. Coagulation Profile: Recent Labs  Lab 04/26/20 1659  INR 1.0   Cardiac Enzymes: No results for input(s): CKTOTAL, CKMB, CKMBINDEX, TROPONINI in the last 168 hours. BNP (last 3 results) No results for input(s): PROBNP in the last 8760 hours. HbA1C: No results for input(s): HGBA1C in the last 72 hours. CBG: No results for  input(s): GLUCAP in the last 168 hours. Lipid Profile: No results for input(s): CHOL, HDL, LDLCALC, TRIG, CHOLHDL, LDLDIRECT in the last 72 hours. Thyroid Function Tests: No results for input(s): TSH, T4TOTAL, FREET4, T3FREE, THYROIDAB in the last 72 hours. Anemia Panel: No results for input(s): VITAMINB12, FOLATE, FERRITIN, TIBC, IRON, RETICCTPCT in the last 72 hours. Urine analysis:    Component Value Date/Time   COLORURINE YELLOW 04/01/2020 1945   APPEARANCEUR CLEAR 04/01/2020 1945    LABSPEC 1.009 04/01/2020 1945   PHURINE 6.0 04/01/2020 1945   GLUCOSEU NEGATIVE 04/01/2020 1945   HGBUR NEGATIVE 04/01/2020 1945   BILIRUBINUR NEGATIVE 04/01/2020 1945   KETONESUR NEGATIVE 04/01/2020 1945   PROTEINUR NEGATIVE 04/01/2020 1945   UROBILINOGEN 1.0 06/16/2015 1045   NITRITE NEGATIVE 04/01/2020 1945   LEUKOCYTESUR NEGATIVE 04/01/2020 1945    Radiological Exams on Admission: DG Chest 2 View  Result Date: 04/26/2020 CLINICAL DATA:  49 year old female with shortness of breath. Concern for sepsis. EXAM: CHEST - 2 VIEW COMPARISON:  Chest radiograph dated 04/09/2020. FINDINGS: Right-sided Port-A-Cath in similar position. There is no focal consolidation, pleural effusion, or pneumothorax. The cardiac silhouette is within limits. Atherosclerotic calcification of the aorta. No acute osseous pathology. IMPRESSION: No active cardiopulmonary disease. Electronically Signed   By: Anner Crete M.D.   On: 04/26/2020 19:06    Assessment/Plan Principal Problem:   Bacteremia Active Problems:   Adrenal insufficiency (HCC)   Hyperlipidemia   Chronic respiratory failure with hypoxia (HCC)   AKI (acute kidney injury) ([place])   Sphingomonas paucimobilis bacteremia: Recent history of sphingomonas paucimobilis bacteremia associated with line with attempts to treat through the line due to poor IV access.  Followed by infectious disease.  She was initially treated with ceftriaxone to which the organism was resistant, antibiotic then changed to ertapenem, and eventually to meropenem.  Patient reports temperature of 102.3 F at home.  Afebrile in the ED.  Tachycardia resolved after fluid bolus.  Labs showing no leukocytosis.  Lactic acid normal x2. -Needs Port-A-Cath removed, however, has poor peripheral IV access due to history of vasculopathy versus vasculitis.  Will need additional IV access/ central line before Port-A-Cath can be removed. Dr. Drucilla Schmidt recommending treating with meropenem only at this  time and will consult in a.m.  Repeat blood cultures drawn.  AKI: BUN 24, creatinine 1.3.  Baseline creatinine 0.9. -Patient received 1 L fluid bolus in the ED.  Avoid nephrotoxic agents and repeat BMP in a.m.  Adrenal insufficiency -Continue hydrocortisone  Hypertension: Currently normotensive. -Continue metoprolol  Hyperlipidemia -Continue Crestor  Anxiety, depression -Continue Lexapro, Klonopin, Topamax  Insomnia -Continue Ambien as needed at bedtime  Hypothyroidism -Continue Synthroid  Chronic hypoxic respiratory failure: On 3 to 4 L supplemental oxygen at home. -Continue home oxygen  DVT prophylaxis: Subcutaneous heparin Code Status: Patient wishes to be DNR. Family Communication: No family available at this time. Disposition Plan: Status is: Inpatient  Remains inpatient appropriate because:IV treatments appropriate due to intensity of illness or inability to take PO   Dispo: The patient is from: Home              Anticipated d/c is to: Home              Anticipated d/c date is: > 3 days              Patient currently is not medically stable to d/c.  The medical decision making on this patient was of high complexity and the patient is at high risk  for clinical deterioration, therefore this is a level 3 visit.  Shela Leff MD [facility]  If 7PM-7AM, please contact night-coverage [email or web address]  04/27/2020, 12:55 AM

## 2020-04-26 NOTE — Telephone Encounter (Signed)
Called patient to schedule appointment, she was on phone with infectious disease doctor who was admitting her to the hospital. I told patient I would let Dr. Loanne Drilling know and to follow up after she was discharged from hospital.

## 2020-04-26 NOTE — ED Provider Notes (Signed)
[facility] DEPT Provider Note   CSN: [id number] Arrival date & time: 04/26/20  1604     History Chief Complaint  Patient presents with  . Blood Infection    [name] is a 49 y.o. female.  HPI       49 year old female with prior hx of hodgkin's lymphoma, breast cancer, CHF, lung injury on 3L O2, Addison's disease, recent diagnosis of sphigomonas paucimobilis bacteremia started on ceftriaxone initially, with sensitivities now found to be resistant, antibiotics changed to ertapenem with continued fevers and started on meropenem who presents at request of ID physicians for continued fevers, concern for infected port.   Reports fevers up to 102. Fevers began again on Friday after improving and has had them daily.  Developed ulcers to throat, reports having similar symptoms with initial diagnosis of bacteremia. Has a spot develop on her hand, reports from vasculitis and that during last admission she had several that improved with abx treatment.   Past Medical History:  Diagnosis Date  . Addison's disease ([place])   . Adrenal insufficiency ([place])   . Anemia   . Anxiety   . Aortic stenosis   . Aortic stenosis   . Appendicitis 12/19/09  . Appendicitis   . Breast cancer ([place])    STATUS POST BILATERAL MASTECTOMY. STATUS POST RECONSTRUCTION. SHE HAD SILICONE BREAST IMPLANTS AND THE LEFT IMPLANT IS LEAKING SLIGHTLY  . Cellulitis of right middle finger 11/07/2018  . Cervical cancer ([place]) 12/23/2018  . Chest pain   . CHF with right heart failure ([place]) 04/17/2017  . Chronic respiratory failure with hypoxia ([place]) 12/23/2018  . Cough variant asthma 04/13/2019  . Depression   . GERD (gastroesophageal reflux disease)   . Headache    migraines  . Heart murmur   . History of kidney stones   . Hodgkin lymphoma (HCC)    STATUS POST MANTLE RADIATION  . Hodgkin's lymphoma ([place])    1987  . Hypertension   . Hypoxia   . Necrotizing fasciitis ([place]) 12/23/2018  .  Non-ischemic cardiomyopathy ([place])   . Osteoporosis   . Palpitations   . Pituitary adenoma ([place]) 12/23/2018  . Pneumonia   . PONV (postoperative nausea and vomiting)   . Pre-diabetes    per pt; no meds  . Pulmonary hypertension ([place]) 12/23/2018  . Raynaud phenomenon   . Right heart failure ([place]) 04/17/2017  . Supplemental oxygen dependent    3 liters  . SVT (supraventricular tachycardia) ([place])   . Tachycardia   . Thyroid cancer ([place])    STATUS POST SURGICAL REMOVAL-CURRENT ON THYROID REPLACEMENT    Patient Active Problem List   Diagnosis Date Noted  . AKI (acute kidney injury) ([place]) 04/27/2020  . Bacteremia 04/19/2020  . Addison's disease ([place])   . Nausea 04/01/2020  . Generalized weakness 02/10/2020  . Headache 02/10/2020  . Subcutaneous nodule 02/01/2020  . Port-A-Cath in place 09/09/2019  . Moderate persistent asthma without complication 40/07/2724  . Acute superficial venous thrombosis of lower extremity, right 07/16/2019  . Injury of left index finger 12/24/2018  . Pituitary adenoma ([place]) 12/23/2018  . History of cervical cancer 12/23/2018  . Chronic respiratory failure with hypoxia ([place]) 12/23/2018  . SOB (shortness of breath)   . Nephrolithiasis 11/07/2018  . Oxygen dependent 11/07/2018  . Migraine 11/07/2018  . PVC's (premature ventricular contractions) 10/27/2018  . Hyperlipidemia 03/09/2014  . Hx of Hodgkins lymphoma   . History of ductal carcinoma in situ (DCIS) of breast   .  Thyroid cancer ([place])   . Adrenal insufficiency ([place])   . Hypertension   . Raynaud phenomenon   . GERD (gastroesophageal reflux disease)     Past Surgical History:  Procedure Laterality Date  . ABDOMINAL HYSTERECTOMY    . AMPUTATION Left 01/30/2019   Procedure: Left Index finger amputation with flap reconstruction and repair reconstruction;  Surgeon: Roseanne Kaufman, MD;  Location: [facility];  Service: Orthopedics;  Laterality: Left;  . APPENDECTOMY    . CARDIAC CATHETERIZATION  05/18/09    NORMAL CATH  . I & D EXTREMITY Left 12/23/2018   Procedure: IRRIGATION AND DEBRIDEMENT HAND / INDEX FINGER;  Surgeon: Roseanne Kaufman, MD;  Location: [facility];  Service: Orthopedics;  Laterality: Left;  [facility] Kitchen MASTECTOMY    . PITUITARY SURGERY    . RIGHT/LEFT HEART CATH AND CORONARY ANGIOGRAPHY N/A 04/02/2018   Procedure: RIGHT/LEFT HEART CATH AND CORONARY ANGIOGRAPHY;  Surgeon: Burnell Blanks, MD;  Location: [facility];  Service: Cardiovascular;  Laterality: N/A;  . TOTAL THYROIDECTOMY    . VIDEO BRONCHOSCOPY Bilateral 11/14/2018   Procedure: VIDEO BRONCHOSCOPY WITHOUT FLUORO;  Surgeon: Margaretha Seeds, MD;  Location: [facility];  Service: Cardiopulmonary;  Laterality: Bilateral;  . VIDEO BRONCHOSCOPY WITH ENDOBRONCHIAL ULTRASOUND N/A 11/19/2018   Procedure: VIDEO BRONCHOSCOPY WITH ENDOBRONCHIAL ULTRASOUND;  Surgeon: Margaretha Seeds, MD;  Location: [facility];  Service: Thoracic;  Laterality: N/A;     OB History   No obstetric history on file.     Family History  Family history unknown: Yes    Social History   Tobacco Use  . Smoking status: Never Smoker  . Smokeless tobacco: Never Used  Vaping Use  . Vaping Use: Never used  Substance Use Topics  . Alcohol use: Yes    Comment: social   . Drug use: No    Home Medications Prior to Admission medications   Medication Sig Start Date End Date Taking? Authorizing Provider  albuterol (2.5 MG/3ML) 0.083% NEBU 3 mL, albuterol (5 MG/ML) 0.5% NEBU 0.5 mL Inhale 3 mg into the lungs every 6 (six) hours as needed (SOB).    Yes [provider]  albuterol (VENTOLIN HFA) 108 (90 Base) MCG/ACT inhaler Inhale 2 puffs into the lungs every 6 (six) hours as needed for wheezing or shortness of breath.   Yes [provider]  Alum & Mag Hydroxide-Simeth (GI COCKTAIL) SUSP suspension Take 30 mLs daily as needed by mouth for indigestion (BLEEDING ULCERS). Shake well.   Yes [provider]  aspirin 325 MG tablet Take 325  mg by mouth at bedtime.    Yes [provider]  beclomethasone (QVAR) 80 MCG/ACT inhaler Inhale 1 puff into the lungs 2 (two) times daily.    Yes [provider]  benzonatate (TESSALON) 200 MG capsule Take 1 capsule (200 mg total) by mouth 3 (three) times daily as needed for cough. 04/15/19  Yes Martyn Ehrich, NP  clonazePAM (KLONOPIN) 1 MG tablet Take 1 mg by mouth 2 (two) times daily.    Yes [provider]  colchicine 0.6 MG tablet Take 0.6 mg by mouth daily in the afternoon.  12/31/19  Yes [provider]  dexlansoprazole (DEXILANT) 60 MG capsule Take 60 mg by mouth daily.     Yes [provider]  diphenhydrAMINE (BENADRYL) 50 MG/ML injection Inject 1 mL (50 mg total) into the vein every 6 (six) hours as needed (nausea). 12/25/18  Yes Rai, Ripudeep K, MD  dronabinol (MARINOL) 2.5 MG  capsule Take 1 capsule (2.5 mg total) by mouth 2 (two) times daily as needed (nausea). 04/10/20  Yes Gherghe, Costin M, MD  EPINEPHRINE 0.3 mg/0.3 mL IJ SOAJ injection INJECT 0.3 MLS (0.3 MG TOTAL) INTO THE MUSCLE AS NEEDED FOR ANAPHYLAXIS. Patient taking differently: Inject 0.3 mg into the muscle daily as needed for anaphylaxis.  12/09/19  Yes Margaretha Seeds, MD  escitalopram (LEXAPRO) 20 MG tablet Take 20 mg by mouth every evening.  04/24/16  Yes [provider]  fluticasone furoate-vilanterol (BREO ELLIPTA) 200-25 MCG/INH AEPB Inhale 1 puff into the lungs daily.   Yes [provider]  furosemide (LASIX) 40 MG tablet Take 1 tablet (40 mg total) by mouth in the morning and at bedtime. 04/10/20  Yes Gherghe, Vella Redhead, MD  Galcanezumab-gnlm 120 MG/ML SOAJ Inject 120 mg into the skin every 28 (twenty-eight) days.  03/31/19  Yes [provider]  Heparin Lock Flush (HEPARIN NICU/SCN FLUSH) 1 UNIT/ML SOLN Heparin Lock Flush Solution - 500 USP UNITS/5 mL  #30 pack with 1 refill 02/01/20  Yes Nicholas Lose, MD  hydrocortisone (CORTEF) 10 MG tablet Take 1-2  tablets (10-20 mg total) by mouth See admin instructions. Take 20 mg in the am and 70m in the evening Patient taking differently: Take 10-20 mg by mouth See admin instructions. Take 20 mg by mouth in the morning and 129min the evening 02/17/20  Yes Kyle, Tyrone A, DO  levothyroxine (SYNTHROID, LEVOTHROID) 112 MCG tablet Take 112 mcg by mouth daily before breakfast.   Yes [provider]  Mepolizumab (NUCALA) 100 MG SOLR Inject 100 mg into the skin every 28 (twenty-eight) days. 05/13/19  Yes ElMargaretha SeedsMD  metoprolol succinate (TOPROL-XL) 25 MG 24 hr tablet Take 37.5 mg by mouth daily.    Yes [provider]  OXYGEN Inhale 4 L into the lungs continuous.    Yes [provider]  Rimegepant Sulfate (NURTEC) 75 MG TBDP Take 75 mg by mouth daily as needed (Migraine).    Yes [provider]  rosuvastatin (CRESTOR) 10 MG tablet Take 10 mg by mouth daily.  02/28/18  Yes [provider]  silver sulfADIAZINE (SILVADENE) 1 % cream Apply 1 application topically daily as needed (Open Wound).  11/16/19  Yes [provider]  sodium chloride 0.9 % infusion Use as directed. 04/19/20  Yes GuNicholas LoseMD  sodium chloride flush 0.9 % SOLN injection 10 mL syringes - #30 count with 1 refill 04/19/20  Yes Tanner, VaLyndon Code PA-C  sucralfate (CARAFATE) 1 g tablet Take 1 g by mouth daily as needed (FOR ULCERS).    Yes [provider]  topiramate (TOPAMAX) 50 MG tablet Take 150 mg by mouth at bedtime.  12/25/19  Yes [provider]  zolpidem (AMBIEN CR) 12.5 MG CR tablet Take 12.5 mg by mouth at bedtime.     Yes [provider]  LORazepam (ATIVAN) 0.5 MG tablet TAKE 1-2 TABLETS (0.5-1 MG TOTAL) BY MOUTH 2 (TWO) TIMES DAILY AS NEEDED (NAUSEA). Patient not taking: Reported on 04/26/2020 04/22/20   GuNicholas LoseMD    Allergies    Mushroom extract complex, Na ferric gluc cplx in sucrose, Cymbalta [duloxetine hcl], Succinylcholine, Buprenorphine  hcl, Compazine, Metoclopramide, Morphine and related, Ondansetron, Promethazine hcl, and Tegaderm ag mesh [silver]  Review of Systems   Review of Systems  Constitutional: Positive for appetite change, fatigue and fever.  HENT: Positive for mouth sores and sore throat.   Respiratory: Negative for  cough and shortness of breath.   Cardiovascular: Negative for chest pain.  Gastrointestinal: Positive for nausea. Negative for abdominal pain and vomiting.  Genitourinary: Negative for dysuria. Vaginal discharge: vaginal itching.  Musculoskeletal: Positive for arthralgias.  Skin: Positive for rash (spot on finger).  Neurological: Negative for headaches.    Physical Exam Updated Vital Signs BP 97/66 (BP Location: Left Arm)   Pulse 89   Temp 97.8 F (36.6 C) (Oral)   Resp 17   Ht _0  (1.651 m)   Wt 88.9 kg   LMP  (LMP Unknown)   SpO2 99%   BMI 32.62 kg/m   Physical Exam Vitals and nursing note reviewed.  Constitutional:      General: She is not in acute distress.    Appearance: She is well-developed. She is not diaphoretic.  HENT:     Head: Normocephalic and atraumatic.  Eyes:     Conjunctiva/sclera: Conjunctivae normal.  Cardiovascular:     Rate and Rhythm: Normal rate and regular rhythm.     Heart sounds: Normal heart sounds. No murmur heard.  No friction rub. No gallop.   Pulmonary:     Effort: Pulmonary effort is normal. No respiratory distress.     Breath sounds: Normal breath sounds. No wheezing or rales.  Abdominal:     General: There is no distension.     Palpations: Abdomen is soft.     Tenderness: There is no abdominal tenderness. There is no guarding.  Musculoskeletal:        General: No tenderness.     Cervical back: Normal range of motion.  Skin:    General: Skin is warm and dry.     Findings: No erythema or rash.     Comments: Papule right hand   Neurological:     Mental Status: She is alert and oriented to person, place, and time.     ED Results /  Procedures / Treatments   Labs (all labs ordered are listed, but only abnormal results are displayed) Labs Reviewed  COMPREHENSIVE METABOLIC PANEL - Abnormal; Notable for the following components:      Result Value   Glucose, Bld 111 (*)    BUN 24 (*)    Creatinine, Ser 1.31 (*)    GFR calc non Af Amer 48 (*)    GFR calc Af Amer 55 (*)    All other components within normal limits  BASIC METABOLIC PANEL - Abnormal; Notable for the following components:   Glucose, Bld 112 (*)    BUN 24 (*)    Creatinine, Ser 1.13 (*)    GFR calc non Af Amer 57 (*)    All other components within normal limits  SARS CORONAVIRUS 2 BY RT PCR (HOSPITAL ORDER, [facility])  CULTURE, BLOOD (ROUTINE X 2)  CULTURE, BLOOD (ROUTINE X 2)  URINE CULTURE  LACTIC ACID, PLASMA  LACTIC ACID, PLASMA  CBC WITH DIFFERENTIAL/PLATELET  PROTIME-INR  URINALYSIS, ROUTINE W REFLEX MICROSCOPIC  I-STAT BETA HCG BLOOD, ED ([facility], [facility], [facility] ONLY)    EKG None  Radiology DG Chest 2 View  Result Date: 04/26/2020 CLINICAL DATA:  49 year old female with shortness of breath. Concern for sepsis. EXAM: CHEST - 2 VIEW COMPARISON:  Chest radiograph dated 04/09/2020. FINDINGS: Right-sided Port-A-Cath in similar position. There is no focal consolidation, pleural effusion, or pneumothorax. The cardiac silhouette is within limits. Atherosclerotic calcification of the aorta. No acute osseous pathology. IMPRESSION: No active cardiopulmonary disease. Electronically Signed  By: Anner Crete M.D.   On: 04/26/2020 19:06    Procedures .Critical Care Performed by: Gareth Morgan, MD Authorized by: Gareth Morgan, MD   Critical care provider statement:    Critical care time (minutes):  45   Critical care was time spent personally by me on the following activities:  Discussions with consultants, evaluation of patient's response to treatment, examination of patient, ordering and performing treatments and  interventions, ordering and review of laboratory studies, ordering and review of radiographic studies, pulse oximetry, re-evaluation of patient's condition, obtaining history from patient or surrogate and review of old charts   (including critical care time)  Medications Ordered in ED Medications  diphenhydrAMINE (BENADRYL) injection 25 mg (25 mg Intravenous Given 04/27/20 0837)  HYDROmorphone (DILAUDID) injection 0.5 mg (0.5 mg Intravenous Given 04/27/20 1010)  metoprolol succinate (TOPROL-XL) 24 hr tablet 37.5 mg (37.5 mg Oral Not Given 04/27/20 1002)  rosuvastatin (CRESTOR) tablet 10 mg (10 mg Oral Given 04/27/20 1003)  escitalopram (LEXAPRO) tablet 20 mg (has no administration in time range)  zolpidem (AMBIEN) tablet 5 mg (5 mg Oral Given 04/27/20 0119)  hydrocortisone (CORTEF) tablet 20 mg (20 mg Oral Given 04/27/20 1003)  levothyroxine (SYNTHROID) tablet 112 mcg (112 mcg Oral Given 04/27/20 0536)  clonazePAM (KLONOPIN) tablet 1 mg (1 mg Oral Not Given 04/27/20 1003)  topiramate (TOPAMAX) tablet 150 mg (150 mg Oral Given 04/27/20 0119)  albuterol (PROVENTIL) (2.5 MG/3ML) 0.083% nebulizer solution 2.5 mg (has no administration in time range)  fluticasone furoate-vilanterol (BREO ELLIPTA) 200-25 MCG/INH 1 puff (1 puff Inhalation Not Given 04/27/20 0826)  acetaminophen (TYLENOL) tablet 650 mg (has no administration in time range)    Or  acetaminophen (TYLENOL) suppository 650 mg (has no administration in time range)  hydrocortisone (CORTEF) tablet 10 mg (has no administration in time range)  heparin injection 5,000 Units (5,000 Units Subcutaneous Not Given 04/27/20 0537)  dexlansoprazole (DEXILANT) capsule 60 mg (60 mg Oral Given 04/27/20 0836)  meropenem (MERREM) 2 g in sodium chloride 0.9 % 100 mL IVPB (has no administration in time range)  Chlorhexidine Gluconate Cloth 2 % PADS 6 each (6 each Topical Given 04/27/20 1004)  diphenhydrAMINE (BENADRYL) injection 50 mg (50 mg Intravenous Given During  Downtime 04/26/20 1800)  HYDROmorphone (DILAUDID) injection 0.5 mg (0.5 mg Intravenous Given During Downtime 04/26/20 1800)  sodium chloride 0.9 % bolus 1,000 mL (0 mLs Intravenous Stopped 04/26/20 2355)  vancomycin (VANCOREADY) IVPB 1750 mg/350 mL (0 mg Intravenous Stopped 04/26/20 2355)  meropenem (MERREM) 1 g in sodium chloride 0.9 % 100 mL IVPB (0 g Intravenous Stopped 04/26/20 2205)  diphenhydrAMINE (BENADRYL) injection 25 mg (25 mg Intravenous Given 04/26/20 2131)  HYDROmorphone (DILAUDID) injection 0.5 mg (0.5 mg Intravenous Given 04/26/20 2131)  ibuprofen (ADVIL) tablet 400 mg (400 mg Oral Given 04/27/20 0856)    ED Course  I have reviewed the triage vital signs and the nursing notes.  Pertinent labs & imaging results that were available during my care of the patient were reviewed by me and considered in my medical decision making (see chart for details).    MDM Rules/Calculators/A&P                          49 year old female with prior hx of hodgkin's lymphoma, breast cancer, CHF, lung injury on 3L O2, Addison's disease, recent diagnosis of sphigomonas paucimobilis bacteremia started on ceftriaxone initially, with sensitivities now found to be resistant, antibiotics changed to ertapenem with continued  fevers and started on meropenem who presents at request of ID physicians for continued fevers, concern for infected port.   Hemodynamically stable, normal lactic acid, no signs of sepsis.  Ordered meropenem and broadened coverage to include vancomycin. Consider endocarditis given skin findings and persistent fever, nowever skin findings may also be secondary to vasculitis per pt and ID physician hx.  Will admit for continued care, ID consult in AM, likely port removal.     Final Clinical Impression(s) / ED Diagnoses Final diagnoses:  Bacteremia    Rx / DC Orders ED Discharge Orders    None       Gareth Morgan, MD 04/27/20 1038

## 2020-04-27 ENCOUNTER — Encounter (HOSPITAL_COMMUNITY): Payer: Self-pay | Admitting: Internal Medicine

## 2020-04-27 ENCOUNTER — Inpatient Hospital Stay (HOSPITAL_COMMUNITY): Payer: BC Managed Care – PPO

## 2020-04-27 DIAGNOSIS — J4540 Moderate persistent asthma, uncomplicated: Secondary | ICD-10-CM

## 2020-04-27 DIAGNOSIS — Z86000 Personal history of in-situ neoplasm of breast: Secondary | ICD-10-CM

## 2020-04-27 DIAGNOSIS — Z8541 Personal history of malignant neoplasm of cervix uteri: Secondary | ICD-10-CM

## 2020-04-27 DIAGNOSIS — R509 Fever, unspecified: Secondary | ICD-10-CM

## 2020-04-27 DIAGNOSIS — E271 Primary adrenocortical insufficiency: Secondary | ICD-10-CM

## 2020-04-27 DIAGNOSIS — Z95828 Presence of other vascular implants and grafts: Secondary | ICD-10-CM

## 2020-04-27 DIAGNOSIS — I10 Essential (primary) hypertension: Secondary | ICD-10-CM

## 2020-04-27 DIAGNOSIS — R011 Cardiac murmur, unspecified: Secondary | ICD-10-CM

## 2020-04-27 DIAGNOSIS — E2740 Unspecified adrenocortical insufficiency: Secondary | ICD-10-CM

## 2020-04-27 DIAGNOSIS — J9611 Chronic respiratory failure with hypoxia: Secondary | ICD-10-CM

## 2020-04-27 DIAGNOSIS — Z86018 Personal history of other benign neoplasm: Secondary | ICD-10-CM

## 2020-04-27 DIAGNOSIS — E7800 Pure hypercholesterolemia, unspecified: Secondary | ICD-10-CM

## 2020-04-27 DIAGNOSIS — Z85850 Personal history of malignant neoplasm of thyroid: Secondary | ICD-10-CM

## 2020-04-27 DIAGNOSIS — I999 Unspecified disorder of circulatory system: Secondary | ICD-10-CM

## 2020-04-27 DIAGNOSIS — N179 Acute kidney failure, unspecified: Secondary | ICD-10-CM | POA: Diagnosis present

## 2020-04-27 DIAGNOSIS — Z8571 Personal history of Hodgkin lymphoma: Secondary | ICD-10-CM

## 2020-04-27 HISTORY — PX: IR REMOVAL TUN ACCESS W/ PORT W/O FL MOD SED: IMG2290

## 2020-04-27 LAB — BASIC METABOLIC PANEL
Anion gap: 12 (ref 5–15)
BUN: 24 mg/dL — ABNORMAL HIGH (ref 6–20)
CO2: 27 mmol/L (ref 22–32)
Calcium: 9.4 mg/dL (ref 8.9–10.3)
Chloride: 101 mmol/L (ref 98–111)
Creatinine, Ser: 1.13 mg/dL — ABNORMAL HIGH (ref 0.44–1.00)
GFR calc Af Amer: >60 mL/min (ref >60)
GFR calc non Af Amer: 57 mL/min — ABNORMAL LOW (ref >60)
Glucose, Bld: 112 mg/dL — ABNORMAL HIGH (ref 70–99)
Potassium: 3.8 mmol/L (ref 3.5–5.1)
Sodium: 140 mmol/L (ref 135–145)

## 2020-04-27 LAB — SARS CORONAVIRUS 2 BY RT PCR (HOSPITAL ORDER, PERFORMED IN ~~LOC~~ HOSPITAL LAB): SARS Coronavirus 2: NEGATIVE

## 2020-04-27 MED ORDER — ZOLPIDEM TARTRATE 5 MG PO TABS
5.0000 mg | ORAL_TABLET | Freq: Every evening | ORAL | Status: DC | PRN
  Administered 2020-04-27 – 2020-04-28 (×3): 5 mg via ORAL
  Filled 2020-04-27 (×3): qty 1

## 2020-04-27 MED ORDER — HEPARIN SODIUM (PORCINE) 5000 UNIT/ML IJ SOLN: 5000.0000 [IU] | Freq: Three times a day (TID) | INTRAMUSCULAR | Status: DC

## 2020-04-27 MED ORDER — MIDAZOLAM HCL 2 MG/2ML IJ SOLN
INTRAMUSCULAR | Status: AC
  Filled 2020-04-27: qty 4

## 2020-04-27 MED ORDER — HYDROCORTISONE 10 MG PO TABS
20.0000 mg | ORAL_TABLET | Freq: Every day | ORAL | Status: DC
  Administered 2020-04-27 – 2020-04-29 (×3): 20 mg via ORAL
  Filled 2020-04-27 (×3): qty 2

## 2020-04-27 MED ORDER — MIDAZOLAM HCL 2 MG/2ML IJ SOLN
INTRAMUSCULAR | Status: AC | PRN
  Administered 2020-04-27 (×4): 1 mg via INTRAVENOUS

## 2020-04-27 MED ORDER — DIPHENHYDRAMINE HCL 50 MG/ML IJ SOLN
50.0000 mg | Freq: Four times a day (QID) | INTRAMUSCULAR | Status: DC | PRN
  Administered 2020-04-27 – 2020-04-29 (×7): 50 mg via INTRAVENOUS
  Filled 2020-04-27 (×8): qty 1

## 2020-04-27 MED ORDER — FENTANYL CITRATE (PF) 100 MCG/2ML IJ SOLN
INTRAMUSCULAR | Status: AC
  Filled 2020-04-27: qty 2

## 2020-04-27 MED ORDER — PANTOPRAZOLE SODIUM 40 MG PO TBEC: 40.0000 mg | DELAYED_RELEASE_TABLET | Freq: Every day | ORAL | Status: DC

## 2020-04-27 MED ORDER — CLONAZEPAM 1 MG PO TABS
1.0000 mg | ORAL_TABLET | Freq: Two times a day (BID) | ORAL | Status: DC
  Administered 2020-04-27 – 2020-04-29 (×4): 1 mg via ORAL
  Filled 2020-04-27 (×5): qty 1

## 2020-04-27 MED ORDER — CEFAZOLIN SODIUM-DEXTROSE 2-4 GM/100ML-% IV SOLN
INTRAVENOUS | Status: AC
  Administered 2020-04-27: 2000 mg
  Filled 2020-04-27: qty 100

## 2020-04-27 MED ORDER — CHLORHEXIDINE GLUCONATE CLOTH 2 % EX PADS
6.0000 | MEDICATED_PAD | Freq: Every day | CUTANEOUS | Status: DC
  Administered 2020-04-27: 6 via TOPICAL

## 2020-04-27 MED ORDER — IBUPROFEN 200 MG PO TABS
400.0000 mg | ORAL_TABLET | Freq: Once | ORAL | Status: AC
  Administered 2020-04-27: 400 mg via ORAL
  Filled 2020-04-27: qty 2

## 2020-04-27 MED ORDER — DEXLANSOPRAZOLE 60 MG PO CPDR
60.0000 mg | DELAYED_RELEASE_CAPSULE | Freq: Every day | ORAL | Status: DC
  Administered 2020-04-27 – 2020-04-29 (×3): 60 mg via ORAL
  Filled 2020-04-27 (×3): qty 1

## 2020-04-27 MED ORDER — ACETAMINOPHEN 650 MG RE SUPP: 650.0000 mg | Freq: Four times a day (QID) | RECTAL | Status: DC | PRN

## 2020-04-27 MED ORDER — LEVOTHYROXINE SODIUM 112 MCG PO TABS
112.0000 ug | ORAL_TABLET | Freq: Every day | ORAL | Status: DC
  Administered 2020-04-27 – 2020-04-29 (×3): 112 ug via ORAL

## 2020-04-27 MED ORDER — HEPARIN SODIUM (PORCINE) 5000 UNIT/ML IJ SOLN
5000.0000 [IU] | Freq: Three times a day (TID) | INTRAMUSCULAR | Status: DC
  Filled 2020-04-27 (×2): qty 1

## 2020-04-27 MED ORDER — SODIUM CHLORIDE 0.9 % IV SOLN
1.0000 g | Freq: Three times a day (TID) | INTRAVENOUS | Status: DC
  Administered 2020-04-27: 1 g via INTRAVENOUS
  Filled 2020-04-27: qty 1

## 2020-04-27 MED ORDER — HYDROCORTISONE 10 MG PO TABS
10.0000 mg | ORAL_TABLET | Freq: Every evening | ORAL | Status: DC
  Administered 2020-04-27 – 2020-04-28 (×2): 10 mg via ORAL
  Filled 2020-04-27 (×3): qty 1

## 2020-04-27 MED ORDER — TOPIRAMATE 25 MG PO TABS
150.0000 mg | ORAL_TABLET | Freq: Every day | ORAL | Status: DC
  Administered 2020-04-27 – 2020-04-28 (×3): 150 mg via ORAL
  Filled 2020-04-27 (×4): qty 1

## 2020-04-27 MED ORDER — FUROSEMIDE 20 MG PO TABS
20.0000 mg | ORAL_TABLET | Freq: Every day | ORAL | Status: DC
  Administered 2020-04-27 – 2020-04-29 (×3): 20 mg via ORAL
  Filled 2020-04-27 (×3): qty 1

## 2020-04-27 MED ORDER — ESCITALOPRAM OXALATE 20 MG PO TABS
20.0000 mg | ORAL_TABLET | Freq: Every evening | ORAL | Status: DC
  Administered 2020-04-27 – 2020-04-28 (×2): 20 mg via ORAL
  Filled 2020-04-27 (×2): qty 1

## 2020-04-27 MED ORDER — LIDOCAINE HCL 1 % IJ SOLN
INTRAMUSCULAR | Status: AC
  Filled 2020-04-27: qty 20

## 2020-04-27 MED ORDER — FLUTICASONE FUROATE-VILANTEROL 200-25 MCG/INH IN AEPB
1.0000 | INHALATION_SPRAY | Freq: Every day | RESPIRATORY_TRACT | Status: DC
  Filled 2020-04-27: qty 28

## 2020-04-27 MED ORDER — METOPROLOL SUCCINATE ER 25 MG PO TB24
37.5000 mg | ORAL_TABLET | Freq: Every day | ORAL | Status: DC
  Administered 2020-04-28 – 2020-04-29 (×2): 37.5 mg via ORAL
  Filled 2020-04-27 (×2): qty 2

## 2020-04-27 MED ORDER — LIDOCAINE HCL 1 % IJ SOLN
INTRAMUSCULAR | Status: AC | PRN
  Administered 2020-04-27 (×3): 10 mL

## 2020-04-27 MED ORDER — FENTANYL CITRATE (PF) 100 MCG/2ML IJ SOLN
INTRAMUSCULAR | Status: AC | PRN
  Administered 2020-04-27 (×2): 50 ug via INTRAVENOUS

## 2020-04-27 MED ORDER — DIPHENHYDRAMINE HCL 50 MG/ML IJ SOLN
25.0000 mg | Freq: Four times a day (QID) | INTRAMUSCULAR | Status: DC | PRN
  Administered 2020-04-27 (×3): 25 mg via INTRAVENOUS
  Filled 2020-04-27 (×3): qty 1

## 2020-04-27 MED ORDER — BUDESONIDE 0.25 MG/2ML IN SUSP: 0.2500 mg | Freq: Two times a day (BID) | RESPIRATORY_TRACT | Status: DC

## 2020-04-27 MED ORDER — ACETAMINOPHEN 325 MG PO TABS: 650.0000 mg | ORAL_TABLET | Freq: Four times a day (QID) | ORAL | Status: DC | PRN

## 2020-04-27 MED ORDER — ASPIRIN 325 MG PO TABS
325.0000 mg | ORAL_TABLET | Freq: Every day | ORAL | Status: DC
  Administered 2020-04-27 – 2020-04-28 (×2): 325 mg via ORAL
  Filled 2020-04-27 (×2): qty 1

## 2020-04-27 MED ORDER — ROSUVASTATIN CALCIUM 10 MG PO TABS
10.0000 mg | ORAL_TABLET | Freq: Every day | ORAL | Status: DC
  Administered 2020-04-27 – 2020-04-29 (×3): 10 mg via ORAL
  Filled 2020-04-27 (×3): qty 1

## 2020-04-27 MED ORDER — HYDROMORPHONE HCL 1 MG/ML IJ SOLN
0.5000 mg | INTRAMUSCULAR | Status: DC | PRN
  Administered 2020-04-27 – 2020-04-29 (×15): 0.5 mg via INTRAVENOUS
  Filled 2020-04-27 (×15): qty 0.5

## 2020-04-27 MED ORDER — SODIUM CHLORIDE 0.9 % IV SOLN
2.0000 g | Freq: Three times a day (TID) | INTRAVENOUS | Status: DC
  Administered 2020-04-27 – 2020-04-28 (×3): 2 g via INTRAVENOUS
  Filled 2020-04-27 (×4): qty 2

## 2020-04-27 MED ORDER — ALBUTEROL SULFATE (2.5 MG/3ML) 0.083% IN NEBU: 2.5000 mg | INHALATION_SOLUTION | Freq: Four times a day (QID) | RESPIRATORY_TRACT | Status: DC | PRN

## 2020-04-27 NOTE — Progress Notes (Signed)
Pharmacy Antibiotic Note  [name] is a 49 y.o. female admitted on 04/26/2020 with bacteremia.  Significant PMH including left arm possible vasculopathy vs vasculitis,m recent history of line-associated bacteremia and followed by ID.  In the ED, patient received Vancomycin and Meropenem x 1 dose each.  Upon admission, Pharmacy has been consulted for Meropenem dosing. WBC WNL SCr 1.13, AF  Plan: Increase Meropenem to 2 gm IV q8 (PTA dose per ID) Follow renal function Follow culture results and sensitivities   Height: _0  (165.1 cm) Weight: 88.9 kg (196 lb) IBW/kg (Calculated) : 57  Temp (24hrs), Avg:98.1 F (36.7 C), Min:97.9 F (36.6 C), Max:98.2 F (36.8 C)  Recent Labs  Lab 04/26/20 1659 04/26/20 2015 04/27/20 0613  WBC 8.7  --   --   CREATININE 1.31*  --  1.13*  LATICACIDVEN 1.6 0.9  --     Estimated Creatinine Clearance: 66.4 mL/min (A) (by C-G formula based on SCr of 1.13 mg/dL (H)).    Allergies  Allergen Reactions  . Mushroom Extract Complex Anaphylaxis  . Na Ferric Gluc Cplx In Sucrose Anaphylaxis  . Cymbalta [Duloxetine Hcl] Swelling and Anxiety  . Succinylcholine Other (See Comments)    Lock Jaw  . Buprenorphine Hcl Hives  . Compazine Other (See Comments)    Altered mental status Aggression  . Metoclopramide Other (See Comments)    Dystonia  . Morphine And Related Hives  . Ondansetron Hives and Rash  . Promethazine Hcl Hives  . Tegaderm Ag Mesh [Silver] Rash    Old formulation only, is able to tolerate new formulation    Antimicrobials this admission: 7/27 Vanc x 1 dose 7/27 Meropenem >>    Dose adjustments this admission: 7/29 mero 1 gm q8> 2 gm q8 Microbiology results: 7/27 BCx: sent 7/27 Covid:neg  Thank you for allowing pharmacy to be a part of this patient's care.  Eudelia Bunch, Pharm.D 04/27/2020 8:42 AM

## 2020-04-27 NOTE — Procedures (Signed)
Interventional Radiology Procedure Note  Procedure: Left arm PICC line placement; port removal  Complications: None  Estimated Blood Loss: < 10 mL  Findings: 40 cm DL Power PICC placed via left brachial vein with tip at SVC/RA junction.   Right chest port removed in entirety. Port pocket not grossly infected. Wound closed.  [name]. Kathlene Cote, M.D Pager:  [phone]

## 2020-04-27 NOTE — Progress Notes (Signed)
PROGRESS NOTE  STARLEEN TRUSSELL SWN:[id number] DOB: 10-May-1971 DOA: 04/26/2020 PCP: Su Grand, MD  Brief History   [name] is a 49 y.o. female with medical history significant of adrenal insufficiency on steroids, pituitary tumor status post resection, aortic stenosis, multiple malignancies (breast cancer status post bilateral mastectomy, Hodgkin's lymphoma, thyroid cancer status post resection, cervical cancer -all in remission), pulmonary hypertension, right heart failure, chronic respiratory failure on 3 to 4 L home oxygen, cough variant asthma, anxiety, depression, GERD, prediabetes, hypertension, venous thrombosis, recurrent ulcers and swelling of the left arm thought to be due to possible vasculopathy versus vasculitis (seen by dermatology at [facility] and rheumatology at [facility]), and history of other comorbidities.  Recent history of sphingomonas paucimobilis bacteremia associated with line with attempts to treat through the line due to poor IV access.  Followed by infectious disease.  Patient states she was initially treated with ceftriaxone but continued to have fevers.  She was seen by infectious disease and told that the organism was resistant to ceftriaxone.  Antibiotic was then changed to ertapenem and eventually meropenem but she continued to have fevers.  Last week she was advised by Dr. Drucilla Schmidt to come into the ED for admission as she needs her Port-A-Cath removed.  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  Patient states today she had a temperature of 102.3 F and again advised by ID to come into the ED.  States her hands have been swollen due to her vasculitis and taking Dilaudid helps.  States she has right-sided heart failure and has also had swelling of her feet.  She takes Benadryl for nausea as she is allergic to other antiemetics.  Denies chest pain or shortness of breath.  No other  complaints.   Afebrile.  Tachycardic on arrival, resolved after fluid bolus.  Not hypotensive.  Labs showing no leukocytosis.  Lactic acid normal x2.  BUN 24, creatinine 1.3.  Baseline creatinine 0.9.  Blood culture x2 pending.  UA pending.  Chest x-ray showing no active cardiopulmonary disease.  Patient received Benadryl, Dilaudid, vancomycin, meropenem, and 1 L normal saline bolus.  ED provider discussed the case with Dr. Drucilla Schmidt who recommended giving meropenem only at this time.   The patient has been admitted to a med/surg bed. She is receiving IV Merrem. Infectious disease has been consulted. Interventional radiology has been consulted to remove the patient's port and place new central access for her IV antibiotics.  Consultants   Infectious Disease  Interventional Radiology  Procedures   Removal of port.  Placement of new central access.  Antibiotics   Anti-infectives (From admission, onward)   Start     Dose/Rate Route Frequency Ordered Stop   04/27/20 1540  ceFAZolin (ANCEF) 2-4 GM/100ML-% IVPB       Note to Pharmacy: Hilma Favors   : cabinet override      04/27/20 1540 04/27/20 1615   04/27/20 1400  meropenem (MERREM) 2 g in sodium chloride 0.9 % 100 mL IVPB     Discontinue     2 g 200 mL/hr over 30 Minutes Intravenous Every 8 hours 04/27/20 0837     04/27/20 0600  meropenem (MERREM) 1 g in sodium chloride 0.9 % 100 mL IVPB  Status:  Discontinued        1 g 200 mL/hr over 30 Minutes Intravenous Every 8 hours 04/27/20 0057 04/27/20 0837   04/26/20  2015  vancomycin (VANCOREADY) IVPB 1750 mg/350 mL        1,750 mg 175 mL/hr over 120 Minutes Intravenous  Once 04/26/20 2013 04/26/20 2355   04/26/20 2015  meropenem (MERREM) 1 g in sodium chloride 0.9 % 100 mL IVPB        1 g 200 mL/hr over 30 Minutes Intravenous  Once 04/26/20 2013 04/26/20 2205       Subjective  The patient is resting in bed. She continues to have fevers. No new complaints.  Objective    Vitals:  Vitals:   04/27/20 1940 04/27/20 2012  BP: 119/72 (!) 132/81  Pulse: 90 89  Resp: 18 18  Temp: 98.1 F (36.7 C) 98 F (36.7 C)  SpO2: 97% 98%    Exam:  Constitutional:   The patient is awake, alert, and oriented x 3. Mild distress from acute illness. Respiratory:   No increased work of breathing.  No wheezes, rales, or rhonchi  No tactile fremitus Cardiovascular:   Regular rate and rhythm  No ectopy or gallups.  Positive 4/5 systolic murmur at mid sternal line 3rd intercostal level.   No lateral PMI. No thrills. Abdomen:   Abdomen is soft, non-tender, non-distended  No hernias, masses, or organomegaly  Normoactive bowel sounds.  Musculoskeletal:   No cyanosis, clubbing, or edema Skin:   No rashes, lesions, ulcers  palpation of skin: no induration or nodules Neurologic:   CN 2-12 intact  Sensation all 4 extremities intact Psychiatric:   Mental status o Mood, affect appropriate o Orientation to person, place, time   judgment and insight appear intact  I have personally reviewed the following:   Today's Data   Vitals, BMP  Micro Data   Blood cultures x 1 - no growth  Scheduled Meds:  Chlorhexidine Gluconate Cloth  6 each Topical Daily   clonazePAM  1 mg Oral BID   dexlansoprazole  60 mg Oral Daily   escitalopram  20 mg Oral QPM   fentaNYL       fluticasone furoate-vilanterol  1 puff Inhalation Daily   [START ON 04/28/2020] heparin injection (subcutaneous)  5,000 Units Subcutaneous Q8H   hydrocortisone  10 mg Oral QPM   hydrocortisone  20 mg Oral Daily   levothyroxine  112 mcg Oral QAC breakfast   lidocaine       metoprolol succinate  37.5 mg Oral Daily   midazolam       rosuvastatin  10 mg Oral Daily   topiramate  150 mg Oral QHS   Continuous Infusions:  meropenem (MERREM) IV 2 g (04/27/20 1305)    Principal Problem:   Fever Active Problems:   Hx of Hodgkins lymphoma   History of ductal carcinoma  in situ (DCIS) of breast   History of thyroid cancer   Adrenal insufficiency (HCC)   Hypertension   Raynaud phenomenon   Hyperlipidemia   History of pituitary adenoma   History of cervical cancer   Chronic respiratory failure with hypoxia (HCC)   Superficial thrombophlebitis   Moderate persistent asthma without complication   Port-A-Cath in place   Addison's disease ([place])   Gram-negative bacteremia   AKI (acute kidney injury) ([place])   Vasculopathy   LOS: 1 day   A & P  Sphingomonas paucimobilis bacteremia: Recent history of sphingomonas paucimobilis bacteremia associated with line with attempts to treat through the line due to poor IV access.  Followed by infectious disease.  She was initially treated with ceftriaxone to which the organism  was resistant, antibiotic then changed to ertapenem, and eventually to meropenem.  Patient reports temperature of 102.3 F at home.  Afebrile in the ED.  Tachycardia resolved after fluid bolus.  Labs showing no leukocytosis.  Lactic acid normal x2. -Needs Port-A-Cath removed, however, has poor peripheral IV access due to history of vasculopathy versus vasculitis.  Will need additional IV access/ central line before Port-A-Cath can be removed. Dr. Drucilla Schmidt recommending treating with meropenem only at this time and will consult in a.m.  Repeat blood cultures drawn. The patient has had port removed and alternative access placed today. Will check echocardiogram  AKI: BUN 24, creatinine 1.3.  Baseline creatinine 0.9. Patient received 1 L fluid bolus in the ED.  Avoid nephrotoxic agents and repeat BMP in a.m.  Adrenal insufficiency: Continue hydrocortisone  Hypertension: Currently normotensive. Continue metoprolol.  Hyperlipidemia: Continue Crestor.  Anxiety, depression: Continue Lexapro, Klonopin, Topamax  Insomnia: Continue Ambien as needed at bedtime  Hypothyroidism: Continue Synthroid  Chronic hypoxic respiratory failure: On 3 to 4 L  supplemental oxygen at home. Continue home oxygen.  I have seen and examined this patient myself. I have spent 32 minutes in her evaluation and care.  DVT prophylaxis: Subcutaneous heparin Code Status: Patient wishes to be DNR. Family Communication: No family available at this time. Disposition Plan: Status is: Inpatient  Remains inpatient appropriate because:IV treatments appropriate due to intensity of illness or inability to take PO   Dispo: The patient is from: Home  Anticipated d/c is to: Home  Anticipated d/c date is: > 3 days  Patient currently is not medically stable to d/c.  Lilton Pare, DO [facility] Direct contact: see [email or web address]  7PM-7AM contact night coverage as above 04/27/2020, 8:35 PM  LOS: 1 day

## 2020-04-27 NOTE — Progress Notes (Signed)
Pharmacy Antibiotic Note  [name] is a 49 y.o. female admitted on 04/26/2020 with bacteremia.  Significant PMH including left arm possible vasculopathy vs vasculitis,m recent history of line-associated bacteremia and followed by ID.  In the ED, patient received Vancomycin and Meropenem x 1 dose each.  Upon admission, Pharmacy has been consulted for Meropenem dosing.  Plan: Meropenem 1gm IV q8h Follow renal function Follow culture results and sensitivities   Height: _0  (165.1 cm) Weight: 88.9 kg (196 lb) IBW/kg (Calculated) : 57  Temp (24hrs), Avg:98.2 F (36.8 C), Min:98.2 F (36.8 C), Max:98.2 F (36.8 C)  Recent Labs  Lab 04/26/20 1659 04/26/20 2015  WBC 8.7  --   CREATININE 1.31*  --   LATICACIDVEN 1.6 0.9    Estimated Creatinine Clearance: 57.2 mL/min (A) (by C-G formula based on SCr of 1.31 mg/dL (H)).    Allergies  Allergen Reactions   Mushroom Extract Complex Anaphylaxis   Na Ferric Gluc Cplx In Sucrose Anaphylaxis   Cymbalta [Duloxetine Hcl] Swelling and Anxiety   Succinylcholine Other (See Comments)    Lock Jaw   Buprenorphine Hcl Hives   Compazine Other (See Comments)    Altered mental status Aggression   Metoclopramide Other (See Comments)    Dystonia   Morphine And Related Hives   Ondansetron Hives and Rash   Promethazine Hcl Hives   Tegaderm Ag Mesh [Silver] Rash    Old formulation only, is able to tolerate new formulation    Antimicrobials this admission: 7/27 Vanc x 1 dose 7/27 Meropenem >>    Dose adjustments this admission:   Microbiology results: 7/27 BCx:  7/27 Covid:  Thank you for allowing pharmacy to be a part of this patients care.  Everette Rank, PharmD 04/27/2020 12:57 AM

## 2020-04-27 NOTE — Plan of Care (Signed)
  Problem: Health Behavior/Discharge Planning: Goal: Ability to manage health-related needs will improve Outcome: Progressing   Problem: Nutrition: Goal: Adequate nutrition will be maintained Outcome: Progressing   Problem: Coping: Goal: Level of anxiety will decrease Outcome: Progressing   Problem: Pain Managment: Goal: General experience of comfort will improve Outcome: Progressing

## 2020-04-27 NOTE — Progress Notes (Addendum)
Referring Physician(s): Chauncey  Supervising Physician: Aletta Edouard  Patient Status:  [name] - In[facility]pt  Chief Complaint:  Bacteremia, poor venous access  Subjective: Patient familiar to IR service from left thyroid nodule biopsy in 2005.  She has multiple medical problems including prior Hodgkin's lymphoma, brain/cervical/thyroid cancer, adrenal insufficiency/Addison's disease, migraines, prior pituitary tumor resection, aortic stenosis, pulmonary hypertension, right heart failure, chronic respiratory failure on home O2, asthma, anxiety/depression, GERD, hypertension, vasculopathy/vasculitis, prior right lower extremity superficial vein thrombosis in 2010, right Port-A-Cath placement at [facility] in 2019; she has been followed by ID service for history of sphingomonas paucimobilis bacteremia and presents now with fever.  Since peripheral IV access is challenging due to her vasculitis she was told she likely needed a central line placed and Port-A-Cath removed.  Order now received for the above procedures.  She currently denies chest pain, worsening dyspnea, cough, abdominal/back pain, vomiting or abnormal bleeding.  She does have headache, intermittent nausea.  Additional history as below.  Past Medical History:  Diagnosis Date  . Addison's disease ([place])   . Adrenal insufficiency ([place])   . Anemia   . Anxiety   . Aortic stenosis   . Aortic stenosis   . Appendicitis 12/19/09  . Appendicitis   . Breast cancer ([place])    STATUS POST BILATERAL MASTECTOMY. STATUS POST RECONSTRUCTION. SHE HAD SILICONE BREAST IMPLANTS AND THE LEFT IMPLANT IS LEAKING SLIGHTLY  . Cellulitis of right middle finger 11/07/2018  . Cervical cancer ([place]) 12/23/2018  . Chest pain   . CHF with right heart failure ([place]) 04/17/2017  . Chronic respiratory failure with hypoxia ([place]) 12/23/2018  . Cough variant asthma 04/13/2019  . Depression   . GERD (gastroesophageal reflux disease)   . Headache    migraines  . Heart murmur     . History of kidney stones   . Hodgkin lymphoma (HCC)    STATUS POST MANTLE RADIATION  . Hodgkin's lymphoma ([place])    1987  . Hypertension   . Hypoxia   . Necrotizing fasciitis ([place]) 12/23/2018  . Non-ischemic cardiomyopathy ([place])   . Osteoporosis   . Palpitations   . Pituitary adenoma ([place]) 12/23/2018  . Pneumonia   . PONV (postoperative nausea and vomiting)   . Pre-diabetes    per pt; no meds  . Pulmonary hypertension ([place]) 12/23/2018  . Raynaud phenomenon   . Right heart failure ([place]) 04/17/2017  . Supplemental oxygen dependent    3 liters  . SVT (supraventricular tachycardia) ([place])   . Tachycardia   . Thyroid cancer ([place])    STATUS POST SURGICAL REMOVAL-CURRENT ON THYROID REPLACEMENT   Past Surgical History:  Procedure Laterality Date  . ABDOMINAL HYSTERECTOMY    . AMPUTATION Left 01/30/2019   Procedure: Left Index finger amputation with flap reconstruction and repair reconstruction;  Surgeon: Roseanne Kaufman, MD;  Location: [place];  Service: Orthopedics;  Laterality: Left;  . APPENDECTOMY    . CARDIAC CATHETERIZATION  05/18/09   NORMAL CATH  . I & D EXTREMITY Left 12/23/2018   Procedure: IRRIGATION AND DEBRIDEMENT HAND / INDEX FINGER;  Surgeon: Roseanne Kaufman, MD;  Location: [place];  Service: Orthopedics;  Laterality: Left;  Marland Kitchen MASTECTOMY    . PITUITARY SURGERY    . RIGHT/LEFT HEART CATH AND CORONARY ANGIOGRAPHY N/A 04/02/2018   Procedure: RIGHT/LEFT HEART CATH AND CORONARY ANGIOGRAPHY;  Surgeon: Burnell Blanks, MD;  Location: [place] [facility];  Service: Cardiovascular;  Laterality: N/A;  . TOTAL THYROIDECTOMY    .  VIDEO BRONCHOSCOPY Bilateral 11/14/2018   Procedure: VIDEO BRONCHOSCOPY WITHOUT FLUORO;  Surgeon: Margaretha Seeds, MD;  Location: [place];  Service: Cardiopulmonary;  Laterality: Bilateral;  . VIDEO BRONCHOSCOPY WITH ENDOBRONCHIAL ULTRASOUND N/A 11/19/2018   Procedure: VIDEO BRONCHOSCOPY WITH ENDOBRONCHIAL ULTRASOUND;  Surgeon: Margaretha Seeds, MD;   Location: [place];  Service: Thoracic;  Laterality: N/A;      Allergies: Mushroom extract complex, Na ferric gluc cplx in sucrose, Cymbalta [duloxetine hcl], Succinylcholine, Buprenorphine hcl, Compazine, Metoclopramide, Morphine and related, Ondansetron, Promethazine hcl, and Tegaderm ag mesh [silver]  Medications: Prior to Admission medications   Medication Sig Start Date End Date Taking? Authorizing Provider  albuterol (2.5 MG/3ML) 0.083% NEBU 3 mL, albuterol (5 MG/ML) 0.5% NEBU 0.5 mL Inhale 3 mg into the lungs every 6 (six) hours as needed (SOB).    Yes [provider]  albuterol (VENTOLIN HFA) 108 (90 Base) MCG/ACT inhaler Inhale 2 puffs into the lungs every 6 (six) hours as needed for wheezing or shortness of breath.   Yes [provider]  Alum & Mag Hydroxide-Simeth (GI COCKTAIL) SUSP suspension Take 30 mLs daily as needed by mouth for indigestion (BLEEDING ULCERS). Shake well.   Yes [provider]  aspirin 325 MG tablet Take 325 mg by mouth at bedtime.    Yes [provider]  beclomethasone (QVAR) 80 MCG/ACT inhaler Inhale 1 puff into the lungs 2 (two) times daily.    Yes [provider]  benzonatate (TESSALON) 200 MG capsule Take 1 capsule (200 mg total) by mouth 3 (three) times daily as needed for cough. 04/15/19  Yes Martyn Ehrich, NP  clonazePAM (KLONOPIN) 1 MG tablet Take 1 mg by mouth 2 (two) times daily.    Yes [provider]  colchicine 0.6 MG tablet Take 0.6 mg by mouth daily in the afternoon.  12/31/19  Yes [provider]  dexlansoprazole (DEXILANT) 60 MG capsule Take 60 mg by mouth daily.     Yes [provider]  diphenhydrAMINE (BENADRYL) 50 MG/ML injection Inject 1 mL (50 mg total) into the vein every 6 (six) hours as needed (nausea). 12/25/18  Yes Rai, Ripudeep K, MD  dronabinol (MARINOL) 2.5 MG capsule Take 1 capsule (2.5 mg total) by mouth 2 (two) times daily as needed (nausea). 04/10/20  Yes  Gherghe, Costin M, MD  EPINEPHRINE 0.3 mg/0.3 mL IJ SOAJ injection INJECT 0.3 MLS (0.3 MG TOTAL) INTO THE MUSCLE AS NEEDED FOR ANAPHYLAXIS. Patient taking differently: Inject 0.3 mg into the muscle daily as needed for anaphylaxis.  12/09/19  Yes Margaretha Seeds, MD  escitalopram (LEXAPRO) 20 MG tablet Take 20 mg by mouth every evening.  04/24/16  Yes [provider]  fluticasone furoate-vilanterol (BREO ELLIPTA) 200-25 MCG/INH AEPB Inhale 1 puff into the lungs daily.   Yes [provider]  furosemide (LASIX) 40 MG tablet Take 1 tablet (40 mg total) by mouth in the morning and at bedtime. 04/10/20  Yes Gherghe, Vella Redhead, MD  Galcanezumab-gnlm 120 MG/ML SOAJ Inject 120 mg into the skin every 28 (twenty-eight) days.  03/31/19  Yes [provider]  Heparin Lock Flush (HEPARIN NICU/SCN FLUSH) 1 UNIT/ML SOLN Heparin Lock Flush Solution - 500 USP UNITS/5 mL  #30 pack with 1 refill 02/01/20  Yes Nicholas Lose, MD  hydrocortisone (CORTEF) 10 MG tablet Take 1-2 tablets (10-20 mg total) by mouth See admin instructions. Take 20 mg in the am and 63m in the evening Patient taking differently: Take 10-20 mg by  mouth See admin instructions. Take 20 mg by mouth in the morning and 28m in the evening 02/17/20  Yes Kyle, Tyrone A, DO  levothyroxine (SYNTHROID, LEVOTHROID) 112 MCG tablet Take 112 mcg by mouth daily before breakfast.   Yes [provider]  Mepolizumab (NUCALA) 100 MG SOLR Inject 100 mg into the skin every 28 (twenty-eight) days. 05/13/19  Yes EMargaretha Seeds MD  metoprolol succinate (TOPROL-XL) 25 MG 24 hr tablet Take 37.5 mg by mouth daily.    Yes [provider]  OXYGEN Inhale 4 L into the lungs continuous.    Yes [provider]  Rimegepant Sulfate (NURTEC) 75 MG TBDP Take 75 mg by mouth daily as needed (Migraine).    Yes [provider]  rosuvastatin (CRESTOR) 10 MG tablet Take 10 mg by mouth daily.  02/28/18  Yes [provider]    silver sulfADIAZINE (SILVADENE) 1 % cream Apply 1 application topically daily as needed (Open Wound).  11/16/19  Yes [provider]  sodium chloride 0.9 % infusion Use as directed. 04/19/20  Yes GNicholas Lose MD  sodium chloride flush 0.9 % SOLN injection 10 mL syringes - #30 count with 1 refill 04/19/20  Yes Tanner, VLyndon Code, PA-C  sucralfate (CARAFATE) 1 g tablet Take 1 g by mouth daily as needed (FOR ULCERS).    Yes [provider]  topiramate (TOPAMAX) 50 MG tablet Take 150 mg by mouth at bedtime.  12/25/19  Yes [provider]  zolpidem (AMBIEN CR) 12.5 MG CR tablet Take 12.5 mg by mouth at bedtime.     Yes [provider]  LORazepam (ATIVAN) 0.5 MG tablet TAKE 1-2 TABLETS (0.5-1 MG TOTAL) BY MOUTH 2 (TWO) TIMES DAILY AS NEEDED (NAUSEA). Patient not taking: Reported on 04/26/2020 04/22/20   GNicholas Lose MD     Vital Signs: BP 97/66 (BP Location: Left Arm)   Pulse 89   Temp 97.8 F (36.6 C) (Oral)   Resp 17   Ht 5' 5" (1.651 m)   Wt 196 lb (88.9 kg)   LMP  (LMP Unknown)   SpO2 99%   BMI 32.62 kg/m   Physical Exam awake, alert.  Chest clear to auscultation bilaterally.  Clean, intact right chest wall Port-A-Cath. Heart with regular rate /rhythm, positive murmur.  Abdomen soft, positive bowel sounds, nontender; some pretibial / lower extremity edema noted  Imaging: DG Chest 2 View  Result Date: 04/26/2020 CLINICAL DATA:  49year old female with shortness of breath. Concern for sepsis. EXAM: CHEST - 2 VIEW COMPARISON:  Chest radiograph dated 04/09/2020. FINDINGS: Right-sided Port-A-Cath in similar position. There is no focal consolidation, pleural effusion, or pneumothorax. The cardiac silhouette is within limits. Atherosclerotic calcification of the aorta. No acute osseous pathology. IMPRESSION: No active cardiopulmonary disease. Electronically Signed   By: AAnner CreteM.D.   On: 04/26/2020 19:06    Labs:  CBC: Recent Labs    04/08/20 0408  04/09/20 0402 04/10/20 0422 04/26/20 1659  WBC 9.5 10.0 11.8* 8.7  HGB 10.0* 11.1* 10.5* 12.2  HCT 32.1* 35.4* 33.4* 38.9  PLT 320 362 342 347    COAGS: Recent Labs    07/09/19 1806 04/26/20 1659  INR 0.9 1.0    BMP: Recent Labs    04/09/20 0402 04/10/20 0422 04/26/20 1659 04/27/20 0613  NA 134* 141 142 140  K 3.6 3.2* 3.7 3.8  CL 99 101 101 101  CO2 _0 GLUCOSE 183* 131* 111* 112*  BUN  27* 30* 24* 24*  CALCIUM 9.0 9.2 10.0 9.4  CREATININE 1.09* 1.02* 1.31* 1.13*  GFRNONAA 60* >60 48* 57*  GFRAA >60 >60 55* >60    LIVER FUNCTION TESTS: Recent Labs    04/01/20 1517 04/07/20 0357 04/08/20 0408 04/26/20 1659  BILITOT 0.5 0.3 0.5 0.5  AST _0 ALT 38 35 35 39  ALKPHOS 56 50 54 64  PROT 7.3 6.8 6.9 7.9  ALBUMIN 3.9 3.6 3.7 4.2    Assessment and Plan:  49 yo female with multiple medical problems including prior Hodgkin's lymphoma, brain/cervical/thyroid cancer, adrenal insufficiency/Addison's disease, migraines, prior pituitary tumor resection, aortic stenosis, prediabetes, pulmonary hypertension, right heart failure, chronic respiratory failure on home O2, asthma, anxiety/depression, GERD, hypertension, vasculopathy/vasculitis, prior right lower extremity superficial vein thrombosis in 2010, right Port[facility]-Cath placement at [facility] in 2019; she has been followed by ID service for history of sphingomonas paucimobilis bacteremia (04/01/20) and presents now with fever.  COVID 19 neg; latest blood cx pend; Since peripheral IV access is challenging due to her vasculitis she was told she likely needed a central line placed and Port-A-Cath removed.  Order now received for the above procedures.  Details/risks of above procedures, including but not limited to, internal bleeding, infection, injury to adjacent structures discussed with patient with her understanding and consent.   Electronically Signed: D. Rowe Robert, PA-C 04/27/2020, 11:32 AM   I spent a total  of 20 minutes at the the patient's bedside AND on the patient's hospital floor or unit, greater than 50% of which was counseling/coordinating care for Port-A-Cath removal/central venous catheter placement   Patient ID: [name], female   DOB: 1971-02-07, 49 y.o.   MRN: [id number]

## 2020-04-27 NOTE — TOC Initial Note (Signed)
Transition of Care [facility]) - Initial/Assessment Note    Patient Details  Name: [name] MRN: [id number] Date of Birth: [birth year]  [facility]) CM/SW Contact:    Lynnell Catalan, RN Phone Number: 04/27/2020, 1:21 PM  Clinical Narrative:                 Pt from home with spouse. Was using the services of [facility] for IV infusion services and [facility] for [facility]. Pt has home 02 at baseline as well. Pam from [employer] following along for dc planning along with TOC.  Expected Discharge Plan: [place] Barriers to Discharge: Continued Medical Work up   Expected Discharge Plan and Services Expected Discharge Plan: [place]   Discharge Planning Services: CM Consult   Living arrangements for the past 2 months: Single Family Home                   Prior Living Arrangements/Services Living arrangements for the past 2 months: Single Family Home     Activities of Daily Living Home Assistive Devices/Equipment: Oxygen ADL Screening (condition at time of admission) Patient's cognitive ability adequate to safely complete daily activities?: Yes Is the patient deaf or have difficulty hearing?: No Does the patient have difficulty seeing, even when wearing glasses/contacts?: No Does the patient have difficulty concentrating, remembering, or making decisions?: No Patient able to express need for assistance with ADLs?: Yes Does the patient have difficulty dressing or bathing?: No Independently performs ADLs?: Yes (appropriate for developmental age) Does the patient have difficulty walking or climbing stairs?: No Weakness of Legs: None Weakness of Arms/Hands: None  Admission diagnosis:  Bacteremia [R78.81] Patient Active Problem List   Diagnosis Date Noted  . AKI (acute kidney injury) ([place]) 04/27/2020  . Bacteremia 04/19/2020  . Addison's disease ([place])   . Nausea 04/01/2020  . Generalized weakness 02/10/2020  . Headache  02/10/2020  . Subcutaneous nodule 02/01/2020  . Port-A-Cath in place 09/09/2019  . Moderate persistent asthma without complication 09/73/5329  . Acute superficial venous thrombosis of lower extremity, right 07/16/2019  . Injury of left index finger 12/24/2018  . Pituitary adenoma ([place]) 12/23/2018  . History of cervical cancer 12/23/2018  . Chronic respiratory failure with hypoxia ([place]) 12/23/2018  . SOB (shortness of breath)   . Nephrolithiasis 11/07/2018  . Oxygen dependent 11/07/2018  . Migraine 11/07/2018  . PVC's (premature ventricular contractions) 10/27/2018  . Hyperlipidemia 03/09/2014  . Hx of Hodgkins lymphoma   . History of ductal carcinoma in situ (DCIS) of breast   . Thyroid cancer ([place])   . Adrenal insufficiency ([place])   . Hypertension   . Raynaud phenomenon   . GERD (gastroesophageal reflux disease)    PCP:  Su Grand, MD Pharmacy:   [id number] [place], [place] [place]Phone: [phone]: [phone] [place]Phone: [phone]: [phone]    Social Determinants of Health (SDOH) Interventions    Readmission Risk Interventions Readmission Risk Prevention Plan 04/27/2020  Transportation Screening Complete  PCP or Specialist Appt within 3-5 Days Complete  [place] or [place]  Social Work Consult for RJ[place]do[place]anning/Counseling Complete  Palliative Care Screening Not Applicable  Medication Review (Press photographer Complete  Some recent data might  be hidden

## 2020-04-27 NOTE — Progress Notes (Signed)
Patient ID: [name], female   DOB: [birth year], 49 y.o.   MRN: [id number]         [facility] for Infectious Disease  Date of Admission:  04/26/2020   Total days of antibiotics 26        Day 5 meropenem         ASSESSMENT: It is unclear to me what is causing her intermittent fevers.  The positive blood culture could reflect true bacteremia and infected Port-A-Cath or it could be a simple contaminant.  I agree with Port-A-Cath removal and temporary PICC placement.  If she continues to have intermittent fevers we will need to consider alternative explanations.  PLAN: 1. Continue meropenem 2. Port-A-Cath removal  Principal Problem:   Fever Active Problems:   Port-A-Cath in place   Gram-negative bacteremia   Vasculopathy   Hx of Hodgkins lymphoma   History of ductal carcinoma in situ (DCIS) of breast   History of thyroid cancer   Adrenal insufficiency (HCC)   Hypertension   Raynaud phenomenon   Hyperlipidemia   History of pituitary adenoma   History of cervical cancer   Chronic respiratory failure with hypoxia (HCC)   Superficial thrombophlebitis   Moderate persistent asthma without complication   Addison's disease ([place])   AKI (acute kidney injury) ([place])   Scheduled Meds: . Chlorhexidine Gluconate Cloth  6 each Topical Daily  . clonazePAM  1 mg Oral BID  . dexlansoprazole  60 mg Oral Daily  . escitalopram  20 mg Oral QPM  . fluticasone furoate-vilanterol  1 puff Inhalation Daily  . [START ON 04/28/2020] heparin injection (subcutaneous)  5,000 Units Subcutaneous Q8H  . hydrocortisone  10 mg Oral QPM  . hydrocortisone  20 mg Oral Daily  . levothyroxine  112 mcg Oral QAC breakfast  . lidocaine      . metoprolol succinate  37.5 mg Oral Daily  . rosuvastatin  10 mg Oral Daily  . topiramate  150 mg Oral QHS   Continuous Infusions: . meropenem (MERREM) IV 2 g (04/27/20 1305)   PRN Meds:.acetaminophen **OR** acetaminophen, albuterol, diphenhydrAMINE, HYDROmorphone  (DILAUDID) injection, zolpidem   SUBJECTIVE: Ms. Mcgriff is a 49 year old who has survived multiple cancers and resection of a pituitary adenoma leading to Addison's disease.  She has chronic lung disease due to previous chemotherapy.  She has poor IV access and has had a Port-A-Cath since late 2019.  She has had intermittent bullous skin lesions and superficial thrombophlebitis.  She has been evaluated by dermatology, rheumatology and hematology.  She says that she was told that she had vasculitis (her dermatologist to use the term vasculopathy).  She was started on colchicine earlier this year but does not feel that it has helped.  She was admitted on 04/01/2020 with a history of nausea and fever of 102 degrees.  One blood culture was drawn through her Port-A-Cath and grew Sphingomonas paucimobilis.  She was initially treated with vancomycin and piperacillin tazobactam before being switched to ceftriaxone.  She was discharged home on 04/10/2020 to continue receiving ceftriaxone.  She had no documented fever while hospitalized.  It took several days for her antibiotic susceptibilities come back.  These show that her isolate was resistant to ceftriaxone.  She had intermittent fevers after discharge.  She was switched to IV ertapenem on 04/19/2020 but continued to have fever and was eventually switched to meropenem 5 days ago.  She describes continued nausea and intermittent fevers.  She was readmitted last night.  Review of  Systems: Review of Systems  Constitutional: Positive for fever. Negative for chills, diaphoresis and weight loss.  HENT: Positive for sore throat. Negative for congestion.        Has very dry mouth and feels like she has open throat ulcers.  Eyes: Negative for blurred vision, pain and redness.  Respiratory: Positive for cough, hemoptysis and wheezing. Negative for shortness of breath.        She has had problems with intermittent hemoptysis.  Cardiovascular: Positive for leg swelling.  Negative for chest pain.  Gastrointestinal: Positive for nausea. Negative for abdominal pain, diarrhea and vomiting.  Genitourinary: Negative for dysuria.  Musculoskeletal: Negative for back pain.  Skin:       Intermittent skin lesions presumed secondary to vasculopathy.  Neurological: Negative for headaches.    Allergies  Allergen Reactions  . Mushroom Extract Complex Anaphylaxis  . Na Ferric Gluc Cplx In Sucrose Anaphylaxis  . Cymbalta [Duloxetine Hcl] Swelling and Anxiety  . Succinylcholine Other (See Comments)    Lock Jaw  . Buprenorphine Hcl Hives  . Compazine Other (See Comments)    Altered mental status Aggression  . Metoclopramide Other (See Comments)    Dystonia  . Morphine And Related Hives  . Ondansetron Hives and Rash  . Promethazine Hcl Hives  . Tegaderm Ag Mesh [Silver] Rash    Old formulation only, is able to tolerate new formulation    OBJECTIVE: Vitals:   04/27/20 0038 04/27/20 0430 04/27/20 0955 04/27/20 0957  BP: 123/84 1_0  Pulse: 98 89 89   Resp: 17 17    Temp: 98.2 F (36.8 C) 97.9 F (36.6 C) 97.8 F (36.6 C)   TempSrc: Oral Oral Oral   SpO2: 100% 98% 99%   Weight:      Height:       Body mass index is 32.62 kg/m.  Physical Exam Constitutional:      Comments: She is resting quietly in bed.  Her family distress other than being hungry from being kept n.p.o. for the past 24 hours.  She is talkative.  HENT:     Mouth/Throat:     Mouth: Mucous membranes are moist.     Pharynx: Oropharynx is clear.  Eyes:     Conjunctiva/sclera: Conjunctivae normal.  Cardiovascular:     Rate and Rhythm: Normal rate and regular rhythm.     Heart sounds: No murmur heard.   Pulmonary:     Effort: Pulmonary effort is normal.     Breath sounds: Normal breath sounds.  Chest:    Abdominal:     Palpations: Abdomen is soft. There is no mass.     Tenderness: There is no abdominal tenderness.  Musculoskeletal:     Right lower leg: Edema  present.     Left lower leg: Edema present.     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  Skin:    Comments: She has numerous scars on her hands from previous bullous skin lesions that ulcerate.  She points out a new one on her right ring finger.  Neurological:     General: No focal deficit present.  Psychiatric:        Mood and Affect: Mood normal.     Lab Results Lab Results  Component Value Date   WBC 8.7 04/26/2020   HGB 12.2 04/26/2020   HCT 38.9 04/26/2020   MCV 92.4 04/26/2020   PLT 347 04/26/2020    Lab Results  Component Value Date  CREATININE 1.13 (H) 04/27/2020   BUN 24 (H) 04/27/2020   NA 140 04/27/2020   K 3.8 04/27/2020   CL 101 04/27/2020   CO2 27 04/27/2020    Lab Results  Component Value Date   ALT 39 04/26/2020   AST 29 04/26/2020   ALKPHOS 64 04/26/2020   BILITOT 0.5 04/26/2020     Microbiology: Recent Results (from the past 240 hour(s))  SARS Coronavirus 2 by RT PCR (hospital order, performed in [facility] hospital lab) Nasopharyngeal Nasopharyngeal Swab     Status: None   Collection Time: 04/26/20 11:30 PM   Specimen: Nasopharyngeal Swab  Result Value Ref Range Status   SARS Coronavirus 2 NEGATIVE NEGATIVE Final    Comment: (NOTE) SARS-CoV-2 target nucleic acids are NOT DETECTED.  The SARS-CoV-2 RNA is generally detectable in upper and lower respiratory specimens during the acute phase of infection. The lowest concentration of SARS-CoV-2 viral copies this assay can detect is 250 copies / mL. A negative result does not preclude SARS-CoV-2 infection and should not be used as the sole basis for treatment or other patient management decisions.  A negative result may occur with improper specimen collection / handling, submission of specimen other than nasopharyngeal swab, presence of viral mutation(s) within the areas targeted by this assay, and inadequate number of viral copies (<250 copies / mL). A negative result  must be combined with clinical observations, patient history, and epidemiological information.  Fact Sheet for Patients:   StrictlyIdeas.no  Fact Sheet for Healthcare Providers: BankingDealers.co.za  This test is not yet approved or  cleared by the [place] FDA and has been authorized for detection and/or diagnosis of SARS-CoV-2 by FDA under an Emergency Use Authorization (EUA).  This EUA will remain in effect (meaning this test can be used) for the duration of the COVID-19 declaration under Section 564(b)(1) of the Act, 21 U.S.C. section 360bbb-3(b)(1), unless the authorization is terminated or revoked sooner.  Performed at [facility], [place] [address], [place], [place] [address]     Michel Bickers, [place] for Infectious [place] [facility] [phone] pager   [phone] cell 04/27/2020, 3:14 PM

## 2020-04-27 NOTE — ED Notes (Signed)
Admitting doctor at bedside, unable to transport patient at this time.

## 2020-04-28 ENCOUNTER — Inpatient Hospital Stay (HOSPITAL_COMMUNITY): Payer: BC Managed Care – PPO

## 2020-04-28 DIAGNOSIS — I361 Nonrheumatic tricuspid (valve) insufficiency: Secondary | ICD-10-CM

## 2020-04-28 DIAGNOSIS — I351 Nonrheumatic aortic (valve) insufficiency: Secondary | ICD-10-CM

## 2020-04-28 LAB — ECHOCARDIOGRAM COMPLETE
Area-P 1/2: 3.83 cm2
Height: 65 in
S' Lateral: 2.8 cm
Weight: 3136 oz

## 2020-04-28 MED ORDER — GALCANEZUMAB-GNLM 120 MG/ML ~~LOC~~ SOAJ: 120.0000 mg | SUBCUTANEOUS | Status: DC

## 2020-04-28 MED ORDER — GUAIFENESIN ER 600 MG PO TB12
600.0000 mg | ORAL_TABLET | Freq: Two times a day (BID) | ORAL | Status: DC
  Administered 2020-04-29 (×2): 600 mg via ORAL
  Filled 2020-04-28 (×2): qty 1

## 2020-04-28 MED ORDER — FLUCONAZOLE 100 MG PO TABS
100.0000 mg | ORAL_TABLET | Freq: Every day | ORAL | Status: DC
  Administered 2020-04-28 – 2020-04-29 (×2): 100 mg via ORAL
  Filled 2020-04-28 (×2): qty 1

## 2020-04-28 MED ORDER — HYDROCOD POLST-CPM POLST ER 10-8 MG/5ML PO SUER
5.0000 mL | Freq: Two times a day (BID) | ORAL | Status: DC | PRN
  Filled 2020-04-28: qty 5

## 2020-04-28 MED ORDER — PERFLUTREN LIPID MICROSPHERE
1.0000 mL | INTRAVENOUS | Status: AC | PRN
  Administered 2020-04-28: 2 mL via INTRAVENOUS
  Filled 2020-04-28: qty 10

## 2020-04-28 MED ORDER — BENZONATATE 100 MG PO CAPS
200.0000 mg | ORAL_CAPSULE | Freq: Three times a day (TID) | ORAL | Status: DC | PRN
  Administered 2020-04-29 (×2): 200 mg via ORAL
  Filled 2020-04-28 (×2): qty 2

## 2020-04-28 MED ORDER — SODIUM CHLORIDE 0.9 % IV SOLN
2.0000 g | Freq: Three times a day (TID) | INTRAVENOUS | Status: DC
  Administered 2020-04-28 – 2020-04-29 (×3): 2 g via INTRAVENOUS
  Filled 2020-04-28 (×6): qty 2

## 2020-04-28 NOTE — Progress Notes (Signed)
Patient ID: [name], female   DOB: 09-14-1971, 49 y.o.   MRN: [id number]         [facility] for Infectious Disease  Date of Admission:  04/26/2020   Total days of antibiotics 27        Day 6 meropenem         ASSESSMENT: She has not had any fever during recent admissions.  Blood cultures on 04/06/2020 and 04/26/2020 are negative.  If the latest blood culture remains negative and she is not having any further fever I would recommend stopping meropenem tomorrow.  PLAN: 1. Continue meropenem for now 2. Await results of latest blood cultures  Principal Problem:   Fever Active Problems:   Port-A-Cath in place   Gram-negative bacteremia   Vasculopathy   Hx of Hodgkins lymphoma   History of ductal carcinoma in situ (DCIS) of breast   History of thyroid cancer   Adrenal insufficiency (HCC)   Hypertension   Raynaud phenomenon   Hyperlipidemia   History of pituitary adenoma   History of cervical cancer   Chronic respiratory failure with hypoxia (HCC)   Superficial thrombophlebitis   Moderate persistent asthma without complication   Addison's disease ([place])   AKI (acute kidney injury) ([place])   Scheduled Meds: . aspirin  325 mg Oral QHS  . Chlorhexidine Gluconate Cloth  6 each Topical Daily  . clonazePAM  1 mg Oral BID  . dexlansoprazole  60 mg Oral Daily  . escitalopram  20 mg Oral QPM  . fluticasone furoate-vilanterol  1 puff Inhalation Daily  . furosemide  20 mg Oral Daily  . Galcanezumab-gnlm  120 mg Subcutaneous Q28 days  . heparin injection (subcutaneous)  5,000 Units Subcutaneous Q8H  . hydrocortisone  10 mg Oral QPM  . hydrocortisone  20 mg Oral Daily  . levothyroxine  112 mcg Oral QAC breakfast  . metoprolol succinate  37.5 mg Oral Daily  . rosuvastatin  10 mg Oral Daily  . topiramate  150 mg Oral QHS   Continuous Infusions: . meropenem (MERREM) IV     PRN Meds:.acetaminophen **OR** acetaminophen, albuterol, diphenhydrAMINE, HYDROmorphone (DILAUDID)  injection, zolpidem   SUBJECTIVE: She had her Port-A-Cath removed yesterday and is very sore at the site.  Review of Systems: Review of Systems  Constitutional: Negative for chills, diaphoresis and fever.  Musculoskeletal: Positive for joint pain.  Neurological: Positive for headaches.    Allergies  Allergen Reactions  . Mushroom Extract Complex Anaphylaxis  . Na Ferric Gluc Cplx In Sucrose Anaphylaxis  . Cymbalta [Duloxetine Hcl] Swelling and Anxiety  . Succinylcholine Other (See Comments)    Lock Jaw  . Buprenorphine Hcl Hives  . Compazine Other (See Comments)    Altered mental status Aggression  . Metoclopramide Other (See Comments)    Dystonia  . Morphine And Related Hives  . Ondansetron Hives and Rash  . Promethazine Hcl Hives  . Tegaderm Ag Mesh [Silver] Rash    Old formulation only, is able to tolerate new formulation    OBJECTIVE: Vitals:   04/27/20 2012 04/27/20 2048 04/27/20 2137 04/28/20 0606  BP: (!) 132/81 (!) 137/79 122/71 105/73  Pulse: 89 96 98 82  Resp: _0 Temp: 98 F (36.7 C) (!) 97.5 F (36.4 C) 98.1 F (36.7 C) 98 F (36.7 C)  TempSrc: Oral Oral Oral Oral  SpO2: 98% 100% 100% 96%  Weight:      Height:       Body mass index  is 32.62 kg/m.  Physical Exam Constitutional:      Comments: She is resting quietly in bed.  Chest:    Skin:    Comments: New left arm PICC.     Lab Results Lab Results  Component Value Date   WBC 8.7 04/26/2020   HGB 12.2 04/26/2020   HCT 38.9 04/26/2020   MCV 92.4 04/26/2020   PLT 347 04/26/2020    Lab Results  Component Value Date   CREATININE 1.13 (H) 04/27/2020   BUN 24 (H) 04/27/2020   NA 140 04/27/2020   K 3.8 04/27/2020   CL 101 04/27/2020   CO2 27 04/27/2020    Lab Results  Component Value Date   ALT 39 04/26/2020   AST 29 04/26/2020   ALKPHOS 64 04/26/2020   BILITOT 0.5 04/26/2020     Microbiology: Recent Results (from the past 240 hour(s))  Culture, blood (Routine x  2)     Status: None (Preliminary result)   Collection Time: 04/26/20  4:59 PM   Specimen: BLOOD  Result Value Ref Range Status   Specimen Description   Final    BLOOD PORTA CATH Performed at [facility], [place] [address], [place], [place] [address]    Special Requests   Final    BOTTLES DRAWN AEROBIC AND ANAEROBIC Blood Culture adequate volume Performed at [place] [address], [place], [place] [address]    Culture   Final    NO GROWTH 2 DAYS Performed at [place] [address] [place], [place] [address]    Report Status PENDING  Incomplete  SARS Coronavirus 2 by RT PCR (hospital order, performed in [facility] hospital lab) Nasopharyngeal Nasopharyngeal Swab     Status: None   Collection Time: 04/26/20 11:30 PM   Specimen: Nasopharyngeal Swab  Result Value Ref Range Status   SARS Coronavirus 2 NEGATIVE NEGATIVE Final    Comment: (NOTE) SARS-CoV-2 target nucleic acids are NOT DETECTED.  The SARS-CoV-2 RNA is generally detectable in upper and lower respiratory specimens during the acute phase of infection. The lowest concentration of SARS-CoV-2 viral copies this assay can detect is 250 copies / mL. A negative result does not preclude SARS-CoV-2 infection and should not be used as the sole basis for treatment or other patient management decisions.  A negative result may occur with improper specimen collection / handling, submission of specimen other than nasopharyngeal swab, presence of viral mutation(s) within the areas targeted by this assay, and inadequate number of viral copies (<250 copies / mL). A negative result must be combined with clinical observations, patient history, and epidemiological information.  Fact Sheet for Patients:   StrictlyIdeas.no  Fact Sheet for Healthcare Providers: BankingDealers.co.za  This test is not yet approved or  cleared by the [place] FDA and has been authorized for detection and/or diagnosis of SARS-CoV-2 by FDA under an Emergency Use Authorization (EUA).  This EUA will remain in effect (meaning this test can be used) for the duration of the COVID-19 declaration under Section 564(b)(1) of the Act, 21 U.S.C. section 360bbb-3(b)(1), unless the authorization is terminated or revoked sooner.  Performed at [facility], [place] [address], [place], [place] [address]     Michel Bickers, [place] for Infectious [place] [phone] pager   [phone] cell 04/28/2020, 1:40 PM

## 2020-04-28 NOTE — Progress Notes (Signed)
  Echocardiogram 2D Echocardiogram with definity has been performed.  Darlina Sicilian M 04/28/2020, 2:21 PM

## 2020-04-28 NOTE — Progress Notes (Signed)
PROGRESS NOTE  [name] VEH:[id number] DOB: [birth year] DOA: 04/26/2020 PCP: Su Grand, MD  Brief History   [name] is a 49 y.o. 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  Recent history of sphingomonas paucimobilis bacteremia associated with line with attempts to treat through the line due to poor IV access.  Followed by infectious disease.  Patient states she was initially treated with ceftriaxone but continued to have fevers.  She was seen by infectious disease and told that the organism was resistant to ceftriaxone.  Antibiotic was then changed to ertapenem and eventually meropenem but she continued to have fevers.  Last week she was advised by Dr. Drucilla Schmidt to come into the ED for admission as she needs her Port-A-Cath removed.  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  Patient states today she had a temperature of 102.3 F and again advised by ID to come into the ED.  States her hands have been swollen due to her vasculitis and taking Dilaudid helps.  States she has right-sided heart failure and has also had swelling of her feet.  She takes Benadryl for nausea as she is allergic to other antiemetics.  Denies chest pain or shortness of breath.  No other  complaints.   Afebrile.  Tachycardic on arrival, resolved after fluid bolus.  Not hypotensive.  Labs showing no leukocytosis.  Lactic acid normal x2.  BUN 24, creatinine 1.3.  Baseline creatinine 0.9.  Blood culture x2 pending.  UA pending.  Chest x-ray showing no active cardiopulmonary disease.  Patient received Benadryl, Dilaudid, vancomycin, meropenem, and 1 L normal saline bolus.  ED provider discussed the case with Dr. Drucilla Schmidt who recommended giving meropenem only at this time.   The patient has been admitted to a med/surg bed. She is receiving IV Merrem. Infectious disease has been consulted. Interventional radiology has removed the patient's port and placed new central access for her IV antibiotics.   Repeat blood cultures have been drawn. They have had no growth so far. Infectious disease recommends following the patient for culture growth and fevers for another day. If still no growth/fevers tomorrow, then Merrem may be discontinued.  Consultants  . Infectious Disease . Interventional Radiology  Procedures  . Removal of port. . Placement of new central access.  Antibiotics   Anti-infectives (From admission, onward)   Start     Dose/Rate Route Frequency Ordered Stop   04/28/20 1400  meropenem (MERREM) 2 g in sodium chloride 0.9 % 100 mL IVPB     Discontinue     2 g 200 mL/hr over 30 Minutes Intravenous Every 8 hours 04/28/20 0953     04/27/20 1540  ceFAZolin (ANCEF) 2-4 GM/100ML-% IVPB       Note to Pharmacy: Hilma Favors   : cabinet override      04/27/20 1540 04/27/20 1615   04/27/20 1400  meropenem (MERREM) 2 g in  sodium chloride 0.9 % 100 mL IVPB  Status:  Discontinued        2 g 200 mL/hr over 30 Minutes Intravenous Every 8 hours 04/27/20 0837 04/28/20 0952   04/27/20 0600  meropenem (MERREM) 1 g in sodium chloride 0.9 % 100 mL IVPB  Status:  Discontinued        1 g 200 mL/hr over 30 Minutes Intravenous Every 8 hours 04/27/20 0057 04/27/20 0837   04/26/20 2015   vancomycin (VANCOREADY) IVPB 1750 mg/350 mL        1,750 mg 175 mL/hr over 120 Minutes Intravenous  Once 04/26/20 2013 04/26/20 2355   04/26/20 2015  meropenem (MERREM) 1 g in sodium chloride 0.9 % 100 mL IVPB        1 g 200 mL/hr over 30 Minutes Intravenous  Once 04/26/20 2013 04/26/20 2205      Subjective  The patient is resting in bed. No fevers in the last 24 hours. No new complaints.  Objective   Vitals:  Vitals:   04/28/20 0606 04/28/20 1400  BP: 105/73 (!) 124/87  Pulse: 82 87  Resp: 17 18  Temp: 98 F (36.7 C) 98.2 F (36.8 C)  SpO2: 96% 95%    Exam:  Constitutional:  . The patient is awake, alert, and oriented x 3. Mild distress from migraine. Respiratory:  . No increased work of breathing. . No wheezes, rales, or rhonchi . No tactile fremitus Cardiovascular:  . Regular rate and rhythm . No ectopy or gallups. . Positive 4/5 systolic murmur at mid sternal line 3rd intercostal level.  . No lateral PMI. No thrills. Abdomen:  . Abdomen is soft, non-tender, non-distended . No hernias, masses, or organomegaly . Normoactive bowel sounds.  Musculoskeletal:  . No cyanosis, clubbing, or edema Skin:  . No rashes, lesions, ulcers . palpation of skin: no induration or nodules Neurologic:  . CN 2-12 intact . Sensation all 4 extremities intact Psychiatric:  . Mental status o Mood, affect appropriate o Orientation to person, place, time  . judgment and insight appear intact  I have personally reviewed the following:   Today's Data  . Vitals, BMP  Micro Data  . Blood cultures x 1 - no growth . Echocardiogram: Pending.  Scheduled Meds: . aspirin  325 mg Oral QHS  . Chlorhexidine Gluconate Cloth  6 each Topical Daily  . clonazePAM  1 mg Oral BID  . dexlansoprazole  60 mg Oral Daily  . escitalopram  20 mg Oral QPM  . fluticasone furoate-vilanterol  1 puff Inhalation Daily  . furosemide  20 mg Oral Daily  . Galcanezumab-gnlm  120 mg Subcutaneous Q28 days   . heparin injection (subcutaneous)  5,000 Units Subcutaneous Q8H  . hydrocortisone  10 mg Oral QPM  . hydrocortisone  20 mg Oral Daily  . levothyroxine  112 mcg Oral QAC breakfast  . metoprolol succinate  37.5 mg Oral Daily  . rosuvastatin  10 mg Oral Daily  . topiramate  150 mg Oral QHS   Continuous Infusions: . meropenem (MERREM) IV Stopped (04/28/20 1538)    Principal Problem:   Fever Active Problems:   Hx of Hodgkins lymphoma   History of ductal carcinoma in situ (DCIS) of breast   History of thyroid cancer   Adrenal insufficiency (HCC)   Hypertension   Raynaud phenomenon   Hyperlipidemia   History of pituitary adenoma   History of cervical cancer   Chronic respiratory failure with hypoxia (HCC)   Superficial thrombophlebitis  Moderate persistent asthma without complication   Port-A-Cath in place   Addison's disease ([place])   Gram-negative bacteremia   AKI (acute kidney injury) ([place])   Vasculopathy   LOS: 2 days   A & P  Sphingomonas paucimobilis bacteremia: Recent history of sphingomonas paucimobilis bacteremia associated with line with attempts to treat through the line due to poor IV access.  Followed by infectious disease.  She was initially treated with ceftriaxone to which the organism was resistant, antibiotic then changed to ertapenem, and eventually to meropenem.  Patient reports temperature of 102.3 F at home.  Afebrile in the ED.  Tachycardia resolved after fluid bolus.  Labs showing no leukocytosis.  Lactic acid normal x2. -Needs Port-A-Cath removed, however, has poor peripheral IV access due to history of vasculopathy versus vasculitis.  Will need additional IV access/ central line before Port-A-Cath can be removed. Dr. Drucilla Schmidt recommending treating with meropenem only at this time and will consult in a.m.  Repeat blood cultures drawn. The patient has had port removed and alternative access placed today. Echocardiogram is pending. Continue to follow fever curve  and blood cultures. Discontinue Merrem tomorrow if no fever, and no growth.  AKI: BUN 24, creatinine 1.13.  Baseline creatinine 0.9. Patient received 1 L fluid bolus in the ED.  Avoid nephrotoxic agents and hypotension. Monitor creatinine, electrolytes, and volume status.  Adrenal insufficiency: Continue hydrocortisone  Hypertension: Currently normotensive. Continue metoprolol.  Hyperlipidemia: Continue Crestor.  Anxiety, depression: Continue Lexapro, Klonopin, Topamax  Insomnia: Continue Ambien as needed at bedtime  Hypothyroidism: Continue Synthroid  Chronic hypoxic respiratory failure: On 3 to 4 L supplemental oxygen at home. Continue home oxygen.  Migraines: Restart Emgaly as at home.  I have seen and examined this patient myself. I have spent 30 minutes in her evaluation and care.  DVT prophylaxis: Subcutaneous heparin Code Status: Patient wishes to be DNR. Family Communication: No family available at this time. Disposition Plan: Status is: Inpatient  Remains inpatient appropriate because:IV treatments appropriate due to intensity of illness or inability to take PO  Dispo: The patient is from: Home  Anticipated d/c is to: Home  Anticipated d/c date is: > 3 days  Patient currently is not medically stable to d/c.  Marjorie Lussier, DO [facility] Direct contact: see [email or web address]  7PM-7AM contact night coverage as above 04/28/2020, 6:14 PM  LOS: 1 day

## 2020-04-29 DIAGNOSIS — I809 Phlebitis and thrombophlebitis of unspecified site: Secondary | ICD-10-CM

## 2020-04-29 MED ORDER — HEPARIN SOD (PORK) LOCK FLUSH 100 UNIT/ML IV SOLN
500.0000 [IU] | INTRAVENOUS | Status: DC
  Filled 2020-04-29: qty 5

## 2020-04-29 MED ORDER — GUAIFENESIN ER 600 MG PO TB12: 1200.0000 mg | ORAL_TABLET | Freq: Two times a day (BID) | ORAL | 0 refills | Status: AC

## 2020-04-29 MED ORDER — HEPARIN SOD (PORK) LOCK FLUSH 100 UNIT/ML IV SOLN
500.0000 [IU] | INTRAVENOUS | Status: DC | PRN
  Filled 2020-04-29: qty 5

## 2020-04-29 MED ORDER — FLUCONAZOLE 100 MG PO TABS: 100.0000 mg | ORAL_TABLET | Freq: Every day | ORAL | 0 refills | Status: AC

## 2020-04-29 NOTE — Progress Notes (Addendum)
Patient ID: [name], female   DOB: 1971-03-11, 49 y.o.   MRN: [id number]         [facility]  Date of Admission:  04/26/2020   Total days of antibiotics 28        Day 7 meropenem         ASSESSMENT: She has not had any fever during recent admissions.  Blood cultures on 04/06/2020 and 04/26/2020 remain negative.  I recommend stopping meropenem now and sending her home off of antibiotics.  I would have her leave the PICC line in for now.  She already has follow-up arranged in our clinic.  Once we know she is no longer having any fever she can have her Port-A-Cath replaced and the PICC removed.  PLAN: 1. Discontinue meropenem and discharge home 2. 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 aspirin  325 mg Oral QHS  . Chlorhexidine Gluconate Cloth  6 each Topical Daily  . clonazePAM  1 mg Oral BID  . dexlansoprazole  60 mg Oral Daily  . escitalopram  20 mg Oral QPM  . fluconazole  100 mg Oral Daily  . fluticasone furoate-vilanterol  1 puff Inhalation Daily  . furosemide  20 mg Oral Daily  . Galcanezumab-gnlm  120 mg Subcutaneous Q28 days  . guaiFENesin  600 mg Oral BID  . heparin injection (subcutaneous)  5,000 Units Subcutaneous Q8H  . heparin lock flush  500 Units Intracatheter Q30 days  . hydrocortisone  10 mg Oral QPM  . hydrocortisone  20 mg Oral Daily  . levothyroxine  112 mcg Oral QAC  breakfast  . metoprolol succinate  37.5 mg Oral Daily  . rosuvastatin  10 mg Oral Daily  . topiramate  150 mg Oral QHS   Continuous Infusions: . meropenem (MERREM) IV 2 g (04/29/20 0508)   PRN Meds:.acetaminophen **OR** acetaminophen, albuterol, benzonatate, chlorpheniramine-HYDROcodone, diphenhydrAMINE, heparin lock flush **AND** heparin lock flush, HYDROmorphone (DILAUDID) injection, zolpidem   SUBJECTIVE: She thinks there is some swelling around where her Port-A-Cath was removed.  Review of Systems: Review of Systems  Constitutional: Negative for chills, diaphoresis and fever.  Musculoskeletal: Positive for joint pain.  Neurological: Negative for headaches.    Allergies  Allergen Reactions  . Mushroom Extract Complex Anaphylaxis  . Na Ferric Gluc Cplx In Sucrose Anaphylaxis  . Cymbalta [Duloxetine Hcl] Swelling and Anxiety  . Succinylcholine Other (See Comments)    Lock Jaw  . Buprenorphine Hcl Hives  . Compazine Other (See Comments)    Altered mental status Aggression  . Metoclopramide Other (See Comments)    Dystonia  . Morphine And Related Hives  . Ondansetron Hives and Rash  . Promethazine Hcl Hives  . Tegaderm Ag Mesh [Silver] Rash    Old formulation only, is able to tolerate new formulation    OBJECTIVE: Vitals:   04/28/20 0606 04/28/20 1400 04/28/20 2047  04/29/20 0559  BP: 105/73 (!) 124/87 (!) 131/90 (!) 126/86  Pulse: 82 87 96 89  Resp: _0 Temp: 98 F (36.7 C) 98.2 F (36.8 C) 98 F (36.7 C) (!) 97.5 F (36.4 C)  TempSrc: Oral Oral Oral Oral  SpO2: 96% 95% 98% 99%  Weight:      Height:       Body mass index is 32.62 kg/m.  Physical Exam Constitutional:      Comments: She is resting quietly in bed.  Chest:    Skin:    Comments: New left arm PICC.     Lab Results Lab Results  Component Value Date   WBC 8.7 04/26/2020   HGB 12.2 04/26/2020   HCT 38.9 04/26/2020   MCV 92.4 04/26/2020   PLT 347 04/26/2020    Lab Results    Component Value Date   CREATININE 1.13 (H) 04/27/2020   BUN 24 (H) 04/27/2020   NA 140 04/27/2020   K 3.8 04/27/2020   CL 101 04/27/2020   CO2 27 04/27/2020    Lab Results  Component Value Date   ALT 39 04/26/2020   AST 29 04/26/2020   ALKPHOS 64 04/26/2020   BILITOT 0.5 04/26/2020     Microbiology: Recent Results (from the past 240 hour(s))  Culture, blood (Routine x 2)     Status: None (Preliminary result)   Collection Time: 04/26/20  4:59 PM   Specimen: BLOOD  Result Value Ref Range Status   Specimen Description   Final    BLOOD PORTA CATH Performed at [facility], [place] [address], [place], [place] [address]    Special Requests   Final    BOTTLES DRAWN AEROBIC AND ANAEROBIC Blood Culture adequate volume Performed at [place] [address], [place], [place] [address]    Culture   Final    NO GROWTH 2 DAYS Performed at [place] [address] [place], [place] [address]    Report Status PENDING  Incomplete  SARS Coronavirus 2 by RT PCR (hospital order, performed in [facility] hospital lab) Nasopharyngeal Nasopharyngeal Swab     Status: None   Collection Time: 04/26/20 11:30 PM   Specimen: Nasopharyngeal Swab  Result Value Ref Range Status   SARS Coronavirus 2 NEGATIVE NEGATIVE Final    Comment: (NOTE) SARS-CoV-2 target nucleic acids are NOT DETECTED.  The SARS-CoV-2 RNA is generally detectable in upper and lower respiratory specimens during the acute phase of infection. The lowest concentration of SARS-CoV-2 viral copies this assay can detect is 250 copies / mL. A negative result does not preclude SARS-CoV-2 infection and should not be used as the sole basis for treatment or other patient management decisions.  A negative result may occur with improper specimen collection / handling, submission of specimen other than nasopharyngeal swab, presence of viral mutation(s) within the areas targeted by this assay,  and inadequate number of viral copies (<250 copies / mL). A negative result must be combined with clinical observations, patient history, and epidemiological information.  Fact Sheet for Patients:   StrictlyIdeas.no  Fact Sheet for Healthcare Providers: BankingDealers.co.za  This test is not yet approved or  cleared by the [place] FDA and has been authorized for detection and/or diagnosis of SARS-CoV-2 by FDA under an Emergency Use Authorization (EUA).  This EUA will remain in effect (meaning this test can be used) for the duration of the COVID-19 declaration under Section 564(b)(1) of the  Act, 21 U.S.C. section 360bbb-3(b)(1), unless the authorization is terminated or revoked sooner.  Performed at [facility], [place] [address], [place], [place] [address]     Michel Bickers, [place] [facility] [phone] pager   [phone] cell 04/29/2020, 2:29 PM

## 2020-04-29 NOTE — Progress Notes (Signed)
Pt discharged home with spouse in stable condition. Discharge instructions given. Pt verbalized understanding. No immediate questions or concerns at this time. Pt opted to ambulate off unit.

## 2020-04-29 NOTE — Discharge Summary (Signed)
Physician Discharge Summary  [name] TLX:[id number] DOB: 18-Sep-1971 DOA: 04/26/2020  PCP: Su Grand, MD  Admit date: 04/26/2020 Discharge date: 04/29/2020  Recommendations for Outpatient Follow-up:  1. Discharge to home. 2. Follow up with Dr. Baxter Flattery on 05/03/2020. Keep appointment. 3. Follow up with PCP in 7-10 days. Have CBC, chemistry drawn at that visit.  Discharge Diagnoses: Principal diagnosis is #1 1. 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 2. AKI 3. Lower extremity edema 4. Heart murmur 5. Adrenal insufficiency 6. Depression 7. Hypertension 8. Hyperlipidemia 9. Anxiety 10. Hypothyroidism 11. Chronic hypoxic respiratory failure  Discharge Condition: Fair  Disposition: Home  Diet recommendation: Heart healthy  Filed Weights   04/26/20 1616  Weight: 88.9 kg    History of present illness: [name] is a 49 y.o. 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  Recent history of sphingomonas paucimobilis bacteremia associated with line with attempts to treat through the line due to poor IV access.  Followed by infectious disease.  Patient states she was initially treated with ceftriaxone but continued to have fevers.  She was seen by infectious disease and told that the organism was  resistant to ceftriaxone.  Antibiotic was then changed to ertapenem and eventually meropenem but she continued to have fevers.  Last week she was advised by Dr. Drucilla Schmidt to come into the ED for admission as she needs her Port-A-Cath removed.  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  Patient states today she had a temperature of 102.3 F and again advised by ID to come into the ED.  States her hands have been swollen due to her vasculitis and taking Dilaudid helps.  States she has right-sided heart failure and has also had swelling of her feet.  She takes Benadryl for nausea as she is allergic to other antiemetics.  Denies chest pain or shortness of breath.  No other complaints.  ED Course: Afebrile.  Tachycardic on arrival, resolved after fluid bolus.  Not hypotensive.  Labs showing no leukocytosis.  Lactic acid normal x2.  BUN 24, creatinine 1.3.  Baseline creatinine 0.9.  Blood culture x2 pending.  UA pending.  Chest x-ray showing no active cardiopulmonary disease.  Patient received Benadryl, Dilaudid, vancomycin, meropenem, and 1 L normal saline bolus.   Hospital Course:  The patient was admitted to a telemetry bed. She was continued on meropenem. Infectious disease was consulted. Repeat blood culture was  obtained on 04/26/2020.  The patient was sent to IR to have the port removed and PICC placed. The patient also underwent an echocardiogram. It demonstrated no significant change compared to previous echo on 02/13/2020. The repeat blood culture demonstrated no growth, and the patient had no further fevers. IV meropenem was stopped as per infectious disease. The patient will follow up with Carlyle Basques, MD on 05/03/2020.  She was discharged to home in fair condition.  Today's assessment:  S: The patient is resting comfortably. No new complaints. O: Vitals:  Vitals:   04/28/20 2047 04/29/20 0559  BP: (!) 131/90 (!)  126/86  Pulse: 96 89  Resp: 18 18  Temp: 98 F (36.7 C) (!) 97.5 F (36.4 C)  SpO2: 98% 99%   Exam:  Constitutional:   The patient is awake, alert, and oriented x 3. Mild distress from migraine. Respiratory:   No increased work of breathing.  No wheezes, rales, or rhonchi  No tactile fremitus Cardiovascular:   Regular rate and rhythm  No ectopy or gallups.  Positive 4/5 systolic murmur at mid sternal line 3rd intercostal level.   No lateral PMI. No thrills. Abdomen:   Abdomen is soft, non-tender, non-distended  No hernias, masses, or organomegaly  Normoactive bowel sounds.  Musculoskeletal:   No cyanosis, clubbing, or edema Skin:   No rashes, lesions, ulcers  palpation of skin: no induration or nodules Neurologic:   CN 2-12 intact  Sensation all 4 extremities intact Psychiatric:   Mental status ? Mood, affect appropriate ? Orientation to person, place, time   judgment and insight appear intact   Discharge Instructions  Discharge Instructions    Activity as tolerated - No restrictions   Complete by: As directed    Call MD for:  difficulty breathing, headache or visual disturbances   Complete by: As directed    Call MD for:  redness, tenderness, or signs of infection (pain, swelling, redness, odor or green/yellow discharge around incision site)   Complete by: As directed    Call MD for:  temperature >100.4   Complete by: As directed    Diet - low sodium heart healthy   Complete by: As directed    Discharge instructions   Complete by: As directed    Discharge to home. Follow up with Dr. Baxter Flattery on 05/03/2020. Keep appointment. Follow up with PCP in 7-10 days. Have CBC, chemistry drawn at that visit.   Discharge wound care:   Complete by: As directed    Cover port site and PICC with plastic for showers, etc.   Increase activity slowly   Complete by: As directed           Allergies  Allergen Reactions  . Mushroom Extract Complex  Anaphylaxis  . Na Ferric Gluc Cplx In Sucrose Anaphylaxis  . Cymbalta [Duloxetine Hcl] Swelling and Anxiety  . Succinylcholine Other (See Comments)    Lock Jaw  . Buprenorphine Hcl Hives  . Compazine Other (See Comments)    Altered mental status Aggression  . Metoclopramide Other (See Comments)    Dystonia  . Morphine And Related Hives  . Ondansetron Hives and Rash  . Promethazine Hcl Hives  . Tegaderm Ag Mesh [Silver] Rash    Old formulation only, is able to tolerate new formulation    The results of significant diagnostics from this hospitalization (including imaging, microbiology, ancillary and laboratory) are listed below for reference.    Significant Diagnostic Studies: DG Chest 2 View  Result Date: 04/26/2020 CLINICAL DATA:  49 year old female with shortness of breath. Concern for sepsis. EXAM: CHEST - 2 VIEW COMPARISON:  Chest radiograph dated 04/09/2020. FINDINGS: Right-sided Port-A-Cath in similar position. There is no focal consolidation, pleural effusion, or pneumothorax. The cardiac silhouette is within limits. Atherosclerotic calcification of the aorta. No acute osseous pathology. IMPRESSION: No active cardiopulmonary disease. Electronically Signed   By: Anner Crete M.D.   On: 04/26/2020 19:06   DG Chest 2 View  Result Date: 04/03/2020  CLINICAL DATA:  Hemoptysis, multiple malignancies EXAM: CHEST - 2 VIEW COMPARISON:  04/01/2020 FINDINGS: The heart size and mediastinal contours are within normal limits. Right chest port catheter. Both lungs are clear. The visualized skeletal structures are unremarkable. IMPRESSION: No acute abnormality of the lungs. Consider CT to further evaluate otherwise unexplained hemoptysis. Electronically Signed   By: Eddie Candle M.D.   On: 04/03/2020 13:47   DG Chest 2 View  Result Date: 04/01/2020 CLINICAL DATA:  Shortness of breath EXAM: CHEST - 2 VIEW COMPARISON:  02/10/2020 FINDINGS: Right Port-A-Cath remains in place, unchanged. Heart is  normal size. No confluent airspace opacities or effusions. No acute bony abnormality. IMPRESSION: No active cardiopulmonary disease. Electronically Signed   By: Rolm Baptise M.D.   On: 04/01/2020 16:23   CT Chest High Resolution  Result Date: 04/04/2020 CLINICAL DATA:  Hemoptysis, shortness of breath, evaluate for acute interstitial pneumonitis EXAM: CT CHEST WITHOUT CONTRAST TECHNIQUE: Multidetector CT imaging of the chest was performed following the standard protocol without intravenous contrast. High resolution imaging of the lungs, as well as inspiratory and expiratory imaging, was performed. COMPARISON:  02/12/2020 FINDINGS: Cardiovascular: Right chest port catheter. Aortic atherosclerosis. Normal heart size. No pericardial effusion. Mediastinum/Nodes: No enlarged mediastinal, hilar, or axillary lymph nodes. Trachea and esophagus demonstrate no significant findings. Lungs/Pleura: There is mild partial atelectasis throughout the lungs, the primary axial sequence is performed in a very expiratory phase. There is mild lobular air trapping. Small, incidental benign extra extrapulmonary fat lobulation of the right hemidiaphragm (series 7, image 48). No pleural effusion or pneumothorax. Upper Abdomen: No acute abnormality. Musculoskeletal: No chest wall mass or suspicious bone lesions identified. Status post bilateral mastectomy and implant reconstruction. Right axillary lymph node dissection. IMPRESSION: 1. No acute CT findings to explain hemoptysis. 2. There is mild lobular air trapping, suggestive of small airways disease. 3. Aortic Atherosclerosis (ICD10-I70.0). Electronically Signed   By: Eddie Candle M.D.   On: 04/04/2020 17:08   IR REMOVAL TUN ACCESS W/ PORT W/O FL MOD SED  Result Date: 04/27/2020 CLINICAL DATA:  Persistent bacteremia and need to remove indwelling right chest Port-A-Cath. Request has also been made to place additional central venous access due to poor intravenous access history. EXAM:  1. PICC LINE PLACEMENT IN LEFT ARM WITH ULTRASOUND AND FLUOROSCOPIC GUIDANCE 2. REMOVAL OF IMPLANTED TUNNELED PORT-A-CATH MEDICATIONS: 2 g IV Ancef. The antibiotic was administered within 1 hour prior to the start of the procedure. Moderate (conscious) sedation was employed during this procedure. A total of Versed 4.0 mg and Fentanyl 100 mcg was administered intravenously. Moderate Sedation Time: 35 minutes. The patient's level of consciousness and vital signs were monitored continuously by radiology nursing throughout the procedure under my direct supervision. FLUOROSCOPY TIME:  6 seconds.  1.0 mGy. PROCEDURE: Prior to the procedure informed consent was obtained. A time-out was performed prior to initiating the procedure. Ultrasound was used to confirm patency of the left brachial vein. The left upper arm was prepped with chlorhexidine and draped. Under direct ultrasound guidance the left brachial vein was punctured with a 21 gauge needle and ultrasound image documentation performed. A guidewire was advanced through the needle and the needle removed. A 5 French peel-away sheath was placed at the venous access site. The guidewire was used to measure an appropriate length of catheter. A 5 French dual-lumen power injectable PICC line was cut to 40 cm and placed through the sheath. The sheath was removed. Final PICC line position was confirmed with a fluoroscopic  spot image. The catheter was secured to the skin and a dressing applied. The right chest Port-A-Cath site was prepped with chlorhexidine. A sterile gown and gloves were worn during the procedure. Local anesthesia was provided with 1% lidocaine. An incision was made overlying the Port-A-Cath with a #15 scalpel. Utilizing sharp and blunt dissection, the Port-A-Cath was removed. Portable cautery was utilized. Retention sutures were removed. The pocked was irrigated with sterile saline. Wound closure was performed with subcutaneous 3-0 Monocryl, subcuticular 4-0  Vicryl and Dermabond. The entire Port-A-Cath was removed successfully. IMPRESSION: 1. Placement of left upper extremity power injectable PICC line via left brachial vein. The catheter tip lies at the SVC/RA junction. The catheter is ready for immediate use. 2. Removal of implanted Port-A-Cath utilizing sharp and blunt dissection. The procedure was uncomplicated. Electronically Signed   By: Aletta Edouard M.D.   On: 04/27/2020 17:04   DG CHEST PORT 1 VIEW  Result Date: 04/09/2020 CLINICAL DATA:  Mass in chest. EXAM: PORTABLE CHEST 1 VIEW COMPARISON:  Chest x-ray dated 04/03/2020. chest CT dated 04/04/2020. FINDINGS: Heart size and mediastinal contours are within normal limits. RIGHT chest wall Port-A-Cath is stable in position with tip overlying the RIGHT atrium. Lungs are clear. No pleural effusion or pneumothorax is seen. Osseous structures about the chest are unremarkable. IMPRESSION: 1. No active disease. No evidence of pneumonia or pulmonary edema. 2. RIGHT chest wall Port-A-Cath is stable in position. Electronically Signed   By: Franki Cabot M.D.   On: 04/09/2020 07:01   ECHOCARDIOGRAM COMPLETE  Result Date: 04/28/2020    ECHOCARDIOGRAM REPORT   Patient Name:   MCKINZIE SAKSA Date of Exam: 04/28/2020 Medical Rec #:  [id number]      Height:       65.0 in Accession #:    [id number]     Weight:       196.0 lb Date of Birth:  [birth year]      BSA:          1.961 m Patient Age:    49 years       BP:           105/73 mmHg Patient Gender: F              HR:           82 bpm. Exam Location:  Inpatient Procedure: 2D Echo and Intracardiac Opacification Agent Indications:    Bacteremia 790.7 / R78.81  History:        Patient has prior history of Echocardiogram examinations, most                 recent 02/13/2020. CHF, Signs/Symptoms:Dyspnea; Risk                 Factors:Hypertension. Tachycardia, Thyroid cancer, Breast                 cancer, Raynaud phenomenon, GERD, Non-Ischemic cardiomyopathy,                  Chronic respiratory failure.  Sonographer:    Darlina Sicilian RDCS Referring Phys: [phone] Aariz Maish  Sonographer Comments: Image acquisition challenging due to breast implants. IMPRESSIONS  1. Left ventricular ejection fraction, by estimation, is 60 to 65%. The left ventricle has normal function. The left ventricle has no regional wall motion abnormalities. There is mild left ventricular hypertrophy. Left ventricular diastolic parameters were normal.  2. Right ventricule is not well-visualized, but grossly normal size and systolic function. There is  normal pulmonary artery systolic pressure.  3. The mitral valve is normal in structure. Trivial mitral valve regurgitation.  4. Tricuspid valve regurgitation is mild to moderate.  5. The aortic valve was not well visualized. Moderately calcified. Aortic valve regurgitation is mild. Mild to moderate aortic valve sclerosis/calcification is present, without any evidence of aortic stenosis. Conclusion(s)/Recommendation(s): Technically difficult study, but no clear vegetation seen. If clinical suspicion for endocarditis, consider TEE. FINDINGS  Left Ventricle: Left ventricular ejection fraction, by estimation, is 60 to 65%. The left ventricle has normal function. The left ventricle has no regional wall motion abnormalities. Definity contrast agent was given IV to delineate the left ventricular  endocardial borders. The left ventricular internal cavity size was normal in size. There is mild left ventricular hypertrophy. Left ventricular diastolic parameters were normal. Right Ventricle: The right ventricular size is not well visualized. Right vetricular wall thickness was not assessed. Right ventricular systolic function is normal. There is normal pulmonary artery systolic pressure. The tricuspid regurgitant velocity is  2.26 m/s, and with an assumed right atrial pressure of 8 mmHg, the estimated right ventricular systolic pressure is 53.7 mmHg. Left Atrium: Left atrial size was  not well visualized. Right Atrium: Right atrial size was not well visualized. Pericardium: There is no evidence of pericardial effusion. Mitral Valve: The mitral valve is normal in structure. Trivial mitral valve regurgitation. Tricuspid Valve: The tricuspid valve is normal in structure. Tricuspid valve regurgitation is mild to moderate. Aortic Valve: The aortic valve was not well visualized. Aortic valve regurgitation is mild. Mild to moderate aortic valve sclerosis/calcification is present, without any evidence of aortic stenosis. There is moderate calcification of the aortic valve. Pulmonic Valve: The pulmonic valve was not well visualized. Pulmonic valve regurgitation is not visualized. Aorta: The aortic root is normal in size and structure. IAS/Shunts: The interatrial septum was not well visualized.  LEFT VENTRICLE PLAX 2D LVIDd:         3.80 cm  Diastology LVIDs:         2.80 cm  LV e' lateral:   11.20 cm/s LV PW:         1.00 cm  LV E/e' lateral: 12.8 LV IVS:        1.00 cm  LV e' medial:    7.18 cm/s LVOT diam:     1.50 cm  LV E/e' medial:  19.9 LV SV:         35 LV SV Index:   18 LVOT Area:     1.77 cm  RIGHT VENTRICLE TAPSE (M-mode): 1.9 cm AORTIC VALVE LVOT Vmax:   104.00 cm/s LVOT Vmean:  78.300 cm/s LVOT VTI:    0.199 m  AORTA Ao Root diam: 2.20 cm MITRAL VALVE                TRICUSPID VALVE MV Area (PHT): 3.83 cm     TR Peak grad:   20.4 mmHg MV Decel Time: 198 msec     TR Vmax:        226.00 cm/s MV E velocity: 143.00 cm/s MV A velocity: 141.00 cm/s  SHUNTS MV E/A ratio:  1.01         Systemic VTI:  0.20 m                             Systemic Diam: 1.50 cm Oswaldo Milian MD Electronically signed by Oswaldo Milian MD Signature Date/Time: 04/28/2020/8:07:18 PM  Final    [place] CHEST SOFT TISSUE  Result Date: 04/10/2020 CLINICAL DATA:  Right anterior chest wall mass. EXAM: CHEST ULTRASOUND COMPARISON:  Chest CT 04/04/2020. FINDINGS: No solid mass, cyst, hematoma or abscess is identified.  Fairly homogeneous appearing subcutaneous fat. Recommend clinical surveillance. MR imaging may be helpful for further evaluation if indicated. IMPRESSION: Unremarkable ultrasound examination of the right lower chest wall. Electronically Signed   By: Marijo Sanes M.D.   On: 04/10/2020 08:44   UE VENOUS DUPLEX ([facility] & [facility] 7 am - 7 pm)  Result Date: 04/01/2020 UPPER VENOUS STUDY  Indications: Swelling Limitations: Body habitus and poor ultrasound/tissue interface. Comparison Study: No prior studies. Performing Technologist: Oliver Hum RVT  Examination Guidelines: A complete evaluation includes B-mode imaging, spectral Doppler, color Doppler, and power Doppler as needed of all accessible portions of each vessel. Bilateral testing is considered an integral part of a complete examination. Limited examinations for reoccurring indications may be performed as noted.  Right Findings: +----------+------------+---------+-----------+----------+-------+ RIGHT     CompressiblePhasicitySpontaneousPropertiesSummary +----------+------------+---------+-----------+----------+-------+ Subclavian    Full       Yes       Yes                      +----------+------------+---------+-----------+----------+-------+  Left Findings: +----------+------------+---------+-----------+----------+-------+ LEFT      CompressiblePhasicitySpontaneousPropertiesSummary +----------+------------+---------+-----------+----------+-------+ IJV           Full       Yes       No                       +----------+------------+---------+-----------+----------+-------+ Subclavian    Full       Yes       No                       +----------+------------+---------+-----------+----------+-------+ Axillary      Full       Yes       No                       +----------+------------+---------+-----------+----------+-------+ Brachial      Full       Yes       No                        +----------+------------+---------+-----------+----------+-------+ Radial        Full                                          +----------+------------+---------+-----------+----------+-------+ Ulnar         Full                                          +----------+------------+---------+-----------+----------+-------+ Cephalic      Full                                          +----------+------------+---------+-----------+----------+-------+ Basilic       Full                                          +----------+------------+---------+-----------+----------+-------+  Summary:  Right: No evidence of thrombosis in the subclavian.  Left: No evidence of deep vein thrombosis in the upper extremity. No evidence of superficial vein thrombosis in the upper extremity.  *See table(s) above for measurements and observations.  Diagnosing physician: Monica Martinez MD Electronically signed by Monica Martinez MD on 04/01/2020 at 6:33:43 PM.    Final    IRPICC PLACEMENT LEFT <5 [place] IMG GUIDE  Result Date: 04/27/2020 CLINICAL DATA:  Persistent bacteremia and need to remove indwelling right chest Port-A-Cath. Request has also been made to place additional central venous access due to poor intravenous access history. EXAM: 1. PICC LINE PLACEMENT IN LEFT ARM WITH ULTRASOUND AND FLUOROSCOPIC GUIDANCE 2. REMOVAL OF IMPLANTED TUNNELED PORT-A-CATH MEDICATIONS: 2 g IV Ancef. The antibiotic was administered within 1 hour prior to the start of the procedure. Moderate (conscious) sedation was employed during this procedure. A total of Versed 4.0 mg and Fentanyl 100 mcg was administered intravenously. Moderate Sedation Time: 35 minutes. The patient's level of consciousness and vital signs were monitored continuously by radiology nursing throughout the procedure under my direct supervision. FLUOROSCOPY TIME:  6 seconds.  1.0 mGy. PROCEDURE: Prior to the procedure informed consent was obtained. A time-out  was performed prior to initiating the procedure. Ultrasound was used to confirm patency of the left brachial vein. The left upper arm was prepped with chlorhexidine and draped. Under direct ultrasound guidance the left brachial vein was punctured with a 21 gauge needle and ultrasound image documentation performed. A guidewire was advanced through the needle and the needle removed. A 5 French peel-away sheath was placed at the venous access site. The guidewire was used to measure an appropriate length of catheter. A 5 French dual-lumen power injectable PICC line was cut to 40 cm and placed through the sheath. The sheath was removed. Final PICC line position was confirmed with a fluoroscopic spot image. The catheter was secured to the skin and a dressing applied. The right chest Port-A-Cath site was prepped with chlorhexidine. A sterile gown and gloves were worn during the procedure. Local anesthesia was provided with 1% lidocaine. An incision was made overlying the Port-A-Cath with a #15 scalpel. Utilizing sharp and blunt dissection, the Port-A-Cath was removed. Portable cautery was utilized. Retention sutures were removed. The pocked was irrigated with sterile saline. Wound closure was performed with subcutaneous 3-0 Monocryl, subcuticular 4-0 Vicryl and Dermabond. The entire Port-A-Cath was removed successfully. IMPRESSION: 1. Placement of left upper extremity power injectable PICC line via left brachial vein. The catheter tip lies at the SVC/RA junction. The catheter is ready for immediate use. 2. Removal of implanted Port-A-Cath utilizing sharp and blunt dissection. The procedure was uncomplicated. Electronically Signed   By: Aletta Edouard M.D.   On: 04/27/2020 17:04    Microbiology: Recent Results (from the past 240 hour(s))  Culture, blood (Routine x 2)     Status: None (Preliminary result)   Collection Time: 04/26/20  4:59 PM   Specimen: BLOOD  Result Value Ref Range Status   Specimen Description    Final    BLOOD PORTA CATH Performed at [facility] [address], [place], [place] [address]    Special Requests   Final    BOTTLES DRAWN AEROBIC AND ANAEROBIC Blood Culture adequate volume Performed at [facility] [address] [place], [place] [address]    Culture   Final    NO GROWTH 3 DAYS Performed at [facility], [place]  [address] [place], [place] [address]    Report Status PENDING  Incomplete  SARS Coronavirus 2 by RT PCR (hospital order, performed in [facility] hospital lab) Nasopharyngeal Nasopharyngeal Swab     Status: None   Collection Time: 04/26/20 11:30 PM   Specimen: Nasopharyngeal Swab  Result Value Ref Range Status   SARS Coronavirus 2 NEGATIVE NEGATIVE Final    Comment: (NOTE) SARS-CoV-2 target nucleic acids are NOT DETECTED.  The SARS-CoV-2 RNA is generally detectable in upper and lower respiratory specimens during the acute phase of infection. The lowest concentration of SARS-CoV-2 viral copies this assay can detect is 250 copies / mL. A negative result does not preclude SARS-CoV-2 infection and should not be used as the sole basis for treatment or other patient management decisions.  A negative result may occur with improper specimen collection / handling, submission of specimen other than nasopharyngeal swab, presence of viral mutation(s) within the areas targeted by this assay, and inadequate number of viral copies (<250 copies / mL). A negative result must be combined with clinical observations, patient history, and epidemiological information.  Fact Sheet for Patients:   StrictlyIdeas.no  Fact Sheet for Healthcare Providers: BankingDealers.co.za  This test is not yet approved or  cleared by the [place] FDA and has been authorized for detection and/or diagnosis of SARS-CoV-2 by FDA under an Emergency Use Authorization (EUA).  This EUA will remain in  effect (meaning this test can be used) for the duration of the COVID-19 declaration under Section 564(b)(1) of the Act, 21 U.S.C. section 360bbb-3(b)(1), unless the authorization is terminated or revoked sooner.  Performed at [facility], [place] [address] [place],  [address]      Labs: Basic Metabolic Panel: Recent Labs  Lab 04/26/20 1659 04/27/20 0613  NA 142 140  K 3.7 3.8  CL 101 101  CO2 30 27  GLUCOSE 111* 112*  BUN 24* 24*  CREATININE 1.31* 1.13*  CALCIUM 10.0 9.4   Liver Function Tests: Recent Labs  Lab 04/26/20 1659  AST 29  ALT 39  ALKPHOS 64  BILITOT 0.5  PROT 7.9  ALBUMIN 4.2   No results for input(s): LIPASE, AMYLASE in the last 168 hours. No results for input(s): AMMONIA in the last 168 hours. CBC: Recent Labs  Lab 04/26/20 1659  WBC 8.7  NEUTROABS 5.2  HGB 12.2  HCT 38.9  MCV 92.4  PLT 347   Cardiac Enzymes: No results for input(s): CKTOTAL, CKMB, CKMBINDEX, TROPONINI in the last 168 hours. BNP: BNP (last 3 results) Recent Labs    02/11/20 0530 04/01/20 1517  BNP 90.6 29.6    ProBNP (last 3 results) No results for input(s): PROBNP in the last 8760 hours.  CBG: No results for input(s): GLUCAP in the last 168 hours.  Principal Problem:   Fever Active Problems:   Hx of Hodgkins lymphoma   History of ductal carcinoma in situ (DCIS) of breast   History of thyroid cancer   Adrenal insufficiency (HCC)   Hypertension   Raynaud phenomenon   Hyperlipidemia   History of pituitary adenoma   History of cervical cancer   Chronic respiratory failure with hypoxia (HCC)   Superficial thrombophlebitis   Moderate persistent asthma without complication   Port-A-Cath in place   Addison's disease ([place])   Bacteremia   AKI (acute kidney injury) ([place])   Vasculopathy   Time coordinating discharge: 38 minutes  Signed:        Nairi Oswald, DO [facility]  04/29/2020, 7:35 PM

## 2020-05-01 LAB — CULTURE, BLOOD (ROUTINE X 2)
Culture: NO GROWTH
Special Requests: ADEQUATE

## 2020-05-03 ENCOUNTER — Encounter: Payer: Self-pay | Admitting: Internal Medicine

## 2020-05-03 ENCOUNTER — Ambulatory Visit (INDEPENDENT_AMBULATORY_CARE_PROVIDER_SITE_OTHER): Payer: BC Managed Care – PPO | Admitting: Internal Medicine

## 2020-05-03 ENCOUNTER — Telehealth: Payer: Self-pay

## 2020-05-03 ENCOUNTER — Other Ambulatory Visit: Payer: Self-pay

## 2020-05-03 VITALS — BP 118/84 | HR 109 | Temp 98.2°F

## 2020-05-03 DIAGNOSIS — I999 Unspecified disorder of circulatory system: Secondary | ICD-10-CM | POA: Diagnosis not present

## 2020-05-03 NOTE — Telephone Encounter (Signed)
Per MD called IR to schedule port replacement.  Left voicemail with Caryl Pina at IR to schedule for next available. Will call patient with appointment date/time. [place]

## 2020-05-03 NOTE — Progress Notes (Signed)
RFV: follow up for sphingomonas paucimobilis bacteremia Patient ID: [name], female   DOB: 03-12-1971, 49 y.o.   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 Her work up was significant with finding 1 bottle of sphingomonas paucimobilis. She was initially on ceftiraxone but still having fevers, then was changed to meropenem which she is improving.  She completed meropenem and discharged from hospital on 7/30. Looks good presently. She is concerned to be without vascular access when we pull picc line and then transition back to portacath Outpatient Encounter Medications as of 05/03/2020  Medication Sig   albuterol (2.5 MG/3ML) 0.083% NEBU 3 mL, albuterol (5 MG/ML) 0.5% NEBU 0.5 mL Inhale 3 mg into the lungs every 6 (six) hours as needed (SOB).    albuterol (VENTOLIN HFA) 108 (90 Base) MCG/ACT inhaler Inhale 2 puffs into the lungs every 6 (six) hours as needed for wheezing or shortness of breath.   Alum & Mag Hydroxide-Simeth (GI COCKTAIL) SUSP suspension Take 30 mLs daily as needed by mouth for indigestion (BLEEDING ULCERS). Shake well.   aspirin 325 MG tablet Take 325 mg by mouth at bedtime.    beclomethasone (QVAR) 80 MCG/ACT inhaler Inhale 1 puff into the lungs 2 (two) times daily.    benzonatate (TESSALON) 200 MG capsule Take 1 capsule (200 mg total) by mouth 3 (three) times daily as needed for cough.   clonazePAM (KLONOPIN) 1 MG tablet Take 1 mg by mouth 2 (two) times daily.    colchicine 0.6 MG tablet Take 0.6 mg by mouth daily in the afternoon.    dexlansoprazole (DEXILANT) 60 MG capsule Take 60 mg by mouth daily.      diphenhydrAMINE (BENADRYL) 50 MG/ML injection Inject 1 mL (50 mg total) into the vein every 6 (six) hours as needed (nausea).   dronabinol (MARINOL) 2.5 MG capsule Take 1 capsule (2.5 mg total) by mouth 2 (two) times daily as needed (nausea).   EPINEPHRINE 0.3 mg/0.3 mL IJ SOAJ injection INJECT 0.3 MLS (0.3 MG TOTAL) INTO THE MUSCLE AS NEEDED FOR ANAPHYLAXIS. (Patient taking differently: Inject 0.3 mg into the muscle daily as needed for anaphylaxis. )   escitalopram (LEXAPRO) 20 MG tablet Take 20 mg by mouth every evening.    fluconazole (DIFLUCAN) 100 MG tablet Take 1 tablet (100 mg total) by mouth daily for 10 days.   fluticasone furoate-vilanterol (BREO ELLIPTA) 200-25 MCG/INH AEPB Inhale 1 puff into the lungs daily.   furosemide (LASIX) 40 MG tablet Take 1 tablet (40 mg total) by mouth in the morning and at bedtime.   Galcanezumab-gnlm 120 MG/ML SOAJ Inject 120 mg into the skin every 28 (twenty-eight) days.    guaiFENesin (MUCINEX) 600 MG 12 hr tablet Take 2 tablets (1,200 mg total) by mouth 2 (two) times daily for 10 days.   hydrocortisone (CORTEF) 10 MG tablet Take 1-2 tablets (10-20 mg total) by mouth See admin instructions. Take 20 mg in the am and 30m in the evening (Patient taking differently: Take 10-20 mg by mouth See admin instructions. Take 20 mg by mouth in the morning and 164min the evening)   levothyroxine (SYNTHROID, LEVOTHROID) 112 MCG tablet Take 112 mcg by mouth  daily before breakfast.   LORazepam (ATIVAN) 0.5 MG tablet TAKE 1-2 TABLETS (0.5-1 MG TOTAL) BY MOUTH 2 (TWO) TIMES DAILY AS NEEDED (NAUSEA).   Mepolizumab (NUCALA) 100 MG SOLR Inject 100 mg into the skin every 28 (twenty-eight) days.   metoprolol succinate (TOPROL-XL) 25 MG 24 hr tablet Take 37.5 mg by mouth daily.    OXYGEN Inhale 4 L into the lungs continuous.    Rimegepant Sulfate (NURTEC) 75 MG TBDP Take 75 mg by mouth daily as needed (Migraine).    rosuvastatin (CRESTOR) 10 MG tablet Take 10 mg by  mouth daily.    silver sulfADIAZINE (SILVADENE) 1 % cream Apply 1 application topically daily as needed (Open Wound).    sodium chloride 0.9 % infusion Use as directed.   sodium chloride flush 0.9 % SOLN injection 10 mL syringes - #30 count with 1 refill   sucralfate (CARAFATE) 1 g tablet Take 1 g by mouth daily as needed (FOR ULCERS).    topiramate (TOPAMAX) 50 MG tablet Take 150 mg by mouth at bedtime.    zolpidem (AMBIEN CR) 12.5 MG CR tablet Take 12.5 mg by mouth at bedtime.     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[place])    Nausea 04/01/2020   Generalized weakness 02/10/2020   Headache 02/10/2020   Subcutaneous nodule 02/01/2020   Port-A-Cath in place 09/09/2019   Moderate persistent asthma without complication 16/07/9603   Fever 07/21/2019   Superficial thrombophlebitis 07/16/2019   Injury of left index finger 12/24/2018   History of pituitary adenoma 12/23/2018   History of cervical cancer 12/23/2018   Chronic respiratory failure with hypoxia (HCC) 12/23/2018   SOB (shortness of breath)    Nephrolithiasis 11/07/2018   Oxygen dependent 11/07/2018   Migraine 11/07/2018   PVC's (premature ventricular contractions) 10/27/2018   Hyperlipidemia 03/09/2014   Hx of Hodgkins lymphoma    History of ductal carcinoma in situ (DCIS) of breast    History of thyroid cancer    Adrenal insufficiency (HCC)    Hypertension    Raynaud phenomenon    GERD (gastroesophageal reflux disease)      Health Maintenance Due  Topic Date Due   COVID-19 Vaccine (1) Never done   PAP SMEAR-Modifier  Never done   INFLUENZA VACCINE  05/01/2020     Review of Systems 12 point ros is negative, except that she has some swelling of  ankles and trace swelling to hands Physical Exam   BP 118/84    Pulse (!) 109    Temp 98.2 F (36.8 C) (Oral)    LMP  (LMP Unknown)   Physical Exam  Constitutional:  oriented to person, place, and time. appears well-developed and well-nourished. No distress.  HENT: [place]/AT, PERRLA, no scleral icterus Mouth/Throat: Oropharynx is clear and moist. No oropharyngeal exudate.  Ext: picc line in the left arm is c/d/i. Trace swelling of legs Psychiatric: a normal mood and affect.  behavior is normal.   CBC Lab Results  Component Value Date   WBC 8.7 04/26/2020   RBC 4.21 04/26/2020   HGB 12.2 04/26/2020   HCT 38.9 04/26/2020   PLT 347 04/26/2020   MCV 92.4 04/26/2020   MCH 29.0 04/26/2020   MCHC 31.4 04/26/2020   RDW 14.6 04/26/2020  LYMPHSABS 2.4 04/26/2020   MONOABS 0.8 04/26/2020   EOSABS 0.1 04/26/2020    BMET Lab Results  Component Value Date   NA 140 04/27/2020   K 3.8 04/27/2020   CL 101 04/27/2020   CO2 27 04/27/2020   GLUCOSE 112 (H) 04/27/2020   BUN 24 (H) 04/27/2020   CREATININE 1.13 (H) 04/27/2020   CALCIUM 9.4 04/27/2020   GFRNONAA 57 (L) 04/27/2020   GFRAA >60 04/27/2020      Assessment and Plan  Fevers have resolved.  Vascular access Needs port. Will keep her current picc line in, continue with daily heparin flush, Will change dressing today Arrange for ir to place port. Will pull picc line 72hr before she is due for port.

## 2020-05-03 NOTE — Telephone Encounter (Signed)
Verbal order per Dr Baxter Flattery for PICC maintenance relayed to [facility] at [facility], will need saline and PICC heparin flushes.  Landis Gandy, RN

## 2020-05-04 NOTE — Telephone Encounter (Signed)
Called IR to follow up on appointment for port replacement. Appointment is scheduled for this Friday at 9 am, will need to arrive at [facility] admissions by 7:30.  NPO after midnight and will need someone to drive her home after appointment.  Patient is requesting anesthesia per RN, states versed/fentanyl do not work. Relayed this to [place] who will have to follow up with provider if this is something they can do. Will call office back with information.  [place]

## 2020-05-05 ENCOUNTER — Other Ambulatory Visit: Payer: Self-pay | Admitting: Radiology

## 2020-05-05 DIAGNOSIS — R7881 Bacteremia: Secondary | ICD-10-CM | POA: Diagnosis not present

## 2020-05-05 NOTE — Telephone Encounter (Signed)
Spoke with patient, there is confusion about what she is asking, she would like to speak with IR.  RN relayed message to Caryl Pina in IR who will have a PA contact patient to clarify.

## 2020-05-05 NOTE — Telephone Encounter (Signed)
Following up. 1 - PICC supplies are being delivered today per Jackelyn Poling at [place] in IR called back.  Per Dr Jacqualyn Posey, patient has 2 options if she needs general anesthesia.  1, she can have this placed through [facility] [phone] for scheduling/order) or 2, can come in for consultation with IR clinic at [place], clinic appointment to be scheduled by Vick[facility].   Caryl Pina will contact [facility]ick[facility] to place [name] on the IR clinic schedule.  RN called [name], left message asking her to call [facility] for update. Landis Gandy, RN

## 2020-05-06 ENCOUNTER — Ambulatory Visit (HOSPITAL_COMMUNITY): Payer: BC Managed Care – PPO

## 2020-05-06 ENCOUNTER — Ambulatory Visit (HOSPITAL_COMMUNITY)
Admission: RE | Admit: 2020-05-06 | Discharge: 2020-05-06 | Disposition: A | Discharge status: Home / Self Care | Payer: BC Managed Care – PPO | Source: Ambulatory Visit | Attending: Internal Medicine | Admitting: Internal Medicine

## 2020-05-06 ENCOUNTER — Other Ambulatory Visit: Payer: Self-pay

## 2020-05-06 DIAGNOSIS — Z885 Allergy status to narcotic agent status: Secondary | ICD-10-CM | POA: Insufficient documentation

## 2020-05-06 DIAGNOSIS — Z9013 Acquired absence of bilateral breasts and nipples: Secondary | ICD-10-CM | POA: Insufficient documentation

## 2020-05-06 DIAGNOSIS — Z9981 Dependence on supplemental oxygen: Secondary | ICD-10-CM | POA: Insufficient documentation

## 2020-05-06 DIAGNOSIS — I428 Other cardiomyopathies: Secondary | ICD-10-CM | POA: Diagnosis not present

## 2020-05-06 DIAGNOSIS — K219 Gastro-esophageal reflux disease without esophagitis: Secondary | ICD-10-CM | POA: Diagnosis not present

## 2020-05-06 DIAGNOSIS — I110 Hypertensive heart disease with heart failure: Secondary | ICD-10-CM | POA: Insufficient documentation

## 2020-05-06 DIAGNOSIS — I999 Unspecified disorder of circulatory system: Secondary | ICD-10-CM | POA: Diagnosis not present

## 2020-05-06 DIAGNOSIS — E890 Postprocedural hypothyroidism: Secondary | ICD-10-CM | POA: Insufficient documentation

## 2020-05-06 DIAGNOSIS — Z79890 Hormone replacement therapy (postmenopausal): Secondary | ICD-10-CM | POA: Insufficient documentation

## 2020-05-06 DIAGNOSIS — M810 Age-related osteoporosis without current pathological fracture: Secondary | ICD-10-CM | POA: Diagnosis not present

## 2020-05-06 DIAGNOSIS — R7303 Prediabetes: Secondary | ICD-10-CM | POA: Diagnosis not present

## 2020-05-06 DIAGNOSIS — Z7982 Long term (current) use of aspirin: Secondary | ICD-10-CM | POA: Insufficient documentation

## 2020-05-06 DIAGNOSIS — Z79899 Other long term (current) drug therapy: Secondary | ICD-10-CM | POA: Diagnosis not present

## 2020-05-06 DIAGNOSIS — Z888 Allergy status to other drugs, medicaments and biological substances status: Secondary | ICD-10-CM | POA: Diagnosis not present

## 2020-05-06 DIAGNOSIS — I50810 Right heart failure, unspecified: Secondary | ICD-10-CM | POA: Insufficient documentation

## 2020-05-06 DIAGNOSIS — Z452 Encounter for adjustment and management of vascular access device: Secondary | ICD-10-CM | POA: Diagnosis not present

## 2020-05-06 DIAGNOSIS — Z89022 Acquired absence of left finger(s): Secondary | ICD-10-CM | POA: Insufficient documentation

## 2020-05-06 HISTORY — PX: IR IMAGING GUIDED PORT INSERTION: IMG5740

## 2020-05-06 MED ORDER — HEPARIN SOD (PORK) LOCK FLUSH 100 UNIT/ML IV SOLN
INTRAVENOUS | Status: AC
  Filled 2020-05-06: qty 5

## 2020-05-06 MED ORDER — SODIUM CHLORIDE 0.9 % IV SOLN: INTRAVENOUS | Status: DC

## 2020-05-06 MED ORDER — FENTANYL CITRATE (PF) 100 MCG/2ML IJ SOLN
INTRAMUSCULAR | Status: AC
  Filled 2020-05-06: qty 4

## 2020-05-06 MED ORDER — CEFAZOLIN SODIUM-DEXTROSE 2-4 GM/100ML-% IV SOLN
INTRAVENOUS | Status: AC
  Administered 2020-05-06: 2 g via INTRAVENOUS
  Filled 2020-05-06: qty 100

## 2020-05-06 MED ORDER — MIDAZOLAM HCL 2 MG/2ML IJ SOLN
INTRAMUSCULAR | Status: AC | PRN
  Administered 2020-05-06 (×3): 1 mg via INTRAVENOUS

## 2020-05-06 MED ORDER — HEPARIN SOD (PORK) LOCK FLUSH 100 UNIT/ML IV SOLN
INTRAVENOUS | Status: AC | PRN
  Administered 2020-05-06: 500 [IU] via INTRAVENOUS

## 2020-05-06 MED ORDER — LIDOCAINE-EPINEPHRINE 1 %-1:100000 IJ SOLN
INTRAMUSCULAR | Status: AC
  Filled 2020-05-06: qty 1

## 2020-05-06 MED ORDER — MIDAZOLAM HCL 2 MG/2ML IJ SOLN
INTRAMUSCULAR | Status: AC
  Filled 2020-05-06: qty 6

## 2020-05-06 MED ORDER — FENTANYL CITRATE (PF) 100 MCG/2ML IJ SOLN
INTRAMUSCULAR | Status: AC | PRN
  Administered 2020-05-06 (×3): 50 ug via INTRAVENOUS

## 2020-05-06 MED ORDER — CEFAZOLIN SODIUM-DEXTROSE 2-4 GM/100ML-% IV SOLN: 2.0000 g | INTRAVENOUS | Status: AC

## 2020-05-06 MED ORDER — LIDOCAINE HCL 1 % IJ SOLN
INTRAMUSCULAR | Status: AC | PRN
  Administered 2020-05-06: 15 mL

## 2020-05-06 NOTE — Sedation Documentation (Signed)
Awaiting Short Stay nurse to call back

## 2020-05-06 NOTE — Procedures (Signed)
  Procedure: L IJ Port catheter placement   EBL:   minimal Complications:  none immediate  See full dictation in [facility].  Dillard Cannon MD Main # [phone] Pager  [phone]

## 2020-05-06 NOTE — H&P (Signed)
Chief Complaint: Patient was seen in consultation today for port-a-cath placement  Referring Physician(s): Carlyle Basques  Supervising Physician: Arne Cleveland  Patient Status: [facility] - Out-pt  History of Present Illness: [name] is a 49 y.o. female with a medical history that includes Addison's disease, pituitary tumor (s/p resection), multiple malignancies (breast, Hodgkin's, thyroid and cervical - all in remission), pulmonary hypertension, right heart failure and chronic respiratory failure (3-4L home O2), and vasculitis with recurrent ulcers and thromboses. She has a recent history of sphingomonas paucimobilis bacteremia and she is being followed by Infectious Disease. She has poor venous access and has had a port-a-cath in the past. This was removed due to the bacterial infection on 04/27/20 and a PICC was placed.   Interventional Radiology has been asked to evaluate this patient for the placement of a new port-a-cath.  Past Medical History:  Diagnosis Date  . Addison's disease ([place])   . Adrenal insufficiency ([place])   . Anemia   . Anxiety   . Aortic stenosis   . Aortic stenosis   . Appendicitis 12/19/09  . Appendicitis   . Breast cancer ([place])    STATUS POST BILATERAL MASTECTOMY. STATUS POST RECONSTRUCTION. SHE HAD SILICONE BREAST IMPLANTS AND THE LEFT IMPLANT IS LEAKING SLIGHTLY  . Cellulitis of right middle finger 11/07/2018  . Cervical cancer ([place]) 12/23/2018  . Chest pain   . CHF with right heart failure ([place]) 04/17/2017  . Chronic respiratory failure with hypoxia ([place]) 12/23/2018  . Cough variant asthma 04/13/2019  . Depression   . GERD (gastroesophageal reflux disease)   . Headache    migraines  . Heart murmur   . History of kidney stones   . Hodgkin lymphoma (HCC)    STATUS POST MANTLE RADIATION  . Hodgkin's lymphoma ([place])    1987  . Hypertension   . Hypoxia   . Necrotizing fasciitis ([place]) 12/23/2018  . Non-ischemic cardiomyopathy ([place])   . Osteoporosis     . Palpitations   . Pituitary adenoma ([place]) 12/23/2018  . Pneumonia   . PONV (postoperative nausea and vomiting)   . Pre-diabetes    per pt; no meds  . Pulmonary hypertension ([place]) 12/23/2018  . Raynaud phenomenon   . Right heart failure ([place]) 04/17/2017  . Supplemental oxygen dependent    3 liters  . SVT (supraventricular tachycardia) ([place])   . Tachycardia   . Thyroid cancer ([place])    STATUS POST SURGICAL REMOVAL-CURRENT ON THYROID REPLACEMENT    Past Surgical History:  Procedure Laterality Date  . ABDOMINAL HYSTERECTOMY    . AMPUTATION Left 01/30/2019   Procedure: Left Index finger amputation with flap reconstruction and repair reconstruction;  Surgeon: Roseanne Kaufman, MD;  Location: [place];  Service: Orthopedics;  Laterality: Left;  . APPENDECTOMY    . CARDIAC CATHETERIZATION  05/18/09   NORMAL CATH  . I & D EXTREMITY Left 12/23/2018   Procedure: IRRIGATION AND DEBRIDEMENT HAND / INDEX FINGER;  Surgeon: Roseanne Kaufman, MD;  Location: [place];  Service: Orthopedics;  Laterality: Left;  . IR REMOVAL TUN ACCESS W/ PORT W/O FL MOD SED  04/27/2020  . MASTECTOMY    . PITUITARY SURGERY    . RIGHT/LEFT HEART CATH AND CORONARY ANGIOGRAPHY N/A 04/02/2018   Procedure: RIGHT/LEFT HEART CATH AND CORONARY ANGIOGRAPHY;  Surgeon: Burnell Blanks, MD;  Location: [place] [facility];  Service: Cardiovascular;  Laterality: N/A;  . TOTAL THYROIDECTOMY    . VIDEO BRONCHOSCOPY Bilateral 11/14/2018   Procedure: VIDEO BRONCHOSCOPY  WITHOUT FLUORO;  Surgeon: Margaretha Seeds, MD;  Location: [place];  Service: Cardiopulmonary;  Laterality: Bilateral;  . VIDEO BRONCHOSCOPY WITH ENDOBRONCHIAL ULTRASOUND N/A 11/19/2018   Procedure: VIDEO BRONCHOSCOPY WITH ENDOBRONCHIAL ULTRASOUND;  Surgeon: Margaretha Seeds, MD;  Location: [place];  Service: Thoracic;  Laterality: N/A;    Allergies: Mushroom extract complex, Na ferric gluc cplx in sucrose, Cymbalta [duloxetine hcl], Succinylcholine, Buprenorphine hcl,  Compazine, Metoclopramide, Morphine and related, Ondansetron, Promethazine hcl, and Tegaderm ag mesh [silver]  Medications: Prior to Admission medications   Medication Sig Start Date End Date Taking? Authorizing Provider  albuterol (2.5 MG/3ML) 0.083% NEBU 3 mL, albuterol (5 MG/ML) 0.5% NEBU 0.5 mL Inhale 3 mg into the lungs every 6 (six) hours as needed (SOB).     [provider]  albuterol (VENTOLIN HFA) 108 (90 Base) MCG/ACT inhaler Inhale 2 puffs into the lungs every 6 (six) hours as needed for wheezing or shortness of breath.    [provider]  Alum & Mag Hydroxide-Simeth (GI COCKTAIL) SUSP suspension Take 30 mLs daily as needed by mouth for indigestion (BLEEDING ULCERS). Shake well.    [provider]  aspirin 325 MG tablet Take 325 mg by mouth at bedtime.     [provider]  beclomethasone (QVAR) 80 MCG/ACT inhaler Inhale 1 puff into the lungs 2 (two) times daily.     [provider]  benzonatate (TESSALON) 200 MG capsule Take 1 capsule (200 mg total) by mouth 3 (three) times daily as needed for cough. 04/15/19   Martyn Ehrich, NP  clonazePAM (KLONOPIN) 1 MG tablet Take 1 mg by mouth 2 (two) times daily.     [provider]  colchicine 0.6 MG tablet Take 0.6 mg by mouth daily in the afternoon.  12/31/19   [provider]  dexlansoprazole (DEXILANT) 60 MG capsule Take 60 mg by mouth daily.      [provider]  diphenhydrAMINE (BENADRYL) 50 MG/ML injection Inject 1 mL (50 mg total) into the vein every 6 (six) hours as needed (nausea). 12/25/18   Rai, Vernelle Emerald, MD  dronabinol (MARINOL) 2.5 MG capsule Take 1 capsule (2.5 mg total) by mouth 2 (two) times daily as needed (nausea). 04/10/20   Gherghe, Vella Redhead, MD  EPINEPHRINE 0.3 mg/0.3 mL IJ SOAJ injection INJECT 0.3 MLS (0.3 MG TOTAL) INTO THE MUSCLE AS NEEDED FOR ANAPHYLAXIS. Patient taking differently: Inject 0.3 mg into the muscle daily as needed for anaphylaxis.   12/09/19   Margaretha Seeds, MD  escitalopram (LEXAPRO) 20 MG tablet Take 20 mg by mouth every evening.  04/24/16   [provider]  fluconazole (DIFLUCAN) 100 MG tablet Take 1 tablet (100 mg total) by mouth daily for 10 days. 04/30/20 05/10/20  Swayze, Ava, DO  fluticasone furoate-vilanterol (BREO ELLIPTA) 200-25 MCG/INH AEPB Inhale 1 puff into the lungs daily.    [provider]  furosemide (LASIX) 40 MG tablet Take 1 tablet (40 mg total) by mouth in the morning and at bedtime. 04/10/20   Gherghe, Vella Redhead, MD  Galcanezumab-gnlm 120 MG/ML SOAJ Inject 120 mg into the skin every 28 (twenty-eight) days.  03/31/19   [provider]  guaiFENesin (MUCINEX) 600 MG 12 hr tablet Take 2 tablets (1,200 mg total) by mouth 2 (two) times daily for 10 days. 04/29/20 05/09/20  Swayze, Ava, DO  hydrocortisone (CORTEF) 10 MG tablet Take 1-2 tablets (10-20 mg total) by mouth See admin instructions. Take 20 mg in the am and  45m in the evening Patient taking differently: Take 10-20 mg by mouth See admin instructions. Take 20 mg by mouth in the morning and 176min the evening 02/17/20   KyCherylann Ratel, DO  levothyroxine (SYNTHROID, LEVOTHROID) 112 MCG tablet Take 112 mcg by mouth daily before breakfast.    [provider]  LORazepam (ATIVAN) 0.5 MG tablet TAKE 1-2 TABLETS (0.5-1 MG TOTAL) BY MOUTH 2 (TWO) TIMES DAILY AS NEEDED (NAUSEA). 04/22/20   GuNicholas LoseMD  Mepolizumab (NUCALA) 100 MG SOLR Inject 100 mg into the skin every 28 (twenty-eight) days. 05/13/19   ElMargaretha SeedsMD  metoprolol succinate (TOPROL-XL) 25 MG 24 hr tablet Take 37.5 mg by mouth daily.     [provider]  OXYGEN Inhale 4 L into the lungs continuous.     [provider]  Rimegepant Sulfate (NURTEC) 75 MG TBDP Take 75 mg by mouth daily as needed (Migraine).     [provider]  rosuvastatin (CRESTOR) 10 MG tablet Take 10 mg by mouth daily.  02/28/18   [provider]  silver  sulfADIAZINE (SILVADENE) 1 % cream Apply 1 application topically daily as needed (Open Wound).  11/16/19   [provider]  sodium chloride 0.9 % infusion Use as directed. 04/19/20   GuNicholas LoseMD  sodium chloride flush 0.9 % SOLN injection 10 mL syringes - #30 count with 1 refill 04/19/20   Tanner, VaLyndon Code PA-C  sucralfate (CARAFATE) 1 g tablet Take 1 g by mouth daily as needed (FOR ULCERS).     [provider]  topiramate (TOPAMAX) 50 MG tablet Take 150 mg by mouth at bedtime.  12/25/19   [provider]  zolpidem (AMBIEN CR) 12.5 MG CR tablet Take 12.5 mg by mouth at bedtime.      [provider]     Family History  Family history unknown: Yes    Social History   Socioeconomic History  . Marital status: Married    Spouse name: Not on file  . Number of children: Not on file  . Years of education: Not on file  . Highest education level: Not on file  Occupational History  . Not on file  Tobacco Use  . Smoking status: Never Smoker  . Smokeless tobacco: Never Used  Vaping Use  . Vaping Use: Never used  Substance and Sexual Activity  . Alcohol use: Yes    Comment: social   . Drug use: No  . Sexual activity: Yes    Birth control/protection: Surgical  Other Topics Concern  . Not on file  Social History Narrative  . Not on file   Social Determinants of Health   Financial Resource Strain:   . Difficulty of Paying Living Expenses:   Food Insecurity:   . Worried About RuCharity fundraisern the Last Year:   . RaArboriculturistn the Last Year:   Transportation Needs:   . LaFilm/video editorMedical):   . Marland Kitchenack of Transportation (Non-Medical):   Physical Activity:   . Days of Exercise per Week:   . Minutes of Exercise per Session:   Stress:   . Feeling of Stress :   Social Connections:   . Frequency of Communication with Friends and Family:   . Frequency of Social Gatherings with Friends and Family:   . Attends Religious Services:     . Active Member of Clubs or Organizations:   . Attends ClArchivisteetings:   .  Marital Status:     Review of Systems: A 12 point ROS discussed and pertinent positives are indicated in the HPI above.  All other systems are negative.  Review of Systems  Constitutional: Positive for fatigue.  Respiratory: Positive for shortness of breath. Negative for cough.   Cardiovascular: Negative for chest pain and leg swelling.  Gastrointestinal: Positive for constipation. Negative for abdominal pain, nausea and vomiting.  Musculoskeletal: Negative for back pain.  Neurological: Positive for headaches.    Vital Signs: BP (!) 140/94   Pulse (!) 104   Temp 98.3 F (36.8 C) (Oral)   Resp 14   Ht 5' 5" (1.651 m)   Wt 190 lb (86.2 kg)   LMP  (LMP Unknown)   SpO2 96%   BMI 31.62 kg/m   Physical Exam Constitutional:      General: She is not in acute distress. HENT:     Mouth/Throat:     Mouth: Mucous membranes are dry.     Pharynx: Oropharynx is clear.  Cardiovascular:     Rate and Rhythm: Normal rate and regular rhythm.     Heart sounds: Murmur heard.      Comments: Right upper chest incision from port-a-cath removal. Mild bruising to the area. Tender.  Pulmonary:     Effort: Pulmonary effort is normal.     Breath sounds: Normal breath sounds.     Comments: On oxygen via nasal cannula. Baseline.  Abdominal:     General: Bowel sounds are normal.     Palpations: Abdomen is soft.  Musculoskeletal:        General: Swelling and tenderness present.     Right lower leg: Edema present.     Left lower leg: Edema present.     Comments: Generalized swelling. First digit on left hand amputated secondary to vasculitis.  Bilateral mastectomy  Skin:    General: Skin is warm and dry.  Neurological:     Mental Status: She is alert and oriented to person, place, and time.     Imaging: DG Chest 2 View  Result Date: 04/26/2020 CLINICAL DATA:  49 year old female with shortness of  breath. Concern for sepsis. EXAM: CHEST - 2 VIEW COMPARISON:  Chest radiograph dated 04/09/2020. FINDINGS: Right-sided Port-A-Cath in similar position. There is no focal consolidation, pleural effusion, or pneumothorax. The cardiac silhouette is within limits. Atherosclerotic calcification of the aorta. No acute osseous pathology. IMPRESSION: No active cardiopulmonary disease. Electronically Signed   By: Anner Crete M.D.   On: 04/26/2020 19:06   IR REMOVAL TUN ACCESS W/ PORT W/O FL MOD SED  Result Date: 04/27/2020 CLINICAL DATA:  Persistent bacteremia and need to remove indwelling right chest Port-A-Cath. Request has also been made to place additional central venous access due to poor intravenous access history. EXAM: 1. PICC LINE PLACEMENT IN LEFT ARM WITH ULTRASOUND AND FLUOROSCOPIC GUIDANCE 2. REMOVAL OF IMPLANTED TUNNELED PORT-A-CATH MEDICATIONS: 2 g IV Ancef. The antibiotic was administered within 1 hour prior to the start of the procedure. Moderate (conscious) sedation was employed during this procedure. A total of Versed 4.0 mg and Fentanyl 100 mcg was administered intravenously. Moderate Sedation Time: 35 minutes. The patient's level of consciousness and vital signs were monitored continuously by radiology nursing throughout the procedure under my direct supervision. FLUOROSCOPY TIME:  6 seconds.  1.0 mGy. PROCEDURE: Prior to the procedure informed consent was obtained. A time-out was performed prior to initiating the procedure. Ultrasound was used to confirm patency of the left brachial vein.  The left upper arm was prepped with chlorhexidine and draped. Under direct ultrasound guidance the left brachial vein was punctured with a 21 gauge needle and ultrasound image documentation performed. A guidewire was advanced through the needle and the needle removed. A 5 French peel-away sheath was placed at the venous access site. The guidewire was used to measure an appropriate length of catheter. A 5  French dual-lumen power injectable PICC line was cut to 40 cm and placed through the sheath. The sheath was removed. Final PICC line position was confirmed with a fluoroscopic spot image. The catheter was secured to the skin and a dressing applied. The right chest Port-A-Cath site was prepped with chlorhexidine. A sterile gown and gloves were worn during the procedure. Local anesthesia was provided with 1% lidocaine. An incision was made overlying the Port-A-Cath with a #15 scalpel. Utilizing sharp and blunt dissection, the Port-A-Cath was removed. Portable cautery was utilized. Retention sutures were removed. The pocked was irrigated with sterile saline. Wound closure was performed with subcutaneous 3-0 Monocryl, subcuticular 4-0 Vicryl and Dermabond. The entire Port-A-Cath was removed successfully. IMPRESSION: 1. Placement of left upper extremity power injectable PICC line via left brachial vein. The catheter tip lies at the SVC/RA junction. The catheter is ready for immediate use. 2. Removal of implanted Port-A-Cath utilizing sharp and blunt dissection. The procedure was uncomplicated. Electronically Signed   By: Aletta Edouard M.D.   On: 04/27/2020 17:04   DG CHEST PORT 1 VIEW  Result Date: 04/09/2020 CLINICAL DATA:  Mass in chest. EXAM: PORTABLE CHEST 1 VIEW COMPARISON:  Chest x-ray dated 04/03/2020. chest CT dated 04/04/2020. FINDINGS: Heart size and mediastinal contours are within normal limits. RIGHT chest wall Port-A-Cath is stable in position with tip overlying the RIGHT atrium. Lungs are clear. No pleural effusion or pneumothorax is seen. Osseous structures about the chest are unremarkable. IMPRESSION: 1. No active disease. No evidence of pneumonia or pulmonary edema. 2. RIGHT chest wall Port-A-Cath is stable in position. Electronically Signed   By: Franki Cabot M.D.   On: 04/09/2020 07:01   ECHOCARDIOGRAM COMPLETE  Result Date: 04/28/2020    ECHOCARDIOGRAM REPORT   Patient Name:   TRIA NOGUERA Date of Exam: 04/28/2020 Medical Rec #:  [id number]      Height:       65.0 in Accession #:    [id number]     Weight:       196.0 lb Date of Birth:  1970/10/14      BSA:          1.961 m Patient Age:    73 years       BP:           105/73 mmHg Patient Gender: F              HR:           82 bpm. Exam Location:  Inpatient Procedure: 2D Echo and Intracardiac Opacification Agent Indications:    Bacteremia 790.7 / R78.81  History:        Patient has prior history of Echocardiogram examinations, most                 recent 02/13/2020. CHF, Signs/Symptoms:Dyspnea; Risk                 Factors:Hypertension. Tachycardia, Thyroid cancer, Breast                 cancer, Raynaud phenomenon, GERD, Non-Ischemic cardiomyopathy,  Chronic respiratory failure.  Sonographer:    Darlina Sicilian RDCS Referring Phys: [phone] AVA SWAYZE  Sonographer Comments: Image acquisition challenging due to breast implants. IMPRESSIONS  1. Left ventricular ejection fraction, by estimation, is 60 to 65%. The left ventricle has normal function. The left ventricle has no regional wall motion abnormalities. There is mild left ventricular hypertrophy. Left ventricular diastolic parameters were normal.  2. Right ventricule is not well-visualized, but grossly normal size and systolic function. There is normal pulmonary artery systolic pressure.  3. The mitral valve is normal in structure. Trivial mitral valve regurgitation.  4. Tricuspid valve regurgitation is mild to moderate.  5. The aortic valve was not well visualized. Moderately calcified. Aortic valve regurgitation is mild. Mild to moderate aortic valve sclerosis/calcification is present, without any evidence of aortic stenosis. Conclusion(s)/Recommendation(s): Technically difficult study, but no clear vegetation seen. If clinical suspicion for endocarditis, consider TEE. FINDINGS  Left Ventricle: Left ventricular ejection fraction, by estimation, is 60 to 65%. The left ventricle has  normal function. The left ventricle has no regional wall motion abnormalities. Definity contrast agent was given IV to delineate the left ventricular  endocardial borders. The left ventricular internal cavity size was normal in size. There is mild left ventricular hypertrophy. Left ventricular diastolic parameters were normal. Right Ventricle: The right ventricular size is not well visualized. Right vetricular wall thickness was not assessed. Right ventricular systolic function is normal. There is normal pulmonary artery systolic pressure. The tricuspid regurgitant velocity is  2.26 m/s, and with an assumed right atrial pressure of 8 mmHg, the estimated right ventricular systolic pressure is 62.6 mmHg. Left Atrium: Left atrial size was not well visualized. Right Atrium: Right atrial size was not well visualized. Pericardium: There is no evidence of pericardial effusion. Mitral Valve: The mitral valve is normal in structure. Trivial mitral valve regurgitation. Tricuspid Valve: The tricuspid valve is normal in structure. Tricuspid valve regurgitation is mild to moderate. Aortic Valve: The aortic valve was not well visualized. Aortic valve regurgitation is mild. Mild to moderate aortic valve sclerosis/calcification is present, without any evidence of aortic stenosis. There is moderate calcification of the aortic valve. Pulmonic Valve: The pulmonic valve was not well visualized. Pulmonic valve regurgitation is not visualized. Aorta: The aortic root is normal in size and structure. IAS/Shunts: The interatrial septum was not well visualized.  LEFT VENTRICLE PLAX 2D LVIDd:         3.80 cm  Diastology LVIDs:         2.80 cm  LV e' lateral:   11.20 cm/s LV PW:         1.00 cm  LV E/e' lateral: 12.8 LV IVS:        1.00 cm  LV e' medial:    7.18 cm/s LVOT diam:     1.50 cm  LV E/e' medial:  19.9 LV SV:         35 LV SV Index:   18 LVOT Area:     1.77 cm  RIGHT VENTRICLE TAPSE (M-mode): 1.9 cm AORTIC VALVE LVOT Vmax:   104.00  cm/s LVOT Vmean:  78.300 cm/s LVOT VTI:    0.199 m  AORTA Ao Root diam: 2.20 cm MITRAL VALVE                TRICUSPID VALVE MV Area (PHT): 3.83 cm     TR Peak grad:   20.4 mmHg MV Decel Time: 198 msec     TR Vmax:  226.00 cm/s MV E velocity: 143.00 cm/s MV A velocity: 141.00 cm/s  SHUNTS MV E/A ratio:  1.01         Systemic VTI:  0.20 m                             Systemic Diam: 1.50 cm Oswaldo Milian MD Electronically signed by Oswaldo Milian MD Signature Date/Time: 04/28/2020/8:07:18 PM    Final    [place] CHEST SOFT TISSUE  Result Date: 04/10/2020 CLINICAL DATA:  Right anterior chest wall mass. EXAM: CHEST ULTRASOUND COMPARISON:  Chest CT 04/04/2020. FINDINGS: No solid mass, cyst, hematoma or abscess is identified. Fairly homogeneous appearing subcutaneous fat. Recommend clinical surveillance. MR imaging may be helpful for further evaluation if indicated. IMPRESSION: Unremarkable ultrasound examination of the right lower chest wall. Electronically Signed   By: Marijo Sanes M.D.   On: 04/10/2020 08:44   [place] LEFT <5 [place] IMG GUIDE  Result Date: 04/27/2020 CLINICAL DATA:  Persistent bacteremia and need to remove indwelling right chest Port-A-Cath. Request has also been made to place additional central venous access due to poor intravenous access history. EXAM: 1. PICC LINE PLACEMENT IN LEFT ARM WITH ULTRASOUND AND FLUOROSCOPIC GUIDANCE 2. REMOVAL OF IMPLANTED TUNNELED PORT-A-CATH MEDICATIONS: 2 g IV Ancef. The antibiotic was administered within 1 hour prior to the start of the procedure. Moderate (conscious) sedation was employed during this procedure. A total of Versed 4.0 mg and Fentanyl 100 mcg was administered intravenously. Moderate Sedation Time: 35 minutes. The patient's level of consciousness and vital signs were monitored continuously by radiology nursing throughout the procedure under my direct supervision. FLUOROSCOPY TIME:  6 seconds.  1.0 mGy. PROCEDURE: Prior to  the procedure informed consent was obtained. A time-out was performed prior to initiating the procedure. Ultrasound was used to confirm patency of the left brachial vein. The left upper arm was prepped with chlorhexidine and draped. Under direct ultrasound guidance the left brachial vein was punctured with a 21 gauge needle and ultrasound image documentation performed. A guidewire was advanced through the needle and the needle removed. A 5 French peel-away sheath was placed at the venous access site. The guidewire was used to measure an appropriate length of catheter. A 5 French dual-lumen power injectable PICC line was cut to 40 cm and placed through the sheath. The sheath was removed. Final PICC line position was confirmed with a fluoroscopic spot image. The catheter was secured to the skin and a dressing applied. The right chest Port-A-Cath site was prepped with chlorhexidine. A sterile gown and gloves were worn during the procedure. Local anesthesia was provided with 1% lidocaine. An incision was made overlying the Port-A-Cath with a #15 scalpel. Utilizing sharp and blunt dissection, the Port-A-Cath was removed. Portable cautery was utilized. Retention sutures were removed. The pocked was irrigated with sterile saline. Wound closure was performed with subcutaneous 3-0 Monocryl, subcuticular 4-0 Vicryl and Dermabond. The entire Port-A-Cath was removed successfully. IMPRESSION: 1. Placement of left upper extremity power injectable PICC line via left brachial vein. The catheter tip lies at the SVC/RA junction. The catheter is ready for immediate use. 2. Removal of implanted Port-A-Cath utilizing sharp and blunt dissection. The procedure was uncomplicated. Electronically Signed   By: Aletta Edouard M.D.   On: 04/27/2020 17:04    Labs:  CBC: Recent Labs    04/08/20 0408 04/09/20 0402 04/10/20 0422 04/26/20 1659  WBC 9.5 10.0 11.8* 8.7  HGB 10.0*  11.1* 10.5* 12.2  HCT 32.1* 35.4* 33.4* 38.9  PLT 320  362 342 347    COAGS: Recent Labs    07/09/19 1806 04/26/20 1659  INR 0.9 1.0    BMP: Recent Labs    04/09/20 0402 04/10/20 0422 04/26/20 1659 04/27/20 0613  NA 134* 141 142 140  K 3.6 3.2* 3.7 3.8  CL 99 101 101 101  CO2 _0 GLUCOSE 183* 131* 111* 112*  BUN 27* 30* 24* 24*  CALCIUM 9.0 9.2 10.0 9.4  CREATININE 1.09* 1.02* 1.31* 1.13*  GFRNONAA 60* >60 48* 57*  GFRAA >60 >60 55* >60    LIVER FUNCTION TESTS: Recent Labs    04/01/20 1517 04/07/20 0357 04/08/20 0408 04/26/20 1659  BILITOT 0.5 0.3 0.5 0.5  AST _1 ALT 38 35 35 39  ALKPHOS 56 50 54 64  PROT 7.3 6.8 6.9 7.9  ALBUMIN 3.9 3.6 3.7 4.2    TUMOR MARKERS: No results for input(s): AFPTM, CEA, CA199, CHROMGRNA in the last 8760 hours.  Assessment and Plan:  Poor vascular access: Harlow Ohms. Mceachern, 49 year old female, presents today to the [facility] department for an image-guided port-a-cath placement.   Risks and benefits of image-guided Port-a-catheter placement were discussed with the patient including, but not limited to bleeding, infection, pneumothorax, or fibrin sheath development and need for additional procedures.  All of the patient's questions were answered, patient is agreeable to proceed.  The patient has been NPO. Vitals have been reviewed. She does not take any blood thinning medications.   Consent signed and in chart.  Thank you for this interesting consult.  I greatly enjoyed meeting CLARIS PECH and look forward to participating in their care.  A copy of this report was sent to the requesting provider on this date.  Electronically Signed: Soyla Dryer, AGACNP-BC [phone] 05/06/2020, 8:28 AM   I spent a total of  30 Minutes   in face to face in clinical consultation, greater than 50% of which was counseling/coordinating care for image-guided port-a-cath placement.

## 2020-05-06 NOTE — Discharge Instructions (Signed)
Moderate Conscious Sedation, Adult, Care After These instructions provide you with information about caring for yourself after your procedure. Your health care provider may also give you more specific instructions. Your treatment has been planned according to current medical practices, but problems sometimes occur. Call your health care provider if you have any problems or questions after your procedure. What can I expect after the procedure? After your procedure, it is common:  To feel sleepy for several hours.  To feel clumsy and have poor balance for several hours.  To have poor judgment for several hours.  To vomit if you eat too soon. Follow these instructions at home: For at least 24 hours after the procedure:   Do not: ? Participate in activities where you could fall or become injured. ? Drive. ? Use heavy machinery. ? Drink alcohol. ? Take sleeping pills or medicines that cause drowsiness. ? Make important decisions or sign legal documents. ? Take care of children on your own.  Rest. Eating and drinking  Follow the diet recommended by your health care provider.  If you vomit: ? Drink water, juice, or soup when you can drink without vomiting. ? Make sure you have little or no nausea before eating solid foods. General instructions  Have a responsible adult stay with you until you are awake and alert.  Take over-the-counter and prescription medicines only as told by your health care provider.  If you smoke, do not smoke without supervision.  Keep all follow-up visits as told by your health care provider. This is important. Contact a health care provider if:  You keep feeling nauseous or you keep vomiting.  You feel light-headed.  You develop a rash.  You have a fever. Get help right away if:  You have trouble breathing. This information is not intended to replace advice given to you by your health care provider. Make sure you discuss any questions you have  with your health care provider. Document Revised: 08/30/2017 Document Reviewed: 01/07/2016 Elsevier Patient Education  [place] Insertion Implanted port insertion is a procedure to put in a port and catheter. The port is a device with an injectable disk that can be accessed by your health care provider. The port is connected to a vein in the chest or neck by a small flexible tube (catheter). There are different types of ports. The implanted port may be used as a long-term IV access for:  Medicines, such as chemotherapy.  Fluids.  Liquid nutrition, such as total parenteral nutrition (TPN). When you have a port, this means that your health care provider will not need to use the veins in your arms for these procedures. Tell a health care provider about:  Any allergies you have.  All medicines you are taking, especially blood thinners, as well as any vitamins, herbs, eye drops, creams, over-the-counter medicines, and steroids.  Any problems you or family members have had with anesthetic medicines.  Any blood disorders you have.  Any surgeries you have had.  Any medical conditions you have or have had, including diabetes or kidney problems.  Whether you are pregnant or may be pregnant. What are the risks? Generally, this is a safe procedure. However, problems may occur, including:  Allergic reactions to medicines or dyes.  Damage to other structures or organs.  Infection.  Damage to the blood vessel, bruising, or bleeding at the puncture site.  Blood clot.  Breakdown of the skin over the port.  A collection of air in  the chest that can cause one of the lungs to collapse (pneumothorax). This is rare. What happens before the procedure? Medicines  Ask your health care provider about: ? Changing or stopping your regular medicines. This is especially important if you are taking diabetes medicines or blood thinners. ? Taking medicines such as aspirin and  ibuprofen. These medicines can thin your blood. Do not take these medicines unless your health care provider tells you to take them. ? Taking over-the-counter medicines, vitamins, herbs, and supplements. Staying hydrated Follow instructions from your health care provider about hydration, which may include:  Up to 2 hours before the procedure - you may continue to drink clear liquids, such as water, clear fruit juice, black coffee, and plain tea.  Eating and drinking restrictions  Follow instructions from your health care provider about eating and drinking, which may include: ? 8 hours before the procedure - stop eating heavy meals or foods, such as meat, fried foods, or fatty foods. ? 6 hours before the procedure - stop eating light meals or foods, such as toast or cereal. ? 6 hours before the procedure - stop drinking milk or drinks that contain milk. ? 2 hours before the procedure - stop drinking clear liquids. General instructions  Plan to have someone take you home from the hospital or clinic.  If you will be going home right after the procedure, plan to have someone with you for 24 hours.  You may have blood tests.  Do not use any products that contain nicotine or tobacco for at least 4-6 weeks before the procedure. These products include cigarettes, e-cigarettes, and chewing tobacco. If you need help quitting, ask your health care provider.  Ask your health care provider what steps will be taken to help prevent infection. These may include: ? Removing hair at the surgery site. ? Washing skin with a germ-killing soap. ? Taking antibiotic medicine. What happens during the procedure?   An IV will be inserted into one of your veins.  You will be given one or more of the following: ? A medicine to help you relax (sedative). ? A medicine to numb the area (local anesthetic).  Two small incisions will be made to insert the port. ? One smaller incision will be made in your neck to  get access to the vein where the catheter will lie. ? The other incision will be made in the upper chest. This is where the port will lie.  The procedure may be done using continuous X-ray (fluoroscopy) or other imaging tools for guidance.  The port and catheter will be placed. There may be a small, raised area where the port is.  The port will be flushed with a salt solution (saline), and blood will be drawn to make sure that it is working correctly.  The incisions will be closed.  Bandages (dressings) may be placed over the incisions. The procedure may vary among health care providers and hospitals. What happens after the procedure?  Your blood pressure, heart rate, breathing rate, and blood oxygen level will be monitored until you leave the hospital or clinic.  Do not drive for 24 hours if you were given a sedative during your procedure.  You will be given a manufacturer's information card for the type of port that you have. Keep this with you.  Your port will need to be flushed and checked as told by your health care provider, usually every few weeks.  A chest X-ray will be done to: ?  Check the placement of the port. ? Make sure there is no injury to your lung. Summary  Implanted port insertion is a procedure to put in a port and catheter.  The implanted port is used as a long-term IV access.  The port will need to be flushed and checked as told by your health care provider, usually every few weeks.  Keep your manufacturer's information card with you at all times. This information is not intended to replace advice given to you by your health care provider. Make sure you discuss any questions you have with your health care provider. Document Revised: 01/09/2019 Document Reviewed: 04/15/2018 Elsevier Patient Education  [place].

## 2020-05-10 ENCOUNTER — Telehealth: Payer: Self-pay | Admitting: *Deleted

## 2020-05-10 ENCOUNTER — Inpatient Hospital Stay: Payer: BC Managed Care – PPO | Attending: Hematology and Oncology

## 2020-05-10 DIAGNOSIS — Z452 Encounter for adjustment and management of vascular access device: Secondary | ICD-10-CM | POA: Insufficient documentation

## 2020-05-10 DIAGNOSIS — Z8571 Personal history of Hodgkin lymphoma: Secondary | ICD-10-CM | POA: Insufficient documentation

## 2020-05-10 NOTE — Telephone Encounter (Signed)
[name] at [facility] called.  Patient had port placed 8/6, PICC removed.  Home health needs verbal orders to maintain port. They also need to know if patient will be resuming IV antibiotic therapy. Patient does not have any port supplies.  Please advise, as no follow up is scheduled as of yet. Landis Gandy, RN

## 2020-05-11 ENCOUNTER — Encounter: Payer: Self-pay | Admitting: Hematology and Oncology

## 2020-05-11 NOTE — Telephone Encounter (Signed)
[name] with [facility] called to follow up on resuming IV antibiotic therapy. Currently awaiting on MD response. Will re-route message to Dr. Baxter Flattery for advise. Eugenia Mcalpine

## 2020-05-12 NOTE — Telephone Encounter (Signed)
Called [name] with [facility] to close out patient. Per previous notes picc line  No further antibiotics need. Port being maintain by oncology and scheduled for port  flushes.  [name]

## 2020-05-12 NOTE — Telephone Encounter (Signed)
She doesn't need any further antibiotics

## 2020-05-13 ENCOUNTER — Inpatient Hospital Stay: Payer: BC Managed Care – PPO

## 2020-05-18 ENCOUNTER — Other Ambulatory Visit: Payer: Self-pay

## 2020-05-18 ENCOUNTER — Inpatient Hospital Stay: Payer: BC Managed Care – PPO

## 2020-05-18 DIAGNOSIS — Z95828 Presence of other vascular implants and grafts: Secondary | ICD-10-CM

## 2020-05-18 DIAGNOSIS — Z452 Encounter for adjustment and management of vascular access device: Secondary | ICD-10-CM | POA: Diagnosis not present

## 2020-05-18 DIAGNOSIS — Z8571 Personal history of Hodgkin lymphoma: Secondary | ICD-10-CM | POA: Diagnosis not present

## 2020-05-18 MED ORDER — HEPARIN SOD (PORK) LOCK FLUSH 100 UNIT/ML IV SOLN
500.0000 [IU] | Freq: Once | INTRAVENOUS | Status: AC | PRN
  Administered 2020-05-18: 500 [IU]
  Filled 2020-05-18: qty 5

## 2020-05-18 MED ORDER — SODIUM CHLORIDE 0.9% FLUSH
10.0000 mL | INTRAVENOUS | Status: DC | PRN
  Administered 2020-05-18: 10 mL
  Filled 2020-05-18: qty 10

## 2020-05-18 NOTE — Progress Notes (Signed)
Pt requested to stay accessed for her appointment tomorrow with infectious disease. Pt also stated that they would be taking the needle out. Take home dressing and biopatch applied

## 2020-05-28 DIAGNOSIS — R911 Solitary pulmonary nodule: Secondary | ICD-10-CM | POA: Diagnosis not present

## 2020-05-31 ENCOUNTER — Telehealth: Payer: Self-pay

## 2020-05-31 ENCOUNTER — Other Ambulatory Visit: Payer: Self-pay

## 2020-05-31 ENCOUNTER — Inpatient Hospital Stay (HOSPITAL_BASED_OUTPATIENT_CLINIC_OR_DEPARTMENT_OTHER): Payer: BC Managed Care – PPO | Admitting: Medical

## 2020-05-31 VITALS — BP 120/83 | HR 98 | Temp 98.0°F | Resp 19 | Ht 65.0 in | Wt 188.0 lb

## 2020-05-31 DIAGNOSIS — Z452 Encounter for adjustment and management of vascular access device: Secondary | ICD-10-CM | POA: Diagnosis not present

## 2020-05-31 DIAGNOSIS — T148XXA Other injury of unspecified body region, initial encounter: Secondary | ICD-10-CM

## 2020-05-31 DIAGNOSIS — Z95828 Presence of other vascular implants and grafts: Secondary | ICD-10-CM

## 2020-05-31 DIAGNOSIS — Z8571 Personal history of Hodgkin lymphoma: Secondary | ICD-10-CM | POA: Diagnosis not present

## 2020-05-31 MED ORDER — HEPARIN SOD (PORK) LOCK FLUSH 100 UNIT/ML IV SOLN
500.0000 [IU] | Freq: Once | INTRAVENOUS | Status: AC | PRN
  Administered 2020-05-31: 500 [IU]
  Filled 2020-05-31: qty 5

## 2020-05-31 MED ORDER — SODIUM CHLORIDE 0.9% FLUSH
10.0000 mL | INTRAVENOUS | Status: DC | PRN
  Administered 2020-05-31: 10 mL
  Filled 2020-05-31: qty 10

## 2020-05-31 NOTE — Telephone Encounter (Signed)
TC from Pt. Stating that she has had a new port put in and it was accessed a couple of weeks ago. Pt. States that she is having pain on the side of the port and there is a lump beside the port. Pt added to symptom management schedule

## 2020-06-02 ENCOUNTER — Other Ambulatory Visit (HOSPITAL_COMMUNITY): Payer: BC Managed Care – PPO

## 2020-06-03 ENCOUNTER — Ambulatory Visit (HOSPITAL_COMMUNITY)
Admission: RE | Admit: 2020-06-03 | Discharge: 2020-06-03 | Disposition: A | Discharge status: Home / Self Care | Payer: BC Managed Care – PPO | Source: Ambulatory Visit | Attending: Medical | Admitting: Medical

## 2020-06-03 ENCOUNTER — Other Ambulatory Visit: Payer: Self-pay | Admitting: Medical

## 2020-06-03 ENCOUNTER — Other Ambulatory Visit: Payer: Self-pay

## 2020-06-03 DIAGNOSIS — T148XXA Other injury of unspecified body region, initial encounter: Secondary | ICD-10-CM

## 2020-06-03 DIAGNOSIS — Z95828 Presence of other vascular implants and grafts: Secondary | ICD-10-CM | POA: Insufficient documentation

## 2020-06-03 DIAGNOSIS — R0789 Other chest pain: Secondary | ICD-10-CM | POA: Diagnosis not present

## 2020-06-03 DIAGNOSIS — X58XXXA Exposure to other specified factors, initial encounter: Secondary | ICD-10-CM | POA: Insufficient documentation

## 2020-06-03 MED ORDER — HEPARIN SOD (PORK) LOCK FLUSH 100 UNIT/ML IV SOLN
INTRAVENOUS | Status: AC
  Filled 2020-06-03: qty 5

## 2020-06-03 MED ORDER — HYDROCODONE-ACETAMINOPHEN 5-325 MG PO TABS: 1.0000 | ORAL_TABLET | Freq: Four times a day (QID) | ORAL | 0 refills | Status: DC | PRN

## 2020-06-03 MED FILL — NORMAL SALINE FLUSH SYRINGE: 0.9 | 30 days supply | Qty: 300 | Fill #0

## 2020-06-07 ENCOUNTER — Inpatient Hospital Stay: Payer: BC Managed Care – PPO | Attending: Hematology and Oncology

## 2020-06-07 DIAGNOSIS — Z452 Encounter for adjustment and management of vascular access device: Secondary | ICD-10-CM | POA: Insufficient documentation

## 2020-06-07 DIAGNOSIS — Z8571 Personal history of Hodgkin lymphoma: Secondary | ICD-10-CM | POA: Insufficient documentation

## 2020-06-07 NOTE — Progress Notes (Signed)
[name] presents to the clinic today with report of swelling and bruising along the left lateral edge of her left chest wall Port-A-Cath.  She had originally had a right chest wall Port-A-Cath which was removed with a new left lower chest wall Port-A-Cath placed.  Since that time she has had a area of bruising along the left lateral edge with pain.  She denies any trauma or changes in activity.  She was referred for an MD evaluation of her port.  We will await the study.  There is bruising along the left lateral edge of the patient's port.  There is tenderness to touch.  The port was flushed and was found to be patent.  Sandi Mealy, MHS, PA-C Physician Assistant

## 2020-06-09 ENCOUNTER — Telehealth: Payer: Self-pay

## 2020-06-09 NOTE — Telephone Encounter (Signed)
Correspondence from [facility] Hydrocodone-acetaminophen 5-325 mg was approved from 06/07/20-06/07/21

## 2020-06-13 ENCOUNTER — Encounter: Payer: Self-pay | Admitting: Medical

## 2020-06-14 ENCOUNTER — Other Ambulatory Visit: Payer: Self-pay | Admitting: Medical

## 2020-06-14 ENCOUNTER — Encounter: Payer: Self-pay | Admitting: Medical

## 2020-06-14 MED ORDER — SODIUM CHLORIDE FLUSH 0.9 % IV SOLN: INTRAVENOUS | 1 refills | Status: DC

## 2020-06-16 ENCOUNTER — Other Ambulatory Visit (HOSPITAL_COMMUNITY): Payer: Self-pay | Admitting: Medical

## 2020-06-16 ENCOUNTER — Other Ambulatory Visit: Payer: Self-pay

## 2020-06-16 MED ORDER — SODIUM CHLORIDE FLUSH 0.9 % IV SOLN
INTRAVENOUS | 1 refills | Status: DC
Start: 2020-06-16 — End: 2020-06-24

## 2020-06-22 MED FILL — NORMAL SALINE FLUSH SYRINGE: 0.9 | 30 days supply | Qty: 300 | Fill #0

## 2020-06-24 ENCOUNTER — Other Ambulatory Visit: Payer: Self-pay | Admitting: *Deleted

## 2020-06-24 MED ORDER — SODIUM CHLORIDE FLUSH 0.9 % IV SOLN: INTRAVENOUS | 1 refills | Status: DC

## 2020-06-27 ENCOUNTER — Other Ambulatory Visit: Payer: Self-pay | Admitting: Hematology and Oncology

## 2020-06-27 ENCOUNTER — Other Ambulatory Visit: Payer: Self-pay | Admitting: *Deleted

## 2020-06-27 MED ORDER — SODIUM CHLORIDE FLUSH 0.9 % IV SOLN: INTRAVENOUS | 1 refills | Status: DC

## 2020-06-28 ENCOUNTER — Other Ambulatory Visit: Payer: Self-pay | Admitting: Hematology and Oncology

## 2020-06-28 ENCOUNTER — Other Ambulatory Visit: Payer: Self-pay | Admitting: *Deleted

## 2020-06-28 DIAGNOSIS — R110 Nausea: Secondary | ICD-10-CM

## 2020-06-28 DIAGNOSIS — R911 Solitary pulmonary nodule: Secondary | ICD-10-CM | POA: Diagnosis not present

## 2020-06-28 MED ORDER — SODIUM CHLORIDE FLUSH 0.9 % IV SOLN
INTRAVENOUS | 1 refills | Status: DC
Start: 2020-06-28 — End: 2020-10-13

## 2020-06-28 MED ORDER — HEPARIN SOD (PORK) LOCK FLUSH 100 UNIT/ML IV SOLN: 500.0000 [IU] | Freq: Every day | INTRAVENOUS | 0 refills | Status: DC

## 2020-06-28 MED FILL — NORMAL SALINE FLUSH SYRINGE: 0.9 | 30 days supply | Qty: 2400 | Fill #0

## 2020-06-28 MED FILL — HEPARIN SOD 100U/ML PFS: 100 | 30 days supply | Qty: 150 | Fill #0

## 2020-06-29 ENCOUNTER — Other Ambulatory Visit: Payer: Self-pay

## 2020-06-29 ENCOUNTER — Telehealth: Payer: Self-pay

## 2020-06-29 ENCOUNTER — Inpatient Hospital Stay: Payer: BC Managed Care – PPO

## 2020-06-29 DIAGNOSIS — Z452 Encounter for adjustment and management of vascular access device: Secondary | ICD-10-CM | POA: Diagnosis not present

## 2020-06-29 DIAGNOSIS — Z95828 Presence of other vascular implants and grafts: Secondary | ICD-10-CM

## 2020-06-29 DIAGNOSIS — Z8571 Personal history of Hodgkin lymphoma: Secondary | ICD-10-CM | POA: Diagnosis not present

## 2020-06-29 MED ORDER — SODIUM CHLORIDE 0.9% FLUSH
10.0000 mL | INTRAVENOUS | Status: DC | PRN
  Administered 2020-06-29: 10 mL
  Filled 2020-06-29: qty 10

## 2020-06-29 MED ORDER — HEPARIN SOD (PORK) LOCK FLUSH 100 UNIT/ML IV SOLN
500.0000 [IU] | Freq: Once | INTRAVENOUS | Status: AC | PRN
  Administered 2020-06-29: 500 [IU]
  Filled 2020-06-29: qty 5

## 2020-06-29 NOTE — Patient Instructions (Signed)

## 2020-06-29 NOTE — Telephone Encounter (Signed)
TC from Pt stating that she is having problems with her port. She stated she is not getting any blood return. Pt scheduled for port flush appointment today at 230.

## 2020-06-30 DIAGNOSIS — C800 Disseminated malignant neoplasm, unspecified: Secondary | ICD-10-CM | POA: Diagnosis not present

## 2020-06-30 DIAGNOSIS — Z23 Encounter for immunization: Secondary | ICD-10-CM | POA: Diagnosis not present

## 2020-06-30 DIAGNOSIS — F419 Anxiety disorder, unspecified: Secondary | ICD-10-CM | POA: Diagnosis not present

## 2020-06-30 DIAGNOSIS — Z79899 Other long term (current) drug therapy: Secondary | ICD-10-CM | POA: Diagnosis not present

## 2020-06-30 DIAGNOSIS — Z0000 Encounter for general adult medical examination without abnormal findings: Secondary | ICD-10-CM | POA: Diagnosis not present

## 2020-06-30 DIAGNOSIS — F900 Attention-deficit hyperactivity disorder, predominantly inattentive type: Secondary | ICD-10-CM | POA: Diagnosis not present

## 2020-07-05 ENCOUNTER — Inpatient Hospital Stay: Payer: BC Managed Care – PPO

## 2020-07-15 ENCOUNTER — Telehealth: Payer: Self-pay

## 2020-07-15 ENCOUNTER — Emergency Department (HOSPITAL_COMMUNITY): Payer: BC Managed Care – PPO

## 2020-07-15 ENCOUNTER — Emergency Department (HOSPITAL_COMMUNITY)
Admission: EM | Admit: 2020-07-15 | Discharge: 2020-07-15 | Disposition: A | Discharge status: Home / Self Care | Payer: BC Managed Care – PPO | Attending: Emergency Medicine | Admitting: Emergency Medicine

## 2020-07-15 ENCOUNTER — Encounter (HOSPITAL_COMMUNITY): Payer: Self-pay | Admitting: Radiology

## 2020-07-15 DIAGNOSIS — Z7982 Long term (current) use of aspirin: Secondary | ICD-10-CM | POA: Diagnosis not present

## 2020-07-15 DIAGNOSIS — Z7951 Long term (current) use of inhaled steroids: Secondary | ICD-10-CM | POA: Diagnosis not present

## 2020-07-15 DIAGNOSIS — Z85850 Personal history of malignant neoplasm of thyroid: Secondary | ICD-10-CM | POA: Diagnosis not present

## 2020-07-15 DIAGNOSIS — R1084 Generalized abdominal pain: Secondary | ICD-10-CM | POA: Diagnosis not present

## 2020-07-15 DIAGNOSIS — K219 Gastro-esophageal reflux disease without esophagitis: Secondary | ICD-10-CM | POA: Diagnosis not present

## 2020-07-15 DIAGNOSIS — J45909 Unspecified asthma, uncomplicated: Secondary | ICD-10-CM | POA: Diagnosis not present

## 2020-07-15 DIAGNOSIS — I509 Heart failure, unspecified: Secondary | ICD-10-CM | POA: Insufficient documentation

## 2020-07-15 DIAGNOSIS — R112 Nausea with vomiting, unspecified: Secondary | ICD-10-CM

## 2020-07-15 DIAGNOSIS — Z79899 Other long term (current) drug therapy: Secondary | ICD-10-CM | POA: Insufficient documentation

## 2020-07-15 DIAGNOSIS — Z853 Personal history of malignant neoplasm of breast: Secondary | ICD-10-CM | POA: Insufficient documentation

## 2020-07-15 DIAGNOSIS — Z8541 Personal history of malignant neoplasm of cervix uteri: Secondary | ICD-10-CM | POA: Diagnosis not present

## 2020-07-15 DIAGNOSIS — I110 Hypertensive heart disease with heart failure: Secondary | ICD-10-CM | POA: Insufficient documentation

## 2020-07-15 DIAGNOSIS — R109 Unspecified abdominal pain: Secondary | ICD-10-CM | POA: Diagnosis not present

## 2020-07-15 LAB — URINALYSIS, ROUTINE W REFLEX MICROSCOPIC
Bilirubin Urine: NEGATIVE
Glucose, UA: NEGATIVE mg/dL
Hgb urine dipstick: NEGATIVE
Ketones, ur: NEGATIVE mg/dL
Leukocytes,Ua: NEGATIVE
Nitrite: NEGATIVE
Protein, ur: NEGATIVE mg/dL
Specific Gravity, Urine: 1.005 (ref 1.005–1.030)
pH: 7 (ref 5.0–8.0)

## 2020-07-15 LAB — COMPREHENSIVE METABOLIC PANEL
ALT: 45 U/L — ABNORMAL HIGH (ref 0–44)
AST: 33 U/L (ref 15–41)
Albumin: 4.2 g/dL (ref 3.5–5.0)
Alkaline Phosphatase: 55 U/L (ref 38–126)
Anion gap: 11 (ref 5–15)
BUN: 20 mg/dL (ref 6–20)
CO2: 27 mmol/L (ref 22–32)
Calcium: 9 mg/dL (ref 8.9–10.3)
Chloride: 100 mmol/L (ref 98–111)
Creatinine, Ser: 1.22 mg/dL — ABNORMAL HIGH (ref 0.44–1.00)
GFR, Estimated: 52 mL/min — ABNORMAL LOW (ref >60)
Glucose, Bld: 120 mg/dL — ABNORMAL HIGH (ref 70–99)
Potassium: 3.1 mmol/L — ABNORMAL LOW (ref 3.5–5.1)
Sodium: 138 mmol/L (ref 135–145)
Total Bilirubin: 0.4 mg/dL (ref 0.3–1.2)
Total Protein: 8.1 g/dL (ref 6.5–8.1)

## 2020-07-15 LAB — CBC WITH DIFFERENTIAL/PLATELET
Abs Immature Granulocytes: 0.03 10*3/uL (ref 0.00–0.07)
Basophils Absolute: 0.1 10*3/uL (ref 0.0–0.1)
Basophils Relative: 1 %
Eosinophils Absolute: 0.1 10*3/uL (ref 0.0–0.5)
Eosinophils Relative: 1 %
HCT: 38.2 % (ref 36.0–46.0)
Hemoglobin: 12.1 g/dL (ref 12.0–15.0)
Immature Granulocytes: 0 %
Lymphocytes Relative: 24 %
Lymphs Abs: 2.2 10*3/uL (ref 0.7–4.0)
MCH: 27.9 pg (ref 26.0–34.0)
MCHC: 31.7 g/dL (ref 30.0–36.0)
MCV: 88.2 fL (ref 80.0–100.0)
Monocytes Absolute: 0.7 10*3/uL (ref 0.1–1.0)
Monocytes Relative: 8 %
Neutro Abs: 6.1 10*3/uL (ref 1.7–7.7)
Neutrophils Relative %: 66 %
Platelets: 357 10*3/uL (ref 150–400)
RBC: 4.33 MIL/uL (ref 3.87–5.11)
RDW: 14.4 % (ref 11.5–15.5)
WBC: 9.1 10*3/uL (ref 4.0–10.5)
nRBC: 0 % (ref 0.0–0.2)

## 2020-07-15 LAB — POC OCCULT BLOOD, ED: Fecal Occult Bld: NEGATIVE

## 2020-07-15 LAB — LIPASE, BLOOD: Lipase: 38 U/L (ref 11–51)

## 2020-07-15 MED ORDER — POTASSIUM CHLORIDE ER 10 MEQ PO TBCR: 10.0000 meq | EXTENDED_RELEASE_TABLET | Freq: Every day | ORAL | 0 refills | Status: DC

## 2020-07-15 MED ORDER — FENTANYL CITRATE (PF) 100 MCG/2ML IJ SOLN
50.0000 ug | Freq: Once | INTRAMUSCULAR | Status: AC
  Administered 2020-07-15: 50 ug via INTRAVENOUS
  Filled 2020-07-15: qty 2

## 2020-07-15 MED ORDER — POTASSIUM CHLORIDE ER 20 MEQ PO TBCR: 20.0000 meq | EXTENDED_RELEASE_TABLET | Freq: Every day | ORAL | 0 refills | Status: DC

## 2020-07-15 MED ORDER — DIPHENHYDRAMINE HCL 50 MG/ML IJ SOLN
12.5000 mg | Freq: Once | INTRAMUSCULAR | Status: AC
  Administered 2020-07-15: 12.5 mg via INTRAVENOUS
  Filled 2020-07-15: qty 1

## 2020-07-15 MED ORDER — POTASSIUM CHLORIDE CRYS ER 20 MEQ PO TBCR
40.0000 meq | EXTENDED_RELEASE_TABLET | Freq: Once | ORAL | Status: AC
  Administered 2020-07-15: 40 meq via ORAL
  Filled 2020-07-15: qty 2

## 2020-07-15 MED ORDER — SODIUM CHLORIDE (PF) 0.9 % IJ SOLN
INTRAMUSCULAR | Status: AC
  Filled 2020-07-15: qty 50

## 2020-07-15 MED ORDER — DIPHENHYDRAMINE HCL 50 MG/ML IJ SOLN: 12.5000 mg | Freq: Once | INTRAMUSCULAR | Status: DC

## 2020-07-15 MED ORDER — IOHEXOL 300 MG/ML  SOLN
100.0000 mL | Freq: Once | INTRAMUSCULAR | Status: AC | PRN
  Administered 2020-07-15: 100 mL via INTRAVENOUS

## 2020-07-15 MED ORDER — DIPHENHYDRAMINE HCL 50 MG/ML IJ SOLN
25.0000 mg | Freq: Once | INTRAMUSCULAR | Status: AC
  Administered 2020-07-15: 25 mg via INTRAVENOUS
  Filled 2020-07-15: qty 1

## 2020-07-15 NOTE — ED Triage Notes (Signed)
Pt arrived via EMS with c/o severe abd pain with nausea.  Pt is a cancer pt but not currently going thru chemo. Sent by her PCP for possible SBO

## 2020-07-15 NOTE — Telephone Encounter (Signed)
Pt called, tearful stating she is in 10/10 abd pain and states she can not hold clear liquids down, and she is passing blood, as well as "orange mucous from rectum" and states "it does not smell like normal stool." Pt states she saw her PCP and they told her she has at least a partial blockage, but no imaging was ordered to confirm; however, pt had GI referral entered to be seen at [facility] and appt was scheduled for next month.  Pt is also complaining of redness and soreness to port site. Per Dr Lindi Adie, pt should be seen in ED for further evaluation. Pt voices concerns about going to ED and sitting in waiting room as she is immunocompromised. This LPN called Charge RN for ED to alert that pt is on the way. Pt states she may also call EMS as her husband can not leave their son.

## 2020-07-15 NOTE — ED Notes (Signed)
Pt discharged from this ED in stable condition at this time. All discharge instructions and follow up care reviewed with pt with no further questions at this time. Pt ambulatory with steady gait, clear speech.

## 2020-07-15 NOTE — ED Notes (Signed)
Per oncology office, patient complaining of abdominal pain and mucous in her stool-also having pain and redness at porta cath site

## 2020-07-15 NOTE — ED Provider Notes (Signed)
[facility] DEPT Provider Note   CSN: [id number] Arrival date & time: 07/15/20  1306     History Chief Complaint  Patient presents with  . Abdominal Pain  . Nausea    [name] is a 49 y.o. female with medical history significant ofadrenal insufficiency on steroids, pituitary tumor status post resection, aortic stenosis, multiple malignancies (breast cancer status post bilateral mastectomy, Hodgkin's lymphoma, thyroid cancer status post resection, cervical cancer-all in remission), pulmonary hypertension, right heart failure, chronic respiratory failure on 3 to 4 L home oxygen, cough variant asthma, anxiety, depression, GERD.  Patient presents via EMS for nausea, vomiting, severe abdominal generalized pain.  Does have recent admission in July for  bacteremia associated with port.  Patient states that she went to PCP, about a month ago  when she started having abdominal pain with associated bright red blood per rectum, is getting worked up with back currently at [facility], states that she does want a GI doctor here instead of [facility].  Patient states that she came to the emergency department because she started having new worsening abdominal pain for the past 3 days, has not had bowel movement in 3 days or passed gas in the past 3 days.  No hematemesis or coffee-ground emesis.  States that she has not had any recent abdominal surgeries, however multiple years ago did have appendectomy, hysterectomy x2, ovarian torsion surgery.  Denies any vaginal complaints, vaginal bleeding or vaginal pain.  States that she has not been eating because she feels nauseous.  States that she did take a double dose of her steroids today in regards to her Addison's disease.  No fevers, chills, chest pain, shortness of breath, back pain, dysuria, hematuria.  Patient also states that she is worried about her port since she has had some tenderness in this area, no erythema or warmth, no  fevers.  HPI     Past Medical History:  Diagnosis Date  . Addison's disease ([place])   . Adrenal insufficiency ([place])   . Anemia   . Anxiety   . Aortic stenosis   . Aortic stenosis   . Appendicitis 12/19/09  . Appendicitis   . Breast cancer ([place])    STATUS POST BILATERAL MASTECTOMY. STATUS POST RECONSTRUCTION. SHE HAD SILICONE BREAST IMPLANTS AND THE LEFT IMPLANT IS LEAKING SLIGHTLY  . Cellulitis of right middle finger 11/07/2018  . Cervical cancer ([place]) 12/23/2018  . Chest pain   . CHF with right heart failure ([place]) 04/17/2017  . Chronic respiratory failure with hypoxia ([place]) 12/23/2018  . Cough variant asthma 04/13/2019  . Depression   . GERD (gastroesophageal reflux disease)   . Headache    migraines  . Heart murmur   . History of kidney stones   . Hodgkin lymphoma (HCC)    STATUS POST MANTLE RADIATION  . Hodgkin's lymphoma ([place])    1987  . Hypertension   . Hypoxia   . Necrotizing fasciitis ([place]) 12/23/2018  . Non-ischemic cardiomyopathy ([place])   . Osteoporosis   . Palpitations   . Pituitary adenoma ([place]) 12/23/2018  . Pneumonia   . PONV (postoperative nausea and vomiting)   . Pre-diabetes    per pt; no meds  . Pulmonary hypertension ([place]) 12/23/2018  . Raynaud phenomenon   . Right heart failure ([place]) 04/17/2017  . Supplemental oxygen dependent    3 liters  . SVT (supraventricular tachycardia) ([place])   . Tachycardia   . Thyroid cancer ([place])    STATUS POST SURGICAL  REMOVAL-CURRENT ON THYROID REPLACEMENT    Patient Active Problem List   Diagnosis Date Noted  . AKI (acute kidney injury) ([place]) 04/27/2020  . Vasculopathy 04/27/2020  . Bacteremia 04/19/2020  . Addison's disease ([place])   . Nausea 04/01/2020  . Generalized weakness 02/10/2020  . Headache 02/10/2020  . Subcutaneous nodule 02/01/2020  . Port-A-Cath in place 09/09/2019  . Moderate persistent asthma without complication 81/10/7508  . Fever 07/21/2019  . Superficial thrombophlebitis 07/16/2019  . Injury of  left index finger 12/24/2018  . History of pituitary adenoma 12/23/2018  . History of cervical cancer 12/23/2018  . Chronic respiratory failure with hypoxia ([place]) 12/23/2018  . SOB (shortness of breath)   . Nephrolithiasis 11/07/2018  . Oxygen dependent 11/07/2018  . Migraine 11/07/2018  . PVC's (premature ventricular contractions) 10/27/2018  . Hyperlipidemia 03/09/2014  . Hx of Hodgkins lymphoma   . History of ductal carcinoma in situ (DCIS) of breast   . History of thyroid cancer   . Adrenal insufficiency ([place])   . Hypertension   . Raynaud phenomenon   . GERD (gastroesophageal reflux disease)     Past Surgical History:  Procedure Laterality Date  . ABDOMINAL HYSTERECTOMY    . AMPUTATION Left 01/30/2019   Procedure: Left Index finger amputation with flap reconstruction and repair reconstruction;  Surgeon: Roseanne Kaufman, MD;  Location: [facility];  Service: Orthopedics;  Laterality: Left;  . APPENDECTOMY    . CARDIAC CATHETERIZATION  05/18/09   NORMAL CATH  . I & D EXTREMITY Left 12/23/2018   Procedure: IRRIGATION AND DEBRIDEMENT HAND / INDEX FINGER;  Surgeon: Roseanne Kaufman, MD;  Location: [facility];  Service: Orthopedics;  Laterality: Left;  . IR IMAGING GUIDED PORT INSERTION  05/06/2020  . IR REMOVAL TUN ACCESS W/ PORT W/O FL MOD SED  04/27/2020  . MASTECTOMY    . PITUITARY SURGERY    . RIGHT/LEFT HEART CATH AND CORONARY ANGIOGRAPHY N/A 04/02/2018   Procedure: RIGHT/LEFT HEART CATH AND CORONARY ANGIOGRAPHY;  Surgeon: Burnell Blanks, MD;  Location: [facility];  Service: Cardiovascular;  Laterality: N/A;  . TOTAL THYROIDECTOMY    . VIDEO BRONCHOSCOPY Bilateral 11/14/2018   Procedure: VIDEO BRONCHOSCOPY WITHOUT FLUORO;  Surgeon: Margaretha Seeds, MD;  Location: [facility];  Service: Cardiopulmonary;  Laterality: Bilateral;  . VIDEO BRONCHOSCOPY WITH ENDOBRONCHIAL ULTRASOUND N/A 11/19/2018   Procedure: VIDEO BRONCHOSCOPY WITH ENDOBRONCHIAL ULTRASOUND;  Surgeon: Margaretha Seeds, MD;  Location: [facility];  Service: Thoracic;  Laterality: N/A;     OB History   No obstetric history on file.     Family History  Family history unknown: Yes    Social History   Tobacco Use  . Smoking status: Never Smoker  . Smokeless tobacco: Never Used  Vaping Use  . Vaping Use: Never used  Substance Use Topics  . Alcohol use: Yes    Comment: social   . Drug use: No    Home Medications Prior to Admission medications   Medication Sig Start Date End Date Taking? Authorizing Provider  albuterol (2.5 MG/3ML) 0.083% NEBU 3 mL, albuterol (5 MG/ML) 0.5% NEBU 0.5 mL Inhale 3 mg into the lungs every 6 (six) hours as needed (SOB).    Yes [provider]  albuterol (VENTOLIN HFA) 108 (90 Base) MCG/ACT inhaler Inhale 2 puffs into the lungs every 6 (six) hours as needed for wheezing or shortness of breath.   Yes [provider]  alum & mag hydroxide-simeth (MAALOX/MYLANTA) 200-200-20 MG/5ML suspension Take 30  mLs by mouth daily as needed for indigestion or heartburn. For Bleeding Ulcers   Yes [provider]  beclomethasone (QVAR) 80 MCG/ACT inhaler Inhale 1 puff into the lungs 2 (two) times daily.    Yes [provider]  fluticasone furoate-vilanterol (BREO ELLIPTA) 200-25 MCG/INH AEPB Inhale 1 puff into the lungs daily.   Yes [provider]  aspirin 325 MG tablet Take 325 mg by mouth at bedtime.     [provider]  benzonatate (TESSALON) 200 MG capsule Take 1 capsule (200 mg total) by mouth 3 (three) times daily as needed for cough. 04/15/19   Martyn Ehrich, NP  clonazePAM (KLONOPIN) 1 MG tablet Take 1 mg by mouth 2 (two) times daily.     [provider]  colchicine 0.6 MG tablet Take 0.6 mg by mouth daily in the afternoon.  12/31/19   [provider]  dexlansoprazole (DEXILANT) 60 MG capsule Take 60 mg by mouth daily.      [provider]  diphenhydrAMINE (BENADRYL) 50 MG/ML injection Inject 1 mL (50  mg total) into the vein every 6 (six) hours as needed (nausea). 12/25/18   Rai, Vernelle Emerald, MD  dronabinol (MARINOL) 2.5 MG capsule Take 1 capsule (2.5 mg total) by mouth 2 (two) times daily as needed (nausea). 04/10/20   Gherghe, Vella Redhead, MD  EPINEPHRINE 0.3 mg/0.3 mL IJ SOAJ injection INJECT 0.3 MLS (0.3 MG TOTAL) INTO THE MUSCLE AS NEEDED FOR ANAPHYLAXIS. Patient taking differently: Inject 0.3 mg into the muscle daily as needed for anaphylaxis.  12/09/19   Margaretha Seeds, MD  escitalopram (LEXAPRO) 20 MG tablet Take 20 mg by mouth every evening.  04/24/16   [provider]  furosemide (LASIX) 40 MG tablet Take 1 tablet (40 mg total) by mouth in the morning and at bedtime. 04/10/20   Gherghe, Vella Redhead, MD  Galcanezumab-gnlm 120 MG/ML SOAJ Inject 120 mg into the skin every 28 (twenty-eight) days.  03/31/19   [provider]  heparin lock flush 100 UNIT/ML SOLN injection 5 mLs (500 Units total) by Intracatheter route daily. Via port a cath flush with hep lock after medication administration daily 06/28/20   Nicholas Lose, MD  HYDROcodone-acetaminophen (NORCO) 5-325 MG tablet Take 1 tablet by mouth every 6 (six) hours as needed for moderate pain. 06/03/20   Tanner, Lyndon Code., PA-C  hydrocortisone (CORTEF) 10 MG tablet Take 1-2 tablets (10-20 mg total) by mouth See admin instructions. Take 20 mg in the am and 49m in the evening Patient taking differently: Take 10-20 mg by mouth See admin instructions. Take 20 mg by mouth in the morning and 173min the evening 02/17/20   KyCherylann Ratel, DO  levothyroxine (SYNTHROID, LEVOTHROID) 112 MCG tablet Take 112 mcg by mouth daily before breakfast.    [provider]  LORazepam (ATIVAN) 0.5 MG tablet TAKE 1-2 TABLETS (0.5-1 MG TOTAL) BY MOUTH 2 (TWO) TIMES DAILY AS NEEDED (NAUSEA). 06/28/20   GuNicholas LoseMD  Mepolizumab (NUCALA) 100 MG SOLR Inject 100 mg into the skin every 28 (twenty-eight) days. 05/13/19   ElMargaretha SeedsMD  metoprolol  succinate (TOPROL-XL) 25 MG 24 hr tablet Take 37.5 mg by mouth daily.     [provider]  OXYGEN Inhale 4 L into the lungs continuous.     [provider]  potassium chloride (KLOR-CON) 10 MEQ tablet Take 1 tablet (10 mEq total) by mouth daily for 21 days. 07/15/20 08/05/20  PaAlfredia ClientPA-C  Rimegepant Sulfate (NURTEC) 75 MG TBDP Take 75 mg by mouth daily as needed (Migraine).     [provider]  rosuvastatin (CRESTOR) 10 MG tablet Take 10 mg by mouth daily.  02/28/18   [provider]  silver sulfADIAZINE (SILVADENE) 1 % cream Apply 1 application topically daily as needed (Open Wound).  11/16/19   [provider]  sodium chloride 0.9 % infusion Use as directed. Patient not taking: Reported on 05/06/2020 04/19/20   Nicholas Lose, MD  sodium chloride flush 0.9 % SOLN injection Use two 10 mL sodium chloride flush 0.9% before and after fluids and medication administration, to maintain patency up to 4 times daily. 06/28/20   Nicholas Lose, MD  sucralfate (CARAFATE) 1 g tablet Take 1 g by mouth daily as needed (FOR ULCERS).     [provider]  topiramate (TOPAMAX) 50 MG tablet Take 150 mg by mouth at bedtime.  12/25/19   [provider]  zolpidem (AMBIEN CR) 12.5 MG CR tablet Take 12.5 mg by mouth at bedtime.      [provider]    Allergies    Ferrous bisglycinate chelate [iron], Mushroom extract complex, Na ferric gluc cplx in sucrose, Cymbalta [duloxetine hcl], Succinylcholine, Buprenorphine hcl, Compazine, Metoclopramide, Morphine and related, Ondansetron, Promethazine hcl, and Tegaderm ag mesh [silver]  Review of Systems   Review of Systems  Constitutional: Negative for chills, diaphoresis, fatigue and fever.  HENT: Negative for congestion, sore throat and trouble swallowing.   Eyes: Negative for pain and visual disturbance.  Respiratory: Negative for cough, shortness of breath and wheezing.   Cardiovascular: Negative for  chest pain, palpitations and leg swelling.  Gastrointestinal: Positive for abdominal distention, abdominal pain, nausea and vomiting. Negative for diarrhea.  Genitourinary: Negative for difficulty urinating.  Musculoskeletal: Negative for back pain, neck pain and neck stiffness.  Skin: Negative for pallor.  Neurological: Negative for dizziness, speech difficulty, weakness, light-headedness, numbness and headaches.  Psychiatric/Behavioral: Negative for confusion.    Physical Exam Updated Vital Signs BP 124/82   Pulse 85   Temp 98.2 F (36.8 C)   Resp 17   LMP  (LMP Unknown)   SpO2 100%   Physical Exam Constitutional:      General: She is not in acute distress.    Appearance: Normal appearance. She is not ill-appearing, toxic-appearing or diaphoretic.  HENT:     Mouth/Throat:     Mouth: Mucous membranes are moist.     Pharynx: Oropharynx is clear.  Eyes:     General: No scleral icterus.    Extraocular Movements: Extraocular movements intact.     Pupils: Pupils are equal, round, and reactive to light.  Cardiovascular:     Rate and Rhythm: Normal rate and regular rhythm.     Pulses: Normal pulses.     Heart sounds: Normal heart sounds.  Pulmonary:     Effort: Pulmonary effort is normal. No respiratory distress.     Breath sounds: Normal breath sounds. No stridor. No wheezing, rhonchi or rales.  Chest:     Chest wall: No tenderness.  Abdominal:     General: Abdomen is flat. Bowel sounds are normal. There is no distension.     Palpations: Abdomen is soft.     Tenderness: There is generalized abdominal tenderness. There is guarding. There is no right CVA tenderness, left CVA tenderness or rebound. Negative signs include Murphy's sign, Rovsing's sign, McBurney's sign, psoas sign and obturator sign.  Genitourinary:    Comments: Chaperone  present. Digital Rectal exam reveals sphincter with good tone. No external hemorrhoids, masses, or fissures. Stool color is brown with no overt  blood. No gross melena.   Musculoskeletal:        General: No swelling or tenderness. Normal range of motion.     Cervical back: Normal range of motion and neck supple. No rigidity.     Right lower leg: No edema.     Left lower leg: No edema.  Skin:    General: Skin is warm and dry.     Capillary Refill: Capillary refill takes less than 2 seconds.     Coloration: Skin is not pale.     Comments: Port in place on left side, does not appear infected.  Is slightly tender to palpation, no erythema or warmth or rashes.  Neurological:     General: No focal deficit present.     Mental Status: She is alert and oriented to person, place, and time.  Psychiatric:        Mood and Affect: Mood normal.        Behavior: Behavior normal.     ED Results / Procedures / Treatments   Labs (all labs ordered are listed, but only abnormal results are displayed) Labs Reviewed  COMPREHENSIVE METABOLIC PANEL - Abnormal; Notable for the following components:      Result Value   Potassium 3.1 (*)    Glucose, Bld 120 (*)    Creatinine, Ser 1.22 (*)    ALT 45 (*)    GFR, Estimated 52 (*)    All other components within normal limits  URINALYSIS, ROUTINE W REFLEX MICROSCOPIC - Abnormal; Notable for the following components:   Color, Urine STRAW (*)    All other components within normal limits  CULTURE, BLOOD (ROUTINE X 2)  CULTURE, BLOOD (ROUTINE X 2)  CBC WITH DIFFERENTIAL/PLATELET  LIPASE, BLOOD  POC OCCULT BLOOD, ED    EKG None  Radiology CT Abdomen Pelvis W Contrast  Result Date: 07/15/2020 CLINICAL DATA:  Abdominal pain. Concern for obstruction. History of multiple malignancies. EXAM: CT ABDOMEN AND PELVIS WITH CONTRAST TECHNIQUE: Multidetector CT imaging of the abdomen and pelvis was performed using the standard protocol following bolus administration of intravenous contrast. CONTRAST:  148m OMNIPAQUE IOHEXOL 300 MG/ML  SOLN COMPARISON:  MRI abdomen dated Feb 01, 2020. CT abdomen pelvis dated  January 17, 2020. FINDINGS: Lower chest: No acute abnormality. Unchanged 11 x 8 mm fat density nodule at the right lung base (series 6, image 7), stable since 2016, benign. Hepatobiliary: Scattered tiny hepatic cysts are unchanged. No new focal liver abnormality. The gallbladder is unremarkable. No biliary dilatation. Pancreas: Unremarkable. No pancreatic ductal dilatation or surrounding inflammatory changes. Spleen: Normal in size without focal abnormality. Adrenals/Urinary Tract: The adrenal glands are unremarkable. Unchanged tiny bilateral renal cysts. Unchanged small left renal calculi measuring up to 4 mm. No hydronephrosis. The bladder is unremarkable. Stomach/Bowel: Stomach is within normal limits. Prior appendectomy. No evidence of bowel wall thickening, distention, or inflammatory changes. Vascular/Lymphatic: Aortic atherosclerosis. No enlarged abdominal or pelvic lymph nodes. Reproductive: Status post hysterectomy. No adnexal masses. Other: Unchanged tiny fat containing umbilical hernia. No free fluid or pneumoperitoneum. Musculoskeletal: No acute or significant osseous findings. Injection related changes in the subcutaneous fat of the anterior abdominal wall and both flanks. IMPRESSION: 1. No acute intra-abdominal process. No evidence of bowel obstruction. 2. Unchanged nonobstructive left nephrolithiasis. 3. Aortic Atherosclerosis (ICD10-I70.0). Electronically Signed   By: WTitus DubinM.D.   On:  07/15/2020 16:56    Procedures Procedures (including critical care time)  Medications Ordered in ED Medications  sodium chloride (PF) 0.9 % injection (has no administration in time range)  fentaNYL (SUBLIMAZE) injection 50 mcg (has no administration in time range)  potassium chloride SA (KLOR-CON) CR tablet 40 mEq (has no administration in time range)  fentaNYL (SUBLIMAZE) injection 50 mcg (50 mcg Intravenous Given 07/15/20 1447)  diphenhydrAMINE (BENADRYL) injection 25 mg (25 mg Intravenous Given  07/15/20 1446)  fentaNYL (SUBLIMAZE) injection 50 mcg (50 mcg Intravenous Given 07/15/20 1607)  diphenhydrAMINE (BENADRYL) injection 12.5 mg (12.5 mg Intravenous Given 07/15/20 1606)  iohexol (OMNIPAQUE) 300 MG/ML solution 100 mL (100 mLs Intravenous Contrast Given 07/15/20 1628)    ED Course  I have reviewed the triage vital signs and the nursing notes.  Pertinent labs & imaging results that were available during my care of the patient were reviewed by me and considered in my medical decision making (see chart for details).  Clinical Course as of Jul 15 1745  Fri Jul 15, 2020  1531 This is a 49 year old female with a history of multiple abdominal surgeries, ovarian torsion s/p oopherectomy, presenting to emerge department nausea and vomiting and abdominal bloating.  She reports has not been able to pass gas or bowel movement 3 days.  She reports diffuse bloating of her abdomen.  She states that her doctor told her that she had a "partial bowel obstruction" earlier in the month but she was trying to manage it at home.  Today she has had persistent vomiting.  On exam she does appear uncomfortable.  She does have abdominal distention with mild diffuse tenderness.  This is consistent with a possible bowel obstruction.  She will get a CT scan of the abdomen.  We will also give her IV pain medication and benadryl for nausea (she is allergic to all other nausea medications).  She does have a chest wall port as she is a  significant vasculopath but states that she has been having pain and erythema here for the past 10 days, we will not use the site.   [MT]  1532 Clinically I believe this is most likely bowel obstruction.  We will follow-up on the scan.   [MT]  1704 No SBO on CT scan   [MT]  1739 CT scan showed no acute findings.  Still possible she may have an ileus, will begin repleting her potassium.  She strongly prefers to go home and can tolerate PO.  We talked about her port site, which she is  concerned may be infected malfunctioning.  I would advise that she not use this.  We can draw blood cultures, but at this time she does not have systemic signs of infection or sepsis.  I advised her that if cultures come back positive, she will need to return immediately to the hospital for IV antibiotics.  She verbalized understanding.   [MT]    Clinical Course User Index [MT] Trifan, Carola Rhine, MD   MDM Rules/Calculators/A&P                         Patient with significant list of comorbidities including 4 different cancers on remission and Addison's that presents the emergency department today for nausea, vomiting and abdominal tenderness.  Differential to include SBO, colitis, pancreatitis, LBO, perforation.  Rectal exam normal, no gross blood, Hemoccult negative.  Patient's main concern is cancer in her abdomen.  Initial interventions fentanyl and Benadryl,  patient is allergic to Zofran. Labs demonstrated CBC without any leukocytosis, lipase normal.  CMP remarkable for potassium of 3.1, creatinine 1.22 this appears baseline for patient.  CT abdomen negative for SBO or any acute intra-abdominal pathology.  Upon reassessment patient states that she feels better, still wants 1 more dose of fentanyl before discharge.  States that she does feel better than when she came in, no episodes of vomiting while being here for 4 and half hours.  Patient passed p.o. challenge with potassium which we repleted here today.  In regards to her port site, did not use this today.  Dr. Langston Masker talked to patient about this, patient wants blood cultures at this time.  Does not appear warm or infected, however patient is worried that she could have recurrent bacteremia.  Patient's white count is normal, afebrile, nontachycardic.  Blood cultures will be attempted, patient expressed understanding for need to come back for IV antibiotics if these are positive.  Patient to be discharged, will refer to GI doctor since patient  wants referral here.Doubt need for further emergent work up at this time. I explained the diagnosis and have given explicit precautions to return to the ER including for any other new or worsening symptoms. The patient understands and accepts the medical plan as it's been dictated and I have answered their questions. Discharge instructions concerning home care and prescriptions have been given. The patient is STABLE and is discharged to home in good condition.  I discussed this case with my attending physician who cosigned this note including patient's presenting symptoms, physical exam, and planned diagnostics and interventions. Attending physician stated agreement with plan or made changes to plan which were implemented.   Attending physician assessed patient at bedside.    *Final Clinical Impression(s) / ED Diagnoses Final diagnoses:  Generalized abdominal pain  Intractable vomiting with nausea, unspecified vomiting type    Rx / DC Orders ED Discharge Orders         Ordered    potassium chloride 20 MEQ TBCR  Daily,   Status:  Discontinued        07/15/20 1731    potassium chloride (KLOR-CON) 10 MEQ tablet  Daily        07/15/20 1737           Alfredia Client, PA-C 07/15/20 1747    Wyvonnia Dusky, MD 07/15/20 1819

## 2020-07-15 NOTE — ED Notes (Signed)
POC occult blood test ran in mini lab, result was negative. RN and provider aware.

## 2020-07-15 NOTE — Discharge Instructions (Addendum)
Your work-up today was reassuring, your potassium was slightly low at 3.1, we did replete that here in the ER, I will also prescribe you some to take for the next couple of days. I want you to follow-up with a GI doctor, I referred you to one here since you wanted a referral here.  Please come back to the emergency department for any new or worsening concerning symptoms.  Please use the attached instructions.

## 2020-07-15 NOTE — Telephone Encounter (Signed)
[name] was transferred to me for symptom management.  She called and was tearful due to belly pain.  She has not had a colonoscopy and needs a stat colonoscopy or at least imaging.  She said she has not gone to the bathroom in 3 days and now is not passing gas either.  She is in a lot of pain.  [facility] said they would put her on their waiting list for a colonoscopy. Her port is also red around the area and tender to the touch.  Called Mickel Baas who will deliver this message to Dr. Lindi Adie ASAP and call [name] back. Gardiner Rhyme, RN

## 2020-07-15 NOTE — ED Notes (Signed)
Unable to obtain second set of blood cultures, one set sent to lab. Provider aware

## 2020-07-19 DIAGNOSIS — K5900 Constipation, unspecified: Secondary | ICD-10-CM | POA: Diagnosis not present

## 2020-07-19 DIAGNOSIS — R140 Abdominal distension (gaseous): Secondary | ICD-10-CM | POA: Diagnosis not present

## 2020-07-19 DIAGNOSIS — Z5181 Encounter for therapeutic drug level monitoring: Secondary | ICD-10-CM | POA: Diagnosis not present

## 2020-07-19 DIAGNOSIS — J45991 Cough variant asthma: Secondary | ICD-10-CM | POA: Diagnosis not present

## 2020-07-19 DIAGNOSIS — Z20822 Contact with and (suspected) exposure to covid-19: Secondary | ICD-10-CM | POA: Diagnosis not present

## 2020-07-19 DIAGNOSIS — R112 Nausea with vomiting, unspecified: Secondary | ICD-10-CM | POA: Diagnosis not present

## 2020-07-19 DIAGNOSIS — R0602 Shortness of breath: Secondary | ICD-10-CM | POA: Diagnosis not present

## 2020-07-19 DIAGNOSIS — K625 Hemorrhage of anus and rectum: Secondary | ICD-10-CM | POA: Diagnosis not present

## 2020-07-19 DIAGNOSIS — Z79899 Other long term (current) drug therapy: Secondary | ICD-10-CM | POA: Diagnosis not present

## 2020-07-19 DIAGNOSIS — R1084 Generalized abdominal pain: Secondary | ICD-10-CM | POA: Diagnosis not present

## 2020-07-19 DIAGNOSIS — R1911 Absent bowel sounds: Secondary | ICD-10-CM | POA: Diagnosis not present

## 2020-07-20 LAB — CULTURE, BLOOD (ROUTINE X 2)
Culture: NO GROWTH
Special Requests: ADEQUATE

## 2020-07-27 ENCOUNTER — Other Ambulatory Visit: Payer: Self-pay | Admitting: Hematology and Oncology

## 2020-07-27 MED FILL — HEPARIN SOD 100U/ML PFS: 100 | 30 days supply | Qty: 150 | Fill #0

## 2020-07-27 MED FILL — NORMAL SALINE FLUSH SYRINGE: 0.9 | 30 days supply | Qty: 2400 | Fill #1

## 2020-07-28 DIAGNOSIS — R911 Solitary pulmonary nodule: Secondary | ICD-10-CM | POA: Diagnosis not present

## 2020-08-01 DIAGNOSIS — E785 Hyperlipidemia, unspecified: Secondary | ICD-10-CM | POA: Diagnosis not present

## 2020-08-01 DIAGNOSIS — N182 Chronic kidney disease, stage 2 (mild): Secondary | ICD-10-CM | POA: Diagnosis not present

## 2020-08-01 DIAGNOSIS — Z7982 Long term (current) use of aspirin: Secondary | ICD-10-CM | POA: Diagnosis not present

## 2020-08-01 DIAGNOSIS — K219 Gastro-esophageal reflux disease without esophagitis: Secondary | ICD-10-CM | POA: Diagnosis not present

## 2020-08-01 DIAGNOSIS — I129 Hypertensive chronic kidney disease with stage 1 through stage 4 chronic kidney disease, or unspecified chronic kidney disease: Secondary | ICD-10-CM | POA: Diagnosis not present

## 2020-08-01 DIAGNOSIS — J45909 Unspecified asthma, uncomplicated: Secondary | ICD-10-CM | POA: Diagnosis not present

## 2020-08-01 DIAGNOSIS — K5900 Constipation, unspecified: Secondary | ICD-10-CM | POA: Diagnosis not present

## 2020-08-01 DIAGNOSIS — K640 First degree hemorrhoids: Secondary | ICD-10-CM | POA: Diagnosis not present

## 2020-08-01 DIAGNOSIS — K920 Hematemesis: Secondary | ICD-10-CM | POA: Diagnosis not present

## 2020-08-01 DIAGNOSIS — K449 Diaphragmatic hernia without obstruction or gangrene: Secondary | ICD-10-CM | POA: Diagnosis not present

## 2020-08-01 DIAGNOSIS — E2740 Unspecified adrenocortical insufficiency: Secondary | ICD-10-CM | POA: Diagnosis not present

## 2020-08-01 DIAGNOSIS — E039 Hypothyroidism, unspecified: Secondary | ICD-10-CM | POA: Diagnosis not present

## 2020-08-01 DIAGNOSIS — K644 Residual hemorrhoidal skin tags: Secondary | ICD-10-CM | POA: Diagnosis not present

## 2020-08-01 DIAGNOSIS — Z79899 Other long term (current) drug therapy: Secondary | ICD-10-CM | POA: Diagnosis not present

## 2020-08-01 DIAGNOSIS — R194 Change in bowel habit: Secondary | ICD-10-CM | POA: Diagnosis not present

## 2020-08-01 DIAGNOSIS — K317 Polyp of stomach and duodenum: Secondary | ICD-10-CM | POA: Diagnosis not present

## 2020-08-01 DIAGNOSIS — K625 Hemorrhage of anus and rectum: Secondary | ICD-10-CM | POA: Diagnosis not present

## 2020-08-02 ENCOUNTER — Inpatient Hospital Stay: Payer: BC Managed Care – PPO | Attending: Hematology and Oncology

## 2020-08-15 ENCOUNTER — Other Ambulatory Visit: Payer: Self-pay | Admitting: Medical

## 2020-08-16 ENCOUNTER — Other Ambulatory Visit: Payer: Self-pay | Admitting: Hematology and Oncology

## 2020-08-16 NOTE — Progress Notes (Signed)
Patient Care Team: Toppin, Antionette Poles, MD as PCP - General (Internal Medicine) Nahser, Wonda Cheng, MD as PCP - Cardiology (Cardiology) Maylon Peppers, MD (Family Medicine)  DIAGNOSIS:    ICD-10-CM   1. Grade I hemorrhoids  K64.0 Ambulatory referral to General Surgery    SUMMARY OF ONCOLOGIC HISTORY: Oncology History  Hx of Hodgkins lymphoma   Initial Diagnosis   Hx of Hodgkins lymphoma    Radiation Therapy     History of thyroid cancer   Initial Diagnosis   Thyroid cancer ([place])    Remission      Remission      Radiation Therapy     History of cervical cancer  12/23/2018 Initial Diagnosis   History of cervical cancer    Remission      Radiation Therapy       CHIEF COMPLIANT: Follow-up of SVT on surveillance    INTERVAL HISTORY: [name] is a 49 y.o. 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 She presents to the clinic today for follow-up. [facility] complaining of bright red stool per rectum. She sees a GI doc in [place]. Shes planning to see a Psychiatric nurse for breast recostruction  ALLERGIES:  is allergic to ferrous bisglycinate chelate [iron], mushroom extract complex, na ferric gluc cplx in sucrose, cymbalta [duloxetine hcl], succinylcholine, buprenorphine hcl, compazine, metoclopramide, morphine and related, ondansetron, promethazine hcl, and tegaderm ag mesh [silver].  MEDICATIONS:  Current Outpatient Medications  Medication Sig Dispense Refill  . albuterol (2.5 MG/3ML) 0.083% NEBU 3 mL, albuterol (5 MG/ML) 0.5% NEBU 0.5 mL Inhale 3 mg into the lungs every 6 (six) hours as needed (SOB).     Marland Kitchen albuterol (VENTOLIN HFA) 108 (90 Base) MCG/ACT inhaler Inhale 2 puffs into the lungs every 6 (six) hours as needed for wheezing or shortness of breath.    Marland Kitchen alum & mag hydroxide-simeth (MAALOX/MYLANTA) 200-200-20 MG/5ML suspension Take 30 mLs by mouth daily as needed for  indigestion or heartburn. For Bleeding Ulcers    . aspirin 325 MG tablet Take 325 mg by mouth at bedtime.     . beclomethasone (QVAR) 80 MCG/ACT inhaler Inhale 1 puff into the lungs 2 (two) times daily.     . benzonatate (TESSALON) 200 MG capsule Take 1 capsule (200 mg total) by mouth 3 (three) times daily as needed for cough. 45 capsule 1  . clonazePAM (KLONOPIN) 1 MG tablet Take 1 mg by mouth 2 (two) times daily.     . colchicine 0.6 MG tablet Take 0.6 mg by mouth daily in the afternoon.     Marland Kitchen dexlansoprazole (DEXILANT) 60 MG capsule Take 60 mg by mouth daily.      . diphenhydrAMINE (BENADRYL) 50 MG/ML injection Inject 1 mL (50 mg total) into the vein every 6 (six) hours as needed (nausea). 25 mL 0  . dronabinol (MARINOL) 2.5 MG capsule TAKE 1 CAPSULE (2.5 MG TOTAL) BY MOUTH 2 (TWO) TIMES DAILY AS NEEDED (NAUSEA). 30 capsule 0  . EPINEPHRINE 0.3 mg/0.3 mL IJ SOAJ injection INJECT 0.3 MLS (0.3 MG TOTAL) INTO THE MUSCLE AS NEEDED FOR ANAPHYLAXIS. (Patient taking differently: Inject 0.3 mg into the muscle daily as needed for anaphylaxis. ) 2 each 3  . escitalopram (LEXAPRO) 20 MG tablet Take 20 mg by mouth every evening.   1  . fluticasone furoate-vilanterol (BREO ELLIPTA) 200-25 MCG/INH AEPB Inhale 1 puff into the lungs daily.    . furosemide (  LASIX) 40 MG tablet Take 1 tablet (40 mg total) by mouth in the morning and at bedtime. 60 tablet 0  . Galcanezumab-gnlm 120 MG/ML SOAJ Inject 120 mg into the skin every 28 (twenty-eight) days.     . heparin lock flush 100 UNIT/ML SOLN injection USE 5 MLS (500 UNITS TOTAL) IN PORT A CATH ONCE DAILY AFTER MEDICATION ADMINISTRATION AS A HEPLOCK AS DIRECTED. 150 mL 0  . HYDROcodone-acetaminophen (NORCO) 5-325 MG tablet Take 1 tablet by mouth every 6 (six) hours as needed for moderate pain. 30 tablet 0  . hydrocortisone (CORTEF) 10 MG tablet Take 1-2 tablets (10-20 mg total) by mouth See admin instructions. Take 20 mg in the am and 25m in the evening (Patient  taking differently: Take 10-20 mg by mouth See admin instructions. Take 20 mg by mouth in the morning and 181min the evening)    . levothyroxine (SYNTHROID, LEVOTHROID) 112 MCG tablet Take 112 mcg by mouth daily before breakfast.    . LORazepam (ATIVAN) 0.5 MG tablet TAKE 1-2 TABLETS (0.5-1 MG TOTAL) BY MOUTH 2 (TWO) TIMES DAILY AS NEEDED (NAUSEA). 30 tablet 2  . Mepolizumab (NUCALA) 100 MG SOLR Inject 100 mg into the skin every 28 (twenty-eight) days. 1 each 3  . metoprolol succinate (TOPROL-XL) 25 MG 24 hr tablet Take 37.5 mg by mouth daily.     . OXYGEN Inhale 4 L into the lungs continuous.     . potassium chloride (KLOR-CON) 10 MEQ tablet Take 1 tablet (10 mEq total) by mouth daily for 21 days. 21 tablet 0  . Rimegepant Sulfate (NURTEC) 75 MG TBDP Take 75 mg by mouth daily as needed (Migraine).     . rosuvastatin (CRESTOR) 10 MG tablet Take 10 mg by mouth daily.     . silver sulfADIAZINE (SILVADENE) 1 % cream Apply 1 application topically daily as needed (Open Wound).     . sodium chloride 0.9 % infusion Use as directed. (Patient not taking: Reported on 05/06/2020) 300 mL PRN  . sodium chloride flush 0.9 % SOLN injection Use two 10 mL sodium chloride flush 0.9% before and after fluids and medication administration, to maintain patency up to 4 times daily. 2400 mL 1  . sucralfate (CARAFATE) 1 g tablet Take 1 g by mouth daily as needed (FOR ULCERS).     . Marland Kitchenopiramate (TOPAMAX) 50 MG tablet Take 150 mg by mouth at bedtime.     . Marland Kitchenolpidem (AMBIEN CR) 12.5 MG CR tablet Take 12.5 mg by mouth at bedtime.       Current Facility-Administered Medications  Medication Dose Route Frequency Provider Last Rate Last Admin  . Mepolizumab SOLR 100 mg  100 mg Subcutaneous Q28 days ElMargaretha SeedsMD   100 mg at 08/31/19 1017    PHYSICAL EXAMINATION: ECOG PERFORMANCE STATUS: 2 - Symptomatic, <50% confined to bed  There were no vitals filed for this visit. There were no vitals filed for this  visit.  LABORATORY DATA:  I have reviewed the data as listed CMP Latest Ref Rng & Units 07/15/2020 04/27/2020 04/26/2020  Glucose 70 - 99 mg/dL 120(H) 112(H) 111(H)  BUN 6 - 20 mg/dL 20 24(H) 24(H)  Creatinine 0.44 - 1.00 mg/dL 1.22(H) 1.13(H) 1.31(H)  Sodium 135 - 145 mmol/L 138 140 142  Potassium 3.5 - 5.1 mmol/L 3.1(L) 3.8 3.7  Chloride 98 - 111 mmol/L 100 101 101  CO2 22 - 32 mmol/L _0 Calcium 8.9 - 10.3 mg/dL 9.0 9.4 10.0  Total Protein 6.5 - 8.1 g/dL 8.1 - 7.9  Total Bilirubin 0.3 - 1.2 mg/dL 0.4 - 0.5  Alkaline Phos 38 - 126 U/L 55 - 64  AST 15 - 41 U/L 33 - 29  ALT 0 - 44 U/L 45(H) - 39    Lab Results  Component Value Date   WBC 9.1 07/15/2020   HGB 12.1 07/15/2020   HCT 38.2 07/15/2020   MCV 88.2 07/15/2020   PLT 357 07/15/2020   NEUTROABS 6.1 07/15/2020    ASSESSMENT & PLAN:  No problem-specific Assessment & Plan notes found for this encounter.    Orders Placed This Encounter  Procedures  . Ambulatory referral to General Surgery    Referral Priority:   Routine    Referral Type:   Surgical    Referral Reason:   Specialty Services Required    Requested Specialty:   General Surgery    Number of Visits Requested:   1   The patient has a good understanding of the overall plan. she agrees with it. she will call with any problems that may develop before the next visit here.  Total time spent: 30 mins including face to face time and time spent for planning, charting and coordination of care  Nicholas Lose, MD 08/17/2020  I, Cloyde Reams Dorshimer, am acting as scribe for Dr. Nicholas Lose.  I have reviewed the above documentation for accuracy and completeness, and I agree with the above.

## 2020-08-16 NOTE — Telephone Encounter (Signed)
Dr. Lindi Adie, You see her tomorrow.  For your review to refill or not. Gardiner Rhyme, RN

## 2020-08-17 ENCOUNTER — Inpatient Hospital Stay (HOSPITAL_BASED_OUTPATIENT_CLINIC_OR_DEPARTMENT_OTHER): Payer: BC Managed Care – PPO | Admitting: Hematology and Oncology

## 2020-08-17 DIAGNOSIS — K640 First degree hemorrhoids: Secondary | ICD-10-CM | POA: Diagnosis not present

## 2020-08-22 ENCOUNTER — Other Ambulatory Visit: Payer: Self-pay | Admitting: Hematology and Oncology

## 2020-08-22 MED FILL — NORMAL SALINE FLUSH SYRINGE: 0.9 | 7 days supply | Qty: 600 | Fill #0

## 2020-08-22 MED FILL — HEPARIN SOD 100U/ML PFS: 100 | 30 days supply | Qty: 150 | Fill #0

## 2020-08-28 DIAGNOSIS — R911 Solitary pulmonary nodule: Secondary | ICD-10-CM | POA: Diagnosis not present

## 2020-08-29 ENCOUNTER — Encounter: Payer: Self-pay | Admitting: Hematology and Oncology

## 2020-08-29 ENCOUNTER — Other Ambulatory Visit: Payer: Self-pay

## 2020-08-29 DIAGNOSIS — D649 Anemia, unspecified: Secondary | ICD-10-CM

## 2020-08-29 DIAGNOSIS — Z86000 Personal history of in-situ neoplasm of breast: Secondary | ICD-10-CM

## 2020-08-30 ENCOUNTER — Other Ambulatory Visit: Payer: Self-pay

## 2020-08-30 ENCOUNTER — Inpatient Hospital Stay: Payer: BC Managed Care – PPO

## 2020-08-30 DIAGNOSIS — Z86000 Personal history of in-situ neoplasm of breast: Secondary | ICD-10-CM

## 2020-08-30 DIAGNOSIS — Z8571 Personal history of Hodgkin lymphoma: Secondary | ICD-10-CM

## 2020-08-30 DIAGNOSIS — D649 Anemia, unspecified: Secondary | ICD-10-CM

## 2020-09-05 MED FILL — NORMAL SALINE FLUSH SYRINGE: 0.9 | 7 days supply | Qty: 600 | Fill #1

## 2020-09-05 MED FILL — HEPARIN SOD 100U/ML PFS: 100 | 30 days supply | Qty: 150 | Fill #0

## 2020-09-07 DIAGNOSIS — G43009 Migraine without aura, not intractable, without status migrainosus: Secondary | ICD-10-CM | POA: Diagnosis not present

## 2020-09-07 DIAGNOSIS — G8929 Other chronic pain: Secondary | ICD-10-CM | POA: Diagnosis not present

## 2020-09-07 DIAGNOSIS — G43109 Migraine with aura, not intractable, without status migrainosus: Secondary | ICD-10-CM | POA: Diagnosis not present

## 2020-09-07 DIAGNOSIS — M545 Low back pain, unspecified: Secondary | ICD-10-CM | POA: Diagnosis not present

## 2020-09-07 DIAGNOSIS — M519 Unspecified thoracic, thoracolumbar and lumbosacral intervertebral disc disorder: Secondary | ICD-10-CM | POA: Diagnosis not present

## 2020-09-18 ENCOUNTER — Other Ambulatory Visit: Payer: Self-pay | Admitting: Hematology and Oncology

## 2020-09-18 DIAGNOSIS — R110 Nausea: Secondary | ICD-10-CM

## 2020-09-20 ENCOUNTER — Other Ambulatory Visit: Payer: Self-pay | Admitting: Hematology and Oncology

## 2020-09-20 MED FILL — NORMAL SALINE FLUSH SYRINGE: 0.9 | 14 days supply | Qty: 1200 | Fill #2

## 2020-09-22 ENCOUNTER — Other Ambulatory Visit: Payer: BC Managed Care – PPO

## 2020-09-22 ENCOUNTER — Other Ambulatory Visit: Payer: Self-pay

## 2020-09-22 ENCOUNTER — Inpatient Hospital Stay: Payer: BC Managed Care – PPO

## 2020-09-22 ENCOUNTER — Inpatient Hospital Stay: Payer: BC Managed Care – PPO | Attending: Hematology and Oncology

## 2020-09-22 DIAGNOSIS — Z95828 Presence of other vascular implants and grafts: Secondary | ICD-10-CM

## 2020-09-22 DIAGNOSIS — R739 Hyperglycemia, unspecified: Secondary | ICD-10-CM | POA: Insufficient documentation

## 2020-09-22 DIAGNOSIS — Z86718 Personal history of other venous thrombosis and embolism: Secondary | ICD-10-CM | POA: Insufficient documentation

## 2020-09-22 DIAGNOSIS — D649 Anemia, unspecified: Secondary | ICD-10-CM

## 2020-09-22 DIAGNOSIS — Z85850 Personal history of malignant neoplasm of thyroid: Secondary | ICD-10-CM | POA: Diagnosis not present

## 2020-09-22 DIAGNOSIS — Z9013 Acquired absence of bilateral breasts and nipples: Secondary | ICD-10-CM | POA: Diagnosis not present

## 2020-09-22 DIAGNOSIS — K640 First degree hemorrhoids: Secondary | ICD-10-CM | POA: Insufficient documentation

## 2020-09-22 DIAGNOSIS — Z923 Personal history of irradiation: Secondary | ICD-10-CM | POA: Insufficient documentation

## 2020-09-22 DIAGNOSIS — Z86000 Personal history of in-situ neoplasm of breast: Secondary | ICD-10-CM

## 2020-09-22 DIAGNOSIS — Z8541 Personal history of malignant neoplasm of cervix uteri: Secondary | ICD-10-CM | POA: Insufficient documentation

## 2020-09-22 DIAGNOSIS — Z8571 Personal history of Hodgkin lymphoma: Secondary | ICD-10-CM | POA: Diagnosis not present

## 2020-09-22 DIAGNOSIS — E785 Hyperlipidemia, unspecified: Secondary | ICD-10-CM | POA: Diagnosis not present

## 2020-09-22 DIAGNOSIS — Z853 Personal history of malignant neoplasm of breast: Secondary | ICD-10-CM | POA: Diagnosis not present

## 2020-09-22 LAB — VITAMIN D 25 HYDROXY (VIT D DEFICIENCY, FRACTURES): Vit D, 25-Hydroxy: 27.78 ng/mL — ABNORMAL LOW (ref 30–100)

## 2020-09-22 LAB — LIPID PANEL
Cholesterol: 178 mg/dL (ref 0–200)
HDL: 44 mg/dL (ref >40)
LDL Cholesterol: 83 mg/dL (ref 0–99)
Total CHOL/HDL Ratio: 4 RATIO
Triglycerides: 253 mg/dL — ABNORMAL HIGH (ref <150)
VLDL: 51 mg/dL — ABNORMAL HIGH (ref 0–40)

## 2020-09-22 LAB — CBC WITH DIFFERENTIAL (CANCER CENTER ONLY)
Abs Immature Granulocytes: 0.02 10*3/uL (ref 0.00–0.07)
Basophils Absolute: 0.1 10*3/uL (ref 0.0–0.1)
Basophils Relative: 1 %
Eosinophils Absolute: 0.2 10*3/uL (ref 0.0–0.5)
Eosinophils Relative: 3 %
HCT: 38.8 % (ref 36.0–46.0)
Hemoglobin: 12.1 g/dL (ref 12.0–15.0)
Immature Granulocytes: 0 %
Lymphocytes Relative: 41 %
Lymphs Abs: 3.5 10*3/uL (ref 0.7–4.0)
MCH: 27 pg (ref 26.0–34.0)
MCHC: 31.2 g/dL (ref 30.0–36.0)
MCV: 86.6 fL (ref 80.0–100.0)
Monocytes Absolute: 0.9 10*3/uL (ref 0.1–1.0)
Monocytes Relative: 11 %
Neutro Abs: 3.8 10*3/uL (ref 1.7–7.7)
Neutrophils Relative %: 44 %
Platelet Count: 356 10*3/uL (ref 150–400)
RBC: 4.48 MIL/uL (ref 3.87–5.11)
RDW: 14.6 % (ref 11.5–15.5)
WBC Count: 8.5 10*3/uL (ref 4.0–10.5)
nRBC: 0 % (ref 0.0–0.2)

## 2020-09-22 LAB — HEMOGLOBIN A1C
Hgb A1c MFr Bld: 6.3 % — ABNORMAL HIGH (ref 4.8–5.6)
Mean Plasma Glucose: 134.11 mg/dL

## 2020-09-22 LAB — CMP (CANCER CENTER ONLY)
ALT: 52 U/L — ABNORMAL HIGH (ref 0–44)
AST: 32 U/L (ref 15–41)
Albumin: 3.9 g/dL (ref 3.5–5.0)
Alkaline Phosphatase: 79 U/L (ref 38–126)
Anion gap: 11 (ref 5–15)
BUN: 23 mg/dL — ABNORMAL HIGH (ref 6–20)
CO2: 29 mmol/L (ref 22–32)
Calcium: 9.6 mg/dL (ref 8.9–10.3)
Chloride: 101 mmol/L (ref 98–111)
Creatinine: 1.33 mg/dL — ABNORMAL HIGH (ref 0.44–1.00)
GFR, Estimated: 49 mL/min — ABNORMAL LOW (ref >60)
Glucose, Bld: 100 mg/dL — ABNORMAL HIGH (ref 70–99)
Potassium: 3.3 mmol/L — ABNORMAL LOW (ref 3.5–5.1)
Sodium: 141 mmol/L (ref 135–145)
Total Bilirubin: 0.3 mg/dL (ref 0.3–1.2)
Total Protein: 7.9 g/dL (ref 6.5–8.1)

## 2020-09-22 LAB — T4, FREE: Free T4: 1.18 ng/dL — ABNORMAL HIGH (ref 0.61–1.12)

## 2020-09-22 LAB — TSH: TSH: <0.08 u[IU]/mL — ABNORMAL LOW (ref 0.308–3.960)

## 2020-09-22 LAB — VITAMIN B12: Vitamin B-12: 409 pg/mL (ref 180–914)

## 2020-09-22 MED ORDER — HEPARIN SOD (PORK) LOCK FLUSH 100 UNIT/ML IV SOLN
500.0000 [IU] | Freq: Once | INTRAVENOUS | Status: AC | PRN
  Administered 2020-09-22: 10:00:00 500 [IU]
  Filled 2020-09-22: qty 5

## 2020-09-22 MED ORDER — SODIUM CHLORIDE 0.9% FLUSH
10.0000 mL | INTRAVENOUS | Status: DC | PRN
  Administered 2020-09-22: 10:00:00 10 mL
  Filled 2020-09-22: qty 10

## 2020-09-22 MED FILL — HEPARIN SOD 100U/ML PFS: 100 | 30 days supply | Qty: 150 | Fill #0

## 2020-09-27 ENCOUNTER — Inpatient Hospital Stay: Payer: BC Managed Care – PPO

## 2020-09-27 DIAGNOSIS — R911 Solitary pulmonary nodule: Secondary | ICD-10-CM | POA: Diagnosis not present

## 2020-10-04 ENCOUNTER — Inpatient Hospital Stay: Payer: BC Managed Care – PPO

## 2020-10-04 ENCOUNTER — Other Ambulatory Visit: Payer: Self-pay

## 2020-10-04 ENCOUNTER — Encounter: Payer: Self-pay | Admitting: Hematology and Oncology

## 2020-10-04 ENCOUNTER — Telehealth: Payer: Self-pay

## 2020-10-04 ENCOUNTER — Inpatient Hospital Stay: Payer: BC Managed Care – PPO | Attending: Hematology and Oncology | Admitting: Hematology and Oncology

## 2020-10-04 DIAGNOSIS — R42 Dizziness and giddiness: Secondary | ICD-10-CM

## 2020-10-04 DIAGNOSIS — I890 Lymphedema, not elsewhere classified: Secondary | ICD-10-CM | POA: Insufficient documentation

## 2020-10-04 DIAGNOSIS — Z86000 Personal history of in-situ neoplasm of breast: Secondary | ICD-10-CM

## 2020-10-04 DIAGNOSIS — Z923 Personal history of irradiation: Secondary | ICD-10-CM | POA: Diagnosis not present

## 2020-10-04 DIAGNOSIS — D649 Anemia, unspecified: Secondary | ICD-10-CM | POA: Diagnosis not present

## 2020-10-04 DIAGNOSIS — F064 Anxiety disorder due to known physiological condition: Secondary | ICD-10-CM | POA: Insufficient documentation

## 2020-10-04 DIAGNOSIS — R5383 Other fatigue: Secondary | ICD-10-CM

## 2020-10-04 DIAGNOSIS — Z8541 Personal history of malignant neoplasm of cervix uteri: Secondary | ICD-10-CM | POA: Diagnosis not present

## 2020-10-04 DIAGNOSIS — Z8572 Personal history of non-Hodgkin lymphomas: Secondary | ICD-10-CM | POA: Diagnosis not present

## 2020-10-04 DIAGNOSIS — Z85850 Personal history of malignant neoplasm of thyroid: Secondary | ICD-10-CM | POA: Insufficient documentation

## 2020-10-04 DIAGNOSIS — K625 Hemorrhage of anus and rectum: Secondary | ICD-10-CM | POA: Insufficient documentation

## 2020-10-04 DIAGNOSIS — R609 Edema, unspecified: Secondary | ICD-10-CM | POA: Insufficient documentation

## 2020-10-04 DIAGNOSIS — Z95828 Presence of other vascular implants and grafts: Secondary | ICD-10-CM

## 2020-10-04 LAB — CBC WITH DIFFERENTIAL (CANCER CENTER ONLY)
Abs Immature Granulocytes: 0.01 10*3/uL (ref 0.00–0.07)
Basophils Absolute: 0.1 10*3/uL (ref 0.0–0.1)
Basophils Relative: 1 %
Eosinophils Absolute: 0.2 10*3/uL (ref 0.0–0.5)
Eosinophils Relative: 3 %
HCT: 38.3 % (ref 36.0–46.0)
Hemoglobin: 11.9 g/dL — ABNORMAL LOW (ref 12.0–15.0)
Immature Granulocytes: 0 %
Lymphocytes Relative: 39 %
Lymphs Abs: 2.7 10*3/uL (ref 0.7–4.0)
MCH: 26.6 pg (ref 26.0–34.0)
MCHC: 31.1 g/dL (ref 30.0–36.0)
MCV: 85.7 fL (ref 80.0–100.0)
Monocytes Absolute: 0.9 10*3/uL (ref 0.1–1.0)
Monocytes Relative: 13 %
Neutro Abs: 3.1 10*3/uL (ref 1.7–7.7)
Neutrophils Relative %: 44 %
Platelet Count: 361 10*3/uL (ref 150–400)
RBC: 4.47 MIL/uL (ref 3.87–5.11)
RDW: 14.6 % (ref 11.5–15.5)
WBC Count: 7 10*3/uL (ref 4.0–10.5)
nRBC: 0 % (ref 0.0–0.2)

## 2020-10-04 LAB — CMP (CANCER CENTER ONLY)
ALT: 42 U/L (ref 0–44)
AST: 26 U/L (ref 15–41)
Albumin: 3.8 g/dL (ref 3.5–5.0)
Alkaline Phosphatase: 74 U/L (ref 38–126)
Anion gap: 9 (ref 5–15)
BUN: 17 mg/dL (ref 6–20)
CO2: 28 mmol/L (ref 22–32)
Calcium: 9.4 mg/dL (ref 8.9–10.3)
Chloride: 104 mmol/L (ref 98–111)
Creatinine: 1.25 mg/dL — ABNORMAL HIGH (ref 0.44–1.00)
GFR, Estimated: 53 mL/min — ABNORMAL LOW (ref >60)
Glucose, Bld: 116 mg/dL — ABNORMAL HIGH (ref 70–99)
Potassium: 3.1 mmol/L — ABNORMAL LOW (ref 3.5–5.1)
Sodium: 141 mmol/L (ref 135–145)
Total Bilirubin: 0.3 mg/dL (ref 0.3–1.2)
Total Protein: 7.9 g/dL (ref 6.5–8.1)

## 2020-10-04 LAB — SAMPLE TO BLOOD BANK

## 2020-10-04 LAB — ABO/RH: ABO/RH(D): O POS

## 2020-10-04 LAB — VITAMIN B12: Vitamin B-12: 400 pg/mL (ref 180–914)

## 2020-10-04 MED ORDER — BUMETANIDE 1 MG PO TABS: 1.0000 mg | ORAL_TABLET | Freq: Two times a day (BID) | ORAL | 3 refills | Status: DC

## 2020-10-04 MED ORDER — HEPARIN SOD (PORK) LOCK FLUSH 100 UNIT/ML IV SOLN
500.0000 [IU] | Freq: Once | INTRAVENOUS | Status: AC | PRN
  Administered 2020-10-04: 500 [IU]
  Filled 2020-10-04: qty 5

## 2020-10-04 MED ORDER — SODIUM CHLORIDE 0.9% FLUSH
10.0000 mL | INTRAVENOUS | Status: DC | PRN
  Administered 2020-10-04: 10 mL
  Filled 2020-10-04: qty 10

## 2020-10-04 NOTE — Assessment & Plan Note (Signed)
Urgent visit today complaining of blood per rectum and low blood pressures: Lab review:

## 2020-10-04 NOTE — Telephone Encounter (Signed)
RN called patient regarding previous my chart message.    Pt reporting low blood pressure, dizziness, and states she is having some concerns with GI bleeding due to clots passing.  Pt requesting to be seen by MD.   RN encouraged patient to report to ED given symptoms. Pt declines wanting to go to ED due to not "feeling safe with patient's out in the waiting area."   RN reviewed with MD - Recommendations for lab draw and he will evaluate today.  RN verbalized with patient, patient can not come til this afternoon due to transportation.  Appointments coordinated appropriately.

## 2020-10-04 NOTE — Progress Notes (Signed)
Patient Care Team: Toppin, Antionette Poles, MD as PCP - General (Internal Medicine) Nahser, Wonda Cheng, MD as PCP - Cardiology (Cardiology) Maylon Peppers, MD (Family Medicine)  DIAGNOSIS:  Encounter Diagnosis  Name Primary?  . History of ductal carcinoma in situ (DCIS) of breast     SUMMARY OF ONCOLOGIC HISTORY: Oncology History  Hx of Hodgkins lymphoma   Initial Diagnosis   Hx of Hodgkins lymphoma    Radiation Therapy     History of thyroid cancer   Initial Diagnosis   Thyroid cancer ([facility])    Remission      Remission      Radiation Therapy     History of cervical cancer  12/23/2018 Initial Diagnosis   History of cervical cancer    Remission      Radiation Therapy       CHIEF COMPLIANT: Urgent visit complaining of feeling low blood pressure tachycardia and bright red blood per rectum with clots  INTERVAL HISTORY: [name] is a 50 year old with very complex medical history including multiple cancers who has diffuse fluid retention and lymphedema.  She is currently on Lasix 40 twice daily but tells me that she is not urinating significantly.  She has multiple physicians including gastroenterologist at [facility] who have done previously scopes on her.  She had endoscopy in December apparently.  She thinks she has hemorrhoidal bleeding.  She felt dizzy lightheaded and hypotensive at home and wanted to come and see [place].  Initially we discussed about her going to the emergency room but she wanted to be seen with our clinic.  On arrival her blood pressure appeared to be stabilized.  She did not have significant tachycardia.  She tells me that she took extra dose of steroids which appeared to help her. She is extremely concerned about the fluid retention that she is experiencing diffusely throughout her body.  She is in tears stressed out about all of her health issues.   ALLERGIES:  is allergic to ferrous bisglycinate chelate [iron], mushroom extract complex, na ferric gluc  cplx in sucrose, cymbalta [duloxetine hcl], succinylcholine, buprenorphine hcl, compazine, metoclopramide, morphine and related, ondansetron, promethazine hcl, and tegaderm ag mesh [silver].  MEDICATIONS:  Current Outpatient Medications  Medication Sig Dispense Refill  . bumetanide (BUMEX) 1 MG tablet Take 1 tablet (1 mg total) by mouth 2 (two) times daily. 60 tablet 3  . albuterol (2.5 MG/3ML) 0.083% NEBU 3 mL, albuterol (5 MG/ML) 0.5% NEBU 0.5 mL Inhale 3 mg into the lungs every 6 (six) hours as needed (SOB).     Marland Kitchen albuterol (VENTOLIN HFA) 108 (90 Base) MCG/ACT inhaler Inhale 2 puffs into the lungs every 6 (six) hours as needed for wheezing or shortness of breath.    Marland Kitchen alum & mag hydroxide-simeth (MAALOX/MYLANTA) 200-200-20 MG/5ML suspension Take 30 mLs by mouth daily as needed for indigestion or heartburn. For Bleeding Ulcers    . aspirin 325 MG tablet Take 325 mg by mouth at bedtime.     . beclomethasone (QVAR) 80 MCG/ACT inhaler Inhale 1 puff into the lungs 2 (two) times daily.     . benzonatate (TESSALON) 200 MG capsule Take 1 capsule (200 mg total) by mouth 3 (three) times daily as needed for cough. 45 capsule 1  . clonazePAM (KLONOPIN) 1 MG tablet Take 1 mg by mouth 2 (two) times daily.     . colchicine 0.6 MG tablet Take 0.6 mg by mouth daily in the afternoon.     Marland Kitchen  dexlansoprazole (DEXILANT) 60 MG capsule Take 60 mg by mouth daily.      . diphenhydrAMINE (BENADRYL) 50 MG/ML injection Inject 1 mL (50 mg total) into the vein every 6 (six) hours as needed (nausea). 25 mL 0  . dronabinol (MARINOL) 2.5 MG capsule TAKE 1 CAPSULE (2.5 MG TOTAL) BY MOUTH 2 (TWO) TIMES DAILY AS NEEDED (NAUSEA). 30 capsule 0  . EPINEPHRINE 0.3 mg/0.3 mL IJ SOAJ injection INJECT 0.3 MLS (0.3 MG TOTAL) INTO THE MUSCLE AS NEEDED FOR ANAPHYLAXIS. (Patient taking differently: Inject 0.3 mg into the muscle daily as needed for anaphylaxis. ) 2 each 3  . escitalopram (LEXAPRO) 20 MG tablet Take 20 mg by mouth every  evening.   1  . fluticasone furoate-vilanterol (BREO ELLIPTA) 200-25 MCG/INH AEPB Inhale 1 puff into the lungs daily.    . Galcanezumab-gnlm 120 MG/ML SOAJ Inject 120 mg into the skin every 28 (twenty-eight) days.     . heparin lock flush 100 UNIT/ML SOLN injection USE 5 MLS IN PORT A CATH ONCE DAILY AFTER MEDICATION ADMINISTRATION AS A HEPLOCK AS DIRECTED. 150 mL 0  . HYDROcodone-acetaminophen (NORCO) 5-325 MG tablet Take 1 tablet by mouth every 6 (six) hours as needed for moderate pain. 30 tablet 0  . hydrocortisone (CORTEF) 10 MG tablet Take 1-2 tablets (10-20 mg total) by mouth See admin instructions. Take 20 mg in the am and 53m in the evening (Patient taking differently: Take 10-20 mg by mouth See admin instructions. Take 20 mg by mouth in the morning and 170min the evening)    . levothyroxine (SYNTHROID, LEVOTHROID) 112 MCG tablet Take 112 mcg by mouth daily before breakfast.    . LORazepam (ATIVAN) 0.5 MG tablet TAKE 1-2 TABLETS (0.5-1 MG TOTAL) BY MOUTH 2 (TWO) TIMES DAILY AS NEEDED (NAUSEA). 30 tablet 2  . Mepolizumab (NUCALA) 100 MG SOLR Inject 100 mg into the skin every 28 (twenty-eight) days. 1 each 3  . metoprolol succinate (TOPROL-XL) 25 MG 24 hr tablet Take 37.5 mg by mouth daily.     . OXYGEN Inhale 4 L into the lungs continuous.     . potassium chloride (KLOR-CON) 10 MEQ tablet Take 1 tablet (10 mEq total) by mouth daily for 21 days. 21 tablet 0  . Rimegepant Sulfate (NURTEC) 75 MG TBDP Take 75 mg by mouth daily as needed (Migraine).     . rosuvastatin (CRESTOR) 10 MG tablet Take 10 mg by mouth daily.     . silver sulfADIAZINE (SILVADENE) 1 % cream Apply 1 application topically daily as needed (Open Wound).     . sodium chloride 0.9 % infusion Use as directed. (Patient not taking: Reported on 05/06/2020) 300 mL PRN  . sodium chloride flush 0.9 % SOLN injection Use two 10 mL sodium chloride flush 0.9% before and after fluids and medication administration, to maintain patency up to 4  times daily. 2400 mL 1  . sucralfate (CARAFATE) 1 g tablet Take 1 g by mouth daily as needed (FOR ULCERS).     . Marland Kitchenopiramate (TOPAMAX) 50 MG tablet Take 150 mg by mouth at bedtime.     . Marland Kitchenolpidem (AMBIEN CR) 12.5 MG CR tablet Take 12.5 mg by mouth at bedtime.       Current Facility-Administered Medications  Medication Dose Route Frequency Provider Last Rate Last Admin  . Mepolizumab SOLR 100 mg  100 mg Subcutaneous Q28 days ElMargaretha SeedsMD   100 mg at 08/31/19 1017    PHYSICAL EXAMINATION: ECOG PERFORMANCE  STATUS: 1 - Symptomatic but completely ambulatory  Vitals:   10/04/20 1605  BP: 111/72  Pulse: (!) 101  Resp: 18  Temp: 97.6 F (36.4 C)  SpO2: 100%   Filed Weights   10/04/20 1605  Weight: 193 lb 8 oz (87.8 kg)   Diffuse fluid retention face subcutaneous tissues extremities  LABORATORY DATA:  I have reviewed the data as listed CMP Latest Ref Rng & Units 10/04/2020 09/22/2020 07/15/2020  Glucose 70 - 99 mg/dL 116(H) 100(H) 120(H)  BUN 6 - 20 mg/dL 17 23(H) 20  Creatinine 0.44 - 1.00 mg/dL 1.25(H) 1.33(H) 1.22(H)  Sodium 135 - 145 mmol/L 141 141 138  Potassium 3.5 - 5.1 mmol/L 3.1(L) 3.3(L) 3.1(L)  Chloride 98 - 111 mmol/L 104 101 100  CO2 22 - 32 mmol/L _0 Calcium 8.9 - 10.3 mg/dL 9.4 9.6 9.0  Total Protein 6.5 - 8.1 g/dL 7.9 7.9 8.1  Total Bilirubin 0.3 - 1.2 mg/dL 0.3 0.3 0.4  Alkaline Phos 38 - 126 U/L 74 79 55  AST 15 - 41 U/L 26 32 33  ALT 0 - 44 U/L 42 52(H) 45(H)    Lab Results  Component Value Date   WBC 7.0 10/04/2020   HGB 11.9 (L) 10/04/2020   HCT 38.3 10/04/2020   MCV 85.7 10/04/2020   PLT 361 10/04/2020   NEUTROABS 3.1 10/04/2020    ASSESSMENT & PLAN:  History of ductal carcinoma in situ (DCIS) of breast  Fluid retention: I discontinued Lasix and switch her to Bumex 1 mg p.o. twice daily.  Instructed to take potassium 20 mEq daily.  Bright red blood per rectum Urgent visit today complaining of blood per rectum and low blood  pressures: Lab review: Hemoglobin is 11.9. Her blood pressure is stable and she is not tachycardic.  Therefore she does not need blood transfusion or emergency room visit. I instructed her to call her gastroenterologist and be seen by them.  Return to clinic in 3 months with labs and follow-up.   Orders Placed This Encounter  Procedures  . CBC with Differential (Cancer Center Only)    Standing Status:   Future    Standing Expiration Date:   10/04/2021  . CMP ([facility] only)    Standing Status:   Future    Standing Expiration Date:   10/04/2021   The patient has a good understanding of the overall plan. she agrees with it. she will call with any problems that may develop before the next visit here. Total time spent: 30 mins including face to face time and time spent for planning, charting and co-ordination of care   Harriette Ohara, MD 10/04/20

## 2020-10-05 ENCOUNTER — Telehealth: Payer: Self-pay | Admitting: Hematology and Oncology

## 2020-10-05 NOTE — Telephone Encounter (Signed)
Scheduled lab appointment per 1/4 los. Patient is aware.

## 2020-10-07 ENCOUNTER — Other Ambulatory Visit: Payer: Self-pay | Admitting: Hematology and Oncology

## 2020-10-13 ENCOUNTER — Other Ambulatory Visit: Payer: Self-pay | Admitting: Hematology and Oncology

## 2020-10-13 ENCOUNTER — Other Ambulatory Visit (HOSPITAL_COMMUNITY): Payer: Self-pay | Admitting: Hematology and Oncology

## 2020-10-13 MED FILL — NORMAL SALINE FLUSH SYRINGE: 0.9 | 14 days supply | Qty: 1200 | Fill #0

## 2020-10-20 ENCOUNTER — Other Ambulatory Visit: Payer: Self-pay | Admitting: Hematology and Oncology

## 2020-10-24 ENCOUNTER — Other Ambulatory Visit: Payer: Self-pay | Admitting: Hematology and Oncology

## 2020-10-24 MED FILL — NORMAL SALINE FLUSH SYRINGE: 0.9 | 14 days supply | Qty: 1200 | Fill #1

## 2020-10-25 ENCOUNTER — Encounter: Payer: Self-pay | Admitting: Hematology and Oncology

## 2020-10-25 ENCOUNTER — Inpatient Hospital Stay: Payer: BC Managed Care – PPO

## 2020-10-25 ENCOUNTER — Other Ambulatory Visit: Payer: Self-pay | Admitting: Hematology and Oncology

## 2020-10-25 MED FILL — HEPARIN SOD 100U/ML PFS: 100 | 30 days supply | Qty: 150 | Fill #0

## 2020-10-28 DIAGNOSIS — R911 Solitary pulmonary nodule: Secondary | ICD-10-CM | POA: Diagnosis not present

## 2020-11-08 ENCOUNTER — Other Ambulatory Visit: Payer: Self-pay | Admitting: Hematology and Oncology

## 2020-11-08 DIAGNOSIS — R110 Nausea: Secondary | ICD-10-CM

## 2020-11-11 ENCOUNTER — Encounter: Payer: Self-pay | Admitting: Hematology and Oncology

## 2020-11-14 NOTE — Telephone Encounter (Signed)
Myrtle, Can you call her PCP and have them return her calls. I don't want to get in the middle of adjusting her meds for depression and anxiety VG

## 2020-11-16 ENCOUNTER — Other Ambulatory Visit: Payer: Self-pay | Admitting: Hematology and Oncology

## 2020-11-16 ENCOUNTER — Other Ambulatory Visit: Payer: Self-pay | Admitting: *Deleted

## 2020-11-16 MED ORDER — NORMAL SALINE FLUSH 0.9 % IV SOLN: INTRAVENOUS | 1 refills | Status: DC

## 2020-11-16 MED FILL — NORMAL SALINE FLUSH SYRINGE: 0.9 | 14 days supply | Qty: 1200 | Fill #0

## 2020-11-18 MED FILL — HEPARIN LOCK FLUSH 100 UNIT: 100 | 30 days supply | Qty: 150 | Fill #0

## 2020-11-22 ENCOUNTER — Inpatient Hospital Stay: Payer: BC Managed Care – PPO

## 2020-11-23 ENCOUNTER — Telehealth: Payer: Self-pay | Admitting: Hematology and Oncology

## 2020-11-23 NOTE — Telephone Encounter (Signed)
Scheduled apt per 2/22 sch msg - unable to reach pt. Left message for pt with appt date and time

## 2020-11-28 DIAGNOSIS — R911 Solitary pulmonary nodule: Secondary | ICD-10-CM | POA: Diagnosis not present

## 2020-11-30 ENCOUNTER — Ambulatory Visit (HOSPITAL_COMMUNITY): Payer: Self-pay | Admitting: Student

## 2020-11-30 MED ORDER — SODIUM CHLORIDE 0.9 % IV SOLN
3.0000 g | INTRAVENOUS | Status: AC
  Filled 2020-11-30: qty 8

## 2020-11-30 NOTE — Progress Notes (Signed)
DUE TO COVID-19 ONLY ONE VISITOR IS ALLOWED TO COME WITH YOU AND STAY IN THE WAITING ROOM ONLY DURING PRE OP AND PROCEDURE DAY OF SURGERY. THE 1 VISITOR  MAY VISIT WITH YOU AFTER SURGERY IN YOUR PRIVATE ROOM DURING VISITING HOURS ONLY!  12/01/2020 ______ _0 , THIS TEST MUST BE DONE BEFORE SURGERY,  COVID TESTING SITE [address]  [id number], IT IS ON THE RIGHT GOING OUT [address] APPROXIMATELY  2 MINUTES PAST [employer] ON THE RIGHT. ONCE YOUR COVID TEST IS COMPLETED,  PLEASE BEGIN THE QUARANTINE INSTRUCTIONS AS OUTLINED IN YOUR HANDOUT.                [name]  11/30/2020   Your procedure is scheduled on: 12/05/2020   Report to [facility] Main  Entrance   Report to admitting at     215pm    Call this number if you have problems the morning of surgery [phone]    Remember: Do not eat food , candy gum or mints :After Midnight. You may have clear liquids from midnight until 115pm    CLEAR LIQUID DIET   Foods Allowed                                                                       Coffee and tea, regular and decaf                              Plain Jell-O any favor except red or purple                                            Fruit ices (not with fruit pulp)                                      Iced Popsicles                                     Carbonated beverages, regular and diet                                    Cranberry, grape and apple juices Sports drinks like Gatorade Lightly seasoned clear broth or consume(fat free) Sugar, honey syrup   _____________________________________________________________________    BRUSH YOUR TEETH MORNING OF SURGERY AND RINSE YOUR MOUTH OUT, NO CHEWING GUM CANDY OR MINTS.     Take these medicines the morning of surgery with A SIP OF WATER:       DO NOT TAKE ANY DIABETIC MEDICATIONS DAY OF YOUR SURGERY                               You may not have any metal on your body  including hair pins and  piercings  Do not wear jewelry, make-up, lotions, powders or perfumes, deodorant             Do not wear nail polish on your fingernails.  Do not shave  48 hours prior to surgery.              Men may shave face and neck.   Do not bring valuables to the hospital. [place].  Contacts, dentures or bridgework may not be worn into surgery.  Leave suitcase in the car. After surgery it may be brought to your room.     Patients discharged the day of surgery will not be allowed to drive home. IF YOU ARE HAVING SURGERY AND GOING HOME THE SAME DAY, YOU MUST HAVE AN ADULT TO DRIVE YOU HOME AND BE WITH YOU FOR 24 HOURS. YOU MAY GO HOME BY TAXI OR UBER OR ORTHERWISE, BUT AN ADULT MUST ACCOMPANY YOU HOME AND STAY WITH YOU FOR 24 HOURS.  Name and phone number of your driver:  Special Instructions: N/A              Please read over the following fact sheets you were given: _____________________________________________________________________  [facility] - Preparing for Surgery Before surgery, you can play an important role.  Because skin is not sterile, your skin needs to be as free of germs as possible.  You can reduce the number of germs on your skin by washing with CHG (chlorahexidine gluconate) soap before surgery.  CHG is an antiseptic cleaner which kills germs and bonds with the skin to continue killing germs even after washing. Please DO NOT use if you have an allergy to CHG or antibacterial soaps.  If your skin becomes reddened/irritated stop using the CHG and inform your nurse when you arrive at Short Stay. Do not shave (including legs and underarms) for at least 48 hours prior to the first CHG shower.  You may shave your face/neck. Please follow these instructions carefully:  1.  Shower with CHG Soap the night before surgery and the  morning of Surgery.  2.  If you choose to wash your hair, wash your hair first  as usual with your  normal  shampoo.  3.  After you shampoo, rinse your hair and body thoroughly to remove the  shampoo.                           4.  Use CHG as you would any other liquid soap.  You can apply chg directly  to the skin and wash                       Gently with a scrungie or clean washcloth.  5.  Apply the CHG Soap to your body ONLY FROM THE NECK DOWN.   Do not use on face/ open                           Wound or open sores. Avoid contact with eyes, ears mouth and genitals (private parts).                       Wash face,  Genitals (private parts) with your normal soap.             6.  Wash thoroughly, paying special attention to the area where your surgery  will be performed.  7.  Thoroughly rinse your body with warm water from the neck down.  8.  DO NOT shower/wash with your normal soap after using and rinsing off  the CHG Soap.                9.  Pat yourself dry with a clean towel.            10.  Wear clean pajamas.            11.  Place clean sheets on your bed the night of your first shower and do not  sleep with pets. Day of Surgery : Do not apply any lotions/deodorants the morning of surgery.  Please wear clean clothes to the hospital/surgery center.  FAILURE TO FOLLOW THESE INSTRUCTIONS MAY RESULT IN THE CANCELLATION OF YOUR SURGERY PATIENT SIGNATURE_________________________________  NURSE SIGNATURE__________________________________  ________________________________________________________________________

## 2020-11-30 NOTE — Progress Notes (Signed)
Called office of DR Dorise Bullion and requested orders placed in epic for preop on 12/01/2020 and order for port access to draw labs for surgery.  Also routed note to MD.

## 2020-11-30 NOTE — Progress Notes (Addendum)
Need orders in epic.  Preop on 12/01/20.  Also need order for port access for blood draw for preop labs for surgery.  Thank You.

## 2020-12-01 ENCOUNTER — Inpatient Hospital Stay (HOSPITAL_COMMUNITY)
Admission: RE | Admit: 2020-12-01 | Discharge: 2020-12-01 | Disposition: A | Discharge status: Home / Self Care | Payer: BC Managed Care – PPO | Source: Ambulatory Visit

## 2020-12-01 ENCOUNTER — Emergency Department (HOSPITAL_COMMUNITY): Payer: BC Managed Care – PPO

## 2020-12-01 ENCOUNTER — Emergency Department (HOSPITAL_COMMUNITY)
Admission: EM | Admit: 2020-12-01 | Discharge: 2020-12-01 | Disposition: A | Discharge status: Home / Self Care | Payer: BC Managed Care – PPO | Attending: Emergency Medicine | Admitting: Emergency Medicine

## 2020-12-01 ENCOUNTER — Encounter (HOSPITAL_COMMUNITY): Payer: Self-pay

## 2020-12-01 ENCOUNTER — Other Ambulatory Visit: Payer: Self-pay

## 2020-12-01 ENCOUNTER — Other Ambulatory Visit (HOSPITAL_COMMUNITY)
Admission: RE | Admit: 2020-12-01 | Discharge: 2020-12-01 | Disposition: A | Discharge status: Home / Self Care | Payer: BC Managed Care – PPO | Source: Ambulatory Visit | Attending: Student | Admitting: Student

## 2020-12-01 ENCOUNTER — Other Ambulatory Visit (HOSPITAL_COMMUNITY): Payer: BC Managed Care – PPO

## 2020-12-01 DIAGNOSIS — I110 Hypertensive heart disease with heart failure: Secondary | ICD-10-CM | POA: Insufficient documentation

## 2020-12-01 DIAGNOSIS — H579 Unspecified disorder of eye and adnexa: Secondary | ICD-10-CM | POA: Diagnosis not present

## 2020-12-01 DIAGNOSIS — Z20822 Contact with and (suspected) exposure to covid-19: Secondary | ICD-10-CM | POA: Insufficient documentation

## 2020-12-01 DIAGNOSIS — Z7982 Long term (current) use of aspirin: Secondary | ICD-10-CM | POA: Diagnosis not present

## 2020-12-01 DIAGNOSIS — G43109 Migraine with aura, not intractable, without status migrainosus: Secondary | ICD-10-CM | POA: Insufficient documentation

## 2020-12-01 DIAGNOSIS — R29810 Facial weakness: Secondary | ICD-10-CM | POA: Diagnosis not present

## 2020-12-01 DIAGNOSIS — Z8669 Personal history of other diseases of the nervous system and sense organs: Secondary | ICD-10-CM | POA: Insufficient documentation

## 2020-12-01 DIAGNOSIS — J4540 Moderate persistent asthma, uncomplicated: Secondary | ICD-10-CM | POA: Diagnosis not present

## 2020-12-01 DIAGNOSIS — R531 Weakness: Secondary | ICD-10-CM | POA: Diagnosis not present

## 2020-12-01 DIAGNOSIS — Z01812 Encounter for preprocedural laboratory examination: Secondary | ICD-10-CM | POA: Insufficient documentation

## 2020-12-01 DIAGNOSIS — Z79899 Other long term (current) drug therapy: Secondary | ICD-10-CM | POA: Diagnosis not present

## 2020-12-01 DIAGNOSIS — Z8541 Personal history of malignant neoplasm of cervix uteri: Secondary | ICD-10-CM | POA: Diagnosis not present

## 2020-12-01 DIAGNOSIS — Z7951 Long term (current) use of inhaled steroids: Secondary | ICD-10-CM | POA: Insufficient documentation

## 2020-12-01 DIAGNOSIS — E876 Hypokalemia: Secondary | ICD-10-CM | POA: Diagnosis not present

## 2020-12-01 DIAGNOSIS — G4489 Other headache syndrome: Secondary | ICD-10-CM | POA: Diagnosis not present

## 2020-12-01 DIAGNOSIS — I509 Heart failure, unspecified: Secondary | ICD-10-CM | POA: Diagnosis not present

## 2020-12-01 DIAGNOSIS — R519 Headache, unspecified: Secondary | ICD-10-CM | POA: Diagnosis not present

## 2020-12-01 HISTORY — DX: Sleep apnea, unspecified: G47.30

## 2020-12-01 HISTORY — DX: Unspecified convulsions: R56.9

## 2020-12-01 LAB — CBC WITH DIFFERENTIAL/PLATELET
Abs Immature Granulocytes: 0.02 10*3/uL (ref 0.00–0.07)
Basophils Absolute: 0.1 10*3/uL (ref 0.0–0.1)
Basophils Relative: 1 %
Eosinophils Absolute: 0.1 10*3/uL (ref 0.0–0.5)
Eosinophils Relative: 1 %
HCT: 38.4 % (ref 36.0–46.0)
Hemoglobin: 12.1 g/dL (ref 12.0–15.0)
Immature Granulocytes: 0 %
Lymphocytes Relative: 39 %
Lymphs Abs: 3.3 10*3/uL (ref 0.7–4.0)
MCH: 26.8 pg (ref 26.0–34.0)
MCHC: 31.5 g/dL (ref 30.0–36.0)
MCV: 85.1 fL (ref 80.0–100.0)
Monocytes Absolute: 0.8 10*3/uL (ref 0.1–1.0)
Monocytes Relative: 10 %
Neutro Abs: 4.1 10*3/uL (ref 1.7–7.7)
Neutrophils Relative %: 49 %
Platelets: 392 10*3/uL (ref 150–400)
RBC: 4.51 MIL/uL (ref 3.87–5.11)
RDW: 14.9 % (ref 11.5–15.5)
WBC: 8.4 10*3/uL (ref 4.0–10.5)
nRBC: 0 % (ref 0.0–0.2)

## 2020-12-01 LAB — BASIC METABOLIC PANEL
Anion gap: 12 (ref 5–15)
BUN: 20 mg/dL (ref 6–20)
CO2: 27 mmol/L (ref 22–32)
Calcium: 9.3 mg/dL (ref 8.9–10.3)
Chloride: 100 mmol/L (ref 98–111)
Creatinine, Ser: 1.21 mg/dL — ABNORMAL HIGH (ref 0.44–1.00)
GFR, Estimated: 55 mL/min — ABNORMAL LOW (ref >60)
Glucose, Bld: 93 mg/dL (ref 70–99)
Potassium: 3 mmol/L — ABNORMAL LOW (ref 3.5–5.1)
Sodium: 139 mmol/L (ref 135–145)

## 2020-12-01 MED ORDER — DIPHENHYDRAMINE HCL 50 MG/ML IJ SOLN
25.0000 mg | Freq: Once | INTRAMUSCULAR | Status: AC
  Administered 2020-12-01: 25 mg via INTRAVENOUS

## 2020-12-01 MED ORDER — KETOROLAC TROMETHAMINE 30 MG/ML IJ SOLN
30.0000 mg | Freq: Once | INTRAMUSCULAR | Status: AC
  Administered 2020-12-01: 30 mg via INTRAVENOUS
  Filled 2020-12-01: qty 1

## 2020-12-01 MED ORDER — SODIUM CHLORIDE 0.9 % IV BOLUS
500.0000 mL | Freq: Once | INTRAVENOUS | Status: AC
  Administered 2020-12-01: 500 mL via INTRAVENOUS

## 2020-12-01 MED ORDER — DIPHENHYDRAMINE HCL 50 MG/ML IJ SOLN
25.0000 mg | Freq: Once | INTRAMUSCULAR | Status: AC
  Administered 2020-12-01: 25 mg via INTRAVENOUS
  Filled 2020-12-01: qty 1

## 2020-12-01 NOTE — Progress Notes (Signed)
Medical hx and instructions completed via phone for surgery on 12/05/2020.

## 2020-12-01 NOTE — ED Triage Notes (Signed)
Patient BIB [facility] for c/o weakness, blurred vision in right eye and migraine.EMS reports VSS, extensive medical history.

## 2020-12-01 NOTE — ED Notes (Signed)
Patient's port deaccessed prior to discharge

## 2020-12-01 NOTE — Progress Notes (Addendum)
Anesthesia Review:  PCP: DR Toppin - [facility]- pt reports in process of locating PCP closer to home  Cardiologist : DR Nahser _ [place] 10/27/2018  DR Loanne Drilling- Pulm LOV 08/31/19 Onc- Dr Lindi Adie - [place] 10/04/20  Chest x-ray :04/26/20 EKG :04/01/20  Echo :04/28/20  Stress test: Cardiac Cath :  Activity level:  Sleep Study/ CPAP :In process of having a sleep study set up Fasting Blood Sugar :      / Checks Blood Sugar -- times a day:   Blood Thinner/ Instructions /Last Dose: ASA / Instructions/ Last Dose :  Oxygen- 3.5 L continuous for chronic resp failyre - pt has been on oxygen for 5 years  PORT  325 mg Aspirin- pt instructed to call DR [place] regarding preop instructions of Aspirin  PT with multiple allergies:  Pt has issues with N/V related to Anesthesia Pt reports combination that works si Emend, Benadryl IV, Scolpolamine patch, Propofol  Pt reports has to have a stress dose of Cortef prior to procedures of 144m  Pt takes Cortef 20 mg in the am  PT is a DNR per pt  Have called on 12/01/20 and requested H and P to be placed on chart.

## 2020-12-01 NOTE — Progress Notes (Signed)
Anesthesia Chart Review   Case: [id number] Date/Time: 12/05/20 1600   Procedure: DENTAL RESTORATION/EXTRACTIONS (N/A )   Anesthesia type: General   Pre-op diagnosis: DENTAL CARIES   Location: [facility] ROOM 08 / [facility]   Surgeons: Ronal Fear, MD      DISCUSSION:50 y.o. never smoker with h/o PONV, HTN, adrenal insufficiency on steroids (takes 20 mg cortef daily), sleep apnea, chronic hypoxemia w/use of 3L O2, CHF, migraines, Hodgkin lymphoma, breast cancer, thyroid cancer, dental caries scheduled for above procedure 12/05/2020 with Dr. Dorise Bullion.   Pt with history of multiple cancers, experiences diffuse fluid retention and lymphedema.  Taking Bumex 1 mg BID.   Pt last seen by cardiology 10/27/2018. Echo from December 2019 with EF 55%. Normal cardiac cath 04/02/2018.  Palpitations well controlled. Stable at this visit with no changes made to treatment plan.    Follows with pulmonology for moderate persistent asthma, chronic hypoxemia of unknown etiology.  Last seen by pulmonology 08/13/2019.  Pt uses 3L supplemental O2.   Needs H&P, Dr. Celine Ahr office made aware.  VS: LMP  (LMP Unknown)   PROVIDERS: Toppin, Antionette Poles, MD is PCP   Mertie Moores, MD is Cardiologist  LABS: labs DOS (all labs ordered are listed, but only abnormal results are displayed)  Labs Reviewed - No data to display   IMAGES:   EKG: 04/01/2020 Rate 89 bpm  Sinus rhythm Consider left atrial enlargement Borderline left axis deviation Abnormal R-wave progression, early transition Borderline prolonged QT interval No significant change since last tracing  CV: Echo 04/28/2020 IMPRESSIONS    1. Left ventricular ejection fraction, by estimation, is 60 to 65%. The  left ventricle has normal function. The left ventricle has no regional  wall motion abnormalities. There is mild left ventricular hypertrophy.  Left ventricular diastolic parameters  were normal.  2. Right ventricule is not  well-visualized, but grossly normal size and  systolic function. There is normal pulmonary artery systolic pressure.  3. The mitral valve is normal in structure. Trivial mitral valve  regurgitation.  4. Tricuspid valve regurgitation is mild to moderate.  5. The aortic valve was not well visualized. Moderately calcified. Aortic  valve regurgitation is mild. Mild to moderate aortic valve  sclerosis/calcification is present, without any evidence of aortic  stenosis.   Cardiac Cath 04/02/2018 1. No angiographic evidence of CAD 2. Right and left heart pressures are in a normal range.  3. Non-ischemic cardiomyopathy  Recommendations: Medical management of her non-ischemic cardiomyopathy  Past Medical History:  Diagnosis Date  . Addison's disease ([place])   . Adrenal insufficiency ([place])   . Anemia   . Anxiety   . Aortic stenosis   . Aortic stenosis   . Appendicitis 12/19/09  . Appendicitis   . Breast cancer ([place])    STATUS POST BILATERAL MASTECTOMY. STATUS POST RECONSTRUCTION. SHE HAD SILICONE BREAST IMPLANTS AND THE LEFT IMPLANT IS LEAKING SLIGHTLY  . Cellulitis of right middle finger 11/07/2018  . Cervical cancer ([place]) 12/23/2018  . Chest pain   . CHF with right heart failure ([place]) 04/17/2017  . Chronic respiratory failure with hypoxia ([place]) 12/23/2018  . Cough variant asthma 04/13/2019  . Depression   . GERD (gastroesophageal reflux disease)    takes Dexilant and carafate and gi coctail   . Headache    migraines on a daily and monthly regimen   . Heart murmur   . History of kidney stones   . Hodgkin lymphoma ([place])    STATUS POST MANTLE  RADIATION  . Hodgkin's lymphoma ([place])    1987  . Hypertension   . Hypoxia   . Necrotizing fasciitis ([place]) 12/23/2018  . Non-ischemic cardiomyopathy ([place])   . Osteoporosis   . Palpitations   . Pituitary adenoma ([place]) 12/23/2018  . Pneumonia   . PONV (postoperative nausea and vomiting)   . Pre-diabetes    per pt; no meds  . Pulmonary  hypertension ([place]) 12/23/2018  . Raynaud phenomenon   . Right heart failure ([place]) 04/17/2017  . Seizures ([place])    last febrile seizure was approx 3 weeks ago per report on 12/01/2020  . Sleep apnea    upcoming sleep study per pt   . Supplemental oxygen dependent    3 liters  . SVT (supraventricular tachycardia) ([place])   . Tachycardia   . Thyroid cancer ([place])    STATUS POST SURGICAL REMOVAL-CURRENT ON THYROID REPLACEMENT    Past Surgical History:  Procedure Laterality Date  . ABDOMINAL HYSTERECTOMY    . AMPUTATION Left 01/30/2019   Procedure: Left Index finger amputation with flap reconstruction and repair reconstruction;  Surgeon: Roseanne Kaufman, MD;  Location: [facility];  Service: Orthopedics;  Laterality: Left;  . APPENDECTOMY    . breast implants and removal     . breast implants but leaking     . CARDIAC CATHETERIZATION  05/18/09   NORMAL CATH  . COLONOSCOPY    . hx of chemotherapy     . hx of radiation therapy     . I & D EXTREMITY Left 12/23/2018   Procedure: IRRIGATION AND DEBRIDEMENT HAND / INDEX FINGER;  Surgeon: Roseanne Kaufman, MD;  Location: [facility];  Service: Orthopedics;  Laterality: Left;  . IR IMAGING GUIDED PORT INSERTION  05/06/2020  . IR REMOVAL TUN ACCESS W/ PORT W/O FL MOD SED  04/27/2020  . KIDNEY STONE SURGERY    . LUMBAR PUNCTURE W/ INTRATHECAL CHEMOTHERAPY    . MASTECTOMY    . PITUITARY SURGERY    . RIGHT/LEFT HEART CATH AND CORONARY ANGIOGRAPHY N/A 04/02/2018   Procedure: RIGHT/LEFT HEART CATH AND CORONARY ANGIOGRAPHY;  Surgeon: Burnell Blanks, MD;  Location: [facility];  Service: Cardiovascular;  Laterality: N/A;  . TOTAL THYROIDECTOMY    . VIDEO BRONCHOSCOPY Bilateral 11/14/2018   Procedure: VIDEO BRONCHOSCOPY WITHOUT FLUORO;  Surgeon: Margaretha Seeds, MD;  Location: [facility];  Service: Cardiopulmonary;  Laterality: Bilateral;  . VIDEO BRONCHOSCOPY WITH ENDOBRONCHIAL ULTRASOUND N/A 11/19/2018   Procedure: VIDEO BRONCHOSCOPY WITH ENDOBRONCHIAL  ULTRASOUND;  Surgeon: Margaretha Seeds, MD;  Location: [facility];  Service: Thoracic;  Laterality: N/A;    MEDICATIONS: . albuterol (2.5 MG/3ML) 0.083% NEBU 3 mL, albuterol (5 MG/ML) 0.5% NEBU 0.5 mL  . albuterol (VENTOLIN HFA) 108 (90 Base) MCG/ACT inhaler  . ALPRAZolam (XANAX) 0.5 MG tablet  . alum & mag hydroxide-simeth (MAALOX/MYLANTA) 200-200-20 MG/5ML suspension  . aspirin 325 MG tablet  . beclomethasone (QVAR) 80 MCG/ACT inhaler  . benzonatate (TESSALON) 200 MG capsule  . bumetanide (BUMEX) 1 MG tablet  . clonazePAM (KLONOPIN) 1 MG tablet  . colchicine 0.6 MG tablet  . dexlansoprazole (DEXILANT) 60 MG capsule  . diphenhydrAMINE (BENADRYL) 50 MG/ML injection  . dronabinol (MARINOL) 2.5 MG capsule  . EPINEPHRINE 0.3 mg/0.3 mL IJ SOAJ injection  . escitalopram (LEXAPRO) 20 MG tablet  . fluticasone furoate-vilanterol (BREO ELLIPTA) 200-25 MCG/INH AEPB  . Galcanezumab-gnlm 120 MG/ML SOAJ  . heparin lock flush 100 UNIT/ML SOLN injection  . HYDROcodone-acetaminophen (NORCO) 5-325 MG tablet  .  hydrocortisone (CORTEF) 10 MG tablet  . levothyroxine (SYNTHROID, LEVOTHROID) 112 MCG tablet  . LORazepam (ATIVAN) 0.5 MG tablet  . Mepolizumab (NUCALA) 100 MG SOLR  . metoprolol succinate (TOPROL-XL) 25 MG 24 hr tablet  . OXYGEN  . potassium chloride (KLOR-CON) 10 MEQ tablet  . Rimegepant Sulfate (NURTEC) 75 MG TBDP  . rosuvastatin (CRESTOR) 10 MG tablet  . silver sulfADIAZINE (SILVADENE) 1 % cream  . sodium chloride 0.9 % infusion  . Sodium Chloride Flush (NORMAL SALINE FLUSH) 0.9 % SOLN  . sucralfate (CARAFATE) 1 g tablet  . topiramate (TOPAMAX) 50 MG tablet  . zolpidem (AMBIEN CR) 12.5 MG CR tablet   . Mepolizumab SOLR 100 mg    Maia Plan [facility] Pre-Surgical Testing [phone]

## 2020-12-01 NOTE — ED Provider Notes (Signed)
[facility] DEPT Provider Note   CSN: [id number] Arrival date & time: 12/01/20  1715     History Chief Complaint  Patient presents with  . Migraine    C/o weakness and blurred vision in right eye    [name] is a 50 y.o. female.  HPI   50 year old female with extensive past medical history including HTN, HLD, Hodgkin's lymphoma undergoing treatment, kidney stones, hypothyroidism, seizure disorder and migraines presents the emergency department with concern for a migraine headache.  Patient states while she was driving her car she had a sharp right posterior headache that was associated with transient vision loss in her right eye.  She states the sharp pain went away immediately and the right eye vision loss self resolved followed by zigzag lines of vision.  Patient has had a similar episode about 10 years ago when she was originally diagnosed with migraines with visual aura.  She states she frequently gets migraines usually once a week, she does not always have the visual aura.  She is now complaining of a headache which she describes as her typical migraines.  She does follow with a neurologist.  Denies any associated symptoms including facial droop, speech change, eye pain, unilateral weakness/numbness.  She has no temporal pain, did not see any flashing lights in the eye.  Currently her vision is back to normal but she complains of an ongoing low-grade migraine.  No recent fever or illness.  Past Medical History:  Diagnosis Date  . Addison's disease ([place])   . Adrenal insufficiency ([place])   . Anemia   . Anxiety   . Aortic stenosis   . Aortic stenosis   . Appendicitis 12/19/09  . Appendicitis   . Breast cancer ([place])    STATUS POST BILATERAL MASTECTOMY. STATUS POST RECONSTRUCTION. SHE HAD SILICONE BREAST IMPLANTS AND THE LEFT IMPLANT IS LEAKING SLIGHTLY  . Cellulitis of right middle finger 11/07/2018  . Cervical cancer ([place]) 12/23/2018  . Chest pain    . CHF with right heart failure ([place]) 04/17/2017  . Chronic respiratory failure with hypoxia ([place]) 12/23/2018  . Cough variant asthma 04/13/2019  . Depression   . GERD (gastroesophageal reflux disease)    takes Dexilant and carafate and gi coctail   . Headache    migraines on a daily and monthly regimen   . Heart murmur   . History of kidney stones   . Hodgkin lymphoma ([facility])    STATUS POST MANTLE RADIATION  . Hodgkin's lymphoma ([place])    1987  . Hypertension   . Hypoxia   . Necrotizing fasciitis ([place]) 12/23/2018  . Non-ischemic cardiomyopathy ([place])   . Osteoporosis   . Palpitations   . Pituitary adenoma ([place]) 12/23/2018  . Pneumonia   . PONV (postoperative nausea and vomiting)   . Pre-diabetes    per pt; no meds  . Pulmonary hypertension ([place]) 12/23/2018  . Raynaud phenomenon   . Right heart failure ([place]) 04/17/2017  . Seizures ([place])    last febrile seizure was approx 3 weeks ago per report on 12/01/2020  . Sleep apnea    upcoming sleep study per pt   . Supplemental oxygen dependent    3 liters  . SVT (supraventricular tachycardia) ([place])   . Tachycardia   . Thyroid cancer ([place])    STATUS POST SURGICAL REMOVAL-CURRENT ON THYROID REPLACEMENT    Patient Active Problem List   Diagnosis Date Noted  . AKI (acute kidney injury) ([place]) 04/27/2020  .  Vasculopathy 04/27/2020  . Bacteremia 04/19/2020  . Addison's disease ([place])   . Nausea 04/01/2020  . Generalized weakness 02/10/2020  . Headache 02/10/2020  . Subcutaneous nodule 02/01/2020  . Port-A-Cath in place 09/09/2019  . Moderate persistent asthma without complication 93/57/0177  . Fever 07/21/2019  . Superficial thrombophlebitis 07/16/2019  . Injury of left index finger 12/24/2018  . History of pituitary adenoma 12/23/2018  . History of cervical cancer 12/23/2018  . Chronic respiratory failure with hypoxia ([place]) 12/23/2018  . SOB (shortness of breath)   . Nephrolithiasis 11/07/2018  . Oxygen dependent 11/07/2018  .  Migraine 11/07/2018  . PVC's (premature ventricular contractions) 10/27/2018  . Hyperlipidemia 03/09/2014  . Hx of Hodgkins lymphoma   . History of ductal carcinoma in situ (DCIS) of breast   . History of thyroid cancer   . Adrenal insufficiency ([place])   . Hypertension   . Raynaud phenomenon   . GERD (gastroesophageal reflux disease)     Past Surgical History:  Procedure Laterality Date  . ABDOMINAL HYSTERECTOMY    . AMPUTATION Left 01/30/2019   Procedure: Left Index finger amputation with flap reconstruction and repair reconstruction;  Surgeon: Roseanne Kaufman, MD;  Location: [facility];  Service: Orthopedics;  Laterality: Left;  . APPENDECTOMY    . breast implants and removal     . breast implants but leaking     . CARDIAC CATHETERIZATION  05/18/09   NORMAL CATH  . COLONOSCOPY    . hx of chemotherapy     . hx of radiation therapy     . I & D EXTREMITY Left 12/23/2018   Procedure: IRRIGATION AND DEBRIDEMENT HAND / INDEX FINGER;  Surgeon: Roseanne Kaufman, MD;  Location: [facility];  Service: Orthopedics;  Laterality: Left;  . IR IMAGING GUIDED PORT INSERTION  05/06/2020  . IR REMOVAL TUN ACCESS W/ PORT W/O FL MOD SED  04/27/2020  . KIDNEY STONE SURGERY    . LUMBAR PUNCTURE W/ INTRATHECAL CHEMOTHERAPY    . MASTECTOMY    . PITUITARY SURGERY    . RIGHT/LEFT HEART CATH AND CORONARY ANGIOGRAPHY N/A 04/02/2018   Procedure: RIGHT/LEFT HEART CATH AND CORONARY ANGIOGRAPHY;  Surgeon: Burnell Blanks, MD;  Location: [facility];  Service: Cardiovascular;  Laterality: N/A;  . TOTAL THYROIDECTOMY    . VIDEO BRONCHOSCOPY Bilateral 11/14/2018   Procedure: VIDEO BRONCHOSCOPY WITHOUT FLUORO;  Surgeon: Margaretha Seeds, MD;  Location: [facility];  Service: Cardiopulmonary;  Laterality: Bilateral;  . VIDEO BRONCHOSCOPY WITH ENDOBRONCHIAL ULTRASOUND N/A 11/19/2018   Procedure: VIDEO BRONCHOSCOPY WITH ENDOBRONCHIAL ULTRASOUND;  Surgeon: Margaretha Seeds, MD;  Location: [facility];  Service: Thoracic;   Laterality: N/A;     OB History   No obstetric history on file.     Family History  Family history unknown: Yes    Social History   Tobacco Use  . Smoking status: Never Smoker  . Smokeless tobacco: Never Used  Vaping Use  . Vaping Use: Never used  Substance Use Topics  . Alcohol use: Not Currently    Comment: social   . Drug use: No    Home Medications Prior to Admission medications   Medication Sig Start Date End Date Taking? Authorizing Provider  albuterol (2.5 MG/3ML) 0.083% NEBU 3 mL, albuterol (5 MG/ML) 0.5% NEBU 0.5 mL Inhale 3 mg into the lungs every 6 (six) hours as needed (SOB).     [provider]  albuterol (VENTOLIN HFA) 108 (90 Base) MCG/ACT inhaler Inhale 2 puffs into  the lungs every 6 (six) hours as needed for wheezing or shortness of breath.    [provider]  ALPRAZolam Duanne Moron) 0.5 MG tablet Take 0.5 mg by mouth 2 (two) times daily as needed for anxiety. 09/21/20   [provider]  alum & mag hydroxide-simeth (MAALOX/MYLANTA) 200-200-20 MG/5ML suspension Take 30 mLs by mouth daily as needed for indigestion or heartburn. For Bleeding Ulcers Patient not taking: No sig reported    [provider]  aspirin 325 MG tablet Take 325 mg by mouth at bedtime.     [provider]  beclomethasone (QVAR) 80 MCG/ACT inhaler Inhale 1 puff into the lungs 2 (two) times daily.     [provider]  benzonatate (TESSALON) 200 MG capsule Take 1 capsule (200 mg total) by mouth 3 (three) times daily as needed for cough. Patient not taking: Reported on 11/30/2020 04/15/19   Martyn Ehrich, NP  bumetanide (BUMEX) 1 MG tablet Take 1 tablet (1 mg total) by mouth 2 (two) times daily. 10/04/20   Nicholas Lose, MD  clonazePAM (KLONOPIN) 1 MG tablet Take 1 mg by mouth 2 (two) times daily.  Patient not taking: Reported on 11/30/2020    [provider]  colchicine 0.6 MG tablet Take 0.6 mg by mouth daily in the afternoon.  12/31/19    [provider]  dexlansoprazole (DEXILANT) 60 MG capsule Take 60 mg by mouth daily.    [provider]  diphenhydrAMINE (BENADRYL) 50 MG/ML injection Inject 1 mL (50 mg total) into the vein every 6 (six) hours as needed (nausea). Patient not taking: Reported on 11/30/2020 12/25/18   Rai, Vernelle Emerald, MD  dronabinol (MARINOL) 2.5 MG capsule TAKE 1 CAPSULE (2.5 MG TOTAL) BY MOUTH 2 (TWO) TIMES DAILY AS NEEDED (NAUSEA). 11/09/20   Nicholas Lose, MD  EPINEPHRINE 0.3 mg/0.3 mL IJ SOAJ injection INJECT 0.3 MLS (0.3 MG TOTAL) INTO THE MUSCLE AS NEEDED FOR ANAPHYLAXIS. Patient taking differently: Inject 0.3 mg into the muscle daily as needed for anaphylaxis. 12/09/19   Margaretha Seeds, MD  escitalopram (LEXAPRO) 20 MG tablet Take 20 mg by mouth every evening.  04/24/16   [provider]  fluticasone furoate-vilanterol (BREO ELLIPTA) 200-25 MCG/INH AEPB Inhale 1 puff into the lungs daily.    [provider]  Galcanezumab-gnlm 120 MG/ML SOAJ Inject 120 mg into the skin every 28 (twenty-eight) days.  03/31/19   [provider]  heparin lock flush 100 UNIT/ML SOLN injection USE 5 MLS IN PORT A CATH ONCE DAILY AFTER MEDICATION ADMINISTRATION AS A HEPLOCK AS DIRECTED. Patient taking differently: Inject 500 Units into the vein daily. 11/16/20   Nicholas Lose, MD  HYDROcodone-acetaminophen (NORCO) 5-325 MG tablet Take 1 tablet by mouth every 6 (six) hours as needed for moderate pain. Patient not taking: No sig reported 06/03/20   Harle Stanford., PA-C  hydrocortisone (CORTEF) 10 MG tablet Take 1-2 tablets (10-20 mg total) by mouth See admin instructions. Take 20 mg in the am and 55m in the evening Patient taking differently: Take 10-20 mg by mouth See admin instructions. Take 20 mg by mouth in the morning and 164min the evening 02/17/20   KyCherylann Ratel, DO  levothyroxine (SYNTHROID, LEVOTHROID) 112 MCG tablet Take 112 mcg by mouth daily before breakfast.    [provider]  LORazepam (ATIVAN) 0.5 MG tablet TAKE 1 TO 2 TABLETS BY MOUTH TWICE A DAY AS NEEDED FOR NAUSEA Patient taking differently: Take 0.5 mg by mouth  2 (two) times daily as needed (nausea). 11/09/20   N[place]olas Lose, MD  Mepolizumab (NUCALA) 100 MG SOLR Inject 100 mg into the skin every 28 (twenty-eight) days. Patient not taking: Reported on 11/30/2020 05/13/19   Margaretha Seeds, MD  metoprolol succinate (TOPROL-XL) 25 MG 24 hr tablet Take 37.5 mg by mouth daily.     [provider]  OXYGEN Inhale 3-4 L into the lungs continuous.    [provider]  potassium chloride (KLOR-CON) 10 MEQ tablet Take 1 tablet (10 mEq total) by mouth daily for 21 days. 07/15/20 08/05/20  Alfredia Client, PA-C  Rimegepant Sulfate (NURTEC) 75 MG TBDP Take 75 mg by mouth daily as needed (Migraine).     [provider]  rosuvastatin (CRESTOR) 10 MG tablet Take 10 mg by mouth daily.  02/28/18   [provider]  silver sulfADIAZINE (SILVADENE) 1 % cream Apply 1 application topically daily as needed (Open Wound).  11/16/19   [provider]  sodium chloride 0.9 % infusion Use as directed. 04/19/20   N[place]olas Lose, MD  Sodium Chloride Flush (NORMAL SALINE FLUSH) 0.9 % SOLN FLUSH PORT A CATH WITH 10 ML SODIUM CHLORIDE FLUSH 0.9% BEFORE AND AFTER FLUIDS TO MAINTAIN PATENCY 11/16/20   Nicholas Lose, MD  sucralfate (CARAFATE) 1 g tablet Take 1 g by mouth daily as needed (FOR ULCERS).     [provider]  topiramate (TOPAMAX) 50 MG tablet Take 150 mg by mouth at bedtime.  12/25/19   [provider]  zolpidem (AMBIEN CR) 12.5 MG CR tablet Take 12.5 mg by mouth at bedtime.    [provider]    Allergies    Ferrous bisglycinate chelate [iron], Mushroom extract complex, Na ferric gluc cplx in sucrose, Cymbalta [duloxetine hcl], Succinylcholine, Buprenorphine hcl, Compazine, Metoclopramide, Morphine and related, Ondansetron, Promethazine hcl, and Tegaderm ag mesh  [silver]  Review of Systems   Review of Systems  Constitutional: Negative for chills and fever.  HENT: Negative for congestion.   Eyes: Positive for visual disturbance. Negative for pain, discharge, redness and itching.  Respiratory: Negative for shortness of breath.   Cardiovascular: Negative for chest pain.  Gastrointestinal: Negative for abdominal pain, diarrhea and vomiting.  Genitourinary: Negative for dysuria.  Skin: Negative for rash.  Neurological: Positive for headaches. Negative for facial asymmetry, speech difficulty, weakness, light-headedness and numbness.    Physical Exam Updated Vital Signs BP (!) 148/96 (BP Location: Right Arm)   Pulse 88   Temp 98.1 F (36.7 C) (Oral)   Resp 18   Ht _0  (1.651 m)   Wt 86.2 kg   LMP  (LMP Unknown)   SpO2 97%   BMI 31.62 kg/m   Physical Exam Vitals and nursing note reviewed.  Constitutional:      Appearance: Normal appearance.  HENT:     Head: Normocephalic and atraumatic.     Comments: No temporal tenderness to palpation    Nose: Nose normal.     Mouth/Throat:     Mouth: Mucous membranes are moist.  Eyes:     General:        Right eye: No discharge.        Left eye: Discharge present.    Extraocular Movements: Extraocular movements intact.     Conjunctiva/sclera: Conjunctivae normal.     Pupils: Pupils are equal, round, and reactive to light.  Cardiovascular:     Rate and Rhythm: Normal rate.  Pulmonary:     Effort: Pulmonary effort is  normal. No respiratory distress.  Abdominal:     Palpations: Abdomen is soft.     Tenderness: There is no abdominal tenderness.  Musculoskeletal:     Cervical back: No rigidity.  Skin:    General: Skin is warm.  Neurological:     Mental Status: She is alert and oriented to person, place, and time. Mental status is at baseline.     Cranial Nerves: No cranial nerve deficit.     Sensory: No sensory deficit.     Motor: No weakness.  Psychiatric:     Comments: Very tearful,  crying throughout the entire interview     ED Results / Procedures / Treatments   Labs (all labs ordered are listed, but only abnormal results are displayed) Labs Reviewed  CBC WITH DIFFERENTIAL/PLATELET  BASIC METABOLIC PANEL    EKG None  Radiology No results found.  Procedures Procedures   Medications Ordered in ED Medications  sodium chloride 0.9 % bolus 500 mL (has no administration in time range)  ketorolac (TORADOL) 30 MG/ML injection 30 mg (has no administration in time range)  diphenhydrAMINE (BENADRYL) injection 25 mg (has no administration in time range)    ED Course  I have reviewed the triage vital signs and the nursing notes.  Pertinent labs & imaging results that were available during my care of the patient were reviewed by me and considered in my medical decision making (see chart for details).    MDM Rules/Calculators/A&P                          50 year old female presents the emergency department for evaluation of migraine.  Prior to arrival she had visual changes that she has had before that she has been diagnosed with visual migraine auras for.  Patient states the visual changes that she experienced earlier is identical to what she experienced when she was originally diagnosed with these visual auras.  I have lower suspicion for an ocular cause of transient vision loss/zigzag lines given her reported standing diagnosis of visual auras.  She has no eye pain or redness.  No temporal pain or tenderness to palpation, no suspicion for GCS.  She is now suffering from her typical migraine.  Blood work is reassuring with a baseline hypokalemia, she is being followed as an outpatient for this and takes supplements.  CT of the head shows no acute finding.  After treatment she states that the migraine has resolved.  She feels baseline.  She already has outpatient follow-up with neurology.  I have encouraged her to call her doctor tomorrow to have follow-up with this  repeat visual migraine aura.  Low suspicion for other acute pathology like stroke.  Patient will be discharged and treated as an outpatient.  Discharge plan and strict return to ED precautions discussed, patient verbalizes understanding and agreement.  Final Clinical Impression(s) / ED Diagnoses Final diagnoses:  None    Rx / DC Orders ED Discharge Orders    None       Lorelle Gibbs, DO 12/01/20 2209

## 2020-12-01 NOTE — Discharge Instructions (Addendum)
You have been seen and discharged from the emergency department.  Follow-up with your primary provider and neurologist for reevaluation. Take home medications as prescribed. If you have any worsening symptoms, severely worsening headache, other change in vision/neuro symptoms, or further concerns for health please return to an emergency department for further evaluation.

## 2020-12-01 NOTE — ED Notes (Addendum)
Patient stated that the blood pressure cuff is too tight and took it off

## 2020-12-01 NOTE — ED Notes (Signed)
Patient removed her pulse ox and BP cuff again after RN replaced it for the 2nd time

## 2020-12-02 LAB — SARS CORONAVIRUS 2 (TAT 6-24 HRS): SARS Coronavirus 2: NEGATIVE

## 2020-12-02 NOTE — H&P (Signed)
History and Physical  Date: 12/02/2020               Patient Name:  [name] MRN: [id number]  DOB: [birth year] Age / Sex: 50 y.o., female   PCP: Nicholas Lose, MD         Attending Physician: Dr. Ronal Fear,*     Chief Complaint: Pain in the lower right jaw  History of Present Illness: Patient presents referred from Dr. Axel Filler for extraction of tooth #31. The patient reports approximately 1 week of pain. She has not taken any antibiotics prior to this point as she was told that the area should be evaluated prior to antibiotics. She reports that the pain extends form her right jaw line to the cheek bone and has impacted her ability to open her mouth. The patient has severe dental anxiety and will not tolerate any procedure awake. Of note the patient has a history of radiation therapy in the 1980's at [facility] for hodgkins lymphoma. She does not have any records of the amount of radiation or the radiation field, but she does report having extreme dry mouth. The patient also has a history of thyroid cancer for which she has taken exogenous steroids (Cortef). She reports having to take stress dose steroids for any procedures in the past. She denies fevers, chills, N/V or other constitutional symptoms suggestive of worsening spread of an infection.   Meds: Current Facility-Administered Medications  Medication Dose Route Frequency Provider Last Rate Last Admin  . Mepolizumab SOLR 100 mg  100 mg Subcutaneous Q28 days Margaretha Seeds, MD   100 mg at 08/31/19 1017   Current Outpatient Medications  Medication Sig Dispense Refill  . albuterol (2.5 MG/3ML) 0.083% NEBU 3 mL, albuterol (5 MG/ML) 0.5% NEBU 0.5 mL Inhale 3 mg into the lungs every 6 (six) hours as needed (SOB).     Marland Kitchen albuterol (VENTOLIN HFA) 108 (90 Base) MCG/ACT inhaler Inhale 2 puffs into the lungs every 6 (six) hours as needed for wheezing or shortness of breath.    . ALPRAZolam (XANAX) 0.5 MG tablet Take 0.5 mg  by mouth 2 (two) times daily as needed for anxiety.    Marland Kitchen aspirin 325 MG tablet Take 325 mg by mouth at bedtime.     . beclomethasone (QVAR) 80 MCG/ACT inhaler Inhale 1 puff into the lungs 2 (two) times daily.     . bumetanide (BUMEX) 1 MG tablet Take 1 tablet (1 mg total) by mouth 2 (two) times daily. 60 tablet 3  . colchicine 0.6 MG tablet Take 0.6 mg by mouth daily in the afternoon.     Marland Kitchen dexlansoprazole (DEXILANT) 60 MG capsule Take 60 mg by mouth daily.    Marland Kitchen dronabinol (MARINOL) 2.5 MG capsule TAKE 1 CAPSULE (2.5 MG TOTAL) BY MOUTH 2 (TWO) TIMES DAILY AS NEEDED (NAUSEA). 30 capsule 0  . EPINEPHRINE 0.3 mg/0.3 mL IJ SOAJ injection INJECT 0.3 MLS (0.3 MG TOTAL) INTO THE MUSCLE AS NEEDED FOR ANAPHYLAXIS. (Patient taking differently: Inject 0.3 mg into the muscle daily as needed for anaphylaxis.) 2 each 3  . escitalopram (LEXAPRO) 20 MG tablet Take 20 mg by mouth every evening.   1  . fluticasone furoate-vilanterol (BREO ELLIPTA) 200-25 MCG/INH AEPB Inhale 1 puff into the lungs daily.    . Galcanezumab-gnlm 120 MG/ML SOAJ Inject 120 mg into the skin every 28 (twenty-eight) days.     . heparin lock flush 100 UNIT/ML SOLN injection USE 5 MLS IN PORT  A CATH ONCE DAILY AFTER MEDICATION ADMINISTRATION AS A HEPLOCK AS DIRECTED. (Patient taking differently: Inject 500 Units into the vein daily.) 150 mL 1  . hydrocortisone (CORTEF) 10 MG tablet Take 1-2 tablets (10-20 mg total) by mouth See admin instructions. Take 20 mg in the am and 38m in the evening (Patient taking differently: Take 10-20 mg by mouth See admin instructions. Take 20 mg by mouth in the morning and 161min the evening)    . levothyroxine (SYNTHROID, LEVOTHROID) 112 MCG tablet Take 112 mcg by mouth daily before breakfast.    . LORazepam (ATIVAN) 0.5 MG tablet TAKE 1 TO 2 TABLETS BY MOUTH TWICE A DAY AS NEEDED FOR NAUSEA (Patient taking differently: Take 0.5 mg by mouth 2 (two) times daily as needed (nausea).) 30 tablet 2  . metoprolol  succinate (TOPROL-XL) 25 MG 24 hr tablet Take 37.5 mg by mouth daily.     . OXYGEN Inhale 3-4 L into the lungs continuous.    . potassium chloride (KLOR-CON) 10 MEQ tablet Take 1 tablet (10 mEq total) by mouth daily for 21 days. 21 tablet 0  . Rimegepant Sulfate (NURTEC) 75 MG TBDP Take 75 mg by mouth daily as needed (Migraine).     . rosuvastatin (CRESTOR) 10 MG tablet Take 10 mg by mouth daily.     . silver sulfADIAZINE (SILVADENE) 1 % cream Apply 1 application topically daily as needed (Open Wound).     . Sodium Chloride Flush (NORMAL SALINE FLUSH) 0.9 % SOLN FLUSH PORT A CATH WITH 10 ML SODIUM CHLORIDE FLUSH 0.9% BEFORE AND AFTER FLUIDS TO MAINTAIN PATENCY 1200 mL 1  . sucralfate (CARAFATE) 1 g tablet Take 1 g by mouth daily as needed (FOR ULCERS).     . Marland Kitchenopiramate (TOPAMAX) 50 MG tablet Take 150 mg by mouth at bedtime.     . Marland Kitchenolpidem (AMBIEN CR) 12.5 MG CR tablet Take 12.5 mg by mouth at bedtime.    . Marland Kitchenlum & mag hydroxide-simeth (MAALOX/MYLANTA) 200-200-20 MG/5ML suspension Take 30 mLs by mouth daily as needed for indigestion or heartburn. For Bleeding Ulcers    . benzonatate (TESSALON) 200 MG capsule Take 1 capsule (200 mg total) by mouth 3 (three) times daily as needed for cough. (Patient not taking: No sig reported) 45 capsule 1  . diphenhydrAMINE (BENADRYL) 50 MG/ML injection Inject 1 mL (50 mg total) into the vein every 6 (six) hours as needed (nausea). (Patient not taking: No sig reported) 25 mL 0  . HYDROcodone-acetaminophen (NORCO) 5-325 MG tablet Take 1 tablet by mouth every 6 (six) hours as needed for moderate pain. (Patient not taking: No sig reported) 30 tablet 0  . Mepolizumab (NUCALA) 100 MG SOLR Inject 100 mg into the skin every 28 (twenty-eight) days. (Patient not taking: No sig reported) 1 each 3  . sodium chloride 0.9 % infusion Use as directed. 300 mL PRN    Allergies: Allergies as of 11/30/2020 - Review Complete 11/30/2020  Allergen Reaction Noted  . Ferrous bisglycinate  chelate [iron] Anaphylaxis 07/15/2020  . Mushroom extract complex Anaphylaxis 04/23/2019  . Na ferric gluc cplx in sucrose Anaphylaxis 11/16/2014  . Cymbalta [duloxetine hcl] Swelling and Anxiety 02/06/2011  . Succinylcholine Other (See Comments) 12/23/2018  . Buprenorphine hcl Hives 02/03/2015  . Compazine Other (See Comments) 02/06/2011  . Metoclopramide Other (See Comments) 05/08/2016  . Morphine and related Hives 02/06/2011  . Ondansetron Hives and Rash 02/03/2015  . Promethazine hcl Hives 02/06/2011  . Tegaderm ag mesh [silver] Rash  02/06/2011   Past Medical History:  Diagnosis Date  . Addison's disease ([place])   . Adrenal insufficiency ([place])   . Anemia   . Anxiety   . Aortic stenosis   . Aortic stenosis   . Appendicitis 12/19/09  . Appendicitis   . Breast cancer ([place])    STATUS POST BILATERAL MASTECTOMY. STATUS POST RECONSTRUCTION. SHE HAD SILICONE BREAST IMPLANTS AND THE LEFT IMPLANT IS LEAKING SLIGHTLY  . Cellulitis of right middle finger 11/07/2018  . Cervical cancer ([place]) 12/23/2018  . Chest pain   . CHF with right heart failure ([place]) 04/17/2017  . Chronic respiratory failure with hypoxia ([place]) 12/23/2018  . Cough variant asthma 04/13/2019  . Depression   . GERD (gastroesophageal reflux disease)    takes Dexilant and carafate and gi coctail   . Headache    migraines on a daily and monthly regimen   . Heart murmur   . History of kidney stones   . Hodgkin lymphoma (HCC)    STATUS POST MANTLE RADIATION  . Hodgkin's lymphoma ([place])    1987  . Hypertension   . Hypoxia   . Necrotizing fasciitis ([place]) 12/23/2018  . Non-ischemic cardiomyopathy ([place])   . Osteoporosis   . Palpitations   . Pituitary adenoma ([place]) 12/23/2018  . Pneumonia   . PONV (postoperative nausea and vomiting)   . Pre-diabetes    per pt; no meds  . Pulmonary hypertension ([place]) 12/23/2018  . Raynaud phenomenon   . Right heart failure ([place]) 04/17/2017  . Seizures ([place])    last febrile seizure was approx 3  weeks ago per report on 12/01/2020  . Sleep apnea    upcoming sleep study per pt   . Supplemental oxygen dependent    3 liters  . SVT (supraventricular tachycardia) ([place])   . Tachycardia   . Thyroid cancer ([place])    STATUS POST SURGICAL REMOVAL-CURRENT ON THYROID REPLACEMENT   Past Surgical History:  Procedure Laterality Date  . ABDOMINAL HYSTERECTOMY    . AMPUTATION Left 01/30/2019   Procedure: Left Index finger amputation with flap reconstruction and repair reconstruction;  Surgeon: Roseanne Kaufman, MD;  Location: [facility];  Service: Orthopedics;  Laterality: Left;  . APPENDECTOMY    . breast implants and removal     . breast implants but leaking     . CARDIAC CATHETERIZATION  05/18/09   NORMAL CATH  . COLONOSCOPY    . hx of chemotherapy     . hx of radiation therapy     . I & D EXTREMITY Left 12/23/2018   Procedure: IRRIGATION AND DEBRIDEMENT HAND / INDEX FINGER;  Surgeon: Roseanne Kaufman, MD;  Location: [facility];  Service: Orthopedics;  Laterality: Left;  . IR IMAGING GUIDED PORT INSERTION  05/06/2020  . IR REMOVAL TUN ACCESS W/ PORT W/O FL MOD SED  04/27/2020  . KIDNEY STONE SURGERY    . LUMBAR PUNCTURE W/ INTRATHECAL CHEMOTHERAPY    . MASTECTOMY    . PITUITARY SURGERY    . RIGHT/LEFT HEART CATH AND CORONARY ANGIOGRAPHY N/A 04/02/2018   Procedure: RIGHT/LEFT HEART CATH AND CORONARY ANGIOGRAPHY;  Surgeon: Burnell Blanks, MD;  Location: [facility];  Service: Cardiovascular;  Laterality: N/A;  . TOTAL THYROIDECTOMY    . VIDEO BRONCHOSCOPY Bilateral 11/14/2018   Procedure: VIDEO BRONCHOSCOPY WITHOUT FLUORO;  Surgeon: Margaretha Seeds, MD;  Location: [facility];  Service: Cardiopulmonary;  Laterality: Bilateral;  . VIDEO BRONCHOSCOPY WITH ENDOBRONCHIAL ULTRASOUND N/A 11/19/2018   Procedure: VIDEO BRONCHOSCOPY  WITH ENDOBRONCHIAL ULTRASOUND;  Surgeon: Margaretha Seeds, MD;  Location: [facility];  Service: Thoracic;  Laterality: N/A;   Family History  Family history unknown: Yes    Social History   Socioeconomic History  . Marital status: Married    Spouse name: Not on file  . Number of children: Not on file  . Years of education: Not on file  . Highest education level: Not on file  Occupational History  . Not on file  Tobacco Use  . Smoking status: Never Smoker  . Smokeless tobacco: Never Used  Vaping Use  . Vaping Use: Never used  Substance and Sexual Activity  . Alcohol use: Not Currently    Comment: social   . Drug use: No  . Sexual activity: Yes    Birth control/protection: Surgical  Other Topics Concern  . Not on file  Social History Narrative  . Not on file   Social Determinants of Health   Financial Resource Strain: Not on file  Food Insecurity: Not on file  Transportation Needs: Not on file  Physical Activity: Not on file  Stress: Not on file  Social Connections: Not on file  Intimate Partner Violence: Not on file    Review of Systems: Pertinent items are noted in HPI.  Physical Exam: Vital Signs:  Ht: 5'5" Wt" 182 lbs BP: 118/75 HR: 95  Neuro: CN II-XII grossly intact  HEENT: No obvious swelling extraorally, tenderness to palpation along the right inferior border with mild edema on the lateral aspect, Intraorally, MIO>72m, Tooth #31 appears to be partially fractured, but without frank purulence noted.  CV: RRR Pulm: NWB, not wearing oxygen- reports baseline O2 with 4L oxygen is 97%, but she doesn't wear her oxygen regularly. Without oxygen her O2 is ~85-90%.  Abdomen: Non-distended, Port in the left chest Extremities: Normal range of motion  Lab results: Recent Results (from the past 2160 hour(s))  Vitamin D 25 hydroxy     Status: Abnormal   Collection Time: 09/22/20  9:35 AM  Result Value Ref Range   Vit D, 25-Hydroxy 27.78 (L) 30 - 100 ng/mL    Comment: (NOTE) Vitamin D deficiency has been defined by the Institute of Medicine  and an Endocrine Society practice guideline as a level of serum 25-OH  vitamin D less  than 20 ng/mL (1,2). The Endocrine Society went on to  further define vitamin D insufficiency as a level between 21 and 29  ng/mL (2).  1. IOM (Institute of Medicine). 2010. Dietary reference intakes for  calcium and D. [place] [employer] 2. Holick MF, Binkley Selma, Bischoff-Ferrari HA, et al. Evaluation,  treatment, and prevention of vitamin D deficiency: an Endocrine  Society clinical practice guideline, JCEM. 2011 Jul; 96(7): 1911-30.  Performed at [facility] [address] [place] [address]  T4, free     Status: Abnormal   Collection Time: 09/22/20  9:35 AM  Result Value Ref Range   Free T4 1.18 (H) 0.61 - 1.12 ng/dL    Comment: (NOTE) Biotin ingestion may interfere with free T4 tests. If the results are inconsistent with the TSH level, previous test results, or the clinical presentation, then consider biotin interference. If needed, order repeat testing after stopping biotin. Performed at [facility] [address], [place] [address]  Hemoglobin A1c     Status: Abnormal   Collection Time: 09/22/20  9:35 AM  Result Value Ref Range   Hgb  A1c MFr Bld 6.3 (H) 4.8 - 5.6 %    Comment: (NOTE) Pre diabetes:          5.7%-6.4%  Diabetes:              >6.4%  Glycemic control for   <7.0% adults with diabetes    Mean Plasma Glucose 134.11 mg/dL    Comment: Performed at [facility], [place] [address], [place], [place] [address]  Lipid panel     Status: Abnormal   Collection Time: 09/22/20  9:35 AM  Result Value Ref Range   Cholesterol 178 0 - 200 mg/dL   Triglycerides 253 (H) <150 mg/dL   HDL 44 >40 mg/dL   Total CHOL/HDL Ratio 4.0 RATIO   VLDL 51 (H) 0 - 40 mg/dL   LDL Cholesterol 83 0 - 99 mg/dL    Comment:        Total Cholesterol/HDL:CHD Risk Coronary Heart Disease Risk Table                     Men   Women  1/2 Average Risk   3.4   3.3  Average Risk       5.0   4.4  2 X Average Risk   9.6   7.1  3 X  Average Risk  23.4   11.0        Use the calculated Patient Ratio above and the CHD Risk Table to determine the patient's CHD Risk.        ATP III CLASSIFICATION (LDL):  <100     mg/dL   Optimal  100-129  mg/dL   Near or Above                    Optimal  130-159  mg/dL   Borderline  160-189  mg/dL   High  >190     mg/dL   Very High Performed at [place] [address] [place], [place] [address]   TSH     Status: Abnormal   Collection Time: 09/22/20  9:35 AM  Result Value Ref Range   TSH <0.080 (L) 0.308 - 3.960 uIU/mL    Comment: Performed at [facility], [place] [address], [place], [place] [address]  Vitamin B12     Status: None   Collection Time: 09/22/20  9:35 AM  Result Value Ref Range   Vitamin B-12 409 180 - 914 pg/mL    Comment: (NOTE) This assay is not validated for testing neonatal or myeloproliferative syndrome specimens for Vitamin B12 levels. Performed at [facility], [place] [address] [place], [place] [address]   CMP ([place] only)     Status: Abnormal   Collection Time: 09/22/20  9:35 AM  Result Value Ref Range   Sodium 141 135 - 145 mmol/L   Potassium 3.3 (L) 3.5 - 5.1 mmol/L   Chloride 101 98 - 111 mmol/L   CO2 29 22 - 32 mmol/L   Glucose, Bld 100 (H) 70 - 99 mg/dL    Comment: Glucose reference range applies only to samples taken after fasting for at least 8 hours.   BUN 23 (H) 6 - 20 mg/dL   Creatinine 1.33 (H) 0.44 - 1.00 mg/dL   Calcium 9.6 8.9 - 10.3 mg/dL   Total Protein 7.9 6.5 - 8.1 g/dL   Albumin 3.9 3.5 - 5.0 g/dL   AST 32 15 - 41 U/L   ALT  52 (H) 0 - 44 U/L   Alkaline Phosphatase 79 38 - 126 U/L   Total Bilirubin 0.3 0.3 - 1.2 mg/dL   GFR, Estimated 49 (L) >60 mL/min    Comment: (NOTE) Calculated using the CKD-EPI Creatinine Equation (2021)    Anion gap 11 5 - 15    Comment: Performed at [facility], [place] [address] [place], [place] [address]   CBC with Differential ([place] Only)     Status: None   Collection Time: 09/22/20  9:35 AM  Result Value Ref Range   WBC Count 8.5 4.0 - 10.5 K/uL   RBC 4.48 3.87 - 5.11 MIL/uL   Hemoglobin 12.1 12.0 - 15.0 g/dL   HCT 38.8 36.0 - 46.0 %   MCV 86.6 80.0 - 100.0 fL   MCH 27.0 26.0 - 34.0 pg   MCHC 31.2 30.0 - 36.0 g/dL   RDW 14.6 11.5 - 15.5 %   Platelet Count 356 150 - 400 K/uL   nRBC 0.0 0.0 - 0.2 %   Neutrophils Relative % 44 %   Neutro Abs 3.8 1.7 - 7.7 K/uL   Lymphocytes Relative 41 %   Lymphs Abs 3.5 0.7 - 4.0 K/uL   Monocytes Relative 11 %   Monocytes Absolute 0.9 0.1 - 1.0 K/uL   Eosinophils Relative 3 %   Eosinophils Absolute 0.2 0.0 - 0.5 K/uL   Basophils Relative 1 %   Basophils Absolute 0.1 0.0 - 0.1 K/uL   Immature Granulocytes 0 %   Abs Immature Granulocytes 0.02 0.00 - 0.07 K/uL    Comment: Performed at [facility], [place] [address] [place], [place] [address]  ABO/RH     Status: None   Collection Time: 10/04/20  3:09 PM  Result Value Ref Range   ABO/RH(D)      Jenetta Downer POS Performed at [facility], [place] [address], [place], [place] [address]   Sample to Blood Bank     Status: None   Collection Time: 10/04/20  3:27 PM  Result Value Ref Range   Blood Bank Specimen SAMPLE AVAILABLE FOR TESTING    Sample Expiration      10/07/2020,2359 Performed at [facility], [place] [address], [place], [place] [address]   CMP ([place] only)     Status: Abnormal   Collection Time: 10/04/20  3:27 PM  Result Value Ref Range   Sodium 141 135 - 145 mmol/L   Potassium 3.1 (L) 3.5 - 5.1 mmol/L   Chloride 104 98 - 111 mmol/L   CO2 28 22 - 32 mmol/L   Glucose, Bld 116 (H) 70 - 99 mg/dL    Comment: Glucose reference range applies only to samples taken after fasting for at least 8 hours.   BUN 17 6 - 20 mg/dL   Creatinine 1.25 (H) 0.44 - 1.00 mg/dL   Calcium 9.4 8.9 - 10.3 mg/dL   Total Protein 7.9 6.5 - 8.1 g/dL    Albumin 3.8 3.5 - 5.0 g/dL   AST 26 15 - 41 U/L   ALT 42 0 - 44 U/L   Alkaline Phosphatase 74 38 - 126 U/L   Total Bilirubin 0.3 0.3 - 1.2 mg/dL   GFR, Estimated 53 (L) >60 mL/min    Comment: (NOTE) Calculated using the CKD-EPI Creatinine Equation (2021)    Anion gap 9 5 - 15    Comment: Performed at [facility], [place] [address]  [place], [place] [address]  CBC with Differential ([place] Only)     Status: Abnormal   Collection Time: 10/04/20  3:27 PM  Result Value Ref Range   WBC Count 7.0 4.0 - 10.5 K/uL   RBC 4.47 3.87 - 5.11 MIL/uL   Hemoglobin 11.9 (L) 12.0 - 15.0 g/dL   HCT 38.3 36.0 - 46.0 %   MCV 85.7 80.0 - 100.0 fL   MCH 26.6 26.0 - 34.0 pg   MCHC 31.1 30.0 - 36.0 g/dL   RDW 14.6 11.5 - 15.5 %   Platelet Count 361 150 - 400 K/uL   nRBC 0.0 0.0 - 0.2 %   Neutrophils Relative % 44 %   Neutro Abs 3.1 1.7 - 7.7 K/uL   Lymphocytes Relative 39 %   Lymphs Abs 2.7 0.7 - 4.0 K/uL   Monocytes Relative 13 %   Monocytes Absolute 0.9 0.1 - 1.0 K/uL   Eosinophils Relative 3 %   Eosinophils Absolute 0.2 0.0 - 0.5 K/uL   Basophils Relative 1 %   Basophils Absolute 0.1 0.0 - 0.1 K/uL   Immature Granulocytes 0 %   Abs Immature Granulocytes 0.01 0.00 - 0.07 K/uL    Comment: Performed at [facility], [place] [address], [place], [place] [address]  Vitamin B12     Status: None   Collection Time: 10/04/20  3:27 PM  Result Value Ref Range   Vitamin B-12 400 180 - 914 pg/mL    Comment: (NOTE) This assay is not validated for testing neonatal or myeloproliferative syndrome specimens for Vitamin B12 levels. Performed at [facility], [place] [address] [place], [place] [address]   SARS CORONAVIRUS 2 (TAT 6-24 HRS) Nasopharyngeal Nasopharyngeal Swab     Status: None   Collection Time: 12/01/20  2:53 PM   Specimen: Nasopharyngeal Swab  Result Value Ref Range   SARS Coronavirus 2 NEGATIVE NEGATIVE    Comment:  (NOTE) SARS-CoV-2 target nucleic acids are NOT DETECTED.  The SARS-CoV-2 RNA is generally detectable in upper and lower respiratory specimens during the acute phase of infection. Negative results do not preclude SARS-CoV-2 infection, do not rule out co-infections with other pathogens, and should not be used as the sole basis for treatment or other patient management decisions. Negative results must be combined with clinical observations, patient history, and epidemiological information. The expected result is Negative.  Fact Sheet for Patients: SugarRoll.be  Fact Sheet for Healthcare Providers: https://www.woods-mathews.com/  This test is not yet approved or cleared by the [place] FDA and  has been authorized for detection and/or diagnosis of SARS-CoV-2 by FDA under an Emergency Use Authorization (EUA). This EUA will remain  in effect (meaning this test can be used) for the duration of the COVID-19 declaration under Se ction 564(b)(1) of the Act, 21 U.S.C. section 360bbb-3(b)(1), unless the authorization is terminated or revoked sooner.  Performed at [facility], [place] [address] [place], [place] [address]   CBC with Differential     Status: None   Collection Time: 12/01/20  7:15 PM  Result Value Ref Range   WBC 8.4 4.0 - 10.5 K/uL   RBC 4.51 3.87 - 5.11 MIL/uL   Hemoglobin 12.1 12.0 - 15.0 g/dL   HCT 38.4 36.0 - 46.0 %   MCV 85.1 80.0 - 100.0 fL   MCH 26.8 26.0 - 34.0 pg   MCHC 31.5 30.0 - 36.0 g/dL   RDW 14.9 11.5 - 15.5 %   Platelets  392 150 - 400 K/uL   nRBC 0.0 0.0 - 0.2 %   Neutrophils Relative % 49 %   Neutro Abs 4.1 1.7 - 7.7 K/uL   Lymphocytes Relative 39 %   Lymphs Abs 3.3 0.7 - 4.0 K/uL   Monocytes Relative 10 %   Monocytes Absolute 0.8 0.1 - 1.0 K/uL   Eosinophils Relative 1 %   Eosinophils Absolute 0.1 0.0 - 0.5 K/uL   Basophils Relative 1 %   Basophils Absolute 0.1 0.0 - 0.1 K/uL   Immature  Granulocytes 0 %   Abs Immature Granulocytes 0.02 0.00 - 0.07 K/uL    Comment: Performed at [facility], [place] [address], [place], [place] [address]  Basic metabolic panel     Status: Abnormal   Collection Time: 12/01/20  7:15 PM  Result Value Ref Range   Sodium 139 135 - 145 mmol/L   Potassium 3.0 (L) 3.5 - 5.1 mmol/L   Chloride 100 98 - 111 mmol/L   CO2 27 22 - 32 mmol/L   Glucose, Bld 93 70 - 99 mg/dL    Comment: Glucose reference range applies only to samples taken after fasting for at least 8 hours.   BUN 20 6 - 20 mg/dL   Creatinine, Ser 1.21 (H) 0.44 - 1.00 mg/dL   Calcium 9.3 8.9 - 10.3 mg/dL   GFR, Estimated 55 (L) >60 mL/min    Comment: (NOTE) Calculated using the CKD-EPI Creatinine Equation (2021)    Anion gap 12 5 - 15    Comment: Performed at [facility], [place] [address], [place], [place] [address]    Imaging results:  Panoramic Radiograph- 3D Reconstruction: Condyles are seated appropriately, Missing 1, 2, 16, 17, 32. Tooth #31 with restoration that appears partially fractured. No bony lesions identified.   Assessment & Plan by Problem: Patient will require extraction of tooth #31 with possible incision and drainage. Given anxiety and complicated past medical history, the procedure will need to be performed in a hospital setting under anesthetic care. I have also discussed with the patient the risk of ORN given history of radiation and unknown dose or locations. We will attempt obtain primary closure of the site, but she may require HBO therapy in the future should she develop ORN. The patient voiced understanding of this as well as interest in pursuing treatment given her pain.   - Will require stress dose steroids - Will require procedure in Hospital setting given complex medical history and ASA IV - No paralytics required - ET tube preferred to LMA for risk for protected airway as copious irrigation may be  required  Signed: Ronal Fear, MD 12/02/2020, 8:30 AM

## 2020-12-04 MED ORDER — SODIUM CHLORIDE 0.9 % IV SOLN
3.0000 g | INTRAVENOUS | Status: AC
  Administered 2020-12-05: 3 g via INTRAVENOUS
  Filled 2020-12-04: qty 3

## 2020-12-05 ENCOUNTER — Encounter (HOSPITAL_COMMUNITY): Payer: Self-pay | Admitting: Student

## 2020-12-05 ENCOUNTER — Encounter (HOSPITAL_COMMUNITY)
Admission: RE | Disposition: A | Discharge status: Home / Self Care | Payer: Self-pay | Source: Home / Self Care | Attending: Student

## 2020-12-05 ENCOUNTER — Inpatient Hospital Stay (HOSPITAL_COMMUNITY): Payer: BC Managed Care – PPO | Admitting: Physician Assistant

## 2020-12-05 ENCOUNTER — Other Ambulatory Visit: Payer: Self-pay

## 2020-12-05 ENCOUNTER — Ambulatory Visit (HOSPITAL_COMMUNITY)
Admission: RE | Admit: 2020-12-05 | Discharge: 2020-12-05 | Disposition: A | Discharge status: Home / Self Care | Payer: BC Managed Care – PPO | Attending: Student | Admitting: Student

## 2020-12-05 DIAGNOSIS — K029 Dental caries, unspecified: Secondary | ICD-10-CM | POA: Diagnosis not present

## 2020-12-05 DIAGNOSIS — Z87892 Personal history of anaphylaxis: Secondary | ICD-10-CM | POA: Insufficient documentation

## 2020-12-05 DIAGNOSIS — Z85850 Personal history of malignant neoplasm of thyroid: Secondary | ICD-10-CM | POA: Insufficient documentation

## 2020-12-05 DIAGNOSIS — F418 Other specified anxiety disorders: Secondary | ICD-10-CM | POA: Diagnosis not present

## 2020-12-05 DIAGNOSIS — Z923 Personal history of irradiation: Secondary | ICD-10-CM | POA: Insufficient documentation

## 2020-12-05 DIAGNOSIS — Z8572 Personal history of non-Hodgkin lymphomas: Secondary | ICD-10-CM | POA: Insufficient documentation

## 2020-12-05 DIAGNOSIS — Z7951 Long term (current) use of inhaled steroids: Secondary | ICD-10-CM | POA: Insufficient documentation

## 2020-12-05 DIAGNOSIS — Z885 Allergy status to narcotic agent status: Secondary | ICD-10-CM | POA: Diagnosis not present

## 2020-12-05 DIAGNOSIS — Z9981 Dependence on supplemental oxygen: Secondary | ICD-10-CM | POA: Diagnosis not present

## 2020-12-05 DIAGNOSIS — Z7982 Long term (current) use of aspirin: Secondary | ICD-10-CM | POA: Diagnosis not present

## 2020-12-05 DIAGNOSIS — Z888 Allergy status to other drugs, medicaments and biological substances status: Secondary | ICD-10-CM | POA: Insufficient documentation

## 2020-12-05 DIAGNOSIS — E785 Hyperlipidemia, unspecified: Secondary | ICD-10-CM | POA: Diagnosis not present

## 2020-12-05 DIAGNOSIS — F419 Anxiety disorder, unspecified: Secondary | ICD-10-CM | POA: Insufficient documentation

## 2020-12-05 DIAGNOSIS — Z91018 Allergy to other foods: Secondary | ICD-10-CM | POA: Insufficient documentation

## 2020-12-05 DIAGNOSIS — Z79890 Hormone replacement therapy (postmenopausal): Secondary | ICD-10-CM | POA: Insufficient documentation

## 2020-12-05 DIAGNOSIS — Z79899 Other long term (current) drug therapy: Secondary | ICD-10-CM | POA: Diagnosis not present

## 2020-12-05 DIAGNOSIS — I10 Essential (primary) hypertension: Secondary | ICD-10-CM | POA: Diagnosis not present

## 2020-12-05 HISTORY — PX: TOOTH EXTRACTION: SHX859

## 2020-12-05 LAB — HEMOGLOBIN A1C
Hgb A1c MFr Bld: 6.8 % — ABNORMAL HIGH (ref 4.8–5.6)
Mean Plasma Glucose: 148.46 mg/dL

## 2020-12-05 LAB — GLUCOSE, CAPILLARY: Glucose-Capillary: 132 mg/dL — ABNORMAL HIGH (ref 70–99)

## 2020-12-05 SURGERY — DENTAL RESTORATION/EXTRACTIONS
Anesthesia: General

## 2020-12-05 MED ORDER — PROPOFOL 500 MG/50ML IV EMUL
INTRAVENOUS | Status: DC | PRN
  Administered 2020-12-05: 150 ug/kg/min via INTRAVENOUS

## 2020-12-05 MED ORDER — HYDROCORTISONE NA SUCCINATE PF 100 MG IJ SOLR
INTRAMUSCULAR | Status: AC
  Filled 2020-12-05: qty 2

## 2020-12-05 MED ORDER — SUGAMMADEX SODIUM 500 MG/5ML IV SOLN
INTRAVENOUS | Status: DC | PRN
  Administered 2020-12-05: 500 mg via INTRAVENOUS

## 2020-12-05 MED ORDER — MIDAZOLAM HCL 2 MG/2ML IJ SOLN
INTRAMUSCULAR | Status: AC
  Filled 2020-12-05: qty 2

## 2020-12-05 MED ORDER — SUGAMMADEX SODIUM 500 MG/5ML IV SOLN: INTRAVENOUS | Status: DC | PRN

## 2020-12-05 MED ORDER — HYDROCORTISONE NA SUCCINATE PF 100 MG IJ SOLR
INTRAMUSCULAR | Status: DC | PRN
  Administered 2020-12-05: 100 mg via INTRAVENOUS

## 2020-12-05 MED ORDER — PROPOFOL 10 MG/ML IV BOLUS
INTRAVENOUS | Status: AC
  Filled 2020-12-05: qty 20

## 2020-12-05 MED ORDER — SOD CITRATE-CITRIC ACID 500-334 MG/5ML PO SOLN
ORAL | Status: AC
  Administered 2020-12-05: 30 mL via ORAL
  Filled 2020-12-05: qty 15

## 2020-12-05 MED ORDER — CHLORHEXIDINE GLUCONATE 0.12 % MT SOLN
15.0000 mL | Freq: Once | OROMUCOSAL | Status: AC
  Administered 2020-12-05: 15 mL via OROMUCOSAL

## 2020-12-05 MED ORDER — FENTANYL CITRATE (PF) 100 MCG/2ML IJ SOLN
INTRAMUSCULAR | Status: AC
  Filled 2020-12-05: qty 2

## 2020-12-05 MED ORDER — DEXMEDETOMIDINE HCL 200 MCG/2ML IV SOLN
INTRAVENOUS | Status: DC | PRN
  Administered 2020-12-05: 8 ug via INTRAVENOUS

## 2020-12-05 MED ORDER — PROPOFOL 10 MG/ML IV BOLUS
INTRAVENOUS | Status: DC | PRN
  Administered 2020-12-05: 200 mg via INTRAVENOUS

## 2020-12-05 MED ORDER — MIDAZOLAM HCL 5 MG/5ML IJ SOLN
INTRAMUSCULAR | Status: DC | PRN
  Administered 2020-12-05 (×2): 1 mg via INTRAVENOUS
  Administered 2020-12-05: 2 mg via INTRAVENOUS

## 2020-12-05 MED ORDER — LACTATED RINGERS IV SOLN: INTRAVENOUS | Status: DC

## 2020-12-05 MED ORDER — SOD CITRATE-CITRIC ACID 500-334 MG/5ML PO SOLN
ORAL | Status: AC
  Filled 2020-12-05: qty 15

## 2020-12-05 MED ORDER — ORAL CARE MOUTH RINSE: 15.0000 mL | Freq: Once | OROMUCOSAL | Status: AC

## 2020-12-05 MED ORDER — LIDOCAINE-EPINEPHRINE (PF) 1 %-1:200000 IJ SOLN
INTRAMUSCULAR | Status: AC
  Filled 2020-12-05: qty 30

## 2020-12-05 MED ORDER — MIDAZOLAM HCL 2 MG/2ML IJ SOLN
INTRAMUSCULAR | Status: AC
  Administered 2020-12-05: 2 mg via INTRAVENOUS
  Filled 2020-12-05: qty 2

## 2020-12-05 MED ORDER — LIDOCAINE-EPINEPHRINE (PF) 1 %-1:200000 IJ SOLN
INTRAMUSCULAR | Status: DC | PRN
  Administered 2020-12-05: 8 mL

## 2020-12-05 MED ORDER — AMISULPRIDE (ANTIEMETIC) 5 MG/2ML IV SOLN
10.0000 mg | Freq: Once | INTRAVENOUS | Status: AC
  Administered 2020-12-05: 10 mg via INTRAVENOUS

## 2020-12-05 MED ORDER — DIPHENHYDRAMINE HCL 50 MG/ML IJ SOLN
INTRAMUSCULAR | Status: AC
  Filled 2020-12-05: qty 1

## 2020-12-05 MED ORDER — DIPHENHYDRAMINE HCL 50 MG/ML IJ SOLN
INTRAMUSCULAR | Status: DC | PRN
  Administered 2020-12-05: 12.5 mg via INTRAVENOUS

## 2020-12-05 MED ORDER — PROPOFOL 500 MG/50ML IV EMUL
INTRAVENOUS | Status: AC
  Filled 2020-12-05: qty 100

## 2020-12-05 MED ORDER — MIDAZOLAM HCL 2 MG/2ML IJ SOLN: 2.0000 mg | INTRAMUSCULAR | Status: AC

## 2020-12-05 MED ORDER — FENTANYL CITRATE (PF) 100 MCG/2ML IJ SOLN
INTRAMUSCULAR | Status: DC | PRN
  Administered 2020-12-05: 50 ug via INTRAVENOUS
  Administered 2020-12-05: 100 ug via INTRAVENOUS
  Administered 2020-12-05 (×3): 50 ug via INTRAVENOUS

## 2020-12-05 MED ORDER — SOD CITRATE-CITRIC ACID 500-334 MG/5ML PO SOLN: 30.0000 mL | Freq: Once | ORAL | Status: AC

## 2020-12-05 MED ORDER — STERILE WATER FOR IRRIGATION IR SOLN
Status: DC | PRN
  Administered 2020-12-05: 1000 mL

## 2020-12-05 MED ORDER — ROCURONIUM BROMIDE 10 MG/ML (PF) SYRINGE
PREFILLED_SYRINGE | INTRAVENOUS | Status: DC | PRN
  Administered 2020-12-05: 70 mg via INTRAVENOUS

## 2020-12-05 MED ORDER — 0.9 % SODIUM CHLORIDE (POUR BTL) OPTIME
TOPICAL | Status: DC | PRN
  Administered 2020-12-05: 1000 mL

## 2020-12-05 MED ORDER — APREPITANT 40 MG PO CAPS
40.0000 mg | ORAL_CAPSULE | Freq: Once | ORAL | Status: AC
  Administered 2020-12-05: 40 mg via ORAL
  Filled 2020-12-05: qty 1

## 2020-12-05 MED ORDER — SCOPOLAMINE 1 MG/3DAYS TD PT72
1.0000 | MEDICATED_PATCH | TRANSDERMAL | Status: DC
  Administered 2020-12-05: 1.5 mg via TRANSDERMAL
  Filled 2020-12-05: qty 1

## 2020-12-05 MED ORDER — PHENYLEPHRINE HCL-NACL 10-0.9 MG/250ML-% IV SOLN
INTRAVENOUS | Status: DC | PRN
Start: 2020-12-05 — End: 2020-12-05
  Administered 2020-12-05: 40 ug/min via INTRAVENOUS

## 2020-12-05 MED ORDER — SUGAMMADEX SODIUM 500 MG/5ML IV SOLN
INTRAVENOUS | Status: AC
  Filled 2020-12-05: qty 5

## 2020-12-05 SURGICAL SUPPLY — 31 items
ATTRACTOMAT 16X20 MAGNETIC DRP (DRAPES) ×2 IMPLANT
BAG SPEC THK2 15X12 ZIP CLS (MISCELLANEOUS) ×1
BAG ZIPLOCK 12X15 (MISCELLANEOUS) ×2 IMPLANT
BLADE SURG 15 STRL LF DISP TIS (BLADE) ×3 IMPLANT
BLADE SURG 15 STRL SS (BLADE) ×6
BNDG EYE OVAL (GAUZE/BANDAGES/DRESSINGS) ×4 IMPLANT
BUR CROSS CUT FISSURE 1.6 (BURR) ×1 IMPLANT
CANNULA VESSEL W/WING WO/VALVE (CANNULA) ×2 IMPLANT
COVER WAND RF STERILE (DRAPES) IMPLANT
GAUZE 4X4 16PLY RFD (DISPOSABLE) ×2 IMPLANT
GAUZE SPONGE 4X4 12PLY STRL (GAUZE/BANDAGES/DRESSINGS) ×2 IMPLANT
GLOVE SURG SYN 7.5  E (GLOVE) ×2
GLOVE SURG SYN 7.5 E (GLOVE) ×1 IMPLANT
GLOVE SURG SYN 7.5 PF PI (GLOVE) ×1 IMPLANT
GLOVE SURG UNDER LTX SZ8 (GLOVE) ×2 IMPLANT
KIT BASIN OR (CUSTOM PROCEDURE TRAY) ×2 IMPLANT
KIT TURNOVER KIT A (KITS) ×2 IMPLANT
NDL DENTAL RB 25GX1.25 (NEEDLE) IMPLANT
NEEDLE DENTAL RB 25GX1.25 (NEEDLE) IMPLANT
NS IRRIG 1000ML POUR BTL (IV SOLUTION) ×2 IMPLANT
PACK EENT SPLIT (PACKS) ×2 IMPLANT
PACKING VAGINAL (PACKING) ×2 IMPLANT
PENCIL SMOKE EVACUATOR (MISCELLANEOUS) IMPLANT
SUCTION FRAZIER HANDLE 12FR (TUBING) ×2
SUCTION TUBE FRAZIER 12FR DISP (TUBING) ×1 IMPLANT
SUT CHROMIC 3 0 PS 2 (SUTURE) ×6 IMPLANT
SUT CHROMIC 4 0 P 3 18 (SUTURE) IMPLANT
SUT CHROMIC 4 0 PS 2 18 (SUTURE) ×1 IMPLANT
SYR 50ML LL SCALE MARK (SYRINGE) ×2 IMPLANT
WATER STERILE IRR 1000ML POUR (IV SOLUTION) ×2 IMPLANT
YANKAUER SUCT BULB TIP NO VENT (SUCTIONS) ×2 IMPLANT

## 2020-12-05 NOTE — Op Note (Signed)
Oral and Maxillofacial Surgery Operative Report   Date of Surgery: 12/05/2020 Surgeon: Fabian Sharp, MD, DDS  Preoperative Diagnosis: Caries Tooth #31  Postoperative Diagnosis: Same  Procedure Performed: CPT D7210: Surgical extraction of teeth #31  Anesthesia:  General   Specimens: None  Drains: None  Cultures: None  Estimated Blood Loss: <5 mL  Urine Output: Not measured  Dressings: Fluffs over extraction site intraorally  Complications: None  Operative Findings: Complete removal of teeth #31  Procedure: The patient was identified in the precare holding area and medical history and informed consent were reviewed with the patient and family. All questions were answered. The patient was taken via stretcher to operating room and placed in the supine position on the operating table. Standard lines and monitors were applied. The patient was preoxygenated and general anesthesia was induced via IV. The patient was intubated oroendotracheally without complications. Bilateral breath sounds and end tidal CO2 were noted. Extremities were evaluated and found to be in normal range of motion with all pressure points padded. Preoperative antibiotics were administered. A throat pack was placed and noted. 10 mL of local anesthetic was injected. A second timeout was performed.   Attention was then brought intraorally. Soft tissues surrounding #31 was released with a distal release using a periosteal elevator. #31 was luxated with straight elevators and removed with  forceps. The socket was curetted and all granulation tissue removed.  No purulence was noted. The bone was smoothed with a bone file and the sites were irrigated with copious amounts of normal saline. The site was then closed with a coronally advanced flap using 4-0 chromic gut sutures obtaining primary closure.   The oral cavity and oropharynx were irrigated with copious amounts of normal saline and the oropharynx was  suctioned. The throat pack was removed under direct visualization and the oropharynx was suctioned and an orogastric tube was placed and the stomach contents were removed. The orogastric tube was then removed. The patient was cleansed and dried and turned back to the Anesthesia Care team where he was awakened without event and transferred to the PACU by the Anesthesia Care team for postoperative recovery.4X4 gauze were then placed over extraction site once patient was awake.   Postoperative Care Plan: Patient to be discharged once anesthesia discharge criteria have been met.

## 2020-12-05 NOTE — Discharge Instructions (Signed)
Dental Extraction, Care After These instructions give you information about caring for yourself after your procedure. Your doctor may also give you more specific instructions. Call your doctor if you have any problems or questions after your procedure. Follow these instructions at home: Mouth care  Follow instructions from your doctor about how to take care of the area where your tooth was taken out. Make sure you: ? Wash your hands with soap and water before you touch your mouth or your gauze pads. If you do not have soap and water, use hand sanitizer. ? Change your gauze and take it out as told by your doctor. ? Leave stitches (sutures) in place. They may need to stay in place for 2 weeks or longer, or they may dissolve on their own over several weeks.  If you have bleeding that does not stop, fold a clean piece of gauze and place it on the bleeding gum. Bite down on it gently but firmly. Do not chew on the gauze.  Do not do any of these things until your doctor says it is okay: ? Rinse your mouth. ? Brush or floss near the area where your tooth was taken out. You may brush your other teeth. ? Spit.  After your doctor says that you may rinse your mouth: ? Gargle with a salt-water mixture 3-4 times a day or as needed. To make a salt-water mixture, completely dissolve -1 tsp of salt in 1 cup of warm water. ? Rinse very gently. Do not rinse with a lot of force, because doing that can affect how your mouth heals.  If you wear fake teeth (dental prostheses), talk with your doctor about when you may start to wear them again. Eating and drinking  Do not drink through a straw until your doctor says it is okay.  Eat foods that are cool and have a soft texture, as told by your doctor. Some examples are ice cream and yogurt.  Avoid hot drinks and spicy foods until your mouth has healed.   Activity  Do not drive or use heavy machinery for 24 hours if you were given a medicine to help you  relax (sedative) during your procedure.  Do not drive or use heavy machinery while taking prescription pain medicine.  Return to your normal activities as told by your doctor. Ask your doctor what activities are safe for you. Managing pain and swelling  If told, put ice on your cheek on the side of your mouth where the tooth was taken out: ? Put ice in a plastic bag. ? Place a towel between your skin and the bag. ? Leave the ice on for 20 minutes, 2-3 times a day.   General instructions  Take over-the-counter and prescription medicines only as told by your doctor.  If you are taking prescription pain medicine, take actions to prevent or treat constipation. Your doctor may recommend that you: ? Drink enough fluid to keep your pee (urine) pale yellow. ? Limit foods that are high in fat and processed sugars, such as fried or sweet foods. ? Take an over-the-counter or prescription medicine for constipation.  If you were prescribed an antibiotic medicine, take it as told by your doctor. Do not stop taking the antibiotic even if you start to feel better.  Do not use any products that contain nicotine or tobacco. These include cigarettes and e-cigarettes. If you need help quitting, ask your doctor.  Keep all follow-up visits as told by your doctor. This is  important. Contact a doctor if:  You have pain that does not get better after you take your medicine.  You have any of the following: ? A fever. ? Feeling sick to your stomach (nausea). ? Throwing up (vomiting). ? Chills.  You have any of these in or near the place where your tooth used to be (socket): ? A lot of redness on your face. ? A lot of swelling in your mouth or on your face. ? A small amount of clear fluid or pus. ? New bleeding.  Your symptoms get worse.  You get new symptoms.  You lose feeling (numbness) in your lip or jaw and do not get it back.  You have tingling in your lip or jaw that does not go away. Get  help right away if:  You have very bad bleeding.  You have bleeding that does not stop after you bite down on many gauze pads.  You have very bad pain that does not get better with medicine.  You have swelling that gets worse instead of better.  You have a lot of clear fluid or pus coming from where your tooth was taken out.  You have trouble swallowing.  You cannot open your mouth.  You have shortness of breath.  You have chest pain. Summary  If you have bleeding that does not stop, fold a clean piece of gauze and place it on the bleeding gum. Bite on the gauze gently but firmly.  Do not rinse your mouth or spit until your doctor says that it is okay.  Avoid hot drinks and spicy foods until your mouth heals. This information is not intended to replace advice given to you by your health care provider. Make sure you discuss any questions you have with your health care provider. Document Revised: 04/08/2020 Document Reviewed: 04/08/2020 Elsevier Patient Education  [facility] Anesthesia, Adult, Care After This sheet gives you information about how to care for yourself after your procedure. Your health care provider may also give you more specific instructions. If you have problems or questions, contact your health care provider. What can I expect after the procedure? After the procedure, the following side effects are common:  Pain or discomfort at the IV site.  Nausea.  Vomiting.  Sore throat.  Trouble concentrating.  Feeling cold or chills.  Feeling weak or tired.  Sleepiness and fatigue.  Soreness and body aches. These side effects can affect parts of the body that were not involved in surgery. Follow these instructions at home: For the time period you were told by your health care provider:  Rest.  Do not participate in activities where you could fall or become injured.  Do not drive or use machinery.  Do not drink alcohol.  Do not  take sleeping pills or medicines that cause drowsiness.  Do not make important decisions or sign legal documents.  Do not take care of children on your own.   Eating and drinking  Follow any instructions from your health care provider about eating or drinking restrictions.  When you feel hungry, start by eating small amounts of foods that are soft and easy to digest (bland), such as toast. Gradually return to your regular diet.  Drink enough fluid to keep your urine pale yellow.  If you vomit, rehydrate by drinking water, juice, or clear broth. General instructions  If you have sleep apnea, surgery and certain medicines can increase your risk for breathing problems. Follow instructions  from your health care provider about wearing your sleep device: ? Anytime you are sleeping, including during daytime naps. ? While taking prescription pain medicines, sleeping medicines, or medicines that make you drowsy.  Have a responsible adult stay with you for the time you are told. It is important to have someone help care for you until you are awake and alert.  Return to your normal activities as told by your health care provider. Ask your health care provider what activities are safe for you.  Take over-the-counter and prescription medicines only as told by your health care provider.  If you smoke, do not smoke without supervision.  Keep all follow-up visits as told by your health care provider. This is important. Contact a health care provider if:  You have nausea or vomiting that does not get better with medicine.  You cannot eat or drink without vomiting.  You have pain that does not get better with medicine.  You are unable to pass urine.  You develop a skin rash.  You have a fever.  You have redness around your IV site that gets worse. Get help right away if:  You have difficulty breathing.  You have chest pain.  You have blood in your urine or stool, or you vomit  blood. Summary  After the procedure, it is common to have a sore throat or nausea. It is also common to feel tired.  Have a responsible adult stay with you for the time you are told. It is important to have someone help care for you until you are awake and alert.  When you feel hungry, start by eating small amounts of foods that are soft and easy to digest (bland), such as toast. Gradually return to your regular diet.  Drink enough fluid to keep your urine pale yellow.  Return to your normal activities as told by your health care provider. Ask your health care provider what activities are safe for you. This information is not intended to replace advice given to you by your health care provider. Make sure you discuss any questions you have with your health care provider. Document Revised: 06/02/2020 Document Reviewed: 12/31/2019 Elsevier Patient Education  2021 [employer].

## 2020-12-05 NOTE — Transfer of Care (Signed)
Immediate Anesthesia Transfer of Care Note  Patient: RAYCHEL DOWLER  Procedure(s) Performed: DENTAL RESTORATION/EXTRACTIONS (N/A )  Patient Location: PACU  Anesthesia Type:General  Level of Consciousness: drowsy and patient cooperative  Airway & Oxygen Therapy: Patient Spontanous Breathing and Patient connected to face mask oxygen  Post-op Assessment: Report given to RN and Post -op Vital signs reviewed and stable  Post vital signs: Reviewed and stable  Last Vitals:  Vitals Value Taken Time  BP 103/67 12/05/20 1745  Temp    Pulse 84 12/05/20 1749  Resp 15 12/05/20 1747  SpO2 84 % 12/05/20 1749  Vitals shown include unvalidated device data.  Last Pain:  Vitals:   12/05/20 1421  TempSrc: Oral         Complications: No complications documented.

## 2020-12-05 NOTE — Anesthesia Postprocedure Evaluation (Signed)
Anesthesia Post Note  Patient: [name]  Procedure(s) Performed: DENTAL RESTORATION/EXTRACTIONS (N/A )     Patient location during evaluation: PACU Anesthesia Type: General Level of consciousness: awake and alert, oriented and patient cooperative Pain management: pain level controlled Vital Signs Assessment: post-procedure vital signs reviewed and stable Respiratory status: spontaneous breathing, nonlabored ventilation and respiratory function stable Cardiovascular status: blood pressure returned to baseline and stable Postop Assessment: no apparent nausea or vomiting Anesthetic complications: no Comments: barhemsys given for postop nausea in pacu despite TIVA and other antiemetics prescribed by patient   No complications documented.  Last Vitals:  Vitals:   12/05/20 1421 12/05/20 1745  BP: 140/79   Pulse: 97   Resp: 20   Temp: 37 C 36.6 C  SpO2: 98%     Last Pain:  Vitals:   12/05/20 1745  TempSrc:   PainSc: 0-No pain                 Pervis Hocking

## 2020-12-05 NOTE — H&P (Signed)
Day of Surgery- Update H&P  Patient denies changes to her medical history since her H&P was performed. All questions were answered to patient's satisfaction.

## 2020-12-05 NOTE — Progress Notes (Signed)
As per Dr Royce Macadamia, use [facility] for surgery.

## 2020-12-05 NOTE — Anesthesia Procedure Notes (Signed)
Procedure Name: Intubation Date/Time: 12/05/2020 5:04 PM Performed by: Montel Clock, CRNA Pre-anesthesia Checklist: Patient identified, Emergency Drugs available, Suction available, Patient being monitored and Timeout performed Patient Re-evaluated:Patient Re-evaluated prior to induction Oxygen Delivery Method: Circle system utilized Preoxygenation: Pre-oxygenation with 100% oxygen Induction Type: IV induction Ventilation: Mask ventilation without difficulty Laryngoscope Size: Mac and 3 Grade View: Grade I Tube type: Oral Tube size: 6.0 mm Number of attempts: 1 Airway Equipment and Method: Stylet Placement Confirmation: ETT inserted through vocal cords under direct vision,  positive ETCO2 and breath sounds checked- equal and bilateral Secured at: 21 cm Tube secured with: Tape Dental Injury: Teeth and Oropharynx as per pre-operative assessment

## 2020-12-05 NOTE — Anesthesia Preprocedure Evaluation (Addendum)
Anesthesia Evaluation  Patient identified by MRN, date of birth, ID band Patient awake    Reviewed: Allergy & Precautions, NPO status , Patient's Chart, lab work & pertinent test results  History of Anesthesia Complications (+) PONV and history of anesthetic complications  Airway Mallampati: II  TM Distance: >3 FB Neck ROM: Full    Dental no notable dental hx. (+) Teeth Intact, Dental Advisory Given   Pulmonary asthma , sleep apnea , pneumonia, resolved,  Being w/u for OSA Chronic cough Pulmonary HTN per patient (on oxygen by [place] at home)- all recent cardiac imaging say normal PA pressures and RV function   Pulmonary exam normal breath sounds clear to auscultation       Cardiovascular hypertension, Pt. on medications and Pt. on home beta blockers + Peripheral Vascular Disease and +CHF  Normal cardiovascular exam+ Valvular Problems/Murmurs  Rhythm:Regular Rate:Normal  Raynaud's syndrome Hx/o vasculitis  Hx/o difficult IV stick Hx/o non ischemic CM  Echo 04/28/20 1. Left ventricular ejection fraction, by estimation, is 60 to 65%. The left ventricle has normal function. The left ventricle has no regional wall motion abnormalities. There is mild left ventricular hypertrophy. Left ventricular diastolic parameters were normal.  2. Right ventricule is not well-visualized, but grossly normal size and systolic function. There is normal pulmonary artery systolic pressure.  3. The mitral valve is normal in structure. Trivial mitral valve regurgitation.  4. Tricuspid valve regurgitation is mild to moderate.  5. The aortic valve was not well visualized. Moderately calcified. Aortic valve regurgitation is mild. Mild to moderate aortic valve sclerosis/calcification is present, without any evidence of aortic stenosis.   EKG 04/01/20 NSR, LAD, borderline prolonged QT, early transition  Cardiac Cath 04/02/18 1. No angiographic evidence of  CAD 2. Right and left heart pressures are in a normal range.  3. Non-ischemic cardiomyopathy    Neuro/Psych  Headaches, Seizures -,  PSYCHIATRIC DISORDERS Anxiety Depression Last Sz 11/30/20    GI/Hepatic GERD  Medicated and Controlled,Dental Caries   Endo/Other  Addison's disease Hx/o pituitary adenoma S/P transphenoidal resection Hx/o thyroid nodule S/P thyroidectomy Hx/o Breast  Ca S/P bilateral mastectomies ChemoRx Pre diabetes- hyperglycemia PER PT REQUIRES STRESS DOSE STEROIDS  Renal/GU Renal InsufficiencyRenal diseaseHx/o renal calculi  negative genitourinary   Musculoskeletal S/P amputation fingers    Abdominal   Peds  Hematology  (+) anemia , Hx/o Hodgkin's lymphoma undergoing treatment   Anesthesia Other Findings   Reproductive/Obstetrics negative OB ROS S/p hysterectomy                           Anesthesia Physical Anesthesia Plan  ASA: IV  Anesthesia Plan: General   Post-op Pain Management:    Induction: Intravenous  PONV Risk Score and Plan: 4 or greater and Treatment may vary due to age or medical condition, Midazolam, Amisulpride, Aprepitant, Propofol infusion, TIVA, Scopolamine patch - Pre-op and Diphenhydramine  Airway Management Planned: Oral ETT  Additional Equipment: None  Intra-op Plan:   Post-operative Plan: Extubation in OR  Informed Consent: I have reviewed the patients History and Physical, chart, labs and discussed the procedure including the risks, benefits and alternatives for the proposed anesthesia with the patient or authorized representative who has indicated his/her understanding and acceptance.     Dental advisory given  Plan Discussed with: CRNA  Anesthesia Plan Comments: (Pt has very specific anesthesia requirements- requesting TIVA, emend, scopolamine patch; has reactions to all other antiemetics per pt Reports lock jaw w/ succinylcholine  in past Hydrocortisone 160m IV for stress dose steroids)        Anesthesia Quick Evaluation

## 2020-12-06 ENCOUNTER — Encounter (HOSPITAL_COMMUNITY): Payer: Self-pay | Admitting: Student

## 2020-12-08 ENCOUNTER — Observation Stay (HOSPITAL_COMMUNITY)
Admission: EM | Admit: 2020-12-08 | Discharge: 2020-12-09 | Disposition: A | Discharge status: Home / Self Care | Payer: BC Managed Care – PPO | Attending: Family Medicine | Admitting: Family Medicine

## 2020-12-08 ENCOUNTER — Encounter (HOSPITAL_COMMUNITY): Payer: BC Managed Care – PPO

## 2020-12-08 ENCOUNTER — Other Ambulatory Visit: Payer: Self-pay

## 2020-12-08 ENCOUNTER — Encounter (HOSPITAL_COMMUNITY): Payer: Self-pay | Admitting: Internal Medicine

## 2020-12-08 DIAGNOSIS — E876 Hypokalemia: Secondary | ICD-10-CM | POA: Diagnosis not present

## 2020-12-08 DIAGNOSIS — Z86000 Personal history of in-situ neoplasm of breast: Secondary | ICD-10-CM | POA: Diagnosis present

## 2020-12-08 DIAGNOSIS — Z853 Personal history of malignant neoplasm of breast: Secondary | ICD-10-CM | POA: Insufficient documentation

## 2020-12-08 DIAGNOSIS — E8729 Other acidosis: Secondary | ICD-10-CM

## 2020-12-08 DIAGNOSIS — F329 Major depressive disorder, single episode, unspecified: Secondary | ICD-10-CM | POA: Diagnosis not present

## 2020-12-08 DIAGNOSIS — E785 Hyperlipidemia, unspecified: Secondary | ICD-10-CM | POA: Diagnosis not present

## 2020-12-08 DIAGNOSIS — Z79899 Other long term (current) drug therapy: Secondary | ICD-10-CM | POA: Diagnosis not present

## 2020-12-08 DIAGNOSIS — K219 Gastro-esophageal reflux disease without esophagitis: Secondary | ICD-10-CM | POA: Diagnosis not present

## 2020-12-08 DIAGNOSIS — Z85850 Personal history of malignant neoplasm of thyroid: Secondary | ICD-10-CM

## 2020-12-08 DIAGNOSIS — Z7982 Long term (current) use of aspirin: Secondary | ICD-10-CM | POA: Diagnosis not present

## 2020-12-08 DIAGNOSIS — I10 Essential (primary) hypertension: Secondary | ICD-10-CM | POA: Diagnosis present

## 2020-12-08 DIAGNOSIS — R519 Headache, unspecified: Secondary | ICD-10-CM | POA: Diagnosis present

## 2020-12-08 DIAGNOSIS — R4182 Altered mental status, unspecified: Secondary | ICD-10-CM | POA: Diagnosis not present

## 2020-12-08 DIAGNOSIS — Z5320 Procedure and treatment not carried out because of patient's decision for unspecified reasons: Secondary | ICD-10-CM | POA: Diagnosis not present

## 2020-12-08 DIAGNOSIS — G43909 Migraine, unspecified, not intractable, without status migrainosus: Secondary | ICD-10-CM | POA: Diagnosis present

## 2020-12-08 DIAGNOSIS — E872 Acidosis: Secondary | ICD-10-CM | POA: Diagnosis not present

## 2020-12-08 DIAGNOSIS — Z86018 Personal history of other benign neoplasm: Secondary | ICD-10-CM | POA: Diagnosis present

## 2020-12-08 DIAGNOSIS — E271 Primary adrenocortical insufficiency: Secondary | ICD-10-CM | POA: Diagnosis not present

## 2020-12-08 DIAGNOSIS — Z8571 Personal history of Hodgkin lymphoma: Secondary | ICD-10-CM | POA: Diagnosis not present

## 2020-12-08 DIAGNOSIS — Z8541 Personal history of malignant neoplasm of cervix uteri: Secondary | ICD-10-CM

## 2020-12-08 DIAGNOSIS — R569 Unspecified convulsions: Principal | ICD-10-CM

## 2020-12-08 DIAGNOSIS — F331 Major depressive disorder, recurrent, moderate: Secondary | ICD-10-CM

## 2020-12-08 DIAGNOSIS — R531 Weakness: Secondary | ICD-10-CM | POA: Diagnosis present

## 2020-12-08 DIAGNOSIS — R410 Disorientation, unspecified: Secondary | ICD-10-CM | POA: Diagnosis not present

## 2020-12-08 DIAGNOSIS — E2740 Unspecified adrenocortical insufficiency: Secondary | ICD-10-CM | POA: Diagnosis not present

## 2020-12-08 DIAGNOSIS — R4020 Unspecified coma: Secondary | ICD-10-CM | POA: Diagnosis not present

## 2020-12-08 DIAGNOSIS — E890 Postprocedural hypothyroidism: Secondary | ICD-10-CM | POA: Diagnosis not present

## 2020-12-08 DIAGNOSIS — I110 Hypertensive heart disease with heart failure: Secondary | ICD-10-CM | POA: Insufficient documentation

## 2020-12-08 DIAGNOSIS — F411 Generalized anxiety disorder: Secondary | ICD-10-CM

## 2020-12-08 DIAGNOSIS — I50810 Right heart failure, unspecified: Secondary | ICD-10-CM | POA: Insufficient documentation

## 2020-12-08 DIAGNOSIS — Z20822 Contact with and (suspected) exposure to covid-19: Secondary | ICD-10-CM | POA: Diagnosis not present

## 2020-12-08 DIAGNOSIS — R0902 Hypoxemia: Secondary | ICD-10-CM | POA: Diagnosis not present

## 2020-12-08 DIAGNOSIS — R404 Transient alteration of awareness: Secondary | ICD-10-CM | POA: Diagnosis not present

## 2020-12-08 LAB — BLOOD GAS, VENOUS
Acid-base deficit: 0.1 mmol/L (ref 0.0–2.0)
Bicarbonate: 24.5 mmol/L (ref 20.0–28.0)
Drawn by: 1470
FIO2: 21
O2 Saturation: 64.4 %
Patient temperature: 37
pCO2, Ven: 43.3 mmHg — ABNORMAL LOW (ref 44.0–60.0)
pH, Ven: 7.37 (ref 7.250–7.430)
pO2, Ven: 36.4 mmHg (ref 32.0–45.0)

## 2020-12-08 LAB — COMPREHENSIVE METABOLIC PANEL
ALT: 43 U/L (ref 0–44)
AST: 32 U/L (ref 15–41)
Albumin: 4.3 g/dL (ref 3.5–5.0)
Alkaline Phosphatase: 67 U/L (ref 38–126)
Anion gap: 17 — ABNORMAL HIGH (ref 5–15)
BUN: 19 mg/dL (ref 6–20)
CO2: 21 mmol/L — ABNORMAL LOW (ref 22–32)
Calcium: 9.4 mg/dL (ref 8.9–10.3)
Chloride: 103 mmol/L (ref 98–111)
Creatinine, Ser: 1.24 mg/dL — ABNORMAL HIGH (ref 0.44–1.00)
GFR, Estimated: 53 mL/min — ABNORMAL LOW (ref >60)
Glucose, Bld: 113 mg/dL — ABNORMAL HIGH (ref 70–99)
Potassium: 3.3 mmol/L — ABNORMAL LOW (ref 3.5–5.1)
Sodium: 141 mmol/L (ref 135–145)
Total Bilirubin: 0.6 mg/dL (ref 0.3–1.2)
Total Protein: 8.2 g/dL — ABNORMAL HIGH (ref 6.5–8.1)

## 2020-12-08 LAB — CBC WITH DIFFERENTIAL/PLATELET
Abs Immature Granulocytes: 0.02 10*3/uL (ref 0.00–0.07)
Basophils Absolute: 0.1 10*3/uL (ref 0.0–0.1)
Basophils Relative: 1 %
Eosinophils Absolute: 0.2 10*3/uL (ref 0.0–0.5)
Eosinophils Relative: 2 %
HCT: 38.2 % (ref 36.0–46.0)
Hemoglobin: 11.9 g/dL — ABNORMAL LOW (ref 12.0–15.0)
Immature Granulocytes: 0 %
Lymphocytes Relative: 30 %
Lymphs Abs: 2.9 10*3/uL (ref 0.7–4.0)
MCH: 26.9 pg (ref 26.0–34.0)
MCHC: 31.2 g/dL (ref 30.0–36.0)
MCV: 86.2 fL (ref 80.0–100.0)
Monocytes Absolute: 0.7 10*3/uL (ref 0.1–1.0)
Monocytes Relative: 7 %
Neutro Abs: 5.8 10*3/uL (ref 1.7–7.7)
Neutrophils Relative %: 60 %
Platelets: 350 10*3/uL (ref 150–400)
RBC: 4.43 MIL/uL (ref 3.87–5.11)
RDW: 15.2 % (ref 11.5–15.5)
WBC: 9.6 10*3/uL (ref 4.0–10.5)
nRBC: 0 % (ref 0.0–0.2)

## 2020-12-08 LAB — URINALYSIS, ROUTINE W REFLEX MICROSCOPIC
Bilirubin Urine: NEGATIVE
Glucose, UA: NEGATIVE mg/dL
Hgb urine dipstick: NEGATIVE
Ketones, ur: NEGATIVE mg/dL
Leukocytes,Ua: NEGATIVE
Nitrite: NEGATIVE
Protein, ur: NEGATIVE mg/dL
Specific Gravity, Urine: 1.008 (ref 1.005–1.030)
pH: 7 (ref 5.0–8.0)

## 2020-12-08 LAB — MAGNESIUM: Magnesium: 1.8 mg/dL (ref 1.7–2.4)

## 2020-12-08 LAB — LACTIC ACID, PLASMA
Lactic Acid, Venous: 2.3 mmol/L (ref 0.5–1.9)
Lactic Acid, Venous: 2.6 mmol/L (ref 0.5–1.9)

## 2020-12-08 LAB — CORTISOL: Cortisol, Plasma: 4.9 ug/dL

## 2020-12-08 MED ORDER — COLCHICINE 0.6 MG PO TABS: 0.6000 mg | ORAL_TABLET | Freq: Every day | ORAL | Status: DC

## 2020-12-08 MED ORDER — METOPROLOL SUCCINATE ER 25 MG PO TB24
37.5000 mg | ORAL_TABLET | Freq: Every day | ORAL | Status: DC
  Filled 2020-12-08: qty 2

## 2020-12-08 MED ORDER — DM-GUAIFENESIN ER 30-600 MG PO TB12
1.0000 | ORAL_TABLET | Freq: Two times a day (BID) | ORAL | Status: DC
  Administered 2020-12-09: 1 via ORAL
  Filled 2020-12-08 (×2): qty 1

## 2020-12-08 MED ORDER — KETOROLAC TROMETHAMINE 15 MG/ML IJ SOLN: 15.0000 mg | Freq: Once | INTRAMUSCULAR | Status: DC

## 2020-12-08 MED ORDER — MIDAZOLAM HCL 2 MG/2ML IJ SOLN
1.0000 mg | Freq: Once | INTRAMUSCULAR | Status: AC | PRN
  Administered 2020-12-08: 1 mg via INTRAVENOUS

## 2020-12-08 MED ORDER — BUDESONIDE 0.5 MG/2ML IN SUSP
0.5000 mg | Freq: Two times a day (BID) | RESPIRATORY_TRACT | Status: DC
  Filled 2020-12-08 (×2): qty 2

## 2020-12-08 MED ORDER — DIPHENHYDRAMINE HCL 50 MG/ML IJ SOLN
25.0000 mg | Freq: Once | INTRAMUSCULAR | Status: AC
  Administered 2020-12-08: 25 mg via INTRAVENOUS

## 2020-12-08 MED ORDER — ALUM & MAG HYDROXIDE-SIMETH 200-200-20 MG/5ML PO SUSP: 30.0000 mL | Freq: Every day | ORAL | Status: DC | PRN

## 2020-12-08 MED ORDER — MIDAZOLAM HCL 2 MG/2ML IJ SOLN
2.0000 mg | Freq: Once | INTRAMUSCULAR | Status: AC
  Administered 2020-12-08: 2 mg via INTRAVENOUS

## 2020-12-08 MED ORDER — EPINEPHRINE 0.3 MG/0.3ML IJ SOAJ: 0.3000 mg | Freq: Once | INTRAMUSCULAR | Status: DC

## 2020-12-08 MED ORDER — PANTOPRAZOLE SODIUM 40 MG PO TBEC: 40.0000 mg | DELAYED_RELEASE_TABLET | Freq: Every day | ORAL | Status: DC

## 2020-12-08 MED ORDER — ESCITALOPRAM OXALATE 10 MG PO TABS
20.0000 mg | ORAL_TABLET | Freq: Every evening | ORAL | Status: DC
  Administered 2020-12-09: 20 mg via ORAL
  Filled 2020-12-08 (×2): qty 2

## 2020-12-08 MED ORDER — MIDAZOLAM HCL 2 MG/2ML IJ SOLN
INTRAMUSCULAR | Status: AC
  Filled 2020-12-08: qty 2

## 2020-12-08 MED ORDER — KETOROLAC TROMETHAMINE 30 MG/ML IJ SOLN: 15.0000 mg | Freq: Once | INTRAMUSCULAR | Status: DC

## 2020-12-08 MED ORDER — HYDROMORPHONE HCL 1 MG/ML IJ SOLN
0.5000 mg | Freq: Once | INTRAMUSCULAR | Status: AC
  Administered 2020-12-08: 0.5 mg via INTRAVENOUS
  Filled 2020-12-08: qty 1

## 2020-12-08 MED ORDER — DIPHENHYDRAMINE HCL 50 MG/ML IJ SOLN
INTRAMUSCULAR | Status: AC
  Filled 2020-12-08: qty 1

## 2020-12-08 MED ORDER — FLUTICASONE FUROATE-VILANTEROL 200-25 MCG/INH IN AEPB
1.0000 | INHALATION_SPRAY | Freq: Every day | RESPIRATORY_TRACT | Status: DC
  Filled 2020-12-08: qty 28

## 2020-12-08 MED ORDER — MIDAZOLAM HCL 2 MG/2ML IJ SOLN
1.0000 mg | Freq: Once | INTRAMUSCULAR | Status: AC
  Administered 2020-12-08: 1 mg via INTRAVENOUS

## 2020-12-08 MED ORDER — ENOXAPARIN SODIUM 40 MG/0.4ML ~~LOC~~ SOLN
40.0000 mg | SUBCUTANEOUS | Status: DC
  Administered 2020-12-08: 40 mg via SUBCUTANEOUS
  Filled 2020-12-08: qty 0.4

## 2020-12-08 MED ORDER — BUMETANIDE 1 MG PO TABS: 1.0000 mg | ORAL_TABLET | Freq: Two times a day (BID) | ORAL | Status: DC

## 2020-12-08 MED ORDER — LEVOTHYROXINE SODIUM 112 MCG PO TABS
112.0000 ug | ORAL_TABLET | Freq: Every day | ORAL | Status: DC
  Administered 2020-12-09: 112 ug via ORAL
  Filled 2020-12-08: qty 1

## 2020-12-08 MED ORDER — TOPIRAMATE 100 MG PO TABS
150.0000 mg | ORAL_TABLET | Freq: Every day | ORAL | Status: DC
  Administered 2020-12-08: 150 mg via ORAL
  Filled 2020-12-08: qty 2

## 2020-12-08 MED ORDER — LACTATED RINGERS IV BOLUS
1000.0000 mL | Freq: Once | INTRAVENOUS | Status: AC
  Administered 2020-12-08: 1000 mL via INTRAVENOUS

## 2020-12-08 MED ORDER — DRONABINOL 2.5 MG PO CAPS
2.5000 mg | ORAL_CAPSULE | Freq: Two times a day (BID) | ORAL | Status: DC | PRN
  Administered 2020-12-09: 2.5 mg via ORAL
  Filled 2020-12-08 (×2): qty 1

## 2020-12-08 MED ORDER — MIDAZOLAM HCL 2 MG/2ML IJ SOLN
2.0000 mg | Freq: Once | INTRAMUSCULAR | Status: DC
  Filled 2020-12-08: qty 2

## 2020-12-08 MED ORDER — HYDROCORTISONE NA SUCCINATE PF 100 MG IJ SOLR
100.0000 mg | Freq: Once | INTRAMUSCULAR | Status: AC
  Administered 2020-12-08: 100 mg via INTRAVENOUS
  Filled 2020-12-08: qty 2

## 2020-12-08 MED ORDER — DIPHENHYDRAMINE HCL 50 MG/ML IJ SOLN
50.0000 mg | Freq: Three times a day (TID) | INTRAMUSCULAR | Status: AC | PRN
  Administered 2020-12-08: 50 mg via INTRAVENOUS
  Filled 2020-12-08: qty 1

## 2020-12-08 MED ORDER — HYDROCORTISONE 10 MG PO TABS: 10.0000 mg | ORAL_TABLET | ORAL | Status: DC

## 2020-12-08 MED ORDER — ACETAMINOPHEN 500 MG PO TABS: 1000.0000 mg | ORAL_TABLET | Freq: Once | ORAL | Status: DC

## 2020-12-08 MED ORDER — HYDROCORTISONE 20 MG PO TABS
50.0000 mg | ORAL_TABLET | Freq: Every day | ORAL | Status: DC
  Filled 2020-12-08 (×3): qty 1

## 2020-12-08 MED ORDER — SODIUM CHLORIDE 0.9 % IV BOLUS
1000.0000 mL | Freq: Once | INTRAVENOUS | Status: AC
  Administered 2020-12-08: 1000 mL via INTRAVENOUS

## 2020-12-08 MED ORDER — ASPIRIN 325 MG PO TABS: 325.0000 mg | ORAL_TABLET | Freq: Every day | ORAL | Status: DC

## 2020-12-08 MED ORDER — HYDROCORTISONE 10 MG PO TABS
10.0000 mg | ORAL_TABLET | ORAL | Status: DC
Start: 2020-12-09 — End: 2020-12-08

## 2020-12-08 MED ORDER — ROSUVASTATIN CALCIUM 20 MG PO TABS
10.0000 mg | ORAL_TABLET | Freq: Every day | ORAL | Status: DC
  Filled 2020-12-08: qty 1

## 2020-12-08 MED ORDER — ALBUTEROL SULFATE (2.5 MG/3ML) 0.083% IN NEBU: 2.5000 mg | INHALATION_SOLUTION | Freq: Four times a day (QID) | RESPIRATORY_TRACT | Status: DC | PRN

## 2020-12-08 MED ORDER — RIMEGEPANT SULFATE 75 MG PO TBDP: 75.0000 mg | ORAL_TABLET | Freq: Every day | ORAL | Status: DC | PRN

## 2020-12-08 MED ORDER — HYDROCORTISONE 20 MG PO TABS: 20.0000 mg | ORAL_TABLET | Freq: Every day | ORAL | Status: DC

## 2020-12-08 MED ORDER — ALPRAZOLAM 0.5 MG PO TABS
0.5000 mg | ORAL_TABLET | Freq: Two times a day (BID) | ORAL | Status: DC | PRN
  Filled 2020-12-08 (×2): qty 1

## 2020-12-08 MED ORDER — SODIUM CHLORIDE 0.9% FLUSH
3.0000 mL | Freq: Two times a day (BID) | INTRAVENOUS | Status: DC
  Administered 2020-12-09: 3 mL via INTRAVENOUS

## 2020-12-08 MED ORDER — ZOLPIDEM TARTRATE 5 MG PO TABS
5.0000 mg | ORAL_TABLET | Freq: Every evening | ORAL | Status: DC | PRN
  Administered 2020-12-08: 5 mg via ORAL
  Filled 2020-12-08: qty 1

## 2020-12-08 MED ORDER — POTASSIUM CHLORIDE CRYS ER 20 MEQ PO TBCR
40.0000 meq | EXTENDED_RELEASE_TABLET | Freq: Once | ORAL | Status: AC
  Administered 2020-12-08: 40 meq via ORAL
  Filled 2020-12-08: qty 2

## 2020-12-08 MED ORDER — BUMETANIDE 1 MG PO TABS
1.0000 mg | ORAL_TABLET | Freq: Two times a day (BID) | ORAL | Status: DC
  Administered 2020-12-08: 1 mg via ORAL
  Filled 2020-12-08 (×4): qty 1

## 2020-12-08 MED ORDER — HYDROCORTISONE 10 MG PO TABS
10.0000 mg | ORAL_TABLET | Freq: Every day | ORAL | Status: DC
  Filled 2020-12-08: qty 1

## 2020-12-08 MED ORDER — FLUTICASONE FUROATE-VILANTEROL 200-25 MCG/INH IN AEPB: 1.0000 | INHALATION_SPRAY | Freq: Every day | RESPIRATORY_TRACT | Status: DC

## 2020-12-08 NOTE — Progress Notes (Signed)
I introduced myself to patient and her husband. I explained that I am the admitting nurse and that I came to do the nursing history for the nurses on the unit that she will be going to. She wanted to know what service she is being admitted to and what room she will go to. I explained to her that the medical doctors from [place] will be admitting her and that bed control will assign the room number after the doctor speaks with her and orders the type bed he or she wants. Patient states she does not want to answer any more of the questions until she can speak with the doctor. Informed pt's rn Wilber Oliphant of this. Lucius Conn BSN, RN-BC Admissions RN 12/08/2020 6:27 PM

## 2020-12-08 NOTE — H&P (Signed)
History and Physical   ERNEST ORR DXA:[id number] DOB: 12/05/1970 DOA: 12/08/2020  PCP: Nicholas Lose, MD   Patient coming from: Home  Chief Complaint: Seizure-like episode  HPI: [name] is a 50 y.o. female with medical history significant of has disease, renal insufficiency, chronic respiratory failure, GERD, headache, history of cancer including cervical, breast, Hodgkin's with pituitary lesion resection leading to adrenal insufficiency as above, hyperlipidemia, hypertension, asthma, aortic stenosis who presents following seizure-like episode.  Patient was recently in the hospital for a dental procedure as she needed to have stress dose steroids of Solu-Cortef due to her adrenal insufficiency around the procedure time.  She did not have a taper on discharge.  Which she states she normally does.  The night after she was discharged she felt a little bit off which she attributed to the anesthesia.  Then she began to feel weaker yesterday.  Today she had an episode of unresponsiveness but not on consciousness that was witnessed by her husband who called EMS.  She had has some episodes over the last few months that were similar.  No shaking-like activity until she arrived in the ED per her and ED report.  No incontinence no tongue biting no full loss of consciousness.  She came in because of concern for adrenal crisis given she felt like she had had similar presentations in the past, but she states this feels somewhat different and may be a little worse.  She reports having fevers previously with some of her seasonal episodes but no fevers after her procedure.  She reports nausea that she has to take Benadryl for.  Of note, she was initially very hesitant to transfer to [facility] as she has had bad experiences there with staff in the past.  She ultimately agreed to continue work-up at [facility] but was tearful when discussing.  ED Course: Vital signs in the ED were stable.  Lab work-up showed  CMP with potassium 3.3, magnesium normal, bicarb 21, gap 17, creatinine 1.24 which is stable, protein 8.2.  CBC showed mild anemia with hemoglobin 11.9 which is not far from baseline.  Lactic acid was 2.3 initially with repeat pending.  Respiratory panel for flu COVID pending.  ACTH, cortisol, aldosterone renin levels pending.  Urinalysis and blood cultures also pending.  Neurology was consulted their main concern is possible pseudoseizure but seizure does make possibility in the like to capture these events though she is to be transferred to [facility] for continuous EEG monitoring.  Review of Systems: As per HPI otherwise all other systems reviewed and are negative.  Past Medical History:  Diagnosis Date  . Addison's disease ([place])   . Adrenal insufficiency ([place])   . Anemia   . Anxiety   . Aortic stenosis   . Aortic stenosis   . Appendicitis 12/19/09  . Appendicitis   . Breast cancer ([place])    STATUS POST BILATERAL MASTECTOMY. STATUS POST RECONSTRUCTION. SHE HAD SILICONE BREAST IMPLANTS AND THE LEFT IMPLANT IS LEAKING SLIGHTLY  . Cellulitis of right middle finger 11/07/2018  . Cervical cancer ([place]) 12/23/2018  . Chest pain   . CHF with right heart failure ([place]) 04/17/2017  . Chronic respiratory failure with hypoxia ([place]) 12/23/2018  . Cough variant asthma 04/13/2019  . Depression   . GERD (gastroesophageal reflux disease)    takes Dexilant and carafate and gi coctail   . Headache    migraines on a daily and monthly regimen   . Heart murmur   .  History of kidney stones   . Hodgkin lymphoma (HCC)    STATUS POST MANTLE RADIATION  . Hodgkin's lymphoma ([place])    1987  . Hypertension   . Hypoxia   . Necrotizing fasciitis ([place]) 12/23/2018  . Non-ischemic cardiomyopathy ([place])   . Osteoporosis   . Palpitations   . Pituitary adenoma ([place]) 12/23/2018  . Pneumonia   . PONV (postoperative nausea and vomiting)   . Pre-diabetes    per pt; no meds  . Pulmonary hypertension ([place]) 12/23/2018  .  Raynaud phenomenon   . Right heart failure ([place]) 04/17/2017  . Seizures ([place])    last febrile seizure was approx 3 weeks ago per report on 12/01/2020  . Sleep apnea    upcoming sleep study per pt   . Supplemental oxygen dependent    3 liters  . SVT (supraventricular tachycardia) ([place])   . Tachycardia   . Thyroid cancer ([place])    STATUS POST SURGICAL REMOVAL-CURRENT ON THYROID REPLACEMENT    Past Surgical History:  Procedure Laterality Date  . ABDOMINAL HYSTERECTOMY    . AMPUTATION Left 01/30/2019   Procedure: Left Index finger amputation with flap reconstruction and repair reconstruction;  Surgeon: Roseanne Kaufman, MD;  Location: [facility];  Service: Orthopedics;  Laterality: Left;  . APPENDECTOMY    . breast implants and removal     . breast implants but leaking     . CARDIAC CATHETERIZATION  05/18/09   NORMAL CATH  . COLONOSCOPY    . hx of chemotherapy     . hx of radiation therapy     . I & D EXTREMITY Left 12/23/2018   Procedure: IRRIGATION AND DEBRIDEMENT HAND / INDEX FINGER;  Surgeon: Roseanne Kaufman, MD;  Location: [facility];  Service: Orthopedics;  Laterality: Left;  . IR IMAGING GUIDED PORT INSERTION  05/06/2020  . IR REMOVAL TUN ACCESS W/ PORT W/O FL MOD SED  04/27/2020  . KIDNEY STONE SURGERY    . LUMBAR PUNCTURE W/ INTRATHECAL CHEMOTHERAPY    . MASTECTOMY    . PITUITARY SURGERY    . RIGHT/LEFT HEART CATH AND CORONARY ANGIOGRAPHY N/A 04/02/2018   Procedure: RIGHT/LEFT HEART CATH AND CORONARY ANGIOGRAPHY;  Surgeon: Burnell Blanks, MD;  Location: [facility];  Service: Cardiovascular;  Laterality: N/A;  . TOOTH EXTRACTION N/A 12/05/2020   Procedure: DENTAL RESTORATION/EXTRACTIONS;  Surgeon: Ronal Fear, MD;  Location: [facility];  Service: Oral Surgery;  Laterality: N/A;  . TOTAL THYROIDECTOMY    . VIDEO BRONCHOSCOPY Bilateral 11/14/2018   Procedure: VIDEO BRONCHOSCOPY WITHOUT FLUORO;  Surgeon: Margaretha Seeds, MD;  Location: [facility];  Service:  Cardiopulmonary;  Laterality: Bilateral;  . VIDEO BRONCHOSCOPY WITH ENDOBRONCHIAL ULTRASOUND N/A 11/19/2018   Procedure: VIDEO BRONCHOSCOPY WITH ENDOBRONCHIAL ULTRASOUND;  Surgeon: Margaretha Seeds, MD;  Location: [facility];  Service: Thoracic;  Laterality: N/A;    Social History  reports that she has never smoked. She has never used smokeless tobacco. She reports previous alcohol use. She reports that she does not use drugs.  Allergies  Allergen Reactions  . Ferrous Bisglycinate Chelate [Iron] Anaphylaxis    Only IV   . Mushroom Extract Complex Anaphylaxis  . Na Ferric Gluc Cplx In Sucrose Anaphylaxis  . Cymbalta [Duloxetine Hcl] Swelling and Anxiety  . Succinylcholine Other (See Comments)    Lock Jaw  . Buprenorphine Hcl Hives  . Compazine Other (See Comments)    Altered mental status Aggression  . Metoclopramide Other (See Comments)  Dystonia  . Morphine And Related Hives  . Ondansetron Hives and Rash  . Promethazine Hcl Hives  . Tegaderm Ag Mesh [Silver] Rash    Old formulation only, is able to tolerate new formulation    Family History  Family history unknown: Yes  Reviewed on Admission  Prior to Admission medications   Medication Sig Start Date End Date Taking? Authorizing Provider  aspirin 325 MG tablet Take 325 mg by mouth at bedtime.    Yes [provider]  beclomethasone (QVAR) 80 MCG/ACT inhaler Inhale 1 puff into the lungs 2 (two) times daily.    Yes [provider]  bumetanide (BUMEX) 1 MG tablet Take 1 tablet (1 mg total) by mouth 2 (two) times daily. 10/04/20  Yes Nicholas Lose, MD  colchicine 0.6 MG tablet Take 0.6 mg by mouth daily in the afternoon.  12/31/19  Yes [provider]  dexlansoprazole (DEXILANT) 60 MG capsule Take 60 mg by mouth daily.   Yes [provider]  dronabinol (MARINOL) 2.5 MG capsule TAKE 1 CAPSULE (2.5 MG TOTAL) BY MOUTH 2 (TWO) TIMES DAILY AS NEEDED (NAUSEA). Patient taking differently: Take 2.5 mg by  mouth at bedtime. 11/09/20  Yes Nicholas Lose, MD  escitalopram (LEXAPRO) 20 MG tablet Take 20 mg by mouth every evening.  04/24/16  Yes [provider]  fluticasone furoate-vilanterol (BREO ELLIPTA) 200-25 MCG/INH AEPB Inhale 1 puff into the lungs daily.   Yes [provider]  Galcanezumab-gnlm 120 MG/ML SOAJ Inject 120 mg into the skin every 28 (twenty-eight) days.  03/31/19  Yes [provider]  heparin lock flush 100 UNIT/ML SOLN injection USE 5 MLS IN PORT A CATH ONCE DAILY AFTER MEDICATION ADMINISTRATION AS A HEPLOCK AS DIRECTED. Patient taking differently: Inject 500 Units into the vein daily. 11/16/20  Yes Nicholas Lose, MD  hydrocortisone (CORTEF) 10 MG tablet Take 1-2 tablets (10-20 mg total) by mouth See admin instructions. Take 20 mg in the am and 26m in the evening Patient taking differently: Take 10-20 mg by mouth See admin instructions. Take 20 mg by mouth in the morning and 115min the evening 02/17/20  Yes Kyle, Tyrone A, DO  levothyroxine (SYNTHROID, LEVOTHROID) 112 MCG tablet Take 112 mcg by mouth daily before breakfast.   Yes [provider]  metoprolol succinate (TOPROL-XL) 25 MG 24 hr tablet Take 37.5 mg by mouth daily.    Yes [provider]  potassium chloride (KLOR-CON) 10 MEQ tablet Take 10 mEq by mouth daily as needed (low potassium).   Yes [provider]  rosuvastatin (CRESTOR) 10 MG tablet Take 10 mg by mouth daily.  02/28/18  Yes [provider]  Sodium Chloride Flush (NORMAL SALINE FLUSH) 0.9 % SOLN FLUSH PORT A CATH WITH 10 ML SODIUM CHLORIDE FLUSH 0.9% BEFORE AND AFTER FLUIDS TO MAINTAIN PATENCY 11/16/20  Yes GuNicholas LoseMD  topiramate (TOPAMAX) 50 MG tablet Take 150 mg by mouth at bedtime.  12/25/19  Yes [provider]  zolpidem (AMBIEN CR) 12.5 MG CR tablet Take 12.5 mg by mouth at bedtime.   Yes [provider]  albuterol (2.5 MG/3ML) 0.083% NEBU 3 mL, albuterol (5 MG/ML) 0.5% NEBU 0.5 mL  Inhale 3 mg into the lungs every 6 (six) hours as needed (SOB).     [provider]  albuterol (VENTOLIN HFA) 108 (90 Base) MCG/ACT inhaler Inhale 2 puffs into the lungs every 6 (six) hours as needed for wheezing or shortness of breath.    [provider]  ALPRAZolam (XANAX) 0.5 MG tablet Take 0.5 mg by mouth 2 (two) times daily as needed for anxiety. 09/21/20   [provider]  alum & mag hydroxide-simeth (MAALOX/MYLANTA) 200-200-20 MG/5ML suspension Take 30 mLs by mouth daily as needed for indigestion or heartburn. For Bleeding Ulcers    [provider]  benzonatate (TESSALON) 200 MG capsule Take 1 capsule (200 mg total) by mouth 3 (three) times daily as needed for cough. Patient not taking: No sig reported 04/15/19   Martyn Ehrich, NP  diphenhydrAMINE (BENADRYL) 50 MG/ML injection Inject 1 mL (50 mg total) into the vein every 6 (six) hours as needed (nausea). Patient not taking: No sig reported 12/25/18   Rai, Ripudeep K, MD  EPINEPHRINE 0.3 mg/0.3 mL IJ SOAJ injection INJECT 0.3 MLS (0.3 MG TOTAL) INTO THE MUSCLE AS NEEDED FOR ANAPHYLAXIS. Patient taking differently: Inject 0.3 mg into the muscle daily as needed for anaphylaxis. 12/09/19   Margaretha Seeds, MD  HYDROcodone-acetaminophen (NORCO) 5-325 MG tablet Take 1 tablet by mouth every 6 (six) hours as needed for moderate pain. Patient not taking: No sig reported 06/03/20   Sandi Mealy E., PA-C  LORazepam (ATIVAN) 0.5 MG tablet TAKE 1 TO 2 TABLETS BY MOUTH TWICE A DAY AS NEEDED FOR NAUSEA Patient taking differently: Take 0.5 mg by mouth 2 (two) times daily as needed (nausea). 11/09/20   Nicholas Lose, MD  Mepolizumab (NUCALA) 100 MG SOLR Inject 100 mg into the skin every 28 (twenty-eight) days. Patient not taking: No sig reported 05/13/19   Margaretha Seeds, MD  OXYGEN Inhale 3-4 L into the lungs continuous.    [provider]  potassium chloride (KLOR-CON) 10 MEQ tablet Take 1 tablet (10 mEq total)  by mouth daily for 21 days. 07/15/20 08/05/20  Alfredia Client, PA-C  Rimegepant Sulfate (NURTEC) 75 MG TBDP Take 75 mg by mouth daily as needed (Migraine).     [provider]  silver sulfADIAZINE (SILVADENE) 1 % cream Apply 1 application topically daily as needed (Open Wound).  11/16/19   [provider]  sodium chloride 0.9 % infusion Use as directed. 04/19/20   Nicholas Lose, MD  sucralfate (CARAFATE) 1 g tablet Take 1 g by mouth daily as needed (FOR ULCERS).     [provider]    Physical Exam: Vitals:   12/08/20 1530 12/08/20 1600 12/08/20 1630 12/08/20 1700  BP: (!) 144/97 131/83 130/81 128/84  Pulse: 88 76 86 72  Resp: 12 (!) _0 Temp:      TempSrc:      SpO2: 98% 98% 98% 96%  Weight:      Height:       Physical Exam Constitutional:      General: She is not in acute distress.    Comments: Tearful, anxious appearing female  HENT:     Head: Normocephalic and atraumatic.     Mouth/Throat:     Mouth: Mucous membranes are moist.     Pharynx: Oropharynx is clear.  Eyes:     Extraocular Movements: Extraocular movements intact.     Pupils: Pupils are equal, round, and reactive to light.  Cardiovascular:     Rate and Rhythm: Normal rate and regular rhythm.     Pulses: Normal pulses.     Heart sounds: Murmur heard.    Pulmonary:     Effort: Pulmonary effort is normal. No respiratory distress.     Breath sounds: Normal breath sounds.  Abdominal:  General: Bowel sounds are normal. There is no distension.     Palpations: Abdomen is soft.     Tenderness: There is no abdominal tenderness.  Musculoskeletal:        General: No swelling or deformity.  Skin:    General: Skin is warm and dry.  Neurological:     General: No focal deficit present.     Mental Status: Mental status is at baseline.     Comments: No apparent focal deficits, moves all extremities spontaneously.  At times while speaking she would have what appear to be intentional  facial twitches and movements along with tongue movements.  These appear to be intentional as she is able to do them in the middle speaking and they do not interrupt her speech.     Labs on Admission: I have personally reviewed following labs and imaging studies  CBC: Recent Labs  Lab 12/08/20 1535  WBC 9.6  NEUTROABS 5.8  HGB 11.9*  HCT 38.2  MCV 86.2  PLT 854    Basic Metabolic Panel: Recent Labs  Lab 12/08/20 1535  NA 141  K 3.3*  CL 103  CO2 21*  GLUCOSE 113*  BUN 19  CREATININE 1.24*  CALCIUM 9.4  MG 1.8    GFR: Estimated Creatinine Clearance: 59.5 mL/min (A) (by C-G formula based on SCr of 1.24 mg/dL (H)).  Liver Function Tests: Recent Labs  Lab 12/08/20 1535  AST 32  ALT 43  ALKPHOS 67  BILITOT 0.6  PROT 8.2*  ALBUMIN 4.3    Urine analysis:    Component Value Date/Time   COLORURINE YELLOW 12/08/2020 1847   APPEARANCEUR HAZY (A) 12/08/2020 1847   LABSPEC 1.008 12/08/2020 1847   PHURINE 7.0 12/08/2020 1847   GLUCOSEU NEGATIVE 12/08/2020 1847   HGBUR NEGATIVE 12/08/2020 1847   BILIRUBINUR NEGATIVE 12/08/2020 1847   KETONESUR NEGATIVE 12/08/2020 1847   PROTEINUR NEGATIVE 12/08/2020 1847   UROBILINOGEN 1.0 06/16/2015 1045   NITRITE NEGATIVE 12/08/2020 1847   LEUKOCYTESUR NEGATIVE 12/08/2020 1847    Radiological Exams on Admission: No results found.  EKG: Not yet performed  Assessment/Plan Principal Problem:   Seizure-like activity (HCC) Active Problems:   Hx of Hodgkins lymphoma   History of ductal carcinoma in situ (DCIS) of breast   History of thyroid cancer   Adrenal insufficiency (HCC)   Hypertension   GERD (gastroesophageal reflux disease)   Hyperlipidemia   Migraine   History of pituitary adenoma   History of cervical cancer   Headache   Addison's disease (HCC)   High anion gap metabolic acidosis   Hypokalemia  Seizure-like activity ?  Pseudoseizures > Patient presents with reports of several months of intermittent  unresponsive episodes most recently today.  She was unresponsive but not unconscious. > There is some concern for possible pseudoseizures given her high level of anxiety and atypical presentation.  She also did appear to be having some purposeful facial twitches and eye twitches during patient interview.  No tonic-clonic activity. > Neurology has consulted and patient is to be admitted to [facility] for continuous EEG video monitoring to capture the described events to determine whether this is pseudoseizure versus seizure. - Appreciate neurology recommendations - Patient will need follow-up with psychiatry or/and psychology as an outpatient - We will continue home meds including topiramate and benzos - Continuous EEG has been ordered by neurology team  History of cancer including brain lesion requiring excision leading to Addison's disease and adrenal insufficiency. > There was some initial  concern for possible adrenal insufficiency crisis given recent dental procedure and lack of taper following.  However her blood pressure is stable here and her presentation is more consistent with seizure or pseudoseizure as above. > Did receive 1 dose of 100 mg hydrocortisone in ED.  Nlood pressures remained stable, no other obvious signs of adrenal crisis. - 50 mg p.o. hydrocortisone tonight and tomorrow morning, may benefit from a taper both for prevention of crisis and for reassurance (home dose of 20 in the morning and 10 in the evening) - Follow-up ACTH, aldosterone renin, cortisol levels  Hypokalemia Anion gap metabolic acidosis > Potassium of 3.3 in ED.  Bicarb 21, gap 17.  Magnesium level normal. > Acidosis likely related to mildly elevated lactate of 2.3. - Monitor repeat lactic acid after receiving IV fluids. - 40 mEq p.o. potassium - Monitor electrolytes and renal function  Migraine headache - Continue home Topamax, - Replace home med with formulary rimegepant sulfate as needed  GERD -  Continue PPI  Asthma -Continue home Qvar, Breo Ellipta, as needed albuterol  Hyperlipidemia -Continue home rosuvastatin  Hypertension - Continue home Bumex and metoprolol  Thyroid disease > Is post thyroid cancer - Continue home Synthroid  DVT prophylaxis: Lovenox  Code Status:   Full  Family Communication:  Husband updated at bedside  Disposition Plan:   Patient is from:  Home  Anticipated DC to:  Home  Anticipated DC date:  1 to 2 days  Anticipated DC barriers: None  Consults called:  Neurology, Dr. Erlinda Hong Admission status:  Observation, telemetry   Severity of Illness: The appropriate patient status for this patient is OBSERVATION. Observation status is judged to be reasonable and necessary in order to provide the required intensity of service to ensure the patient's safety. The patient's presenting symptoms, physical exam findings, and initial radiographic and laboratory data in the context of their medical condition is felt to place them at decreased risk for further clinical deterioration. Furthermore, it is anticipated that the patient will be medically stable for discharge from the hospital within 2 midnights of admission. The following factors support the patient status of observation.   " The patient's presenting symptoms include weakness, episode of unresponsiveness, anxiety. " The physical exam findings include murmur, tearful on exam, facial twitching. " The initial radiographic and laboratory data are significant for stable hemoglobin 11.9, potassium of 3.3, bicarb 21, gap 17, lactic acid 2.3 on first check recheck pending.  Marcelyn Bruins MD [facility]  How to contact the [facility] Attending or Consulting provider [place] or covering provider during after hours [place], for this patient?   1. Check the care team in [facility] and look for a) attending/consulting [facility] provider listed and b) the [facility] team listed 2. Log into [email or web address] and use [place]'s universal  password to access. If you do not have the password, please contact the hospital operator. 3. Locate the [facility] provider you are looking for under [facility] and page to a number that you can be directly reached. 4. If you still have difficulty reaching the provider, please page the [facility] (Director on Call) for the Hospitalists listed on amion for assistance.  12/08/2020, 8:19 PM

## 2020-12-08 NOTE — Consult Note (Addendum)
[facility] Telemedicine Consult  Consult Requested by: Dr. Kathrynn Humble  Reason for Consult: seizure  Consult Date: 12/08/20   History of Present Illness:  [name] is a 50 y.o. 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 She is on cortef at home.   Per husband, pt woke up this morning, did not feel well, more tiredness and fatigue. Then developed spells of not responding to husband, but eyes open, did not pass out, may have stared off but not quite sure. No arm leg shaking jerking. She was sent to ER for evaluation. Husband stated that pt had previous adrenal crisis, seems to have similar presentation. Pt had dental procedure on 3/7 for tooth extraction concerning for abscess. She received stress dose of steroids. Per husband, she did not receive taper dose of steroids and she has not eat well for the last week, feeling nausea, not sure how good she has been taking the daily cortef.   Per husband, 3-4 weeks ago, pt woke up in the middle of the night, had b/l UE shaking, like seizure, lasting 2 min or so, and then second day she had fever on woke up. This happened twice but during the day pt was normal. She seemed to have seizures in 2009 with pituitary tumor with not talking and less responding. She was not put on any AEDs.   On Dr. Kathrynn Humble exam, initially pt feeling restless and not comfortable, but orientated, refused blood work. Then when husband walked in, she started to have unresponsiveness, facial twitching, generalized shaking, lasted a couple of minutes and she had some confusion after the movement stopped but back to baseline within 30sec to one min.   On my encounter with her over the tele, when I first beam in, her husband  and RN are at the bedside. She was awake alert, able to greet with me, shortly after, she started to squeezing her eyes one after the other, seems not comfortable, head tends to fall backward, and eyes starting to roll back. When I call her name, she was able to look at me and then closed her eyes. RN did sternal rub, she said "hurts". Then she started to flailing her arms one by one, not in coordinated way, certainly not rhythmic. Then both arms started to mildly shaking at the same time, but intermittently open and close eyes. Trying to get up from bed. Did not notice leg shaking. I tried to calm her down but not effective. So I turned to her husband to ask for more information. Pt gradually calm down and opened her eyes. Then I did my neuro exam, at that time, her exam was normal. There is no postictal period. Per RN, this has happened 7-8 times since in ER today.   Husband told me that pt and family has a lot of stress. Their daughter stopped talking to them since 05/2020, then last month, family was told that daughter had a child, after that daughter again not talking to them. This caused a lot of stress to the patient.    Exam: Vitals:   12/08/20 1630 12/08/20 1700  BP: 130/81 128/84  Pulse: 86 72  Resp: 18 16  Temp:    SpO2: 98% 96%     Temp:  [98.1 F (36.7 C)] 98.1 F (36.7 C) (03/10  1414) Pulse Rate:  [72-88] 72 (03/10 1700) Resp:  [12-23] 16 (03/10 1700) BP: (117-144)/(74-97) 128/84 (03/10 1700) SpO2:  [96 %-100 %] 96 % (03/10 1700) Weight:  [86.2 kg] 86.2 kg (03/10 1415)  General - Obese, well developed, in no apparent distress.  Ophthalmologic - fundi not visualized due to noncooperation.  Cardiovascular - Regular rhythm and rate.  Neuro - after the episode I described above, she is wake, alert, eyes open, orientated to age, place, time and people. No aphasia, fluent language, following all simple commands. Able to name and repeat and read. No gaze palsy, tracking  bilaterally, visual field full. No facial droop. Tongue midline. Bilateral UEs 5/5, no drift. Bilaterally LEs 5/5, no drift. Sensation symmetrical bilaterally, b/l FTN intact, gait not tested.     Imaging Reviewed:  No results found.   Labs reviewed in epic and pertinent values follow: Cre 1.24, K 3.3  Assessment:  50 y.o. 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 In ED, she had 7-8 episodes describe above, more resembles pseudoseizures. Pt does have a lot of stress recent due to family issue.   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 Kathrynn Humble  Neurology team will follow   Consult Participants: RN, me, pt husband and pt Location of the provider: home office Location of the patient: [facility] ED  This consult was provided via telemedicine with 2-way video and audio communication. The patient/family was informed that care would be provided in this way and agreed to receive care in this manner.   This patient is receiving care for possible acute neurological changes. There was 60 minutes of care by this provider at the time of service, including time for direct evaluation via telemedicine, review of medical records, imaging studies and discussion of findings with providers, the patient and/or family.  Rosalin Hawking, MD PhD Stroke Neurology 12/08/2020 6:39  PM

## 2020-12-08 NOTE — ED Notes (Signed)
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

## 2020-12-08 NOTE — ED Triage Notes (Signed)
Pt came from home via EMS. 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 Pt had dental procedure done on Monday, given 100 mg of steroid for procedure, dose was not tapered, triggering Addison's crisis. PMH of 7 different cancers. Implanted port on left chest. No peripheral sticks due to vasculitis. Episodes of crisis intermittent, exhibit gagging, drooling, dizziness, facial spasms.

## 2020-12-08 NOTE — ED Provider Notes (Addendum)
[facility] DEPT Provider Note   CSN: [id number] Arrival date & time: 12/08/20  1346     History No chief complaint on file.   [name] is a 50 y.o. female.  HPI     50 year old female with history of aortic stenosis, Addison's disease with prior complication of adrenal crisis, multiple cancers -including brain tumor status post resection with complication of pituitary damage that led to Addison's comes in with chief complaint of weakness and concerns for adrenal crisis.  Patient reports that she recently had dental procedure for an abscess in the hospital.  She was discharged from the hospital on 3-7.  She had received 1 round of Solu-Cortef or hydrocortisone before the procedure.  She did not go home with any taper.  The night after being discharged, she started feeling slightly off.  Yesterday she started feeling weaker, and today there was an episode of unresponsiveness that led to husband calling EMS.  I spoke with patient's husband who concurs that patient was more tired than usual yesterday.  Today there was an episode of her becoming unresponsive but not unconscious.  There was no generalized tonic-clonic shaking associated with it.  In the past, patient has had adrenal crisis, the last 1 being a year ago.  Typically her symptoms are similar to her current episode of adrenal crisis according to the husband.  Patient reports that the current episode she feels a lot worse.  She typically does not get memory issues with her crisis.  She has history of anxiety, but does not think that she is currently having a panic attack.  Patient does not have any history of epilepsy.  3 or 4 weeks ago she had fevers and associated with them she had 3 episodes of generalized tonic-clonic activity.   Past Medical History:  Diagnosis Date  . Addison's disease ([place])   . Adrenal insufficiency ([place])   . Anemia   . Anxiety   . Aortic stenosis   . Aortic stenosis    . Appendicitis 12/19/09  . Appendicitis   . Breast cancer ([place])    STATUS POST BILATERAL MASTECTOMY. STATUS POST RECONSTRUCTION. SHE HAD SILICONE BREAST IMPLANTS AND THE LEFT IMPLANT IS LEAKING SLIGHTLY  . Cellulitis of right middle finger 11/07/2018  . Cervical cancer ([place]) 12/23/2018  . Chest pain   . CHF with right heart failure ([place]) 04/17/2017  . Chronic respiratory failure with hypoxia ([place]) 12/23/2018  . Cough variant asthma 04/13/2019  . Depression   . GERD (gastroesophageal reflux disease)    takes Dexilant and carafate and gi coctail   . Headache    migraines on a daily and monthly regimen   . Heart murmur   . History of kidney stones   . Hodgkin lymphoma (H[facility])    STATUS POST MANTLE RADIATION  . Hodgkin's lymphoma ([place])    1987  . Hypertension   . Hypoxia   . Necrotizing fasciitis ([place]) 12/23/2018  . Non-ischemic cardiomyopathy ([place])   . Osteoporosis   . Palpitations   . Pituitary adenoma ([place]) 12/23/2018  . Pneumonia   . PONV (postoperative nausea and vomiting)   . Pre-diabetes    per pt; no meds  . Pulmonary hypertension ([place]) 12/23/2018  . Raynaud phenomenon   . Right heart failure ([place]) 04/17/2017  . Seizures ([place])    last febrile seizure was approx 3 weeks ago per report on 12/01/2020  . Sleep apnea    upcoming sleep study per pt   .  Supplemental oxygen dependent    3 liters  . SVT (supraventricular tachycardia) ([place])   . Tachycardia   . Thyroid cancer ([place])    STATUS POST SURGICAL REMOVAL-CURRENT ON THYROID REPLACEMENT    Patient Active Problem List   Diagnosis Date Noted  . AKI (acute kidney injury) ([place]) 04/27/2020  . Vasculopathy 04/27/2020  . Bacteremia 04/19/2020  . Addison's disease ([place])   . Nausea 04/01/2020  . Generalized weakness 02/10/2020  . Headache 02/10/2020  . Subcutaneous nodule 02/01/2020  . Port-A-Cath in place 09/09/2019  . Moderate persistent asthma without complication 08/65/7846  . Fever 07/21/2019  . Superficial thrombophlebitis  07/16/2019  . Injury of left index finger 12/24/2018  . History of pituitary adenoma 12/23/2018  . History of cervical cancer 12/23/2018  . Chronic respiratory failure with hypoxia ([place]) 12/23/2018  . SOB (shortness of breath)   . Nephrolithiasis 11/07/2018  . Oxygen dependent 11/07/2018  . Migraine 11/07/2018  . PVC's (premature ventricular contractions) 10/27/2018  . Hyperlipidemia 03/09/2014  . Hx of Hodgkins lymphoma   . History of ductal carcinoma in situ (DCIS) of breast   . History of thyroid cancer   . Adrenal insufficiency ([place])   . Hypertension   . Raynaud phenomenon   . GERD (gastroesophageal reflux disease)     Past Surgical History:  Procedure Laterality Date  . ABDOMINAL HYSTERECTOMY    . AMPUTATION Left 01/30/2019   Procedure: Left Index finger amputation with flap reconstruction and repair reconstruction;  Surgeon: Roseanne Kaufman, MD;  Location: [facility];  Service: Orthopedics;  Laterality: Left;  . APPENDECTOMY    . breast implants and removal     . breast implants but leaking     . CARDIAC CATHETERIZATION  05/18/09   NORMAL CATH  . COLONOSCOPY    . hx of chemotherapy     . hx of radiation therapy     . I & D EXTREMITY Left 12/23/2018   Procedure: IRRIGATION AND DEBRIDEMENT HAND / INDEX FINGER;  Surgeon: Roseanne Kaufman, MD;  Location: [facility];  Service: Orthopedics;  Laterality: Left;  . IR IMAGING GUIDED PORT INSERTION  05/06/2020  . IR REMOVAL TUN ACCESS W/ PORT W/O FL MOD SED  04/27/2020  . KIDNEY STONE SURGERY    . LUMBAR PUNCTURE W/ INTRATHECAL CHEMOTHERAPY    . MASTECTOMY    . PITUITARY SURGERY    . RIGHT/LEFT HEART CATH AND CORONARY ANGIOGRAPHY N/A 04/02/2018   Procedure: RIGHT/LEFT HEART CATH AND CORONARY ANGIOGRAPHY;  Surgeon: Burnell Blanks, MD;  Location: [facility];  Service: Cardiovascular;  Laterality: N/A;  . TOOTH EXTRACTION N/A 12/05/2020   Procedure: DENTAL RESTORATION/EXTRACTIONS;  Surgeon: Ronal Fear, MD;  Location: [facility];  Service: Oral Surgery;  Laterality: N/A;  . TOTAL THYROIDECTOMY    . VIDEO BRONCHOSCOPY Bilateral 11/14/2018   Procedure: VIDEO BRONCHOSCOPY WITHOUT FLUORO;  Surgeon: Margaretha Seeds, MD;  Location: [facility];  Service: Cardiopulmonary;  Laterality: Bilateral;  . VIDEO BRONCHOSCOPY WITH ENDOBRONCHIAL ULTRASOUND N/A 11/19/2018   Procedure: VIDEO BRONCHOSCOPY WITH ENDOBRONCHIAL ULTRASOUND;  Surgeon: Margaretha Seeds, MD;  Location: [facility];  Service: Thoracic;  Laterality: N/A;     OB History   No obstetric history on file.     Family History  Family history unknown: Yes    Social History   Tobacco Use  . Smoking status: Never Smoker  . Smokeless tobacco: Never Used  Vaping Use  . Vaping Use: Never used  Substance Use Topics  .  Alcohol use: Not Currently    Comment: social   . Drug use: No    Home Medications Prior to Admission medications   Medication Sig Start Date End Date Taking? Authorizing Provider  aspirin 325 MG tablet Take 325 mg by mouth at bedtime.    Yes [provider]  beclomethasone (QVAR) 80 MCG/ACT inhaler Inhale 1 puff into the lungs 2 (two) times daily.    Yes [provider]  bumetanide (BUMEX) 1 MG tablet Take 1 tablet (1 mg total) by mouth 2 (two) times daily. 10/04/20  Yes Nicholas Lose, MD  colchicine 0.6 MG tablet Take 0.6 mg by mouth daily in the afternoon.  12/31/19  Yes [provider]  dexlansoprazole (DEXILANT) 60 MG capsule Take 60 mg by mouth daily.   Yes [provider]  dronabinol (MARINOL) 2.5 MG capsule TAKE 1 CAPSULE (2.5 MG TOTAL) BY MOUTH 2 (TWO) TIMES DAILY AS NEEDED (NAUSEA). Patient taking differently: Take 2.5 mg by mouth at bedtime. 11/09/20  Yes Nicholas Lose, MD  escitalopram (LEXAPRO) 20 MG tablet Take 20 mg by mouth every evening.  04/24/16  Yes [provider]  fluticasone furoate-vilanterol (BREO ELLIPTA) 200-25 MCG/INH AEPB Inhale 1 puff into the lungs daily.   Yes [provider]  Galcanezumab-gnlm 120 MG/ML SOAJ Inject 120 mg into the skin every 28 (twenty-eight) days.  03/31/19  Yes [provider]  heparin lock flush 100 UNIT/ML SOLN injection USE 5 MLS IN PORT A CATH ONCE DAILY AFTER MEDICATION ADMINISTRATION AS A HEPLOCK AS DIRECTED. Patient taking differently: Inject 500 Units into the vein daily. 11/16/20  Yes Nicholas Lose, MD  hydrocortisone (CORTEF) 10 MG tablet Take 1-2 tablets (10-20 mg total) by mouth See admin instructions. Take 20 mg in the am and 35m in the evening Patient taking differently: Take 10-20 mg by mouth See admin instructions. Take 20 mg by mouth in the morning and 181min the evening 02/17/20  Yes Kyle, Tyrone A, DO  levothyroxine (SYNTHROID, LEVOTHROID) 112 MCG tablet Take 112 mcg by mouth daily before breakfast.   Yes [provider]  metoprolol succinate (TOPROL-XL) 25 MG 24 hr tablet Take 37.5 mg by mouth daily.    Yes [provider]  potassium chloride (KLOR-CON) 10 MEQ tablet Take 10 mEq by mouth daily as needed (low potassium).   Yes [provider]  rosuvastatin (CRESTOR) 10 MG tablet Take 10 mg by mouth daily.  02/28/18  Yes [provider]  Sodium Chloride Flush (NORMAL SALINE FLUSH) 0.9 % SOLN FLUSH PORT A CATH WITH 10 ML SODIUM CHLORIDE FLUSH 0.9% BEFORE AND AFTER FLUIDS TO MAINTAIN PATENCY 11/16/20  Yes GuNicholas LoseMD  topiramate (TOPAMAX) 50 MG tablet Take 150 mg by mouth at bedtime.  12/25/19  Yes [provider]  zolpidem (AMBIEN CR) 12.5 MG CR tablet Take 12.5 mg by mouth at bedtime.   Yes [provider]  albuterol (2.5 MG/3ML) 0.083% NEBU 3 mL, albuterol (5 MG/ML) 0.5% NEBU 0.5 mL Inhale 3 mg into the lungs every 6 (six) hours as needed (SOB).     [provider]  albuterol (VENTOLIN HFA) 108 (90 Base) MCG/ACT inhaler Inhale 2 puffs into the lungs every 6 (six) hours as needed for wheezing or shortness of breath.    [provider]   ALPRAZolam (XDuanne Moron0.5 MG tablet Take 0.5 mg by mouth 2 (two) times daily as needed for anxiety. 09/21/20   [provider]  alum & mag hydroxide-simeth (  MAALOX/MYLANTA) 200-200-20 MG/5ML suspension Take 30 mLs by mouth daily as needed for indigestion or heartburn. For Bleeding Ulcers    [provider]  benzonatate (TESSALON) 200 MG capsule Take 1 capsule (200 mg total) by mouth 3 (three) times daily as needed for cough. Patient not taking: No sig reported 04/15/19   Martyn Ehrich, NP  diphenhydrAMINE (BENADRYL) 50 MG/ML injection Inject 1 mL (50 mg total) into the vein every 6 (six) hours as needed (nausea). Patient not taking: No sig reported 12/25/18   Rai, Ripudeep K, MD  EPINEPHRINE 0.3 mg/0.3 mL IJ SOAJ injection INJECT 0.3 MLS (0.3 MG TOTAL) INTO THE MUSCLE AS NEEDED FOR ANAPHYLAXIS. Patient taking differently: Inject 0.3 mg into the muscle daily as needed for anaphylaxis. 12/09/19   Margaretha Seeds, MD  HYDROcodone-acetaminophen (NORCO) 5-325 MG tablet Take 1 tablet by mouth every 6 (six) hours as needed for moderate pain. Patient not taking: No sig reported 06/03/20   Sandi Mealy E., PA-C  LORazepam (ATIVAN) 0.5 MG tablet TAKE 1 TO 2 TABLETS BY MOUTH TWICE A DAY AS NEEDED FOR NAUSEA Patient taking differently: Take 0.5 mg by mouth 2 (two) times daily as needed (nausea). 11/09/20   Nicholas Lose, MD  Mepolizumab (NUCALA) 100 MG SOLR Inject 100 mg into the skin every 28 (twenty-eight) days. Patient not taking: No sig reported 05/13/19   Margaretha Seeds, MD  OXYGEN Inhale 3-4 L into the lungs continuous.    [provider]  potassium chloride (KLOR-CON) 10 MEQ tablet Take 1 tablet (10 mEq total) by mouth daily for 21 days. 07/15/20 08/05/20  Alfredia Client, PA-C  Rimegepant Sulfate (NURTEC) 75 MG TBDP Take 75 mg by mouth daily as needed (Migraine).     [provider]  silver sulfADIAZINE (SILVADENE) 1 % cream Apply 1 application topically daily as needed  (Open Wound).  11/16/19   [provider]  sodium chloride 0.9 % infusion Use as directed. 04/19/20   Nicholas Lose, MD  sucralfate (CARAFATE) 1 g tablet Take 1 g by mouth daily as needed (FOR ULCERS).     [provider]    Allergies    Ferrous bisglycinate chelate [iron], Mushroom extract complex, Na ferric gluc cplx in sucrose, Cymbalta [duloxetine hcl], Succinylcholine, Buprenorphine hcl, Compazine, Metoclopramide, Morphine and related, Ondansetron, Promethazine hcl, and Tegaderm ag mesh [silver]  Review of Systems   Review of Systems  Constitutional: Positive for activity change and fatigue. Negative for fever.  Respiratory: Negative for shortness of breath.   Cardiovascular: Negative for chest pain.  Gastrointestinal: Negative for abdominal pain, nausea and vomiting.  Allergic/Immunologic: Positive for immunocompromised state.  Neurological: Positive for seizures.  All other systems reviewed and are negative.   Physical Exam Updated Vital Signs BP 128/84   Pulse 72   Temp 98.1 F (36.7 C) (Oral)   Resp 16   Ht 5' 5" (1.651 m)   Wt 86.2 kg   LMP  (LMP Unknown)   SpO2 96%   BMI 31.62 kg/m   Physical Exam Vitals and nursing note reviewed.  Constitutional:      Appearance: She is well-developed.  HENT:     Head: Normocephalic and atraumatic.  Eyes:     Extraocular Movements: Extraocular movements intact.     Pupils: Pupils are equal, round, and reactive to light.  Cardiovascular:     Rate and Rhythm: Normal rate.  Pulmonary:     Effort: Pulmonary effort is normal.  Abdominal:  General: Bowel sounds are normal.  Musculoskeletal:     Cervical back: Normal range of motion and neck supple.  Skin:    General: Skin is warm and dry.  Neurological:     Mental Status: She is alert and oriented to person, place, and time.     Cranial Nerves: No cranial nerve deficit.     Motor: No weakness.     ED Results / Procedures / Treatments   Labs (all  labs ordered are listed, but only abnormal results are displayed) Labs Reviewed  COMPREHENSIVE METABOLIC PANEL - Abnormal; Notable for the following components:      Result Value   Potassium 3.3 (*)    CO2 21 (*)    Glucose, Bld 113 (*)    Creatinine, Ser 1.24 (*)    Total Protein 8.2 (*)    GFR, Estimated 53 (*)    Anion gap 17 (*)    All other components within normal limits  CBC WITH DIFFERENTIAL/PLATELET - Abnormal; Notable for the following components:   Hemoglobin 11.9 (*)    All other components within normal limits  LACTIC ACID, PLASMA - Abnormal; Notable for the following components:   Lactic Acid, Venous 2.3 (*)    All other components within normal limits  CULTURE, BLOOD (ROUTINE X 2)  CULTURE, BLOOD (ROUTINE X 2)  SARS CORONAVIRUS 2 (TAT 6-24 HRS)  MAGNESIUM  URINALYSIS, ROUTINE W REFLEX MICROSCOPIC  BLOOD GAS, VENOUS  CORTISOL  ACTH  ALDOSTERONE + RENIN ACTIVITY W/ RATIO    EKG None  Radiology No results found.  Procedures .Critical Care Performed by: Varney Biles, MD Authorized by: Varney Biles, MD   Critical care provider statement:    Critical care time (minutes):  60   Critical care was necessary to treat or prevent imminent or life-threatening deterioration of the following conditions:  Endocrine crisis and CNS failure or compromise   Critical care was time spent personally by me on the following activities:  Discussions with consultants, evaluation of patient's response to treatment, examination of patient, ordering and performing treatments and interventions, ordering and review of laboratory studies, ordering and review of radiographic studies, pulse oximetry, re-evaluation of patient's condition, obtaining history from patient or surrogate and review of old charts     Medications Ordered in ED Medications  midazolam (VERSED) 2 MG/2ML injection (  Canceled Entry 12/08/20 1640)  lactated ringers bolus 1,000 mL (1,000 mLs Intravenous New  Bag/Given 12/08/20 1535)  hydrocortisone sodium succinate (SOLU-CORTEF) 100 MG injection 100 mg (100 mg Intravenous Given 12/08/20 1538)  sodium chloride 0.9 % bolus 1,000 mL (1,000 mLs Intravenous New Bag/Given 12/08/20 1537)  midazolam (VERSED) injection 1 mg (1 mg Intravenous Given 12/08/20 1538)  midazolam (VERSED) injection 1 mg (1 mg Intravenous Given 12/08/20 1544)  diphenhydrAMINE (BENADRYL) injection 25 mg (25 mg Intravenous Not Given 12/08/20 1546)  midazolam (VERSED) injection 2 mg (2 mg Intravenous Given 12/08/20 1639)    ED Course  I have reviewed the triage vital signs and the nursing notes.  Pertinent labs & imaging results that were available during my care of the patient were reviewed by me and considered in my medical decision making (see chart for details).  Clinical Course as of 12/08/20 1830  Thu Dec 08, 2020  1600 Within minutes of assessment, patient started complaining of feeling restless and nursing staff requested my assessment.  Patient appeared uncomfortable.  She was oriented, alert and answering questions.  We have added Versed 1 mg and 1  mg as needed.  Patient has requested not to get blood stick, instead to just use report.  Understands the limitation of diagnostic work-up, specifically blood cultures will not be drawn. [AN]  1700 Patient had an episode of unresponsiveness.  When I assessed her she had a facial twitch, she started having generalized shaking.  Of note, right before the onset of the symptoms, patient's husband had just walked in.  She was confused after the episode stopped, but back to baseline normal within a matter of 30 seconds to a minute.  I will consult neurology.  Her symptoms are not clearly consistent to adrenal crisis based on the lab work-up and hemodynamic stability.  It does not appear that she is having panic attack based on what she is telling us about her prior panic attacks.  It is possible that she could be having nonepileptic  seizures versus organic seizures.  Neuro consulted for it. [AN]  1829 Neuro suspects nonepileptic seizures.  They have requested transfer to [facility] for a longer EEG study. [AN]    Clinical Course User Index [AN] Varney Biles, MD   MDM Rules/Calculators/A&P                          50 year old female with history of Addison's disease comes in a chief complaint of weakness, altered mental status.  It appears that patient had an episode of unresponsiveness prior to ED arrival.  She is 3 days postop from a dental procedure, and it seems like she was not given a taper of steroids after discharge.  Patient informs me that she felt weak  -and in general she felt similar to her adrenal crisis that she has had in the past, but much worse this time around.  There are also some new symptoms such as amnesia, unresponsiveness.  Confounding further is the fact that about a month ago she had 3 episodes of generalized tonic-clonic activity that was deemed to be febrile seizure as it was associated with fevers.  Plan is to get basic labs, start fluid bolus, get cortisol level and reassess.  Final Clinical Impression(s) / ED Diagnoses Final diagnoses:  Seizure-like activity ([place])  Altered mental status, unspecified altered mental status type  Addison's disease [facility])    Rx / DC Orders ED Discharge Orders    None       Varney Biles, MD 12/08/20 [place], [place], MD 12/08/20 1830

## 2020-12-09 ENCOUNTER — Encounter (HOSPITAL_COMMUNITY): Payer: Self-pay | Admitting: Internal Medicine

## 2020-12-09 DIAGNOSIS — E271 Primary adrenocortical insufficiency: Secondary | ICD-10-CM

## 2020-12-09 DIAGNOSIS — R569 Unspecified convulsions: Secondary | ICD-10-CM | POA: Diagnosis not present

## 2020-12-09 DIAGNOSIS — F331 Major depressive disorder, recurrent, moderate: Secondary | ICD-10-CM | POA: Diagnosis not present

## 2020-12-09 DIAGNOSIS — E876 Hypokalemia: Secondary | ICD-10-CM | POA: Diagnosis not present

## 2020-12-09 DIAGNOSIS — F411 Generalized anxiety disorder: Secondary | ICD-10-CM

## 2020-12-09 DIAGNOSIS — E2740 Unspecified adrenocortical insufficiency: Secondary | ICD-10-CM | POA: Diagnosis not present

## 2020-12-09 LAB — COMPREHENSIVE METABOLIC PANEL
ALT: 32 U/L (ref 0–44)
AST: 23 U/L (ref 15–41)
Albumin: 3.4 g/dL — ABNORMAL LOW (ref 3.5–5.0)
Alkaline Phosphatase: 57 U/L (ref 38–126)
Anion gap: 8 (ref 5–15)
BUN: 15 mg/dL (ref 6–20)
CO2: 28 mmol/L (ref 22–32)
Calcium: 8.8 mg/dL — ABNORMAL LOW (ref 8.9–10.3)
Chloride: 103 mmol/L (ref 98–111)
Creatinine, Ser: 1.12 mg/dL — ABNORMAL HIGH (ref 0.44–1.00)
GFR, Estimated: >60 mL/min (ref >60)
Glucose, Bld: 94 mg/dL (ref 70–99)
Potassium: 3.3 mmol/L — ABNORMAL LOW (ref 3.5–5.1)
Sodium: 139 mmol/L (ref 135–145)
Total Bilirubin: 0.7 mg/dL (ref 0.3–1.2)
Total Protein: 6.5 g/dL (ref 6.5–8.1)

## 2020-12-09 LAB — CBC
HCT: 32.8 % — ABNORMAL LOW (ref 36.0–46.0)
Hemoglobin: 10.2 g/dL — ABNORMAL LOW (ref 12.0–15.0)
MCH: 27.2 pg (ref 26.0–34.0)
MCHC: 31.1 g/dL (ref 30.0–36.0)
MCV: 87.5 fL (ref 80.0–100.0)
Platelets: 313 10*3/uL (ref 150–400)
RBC: 3.75 MIL/uL — ABNORMAL LOW (ref 3.87–5.11)
RDW: 15.3 % (ref 11.5–15.5)
WBC: 8.5 10*3/uL (ref 4.0–10.5)
nRBC: 0 % (ref 0.0–0.2)

## 2020-12-09 LAB — SARS CORONAVIRUS 2 (TAT 6-24 HRS): SARS Coronavirus 2: NEGATIVE

## 2020-12-09 LAB — ACTH: C206 ACTH: 14.5 pg/mL (ref 7.2–63.3)

## 2020-12-09 MED ORDER — DIPHENHYDRAMINE HCL 50 MG/ML IJ SOLN: 50.0000 mg | Freq: Three times a day (TID) | INTRAMUSCULAR | Status: DC | PRN

## 2020-12-09 MED ORDER — POTASSIUM CHLORIDE CRYS ER 20 MEQ PO TBCR: 40.0000 meq | EXTENDED_RELEASE_TABLET | Freq: Once | ORAL | Status: DC

## 2020-12-09 MED ORDER — LACTATED RINGERS IV BOLUS
1000.0000 mL | Freq: Once | INTRAVENOUS | Status: AC
  Administered 2020-12-09: 1000 mL via INTRAVENOUS

## 2020-12-09 MED ORDER — DEXLANSOPRAZOLE 60 MG PO CPDR
60.0000 mg | DELAYED_RELEASE_CAPSULE | Freq: Every day | ORAL | Status: DC
  Administered 2020-12-09: 60 mg via ORAL
  Filled 2020-12-09: qty 1

## 2020-12-09 MED ORDER — DIPHENHYDRAMINE HCL 25 MG PO CAPS
50.0000 mg | ORAL_CAPSULE | Freq: Three times a day (TID) | ORAL | Status: DC | PRN
  Filled 2020-12-09 (×2): qty 2

## 2020-12-09 MED ORDER — DIPHENHYDRAMINE HCL 50 MG/ML IJ SOLN
12.5000 mg | Freq: Three times a day (TID) | INTRAMUSCULAR | Status: DC | PRN
  Administered 2020-12-09: 12.5 mg via INTRAVENOUS
  Filled 2020-12-09 (×2): qty 1

## 2020-12-09 MED ORDER — CHLORHEXIDINE GLUCONATE CLOTH 2 % EX PADS: 6.0000 | MEDICATED_PAD | Freq: Every day | CUTANEOUS | Status: DC

## 2020-12-09 MED ORDER — SUCRALFATE 1 G PO TABS
1.0000 g | ORAL_TABLET | Freq: Every day | ORAL | Status: DC | PRN
  Filled 2020-12-09: qty 1

## 2020-12-09 NOTE — Progress Notes (Signed)
Patient called out asking for assistance due to nausea and headache. Prior to entering the room, this nurse addressed with patient's nurse if they could have any prn's and if these were new symptoms. MAR shows only PO meds and pt refusing all PO meds. The only medication patient took recently was her Dexilant for GERD. Explained this situation to patient and also that MD and pharmacy were both made aware. Offered fan and patient sitting up in bed. Patient reluctant to take any PO meds or even hear about them. Patient stated "she only takes these medications IV at home and refuses to take pills". Continued to offer support and explain to patient that no IV medications are ordered. Will continue to monitor pt closely. Leanne Chang, RN

## 2020-12-09 NOTE — Procedures (Addendum)
Patient Name: [name]  MRN: [id number]  Epilepsy Attending: Lora Havens  Referring Physician/Provider: Dr. Neva Seat Duration: 12/08/2020 2240 to 12/09/2020 1830  Patient history: 50 year old female with history of pituitary tumor status post resection presented with seizure-like episodes.  EEG to evaluate for seizures.  Level of alertness: Awake, asleep  AEDs during EEG study: Topiramate  Technical aspects: This EEG study was done with scalp electrodes positioned according to the 10-20 International system of electrode placement. Electrical activity was acquired at a sampling rate of _0  and reviewed with a high frequency filter of _1  and a low frequency filter of _2 . EEG data were recorded continuously and digitally stored.   Description: The posterior dominant rhythm consists of 9-10 Hz activity of moderate voltage (25-35 uV) seen predominantly in posterior head regions, symmetric and reactive to eye opening and eye closing. Sleep was characterized by vertex waves, sleep spindles (12 to 14 Hz), maximal frontocentral region.  Hyperventilation and photic stimulation were not performed.     Event button was pressed on 12/09/2020 at 0300 and 1513 during which patient was noted to be tensing up in the bed, reported muscle spasms. Concomitant EEG before, during and after the study showed normal posterior dominant rhythm.   IMPRESSION: This study is within normal limits. No seizures or epileptiform discharges were seen throughout the recording.  Two events were recorded on 12/09/2020 as described above without concomitant EEG change and were NON-epileptic events.  Jolanta Cabeza Barbra Sarks

## 2020-12-09 NOTE — Progress Notes (Signed)
Patient had episode of "seizure like activity",  Writer at bedside. Able to speak during episode. No change in vital signs.

## 2020-12-09 NOTE — Progress Notes (Signed)
[facility]  PROGRESS NOTE  [name] PIR:[id number] DOB: [birth year] DOA: 12/08/2020 PCP: Nicholas Lose, MD   Brief HPI:   50 year old female with medical history of adrenal insufficiency, chronic respiratory failure on home O2, GERD, history of cancer including cervical, breast, Hodgkin's with [place] lesion resection leading to adrenal insufficiency, hyperlipidemia, hypertension, asthma, aortic stenosis who presented with seizure-like episode.  She recently had dental procedure at that time she had stress dose steroid of Solu-Cortef 100 mg IV x1 due to adrenal insufficiency.  She did not have taper at discharge.  The night after she was discharged she felt a little fatigued with anesthesia.  Then she began to feel weaker.  Today she had episode of unresponsive at home and was brought to the hospital.  Neurology was consulted and there was concern for possible seizure versus pseudoseizure and she was transferred to [facility] for LTM EEG monitoring.    Subjective   Patient seen and examined, getting LTM EEG.  Complains of nausea wants her IV Benadryl which she takes at home as needed for nausea.  Patient has port a cath  for  getting IV medications.   Assessment/Plan:     1. Seizure-like activity/pseudoseizure-patient has been having episodes of intermittent unresponsiveness at home.  There was no tonic-clonic activity noted.  Neurology was consulted, currently LTM EEG monitoring recommended.  We will also consult psychiatry for further recommendations. 2. History of adrenal insufficiency-there was concern for possible adrenal crisis given recent dental procedure and lack of tapering of steroids.  Patient blood pressure is stable, she received 1 dose of 100 mg hydrocortisone in the ED.  Currently she is on hydrocortisone 50 mg daily, plan to switch her to home dose of hydrocortisone 20 mg in the morning and 10 mg in the evening from 12/10/2020. 3. Hypokalemia-replace potassium and  follow BMP in am. 4. Migraine headache-continue Topamax 5. Asthma-continue Qvar, Breo Ellipta, as needed albuterol 6. Hypothyroidism-s/p thyroid surgery, continue Synthroid 7. Hypertension-continue Bumex, metoprolol     COVID-19 Labs  No results for input(s): DDIMER, FERRITIN, LDH, CRP in the last 72 hours.  Lab Results  Component Value Date   SARSCOV2NAA NEGATIVE 12/08/2020   SARSCOV2NAA NEGATIVE 12/01/2020   [place] NEGATIVE 04/26/2020   [place] NEGATIVE 04/01/2020     Scheduled medications:   . acetaminophen  1,000 mg Oral Once  . aspirin  325 mg Oral QHS  . budesonide  0.5 mg Inhalation BID  . bumetanide  1 mg Oral BID  . Chlorhexidine Gluconate Cloth  6 each Topical Daily  . colchicine  0.6 mg Oral Q1500  . dexlansoprazole  60 mg Oral Daily  . dextromethorphan-guaiFENesin  1 tablet Oral BID  . enoxaparin (LOVENOX) injection  40 mg Subcutaneous Q24H  . escitalopram  20 mg Oral QPM  . fluticasone furoate-vilanterol  1 puff Inhalation Daily  . hydrocortisone  10 mg Oral QHS  . [START ON 12/10/2020] hydrocortisone  20 mg Oral Daily  . hydrocortisone  50 mg Oral Daily  . ketorolac  15 mg Intravenous Once  . levothyroxine  112 mcg Oral QAC breakfast  . metoprolol succinate  37.5 mg Oral Daily  . potassium chloride  40 mEq Oral Once  . rosuvastatin  10 mg Oral Daily  . sodium chloride flush  3 mL Intravenous Q12H  . topiramate  150 mg Oral QHS         CBG: Recent Labs  Lab 12/05/20 1424  GLUCAP 132*    SpO2: 100 %  CBC: Recent Labs  Lab 12/08/20 1535 12/09/20 0530  WBC 9.6 8.5  NEUTROABS 5.8  --   HGB 11.9* 10.2*  HCT 38.2 32.8*  MCV 86.2 87.5  PLT 350 683    Basic Metabolic Panel: Recent Labs  Lab 12/08/20 1535 12/09/20 0530  NA 141 139  K 3.3* 3.3*  CL 103 103  CO2 21* 28  GLUCOSE 113* 94  BUN 19 15  CREATININE 1.24* 1.12*  CALCIUM 9.4 8.8*  MG 1.8  --      Liver Function Tests: Recent Labs  Lab 12/08/20 1535  12/09/20 0530  AST 32 23  ALT 43 32  ALKPHOS 67 57  BILITOT 0.6 0.7  PROT 8.2* 6.5  ALBUMIN 4.3 3.4*     Antibiotics: Anti-infectives (From admission, onward)   None       DVT prophylaxis: Lovenox  Code Status: Full code  Family Communication: No family at bedside   Consultants:    Procedures:      Objective   Vitals:   12/09/20 0559 12/09/20 0752 12/09/20 1113 12/09/20 1607  BP:  132/87 115/72 125/75  Pulse:  94 81 90  Resp:  16 15 (!) 21  Temp:  97.7 F (36.5 C) 98.4 F (36.9 C) 98.6 F (37 C)  TempSrc:  Oral Oral Oral  SpO2:  100% 100% 100%  Weight: 88 kg     Height: _0  (1.651 m)       Intake/Output Summary (Last 24 hours) at 12/09/2020 1811 Last data filed at 12/09/2020 0600 Gross per 24 hour  Intake 720 ml  Output -  Net 720 ml    03/09 1901 - 03/11 0700 In: 720 [P.O.:720] Out: -   Filed Weights   12/08/20 1415 12/09/20 0559  Weight: 86.2 kg 88 kg    Physical Examination:   General-appears in no acute distress Heart-S1-S2, regular, no murmur auscultated Lungs-clear to auscultation bilaterally, no wheezing or crackles auscultated Abdomen-soft, nontender, no organomegaly Extremities-no edema in the lower extremities Neuro-alert, oriented x3, no focal deficit noted  Status is: Inpatient  Dispo: The patient is from: Home              Anticipated d/c is to: Home              Anticipated d/c date is: 12/13/2020              Patient currently not stable for discharge  Barrier to discharge-ongoing evaluation for seizure versus pseudoseizure            Data Reviewed:   Recent Results (from the past 240 hour(s))  SARS CORONAVIRUS 2 (TAT 6-24 HRS) Nasopharyngeal Nasopharyngeal Swab     Status: None   Collection Time: 12/01/20  2:53 PM   Specimen: Nasopharyngeal Swab  Result Value Ref Range Status   SARS Coronavirus 2 NEGATIVE NEGATIVE Final    Comment: (NOTE) SARS-CoV-2 target nucleic acids are NOT DETECTED.  The  SARS-CoV-2 RNA is generally detectable in upper and lower respiratory specimens during the acute phase of infection. Negative results do not preclude SARS-CoV-2 infection, do not rule out co-infections with other pathogens, and should not be used as the sole basis for treatment or other patient management decisions. Negative results must be combined with clinical observations, patient history, and epidemiological information. The expected result is Negative.  Fact Sheet for Patients: SugarRoll.be  Fact Sheet for Healthcare Providers: https://www.woods-mathews.com/  This test is not yet approved or cleared by the [place] FDA  and  has been authorized for detection and/or diagnosis of SARS-CoV-2 by FDA under an Emergency Use Authorization (EUA). This EUA will remain  in effect (meaning this test can be used) for the duration of the COVID-19 declaration under Se ction 564(b)(1) of the Act, 21 U.S.C. section 360bbb-3(b)(1), unless the authorization is terminated or revoked sooner.  Performed at [facility], [place] [address], [place], [place] [address]   Culture, blood (routine x 2)     Status: None (Preliminary result)   Collection Time: 12/08/20  3:35 PM   Specimen: Site Not Specified; Blood  Result Value Ref Range Status   Specimen Description   Final    SITE NOT SPECIFIED Performed at [facility] [address] [place], [place] [address]    Special Requests   Final    BOTTLES DRAWN AEROBIC AND ANAEROBIC Blood Culture adequate volume Performed at [facility] [address], [place], [place] [address]    Culture   Final    NO GROWTH < 24 HOURS Performed at [facility] [address] [place], [place] [address]    Report Status PENDING  Incomplete  SARS CORONAVIRUS 2 (TAT 6-24 HRS) Nasopharyngeal Nasopharyngeal Swab     Status: None   Collection Time: 12/08/20  3:35 PM    Specimen: Nasopharyngeal Swab  Result Value Ref Range Status   SARS Coronavirus 2 NEGATIVE NEGATIVE Final    Comment: (NOTE) SARS-CoV-2 target nucleic acids are NOT DETECTED.  The SARS-CoV-2 RNA is generally detectable in upper and lower respiratory specimens during the acute phase of infection. Negative results do not preclude SARS-CoV-2 infection, do not rule out co-infections with other pathogens, and should not be used as the sole basis for treatment or other patient management decisions. Negative results must be combined with clinical observations, patient history, and epidemiological information. The expected result is Negative.  Fact Sheet for Patients: SugarRoll.be  Fact Sheet for Healthcare Providers: https://www.woods-mathews.com/  This test is not yet approved or cleared by the [place] FDA and  has been authorized for detection and/or diagnosis of SARS-CoV-2 by FDA under an Emergency Use Authorization (EUA). This EUA will remain  in effect (meaning this test can be used) for the duration of the COVID-19 declaration under Se ction 564(b)(1) of the Act, 21 U.S.C. section 360bbb-3(b)(1), unless the authorization is terminated or revoked sooner.  Performed at [facility], [place] [address] [place], [place] [address]   Culture, blood (routine x 2)     Status: None (Preliminary result)   Collection Time: 12/08/20 10:39 PM   Specimen: BLOOD  Result Value Ref Range Status   Specimen Description BLOOD PIC LINE  Final   Special Requests   Final    BOTTLES DRAWN AEROBIC AND ANAEROBIC Blood Culture adequate volume   Culture   Final    NO GROWTH < 12 HOURS Performed at [facility], [place] [address], [place], [place] [address]    Report Status PENDING  Incomplete    No results for input(s): LIPASE, AMYLASE in the last 168 hours. No results for input(s): AMMONIA in the last 168 hours.  Cardiac Enzymes: No results for  input(s): CKTOTAL, CKMB, CKMBINDEX, TROPONINI in the last 168 hours. BNP (last 3 results) Recent Labs    02/11/20 0530 04/01/20 1517  BNP 90.6 29.6    ProBNP (last 3 results) No results for input(s): PROBNP in the last 8760 hours.  Studies:  No  results found.     Oswald Hillock   [facility] If 7PM-7AM, please contact night-coverage at [email or web address], Office  [phone]   12/09/2020, 6:11 PM  LOS: 0 days

## 2020-12-09 NOTE — Consult Note (Signed)
[name] Face-to-Face Psychiatry Consult   Reason for Consult: Depressed mood, anxiety, nonepileptiform seizures  Referring Physician:  Eleonore Chiquito, MD  Patient Identification: [name] MRN:  [id number] Principal Diagnosis: Seizure-like activity ([place]) Diagnosis:  Principal Problem:   Seizure-like activity ([place]) Active Problems:   Hx of Hodgkins lymphoma   History of ductal carcinoma in situ (DCIS) of breast   History of thyroid cancer   Adrenal insufficiency (HCC)   Hypertension   GERD (gastroesophageal reflux disease)   Hyperlipidemia   Migraine   History of pituitary adenoma   History of cervical cancer   Headache   Addison's disease ([place])   High anion gap metabolic acidosis   Hypokalemia   Total Time spent with patient: 1 hour  HPI:   [name] is a 50 y.o. female with PMHx of renal insufficiency, chronic respiratory failure, GERD, headache, history of cancer including cervical, breast, Hodgkin's with pituitary lesion resection leading to adrenal insufficiency, hyperlipidemia, hypertension, asthma, aortic stenosis, depression, anxiety presented to [facility] ED for seizure-like episode.  She was on continuous EEG and no epileptiform seizure-like activity was noticed and it might be?  Nonepileptiform seizures.  Psychiatry is consulted for evaluation of mood, anxiety and nonepileptiform seizures. Today patient states she is extremely frustrated with the care she is receiving at [facility].  She feels depressed as she has multiple ongoing stressor.  She explains her daughter who recently stopped talking to her and has cut her off from her life.  Even when her daughter had a new born, would not involve her mother in their life and that is pretty distressing for the patient.  Patient has a long history of major depressive disorder, anxiety.  She states she has been taking Lexapro from years and it works great for her but recently her provider stopped prescribing for her Lexapro,  Klonopin and Ambien and that is why she is looking for an outpatient psychiatrist.  She adds that in November 2021 her provider changed her Lexapro to Zoloft 100 mg, her Klonopin to Xanax and Ambien to trazodone all at one point and that lead to deterioration in her mental health and she would like to go back to all combination that was working for her which is Lexapro and Ambien.   Past Psychiatric History: Major depressive disorder, generalized anxiety disorder  Risk to Self:  No Risk to Others:  No Prior Inpatient Therapy:  No Prior Outpatient Therapy:  Yes  Past Medical History:  Past Medical History:  Diagnosis Date  . 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 Adrenal insufficiency ([place])   . Anemia   . Anxiety   . Aortic stenosis   . Aortic stenosis   . Appendicitis 12/19/09  . Appendicitis   . Breast cancer ([place])    STATUS POST BILATERAL MASTECTOMY. STATUS POST RECONSTRUCTION. SHE HAD SILICONE BREAST IMPLANTS AND THE LEFT IMPLANT IS LEAKING SLIGHTLY  . Cellulitis of right middle finger 11/07/2018  . Cervical cancer ([place]) 12/23/2018  . Chest pain   . CHF with right heart failure ([place]) 04/17/2017  . Chronic respiratory failure with hypoxia ([place]) 12/23/2018  . Cough variant asthma 04/13/2019  . Depression   . GERD (gastroesophageal reflux disease)    takes Dexilant and carafate and gi coctail   . Headache    migraines on a daily and monthly regimen   . Heart murmur   . History of kidney stones   . Hodgkin lymphoma (HCC)    STATUS POST MANTLE RADIATION  .  Hodgkin's lymphoma ([place])    1987  . Hypertension   . Hypoxia   . Necrotizing fasciitis ([place]) 12/23/2018  . Non-ischemic cardiomyopathy ([place])   . Osteoporosis   . Palpitations   . Pituitary adenoma ([place]) 12/23/2018  . Pneumonia   . PONV (postoperative nausea and vomiting)   . Pre-diabetes    per pt; no meds  . Pulmonary hypertension ([place]) 12/23/2018  . Raynaud phenomenon   . Right heart failure ([place]) 04/17/2017  . Seizures ([place])     last febrile seizure was approx 3 weeks ago per report on 12/01/2020  . Sleep apnea    upcoming sleep study per pt   . Supplemental oxygen dependent    3 liters  . SVT (supraventricular tachycardia) ([place])   . Tachycardia   . Thyroid cancer ([place])    STATUS POST SURGICAL REMOVAL-CURRENT ON THYROID REPLACEMENT    Past Surgical History:  Procedure Laterality Date  . ABDOMINAL HYSTERECTOMY    . AMPUTATION Left 01/30/2019   Procedure: Left Index finger amputation with flap reconstruction and repair reconstruction;  Surgeon: Roseanne Kaufman, MD;  Location: [facility];  Service: Orthopedics;  Laterality: Left;  . APPENDECTOMY    . breast implants and removal     . breast implants but leaking     . CARDIAC CATHETERIZATION  05/18/09   NORMAL CATH  . COLONOSCOPY    . hx of chemotherapy     . hx of radiation therapy     . I & D EXTREMITY Left 12/23/2018   Procedure: IRRIGATION AND DEBRIDEMENT HAND / INDEX FINGER;  Surgeon: Roseanne Kaufman, MD;  Location: [facility];  Service: Orthopedics;  Laterality: Left;  . IR IMAGING GUIDED PORT INSERTION  05/06/2020  . IR REMOVAL TUN ACCESS W/ PORT W/O FL MOD SED  04/27/2020  . KIDNEY STONE SURGERY    . LUMBAR PUNCTURE W/ INTRATHECAL CHEMOTHERAPY    . MASTECTOMY    . PITUITARY SURGERY    . RIGHT/LEFT HEART CATH AND CORONARY ANGIOGRAPHY N/A 04/02/2018   Procedure: RIGHT/LEFT HEART CATH AND CORONARY ANGIOGRAPHY;  Surgeon: Burnell Blanks, MD;  Location: [facility];  Service: Cardiovascular;  Laterality: N/A;  . TOOTH EXTRACTION N/A 12/05/2020   Procedure: DENTAL RESTORATION/EXTRACTIONS;  Surgeon: Ronal Fear, MD;  Location: [facility];  Service: Oral Surgery;  Laterality: N/A;  . TOTAL THYROIDECTOMY    . VIDEO BRONCHOSCOPY Bilateral 11/14/2018   Procedure: VIDEO BRONCHOSCOPY WITHOUT FLUORO;  Surgeon: Margaretha Seeds, MD;  Location: [facility];  Service: Cardiopulmonary;  Laterality: Bilateral;  . VIDEO BRONCHOSCOPY WITH ENDOBRONCHIAL ULTRASOUND N/A  11/19/2018   Procedure: VIDEO BRONCHOSCOPY WITH ENDOBRONCHIAL ULTRASOUND;  Surgeon: Margaretha Seeds, MD;  Location: [facility];  Service: Thoracic;  Laterality: N/A;   Family History:  Family History  Family history unknown: Yes   Family Psychiatric  History: Nonpertinent nonpertinentNon-pertinent family psychiatric history Social History:  Social History   Substance and Sexual Activity  Alcohol Use Not Currently   Comment: social      Social History   Substance and Sexual Activity  Drug Use No    Social History   Socioeconomic History  . Marital status: Married    Spouse name: Not on file  . Number of children: Not on file  . Years of education: Not on file  . Highest education level: Not on file  Occupational History  . Not on file  Tobacco Use  . Smoking status: Never Smoker  . Smokeless tobacco: Never Used  Vaping Use  . Vaping Use: Never used  Substance and Sexual Activity  . Alcohol use: Not Currently    Comment: social   . Drug use: No  . Sexual activity: Yes    Birth control/protection: Surgical  Other Topics Concern  . Not on file  Social History Narrative  . Not on file   Social Determinants of Health   Financial Resource Strain: Not on file  Food Insecurity: Not on file  Transportation Needs: Not on file  Physical Activity: Not on file  Stress: Not on file  Social Connections: Not on file   Additional Social History: Patient lives with her husband and a 64 year old son in [place]:   Allergies  Allergen Reactions  . Ferrous Bisglycinate Chelate [Iron] Anaphylaxis    Only IV   . Mushroom Extract Complex Anaphylaxis  . Na Ferric Gluc Cplx In Sucrose Anaphylaxis  . Cymbalta [Duloxetine Hcl] Swelling and Anxiety  . Succinylcholine Other (See Comments)    Lock Jaw  . Buprenorphine Hcl Hives  . Compazine Other (See Comments)    Altered mental status Aggression  . Metoclopramide Other (See Comments)    Dystonia  . Morphine And  Related Hives  . Ondansetron Hives and Rash  . Promethazine Hcl Hives  . Tegaderm Ag Mesh [Silver] Rash    Old formulation only, is able to tolerate new formulation    Labs:  Results for orders placed or performed during the hospital encounter of 12/08/20 (from the past 48 hour(s))  ACTH Level     Status: None   Collection Time: 12/08/20  3:32 PM  Result Value Ref Range   C206 ACTH 14.5 7.2 - 63.3 pg/mL    Comment: (NOTE) ACTH reference interval for samples collected between 7 and 10 AM. Performed At: [facility] [place], [place] [id number] Rush Farmer MD BO:[id number]   Comprehensive metabolic panel     Status: Abnormal   Collection Time: 12/08/20  3:35 PM  Result Value Ref Range   Sodium 141 135 - 145 mmol/L   Potassium 3.3 (L) 3.5 - 5.1 mmol/L   Chloride 103 98 - 111 mmol/L   CO2 21 (L) 22 - 32 mmol/L   Glucose, Bld 113 (H) 70 - 99 mg/dL    Comment: Glucose reference range applies only to samples taken after fasting for at least 8 hours.   BUN 19 6 - 20 mg/dL   Creatinine, Ser 1.24 (H) 0.44 - 1.00 mg/dL   Calcium 9.4 8.9 - 10.3 mg/dL   Total Protein 8.2 (H) 6.5 - 8.1 g/dL   Albumin 4.3 3.5 - 5.0 g/dL   AST 32 15 - 41 U/L   ALT 43 0 - 44 U/L   Alkaline Phosphatase 67 38 - 126 U/L   Total Bilirubin 0.6 0.3 - 1.2 mg/dL   GFR, Estimated 53 (L) >60 mL/min    Comment: (NOTE) Calculated using the CKD-EPI Creatinine Equation (2021)    Anion gap 17 (H) 5 - 15    Comment: Performed at [facility], [place] [address], [place], [place] [address]  CBC with Differential     Status: Abnormal   Collection Time: 12/08/20  3:35 PM  Result Value Ref Range   WBC 9.6 4.0 - 10.5 K/uL   RBC 4.43 3.87 - 5.11 MIL/uL   Hemoglobin 11.9 (L) 12.0 - 15.0 g/dL   HCT 38.2 36.0 - 46.0 %   MCV 86.2 80.0 - 100.0 fL  MCH 26.9 26.0 - 34.0 pg   MCHC 31.2 30.0 - 36.0 g/dL   RDW 15.2 11.5 - 15.5 %   Platelets 350 150 - 400 K/uL   nRBC 0.0 0.0 - 0.2 %    Neutrophils Relative % 60 %   Neutro Abs 5.8 1.7 - 7.7 K/uL   Lymphocytes Relative 30 %   Lymphs Abs 2.9 0.7 - 4.0 K/uL   Monocytes Relative 7 %   Monocytes Absolute 0.7 0.1 - 1.0 K/uL   Eosinophils Relative 2 %   Eosinophils Absolute 0.2 0.0 - 0.5 K/uL   Basophils Relative 1 %   Basophils Absolute 0.1 0.0 - 0.1 K/uL   Immature Granulocytes 0 %   Abs Immature Granulocytes 0.02 0.00 - 0.07 K/uL    Comment: Performed at [facility], [place] [address] [place], [place] [address]  Culture, blood (routine x 2)     Status: None (Preliminary result)   Collection Time: 12/08/20  3:35 PM   Specimen: Site Not Specified; Blood  Result Value Ref Range   Specimen Description      SITE NOT SPECIFIED Performed at [facility] [address], [place], [place] [address]    Special Requests      BOTTLES DRAWN AEROBIC AND ANAEROBIC Blood Culture adequate volume Performed at [facility] [address] [place], [place] [address]    Culture      NO GROWTH < 24 HOURS Performed at [facility] [address] [place], [place] [address]    Report Status PENDING   Magnesium     Status: None   Collection Time: 12/08/20  3:35 PM  Result Value Ref Range   Magnesium 1.8 1.7 - 2.4 mg/dL    Comment: Performed at [facility], [place] [address] [place], [place] [address]  Cortisol, Random     Status: None   Collection Time: 12/08/20  3:35 PM  Result Value Ref Range   Cortisol, Plasma 4.9 ug/dL    Comment: (NOTE) AM    6.7 - 22.6 ug/dL PM   <10.0       ug/dL Performed at [facility] [address] [place], [place] [address]   Lactic acid, plasma     Status: Abnormal   Collection Time: 12/08/20  3:35 PM  Result Value Ref Range   Lactic Acid, Venous 2.3 (HH) 0.5 - 1.9 mmol/L    Comment: CRITICAL RESULT CALLED TO, READ BACK BY AND VERIFIED WITH: D.HENRY AT 1657 ON 03.10.22 BY N.THOMPSON Performed at [facility], [place] [address], [place], [place] [address]   SARS CORONAVIRUS 2 (TAT 6-24 HRS) Nasopharyngeal Nasopharyngeal Swab     Status: None   Collection Time: 12/08/20  3:35 PM   Specimen: Nasopharyngeal Swab  Result Value Ref Range   SARS Coronavirus 2 NEGATIVE NEGATIVE    Comment: (NOTE) SARS-CoV-2 target nucleic acids are NOT DETECTED.  The SARS-CoV-2 RNA is generally detectable in upper and lower respiratory specimens during the acute phase of infection. Negative results do not preclude SARS-CoV-2 infection, do not rule out co-infections with other pathogens, and should not be used as the sole basis for treatment or other patient management decisions. Negative results must be combined with clinical observations, patient history, and epidemiological information. The expected result is Negative.  Fact Sheet for Patients: SugarRoll.be  Fact Sheet for Healthcare Providers: https://www.woods-mathews.com/  This test is not yet approved or cleared by the  [place] Architectural technologist and  has been authorized for detection and/or diagnosis of SARS-CoV-2 by FDA under an Print production planner (EUA). This EUA will remain  in effect (meaning this test can be used) for the duration of the COVID-19 declaration under Se ction 564(b)(1) of the Act, 21 U.S.C. section 360bbb-3(b)(1), unless the authorization is terminated or revoked sooner.  Performed at [facility], [place] [address] [place], [place] [address]   Urinalysis, Routine w reflex microscopic     Status: Abnormal   Collection Time: 12/08/20  6:47 PM  Result Value Ref Range   Color, Urine YELLOW YELLOW   APPearance HAZY (A) CLEAR   Specific Gravity, Urine 1.008 1.005 - 1.030   pH 7.0 5.0 - 8.0   Glucose, UA NEGATIVE NEGATIVE mg/dL   Hgb urine dipstick NEGATIVE NEGATIVE   Bilirubin Urine NEGATIVE NEGATIVE   Ketones, ur NEGATIVE NEGATIVE mg/dL   Protein, ur NEGATIVE NEGATIVE  mg/dL   Nitrite NEGATIVE NEGATIVE   Leukocytes,Ua NEGATIVE NEGATIVE    Comment: Performed at [facility] [address] [place], [place] [address]  Culture, blood (routine x 2)     Status: None (Preliminary result)   Collection Time: 12/08/20 10:39 PM   Specimen: BLOOD  Result Value Ref Range   Specimen Description BLOOD PIC LINE    Special Requests      BOTTLES DRAWN AEROBIC AND ANAEROBIC Blood Culture adequate volume   Culture      NO GROWTH < 12 HOURS Performed at [facility] [address] [place], [place] [address]    Report Status PENDING   Lactic acid, plasma     Status: Abnormal   Collection Time: 12/08/20 10:39 PM  Result Value Ref Range   Lactic Acid, Venous 2.6 (HH) 0.5 - 1.9 mmol/L    Comment: CRITICAL RESULT CALLED TO, READ BACK BY AND VERIFIED WITH: LEWIS L,RN 12/08/20 2344 WAYK Performed at [facility], [place] [address] [place], [place] [address]   Blood gas, venous (at [facility] and [facility], not at [facility])     Status: Abnormal   Collection Time: 12/08/20 10:42 PM  Result Value Ref Range   FIO2 21.00    pH, Ven 7.370 7.250 - 7.430   pCO2, Ven 43.3 (L) 44.0 - 60.0 mmHg   pO2, Ven 36.4 32.0 - 45.0 mmHg   Bicarbonate 24.5 20.0 - 28.0 mmol/L   Acid-base deficit 0.1 0.0 - 2.0 mmol/L   O2 Saturation 64.4 %   Patient temperature 37.0    Collection site VENOUS    Drawn by 1470    Sample type VENOUS     Comment: Performed at [facility], [place] [address], [place], [place] [address]  Comprehensive metabolic panel     Status: Abnormal   Collection Time: 12/09/20  5:30 AM  Result Value Ref Range   Sodium 139 135 - 145 mmol/L   Potassium 3.3 (L) 3.5 - 5.1 mmol/L   Chloride 103 98 - 111 mmol/L   CO2 28 22 - 32 mmol/L   Glucose, Bld 94 70 - 99 mg/dL    Comment: Glucose reference range applies only to samples taken after fasting for at least 8 hours.   BUN 15 6 - 20 mg/dL   Creatinine, Ser 1.12 (H) 0.44 - 1.00 mg/dL   Calcium 8.8 (L) 8.9 -  10.3 mg/dL   Total Protein 6.5 6.5 - 8.1 g/dL   Albumin 3.4 (L) 3.5 - 5.0  g/dL   AST 23 15 - 41 U/L   ALT 32 0 - 44 U/L   Alkaline Phosphatase 57 38 - 126 U/L   Total Bilirubin 0.7 0.3 - 1.2 mg/dL   GFR, Estimated >60 >60 mL/min    Comment: (NOTE) Calculated using the CKD-EPI Creatinine Equation (2021)    Anion gap 8 5 - 15    Comment: Performed at [facility] [address] [place], [place] [address]  CBC     Status: Abnormal   Collection Time: 12/09/20  5:30 AM  Result Value Ref Range   WBC 8.5 4.0 - 10.5 K/uL   RBC 3.75 (L) 3.87 - 5.11 MIL/uL   Hemoglobin 10.2 (L) 12.0 - 15.0 g/dL   HCT 32.8 (L) 36.0 - 46.0 %   MCV 87.5 80.0 - 100.0 fL   MCH 27.2 26.0 - 34.0 pg   MCHC 31.1 30.0 - 36.0 g/dL   RDW 15.3 11.5 - 15.5 %   Platelets 313 150 - 400 K/uL   nRBC 0.0 0.0 - 0.2 %    Comment: Performed at [facility], [place] [address], [place], [place] [address]    Current Facility-Administered Medications  Medication Dose Route Frequency Provider Last Rate Last Admin  . acetaminophen (TYLENOL) tablet 1,000 mg  1,000 mg Oral Once Kathrynn Humble, Ankit, MD      . albuterol (PROVENTIL) (2.5 MG/3ML) 0.083% nebulizer solution 2.5 mg  2.5 mg Nebulization Q6H PRN Marcelyn Bruins, MD      . ALPRAZolam Duanne Moron) tablet 0.5 mg  0.5 mg Oral BID PRN Marcelyn Bruins, MD      . alum & mag hydroxide-simeth (MAALOX/MYLANTA) 200-200-20 MG/5ML suspension 30 mL  30 mL Oral Daily PRN Marcelyn Bruins, MD      . aspirin tablet 325 mg  325 mg Oral QHS Marcelyn Bruins, MD      . budesonide (PULMICORT) nebulizer solution 0.5 mg  0.5 mg Inhalation BID Marcelyn Bruins, MD      . bumetanide Cleda Clarks) tablet 1 mg  1 mg Oral BID Marcelyn Bruins, MD   1 mg at 12/08/20 2140  . Chlorhexidine Gluconate Cloth 2 % PADS 6 each  6 each Topical Daily [place], Marge Duncans, MD      . colchicine tablet 0.6 mg  0.6 mg Oral Q1500 Marcelyn Bruins, MD      . Dexlansoprazole (Dexilant) capsule 60 mg -  patient's own supply  60 mg Oral Daily Oswald Hillock, MD   60 mg at 12/09/20 1234  . dextromethorphan-guaiFENesin (MUCINEX DM) 30-600 MG per 12 hr tablet 1 tablet  1 tablet Oral BID Lovey Newcomer T, NP   1 tablet at 12/09/20 0041  . diphenhydrAMINE (BENADRYL) capsule 50 mg  50 mg Oral Q8H PRN Oswald Hillock, MD      . dronabinol (MARINOL) capsule 2.5 mg  2.5 mg Oral BID PRN Marcelyn Bruins, MD   2.5 mg at 12/09/20 0535  . enoxaparin (LOVENOX) injection 40 mg  40 mg Subcutaneous Q24H Marcelyn Bruins, MD   40 mg at 12/08/20 2324  . escitalopram (LEXAPRO) tablet 20 mg  20 mg Oral QPM Marcelyn Bruins, MD   20 mg at 12/09/20 0019  . fluticasone furoate-vilanterol (BREO ELLIPTA) 200-25 MCG/INH 1 puff  1 puff Inhalation Daily Marcelyn Bruins, MD      . hydrocortisone (CORTEF) tablet 10 mg  10 mg Oral QHS Marcelyn Bruins, MD      . [  START ON 12/10/2020] hydrocortisone (CORTEF) tablet 20 mg  20 mg Oral Daily Marcelyn Bruins, MD      . hydrocortisone (CORTEF) tablet 50 mg  50 mg Oral Daily Marcelyn Bruins, MD      . ketorolac (TORADOL) 30 MG/ML injection 15 mg  15 mg Intravenous Once Varney Biles, MD      . levothyroxine (SYNTHROID) tablet 112 mcg  112 mcg Oral QAC breakfast Marcelyn Bruins, MD   112 mcg at 12/09/20 0523  . metoprolol succinate (TOPROL-XL) 24 hr tablet 37.5 mg  37.5 mg Oral Daily Marcelyn Bruins, MD      . potassium chloride SA (KLOR-CON) CR tablet 40 mEq  40 mEq Oral Once [place], MD      . Rimegepant Sulfate TBDP 75 mg  75 mg Oral Daily PRN Marcelyn Bruins, MD      . rosuvastatin (CRESTOR) tablet 10 mg  10 mg Oral Daily Marcelyn Bruins, MD      . sodium chloride flush (NS) 0.9 % injection 3 mL  3 mL Intravenous Q12H Marcelyn Bruins, MD   3 mL at 12/09/20 0018  . topiramate (TOPAMAX) tablet 150 mg  150 mg Oral QHS Marcelyn Bruins, MD   150 mg at 12/08/20 2323  . zolpidem (AMBIEN) tablet 5 mg  5 mg Oral QHS PRN Marcelyn Bruins,  MD   5 mg at 12/08/20 2324    Musculoskeletal: Strength & Muscle Tone: within normal limits Gait & Station: normal Patient leans: N/A   Psychiatric Specialty Exam: Physical Exam Constitutional:      Appearance: She is obese.  HENT:     Nose: Nose normal.  Cardiovascular:     Rate and Rhythm: Normal rate and regular rhythm.  Pulmonary:     Effort: Pulmonary effort is normal.  Musculoskeletal:     Cervical back: Normal range of motion.  Neurological:     Mental Status: She is alert and oriented to person, place, and time.     Review of Systems  Blood pressure 125/75, pulse 90, temperature 98.6 F (37 C), temperature source Oral, resp. rate (!) 21, height _0  (1.651 m), weight 88 kg, SpO2 100 %.Body mass index is 32.28 kg/m.  General Appearance: Casual  Eye Contact:  Good  Speech:  Normal Rate  Volume:  Decreased  Mood:  Dysphoric  Affect:  Depressed  Thought Process:  Linear and Descriptions of Associations: Intact  Orientation:  Full (Time, Place, and Person)  Thought Content:  Logical  Suicidal Thoughts:  No  Homicidal Thoughts:  No  Memory:  Immediate;   Good Recent;   Good Remote;   Good  Judgement:  Fair  Insight:  Fair  Psychomotor Activity:  Normal  Concentration:  Concentration: Good and Attention Span: Good  Recall:  Good  Fund of Knowledge:  Good  Language:  Good  Akathisia:  No  Handed:  Right  AIMS (if indicated):     Assets:  Communication Skills Desire for Improvement Financial Resources/Insurance Housing Resilience Social Support Transportation  ADL's:  Intact  Cognition:  WNL  Sleep:      Assessment: 50 year old with past psychiatric history of depression and generalized anxiety disorder admitted for seizure-like activity. #Major depressive disorder #Generalized anxiety disorder -On evaluation today patient meets the criteria for major depressive disorder as she feels depressed, does not have any appetite, unable to sleep at night, no  interest in daily activities and unable to take  any pleasure, decreased concentration, decreased attention decreased psychomotor activity although she denies any suicidal ideations at this point.   -Patient identifies multiple recent stressors in her life like her daughter estrangement from her life due to her multiple medical conditions, newborn baby estrangement from her life. -Patient has benefitted from a combination of Lexapro 20 mg, Xanax and Ambien in past and she would like to continue that.  Her outpatient provider has stopped providing her prescription and she is looking for outpatient psychiatrist and therapist.   Treatment Plan: --Continue Lexapro 20 mg help with depressed mood and anxiety --Continue Ambien 5 mg as a sleep aid --Continue Xanax 0.5 mg twice daily as needed for anxiety --Appreciate social worker assistance with outpatient psychiatrist appointment --Psychiatry signing off but will be available for any further questions.  Disposition:  --No evidence of imminent risk to self or others at present --Patient does not meet criteria for psychiatric inpatient admission.  Honor Junes, MD 12/09/2020 4:19 PM  PGY-1, Resident

## 2020-12-09 NOTE — Progress Notes (Signed)
LTM EEG discontinued - Patient disconnected self from leads, left hospital against medical advice. Tech was not present at [employer], tech retrieved machine and headbox from room.  Both appear to be in working condition. [facility] notified.

## 2020-12-09 NOTE — Plan of Care (Signed)

## 2020-12-09 NOTE — Consult Note (Signed)
Neurology Consultation Reason for Consult: Seizure-like episodes Referring Physician: Dr. Eleonore Chiquito  CC: Seizure-like episodes  History is obtained from: Patient, husband at bedside, chart review  HPI: [name] is a 50 y.o. 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  While in the ED she became unresponsive, was not responding.  Tele neurology was consulted for possible seizures and recommended transfer to [facility] for LTM EEG.  Patient states she had dental procedure done for abscess on Monday after which she received steroid as usual but this time did not receive steroid taper.  He started feeling weakness described as post anesthesia hangover.  However it continued to get worse and therefore she eventually decided to come to ED.  Since arrival she has had multiple episodes where she feels like her muscles stiffen up and she has trouble speaking.  After the episodes she feels tired, described as running a marathon.  She reports that she has panic attacks but these episodes feel different because during her panic attacks she is usually able to communicate with her husband and breathe through them.  While I was talking to patient about the history she became tearful.  She was initially reporting distress due to not receiving her reflux medication which is why she is unable to tolerate food/fluid.  She also reported that she feels she is "third spacing".  She expressed concern about her increased lactic acid.  On asking further, patient became tearful and discussed how she had a granddaughter but her daughter is not allowing her to see the daughter.  Patient's parents were not  supposed to see the great-granddaughter either but then eventually went to see her.  The other day she also had an episode of transient blurry vision followed by migraine while she was driving with her son in the car.  She expressed concern that her son looked scared due to this episode.  She also discussed that she used to be on Lexapro and Klonopin for many many years and was recently switched to Prozac by her PCP as well as Xanax which has not helped.  ROS: All other systems reviewed and negative except as noted in the HPI.   Past Medical History:  Diagnosis Date  . Addison's disease ([place])   . Adrenal insufficiency ([place])   . Anemia   . Anxiety   . Aortic stenosis   . Aortic stenosis   . Appendicitis 12/19/09  . Appendicitis   . Breast cancer ([place])    STATUS POST BILATERAL MASTECTOMY. STATUS POST RECONSTRUCTION. SHE HAD SILICONE BREAST IMPLANTS AND THE LEFT IMPLANT IS LEAKING SLIGHTLY  . Cellulitis of right middle finger 11/07/2018  . Cervical cancer ([place]) 12/23/2018  . Chest pain   . CHF with right heart failure ([place]) 04/17/2017  . Chronic respiratory failure with hypoxia ([place]) 12/23/2018  . Cough variant asthma 04/13/2019  . Depression   . GERD (gastroesophageal reflux disease)    takes Dexilant and carafate and gi coctail   . Headache    migraines on a daily and monthly regimen   . Heart murmur   . History of kidney stones   . Hodgkin lymphoma (HCC)    STATUS POST MANTLE RADIATION  . Hodgkin's lymphoma ([place])  1987  . Hypertension   . Hypoxia   . Necrotizing fasciitis ([place]) 12/23/2018  . Non-ischemic cardiomyopathy ([place])   . Osteoporosis   . Palpitations   . Pituitary adenoma ([place]) 12/23/2018  . Pneumonia   . PONV (postoperative nausea and vomiting)   . Pre-diabetes    per pt; no meds  . Pulmonary hypertension ([place]) 12/23/2018  . Raynaud phenomenon   . Right heart failure ([place]) 04/17/2017  . Seizures ([place])    last febrile seizure was approx 3 weeks ago per report on  12/01/2020  . Sleep apnea    upcoming sleep study per pt   . Supplemental oxygen dependent    3 liters  . SVT (supraventricular tachycardia) ([place])   . Tachycardia   . Thyroid cancer ([place])    STATUS POST SURGICAL REMOVAL-CURRENT ON THYROID REPLACEMENT    Family History  Family history unknown: Yes    Social History:  reports that she has never smoked. She has never used smokeless tobacco. She reports previous alcohol use. She reports that she does not use drugs.   Exam: Current vital signs: BP 115/72 (BP Location: Left Arm)   Pulse 81   Temp 98.4 F (36.9 C) (Oral)   Resp 15   Ht _0  (1.651 m)   Wt 88 kg   LMP  (LMP Unknown)   SpO2 100%   BMI 32.28 kg/m  Vital signs in last 24 hours: Temp:  [97.7 F (36.5 C)-98.5 F (36.9 C)] 98.4 F (36.9 C) (03/11 1113) Pulse Rate:  [72-94] 81 (03/11 1113) Resp:  [12-23] 15 (03/11 1113) BP: (115-162)/(69-101) 115/72 (03/11 1113) SpO2:  [94 %-100 %] 100 % (03/11 1113) Weight:  [86.2 kg-88 kg] 88 kg (03/11 0559)   Physical Exam  Constitutional: Appears well-developed and well-nourished.  Psych: Tearful, appears anxious Eyes: No scleral injection HENT: No OP obstrucion Head: Normocephalic.  Cardiovascular: Normal rate and regular rhythm.  Respiratory: Effort normal, non-labored breathing GI: Soft.  No distension. There is no tenderness.  Skin: Warm Neuro: AOx3, cranial nerves II 12 grossly intact, no evidence of aphasia, 5/5 in upper extremities, sensations intact.  I have reviewed labs in epic and the results pertinent to this consultation are: No leukocytosis, normal sodium, urine analysis bland, normal cortisol, lactic acid 2.3--> 2.6.  I have reviewed the images obtained: CT head without contrast 12/01/2020: No acute abnormality.  ASSESSMENT/PLAN: 50 year old female with new onset seizure-like episodes  Muscle spasms -Patient currently on EEG.  One episode overnight did not show any correlate and not therefore not  epileptic  Recommendations: -Patient's history, semiology of the episodes as well as EEG pending so far are concerning for nonepileptic spells -However, given patient's complicated medical history we will continue LTM for 24 hours for characterization of spells.  If normal, will discontinue tomorrow morning -Will not start patient on any AED yet unless EEG shows any evidence of epileptogenicity -We will notify Dr. Darrick Meigs about resuming patient's home medications -Patient requested psych consult to help with her medications including Lexapro and Xanax -Management of rest of comorbidities per primary team  I have spent a total of 130  minutes with the patient reviewing hospital notes,  test results, labs and examining the patient as well as establishing an assessment and plan that was discussed personally with the patient, husband at bedside.  > 50% of time was spent in direct patient care.  Zeb [facility]fort [facility] neuro[facility]ist

## 2020-12-09 NOTE — Progress Notes (Signed)
LTM EEG hooked up and running - no initial skin breakdown - push button tested - neuro notified. [facility] monitored

## 2020-12-11 MED FILL — NORMAL SALINE FLUSH SYRINGE: 0.9 | 14 days supply | Qty: 1200 | Fill #1

## 2020-12-12 NOTE — Discharge Summary (Signed)
Left AMA   50 year old female with medical history of adrenal insufficiency, chronic respiratory failure on home O2, GERD, history of cancer including cervical, breast, Hodgkin's with [place] lesion resection leading to adrenal insufficiency, hyperlipidemia, hypertension, asthma, aortic stenosis who presented with seizure-like episode.  She recently had dental procedure at that time she had stress dose steroid of Solu-Cortef 100 mg IV x1 due to adrenal insufficiency.  She did not have taper at discharge.  The night after she was discharged she felt a little fatigued with anesthesia.  Then she began to feel weaker.  Today she had episode of unresponsive at home and was brought to the hospital.  Neurology was consulted and there was concern for possible seizure versus pseudoseizure and she was transferred to [facility] for LTM EEG monitoring  Patient was seen by Neurology, LTM EEG was started. She was also seen by psychiatry,however, patient decided to leave AMA.

## 2020-12-13 LAB — CULTURE, BLOOD (ROUTINE X 2)
Culture: NO GROWTH
Special Requests: ADEQUATE

## 2020-12-14 LAB — CULTURE, BLOOD (ROUTINE X 2)
Culture: NO GROWTH
Special Requests: ADEQUATE

## 2020-12-15 ENCOUNTER — Other Ambulatory Visit: Payer: Self-pay | Admitting: *Deleted

## 2020-12-15 DIAGNOSIS — Z86000 Personal history of in-situ neoplasm of breast: Secondary | ICD-10-CM

## 2020-12-15 NOTE — Progress Notes (Signed)
Received call from pt requesting referral be sent for endocrinology with [facility] for evaluation and treatment of diabetes.  MD notified and verbal orders received to place referral.  Referral successfully faxed to [phone] with attention to [facility] (provided by pt)

## 2020-12-19 DIAGNOSIS — E890 Postprocedural hypothyroidism: Secondary | ICD-10-CM | POA: Diagnosis not present

## 2020-12-19 DIAGNOSIS — M8589 Other specified disorders of bone density and structure, multiple sites: Secondary | ICD-10-CM | POA: Diagnosis not present

## 2020-12-19 DIAGNOSIS — D443 Neoplasm of uncertain behavior of pituitary gland: Secondary | ICD-10-CM | POA: Diagnosis not present

## 2020-12-19 DIAGNOSIS — E2740 Unspecified adrenocortical insufficiency: Secondary | ICD-10-CM | POA: Diagnosis not present

## 2020-12-19 DIAGNOSIS — I10 Essential (primary) hypertension: Secondary | ICD-10-CM | POA: Diagnosis not present

## 2020-12-23 ENCOUNTER — Other Ambulatory Visit: Payer: BC Managed Care – PPO

## 2020-12-23 ENCOUNTER — Telehealth: Payer: Self-pay | Admitting: Hematology and Oncology

## 2020-12-23 ENCOUNTER — Other Ambulatory Visit: Payer: Self-pay | Admitting: Hematology and Oncology

## 2020-12-23 ENCOUNTER — Inpatient Hospital Stay: Payer: BC Managed Care – PPO

## 2020-12-23 NOTE — Telephone Encounter (Signed)
R/s todays appt per patients request. Called and spoke with patient. Confirmed appt

## 2020-12-26 ENCOUNTER — Telehealth: Payer: Self-pay | Admitting: Hematology and Oncology

## 2020-12-26 ENCOUNTER — Inpatient Hospital Stay: Payer: BC Managed Care – PPO

## 2020-12-26 DIAGNOSIS — R911 Solitary pulmonary nodule: Secondary | ICD-10-CM | POA: Diagnosis not present

## 2020-12-26 NOTE — Telephone Encounter (Signed)
Called pt to r/s appt per 3/28 sch msg. No answer. Left vm for pt to call back to r/s.

## 2020-12-27 LAB — ALDOSTERONE + RENIN ACTIVITY W/ RATIO
ALDO / PRA Ratio: 2.3 (ref 0.0–30.0)
Aldosterone: 2.3 ng/dL (ref 0.0–30.0)
PRA LC/MS/MS: 0.98 ng/mL/hr (ref 0.167–5.380)

## 2020-12-28 ENCOUNTER — Other Ambulatory Visit: Payer: Self-pay | Admitting: Hematology and Oncology

## 2020-12-29 ENCOUNTER — Other Ambulatory Visit: Payer: Self-pay | Admitting: Hematology and Oncology

## 2020-12-29 MED FILL — NORMAL SALINE FLUSH SYRINGE: 0.9 | 10 days supply | Qty: 300 | Fill #1

## 2020-12-30 MED FILL — HEPARIN LOCK FLUSH 100 UNIT: 100 | 5 days supply | Qty: 25 | Fill #0

## 2021-01-04 ENCOUNTER — Other Ambulatory Visit: Payer: Self-pay | Admitting: Hematology and Oncology

## 2021-01-04 DIAGNOSIS — E1169 Type 2 diabetes mellitus with other specified complication: Secondary | ICD-10-CM | POA: Diagnosis not present

## 2021-01-04 MED FILL — Heparin Sodium (Porcine) Lock Flush IV Soln 100 Unit/ML: INTRAVENOUS | 30 days supply | Qty: 150 | Fill #0 | Status: AC

## 2021-01-05 ENCOUNTER — Other Ambulatory Visit (HOSPITAL_COMMUNITY): Payer: Self-pay

## 2021-01-05 MED ORDER — SODIUM CHLORIDE FLUSH 0.9 % IV SOLN
INTRAVENOUS | 1 refills | Status: DC
  Filled 2021-01-05: qty 600, 30d supply, fill #0
  Filled 2021-01-15: qty 90, 7d supply, fill #1
  Filled 2021-01-16: qty 1110, 23d supply, fill #1
  Filled 2021-01-31: qty 600, 15d supply, fill #2

## 2021-01-06 ENCOUNTER — Other Ambulatory Visit (HOSPITAL_COMMUNITY): Payer: Self-pay

## 2021-01-10 ENCOUNTER — Encounter: Payer: Self-pay | Admitting: Cardiovascular Disease

## 2021-01-10 NOTE — Progress Notes (Signed)
  Pt could not get a ride. Her driver has covid   Will reschedule  This encounter was created in error - please disregard.

## 2021-01-11 ENCOUNTER — Encounter: Payer: BC Managed Care – PPO | Admitting: Cardiovascular Disease

## 2021-01-16 ENCOUNTER — Encounter: Payer: Self-pay | Admitting: Hematology and Oncology

## 2021-01-16 ENCOUNTER — Other Ambulatory Visit (HOSPITAL_COMMUNITY): Payer: Self-pay

## 2021-01-17 ENCOUNTER — Other Ambulatory Visit (HOSPITAL_COMMUNITY): Payer: Self-pay

## 2021-01-20 ENCOUNTER — Other Ambulatory Visit: Payer: Self-pay | Admitting: Hematology and Oncology

## 2021-01-20 DIAGNOSIS — R110 Nausea: Secondary | ICD-10-CM

## 2021-01-23 ENCOUNTER — Inpatient Hospital Stay: Payer: BC Managed Care – PPO | Attending: Hematology and Oncology | Admitting: Hematology and Oncology

## 2021-01-23 ENCOUNTER — Other Ambulatory Visit: Payer: Self-pay | Admitting: Hematology and Oncology

## 2021-01-23 ENCOUNTER — Other Ambulatory Visit: Payer: Self-pay | Admitting: *Deleted

## 2021-01-23 ENCOUNTER — Inpatient Hospital Stay: Payer: BC Managed Care – PPO

## 2021-01-23 ENCOUNTER — Ambulatory Visit (HOSPITAL_COMMUNITY)
Admission: RE | Admit: 2021-01-23 | Discharge: 2021-01-23 | Disposition: A | Discharge status: Home / Self Care | Payer: BC Managed Care – PPO | Source: Ambulatory Visit | Attending: Hematology and Oncology | Admitting: Hematology and Oncology

## 2021-01-23 ENCOUNTER — Other Ambulatory Visit: Payer: Self-pay

## 2021-01-23 VITALS — BP 148/84 | HR 109 | Temp 97.6°F | Resp 17 | Ht 65.0 in | Wt 193.3 lb

## 2021-01-23 DIAGNOSIS — Z86000 Personal history of in-situ neoplasm of breast: Secondary | ICD-10-CM

## 2021-01-23 DIAGNOSIS — Z7982 Long term (current) use of aspirin: Secondary | ICD-10-CM | POA: Insufficient documentation

## 2021-01-23 DIAGNOSIS — Z95828 Presence of other vascular implants and grafts: Secondary | ICD-10-CM

## 2021-01-23 DIAGNOSIS — F331 Major depressive disorder, recurrent, moderate: Secondary | ICD-10-CM

## 2021-01-23 DIAGNOSIS — I471 Supraventricular tachycardia: Secondary | ICD-10-CM | POA: Insufficient documentation

## 2021-01-23 DIAGNOSIS — N179 Acute kidney failure, unspecified: Secondary | ICD-10-CM

## 2021-01-23 DIAGNOSIS — Z85850 Personal history of malignant neoplasm of thyroid: Secondary | ICD-10-CM | POA: Insufficient documentation

## 2021-01-23 DIAGNOSIS — E876 Hypokalemia: Secondary | ICD-10-CM | POA: Diagnosis not present

## 2021-01-23 DIAGNOSIS — E039 Hypothyroidism, unspecified: Secondary | ICD-10-CM | POA: Diagnosis not present

## 2021-01-23 DIAGNOSIS — Z79899 Other long term (current) drug therapy: Secondary | ICD-10-CM | POA: Diagnosis not present

## 2021-01-23 DIAGNOSIS — E2740 Unspecified adrenocortical insufficiency: Secondary | ICD-10-CM

## 2021-01-23 DIAGNOSIS — R635 Abnormal weight gain: Secondary | ICD-10-CM

## 2021-01-23 DIAGNOSIS — Z8572 Personal history of non-Hodgkin lymphomas: Secondary | ICD-10-CM | POA: Insufficient documentation

## 2021-01-23 DIAGNOSIS — I809 Phlebitis and thrombophlebitis of unspecified site: Secondary | ICD-10-CM

## 2021-01-23 DIAGNOSIS — C50919 Malignant neoplasm of unspecified site of unspecified female breast: Secondary | ICD-10-CM | POA: Diagnosis not present

## 2021-01-23 LAB — CMP (CANCER CENTER ONLY)
ALT: 36 U/L (ref 0–44)
AST: 24 U/L (ref 15–41)
Albumin: 4.1 g/dL (ref 3.5–5.0)
Alkaline Phosphatase: 81 U/L (ref 38–126)
Anion gap: 13 (ref 5–15)
BUN: 28 mg/dL — ABNORMAL HIGH (ref 6–20)
CO2: 27 mmol/L (ref 22–32)
Calcium: 9.6 mg/dL (ref 8.9–10.3)
Chloride: 99 mmol/L (ref 98–111)
Creatinine: 1.34 mg/dL — ABNORMAL HIGH (ref 0.44–1.00)
GFR, Estimated: 49 mL/min — ABNORMAL LOW (ref >60)
Glucose, Bld: 152 mg/dL — ABNORMAL HIGH (ref 70–99)
Potassium: 3 mmol/L — ABNORMAL LOW (ref 3.5–5.1)
Sodium: 139 mmol/L (ref 135–145)
Total Bilirubin: 0.3 mg/dL (ref 0.3–1.2)
Total Protein: 8 g/dL (ref 6.5–8.1)

## 2021-01-23 LAB — CBC WITH DIFFERENTIAL (CANCER CENTER ONLY)
Abs Immature Granulocytes: 0.03 10*3/uL (ref 0.00–0.07)
Basophils Absolute: 0.1 10*3/uL (ref 0.0–0.1)
Basophils Relative: 1 %
Eosinophils Absolute: 0.2 10*3/uL (ref 0.0–0.5)
Eosinophils Relative: 2 %
HCT: 39 % (ref 36.0–46.0)
Hemoglobin: 12.1 g/dL (ref 12.0–15.0)
Immature Granulocytes: 0 %
Lymphocytes Relative: 40 %
Lymphs Abs: 3.8 10*3/uL (ref 0.7–4.0)
MCH: 26.1 pg (ref 26.0–34.0)
MCHC: 31 g/dL (ref 30.0–36.0)
MCV: 84.2 fL (ref 80.0–100.0)
Monocytes Absolute: 0.9 10*3/uL (ref 0.1–1.0)
Monocytes Relative: 9 %
Neutro Abs: 4.5 10*3/uL (ref 1.7–7.7)
Neutrophils Relative %: 48 %
Platelet Count: 419 10*3/uL — ABNORMAL HIGH (ref 150–400)
RBC: 4.63 MIL/uL (ref 3.87–5.11)
RDW: 15 % (ref 11.5–15.5)
WBC Count: 9.5 10*3/uL (ref 4.0–10.5)
nRBC: 0 % (ref 0.0–0.2)

## 2021-01-23 LAB — T4, FREE: Free T4: 1.14 ng/dL — ABNORMAL HIGH (ref 0.61–1.12)

## 2021-01-23 LAB — TSH: TSH: 0.19 u[IU]/mL — ABNORMAL LOW (ref 0.308–3.960)

## 2021-01-23 MED ORDER — SODIUM CHLORIDE 0.9% FLUSH
10.0000 mL | INTRAVENOUS | Status: DC | PRN
  Administered 2021-01-23: 10 mL
  Filled 2021-01-23: qty 10

## 2021-01-23 MED ORDER — HEPARIN SOD (PORK) LOCK FLUSH 100 UNIT/ML IV SOLN
500.0000 [IU] | Freq: Once | INTRAVENOUS | Status: AC | PRN
  Administered 2021-01-23: 500 [IU]
  Filled 2021-01-23: qty 5

## 2021-01-23 MED ORDER — ESCITALOPRAM OXALATE 20 MG PO TABS: 20.0000 mg | ORAL_TABLET | Freq: Every evening | ORAL | 3 refills | Status: DC

## 2021-01-23 NOTE — Progress Notes (Signed)
Per MD request, RN placed orders for STAT 2 view chest xray for evaluation of port a cath tip placement and fluid on the lungs.

## 2021-01-23 NOTE — Progress Notes (Signed)
Patient Care Team: Nicholas Lose, MD as PCP - General (Hematology and Oncology) Nahser, Wonda Cheng, MD as PCP - Cardiology (Cardiology) Maylon Peppers, MD (Family Medicine)  DIAGNOSIS:    ICD-10-CM   1. Weight gain  R63.5 CBC with Differential ([place])    CMP ([place] only)    Thyroid Panel With TSH  2. Adrenal insufficiency (HCC)  E27.40 CBC with Differential ([place])    CMP ([place] only)    Thyroid Panel With TSH  3. Hypokalemia  E87.6 CBC with Differential ([place])    CMP ([place] only)    Thyroid Panel With TSH  4. AKI (acute kidney injury) ([place])  N17.9 CBC with Differential ([place])    CMP ([place] only)    Thyroid Panel With TSH  5. Superficial thrombophlebitis, involving unspecified site  I80.9   6. Major depressive disorder, recurrent episode, moderate (HCC)  F33.1   7. History of ductal carcinoma in situ (DCIS) of breast  Z86.000     SUMMARY OF ONCOLOGIC HISTORY: Oncology History  Hx of Hodgkins lymphoma   Initial Diagnosis   Hx of Hodgkins lymphoma    Radiation Therapy     History of thyroid cancer   Initial Diagnosis   Thyroid cancer (HCC)    Remission      Remission      Radiation Therapy     History of cervical cancer  12/23/2018 Initial Diagnosis   History of cervical cancer    Remission      Radiation Therapy       CHIEF COMPLIANT: Follow-up of SVT, iron deficiency anemia on surveillance  INTERVAL HISTORY: [name] is a 50 y.o. 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 She recently underwent surgery for a dental abscess and was seen in the ED 3 days later after her husband found her weak and unresponsive.She presents to the clinic todayfor follow-up.  She reports that Bumex is helping her swelling but it is also causing her low potassium.  She is currently taking oral  potassium replacement therapy.  She does admit that Dr. Michiel Sites has assumed her care of her adrenal insufficiency and hypothyroidism.  ALLERGIES:  is allergic to ferrous bisglycinate chelate [iron], mushroom extract complex, na ferric gluc cplx in sucrose, cymbalta [duloxetine hcl], succinylcholine, buprenorphine hcl, compazine, metoclopramide, morphine and related, ondansetron, promethazine hcl, and tegaderm ag mesh [silver].  MEDICATIONS:  Current Outpatient Medications  Medication Sig Dispense Refill  . albuterol (2.5 MG/3ML) 0.083% NEBU 3 mL, albuterol (5 MG/ML) 0.5% NEBU 0.5 mL Inhale 3 mg into the lungs every 6 (six) hours as needed (SOB).     Marland Kitchen albuterol (VENTOLIN HFA) 108 (90 Base) MCG/ACT inhaler Inhale 2 puffs into the lungs every 6 (six) hours as needed for wheezing or shortness of breath.    . ALPRAZolam (XANAX) 0.5 MG tablet Take 0.5 mg by mouth 2 (two) times daily as needed for anxiety.    Marland Kitchen alum & mag hydroxide-simeth (MAALOX/MYLANTA) 200-200-20 MG/5ML suspension Take 30 mLs by mouth daily as needed for indigestion or heartburn. For Bleeding Ulcers    . aspirin 325 MG tablet Take 325 mg by mouth at bedtime.     . beclomethasone (QVAR) 80 MCG/ACT inhaler Inhale 1 puff into the lungs 2 (two) times daily.     . benzonatate (TESSALON) 200 MG capsule Take 1 capsule (200 mg total) by  mouth 3 (three) times daily as needed for cough. (Patient not taking: No sig reported) 45 capsule 1  . bumetanide (BUMEX) 1 MG tablet TAKE 1 TABLET BY MOUTH TWICE A DAY 180 tablet 1  . colchicine 0.6 MG tablet Take 0.6 mg by mouth daily in the afternoon.     Marland Kitchen dexlansoprazole (DEXILANT) 60 MG capsule Take 60 mg by mouth daily.    . diphenhydrAMINE (BENADRYL) 50 MG/ML injection Inject 1 mL (50 mg total) into the vein every 6 (six) hours as needed (nausea). (Patient not taking: No sig reported) 25 mL 0  . dronabinol (MARINOL) 2.5 MG capsule TAKE 1 CAPSULE TWICE DAILYAS NEEDED 30 capsule 0  . EPINEPHRINE 0.3  mg/0.3 mL IJ SOAJ injection INJECT 0.3 MLS (0.3 MG TOTAL) INTO THE MUSCLE AS NEEDED FOR ANAPHYLAXIS. (Patient taking differently: Inject 0.3 mg into the muscle daily as needed for anaphylaxis.) 2 each 3  . escitalopram (LEXAPRO) 20 MG tablet Take 1 tablet (20 mg total) by mouth every evening. 90 tablet 3  . fluticasone furoate-vilanterol (BREO ELLIPTA) 200-25 MCG/INH AEPB Inhale 1 puff into the lungs daily.    . Galcanezumab-gnlm 120 MG/ML SOAJ Inject 120 mg into the skin every 28 (twenty-eight) days.     . heparin lock flush 100 UNIT/ML SOLN injection USE 5 MLS IN PORT A CATH ONCE DAILY AFTER MEDICATION ADMINISTRATION AS A HEPLOCK AS DIRECTED. 150 mL 1  . hydrocortisone (CORTEF) 10 MG tablet Take 1-2 tablets (10-20 mg total) by mouth See admin instructions. Take 20 mg in the am and 50m in the evening (Patient taking differently: Take 10-20 mg by mouth See admin instructions. Take 20 mg by mouth in the morning and 114min the evening)    . levothyroxine (SYNTHROID, LEVOTHROID) 112 MCG tablet Take 112 mcg by mouth daily before breakfast.    . LORazepam (ATIVAN) 0.5 MG tablet TAKE 1 TO 2 TABLETS BY MOUTH TWICE A DAY AS NEEDED FOR NAUSEA 30 tablet 1  . Mepolizumab (NUCALA) 100 MG SOLR Inject 100 mg into the skin every 28 (twenty-eight) days. (Patient not taking: No sig reported) 1 each 3  . metoprolol succinate (TOPROL-XL) 25 MG 24 hr tablet Take 37.5 mg by mouth daily.     . OXYGEN Inhale 3-4 L into the lungs continuous.    . potassium chloride (KLOR-CON) 10 MEQ tablet Take 1 tablet (10 mEq total) by mouth daily for 21 days. 21 tablet 0  . potassium chloride (KLOR-CON) 10 MEQ tablet Take 10 mEq by mouth daily as needed (low potassium).    . Rimegepant Sulfate (NURTEC) 75 MG TBDP Take 75 mg by mouth daily as needed (Migraine).     . rosuvastatin (CRESTOR) 10 MG tablet Take 10 mg by mouth daily.     . silver sulfADIAZINE (SILVADENE) 1 % cream Apply 1 application topically daily as needed (Open Wound).      . sodium chloride 0.9 % infusion Use as directed. 300 mL PRN  . sodium chloride flush (BD POSIFLUSH) 0.9 % SOLN injection FLUSH PORT A CATH WITH 10 ML BEFORE AND AFTER FLUIDS TO MAINTAIN PATENCY 1200 mL 1  . Sodium Chloride Flush (NORMAL SALINE FLUSH) 0.9 % SOLN USE TWO 10 ML FLUSHES BEFORE AND AFTER FLUIDS AND MEDICATION ADMINISTRATION, TO MAINTAIN PATENCY--USE UP TO 4 TIMES DAILY. 2400 mL 1  . sodium chloride flush 0.9 % SOLN injection FLUSH PORT A CATH WITH 10 ML BEFORE AND AFTER FLUIDS TO MAINTAIN PATENCY 10 mL 1  .  sodium chloride flush 0.9 % SOLN injection FLUSH PORT A CATH WITH 10 ML SODIUM CHLORIDE BEFORE AND AFTER FLUIDS TO MAINTAIN PATENCY 1200 mL 1  . sodium chloride flush 0.9 % SOLN injection USE AS DIRECTED TO FLUSH DAILY AFTER MEDICATION INFUSION 300 mL 1  . sucralfate (CARAFATE) 1 g tablet Take 1 g by mouth daily as needed (FOR ULCERS).     Marland Kitchen topiramate (TOPAMAX) 50 MG tablet Take 150 mg by mouth at bedtime.     Marland Kitchen zolpidem (AMBIEN CR) 12.5 MG CR tablet Take 12.5 mg by mouth at bedtime.     Current Facility-Administered Medications  Medication Dose Route Frequency Provider Last Rate Last Admin  . Mepolizumab SOLR 100 mg  100 mg Subcutaneous Q28 days Margaretha Seeds, MD   100 mg at 08/31/19 1017    PHYSICAL EXAMINATION: ECOG PERFORMANCE STATUS: 1 - Symptomatic but completely ambulatory  Vitals:   01/23/21 1128  BP: (!) 148/84  Pulse: (!) 109  Resp: 17  Temp: 97.6 F (36.4 C)  SpO2: 99%   Filed Weights   01/23/21 1128  Weight: 193 lb 4.8 oz (87.7 kg)    LABORATORY DATA:  I have reviewed the data as listed CMP Latest Ref Rng & Units 01/23/2021 12/09/2020 12/08/2020  Glucose 70 - 99 mg/dL 152(H) 94 113(H)  BUN 6 - 20 mg/dL 28(H) 15 19  Creatinine 0.44 - 1.00 mg/dL 1.34(H) 1.12(H) 1.24(H)  Sodium 135 - 145 mmol/L 139 139 141  Potassium 3.5 - 5.1 mmol/L 3.0(L) 3.3(L) 3.3(L)  Chloride 98 - 111 mmol/L 99 103 103  CO2 22 - 32 mmol/L 27 28 21(L)  Calcium 8.9 - 10.3  mg/dL 9.6 8.8(L) 9.4  Total Protein 6.5 - 8.1 g/dL 8.0 6.5 8.2(H)  Total Bilirubin 0.3 - 1.2 mg/dL 0.3 0.7 0.6  Alkaline Phos 38 - 126 U/L 81 57 67  AST 15 - 41 U/L 24 23 32  ALT 0 - 44 U/L 36 32 43    Lab Results  Component Value Date   WBC 9.5 01/23/2021   HGB 12.1 01/23/2021   HCT 39.0 01/23/2021   MCV 84.2 01/23/2021   PLT 419 (H) 01/23/2021   NEUTROABS 4.5 01/23/2021    ASSESSMENT & PLAN:  History of DCIS, thyroid cancer, cervical cancer, Hodgkin's lymphoma: In remission Lymphedema: Currently on Bumex. Renal insufficiency due to Bumex: Instructed to slightly reduce the Bumex dosage. History of anemia: Hemoglobin appears to be stable at 12.1.  Poor tissue: Chest x-ray was obtained today and port appears to be in good place.  There is no evidence of any pleural effusion. Return to clinic for labs and follow-up   Orders Placed This Encounter  Procedures  . CBC with Differential (Cancer Center Only)    Standing Status:   Future    Standing Expiration Date:   01/23/2022  . CMP ([place] only)    Standing Status:   Future    Standing Expiration Date:   01/23/2022  . Thyroid Panel With TSH    Standing Status:   Future    Standing Expiration Date:   01/23/2022   The patient has a good understanding of the overall plan. she agrees with it. she will call with any problems that may develop before the next visit here.  Total time spent: 30 mins including face to face time and time spent for planning, charting and coordination of care  Rulon Eisenmenger, MD, MPH 01/23/2021  I, Molly Dorshimer, am acting as Education administrator  for Dr. Nicholas Lose.  I have reviewed the above documentation for accuracy and completeness, and I agree with the above.

## 2021-01-23 NOTE — Patient Instructions (Signed)
Implanted Port Insertion, Care After This sheet gives you information about how to care for yourself after your procedure. Your health care provider may also give you more specific instructions. If you have problems or questions, contact your health care provider. What can I expect after the procedure? After the procedure, it is common to have:  Discomfort at the port insertion site.  Bruising on the skin over the port. This should improve over 3-4 days. Follow these instructions at home: [facility] care  After your port is placed, you will get a manufacturer's information card. The card has information about your port. Keep this card with you at all times.  Take care of the port as told by your health care provider. Ask your health care provider if you or a family member can get training for taking care of the port at home. A home health care nurse may also take care of the port.  Make sure to remember what type of port you have. Incision care  Follow instructions from your health care provider about how to take care of your port insertion site. Make sure you: ? Wash your hands with soap and water before and after you change your bandage (dressing). If soap and water are not available, use hand sanitizer. ? Change your dressing as told by your health care provider. ? Leave stitches (sutures), skin glue, or adhesive strips in place. These skin closures may need to stay in place for 2 weeks or longer. If adhesive strip edges start to loosen and curl up, you may trim the loose edges. Do not remove adhesive strips completely unless your health care provider tells you to do that.  Check your port insertion site every day for signs of infection. Check for: ? Redness, swelling, or pain. ? Fluid or blood. ? Warmth. ? Pus or a bad smell.      Activity  Return to your normal activities as told by your health care provider. Ask your health care provider what activities are safe for you.  Do not  lift anything that is heavier than 10 lb (4.5 kg), or the limit that you are told, until your health care provider says that it is safe. General instructions  Take over-the-counter and prescription medicines only as told by your health care provider.  Do not take baths, swim, or use a hot tub until your health care provider approves. Ask your health care provider if you may take showers. You may only be allowed to take sponge baths.  Do not drive for 24 hours if you were given a sedative during your procedure.  Wear a medical alert bracelet in case of an emergency. This will tell any health care providers that you have a port.  Keep all follow-up visits as told by your health care provider. This is important. Contact a health care provider if:  You cannot flush your port with saline as directed, or you cannot draw blood from the port.  You have a fever or chills.  You have redness, swelling, or pain around your port insertion site.  You have fluid or blood coming from your port insertion site.  Your port insertion site feels warm to the touch.  You have pus or a bad smell coming from the port insertion site. Get help right away if:  You have chest pain or shortness of breath.  You have bleeding from your port that you cannot control. Summary  Take care of the port as told by your  health care provider. Keep the manufacturer's information card with you at all times.  Change your dressing as told by your health care provider.  Contact a health care provider if you have a fever or chills or if you have redness, swelling, or pain around your port insertion site.  Keep all follow-up visits as told by your health care provider. This information is not intended to replace advice given to you by your health care provider. Make sure you discuss any questions you have with your health care provider. Document Revised: 04/15/2018 Document Reviewed: 04/15/2018 Elsevier Patient Education   [place].

## 2021-01-24 DIAGNOSIS — R569 Unspecified convulsions: Secondary | ICD-10-CM | POA: Diagnosis not present

## 2021-01-24 DIAGNOSIS — R739 Hyperglycemia, unspecified: Secondary | ICD-10-CM | POA: Diagnosis not present

## 2021-01-24 DIAGNOSIS — E271 Primary adrenocortical insufficiency: Secondary | ICD-10-CM | POA: Diagnosis not present

## 2021-01-25 ENCOUNTER — Telehealth: Payer: Self-pay | Admitting: Hematology and Oncology

## 2021-01-25 NOTE — Telephone Encounter (Signed)
Scheduled per 4/25 los. Called pt and left a msg

## 2021-01-26 DIAGNOSIS — R911 Solitary pulmonary nodule: Secondary | ICD-10-CM | POA: Diagnosis not present

## 2021-01-31 ENCOUNTER — Other Ambulatory Visit (HOSPITAL_COMMUNITY): Payer: Self-pay

## 2021-01-31 MED FILL — Heparin Sodium (Porcine) Lock Flush IV Soln 100 Unit/ML: INTRAVENOUS | 25 days supply | Qty: 125 | Fill #1 | Status: AC

## 2021-02-02 ENCOUNTER — Encounter: Payer: Self-pay | Admitting: Cardiovascular Disease

## 2021-02-02 NOTE — Progress Notes (Signed)
    This encounter was created in error - please disregard.

## 2021-02-03 ENCOUNTER — Encounter: Payer: BC Managed Care – PPO | Admitting: Cardiovascular Disease

## 2021-02-10 ENCOUNTER — Other Ambulatory Visit (HOSPITAL_COMMUNITY): Payer: Self-pay

## 2021-02-10 ENCOUNTER — Other Ambulatory Visit: Payer: Self-pay | Admitting: Hematology and Oncology

## 2021-02-10 MED ORDER — NORMAL SALINE FLUSH 0.9 % IV SOLN
INTRAVENOUS | 1 refills | Status: DC
  Filled 2021-02-10: qty 890, 22d supply, fill #0
  Filled 2021-02-11: qty 310, 8d supply, fill #0
  Filled 2021-02-14: qty 1200, 15d supply, fill #1
  Filled 2021-03-16: qty 2400, 30d supply, fill #2

## 2021-02-11 ENCOUNTER — Other Ambulatory Visit (HOSPITAL_COMMUNITY): Payer: Self-pay

## 2021-02-13 ENCOUNTER — Other Ambulatory Visit (HOSPITAL_COMMUNITY): Payer: Self-pay

## 2021-02-14 ENCOUNTER — Other Ambulatory Visit (HOSPITAL_COMMUNITY): Payer: Self-pay

## 2021-02-15 ENCOUNTER — Other Ambulatory Visit (HOSPITAL_COMMUNITY): Payer: Self-pay

## 2021-02-16 ENCOUNTER — Other Ambulatory Visit (HOSPITAL_COMMUNITY): Payer: Self-pay

## 2021-02-17 ENCOUNTER — Other Ambulatory Visit (HOSPITAL_COMMUNITY): Payer: Self-pay

## 2021-02-25 DIAGNOSIS — R911 Solitary pulmonary nodule: Secondary | ICD-10-CM | POA: Diagnosis not present

## 2021-02-26 ENCOUNTER — Other Ambulatory Visit: Payer: Self-pay | Admitting: Hematology and Oncology

## 2021-02-26 DIAGNOSIS — R110 Nausea: Secondary | ICD-10-CM

## 2021-02-27 ENCOUNTER — Other Ambulatory Visit: Payer: Self-pay | Admitting: Hematology and Oncology

## 2021-02-28 ENCOUNTER — Other Ambulatory Visit (HOSPITAL_COMMUNITY): Payer: Self-pay

## 2021-02-28 ENCOUNTER — Encounter: Payer: Self-pay | Admitting: Hematology and Oncology

## 2021-02-28 ENCOUNTER — Telehealth: Payer: Self-pay | Admitting: Hematology and Oncology

## 2021-02-28 MED ORDER — HEPARIN SOD (PORK) LOCK FLUSH 100 UNIT/ML IV SOLN
INTRAVENOUS | 1 refills | Status: DC
  Filled 2021-02-28: qty 150, 30d supply, fill #0
  Filled 2021-03-29: qty 150, 30d supply, fill #1

## 2021-02-28 NOTE — Telephone Encounter (Signed)
R/s per prov pal, pt aware

## 2021-03-01 ENCOUNTER — Encounter: Payer: Self-pay | Admitting: Cardiovascular Disease

## 2021-03-01 ENCOUNTER — Encounter: Payer: Self-pay | Admitting: Hematology and Oncology

## 2021-03-09 ENCOUNTER — Other Ambulatory Visit (HOSPITAL_COMMUNITY): Payer: Self-pay

## 2021-03-14 DIAGNOSIS — I10 Essential (primary) hypertension: Secondary | ICD-10-CM | POA: Diagnosis not present

## 2021-03-14 DIAGNOSIS — E2740 Unspecified adrenocortical insufficiency: Secondary | ICD-10-CM | POA: Diagnosis not present

## 2021-03-14 DIAGNOSIS — E890 Postprocedural hypothyroidism: Secondary | ICD-10-CM | POA: Diagnosis not present

## 2021-03-14 DIAGNOSIS — D443 Neoplasm of uncertain behavior of pituitary gland: Secondary | ICD-10-CM | POA: Diagnosis not present

## 2021-03-16 ENCOUNTER — Other Ambulatory Visit (HOSPITAL_COMMUNITY): Payer: Self-pay

## 2021-03-16 DIAGNOSIS — E890 Postprocedural hypothyroidism: Secondary | ICD-10-CM | POA: Diagnosis not present

## 2021-03-17 ENCOUNTER — Other Ambulatory Visit (HOSPITAL_COMMUNITY): Payer: Self-pay

## 2021-03-27 ENCOUNTER — Other Ambulatory Visit: Payer: Self-pay | Admitting: Hematology and Oncology

## 2021-03-27 ENCOUNTER — Other Ambulatory Visit: Payer: Self-pay

## 2021-03-27 ENCOUNTER — Ambulatory Visit (INDEPENDENT_AMBULATORY_CARE_PROVIDER_SITE_OTHER): Payer: BC Managed Care – PPO | Admitting: Acute Care

## 2021-03-27 ENCOUNTER — Encounter: Payer: Self-pay | Admitting: Acute Care

## 2021-03-27 ENCOUNTER — Telehealth: Payer: Self-pay | Admitting: Acute Care

## 2021-03-27 ENCOUNTER — Telehealth: Payer: Self-pay | Admitting: Pulmonary Disease

## 2021-03-27 DIAGNOSIS — J440 Chronic obstructive pulmonary disease with acute lower respiratory infection: Secondary | ICD-10-CM | POA: Diagnosis not present

## 2021-03-27 DIAGNOSIS — J209 Acute bronchitis, unspecified: Secondary | ICD-10-CM

## 2021-03-27 MED ORDER — DOXYCYCLINE HYCLATE 100 MG PO TABS: 100.0000 mg | ORAL_TABLET | Freq: Two times a day (BID) | ORAL | 0 refills | Status: DC

## 2021-03-27 MED ORDER — PREDNISONE 10 MG PO TABS: ORAL_TABLET | ORAL | 0 refills | Status: DC

## 2021-03-27 MED ORDER — ALBUTEROL SULFATE (2.5 MG/3ML) 0.083% IN NEBU: 2.5000 mg | INHALATION_SOLUTION | Freq: Four times a day (QID) | RESPIRATORY_TRACT | 12 refills | Status: DC | PRN

## 2021-03-27 NOTE — Telephone Encounter (Signed)
Patient has an telvisit with Eric Form at 430pm. I called patient to make them aware and verbalized understanding. Nothing further needed at this time

## 2021-03-27 NOTE — Telephone Encounter (Signed)
Please call this patient tomorrow and check on her. She is very sick. She knows to go to the Ed if she gets any worse. I want to make sure she is turning around in the next 24 hours. Thanks so much.

## 2021-03-27 NOTE — Patient Instructions (Addendum)
It is good to talk with you today. Please re-test for Covid .  If you are positive, please call the office in the morning and let us know so we can set you up for monoclonal antibody.  I have sent in a prescription for Doxycycline Take 100 mg twice daily x 7 days. I have sent in a prescription for Albuterol nebs. Use twice daily as needed for shortness of breath or wheezing.  Use instead of Pro air.  Continue using Breo and QVar as you have been doing Rinse mouth after use  Prednisone taper; 10 mg tablets: 4 tabs x 2 days, 3 tabs x 2 days, 2 tabs x 2 days 1 tab x 2 days then stop.  Start as needed for worsening wheezing. Adjust your Cortef as needed if you feel you need to start the prednisone.  Get OTC Mucinex ( Liquid) to thin your secretions.  If you get worse not better, or develop a fever, go to the Emergency room.  Wear oxygen to maintain sats of > 88-90% We will order a flutter valve through [employer] to use 4-6 times daily . Tessalon Perles as needed for cough.  We will have Triage call you tomorrow to see how you are doing.  Plenty of fluids and rest.  Please contact office for sooner follow up if symptoms do not improve or worsen or seek emergency care   Schedule with Dr. Loanne Drilling at earliest appointment.

## 2021-03-27 NOTE — Telephone Encounter (Signed)
Please schedule with NP if available.  Judson Roch, this is one of my clinic patients that I have not seen since 08/2019. She is having acute symptoms possible asthma exacerbation/sinus infection but will need more detailed history before determining this. After you see her, I am happy to do close follow-up next month to discuss her more chronic issues (30 minute slot needed).

## 2021-03-27 NOTE — Addendum Note (Signed)
Addended by: Carlisle Cater on: 03/27/2021 05:36 PM   Modules accepted: Orders

## 2021-03-27 NOTE — Telephone Encounter (Signed)
Primary Pulmonologist: Dr. Loanne Drilling Last office visit and with whom: Dr. Loanne Drilling on 08/31/19 What do we see them for (pulmonary problems): Moderate persistent Asthma without complication Last OV assessment/plan: see below  Was appointment offered to patient (explain)?    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  Status post multiple chemo radiation treatments with all cancers currently in remission.  She was previously followed at [facility] for management of cough variant asthma, subcentimeter lung nodules (stable) and chronic hypoxemia of unknown etiology.  She reports history of pulmonary fibrosis however on review of imaging no evidence of parenchymal disease on prior reports or recent chest imaging.   She has had prior work-up for hemoptysis including bronchoscopy and negative CTA. She recently did have an episode of hemoptysis while on anticoagulation for superficial thrombophlebitis but AC has been discontinued.   Today, she is stable. Her asthma is not controlled as she requires multiple nebulizer treatments daily but she is not in active exacerbation. She expressed concern about possible herpes zoster. She has not been vaccinated but did have chickenpox as a child. We discussed delaying Nucala until she can be vaccinated however she declined. Will monitor for side effects post-injection.   Moderate persistent asthma Continue Qvar 80 mcg one puff twice a day Continue Albuterol as needed Scheduled for Nucala today   Chronic hypoxemic respiratory failure Your goal oxygen level is greater than 88%. Try to give yourself a break on lower oxygen levels as long as it is above 88%.   Dysphagia GI referral in place   Bilateral upper extremities -Unclear etiology. May be related to prior radiation? Has recently been  evaluated by Rheumatology. No clear autoimmune process   No orders of the defined types were placed in this encounter.   No orders of the defined types were placed in this encounter.   Return in about 3 months (around 11/29/2019).   Chi Rodman Pickle, MD [facility] 08/31/2019 8:20 AM  Office Number [phone]       Reason for call: patient is calling and reporting symptoms that started on Saturday, 03/25/21. Son has a sinus infection that is taking ABX which started last tueday. Both were negative covid, patient last tested last Tuesday, 03/21/21. Patient has a deep cough on phone, reports wheezing, chest tightness and more SOB and greenish mucous production with a spot of dark blood which patient states is normal for her. She is using Qvar, Breo, mucinex, rescue inhaler and Budesonide nebs. Patient did neb treatment 4 times yesterday and 3 times today so far with no relief. Patient stated albuterol nebs usually help more. Patient is using 3L of O2 cont. Patient has not been seen in office since 08/31/19. Patient is requesting recs. Will route to Dr. Loanne Drilling for recs.  Dr. Loanne Drilling, please advise. Thanks!  (examples of things to ask: : When did symptoms start? Fever? Cough? Productive? Color to sputum? More sputum than usual? Wheezing? Have you needed increased oxygen? Are you taking your respiratory medications? What over the counter measures have you tried?)  Allergies  Allergen Reactions   Ferrous Bisglycinate Chelate [Iron] Anaphylaxis    Only IV    Mushroom Extract Complex Anaphylaxis   Na Ferric Gluc Cplx In Sucrose Anaphylaxis   Cymbalta [Duloxetine Hcl] Swelling and Anxiety   Succinylcholine Other (See Comments)    Lock Jaw   Buprenorphine  Hcl Hives   Compazine Other (See Comments)    Altered mental status Aggression   Metoclopramide Other (See Comments)    Dystonia   Morphine And Related Hives   Ondansetron Hives and Rash   Promethazine Hcl Hives    Tegaderm Ag Mesh [Silver] Rash    Old formulation only, is able to tolerate new formulation    Immunization History  Administered Date(s) Administered   Influenza, High Dose Seasonal PF 06/19/2019   Influenza, Quadrivalent, Recombinant, Inj, Pf 08/13/2018   Influenza, Seasonal, Injecte, Preservative Fre 06/23/2016, 11/12/2017, 08/13/2018   Influenza,inj,Quad PF,6+ Mos 07/04/2015, 07/04/2015, 06/23/2016, 07/03/2017   Influenza,inj,Quad PF,6-35 Mos 07/04/2015   Influenza-Unspecified 07/23/2007, 07/06/2008, 06/13/2009, 07/06/2010, 07/25/2011, 08/01/2012, 07/14/2013, 07/04/2015, 07/04/2015, 06/23/2016, 06/23/2016, 07/03/2017, 11/12/2017, 06/19/2019, 06/30/2020   Pneumococcal Conjugate-13 03/20/2019   Pneumococcal Polysaccharide-23 02/21/2017   Pneumococcal-Unspecified 02/21/2017   Td 12/19/2010   Varicella 04/02/2007

## 2021-03-27 NOTE — Progress Notes (Addendum)
Patient ID: [name], female   DOB: 22-Jan-1971, 50 y.o.   MRN: [id number] Virtual Visit via Video Note  I connected with [name] on 03/27/21 at  4:30 PM EDT by a video enabled telemedicine application and verified that I am speaking with the correct person using two identifiers.  Location: Patient: At home Provider: [place], [place], [place], [address]    I discussed the limitations of evaluation and management by telemedicine and the availability of in person appointments. The patient expressed understanding and agreed to proceed.  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  Status post multiple chemo radiation treatments with all cancers currently in remission.  She was previously followed at [facility] for management of cough variant asthma, subcentimeter lung nodules (stable) and chronic hypoxemia of unknown etiology.  She reports history of pulmonary fibrosis however on review of imaging no evidence of parenchymal disease on prior reports or recent chest imaging.She has poorly controlled asthma. She requires multiple nebulizer treatments daily. Shehas had a bronch in the past. She has had several cancer battles and has significant radiation therapy with scarring.   Pt. Has not been in to see anyone in this office since 08/2019. She was in the hospital most of  May and July 2021 with blood infection . 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   History of Present Illness: Pt presents for an acute visit for acute symptoms possible asthma exacerbation/sinus infection. She states she has been very sick, and that it hit her quickly. She went to the beach last Tuesday 03/21/2021. Her son was sick with head congestion and ear infection. She started having symptoms Friday 03/24/2021 afternoon /  evening, but Saturday 03/25/2021  was much worse. She is using Breo and Q var. She is using [employer], and she is using her budesonide nebs. She has cough and chest congestion. She is coughing up green blood tinged secretions. She states symptoms are in the chest , not her sinuses. She states blood tinged secretions are not unusual for her. She has had a bronch in the past, and she states this has been evaluated. She has a bad cough. She states she  has not had fever . She has been taking motrin on and off. She has been off Nucala for about 18 months. Pt. States she is having problems swallowing and eating whole foods.  Pt. States she has been negative for Covid last Tuesday. I have asked her to re-test with a home test. If she is positive, we can still get her evaluated for monoclonal infusion. She has been symptomatic since Friday evening., so 6/28 will be day 5 of symptoms.She is wearing her oxygen at 2 L [place]. She has been using tessalon perles.  Sats are in the 90's. No leg pain or swelling. She was able to wean her oxygen back to 2 L after taking tessalon Perles for her cough. Primary desaturations are with cough.   She is going to make the adjustment with her cortef if she needs to start on the prednisone taper. She is currently not wheezing.      Observations/Objective:   ANA Screen 07/03/17 - Positive   Feb 2020 ANA - neg SSA/SSB - neg Anti-DNA ab -neg MPO/PR3 ab - ng GBM ab - neg Anti-scleroderma ab - neg APLAS - neg ANCA  titers - neg RF mildly elevated 18.6   Bronchoscopy: 11/19/18    11/19/18 TBFNA station 7 (mislabeled as bronchial brushing) - negative for malignant cells   11/19/18 Respiratory/fungal, AFB from BAL of L lingula - negative   Imaging, labs and test noted above have been reviewed independently by me.     Assessment and Plan: Bronchitis vs Asthma flare vs Covid pneumonia No fever per patient Negative Covid test 03/21/2021 Plan Please re-test for Covid .  If you  are positive, please call the office in the morning and let us know so we can set you up for monoclonal antibody.  I have sent in a prescription for Doxycycline Take 100 mg twice daily x 7 days. I have sent in a prescription for Albuterol nebs. Use twice daily as needed for shortness of breath or wheezing.  Use instead of Pro air.   Continue using Breo and QVar as you have been doing Rinse mouth after use  Prednisone taper; 10 mg tablets: 4 tabs x 2 days, 3 tabs x 2 days, 2 tabs x 2 days 1 tab x 2 days then stop.  Start as needed for worsening wheezing. Adjust your Cortef as needed if you feel you need to start the prednisone.  Get OTC Mucinex ( Liquid) to thin your secretions.  If you get worse not better, or develop a fever, go to the Emergency room.  Wear oxygen to maintain sats of > 88-90% We will order a flutter valve through [employer] to use 4-6 times daily . Tessalon Perles as needed for cough.  We will have Triage call you tomorrow to see how you are doing.  Plenty of fluids and rest.  Please contact office for sooner follow up if symptoms do not improve or worsen or seek emergency care   Schedule with Dr. Loanne Drilling at earliest appointment.   Chronic Respiratory failure with hypoxia Plan Wear oxygen to maintain oxygen saturations of > 90% Seek emergency care if sats drop and do not rebound quickly   Follow Up Instructions: Triage follow up 6/28 afternoon Follow up with Dr. Loanne Drilling at earliest availability    I discussed the assessment and treatment plan with the patient. The patient was provided an opportunity to ask questions and all were answered. The patient agreed with the plan and demonstrated an understanding of the instructions.   The patient was advised to call back or seek an in-person evaluation if the symptoms worsen or if the condition fails to improve as anticipated.  I provided 50 minutes minutes of non-face-to-face time during this encounter.   Magdalen Spatz,  NP 03/27/2021

## 2021-03-28 ENCOUNTER — Telehealth: Payer: Self-pay | Admitting: Pulmonary Disease

## 2021-03-28 ENCOUNTER — Encounter: Payer: Self-pay | Admitting: Hematology and Oncology

## 2021-03-28 DIAGNOSIS — R911 Solitary pulmonary nodule: Secondary | ICD-10-CM | POA: Diagnosis not present

## 2021-03-28 NOTE — Telephone Encounter (Signed)
I called and spoke to patient to see how shes feeling. Patient stated she was able to finally sleep last night. Covid test was negative. She has take 2 pills of doxycycline and used 2 nebs treatments. She knows to go to the ER symptoms worsen and will call us if symptoms persist/worsen. Nothing further needed. Will route to sarah.  Judson Roch, just [place]. Thanks!

## 2021-03-28 NOTE — Telephone Encounter (Signed)
Reviewed chart. Patient was seen by NP Groce via video visit yesterday. She was prescribed antibiotics and steroids and albuterol nebs. Please ensure she has started the neb treatments. Also please let patient know that antibiotics can take 24-48 hours before being effective. If she is doing worse then would agree with NP's prior assessment that she needs to be evaluated in ED promptly as we are limited in what we can offer for outpatient treatment.

## 2021-03-28 NOTE — Telephone Encounter (Signed)
Called and spoke with patient. She verbalized understanding. She stated that she has been using the albuterol since last night. She has noticed a slight improvement after the breathing treatment. I advised her to call us back if anything changes. She verbalized understanding.   Nothing further needed at time of call.

## 2021-03-28 NOTE — Telephone Encounter (Signed)
Noted, will try and ger her worked in.thanks!

## 2021-03-28 NOTE — Telephone Encounter (Signed)
Thanks  for checking on her. She is super sick, very complicated patient. She needs follow up with Dr. Loanne Drilling  within the month. She said she would make sure to see her as follow up for her more chronic problems but that she will need a 30 minute slot. My televisit with her was 50 minutes.

## 2021-03-28 NOTE — Telephone Encounter (Signed)
Primary Pulmonologist: Dr. Loanne Drilling Last office visit and with whom: 03/27/21 with Eric Form What do we see them for (pulmonary problems): Asthma, COPD Last OV assessment/plan: see below  Was appointment offered to patient (explain)?    Assessment and Plan: Bronchitis vs Asthma flare vs Covid pneumonia No fever per patient Negative Covid test 03/21/2021 Plan Please re-test for Covid . If you are positive, please call the office in the morning and let us know so we can set you up for monoclonal antibody. I have sent in a prescription for Doxycycline Take 100 mg twice daily x 7 days. I have sent in a prescription for Albuterol nebs. Use twice daily as needed for shortness of breath or wheezing. Use instead of Pro air.  Continue using Breo and QVar as you have been doing Rinse mouth after use Prednisone taper; 10 mg tablets: 4 tabs x 2 days, 3 tabs x 2 days, 2 tabs x 2 days 1 tab x 2 days then stop. Start as needed for worsening wheezing. Adjust your Cortef as needed if you feel you need to start the prednisone. Get OTC Mucinex ( Liquid) to thin your secretions. If you get worse not better, or develop a fever, go to the Emergency room. Wear oxygen to maintain sats of > 88-90% We will order a flutter valve through [employer] to use 4-6 times daily . Tessalon Perles as needed for cough. We will have Triage call you tomorrow to see how you are doing. Plenty of fluids and rest. Please contact office for sooner follow up if symptoms do not improve or worsen or seek emergency care   Schedule with Dr. Loanne Drilling at earliest appointment.    Chronic Respiratory failure with hypoxia Plan Wear oxygen to maintain oxygen saturations of > 90% Seek emergency care if sats drop and do not rebound quickly     Follow Up Instructions: Triage follow up 6/28 afternoon Follow up with Dr. Loanne Drilling at earliest availability          Reason for call: patient called crying and stating still feels worse.  Biggest complaint is coughing, feels more SOB and having coughing spells when breathing deep. Taking doxy and pred taper as prescribed. Using inhaler and nebs with no relief. Is spiking fevers, highest was 101, taking Motrin and keeping fever done. Took covid test yesterday was negative. Was told by sarah to go to ER if worse and patient started crying more when told this. Requesting recs. Will route to Dr. Loanne Drilling  Dr. Loanne Drilling, please advise. Thanks!  (examples of things to ask: : When did symptoms start? Fever? Cough? Productive? Color to sputum? More sputum than usual? Wheezing? Have you needed increased oxygen? Are you taking your respiratory medications? What over the counter measures have you tried?)  Allergies  Allergen Reactions   Ferrous Bisglycinate Chelate [Iron] Anaphylaxis    Only IV    Mushroom Extract Complex Anaphylaxis   Na Ferric Gluc Cplx In Sucrose Anaphylaxis   Cymbalta [Duloxetine Hcl] Swelling and Anxiety   Succinylcholine Other (See Comments)    Lock Jaw   Buprenorphine Hcl Hives   Compazine Other (See Comments)    Altered mental status Aggression   Metoclopramide Other (See Comments)    Dystonia   Morphine And Related Hives   Ondansetron Hives and Rash   Promethazine Hcl Hives   Tegaderm Ag Mesh [Silver] Rash    Old formulation only, is able to tolerate new formulation    Immunization History  Administered Date(s) Administered  Influenza, High Dose Seasonal PF 06/19/2019   Influenza, Quadrivalent, Recombinant, Inj, Pf 08/13/2018   Influenza, Seasonal, Injecte, Preservative Fre 06/23/2016, 11/12/2017, 08/13/2018   Influenza,inj,Quad PF,6+ Mos 07/04/2015, 07/04/2015, 06/23/2016, 07/03/2017   Influenza,inj,Quad PF,6-35 Mos 07/04/2015   Influenza-Unspecified 07/23/2007, 07/06/2008, 06/13/2009, 07/06/2010, 07/25/2011, 08/01/2012, 07/14/2013, 07/04/2015, 07/04/2015, 06/23/2016, 06/23/2016, 07/03/2017, 11/12/2017, 06/19/2019, 06/30/2020   Pneumococcal  Conjugate-13 03/20/2019   Pneumococcal Polysaccharide-23 02/21/2017   Pneumococcal-Unspecified 02/21/2017   Td 12/19/2010   Varicella 04/02/2007

## 2021-03-29 ENCOUNTER — Other Ambulatory Visit (HOSPITAL_COMMUNITY): Payer: Self-pay

## 2021-03-30 DIAGNOSIS — J440 Chronic obstructive pulmonary disease with acute lower respiratory infection: Secondary | ICD-10-CM | POA: Diagnosis not present

## 2021-03-30 DIAGNOSIS — J209 Acute bronchitis, unspecified: Secondary | ICD-10-CM | POA: Diagnosis not present

## 2021-03-31 ENCOUNTER — Other Ambulatory Visit (HOSPITAL_COMMUNITY): Payer: Self-pay

## 2021-04-04 ENCOUNTER — Telehealth: Payer: Self-pay | Admitting: Pulmonary Disease

## 2021-04-04 NOTE — Telephone Encounter (Signed)
Spoke with the [name]  She states she is calling to schedule appt with Dr Loanne Drilling  She says Judson Roch wanted her seen sooner than her next available on 05/11/21  Can we use a blocked spot for her?  She says her cough is much better  Please advise, thanks

## 2021-04-05 NOTE — Telephone Encounter (Signed)
Called and spoke with patient. She stated that she has taken 2 COVID tests since 6/27 and they were both negative. She has been scheduled for 04/11/21 at 10am.   Nothing further needed at time of call.

## 2021-04-05 NOTE — Telephone Encounter (Signed)
I am ok with overriding either 04/11/21 at 10 am or 04/17/21 at 3pm. 30 minute slot please. Also please ask for the date and results of her covid test. This was recommended by the NP on 03/27/21.

## 2021-04-10 ENCOUNTER — Ambulatory Visit: Payer: BC Managed Care – PPO | Admitting: Pulmonary Disease

## 2021-04-12 ENCOUNTER — Ambulatory Visit: Payer: BC Managed Care – PPO | Admitting: Cardiovascular Disease

## 2021-04-16 NOTE — Progress Notes (Signed)
Patient Care Team: Nicholas Lose, MD as PCP - General (Hematology and Oncology) Nahser, Wonda Cheng, MD as PCP - Cardiology (Cardiology) Maylon Peppers, MD (Family Medicine)  DIAGNOSIS:    ICD-10-CM   1. History of ductal carcinoma in situ (DCIS) of breast  Z86.000 Ambulatory referral to Physical Therapy    CBC with Differential (Cancer Center Only)    CMP ([facility] only)    CBC with Differential (Cancer Center Only)    CMP ([facility] only)      SUMMARY OF ONCOLOGIC HISTORY: Oncology History  Hx of Hodgkins lymphoma   Initial Diagnosis   Hx of Hodgkins lymphoma    Radiation Therapy      History of thyroid cancer   Initial Diagnosis   Thyroid cancer (HCC)    Remission       Remission       Radiation Therapy      History of cervical cancer  12/23/2018 Initial Diagnosis   History of cervical cancer    Remission       Radiation Therapy        CHIEF COMPLIANT: Follow-up of SVT, iron deficiency anemia on surveillance  INTERVAL HISTORY: [name] is a 50 y.o. 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 She presents to the clinic today for follow-up. She is in severe emotional distress today.  She is crying because she wants to see a psychiatrist but has been unable to get any appointments with a psychiatrist.  She is also experiencing some issues with aspiration and would like to see an ENT. She continues to have profound weight gain as result of lymphedema of her whole body. She is currently using Bumex with only moderate benefit.  She is here for lab work and follow-up.  ALLERGIES:  is allergic to ferrous bisglycinate chelate [iron], mushroom extract complex, na ferric gluc cplx in sucrose, cymbalta [duloxetine hcl], succinylcholine, buprenorphine hcl, compazine, metoclopramide, morphine and related, ondansetron, promethazine hcl, and tegaderm ag mesh  [silver].  MEDICATIONS:  Current Outpatient Medications  Medication Sig Dispense Refill   albuterol (2.5 MG/3ML) 0.083% NEBU 3 mL, albuterol (5 MG/ML) 0.5% NEBU 0.5 mL Inhale 3 mg into the lungs every 6 (six) hours as needed (SOB).      albuterol (PROVENTIL) (2.5 MG/3ML) 0.083% nebulizer solution Take 3 mLs (2.5 mg total) by nebulization every 6 (six) hours as needed for wheezing or shortness of breath. 75 mL 12   albuterol (VENTOLIN HFA) 108 (90 Base) MCG/ACT inhaler Inhale 2 puffs into the lungs every 6 (six) hours as needed for wheezing or shortness of breath.     ALPRAZolam (XANAX) 0.5 MG tablet Take 0.5 mg by mouth 2 (two) times daily as needed for anxiety.     alum & mag hydroxide-simeth (MAALOX/MYLANTA) 200-200-20 MG/5ML suspension Take 30 mLs by mouth daily as needed for indigestion or heartburn. For Bleeding Ulcers     aspirin 325 MG tablet Take 325 mg by mouth at bedtime.      beclomethasone (QVAR) 80 MCG/ACT inhaler Inhale 1 puff into the lungs 2 (two) times daily.      benzonatate (TESSALON) 200 MG capsule Take 1 capsule (200 mg total) by mouth 3 (three) times daily as needed for cough. 45 capsule 1   bumetanide (BUMEX) 1 MG tablet TAKE 1 TABLET BY MOUTH TWICE A DAY 180 tablet 1   colchicine 0.6 MG tablet Take 0.6 mg by mouth  daily in the afternoon.      dexlansoprazole (DEXILANT) 60 MG capsule Take 60 mg by mouth daily.     diphenhydrAMINE (BENADRYL) 50 MG/ML injection Inject 1 mL (50 mg total) into the vein every 6 (six) hours as needed (nausea). 25 mL 0   doxycycline (VIBRA-TABS) 100 MG tablet Take 1 tablet (100 mg total) by mouth 2 (two) times daily. 14 tablet 0   dronabinol (MARINOL) 2.5 MG capsule TAKE 1 CAPSULE TWICE DAILYAS NEEDED 30 capsule 0   EPINEPHRINE 0.3 mg/0.3 mL IJ SOAJ injection INJECT 0.3 MLS (0.3 MG TOTAL) INTO THE MUSCLE AS NEEDED FOR ANAPHYLAXIS. (Patient taking differently: Inject 0.3 mg into the muscle daily as needed for anaphylaxis.) 2 each 3   escitalopram  (LEXAPRO) 20 MG tablet Take 1 tablet (20 mg total) by mouth every evening. 90 tablet 3   fluticasone furoate-vilanterol (BREO ELLIPTA) 200-25 MCG/INH AEPB Inhale 1 puff into the lungs daily.     Galcanezumab-gnlm 120 MG/ML SOAJ Inject 120 mg into the skin every 28 (twenty-eight) days.      heparin lock flush 100 UNIT/ML SOLN injection USE 5 MLS AS A HEPLOCK IN PORT A CATH ONCE DAILY AFTER MEDICATION ADMINISTRATION OR AS DIRECTED BY PHYSICIAN. 150 mL 1   hydrocortisone (CORTEF) 10 MG tablet Take 1-2 tablets (10-20 mg total) by mouth See admin instructions. Take 20 mg in the am and 28m in the evening (Patient taking differently: Take 10-20 mg by mouth See admin instructions. Take 20 mg by mouth in the morning and 1104min the evening)     levothyroxine (SYNTHROID, LEVOTHROID) 112 MCG tablet Take 112 mcg by mouth daily before breakfast.     LORazepam (ATIVAN) 0.5 MG tablet TAKE 1 TO 2 TABLETS BY MOUTH TWICE A DAY AS NEEDED FOR NAUSEA 30 tablet 1   Mepolizumab (NUCALA) 100 MG SOLR Inject 100 mg into the skin every 28 (twenty-eight) days. 1 each 3   metoprolol succinate (TOPROL-XL) 25 MG 24 hr tablet Take 37.5 mg by mouth daily.      OXYGEN Inhale 3-4 L into the lungs continuous.     potassium chloride (KLOR-CON) 10 MEQ tablet Take 1 tablet (10 mEq total) by mouth daily for 21 days. 21 tablet 0   potassium chloride (KLOR-CON) 10 MEQ tablet Take 10 mEq by mouth daily as needed (low potassium).     predniSONE (DELTASONE) 10 MG tablet Take 4 tabs x 2 days, then 3 x 2d, then 2 x 2d, then 1 x 2d and STOP 20 tablet 0   Rimegepant Sulfate (NURTEC) 75 MG TBDP Take 75 mg by mouth daily as needed (Migraine).      rosuvastatin (CRESTOR) 10 MG tablet Take 10 mg by mouth daily.      silver sulfADIAZINE (SILVADENE) 1 % cream Apply 1 application topically daily as needed (Open Wound).      sodium chloride 0.9 % infusion Use as directed. 300 mL PRN   sodium chloride flush (BD POSIFLUSH) 0.9 % SOLN injection FLUSH PORT A  CATH WITH 10 ML BEFORE AND AFTER FLUIDS TO MAINTAIN PATENCY 1200 mL 1   Sodium Chloride Flush (NORMAL SALINE FLUSH) 0.9 % SOLN USE TWO 10 ML FLUSHES BEFORE AND AFTER FLUIDS AND MEDICATION ADMINISTRATION, TO MAINTAIN PATENCY--USE UP TO 4 TIMES DAILY. 2400 mL 1   sodium chloride flush 0.9 % SOLN injection FLUSH PORT A CATH WITH 10 ML BEFORE AND AFTER FLUIDS TO MAINTAIN PATENCY 10 mL 1   sodium chloride flush 0.9 %  SOLN injection FLUSH PORT A CATH WITH 10 ML SODIUM CHLORIDE BEFORE AND AFTER FLUIDS TO MAINTAIN PATENCY 1200 mL 1   sodium chloride flush 0.9 % SOLN injection USE AS DIRECTED TO FLUSH DAILY AFTER MEDICATION INFUSION 300 mL 1   sucralfate (CARAFATE) 1 g tablet Take 1 g by mouth daily as needed (FOR ULCERS).      topiramate (TOPAMAX) 50 MG tablet Take 150 mg by mouth at bedtime.      zolpidem (AMBIEN CR) 12.5 MG CR tablet Take 12.5 mg by mouth at bedtime.     Current Facility-Administered Medications  Medication Dose Route Frequency Provider Last Rate Last Admin   Mepolizumab SOLR 100 mg  100 mg Subcutaneous Q28 days Margaretha Seeds, MD   100 mg at 08/31/19 1017    PHYSICAL EXAMINATION: ECOG PERFORMANCE STATUS: 1 - Symptomatic but completely ambulatory  Vitals:   04/17/21 1101  BP: 138/82  Pulse: (!) 104  Resp: 20  Temp: 97.6 F (36.4 C)  SpO2: 99%   Filed Weights      LABORATORY DATA:  I have reviewed the data as listed CMP Latest Ref Rng & Units 04/17/2021 01/23/2021 12/09/2020  Glucose 70 - 99 mg/dL 144(H) 152(H) 94  BUN 6 - 20 mg/dL 19 28(H) 15  Creatinine 0.44 - 1.00 mg/dL 1.24(H) 1.34(H) 1.12(H)  Sodium 135 - 145 mmol/L 141 139 139  Potassium 3.5 - 5.1 mmol/L 3.3(L) 3.0(L) 3.3(L)  Chloride 98 - 111 mmol/L 103 99 103  CO2 22 - 32 mmol/L _0 Calcium 8.9 - 10.3 mg/dL 9.7 9.6 8.8(L)  Total Protein 6.5 - 8.1 g/dL 8.0 8.0 6.5  Total Bilirubin 0.3 - 1.2 mg/dL 0.3 0.3 0.7  Alkaline Phos 38 - 126 U/L 99 81 57  AST 15 - 41 U/L _1 ALT 0 - 44 U/L 40 36 32     Lab Results  Component Value Date   WBC 9.5 04/17/2021   HGB 11.6 (L) 04/17/2021   HCT 36.5 04/17/2021   MCV 80.6 04/17/2021   PLT 395 04/17/2021   NEUTROABS 5.5 04/17/2021    ASSESSMENT & PLAN:  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 She thinks that the dosage of Lexapro is not sufficient.  Her daughter has distanced herself and this is causing her severe heartache because of that she has not been able to see her granddaughter.  Return to clinic in 3 months for labs and in 6 months with labs and follow-up to see me.    Orders Placed This Encounter  Procedures   CBC with Differential ([facility] Only)    Standing Status:   Future    Standing Expiration Date:   04/17/2022   CMP ([facility] only)    Standing Status:   Future    Standing Expiration Date:   04/17/2022   CBC with Differential ([facility] Only)    Standing Status:   Future    Standing Expiration Date:   04/17/2022   CMP ([facility] only)    Standing Status:   Future    Standing Expiration Date:   04/17/2022   Ambulatory referral to Physical Therapy  Referral Priority:   Routine    Referral Type:   Physical Medicine    Referral Reason:   Specialty Services Required    Requested Specialty:   Physical Therapy    Number of Visits Requested:   1    The patient has a good understanding of the overall plan. she agrees with it. she will call with any problems that may develop before the next visit here.  Total time spent: 20 mins including face to face time and time spent for planning, charting and coordination of care  Rulon Eisenmenger, MD,  MP[facility] 04/17/2021  I, Thana Ates, am acting as scribe for Dr. Nicholas Lose.  I have reviewed the above documentation for accuracy and completeness, and I agree with the above.

## 2021-04-17 ENCOUNTER — Encounter: Payer: Self-pay | Admitting: Pulmonary Disease

## 2021-04-17 ENCOUNTER — Other Ambulatory Visit: Payer: Self-pay

## 2021-04-17 ENCOUNTER — Ambulatory Visit (INDEPENDENT_AMBULATORY_CARE_PROVIDER_SITE_OTHER): Payer: BC Managed Care – PPO | Admitting: Pulmonary Disease

## 2021-04-17 ENCOUNTER — Inpatient Hospital Stay: Payer: BC Managed Care – PPO | Attending: Hematology and Oncology

## 2021-04-17 ENCOUNTER — Inpatient Hospital Stay (HOSPITAL_BASED_OUTPATIENT_CLINIC_OR_DEPARTMENT_OTHER): Payer: BC Managed Care – PPO | Admitting: Hematology and Oncology

## 2021-04-17 VITALS — BP 120/72 | HR 103 | Ht 65.0 in | Wt 197.6 lb

## 2021-04-17 DIAGNOSIS — Z85850 Personal history of malignant neoplasm of thyroid: Secondary | ICD-10-CM | POA: Insufficient documentation

## 2021-04-17 DIAGNOSIS — Z86000 Personal history of in-situ neoplasm of breast: Secondary | ICD-10-CM

## 2021-04-17 DIAGNOSIS — J4550 Severe persistent asthma, uncomplicated: Secondary | ICD-10-CM

## 2021-04-17 DIAGNOSIS — J9611 Chronic respiratory failure with hypoxia: Secondary | ICD-10-CM | POA: Diagnosis not present

## 2021-04-17 DIAGNOSIS — E2740 Unspecified adrenocortical insufficiency: Secondary | ICD-10-CM

## 2021-04-17 DIAGNOSIS — F32A Depression, unspecified: Secondary | ICD-10-CM | POA: Diagnosis not present

## 2021-04-17 DIAGNOSIS — E876 Hypokalemia: Secondary | ICD-10-CM

## 2021-04-17 DIAGNOSIS — Z7982 Long term (current) use of aspirin: Secondary | ICD-10-CM | POA: Diagnosis not present

## 2021-04-17 DIAGNOSIS — Z7952 Long term (current) use of systemic steroids: Secondary | ICD-10-CM

## 2021-04-17 DIAGNOSIS — Z95828 Presence of other vascular implants and grafts: Secondary | ICD-10-CM

## 2021-04-17 DIAGNOSIS — Z79899 Other long term (current) drug therapy: Secondary | ICD-10-CM | POA: Diagnosis not present

## 2021-04-17 DIAGNOSIS — Z923 Personal history of irradiation: Secondary | ICD-10-CM | POA: Insufficient documentation

## 2021-04-17 DIAGNOSIS — I890 Lymphedema, not elsewhere classified: Secondary | ICD-10-CM

## 2021-04-17 DIAGNOSIS — R635 Abnormal weight gain: Secondary | ICD-10-CM

## 2021-04-17 DIAGNOSIS — N179 Acute kidney failure, unspecified: Secondary | ICD-10-CM

## 2021-04-17 LAB — CBC WITH DIFFERENTIAL (CANCER CENTER ONLY)
Abs Immature Granulocytes: 0.01 10*3/uL (ref 0.00–0.07)
Basophils Absolute: 0.1 10*3/uL (ref 0.0–0.1)
Basophils Relative: 1 %
Eosinophils Absolute: 0.2 10*3/uL (ref 0.0–0.5)
Eosinophils Relative: 2 %
HCT: 36.5 % (ref 36.0–46.0)
Hemoglobin: 11.6 g/dL — ABNORMAL LOW (ref 12.0–15.0)
Immature Granulocytes: 0 %
Lymphocytes Relative: 29 %
Lymphs Abs: 2.8 10*3/uL (ref 0.7–4.0)
MCH: 25.6 pg — ABNORMAL LOW (ref 26.0–34.0)
MCHC: 31.8 g/dL (ref 30.0–36.0)
MCV: 80.6 fL (ref 80.0–100.0)
Monocytes Absolute: 0.9 10*3/uL (ref 0.1–1.0)
Monocytes Relative: 9 %
Neutro Abs: 5.5 10*3/uL (ref 1.7–7.7)
Neutrophils Relative %: 58 %
Platelet Count: 395 10*3/uL (ref 150–400)
RBC: 4.53 MIL/uL (ref 3.87–5.11)
RDW: 16 % — ABNORMAL HIGH (ref 11.5–15.5)
WBC Count: 9.5 10*3/uL (ref 4.0–10.5)
nRBC: 0 % (ref 0.0–0.2)

## 2021-04-17 LAB — CMP (CANCER CENTER ONLY)
ALT: 40 U/L (ref 0–44)
AST: 24 U/L (ref 15–41)
Albumin: 3.6 g/dL (ref 3.5–5.0)
Alkaline Phosphatase: 99 U/L (ref 38–126)
Anion gap: 13 (ref 5–15)
BUN: 19 mg/dL (ref 6–20)
CO2: 25 mmol/L (ref 22–32)
Calcium: 9.7 mg/dL (ref 8.9–10.3)
Chloride: 103 mmol/L (ref 98–111)
Creatinine: 1.24 mg/dL — ABNORMAL HIGH (ref 0.44–1.00)
GFR, Estimated: 53 mL/min — ABNORMAL LOW (ref >60)
Glucose, Bld: 144 mg/dL — ABNORMAL HIGH (ref 70–99)
Potassium: 3.3 mmol/L — ABNORMAL LOW (ref 3.5–5.1)
Sodium: 141 mmol/L (ref 135–145)
Total Bilirubin: 0.3 mg/dL (ref 0.3–1.2)
Total Protein: 8 g/dL (ref 6.5–8.1)

## 2021-04-17 MED ORDER — FLUTICASONE FUROATE-VILANTEROL 200-25 MCG/INH IN AEPB: 1.0000 | INHALATION_SPRAY | Freq: Every day | RESPIRATORY_TRACT | 3 refills | Status: DC

## 2021-04-17 MED ORDER — BECLOMETHASONE DIPROP HFA 80 MCG/ACT IN AERB: 1.0000 | INHALATION_SPRAY | Freq: Two times a day (BID) | RESPIRATORY_TRACT | 6 refills | Status: DC

## 2021-04-17 MED ORDER — SODIUM CHLORIDE 0.9% FLUSH
10.0000 mL | INTRAVENOUS | Status: DC | PRN
  Administered 2021-04-17: 10 mL
  Filled 2021-04-17: qty 10

## 2021-04-17 MED ORDER — HEPARIN SOD (PORK) LOCK FLUSH 100 UNIT/ML IV SOLN
500.0000 [IU] | Freq: Once | INTRAVENOUS | Status: AC | PRN
Start: 2021-04-17 — End: 2021-04-17
  Administered 2021-04-17: 500 [IU]
  Filled 2021-04-17: qty 5

## 2021-04-17 NOTE — Progress Notes (Signed)
Subjective:   PATIENT ID: [name] GENDER: female DOB: [birth year], MRN: [id number]   HPI  Chief Complaint  Patient presents with   Cough    Shortness of breath. Feel much better since medication change   Reason for Visit: Follow-up  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  Synopsis: Previously followed at [facility] for cough variant asthma, subcentimeter lung nodules (stable) and chronic hypoxemia of unknown etiology. Patient had previously had concerns for pulmonary fibrosis however after no evidence of parenchymal disease on chest imaging. 2020 - Initially consulted with [facility] for hemoptysis. Work-up including bronchoscopy and CTA were negative and she has not had significant recurrence since then except mild episodes during exacerbations. Started on Nucala in 09/2019 for asthma however lost to follow-up 2021 - Multiple hospitalizations including for kidney stone pain, migraine, bacteremia 2/2 port infection, LUE cellulitis   Our last encounter was on 08/31/2019. She was started on Nucala for severe persistent asthma from December 2020 to April 2021. Since then she has been lost to follow-up in Pulmonary clinic. On review of records in 2021 she has had multiple medical issues including recurrent bacteremia and migraines and abdominal pain requiring hospitalizations/ED visits. She was seen by Pulmonary inpatient consult in 03/2020 with normal CT except for mild  atelectasis and evidence of air trapping.   She recently had an episode of chronic bronchitis/asthma exacerbation that was treated via telephone visit with doxycycline and prednisone. Her respiratory status has improved. Compliant with home oxygen concentrator with O2 sats 96% which is better than last week 86% when she was ill. She does not leave the house very often anymore. Uses a hepa filter at home. She has run out of Qvar and Breo. When she is on regular maintenance inhalers, she rarely uses albuterol. She is interested in restarting Nucala. She reports that she is potentially planning for a cardiac surgery/valve replacement that will be discussed in the future. Her last cardiologist visit was 10/2018.  She reports snoring and abnormal gurgles reported her her husband that would cause her to sit up and stop gasping. Reports feeling tired and having morning headaches however this may be related to migraines.   Social History: Has two adult daughters 31 for her 40 yeard old son with atypical cerebral palsy  Environmental exposures:  Hx chemotherapy  CareEverywhere Records Reviewed and Summarized: Pulm at [facility]      09/18/17 Bronchoscopy with BAL. Negative cultures and negative cytology.      Dr. Geraldo Docker documented on 01/31/18 that patient's chest imaging was presented in 10/2017 and her bronchial fibroelastotic scar was felt to be a nonspecific injury related to reflux or prior XRT and not related to her chronic cough. 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  recurrence. Currently has monthly port flushes.      The note also comments on PCP work-up comments on patient reporting hematuria with UA showing 1+ blood however this is not seen  on recent PCP note.  IM at [facility] -       Per  PCP note on 03/17/19, patient previously seen by Cardiology for CHF with right heart failure and pH with EF 35% however EF  normalized on echo 11/2018  Past Medical History:  Diagnosis Date   Addison's disease [facility])    Adrenal insufficiency ([place])    Anemia    Anxiety    Aortic stenosis    Aortic stenosis    Appendicitis 12/19/09   Appendicitis    Breast cancer (HCC)    STATUS POST BILATERAL MASTECTOMY. STATUS POST RECONSTRUCTION. SHE HAD SILICONE BREAST IMPLANTS AND THE LEFT IMPLANT IS LEAKING SLIGHTLY   Cellulitis of right middle finger 11/07/2018   Cervical cancer ([place]) 12/23/2018   Chest pain    CHF with right heart failure ([place]) 04/17/2017   Chronic respiratory failure with hypoxia (HCC) 12/23/2018   Cough variant asthma 04/13/2019   Depression    GERD (gastroesophageal reflux disease)    takes Dexilant and carafate and gi coctail    Headache    migraines on a daily and monthly regimen    Heart murmur    History of kidney stones    Hodgkin lymphoma ([place])    STATUS POST MANTLE RADIATION   Hodgkin's lymphoma ([place])    1987   Hypertension    Hypoxia    Necrotizing fasciitis ([place]) 12/23/2018   Non-ischemic cardiomyopathy ([place])    Osteoporosis    Palpitations    Pituitary adenoma ([place]) 12/23/2018   Pneumonia    PONV (postoperative nausea and vomiting)    Pre-diabetes    per pt; no meds   Pulmonary hypertension ([place]) 12/23/2018   Raynaud phenomenon    Right heart failure ([place]) 04/17/2017   Seizures ([place])    last febrile seizure was approx 3 weeks ago per report on 12/01/2020   Sleep apnea    upcoming sleep study per pt    Supplemental oxygen dependent    3 liters   SVT (supraventricular tachycardia) (HCC)    Tachycardia    Thyroid cancer ([place])    STATUS POST SURGICAL REMOVAL-CURRENT ON THYROID REPLACEMENT     Allergies  Allergen Reactions   Ferrous Bisglycinate Chelate [Iron] Anaphylaxis    Only IV    Mushroom Extract Complex Anaphylaxis   Na Ferric Gluc Cplx In Sucrose Anaphylaxis   Cymbalta [Duloxetine Hcl] Swelling and Anxiety   Succinylcholine Other (See Comments)    Lock Jaw    Buprenorphine Hcl Hives   Compazine Other (See Comments)    Altered mental status Aggression   Metoclopramide Other (See Comments)    Dystonia   Morphine And Related Hives   Ondansetron Hives and Rash   Promethazine Hcl Hives   Tegaderm Ag Mesh [Silver] Rash    Old formulation only, is able to tolerate new formulation     Outpatient Medications Prior to Visit  Medication Sig Dispense Refill   albuterol (2.5 MG/3ML) 0.083% NEBU 3 mL, albuterol (5 MG/ML) 0.5% NEBU 0.5 mL Inhale 3 mg into the lungs every 6 (six) hours as needed (SOB).      albuterol (PROVENTIL) (2.5 MG/3ML) 0.083% nebulizer solution Take 3 mLs (2.5 mg total) by nebulization every 6 (six) hours as needed for wheezing or shortness of breath. 75 mL 12   albuterol (VENTOLIN HFA) 108 (90 Base) MCG/ACT inhaler Inhale 2 puffs into the lungs every 6 (six) hours as needed for wheezing or shortness of breath.     ALPRAZolam (  XANAX) 0.5 MG tablet Take 0.5 mg by mouth 2 (two) times daily as needed for anxiety.     alum & mag hydroxide-simeth (MAALOX/MYLANTA) 200-200-20 MG/5ML suspension Take 30 mLs by mouth daily as needed for indigestion or heartburn. For Bleeding Ulcers     aspirin 325 MG tablet Take 325 mg by mouth at bedtime.      beclomethasone (QVAR) 80 MCG/ACT inhaler Inhale 1 puff into the lungs 2 (two) times daily.      benzonatate (TESSALON) 200 MG capsule Take 1 capsule (200 mg total) by mouth 3 (three) times daily as needed for cough. 45 capsule 1   bumetanide (BUMEX) 1 MG tablet TAKE 1 TABLET BY MOUTH TWICE A DAY 180 tablet 1   colchicine 0.6 MG tablet Take 0.6 mg by mouth daily in the afternoon.      dexlansoprazole (DEXILANT) 60 MG capsule Take 60 mg by mouth daily.     diphenhydrAMINE (BENADRYL) 50 MG/ML injection Inject 1 mL (50 mg total) into the vein every 6 (six) hours as needed (nausea). 25 mL 0   doxycycline (VIBRA-TABS) 100 MG tablet Take 1 tablet (100 mg total) by mouth 2 (two) times daily. 14 tablet 0    dronabinol (MARINOL) 2.5 MG capsule TAKE 1 CAPSULE TWICE DAILYAS NEEDED 30 capsule 0   EPINEPHRINE 0.3 mg/0.3 mL IJ SOAJ injection INJECT 0.3 MLS (0.3 MG TOTAL) INTO THE MUSCLE AS NEEDED FOR ANAPHYLAXIS. (Patient taking differently: Inject 0.3 mg into the muscle daily as needed for anaphylaxis.) 2 each 3   escitalopram (LEXAPRO) 20 MG tablet Take 1 tablet (20 mg total) by mouth every evening. 90 tablet 3   fluticasone furoate-vilanterol (BREO ELLIPTA) 200-25 MCG/INH AEPB Inhale 1 puff into the lungs daily.     Galcanezumab-gnlm 120 MG/ML SOAJ Inject 120 mg into the skin every 28 (twenty-eight) days.      heparin lock flush 100 UNIT/ML SOLN injection USE 5 MLS AS A HEPLOCK IN PORT A CATH ONCE DAILY AFTER MEDICATION ADMINISTRATION OR AS DIRECTED BY PHYSICIAN. 150 mL 1   hydrocortisone (CORTEF) 10 MG tablet Take 1-2 tablets (10-20 mg total) by mouth See admin instructions. Take 20 mg in the am and 36m in the evening (Patient taking differently: Take 10-20 mg by mouth See admin instructions. Take 20 mg by mouth in the morning and 164min the evening)     levothyroxine (SYNTHROID, LEVOTHROID) 112 MCG tablet Take 112 mcg by mouth daily before breakfast.     LORazepam (ATIVAN) 0.5 MG tablet TAKE 1 TO 2 TABLETS BY MOUTH TWICE A DAY AS NEEDED FOR NAUSEA 30 tablet 1   Mepolizumab (NUCALA) 100 MG SOLR Inject 100 mg into the skin every 28 (twenty-eight) days. 1 each 3   metoprolol succinate (TOPROL-XL) 25 MG 24 hr tablet Take 37.5 mg by mouth daily.      OXYGEN Inhale 3-4 L into the lungs continuous.     potassium chloride (KLOR-CON) 10 MEQ tablet Take 1 tablet (10 mEq total) by mouth daily for 21 days. 21 tablet 0   potassium chloride (KLOR-CON) 10 MEQ tablet Take 10 mEq by mouth daily as needed (low potassium).     predniSONE (DELTASONE) 10 MG tablet Take 4 tabs x 2 days, then 3 x 2d, then 2 x 2d, then 1 x 2d and STOP 20 tablet 0   Rimegepant Sulfate (NURTEC) 75 MG TBDP Take 75 mg by mouth daily as needed  (Migraine).      rosuvastatin (CRESTOR) 10 MG  tablet Take 10 mg by mouth daily.      silver sulfADIAZINE (SILVADENE) 1 % cream Apply 1 application topically daily as needed (Open Wound).      sodium chloride 0.9 % infusion Use as directed. 300 mL PRN   sodium chloride flush (BD POSIFLUSH) 0.9 % SOLN injection FLUSH PORT A CATH WITH 10 ML BEFORE AND AFTER FLUIDS TO MAINTAIN PATENCY 1200 mL 1   Sodium Chloride Flush (NORMAL SALINE FLUSH) 0.9 % SOLN USE TWO 10 ML FLUSHES BEFORE AND AFTER FLUIDS AND MEDICATION ADMINISTRATION, TO MAINTAIN PATENCY--USE UP TO 4 TIMES DAILY. 2400 mL 1   sodium chloride flush 0.9 % SOLN injection FLUSH PORT A CATH WITH 10 ML BEFORE AND AFTER FLUIDS TO MAINTAIN PATENCY 10 mL 1   sodium chloride flush 0.9 % SOLN injection FLUSH PORT A CATH WITH 10 ML SODIUM CHLORIDE BEFORE AND AFTER FLUIDS TO MAINTAIN PATENCY 1200 mL 1   sodium chloride flush 0.9 % SOLN injection USE AS DIRECTED TO FLUSH DAILY AFTER MEDICATION INFUSION 300 mL 1   sucralfate (CARAFATE) 1 g tablet Take 1 g by mouth daily as needed (FOR ULCERS).      topiramate (TOPAMAX) 50 MG tablet Take 150 mg by mouth at bedtime.      zolpidem (AMBIEN CR) 12.5 MG CR tablet Take 12.5 mg by mouth at bedtime.     Facility-Administered Medications Prior to Visit  Medication Dose Route Frequency Provider Last Rate Last Admin   Mepolizumab SOLR 100 mg  100 mg Subcutaneous Q28 days Margaretha Seeds, MD   100 mg at 08/31/19 1017    Review of Systems  Constitutional:  Positive for malaise/fatigue. Negative for chills, diaphoresis, fever and weight loss.  HENT:  Negative for congestion.   Respiratory:  Positive for cough and shortness of breath. Negative for hemoptysis, sputum production and wheezing.   Cardiovascular:  Negative for chest pain, palpitations and leg swelling.  Neurological:  Positive for headaches.  Objective:   Vitals:   04/17/21 1517  BP: 120/72  Pulse: (!) 103  SpO2: 98%  Weight: 197 lb 9.6 oz (89.6 kg)   Height: _0  (1.651 m)      Physical Exam: General: Well-appearing, no acute distress HENT: [place], AT Eyes: EOMI, no scleral icterus Respiratory: Clear to auscultation bilaterally.  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 No infiltrate, edema or effusion. CT 05/05/09 - Normal parenchymal. Scattered tiny pulmonary nodules with largest 17m in LUL likely benign CT Chest 09/05/17 (report only): Stable bilateral pulmonary nodules up to 448m(LUL 38m5mLUL 2 mm, RLL subpleural 4mm41mredemonstration of tiny right diaphragmatic hernia containing fat CT Chest 11/10/18 - RML 5 mm lung nodule, unchanged  PFT: 04/16/19 FVC 2.42 (65%) FEV1 2.03 (68%) Ratio 76  TLC 84% DLCO 75% Interpretation: Moderate reversible obstructive defect. Significant BD response in FEV1.  CPET Per [facility] records  CPET 03/2016 showed functional impairment with VO2max61m% predicted. Normal oxygenation throughout on room air with post-exercise PaO2=125. HR 82% predicted. Abnormal FEV1 but ventilatory reserve remained. Conclusion: Cardiovascular limitation to exercise possibly reflecting deconditioning. Ventilatory impairment present but not exercise-limiting.  6 min walk  Per [facility] records (12/07/16): SpO2 started at 97-98% and decreased to ~94% with ambulation. There was one episode of symptomatic dizziness during which  SpO2 was 88% but with questionable tracing. Recovered to high-90s with ~10-15 seconds of standing rest break.  [facility] 08/13/19  Unable to complete 6MWT. Patient had episode of coughing while walking and dizziness resulting in witnessed fall. Staff reported patient was disoriented and confused.  POC glucose was 109. Pulse oximetry >96% and tachycardia 110. No  desaturations documented during this time.  Ambulatory O2 Titration 04/13/19 - Patient unable to complete due to dizziness with ambulation. No desaturations documented. SpO2>95% in the time titration was attempted on room air. Per nursing, patient anxious and holding breath during testing which likely contributed to early termination of test  Labs:  ANA Screen 07/03/17 - Positive  Feb 2020 ANA - neg SSA/SSB - neg Anti-DNA ab -neg MPO/PR3 ab - ng GBM ab - neg Anti-scleroderma ab - neg APLAS - neg ANCA titers - neg RF mildly elevated 18.6  Bronchoscopy: 11/19/18   11/19/18 TBFNA station 7 (mislabeled as bronchial brushing) - negative for malignant cells  11/19/18 Respiratory/fungal, AFB from BAL of L lingula - negative    Assessment & Plan:   Discussion: 50 year old female with complex medical history as noted above. Previously followed at [facility] for cough variant asthma, subcentimeter lung nodules (stable) and chronic hypoxemia of unknown etiology. Patient had previously had concerns for pulmonary fibrosis however after no evidence of parenchymal disease on chest imaging.  She was initially established with [facility] for hemoptysis. Work-up including bronchoscopy and CTA were negative and she has not had significant recurrence since then except mild episodes during exacerbations. For her asthma she was started on Nucala in 09/2019. Lost to follow-up and re-establishing care today  We discussed the clinical course of her asthma and management including adherence to bronchodilators, minimizing exacerbations and active lifestyle.  Moderate persistent eosinophilic asthma CONTINUE Breo 200-25-mcg ONE puff ONCE a day. REFILL CONTINUE Qvar 80 mcg ONE puff TWICE a day. REFILL CONTINUE Albuterol as needed for shortness of breath or wheezing Will message staff to re-enroll in [place] Refer to Pulmonary Rehab to discuss options  Chronic hypoxemic respiratory failure CONTINUE  supplemental oxygen for goal greater than 88%.  Try to give yourself a break on lower oxygen levels as long as it is above 88%.  Lymphedema Diuresis per Oncology  Snoring ORDER sleep study  Once you and your cardiologist have definitively planned for surgery, please schedule to see me for pre-op evaluation  Health Maintenance Immunization History  Administered Date(s) Administered   Influenza, High Dose Seasonal PF 06/19/2019   Influenza, Quadrivalent, Recombinant, Inj, Pf 08/13/2018   Influenza, Seasonal, Injecte, Preservative Fre 06/23/2016, 11/12/2017, 08/13/2018   Influenza,inj,Quad PF,6+ Mos 07/04/2015, 07/04/2015, 06/23/2016, 07/03/2017   Influenza,inj,Quad PF,6-35 Mos 07/04/2015   Influenza-Unspecified 07/23/2007, 07/06/2008, 06/13/2009, 07/06/2010, 07/25/2011, 08/01/2012, 07/14/2013, 07/04/2015, 07/04/2015, 06/23/2016, 06/23/2016, 07/03/2017, 11/12/2017, 06/19/2019, 06/30/2020   Moderna Sars-Covid-2 Vaccination 12/15/2019, 01/12/2020   Pneumococcal Conjugate-13 03/20/2019   Pneumococcal Polysaccharide-23 02/21/2017   Pneumococcal-Unspecified 02/21/2017   Td 12/19/2010   Varicella 04/02/2007   CT Lung Screen - not qualified. Never smoker  Orders Placed This Encounter  Procedures   Home sleep test    Standing Status:   Future    Standing Expiration Date:   04/17/2022    Order Specific Question:   Where should this test be performed:    Answer:   [facility] - Pulmonary   Meds ordered this encounter  Medications   fluticasone furoate-vilanterol (BREO ELLIPTA) 200-25 MCG/INH AEPB    Sig: Inhale 1 puff into the lungs  daily.    Dispense:  60 each    Refill:  3   beclomethasone (QVAR) 80 MCG/ACT inhaler    Sig: Inhale 1 puff into the lungs 2 (two) times daily.    Dispense:  1 each    Refill:  6   Return in about 3 months (around 07/18/2021).  I have spent a total time of 40-minutes on the day of the appointment reviewing prior documentation, coordinating care and discussing  medical diagnosis and plan with the patient/family. Past medical history, allergies, medications were reviewed. Pertinent imaging, labs and tests included in this note have been reviewed and interpreted independently by me.   [place], MD [facility] 04/17/2021 1:11 PM  Office Number [phone]

## 2021-04-17 NOTE — Progress Notes (Signed)
Contacted [facility] for pt as [facility] is not currently accepting new pts. I provided pt with phone number to [facility].

## 2021-04-17 NOTE — Assessment & Plan Note (Signed)
History of DCIS, thyroid cancer, cervical cancer, Hodgkin's lymphoma: In remission Lymphedema: Currently on Bumex. Renal insufficiency due to Bumex: Creatinine: 1.34 on 01/23/21 History of anemia: Hemoglobin appears to be stable at 12.1 (01/23/21).

## 2021-04-17 NOTE — Patient Instructions (Addendum)
  Moderate persistent asthma CONTINUE Breo 200-25-mcg ONE puff ONCE a day. REFILL CONTINUE Qvar 80 mcg ONE puff TWICE a day. REFILL CONTINUE Albuterol as needed for shortness of breath or wheezing Will message staff to re-enroll in Nucala  Chronic hypoxemic respiratory failure CONTINUE supplemental oxygen for goal greater than 88%.  Try to give yourself a break on lower oxygen levels as long as it is above 88%.  Lymphedema Diuresis per Oncology  Snoring ORDER sleep study  Once you and your cardiologist have definitively planned for surgery, please schedule to see me for pre-op evaluation  Follow-up with me in 3 months

## 2021-04-18 ENCOUNTER — Other Ambulatory Visit: Payer: Self-pay

## 2021-04-18 ENCOUNTER — Other Ambulatory Visit (HOSPITAL_COMMUNITY): Payer: Self-pay

## 2021-04-18 ENCOUNTER — Telehealth: Payer: Self-pay | Admitting: Hematology and Oncology

## 2021-04-18 ENCOUNTER — Other Ambulatory Visit: Payer: Self-pay | Admitting: Hematology and Oncology

## 2021-04-18 DIAGNOSIS — T17928A Food in respiratory tract, part unspecified causing other injury, initial encounter: Secondary | ICD-10-CM

## 2021-04-18 LAB — THYROID PANEL WITH TSH
Free Thyroxine Index: 1.7 (ref 1.2–4.9)
T3 Uptake Ratio: 29 % (ref 24–39)
T4, Total: 5.7 ug/dL (ref 4.5–12.0)
TSH: 1.21 u[IU]/mL (ref 0.450–4.500)

## 2021-04-18 MED ORDER — NORMAL SALINE FLUSH 0.9 % IV SOLN
INTRAVENOUS | 1 refills | Status: DC
  Filled 2021-04-18: qty 2400, 15d supply, fill #0
  Filled 2021-05-18: qty 2400, 15d supply, fill #1

## 2021-04-18 NOTE — Progress Notes (Signed)
RN spoke with Dr. Gaetana Michaelis office regarding referral for patient for frequent aspiration.   Patient is scheduled for 05/11/2021 @ 8am - Location is [address].  Office will mail new patient paperwork for patient to complete prior to appointment.   Patient notified via [employer].

## 2021-04-18 NOTE — Telephone Encounter (Signed)
Scheduled per 7/18 los. Called pt and left a msg

## 2021-04-19 ENCOUNTER — Other Ambulatory Visit (HOSPITAL_COMMUNITY): Payer: Self-pay

## 2021-04-20 ENCOUNTER — Ambulatory Visit: Payer: BC Managed Care – PPO | Admitting: Physical Therapy

## 2021-04-21 ENCOUNTER — Other Ambulatory Visit: Payer: Self-pay | Admitting: Hematology and Oncology

## 2021-04-21 ENCOUNTER — Encounter: Payer: Self-pay | Admitting: Hematology and Oncology

## 2021-04-21 DIAGNOSIS — R110 Nausea: Secondary | ICD-10-CM

## 2021-04-24 ENCOUNTER — Other Ambulatory Visit: Payer: BC Managed Care – PPO

## 2021-04-24 ENCOUNTER — Ambulatory Visit: Payer: BC Managed Care – PPO | Admitting: Hematology and Oncology

## 2021-04-27 ENCOUNTER — Other Ambulatory Visit (HOSPITAL_BASED_OUTPATIENT_CLINIC_OR_DEPARTMENT_OTHER): Payer: Self-pay

## 2021-04-27 DIAGNOSIS — R911 Solitary pulmonary nodule: Secondary | ICD-10-CM | POA: Diagnosis not present

## 2021-05-01 DIAGNOSIS — I50810 Right heart failure, unspecified: Secondary | ICD-10-CM

## 2021-05-03 ENCOUNTER — Ambulatory Visit: Payer: BC Managed Care – PPO | Admitting: Cardiovascular Disease

## 2021-05-04 ENCOUNTER — Ambulatory Visit (HOSPITAL_COMMUNITY): Payer: BC Managed Care – PPO

## 2021-05-04 ENCOUNTER — Other Ambulatory Visit: Payer: Self-pay | Admitting: Hematology and Oncology

## 2021-05-04 ENCOUNTER — Other Ambulatory Visit (HOSPITAL_COMMUNITY): Payer: Self-pay

## 2021-05-04 MED ORDER — HEPARIN SOD (PORK) LOCK FLUSH 100 UNIT/ML IV SOLN
INTRAVENOUS | 1 refills | Status: DC
  Filled 2021-05-04: qty 150, 30d supply, fill #0
  Filled 2021-06-01: qty 150, 30d supply, fill #1

## 2021-05-05 ENCOUNTER — Other Ambulatory Visit (HOSPITAL_COMMUNITY): Payer: Self-pay

## 2021-05-08 ENCOUNTER — Ambulatory Visit (HOSPITAL_COMMUNITY)
Admission: RE | Admit: 2021-05-08 | Discharge: 2021-05-08 | Disposition: A | Discharge status: Home / Self Care | Payer: BC Managed Care – PPO | Source: Ambulatory Visit | Attending: Cardiovascular Disease | Admitting: Cardiovascular Disease

## 2021-05-08 ENCOUNTER — Ambulatory Visit: Payer: BC Managed Care – PPO

## 2021-05-08 ENCOUNTER — Other Ambulatory Visit: Payer: Self-pay

## 2021-05-08 ENCOUNTER — Encounter: Payer: Self-pay | Admitting: Hematology and Oncology

## 2021-05-08 ENCOUNTER — Encounter: Payer: Self-pay | Admitting: Adult Health

## 2021-05-08 ENCOUNTER — Ambulatory Visit (INDEPENDENT_AMBULATORY_CARE_PROVIDER_SITE_OTHER): Payer: BC Managed Care – PPO | Admitting: Adult Health

## 2021-05-08 VITALS — BP 125/73 | HR 93 | Ht 66.0 in | Wt 193.0 lb

## 2021-05-08 DIAGNOSIS — I350 Nonrheumatic aortic (valve) stenosis: Secondary | ICD-10-CM

## 2021-05-08 DIAGNOSIS — I082 Rheumatic disorders of both aortic and tricuspid valves: Secondary | ICD-10-CM | POA: Insufficient documentation

## 2021-05-08 DIAGNOSIS — F411 Generalized anxiety disorder: Secondary | ICD-10-CM

## 2021-05-08 DIAGNOSIS — F331 Major depressive disorder, recurrent, moderate: Secondary | ICD-10-CM | POA: Diagnosis not present

## 2021-05-08 DIAGNOSIS — I351 Nonrheumatic aortic (valve) insufficiency: Secondary | ICD-10-CM | POA: Diagnosis not present

## 2021-05-08 DIAGNOSIS — I110 Hypertensive heart disease with heart failure: Secondary | ICD-10-CM | POA: Diagnosis not present

## 2021-05-08 DIAGNOSIS — G4700 Insomnia, unspecified: Secondary | ICD-10-CM | POA: Diagnosis not present

## 2021-05-08 DIAGNOSIS — Z853 Personal history of malignant neoplasm of breast: Secondary | ICD-10-CM | POA: Diagnosis not present

## 2021-05-08 DIAGNOSIS — F410 Panic disorder [episodic paroxysmal anxiety] without agoraphobia: Secondary | ICD-10-CM

## 2021-05-08 DIAGNOSIS — R011 Cardiac murmur, unspecified: Secondary | ICD-10-CM | POA: Diagnosis not present

## 2021-05-08 DIAGNOSIS — I50810 Right heart failure, unspecified: Secondary | ICD-10-CM | POA: Insufficient documentation

## 2021-05-08 DIAGNOSIS — I272 Pulmonary hypertension, unspecified: Secondary | ICD-10-CM | POA: Diagnosis not present

## 2021-05-08 DIAGNOSIS — R079 Chest pain, unspecified: Secondary | ICD-10-CM | POA: Diagnosis not present

## 2021-05-08 LAB — ECHOCARDIOGRAM COMPLETE
AR max vel: 0.92 cm2
AV Area VTI: 0.9 cm2
AV Area mean vel: 0.87 cm2
AV Mean grad: 11 mmHg
AV Peak grad: 17.3 mmHg
Ao pk vel: 2.08 m/s
Area-P 1/2: 3.12 cm2
S' Lateral: 2.9 cm
Single Plane A4C EF: 50.9 %

## 2021-05-08 MED ORDER — ARIPIPRAZOLE 2 MG PO TABS
2.0000 mg | ORAL_TABLET | Freq: Every day | ORAL | 2 refills | Status: DC
Start: 2021-05-08 — End: 2021-05-29

## 2021-05-08 NOTE — Progress Notes (Signed)
[facility] MD/PA/NP Initial Note  05/08/2021 4:15 PM [name]  MRN:  [id number]  Chief Complaint:   HPI:   Referred by Oncology.  Describes mood today as "not good". Pleasant. Tearful throughout interview. Mood symptoms - reports depression, anxiety, and irritability. Reports panic attacks - "I had one for the first time in a long time yesterday". Does not feel like current medication is working well. Asked her Oncologist for a referral to discuss options. Has taken Wellbutrin and Zoloft in the past and did not tolerate or think they were helpful. Started on Lexapro, now taking 37m daily - helpful initially. Panic attacks - history of taking Clonazepam. Currently Xanax 0.589mtwice daily. Having difficulties with mood stabilization. Stating "my mood is up and down - more down lately". Fighting cancer since age 50 Hodgkins stage 4.32Also diagnosed  with multiple medical issues. Has been talking to an onConservation officer, natureMostly staying home - doesn't like leaving the house. Not wanting to be around others. Stating "I'm not happy with how I look - has gained a lot of weight. Did go down to the beach, but stayed in the room and looked out the window at the ocean - "that was the first time I've left the house. [place]. Has not seen daughter in a year or met her granddaughter. Decreased interest and motivation. Taking medications as prescribed.  Energy levels low - barely able to walk. Active, does not have a regular exercise routine with current physical disabilities. Enjoys some usual interests and activities. Married. Lives with husband. Has a 2550ear old daughter and a 1562ear old son. Spending time with family.  Appetite decreased - aspirates. Weight gain. Sleeps well most nights. Averages 8 hours with Ambien. Denies daytime napping. Focus and concentration stable. Completing tasks. Managing aspects of household. Disabled in 2016 - HuOffice DepotDenies SI or HI.  Denies AH or VH.  Previous medication trials: Wellbutrin - mean, Zoloft-not helpful  Visit Diagnosis:    ICD-10-CM   1. Major depressive disorder, recurrent episode, moderate (HCC)  F33.1 ARIPiprazole (ABILIFY) 2 MG tablet    2. Generalized anxiety disorder  F41.1 ARIPiprazole (ABILIFY) 2 MG tablet    3. Insomnia, unspecified type  G47.00 ARIPiprazole (ABILIFY) 2 MG tablet      Past Psychiatric History: Admitted at age 50or refusingor refusing to take her chemotherapy. [place]  Past Medical History:  Past Medical History:  Diagnosis Date   [place]ison's disease ([place]   Adrenal insufficiency ([place]   Anemia    Anxiety    Aortic stenosis    Aortic stenosis    Appendicitis 12/19/09   Appendicitis    Breast cancer (HCC)    STATUS POST BILATERAL MASTECTOMY. STATUS POST RECONSTRUCTION. SHE HAD SILICONE BREAST IMPLANTS AND THE LEFT IMPLANT IS LEAKING SLIGHTLY   Cellulitis of right middle finger 11/07/2018   Cervical cancer ([place]3/24/2020   Chest pain    CHF with right heart failure ([place]7/18/2018   Chronic respiratory failure with hypoxia (HCC) 12/23/2018   Cough variant asthma 04/13/2019   Depression    GERD (gastroesophageal reflux disease)    takes Dexilant and carafate and gi coctail    Headache    migraines on a daily and monthly regimen    Heart murmur    History of kidney stones    Hodgkin lymphoma (HC50   STATUS  POST MANTLE RADIATION   Hodgkin's lymphoma ([place])    1987   Hypertension    Hypoxia    Necrotizing fasciitis ([place]) 12/23/2018   Non-ischemic cardiomyopathy ([place])    Osteoporosis    Palpitations    Pituitary adenoma ([place]) 12/23/2018   Pneumonia    PONV (postoperative nausea and vomiting)    Pre-diabetes    per pt; no meds   Pulmonary hypertension ([place]) 12/23/2018   Raynaud phenomenon    Right heart failure ([place]) 04/17/2017   Seizures ([place])    last febrile seizure was approx 3 weeks ago  per report on 12/01/2020   Sleep apnea    upcoming sleep study per pt    Supplemental oxygen dependent    3 liters   SVT (supraventricular tachycardia) (HCC)    Tachycardia    Thyroid cancer ([place])    STATUS POST SURGICAL REMOVAL-CURRENT ON THYROID REPLACEMENT    Past Surgical History:  Procedure Laterality Date   ABDOMINAL HYSTERECTOMY     AMPUTATION Left 01/30/2019   Procedure: Left Index finger amputation with flap reconstruction and repair reconstruction;  Surgeon: Roseanne Kaufman, MD;  Location: [facility];  Service: Orthopedics;  Laterality: Left;   APPENDECTOMY     breast implants and removal      breast implants but leaking      CARDIAC CATHETERIZATION  05/18/09   NORMAL CATH   COLONOSCOPY     hx of chemotherapy      hx of radiation therapy      I & D EXTREMITY Left 12/23/2018   Procedure: IRRIGATION AND DEBRIDEMENT HAND / INDEX FINGER;  Surgeon: Roseanne Kaufman, MD;  Location: [facility];  Service: Orthopedics;  Laterality: Left;   IR IMAGING GUIDED PORT INSERTION  05/06/2020   IR REMOVAL TUN ACCESS W/ PORT W/O FL MOD SED  04/27/2020   KIDNEY STONE SURGERY     LUMBAR PUNCTURE W/ INTRATHECAL CHEMOTHERAPY     MASTECTOMY     PITUITARY SURGERY     RIGHT/LEFT HEART CATH AND CORONARY ANGIOGRAPHY N/A 04/02/2018   Procedure: RIGHT/LEFT HEART CATH AND CORONARY ANGIOGRAPHY;  Surgeon: Burnell Blanks, MD;  Location: [facility];  Service: Cardiovascular;  Laterality: N/A;   TOOTH EXTRACTION N/A 12/05/2020   Procedure: DENTAL RESTORATION/EXTRACTIONS;  Surgeon: Ronal Fear, MD;  Location: [facility];  Service: Oral Surgery;  Laterality: N/A;   TOTAL THYROIDECTOMY     VIDEO BRONCHOSCOPY Bilateral 11/14/2018   Procedure: VIDEO BRONCHOSCOPY WITHOUT FLUORO;  Surgeon: Margaretha Seeds, MD;  Location: [facility];  Service: Cardiopulmonary;  Laterality: Bilateral;   VIDEO BRONCHOSCOPY WITH ENDOBRONCHIAL ULTRASOUND N/A 11/19/2018   Procedure: VIDEO BRONCHOSCOPY WITH ENDOBRONCHIAL  ULTRASOUND;  Surgeon: Margaretha Seeds, MD;  Location: [facility];  Service: Thoracic;  Laterality: N/A;    Family Psychiatric History: Unknown family history of mental illness - adopted.  Family History:  Family History  Family history unknown: Yes    Social History:  Social History   Socioeconomic History   Marital status: Married    Spouse name: Not on file   Number of children: Not on file   Years of education: Not on file   Highest education level: Not on file  Occupational History   Not on file  Tobacco Use   Smoking status: Never   Smokeless tobacco: Never  Vaping Use   Vaping Use: Never used  Substance and Sexual Activity   Alcohol use: Not Currently    Comment: social    Drug  use: No   Sexual activity: Yes    Birth control/protection: Surgical  Other Topics Concern   Not on file  Social History Narrative   Not on file   Social Determinants of Health   Financial Resource Strain: Not on file  Food Insecurity: Not on file  Transportation Needs: Not on file  Physical Activity: Not on file  Stress: Not on file  Social Connections: Not on file    Allergies:  Allergies  Allergen Reactions   Ferrous Bisglycinate Chelate [Iron] Anaphylaxis    Only IV    Mushroom Extract Complex Anaphylaxis   Na Ferric Gluc Cplx In Sucrose Anaphylaxis   Cymbalta [Duloxetine Hcl] Swelling and Anxiety   Succinylcholine Other (See Comments)    Lock Jaw   Buprenorphine Hcl Hives   Compazine Other (See Comments)    Altered mental status Aggression   Metoclopramide Other (See Comments)    Dystonia   Morphine And Related Hives   Ondansetron Hives and Rash   Promethazine Hcl Hives   Tegaderm Ag Mesh [Silver] Rash    Old formulation only, is able to tolerate new formulation    Metabolic Disorder Labs: Lab Results  Component Value Date   HGBA1C 6.8 (H) 12/05/2020   MPG 148.46 12/05/2020   MPG 134.11 09/22/2020   No results found for: PROLACTIN Lab Results  Component  Value Date   CHOL 178 09/22/2020   TRIG 253 (H) 09/22/2020   HDL 44 09/22/2020   CHOLHDL 4.0 09/22/2020   VLDL 51 (H) 09/22/2020   LDLCALC 83 09/22/2020   LDLCALC 107 (H) 01/23/2018   Lab Results  Component Value Date   TSH 1.210 04/17/2021   TSH 0.190 (L) 01/23/2021    Therapeutic Level Labs: No results found for: LITHIUM No results found for: VALPROATE No components found for:  CBMZ  Current Medications: Current Outpatient Medications  Medication Sig Dispense Refill   ARIPiprazole (ABILIFY) 2 MG tablet Take 1 tablet (2 mg total) by mouth daily. 30 tablet 2   albuterol (2.5 MG/3ML) 0.083% NEBU 3 mL, albuterol (5 MG/ML) 0.5% NEBU 0.5 mL Inhale 3 mg into the lungs every 6 (six) hours as needed (SOB).      albuterol (PROVENTIL) (2.5 MG/3ML) 0.083% nebulizer solution Take 3 mLs (2.5 mg total) by nebulization every 6 (six) hours as needed for wheezing or shortness of breath. 75 mL 12   albuterol (VENTOLIN HFA) 108 (90 Base) MCG/ACT inhaler Inhale 2 puffs into the lungs every 6 (six) hours as needed for wheezing or shortness of breath.     ALPRAZolam (XANAX) 0.5 MG tablet Take 0.5 mg by mouth 2 (two) times daily as needed for anxiety.     alum & mag hydroxide-simeth (MAALOX/MYLANTA) 200-200-20 MG/5ML suspension Take 30 mLs by mouth daily as needed for indigestion or heartburn. For Bleeding Ulcers     aspirin 325 MG tablet Take 325 mg by mouth at bedtime.      beclomethasone (QVAR) 80 MCG/ACT inhaler Inhale 1 puff into the lungs 2 (two) times daily.      beclomethasone (QVAR) 80 MCG/ACT inhaler Inhale 1 puff into the lungs 2 (two) times daily. 1 each 6   benzonatate (TESSALON) 200 MG capsule Take 1 capsule (200 mg total) by mouth 3 (three) times daily as needed for cough. 45 capsule 1   bumetanide (BUMEX) 1 MG tablet TAKE 1 TABLET BY MOUTH TWICE A DAY 180 tablet 1   colchicine 0.6 MG tablet Take 0.6 mg by mouth daily  in the afternoon.      dexlansoprazole (DEXILANT) 60 MG capsule Take 60  mg by mouth daily.     diphenhydrAMINE (BENADRYL) 50 MG/ML injection Inject 1 mL (50 mg total) into the vein every 6 (six) hours as needed (nausea). 25 mL 0   doxycycline (VIBRA-TABS) 100 MG tablet Take 1 tablet (100 mg total) by mouth 2 (two) times daily. 14 tablet 0   dronabinol (MARINOL) 2.5 MG capsule TAKE 1 CAPSULE TWICE DAILYAS NEEDED 30 capsule 0   EPINEPHRINE 0.3 mg/0.3 mL IJ SOAJ injection INJECT 0.3 MLS (0.3 MG TOTAL) INTO THE MUSCLE AS NEEDED FOR ANAPHYLAXIS. (Patient taking differently: Inject 0.3 mg into the muscle daily as needed for anaphylaxis.) 2 each 3   escitalopram (LEXAPRO) 20 MG tablet Take 1 tablet (20 mg total) by mouth every evening. 90 tablet 3   fluticasone furoate-vilanterol (BREO ELLIPTA) 200-25 MCG/INH AEPB Inhale 1 puff into the lungs daily. 60 each 3   Galcanezumab-gnlm 120 MG/ML SOAJ Inject 120 mg into the skin every 28 (twenty-eight) days.      heparin lock flush 100 UNIT/ML SOLN injection USE 5 MLS AS A HEPLOCK IN PORT A CATH ONCE DAILY AFTER MEDICATION ADMINISTRATION OR AS DIRECTED BY PHYSICIAN. 150 mL 1   hydrocortisone (CORTEF) 10 MG tablet Take 1-2 tablets (10-20 mg total) by mouth See admin instructions. Take 20 mg in the am and 90m in the evening (Patient taking differently: Take 10-20 mg by mouth See admin instructions. Take 20 mg by mouth in the morning and 128min the evening)     levothyroxine (SYNTHROID, LEVOTHROID) 112 MCG tablet Take 112 mcg by mouth daily before breakfast.     LORazepam (ATIVAN) 0.5 MG tablet TAKE 1 TO 2 TABLETS BY MOUTH TWICE A DAY AS NEEDED FOR NAUSEA 30 tablet 1   Mepolizumab (NUCALA) 100 MG SOLR Inject 100 mg into the skin every 28 (twenty-eight) days. 1 each 3   metoprolol succinate (TOPROL-XL) 25 MG 24 hr tablet Take 37.5 mg by mouth daily.      OXYGEN Inhale 3-4 L into the lungs continuous.     potassium chloride (KLOR-CON) 10 MEQ tablet Take 1 tablet (10 mEq total) by mouth daily for 21 days. 21 tablet 0   potassium chloride  (KLOR-CON) 10 MEQ tablet Take 10 mEq by mouth daily as needed (low potassium).     predniSONE (DELTASONE) 10 MG tablet Take 4 tabs x 2 days, then 3 x 2d, then 2 x 2d, then 1 x 2d and STOP 20 tablet 0   Rimegepant Sulfate (NURTEC) 75 MG TBDP Take 75 mg by mouth daily as needed (Migraine).      rosuvastatin (CRESTOR) 10 MG tablet Take 10 mg by mouth daily.      silver sulfADIAZINE (SILVADENE) 1 % cream Apply 1 application topically daily as needed (Open Wound).      sodium chloride 0.9 % infusion Use as directed. 300 mL PRN   sodium chloride flush (BD POSIFLUSH) 0.9 % SOLN injection FLUSH PORT A CATH WITH 10 ML BEFORE AND AFTER FLUIDS TO MAINTAIN PATENCY 1200 mL 1   Sodium Chloride Flush (NORMAL SALINE FLUSH) 0.9 % SOLN USE TWO 10 ML FLUSHES BEFORE AND AFTER FLUIDS AND MEDICATION ADMINISTRATION, TO MAINTAIN PATENCY--USE UP TO 4 TIMES DAILY. 2400 mL 1   sodium chloride flush 0.9 % SOLN injection FLUSH PORT A CATH WITH 10 ML BEFORE AND AFTER FLUIDS TO MAINTAIN PATENCY 10 mL 1   sodium chloride flush 0.9 %  SOLN injection FLUSH PORT A CATH WITH 10 ML SODIUM CHLORIDE BEFORE AND AFTER FLUIDS TO MAINTAIN PATENCY 1200 mL 1   sodium chloride flush 0.9 % SOLN injection USE AS DIRECTED TO FLUSH DAILY AFTER MEDICATION INFUSION 300 mL 1   sucralfate (CARAFATE) 1 g tablet Take 1 g by mouth daily as needed (FOR ULCERS).      topiramate (TOPAMAX) 50 MG tablet Take 150 mg by mouth at bedtime.      zolpidem (AMBIEN CR) 12.5 MG CR tablet Take 12.5 mg by mouth at bedtime.     Current Facility-Administered Medications  Medication Dose Route Frequency Provider Last Rate Last Admin   Mepolizumab SOLR 100 mg  100 mg Subcutaneous Q28 days Margaretha Seeds, MD   100 mg at 08/31/19 1017    Medication Side Effects: none  Orders placed this visit:  No orders of the defined types were placed in this encounter.   Psychiatric Specialty Exam:  Review of Systems  Blood pressure 125/73, pulse 93, height 5' 6" (1.676 m),  weight 193 lb (87.5 kg).Body mass index is 31.15 kg/m.  General Appearance: Casual and Neat  Eye Contact:  Good  Speech:  Clear and Coherent and Normal Rate  Volume:  Normal  Mood:  Anxious, Depressed, and Irritable  Affect:  Congruent, Flat, and Tearful  Thought Process:  Coherent and Descriptions of Associations: Intact  Orientation:  Full (Time, Place, and Person)  Thought Content: Logical   Suicidal Thoughts:  No  Homicidal Thoughts:  No  Memory:  WNL  Judgement:  Good  Insight:  Good  Psychomotor Activity:  Normal  Concentration:  Concentration: Good  Recall:  Good  Fund of Knowledge: Good  Language: Good  Assets:  Communication Skills Desire for Improvement Financial Resources/Insurance Housing Intimacy Leisure Time Physical Health Resilience Social Support Talents/Skills Transportation Vocational/Educational  ADL's:  Intact  Cognition: WNL  Prognosis:  Good   Screenings:  Flowsheet [facility] ED to Hosp-Admission (Discharged) from 12/08/2020 in [facility] ([facility]charged) from 12/05/2020 in [facility] ED from 12/01/2020 in [facility] DEPT  C-SSRS RISK CATEGORY No Risk No Risk No Risk       Receiving Psychotherapy: Yes   Treatment Plan/Recommendations:  Plan:  PDMP reviewed  Lexapro 77m daily Xanax 0.516mBID Add Abilify 62m34maily  Set up with therapist   Time spent with patient was 60 minutes. Greater than 50% of face to face time with patient was spent on counseling and coordination of care.  RTC 4 weeks  Patient advised to contact office with any questions, adverse effects, or acute worsening in signs and symptoms.      RegAloha GellP

## 2021-05-08 NOTE — Progress Notes (Signed)
  Echocardiogram 2D Echocardiogram has been performed.  [name] 05/08/2021, 2:14 PM

## 2021-05-12 ENCOUNTER — Other Ambulatory Visit: Payer: Self-pay | Admitting: Hematology and Oncology

## 2021-05-12 DIAGNOSIS — R110 Nausea: Secondary | ICD-10-CM

## 2021-05-15 ENCOUNTER — Telehealth: Payer: Self-pay | Admitting: Adult Health

## 2021-05-15 NOTE — Telephone Encounter (Signed)
Please review

## 2021-05-15 NOTE — Telephone Encounter (Signed)
[name] called and was put on Abilify. She said she stopped taking the Abilify on 05/12/21. She had increased it from 1 mg to 2 mg. She was sleeping all day and on the weekend her husband said she didn't recognize him or their son. When she woke up she had a bad headache. Her husband claims she kept saying she wanted to go home with her mom and dad and she was already home. She would like a phone call back at [phone].

## 2021-05-16 ENCOUNTER — Ambulatory Visit: Payer: BC Managed Care – PPO | Attending: Hematology and Oncology

## 2021-05-16 NOTE — Telephone Encounter (Signed)
Pt has stopped medication Abilify. Please advise what she needs to do.

## 2021-05-16 NOTE — Telephone Encounter (Signed)
Pt took 1/2 tab of Abilify for 3 days and she was extremely drowsy.Then she decided to take a whole pill and slept for 4 hours.When she woke up she had no memory of where she was or who her family was.It took her hours to come back to her senses.She stopped Abilify and wants to discuss an alternative.I will have admins add her to cancellation list

## 2021-05-16 NOTE — Telephone Encounter (Signed)
Does she want to try another medication? Can we get more specifics?

## 2021-05-17 NOTE — Telephone Encounter (Signed)
Noted.

## 2021-05-18 ENCOUNTER — Other Ambulatory Visit (HOSPITAL_COMMUNITY): Payer: Self-pay

## 2021-05-18 ENCOUNTER — Encounter: Payer: Self-pay | Admitting: Pulmonary Disease

## 2021-05-18 ENCOUNTER — Telehealth: Payer: Self-pay | Admitting: Pharmacist

## 2021-05-18 NOTE — Progress Notes (Signed)
Nucala BIV started  Knox Saliva, PharmD, MPH, BCPS Clinical Pharmacist (Rheumatology and Pulmonology)

## 2021-05-18 NOTE — Telephone Encounter (Signed)
Submitted a Prior Authorization request to [facility] for [place] via CoverMyMeds. Will update once we receive a response.   (Key: [id number]) - [id number]

## 2021-05-18 NOTE — Telephone Encounter (Signed)
Please start Nucala BIV.  Dose: 139m every 28 days  Dx: Severe persistent asthma (J45.50, Z79.52)  Previously tried therapies: Nucala (d/c due to loss to f/u)  Current therapy: Breo 200/25 once daily+ Qvar 820m twice daily  Patient has commercial plan and is eligible for savings card   DeKnox SalivaPharmD, MPH, BCPS Clinical Pharmacist (Rheumatology and Pulmonology)

## 2021-05-19 NOTE — Telephone Encounter (Signed)
Received notification from [facility] regarding a prior authorization for [place]. Authorization has been APPROVED from 05/18/21 to 11/18/21.   Patient must fill through [facility]: [phone]  Authorization # [id number]  [place] card:  Member ID: [id number]  Confirmation number: [id number]  Group Number: [id number]  Rx Bin: [id number]  PCN: [id number]  [place] debit card:  Member ID: [id number]  16 digit card number:  [phone] CVC # [id number]  [id number] Date: 2025-06-30

## 2021-05-19 NOTE — Telephone Encounter (Signed)
Patient scheduled for Nucala restart visit on 05/23/21. She will bring Epipen and be trained to self-administer.  Will monitor for 1 hour after injection. She has history of anaphylaxis and hives   Knox Saliva, PharmD, MPH, BCPS Clinical Pharmacist (Rheumatology and Pulmonology)

## 2021-05-23 ENCOUNTER — Other Ambulatory Visit: Payer: BC Managed Care – PPO | Admitting: Pharmacist

## 2021-05-23 NOTE — Telephone Encounter (Signed)
Patient states that she depends on others for transportation and her friend wasn't feeling well. She requested to r/s appt to 05/24/21

## 2021-05-24 ENCOUNTER — Encounter (HOSPITAL_COMMUNITY): Payer: Self-pay | Admitting: *Deleted

## 2021-05-24 ENCOUNTER — Other Ambulatory Visit: Payer: BC Managed Care – PPO | Admitting: Pharmacist

## 2021-05-24 NOTE — Progress Notes (Signed)
Received referral from Dr. Loanne Drilling for this pt to participate in pulmonary rehab with the the diagnosis of Severe Asthma. Clinical review of pt follow up appt on 7/18  Pulmonary office note.  Also reviewed additional follow up and ER Visits. Pt with Covid Risk Score - 3. Pt appropriate for scheduling for Pulmonary rehab.  Will forward to support staff for scheduling and verification of insurance eligibility/benefits with pt consent. Cherre Huger, BSN Cardiac and Training and development officer

## 2021-05-25 ENCOUNTER — Other Ambulatory Visit: Payer: Self-pay | Admitting: Hematology and Oncology

## 2021-05-25 ENCOUNTER — Telehealth: Payer: Self-pay | Admitting: *Deleted

## 2021-05-25 MED ORDER — CYCLOBENZAPRINE HCL 5 MG PO TABS: 5.0000 mg | ORAL_TABLET | Freq: Three times a day (TID) | ORAL | 0 refills | Status: DC | PRN

## 2021-05-25 NOTE — Telephone Encounter (Signed)
[name] states she slipped in the bathroom and pulled a muscle in her lower back. Is having lower back spasms. Has been using biofreeze ointment, heating pad and ibuprofen.   Wants to know if Dr Lindi Adie has any suggestions for relief. Possibly a muscle relaxant?

## 2021-05-25 NOTE — Progress Notes (Signed)
Patient fell in the bathroom and pulled a muscle in the lower back causing back spasms.  I sent a prescription for muscle relaxant Flexeril 5 mg as needed.

## 2021-05-25 NOTE — Telephone Encounter (Signed)
Notified that Dr Lindi Adie has prescribed Flexeril 5 mg

## 2021-05-28 DIAGNOSIS — R911 Solitary pulmonary nodule: Secondary | ICD-10-CM | POA: Diagnosis not present

## 2021-05-29 ENCOUNTER — Telehealth: Payer: Self-pay | Admitting: Adult Health

## 2021-05-29 ENCOUNTER — Other Ambulatory Visit: Payer: Self-pay

## 2021-05-29 NOTE — Telephone Encounter (Signed)
It's unlikely that pt's alleged reaction of excessive drowsiness and confusion from Abilify was truly and solely a reaction to the extremely low dose of Abilify.  More likely that something else is going on.  ? Alcohol or drug abuse?  This is a new patient and generally given the unusual SE complaints it is probably best to get her back in to an appt before making further med changes if possible.  Pt's with unusual SE are best not treated over the phone if possible.

## 2021-05-29 NOTE — Progress Notes (Signed)
Abilify discontinued due to adverse reaction.

## 2021-05-29 NOTE — Telephone Encounter (Signed)
See previous phone message about the adverse reaction from taking Abilify. Pt reports she has been on the Lexapro 20 mg for 4+ years and feels it's not effective anymore. Very tearful and sad on the phone, we had an extended phone call and she went over all her medical issues and family dynamics. It's very overwhelming and her depression has just exacerbated. She reports she has gained so much weight with taking steroids on a regular basis, therefore she does not feel good about herself. Asking if she could retry Wellbutrin XL, she did take previously and her husband reports she was mean. She would like to try it if it would not interfere with all her chemo and medical medications.   Informed her I would reach out to [facility]) and/or Dr. Clovis Pu and get their recommendations and give her a call back in the morning. She was so very appreciative of our talk. She does have a counselor that she talks to 3 days a week.

## 2021-05-29 NOTE — Telephone Encounter (Signed)
Pt called and said that the lexapro she has been on is not working. She is very depressed. She was crying and said that her friend just killed herself. She needs help. She said no one has called her back and she has been waiting for a week. Please call her back ASAP at [phone]

## 2021-05-30 ENCOUNTER — Other Ambulatory Visit: Payer: Self-pay | Admitting: Adult Health

## 2021-05-30 ENCOUNTER — Telehealth: Payer: Self-pay | Admitting: Adult Health

## 2021-05-30 DIAGNOSIS — F331 Major depressive disorder, recurrent, moderate: Secondary | ICD-10-CM

## 2021-05-30 MED ORDER — BUPROPION HCL ER (XL) 150 MG PO TB24: 150.0000 mg | ORAL_TABLET | Freq: Every day | ORAL | 2 refills | Status: DC

## 2021-05-30 NOTE — Telephone Encounter (Signed)
Called and spoke with patient. Would like to add Wellbutrin for mood symptoms. Has taken Wellbutrin in the past along with Adderall and it made her "mean". Feels like the combination of the two was not a good fit. 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 Denies any other seizures. Never placed on anti-seizure medications. Has taken Wellbutrin previously without any issues. Will continue the Lexapro at 62m for now.

## 2021-05-30 NOTE — Telephone Encounter (Signed)
Noted. Lets get her scheduled for an appointment.

## 2021-05-30 NOTE — Telephone Encounter (Signed)
She's scheduled the 7 th but that's a week. She's very tearful and upset. It didn't help she has a close friend that just committed suicide, not sure you saw that in any of her messages. She is aware to go to ER with any suicidal ideations. She's had A LOT of things happen. Very sweet lady.

## 2021-05-30 NOTE — Telephone Encounter (Signed)
Called and spoke with patient. Would like to restart the Wellbutrin. Reports one seizure associated with a brain tumor in 2009. Denies seizure history since that time. Has also taken since the surgery without any issues.

## 2021-05-30 NOTE — Telephone Encounter (Signed)
Reviewed thank you

## 2021-06-01 ENCOUNTER — Other Ambulatory Visit (HOSPITAL_COMMUNITY): Payer: Self-pay

## 2021-06-03 ENCOUNTER — Other Ambulatory Visit (HOSPITAL_COMMUNITY): Payer: Self-pay

## 2021-06-06 ENCOUNTER — Ambulatory Visit: Payer: Medicare Other | Admitting: Adult Health

## 2021-06-07 ENCOUNTER — Encounter: Payer: Self-pay | Admitting: Adult Health

## 2021-06-07 ENCOUNTER — Other Ambulatory Visit: Payer: Self-pay

## 2021-06-07 ENCOUNTER — Telehealth (INDEPENDENT_AMBULATORY_CARE_PROVIDER_SITE_OTHER): Payer: BC Managed Care – PPO | Admitting: Adult Health

## 2021-06-07 DIAGNOSIS — F411 Generalized anxiety disorder: Secondary | ICD-10-CM

## 2021-06-07 DIAGNOSIS — F410 Panic disorder [episodic paroxysmal anxiety] without agoraphobia: Secondary | ICD-10-CM | POA: Diagnosis not present

## 2021-06-07 DIAGNOSIS — G4700 Insomnia, unspecified: Secondary | ICD-10-CM

## 2021-06-07 DIAGNOSIS — F331 Major depressive disorder, recurrent, moderate: Secondary | ICD-10-CM | POA: Diagnosis not present

## 2021-06-07 NOTE — Progress Notes (Signed)
[name] [id number] 11/05/1970 50 y.o.  Virtual Visit via Video Note  I connected with pt @ on 06/07/21 at 11:20 AM EDT by a video enabled telemedicine application and verified that I am speaking with the correct person using two identifiers.   I discussed the limitations of evaluation and management by telemedicine and the availability of in person appointments. The patient expressed understanding and agreed to proceed.  I discussed the assessment and treatment plan with the patient. The patient was provided an opportunity to ask questions and all were answered. The patient agreed with the plan and demonstrated an understanding of the instructions.   The patient was advised to call back or seek an in-person evaluation if the symptoms worsen or if the condition fails to improve as anticipated.  I provided 25 minutes of non-face-to-face time during this encounter.  The patient was located at home.  The provider was located at [facility].    N , NP   Subjective:   Patient ID:  [name] is a 50 y.o. (DOB 01/13/1971) female.  Chief Complaint: No chief complaint on file.   HPI [name] presents for follow-up of MDD, GAD, insomnia, and panic attacks.  Describes mood today as "better". Pleasant. Tearful at times. Mood symptoms - reports decreased depression, anxiety, and irritability. Reports decreased panic attacks - one since last visit. Stating "I'm not feeling the ups and downs". Feels like the Lexapro and Wellbutrin have been helpful. Both husband and son have noted an improvement. Stating "I'm not having as many waves". Leaving the house more. Picking up some school everyday. Has gotten in the pool. Plans to go to a [employer] game this weekend. Has not seen daughter in a year or met her granddaughter. Varying interest and motivation. Taking medications as prescribed. 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 Also diagnosed  with multiple  medical issues. Wanting to establish a therapist.  Energy levels low. Active, does not have a regular exercise routine with current physical disabilities. Enjoys some usual interests and activities. Married. Lives with husband. Has a 25 year old daughter and a 15 year old son. Spending time with family.  Appetite decreased - aspirates. Drinking a lot of smoothies and protein drinks. Upcoming swallow evaluation at [facility]. Weight gain. Sleeps well most nights. Averages 8 hours with Ambien. Reports some daytime napping. Focus and concentration stable. Completing tasks. Managing aspects of household. Disabled in 2016 - [employer]. Denies SI or HI.  Denies AH or VH.  Previous medication trials: Wellbutrin - mean, Zoloft-not helpful   Review of Systems:  Review of Systems  Musculoskeletal:  Negative for gait problem.  Neurological:  Negative for tremors.  Psychiatric/Behavioral:         Please refer to HPI   Medications: I have reviewed the patient's current medications.  Current Outpatient Medications  Medication Sig Dispense Refill   albuterol (2.5 MG/3ML) 0.083% NEBU 3 mL, albuterol (5 MG/ML) 0.5% NEBU 0.5 mL Inhale 3 mg into the lungs every 6 (six) hours as needed (SOB).      albuterol (PROVENTIL) (2.5 MG/3ML) 0.083% nebulizer solution Take 3 mLs (2.5 mg total) by nebulization every 6 (six) hours as needed for wheezing or shortness of breath. 75 mL 12   albuterol (VENTOLIN HFA) 108 (90 Base) MCG/ACT inhaler Inhale 2 puffs into the lungs every 6 (six) hours as needed for wheezing or shortness of breath.     ALPRAZolam (XANAX) 0.5 MG tablet Take   0.5 mg by mouth 2 (two) times daily as needed for anxiety.     alum & mag hydroxide-simeth (MAALOX/MYLANTA) 200-200-20 MG/5ML suspension Take 30 mLs by mouth daily as needed for indigestion or heartburn. For Bleeding Ulcers     aspirin 325 MG tablet Take 325 mg by mouth at bedtime.      beclomethasone (QVAR) 80 MCG/ACT inhaler Inhale 1 puff into  the lungs 2 (two) times daily.      beclomethasone (QVAR) 80 MCG/ACT inhaler Inhale 1 puff into the lungs 2 (two) times daily. 1 each 6   benzonatate (TESSALON) 200 MG capsule Take 1 capsule (200 mg total) by mouth 3 (three) times daily as needed for cough. 45 capsule 1   bumetanide (BUMEX) 1 MG tablet TAKE 1 TABLET BY MOUTH TWICE A DAY 180 tablet 1   buPROPion (WELLBUTRIN XL) 150 MG 24 hr tablet Take 1 tablet (150 mg total) by mouth daily. 30 tablet 2   colchicine 0.6 MG tablet Take 0.6 mg by mouth daily in the afternoon.      cyclobenzaprine (FLEXERIL) 5 MG tablet Take 1 tablet (5 mg total) by mouth 3 (three) times daily as needed for muscle spasms. 30 tablet 0   dexlansoprazole (DEXILANT) 60 MG capsule Take 60 mg by mouth daily.     diphenhydrAMINE (BENADRYL) 50 MG/ML injection Inject 1 mL (50 mg total) into the vein every 6 (six) hours as needed (nausea). 25 mL 0   doxycycline (VIBRA-TABS) 100 MG tablet Take 1 tablet (100 mg total) by mouth 2 (two) times daily. 14 tablet 0   dronabinol (MARINOL) 2.5 MG capsule TAKE 1 CAPSULE TWICE DAILYAS NEEDED 30 capsule 0   EPINEPHRINE 0.3 mg/0.3 mL IJ SOAJ injection INJECT 0.3 MLS (0.3 MG TOTAL) INTO THE MUSCLE AS NEEDED FOR ANAPHYLAXIS. (Patient taking differently: Inject 0.3 mg into the muscle daily as needed for anaphylaxis.) 2 each 3   escitalopram (LEXAPRO) 20 MG tablet Take 1 tablet (20 mg total) by mouth every evening. 90 tablet 3   fluticasone furoate-vilanterol (BREO ELLIPTA) 200-25 MCG/INH AEPB Inhale 1 puff into the lungs daily. 60 each 3   Galcanezumab-gnlm 120 MG/ML SOAJ Inject 120 mg into the skin every 28 (twenty-eight) days.      heparin lock flush 100 UNIT/ML SOLN injection USE 5 MLS AS A HEPLOCK IN PORT A CATH ONCE DAILY AFTER MEDICATION ADMINISTRATION OR AS DIRECTED BY PHYSICIAN. 150 mL 1   hydrocortisone (CORTEF) 10 MG tablet Take 1-2 tablets (10-20 mg total) by mouth See admin instructions. Take 20 mg in the am and 10mg in the evening  (Patient taking differently: Take 10-20 mg by mouth See admin instructions. Take 20 mg by mouth in the morning and 10mg in the evening)     levothyroxine (SYNTHROID, LEVOTHROID) 112 MCG tablet Take 112 mcg by mouth daily before breakfast.     LORazepam (ATIVAN) 0.5 MG tablet TAKE 1 TO 2 TABLETS BY MOUTH TWICE A DAY AS NEEDED FOR NAUSEA 30 tablet 1   metoprolol succinate (TOPROL-XL) 25 MG 24 hr tablet Take 37.5 mg by mouth daily.      OXYGEN Inhale 3-4 L into the lungs continuous.     potassium chloride (KLOR-CON) 10 MEQ tablet Take 1 tablet (10 mEq total) by mouth daily for 21 days. 21 tablet 0   potassium chloride (KLOR-CON) 10 MEQ tablet Take 10 mEq by mouth daily as needed (low potassium).     predniSONE (DELTASONE) 10 MG tablet Take 4 tabs x   2 days, then 3 x 2d, then 2 x 2d, then 1 x 2d and STOP 20 tablet 0   Rimegepant Sulfate (NURTEC) 75 MG TBDP Take 75 mg by mouth daily as needed (Migraine).      rosuvastatin (CRESTOR) 10 MG tablet Take 10 mg by mouth daily.      silver sulfADIAZINE (SILVADENE) 1 % cream Apply 1 application topically daily as needed (Open Wound).      sodium chloride 0.9 % infusion Use as directed. 300 mL PRN   sodium chloride flush (BD POSIFLUSH) 0.9 % SOLN injection FLUSH PORT A CATH WITH 10 ML BEFORE AND AFTER FLUIDS TO MAINTAIN PATENCY 1200 mL 1   Sodium Chloride Flush (NORMAL SALINE FLUSH) 0.9 % SOLN USE TWO 10 ML FLUSHES BEFORE AND AFTER FLUIDS AND MEDICATION ADMINISTRATION, TO MAINTAIN PATENCY--USE UP TO 4 TIMES DAILY. 2400 mL 1   sodium chloride flush 0.9 % SOLN injection FLUSH PORT A CATH WITH 10 ML BEFORE AND AFTER FLUIDS TO MAINTAIN PATENCY 10 mL 1   sodium chloride flush 0.9 % SOLN injection FLUSH PORT A CATH WITH 10 ML SODIUM CHLORIDE BEFORE AND AFTER FLUIDS TO MAINTAIN PATENCY 1200 mL 1   sodium chloride flush 0.9 % SOLN injection USE AS DIRECTED TO FLUSH DAILY AFTER MEDICATION INFUSION 300 mL 1   sucralfate (CARAFATE) 1 g tablet Take 1 g by mouth daily as  needed (FOR ULCERS).      topiramate (TOPAMAX) 50 MG tablet Take 150 mg by mouth at bedtime.      zolpidem (AMBIEN CR) 12.5 MG CR tablet Take 12.5 mg by mouth at bedtime.     Current Facility-Administered Medications  Medication Dose Route Frequency Provider Last Rate Last Admin   Mepolizumab SOLR 100 mg  100 mg Subcutaneous Q28 days Margaretha Seeds, MD   100 mg at 08/31/19 1017    Medication Side Effects: None  Allergies:  Allergies  Allergen Reactions   Ferrous Bisglycinate Chelate [Iron] Anaphylaxis    Only IV    Mushroom Extract Complex Anaphylaxis   Na Ferric Gluc Cplx In Sucrose Anaphylaxis   Cymbalta [Duloxetine Hcl] Swelling and Anxiety   Succinylcholine Other (See Comments)    Lock Jaw   Buprenorphine Hcl Hives   Compazine Other (See Comments)    Altered mental status Aggression   Metoclopramide Other (See Comments)    Dystonia   Morphine And Related Hives   Ondansetron Hives and Rash   Promethazine Hcl Hives   Tegaderm Ag Mesh [Silver] Rash    Old formulation only, is able to tolerate new formulation    Past Medical History:  Diagnosis Date   Addison's disease ([place])    Adrenal insufficiency (HCC)    Anemia    Anxiety    Aortic stenosis    Aortic stenosis    Appendicitis 12/19/09   Appendicitis    Breast cancer (HCC)    STATUS POST BILATERAL MASTECTOMY. STATUS POST RECONSTRUCTION. [name] HAD SILICONE BREAST IMPLANTS AND THE LEFT IMPLANT IS LEAKING SLIGHTLY   Cellulitis of right middle finger 11/07/2018   Cervical cancer ([place]) 12/23/2018   Chest pain    CHF with right heart failure ([place]) 04/17/2017   Chronic respiratory failure with hypoxia (HCC) 12/23/2018   Cough variant asthma 04/13/2019   Depression    GERD (gastroesophageal reflux disease)    takes Dexilant and carafate and gi coctail    Headache    migraines on a daily and monthly regimen    Heart murmur  History of kidney stones    Hodgkin lymphoma (HCC)    STATUS POST MANTLE RADIATION   Hodgkin's  lymphoma (HCC)    1987   Hypertension    Hypoxia    Necrotizing fasciitis (HCC) 12/23/2018   Non-ischemic cardiomyopathy (HCC)    Osteoporosis    Palpitations    Pituitary adenoma (HCC) 12/23/2018   Pneumonia    PONV (postoperative nausea and vomiting)    Pre-diabetes    per pt; no meds   Pulmonary hypertension (HCC) 12/23/2018   Raynaud phenomenon    Right heart failure (HCC) 04/17/2017   Seizures (HCC)    last febrile seizure was approx 3 weeks ago per report on 12/01/2020   Sleep apnea    upcoming sleep study per pt    Supplemental oxygen dependent    3 liters   SVT (supraventricular tachycardia) (HCC)    Tachycardia    Thyroid cancer (HCC)    STATUS POST SURGICAL REMOVAL-CURRENT ON THYROID REPLACEMENT    Family History  Family history unknown: Yes    Social History   Socioeconomic History   Marital status: Married    Spouse name: Not on file   Number of children: Not on file   Years of education: Not on file   Highest education level: Not on file  Occupational History   Not on file  Tobacco Use   Smoking status: Never   Smokeless tobacco: Never  Vaping Use   Vaping Use: Never used  Substance and Sexual Activity   Alcohol use: Not Currently    Comment: social    Drug use: No   Sexual activity: Yes    Birth control/protection: Surgical  Other Topics Concern   Not on file  Social History Narrative   Not on file   Social Determinants of Health   Financial Resource Strain: Not on file  Food Insecurity: Not on file  Transportation Needs: Not on file  Physical Activity: Not on file  Stress: Not on file  Social Connections: Not on file  Intimate Partner Violence: Not on file    Past Medical History, Surgical history, Social history, and Family history were reviewed and updated as appropriate.   Please see review of systems for further details on the patient's review from today.   Objective:   Physical Exam:  LMP  (LMP Unknown)   Physical  Exam Constitutional:      General: [name] is not in acute distress. Musculoskeletal:        General: No deformity.  Neurological:     Mental Status: [name] is alert and oriented to person, place, and time.     Coordination: Coordination normal.  Psychiatric:        Attention and Perception: Attention and perception normal. [name] does not perceive auditory or visual hallucinations.        Mood and Affect: Mood normal. Mood is not anxious or depressed. Affect is not labile, blunt, angry or inappropriate.        Speech: Speech normal.        Behavior: Behavior normal.        Thought Content: Thought content normal. Thought content is not paranoid or delusional. Thought content does not include homicidal or suicidal ideation. Thought content does not include homicidal or suicidal plan.        Cognition and Memory: Cognition and memory normal.        Judgment: Judgment normal.     Comments: Insight intact    Lab Review:       Component Value Date/Time   NA 141 04/17/2021 1035   NA 141 04/01/2018 1617   K 3.3 (L) 04/17/2021 1035   CL 103 04/17/2021 1035   CO2 25 04/17/2021 1035   GLUCOSE 144 (H) 04/17/2021 1035   BUN 19 04/17/2021 1035   BUN 11 04/01/2018 1617   CREATININE 1.24 (H) 04/17/2021 1035   CALCIUM 9.7 04/17/2021 1035   PROT 8.0 04/17/2021 1035   PROT 7.1 01/23/2018 1225   ALBUMIN 3.6 04/17/2021 1035   ALBUMIN 4.4 01/23/2018 1225   AST 24 04/17/2021 1035   ALT 40 04/17/2021 1035   ALKPHOS 99 04/17/2021 1035   BILITOT 0.3 04/17/2021 1035   GFRNONAA 53 (L) 04/17/2021 1035   GFRAA >60 04/27/2020 0613   GFRAA >60 02/10/2020 1020       Component Value Date/Time   WBC 9.5 04/17/2021 1035   WBC 8.5 12/09/2020 0530   RBC 4.53 04/17/2021 1035   HGB 11.6 (L) 04/17/2021 1035   HGB 12.0 12/21/2019 1418   HCT 36.5 04/17/2021 1035   HCT 39.9 04/01/2018 1617   PLT 395 04/17/2021 1035   PLT 327 04/01/2018 1617   MCV 80.6 04/17/2021 1035   MCV 89 04/01/2018 1617   MCH 25.6 (L)  04/17/2021 1035   MCHC 31.8 04/17/2021 1035   RDW 16.0 (H) 04/17/2021 1035   RDW 13.6 04/01/2018 1617   LYMPHSABS 2.8 04/17/2021 1035   MONOABS 0.9 04/17/2021 1035   EOSABS 0.2 04/17/2021 1035   BASOSABS 0.1 04/17/2021 1035    No results found for: POCLITH, LITHIUM   No results found for: PHENYTOIN, PHENOBARB, VALPROATE, CBMZ   .res Assessment: Plan:   Plan:  PDMP reviewed  Lexapro 58m daily Xanax 0.549mBID Wellbutrin XL 15058mvery morning - started a week ago  Set up with therapist  RTC 4 weeks  Patient advised to contact office with any questions, adverse effects, or acute worsening in signs and symptoms.   Diagnoses and all orders for this visit:  Major depressive disorder, recurrent episode, moderate (HCC)  Generalized anxiety disorder  Insomnia, unspecified type  Panic attacks    Please see After Visit Summary for patient specific instructions.  Future Appointments  Date Time Provider DepNaytahwaush0/18/2022  3:00 PM [facility] None  08/01/2021 10:20 AM Nahser, PhiWonda ChengD [facility]  10/18/2021 11:15 AM [facility] None  10/18/2021 11:45 AM GudNicholas LoseD [facility] None    No orders of the defined types were placed in this encounter.     -------------------------------

## 2021-06-11 ENCOUNTER — Other Ambulatory Visit: Payer: Self-pay | Admitting: Hematology and Oncology

## 2021-06-12 ENCOUNTER — Encounter: Payer: Self-pay | Admitting: Hematology and Oncology

## 2021-06-12 ENCOUNTER — Other Ambulatory Visit (HOSPITAL_COMMUNITY): Payer: Self-pay

## 2021-06-12 MED ORDER — NORMAL SALINE FLUSH 0.9 % IV SOLN
INTRAVENOUS | 1 refills | Status: DC
  Filled 2021-06-12: qty 1200, 15d supply, fill #0
  Filled 2021-06-26: qty 1200, 15d supply, fill #1
  Filled 2021-07-11: qty 1200, 15d supply, fill #2
  Filled 2021-07-14: qty 2400, 30d supply, fill #2
  Filled 2021-07-14: qty 1200, 15d supply, fill #3

## 2021-06-12 MED ORDER — DRONABINOL 2.5 MG PO CAPS: 2.5000 mg | ORAL_CAPSULE | Freq: Two times a day (BID) | ORAL | 3 refills | Status: DC

## 2021-06-13 ENCOUNTER — Telehealth: Payer: Self-pay | Admitting: Pulmonary Disease

## 2021-06-13 ENCOUNTER — Other Ambulatory Visit (HOSPITAL_COMMUNITY): Payer: Self-pay

## 2021-06-13 MED ORDER — EPINEPHRINE 0.3 MG/0.3ML IJ SOAJ: 0.3000 mg | INTRAMUSCULAR | 2 refills | Status: DC | PRN

## 2021-06-13 NOTE — Telephone Encounter (Signed)
Patient r/s for Nucala restart on 06/19/21. Epipen rx sent to pharmacy. She states her Epipen expired last week.  Knox Saliva, PharmD, MPH, BCPS Clinical Pharmacist (Rheumatology and Pulmonology)

## 2021-06-13 NOTE — Telephone Encounter (Signed)
Called and spoke with Patient.  Patient stated she and her husband tested negative today for covid, but son is positive.   Advised Patient of isolation best they can,wear mask, and handwashing. Patient asked how often to test and if anything prophylaxis, or recommendations she can do or take for covid.   Message routed to Dr. Loanne Drilling

## 2021-06-13 NOTE — Telephone Encounter (Signed)
[facility] Telephone Encounter  Called patient. School notified family of COVID-19 exposure. Son positive 9/13 with headache. Patient and her husband are asymptomatic  Discussed CDC guidelines: Recommend droplet and/or N-95 if available. Wear mask five to ten days after exposure Husband and patient needs to re-test COVID-19 on 06/18/21.  Patient expressed understanding and agreed to plan

## 2021-06-13 NOTE — Telephone Encounter (Signed)
Spoke with the pt  She states her son tested pos for covid today  She started having increased cough since 06/12/21  She just took a covid test and is waiting for results- at home  Advised her to call back with results and will go from there

## 2021-06-13 NOTE — Telephone Encounter (Signed)
Her Covid test is negative and her husband is and how soon should she test again [phone]

## 2021-06-13 NOTE — Addendum Note (Signed)
Addended by: Cassandria Anger on: 06/13/2021 01:12 PM   Modules accepted: Orders

## 2021-06-14 ENCOUNTER — Other Ambulatory Visit: Payer: Self-pay | Admitting: *Deleted

## 2021-06-14 ENCOUNTER — Other Ambulatory Visit: Payer: Self-pay | Admitting: Pulmonary Disease

## 2021-06-14 ENCOUNTER — Other Ambulatory Visit (HOSPITAL_COMMUNITY): Payer: Self-pay

## 2021-06-14 ENCOUNTER — Telehealth: Payer: Self-pay | Admitting: Pulmonary Disease

## 2021-06-14 DIAGNOSIS — U071 COVID-19: Secondary | ICD-10-CM

## 2021-06-14 NOTE — Telephone Encounter (Signed)
Message sent to Dr. Loanne Drilling in previous message. I have spoke with Patient.  She is aware Dr. Loanne Drilling was sent message and is in office this afternoon. Advise Patient Dr. Loanne Drilling or triage staff will call with recommendations. Understanding stated.  Message closed.   See other encounter 06/13/21.

## 2021-06-14 NOTE — Telephone Encounter (Signed)
Called and spoke with pt who states that she did take another covid test which this time did come back positive.  Pt said that she has been up since 2am due to having chills. Pt has had a temp of 101.5 and has been taking motrin which is not helping to keep it down.  Pt has also been coughing, has a sore throat, and also has had a headache.  States that her symptoms first began 2 days ago but have just gotten worse.  Pt wants to know what we recommend to help with her symptoms. Dr. Loanne Drilling, please advise.

## 2021-06-14 NOTE — Telephone Encounter (Signed)
[facility] Telephone Encounter  Patient report fever 101, chills, cough, sore throat and headache. Started two days ago and worsened today with fever today.  We discussed available treatments including monoclonal antibody infusion and oral anti-viral agents to treat COVID-19 infection. We discussed risks and benefits of treatments and that the FDA has released this medications under emergency use.  Patient expressed understanding and wishes to pursue monoclonal antibody infusion.  Will place referral and orders.

## 2021-06-14 NOTE — Telephone Encounter (Signed)
Pt states she tested pos for covid and has had the chills, fever, and sore throat since 2am. Please advise.  Pharm [facility] on [address]

## 2021-06-15 ENCOUNTER — Ambulatory Visit (INDEPENDENT_AMBULATORY_CARE_PROVIDER_SITE_OTHER): Payer: BC Managed Care – PPO

## 2021-06-15 ENCOUNTER — Telehealth (HOSPITAL_COMMUNITY): Payer: Self-pay

## 2021-06-15 VITALS — BP 126/71 | HR 96 | Temp 98.4°F | Resp 20

## 2021-06-15 DIAGNOSIS — Z452 Encounter for adjustment and management of vascular access device: Secondary | ICD-10-CM | POA: Diagnosis not present

## 2021-06-15 DIAGNOSIS — U071 COVID-19: Secondary | ICD-10-CM

## 2021-06-15 MED ORDER — HEPARIN SOD (PORK) LOCK FLUSH 100 UNIT/ML IV SOLN
500.0000 [IU] | Freq: Once | INTRAVENOUS | Status: AC
  Administered 2021-06-15: 500 [IU] via INTRAVENOUS

## 2021-06-15 MED ORDER — SODIUM CHLORIDE 0.9 % IV SOLN: INTRAVENOUS | Status: DC | PRN

## 2021-06-15 MED ORDER — EPINEPHRINE 0.3 MG/0.3ML IJ SOAJ
0.3000 mg | Freq: Once | INTRAMUSCULAR | Status: AC | PRN
Start: 2021-06-15 — End: 2021-06-15

## 2021-06-15 MED ORDER — DIPHENHYDRAMINE HCL 50 MG/ML IJ SOLN: 50.0000 mg | Freq: Once | INTRAMUSCULAR | Status: AC | PRN

## 2021-06-15 MED ORDER — METHYLPREDNISOLONE SODIUM SUCC 125 MG IJ SOLR: 125.0000 mg | Freq: Once | INTRAMUSCULAR | Status: AC | PRN

## 2021-06-15 MED ORDER — ALBUTEROL SULFATE HFA 108 (90 BASE) MCG/ACT IN AERS: 2.0000 | INHALATION_SPRAY | Freq: Once | RESPIRATORY_TRACT | Status: AC | PRN

## 2021-06-15 MED ORDER — BEBTELOVIMAB 175 MG/2 ML IV (EUA)
175.0000 mg | Freq: Once | INTRAMUSCULAR | Status: AC
  Administered 2021-06-15: 175 mg via INTRAVENOUS

## 2021-06-15 MED ORDER — FAMOTIDINE IN NACL 20-0.9 MG/50ML-% IV SOLN: 20.0000 mg | Freq: Once | INTRAVENOUS | Status: AC | PRN

## 2021-06-15 NOTE — Telephone Encounter (Signed)
Pt insurance is active and benefits verified through [place] $40, DED 0/0 met, out of pocket $2,500/$2,500 met, co-insurance 0%. no pre-authorization required. [employer]/BCBS 06/15/2021_0 :10pm, REF# [id number]   Will contact patient to see if she is interested in the Cardiac Rehab Program. If interested, patient will need to complete follow up appt. Once completed, patient will be contacted for scheduling upon review by the RN Navigator.

## 2021-06-15 NOTE — Progress Notes (Signed)
Diagnosis: COVID  Provider:  Marshell Garfinkel, MD  Procedure: Infusion  IV Type: Port a Cath, IV Location: L Chest  Bebtelovimab, Dose: 175 mg  Infusion Start Time: 0947  Infusion Stop Time: 0962  Post Infusion IV Care: Observation period completed and Port a Cath Deaccessed/Flushed with Heparin.  Discharge: Condition: Good, Destination: Home . AVS provided to patient.   Performed by:  Arnoldo Morale, RN

## 2021-06-15 NOTE — Patient Instructions (Signed)
Patient was given drug information sheet for Bebtelovimab.  Also given cost estimate sheet for Bebtelovimab.  Patient reviewed documentation and questions were answered.  Patient would like to proceed with treatment at this time.

## 2021-06-15 NOTE — Progress Notes (Deleted)
HPI Patient presents today to [facility] to see pharmacy team for Nucala re-start. She has pulm history significant for severe persistent asthma.  Past medical history includes:Addison's disease, GERD, HTN, migraines, vasculopathy, hyperlipidemia.  She has significant oncologic history of Hodgkin's lymphoma, thyroid cancer, cervical cancer, ductal carcinoma of breast  Epipen on hand and in date: {yes/no:20286}  Number of hospitalizations in past year: Number of ***COPD/asthma exacerbations in past year:   Respiratory Medications Current regimen: Qvar 35mg (1 puff twice daily),  Tried in past: *** Patient reports {Adherence challenges yes [id number]::"FZNRLYGBEchallenges","no known adherence challenges"}  OBJECTIVE Allergies  Allergen Reactions   Ferrous Bisglycinate Chelate [Iron] Anaphylaxis    Only IV    Mushroom Extract Complex Anaphylaxis   Na Ferric Gluc Cplx In Sucrose Anaphylaxis   Cymbalta [Duloxetine Hcl] Swelling and Anxiety   Succinylcholine Other (See Comments)    Lock Jaw   Buprenorphine Hcl Hives   Compazine Other (See Comments)    Altered mental status Aggression   Metoclopramide Other (See Comments)    Dystonia   Morphine And Related Hives   Ondansetron Hives and Rash   Promethazine Hcl Hives   Tegaderm Ag Mesh [Silver] Rash    Old formulation only, is able to tolerate new formulation    Outpatient Encounter Medications as of 06/19/2021  Medication Sig Note   albuterol (2.5 MG/3ML) 0.083% NEBU 3 mL, albuterol (5 MG/ML) 0.5% NEBU 0.5 mL Inhale 3 mg into the lungs every 6 (six) hours as needed (SOB).     albuterol (PROVENTIL) (2.5 MG/3ML) 0.083% nebulizer solution Take 3 mLs (2.5 mg total) by nebulization every 6 (six) hours as needed for wheezing or shortness of breath.    albuterol (VENTOLIN HFA) 108 (90 Base) MCG/ACT inhaler Inhale 2 puffs into the lungs every 6 (six) hours as needed for wheezing or shortness of breath.    ALPRAZolam (XANAX) 0.5  MG tablet Take 0.5 mg by mouth 2 (two) times daily as needed for anxiety.    alum & mag hydroxide-simeth (MAALOX/MYLANTA) 200-200-20 MG/5ML suspension Take 30 mLs by mouth daily as needed for indigestion or heartburn. For Bleeding Ulcers    aspirin 325 MG tablet Take 325 mg by mouth at bedtime.     beclomethasone (QVAR) 80 MCG/ACT inhaler Inhale 1 puff into the lungs 2 (two) times daily.     beclomethasone (QVAR) 80 MCG/ACT inhaler Inhale 1 puff into the lungs 2 (two) times daily.    benzonatate (TESSALON) 200 MG capsule Take 1 capsule (200 mg total) by mouth 3 (three) times daily as needed for cough.    bumetanide (BUMEX) 1 MG tablet TAKE 1 TABLET BY MOUTH TWICE A DAY    buPROPion (WELLBUTRIN XL) 150 MG 24 hr tablet Take 1 tablet (150 mg total) by mouth daily.    colchicine 0.6 MG tablet Take 0.6 mg by mouth daily in the afternoon.     cyclobenzaprine (FLEXERIL) 5 MG tablet Take 1 tablet (5 mg total) by mouth 3 (three) times daily as needed for muscle spasms.    dexlansoprazole (DEXILANT) 60 MG capsule Take 60 mg by mouth daily.    diphenhydrAMINE (BENADRYL) 50 MG/ML injection Inject 1 mL (50 mg total) into the vein every 6 (six) hours as needed (nausea).    doxycycline (VIBRA-TABS) 100 MG tablet Take 1 tablet (100 mg total) by mouth 2 (two) times daily.    dronabinol (MARINOL) 2.5 MG capsule Take 1 capsule (2.5 mg total) by mouth 2 (two) times daily  before lunch and supper.    EPINEPHrine 0.3 mg/0.3 mL IJ SOAJ injection Inject 0.3 mg into the muscle as needed for anaphylaxis.    escitalopram (LEXAPRO) 20 MG tablet Take 1 tablet (20 mg total) by mouth every evening.    fluticasone furoate-vilanterol (BREO ELLIPTA) 200-25 MCG/INH AEPB Inhale 1 puff into the lungs daily.    Galcanezumab-gnlm 120 MG/ML SOAJ Inject 120 mg into the skin every 28 (twenty-eight) days.     heparin lock flush 100 UNIT/ML SOLN injection USE 5 MLS AS A HEPLOCK IN PORT A CATH ONCE DAILY AFTER MEDICATION ADMINISTRATION OR AS  DIRECTED BY PHYSICIAN.    hydrocortisone (CORTEF) 10 MG tablet Take 1-2 tablets (10-20 mg total) by mouth See admin instructions. Take 20 mg in the am and 45m in the evening (Patient taking differently: Take 10-20 mg by mouth See admin instructions. Take 20 mg by mouth in the morning and 15min the evening)    levothyroxine (SYNTHROID, LEVOTHROID) 112 MCG tablet Take 112 mcg by mouth daily before breakfast. 06/29/2019: Brand name only   LORazepam (ATIVAN) 0.5 MG tablet TAKE 1 TO 2 TABLETS BY MOUTH TWICE A DAY AS NEEDED FOR NAUSEA    metoprolol succinate (TOPROL-XL) 25 MG 24 hr tablet Take 37.5 mg by mouth daily.     OXYGEN Inhale 3-4 L into the lungs continuous.    potassium chloride (KLOR-CON) 10 MEQ tablet Take 1 tablet (10 mEq total) by mouth daily for 21 days.    potassium chloride (KLOR-CON) 10 MEQ tablet Take 10 mEq by mouth daily as needed (low potassium).    predniSONE (DELTASONE) 10 MG tablet Take 4 tabs x 2 days, then 3 x 2d, then 2 x 2d, then 1 x 2d and STOP    Rimegepant Sulfate (NURTEC) 75 MG TBDP Take 75 mg by mouth daily as needed (Migraine).     rosuvastatin (CRESTOR) 10 MG tablet Take 10 mg by mouth daily.     silver sulfADIAZINE (SILVADENE) 1 % cream Apply 1 application topically daily as needed (Open Wound).     sodium chloride 0.9 % infusion Use as directed.    sodium chloride flush (BD POSIFLUSH) 0.9 % SOLN injection FLUSH PORT A CATH WITH 10 ML BEFORE AND AFTER FLUIDS TO MAINTAIN PATENCY    Sodium Chloride Flush (NORMAL SALINE FLUSH) 0.9 % SOLN USE TWO 10 ML FLUSHES BEFORE AND AFTER FLUIDS AND MEDICATION ADMINISTRATION, TO MAINTAIN PATENCY--USE UP TO 4 TIMES DAILY.    sodium chloride flush 0.9 % SOLN injection FLUSH PORT A CATH WITH 10 ML BEFORE AND AFTER FLUIDS TO MAINTAIN PATENCY    sodium chloride flush 0.9 % SOLN injection FLUSH PORT A CATH WITH 10 ML SODIUM CHLORIDE BEFORE AND AFTER FLUIDS TO MAINTAIN PATENCY    sodium chloride flush 0.9 % SOLN injection USE AS DIRECTED  TO FLUSH DAILY AFTER MEDICATION INFUSION    sucralfate (CARAFATE) 1 g tablet Take 1 g by mouth daily as needed (FOR ULCERS).     topiramate (TOPAMAX) 50 MG tablet Take 150 mg by mouth at bedtime.     zolpidem (AMBIEN CR) 12.5 MG CR tablet Take 12.5 mg by mouth at bedtime.    Facility-Administered Encounter Medications as of 06/19/2021  Medication   0.9 %  sodium chloride infusion   albuterol (VENTOLIN HFA) 108 (90 Base) MCG/ACT inhaler 2 puff   diphenhydrAMINE (BENADRYL) injection 50 mg   EPINEPHrine (EPI-PEN) injection 0.3 mg   famotidine (PEPCID) IVPB 20 mg premix  Mepolizumab SOLR 100 mg   methylPREDNISolone sodium succinate (SOLU-MEDROL) 125 mg/2 mL injection 125 mg     Immunization History  Administered Date(s) Administered   Influenza, High Dose Seasonal PF 06/19/2019   Influenza, Quadrivalent, Recombinant, Inj, Pf 08/13/2018   Influenza, Seasonal, Injecte, Preservative Fre 06/23/2016, 11/12/2017, 08/13/2018   Influenza,inj,Quad PF,6+ Mos 07/04/2015, 07/04/2015, 06/23/2016, 07/03/2017   Influenza,inj,Quad PF,6-35 Mos 07/04/2015   Influenza-Unspecified 07/23/2007, 07/06/2008, 06/13/2009, 07/06/2010, 07/25/2011, 08/01/2012, 07/14/2013, 07/04/2015, 07/04/2015, 06/23/2016, 06/23/2016, 07/03/2017, 11/12/2017, 06/19/2019, 06/30/2020   Moderna Sars-Covid-2 Vaccination 12/15/2019, 01/12/2020   Pneumococcal Conjugate-13 03/20/2019   Pneumococcal Polysaccharide-23 02/21/2017   Pneumococcal-Unspecified 02/21/2017   Td 12/19/2010   Varicella 04/02/2007     PFTs PFT Results Latest Ref Rng & Units 04/16/2019  FVC-Pre L 2.34  FVC-Predicted Pre % 63  FVC-Post L 2.42  FVC-Predicted Post % 65  Pre FEV1/FVC % % 76  Post FEV1/FCV % % 84  FEV1-Pre L 1.79  FEV1-Predicted Pre % 60  FEV1-Post L 2.03  DLCO uncorrected ml/min/mmHg 16.53  DLCO UNC% % 75  DLVA Predicted % 96  TLC L 4.40  TLC % Predicted % 84  RV % Predicted % 97     Eosinophils Most recent blood eosinophil count was ***  cells/microL taken on ***.   IgE: *** on ***   Assessment   Biologics training for mepolizumab (Nucala)  Goals of therapy: Mechanism of Action: Not fully understood. It does act an interleukin-5 (IL-5) antagonist monoclonal antibody that reduces the production and survival of eosinophils by blocking the binding of IL-5 to the alpha chain of the receptor complex on the eosinophil cell surface. Reviewed that Nucala is add-on medication and patient must continue maintenance inhaler regimen. Response to therapy: may take 3 months to 6 months to determine efficacy. Discussed that patients generally feel improvement sooner than 3 months.  Side effects: headache (19%), injection site reaction (7-15%), antibody development (6%), backache (5%), fatigue (5%)  Dose: 100 mg subcutaneously every 4 weeks  Administration/Storage:  Reviewed administration sites of thigh or abdomen (at least 2-3 inches away from abdomen). Reviewed the upper arm is only appropriate if caregiver is administering injection  Do not shake the reconstituted solution as this could lead to product foaming or precipitation. Solution should be clear to opalescent and colorless to pale yellow or pale brown, essentially particle free. Small air bubbles, however, are expected and acceptable. If particulate matter remains in the solution or if the solution appears cloudy or milky, discard the solution.  Reviewed storage of medication in refrigerator. Reviewed that Nucala can be stored at room temperature in unopened carton for up to 7 days.  Access: Approval of Nucala through: insurance Patient enrolled into copay card program to help with copay assistance.  Medication Reconciliation  A drug regimen assessment was performed, including review of allergies, interactions, disease-state management, dosing and immunization history. Medications were reviewed with the patient, including name, instructions, indication, goals of therapy,  potential side effects, importance of adherence, and safe use.  Drug interaction(s): ***  Immunizations  Patient is indicated for the influenzae, pneumonia, and shingles vaccinations. Patient has received 2 COVID19 vaccines.     PLAN Continue Nucala 180m every 4 weeks. Next dose is due *** and every 4 weeks thereafter. Rx sent to: [facility]: [phone] Patient provided with pharmacy phone number and advised to call later this week to schedule shipment to home. Patient provided with copay card information to provide to pharmacy if quoted copay exceeds $5 per month.  Continue maintenance asthma regimen of: ***  All questions encouraged and answered.  Instructed patient to reach out with any further questions or concerns.  Thank you for allowing pharmacy to participate in this patient's care.  This appointment required 60 minutes of patient care (this includes precharting, chart review, review of results, face-to-face care, etc.).  Knox Saliva, PharmD, MPH, BCPS Clinical Pharmacist (Rheumatology and Pulmonology)

## 2021-06-16 ENCOUNTER — Encounter: Payer: Self-pay | Admitting: Hematology and Oncology

## 2021-06-16 NOTE — Telephone Encounter (Signed)
Pt insurance is active and benefits verified through [employer] $40, DED 0/0 met, out of pocket $2,500/$2,500 met, co-insurance 0%. no pre-authorization required. DrewG/[employer]BS 06/15/2021_0 :10pm, REF# [id number]  2ndary insurance is active and benefits verified through Medicare a/b. Co-pay 0, DED $233/$233 met, out of pocket 0/0 met, co-insurance 20%. No pre-authorization required.

## 2021-06-19 ENCOUNTER — Telehealth: Payer: Self-pay | Admitting: Pulmonary Disease

## 2021-06-19 ENCOUNTER — Other Ambulatory Visit: Payer: BC Managed Care – PPO | Admitting: Pharmacist

## 2021-06-19 MED ORDER — AZITHROMYCIN 250 MG PO TABS: 250.0000 mg | ORAL_TABLET | Freq: Every day | ORAL | 0 refills | Status: DC

## 2021-06-19 NOTE — Telephone Encounter (Signed)
See mychart visit encounter. Addressed with telephone call.

## 2021-06-19 NOTE — Telephone Encounter (Signed)
Hello Dr. Loanne Drilling, please advise on mychart message below, thanks!  Hope you had a good weekend!    When I came on Thursday for my infusion, I wasn't feeling good at all. However, by Friday midday, I REALLY learned what what feeling your worst was. My husband and I are still struggling terribly. Our son, seems to be on the mend.    I do have a question though. It seems I am on the train headed to addison's crisis mode, and need an IV stress dose of hydrocortisone (cortef). I've tried to stress dose with my oral cortef, but it can't keep up. BP is dropping , pulse high, reflux uncontrollable, dehydrated from the Covid, and now have blurry vision. Don't know what to do. Definitely want to wait on my nucala injection for today. Please advise, I'm sorry.[name]  [phone]

## 2021-06-19 NOTE — Telephone Encounter (Signed)
Patient called stated she is still having issues with covid.  Patient stated she had infusion 06/25/21.  Patient stated she continues to have fatigue and body aches.  Patient stated she can not stop coughing.  Patient stated she has a productive cough, green sputum, and wheezing.  Patient denies fever or chills at this time, but feels she may have a addison crisis.  Patient also sent my chart message -  Hello Dr. Loanne Drilling, please advise on mychart message below, thanks!   Hope you had a good weekend!    When I came on Thursday for my infusion, I wasn't feeling good at all. However, by Friday midday, I REALLY learned what what feeling your worst was. My husband and I are still struggling terribly. Our son, seems to be on the mend.    I do have a question though. It seems I am on the train headed to addison's crisis mode, and need an IV stress dose of hydrocortisone (cortef). I've tried to stress dose with my oral cortef, but it can't keep up. BP is dropping , pulse high, reflux uncontrollable, dehydrated from the Covid, and now have blurry vision. Don't know what to do. Definitely want to wait on my nucala injection for today. Please advise, I'm sorry.[name]  [phone]  Message routed to Dr. Loanne Drilling to advise

## 2021-06-19 NOTE — Telephone Encounter (Signed)
[facility] Telephone Encounter  On telephone interview, patient reports SBP nadir 90s. She has productive cough with green sputum now with wheeze and shortness of breath. She has received COVID antibody infusion on 06/15/21. Oxygen levels will intermittently drop to 88% on her home 3L O2 but she states this is only while coughing. Otherwise O2 levels >90%.  Assessment Asthma exacerbation secondary to COVID-19 Presumed co-comitant bacterial pneumonia --Prednisone taper --Azithro --Advised to seek medical attention if symptoms worsen including: oxygen levels <88% despite O2 therapy, worsening respiratory distress and/or worsening blood pressure despite medical management at home

## 2021-06-21 ENCOUNTER — Other Ambulatory Visit: Payer: Self-pay | Admitting: Adult Health

## 2021-06-21 ENCOUNTER — Encounter: Payer: Self-pay | Admitting: Pulmonary Disease

## 2021-06-21 DIAGNOSIS — F331 Major depressive disorder, recurrent, moderate: Secondary | ICD-10-CM

## 2021-06-22 NOTE — Telephone Encounter (Signed)
90 day ok?

## 2021-06-22 NOTE — Telephone Encounter (Signed)
Yes.

## 2021-06-23 ENCOUNTER — Ambulatory Visit (INDEPENDENT_AMBULATORY_CARE_PROVIDER_SITE_OTHER): Payer: BC Managed Care – PPO | Admitting: Cardiovascular Disease

## 2021-06-23 ENCOUNTER — Other Ambulatory Visit: Payer: Self-pay

## 2021-06-23 ENCOUNTER — Encounter: Payer: Self-pay | Admitting: Cardiovascular Disease

## 2021-06-23 VITALS — BP 114/64 | HR 97 | Ht 66.0 in | Wt 194.0 lb

## 2021-06-23 DIAGNOSIS — R002 Palpitations: Secondary | ICD-10-CM | POA: Diagnosis not present

## 2021-06-23 DIAGNOSIS — I493 Ventricular premature depolarization: Secondary | ICD-10-CM | POA: Diagnosis not present

## 2021-06-23 DIAGNOSIS — I50810 Right heart failure, unspecified: Secondary | ICD-10-CM | POA: Diagnosis not present

## 2021-06-23 NOTE — Patient Instructions (Signed)
Medication Instructions:  Your physician recommends that you continue on your current medications as directed. Please refer to the Current Medication list given to you today.  *If you need a refill on your cardiac medications before your next appointment, please call your pharmacy*   Lab Work: None Ordered If you have labs (blood work) drawn today and your tests are completely normal, you will receive your results only by: [facility] (if you have MyChart) OR A paper copy in the mail If you have any lab test that is abnormal or we need to change your treatment, we will call you to review the results.   Testing/Procedures: None Ordered    Follow-Up: At [facility], you and your health needs are our priority.  As part of our continuing mission to provide you with exceptional heart care, we have created designated Provider Care Teams.  These Care Teams include your primary Cardiologist (physician) and Advanced Practice Providers (APPs -  Physician Assistants and Nurse Practitioners) who all work together to provide you with the care you need, when you need it.   Your next appointment:   1 year(s)  The format for your next appointment:   In Person  Provider:   You may see Mertie Moores, MD or one of the following Advanced Practice Providers on your designated Care Team:   Richardson Dopp, PA-C [place], [place]

## 2021-06-23 NOTE — Progress Notes (Signed)
Cardiology Office Note   Date:  06/23/2021   ID:  [name], DOB 14-Nov-1970, MRN [id number]  PCP:  Nicholas Lose, MD  Cardiologist:   Mertie Moores, MD   Chief Complaint  Patient presents with   Hypertension   Palpitations   Problem List:  1. Palpitations/premature ventricular contractions 2.  history of Hodgkin's lymphoma-status post mantle radiation 3. History of breast cancer-status post Reconstruction 4. History of cervical cancer 5. 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 2015 for regrowth of tumor.  6. History of thyroidectomy - hx of thyroid cancer  7. Raynaud's phenomenon 8. Appendectomy 9. History chest pains-normal heart catheterization, normal TEE 10.   history of adrenal insufficiency  11. Hyperlipidemia   Previous notes.[name] is done very well since I last saw her about a year ago. She has had some occasional palpitations that are typically controlled with metoprolol. She's not had any episodes of chest pain. She's been able to stop all of her steroids. Her adrenal glands have gradually improved and her now producing cortisol.  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  She is still working at [employer].   Feb 24, 2013:  She has done well since I last saw her .  She has had her breast implants replaced ( old ones were leaking).  Her costochondritis has resolved.  She has not been lifting.    She has run in 6 5K races this year.  She is playing tennis.    She is still in [place] school.  She is doing her orthopedic rotation.    Nov. 11, 2014:  Her HR has been high.  She has a head ache frequently.  She started Adderall recently.   The adderall has help with her focus but she feels much worse in it.   She has been on the Adderall for 2 months and the tachycardia started about 1 week ago.  She was doing cycle classes 3 times a  week.  She is exercising regularly and for the past week, her HR is extremely fast.   She has gone into menapause and has lots of hot flashes.   Feb. 23, 2015:  She has been having some recent episodes of tachycardia. We increased her metoprolol at the last visit. We performed an echocardiogram which revealed   Left ventricle: The cavity size was normal. Systolic function was normal. The estimated ejection fraction was in the range of 55% to 60%. Wall motion was normal; there were no regional wall motion abnormalities. Left ventricular diastolic function parameters were normal. - Aortic valve: Trivial regurgitation. - Mitral valve: Trivial regurgitation  She tried to stop her Adderall ( did not do well with that).   She decreased her dose slightly and she is not having much tachycardia. She has been   She has been found to have some osteoperosis and osteopenia.    March 09, 2014:  [name] is doing ok.. No recent cardiac problems.   She has been very active and is feeling great.  Playing tennis on a regular basis.     Feb. 17, 2016:   [name] is a 50 y.o. female who presents for for evaluation of palpitations , hypotension. Echo yesterday  Left ventricle: The cavity size was normal. Systolic function was   normal. The estimated ejection fraction was in the range of 55%  to 60%. Wall motion was normal; there were no regional wall   motion abnormalities. - Aortic valve: There was trivial regurgitation. - Mitral valve: Calcified annulus.  Labs were ordered yesterday but she was not able to get them. Continues to have palpitations today She is off her Estrogen at this point.  She is back on the Toprol - skipped 2 days.  Has been taking the PRN propranolol  Not feeling well and has not been eating or drinking well. We given 1 dose of lasix   December 15, 2014:  She has continued to have palpitations  - last 20-30 seconds .  HR has remained fairly high. Has been working out  regularly .  These are going ok.    Feb 03, 2015: [name] continues to have palpitations . She wore a monitor but actually only wore is for about 1/2 of the time ( she was at [employer]) We have discussed implantable loop.  I have some concerns about doing any procedures on her .  She has only been taking the toprol 50 mg a day instead of 75.   May 17, 2015:  [name] is seen today for follow up. She has a history of hyperlipidemia and we sent her to lipid clinic. She is tolerating the Crestor very well.   Still having palpitations but learning how to manage .  Oct. 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 Has been seen at [facility] and is now at [facility]  She has sent scans to [facility]. Is on steroids to shrink tumor, has gained lots of weight.  Is hoping to be able to get a bx so that she have this tumor treated.   She is now on home O2.  O2 sats dropped to 78 while sleeping on room air.    O2 sats on 2.5 liters are good. She uses 4 lpm at night .   Is having more palpitations. Would like a refill for propranolol'   April 18, 2017:  [name] is seen today  Is still on home O2.,  Has chronic respiratory failure ( unclear etiology at this point )   Has RV failure.   Had a right heart cath.  PA pressures were only minimally elevated.  Had significant leg swelling   Has been on steroids.  Has developed pre-diabetes  Nov. 15,, 2018  In Sept 25,she woke up from a nap Ad tingling and numbness in her legs , had no strength in her legs . 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   2nd MRI of back showed a lesion between T10 -T 11.   Nothing that truly showed compression.  Was transferred to rehab unit in [place].    Has regained some motor ability I her lower abd.   Ruptured  her left breast implant using her arms in rehab  Scheduled to have her implants removed later this month .   Still on home O2,  Palpitations have been well controlled.   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 ( Bronch in Dec. 2018)    Has pulmonary HTN.  Lasix is no working for her at this point  - takes Lasix 20 mg a day  Is using a walker now - was in a wheelchair last time  Had temporary paralysis of her legs.  Improving .  Regaining her strength.    Jan. 27, 2020:  [name] is seen for follow up  Has had pressure ulders in her hands  Is on chronic steroids for her adrenal insuff.  Has had an echocardiogram at [facility] in December.  She has normal left ventricular systolic function with an ejection fraction of 55%.  She has trivial mitral regurgitation and trivial tricuspid regurgitation as well as trivial aortic insufficiency. Was no comment on pulmonary pressures.  Sept. 23, 2022:   [name] is seen  Covid infection 11 days ago ,  hreceived Manufacturing engineer. IV infusion    She developoed a vasculitis and and and her left index finger amputated at the PIP  Is walking better now   Palpitations continue    Past Medical History:  Diagnosis Date   Addison's disease ([place])    Adrenal insufficiency ([place])    Anemia    Anxiety    Aortic stenosis    Aortic stenosis    Appendicitis 12/19/09   Appendicitis    Breast cancer (HCC)    STATUS POST BILATERAL MASTECTOMY. STATUS POST RECONSTRUCTION. SHE HAD SILICONE BREAST IMPLANTS AND THE LEFT IMPLANT IS LEAKING SLIGHTLY   Cellulitis of right middle finger 11/07/2018   Cervical cancer ([place]) 12/23/2018   Chest pain    CHF with right heart failure ([place]) 04/17/2017   Chronic respiratory failure with hypoxia (HCC) 12/23/2018   Cough variant asthma 04/13/2019   Depression    GERD (gastroesophageal reflux disease)    takes Dexilant and carafate and gi coctail    Headache    migraines on a daily and monthly  regimen    Heart murmur    History of kidney stones    Hodgkin lymphoma ([place])    STATUS POST MANTLE RADIATION   Hodgkin's lymphoma ([place])    1987   Hypertension    Hypoxia    Necrotizing fasciitis ([place]) 12/23/2018   Non-ischemic cardiomyopathy ([place])    Osteoporosis    Palpitations    Pituitary adenoma ([place]) 12/23/2018   Pneumonia    PONV (postoperative nausea and vomiting)    Pre-diabetes    per pt; no meds   Pulmonary hypertension ([place]) 12/23/2018   Raynaud phenomenon    Right heart failure ([place]) 04/17/2017   Seizures ([place])    last febrile seizure was approx 3 weeks ago per report on 12/01/2020   Sleep apnea    upcoming sleep study per pt    Supplemental oxygen dependent    3 liters   SVT (supraventricular tachycardia) (HCC)    Tachycardia    Thyroid cancer ([place])    STATUS POST SURGICAL REMOVAL-CURRENT ON THYROID REPLACEMENT    Past Surgical History:  Procedure Laterality Date   ABDOMINAL HYSTERECTOMY     AMPUTATION Left 01/30/2019   Procedure: Left Index finger amputation with flap reconstruction and repair reconstruction;  Surgeon: Roseanne Kaufman, MD;  Location: [facility];  Service: Orthopedics;  Laterality: Left;   APPENDECTOMY     breast implants and removal      breast implants but leaking      CARDIAC CATHETERIZATION  05/18/09   NORMAL CATH   COLONOSCOPY     hx of chemotherapy      hx of radiation therapy      I & D EXTREMITY Left 12/23/2018   Procedure: IRRIGATION AND DEBRIDEMENT HAND / INDEX FINGER;  Surgeon: Roseanne Kaufman, MD;  Location: [place];  Service: Orthopedics;  Laterality: Left;   IR IMAGING GUIDED PORT INSERTION  05/06/2020   IR REMOVAL TUN ACCESS W/ PORT W/O FL MOD SED  04/27/2020   KIDNEY STONE SURGERY     LUMBAR PUNCTURE W/ INTRATHECAL CHEMOTHERAPY     MASTECTOMY     PITUITARY SURGERY     RIGHT/LEFT HEART CATH AND CORONARY ANGIOGRAPHY N/A 04/02/2018   Procedure: RIGHT/LEFT HEART CATH AND CORONARY ANGIOGRAPHY;  Surgeon: Burnell Blanks, MD;  Location:  [facility];  Service: Cardiovascular;  Laterality: N/A;   TOOTH EXTRACTION N/A 12/05/2020   Procedure: DENTAL RESTORATION/EXTRACTIONS;  Surgeon: Ronal Fear, MD;  Location: [facility];  Service: Oral Surgery;  Laterality: N/A;   TOTAL THYROIDECTOMY     VIDEO BRONCHOSCOPY Bilateral 11/14/2018   Procedure: VIDEO BRONCHOSCOPY WITHOUT FLUORO;  Surgeon: Margaretha Seeds, MD;  Location: [place] [facility];  Service: Cardiopulmonary;  Laterality: Bilateral;   VIDEO BRONCHOSCOPY WITH ENDOBRONCHIAL ULTRASOUND N/A 11/19/2018   Procedure: VIDEO BRONCHOSCOPY WITH ENDOBRONCHIAL ULTRASOUND;  Surgeon: Margaretha Seeds, MD;  Location: [facility];  Service: Thoracic;  Laterality: N/A;     Current Outpatient Medications  Medication Sig Dispense Refill   albuterol (2.5 MG/3ML) 0.083% NEBU 3 mL, albuterol (5 MG/ML) 0.5% NEBU 0.5 mL Inhale 3 mg into the lungs every 6 (six) hours as needed (SOB).      albuterol (VENTOLIN HFA) 108 (90 Base) MCG/ACT inhaler Inhale 2 puffs into the lungs every 6 (six) hours as needed for wheezing or shortness of breath.     ALPRAZolam (XANAX) 0.5 MG tablet Take 0.5 mg by mouth 2 (two) times daily as needed for anxiety.     alum & mag hydroxide-simeth (MAALOX/MYLANTA) 200-200-20 MG/5ML suspension Take 30 mLs by mouth daily as needed for indigestion or heartburn. For Bleeding Ulcers     aspirin 325 MG tablet Take 325 mg by mouth at bedtime.      azithromycin (ZITHROMAX) 250 MG tablet Take 1 tablet (250 mg total) by mouth daily. 6 tablet 0   beclomethasone (QVAR) 80 MCG/ACT inhaler Inhale 1 puff into the lungs 2 (two) times daily.      beclomethasone (QVAR) 80 MCG/ACT inhaler Inhale 1 puff into the lungs 2 (two) times daily. 1 each 6   benzonatate (TESSALON) 200 MG capsule Take 1 capsule (200 mg total) by mouth 3 (three) times daily as needed for cough. 45 capsule 1   bumetanide (BUMEX) 1 MG tablet TAKE 1 TABLET BY MOUTH TWICE A DAY 180 tablet 1   buPROPion (WELLBUTRIN XL) 150 MG 24  hr tablet TAKE 1 TABLET BY MOUTH EVERY DAY 90 tablet 1   cyclobenzaprine (FLEXERIL) 5 MG tablet Take 1 tablet (5 mg total) by mouth 3 (three) times daily as needed for muscle spasms. 30 tablet 0   dexlansoprazole (DEXILANT) 60 MG capsule Take 60 mg by mouth daily.     Diclofenac Potassium,Migraine, (CAMBIA) 50 MG PACK Take 1 packet by mouth as needed for migraine.     diphenhydrAMINE (BENADRYL) 50 MG/ML injection Inject 1 mL (50 mg total) into the vein every 6 (six) hours as needed (nausea). 25 mL 0   doxycycline (VIBRA-TABS) 100 MG tablet Take 1 tablet (100 mg total) by mouth 2 (two) times daily. 14 tablet 0   dronabinol (MARINOL) 2.5 MG capsule Take 1 capsule (2.5 mg total) by mouth 2 (two) times daily before lunch and supper. 30 capsule 3   EPINEPHrine 0.3  mg/0.3 mL IJ SOAJ injection Inject 0.3 mg into the muscle as needed for anaphylaxis. 2 each 2   escitalopram (LEXAPRO) 20 MG tablet Take 1 tablet (20 mg total) by mouth every evening. 90 tablet 3   fluticasone furoate-vilanterol (BREO ELLIPTA) 200-25 MCG/INH AEPB Inhale 1 puff into the lungs daily. 60 each 3   Galcanezumab-gnlm 120 MG/ML SOAJ Inject 120 mg into the skin every 28 (twenty-eight) days.      heparin lock flush 100 UNIT/ML SOLN injection USE 5 MLS AS A HEPLOCK IN PORT A CATH ONCE DAILY AFTER MEDICATION ADMINISTRATION OR AS DIRECTED BY PHYSICIAN. 150 mL 1   hydrocortisone (CORTEF) 10 MG tablet Take 1-2 tablets (10-20 mg total) by mouth See admin instructions. Take 20 mg in the am and 36m in the evening (Patient taking differently: Take 10-20 mg by mouth See admin instructions. Take 20 mg by mouth in the morning and 169min the evening)     Lancets (ONETOUCH DELICA PLUS [id number] 3 (three) times daily. for testing     levothyroxine (SYNTHROID, LEVOTHROID) 112 MCG tablet Take 112 mcg by mouth daily before breakfast.     LORazepam (ATIVAN) 0.5 MG tablet TAKE 1 TO 2 TABLETS BY MOUTH TWICE A DAY AS NEEDED FOR NAUSEA 30 tablet 1    metoprolol succinate (TOPROL-XL) 25 MG 24 hr tablet Take 37.5 mg by mouth daily.      ONETOUCH VERIO test strip 3 (three) times daily. for testing     OXYGEN Inhale 3-4 L into the lungs continuous.     potassium chloride (KLOR-CON) 10 MEQ tablet Take 10 mEq by mouth daily as needed (low potassium).     predniSONE (DELTASONE) 10 MG tablet Take 4 tabs x 2 days, then 3 x 2d, then 2 x 2d, then 1 x 2d and STOP 20 tablet 0   Rimegepant Sulfate (NURTEC) 75 MG TBDP Take 75 mg by mouth daily as needed (Migraine).      rosuvastatin (CRESTOR) 10 MG tablet Take 10 mg by mouth daily.      silver sulfADIAZINE (SILVADENE) 1 % cream Apply 1 application topically daily as needed (Open Wound).      Sodium Chloride Flush (NORMAL SALINE FLUSH) 0.9 % SOLN USE TWO 10 ML FLUSHES BEFORE AND AFTER FLUIDS AND MEDICATION ADMINISTRATION, TO MAINTAIN PATENCY--USE UP TO 4 TIMES DAILY. 2400 mL 1   sucralfate (CARAFATE) 1 g tablet Take 1 g by mouth daily as needed (FOR ULCERS).      SYNTHROID 100 MCG tablet Take 100 mcg by mouth every morning.     topiramate (TOPAMAX) 50 MG tablet Take 150 mg by mouth at bedtime.      zolpidem (AMBIEN CR) 12.5 MG CR tablet Take 12.5 mg by mouth at bedtime.     albuterol (PROVENTIL) (2.5 MG/3ML) 0.083% nebulizer solution Take 3 mLs (2.5 mg total) by nebulization every 6 (six) hours as needed for wheezing or shortness of breath. 75 mL 12   ARIPiprazole (ABILIFY) 2 MG tablet Take 2 mg by mouth daily. (Patient not taking: Reported on 06/23/2021)     potassium chloride (KLOR-CON) 10 MEQ tablet Take 1 tablet (10 mEq total) by mouth daily for 21 days. 21 tablet 0   Current Facility-Administered Medications  Medication Dose Route Frequency Provider Last Rate Last Admin   0.9 %  sodium chloride infusion   Intravenous PRN ElMargaretha SeedsMD       Mepolizumab SOLR 100 mg  100 mg Subcutaneous Q28 days  Margaretha Seeds, MD   100 mg at 08/31/19 1017    Allergies:   Ferrous bisglycinate chelate [iron],  Mushroom extract complex, Na ferric gluc cplx in sucrose, Cymbalta [duloxetine hcl], Succinylcholine, Buprenorphine hcl, Compazine, Metoclopramide, Morphine and related, Ondansetron, Promethazine hcl, and Tegaderm ag mesh [silver]    Social History:  The patient  reports that she has never smoked. She has never used smokeless tobacco. She reports that she does not currently use alcohol. She reports that she does not use drugs.   Family History:  The patient's Family history is unknown by patient.    ROS:  Please see the history of present illness.   Physical Exam: Blood pressure 114/64, pulse 97, height _0  (1.676 m), weight 194 lb (88 kg), SpO2 99 %.  GEN: Middle-aged female, mildly obese. HEENT: Normal NECK: No JVD; No carotid bruits LYMPHATICS: No lymphadenopathy CARDIAC: RRR 1/6 to 2/6 systolic ejection murmur at the right sternal border.  She has a soft systolic murmur at the left sternal border.  She is status post breast reconstruction. RESPIRATORY:  Clear to auscultation without rales, wheezing or rhonchi  ABDOMEN: Soft, non-tender, non-distended MUSCULOSKELETAL:  No edema; status post amputation of the left index at the PIP joint. SKIN: Warm and dry NEUROLOGIC:  Alert and oriented x 3   EKG: June 23, 2021: Normal sinus rhythm at 97.  No ST or T wave changes.    Recent Labs: 12/08/2020: Magnesium 1.8 04/17/2021: ALT 40; BUN 19; Creatinine 1.24; Hemoglobin 11.6; Platelet Count 395; Potassium 3.3; Sodium 141; TSH 1.210    Lipid Panel    Component Value Date/Time   CHOL 178 09/22/2020 0935   CHOL 204 (H) 01/23/2018 1225   CHOL 207 (H) 12/08/2013 0823   TRIG 253 (H) 09/22/2020 0935   TRIG 96 12/08/2013 0823   HDL 44 09/22/2020 0935   HDL 60 01/23/2018 1225   HDL 57 12/08/2013 0823   CHOLHDL 4.0 09/22/2020 0935   VLDL 51 (H) 09/22/2020 0935   LDLCALC 83 09/22/2020 0935   LDLCALC 107 (H) 01/23/2018 1225   LDLCALC 131 (H) 12/08/2013 0823      Wt Readings  from Last 3 Encounters:  06/23/21 194 lb (88 kg)  04/17/21 197 lb 9.6 oz (89.6 kg)  01/23/21 193 lb 4.8 oz (87.7 kg)      Other studies Reviewed: Additional studies/ records that were reviewed today include: . Review of the above records demonstrates:    ASSESSMENT AND PLAN:    -  Right heart failure:       - Palpitations/premature ventricular contractions -  Palpitations seem to be fairly well controlled.  She has numerous other issues currently.  -  history of Hodgkin's lymphoma-status post mantle radiation   - History of breast cancer-status post Reconstruction Has recurrent breast cancer.    4. History of cervical cancer 5. 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 2015 for regrowth of tumor.  6. History of thyroidectomy - hx of thyroid cancer ,    Check TSH, free T4, free  T3  7. Raynaud's phenomenon 8. Appendectomy 9. History chest pains-normal heart catheterization, normal TEE,,   . 10.   history of adrenal insufficiency  -   11. Palpitations : Her palpitations are fairly well controlled.  She has PVCs.  This has been more pronounced since her COVID infection.   I will see her again in 1 year.  12.  COVID infection: She is  actively wheezing today.  She is on numerous inhalers and is under the care of pulmonology.    Current medicines are reviewed at length with the patient today.  The patient does not have concerns regarding medicines.  The following changes have been made:  no change  Disposition:   FU with me in me in 6 months    Signed, Mertie Moores, MD  06/23/2021 3:41 PM    [facility] [address], [place], [place]  [address] Phone: [phone]; Fax: [phone]

## 2021-06-26 ENCOUNTER — Other Ambulatory Visit (HOSPITAL_COMMUNITY): Payer: Self-pay

## 2021-06-28 DIAGNOSIS — R911 Solitary pulmonary nodule: Secondary | ICD-10-CM | POA: Diagnosis not present

## 2021-07-03 ENCOUNTER — Other Ambulatory Visit (HOSPITAL_COMMUNITY): Payer: Self-pay

## 2021-07-03 ENCOUNTER — Other Ambulatory Visit: Payer: Self-pay | Admitting: Hematology and Oncology

## 2021-07-03 MED ORDER — HEPARIN SOD (PORK) LOCK FLUSH 100 UNIT/ML IV SOLN
INTRAVENOUS | 1 refills | Status: DC
  Filled 2021-07-03: qty 150, 30d supply, fill #0
  Filled 2021-08-04: qty 150, 30d supply, fill #1

## 2021-07-06 DIAGNOSIS — E890 Postprocedural hypothyroidism: Secondary | ICD-10-CM | POA: Diagnosis not present

## 2021-07-06 DIAGNOSIS — Z0000 Encounter for general adult medical examination without abnormal findings: Secondary | ICD-10-CM | POA: Diagnosis not present

## 2021-07-06 DIAGNOSIS — K219 Gastro-esophageal reflux disease without esophagitis: Secondary | ICD-10-CM | POA: Diagnosis not present

## 2021-07-06 DIAGNOSIS — F900 Attention-deficit hyperactivity disorder, predominantly inattentive type: Secondary | ICD-10-CM | POA: Diagnosis not present

## 2021-07-10 ENCOUNTER — Telehealth: Payer: Self-pay | Admitting: Pulmonary Disease

## 2021-07-10 NOTE — Telephone Encounter (Signed)
Called and spoke with patient. She was very tearful on the phone from anxiety from her health issues. She has a port-a-cath in place and has noticed some yellow discharge coming from around the port. Per patient, this has been going on since yesterday. She also stated that stated that she has been struggling more with SOB and at times feels like airways are about to "collapse and close off". She does have some congestion in her chest but she is not able to cough up much. When she is able to cough up the phlegm, it is very thick and cloudy. She did have a slight last week but it went away.   She is scheduled for an endoscopy tomorrow at [facility].   She also mentioned on the phone that she has not been urinating much recently. The most she has urinated has been 3 times a day. She denied any foul odor, pain or visible blood in her urine.   She was tears on the phone stating that she is not sure how much more she can take. She is deathly afraid of going to the hospital and just thought will cause her to have an anxiety attack. I reassured her that we will do whatever is necessary to help her and that she is not alone.   SG, can you please advise? Thanks.

## 2021-07-10 NOTE — Telephone Encounter (Signed)
Called and spoke with patient. She verbalized understanding and expressed appreciation. Her husband is due to be home soon and she will have him to take her to the ED.   Nothing further needed at time of call.

## 2021-07-10 NOTE — Telephone Encounter (Signed)
Pt states she is having discharge from her port. Pt states she is having difficulty breathing, feels like windwipe is closing/not staying open, can't clear mucus,feels like it's pooling. Pt states when she is able to get mucus up it is thick and white. Pt states she isn't able to urinate much, maybe once or twice a day. Pt is on Bumex. Please advise.[phone]

## 2021-07-11 ENCOUNTER — Other Ambulatory Visit: Payer: Self-pay | Admitting: Hematology and Oncology

## 2021-07-11 ENCOUNTER — Other Ambulatory Visit (HOSPITAL_COMMUNITY): Payer: Self-pay

## 2021-07-11 DIAGNOSIS — R1312 Dysphagia, oropharyngeal phase: Secondary | ICD-10-CM | POA: Diagnosis not present

## 2021-07-11 DIAGNOSIS — R110 Nausea: Secondary | ICD-10-CM

## 2021-07-11 DIAGNOSIS — R490 Dysphonia: Secondary | ICD-10-CM | POA: Diagnosis not present

## 2021-07-11 DIAGNOSIS — J383 Other diseases of vocal cords: Secondary | ICD-10-CM | POA: Diagnosis not present

## 2021-07-11 DIAGNOSIS — R1314 Dysphagia, pharyngoesophageal phase: Secondary | ICD-10-CM | POA: Diagnosis not present

## 2021-07-12 ENCOUNTER — Other Ambulatory Visit: Payer: Self-pay | Admitting: Hematology and Oncology

## 2021-07-12 DIAGNOSIS — R110 Nausea: Secondary | ICD-10-CM

## 2021-07-12 MED ORDER — LORAZEPAM 0.5 MG PO TABS: 0.5000 mg | ORAL_TABLET | Freq: Two times a day (BID) | ORAL | 3 refills | Status: DC | PRN

## 2021-07-12 NOTE — Telephone Encounter (Signed)
Refill has been sent to pharmacy by Dr.Gudena

## 2021-07-13 IMAGING — NM NM PULMONARY VENTILATION AND PERFUSION SCAN
8 series · 8 of 8 positions shown · non-contrast
Comparison: Chest radiograph April 17, 2019

CLINICAL DATA: Hemoptysis/shortness of breath

EXAM:
NUCLEAR MEDICINE PERFUSION LUNG SCAN
TECHNIQUE: Perfusion images were obtained in multiple projections after
intravenous injection of radiopharmaceutical.
Ventilation scans intentionally deferred if perfusion scan and chest
x-ray adequate for interpretation during COVID 19 epidemic.
RADIOPHARMACEUTICALS:  1.65 mCi Mc-BBm MAA IV
Views: Anterior, posterior, left lateral, right lateral, RPO, LPO,
RAO, LAO shortness of breath

[Series 1: ant/post perf · 4.14mm/px · 1 of 1 slices shown (1 of 2)]
[im 1/1]
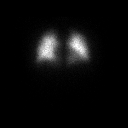

[Series 1: ant/post perf · 4.14mm/px · 1 of 1 slices shown (2 of 2)]
[im 1/1]
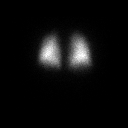

[Series 2: lao/rpo perf · 4.14mm/px · 1 of 1 slices shown (1 of 2)]
[im 1/1]
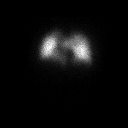

[Series 2: lao/rpo perf · 4.14mm/px · 1 of 1 slices shown (2 of 2)]
[im 1/1]
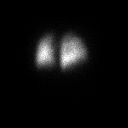

[Series 3: lpo/rao perf · 4.14mm/px · 1 of 1 slices shown (1 of 2)]
[im 1/1]
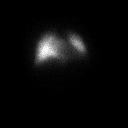

[Series 3: lpo/rao perf · 4.14mm/px · 1 of 1 slices shown (2 of 2)]
[im 1/1]
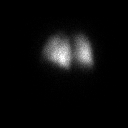

[Series 4: lt lat/rt lat perf · 4.14mm/px · 1 of 1 slices shown (1 of 2)]
[im 1/1]
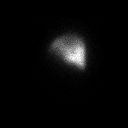

[Series 4: lt lat/rt lat perf · 4.14mm/px · 1 of 1 slices shown (2 of 2)]
[im 1/1]
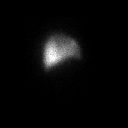

[8 of 8 positions shown; findings below may reference images not displayed]

FINDINGS: Radiotracer uptake is homogeneous and symmetric bilaterally. No
perfusion defects are evident.
IMPRESSION: No appreciable perfusion defects evident. Very low probability of
pulmonary embolus.

## 2021-07-13 IMAGING — CR CHEST - 2 VIEW
2 series · 2 of 2 positions shown · non-contrast
Comparison: 01/30/2019

CLINICAL DATA: Shortness of breath.  Hemoptysis.

EXAM:
CHEST - 2 VIEW

[chest pa]
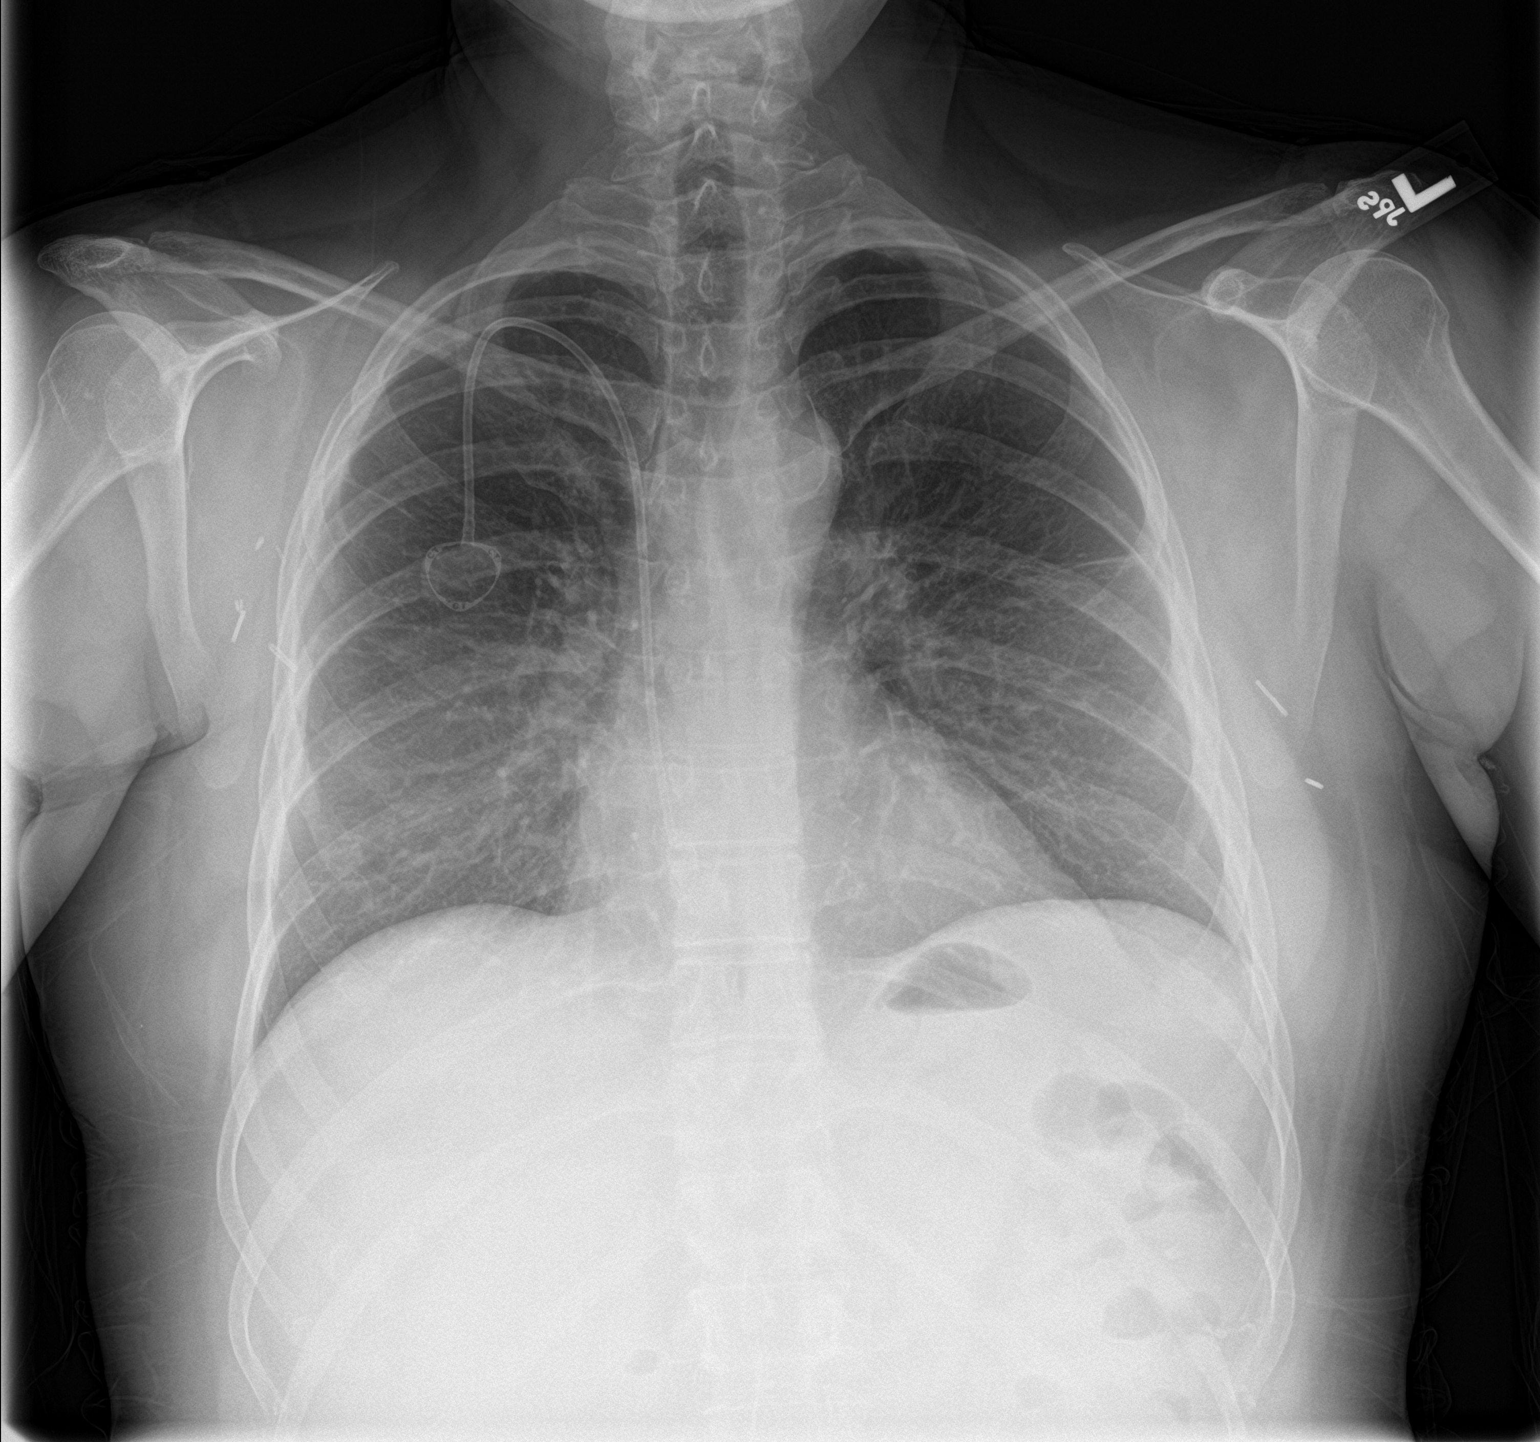

[chest lat]
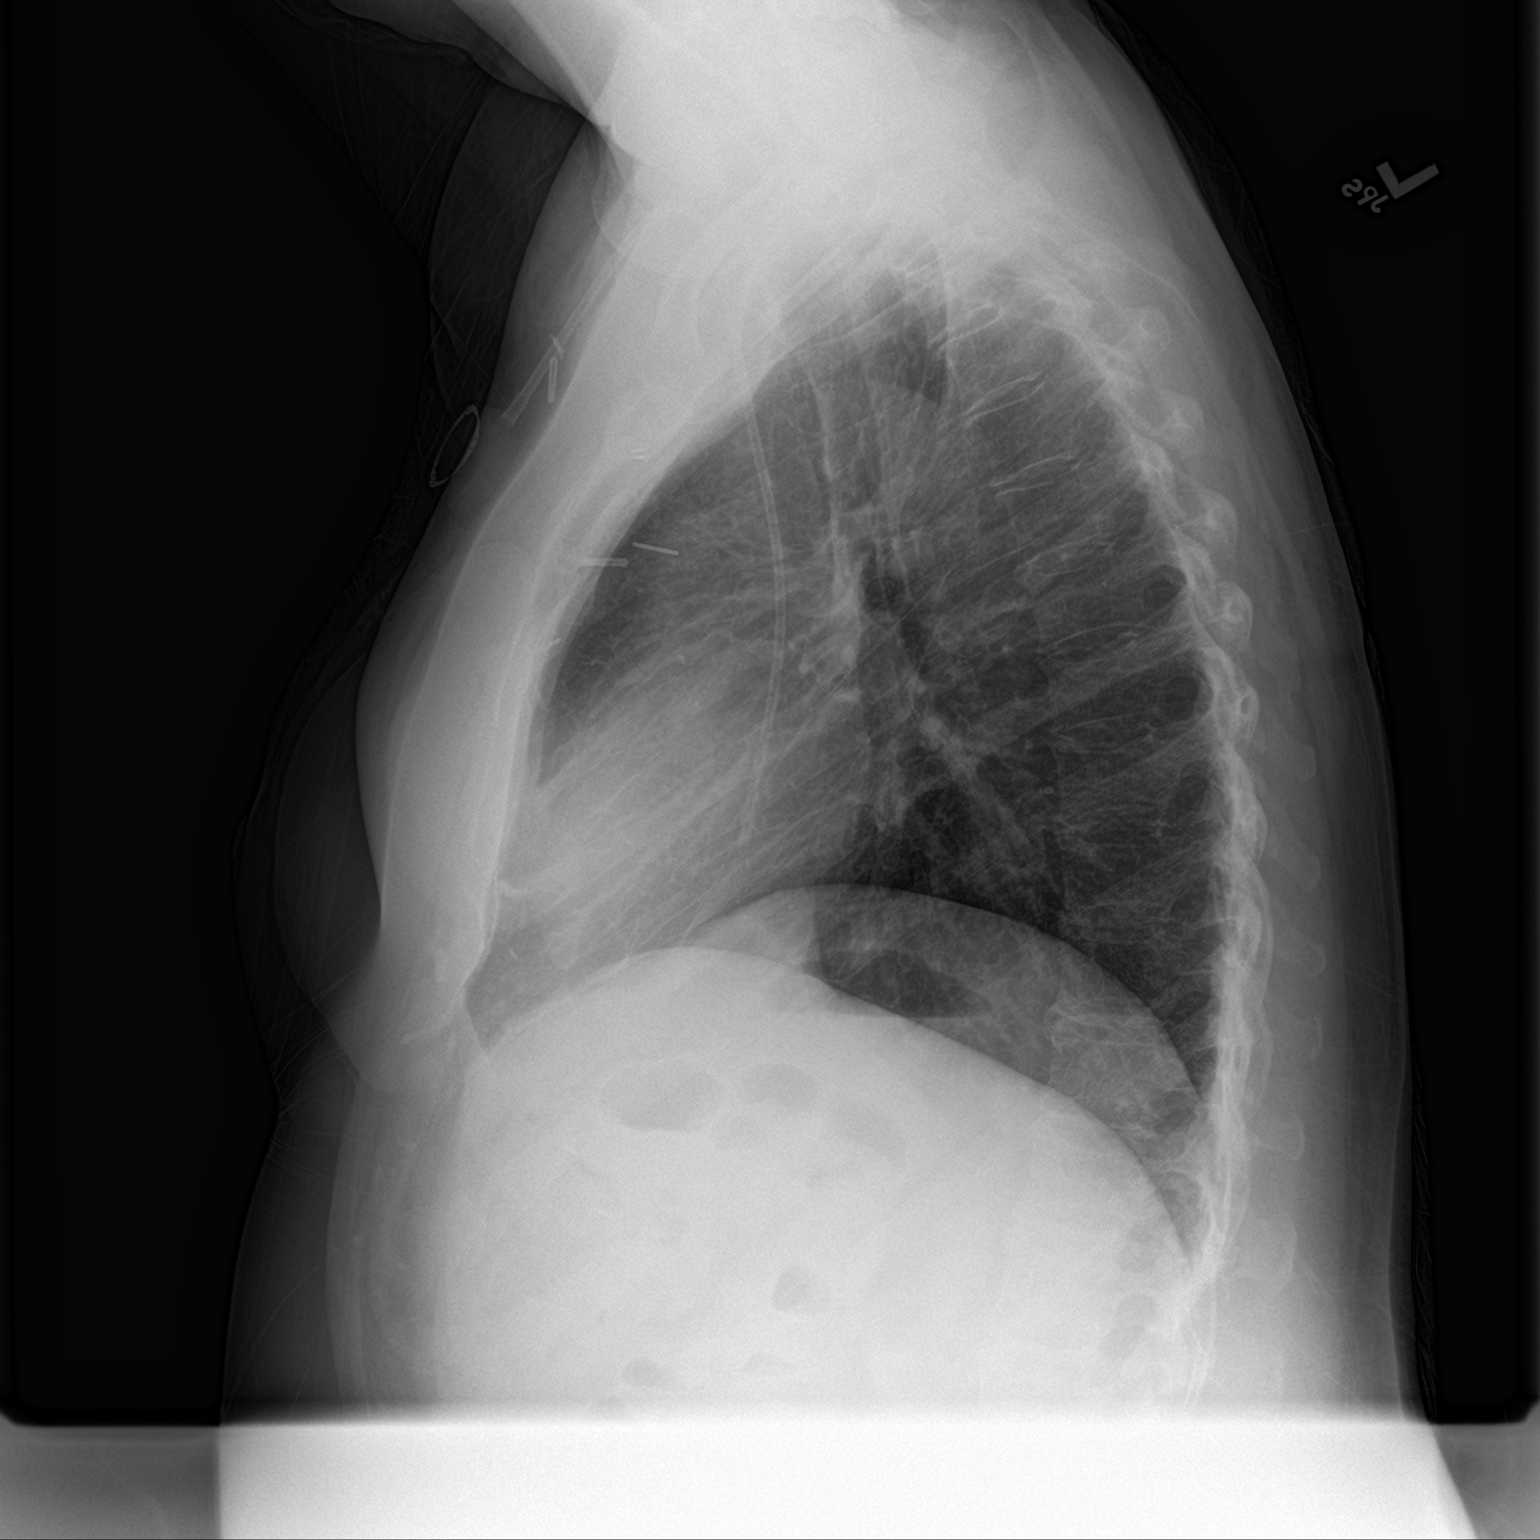

[2 of 2 positions shown; findings below may reference images not displayed]

FINDINGS: Cardiac silhouette is normal in size. No mediastinal or hilar
masses. No evidence of adenopathy.

Minor linear scarring or subsegmental atelectasis in the left mid
lung. Lungs otherwise clear. No pleural effusion or pneumothorax.

Stable changes from breast surgery. Right anterior chest wall
Port-A-Cath, tip right atrium, stable.

Skeletal structures are intact.
IMPRESSION: No active cardiopulmonary disease.

## 2021-07-14 ENCOUNTER — Other Ambulatory Visit (HOSPITAL_COMMUNITY): Payer: Self-pay

## 2021-07-16 ENCOUNTER — Other Ambulatory Visit: Payer: Self-pay | Admitting: Hematology and Oncology

## 2021-07-17 ENCOUNTER — Encounter: Payer: Self-pay | Admitting: Hematology and Oncology

## 2021-07-18 ENCOUNTER — Inpatient Hospital Stay: Payer: BC Managed Care – PPO

## 2021-07-20 ENCOUNTER — Encounter: Payer: Self-pay | Admitting: Hematology and Oncology

## 2021-07-20 ENCOUNTER — Inpatient Hospital Stay: Payer: BC Managed Care – PPO | Attending: Hematology and Oncology

## 2021-07-25 ENCOUNTER — Other Ambulatory Visit: Payer: BC Managed Care – PPO

## 2021-07-25 ENCOUNTER — Ambulatory Visit: Payer: BC Managed Care – PPO | Admitting: Hematology and Oncology

## 2021-07-28 DIAGNOSIS — R911 Solitary pulmonary nodule: Secondary | ICD-10-CM | POA: Diagnosis not present

## 2021-08-01 ENCOUNTER — Ambulatory Visit: Payer: BC Managed Care – PPO | Admitting: Cardiovascular Disease

## 2021-08-03 ENCOUNTER — Ambulatory Visit: Payer: BC Managed Care – PPO | Admitting: Adult Health

## 2021-08-03 DIAGNOSIS — E890 Postprocedural hypothyroidism: Secondary | ICD-10-CM | POA: Diagnosis not present

## 2021-08-03 DIAGNOSIS — E1169 Type 2 diabetes mellitus with other specified complication: Secondary | ICD-10-CM | POA: Diagnosis not present

## 2021-08-03 DIAGNOSIS — D443 Neoplasm of uncertain behavior of pituitary gland: Secondary | ICD-10-CM | POA: Diagnosis not present

## 2021-08-03 DIAGNOSIS — E2740 Unspecified adrenocortical insufficiency: Secondary | ICD-10-CM | POA: Diagnosis not present

## 2021-08-04 ENCOUNTER — Telehealth (HOSPITAL_COMMUNITY): Payer: Self-pay

## 2021-08-04 ENCOUNTER — Other Ambulatory Visit (HOSPITAL_COMMUNITY): Payer: Self-pay

## 2021-08-04 ENCOUNTER — Other Ambulatory Visit: Payer: Self-pay | Admitting: Hematology and Oncology

## 2021-08-04 ENCOUNTER — Encounter (HOSPITAL_COMMUNITY): Payer: Self-pay

## 2021-08-04 MED ORDER — NORMAL SALINE FLUSH 0.9 % IV SOLN
INTRAVENOUS | 1 refills | Status: DC
  Filled 2021-08-04: qty 2400, 30d supply, fill #0
  Filled 2021-08-28: qty 2400, 15d supply, fill #1

## 2021-08-04 NOTE — Telephone Encounter (Signed)
Attempted to call patient in regards to Pulmonary Rehab - LM on VM Mailed letter 

## 2021-08-05 ENCOUNTER — Other Ambulatory Visit (HOSPITAL_COMMUNITY): Payer: Self-pay

## 2021-08-05 ENCOUNTER — Encounter: Payer: Self-pay | Admitting: Hematology and Oncology

## 2021-08-18 NOTE — Telephone Encounter (Signed)
No response from pt in regards to Pulmonary Rehab. Closed referral

## 2021-08-28 ENCOUNTER — Other Ambulatory Visit (HOSPITAL_COMMUNITY): Payer: Self-pay

## 2021-08-28 DIAGNOSIS — R911 Solitary pulmonary nodule: Secondary | ICD-10-CM | POA: Diagnosis not present

## 2021-08-29 ENCOUNTER — Other Ambulatory Visit (HOSPITAL_COMMUNITY): Payer: Self-pay

## 2021-09-06 ENCOUNTER — Other Ambulatory Visit (HOSPITAL_COMMUNITY): Payer: Self-pay

## 2021-09-06 ENCOUNTER — Other Ambulatory Visit: Payer: Self-pay | Admitting: Hematology and Oncology

## 2021-09-07 ENCOUNTER — Encounter: Payer: Self-pay | Admitting: Hematology and Oncology

## 2021-09-07 ENCOUNTER — Other Ambulatory Visit (HOSPITAL_COMMUNITY): Payer: Self-pay

## 2021-09-07 MED ORDER — HEPARIN SOD (PORK) LOCK FLUSH 100 UNIT/ML IV SOLN
INTRAVENOUS | 1 refills | Status: DC
Start: 2021-09-07 — End: 2021-10-30
  Filled 2021-09-07: qty 150, 30d supply, fill #0
  Filled 2021-10-06: qty 150, 30d supply, fill #1

## 2021-09-12 DIAGNOSIS — G8929 Other chronic pain: Secondary | ICD-10-CM | POA: Diagnosis not present

## 2021-09-12 DIAGNOSIS — G43009 Migraine without aura, not intractable, without status migrainosus: Secondary | ICD-10-CM | POA: Diagnosis not present

## 2021-09-12 DIAGNOSIS — M4807 Spinal stenosis, lumbosacral region: Secondary | ICD-10-CM | POA: Diagnosis not present

## 2021-09-12 DIAGNOSIS — M5442 Lumbago with sciatica, left side: Secondary | ICD-10-CM | POA: Diagnosis not present

## 2021-09-15 ENCOUNTER — Other Ambulatory Visit: Payer: Self-pay | Admitting: Hematology and Oncology

## 2021-09-15 ENCOUNTER — Other Ambulatory Visit (HOSPITAL_COMMUNITY): Payer: Self-pay

## 2021-09-15 DIAGNOSIS — R110 Nausea: Secondary | ICD-10-CM

## 2021-09-15 MED ORDER — NORMAL SALINE FLUSH 0.9 % IV SOLN
INTRAVENOUS | 1 refills | Status: DC
  Filled 2021-09-15: qty 2400, 30d supply, fill #0
  Filled 2021-09-18: qty 2400, 15d supply, fill #0
  Filled 2021-10-09: qty 2400, 15d supply, fill #1

## 2021-09-18 ENCOUNTER — Other Ambulatory Visit (HOSPITAL_COMMUNITY): Payer: Self-pay

## 2021-09-19 ENCOUNTER — Other Ambulatory Visit (HOSPITAL_COMMUNITY): Payer: Self-pay

## 2021-09-26 ENCOUNTER — Other Ambulatory Visit: Payer: Self-pay | Admitting: Pulmonary Disease

## 2021-09-27 DIAGNOSIS — R911 Solitary pulmonary nodule: Secondary | ICD-10-CM | POA: Diagnosis not present

## 2021-10-01 DIAGNOSIS — F447 Conversion disorder with mixed symptom presentation: Secondary | ICD-10-CM

## 2021-10-01 HISTORY — DX: Conversion disorder with mixed symptom presentation: F44.7

## 2021-10-06 ENCOUNTER — Other Ambulatory Visit (HOSPITAL_COMMUNITY): Payer: Self-pay

## 2021-10-06 ENCOUNTER — Encounter: Payer: Self-pay | Admitting: Hematology and Oncology

## 2021-10-09 ENCOUNTER — Other Ambulatory Visit (HOSPITAL_COMMUNITY): Payer: Self-pay

## 2021-10-10 ENCOUNTER — Other Ambulatory Visit (HOSPITAL_COMMUNITY): Payer: Self-pay

## 2021-10-17 NOTE — Progress Notes (Incomplete)
Patient Care Team: Nicholas Lose, MD as PCP - General (Hematology and Oncology) Nahser, Wonda Cheng, MD as PCP - Cardiology (Cardiology) Maylon Peppers, MD (Family Medicine)  DIAGNOSIS: No diagnosis found.  SUMMARY OF ONCOLOGIC HISTORY: Oncology History  Hx of Hodgkins lymphoma   Initial Diagnosis   Hx of Hodgkins lymphoma    Radiation Therapy      History of thyroid cancer   Initial Diagnosis   Thyroid cancer (HCC)    Remission       Remission       Radiation Therapy      History of cervical cancer  12/23/2018 Initial Diagnosis   History of cervical cancer    Remission       Radiation Therapy        CHIEF COMPLIANT: Follow-up of SVT, iron deficiency anemia on surveillance  INTERVAL HISTORY: [name] is a 50 y.o. 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 She presents to the clinic today for follow-up.   ALLERGIES:  is allergic to ferrous bisglycinate chelate [iron], mushroom extract complex, na ferric gluc cplx in sucrose, cymbalta [duloxetine hcl], succinylcholine, buprenorphine hcl, compazine, metoclopramide, morphine and related, ondansetron, promethazine hcl, and tegaderm ag mesh [silver].  MEDICATIONS:  Current Outpatient Medications  Medication Sig Dispense Refill   albuterol (2.5 MG/3ML) 0.083% NEBU 3 mL, albuterol (5 MG/ML) 0.5% NEBU 0.5 mL Inhale 3 mg into the lungs every 6 (six) hours as needed (SOB).      albuterol (PROVENTIL) (2.5 MG/3ML) 0.083% nebulizer solution Take 3 mLs (2.5 mg total) by nebulization every 6 (six) hours as needed for wheezing or shortness of breath. 75 mL 12   albuterol (VENTOLIN HFA) 108 (90 Base) MCG/ACT inhaler Inhale 2 puffs into the lungs every 6 (six) hours as needed for wheezing or shortness of breath.     ALPRAZolam (XANAX) 0.5 MG tablet Take 0.5 mg by mouth 2 (two) times daily as needed for anxiety.     alum & mag  hydroxide-simeth (MAALOX/MYLANTA) 200-200-20 MG/5ML suspension Take 30 mLs by mouth daily as needed for indigestion or heartburn. For Bleeding Ulcers     ARIPiprazole (ABILIFY) 2 MG tablet Take 2 mg by mouth daily. (Patient not taking: Reported on 06/23/2021)     aspirin 325 MG tablet Take 325 mg by mouth at bedtime.      azithromycin (ZITHROMAX) 250 MG tablet Take 1 tablet (250 mg total) by mouth daily. 6 tablet 0   beclomethasone (QVAR) 80 MCG/ACT inhaler Inhale 1 puff into the lungs 2 (two) times daily.      beclomethasone (QVAR) 80 MCG/ACT inhaler Inhale 1 puff into the lungs 2 (two) times daily. 1 each 6   benzonatate (TESSALON) 200 MG capsule Take 1 capsule (200 mg total) by mouth 3 (three) times daily as needed for cough. 45 capsule 1   BREO ELLIPTA 200-25 MCG/ACT AEPB TAKE 1 PUFF BY MOUTH EVERY DAY 60 each 3   bumetanide (BUMEX) 1 MG tablet TAKE 1 TABLET BY MOUTH TWICE A DAY 180 tablet 1   buPROPion (WELLBUTRIN XL) 150 MG 24 hr tablet TAKE 1 TABLET BY MOUTH EVERY DAY 90 tablet 1   cyclobenzaprine (FLEXERIL) 5 MG tablet Take 1 tablet (5 mg total) by mouth 3 (three) times daily as needed for muscle spasms. 30 tablet 0   dexlansoprazole (DEXILANT) 60 MG capsule Take 60 mg by mouth daily.     Diclofenac Potassium,Migraine, (CAMBIA)  50 MG PACK Take 1 packet by mouth as needed for migraine.     diphenhydrAMINE (BENADRYL) 50 MG/ML injection Inject 1 mL (50 mg total) into the vein every 6 (six) hours as needed (nausea). 25 mL 0   doxycycline (VIBRA-TABS) 100 MG tablet Take 1 tablet (100 mg total) by mouth 2 (two) times daily. 14 tablet 0   dronabinol (MARINOL) 2.5 MG capsule Take 1 capsule (2.5 mg total) by mouth 2 (two) times daily before lunch and supper. 30 capsule 3   EPINEPHrine 0.3 mg/0.3 mL IJ SOAJ injection Inject 0.3 mg into the muscle as needed for anaphylaxis. 2 each 2   escitalopram (LEXAPRO) 20 MG tablet Take 1 tablet (20 mg total) by mouth every evening. 90 tablet 3   Galcanezumab-gnlm  120 MG/ML SOAJ Inject 120 mg into the skin every 28 (twenty-eight) days.      heparin lock flush 100 UNIT/ML SOLN injection USE 5 MLS AS A HEPLOCK IN PORT A CATH ONCE DAILY AFTER MEDICATION ADMINISTRATION OR AS DIRECTED BY PHYSICIAN. 150 mL 1   hydrocortisone (CORTEF) 10 MG tablet Take 1-2 tablets (10-20 mg total) by mouth See admin instructions. Take 20 mg in the am and 3m in the evening (Patient taking differently: Take 10-20 mg by mouth See admin instructions. Take 20 mg by mouth in the morning and 174min the evening)     Lancets (ONETOUCH DELICA PLUS [id number] 3 (three) times daily. for testing     levothyroxine (SYNTHROID, LEVOTHROID) 112 MCG tablet Take 112 mcg by mouth daily before breakfast.     LORazepam (ATIVAN) 0.5 MG tablet TAKE 1 TABLET BY MOUTH 2 TIMES DAILY AS NEEDED FOR ANXIETY. 30 tablet 3   metoprolol succinate (TOPROL-XL) 25 MG 24 hr tablet Take 37.5 mg by mouth daily.      ONETOUCH VERIO test strip 3 (three) times daily. for testing     OXYGEN Inhale 3-4 L into the lungs continuous.     potassium chloride (KLOR-CON) 10 MEQ tablet Take 1 tablet (10 mEq total) by mouth daily for 21 days. 21 tablet 0   potassium chloride (KLOR-CON) 10 MEQ tablet Take 10 mEq by mouth daily as needed (low potassium).     predniSONE (DELTASONE) 10 MG tablet Take 4 tabs x 2 days, then 3 x 2d, then 2 x 2d, then 1 x 2d and STOP 20 tablet 0   Rimegepant Sulfate (NURTEC) 75 MG TBDP Take 75 mg by mouth daily as needed (Migraine).      rosuvastatin (CRESTOR) 10 MG tablet Take 10 mg by mouth daily.      silver sulfADIAZINE (SILVADENE) 1 % cream Apply 1 application topically daily as needed (Open Wound).      Sodium Chloride Flush (NORMAL SALINE FLUSH) 0.9 % SOLN FLUSH WITH TWO 10 ML FLUSHES IN PORT BEFORE AND AFTER FLUIDS AND MEDICATION ADMINISTRATION, TO MAINTAIN PATENCY--USE UP TO 4 TIMES DAILY. 2400 mL 1   sucralfate (CARAFATE) 1 g tablet Take 1 g by mouth daily as needed (FOR ULCERS).       SYNTHROID 100 MCG tablet Take 100 mcg by mouth every morning.     topiramate (TOPAMAX) 50 MG tablet Take 150 mg by mouth at bedtime.      zolpidem (AMBIEN CR) 12.5 MG CR tablet Take 12.5 mg by mouth at bedtime.     Current Facility-Administered Medications  Medication Dose Route Frequency Provider Last Rate Last Admin   0.9 %  sodium chloride infusion  Intravenous PRN Margaretha Seeds, MD       Mepolizumab SOLR 100 mg  100 mg Subcutaneous Q28 days Margaretha Seeds, MD   100 mg at 08/31/19 1017    PHYSICAL EXAMINATION: ECOG PERFORMANCE STATUS: {CHL ONC ECOG PS:[id number]}  There were no vitals filed for this visit. There were no vitals filed for this visit.  BREAST:*** No palpable masses or nodules in either right or left breasts. No palpable axillary supraclavicular or infraclavicular adenopathy no breast tenderness or nipple discharge. (exam performed in the presence of a chaperone)  LABORATORY DATA:  I have reviewed the data as listed CMP Latest Ref Rng & Units 04/17/2021 01/23/2021 12/09/2020  Glucose 70 - 99 mg/dL 144(H) 152(H) 94  BUN 6 - 20 mg/dL 19 28(H) 15  Creatinine 0.44 - 1.00 mg/dL 1.24(H) 1.34(H) 1.12(H)  Sodium 135 - 145 mmol/L 141 139 139  Potassium 3.5 - 5.1 mmol/L 3.3(L) 3.0(L) 3.3(L)  Chloride 98 - 111 mmol/L 103 99 103  CO2 22 - 32 mmol/L _0 Calcium 8.9 - 10.3 mg/dL 9.7 9.6 8.8(L)  Total Protein 6.5 - 8.1 g/dL 8.0 8.0 6.5  Total Bilirubin 0.3 - 1.2 mg/dL 0.3 0.3 0.7  Alkaline Phos 38 - 126 U/L 99 81 57  AST 15 - 41 U/L _1 ALT 0 - 44 U/L 40 36 32    Lab Results  Component Value Date   WBC 9.5 04/17/2021   HGB 11.6 (L) 04/17/2021   HCT 36.5 04/17/2021   MCV 80.6 04/17/2021   PLT 395 04/17/2021   NEUTROABS 5.5 04/17/2021    ASSESSMENT & PLAN:  No problem-specific Assessment & Plan notes found for this encounter.    No orders of the defined types were placed in this encounter.  The patient has a good understanding of the overall plan.  she agrees with it. she will call with any problems that may develop before the next visit here.  Total time spent: *** mins including face to face time and time spent for planning, charting and coordination of care  Rulon Eisenmenger, MD, MPH 10/17/2021  I, Thana Ates, am acting as scribe for Dr. Nicholas Lose.  {insert scribe attestation}

## 2021-10-18 ENCOUNTER — Other Ambulatory Visit: Payer: Self-pay | Admitting: *Deleted

## 2021-10-18 ENCOUNTER — Inpatient Hospital Stay: Payer: BC Managed Care – PPO

## 2021-10-18 ENCOUNTER — Ambulatory Visit: Payer: BC Managed Care – PPO | Admitting: Hematology and Oncology

## 2021-10-18 ENCOUNTER — Inpatient Hospital Stay: Payer: BC Managed Care – PPO | Admitting: Hematology and Oncology

## 2021-10-18 ENCOUNTER — Other Ambulatory Visit: Payer: BC Managed Care – PPO

## 2021-10-18 NOTE — Assessment & Plan Note (Deleted)
History of DCIS, thyroid cancer, cervical cancer, Hodgkin's lymphoma: In remission Lymphedema: Currently on Bumex. Renal insufficiency due to Bumex: Creatinine: 1.24 on 01/23/21 History of anemia: Hemoglobin appears to be stable at 1 11.6  Severe depression: We will request [place] [facility] to find a psychiatrist for her to be seen. She thinks that the dosage of Lexapro is not sufficient.  Her daughter has distanced herself and this is causing her severe heartache because of that she has not been able to see her granddaughter.  Return to clinic in 1 year for follow-up to see me.

## 2021-10-18 NOTE — Assessment & Plan Note (Signed)
History of DCIS, thyroid cancer, cervical cancer, Hodgkin's lymphoma: In remission Lymphedema: Currently on Bumex. Renal insufficiency due to Bumex: Creatinine: 1.24 on 01/23/21 History of anemia: Hemoglobin appears to be stable at 1 11.6  Severe depression  Breast Cancer Surveillance: Breast exam : Benigb Mammograms:

## 2021-10-18 NOTE — Progress Notes (Signed)
Patient Care Team: Nicholas Lose, MD as PCP - General (Hematology and Oncology) Nahser, Wonda Cheng, MD as PCP - Cardiology (Cardiology) Maylon Peppers, MD (Family Medicine)  DIAGNOSIS:    ICD-10-CM   1. History of ductal carcinoma in situ (DCIS) of breast  Z86.000 CT CHEST ABDOMEN PELVIS W CONTRAST    CBC with Differential (Cancer Center Only)    CMP (Cancer Center only)      SUMMARY OF ONCOLOGIC HISTORY: Oncology History  Hx of Hodgkins lymphoma   Initial Diagnosis   Hx of Hodgkins lymphoma    Radiation Therapy      History of thyroid cancer   Initial Diagnosis   Thyroid cancer (HCC)    Remission       Remission       Radiation Therapy      History of cervical cancer  12/23/2018 Initial Diagnosis   History of cervical cancer    Remission       Radiation Therapy        CHIEF COMPLIANT: Follow-up of SVT, iron deficiency anemia on surveillance  INTERVAL HISTORY: [name] is a 51 y.o. 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 She presents to the clinic today for follow-up.  She continues to have multiple emotional stressors in her life.  She continues to suffer from profound fatigue and generalized aches and pains.  She continues to retain fluid throughout her body.  ALLERGIES:  is allergic to ferrous bisglycinate chelate [iron], mushroom extract complex, na ferric gluc cplx in sucrose, cymbalta [duloxetine hcl], succinylcholine, buprenorphine hcl, compazine, metoclopramide, morphine and related, ondansetron, promethazine hcl, and tegaderm ag mesh [silver].  MEDICATIONS:  Current Outpatient Medications  Medication Sig Dispense Refill   sitaGLIPtin (JANUVIA) 100 MG tablet Take 1 tablet (100 mg total) by mouth daily.     albuterol (2.5 MG/3ML) 0.083% NEBU 3 mL, albuterol (5 MG/ML) 0.5% NEBU 0.5 mL Inhale 3 mg into the lungs every 6 (six) hours as needed (SOB).       albuterol (PROVENTIL) (2.5 MG/3ML) 0.083% nebulizer solution Take 3 mLs (2.5 mg total) by nebulization every 6 (six) hours as needed for wheezing or shortness of breath. 75 mL 12   albuterol (VENTOLIN HFA) 108 (90 Base) MCG/ACT inhaler Inhale 2 puffs into the lungs every 6 (six) hours as needed for wheezing or shortness of breath.     ALPRAZolam (XANAX) 0.5 MG tablet Take 0.5 mg by mouth 2 (two) times daily as needed for anxiety.     alum & mag hydroxide-simeth (MAALOX/MYLANTA) 200-200-20 MG/5ML suspension Take 30 mLs by mouth daily as needed for indigestion or heartburn. For Bleeding Ulcers     ARIPiprazole (ABILIFY) 2 MG tablet Take 2 mg by mouth daily. (Patient not taking: Reported on 06/23/2021)     aspirin 325 MG tablet Take 325 mg by mouth at bedtime.      beclomethasone (QVAR) 80 MCG/ACT inhaler Inhale 1 puff into the lungs 2 (two) times daily.      beclomethasone (QVAR) 80 MCG/ACT inhaler Inhale 1 puff into the lungs 2 (two) times daily. 1 each 6   benzonatate (TESSALON) 200 MG capsule Take 1 capsule (200 mg total) by mouth 3 (three) times daily as needed for cough. 45 capsule 1   BREO ELLIPTA 200-25 MCG/ACT AEPB TAKE 1 PUFF BY MOUTH EVERY DAY 60 each 3   bumetanide (BUMEX) 1 MG tablet TAKE 1 TABLET BY MOUTH TWICE  A DAY 180 tablet 1   buPROPion (WELLBUTRIN XL) 150 MG 24 hr tablet TAKE 1 TABLET BY MOUTH EVERY DAY 90 tablet 1   cyclobenzaprine (FLEXERIL) 5 MG tablet Take 1 tablet (5 mg total) by mouth 3 (three) times daily as needed for muscle spasms. 30 tablet 0   dexlansoprazole (DEXILANT) 60 MG capsule Take 60 mg by mouth daily.     Diclofenac Potassium,Migraine, (CAMBIA) 50 MG PACK Take 1 packet by mouth as needed for migraine.     diphenhydrAMINE (BENADRYL) 50 MG/ML injection Inject 1 mL (50 mg total) into the vein every 6 (six) hours as needed (nausea). 25 mL 0   doxycycline (VIBRA-TABS) 100 MG tablet Take 1 tablet (100 mg total) by mouth 2 (two) times daily. 14 tablet 0   dronabinol  (MARINOL) 2.5 MG capsule Take 1 capsule (2.5 mg total) by mouth 2 (two) times daily before lunch and supper. 30 capsule 3   EPINEPHrine 0.3 mg/0.3 mL IJ SOAJ injection Inject 0.3 mg into the muscle as needed for anaphylaxis. 2 each 2   escitalopram (LEXAPRO) 20 MG tablet Take 1 tablet (20 mg total) by mouth every evening. 90 tablet 3   Galcanezumab-gnlm 120 MG/ML SOAJ Inject 120 mg into the skin every 28 (twenty-eight) days.      heparin lock flush 100 UNIT/ML SOLN injection USE 5 MLS AS A HEPLOCK IN PORT A CATH ONCE DAILY AFTER MEDICATION ADMINISTRATION OR AS DIRECTED BY PHYSICIAN. 150 mL 1   hydrocortisone (CORTEF) 10 MG tablet Take 1-2 tablets (10-20 mg total) by mouth See admin instructions. Take 20 mg in the am and 69m in the evening (Patient taking differently: Take 10-20 mg by mouth See admin instructions. Take 20 mg by mouth in the morning and 124min the evening)     Lancets (ONETOUCH DELICA PLUS [id number] 3 (three) times daily. for testing     levothyroxine (SYNTHROID, LEVOTHROID) 112 MCG tablet Take 112 mcg by mouth daily before breakfast.     LORazepam (ATIVAN) 0.5 MG tablet TAKE 1 TABLET BY MOUTH 2 TIMES DAILY AS NEEDED FOR ANXIETY. 30 tablet 3   metoprolol succinate (TOPROL-XL) 25 MG 24 hr tablet Take 37.5 mg by mouth daily.      ONETOUCH VERIO test strip 3 (three) times daily. for testing     OXYGEN Inhale 3-4 L into the lungs continuous.     potassium chloride (KLOR-CON) 10 MEQ tablet Take 1 tablet (10 mEq total) by mouth daily for 21 days. 21 tablet 0   potassium chloride (KLOR-CON) 10 MEQ tablet Take 10 mEq by mouth daily as needed (low potassium).     predniSONE (DELTASONE) 10 MG tablet Take 4 tabs x 2 days, then 3 x 2d, then 2 x 2d, then 1 x 2d and STOP 20 tablet 0   Rimegepant Sulfate (NURTEC) 75 MG TBDP Take 75 mg by mouth daily as needed (Migraine).      rosuvastatin (CRESTOR) 10 MG tablet Take 10 mg by mouth daily.      silver sulfADIAZINE (SILVADENE) 1 % cream Apply 1  application topically daily as needed (Open Wound).      Sodium Chloride Flush (NORMAL SALINE FLUSH) 0.9 % SOLN FLUSH WITH TWO 10 ML FLUSHES IN PORT BEFORE AND AFTER FLUIDS AND MEDICATION ADMINISTRATION, TO MAINTAIN PATENCY--USE UP TO 4 TIMES DAILY. 2400 mL 1   sucralfate (CARAFATE) 1 g tablet Take 1 g by mouth daily as needed (FOR ULCERS).      SYNTHROID  100 MCG tablet Take 1 tablet (100 mcg total) by mouth every morning.     topiramate (TOPAMAX) 50 MG tablet Take 150 mg by mouth at bedtime.      zolpidem (AMBIEN CR) 12.5 MG CR tablet Take 12.5 mg by mouth at bedtime.     Current Facility-Administered Medications  Medication Dose Route Frequency Provider Last Rate Last Admin   0.9 %  sodium chloride infusion   Intravenous PRN Margaretha Seeds, MD       Mepolizumab SOLR 100 mg  100 mg Subcutaneous Q28 days Margaretha Seeds, MD   100 mg at 08/31/19 1017    PHYSICAL EXAMINATION: ECOG PERFORMANCE STATUS: 1 - Symptomatic but completely ambulatory  Vitals:   10/19/21 1013  BP: 117/76  Pulse: (!) 104  Resp: 18  Temp: 97.8 F (36.6 C)  SpO2: 99%   Filed Weights   10/19/21 1013  Weight: 191 lb 3.2 oz (86.7 kg)       LABORATORY DATA:  I have reviewed the data as listed CMP Latest Ref Rng & Units 10/19/2021 04/17/2021 01/23/2021  Glucose 70 - 99 mg/dL 122(H) 144(H) 152(H)  BUN 6 - 20 mg/dL 23(H) 19 28(H)  Creatinine 0.44 - 1.00 mg/dL 1.14(H) 1.24(H) 1.34(H)  Sodium 135 - 145 mmol/L 140 141 139  Potassium 3.5 - 5.1 mmol/L 3.5 3.3(L) 3.0(L)  Chloride 98 - 111 mmol/L 103 103 99  CO2 22 - 32 mmol/L _0 Calcium 8.9 - 10.3 mg/dL 9.6 9.7 9.6  Total Protein 6.5 - 8.1 g/dL 7.8 8.0 8.0  Total Bilirubin 0.3 - 1.2 mg/dL 0.3 0.3 0.3  Alkaline Phos 38 - 126 U/L 86 99 81  AST 15 - 41 U/L _1 ALT 0 - 44 U/L 30 40 36    Lab Results  Component Value Date   WBC 8.7 10/19/2021   HGB 11.6 (L) 10/19/2021   HCT 37.7 10/19/2021   MCV 78.7 (L) 10/19/2021   PLT 374 10/19/2021    NEUTROABS 5.1 10/19/2021    ASSESSMENT & PLAN:  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 Renal insufficiency due to Bumex: Creatinine: 1.24 on 01/23/21 History of anemia: Hemoglobin appears to be stable at 11.6   Severe depression: Patient seeing psychologist and psychiatrist at [facility] psychiatry.  She is currently also on Wellbutrin.  Breast Cancer Surveillance: Breast exam : Benign   History of Hodgkin's lymphoma, cervical cancer and thyroid cancer Return to clinic in 6 months with labs and follow-up  Orders Placed This Encounter  Procedures   CT CHEST ABDOMEN PELVIS W CONTRAST    Standing Status:   Future    Standing Expiration Date:   10/19/2022    Order Specific Question:   Is patient pregnant?    Answer:   No    Order Specific Question:   Preferred imaging location?    Answer:   [facility]    Order Specific Question:   Release to patient    Answer:   Immediate    Order Specific Question:   Is Oral Contrast requested for this exam?    Answer:   Yes, Per Radiology protocol   CBC with Differential ([place] Only)    Standing Status:   Future    Standing Expiration Date:   10/19/2022   CMP ([place] only)    Standing Status:   Future  Standing Expiration Date:   10/19/2022   The patient has a good understanding of the overall plan. she agrees with it. she will call with any problems that may develop before the next visit here.  Total time spent: 20 mins including face to face time and time spent for planning, charting and coordination of care  Rulon Eisenmenger, MD, MPH 10/19/2021  I, Thana Ates, am acting as scribe for Dr. Nicholas Lose.  I have reviewed the above documentation for accuracy and completeness, and I agree with the above.

## 2021-10-19 ENCOUNTER — Other Ambulatory Visit: Payer: Self-pay

## 2021-10-19 ENCOUNTER — Inpatient Hospital Stay: Payer: BC Managed Care – PPO | Attending: Hematology and Oncology | Admitting: Hematology and Oncology

## 2021-10-19 ENCOUNTER — Inpatient Hospital Stay: Payer: BC Managed Care – PPO

## 2021-10-19 DIAGNOSIS — Z8541 Personal history of malignant neoplasm of cervix uteri: Secondary | ICD-10-CM | POA: Diagnosis not present

## 2021-10-19 DIAGNOSIS — Z85850 Personal history of malignant neoplasm of thyroid: Secondary | ICD-10-CM | POA: Diagnosis not present

## 2021-10-19 DIAGNOSIS — Z79899 Other long term (current) drug therapy: Secondary | ICD-10-CM | POA: Insufficient documentation

## 2021-10-19 DIAGNOSIS — Z923 Personal history of irradiation: Secondary | ICD-10-CM | POA: Diagnosis not present

## 2021-10-19 DIAGNOSIS — Z86000 Personal history of in-situ neoplasm of breast: Secondary | ICD-10-CM | POA: Insufficient documentation

## 2021-10-19 DIAGNOSIS — Z95828 Presence of other vascular implants and grafts: Secondary | ICD-10-CM

## 2021-10-19 DIAGNOSIS — D509 Iron deficiency anemia, unspecified: Secondary | ICD-10-CM | POA: Insufficient documentation

## 2021-10-19 DIAGNOSIS — I471 Supraventricular tachycardia: Secondary | ICD-10-CM | POA: Insufficient documentation

## 2021-10-19 LAB — CBC WITH DIFFERENTIAL (CANCER CENTER ONLY)
Abs Immature Granulocytes: 0.02 10*3/uL (ref 0.00–0.07)
Basophils Absolute: 0.1 10*3/uL (ref 0.0–0.1)
Basophils Relative: 1 %
Eosinophils Absolute: 0.2 10*3/uL (ref 0.0–0.5)
Eosinophils Relative: 2 %
HCT: 37.7 % (ref 36.0–46.0)
Hemoglobin: 11.6 g/dL — ABNORMAL LOW (ref 12.0–15.0)
Immature Granulocytes: 0 %
Lymphocytes Relative: 28 %
Lymphs Abs: 2.4 10*3/uL (ref 0.7–4.0)
MCH: 24.2 pg — ABNORMAL LOW (ref 26.0–34.0)
MCHC: 30.8 g/dL (ref 30.0–36.0)
MCV: 78.7 fL — ABNORMAL LOW (ref 80.0–100.0)
Monocytes Absolute: 0.9 10*3/uL (ref 0.1–1.0)
Monocytes Relative: 10 %
Neutro Abs: 5.1 10*3/uL (ref 1.7–7.7)
Neutrophils Relative %: 59 %
Platelet Count: 374 10*3/uL (ref 150–400)
RBC: 4.79 MIL/uL (ref 3.87–5.11)
RDW: 17.2 % — ABNORMAL HIGH (ref 11.5–15.5)
WBC Count: 8.7 10*3/uL (ref 4.0–10.5)
nRBC: 0 % (ref 0.0–0.2)

## 2021-10-19 LAB — CMP (CANCER CENTER ONLY)
ALT: 30 U/L (ref 0–44)
AST: 19 U/L (ref 15–41)
Albumin: 4.2 g/dL (ref 3.5–5.0)
Alkaline Phosphatase: 86 U/L (ref 38–126)
Anion gap: 10 (ref 5–15)
BUN: 23 mg/dL — ABNORMAL HIGH (ref 6–20)
CO2: 27 mmol/L (ref 22–32)
Calcium: 9.6 mg/dL (ref 8.9–10.3)
Chloride: 103 mmol/L (ref 98–111)
Creatinine: 1.14 mg/dL — ABNORMAL HIGH (ref 0.44–1.00)
GFR, Estimated: 59 mL/min — ABNORMAL LOW (ref >60)
Glucose, Bld: 122 mg/dL — ABNORMAL HIGH (ref 70–99)
Potassium: 3.5 mmol/L (ref 3.5–5.1)
Sodium: 140 mmol/L (ref 135–145)
Total Bilirubin: 0.3 mg/dL (ref 0.3–1.2)
Total Protein: 7.8 g/dL (ref 6.5–8.1)

## 2021-10-19 MED ORDER — SODIUM CHLORIDE 0.9% FLUSH
10.0000 mL | INTRAVENOUS | Status: DC | PRN
  Administered 2021-10-19: 10 mL

## 2021-10-19 MED ORDER — SITAGLIPTIN PHOSPHATE 100 MG PO TABS: 100.0000 mg | ORAL_TABLET | Freq: Every day | ORAL | Status: DC

## 2021-10-19 MED ORDER — HEPARIN SOD (PORK) LOCK FLUSH 100 UNIT/ML IV SOLN
500.0000 [IU] | Freq: Once | INTRAVENOUS | Status: AC | PRN
  Administered 2021-10-19: 500 [IU]

## 2021-10-19 MED ORDER — SYNTHROID 100 MCG PO TABS: 100.0000 ug | ORAL_TABLET | Freq: Every morning | ORAL | Status: DC

## 2021-10-19 NOTE — Progress Notes (Signed)
Pt has indention to port, skin looks like It hasn't healed properly but pt stated it has been like this for months and since she is diabetic, it always looks like this. No signs of infection, or pain to the site. Made Dr. Lindi Adie aware, he said to proceed with accessing port.

## 2021-10-28 DIAGNOSIS — R911 Solitary pulmonary nodule: Secondary | ICD-10-CM | POA: Diagnosis not present

## 2021-10-30 ENCOUNTER — Other Ambulatory Visit (HOSPITAL_COMMUNITY): Payer: Self-pay

## 2021-10-30 ENCOUNTER — Other Ambulatory Visit (HOSPITAL_COMMUNITY): Payer: BC Managed Care – PPO

## 2021-10-30 ENCOUNTER — Other Ambulatory Visit: Payer: Self-pay | Admitting: Hematology and Oncology

## 2021-10-30 MED ORDER — NORMAL SALINE FLUSH 0.9 % IV SOLN
INTRAVENOUS | 1 refills | Status: DC
  Filled 2021-10-30: qty 2400, 15d supply, fill #0
  Filled 2021-11-16: qty 2400, 15d supply, fill #1

## 2021-10-30 MED ORDER — HEPARIN SOD (PORK) LOCK FLUSH 100 UNIT/ML IV SOLN
INTRAVENOUS | 1 refills | Status: DC
  Filled 2021-10-30: qty 150, 30d supply, fill #0
  Filled 2021-11-30: qty 150, 30d supply, fill #1

## 2021-10-31 ENCOUNTER — Other Ambulatory Visit: Payer: Self-pay | Admitting: Hematology and Oncology

## 2021-11-01 ENCOUNTER — Other Ambulatory Visit: Payer: Self-pay

## 2021-11-01 ENCOUNTER — Encounter (HOSPITAL_BASED_OUTPATIENT_CLINIC_OR_DEPARTMENT_OTHER): Payer: Self-pay

## 2021-11-01 ENCOUNTER — Other Ambulatory Visit (HOSPITAL_COMMUNITY): Payer: Self-pay

## 2021-11-01 ENCOUNTER — Emergency Department (HOSPITAL_BASED_OUTPATIENT_CLINIC_OR_DEPARTMENT_OTHER)
Admission: EM | Admit: 2021-11-01 | Discharge: 2021-11-01 | Disposition: A | Discharge status: Home / Self Care | Payer: BC Managed Care – PPO | Attending: Emergency Medicine | Admitting: Emergency Medicine

## 2021-11-01 ENCOUNTER — Emergency Department (HOSPITAL_BASED_OUTPATIENT_CLINIC_OR_DEPARTMENT_OTHER): Payer: BC Managed Care – PPO

## 2021-11-01 DIAGNOSIS — G8929 Other chronic pain: Secondary | ICD-10-CM | POA: Diagnosis not present

## 2021-11-01 DIAGNOSIS — Z7982 Long term (current) use of aspirin: Secondary | ICD-10-CM | POA: Insufficient documentation

## 2021-11-01 DIAGNOSIS — M545 Low back pain, unspecified: Secondary | ICD-10-CM | POA: Insufficient documentation

## 2021-11-01 DIAGNOSIS — M2578 Osteophyte, vertebrae: Secondary | ICD-10-CM | POA: Diagnosis not present

## 2021-11-01 DIAGNOSIS — M5442 Lumbago with sciatica, left side: Secondary | ICD-10-CM | POA: Diagnosis not present

## 2021-11-01 DIAGNOSIS — Z79899 Other long term (current) drug therapy: Secondary | ICD-10-CM | POA: Insufficient documentation

## 2021-11-01 MED ORDER — CYCLOBENZAPRINE HCL 5 MG PO TABS: 5.0000 mg | ORAL_TABLET | Freq: Three times a day (TID) | ORAL | 0 refills | Status: DC | PRN

## 2021-11-01 MED ORDER — HYDROMORPHONE HCL 1 MG/ML IJ SOLN
1.0000 mg | Freq: Once | INTRAMUSCULAR | Status: AC
  Administered 2021-11-01: 1 mg via INTRAVENOUS
  Filled 2021-11-01: qty 1

## 2021-11-01 MED ORDER — DIPHENHYDRAMINE HCL 50 MG/ML IJ SOLN
50.0000 mg | Freq: Once | INTRAMUSCULAR | Status: AC
  Administered 2021-11-01: 50 mg via INTRAVENOUS
  Filled 2021-11-01: qty 1

## 2021-11-01 NOTE — ED Provider Notes (Signed)
[facility] EMERGENCY DEPT Provider Note   CSN: [id number] Arrival date & time: 11/01/21  1752     History  Chief Complaint  Patient presents with   Back Pain    [name] is a 51 y.o. female with a past medical history of chronic left-sided low back pain with left-sided sciatica, spinal stenosis of lumbosacral region, who presents to the ED complaining of back pain onset 4 days.  Denies recent injury, trauma, fall.  Her bilateral lower back pain radiates to her bilateral lower extremities, more so on the left.  She has tried Management consultant, Voltaren gel, Flexeril with no relief of her symptoms.  Denies fever, chills, nausea, vomiting, dysuria, hematuria, bowel/bladder incontinence, cauda equina.  Patient wears oxygen continuously, typically 3-4 L.  She is followed by pulmonologist, Dr. Loanne Drilling.  Per patient chart review: Patient was evaluated by [facility] neurology with her most recent visit being in December 2022 at that time was recommended for an MRI of her lower back due to her chronic left lower back pain with numbness to the left leg.   The history is provided by the patient. No language interpreter was used.      Home Medications Prior to Admission medications   Medication Sig Start Date End Date Taking? Authorizing Provider  albuterol (2.5 MG/3ML) 0.083% NEBU 3 mL, albuterol (5 MG/ML) 0.5% NEBU 0.5 mL Inhale 3 mg into the lungs every 6 (six) hours as needed (SOB).     [provider]  albuterol (PROVENTIL) (2.5 MG/3ML) 0.083% nebulizer solution Take 3 mLs (2.5 mg total) by nebulization every 6 (six) hours as needed for wheezing or shortness of breath. 03/27/21 06/16/21  Magdalen Spatz, NP  albuterol (VENTOLIN HFA) 108 (90 Base) MCG/ACT inhaler Inhale 2 puffs into the lungs every 6 (six) hours as needed for wheezing or shortness of breath.    [provider]  ALPRAZolam Duanne Moron) 0.5 MG tablet Take 0.5 mg by mouth 2 (two) times daily as needed for anxiety.  09/21/20   [provider]  alum & mag hydroxide-simeth (MAALOX/MYLANTA) 200-200-20 MG/5ML suspension Take 30 mLs by mouth daily as needed for indigestion or heartburn. For Bleeding Ulcers    [provider]  ARIPiprazole (ABILIFY) 2 MG tablet Take 2 mg by mouth daily. Patient not taking: Reported on 06/23/2021 06/03/21   [provider]  aspirin 325 MG tablet Take 325 mg by mouth at bedtime.     [provider]  beclomethasone (QVAR) 80 MCG/ACT inhaler Inhale 1 puff into the lungs 2 (two) times daily.     [provider]  beclomethasone (QVAR) 80 MCG/ACT inhaler Inhale 1 puff into the lungs 2 (two) times daily. 04/17/21   Margaretha Seeds, MD  benzonatate (TESSALON) 200 MG capsule Take 1 capsule (200 mg total) by mouth 3 (three) times daily as needed for cough. 04/15/19   Martyn Ehrich, NP  BREO ELLIPTA 200-25 MCG/ACT AEPB TAKE 1 PUFF BY MOUTH EVERY DAY 09/26/21   Margaretha Seeds, MD  bumetanide (BUMEX) 1 MG tablet TAKE 1 TABLET BY MOUTH TWICE A DAY 07/17/21   Nicholas Lose, MD  buPROPion (WELLBUTRIN XL) 150 MG 24 hr tablet TAKE 1 TABLET BY MOUTH EVERY DAY 06/22/21   Mozingo, Berdie Ogren, NP  cyclobenzaprine (FLEXERIL) 5 MG tablet Take 1 tablet (5 mg total) by mouth 3 (three) times daily as needed for muscle spasms. 05/25/21   Nicholas Lose, MD  dexlansoprazole (DEXILANT) 60 MG capsule Take 60 mg  by mouth daily.    [provider]  Diclofenac Potassium,Migraine, (CAMBIA) 50 MG PACK Take 1 packet by mouth as needed for migraine. 05/16/21   [provider]  diphenhydrAMINE (BENADRYL) 50 MG/ML injection Inject 1 mL (50 mg total) into the vein every 6 (six) hours as needed (nausea). 12/25/18   Rai, Vernelle Emerald, MD  doxycycline (VIBRA-TABS) 100 MG tablet Take 1 tablet (100 mg total) by mouth 2 (two) times daily. 03/27/21   Magdalen Spatz, NP  dronabinol (MARINOL) 2.5 MG capsule Take 1 capsule (2.5 mg total) by mouth 2 (two) times daily  before lunch and supper. 06/12/21   Nicholas Lose, MD  EPINEPHrine 0.3 mg/0.3 mL IJ SOAJ injection Inject 0.3 mg into the muscle as needed for anaphylaxis. 06/13/21   Margaretha Seeds, MD  escitalopram (LEXAPRO) 20 MG tablet Take 1 tablet (20 mg total) by mouth every evening. 01/23/21   Nicholas Lose, MD  Galcanezumab-gnlm 120 MG/ML SOAJ Inject 120 mg into the skin every 28 (twenty-eight) days.  03/31/19   [provider]  heparin lock flush 100 UNIT/ML SOLN injection USE 5 MLS AS A HEPLOCK IN PORT A CATH ONCE DAILY AFTER MEDICATION ADMINISTRATION OR AS DIRECTED BY PHYSICIAN. 10/30/21 10/30/22  Nicholas Lose, MD  hydrocortisone (CORTEF) 10 MG tablet Take 1-2 tablets (10-20 mg total) by mouth See admin instructions. Take 20 mg in the am and 48m in the evening Patient taking differently: Take 10-20 mg by mouth See admin instructions. Take 20 mg by mouth in the morning and 181min the evening 02/17/20   KyMarylyn IshiharaTyrone A, DO  Lancets (ONETOUCH DELICA PLUS [id number] 3 (three) times daily. for testing 06/12/21   [provider]  levothyroxine (SYNTHROID, LEVOTHROID) 112 MCG tablet Take 112 mcg by mouth daily before breakfast.    [provider]  LORazepam (ATIVAN) 0.5 MG tablet TAKE 1 TABLET BY MOUTH 2 TIMES DAILY AS NEEDED FOR ANXIETY. 09/15/21   GuNicholas LoseMD  metoprolol succinate (TOPROL-XL) 25 MG 24 hr tablet Take 37.5 mg by mouth daily.     [provider]  [facility] test strip 3 (three) times daily. for testing 06/12/21   [provider]  OXYGEN Inhale 3-4 L into the lungs continuous.    [provider]  potassium chloride (KLOR-CON) 10 MEQ tablet Take 1 tablet (10 mEq total) by mouth daily for 21 days. 07/15/20 08/05/20  PaAlfredia ClientPA-C  potassium chloride (KLOR-CON) 10 MEQ tablet Take 10 mEq by mouth daily as needed (low potassium).    [provider]  predniSONE (DELTASONE) 10 MG tablet Take 4 tabs x 2 days, then 3 x 2d, then 2  x 2d, then 1 x 2d and STOP 03/27/21   GrMagdalen SpatzNP  Rimegepant Sulfate (NURTEC) 75 MG TBDP Take 75 mg by mouth daily as needed (Migraine).     [provider]  rosuvastatin (CRESTOR) 10 MG tablet Take 10 mg by mouth daily.  02/28/18   [provider]  silver sulfADIAZINE (SILVADENE) 1 % cream Apply 1 application topically daily as needed (Open Wound).  11/16/19   [provider]  sitaGLIPtin (JANUVIA) 100 MG tablet Take 1 tablet (100 mg total) by mouth daily. 10/19/21   GuNicholas LoseMD  Sodium Chloride Flush (NORMAL SALINE FLUSH) 0.9 % SOLN FLUSH WITH TWO 10 ML FLUSHES IN PORT BEFORE AND AFTER FLUIDS AND MEDICATION ADMINISTRATION, TO MAINTAIN PATENCY--USE UP TO 4 TIMES DAILY. 10/30/21 10/30/22  GuNicholas Lose  MD  sucralfate (CARAFATE) 1 g tablet Take 1 g by mouth daily as needed (FOR ULCERS).     [provider]  SYNTHROID 100 MCG tablet Take 1 tablet (100 mcg total) by mouth every morning. 10/19/21   Nicholas Lose, MD  topiramate (TOPAMAX) 50 MG tablet Take 150 mg by mouth at bedtime.  12/25/19   [provider]  zolpidem (AMBIEN CR) 12.5 MG CR tablet Take 12.5 mg by mouth at bedtime.    [provider]      Allergies    Ferrous bisglycinate chelate [iron], Mushroom extract complex, Na ferric gluc cplx in sucrose, Cymbalta [duloxetine hcl], Succinylcholine, Buprenorphine hcl, Compazine, Metoclopramide, Morphine and related, Ondansetron, Promethazine hcl, and Tegaderm ag mesh [silver]    Review of Systems   Review of Systems  Constitutional:  Negative for chills and fever.  Gastrointestinal:  Negative for abdominal pain, nausea and vomiting.  Genitourinary:  Negative for dysuria and hematuria.  Musculoskeletal:  Positive for back pain.  Skin:  Negative for rash.  All other systems reviewed and are negative.  Physical Exam Updated Vital Signs BP 129/71 (BP Location: Left Arm)    Pulse (!) 106    Temp 99.1 F (37.3 C)    Resp 16    Ht 5'  6" (1.676 m)    Wt 78.5 kg    LMP  (LMP Unknown)    SpO2 100%    BMI 27.92 kg/m  Physical Exam Vitals and nursing note reviewed.  Constitutional:      General: She is not in acute distress.    Appearance: She is not diaphoretic.  HENT:     Head: Normocephalic and atraumatic.     Mouth/Throat:     Pharynx: No oropharyngeal exudate.  Eyes:     General: No scleral icterus.    Conjunctiva/sclera: Conjunctivae normal.  Cardiovascular:     Rate and Rhythm: Normal rate and regular rhythm.     Pulses: Normal pulses.     Heart sounds: Normal heart sounds.  Pulmonary:     Effort: Pulmonary effort is normal. No respiratory distress.     Breath sounds: Normal breath sounds. No wheezing.  Abdominal:     General: Bowel sounds are normal.     Palpations: Abdomen is soft. There is no mass.     Tenderness: There is no abdominal tenderness. There is no guarding or rebound.  Musculoskeletal:     Cervical back: Normal, normal range of motion and neck supple.     Thoracic back: Normal.     Lumbar back: Tenderness present. No bony tenderness. Positive left straight leg raise test. Negative right straight leg raise test.     Comments: No C, T, L, S spinal tenderness to palpation. Tenderness to palpation noted to bilateral lumbar musculature.  No overlying skin changes.  Positive straight leg raise noted to the left.  Negative right straight leg raise.  Skin:    General: Skin is warm and dry.  Neurological:     Mental Status: She is alert.  Psychiatric:        Behavior: Behavior normal.    ED Results / Procedures / Treatments   Labs (all labs ordered are listed, but only abnormal results are displayed) Labs Reviewed - No data to display  EKG None  Radiology No results found.  Procedures Procedures    Medications Ordered in ED Medications - No data to display  ED Course/ Medical Decision Making/ A&P Clinical Course as of 11/02/21  [place] Nov 01, 2021  2044 Discussed with patient  treatment plan.  Patient agreeable at this time.  Discussed discharge treatment plan of Flexeril, Voltaren gel, Salonpas.  Patient agreeable at this time.  Discussed with patient to make sure to maintain follow-up with neurologist. [SB]  2136 Reevaluated and noted improvement with treatment regimen. Pt appears safe for discharge [SB]    Clinical Course User Index [SB] Tahesha Skeet A, PA-C                           Medical Decision Making Amount and/or Complexity of Data Reviewed Radiology: ordered.  Risk Prescription drug management.   Patient with chronic lumbar back pain with radiation down left leg.  Patient previously evaluated for similar symptoms by her neurologist who at that time recommended MRI imaging which patient is in the process of scheduling.  No loss of bowel or bladder control.  No concern for cauda equina.  Patient with an extensive history of malignancy, breast cancer, cervical cancer, thyroid cancer.  No urinary symptoms suggestive of UTI.  On exam, patient with no spinal tenderness to palpation.  Patient has tenderness to palpation noted to lumbar musculature on the left.  Positive straight leg raise on the left.  Negative right straight leg raise.  Differential diagnosis includes fracture, herniation, muscle strain, sciatica, malignancy.   CT lumbar spine obtained and showed negative for acute fracture or dislocation or herniation.  Patient given Dilaudid and Benadryl in the ED with relief of her symptoms.  Patient presentation suspicious for sciatica on the left due to patient presentation.  Doubt fracture, herniation, muscle strain, malignancy or dislocation at this time.  Detailed conversation with patient regarding calling neurologist office to set up follow-up appointment regarding today's ED visit.  During ED visit today, patient sent a message to her neurologist via MyChart to set up a follow-up appointment.  Prescription sent for Flexeril, as patient is out of this  medication.  Instructed patient on use of Voltaren gel that she has at home to aid with her symptoms.  Also discussed that patient can continue with Salonpas and to alternate with Voltaren gel to aid in relieving her symptoms.  Instructed patient on warm compresses to aid with symptoms.  Supportive care measures and strict return precautions discussed with patient at bedside.  Patient acknowledges and verbalized understanding.  Patient appears safe for discharge.  Follow-up as indicated discharge for work.     This chart was dictated using voice recognition software, Dragon. Despite the best efforts of this provider to proofread and correct errors, errors may still occur which can change documentation meaning.  Final Clinical Impression(s) / ED Diagnoses Final diagnoses:  None    Rx / DC Orders ED Discharge Orders     None         Wilbern Pennypacker A, PA-C 11/02/21 0054    Isla Pence, MD 11/03/21 1811

## 2021-11-01 NOTE — ED Triage Notes (Signed)
Pt has hx of back pain, worsened on Sunday and has increasingly became worse.  C/o bilateral hip pain and left leg. Brought into triage via wc

## 2021-11-01 NOTE — Discharge Instructions (Addendum)
It was a pleasure taking care of you today!   Your CT was negative for fracture. You are prescribed Robaxin, Voltaren gel, and a prednisone taper.  Take medications as prescribed.  Do not operate any heavy machinery or drive while taking Robaxin.  You may apply ice or heat to the affected area for up to 15 minutes at a time.  Ensure to place a barrier between your skin and the ice or heat. Return to the Emergency Department if you are experiencing loss of bowel or bladder, increasing/worsening symptoms, fever, inability to walk.

## 2021-11-03 ENCOUNTER — Other Ambulatory Visit (HOSPITAL_COMMUNITY): Payer: Self-pay

## 2021-11-07 ENCOUNTER — Other Ambulatory Visit: Payer: Self-pay | Admitting: *Deleted

## 2021-11-07 ENCOUNTER — Encounter: Payer: Self-pay | Admitting: Hematology and Oncology

## 2021-11-07 DIAGNOSIS — C8198 Hodgkin lymphoma, unspecified, lymph nodes of multiple sites: Secondary | ICD-10-CM

## 2021-11-07 DIAGNOSIS — Z85850 Personal history of malignant neoplasm of thyroid: Secondary | ICD-10-CM

## 2021-11-07 DIAGNOSIS — Z86000 Personal history of in-situ neoplasm of breast: Secondary | ICD-10-CM

## 2021-11-08 ENCOUNTER — Ambulatory Visit (INDEPENDENT_AMBULATORY_CARE_PROVIDER_SITE_OTHER): Payer: Medicare Other | Admitting: Adult Health

## 2021-11-08 ENCOUNTER — Telehealth: Payer: Self-pay

## 2021-11-08 ENCOUNTER — Encounter: Payer: Self-pay | Admitting: Hematology and Oncology

## 2021-11-08 ENCOUNTER — Other Ambulatory Visit: Payer: Self-pay

## 2021-11-08 ENCOUNTER — Encounter: Payer: Self-pay | Admitting: Adult Health

## 2021-11-08 ENCOUNTER — Other Ambulatory Visit (HOSPITAL_COMMUNITY): Payer: Self-pay

## 2021-11-08 DIAGNOSIS — G4700 Insomnia, unspecified: Secondary | ICD-10-CM | POA: Diagnosis not present

## 2021-11-08 DIAGNOSIS — F411 Generalized anxiety disorder: Secondary | ICD-10-CM | POA: Diagnosis not present

## 2021-11-08 DIAGNOSIS — F410 Panic disorder [episodic paroxysmal anxiety] without agoraphobia: Secondary | ICD-10-CM

## 2021-11-08 DIAGNOSIS — F331 Major depressive disorder, recurrent, moderate: Secondary | ICD-10-CM | POA: Diagnosis not present

## 2021-11-08 MED ORDER — BUPROPION HCL ER (XL) 150 MG PO TB24: 150.0000 mg | ORAL_TABLET | Freq: Every day | ORAL | 1 refills | Status: DC

## 2021-11-08 MED ORDER — ALPRAZOLAM 0.5 MG PO TABS: 0.5000 mg | ORAL_TABLET | Freq: Two times a day (BID) | ORAL | 2 refills | Status: DC | PRN

## 2021-11-08 MED ORDER — LAMOTRIGINE 25 MG PO TABS: ORAL_TABLET | ORAL | 2 refills | Status: DC

## 2021-11-08 MED ORDER — ESCITALOPRAM OXALATE 20 MG PO TABS: 20.0000 mg | ORAL_TABLET | Freq: Every evening | ORAL | 3 refills | Status: DC

## 2021-11-08 NOTE — Telephone Encounter (Signed)
Return call to pt, pt states she has seen fluid coming from her port site this afternoon, pt states that she puts benadryl in her port herself (cannot confirm who suggested or prescribed this), denies fever, redness or warmth, states the area that has been accessed is not healing.  Pt was challenging to understand as she was bouncing around to different topics, slurring words, and stating several times that she has 'upset issues'. I advised pt to go to the ED, pt expressed disappointment with this recommendation.  I strongly encouraged her to go as this would be the fastest way to get help, educated pt that this port issue could require workup and treatment that we do not have access to here at the cancer center.  Pt states she does not have a PCP to see or call today.  After reviewing the pros and cons of this recommendation the pt did decide and agree to seek care at ED.  I agreed to call the ED to inform them of her arrival and history.  I advised the pt to not drive herself to the ED as she sounded confused and disoriented.  Pt verbalized understanding and thanks. I did call [facility] ED charge for handoff

## 2021-11-08 NOTE — Progress Notes (Signed)
[name] [id number] 1970-11-20 51 y.o.  Subjective:   Patient ID:  [name] is a 51 y.o. (DOB July 11, 1971) female.  Chief Complaint: No chief complaint on file.   HPI [name] presents to the office today for follow-up of MDD, GAD, insomnia, and panic attacks.  Describes mood today as "better". Pleasant. Tearful at times. Mood symptoms - reports depression, anxiety, and irritability. Feels like she may have a mood disorder. Reporting "fluctuations". Reports panic attacks. Feels like medications have helped to balance her but she is "sliding" backwards. Fighting cancer since age 2 - Hodgkins stage 97. Also diagnosed  with multiple medical issues. Varying interest and motivation. Taking medications as prescribed.  Energy levels low. Active, does not have a regular exercise routine with current physical disabilities. Enjoys some usual interests and activities. Married. Lives with husband. Has a 15 year old daughter and a 76 year old son. Spending time with family.  Appetite decreased. Weight loss 16 pounds.  Sleeps well most nights. Averages 8 hours with Ambien. Reports some daytime napping. Focus and concentration stable. Completing tasks. Managing aspects of household. Disabled in 2016 - [employer]. Denies SI or HI.  Denies AH or VH.  Previous medication trials: Wellbutrin - mean, Zoloft-not helpful   Flowsheet Row ED from 11/01/2021 in [facility] ED to Hosp-Admission (Discharged) from 12/08/2020 in [facility] Admission (Discharged) from 12/05/2020 in [facility] No Risk No Risk No Risk        Review of Systems:  Review of Systems  Musculoskeletal:  Negative for gait problem.  Neurological:  Negative for tremors.  Psychiatric/Behavioral:         Please refer to HPI   Medications: I have reviewed the patient's current medications.  Current Outpatient Medications   Medication Sig Dispense Refill   lamoTRIgine (LAMICTAL) 25 MG tablet Take one tablet every night at bedtime for 2 weeks, then increase to two tablets at bedtime. 60 tablet 2   albuterol (2.5 MG/3ML) 0.083% NEBU 3 mL, albuterol (5 MG/ML) 0.5% NEBU 0.5 mL Inhale 3 mg into the lungs every 6 (six) hours as needed (SOB).      albuterol (PROVENTIL) (2.5 MG/3ML) 0.083% nebulizer solution Take 3 mLs (2.5 mg total) by nebulization every 6 (six) hours as needed for wheezing or shortness of breath. 75 mL 12   albuterol (VENTOLIN HFA) 108 (90 Base) MCG/ACT inhaler Inhale 2 puffs into the lungs every 6 (six) hours as needed for wheezing or shortness of breath.     ALPRAZolam (XANAX) 0.5 MG tablet Take 1 tablet (0.5 mg total) by mouth 2 (two) times daily as needed for anxiety. 60 tablet 2   alum & mag hydroxide-simeth (MAALOX/MYLANTA) 200-200-20 MG/5ML suspension Take 30 mLs by mouth daily as needed for indigestion or heartburn. For Bleeding Ulcers     aspirin 325 MG tablet Take 325 mg by mouth at bedtime.      beclomethasone (QVAR) 80 MCG/ACT inhaler Inhale 1 puff into the lungs 2 (two) times daily.      beclomethasone (QVAR) 80 MCG/ACT inhaler Inhale 1 puff into the lungs 2 (two) times daily. 1 each 6   benzonatate (TESSALON) 200 MG capsule Take 1 capsule (200 mg total) by mouth 3 (three) times daily as needed for cough. 45 capsule 1   BREO ELLIPTA 200-25 MCG/ACT AEPB TAKE 1 PUFF BY MOUTH EVERY DAY 60 each 3   bumetanide (BUMEX) 1  MG tablet TAKE 1 TABLET BY MOUTH TWICE A DAY 180 tablet 1   buPROPion (WELLBUTRIN XL) 150 MG 24 hr tablet Take 1 tablet (150 mg total) by mouth daily. 90 tablet 1   cyclobenzaprine (FLEXERIL) 5 MG tablet Take 1 tablet (5 mg total) by mouth 3 (three) times daily as needed for muscle spasms. 30 tablet 0   dexlansoprazole (DEXILANT) 60 MG capsule Take 60 mg by mouth daily.     Diclofenac Potassium,Migraine, (CAMBIA) 50 MG PACK Take 1 packet by mouth as needed for migraine.      diphenhydrAMINE (BENADRYL) 50 MG/ML injection Inject 1 mL (50 mg total) into the vein every 6 (six) hours as needed (nausea). 25 mL 0   doxycycline (VIBRA-TABS) 100 MG tablet Take 1 tablet (100 mg total) by mouth 2 (two) times daily. 14 tablet 0   dronabinol (MARINOL) 2.5 MG capsule Take 1 capsule (2.5 mg total) by mouth 2 (two) times daily before lunch and supper. 30 capsule 3   EPINEPHrine 0.3 mg/0.3 mL IJ SOAJ injection Inject 0.3 mg into the muscle as needed for anaphylaxis. 2 each 2   escitalopram (LEXAPRO) 20 MG tablet Take 1 tablet (20 mg total) by mouth every evening. 90 tablet 3   Galcanezumab-gnlm 120 MG/ML SOAJ Inject 120 mg into the skin every 28 (twenty-eight) days.      heparin lock flush 100 UNIT/ML SOLN injection USE 5 MLS AS A HEPLOCK IN PORT A CATH ONCE DAILY AFTER MEDICATION ADMINISTRATION OR AS DIRECTED BY PHYSICIAN. 150 mL 1   hydrocortisone (CORTEF) 10 MG tablet Take 1-2 tablets (10-20 mg total) by mouth See admin instructions. Take 20 mg in the am and 52m in the evening (Patient taking differently: Take 10-20 mg by mouth See admin instructions. Take 20 mg by mouth in the morning and 186min the evening)     Lancets (ONETOUCH DELICA PLUS [id number] 3 (three) times daily. for testing     levothyroxine (SYNTHROID, LEVOTHROID) 112 MCG tablet Take 112 mcg by mouth daily before breakfast.     metoprolol succinate (TOPROL-XL) 25 MG 24 hr tablet Take 37.5 mg by mouth daily.      ONETOUCH VERIO test strip 3 (three) times daily. for testing     OXYGEN Inhale 3-4 L into the lungs continuous.     potassium chloride (KLOR-CON) 10 MEQ tablet Take 1 tablet (10 mEq total) by mouth daily for 21 days. 21 tablet 0   potassium chloride (KLOR-CON) 10 MEQ tablet Take 10 mEq by mouth daily as needed (low potassium).     predniSONE (DELTASONE) 10 MG tablet Take 4 tabs x 2 days, then 3 x 2d, then 2 x 2d, then 1 x 2d and STOP 20 tablet 0   Rimegepant Sulfate (NURTEC) 75 MG TBDP Take 75 mg by mouth  daily as needed (Migraine).      rosuvastatin (CRESTOR) 10 MG tablet Take 10 mg by mouth daily.      silver sulfADIAZINE (SILVADENE) 1 % cream Apply 1 application topically daily as needed (Open Wound).      sitaGLIPtin (JANUVIA) 100 MG tablet Take 1 tablet (100 mg total) by mouth daily.     Sodium Chloride Flush (NORMAL SALINE FLUSH) 0.9 % SOLN FLUSH WITH TWO 10 ML FLUSHES IN PORT BEFORE AND AFTER FLUIDS AND MEDICATION ADMINISTRATION, TO MAINTAIN PATENCY--USE UP TO 4 TIMES DAILY. 2400 mL 1   sucralfate (CARAFATE) 1 g tablet Take 1 g by mouth daily as needed (FOR ULCERS).  SYNTHROID 100 MCG tablet Take 1 tablet (100 mcg total) by mouth every morning.     topiramate (TOPAMAX) 50 MG tablet Take 150 mg by mouth at bedtime.      Current Facility-Administered Medications  Medication Dose Route Frequency Provider Last Rate Last Admin   0.9 %  sodium chloride infusion   Intravenous PRN Margaretha Seeds, MD       Mepolizumab SOLR 100 mg  100 mg Subcutaneous Q28 days Margaretha Seeds, MD   100 mg at 08/31/19 1017    Medication Side Effects: None  Allergies:  Allergies  Allergen Reactions   Ferrous Bisglycinate Chelate [Iron] Anaphylaxis    Only IV    Mushroom Extract Complex Anaphylaxis   Na Ferric Gluc Cplx In Sucrose Anaphylaxis   Cymbalta [Duloxetine Hcl] Swelling and Anxiety   Phenergan [Promethazine]     Other reaction(s): Unknown   Succinylcholine Other (See Comments)    Lock Jaw   Buprenorphine Hcl Hives   Compazine Other (See Comments)    Altered mental status Aggression   Metoclopramide Other (See Comments)    Dystonia   Morphine And Related Hives   Ondansetron Hives and Rash    Other reaction(s): Unknown   Promethazine Hcl Hives   Tegaderm Ag Mesh [Silver] Rash    Old formulation only, is able to tolerate new formulation    Past Medical History:  Diagnosis Date   Addison's disease ([place])    Adrenal insufficiency (HCC)    Anemia    Anxiety    Aortic stenosis     Aortic stenosis    Appendicitis 12/19/09   Appendicitis    Breast cancer (HCC)    STATUS POST BILATERAL MASTECTOMY. STATUS POST RECONSTRUCTION. SHE HAD SILICONE BREAST IMPLANTS AND THE LEFT IMPLANT IS LEAKING SLIGHTLY   Cellulitis of right middle finger 11/07/2018   Cervical cancer ([place]) 12/23/2018   Chest pain    CHF with right heart failure ([place]) 04/17/2017   Chronic respiratory failure with hypoxia (HCC) 12/23/2018   Cough variant asthma 04/13/2019   Depression    GERD (gastroesophageal reflux disease)    takes Dexilant and carafate and gi coctail    Headache    migraines on a daily and monthly regimen    Heart murmur    History of kidney stones    Hodgkin lymphoma ([place])    STATUS POST MANTLE RADIATION   Hodgkin's lymphoma ([place])    1987   Hypertension    Hypoxia    Necrotizing fasciitis ([place]) 12/23/2018   Non-ischemic cardiomyopathy ([place])    Osteoporosis    Palpitations    Pituitary adenoma ([place]) 12/23/2018   Pneumonia    PONV (postoperative nausea and vomiting)    Pre-diabetes    per pt; no meds   Pulmonary hypertension ([place]) 12/23/2018   Raynaud phenomenon    Right heart failure ([place]) 04/17/2017   Seizures ([place])    last febrile seizure was approx 3 weeks ago per report on 12/01/2020   Sleep apnea    upcoming sleep study per pt    Supplemental oxygen dependent    3 liters   SVT (supraventricular tachycardia) (HCC)    Tachycardia    Thyroid cancer ([place])    STATUS POST SURGICAL REMOVAL-CURRENT ON THYROID REPLACEMENT    Past Medical History, Surgical history, Social history, and Family history were reviewed and updated as appropriate.   Please see review of systems for further details on the patient's review from today.   Objective:  Physical Exam:  LMP  (LMP Unknown)   Physical Exam Constitutional:      General: She is not in acute distress. Musculoskeletal:        General: No deformity.  Neurological:     Mental Status: She is alert and oriented to person, place,  and time.     Coordination: Coordination normal.  Psychiatric:        Attention and Perception: Attention and perception normal. She does not perceive auditory or visual hallucinations.        Mood and Affect: Mood normal. Mood is not anxious or depressed. Affect is not labile, blunt, angry or inappropriate.        Speech: Speech normal.        Behavior: Behavior normal.        Thought Content: Thought content normal. Thought content is not paranoid or delusional. Thought content does not include homicidal or suicidal ideation. Thought content does not include homicidal or suicidal plan.        Cognition and Memory: Cognition and memory normal.        Judgment: Judgment normal.     Comments: Insight intact    Lab Review:     Component Value Date/Time   NA 140 10/19/2021 0947   NA 141 04/01/2018 1617   K 3.5 10/19/2021 0947   CL 103 10/19/2021 0947   CO2 27 10/19/2021 0947   GLUCOSE 122 (H) 10/19/2021 0947   BUN 23 (H) 10/19/2021 0947   BUN 11 04/01/2018 1617   CREATININE 1.14 (H) 10/19/2021 0947   CALCIUM 9.6 10/19/2021 0947   PROT 7.8 10/19/2021 0947   PROT 7.1 01/23/2018 1225   ALBUMIN 4.2 10/19/2021 0947   ALBUMIN 4.4 01/23/2018 1225   AST 19 10/19/2021 0947   ALT 30 10/19/2021 0947   ALKPHOS 86 10/19/2021 0947   BILITOT 0.3 10/19/2021 0947   GFRNONAA 59 (L) 10/19/2021 0947   GFRAA >60 04/27/2020 0613   GFRAA >60 02/10/2020 1020       Component Value Date/Time   WBC 8.7 10/19/2021 0947   WBC 8.5 12/09/2020 0530   RBC 4.79 10/19/2021 0947   HGB 11.6 (L) 10/19/2021 0947   HGB 12.0 12/21/2019 1418   HCT 37.7 10/19/2021 0947   HCT 39.9 04/01/2018 1617   PLT 374 10/19/2021 0947   PLT 327 04/01/2018 1617   MCV 78.7 (L) 10/19/2021 0947   MCV 89 04/01/2018 1617   MCH 24.2 (L) 10/19/2021 0947   MCHC 30.8 10/19/2021 0947   RDW 17.2 (H) 10/19/2021 0947   RDW 13.6 04/01/2018 1617   LYMPHSABS 2.4 10/19/2021 0947   MONOABS 0.9 10/19/2021 0947   EOSABS 0.2 10/19/2021  0947   BASOSABS 0.1 10/19/2021 0947    No results found for: POCLITH, LITHIUM   No results found for: PHENYTOIN, PHENOBARB, VALPROATE, CBMZ   .res Assessment: Plan:    Plan:  PDMP reviewed  Lexapro 65m daily Xanax 0.573mBID Wellbutrin XL 1502mvery morning  Add Lamictal 61m50m hs x 14 days, then increase to 50mg34mly  Discussed potential benefits, risk, and side effects of benzodiazepines to include potential risk of tolerance and dependence, as well as possible drowsiness.  Advised patient not to drive if experiencing drowsiness and to take lowest possible effective dose to minimize risk of dependence and tolerance.   Counseled patient regarding potential benefits, risks, and side effects of Lamictal to include potential risk of Stevens-Johnson syndrome. Advised patient to stop taking Lamictal and contact  office immediately if rash develops and to seek urgent medical attention if rash is severe and/or spreading quickly. Set up with therapist  RTC 4 weeks  Patient advised to contact office with any questions, adverse effects, or acute worsening in signs and symptoms.  Diagnoses and all orders for this visit:  Generalized anxiety disorder  Major depressive disorder, recurrent episode, moderate (HCC) -     lamoTRIgine (LAMICTAL) 25 MG tablet; Take one tablet every night at bedtime for 2 weeks, then increase to two tablets at bedtime. -     buPROPion (WELLBUTRIN XL) 150 MG 24 hr tablet; Take 1 tablet (150 mg total) by mouth daily. -     escitalopram (LEXAPRO) 20 MG tablet; Take 1 tablet (20 mg total) by mouth every evening. -     ALPRAZolam (XANAX) 0.5 MG tablet; Take 1 tablet (0.5 mg total) by mouth 2 (two) times daily as needed for anxiety.  Insomnia, unspecified type  Panic attacks     Please see After Visit Summary for patient specific instructions.  Future Appointments  Date Time Provider [place]  11/15/2021  2:00 PM [facility] 2 [facility] [place]   11/17/2021  8:45 AM Nicholas Lose, MD [facility] None  11/29/2021  1:00 PM Madeeha Costantino, Berdie Ogren, NP [facility] None  04/19/2022 11:00 AM [facility] None  04/19/2022 11:30 AM Nicholas Lose, MD [facility] None    No orders of the defined types were placed in this encounter.   -------------------------------

## 2021-11-08 NOTE — ED Notes (Signed)
Per the cancer center-patient called oncologist/nurse stating her port had some purulent drainage-patient accesses port herself while home-apparently she has been injection Benadryl-per cancer center nurse she was slurring her speech and making little sense-patient was advised to have someone drive her to ED for evaluation

## 2021-11-10 ENCOUNTER — Other Ambulatory Visit (HOSPITAL_COMMUNITY): Payer: Self-pay

## 2021-11-14 ENCOUNTER — Encounter: Payer: Self-pay | Admitting: *Deleted

## 2021-11-14 ENCOUNTER — Telehealth: Payer: Self-pay

## 2021-11-14 NOTE — Progress Notes (Signed)
Pt presented to our lobby today requesting RN to evaluate port site due to clear drainage from site.  On assessment, port a cath site dark grey/black in color with visible puncture hole. Pt denies fever at this time.  Pt educated to go to the Emergency Department ASAP for further evaluation of possible infection.  Pt declined going to the emergency department and states site has been in this condition for several weeks. RN again encouraged pt be seen in the ED.  Pt continues to decline and states she would prefer to wait until MD f/u later this week. RN encouraged pt to go to the ED ASAP if she develops a fever. Pt verbalized understanding.  Port a cath placed by Dr. Baxter Flattery.  RN placed call to Jinny Blossom, RN with Dr. Baxter Flattery regarding integrity of port a cath.  RN states she will review with Dr. Baxter Flattery and update our office.

## 2021-11-14 NOTE — Telephone Encounter (Addendum)
Received call from Merleen Nicely, Therapist, sports at [facility]. She states the patient comes to them to have her port flushed, but that the site now appears to be black and necrotic with a "permanent hole" visible.   The patient called the cancer center with complaints that her port was seeping, she was advised to go to the ED, but the patient declined.   The patient reported to Merleen Nicely that she uses IV benadryl.    Beryle Flock, RN

## 2021-11-15 ENCOUNTER — Encounter: Payer: Self-pay | Admitting: Hematology and Oncology

## 2021-11-15 ENCOUNTER — Ambulatory Visit (HOSPITAL_COMMUNITY): Payer: BC Managed Care – PPO

## 2021-11-15 ENCOUNTER — Telehealth: Payer: Self-pay

## 2021-11-15 NOTE — Telephone Encounter (Signed)
Per Dr. Baxter Flattery, okay for patient to hold off on CT as it is just routine. Dr. Baxter Flattery has been in contact with Dr. Lindi Adie and states they will try and get the patient an appointment before 2/17.   Relayed this information to the patient. Advised that if her symptoms worsen or if she develops fever or chills she should go to the ED. Patient verbalized understanding and has no further questions.   Recommended she follow up with Dr. Geralyn Flash office in regards to her appointment.   Beryle Flock, RN

## 2021-11-15 NOTE — Telephone Encounter (Signed)
Patient called, says she spoke to Dr. Baxter Flattery yesterday to discuss a direct admit. Patient would also like to know if she can hold off on the CT scan she has scheduled today as she would prefer that no one interfere with her port until it is evaluated by Dr. Baxter Flattery. Message sent to Dr. Baxter Flattery, awaiting response.   Susan Flock, RN

## 2021-11-15 NOTE — Telephone Encounter (Signed)
Return call to pt, pt states she is worried and wants to know what the plan is for her and her port.  Pt states that the black/grey area is now 'completely gone' and she has kept the area covered.  Pt says she talked to Dr Baxter Flattery who advised her it was ok to cancel her CT scan, also discussed direct admit by Dr Baxter Flattery.  Pt agrees to keep her appointment with Dr Lindi Adie for Friday and at that time we will finalize the best plan for her and her port related concerns.  Pt is eager to know what the plan is while we are on the phone (direct admit, port removal, IV abx); however, I informed pt that she will need to be patient and await information from her medical providers after their assessment and reviews have been completed.  Dr. Lindi Adie and Dr. Baxter Flattery have been in touch with one another and we have assured her we are working together to help her.  If pt wants or needs more prompt medical assistance she should seek care at the ED.  Pt verbalized understanding and thanks.

## 2021-11-16 ENCOUNTER — Other Ambulatory Visit (HOSPITAL_COMMUNITY): Payer: Self-pay

## 2021-11-16 ENCOUNTER — Encounter: Payer: Self-pay | Admitting: Internal Medicine

## 2021-11-16 ENCOUNTER — Ambulatory Visit (INDEPENDENT_AMBULATORY_CARE_PROVIDER_SITE_OTHER): Payer: BC Managed Care – PPO | Admitting: Internal Medicine

## 2021-11-16 ENCOUNTER — Other Ambulatory Visit: Payer: Self-pay

## 2021-11-16 VITALS — BP 116/75 | HR 101 | Temp 98.8°F | Ht 64.0 in | Wt 187.0 lb

## 2021-11-16 DIAGNOSIS — Z9889 Other specified postprocedural states: Secondary | ICD-10-CM | POA: Diagnosis not present
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
## 2021-11-16 NOTE — Progress Notes (Signed)
Patient Care Team: Nicholas Lose, MD as PCP - General (Hematology and Oncology) Nahser, Wonda Cheng, MD as PCP - Cardiology (Cardiology) Maylon Peppers, MD (Family Medicine)  DIAGNOSIS:    ICD-10-CM   1. Hx of Hodgkins lymphoma  Z85.71 CBC with Differential (Cancer Center Only)    CMP ([place] only)    Culture, blood (single) w Reflex to ID Panel    Culture, AFB      SUMMARY OF ONCOLOGIC HISTORY: Oncology History  Hx of Hodgkins lymphoma   Initial Diagnosis   Hx of Hodgkins lymphoma    Radiation Therapy      History of thyroid cancer   Initial Diagnosis   Thyroid cancer (HCC)    Remission       Remission       Radiation Therapy      History of cervical cancer  12/23/2018 Initial Diagnosis   History of cervical cancer    Remission       Radiation Therapy        CHIEF COMPLIANT: Follow-up of port related issues  INTERVAL HISTORY: [name] is a 51 y.o. 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 She presents to the clinic today for follow-up because of recent issues related to her port.  She was seen by Dr. Graylon Good yesterday and she did not feel like there is any evidence of infection and hand support can remain.  She wanted [place] to get blood work for cultures along with CBC with differential.  She reports no pain or discomfort of the port.  No redness or swelling.  There is a small opening below the port which does not seem to communicate with anything deeper.  It is quite superficial and has a scab.  ALLERGIES:  is allergic to ferrous bisglycinate chelate [iron], mushroom extract complex, na ferric gluc cplx in sucrose, cymbalta [duloxetine hcl], phenergan [promethazine], succinylcholine, buprenorphine hcl, compazine, metoclopramide, morphine and related, ondansetron, promethazine hcl, and tegaderm ag mesh [silver].  MEDICATIONS:  Current Outpatient Medications   Medication Sig Dispense Refill   albuterol (2.5 MG/3ML) 0.083% NEBU 3 mL, albuterol (5 MG/ML) 0.5% NEBU 0.5 mL Inhale 3 mg into the lungs every 6 (six) hours as needed (SOB).      albuterol (PROVENTIL) (2.5 MG/3ML) 0.083% nebulizer solution Take 3 mLs (2.5 mg total) by nebulization every 6 (six) hours as needed for wheezing or shortness of breath. 75 mL 12   albuterol (VENTOLIN HFA) 108 (90 Base) MCG/ACT inhaler Inhale 2 puffs into the lungs every 6 (six) hours as needed for wheezing or shortness of breath.     ALPRAZolam (XANAX) 0.5 MG tablet Take 1 tablet (0.5 mg total) by mouth 2 (two) times daily as needed for anxiety. 60 tablet 2   alum & mag hydroxide-simeth (MAALOX/MYLANTA) 200-200-20 MG/5ML suspension Take 30 mLs by mouth daily as needed for indigestion or heartburn. For Bleeding Ulcers     aspirin 325 MG tablet Take 325 mg by mouth at bedtime.      beclomethasone (QVAR) 80 MCG/ACT inhaler Inhale 1 puff into the lungs 2 (two) times daily.      beclomethasone (QVAR) 80 MCG/ACT inhaler Inhale 1 puff into the lungs 2 (two) times daily. 1 each 6   benzonatate (TESSALON) 200 MG capsule Take 1 capsule (200 mg total) by mouth 3 (three) times daily as needed for cough. (Patient not taking: Reported on 11/16/2021) 45 capsule 1  BREO ELLIPTA 200-25 MCG/ACT AEPB TAKE 1 PUFF BY MOUTH EVERY DAY 60 each 3   bumetanide (BUMEX) 1 MG tablet TAKE 1 TABLET BY MOUTH TWICE A DAY 180 tablet 1   buPROPion (WELLBUTRIN XL) 150 MG 24 hr tablet Take 1 tablet (150 mg total) by mouth daily. 90 tablet 1   cyclobenzaprine (FLEXERIL) 5 MG tablet Take 1 tablet (5 mg total) by mouth 3 (three) times daily as needed for muscle spasms. 30 tablet 0   dexlansoprazole (DEXILANT) 60 MG capsule Take 60 mg by mouth daily.     Diclofenac Potassium,Migraine, (CAMBIA) 50 MG PACK Take 1 packet by mouth as needed for migraine.     diphenhydrAMINE (BENADRYL) 50 MG/ML injection Inject 1 mL (50 mg total) into the vein every 6 (six) hours as  needed (nausea). 25 mL 0   doxycycline (VIBRA-TABS) 100 MG tablet Take 1 tablet (100 mg total) by mouth 2 (two) times daily. (Patient not taking: Reported on 11/16/2021) 14 tablet 0   dronabinol (MARINOL) 2.5 MG capsule Take 1 capsule (2.5 mg total) by mouth 2 (two) times daily before lunch and supper. 30 capsule 3   EPINEPHrine 0.3 mg/0.3 mL IJ SOAJ injection Inject 0.3 mg into the muscle as needed for anaphylaxis. 2 each 2   escitalopram (LEXAPRO) 20 MG tablet Take 1 tablet (20 mg total) by mouth every evening. 90 tablet 3   Galcanezumab-gnlm 120 MG/ML SOAJ Inject 120 mg into the skin every 28 (twenty-eight) days.      heparin lock flush 100 UNIT/ML SOLN injection USE 5 MLS AS A HEPLOCK IN PORT A CATH ONCE DAILY AFTER MEDICATION ADMINISTRATION OR AS DIRECTED BY PHYSICIAN. 150 mL 1   hydrocortisone (CORTEF) 10 MG tablet Take 1-2 tablets (10-20 mg total) by mouth See admin instructions. Take 20 mg in the am and 40m in the evening (Patient taking differently: Take 10-20 mg by mouth See admin instructions. Take 20 mg by mouth in the morning and 139min the evening)     lamoTRIgine (LAMICTAL) 25 MG tablet Take one tablet every night at bedtime for 2 weeks, then increase to two tablets at bedtime. 60 tablet 2   Lancets (ONETOUCH DELICA PLUS [id number] 3 (three) times daily. for testing     levothyroxine (SYNTHROID, LEVOTHROID) 112 MCG tablet Take 112 mcg by mouth daily before breakfast.     metoprolol succinate (TOPROL-XL) 25 MG 24 hr tablet Take 37.5 mg by mouth daily.      ONETOUCH VERIO test strip 3 (three) times daily. for testing     OXYGEN Inhale 3-4 L into the lungs continuous.     potassium chloride (KLOR-CON) 10 MEQ tablet Take 1 tablet (10 mEq total) by mouth daily for 21 days. 21 tablet 0   potassium chloride (KLOR-CON) 10 MEQ tablet Take 10 mEq by mouth daily as needed (low potassium).     predniSONE (DELTASONE) 10 MG tablet Take 4 tabs x 2 days, then 3 x 2d, then 2 x 2d, then 1 x 2d and  STOP 20 tablet 0   Rimegepant Sulfate (NURTEC) 75 MG TBDP Take 75 mg by mouth daily as needed (Migraine).      rosuvastatin (CRESTOR) 10 MG tablet Take 10 mg by mouth daily.      silver sulfADIAZINE (SILVADENE) 1 % cream Apply 1 application topically daily as needed (Open Wound).      sitaGLIPtin (JANUVIA) 100 MG tablet Take 1 tablet (100 mg total) by mouth daily.  Sodium Chloride Flush (NORMAL SALINE FLUSH) 0.9 % SOLN FLUSH WITH TWO 10 ML FLUSHES IN PORT BEFORE AND AFTER FLUIDS AND MEDICATION ADMINISTRATION, TO MAINTAIN PATENCY--USE UP TO 4 TIMES DAILY. 2400 mL 1   sucralfate (CARAFATE) 1 g tablet Take 1 g by mouth daily as needed (FOR ULCERS).      SYNTHROID 100 MCG tablet Take 1 tablet (100 mcg total) by mouth every morning.     topiramate (TOPAMAX) 50 MG tablet Take 150 mg by mouth at bedtime.      Current Facility-Administered Medications  Medication Dose Route Frequency Provider Last Rate Last Admin   0.9 %  sodium chloride infusion   Intravenous PRN Margaretha Seeds, MD       Mepolizumab SOLR 100 mg  100 mg Subcutaneous Q28 days Margaretha Seeds, MD   100 mg at 08/31/19 1017   Facility-Administered Medications Ordered in Other Visits  Medication Dose Route Frequency Provider Last Rate Last Admin   sodium chloride flush (NS) 0.9 % injection 10 mL  10 mL Intracatheter PRN Nicholas Lose, MD   10 mL at 11/17/21 0959    PHYSICAL EXAMINATION: ECOG PERFORMANCE STATUS: 1 - Symptomatic but completely ambulatory  Vitals:   11/17/21 0911  BP: 119/77  Pulse: 99  Resp: 17  Temp: (!) 97.5 F (36.4 C)  SpO2: 100%   Filed Weights      LABORATORY DATA:  I have reviewed the data as listed CMP Latest Ref Rng & Units 11/17/2021 10/19/2021 04/17/2021  Glucose 70 - 99 mg/dL 118(H) 122(H) 144(H)  BUN 6 - 20 mg/dL 19 23(H) 19  Creatinine 0.44 - 1.00 mg/dL 1.07(H) 1.14(H) 1.24(H)  Sodium 135 - 145 mmol/L 139 140 141  Potassium 3.5 - 5.1 mmol/L 3.5 3.5 3.3(L)  Chloride 98 - 111 mmol/L 105  103 103  CO2 22 - 32 mmol/L _0 Calcium 8.9 - 10.3 mg/dL 9.1 9.6 9.7  Total Protein 6.5 - 8.1 g/dL 7.5 7.8 8.0  Total Bilirubin 0.3 - 1.2 mg/dL 0.4 0.3 0.3  Alkaline Phos 38 - 126 U/L 85 86 99  AST 15 - 41 U/L _1 ALT 0 - 44 U/L 38 30 40    Lab Results  Component Value Date   WBC 9.8 11/17/2021   HGB 11.2 (L) 11/17/2021   HCT 36.2 11/17/2021   MCV 79.7 (L) 11/17/2021   PLT 372 11/17/2021   NEUTROABS 6.9 11/17/2021    ASSESSMENT & PLAN:  Hx of Hodgkins lymphoma History of DCIS, thyroid cancer, cervical cancer, Hodgkin's lymphoma: In remission Lymphedema: Currently on Bumex. Severe depression: Patient seeing psychologist and psychiatrist at [facility] psychiatry.  She is currently also on Wellbutrin.  Port issues: There does not appear to be any evidence of infection.  We would like to obtain blood cultures CBC CMP and AFP based on the recommendation from Dr. Evelina Bucy. There is no clear indication for admission to the hospital or urgent antibiotics at this time.    Orders Placed This Encounter  Procedures   Culture, blood (single) w Reflex to ID Panel    Standing Status:   Future    Number of Occurrences:   1    Standing Expiration Date:   11/17/2022   Culture, AFB    Standing Status:   Future    Number of Occurrences:   1    Standing Expiration Date:   11/17/2022   CBC with Differential ([place] Only)    Standing Status:  Future    Number of Occurrences:   1    Standing Expiration Date:   11/17/2022   CMP ([place] only)    Standing Status:   Future    Number of Occurrences:   1    Standing Expiration Date:   11/17/2022   The patient has a good understanding of the overall plan. she agrees with it. she will call with any problems that may develop before the next visit here.  Total time spent: 20 mins including face to face time and time spent for planning, charting and coordination of care  Rulon Eisenmenger, MD, MPH 11/17/2021  I, Thana Ates, am acting as scribe for Dr. Nicholas Lose.  I have reviewed the above documentation for accuracy and completeness, and I agree with the above.

## 2021-11-17 ENCOUNTER — Inpatient Hospital Stay: Payer: BC Managed Care – PPO

## 2021-11-17 ENCOUNTER — Inpatient Hospital Stay: Payer: BC Managed Care – PPO | Attending: Hematology and Oncology | Admitting: Hematology and Oncology

## 2021-11-17 DIAGNOSIS — Z85850 Personal history of malignant neoplasm of thyroid: Secondary | ICD-10-CM | POA: Insufficient documentation

## 2021-11-17 DIAGNOSIS — Z923 Personal history of irradiation: Secondary | ICD-10-CM | POA: Diagnosis not present

## 2021-11-17 DIAGNOSIS — Z8571 Personal history of Hodgkin lymphoma: Secondary | ICD-10-CM | POA: Diagnosis not present

## 2021-11-17 DIAGNOSIS — Z95828 Presence of other vascular implants and grafts: Secondary | ICD-10-CM

## 2021-11-17 DIAGNOSIS — Z86000 Personal history of in-situ neoplasm of breast: Secondary | ICD-10-CM | POA: Insufficient documentation

## 2021-11-17 DIAGNOSIS — Z79899 Other long term (current) drug therapy: Secondary | ICD-10-CM | POA: Diagnosis not present

## 2021-11-17 DIAGNOSIS — Z8572 Personal history of non-Hodgkin lymphomas: Secondary | ICD-10-CM | POA: Insufficient documentation

## 2021-11-17 LAB — CBC WITH DIFFERENTIAL (CANCER CENTER ONLY)
Abs Immature Granulocytes: 0.03 10*3/uL (ref 0.00–0.07)
Basophils Absolute: 0.1 10*3/uL (ref 0.0–0.1)
Basophils Relative: 1 %
Eosinophils Absolute: 0.1 10*3/uL (ref 0.0–0.5)
Eosinophils Relative: 1 %
HCT: 36.2 % (ref 36.0–46.0)
Hemoglobin: 11.2 g/dL — ABNORMAL LOW (ref 12.0–15.0)
Immature Granulocytes: 0 %
Lymphocytes Relative: 19 %
Lymphs Abs: 1.9 10*3/uL (ref 0.7–4.0)
MCH: 24.7 pg — ABNORMAL LOW (ref 26.0–34.0)
MCHC: 30.9 g/dL (ref 30.0–36.0)
MCV: 79.7 fL — ABNORMAL LOW (ref 80.0–100.0)
Monocytes Absolute: 0.9 10*3/uL (ref 0.1–1.0)
Monocytes Relative: 9 %
Neutro Abs: 6.9 10*3/uL (ref 1.7–7.7)
Neutrophils Relative %: 70 %
Platelet Count: 372 10*3/uL (ref 150–400)
RBC: 4.54 MIL/uL (ref 3.87–5.11)
RDW: 17.9 % — ABNORMAL HIGH (ref 11.5–15.5)
WBC Count: 9.8 10*3/uL (ref 4.0–10.5)
nRBC: 0 % (ref 0.0–0.2)

## 2021-11-17 LAB — CMP (CANCER CENTER ONLY)
ALT: 38 U/L (ref 0–44)
AST: 24 U/L (ref 15–41)
Albumin: 4.2 g/dL (ref 3.5–5.0)
Alkaline Phosphatase: 85 U/L (ref 38–126)
Anion gap: 8 (ref 5–15)
BUN: 19 mg/dL (ref 6–20)
CO2: 26 mmol/L (ref 22–32)
Calcium: 9.1 mg/dL (ref 8.9–10.3)
Chloride: 105 mmol/L (ref 98–111)
Creatinine: 1.07 mg/dL — ABNORMAL HIGH (ref 0.44–1.00)
GFR, Estimated: >60 mL/min (ref >60)
Glucose, Bld: 118 mg/dL — ABNORMAL HIGH (ref 70–99)
Potassium: 3.5 mmol/L (ref 3.5–5.1)
Sodium: 139 mmol/L (ref 135–145)
Total Bilirubin: 0.4 mg/dL (ref 0.3–1.2)
Total Protein: 7.5 g/dL (ref 6.5–8.1)

## 2021-11-17 MED ORDER — SODIUM CHLORIDE 0.9% FLUSH
10.0000 mL | INTRAVENOUS | Status: DC | PRN
  Administered 2021-11-17: 10 mL

## 2021-11-17 MED ORDER — HEPARIN SOD (PORK) LOCK FLUSH 100 UNIT/ML IV SOLN
500.0000 [IU] | Freq: Once | INTRAVENOUS | Status: AC | PRN
  Administered 2021-11-17: 500 [IU]

## 2021-11-17 NOTE — Assessment & Plan Note (Signed)
History of DCIS, thyroid cancer, cervical cancer, Hodgkin's lymphoma: In remission Lymphedema: Currently on Bumex. Severe depression: Patient seeing psychologist and psychiatrist at [facility] psychiatry.  She is currently also on Wellbutrin.  Port issues

## 2021-11-18 ENCOUNTER — Encounter: Payer: Self-pay | Admitting: Hematology and Oncology

## 2021-11-18 ENCOUNTER — Telehealth: Payer: Self-pay | Admitting: Internal Medicine

## 2021-11-18 LAB — BLOOD CULTURE ID PANEL (REFLEXED) - BCID2

## 2021-11-18 NOTE — Telephone Encounter (Signed)
Brief ID Note:  Contacted by patient due to concern for positive blood culture result.  Cultures drawn yesterday at Dr Geralyn Flash office from her [facility] and patient saw Dr Baxter Flattery at [facility] on 11/16/21.  No peripheral cultures able to be done given her poor vascular access per patient's report.  This culture is positive for GPC and BCID notes Staph epidermidis without mec A/C resistance.  I called patient.  She feels well today.  No new symptoms specifically no fevers, chills. No new concerns regarding her Port site.  She is hesitant to come to the ED given her immunosuppression.  Discussed with her that positive blood culture may represent catheter colonization and not necessarily true infection but also that potential for true  infection exists.  Since she feels well, I think reasonable to have repeat blood cultures obtained on Monday and if continues to be positive for Staph epi can consider antibiotic lock therapy with retention of Port or consider Port removal if feasible.  Discussed return precautions and reasons to go to the ED over the weekend.   Raynelle Highland for Infectious [facility] 11/18/2021, 12:53 PM

## 2021-11-20 ENCOUNTER — Other Ambulatory Visit (HOSPITAL_COMMUNITY): Payer: Self-pay

## 2021-11-20 NOTE — Telephone Encounter (Addendum)
Patient called office regarding culture results. Patient advised Dr. Baxter Flattery is currently out of the office on vacation. I advised the patient that the lab reached out to Dr. Lindi Adie regarding her results and she should reach out to his office to discuss the results and the next steps.  [name], CMA

## 2021-11-21 ENCOUNTER — Encounter: Payer: Self-pay | Admitting: *Deleted

## 2021-11-21 ENCOUNTER — Other Ambulatory Visit (HOSPITAL_COMMUNITY): Payer: Self-pay

## 2021-11-22 ENCOUNTER — Other Ambulatory Visit: Payer: Self-pay

## 2021-11-22 ENCOUNTER — Ambulatory Visit (INDEPENDENT_AMBULATORY_CARE_PROVIDER_SITE_OTHER): Payer: BC Managed Care – PPO | Admitting: Internal Medicine

## 2021-11-22 ENCOUNTER — Other Ambulatory Visit: Payer: Self-pay | Admitting: *Deleted

## 2021-11-22 ENCOUNTER — Encounter: Payer: Self-pay | Admitting: Internal Medicine

## 2021-11-22 ENCOUNTER — Telehealth: Payer: Self-pay

## 2021-11-22 DIAGNOSIS — R7881 Bacteremia: Secondary | ICD-10-CM

## 2021-11-22 DIAGNOSIS — Z9889 Other specified postprocedural states: Secondary | ICD-10-CM

## 2021-11-22 DIAGNOSIS — Z95828 Presence of other vascular implants and grafts: Secondary | ICD-10-CM

## 2021-11-22 DIAGNOSIS — E1169 Type 2 diabetes mellitus with other specified complication: Secondary | ICD-10-CM | POA: Diagnosis not present

## 2021-11-22 DIAGNOSIS — E890 Postprocedural hypothyroidism: Secondary | ICD-10-CM | POA: Diagnosis not present

## 2021-11-22 DIAGNOSIS — E2740 Unspecified adrenocortical insufficiency: Secondary | ICD-10-CM | POA: Diagnosis not present

## 2021-11-22 DIAGNOSIS — D443 Neoplasm of uncertain behavior of pituitary gland: Secondary | ICD-10-CM | POA: Diagnosis not present

## 2021-11-22 NOTE — Progress Notes (Unsigned)
Virtual Visit via Telephone Note  I connected with [name] on 11/22/21 at  9:30 AM EST by telephone and verified that I am speaking with the correct person using two identifiers.  Location: Patient: at home Provider: at clinic   I discussed the limitations, risks, security and privacy concerns of performing an evaluation and management service by telephone and the availability of in person appointments. I also discussed with the patient that there may be a patient responsible charge related to this service. The patient expressed understanding and agreed to proceed.   History of Present Illness: Not having any fever, chills, nor any redness to the site.    Observations/Objective: Portacath site unchanged on photos  Assessment and Plan: plan to repeat blood cx ( get 2 sets) and assess if still positive, then will treat with abtx lock. Appears to still have no systemic symptoms to suggest bacteremia. 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 She has limited venous access thus difficult to get peripheral stick. Ideally, would like to get a peripheral set of blood cx. Will discuss with dr Lindi Adie and his office to coordinate repeat blood cultures   Follow Up Instructions:    I discussed the assessment and treatment plan with the patient. The patient was provided an opportunity to ask questions and all were answered. The patient agreed with the plan and demonstrated an understanding of the instructions.   The patient was advised to call back or seek an in-person evaluation if the symptoms worsen or if the condition fails to improve as anticipated.  I provided *** minutes of non-face-to-face time during this encounter.   Carlyle Basques, MD  Patient ID: [name], female   DOB: 10-23-1970, 51 y.o.   MRN: [id number]  HPI   Outpatient Encounter Medications as of 11/22/2021  Medication Sig   albuterol (2.5 MG/3ML) 0.083% NEBU 3 mL, albuterol (5 MG/ML) 0.5% NEBU 0.5 mL  Inhale 3 mg into the lungs every 6 (six) hours as needed (SOB).    albuterol (VENTOLIN HFA) 108 (90 Base) MCG/ACT inhaler Inhale 2 puffs into the lungs every 6 (six) hours as needed for wheezing or shortness of breath.   ALPRAZolam (XANAX) 0.5 MG tablet Take 1 tablet (0.5 mg total) by mouth 2 (two) times daily as needed for anxiety.   alum & mag hydroxide-simeth (MAALOX/MYLANTA) 200-200-20 MG/5ML suspension Take 30 mLs by mouth daily as needed for indigestion or heartburn. For Bleeding Ulcers   aspirin 325 MG tablet Take 325 mg by mouth at bedtime.    beclomethasone (QVAR) 80 MCG/ACT inhaler Inhale 1 puff into the lungs 2 (two) times daily.    beclomethasone (QVAR) 80 MCG/ACT inhaler Inhale 1 puff into the lungs 2 (two) times daily.   benzonatate (TESSALON) 200 MG capsule Take 1 capsule (200 mg total) by mouth 3 (three) times daily as needed for cough.   BREO ELLIPTA 200-25 MCG/ACT AEPB TAKE 1 PUFF BY MOUTH EVERY DAY   bumetanide (BUMEX) 1 MG tablet TAKE 1 TABLET BY MOUTH TWICE A DAY   buPROPion (WELLBUTRIN XL) 150 MG 24 hr tablet Take 1 tablet (150 mg total) by mouth daily.   cyclobenzaprine (FLEXERIL) 5 MG tablet Take 1 tablet (5 mg total) by mouth 3 (three) times daily as needed for muscle spasms.   dexlansoprazole (DEXILANT) 60 MG capsule Take 60 mg by mouth daily.   Diclofenac Potassium,Migraine, (CAMBIA) 50 MG PACK Take 1 packet by mouth as needed for migraine.   diphenhydrAMINE (  BENADRYL) 50 MG/ML injection Inject 1 mL (50 mg total) into the vein every 6 (six) hours as needed (nausea).   doxycycline (VIBRA-TABS) 100 MG tablet Take 1 tablet (100 mg total) by mouth 2 (two) times daily.   dronabinol (MARINOL) 2.5 MG capsule Take 1 capsule (2.5 mg total) by mouth 2 (two) times daily before lunch and supper.   EPINEPHrine 0.3 mg/0.3 mL IJ SOAJ injection Inject 0.3 mg into the muscle as needed for anaphylaxis.   escitalopram (LEXAPRO) 20 MG tablet Take 1 tablet (20 mg total) by mouth every  evening.   Galcanezumab-gnlm 120 MG/ML SOAJ Inject 120 mg into the skin every 28 (twenty-eight) days.    heparin lock flush 100 UNIT/ML SOLN injection USE 5 MLS AS A HEPLOCK IN PORT A CATH ONCE DAILY AFTER MEDICATION ADMINISTRATION OR AS DIRECTED BY PHYSICIAN.   hydrocortisone (CORTEF) 10 MG tablet Take 1-2 tablets (10-20 mg total) by mouth See admin instructions. Take 20 mg in the am and 56m in the evening (Patient taking differently: Take 10-20 mg by mouth See admin instructions. Take 20 mg by mouth in the morning and 173min the evening)   lamoTRIgine (LAMICTAL) 25 MG tablet Take one tablet every night at bedtime for 2 weeks, then increase to two tablets at bedtime.   Lancets (ONETOUCH DELICA PLUS [id number] 3 (three) times daily. for testing   levothyroxine (SYNTHROID, LEVOTHROID) 112 MCG tablet Take 112 mcg by mouth daily before breakfast.   metoprolol succinate (TOPROL-XL) 25 MG 24 hr tablet Take 37.5 mg by mouth daily.    ONETOUCH VERIO test strip 3 (three) times daily. for testing   OXYGEN Inhale 3-4 L into the lungs continuous.   potassium chloride (KLOR-CON) 10 MEQ tablet Take 10 mEq by mouth daily as needed (low potassium).   predniSONE (DELTASONE) 10 MG tablet Take 4 tabs x 2 days, then 3 x 2d, then 2 x 2d, then 1 x 2d and STOP   Rimegepant Sulfate (NURTEC) 75 MG TBDP Take 75 mg by mouth daily as needed (Migraine).    rosuvastatin (CRESTOR) 10 MG tablet Take 10 mg by mouth daily.    silver sulfADIAZINE (SILVADENE) 1 % cream Apply 1 application topically daily as needed (Open Wound).    sitaGLIPtin (JANUVIA) 100 MG tablet Take 1 tablet (100 mg total) by mouth daily.   Sodium Chloride Flush (NORMAL SALINE FLUSH) 0.9 % SOLN FLUSH WITH TWO 10 ML FLUSHES IN PORT BEFORE AND AFTER FLUIDS AND MEDICATION ADMINISTRATION, TO MAINTAIN PATENCY--USE UP TO 4 TIMES DAILY.   sucralfate (CARAFATE) 1 g tablet Take 1 g by mouth daily as needed (FOR ULCERS).    SYNTHROID 100 MCG tablet Take 1 tablet  (100 mcg total) by mouth every morning.   topiramate (TOPAMAX) 50 MG tablet Take 150 mg by mouth at bedtime.    albuterol (PROVENTIL) (2.5 MG/3ML) 0.083% nebulizer solution Take 3 mLs (2.5 mg total) by nebulization every 6 (six) hours as needed for wheezing or shortness of breath.   potassium chloride (KLOR-CON) 10 MEQ tablet Take 1 tablet (10 mEq total) by mouth daily for 21 days.   Facility-Administered Encounter Medications as of 11/22/2021  Medication   0.9 %  sodium chloride infusion   Mepolizumab SOLR 100 mg     Patient Active Problem List   Diagnosis Date Noted   Major depressive disorder, recurrent episode, moderate (HCC)    Generalized anxiety disorder    Seizure-like activity ([place]/07/[place]   High anion gap metabolic  acidosis 12/08/2020   Hypokalemia 12/08/2020   AKI (acute kidney injury) ([place]) 04/27/2020   Vasculopathy 04/27/2020   Bacteremia 04/19/2020   Addison's disease ([place])    Nausea 04/01/2020   Generalized weakness 02/10/2020   Headache 02/10/2020   Subcutaneous nodule 02/01/2020   Port-A-Cath in place 09/09/2019   Moderate persistent asthma without complication 50/64/6288   Fever 07/21/2019   Superficial thrombophlebitis 07/16/2019   Injury of left index finger 12/24/2018   History of pituitary adenoma 12/23/2018   History of cervical cancer 12/23/2018   Chronic respiratory failure with hypoxia (HCC) 12/23/2018   SOB (shortness of breath)    Nephrolithiasis 11/07/2018   Oxygen dependent 11/07/2018   Migraine 11/07/2018   PVC's (premature ventricular contractions) 10/27/2018   Hyperlipidemia 03/09/2014   Hx of Hodgkins lymphoma    History of ductal carcinoma in situ (DCIS) of breast    History of thyroid cancer    Adrenal insufficiency ([place])    Hypertension    Raynaud phenomenon    GERD (gastroesophageal reflux disease)      Health Maintenance Due  Topic Date Due   URINE MICROALBUMIN  Never done   Zoster Vaccines- Shingrix (1 of 2) Never done    PAP SMEAR-Modifier  Never done   COLONOSCOPY (Pts 45-38yr Insurance coverage will need to be confirmed)  Never done   COVID-19 Vaccine (3 - Moderna risk series) 02/09/2020   TETANUS/TDAP  12/18/2020   MAMMOGRAM  Never done   INFLUENZA VACCINE  05/01/2021     Review of Systems  Physical Exam   LMP  (LMP Unknown)    No results found for: CD4TCELL No results found for: CD4TABS No results found for: HIV1RNAQUANT No results found for: HEPBSAB No results found for: RPR, LABRPR  CBC Lab Results  Component Value Date   WBC 9.8 11/17/2021   RBC 4.54 11/17/2021   HGB 11.2 (L) 11/17/2021   HCT 36.2 11/17/2021   PLT 372 11/17/2021   MCV 79.7 (L) 11/17/2021   MCH 24.7 (L) 11/17/2021   MCHC 30.9 11/17/2021   RDW 17.9 (H) 11/17/2021   LYMPHSABS 1.9 11/17/2021   MONOABS 0.9 11/17/2021   EOSABS 0.1 11/17/2021    BMET Lab Results  Component Value Date   NA 139 11/17/2021   K 3.5 11/17/2021   CL 105 11/17/2021   CO2 26 11/17/2021   GLUCOSE 118 (H) 11/17/2021   BUN 19 11/17/2021   CREATININE 1.07 (H) 11/17/2021   CALCIUM 9.1 11/17/2021   GFRNONAA >60 11/17/2021   GFRAA >60 04/27/2020      Assessment and Plan

## 2021-11-22 NOTE — Progress Notes (Signed)
Per MD request Orders placed to obtain new set of blood cultures.

## 2021-11-22 NOTE — Telephone Encounter (Signed)
Left message with Dr.Gudena's nurse asking for call back. Dr.Snider requesting patient needs 2 sets of blood cx redone. [place] office number [phone]   Tomi Bamberger, CMA

## 2021-11-23 ENCOUNTER — Inpatient Hospital Stay: Payer: BC Managed Care – PPO

## 2021-11-23 ENCOUNTER — Other Ambulatory Visit: Payer: Self-pay

## 2021-11-23 DIAGNOSIS — Z95828 Presence of other vascular implants and grafts: Secondary | ICD-10-CM

## 2021-11-23 DIAGNOSIS — Z8572 Personal history of non-Hodgkin lymphomas: Secondary | ICD-10-CM | POA: Diagnosis not present

## 2021-11-23 DIAGNOSIS — Z923 Personal history of irradiation: Secondary | ICD-10-CM | POA: Diagnosis not present

## 2021-11-23 DIAGNOSIS — Z86000 Personal history of in-situ neoplasm of breast: Secondary | ICD-10-CM | POA: Diagnosis not present

## 2021-11-23 DIAGNOSIS — C8198 Hodgkin lymphoma, unspecified, lymph nodes of multiple sites: Secondary | ICD-10-CM

## 2021-11-23 DIAGNOSIS — Z79899 Other long term (current) drug therapy: Secondary | ICD-10-CM | POA: Diagnosis not present

## 2021-11-23 DIAGNOSIS — Z85850 Personal history of malignant neoplasm of thyroid: Secondary | ICD-10-CM | POA: Diagnosis not present

## 2021-11-23 LAB — CBC WITH DIFFERENTIAL (CANCER CENTER ONLY)
Abs Immature Granulocytes: 0.02 10*3/uL (ref 0.00–0.07)
Basophils Absolute: 0.1 10*3/uL (ref 0.0–0.1)
Basophils Relative: 1 %
Eosinophils Absolute: 0.1 10*3/uL (ref 0.0–0.5)
Eosinophils Relative: 1 %
HCT: 37.4 % (ref 36.0–46.0)
Hemoglobin: 11.5 g/dL — ABNORMAL LOW (ref 12.0–15.0)
Immature Granulocytes: 0 %
Lymphocytes Relative: 21 %
Lymphs Abs: 1.7 10*3/uL (ref 0.7–4.0)
MCH: 24.3 pg — ABNORMAL LOW (ref 26.0–34.0)
MCHC: 30.7 g/dL (ref 30.0–36.0)
MCV: 79.1 fL — ABNORMAL LOW (ref 80.0–100.0)
Monocytes Absolute: 0.8 10*3/uL (ref 0.1–1.0)
Monocytes Relative: 10 %
Neutro Abs: 5.4 10*3/uL (ref 1.7–7.7)
Neutrophils Relative %: 67 %
Platelet Count: 392 10*3/uL (ref 150–400)
RBC: 4.73 MIL/uL (ref 3.87–5.11)
RDW: 17.5 % — ABNORMAL HIGH (ref 11.5–15.5)
WBC Count: 8.1 10*3/uL (ref 4.0–10.5)
nRBC: 0 % (ref 0.0–0.2)

## 2021-11-23 LAB — CMP (CANCER CENTER ONLY)
ALT: 34 U/L (ref 0–44)
AST: 23 U/L (ref 15–41)
Albumin: 4.3 g/dL (ref 3.5–5.0)
Alkaline Phosphatase: 95 U/L (ref 38–126)
Anion gap: 10 (ref 5–15)
BUN: 25 mg/dL — ABNORMAL HIGH (ref 6–20)
CO2: 28 mmol/L (ref 22–32)
Calcium: 9.1 mg/dL (ref 8.9–10.3)
Chloride: 101 mmol/L (ref 98–111)
Creatinine: 1.19 mg/dL — ABNORMAL HIGH (ref 0.44–1.00)
GFR, Estimated: 56 mL/min — ABNORMAL LOW (ref >60)
Glucose, Bld: 147 mg/dL — ABNORMAL HIGH (ref 70–99)
Potassium: 3.1 mmol/L — ABNORMAL LOW (ref 3.5–5.1)
Sodium: 139 mmol/L (ref 135–145)
Total Bilirubin: 0.4 mg/dL (ref 0.3–1.2)
Total Protein: 7.9 g/dL (ref 6.5–8.1)

## 2021-11-23 MED ORDER — HEPARIN SOD (PORK) LOCK FLUSH 100 UNIT/ML IV SOLN
500.0000 [IU] | Freq: Once | INTRAVENOUS | Status: AC | PRN
  Administered 2021-11-23: 500 [IU]

## 2021-11-23 MED ORDER — SODIUM CHLORIDE 0.9% FLUSH
10.0000 mL | INTRAVENOUS | Status: DC | PRN
  Administered 2021-11-23: 10 mL

## 2021-11-24 ENCOUNTER — Telehealth (HOSPITAL_BASED_OUTPATIENT_CLINIC_OR_DEPARTMENT_OTHER): Payer: Self-pay | Admitting: Emergency Medicine

## 2021-11-24 ENCOUNTER — Telehealth: Payer: Self-pay | Admitting: Internal Medicine

## 2021-11-24 ENCOUNTER — Telehealth: Payer: Self-pay

## 2021-11-24 ENCOUNTER — Other Ambulatory Visit: Payer: Self-pay | Admitting: Internal Medicine

## 2021-11-24 ENCOUNTER — Other Ambulatory Visit: Payer: Self-pay | Admitting: Hematology and Oncology

## 2021-11-24 LAB — BLOOD CULTURE ID PANEL (REFLEXED) - BCID2

## 2021-11-24 LAB — CULTURE, BLOOD (SINGLE): Special Requests: ADEQUATE

## 2021-11-24 MED ORDER — FLUCONAZOLE 150 MG PO TABS
150.0000 mg | ORAL_TABLET | Freq: Every day | ORAL | 0 refills | Status: DC
Start: 2021-11-24 — End: 2022-04-09

## 2021-11-24 NOTE — Telephone Encounter (Signed)
Patient called office today regarding blood culture results done 2/23. Would like to know what next steps would be. Will forward message to MD. [name], RMA

## 2021-11-24 NOTE — Telephone Encounter (Signed)
Repeat blood cx showing staph epi. Plan to treat for 2 weeks of IV abtx and will arrange for TTE. This will be communicated to patient through nurse coordinator, Jinny Blossom.  Home health orders listed below:  Diagnosis: Staph epi bacteremia  Culture Result:staph epi  Allergies  Allergen Reactions   Ferrous Bisglycinate Chelate [Iron] Anaphylaxis    Only IV    Mushroom Extract Complex Anaphylaxis   Na Ferric Gluc Cplx In Sucrose Anaphylaxis   Cymbalta [Duloxetine Hcl] Swelling and Anxiety   Phenergan [Promethazine]     Other reaction(s): Unknown   Succinylcholine Other (See Comments)    Lock Jaw   Buprenorphine Hcl Hives   Compazine Other (See Comments)    Altered mental status Aggression   Metoclopramide Other (See Comments)    Dystonia   Morphine And Related Hives   Ondansetron Hives and Rash    Other reaction(s): Unknown   Promethazine Hcl Hives   Tegaderm Ag Mesh [Silver] Rash    Old formulation only, is able to tolerate new formulation    OPAT Orders Discharge antibiotics to be given via Portacath Discharge antibiotics: Per pharmacy protocol - cefazolin 2gm IV Q 8hr  Duration: 14 days End Date: 14 days from initiation of IV abtx  PIC Care Per Protocol:  Home health RN for IV administration and teaching; PICC line care and labs.    At conclusion of 14 days of therapy, please draw Labs while on IV antibiotics: __x CBC with differential _x_ BMP    Fax weekly labs to [phone]  Clinic Follow Up Appt: 3 wk  _0  [facility]

## 2021-11-24 NOTE — Telephone Encounter (Signed)
Please review and refill.  Thanks!

## 2021-11-24 NOTE — Telephone Encounter (Signed)
Received blood culture results from lab, forwarded to EDP.  Cultures were from oncology department, advised to forward to appropriate department. [facility] called and informed Tech to forward to oncology.

## 2021-11-24 NOTE — Telephone Encounter (Signed)
Patient updated with plans for IV Abx.   Beryle Flock, RN

## 2021-11-24 NOTE — Telephone Encounter (Signed)
Spoke with patient and relayed plans for IV antibiotics to be administered through portacath.   Sent OPAT orders to Carolynn Sayers with [facility] and made her aware of port.   Patient has received cefazolin before, no need for first dose. [facility] to reach out to patient to set up home health.   Asked patient to please call with any questions or concerns.   Beryle Flock, RN

## 2021-11-25 DIAGNOSIS — A327 Listerial sepsis: Secondary | ICD-10-CM | POA: Diagnosis not present

## 2021-11-25 DIAGNOSIS — R7881 Bacteremia: Secondary | ICD-10-CM | POA: Diagnosis not present

## 2021-11-26 DIAGNOSIS — R7881 Bacteremia: Secondary | ICD-10-CM | POA: Diagnosis not present

## 2021-11-26 DIAGNOSIS — A327 Listerial sepsis: Secondary | ICD-10-CM | POA: Diagnosis not present

## 2021-11-26 LAB — CULTURE, BLOOD (SINGLE): Special Requests: ADEQUATE

## 2021-11-27 ENCOUNTER — Telehealth: Payer: Self-pay

## 2021-11-27 DIAGNOSIS — A327 Listerial sepsis: Secondary | ICD-10-CM | POA: Diagnosis not present

## 2021-11-27 DIAGNOSIS — R7881 Bacteremia: Secondary | ICD-10-CM | POA: Diagnosis not present

## 2021-11-27 DIAGNOSIS — M869 Osteomyelitis, unspecified: Secondary | ICD-10-CM | POA: Diagnosis not present

## 2021-11-27 NOTE — Telephone Encounter (Signed)
Patient called complaining of her bio patch being saturated after administering her IV antibiotics.   I spoke to patient's nurse Katharine Look [phone]) she stated she was able to access patient's port today and change the Biopatch.   I called the patient to follow up with the patient after administering IV antibiotics this afternoon. Patient stated that she was able to administer her IV antibiotic fine and no issue. Her next dose will be at midnight tonight and patient will follow up in the morning and let our office know how it went and if the Biopatch is saturated after administering.  Dion Parrow T Brooks Sailors

## 2021-11-28 ENCOUNTER — Ambulatory Visit: Payer: BC Managed Care – PPO

## 2021-11-28 ENCOUNTER — Other Ambulatory Visit: Payer: Self-pay

## 2021-11-28 ENCOUNTER — Telehealth (INDEPENDENT_AMBULATORY_CARE_PROVIDER_SITE_OTHER): Payer: BC Managed Care – PPO | Admitting: Internal Medicine

## 2021-11-28 VITALS — BP 131/83 | HR 92 | Temp 99.0°F | Resp 18 | Ht 67.0 in | Wt 187.0 lb

## 2021-11-28 DIAGNOSIS — Z9889 Other specified postprocedural states: Secondary | ICD-10-CM | POA: Diagnosis not present

## 2021-11-28 DIAGNOSIS — R7881 Bacteremia: Secondary | ICD-10-CM | POA: Diagnosis not present

## 2021-11-28 DIAGNOSIS — Z95828 Presence of other vascular implants and grafts: Secondary | ICD-10-CM

## 2021-11-28 DIAGNOSIS — A327 Listerial sepsis: Secondary | ICD-10-CM | POA: Diagnosis not present

## 2021-11-28 DIAGNOSIS — R911 Solitary pulmonary nodule: Secondary | ICD-10-CM | POA: Diagnosis not present

## 2021-11-28 NOTE — Progress Notes (Signed)
Virtual Visit via Telephone Note  I connected with [name] on 11/29/21 at  4:00 PM EST by telephone and verified that I am speaking with the correct person using two identifiers.  Location: Patient: in car Provider: clinic   I discussed the limitations, risks, security and privacy concerns of performing an evaluation and management service by telephone and the availability of in person appointments. I also discussed with the patient that there may be a patient responsible charge related to this service. The patient expressed understanding and agreed to proceed.   History of Present Illness:  Having dysfunction with port. She notices her porta-cath padding saturated with abtx infusion. She was evaluated at the infusion center who confirmed these finding. Not able to draw blood through port.  No fever, chills, nightsweats, no erythema about her port   Observations/Objective: appears to be not in any distress (in the few minutes seen on video)   Assessment and Plan: - staph epi bacteremia = unclear if really a pathogen since she is asymptomatic. She has had 3 days of iv cefazolin thus far, but now unable to do so. Will change to oral abtx to finish out course. - port dysfunction = will need to get her port replaced  Follow Up Instructions: Will arrange for port removal. will discuss with dr Lindi Adie.   I discussed the assessment and treatment plan with the patient. The patient was provided an opportunity to ask questions and all were answered. The patient agreed with the plan and demonstrated an understanding of the instructions.   The patient was advised to call back or seek an in-person evaluation if the symptoms worsen or if the condition fails to improve as anticipated.  I provided 10 minutes of non-face-to-face time during this encounter.   Carlyle Basques, MD

## 2021-11-28 NOTE — Telephone Encounter (Signed)
Patient called back this morning stating that her Biopatch became saturated again this morning after her infusion. Patient reports the nurse did replace the needle yesterday for the port, but it appears to be shorter than the previous one she had. Patient will go to [facility] today at 2pm to have the port evaluated.  [name] T Brooks Sailors

## 2021-11-28 NOTE — Progress Notes (Signed)
Patient presented for evaluation of leaking port.  Port not accessed on arrival.  Site is discolored and has a small hole which appears to leak clear fluid upon palpation.  Additional area under port site feels swollen.  Spoke with Dr. Baxter Flattery and she will perform video visit for further evaluation and planning.

## 2021-11-29 ENCOUNTER — Encounter: Payer: Self-pay | Admitting: Internal Medicine

## 2021-11-29 ENCOUNTER — Encounter: Payer: Self-pay | Admitting: Adult Health

## 2021-11-29 ENCOUNTER — Encounter: Payer: Self-pay | Admitting: Hematology and Oncology

## 2021-11-29 ENCOUNTER — Telehealth (INDEPENDENT_AMBULATORY_CARE_PROVIDER_SITE_OTHER): Payer: BC Managed Care – PPO | Admitting: Adult Health

## 2021-11-29 ENCOUNTER — Telehealth: Payer: Self-pay

## 2021-11-29 DIAGNOSIS — F411 Generalized anxiety disorder: Secondary | ICD-10-CM

## 2021-11-29 DIAGNOSIS — G4700 Insomnia, unspecified: Secondary | ICD-10-CM

## 2021-11-29 DIAGNOSIS — R7881 Bacteremia: Secondary | ICD-10-CM | POA: Diagnosis not present

## 2021-11-29 DIAGNOSIS — F410 Panic disorder [episodic paroxysmal anxiety] without agoraphobia: Secondary | ICD-10-CM | POA: Diagnosis not present

## 2021-11-29 DIAGNOSIS — F331 Major depressive disorder, recurrent, moderate: Secondary | ICD-10-CM

## 2021-11-29 DIAGNOSIS — A327 Listerial sepsis: Secondary | ICD-10-CM | POA: Diagnosis not present

## 2021-11-29 NOTE — Progress Notes (Signed)
[name] [id number] [birth year] 51 y.o.  Virtual Visit via Video Note  I connected with pt @ on 11/29/21 at  1:00 PM EST by a video enabled telemedicine application and verified that I am speaking with the correct person using two identifiers.   I discussed the limitations of evaluation and management by telemedicine and the availability of in person appointments. The patient expressed understanding and agreed to proceed.  I discussed the assessment and treatment plan with the patient. The patient was provided an opportunity to ask questions and all were answered. The patient agreed with the plan and demonstrated an understanding of the instructions.   The patient was advised to call back or seek an in-person evaluation if the symptoms worsen or if the condition fails to improve as anticipated.  I provided 25 minutes of non-face-to-face time during this encounter.  The patient was located at home.  The provider was located at [place].   [name], [name]   Subjective:   Patient ID:  [name] is a 51 y.o. (DOB 06/07/1971) female.  Chief Complaint: No chief complaint on file.   HPI [name] presents for follow-up of MDD, GAD, insomnia, and panic attacks.  Describes mood today as "better". Pleasant. Tearful at times. Mood symptoms - reports depression and anxiety. Reports 2 panic attacks - "top 2 of all time". Denies irritability. Stating "I'm really struggling". Has tolerated addition of the Lamictal. Reports decreased mood "extremes - highs not so high and lows not so lows" in the first few weeks. Has been cautious about increasing the dose of Lamictal with recent issues with port and antibiotic treatment - concerned about the rash. Planning to increase the dose to 57m daily. 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 Planning to see DRinaldo Cloudon March 21,2023.Fighting cancer since age 51- Hodgkins stage 424 Having issues with port since last  visit. Also treated for multiple medical issues. Varying interest and motivation. Taking medications as prescribed.  Energy levels low. Active, does not have a regular exercise routine with current physical disabilities. Enjoys some usual interests and activities. Married. Lives with husband. Has a 22year old daughter and a 139year old son. Spending time with family.  Appetite decreased. Weight loss 181 pounds from 197 pounds.  Sleeps well. Averages 8 hours with Ambien. Reports some daytime napping. Focus and concentration stable. Completing tasks. Managing aspects of household. Disabled in 2016 - [employer] Den[employer] SI or HI.  Denies AH or VH.  Previous medication trials: Wellbutrin - mean, Zoloft-not helpful   Review of Systems:  Review of Systems  Musculoskeletal:  Negative for gait problem.  Neurological:  Negative for tremors.  Psychiatric/Behavioral:         Please refer to HPI   Medications: I have reviewed the patient's current medications.  Current Outpatient Medications  Medication Sig Dispense Refill   albuterol (2.5 MG/3ML) 0.083% NEBU 3 mL, albuterol (5 MG/ML) 0.5% NEBU 0.5 mL Inhale 3 mg into the lungs every 6 (six) hours as needed (SOB).      albuterol (PROVENTIL) (2.5 MG/3ML) 0.083% nebulizer solution Take 3 mLs (2.5 mg total) by nebulization every 6 (six) hours as needed for wheezing or shortness of breath. 75 mL 12   albuterol (VENTOLIN HFA) 108 (90 Base) MCG/ACT inhaler Inhale 2 puffs into the lungs every 6 (six) hours as needed for wheezing or shortness of breath.     ALPRAZolam (XANAX) 0.5 MG tablet Take  1 tablet (0.5 mg total) by mouth 2 (two) times daily as needed for anxiety. 60 tablet 2   alum & mag hydroxide-simeth (MAALOX/MYLANTA) 200-200-20 MG/5ML suspension Take 30 mLs by mouth daily as needed for indigestion or heartburn. For Bleeding Ulcers     aspirin 325 MG tablet Take 325 mg by mouth at bedtime.      beclomethasone (QVAR) 80 MCG/ACT inhaler Inhale 1  puff into the lungs 2 (two) times daily.      beclomethasone (QVAR) 80 MCG/ACT inhaler Inhale 1 puff into the lungs 2 (two) times daily. 1 each 6   benzonatate (TESSALON) 200 MG capsule Take 1 capsule (200 mg total) by mouth 3 (three) times daily as needed for cough. 45 capsule 1   BREO ELLIPTA 200-25 MCG/ACT AEPB TAKE 1 PUFF BY MOUTH EVERY DAY 60 each 3   bumetanide (BUMEX) 1 MG tablet TAKE 1 TABLET BY MOUTH TWICE A DAY 180 tablet 1   buPROPion (WELLBUTRIN XL) 150 MG 24 hr tablet Take 1 tablet (150 mg total) by mouth daily. 90 tablet 1   cyclobenzaprine (FLEXERIL) 5 MG tablet Take 1 tablet (5 mg total) by mouth 3 (three) times daily as needed for muscle spasms. 30 tablet 0   dexlansoprazole (DEXILANT) 60 MG capsule Take 60 mg by mouth daily.     Diclofenac Potassium,Migraine, (CAMBIA) 50 MG PACK Take 1 packet by mouth as needed for migraine.     diphenhydrAMINE (BENADRYL) 50 MG/ML injection Inject 1 mL (50 mg total) into the vein every 6 (six) hours as needed (nausea). 25 mL 0   doxycycline (VIBRA-TABS) 100 MG tablet Take 1 tablet (100 mg total) by mouth 2 (two) times daily. 14 tablet 0   dronabinol (MARINOL) 2.5 MG capsule TAKE 1 CAPSULE BY MOUTH TWICE DAILY AS NEEDED 30 capsule 0   EPINEPHrine 0.3 mg/0.3 mL IJ SOAJ injection Inject 0.3 mg into the muscle as needed for anaphylaxis. 2 each 2   escitalopram (LEXAPRO) 20 MG tablet Take 1 tablet (20 mg total) by mouth every evening. 90 tablet 3   fluconazole (DIFLUCAN) 150 MG tablet Take 1 tablet (150 mg total) by mouth daily. If needed for yeast infection 5 tablet 0   Galcanezumab-gnlm 120 MG/ML SOAJ Inject 120 mg into the skin every 28 (twenty-eight) days.      heparin lock flush 100 UNIT/ML SOLN injection USE 5 MLS AS A HEPLOCK IN PORT A CATH ONCE DAILY AFTER MEDICATION ADMINISTRATION OR AS DIRECTED BY PHYSICIAN. 150 mL 1   hydrocortisone (CORTEF) 10 MG tablet Take 1-2 tablets (10-20 mg total) by mouth See admin instructions. Take 20 mg in the am  and 86m in the evening (Patient taking differently: Take 10-20 mg by mouth See admin instructions. Take 20 mg by mouth in the morning and 139min the evening)     lamoTRIgine (LAMICTAL) 25 MG tablet Take one tablet every night at bedtime for 2 weeks, then increase to two tablets at bedtime. 60 tablet 2   Lancets (ONETOUCH DELICA PLUS [id number] 3 (three) times daily. for testing     levothyroxine (SYNTHROID, LEVOTHROID) 112 MCG tablet Take 112 mcg by mouth daily before breakfast.     LORazepam (ATIVAN) 0.5 MG tablet TAKE 1 TABLET BY MOUTH TWICE A DAY AS NEEDED FOR ANXIETY 30 tablet 1   metoprolol succinate (TOPROL-XL) 25 MG 24 hr tablet Take 37.5 mg by mouth daily.      ONETOUCH VERIO test strip 3 (three) times daily. for testing  OXYGEN Inhale 3-4 L into the lungs continuous.     potassium chloride (KLOR-CON) 10 MEQ tablet Take 1 tablet (10 mEq total) by mouth daily for 21 days. 21 tablet 0   potassium chloride (KLOR-CON) 10 MEQ tablet Take 10 mEq by mouth daily as needed (low potassium).     predniSONE (DELTASONE) 10 MG tablet Take 4 tabs x 2 days, then 3 x 2d, then 2 x 2d, then 1 x 2d and STOP 20 tablet 0   Rimegepant Sulfate (NURTEC) 75 MG TBDP Take 75 mg by mouth daily as needed (Migraine).      rosuvastatin (CRESTOR) 10 MG tablet Take 10 mg by mouth daily.      silver sulfADIAZINE (SILVADENE) 1 % cream Apply 1 application topically daily as needed (Open Wound).      sitaGLIPtin (JANUVIA) 100 MG tablet Take 1 tablet (100 mg total) by mouth daily.     Sodium Chloride Flush (NORMAL SALINE FLUSH) 0.9 % SOLN FLUSH WITH TWO 10 ML FLUSHES IN PORT BEFORE AND AFTER FLUIDS AND MEDICATION ADMINISTRATION, TO MAINTAIN PATENCY--USE UP TO 4 TIMES DAILY. 2400 mL 1   sucralfate (CARAFATE) 1 g tablet Take 1 g by mouth daily as needed (FOR ULCERS).      SYNTHROID 100 MCG tablet Take 1 tablet (100 mcg total) by mouth every morning.     topiramate (TOPAMAX) 50 MG tablet Take 150 mg by mouth at bedtime.       Current Facility-Administered Medications  Medication Dose Route Frequency Provider Last Rate Last Admin   0.9 %  sodium chloride infusion   Intravenous PRN Margaretha Seeds, MD       Mepolizumab SOLR 100 mg  100 mg Subcutaneous Q28 days Margaretha Seeds, MD   100 mg at 08/31/19 1017    Medication Side Effects: None  Allergies:  Allergies  Allergen Reactions   Ferrous Bisglycinate Chelate [Iron] Anaphylaxis    Only IV    Mushroom Extract Complex Anaphylaxis   Na Ferric Gluc Cplx In Sucrose Anaphylaxis   Cymbalta [Duloxetine Hcl] Swelling and Anxiety   Phenergan [Promethazine]     Other reaction(s): Unknown   Succinylcholine Other (See Comments)    Lock Jaw   Buprenorphine Hcl Hives   Compazine Other (See Comments)    Altered mental status Aggression   Metoclopramide Other (See Comments)    Dystonia   Morphine And Related Hives   Ondansetron Hives and Rash    Other reaction(s): Unknown   Promethazine Hcl Hives   Tegaderm Ag Mesh [Silver] Rash    Old formulation only, is able to tolerate new formulation    Past Medical History:  Diagnosis Date   Addison's disease ([place])    Adrenal insufficiency (HCC)    Anemia    Anxiety    Aortic stenosis    Aortic stenosis    Appendicitis 12/19/09   Appendicitis    Breast cancer (HCC)    STATUS POST BILATERAL MASTECTOMY. STATUS POST RECONSTRUCTION. SHE HAD SILICONE BREAST IMPLANTS AND THE LEFT IMPLANT IS LEAKING SLIGHTLY   Cellulitis of right middle finger 11/07/2018   Cervical cancer ([place]) 12/23/2018   Chest pain    CHF with right heart failure ([place]) 04/17/2017   Chronic respiratory failure with hypoxia (HCC) 12/23/2018   Cough variant asthma 04/13/2019   Depression    GERD (gastroesophageal reflux disease)    takes Dexilant and carafate and gi coctail    Headache    migraines on a daily and monthly  regimen    Heart murmur    History of kidney stones    Hodgkin lymphoma (HCC)    STATUS POST MANTLE RADIATION   Hodgkin's  lymphoma ([place])    1987   Hypertension    Hypoxia    Necrotizing fasciitis ([place]) 12/23/2018   Non-ischemic cardiomyopathy (HCC)    Osteoporosis    Palpitations    Pituitary adenoma ([place]) 12/23/2018   Pneumonia    PONV (postoperative nausea and vomiting)    Pre-diabetes    per pt; no meds   Pulmonary hypertension ([place]) 12/23/2018   Raynaud phenomenon    Right heart failure ([place]) 04/17/2017   Seizures ([place])    last febrile seizure was approx 3 weeks ago per report on 12/01/2020   Sleep apnea    upcoming sleep study per pt    Supplemental oxygen dependent    3 liters   SVT (supraventricular tachycardia) (HCC)    Tachycardia    Thyroid cancer ([place])    STATUS POST SURGICAL REMOVAL-CURRENT ON THYROID REPLACEMENT    Family History  Family history unknown: Yes    Social History   Socioeconomic History   Marital status: Married    Spouse name: Not on file   Number of children: Not on file   Years of education: Not on file   Highest education level: Not on file  Occupational History   Not on file  Tobacco Use   Smoking status: Never   Smokeless tobacco: Never  Vaping Use   Vaping Use: Never used  Substance and Sexual Activity   Alcohol use: Not Currently    Comment: social    Drug use: No   Sexual activity: Yes    Birth control/protection: Surgical  Other Topics Concern   Not on file  Social History Narrative   Not on file   Social Determinants of Health   Financial Resource Strain: Not on file  Food Insecurity: Not on file  Transportation Needs: Not on file  Physical Activity: Not on file  Stress: Not on file  Social Connections: Not on file  Intimate Partner Violence: Not on file    Past Medical History, Surgical history, Social history, and Family history were reviewed and updated as appropriate.   Please see review of systems for further details on the patient's review from today.   Objective:   Physical Exam:  LMP  (LMP Unknown)   Physical  Exam Constitutional:      General: She is not in acute distress. Musculoskeletal:        General: No deformity.  Neurological:     Mental Status: She is alert and oriented to person, place, and time.     Coordination: Coordination normal.  Psychiatric:        Attention and Perception: Attention and perception normal. She does not perceive auditory or visual hallucinations.        Mood and Affect: Mood normal. Mood is not anxious or depressed. Affect is not labile, blunt, angry or inappropriate.        Speech: Speech normal.        Behavior: Behavior normal.        Thought Content: Thought content normal. Thought content is not paranoid or delusional. Thought content does not include homicidal or suicidal ideation. Thought content does not include homicidal or suicidal plan.        Cognition and Memory: Cognition and memory normal.        Judgment: Judgment normal.  Comments: Insight intact    Lab Review:     Component Value Date/Time   NA 139 11/23/2021 1222   NA 141 04/01/2018 1617   K 3.1 (L) 11/23/2021 1222   CL 101 11/23/2021 1222   CO2 28 11/23/2021 1222   GLUCOSE 147 (H) 11/23/2021 1222   BUN 25 (H) 11/23/2021 1222   BUN 11 04/01/2018 1617   CREATININE 1.19 (H) 11/23/2021 1222   CALCIUM 9.1 11/23/2021 1222   PROT 7.9 11/23/2021 1222   PROT 7.1 01/23/2018 1225   ALBUMIN 4.3 11/23/2021 1222   ALBUMIN 4.4 01/23/2018 1225   AST 23 11/23/2021 1222   ALT 34 11/23/2021 1222   ALKPHOS 95 11/23/2021 1222   BILITOT 0.4 11/23/2021 1222   GFRNONAA 56 (L) 11/23/2021 1222   GFRAA >60 04/27/2020 0613   GFRAA >60 02/10/2020 1020       Component Value Date/Time   WBC 8.1 11/23/2021 1222   WBC 8.5 12/09/2020 0530   RBC 4.73 11/23/2021 1222   HGB 11.5 (L) 11/23/2021 1222   HGB 12.0 12/21/2019 1418   HCT 37.4 11/23/2021 1222   HCT 39.9 04/01/2018 1617   PLT 392 11/23/2021 1222   PLT 327 04/01/2018 1617   MCV 79.1 (L) 11/23/2021 1222   MCV 89 04/01/2018 1617   MCH 24.3  (L) 11/23/2021 1222   MCHC 30.7 11/23/2021 1222   RDW 17.5 (H) 11/23/2021 1222   RDW 13.6 04/01/2018 1617   LYMPHSABS 1.7 11/23/2021 1222   MONOABS 0.8 11/23/2021 1222   EOSABS 0.1 11/23/2021 1222   BASOSABS 0.1 11/23/2021 1222    No results found for: POCLITH, LITHIUM   No results found for: PHENYTOIN, PHENOBARB, VALPROATE, CBMZ   .res Assessment: Plan:    Plan:  PDMP reviewed  Lexapro 42m daily Xanax 0.518mBID Wellbutrin XL 15054mvery morning  Increase Lamictal 82m48m 50mg76mly - taking the 82mg 41mently  Discussed potential benefits, risk, and side effects of benzodiazepines to include potential risk of tolerance and dependence, as well as possible drowsiness.  Advised patient not to drive if experiencing drowsiness and to take lowest possible effective dose to minimize risk of dependence and tolerance.   Counseled patient regarding potential benefits, risks, and side effects of Lamictal to include potential risk of Stevens-Johnson syndrome. Advised patient to stop taking Lamictal and contact office immediately if rash develops and to seek urgent medical attention if rash is severe and/or spreading quickly. Set up with therapist  RTC 4 weeks  Patient advised to contact office with any questions, adverse effects, or acute worsening in signs and symptoms.  Diagnoses and all orders for this visit:  Major depressive disorder, recurrent episode, moderate (HCC)  Generalized anxiety disorder  Insomnia, unspecified type  Panic attacks     Please see After Visit Summary for patient specific instructions.  Future Appointments  Date Time Provider DepartAnon Raices2023  1:45 PM SniderCarlyle BasquesCID-[facility]ID [facility]  12/19/2021 11:00 AM Dowd, Shanon Ace CP-CP None  12/28/2021 11:45 AM [facility] None  02/08/2022 11:45 AM [facility] [facility] None  03/22/2022 11:45 AM [facility] [place] [facility] None  04/19/2022 11:00 AM [facility]  LAB [facility] None  04/19/2022 11:30 AM G[facility]enaNicholas LoseHCC-MEDONC None  05/03/2022 11:45 AM [facility] [facility] None  06/14/2022 11:45 AM [facility] [facility] None  07/26/2022 11:45 AM [facility] [facility] None  09/06/2022 11:45 AM [facility] [facility] None  10/18/2022 11:45 AM  [place] FLUSH [facility] None  11/29/2022 11:45 AM [facility] [place] FLUSH [facility] None    No orders of the defined types were placed in this encounter.     -------------------------------

## 2021-11-29 NOTE — Telephone Encounter (Signed)
Called Dr Baxter Flattery office, left message regarding pt contacting us with questions for port removal and replacement.  Included in my VM that we had not decided to proceed with this route and that since I.D. was treating her as a result of her blood cultures that a port removal and replacement would be at their discretion.  We are seeing pt for q6wk port flushes and instructed her to not access her port at home.  Advised staff to please call me back with any additional questions or concerns.   ?

## 2021-11-30 ENCOUNTER — Other Ambulatory Visit: Payer: Self-pay | Admitting: Internal Medicine

## 2021-11-30 ENCOUNTER — Other Ambulatory Visit (HOSPITAL_COMMUNITY): Payer: Self-pay

## 2021-11-30 DIAGNOSIS — R7881 Bacteremia: Secondary | ICD-10-CM | POA: Diagnosis not present

## 2021-11-30 DIAGNOSIS — A327 Listerial sepsis: Secondary | ICD-10-CM | POA: Diagnosis not present

## 2021-11-30 DIAGNOSIS — Z9889 Other specified postprocedural states: Secondary | ICD-10-CM

## 2021-11-30 MED ORDER — SULFAMETHOXAZOLE-TRIMETHOPRIM 800-160 MG PO TABS
1.0000 | ORAL_TABLET | Freq: Two times a day (BID) | ORAL | 0 refills | Status: DC
  Filled 2021-11-30: qty 10, 5d supply, fill #0

## 2021-11-30 NOTE — Progress Notes (Signed)
Will plan to get [name] to change out port. Will have her stop iv abtx, and give 5 days of bactrim ds ?

## 2021-12-01 ENCOUNTER — Other Ambulatory Visit (HOSPITAL_COMMUNITY): Payer: Self-pay | Admitting: Physician Assistant

## 2021-12-01 DIAGNOSIS — R7881 Bacteremia: Secondary | ICD-10-CM | POA: Diagnosis not present

## 2021-12-01 DIAGNOSIS — A327 Listerial sepsis: Secondary | ICD-10-CM | POA: Diagnosis not present

## 2021-12-03 ENCOUNTER — Other Ambulatory Visit: Payer: Self-pay | Admitting: Radiology

## 2021-12-04 ENCOUNTER — Encounter (HOSPITAL_COMMUNITY): Payer: Self-pay

## 2021-12-04 ENCOUNTER — Ambulatory Visit (HOSPITAL_COMMUNITY)
Admission: RE | Admit: 2021-12-04 | Discharge: 2021-12-04 | Disposition: A | Discharge status: Home / Self Care | Payer: BC Managed Care – PPO | Source: Ambulatory Visit | Attending: Internal Medicine | Admitting: Internal Medicine

## 2021-12-04 ENCOUNTER — Other Ambulatory Visit: Payer: Self-pay

## 2021-12-04 ENCOUNTER — Other Ambulatory Visit: Payer: Self-pay | Admitting: Internal Medicine

## 2021-12-04 DIAGNOSIS — I428 Other cardiomyopathies: Secondary | ICD-10-CM | POA: Insufficient documentation

## 2021-12-04 DIAGNOSIS — I110 Hypertensive heart disease with heart failure: Secondary | ICD-10-CM | POA: Insufficient documentation

## 2021-12-04 DIAGNOSIS — Z7982 Long term (current) use of aspirin: Secondary | ICD-10-CM | POA: Insufficient documentation

## 2021-12-04 DIAGNOSIS — I471 Supraventricular tachycardia: Secondary | ICD-10-CM | POA: Diagnosis not present

## 2021-12-04 DIAGNOSIS — Y848 Other medical procedures as the cause of abnormal reaction of the patient, or of later complication, without mention of misadventure at the time of the procedure: Secondary | ICD-10-CM | POA: Diagnosis not present

## 2021-12-04 DIAGNOSIS — E271 Primary adrenocortical insufficiency: Secondary | ICD-10-CM | POA: Diagnosis not present

## 2021-12-04 DIAGNOSIS — T82598A Other mechanical complication of other cardiac and vascular devices and implants, initial encounter: Secondary | ICD-10-CM | POA: Diagnosis not present

## 2021-12-04 DIAGNOSIS — Z9013 Acquired absence of bilateral breasts and nipples: Secondary | ICD-10-CM | POA: Diagnosis not present

## 2021-12-04 DIAGNOSIS — I509 Heart failure, unspecified: Secondary | ICD-10-CM | POA: Insufficient documentation

## 2021-12-04 DIAGNOSIS — Z452 Encounter for adjustment and management of vascular access device: Secondary | ICD-10-CM | POA: Diagnosis not present

## 2021-12-04 DIAGNOSIS — Z79899 Other long term (current) drug therapy: Secondary | ICD-10-CM | POA: Diagnosis not present

## 2021-12-04 DIAGNOSIS — E2740 Unspecified adrenocortical insufficiency: Secondary | ICD-10-CM | POA: Diagnosis not present

## 2021-12-04 DIAGNOSIS — Z853 Personal history of malignant neoplasm of breast: Secondary | ICD-10-CM | POA: Diagnosis not present

## 2021-12-04 DIAGNOSIS — Z79890 Hormone replacement therapy (postmenopausal): Secondary | ICD-10-CM | POA: Diagnosis not present

## 2021-12-04 DIAGNOSIS — Z8541 Personal history of malignant neoplasm of cervix uteri: Secondary | ICD-10-CM | POA: Diagnosis not present

## 2021-12-04 DIAGNOSIS — Z8572 Personal history of non-Hodgkin lymphomas: Secondary | ICD-10-CM | POA: Diagnosis not present

## 2021-12-04 DIAGNOSIS — K219 Gastro-esophageal reflux disease without esophagitis: Secondary | ICD-10-CM | POA: Diagnosis not present

## 2021-12-04 DIAGNOSIS — Z85850 Personal history of malignant neoplasm of thyroid: Secondary | ICD-10-CM | POA: Insufficient documentation

## 2021-12-04 DIAGNOSIS — Z9889 Other specified postprocedural states: Secondary | ICD-10-CM

## 2021-12-04 DIAGNOSIS — T82514A Breakdown (mechanical) of infusion catheter, initial encounter: Secondary | ICD-10-CM | POA: Diagnosis not present

## 2021-12-04 HISTORY — PX: IR IMAGING GUIDED PORT INSERTION: IMG5740

## 2021-12-04 HISTORY — PX: IR REMOVAL TUN ACCESS W/ PORT W/O FL MOD SED: IMG2290

## 2021-12-04 MED ORDER — FENTANYL CITRATE (PF) 100 MCG/2ML IJ SOLN
INTRAMUSCULAR | Status: AC
  Filled 2021-12-04: qty 2

## 2021-12-04 MED ORDER — MIDAZOLAM HCL 2 MG/2ML IJ SOLN
INTRAMUSCULAR | Status: AC | PRN
  Administered 2021-12-04: .5 mg via INTRAVENOUS
  Administered 2021-12-04 (×3): 1 mg via INTRAVENOUS
  Administered 2021-12-04: .5 mg via INTRAVENOUS

## 2021-12-04 MED ORDER — LIDOCAINE-EPINEPHRINE 1 %-1:100000 IJ SOLN
INTRAMUSCULAR | Status: AC
  Filled 2021-12-04: qty 1

## 2021-12-04 MED ORDER — MIDAZOLAM HCL 2 MG/2ML IJ SOLN
INTRAMUSCULAR | Status: AC
  Filled 2021-12-04: qty 2

## 2021-12-04 MED ORDER — HEPARIN SODIUM (PORCINE) 1000 UNIT/ML IJ SOLN
INTRAMUSCULAR | Status: AC | PRN
  Administered 2021-12-04: 5000 [IU] via INTRAVENOUS

## 2021-12-04 MED ORDER — HEPARIN SOD (PORK) LOCK FLUSH 100 UNIT/ML IV SOLN
INTRAVENOUS | Status: AC
  Filled 2021-12-04: qty 5

## 2021-12-04 MED ORDER — SODIUM CHLORIDE 0.9 % IV SOLN: INTRAVENOUS | Status: DC

## 2021-12-04 MED ORDER — FENTANYL CITRATE (PF) 100 MCG/2ML IJ SOLN
INTRAMUSCULAR | Status: AC | PRN
  Administered 2021-12-04 (×4): 50 ug via INTRAVENOUS

## 2021-12-04 NOTE — H&P (Signed)
Chief Complaint: Patient was seen in consultation today for [facility] a cath removal and new placement at the request of [place]  Referring Physician(s): [place]  Supervising Physician: Arne Cleveland  Patient Status: [facility] - Out-pt  History of Present Illness: [name] is a 51 y.o. female   Well known to IR Pt with multiple malignancies Breast Ca; Cervical cancer; Thyroid cancer; Hodgkin's Lymphoma Poor venous access Using PAC for blood draws; fluids; meds for variety of conditions  The Port in now was placed 05/06/2020 in IR Has functioned well til just 10-14 days ago Noted leakage at skin site and development of small "hole" at access site  Last use 1 week ago [facility] + for staph 11/23/21 Now on Bactrim po per Dr Graylon Good  Request now for [facility] removal and new placement  Past Medical History:  Diagnosis Date   Addison's disease ([place])    Adrenal insufficiency ([place])    Anemia    Anxiety    Aortic stenosis    Aortic stenosis    Appendicitis 12/19/09   Appendicitis    Breast cancer ([place])    STATUS POST BILATERAL MASTECTOMY. STATUS POST RECONSTRUCTION. SHE HAD SILICONE BREAST IMPLANTS AND THE LEFT IMPLANT IS LEAKING SLIGHTLY   Cellulitis of right middle finger 11/07/2018   Cervical cancer ([place]) 12/23/2018   Chest pain    CHF with right heart failure ([place]) 04/17/2017   Chronic respiratory failure with hypoxia (HCC) 12/23/2018   Cough variant asthma 04/13/2019   Depression    GERD (gastroesophageal reflux disease)    takes Dexilant and carafate and gi coctail    Headache    migraines on a daily and monthly regimen    Heart murmur    History of kidney stones    Hodgkin lymphoma ([place])    STATUS POST MANTLE RADIATION   Hodgkin's lymphoma ([place])    1987   Hypertension    Hypoxia    Necrotizing fasciitis ([place]) 12/23/2018   Non-ischemic cardiomyopathy ([place])    Osteoporosis    Palpitations    Pituitary adenoma ([place]) 12/23/2018   Pneumonia    PONV (postoperative  nausea and vomiting)    Pre-diabetes    per pt; no meds   Pulmonary hypertension ([place]) 12/23/2018   Raynaud phenomenon    Right heart failure ([place]) 04/17/2017   Seizures ([place])    last febrile seizure was approx 3 weeks ago per report on 12/01/2020   Sleep apnea    upcoming sleep study per pt    Supplemental oxygen dependent    3 liters   SVT (supraventricular tachycardia) (HCC)    Tachycardia    Thyroid cancer ([place])    STATUS POST SURGICAL REMOVAL-CURRENT ON THYROID REPLACEMENT    Past Surgical History:  Procedure Laterality Date   ABDOMINAL HYSTERECTOMY     AMPUTATION Left 01/30/2019   Procedure: Left Index finger amputation with flap reconstruction and repair reconstruction;  Surgeon: Roseanne Kaufman, MD;  Location: [place];  Service: Orthopedics;  Laterality: Left;   APPENDECTOMY     breast implants and removal      breast implants but leaking      CARDIAC CATHETERIZATION  05/18/09   NORMAL CATH   COLONOSCOPY     hx of chemotherapy      hx of radiation therapy      I & D EXTREMITY Left 12/23/2018   Procedure: IRRIGATION AND DEBRIDEMENT HAND / INDEX FINGER;  Surgeon: Roseanne Kaufman, MD;  Location: [place];  Service: Orthopedics;  Laterality: Left;   IR IMAGING GUIDED PORT INSERTION  05/06/2020   IR REMOVAL TUN ACCESS W/ PORT W/O FL MOD SED  04/27/2020   KIDNEY STONE SURGERY     LUMBAR PUNCTURE W/ INTRATHECAL CHEMOTHERAPY     MASTECTOMY     PITUITARY SURGERY     RIGHT/LEFT HEART CATH AND CORONARY ANGIOGRAPHY N/A 04/02/2018   Procedure: RIGHT/LEFT HEART CATH AND CORONARY ANGIOGRAPHY;  Surgeon: Burnell Blanks, MD;  Location: [place] [facility];  Service: Cardiovascular;  Laterality: N/A;   TOOTH EXTRACTION N/A 12/05/2020   Procedure: DENTAL RESTORATION/EXTRACTIONS;  Surgeon: Ronal Fear, MD;  Location: [facility];  Service: Oral Surgery;  Laterality: N/A;   TOTAL THYROIDECTOMY     VIDEO BRONCHOSCOPY Bilateral 11/14/2018   Procedure: VIDEO BRONCHOSCOPY WITHOUT FLUORO;   Surgeon: Margaretha Seeds, MD;  Location: [place];  Service: Cardiopulmonary;  Laterality: Bilateral;   VIDEO BRONCHOSCOPY WITH ENDOBRONCHIAL ULTRASOUND N/A 11/19/2018   Procedure: VIDEO BRONCHOSCOPY WITH ENDOBRONCHIAL ULTRASOUND;  Surgeon: Margaretha Seeds, MD;  Location: [place];  Service: Thoracic;  Laterality: N/A;    Allergies: Ferrous bisglycinate chelate [iron], Mushroom extract complex, Na ferric gluc cplx in sucrose, Cymbalta [duloxetine hcl], Phenergan [promethazine], Succinylcholine, Buprenorphine hcl, Compazine, Metoclopramide, Morphine and related, Ondansetron, Promethazine hcl, and Tegaderm ag mesh [silver]  Medications: Prior to Admission medications   Medication Sig Start Date End Date Taking? Authorizing Provider  albuterol (2.5 MG/3ML) 0.083% NEBU 3 mL, albuterol (5 MG/ML) 0.5% NEBU 0.5 mL Inhale 3 mg into the lungs every 6 (six) hours as needed (SOB).     [provider]  albuterol (PROVENTIL) (2.5 MG/3ML) 0.083% nebulizer solution Take 3 mLs (2.5 mg total) by nebulization every 6 (six) hours as needed for wheezing or shortness of breath. 03/27/21 06/16/21  Magdalen Spatz, NP  albuterol (VENTOLIN HFA) 108 (90 Base) MCG/ACT inhaler Inhale 2 puffs into the lungs every 6 (six) hours as needed for wheezing or shortness of breath.    [provider]  ALPRAZolam Duanne Moron) 0.5 MG tablet Take 1 tablet (0.5 mg total) by mouth 2 (two) times daily as needed for anxiety. 11/08/21   Mozingo, Berdie Ogren, NP  alum & mag hydroxide-simeth (MAALOX/MYLANTA) 200-200-20 MG/5ML suspension Take 30 mLs by mouth daily as needed for indigestion or heartburn. For Bleeding Ulcers    [provider]  aspirin 325 MG tablet Take 325 mg by mouth at bedtime.     [provider]  beclomethasone (QVAR) 80 MCG/ACT inhaler Inhale 1 puff into the lungs 2 (two) times daily.     [provider]  beclomethasone (QVAR) 80 MCG/ACT inhaler Inhale 1 puff into the lungs 2 (two)  times daily. 04/17/21   Margaretha Seeds, MD  benzonatate (TESSALON) 200 MG capsule Take 1 capsule (200 mg total) by mouth 3 (three) times daily as needed for cough. 04/15/19   Martyn Ehrich, NP  BREO ELLIPTA 200-25 MCG/ACT AEPB TAKE 1 PUFF BY MOUTH EVERY DAY 09/26/21   Margaretha Seeds, MD  bumetanide (BUMEX) 1 MG tablet TAKE 1 TABLET BY MOUTH TWICE A DAY 07/17/21   Nicholas Lose, MD  buPROPion (WELLBUTRIN XL) 150 MG 24 hr tablet Take 1 tablet (150 mg total) by mouth daily. 11/08/21   Mozingo, Berdie Ogren, NP  cyclobenzaprine (FLEXERIL) 5 MG tablet Take 1 tablet (5 mg total) by mouth 3 (three) times daily as needed for muscle spasms. 11/01/21   Blue, Soijett A, PA-C  dexlansoprazole (DEXILANT) 60 MG capsule Take  60 mg by mouth daily.    [provider]  Diclofenac Potassium,Migraine, (CAMBIA) 50 MG PACK Take 1 packet by mouth as needed for migraine. 05/16/21   [provider]  diphenhydrAMINE (BENADRYL) 50 MG/ML injection Inject 1 mL (50 mg total) into the vein every 6 (six) hours as needed (nausea). 12/25/18   Rai, Ripudeep K, MD  dronabinol (MARINOL) 2.5 MG capsule TAKE 1 CAPSULE BY MOUTH TWICE DAILY AS NEEDED 11/24/21   Nicholas Lose, MD  EPINEPHrine 0.3 mg/0.3 mL IJ SOAJ injection Inject 0.3 mg into the muscle as needed for anaphylaxis. 06/13/21   Margaretha Seeds, MD  escitalopram (LEXAPRO) 20 MG tablet Take 1 tablet (20 mg total) by mouth every evening. 11/08/21   Mozingo, Berdie Ogren, NP  fluconazole (DIFLUCAN) 150 MG tablet Take 1 tablet (150 mg total) by mouth daily. If needed for yeast infection 11/24/21   Carlyle Basques, MD  Galcanezumab-gnlm 120 MG/ML SOAJ Inject 120 mg into the skin every 28 (twenty-eight) days.  03/31/19   [provider]  heparin lock flush 100 UNIT/ML SOLN injection USE 5 MLS AS A HEPLOCK IN PORT A CATH ONCE DAILY AFTER MEDICATION ADMINISTRATION OR AS DIRECTED BY PHYSICIAN. 10/30/21 10/30/22  Nicholas Lose, MD  hydrocortisone (CORTEF) 10 MG  tablet Take 1-2 tablets (10-20 mg total) by mouth See admin instructions. Take 20 mg in the am and 59m in the evening Patient taking differently: Take 10-20 mg by mouth See admin instructions. Take 20 mg by mouth in the morning and 132min the evening 02/17/20   KyCherylann Ratel, DO  lamoTRIgine (LAMICTAL) 25 MG tablet Take one tablet every night at bedtime for 2 weeks, then increase to two tablets at bedtime. 11/08/21   Mozingo, ReBerdie OgrenNP  Lancets (OP[facility] Of VenturaELICA PLUS [id number] 3 (three) times daily. for testing 06/12/21   [provider]  levothyroxine (SYNTHROID, LEVOTHROID) 112 MCG tablet Take 112 mcg by mouth daily before breakfast.    [provider]  LORazepam (ATIVAN) 0.5 MG tablet TAKE 1 TABLET BY MOUTH TWICE A DAY AS NEEDED FOR ANXIETY 11/24/21   GuNicholas LoseMD  metoprolol succinate (TOPROL-XL) 25 MG 24 hr tablet Take 37.5 mg by mouth daily.     [provider]  [facility] Hospital[facility] test strip 3 (three) times daily. for testing 06/12/21   [provider]  OXYGEN Inhale 3-4 L into the lungs continuous.    [provider]  potassium chloride (KLOR-CON) 10 MEQ tablet Take 1 tablet (10 mEq total) by mouth daily for 21 days. 07/15/20 08/05/20  PaAlfredia ClientPA-C  potassium chloride (KLOR-CON) 10 MEQ tablet Take 10 mEq by mouth daily as needed (low potassium).    [provider]  predniSONE (DELTASONE) 10 MG tablet Take 4 tabs x 2 days, then 3 x 2d, then 2 x 2d, then 1 x 2d and STOP 03/27/21   GrMagdalen SpatzNP  Rimegepant Sulfate (NURTEC) 75 MG TBDP Take 75 mg by mouth daily as needed (Migraine).     [provider]  rosuvastatin (CRESTOR) 10 MG tablet Take 10 mg by mouth daily.  02/28/18   [provider]  silver sulfADIAZINE (SILVADENE) 1 % cream Apply 1 application topically daily as needed (Open Wound).  11/16/19   [provider]  sitaGLIPtin (JANUVIA) 100 MG tablet Take 1 tablet (100 mg total) by mouth  daily. 10/19/21   GuNicholas LoseMD  Sodium Chloride Flush (NORMAL SALINE FLUSH) 0.9 % SOLN FLUSH  WITH TWO 10 ML FLUSHES IN PORT BEFORE AND AFTER FLUIDS AND MEDICATION ADMINISTRATION, TO MAINTAIN PATENCY--USE UP TO 4 TIMES DAILY. 10/30/21 10/30/22  Nicholas Lose, MD  sucralfate (CARAFATE) 1 g tablet Take 1 g by mouth daily as needed (FOR ULCERS).     [provider]  sulfamethoxazole-trimethoprim (BACTRIM DS) 800-160 MG tablet Take 1 tablet by mouth 2 (two) times daily. 11/30/21   Carlyle Basques, MD  SYNTHROID 100 MCG tablet Take 1 tablet (100 mcg total) by mouth every morning. 10/19/21   Nicholas Lose, MD  topiramate (TOPAMAX) 50 MG tablet Take 150 mg by mouth at bedtime.  12/25/19   [provider]     Family History  Family history unknown: Yes    Social History   Socioeconomic History   Marital status: Married    Spouse name: Not on file   Number of children: Not on file   Years of education: Not on file   Highest education level: Not on file  Occupational History   Not on file  Tobacco Use   Smoking status: Never   Smokeless tobacco: Never  Vaping Use   Vaping Use: Never used  Substance and Sexual Activity   Alcohol use: Not Currently    Comment: social    Drug use: No   Sexual activity: Yes    Birth control/protection: Surgical  Other Topics Concern   Not on file  Social History Narrative   Not on file   Social Determinants of Health   Financial Resource Strain: Not on file  Food Insecurity: Not on file  Transportation Needs: Not on file  Physical Activity: Not on file  Stress: Not on file  Social Connections: Not on file    Review of Systems: A 12 point ROS discussed and pertinent positives are indicated in the HPI above.  All other systems are negative.  Review of Systems  Constitutional:  Negative for activity change, fatigue and fever.  Respiratory:  Negative for cough and shortness of breath.   Cardiovascular:  Negative for chest pain.   Gastrointestinal:  Negative for nausea and vomiting.  Psychiatric/Behavioral:  Negative for behavioral problems and confusion.    Vital Signs: BP 125/80    Pulse 99    Temp 98.4 F (36.9 C) (Oral)    Resp 18    Ht _0  (1.651 m)    Wt 183 lb (83 kg)    LMP  (LMP Unknown)    SpO2 98%    BMI 30.45 kg/m   Physical Exam Vitals reviewed.  HENT:     Mouth/Throat:     Mouth: Mucous membranes are moist.  Cardiovascular:     Rate and Rhythm: Normal rate and regular rhythm.     Heart sounds: Normal heart sounds.  Pulmonary:     Effort: Pulmonary effort is normal.     Breath sounds: Normal breath sounds.  Abdominal:     Palpations: Abdomen is soft.     Tenderness: There is no abdominal tenderness.  Musculoskeletal:        General: Normal range of motion.  Skin:    General: Skin is warm.     Comments: Site of PAC has 22m "hole" noted Minimally tender No redness No pus No sign of infection  Neurological:     Mental Status: She is alert and oriented to person, place, and time.  Psychiatric:        Behavior: Behavior normal.    Imaging: No results found.  Labs:  CBC: Recent Labs    04/17/21 1035 10/19/21 0947 11/17/21 0959 11/23/21 1222  WBC 9.5 8.7 9.8 8.1  HGB 11.6* 11.6* 11.2* 11.5*  HCT 36.5 37.7 36.2 37.4  PLT 395 374 372 392    COAGS: No results for input(s): INR, APTT in the last 8760 hours.  BMP: Recent Labs    04/17/21 1035 10/19/21 0947 11/17/21 0959 11/23/21 1222  NA 141 140 139 139  K 3.3* 3.5 3.5 3.1*  CL 103 103 105 101  CO2 _0 GLUCOSE 144* 122* 118* 147*  BUN 19 23* 19 25*  CALCIUM 9.7 9.6 9.1 9.1  CREATININE 1.24* 1.14* 1.07* 1.19*  GFRNONAA 53* 59* >60 56*    LIVER FUNCTION TESTS: Recent Labs    04/17/21 1035 10/19/21 0947 11/17/21 0959 11/23/21 1222  BILITOT 0.3 0.3 0.4 0.4  AST _1 ALT 40 30 38 34  ALKPHOS 99 86 85 95  PROT 8.0 7.8 7.5 7.9  ALBUMIN 3.6 4.2 4.2 4.3    TUMOR MARKERS: No results for  input(s): AFPTM, CEA, CA199, CHROMGRNA in the last 8760 hours.  Assessment and Plan:  Multiple malignancies Poor venous access PAC placed 05/2020 in IR Using [facility] for fluids; access; blood draws; meds Now with malfunction and +BC 11/23/21 Afebrile; on Bactrim po per Dr Baxter Flattery Scheduled for [facility] removal and replacement Risks and benefits of image guided port-a-catheter placement was discussed with the patient including, but not limited to bleeding, infection, pneumothorax, or fibrin sheath development and need for additional procedures.  All of the patient's questions were answered, patient is agreeable to proceed. Consent signed and in chart.   Thank you for this interesting consult.  I greatly enjoyed meeting DRIANA DAZEY and look forward to participating in their care.  A copy of this report was sent to the requesting provider on this date.  Electronically Signed: Lavonia Drafts, PA-C 12/04/2021, 11:29 AM   I spent a total of 25 minutes    in face to face in clinical consultation, greater than 50% of which was counseling/coordinating care for [facility] removal and new placement

## 2021-12-04 NOTE — Sedation Documentation (Signed)
Following placement of new port and removing the draping to switch to the other side of the room patient appears to be having a panic attack.  Dr. Vernard Gambles stepped out of the room.  Patient given a cool washcloth and given 0.79m Versed to calm the patient.   ?

## 2021-12-04 NOTE — Procedures (Signed)
?  Procedure: R IJ port catheter placement ?L port catheter removal   ?EBL:   minimal ?Complications:  none immediate ? ?See full dictation in [facility]. ? ?D. Arne Cleveland MD ?Main # [phone] ?Pager  [phone] ?Mobile [phone] ?  ? ?

## 2021-12-07 ENCOUNTER — Other Ambulatory Visit: Payer: Self-pay

## 2021-12-07 ENCOUNTER — Telehealth: Payer: Self-pay

## 2021-12-07 ENCOUNTER — Ambulatory Visit (INDEPENDENT_AMBULATORY_CARE_PROVIDER_SITE_OTHER): Payer: BC Managed Care – PPO | Admitting: Internal Medicine

## 2021-12-07 DIAGNOSIS — B957 Other staphylococcus as the cause of diseases classified elsewhere: Secondary | ICD-10-CM | POA: Diagnosis not present

## 2021-12-07 DIAGNOSIS — Z9889 Other specified postprocedural states: Secondary | ICD-10-CM

## 2021-12-07 DIAGNOSIS — R7881 Bacteremia: Secondary | ICD-10-CM

## 2021-12-07 NOTE — Telephone Encounter (Signed)
Attempted to call patient regarding mychart message and appointment. Patient is not able to come in person today due to transportation issues. Per Dr. Baxter Flattery okay to convert appt to a phone visit.  ?Not able to reach her at this time. Left voicemail requesting call back if she is okay with doing phone visit. ?[name], RMA ? ?

## 2021-12-07 NOTE — Progress Notes (Signed)
Virtual Visit via Telephone Note ? ?I connected with [name] on 12/07/21 at  1:45 PM EST by telephone and verified that I am speaking with the correct person using two identifiers. ? ?Location: ?Patient: at home ?Provider: at hospital ?  ?I discussed the limitations, risks, security and privacy concerns of performing an evaluation and management service by telephone and the availability of in person appointments. I also discussed with the patient that there may be a patient responsible charge related to this service. The patient expressed understanding and agreed to proceed. ? ? ?History of Present Illness: ?Had her left sided portacath removed (due to dysfunction) last week and replaced with new portacath on the right. She reports that the area on the left chest is healing slow. ? ?Also finished 5 days of oral abtx for staph epi bacteremia (from port-now improved.. but clinical was not symptomatic--suspect colonization of port) ?  ?Observations/Objective: ?Fluid speech ? ?Assessment and Plan: ? No need for further abtx ?New port in place for her vascular access needs ?Follow Up Instructions: ?Rtc as needed ?  ?I discussed the assessment and treatment plan with the patient. The patient was provided an opportunity to ask questions and all were answered. The patient agreed with the plan and demonstrated an understanding of the instructions. ?  ?The patient was advised to call back or seek an in-person evaluation if the symptoms worsen or if the condition fails to improve as anticipated. ? ?I provided 10 minutes of non-face-to-face time during this encounter. ? ? ?Carlyle Basques, MD  ?

## 2021-12-08 ENCOUNTER — Other Ambulatory Visit (HOSPITAL_COMMUNITY): Payer: Self-pay

## 2021-12-19 ENCOUNTER — Ambulatory Visit: Payer: Medicare Other | Admitting: Psychiatry

## 2021-12-25 ENCOUNTER — Other Ambulatory Visit: Payer: Self-pay | Admitting: Hematology and Oncology

## 2021-12-26 ENCOUNTER — Other Ambulatory Visit: Payer: Self-pay | Admitting: Hematology and Oncology

## 2021-12-26 ENCOUNTER — Ambulatory Visit: Payer: BC Managed Care – PPO | Admitting: Internal Medicine

## 2021-12-26 NOTE — Telephone Encounter (Signed)
?

## 2021-12-27 ENCOUNTER — Other Ambulatory Visit: Payer: Self-pay | Admitting: Hematology and Oncology

## 2021-12-27 ENCOUNTER — Other Ambulatory Visit (HOSPITAL_COMMUNITY): Payer: Self-pay

## 2021-12-27 NOTE — Telephone Encounter (Signed)
Refill needs to come from surgeon. Gardiner Rhyme, RN ? ?

## 2021-12-28 ENCOUNTER — Other Ambulatory Visit: Payer: Self-pay

## 2021-12-28 ENCOUNTER — Other Ambulatory Visit (HOSPITAL_COMMUNITY): Payer: Self-pay

## 2021-12-28 ENCOUNTER — Inpatient Hospital Stay: Payer: BC Managed Care – PPO | Attending: Hematology and Oncology

## 2021-12-28 DIAGNOSIS — Z86000 Personal history of in-situ neoplasm of breast: Secondary | ICD-10-CM | POA: Diagnosis not present

## 2021-12-28 DIAGNOSIS — Z452 Encounter for adjustment and management of vascular access device: Secondary | ICD-10-CM | POA: Insufficient documentation

## 2021-12-28 DIAGNOSIS — Z95828 Presence of other vascular implants and grafts: Secondary | ICD-10-CM

## 2021-12-28 MED ORDER — SODIUM CHLORIDE 0.9% FLUSH
10.0000 mL | INTRAVENOUS | Status: DC | PRN
  Administered 2021-12-28: 10 mL

## 2021-12-28 MED ORDER — HEPARIN SOD (PORK) LOCK FLUSH 100 UNIT/ML IV SOLN
500.0000 [IU] | Freq: Once | INTRAVENOUS | Status: AC | PRN
  Administered 2021-12-28: 500 [IU]

## 2021-12-29 ENCOUNTER — Telehealth: Payer: Self-pay

## 2021-12-29 NOTE — Telephone Encounter (Signed)
Was notified by pharmacy tech Estill Bamberg that pt had phoned in requesting saline and heparin flushes.  Per Dr Lindi Adie pt has been instructed that we will no longer refill these for her as she is not supposed to be accessing her port at home.  Pt has been educated on importance of sterile procedures done in office to decrease chance of infection.  Orders have previously been placed and appointments scheduled for this ? ?I called and spoke with Oletta Lamas to inform her of the above referenced information.  Pt continued to ask for these supplies as she says she has a home health agency coming out.  I advised her that the home health RN would contact us for needed supplies and we have not had a request.  I asked what agency she was working with so that I could call them to clear things up and pt did not know the name of the agency or the RN she is working with.  Pt states she is paying out of pocket for someone to come to her home and help her.  I advised her that we will flush her port q6wk here at the cancer center as the home health situation does not seem to be very clear.  Pt states she will reach back out and inform us of the name for the [facility] agency.  Pt also states that she has transportation issues, however, she also states she has several upcoming appointments at [facility] that she is not concerned about getting to.  I shared with pt that I would like to help her but I am not clear on how I can best assist her beyond what we have already set up for her.  Pt is additionally now asking for wound care supplies, medications, and sterile saline stating she uses this 'when wounds appear' I advised pt to provide more context of her wounds and she says she does not have a current wound to treat.  Pt was instructed to please provide [facility] agency name for follow up and return to our office for flushes of her port.  Pt verbalized understanding and thanks  ?

## 2022-01-18 DIAGNOSIS — G4700 Insomnia, unspecified: Secondary | ICD-10-CM | POA: Diagnosis not present

## 2022-01-18 DIAGNOSIS — E782 Mixed hyperlipidemia: Secondary | ICD-10-CM | POA: Diagnosis not present

## 2022-01-18 DIAGNOSIS — F419 Anxiety disorder, unspecified: Secondary | ICD-10-CM | POA: Diagnosis not present

## 2022-01-18 DIAGNOSIS — K921 Melena: Secondary | ICD-10-CM | POA: Diagnosis not present

## 2022-01-25 ENCOUNTER — Encounter: Payer: Self-pay | Admitting: Hematology and Oncology

## 2022-02-02 ENCOUNTER — Encounter: Payer: Self-pay | Admitting: Pulmonary Disease

## 2022-02-02 ENCOUNTER — Ambulatory Visit (INDEPENDENT_AMBULATORY_CARE_PROVIDER_SITE_OTHER): Payer: BC Managed Care – PPO | Admitting: Pulmonary Disease

## 2022-02-02 VITALS — BP 118/72 | HR 96 | Temp 98.2°F | Ht 65.0 in | Wt 185.2 lb

## 2022-02-02 DIAGNOSIS — Z7952 Long term (current) use of systemic steroids: Secondary | ICD-10-CM | POA: Diagnosis not present

## 2022-02-02 DIAGNOSIS — J4550 Severe persistent asthma, uncomplicated: Secondary | ICD-10-CM | POA: Diagnosis not present

## 2022-02-02 MED ORDER — FLUTICASONE-SALMETEROL 230-21 MCG/ACT IN AERO: 2.0000 | INHALATION_SPRAY | Freq: Two times a day (BID) | RESPIRATORY_TRACT | 5 refills | Status: DC

## 2022-02-02 NOTE — Progress Notes (Signed)
? ? ?Subjective:  ? ?PATIENT ID: [name] GENDER: female DOB: [birth year], MRN: [id number] ? ? ?HPI ? ?Chief Complaint  ?Patient presents with  ? Follow-up  ?  Pt states her asthma is getting worse since last visit. Pt states she feels like her inhalers are not helping her as much anymore with her breathing.   ? ?Reason for Visit: Follow-up ? ?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 ? ?Synopsis: ?Previously followed at [facility] for cough variant asthma, subcentimeter lung nodules (stable) and chronic hypoxemia of unknown etiology. Patient had previously had concerns for pulmonary fibrosis however after no evidence of parenchymal disease on chest imaging. ?2020 - Initially consulted with [facility] for hemoptysis. Work-up including bronchoscopy and CTA were negative and she has not had significant recurrence since then except mild episodes during exacerbations. Started on Nucala in 09/2019 for asthma however lost to follow-up from December 2020 to April 2021 ?2021 - Multiple hospitalizations including for kidney stone pain, migraine, bacteremia 2/2 port infection, LUE cellulitis ?2022 - Re-established care and scheduled to start Nucala however unable to start. Had COVID and received antibody infusion ? ?02/02/22 ?She was previously on Breo and Qvar. Compliant with her oxygen at 3-4L. She has dyspnea on exertion. She uses albuterol twice a day. We had attempted to restart Nucala last year but  unfortunately she had COVID in September and other medical conditions that prevented her from returning to clinic including port infection. Port has been replaced in March 2023. She has had decreased appetite due to lost/alteration of taste from COVID. ? ?Social History: ?Has two adult daughters ?Cares for her 23 yeard old son with atypical cerebral palsy ? ?Environmental exposures:  ?Hx chemotherapy ? ?CareEverywhere Records Reviewed and Summarized: ?Pulm at [place] ?     09/18/17 Bronchoscopy with BAL. Negative cultures and negative cytology. ?     Dr. Geraldo Docker documented on 01/31/18 that patient's chest imaging was presented in 10/2017 and her bronchial fibroelastotic scar was felt to be a nonspecific injury related to reflux or prior XRT and not related to her chronic cough. ?Oncology at [facility] -  ?     Patient followed for hx DCIS and local recurrence s/p bilaterally mastectomy with no evidence of disease.  recurrence. Currently has monthly port flushes. ?     The note also comments on PCP work-up comments on patient reporting hematuria with UA showing 1+ blood however this is not seen on recent PCP note.  ?IM at [place] -  ?     Per  PCP note on 03/17/19, patient previously seen by Cardiology for CHF with right heart failure and pH with EF 35% however EF normalized on echo 11/2018 ? ?Past Medical History:  ?Diagnosis Date  ? Addison's disease ([place])   ? Adrenal insufficiency ([place])   ? Anemia   ? Anxiety   ? Aortic stenosis   ? Aortic stenosis   ? Appendicitis 12/19/09  ? Appendicitis   ? Breast cancer ([place])   ? STATUS POST BILATERAL MASTECTOMY. STATUS POST RECONSTRUCTION. SHE HAD SILICONE BREAST  IMPLANTS AND THE LEFT IMPLANT IS LEAKING SLIGHTLY  ? Cellulitis of right middle finger 11/07/2018  ? Cervical cancer ([place]) 12/23/2018  ? Chest pain   ? CHF with right heart failure ([place]) 04/17/2017  ? Chronic respiratory failure with hypoxia ([place]) 12/23/2018  ? Cough variant asthma 04/13/2019  ? Depression   ? GERD (gastroesophageal reflux  disease)   ? takes Dexilant and carafate and gi coctail   ? Headache   ? migraines on a daily and monthly regimen   ? Heart murmur   ? History of kidney stones   ? Hodgkin lymphoma ([place])   ? STATUS POST MANTLE RADIATION  ? Hodgkin's lymphoma ([place])   ? 1987  ? Hypertension   ? Hypoxia   ? Necrotizing fasciitis ([place]) 12/23/2018  ? Non-ischemic cardiomyopathy ([place])   ? Osteoporosis   ? Palpitations   ? Pituitary adenoma ([place]) 12/23/2018  ? Pneumonia   ? PONV (postoperative nausea and vomiting)   ? Pre-diabetes   ? per pt; no meds  ? Pulmonary hypertension ([place]) 12/23/2018  ? Raynaud phenomenon   ? Right heart failure ([place]) 04/17/2017  ? Seizures ([place])   ? last febrile seizure was approx 3 weeks ago per report on 12/01/2020  ? Sleep apnea   ? upcoming sleep study per pt   ? Supplemental oxygen dependent   ? 3 liters  ? SVT (supraventricular tachycardia) ([place])   ? Tachycardia   ? Thyroid cancer ([place])   ? STATUS POST SURGICAL REMOVAL-CURRENT ON THYROID REPLACEMENT  ?  ? ?Allergies  ?Allergen Reactions  ? Ferrous Bisglycinate Chelate [Iron] Anaphylaxis  ?  Only IV   ? Mushroom Extract Complex Anaphylaxis  ? Na Ferric Gluc Cplx In Sucrose Anaphylaxis  ? Cymbalta [Duloxetine Hcl] Swelling and Anxiety  ? Phenergan [Promethazine]   ?  Other reaction(s): Unknown  ? Succinylcholine Other (See Comments)  ?  Lock Jaw  ? Buprenorphine Hcl Hives  ? Compazine Other (See Comments)  ?  Altered mental status ?Aggression  ? Metoclopramide Other (See Comments)  ?  Dystonia  ? Morphine And Related Hives  ? Ondansetron Hives and Rash  ?  Other reaction(s): Unknown  ? Promethazine Hcl Hives  ? Tegaderm Ag Mesh [Silver] Rash  ?  Old formulation only, is able to tolerate new formulation  ?  ? ?Outpatient Medications Prior to Visit  ?Medication Sig Dispense Refill  ? albuterol (2.5 MG/3ML) 0.083% NEBU 3 mL, albuterol (5 MG/ML) 0.5% NEBU 0.5 mL Inhale 3 mg into the lungs every 6 (six) hours as needed (SOB).     ? albuterol (PROVENTIL) (2.5 MG/3ML)  0.083% nebulizer solution Take 3 mLs (2.5 mg total) by nebulization every 6 (six) hours as needed for wheezing or shortness of breath. 75 mL 12  ? albuterol (VENTOLIN HFA) 108 (90 Base) MCG/ACT inhaler Inhale 2 puffs into the lungs every 6 (six) hours as needed for wheezing or shortness of breath.    ? ALPRAZolam (XANAX) 0.5 MG tablet Take 1 tablet (0.5 mg total) by mouth 2 (two) times daily as needed for anxiety. 60 tablet 2  ? alum & mag hydroxide-simeth (MAALOX/MYLANTA) 200-200-20 MG/5ML suspension Take 30 mLs by mouth daily as needed for indigestion or heartburn. For Bleeding Ulcers    ? aspirin 325 MG tablet Take 325 mg by mouth at bedtime.     ? beclomethasone (QVAR) 80 MCG/ACT inhaler Inhale 1 puff into the lungs 2 (two) times daily.     ? beclomethasone (QVAR)  80 MCG/ACT inhaler Inhale 1 puff into the lungs 2 (two) times daily. 1 each 6  ? benzonatate (TESSALON) 200 MG capsule Take 1 capsule (200 mg total) by mouth 3 (three) times daily as needed for cough. 45 capsule 1  ? BREO ELLIPTA 200-25 MCG/ACT AEPB TAKE 1 PUFF BY MOUTH EVERY DAY 60 each 3  ? bumetanide (BUMEX) 1 MG tablet TAKE 1 TABLET BY MOUTH TWICE A DAY 180 tablet 1  ? buPROPion (WELLBUTRIN XL) 150 MG 24 hr tablet Take 1 tablet (150 mg total) by mouth daily. 90 tablet 1  ? cyclobenzaprine (FLEXERIL) 5 MG tablet Take 1 tablet (5 mg total) by mouth 3 (three) times daily as needed for muscle spasms. 30 tablet 0  ? dexlansoprazole (DEXILANT) 60 MG capsule Take 60 mg by mouth daily.    ? Diclofenac Potassium,Migraine, (CAMBIA) 50 MG PACK Take 1 packet by mouth as needed for migraine.    ? diphenhydrAMINE (BENADRYL) 50 MG/ML injection Inject 1 mL (50 mg total) into the vein every 6 (six) hours as needed (nausea). 25 mL 0  ? dronabinol (MARINOL) 2.5 MG capsule TAKE 1 CAPSULE BY MOUTH TWICE DAILY AS NEEDED 30 capsule 3  ? EPINEPHrine 0.3 mg/0.3 mL IJ SOAJ injection Inject 0.3 mg into the muscle as needed for anaphylaxis. 2 each 2  ? escitalopram (LEXAPRO)  20 MG tablet Take 1 tablet (20 mg total) by mouth every evening. 90 tablet 3  ? fluconazole (DIFLUCAN) 150 MG tablet Take 1 tablet (150 mg total) by mouth daily. If needed for yeast infection 5 tablet 0  ? Casey Burkitt

## 2022-02-02 NOTE — Patient Instructions (Signed)
Moderate persistent eosinophilic asthma ?Discontinue Breo  ?Discontinue Qvar ?START Advair 230-21 mcg TWO puff TWICE a day ?CONTINUE Albuterol AS NEEDED for shortness of breath or wheezing ? ?Chronic hypoxemic respiratory failure ?CONTINUE supplemental oxygen for goal greater than >88% ?Try to give yourself a break on lower oxygen levels as long as it is above 88%. ? ?Follow-up with me in 3 months ?

## 2022-02-06 ENCOUNTER — Encounter: Payer: Self-pay | Admitting: Hematology and Oncology

## 2022-02-08 ENCOUNTER — Other Ambulatory Visit: Payer: Self-pay

## 2022-02-08 ENCOUNTER — Inpatient Hospital Stay: Payer: BC Managed Care – PPO

## 2022-02-08 ENCOUNTER — Other Ambulatory Visit: Payer: Self-pay | Admitting: Adult Health

## 2022-02-08 DIAGNOSIS — C8198 Hodgkin lymphoma, unspecified, lymph nodes of multiple sites: Secondary | ICD-10-CM

## 2022-02-08 DIAGNOSIS — E785 Hyperlipidemia, unspecified: Secondary | ICD-10-CM

## 2022-02-08 DIAGNOSIS — F331 Major depressive disorder, recurrent, moderate: Secondary | ICD-10-CM

## 2022-02-08 DIAGNOSIS — R5383 Other fatigue: Secondary | ICD-10-CM

## 2022-02-09 ENCOUNTER — Other Ambulatory Visit: Payer: Self-pay

## 2022-02-09 ENCOUNTER — Encounter: Payer: Self-pay | Admitting: Pulmonary Disease

## 2022-02-09 ENCOUNTER — Inpatient Hospital Stay: Payer: BC Managed Care – PPO | Attending: Hematology and Oncology

## 2022-02-09 DIAGNOSIS — Z86000 Personal history of in-situ neoplasm of breast: Secondary | ICD-10-CM | POA: Insufficient documentation

## 2022-02-09 DIAGNOSIS — Z85850 Personal history of malignant neoplasm of thyroid: Secondary | ICD-10-CM | POA: Insufficient documentation

## 2022-02-09 DIAGNOSIS — Z79899 Other long term (current) drug therapy: Secondary | ICD-10-CM | POA: Diagnosis not present

## 2022-02-09 DIAGNOSIS — R5383 Other fatigue: Secondary | ICD-10-CM | POA: Insufficient documentation

## 2022-02-09 DIAGNOSIS — C8198 Hodgkin lymphoma, unspecified, lymph nodes of multiple sites: Secondary | ICD-10-CM

## 2022-02-09 DIAGNOSIS — Z452 Encounter for adjustment and management of vascular access device: Secondary | ICD-10-CM | POA: Insufficient documentation

## 2022-02-09 DIAGNOSIS — I890 Lymphedema, not elsewhere classified: Secondary | ICD-10-CM | POA: Diagnosis not present

## 2022-02-09 DIAGNOSIS — J4550 Severe persistent asthma, uncomplicated: Secondary | ICD-10-CM

## 2022-02-09 DIAGNOSIS — E785 Hyperlipidemia, unspecified: Secondary | ICD-10-CM | POA: Insufficient documentation

## 2022-02-09 DIAGNOSIS — Z95828 Presence of other vascular implants and grafts: Secondary | ICD-10-CM

## 2022-02-09 DIAGNOSIS — Z8571 Personal history of Hodgkin lymphoma: Secondary | ICD-10-CM | POA: Insufficient documentation

## 2022-02-09 DIAGNOSIS — Z8541 Personal history of malignant neoplasm of cervix uteri: Secondary | ICD-10-CM | POA: Diagnosis not present

## 2022-02-09 LAB — CMP (CANCER CENTER ONLY)
ALT: 40 U/L (ref 0–44)
AST: 26 U/L (ref 15–41)
Albumin: 4 g/dL (ref 3.5–5.0)
Alkaline Phosphatase: 91 U/L (ref 38–126)
Anion gap: 8 (ref 5–15)
BUN: 21 mg/dL — ABNORMAL HIGH (ref 6–20)
CO2: 26 mmol/L (ref 22–32)
Calcium: 9 mg/dL (ref 8.9–10.3)
Chloride: 105 mmol/L (ref 98–111)
Creatinine: 1.08 mg/dL — ABNORMAL HIGH (ref 0.44–1.00)
GFR, Estimated: >60 mL/min (ref >60)
Glucose, Bld: 104 mg/dL — ABNORMAL HIGH (ref 70–99)
Potassium: 3.7 mmol/L (ref 3.5–5.1)
Sodium: 139 mmol/L (ref 135–145)
Total Bilirubin: 0.4 mg/dL (ref 0.3–1.2)
Total Protein: 7.9 g/dL (ref 6.5–8.1)

## 2022-02-09 LAB — CBC WITH DIFFERENTIAL (CANCER CENTER ONLY)
Abs Immature Granulocytes: 0.03 10*3/uL (ref 0.00–0.07)
Basophils Absolute: 0.1 10*3/uL (ref 0.0–0.1)
Basophils Relative: 1 %
Eosinophils Absolute: 0.1 10*3/uL (ref 0.0–0.5)
Eosinophils Relative: 1 %
HCT: 33.4 % — ABNORMAL LOW (ref 36.0–46.0)
Hemoglobin: 10.4 g/dL — ABNORMAL LOW (ref 12.0–15.0)
Immature Granulocytes: 0 %
Lymphocytes Relative: 26 %
Lymphs Abs: 2.1 10*3/uL (ref 0.7–4.0)
MCH: 24.5 pg — ABNORMAL LOW (ref 26.0–34.0)
MCHC: 31.1 g/dL (ref 30.0–36.0)
MCV: 78.6 fL — ABNORMAL LOW (ref 80.0–100.0)
Monocytes Absolute: 1 10*3/uL (ref 0.1–1.0)
Monocytes Relative: 12 %
Neutro Abs: 4.8 10*3/uL (ref 1.7–7.7)
Neutrophils Relative %: 60 %
Platelet Count: 357 10*3/uL (ref 150–400)
RBC: 4.25 MIL/uL (ref 3.87–5.11)
RDW: 16.8 % — ABNORMAL HIGH (ref 11.5–15.5)
WBC Count: 8.1 10*3/uL (ref 4.0–10.5)
nRBC: 0 % (ref 0.0–0.2)

## 2022-02-09 LAB — HEMOGLOBIN A1C
Hgb A1c MFr Bld: 6.3 % — ABNORMAL HIGH (ref 4.8–5.6)
Mean Plasma Glucose: 134.11 mg/dL

## 2022-02-09 LAB — TSH: TSH: 0.37 u[IU]/mL (ref 0.350–4.500)

## 2022-02-09 LAB — T4, FREE: Free T4: 0.95 ng/dL (ref 0.61–1.12)

## 2022-02-09 MED ORDER — SODIUM CHLORIDE 0.9% FLUSH
10.0000 mL | INTRAVENOUS | Status: DC | PRN
  Administered 2022-02-09: 10 mL

## 2022-02-09 MED ORDER — HEPARIN SOD (PORK) LOCK FLUSH 100 UNIT/ML IV SOLN
500.0000 [IU] | Freq: Once | INTRAVENOUS | Status: AC | PRN
  Administered 2022-02-09: 500 [IU]

## 2022-02-10 LAB — T3, FREE: T3, Free: 2.5 pg/mL (ref 2.0–4.4)

## 2022-02-15 ENCOUNTER — Encounter: Payer: Self-pay | Admitting: Adult Health

## 2022-02-15 ENCOUNTER — Telehealth (INDEPENDENT_AMBULATORY_CARE_PROVIDER_SITE_OTHER): Payer: BC Managed Care – PPO | Admitting: Adult Health

## 2022-02-15 DIAGNOSIS — F331 Major depressive disorder, recurrent, moderate: Secondary | ICD-10-CM | POA: Diagnosis not present

## 2022-02-15 MED ORDER — LAMOTRIGINE 25 MG PO TABS: ORAL_TABLET | ORAL | 2 refills | Status: DC

## 2022-02-15 MED ORDER — ALPRAZOLAM 0.5 MG PO TABS: 0.5000 mg | ORAL_TABLET | Freq: Two times a day (BID) | ORAL | 2 refills | Status: DC | PRN

## 2022-02-15 NOTE — Progress Notes (Signed)
[name] [id number] 06/10/1971 51 y.o.  Virtual Visit via Video Note  I connected with pt @ on 02/15/22 at  8:00 AM EDT by a video enabled telemedicine application and verified that I am speaking with the correct person using two identifiers.   I discussed the limitations of evaluation and management by telemedicine and the availability of in person appointments. The patient expressed understanding and agreed to proceed.  I discussed the assessment and treatment plan with the patient. The patient was provided an opportunity to ask questions and all were answered. The patient agreed with the plan and demonstrated an understanding of the instructions.   The patient was advised to call back or seek an in-person evaluation if the symptoms worsen or if the condition fails to improve as anticipated.  I provided 25 minutes of non-face-to-face time during this encounter.  The patient was located at home.  The provider was located at [place].   Aloha Gell, NP   Subjective:   Patient ID:  [name] is a 51 y.o. (DOB [birth year]) female.  Chief Complaint: No chief complaint on file.   HPI [name] presents for follow-up of MDD, GAD, insomnia, and panic attacks.  Describes mood today as "not the best". Pleasant. Tearful at times. Mood symptoms - reports depression. Feels anxious at times. Denies irritability. Reports one recent panic attack. Felt like the increase in Lamictal was helpful for about a week and then it wasn't. Attributes decline in mood to increase stressors surrounding her son - special needs - "he gets bullied a lot". Would like to increase the Lamictal from 60m to 719mdaily. Stating "I'm not where I want to bed". Planning to see DeRinaldo Cloudor therapy.Fighting cancer since age 51 Hodgkins stage 4.98Also treated for multiple medical issues. Father also with prostate cancer. Varying interest and motivation. Taking medications as prescribed.   Energy levels stable. Active, does not have a regular exercise routine with current physical disabilities. Has been working outside in the yard. Enjoys some usual interests and activities. Married. Lives with husband. Has a 2512ear old daughter - "I haven't heard from her" and a 1547ear old son. Spending time with family.  Appetite decreased. Weight stable 181 pounds. Sleeps better some nights than others. Averages 7 hours with Ambien. Reports some daytime napping. Planning to have a sleep study. Focus and concentration "it's off a lot". Completing tasks. Managing aspects of household. Disabled in 2016 - [employer] or HI.  Denies AH or VH.  Previous medication trials: Wellbutrin - mean, Zoloft-not helpful     Review of Systems:  Review of Systems  Musculoskeletal:  Negative for gait problem.  Neurological:  Negative for tremors.  Psychiatric/Behavioral:         Please refer to HPI   Medications: I have reviewed the patient's current medications.  Current Outpatient Medications  Medication Sig Dispense Refill   albuterol (2.5 MG/3ML) 0.083% NEBU 3 mL, albuterol (5 MG/ML) 0.5% NEBU 0.5 mL Inhale 3 mg into the lungs every 6 (six) hours as needed (SOB).      albuterol (PROVENTIL) (2.5 MG/3ML) 0.083% nebulizer solution Take 3 mLs (2.5 mg total) by nebulization every 6 (six) hours as needed for wheezing or shortness of breath. 75 mL 12   albuterol (VENTOLIN HFA) 108 (90 Base) MCG/ACT inhaler Inhale 2 puffs into the lungs every 6 (six) hours as needed for wheezing or shortness of breath.  ALPRAZolam (XANAX) 0.5 MG tablet Take 1 tablet (0.5 mg total) by mouth 2 (two) times daily as needed for anxiety. 60 tablet 2   alum & mag hydroxide-simeth (MAALOX/MYLANTA) 200-200-20 MG/5ML suspension Take 30 mLs by mouth daily as needed for indigestion or heartburn. For Bleeding Ulcers     aspirin 325 MG tablet Take 325 mg by mouth at bedtime.      benzonatate (TESSALON) 200 MG capsule  Take 1 capsule (200 mg total) by mouth 3 (three) times daily as needed for cough. 45 capsule 1   bumetanide (BUMEX) 1 MG tablet TAKE 1 TABLET BY MOUTH TWICE A DAY 180 tablet 1   buPROPion (WELLBUTRIN XL) 150 MG 24 hr tablet Take 1 tablet (150 mg total) by mouth daily. 90 tablet 1   cyclobenzaprine (FLEXERIL) 5 MG tablet Take 1 tablet (5 mg total) by mouth 3 (three) times daily as needed for muscle spasms. 30 tablet 0   dexlansoprazole (DEXILANT) 60 MG capsule Take 60 mg by mouth daily.     Diclofenac Potassium,Migraine, (CAMBIA) 50 MG PACK Take 1 packet by mouth as needed for migraine.     diphenhydrAMINE (BENADRYL) 50 MG/ML injection Inject 1 mL (50 mg total) into the vein every 6 (six) hours as needed (nausea). 25 mL 0   dronabinol (MARINOL) 2.5 MG capsule TAKE 1 CAPSULE BY MOUTH TWICE DAILY AS NEEDED 30 capsule 3   EPINEPHrine 0.3 mg/0.3 mL IJ SOAJ injection Inject 0.3 mg into the muscle as needed for anaphylaxis. 2 each 2   escitalopram (LEXAPRO) 20 MG tablet Take 1 tablet (20 mg total) by mouth every evening. 90 tablet 3   fluconazole (DIFLUCAN) 150 MG tablet Take 1 tablet (150 mg total) by mouth daily. If needed for yeast infection 5 tablet 0   fluticasone-salmeterol (ADVAIR HFA) 230-21 MCG/ACT inhaler Inhale 2 puffs into the lungs 2 (two) times daily. 1 each 5   Galcanezumab-gnlm 120 MG/ML SOAJ Inject 120 mg into the skin every 28 (twenty-eight) days.      heparin lock flush 100 UNIT/ML SOLN injection USE 5 MLS AS A HEPLOCK IN PORT A CATH ONCE DAILY AFTER MEDICATION ADMINISTRATION OR AS DIRECTED BY PHYSICIAN. 150 mL 1   hydrocortisone (CORTEF) 10 MG tablet Take 1-2 tablets (10-20 mg total) by mouth See admin instructions. Take 20 mg in the am and 40m in the evening (Patient taking differently: Take 10-20 mg by mouth See admin instructions. Take 20 mg by mouth in the morning and 17min the evening)     lamoTRIgine (LAMICTAL) 25 MG tablet Take three tablets at bedtime. 90 tablet 2   Lancets  (ONETOUCH DELICA PLUS [id number] 3 (three) times daily. for testing     levothyroxine (SYNTHROID, LEVOTHROID) 112 MCG tablet Take 112 mcg by mouth daily before breakfast.     metoprolol succinate (TOPROL-XL) 25 MG 24 hr tablet Take 37.5 mg by mouth daily.      ONETOUCH VERIO test strip 3 (three) times daily. for testing     OXYGEN Inhale 3-4 L into the lungs continuous.     potassium chloride (KLOR-CON) 10 MEQ tablet Take 1 tablet (10 mEq total) by mouth daily for 21 days. 21 tablet 0   potassium chloride (KLOR-CON) 10 MEQ tablet Take 10 mEq by mouth daily as needed (low potassium).     predniSONE (DELTASONE) 10 MG tablet Take 4 tabs x 2 days, then 3 x 2d, then 2 x 2d, then 1 x 2d and STOP 20 tablet  0   Rimegepant Sulfate (NURTEC) 75 MG TBDP Take 75 mg by mouth daily as needed (Migraine).      rosuvastatin (CRESTOR) 10 MG tablet Take 10 mg by mouth daily.      silver sulfADIAZINE (SILVADENE) 1 % cream Apply 1 application topically daily as needed (Open Wound).      sitaGLIPtin (JANUVIA) 100 MG tablet Take 1 tablet (100 mg total) by mouth daily.     Sodium Chloride Flush (NORMAL SALINE FLUSH) 0.9 % SOLN FLUSH WITH TWO 10 ML FLUSHES IN PORT BEFORE AND AFTER FLUIDS AND MEDICATION ADMINISTRATION, TO MAINTAIN PATENCY--USE UP TO 4 TIMES DAILY. 2400 mL 1   sucralfate (CARAFATE) 1 g tablet Take 1 g by mouth daily as needed (FOR ULCERS).      sulfamethoxazole-trimethoprim (BACTRIM DS) 800-160 MG tablet Take 1 tablet by mouth 2 (two) times daily. 10 tablet 0   SYNTHROID 100 MCG tablet Take 1 tablet (100 mcg total) by mouth every morning.     topiramate (TOPAMAX) 50 MG tablet Take 150 mg by mouth at bedtime.      Current Facility-Administered Medications  Medication Dose Route Frequency Provider Last Rate Last Admin   0.9 %  sodium chloride infusion   Intravenous PRN Margaretha Seeds, MD       Mepolizumab SOLR 100 mg  100 mg Subcutaneous Q28 days Margaretha Seeds, MD   100 mg at 08/31/19 1017     Medication Side Effects: None  Allergies:  Allergies  Allergen Reactions   Ferrous Bisglycinate Chelate [Iron] Anaphylaxis    Only IV    Mushroom Extract Complex Anaphylaxis   Na Ferric Gluc Cplx In Sucrose Anaphylaxis   Cymbalta [Duloxetine Hcl] Swelling and Anxiety   Phenergan [Promethazine]     Other reaction(s): Unknown   Promethazine Hcl     Other reaction(s): Unknown   Succinylcholine Other (See Comments)    Lock Jaw   Buprenorphine Hcl Hives   Compazine Other (See Comments)    Altered mental status Aggression   Metoclopramide Other (See Comments)    Dystonia   Morphine And Related Hives   Ondansetron Hives and Rash    Other reaction(s): Unknown Other reaction(s): Unknown   Promethazine Hcl Hives   Tegaderm Ag Mesh [Silver] Rash    Old formulation only, is able to tolerate new formulation    Past Medical History:  Diagnosis Date   Addison's disease ([place])    Adrenal insufficiency (HCC)    Anemia    Anxiety    Aortic stenosis    Aortic stenosis    Appendicitis 12/19/09   Appendicitis    Breast cancer (HCC)    STATUS POST BILATERAL MASTECTOMY. STATUS POST RECONSTRUCTION. SHE HAD SILICONE BREAST IMPLANTS AND THE LEFT IMPLANT IS LEAKING SLIGHTLY   Cellulitis of right middle finger 11/07/2018   Cervical cancer ([place]) 12/23/2018   Chest pain    CHF with right heart failure ([place]) 04/17/2017   Chronic respiratory failure with hypoxia (HCC) 12/23/2018   Cough variant asthma 04/13/2019   Depression    GERD (gastroesophageal reflux disease)    takes Dexilant and carafate and gi coctail    Headache    migraines on a daily and monthly regimen    Heart murmur    History of kidney stones    Hodgkin lymphoma ([place])    STATUS POST MANTLE RADIATION   Hodgkin's lymphoma ([place])    1987   Hypertension    Hypoxia    Necrotizing fasciitis ([place])  12/23/2018   Non-ischemic cardiomyopathy (HCC)    Osteoporosis    Palpitations    Pituitary adenoma ([place]) 12/23/2018   Pneumonia     PONV (postoperative nausea and vomiting)    Pre-diabetes    per pt; no meds   Pulmonary hypertension ([place]) 12/23/2018   Raynaud phenomenon    Right heart failure ([place]) 04/17/2017   Seizures ([place])    last febrile seizure was approx 3 weeks ago per report on 12/01/2020   Sleep apnea    upcoming sleep study per pt    Supplemental oxygen dependent    3 liters   SVT (supraventricular tachycardia) (HCC)    Tachycardia    Thyroid cancer ([place])    STATUS POST SURGICAL REMOVAL-CURRENT ON THYROID REPLACEMENT    Family History  Family history unknown: Yes    Social History   Socioeconomic History   Marital status: Married    Spouse name: Not on file   Number of children: Not on file   Years of education: Not on file   Highest education level: Not on file  Occupational History   Not on file  Tobacco Use   Smoking status: Never   Smokeless tobacco: Never  Vaping Use   Vaping Use: Never used  Substance and Sexual Activity   Alcohol use: Not Currently    Comment: social    Drug use: No   Sexual activity: Yes    Birth control/protection: Surgical  Other Topics Concern   Not on file  Social History Narrative   Not on file   Social Determinants of Health   Financial Resource Strain: Not on file  Food Insecurity: Not on file  Transportation Needs: Not on file  Physical Activity: Not on file  Stress: Not on file  Social Connections: Not on file  Intimate Partner Violence: Not on file    Past Medical History, Surgical history, Social history, and Family history were reviewed and updated as appropriate.   Please see review of systems for further details on the patient's review from today.   Objective:   Physical Exam:  LMP  (LMP Unknown)   Physical Exam Constitutional:      General: She is not in acute distress. Musculoskeletal:        General: No deformity.  Neurological:     Mental Status: She is alert and oriented to person, place, and time.     Coordination:  Coordination normal.  Psychiatric:        Attention and Perception: Attention and perception normal. She does not perceive auditory or visual hallucinations.        Mood and Affect: Mood normal. Mood is not anxious or depressed. Affect is not labile, blunt, angry or inappropriate.        Speech: Speech normal.        Behavior: Behavior normal.        Thought Content: Thought content normal. Thought content is not paranoid or delusional. Thought content does not include homicidal or suicidal ideation. Thought content does not include homicidal or suicidal plan.        Cognition and Memory: Cognition and memory normal.        Judgment: Judgment normal.     Comments: Insight intact    Lab Review:     Component Value Date/Time   NA 139 02/09/2022 1202   NA 141 04/01/2018 1617   K 3.7 02/09/2022 1202   CL 105 02/09/2022 1202   CO2 26 02/09/2022 1202  GLUCOSE 104 (H) 02/09/2022 1202   BUN 21 (H) 02/09/2022 1202   BUN 11 04/01/2018 1617   CREATININE 1.08 (H) 02/09/2022 1202   CALCIUM 9.0 02/09/2022 1202   PROT 7.9 02/09/2022 1202   PROT 7.1 01/23/2018 1225   ALBUMIN 4.0 02/09/2022 1202   ALBUMIN 4.4 01/23/2018 1225   AST 26 02/09/2022 1202   ALT 40 02/09/2022 1202   ALKPHOS 91 02/09/2022 1202   BILITOT 0.4 02/09/2022 1202   GFRNONAA >60 02/09/2022 1202   GFRAA >60 04/27/2020 0613   GFRAA >60 02/10/2020 1020       Component Value Date/Time   WBC 8.1 02/09/2022 1202   WBC 8.5 12/09/2020 0530   RBC 4.25 02/09/2022 1202   HGB 10.4 (L) 02/09/2022 1202   HGB 12.0 12/21/2019 1418   HCT 33.4 (L) 02/09/2022 1202   HCT 39.9 04/01/2018 1617   PLT 357 02/09/2022 1202   PLT 327 04/01/2018 1617   MCV 78.6 (L) 02/09/2022 1202   MCV 89 04/01/2018 1617   MCH 24.5 (L) 02/09/2022 1202   MCHC 31.1 02/09/2022 1202   RDW 16.8 (H) 02/09/2022 1202   RDW 13.6 04/01/2018 1617   LYMPHSABS 2.1 02/09/2022 1202   MONOABS 1.0 02/09/2022 1202   EOSABS 0.1 02/09/2022 1202   BASOSABS 0.1 02/09/2022  1202    No results found for: POCLITH, LITHIUM   No results found for: PHENYTOIN, PHENOBARB, VALPROATE, CBMZ   .res Assessment: Plan:    Plan:  PDMP reviewed  Lexapro 43m daily Xanax 0.545mBID Wellbutrin XL 15092mvery morning  Increase Lamictal 10m3m 75mg64mly  Discussed potential benefits, risk, and side effects of benzodiazepines to include potential risk of tolerance and dependence, as well as possible drowsiness.  Advised patient not to drive if experiencing drowsiness and to take lowest possible effective dose to minimize risk of dependence and tolerance.   Counseled patient regarding potential benefits, risks, and side effects of Lamictal to include potential risk of Stevens-Johnson syndrome. Advised patient to stop taking Lamictal and contact office immediately if rash develops and to seek urgent medical attention if rash is severe and/or spreading quickly. Set up with therapist  RTC 4 weeks  Patient advised to contact office with any questions, adverse effects, or acute worsening in signs and symptoms.  Diagnoses and all orders for this visit:  Major depressive disorder, recurrent episode, moderate (HCC) -     ALPRAZolam (XANAX) 0.5 MG tablet; Take 1 tablet (0.5 mg total) by mouth 2 (two) times daily as needed for anxiety. -     lamoTRIgine (LAMICTAL) 25 MG tablet; Take three tablets at bedtime.     Please see After Visit Summary for patient specific instructions.  Future Appointments  Date Time Provider [place]/2023 10:00 AM Dowd,Shanon AceW CP-CP None  03/22/2022 11:45 AM [facility] None  04/19/2022 11:00 AM [facility] LAB [facility] None  04/19/2022 11:30 AM [facility]udenNicholas Lose[facility]MED[facility] None  05/03/2022 11:45 AM [facility] None  06/14/2022 11:45 AM [facility] None  07/26/2022 11:45 AM [facility] None  09/06/2022 11:45 AM [facility] None   10/18/2022 11:45 AM [facility] None  11/29/2022 11:45 AM [facility] None    No orders of the defined types were placed in this encounter.     -------------------------------

## 2022-02-27 DIAGNOSIS — E119 Type 2 diabetes mellitus without complications: Secondary | ICD-10-CM | POA: Diagnosis not present

## 2022-02-27 DIAGNOSIS — R079 Chest pain, unspecified: Secondary | ICD-10-CM | POA: Diagnosis not present

## 2022-02-27 DIAGNOSIS — F32A Depression, unspecified: Secondary | ICD-10-CM | POA: Diagnosis not present

## 2022-02-27 DIAGNOSIS — I10 Essential (primary) hypertension: Secondary | ICD-10-CM | POA: Diagnosis not present

## 2022-02-27 DIAGNOSIS — Z853 Personal history of malignant neoplasm of breast: Secondary | ICD-10-CM | POA: Diagnosis not present

## 2022-02-27 DIAGNOSIS — Z885 Allergy status to narcotic agent status: Secondary | ICD-10-CM | POA: Diagnosis not present

## 2022-02-27 DIAGNOSIS — F909 Attention-deficit hyperactivity disorder, unspecified type: Secondary | ICD-10-CM | POA: Diagnosis not present

## 2022-02-27 DIAGNOSIS — R531 Weakness: Secondary | ICD-10-CM | POA: Diagnosis not present

## 2022-02-27 DIAGNOSIS — Z8541 Personal history of malignant neoplasm of cervix uteri: Secondary | ICD-10-CM | POA: Diagnosis not present

## 2022-02-27 DIAGNOSIS — Z9221 Personal history of antineoplastic chemotherapy: Secondary | ICD-10-CM | POA: Diagnosis not present

## 2022-02-27 DIAGNOSIS — K648 Other hemorrhoids: Secondary | ICD-10-CM | POA: Diagnosis not present

## 2022-02-27 DIAGNOSIS — Z8571 Personal history of Hodgkin lymphoma: Secondary | ICD-10-CM | POA: Diagnosis not present

## 2022-02-27 DIAGNOSIS — Z923 Personal history of irradiation: Secondary | ICD-10-CM | POA: Diagnosis not present

## 2022-02-27 DIAGNOSIS — J449 Chronic obstructive pulmonary disease, unspecified: Secondary | ICD-10-CM | POA: Diagnosis not present

## 2022-02-27 DIAGNOSIS — G8194 Hemiplegia, unspecified affecting left nondominant side: Secondary | ICD-10-CM | POA: Diagnosis not present

## 2022-02-27 DIAGNOSIS — G43909 Migraine, unspecified, not intractable, without status migrainosus: Secondary | ICD-10-CM | POA: Diagnosis not present

## 2022-02-27 DIAGNOSIS — F419 Anxiety disorder, unspecified: Secondary | ICD-10-CM | POA: Diagnosis not present

## 2022-02-27 DIAGNOSIS — R0789 Other chest pain: Secondary | ICD-10-CM | POA: Diagnosis not present

## 2022-02-27 DIAGNOSIS — R29818 Other symptoms and signs involving the nervous system: Secondary | ICD-10-CM | POA: Diagnosis not present

## 2022-02-27 DIAGNOSIS — F430 Acute stress reaction: Secondary | ICD-10-CM | POA: Diagnosis not present

## 2022-02-27 DIAGNOSIS — R201 Hypoesthesia of skin: Secondary | ICD-10-CM | POA: Diagnosis not present

## 2022-02-27 DIAGNOSIS — E890 Postprocedural hypothyroidism: Secondary | ICD-10-CM | POA: Diagnosis not present

## 2022-03-02 ENCOUNTER — Other Ambulatory Visit: Payer: Self-pay

## 2022-03-02 ENCOUNTER — Encounter: Payer: Self-pay | Admitting: Internal Medicine

## 2022-03-02 ENCOUNTER — Other Ambulatory Visit: Payer: Self-pay | Admitting: Hematology and Oncology

## 2022-03-02 ENCOUNTER — Telehealth: Payer: Self-pay | Admitting: *Deleted

## 2022-03-02 ENCOUNTER — Ambulatory Visit (INDEPENDENT_AMBULATORY_CARE_PROVIDER_SITE_OTHER): Payer: BC Managed Care – PPO | Admitting: Internal Medicine

## 2022-03-02 ENCOUNTER — Telehealth: Payer: Self-pay

## 2022-03-02 VITALS — BP 134/84 | HR 116 | Temp 99.7°F | Ht 65.0 in | Wt 181.0 lb

## 2022-03-02 DIAGNOSIS — Z95828 Presence of other vascular implants and grafts: Secondary | ICD-10-CM

## 2022-03-02 MED ORDER — SULFAMETHOXAZOLE-TRIMETHOPRIM 800-160 MG PO TABS
1.0000 | ORAL_TABLET | Freq: Two times a day (BID) | ORAL | 0 refills | Status: DC
Start: 2022-03-02 — End: 2022-03-29

## 2022-03-02 NOTE — Telephone Encounter (Signed)
Called patient regarding the following message;  Hi [name]! I work with Dr. Lindi Adie over at the cancer center. [name] called and left a voice mail saying she was seen at the [facility] ED this week and they had trouble with accessing her port. she said the port line over her clavicle is now swollen, red and sore. she mentioned there was an open area over the port as well. Dr. Baxter Flattery had been the one that placed her port twice and a picc line due to poor venous access. [name] has no evidence of cancer and does not receive any treatments. We do her port flushes here out of convenience for her. She is wanting to come in today for [place] to look at her port and flush it. If it is red, swollen, and an open area, we are not allowed to touch it. Dr. Lindi Adie is also out of the office for the next 2 weeks and in the past has deferred anything related to the port to Dr. Baxter Flattery to address considering she is the ordering provider. Are you guys able to get her in today or sometime next week to look and see if it is infected and needs to be removed?    Patient scheduled to see Dr. Linus Salmons today at 1. [name], RMA

## 2022-03-02 NOTE — Progress Notes (Signed)
   Subjective:    Patient ID: [name], female    DOB: November 13, 1970, 51 y.o.   MRN: [id number]  HPI Here for a work in visit for redness at her port site.  She was in the ED for chest pain at [facility] and after they accessed the port she noticed some redness and has been tender.  They recommended admission but she opted to leave and called her oncologist with concerns for a port infection and came here now.  She is tearful and concerned about an infection and clot.  No fever, reports a 'high' temperature of 100.0.     Review of Systems  Constitutional:  Negative for chills and fever.  Gastrointestinal:  Negative for diarrhea and nausea.  Skin:  Negative for rash.      Objective:   Physical Exam Chest:     Comments: Port site on right chest with no erythema or warmth. Some irritation of the skin superior to the port along the neck.    SH: no tobacco       Assessment & Plan:

## 2022-03-02 NOTE — Telephone Encounter (Signed)
Received call from pt stating she was recently hospitalized at [facility] and the nursing staff there had difficulty accessing her port. Pt states port is now red, swollen, and has an open area.  Pt requesting port flush appt and MD visit for further evaluation. RN reached out to Dr. Storm Frisk office with ID who had originally placed the port for further evaluation and tx. Office states they will contact pt for appt.

## 2022-03-02 NOTE — Assessment & Plan Note (Addendum)
Some mild erythema along the extended portion but no warmth, no swelling.  No issues at the port insertion site.  Tenderness with light touch.   reassurance provided.  Majority of time spent listening to patients concerns.  Will give Bactrim for 5 days   I have personally spent 30 minutes involved in face-to-face and non-face-to-face activities for this patient on the day of the visit. Professional time spent includes the following activities: Preparing to see the patient (review of tests), Obtaining and/or reviewing separately obtained history (admission/discharge record), Performing a medically appropriate examination and/or evaluation , Ordering medications/tests/procedures, referring and communicating with other health care professionals, Documenting clinical information in the EMR, Independently interpreting results (not separately reported), Communicating results to the patient/family/caregiver, Counseling and educating the patient/family/caregiver and Care coordination (not separately reported).

## 2022-03-06 DIAGNOSIS — F447 Conversion disorder with mixed symptom presentation: Secondary | ICD-10-CM | POA: Diagnosis not present

## 2022-03-07 ENCOUNTER — Ambulatory Visit: Payer: Medicare Other | Admitting: Psychiatry

## 2022-03-12 ENCOUNTER — Ambulatory Visit (INDEPENDENT_AMBULATORY_CARE_PROVIDER_SITE_OTHER): Payer: BC Managed Care – PPO | Admitting: Psychiatry

## 2022-03-12 ENCOUNTER — Ambulatory Visit: Payer: Medicare Other | Admitting: Psychiatry

## 2022-03-12 DIAGNOSIS — F331 Major depressive disorder, recurrent, moderate: Secondary | ICD-10-CM

## 2022-03-12 NOTE — Progress Notes (Signed)
[facility] Counsel[facility] Initial Adult Exam  Name: LITA FLYNN Date: 03/12/2022 MRN: [id number] DOB: September 24, 1971 PCP: Nicholas Lose, MD  Time spent: 60 minutes  Guardian/Payee:  patient  ("a former paramedic)  Paperwork requested:  No   Reason for Visit /Presenting Problem:  battling cancer since age 51, anxiety, depression, sadness, fears, feelings of inadequacy, panic attacks, feels a burden at times, "technically in remission" but "I have congestive heart failure, chronic pulmonary issues, denies any current SI, Cancer dx affected her being able to stay in school ad participated in some homebound schooling and attended [employer] 3 yrs til she stopped really trying and was overwhelmed with her cancer/scans/medication/ and other challenges.  Some resentment along with her anxiety and depression, and unresolved anger.  Mental Status Exam:    Appearance:   Casual     Behavior:  Appropriate and Motivated  Motor:  Normal  Speech/Language:   Clear and Coherent  Affect:  Depressed and anxious  Mood:  angry, anxious, depressed, irritable, and sad, angry at God  Thought process:  goal directed  Thought content:    overthinking  Sensory/Perceptual disturbances:    WNL  Orientation:  oriented to person, place, time/date, situation, day of week, month of year, year, and stated date of March 12, 2022  Attention:  Poor per patient report  Concentration:  Fair and Poor  Memory:  Some memory issues "short term"  Fund of knowledge:   Fair  Insight:    Good and Fair  Judgment:   Good and Fair  Impulse Control:  Good and Fair   Reported Symptoms:  see symptoms above  Risk Assessment: Danger to Self:  No Self-injurious Behavior: No Danger to Others: No Duty to Warn:no Physical Aggression / Violence:No  Access to Firearms a concern: No  Gang Involvement:No  Patient / guardian was educated about steps to take if suicide or homicide risk level increases between visits: denies thoughts of harming  self While future psychiatric events cannot be accurately predicted, the patient does not currently require acute inpatient psychiatric care and does not currently meet [facility] involuntary commitment criteria.  Substance Abuse History: Current substance abuse: No     Past Psychiatric History:   Previous psychological history is significant for anxiety and depression Outpatient [place], [place] History of Psych Hospitalization:  yes around age 2  Psychological Testing:  n/a    Abuse History: Victim of No.,  n/a    Report needed: No. Victim of Neglect:No. Perpetrator of  n/a   Witness / Exposure to Domestic Violence: No   Protective Services Involvement: No  Witness to [employer] Violence:  No   Family History: see below in "living situation" Family History  Family history unknown: Yes    Living situation: the patient (adopted at age 29) and grew up with both of her adoptive parents living in the same home and they currently reside in [place], [place].  Patient lives with their family which includes husband of 49 yrs, and son age 14.  Also has a daughter age 51 living in [place] with her husband. Husband  has a daughter by prior marriage. Both parents live together in [place].  Dad is my rock. Brother is best friend age 55 and is adopted also. Brother lives in [place], [place]. Parents had a daughter but patient not close to her at all.   Sexual Orientation:  Straight  Relationship Status: married  Name of spouse / other:n/a  If a parent, number of children / ages:see above info  Support Systems; hard to feel support from others but does feel support from brother and dad.  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  Stressors:Health problems    Strengths:  Family, some hopefulness  Barriers:  my health, "I'm just a shell" due to my health   Legal History: Pending legal issue / charges:  n/a. History of legal issue / charges:  n/a  Medical History/Surgical History:Reviewed with Patient and she confirms info below.  Past Medical History:  Diagnosis Date   Addison's disease ([place])    Adrenal insufficiency ([place])    Anemia    Anxiety    Aortic stenosis    Aortic stenosis    Appendicitis 12/19/09   Appendicitis    Breast cancer (HCC)    STATUS POST BILATERAL MASTECTOMY. STATUS POST RECONSTRUCTION. SHE HAD SILICONE BREAST IMPLANTS AND THE LEFT IMPLANT IS LEAKING SLIGHTLY   Cellulitis of right middle finger 11/07/2018   Cervical cancer ([place]) 12/23/2018   Chest pain    CHF with right heart failure ([place]) 04/17/2017   Chronic respiratory failure with hypoxia (HCC) 12/23/2018   Cough variant asthma 04/13/2019   Depression    GERD (gastroesophageal reflux disease)    takes Dexilant and carafate and gi coctail    Headache    migraines on a daily and monthly regimen    Heart murmur    History of kidney stones    Hodgkin lymphoma ([place])    STATUS POST MANTLE RADIATION   Hodgkin's lymphoma ([place])    1987   Hypertension    Hypoxia    Necrotizing fasciitis ([place]) 12/23/2018   Non-ischemic cardiomyopathy ([place])    Osteoporosis    Palpitations    Pituitary adenoma ([place]) 12/23/2018   Pneumonia    PONV (postoperative nausea and vomiting)    Pre-diabetes    per pt; no meds   Pulmonary hypertension ([place]) 12/23/2018   Raynaud phenomenon    Right heart failure ([place]) 04/17/2017   Seizures ([place])    last febrile seizure was approx 3 weeks ago per report on 12/01/2020   Sleep apnea    upcoming sleep study per pt    Supplemental oxygen dependent    3 liters   SVT  (supraventricular tachycardia) (HCC)    Tachycardia    Thyroid cancer ([place])    STATUS POST SURGICAL REMOVAL-CURRENT ON THYROID REPLACEMENT    Past Surgical History:  Procedure Laterality Date   ABDOMINAL HYSTERECTOMY     AMPUTATION Left 01/30/2019   Procedure: Left Index finger amputation with flap reconstruction and repair reconstruction;  Surgeon: Roseanne Kaufman, MD;  Location: [facility];  Service: Orthopedics;  Laterality: Left;   APPENDECTOMY     breast implants and removal      breast implants but leaking      CARDIAC CATHETERIZATION  05/18/09   NORMAL CATH   COLONOSCOPY     hx of chemotherapy      hx of radiation therapy      I & D EXTREMITY Left 12/23/2018   Procedure: IRRIGATION AND DEBRIDEMENT HAND / INDEX FINGER;  Surgeon: Roseanne Kaufman, MD;  [facility]  OR;  Service: Orthopedics;  Laterality: Left;   IR IMAGING GUIDED PORT INSERTION  05/06/2020   IR IMAGING GUIDED PORT INSERTION  12/04/2021   IR REMOVAL TUN ACCESS W/ PORT W/O FL MOD SED  04/27/2020   IR REMOVAL TUN ACCESS W/ PORT W/O FL MOD SED  12/04/2021   KIDNEY STONE SURGERY     LUMBAR PUNCTURE W/ INTRATHECAL CHEMOTHERAPY     MASTECTOMY     PITUITARY SURGERY     RIGHT/LEFT HEART CATH AND CORONARY ANGIOGRAPHY N/A 04/02/2018   Procedure: RIGHT/LEFT HEART CATH AND CORONARY ANGIOGRAPHY;  Surgeon: Burnell Blanks, MD;  Location: [facility];  Service: Cardiovascular;  Laterality: N/A;   TOOTH EXTRACTION N/A 12/05/2020   Procedure: DENTAL RESTORATION/EXTRACTIONS;  Surgeon: Ronal Fear, MD;  Location: [facility];  Service: Oral Surgery;  Laterality: N/A;   TOTAL THYROIDECTOMY     VIDEO BRONCHOSCOPY Bilateral 11/14/2018   Procedure: VIDEO BRONCHOSCOPY WITHOUT FLUORO;  Surgeon: Margaretha Seeds, MD;  Location: [facility];  Service: Cardiopulmonary;  Laterality: Bilateral;   VIDEO BRONCHOSCOPY WITH ENDOBRONCHIAL ULTRASOUND N/A 11/19/2018   Procedure: VIDEO BRONCHOSCOPY WITH ENDOBRONCHIAL ULTRASOUND;  Surgeon:  Margaretha Seeds, MD;  Location: [facility];  Service: Thoracic;  Laterality: N/A;    Medications: Current Outpatient Medications  Medication Sig Dispense Refill   albuterol (2.5 MG/3ML) 0.083% NEBU 3 mL, albuterol (5 MG/ML) 0.5% NEBU 0.5 mL Inhale 3 mg into the lungs every 6 (six) hours as needed (SOB).      albuterol (PROVENTIL) (2.5 MG/3ML) 0.083% nebulizer solution Take 3 mLs (2.5 mg total) by nebulization every 6 (six) hours as needed for wheezing or shortness of breath. 75 mL 12   albuterol (VENTOLIN HFA) 108 (90 Base) MCG/ACT inhaler Inhale 2 puffs into the lungs every 6 (six) hours as needed for wheezing or shortness of breath.     ALPRAZolam (XANAX) 0.5 MG tablet Take 1 tablet (0.5 mg total) by mouth 2 (two) times daily as needed for anxiety. 60 tablet 2   alum & mag hydroxide-simeth (MAALOX/MYLANTA) 200-200-20 MG/5ML suspension Take 30 mLs by mouth daily as needed for indigestion or heartburn. For Bleeding Ulcers     aspirin 325 MG tablet Take 325 mg by mouth at bedtime.      benzonatate (TESSALON) 200 MG capsule Take 1 capsule (200 mg total) by mouth 3 (three) times daily as needed for cough. 45 capsule 1   bumetanide (BUMEX) 1 MG tablet TAKE 1 TABLET BY MOUTH TWICE A DAY 180 tablet 1   buPROPion (WELLBUTRIN XL) 150 MG 24 hr tablet Take 1 tablet (150 mg total) by mouth daily. 90 tablet 1   cyclobenzaprine (FLEXERIL) 5 MG tablet Take 1 tablet (5 mg total) by mouth 3 (three) times daily as needed for muscle spasms. 30 tablet 0   dexlansoprazole (DEXILANT) 60 MG capsule Take 60 mg by mouth daily.     Diclofenac Potassium,Migraine, (CAMBIA) 50 MG PACK Take 1 packet by mouth as needed for migraine.     diphenhydrAMINE (BENADRYL) 50 MG/ML injection Inject 1 mL (50 mg total) into the vein every 6 (six) hours as needed (nausea). 25 mL 0   dronabinol (MARINOL) 2.5 MG capsule TAKE 1 CAPSULE BY MOUTH TWICE DAILY AS NEEDED 30 capsule 3   EPINEPHrine 0.3 mg/0.3 mL IJ SOAJ injection Inject 0.3 mg into  the muscle as needed for anaphylaxis. 2 each 2   escitalopram (LEXAPRO) 20 MG tablet Take 1 tablet (20 mg total) by mouth every  evening. 90 tablet 3   fluconazole (DIFLUCAN) 150 MG tablet Take 1 tablet (150 mg total) by mouth daily. If needed for yeast infection (Patient not taking: Reported on 03/02/2022) 5 tablet 0   fluticasone-salmeterol (ADVAIR HFA) 230-21 MCG/ACT inhaler Inhale 2 puffs into the lungs 2 (two) times daily. 1 each 5   Galcanezumab-gnlm 120 MG/ML SOAJ Inject 120 mg into the skin every 28 (twenty-eight) days.      heparin lock flush 100 UNIT/ML SOLN injection USE 5 MLS AS A HEPLOCK IN PORT A CATH ONCE DAILY AFTER MEDICATION ADMINISTRATION OR AS DIRECTED BY PHYSICIAN. 150 mL 1   hydrocortisone (CORTEF) 10 MG tablet Take 1-2 tablets (10-20 mg total) by mouth See admin instructions. Take 20 mg in the am and 93m in the evening (Patient taking differently: Take 10-20 mg by mouth See admin instructions. Take 20 mg by mouth in the morning and 136min the evening)     lamoTRIgine (LAMICTAL) 25 MG tablet Take three tablets at bedtime. 90 tablet 2   Lancets (ONETOUCH DELICA PLUS [id number] 3 (three) times daily. for testing     levothyroxine (SYNTHROID, LEVOTHROID) 112 MCG tablet Take 112 mcg by mouth daily before breakfast.     metoprolol succinate (TOPROL-XL) 25 MG 24 hr tablet Take 37.5 mg by mouth daily.      ONETOUCH VERIO test strip 3 (three) times daily. for testing     OXYGEN Inhale 3-4 L into the lungs continuous.     potassium chloride (KLOR-CON) 10 MEQ tablet Take 1 tablet (10 mEq total) by mouth daily for 21 days. 21 tablet 0   potassium chloride (KLOR-CON) 10 MEQ tablet Take 10 mEq by mouth daily as needed (low potassium).     predniSONE (DELTASONE) 10 MG tablet Take 4 tabs x 2 days, then 3 x 2d, then 2 x 2d, then 1 x 2d and STOP 20 tablet 0   Rimegepant Sulfate (NURTEC) 75 MG TBDP Take 75 mg by mouth daily as needed (Migraine).      rosuvastatin (CRESTOR) 10 MG tablet Take  10 mg by mouth daily.      silver sulfADIAZINE (SILVADENE) 1 % cream Apply 1 application topically daily as needed (Open Wound).      sitaGLIPtin (JANUVIA) 100 MG tablet Take 1 tablet (100 mg total) by mouth daily.     Sodium Chloride Flush (NORMAL SALINE FLUSH) 0.9 % SOLN FLUSH WITH TWO 10 ML FLUSHES IN PORT BEFORE AND AFTER FLUIDS AND MEDICATION ADMINISTRATION, TO MAINTAIN PATENCY--USE UP TO 4 TIMES DAILY. 2400 mL 1   sucralfate (CARAFATE) 1 g tablet Take 1 g by mouth daily as needed (FOR ULCERS).      sulfamethoxazole-trimethoprim (BACTRIM DS) 800-160 MG tablet Take 1 tablet by mouth 2 (two) times daily. (Patient not taking: Reported on 03/02/2022) 10 tablet 0   sulfamethoxazole-trimethoprim (BACTRIM DS) 800-160 MG tablet Take 1 tablet by mouth 2 (two) times daily. 10 tablet 0   SYNTHROID 100 MCG tablet Take 1 tablet (100 mcg total) by mouth every morning.     topiramate (TOPAMAX) 50 MG tablet Take 150 mg by mouth at bedtime.      Current Facility-Administered Medications  Medication Dose Route Frequency Provider Last Rate Last Admin   0.9 %  sodium chloride infusion   Intravenous PRN ElMargaretha SeedsMD       Mepolizumab SOLR 100 mg  100 mg Subcutaneous Q28 days ElMargaretha SeedsMD   100 mg at 08/31/19 1017  Allergies  Allergen Reactions   Ferrous Bisglycinate Chelate [Iron] Anaphylaxis    Only IV    Mushroom Extract Complex Anaphylaxis   Na Ferric Gluc Cplx In Sucrose Anaphylaxis   Cymbalta [Duloxetine Hcl] Swelling and Anxiety   Phenergan [Promethazine]     Other reaction(s): Unknown   Promethazine Hcl     Other reaction(s): Unknown   Succinylcholine Other (See Comments)    Lock Jaw   Buprenorphine Hcl Hives   Compazine Other (See Comments)    Altered mental status Aggression   Metoclopramide Other (See Comments)    Dystonia   Morphine And Related Hives   Ondansetron Hives and Rash    Other reaction(s): Unknown Other reaction(s): Unknown   Promethazine Hcl Hives    Tegaderm Ag Mesh [Silver] Rash    Old formulation only, is able to tolerate new formulation    Diagnoses:    ICD-10-CM   1. Major depressive disorder, recurrent episode, moderate ([place])  F33.1      Treatment goal plan: Patient not signing treatment plan on computer screen due to [place]. Treatment goals: Treatment goals remain on treatment plan as patient works with strategies to achieve her goals.  Progress is assessed each session and documented in the "subject" and/or "plan" section of treatment note. Long-term goal: Elevate mood and show evidence of usual energy, activities, and socialization level. Help her determine what she feels is realistic for her.  Short-term goal: Participate in social contacts and initiate communication of needs and desires.  Strategies: Describe the signs and symptoms of depression that are experienced and work to find and use helpful coping tools to better manage any depressed feelings.    Plan of Care:  This is first session for Tamera "[name]" Warth with this therapist.  Today we completed her initial evaluation for therapy and her initial treatment goal plan collaboratively. [name] is a 51 yr old, married female who lives with her husband of 70 years.  [name] was adopted at age 51 and lived with both of her adoptive parents in the same home and who continue to reside in [place].  Patient has battled cancer since age 52 along with anxiety, depression, sadness, feelings of inadequacy, some panic attacks, feeling a burden at times.  She states that she is "technically in remission but I have some heart failure issues along with chronic pulmonary issues."  Denies any current SI.  States that her cancer diagnosis affected some of her abilities in school and also included some homebound schooling but was eventually able to attend [employer] for 3 years but was overwhelmed and stopped which added to some of her resentment.  [name] married and is currently living with  her husband in [place], [place].  They have a son living with them in the home and he is 51 years old.  [name] has a daughter, age 65, who lives with her husband in [place], [place].  Patient has a brother who is 64 years old and she states "he is my rock".  Brother lives in [place], [place] and is in frequent contact with patient.  Patient adds that it is hard for her to feel supported by other people but does feel the support of her dad and brother.  As far as mental status exam, she presents with appearance is casual, behavior appropriate and motivated, motor skills relatively normal and she does take oxygen with her when she is out of the home, speech and language are  clear and coherent, affect is depressed and anxious, mood is anxious/angry/depressed/irritable/sad/angry at God, thought process is goal directed, thought content includes overthinking, is well oriented to person/place/time/date/situation/day of week/month of year and stated date of March 12, 2022.  She reports that her attention and concentration tend to be fair to poor, is having some memory issues mostly "short-term", insight includes "sometimes good and sometimes fair", judgment good and fair, impulse control good and fair.  She denies any thoughts of harming herself at this time and agreed to let me know if she began having them in the future.  Denies any substance abuse.  Educational history includes 3 years of college.  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  Reports there are no financial stressors in the home and that she is on social security disability.  States that she enjoys music and has enjoyed making some furniture in the past to her physical issues became a problem and limited her abilities.  Stressors are primarily her health problems.  She reports that her strengths include family and having a sense of some hopefulness.  Patient states that she struggles primarily with depression and  anxiety as she often sees "because of my health that I feel like a shell of myself".  We discussed this a little more at length today and plan to follow up on that in future appointments.  Did complete her initial treatment goal plan together today.  Seemed to feel more encouraged and connected by end of session and will follow up at her next visit.  Further information on this patient especially medical history can be found in sections above of this full initial evaluation.  Goal review of initial treatment goal plan and patient is in agreement.  Next appointment within 2 weeks.  This record has been created using [employer].  Chart creation errors have been sought, but may not always have been located and corrected.  Such creation errors do not reflect on the standard of medical care provided.   Shanon Ace, LCSW

## 2022-03-12 NOTE — Progress Notes (Deleted)
[facility] Counsel[facility] Initial Adult Exam  Name: [name] Date: 03/12/2022 MRN: [id number] DOB: May 15, 1971 PCP: Susan Lose, MD  Time spent: 60 minutes  Guardian/Payee:  patient  ("a former paramedic)  Paperwork requested:  No   Reason for Visit /Presenting Problem:  battling cancer since age 51, anxiety, depression, sadness, fears, feelings of inadequacy, panic attacks, feels a burden at times, "technically in remission" but "I have congestive heart failure, chronic pulmonary issues, denies any current SI, Cancer dx affected her being able to stay in school ad participated in some homebound schooling and attended [employer] 3 yrs til she stopped really trying and was overwhelmed with her cancer/scans/medication/ and other challenges.  Some resentment along with her anxiety and depression.   Mental Status Exam:    Appearance:   Casual     Behavior:  Appropriate and Motivated  Motor:  Normal  Speech/Language:   Clear and Coherent  Affect:  Depressed and anxious  Mood:  angry, anxious, depressed, irritable, and sad, angry at God  Thought process:  goal directed  Thought content:    overthinking  Sensory/Perceptual disturbances:    WNL  Orientation:  oriented to person, place, time/date, situation, day of week, month of year, year, and stated date of March 12, 2022  Attention:  Poor per patient report  Concentration:  Fair and Poor  Memory:  Some memory issues "short term"  Fund of knowledge:   Fair  Insight:    Good and Fair  Judgment:   Good and Fair  Impulse Control:  Good and Fair   Reported Symptoms:  see symptoms above  Risk Assessment: Danger to Self:  No Self-injurious Behavior: No Danger to Others: No Duty to Warn:no Physical Aggression / Violence:No  Access to Firearms a concern: No  Gang Involvement:No  Patient / guardian was educated about steps to take if suicide or homicide risk level increases between visits: denies thoughts of harming self While future  psychiatric events cannot be accurately predicted, the patient does not currently require acute inpatient psychiatric care and does not currently meet [facility] involuntary commitment criteria.  Substance Abuse History: Current substance abuse: No     Past Psychiatric History:   Previous psychological history is significant for anxiety and depression Outpatient Providers:in [place], [place] History of Psych Hospitalization:  yes around age 24  Psychological Testing:  n/a    Abuse History: Victim of No.,  n/a    Report needed: No. Victim of Neglect:No. Perpetrator of  n/a   Witness / Exposure to Domestic Violence: No   Protective Services Involvement: No  Witness to [employer] Violence:  No   Family History:  Family History  Family history unknown: Yes    Living situation: the patient (adopted) lives with their family which includes husband of 13 yrs, and son age 46.  Also has a daughter age 34 living in [place] with her husband. Husband  has a daughter by prior marriage. Both parents live together in [place].  Dad is my rock. Brother is best friend age 71 and is adopted also. Brother lives in [place], [place]. Parents had a daughter but patient not close to her at all.   Sexual Orientation:  Straight  Relationship Status: married  Name of spouse / other:n/a              If a parent, number of children / ages:see above info  Support Systems; hard to feel support from others but does feel support from brother and  dad and some from dad.  Financial Stress:  No   Income/Employment/Disability: Actor: No   Educational History: Education: some college  Religion/Sprituality/World View:   Protestant "Baptist"; not very involved in church  Any cultural differences that may affect / interfere with treatment:  not applicable   Recreation/Hobbies: music, have enjoyed making furniture til my physical status declined.  Stressors:Health problems     Strengths:  Family, struggling with hopefulness  Barriers:  my health, "I'm just a shell" due to my health   Legal History: Pending legal issue / charges:  n/a. History of legal issue / charges:  n/a  Medical History/Surgical History:Reviewed with Patient and she confirms info below.  Past Medical History:  Diagnosis Date   Addison's disease ([place])    Adrenal insufficiency ([place])    Anemia    Anxiety    Aortic stenosis    Aortic stenosis    Appendicitis 12/19/09   Appendicitis    Breast cancer (HCC)    STATUS POST BILATERAL MASTECTOMY. STATUS POST RECONSTRUCTION. SHE HAD SILICONE BREAST IMPLANTS AND THE LEFT IMPLANT IS LEAKING SLIGHTLY   Cellulitis of right middle finger 11/07/2018   Cervical cancer ([place]) 12/23/2018   Chest pain    CHF with right heart failure ([facility]) 04/17/2017   Chronic respiratory failure with hypoxia (HCC) 12/23/2018   Cough variant asthma 04/13/2019   Depression    GERD (gastroesophageal reflux disease)    takes Dexilant and carafate and gi coctail    Headache    migraines on a daily and monthly regimen    Heart murmur    History of kidney stones    Hodgkin lymphoma ([place])    STATUS POST MANTLE RADIATION   Hodgkin's lymphoma ([place])    1987   Hypertension    Hypoxia    Necrotizing fasciitis ([place]) 12/23/2018   Non-ischemic cardiomyopathy ([place])    Osteoporosis    Palpitations    Pituitary adenoma ([place]) 12/23/2018   Pneumonia    PONV (postoperative nausea and vomiting)    Pre-diabetes    per pt; no meds   Pulmonary hypertension ([place]) 12/23/2018   Raynaud phenomenon    Right heart failure ([place]) 04/17/2017   Seizures ([place])    last febrile seizure was approx 3 weeks ago per report on 12/01/2020   Sleep apnea    upcoming sleep study per pt    Supplemental oxygen dependent    3 liters   SVT (supraventricular tachycardia) (HCC)    Tachycardia    Thyroid cancer ([place])    STATUS POST SURGICAL REMOVAL-CURRENT ON THYROID REPLACEMENT    Past Surgical History:   Procedure Laterality Date   ABDOMINAL HYSTERECTOMY     AMPUTATION Left 01/30/2019   Procedure: Left Index finger amputation with flap reconstruction and repair reconstruction;  Surgeon: Roseanne Kaufman, MD;  Location: [facility];  Service: Orthopedics;  Laterality: Left;   APPENDECTOMY     breast implants and removal      breast implants but leaking      CARDIAC CATHETERIZATION  05/18/09   NORMAL CATH   COLONOSCOPY     hx of chemotherapy      hx of radiation therapy      I & D EXTREMITY Left 12/23/2018   Procedure: IRRIGATION AND DEBRIDEMENT HAND / INDEX FINGER;  Surgeon: Roseanne Kaufman, MD;  Location: [facility];  Service: Orthopedics;  Laterality: Left;   IR IMAGING GUIDED PORT INSERTION  05/06/2020   IR IMAGING GUIDED PORT  INSERTION  12/04/2021   IR REMOVAL TUN ACCESS W/ PORT W/O FL MOD SED  04/27/2020   IR REMOVAL TUN ACCESS W/ PORT W/O FL MOD SED  12/04/2021   KIDNEY STONE SURGERY     LUMBAR PUNCTURE W/ INTRATHECAL CHEMOTHERAPY     MASTECTOMY     PITUITARY SURGERY     RIGHT/LEFT HEART CATH AND CORONARY ANGIOGRAPHY N/A 04/02/2018   Procedure: RIGHT/LEFT HEART CATH AND CORONARY ANGIOGRAPHY;  Surgeon: Burnell Blanks, MD;  Location: [facility];  Service: Cardiovascular;  Laterality: N/A;   TOOTH EXTRACTION N/A 12/05/2020   Procedure: DENTAL RESTORATION/EXTRACTIONS;  Surgeon: Ronal Fear, MD;  Location: [facility];  Service: Oral Surgery;  Laterality: N/A;   TOTAL THYROIDECTOMY     VIDEO BRONCHOSCOPY Bilateral 11/14/2018   Procedure: VIDEO BRONCHOSCOPY WITHOUT FLUORO;  Surgeon: Margaretha Seeds, MD;  Location: [facility];  Service: Cardiopulmonary;  Laterality: Bilateral;   VIDEO BRONCHOSCOPY WITH ENDOBRONCHIAL ULTRASOUND N/A 11/19/2018   Procedure: VIDEO BRONCHOSCOPY WITH ENDOBRONCHIAL ULTRASOUND;  Surgeon: Margaretha Seeds, MD;  Location[facility];  Service: Thoracic;  Laterality: N/A;    Medications: Current Outpatient Medications  Medication Sig Dispense Refill   albuterol  (2.5 MG/3ML) 0.083% NEBU 3 mL, albuterol (5 MG/ML) 0.5% NEBU 0.5 mL Inhale 3 mg into the lungs every 6 (six) hours as needed (SOB).      albuterol (PROVENTIL) (2.5 MG/3ML) 0.083% nebulizer solution Take 3 mLs (2.5 mg total) by nebulization every 6 (six) hours as needed for wheezing or shortness of breath. 75 mL 12   albuterol (VENTOLIN HFA) 108 (90 Base) MCG/ACT inhaler Inhale 2 puffs into the lungs every 6 (six) hours as needed for wheezing or shortness of breath.     ALPRAZolam (XANAX) 0.5 MG tablet Take 1 tablet (0.5 mg total) by mouth 2 (two) times daily as needed for anxiety. 60 tablet 2   alum & mag hydroxide-simeth (MAALOX/MYLANTA) 200-200-20 MG/5ML suspension Take 30 mLs by mouth daily as needed for indigestion or heartburn. For Bleeding Ulcers     aspirin 325 MG tablet Take 325 mg by mouth at bedtime.      benzonatate (TESSALON) 200 MG capsule Take 1 capsule (200 mg total) by mouth 3 (three) times daily as needed for cough. 45 capsule 1   bumetanide (BUMEX) 1 MG tablet TAKE 1 TABLET BY MOUTH TWICE A DAY 180 tablet 1   buPROPion (WELLBUTRIN XL) 150 MG 24 hr tablet Take 1 tablet (150 mg total) by mouth daily. 90 tablet 1   cyclobenzaprine (FLEXERIL) 5 MG tablet Take 1 tablet (5 mg total) by mouth 3 (three) times daily as needed for muscle spasms. 30 tablet 0   dexlansoprazole (DEXILANT) 60 MG capsule Take 60 mg by mouth daily.     Diclofenac Potassium,Migraine, (CAMBIA) 50 MG PACK Take 1 packet by mouth as needed for migraine.     diphenhydrAMINE (BENADRYL) 50 MG/ML injection Inject 1 mL (50 mg total) into the vein every 6 (six) hours as needed (nausea). 25 mL 0   dronabinol (MARINOL) 2.5 MG capsule TAKE 1 CAPSULE BY MOUTH TWICE DAILY AS NEEDED 30 capsule 3   EPINEPHrine 0.3 mg/0.3 mL IJ SOAJ injection Inject 0.3 mg into the muscle as needed for anaphylaxis. 2 each 2   escitalopram (LEXAPRO) 20 MG tablet Take 1 tablet (20 mg total) by mouth every evening. 90 tablet 3   fluconazole (DIFLUCAN)  150 MG tablet Take 1 tablet (150 mg total) by mouth daily. If needed  for yeast infection (Patient not taking: Reported on 03/02/2022) 5 tablet 0   fluticasone-salmeterol (ADVAIR HFA) 230-21 MCG/ACT inhaler Inhale 2 puffs into the lungs 2 (two) times daily. 1 each 5   Galcanezumab-gnlm 120 MG/ML SOAJ Inject 120 mg into the skin every 28 (twenty-eight) days.      heparin lock flush 100 UNIT/ML SOLN injection USE 5 MLS AS A HEPLOCK IN PORT A CATH ONCE DAILY AFTER MEDICATION ADMINISTRATION OR AS DIRECTED BY PHYSICIAN. 150 mL 1   hydrocortisone (CORTEF) 10 MG tablet Take 1-2 tablets (10-20 mg total) by mouth See admin instructions. Take 20 mg in the am and 84m in the evening (Patient taking differently: Take 10-20 mg by mouth See admin instructions. Take 20 mg by mouth in the morning and 145min the evening)     lamoTRIgine (LAMICTAL) 25 MG tablet Take three tablets at bedtime. 90 tablet 2   Lancets (ONETOUCH DELICA PLUS [id number] 3 (three) times daily. for testing     levothyroxine (SYNTHROID, LEVOTHROID) 112 MCG tablet Take 112 mcg by mouth daily before breakfast.     metoprolol succinate (TOPROL-XL) 25 MG 24 hr tablet Take 37.5 mg by mouth daily.      ONETOUCH VERIO test strip 3 (three) times daily. for testing     OXYGEN Inhale 3-4 L into the lungs continuous.     potassium chloride (KLOR-CON) 10 MEQ tablet Take 1 tablet (10 mEq total) by mouth daily for 21 days. 21 tablet 0   potassium chloride (KLOR-CON) 10 MEQ tablet Take 10 mEq by mouth daily as needed (low potassium).     predniSONE (DELTASONE) 10 MG tablet Take 4 tabs x 2 days, then 3 x 2d, then 2 x 2d, then 1 x 2d and STOP 20 tablet 0   Rimegepant Sulfate (NURTEC) 75 MG TBDP Take 75 mg by mouth daily as needed (Migraine).      rosuvastatin (CRESTOR) 10 MG tablet Take 10 mg by mouth daily.      silver sulfADIAZINE (SILVADENE) 1 % cream Apply 1 application topically daily as needed (Open Wound).      sitaGLIPtin (JANUVIA) 100 MG tablet Take  1 tablet (100 mg total) by mouth daily.     Sodium Chloride Flush (NORMAL SALINE FLUSH) 0.9 % SOLN FLUSH WITH TWO 10 ML FLUSHES IN PORT BEFORE AND AFTER FLUIDS AND MEDICATION ADMINISTRATION, TO MAINTAIN PATENCY--USE UP TO 4 TIMES DAILY. 2400 mL 1   sucralfate (CARAFATE) 1 g tablet Take 1 g by mouth daily as needed (FOR ULCERS).      sulfamethoxazole-trimethoprim (BACTRIM DS) 800-160 MG tablet Take 1 tablet by mouth 2 (two) times daily. (Patient not taking: Reported on 03/02/2022) 10 tablet 0   sulfamethoxazole-trimethoprim (BACTRIM DS) 800-160 MG tablet Take 1 tablet by mouth 2 (two) times daily. 10 tablet 0   SYNTHROID 100 MCG tablet Take 1 tablet (100 mcg total) by mouth every morning.     topiramate (TOPAMAX) 50 MG tablet Take 150 mg by mouth at bedtime.      Current Facility-Administered Medications  Medication Dose Route Frequency Provider Last Rate Last Admin   0.9 %  sodium chloride infusion   Intravenous PRN ElMargaretha SeedsMD       Mepolizumab SOLR 100 mg  100 mg Subcutaneous Q28 days ElMargaretha SeedsMD   100 mg at 08/31/19 1017    Allergies  Allergen Reactions   Ferrous Bisglycinate Chelate [Iron] Anaphylaxis    Only IV    Mushroom  Extract Complex Anaphylaxis   Na Ferric Gluc Cplx In Sucrose Anaphylaxis   Cymbalta [Duloxetine Hcl] Swelling and Anxiety   Phenergan [Promethazine]     Other reaction(s): Unknown   Promethazine Hcl     Other reaction(s): Unknown   Succinylcholine Other (See Comments)    Lock Jaw   Buprenorphine Hcl Hives   Compazine Other (See Comments)    Altered mental status Aggression   Metoclopramide Other (See Comments)    Dystonia   Morphine And Related Hives   Ondansetron Hives and Rash    Other reaction(s): Unknown Other reaction(s): Unknown   Promethazine Hcl Hives   Tegaderm Ag Mesh [Silver] Rash    Old formulation only, is able to tolerate new formulation    Diagnoses:    ICD-10-CM   1. Major depressive disorder, recurrent episode,  moderate ([place])  F33.1      Treatment goal plan: Patient not signing treatment plan on computer screen due to [place]. Treatment goals: Treatment goals remain on treatment plan as patient works with strategies to achieve her goals.  Progress is assessed each session and documented in the "subject" and/or "plan" section of treatment note. Long-term goal: Elevate mood and show evidence of usual energy, activities, and socialization level. Help her determine what she feels is realistic for her.  Short-term goal: Participate in social contacts and initiate communication of needs and desires.  Strategies: Describe the signs and symptoms of depression that are experienced and work to find and use helpful coping tools to better manage any depressed feelings.    Plan of Care: ***This is first session for [name] with this therapist.  Today we completed her initial evaluation for therapy and her initial treatment goal plan collaboratively. [name] is a 51 yr old, married female who lives with her husband of 19 years.         Goal review of initial treatment goal plan and patient is in agreement.  Next appointment within 2 weeks.  This record has been created using [employer].  Chart creation errors have been sought, but may not always have been located and corrected.  Such creation errors do not reflect on the standard of medical care provided.   Shanon Ace, LCSW

## 2022-03-13 ENCOUNTER — Other Ambulatory Visit: Payer: Self-pay | Admitting: Hematology and Oncology

## 2022-03-21 DIAGNOSIS — G43009 Migraine without aura, not intractable, without status migrainosus: Secondary | ICD-10-CM | POA: Diagnosis not present

## 2022-03-22 ENCOUNTER — Other Ambulatory Visit (HOSPITAL_COMMUNITY): Payer: Self-pay | Admitting: Internal Medicine

## 2022-03-22 ENCOUNTER — Other Ambulatory Visit: Payer: Self-pay

## 2022-03-22 ENCOUNTER — Ambulatory Visit (HOSPITAL_COMMUNITY)
Admission: RE | Admit: 2022-03-22 | Discharge: 2022-03-22 | Disposition: A | Discharge status: Home / Self Care | Payer: BC Managed Care – PPO | Source: Ambulatory Visit | Attending: Internal Medicine | Admitting: Internal Medicine

## 2022-03-22 ENCOUNTER — Inpatient Hospital Stay: Payer: BC Managed Care – PPO | Attending: Hematology and Oncology

## 2022-03-22 DIAGNOSIS — Z95828 Presence of other vascular implants and grafts: Secondary | ICD-10-CM

## 2022-03-22 DIAGNOSIS — Z86000 Personal history of in-situ neoplasm of breast: Secondary | ICD-10-CM | POA: Diagnosis not present

## 2022-03-22 DIAGNOSIS — Z452 Encounter for adjustment and management of vascular access device: Secondary | ICD-10-CM | POA: Insufficient documentation

## 2022-03-22 HISTORY — PX: IR CV LINE INJECTION: IMG2294

## 2022-03-22 MED ORDER — SODIUM CHLORIDE 0.9% FLUSH: 10.0000 mL | INTRAVENOUS | Status: DC | PRN

## 2022-03-22 MED ORDER — HEPARIN SOD (PORK) LOCK FLUSH 100 UNIT/ML IV SOLN: 500.0000 [IU] | Freq: Once | INTRAVENOUS | Status: DC | PRN

## 2022-03-22 MED ORDER — HEPARIN SOD (PORK) LOCK FLUSH 100 UNIT/ML IV SOLN
INTRAVENOUS | Status: AC
  Filled 2022-03-22: qty 5

## 2022-03-22 MED ORDER — LIDOCAINE-PRILOCAINE 2.5-2.5 % EX CREA
TOPICAL_CREAM | Freq: Once | CUTANEOUS | Status: AC
  Filled 2022-03-22: qty 5

## 2022-03-22 MED ORDER — HEPARIN SOD (PORK) LOCK FLUSH 100 UNIT/ML IV SOLN
INTRAVENOUS | Status: DC | PRN
  Administered 2022-03-22: 500 [IU]

## 2022-03-22 NOTE — Progress Notes (Signed)
Pt stated her port was causing her pain, and that she saw pus come out the port on 03/21/2022. Dr. Lindi Adie and [place] LPN made aware. Dr. Lindi Adie suggested the pt go see IR. Pt agrees to see IR.

## 2022-03-22 NOTE — Progress Notes (Signed)
Pt presents to IR c/o pain at R upper chest port site. Pt states that this port has been removed and replaced once. She states that she had her port accessed yesterday with no difficulty flushing or aspirating. Pt states that last night she had pain around port and with a small drop of yellowish green liquid on dressing. She has not seen any liquid from port since. Her pain is mostly when she lifts her right arm with  extension of R axilla.   Upon exam, there is no erythema, discharge or swelling to R port. There is a small area of  red discoloration to port access area.  Per Dr. Serafina Royals, CXR ordered to confirm placement.  Emla cream ordered to access port and verify that it is functioning properly.   CXR shows port is in appropriate position.  Port accessed, flushes easily with brisk blood return.  Pt advised to contact IR if she has worsening pain or symptoms of infection.     [name], AGNP-BC 03/22/2022, 4:47 PM

## 2022-03-22 NOTE — Progress Notes (Signed)
Pt complains of pain around port site, I went and assessed her port site in the flush room.  Pt states she saw purulent drainage from site last night, tender to touch and with ROM.  Pt had her port accessed at another facility earlier this week and stated that is when symptoms began.  Per MD, I advised pt to be further evaluated with IR.  Pt was tearful and did seem very stressed but agreed to continue with MD plan to see IR later today.

## 2022-03-23 ENCOUNTER — Encounter: Payer: Self-pay | Admitting: Adult Health

## 2022-03-23 ENCOUNTER — Telehealth (INDEPENDENT_AMBULATORY_CARE_PROVIDER_SITE_OTHER): Payer: BC Managed Care – PPO | Admitting: Adult Health

## 2022-03-23 DIAGNOSIS — F411 Generalized anxiety disorder: Secondary | ICD-10-CM

## 2022-03-23 DIAGNOSIS — F331 Major depressive disorder, recurrent, moderate: Secondary | ICD-10-CM

## 2022-03-23 DIAGNOSIS — Z91199 Patient's noncompliance with other medical treatment and regimen due to unspecified reason: Secondary | ICD-10-CM

## 2022-03-23 DIAGNOSIS — G4700 Insomnia, unspecified: Secondary | ICD-10-CM

## 2022-03-23 DIAGNOSIS — F410 Panic disorder [episodic paroxysmal anxiety] without agoraphobia: Secondary | ICD-10-CM

## 2022-03-23 MED ORDER — BUPROPION HCL ER (XL) 300 MG PO TB24: 300.0000 mg | ORAL_TABLET | Freq: Every day | ORAL | 2 refills | Status: DC

## 2022-03-23 MED ORDER — LAMOTRIGINE 100 MG PO TABS: ORAL_TABLET | ORAL | 1 refills | Status: DC

## 2022-03-23 NOTE — Progress Notes (Signed)
[name] [id number] Oct 01, 1971 51 y.o.  Virtual Visit via Video Note  I connected with pt @ on 03/23/22 at  8:20 AM EDT by a video enabled telemedicine application and verified that I am speaking with the correct person using two identifiers.   I discussed the limitations of evaluation and management by telemedicine and the availability of in person appointments. The patient expressed understanding and agreed to proceed.  I discussed the assessment and treatment plan with the patient. The patient was provided an opportunity to ask questions and all were answered. The patient agreed with the plan and demonstrated an understanding of the instructions.   The patient was advised to call back or seek an in-person evaluation if the symptoms worsen or if the condition fails to improve as anticipated.  I provided 25 minutes of non-face-to-face time during this encounter.  The patient was located at home.  The provider was located at [facility].   [name], NP   Subjective:   Patient ID:  [name] is a 51 y.o. (DOB 05-20-1971) female.  Chief Complaint: No chief complaint on file.   HPI CLARRISSA SUNNY presents for follow-up of MDD, GAD, insomnia, and panic attacks.  Describes mood today as "not the best". Pleasant. Tearful at times. Mood symptoms - reports depression, anxiety, and irritability. Reports constant worry. Denies recent panic attacks. Stating "I'm not doing well". She and husband having issues. Upcoming trip to [place] to visit husband's family. Feels like medications need to be adjusted. Willing to increase the Lamictal from 50mg  to 75mg  daily and the Wellbutrin XL 150mg  to 300mg  daily. Stating "I'm not where I want to be". Started seeing [name] for therapy. 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 Also treated for multiple medical issues. Father also with prostate cancer - recent surgery. Varying interest and motivation. Taking medications as  prescribed. Energy levels low - non-existent. Active, does not have a regular exercise routine with current physical disabilities. Enjoys some usual interests and activities. Married. Lives with husband - 2 children. Spending time with family.  Appetite decreased. Weight stable 179 pounds. Sleeps better some nights than others. Averages 8 or more hours with Ambien.  Focus and concentration difficulties. Completing tasks. Managing aspects of household. Disabled in 2016 - [employer]. Denies SI or HI.  Denies AH or VH.  Previous medication trials: Wellbutrin - mean, Zoloft-not helpful   Review of Systems:  Review of Systems  Musculoskeletal:  Negative for gait problem.  Neurological:  Negative for tremors.  Psychiatric/Behavioral:         Please refer to HPI    Medications: I have reviewed the patient's current medications.  Current Outpatient Medications  Medication Sig Dispense Refill   albuterol (2.5 MG/3ML) 0.083% NEBU 3 mL, albuterol (5 MG/ML) 0.5% NEBU 0.5 mL Inhale 3 mg into the lungs every 6 (six) hours as needed (SOB).      albuterol (PROVENTIL) (2.5 MG/3ML) 0.083% nebulizer solution Take 3 mLs (2.5 mg total) by nebulization every 6 (six) hours as needed for wheezing or shortness of breath. 75 mL 12   albuterol (VENTOLIN HFA) 108 (90 Base) MCG/ACT inhaler Inhale 2 puffs into the lungs every 6 (six) hours as needed for wheezing or shortness of breath.     ALPRAZolam (XANAX) 0.5 MG tablet Take 1 tablet (0.5 mg total) by mouth 2 (two) times daily as needed for anxiety. 60 tablet 2   alum & mag hydroxide-simeth (MAALOX/MYLANTA) 200-200-20 MG/5ML  suspension Take 30 mLs by mouth daily as needed for indigestion or heartburn. For Bleeding Ulcers     aspirin 325 MG tablet Take 325 mg by mouth at bedtime.      benzonatate (TESSALON) 200 MG capsule Take 1 capsule (200 mg total) by mouth 3 (three) times daily as needed for cough. 45 capsule 1   bumetanide (BUMEX) 1 MG tablet TAKE 1  TABLET BY MOUTH TWICE A DAY 180 tablet 1   buPROPion (WELLBUTRIN XL) 300 MG 24 hr tablet Take 1 tablet (300 mg total) by mouth daily. 30 tablet 2   cyclobenzaprine (FLEXERIL) 5 MG tablet Take 1 tablet (5 mg total) by mouth 3 (three) times daily as needed for muscle spasms. 30 tablet 0   dexlansoprazole (DEXILANT) 60 MG capsule Take 60 mg by mouth daily.     Diclofenac Potassium,Migraine, (CAMBIA) 50 MG PACK Take 1 packet by mouth as needed for migraine.     diphenhydrAMINE (BENADRYL) 50 MG/ML injection Inject 1 mL (50 mg total) into the vein every 6 (six) hours as needed (nausea). 25 mL 0   dronabinol (MARINOL) 2.5 MG capsule TAKE 1 CAPSULE BY MOUTH TWICE DAILY AS NEEDED 30 capsule 3   EPINEPHrine 0.3 mg/0.3 mL IJ SOAJ injection Inject 0.3 mg into the muscle as needed for anaphylaxis. 2 each 2   escitalopram (LEXAPRO) 20 MG tablet Take 1 tablet (20 mg total) by mouth every evening. 90 tablet 3   fluconazole (DIFLUCAN) 150 MG tablet Take 1 tablet (150 mg total) by mouth daily. If needed for yeast infection (Patient not taking: Reported on 03/02/2022) 5 tablet 0   fluticasone-salmeterol (ADVAIR HFA) 230-21 MCG/ACT inhaler Inhale 2 puffs into the lungs 2 (two) times daily. 1 each 5   Galcanezumab-gnlm 120 MG/ML SOAJ Inject 120 mg into the skin every 28 (twenty-eight) days.      heparin lock flush 100 UNIT/ML SOLN injection USE 5 MLS AS A HEPLOCK IN PORT A CATH ONCE DAILY AFTER MEDICATION ADMINISTRATION OR AS DIRECTED BY PHYSICIAN. 150 mL 1   hydrocortisone (CORTEF) 10 MG tablet Take 1-2 tablets (10-20 mg total) by mouth See admin instructions. Take 20 mg in the am and 10mg  in the evening (Patient taking differently: Take 10-20 mg by mouth See admin instructions. Take 20 mg by mouth in the morning and 10mg  in the evening)     lamoTRIgine (LAMICTAL) 100 MG tablet Take one tablet at bedtime. 90 tablet 1   Lancets (ONETOUCH DELICA PLUS LANCET33G) MISC 3 (three) times daily. for testing     levothyroxine  (SYNTHROID, LEVOTHROID) 112 MCG tablet Take 112 mcg by mouth daily before breakfast.     metoprolol succinate (TOPROL-XL) 25 MG 24 hr tablet Take 37.5 mg by mouth daily.      ONETOUCH VERIO test strip 3 (three) times daily. for testing     OXYGEN Inhale 3-4 L into the lungs continuous.     potassium chloride (KLOR-CON) 10 MEQ tablet Take 1 tablet (10 mEq total) by mouth daily for 21 days. 21 tablet 0   potassium chloride (KLOR-CON) 10 MEQ tablet Take 10 mEq by mouth daily as needed (low potassium).     predniSONE (DELTASONE) 10 MG tablet Take 4 tabs x 2 days, then 3 x 2d, then 2 x 2d, then 1 x 2d and STOP 20 tablet 0   Rimegepant Sulfate (NURTEC) 75 MG TBDP Take 75 mg by mouth daily as needed (Migraine).      rosuvastatin (CRESTOR) 10 MG  tablet Take 10 mg by mouth daily.      silver sulfADIAZINE (SILVADENE) 1 % cream Apply 1 application topically daily as needed (Open Wound).      sitaGLIPtin (JANUVIA) 100 MG tablet Take 1 tablet (100 mg total) by mouth daily.     Sodium Chloride Flush (NORMAL SALINE FLUSH) 0.9 % SOLN FLUSH WITH TWO 10 ML FLUSHES IN PORT BEFORE AND AFTER FLUIDS AND MEDICATION ADMINISTRATION, TO MAINTAIN PATENCY--USE UP TO 4 TIMES DAILY. 2400 mL 1   sucralfate (CARAFATE) 1 g tablet Take 1 g by mouth daily as needed (FOR ULCERS).      sulfamethoxazole-trimethoprim (BACTRIM DS) 800-160 MG tablet Take 1 tablet by mouth 2 (two) times daily. (Patient not taking: Reported on 03/02/2022) 10 tablet 0   sulfamethoxazole-trimethoprim (BACTRIM DS) 800-160 MG tablet Take 1 tablet by mouth 2 (two) times daily. 10 tablet 0   SYNTHROID 100 MCG tablet Take 1 tablet (100 mcg total) by mouth every morning.     topiramate (TOPAMAX) 50 MG tablet Take 150 mg by mouth at bedtime.      Current Facility-Administered Medications  Medication Dose Route Frequency Provider Last Rate Last Admin   0.9 %  sodium chloride infusion   Intravenous PRN Luciano Cutter, MD       Mepolizumab SOLR 100 mg  100 mg  Subcutaneous Q28 days Luciano Cutter, MD   100 mg at 08/31/19 1017    Medication Side Effects: None  Allergies:  Allergies  Allergen Reactions   Ferrous Bisglycinate Chelate [Iron] Anaphylaxis    Only IV    Mushroom Extract Complex Anaphylaxis   Na Ferric Gluc Cplx In Sucrose Anaphylaxis   Cymbalta [Duloxetine Hcl] Swelling and Anxiety   Phenergan [Promethazine]     Other reaction(s): Unknown   Promethazine Hcl     Other reaction(s): Unknown   Succinylcholine Other (See Comments)    Lock Jaw   Buprenorphine Hcl Hives   Compazine Other (See Comments)    Altered mental status Aggression   Metoclopramide Other (See Comments)    Dystonia   Morphine And Related Hives   Ondansetron Hives and Rash    Other reaction(s): Unknown Other reaction(s): Unknown   Promethazine Hcl Hives   Tegaderm Ag Mesh [Silver] Rash    Old formulation only, is able to tolerate new formulation    Past Medical History:  Diagnosis Date   Addison's disease (HCC)    Adrenal insufficiency (HCC)    Anemia    Anxiety    Aortic stenosis    Aortic stenosis    Appendicitis 12/19/09   Appendicitis    Breast cancer (HCC)    STATUS POST BILATERAL MASTECTOMY. STATUS POST RECONSTRUCTION. SHE HAD SILICONE BREAST IMPLANTS AND THE LEFT IMPLANT IS LEAKING SLIGHTLY   Cellulitis of right middle finger 11/07/2018   Cervical cancer (HCC) 12/23/2018   Chest pain    CHF with right heart failure (HCC) 04/17/2017   Chronic respiratory failure with hypoxia (HCC) 12/23/2018   Cough variant asthma 04/13/2019   Depression    GERD (gastroesophageal reflux disease)    takes Dexilant and carafate and gi coctail    Headache    migraines on a daily and monthly regimen    Heart murmur    History of kidney stones    Hodgkin lymphoma (HCC)    STATUS POST MANTLE RADIATION   Hodgkin's lymphoma (HCC)    1987   Hypertension    Hypoxia    Necrotizing fasciitis (HCC) 12/23/2018  Non-ischemic cardiomyopathy (HCC)    Osteoporosis     Palpitations    Pituitary adenoma (HCC) 12/23/2018   Pneumonia    PONV (postoperative nausea and vomiting)    Pre-diabetes    per pt; no meds   Pulmonary hypertension (HCC) 12/23/2018   Raynaud phenomenon    Right heart failure (HCC) 04/17/2017   Seizures (HCC)    last febrile seizure was approx 3 weeks ago per report on 12/01/2020   Sleep apnea    upcoming sleep study per pt    Supplemental oxygen dependent    3 liters   SVT (supraventricular tachycardia) (HCC)    Tachycardia    Thyroid cancer (HCC)    STATUS POST SURGICAL REMOVAL-CURRENT ON THYROID REPLACEMENT    Family History  Family history unknown: Yes    Social History   Socioeconomic History   Marital status: Married    Spouse name: Not on file   Number of children: Not on file   Years of education: Not on file   Highest education level: Not on file  Occupational History   Not on file  Tobacco Use   Smoking status: Never   Smokeless tobacco: Never  Vaping Use   Vaping Use: Never used  Substance and Sexual Activity   Alcohol use: Not Currently    Comment: social    Drug use: No   Sexual activity: Yes    Birth control/protection: Surgical  Other Topics Concern   Not on file  Social History Narrative   Not on file   Social Determinants of Health   Financial Resource Strain: Not on file  Food Insecurity: Not on file  Transportation Needs: Not on file  Physical Activity: Not on file  Stress: Not on file  Social Connections: Not on file  Intimate Partner Violence: Not on file    Past Medical History, Surgical history, Social history, and Family history were reviewed and updated as appropriate.   Please see review of systems for further details on the patient's review from today.   Objective:   Physical Exam:  LMP  (LMP Unknown)   Physical Exam Constitutional:      General: She is not in acute distress. Musculoskeletal:        General: No deformity.  Neurological:     Mental Status: She is  alert and oriented to person, place, and time.     Coordination: Coordination normal.  Psychiatric:        Attention and Perception: Attention and perception normal. She does not perceive auditory or visual hallucinations.        Mood and Affect: Mood normal. Mood is not anxious or depressed. Affect is not labile, blunt, angry or inappropriate.        Speech: Speech normal.        Behavior: Behavior normal.        Thought Content: Thought content normal. Thought content is not paranoid or delusional. Thought content does not include homicidal or suicidal ideation. Thought content does not include homicidal or suicidal plan.        Cognition and Memory: Cognition and memory normal.        Judgment: Judgment normal.     Comments: Insight intact     Lab Review:     Component Value Date/Time   NA 139 02/09/2022 1202   NA 141 04/01/2018 1617   K 3.7 02/09/2022 1202   CL 105 02/09/2022 1202   CO2 26 02/09/2022 1202   GLUCOSE 104 (  H) 02/09/2022 1202   BUN 21 (H) 02/09/2022 1202   BUN 11 04/01/2018 1617   CREATININE 1.08 (H) 02/09/2022 1202   CALCIUM 9.0 02/09/2022 1202   PROT 7.9 02/09/2022 1202   PROT 7.1 01/23/2018 1225   ALBUMIN 4.0 02/09/2022 1202   ALBUMIN 4.4 01/23/2018 1225   AST 26 02/09/2022 1202   ALT 40 02/09/2022 1202   ALKPHOS 91 02/09/2022 1202   BILITOT 0.4 02/09/2022 1202   GFRNONAA >60 02/09/2022 1202   GFRAA >60 04/27/2020 0613   GFRAA >60 02/10/2020 1020       Component Value Date/Time   WBC 8.1 02/09/2022 1202   WBC 8.5 12/09/2020 0530   RBC 4.25 02/09/2022 1202   HGB 10.4 (L) 02/09/2022 1202   HGB 12.0 12/21/2019 1418   HCT 33.4 (L) 02/09/2022 1202   HCT 39.9 04/01/2018 1617   PLT 357 02/09/2022 1202   PLT 327 04/01/2018 1617   MCV 78.6 (L) 02/09/2022 1202   MCV 89 04/01/2018 1617   MCH 24.5 (L) 02/09/2022 1202   MCHC 31.1 02/09/2022 1202   RDW 16.8 (H) 02/09/2022 1202   RDW 13.6 04/01/2018 1617   LYMPHSABS 2.1 02/09/2022 1202   MONOABS 1.0  02/09/2022 1202   EOSABS 0.1 02/09/2022 1202   BASOSABS 0.1 02/09/2022 1202    No results found for: "POCLITH", "LITHIUM"   No results found for: "PHENYTOIN", "PHENOBARB", "VALPROATE", "CBMZ"   .res Assessment: Plan:    Plan:  PDMP reviewed  Lexapro 20mg  daily Xanax 0.5mg  BID Wellbutrin XL 150mg  to 300mg  every morning - denies seizure history Lamictal 75mg  to 100mg  daily  Discussed potential benefits, risk, and side effects of benzodiazepines to include potential risk of tolerance and dependence, as well as possible drowsiness.  Advised patient not to drive if experiencing drowsiness and to take lowest possible effective dose to minimize risk of dependence and tolerance.   Counseled patient regarding potential benefits, risks, and side effects of Lamictal to include potential risk of Stevens-Johnson syndrome. Advised patient to stop taking Lamictal and contact office immediately if rash develops and to seek urgent medical attention if rash is severe and/or spreading quickly. Set up with therapist  RTC 4 weeks  Patient advised to contact office with any questions, adverse effects, or acute worsening in signs and symptoms.  Diagnoses and all orders for this visit:  Failure to attend appointment  Major depressive disorder, recurrent episode, moderate (HCC) -     lamoTRIgine (LAMICTAL) 100 MG tablet; Take one tablet at bedtime. -     buPROPion (WELLBUTRIN XL) 300 MG 24 hr tablet; Take 1 tablet (300 mg total) by mouth daily.  Generalized anxiety disorder  Insomnia, unspecified type  Panic attacks     Please see After Visit Summary for patient specific instructions.  Future Appointments  Date Time Provider Department Center  04/12/2022 12:00 PM Mathis Fare, LCSW [facility] None  04/19/2022 11:00 AM [facility] None  04/19/2022 11:30 AM Serena Croissant, MD [facility] None  05/03/2022 11:45 AM [facility] FLUSH [facility] None  06/14/2022 11:45 AM [facility]  FLUSH [facility] None  07/26/2022 11:45 AM [facility] FLUSH [facility] None  09/06/2022 11:45 AM [facility] FLUSH [facility] None  10/18/2022 11:45 AM [facility] FLUSH [facility] None  11/29/2022 11:45 AM [facility] FLUSH [facility] None    No orders of the defined types were placed in this encounter.     -------------------------------

## 2022-03-25 ENCOUNTER — Encounter: Payer: Self-pay | Admitting: Internal Medicine

## 2022-03-26 ENCOUNTER — Other Ambulatory Visit: Payer: Self-pay

## 2022-03-26 ENCOUNTER — Other Ambulatory Visit: Payer: BC Managed Care – PPO

## 2022-03-26 DIAGNOSIS — Z9889 Other specified postprocedural states: Secondary | ICD-10-CM

## 2022-03-27 ENCOUNTER — Telehealth: Payer: Self-pay | Admitting: Adult Health

## 2022-03-27 ENCOUNTER — Other Ambulatory Visit: Payer: Self-pay | Admitting: Internal Medicine

## 2022-03-27 ENCOUNTER — Other Ambulatory Visit: Payer: Self-pay

## 2022-03-27 ENCOUNTER — Telehealth: Payer: Self-pay

## 2022-03-27 DIAGNOSIS — F331 Major depressive disorder, recurrent, moderate: Secondary | ICD-10-CM

## 2022-03-27 MED ORDER — SULFAMETHOXAZOLE-TRIMETHOPRIM 800-160 MG PO TABS: 1.0000 | ORAL_TABLET | Freq: Two times a day (BID) | ORAL | 0 refills | Status: DC

## 2022-03-27 MED ORDER — BUPROPION HCL ER (XL) 150 MG PO TB24: 150.0000 mg | ORAL_TABLET | Freq: Every day | ORAL | 0 refills | Status: DC

## 2022-03-27 NOTE — Telephone Encounter (Signed)
Patient notified of bupropion and alprazolam and then she asked about her Ambien. I had told her previously that we did not prescribe that for her and needed to contact that provider.

## 2022-03-27 NOTE — Telephone Encounter (Signed)
Pt called reporting she is leaving 6/29 flying to [place] will  return 7/11. Need RF on Bupropion 150 mg has 3 pills left. Bupropion 300 mg too strong.  She was so nauseous, Pt reports. Ambien due 7/2. Alprazolam due 7/9. Stated if 300 mg Bupropion didn't work Provider was to increase mg on Alprazolam. Any questions contact pt @ [phone]

## 2022-03-28 ENCOUNTER — Telehealth: Payer: Self-pay | Admitting: Internal Medicine

## 2022-03-28 ENCOUNTER — Other Ambulatory Visit: Payer: Self-pay

## 2022-03-28 ENCOUNTER — Inpatient Hospital Stay (HOSPITAL_COMMUNITY)
Admission: EM | Admit: 2022-03-28 | Discharge: 2022-03-31 | DRG: 315 | Disposition: A | Discharge status: Home / Self Care | Payer: BC Managed Care – PPO | Attending: Internal Medicine | Admitting: Internal Medicine

## 2022-03-28 ENCOUNTER — Emergency Department (HOSPITAL_COMMUNITY): Payer: BC Managed Care – PPO

## 2022-03-28 DIAGNOSIS — I110 Hypertensive heart disease with heart failure: Secondary | ICD-10-CM | POA: Diagnosis present

## 2022-03-28 DIAGNOSIS — E860 Dehydration: Secondary | ICD-10-CM | POA: Diagnosis present

## 2022-03-28 DIAGNOSIS — Z66 Do not resuscitate: Secondary | ICD-10-CM | POA: Diagnosis not present

## 2022-03-28 DIAGNOSIS — T80211A Bloodstream infection due to central venous catheter, initial encounter: Principal | ICD-10-CM | POA: Diagnosis present

## 2022-03-28 DIAGNOSIS — Z79899 Other long term (current) drug therapy: Secondary | ICD-10-CM

## 2022-03-28 DIAGNOSIS — F32A Depression, unspecified: Secondary | ICD-10-CM | POA: Diagnosis present

## 2022-03-28 DIAGNOSIS — E785 Hyperlipidemia, unspecified: Secondary | ICD-10-CM | POA: Diagnosis present

## 2022-03-28 DIAGNOSIS — D8481 Immunodeficiency due to conditions classified elsewhere: Secondary | ICD-10-CM | POA: Diagnosis not present

## 2022-03-28 DIAGNOSIS — J9611 Chronic respiratory failure with hypoxia: Secondary | ICD-10-CM | POA: Diagnosis not present

## 2022-03-28 DIAGNOSIS — Z8571 Personal history of Hodgkin lymphoma: Secondary | ICD-10-CM

## 2022-03-28 DIAGNOSIS — I7300 Raynaud's syndrome without gangrene: Secondary | ICD-10-CM | POA: Diagnosis present

## 2022-03-28 DIAGNOSIS — E876 Hypokalemia: Secondary | ICD-10-CM | POA: Diagnosis present

## 2022-03-28 DIAGNOSIS — Z89022 Acquired absence of left finger(s): Secondary | ICD-10-CM

## 2022-03-28 DIAGNOSIS — Z87442 Personal history of urinary calculi: Secondary | ICD-10-CM

## 2022-03-28 DIAGNOSIS — N179 Acute kidney failure, unspecified: Secondary | ICD-10-CM | POA: Diagnosis present

## 2022-03-28 DIAGNOSIS — I50810 Right heart failure, unspecified: Secondary | ICD-10-CM | POA: Diagnosis not present

## 2022-03-28 DIAGNOSIS — B9689 Other specified bacterial agents as the cause of diseases classified elsewhere: Secondary | ICD-10-CM | POA: Diagnosis not present

## 2022-03-28 DIAGNOSIS — I350 Nonrheumatic aortic (valve) stenosis: Secondary | ICD-10-CM | POA: Diagnosis not present

## 2022-03-28 DIAGNOSIS — Z95828 Presence of other vascular implants and grafts: Secondary | ICD-10-CM

## 2022-03-28 DIAGNOSIS — Z885 Allergy status to narcotic agent status: Secondary | ICD-10-CM

## 2022-03-28 DIAGNOSIS — Z888 Allergy status to other drugs, medicaments and biological substances status: Secondary | ICD-10-CM

## 2022-03-28 DIAGNOSIS — I428 Other cardiomyopathies: Secondary | ICD-10-CM | POA: Diagnosis present

## 2022-03-28 DIAGNOSIS — C8590 Non-Hodgkin lymphoma, unspecified, unspecified site: Secondary | ICD-10-CM | POA: Diagnosis not present

## 2022-03-28 DIAGNOSIS — Z9981 Dependence on supplemental oxygen: Secondary | ICD-10-CM | POA: Diagnosis not present

## 2022-03-28 DIAGNOSIS — R7881 Bacteremia: Secondary | ICD-10-CM | POA: Diagnosis not present

## 2022-03-28 DIAGNOSIS — G43909 Migraine, unspecified, not intractable, without status migrainosus: Secondary | ICD-10-CM | POA: Diagnosis present

## 2022-03-28 DIAGNOSIS — E271 Primary adrenocortical insufficiency: Secondary | ICD-10-CM | POA: Diagnosis not present

## 2022-03-28 DIAGNOSIS — E890 Postprocedural hypothyroidism: Secondary | ICD-10-CM | POA: Diagnosis present

## 2022-03-28 DIAGNOSIS — I2729 Other secondary pulmonary hypertension: Secondary | ICD-10-CM | POA: Diagnosis present

## 2022-03-28 DIAGNOSIS — Z9221 Personal history of antineoplastic chemotherapy: Secondary | ICD-10-CM

## 2022-03-28 DIAGNOSIS — G8929 Other chronic pain: Secondary | ICD-10-CM | POA: Diagnosis present

## 2022-03-28 DIAGNOSIS — Z7984 Long term (current) use of oral hypoglycemic drugs: Secondary | ICD-10-CM

## 2022-03-28 DIAGNOSIS — K649 Unspecified hemorrhoids: Secondary | ICD-10-CM | POA: Diagnosis present

## 2022-03-28 DIAGNOSIS — Z9013 Acquired absence of bilateral breasts and nipples: Secondary | ICD-10-CM

## 2022-03-28 DIAGNOSIS — D649 Anemia, unspecified: Secondary | ICD-10-CM

## 2022-03-28 DIAGNOSIS — Z452 Encounter for adjustment and management of vascular access device: Secondary | ICD-10-CM | POA: Diagnosis not present

## 2022-03-28 DIAGNOSIS — Z79890 Hormone replacement therapy (postmenopausal): Secondary | ICD-10-CM

## 2022-03-28 DIAGNOSIS — Z853 Personal history of malignant neoplasm of breast: Secondary | ICD-10-CM

## 2022-03-28 DIAGNOSIS — R509 Fever, unspecified: Secondary | ICD-10-CM | POA: Diagnosis present

## 2022-03-28 DIAGNOSIS — M810 Age-related osteoporosis without current pathological fracture: Secondary | ICD-10-CM | POA: Diagnosis present

## 2022-03-28 DIAGNOSIS — Z8541 Personal history of malignant neoplasm of cervix uteri: Secondary | ICD-10-CM

## 2022-03-28 DIAGNOSIS — N1831 Chronic kidney disease, stage 3a: Secondary | ICD-10-CM | POA: Diagnosis present

## 2022-03-28 DIAGNOSIS — K219 Gastro-esophageal reflux disease without esophagitis: Secondary | ICD-10-CM | POA: Diagnosis present

## 2022-03-28 DIAGNOSIS — J4540 Moderate persistent asthma, uncomplicated: Secondary | ICD-10-CM | POA: Diagnosis not present

## 2022-03-28 DIAGNOSIS — Z923 Personal history of irradiation: Secondary | ICD-10-CM

## 2022-03-28 DIAGNOSIS — Z85850 Personal history of malignant neoplasm of thyroid: Secondary | ICD-10-CM

## 2022-03-28 DIAGNOSIS — F411 Generalized anxiety disorder: Secondary | ICD-10-CM | POA: Diagnosis present

## 2022-03-28 NOTE — ED Triage Notes (Signed)
Patient coming to ED for evaluation of fever.  Had blood cultures drawn on 6/25 and was told that both came back positive for gram negative rods.  Was started on Bactrim without improvement.  Recommended to come to ED for IV antibiotics.  Has Port.  Concerned that port might be source of infection

## 2022-03-28 NOTE — ED Provider Triage Note (Signed)
Emergency Medicine Provider Triage Evaluation Note  [name] , a 51 y.o. female  was evaluated in triage.  Patient presents with concern of bacteremia.  She was sent in by infectious disease physician.  She recently had positive blood cultures.  She was started on Bactrim.  She is not feeling better and still having fevers.  ID recommends meropenem empirically.  Review of Systems  Positive: As above Negative: As above  Physical Exam  BP 126/89 (BP Location: Left Arm)   Pulse 97   Temp 98.2 F (36.8 C) (Oral)   Resp 18   Ht _0  (1.651 m)   Wt 80.3 kg   LMP  (LMP Unknown)   SpO2 100%   BMI 29.45 kg/m  Gen:   Awake, no distress   Resp:  Normal effort  MSK:   Moves extremities without difficulty  Other:    Medical Decision Making  Medically screening exam initiated at 11:36 PM.  Appropriate orders placed.  [name] was informed that the remainder of the evaluation will be completed by another provider, this initial triage assessment does not replace that evaluation, and the importance of remaining in the ED until their evaluation is complete.     Evlyn Courier, PA-C 03/28/22 [id number]

## 2022-03-28 NOTE — Telephone Encounter (Signed)
Infectious Disease Note:  I was called by patient this evening to the after hours pager.  She had blood cultures drawn in our clinic on 6/25 both of which were drawn from a peripheral site.  These both turned positive for GNR and Dr Linus Salmons called her in Bactrim which she started taking yesterday.  She was feeling better after starting this but this evening has been feeling poorly and took her temperature which was elevated above 101 degrees F.  She is tearful and anxious on the phone about what is going on.  She has a flight to [place] tomorrow at [employer].  I voiced to her my concern that she is having fevers after 24 hrs of Bactrim PO and that she is feeling so poorly with a central line that remains in place.  I advised her to go to the ED for IV antibiotics and she is agreeable to do so.  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  Although this was in 2021, would recommend Meropenem empirically upon ED arrival and therapy can be tailored after susceptibilities are available.     [name] for Infectious Disease [facility] 03/28/2022, 9:13 PM

## 2022-03-29 ENCOUNTER — Encounter (HOSPITAL_COMMUNITY): Payer: Self-pay | Admitting: Internal Medicine

## 2022-03-29 DIAGNOSIS — B9689 Other specified bacterial agents as the cause of diseases classified elsewhere: Secondary | ICD-10-CM

## 2022-03-29 DIAGNOSIS — E890 Postprocedural hypothyroidism: Secondary | ICD-10-CM | POA: Diagnosis present

## 2022-03-29 DIAGNOSIS — K649 Unspecified hemorrhoids: Secondary | ICD-10-CM | POA: Diagnosis present

## 2022-03-29 DIAGNOSIS — I7300 Raynaud's syndrome without gangrene: Secondary | ICD-10-CM | POA: Diagnosis present

## 2022-03-29 DIAGNOSIS — N179 Acute kidney failure, unspecified: Secondary | ICD-10-CM | POA: Diagnosis present

## 2022-03-29 DIAGNOSIS — Z66 Do not resuscitate: Secondary | ICD-10-CM | POA: Diagnosis not present

## 2022-03-29 DIAGNOSIS — D8481 Immunodeficiency due to conditions classified elsewhere: Secondary | ICD-10-CM | POA: Diagnosis present

## 2022-03-29 DIAGNOSIS — R7881 Bacteremia: Secondary | ICD-10-CM

## 2022-03-29 DIAGNOSIS — T80211A Bloodstream infection due to central venous catheter, initial encounter: Secondary | ICD-10-CM | POA: Diagnosis present

## 2022-03-29 DIAGNOSIS — E271 Primary adrenocortical insufficiency: Secondary | ICD-10-CM | POA: Diagnosis present

## 2022-03-29 DIAGNOSIS — I428 Other cardiomyopathies: Secondary | ICD-10-CM | POA: Diagnosis present

## 2022-03-29 DIAGNOSIS — I2729 Other secondary pulmonary hypertension: Secondary | ICD-10-CM | POA: Diagnosis present

## 2022-03-29 DIAGNOSIS — M810 Age-related osteoporosis without current pathological fracture: Secondary | ICD-10-CM | POA: Diagnosis present

## 2022-03-29 DIAGNOSIS — I350 Nonrheumatic aortic (valve) stenosis: Secondary | ICD-10-CM | POA: Diagnosis present

## 2022-03-29 DIAGNOSIS — I110 Hypertensive heart disease with heart failure: Secondary | ICD-10-CM | POA: Diagnosis present

## 2022-03-29 DIAGNOSIS — J4540 Moderate persistent asthma, uncomplicated: Secondary | ICD-10-CM | POA: Diagnosis present

## 2022-03-29 DIAGNOSIS — G8929 Other chronic pain: Secondary | ICD-10-CM | POA: Diagnosis present

## 2022-03-29 DIAGNOSIS — I50810 Right heart failure, unspecified: Secondary | ICD-10-CM | POA: Diagnosis present

## 2022-03-29 DIAGNOSIS — D649 Anemia, unspecified: Secondary | ICD-10-CM

## 2022-03-29 DIAGNOSIS — Z9981 Dependence on supplemental oxygen: Secondary | ICD-10-CM | POA: Diagnosis not present

## 2022-03-29 DIAGNOSIS — J9611 Chronic respiratory failure with hypoxia: Secondary | ICD-10-CM | POA: Diagnosis present

## 2022-03-29 DIAGNOSIS — E860 Dehydration: Secondary | ICD-10-CM | POA: Diagnosis present

## 2022-03-29 DIAGNOSIS — E876 Hypokalemia: Secondary | ICD-10-CM | POA: Diagnosis present

## 2022-03-29 DIAGNOSIS — E785 Hyperlipidemia, unspecified: Secondary | ICD-10-CM | POA: Diagnosis present

## 2022-03-29 DIAGNOSIS — F32A Depression, unspecified: Secondary | ICD-10-CM | POA: Diagnosis present

## 2022-03-29 DIAGNOSIS — C8590 Non-Hodgkin lymphoma, unspecified, unspecified site: Secondary | ICD-10-CM | POA: Diagnosis present

## 2022-03-29 LAB — COMPREHENSIVE METABOLIC PANEL
ALT: 35 U/L (ref 0–44)
ALT: 32 U/L (ref 0–44)
AST: 23 U/L (ref 15–41)
AST: 21 U/L (ref 15–41)
Albumin: 4.1 g/dL (ref 3.5–5.0)
Albumin: 3.4 g/dL — ABNORMAL LOW (ref 3.5–5.0)
Alkaline Phosphatase: 87 U/L (ref 38–126)
Alkaline Phosphatase: 83 U/L (ref 38–126)
Anion gap: 11 (ref 5–15)
Anion gap: 9 (ref 5–15)
BUN: 20 mg/dL (ref 6–20)
BUN: 17 mg/dL (ref 6–20)
CO2: 21 mmol/L — ABNORMAL LOW (ref 22–32)
CO2: 21 mmol/L — ABNORMAL LOW (ref 22–32)
Calcium: 9 mg/dL (ref 8.9–10.3)
Calcium: 8.3 mg/dL — ABNORMAL LOW (ref 8.9–10.3)
Chloride: 109 mmol/L (ref 98–111)
Chloride: 112 mmol/L — ABNORMAL HIGH (ref 98–111)
Creatinine, Ser: 1.51 mg/dL — ABNORMAL HIGH (ref 0.44–1.00)
Creatinine, Ser: 1.35 mg/dL — ABNORMAL HIGH (ref 0.44–1.00)
GFR, Estimated: 42 mL/min — ABNORMAL LOW (ref >60)
GFR, Estimated: 48 mL/min — ABNORMAL LOW (ref >60)
Glucose, Bld: 147 mg/dL — ABNORMAL HIGH (ref 70–99)
Glucose, Bld: 90 mg/dL (ref 70–99)
Potassium: 3.5 mmol/L (ref 3.5–5.1)
Potassium: 3.4 mmol/L — ABNORMAL LOW (ref 3.5–5.1)
Sodium: 141 mmol/L (ref 135–145)
Sodium: 142 mmol/L (ref 135–145)
Total Bilirubin: 0.3 mg/dL (ref 0.3–1.2)
Total Bilirubin: 0.3 mg/dL (ref 0.3–1.2)
Total Protein: 8.3 g/dL — ABNORMAL HIGH (ref 6.5–8.1)
Total Protein: 7.1 g/dL (ref 6.5–8.1)

## 2022-03-29 LAB — CBC WITH DIFFERENTIAL/PLATELET
Abs Immature Granulocytes: 0.03 10*3/uL (ref 0.00–0.07)
Abs Immature Granulocytes: 0.02 10*3/uL (ref 0.00–0.07)
Basophils Absolute: 0.1 10*3/uL (ref 0.0–0.1)
Basophils Absolute: 0.1 10*3/uL (ref 0.0–0.1)
Basophils Relative: 1 %
Basophils Relative: 1 %
Eosinophils Absolute: 0.1 10*3/uL (ref 0.0–0.5)
Eosinophils Absolute: 0.2 10*3/uL (ref 0.0–0.5)
Eosinophils Relative: 1 %
Eosinophils Relative: 2 %
HCT: 33 % — ABNORMAL LOW (ref 36.0–46.0)
HCT: 29.8 % — ABNORMAL LOW (ref 36.0–46.0)
Hemoglobin: 10.1 g/dL — ABNORMAL LOW (ref 12.0–15.0)
Hemoglobin: 9.3 g/dL — ABNORMAL LOW (ref 12.0–15.0)
Immature Granulocytes: 0 %
Immature Granulocytes: 0 %
Lymphocytes Relative: 24 %
Lymphocytes Relative: 26 %
Lymphs Abs: 2 10*3/uL (ref 0.7–4.0)
Lymphs Abs: 2.2 10*3/uL (ref 0.7–4.0)
MCH: 23.9 pg — ABNORMAL LOW (ref 26.0–34.0)
MCH: 24.7 pg — ABNORMAL LOW (ref 26.0–34.0)
MCHC: 30.6 g/dL (ref 30.0–36.0)
MCHC: 31.2 g/dL (ref 30.0–36.0)
MCV: 78.2 fL — ABNORMAL LOW (ref 80.0–100.0)
MCV: 79.3 fL — ABNORMAL LOW (ref 80.0–100.0)
Monocytes Absolute: 0.7 10*3/uL (ref 0.1–1.0)
Monocytes Absolute: 0.9 10*3/uL (ref 0.1–1.0)
Monocytes Relative: 8 %
Monocytes Relative: 11 %
Neutro Abs: 5.6 10*3/uL (ref 1.7–7.7)
Neutro Abs: 5 10*3/uL (ref 1.7–7.7)
Neutrophils Relative %: 66 %
Neutrophils Relative %: 60 %
Platelets: 348 10*3/uL (ref 150–400)
Platelets: 311 10*3/uL (ref 150–400)
RBC: 4.22 MIL/uL (ref 3.87–5.11)
RBC: 3.76 MIL/uL — ABNORMAL LOW (ref 3.87–5.11)
RDW: 17.1 % — ABNORMAL HIGH (ref 11.5–15.5)
RDW: 17.1 % — ABNORMAL HIGH (ref 11.5–15.5)
WBC: 8.6 10*3/uL (ref 4.0–10.5)
WBC: 8.4 10*3/uL (ref 4.0–10.5)
nRBC: 0 % (ref 0.0–0.2)
nRBC: 0 % (ref 0.0–0.2)

## 2022-03-29 LAB — URINALYSIS, ROUTINE W REFLEX MICROSCOPIC
Bilirubin Urine: NEGATIVE
Glucose, UA: NEGATIVE mg/dL
Hgb urine dipstick: NEGATIVE
Ketones, ur: NEGATIVE mg/dL
Leukocytes,Ua: NEGATIVE
Nitrite: NEGATIVE
Protein, ur: NEGATIVE mg/dL
Specific Gravity, Urine: 1.006 (ref 1.005–1.030)
pH: 5 (ref 5.0–8.0)

## 2022-03-29 LAB — PHOSPHORUS: Phosphorus: 4 mg/dL (ref 2.5–4.6)

## 2022-03-29 LAB — MAGNESIUM: Magnesium: 2.2 mg/dL (ref 1.7–2.4)

## 2022-03-29 MED ORDER — HYDROCORTISONE 10 MG PO TABS: 10.0000 mg | ORAL_TABLET | ORAL | Status: DC

## 2022-03-29 MED ORDER — SODIUM CHLORIDE 0.9 % IV SOLN
2.0000 g | Freq: Two times a day (BID) | INTRAVENOUS | Status: DC
  Administered 2022-03-29 – 2022-03-30 (×3): 2 g via INTRAVENOUS
  Filled 2022-03-29 (×3): qty 12.5

## 2022-03-29 MED ORDER — POTASSIUM CHLORIDE CRYS ER 20 MEQ PO TBCR
40.0000 meq | EXTENDED_RELEASE_TABLET | Freq: Once | ORAL | Status: AC
  Administered 2022-03-29: 40 meq via ORAL
  Filled 2022-03-29: qty 2

## 2022-03-29 MED ORDER — MOMETASONE FURO-FORMOTEROL FUM 200-5 MCG/ACT IN AERO
2.0000 | INHALATION_SPRAY | Freq: Two times a day (BID) | RESPIRATORY_TRACT | Status: DC
  Administered 2022-03-29 – 2022-03-31 (×4): 2 via RESPIRATORY_TRACT
  Filled 2022-03-29: qty 8.8

## 2022-03-29 MED ORDER — LAMOTRIGINE 100 MG PO TABS
100.0000 mg | ORAL_TABLET | Freq: Every day | ORAL | Status: DC
  Administered 2022-03-29 – 2022-03-30 (×2): 100 mg via ORAL
  Filled 2022-03-29 (×2): qty 1

## 2022-03-29 MED ORDER — SODIUM CHLORIDE 0.9 % IV SOLN
1.0000 g | Freq: Once | INTRAVENOUS | Status: AC
  Administered 2022-03-29: 1 g via INTRAVENOUS
  Filled 2022-03-29: qty 20

## 2022-03-29 MED ORDER — BUPROPION HCL ER (XL) 150 MG PO TB24
150.0000 mg | ORAL_TABLET | Freq: Every day | ORAL | Status: DC
  Administered 2022-03-29 – 2022-03-31 (×3): 150 mg via ORAL
  Filled 2022-03-29 (×3): qty 1

## 2022-03-29 MED ORDER — LACTATED RINGERS IV SOLN: INTRAVENOUS | Status: AC

## 2022-03-29 MED ORDER — DIPHENHYDRAMINE HCL 50 MG/ML IJ SOLN
50.0000 mg | Freq: Four times a day (QID) | INTRAMUSCULAR | Status: DC | PRN
  Administered 2022-03-29 – 2022-03-31 (×7): 50 mg via INTRAVENOUS
  Filled 2022-03-29 (×7): qty 1

## 2022-03-29 MED ORDER — ALBUTEROL SULFATE (2.5 MG/3ML) 0.083% IN NEBU
3.0000 mL | INHALATION_SOLUTION | Freq: Four times a day (QID) | RESPIRATORY_TRACT | Status: DC | PRN
Start: 2022-03-29 — End: 2022-03-29

## 2022-03-29 MED ORDER — DRONABINOL 2.5 MG PO CAPS: 2.5000 mg | ORAL_CAPSULE | Freq: Two times a day (BID) | ORAL | Status: DC | PRN

## 2022-03-29 MED ORDER — SODIUM CHLORIDE 0.9% FLUSH: 10.0000 mL | INTRAVENOUS | Status: DC | PRN

## 2022-03-29 MED ORDER — ASPIRIN 325 MG PO TABS
325.0000 mg | ORAL_TABLET | Freq: Every day | ORAL | Status: DC
Start: 2022-03-29 — End: 2022-03-31
  Administered 2022-03-29 – 2022-03-30 (×2): 325 mg via ORAL
  Filled 2022-03-29 (×2): qty 1

## 2022-03-29 MED ORDER — SODIUM CHLORIDE 0.9 % IV SOLN: 1.0000 g | Freq: Two times a day (BID) | INTRAVENOUS | Status: DC

## 2022-03-29 MED ORDER — HYDROCORTISONE 20 MG PO TABS
20.0000 mg | ORAL_TABLET | Freq: Every day | ORAL | Status: DC
  Administered 2022-03-29 – 2022-03-31 (×3): 20 mg via ORAL
  Filled 2022-03-29 (×3): qty 1

## 2022-03-29 MED ORDER — ALBUTEROL SULFATE (2.5 MG/3ML) 0.083% IN NEBU
2.5000 mg | INHALATION_SOLUTION | Freq: Four times a day (QID) | RESPIRATORY_TRACT | Status: DC | PRN
  Administered 2022-03-29: 2.5 mg via RESPIRATORY_TRACT
  Filled 2022-03-29: qty 3

## 2022-03-29 MED ORDER — PANTOPRAZOLE SODIUM 40 MG IV SOLR
40.0000 mg | INTRAVENOUS | Status: DC
Start: 2022-03-29 — End: 2022-03-29

## 2022-03-29 MED ORDER — ENOXAPARIN SODIUM 40 MG/0.4ML IJ SOSY
40.0000 mg | PREFILLED_SYRINGE | INTRAMUSCULAR | Status: DC
  Filled 2022-03-29 (×3): qty 0.4

## 2022-03-29 MED ORDER — HYDROMORPHONE HCL 2 MG PO TABS: 1.0000 mg | ORAL_TABLET | Freq: Four times a day (QID) | ORAL | Status: DC | PRN

## 2022-03-29 MED ORDER — DIPHENHYDRAMINE HCL 25 MG PO CAPS
25.0000 mg | ORAL_CAPSULE | Freq: Once | ORAL | Status: AC | PRN
  Administered 2022-03-29: 25 mg via ORAL
  Filled 2022-03-29: qty 1

## 2022-03-29 MED ORDER — HYDROCORTISONE 10 MG PO TABS
10.0000 mg | ORAL_TABLET | Freq: Every day | ORAL | Status: DC
  Administered 2022-03-29 – 2022-03-30 (×2): 10 mg via ORAL
  Filled 2022-03-29 (×3): qty 1

## 2022-03-29 MED ORDER — LORAZEPAM 2 MG/ML IJ SOLN
1.0000 mg | Freq: Once | INTRAMUSCULAR | Status: AC
Start: 2022-03-29 — End: 2022-03-29
  Administered 2022-03-29: 1 mg via INTRAVENOUS
  Filled 2022-03-29: qty 1

## 2022-03-29 MED ORDER — RIMEGEPANT SULFATE 75 MG PO TBDP: 75.0000 mg | ORAL_TABLET | Freq: Every day | ORAL | Status: DC | PRN

## 2022-03-29 MED ORDER — ACETAMINOPHEN 325 MG PO TABS: 650.0000 mg | ORAL_TABLET | Freq: Four times a day (QID) | ORAL | Status: DC | PRN

## 2022-03-29 MED ORDER — ESCITALOPRAM OXALATE 20 MG PO TABS
20.0000 mg | ORAL_TABLET | Freq: Every evening | ORAL | Status: DC
  Administered 2022-03-29 – 2022-03-30 (×2): 20 mg via ORAL
  Filled 2022-03-29 (×2): qty 1

## 2022-03-29 MED ORDER — SUCRALFATE 1 G PO TABS: 1.0000 g | ORAL_TABLET | Freq: Every day | ORAL | Status: DC | PRN

## 2022-03-29 MED ORDER — HEPARIN SOD (PORK) LOCK FLUSH 100 UNIT/ML IV SOLN
500.0000 [IU] | Freq: Once | INTRAVENOUS | Status: AC
  Administered 2022-03-29: 500 [IU]
  Filled 2022-03-29: qty 5

## 2022-03-29 MED ORDER — ALPRAZOLAM 0.5 MG PO TABS
0.5000 mg | ORAL_TABLET | Freq: Two times a day (BID) | ORAL | Status: DC | PRN
  Administered 2022-03-30 – 2022-03-31 (×2): 0.5 mg via ORAL
  Filled 2022-03-29 (×2): qty 1

## 2022-03-29 MED ORDER — LEVOTHYROXINE SODIUM 100 MCG PO TABS
100.0000 ug | ORAL_TABLET | Freq: Every morning | ORAL | Status: DC
  Administered 2022-03-29 – 2022-03-31 (×3): 100 ug via ORAL
  Filled 2022-03-29 (×4): qty 1

## 2022-03-29 MED ORDER — HYDROMORPHONE HCL 1 MG/ML PO LIQD: 0.5000 mg | Freq: Four times a day (QID) | ORAL | Status: DC | PRN

## 2022-03-29 MED ORDER — SODIUM CHLORIDE 0.9% FLUSH
10.0000 mL | Freq: Two times a day (BID) | INTRAVENOUS | Status: DC
  Administered 2022-03-29 – 2022-03-31 (×4): 10 mL

## 2022-03-29 MED ORDER — DEXLANSOPRAZOLE 60 MG PO CPDR
60.0000 mg | DELAYED_RELEASE_CAPSULE | Freq: Every day | ORAL | Status: DC
  Administered 2022-03-29 – 2022-03-31 (×3): 60 mg via ORAL
  Filled 2022-03-29 (×4): qty 1

## 2022-03-29 MED ORDER — HYDROMORPHONE HCL 2 MG PO TABS
1.0000 mg | ORAL_TABLET | Freq: Three times a day (TID) | ORAL | Status: DC | PRN
  Administered 2022-03-29 – 2022-03-30 (×4): 1 mg via ORAL
  Filled 2022-03-29 (×4): qty 1

## 2022-03-29 MED ORDER — ZOLPIDEM TARTRATE 5 MG PO TABS
5.0000 mg | ORAL_TABLET | Freq: Every evening | ORAL | Status: DC | PRN
  Administered 2022-03-29 – 2022-03-30 (×2): 5 mg via ORAL
  Filled 2022-03-29 (×2): qty 1

## 2022-03-29 MED ORDER — FLUCONAZOLE 100 MG PO TABS
100.0000 mg | ORAL_TABLET | Freq: Every day | ORAL | Status: DC
  Administered 2022-03-29 – 2022-03-31 (×3): 100 mg via ORAL
  Filled 2022-03-29 (×3): qty 1

## 2022-03-29 MED ORDER — TOPIRAMATE 25 MG PO TABS
150.0000 mg | ORAL_TABLET | Freq: Every day | ORAL | Status: DC
  Administered 2022-03-29 – 2022-03-30 (×2): 150 mg via ORAL
  Filled 2022-03-29 (×2): qty 2

## 2022-03-29 MED ORDER — SODIUM CHLORIDE 0.9 % IV BOLUS
1000.0000 mL | Freq: Once | INTRAVENOUS | Status: AC
  Administered 2022-03-29: 1000 mL via INTRAVENOUS

## 2022-03-29 MED ORDER — ROSUVASTATIN CALCIUM 10 MG PO TABS
10.0000 mg | ORAL_TABLET | Freq: Every day | ORAL | Status: DC
  Administered 2022-03-29 – 2022-03-31 (×3): 10 mg via ORAL
  Filled 2022-03-29 (×3): qty 1

## 2022-03-29 MED ORDER — METOPROLOL SUCCINATE ER 25 MG PO TB24
37.5000 mg | ORAL_TABLET | Freq: Every day | ORAL | Status: DC
  Administered 2022-03-29 – 2022-03-31 (×3): 37.5 mg via ORAL
  Filled 2022-03-29 (×2): qty 2
  Filled 2022-03-29: qty 1.5

## 2022-03-29 MED ORDER — CHLORHEXIDINE GLUCONATE CLOTH 2 % EX PADS: 6.0000 | MEDICATED_PAD | Freq: Every day | CUTANEOUS | Status: DC

## 2022-03-29 NOTE — Hospital Course (Addendum)
51 y.o.f w/ history significant for breast cancer status post bilateral mastectomy, not currently on chemotherapy, NHL,W/ Port-A-Cath, Addison's disease, who recently grew out gram-negative bacilli on blood culture drawn on 03/26/2022 as outpatient due to recurrent fevers and was started on Bactrim and had taken 2 doses of the antibiotics, and was having to persistent fevers with Tmax 101, and was asked by Dr. Juleen China to go to ED.  in ED-Tmax 98.2.  BP 125/68, pulse 94, respiratory 18, saturation 100% on room air.  Lab studies remarkable for Enterobacter aerogenes and gram-negative bacilli isolated bacteremia.  Serum bicarb 21, glucose 147, creatinine 1.51 with baseline creatinine 1.08.  GFR 42 with baseline creatinine of greater than 60.  Hemoglobin 10.1 with baseline hemoglobin 11.5.  MCV 78.  Platelet 348.  Started on meropenem and admitted.Has remained afebrile since admission. Seen by ID and IR subsequently underwent right chest port removal given concern for line associated bacteremia, PICC line was placed on the left arm due to patient's continuous need for IV medication at home At this point patient hemoglobin stable, AKI is resolved, afebrile.  As per ID recommendation she will be discharged home, she will continue :on Levaquin 762m PO daily x 10 days from date of Port removal.[facility] but otherwise will just need IR follow up regarding her PICC and new Port in the future.

## 2022-03-29 NOTE — H&P (Signed)
History and Physical  [name] ION:[id number] DOB: 11-Mar-1971 DOA: 03/28/2022  Referring physician: Dr. Leonette Monarch, [place]  PCP: Nicholas Lose, MD  Outpatient Specialists: ID Patient coming from: Home, referred by ID clinic  Chief Complaint: Bacteremia  HPI: [name] is a 51 y.o. female with medical history significant for breast cancer status post bilateral mastectomy, not currently on chemotherapy, non-Hodgkin lymphoma, requiring Port-A-Cath, Addison's disease, who recently grew out gram-negative bacilli on blood culture drawn on 03/26/2022.  Blood cultures were obtained outpatient due to recurrent fevers.  She was started on Bactrim and took 2 doses of the antibiotics prior to presentation to the ED.    Due to persistent fevers with Tmax 101, she called infectious disease.  She spoke with Dr. Juleen China who recommended that she presents to the ED for further evaluation and to start on meropenem until results of sensitivities return to narrow down antibiotics.  [facility], hospitalist service, was asked to admit.   ED Course: Tmax 98.2.  BP 125/68, pulse 94, respiratory 18, saturation 100% on room air.  Lab studies remarkable for Enterobacter aerogenes and gram-negative bacilli isolated bacteremia.  Serum bicarb 21, glucose 147, creatinine 1.51 with baseline creatinine 1.08.  GFR 42 with baseline creatinine of greater than 60.  Hemoglobin 10.1 with baseline hemoglobin 11.5.  MCV 78.  Platelet 348.  Review of Systems: Review of systems as noted in the HPI. All other systems reviewed and are negative.   Past Medical History:  Diagnosis Date   Addison's disease ([place])    Adrenal insufficiency ([place])    Anemia    Anxiety    Aortic stenosis    Aortic stenosis    Appendicitis 12/19/09   Appendicitis    Breast cancer (HCC)    STATUS POST BILATERAL MASTECTOMY. STATUS POST RECONSTRUCTION. SHE HAD SILICONE BREAST IMPLANTS AND THE LEFT IMPLANT IS LEAKING SLIGHTLY   Cellulitis of right middle finger  11/07/2018   Cervical cancer ([place]) 12/23/2018   Chest pain    CHF with right heart failure ([place]) 04/17/2017   Chronic respiratory failure with hypoxia (HCC) 12/23/2018   Cough variant asthma 04/13/2019   Depression    GERD (gastroesophageal reflux disease)    takes Dexilant and carafate and gi coctail    Headache    migraines on a daily and monthly regimen    Heart murmur    History of kidney stones    Hodgkin lymphoma ([place])    STATUS POST MANTLE RADIATION   Hodgkin's lymphoma ([place])    1987   Hypertension    Hypoxia    Necrotizing fasciitis ([place]) 12/23/2018   Non-ischemic cardiomyopathy ([place])    Osteoporosis    Palpitations    Pituitary adenoma ([place]) 12/23/2018   Pneumonia    PONV (postoperative nausea and vomiting)    Pre-diabetes    per pt; no meds   Pulmonary hypertension ([place]) 12/23/2018   Raynaud phenomenon    Right heart failure ([place]) 04/17/2017   Seizures ([place])    last febrile seizure was approx 3 weeks ago per report on 12/01/2020   Sleep apnea    upcoming sleep study per pt    Supplemental oxygen dependent    3 liters   SVT (supraventricular tachycardia) (HCC)    Tachycardia    Thyroid cancer ([place])    STATUS POST SURGICAL REMOVAL-CURRENT ON THYROID REPLACEMENT   Past Surgical History:  Procedure Laterality Date   ABDOMINAL HYSTERECTOMY     AMPUTATION Left 01/30/2019   Procedure: Left Index  finger amputation with flap reconstruction and repair reconstruction;  Surgeon: Roseanne Kaufman, MD;  Location: [facility];  Service: Orthopedics;  Laterality: Left;   APPENDECTOMY     breast implants and removal      breast implants but leaking      CARDIAC CATHETERIZATION  05/18/09   NORMAL CATH   COLONOSCOPY     hx of chemotherapy      hx of radiation therapy      I & D EXTREMITY Left 12/23/2018   Procedure: IRRIGATION AND DEBRIDEMENT HAND / INDEX FINGER;  Surgeon: Roseanne Kaufman, MD;  Location: [facility];  Service: Orthopedics;  Laterality: Left;   IR CV LINE INJECTION  03/22/2022   IR  IMAGING GUIDED PORT INSERTION  05/06/2020   IR IMAGING GUIDED PORT INSERTION  12/04/2021   IR REMOVAL TUN ACCESS W/ PORT W/O FL MOD SED  04/27/2020   IR REMOVAL TUN ACCESS W/ PORT W/O FL MOD SED  12/04/2021   KIDNEY STONE SURGERY     LUMBAR PUNCTURE W/ INTRATHECAL CHEMOTHERAPY     MASTECTOMY     PITUITARY SURGERY     RIGHT/LEFT HEART CATH AND CORONARY ANGIOGRAPHY N/A 04/02/2018   Procedure: RIGHT/LEFT HEART CATH AND CORONARY ANGIOGRAPHY;  Surgeon: Burnell Blanks, MD;  Location: [facility];  Service: Cardiovascular;  Laterality: N/A;   TOOTH EXTRACTION N/A 12/05/2020   Procedure: DENTAL RESTORATION/EXTRACTIONS;  Surgeon: Ronal Fear, MD;  Location: [facility];  Service: Oral Surgery;  Laterality: N/A;   TOTAL THYROIDECTOMY     VIDEO BRONCHOSCOPY Bilateral 11/14/2018   Procedure: VIDEO BRONCHOSCOPY WITHOUT FLUORO;  Surgeon: Margaretha Seeds, MD;  Location: [facility];  Service: Cardiopulmonary;  Laterality: Bilateral;   VIDEO BRONCHOSCOPY WITH ENDOBRONCHIAL ULTRASOUND N/A 11/19/2018   Procedure: VIDEO BRONCHOSCOPY WITH ENDOBRONCHIAL ULTRASOUND;  Surgeon: Margaretha Seeds, MD;  Location: [facility];  Service: Thoracic;  Laterality: N/A;    Social History:  reports that she has never smoked. She has never used smokeless tobacco. She reports that she does not currently use alcohol. She reports that she does not use drugs.   Allergies  Allergen Reactions   Ferrous Bisglycinate Chelate [Iron] Anaphylaxis    Only IV    Mushroom Extract Complex Anaphylaxis   Na Ferric Gluc Cplx In Sucrose Anaphylaxis   Cymbalta [Duloxetine Hcl] Swelling and Anxiety   Phenergan [Promethazine]     Other reaction(s): Unknown   Promethazine Hcl     Other reaction(s): Unknown   Succinylcholine Other (See Comments)    Lock Jaw   Buprenorphine Hcl Hives   Compazine Other (See Comments)    Altered mental status Aggression   Metoclopramide Other (See Comments)    Dystonia   Morphine And Related  Hives   Ondansetron Hives and Rash    Other reaction(s): Unknown Other reaction(s): Unknown   Promethazine Hcl Hives   Tegaderm Ag Mesh [Silver] Rash    Old formulation only, is able to tolerate new formulation    Family History  Family history unknown: Yes      Prior to Admission medications   Medication Sig Start Date End Date Taking? Authorizing Provider  albuterol (VENTOLIN HFA) 108 (90 Base) MCG/ACT inhaler Inhale 2 puffs into the lungs every 6 (six) hours as needed for wheezing or shortness of breath.   Yes [provider]  ALPRAZolam (XANAX) 0.5 MG tablet Take 1 tablet (0.5 mg total) by mouth 2 (two) times daily as needed for anxiety. 02/15/22  Yes Mozingo, [employer]  Nattalie, NP  aspirin 325 MG tablet Take 325 mg by mouth at bedtime.    Yes [provider]  bumetanide (BUMEX) 1 MG tablet TAKE 1 TABLET BY MOUTH TWICE A DAY Patient taking differently: Take 1 mg by mouth 2 (two) times daily. 07/17/21  Yes Nicholas Lose, MD  buPROPion (WELLBUTRIN XL) 150 MG 24 hr tablet Take 1 tablet (150 mg total) by mouth daily. 03/27/22  Yes Mozingo, Berdie Ogren, NP  dexlansoprazole (DEXILANT) 60 MG capsule Take 60 mg by mouth daily.   Yes [provider]  Diclofenac Potassium,Migraine, (CAMBIA) 50 MG PACK Take 1 packet by mouth as needed for migraine. 05/16/21  Yes [provider]  diphenhydrAMINE (BENADRYL) 50 MG/ML injection Inject 1 mL (50 mg total) into the vein every 6 (six) hours as needed (nausea). 12/25/18  Yes Rai, Ripudeep K, MD  dronabinol (MARINOL) 2.5 MG capsule TAKE 1 CAPSULE BY MOUTH TWICE DAILY AS NEEDED Patient taking differently: Take 2.5 mg by mouth 2 (two) times daily as needed (nausea). 12/26/21  Yes Nicholas Lose, MD  EPINEPHrine 0.3 mg/0.3 mL IJ SOAJ injection Inject 0.3 mg into the muscle as needed for anaphylaxis. 06/13/21  Yes Margaretha Seeds, MD  escitalopram (LEXAPRO) 20 MG tablet Take 1 tablet (20 mg total) by mouth every evening.  11/08/21  Yes Mozingo, Berdie Ogren, NP  fluticasone-salmeterol (ADVAIR HFA) 230-21 MCG/ACT inhaler Inhale 2 puffs into the lungs 2 (two) times daily. 02/02/22  Yes Margaretha Seeds, MD  Galcanezumab-gnlm 120 MG/ML SOAJ Inject 120 mg into the skin every 28 (twenty-eight) days.  03/31/19  Yes [provider]  hydrocortisone (CORTEF) 10 MG tablet Take 1-2 tablets (10-20 mg total) by mouth See admin instructions. Take 20 mg in the am and 24m in the evening Patient taking differently: Take 10-20 mg by mouth See admin instructions. Take 20 mg by mouth in the morning and 171min the evening 02/17/20  Yes Kyle, Tyrone A, DO  lamoTRIgine (LAMICTAL) 100 MG tablet Take one tablet at bedtime. 03/23/22  Yes Mozingo, ReBerdie OgrenNP  metoprolol succinate (TOPROL-XL) 25 MG 24 hr tablet Take 37.5 mg by mouth daily.    Yes [provider]  Rimegepant Sulfate (NURTEC) 75 MG TBDP Take 75 mg by mouth daily as needed (Migraine).    Yes [provider]  rosuvastatin (CRESTOR) 10 MG tablet Take 10 mg by mouth daily.  02/28/18  Yes [provider]  sitaGLIPtin (JANUVIA) 100 MG tablet Take 1 tablet (100 mg total) by mouth daily. 10/19/21  Yes GuNicholas LoseMD  sucralfate (CARAFATE) 1 g tablet Take 1 g by mouth daily as needed (FOR ULCERS).    Yes [provider]  sulfamethoxazole-trimethoprim (BACTRIM DS) 800-160 MG tablet Take 1 tablet by mouth 2 (two) times daily. 03/27/22  Yes Comer, RoOkey RegalMD  SYNTHROID 100 MCG tablet Take 1 tablet (100 mcg total) by mouth every morning. 10/19/21  Yes GuNicholas LoseMD  topiramate (TOPAMAX) 50 MG tablet Take 150 mg by mouth at bedtime.  12/25/19  Yes [provider]  zolpidem (AMBIEN CR) 12.5 MG CR tablet Take 12.5 mg by mouth at bedtime as needed. 03/05/22  Yes [provider]  albuterol (PROVENTIL) (2.5 MG/3ML) 0.083% nebulizer solution Take 3 mLs (2.5 mg total) by nebulization every 6 (six) hours as needed for wheezing or  shortness of breath. 03/27/21 06/16/21  GrMagdalen SpatzNP  benzonatate (TESSALON) 200 MG capsule Take 1 capsule (200 mg total) by mouth  3 (three) times daily as needed for cough. Patient not taking: Reported on 03/29/2022 04/15/19   Martyn Ehrich, NP  cyclobenzaprine (FLEXERIL) 5 MG tablet Take 1 tablet (5 mg total) by mouth 3 (three) times daily as needed for muscle spasms. Patient not taking: Reported on 03/29/2022 11/01/21   Blue, Soijett A, PA-C  fluconazole (DIFLUCAN) 150 MG tablet Take 1 tablet (150 mg total) by mouth daily. If needed for yeast infection Patient not taking: Reported on 03/02/2022 11/24/21   Carlyle Basques, MD  heparin lock flush 100 UNIT/ML SOLN injection USE 5 MLS AS A HEPLOCK IN PORT A CATH ONCE DAILY AFTER MEDICATION ADMINISTRATION OR AS DIRECTED BY PHYSICIAN. 10/30/21 10/30/22  Nicholas Lose, MD  Lancets (ONETOUCH DELICA PLUS [id number]) [place] 3 (three) times daily. for testing 06/12/21   [provider]  [facility] VERIO test strip 3 (three) times daily. for testing 06/12/21   [provider]  OXYGEN Inhale 3-4 L into the lungs continuous.    [provider]  potassium chloride (KLOR-CON) 10 MEQ tablet Take 1 tablet (10 mEq total) by mouth daily for 21 days. 07/15/20 08/05/20  Alfredia Client, PA-C  predniSONE (DELTASONE) 10 MG tablet Take 4 tabs x 2 days, then 3 x 2d, then 2 x 2d, then 1 x 2d and STOP Patient not taking: Reported on 03/29/2022 03/27/21   Magdalen Spatz, NP  Sodium Chloride Flush (NORMAL SALINE FLUSH) 0.9 % SOLN FLUSH WITH TWO 10 ML FLUSHES IN PORT BEFORE AND AFTER FLUIDS AND MEDICATION ADMINISTRATION, TO MAINTAIN PATENCY--USE UP TO 4 TIMES DAILY. 10/30/21 10/30/22  Nicholas Lose, MD    Physical Exam: BP 125/68   Pulse 93   Temp 98.2 F (36.8 C) (Oral)   Resp 18   Ht _0  (1.651 m)   Wt 80.3 kg   LMP  (LMP Unknown)   SpO2 100%   BMI 29.45 kg/m   General: 51 y.o. year-old female well developed well nourished in no acute distress.   Alert and oriented x3. Cardiovascular: Regular rate and rhythm with no rubs or gallops.  No thyromegaly or JVD noted.  No lower extremity edema. 2/4 pulses in all 4 extremities. Respiratory: Clear to auscultation with no wheezes or rales. Good inspiratory effort. Abdomen: Soft nontender nondistended with normal bowel sounds x4 quadrants. Muskuloskeletal: No cyanosis, clubbing or edema noted bilaterally Neuro: CN II-XII intact, strength, sensation, reflexes Skin: No ulcerative lesions noted or rashes Psychiatry: Judgement and insight appear normal. Mood is appropriate for condition and setting          Labs on Admission:  Basic Metabolic Panel: Recent Labs  Lab 03/28/22 0127  NA 141  K 3.5  CL 109  CO2 21*  GLUCOSE 147*  BUN 20  CREATININE 1.51*  CALCIUM 9.0   Liver Function Tests: Recent Labs  Lab 03/28/22 0127  AST 23  ALT 35  ALKPHOS 87  BILITOT 0.3  PROT 8.3*  ALBUMIN 4.1   No results for input(s): "LIPASE", "AMYLASE" in the last 168 hours. No results for input(s): "AMMONIA" in the last 168 hours. CBC: Recent Labs  Lab 03/28/22 0127  WBC 8.6  NEUTROABS 5.6  HGB 10.1*  HCT 33.0*  MCV 78.2*  PLT 348   Cardiac Enzymes: No results for input(s): "CKTOTAL", "CKMB", "CKMBINDEX", "TROPONINI" in the last 168 hours.  BNP (last 3 results) No results for input(s): "BNP" in the last 8760 hours.  ProBNP (last 3 results) No results for input(s): "PROBNP" in the last  8760 hours.  CBG: No results for input(s): "GLUCAP" in the last 168 hours.  Radiological Exams on Admission: DG Chest 2 View  Result Date: 03/28/2022 CLINICAL DATA:  Fever cancer patient EXAM: CHEST - 2 VIEW COMPARISON:  03/22/2022, 01/23/2021, CT chest 04/04/2020, CT abdomen pelvis 07/15/2020 FINDINGS: Right-sided central venous port tip at the cavoatrial region. No acute airspace disease or effusion. Stable cardiomediastinal silhouette with aortic atherosclerosis. No pneumothorax. Clips in the  bilateral axilla. IMPRESSION: No active cardiopulmonary disease. Electronically Signed   By: Donavan Foil M.D.   On: 03/28/2022 23:52    EKG: I independently viewed the EKG done and my findings are as followed: None available at the time of this visit.  Assessment/Plan Present on Admission:  Bacteremia  Principal Problem:   Bacteremia  Enterobacter aerogenes and gram-negative bacilli isolated bacteremia, POA The patient was started on meropenem as recommended by infectious disease. Repeat blood cultures x 2 peripherally in the morning The patient has a right-sided port in place Consult infectious disease in the morning Gentle IV fluid hydration  AKI, prerenal in the setting of dehydration Baseline creatinine appears to be 1.0 with GFR greater than 60 Presented with creatinine 1.51 with GFR 42 Avoid nephrotoxic agents, dehydration and hypotension Monitor urine output with strict I's and O's.  Repeat renal panel in the morning.  Addison's disease Cortef 20 mg daily and 10 mg nightly Maintain MAP greater than 65.  Hypothyroidism Continue home levothyroxine.  Hyperlipidemia Resume home Crestor  Chronic anxiety/depression Resume home regimen.   DVT prophylaxis: Subcu Lovenox daily  Code Status: Full code  Family Communication: None at bedside.  Disposition Plan: Admitted to progressive unit.  Consults called: None.  Please consult infectious disease in the morning.  Admission status: Inpatient status.   Status is: Inpatient The patient requires at least 2 midnights for further evaluation and treatment of present condition.   Kayleen Memos MD [facility] Pager [phone]  If 7PM-7AM, please contact night-coverage [email or web address] Password [facility]  03/29/2022, 5:38 AM

## 2022-03-29 NOTE — Progress Notes (Signed)
Patient seen and examined personally, I reviewed the chart, history and physical and admission note, done by admitting physician this morning and agree with the same with following addendum.  Please refer to the morning admission note for more detailed plan of care.  Briefly,  51 y.o.f w/ history significant for breast cancer status post bilateral mastectomy, not currently on chemotherapy, NHL,W/ Port-A-Cath, Addison's disease, who recently grew out gram-negative bacilli on blood culture drawn on 03/26/2022 as outpatient due to recurrent fevers and was started on Bactrim and had taken 2 doses of the antibiotics, and was having to persistent fevers with Tmax 101, and was asked by Dr. Juleen China to go to ED.  in ED-Tmax 98.2.  BP 125/68, pulse 94, respiratory 18, saturation 100% on room air.  Lab studies remarkable for Enterobacter aerogenes and gram-negative bacilli isolated bacteremia.  Serum bicarb 21, glucose 147, creatinine 1.51 with baseline creatinine 1.08.  GFR 42 with baseline creatinine of greater than 60.  Hemoglobin 10.1 with baseline hemoglobin 11.5.  MCV 78.  Platelet 348.  Started on meropenem and admitted.     Seen this am, no new complaints issues Bacteremia due to Enterobacter species 6/26 on outpatient test Fever: Continue meropenem, ID is consulted repeat blood culture.?  Source appears immunocompromised in the setting of cancer history, Addison's disease.  Sent message to Dr. Juleen China  History of NHL History of breast cancer S/P bilateral mastectomy Port-A-Cath in place/difficult stick  Migraine: cont prn meds Nausea/vomiting: Has had issues at home 2.  Resumed home IV benadryl, dexilant AKI: Baseline creatinine 1.0: Improving with hydration continue to hold Bumex for now Hypokalemia: Replete orally Hyperlipidemia: Continue statin Chronic respiratory failure with hypoxia -on home setting Moderate persistent asthma without complication:Continue supplemental oxygen, Dulera albuterol  nebulizer as needed Addison's disease: Continue hydrocortisone-if persistent vomiting will change to IV Generalized anxiety disorder: Continue her Wellbutrin and just psychotropic medication Normocytic anemia: Stable, monitor

## 2022-03-29 NOTE — ED Provider Notes (Signed)
[facility] DEPT Provider Note  CSN: [id number] Arrival date & time: 03/28/22 2253  Chief Complaint(s) Fever and Cancer  HPI [name] is a 51 y.o. female with a past medical history listed below including Addison's disease, breast cancer status post bilateral mastectomy not currently on chemo and non-Hodgkin's lymphoma requiring Port-A-Cath who recently grew out gram-negative bacilli on blood cultures drawn a few days ago.  Prior to that she was having fevers prompting the blood culture drawn.  She was called in Bactrim which she is taking a couple doses of.  She continues to have fevers today Tmax 101 prompting a call to infectious disease.  She spoke to Dr. Juleen China who recommended she present to the emergency department for admission and IV meropenem while she awaits for speciation.  The history is provided by the patient and medical records.    Past Medical History Past Medical History:  Diagnosis Date   Addison's disease ([place])    Adrenal insufficiency (HCC)    Anemia    Anxiety    Aortic stenosis    Aortic stenosis    Appendicitis 12/19/09   Appendicitis    Breast cancer (HCC)    STATUS POST BILATERAL MASTECTOMY. STATUS POST RECONSTRUCTION. SHE HAD SILICONE BREAST IMPLANTS AND THE LEFT IMPLANT IS LEAKING SLIGHTLY   Cellulitis of right middle finger 11/07/2018   Cervical cancer ([place]) 12/23/2018   Chest pain    CHF with right heart failure ([place]) 04/17/2017   Chronic respiratory failure with hypoxia (HCC) 12/23/2018   Cough variant asthma 04/13/2019   Depression    GERD (gastroesophageal reflux disease)    takes Dexilant and carafate and gi coctail    Headache    migraines on a daily and monthly regimen    Heart murmur    History of kidney stones    Hodgkin lymphoma ([place])    STATUS POST MANTLE RADIATION   Hodgkin's lymphoma ([place])    1987   Hypertension    Hypoxia    Necrotizing fasciitis ([place]) 12/23/2018   Non-ischemic cardiomyopathy ([place])     Osteoporosis    Palpitations    Pituitary adenoma ([place]) 12/23/2018   Pneumonia    PONV (postoperative nausea and vomiting)    Pre-diabetes    per pt; no meds   Pulmonary hypertension ([place]) 12/23/2018   Raynaud phenomenon    Right heart failure ([place]) 04/17/2017   Seizures ([place])    last febrile seizure was approx 3 weeks ago per report on 12/01/2020   Sleep apnea    upcoming sleep study per pt    Supplemental oxygen dependent    3 liters   SVT (supraventricular tachycardia) (HCC)    Tachycardia    Thyroid cancer ([place])    STATUS POST SURGICAL REMOVAL-CURRENT ON THYROID REPLACEMENT   Patient Active Problem List   Diagnosis Date Noted   Major depressive disorder, recurrent episode, moderate (HCC)    Generalized anxiety disorder    Seizure-like activity ([place]) 12/08/2020   High anion gap metabolic acidosis 12/13/9456   Hypokalemia 12/08/2020   AKI (acute kidney injury) ([place]) 04/27/2020   Vasculopathy 04/27/2020   Bacteremia 04/19/2020   Addison's disease ([place])    Nausea 04/01/2020   Generalized weakness 02/10/2020   Headache 02/10/2020   Subcutaneous nodule 02/01/2020   Port-A-Cath in place 09/09/2019   Moderate persistent asthma without complication 59/29/2446   Fever 07/21/2019   Superficial thrombophlebitis 07/16/2019   Injury of left index finger 12/24/2018   History of pituitary  adenoma 12/23/2018   History of cervical cancer 12/23/2018   Chronic respiratory failure with hypoxia (HCC) 12/23/2018   SOB (shortness of breath)    Nephrolithiasis 11/07/2018   Oxygen dependent 11/07/2018   Migraine 11/07/2018   PVC's (premature ventricular contractions) 10/27/2018   Hyperlipidemia 03/09/2014   Hx of Hodgkins lymphoma    History of ductal carcinoma in situ (DCIS) of breast    History of thyroid cancer    Adrenal insufficiency (HCC)    Hypertension    Raynaud phenomenon    GERD (gastroesophageal reflux disease)    Home Medication(s) Prior to Admission medications    Medication Sig Start Date End Date Taking? Authorizing Provider  albuterol (PROVENTIL) (2.5 MG/3ML) 0.083% nebulizer solution Take 3 mLs (2.5 mg total) by nebulization every 6 (six) hours as needed for wheezing or shortness of breath. 03/27/21 06/16/21  Magdalen Spatz, NP  albuterol (VENTOLIN HFA) 108 (90 Base) MCG/ACT inhaler Inhale 2 puffs into the lungs every 6 (six) hours as needed for wheezing or shortness of breath.    [provider]  ALPRAZolam Duanne Moron) 0.5 MG tablet Take 1 tablet (0.5 mg total) by mouth 2 (two) times daily as needed for anxiety. 02/15/22   Mozingo, Berdie Ogren, NP  alum & mag hydroxide-simeth (MAALOX/MYLANTA) 200-200-20 MG/5ML suspension Take 30 mLs by mouth daily as needed for indigestion or heartburn. For Bleeding Ulcers    [provider]  aspirin 325 MG tablet Take 325 mg by mouth at bedtime.     [provider]  benzonatate (TESSALON) 200 MG capsule Take 1 capsule (200 mg total) by mouth 3 (three) times daily as needed for cough. 04/15/19   Martyn Ehrich, NP  bumetanide (BUMEX) 1 MG tablet TAKE 1 TABLET BY MOUTH TWICE A DAY 07/17/21   Nicholas Lose, MD  buPROPion (WELLBUTRIN XL) 150 MG 24 hr tablet Take 1 tablet (150 mg total) by mouth daily. 03/27/22   Mozingo, Berdie Ogren, NP  cyclobenzaprine (FLEXERIL) 5 MG tablet Take 1 tablet (5 mg total) by mouth 3 (three) times daily as needed for muscle spasms. 11/01/21   Blue, Soijett A, PA-C  dexlansoprazole (DEXILANT) 60 MG capsule Take 60 mg by mouth daily.    [provider]  Diclofenac Potassium,Migraine, (CAMBIA) 50 MG PACK Take 1 packet by mouth as needed for migraine. 05/16/21   [provider]  diphenhydrAMINE (BENADRYL) 50 MG/ML injection Inject 1 mL (50 mg total) into the vein every 6 (six) hours as needed (nausea). 12/25/18   Rai, Ripudeep K, MD  dronabinol (MARINOL) 2.5 MG capsule TAKE 1 CAPSULE BY MOUTH TWICE DAILY AS NEEDED 12/26/21   Nicholas Lose, MD  EPINEPHrine  0.3 mg/0.3 mL IJ SOAJ injection Inject 0.3 mg into the muscle as needed for anaphylaxis. 06/13/21   Margaretha Seeds, MD  escitalopram (LEXAPRO) 20 MG tablet Take 1 tablet (20 mg total) by mouth every evening. 11/08/21   Mozingo, Berdie Ogren, NP  fluconazole (DIFLUCAN) 150 MG tablet Take 1 tablet (150 mg total) by mouth daily. If needed for yeast infection Patient not taking: Reported on 03/02/2022 11/24/21   Carlyle Basques, MD  fluticasone-salmeterol (ADVAIR HFA) [phone] MCG/ACT inhaler Inhale 2 puffs into the lungs 2 (two) times daily. 02/02/22   Margaretha Seeds, MD  Galcanezumab-gnlm 120 MG/ML SOAJ Inject 120 mg into the skin every 28 (twenty-eight) days.  03/31/19   [provider]  heparin lock flush 100 UNIT/ML SOLN injection USE 5 MLS AS A HEPLOCK IN PORT  A CATH ONCE DAILY AFTER MEDICATION ADMINISTRATION OR AS DIRECTED BY PHYSICIAN. 10/30/21 10/30/22  Nicholas Lose, MD  hydrocortisone (CORTEF) 10 MG tablet Take 1-2 tablets (10-20 mg total) by mouth See admin instructions. Take 20 mg in the am and 48m in the evening Patient taking differently: Take 10-20 mg by mouth See admin instructions. Take 20 mg by mouth in the morning and 19min the evening 02/17/20   KyCherylann Ratel, DO  lamoTRIgine (LAMICTAL) 100 MG tablet Take one tablet at bedtime. 03/23/22   Mozingo, ReBerdie OgrenNP  Lancets (OBethesda [facility] Surgery Center LLC D[facility] Surgery CenterELICA PLUS [id number] 3 (three) times daily. for testing 06/12/21   [provider]  levothyroxine (SYNTHROID, LEVOTHROID) 112 MCG tablet Take 112 mcg by mouth daily before breakfast.    [provider]  metoprolol succinate (TOPROL-XL) 25 MG 24 hr tablet Take 37.5 mg by mouth daily.     [provider]  [facility] Center[facility] test strip 3 (three) times daily. for testing 06/12/21   [provider]  OXYGEN Inhale 3-4 L into the lungs continuous.    [provider]  potassium chloride (KLOR-CON) 10 MEQ tablet Take 1 tablet (10 mEq total) by mouth daily for  21 days. 07/15/20 08/05/20  PaAlfredia ClientPA-C  potassium chloride (KLOR-CON) 10 MEQ tablet Take 10 mEq by mouth daily as needed (low potassium).    [provider]  predniSONE (DELTASONE) 10 MG tablet Take 4 tabs x 2 days, then 3 x 2d, then 2 x 2d, then 1 x 2d and STOP 03/27/21   GrMagdalen SpatzNP  Rimegepant Sulfate (NURTEC) 75 MG TBDP Take 75 mg by mouth daily as needed (Migraine).     [provider]  rosuvastatin (CRESTOR) 10 MG tablet Take 10 mg by mouth daily.  02/28/18   [provider]  silver sulfADIAZINE (SILVADENE) 1 % cream Apply 1 application topically daily as needed (Open Wound).  11/16/19   [provider]  sitaGLIPtin (JANUVIA) 100 MG tablet Take 1 tablet (100 mg total) by mouth daily. 10/19/21   GuNicholas LoseMD  Sodium Chloride Flush (NORMAL SALINE FLUSH) 0.9 % SOLN FLUSH WITH TWO 10 ML FLUSHES IN PORT BEFORE AND AFTER FLUIDS AND MEDICATION ADMINISTRATION, TO MAINTAIN PATENCY--USE UP TO 4 TIMES DAILY. 10/30/21 10/30/22  GuNicholas LoseMD  sucralfate (CARAFATE) 1 g tablet Take 1 g by mouth daily as needed (FOR ULCERS).     [provider]  sulfamethoxazole-trimethoprim (BACTRIM DS) 800-160 MG tablet Take 1 tablet by mouth 2 (two) times daily. Patient not taking: Reported on 03/02/2022 11/30/21   SnCarlyle BasquesMD  sulfamethoxazole-trimethoprim (BACTRIM DS) 800-160 MG tablet Take 1 tablet by mouth 2 (two) times daily. 03/02/22   Comer, RoOkey RegalMD  sulfamethoxazole-trimethoprim (BACTRIM DS) 800-160 MG tablet Take 1 tablet by mouth 2 (two) times daily. 03/27/22   Comer, RoOkey RegalMD  SYNTHROID 100 MCG tablet Take 1 tablet (100 mcg total) by mouth every morning. 10/19/21   GuNicholas LoseMD  topiramate (TOPAMAX) 50 MG tablet Take 150 mg by mouth at bedtime.  12/25/19   [provider]  zolpidem (AMBIEN CR) 12.5 MG CR tablet Take 12.5 mg by mouth at bedtime as needed. 03/05/22   [provider]  Allergies Ferrous bisglycinate chelate [iron], Mushroom extract complex, Na ferric gluc cplx in sucrose, Cymbalta [duloxetine hcl], Phenergan [promethazine], Promethazine hcl, Succinylcholine, Buprenorphine hcl, Compazine, Metoclopramide, Morphine and related, Ondansetron, Promethazine hcl, and Tegaderm ag mesh [silver]  Review of Systems Review of Systems As noted in HPI  Physical Exam Vital Signs  I have reviewed the triage vital signs BP 125/68   Pulse 93   Temp 98.2 F (36.8 C) (Oral)   Resp 18   Ht _0  (1.651 m)   Wt 80.3 kg   LMP  (LMP Unknown)   SpO2 100%   BMI 29.45 kg/m   Physical Exam Vitals reviewed.  Constitutional:      General: She is not in acute distress.    Appearance: She is well-developed. She is not diaphoretic.  HENT:     Head: Normocephalic and atraumatic.     Right Ear: External ear normal.     Left Ear: External ear normal.     Nose: Nose normal.  Eyes:     General: No scleral icterus.    Conjunctiva/sclera: Conjunctivae normal.  Neck:     Trachea: Phonation normal.  Cardiovascular:     Rate and Rhythm: Normal rate and regular rhythm.  Pulmonary:     Effort: Pulmonary effort is normal. No respiratory distress.     Breath sounds: No stridor.  Abdominal:     General: There is no distension.  Musculoskeletal:        General: Normal range of motion.     Cervical back: Normal range of motion.  Neurological:     Mental Status: She is alert and oriented to person, place, and time.  Psychiatric:        Behavior: Behavior normal.     ED Results and Treatments Labs (all labs ordered are listed, but only abnormal results are displayed) Labs Reviewed  CBC WITH DIFFERENTIAL/PLATELET - Abnormal; Notable for the following components:      Result Value   Hemoglobin 10.1 (*)    HCT 33.0 (*)    MCV 78.2 (*)    MCH 23.9 (*)    RDW 17.1 (*)    All other  components within normal limits  COMPREHENSIVE METABOLIC PANEL - Abnormal; Notable for the following components:   CO2 21 (*)    Glucose, Bld 147 (*)    Creatinine, Ser 1.51 (*)    Total Protein 8.3 (*)    GFR, Estimated 42 (*)    All other components within normal limits  URINALYSIS, ROUTINE W REFLEX MICROSCOPIC - Abnormal; Notable for the following components:   Color, Urine STRAW (*)    All other components within normal limits                                                                                                                         EKG  EKG Interpretation  Date/Time:    Ventricular Rate:    PR Interval:    QRS Duration:  QT Interval:    QTC Calculation:   R Axis:     Text Interpretation:         Radiology DG Chest 2 View  Result Date: 03/28/2022 CLINICAL DATA:  Fever cancer patient EXAM: CHEST - 2 VIEW COMPARISON:  03/22/2022, 01/23/2021, CT chest 04/04/2020, CT abdomen pelvis 07/15/2020 FINDINGS: Right-sided central venous port tip at the cavoatrial region. No acute airspace disease or effusion. Stable cardiomediastinal silhouette with aortic atherosclerosis. No pneumothorax. Clips in the bilateral axilla. IMPRESSION: No active cardiopulmonary disease. Electronically Signed   By: Donavan Foil M.D.   On: 03/28/2022 23:52    Pertinent labs & imaging results that were available during my care of the patient were reviewed by me and considered in my medical decision making (see MDM for details).  Medications Ordered in ED Medications  LORazepam (ATIVAN) injection 1 mg (has no administration in time range)  sodium chloride 0.9 % bolus 1,000 mL (has no administration in time range)  meropenem (MERREM) 1 g in sodium chloride 0.9 % 100 mL IVPB (has no administration in time range)                                                                                                                                     Procedures Procedures  (including critical care  time)  Medical Decision Making / ED Course    Complexity of Problem:  Co-morbidities/SDOH that complicate the patient evaluation/care: Noted in HPI  Additional history obtained: Telephone conversation with Dr. Juleen China  Patient's presenting problem/concern, DDX, and MDM listed below: Bacteremia Grew out gram-negative bacilli which still has not speciated. We will get basic labs.  Hospitalization Considered:  Yes    Complexity of Data:   Laboratory Tests ordered listed below with my independent interpretation: CBC without leukocytosis Metabolic panel with mild AKI.  No significant electrolyte derangements.   Imaging Studies ordered listed below with my independent interpretation: Chest x-ray negative for pneumonia     ED Course:    Assessment, Add'l Intervention, and Reassessment: Bacteremia Started on meropenem Discussed case with Dr. [place] Crane from hospitalist service who agreed to admit patient    Final Clinical Impression(s) / ED Diagnoses Final diagnoses:  Bacteremia           This chart was dictated using voice recognition software.  Despite best efforts to proofread,  errors can occur which can change the documentation meaning.    Fatima Blank, MD 03/29/22 [id number]

## 2022-03-29 NOTE — Consult Note (Signed)
[facility]    Date of Admission:  03/28/2022     Reason for Consult: Central line infection     Referring Physician: Dr Lupita Leash  Current antibiotics: Meropenem   ASSESSMENT:    51 y.o. female admitted with:  # Enterobacter aerogenes bacteremia - secondary to right sided Port - positive blood cultures from 6/26 as an outpatient - continued fevers and feeling poorly on oral Bactrim as an outpatient - presented to [facility] ED last night and has been afebrile since that time - anxious to get home as soon as possible in order to travel back to [place]:    Discussed at length with patient options for antibiotics and line strategy given her current bacteremia One option of removing her port and treating with oral antibiotics followed by placing a new port in the future was discussed.  However, this raises concern that future access would not be able to be re-established and this proposition was fairly anxiety provoking for her especially as she gets ready to travel to [place] for vacation Another option of doing an exchange over a wire was proposed and she seemed more amenable to this option if it is possible Will ask IR to evaluate for possible line exchange in this setting Anticipate she should be able to go home on oral antibiotics with either Levaquin or higher dose Bactrim at discharge.    Principal Problem:   Bacteremia due to Enterobacter species Active Problems:   Hx of Hodgkins lymphoma   Hyperlipidemia   Chronic respiratory failure with hypoxia (HCC)   Fever   Moderate persistent asthma without complication   Port-A-Cath in place   Addison's disease (HCC)   AKI (acute kidney injury) ([place])   Hypokalemia   Generalized anxiety disorder   Normocytic anemia   MEDICATIONS:    Scheduled Meds:  aspirin  325 mg Oral QHS   buPROPion  150 mg Oral Daily   Chlorhexidine Gluconate Cloth  6 each Topical Daily   dexlansoprazole  60 mg Oral  Daily   enoxaparin (LOVENOX) injection  40 mg Subcutaneous Q24H   escitalopram  20 mg Oral QPM   hydrocortisone  20 mg Oral Daily   And   hydrocortisone  10 mg Oral QHS   lamoTRIgine  100 mg Oral QHS   levothyroxine  100 mcg Oral q morning   mepolizumab  100 mg Subcutaneous Q28 days   metoprolol succinate  37.5 mg Oral Daily   mometasone-formoterol  2 puff Inhalation BID   rosuvastatin  10 mg Oral Daily   sodium chloride flush  10-40 mL Intracatheter Q12H   topiramate  150 mg Oral QHS   Continuous Infusions:  sodium chloride     ceFEPime (MAXIPIME) IV     lactated ringers 50 mL/hr at 03/29/22 0710   PRN Meds:.sodium chloride, albuterol, ALPRAZolam, diphenhydrAMINE, dronabinol, sodium chloride flush, sucralfate, zolpidem  HPI:    [name] is a 51 y.o. female with complicated PMHx noted below.  She has a Port in place and has a history of prior line infections/bacteremia.  Her current line was placed in March 2023 at which point she had a right IJ port placed and left port removed due to infection.  Her prior Port on the left had been in place since August 2021.  She had issues with her Port earlier in June and was seen by Dr Linus Salmons given her concern for infection.  Treated at that time with  Bactrim 1 DS BID x 5 days with what appeared to be resolution.  Then last week on 6/22 she was evaluated by IR and complained of pain at right upper chest port site.  This started after having her Port accessed the day prior at another facility.  She also reported a little bit of yellow-green fluid on dressing.  On exam that date there was no erythema or discharge to port site.  CXR showed that the [facility] was appropriately placed and she was advised to contact with any worsening pain or signs of infection.  Subsequently, she developed high fevers and presented to [facility] on 6/26 where peripheral blood cultures were able to be obtained and both are positive for Enterobacter aerogenes.  She was started  on Bactrim 1 DS BID but after 3 doses still was febrile and noted to be feeling poorly.  She thus presented to [facility] ED overnight and was started on Meropenem.      Past Medical History:  Diagnosis Date   Addison's disease ([place])    Adrenal insufficiency ([place])    Anemia    Anxiety    Aortic stenosis    Aortic stenosis    Appendicitis 12/19/09   Appendicitis    Breast cancer (HCC)    STATUS POST BILATERAL MASTECTOMY. STATUS POST RECONSTRUCTION. SHE HAD SILICONE BREAST IMPLANTS AND THE LEFT IMPLANT IS LEAKING SLIGHTLY   Cellulitis of right middle finger 11/07/2018   Cervical cancer ([place]) 12/23/2018   Chest pain    CHF with right heart failure ([place]) 04/17/2017   Chronic respiratory failure with hypoxia (HCC) 12/23/2018   Cough variant asthma 04/13/2019   Depression    GERD (gastroesophageal reflux disease)    takes Dexilant and carafate and gi coctail    Headache    migraines on a daily and monthly regimen    Heart murmur    History of kidney stones    Hodgkin lymphoma ([place])    STATUS POST MANTLE RADIATION   Hodgkin's lymphoma ([place])    1987   Hypertension    Hypoxia    Necrotizing fasciitis ([place]) 12/23/2018   Non-ischemic cardiomyopathy ([place])    Osteoporosis    Palpitations    Pituitary adenoma ([place]) 12/23/2018   Pneumonia    PONV (postoperative nausea and vomiting)    Pre-diabetes    per pt; no meds   Pulmonary hypertension ([place]) 12/23/2018   Raynaud phenomenon    Right heart failure ([place]) 04/17/2017   Seizures ([place])    last febrile seizure was approx 3 weeks ago per report on 12/01/2020   Sleep apnea    upcoming sleep study per pt    Supplemental oxygen dependent    3 liters   SVT (supraventricular tachycardia) (HCC)    Tachycardia    Thyroid cancer ([place])    STATUS POST SURGICAL REMOVAL-CURRENT ON THYROID REPLACEMENT    Social History   Tobacco Use   Smoking status: Never   Smokeless tobacco: Never  Vaping Use   Vaping Use: Never used  Substance Use Topics   Alcohol  use: Not Currently    Comment: social    Drug use: No    Family History  Family history unknown: Yes    Allergies  Allergen Reactions   Ferrous Bisglycinate Chelate [Iron] Anaphylaxis    Only IV    Mushroom Extract Complex Anaphylaxis   Na Ferric Gluc Cplx In Sucrose Anaphylaxis   Cymbalta [Duloxetine Hcl] Swelling and Anxiety   Phenergan [Promethazine]  Other reaction(s): Unknown   Promethazine Hcl     Other reaction(s): Unknown   Succinylcholine Other (See Comments)    Lock Jaw   Buprenorphine Hcl Hives   Compazine Other (See Comments)    Altered mental status Aggression   Metoclopramide Other (See Comments)    Dystonia   Morphine And Related Hives   Ondansetron Hives and Rash    Other reaction(s): Unknown Other reaction(s): Unknown   Promethazine Hcl Hives   Tegaderm Ag Mesh [Silver] Rash    Old formulation only, is able to tolerate new formulation    Review of Systems  Constitutional:  Positive for fever and malaise/fatigue.  Respiratory: Negative.    Cardiovascular: Negative.   Skin:        Improved tenderness at site of her Port from last week  All other systems reviewed and are negative.   OBJECTIVE:   Blood pressure 103/61, pulse 84, temperature 98.2 F (36.8 C), resp. rate 20, height _0  (1.651 m), weight 80.3 kg, SpO2 98 %. Body mass index is 29.45 kg/m.  Physical Exam Constitutional:      General: She is not in acute distress.    Appearance: Normal appearance.  HENT:     Head: Normocephalic and atraumatic.  Eyes:     Extraocular Movements: Extraocular movements intact.     Conjunctiva/sclera: Conjunctivae normal.  Pulmonary:     Effort: Pulmonary effort is normal. No respiratory distress.  Abdominal:     General: There is no distension.     Palpations: Abdomen is soft.  Musculoskeletal:     Cervical back: Normal range of motion and neck supple.     Comments: Right sided Port in place.  No erythema surrounding this area.  She was  able to palpate around her Port site without pain.   Skin:    General: Skin is warm and dry.     Findings: No rash.  Neurological:     General: No focal deficit present.     Mental Status: She is alert and oriented to person, place, and time.  Psychiatric:        Mood and Affect: Mood normal.        Behavior: Behavior normal.      Lab Results: Lab Results  Component Value Date   WBC 8.4 03/29/2022   HGB 9.3 (L) 03/29/2022   HCT 29.8 (L) 03/29/2022   MCV 79.3 (L) 03/29/2022   PLT 311 03/29/2022    Lab Results  Component Value Date   NA 142 03/29/2022   K 3.4 (L) 03/29/2022   CO2 21 (L) 03/29/2022   GLUCOSE 90 03/29/2022   BUN 17 03/29/2022   CREATININE 1.35 (H) 03/29/2022   CALCIUM 8.3 (L) 03/29/2022   GFRNONAA 48 (L) 03/29/2022   GFRAA >60 04/27/2020    Lab Results  Component Value Date   ALT 32 03/29/2022   AST 21 03/29/2022   ALKPHOS 83 03/29/2022   BILITOT 0.3 03/29/2022       Component Value Date/Time   CRP 1.3 (H) 11/13/2018 0005       Component Value Date/Time   ESRSEDRATE 90 (H) 11/17/2018 0415    I have reviewed the micro and lab results in Epic.  Imaging: DG Chest 2 View  Result Date: 03/28/2022 CLINICAL DATA:  Fever cancer patient EXAM: CHEST - 2 VIEW COMPARISON:  03/22/2022, 01/23/2021, CT chest 04/04/2020, CT abdomen pelvis 07/15/2020 FINDINGS: Right-sided central venous port tip at the cavoatrial region. No acute airspace disease  or effusion. Stable cardiomediastinal silhouette with aortic atherosclerosis. No pneumothorax. Clips in the bilateral axilla. IMPRESSION: No active cardiopulmonary disease. Electronically Signed   By: Donavan Foil M.D.   On: 03/28/2022 23:52     Imaging independently reviewed in Epic.  [facility] for Infectious Disease [facility] [phone] pager 03/29/2022, 12:47 PM  I have personally spent 80 minutes involved in face-to-face and non-face-to-face activities for this  patient on the day of the visit. Professional time spent includes the following activities: Preparing to see the patient (review of tests), Obtaining and/or reviewing separately obtained history (admission/discharge record), Performing a medically appropriate examination and/or evaluation , Ordering medications/tests/procedures, referring and communicating with other health care professionals, Documenting clinical information in the EMR, Independently interpreting results (not separately reported), Communicating results to the patient/family/caregiver, Counseling and educating the patient/family/caregiver and Care coordination (not separately reported).

## 2022-03-29 NOTE — Progress Notes (Signed)
Pharmacy Antibiotic Note  [name] is a 51 y.o. female admitted on 03/28/2022 with bacteremia.  Pharmacy has been consulted for meropenem dosing. 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    Allergies  Allergen Reactions   Ferrous Bisglycinate Chelate [Iron] Anaphylaxis    Only IV    Mushroom Extract Complex Anaphylaxis   Na Ferric Gluc Cplx In Sucrose Anaphylaxis   Cymbalta [Duloxetine Hcl] Swelling and Anxiety   Phenergan [Promethazine]     Other reaction(s): Unknown   Promethazine Hcl     Other reaction(s): Unknown   Succinylcholine Other (See Comments)    Lock Jaw   Buprenorphine Hcl Hives   Compazine Other (See Comments)    Altered mental status Aggression   Metoclopramide Other (See Comments)    Dystonia   Morphine And Related Hives   Ondansetron Hives and Rash    Other reaction(s): Unknown Other reaction(s): Unknown   Promethazine Hcl Hives   Tegaderm Ag Mesh [Silver] Rash    Old formulation only, is able to tolerate new formulation    Antimicrobials this admission: 6/29 Meropenem>>  Dose adjustments this admission:  Microbiology results: 6/26 BCx2: GNR  Thank you for allowing pharmacy to be a part of this patient's care.  Eudelia Bunch, Pharm.D 03/29/2022 2:17 AM

## 2022-03-30 ENCOUNTER — Encounter (HOSPITAL_COMMUNITY): Payer: Self-pay | Admitting: Internal Medicine

## 2022-03-30 ENCOUNTER — Inpatient Hospital Stay (HOSPITAL_COMMUNITY): Payer: BC Managed Care – PPO

## 2022-03-30 DIAGNOSIS — B9689 Other specified bacterial agents as the cause of diseases classified elsewhere: Secondary | ICD-10-CM | POA: Diagnosis not present

## 2022-03-30 DIAGNOSIS — R7881 Bacteremia: Secondary | ICD-10-CM | POA: Diagnosis not present

## 2022-03-30 HISTORY — PX: IR REMOVAL TUN ACCESS W/ PORT W/O FL MOD SED: IMG2290

## 2022-03-30 LAB — CBC
HCT: 28.8 % — ABNORMAL LOW (ref 36.0–46.0)
Hemoglobin: 8.6 g/dL — ABNORMAL LOW (ref 12.0–15.0)
MCH: 23.9 pg — ABNORMAL LOW (ref 26.0–34.0)
MCHC: 29.9 g/dL — ABNORMAL LOW (ref 30.0–36.0)
MCV: 80 fL (ref 80.0–100.0)
Platelets: 284 10*3/uL (ref 150–400)
RBC: 3.6 MIL/uL — ABNORMAL LOW (ref 3.87–5.11)
RDW: 17.1 % — ABNORMAL HIGH (ref 11.5–15.5)
WBC: 7 10*3/uL (ref 4.0–10.5)
nRBC: 0 % (ref 0.0–0.2)

## 2022-03-30 LAB — HIV ANTIBODY (ROUTINE TESTING W REFLEX): HIV Screen 4th Generation wRfx: NONREACTIVE

## 2022-03-30 LAB — BASIC METABOLIC PANEL
Anion gap: 6 (ref 5–15)
BUN: 15 mg/dL (ref 6–20)
CO2: 23 mmol/L (ref 22–32)
Calcium: 8.9 mg/dL (ref 8.9–10.3)
Chloride: 113 mmol/L — ABNORMAL HIGH (ref 98–111)
Creatinine, Ser: 1.01 mg/dL — ABNORMAL HIGH (ref 0.44–1.00)
GFR, Estimated: >60 mL/min (ref >60)
Glucose, Bld: 128 mg/dL — ABNORMAL HIGH (ref 70–99)
Potassium: 3.9 mmol/L (ref 3.5–5.1)
Sodium: 142 mmol/L (ref 135–145)

## 2022-03-30 MED ORDER — LIDOCAINE HCL 1 % IJ SOLN
INTRAMUSCULAR | Status: AC | PRN
  Administered 2022-03-30: 5 mL via INTRADERMAL
  Administered 2022-03-30: 10 mL via INTRADERMAL

## 2022-03-30 MED ORDER — LIDOCAINE HCL 1 % IJ SOLN
INTRAMUSCULAR | Status: AC
  Filled 2022-03-30: qty 20

## 2022-03-30 MED ORDER — FENTANYL CITRATE (PF) 100 MCG/2ML IJ SOLN
INTRAMUSCULAR | Status: AC | PRN
  Administered 2022-03-30: 50 ug via INTRAVENOUS

## 2022-03-30 MED ORDER — HYDROCORTISONE SOD SUC (PF) 100 MG IJ SOLR
100.0000 mg | Freq: Once | INTRAMUSCULAR | Status: AC
  Administered 2022-03-30: 100 mg via INTRAVENOUS
  Filled 2022-03-30: qty 2

## 2022-03-30 MED ORDER — SODIUM CHLORIDE 0.9 % IV SOLN
2.0000 g | Freq: Three times a day (TID) | INTRAVENOUS | Status: DC
  Administered 2022-03-30 – 2022-03-31 (×2): 2 g via INTRAVENOUS
  Filled 2022-03-30 (×2): qty 12.5

## 2022-03-30 MED ORDER — IBUPROFEN 200 MG PO TABS
400.0000 mg | ORAL_TABLET | Freq: Four times a day (QID) | ORAL | Status: DC | PRN
  Administered 2022-03-30 – 2022-03-31 (×2): 400 mg via ORAL
  Filled 2022-03-30 (×2): qty 2

## 2022-03-30 MED ORDER — MIDAZOLAM HCL 2 MG/2ML IJ SOLN
INTRAMUSCULAR | Status: AC | PRN
  Administered 2022-03-30: 1 mg via INTRAVENOUS

## 2022-03-30 MED ORDER — MIDAZOLAM HCL 2 MG/2ML IJ SOLN
INTRAMUSCULAR | Status: AC
  Filled 2022-03-30: qty 4

## 2022-03-30 MED ORDER — FENTANYL CITRATE (PF) 100 MCG/2ML IJ SOLN
INTRAMUSCULAR | Status: AC
  Filled 2022-03-30: qty 4

## 2022-03-30 MED ORDER — HEPARIN SOD (PORK) LOCK FLUSH 100 UNIT/ML IV SOLN
INTRAVENOUS | Status: AC
  Filled 2022-03-30: qty 5

## 2022-03-30 MED ORDER — ORAL CARE MOUTH RINSE: 15.0000 mL | OROMUCOSAL | Status: DC | PRN

## 2022-03-30 NOTE — Procedures (Signed)
Interventional Radiology Procedure Note  Procedure: Right chest port removal; left arm PICC line placement  Complications: None  Estimated Blood Loss: < 10 mL  Findings: 43 cm SL Power PICC via left brachial vein. Tip at SVC/RA junction. OK to use.  Right chest port removed with sharp and blunt dissection. Wound closed.  Venetia Night. Kathlene Cote, M.D Pager:  [phone]

## 2022-03-30 NOTE — Progress Notes (Signed)
PROGRESS NOTE.  [name]  OBS:[id number] DOB: Dec 11, 1970 DOA: 03/28/2022 PCP: Nicholas Lose, MD   Brief Narrative/Hospital Course: 51 y.o.f w/ history significant for breast cancer status post bilateral mastectomy, not currently on chemotherapy, NHL,W/ Port-A-Cath, Addison's disease, who recently grew out gram-negative bacilli on blood culture drawn on 03/26/2022 as outpatient due to recurrent fevers and was started on Bactrim and had taken 2 doses of the antibiotics, and was having to persistent fevers with Tmax 101, and was asked by Dr. Juleen China to go to ED.  in ED-Tmax 98.2.  BP 125/68, pulse 94, respiratory 18, saturation 100% on room air.  Lab studies remarkable for Enterobacter aerogenes and gram-negative bacilli isolated bacteremia.  Serum bicarb 21, glucose 147, creatinine 1.51 with baseline creatinine 1.08.  GFR 42 with baseline creatinine of greater than 60.  Hemoglobin 10.1 with baseline hemoglobin 11.5.  MCV 78.  Platelet 348.  Started on meropenem and admitted. Has remained afebrile since admission    Subjective: Seen and examined this morning. Overnight afebrile WBC count remains stable hemoglobin 8.6 g Creatinine further improved to 1.0, on admit 1.5 Having 2 solates Enterobacter aerogenes  and gram-negative bacilli in the blood culture Asking about s pharmacy number Lower tress dose of steroids prior to procedure by IR today  Assessment and Plan: Principal Problem:   Bacteremia due to Enterobacter species Active Problems:   Hx of Hodgkins lymphoma   Hyperlipidemia   Chronic respiratory failure with hypoxia (HCC)   Fever   Moderate persistent asthma without complication   Port-A-Cath in place   Addison's disease ([place])   AKI (acute kidney injury) ([place])   Hypokalemia   Generalized anxiety disorder   Normocytic anemia   Bacteremia due to Enterobacter species 6/26 on outpatient test Fever: Having 2 solates Enterobacter aerogenes  and gram-negative bacilli in the  blood culture.bacteremia secondary to right sided port.  Patient feeling poorly on Bactrim as an outpatient and with fever, admitted for IV antibiotics, continue meropenem as per ID - ? Port removal- but has poor access-IR to evaluate for ? line exchange/line holiday-access issues as per discussion. By ID w/ patient   History of NHL History of breast cancer S/P bilateral mastectomy: Follow-up with the provider Port-A-Cath in place/difficult stick-reports is very difficult to stick and needs IV Benadryl round-the-clock at home and has port for that reason.   Migraine: cont prn meds Nausea/vomiting: Has had issues at home 2.  cont home IV benadryl, dexilant Chronic pain continue with low-dose Dilaudid per request AKI: Baseline creatinine 1.0: resolved on ivf. continue to hold Bumex for now Hypokalemia: resolved Hyperlipidemia: Continue statin Chronic respiratory failure with hypoxia -on home setting Moderate persistent asthma without complication:Continue supplemental oxygen, Dulera albuterol nebulizer as needed Addison's disease: Continue po hydrocortisone-if persistent vomiting will change to IV.  Overall doing well and hemodynamically stable-requesting hydrocortisone IV 100 MG x1 prior to procedure- will get pharmacy to order it. Generalized anxiety disorder: Continue her Wellbutrin and just psychotropic medication Normocytic anemia: Stable, monitor GOC: Skin is requesting DNR we will change  DVT prophylaxis: enoxaparin (LOVENOX) injection 40 mg Start: 03/29/22 1000 Code Status:   Code Status: Full Code Family Communication: plan of care discussed with patient at bedside. Patient status is: Inpatient because of bacteremia and need for IV antibiotics Level of care: Progressive   Dispo: The patient is from: home            Anticipated disposition: home in >2 days  Mobility Assessment (last 72 hours)  Mobility Assessment     Row Name 03/29/22 2145 03/29/22 1500         Does  patient have an order for bedrest or is patient medically unstable No - Continue assessment No - Continue assessment      What is the highest level of mobility based on the progressive mobility assessment? Level 6 (Walks independently in room and hall) - Balance while walking in room without assist - Complete Level 6 (Walks independently in room and hall) - Balance while walking in room without assist - Complete                Objective: Vitals last 24 hrs: Vitals:   03/29/22 2230 03/30/22 0217 03/30/22 0700 03/30/22 0800  BP: 123/76 123/65    Pulse: 86 82    Resp:  _0 Temp: 98.6 F (37 C) 98.2 F (36.8 C)    TempSrc: Oral Oral    SpO2: 99% 100%    Weight:      Height:       Weight change:   Physical Examination: General exam: alert awake,older than stated age, weak appearing. HEENT:Oral mucosa moist, Ear/Nose WNL grossly, dentition normal. Respiratory system: bilaterally clear, port+ in right upper chest Cardiovascular system: S1 & S2 +, No JVD. Gastrointestinal system: Abdomen soft,NT,ND, BS+ Nervous System:Alert, awake, moving extremities and grossly nonfocal Extremities: LE edema NEG,distal peripheral pulses palpable.  Skin: No rashes,no icterus. MSK: Normal muscle bulk,tone, power  Medications reviewed: Scheduled Meds:  aspirin  325 mg Oral QHS   buPROPion  150 mg Oral Daily   Chlorhexidine Gluconate Cloth  6 each Topical Daily   dexlansoprazole  60 mg Oral Daily   enoxaparin (LOVENOX) injection  40 mg Subcutaneous Q24H   escitalopram  20 mg Oral QPM   fluconazole  100 mg Oral Daily   hydrocortisone  20 mg Oral Daily   And   hydrocortisone  10 mg Oral QHS   lamoTRIgine  100 mg Oral QHS   levothyroxine  100 mcg Oral q morning   metoprolol succinate  37.5 mg Oral Daily   mometasone-formoterol  2 puff Inhalation BID   rosuvastatin  10 mg Oral Daily   sodium chloride flush  10-40 mL Intracatheter Q12H   topiramate  150 mg Oral QHS   Continuous  Infusions:  ceFEPime (MAXIPIME) IV Stopped (03/30/22 0347)   lactated ringers 50 mL/hr at 03/29/22 1922      Diet Order             Diet regular Room service appropriate? Yes; Fluid consistency: Thin  Diet effective now                            Intake/Output Summary (Last 24 hours) at 03/30/2022 0826 Last data filed at 03/30/2022 0600 Gross per 24 hour  Intake 2015.27 ml  Output --  Net 2015.27 ml   Net IO Since Admission: 3,115.27 mL [03/30/22 0826]  Wt Readings from Last 3 Encounters:  03/28/22 80.3 kg  03/02/22 82.1 kg  02/02/22 84 kg     Unresulted Labs (From admission, onward)     Start     Ordered   03/30/22 0500  HIV Antibody (routine testing w rflx)  (HIV Antibody (Routine testing w reflex) panel)  Tomorrow morning,   R        03/29/22 0559   03/30/22 8850  Basic metabolic panel  Daily,   R  03/29/22 0802   03/30/22 0500  CBC  Daily,   R      03/29/22 0802          Data Reviewed: I have personally reviewed following labs and imaging studies CBC: Recent Labs  Lab 03/28/22 0127 03/29/22 0608 03/30/22 0500  WBC 8.6 8.4 7.0  NEUTROABS 5.6 5.0  --   HGB 10.1* 9.3* 8.6*  HCT 33.0* 29.8* 28.8*  MCV 78.2* 79.3* 80.0  PLT 348 311 789   Basic Metabolic Panel: Recent Labs  Lab 03/28/22 0127 03/29/22 0608 03/30/22 0500  NA 141 142 142  K 3.5 3.4* 3.9  CL 109 112* 113*  CO2 21* 21* 23  GLUCOSE 147* 90 128*  BUN _0 CREATININE 1.51* 1.35* 1.01*  CALCIUM 9.0 8.3* 8.9  MG  --  2.2  --   PHOS  --  4.0  --    GFR: Estimated Creatinine Clearance: 69 mL/min (A) (by C-G formula based on SCr of 1.01 mg/dL (H)). Liver Function Tests: Recent Labs  Lab 03/28/22 0127 03/29/22 0608  AST 23 21  ALT 35 32  ALKPHOS 87 83  BILITOT 0.3 0.3  PROT 8.3* 7.1  ALBUMIN 4.1 3.4*   No results for input(s): "LIPASE", "AMYLASE" in the last 168 hours. No results for input(s): "AMMONIA" in the last 168 hours. Coagulation Profile: No results for  input(s): "INR", "PROTIME" in the last 168 hours. BNP (last 3 results) No results for input(s): "PROBNP" in the last 8760 hours. HbA1C: No results for input(s): "HGBA1C" in the last 72 hours. CBG: No results for input(s): "GLUCAP" in the last 168 hours. Lipid Profile: No results for input(s): "CHOL", "HDL", "LDLCALC", "TRIG", "CHOLHDL", "LDLDIRECT" in the last 72 hours. Thyroid Function Tests: No results for input(s): "TSH", "T4TOTAL", "FREET4", "T3FREE", "THYROIDAB" in the last 72 hours. Sepsis Labs: No results for input(s): "PROCALCITON", "LATICACIDVEN" in the last 168 hours.  Recent Results (from the past 240 hour(s))  Blood culture (routine single)     Status: Abnormal (Preliminary result)   Collection Time: 03/26/22  2:14 AM   Specimen: Blood  Result Value Ref Range Status   MICRO NUMBER: [id number]  Preliminary   SPECIMEN QUALITY: Adequate  Preliminary   Source BLOOD 1  Preliminary   STATUS: PRELIMINARY  Preliminary   Result: Pos 48.6  Preliminary   ISOLATE 1: Enterobacter aerogenes (AA)  Preliminary    Comment: Klebsiella aerogenes (Enterobacter)   ISOLATE 2: Gram negative bacilli isolated (AA)  Preliminary    Comment: Gram negative bacilli isolated from aerobic bottle only   COMMENT: Aerobic and anaerobic bottle received.  Preliminary      Susceptibility   Enterobacter aerogenes - BLOOD CULTURE, NEGATIVE 1    AMOX/CLAVULANIC >=32 Resistant     CEFAZOLIN* >=64 Resistant      * For infections other than uncomplicated UTI caused by E. coli, K. pneumoniae or P. mirabilis: Cefazolin is resistant if MIC > or = 8 mcg/mL. (Distinguishing susceptible versus intermediate for isolates with MIC < or = 4 mcg/mL requires additional testing.)     CEFTAZIDIME <=1 Sensitive     CEFEPIME <=1 Sensitive     CEFTRIAXONE <=1 Sensitive     CIPROFLOXACIN <=0.25 Sensitive     LEVOFLOXACIN <=0.12 Sensitive     GENTAMICIN <=1 Sensitive     IMIPENEM 1 Sensitive     PIP/TAZO <=4 Sensitive      TOBRAMYCIN <=1 Sensitive     TRIMETH/SULFA* <=20 Sensitive      *  For infections other than uncomplicated UTI caused by E. coli, K. pneumoniae or P. mirabilis: Cefazolin is resistant if MIC > or = 8 mcg/mL. (Distinguishing susceptible versus intermediate for isolates with MIC < or = 4 mcg/mL requires additional testing.) Legend: S = Susceptible  I = Intermediate R = Resistant  NS = Not susceptible * = Not tested  NR = Not reported **NN = See antimicrobic comments   Blood culture (routine single)     Status: Abnormal (Preliminary result)   Collection Time: 03/26/22  2:14 AM   Specimen: Blood  Result Value Ref Range Status   MICRO NUMBER: [id number]  Preliminary   SPECIMEN QUALITY: Suboptimal  Preliminary   Source BLOOD 2  Preliminary   STATUS: PRELIMINARY  Preliminary   Result:   Preliminary    Inspection of blood culture bottles indicates that an inadequate volume of blood may have been collected for the detection of sepsis.   ISOLATE 1: Enterobacter aerogenes (AA)  Preliminary    Comment: Klebsiella aerogenes (Enterobacter) from aerobic bottle only   COMMENT: Aerobic and anaerobic bottle received.  Preliminary      Susceptibility   Enterobacter aerogenes - BLOOD CULTURE, NEGATIVE 1    AMOX/CLAVULANIC >=32 Resistant     CEFAZOLIN* >=64 Resistant      * For infections other than uncomplicated UTI caused by E. coli, K. pneumoniae or P. mirabilis: Cefazolin is resistant if MIC > or = 8 mcg/mL. (Distinguishing susceptible versus intermediate for isolates with MIC < or = 4 mcg/mL requires additional testing.)     CEFTAZIDIME <=1 Sensitive     CEFEPIME <=1 Sensitive     CEFTRIAXONE <=1 Sensitive     CIPROFLOXACIN <=0.25 Sensitive     LEVOFLOXACIN <=0.12 Sensitive     GENTAMICIN <=1 Sensitive     PIP/TAZO <=4 Sensitive     TOBRAMYCIN <=1 Sensitive     TRIMETH/SULFA* <=20 Sensitive      * For infections other than uncomplicated UTI caused by E. coli, K. pneumoniae or P.  mirabilis: Cefazolin is resistant if MIC > or = 8 mcg/mL. (Distinguishing susceptible versus intermediate for isolates with MIC < or = 4 mcg/mL requires additional testing.) Legend: S = Susceptible  I = Intermediate R = Resistant  NS = Not susceptible * = Not tested  NR = Not reported **NN = See antimicrobic comments     Antimicrobials: Anti-infectives (From admission, onward)    Start     Dose/Rate Route Frequency Ordered Stop   03/29/22 1700  fluconazole (DIFLUCAN) tablet 100 mg        100 mg Oral Daily 03/29/22 1552 04/03/22 0959   03/29/22 1600  meropenem (MERREM) 1 g in sodium chloride 0.9 % 100 mL IVPB  Status:  Discontinued        1 g 200 mL/hr over 30 Minutes Intravenous Every 12 hours 03/29/22 0223 03/29/22 1231   03/29/22 1400  ceFEPIme (MAXIPIME) 2 g in sodium chloride 0.9 % 100 mL IVPB        2 g 200 mL/hr over 30 Minutes Intravenous Every 12 hours 03/29/22 1244     03/29/22 0200  meropenem (MERREM) 1 g in sodium chloride 0.9 % 100 mL IVPB        1 g 200 mL/hr over 30 Minutes Intravenous  Once 03/29/22 0152 03/29/22 0713      Culture/Microbiology    Component Value Date/Time   SDES BLOOD PORTA CATH 11/23/2021 1222   SPECREQUEST  11/23/2021 1222  BOTTLES DRAWN AEROBIC AND ANAEROBIC Blood Culture adequate volume   CULT (A) 11/23/2021 1222    STAPHYLOCOCCUS EPIDERMIDIS THE SIGNIFICANCE OF ISOLATING THIS ORGANISM FROM A SINGLE VENIPUNCTURE CANNOT BE PREDICTED WITHOUT FURTHER CLINICAL AND CULTURE CORRELATION. SUSCEPTIBILITIES AVAILABLE ONLY ON REQUEST. Performed at [facility], [place] [address] [place], [place] [address]    REPTSTATUS 11/26/2021 FINAL 11/23/2021 1222    Other culture-see note  Radiology Studies: DG Chest 2 View  Result Date: 03/28/2022 CLINICAL DATA:  Fever cancer patient EXAM: CHEST - 2 VIEW COMPARISON:  03/22/2022, 01/23/2021, CT chest 04/04/2020, CT abdomen pelvis 07/15/2020 FINDINGS: Right-sided central venous port tip at the  cavoatrial region. No acute airspace disease or effusion. Stable cardiomediastinal silhouette with aortic atherosclerosis. No pneumothorax. Clips in the bilateral axilla. IMPRESSION: No active cardiopulmonary disease. Electronically Signed   By: Donavan Foil M.D.   On: 03/28/2022 23:52     LOS: 1 day   Antonieta Pert, MD [facility]  03/30/2022, 8:26 AM

## 2022-03-30 NOTE — Progress Notes (Signed)
Patient verbalized to this RN that she is supposed to be a DNR in her chart. Dr Maren Beach notified by this RN. Awaiting for status update in chart.

## 2022-03-30 NOTE — Progress Notes (Signed)
Initial Nutrition Assessment  DOCUMENTATION CODES:   Not applicable  INTERVENTION:  Monitor for diet advancement Will add nutrition supplements as appropriate  NUTRITION DIAGNOSIS:   Increased nutrient needs related to acute illness as evidenced by estimated needs.  GOAL:   Patient will meet greater than or equal to 90% of their needs  MONITOR:   Diet advancement, Labs, Weight trends  REASON FOR ASSESSMENT:   Malnutrition Screening Tool    ASSESSMENT:   Pt admitted with bacteremia d/t enterobacter species. PMH significant for breast cancer s/p bilateral mastectomy, non-Hodgkin lymphoma and Addison's disease.  Pt unavailable at time of visit. Per ID, plans for port removal and placement of temporary PICC line until current infection improves and is able to replace central line.   Pt currently NPO for procedure. Per nursing documentation, pt ate 65% of breakfast this morning. Will continue to follow pt and make nutrition recommendations as appropriate.   Reviewed wt history. Pt's noted to have had a 7.3% wt loss since 01/19. This is not clinically significant for time frame. Will continue to monitor throughout admission.   Edema: non-pitting BLE  Medications: solu-cortef, synthroid IV drips: abx, LR _0 /hr   Labs: Cr 1.01  NUTRITION - FOCUSED PHYSICAL EXAM: Deferred to follow up  Diet Order:   Diet Order             Diet NPO time specified  Diet effective now                   EDUCATION NEEDS:   No education needs have been identified at this time  Skin:  Skin Assessment: Reviewed RN Assessment  Last BM:  6/28  Height:   Ht Readings from Last 1 Encounters:  03/28/22 _1  (1.651 m)    Weight:   Wt Readings from Last 1 Encounters:  03/28/22 80.3 kg   BMI:  Body mass index is 29.45 kg/m.  Estimated Nutritional Needs:   Kcal:  2000-2200  Protein:  100-115g  Fluid:  >/=1.9L  Clayborne Dana, RDN, LDN Clinical Nutrition

## 2022-03-30 NOTE — Progress Notes (Signed)
PHARMACY NOTE:  ANTIMICROBIAL RENAL DOSAGE ADJUSTMENT  Current antimicrobial regimen includes a mismatch between antimicrobial dosage and estimated renal function.  As per policy approved by the Pharmacy & Therapeutics and Medical Executive Committees, the antimicrobial dosage will be adjusted accordingly.  Current antimicrobial dosage:  Cefepime 2 gm IV Q 12 hours   Indication: E aerogenes bacteremia   Renal Function:  Estimated Creatinine Clearance: 69 mL/min (A) (by C-G formula based on SCr of 1.01 mg/dL (H)). _0      On intermittent HD, scheduled: _1      On CRRT    Antimicrobial dosage has been changed to:  Cefepime 2 gm IV Q 8 hours   Additional comments:   Thank you for allowing pharmacy to be a part of this patient's care.  Jimmy Footman, PharmD, BCPS, BCIDP Infectious Diseases Clinical Pharmacist Phone: [phone] 03/30/2022 3:33 PM

## 2022-03-30 NOTE — TOC Progression Note (Signed)
Transition of Care [facility]) - Progression Note    Patient Details  Name: REMONA BOOM MRN: [id number] Date of Birth: 1971-08-01  Transition [facility]) CM/SW Contact  Purcell Mouton, RN Phone Number: 03/30/2022, 11:24 AM  Clinical Narrative:       Transition of Care (TOC) Screening Note   Patient Details  Name: SOLEDAD BUDREAU Date of Birth: [birth year]   Transition of Care [facility]) CM/SW Contact:    Purcell Mouton, RN Phone Number: 03/30/2022, 11:24 AM    Transition of Care Department (TOC) has reviewed patient and no TOC needs have been identified at this time. We will continue to monitor patient advancement through interdisciplinary progression rounds. If new patient transition needs arise, please place a TOC consult.        Expected Discharge Plan and Services                                                 Social Determinants of Health (SDOH) Interventions    Readmission Risk Interventions    04/27/2020    1:19 PM  Readmission Risk Prevention Plan  Transportation Screening Complete  PCP or Specialist Appt within 3-5 Days Complete  HRI or [place] Complete  Social Work Consult for [place] Planning/Counseling Complete  Palliative Care Screening Not Applicable  Medication Review [employer]) Complete

## 2022-03-30 NOTE — Sedation Documentation (Signed)
PICC placement complete. Patient prepped for port a cath removal.

## 2022-03-30 NOTE — Progress Notes (Signed)
[facility] for Infectious Disease  Date of Admission:  03/28/2022           Reason for visit: Follow up on line infection  Current antibiotics: Cefepime  ASSESSMENT:    51 y.o. female admitted with:  # Enterobacter aerogenes bacteremia from central line infection  -- discussed with Dr Serafina Royals (IR) yesterday regarding line strategy. -- option of port removal followed by antibiotics and then port replacement at later date would be most ideal from infection perspective.  However, patient very anxious and reports reliance on her Port for medication administration, etc  -- alternative option for port removal and separate PICC placement along with antibiotics appears to be a better option for patient.  She can then return at later date for new port and PICC removal once current infection treated -- Dr Serafina Royals felt that line exchange over wire would be least effective strategy for her   RECOMMENDATIONS:    Appreciate IR assistance with challenging situation regarding her line management Continue cefepime Once she has line situation dealt with I think she will otherwise be ready for discharge from ID perspective Would send home on Levaquin 776m PO daily x 10 days from date of Port removal Please call as needed.  Available over the weekend.  Dr MWest Balihere on Monday.  She [facility] but otherwise will just need IR follow up regarding her PICC and new Port in the future.     Principal Problem:   Bacteremia due to Enterobacter species Active Problems:   Hx of Hodgkins lymphoma   Hyperlipidemia   Chronic respiratory failure with hypoxia (HCC)   Fever   Moderate persistent asthma without complication   Port-A-Cath in place   [place]ison's disease ([place]   AKI (acute kidney injury) ([place]   Hypokalemia   Generalized anxiety disorder   Normocytic anemia    MEDICATIONS:    Scheduled Meds: . aspirin  325 mg Oral QHS  . buPROPion  150 mg  Oral Daily  . Chlorhexidine Gluconate Cloth  6 each Topical Daily  . dexlansoprazole  60 mg Oral Daily  . enoxaparin (LOVENOX) injection  40 mg Subcutaneous Q24H  . escitalopram  20 mg Oral QPM  . fluconazole  100 mg Oral Daily  . hydrocortisone  20 mg Oral Daily   And  . hydrocortisone  10 mg Oral QHS  . lamoTRIgine  100 mg Oral QHS  . levothyroxine  100 mcg Oral q morning  . metoprolol succinate  37.5 mg Oral Daily  . mometasone-formoterol  2 puff Inhalation BID  . rosuvastatin  10 mg Oral Daily  . sodium chloride flush  10-40 mL Intracatheter Q12H  . topiramate  150 mg Oral QHS   Continuous Infusions: . ceFEPime (MAXIPIME) IV Stopped (03/30/22 0347)  . lactated ringers 50 mL/hr at 03/30/22 0925   PRN Meds:.acetaminophen, albuterol, ALPRAZolam, diphenhydrAMINE, dronabinol, HYDROmorphone, sodium chloride flush, sucralfate, zolpidem  SUBJECTIVE:   24 hour events:  No acute events noted overnight She has been afebrile since admission WBC is normal    She is very anxious and tearful about what to do with her central line access and upcoming trip.  Review of Systems  All other systems reviewed and are negative.     OBJECTIVE:   Blood pressure 112/69, pulse 78, temperature 98.2 F (36.8 C), temperature source Oral, resp. rate 14, height _0  (1.651 m), weight 80.3 kg, SpO2 100 %. Body  mass index is 29.45 kg/m.  Physical Exam Constitutional:      General: She is not in acute distress.    Appearance: Normal appearance.  Eyes:     Extraocular Movements: Extraocular movements intact.     Conjunctiva/sclera: Conjunctivae normal.  Cardiovascular:     Comments: Right sided port in place.  Pulmonary:     Effort: Pulmonary effort is normal. No respiratory distress.  Skin:    General: Skin is warm and dry.  Neurological:     General: No focal deficit present.     Mental Status: She is alert and oriented to person, place, and time.  Psychiatric:     Comments: Tearful  and anxious.       Lab Results: Lab Results  Component Value Date   WBC 7.0 03/30/2022   HGB 8.6 (L) 03/30/2022   HCT 28.8 (L) 03/30/2022   MCV 80.0 03/30/2022   PLT 284 03/30/2022    Lab Results  Component Value Date   NA 142 03/30/2022   K 3.9 03/30/2022   CO2 23 03/30/2022   GLUCOSE 128 (H) 03/30/2022   BUN 15 03/30/2022   CREATININE 1.01 (H) 03/30/2022   CALCIUM 8.9 03/30/2022   GFRNONAA >60 03/30/2022   GFRAA >60 04/27/2020    Lab Results  Component Value Date   ALT 32 03/29/2022   AST 21 03/29/2022   ALKPHOS 83 03/29/2022   BILITOT 0.3 03/29/2022       Component Value Date/Time   CRP 1.3 (H) 11/13/2018 0005       Component Value Date/Time   ESRSEDRATE 90 (H) 11/17/2018 0415     I have reviewed the micro and lab results in Epic.  Imaging: DG Chest 2 View  Result Date: 03/28/2022 CLINICAL DATA:  Fever cancer patient EXAM: CHEST - 2 VIEW COMPARISON:  03/22/2022, 01/23/2021, CT chest 04/04/2020, CT abdomen pelvis 07/15/2020 FINDINGS: Right-sided central venous port tip at the cavoatrial region. No acute airspace disease or effusion. Stable cardiomediastinal silhouette with aortic atherosclerosis. No pneumothorax. Clips in the bilateral axilla. IMPRESSION: No active cardiopulmonary disease. Electronically Signed   By: Donavan Foil M.D.   On: 03/28/2022 23:52     Imaging independently reviewed in Epic.    [facility] [phone] pager 03/30/2022, 1:35 PM  I have personally spent 50 minutes involved in face-to-face and non-face-to-face activities for this patient on the day of the visit. Professional time spent includes the following activities: Preparing to see the patient (review of tests), Obtaining and/or reviewing separately obtained history (admission/discharge record), Performing a medically appropriate examination and/or evaluation , Ordering medications/tests/procedures,  referring and communicating with other health care professionals, Documenting clinical information in the EMR, Independently interpreting results (not separately reported), Communicating results to the patient/family/caregiver, Counseling and educating the patient/family/caregiver and Care coordination (not separately reported).

## 2022-03-30 NOTE — Consult Note (Signed)
Chief Complaint: Patient was seen in consultation today for bacteremia  Referring Physician(s): Dr. Jule Ser  Supervising Physician: Aletta Edouard  Patient Status: [facility] - In-pt  History of Present Illness: [name] is a 51 y.o. 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 She has had several episodes of bacteremia for which she is followed closely by Infectious Disease. She has had Port-A-Cath in place for several years requiring intermittent revision vs. Replacement. In the past 1 year she had left-sided Port removed due to erosion of the skin overlying the Port with a new right-sided Port-A-Cath placement 12/05/21.  She had had intermittent issues with the Port, namely discomfort, which is positional. Feels the Port is at time difficult to access. She was seen in [facility] 03/22/22 for a site check for pain at her [place] site at which time no issues with the [facility] function were identified.  She was admitted 03/29/22 for fevers at home.  ID has been consulted and recommends Port-A-Cath removal.   Patient assessed at bedside.  She is tearful about her history of chronic illness with multiple medical concerns and frequent needs of IV meds, labs, or infusions.  She has plans for upcoming travel and is anxious about joining her family on vacation without IV access.   Past Medical History:  Diagnosis Date   Addison's disease ([place])    Adrenal insufficiency ([place])    Anemia    Anxiety    Aortic stenosis    Aortic stenosis    Appendicitis 12/19/09   Appendicitis    Breast cancer (HCC)    STATUS POST BILATERAL MASTECTOMY. STATUS POST  RECONSTRUCTION. SHE HAD SILICONE BREAST IMPLANTS AND THE LEFT IMPLANT IS LEAKING SLIGHTLY   Cellulitis of right middle finger 11/07/2018   Cervical cancer ([place]) 12/23/2018   Chest pain    CHF with right heart failure ([place]) 04/17/2017   Chronic respiratory failure with hypoxia (HCC) 12/23/2018   Cough variant asthma 04/13/2019   Depression    GERD (gastroesophageal reflux disease)    takes Dexilant and carafate and gi coctail    Headache    migraines on a daily and monthly regimen    Heart murmur    History of kidney stones    Hodgkin lymphoma ([place])    STATUS POST MANTLE RADIATION   Hodgkin's lymphoma ([place])    1987   Hypertension    Hypoxia    Necrotizing fasciitis ([place]) 12/23/2018   Non-ischemic cardiomyopathy ([place])    Osteoporosis    Palpitations    Pituitary adenoma ([place]) 12/23/2018   Pneumonia    PONV (postoperative nausea and vomiting)    Pre-diabetes    per pt; no meds   Pulmonary hypertension ([place]) 12/23/2018   Raynaud phenomenon    Right heart failure ([place]) 04/17/2017   Seizures ([place])    last febrile seizure was approx 3 weeks ago per report on 12/01/2020   Sleep apnea    upcoming sleep study per pt    Supplemental oxygen dependent    3 liters   SVT (supraventricular tachycardia) (HCC)    Tachycardia    Thyroid cancer ([place])    STATUS POST SURGICAL REMOVAL-CURRENT ON THYROID REPLACEMENT    Past Surgical History:  Procedure  Laterality Date   ABDOMINAL HYSTERECTOMY     AMPUTATION Left 01/30/2019   Procedure: Left Index finger amputation with flap reconstruction and repair reconstruction;  Surgeon: Roseanne Kaufman, MD;  Location: [facility];  Service: Orthopedics;  Laterality: Left;   APPENDECTOMY     breast implants and removal      breast implants but leaking      CARDIAC CATHETERIZATION  05/18/09   NORMAL CATH   COLONOSCOPY     hx of chemotherapy      hx of radiation therapy      I & D EXTREMITY Left 12/23/2018   Procedure: IRRIGATION AND DEBRIDEMENT HAND / INDEX FINGER;   Surgeon: Roseanne Kaufman, MD;  Location: [facility];  Service: Orthopedics;  Laterality: Left;   IR CV LINE INJECTION  03/22/2022   IR IMAGING GUIDED PORT INSERTION  05/06/2020   IR IMAGING GUIDED PORT INSERTION  12/04/2021   IR REMOVAL TUN ACCESS W/ PORT W/O FL MOD SED  04/27/2020   IR REMOVAL TUN ACCESS W/ PORT W/O FL MOD SED  12/04/2021   KIDNEY STONE SURGERY     LUMBAR PUNCTURE W/ INTRATHECAL CHEMOTHERAPY     MASTECTOMY     PITUITARY SURGERY     RIGHT/LEFT HEART CATH AND CORONARY ANGIOGRAPHY N/A 04/02/2018   Procedure: RIGHT/LEFT HEART CATH AND CORONARY ANGIOGRAPHY;  Surgeon: Burnell Blanks, MD;  Location: [facility];  Service: Cardiovascular;  Laterality: N/A;   TOOTH EXTRACTION N/A 12/05/2020   Procedure: DENTAL RESTORATION/EXTRACTIONS;  Surgeon: Ronal Fear, MD;  Location: [facility];  Service: Oral Surgery;  Laterality: N/A;   TOTAL THYROIDECTOMY     VIDEO BRONCHOSCOPY Bilateral 11/14/2018   Procedure: VIDEO BRONCHOSCOPY WITHOUT FLUORO;  Surgeon: Margaretha Seeds, MD;  Location: [facility];  Service: Cardiopulmonary;  Laterality: Bilateral;   VIDEO BRONCHOSCOPY WITH ENDOBRONCHIAL ULTRASOUND N/A 11/19/2018   Procedure: VIDEO BRONCHOSCOPY WITH ENDOBRONCHIAL ULTRASOUND;  Surgeon: Margaretha Seeds, MD;  Location: [facility];  Service: Thoracic;  Laterality: N/A;    Allergies: Ferrous bisglycinate chelate [iron], Mushroom extract complex, Na ferric gluc cplx in sucrose, Cymbalta [duloxetine hcl], Phenergan [promethazine], Promethazine hcl, Succinylcholine, Buprenorphine hcl, Compazine, Metoclopramide, Morphine and related, Ondansetron, Promethazine hcl, and Tegaderm ag mesh [silver]  Medications: Prior to Admission medications   Medication Sig Start Date End Date Taking? Authorizing Provider  albuterol (VENTOLIN HFA) 108 (90 Base) MCG/ACT inhaler Inhale 2 puffs into the lungs every 6 (six) hours as needed for wheezing or shortness of breath.   Yes [provider]   ALPRAZolam (XANAX) 0.5 MG tablet Take 1 tablet (0.5 mg total) by mouth 2 (two) times daily as needed for anxiety. 02/15/22  Yes Mozingo, Berdie Ogren, NP  aspirin 325 MG tablet Take 325 mg by mouth at bedtime.    Yes [provider]  bumetanide (BUMEX) 1 MG tablet TAKE 1 TABLET BY MOUTH TWICE A DAY Patient taking differently: Take 1 mg by mouth 2 (two) times daily. 07/17/21  Yes Nicholas Lose, MD  buPROPion (WELLBUTRIN XL) 150 MG 24 hr tablet Take 1 tablet (150 mg total) by mouth daily. 03/27/22  Yes Mozingo, Berdie Ogren, NP  dexlansoprazole (DEXILANT) 60 MG capsule Take 60 mg by mouth daily.   Yes [provider]  Diclofenac Potassium,Migraine, (CAMBIA) 50 MG PACK Take 1 packet by mouth as needed for migraine. 05/16/21  Yes [provider]  diphenhydrAMINE (BENADRYL) 50 MG/ML injection Inject 1 mL (50 mg total) into the vein every 6 (six) hours as  needed (nausea). 12/25/18  Yes Rai, Ripudeep K, MD  dronabinol (MARINOL) 2.5 MG capsule TAKE 1 CAPSULE BY MOUTH TWICE DAILY AS NEEDED Patient taking differently: Take 2.5 mg by mouth 2 (two) times daily as needed (nausea). 12/26/21  Yes Nicholas Lose, MD  EPINEPHrine 0.3 mg/0.3 mL IJ SOAJ injection Inject 0.3 mg into the muscle as needed for anaphylaxis. 06/13/21  Yes Margaretha Seeds, MD  escitalopram (LEXAPRO) 20 MG tablet Take 1 tablet (20 mg total) by mouth every evening. 11/08/21  Yes Mozingo, Berdie Ogren, NP  fluticasone-salmeterol (ADVAIR HFA) 230-21 MCG/ACT inhaler Inhale 2 puffs into the lungs 2 (two) times daily. 02/02/22  Yes Margaretha Seeds, MD  Galcanezumab-gnlm 120 MG/ML SOAJ Inject 120 mg into the skin every 28 (twenty-eight) days.  03/31/19  Yes [provider]  hydrocortisone (CORTEF) 10 MG tablet Take 1-2 tablets (10-20 mg total) by mouth See admin instructions. Take 20 mg in the am and 11m in the evening Patient taking differently: Take 10-20 mg by mouth See admin instructions. Take 20 mg by mouth  in the morning and 166min the evening 02/17/20  Yes Kyle, Tyrone A, DO  lamoTRIgine (LAMICTAL) 100 MG tablet Take one tablet at bedtime. 03/23/22  Yes Mozingo, ReBerdie OgrenNP  metoprolol succinate (TOPROL-XL) 25 MG 24 hr tablet Take 37.5 mg by mouth daily.    Yes [provider]  Rimegepant Sulfate (NURTEC) 75 MG TBDP Take 75 mg by mouth daily as needed (Migraine).    Yes [provider]  rosuvastatin (CRESTOR) 10 MG tablet Take 10 mg by mouth daily.  02/28/18  Yes [provider]  sitaGLIPtin (JANUVIA) 100 MG tablet Take 1 tablet (100 mg total) by mouth daily. 10/19/21  Yes GuNicholas LoseMD  sucralfate (CARAFATE) 1 g tablet Take 1 g by mouth daily as needed (FOR ULCERS).    Yes [provider]  sulfamethoxazole-trimethoprim (BACTRIM DS) 800-160 MG tablet Take 1 tablet by mouth 2 (two) times daily. 03/27/22  Yes Comer, RoOkey RegalMD  SYNTHROID 100 MCG tablet Take 1 tablet (100 mcg total) by mouth every morning. 10/19/21  Yes GuNicholas LoseMD  topiramate (TOPAMAX) 50 MG tablet Take 150 mg by mouth at bedtime.  12/25/19  Yes [provider]  zolpidem (AMBIEN CR) 12.5 MG CR tablet Take 12.5 mg by mouth at bedtime as needed. 03/05/22  Yes [provider]  albuterol (PROVENTIL) (2.5 MG/3ML) 0.083% nebulizer solution Take 3 mLs (2.5 mg total) by nebulization every 6 (six) hours as needed for wheezing or shortness of breath. 03/27/21 06/16/21  GrMagdalen SpatzNP  fluconazole (DIFLUCAN) 150 MG tablet Take 1 tablet (150 mg total) by mouth daily. If needed for yeast infection Patient not taking: Reported on 03/02/2022 11/24/21   SnCarlyle BasquesMD  heparin lock flush 100 UNIT/ML SOLN injection USE 5 MLS AS A HEPLOCK IN PORT A CATH ONCE DAILY AFTER MEDICATION ADMINISTRATION OR AS DIRECTED BY PHYSICIAN. 10/30/21 10/30/22  GuNicholas LoseMD  Lancets (ONETOUCH DELICA PLUS [id number] (three) times daily. for testing 06/12/21   [provider]  ONHoly Name HospitalVERIO test strip 3 (three) times daily. for testing 06/12/21   [provider]  OXYGEN Inhale 3-4 L into the lungs continuous.    [provider]  potassium chloride (KLOR-CON) 10 MEQ tablet Take 1 tablet (10 mEq total) by mouth daily for 21 days. 07/15/20 08/05/20  PaAlfredia ClientPA-C  predniSONE (DELTASONE) 10 MG tablet Take 4 tabs x  2 days, then 3 x 2d, then 2 x 2d, then 1 x 2d and STOP Patient not taking: Reported on 03/29/2022 03/27/21   Magdalen Spatz, NP  Sodium Chloride Flush (NORMAL SALINE FLUSH) 0.9 % SOLN FLUSH WITH TWO 10 ML FLUSHES IN PORT BEFORE AND AFTER FLUIDS AND MEDICATION ADMINISTRATION, TO MAINTAIN PATENCY--USE UP TO 4 TIMES DAILY. 10/30/21 10/30/22  Nicholas Lose, MD     Family History  Family history unknown: Yes    Social History   Socioeconomic History   Marital status: Married    Spouse name: Not on file   Number of children: Not on file   Years of education: Not on file   Highest education level: Not on file  Occupational History   Not on file  Tobacco Use   Smoking status: Never   Smokeless tobacco: Never  Vaping Use   Vaping Use: Never used  Substance and Sexual Activity   Alcohol use: Not Currently    Comment: social    Drug use: No   Sexual activity: Yes    Birth control/protection: Surgical  Other Topics Concern   Not on file  Social History Narrative   Not on file   Social Determinants of Health   Financial Resource Strain: Not on file  Food Insecurity: Not on file  Transportation Needs: Not on file  Physical Activity: Not on file  Stress: Not on file  Social Connections: Not on file     Review of Systems: A 12 point ROS discussed and pertinent positives are indicated in the HPI above.  All other systems are negative.  Review of Systems  Constitutional:  Positive for fatigue and fever.  Respiratory:  Positive for shortness of breath. Negative for cough.   Cardiovascular:  Negative for chest pain.  Gastrointestinal:   Positive for nausea. Negative for abdominal pain and diarrhea.  Musculoskeletal:  Negative for back pain.  Psychiatric/Behavioral:  Negative for behavioral problems and confusion.     Vital Signs: BP 123/65 (BP Location: Right Arm)   Pulse 82   Temp 98.2 F (36.8 C) (Oral)   Resp 17   Ht _0  (1.651 m)   Wt 177 lb (80.3 kg)   LMP  (LMP Unknown)   SpO2 100%   BMI 29.45 kg/m   Physical Exam Vitals and nursing note reviewed.  Constitutional:      General: She is not in acute distress.    Appearance: Normal appearance. She is ill-appearing.  HENT:     Mouth/Throat:     Mouth: Mucous membranes are moist.  Cardiovascular:     Rate and Rhythm: Normal rate and regular rhythm.  Pulmonary:     Effort: Pulmonary effort is normal. No respiratory distress.  Abdominal:     General: Abdomen is flat.     Palpations: Abdomen is soft.  Skin:    General: Skin is warm and dry.  Neurological:     General: No focal deficit present.     Mental Status: She is alert and oriented to person, place, and time. Mental status is at baseline.  Psychiatric:        Mood and Affect: Mood normal.        Behavior: Behavior normal.        Thought Content: Thought content normal.        Judgment: Judgment normal.     MD Evaluation Airway: WNL Heart: WNL Abdomen: WNL Chest/ Lungs:  (on nasal cannula) ASA  Classification: 3 Mallampati/Airway Score:  Two   Imaging: DG Chest 2 View  Result Date: 03/28/2022 CLINICAL DATA:  Fever cancer patient EXAM: CHEST - 2 VIEW COMPARISON:  03/22/2022, 01/23/2021, CT chest 04/04/2020, CT abdomen pelvis 07/15/2020 FINDINGS: Right-sided central venous port tip at the cavoatrial region. No acute airspace disease or effusion. Stable cardiomediastinal silhouette with aortic atherosclerosis. No pneumothorax. Clips in the bilateral axilla. IMPRESSION: No active cardiopulmonary disease. Electronically Signed   By: Donavan Foil M.D.   On: 03/28/2022 23:52   IR CV Line  Injection  Result Date: 03/23/2022 INDICATION: Port site check. EXAM: ESTABLISHED PATIENT OFFICE VISIT CHIEF COMPLAINT: Please see epic note for full details. HISTORY OF PRESENT ILLNESS: Please see epic note for full details. REVIEW OF SYSTEMS: Please see epic note for full details. PHYSICAL EXAMINATION: Please see epic note for full details. ASSESSMENT AND PLAN: Please see epic note for full details. Electronically Signed   By: Ruthann Cancer M.D.   On: 03/23/2022 16:38   DG Chest Port 1 View  Result Date: 03/22/2022 CLINICAL DATA:  Port-A-Cath in place EXAM: PORTABLE CHEST 1 VIEW COMPARISON:  Radiograph 01/23/2021 FINDINGS: Left-sided port is been removed. Interval placement of a right chest port with catheter tip overlying the right atrium. Unchanged cardiomediastinal silhouette. There is no focal airspace consolidation. There is no pleural effusion. No pneumothorax. There is no acute osseous abnormality. IMPRESSION: No evidence of acute cardiopulmonary disease. Right chest port catheter tip overlies the right atrium. Electronically Signed   By: Maurine Simmering M.D.   On: 03/22/2022 16:43    Labs:  CBC: Recent Labs    02/09/22 1202 03/28/22 0127 03/29/22 0608 03/30/22 0500  WBC 8.1 8.6 8.4 7.0  HGB 10.4* 10.1* 9.3* 8.6*  HCT 33.4* 33.0* 29.8* 28.8*  PLT 357 348 311 284    COAGS: No results for input(s): "INR", "APTT" in the last 8760 hours.  BMP: Recent Labs    02/09/22 1202 03/28/22 0127 03/29/22 0608 03/30/22 0500  NA 139 141 142 142  K 3.7 3.5 3.4* 3.9  CL 105 109 112* 113*  CO2 26 21* 21* 23  GLUCOSE 104* 147* 90 128*  BUN 21* _0 CALCIUM 9.0 9.0 8.3* 8.9  CREATININE 1.08* 1.51* 1.35* 1.01*  GFRNONAA >60 42* 48* >60    LIVER FUNCTION TESTS: Recent Labs    11/23/21 1222 02/09/22 1202 03/28/22 0127 03/29/22 0608  BILITOT 0.4 0.4 0.3 0.3  AST _1 ALT 34 40 35 32  ALKPHOS 95 91 87 83  PROT 7.9 7.9 8.3* 7.1  ALBUMIN 4.3 4.0 4.1 3.4*    TUMOR  MARKERS: No results for input(s): "AFPTM", "CEA", "CA199", "CHROMGRNA" in the last 8760 hours.  Assessment and Plan: 51 y.o. female with a past medical history listed below including Addison's disease, breast cancer status post bilateral mastectomy not currently on chemo and non-Hodgkin's lymphoma requiring Port-A-Cath who recently grew out gram-negative bacilli on blood cultures drawn 03/26/22 IR consulted for Port-A-Cath removal.  Patient requesting IV access during [place] holiday to facilitate med administration at home. Discussed with Dr. Juleen China.  IR can plan to place PICC.  Patient NPO.  She is hopeful for discharge soon.   Risks and benefits of image guided Port removal was discussed with the patient including, but not limited to bleeding, infection, pneumothorax, or fibrin sheath development and need for additional procedures.  All of the patient's questions were answered, patient is agreeable to proceed. Consent signed and in chart.  Thank you  for this interesting consult.  I greatly enjoyed meeting TAMERA PINGLEY and look forward to participating in their care.  A copy of this report was sent to the requesting provider on this date.  Electronically Signed: Docia Barrier, PA 03/30/2022, 9:17 AM   I spent a total of 40 Minutes    in face to face in clinical consultation, greater than 50% of which was counseling/coordinating care for bacteremia.

## 2022-03-30 NOTE — Progress Notes (Signed)
Patient refused AM dose of Lovenox and her CHG wipes. She said "I never take those" and "the wipes make my skin irritated". Dr Maren Beach notified of refusal by this RN. Education provided by this RN of the risks of refusal and education provided.

## 2022-03-31 DIAGNOSIS — R7881 Bacteremia: Secondary | ICD-10-CM | POA: Diagnosis not present

## 2022-03-31 DIAGNOSIS — B9689 Other specified bacterial agents as the cause of diseases classified elsewhere: Secondary | ICD-10-CM | POA: Diagnosis not present

## 2022-03-31 LAB — CULTURE, BLOOD (SINGLE)
MICRO NUMBER:: 13573057
SPECIMEN QUALITY:: ADEQUATE

## 2022-03-31 LAB — BASIC METABOLIC PANEL
Anion gap: 6 (ref 5–15)
BUN: 19 mg/dL (ref 6–20)
CO2: 23 mmol/L (ref 22–32)
Calcium: 8.9 mg/dL (ref 8.9–10.3)
Chloride: 108 mmol/L (ref 98–111)
Creatinine, Ser: 0.87 mg/dL (ref 0.44–1.00)
GFR, Estimated: >60 mL/min (ref >60)
Glucose, Bld: 115 mg/dL — ABNORMAL HIGH (ref 70–99)
Potassium: 3.8 mmol/L (ref 3.5–5.1)
Sodium: 137 mmol/L (ref 135–145)

## 2022-03-31 LAB — CBC
HCT: 27.2 % — ABNORMAL LOW (ref 36.0–46.0)
Hemoglobin: 8.1 g/dL — ABNORMAL LOW (ref 12.0–15.0)
MCH: 23.9 pg — ABNORMAL LOW (ref 26.0–34.0)
MCHC: 29.8 g/dL — ABNORMAL LOW (ref 30.0–36.0)
MCV: 80.2 fL (ref 80.0–100.0)
Platelets: 282 10*3/uL (ref 150–400)
RBC: 3.39 MIL/uL — ABNORMAL LOW (ref 3.87–5.11)
RDW: 17.3 % — ABNORMAL HIGH (ref 11.5–15.5)
WBC: 8.7 10*3/uL (ref 4.0–10.5)
nRBC: 0 % (ref 0.0–0.2)

## 2022-03-31 MED ORDER — LEVOFLOXACIN 750 MG PO TABS: 750.0000 mg | ORAL_TABLET | Freq: Every day | ORAL | 0 refills | Status: DC

## 2022-03-31 MED ORDER — SACCHAROMYCES BOULARDII 250 MG PO CAPS: 250.0000 mg | ORAL_CAPSULE | Freq: Two times a day (BID) | ORAL | 0 refills | Status: AC

## 2022-03-31 NOTE — Progress Notes (Signed)
Overnight pt expressed anxiety about Picc line and longterm plan goals. Given PRN medication as requested.   Barriers to discharge: family/friend not available until after 12noon today

## 2022-03-31 NOTE — Discharge Summary (Signed)
Physician Discharge Summary  [name] FTD:[id number] DOB: 31-Dec-1970 DOA: 03/28/2022  PCP: Nicholas Lose, MD  Admit date: 03/28/2022 Discharge date: 03/31/2022 Recommendations for Outpatient Follow-up:  Follow up with PCP in 1 weeks-call for appointment Please obtain BMP/CBC in one week  Discharge Dispo: home Discharge Condition: Stable Code Status:   Code Status: DNR Diet recommendation:  Diet Order             Diet regular Room service appropriate? Yes; Fluid consistency: Thin  Diet effective now                    Brief/Interim Summary: 51 y.o.f w/ history significant for breast cancer status post bilateral mastectomy, not currently on chemotherapy, NHL,W/ Port-A-Cath, Addison's disease, who recently grew out gram-negative bacilli on blood culture drawn on 03/26/2022 as outpatient due to recurrent fevers and was started on Bactrim and had taken 2 doses of the antibiotics, and was having to persistent fevers with Tmax 101, and was asked by Dr. Juleen China to go to ED.  in ED-Tmax 98.2.  BP 125/68, pulse 94, respiratory 18, saturation 100% on room air.  Lab studies remarkable for Enterobacter aerogenes and gram-negative bacilli isolated bacteremia.  Serum bicarb 21, glucose 147, creatinine 1.51 with baseline creatinine 1.08.  GFR 42 with baseline creatinine of greater than 60.  Hemoglobin 10.1 with baseline hemoglobin 11.5.  MCV 78.  Platelet 348.  Started on meropenem and admitted.Has remained afebrile since admission. Seen by ID and IR subsequently underwent right chest port removal given concern for line associated bacteremia, PICC line was placed on the left arm due to patient's continuous need for IV medication at home At this point patient hemoglobin stable, AKI is resolved, afebrile.  As per ID recommendation she will be discharged home, she will continue :on Levaquin 710m PO daily x 10 days from date of Port removal.[facility] but otherwise  will just need IR follow up regarding her PICC and new Port in the future.      Discharge Diagnoses:  Principal Problem:   Bacteremia due to Enterobacter species Active Problems:   Hx of Hodgkins lymphoma   Hyperlipidemia   Chronic respiratory failure with hypoxia (HCC)   Fever   Moderate persistent asthma without complication   Port-A-Cath in place   [place]'s disease ([place]   AKI (acute kidney injury) ([place]   Hypokalemia   Generalized anxiety disorder   Normocytic anemia  Bacteremia due to Enterobacter species 6/26 on outpatient test Fever: Having 2 solates Enterobacter aerogenes  and gram-negative bacilli in the blood culture.bacteremia secondary to right sided port.   ID and IR subsequently underwent right chest port removal given concern for line associated bacteremia, PICC line was placed on the left arm due to patient's continuous need for IV medication at home At this point patient hemoglobin stable, AKI is resolved, afebrile.  As per ID recommendation she will be discharged home, she will continue :on Levaquin 7534mPO daily x 10 days from date of Port removal.She [facility] but otherwise will just need IR follow up regarding her PICC and new Port in the future.   History of NHL History of breast cancer S/P bilateral mastectomy: Follow-up with the provider Port-A-Cath in place/difficult stick-reports is very difficult to stick and needs IV Benadryl round-the-clock at home and has port for that reason.   Migraine: cont prn meds Nausea/vomiting: Has had  issues at home 2.  cont home IV benadryl, dexilant Chronic pain continue hom meds AKI: Baseline creatinine 1.0: resolved on ivf. continue to hold Bumex for now Hypokalemia: resolved Hyperlipidemia: Continue statin Chronic respiratory failure with hypoxia -on home setting Moderate persistent asthma without complication:Continue supplemental oxygen, Dulera albuterol nebulizer as  needed Addison's disease: Continue po hydrocortisone-if persistent vomiting will change to IV.  Overall doing well and hemodynamically stable-requesting hydrocortisone IV 100 MG x1 prior to procedure- will get pharmacy to order it. Generalized anxiety disorder: Continue her Wellbutrin and just psychotropic medication Normocytic anemia: Hemoglobin downtrending, patient reports she has had bright bleeding rectally, PCP and GI aware and have follow-up planned soon, history of colonoscopy 2019 and has hemorrhoids.  Currently no active bleeding.  Likely hemorrhoidal bleeding.  Encouraged to follow-up with her PCP and GI.  Advised to check CBC next week.   GOC: Skin is requesting DNR  Consults: IR, ID Subjective: AAOX3, resting well  Discharge Exam: Vitals:   03/30/22 2227 03/31/22 0527  BP: 129/71 105/71  Pulse: 87 76  Resp: 20 19  Temp: 98.1 F (36.7 C) 97.6 F (36.4 C)  SpO2: 98% 100%   General: Pt is alert, awake, not in acute distress Cardiovascular: RRR, S1/S2 +, no rubs, no gallops Respiratory: CTA bilaterally, no wheezing, no rhonchi Abdominal: Soft, NT, ND, bowel sounds + Extremities: no edema, no cyanosis  Discharge Instructions  Discharge Instructions     Discharge instructions   Complete by: As directed    Please call call MD or return to ER for similar or worsening recurring problem that brought you to hospital or if any fever,nausea/vomiting,abdominal pain, uncontrolled pain, chest pain,  shortness of breath or any other alarming symptoms.  Please follow-up your doctor as instructed in a week time and call the office for appointment.  Please avoid alcohol, smoking, or any other illicit substance and maintain healthy habits including taking your regular medications as prescribed.  You were cared for by a hospitalist during your hospital stay. If you have any questions about your discharge medications or the care you received while you were in the hospital after you are  discharged, you can call the unit and ask to speak with the hospitalist on call if the hospitalist that took care of you is not available.  Once you are discharged, your primary care physician will handle any further medical issues. Please note that NO REFILLS for any discharge medications will be authorized once you are discharged, as it is imperative that you return to your primary care physician (or establish a relationship with a primary care physician if you do not have one) for your aftercare needs so that they can reassess your need for medications and monitor your lab values   Discharge wound care:   Complete by: As directed    Cover with dressing   Increase activity slowly   Complete by: As directed       Allergies as of 03/31/2022       Reactions   Ferrous Bisglycinate Chelate [iron] Anaphylaxis   Only IV    Mushroom Extract Complex Anaphylaxis   Na Ferric Gluc Cplx In Sucrose Anaphylaxis   Cymbalta [duloxetine Hcl] Swelling, Anxiety   Phenergan [promethazine]    Other reaction(s): Unknown   Promethazine Hcl    Other reaction(s): Unknown   Succinylcholine Other (See Comments)   Lock Jaw   Buprenorphine Hcl Hives   Compazine Other (See Comments)   Altered mental status Aggression  Metoclopramide Other (See Comments)   Dystonia   Morphine And Related Hives   Ondansetron Hives, Rash   Other reaction(s): Unknown Other reaction(s): Unknown   Promethazine Hcl Hives   Tegaderm Ag Mesh [silver] Rash   Old formulation only, is able to tolerate new formulation        Medication List     STOP taking these medications    sulfamethoxazole-trimethoprim 800-160 MG tablet Commonly known as: BACTRIM DS       TAKE these medications    albuterol 108 (90 Base) MCG/ACT inhaler Commonly known as: VENTOLIN HFA Inhale 2 puffs into the lungs every 6 (six) hours as needed for wheezing or shortness of breath.   albuterol (2.5 MG/3ML) 0.083% nebulizer solution Commonly known  as: PROVENTIL Take 3 mLs (2.5 mg total) by nebulization every 6 (six) hours as needed for wheezing or shortness of breath.   ALPRAZolam 0.5 MG tablet Commonly known as: XANAX Take 1 tablet (0.5 mg total) by mouth 2 (two) times daily as needed for anxiety.   aspirin 325 MG tablet Take 325 mg by mouth at bedtime.   bumetanide 1 MG tablet Commonly known as: BUMEX TAKE 1 TABLET BY MOUTH TWICE A DAY What changed: when to take this   buPROPion 150 MG 24 hr tablet Commonly known as: Wellbutrin XL Take 1 tablet (150 mg total) by mouth daily.   Cambia 50 MG Pack Generic drug: Diclofenac Potassium(Migraine) Take 1 packet by mouth as needed for migraine.   dexlansoprazole 60 MG capsule Commonly known as: DEXILANT Take 60 mg by mouth daily.   diphenhydrAMINE 50 MG/ML injection Commonly known as: BENADRYL Inject 1 mL (50 mg total) into the vein every 6 (six) hours as needed (nausea).   dronabinol 2.5 MG capsule Commonly known as: MARINOL TAKE 1 CAPSULE BY MOUTH TWICE DAILY AS NEEDED What changed: See the new instructions.   EPINEPHrine 0.3 mg/0.3 mL Soaj injection Commonly known as: EPI-PEN Inject 0.3 mg into the muscle as needed for anaphylaxis.   escitalopram 20 MG tablet Commonly known as: LEXAPRO Take 1 tablet (20 mg total) by mouth every evening.   fluconazole 150 MG tablet Commonly known as: DIFLUCAN Take 1 tablet (150 mg total) by mouth daily. If needed for yeast infection   fluticasone-salmeterol 230-21 MCG/ACT inhaler Commonly known as: Advair HFA Inhale 2 puffs into the lungs 2 (two) times daily.   Galcanezumab-gnlm 120 MG/ML Soaj Inject 120 mg into the skin every 28 (twenty-eight) days.   heparin lock flush 100 UNIT/ML Soln injection USE 5 MLS AS A HEPLOCK IN PORT A CATH ONCE DAILY AFTER MEDICATION ADMINISTRATION OR AS DIRECTED BY PHYSICIAN.   hydrocortisone 10 MG tablet Commonly known as: CORTEF Take 1-2 tablets (10-20 mg total) by mouth See admin  instructions. Take 20 mg in the am and 61m in the evening What changed: additional instructions   lamoTRIgine 100 MG tablet Commonly known as: LAMICTAL Take one tablet at bedtime.   levofloxacin 750 MG tablet Commonly known as: Levaquin Take 1 tablet (750 mg total) by mouth daily for 10 days.   metoprolol succinate 25 MG 24 hr tablet Commonly known as: TOPROL-XL Take 37.5 mg by mouth daily.   Normal Saline Flush 0.9 % Soln FLUSH WITH TWO 10 ML FLUSHES IN PORT BEFORE AND AFTER FLUIDS AND MEDICATION ADMINISTRATION, TO MAINTAIN PATENCY--USE UP TO 4 TIMES DAILY.   Nurtec 75 MG Tbdp Generic drug: Rimegepant Sulfate Take 75 mg by mouth daily as needed (Migraine).  OneTouch Delica Plus [id number] Misc 3 (three) times daily. for testing   OneTouch Verio test strip Generic drug: glucose blood 3 (three) times daily. for testing   OXYGEN Inhale 3-4 L into the lungs continuous.   potassium chloride 10 MEQ tablet Commonly known as: KLOR-CON Take 1 tablet (10 mEq total) by mouth daily for 21 days.   predniSONE 10 MG tablet Commonly known as: DELTASONE Take 4 tabs x 2 days, then 3 x 2d, then 2 x 2d, then 1 x 2d and STOP   rosuvastatin 10 MG tablet Commonly known as: CRESTOR Take 10 mg by mouth daily.   saccharomyces boulardii 250 MG capsule Commonly known as: Florastor Take 1 capsule (250 mg total) by mouth 2 (two) times daily for 14 days.   sitaGLIPtin 100 MG tablet Commonly known as: Januvia Take 1 tablet (100 mg total) by mouth daily.   sucralfate 1 g tablet Commonly known as: CARAFATE Take 1 g by mouth daily as needed (FOR ULCERS).   Synthroid 100 MCG tablet Generic drug: levothyroxine Take 1 tablet (100 mcg total) by mouth every morning.   topiramate 50 MG tablet Commonly known as: TOPAMAX Take 150 mg by mouth at bedtime.   zolpidem 12.5 MG CR tablet Commonly known as: AMBIEN CR Take 12.5 mg by mouth at bedtime as needed.               Discharge Care  Instructions  (From admission, onward)           Start     Ordered   03/31/22 0000  Discharge wound care:       Comments: Cover with dressing   03/31/22 1025            Follow-up Information     Carlyle Basques, MD Follow up in 1 week(s).   Specialty: Infectious Diseases Contact information: [address] [place] [address] [phone]                Allergies  Allergen Reactions   Ferrous Bisglycinate Chelate [Iron] Anaphylaxis    Only IV    Mushroom Extract Complex Anaphylaxis   Na Ferric Gluc Cplx In Sucrose Anaphylaxis   Cymbalta [Duloxetine Hcl] Swelling and Anxiety   Phenergan [Promethazine]     Other reaction(s): Unknown   Promethazine Hcl     Other reaction(s): Unknown   Succinylcholine Other (See Comments)    Lock Jaw   Buprenorphine Hcl Hives   Compazine Other (See Comments)    Altered mental status Aggression   Metoclopramide Other (See Comments)    Dystonia   Morphine And Related Hives   Ondansetron Hives and Rash    Other reaction(s): Unknown Other reaction(s): Unknown   Promethazine Hcl Hives   Tegaderm Ag Mesh [Silver] Rash    Old formulation only, is able to tolerate new formulation    The results of significant diagnostics from this hospitalization (including imaging, microbiology, ancillary and laboratory) are listed below for reference.    Microbiology: Recent Results (from the past 240 hour(s))  Blood culture (routine single)     Status: Abnormal   Collection Time: 03/26/22  2:14 AM   Specimen: Blood  Result Value Ref Range Status   MICRO NUMBER: [id number]  Final   SPECIMEN QUALITY: Adequate  Final   Source BLOOD 1  Final   STATUS: FINAL  Final   ISOLATE 1: Enterobacter aerogenes (AA)  Final    Comment: Klebsiella aerogenes (Enterobacter) , from anaerobic bottle  only   ISOLATE 2: Pantoea agglomerans (AA)  Final    Comment: Pantoea agglomerans from aerobic bottle only   COMMENT: Aerobic and anaerobic  bottle received.  Final      Susceptibility   Enterobacter aerogenes - BLOOD CULTURE, NEGATIVE 1    AMOX/CLAVULANIC >=32 Resistant     CEFAZOLIN* >=64 Resistant      * For infections other than uncomplicated UTI caused by E. coli, K. pneumoniae or P. mirabilis: Cefazolin is resistant if MIC > or = 8 mcg/mL. (Distinguishing susceptible versus intermediate for isolates with MIC < or = 4 mcg/mL requires additional testing.)     CEFTAZIDIME <=1 Sensitive     CEFEPIME <=1 Sensitive     CEFTRIAXONE <=1 Sensitive     CIPROFLOXACIN <=0.25 Sensitive     LEVOFLOXACIN <=0.12 Sensitive     GENTAMICIN <=1 Sensitive     IMIPENEM 1 Sensitive     PIP/TAZO <=4 Sensitive     TOBRAMYCIN <=1 Sensitive     TRIMETH/SULFA <=20 Sensitive    Pantoea agglomerans - BLOOD CULTURE, NEGATIVE 2    AMOX/CLAVULANIC 4 Sensitive     CEFAZOLIN <=4 Not Reportable     CEFTAZIDIME <=1 Sensitive     CEFEPIME <=1 Sensitive     CEFTRIAXONE <=1 Sensitive     CIPROFLOXACIN <=0.25 Sensitive     LEVOFLOXACIN <=0.12 Sensitive     GENTAMICIN <=1 Sensitive     IMIPENEM <=0.25 Sensitive     PIP/TAZO 64 Intermediate     TOBRAMYCIN <=1 Sensitive     TRIMETH/SULFA* <=20 Sensitive      * For infections other than uncomplicated UTI caused by E. coli, K. pneumoniae or P. mirabilis: Cefazolin is resistant if MIC > or = 8 mcg/mL. (Distinguishing susceptible versus intermediate for isolates with MIC < or = 4 mcg/mL requires additional testing.) Legend: S = Susceptible  I = Intermediate R = Resistant  NS = Not susceptible * = Not tested  NR = Not reported **NN = See antimicrobic comments   Blood culture (routine single)     Status: Abnormal (Preliminary result)   Collection Time: 03/26/22  2:14 AM   Specimen: Blood  Result Value Ref Range Status   MICRO NUMBER: [id number]  Preliminary   SPECIMEN QUALITY: Suboptimal  Preliminary   Source BLOOD 2  Preliminary   STATUS: PRELIMINARY  Preliminary   Result:   Preliminary     Inspection of blood culture bottles indicates that an inadequate volume of blood may have been collected for the detection of sepsis.   ISOLATE 1: Enterobacter aerogenes (AA)  Preliminary    Comment: Klebsiella aerogenes (Enterobacter) from aerobic bottle only   COMMENT: Aerobic and anaerobic bottle received.  Preliminary      Susceptibility   Enterobacter aerogenes - BLOOD CULTURE, NEGATIVE 1    AMOX/CLAVULANIC >=32 Resistant     CEFAZOLIN* >=64 Resistant      * For infections other than uncomplicated UTI caused by E. coli, K. pneumoniae or P. mirabilis: Cefazolin is resistant if MIC > or = 8 mcg/mL. (Distinguishing susceptible versus intermediate for isolates with MIC < or = 4 mcg/mL requires additional testing.)     CEFTAZIDIME <=1 Sensitive     CEFEPIME <=1 Sensitive     CEFTRIAXONE <=1 Sensitive     CIPROFLOXACIN <=0.25 Sensitive     LEVOFLOXACIN <=0.12 Sensitive     GENTAMICIN <=1 Sensitive     PIP/TAZO <=4 Sensitive     TOBRAMYCIN <=1 Sensitive  TRIMETH/SULFA* <=20 Sensitive      * For infections other than uncomplicated UTI caused by E. coli, K. pneumoniae or P. mirabilis: Cefazolin is resistant if MIC > or = 8 mcg/mL. (Distinguishing susceptible versus intermediate for isolates with MIC < or = 4 mcg/mL requires additional testing.) Legend: S = Susceptible  I = Intermediate R = Resistant  NS = Not susceptible * = Not tested  NR = Not reported **NN = See antimicrobic comments     Procedures/Studies: IR PICC PLACEMENT LEFT >5 YRS INC IMG GUIDE  Result Date: 03/30/2022 CLINICAL DATA:  Bacteremia and need to remove indwelling right chest Port-A-Cath. Request to also place a PICC line for further IV access needs. EXAM: 1. LEFT UPPER EXTREMITY PICC LINE PLACEMENT WITH ULTRASOUND AND FLUOROSCOPIC GUIDANCE 2. REMOVAL OF RIGHT CHEST TUNNELED CENTRAL VENOUS PORT-A-CATH FLUOROSCOPY: 1.0 mGy MEDICATIONS: Moderate (conscious) sedation was employed during this procedure. A total  of Versed 2.0 mg and Fentanyl 150 mcg was administered intravenously by radiology nursing. Moderate Sedation Time: 43 minutes. The patient's level of consciousness and vital signs were monitored continuously by radiology nursing throughout the procedure under my direct supervision. PROCEDURE: The patient was advised of the possible risks and complications and agreed to undergo the procedure. The patient was then brought to the angiographic suite for the procedure. A time-out was performed prior to initiating the procedure. The left arm was prepped with chlorhexidine, draped in the usual sterile fashion using maximum barrier technique (cap and mask, sterile gown, sterile gloves, large sterile sheet, hand hygiene and cutaneous antisepsis) and infiltrated locally with 1% Lidocaine. Ultrasound demonstrated patency of the left brachial vein, and this was documented with an image. Under real-time ultrasound guidance, this vein was accessed with a 21 gauge micropuncture needle and image documentation was performed. A 0.018 wire was introduced in to the vein. Over this, a 5.0 [place] single lumen power injectable PICC was advanced to the lower SVC/right atrial junction. Fluoroscopy during the procedure and fluoro spot radiograph confirms appropriate catheter position. The catheter was flushed and covered with a sterile dressing. Catheter length: 43 cm The right chest port site was prepped with chlorhexidine. Local anesthesia was provided with 1% lidocaine. Utilizing sharp and blunt dissection, the subcutaneous chest port and attached catheter were removed in their entirety. The port pocket was irrigated with sterile saline. The wound was closed with subcutaneous 3-0 Monocryl and subcuticular 4-0 Vicryl. Dermabond was applied to the incision. FINDINGS: The port pocket was clean in appearance and not overtly infected. COMPLICATIONS: None IMPRESSION: 1. Placement single-lumen power injectable PICC line via left brachial vein  with catheter tip at SVC/RA junction. 2. Removal of implanted right chest Port-A-Cath utilizing sharp and blunt dissection. The wound was closed. Electronically Signed   By: Aletta Edouard M.D.   On: 03/30/2022 17:21   IR REMOVAL TUN ACCESS W/ PORT W/O FL MOD SED  Result Date: 03/30/2022 CLINICAL DATA:  Bacteremia and need to remove indwelling right chest Port-A-Cath. Request to also place a PICC line for further IV access needs. EXAM: 1. LEFT UPPER EXTREMITY PICC LINE PLACEMENT WITH ULTRASOUND AND FLUOROSCOPIC GUIDANCE 2. REMOVAL OF RIGHT CHEST TUNNELED CENTRAL VENOUS PORT-A-CATH FLUOROSCOPY: 1.0 mGy MEDICATIONS: Moderate (conscious) sedation was employed during this procedure. A total of Versed 2.0 mg and Fentanyl 150 mcg was administered intravenously by radiology nursing. Moderate Sedation Time: 43 minutes. The patient's level of consciousness and vital signs were monitored continuously by radiology nursing throughout the procedure under my direct supervision.  PROCEDURE: The patient was advised of the possible risks and complications and agreed to undergo the procedure. The patient was then brought to the angiographic suite for the procedure. A time-out was performed prior to initiating the procedure. The left arm was prepped with chlorhexidine, draped in the usual sterile fashion using maximum barrier technique (cap and mask, sterile gown, sterile gloves, large sterile sheet, hand hygiene and cutaneous antisepsis) and infiltrated locally with 1% Lidocaine. Ultrasound demonstrated patency of the left brachial vein, and this was documented with an image. Under real-time ultrasound guidance, this vein was accessed with a 21 gauge micropuncture needle and image documentation was performed. A 0.018 wire was introduced in to the vein. Over this, a 5.0 [place] single lumen power injectable PICC was advanced to the lower SVC/right atrial junction. Fluoroscopy during the procedure and fluoro spot radiograph confirms  appropriate catheter position. The catheter was flushed and covered with a sterile dressing. Catheter length: 43 cm The right chest port site was prepped with chlorhexidine. Local anesthesia was provided with 1% lidocaine. Utilizing sharp and blunt dissection, the subcutaneous chest port and attached catheter were removed in their entirety. The port pocket was irrigated with sterile saline. The wound was closed with subcutaneous 3-0 Monocryl and subcuticular 4-0 Vicryl. Dermabond was applied to the incision. FINDINGS: The port pocket was clean in appearance and not overtly infected. COMPLICATIONS: None IMPRESSION: 1. Placement single-lumen power injectable PICC line via left brachial vein with catheter tip at SVC/RA junction. 2. Removal of implanted right chest Port-A-Cath utilizing sharp and blunt dissection. The wound was closed. Electronically Signed   By: Aletta Edouard M.D.   On: 03/30/2022 17:21   DG Chest 2 View  Result Date: 03/28/2022 CLINICAL DATA:  Fever cancer patient EXAM: CHEST - 2 VIEW COMPARISON:  03/22/2022, 01/23/2021, CT chest 04/04/2020, CT abdomen pelvis 07/15/2020 FINDINGS: Right-sided central venous port tip at the cavoatrial region. No acute airspace disease or effusion. Stable cardiomediastinal silhouette with aortic atherosclerosis. No pneumothorax. Clips in the bilateral axilla. IMPRESSION: No active cardiopulmonary disease. Electronically Signed   By: Donavan Foil M.D.   On: 03/28/2022 23:52   IR CV Line Injection  Result Date: 03/23/2022 INDICATION: Port site check. EXAM: ESTABLISHED PATIENT OFFICE VISIT CHIEF COMPLAINT: Please see epic note for full details. HISTORY OF PRESENT ILLNESS: Please see epic note for full details. REVIEW OF SYSTEMS: Please see epic note for full details. PHYSICAL EXAMINATION: Please see epic note for full details. ASSESSMENT AND PLAN: Please see epic note for full details. Electronically Signed   By: Ruthann Cancer M.D.   On: 03/23/2022 16:38   DG  Chest Port 1 View  Result Date: 03/22/2022 CLINICAL DATA:  Port-A-Cath in place EXAM: PORTABLE CHEST 1 VIEW COMPARISON:  Radiograph 01/23/2021 FINDINGS: Left-sided port is been removed. Interval placement of a right chest port with catheter tip overlying the right atrium. Unchanged cardiomediastinal silhouette. There is no focal airspace consolidation. There is no pleural effusion. No pneumothorax. There is no acute osseous abnormality. IMPRESSION: No evidence of acute cardiopulmonary disease. Right chest port catheter tip overlies the right atrium. Electronically Signed   By: Maurine Simmering M.D.   On: 03/22/2022 16:43    Labs: BNP (last 3 results) No results for input(s): "BNP" in the last 8760 hours. Basic Metabolic Panel: Recent Labs  Lab 03/28/22 0127 03/29/22 0608 03/30/22 0500 03/31/22 0447  NA 141 142 142 137  K 3.5 3.4* 3.9 3.8  CL 109 112* 113* 108  CO2 21*  21* 23 23  GLUCOSE 147* 90 128* 115*  BUN _0 CREATININE 1.51* 1.35* 1.01* 0.87  CALCIUM 9.0 8.3* 8.9 8.9  MG  --  2.2  --   --   PHOS  --  4.0  --   --    Liver Function Tests: Recent Labs  Lab 03/28/22 0127 03/29/22 0608  AST 23 21  ALT 35 32  ALKPHOS 87 83  BILITOT 0.3 0.3  PROT 8.3* 7.1  ALBUMIN 4.1 3.4*   No results for input(s): "LIPASE", "AMYLASE" in the last 168 hours. No results for input(s): "AMMONIA" in the last 168 hours. CBC: Recent Labs  Lab 03/28/22 0127 03/29/22 0608 03/30/22 0500 03/31/22 0447  WBC 8.6 8.4 7.0 8.7  NEUTROABS 5.6 5.0  --   --   HGB 10.1* 9.3* 8.6* 8.1*  HCT 33.0* 29.8* 28.8* 27.2*  MCV 78.2* 79.3* 80.0 80.2  PLT 348 311 284 282   Cardiac Enzymes: No results for input(s): "CKTOTAL", "CKMB", "CKMBINDEX", "TROPONINI" in the last 168 hours. BNP: Invalid input(s): "POCBNP" CBG: No results for input(s): "GLUCAP" in the last 168 hours. D-Dimer No results for input(s): "DDIMER" in the last 72 hours. Hgb A1c No results for input(s): "HGBA1C" in the last 72  hours. Lipid Profile No results for input(s): "CHOL", "HDL", "LDLCALC", "TRIG", "CHOLHDL", "LDLDIRECT" in the last 72 hours. Thyroid function studies No results for input(s): "TSH", "T4TOTAL", "T3FREE", "THYROIDAB" in the last 72 hours.  Invalid input(s): "FREET3" Anemia work up No results for input(s): "VITAMINB12", "FOLATE", "FERRITIN", "TIBC", "IRON", "RETICCTPCT" in the last 72 hours. Urinalysis    Component Value Date/Time   COLORURINE STRAW (A) 03/28/2022 2337   APPEARANCEUR CLEAR 03/28/2022 2337   LABSPEC 1.006 03/28/2022 2337   PHURINE 5.0 03/28/2022 2337   GLUCOSEU NEGATIVE 03/28/2022 2337   HGBUR NEGATIVE 03/28/2022 2337   BILIRUBINUR NEGATIVE 03/28/2022 2337   KETONESUR NEGATIVE 03/28/2022 2337   PROTEINUR NEGATIVE 03/28/2022 2337   UROBILINOGEN 1.0 06/16/2015 1045   NITRITE NEGATIVE 03/28/2022 2337   LEUKOCYTESUR NEGATIVE 03/28/2022 2337   Sepsis Labs Recent Labs  Lab 03/28/22 0127 03/29/22 0608 03/30/22 0500 03/31/22 0447  WBC 8.6 8.4 7.0 8.7   Microbiology Recent Results (from the past 240 hour(s))  Blood culture (routine single)     Status: Abnormal   Collection Time: 03/26/22  2:14 AM   Specimen: Blood  Result Value Ref Range Status   MICRO NUMBER: [id number]  Final   SPECIMEN QUALITY: Adequate  Final   Source BLOOD 1  Final   STATUS: FINAL  Final   ISOLATE 1: Enterobacter aerogenes (AA)  Final    Comment: Klebsiella aerogenes (Enterobacter) , from anaerobic bottle only   ISOLATE 2: Pantoea agglomerans (AA)  Final    Comment: Pantoea agglomerans from aerobic bottle only   COMMENT: Aerobic and anaerobic bottle received.  Final      Susceptibility   Enterobacter aerogenes - BLOOD CULTURE, NEGATIVE 1    AMOX/CLAVULANIC >=32 Resistant     CEFAZOLIN* >=64 Resistant      * For infections other than uncomplicated UTI caused by E. coli, K. pneumoniae or P. mirabilis: Cefazolin is resistant if MIC > or = 8 mcg/mL. (Distinguishing susceptible versus  intermediate for isolates with MIC < or = 4 mcg/mL requires additional testing.)     CEFTAZIDIME <=1 Sensitive     CEFEPIME <=1 Sensitive     CEFTRIAXONE <=1 Sensitive     CIPROFLOXACIN <=0.25 Sensitive  LEVOFLOXACIN <=0.12 Sensitive     GENTAMICIN <=1 Sensitive     IMIPENEM 1 Sensitive     PIP/TAZO <=4 Sensitive     TOBRAMYCIN <=1 Sensitive     TRIMETH/SULFA <=20 Sensitive    Pantoea agglomerans - BLOOD CULTURE, NEGATIVE 2    AMOX/CLAVULANIC 4 Sensitive     CEFAZOLIN <=4 Not Reportable     CEFTAZIDIME <=1 Sensitive     CEFEPIME <=1 Sensitive     CEFTRIAXONE <=1 Sensitive     CIPROFLOXACIN <=0.25 Sensitive     LEVOFLOXACIN <=0.12 Sensitive     GENTAMICIN <=1 Sensitive     IMIPENEM <=0.25 Sensitive     PIP/TAZO 64 Intermediate     TOBRAMYCIN <=1 Sensitive     TRIMETH/SULFA* <=20 Sensitive      * For infections other than uncomplicated UTI caused by E. coli, K. pneumoniae or P. mirabilis: Cefazolin is resistant if MIC > or = 8 mcg/mL. (Distinguishing susceptible versus intermediate for isolates with MIC < or = 4 mcg/mL requires additional testing.) Legend: S = Susceptible  I = Intermediate R = Resistant  NS = Not susceptible * = Not tested  NR = Not reported **NN = See antimicrobic comments   Blood culture (routine single)     Status: Abnormal (Preliminary result)   Collection Time: 03/26/22  2:14 AM   Specimen: Blood  Result Value Ref Range Status   MICRO NUMBER: [id number]  Preliminary   SPECIMEN QUALITY: Suboptimal  Preliminary   Source BLOOD 2  Preliminary   STATUS: PRELIMINARY  Preliminary   Result:   Preliminary    Inspection of blood culture bottles indicates that an inadequate volume of blood may have been collected for the detection of sepsis.   ISOLATE 1: Enterobacter aerogenes (AA)  Preliminary    Comment: Klebsiella aerogenes (Enterobacter) from aerobic bottle only   COMMENT: Aerobic and anaerobic bottle received.  Preliminary      Susceptibility    Enterobacter aerogenes - BLOOD CULTURE, NEGATIVE 1    AMOX/CLAVULANIC >=32 Resistant     CEFAZOLIN* >=64 Resistant      * For infections other than uncomplicated UTI caused by E. coli, K. pneumoniae or P. mirabilis: Cefazolin is resistant if MIC > or = 8 mcg/mL. (Distinguishing susceptible versus intermediate for isolates with MIC < or = 4 mcg/mL requires additional testing.)     CEFTAZIDIME <=1 Sensitive     CEFEPIME <=1 Sensitive     CEFTRIAXONE <=1 Sensitive     CIPROFLOXACIN <=0.25 Sensitive     LEVOFLOXACIN <=0.12 Sensitive     GENTAMICIN <=1 Sensitive     PIP/TAZO <=4 Sensitive     TOBRAMYCIN <=1 Sensitive     TRIMETH/SULFA* <=20 Sensitive      * For infections other than uncomplicated UTI caused by E. coli, K. pneumoniae or P. mirabilis: Cefazolin is resistant if MIC > or = 8 mcg/mL. (Distinguishing susceptible versus intermediate for isolates with MIC < or = 4 mcg/mL requires additional testing.) Legend: S = Susceptible  I = Intermediate R = Resistant  NS = Not susceptible * = Not tested  NR = Not reported **NN = See antimicrobic comments    Time coordinating discharge: 25 minutes  SIGNED: Antonieta Pert, MD  [facility] 03/31/2022, 11:38 AM  If 7PM-7AM, please contact night-coverage [email or web address]

## 2022-04-01 LAB — CULTURE, BLOOD (SINGLE): MICRO NUMBER:: 13573056

## 2022-04-05 ENCOUNTER — Other Ambulatory Visit: Payer: Self-pay

## 2022-04-05 ENCOUNTER — Emergency Department (HOSPITAL_COMMUNITY): Payer: BC Managed Care – PPO

## 2022-04-05 ENCOUNTER — Encounter (HOSPITAL_COMMUNITY): Payer: Self-pay | Admitting: Oncology

## 2022-04-05 ENCOUNTER — Inpatient Hospital Stay (HOSPITAL_COMMUNITY)
Admission: EM | Admit: 2022-04-05 | Discharge: 2022-04-09 | DRG: 314 | Disposition: A | Discharge status: Home / Self Care | Payer: BC Managed Care – PPO | Attending: Internal Medicine | Admitting: Internal Medicine

## 2022-04-05 ENCOUNTER — Telehealth: Payer: Self-pay

## 2022-04-05 DIAGNOSIS — Z7982 Long term (current) use of aspirin: Secondary | ICD-10-CM

## 2022-04-05 DIAGNOSIS — J45909 Unspecified asthma, uncomplicated: Secondary | ICD-10-CM | POA: Diagnosis present

## 2022-04-05 DIAGNOSIS — Z452 Encounter for adjustment and management of vascular access device: Secondary | ICD-10-CM | POA: Diagnosis not present

## 2022-04-05 DIAGNOSIS — K219 Gastro-esophageal reflux disease without esophagitis: Secondary | ICD-10-CM | POA: Diagnosis not present

## 2022-04-05 DIAGNOSIS — G4730 Sleep apnea, unspecified: Secondary | ICD-10-CM | POA: Diagnosis present

## 2022-04-05 DIAGNOSIS — Z89021 Acquired absence of right finger(s): Secondary | ICD-10-CM

## 2022-04-05 DIAGNOSIS — T80211A Bloodstream infection due to central venous catheter, initial encounter: Secondary | ICD-10-CM | POA: Diagnosis not present

## 2022-04-05 DIAGNOSIS — F32A Depression, unspecified: Secondary | ICD-10-CM | POA: Diagnosis not present

## 2022-04-05 DIAGNOSIS — Z885 Allergy status to narcotic agent status: Secondary | ICD-10-CM

## 2022-04-05 DIAGNOSIS — Z9882 Breast implant status: Secondary | ICD-10-CM

## 2022-04-05 DIAGNOSIS — Z90710 Acquired absence of both cervix and uterus: Secondary | ICD-10-CM | POA: Diagnosis not present

## 2022-04-05 DIAGNOSIS — Z79890 Hormone replacement therapy (postmenopausal): Secondary | ICD-10-CM | POA: Diagnosis not present

## 2022-04-05 DIAGNOSIS — F419 Anxiety disorder, unspecified: Secondary | ICD-10-CM

## 2022-04-05 DIAGNOSIS — N179 Acute kidney failure, unspecified: Secondary | ICD-10-CM | POA: Diagnosis present

## 2022-04-05 DIAGNOSIS — M810 Age-related osteoporosis without current pathological fracture: Secondary | ICD-10-CM | POA: Diagnosis present

## 2022-04-05 DIAGNOSIS — R509 Fever, unspecified: Secondary | ICD-10-CM | POA: Diagnosis present

## 2022-04-05 DIAGNOSIS — E785 Hyperlipidemia, unspecified: Secondary | ICD-10-CM | POA: Diagnosis present

## 2022-04-05 DIAGNOSIS — A419 Sepsis, unspecified organism: Secondary | ICD-10-CM | POA: Diagnosis present

## 2022-04-05 DIAGNOSIS — E890 Postprocedural hypothyroidism: Secondary | ICD-10-CM | POA: Diagnosis not present

## 2022-04-05 DIAGNOSIS — F329 Major depressive disorder, single episode, unspecified: Secondary | ICD-10-CM | POA: Diagnosis not present

## 2022-04-05 DIAGNOSIS — Z853 Personal history of malignant neoplasm of breast: Secondary | ICD-10-CM | POA: Diagnosis not present

## 2022-04-05 DIAGNOSIS — J9611 Chronic respiratory failure with hypoxia: Secondary | ICD-10-CM | POA: Diagnosis present

## 2022-04-05 DIAGNOSIS — G43909 Migraine, unspecified, not intractable, without status migrainosus: Secondary | ICD-10-CM | POA: Diagnosis not present

## 2022-04-05 DIAGNOSIS — E271 Primary adrenocortical insufficiency: Secondary | ICD-10-CM | POA: Diagnosis present

## 2022-04-05 DIAGNOSIS — Z9981 Dependence on supplemental oxygen: Secondary | ICD-10-CM | POA: Diagnosis not present

## 2022-04-05 DIAGNOSIS — Z888 Allergy status to other drugs, medicaments and biological substances status: Secondary | ICD-10-CM

## 2022-04-05 DIAGNOSIS — D649 Anemia, unspecified: Secondary | ICD-10-CM | POA: Diagnosis present

## 2022-04-05 DIAGNOSIS — R7881 Bacteremia: Secondary | ICD-10-CM | POA: Diagnosis not present

## 2022-04-05 DIAGNOSIS — Z9049 Acquired absence of other specified parts of digestive tract: Secondary | ICD-10-CM | POA: Diagnosis not present

## 2022-04-05 DIAGNOSIS — I10 Essential (primary) hypertension: Secondary | ICD-10-CM | POA: Diagnosis present

## 2022-04-05 DIAGNOSIS — Z66 Do not resuscitate: Secondary | ICD-10-CM | POA: Diagnosis present

## 2022-04-05 DIAGNOSIS — D72829 Elevated white blood cell count, unspecified: Secondary | ICD-10-CM | POA: Diagnosis not present

## 2022-04-05 DIAGNOSIS — I110 Hypertensive heart disease with heart failure: Secondary | ICD-10-CM | POA: Diagnosis not present

## 2022-04-05 DIAGNOSIS — E782 Mixed hyperlipidemia: Secondary | ICD-10-CM | POA: Diagnosis not present

## 2022-04-05 DIAGNOSIS — Z79899 Other long term (current) drug therapy: Secondary | ICD-10-CM | POA: Diagnosis not present

## 2022-04-05 DIAGNOSIS — Z8541 Personal history of malignant neoplasm of cervix uteri: Secondary | ICD-10-CM

## 2022-04-05 DIAGNOSIS — Z9102 Food additives allergy status: Secondary | ICD-10-CM

## 2022-04-05 DIAGNOSIS — R109 Unspecified abdominal pain: Secondary | ICD-10-CM | POA: Diagnosis not present

## 2022-04-05 DIAGNOSIS — Z8571 Personal history of Hodgkin lymphoma: Secondary | ICD-10-CM

## 2022-04-05 DIAGNOSIS — I5032 Chronic diastolic (congestive) heart failure: Secondary | ICD-10-CM | POA: Diagnosis not present

## 2022-04-05 DIAGNOSIS — K7689 Other specified diseases of liver: Secondary | ICD-10-CM | POA: Diagnosis not present

## 2022-04-05 DIAGNOSIS — R52 Pain, unspecified: Secondary | ICD-10-CM | POA: Diagnosis not present

## 2022-04-05 DIAGNOSIS — Z9013 Acquired absence of bilateral breasts and nipples: Secondary | ICD-10-CM

## 2022-04-05 DIAGNOSIS — J189 Pneumonia, unspecified organism: Secondary | ICD-10-CM | POA: Diagnosis present

## 2022-04-05 DIAGNOSIS — R6520 Severe sepsis without septic shock: Secondary | ICD-10-CM | POA: Diagnosis not present

## 2022-04-05 DIAGNOSIS — Z923 Personal history of irradiation: Secondary | ICD-10-CM

## 2022-04-05 DIAGNOSIS — B9689 Other specified bacterial agents as the cause of diseases classified elsewhere: Secondary | ICD-10-CM | POA: Diagnosis not present

## 2022-04-05 DIAGNOSIS — Z85850 Personal history of malignant neoplasm of thyroid: Secondary | ICD-10-CM

## 2022-04-05 DIAGNOSIS — Z9221 Personal history of antineoplastic chemotherapy: Secondary | ICD-10-CM

## 2022-04-05 LAB — COMPREHENSIVE METABOLIC PANEL
ALT: 43 U/L (ref 0–44)
AST: 29 U/L (ref 15–41)
Albumin: 4 g/dL (ref 3.5–5.0)
Alkaline Phosphatase: 86 U/L (ref 38–126)
Anion gap: 10 (ref 5–15)
BUN: 34 mg/dL — ABNORMAL HIGH (ref 6–20)
CO2: 21 mmol/L — ABNORMAL LOW (ref 22–32)
Calcium: 9.4 mg/dL (ref 8.9–10.3)
Chloride: 106 mmol/L (ref 98–111)
Creatinine, Ser: 1.53 mg/dL — ABNORMAL HIGH (ref 0.44–1.00)
GFR, Estimated: 41 mL/min — ABNORMAL LOW (ref >60)
Glucose, Bld: 111 mg/dL — ABNORMAL HIGH (ref 70–99)
Potassium: 4 mmol/L (ref 3.5–5.1)
Sodium: 137 mmol/L (ref 135–145)
Total Bilirubin: 0.6 mg/dL (ref 0.3–1.2)
Total Protein: 8.3 g/dL — ABNORMAL HIGH (ref 6.5–8.1)

## 2022-04-05 LAB — CBC WITH DIFFERENTIAL/PLATELET
Abs Immature Granulocytes: 0.07 10*3/uL (ref 0.00–0.07)
Basophils Absolute: 0.1 10*3/uL (ref 0.0–0.1)
Basophils Relative: 0 %
Eosinophils Absolute: 0 10*3/uL (ref 0.0–0.5)
Eosinophils Relative: 0 %
HCT: 34.9 % — ABNORMAL LOW (ref 36.0–46.0)
Hemoglobin: 10.7 g/dL — ABNORMAL LOW (ref 12.0–15.0)
Immature Granulocytes: 1 %
Lymphocytes Relative: 8 %
Lymphs Abs: 1.2 10*3/uL (ref 0.7–4.0)
MCH: 23.7 pg — ABNORMAL LOW (ref 26.0–34.0)
MCHC: 30.7 g/dL (ref 30.0–36.0)
MCV: 77.2 fL — ABNORMAL LOW (ref 80.0–100.0)
Monocytes Absolute: 1 10*3/uL (ref 0.1–1.0)
Monocytes Relative: 7 %
Neutro Abs: 12.8 10*3/uL — ABNORMAL HIGH (ref 1.7–7.7)
Neutrophils Relative %: 84 %
Platelets: 378 10*3/uL (ref 150–400)
RBC: 4.52 MIL/uL (ref 3.87–5.11)
RDW: 17.3 % — ABNORMAL HIGH (ref 11.5–15.5)
WBC: 15.2 10*3/uL — ABNORMAL HIGH (ref 4.0–10.5)
nRBC: 0 % (ref 0.0–0.2)

## 2022-04-05 LAB — URINALYSIS, ROUTINE W REFLEX MICROSCOPIC
Bacteria, UA: NONE SEEN
Bilirubin Urine: NEGATIVE
Glucose, UA: NEGATIVE mg/dL
Hgb urine dipstick: NEGATIVE
Ketones, ur: NEGATIVE mg/dL
Leukocytes,Ua: NEGATIVE
Nitrite: NEGATIVE
Protein, ur: 30 mg/dL — AB
Specific Gravity, Urine: 1.018 (ref 1.005–1.030)
pH: 5 (ref 5.0–8.0)

## 2022-04-05 LAB — LACTIC ACID, PLASMA: Lactic Acid, Venous: 1.3 mmol/L (ref 0.5–1.9)

## 2022-04-05 LAB — PROTIME-INR
INR: 1.1 (ref 0.8–1.2)
Prothrombin Time: 14.1 seconds (ref 11.4–15.2)

## 2022-04-05 MED ORDER — METOPROLOL SUCCINATE ER 25 MG PO TB24
37.5000 mg | ORAL_TABLET | Freq: Every day | ORAL | Status: DC
  Administered 2022-04-06 – 2022-04-08 (×3): 37.5 mg via ORAL
  Filled 2022-04-05 (×4): qty 2

## 2022-04-05 MED ORDER — FLUCONAZOLE 150 MG PO TABS: 150.0000 mg | ORAL_TABLET | Freq: Every day | ORAL | Status: DC

## 2022-04-05 MED ORDER — LACTATED RINGERS IV BOLUS (SEPSIS): 1000.0000 mL | Freq: Once | INTRAVENOUS | Status: DC

## 2022-04-05 MED ORDER — SODIUM CHLORIDE 0.9 % IV SOLN
2.0000 g | Freq: Once | INTRAVENOUS | Status: AC
  Administered 2022-04-05: 2 g via INTRAVENOUS
  Filled 2022-04-05: qty 12.5

## 2022-04-05 MED ORDER — MAGNESIUM HYDROXIDE 400 MG/5ML PO SUSP: 30.0000 mL | Freq: Every day | ORAL | Status: DC | PRN

## 2022-04-05 MED ORDER — LORAZEPAM 2 MG/ML IJ SOLN
0.5000 mg | Freq: Once | INTRAMUSCULAR | Status: AC
  Administered 2022-04-05: 0.5 mg via INTRAVENOUS
  Filled 2022-04-05: qty 1

## 2022-04-05 MED ORDER — VANCOMYCIN HCL 1500 MG/300ML IV SOLN
1500.0000 mg | Freq: Once | INTRAVENOUS | Status: AC
  Administered 2022-04-05: 1500 mg via INTRAVENOUS
  Filled 2022-04-05: qty 300

## 2022-04-05 MED ORDER — EPINEPHRINE 0.3 MG/0.3ML IJ SOAJ
0.3000 mg | INTRAMUSCULAR | Status: DC | PRN
Start: 2022-04-05 — End: 2022-04-09

## 2022-04-05 MED ORDER — TRAZODONE HCL 50 MG PO TABS: 25.0000 mg | ORAL_TABLET | Freq: Every evening | ORAL | Status: DC | PRN

## 2022-04-05 MED ORDER — BUPROPION HCL ER (XL) 150 MG PO TB24
150.0000 mg | ORAL_TABLET | Freq: Every day | ORAL | Status: DC
  Administered 2022-04-06 – 2022-04-09 (×4): 150 mg via ORAL
  Filled 2022-04-05 (×4): qty 1

## 2022-04-05 MED ORDER — POTASSIUM CHLORIDE ER 10 MEQ PO TBCR: 10.0000 meq | EXTENDED_RELEASE_TABLET | Freq: Every day | ORAL | Status: DC

## 2022-04-05 MED ORDER — LACTATED RINGERS IV SOLN: INTRAVENOUS | Status: DC

## 2022-04-05 MED ORDER — DIPHENHYDRAMINE HCL 50 MG/ML IJ SOLN
50.0000 mg | Freq: Four times a day (QID) | INTRAMUSCULAR | Status: DC | PRN
  Administered 2022-04-05 – 2022-04-09 (×14): 50 mg via INTRAVENOUS
  Filled 2022-04-05 (×14): qty 1

## 2022-04-05 MED ORDER — SODIUM CHLORIDE 0.9 % IV BOLUS (SEPSIS)
1000.0000 mL | Freq: Once | INTRAVENOUS | Status: AC
  Administered 2022-04-05: 1000 mL via INTRAVENOUS

## 2022-04-05 MED ORDER — LAMOTRIGINE 100 MG PO TABS
100.0000 mg | ORAL_TABLET | Freq: Every day | ORAL | Status: DC
Start: 2022-04-05 — End: 2022-04-09
  Administered 2022-04-05 – 2022-04-08 (×4): 100 mg via ORAL
  Filled 2022-04-05 (×4): qty 1

## 2022-04-05 MED ORDER — BUMETANIDE 1 MG PO TABS
1.0000 mg | ORAL_TABLET | Freq: Two times a day (BID) | ORAL | Status: DC
  Administered 2022-04-06 (×2): 1 mg via ORAL
  Filled 2022-04-05 (×3): qty 1

## 2022-04-05 MED ORDER — GALCANEZUMAB-GNLM 120 MG/ML ~~LOC~~ SOAJ: 120.0000 mg | SUBCUTANEOUS | Status: DC

## 2022-04-05 MED ORDER — ALBUTEROL SULFATE (2.5 MG/3ML) 0.083% IN NEBU
2.5000 mg | INHALATION_SOLUTION | Freq: Four times a day (QID) | RESPIRATORY_TRACT | Status: DC | PRN
Start: 2022-04-05 — End: 2022-04-09

## 2022-04-05 MED ORDER — SODIUM CHLORIDE 0.9 % IV SOLN: 2.0000 g | Freq: Once | INTRAVENOUS | Status: DC

## 2022-04-05 MED ORDER — SODIUM CHLORIDE 0.9 % IV BOLUS (SEPSIS)
500.0000 mL | Freq: Once | INTRAVENOUS | Status: AC
  Administered 2022-04-05: 500 mL via INTRAVENOUS

## 2022-04-05 MED ORDER — ALPRAZOLAM 0.5 MG PO TABS
0.5000 mg | ORAL_TABLET | Freq: Two times a day (BID) | ORAL | Status: DC | PRN
  Administered 2022-04-06 – 2022-04-09 (×6): 0.5 mg via ORAL
  Filled 2022-04-05 (×6): qty 1

## 2022-04-05 MED ORDER — PANTOPRAZOLE SODIUM 40 MG PO TBEC: 40.0000 mg | DELAYED_RELEASE_TABLET | Freq: Every day | ORAL | Status: DC

## 2022-04-05 MED ORDER — DICLOFENAC POTASSIUM(MIGRAINE) 50 MG PO PACK: 50.0000 mg | PACK | ORAL | Status: DC | PRN

## 2022-04-05 MED ORDER — SODIUM CHLORIDE 0.9 % IV SOLN
INTRAVENOUS | Status: DC
Start: 2022-04-05 — End: 2022-04-06

## 2022-04-05 MED ORDER — ACETAMINOPHEN 325 MG PO TABS
650.0000 mg | ORAL_TABLET | Freq: Four times a day (QID) | ORAL | Status: DC | PRN
  Filled 2022-04-05: qty 2

## 2022-04-05 MED ORDER — LINAGLIPTIN 5 MG PO TABS
5.0000 mg | ORAL_TABLET | Freq: Every day | ORAL | Status: DC
  Administered 2022-04-06 – 2022-04-09 (×2): 5 mg via ORAL
  Filled 2022-04-05 (×4): qty 1

## 2022-04-05 MED ORDER — METHYLPREDNISOLONE SODIUM SUCC 40 MG IJ SOLR
40.0000 mg | Freq: Once | INTRAMUSCULAR | Status: AC
  Administered 2022-04-05: 40 mg via INTRAVENOUS
  Filled 2022-04-05: qty 1

## 2022-04-05 MED ORDER — ENOXAPARIN SODIUM 40 MG/0.4ML IJ SOSY
40.0000 mg | PREFILLED_SYRINGE | INTRAMUSCULAR | Status: DC
  Filled 2022-04-05 (×3): qty 0.4

## 2022-04-05 MED ORDER — TOPIRAMATE 100 MG PO TABS
150.0000 mg | ORAL_TABLET | Freq: Every day | ORAL | Status: DC
  Administered 2022-04-05 – 2022-04-08 (×4): 150 mg via ORAL
  Filled 2022-04-05 (×4): qty 2

## 2022-04-05 MED ORDER — METRONIDAZOLE 500 MG/100ML IV SOLN
500.0000 mg | Freq: Two times a day (BID) | INTRAVENOUS | Status: DC
  Administered 2022-04-06 – 2022-04-09 (×8): 500 mg via INTRAVENOUS
  Filled 2022-04-05 (×8): qty 100

## 2022-04-05 MED ORDER — HYDROCORTISONE 10 MG PO TABS
10.0000 mg | ORAL_TABLET | Freq: Every evening | ORAL | Status: DC
  Administered 2022-04-05 – 2022-04-08 (×4): 10 mg via ORAL
  Filled 2022-04-05 (×5): qty 1

## 2022-04-05 MED ORDER — SACCHAROMYCES BOULARDII 250 MG PO CAPS
250.0000 mg | ORAL_CAPSULE | Freq: Two times a day (BID) | ORAL | Status: DC
  Administered 2022-04-06 – 2022-04-09 (×7): 250 mg via ORAL
  Filled 2022-04-05 (×7): qty 1

## 2022-04-05 MED ORDER — ACETAMINOPHEN 650 MG RE SUPP: 650.0000 mg | Freq: Four times a day (QID) | RECTAL | Status: DC | PRN

## 2022-04-05 MED ORDER — ROSUVASTATIN CALCIUM 10 MG PO TABS
10.0000 mg | ORAL_TABLET | Freq: Every day | ORAL | Status: DC
  Administered 2022-04-06 – 2022-04-09 (×4): 10 mg via ORAL
  Filled 2022-04-05 (×4): qty 1

## 2022-04-05 MED ORDER — ESCITALOPRAM OXALATE 20 MG PO TABS
20.0000 mg | ORAL_TABLET | Freq: Every evening | ORAL | Status: DC
  Administered 2022-04-05 – 2022-04-08 (×4): 20 mg via ORAL
  Filled 2022-04-05 (×4): qty 1

## 2022-04-05 MED ORDER — ZOLPIDEM TARTRATE 5 MG PO TABS
5.0000 mg | ORAL_TABLET | Freq: Every evening | ORAL | Status: DC | PRN
  Administered 2022-04-06 – 2022-04-08 (×3): 5 mg via ORAL
  Filled 2022-04-05 (×3): qty 1

## 2022-04-05 MED ORDER — HYDROCORTISONE 10 MG PO TABS
20.0000 mg | ORAL_TABLET | Freq: Every day | ORAL | Status: DC
  Administered 2022-04-06 – 2022-04-09 (×4): 20 mg via ORAL
  Filled 2022-04-05 (×4): qty 2

## 2022-04-05 MED ORDER — DRONABINOL 2.5 MG PO CAPS
2.5000 mg | ORAL_CAPSULE | Freq: Two times a day (BID) | ORAL | Status: DC | PRN
  Administered 2022-04-08: 2.5 mg via ORAL
  Filled 2022-04-05: qty 1

## 2022-04-05 MED ORDER — LACTATED RINGERS IV BOLUS (SEPSIS): 500.0000 mL | Freq: Once | INTRAVENOUS | Status: DC

## 2022-04-05 MED ORDER — ALBUTEROL SULFATE HFA 108 (90 BASE) MCG/ACT IN AERS: 2.0000 | INHALATION_SPRAY | Freq: Four times a day (QID) | RESPIRATORY_TRACT | Status: DC | PRN

## 2022-04-05 MED ORDER — SUCRALFATE 1 G PO TABS: 1.0000 g | ORAL_TABLET | Freq: Every day | ORAL | Status: DC | PRN

## 2022-04-05 MED ORDER — MOMETASONE FURO-FORMOTEROL FUM 200-5 MCG/ACT IN AERO
2.0000 | INHALATION_SPRAY | Freq: Two times a day (BID) | RESPIRATORY_TRACT | Status: DC
  Administered 2022-04-06 – 2022-04-09 (×7): 2 via RESPIRATORY_TRACT
  Filled 2022-04-05: qty 8.8

## 2022-04-05 MED ORDER — VANCOMYCIN HCL IN DEXTROSE 1-5 GM/200ML-% IV SOLN: 1000.0000 mg | Freq: Once | INTRAVENOUS | Status: DC

## 2022-04-05 MED ORDER — LEVOTHYROXINE SODIUM 100 MCG PO TABS
100.0000 ug | ORAL_TABLET | Freq: Every morning | ORAL | Status: DC
Start: 2022-04-06 — End: 2022-04-09
  Administered 2022-04-06 – 2022-04-09 (×4): 100 ug via ORAL
  Filled 2022-04-05 (×4): qty 1

## 2022-04-05 MED ORDER — DICLOFENAC SODIUM 50 MG PO TBEC
50.0000 mg | DELAYED_RELEASE_TABLET | Freq: Every day | ORAL | Status: DC | PRN
  Administered 2022-04-07: 50 mg via ORAL
  Filled 2022-04-05 (×2): qty 1

## 2022-04-05 MED ORDER — ASPIRIN 325 MG PO TABS
325.0000 mg | ORAL_TABLET | Freq: Every day | ORAL | Status: DC
  Administered 2022-04-05 – 2022-04-08 (×4): 325 mg via ORAL
  Filled 2022-04-05 (×4): qty 1

## 2022-04-05 MED ORDER — ACETAMINOPHEN 325 MG PO TABS
650.0000 mg | ORAL_TABLET | Freq: Once | ORAL | Status: AC | PRN
  Administered 2022-04-05: 650 mg via ORAL
  Filled 2022-04-05: qty 2

## 2022-04-05 MED ORDER — RIMEGEPANT SULFATE 75 MG PO TBDP: 75.0000 mg | ORAL_TABLET | Freq: Every day | ORAL | Status: DC | PRN

## 2022-04-05 NOTE — ED Provider Notes (Signed)
[facility] DEPT Provider Note   CSN: [id number] Arrival date & time: 04/05/22  1753     History  Chief Complaint  Patient presents with   Fever    [name] is a 51 y.o. female. breast cancer status post bilateral mastectomy, not currently on chemotherapy, NHL,W/ Port-A-Cath, Addison's disease, who recently grew out gram-negative bacilli on blood culture drawn on 03/26/2022.  Admitted, discharged on oral Levaquin per ID.  Patient reports she has been compliant with the Levaquin and has not missed any doses.  She states that today she started having a fever.  Chills.  No other new symptoms today.  No new cough, no dysuria, no abdominal pain.  Has had some nausea with occasional vomiting.  HPI     Home Medications Prior to Admission medications   Medication Sig Start Date End Date Taking? Authorizing Provider  albuterol (PROVENTIL) (2.5 MG/3ML) 0.083% nebulizer solution Take 3 mLs (2.5 mg total) by nebulization every 6 (six) hours as needed for wheezing or shortness of breath. 03/27/21 06/16/21  Magdalen Spatz, NP  albuterol (VENTOLIN HFA) 108 (90 Base) MCG/ACT inhaler Inhale 2 puffs into the lungs every 6 (six) hours as needed for wheezing or shortness of breath.    [provider]  ALPRAZolam Duanne Moron) 0.5 MG tablet Take 1 tablet (0.5 mg total) by mouth 2 (two) times daily as needed for anxiety. 02/15/22   Mozingo, Berdie Ogren, NP  aspirin 325 MG tablet Take 325 mg by mouth at bedtime.     [provider]  bumetanide (BUMEX) 1 MG tablet TAKE 1 TABLET BY MOUTH TWICE A DAY Patient taking differently: Take 1 mg by mouth 2 (two) times daily. 07/17/21   Nicholas Lose, MD  buPROPion (WELLBUTRIN XL) 150 MG 24 hr tablet Take 1 tablet (150 mg total) by mouth daily. 03/27/22   Mozingo, Berdie Ogren, NP  dexlansoprazole (DEXILANT) 60 MG capsule Take 60 mg by mouth daily.    [provider]  Diclofenac Potassium,Migraine, (CAMBIA) 50  MG PACK Take 1 packet by mouth as needed for migraine. 05/16/21   [provider]  diphenhydrAMINE (BENADRYL) 50 MG/ML injection Inject 1 mL (50 mg total) into the vein every 6 (six) hours as needed (nausea). 12/25/18   Rai, Ripudeep K, MD  dronabinol (MARINOL) 2.5 MG capsule TAKE 1 CAPSULE BY MOUTH TWICE DAILY AS NEEDED Patient taking differently: Take 2.5 mg by mouth 2 (two) times daily as needed (nausea). 12/26/21   Nicholas Lose, MD  EPINEPHrine 0.3 mg/0.3 mL IJ SOAJ injection Inject 0.3 mg into the muscle as needed for anaphylaxis. 06/13/21   Margaretha Seeds, MD  escitalopram (LEXAPRO) 20 MG tablet Take 1 tablet (20 mg total) by mouth every evening. 11/08/21   Mozingo, Berdie Ogren, NP  fluconazole (DIFLUCAN) 150 MG tablet Take 1 tablet (150 mg total) by mouth daily. If needed for yeast infection Patient not taking: Reported on 03/02/2022 11/24/21   Carlyle Basques, MD  fluticasone-salmeterol (ADVAIR HFA) [phone] MCG/ACT inhaler Inhale 2 puffs into the lungs 2 (two) times daily. 02/02/22   Margaretha Seeds, MD  Galcanezumab-gnlm 120 MG/ML SOAJ Inject 120 mg into the skin every 28 (twenty-eight) days.  03/31/19   [provider]  heparin lock flush 100 UNIT/ML SOLN injection USE 5 MLS AS A HEPLOCK IN PORT A CATH ONCE DAILY AFTER MEDICATION ADMINISTRATION OR AS DIRECTED BY PHYSICIAN. 10/30/21 10/30/22  Nicholas Lose, MD  hydrocortisone (CORTEF) 10 MG tablet Take 1-2 tablets (  10-20 mg total) by mouth See admin instructions. Take 20 mg in the am and 64m in the evening Patient taking differently: Take 10-20 mg by mouth See admin instructions. Take 20 mg by mouth in the morning and 147min the evening 02/17/20   KyCherylann Ratel, DO  lamoTRIgine (LAMICTAL) 100 MG tablet Take one tablet at bedtime. 03/23/22   Mozingo, ReBerdie OgrenNP  Lancets ([facility]ICA PLUS [id number] 3 (three) times daily. for testing 06/12/21   [provider]  levofloxacin (LEVAQUIN) 750 MG tablet Take 1  tablet (750 mg total) by mouth daily for 10 days. 03/31/22 04/10/22  KcAntonieta PertMD  metoprolol succinate (TOPROL-XL) 25 MG 24 hr tablet Take 37.5 mg by mouth daily.     [provider]  [facility] test strip 3 (three) times daily. for testing 06/12/21   [provider]  OXYGEN Inhale 3-4 L into the lungs continuous.    [provider]  potassium chloride (KLOR-CON) 10 MEQ tablet Take 1 tablet (10 mEq total) by mouth daily for 21 days. 07/15/20 08/05/20  PaAlfredia ClientPA-C  predniSONE (DELTASONE) 10 MG tablet Take 4 tabs x 2 days, then 3 x 2d, then 2 x 2d, then 1 x 2d and STOP Patient not taking: Reported on 03/29/2022 03/27/21   GrMagdalen SpatzNP  Rimegepant Sulfate (NURTEC) 75 MG TBDP Take 75 mg by mouth daily as needed (Migraine).     [provider]  rosuvastatin (CRESTOR) 10 MG tablet Take 10 mg by mouth daily.  02/28/18   [provider]  saccharomyces boulardii (FLORASTOR) 250 MG capsule Take 1 capsule (250 mg total) by mouth 2 (two) times daily for 14 days. 03/31/22 04/14/22  KcAntonieta PertMD  sitaGLIPtin (JANUVIA) 100 MG tablet Take 1 tablet (100 mg total) by mouth daily. 10/19/21   GuNicholas LoseMD  Sodium Chloride Flush (NORMAL SALINE FLUSH) 0.9 % SOLN FLUSH WITH TWO 10 ML FLUSHES IN PORT BEFORE AND AFTER FLUIDS AND MEDICATION ADMINISTRATION, TO MAINTAIN PATENCY--USE UP TO 4 TIMES DAILY. 10/30/21 10/30/22  GuNicholas LoseMD  sucralfate (CARAFATE) 1 g tablet Take 1 g by mouth daily as needed (FOR ULCERS).     [provider]  SYNTHROID 100 MCG tablet Take 1 tablet (100 mcg total) by mouth every morning. 10/19/21   GuNicholas LoseMD  topiramate (TOPAMAX) 50 MG tablet Take 150 mg by mouth at bedtime.  12/25/19   [provider]  zolpidem (AMBIEN CR) 12.5 MG CR tablet Take 12.5 mg by mouth at bedtime as needed. 03/05/22   [provider]      Allergies    Ferrous bisglycinate chelate [iron], Mushroom extract complex, Na ferric gluc  cplx in sucrose, Cymbalta [duloxetine hcl], Phenergan [promethazine], Promethazine hcl, Succinylcholine, Buprenorphine hcl, Compazine, Metoclopramide, Morphine and related, Ondansetron, Promethazine hcl, and Tegaderm ag mesh [silver]    Review of Systems   Review of Systems  Constitutional:  Positive for chills and fever.  HENT:  Negative for ear pain and sore throat.   Eyes:  Negative for pain and visual disturbance.  Respiratory:  Negative for cough and shortness of breath.   Cardiovascular:  Negative for chest pain and palpitations.  Gastrointestinal:  Negative for abdominal pain and vomiting.  Genitourinary:  Negative for dysuria and hematuria.  Musculoskeletal:  Negative for arthralgias and back pain.  Skin:  Negative for color change and rash.  Neurological:  Negative for seizures and syncope.  All other systems reviewed  and are negative.   Physical Exam Updated Vital Signs BP 94/61   Pulse 88   Temp (!) 102.1 F (38.9 C) (Oral)   Resp 18   LMP  (LMP Unknown)   SpO2 100%  Physical Exam Vitals and nursing note reviewed.  Constitutional:      General: She is not in acute distress.    Appearance: She is well-developed.  HENT:     Head: Normocephalic and atraumatic.  Eyes:     Conjunctiva/sclera: Conjunctivae normal.  Cardiovascular:     Rate and Rhythm: Normal rate and regular rhythm.     Heart sounds: No murmur heard. Pulmonary:     Effort: Pulmonary effort is normal. No respiratory distress.     Breath sounds: Normal breath sounds.  Abdominal:     Palpations: Abdomen is soft.     Tenderness: There is no abdominal tenderness.  Musculoskeletal:        General: No swelling.     Cervical back: Neck supple.     Comments: Left arm PICC line is intact, no surrounding erythema noted  Skin:    General: Skin is warm and dry.     Capillary Refill: Capillary refill takes less than 2 seconds.  Neurological:     General: No focal deficit present.     Mental Status: She is  alert.  Psychiatric:        Mood and Affect: Mood normal.     ED Results / Procedures / Treatments   Labs (all labs ordered are listed, but only abnormal results are displayed) Labs Reviewed  COMPREHENSIVE METABOLIC PANEL - Abnormal; Notable for the following components:      Result Value   CO2 21 (*)    Glucose, Bld 111 (*)    BUN 34 (*)    Creatinine, Ser 1.53 (*)    Total Protein 8.3 (*)    GFR, Estimated 41 (*)    All other components within normal limits  CBC WITH DIFFERENTIAL/PLATELET - Abnormal; Notable for the following components:   WBC 15.2 (*)    Hemoglobin 10.7 (*)    HCT 34.9 (*)    MCV 77.2 (*)    MCH 23.7 (*)    RDW 17.3 (*)    Neutro Abs 12.8 (*)    All other components within normal limits  URINALYSIS, ROUTINE W REFLEX MICROSCOPIC - Abnormal; Notable for the following components:   Protein, ur 30 (*)    All other components within normal limits  CULTURE, BLOOD (ROUTINE X 2)  CULTURE, BLOOD (ROUTINE X 2)  LACTIC ACID, PLASMA  PROTIME-INR  LACTIC ACID, PLASMA    EKG None  Radiology DG Chest Portable 1 View  Result Date: 04/05/2022 CLINICAL DATA:  Fever, recent PICC placement EXAM: PORTABLE CHEST 1 VIEW COMPARISON:  03/28/2022 FINDINGS: Single frontal view of the chest demonstrates interval removal of the right-sided chest wall port. Left-sided PICC tip projects over superior vena cava. Cardiac silhouette is unremarkable. No airspace disease, effusion, or pneumothorax. No acute bony abnormalities. IMPRESSION: 1. No acute intrathoracic process. Electronically Signed   By: Randa Ngo M.D.   On: 04/05/2022 21:32    Procedures .Critical Care  Performed by: Lucrezia Starch, MD Authorized by: Lucrezia Starch, MD   Critical care provider statement:    Critical care time (minutes):  44   Critical care was necessary to treat or prevent imminent or life-threatening deterioration of the following conditions:  Sepsis   Critical care was time spent  personally by me on the following activities:  Development of treatment plan with patient or surrogate, discussions with consultants, evaluation of patient's response to treatment, examination of patient, ordering and review of laboratory studies, ordering and review of radiographic studies, ordering and performing treatments and interventions, pulse oximetry, re-evaluation of patient's condition and review of old charts     Medications Ordered in ED Medications  lactated ringers infusion ( Intravenous Not Given 04/05/22 2053)  vancomycin (VANCOREADY) IVPB 1500 mg/300 mL (1,500 mg Intravenous New Bag/Given 04/05/22 2051)  0.9 %  sodium chloride infusion ( Intravenous New Bag/Given 04/05/22 2159)  acetaminophen (TYLENOL) tablet 650 mg (650 mg Oral Given 04/05/22 1854)  ceFEPIme (MAXIPIME) 2 g in sodium chloride 0.9 % 100 mL IVPB (0 g Intravenous Stopped 04/05/22 2055)  sodium chloride 0.9 % bolus 1,000 mL (0 mLs Intravenous Stopped 04/05/22 2159)    And  sodium chloride 0.9 % bolus 1,000 mL (1,000 mLs Intravenous New Bag/Given 04/05/22 2048)    And  sodium chloride 0.9 % bolus 500 mL (500 mLs Intravenous New Bag/Given 04/05/22 2147)  LORazepam (ATIVAN) injection 0.5 mg (0.5 mg Intravenous Given 04/05/22 2035)  methylPREDNISolone sodium succinate (SOLU-MEDROL) 40 mg/mL injection 40 mg (40 mg Intravenous Given 04/05/22 2203)    ED Course/ Medical Decision Making/ A&P                           Medical Decision Making Amount and/or Complexity of Data Reviewed Labs: ordered. Radiology: ordered.  Risk OTC drugs. Prescription drug management. Decision regarding hospitalization.   51 year old presents for fever.  Recent admission for bacteremia.  Discharged on oral Levaquin per ID, patient endorses compliance with this medication.  Here febrile to 102.  Otherwise well-appearing and not in distress.  Concern for possibility of sepsis, broad-spectrum antibiotics ordered, repeat blood cultures ordered, 30 cc/kg  fluid bolus ordered.  Labs reviewed, leukocytosis noted.  No significant electrolyte derangements.  No pneumonia on CXR.  I independently reviewed and interpreted, no obvious infiltrate or edema.  No UTI on urinalysis.  Note patient initially refused blood cultures and fluids however after lengthy discussion with patient, she was ultimately agreeable.   Discussed case with Dr. Sidney Ace with hospitalist who will admit for further management.        Final Clinical Impression(s) / ED Diagnoses Final diagnoses:  Fever, unspecified fever cause  Leukocytosis, unspecified type  PICC (peripherally inserted central catheter) in place    Rx / DC Orders ED Discharge Orders     None         Lucrezia Starch, MD 04/05/22 2222

## 2022-04-05 NOTE — ED Triage Notes (Signed)
Pt. Coming to ED for evaluation of a fever. Per EMS, Pt. Has been previously hospitalized for concerns of infection that led to sepsis.

## 2022-04-05 NOTE — Telephone Encounter (Signed)
Spoke with patient, advised her that Dr. Baxter Flattery would like for her to go to the emergency department. Reports that her temperature is down to 99 after taking Motrin.   She is tearful at this recommendation. States she does not want to go to the hospital and does not have anyone to take her until tomorrow morning.   Relayed that it would be best for her to go as soon as possible and that if her symptoms worsen she should call 911 to transport her. She is open to going to the [facility] facility. Emphasized importance of going to the emergency department. Patient verbalized understanding and has no further questions.    Beryle Flock, RN

## 2022-04-05 NOTE — Telephone Encounter (Signed)
Patient called triage and states she is going to have to call the paramedics to pick her up and take her to [facility]. Advised her this was the best idea since she is unable to drive herself.   Beryle Flock, RN

## 2022-04-05 NOTE — H&P (Addendum)
[place]   PATIENT NAME: [name]    MR#:  [id number]  DATE OF BIRTH:  11-12-1970  DATE OF ADMISSION:  04/05/2022  PRIMARY CARE PHYSICIAN: Nicholas Lose, MD   Patient is coming from: Home  REQUESTING/REFERRING PHYSICIAN: Madalyn Rob, MD  CHIEF COMPLAINT:   Chief Complaint  Patient presents with  . Fever    HISTORY OF PRESENT ILLNESS:  [name] is a 51 y.o. female with medical history significant for Addison's disease, breast cancer status post bilateral mastectomy and reconstruction, CHF, chronic respiratory failure on home O2 at 3 to 4 L/min, Hodgkin's lymphoma, GERD, seizures, sleep apnea and SVT, who presented to the ER with acute onset of fever with associated mild chills.  She was recently admitted here for bacteremia due to Enterobacter species and discharged on Saturday, after being managed with IV antibiotic therapy here.  She was discharged on p.o. Levaquin 750 mg for 10 days from the date of her port removal.  She had a PICC line placed in the left arm for expected need of IV antibiotic therapy.  She admits to a cough which has been slightly worse without wheezing or dyspnea.  No chest pain or palpitations.  She has nausea without vomiting or diarrhea.  No dysuria, oliguria or hematuria or flank pain.  ED Course: When she came to the ER, heart rate was 110 with temperature of 102.1 and respiratory rate of 29.  Labs revealed a BUN of 34 and creatinine 1.53 up from normal levels on 7/1 and CBC showed leukocytosis 15.2 up from 8.7 then with neutrophilia and anemia better than previous levels.  PT and INR within normal  Imaging: Portable chest ray showed no acute cardiopulmonary disease.  The patient was given IV vancomycin and cefepime, 2.5 L of IV normal saline bolus followed by 150 mL/h of lactated Ringer, Tylenol 650 mg p.o. Ativan 0.5 mg IV and Solu-Medrol 40 mg IV.  She will be admitted to a telemetry bed for further evaluation and management. PAST  MEDICAL HISTORY:   Past Medical History:  Diagnosis Date  . Addison's disease ([place])   . Adrenal insufficiency ([place])   . Anemia   . Anxiety   . Aortic stenosis   . Aortic stenosis   . Appendicitis 12/19/09  . Appendicitis   . Breast cancer ([place])    STATUS POST BILATERAL MASTECTOMY. STATUS POST RECONSTRUCTION. SHE HAD SILICONE BREAST IMPLANTS AND THE LEFT IMPLANT IS LEAKING SLIGHTLY  . Cellulitis of right middle finger 11/07/2018  . Cervical cancer ([place]) 12/23/2018  . Chest pain   . CHF with right heart failure ([place]) 04/17/2017  . Chronic respiratory failure with hypoxia ([place]) 12/23/2018  . Cough variant asthma 04/13/2019  . Depression   . GERD (gastroesophageal reflux disease)    takes Dexilant and carafate and gi coctail   . Headache    migraines on a daily and monthly regimen   . Heart murmur   . History of kidney stones   . Hodgkin lymphoma ([facility])    STATUS POST MANTLE RADIATION  . Hodgkin's lymphoma ([place])    1987  . Hypertension   . Hypoxia   . Necrotizing fasciitis ([place]) 12/23/2018  . Non-ischemic cardiomyopathy ([place])   . Osteoporosis   . Palpitations   . 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 Pneumonia   . PONV (postoperative nausea and vomiting)   . Pre-diabetes    per pt; no meds  . Pulmonary hypertension ([place]) 12/23/2018  .  Raynaud phenomenon   . Right heart failure ([place]) 04/17/2017  . Seizures ([place])    last febrile seizure was approx 3 weeks ago per report on 12/01/2020  . Sleep apnea    upcoming sleep study per pt   . Supplemental oxygen dependent    3 liters  . SVT (supraventricular tachycardia) ([place])   . Tachycardia   . Thyroid cancer ([place])    STATUS POST SURGICAL REMOVAL-CURRENT ON THYROID REPLACEMENT    PAST SURGICAL HISTORY:   Past Surgical History:  Procedure Laterality Date  . ABDOMINAL HYSTERECTOMY    . AMPUTATION Left 01/30/2019   Procedure: Left Index finger amputation with flap reconstruction and repair reconstruction;  Surgeon: Roseanne Kaufman, MD;   Location: [facility];  Service: Orthopedics;  Laterality: Left;  . APPENDECTOMY    . breast implants and removal     . breast implants but leaking     . CARDIAC CATHETERIZATION  05/18/09   NORMAL CATH  . COLONOSCOPY    . hx of chemotherapy     . hx of radiation therapy     . I & D EXTREMITY Left 12/23/2018   Procedure: IRRIGATION AND DEBRIDEMENT HAND / INDEX FINGER;  Surgeon: Roseanne Kaufman, MD;  Location: [facility];  Service: Orthopedics;  Laterality: Left;  . IR CV LINE INJECTION  03/22/2022  . IR IMAGING GUIDED PORT INSERTION  05/06/2020  . IR IMAGING GUIDED PORT INSERTION  12/04/2021  . IR REMOVAL TUN ACCESS W/ PORT W/O FL MOD SED  04/27/2020  . IR REMOVAL TUN ACCESS W/ PORT W/O FL MOD SED  12/04/2021  . IR REMOVAL TUN ACCESS W/ PORT W/O FL MOD SED  03/30/2022  . KIDNEY STONE SURGERY    . LUMBAR PUNCTURE W/ INTRATHECAL CHEMOTHERAPY    . MASTECTOMY    . PITUITARY SURGERY    . RIGHT/LEFT HEART CATH AND CORONARY ANGIOGRAPHY N/A 04/02/2018   Procedure: RIGHT/LEFT HEART CATH AND CORONARY ANGIOGRAPHY;  Surgeon: Burnell Blanks, MD;  Location: [facility];  Service: Cardiovascular;  Laterality: N/A;  . TOOTH EXTRACTION N/A 12/05/2020   Procedure: DENTAL RESTORATION/EXTRACTIONS;  Surgeon: Ronal Fear, MD;  Location: [facility];  Service: Oral Surgery;  Laterality: N/A;  . TOTAL THYROIDECTOMY    . VIDEO BRONCHOSCOPY Bilateral 11/14/2018   Procedure: VIDEO BRONCHOSCOPY WITHOUT FLUORO;  Surgeon: Margaretha Seeds, MD;  Location: [facility];  Service: Cardiopulmonary;  Laterality: Bilateral;  . VIDEO BRONCHOSCOPY WITH ENDOBRONCHIAL ULTRASOUND N/A 11/19/2018   Procedure: VIDEO BRONCHOSCOPY WITH ENDOBRONCHIAL ULTRASOUND;  Surgeon: Margaretha Seeds, MD;  Location: [facility];  Service: Thoracic;  Laterality: N/A;    SOCIAL HISTORY:   Social History   Tobacco Use  . Smoking status: Never  . Smokeless tobacco: Never  Substance Use Topics  . Alcohol use: Not Currently    Comment: social      FAMILY HISTORY:   Family History  Family history unknown: Yes    DRUG ALLERGIES:   Allergies  Allergen Reactions  . Ferrous Bisglycinate Chelate [Iron] Anaphylaxis    Only IV   . Mushroom Extract Complex Anaphylaxis  . Na Ferric Gluc Cplx In Sucrose Anaphylaxis  . Cymbalta [Duloxetine Hcl] Swelling and Anxiety  . Phenergan [Promethazine]     Other reaction(s): Unknown  . Promethazine Hcl     Other reaction(s): Unknown  . Succinylcholine Other (See Comments)    Lock Jaw  . Buprenorphine Hcl Hives  . Compazine Other (See Comments)    Altered mental status  Aggression  . Metoclopramide Other (See Comments)    Dystonia  . Morphine And Related Hives  . Ondansetron Hives and Rash    Other reaction(s): Unknown Other reaction(s): Unknown  . Promethazine Hcl Hives  . Tegaderm Ag Mesh [Silver] Rash    Old formulation only, is able to tolerate new formulation    REVIEW OF SYSTEMS:   ROS As per history of present illness. All pertinent systems were reviewed above. Constitutional, HEENT, cardiovascular, respiratory, GI, GU, musculoskeletal, neuro, psychiatric, endocrine, integumentary and hematologic systems were reviewed and are otherwise negative/unremarkable except for positive findings mentioned above in the HPI.   MEDICATIONS AT HOME:   Prior to Admission medications   Medication Sig Start Date End Date Taking? Authorizing Provider  albuterol (PROVENTIL) (2.5 MG/3ML) 0.083% nebulizer solution Take 3 mLs (2.5 mg total) by nebulization every 6 (six) hours as needed for wheezing or shortness of breath. 03/27/21 06/16/21  Magdalen Spatz, NP  albuterol (VENTOLIN HFA) 108 (90 Base) MCG/ACT inhaler Inhale 2 puffs into the lungs every 6 (six) hours as needed for wheezing or shortness of breath.    [provider]  ALPRAZolam Duanne Moron) 0.5 MG tablet Take 1 tablet (0.5 mg total) by mouth 2 (two) times daily as needed for anxiety. 02/15/22   Mozingo, Berdie Ogren, NP   aspirin 325 MG tablet Take 325 mg by mouth at bedtime.     [provider]  bumetanide (BUMEX) 1 MG tablet TAKE 1 TABLET BY MOUTH TWICE A DAY Patient taking differently: Take 1 mg by mouth 2 (two) times daily. 07/17/21   Nicholas Lose, MD  buPROPion (WELLBUTRIN XL) 150 MG 24 hr tablet Take 1 tablet (150 mg total) by mouth daily. 03/27/22   Mozingo, Berdie Ogren, NP  dexlansoprazole (DEXILANT) 60 MG capsule Take 60 mg by mouth daily.    [provider]  Diclofenac Potassium,Migraine, (CAMBIA) 50 MG PACK Take 1 packet by mouth as needed for migraine. 05/16/21   [provider]  diphenhydrAMINE (BENADRYL) 50 MG/ML injection Inject 1 mL (50 mg total) into the vein every 6 (six) hours as needed (nausea). 12/25/18   Rai, Ripudeep K, MD  dronabinol (MARINOL) 2.5 MG capsule TAKE 1 CAPSULE BY MOUTH TWICE DAILY AS NEEDED Patient taking differently: Take 2.5 mg by mouth 2 (two) times daily as needed (nausea). 12/26/21   Nicholas Lose, MD  EPINEPHrine 0.3 mg/0.3 mL IJ SOAJ injection Inject 0.3 mg into the muscle as needed for anaphylaxis. 06/13/21   Margaretha Seeds, MD  escitalopram (LEXAPRO) 20 MG tablet Take 1 tablet (20 mg total) by mouth every evening. 11/08/21   Mozingo, Berdie Ogren, NP  fluconazole (DIFLUCAN) 150 MG tablet Take 1 tablet (150 mg total) by mouth daily. If needed for yeast infection Patient not taking: Reported on 03/02/2022 11/24/21   Carlyle Basques, MD  fluticasone-salmeterol (ADVAIR HFA) [phone] MCG/ACT inhaler Inhale 2 puffs into the lungs 2 (two) times daily. 02/02/22   Margaretha Seeds, MD  Galcanezumab-gnlm 120 MG/ML SOAJ Inject 120 mg into the skin every 28 (twenty-eight) days.  03/31/19   [provider]  heparin lock flush 100 UNIT/ML SOLN injection USE 5 MLS AS A HEPLOCK IN PORT A CATH ONCE DAILY AFTER MEDICATION ADMINISTRATION OR AS DIRECTED BY PHYSICIAN. 10/30/21 10/30/22  Nicholas Lose, MD  hydrocortisone (CORTEF) 10 MG tablet Take 1-2 tablets  (10-20 mg total) by mouth See admin instructions. Take 20 mg in the am and 30m in the evening Patient taking differently: Take  10-20 mg by mouth See admin instructions. Take 20 mg by mouth in the morning and 15m in the evening 02/17/20   KCherylann RatelA, DO  lamoTRIgine (LAMICTAL) 100 MG tablet Take one tablet at bedtime. 03/23/22   Mozingo, RBerdie Ogren NP  Lancets ([facility] HospitalDELICA PLUS [id number] MISC 3 (three) times daily. for testing 06/12/21   [provider]  levofloxacin (LEVAQUIN) 750 MG tablet Take 1 tablet (750 mg total) by mouth daily for 10 days. 03/31/22 04/10/22  KAntonieta Pert MD  metoprolol succinate (TOPROL-XL) 25 MG 24 hr tablet Take 37.5 mg by mouth daily.     [provider]  [facility] CenterVER[facility] test strip 3 (three) times daily. for testing 06/12/21   [provider]  OXYGEN Inhale 3-4 L into the lungs continuous.    [provider]  potassium chloride (KLOR-CON) 10 MEQ tablet Take 1 tablet (10 mEq total) by mouth daily for 21 days. 07/15/20 08/05/20  PAlfredia Client PA-C  predniSONE (DELTASONE) 10 MG tablet Take 4 tabs x 2 days, then 3 x 2d, then 2 x 2d, then 1 x 2d and STOP Patient not taking: Reported on 03/29/2022 03/27/21   GMagdalen Spatz NP  Rimegepant Sulfate (NURTEC) 75 MG TBDP Take 75 mg by mouth daily as needed (Migraine).     [provider]  rosuvastatin (CRESTOR) 10 MG tablet Take 10 mg by mouth daily.  02/28/18   [provider]  saccharomyces boulardii (FLORASTOR) 250 MG capsule Take 1 capsule (250 mg total) by mouth 2 (two) times daily for 14 days. 03/31/22 04/14/22  KAntonieta Pert MD  sitaGLIPtin (JANUVIA) 100 MG tablet Take 1 tablet (100 mg total) by mouth daily. 10/19/21   GNicholas Lose MD  Sodium Chloride Flush (NORMAL SALINE FLUSH) 0.9 % SOLN FLUSH WITH TWO 10 ML FLUSHES IN PORT BEFORE AND AFTER FLUIDS AND MEDICATION ADMINISTRATION, TO MAINTAIN PATENCY--USE UP TO 4 TIMES DAILY. 10/30/21 10/30/22  GNicholas Lose MD  sucralfate  (CARAFATE) 1 g tablet Take 1 g by mouth daily as needed (FOR ULCERS).     [provider]  SYNTHROID 100 MCG tablet Take 1 tablet (100 mcg total) by mouth every morning. 10/19/21   GNicholas Lose MD  topiramate (TOPAMAX) 50 MG tablet Take 150 mg by mouth at bedtime.  12/25/19   [provider]  zolpidem (AMBIEN CR) 12.5 MG CR tablet Take 12.5 mg by mouth at bedtime as needed. 03/05/22   [provider]      VITAL SIGNS:  Blood pressure 100/69, pulse 80, temperature (!) 97.5 F (36.4 C), temperature source Oral, resp. rate 20, SpO2 94 %.  PHYSICAL EXAMINATION:  Physical Exam  GENERAL:  51y.o.-year-old patient lying in the bed with no acute distress.  EYES: Pupils equal, round, reactive to light and accommodation. No scleral icterus. Extraocular muscles intact.  HEENT: Head atraumatic, normocephalic. Oropharynx and nasopharynx clear.  NECK:  Supple, no jugular venous distention. No thyroid enlargement, no tenderness.  LUNGS: Normal breath sounds bilaterally, no wheezing, rales,rhonchi or crepitation. No use of accessory muscles of respiration.  CARDIOVASCULAR: Regular rate and rhythm, S1, S2 normal. No murmurs, rubs, or gallops.  Intact left upper extremity PICC line with no surrounding erythema or tenderness or induration. ABDOMEN: Soft, nondistended, nontender. Bowel sounds present. No organomegaly or mass.  EXTREMITIES: No pedal edema, cyanosis, or clubbing.  NEUROLOGIC: Cranial nerves II through XII are intact. Muscle strength 5/5 in all extremities. Sensation intact. Gait not checked.  PSYCHIATRIC: The patient  is alert and oriented x 3.  Normal affect and good eye contact. SKIN: No obvious rash, lesion, or ulcer.   LABORATORY PANEL:   CBC Recent Labs  Lab 04/05/22 1854  WBC 15.2*  HGB 10.7*  HCT 34.9*  PLT 378   ------------------------------------------------------------------------------------------------------------------  Chemistries  Recent Labs   Lab 04/05/22 1854  NA 137  K 4.0  CL 106  CO2 21*  GLUCOSE 111*  BUN 34*  CREATININE 1.53*  CALCIUM 9.4  AST 29  ALT 43  ALKPHOS 86  BILITOT 0.6   ------------------------------------------------------------------------------------------------------------------  Cardiac Enzymes No results for input(s): "TROPONINI" in the last 168 hours. ------------------------------------------------------------------------------------------------------------------  RADIOLOGY:  DG Chest Portable 1 View  Result Date: 04/05/2022 CLINICAL DATA:  Fever, recent PICC placement EXAM: PORTABLE CHEST 1 VIEW COMPARISON:  03/28/2022 FINDINGS: Single frontal view of the chest demonstrates interval removal of the right-sided chest wall port. Left-sided PICC tip projects over superior vena cava. Cardiac silhouette is unremarkable. No airspace disease, effusion, or pneumothorax. No acute bony abnormalities. IMPRESSION: 1. No acute intrathoracic process. Electronically Signed   By: Randa Ngo M.D.   On: 04/05/2022 21:32      IMPRESSION AND PLAN:  Assessment and Plan: * Fever - This is concerning for recurrent bacteremia and sepsis especially given associated leukocytosis with tachycardia and tachypnea as well as fever. - The patient will be admitted to a telemetry bed. - We will continue antibiotic therapy with broad-spectrum coverage with IV vancomycin, cefepime and Flagyl. - ID consult will likely be needed.  GERD without esophagitis - We will continue PPI therapy  Anxiety and depression We will continue Lexapro and Lamictal as well as Xanax and Wellbutrin XL  Addison's disease ([place]) - She was given stress dose of steroids and will be continued on her regular home dose while monitoring her blood pressure.  Hyperlipidemia - We will continue statin therapy  Hypertension - We will continue antihypertensive    DVT prophylaxis: Lovenox.  Advanced Care Planning:  Code Status: She is DNR/DNI.Marland Kitchen   Family Communication:  The plan of care was discussed in details with the patient (and family). I answered all questions. The patient agreed to proceed with the above mentioned plan. Further management will depend upon hospital course. Disposition Plan: Back to previous home environment Consults called: ID consult can be called in a.m. All the records are reviewed and case discussed with ED provider.  Status is: Inpatient    At the time of the admission, it appears that the appropriate admission status for this patient is inpatient.  This is judged to be reasonable and necessary in order to provide the required intensity of service to ensure the patient's safety given the presenting symptoms, physical exam findings and initial radiographic and laboratory data in the context of comorbid conditions.  The patient requires inpatient status due to high intensity of service, high risk of further deterioration and high frequency of surveillance required.  I certify that at the time of admission, it is my clinical judgment that the patient will require inpatient hospital care extending more than 2 midnights.                            Dispo: The patient is from: Home              Anticipated d/c is to: Home              Patient currently is not medically stable  to d/c.              Difficult to place patient: No  Christel Mormon M.D on 04/06/2022 at 5:13 AM  [facility]   From 7 PM-7 AM, contact night-coverage [email or web address]  CC: Primary care physician; Nicholas Lose, MD

## 2022-04-05 NOTE — ED Notes (Signed)
   Patient refused PIV.  Patient has PICC line and fever.  Patient needs second line and blood cultures for treatment but refusing.  Dr Roslynn Amble notified and coming to speak with patient.

## 2022-04-05 NOTE — Progress Notes (Signed)
A consult was received from an ED physician for cefepime and vancomycin per pharmacy dosing.  The patient's profile has been reviewed for ht/wt/allergies/indication/available labs.    A one time order has been placed for vancomycin 1500 mg + cefepime 2 g IV once.    Further antibiotics/pharmacy consults should be ordered by admitting physician if indicated.                       Thank you,  Lenis Noon, PharmD 04/05/2022  7:21 PM

## 2022-04-05 NOTE — Telephone Encounter (Signed)
Patient called, states she was recently discharged from the hospital where her port was removed and she had a PICC line placed.   She reports a fever of 103, denies chills. She complains of pain in the arm of her PICC line, but emphasizes that it has been painful from the moment it was placed. Denies redness or swelling of the extremity.   Reports that she has not missed a dose of her Levaquin.   RN encouraged her to seek evaluation at the emergency department, she would like to know what her other options are as she wants to stay out of the hospital. Will route to provider.   Beryle Flock, RN

## 2022-04-05 NOTE — ED Notes (Signed)
  Patient being difficult and refusing cares.  Patient states she did not give permission for second blood culture set after she had just spoke with Dr Roslynn Amble and told him ok.  Patient now refusing to wear BP cuff and let us start her NS boluses as part of the sepsis protocol.  Dr Roslynn Amble notified.

## 2022-04-05 NOTE — Sepsis Progress Note (Addendum)
Elink following Code Sepsis  Notified provider of need to order blood cultures. Blood cultures have already been collected. Blood cultures collected before antibiotics given.

## 2022-04-06 ENCOUNTER — Encounter: Payer: Self-pay | Admitting: Internal Medicine

## 2022-04-06 ENCOUNTER — Inpatient Hospital Stay (HOSPITAL_COMMUNITY): Payer: BC Managed Care – PPO

## 2022-04-06 DIAGNOSIS — B9689 Other specified bacterial agents as the cause of diseases classified elsewhere: Secondary | ICD-10-CM

## 2022-04-06 DIAGNOSIS — J189 Pneumonia, unspecified organism: Secondary | ICD-10-CM | POA: Diagnosis present

## 2022-04-06 DIAGNOSIS — A419 Sepsis, unspecified organism: Secondary | ICD-10-CM | POA: Diagnosis present

## 2022-04-06 DIAGNOSIS — K219 Gastro-esophageal reflux disease without esophagitis: Secondary | ICD-10-CM | POA: Diagnosis not present

## 2022-04-06 DIAGNOSIS — R509 Fever, unspecified: Secondary | ICD-10-CM | POA: Diagnosis not present

## 2022-04-06 DIAGNOSIS — R52 Pain, unspecified: Secondary | ICD-10-CM

## 2022-04-06 DIAGNOSIS — F419 Anxiety disorder, unspecified: Secondary | ICD-10-CM | POA: Diagnosis not present

## 2022-04-06 DIAGNOSIS — I10 Essential (primary) hypertension: Secondary | ICD-10-CM

## 2022-04-06 DIAGNOSIS — Z452 Encounter for adjustment and management of vascular access device: Secondary | ICD-10-CM | POA: Diagnosis not present

## 2022-04-06 DIAGNOSIS — R7881 Bacteremia: Secondary | ICD-10-CM | POA: Diagnosis not present

## 2022-04-06 DIAGNOSIS — R6520 Severe sepsis without septic shock: Secondary | ICD-10-CM

## 2022-04-06 DIAGNOSIS — N179 Acute kidney failure, unspecified: Secondary | ICD-10-CM

## 2022-04-06 DIAGNOSIS — E271 Primary adrenocortical insufficiency: Secondary | ICD-10-CM | POA: Diagnosis not present

## 2022-04-06 DIAGNOSIS — F32A Depression, unspecified: Secondary | ICD-10-CM

## 2022-04-06 LAB — PROTIME-INR
INR: 1.2 (ref 0.8–1.2)
Prothrombin Time: 15.3 seconds — ABNORMAL HIGH (ref 11.4–15.2)

## 2022-04-06 LAB — BASIC METABOLIC PANEL
Anion gap: 7 (ref 5–15)
BUN: 27 mg/dL — ABNORMAL HIGH (ref 6–20)
CO2: 18 mmol/L — ABNORMAL LOW (ref 22–32)
Calcium: 8 mg/dL — ABNORMAL LOW (ref 8.9–10.3)
Chloride: 112 mmol/L — ABNORMAL HIGH (ref 98–111)
Creatinine, Ser: 0.98 mg/dL (ref 0.44–1.00)
GFR, Estimated: >60 mL/min (ref >60)
Glucose, Bld: 151 mg/dL — ABNORMAL HIGH (ref 70–99)
Potassium: 4 mmol/L (ref 3.5–5.1)
Sodium: 137 mmol/L (ref 135–145)

## 2022-04-06 LAB — CBC
HCT: 29.7 % — ABNORMAL LOW (ref 36.0–46.0)
Hemoglobin: 8.9 g/dL — ABNORMAL LOW (ref 12.0–15.0)
MCH: 23.7 pg — ABNORMAL LOW (ref 26.0–34.0)
MCHC: 30 g/dL (ref 30.0–36.0)
MCV: 79 fL — ABNORMAL LOW (ref 80.0–100.0)
Platelets: 284 10*3/uL (ref 150–400)
RBC: 3.76 MIL/uL — ABNORMAL LOW (ref 3.87–5.11)
RDW: 17.4 % — ABNORMAL HIGH (ref 11.5–15.5)
WBC: 11 10*3/uL — ABNORMAL HIGH (ref 4.0–10.5)
nRBC: 0 % (ref 0.0–0.2)

## 2022-04-06 LAB — SEDIMENTATION RATE: Sed Rate: 40 mm/hr — ABNORMAL HIGH (ref 0–22)

## 2022-04-06 LAB — CORTISOL-AM, BLOOD: Cortisol - AM: 4.6 ug/dL — ABNORMAL LOW (ref 6.7–22.6)

## 2022-04-06 LAB — C-REACTIVE PROTEIN: CRP: 11.1 mg/dL — ABNORMAL HIGH (ref <1.0)

## 2022-04-06 LAB — PROCALCITONIN: Procalcitonin: 2 ng/mL

## 2022-04-06 MED ORDER — LACTATED RINGERS IV SOLN: INTRAVENOUS | Status: DC

## 2022-04-06 MED ORDER — VANCOMYCIN HCL 1500 MG/300ML IV SOLN
1500.0000 mg | INTRAVENOUS | Status: DC
  Administered 2022-04-06 – 2022-04-07 (×2): 1500 mg via INTRAVENOUS
  Filled 2022-04-06 (×3): qty 300

## 2022-04-06 MED ORDER — VANCOMYCIN HCL IN DEXTROSE 1-5 GM/200ML-% IV SOLN: 1000.0000 mg | INTRAVENOUS | Status: DC

## 2022-04-06 MED ORDER — DEXLANSOPRAZOLE 60 MG PO CPDR
60.0000 mg | DELAYED_RELEASE_CAPSULE | Freq: Every day | ORAL | Status: DC
  Administered 2022-04-06 – 2022-04-09 (×4): 60 mg via ORAL
  Filled 2022-04-06 (×4): qty 1

## 2022-04-06 MED ORDER — HYDROMORPHONE HCL 1 MG/ML IJ SOLN
0.5000 mg | Freq: Once | INTRAMUSCULAR | Status: AC
  Administered 2022-04-06: 0.5 mg via INTRAVENOUS
  Filled 2022-04-06: qty 0.5

## 2022-04-06 MED ORDER — SODIUM CHLORIDE 0.9 % IV SOLN
2.0000 g | Freq: Two times a day (BID) | INTRAVENOUS | Status: DC
  Filled 2022-04-06: qty 12.5

## 2022-04-06 MED ORDER — SODIUM CHLORIDE 0.9 % IV SOLN
2.0000 g | Freq: Three times a day (TID) | INTRAVENOUS | Status: DC
  Administered 2022-04-06 – 2022-04-09 (×10): 2 g via INTRAVENOUS
  Filled 2022-04-06 (×10): qty 12.5

## 2022-04-06 NOTE — Progress Notes (Addendum)
Initial Nutrition Assessment  DOCUMENTATION CODES:   Not applicable  INTERVENTION:  - will order [employer] Instant Breakfast with 2% milk BID, each supplement provides 260 kcal and 13 grams protein.  - will order daily snack of fresh fruit and cottage cheese.  - unable to order multivitamin due to listed reaction of anaphylaxis with 2 of the components.  - liberalized diet from Heart Healthy to Regular.   - weigh patient today.   NUTRITION DIAGNOSIS:   Increased nutrient needs related to acute illness as evidenced by estimated needs.  GOAL:   Patient will meet greater than or equal to 90% of their needs  MONITOR:   PO intake, Supplement acceptance, Labs, Weight trends  REASON FOR ASSESSMENT:   Malnutrition Screening Tool  ASSESSMENT:   51 y.o. 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 She presented to the ED on 7/6 due to new onset of fever and chills, cough without wheezing or dyspnea, and intermittent nausea without vomiting or diarrhea. She was recently admitted to [facility] due to bacteremia and was discharged on 7/1. During that admission she had port removed and PICC placed in L arm d/t need for IV abx. Patient admitted due to sepsis suspected to be d/t recurrent bacteremia.  Patient shares that she is feeling very anxious and that she has noticed that she is beginning to have memory problems, which is very concerning for her.  Patient moving around the room, including to use the bathroom, at  times during visit. Patient shares about medical hx as far back as lymphoma dx when she was 51 years old. Patient tearful at times when discussing her medical history and that of her dad's.   Patient shares that some of the medications she is on cause ulcers in her mouth and esophagus. 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 She avoids things high in acid and salt. She does best with foods that are softer and do not have added spice or salt.   This morning she had sliced peaches and scrambled eggs for breakfast. She is keeping her Cheerios to have as a snack later. She enjoys things such as fruit, cottage cheese, 2% milk, and drinks Premier Protein at home. She is receptive to [employer] (vanilla) with 2% milk.   She has not been weighed since 6/28 when she weighed 177 lb, weight on 6/2 was 181 lb (4 lb weight loss, 2.2% body weight), and weight on 5/5 was 185 lb (8 lb weight loss (4.3% body weight).  Asked patient about marinol order and use at home. She shares that she sometimes takes it at night at home, but that she would prefer not to take it. She shares that she is concerned this is contributing to her memory issues. Patient would like order to remain as is, PRN.   Labs reviewed; Cl: 112 mmol/l, BUN: 27 mg/dl, Ca: 8 mg/dl.  Medications reviewed; 2.5 mg marinol BID PRN, 10 mg cortef/day, 100 mcg oral synthroid/day, 5 mg tradjenta/day, 250 mg florastor BID, 1 g carafate PRN.     NUTRITION -  FOCUSED PHYSICAL EXAM:  Flowsheet Row Most Recent Value  Orbital Region No depletion  Upper Arm Region No depletion  Thoracic and Lumbar Region No depletion  Buccal Region No depletion  Temple Region No depletion  Clavicle Bone Region No depletion  Clavicle and Acromion Bone Region No depletion  Scapular Bone Region No depletion  Dorsal Hand No depletion  Patellar Region No depletion  Anterior Thigh Region No depletion   Posterior Calf Region No depletion  Edema (RD Assessment) Mild  [BLE]  Hair Reviewed  Eyes Reviewed  Mouth Reviewed  Skin Reviewed  Nails Reviewed       Diet Order:   Diet Order             Diet regular Room service appropriate? Yes; Fluid consistency: Thin  Diet effective now                   EDUCATION NEEDS:   Education needs have been addressed  Skin:  Skin Assessment: Skin Integrity Issues: Skin Integrity Issues:: Incisions Incisions: L finger (6/29); R upper chest (6/30)  Last BM:  PTA/unknown  Height:   Ht Readings from Last 1 Encounters:  03/28/22 _0  (1.651 m)    Weight:   Wt Readings from Last 1 Encounters:  03/28/22 80.3 kg     Estimated Nutritional Needs:  Kcal:  2010-2250 kcal Protein:  100-115 grams Fluid:  >/= 2.2 L/day     Jarome Matin, MS, RD, LDN, CNSC Registered Dietitian II Inpatient Clinical Nutrition RD pager # and on-call/weekend pager # available in [facility]

## 2022-04-06 NOTE — Progress Notes (Addendum)
Pharmacy Antibiotic Note  [name] is a 51 y.o. female admitted on 04/05/2022 with sepsis.  Pharmacy has been consulted for Vanco dosing.  ID: Sepsis with recent Enterobacter bacteremia - Tmax 102.1 but currently afebrile. WBC 15>11, Scr down to 0.98, PC 2  Antimicrobials this admission: 7/6 Cefepime >>   7/6 Vancomycin >>   7/7 Metronidazole >>7/13   Microbiology results: 7/6 BCx:    Previous admission:  6/29 Meropenem>> 6/29 6/29 Cefepime> Microbiology results: 6/26 BCx2: E, aerogenes ( R to amox/clav, cefazolin)  Plan: Adjust to Vancomycin 1500 mg IV Q 24 hrs. Goal AUC 400-550. Expected AUC: 470, SCr used: 0.98 Cefepime 2gm IV q8h Metronidazole per MD     Temp (24hrs), Avg:99 F (37.2 C), Min:97.5 F (36.4 C), Max:102.1 F (38.9 C)  Recent Labs  Lab 03/31/22 0447 04/05/22 1854 04/06/22 0546  WBC 8.7 15.2* 11.0*  CREATININE 0.87 1.53* 0.98  LATICACIDVEN  --  1.3  --     Estimated Creatinine Clearance: 71.1 mL/min (by C-G formula based on SCr of 0.98 mg/dL).    Allergies  Allergen Reactions   Ferrous Bisglycinate Chelate [Iron] Anaphylaxis    Only IV    Mushroom Extract Complex Anaphylaxis   Na Ferric Gluc Cplx In Sucrose Anaphylaxis   Cymbalta [Duloxetine Hcl] Swelling and Anxiety   Phenergan [Promethazine]     Other reaction(s): Unknown   Promethazine Hcl     Other reaction(s): Unknown   Succinylcholine Other (See Comments)    Lock Jaw   Buprenorphine Hcl Hives   Compazine Other (See Comments)    Altered mental status Aggression   Metoclopramide Other (See Comments)    Dystonia   Morphine And Related Hives   Ondansetron Hives and Rash    Other reaction(s): Unknown Other reaction(s): Unknown   Promethazine Hcl Hives   Tegaderm Ag Mesh [Silver] Rash    Old formulation only, is able to tolerate new formulation    Sadik Piascik S. Alford Highland, PharmD, BCPS Clinical Staff Pharmacist [email or web address]  Wayland Salinas 04/06/2022 8:36 AM

## 2022-04-06 NOTE — Progress Notes (Signed)
PROGRESS NOTE    [name]  FYB:[id number] DOB: 12/08/1970 DOA: 04/05/2022 PCP: Nicholas Lose, MD    Brief Narrative:   [name] is a 51 y.o. 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  Reported fever at home 103.9.  Patient reports cough without wheezing or dyspnea.  Also with occasional nausea without vomiting/diarrhea.  Denies chest pains, no palpitations, no dysuria, hematuria, no flank pain, no urinary urgency.  Recently admitted at [facility] for bacteremia due to Enterobacter species and discharged on 03/31/2022 with continue antibiotics with Levaquin 750 mg daily for 10 days from the time of her port removal.  During the hospitalization she had a PICC line placed in her left arm for history of poor venous access and need of multiple blood draws from her outpatient providers.  Patient reports compliance with the Levaquin and no missed doses.  No sick contacts or other changes in home medications.  In the ED, temperature 102.1 F, HR 110, RR 29, BP 110/68, SPO2 100% on 3L [place] which is her baseline requirement.  WBC 15.2, hemoglobin 10.7, platelets 378.  Sodium 137, potassium 4.0, chloride 106, CO2 21, glucose 111, BUN 34, creatinine 1.53.  Lactic acid 1.3.  INR 1.2.  Urinalysis unrevealing.  Chest x-ray with no acute cardiopulmonary disease process.  Patient was started on  IV vancomycin/cefepime.  Given 2.5 L IV normal saline bolus, Tylenol 650 mg p.o. x1, Ativan 0.5 mg IV x1 and Solu-Medrol 40 mg IV.  TRH consulted for further evaluation and management of fever concerning for sepsis with recurrent bacteremia.  Assessment & Plan:   Sepsis, POA Recent Enterobacter aerogenes bacteremia Patient presenting to the ED with recurrent fever up to 103.9 at home.  On arrival to the ED temperature 102.1 F with tachycardia, tachypnea and elevated WBC count of 15.2 with evidence of acute endorgan damage with renal failure with creatinine 1.53 on admission.  Procalcitonin elevated 2.00.  Chest x-ray and urinalysis unrevealing.  Previous port site with dressing in place and new PICC line site left upper extremity with no concerning fluctuance/erythema.  --ID consulted --WBC 15.2>11.0 --PCT 2.00 --Blood cultures x 2: pending --Vancomycin, pharmacy consulted for dosing/monitoring --Cefepime --Flagyl --Supportive care, antipyretics --CBC daily  Acute renal failure: Resolved Patient presenting with elevated creatinine of 1.53, likely secondary to prerenal azotemia in the setting of dehydration versus ATN from sepsis as above.  Received aggressive IV fluid resuscitation in the ED. --Cr 1.53>0.98 --Avoid nephrotoxins, renally dose all medications --Will hold further IV fluids at this time given history of diastolic CHF --BMP daily  Chronic hypoxemic respiratory failure History of asthma Follows with pulmonology outpatient, Dr. Loanne Drilling.  At baseline on 3-4 L nasal cannula.  Chest x-ray on admission with no acute cardiopulmonary disease process. --Continue supplemental oxygen at baseline requirements --SPO2 checks with vital signs  Chronic diastolic congestive heart failure, compensated Follows with cardiology outpatient, Dr. Acie Fredrickson.  Last TTE 05/2021 with LVEF 52-77%, grade 1 diastolic dysfunction.   --Metoprolol succinate 37.5 mg  p.o. daily --Bumex 1 mg p.o. twice  daily --Strict I's and O's and daily weights  Anxiety Major depressive disorder --Wellbutrin 150 mg p.o. daily --Lexapro 20 mg p.o. daily --Xanax 0.5 mg p.o. twice daily as needed anxiety  Hx Addison's disease with adrenal insufficiency --Hydrocortisone 20 mg p.o. daily --Hydrocortisone 10 mg p.o. nightly  GERD:  --Dexilant 60 mg p.o. daily --Carafate as needed  Hypothyroidism --Levothyroxine 100 mcg p.o. daily  Hx migraine headache/seizure --Topamax 150 mg p.o. daily --Lamictal 100 mg p.o. qHS  Hx breast cancer, non-Hodgkin's lymphoma Continue outpatient follow-up with medical oncology   DVT prophylaxis: enoxaparin (LOVENOX) injection 40 mg Start: 04/05/22 2230    Code Status: DNR Family Communication: No family present at bedside this morning  Disposition Plan:  Level of care: Telemetry Status is: Inpatient Remains inpatient appropriate because: Continue to investigate fever of unclear etiology with history of recent gram-negative bacteremia with PICC line in place, requiring IV antibiotics, awaiting further recommendations from infectious disease    Consultants:  Infectious disease  Procedures:  None  Antimicrobials:  Vancomycin  7/6>> Cefepime 7/6>> Flagyl 7/6>>   Subjective: Patient seen examined bedside, resting comfortably.  Lying in bed.  RN present.  Patient tearful about recurrent hospitalization.  Just discharged on 7/1 following extensive hospitalization for gram-negative bacteremia in which she was discharged on Levaquin.  Patient reports that she was compliant with the antibiotic without missed doses.  Patient did have her port removed during that hospitalization with placement of a left upper extremity PICC line due to her poor venous access with requirement of multiple lab draws by her outpatient providers.  Patient wishes she was able to travel to [place] on vacation with her family but understands that she cannot be there with them due to her now  recurrent fever.  Patient has no localized symptoms and denies any pain or redness surrounding her previous port site or current PICC site.  Patient denies headache, no neck stiffness, no visual changes, no headache, no chest pain, no palpitations, no nausea/vomiting/diarrhea, no night sweats, no focal weakness, no fatigue, no paresthesias.  No acute events overnight per nursing staff.  Objective: Vitals:   04/05/22 2305 04/05/22 2345 04/06/22 0410 04/06/22 0817  BP:  92/60 100/69   Pulse: 88 82 80   Resp: _0 Temp:  98.1 F (36.7 C) (!) 97.5 F (36.4 C)   TempSrc:  Oral Oral   SpO2: 99% 97% 94% 98%   No intake or output data in the 24 hours ending 04/06/22 0952 There were no vitals filed for this visit.  Examination:  Physical Exam: GEN: NAD, alert and oriented x 3, chronically ill appearance, appears older than stated age 12: NCAT, PERRL, EOMI, sclera clear, MMM, no nuchal rigidity PULM: CTAB w/o wheezes/crackles, normal respiratory effort, on 3 L nasal cannula which is her baseline CV: RRR w/o M/G/R GI: abd soft, NTND, NABS, no R/G/M MSK: no peripheral edema, moves all extremities independently, noted left index finger amputation NEURO: CN II-XII intact, no focal deficits, sensation to light touch intact PSYCH: normal mood/affect Integumentary: Noted previous right upper chest port site with dressing in place without any surrounding fluctuance/erythema, noted left upper extremity PICC site with dressing in place without erythema/fluctuance, otherwise no other concerning rashes/wounds/lesions    Data Reviewed: I have personally reviewed following labs and imaging studies  CBC: Recent Labs  Lab 03/31/22 0447 04/05/22 1854 04/06/22 0546  WBC 8.7 15.2* 11.0*  NEUTROABS  --  12.8*  --   HGB 8.1* 10.7* 8.9*  HCT 27.2* 34.9* 29.7*  MCV 80.2 77.2* 79.0*  PLT 282 378 623   Basic Metabolic Panel: Recent Labs  Lab 03/31/22 0447 04/05/22 1854 04/06/22 0546  NA  137 137 137  K 3.8 4.0 4.0  CL 108 106 112*  CO2 23 21* 18*  GLUCOSE 115* 111* 151*  BUN 19 34* 27*  CREATININE 0.87 1.53* 0.98  CALCIUM 8.9 9.4 8.0*   GFR: Estimated Creatinine Clearance: 71.1 mL/min (by C-G formula based on SCr of 0.98 mg/dL). Liver Function Tests: Recent Labs  Lab 04/05/22 1854  AST 29  ALT 43  ALKPHOS 86  BILITOT 0.6  PROT 8.3*  ALBUMIN 4.0   No results for input(s): "LIPASE", "AMYLASE" in the last 168 hours. No results for input(s): "AMMONIA" in the last 168 hours. Coagulation Profile: Recent Labs  Lab 04/05/22 1854 04/06/22 0546  INR 1.1 1.2   Cardiac Enzymes: No results for input(s): "CKTOTAL", "CKMB", "CKMBINDEX", "TROPONINI" in the last 168 hours. BNP (last 3 results) No results for input(s): "PROBNP" in the last 8760 hours. HbA1C: No results for input(s): "HGBA1C" in the last 72 hours. CBG: No results for input(s): "GLUCAP" in the last 168 hours. Lipid Profile: No results for input(s): "CHOL", "HDL", "LDLCALC", "TRIG", "CHOLHDL", "LDLDIRECT" in the last 72 hours. Thyroid Function Tests: No results for input(s): "TSH", "T4TOTAL", "FREET4", "T3FREE", "THYROIDAB" in the last 72 hours. Anemia Panel: No results for input(s): "VITAMINB12", "FOLATE", "FERRITIN", "TIBC", "IRON", "RETICCTPCT" in the last 72 hours. Sepsis Labs: Recent Labs  Lab 04/05/22 1854 04/06/22 0546  PROCALCITON  --  2.00  LATICACIDVEN 1.3  --     No results found for this or any previous visit (from the past 240 hour(s)).       Radiology Studies: DG Chest Portable 1 View  Result Date: 04/05/2022 CLINICAL DATA:  Fever, recent PICC placement EXAM: PORTABLE CHEST 1 VIEW COMPARISON:  03/28/2022 FINDINGS: Single frontal view of the chest demonstrates interval removal of the right-sided chest wall port. Left-sided PICC tip projects over superior vena cava. Cardiac silhouette is unremarkable. No airspace disease, effusion, or pneumothorax. No acute bony abnormalities.  IMPRESSION: 1. No acute intrathoracic process. Electronically Signed   By: Randa Ngo M.D.   On: 04/05/2022 21:32        Scheduled Meds:  aspirin  325 mg Oral QHS   bumetanide  1 mg Oral BID   buPROPion  150 mg Oral Daily   dexlansoprazole  60 mg Oral Daily   enoxaparin (LOVENOX) injection  40 mg Subcutaneous Q24H   escitalopram  20 mg Oral QPM   hydrocortisone  10 mg Oral QPM   hydrocortisone  20 mg Oral Daily   lamoTRIgine  100 mg Oral QHS   levothyroxine  100 mcg Oral q morning   linagliptin  5 mg Oral Daily   metoprolol succinate  37.5 mg Oral Daily   mometasone-formoterol  2 puff Inhalation BID   rosuvastatin  10 mg Oral Daily   saccharomyces boulardii  250 mg Oral BID   topiramate  150 mg Oral QHS   Continuous Infusions:  ceFEPime (MAXIPIME) IV 2 g (04/06/22 0901)   lactated ringers 75 mL/hr at 04/06/22 0815   metronidazole 500 mg (04/06/22 0000)   vancomycin       LOS: 1 day    Time spent: 56 minutes spent on chart review, discussion with nursing staff, consultants, updating family and interview/physical exam; more than 50%  of that time was spent in counseling and/or coordination of care.    Sarenity Ramaker J [place] DO [facility] Available via Epic secure chat 7am-7pm After these hours, please refer to coverage provider listed on [email or web address] 04/06/2022, 9:52 AM

## 2022-04-06 NOTE — Progress Notes (Signed)
Pharmacy Antibiotic Note  [name] is a 51 y.o. female admitted on 04/05/2022 with sepsis.  PMH significant for breast cancer, port-a-cath in place.  Noted recent blood culture of gram-negative bacilli and has been on Levaquin.  Pharmacy has been consulted for Vancomycin and Cefepime dosing.  In the ED patient received Vancomycin 1560m IV and Cefepime 2gm IV x 1 dose each.  Plan: Vancomycin 1000 mg IV Q 24 hrs. Goal AUC 400-550.  Expected AUC: 468.9  SCr used: 1.53 Cefepime 2gm IV q12h Metronidazole per MD Follow renal function F/u culture results and sensitivities     Temp (24hrs), Avg:99.4 F (37.4 C), Min:98.1 F (36.7 C), Max:102.1 F (38.9 C)  Recent Labs  Lab 03/30/22 0500 03/31/22 0447 04/05/22 1854  WBC 7.0 8.7 15.2*  CREATININE 1.01* 0.87 1.53*  LATICACIDVEN  --   --  1.3    Estimated Creatinine Clearance: 45.5 mL/min (A) (by C-G formula based on SCr of 1.53 mg/dL (H)).    Allergies  Allergen Reactions   Ferrous Bisglycinate Chelate [Iron] Anaphylaxis    Only IV    Mushroom Extract Complex Anaphylaxis   Na Ferric Gluc Cplx In Sucrose Anaphylaxis   Cymbalta [Duloxetine Hcl] Swelling and Anxiety   Phenergan [Promethazine]     Other reaction(s): Unknown   Promethazine Hcl     Other reaction(s): Unknown   Succinylcholine Other (See Comments)    Lock Jaw   Buprenorphine Hcl Hives   Compazine Other (See Comments)    Altered mental status Aggression   Metoclopramide Other (See Comments)    Dystonia   Morphine And Related Hives   Ondansetron Hives and Rash    Other reaction(s): Unknown Other reaction(s): Unknown   Promethazine Hcl Hives   Tegaderm Ag Mesh [Silver] Rash    Old formulation only, is able to tolerate new formulation    Antimicrobials this admission: 7/6 Cefepime >>   7/6 Vancomycin >>   7/7 Metronidazole >>  Dose adjustments this admission:    Microbiology results: 7/6 BCx:      Thank you for allowing pharmacy to be a part of  this patient's care.  PEverette Rank PharmD 04/06/2022 1:10 AM

## 2022-04-06 NOTE — Assessment & Plan Note (Signed)
-  We will continue antihypertensive

## 2022-04-06 NOTE — Assessment & Plan Note (Signed)
-  We will continue statin therapy. 

## 2022-04-06 NOTE — Assessment & Plan Note (Signed)
We will continue Lexapro and Lamictal as well as Xanax and Wellbutrin XL

## 2022-04-06 NOTE — Consult Note (Addendum)
[facility]                                                                                        Patient Identification: Patient Name: [name] MRN: [id number] [place] Date: 04/05/2022  5:56 PM Today's Date: 04/06/2022 Reason for consult: fevers Requesting provider: Eric [place]   Principal Problem:   Fever Active Problems:   Hypertension   Hyperlipidemia   Addison's disease ([place])   Anxiety and depression   GERD without esophagitis   Sepsis ([place])   Antibiotics:  Cefepime 7/6-c Vancomycin 7/6 Metronidazole 7/6  Lines/Hardware: Left arm PICC+  Assessment # Fevers with concerns for PICC infection in the setting of prior h/o line infection - Prior site of rt chest port removal with no swelling/redness and fluctuance - left arm PICC with no swelling/redness/fluctuance but some tenderness noted by patient - 7/6 UA and Chest Xray unremarkable   # Recent Enterobacter aerogenes/pantoea agglomerans bacteremia:  Meropenem 6/28 >cefepime6/29>levofloxacin 7/1-7/6>cefepime 7/6-c  # H/o Addison's disease with adrenal insufficiency: on hydrocortisone # H/o Breast cancer, NHL   Recommendations  Follow-up blood cultures 7/6 Continue cefepime for prior  bacteremia Will add vancomycin, pharmacy to dose pending blood cultures given PICC and mention of some pain at PICC by patient.  Ultrasound of left upper extremity to rule out DVT Monitor CBC, BMP and vancomycin trough  Addendum: patient well known to Dr Baxter Flattery, recommends to have PICC line removed as there is no clear indication for continued need of PICC except patient's reported need of IV benadryl round the clock for chronic nausea.   Dr. Gale Journey covering this weekend. I am back Monday  Rest of the management as per the primary team. Please call with questions or concerns.  Thank you for the consult  [name], MD Infectious  Disease Physician [facility] Infect[facility] [address] [place], [place] [address] Phone: [phone]  Fax: [phone]  __________________________________________________________________________________________________________ HPI and Hospital Course: 51 year old female with PMH of Addison's disease with adrenal insufficiency, aortic stenosis, breast cancer status post bilateral mastectomy status post reconstruction, cervical cancer, CHF, chronic respiratory failure on home O2, GERD, depression, Hodgkin lymphoma status post mantle radiation, hypertension, osteoporosis, pulmonary hypertension, Raynaud's phenomenon, seizures, sleep apnea, thyroid cancer status post surgical removal who presented to the ED 7/6 with fevers.  Patient was recently discharged from [facility] 7/1 for Enterobacter aerogenes/ pantoae agglomerans bacteremia.  Seen by ID and subsequently underwent right chest port removal given concern for line associated bacteremia followed by left arm PICC line placement due to continued need for IV medication at home.  Patient was on IV meropenem in the hospital which was transitioned to cefepime then levofloxacin 750 mg p.o. daily for 10 days from date of port removal.  She has prior history of mild infections/bacteremia.   After hospital discharge, she was doing well until Wednesday night when she started having fever and continued to have fever with Tmax 103 on Thursday with chills and hence decided to come to the ED. denies any pain, tenderness or discharge from her site of right chest where port  was removed.  She does not have any issues with her left arm PICC but complains of pain in the middle of the PICC line when pressed.  Denies any swelling or redness.  Complains of chronic nausea for which she needs to take IV Benadryl around-the-clock.  Denies vomiting, abdominal pain or diarrhea.  Cough has recently worsened but denies any phlegm, shortness of  breath.  Denies any GU symptoms or rashes.  Generalized joint pain with no localization.   ROS: All systems reviewed with pertinent positive and negative as listed above  Past Medical History:  Diagnosis Date   Addison's disease ([place])    Adrenal insufficiency (HCC)    Anemia    Anxiety    Aortic stenosis    Aortic stenosis    Appendicitis 12/19/09   Appendicitis    Breast cancer (HCC)    STATUS POST BILATERAL MASTECTOMY. STATUS POST RECONSTRUCTION. SHE HAD SILICONE BREAST IMPLANTS AND THE LEFT IMPLANT IS LEAKING SLIGHTLY   Cellulitis of right middle finger 11/07/2018   Cervical cancer ([place]) 12/23/2018   Chest pain    CHF with right heart failure ([place]) 04/17/2017   Chronic respiratory failure with hypoxia (HCC) 12/23/2018   Cough variant asthma 04/13/2019   Depression    GERD (gastroesophageal reflux disease)    takes Dexilant and carafate and gi coctail    Headache    migraines on a daily and monthly regimen    Heart murmur    History of kidney stones    Hodgkin lymphoma ([place])    STATUS POST MANTLE RADIATION   Hodgkin's lymphoma ([place])    1987   Hypertension    Hypoxia    Necrotizing fasciitis ([place]) 12/23/2018   Non-ischemic cardiomyopathy ([place])    Osteoporosis    Palpitations    Pituitary adenoma ([place]) 12/23/2018   Pneumonia    PONV (postoperative nausea and vomiting)    Pre-diabetes    per pt; no meds   Pulmonary hypertension ([place]) 12/23/2018   Raynaud phenomenon    Right heart failure ([place]) 04/17/2017   Seizures ([place])    last febrile seizure was approx 3 weeks ago per report on 12/01/2020   Sleep apnea    upcoming sleep study per pt    Supplemental oxygen dependent    3 liters   SVT (supraventricular tachycardia) (HCC)    Tachycardia    Thyroid cancer ([place])    STATUS POST SURGICAL REMOVAL-CURRENT ON THYROID REPLACEMENT   Past Surgical History:  Procedure Laterality Date   ABDOMINAL HYSTERECTOMY     AMPUTATION Left 01/30/2019   Procedure: Left Index finger amputation  with flap reconstruction and repair reconstruction;  Surgeon: Roseanne Kaufman, MD;  Location: [facility];  Service: Orthopedics;  Laterality: Left;   APPENDECTOMY     breast implants and removal      breast implants but leaking      CARDIAC CATHETERIZATION  05/18/09   NORMAL CATH   COLONOSCOPY     hx of chemotherapy      hx of radiation therapy      I & D EXTREMITY Left 12/23/2018   Procedure: IRRIGATION AND DEBRIDEMENT HAND / INDEX FINGER;  Surgeon: Roseanne Kaufman, MD;  Location: [facility];  Service: Orthopedics;  Laterality: Left;   IR CV LINE INJECTION  03/22/2022   IR IMAGING GUIDED PORT INSERTION  05/06/2020   IR IMAGING GUIDED PORT INSERTION  12/04/2021   IR REMOVAL TUN ACCESS W/ PORT W/O FL MOD SED  04/27/2020   IR  REMOVAL TUN ACCESS W/ PORT W/O FL MOD SED  12/04/2021   IR REMOVAL TUN ACCESS W/ PORT W/O FL MOD SED  03/30/2022   KIDNEY STONE SURGERY     LUMBAR PUNCTURE W/ INTRATHECAL CHEMOTHERAPY     MASTECTOMY     PITUITARY SURGERY     RIGHT/LEFT HEART CATH AND CORONARY ANGIOGRAPHY N/A 04/02/2018   Procedure: RIGHT/LEFT HEART CATH AND CORONARY ANGIOGRAPHY;  Surgeon: Burnell Blanks, MD;  Location: [facility];  Service: Cardiovascular;  Laterality: N/A;   TOOTH EXTRACTION N/A 12/05/2020   Procedure: DENTAL RESTORATION/EXTRACTIONS;  Surgeon: Ronal Fear, MD;  Location: [facility];  Service: Oral Surgery;  Laterality: N/A;   TOTAL THYROIDECTOMY     VIDEO BRONCHOSCOPY Bilateral 11/14/2018   Procedure: VIDEO BRONCHOSCOPY WITHOUT FLUORO;  Surgeon: Margaretha Seeds, MD;  Location: [facility];  Service: Cardiopulmonary;  Laterality: Bilateral;   VIDEO BRONCHOSCOPY WITH ENDOBRONCHIAL ULTRASOUND N/A 11/19/2018   Procedure: VIDEO BRONCHOSCOPY WITH ENDOBRONCHIAL ULTRASOUND;  Surgeon: Margaretha Seeds, MD;  Location: [facility];  Service: Thoracic;  Laterality: N/A;     Scheduled Meds:  aspirin  325 mg Oral QHS   bumetanide  1 mg Oral BID   buPROPion  150 mg Oral Daily   dexlansoprazole   60 mg Oral Daily   enoxaparin (LOVENOX) injection  40 mg Subcutaneous Q24H   escitalopram  20 mg Oral QPM   hydrocortisone  10 mg Oral QPM   hydrocortisone  20 mg Oral Daily   lamoTRIgine  100 mg Oral QHS   levothyroxine  100 mcg Oral q morning   linagliptin  5 mg Oral Daily   metoprolol succinate  37.5 mg Oral Daily   mometasone-formoterol  2 puff Inhalation BID   rosuvastatin  10 mg Oral Daily   saccharomyces boulardii  250 mg Oral BID   topiramate  150 mg Oral QHS   Continuous Infusions:  ceFEPime (MAXIPIME) IV 2 g (04/06/22 0901)   metronidazole 500 mg (04/06/22 0000)   vancomycin     PRN Meds:.acetaminophen **OR** acetaminophen, albuterol, ALPRAZolam, diclofenac, diphenhydrAMINE, dronabinol, EPINEPHrine, magnesium hydroxide, Rimegepant Sulfate, sucralfate, traZODone, zolpidem  Allergies  Allergen Reactions   Ferrous Bisglycinate Chelate [Iron] Anaphylaxis    Only IV    Mushroom Extract Complex Anaphylaxis   Na Ferric Gluc Cplx In Sucrose Anaphylaxis   Cymbalta [Duloxetine Hcl] Swelling and Anxiety   Phenergan [Promethazine]     Other reaction(s): Unknown   Promethazine Hcl     Other reaction(s): Unknown   Succinylcholine Other (See Comments)    Lock Jaw   Buprenorphine Hcl Hives   Compazine Other (See Comments)    Altered mental status Aggression   Metoclopramide Other (See Comments)    Dystonia   Morphine And Related Hives   Ondansetron Hives and Rash    Other reaction(s): Unknown Other reaction(s): Unknown   Promethazine Hcl Hives   Tegaderm Ag Mesh [Silver] Rash    Old formulation only, is able to tolerate new formulation   Social History   Socioeconomic History   Marital status: Married    Spouse name: Not on file   Number of children: Not on file   Years of education: Not on file   Highest education level: Not on file  Occupational History   Not on file  Tobacco Use   Smoking status: Never   Smokeless tobacco: Never  Vaping Use   Vaping Use:  Never used  Substance and Sexual Activity   Alcohol use: Not  Currently    Comment: social    Drug use: No   Sexual activity: Yes    Birth control/protection: Surgical  Other Topics Concern   Not on file  Social History Narrative   Not on file   Social Determinants of Health   Financial Resource Strain: Not on file  Food Insecurity: Not on file  Transportation Needs: Not on file  Physical Activity: Not on file  Stress: Not on file  Social Connections: Not on file  Intimate Partner Violence: Not on file   Family History  Family history unknown: Yes    Vitals  BP 100/69 (BP Location: Right Arm)   Pulse 80   Temp (!) 97.5 F (36.4 C) (Oral)   Resp 20   LMP  (LMP Unknown)   SpO2 98%   Physical Exam Constitutional: On nasal cannula, tearful and frustrated that she is back in the hospital and missed her vacation    Comments:   Cardiovascular:     Rate and Rhythm: Normal rate and regular rhythm.     Heart sounds: Systolic murmur, Rt chest port removal site with no swelling/erythema and fluctuance.   Pulmonary:     Effort: Pulmonary effort is normal on nasal cannula    Comments: Coarse breath sounds  Abdominal:     Palpations: Abdomen is soft.     Tenderness: Nondistended and nontender  Musculoskeletal:        General: No swelling or tenderness. Left index finger amputation  Skin:    Comments: No obvious rashes. Left arm PICC with mild tenderness but no swelling/erythema or fluctuance  Neurological:     General: Grossly nonfocal, awake alert and oriented   Pertinent Microbiology Results for orders placed or performed in visit on 03/26/22  Blood culture (routine single)     Status: Abnormal   Collection Time: 03/26/22  2:14 AM   Specimen: Blood  Result Value Ref Range Status   MICRO NUMBER: [id number]  Final   SPECIMEN QUALITY: Adequate  Final   Source BLOOD 1  Final   STATUS: FINAL  Final   ISOLATE 1: Enterobacter aerogenes (AA)  Final    Comment:  Klebsiella aerogenes (Enterobacter) , from anaerobic bottle only   ISOLATE 2: Pantoea agglomerans (AA)  Final    Comment: Pantoea agglomerans from aerobic bottle only   COMMENT: Aerobic and anaerobic bottle received.  Final      Susceptibility   Enterobacter aerogenes - BLOOD CULTURE, NEGATIVE 1    AMOX/CLAVULANIC >=32 Resistant     CEFAZOLIN* >=64 Resistant      * For infections other than uncomplicated UTI caused by E. coli, K. pneumoniae or P. mirabilis: Cefazolin is resistant if MIC > or = 8 mcg/mL. (Distinguishing susceptible versus intermediate for isolates with MIC < or = 4 mcg/mL requires additional testing.)     CEFTAZIDIME <=1 Sensitive     CEFEPIME <=1 Sensitive     CEFTRIAXONE <=1 Sensitive     CIPROFLOXACIN <=0.25 Sensitive     LEVOFLOXACIN <=0.12 Sensitive     GENTAMICIN <=1 Sensitive     IMIPENEM 1 Sensitive     PIP/TAZO <=4 Sensitive     TOBRAMYCIN <=1 Sensitive     TRIMETH/SULFA <=20 Sensitive    Pantoea agglomerans - BLOOD CULTURE, NEGATIVE 2    AMOX/CLAVULANIC 4 Sensitive     CEFAZOLIN <=4 Not Reportable     CEFTAZIDIME <=1 Sensitive     CEFEPIME <=1 Sensitive     CEFTRIAXONE <=1 Sensitive  CIPROFLOXACIN <=0.25 Sensitive     LEVOFLOXACIN <=0.12 Sensitive     GENTAMICIN <=1 Sensitive     IMIPENEM <=0.25 Sensitive     PIP/TAZO 64 Intermediate     TOBRAMYCIN <=1 Sensitive     TRIMETH/SULFA* <=20 Sensitive      * For infections other than uncomplicated UTI caused by E. coli, K. pneumoniae or P. mirabilis: Cefazolin is resistant if MIC > or = 8 mcg/mL. (Distinguishing susceptible versus intermediate for isolates with MIC < or = 4 mcg/mL requires additional testing.) Legend: S = Susceptible  I = Intermediate R = Resistant  NS = Not susceptible * = Not tested  NR = Not reported **NN = See antimicrobic comments   Blood culture (routine single)     Status: Abnormal   Collection Time: 03/26/22  2:14 AM   Specimen: Blood  Result Value Ref Range Status    MICRO NUMBER: [id number]  Final   SPECIMEN QUALITY: Suboptimal  Final   Source BLOOD 2  Final   STATUS: FINAL  Final   Result:   Final    Inspection of blood culture bottles indicates that an inadequate volume of blood may have been collected for the detection of sepsis.   ISOLATE 1: Enterobacter aerogenes (AA)  Final    Comment: Klebsiella aerogenes (Enterobacter) from aerobic bottle only   COMMENT: Aerobic and anaerobic bottle received.  Final      Susceptibility   Enterobacter aerogenes - BLOOD CULTURE, NEGATIVE 1    AMOX/CLAVULANIC >=32 Resistant     CEFAZOLIN* >=64 Resistant      * For infections other than uncomplicated UTI caused by E. coli, K. pneumoniae or P. mirabilis: Cefazolin is resistant if MIC > or = 8 mcg/mL. (Distinguishing susceptible versus intermediate for isolates with MIC < or = 4 mcg/mL requires additional testing.)     CEFTAZIDIME <=1 Sensitive     CEFEPIME <=1 Sensitive     CEFTRIAXONE <=1 Sensitive     CIPROFLOXACIN <=0.25 Sensitive     LEVOFLOXACIN <=0.12 Sensitive     GENTAMICIN <=1 Sensitive     PIP/TAZO <=4 Sensitive     TOBRAMYCIN <=1 Sensitive     TRIMETH/SULFA* <=20 Sensitive      * For infections other than uncomplicated UTI caused by E. coli, K. pneumoniae or P. mirabilis: Cefazolin is resistant if MIC > or = 8 mcg/mL. (Distinguishing susceptible versus intermediate for isolates with MIC < or = 4 mcg/mL requires additional testing.) Legend: S = Susceptible  I = Intermediate R = Resistant  NS = Not susceptible * = Not tested  NR = Not reported **NN = See antimicrobic comments    Pertinent Lab seen by me:    Latest Ref Rng & Units 04/06/2022    5:46 AM 04/05/2022    6:54 PM 03/31/2022    4:47 AM  CBC  WBC 4.0 - 10.5 K/uL 11.0  15.2  8.7   Hemoglobin 12.0 - 15.0 g/dL 8.9  10.7  8.1   Hematocrit 36.0 - 46.0 % 29.7  34.9  27.2   Platelets 150 - 400 K/uL 284  378  282       Latest Ref Rng & Units 04/06/2022    5:46 AM 04/05/2022    6:54 PM  03/31/2022    4:47 AM  CMP  Glucose 70 - 99 mg/dL 151  111  115   BUN 6 - 20 mg/dL 27  34  19   Creatinine 0.44 - 1.00 mg/dL  0.98  1.53  0.87   Sodium 135 - 145 mmol/L 137  137  137   Potassium 3.5 - 5.1 mmol/L 4.0  4.0  3.8   Chloride 98 - 111 mmol/L 112  106  108   CO2 22 - 32 mmol/L _0 Calcium 8.9 - 10.3 mg/dL 8.0  9.4  8.9   Total Protein 6.5 - 8.1 g/dL  8.3    Total Bilirubin 0.3 - 1.2 mg/dL  0.6    Alkaline Phos 38 - 126 U/L  86    AST 15 - 41 U/L  29    ALT 0 - 44 U/L  43       Pertinent Imagings/Other Imagings Plain films and CT images have been personally visualized and interpreted; radiology reports have been reviewed. Decision making incorporated into the Impression / Recommendations.  DG Chest Portable 1 View  Result Date: 04/05/2022 CLINICAL DATA:  Fever, recent PICC placement EXAM: PORTABLE CHEST 1 VIEW COMPARISON:  03/28/2022 FINDINGS: Single frontal view of the chest demonstrates interval removal of the right-sided chest wall port. Left-sided PICC tip projects over superior vena cava. Cardiac silhouette is unremarkable. No airspace disease, effusion, or pneumothorax. No acute bony abnormalities. IMPRESSION: 1. No acute intrathoracic process. Electronically Signed   By: Randa Ngo M.D.   On: 04/05/2022 21:32   IR PICC PLACEMENT LEFT >5 YRS INC IMG GUIDE  Result Date: 03/30/2022 CLINICAL DATA:  Bacteremia and need to remove indwelling right chest Port-A-Cath. Request to also place a PICC line for further IV access needs. EXAM: 1. LEFT UPPER EXTREMITY PICC LINE PLACEMENT WITH ULTRASOUND AND FLUOROSCOPIC GUIDANCE 2. REMOVAL OF RIGHT CHEST TUNNELED CENTRAL VENOUS PORT-A-CATH FLUOROSCOPY: 1.0 mGy MEDICATIONS: Moderate (conscious) sedation was employed during this procedure. A total of Versed 2.0 mg and Fentanyl 150 mcg was administered intravenously by radiology nursing. Moderate Sedation Time: 43 minutes. The patient's level of consciousness and vital signs were  monitored continuously by radiology nursing throughout the procedure under my direct supervision. PROCEDURE: The patient was advised of the possible risks and complications and agreed to undergo the procedure. The patient was then brought to the angiographic suite for the procedure. A time-out was performed prior to initiating the procedure. The left arm was prepped with chlorhexidine, draped in the usual sterile fashion using maximum barrier technique (cap and mask, sterile gown, sterile gloves, large sterile sheet, hand hygiene and cutaneous antisepsis) and infiltrated locally with 1% Lidocaine. Ultrasound demonstrated patency of the left brachial vein, and this was documented with an image. Under real-time ultrasound guidance, this vein was accessed with a 21 gauge micropuncture needle and image documentation was performed. A 0.018 wire was introduced in to the vein. Over this, a 5.0 [place] single lumen power injectable PICC was advanced to the lower SVC/right atrial junction. Fluoroscopy during the procedure and fluoro spot radiograph confirms appropriate catheter position. The catheter was flushed and covered with a sterile dressing. Catheter length: 43 cm The right chest port site was prepped with chlorhexidine. Local anesthesia was provided with 1% lidocaine. Utilizing sharp and blunt dissection, the subcutaneous chest port and attached catheter were removed in their entirety. The port pocket was irrigated with sterile saline. The wound was closed with subcutaneous 3-0 Monocryl and subcuticular 4-0 Vicryl. Dermabond was applied to the incision. FINDINGS: The port pocket was clean in appearance and not overtly infected. COMPLICATIONS: None IMPRESSION: 1. Placement single-lumen power injectable PICC line via left brachial vein with catheter tip  at SVC/RA junction. 2. Removal of implanted right chest Port-A-Cath utilizing sharp and blunt dissection. The wound was closed. Electronically Signed   By: Aletta Edouard M.D.   On: 03/30/2022 17:21   IR REMOVAL TUN ACCESS W/ PORT W/O FL MOD SED  Result Date: 03/30/2022 CLINICAL DATA:  Bacteremia and need to remove indwelling right chest Port-A-Cath. Request to also place a PICC line for further IV access needs. EXAM: 1. LEFT UPPER EXTREMITY PICC LINE PLACEMENT WITH ULTRASOUND AND FLUOROSCOPIC GUIDANCE 2. REMOVAL OF RIGHT CHEST TUNNELED CENTRAL VENOUS PORT-A-CATH FLUOROSCOPY: 1.0 mGy MEDICATIONS: Moderate (conscious) sedation was employed during this procedure. A total of Versed 2.0 mg and Fentanyl 150 mcg was administered intravenously by radiology nursing. Moderate Sedation Time: 43 minutes. The patient's level of consciousness and vital signs were monitored continuously by radiology nursing throughout the procedure under my direct supervision. PROCEDURE: The patient was advised of the possible risks and complications and agreed to undergo the procedure. The patient was then brought to the angiographic suite for the procedure. A time-out was performed prior to initiating the procedure. The left arm was prepped with chlorhexidine, draped in the usual sterile fashion using maximum barrier technique (cap and mask, sterile gown, sterile gloves, large sterile sheet, hand hygiene and cutaneous antisepsis) and infiltrated locally with 1% Lidocaine. Ultrasound demonstrated patency of the left brachial vein, and this was documented with an image. Under real-time ultrasound guidance, this vein was accessed with a 21 gauge micropuncture needle and image documentation was performed. A 0.018 wire was introduced in to the vein. Over this, a 5.0 [place] single lumen power injectable PICC was advanced to the lower SVC/right atrial junction. Fluoroscopy during the procedure and fluoro spot radiograph confirms appropriate catheter position. The catheter was flushed and covered with a sterile dressing. Catheter length: 43 cm The right chest port site was prepped with chlorhexidine. Local  anesthesia was provided with 1% lidocaine. Utilizing sharp and blunt dissection, the subcutaneous chest port and attached catheter were removed in their entirety. The port pocket was irrigated with sterile saline. The wound was closed with subcutaneous 3-0 Monocryl and subcuticular 4-0 Vicryl. Dermabond was applied to the incision. FINDINGS: The port pocket was clean in appearance and not overtly infected. COMPLICATIONS: None IMPRESSION: 1. Placement single-lumen power injectable PICC line via left brachial vein with catheter tip at SVC/RA junction. 2. Removal of implanted right chest Port-A-Cath utilizing sharp and blunt dissection. The wound was closed. Electronically Signed   By: Aletta Edouard M.D.   On: 03/30/2022 17:21   DG Chest 2 View  Result Date: 03/28/2022 CLINICAL DATA:  Fever cancer patient EXAM: CHEST - 2 VIEW COMPARISON:  03/22/2022, 01/23/2021, CT chest 04/04/2020, CT abdomen pelvis 07/15/2020 FINDINGS: Right-sided central venous port tip at the cavoatrial region. No acute airspace disease or effusion. Stable cardiomediastinal silhouette with aortic atherosclerosis. No pneumothorax. Clips in the bilateral axilla. IMPRESSION: No active cardiopulmonary disease. Electronically Signed   By: Donavan Foil M.D.   On: 03/28/2022 23:52   IR CV Line Injection  Result Date: 03/23/2022 INDICATION: Port site check. EXAM: ESTABLISHED PATIENT OFFICE VISIT CHIEF COMPLAINT: Please see epic note for full details. HISTORY OF PRESENT ILLNESS: Please see epic note for full details. REVIEW OF SYSTEMS: Please see epic note for full details. PHYSICAL EXAMINATION: Please see epic note for full details. ASSESSMENT AND PLAN: Please see epic note for full details. Electronically Signed   By: Ruthann Cancer M.D.   On: 03/23/2022 16:38   DG  Chest Port 1 View  Result Date: 03/22/2022 CLINICAL DATA:  Port-A-Cath in place EXAM: PORTABLE CHEST 1 VIEW COMPARISON:  Radiograph 01/23/2021 FINDINGS: Left-sided port is been  removed. Interval placement of a right chest port with catheter tip overlying the right atrium. Unchanged cardiomediastinal silhouette. There is no focal airspace consolidation. There is no pleural effusion. No pneumothorax. There is no acute osseous abnormality. IMPRESSION: No evidence of acute cardiopulmonary disease. Right chest port catheter tip overlies the right atrium. Electronically Signed   By: Maurine Simmering M.D.   On: 03/22/2022 16:43    I spent more than 80 minutes for this patient encounter including review of prior medical records/discussing diagnostics and treatment plan with the patient/family/coordinate care with primary/other specialits with greater than 50% of time in face to face encounter.   Electronically signed by:   [name], MD Infectious Disease Physician [facility] Infectious Disease Pager: [phone]

## 2022-04-06 NOTE — Assessment & Plan Note (Signed)
-  She was given stress dose of steroids and will be continued on her regular home dose while monitoring her blood pressure.

## 2022-04-06 NOTE — Progress Notes (Signed)
Left upper extremity venous duplex has been completed. Preliminary results can be found in CV Proc through chart review.   04/06/22 2:51 PM [name] RVT

## 2022-04-06 NOTE — Assessment & Plan Note (Signed)
-  We will continue PPI therapy

## 2022-04-06 NOTE — Assessment & Plan Note (Addendum)
-  This is concerning for recurrent bacteremia and sepsis especially given associated leukocytosis with tachycardia and tachypnea as well as fever. - The patient will be admitted to a telemetry bed. - We will continue antibiotic therapy with broad-spectrum coverage with IV vancomycin, cefepime and Flagyl. - ID consult will likely be needed.

## 2022-04-07 ENCOUNTER — Inpatient Hospital Stay (HOSPITAL_COMMUNITY): Payer: BC Managed Care – PPO

## 2022-04-07 DIAGNOSIS — F419 Anxiety disorder, unspecified: Secondary | ICD-10-CM | POA: Diagnosis not present

## 2022-04-07 DIAGNOSIS — E271 Primary adrenocortical insufficiency: Secondary | ICD-10-CM | POA: Diagnosis not present

## 2022-04-07 DIAGNOSIS — Z452 Encounter for adjustment and management of vascular access device: Secondary | ICD-10-CM

## 2022-04-07 DIAGNOSIS — R509 Fever, unspecified: Secondary | ICD-10-CM | POA: Diagnosis not present

## 2022-04-07 DIAGNOSIS — K219 Gastro-esophageal reflux disease without esophagitis: Secondary | ICD-10-CM | POA: Diagnosis not present

## 2022-04-07 LAB — BASIC METABOLIC PANEL
Anion gap: 9 (ref 5–15)
BUN: 29 mg/dL — ABNORMAL HIGH (ref 6–20)
CO2: 23 mmol/L (ref 22–32)
Calcium: 8.3 mg/dL — ABNORMAL LOW (ref 8.9–10.3)
Chloride: 107 mmol/L (ref 98–111)
Creatinine, Ser: 1.04 mg/dL — ABNORMAL HIGH (ref 0.44–1.00)
GFR, Estimated: >60 mL/min (ref >60)
Glucose, Bld: 135 mg/dL — ABNORMAL HIGH (ref 70–99)
Potassium: 3.6 mmol/L (ref 3.5–5.1)
Sodium: 139 mmol/L (ref 135–145)

## 2022-04-07 LAB — CBC
HCT: 30.2 % — ABNORMAL LOW (ref 36.0–46.0)
Hemoglobin: 9 g/dL — ABNORMAL LOW (ref 12.0–15.0)
MCH: 23.6 pg — ABNORMAL LOW (ref 26.0–34.0)
MCHC: 29.8 g/dL — ABNORMAL LOW (ref 30.0–36.0)
MCV: 79.1 fL — ABNORMAL LOW (ref 80.0–100.0)
Platelets: 308 10*3/uL (ref 150–400)
RBC: 3.82 MIL/uL — ABNORMAL LOW (ref 3.87–5.11)
RDW: 17.6 % — ABNORMAL HIGH (ref 11.5–15.5)
WBC: 9.3 10*3/uL (ref 4.0–10.5)
nRBC: 0 % (ref 0.0–0.2)

## 2022-04-07 MED ORDER — LORAZEPAM 2 MG/ML IJ SOLN
1.0000 mg | INTRAMUSCULAR | Status: AC
Start: 2022-04-07 — End: 2022-04-07
  Administered 2022-04-07: 1 mg via INTRAVENOUS
  Filled 2022-04-07: qty 1

## 2022-04-07 NOTE — Progress Notes (Signed)
Id brief note  Patient with picc that she uses for iv benadryl and recent bacteremia with gram negative (enterobacter and pantoa -- picc left in place) admitted with fever and pain at picc site   Bcx so far negative Sepsis resolved on empiric abx vanc/cefepime/flagyl    A/p Sepsis Picc concern for infection associated with it Recent gram negative sepsis s/p tx Addison's disease  No sign of adrenal crisis on admission   Agree picc needs to come out given risk for ongoing bacteremia and no absolute need  If tomorrow bcx remains negative can d/c vancomycin Will decide on cefepime/metronidazole in another 2-3 days as cultures more finalized  Discussed with primary team

## 2022-04-07 NOTE — TOC Initial Note (Signed)
Transition of Care [facility]) - Initial/Assessment Note    Patient Details  Name: [name] MRN: [id number] Date of Birth: Aug 30, 1971  Transition of Care [facility]) CM/SW Contact:    Leeroy Cha, RN Phone Number: 04/07/2022, 10:35 AM  Clinical Narrative:                  Transition of Care [facility]) Screening Note   Patient Details  Name: [name] Date of Birth: [birth year]   Transition of Care [facility]) CM/SW Contact:    Leeroy Cha, RN Phone Number: 04/07/2022, 10:35 AM    Transition of Care Department [facility]) has reviewed patient and no TOC needs have been identified at this time. We will continue to monitor patient advancement through interdisciplinary progression rounds. If new patient transition needs arise, please place a TOC consult.    Expected Discharge Plan: Home/Self Care Barriers to Discharge: Continued Medical Work up   Patient Goals and CMS Choice Patient states their goals for this hospitalization and ongoing recovery are:: to go home CMS Medicare.gov Compare Post Acute Care list provided to:: Patient    Expected Discharge Plan and Services Expected Discharge Plan: Home/Self Care   Discharge Planning Services: CM Consult   Living arrangements for the past 2 months: Single Family Home                                      Prior Living Arrangements/Services Living arrangements for the past 2 months: Single Family Home Lives with:: Spouse Patient language and need for interpreter reviewed:: Yes Do you feel safe going back to the place where you live?: Yes            Criminal Activity/Legal Involvement Pertinent to Current Situation/Hospitalization: No - Comment as needed  Activities of Daily Living Home Assistive Devices/Equipment: Eyeglasses, Oxygen ADL Screening (condition at time of admission) Patient's cognitive ability adequate to safely complete daily activities?: Yes Is the patient deaf or have difficulty hearing?: No Does  the patient have difficulty seeing, even when wearing glasses/contacts?: No Does the patient have difficulty concentrating, remembering, or making decisions?: No Patient able to express need for assistance with ADLs?: Yes Does the patient have difficulty dressing or bathing?: No Independently performs ADLs?: Yes (appropriate for developmental age) Does the patient have difficulty walking or climbing stairs?: No Weakness of Legs: None Weakness of Arms/Hands: None  Permission Sought/Granted                  Emotional Assessment Appearance:: Appears stated age     Orientation: : Oriented to Place, Oriented to Self, Oriented to  Time, Oriented to Situation Alcohol / Substance Use: Not Applicable Psych Involvement: No (comment)  Admission diagnosis:  Fever [R50.9] PICC (peripherally inserted central catheter) in place [Z45.2] Fever, unspecified fever cause [R50.9] Leukocytosis, unspecified type [D72.829] Patient Active Problem List   Diagnosis Date Noted   Anxiety and depression 04/06/2022   GERD without esophagitis 04/06/2022   Sepsis ([place]) 04/06/2022   PICC (peripherally inserted central catheter) in place    Bacteremia due to Enterobacter species 03/29/2022   Normocytic anemia 03/29/2022   Major depressive disorder, recurrent episode, moderate (HCC)    Generalized anxiety disorder    Seizure-like activity ([place]) 12/08/2020   High anion gap metabolic acidosis 41/66/0630   Hypokalemia 12/08/2020   AKI (acute kidney injury) ([place]) 04/27/2020   Vasculopathy 04/27/2020   Bacteremia 04/19/2020  Addison's disease ([place])    Nausea 04/01/2020   Generalized weakness 02/10/2020   Headache 02/10/2020   Subcutaneous nodule 02/01/2020   Port-A-Cath in place 09/09/2019   Moderate persistent asthma without complication 25/27/1292   Fever 07/21/2019   Superficial thrombophlebitis 07/16/2019   Injury of left index finger 12/24/2018   History of pituitary adenoma 12/23/2018   History  of cervical cancer 12/23/2018   Chronic respiratory failure with hypoxia (HCC) 12/23/2018   SOB (shortness of breath)    Nephrolithiasis 11/07/2018   Oxygen dependent 11/07/2018   Migraine 11/07/2018   PVC's (premature ventricular contractions) 10/27/2018   Hyperlipidemia 03/09/2014   Hx of Hodgkins lymphoma    History of ductal carcinoma in situ (DCIS) of breast    History of thyroid cancer    Adrenal insufficiency ([place])    Hypertension    Raynaud phenomenon    GERD (gastroesophageal reflux disease)    PCP:  Nicholas Lose, MD Pharmacy:   [id number] [id number]: [phone]: [phone]    Social Determinants of Health (SDOH) Interventions    Readmission Risk Interventions    04/27/2020    1:19 PM  Readmission Risk Prevention Plan  Transportation Screening Complete  PCP or Specialist Appt within 3-5 Days Complete  HRI or [place]  Social Work Consult for RK[place]reanofPlanning/Counseling Complete  Palliative Care Screening Not Applicable  Medication Review (Press photographer Complete

## 2022-04-07 NOTE — Progress Notes (Signed)
PROGRESS NOTE    [name]  DXI:[id number] DOB: Apr 24, 1971 DOA: 04/05/2022 PCP: Nicholas Lose, MD    Brief Narrative:   [name] is a 51 y.o. 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  Reported fever at home 103.9.  Patient reports cough without wheezing or dyspnea.  Also with occasional nausea without vomiting/diarrhea.  Denies chest pains, no palpitations, no dysuria, hematuria, no flank pain, no urinary urgency.  Recently admitted at [facility] for bacteremia due to Enterobacter species and discharged on 03/31/2022 with continue antibiotics with Levaquin 750 mg daily for 10 days from the time of her port removal.  During the hospitalization she had a PICC line placed in her left arm for history of poor venous access and need of multiple blood draws from her outpatient providers.  Patient reports compliance with the Levaquin and no missed doses.  No sick contacts or other changes in home medications.  In the ED, temperature 102.1 F, HR 110, RR 29, BP 110/68, SPO2 100% on 3L [place] which is her baseline requirement.  WBC 15.2, hemoglobin 10.7, platelets 378.  Sodium 137, potassium 4.0, chloride 106, CO2 21, glucose 111, BUN 34, creatinine 1.53.  Lactic acid 1.3.  INR 1.2.  Urinalysis unrevealing.  Chest x-ray with no acute cardiopulmonary disease process.  Patient was started on  IV vancomycin/cefepime.  Given 2.5 L IV normal saline bolus, Tylenol 650 mg p.o. x1, Ativan 0.5 mg IV x1 and Solu-Medrol 40 mg IV.  TRH consulted for further evaluation and management of fever concerning for sepsis with recurrent bacteremia.  Assessment & Plan:   Sepsis, POA Recent Enterobacter aerogenes bacteremia Patient presenting to the ED with recurrent fever up to 103.9 at home.  On arrival to the ED temperature 102.1 F with tachycardia, tachypnea and elevated WBC count of 15.2 with evidence of acute endorgan damage with renal failure with creatinine 1.53 on admission.  Procalcitonin elevated 2.00.  Chest x-ray and urinalysis unrevealing.  Previous port site with dressing in place and new PICC line site left upper extremity with no concerning fluctuance/erythema.  --ID consulted --WBC 15.2>11.0>9.3 --PCT 2.00 --Blood cultures x 2: No growth x2 days --Vancomycin, pharmacy consulted for dosing/monitoring --Cefepime --Supportive care, antipyretics --CBC daily  Acute renal failure: Resolved Patient presenting with elevated creatinine of 1.53, likely secondary to prerenal azotemia in the setting of dehydration versus ATN from sepsis as above.  Received aggressive IV fluid resuscitation in the ED. --Cr 1.53>0.98 --Avoid nephrotoxins, renally dose all medications --Will hold further IV fluids at this time given history of diastolic CHF --BMP daily  Left flank pain Hx nephrolithiasis This morning, patient complaining of left flank pain, unable to get comfortable.  Reports some mild blood tinged urine.  Urinalysis on admission unrevealing.  Patient reports history of obstructive and nonobstructive nephrolithiasis. --CT renal stone study: Pending  Chronic hypoxemic respiratory failure History of asthma Follows with pulmonology outpatient, Dr. Loanne Drilling.  At baseline on 3-4 L nasal cannula.  Chest x-ray on admission with  no acute cardiopulmonary disease process. --Continue supplemental  oxygen at baseline requirements --SPO2 checks with vital signs  Chronic diastolic congestive heart failure, compensated Follows with cardiology outpatient, Dr. Acie Fredrickson.  Last TTE 05/2021 with LVEF 91-63%, grade 1 diastolic dysfunction.   --Metoprolol succinate 37.5 mg p.o. daily --Hold home Bumex --Strict I's and O's and daily weights  Anxiety Major depressive disorder --Wellbutrin 150 mg p.o. daily --Lexapro 20 mg p.o. daily --Xanax 0.5 mg p.o. twice daily as needed anxiety  Hx Addison's disease with adrenal insufficiency --Hydrocortisone 20 mg p.o. daily --Hydrocortisone 10 mg p.o. nightly  GERD:  --Dexilant 60 mg p.o. daily --Carafate as needed  Hypothyroidism --Levothyroxine 100 mcg p.o. daily  Hx migraine headache/seizure --Topamax 150 mg p.o. daily --Lamictal 100 mg p.o. qHS  Hx breast cancer, non-Hodgkin's lymphoma Continue outpatient follow-up with medical oncology   DVT prophylaxis: enoxaparin (LOVENOX) injection 40 mg Start: 04/05/22 2230    Code Status: DNR Family Communication: No family present at bedside this morning  Disposition Plan:  Level of care: Telemetry Status is: Inpatient Remains inpatient appropriate because: Continue to investigate fever of unclear etiology with history of recent gram-negative bacteremia with PICC line in place, requiring IV antibiotics, CT renal stone study pending for evaluation of left-sided flank pain    Consultants:  Infectious disease  Procedures:  None  Antimicrobials:  Vancomycin  7/6>> Cefepime 7/6>> Flagyl 7/6>>   Subjective: Patient seen examined bedside, resting comfortably.  Lying in bed.  Patient complaining of left flank pain, uncomfortable overnight even with repositioning.  Patient reports history of obstructive and nonobstructive nephrolithiasis in the past.  Also reports some blood-tinged urine.  Perseverates over all of her different lab results.  No other questions or concerns at this time.   Patient denies headache, no neck stiffness, no visual changes, no headache, no chest pain, no palpitations, no nausea/vomiting/diarrhea, no night sweats, no focal weakness, no fatigue, no paresthesias.  No acute events overnight per nursing staff.  Objective: Vitals:   04/06/22 2027 04/07/22 0524 04/07/22 0805 04/07/22 0908  BP: 119/67 97/63  121/74  Pulse: 90 82  83  Resp: (!) 22 18    Temp: 98.1 F (36.7 C) (!) 97.3 F (36.3 C)    TempSrc: Oral Oral    SpO2: 100% 100% 99%     Intake/Output Summary (Last 24 hours) at 04/07/2022 1105 Last data filed at 04/06/2022 1632 Gross per 24 hour  Intake 944.29 ml  Output --  Net 944.29 ml   There were no vitals filed for this visit.  Examination:  Physical Exam: GEN: NAD, alert and oriented x 3, chronically ill appearance, appears older than stated age 69: NCAT, PERRL, EOMI, sclera clear, MMM PULM: CTAB w/o wheezes/crackles, normal respiratory effort, on 3 L nasal cannula which is her baseline CV: RRR w/o M/G/R GI: abd soft, NTND, NABS, no R/G/M MSK: no peripheral edema, moves all extremities independently, noted left index finger amputation NEURO: CN II-XII intact, no focal deficits, sensation to light touch intact PSYCH: normal mood/affect Integumentary: Noted previous right upper chest port site without any surrounding fluctuance/erythema, noted left upper extremity PICC site with dressing in place without erythema/fluctuance, otherwise no other concerning rashes/wounds/lesions    Data Reviewed: I have personally reviewed following labs and imaging studies  CBC: Recent Labs  Lab 04/05/22 1854 04/06/22 0546 04/07/22 0423  WBC 15.2* 11.0* 9.3  NEUTROABS 12.8*  --   --   HGB 10.7* 8.9* 9.0*  HCT 34.9* 29.7* 30.2*  MCV  77.2* 79.0* 79.1*  PLT 378 284 159   Basic Metabolic Panel: Recent Labs  Lab 04/05/22 1854 04/06/22 0546 04/07/22 0423  NA 137 137 139  K 4.0 4.0 3.6  CL 106 112* 107  CO2 21* 18* 23  GLUCOSE 111*  151* 135*  BUN 34* 27* 29*  CREATININE 1.53* 0.98 1.04*  CALCIUM 9.4 8.0* 8.3*   GFR: Estimated Creatinine Clearance: 67 mL/min (A) (by C-G formula based on SCr of 1.04 mg/dL (H)). Liver Function Tests: Recent Labs  Lab 04/05/22 1854  AST 29  ALT 43  ALKPHOS 86  BILITOT 0.6  PROT 8.3*  ALBUMIN 4.0   No results for input(s): "LIPASE", "AMYLASE" in the last 168 hours. No results for input(s): "AMMONIA" in the last 168 hours. Coagulation Profile: Recent Labs  Lab 04/05/22 1854 04/06/22 0546  INR 1.1 1.2   Cardiac Enzymes: No results for input(s): "CKTOTAL", "CKMB", "CKMBINDEX", "TROPONINI" in the last 168 hours. BNP (last 3 results) No results for input(s): "PROBNP" in the last 8760 hours. HbA1C: No results for input(s): "HGBA1C" in the last 72 hours. CBG: No results for input(s): "GLUCAP" in the last 168 hours. Lipid Profile: No results for input(s): "CHOL", "HDL", "LDLCALC", "TRIG", "CHOLHDL", "LDLDIRECT" in the last 72 hours. Thyroid Function Tests: No results for input(s): "TSH", "T4TOTAL", "FREET4", "T3FREE", "THYROIDAB" in the last 72 hours. Anemia Panel: No results for input(s): "VITAMINB12", "FOLATE", "FERRITIN", "TIBC", "IRON", "RETICCTPCT" in the last 72 hours. Sepsis Labs: Recent Labs  Lab 04/05/22 1854 04/06/22 0546  PROCALCITON  --  2.00  LATICACIDVEN 1.3  --     Recent Results (from the past 240 hour(s))  Blood culture (routine x 2)     Status: None (Preliminary result)   Collection Time: 04/05/22  8:47 PM   Specimen: BLOOD  Result Value Ref Range Status   Specimen Description   Final    BLOOD BOTTLES DRAWN AEROBIC AND ANAEROBIC Performed at [facility], [place] [address] [place], [place] [address]    Special Requests   Final    Blood Culture results may not be optimal due to an excessive volume of blood received in culture bottles BLOOD LEFT ARM Performed at [place] [address], [place], [place]  [address]    Culture   Final    NO GROWTH 2 DAYS Performed at [place] [address] [place], [place] [address]    Report Status PENDING  Incomplete  Blood culture (routine x 2)     Status: None (Preliminary result)   Collection Time: 04/05/22  8:52 PM   Specimen: BLOOD  Result Value Ref Range Status   Specimen Description   Final    BLOOD BOTTLES DRAWN AEROBIC AND ANAEROBIC Performed at [place] [address], [place], [place] [address]    Special Requests   Final    Blood Culture adequate volume BACTERIAL CASTS Performed at [place] [address], [place], [place] [address]    Culture   Final    NO GROWTH 2 DAYS Performed at [place] [address], [place], [place] [address]    Report Status PENDING  Incomplete         Radiology Studies: VAS [place] UPPER EXTREMITY VENOUS DUPLEX  Result Date: 04/06/2022 UPPER VENOUS STUDY  Patient Name:  [name]  Date of Exam:   04/06/2022 Medical Rec #: [id number]       Accession #:  [id number] Date of Birth: [birth year]       Patient Gender: F Patient Age:   55 years Exam Location:  [facility] Procedure:      VAS [place] UPPER EXTREMITY VENOUS DUPLEX Referring Phys: [name] --------------------------------------------------------------------------------  Indications: Pain Risk Factors: PICC line. Comparison Study: No prior studies. Performing Technologist: Oliver Hum RVT  Examination Guidelines: A complete evaluation includes B-mode imaging, spectral Doppler, color Doppler, and power Doppler as needed of all accessible portions of each vessel. Bilateral testing is considered an integral part of a complete examination. Limited examinations for reoccurring indications may be performed as noted.  Right Findings: +----------+------------+---------+-----------+----------+-------+ RIGHT     CompressiblePhasicitySpontaneousPropertiesSummary  +----------+------------+---------+-----------+----------+-------+ Subclavian    Full       Yes       Yes                      +----------+------------+---------+-----------+----------+-------+  Left Findings: +----------+------------+---------+-----------+----------+-------+ LEFT      CompressiblePhasicitySpontaneousPropertiesSummary +----------+------------+---------+-----------+----------+-------+ IJV           Full       Yes       Yes                      +----------+------------+---------+-----------+----------+-------+ Subclavian    Full       Yes       Yes                      +----------+------------+---------+-----------+----------+-------+ Axillary      Full       Yes       Yes                      +----------+------------+---------+-----------+----------+-------+ Brachial      Full       Yes       Yes                      +----------+------------+---------+-----------+----------+-------+ Radial        Full                                          +----------+------------+---------+-----------+----------+-------+ Ulnar         Full                                          +----------+------------+---------+-----------+----------+-------+ Cephalic      Full                                          +----------+------------+---------+-----------+----------+-------+ Basilic       Full                                          +----------+------------+---------+-----------+----------+-------+  Summary:  Right: No evidence of thrombosis in the subclavian.  Left: No evidence of deep vein thrombosis in the upper extremity. No evidence of superficial vein thrombosis in the upper extremity.  *See table(s) above for measurements and observations.     Preliminary    DG Chest Portable  1 View  Result Date: 04/05/2022 CLINICAL DATA:  Fever, recent PICC placement EXAM: PORTABLE CHEST 1 VIEW COMPARISON:  03/28/2022 FINDINGS: Single frontal view of the  chest demonstrates interval removal of the right-sided chest wall port. Left-sided PICC tip projects over superior vena cava. Cardiac silhouette is unremarkable. No airspace disease, effusion, or pneumothorax. No acute bony abnormalities. IMPRESSION: 1. No acute intrathoracic process. Electronically Signed   By: Randa Ngo M.D.   On: 04/05/2022 21:32        Scheduled Meds:  aspirin  325 mg Oral QHS   buPROPion  150 mg Oral Daily   dexlansoprazole  60 mg Oral Daily   enoxaparin (LOVENOX) injection  40 mg Subcutaneous Q24H   escitalopram  20 mg Oral QPM   hydrocortisone  10 mg Oral QPM   hydrocortisone  20 mg Oral Daily   lamoTRIgine  100 mg Oral QHS   levothyroxine  100 mcg Oral q morning   linagliptin  5 mg Oral Daily   metoprolol succinate  37.5 mg Oral Daily   mometasone-formoterol  2 puff Inhalation BID   rosuvastatin  10 mg Oral Daily   saccharomyces boulardii  250 mg Oral BID   topiramate  150 mg Oral QHS   Continuous Infusions:  ceFEPime (MAXIPIME) IV 2 g (04/07/22 0905)   metronidazole 500 mg (04/06/22 2337)   vancomycin 1,500 mg (04/06/22 2143)     LOS: 2 days    Time spent: 52 minutes spent on chart review, discussion with nursing staff, consultants, updating family and interview/physical exam; more than 50% of that time was spent in counseling and/or coordination of care.    Masen Luallen J [place] DO [facility] Available via Epic secure chat 7am-7pm After these hours, please refer to coverage provider listed on [email or web address] 04/07/2022, 11:05 AM

## 2022-04-08 DIAGNOSIS — K219 Gastro-esophageal reflux disease without esophagitis: Secondary | ICD-10-CM | POA: Diagnosis not present

## 2022-04-08 DIAGNOSIS — R509 Fever, unspecified: Secondary | ICD-10-CM | POA: Diagnosis not present

## 2022-04-08 DIAGNOSIS — F419 Anxiety disorder, unspecified: Secondary | ICD-10-CM | POA: Diagnosis not present

## 2022-04-08 DIAGNOSIS — E271 Primary adrenocortical insufficiency: Secondary | ICD-10-CM | POA: Diagnosis not present

## 2022-04-08 MED ORDER — BUMETANIDE 1 MG PO TABS
1.0000 mg | ORAL_TABLET | Freq: Two times a day (BID) | ORAL | Status: DC
  Administered 2022-04-08 – 2022-04-09 (×2): 1 mg via ORAL
  Filled 2022-04-08 (×3): qty 1

## 2022-04-08 NOTE — Progress Notes (Addendum)
PROGRESS NOTE    [name]  PYY:[id number] DOB: 1970-11-27 DOA: 04/05/2022 PCP: Nicholas Lose, MD    Brief Narrative:   [name] is a 51 y.o. 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 ED on 7/6 with acute onset fever with associated chills.  Reported fever at home 103.9.  Patient reports cough without wheezing or dyspnea.  Also with occasional nausea without vomiting/diarrhea.  Denies chest pains, no palpitations, no dysuria, hematuria, no flank pain, no urinary urgency.  Recently admitted at [facility] for bacteremia due to Enterobacter species and discharged on 03/31/2022 with continue antibiotics with Levaquin 750 mg daily for 10 days from the time of her port removal.  During the hospitalization she had a PICC line placed in her left arm for history of poor venous access and need of multiple blood draws from her outpatient providers.  Patient reports compliance with the Levaquin and no missed doses.  No sick contacts or other changes in home medications.  In the ED, temperature 102.1 F, HR 110, RR 29, BP 110/68, SPO2 100% on 3L  which is her baseline requirement.  WBC 15.2, hemoglobin 10.7, platelets 378.  Sodium 137, potassium 4.0, chloride 106, CO2 21, glucose 111, BUN 34, creatinine 1.53.  Lactic acid 1.3.  INR 1.2.  Urinalysis unrevealing.  Chest x-ray with no acute cardiopulmonary disease process.  Patient was started on  IV vancomycin/cefepime.  Given 2.5 L IV normal saline bolus, Tylenol 650 mg p.o. x1, Ativan 0.5 mg IV x1 and Solu-Medrol 40 mg IV.  TRH consulted for further evaluation and management of fever concerning for sepsis with recurrent bacteremia.  Assessment & Plan:   Sepsis, POA Recent Enterobacter aerogenes bacteremia Patient presenting to the ED with recurrent fever up to 103.9 at home.  On arrival to the ED temperature 102.1 F with tachycardia, tachypnea and elevated WBC count of 15.2 with evidence of acute endorgan damage with renal failure with creatinine 1.53 on admission.  Procalcitonin elevated 2.00.  Chest x-ray and urinalysis unrevealing.  Previous port site with dressing in place and new PICC line site left upper extremity with no concerning fluctuance/erythema.  --ID following, appreciate assistance --WBC 15.2>11.0>9.3 --PCT 2.00 --Blood cultures x 2: No growth x 2 days --Vancomycin, pharmacy consulted for dosing/monitoring --Cefepime --Supportive care, antipyretics  Acute renal failure: Resolved Patient presenting with elevated creatinine of 1.53, likely secondary to prerenal azotemia in the setting of dehydration versus ATN from sepsis as above.  Received aggressive IV fluid resuscitation in the ED. --Cr 1.53>0.98>1.05 --Avoid nephrotoxins, renally dose all medications --Will hold further IV fluids at this time given history of diastolic CHF  Left flank pain Hx nephrolithiasis This morning, patient complaining of left flank pain, unable to get comfortable.  Reports some mild blood tinged urine.  Urinalysis on admission unrevealing.  Patient reports history of obstructive and nonobstructive nephrolithiasis.  CT renal stone study with no nephrolithiasis or obstructive uropathy.  Chronic hypoxemic respiratory failure History of asthma Follows with pulmonology outpatient, Dr. Loanne Drilling.  At baseline on 3-4 L nasal cannula.  Chest x-ray on admission with no acute cardiopulmonary  disease process. --Continue supplemental oxygen at baseline requirements --SPO2 checks with vital signs  Chronic diastolic congestive heart failure, compensated Follows with cardiology outpatient, Dr. Acie Fredrickson.  Last TTE 05/2021 with LVEF 39-53%, grade 1 diastolic dysfunction.   --Metoprolol succinate 37.5 mg p.o. daily --Bumex 35m PO BID --Strict I's and O's and daily weights  Anxiety Major depressive disorder --Wellbutrin 150 mg p.o. daily --Lexapro 20 mg p.o. daily --Xanax 0.5 mg p.o. twice daily as needed anxiety  Hx Addison's disease with adrenal insufficiency --Hydrocortisone 20 mg p.o. daily --Hydrocortisone 10 mg p.o. nightly  GERD:  --Dexilant 60 mg p.o. daily --Carafate as needed  Hypothyroidism --Levothyroxine 100 mcg p.o. daily  Hx migraine headache/seizure --Topamax 150 mg p.o. daily --Lamictal 100 mg p.o. qHS  Hx breast cancer, non-Hodgkin's lymphoma Continue outpatient follow-up with medical oncology   DVT prophylaxis: enoxaparin (LOVENOX) injection 40 mg Start: 04/05/22 2230    Code Status: DNR Family Communication: No family present at bedside this morning  Disposition Plan:  Level of care: Med-Surg Status is: Inpatient Remains inpatient appropriate because: Continue to investigate fever of unclear etiology with history of recent gram-negative bacteremia with PICC line in place which has now been discontinued on 7/8, requiring IV antibiotics, await finalized recommendations from infectious disease; anticipate discharge home in 1-2 days  Consultants:  Infectious disease  Procedures:  LUE PICC line removal 7/8  Antimicrobials:  Vancomycin  7/6>> Cefepime 7/6>> Flagyl 7/6>>   Subjective: Patient seen examined bedside, resting comfortably.  Lying in bed.  Tearful about her chronic medical issues.  PICC line was removed yesterday, patient states "central lines made me feel safe like a security blanket".  Discussed with her that these indwelling  catheters have a high risk of infection the longer they stay in, she seems to understand.  Continues to be anxious and perseverating on all of her lab results.  No other questions or concerns at this time.  Denies headache, no chest pain, no fever/chills/night sweats, no nausea/vomiting/diarrhea, no palpitations, no shortness of breath than her typical baseline, no nausea/vomiting/diarrhea, no focal weakness, no fatigue, no paresthesias.  Patient refusing peripheral lab draws, otherwise no acute events overnight per nursing staff.   Objective: Vitals:   04/07/22 1431 04/07/22 2102 04/08/22 0350 04/08/22 0751  BP: 101/71 (!) 141/76 119/69   Pulse: 79 85 75   Resp: _0 Temp: 97.8 F (36.6 C) 97.9 F (36.6 C) 97.9 F (36.6 C)   TempSrc: Oral Oral Oral   SpO2: 100% 99% 100% 100%    Intake/Output Summary (Last 24 hours) at 04/08/2022 1056 Last data filed at 04/07/2022 1432 Gross per 24 hour  Intake 592 ml  Output --  Net 592 ml   There were no vitals filed for this visit.  Examination:  Physical Exam: GEN: NAD, alert and oriented x 3, chronically ill appearance, appears older than stated age H50 NCAT, PERRL, EOMI, sclera clear, MMM PULM: CTAB w/o wheezes/crackles, normal respiratory effort, on 3 L nasal cannula which is her baseline CV: RRR w/o M/G/R GI: abd soft, NTND, NABS, no R/G/M MSK: no peripheral edema, moves all extremities independently, noted left index finger amputation NEURO: CN II-XII intact, no focal deficits, sensation to light touch intact PSYCH: normal mood/affect Integumentary: Noted previous right upper chest port site and LUE PICC site without any surrounding fluctuance/erythema, no concerning rashes/lesions/wounds    Data Reviewed: I have personally reviewed following labs and imaging studies  CBC: Recent Labs  Lab 04/05/22 1854 04/06/22 0546 04/07/22 0423  WBC 15.2* 11.0* 9.3  NEUTROABS 12.8*  --   --   HGB 10.7* 8.9* 9.0*  HCT 34.9* 29.7*  30.2*  MCV 77.2* 79.0* 79.1*  PLT 378 284 272   Basic Metabolic Panel: Recent Labs  Lab 04/05/22 1854 04/06/22 0546 04/07/22 0423  NA 137 137 139  K 4.0 4.0 3.6  CL 106 112* 107  CO2 21* 18* 23  GLUCOSE 111* 151* 135*  BUN 34* 27* 29*  CREATININE 1.53* 0.98 1.04*  CALCIUM 9.4 8.0* 8.3*   GFR: Estimated Creatinine Clearance: 67 mL/min (A) (by C-G formula based on SCr of 1.04 mg/dL (H)). Liver Function Tests: Recent Labs  Lab 04/05/22 1854  AST 29  ALT 43  ALKPHOS 86  BILITOT 0.6  PROT 8.3*  ALBUMIN 4.0   No results for input(s): "LIPASE", "AMYLASE" in the last 168 hours. No results for input(s): "AMMONIA" in the last 168 hours. Coagulation Profile: Recent Labs  Lab 04/05/22 1854 04/06/22 0546  INR 1.1 1.2   Cardiac Enzymes: No results for input(s): "CKTOTAL", "CKMB", "CKMBINDEX", "TROPONINI" in the last 168 hours. BNP (last 3 results) No results for input(s): "PROBNP" in the last 8760 hours. HbA1C: No results for input(s): "HGBA1C" in the last 72 hours. CBG: No results for input(s): "GLUCAP" in the last 168 hours. Lipid Profile: No results for input(s): "CHOL", "HDL", "LDLCALC", "TRIG", "CHOLHDL", "LDLDIRECT" in the last 72 hours. Thyroid Function Tests: No results for input(s): "TSH", "T4TOTAL", "FREET4", "T3FREE", "THYROIDAB" in the last 72 hours. Anemia Panel: No results for input(s): "VITAMINB12", "FOLATE", "FERRITIN", "TIBC", "IRON", "RETICCTPCT" in the last 72 hours. Sepsis Labs: Recent Labs  Lab 04/05/22 1854 04/06/22 0546  PROCALCITON  --  2.00  LATICACIDVEN 1.3  --     Recent Results (from the past 240 hour(s))  Blood culture (routine x 2)     Status: None (Preliminary result)   Collection Time: 04/05/22  8:47 PM   Specimen: BLOOD  Result Value Ref Range Status   Specimen Description   Final    BLOOD BOTTLES DRAWN AEROBIC AND ANAEROBIC Performed at [facility], [place] [address], [place], [place] [address]    Special  Requests   Final    Blood Culture results may not be optimal due to an excessive volume of blood received in culture bottles BLOOD LEFT ARM Performed at [facility] [address], [place], [place] [address]    Culture   Final    NO GROWTH 3 DAYS Performed at [facility], [place] [address] [place], [place] [address]    Report Status PENDING  Incomplete  Blood culture (routine x 2)     Status: None (Preliminary result)   Collection Time: 04/05/22  8:52 PM   Specimen: BLOOD  Result Value Ref Range Status   Specimen Description   Final    BLOOD BOTTLES DRAWN AEROBIC AND ANAEROBIC Performed at [facility] [address] [place], [place] [address]    Special Requests   Final    Blood Culture adequate volume BACTERIAL CASTS Performed at [facility] [address], [place], [place] [address]    Culture   Final    NO GROWTH 3 DAYS Performed at [facility], [place] [address], [place], [place] [address]    Report Status PENDING  Incomplete         Radiology Studies: CT RENAL  STONE STUDY  Result Date: 04/07/2022 CLINICAL DATA:  Flank pain. EXAM: CT ABDOMEN AND PELVIS WITHOUT CONTRAST TECHNIQUE: Multidetector CT imaging of the abdomen and pelvis was performed following the standard protocol without IV contrast. RADIATION DOSE REDUCTION: This exam was performed according to the departmental dose-optimization program which includes automated exposure control, adjustment of the mA and/or kV according to patient size and/or use of iterative reconstruction technique. COMPARISON:  July 15, 2020 FINDINGS: Lower chest: 9 mm subpleural pulmonary nodule is stable from 2016 and therefore consistent with benign etiology. Hepatobiliary: Several hypoattenuated lesions throughout the liver, too small to accurately characterize by CT. Normal gallbladder. Pancreas: Unremarkable. No pancreatic ductal dilatation or surrounding  inflammatory changes. Spleen: Normal in size without focal abnormality. Adrenals/Urinary Tract: Adrenal glands are unremarkable. Kidneys are normal, without renal calculi, focal lesion, or hydronephrosis. Bladder is unremarkable. Stomach/Bowel: Stomach is within normal limits. Post appendectomy. No evidence of bowel wall thickening, distention, or inflammatory changes. Vascular/Lymphatic: No significant vascular findings are present. No enlarged abdominal or pelvic lymph nodes. Reproductive: Status post hysterectomy. No adnexal masses. Other: No abdominal wall hernia or abnormality. No abdominopelvic ascites. Musculoskeletal: Right L5-S1 pars articularis defect without alignment abnormalities. IMPRESSION: 1. No evidence of nephrolithiasis or obstructive uropathy. 2. Several hypoattenuated lesions throughout the liver, too small to accurately characterize by CT. 3. Right L5-S1 pars articularis defect without alignment abnormalities. Electronically Signed   By: Fidela Salisbury M.D.   On: 04/07/2022 14:18   VAS [place] UPPER EXTREMITY VENOUS DUPLEX  Result Date: 04/07/2022 UPPER VENOUS STUDY  Patient Name:  [name]  Date of Exam:   04/06/2022 Medical Rec #: [id number]       Accession #:    [id number] Date of Birth: 10-May-1971       Patient Gender: F Patient Age:   83 years Exam Location:  [facility] Procedure:      VAS [place] UPPER EXTREMITY VENOUS DUPLEX Referring Phys: [name] --------------------------------------------------------------------------------  Indications: Pain Risk Factors: PICC line. Comparison Study: No prior studies. Performing Technologist: Oliver Hum RVT  Examination Guidelines: A complete evaluation includes B-mode imaging, spectral Doppler, color Doppler, and power Doppler as needed of all accessible portions of each vessel. Bilateral testing is considered an integral part of a complete examination. Limited examinations for reoccurring indications may be performed as  noted.  Right Findings: +----------+------------+---------+-----------+----------+-------+ RIGHT     CompressiblePhasicitySpontaneousPropertiesSummary +----------+------------+---------+-----------+----------+-------+ Subclavian    Full       Yes       Yes                      +----------+------------+---------+-----------+----------+-------+  Left Findings: +----------+------------+---------+-----------+----------+-------+ LEFT      CompressiblePhasicitySpontaneousPropertiesSummary +----------+------------+---------+-----------+----------+-------+ IJV           Full       Yes       Yes                      +----------+------------+---------+-----------+----------+-------+ Subclavian    Full       Yes       Yes                      +----------+------------+---------+-----------+----------+-------+ Axillary      Full       Yes       Yes                      +----------+------------+---------+-----------+----------+-------+ Brachial  Full       Yes       Yes                      +----------+------------+---------+-----------+----------+-------+ Radial        Full                                          +----------+------------+---------+-----------+----------+-------+ Ulnar         Full                                          +----------+------------+---------+-----------+----------+-------+ Cephalic      Full                                          +----------+------------+---------+-----------+----------+-------+ Basilic       Full                                          +----------+------------+---------+-----------+----------+-------+  Summary:  Right: No evidence of thrombosis in the subclavian.  Left: No evidence of deep vein thrombosis in the upper extremity. No evidence of superficial vein thrombosis in the upper extremity.  *See table(s) above for measurements and observations.  Diagnosing physician: Orlie Pollen  Electronically signed by Orlie Pollen on 04/07/2022 at 11:16:54 AM.    Final         Scheduled Meds:  aspirin  325 mg Oral QHS   buPROPion  150 mg Oral Daily   dexlansoprazole  60 mg Oral Daily   enoxaparin (LOVENOX) injection  40 mg Subcutaneous Q24H   escitalopram  20 mg Oral QPM   hydrocortisone  10 mg Oral QPM   hydrocortisone  20 mg Oral Daily   lamoTRIgine  100 mg Oral QHS   levothyroxine  100 mcg Oral q morning   linagliptin  5 mg Oral Daily   metoprolol succinate  37.5 mg Oral Daily   mometasone-formoterol  2 puff Inhalation BID   rosuvastatin  10 mg Oral Daily   saccharomyces boulardii  250 mg Oral BID   topiramate  150 mg Oral QHS   Continuous Infusions:  ceFEPime (MAXIPIME) IV 2 g (04/08/22 0932)   metronidazole 500 mg (04/08/22 0023)   vancomycin 1,500 mg (04/07/22 2207)     LOS: 3 days    Time spent: 52 minutes spent on chart review, discussion with nursing staff, consultants, updating family and interview/physical exam; more than 50% of that time was spent in counseling and/or coordination of care.    Shantelle Alles J [place] DO [facility] Available via Epic secure chat 7am-7pm After these hours, please refer to coverage provider listed on [email or web address] 04/08/2022, 10:56 AM

## 2022-04-08 NOTE — Progress Notes (Addendum)
[name] refuses to have blood drawn peripherally, Dr [place] notified. She is also using 3 L O2 via Nasal cannula, as she does at home.

## 2022-04-09 DIAGNOSIS — R509 Fever, unspecified: Secondary | ICD-10-CM | POA: Diagnosis not present

## 2022-04-09 DIAGNOSIS — K219 Gastro-esophageal reflux disease without esophagitis: Secondary | ICD-10-CM | POA: Diagnosis not present

## 2022-04-09 DIAGNOSIS — F419 Anxiety disorder, unspecified: Secondary | ICD-10-CM | POA: Diagnosis not present

## 2022-04-09 DIAGNOSIS — Z452 Encounter for adjustment and management of vascular access device: Secondary | ICD-10-CM | POA: Diagnosis not present

## 2022-04-09 DIAGNOSIS — E271 Primary adrenocortical insufficiency: Secondary | ICD-10-CM | POA: Diagnosis not present

## 2022-04-09 NOTE — Progress Notes (Addendum)
[facility] Infectious Diseases Follow Up Note  Patient Identification: Patient Name: [name] MRN: [id number] Admit Date: 04/05/2022  5:56 PM Age: 51 y.o.Today's Date: 04/09/2022  Reason for Visit: fevers   Principal Problem:   Fever Active Problems:   Hypertension   Hyperlipidemia   Addison's disease ([place])   Anxiety and depression   GERD without esophagitis   Sepsis ([place])   PICC (peripherally inserted central catheter) in place  Antibiotics:  Cefepime 7/6-7/8 Vancomycin 7/6- Metronidazole 7/6-   Lines/Hardware - left arm PICC removed   Interval Events: remains afebrile, left arm PICC has been removed   Assessment # Fevers with concern of PICC line infection - PICC removed, fevers resolved ( only one time in the hospital) 7/6 blood cx NG in 4 days   #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 DC cefepime and metronidazole.   Patient willing to not have another central line at this time due to concerns for recurrent infections.   ID available as needed. Please call with questions.   Rest of the management as per the primary team. Thank you for the consult. Please page with pertinent questions or concerns.  ______________________________________________________________________ Subjective patient seen and examined at the bedside.  Doing well and no complaints   Vitals BP 105/62   Pulse 74   Temp 98.7 F (37.1 C) (Oral)   Resp 20   LMP  (LMP Unknown)   SpO2 99%     Physical Exam Constitutional: Sitting up in the bed, on nasal cannula, appears comfortable    Comments:   Cardiovascular:     Rate and Rhythm: Normal rate and regular rhythm.     Heart sounds:   Pulmonary:      Effort: Pulmonary effort is normal on nasal cannula    Comments: Right chest port removal site with no swelling erythema and fluctuance  Abdominal:     Palpations: Abdomen is soft.     Tenderness: Nondistended  Musculoskeletal:        General: No swelling or tenderness.  Left index finger near amputation  Skin:    Comments: Left arm PICC removal site okay  Neurological:     General: Grossly nonfocal, awake alert and oriented  Psychiatric:        Mood and Affect: Mood normal.   Pertinent Microbiology Results for orders placed or performed during the hospital encounter of 04/05/22  Blood culture (routine x 2)     Status: None (Preliminary result)   Collection Time: 04/05/22  8:47 PM   Specimen: BLOOD  Result Value Ref Range Status   Specimen Description   Final    BLOOD BOTTLES DRAWN AEROBIC AND ANAEROBIC Performed at [facility] [address] [place], [place] [address]    Special Requests   Final    Blood Culture results may not be optimal due to an excessive volume of blood received in culture bottles BLOOD LEFT ARM Performed at [facility] [address], [place], [place] [address]    Culture   Final    NO GROWTH 4 DAYS Performed at [facility], [place] [address] [place], [place] [address]    Report Status PENDING  Incomplete  Blood culture (routine x 2)     Status: None (Preliminary result)  Collection Time: 04/05/22  8:52 PM   Specimen: BLOOD  Result Value Ref Range Status   Specimen Description   Final    BLOOD BOTTLES DRAWN AEROBIC AND ANAEROBIC Performed at [facility] [address] [place], [place] [address]    Special Requests   Final    Blood Culture adequate volume BACTERIAL CASTS Performed at [facility] [address], [place], [place] [address]    Culture   Final    NO GROWTH 4 DAYS Performed at [facility], [place] [address], [place],  [address]     Report Status PENDING  Incomplete    Pertinent Lab.    Latest Ref Rng & Units 04/07/2022    4:23 AM 04/06/2022    5:46 AM 04/05/2022    6:54 PM  CBC  WBC 4.0 - 10.5 K/uL 9.3  11.0  15.2   Hemoglobin 12.0 - 15.0 g/dL 9.0  8.9  10.7   Hematocrit 36.0 - 46.0 % 30.2  29.7  34.9   Platelets 150 - 400 K/uL 308  284  378       Latest Ref Rng & Units 04/07/2022    4:23 AM 04/06/2022    5:46 AM 04/05/2022    6:54 PM  CMP  Glucose 70 - 99 mg/dL 135  151  111   BUN 6 - 20 mg/dL 29  27  34   Creatinine 0.44 - 1.00 mg/dL 1.04  0.98  1.53   Sodium 135 - 145 mmol/L 139  137  137   Potassium 3.5 - 5.1 mmol/L 3.6  4.0  4.0   Chloride 98 - 111 mmol/L 107  112  106   CO2 22 - 32 mmol/L _0 Calcium 8.9 - 10.3 mg/dL 8.3  8.0  9.4   Total Protein 6.5 - 8.1 g/dL   8.3   Total Bilirubin 0.3 - 1.2 mg/dL   0.6   Alkaline Phos 38 - 126 U/L   86   AST 15 - 41 U/L   29   ALT 0 - 44 U/L   43      Pertinent Imaging today Plain films and CT images have been personally visualized and interpreted; radiology reports have been reviewed. Decision making incorporated into the Impression / Recommendations.  CT RENAL STONE STUDY  Result Date: 04/07/2022 CLINICAL DATA:  Flank pain. EXAM: CT ABDOMEN AND PELVIS WITHOUT CONTRAST TECHNIQUE: Multidetector CT imaging of the abdomen and pelvis was performed following the standard protocol without IV contrast. RADIATION DOSE REDUCTION: This exam was performed according to the departmental dose-optimization program which includes automated exposure control, adjustment of the mA and/or kV according to patient size and/or use of iterative reconstruction technique. COMPARISON:  July 15, 2020 FINDINGS: Lower chest: 9 mm subpleural pulmonary nodule is stable from 2016 and therefore consistent with benign etiology. Hepatobiliary: Several hypoattenuated lesions throughout the liver, too small to accurately characterize by CT. Normal gallbladder. Pancreas: Unremarkable. No  pancreatic ductal dilatation or surrounding inflammatory changes. Spleen: Normal in size without focal abnormality. Adrenals/Urinary Tract: Adrenal glands are unremarkable. Kidneys are normal, without renal calculi, focal lesion, or hydronephrosis. Bladder is unremarkable. Stomach/Bowel: Stomach is within normal limits. Post appendectomy. No evidence of bowel wall thickening, distention, or inflammatory changes. Vascular/Lymphatic: No significant vascular findings are present. No enlarged abdominal or pelvic lymph nodes. Reproductive: Status post hysterectomy. No adnexal masses. Other: No abdominal wall hernia or abnormality. No abdominopelvic ascites.  Musculoskeletal: Right L5-S1 pars articularis defect without alignment abnormalities. IMPRESSION: 1. No evidence of nephrolithiasis or obstructive uropathy. 2. Several hypoattenuated lesions throughout the liver, too small to accurately characterize by CT. 3. Right L5-S1 pars articularis defect without alignment abnormalities. Electronically Signed   By: Fidela Salisbury M.D.   On: 04/07/2022 14:18   VAS [place] UPPER EXTREMITY VENOUS DUPLEX  Result Date: 04/07/2022 UPPER VENOUS STUDY  Patient Name:  SHARMIN FOULK  Date of Exam:   04/06/2022 Medical Rec #: [id number]       Accession #:    [id number] Date of Birth: 06-May-1971       Patient Gender: F Patient Age:   65 years Exam [facility]  [facility] Procedure:      VAS [place] UPPER EXTREMITY VENOUS DUPLEX Referring Phys: [name] --------------------------------------------------------------------------------  Indications: Pain Risk Factors: PICC line. Comparison Study: No prior studies. Performing Technologist: Oliver Hum RVT  Examination Guidelines: A complete evaluation includes B-mode imaging, spectral Doppler, color Doppler, and power Doppler as needed of all accessible portions of each vessel. Bilateral testing is considered an integral part of a complete examination. Limited examinations for  reoccurring indications may be performed as noted.  Right Findings: +----------+------------+---------+-----------+----------+-------+ RIGHT     CompressiblePhasicitySpontaneousPropertiesSummary +----------+------------+---------+-----------+----------+-------+ Subclavian    Full       Yes       Yes                      +----------+------------+---------+-----------+----------+-------+  Left Findings: +----------+------------+---------+-----------+----------+-------+ LEFT      CompressiblePhasicitySpontaneousPropertiesSummary +----------+------------+---------+-----------+----------+-------+ IJV           Full       Yes       Yes                      +----------+------------+---------+-----------+----------+-------+ Subclavian    Full       Yes       Yes                      +----------+------------+---------+-----------+----------+-------+ Axillary      Full       Yes       Yes                      +----------+------------+---------+-----------+----------+-------+ Brachial      Full       Yes       Yes                      +----------+------------+---------+-----------+----------+-------+ Radial        Full                                          +----------+------------+---------+-----------+----------+-------+ Ulnar         Full                                          +----------+------------+---------+-----------+----------+-------+ Cephalic      Full                                          +----------+------------+---------+-----------+----------+-------+ Basilic  Full                                          +----------+------------+---------+-----------+----------+-------+  Summary:  Right: No evidence of thrombosis in the subclavian.  Left: No evidence of deep vein thrombosis in the upper extremity. No evidence of superficial vein thrombosis in the upper extremity.  *See table(s) above for measurements and observations.   Diagnosing physician: Orlie Pollen Electronically signed by Orlie Pollen on 04/07/2022 at 11:16:54 AM.    Final    DG Chest Portable 1 View  Result Date: 04/05/2022 CLINICAL DATA:  Fever, recent PICC placement EXAM: PORTABLE CHEST 1 VIEW COMPARISON:  03/28/2022 FINDINGS: Single frontal view of the chest demonstrates interval removal of the right-sided chest wall port. Left-sided PICC tip projects over superior vena cava. Cardiac silhouette is unremarkable. No airspace disease, effusion, or pneumothorax. No acute bony abnormalities. IMPRESSION: 1. No acute intrathoracic process. Electronically Signed   By: Randa Ngo M.D.   On: 04/05/2022 21:32   IR PICC PLACEMENT LEFT >5 YRS INC IMG GUIDE  Result Date: 03/30/2022 CLINICAL DATA:  Bacteremia and need to remove indwelling right chest Port-A-Cath. Request to also place a PICC line for further IV access needs. EXAM: 1. LEFT UPPER EXTREMITY PICC LINE PLACEMENT WITH ULTRASOUND AND FLUOROSCOPIC GUIDANCE 2. REMOVAL OF RIGHT CHEST TUNNELED CENTRAL VENOUS PORT-A-CATH FLUOROSCOPY: 1.0 mGy MEDICATIONS: Moderate (conscious) sedation was employed during this procedure. A total of Versed 2.0 mg and Fentanyl 150 mcg was administered intravenously by radiology nursing. Moderate Sedation Time: 43 minutes. The patient's level of consciousness and vital signs were monitored continuously by radiology nursing throughout the procedure under my direct supervision. PROCEDURE: The patient was advised of the possible risks and complications and agreed to undergo the procedure. The patient was then brought to the angiographic suite for the procedure. A time-out was performed prior to initiating the procedure. The left arm was prepped with chlorhexidine, draped in the usual sterile fashion using maximum barrier technique (cap and mask, sterile gown, sterile gloves, large sterile sheet, hand hygiene and cutaneous antisepsis) and infiltrated locally with 1% Lidocaine. Ultrasound  demonstrated patency of the left brachial vein, and this was documented with an image. Under real-time ultrasound guidance, this vein was accessed with a 21 gauge micropuncture needle and image documentation was performed. A 0.018 wire was introduced in to the vein. Over this, a 5.0 [place] single lumen power injectable PICC was advanced to the lower SVC/right atrial junction. Fluoroscopy during the procedure and fluoro spot radiograph confirms appropriate catheter position. The catheter was flushed and covered with a sterile dressing. Catheter length: 43 cm The right chest port site was prepped with chlorhexidine. Local anesthesia was provided with 1% lidocaine. Utilizing sharp and blunt dissection, the subcutaneous chest port and attached catheter were removed in their entirety. The port pocket was irrigated with sterile saline. The wound was closed with subcutaneous 3-0 Monocryl and subcuticular 4-0 Vicryl. Dermabond was applied to the incision. FINDINGS: The port pocket was clean in appearance and not overtly infected. COMPLICATIONS: None IMPRESSION: 1. Placement single-lumen power injectable PICC line via left brachial vein with catheter tip at SVC/RA junction. 2. Removal of implanted right chest Port-A-Cath utilizing sharp and blunt dissection. The wound was closed. Electronically Signed   By: Aletta Edouard M.D.   On: 03/30/2022 17:21   IR REMOVAL TUN ACCESS W/ PORT W/O FL MOD  SED  Result Date: 03/30/2022 CLINICAL DATA:  Bacteremia and need to remove indwelling right chest Port-A-Cath. Request to also place a PICC line for further IV access needs. EXAM: 1. LEFT UPPER EXTREMITY PICC LINE PLACEMENT WITH ULTRASOUND AND FLUOROSCOPIC GUIDANCE 2. REMOVAL OF RIGHT CHEST TUNNELED CENTRAL VENOUS PORT-A-CATH FLUOROSCOPY: 1.0 mGy MEDICATIONS: Moderate (conscious) sedation was employed during this procedure. A total of Versed 2.0 mg and Fentanyl 150 mcg was administered intravenously by radiology nursing. Moderate  Sedation Time: 43 minutes. The patient's level of consciousness and vital signs were monitored continuously by radiology nursing throughout the procedure under my direct supervision. PROCEDURE: The patient was advised of the possible risks and complications and agreed to undergo the procedure. The patient was then brought to the angiographic suite for the procedure. A time-out was performed prior to initiating the procedure. The left arm was prepped with chlorhexidine, draped in the usual sterile fashion using maximum barrier technique (cap and mask, sterile gown, sterile gloves, large sterile sheet, hand hygiene and cutaneous antisepsis) and infiltrated locally with 1% Lidocaine. Ultrasound demonstrated patency of the left brachial vein, and this was documented with an image. Under real-time ultrasound guidance, this vein was accessed with a 21 gauge micropuncture needle and image documentation was performed. A 0.018 wire was introduced in to the vein. Over this, a 5.0 [place] single lumen power injectable PICC was advanced to the lower SVC/right atrial junction. Fluoroscopy during the procedure and fluoro spot radiograph confirms appropriate catheter position. The catheter was flushed and covered with a sterile dressing. Catheter length: 43 cm The right chest port site was prepped with chlorhexidine. Local anesthesia was provided with 1% lidocaine. Utilizing sharp and blunt dissection, the subcutaneous chest port and attached catheter were removed in their entirety. The port pocket was irrigated with sterile saline. The wound was closed with subcutaneous 3-0 Monocryl and subcuticular 4-0 Vicryl. Dermabond was applied to the incision. FINDINGS: The port pocket was clean in appearance and not overtly infected. COMPLICATIONS: None IMPRESSION: 1. Placement single-lumen power injectable PICC line via left brachial vein with catheter tip at SVC/RA junction. 2. Removal of implanted right chest Port-A-Cath utilizing sharp  and blunt dissection. The wound was closed. Electronically Signed   By: Aletta Edouard M.D.   On: 03/30/2022 17:21   DG Chest 2 View  Result Date: 03/28/2022 CLINICAL DATA:  Fever cancer patient EXAM: CHEST - 2 VIEW COMPARISON:  03/22/2022, 01/23/2021, CT chest 04/04/2020, CT abdomen pelvis 07/15/2020 FINDINGS: Right-sided central venous port tip at the cavoatrial region. No acute airspace disease or effusion. Stable cardiomediastinal silhouette with aortic atherosclerosis. No pneumothorax. Clips in the bilateral axilla. IMPRESSION: No active cardiopulmonary disease. Electronically Signed   By: Donavan Foil M.D.   On: 03/28/2022 23:52   IR CV Line Injection  Result Date: 03/23/2022 INDICATION: Port site check. EXAM: ESTABLISHED PATIENT OFFICE VISIT CHIEF COMPLAINT: Please see epic note for full details. HISTORY OF PRESENT ILLNESS: Please see epic note for full details. REVIEW OF SYSTEMS: Please see epic note for full details. PHYSICAL EXAMINATION: Please see epic note for full details. ASSESSMENT AND PLAN: Please see epic note for full details. Electronically Signed   By: Ruthann Cancer M.D.   On: 03/23/2022 16:38   DG Chest Port 1 View  Result Date: 03/22/2022 CLINICAL DATA:  Port-A-Cath in place EXAM: PORTABLE CHEST 1 VIEW COMPARISON:  Radiograph 01/23/2021 FINDINGS: Left-sided port is been removed. Interval placement of a right chest port with catheter tip overlying the right atrium. Unchanged  cardiomediastinal silhouette. There is no focal airspace consolidation. There is no pleural effusion. No pneumothorax. There is no acute osseous abnormality. IMPRESSION: No evidence of acute cardiopulmonary disease. Right chest port catheter tip overlies the right atrium. Electronically Signed   By: Maurine Simmering M.D.   On: 03/22/2022 16:43     I spent approx 50 minutes for this patient encounter including review of prior medical records, coordination of care with primary/other specialist with greater than 50%  of time being face to face/counseling and discussing diagnostics/treatment plan with the patient/family.  Electronically signed by:   [name], MD Infectious Disease Physician [facility] for Infectious Disease Pager: [phone]

## 2022-04-09 NOTE — Plan of Care (Signed)
Pt requesting Xanax per [facility] for nausea/anxiety.  Meds administered per MAR.  No other complaints at this time.   Problem: Fluid Volume: Goal: Hemodynamic stability will improve Outcome: Progressing   Problem: Clinical Measurements: Goal: Diagnostic test results will improve Outcome: Progressing Goal: Signs and symptoms of infection will decrease Outcome: Progressing   Problem: Respiratory: Goal: Ability to maintain adequate ventilation will improve Outcome: Progressing   Problem: Education: Goal: Knowledge of General Education information will improve Description: Including pain rating scale, medication(s)/side effects and non-pharmacologic comfort measures Outcome: Progressing   Problem: Health Behavior/Discharge Planning: Goal: Ability to manage health-related needs will improve Outcome: Progressing   Problem: Clinical Measurements: Goal: Ability to maintain clinical measurements within normal limits will improve Outcome: Progressing Goal: Will remain free from infection Outcome: Progressing Goal: Diagnostic test results will improve Outcome: Progressing Goal: Respiratory complications will improve Outcome: Progressing Goal: Cardiovascular complication will be avoided Outcome: Progressing   Problem: Activity: Goal: Risk for activity intolerance will decrease Outcome: Progressing   Problem: Nutrition: Goal: Adequate nutrition will be maintained Outcome: Progressing   Problem: Coping: Goal: Level of anxiety will decrease Outcome: Progressing   Problem: Elimination: Goal: Will not experience complications related to bowel motility Outcome: Progressing Goal: Will not experience complications related to urinary retention Outcome: Progressing   Problem: Pain Managment: Goal: General experience of comfort will improve Outcome: Progressing   Problem: Safety: Goal: Ability to remain free from injury will improve Outcome: Progressing   Problem: Skin  Integrity: Goal: Risk for impaired skin integrity will decrease Outcome: Progressing

## 2022-04-09 NOTE — Discharge Summary (Signed)
Physician Discharge Summary  [name] ZOX:[id number] DOB: 10/11/1970 DOA: 04/05/2022  PCP: Nicholas Lose, MD  Admit date: 04/05/2022 Discharge date: 04/09/2022  Admitted From: Home Disposition: Home  Recommendations for Outpatient Follow-up:  Follow up with PCP in 1-2 weeks Follow-up with medical oncology and infectious disease as scheduled  Home Health: No Equipment/Devices: None, PICC line removed during this hospitalization  Discharge Condition: Stable CODE STATUS: DNR Diet recommendation:   History of present illness:  [name] is a 51 y.o. 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  Reported fever at home 103.9.  Patient reports cough without wheezing or dyspnea.  Also with occasional nausea without vomiting/diarrhea.  Denies chest pains, no palpitations, no dysuria, hematuria, no flank pain, no urinary urgency.   Recently admitted at [facility] for bacteremia due to Enterobacter species and discharged on 03/31/2022 with continue antibiotics with Levaquin 750 mg daily for 10 days from the time of her port removal.  During the hospitalization she had a PICC line placed in her left arm for history of poor venous access and need of multiple blood draws from her outpatient providers.  Patient reports compliance with the Levaquin and no missed doses.  No sick contacts  or other changes in home medications.   In the ED, temperature 102.1 F, HR 110, RR 29, BP 110/68, SPO2 100% on 3L [place] which is her baseline requirement.  WBC 15.2, hemoglobin 10.7, platelets 378.  Sodium 137, potassium 4.0, chloride 106, CO2 21, glucose 111, BUN 34, creatinine 1.53.  Lactic acid 1.3.  INR 1.2.  Urinalysis unrevealing.  Chest x-ray with no acute cardiopulmonary disease process.  Patient was started on IV vancomycin/cefepime.  Given 2.5 L IV normal saline bolus, Tylenol 650 mg p.o. x1, Ativan 0.5 mg IV x1 and Solu-Medrol 40 mg IV.  TRH consulted for further evaluation and management of fever concerning for sepsis with recurrent bacteremia.  Hospital course:  Sepsis, POA Recent Enterobacter aerogenes bacteremia Patient presenting to the ED with recurrent fever up to 103.9 at home.  On arrival to the ED temperature 102.1 F with tachycardia, tachypnea and elevated WBC count of 15.2 with evidence of acute endorgan damage with renal failure with creatinine 1.53 on admission.  Procalcitonin elevated 2.00.  Chest x-ray and urinalysis unrevealing.  Previous port site with dressing in place and new PICC line site left upper extremity with no concerning fluctuance/erythema.  Infectious disease was consulted and followed during hospital course.  Patient was initially started on vancomycin, cefepime and Flagyl.  WBC count improved to 9.3 at time of discharge.  Patient completed 11-day course from previous port removal and left-sided PICC line was removed during this hospitalization.  ID recommends no further antibiotics at this time and okay for discharge home.  Outpatient follow-up with infectious disease, Dr. Baxter Flattery.    Acute renal failure: Resolved Patient presenting with elevated creatinine of 1.53, likely secondary to prerenal azotemia in the setting of dehydration versus ATN from sepsis as above.  Received aggressive  IV fluid resuscitation in the ED. creatinine improved to 1.05 at time of  discharge.   Left flank pain Hx nephrolithiasis This morning, patient complaining of left flank pain, unable to get comfortable.  Reports some mild blood tinged urine.  Urinalysis on admission unrevealing.  Patient reports history of obstructive and nonobstructive nephrolithiasis.  CT renal stone study with no nephrolithiasis or obstructive uropathy.   Chronic hypoxemic respiratory failure History of asthma Follows with pulmonology outpatient, Dr. Loanne Drilling.  At baseline on 3-4 L nasal cannula.  Chest x-ray on admission with no acute cardiopulmonary disease process. Continue supplemental oxygen at baseline requirements   Chronic diastolic congestive heart failure, compensated Follows with cardiology outpatient, Dr. Acie Fredrickson.  Last TTE 05/2021 with LVEF 10-93%, grade 1 diastolic dysfunction.  Metoprolol succinate 37.5 mg p.o. daily, Bumex 105m PO BID.   Anxiety Major depressive disorder Wellbutrin 150 mg p.o. daily, Lexapro 20 mg p.o. daily, Xanax 0.5 mg p.o. twice daily as needed anxiety   Hx Addison's disease with adrenal insufficiency Hydrocortisone 20 mg p.o. daily, Hydrocortisone 10 mg p.o. nightly   GERD:  Dexilant 60 mg p.o. daily, Carafate as needed   Hypothyroidism --Levothyroxine 100 mcg p.o. daily   Hx migraine headache/seizure Topamax 150 mg p.o. daily, Lamictal 100 mg p.o. qHS   Hx breast cancer, non-Hodgkin's lymphoma Continue outpatient follow-up with medical oncology  Discharge Diagnoses:  Principal Problem:   Fever Active Problems:   Hypertension   Hyperlipidemia   Addison's disease ([place]   Anxiety and depression   GERD without esophagitis   Sepsis ([place]   PICC (peripherally inserted central catheter) in place    Discharge Instructions  Discharge Instructions     Call MD for:  difficulty breathing, headache or visual disturbances   Complete by: As directed    Call MD for:  extreme fatigue   Complete by: As directed    Call MD for:  persistant dizziness  or light-headedness   Complete by: As directed    Call MD for:  persistant nausea and vomiting   Complete by: As directed    Call MD for:  redness, tenderness, or signs of infection (pain, swelling, redness, odor or green/yellow discharge around incision site)   Complete by: As directed    Call MD for:  temperature >100.4   Complete by: As directed    Diet - low sodium heart healthy   Complete by: As directed    Increase activity slowly   Complete by: As directed    No wound care   Complete by: As directed       Allergies as of 04/09/2022       Reactions   Ferrous Bisglycinate Chelate [iron] Anaphylaxis   Only IV    Mushroom Extract Complex Anaphylaxis   Na Ferric Gluc Cplx In Sucrose Anaphylaxis   Cymbalta [duloxetine Hcl] Swelling, Anxiety   Phenergan [promethazine]    Other reaction(s): Unknown   Promethazine Hcl    Other reaction(s): Unknown   Succinylcholine Other (See Comments)   Lock Jaw   Buprenorphine Hcl Hives   Compazine Other (See Comments)   Altered mental status Aggression   Metoclopramide Other (See Comments)   Dystonia   Morphine And Related Hives   Ondansetron Hives, Rash   Other reaction(s): Unknown Other reaction(s): Unknown   Promethazine Hcl Hives   Tegaderm Ag Mesh [silver] Rash   Old formulation only, is able to tolerate new formulation        Medication List  STOP taking these medications    diphenhydrAMINE 50 MG/ML injection Commonly known as: BENADRYL   fluconazole 150 MG tablet Commonly known as: DIFLUCAN   levofloxacin 750 MG tablet Commonly known as: Levaquin       TAKE these medications    albuterol 108 (90 Base) MCG/ACT inhaler Commonly known as: VENTOLIN HFA Inhale 2 puffs into the lungs every 6 (six) hours as needed for wheezing or shortness of breath.   albuterol (2.5 MG/3ML) 0.083% nebulizer solution Commonly known as: PROVENTIL Take 3 mLs (2.5 mg total) by nebulization every 6 (six) hours as needed for  wheezing or shortness of breath.   ALPRAZolam 1 MG tablet Commonly known as: XANAX Take 1 mg by mouth 2 (two) times daily as needed for anxiety.   ALPRAZolam 0.5 MG tablet Commonly known as: XANAX Take 1 tablet (0.5 mg total) by mouth 2 (two) times daily as needed for anxiety.   aspirin 325 MG tablet Take 325 mg by mouth at bedtime.   bumetanide 1 MG tablet Commonly known as: BUMEX TAKE 1 TABLET BY MOUTH TWICE A DAY What changed: when to take this   buPROPion 150 MG 24 hr tablet Commonly known as: Wellbutrin XL Take 1 tablet (150 mg total) by mouth daily.   Cambia 50 MG Pack Generic drug: Diclofenac Potassium(Migraine) Take 1 packet by mouth daily as needed for migraine.   dexlansoprazole 60 MG capsule Commonly known as: DEXILANT Take 60 mg by mouth daily.   dronabinol 2.5 MG capsule Commonly known as: MARINOL TAKE 1 CAPSULE BY MOUTH TWICE DAILY AS NEEDED What changed: See the new instructions.   EPINEPHrine 0.3 mg/0.3 mL Soaj injection Commonly known as: EPI-PEN Inject 0.3 mg into the muscle as needed for anaphylaxis.   escitalopram 20 MG tablet Commonly known as: LEXAPRO Take 1 tablet (20 mg total) by mouth every evening.   fluticasone-salmeterol 230-21 MCG/ACT inhaler Commonly known as: Advair HFA Inhale 2 puffs into the lungs 2 (two) times daily.   Galcanezumab-gnlm 120 MG/ML Soaj Inject 120 mg into the skin every 28 (twenty-eight) days.   heparin lock flush 100 UNIT/ML Soln injection USE 5 MLS AS A HEPLOCK IN PORT A CATH ONCE DAILY AFTER MEDICATION ADMINISTRATION OR AS DIRECTED BY PHYSICIAN.   hydrocortisone 10 MG tablet Commonly known as: CORTEF Take 1-2 tablets (10-20 mg total) by mouth See admin instructions. Take 20 mg in the am and 24m in the evening What changed: additional instructions   lamoTRIgine 100 MG tablet Commonly known as: LAMICTAL Take one tablet at bedtime.   metoprolol succinate 25 MG 24 hr tablet Commonly known as:  TOPROL-XL Take 37.5 mg by mouth daily.   Normal Saline Flush 0.9 % Soln FLUSH WITH TWO 10 ML FLUSHES IN PORT BEFORE AND AFTER FLUIDS AND MEDICATION ADMINISTRATION, TO MAINTAIN PATENCY--USE UP TO 4 TIMES DAILY.   Nurtec 75 MG Tbdp Generic drug: Rimegepant Sulfate Take 75 mg by mouth daily as needed (Migraine).   OneTouch Delica Plus [id number] 3 (three) times daily. for testing   OneTouch Verio test strip Generic drug: glucose blood 3 (three) times daily. for testing   OXYGEN Inhale 3-4 L into the lungs continuous.   potassium chloride 10 MEQ tablet Commonly known as: KLOR-CON Take 1 tablet (10 mEq total) by mouth daily for 21 days.   predniSONE 10 MG tablet Commonly known as: DELTASONE Take 4 tabs x 2 days, then 3 x 2d, then 2 x 2d, then 1 x 2d and STOP  rosuvastatin 10 MG tablet Commonly known as: CRESTOR Take 10 mg by mouth daily.   saccharomyces boulardii 250 MG capsule Commonly known as: Florastor Take 1 capsule (250 mg total) by mouth 2 (two) times daily for 14 days.   sitaGLIPtin 100 MG tablet Commonly known as: Januvia Take 1 tablet (100 mg total) by mouth daily.   sucralfate 1 g tablet Commonly known as: CARAFATE Take 1 g by mouth daily as needed (FOR ULCERS).   Synthroid 100 MCG tablet Generic drug: levothyroxine Take 1 tablet (100 mcg total) by mouth every morning.   topiramate 50 MG tablet Commonly known as: TOPAMAX Take 150 mg by mouth at bedtime.   zolpidem 12.5 MG CR tablet Commonly known as: AMBIEN CR Take 12.5 mg by mouth at bedtime.        Follow-up Information     Nicholas Lose, MD Follow up.   Specialty: Hematology and Oncology Contact information: [place] [id number] [phone]         Nahser, Wonda Cheng, MD .   Specialty: Cardiology Contact information: [place] [address] [phone]         Carlyle Basques, MD. Schedule an appointment as soon as  possible for a visit.   Specialty: Infectious Diseases Contact information: [address] [place]  [address] [phone]                Allergies  Allergen Reactions   Ferrous Bisglycinate Chelate [Iron] Anaphylaxis    Only IV    Mushroom Extract Complex Anaphylaxis   Na Ferric Gluc Cplx In Sucrose Anaphylaxis   Cymbalta [Duloxetine Hcl] Swelling and Anxiety   Phenergan [Promethazine]     Other reaction(s): Unknown   Promethazine Hcl     Other reaction(s): Unknown   Succinylcholine Other (See Comments)    Lock Jaw   Buprenorphine Hcl Hives   Compazine Other (See Comments)    Altered mental status Aggression   Metoclopramide Other (See Comments)    Dystonia   Morphine And Related Hives   Ondansetron Hives and Rash    Other reaction(s): Unknown Other reaction(s): Unknown   Promethazine Hcl Hives   Tegaderm Ag Mesh [Silver] Rash    Old formulation only, is able to tolerate new formulation    Consultations: Infectious disease, Dr. West Bali, Dr. Gale Journey   Procedures/Studies: CT RENAL STONE STUDY  Result Date: 04/07/2022 CLINICAL DATA:  Flank pain. EXAM: CT ABDOMEN AND PELVIS WITHOUT CONTRAST TECHNIQUE: Multidetector CT imaging of the abdomen and pelvis was performed following the standard protocol without IV contrast. RADIATION DOSE REDUCTION: This exam was performed according to the departmental dose-optimization program which includes automated exposure control, adjustment of the mA and/or kV according to patient size and/or use of iterative reconstruction technique. COMPARISON:  July 15, 2020 FINDINGS: Lower chest: 9 mm subpleural pulmonary nodule is stable from 2016 and therefore consistent with benign etiology. Hepatobiliary: Several hypoattenuated lesions throughout the liver, too small to accurately characterize by CT. Normal gallbladder. Pancreas: Unremarkable. No pancreatic ductal dilatation or surrounding inflammatory changes. Spleen: Normal in  size without focal abnormality. Adrenals/Urinary Tract: Adrenal glands are unremarkable. Kidneys are normal, without renal calculi, focal lesion, or hydronephrosis. Bladder is unremarkable. Stomach/Bowel: Stomach is within normal limits. Post appendectomy. No evidence of bowel wall thickening, distention, or inflammatory changes. Vascular/Lymphatic: No significant vascular findings are present. No enlarged abdominal or pelvic lymph nodes. Reproductive: Status post hysterectomy. No adnexal masses. Other: No abdominal  wall hernia or abnormality. No abdominopelvic ascites. Musculoskeletal: Right L5-S1 pars articularis defect without alignment abnormalities. IMPRESSION: 1. No evidence of nephrolithiasis or obstructive uropathy. 2. Several hypoattenuated lesions throughout the liver, too small to accurately characterize by CT. 3. Right L5-S1 pars articularis defect without alignment abnormalities. Electronically Signed   By: Fidela Salisbury M.D.   On: 04/07/2022 14:18   VAS [place] UPPER EXTREMITY VENOUS DUPLEX  Result Date: 04/07/2022 UPPER VENOUS STUDY  Patient Name:  AERIAL DILLEY  Date of Exam:   04/06/2022 Medical Rec #: [id number]       Accession #:    [id number] Date of Birth: Sep 24, 1971       Patient Gender: F Patient Age:   73 years Exam Location:  [facility] Procedure:      VAS [place] UPPER EXTREMITY VENOUS DUPLEX Referring Phys: [name] --------------------------------------------------------------------------------  Indications: Pain Risk Factors: PICC line. Comparison Study: No prior studies. Performing Technologist: Oliver Hum RVT  Examination Guidelines: A complete evaluation includes B-mode imaging, spectral Doppler, color Doppler, and power Doppler as needed of all accessible portions of each vessel. Bilateral testing is considered an integral part of a complete examination. Limited examinations for reoccurring indications may be performed as noted.  Right Findings:  +----------+------------+---------+-----------+----------+-------+ RIGHT     CompressiblePhasicitySpontaneousPropertiesSummary +----------+------------+---------+-----------+----------+-------+ Subclavian    Full       Yes       Yes                      +----------+------------+---------+-----------+----------+-------+  Left Findings: +----------+------------+---------+-----------+----------+-------+ LEFT      CompressiblePhasicitySpontaneousPropertiesSummary +----------+------------+---------+-----------+----------+-------+ IJV           Full       Yes       Yes                      +----------+------------+---------+-----------+----------+-------+ Subclavian    Full       Yes       Yes                      +----------+------------+---------+-----------+----------+-------+ Axillary      Full       Yes       Yes                      +----------+------------+---------+-----------+----------+-------+ Brachial      Full       Yes       Yes                      +----------+------------+---------+-----------+----------+-------+ Radial        Full                                          +----------+------------+---------+-----------+----------+-------+ Ulnar         Full                                          +----------+------------+---------+-----------+----------+-------+ Cephalic      Full                                          +----------+------------+---------+-----------+----------+-------+  Basilic       Full                                          +----------+------------+---------+-----------+----------+-------+  Summary:  Right: No evidence of thrombosis in the subclavian.  Left: No evidence of deep vein thrombosis in the upper extremity. No evidence of superficial vein thrombosis in the upper extremity.  *See table(s) above for measurements and observations.  Diagnosing physician: Orlie Pollen Electronically signed by Orlie Pollen on 04/07/2022 at 11:16:54 AM.    Final    DG Chest Portable 1 View  Result Date: 04/05/2022 CLINICAL DATA:  Fever, recent PICC placement EXAM: PORTABLE CHEST 1 VIEW COMPARISON:  03/28/2022 FINDINGS: Single frontal view of the chest demonstrates interval removal of the right-sided chest wall port. Left-sided PICC tip projects over superior vena cava. Cardiac silhouette is unremarkable. No airspace disease, effusion, or pneumothorax. No acute bony abnormalities. IMPRESSION: 1. No acute intrathoracic process. Electronically Signed   By: Randa Ngo M.D.   On: 04/05/2022 21:32   IR PICC PLACEMENT LEFT >5 YRS INC IMG GUIDE  Result Date: 03/30/2022 CLINICAL DATA:  Bacteremia and need to remove indwelling right chest Port-A-Cath. Request to also place a PICC line for further IV access needs. EXAM: 1. LEFT UPPER EXTREMITY PICC LINE PLACEMENT WITH ULTRASOUND AND FLUOROSCOPIC GUIDANCE 2. REMOVAL OF RIGHT CHEST TUNNELED CENTRAL VENOUS PORT-A-CATH FLUOROSCOPY: 1.0 mGy MEDICATIONS: Moderate (conscious) sedation was employed during this procedure. A total of Versed 2.0 mg and Fentanyl 150 mcg was administered intravenously by radiology nursing. Moderate Sedation Time: 43 minutes. The patient's level of consciousness and vital signs were monitored continuously by radiology nursing throughout the procedure under my direct supervision. PROCEDURE: The patient was advised of the possible risks and complications and agreed to undergo the procedure. The patient was then brought to the angiographic suite for the procedure. A time-out was performed prior to initiating the procedure. The left arm was prepped with chlorhexidine, draped in the usual sterile fashion using maximum barrier technique (cap and mask, sterile gown, sterile gloves, large sterile sheet, hand hygiene and cutaneous antisepsis) and infiltrated locally with 1% Lidocaine. Ultrasound demonstrated patency of the left brachial vein, and this was documented  with an image. Under real-time ultrasound guidance, this vein was accessed with a 21 gauge micropuncture needle and image documentation was performed. A 0.018 wire was introduced in to the vein. Over this, a 5.0 [place] single lumen power injectable PICC was advanced to the lower SVC/right atrial junction. Fluoroscopy during the procedure and fluoro spot radiograph confirms appropriate catheter position. The catheter was flushed and covered with a sterile dressing. Catheter length: 43 cm The right chest port site was prepped with chlorhexidine. Local anesthesia was provided with 1% lidocaine. Utilizing sharp and blunt dissection, the subcutaneous chest port and attached catheter were removed in their entirety. The port pocket was irrigated with sterile saline. The wound was closed with subcutaneous 3-0 Monocryl and subcuticular 4-0 Vicryl. Dermabond was applied to the incision. FINDINGS: The port pocket was clean in appearance and not overtly infected. COMPLICATIONS: None IMPRESSION: 1. Placement single-lumen power injectable PICC line via left brachial vein with catheter tip at SVC/RA junction. 2. Removal of implanted right chest Port-A-Cath utilizing sharp and blunt dissection. The wound was closed. Electronically Signed   By: Aletta Edouard M.D.   On: 03/30/2022 17:21   IR REMOVAL  TUN ACCESS W/ PORT W/O FL MOD SED  Result Date: 03/30/2022 CLINICAL DATA:  Bacteremia and need to remove indwelling right chest Port-A-Cath. Request to also place a PICC line for further IV access needs. EXAM: 1. LEFT UPPER EXTREMITY PICC LINE PLACEMENT WITH ULTRASOUND AND FLUOROSCOPIC GUIDANCE 2. REMOVAL OF RIGHT CHEST TUNNELED CENTRAL VENOUS PORT-A-CATH FLUOROSCOPY: 1.0 mGy MEDICATIONS: Moderate (conscious) sedation was employed during this procedure. A total of Versed 2.0 mg and Fentanyl 150 mcg was administered intravenously by radiology nursing. Moderate Sedation Time: 43 minutes. The patient's level of consciousness and vital  signs were monitored continuously by radiology nursing throughout the procedure under my direct supervision. PROCEDURE: The patient was advised of the possible risks and complications and agreed to undergo the procedure. The patient was then brought to the angiographic suite for the procedure. A time-out was performed prior to initiating the procedure. The left arm was prepped with chlorhexidine, draped in the usual sterile fashion using maximum barrier technique (cap and mask, sterile gown, sterile gloves, large sterile sheet, hand hygiene and cutaneous antisepsis) and infiltrated locally with 1% Lidocaine. Ultrasound demonstrated patency of the left brachial vein, and this was documented with an image. Under real-time ultrasound guidance, this vein was accessed with a 21 gauge micropuncture needle and image documentation was performed. A 0.018 wire was introduced in to the vein. Over this, a 5.0 [place] single lumen power injectable PICC was advanced to the lower SVC/right atrial junction. Fluoroscopy during the procedure and fluoro spot radiograph confirms appropriate catheter position. The catheter was flushed and covered with a sterile dressing. Catheter length: 43 cm The right chest port site was prepped with chlorhexidine. Local anesthesia was provided with 1% lidocaine. Utilizing sharp and blunt dissection, the subcutaneous chest port and attached catheter were removed in their entirety. The port pocket was irrigated with sterile saline. The wound was closed with subcutaneous 3-0 Monocryl and subcuticular 4-0 Vicryl. Dermabond was applied to the incision. FINDINGS: The port pocket was clean in appearance and not overtly infected. COMPLICATIONS: None IMPRESSION: 1. Placement single-lumen power injectable PICC line via left brachial vein with catheter tip at SVC/RA junction. 2. Removal of implanted right chest Port-A-Cath utilizing sharp and blunt dissection. The wound was closed. Electronically Signed   By:  Aletta Edouard M.D.   On: 03/30/2022 17:21   DG Chest 2 View  Result Date: 03/28/2022 CLINICAL DATA:  Fever cancer patient EXAM: CHEST - 2 VIEW COMPARISON:  03/22/2022, 01/23/2021, CT chest 04/04/2020, CT abdomen pelvis 07/15/2020 FINDINGS: Right-sided central venous port tip at the cavoatrial region. No acute airspace disease or effusion. Stable cardiomediastinal silhouette with aortic atherosclerosis. No pneumothorax. Clips in the bilateral axilla. IMPRESSION: No active cardiopulmonary disease. Electronically Signed   By: Donavan Foil M.D.   On: 03/28/2022 23:52   IR CV Line Injection  Result Date: 03/23/2022 INDICATION: Port site check. EXAM: ESTABLISHED PATIENT OFFICE VISIT CHIEF COMPLAINT: Please see epic note for full details. HISTORY OF PRESENT ILLNESS: Please see epic note for full details. REVIEW OF SYSTEMS: Please see epic note for full details. PHYSICAL EXAMINATION: Please see epic note for full details. ASSESSMENT AND PLAN: Please see epic note for full details. Electronically Signed   By: Ruthann Cancer M.D.   On: 03/23/2022 16:38   DG Chest Port 1 View  Result Date: 03/22/2022 CLINICAL DATA:  Port-A-Cath in place EXAM: PORTABLE CHEST 1 VIEW COMPARISON:  Radiograph 01/23/2021 FINDINGS: Left-sided port is been removed. Interval placement of a right chest port with  catheter tip overlying the right atrium. Unchanged cardiomediastinal silhouette. There is no focal airspace consolidation. There is no pleural effusion. No pneumothorax. There is no acute osseous abnormality. IMPRESSION: No evidence of acute cardiopulmonary disease. Right chest port catheter tip overlies the right atrium. Electronically Signed   By: Maurine Simmering M.D.   On: 03/22/2022 16:43     Subjective: Patient seen examined bedside, resting comfortably.  Infectious disease at bedside, Dr. West Bali and ID pharmacist; okay for discharge home off of all antibiotics given completed 11-day course from previous port removal.   Blood cultures remain negative to date.  Patient excited about discharging home today.  No other complaints or concerns at this time.  Denies headache, no chest pain, no palpitations, no shortness of breath than her typical baseline, no fever/chills/night sweats, no nausea/vomiting/diarrhea, no abdominal pain, no focal weakness, no fatigue, no cough/congestion, no paresthesias.  No acute events overnight per nursing staff.  Discharge Exam: Vitals:   04/09/22 0619 04/09/22 0921  BP: 108/63   Pulse: 76   Resp: 20   Temp: 98.7 F (37.1 C)   SpO2: 99% 99%   Vitals:   04/08/22 2159 04/08/22 2215 04/09/22 0619 04/09/22 0921  BP: 125/80 113/73 108/63   Pulse: 76 81 76   Resp: _0 Temp: 98.1 F (36.7 C) 98.5 F (36.9 C) 98.7 F (37.1 C)   TempSrc: Oral Oral Oral   SpO2: 96% 99% 99% 99%    Physical Exam: GEN: NAD, alert and oriented x 3, chronically ill appearance, appears older than stated age HEENT: NCAT, PERRL, EOMI, sclera clear, MMM PULM: CTAB w/o wheezes/crackles, normal respiratory effort, on 3 L nasal cannula which is her baseline CV: RRR w/o M/G/R GI: abd soft, NTND, NABS, no R/G/M MSK: no peripheral edema, moves all extremities independently, noted left index finger amputation NEURO: CN II-XII intact, no focal deficits, sensation to light touch intact PSYCH: normal mood/affect Integumentary: Noted previous right upper chest port site and left upper extremity PICC site without any surrounding erythema/fluctuance, no other concerning rashes/lesions/wounds    The results of significant diagnostics from this hospitalization (including imaging, microbiology, ancillary and laboratory) are listed below for reference.     Microbiology: Recent Results (from the past 240 hour(s))  Blood culture (routine x 2)     Status: None (Preliminary result)   Collection Time: 04/05/22  8:47 PM   Specimen: BLOOD  Result Value Ref Range Status   Specimen Description   Final    BLOOD  BOTTLES DRAWN AEROBIC AND ANAEROBIC Performed at [facility], [place] [address] [place], [place] [address]    Special Requests   Final    Blood Culture results may not be optimal due to an excessive volume of blood received in culture bottles BLOOD LEFT ARM Performed at [facility] [address] [place], [place] [address]    Culture   Final    NO GROWTH 4 DAYS Performed at [facility], [place] [address], [place], [place] [address]    Report Status PENDING  Incomplete  Blood culture (routine x 2)     Status: None (Preliminary result)   Collection Time: 04/05/22  8:52 PM   Specimen: BLOOD  Result Value Ref Range Status   Specimen Description   Final    BLOOD BOTTLES DRAWN AEROBIC AND ANAEROBIC Performed at [facility] [address] [place], [place] [address]    Special Requests   Final  Blood Culture adequate volume BACTERIAL CASTS Performed at [facility] [address], [place], [place] [address]    Culture   Final    NO GROWTH 4 DAYS Performed at [facility], [place] [address] [place], [place] [address]    Report Status PENDING  Incomplete     Labs: BNP (last 3 results) No results for input(s): "BNP" in the last 8760 hours. Basic Metabolic Panel: Recent Labs  Lab 04/05/22 1854 04/06/22 0546 04/07/22 0423  NA 137 137 139  K 4.0 4.0 3.6  CL 106 112* 107  CO2 21* 18* 23  GLUCOSE 111* 151* 135*  BUN 34* 27* 29*  CREATININE 1.53* 0.98 1.04*  CALCIUM 9.4 8.0* 8.3*   Liver Function Tests: Recent Labs  Lab 04/05/22 1854  AST 29  ALT 43  ALKPHOS 86  BILITOT 0.6  PROT 8.3*  ALBUMIN 4.0   No results for input(s): "LIPASE", "AMYLASE" in the last 168 hours. No results for input(s): "AMMONIA" in the last 168 hours. CBC: Recent Labs  Lab 04/05/22 1854 04/06/22 0546 04/07/22 0423  WBC 15.2* 11.0* 9.3  NEUTROABS 12.8*  --   --   HGB 10.7* 8.9* 9.0*  HCT 34.9* 29.7*  30.2*  MCV 77.2* 79.0* 79.1*  PLT 378 284 308   Cardiac Enzymes: No results for input(s): "CKTOTAL", "CKMB", "CKMBINDEX", "TROPONINI" in the last 168 hours. BNP: Invalid input(s): "POCBNP" CBG: No results for input(s): "GLUCAP" in the last 168 hours. D-Dimer No results for input(s): "DDIMER" in the last 72 hours. Hgb A1c No results for input(s): "HGBA1C" in the last 72 hours. Lipid Profile No results for input(s): "CHOL", "HDL", "LDLCALC", "TRIG", "CHOLHDL", "LDLDIRECT" in the last 72 hours. Thyroid function studies No results for input(s): "TSH", "T4TOTAL", "T3FREE", "THYROIDAB" in the last 72 hours.  Invalid input(s): "FREET3" Anemia work up No results for input(s): "VITAMINB12", "FOLATE", "FERRITIN", "TIBC", "IRON", "RETICCTPCT" in the last 72 hours. Urinalysis    Component Value Date/Time   COLORURINE YELLOW 04/05/2022 1842   APPEARANCEUR CLEAR 04/05/2022 1842   LABSPEC 1.018 04/05/2022 1842   PHURINE 5.0 04/05/2022 1842   GLUCOSEU NEGATIVE 04/05/2022 1842   HGBUR NEGATIVE 04/05/2022 1842   BILIRUBINUR NEGATIVE 04/05/2022 1842   KETONESUR NEGATIVE 04/05/2022 1842   PROTEINUR 30 (A) 04/05/2022 1842   UROBILINOGEN 1.0 06/16/2015 1045   NITRITE NEGATIVE 04/05/2022 1842   LEUKOCYTESUR NEGATIVE 04/05/2022 1842   Sepsis Labs Recent Labs  Lab 04/05/22 1854 04/06/22 0546 04/07/22 0423  WBC 15.2* 11.0* 9.3   Microbiology Recent Results (from the past 240 hour(s))  Blood culture (routine x 2)     Status: None (Preliminary result)   Collection Time: 04/05/22  8:47 PM   Specimen: BLOOD  Result Value Ref Range Status   Specimen Description   Final    BLOOD BOTTLES DRAWN AEROBIC AND ANAEROBIC Performed at [facility], [place] [address], [place], [place] [address]    Special Requests   Final    Blood Culture results may not be optimal due to an excessive volume of blood received in culture bottles BLOOD LEFT ARM Performed at [place] [facility] [address] [place], [place] [address]    Culture   Final    NO GROWTH 4 DAYS Performed at [facility], [place] [address], [place], [place] [address]    Report Status PENDING  Incomplete  Blood culture (routine x 2)     Status: None (Preliminary result)  Collection Time: 04/05/22  8:52 PM   Specimen: BLOOD  Result Value Ref Range Status   Specimen Description   Final    BLOOD BOTTLES DRAWN AEROBIC AND ANAEROBIC Performed at [facility] [address], [place], [place] [address]    Special Requests   Final    Blood Culture adequate volume BACTERIAL CASTS Performed at [facility] [address], [place], [place] [address]    Culture   Final    NO GROWTH 4 DAYS Performed at [facility], [place] [address] [place], [place] [address]    Report Status PENDING  Incomplete     Time coordinating discharge: Over 30 minutes  SIGNED:   Silva Aamodt J [place] DO  [facility] 04/09/2022, 10:42 AM

## 2022-04-10 LAB — CULTURE, BLOOD (ROUTINE X 2)
Culture: NO GROWTH
Culture: NO GROWTH
Special Requests: ADEQUATE

## 2022-04-11 ENCOUNTER — Encounter: Payer: Self-pay | Admitting: Internal Medicine

## 2022-04-12 ENCOUNTER — Encounter: Payer: Self-pay | Admitting: Internal Medicine

## 2022-04-12 ENCOUNTER — Telehealth (INDEPENDENT_AMBULATORY_CARE_PROVIDER_SITE_OTHER): Payer: BC Managed Care – PPO | Admitting: Psychiatry

## 2022-04-12 DIAGNOSIS — F331 Major depressive disorder, recurrent, moderate: Secondary | ICD-10-CM

## 2022-04-12 NOTE — Progress Notes (Addendum)
[facility] Counselor/Therapist Progress Note  Patient ID: [name], MRN: [id number],    Date: 04/12/2022  Virtual Visit via Telehealth Note: [place] with patient by a telemedicine/telehealth application, with their informed consent, and verified patient privacy and that I am speaking with the correct person using two identifiers. I discussed the limitations, risks, security and privacy concerns of performing psychotherapy and the availability of in person appointments. I also discussed with the patient that there may be a patient responsible charge related to this service. The patient expressed understanding and agreed to proceed. I discussed the treatment planning with the patient. The patient was provided an opportunity to ask questions and all were answered. The patient agreed with the plan and demonstrated an understanding of the instructions. The patient was advised to call  our office if  symptoms worsen or feel they are in a crisis state and need immediate contact.   Therapist Location: office Patient Location: home   Time Spent: 50 minutes   Treatment Type: Individual Therapy  Reported Symptoms: anxiety, fears (but better), some depression  Mental Status Exam:  Appearance:   Casual     Behavior:  Appropriate, Sharing, and motivation is challenging  Motor:  Normal  Speech/Language:   Clear and Coherent  Affect:  Depressed and anxious  Mood:  anxious and depressed  Thought process:  goal directed  Thought content:    Some overthinkings  Sensory/Perceptual disturbances:    WNL  Orientation:  oriented to person, place, time/date, situation, day of week, month of year, year, and stated date of April 12, 2022  Attention:  Good  Concentration:  Good and Fair  Memory:  WNL  Fund of knowledge:   Fair  Insight:    Fair  Judgment:   Good  Impulse Control:  Good   Risk Assessment: Danger to Self:  No Self-injurious Behavior: No Danger to  Others: No Duty to Warn:no Physical Aggression / Violence:No  Access to Firearms a concern: No  Gang Involvement:No   Subjective:  Patient today reporting symptoms of anxiety, some depression, some panic at times, and feelings of inadequacy, and feelings of indifference at times. Denies SI. 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 Discharged to her home where she is recuperating and is still receiving some some treatment at home. Needed session today to process her thoughts/feelings of most recent medical crises, which she described as "life-changing" and "wasn't sure I would survive and make it home." Shared how neighbors have supported her in multiple ways. Processed some issues with her dad including physical issues, being quite frail,  and some dementia. Acknowledged some resentment along with anxiety, some anger, and depression. Expressed some anxieties and some apprehension which seemed to feel helpful.  Interventions: Cognitive Behavioral Therapy  Treatment goals: Treatment goals remain on treatment plan as patient works with strategies to achieve her goals.  Progress is assessed each session and documented in the "subject" and/or "plan" section of treatment note. Long-term goal: Elevate mood and show evidence of usual energy, activities, and socialization level. Help her determine what she feels is realistic for her.  Short-term goal: Participate in social contacts and initiate communication of needs and desires.  Strategies: Describe the signs and symptoms of depression that are experienced and work to find and use helpful coping tools to better manage any depressed feelings.    Diagnosis:   ICD-10-CM   1. Major depressive  disorder, recurrent episode, moderate ([place])  F33.1      Plan:  Patient today showing some motivation and  good participation in session as she focused on coping with her multiple health issues as  noted above.  Was able to share and process a lot of anxieties, family concerns, and apprehension.  She has said previously that it is hard for her to feel supported by other people but does feel support from her dad and a brother.  We are rescheduling another telehealth appointment within the next 2 to 3 weeks per her request.  Goal review and progress/challenges noted with patient.  Next appointment within 3 weeks.  This record has been created using [employer].  Chart creation errors have been sought, but may not always have been located and corrected.  Such creation errors do not reflect on the standard of medical care provided.   Shanon Ace, LCSW

## 2022-04-14 ENCOUNTER — Other Ambulatory Visit: Payer: Self-pay | Admitting: Hematology and Oncology

## 2022-04-14 ENCOUNTER — Other Ambulatory Visit: Payer: Self-pay | Admitting: Internal Medicine

## 2022-04-14 DIAGNOSIS — B379 Candidiasis, unspecified: Secondary | ICD-10-CM

## 2022-04-16 ENCOUNTER — Encounter: Payer: Self-pay | Admitting: Hematology and Oncology

## 2022-04-16 NOTE — Telephone Encounter (Signed)
Please advise

## 2022-04-18 ENCOUNTER — Other Ambulatory Visit: Payer: Self-pay | Admitting: *Deleted

## 2022-04-18 DIAGNOSIS — Z86000 Personal history of in-situ neoplasm of breast: Secondary | ICD-10-CM

## 2022-04-19 ENCOUNTER — Inpatient Hospital Stay: Payer: BC Managed Care – PPO

## 2022-04-19 ENCOUNTER — Inpatient Hospital Stay: Payer: BC Managed Care – PPO | Admitting: Hematology and Oncology

## 2022-04-19 ENCOUNTER — Telehealth: Payer: Self-pay | Admitting: Hematology and Oncology

## 2022-04-19 ENCOUNTER — Other Ambulatory Visit: Payer: Self-pay | Admitting: *Deleted

## 2022-04-19 ENCOUNTER — Encounter: Payer: Self-pay | Admitting: Hematology and Oncology

## 2022-04-19 DIAGNOSIS — Z86000 Personal history of in-situ neoplasm of breast: Secondary | ICD-10-CM

## 2022-04-19 NOTE — Telephone Encounter (Signed)
.  Called patient to schedule appointment per 7/20 inbasket, patient is aware of date and time.

## 2022-04-19 NOTE — Progress Notes (Signed)
Per MD request, RN placed orders for bone scan.  Appt will be scheduled once pa is obtained.

## 2022-04-19 NOTE — Assessment & Plan Note (Deleted)
History of DCIS, thyroid cancer, cervical cancer, Hodgkin's lymphoma: In remission Lymphedema: Currently on Bumex. Severe depression: Patient seeing psychologist and psychiatrist at [facility] psychiatry. She is currently also on Wellbutrin.  Hospitalization 04/05/2022-04/09/2022: Sepsis with Enterobacter treated with antibiotic currently has a PICC line.  Acute renal failure resolved

## 2022-04-27 MED ORDER — FLUCONAZOLE 150 MG PO TABS: ORAL_TABLET | ORAL | 0 refills | Status: DC

## 2022-04-27 NOTE — Addendum Note (Signed)
Addended by: Lucie Leather D on: 04/27/2022 12:30 PM   Modules accepted: Orders

## 2022-05-01 ENCOUNTER — Ambulatory Visit (INDEPENDENT_AMBULATORY_CARE_PROVIDER_SITE_OTHER): Payer: BC Managed Care – PPO | Admitting: Psychiatry

## 2022-05-01 DIAGNOSIS — F331 Major depressive disorder, recurrent, moderate: Secondary | ICD-10-CM

## 2022-05-01 NOTE — Progress Notes (Signed)
[facility] Counselor/Therapist Progress Note  Patient ID: [name], MRN: [id number],    Date: 05/01/2022  Time Spent: 50 minutes  Treatment Type: Individual Therapy  Virtual Visit via Telehealth Note: MyChart Video session Connected with patient by a telemedicine/telehealth application, with their informed consent, and verified patient privacy and that I am speaking with the correct person using two identifiers. I discussed the limitations, risks, security and privacy concerns of performing psychotherapy and the availability of in person appointments. I also discussed with the patient that there may be a patient responsible charge related to this service. The patient expressed understanding and agreed to proceed. I discussed the treatment planning with the patient. The patient was provided an opportunity to ask questions and all were answered. The patient agreed with the plan and demonstrated an understanding of the instructions. The patient was advised to call  our office if  symptoms worsen or feel they are in a crisis state and need immediate contact.   Therapist Location: office Patient Location: home   Reported Symptoms: depression, lonely, anxiety, dealing with multiple family relationship issues with parents and brother  Mental Status Exam:  Appearance:   Casual     Behavior:  Appropriate and Sharing  Motor:  Normal  Speech/Language:   Clear and Coherent  Affect:  Some depression and anxiety  Mood:  anxious and depressed  Thought process:  goal directed  Thought content:    Some rumination and overthinking  Sensory/Perceptual disturbances:    WNL  Orientation:  oriented to person, place, time/date, situation, day of week, month of year, year, and stated date of Aug. 1, 2023  Attention:  Good  Concentration:  Good and Fair  Memory:  WNL  Fund of knowledge:   Good  Insight:    Good and Fair  Judgment:   Good  Impulse Control:  Good   Risk Assessment: Danger to  Self:  No Self-injurious Behavior: No Danger to Others: No Duty to Warn:no Physical Aggression / Violence:No  Access to Firearms a concern: No  Gang Involvement:No   Subjective: Patient today reporting depression, anxiety, frustration mostly related to her congestive heart failure with chronic pulmonary issues. States her pulmonary issues "are getting worse". "When needed health workers come to the home." Had infection in bloodstream and doctors decided to take a "port vacation and took my central line out." States she expects the port will be replaced sometime soon. Needed session to more thoroughly share and discuss family dynamics and relationships that are very hurtful and tend to have ongoing impact for patient. "Told me I talk too much." Reports having good neighbors that have been helpful in various ways to patient.  Seemed to appreciate the ability to talk through her anxiety and also the difficult family relationships and behaviors.  Stays in contact with those who she feels are more supportive.  Continues to have some resentment in the midst of certain relationships, which she shared and openly spoke about.  (Not all details included in this note due to patient privacy needs).  Interventions: Cognitive Behavioral Therapy and Ego-Supportive  Treatment goals: Treatment goals remain on treatment plan as patient works with strategies to achieve her goals.  Progress is assessed each session and documented in the "subject" and/or "plan" section of treatment note. Long-term goal: Elevate mood and show evidence of usual energy, activities, and socialization level. Help her determine what she feels is realistic for her considering her health challenges. Short-term goal: Participate in  social contacts and initiate communication of needs and desires.  Strategies: Describe the signs and symptoms of depression that are experienced and work to find and use helpful coping tools to better manage  depressed feelings.   Diagnosis:   ICD-10-CM   1. Major depressive disorder, recurrent episode, moderate ([place])  F33.1      Plan: Patient today showing good participation talking very openly most of session. Good for her to be able to share openly and feel heard.  Talked through some of the issues that are feeding her depression, anxiety, and frustration which seemed very helpful to patient.  Considerable dysfunction within the family but does have a few family members that seem to be more supportive.  Continues getting care and treatment for multiple physical concerns adding that her frustrations have been more with her congestive heart failure and chronic pulmonary issues.  Pleased with medical staff attending to her.  Highlighted her relationship with a brother and her dad, and also a daughter including holding up pictures to share.  Encouraged her continued focus on goal directed behaviors and especially her own self care and asking for what she needs from people that she knows will be helpful.  Rescheduling for another telehealth appointment within approximately 3 weeks.   Goal review and progress/challenges noted with patient.  Next appointment within 3 weeks.  This record has been created using [employer].  Chart creation errors have been sought, but may not always have been located and corrected.  Such creation errors do not reflect on the standard of medical care provided.   Shanon Ace, LCSW

## 2022-05-03 ENCOUNTER — Inpatient Hospital Stay: Payer: BC Managed Care – PPO | Attending: Hematology and Oncology

## 2022-05-03 DIAGNOSIS — Z8571 Personal history of Hodgkin lymphoma: Secondary | ICD-10-CM | POA: Insufficient documentation

## 2022-05-03 DIAGNOSIS — R609 Edema, unspecified: Secondary | ICD-10-CM | POA: Insufficient documentation

## 2022-05-03 DIAGNOSIS — Z8541 Personal history of malignant neoplasm of cervix uteri: Secondary | ICD-10-CM | POA: Insufficient documentation

## 2022-05-03 DIAGNOSIS — Z85850 Personal history of malignant neoplasm of thyroid: Secondary | ICD-10-CM | POA: Insufficient documentation

## 2022-05-03 DIAGNOSIS — Z86000 Personal history of in-situ neoplasm of breast: Secondary | ICD-10-CM | POA: Insufficient documentation

## 2022-05-04 ENCOUNTER — Other Ambulatory Visit: Payer: Self-pay | Admitting: Hematology and Oncology

## 2022-05-04 NOTE — Telephone Encounter (Signed)
Dr. Lindi Adie, I was not sure if you wanted to continue refilling this medication for her or if she should have her PCP refill now that she is established with one.

## 2022-05-07 ENCOUNTER — Encounter (HOSPITAL_COMMUNITY)
Admission: RE | Admit: 2022-05-07 | Discharge: 2022-05-07 | Disposition: A | Discharge status: Home / Self Care | Payer: BC Managed Care – PPO | Source: Ambulatory Visit | Attending: Hematology and Oncology | Admitting: Hematology and Oncology

## 2022-05-07 DIAGNOSIS — Z86000 Personal history of in-situ neoplasm of breast: Secondary | ICD-10-CM | POA: Insufficient documentation

## 2022-05-07 DIAGNOSIS — C50919 Malignant neoplasm of unspecified site of unspecified female breast: Secondary | ICD-10-CM | POA: Diagnosis not present

## 2022-05-07 MED ORDER — TECHNETIUM TC 99M MEDRONATE IV KIT
20.0000 | PACK | Freq: Once | INTRAVENOUS | Status: AC | PRN
  Administered 2022-05-07: 18.1 via INTRAVENOUS

## 2022-05-09 ENCOUNTER — Ambulatory Visit (HOSPITAL_COMMUNITY): Payer: BC Managed Care – PPO

## 2022-05-16 ENCOUNTER — Encounter: Payer: Self-pay | Admitting: Hematology and Oncology

## 2022-05-16 ENCOUNTER — Ambulatory Visit (HOSPITAL_COMMUNITY): Payer: BC Managed Care – PPO

## 2022-05-17 ENCOUNTER — Inpatient Hospital Stay: Payer: BC Managed Care – PPO

## 2022-05-17 ENCOUNTER — Inpatient Hospital Stay: Payer: BC Managed Care – PPO | Admitting: Hematology and Oncology

## 2022-05-22 DIAGNOSIS — D443 Neoplasm of uncertain behavior of pituitary gland: Secondary | ICD-10-CM | POA: Diagnosis not present

## 2022-05-22 DIAGNOSIS — E2740 Unspecified adrenocortical insufficiency: Secondary | ICD-10-CM | POA: Diagnosis not present

## 2022-05-22 DIAGNOSIS — E1169 Type 2 diabetes mellitus with other specified complication: Secondary | ICD-10-CM | POA: Diagnosis not present

## 2022-05-22 DIAGNOSIS — E890 Postprocedural hypothyroidism: Secondary | ICD-10-CM | POA: Diagnosis not present

## 2022-05-22 DIAGNOSIS — M8580 Other specified disorders of bone density and structure, unspecified site: Secondary | ICD-10-CM | POA: Diagnosis not present

## 2022-05-25 ENCOUNTER — Ambulatory Visit (HOSPITAL_COMMUNITY): Payer: BC Managed Care – PPO

## 2022-05-25 ENCOUNTER — Encounter: Payer: Self-pay | Admitting: Hematology and Oncology

## 2022-05-27 ENCOUNTER — Other Ambulatory Visit: Payer: Self-pay | Admitting: Adult Health

## 2022-05-28 ENCOUNTER — Inpatient Hospital Stay: Payer: BC Managed Care – PPO

## 2022-05-28 ENCOUNTER — Telehealth: Payer: Self-pay | Admitting: *Deleted

## 2022-05-28 ENCOUNTER — Inpatient Hospital Stay: Payer: BC Managed Care – PPO | Admitting: Hematology and Oncology

## 2022-05-28 NOTE — Telephone Encounter (Signed)
Due to MD being out of the office, appt for today rescheduled.  RN attempt x1 to contact pt with update appt date and time.  No answer, LVM with appt details.

## 2022-05-29 ENCOUNTER — Inpatient Hospital Stay: Payer: BC Managed Care – PPO

## 2022-05-29 ENCOUNTER — Other Ambulatory Visit: Payer: Self-pay

## 2022-05-29 ENCOUNTER — Other Ambulatory Visit: Payer: Self-pay | Admitting: Hematology and Oncology

## 2022-05-29 ENCOUNTER — Inpatient Hospital Stay (HOSPITAL_BASED_OUTPATIENT_CLINIC_OR_DEPARTMENT_OTHER): Payer: BC Managed Care – PPO | Admitting: Hematology and Oncology

## 2022-05-29 ENCOUNTER — Ambulatory Visit (INDEPENDENT_AMBULATORY_CARE_PROVIDER_SITE_OTHER): Payer: BC Managed Care – PPO | Admitting: Psychiatry

## 2022-05-29 DIAGNOSIS — Z8541 Personal history of malignant neoplasm of cervix uteri: Secondary | ICD-10-CM | POA: Diagnosis not present

## 2022-05-29 DIAGNOSIS — Z85850 Personal history of malignant neoplasm of thyroid: Secondary | ICD-10-CM | POA: Diagnosis not present

## 2022-05-29 DIAGNOSIS — Z8571 Personal history of Hodgkin lymphoma: Secondary | ICD-10-CM

## 2022-05-29 DIAGNOSIS — R609 Edema, unspecified: Secondary | ICD-10-CM | POA: Diagnosis not present

## 2022-05-29 DIAGNOSIS — Z86000 Personal history of in-situ neoplasm of breast: Secondary | ICD-10-CM

## 2022-05-29 DIAGNOSIS — F331 Major depressive disorder, recurrent, moderate: Secondary | ICD-10-CM | POA: Diagnosis not present

## 2022-05-29 NOTE — Progress Notes (Signed)
[facility] Counselor/Therapist Progress Note  Patient ID: [name], MRN: [id number],    Date: 05/29/2022  Time Spent: 50 minutes  Virtual Visit via Telehealth Note: [place] with patient by a telemedicine/telehealth application, with their informed consent, and verified patient privacy and that I am speaking with the correct person using two identifiers. I discussed the limitations, risks, security and privacy concerns of performing psychotherapy and the availability of in person appointments. I also discussed with the patient that there may be a patient responsible charge related to this service. The patient expressed understanding and agreed to proceed. I discussed the treatment planning with the patient. The patient was provided an opportunity to ask questions and all were answered. The patient agreed with the plan and demonstrated an understanding of the instructions. The patient was advised to call  our office if  symptoms worsen or feel they are in a crisis state and need immediate contact.   Therapist Location: office Patient Location: home  Treatment Type: Individual Therapy  Reported Symptoms: depression, anxiety  Mental Status Exam:  Appearance:   Casual     Behavior:  Appropriate and Sharing  Motor:  Impacted by her health issues  Speech/Language:   Clear and Coherent  Affect:  Depressed and anxious  Mood:  anxious and depressed  Thought process:  goal directed  Thought content:    Rumination and overthinking  Sensory/Perceptual disturbances:    WNL  Orientation:  oriented to person, place, time/date, situation, day of week, month of year, year, and stated date of May 29, 2022  Attention:  Good  Concentration:  Good and Fair  Memory:  WNL  Fund of knowledge:   Good  Insight:    Good and Fair  Judgment:   Good  Impulse Control:  Good   Risk Assessment: Danger to Self:  No Self-injurious Behavior: No Danger to Others: No Duty to  Warn:no Physical Aggression / Violence:No  Access to Firearms a concern: No  Gang Involvement:No   Subjective: Patient today reporting depression, anxiety regarding her own health issues, and encountering some concerns with her 51 yr old "on the spectrum". Struggles with hopelessness due to physical health concerns but denies any SI. She and husband having difficult time with frequent arguing. Needed session today to discuss some of her communication/relationship issues with husband, her health challenges, and other family concerns. Neighbors interested and helpful. Dad also having significant health problems. Does stay in contact with supportive people.  Looked at other ways that she can get needed support that can help her cope with day-to-day.  States her Dx is "Multiple Primary Malignant Neoplasms, along with chronic pulmonary failure. "  (Not all details included in this note due to patient privacy needs).  Appreciated the time today to speak privately about her anxiety, fears, and frustrations related to health/personal/family.  Interventions: Cognitive Behavioral Therapy and Ego-Supportive  Treatment goals: Treatment goals remain on treatment plan as patient works with strategies to achieve her goals.  Progress is assessed each session and documented in the "subject" and/or "plan" section of treatment note. Long-term goal: Elevate mood and show evidence of usual energy, activities, and socialization level. Help her determine what she feels is realistic for her considering her health challenges. Short-term goal: Participate in social contacts and initiate communication of needs and desires.  Strategies: Describe the signs and symptoms of depression that are experienced and work to find and use helpful coping tools to better manage depressed feelings.  Diagnosis:   ICD-10-CM   1. Major depressive disorder, recurrent episode, moderate (HCC)  F33.1      Plan:  Today patient showing  motivation and good participation as she focused on some venting that she needed to be able to do regarding her fears, frustrations, anxiety, and depression.  Also focused on ways of her feeling supported and being able to ask for what she needs.  Does share openly in session and seems to feel heard.  Knows that she cannot change a lot of things in her situation but is open to the support she receives in our sessions as well as being able to vent, and take advantage of suggestions we arrive at that can help her identify what she can change and what she cannot change, and cope more effectively with the benefit of feeling supported.  Family dysfunction makes this process even more challenging for patient.  Brother does remain a stronger support for her per patient report, however the family issues sometimes affect that relationship as well, but as patient states "not for very long as I and brother have been closed over the years".  Encouraged patient's focus on goal directed behaviors and especially her own self-care, freely asking for what she needs from people that she knows will respond and be helpful to her.  Is scheduling another appointment within 3 to 4 weeks which she hopes to be able to come in person for that appointment.  Goal review and progress/challenges noted with patient.  Next appointment within 3-4 weeks.  This record has been created using [employer].  Chart creation errors have been sought, but may not always have been located and corrected.  Such creation errors do not reflect on the standard of medical care provided.   Shanon Ace, LCSW

## 2022-05-29 NOTE — Progress Notes (Addendum)
Patient Care Team: Nicholas Lose, MD as PCP - General (Hematology and Oncology) Nahser, Wonda Cheng, MD as PCP - Cardiology (Cardiology) Maylon Peppers, MD (Family Medicine)  DIAGNOSIS:  Encounter Diagnosis  Name Primary?   Hx of Hodgkins lymphoma     SUMMARY OF ONCOLOGIC HISTORY: Oncology History  Hx of Hodgkins lymphoma   Initial Diagnosis   Hx of Hodgkins lymphoma    Radiation Therapy     History of thyroid cancer   Initial Diagnosis   Thyroid cancer (HCC)    Remission      Remission      Radiation Therapy     History of cervical cancer  12/23/2018 Initial Diagnosis   History of cervical cancer    Remission      Radiation Therapy       CHIEF COMPLIANT: Follow-up hodgkins lymphoma  INTERVAL HISTORY: [name] is a 51 y.o. with above-mentioned history of SVT currently on surveillance and fingers. breast cancer, thyroid cancer, cervical cancer, and Hodgkins lymphoma. She presents to the clinic today for follow-up. She just recently got out the hospital from been septic. She had a fever of 103.  . She complains of bruising easily. She is wearing her compression gloves everyday. She is tearful that the IV access has been removed and she is an extremely hard stick we could not draw her labs today in spite of multiple attempts.   ALLERGIES:  is allergic to ferrous bisglycinate chelate [iron], mushroom extract complex, na ferric gluc cplx in sucrose, cymbalta [duloxetine hcl], ondansetron hcl, promethazine, promethazine hcl, succinylcholine, buprenorphine hcl, compazine, metoclopramide, morphine and related, ondansetron, promethazine hcl, and tegaderm ag mesh [silver].  MEDICATIONS:  Current Outpatient Medications  Medication Sig Dispense Refill   albuterol (VENTOLIN HFA) 108 (90 Base) MCG/ACT inhaler Inhale 2 puffs into the lungs every 6 (six) hours as needed for wheezing or shortness of breath.     ALPRAZolam (XANAX) 0.5 MG tablet Take 1 tablet (0.5 mg total) by  mouth 2 (two) times daily as needed for anxiety. 60 tablet 2   ALPRAZolam (XANAX) 1 MG tablet Take 1 mg by mouth 2 (two) times daily as needed for anxiety.     aspirin 325 MG tablet Take 325 mg by mouth at bedtime.      bumetanide (BUMEX) 1 MG tablet TAKE 1 TABLET BY MOUTH TWICE A DAY (Patient taking differently: Take 1 mg by mouth 2 (two) times daily.) 180 tablet 1   buPROPion (WELLBUTRIN XL) 150 MG 24 hr tablet Take 1 tablet (150 mg total) by mouth daily. 30 tablet 0   dexlansoprazole (DEXILANT) 60 MG capsule Take 60 mg by mouth daily.     Diclofenac Potassium,Migraine, (CAMBIA) 50 MG PACK Take 1 packet by mouth daily as needed for migraine.     dronabinol (MARINOL) 2.5 MG capsule Take 1 capsule (2.5 mg total) by mouth 2 (two) times daily as needed (nausea). 30 capsule 0   EPINEPHrine 0.3 mg/0.3 mL IJ SOAJ injection Inject 0.3 mg into the muscle as needed for anaphylaxis. 2 each 2   escitalopram (LEXAPRO) 20 MG tablet Take 1 tablet (20 mg total) by mouth every evening. 90 tablet 3   fluconazole (DIFLUCAN) 150 MG tablet TAKE 1 TABLET (150 MG TOTAL) BY MOUTH DAILY. IF NEEDED FOR YEAST INFECTION 5 tablet 0   fluticasone-salmeterol (ADVAIR HFA) 230-21 MCG/ACT inhaler Inhale 2 puffs into the lungs 2 (two) times daily. 1 each 5   Galcanezumab-gnlm 120 MG/ML SOAJ Inject 120  mg into the skin every 28 (twenty-eight) days.      heparin lock flush 100 UNIT/ML SOLN injection USE 5 MLS AS A HEPLOCK IN PORT A CATH ONCE DAILY AFTER MEDICATION ADMINISTRATION OR AS DIRECTED BY PHYSICIAN. 150 mL 1   hydrocortisone (CORTEF) 10 MG tablet Take 1-2 tablets (10-20 mg total) by mouth See admin instructions. Take 20 mg in the am and 51m in the evening (Patient taking differently: Take 10-20 mg by mouth See admin instructions. Take 20 mg by mouth in the morning and 146min the evening)     lamoTRIgine (LAMICTAL) 100 MG tablet Take one tablet at bedtime. 90 tablet 1   Lancets (ONETOUCH DELICA PLUS [id number] 3 (three)  times daily. for testing     metoprolol succinate (TOPROL-XL) 25 MG 24 hr tablet Take 37.5 mg by mouth daily.      ONETOUCH VERIO test strip 3 (three) times daily. for testing     OXYGEN Inhale 3-4 L into the lungs continuous.     predniSONE (DELTASONE) 10 MG tablet Take 4 tabs x 2 days, then 3 x 2d, then 2 x 2d, then 1 x 2d and STOP 20 tablet 0   Rimegepant Sulfate (NURTEC) 75 MG TBDP Take 75 mg by mouth daily as needed (Migraine).      rosuvastatin (CRESTOR) 10 MG tablet Take 10 mg by mouth daily.      sitaGLIPtin (JANUVIA) 100 MG tablet Take 1 tablet (100 mg total) by mouth daily.     Sodium Chloride Flush (NORMAL SALINE FLUSH) 0.9 % SOLN FLUSH WITH TWO 10 ML FLUSHES IN PORT BEFORE AND AFTER FLUIDS AND MEDICATION ADMINISTRATION, TO MAINTAIN PATENCY--USE UP TO 4 TIMES DAILY. 2400 mL 1   sucralfate (CARAFATE) 1 g tablet Take 1 g by mouth daily as needed (FOR ULCERS).      SYNTHROID 100 MCG tablet Take 1 tablet (100 mcg total) by mouth every morning.     topiramate (TOPAMAX) 50 MG tablet Take 150 mg by mouth at bedtime.      zolpidem (AMBIEN CR) 12.5 MG CR tablet Take 12.5 mg by mouth at bedtime.     albuterol (PROVENTIL) (2.5 MG/3ML) 0.083% nebulizer solution Take 3 mLs (2.5 mg total) by nebulization every 6 (six) hours as needed for wheezing or shortness of breath. 75 mL 12   potassium chloride (KLOR-CON) 10 MEQ tablet Take 1 tablet (10 mEq total) by mouth daily for 21 days. 21 tablet 0   Current Facility-Administered Medications  Medication Dose Route Frequency Provider Last Rate Last Admin   0.9 %  sodium chloride infusion   Intravenous PRN ElMargaretha SeedsMD       Mepolizumab SOLR 100 mg  100 mg Subcutaneous Q28 days ElMargaretha SeedsMD   100 mg at 08/31/19 1017    PHYSICAL EXAMINATION: ECOG PERFORMANCE STATUS: 1 - Symptomatic but completely ambulatory  Vitals:   05/29/22 1227  BP: 131/78  Pulse: 95  Resp: 18  Temp: 97.7 F (36.5 C)  SpO2: 100%   Filed Weights      LABORATORY DATA:  I have reviewed the data as listed    Latest Ref Rng & Units 04/07/2022    4:23 AM 04/06/2022    5:46 AM 04/05/2022    6:54 PM  CMP  Glucose 70 - 99 mg/dL 135  151  111   BUN 6 - 20 mg/dL 29  27  34   Creatinine 0.44 - 1.00 mg/dL 1.04  0.98  1.53   Sodium 135 - 145 mmol/L 139  137  137   Potassium 3.5 - 5.1 mmol/L 3.6  4.0  4.0   Chloride 98 - 111 mmol/L 107  112  106   CO2 22 - 32 mmol/L _0 Calcium 8.9 - 10.3 mg/dL 8.3  8.0  9.4   Total Protein 6.5 - 8.1 g/dL   8.3   Total Bilirubin 0.3 - 1.2 mg/dL   0.6   Alkaline Phos 38 - 126 U/L   86   AST 15 - 41 U/L   29   ALT 0 - 44 U/L   43     Lab Results  Component Value Date   WBC 9.3 04/07/2022   HGB 9.0 (L) 04/07/2022   HCT 30.2 (L) 04/07/2022   MCV 79.1 (L) 04/07/2022   PLT 308 04/07/2022   NEUTROABS 12.8 (H) 04/05/2022    ASSESSMENT & PLAN:  Hx of Hodgkins lymphoma History of DCIS, thyroid cancer, cervical cancer, Hodgkin's lymphoma: In remission Lymphedema: Currently on Bumex. Severe depression: Patient seeing psychologist and psychiatrist at [facility] psychiatry.  She is currently also on Wellbutrin.  Hospitalization 04/04/2022-04/09/2022 Enterobacter bacteremia patient follows with Dr. Evelina Bucy for infectious disease needs.  Lack of IV access: Previously the port was removed because of bacteremia.  She is very stressed out and was c tearful and crying. I will discuss with infectious disease about what their suggestions are for IV access.    No orders of the defined types were placed in this encounter.  The patient has a good understanding of the overall plan. she agrees with it. she will call with any problems that may develop before the next visit here. Total time spent: 30 mins including face to face time and time spent for planning, charting and co-ordination of care   Harriette Ohara, MD 05/29/22    I Gardiner Coins am scribing for Dr. Lindi Adie  I have reviewed the above  documentation for accuracy and completeness, and I agree with the above.

## 2022-05-29 NOTE — Assessment & Plan Note (Signed)
History of DCIS, thyroid cancer, cervical cancer, Hodgkin's lymphoma: In remission Lymphedema: Currently on Bumex. Severe depression: Patient seeing psychologist and psychiatrist at [facility] psychiatry. She is currently also on Wellbutrin.  Hospitalization 04/04/2022-04/09/2022 Enterobacter bacteremia patient follows with Dr. Evelina Bucy for infectious disease needs.  Return to clinic in 1 year

## 2022-05-30 ENCOUNTER — Encounter: Payer: Self-pay | Admitting: Hematology and Oncology

## 2022-05-31 ENCOUNTER — Ambulatory Visit (INDEPENDENT_AMBULATORY_CARE_PROVIDER_SITE_OTHER): Payer: BC Managed Care – PPO | Admitting: Internal Medicine

## 2022-05-31 ENCOUNTER — Other Ambulatory Visit: Payer: Self-pay

## 2022-05-31 VITALS — BP 133/86 | HR 99 | Temp 97.9°F

## 2022-05-31 DIAGNOSIS — R7881 Bacteremia: Secondary | ICD-10-CM

## 2022-05-31 DIAGNOSIS — Z9889 Other specified postprocedural states: Secondary | ICD-10-CM

## 2022-05-31 NOTE — Progress Notes (Signed)
RFV: hospital follow up Patient ID: [name], female   DOB: Jun 01, 1971, 51 y.o.   MRN: [id number]  HPI Hx of central line associated bacteremia. 2 episodes this year.  Dr Debbora Presto - endocrinologist at [facility].  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   Trouble is that she has a pcp in [place] (dr Stanton Kidney toppin) once a year but all her other providers are here in [place].   Dr. Chalmers Cater (endo)-saw her last week and doing well with DM management and addison's disease. Hgba1c 5.1.   Sees dr Delsa Sale [facility], neurology for follow-up of functional neurologic disorder. She was hospitalized in May for weakness again and had numbness on the left side. Has had prior episodes of bilateral leg weakness.   Outpatient Encounter Medications as of 05/31/2022  Medication Sig   albuterol (PROVENTIL) (2.5 MG/3ML) 0.083% nebulizer solution Take 3 mLs (2.5 mg total) by nebulization every 6 (six) hours as needed for wheezing or shortness of breath.   albuterol (VENTOLIN HFA) 108 (90 Base) MCG/ACT inhaler Inhale 2 puffs into the lungs every 6 (six) hours as needed for wheezing or shortness of breath.   ALPRAZolam (XANAX) 0.5 MG tablet Take 1 tablet (0.5 mg total) by mouth 2 (two) times daily as needed for anxiety.   ALPRAZolam (XANAX) 1 MG tablet Take 1 mg by mouth 2 (two) times daily as needed for anxiety.   aspirin 325 MG tablet Take 325 mg by mouth at bedtime.    bumetanide (BUMEX) 1 MG tablet TAKE 1 TABLET BY MOUTH TWICE A DAY (Patient taking differently: Take 1 mg by mouth 2 (two) times daily.)   buPROPion (WELLBUTRIN XL) 150 MG 24 hr tablet TAKE 1 TABLET BY MOUTH EVERY DAY   dexlansoprazole (DEXILANT) 60 MG capsule Take 60 mg by mouth daily.   Diclofenac Potassium,Migraine, (CAMBIA) 50 MG PACK Take 1 packet by mouth daily as needed for migraine.   dronabinol (MARINOL) 2.5 MG capsule Take 1 capsule (2.5 mg total) by mouth 2 (two) times daily as needed (nausea).   EPINEPHrine  0.3 mg/0.3 mL IJ SOAJ injection Inject 0.3 mg into the muscle as needed for anaphylaxis.   escitalopram (LEXAPRO) 20 MG tablet Take 1 tablet (20 mg total) by mouth every evening.   fluconazole (DIFLUCAN) 150 MG tablet TAKE 1 TABLET (150 MG TOTAL) BY MOUTH DAILY. IF NEEDED FOR YEAST INFECTION   fluticasone-salmeterol (ADVAIR HFA) 230-21 MCG/ACT inhaler Inhale 2 puffs into the lungs 2 (two) times daily.   Galcanezumab-gnlm 120 MG/ML SOAJ Inject 120 mg into the skin every 28 (twenty-eight) days.    heparin lock flush 100 UNIT/ML SOLN injection USE 5 MLS AS A HEPLOCK IN PORT A CATH ONCE DAILY AFTER MEDICATION ADMINISTRATION OR AS DIRECTED BY PHYSICIAN.   hydrocortisone (CORTEF) 10 MG tablet Take 1-2 tablets (10-20 mg total) by mouth See admin instructions. Take 20 mg in the am and 64m in the evening (Patient taking differently: Take 10-20 mg by mouth See admin instructions. Take 20 mg by mouth in the morning and 179min the evening)   lamoTRIgine (LAMICTAL) 100 MG tablet Take one tablet at bedtime.   Lancets (ONETOUCH DELICA PLUS [id number] 3 (three) times daily. for testing   metoprolol succinate (TOPROL-XL) 25 MG 24 hr tablet Take 37.5 mg by mouth daily.    ONETOUCH VERIO test strip 3 (three) times daily. for testing   OXYGEN Inhale 3-4 L into the lungs continuous.   potassium  chloride (KLOR-CON) 10 MEQ tablet Take 1 tablet (10 mEq total) by mouth daily for 21 days.   predniSONE (DELTASONE) 10 MG tablet Take 4 tabs x 2 days, then 3 x 2d, then 2 x 2d, then 1 x 2d and STOP   Rimegepant Sulfate (NURTEC) 75 MG TBDP Take 75 mg by mouth daily as needed (Migraine).    rosuvastatin (CRESTOR) 10 MG tablet Take 10 mg by mouth daily.    sitaGLIPtin (JANUVIA) 100 MG tablet Take 1 tablet (100 mg total) by mouth daily.   Sodium Chloride Flush (NORMAL SALINE FLUSH) 0.9 % SOLN FLUSH WITH TWO 10 ML FLUSHES IN PORT BEFORE AND AFTER FLUIDS AND MEDICATION ADMINISTRATION, TO MAINTAIN PATENCY--USE UP TO 4 TIMES  DAILY.   sucralfate (CARAFATE) 1 g tablet Take 1 g by mouth daily as needed (FOR ULCERS).    SYNTHROID 100 MCG tablet Take 1 tablet (100 mcg total) by mouth every morning.   topiramate (TOPAMAX) 50 MG tablet Take 150 mg by mouth at bedtime.    zolpidem (AMBIEN CR) 12.5 MG CR tablet Take 12.5 mg by mouth at bedtime.   Facility-Administered Encounter Medications as of 05/31/2022  Medication   0.9 %  sodium chloride infusion   Mepolizumab SOLR 100 mg     Patient Active Problem List   Diagnosis Date Noted   Anxiety and depression 04/06/2022   GERD without esophagitis 04/06/2022   Sepsis ([place]) 04/06/2022   PICC (peripherally inserted central catheter) in place    Bacteremia due to Enterobacter species 03/29/2022   Normocytic anemia 03/29/2022   Major depressive disorder, recurrent episode, moderate (HCC)    Generalized anxiety disorder    Seizure-like activity ([place]) 12/08/2020   High anion gap metabolic acidosis 19/50/9326   Hypokalemia 12/08/2020   AKI (acute kidney injury) ([place]) 04/27/2020   Vasculopathy 04/27/2020   Bacteremia 04/19/2020   Addison's disease ([place])    Nausea 04/01/2020   Generalized weakness 02/10/2020   Headache 02/10/2020   Subcutaneous nodule 02/01/2020   Port-A-Cath in place 09/09/2019   Moderate persistent asthma without complication 71/24/5809   Fever 07/21/2019   Superficial thrombophlebitis 07/16/2019   Injury of left index finger 12/24/2018   History of pituitary adenoma 12/23/2018   History of cervical cancer 12/23/2018   Chronic respiratory failure with hypoxia ([place]) 12/23/2018   SOB (shortness of breath)    Nephrolithiasis 11/07/2018   Oxygen dependent 11/07/2018   Migraine 11/07/2018   PVC's (premature ventricular contractions) 10/27/2018   Hyperlipidemia 03/09/2014   Hx of Hodgkins lymphoma    History of ductal carcinoma in situ (DCIS) of breast    History of thyroid cancer    Adrenal insufficiency (HCC)    Hypertension    Raynaud  phenomenon    GERD (gastroesophageal reflux disease)      Health Maintenance Due  Topic Date Due   URINE MICROALBUMIN  Never done   Zoster Vaccines- Shingrix (1 of 2) Never done   PAP SMEAR-Modifier  Never done   COLONOSCOPY (Pts 45-64yr Insurance coverage will need to be confirmed)  Never done   COVID-19 Vaccine (3 - Moderna risk series) 02/09/2020   TETANUS/TDAP  12/18/2020   MAMMOGRAM  Never done     Review of Systems Hand swelling from recent  Still has residual bruising to her hand Physical Exam   BP 133/86   Pulse 99   Temp 97.9 F (36.6 C) (Oral)   LMP  (LMP Unknown)   SpO2 98% Comment: 3 or 4L O2  [place] per patient  Gen = tearful  CBC Lab Results  Component Value Date   WBC 9.3 04/07/2022   RBC 3.82 (L) 04/07/2022   HGB 9.0 (L) 04/07/2022   HCT 30.2 (L) 04/07/2022   PLT 308 04/07/2022   MCV 79.1 (L) 04/07/2022   MCH 23.6 (L) 04/07/2022   MCHC 29.8 (L) 04/07/2022   RDW 17.6 (H) 04/07/2022   LYMPHSABS 1.2 04/05/2022   MONOABS 1.0 04/05/2022   EOSABS 0.0 04/05/2022    BMET Lab Results  Component Value Date   NA 139 04/07/2022   K 3.6 04/07/2022   CL 107 04/07/2022   CO2 23 04/07/2022   GLUCOSE 135 (H) 04/07/2022   BUN 29 (H) 04/07/2022   CREATININE 1.04 (H) 04/07/2022   CALCIUM 8.3 (L) 04/07/2022   GFRNONAA >60 04/07/2022   GFRAA >60 04/27/2020      Assessment and Plan   Will discuss with dr balan (endocrine _0 ) if can get pcp through their multi-disciplinary team. And who to manage central line.  - from ID standpoint, she has lower risk without an indwelling line. However, she has very limited venous access. It would make sense that if she were to require frequent labs or need for iv fluids, and injections that it should be prescribed by the physician working closest with her. It does not appear to be her PCP, nor oncology and ID only sees the sequelae of line infections with her (2-3 x this past year, but the prior year did not have issues. She  has hx of having indwelling port since 2019, (has had changes intermittently due to line infections) - she has numerous life stressors, and health problems for which she is very concerned/stressed to be without central access. She doesn't appear to be using illicit drugs or abusing line. She is willing to be drug tested. She does have history of migraines, thus has needed iv benadryl in the past (Frequency unknown )per chart review.   Spent 45 min with patient discussing next plans for vascular access

## 2022-06-03 ENCOUNTER — Other Ambulatory Visit: Payer: Self-pay | Admitting: Adult Health

## 2022-06-03 DIAGNOSIS — F331 Major depressive disorder, recurrent, moderate: Secondary | ICD-10-CM

## 2022-06-08 ENCOUNTER — Telehealth (INDEPENDENT_AMBULATORY_CARE_PROVIDER_SITE_OTHER): Payer: Self-pay | Admitting: Adult Health

## 2022-06-08 DIAGNOSIS — F489 Nonpsychotic mental disorder, unspecified: Secondary | ICD-10-CM

## 2022-06-08 NOTE — Progress Notes (Signed)
Patient no show video appointment. Called patient N/A - LVM to R/S appointment.

## 2022-06-14 ENCOUNTER — Inpatient Hospital Stay: Payer: BC Managed Care – PPO | Attending: Hematology and Oncology

## 2022-06-14 ENCOUNTER — Encounter: Payer: Self-pay | Admitting: Internal Medicine

## 2022-06-14 ENCOUNTER — Ambulatory Visit (INDEPENDENT_AMBULATORY_CARE_PROVIDER_SITE_OTHER): Payer: BC Managed Care – PPO | Admitting: Internal Medicine

## 2022-06-14 VITALS — Resp 16 | Ht 65.0 in

## 2022-06-14 DIAGNOSIS — Z0000 Encounter for general adult medical examination without abnormal findings: Secondary | ICD-10-CM

## 2022-06-14 DIAGNOSIS — Z9889 Other specified postprocedural states: Secondary | ICD-10-CM | POA: Diagnosis not present

## 2022-06-14 DIAGNOSIS — I890 Lymphedema, not elsewhere classified: Secondary | ICD-10-CM | POA: Diagnosis not present

## 2022-06-14 NOTE — Progress Notes (Signed)
Virtual Visit via Telephone Note  I connected with [name] on 06/14/22 at  1:45 PM EDT by telephone and verified that I am speaking with the correct person using two identifiers.  Location: Patient: at home Provider: at clinic   I discussed the limitations, risks, security and privacy concerns of performing an evaluation and management service by telephone and the availability of in person appointments. I also discussed with the patient that there may be a patient responsible charge related to this service. The patient expressed understanding and agreed to proceed.   History of Present Illness:  [name] is a 51 year old female with hx of multiple cancers including: thyroid cancer, cervical cancer, melanoma, hogkins lymphoma, breast ductal carcinoma, she is now in remission and also has limited venous access due to chronic lymphedema in upper extremities. She sees endocrine for addison's disease and follows closely with mental health providers for anxiety/depression. She also is has right heart failure and follows up with cardiology. With having a portacath in place, she has had recurrent bacteremia/sepsis episodes this past year.  She is now without a central line/port however she is concern what to do if she has adrenal crisis or what to do when she needs to have labs drawn. She had numerous venopuncture attempts roughly 1-2 wk that are still poorly healing  She is also noticing increasing swelling to hands and feet over the last 2 weeks for which she wears compression stockings. She is worried about the slow healing of venopuncture sites. She is showing pictures of the concerned areas of hands, left AC, and swelling of feet - the pictures in epic do not suggest that she has cellulitis, no purulent drainage. She is using topical silversilvadine ointment.  She has burning sensation bilateral to hands due to swelling. She is teafull during this visit and has turned the phone to her  husband  He mentioned they saw dr balan yesterday and they decided not to give prolia due to concern of poor healing of wounds. They did not address that she needs a local pcp. Her current pcp she sees once a year in [place]. All other providers are local. He mentioned that they tried to find pcp in the past but felt her care was too complicated.   Her providers: PCP- toppin ([facility]) Psychiatry/mental health Pulm- [facility]- [facility]- [facility]- [facility] -   Observations/Objective: Reviewed 5 photos are areas of concern.   Assessment and Plan: Possible early cellulitis = recommend local wound care. Can do short trial of bactrim ds 1 bid x 3-5 days for [facility] lesion, she mentions that she has abtx at home.  Chronic lymphedema and increased edema = continue with compression stockings. She did not respond to additional dose of bumex that we instructed last week. Appear stable in terms on not worsening significantly. Recommend to discuss with cardiology or pcp to whether to increase diuretics  Limited venous access= decision for tami to have another port place comes with some risks. She has had 2 different episodes of bacteremia in the past 8 months, however did not have any the prior year. I think she will eventually need venous access if she is to require further labs; however, I think the decision for how it is maintained should come from the provider she sees most/PCP. We have seen her mostly for infections rather than be the ones to manage her central line/portacath.  Follow Up Instructions:   do short trial of bactrim ds bid  x 3-5 days/ if worsening erythema or fever or chills, need to be seen in the ED. Have her reach out to her PCP to evaluate chronic lymphedema, and central access issue.   We will refer to local primary care doctor but anticipate that will take weeks to establish  To return as needed.  I discussed the assessment and treatment plan with the patient. The  patient was provided an opportunity to ask questions and all were answered. The patient agreed with the plan and demonstrated an understanding of the instructions.   The patient was advised to call back or seek an in-person evaluation if the symptoms worsen or if the condition fails to improve as anticipated.  I provided 65 minutes of non-face-to-face time during this encounter.   Carlyle Basques, MD

## 2022-06-18 DIAGNOSIS — E271 Primary adrenocortical insufficiency: Secondary | ICD-10-CM | POA: Diagnosis not present

## 2022-06-18 DIAGNOSIS — C800 Disseminated malignant neoplasm, unspecified: Secondary | ICD-10-CM | POA: Diagnosis not present

## 2022-06-18 DIAGNOSIS — I890 Lymphedema, not elsewhere classified: Secondary | ICD-10-CM | POA: Diagnosis not present

## 2022-06-18 DIAGNOSIS — A499 Bacterial infection, unspecified: Secondary | ICD-10-CM | POA: Diagnosis not present

## 2022-06-19 ENCOUNTER — Ambulatory Visit (INDEPENDENT_AMBULATORY_CARE_PROVIDER_SITE_OTHER): Payer: BC Managed Care – PPO | Admitting: Adult Health

## 2022-06-19 ENCOUNTER — Encounter: Payer: Self-pay | Admitting: Adult Health

## 2022-06-19 DIAGNOSIS — F411 Generalized anxiety disorder: Secondary | ICD-10-CM | POA: Diagnosis not present

## 2022-06-19 DIAGNOSIS — F410 Panic disorder [episodic paroxysmal anxiety] without agoraphobia: Secondary | ICD-10-CM | POA: Diagnosis not present

## 2022-06-19 DIAGNOSIS — F331 Major depressive disorder, recurrent, moderate: Secondary | ICD-10-CM | POA: Diagnosis not present

## 2022-06-19 DIAGNOSIS — G4700 Insomnia, unspecified: Secondary | ICD-10-CM | POA: Diagnosis not present

## 2022-06-19 MED ORDER — LAMOTRIGINE 150 MG PO TABS: ORAL_TABLET | ORAL | 1 refills | Status: DC

## 2022-06-19 MED ORDER — CLONAZEPAM 0.5 MG PO TABS: 0.5000 mg | ORAL_TABLET | Freq: Two times a day (BID) | ORAL | 2 refills | Status: DC | PRN

## 2022-06-19 NOTE — Progress Notes (Signed)
[name] [id number] 28-Sep-1971 51 y.o.  Subjective:   Patient ID:  [name] is a 51 y.o. (DOB [birth year]) female.  Chief Complaint: No chief complaint on file.   HPI [name] presents to the office today for follow-up of MDD, GAD, insomnia, and panic attacks.  Describes mood today as "not good". Pleasant. Tearful throughout interview. Mood symptoms - reports depression, anxiety, and irritability. Reports panic attacks. Decreased worry and rumination - "I just don't care". Reports increased frustration with ongoing health issues. Stating "I'm not doing well". Has been working with Rinaldo Cloud for therapy - hoping to increase visits. She and husband getting along better. Did not tolerate the increase in Wellbutrin from 14m to 3028m- "made me sick on my stomach". Was able to tolerate the increase in Lamictal to 10014mnd is willing to increase dose to 150m58mould also like to switch the Xanax - no longer helpful - has taken Clonazepam in the past ad would like to try it instead of the Xanax. 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 A39so treated for multiple medical issues. Father also with prostate cancer - recent surgery. Varying interest and motivation. Taking medications as prescribed. Started seeing [facility] therapy.  Energy levels poor. Active, does not have a regular exercise routine with current physical disabilities. Enjoys some usual interests and activities. Married. Lives with husband - 2 children. Spending time with family.  Appetite decreased. Weight stable 177 pounds. Sleeps better some nights than others - some here and there - using Ambien.  Focus and concentration difficulties. Completing tasks. Managing aspects of household. Disabled in 2016 - [employer] [place] or [place].  Denies AH or VH.  Previous medication trials: Wellbutrin - mean, Zoloft-not helpful    PHQ2-9    Flowsheet Row [facility]  PHQ-2 Total Score _0 PHQ-9 Total Score -- _1 Flowsheet Row ED to Hosp-Admission (Discharged) [facility]  C-SSRS RISK CATEGORY No Risk No Risk No Risk        Review of Systems:  Review of Systems  Musculoskeletal:  Negative for gait problem.  Neurological:  Negative for tremors.  Psychiatric/Behavioral:         Please refer to HPI    Medications: I have reviewed the patient's current medications.  Current Outpatient Medications  Medication Sig Dispense Refill   clonazePAM (KLONOPIN) 0.5 MG tablet Take 1 tablet (0.5 mg total) by mouth 2 (two) times daily as needed for anxiety. 60 tablet 2   albuterol (PROVENTIL) (2.5 MG/3ML) 0.083% nebulizer solution Take 3 mLs (2.5 mg total) by nebulization every 6 (six) hours as needed for wheezing or shortness of breath. 75 mL 12   albuterol (VENTOLIN HFA) 108 (90 Base) MCG/ACT inhaler Inhale 2 puffs into the lungs every 6 (six) hours as needed for wheezing or shortness of breath.     aspirin 325 MG tablet Take 325 mg by mouth at bedtime.      bumetanide (BUMEX) 1  MG tablet TAKE 1 TABLET BY MOUTH TWICE A DAY (Patient taking differently: Take 1 mg by mouth 2 (two) times daily.) 180 tablet 1   buPROPion (WELLBUTRIN XL) 150 MG 24 hr tablet TAKE 1 TABLET BY MOUTH EVERY DAY 90 tablet 0   dexlansoprazole (DEXILANT) 60 MG capsule Take 60 mg by mouth daily.     Diclofenac Potassium,Migraine, (CAMBIA) 50 MG PACK Take 1 packet by mouth daily as needed for migraine.     dronabinol  (MARINOL) 2.5 MG capsule Take 1 capsule (2.5 mg total) by mouth 2 (two) times daily as needed (nausea). 30 capsule 0   EPINEPHrine 0.3 mg/0.3 mL IJ SOAJ injection Inject 0.3 mg into the muscle as needed for anaphylaxis. 2 each 2   escitalopram (LEXAPRO) 20 MG tablet Take 1 tablet (20 mg total) by mouth every evening. 90 tablet 3   fluconazole (DIFLUCAN) 150 MG tablet TAKE 1 TABLET (150 MG TOTAL) BY MOUTH DAILY. IF NEEDED FOR YEAST INFECTION 5 tablet 0   fluticasone-salmeterol (ADVAIR HFA) 230-21 MCG/ACT inhaler Inhale 2 puffs into the lungs 2 (two) times daily. 1 each 5   Galcanezumab-gnlm 120 MG/ML SOAJ Inject 120 mg into the skin every 28 (twenty-eight) days.      heparin lock flush 100 UNIT/ML SOLN injection USE 5 MLS AS A HEPLOCK IN PORT A CATH ONCE DAILY AFTER MEDICATION ADMINISTRATION OR AS DIRECTED BY PHYSICIAN. 150 mL 1   hydrocortisone (CORTEF) 10 MG tablet Take 1-2 tablets (10-20 mg total) by mouth See admin instructions. Take 20 mg in the am and 2m in the evening (Patient taking differently: Take 10-20 mg by mouth See admin instructions. Take 20 mg by mouth in the morning and 18min the evening)     lamoTRIgine (LAMICTAL) 150 MG tablet Take one tablet at bedtime. 90 tablet 1   Lancets (ONETOUCH DELICA PLUS [id number] 3 (three) times daily. for testing     metoprolol succinate (TOPROL-XL) 25 MG 24 hr tablet Take 37.5 mg by mouth daily.      ONETOUCH VERIO test strip 3 (three) times daily. for testing     OXYGEN Inhale 3-4 L into the lungs continuous.     potassium chloride (KLOR-CON) 10 MEQ tablet Take 1 tablet (10 mEq total) by mouth daily for 21 days. 21 tablet 0   predniSONE (DELTASONE) 10 MG tablet Take 4 tabs x 2 days, then 3 x 2d, then 2 x 2d, then 1 x 2d and STOP 20 tablet 0   Rimegepant Sulfate (NURTEC) 75 MG TBDP Take 75 mg by mouth daily as needed (Migraine).      rosuvastatin (CRESTOR) 10 MG tablet Take 10 mg by mouth daily.      sitaGLIPtin (JANUVIA) 100 MG tablet Take  1 tablet (100 mg total) by mouth daily.     Sodium Chloride Flush (NORMAL SALINE FLUSH) 0.9 % SOLN FLUSH WITH TWO 10 ML FLUSHES IN PORT BEFORE AND AFTER FLUIDS AND MEDICATION ADMINISTRATION, TO MAINTAIN PATENCY--USE UP TO 4 TIMES DAILY. 2400 mL 1   sucralfate (CARAFATE) 1 g tablet Take 1 g by mouth daily as needed (FOR ULCERS).      SYNTHROID 100 MCG tablet Take 1 tablet (100 mcg total) by mouth every morning.     topiramate (TOPAMAX) 50 MG tablet Take 150 mg by mouth at bedtime.      zolpidem (AMBIEN CR) 12.5 MG CR tablet Take 12.5 mg by mouth at bedtime.     Current Facility-Administered Medications  Medication  Dose Route Frequency Provider Last Rate Last Admin   0.9 %  sodium chloride infusion   Intravenous PRN Margaretha Seeds, MD       Mepolizumab SOLR 100 mg  100 mg Subcutaneous Q28 days Margaretha Seeds, MD   100 mg at 08/31/19 1017    Medication Side Effects: None  Allergies:  Allergies  Allergen Reactions   Ferrous Bisglycinate Chelate [Iron] Anaphylaxis    Only IV    Mushroom Extract Complex Anaphylaxis   Na Ferric Gluc Cplx In Sucrose Anaphylaxis   Cymbalta [Duloxetine Hcl] Swelling and Anxiety   Ondansetron Hcl Other (See Comments)   Promethazine Other (See Comments)    Other reaction(s): Unknown   Promethazine Hcl     Other reaction(s): Unknown   Succinylcholine Other (See Comments)    Lock Jaw   Buprenorphine Hcl Hives   Compazine Other (See Comments)    Altered mental status Aggression   Metoclopramide Other (See Comments)    Dystonia   Morphine And Related Hives   Ondansetron Hives and Rash    Other reaction(s): Unknown Other reaction(s): Unknown   Promethazine Hcl Hives   Tegaderm Ag Mesh [Silver] Rash    Old formulation only, is able to tolerate new formulation    Past Medical History:  Diagnosis Date   Addison's disease ([place])    Adrenal insufficiency (HCC)    Anemia    Anxiety    Aortic stenosis    Aortic stenosis    Appendicitis 12/19/09    Appendicitis    Breast cancer (HCC)    STATUS POST BILATERAL MASTECTOMY. STATUS POST RECONSTRUCTION. 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   Chronic respiratory failure with hypoxia (HCC) 12/23/2018   Cough variant asthma 04/13/2019   Depression    GERD (gastroesophageal reflux disease)    takes Dexilant and carafate and gi coctail    Headache    migraines on a daily and monthly regimen    Heart murmur    History of kidney stones    Hodgkin lymphoma ([place])    STATUS POST MANTLE RADIATION   Hodgkin's lymphoma ([place])    1987   Hypertension    Hypoxia    Necrotizing fasciitis ([place]) 12/23/2018   Non-ischemic cardiomyopathy ([place])    Osteoporosis    Palpitations    Pituitary adenoma ([place]) 12/23/2018   Pneumonia    PONV (postoperative nausea and vomiting)    Pre-diabetes    per pt; no meds   Pulmonary hypertension ([place]) 12/23/2018   Raynaud phenomenon    Right heart failure ([place]) 04/17/2017   Seizures ([place])    last febrile seizure was approx 3 weeks ago per report on 12/01/2020   Sleep apnea    upcoming sleep study per pt    Supplemental oxygen dependent    3 liters   SVT (supraventricular tachycardia) (HCC)    Tachycardia    Thyroid cancer ([place])    STATUS POST SURGICAL REMOVAL-CURRENT ON THYROID REPLACEMENT    Past Medical History, Surgical history, Social history, and Family history were reviewed and updated as appropriate.   Please see review of systems for further details on the patient's review from today.   Objective:   Physical Exam:  LMP  (LMP Unknown)   Physical Exam Constitutional:  General: She is not in acute distress. Musculoskeletal:        General: No deformity.  Neurological:     Mental Status: She is alert and oriented to person, place, and time.     Coordination: Coordination  normal.  Psychiatric:        Attention and Perception: Attention and perception normal. She does not perceive auditory or visual hallucinations.        Mood and Affect: Mood normal. Mood is not anxious or depressed. Affect is not labile, blunt, angry or inappropriate.        Speech: Speech normal.        Behavior: Behavior normal.        Thought Content: Thought content normal. Thought content is not paranoid or delusional. Thought content does not include homicidal or suicidal ideation. Thought content does not include homicidal or suicidal plan.        Cognition and Memory: Cognition and memory normal.        Judgment: Judgment normal.     Comments: Insight intact     Lab Review:     Component Value Date/Time   NA 139 04/07/2022 0423   NA 141 04/01/2018 1617   K 3.6 04/07/2022 0423   CL 107 04/07/2022 0423   CO2 23 04/07/2022 0423   GLUCOSE 135 (H) 04/07/2022 0423   BUN 29 (H) 04/07/2022 0423   BUN 11 04/01/2018 1617   CREATININE 1.04 (H) 04/07/2022 0423   CREATININE 1.08 (H) 02/09/2022 1202   CALCIUM 8.3 (L) 04/07/2022 0423   PROT 8.3 (H) 04/05/2022 1854   PROT 7.1 01/23/2018 1225   ALBUMIN 4.0 04/05/2022 1854   ALBUMIN 4.4 01/23/2018 1225   AST 29 04/05/2022 1854   AST 26 02/09/2022 1202   ALT 43 04/05/2022 1854   ALT 40 02/09/2022 1202   ALKPHOS 86 04/05/2022 1854   BILITOT 0.6 04/05/2022 1854   BILITOT 0.4 02/09/2022 1202   GFRNONAA >60 04/07/2022 0423   GFRNONAA >60 02/09/2022 1202   GFRAA >60 04/27/2020 0613   GFRAA >60 02/10/2020 1020       Component Value Date/Time   WBC 9.3 04/07/2022 0423   RBC 3.82 (L) 04/07/2022 0423   HGB 9.0 (L) 04/07/2022 0423   HGB 10.4 (L) 02/09/2022 1202   HGB 12.0 12/21/2019 1418   HCT 30.2 (L) 04/07/2022 0423   HCT 39.9 04/01/2018 1617   PLT 308 04/07/2022 0423   PLT 357 02/09/2022 1202   PLT 327 04/01/2018 1617   MCV 79.1 (L) 04/07/2022 0423   MCV 89 04/01/2018 1617   MCH 23.6 (L) 04/07/2022 0423   MCHC 29.8 (L)  04/07/2022 0423   RDW 17.6 (H) 04/07/2022 0423   RDW 13.6 04/01/2018 1617   LYMPHSABS 1.2 04/05/2022 1854   MONOABS 1.0 04/05/2022 1854   EOSABS 0.0 04/05/2022 1854   BASOSABS 0.1 04/05/2022 1854    No results found for: "POCLITH", "LITHIUM"   No results found for: "PHENYTOIN", "PHENOBARB", "VALPROATE", "CBMZ"   .res Assessment: Plan:     Plan:  PDMP reviewed  Lexapro 22m daily D/C Xanax 0.569mBID Add Clonazepam 0.22m69mID  Wellbutrin XL 150m30mery morning - denies seizure history Increase Lamictal 100mg56m150mg 63my  Plans to increase therapy visits.  Discussed potential benefits, risk, and side effects of benzodiazepines to include potential risk of tolerance and dependence, as well as possible drowsiness.  Advised patient not to drive if experiencing drowsiness and to take lowest possible effective dose  to minimize risk of dependence and tolerance.   Counseled patient regarding potential benefits, risks, and side effects of Lamictal to include potential risk of Stevens-Johnson syndrome. Advised patient to stop taking Lamictal and contact office immediately if rash develops and to seek urgent medical attention if rash is severe and/or spreading quickly. Set up with therapist  RTC 4 weeks  Patient advised to contact office with any questions, adverse effects, or acute worsening in signs and symptoms.  Diagnoses and all orders for this visit:  Generalized anxiety disorder -     clonazePAM (KLONOPIN) 0.5 MG tablet; Take 1 tablet (0.5 mg total) by mouth 2 (two) times daily as needed for anxiety.  Major depressive disorder, recurrent episode, moderate (HCC) -     lamoTRIgine (LAMICTAL) 150 MG tablet; Take one tablet at bedtime.  Panic attacks  Insomnia, unspecified type     Please see After Visit Summary for patient specific instructions.  Future Appointments  Date Time Provider [place]  06/26/2022 12:00 PM Shanon Ace, LCSW CP-CP None  07/05/2022  11:00 AM Shanon Ace, LCSW CP-CP None  07/11/2022 12:00 PM Shanon Ace, LCSW CP-CP None  07/18/2022 12:00 PM Shanon Ace, LCSW CP-CP None  09/18/2022 10:00 AM Merryn Thaker, Berdie Ogren, NP CP-CP None    No orders of the defined types were placed in this encounter.   -------------------------------

## 2022-06-26 ENCOUNTER — Ambulatory Visit: Payer: Medicare Other | Admitting: Psychiatry

## 2022-06-28 ENCOUNTER — Encounter: Payer: Self-pay | Admitting: Hematology and Oncology

## 2022-06-28 ENCOUNTER — Encounter: Payer: Self-pay | Admitting: Internal Medicine

## 2022-07-01 ENCOUNTER — Emergency Department (HOSPITAL_COMMUNITY): Payer: BC Managed Care – PPO

## 2022-07-01 ENCOUNTER — Telehealth: Payer: Self-pay | Admitting: Pulmonary Disease

## 2022-07-01 ENCOUNTER — Inpatient Hospital Stay (HOSPITAL_COMMUNITY)
Admission: EM | Admit: 2022-07-01 | Discharge: 2022-07-09 | DRG: 871 | Disposition: A | Discharge status: Home / Self Care | Payer: BC Managed Care – PPO | Attending: Internal Medicine | Admitting: Internal Medicine

## 2022-07-01 ENCOUNTER — Encounter (HOSPITAL_COMMUNITY): Payer: Self-pay

## 2022-07-01 DIAGNOSIS — R569 Unspecified convulsions: Secondary | ICD-10-CM | POA: Diagnosis not present

## 2022-07-01 DIAGNOSIS — E785 Hyperlipidemia, unspecified: Secondary | ICD-10-CM | POA: Diagnosis present

## 2022-07-01 DIAGNOSIS — E890 Postprocedural hypothyroidism: Secondary | ICD-10-CM | POA: Diagnosis present

## 2022-07-01 DIAGNOSIS — I5082 Biventricular heart failure: Secondary | ICD-10-CM | POA: Diagnosis not present

## 2022-07-01 DIAGNOSIS — I776 Arteritis, unspecified: Secondary | ICD-10-CM | POA: Diagnosis present

## 2022-07-01 DIAGNOSIS — I2729 Other secondary pulmonary hypertension: Secondary | ICD-10-CM | POA: Diagnosis present

## 2022-07-01 DIAGNOSIS — F329 Major depressive disorder, single episode, unspecified: Secondary | ICD-10-CM | POA: Diagnosis present

## 2022-07-01 DIAGNOSIS — Z885 Allergy status to narcotic agent status: Secondary | ICD-10-CM

## 2022-07-01 DIAGNOSIS — M810 Age-related osteoporosis without current pathological fracture: Secondary | ICD-10-CM | POA: Diagnosis present

## 2022-07-01 DIAGNOSIS — R6520 Severe sepsis without septic shock: Secondary | ICD-10-CM | POA: Diagnosis not present

## 2022-07-01 DIAGNOSIS — Z8571 Personal history of Hodgkin lymphoma: Secondary | ICD-10-CM

## 2022-07-01 DIAGNOSIS — D849 Immunodeficiency, unspecified: Secondary | ICD-10-CM | POA: Diagnosis not present

## 2022-07-01 DIAGNOSIS — Z7984 Long term (current) use of oral hypoglycemic drugs: Secondary | ICD-10-CM

## 2022-07-01 DIAGNOSIS — J4551 Severe persistent asthma with (acute) exacerbation: Secondary | ICD-10-CM

## 2022-07-01 DIAGNOSIS — E2740 Unspecified adrenocortical insufficiency: Secondary | ICD-10-CM | POA: Diagnosis not present

## 2022-07-01 DIAGNOSIS — Z85820 Personal history of malignant melanoma of skin: Secondary | ICD-10-CM

## 2022-07-01 DIAGNOSIS — K2100 Gastro-esophageal reflux disease with esophagitis, without bleeding: Secondary | ICD-10-CM | POA: Diagnosis not present

## 2022-07-01 DIAGNOSIS — Z853 Personal history of malignant neoplasm of breast: Secondary | ICD-10-CM

## 2022-07-01 DIAGNOSIS — B3789 Other sites of candidiasis: Secondary | ICD-10-CM | POA: Diagnosis present

## 2022-07-01 DIAGNOSIS — T380X5A Adverse effect of glucocorticoids and synthetic analogues, initial encounter: Secondary | ICD-10-CM | POA: Diagnosis present

## 2022-07-01 DIAGNOSIS — Z1152 Encounter for screening for COVID-19: Secondary | ICD-10-CM | POA: Diagnosis not present

## 2022-07-01 DIAGNOSIS — I7300 Raynaud's syndrome without gangrene: Secondary | ICD-10-CM | POA: Diagnosis not present

## 2022-07-01 DIAGNOSIS — Z9221 Personal history of antineoplastic chemotherapy: Secondary | ICD-10-CM

## 2022-07-01 DIAGNOSIS — U071 COVID-19: Secondary | ICD-10-CM | POA: Diagnosis not present

## 2022-07-01 DIAGNOSIS — Z66 Do not resuscitate: Secondary | ICD-10-CM | POA: Diagnosis not present

## 2022-07-01 DIAGNOSIS — A4189 Other specified sepsis: Principal | ICD-10-CM | POA: Diagnosis present

## 2022-07-01 DIAGNOSIS — I890 Lymphedema, not elsewhere classified: Secondary | ICD-10-CM | POA: Diagnosis not present

## 2022-07-01 DIAGNOSIS — J159 Unspecified bacterial pneumonia: Secondary | ICD-10-CM | POA: Diagnosis not present

## 2022-07-01 DIAGNOSIS — E782 Mixed hyperlipidemia: Secondary | ICD-10-CM | POA: Diagnosis not present

## 2022-07-01 DIAGNOSIS — K219 Gastro-esophageal reflux disease without esophagitis: Secondary | ICD-10-CM | POA: Diagnosis present

## 2022-07-01 DIAGNOSIS — J129 Viral pneumonia, unspecified: Secondary | ICD-10-CM | POA: Diagnosis present

## 2022-07-01 DIAGNOSIS — N1831 Chronic kidney disease, stage 3a: Secondary | ICD-10-CM | POA: Diagnosis present

## 2022-07-01 DIAGNOSIS — Z9981 Dependence on supplemental oxygen: Secondary | ICD-10-CM

## 2022-07-01 DIAGNOSIS — Z9882 Breast implant status: Secondary | ICD-10-CM

## 2022-07-01 DIAGNOSIS — Z85850 Personal history of malignant neoplasm of thyroid: Secondary | ICD-10-CM

## 2022-07-01 DIAGNOSIS — G43909 Migraine, unspecified, not intractable, without status migrainosus: Secondary | ICD-10-CM | POA: Diagnosis present

## 2022-07-01 DIAGNOSIS — G4730 Sleep apnea, unspecified: Secondary | ICD-10-CM | POA: Diagnosis present

## 2022-07-01 DIAGNOSIS — J9601 Acute respiratory failure with hypoxia: Secondary | ICD-10-CM | POA: Diagnosis not present

## 2022-07-01 DIAGNOSIS — R4182 Altered mental status, unspecified: Secondary | ICD-10-CM | POA: Diagnosis not present

## 2022-07-01 DIAGNOSIS — I110 Hypertensive heart disease with heart failure: Secondary | ICD-10-CM | POA: Diagnosis not present

## 2022-07-01 DIAGNOSIS — I5032 Chronic diastolic (congestive) heart failure: Secondary | ICD-10-CM | POA: Diagnosis present

## 2022-07-01 DIAGNOSIS — G894 Chronic pain syndrome: Secondary | ICD-10-CM | POA: Diagnosis present

## 2022-07-01 DIAGNOSIS — J1282 Pneumonia due to coronavirus disease 2019: Secondary | ICD-10-CM | POA: Diagnosis not present

## 2022-07-01 DIAGNOSIS — K2210 Ulcer of esophagus without bleeding: Secondary | ICD-10-CM | POA: Diagnosis not present

## 2022-07-01 DIAGNOSIS — B9789 Other viral agents as the cause of diseases classified elsewhere: Secondary | ICD-10-CM | POA: Diagnosis present

## 2022-07-01 DIAGNOSIS — J440 Chronic obstructive pulmonary disease with acute lower respiratory infection: Secondary | ICD-10-CM | POA: Diagnosis not present

## 2022-07-01 DIAGNOSIS — R059 Cough, unspecified: Secondary | ICD-10-CM | POA: Diagnosis not present

## 2022-07-01 DIAGNOSIS — R918 Other nonspecific abnormal finding of lung field: Secondary | ICD-10-CM | POA: Diagnosis not present

## 2022-07-01 DIAGNOSIS — R251 Tremor, unspecified: Secondary | ICD-10-CM | POA: Diagnosis present

## 2022-07-01 DIAGNOSIS — E872 Acidosis, unspecified: Secondary | ICD-10-CM | POA: Diagnosis not present

## 2022-07-01 DIAGNOSIS — F419 Anxiety disorder, unspecified: Secondary | ICD-10-CM

## 2022-07-01 DIAGNOSIS — I878 Other specified disorders of veins: Secondary | ICD-10-CM | POA: Diagnosis not present

## 2022-07-01 DIAGNOSIS — J189 Pneumonia, unspecified organism: Secondary | ICD-10-CM | POA: Diagnosis not present

## 2022-07-01 DIAGNOSIS — Z8541 Personal history of malignant neoplasm of cervix uteri: Secondary | ICD-10-CM

## 2022-07-01 DIAGNOSIS — N179 Acute kidney failure, unspecified: Secondary | ICD-10-CM | POA: Diagnosis not present

## 2022-07-01 DIAGNOSIS — E271 Primary adrenocortical insufficiency: Secondary | ICD-10-CM | POA: Diagnosis present

## 2022-07-01 DIAGNOSIS — J441 Chronic obstructive pulmonary disease with (acute) exacerbation: Secondary | ICD-10-CM | POA: Diagnosis present

## 2022-07-01 DIAGNOSIS — I10 Essential (primary) hypertension: Secondary | ICD-10-CM | POA: Diagnosis present

## 2022-07-01 DIAGNOSIS — J9611 Chronic respiratory failure with hypoxia: Secondary | ICD-10-CM | POA: Diagnosis not present

## 2022-07-01 DIAGNOSIS — J209 Acute bronchitis, unspecified: Secondary | ICD-10-CM

## 2022-07-01 DIAGNOSIS — Z79899 Other long term (current) drug therapy: Secondary | ICD-10-CM

## 2022-07-01 DIAGNOSIS — I428 Other cardiomyopathies: Secondary | ICD-10-CM | POA: Diagnosis not present

## 2022-07-01 DIAGNOSIS — Z9013 Acquired absence of bilateral breasts and nipples: Secondary | ICD-10-CM

## 2022-07-01 DIAGNOSIS — G4700 Insomnia, unspecified: Secondary | ICD-10-CM | POA: Diagnosis present

## 2022-07-01 DIAGNOSIS — J984 Other disorders of lung: Secondary | ICD-10-CM | POA: Diagnosis present

## 2022-07-01 DIAGNOSIS — F411 Generalized anxiety disorder: Secondary | ICD-10-CM | POA: Diagnosis present

## 2022-07-01 DIAGNOSIS — B348 Other viral infections of unspecified site: Secondary | ICD-10-CM | POA: Diagnosis not present

## 2022-07-01 DIAGNOSIS — Z8572 Personal history of non-Hodgkin lymphomas: Secondary | ICD-10-CM

## 2022-07-01 DIAGNOSIS — Z888 Allergy status to other drugs, medicaments and biological substances status: Secondary | ICD-10-CM

## 2022-07-01 DIAGNOSIS — Z9102 Food additives allergy status: Secondary | ICD-10-CM

## 2022-07-01 DIAGNOSIS — Z7982 Long term (current) use of aspirin: Secondary | ICD-10-CM

## 2022-07-01 DIAGNOSIS — R7303 Prediabetes: Secondary | ICD-10-CM | POA: Diagnosis present

## 2022-07-01 DIAGNOSIS — A419 Sepsis, unspecified organism: Secondary | ICD-10-CM | POA: Diagnosis not present

## 2022-07-01 DIAGNOSIS — Z923 Personal history of irradiation: Secondary | ICD-10-CM

## 2022-07-01 DIAGNOSIS — Z79890 Hormone replacement therapy (postmenopausal): Secondary | ICD-10-CM

## 2022-07-01 DIAGNOSIS — E876 Hypokalemia: Secondary | ICD-10-CM | POA: Diagnosis present

## 2022-07-01 LAB — CBC WITH DIFFERENTIAL/PLATELET
Abs Immature Granulocytes: 0.07 10*3/uL (ref 0.00–0.07)
Basophils Absolute: 0.1 10*3/uL (ref 0.0–0.1)
Basophils Relative: 0 %
Eosinophils Absolute: 0 10*3/uL (ref 0.0–0.5)
Eosinophils Relative: 0 %
HCT: 32.2 % — ABNORMAL LOW (ref 36.0–46.0)
Hemoglobin: 9.8 g/dL — ABNORMAL LOW (ref 12.0–15.0)
Immature Granulocytes: 0 %
Lymphocytes Relative: 8 %
Lymphs Abs: 1.3 10*3/uL (ref 0.7–4.0)
MCH: 24.6 pg — ABNORMAL LOW (ref 26.0–34.0)
MCHC: 30.4 g/dL (ref 30.0–36.0)
MCV: 80.7 fL (ref 80.0–100.0)
Monocytes Absolute: 1 10*3/uL (ref 0.1–1.0)
Monocytes Relative: 6 %
Neutro Abs: 14.1 10*3/uL — ABNORMAL HIGH (ref 1.7–7.7)
Neutrophils Relative %: 86 %
Platelets: 328 10*3/uL (ref 150–400)
RBC: 3.99 MIL/uL (ref 3.87–5.11)
RDW: 17.9 % — ABNORMAL HIGH (ref 11.5–15.5)
WBC: 16.5 10*3/uL — ABNORMAL HIGH (ref 4.0–10.5)
nRBC: 0 % (ref 0.0–0.2)

## 2022-07-01 LAB — COMPREHENSIVE METABOLIC PANEL
ALT: 29 U/L (ref 0–44)
AST: 32 U/L (ref 15–41)
Albumin: 4 g/dL (ref 3.5–5.0)
Alkaline Phosphatase: 70 U/L (ref 38–126)
Anion gap: 13 (ref 5–15)
BUN: 25 mg/dL — ABNORMAL HIGH (ref 6–20)
CO2: 26 mmol/L (ref 22–32)
Calcium: 8.9 mg/dL (ref 8.9–10.3)
Chloride: 100 mmol/L (ref 98–111)
Creatinine, Ser: 1.55 mg/dL — ABNORMAL HIGH (ref 0.44–1.00)
GFR, Estimated: 40 mL/min — ABNORMAL LOW (ref >60)
Glucose, Bld: 138 mg/dL — ABNORMAL HIGH (ref 70–99)
Potassium: 3 mmol/L — ABNORMAL LOW (ref 3.5–5.1)
Sodium: 139 mmol/L (ref 135–145)
Total Bilirubin: 0.7 mg/dL (ref 0.3–1.2)
Total Protein: 7.6 g/dL (ref 6.5–8.1)

## 2022-07-01 LAB — BRAIN NATRIURETIC PEPTIDE: B Natriuretic Peptide: 63.5 pg/mL (ref 0.0–100.0)

## 2022-07-01 LAB — RESP PANEL BY RT-PCR (FLU A&B, COVID) ARPGX2
Influenza A by PCR: NEGATIVE
Influenza B by PCR: NEGATIVE
SARS Coronavirus 2 by RT PCR: NEGATIVE

## 2022-07-01 LAB — LIPASE, BLOOD: Lipase: 44 U/L (ref 11–51)

## 2022-07-01 LAB — LACTIC ACID, PLASMA: Lactic Acid, Venous: 3 mmol/L (ref 0.5–1.9)

## 2022-07-01 MED ORDER — LACTATED RINGERS IV BOLUS
1000.0000 mL | Freq: Once | INTRAVENOUS | Status: AC
  Administered 2022-07-01: 1000 mL via INTRAVENOUS

## 2022-07-01 MED ORDER — POTASSIUM CHLORIDE CRYS ER 10 MEQ PO TBCR
40.0000 meq | EXTENDED_RELEASE_TABLET | Freq: Every day | ORAL | Status: DC
  Administered 2022-07-01 – 2022-07-02 (×2): 40 meq via ORAL
  Filled 2022-07-01 (×5): qty 4

## 2022-07-01 MED ORDER — PANTOPRAZOLE SODIUM 40 MG PO TBEC
40.0000 mg | DELAYED_RELEASE_TABLET | Freq: Every day | ORAL | Status: DC
  Administered 2022-07-02 – 2022-07-08 (×6): 40 mg via ORAL
  Filled 2022-07-01 (×7): qty 1

## 2022-07-01 MED ORDER — ZOLPIDEM TARTRATE 5 MG PO TABS
5.0000 mg | ORAL_TABLET | Freq: Every evening | ORAL | Status: DC | PRN
  Administered 2022-07-01 – 2022-07-08 (×8): 5 mg via ORAL
  Filled 2022-07-01 (×8): qty 1

## 2022-07-01 MED ORDER — MOMETASONE FURO-FORMOTEROL FUM 200-5 MCG/ACT IN AERO
2.0000 | INHALATION_SPRAY | Freq: Two times a day (BID) | RESPIRATORY_TRACT | Status: DC
  Administered 2022-07-02 – 2022-07-03 (×3): 2 via RESPIRATORY_TRACT
  Filled 2022-07-01: qty 8.8

## 2022-07-01 MED ORDER — METHYLPREDNISOLONE SODIUM SUCC 125 MG IJ SOLR
100.0000 mg | Freq: Two times a day (BID) | INTRAMUSCULAR | Status: DC
  Administered 2022-07-01 – 2022-07-03 (×4): 100 mg via INTRAVENOUS
  Filled 2022-07-01 (×4): qty 2

## 2022-07-01 MED ORDER — ESCITALOPRAM OXALATE 20 MG PO TABS
20.0000 mg | ORAL_TABLET | Freq: Every evening | ORAL | Status: DC
  Administered 2022-07-02 – 2022-07-08 (×7): 20 mg via ORAL
  Filled 2022-07-01 (×8): qty 1

## 2022-07-01 MED ORDER — SODIUM CHLORIDE 0.9 % IV SOLN
500.0000 mg | Freq: Once | INTRAVENOUS | Status: AC
  Administered 2022-07-01: 500 mg via INTRAVENOUS
  Filled 2022-07-01: qty 5

## 2022-07-01 MED ORDER — SODIUM CHLORIDE 0.9 % IV SOLN
2.0000 g | Freq: Once | INTRAVENOUS | Status: AC
  Administered 2022-07-01: 2 g via INTRAVENOUS
  Filled 2022-07-01: qty 12.5

## 2022-07-01 MED ORDER — DIPHENHYDRAMINE HCL 50 MG/ML IJ SOLN
25.0000 mg | Freq: Four times a day (QID) | INTRAMUSCULAR | Status: DC | PRN
  Administered 2022-07-01 – 2022-07-04 (×10): 25 mg via INTRAVENOUS
  Filled 2022-07-01 (×10): qty 1

## 2022-07-01 MED ORDER — RIMEGEPANT SULFATE 75 MG PO TBDP: 75.0000 mg | ORAL_TABLET | Freq: Every day | ORAL | Status: DC | PRN

## 2022-07-01 MED ORDER — TOPIRAMATE 25 MG PO TABS
150.0000 mg | ORAL_TABLET | Freq: Every day | ORAL | Status: DC
  Administered 2022-07-01 – 2022-07-08 (×8): 150 mg via ORAL
  Filled 2022-07-01 (×8): qty 2

## 2022-07-01 MED ORDER — INSULIN ASPART 100 UNIT/ML IJ SOLN: 0.0000 [IU] | Freq: Every day | INTRAMUSCULAR | Status: DC

## 2022-07-01 MED ORDER — DRONABINOL 2.5 MG PO CAPS
2.5000 mg | ORAL_CAPSULE | Freq: Two times a day (BID) | ORAL | Status: DC | PRN
  Administered 2022-07-06 – 2022-07-08 (×2): 2.5 mg via ORAL
  Filled 2022-07-01 (×3): qty 1

## 2022-07-01 MED ORDER — ACETAMINOPHEN 500 MG PO TABS
ORAL_TABLET | ORAL | Status: AC
  Filled 2022-07-01: qty 2

## 2022-07-01 MED ORDER — HYDROMORPHONE HCL 1 MG/ML IJ SOLN
0.5000 mg | Freq: Four times a day (QID) | INTRAMUSCULAR | Status: DC | PRN
  Administered 2022-07-01 – 2022-07-03 (×8): 0.5 mg via INTRAVENOUS
  Filled 2022-07-01 (×8): qty 0.5

## 2022-07-01 MED ORDER — ALBUTEROL SULFATE (2.5 MG/3ML) 0.083% IN NEBU
2.5000 mg | INHALATION_SOLUTION | RESPIRATORY_TRACT | Status: DC | PRN
  Administered 2022-07-02 (×2): 2.5 mg via RESPIRATORY_TRACT
  Filled 2022-07-01 (×4): qty 3

## 2022-07-01 MED ORDER — IBUPROFEN 200 MG PO TABS
400.0000 mg | ORAL_TABLET | Freq: Four times a day (QID) | ORAL | Status: DC | PRN
  Administered 2022-07-04 – 2022-07-09 (×6): 400 mg via ORAL
  Filled 2022-07-01 (×7): qty 2

## 2022-07-01 MED ORDER — INSULIN ASPART 100 UNIT/ML IJ SOLN
0.0000 [IU] | Freq: Three times a day (TID) | INTRAMUSCULAR | Status: DC
  Administered 2022-07-02 (×2): 2 [IU] via SUBCUTANEOUS
  Administered 2022-07-03 (×2): 3 [IU] via SUBCUTANEOUS
  Administered 2022-07-04: 2 [IU] via SUBCUTANEOUS
  Administered 2022-07-05: 3 [IU] via SUBCUTANEOUS

## 2022-07-01 MED ORDER — HYDROCORTISONE SOD SUC (PF) 100 MG IJ SOLR
100.0000 mg | Freq: Once | INTRAMUSCULAR | Status: AC
  Administered 2022-07-01: 100 mg via INTRAVENOUS
  Filled 2022-07-01: qty 2

## 2022-07-01 MED ORDER — DIPHENHYDRAMINE HCL 50 MG/ML IJ SOLN
25.0000 mg | Freq: Once | INTRAMUSCULAR | Status: AC
  Administered 2022-07-01: 25 mg via INTRAVENOUS
  Filled 2022-07-01: qty 1

## 2022-07-01 MED ORDER — LAMOTRIGINE 25 MG PO TABS
150.0000 mg | ORAL_TABLET | Freq: Every day | ORAL | Status: DC
  Administered 2022-07-01 – 2022-07-08 (×8): 150 mg via ORAL
  Filled 2022-07-01 (×9): qty 2

## 2022-07-01 MED ORDER — ACETAMINOPHEN 500 MG PO TABS
1000.0000 mg | ORAL_TABLET | Freq: Once | ORAL | Status: AC
Start: 2022-07-01 — End: 2022-07-01
  Administered 2022-07-01: 1000 mg via ORAL
  Filled 2022-07-01: qty 2

## 2022-07-01 MED ORDER — FLUCONAZOLE 150 MG PO TABS
150.0000 mg | ORAL_TABLET | Freq: Once | ORAL | Status: DC
  Filled 2022-07-01: qty 1

## 2022-07-01 MED ORDER — CLONAZEPAM 0.5 MG PO TABS
0.5000 mg | ORAL_TABLET | Freq: Two times a day (BID) | ORAL | Status: DC | PRN
  Administered 2022-07-01 – 2022-07-02 (×2): 0.5 mg via ORAL
  Filled 2022-07-01 (×2): qty 1

## 2022-07-01 MED ORDER — BUPROPION HCL ER (XL) 150 MG PO TB24
150.0000 mg | ORAL_TABLET | Freq: Every day | ORAL | Status: DC
  Administered 2022-07-02 – 2022-07-08 (×7): 150 mg via ORAL
  Filled 2022-07-01 (×7): qty 1

## 2022-07-01 MED ORDER — SODIUM CHLORIDE 0.9 % IV SOLN
2.0000 g | Freq: Three times a day (TID) | INTRAVENOUS | Status: DC
  Administered 2022-07-01 – 2022-07-02 (×2): 2 g via INTRAVENOUS
  Filled 2022-07-01 (×2): qty 12.5

## 2022-07-01 MED ORDER — GUAIFENESIN ER 600 MG PO TB12
600.0000 mg | ORAL_TABLET | Freq: Two times a day (BID) | ORAL | Status: DC
  Administered 2022-07-01 – 2022-07-03 (×5): 600 mg via ORAL
  Filled 2022-07-01 (×6): qty 1

## 2022-07-01 MED ORDER — AZITHROMYCIN 250 MG PO TABS
500.0000 mg | ORAL_TABLET | Freq: Every day | ORAL | Status: DC
  Administered 2022-07-02: 500 mg via ORAL
  Filled 2022-07-01: qty 2

## 2022-07-01 MED ORDER — LEVOTHYROXINE SODIUM 100 MCG PO TABS
100.0000 ug | ORAL_TABLET | Freq: Every morning | ORAL | Status: DC
  Administered 2022-07-02 – 2022-07-09 (×8): 100 ug via ORAL
  Filled 2022-07-01 (×8): qty 1

## 2022-07-01 MED ORDER — ENOXAPARIN SODIUM 40 MG/0.4ML IJ SOSY
40.0000 mg | PREFILLED_SYRINGE | INTRAMUSCULAR | Status: DC
  Administered 2022-07-01 – 2022-07-08 (×8): 40 mg via SUBCUTANEOUS
  Filled 2022-07-01 (×8): qty 0.4

## 2022-07-01 NOTE — Progress Notes (Signed)
A consult was received from an ED physician for cefepime per pharmacy dosing.  The patient's profile has been reviewed for ht/wt/allergies/indication/available labs.   A one time order has been placed for cefepime 2g.  Further antibiotics/pharmacy consults should be ordered by admitting physician if indicated.                       Thank you, Peggyann Juba, PharmD, BCPS 07/01/2022  12:35 PM

## 2022-07-01 NOTE — Telephone Encounter (Signed)
Patient called answering service for fever and shortness of breath  She does report fever, shortness of breath, desaturating into the low 80s despite being on 3 to 4 L usually. Waking up in the middle of the night to use nebulization treatment without improvement in symptoms Relative hypotension, tachycardic in the 130s  Was recently treated for sepsis  Patient encouraged to go to the emergency department to be evaluated in person so that more testing can be done and appropriate measures to treat

## 2022-07-01 NOTE — ED Triage Notes (Signed)
Pt presents with c/o cough x 3 days. Pt has a significant pulmonary hx and has home O2 when she needs it as well as Addison's disease. Pt reports she has had a dry cough for 3 days and has had to use repeated doses of her inhaler and nebs as well as her oxygen.

## 2022-07-01 NOTE — Progress Notes (Signed)
Pt has arrived to Room 1504 from ED.  Alert and oriented.

## 2022-07-01 NOTE — Progress Notes (Signed)
Pt refused labs at this time. MD made aware.

## 2022-07-01 NOTE — Progress Notes (Addendum)
Pharmacy Antibiotic Note  [name] is a 51 y.o. female admitted on 07/01/2022. Pharmacy has been consulted for cefepime dosing.  Plan: Cefepime 2 g IV q8h     Temp (24hrs), Avg:100.6 F (38.1 C), Min:98.8 F (37.1 C), Max:102.3 F (39.1 C)  Recent Labs  Lab 07/01/22 1336  WBC 16.5*  CREATININE 1.55*    CrCl cannot be calculated (Unknown ideal weight.).    Allergies  Allergen Reactions   Ferrous Bisglycinate Chelate [Iron] Anaphylaxis    Only IV    Mushroom Extract Complex Anaphylaxis   Na Ferric Gluc Cplx In Sucrose Anaphylaxis   Cymbalta [Duloxetine Hcl] Swelling and Anxiety   Ondansetron Hcl Other (See Comments)   Promethazine Other (See Comments)    Other reaction(s): Unknown   Promethazine Hcl     Other reaction(s): Unknown   Succinylcholine Other (See Comments)    Lock Jaw   Buprenorphine Hcl Hives   Compazine Other (See Comments)    Altered mental status Aggression   Metoclopramide Other (See Comments)    Dystonia   Morphine And Related Hives   Ondansetron Hives and Rash    Other reaction(s): Unknown Other reaction(s): Unknown   Promethazine Hcl Hives   Tegaderm Ag Mesh [Silver] Rash    Old formulation only, is able to tolerate new formulation    Antimicrobials this admission: Cefepime 10/1 >> Azithromycin 10/1 >>  Microbiology results: 10/1 BCx: pending   Thank you for allowing pharmacy to be a part of this patient's care.  Tawnya Crook, PharmD, BCPS Clinical Pharmacist 07/01/2022 3:44 PM

## 2022-07-01 NOTE — ED Provider Notes (Signed)
[place] DEPT Provider Note   CSN: [id number] Arrival date & time: 07/01/22  1101     History  Chief Complaint  Patient presents with   Cough    [name] is a 51 y.o. female.   Cough Patient presents with shortness of breath and cough.  Has around 3 days.  History of significant pulmonary disease on 3 to 4 L of baseline.  Has had sats of been going down to the 70s at home.  Using inhaler without relief.  Found to have a fever.  History of recurrent pneumonias recurrent bacteremia is also thought to be secondary to IV access/ports that she has had.  History of Addison's disease also.  Feels as if she is on an addisonian crisis also.  Temp of 102.3 upon arrival.  Does have small wound on right lower leg.     Home Medications Prior to Admission medications   Medication Sig Start Date End Date Taking? Authorizing Provider  albuterol (PROVENTIL) (2.5 MG/3ML) 0.083% nebulizer solution Take 3 mLs (2.5 mg total) by nebulization every 6 (six) hours as needed for wheezing or shortness of breath. 03/27/21 06/14/22  Magdalen Spatz, NP  albuterol (VENTOLIN HFA) 108 (90 Base) MCG/ACT inhaler Inhale 2 puffs into the lungs every 6 (six) hours as needed for wheezing or shortness of breath.    [provider]  aspirin 325 MG tablet Take 325 mg by mouth at bedtime.     [provider]  bumetanide (BUMEX) 1 MG tablet TAKE 1 TABLET BY MOUTH TWICE A DAY Patient taking differently: Take 1 mg by mouth 2 (two) times daily. 07/17/21   Nicholas Lose, MD  buPROPion (WELLBUTRIN XL) 150 MG 24 hr tablet TAKE 1 TABLET BY MOUTH EVERY DAY 05/30/22   Mozingo, Berdie Ogren, NP  clonazePAM (KLONOPIN) 0.5 MG tablet Take 1 tablet (0.5 mg total) by mouth 2 (two) times daily as needed for anxiety. 06/19/22   Mozingo, Berdie Ogren, NP  dexlansoprazole (DEXILANT) 60 MG capsule Take 60 mg by mouth daily.    [provider]  Diclofenac Potassium,Migraine,  (CAMBIA) 50 MG PACK Take 1 packet by mouth daily as needed for migraine. 05/16/21   [provider]  dronabinol (MARINOL) 2.5 MG capsule Take 1 capsule (2.5 mg total) by mouth 2 (two) times daily as needed (nausea). 05/07/22   Nicholas Lose, MD  EPINEPHrine 0.3 mg/0.3 mL IJ SOAJ injection Inject 0.3 mg into the muscle as needed for anaphylaxis. 06/13/21   Margaretha Seeds, MD  escitalopram (LEXAPRO) 20 MG tablet Take 1 tablet (20 mg total) by mouth every evening. 11/08/21   Mozingo, Berdie Ogren, NP  fluconazole (DIFLUCAN) 150 MG tablet TAKE 1 TABLET (150 MG TOTAL) BY MOUTH DAILY. IF NEEDED FOR YEAST INFECTION 04/27/22   Carlyle Basques, MD  fluticasone-salmeterol (ADVAIR HFA) [phone] MCG/ACT inhaler Inhale 2 puffs into the lungs 2 (two) times daily. 02/02/22   Margaretha Seeds, MD  Galcanezumab-gnlm 120 MG/ML SOAJ Inject 120 mg into the skin every 28 (twenty-eight) days.  03/31/19   [provider]  heparin lock flush 100 UNIT/ML SOLN injection USE 5 MLS AS A HEPLOCK IN PORT A CATH ONCE DAILY AFTER MEDICATION ADMINISTRATION OR AS DIRECTED BY PHYSICIAN. 10/30/21 10/30/22  Nicholas Lose, MD  hydrocortisone (CORTEF) 10 MG tablet Take 1-2 tablets (10-20 mg total) by mouth See admin instructions. Take 20 mg in the am and 45m in the evening Patient taking differently: Take 10-20 mg by mouth  See admin instructions. Take 20 mg by mouth in the morning and 12m in the evening 02/17/20   KCherylann RatelA, DO  lamoTRIgine (LAMICTAL) 150 MG tablet Take one tablet at bedtime. 06/19/22   Mozingo, RBerdie Ogren NP  Lancets ([facility] [id number] MISC 3 (three) times daily. for testing 06/12/21   [provider]  metoprolol succinate (TOPROL-XL) 25 MG 24 hr tablet Take 37.5 mg by mouth daily.     [provider]  [facility] test strip 3 (three) times daily. for testing 06/12/21   [provider]  OXYGEN Inhale 3-4 L into the lungs continuous.    [provider]   potassium chloride (KLOR-CON) 10 MEQ tablet Take 1 tablet (10 mEq total) by mouth daily for 21 days. 07/15/20 06/14/22  PAlfredia Client PA-C  predniSONE (DELTASONE) 10 MG tablet Take 4 tabs x 2 days, then 3 x 2d, then 2 x 2d, then 1 x 2d and STOP 03/27/21   GMagdalen Spatz NP  Rimegepant Sulfate (NURTEC) 75 MG TBDP Take 75 mg by mouth daily as needed (Migraine).     [provider]  rosuvastatin (CRESTOR) 10 MG tablet Take 10 mg by mouth daily.  02/28/18   [provider]  sitaGLIPtin (JANUVIA) 100 MG tablet Take 1 tablet (100 mg total) by mouth daily. 10/19/21   GNicholas Lose MD  Sodium Chloride Flush (NORMAL SALINE FLUSH) 0.9 % SOLN FLUSH WITH TWO 10 ML FLUSHES IN PORT BEFORE AND AFTER FLUIDS AND MEDICATION ADMINISTRATION, TO MAINTAIN PATENCY--USE UP TO 4 TIMES DAILY. 10/30/21 10/30/22  GNicholas Lose MD  sucralfate (CARAFATE) 1 g tablet Take 1 g by mouth daily as needed (FOR ULCERS).     [provider]  SYNTHROID 100 MCG tablet Take 1 tablet (100 mcg total) by mouth every morning. 10/19/21   GNicholas Lose MD  topiramate (TOPAMAX) 50 MG tablet Take 150 mg by mouth at bedtime.  12/25/19   [provider]  zolpidem (AMBIEN CR) 12.5 MG CR tablet Take 12.5 mg by mouth at bedtime. 03/05/22   [provider]      Allergies    Ferrous bisglycinate chelate [iron], Mushroom extract complex, Na ferric gluc cplx in sucrose, Cymbalta [duloxetine hcl], Ondansetron hcl, Promethazine, Promethazine hcl, Succinylcholine, Buprenorphine hcl, Compazine, Metoclopramide, Morphine and related, Ondansetron, Promethazine hcl, and Tegaderm ag mesh [silver]    Review of Systems   Review of Systems  Respiratory:  Positive for cough.     Physical Exam Updated Vital Signs BP (!) 154/121   Pulse (!) 109   Temp (!) 102.3 F (39.1 C) (Oral)   Resp 18   LMP  (LMP Unknown)   SpO2 100%  Physical Exam Vitals and nursing note reviewed.  Eyes:     Extraocular Movements: Extraocular  movements intact.  Cardiovascular:     Rate and Rhythm: Tachycardia present.  Pulmonary:     Comments: Mildly harsh breath sounds without focal rales or rhonchi. Abdominal:     Tenderness: There is no abdominal tenderness.  Musculoskeletal:        General: No tenderness.     Right lower leg: Edema present.     Left lower leg: Edema present.  Skin:    General: Skin is warm.     Capillary Refill: Capillary refill takes less than 2 seconds.  Neurological:     Mental Status: She is alert and oriented to person, place, and time.     ED Results / Procedures /  Treatments   Labs (all labs ordered are listed, but only abnormal results are displayed) Labs Reviewed  COMPREHENSIVE METABOLIC PANEL - Abnormal; Notable for the following components:      Result Value   Potassium 3.0 (*)    Glucose, Bld 138 (*)    BUN 25 (*)    Creatinine, Ser 1.55 (*)    GFR, Estimated 40 (*)    All other components within normal limits  CBC WITH DIFFERENTIAL/PLATELET - Abnormal; Notable for the following components:   WBC 16.5 (*)    Hemoglobin 9.8 (*)    HCT 32.2 (*)    MCH 24.6 (*)    RDW 17.9 (*)    Neutro Abs 14.1 (*)    All other components within normal limits  RESP PANEL BY RT-PCR (FLU A&B, COVID) [id number]  CULTURE, BLOOD (ROUTINE X 2)  CULTURE, BLOOD (ROUTINE X 2)  LIPASE, BLOOD  BRAIN NATRIURETIC PEPTIDE  LACTIC ACID, PLASMA  LACTIC ACID, PLASMA    EKG None  Radiology DG Chest 2 View  Result Date: 07/01/2022 CLINICAL DATA:  Cough for 3 days. EXAM: CHEST - 2 VIEW COMPARISON:  04/05/2022.  CT, 04/04/2020. FINDINGS: Cardiac silhouette is normal in size and configuration. Normal mediastinal and hilar contours. Streaky and patchy opacity in the left lower lobe. Remainder of the lungs is clear. No pleural effusion or pneumothorax. Skeletal structures are intact.  Previous breast surgery. IMPRESSION: 1. Left lower lobe opacity consistent with pneumonia. Electronically Signed   By: Lajean Manes  M.D.   On: 07/01/2022 11:50    Procedures Procedures    Medications Ordered in ED Medications  acetaminophen (TYLENOL) 500 MG tablet (  Not Given 07/01/22 1334)  acetaminophen (TYLENOL) tablet 1,000 mg (1,000 mg Oral Given 07/01/22 1301)  hydrocortisone sodium succinate (SOLU-CORTEF) 100 MG injection 100 mg (100 mg Intravenous Given 07/01/22 1307)  azithromycin (ZITHROMAX) 500 mg in sodium chloride 0.9 % 250 mL IVPB (500 mg Intravenous New Bag/Given 07/01/22 1308)  ceFEPIme (MAXIPIME) 2 g in sodium chloride 0.9 % 100 mL IVPB (0 g Intravenous Stopped 07/01/22 1352)  diphenhydrAMINE (BENADRYL) injection 25 mg (25 mg Intravenous Given 07/01/22 1311)  lactated ringers bolus 1,000 mL (1,000 mLs Intravenous New Bag/Given 07/01/22 1312)    ED Course/ Medical Decision Making/ A&P                           Medical Decision Making Amount and/or Complexity of Data Reviewed Labs: ordered.  Risk Prescription drug management.   Patient with fever.  Cough.  X-ray shows pneumonia.  Differential diagnosis includes bacteremia or sepsis also.  Has a history of Addison's disease.  Discussed with Dr[facility] Juleen China [facility]  He is familiar with the patient.  We have reviewed previous blood cultures and notes and feel the best antibiotic at this point is cefepime.  We will also add some azithromycin for atypical coverage.  Will require admission to hospital.  Blood pressure mildly decreased but potentially is due to Addison's disease and not necessarily severe sepsis.  We will get blood cultures blood work lactic acid.  We will give stress dose steroids and will likely require admission to hospital for antibiotics and further treatment.  Blood pressure his one episode rate dipped slightly but otherwise is remained good.  Some tachycardia.  Small fluid bolus given.  Chest x-ray showed pneumonia.  Will admit to internal medicine.        Final Clinical Impression(s) /  ED Diagnoses Final diagnoses:   Community acquired pneumonia, unspecified laterality  Addison's disease [facility])    Rx / DC Orders ED Discharge Orders     None         Davonna Belling, MD 07/01/22 1506

## 2022-07-01 NOTE — Progress Notes (Signed)
Pt refused blood sugar check and insulin tonight despite education. Pt states that she "doesn't take insulin". MD notified and made aware. Plan of care ongoing.

## 2022-07-01 NOTE — H&P (Signed)
History and Physical    [name] RWC:[id number] DOB: Oct 17, 1970 DOA: 07/01/2022  PCP: Su Grand, MD  Patient coming from: Home  I have personally briefly reviewed patient's old medical records available.   Chief Complaint: Shortness of breath  HPI: [name] is a 51 y.o. 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  Patient was having dry cough and shortness of breath despite using multiple doses of inhalers at home, she called her lung doctor office and they directed her to come to emergency room.  Feels very weak and fatigued. She also has chronic exacerbating symptoms including Nausea but no vomiting.  Allergic to nausea medications but can use Benadryl. She has some swelling of the legs and feet which is mostly chronic and gets worse when she is admitted to the hospital. Generalized body ache.  Tired.  Cough with greenish sputum.  ED Course: Temperature 102.3, WBC count 16.5, blood pressures 90/58 responded with IV fluids.  Lactic acid 3, Creatinine 1.55, potassium 3.  Chest x-ray with left lower lobe pneumonia.  Given a stress dose of steroids, cefepime and azithromycin after discussion with infectious disease.  Cultures were drawn.  Review of Systems: all systems are reviewed and pertinent positive as per HPI otherwise rest are negative.  Denies any urinary changes.   Denies any diarrhea.  No sick contacts.  COVID-19 and influenza negative.   Past Medical History:  Diagnosis Date   Addison's disease ([place])    Adrenal insufficiency ([place])    Anemia    Anxiety    Aortic stenosis    Aortic stenosis    Appendicitis 12/19/09   Appendicitis    Breast cancer (HCC)    STATUS POST BILATERAL MASTECTOMY. STATUS POST RECONSTRUCTION. SHE HAD SILICONE BREAST IMPLANTS AND THE LEFT IMPLANT IS LEAKING SLIGHTLY   Cellulitis of right middle finger 11/07/2018   Cervical cancer ([place]) 12/23/2018   Chest pain    CHF with right heart failure ([place]) 04/17/2017   Chronic respiratory failure with hypoxia (HCC) 12/23/2018   Cough variant asthma 04/13/2019   Depression    GERD (gastroesophageal reflux disease)    takes Dexilant and carafate and gi coctail    Headache    migraines on a daily and monthly regimen    Heart murmur    History of kidney stones    Hodgkin lymphoma ([place])    STATUS POST MANTLE RADIATION   Hodgkin's lymphoma ([place])    1987   Hypertension    Hypoxia    Necrotizing fasciitis ([place]) 12/23/2018   Non-ischemic cardiomyopathy ([place])    Osteoporosis    Palpitations    Pituitary adenoma ([place]) 12/23/2018   Pneumonia    PONV (postoperative nausea and vomiting)    Pre-diabetes    per pt; no meds   Pulmonary hypertension ([place]) 12/23/2018   Raynaud phenomenon    Right heart failure ([place]) 04/17/2017   Seizures ([place])    last febrile seizure was approx 3 weeks ago per report on 12/01/2020   Sleep apnea  upcoming sleep study per pt    Supplemental oxygen dependent    3 liters   SVT (supraventricular tachycardia)    Tachycardia    Thyroid cancer ([place])    STATUS POST SURGICAL REMOVAL-CURRENT ON THYROID REPLACEMENT    Past Surgical History:  Procedure Laterality Date   ABDOMINAL HYSTERECTOMY     AMPUTATION Left 01/30/2019   Procedure: Left Index finger amputation with flap reconstruction and repair reconstruction;  Surgeon: Roseanne Kaufman, MD;  Location: [facility];   Service: Orthopedics;  Laterality: Left;   APPENDECTOMY     breast implants and removal      breast implants but leaking      CARDIAC CATHETERIZATION  05/18/09   NORMAL CATH   COLONOSCOPY     hx of chemotherapy      hx of radiation therapy      I & D EXTREMITY Left 12/23/2018   Procedure: IRRIGATION AND DEBRIDEMENT HAND / INDEX FINGER;  Surgeon: Roseanne Kaufman, MD;  Location: [facility];  Service: Orthopedics;  Laterality: Left;   IR CV LINE INJECTION  03/22/2022   IR IMAGING GUIDED PORT INSERTION  05/06/2020   IR IMAGING GUIDED PORT INSERTION  12/04/2021   IR REMOVAL TUN ACCESS W/ PORT W/O FL MOD SED  04/27/2020   IR REMOVAL TUN ACCESS W/ PORT W/O FL MOD SED  12/04/2021   IR REMOVAL TUN ACCESS W/ PORT W/O FL MOD SED  03/30/2022   KIDNEY STONE SURGERY     LUMBAR PUNCTURE W/ INTRATHECAL CHEMOTHERAPY     MASTECTOMY     PITUITARY SURGERY     RIGHT/LEFT HEART CATH AND CORONARY ANGIOGRAPHY N/A 04/02/2018   Procedure: RIGHT/LEFT HEART CATH AND CORONARY ANGIOGRAPHY;  Surgeon: Burnell Blanks, MD;  Location: [facility];  Service: Cardiovascular;  Laterality: N/A;   TOOTH EXTRACTION N/A 12/05/2020   Procedure: DENTAL RESTORATION/EXTRACTIONS;  Surgeon: Ronal Fear, MD;  Location: [facility];  Service: Oral Surgery;  Laterality: N/A;   TOTAL THYROIDECTOMY     VIDEO BRONCHOSCOPY Bilateral 11/14/2018   Procedure: VIDEO BRONCHOSCOPY WITHOUT FLUORO;  Surgeon: Margaretha Seeds, MD;  Location: [facility];  Service: Cardiopulmonary;  Laterality: Bilateral;   VIDEO BRONCHOSCOPY WITH ENDOBRONCHIAL ULTRASOUND N/A 11/19/2018   Procedure: VIDEO BRONCHOSCOPY WITH ENDOBRONCHIAL ULTRASOUND;  Surgeon: Margaretha Seeds, MD;  Location: [facility];  Service: Thoracic;  Laterality: N/A;    Social history   reports that she has never smoked. She has never used smokeless tobacco. She reports that she does not currently use alcohol. She reports that she does not use drugs.  Allergies  Allergen Reactions    Ferrous Bisglycinate Chelate [Iron] Anaphylaxis and Other (See Comments)    Only IV - only FERRLICET   Mushroom Extract Complex Anaphylaxis   Na Ferric Gluc Cplx In Sucrose Anaphylaxis   Cymbalta [Duloxetine Hcl] Swelling and Anxiety   Ondansetron Hcl Other (See Comments)   Promethazine Other (See Comments)    Other reaction(s): Unknown   Promethazine Hcl     Other reaction(s): Unknown   Succinylcholine Other (See Comments)    Lock Jaw   Buprenorphine Hcl Hives   Compazine Other (See Comments)    Altered mental status Aggression   Metoclopramide Other (See Comments)    Dystonia   Morphine And Related Hives   Ondansetron Hives and Rash    Other reaction(s): Unknown Other reaction(s): Unknown   Promethazine Hcl Hives   Tegaderm Ag Mesh [Silver] Rash    Old formulation only, is  able to tolerate new formulation    Family History  Family history unknown: Yes     Prior to Admission medications   Medication Sig Start Date End Date Taking? Authorizing Provider  albuterol (PROVENTIL) (2.5 MG/3ML) 0.083% nebulizer solution Take 3 mLs (2.5 mg total) by nebulization every 6 (six) hours as needed for wheezing or shortness of breath. 03/27/21 06/14/22  Magdalen Spatz, NP  albuterol (VENTOLIN HFA) 108 (90 Base) MCG/ACT inhaler Inhale 2 puffs into the lungs every 6 (six) hours as needed for wheezing or shortness of breath.    [provider]  aspirin 325 MG tablet Take 325 mg by mouth at bedtime.     [provider]  bumetanide (BUMEX) 1 MG tablet TAKE 1 TABLET BY MOUTH TWICE A DAY Patient taking differently: Take 1 mg by mouth 2 (two) times daily. 07/17/21   Nicholas Lose, MD  buPROPion (WELLBUTRIN XL) 150 MG 24 hr tablet TAKE 1 TABLET BY MOUTH EVERY DAY 05/30/22   Mozingo, Berdie Ogren, NP  clonazePAM (KLONOPIN) 0.5 MG tablet Take 1 tablet (0.5 mg total) by mouth 2 (two) times daily as needed for anxiety. 06/19/22   Mozingo, Berdie Ogren, NP  dexlansoprazole (DEXILANT)  60 MG capsule Take 60 mg by mouth daily.    [provider]  Diclofenac Potassium,Migraine, (CAMBIA) 50 MG PACK Take 1 packet by mouth daily as needed for migraine. 05/16/21   [provider]  dronabinol (MARINOL) 2.5 MG capsule Take 1 capsule (2.5 mg total) by mouth 2 (two) times daily as needed (nausea). 05/07/22   Nicholas Lose, MD  EPINEPHrine 0.3 mg/0.3 mL IJ SOAJ injection Inject 0.3 mg into the muscle as needed for anaphylaxis. 06/13/21   Margaretha Seeds, MD  escitalopram (LEXAPRO) 20 MG tablet Take 1 tablet (20 mg total) by mouth every evening. 11/08/21   Mozingo, Berdie Ogren, NP  fluconazole (DIFLUCAN) 150 MG tablet TAKE 1 TABLET (150 MG TOTAL) BY MOUTH DAILY. IF NEEDED FOR YEAST INFECTION 04/27/22   Carlyle Basques, MD  fluticasone-salmeterol (ADVAIR HFA) [id number] MCG/ACT inhaler Inhale 2 puffs into the lungs 2 (two) times daily. 02/02/22   Margaretha Seeds, MD  Galcanezumab-gnlm 120 MG/ML SOAJ Inject 120 mg into the skin every 28 (twenty-eight) days.  03/31/19   [provider]  heparin lock flush 100 UNIT/ML SOLN injection USE 5 MLS AS A HEPLOCK IN PORT A CATH ONCE DAILY AFTER MEDICATION ADMINISTRATION OR AS DIRECTED BY PHYSICIAN. 10/30/21 10/30/22  Nicholas Lose, MD  hydrocortisone (CORTEF) 10 MG tablet Take 1-2 tablets (10-20 mg total) by mouth See admin instructions. Take 20 mg in the am and 68m in the evening Patient taking differently: Take 10-20 mg by mouth See admin instructions. Take 20 mg by mouth in the morning and 120min the evening 02/17/20   KyCherylann Ratel, DO  lamoTRIgine (LAMICTAL) 150 MG tablet Take one tablet at bedtime. 06/19/22   Mozingo, ReBerdie OgrenNP  Lancets (OAscension Borgess-Lee Memorial HospitalELICA PLUS [id number] 3 (three) times daily. for testing 06/12/21   [provider]  metoprolol succinate (TOPROL-XL) 25 MG 24 hr tablet Take 37.5 mg by mouth daily.     [provider]  ON[facility] Hospital[facility]IO test strip 3 (three) times daily. for testing 06/12/21    [provider]  OXYGEN Inhale 3-4 L into the lungs continuous.    [provider]  potassium chloride (KLOR-CON) 10 MEQ tablet Take 1 tablet (10 mEq total) by mouth daily for 21 days. 07/15/20 06/14/22  Alfredia Client, PA-C  predniSONE (DELTASONE) 10 MG tablet Take 4 tabs x 2 days, then 3 x 2d, then 2 x 2d, then 1 x 2d and STOP 03/27/21   Magdalen Spatz, NP  Rimegepant Sulfate (NURTEC) 75 MG TBDP Take 75 mg by mouth daily as needed (Migraine).     [provider]  rosuvastatin (CRESTOR) 10 MG tablet Take 10 mg by mouth daily.  02/28/18   [provider]  sitaGLIPtin (JANUVIA) 100 MG tablet Take 1 tablet (100 mg total) by mouth daily. 10/19/21   Nicholas Lose, MD  Sodium Chloride Flush (NORMAL SALINE FLUSH) 0.9 % SOLN FLUSH WITH TWO 10 ML FLUSHES IN PORT BEFORE AND AFTER FLUIDS AND MEDICATION ADMINISTRATION, TO MAINTAIN PATENCY--USE UP TO 4 TIMES DAILY. 10/30/21 10/30/22  Nicholas Lose, MD  sucralfate (CARAFATE) 1 g tablet Take 1 g by mouth daily as needed (FOR ULCERS).     [provider]  SYNTHROID 100 MCG tablet Take 1 tablet (100 mcg total) by mouth every morning. 10/19/21   Nicholas Lose, MD  topiramate (TOPAMAX) 50 MG tablet Take 150 mg by mouth at bedtime.  12/25/19   [provider]  zolpidem (AMBIEN CR) 12.5 MG CR tablet Take 12.5 mg by mouth at bedtime. 03/05/22   [provider]    Physical Exam: Vitals:   07/01/22 1340 07/01/22 1511 07/01/22 1523 07/01/22 1650  BP: (!) 154/121 (!) 90/58  109/60  Pulse: (!) 109 (!) 108  (!) 106  Resp: _0 Temp:   98.8 F (37.1 C) 98.2 F (36.8 C)  TempSrc:   Oral Oral  SpO2: 100% 97%  100%    Constitutional: Looks anxious but comfortable.  Walking back from the bathroom.  On room air and was out of breath.  Patient herself put her back on oxygen. Vitals:   07/01/22 1340 07/01/22 1511 07/01/22 1523 07/01/22 1650  BP: (!) 154/121 (!) 90/58  109/60  Pulse: (!) 109 (!) 108  (!) 106   Resp: _1 Temp:   98.8 F (37.1 C) 98.2 F (36.8 C)  TempSrc:   Oral Oral  SpO2: 100% 97%  100%   Eyes: PERRL, lids and conjunctivae normal, no ulcers.  No thrush. ENMT: Mucous membranes are dry.  Posterior pharynx clear of any exudate or lesions.Normal dentition.  Neck: normal, supple, no masses, no thyromegaly Respiratory: clear to auscultation bilaterally, no wheezing, no crackles. Normal respiratory effort. No accessory muscle use.  No added sounds. Cardiovascular: Regular rate and rhythm, no murmurs / rubs / gallops.  Anasarca.  1+ edema all over.  Punctate lesions on the hands and legs but not obviously infected.  2+ pedal pulses. No carotid bruits.  Abdomen: no tenderness, no masses palpated. No hepatosplenomegaly. Bowel sounds positive.  Musculoskeletal: no clubbing / cyanosis. No joint deformity upper and lower extremities. Good ROM, no contractures. Normal muscle tone.  Skin: no rashes, lesions, ulcers. No induration Neurologic: CN 2-12 grossly intact. Sensation intact, DTR normal. Strength 5/5 in all 4.  Psychiatric: Normal judgment and insight. Alert and oriented x 3.  Anxious.    Labs on Admission: I have personally reviewed following labs and imaging studies  CBC: Recent Labs  Lab 07/01/22 1336  WBC 16.5*  NEUTROABS 14.1*  HGB 9.8*  HCT 32.2*  MCV 80.7  PLT 412   Basic Metabolic Panel: Recent Labs  Lab 07/01/22 1336  NA 139  K 3.0*  CL 100  CO2 26  GLUCOSE 138*  BUN 25*  CREATININE 1.55*  CALCIUM 8.9   GFR: CrCl cannot be calculated (Unknown ideal weight.). Liver Function Tests: Recent Labs  Lab 07/01/22 1336  AST 32  ALT 29  ALKPHOS 70  BILITOT 0.7  PROT 7.6  ALBUMIN 4.0   Recent Labs  Lab 07/01/22 1336  LIPASE 44   No results for input(s): "AMMONIA" in the last 168 hours. Coagulation Profile: No results for input(s): "INR", "PROTIME" in the last 168 hours. Cardiac Enzymes: No results for input(s): "CKTOTAL", "CKMB",  "CKMBINDEX", "TROPONINI" in the last 168 hours. BNP (last 3 results) No results for input(s): "PROBNP" in the last 8760 hours. HbA1C: No results for input(s): "HGBA1C" in the last 72 hours. CBG: No results for input(s): "GLUCAP" in the last 168 hours. Lipid Profile: No results for input(s): "CHOL", "HDL", "LDLCALC", "TRIG", "CHOLHDL", "LDLDIRECT" in the last 72 hours. Thyroid Function Tests: No results for input(s): "TSH", "T4TOTAL", "FREET4", "T3FREE", "THYROIDAB" in the last 72 hours. Anemia Panel: No results for input(s): "VITAMINB12", "FOLATE", "FERRITIN", "TIBC", "IRON", "RETICCTPCT" in the last 72 hours. Urine analysis:    Component Value Date/Time   COLORURINE YELLOW 04/05/2022 1842   APPEARANCEUR CLEAR 04/05/2022 1842   LABSPEC 1.018 04/05/2022 1842   PHURINE 5.0 04/05/2022 1842   GLUCOSEU NEGATIVE 04/05/2022 1842   HGBUR NEGATIVE 04/05/2022 1842   BILIRUBINUR NEGATIVE 04/05/2022 1842   KETONESUR NEGATIVE 04/05/2022 1842   PROTEINUR 30 (A) 04/05/2022 1842   UROBILINOGEN 1.0 06/16/2015 1045   NITRITE NEGATIVE 04/05/2022 1842   LEUKOCYTESUR NEGATIVE 04/05/2022 1842    Radiological Exams on Admission: DG Chest 2 View  Result Date: 07/01/2022 CLINICAL DATA:  Cough for 3 days. EXAM: CHEST - 2 VIEW COMPARISON:  04/05/2022.  CT, 04/04/2020. FINDINGS: Cardiac silhouette is normal in size and configuration. Normal mediastinal and hilar contours. Streaky and patchy opacity in the left lower lobe. Remainder of the lungs is clear. No pleural effusion or pneumothorax. Skeletal structures are intact.  Previous breast surgery. IMPRESSION: 1. Left lower lobe opacity consistent with pneumonia. Electronically Signed   By: Lajean Manes M.D.   On: 07/01/2022 11:50      Assessment/Plan Principal Problem:   Sepsis ([place]) Active Problems:   Hx of Hodgkins lymphoma   Adrenal insufficiency (HCC)   Hypertension   Raynaud phenomenon   GERD (gastroesophageal reflux disease)    Hyperlipidemia   Chronic respiratory failure with hypoxia ([place])   AKI (acute kidney injury) ([place])     1.  Sepsis present on admission secondary to left lower lobe pneumonia in a patient with immunocompromise status: Hypoxemia, lactic acidosis, WBC count 16,000. Recent Enterobacter aeruginosa bacteremia associated with port that is removed now. Agree with admission given severity of symptoms. Antibiotics to treat bacterial pneumonia, depending upon previous sensitivity and as per ID recommendation patient will be continuing on cefepime and azithromycin. Chest physiotherapy, incentive spirometry, deep breathing exercises, sputum induction, mucolytic's and bronchodilators. Sputum cultures, blood cultures, Legionella and streptococcal antigen. Supplemental oxygen to keep saturations more than 90%.  Continue mobility.  2.  Acute renal failure: Patient received 3 L of IV fluids in the ER.  She has developed anasarca.  Blood pressures are adequate.  We will hold off on further IV fluids and encourage oral intake.  Recheck levels tomorrow morning.  3.  Chronic hypoxemic respiratory failure: As above.  On 3 to 4 L of oxygen and maintaining saturations.  4.  Chronic diastolic heart failure: Hold beta-blockers tonight.  Hold diuretics.  If adequate improvement, she will go back on diuretics to improve her anasarca.  5.  Addison's disease with adrenal insufficiency: Patient usually on Solu-Cortef 20/10 mg at home as maintenance. Will put patient on stress dose of Solu-Medrol 100 mg twice daily until clinical improvement. Replace thyroid at home doses.  6.  Hypokalemia, replace.  We will recheck in the morning including magnesium and phosphorus.  7.  Anxiety and major depressive disorder and insomnia: On multiple medications including Klonopin, Wellbutrin, Marinol, Lexapro, Lamictal, Ambien at night.  Resume as needed.  8.  Hyperglycemia associated with steroid use: Cover with sliding scale  insulin.   DVT prophylaxis: Lovenox subcu Code Status: DNR with full scope of treatment Family Communication: Husband at the bedside Disposition Plan: Home when clinically improved Consults called: ID, curbside by ER Admission status: Inpatient due to sepsis, telemetry to monitor medications.   Barb Merino MD [facility] Pager [phone]

## 2022-07-02 ENCOUNTER — Inpatient Hospital Stay (HOSPITAL_COMMUNITY): Payer: BC Managed Care – PPO

## 2022-07-02 DIAGNOSIS — N179 Acute kidney failure, unspecified: Secondary | ICD-10-CM | POA: Diagnosis not present

## 2022-07-02 DIAGNOSIS — K2210 Ulcer of esophagus without bleeding: Secondary | ICD-10-CM

## 2022-07-02 DIAGNOSIS — I890 Lymphedema, not elsewhere classified: Secondary | ICD-10-CM

## 2022-07-02 DIAGNOSIS — I776 Arteritis, unspecified: Secondary | ICD-10-CM

## 2022-07-02 DIAGNOSIS — R6520 Severe sepsis without septic shock: Secondary | ICD-10-CM | POA: Diagnosis not present

## 2022-07-02 DIAGNOSIS — E782 Mixed hyperlipidemia: Secondary | ICD-10-CM

## 2022-07-02 DIAGNOSIS — Z8571 Personal history of Hodgkin lymphoma: Secondary | ICD-10-CM

## 2022-07-02 DIAGNOSIS — J189 Pneumonia, unspecified organism: Secondary | ICD-10-CM | POA: Diagnosis not present

## 2022-07-02 DIAGNOSIS — A419 Sepsis, unspecified organism: Secondary | ICD-10-CM | POA: Diagnosis not present

## 2022-07-02 DIAGNOSIS — I878 Other specified disorders of veins: Secondary | ICD-10-CM

## 2022-07-02 DIAGNOSIS — E2740 Unspecified adrenocortical insufficiency: Secondary | ICD-10-CM | POA: Diagnosis not present

## 2022-07-02 DIAGNOSIS — I7300 Raynaud's syndrome without gangrene: Secondary | ICD-10-CM

## 2022-07-02 LAB — GLUCOSE, CAPILLARY
Glucose-Capillary: 187 mg/dL — ABNORMAL HIGH (ref 70–99)
Glucose-Capillary: 190 mg/dL — ABNORMAL HIGH (ref 70–99)
Glucose-Capillary: 186 mg/dL — ABNORMAL HIGH (ref 70–99)

## 2022-07-02 LAB — BLOOD GAS, ARTERIAL
Acid-base deficit: 2.9 mmol/L — ABNORMAL HIGH (ref 0.0–2.0)
Bicarbonate: 22.6 mmol/L (ref 20.0–28.0)
O2 Saturation: 100 %
Patient temperature: 36.6
pCO2 arterial: 40 mmHg (ref 32–48)
pH, Arterial: 7.36 (ref 7.35–7.45)
pO2, Arterial: 104 mmHg (ref 83–108)

## 2022-07-02 MED ORDER — LORAZEPAM 2 MG/ML IJ SOLN
1.0000 mg | Freq: Once | INTRAMUSCULAR | Status: AC
  Administered 2022-07-02: 1 mg via INTRAVENOUS

## 2022-07-02 MED ORDER — SODIUM CHLORIDE 0.9 % IV SOLN
2.0000 g | INTRAVENOUS | Status: DC
  Administered 2022-07-02: 2 g via INTRAVENOUS
  Filled 2022-07-02: qty 20

## 2022-07-02 MED ORDER — LORAZEPAM 2 MG/ML IJ SOLN
1.0000 mg | INTRAMUSCULAR | Status: DC | PRN
  Administered 2022-07-03: 1 mg via INTRAVENOUS
  Filled 2022-07-02 (×2): qty 1

## 2022-07-02 MED ORDER — LORAZEPAM 2 MG/ML IJ SOLN
INTRAMUSCULAR | Status: AC
  Filled 2022-07-02: qty 1

## 2022-07-02 MED ORDER — IPRATROPIUM BROMIDE 0.02 % IN SOLN: 0.5000 mg | RESPIRATORY_TRACT | Status: DC | PRN

## 2022-07-02 MED ORDER — ALBUTEROL SULFATE (2.5 MG/3ML) 0.083% IN NEBU
2.5000 mg | INHALATION_SOLUTION | RESPIRATORY_TRACT | Status: DC | PRN
  Administered 2022-07-03 (×2): 2.5 mg via RESPIRATORY_TRACT
  Filled 2022-07-02: qty 3

## 2022-07-02 MED ORDER — IPRATROPIUM BROMIDE 0.02 % IN SOLN: 0.5000 mg | Freq: Four times a day (QID) | RESPIRATORY_TRACT | Status: DC | PRN

## 2022-07-02 MED ORDER — IPRATROPIUM BROMIDE 0.02 % IN SOLN
RESPIRATORY_TRACT | Status: AC
  Filled 2022-07-02: qty 2.5

## 2022-07-02 MED ORDER — GUAIFENESIN-DM 100-10 MG/5ML PO SYRP
5.0000 mL | ORAL_SOLUTION | ORAL | Status: DC | PRN
  Administered 2022-07-03 – 2022-07-06 (×3): 5 mL via ORAL
  Filled 2022-07-02 (×3): qty 10

## 2022-07-02 MED ORDER — ALBUTEROL SULFATE (2.5 MG/3ML) 0.083% IN NEBU
2.5000 mg | INHALATION_SOLUTION | RESPIRATORY_TRACT | Status: DC
  Administered 2022-07-03 (×2): 2.5 mg via RESPIRATORY_TRACT
  Filled 2022-07-02 (×3): qty 3

## 2022-07-02 MED ORDER — GUAIFENESIN 100 MG/5ML PO LIQD: 5.0000 mL | ORAL | Status: DC | PRN

## 2022-07-02 MED ORDER — AZITHROMYCIN 250 MG PO TABS: 500.0000 mg | ORAL_TABLET | Freq: Every day | ORAL | Status: DC

## 2022-07-02 MED ORDER — ALBUTEROL SULFATE (2.5 MG/3ML) 0.083% IN NEBU
5.0000 mg | INHALATION_SOLUTION | Freq: Once | RESPIRATORY_TRACT | Status: AC
  Administered 2022-07-02: 5 mg via RESPIRATORY_TRACT

## 2022-07-02 NOTE — Progress Notes (Signed)
Mobility Specialist - Progress Note   07/02/22 1250  Mobility  Activity Ambulated with assistance in hallway  Activity Response Tolerated well  Distance Ambulated (ft) 1250 ft  $Mobility charge 1 Mobility  Level of Assistance Modified independent, requires aide device or extra time  Assistive Device None  Mobility Referral Yes   Pt received in bed and agreed to mobility, no c/o pain nor discomfort. Pt back to bed with all needs met and family in room.  Roderick Pee Mobility Specialist

## 2022-07-02 NOTE — Progress Notes (Signed)
At 1706 this nurse received a call from secretary stating patient could not breath. This nurse walking into the room to find patient standing in the doorway stating she could not breath. Reports that she woke from a nap not able to breath, with thoughts that she was throwing a clot from her lung and become extremely scared. This nurse went to get charge nurse Harvest Dark RN, Barb Merino MD was paged, rapid response was called. Placed a NRB on patient and gave breathing treatment, IV solumedrol, ativan, and Clonazepam. Breath sounds with Rhonchi through out lung fields.ABG and chest xray obtained. Patient now stabilized and will be transferring off the unit.

## 2022-07-02 NOTE — Progress Notes (Signed)
Mobility Specialist - Progress Note   07/02/22 0923  Mobility  Activity Ambulated with assistance in hallway  Activity Response Tolerated well  Distance Ambulated (ft) 1000 ft  $Mobility charge 1 Mobility  Level of Assistance Modified independent, requires aide device or extra time  Assistive Device None  Mobility Referral Yes   Pt received in bed and agreed for mobility, no c/o pain nor discomfort. Pt requested to ambulate again in afternoon, and returned to bed with all needs met and family in room.  Roderick Pee Mobility Specialist

## 2022-07-02 NOTE — Plan of Care (Signed)
Responded to rapid response on Ms. Rollene Rotunda.  She was laying in bed with perceived anxiety/panic due to dyspnea and SOB. She awoke from a nap feeling dyspneic and could not catch her breath and had associated coughing. Multiple staff present in room.  Tele reviewed on monitor in room, no acute issues.  SpO2 upper 90s on 2L. Hemodynamically stable.  CXR reviewed personally after capture: improved airspace opacities bilaterally; no appreciated infiltrate, effusion, nor PNX. Overall good aeration  On exam has diffuse rhonchi and some wheezing; anterior sounds more tight ABG: 7.36/40/104  Plan: - transfer to progressive care in case of need for BIPAP use for WOB/panic; ABG reassuring though - given ativan 1 mg x 1 during rapid - continue ativan 1 mg q4PRN x 3 doses if needed - adding atrovent neb; if helps can change to duonebs - contact primary team with any further issues   Dwyane Dee, MD [facility] 07/02/2022, 6:11 PM

## 2022-07-02 NOTE — Progress Notes (Signed)
  Transition of Care [facility]) Screening Note   Patient Details  Name: DONAE KUEKER Date of Birth: 09-22-1971   Transition of Care [facility]) CM/SW Contact:    Vassie Moselle, LCSW Phone Number: 07/02/2022, 1:15 PM    Transition of Care Department [facility]) has reviewed patient and no TOC needs have been identified at this time. We will continue to monitor patient advancement through interdisciplinary progression rounds. If new patient transition needs arise, please place a TOC consult.

## 2022-07-02 NOTE — Plan of Care (Signed)
Problem: Education: Goal: Ability to describe self-care measures that may prevent or decrease complications (Diabetes Survival Skills Education) will improve Outcome: Progressing   Problem: Coping: Goal: Ability to adjust to condition or change in health will improve Outcome: Progressing   Problem: Clinical Measurements: Goal: Ability to maintain clinical measurements within normal limits will improve Outcome: [place], RN 07/02/22 9:19 AM

## 2022-07-02 NOTE — Progress Notes (Signed)
Pt refusing AM labs. MD notified and made aware. Plan of care ongoing.

## 2022-07-02 NOTE — Progress Notes (Signed)
PROGRESS NOTE    [name]  YKD:[id number] DOB: [birth year] DOA: 07/01/2022 PCP: Su Grand, MD    Brief Narrative:  51 year old with history of extensive multiple cancers, chronic hypoxemia on 4 L oxygen, Addison's disease, GERD, migraine, adrenal insufficiency, hypothyroidism anxiety depression chronic pain syndrome and recurrent admissions, recent bacteremia presented with feeling weak fatigue and fever at home, shortness of breath not improved with inhalers at home.  In the emergency room temperature 102.3, WBC count 16.5, blood pressures 90/58, lactic acid 3.  Chest x-ray with left lower lobe infiltrate.  Admitted and treated as sepsis due to pneumonia.   Assessment & Plan:   Sepsis present on admission secondary to left lower lobe pneumonia, immunocompromised patient.  Hypoxemia, lactic acidosis and leukocytosis. Antibiotics to treat bacterial pneumonia, currently on cefepime and azithromycin. Chest physiotherapy, incentive spirometry, deep breathing exercises, sputum induction, mucolytic's and bronchodilators. Sputum cultures, blood cultures, Legionella and streptococcal antigen pending and negative so far. Supplemental oxygen to keep saturations more than 90%.  Continue mobility. Extensive infectious history, will involve ID team.  Acute renal failure: Treated with IV fluids.  Hemodynamics improved.  Repeat labs.  Recheck tomorrow morning.  We will recheck renal functions before starting diuretics.  Chronic hypoxemic respiratory failure: Continuing saturations on 3 to 4 L oxygen.  Current diastolic heart failure pulm beta-blockers diuretics on hold.  She does have anasarca.  Do not have new lab test, repeat renal functions tomorrow before challenging her back with diuretics.  Adrenal insufficiency: Currently on a stress dose of steroids.  Anxiety depression, insomnia.  Multiple pain issues and nausea. Symptomatic treatment. Resume Klonopin, Wellbutrin, Marinol, Lexapro,  Lamictal and Ambien.  Hyperglycemia associated with steroid use: Cover with sliding scale insulin.  Patient on Januvia at home.  Hypokalemia: Continue potassium replacement.  Will need labs in the morning.   DVT prophylaxis: enoxaparin (LOVENOX) injection 40 mg Start: 07/01/22 2200   Code Status: DNR with full scope of treatment Family Communication: Husband at the bedside Disposition Plan: Status is: Inpatient Remains inpatient appropriate because: Significant infection, IV antibiotics     Consultants:  Infectious disease  Procedures:  None  Antimicrobials:  Cefepime and azithromycin 10/1---   Subjective: Patient seen and examined.  Multiple complaints.  Tearful and anxious.  Getting around with minimal oxygen.  Complains of chest tightness and difficulty breathing, ongoing dry cough.  Fever 102 overnight.  Husband at the bedside. Complains of having difficulty with swallowing medications and also difficulties maintaining IV lines.  He is to have a port that was taken out. Declined to have phlebotomist draw labs today because she has run out of the veins.  We discussed about doing this tomorrow morning.  Objective: Vitals:   07/02/22 0125 07/02/22 0149 07/02/22 0527 07/02/22 0729  BP: 124/69  116/66   Pulse: (!) 101  (!) 104   Resp: 16  16   Temp: 97.8 F (36.6 C)  98.1 F (36.7 C)   TempSrc: Oral  Oral   SpO2: 100% 100% 99% 94%  Weight:      Height:        Intake/Output Summary (Last 24 hours) at 07/02/2022 1221 Last data filed at 07/01/2022 1900 Gross per 24 hour  Intake 480 ml  Output 1 ml  Net 479 ml   Filed Weights   07/01/22 1722  Weight: 82.2 kg    Examination:  General exam: Appears anxious, tearful, frustrated.  Looks comfortable otherwise. Respiratory system: Poor lateral air entry.  Not  in any distress.  Dry nagging cough. Cardiovascular system: S1 & S2 heard, RRR.  Anasarca. Gastrointestinal system: Abdomen is nondistended, soft and  nontender. No organomegaly or masses felt. Normal bowel sounds heard. Central nervous system: Alert and oriented. No focal neurological deficits. Extremities: Symmetric 5 x 5 power. Skin: 2 punctate lesions on hands and legs.    Data Reviewed: I have personally reviewed following labs and imaging studies  CBC: Recent Labs  Lab 07/01/22 1336  WBC 16.5*  NEUTROABS 14.1*  HGB 9.8*  HCT 32.2*  MCV 80.7  PLT 211   Basic Metabolic Panel: Recent Labs  Lab 07/01/22 1336  NA 139  K 3.0*  CL 100  CO2 26  GLUCOSE 138*  BUN 25*  CREATININE 1.55*  CALCIUM 8.9   GFR: Estimated Creatinine Clearance: 45.5 mL/min (A) (by C-G formula based on SCr of 1.55 mg/dL (H)). Liver Function Tests: Recent Labs  Lab 07/01/22 1336  AST 32  ALT 29  ALKPHOS 70  BILITOT 0.7  PROT 7.6  ALBUMIN 4.0   Recent Labs  Lab 07/01/22 1336  LIPASE 44   No results for input(s): "AMMONIA" in the last 168 hours. Coagulation Profile: No results for input(s): "INR", "PROTIME" in the last 168 hours. Cardiac Enzymes: No results for input(s): "CKTOTAL", "CKMB", "CKMBINDEX", "TROPONINI" in the last 168 hours. BNP (last 3 results) No results for input(s): "PROBNP" in the last 8760 hours. HbA1C: No results for input(s): "HGBA1C" in the last 72 hours. CBG: No results for input(s): "GLUCAP" in the last 168 hours. Lipid Profile: No results for input(s): "CHOL", "HDL", "LDLCALC", "TRIG", "CHOLHDL", "LDLDIRECT" in the last 72 hours. Thyroid Function Tests: No results for input(s): "TSH", "T4TOTAL", "FREET4", "T3FREE", "THYROIDAB" in the last 72 hours. Anemia Panel: No results for input(s): "VITAMINB12", "FOLATE", "FERRITIN", "TIBC", "IRON", "RETICCTPCT" in the last 72 hours. Sepsis Labs: Recent Labs  Lab 07/01/22 1512  LATICACIDVEN 3.0*    Recent Results (from the past 240 hour(s))  Culture, blood (routine x 2)     Status: None (Preliminary result)   Collection Time: 07/01/22 12:57 PM   Specimen:  BLOOD  Result Value Ref Range Status   Specimen Description   Final    BLOOD RIGHT ANTECUBITAL Performed at [facility] [address] [place], [place] [address]    Special Requests   Final    BOTTLES DRAWN AEROBIC ONLY Blood Culture adequate volume Performed at [facility] [address], [place], [place] [address]    Culture   Final    NO GROWTH < 24 HOURS Performed at [facility] [address] [place], [place] [address]    Report Status PENDING  Incomplete  Culture, blood (routine x 2)     Status: None (Preliminary result)   Collection Time: 07/01/22 12:57 PM   Specimen: BLOOD  Result Value Ref Range Status   Specimen Description   Final    BLOOD LEFT ANTECUBITAL Performed at [facility] [address] [place], [place] [address]    Special Requests   Final    BOTTLES DRAWN AEROBIC AND ANAEROBIC Blood Culture adequate volume Performed at [facility] [address], [place], [place] [address]    Culture   Final    NO GROWTH < 24 HOURS Performed at [facility] [address], [place], [place] [address]    Report Status PENDING  Incomplete  Resp Panel by RT-PCR (Flu A&B, Covid) Peripheral  Status: None   Collection Time: 07/01/22  1:39 PM   Specimen: Peripheral; Nasal Swab  Result Value Ref Range Status   SARS Coronavirus 2 by RT PCR NEGATIVE NEGATIVE Final    Comment: (NOTE) SARS-CoV-2 target nucleic acids are NOT DETECTED.  The SARS-CoV-2 RNA is generally detectable in upper respiratory specimens during the acute phase of infection. The lowest concentration of SARS-CoV-2 viral copies this assay can detect is 138 copies/mL. A negative result does not preclude SARS-Cov-2 infection and should not be used as the sole basis for treatment or other patient management decisions. A negative result may occur with  improper specimen collection/handling, submission of specimen  other than nasopharyngeal swab, presence of viral mutation(s) within the areas targeted by this assay, and inadequate number of viral copies(<138 copies/mL). A negative result must be combined with clinical observations, patient history, and epidemiological information. The expected result is Negative.  Fact Sheet for Patients:  EntrepreneurPulse.com.au  Fact Sheet for Healthcare Providers:  IncredibleEmployment.be  This test is no t yet approved or cleared by the [place] FDA and  has been authorized for detection and/or diagnosis of SARS-CoV-2 by FDA under an Emergency Use Authorization (EUA). This EUA will remain  in effect (meaning this test can be used) for the duration of the COVID-19 declaration under Section 564(b)(1) of the Act, 21 U.S.C.section 360bbb-3(b)(1), unless the authorization is terminated  or revoked sooner.       Influenza A by PCR NEGATIVE NEGATIVE Final   Influenza B by PCR NEGATIVE NEGATIVE Final    Comment: (NOTE) The Xpert Xpress SARS-CoV-2/FLU/RSV plus assay is intended as an aid in the diagnosis of influenza from Nasopharyngeal swab specimens and should not be used as a sole basis for treatment. Nasal washings and aspirates are unacceptable for Xpert Xpress SARS-CoV-2/FLU/RSV testing.  Fact Sheet for Patients: EntrepreneurPulse.com.au  Fact Sheet for Healthcare Providers: IncredibleEmployment.be  This test is not yet approved or cleared by the [place] FDA and has been authorized for detection and/or diagnosis of SARS-CoV-2 by FDA under an Emergency Use Authorization (EUA). This EUA will remain in effect (meaning this test can be used) for the duration of the COVID-19 declaration under Section 564(b)(1) of the Act, 21 U.S.C. section 360bbb-3(b)(1), unless the authorization is terminated or revoked.  Performed at [facility], [place] [address] [place], [place] [address]          Radiology Studies: DG Chest 2 View  Result Date: 07/01/2022 CLINICAL DATA:  Cough for 3 days. EXAM: CHEST - 2 VIEW COMPARISON:  04/05/2022.  CT, 04/04/2020. FINDINGS: Cardiac silhouette is normal in size and configuration. Normal mediastinal and hilar contours. Streaky and patchy opacity in the left lower lobe. Remainder of the lungs is clear. No pleural effusion or pneumothorax. Skeletal structures are intact.  Previous breast surgery. IMPRESSION: 1. Left lower lobe opacity consistent with pneumonia. Electronically Signed   By: Lajean Manes M.D.   On: 07/01/2022 11:50        Scheduled Meds:  [START ON 07/03/2022] azithromycin  500 mg Oral Daily   buPROPion  150 mg Oral Daily   enoxaparin (LOVENOX) injection  40 mg Subcutaneous Q24H   escitalopram  20 mg Oral QPM   fluconazole  150 mg Oral Once   guaiFENesin  600 mg Oral BID   insulin aspart  0-5 Units Subcutaneous QHS   insulin aspart  0-9 Units Subcutaneous TID WC   lamoTRIgine  150 mg Oral QHS   levothyroxine  100 mcg Oral q morning   methylPREDNISolone (SOLU-MEDROL) injection  100 mg Intravenous Q12H   mometasone-formoterol  2 puff Inhalation BID   pantoprazole  40 mg Oral Daily   potassium chloride  40 mEq Oral Daily   topiramate  150 mg Oral QHS   Continuous Infusions:  cefTRIAXone (ROCEPHIN)  IV       LOS: 1 day    Time spent: 35 minutes    Barb Merino, MD [facility] Pager [phone]

## 2022-07-02 NOTE — Significant Event (Signed)
Rapid Response Event Note   Reason for Call :  Short of breath   Initial Focused Assessment:  Patient sitting on side of bed, appears to be having an anxiety attack. Reports that she woke from a nap not able to breath, with thoughts that she was throwing a clot from her lung and become extremely scared. Staff placed a NRB on patient and gave breathing treatment. Breath sounds with Rhonchi through out lung fields.  Interventions:  ABG CXR PRN Klonopin Ativan PRN    Plan of Care:  Transfer to Progressive bed.    MD Notified:  Dr. Sloan Leiter, Dr. Darrell Jewel Call Time: 6144 Arrival Time: 3154  End Time: [place] Jackquline Branca, RN

## 2022-07-02 NOTE — Consult Note (Signed)
Date of Admission:  07/01/2022          Reason for Consult: Left lower lobe pneumonia    Referring Provider: Barb Merino, MD   Assessment:  LLL pneumonia Recurrent bacteremias in the context of port that was finally removed Addison's disease with adrenal insufficienc thyroid cancer cervical cancer Melanoma hodgkins lymphoma breast ductal carcinoma Chronic lymphedema upper extremities COPD on 3-4L via [place] at home "Vasculitis" Esophageal ulcers of unknown cause Poor venous access "Vasculitis"  Plan:  While she has IV access would continue ceftriaxone but I dont think she needs azithromycin if difficult fo her to take Will likely switch her to augmentin which we can give as a liquid orally to accomodate her dysphagia She needs her dysphagia worked up  Principal Problem:   Sepsis ([place]) Active Problems:   Hx of Hodgkins lymphoma   Adrenal insufficiency (HCC)   Hypertension   Raynaud phenomenon   GERD (gastroesophageal reflux disease)   Hyperlipidemia   Chronic respiratory failure with hypoxia (HCC)   AKI (acute kidney injury) ([place])   Scheduled Meds:  [START ON 07/03/2022] azithromycin  500 mg Oral Daily   buPROPion  150 mg Oral Daily   enoxaparin (LOVENOX) injection  40 mg Subcutaneous Q24H   escitalopram  20 mg Oral QPM   fluconazole  150 mg Oral Once   guaiFENesin  600 mg Oral BID   insulin aspart  0-5 Units Subcutaneous QHS   insulin aspart  0-9 Units Subcutaneous TID WC   lamoTRIgine  150 mg Oral QHS   levothyroxine  100 mcg Oral q morning   methylPREDNISolone (SOLU-MEDROL) injection  100 mg Intravenous Q12H   mometasone-formoterol  2 puff Inhalation BID   pantoprazole  40 mg Oral Daily   potassium chloride  40 mEq Oral Daily   topiramate  150 mg Oral QHS   Continuous Infusions:  cefTRIAXone (ROCEPHIN)  IV     PRN Meds:.albuterol, clonazePAM, diphenhydrAMINE, dronabinol, HYDROmorphone (DILAUDID) injection, ibuprofen, Rimegepant Sulfate,  zolpidem  HPI: [name] is a 51 y.o. female with multiple cancers including thyroid cancer cervical cancer melanoma Hodgkin's lymphoma breast ductal carcinoma who has been in remission has limited venous access and chronic lymphedema in the upper extremities.  She been seen on multiple occasions by her group and had suffered from recurrent bacteremias to her port removal.  She currently is admitted with symptoms taking her rigors chills and subjective fever though temperature was not elevated initially.  She was also developing shortness of breath and a cough.  She noticed her oxygen saturations going down to the 70s despite supplemental oxygen she called the on-call pulmonary physician who recommended that she come to the ER for admission.  In the ER overall 102.3 with a white count of 16,500 blood pressure in the 90s over 50s with lactic acid of 3 and a creatinine that got up to 1.55 chest x-ray showed left lower lobe consolidation.  She was given stress dose steroids and started on cefepime and azithromycin.  Blood cultures were taken initially which so far failed to give an organism.  She has trouble with taking pills due to esophageal ulcers that are not yet worked up and treated.  She says that she has a vasculitis as well but that it does not respond to steroids which would seem curious to me.  I feel comfortable changing her over to ceftriaxone IV and azithromycin can be continued if she can tolerate it.  Otherwise I do not  feel strongly about her needing azithromycin.  I think that if she is improving we can switch her over to oral pills that are either crushable or available in liquid such as potentially liquid Augmentin.  I spent 84 minutes with the patient including than 50% of the time in face to face counseling of the patient guarding her pneumonia and her prior bacteremias personally reviewing CXR along with review of medical records in preparation for the visit and during  the visit and in coordination of her care.    Review of Systems: Review of Systems  Constitutional:  Positive for chills, diaphoresis, fever and malaise/fatigue. Negative for weight loss.  HENT:  Negative for congestion and sore throat.   Eyes:  Negative for blurred vision and photophobia.  Respiratory:  Positive for shortness of breath. Negative for cough and wheezing.   Cardiovascular:  Negative for chest pain, palpitations and leg swelling.  Gastrointestinal:  Negative for abdominal pain, blood in stool, constipation, diarrhea, heartburn, melena, nausea and vomiting.  Genitourinary:  Negative for dysuria, flank pain and hematuria.  Musculoskeletal:  Negative for back pain, falls, joint pain and myalgias.  Skin:  Negative for itching and rash.  Neurological:  Negative for dizziness, focal weakness, loss of consciousness, weakness and headaches.  Endo/Heme/Allergies:  Does not bruise/bleed easily.  Psychiatric/Behavioral:  Negative for depression and suicidal ideas. The patient does not have insomnia.     Past Medical History:  Diagnosis Date   Addison's disease ([place])    Adrenal insufficiency ([place])    Anemia    Anxiety    Aortic stenosis    Aortic stenosis    Appendicitis 12/19/09   Appendicitis    Breast cancer (HCC)    STATUS POST BILATERAL MASTECTOMY. STATUS POST RECONSTRUCTION. 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   Chronic respiratory failure with hypoxia (HCC) 12/23/2018   Cough variant asthma 04/13/2019   Depression    GERD (gastroesophageal reflux disease)    takes Dexilant and carafate and gi coctail    Headache    migraines on a daily and monthly regimen    Heart murmur    History of kidney stones    Hodgkin lymphoma ([place])    STATUS POST MANTLE RADIATION   Hodgkin's lymphoma ([place])     1987   Hypertension    Hypoxia    Necrotizing fasciitis ([place]) 12/23/2018   Non-ischemic cardiomyopathy ([place])    Osteoporosis    Palpitations    Pituitary adenoma ([place]) 12/23/2018   Pneumonia    PONV (postoperative nausea and vomiting)    Pre-diabetes    per pt; no meds   Pulmonary hypertension ([place]) 12/23/2018   Raynaud phenomenon    Right heart failure ([place]) 04/17/2017   Seizures ([place])    last febrile seizure was approx 3 weeks ago per report on 12/01/2020   Sleep apnea    upcoming sleep study per pt    Supplemental oxygen dependent    3 liters   SVT (supraventricular tachycardia)    Tachycardia    Thyroid cancer ([place])    STATUS POST SURGICAL REMOVAL-CURRENT ON THYROID REPLACEMENT    Social History   Tobacco Use   Smoking status: Never   Smokeless tobacco: Never  Vaping Use   Vaping Use: Never used  Substance Use Topics  Alcohol use: Not Currently    Comment: social    Drug use: No    Family History  Family history unknown: Yes   Allergies  Allergen Reactions   Ferrous Bisglycinate Chelate [Iron] Anaphylaxis and Other (See Comments)    Only IV - only FERRLICET   Mushroom Extract Complex Anaphylaxis   Na Ferric Gluc Cplx In Sucrose Anaphylaxis   Cymbalta [Duloxetine Hcl] Swelling and Anxiety   Ondansetron Hcl Other (See Comments)   Promethazine Other (See Comments)    Other reaction(s): Unknown   Promethazine Hcl     Other reaction(s): Unknown   Succinylcholine Other (See Comments)    Lock Jaw   Buprenorphine Hcl Hives   Compazine Other (See Comments)    Altered mental status Aggression   Metoclopramide Other (See Comments)    Dystonia   Morphine And Related Hives   Ondansetron Hives and Rash    Other reaction(s): Unknown Other reaction(s): Unknown   Promethazine Hcl Hives   Tegaderm Ag Mesh [Silver] Rash    Old formulation only, is able to tolerate new formulation    OBJECTIVE: Blood pressure 116/66, pulse (!) 104, temperature 98.1 F (36.7 C),  temperature source Oral, resp. rate 16, height _0  (1.651 m), weight 82.2 kg, SpO2 94 %.  Physical Exam Constitutional:      General: She is not in acute distress.    Appearance: Normal appearance. She is well-developed. She is not ill-appearing or diaphoretic.  HENT:     Head: Normocephalic and atraumatic.     Right Ear: Hearing and external ear normal.     Left Ear: Hearing and external ear normal.     Nose: No nasal deformity or rhinorrhea.  Eyes:     General: No scleral icterus.    Conjunctiva/sclera: Conjunctivae normal.     Right eye: Right conjunctiva is not injected.     Left eye: Left conjunctiva is not injected.     Pupils: Pupils are equal, round, and reactive to light.  Neck:     Vascular: No JVD.  Cardiovascular:     Rate and Rhythm: Normal rate and regular rhythm.     Heart sounds: Normal heart sounds, S1 normal and S2 normal. No murmur heard.    No friction rub. No gallop.  Pulmonary:     Effort: No respiratory distress.     Breath sounds: Normal breath sounds. No stridor. No wheezing or rhonchi.  Abdominal:     General: Bowel sounds are normal. There is no distension.     Palpations: Abdomen is soft.     Tenderness: There is no abdominal tenderness.  Musculoskeletal:        General: Normal range of motion.     Right shoulder: Normal.     Left shoulder: Normal.     Cervical back: Normal range of motion and neck supple.     Right hip: Normal.     Left hip: Normal.     Right knee: Normal.     Left knee: Normal.  Lymphadenopathy:     Head:     Right side of head: No submandibular, preauricular or posterior auricular adenopathy.     Left side of head: No submandibular, preauricular or posterior auricular adenopathy.     Cervical: No cervical adenopathy.     Right cervical: No superficial or deep cervical adenopathy.    Left cervical: No superficial or deep cervical adenopathy.  Skin:    General: Skin is warm and dry.  Coloration: Skin is not pale.      Findings: No abrasion, bruising, ecchymosis, erythema, lesion or rash.     Nails: There is no clubbing.  Neurological:     Mental Status: She is alert and oriented to person, place, and time.     Sensory: No sensory deficit.     Coordination: Coordination normal.     Gait: Gait normal.  Psychiatric:        Attention and Perception: Attention normal. She is attentive.        Mood and Affect: Mood is anxious.        Speech: Speech normal.        Behavior: Behavior normal. Behavior is cooperative.        Thought Content: Thought content normal.        Cognition and Memory: Cognition and memory normal.        Judgment: Judgment normal.    Areas of "vasculitis"  07/02/2022:       Lab Results Lab Results  Component Value Date   WBC 16.5 (H) 07/01/2022   HGB 9.8 (L) 07/01/2022   HCT 32.2 (L) 07/01/2022   MCV 80.7 07/01/2022   PLT 328 07/01/2022    Lab Results  Component Value Date   CREATININE 1.55 (H) 07/01/2022   BUN 25 (H) 07/01/2022   NA 139 07/01/2022   K 3.0 (L) 07/01/2022   CL 100 07/01/2022   CO2 26 07/01/2022    Lab Results  Component Value Date   ALT 29 07/01/2022   AST 32 07/01/2022   ALKPHOS 70 07/01/2022   BILITOT 0.7 07/01/2022     Microbiology: Recent Results (from the past 240 hour(s))  Culture, blood (routine x 2)     Status: None (Preliminary result)   Collection Time: 07/01/22 12:57 PM   Specimen: BLOOD  Result Value Ref Range Status   Specimen Description   Final    BLOOD RIGHT ANTECUBITAL Performed at [facility], [place] [address], [place], [place] [address]    Special Requests   Final    BOTTLES DRAWN AEROBIC ONLY Blood Culture adequate volume Performed at [facility] [address] [place], [place] [address]    Culture   Final    NO GROWTH < 24 HOURS Performed at [facility] [address], [place], [place] [address]    Report Status PENDING  Incomplete  Culture, blood (routine x 2)      Status: None (Preliminary result)   Collection Time: 07/01/22 12:57 PM   Specimen: BLOOD  Result Value Ref Range Status   Specimen Description   Final    BLOOD LEFT ANTECUBITAL Performed at [facility] [address], [place], [place] [address]    Special Requests   Final    BOTTLES DRAWN AEROBIC AND ANAEROBIC Blood Culture adequate volume Performed at [facility] [address] [place], [place] [address]    Culture   Final    NO GROWTH < 24 HOURS Performed at [facility] [address], [place], [place] [address]    Report Status PENDING  Incomplete  Resp Panel by RT-PCR (Flu A&B, Covid) Peripheral     Status: None   Collection Time: 07/01/22  1:39 PM   Specimen: Peripheral; Nasal Swab  Result Value Ref Range Status   SARS Coronavirus 2 by RT PCR NEGATIVE NEGATIVE Final    Comment: (NOTE) SARS-CoV-2 target nucleic acids are NOT DETECTED.  The SARS-CoV-2 RNA is generally detectable in upper respiratory specimens during the acute phase of infection. The lowest concentration of SARS-CoV-2 viral copies this assay can detect is 138 copies/mL. A negative result does not preclude SARS-Cov-2 infection and should not be used as the sole basis for treatment or other patient management decisions. A negative result may occur with  improper specimen collection/handling, submission of specimen other than nasopharyngeal swab, presence of viral mutation(s) within the areas targeted by this assay, and inadequate number of viral copies(<138 copies/mL). A negative result must be combined with clinical observations, patient history, and epidemiological information. The expected result is Negative.  Fact Sheet for Patients:  EntrepreneurPulse.com.au  Fact Sheet for Healthcare Providers:  IncredibleEmployment.be  This test is no t yet approved or cleared by the [place] FDA and  has been authorized  for detection and/or diagnosis of SARS-CoV-2 by FDA under an Emergency Use Authorization (EUA). This EUA will remain  in effect (meaning this test can be used) for the duration of the COVID-19 declaration under Section 564(b)(1) of the Act, 21 U.S.C.section 360bbb-3(b)(1), unless the authorization is terminated  or revoked sooner.       Influenza A by PCR NEGATIVE NEGATIVE Final   Influenza B by PCR NEGATIVE NEGATIVE Final    Comment: (NOTE) The Xpert Xpress SARS-CoV-2/FLU/RSV plus assay is intended as an aid in the diagnosis of influenza from Nasopharyngeal swab specimens and should not be used as a sole basis for treatment. Nasal washings and aspirates are unacceptable for Xpert Xpress SARS-CoV-2/FLU/RSV testing.  Fact Sheet for Patients: EntrepreneurPulse.com.au  Fact Sheet for Healthcare Providers: IncredibleEmployment.be  This test is not yet approved or cleared by the [place] FDA and has been authorized for detection and/or diagnosis of SARS-CoV-2 by FDA under an Emergency Use Authorization (EUA). This EUA will remain in effect (meaning this test can be used) for the duration of the COVID-19 declaration under Section 564(b)(1) of the Act, 21 U.S.C. section 360bbb-3(b)(1), unless the authorization is terminated or revoked.  Performed at [facility], [place] [address] [place], [place] [address]     Alcide Evener, [facility] [phone] pager  07/02/2022, 1:20 PM

## 2022-07-03 DIAGNOSIS — E2740 Unspecified adrenocortical insufficiency: Secondary | ICD-10-CM | POA: Diagnosis not present

## 2022-07-03 DIAGNOSIS — J189 Pneumonia, unspecified organism: Secondary | ICD-10-CM

## 2022-07-03 DIAGNOSIS — N179 Acute kidney failure, unspecified: Secondary | ICD-10-CM | POA: Diagnosis not present

## 2022-07-03 DIAGNOSIS — E271 Primary adrenocortical insufficiency: Secondary | ICD-10-CM

## 2022-07-03 DIAGNOSIS — J9611 Chronic respiratory failure with hypoxia: Secondary | ICD-10-CM | POA: Diagnosis not present

## 2022-07-03 DIAGNOSIS — K2100 Gastro-esophageal reflux disease with esophagitis, without bleeding: Secondary | ICD-10-CM

## 2022-07-03 DIAGNOSIS — R6520 Severe sepsis without septic shock: Secondary | ICD-10-CM | POA: Diagnosis not present

## 2022-07-03 DIAGNOSIS — F419 Anxiety disorder, unspecified: Secondary | ICD-10-CM

## 2022-07-03 DIAGNOSIS — A419 Sepsis, unspecified organism: Secondary | ICD-10-CM | POA: Diagnosis not present

## 2022-07-03 DIAGNOSIS — J4551 Severe persistent asthma with (acute) exacerbation: Secondary | ICD-10-CM

## 2022-07-03 LAB — CBC WITH DIFFERENTIAL/PLATELET
Abs Immature Granulocytes: 0.14 10*3/uL — ABNORMAL HIGH (ref 0.00–0.07)
Basophils Absolute: 0 10*3/uL (ref 0.0–0.1)
Basophils Relative: 0 %
Eosinophils Absolute: 0 10*3/uL (ref 0.0–0.5)
Eosinophils Relative: 0 %
HCT: 29.4 % — ABNORMAL LOW (ref 36.0–46.0)
Hemoglobin: 8.7 g/dL — ABNORMAL LOW (ref 12.0–15.0)
Immature Granulocytes: 1 %
Lymphocytes Relative: 5 %
Lymphs Abs: 1 10*3/uL (ref 0.7–4.0)
MCH: 24.5 pg — ABNORMAL LOW (ref 26.0–34.0)
MCHC: 29.6 g/dL — ABNORMAL LOW (ref 30.0–36.0)
MCV: 82.8 fL (ref 80.0–100.0)
Monocytes Absolute: 1.2 10*3/uL — ABNORMAL HIGH (ref 0.1–1.0)
Monocytes Relative: 7 %
Neutro Abs: 15.6 10*3/uL — ABNORMAL HIGH (ref 1.7–7.7)
Neutrophils Relative %: 87 %
Platelets: 328 10*3/uL (ref 150–400)
RBC: 3.55 MIL/uL — ABNORMAL LOW (ref 3.87–5.11)
RDW: 18.5 % — ABNORMAL HIGH (ref 11.5–15.5)
WBC: 17.8 10*3/uL — ABNORMAL HIGH (ref 4.0–10.5)
nRBC: 0 % (ref 0.0–0.2)

## 2022-07-03 LAB — EXPECTORATED SPUTUM ASSESSMENT W GRAM STAIN, RFLX TO RESP C

## 2022-07-03 LAB — MRSA NEXT GEN BY PCR, NASAL: MRSA by PCR Next Gen: NOT DETECTED

## 2022-07-03 LAB — MAGNESIUM: Magnesium: 2.3 mg/dL (ref 1.7–2.4)

## 2022-07-03 LAB — BASIC METABOLIC PANEL
Anion gap: 10 (ref 5–15)
BUN: 30 mg/dL — ABNORMAL HIGH (ref 6–20)
CO2: 23 mmol/L (ref 22–32)
Calcium: 9.3 mg/dL (ref 8.9–10.3)
Chloride: 106 mmol/L (ref 98–111)
Creatinine, Ser: 1.1 mg/dL — ABNORMAL HIGH (ref 0.44–1.00)
GFR, Estimated: >60 mL/min (ref >60)
Glucose, Bld: 160 mg/dL — ABNORMAL HIGH (ref 70–99)
Potassium: 4.2 mmol/L (ref 3.5–5.1)
Sodium: 139 mmol/L (ref 135–145)

## 2022-07-03 LAB — GLUCOSE, CAPILLARY
Glucose-Capillary: 81 mg/dL (ref 70–99)
Glucose-Capillary: 237 mg/dL — ABNORMAL HIGH (ref 70–99)
Glucose-Capillary: 217 mg/dL — ABNORMAL HIGH (ref 70–99)
Glucose-Capillary: 112 mg/dL — ABNORMAL HIGH (ref 70–99)

## 2022-07-03 LAB — STREP PNEUMONIAE URINARY ANTIGEN: Strep Pneumo Urinary Antigen: NEGATIVE

## 2022-07-03 LAB — PHOSPHORUS: Phosphorus: 3.4 mg/dL (ref 2.5–4.6)

## 2022-07-03 MED ORDER — IPRATROPIUM-ALBUTEROL 0.5-2.5 (3) MG/3ML IN SOLN
3.0000 mL | RESPIRATORY_TRACT | Status: DC | PRN
  Administered 2022-07-04 – 2022-07-06 (×4): 3 mL via RESPIRATORY_TRACT
  Filled 2022-07-03 (×4): qty 3

## 2022-07-03 MED ORDER — METHYLPREDNISOLONE SODIUM SUCC 125 MG IJ SOLR: 125.0000 mg | INTRAMUSCULAR | Status: DC

## 2022-07-03 MED ORDER — SODIUM CHLORIDE 3 % IN NEBU
4.0000 mL | INHALATION_SOLUTION | Freq: Two times a day (BID) | RESPIRATORY_TRACT | Status: DC
  Administered 2022-07-03 – 2022-07-05 (×5): 4 mL via RESPIRATORY_TRACT
  Filled 2022-07-03 (×6): qty 4

## 2022-07-03 MED ORDER — LIDOCAINE HCL (PF) 2 % IJ SOLN
2.0000 mL | Freq: Once | INTRAMUSCULAR | Status: DC
  Filled 2022-07-03: qty 2

## 2022-07-03 MED ORDER — AMOXICILLIN-POT CLAVULANATE 400-57 MG/5ML PO SUSR
875.0000 mg | Freq: Two times a day (BID) | ORAL | Status: DC
  Administered 2022-07-03 – 2022-07-08 (×11): 875 mg via ORAL
  Filled 2022-07-03 (×13): qty 10.9

## 2022-07-03 MED ORDER — REVEFENACIN 175 MCG/3ML IN SOLN
175.0000 ug | Freq: Every day | RESPIRATORY_TRACT | Status: DC
  Administered 2022-07-03 – 2022-07-08 (×6): 175 ug via RESPIRATORY_TRACT
  Filled 2022-07-03 (×7): qty 3

## 2022-07-03 MED ORDER — LORAZEPAM 2 MG/ML IJ SOLN
1.0000 mg | INTRAMUSCULAR | Status: DC | PRN
  Administered 2022-07-03 – 2022-07-04 (×3): 1 mg via INTRAVENOUS
  Filled 2022-07-03 (×3): qty 1

## 2022-07-03 MED ORDER — HYDROCORTISONE 20 MG PO TABS
20.0000 mg | ORAL_TABLET | Freq: Every evening | ORAL | Status: DC
  Filled 2022-07-03: qty 1

## 2022-07-03 MED ORDER — BUMETANIDE 1 MG PO TABS
1.0000 mg | ORAL_TABLET | Freq: Two times a day (BID) | ORAL | Status: DC
  Administered 2022-07-03 – 2022-07-09 (×11): 1 mg via ORAL
  Filled 2022-07-03 (×14): qty 1

## 2022-07-03 MED ORDER — HYDROMORPHONE HCL 1 MG/ML IJ SOLN
0.5000 mg | INTRAMUSCULAR | Status: DC | PRN
  Administered 2022-07-03 – 2022-07-04 (×4): 1 mg via INTRAVENOUS
  Filled 2022-07-03 (×4): qty 1

## 2022-07-03 MED ORDER — BENZONATATE 100 MG PO CAPS
200.0000 mg | ORAL_CAPSULE | Freq: Three times a day (TID) | ORAL | Status: DC | PRN
  Administered 2022-07-03 – 2022-07-08 (×11): 200 mg via ORAL
  Filled 2022-07-03 (×11): qty 2

## 2022-07-03 MED ORDER — AMOXICILLIN-POT CLAVULANATE 600-42.9 MG/5ML PO SUSR
875.0000 mg | Freq: Two times a day (BID) | ORAL | Status: DC
  Filled 2022-07-03: qty 7.3

## 2022-07-03 MED ORDER — ARFORMOTEROL TARTRATE 15 MCG/2ML IN NEBU
15.0000 ug | INHALATION_SOLUTION | Freq: Two times a day (BID) | RESPIRATORY_TRACT | Status: DC
  Administered 2022-07-03 – 2022-07-08 (×11): 15 ug via RESPIRATORY_TRACT
  Filled 2022-07-03 (×11): qty 2

## 2022-07-03 MED ORDER — MONTELUKAST SODIUM 10 MG PO TABS
10.0000 mg | ORAL_TABLET | Freq: Every day | ORAL | Status: DC
  Administered 2022-07-03 – 2022-07-08 (×4): 10 mg via ORAL
  Filled 2022-07-03 (×4): qty 1

## 2022-07-03 MED ORDER — HYDROCORTISONE 20 MG PO TABS
30.0000 mg | ORAL_TABLET | Freq: Every morning | ORAL | Status: DC
  Administered 2022-07-03: 30 mg via ORAL
  Filled 2022-07-03: qty 1

## 2022-07-03 MED ORDER — BUDESONIDE 0.5 MG/2ML IN SUSP
0.5000 mg | Freq: Two times a day (BID) | RESPIRATORY_TRACT | Status: DC
  Administered 2022-07-03 – 2022-07-08 (×11): 0.5 mg via RESPIRATORY_TRACT
  Filled 2022-07-03 (×11): qty 2

## 2022-07-03 MED ORDER — FAMOTIDINE 20 MG PO TABS
20.0000 mg | ORAL_TABLET | Freq: Every day | ORAL | Status: DC
  Administered 2022-07-03 – 2022-07-08 (×4): 20 mg via ORAL
  Filled 2022-07-03 (×4): qty 1

## 2022-07-03 NOTE — Progress Notes (Signed)
Subjective:  Patient tells me she has had 2 rapid responses called since I last saw her   Antibiotics:  Anti-infectives (From admission, onward)    Start     Dose/Rate Route Frequency Ordered Stop   07/03/22 1430  amoxicillin-clavulanate (AUGMENTIN) 400-57 MG/5ML suspension 875 mg        875 mg Oral Every 12 hours 07/03/22 1346     07/03/22 1200  amoxicillin-clavulanate (AUGMENTIN) 600-42.9 MG/5ML suspension 876 mg  Status:  Discontinued        875 mg of amoxicillin Oral 2 times daily 07/03/22 1006 07/03/22 1346   07/03/22 1000  azithromycin (ZITHROMAX) tablet 500 mg  Status:  Discontinued        500 mg Oral Daily 07/02/22 1220 07/03/22 1005   07/02/22 1400  cefTRIAXone (ROCEPHIN) 2 g in sodium chloride 0.9 % 100 mL IVPB  Status:  Discontinued        2 g 200 mL/hr over 30 Minutes Intravenous Every 24 hours 07/02/22 1100 07/03/22 1005   07/02/22 1000  azithromycin (ZITHROMAX) tablet 500 mg  Status:  Discontinued        500 mg Oral Daily 07/01/22 1535 07/02/22 1220   07/01/22 2100  ceFEPIme (MAXIPIME) 2 g in sodium chloride 0.9 % 100 mL IVPB  Status:  Discontinued        2 g 200 mL/hr over 30 Minutes Intravenous Every 8 hours 07/01/22 1543 07/02/22 1100   07/01/22 1830  fluconazole (DIFLUCAN) tablet 150 mg       Note to Pharmacy: TAKE 1 TABLET (150 MG TOTAL) BY MOUTH DAILY. IF NEEDED FOR YEAST INFECTION     150 mg Oral  Once 07/01/22 1718     07/01/22 1245  ceFEPIme (MAXIPIME) 2 g in sodium chloride 0.9 % 100 mL IVPB        2 g 200 mL/hr over 30 Minutes Intravenous  Once 07/01/22 1234 07/01/22 1352   07/01/22 1230  azithromycin (ZITHROMAX) 500 mg in sodium chloride 0.9 % 250 mL IVPB        500 mg 250 mL/hr over 60 Minutes Intravenous  Once 07/01/22 1225 07/01/22 1513       Medications: Scheduled Meds:  amoxicillin-clavulanate  875 mg Oral Q12H   arformoterol  15 mcg Nebulization BID   budesonide (PULMICORT) nebulizer solution  0.5 mg Nebulization BID   bumetanide   1 mg Oral BID   buPROPion  150 mg Oral Daily   enoxaparin (LOVENOX) injection  40 mg Subcutaneous Q24H   escitalopram  20 mg Oral QPM   famotidine  20 mg Oral QHS   fluconazole  150 mg Oral Once   guaiFENesin  600 mg Oral BID   insulin aspart  0-5 Units Subcutaneous QHS   insulin aspart  0-9 Units Subcutaneous TID WC   lamoTRIgine  150 mg Oral QHS   levothyroxine  100 mcg Oral q morning   lidocaine HCl (PF)  2 mL Other Once   methylPREDNISolone (SOLU-MEDROL) injection  125 mg Intravenous Q24H   montelukast  10 mg Oral QHS   pantoprazole  40 mg Oral Daily   revefenacin  175 mcg Nebulization Daily   sodium chloride HYPERTONIC  4 mL Nebulization BID   topiramate  150 mg Oral QHS   Continuous Infusions: PRN Meds:.benzonatate, diphenhydrAMINE, dronabinol, guaiFENesin-dextromethorphan, HYDROmorphone (DILAUDID) injection, ibuprofen, ipratropium-albuterol, LORazepam, Rimegepant Sulfate, zolpidem    Objective: Weight change: 5.208 kg  Intake/Output Summary (Last 24 hours) at 07/03/2022  [place] filed at 07/03/2022 1400 Gross per 24 hour  Intake 120 ml  Output --  Net 120 ml   Blood pressure 119/72, pulse (!) 114, temperature 97.7 F (36.5 C), temperature source Oral, resp. rate (!) 22, height _0  (1.651 m), weight 87.4 kg, SpO2 100 %. Temp:  [97.7 F (36.5 C)-98.1 F (36.7 C)] 97.7 F (36.5 C) (10/03 1400) Pulse Rate:  [106-117] 114 (10/03 1400) Resp:  [17-22] 22 (10/03 1400) BP: (119-125)/(72-73) 119/72 (10/03 1400) SpO2:  [97 %-100 %] 100 % (10/03 1522) Weight:  [87.4 kg] 87.4 kg (10/03 0435)  Physical Exam: Physical Exam Constitutional:      General: She is not in acute distress.    Appearance: She is well-developed. She is not diaphoretic.  HENT:     Head: Normocephalic and atraumatic.     Right Ear: External ear normal.     Left Ear: External ear normal.     Mouth/Throat:     Pharynx: No oropharyngeal exudate.  Eyes:     General: No scleral icterus.     Conjunctiva/sclera: Conjunctivae normal.     Pupils: Pupils are equal, round, and reactive to light.  Cardiovascular:     Rate and Rhythm: Normal rate and regular rhythm.  Pulmonary:     Effort: Pulmonary effort is normal. No respiratory distress.     Breath sounds: Rhonchi present. No wheezing or rales.  Abdominal:     General: There is no distension.     Palpations: Abdomen is soft.  Musculoskeletal:        General: No tenderness. Normal range of motion.  Lymphadenopathy:     Cervical: No cervical adenopathy.  Skin:    General: Skin is warm and dry.     Coloration: Skin is not pale.     Findings: No erythema or rash.  Neurological:     General: No focal deficit present.     Mental Status: She is alert and oriented to person, place, and time.     Motor: No abnormal muscle tone.     Coordination: Coordination normal.  Psychiatric:        Attention and Perception: Attention normal.        Mood and Affect: Mood is anxious and depressed.        Speech: Speech normal.        Behavior: Behavior normal.        Thought Content: Thought content normal.        Cognition and Memory: Cognition and memory normal.        Judgment: Judgment normal.      CBC:    BMET Recent Labs    07/01/22 1336 07/03/22 0529  NA 139 139  K 3.0* 4.2  CL 100 106  CO2 26 23  GLUCOSE 138* 160*  BUN 25* 30*  CREATININE 1.55* 1.10*  CALCIUM 8.9 9.3     Liver Panel  Recent Labs    07/01/22 1336  PROT 7.6  ALBUMIN 4.0  AST 32  ALT 29  ALKPHOS 70  BILITOT 0.7       Sedimentation Rate No results for input(s): "ESRSEDRATE" in the last 72 hours. C-Reactive Protein No results for input(s): "CRP" in the last 72 hours.  Micro Results: Recent Results (from the past 720 hour(s))  Culture, blood (routine x 2)     Status: None (Preliminary result)   Collection Time: 07/01/22 12:57 PM   Specimen: BLOOD  Result Value Ref Range Status  Specimen Description   Final    BLOOD RIGHT  ANTECUBITAL Performed at [facility] [address], [place], [place] [address]    Special Requests   Final    BOTTLES DRAWN AEROBIC ONLY Blood Culture adequate volume Performed at [facility] [address], [place], [place] [address]    Culture   Final    NO GROWTH 2 DAYS Performed at [facility] [address] [place], [place] [address]    Report Status PENDING  Incomplete  Culture, blood (routine x 2)     Status: None (Preliminary result)   Collection Time: 07/01/22 12:57 PM   Specimen: BLOOD  Result Value Ref Range Status   Specimen Description   Final    BLOOD LEFT ANTECUBITAL Performed at [facility] [address], [place], [place] [address]    Special Requests   Final    BOTTLES DRAWN AEROBIC AND ANAEROBIC Blood Culture adequate volume Performed at [facility] [address], [place], [place] [address]    Culture   Final    NO GROWTH 2 DAYS Performed at [facility] [address], [place], [place] [address]    Report Status PENDING  Incomplete  Resp Panel by RT-PCR (Flu A&B, Covid) Peripheral     Status: None   Collection Time: 07/01/22  1:39 PM   Specimen: Peripheral; Nasal Swab  Result Value Ref Range Status   SARS Coronavirus 2 by RT PCR NEGATIVE NEGATIVE Final    Comment: (NOTE) SARS-CoV-2 target nucleic acids are NOT DETECTED.  The SARS-CoV-2 RNA is generally detectable in upper respiratory specimens during the acute phase of infection. The lowest concentration of SARS-CoV-2 viral copies this assay can detect is 138 copies/mL. A negative result does not preclude SARS-Cov-2 infection and should not be used as the sole basis for treatment or other patient management decisions. A negative result may occur with  improper specimen collection/handling, submission of specimen other than nasopharyngeal swab, presence of viral mutation(s) within the areas targeted by  this assay, and inadequate number of viral copies(<138 copies/mL). A negative result must be combined with clinical observations, patient history, and epidemiological information. The expected result is Negative.  Fact Sheet for Patients:  EntrepreneurPulse.com.au  Fact Sheet for Healthcare Providers:  IncredibleEmployment.be  This test is no t yet approved or cleared by the [place] FDA and  has been authorized for detection and/or diagnosis of SARS-CoV-2 by FDA under an Emergency Use Authorization (EUA). This EUA will remain  in effect (meaning this test can be used) for the duration of the COVID-19 declaration under Section 564(b)(1) of the Act, 21 U.S.C.section 360bbb-3(b)(1), unless the authorization is terminated  or revoked sooner.       Influenza A by PCR NEGATIVE NEGATIVE Final   Influenza B by PCR NEGATIVE NEGATIVE Final    Comment: (NOTE) The Xpert Xpress SARS-CoV-2/FLU/RSV plus assay is intended as an aid in the diagnosis of influenza from Nasopharyngeal swab specimens and should not be used as a sole basis for treatment. Nasal washings and aspirates are unacceptable for Xpert Xpress SARS-CoV-2/FLU/RSV testing.  Fact Sheet for Patients: EntrepreneurPulse.com.au  Fact Sheet for Healthcare Providers: IncredibleEmployment.be  This test is not yet approved or cleared by the [place] FDA and has been authorized for detection and/or diagnosis of SARS-CoV-2 by FDA under an Emergency Use Authorization (EUA). This EUA will remain in effect (meaning this test can be used) for the  duration of the COVID-19 declaration under Section 564(b)(1) of the Act, 21 U.S.C. section 360bbb-3(b)(1), unless the authorization is terminated or revoked.  Performed at [facility], [place] [address], [place], [place] [address]   Expectorated Sputum Assessment w Gram Stain, Rflx to Resp Cult      Status: None   Collection Time: 07/03/22  6:36 AM   Specimen: Expectorated Sputum  Result Value Ref Range Status   Specimen Description EXPECTORATED SPUTUM  Final   Special Requests NONE  Final   Sputum evaluation   Final    THIS SPECIMEN IS ACCEPTABLE FOR SPUTUM CULTURE Performed at [facility], [place] [address], [place], [place] [address]    Report Status 07/03/2022 FINAL  Final  Culture, Respiratory w Gram Stain     Status: None (Preliminary result)   Collection Time: 07/03/22  6:36 AM  Result Value Ref Range Status   Specimen Description   Final    EXPECTORATED SPUTUM Performed at [facility] [address], [place], [place] [address]    Special Requests   Final    NONE Reflexed from [id number] Performed at [facility], [place] [address], [place],  [address]    Gram Stain   Final    FEW WBC PRESENT,BOTH PMN AND MONONUCLEAR FEW BUDDING YEAST SEEN RARE GRAM POSITIVE RODS Performed at [facility], [place] [address] [place],  [address]    Culture PENDING  Incomplete   Report Status PENDING  Incomplete    Studies/Results: DG CHEST PORT 1 VIEW  Result Date: 07/02/2022 CLINICAL DATA:  Shortness of breath EXAM: PORTABLE CHEST 1 VIEW COMPARISON:  07/01/2022 FINDINGS: Single frontal view of the chest demonstrates a stable cardiac silhouette. There is persistent retrocardiac consolidation, unchanged since prior exam. No effusion or pneumothorax. No acute bony abnormalities. IMPRESSION: 1. Stable left basilar consolidation, which may reflect infection or aspiration. Electronically Signed   By: Randa Ngo M.D.   On: 07/02/2022 18:20      Assessment/Plan:  INTERVAL HISTORY: patient has had episodes of feeling like she cannot breath with POX going down she recounts and 2 rapid responses called   Principal Problem:   Sepsis ([place]) Active Problems:   Hx of Hodgkins lymphoma   Adrenal insufficiency (HCC)    Hypertension   Raynaud phenomenon   GERD (gastroesophageal reflux disease)   Hyperlipidemia   Chronic respiratory failure with hypoxia (HCC)   AKI (acute kidney injury) (HCC)    PAHOLA DIMMITT is a 51 y.o. female with  hx of multiple malignancies that she has survived, COPD, prior bactermias in settinf or port now admitted with pneumonia + asthma exacerbation  #1 Pneumonia:  Agree with CCM idea of checking a complete respiratory virus panel (if there is a culprit virus we could de-escalate away from antibacterial therapy  For now continue liquid augmentin  #2 Asthma: grateful to CCM for seeing her  #3 Anxiety: there certainly seems to be some component of significant anxiety to her presentation but that is all the more reason to engage with pulmonary.    I spent 52 minutes with the patient including than 50% of the time in face to face counseling of the patient  re her pneumonia, personally reviewing CXR along with review of medical records in preparation for the visit and during the visit and in coordination of her care.   LOS: 2 days   Alcide Evener 07/03/2022, 6:33 PM

## 2022-07-03 NOTE — Progress Notes (Signed)
PROGRESS NOTE    [name]  VEL:[id number] DOB: 1970-12-11 DOA: 07/01/2022 PCP: Su Grand, MD    Brief Narrative:  51 year old with history of extensive multiple cancers, chronic hypoxemia on 4 L oxygen, Addison's disease, GERD, migraine, adrenal insufficiency, hypothyroidism anxiety depression chronic pain syndrome and recurrent admissions, recent bacteremia presented with feeling weak, fatigue and fever at home, shortness of breath not improved with inhalers at home.  In the emergency room temperature 102.3, WBC count 16.5, blood pressures 90/58, lactic acid 3.  Chest x-ray with left lower lobe infiltrate.  Admitted and treated as sepsis due to pneumonia. Rapid responses 10/2 evening and night, 10/3 for intractable cough and bronchospasm.  Anxiety attacks after starting cough.   Assessment & Plan:   Sepsis present on admission secondary to left lower lobe pneumonia, immunocompromised patient.  Hypoxemia, lactic acidosis and leukocytosis. Treated with Rocephin and azithromycin.  Blood cultures negative.  Sputum cultures negative.   Afebrile last 24 hours.  ID suggested Augmentin, she will take Augmentin 875 mg as liquid formulary because it is difficult for her to take pills.  Chest physiotherapy, incentive spirometry, deep breathing exercises, sputum induction, mucolytic's and bronchodilators. Sputum cultures, blood cultures, Legionella and streptococcal antigen pending and negative so far. Supplemental oxygen to keep saturations more than 90%.  Continue mobility. Extensive infectious history, infectious team following. Leukocytosis is likely secondary to high-dose steroids.  Acute renal failure: Treated with IV fluids.  Hemodynamics improved.  Levels improved.   She will go back on Bumex.  Chronic hypoxemic respiratory failure: Continuing saturations on 3 to 4 L oxygen.  At baseline.  Current diastolic heart failure pulm beta-blockers diuretics.  Resume beta-blockers and  Bumex.   Adrenal insufficiency: Currently on stress dose of steroids.  Home dose Solu-Cortef 20/10 mg daily, will use 30/20 mg daily for 3 days and then she will go back to her home doses.  Anxiety depression, insomnia.  Multiple pain issues and nausea. Symptomatic treatment. Resume Klonopin, Wellbutrin, Marinol, Lexapro, Lamictal and Ambien.  Hyperglycemia associated with steroid use: Cover with sliding scale insulin.  Patient on Januvia at home.  Hypokalemia: Liquid replaced.   DVT prophylaxis: enoxaparin (LOVENOX) injection 40 mg Start: 07/01/22 2200   Code Status: DNR with full scope of treatment Family Communication: Husband at the bedside Disposition Plan: Status is: Inpatient Remains inpatient appropriate because: Significant bronchospasm, frequent rapid responses.     Consultants:  Infectious disease  Procedures:  None  Antimicrobials:  Cefepime and azithromycin 10/1--- 10/3 Augmentin 10/3---   Subjective:  Patient seen and examined.  Husband at bedside.  Afebrile overnight.  Multiple rapid responses for coughing spells. Attended another rapid response in the afternoon due to ongoing nagging cough, oxygenation normal.  Improved with Ativan and albuterol.  Objective: Vitals:   07/02/22 2237 07/03/22 0435 07/03/22 1031 07/03/22 1254  BP: 123/72 125/72    Pulse: (!) 117 (!) 106    Resp: 17 17 (!) 22   Temp: 98.1 F (36.7 C) 98 F (36.7 C)    TempSrc: Oral Oral    SpO2: 100% 100% 97% 100%  Weight:  87.4 kg    Height:        Intake/Output Summary (Last 24 hours) at 07/03/2022 1315 Last data filed at 07/02/2022 1400 Gross per 24 hour  Intake 573.58 ml  Output --  Net 573.58 ml    Filed Weights   07/01/22 1722 07/03/22 0435  Weight: 82.2 kg 87.4 kg    Examination:  General  exam: Appears anxious, tearful with nagging cough. Respiratory system: Poor lateral air entry.  Not in any distress.  Dry nagging cough. Cardiovascular system: S1 & S2 heard,  RRR. Gastrointestinal system: Abdomen is nondistended, soft and nontender. No organomegaly or masses felt. Normal bowel sounds heard. Central nervous system: Alert and oriented. No focal neurological deficits. Extremities: Symmetric 5 x 5 power. Skin: 2 punctate lesions on hands and legs.    Data Reviewed: I have personally reviewed following labs and imaging studies  CBC: Recent Labs  Lab 07/01/22 1336 07/03/22 0529  WBC 16.5* 17.8*  NEUTROABS 14.1* 15.6*  HGB 9.8* 8.7*  HCT 32.2* 29.4*  MCV 80.7 82.8  PLT 328 258    Basic Metabolic Panel: Recent Labs  Lab 07/01/22 1336 07/03/22 0529  NA 139 139  K 3.0* 4.2  CL 100 106  CO2 26 23  GLUCOSE 138* 160*  BUN 25* 30*  CREATININE 1.55* 1.10*  CALCIUM 8.9 9.3  MG  --  2.3  PHOS  --  3.4    GFR: Estimated Creatinine Clearance: 66.1 mL/min (A) (by C-G formula based on SCr of 1.1 mg/dL (H)). Liver Function Tests: Recent Labs  Lab 07/01/22 1336  AST 32  ALT 29  ALKPHOS 70  BILITOT 0.7  PROT 7.6  ALBUMIN 4.0    Recent Labs  Lab 07/01/22 1336  LIPASE 44    No results for input(s): "AMMONIA" in the last 168 hours. Coagulation Profile: No results for input(s): "INR", "PROTIME" in the last 168 hours. Cardiac Enzymes: No results for input(s): "CKTOTAL", "CKMB", "CKMBINDEX", "TROPONINI" in the last 168 hours. BNP (last 3 results) No results for input(s): "PROBNP" in the last 8760 hours. HbA1C: No results for input(s): "HGBA1C" in the last 72 hours. CBG: Recent Labs  Lab 07/02/22 1230 07/02/22 1729 07/02/22 2233 07/03/22 0727 07/03/22 1123  GLUCAP 187* 190* 186* 81 237*   Lipid Profile: No results for input(s): "CHOL", "HDL", "LDLCALC", "TRIG", "CHOLHDL", "LDLDIRECT" in the last 72 hours. Thyroid Function Tests: No results for input(s): "TSH", "T4TOTAL", "FREET4", "T3FREE", "THYROIDAB" in the last 72 hours. Anemia Panel: No results for input(s): "VITAMINB12", "FOLATE", "FERRITIN", "TIBC", "IRON",  "RETICCTPCT" in the last 72 hours. Sepsis Labs: Recent Labs  Lab 07/01/22 1512  LATICACIDVEN 3.0*     Recent Results (from the past 240 hour(s))  Culture, blood (routine x 2)     Status: None (Preliminary result)   Collection Time: 07/01/22 12:57 PM   Specimen: BLOOD  Result Value Ref Range Status   Specimen Description   Final    BLOOD RIGHT ANTECUBITAL Performed at [facility] [address], [place], [place] [address]    Special Requests   Final    BOTTLES DRAWN AEROBIC ONLY Blood Culture adequate volume Performed at [facility] [address] [place], [place] [address]    Culture   Final    NO GROWTH 2 DAYS Performed at [facility] [address], [place], [place] [address]    Report Status PENDING  Incomplete  Culture, blood (routine x 2)     Status: None (Preliminary result)   Collection Time: 07/01/22 12:57 PM   Specimen: BLOOD  Result Value Ref Range Status   Specimen Description   Final    BLOOD LEFT ANTECUBITAL Performed at [facility] [address], [place],  [address]    Special Requests   Final    BOTTLES DRAWN AEROBIC AND ANAEROBIC Blood Culture adequate  volume Performed at [facility], [place] [address] [place], [place] [address]    Culture   Final    NO GROWTH 2 DAYS Performed at [facility] [address] [place], [place] [address]    Report Status PENDING  Incomplete  Resp Panel by RT-PCR (Flu A&B, Covid) Peripheral     Status: None   Collection Time: 07/01/22  1:39 PM   Specimen: Peripheral; Nasal Swab  Result Value Ref Range Status   SARS Coronavirus 2 by RT PCR NEGATIVE NEGATIVE Final    Comment: (NOTE) SARS-CoV-2 target nucleic acids are NOT DETECTED.  The SARS-CoV-2 RNA is generally detectable in upper respiratory specimens during the acute phase of infection. The lowest concentration of SARS-CoV-2 viral copies this assay can detect  is 138 copies/mL. A negative result does not preclude SARS-Cov-2 infection and should not be used as the sole basis for treatment or other patient management decisions. A negative result may occur with  improper specimen collection/handling, submission of specimen other than nasopharyngeal swab, presence of viral mutation(s) within the areas targeted by this assay, and inadequate number of viral copies(<138 copies/mL). A negative result must be combined with clinical observations, patient history, and epidemiological information. The expected result is Negative.  Fact Sheet for Patients:  EntrepreneurPulse.com.au  Fact Sheet for Healthcare Providers:  IncredibleEmployment.be  This test is no t yet approved or cleared by the [place] FDA and  has been authorized for detection and/or diagnosis of SARS-CoV-2 by FDA under an Emergency Use Authorization (EUA). This EUA will remain  in effect (meaning this test can be used) for the duration of the COVID-19 declaration under Section 564(b)(1) of the Act, 21 U.S.C.section 360bbb-3(b)(1), unless the authorization is terminated  or revoked sooner.       Influenza A by PCR NEGATIVE NEGATIVE Final   Influenza B by PCR NEGATIVE NEGATIVE Final    Comment: (NOTE) The Xpert Xpress SARS-CoV-2/FLU/RSV plus assay is intended as an aid in the diagnosis of influenza from Nasopharyngeal swab specimens and should not be used as a sole basis for treatment. Nasal washings and aspirates are unacceptable for Xpert Xpress SARS-CoV-2/FLU/RSV testing.  Fact Sheet for Patients: EntrepreneurPulse.com.au  Fact Sheet for Healthcare Providers: IncredibleEmployment.be  This test is not yet approved or cleared by the [place] FDA and has been authorized for detection and/or diagnosis of SARS-CoV-2 by FDA under an Emergency Use Authorization (EUA). This EUA will remain in effect  (meaning this test can be used) for the duration of the COVID-19 declaration under Section 564(b)(1) of the Act, 21 U.S.C. section 360bbb-3(b)(1), unless the authorization is terminated or revoked.  Performed at [facility], [place] [address] [place], [place] [address]   Expectorated Sputum Assessment w Gram Stain, Rflx to Resp Cult     Status: None   Collection Time: 07/03/22  6:36 AM   Specimen: Expectorated Sputum  Result Value Ref Range Status   Specimen Description EXPECTORATED SPUTUM  Final   Special Requests NONE  Final   Sputum evaluation   Final    THIS SPECIMEN IS ACCEPTABLE FOR SPUTUM CULTURE Performed at [facility], [place] [address] [place], [place] [address]    Report Status 07/03/2022 FINAL  Final  Culture, Respiratory w Gram Stain     Status: None (Preliminary result)   Collection Time: 07/03/22  6:36 AM  Result Value Ref Range Status   Specimen Description   Final    EXPECTORATED SPUTUM  Performed at [facility], [place] [address] [place], [place] [address]    Special Requests   Final    NONE Reflexed from [phone] Performed at [facility], [place] [address], [place], [place] [address]    Gram Stain   Final    FEW WBC PRESENT,BOTH PMN AND MONONUCLEAR FEW BUDDING YEAST SEEN RARE GRAM POSITIVE RODS Performed at [facility], [place] [address], [place], [place] [address]    Culture PENDING  Incomplete   Report Status PENDING  Incomplete         Radiology Studies: DG CHEST PORT 1 VIEW  Result Date: 07/02/2022 CLINICAL DATA:  Shortness of breath EXAM: PORTABLE CHEST 1 VIEW COMPARISON:  07/01/2022 FINDINGS: Single frontal view of the chest demonstrates a stable cardiac silhouette. There is persistent retrocardiac consolidation, unchanged since prior exam. No effusion or pneumothorax. No acute bony abnormalities. IMPRESSION: 1. Stable left basilar consolidation, which may reflect infection  or aspiration. Electronically Signed   By: Randa Ngo M.D.   On: 07/02/2022 18:20        Scheduled Meds:  albuterol  2.5 mg Nebulization Q4H WA   amoxicillin-clavulanate  875 mg of amoxicillin Oral BID   bumetanide  1 mg Oral BID   buPROPion  150 mg Oral Daily   enoxaparin (LOVENOX) injection  40 mg Subcutaneous Q24H   escitalopram  20 mg Oral QPM   fluconazole  150 mg Oral Once   guaiFENesin  600 mg Oral BID   hydrocortisone  20 mg Oral QPM   hydrocortisone  30 mg Oral q morning   insulin aspart  0-5 Units Subcutaneous QHS   insulin aspart  0-9 Units Subcutaneous TID WC   lamoTRIgine  150 mg Oral QHS   levothyroxine  100 mcg Oral q morning   mometasone-formoterol  2 puff Inhalation BID   pantoprazole  40 mg Oral Daily   topiramate  150 mg Oral QHS   Continuous Infusions:     LOS: 2 days    Time spent: 35 minutes    Barb Merino, MD [facility] Pager [phone]

## 2022-07-03 NOTE — Consult Note (Signed)
NAME:  [name], MRN:  [id number], DOB:  1971-02-01, LOS: 2 ADMISSION DATE:  07/01/2022, CONSULTATION DATE:  07/03/22 REFERRING MD:  Barb Merino, MD CHIEF COMPLAINT:  Bronchospasms   History of Present Illness:  [name] is a 51 year old woman with history of hodgkin's lymphoma, breast cancer, cervical cancer, thyroid cancer, hypothyroidism, addison's disease, GERD, asthma, chronic hypoxemic respiratory failure, HFpEF, hypertension, anxiety/depression, and chronic pain syndrome who was admitted for sepsis due to pneumonia on 07/01/22. PCCM is consulted for ongoing bronchospasms.   She has dyspnea, wheezing, intermittent sensation of her throat narrowing. She had ENT evaluation last year and flexible laryngoscopy was unremarkable. She reports her symptoms are worse than her usual asthma exacerbation. She reports fevers earlier on during the onset of these symptoms.  Respiratory culture is showing few budding yeast and rare gram positive rods. Influenza and covid negative on admission. CXR on admission showed left lower lobe opacity. She was started on cefepime and azithromycin, changed cefepime to ceftriaxone and now taking augmentin.   She was last seen in pulmonary clinic 02/02/22 with transition to Advair 230-62mg 2 puffs twice daily. She reports being on nucala in the past with improvement of her breathing.  Pertinent  Medical History   Past Medical History:  Diagnosis Date   Addison's disease ([facility]    Adrenal insufficiency ([facility]    Anemia    Anxiety    Aortic stenosis    Aortic stenosis    Appendicitis 12/19/09   Appendicitis    Breast cancer (HCC)    STATUS POST BILATERAL MASTECTOMY. STATUS POST RECONSTRUCTION. SHE HAD SILICONE BREAST IMPLANTS AND THE LEFT IMPLANT IS LEAKING SLIGHTLY   Cellulitis of right middle finger 11/07/2018   Cervical cancer ([place] 12/23/2018   Chest pain    CHF with right heart failure ([facility] 04/17/2017   Chronic respiratory failure with hypoxia (HCC)  12/23/2018   Cough variant asthma 04/13/2019   Depression    GERD (gastroesophageal reflux disease)    takes Dexilant and carafate and gi coctail    Headache    migraines on a daily and monthly regimen    Heart murmur    History of kidney stones    Hodgkin lymphoma ([facility]    STATUS POST MANTLE RADIATION   Hodgkin's lymphoma ([facility]    1987   Hypertension    Hypoxia    Necrotizing fasciitis ([facility] 12/23/2018   Non-ischemic cardiomyopathy ([place]    Osteoporosis    Palpitations    Pituitary adenoma ([place] 12/23/2018   Pneumonia    PONV (postoperative nausea and vomiting)    Pre-diabetes    per pt; no meds   Pulmonary hypertension ([place] 12/23/2018   Raynaud phenomenon    Right heart failure ([facility] 04/17/2017   Seizures ([facility]    last febrile seizure was approx 3 weeks ago per report on 12/01/2020   Sleep apnea    upcoming sleep study per pt    Supplemental oxygen dependent    3 liters   SVT (supraventricular tachycardia)    Tachycardia    Thyroid cancer ([facility]    STATUS POST SURGICAL REMOVAL-CURRENT ON THYROID REPLACEMENT    Significant Hospital Events: Including procedures, antibiotic start and stop dates in addition to other pertinent events   10/1 admitted 10/3 PCCM consulted for recurrent bronchospasms  Interim History / Subjective:  As above  Objective   Blood pressure 119/72, pulse (!) 114, temperature 97.7 F (36.5 C), temperature source Oral, resp. rate (!) 22, height  _0  (1.651 m), weight 87.4 kg, SpO2 100 %.       No intake or output data in the 24 hours ending 07/03/22 1615 Filed Weights   07/01/22 1722 07/03/22 0435  Weight: 82.2 kg 87.4 kg    Examination: General: middle aged woman, mild to moderate distress HENT: [place]/AT, upper airway wheeze, moist mucous membranes Lungs: wheezing bilaterally, no rales Cardiovascular: tachycardic, no murmurs Abdomen: soft, non-tender, non-distended, BS+ Extremities: warm, trace edema Neuro: alert, oriented, moving all  extremities GU: n/a  Resolved Hospital Problem list     Assessment & Plan:  Severe Persistent Asthma with Exacerbation Bronchitis/Pneumonia Chronic Hypoxemic Respiratory Failure Adrenal Insufficiency Anxiety/Depression GERD  Discussion: Patients symptoms are concerning for exacerbation of her asthma due to pneumonia vs bronchitis. There is concern for possible viral infection. Other differentials include vocal cord dysfunction vs tracheobronchomalacia vs reactive airways due to GERD. HRCT chest from 2021 does shoe narrowing of her trachea and right mainstem bronchi concerning for TBM.   Plan: - Continue high dose steroids with solumedrol 56m BID IV - Will adjust her nebulizer treatments to budesonide + brovana twice daily and yupelri daily - PRN duonebs q4hrs - Will try a lidocaine nebulizer treatment once, if helpful can try PRN q8 to 12 hours - She is on pantoprazole 480mdaily, will add 2074mamotidine at bedtime - Recommend eating at least 2 hours before bed, sleep with head of bed 30 degrees or higher - Check extended respiratory viral panel and MRSA screen - ok to continue augmentin antibiotic therapy at this time  PCCM will continue to follow   Best Practice (right click and "Reselect all SmartList Selections" daily)   Per primary team  Labs   CBC: Recent Labs  Lab 07/01/22 1336 07/03/22 0529  WBC 16.5* 17.8*  NEUTROABS 14.1* 15.6*  HGB 9.8* 8.7*  HCT 32.2* 29.4*  MCV 80.7 82.8  PLT 328 [id number] Basic Metabolic Panel: Recent Labs  Lab 07/01/22 1336 07/03/22 0529  NA 139 139  K 3.0* 4.2  CL 100 106  CO2 26 23  GLUCOSE 138* 160*  BUN 25* 30*  CREATININE 1.55* 1.10*  CALCIUM 8.9 9.3  MG  --  2.3  PHOS  --  3.4   GFR: Estimated Creatinine Clearance: 66.1 mL/min (A) (by C-G formula based on SCr of 1.1 mg/dL (H)). Recent Labs  Lab 07/01/22 1336 07/01/22 1512 07/03/22 0529  WBC 16.5*  --  17.8*  LATICACIDVEN  --  3.0*  --     Liver Function  Tests: Recent Labs  Lab 07/01/22 1336  AST 32  ALT 29  ALKPHOS 70  BILITOT 0.7  PROT 7.6  ALBUMIN 4.0   Recent Labs  Lab 07/01/22 1336  LIPASE 44   No results for input(s): "AMMONIA" in the last 168 hours.  ABG    Component Value Date/Time   PHART 7.36 07/02/2022 1740   PCO2ART 40 07/02/2022 1740   PO2ART 104 07/02/2022 1740   HCO3 22.6 07/02/2022 1740   TCO2 21 (L) 04/02/2018 1343   TCO2 22 04/02/2018 1343   ACIDBASEDEF 2.9 (H) 07/02/2022 1740   O2SAT 100 07/02/2022 1740     Coagulation Profile: No results for input(s): "INR", "PROTIME" in the last 168 hours.  Cardiac Enzymes: No results for input(s): "CKTOTAL", "CKMB", "CKMBINDEX", "TROPONINI" in the last 168 hours.  HbA1C: Hgb A1c MFr Bld  Date/Time Value Ref Range Status  02/09/2022 12:02 PM 6.3 (H) 4.8 - 5.6 % Final  Comment:    (NOTE) Pre diabetes:          5.7%-6.4%  Diabetes:              >6.4%  Glycemic control for   <7.0% adults with diabetes   12/05/2020 02:55 PM 6.8 (H) 4.8 - 5.6 % Final    Comment:    (NOTE) Pre diabetes:          5.7%-6.4%  Diabetes:              >6.4%  Glycemic control for   <7.0% adults with diabetes     CBG: Recent Labs  Lab 07/02/22 1729 07/02/22 2233 07/03/22 0727 07/03/22 1123 07/03/22 1555  GLUCAP 190* 186* 81 237* 217*    Review of Systems:   Review of Systems  Constitutional:  Negative for chills, fever, malaise/fatigue and weight loss.  HENT:  Positive for congestion. Negative for sinus pain and sore throat.   Eyes: Negative.   Respiratory:  Positive for cough, sputum production, shortness of breath and wheezing. Negative for hemoptysis.   Cardiovascular:  Positive for chest pain. Negative for palpitations, orthopnea, claudication and leg swelling.  Gastrointestinal:  Negative for abdominal pain, heartburn, nausea and vomiting.  Genitourinary: Negative.   Musculoskeletal:  Negative for joint pain and myalgias.  Skin:  Negative for rash.   Neurological:  Negative for weakness.  Endo/Heme/Allergies: Negative.   Psychiatric/Behavioral: Negative.       Past Medical History:  She,  has a past medical history of Addison's disease ([place]), Adrenal insufficiency ([place]), Anemia, Anxiety, Aortic stenosis, Aortic stenosis, Appendicitis (12/19/09), Appendicitis, Breast cancer ([place]), Cellulitis of right middle finger (11/07/2018), Cervical cancer ([place]) (12/23/2018), Chest pain, CHF with right heart failure ([place]) (04/17/2017), Chronic respiratory failure with hypoxia ([place]) (12/23/2018), Cough variant asthma (04/13/2019), Depression, GERD (gastroesophageal reflux disease), Headache, Heart murmur, History of kidney stones, Hodgkin lymphoma ([place]), Hodgkin's lymphoma ([place]), Hypertension, Hypoxia, Necrotizing fasciitis ([place]) (12/23/2018), Non-ischemic cardiomyopathy ([place]), Osteoporosis, Palpitations, Pituitary adenoma ([place]) (12/23/2018), Pneumonia, PONV (postoperative nausea and vomiting), Pre-diabetes, Pulmonary hypertension ([place]) (12/23/2018), Raynaud phenomenon, Right heart failure ([place]) (04/17/2017), Seizures ([place]), Sleep apnea, Supplemental oxygen dependent, SVT (supraventricular tachycardia), Tachycardia, and Thyroid cancer ([place]).   Surgical History:   Past Surgical History:  Procedure Laterality Date   ABDOMINAL HYSTERECTOMY     AMPUTATION Left 01/30/2019   Procedure: Left Index finger amputation with flap reconstruction and repair reconstruction;  Surgeon: Roseanne Kaufman, MD;  Location: [facility];  Service: Orthopedics;  Laterality: Left;   APPENDECTOMY     breast implants and removal      breast implants but leaking      CARDIAC CATHETERIZATION  05/18/09   NORMAL CATH   COLONOSCOPY     hx of chemotherapy      hx of radiation therapy      I & D EXTREMITY Left 12/23/2018   Procedure: IRRIGATION AND DEBRIDEMENT HAND / INDEX FINGER;  Surgeon: Roseanne Kaufman, MD;  Location: [facility];  Service: Orthopedics;  Laterality: Left;   IR CV LINE INJECTION  03/22/2022    IR IMAGING GUIDED PORT INSERTION  05/06/2020   IR IMAGING GUIDED PORT INSERTION  12/04/2021   IR REMOVAL TUN ACCESS W/ PORT W/O FL MOD SED  04/27/2020   IR REMOVAL TUN ACCESS W/ PORT W/O FL MOD SED  12/04/2021   IR REMOVAL TUN ACCESS W/ PORT W/O FL MOD SED  03/30/2022   KIDNEY STONE SURGERY     LUMBAR PUNCTURE W/ INTRATHECAL CHEMOTHERAPY  MASTECTOMY     PITUITARY SURGERY     RIGHT/LEFT HEART CATH AND CORONARY ANGIOGRAPHY N/A 04/02/2018   Procedure: RIGHT/LEFT HEART CATH AND CORONARY ANGIOGRAPHY;  Surgeon: Burnell Blanks, MD;  Location: [facility];  Service: Cardiovascular;  Laterality: N/A;   TOOTH EXTRACTION N/A 12/05/2020   Procedure: DENTAL RESTORATION/EXTRACTIONS;  Surgeon: Ronal Fear, MD;  Location: [facility];  Service: Oral Surgery;  Laterality: N/A;   TOTAL THYROIDECTOMY     VIDEO BRONCHOSCOPY Bilateral 11/14/2018   Procedure: VIDEO BRONCHOSCOPY WITHOUT FLUORO;  Surgeon: Margaretha Seeds, MD;  Location: [facility];  Service: Cardiopulmonary;  Laterality: Bilateral;   VIDEO BRONCHOSCOPY WITH ENDOBRONCHIAL ULTRASOUND N/A 11/19/2018   Procedure: VIDEO BRONCHOSCOPY WITH ENDOBRONCHIAL ULTRASOUND;  Surgeon: Margaretha Seeds, MD;  Location: [facility];  Service: Thoracic;  Laterality: N/A;     Social History:   reports that she has never smoked. She has never used smokeless tobacco. She reports that she does not currently use alcohol. She reports that she does not use drugs.   Family History:  Her Family history is unknown by patient.   Allergies Allergies  Allergen Reactions   Ferrous Bisglycinate Chelate [Iron] Anaphylaxis and Other (See Comments)    Only IV - only FERRLICET   Mushroom Extract Complex Anaphylaxis   Na Ferric Gluc Cplx In Sucrose Anaphylaxis   Cymbalta [Duloxetine Hcl] Swelling and Anxiety   Ondansetron Hcl Other (See Comments)   Promethazine Other (See Comments)    Other reaction(s): Unknown   Promethazine Hcl     Other reaction(s): Unknown    Succinylcholine Other (See Comments)    Lock Jaw   Buprenorphine Hcl Hives   Compazine Other (See Comments)    Altered mental status Aggression   Metoclopramide Other (See Comments)    Dystonia   Morphine And Related Hives   Ondansetron Hives and Rash    Other reaction(s): Unknown Other reaction(s): Unknown   Promethazine Hcl Hives   Tegaderm Ag Mesh [Silver] Rash    Old formulation only, is able to tolerate new formulation     Home Medications  Prior to Admission medications   Medication Sig Start Date End Date Taking? Authorizing Provider  albuterol (PROVENTIL) (2.5 MG/3ML) 0.083% nebulizer solution Take 3 mLs (2.5 mg total) by nebulization every 6 (six) hours as needed for wheezing or shortness of breath. Patient taking differently: Take 2.5 mg by nebulization See admin instructions. Nebulize 2.5 mg and inhale into the lungs every 4-6 hours 03/27/21 07/01/22 Yes Magdalen Spatz, NP  aspirin 325 MG tablet Take 325 mg by mouth at bedtime.    Yes [provider]  buPROPion (WELLBUTRIN XL) 150 MG 24 hr tablet TAKE 1 TABLET BY MOUTH EVERY DAY Patient taking differently: Take 150 mg by mouth See admin instructions. Take 150 mg by mouth mid-morning every day 05/30/22  Yes Mozingo, Berdie Ogren, NP  clonazePAM (KLONOPIN) 0.5 MG tablet Take 1 tablet (0.5 mg total) by mouth 2 (two) times daily as needed for anxiety. Patient taking differently: Take 0.5 mg by mouth See admin instructions. Take 0.5 mg by mouth every morning and afternoon 06/19/22  Yes Mozingo, Berdie Ogren, NP  dexlansoprazole (DEXILANT) 60 MG capsule Take 60 mg by mouth daily.   Yes [provider]  Diclofenac Potassium,Migraine, (CAMBIA) 50 MG PACK Take 50 mg by mouth daily as needed for migraine. 05/16/21  Yes [provider]  diphenhydrAMINE (BENADRYL) 50 MG/ML injection Inject 25 mg into the muscle once as needed (  for nausea).   Yes [provider]  dronabinol (MARINOL) 2.5 MG capsule  Take 1 capsule (2.5 mg total) by mouth 2 (two) times daily as needed (nausea). Patient taking differently: Take 2.5 mg by mouth every evening. 05/07/22  Yes Nicholas Lose, MD  EMGALITY 120 MG/ML SOSY Inject 120 mg into the skin every 28 (twenty-eight) days.   Yes [provider]  EPINEPHrine 0.3 mg/0.3 mL IJ SOAJ injection Inject 0.3 mg into the muscle as needed for anaphylaxis. 06/13/21  Yes Margaretha Seeds, MD  escitalopram (LEXAPRO) 20 MG tablet Take 1 tablet (20 mg total) by mouth every evening. Patient taking differently: Take 20 mg by mouth See admin instructions. Take 20 mg by mouth mid-afternoon every day 11/08/21  Yes Mozingo, Berdie Ogren, NP  FLORASTOR 250 MG capsule Take 250 mg by mouth See admin instructions. Take 250 mg by mouth mid-morning and mid-afternoon   Yes [provider]  fluticasone-salmeterol (ADVAIR HFA) 230-21 MCG/ACT inhaler Inhale 2 puffs into the lungs 2 (two) times daily. Patient taking differently: Inhale 2 puffs into the lungs daily. 02/02/22  Yes Margaretha Seeds, MD  hydrocortisone (CORTEF) 10 MG tablet Take 1-2 tablets (10-20 mg total) by mouth See admin instructions. Take 20 mg in the am and 43m in the evening Patient taking differently: Take 10-20 mg by mouth See admin instructions. Take 20 mg by mouth in the morning and 10 mg in the afternoon 02/17/20  Yes Kyle, Tyrone A, DO  lamoTRIgine (LAMICTAL) 150 MG tablet Take one tablet at bedtime. Patient taking differently: Take 150 mg by mouth at bedtime. 06/19/22  Yes Mozingo, RBerdie Ogren NP  OXYGEN Inhale 3-4 L/min into the lungs continuous.   Yes [provider]  PROAIR HFA 108 (90 Base) MCG/ACT inhaler Inhale 2 puffs into the lungs every 4 (four) hours as needed for wheezing or shortness of breath.   Yes [provider]  Rimegepant Sulfate (NURTEC) 75 MG TBDP Take 75 mg by mouth daily as needed (Migraine).    Yes [provider]  rosuvastatin (CRESTOR) 10 MG tablet  Take 10 mg by mouth See admin instructions. Take 10 mg by mouth mid-morning every day 02/28/18  Yes [provider]  sitaGLIPtin (JANUVIA) 100 MG tablet Take 1 tablet (100 mg total) by mouth daily. 10/19/21  Yes GNicholas Lose MD  sucralfate (CARAFATE) 1 GM/10ML suspension Take 1 g by mouth daily as needed (as directed for ulcers).   Yes [provider]  SYNTHROID 100 MCG tablet Take 1 tablet (100 mcg total) by mouth every morning. 10/19/21  Yes GNicholas Lose MD  topiramate (TOPAMAX) 50 MG tablet Take 150 mg by mouth at bedtime.  12/25/19  Yes [provider]  zolpidem (AMBIEN CR) 12.5 MG CR tablet Take 12.5 mg by mouth at bedtime. 03/05/22  Yes [provider]  bumetanide (BUMEX) 1 MG tablet TAKE 1 TABLET BY MOUTH TWICE A DAY Patient not taking: Reported on 07/01/2022 07/17/21   GNicholas Lose MD  fluconazole (DIFLUCAN) 150 MG tablet TAKE 1 TABLET (150 MG TOTAL) BY MOUTH DAILY. IF NEEDED FOR YEAST INFECTION Patient not taking: Reported on 07/01/2022 04/27/22   SCarlyle Basques MD  heparin lock flush 100 UNIT/ML SOLN injection USE 5 MLS AS A HEPLOCK IN PORT A CATH ONCE DAILY AFTER MEDICATION ADMINISTRATION OR AS DIRECTED BY PHYSICIAN. Patient not taking: Reported on 07/01/2022 10/30/21 10/30/22  GNicholas Lose MD  Lancets (ONETOUCH DELICA PLUS [id number] [place]3 (three) times daily. for testing  06/12/21   [provider]  metoprolol succinate (TOPROL-XL) 25 MG 24 hr tablet Take 37.5 mg by mouth daily.  Patient not taking: Reported on 07/01/2022    [provider]  [facility] VERIO test strip 3 (three) times daily. for testing 06/12/21   [provider]  potassium chloride (KLOR-CON) 10 MEQ tablet Take 1 tablet (10 mEq total) by mouth daily for 21 days. Patient not taking: Reported on 07/01/2022 07/15/20 07/01/22  Alfredia Client, PA-C  Sodium Chloride Flush (NORMAL SALINE FLUSH) 0.9 % SOLN FLUSH WITH TWO 10 ML FLUSHES IN PORT BEFORE AND AFTER FLUIDS AND  MEDICATION ADMINISTRATION, TO MAINTAIN PATENCY--USE UP TO 4 TIMES DAILY. Patient not taking: Reported on 07/01/2022 10/30/21 10/30/22  Nicholas Lose, MD     Critical care time: n/a    Freda Jackson, MD [place] Office: [phone]   See Amion for personal pager PCCM on call pager [phone] until 7pm. Please call Elink 7p-7a. [phone]

## 2022-07-03 NOTE — Significant Event (Signed)
Rapid Response Event Note   Reason for Call :  Shortness of breath/tachypnea   Initial Focused Assessment:  Patient on floor-per bedside RN patient lowered herself from the bed to the floor on her own while husband in room. Obvious respiratory distress noted with RR 35-40/minute, unable to speak in complete sentences. Getting nebulizer and received 1 mg IV ativan before arrival. Patient repeating "get my husband" multiple times despite him being infront of her. Assisted back into bed by four nurses and husband. Coached patient to slow breathing with some improvement. Ghimire MD at bedside and additional nebulizer ordered and administered. Patient breathing improved in bed and was able to speak in full sentences at end of rapid.   Breath sounds bilaterally with rhonchi. SpO2 through rapid maintained 96%.     Event Summary:   MD Notified:  Call Time:1243 Arrival Time: 2469 End Time: [place], RN

## 2022-07-03 NOTE — Progress Notes (Signed)
   07/02/22 2237  Assess: MEWS Score  Temp 98.1 F (36.7 C)  BP 123/72  MAP (mmHg) 87  Pulse Rate (!) 117  Resp 17  Level of Consciousness Alert  SpO2 100 %  Assess: MEWS Score  MEWS Temp 0  MEWS Systolic 0  MEWS Pulse 2  MEWS RR 0  MEWS LOC 0  MEWS Score 2  MEWS Score Color Yellow  Assess: if the MEWS score is Yellow or Red  Were vital signs taken at a resting state? Yes  Focused Assessment No change from prior assessment  Does the patient meet 2 or more of the SIRS criteria? Yes  Does the patient have a confirmed or suspected source of infection? Yes  Provider and Rapid Response Notified? Yes  MEWS guidelines implemented *See Row Information* No, previously yellow, continue vital signs every 4 hours  Treat  Pain Scale 0-10  Pain Score 5  Pain Type Acute pain  Pain Location Generalized  Pain Descriptors / Indicators Aching  Pain Frequency Intermittent  Pain Onset Gradual  Patients Stated Pain Goal 2  Pain Intervention(s) Medication (See eMAR)  Notify: Charge Nurse/RN  Name of Charge Nurse/RN Notified Lauren RN  Date Charge Nurse/RN Notified 07/02/22  Time Charge Nurse/RN Notified 2240  Notify: Provider  Provider Name/Title Clarene Essex NP  Date Provider Notified 07/02/22  Time Provider Notified 2240  Method of Notification Page  Notification Reason Change in status  Provider response In department;See new orders  Date of Provider Response 07/02/22  Time of Provider Response 2245  Document  Patient Outcome Stabilized after interventions  Progress note created (see row info) Yes  Assess: SIRS CRITERIA  SIRS Temperature  0  SIRS Pulse 1  SIRS Respirations  0  SIRS WBC 1  SIRS Score Sum  2   Patient transferred from 5 E with SOB and anxiety, Patient had similar episode during the day which required extra nebs. RRRN at bedside and Clarene Essex NP and Respiratory therapist at bedside as well.  Patient was very anxious and SOB , PRN meds given. Patient regain her  baseline breathing and was more relaxed after interventions. Patient remain on 4 Liter [place]. Will continue to monitor patient.

## 2022-07-04 ENCOUNTER — Inpatient Hospital Stay (HOSPITAL_COMMUNITY): Payer: BC Managed Care – PPO

## 2022-07-04 ENCOUNTER — Inpatient Hospital Stay (HOSPITAL_COMMUNITY)
Admit: 2022-07-04 | Discharge: 2022-07-04 | Disposition: A | Discharge status: Home / Self Care | Payer: BC Managed Care – PPO | Attending: Internal Medicine | Admitting: Internal Medicine

## 2022-07-04 ENCOUNTER — Ambulatory Visit (HOSPITAL_COMMUNITY): Payer: BC Managed Care – PPO

## 2022-07-04 DIAGNOSIS — R6520 Severe sepsis without septic shock: Secondary | ICD-10-CM | POA: Diagnosis not present

## 2022-07-04 DIAGNOSIS — J189 Pneumonia, unspecified organism: Secondary | ICD-10-CM | POA: Diagnosis not present

## 2022-07-04 DIAGNOSIS — K2100 Gastro-esophageal reflux disease with esophagitis, without bleeding: Secondary | ICD-10-CM | POA: Diagnosis not present

## 2022-07-04 DIAGNOSIS — N179 Acute kidney failure, unspecified: Secondary | ICD-10-CM | POA: Diagnosis not present

## 2022-07-04 DIAGNOSIS — E2740 Unspecified adrenocortical insufficiency: Secondary | ICD-10-CM | POA: Diagnosis not present

## 2022-07-04 DIAGNOSIS — A419 Sepsis, unspecified organism: Secondary | ICD-10-CM | POA: Diagnosis not present

## 2022-07-04 DIAGNOSIS — E271 Primary adrenocortical insufficiency: Secondary | ICD-10-CM | POA: Diagnosis not present

## 2022-07-04 DIAGNOSIS — B348 Other viral infections of unspecified site: Secondary | ICD-10-CM

## 2022-07-04 DIAGNOSIS — R569 Unspecified convulsions: Secondary | ICD-10-CM

## 2022-07-04 DIAGNOSIS — J9611 Chronic respiratory failure with hypoxia: Secondary | ICD-10-CM | POA: Diagnosis not present

## 2022-07-04 LAB — RESPIRATORY PANEL BY PCR

## 2022-07-04 LAB — GLUCOSE, CAPILLARY
Glucose-Capillary: 87 mg/dL (ref 70–99)
Glucose-Capillary: 96 mg/dL (ref 70–99)
Glucose-Capillary: 108 mg/dL — ABNORMAL HIGH (ref 70–99)
Glucose-Capillary: 188 mg/dL — ABNORMAL HIGH (ref 70–99)
Glucose-Capillary: 165 mg/dL — ABNORMAL HIGH (ref 70–99)

## 2022-07-04 LAB — LEGIONELLA PNEUMOPHILA SEROGP 1 UR AG: L. pneumophila Serogp 1 Ur Ag: NEGATIVE

## 2022-07-04 MED ORDER — CHLORHEXIDINE GLUCONATE CLOTH 2 % EX PADS
6.0000 | MEDICATED_PAD | Freq: Every day | CUTANEOUS | Status: DC
  Administered 2022-07-04 – 2022-07-08 (×4): 6 via TOPICAL

## 2022-07-04 MED ORDER — PREDNISONE 20 MG PO TABS: 20.0000 mg | ORAL_TABLET | Freq: Every day | ORAL | Status: DC

## 2022-07-04 MED ORDER — PREDNISONE 5 MG PO TABS: 30.0000 mg | ORAL_TABLET | Freq: Every day | ORAL | Status: DC

## 2022-07-04 MED ORDER — PREDNISONE 5 MG PO TABS: 10.0000 mg | ORAL_TABLET | Freq: Every day | ORAL | Status: DC

## 2022-07-04 MED ORDER — LIDOCAINE HCL (PF) 2 % IJ SOLN
2.0000 mL | Freq: Once | INTRAMUSCULAR | Status: AC
  Administered 2022-07-04: 2 mL
  Filled 2022-07-04: qty 2

## 2022-07-04 MED ORDER — GUAIFENESIN 100 MG/5ML PO LIQD
15.0000 mL | Freq: Four times a day (QID) | ORAL | Status: DC
  Administered 2022-07-04 – 2022-07-08 (×18): 15 mL via ORAL
  Filled 2022-07-04 (×18): qty 20

## 2022-07-04 MED ORDER — HYDROMORPHONE HCL 1 MG/ML IJ SOLN
0.2500 mg | INTRAMUSCULAR | Status: DC | PRN
  Administered 2022-07-04 – 2022-07-08 (×19): 0.25 mg via INTRAVENOUS
  Filled 2022-07-04 (×19): qty 1

## 2022-07-04 MED ORDER — ORAL CARE MOUTH RINSE: 15.0000 mL | OROMUCOSAL | Status: DC | PRN

## 2022-07-04 MED ORDER — PREDNISONE 10 MG PO TABS: 10.0000 mg | ORAL_TABLET | Freq: Every day | ORAL | Status: DC

## 2022-07-04 MED ORDER — PREDNISONE 20 MG PO TABS
40.0000 mg | ORAL_TABLET | Freq: Every day | ORAL | Status: AC
  Administered 2022-07-04 – 2022-07-07 (×4): 40 mg via ORAL
  Filled 2022-07-04 (×4): qty 2

## 2022-07-04 MED ORDER — PREDNISONE 5 MG PO TABS: 5.0000 mg | ORAL_TABLET | Freq: Every day | ORAL | Status: DC

## 2022-07-04 MED ORDER — PREDNISONE 20 MG PO TABS
30.0000 mg | ORAL_TABLET | Freq: Every day | ORAL | Status: DC
  Administered 2022-07-08 – 2022-07-09 (×2): 30 mg via ORAL
  Filled 2022-07-04 (×2): qty 1

## 2022-07-04 MED ORDER — PREDNISONE 20 MG PO TABS: 40.0000 mg | ORAL_TABLET | Freq: Every day | ORAL | Status: DC

## 2022-07-04 MED ORDER — LORAZEPAM 2 MG/ML IJ SOLN
0.5000 mg | Freq: Three times a day (TID) | INTRAMUSCULAR | Status: DC | PRN
  Administered 2022-07-04 – 2022-07-09 (×11): 0.5 mg via INTRAVENOUS
  Filled 2022-07-04 (×11): qty 1

## 2022-07-04 MED ORDER — DIPHENHYDRAMINE HCL 50 MG/ML IJ SOLN
12.5000 mg | Freq: Four times a day (QID) | INTRAMUSCULAR | Status: DC | PRN
  Administered 2022-07-04 – 2022-07-09 (×19): 12.5 mg via INTRAVENOUS
  Filled 2022-07-04 (×19): qty 1

## 2022-07-04 NOTE — Progress Notes (Signed)
Subjective:  Patient has been moved to the ICU apparently she had either seizures or pseudoseizures she says she is having trouble recalling events   Antibiotics:  Anti-infectives (From admission, onward)    Start     Dose/Rate Route Frequency Ordered Stop   07/03/22 1430  amoxicillin-clavulanate (AUGMENTIN) 400-57 MG/5ML suspension 875 mg        875 mg Oral Every 12 hours 07/03/22 1346 07/07/22 2359   07/03/22 1200  amoxicillin-clavulanate (AUGMENTIN) 600-42.9 MG/5ML suspension 876 mg  Status:  Discontinued        875 mg of amoxicillin Oral 2 times daily 07/03/22 1006 07/03/22 1346   07/03/22 1000  azithromycin (ZITHROMAX) tablet 500 mg  Status:  Discontinued        500 mg Oral Daily 07/02/22 1220 07/03/22 1005   07/02/22 1400  cefTRIAXone (ROCEPHIN) 2 g in sodium chloride 0.9 % 100 mL IVPB  Status:  Discontinued        2 g 200 mL/hr over 30 Minutes Intravenous Every 24 hours 07/02/22 1100 07/03/22 1005   07/02/22 1000  azithromycin (ZITHROMAX) tablet 500 mg  Status:  Discontinued        500 mg Oral Daily 07/01/22 1535 07/02/22 1220   07/01/22 2100  ceFEPIme (MAXIPIME) 2 g in sodium chloride 0.9 % 100 mL IVPB  Status:  Discontinued        2 g 200 mL/hr over 30 Minutes Intravenous Every 8 hours 07/01/22 1543 07/02/22 1100   07/01/22 1830  fluconazole (DIFLUCAN) tablet 150 mg       Note to Pharmacy: TAKE 1 TABLET (150 MG TOTAL) BY MOUTH DAILY. IF NEEDED FOR YEAST INFECTION     150 mg Oral  Once 07/01/22 1718     07/01/22 1245  ceFEPIme (MAXIPIME) 2 g in sodium chloride 0.9 % 100 mL IVPB        2 g 200 mL/hr over 30 Minutes Intravenous  Once 07/01/22 1234 07/01/22 1352   07/01/22 1230  azithromycin (ZITHROMAX) 500 mg in sodium chloride 0.9 % 250 mL IVPB        500 mg 250 mL/hr over 60 Minutes Intravenous  Once 07/01/22 1225 07/01/22 1513       Medications: Scheduled Meds:  amoxicillin-clavulanate  875 mg Oral Q12H   arformoterol  15 mcg Nebulization BID    budesonide (PULMICORT) nebulizer solution  0.5 mg Nebulization BID   bumetanide  1 mg Oral BID   buPROPion  150 mg Oral Daily   Chlorhexidine Gluconate Cloth  6 each Topical Daily   enoxaparin (LOVENOX) injection  40 mg Subcutaneous Q24H   escitalopram  20 mg Oral QPM   famotidine  20 mg Oral QHS   fluconazole  150 mg Oral Once   guaiFENesin  15 mL Oral QID   insulin aspart  0-5 Units Subcutaneous QHS   insulin aspart  0-9 Units Subcutaneous TID WC   lamoTRIgine  150 mg Oral QHS   levothyroxine  100 mcg Oral q morning   montelukast  10 mg Oral QHS   pantoprazole  40 mg Oral Daily   predniSONE  40 mg Oral Q breakfast   Followed by   Derrill Memo ON 07/08/2022] predniSONE  30 mg Oral Q breakfast   Followed by   Derrill Memo ON 07/12/2022] predniSONE  20 mg Oral Q breakfast   Followed by   Derrill Memo ON 07/16/2022] predniSONE  10 mg Oral Q breakfast   Followed by   [  START ON 07/20/2022] predniSONE  5 mg Oral Q breakfast   revefenacin  175 mcg Nebulization Daily   sodium chloride HYPERTONIC  4 mL Nebulization BID   topiramate  150 mg Oral QHS   Continuous Infusions: PRN Meds:.benzonatate, diphenhydrAMINE, dronabinol, guaiFENesin-dextromethorphan, HYDROmorphone (DILAUDID) injection, ibuprofen, ipratropium-albuterol, LORazepam, mouth rinse, Rimegepant Sulfate, zolpidem    Objective: Weight change: 8.3 kg  Intake/Output Summary (Last 24 hours) at 07/04/2022 1118 Last data filed at 07/04/2022 0541 Gross per 24 hour  Intake 920 ml  Output --  Net 920 ml    Blood pressure (!) 144/73, pulse (!) 117, temperature (!) 101 F (38.3 C), temperature source Oral, resp. rate 16, height _0  (1.651 m), weight 81.3 kg, SpO2 95 %. Temp:  [97.7 F (36.5 C)-101 F (38.3 C)] 101 F (38.3 C) (10/04 0902) Pulse Rate:  [99-123] 117 (10/04 0926) Resp:  [16-28] 16 (10/04 0926) BP: (94-148)/(40-89) 144/73 (10/04 0902) SpO2:  [94 %-100 %] 95 % (10/04 1044) Weight:  [81.3 kg-95.7 kg] 81.3 kg (10/04  0902)  Physical Exam: Physical Exam Constitutional:      General: She is not in acute distress.    Appearance: She is well-developed. She is not diaphoretic.  HENT:     Head: Normocephalic and atraumatic.     Right Ear: External ear normal.     Left Ear: External ear normal.     Mouth/Throat:     Pharynx: No oropharyngeal exudate.  Eyes:     General: No scleral icterus.    Conjunctiva/sclera: Conjunctivae normal.     Pupils: Pupils are equal, round, and reactive to light.  Cardiovascular:     Rate and Rhythm: Regular rhythm. Tachycardia present.     Heart sounds: Normal heart sounds. No murmur heard.    No friction rub. No gallop.  Pulmonary:     Effort: Pulmonary effort is normal. No respiratory distress.     Breath sounds: Rhonchi present. No wheezing or rales.  Abdominal:     General: Bowel sounds are normal. There is no distension.     Palpations: Abdomen is soft.     Tenderness: There is no abdominal tenderness. There is no rebound.  Musculoskeletal:        General: No tenderness. Normal range of motion.  Lymphadenopathy:     Cervical: No cervical adenopathy.  Skin:    General: Skin is warm and dry.     Coloration: Skin is not pale.     Findings: No erythema or rash.  Neurological:     General: No focal deficit present.     Mental Status: She is alert and oriented to person, place, and time.     Motor: No abnormal muscle tone.     Coordination: Coordination normal.  Psychiatric:        Mood and Affect: Mood is anxious and depressed. Affect is tearful.        Behavior: Behavior normal.        Thought Content: Thought content normal.        Cognition and Memory: Cognition and memory normal.        Judgment: Judgment normal.      CBC:    BMET Recent Labs    07/01/22 1336 07/03/22 0529  NA 139 139  K 3.0* 4.2  CL 100 106  CO2 26 23  GLUCOSE 138* 160*  BUN 25* 30*  CREATININE 1.55* 1.10*  CALCIUM 8.9 9.3      Liver Panel  Recent Labs  07/01/22 1336  PROT 7.6  ALBUMIN 4.0  AST 32  ALT 29  ALKPHOS 70  BILITOT 0.7        Sedimentation Rate No results for input(s): "ESRSEDRATE" in the last 72 hours. C-Reactive Protein No results for input(s): "CRP" in the last 72 hours.  Micro Results: Recent Results (from the past 720 hour(s))  Culture, blood (routine x 2)     Status: None (Preliminary result)   Collection Time: 07/01/22 12:57 PM   Specimen: BLOOD  Result Value Ref Range Status   Specimen Description   Final    BLOOD RIGHT ANTECUBITAL Performed at [place] [address] [place], [place] [address]    Special Requests   Final    BOTTLES DRAWN AEROBIC ONLY Blood Culture adequate volume Performed at [facility] [address] [place], [place] [address]    Culture   Final    NO GROWTH 3 DAYS Performed at [facility], [place] [address], [place], [place] [address]    Report Status PENDING  Incomplete  Culture, blood (routine x 2)     Status: None (Preliminary result)   Collection Time: 07/01/22 12:57 PM   Specimen: BLOOD  Result Value Ref Range Status   Specimen Description   Final    BLOOD LEFT ANTECUBITAL Performed at [facility] [address] [place], [place] [address]    Special Requests   Final    BOTTLES DRAWN AEROBIC AND ANAEROBIC Blood Culture adequate volume Performed at [facility] [address] [place], [place] [address]    Culture   Final    NO GROWTH 3 DAYS Performed at [facility], [place] [address], [place], [place] [address]    Report Status PENDING  Incomplete  Resp Panel by RT-PCR (Flu A&B, Covid) Peripheral     Status: None   Collection Time: 07/01/22  1:39 PM   Specimen: Peripheral; Nasal Swab  Result Value Ref Range Status   SARS Coronavirus 2 by RT PCR NEGATIVE NEGATIVE Final    Comment: (NOTE) SARS-CoV-2 target nucleic acids are NOT DETECTED.  The SARS-CoV-2 RNA is  generally detectable in upper respiratory specimens during the acute phase of infection. The lowest concentration of SARS-CoV-2 viral copies this assay can detect is 138 copies/mL. A negative result does not preclude SARS-Cov-2 infection and should not be used as the sole basis for treatment or other patient management decisions. A negative result may occur with  improper specimen collection/handling, submission of specimen other than nasopharyngeal swab, presence of viral mutation(s) within the areas targeted by this assay, and inadequate number of viral copies(<138 copies/mL). A negative result must be combined with clinical observations, patient history, and epidemiological information. The expected result is Negative.  Fact Sheet for Patients:  EntrepreneurPulse.com.au  Fact Sheet for Healthcare Providers:  IncredibleEmployment.be  This test is no t yet approved or cleared by the [place] FDA and  has been authorized for detection and/or diagnosis of SARS-CoV-2 by FDA under an Emergency Use Authorization (EUA). This EUA will remain  in effect (meaning this test can be used) for the duration of the COVID-19 declaration under Section 564(b)(1) of the Act, 21 U.S.C.section 360bbb-3(b)(1), unless the authorization is terminated  or revoked sooner.       Influenza A by PCR NEGATIVE NEGATIVE Final   Influenza B by PCR NEGATIVE NEGATIVE Final    Comment: (NOTE) The Xpert Xpress SARS-CoV-2/FLU/RSV plus assay is intended  as an aid in the diagnosis of influenza from Nasopharyngeal swab specimens and should not be used as a sole basis for treatment. Nasal washings and aspirates are unacceptable for Xpert Xpress SARS-CoV-2/FLU/RSV testing.  Fact Sheet for Patients: EntrepreneurPulse.com.au  Fact Sheet for Healthcare Providers: IncredibleEmployment.be  This test is not yet approved or cleared by the [place] FDA and has been authorized for detection and/or diagnosis of SARS-CoV-2 by FDA under an Emergency Use Authorization (EUA). This EUA will remain in effect (meaning this test can be used) for the duration of the COVID-19 declaration under Section 564(b)(1) of the Act, 21 U.S.C. section 360bbb-3(b)(1), unless the authorization is terminated or revoked.  Performed at [facility], [place] [address], [place], [place] [address]   Expectorated Sputum Assessment w Gram Stain, Rflx to Resp Cult     Status: None   Collection Time: 07/03/22  6:36 AM   Specimen: Expectorated Sputum  Result Value Ref Range Status   Specimen Description EXPECTORATED SPUTUM  Final   Special Requests NONE  Final   Sputum evaluation   Final    THIS SPECIMEN IS ACCEPTABLE FOR SPUTUM CULTURE Performed at [facility], [place] [address] [place], [place] [address]    Report Status 07/03/2022 FINAL  Final  Culture, Respiratory w Gram Stain     Status: None (Preliminary result)   Collection Time: 07/03/22  6:36 AM  Result Value Ref Range Status   Specimen Description   Final    EXPECTORATED SPUTUM Performed at [facility] [address], [place], [place] [address]    Special Requests   Final    NONE Reflexed from [id number] Performed at [facility], [place] [address], [place], [place] [address]    Gram Stain   Final    FEW WBC PRESENT,BOTH PMN AND MONONUCLEAR FEW BUDDING YEAST SEEN RARE GRAM POSITIVE RODS Performed at [facility], [place] [address] [place], [place] [address]    Culture PENDING  Incomplete   Report Status PENDING  Incomplete  Respiratory (~20 pathogens) panel by PCR     Status: Abnormal   Collection Time: 07/03/22  6:55 PM   Specimen: Nasopharyngeal Swab; Respiratory  Result Value Ref Range Status   Adenovirus NOT DETECTED NOT DETECTED Final   Coronavirus 229E NOT DETECTED NOT DETECTED Final    Comment: (NOTE) The  Coronavirus on the Respiratory Panel, DOES NOT test for the novel  Coronavirus (2019 nCoV)    Coronavirus HKU1 NOT DETECTED NOT DETECTED Final   Coronavirus NL63 NOT DETECTED NOT DETECTED Final   Coronavirus OC43 NOT DETECTED NOT DETECTED Final   Metapneumovirus NOT DETECTED NOT DETECTED Final   Rhinovirus / Enterovirus DETECTED (A) NOT DETECTED Final   Influenza A NOT DETECTED NOT DETECTED Final   Influenza B NOT DETECTED NOT DETECTED Final   Parainfluenza Virus 1 NOT DETECTED NOT DETECTED Final   Parainfluenza Virus 2 NOT DETECTED NOT DETECTED Final   Parainfluenza Virus 3 NOT DETECTED NOT DETECTED Final   Parainfluenza Virus 4 NOT DETECTED NOT DETECTED Final   Respiratory Syncytial Virus NOT DETECTED NOT DETECTED Final   Bordetella pertussis NOT DETECTED NOT DETECTED Final   Bordetella Parapertussis NOT DETECTED NOT DETECTED Final   Chlamydophila pneumoniae NOT DETECTED NOT DETECTED Final   Mycoplasma pneumoniae NOT DETECTED NOT DETECTED Final    Comment: Performed at [facility], [place]. [address] [place], [place] [address]  MRSA Next Gen by PCR, Nasal  Status: None   Collection Time: 07/03/22  6:58 PM   Specimen: Nasal Mucosa; Nasal Swab  Result Value Ref Range Status   MRSA by PCR Next Gen NOT DETECTED NOT DETECTED Final    Comment: (NOTE) The GeneXpert MRSA Assay (FDA approved for NASAL specimens only), is one component of a comprehensive MRSA colonization surveillance program. It is not intended to diagnose MRSA infection nor to guide or monitor treatment for MRSA infections. Test performance is not FDA approved in patients less than 71 years old. Performed at [facility], [place] [address] [place] [address], [place], [place] [address]     Studies/Results: DG CHEST PORT 1 VIEW  Result Date: 07/02/2022 CLINICAL DATA:  Shortness of breath EXAM: PORTABLE CHEST 1 VIEW COMPARISON:  07/01/2022 FINDINGS: Single frontal view of the chest demonstrates a stable cardiac  silhouette. There is persistent retrocardiac consolidation, unchanged since prior exam. No effusion or pneumothorax. No acute bony abnormalities. IMPRESSION: 1. Stable left basilar consolidation, which may reflect infection or aspiration. Electronically Signed   By: Randa Ngo M.D.   On: 07/02/2022 18:20      Assessment/Plan:  INTERVAL HISTORY:  yet another rapid response for now possible seizures   Principal Problem:   Sepsis ([place]) Active Problems:   Hx of Hodgkins lymphoma   Adrenal insufficiency (HCC)   Hypertension   Raynaud phenomenon   GERD (gastroesophageal reflux disease)   Hyperlipidemia   Chronic respiratory failure with hypoxia (HCC)   AKI (acute kidney injury) ([place])   Community acquired pneumonia   Severe persistent asthma with acute exacerbation   Anxiety    [name] is a 51 y.o. female with  hx of multiple malignancies that she has survived, COPD, prior bactermias in settinf or port now admitted with pneumonia + asthma exacerbation  #1 Pneumonia:  Rhinovirus PCR + so could be viral pneumonia +/- bacterial superinfection  Plan on giving her 10 days of total antibacterial antibiotics using liquid Augmentin to finish this Droplet precautions  #2 asthma: Followed by CCM in the ICU now on steroids  #3  Anxiety: There is a significant component of anxiety driving much of her complaints.  Wonder if she would benefit from psychiatry consult.  #4 Seizures: not clear to me whether she really had one or not   I spent 52 minutes with the patient including than 50% of the time in face to face counseling of the patient re her pneumonia, her asthma, seizures personally reviewing CXR along with review of medical records in preparation for the visit and during the visit and in coordination of her care.   I will sign off for now please call with further questions.   LOS: 3 days   Alcide Evener 07/04/2022, 11:18 AM

## 2022-07-04 NOTE — Consult Note (Signed)
NAME:  [name], MRN:  [id number], DOB:  May 20, 1971, LOS: 3 ADMISSION DATE:  07/01/2022, CONSULTATION DATE:  07/03/22 REFERRING MD:  Barb Merino, MD CHIEF COMPLAINT:  Bronchospasms   History of Present Illness:  [name] is a 51 year old woman with history of hodgkin's lymphoma, breast cancer, cervical cancer, thyroid cancer, hypothyroidism, addison's disease, GERD, asthma, chronic hypoxemic respiratory failure, HFpEF, hypertension, anxiety/depression, and chronic pain syndrome who was admitted for sepsis due to pneumonia on 07/01/22. PCCM is consulted for ongoing bronchospasms.   She has dyspnea, wheezing, intermittent sensation of her throat narrowing. She had ENT evaluation last year and flexible laryngoscopy was unremarkable. She reports her symptoms are worse than her usual asthma exacerbation. She reports fevers earlier on during the onset of these symptoms.  Respiratory culture is showing few budding yeast and rare gram positive rods. Influenza and covid negative on admission. CXR on admission showed left lower lobe opacity. She was started on cefepime and azithromycin, changed cefepime to ceftriaxone and now taking augmentin.   She was last seen in pulmonary clinic 02/02/22 with transition to Advair 230-55mg 2 puffs twice daily. She reports being on nucala in the past with improvement of her breathing.  Pertinent  Medical History   Past Medical History:  Diagnosis Date   Addison's disease ([facility] [place]    Adrenal insufficiency ([facility]    Anemia    Anxiety    Aortic stenosis    Aortic stenosis    Appendicitis 12/19/09   Appendicitis    Breast cancer (HCC)    STATUS POST BILATERAL MASTECTOMY. STATUS POST RECONSTRUCTION. SHE HAD SILICONE BREAST IMPLANTS AND THE LEFT IMPLANT IS LEAKING SLIGHTLY   Cellulitis of right middle finger 11/07/2018   Cervical cancer ([place] 12/23/2018   Chest pain    CHF with right heart failure ([facility] 04/17/2017   Chronic respiratory failure with hypoxia (HCC)  12/23/2018   Cough variant asthma 04/13/2019   Depression    GERD (gastroesophageal reflux disease)    takes Dexilant and carafate and gi coctail    Headache    migraines on a daily and monthly regimen    Heart murmur    History of kidney stones    Hodgkin lymphoma ([facility] [place]    STATUS POST MANTLE RADIATION   Hodgkin's lymphoma ([facility]    1987   Hypertension    Hypoxia    Necrotizing fasciitis ([facility] 12/23/2018   Non-ischemic cardiomyopathy ([facility]    Osteoporosis    Palpitations    [facility] 12/23/2018   Pneumonia    PONV (postoperative nausea and vomiting)    Pre-diabetes    per pt; no meds   Pulmonary hypertension ([facility] 12/23/2018   Raynaud phenomenon    Right heart failure ([facility] 04/17/2017   Seizures ([facility]    last febrile seizure was approx 3 weeks ago per report on 12/01/2020   Sleep apnea    upcoming sleep study per pt    Supplemental oxygen dependent    3 liters   SVT (supraventricular tachycardia)    Tachycardia    Thyroid cancer ([facility]    STATUS POST SURGICAL REMOVAL-CURRENT ON THYROID REPLACEMENT    Significant Hospital Events: Including procedures, antibiotic start and stop dates in addition to other pertinent events   10/1 admitted 10/3 PCCM consulted for recurrent bronchospasms 10/4 transferred to step down unit for rapid response  Interim History / Subjective:   Patient transferred to step down this morning due to loss of consciousness after episode of  shortness of breath.   Objective   Blood pressure (!) 145/85, pulse (!) 123, temperature 98.8 F (37.1 C), temperature source Oral, resp. rate (!) 26, height _0  (1.651 m), weight 95.7 kg, SpO2 99 %.        Intake/Output Summary (Last 24 hours) at 07/04/2022 0924 Last data filed at 07/04/2022 0541 Gross per 24 hour  Intake 920 ml  Output --  Net 920 ml   Filed Weights   07/01/22 1722 07/03/22 0435 07/04/22 0541  Weight: 82.2 kg 87.4 kg 95.7 kg    Examination: General: middle aged woman, mild  to moderate distress HENT: [place]/AT, upper airway wheeze, moist mucous membranes Lungs: no wheezing, no rales Cardiovascular: tachycardic, no murmurs Abdomen: soft, non-tender, non-distended, BS+ Extremities: warm, trace edema Neuro: alert, oriented, moving all extremities GU: n/a  Resolved Hospital Problem list     Assessment & Plan:  Viral Pneumonia/Bronchitis, rhinovirus+ Severe Persistent Asthma with Exacerbation Chronic Hypoxemic Respiratory Failure Adrenal Insufficiency Anxiety/Depression GERD  Discussion: Patient has rhinovirus pneumonia/bronchitis with severe asthma exacerbation. She has upper airway wheeze concerning for questionable upper airway inflammation vs vocal cord dysfunction. She has significant anxiety component to her presentation as well.   Plan: - Transition from high dose IV steroids to prednisone taper incase adding to anxiety symptoms - Continue budesonide + brovana twice daily and yupelri daily - PRN duonebs q4hrs - PRN lidocaine nebulizer treatment for cough - She is on pantoprazole 91m daily, added 267mfamotidine at bedtime 10/3 - Recommend eating at least 2 hours before bed, sleep with head of bed 30 degrees or higher - ok to continue augmentin antibiotic therapy at this time  PCCM will continue to follow   Best Practice (right click and "Reselect all SmartList Selections" daily)   Per primary team  Labs   CBC: Recent Labs  Lab 07/01/22 1336 07/03/22 0529  WBC 16.5* 17.8*  NEUTROABS 14.1* 15.6*  HGB 9.8* 8.7*  HCT 32.2* 29.4*  MCV 80.7 82.8  PLT 328 328     Basic Metabolic Panel: Recent Labs  Lab 07/01/22 1336 07/03/22 0529  NA 139 139  K 3.0* 4.2  CL 100 106  CO2 26 23  GLUCOSE 138* 160*  BUN 25* 30*  CREATININE 1.55* 1.10*  CALCIUM 8.9 9.3  MG  --  2.3  PHOS  --  3.4    GFR: Estimated Creatinine Clearance: 69.3 mL/min (A) (by C-G formula based on SCr of 1.1 mg/dL (H)). Recent Labs  Lab 07/01/22 1336  07/01/22 1512 07/03/22 0529  WBC 16.5*  --  17.8*  LATICACIDVEN  --  3.0*  --      Liver Function Tests: Recent Labs  Lab 07/01/22 1336  AST 32  ALT 29  ALKPHOS 70  BILITOT 0.7  PROT 7.6  ALBUMIN 4.0    Recent Labs  Lab 07/01/22 1336  LIPASE 44    No results for input(s): "AMMONIA" in the last 168 hours.  ABG    Component Value Date/Time   PHART 7.36 07/02/2022 1740   PCO2ART 40 07/02/2022 1740   PO2ART 104 07/02/2022 1740   HCO3 22.6 07/02/2022 1740   TCO2 21 (L) 04/02/2018 1343   TCO2 22 04/02/2018 1343   ACIDBASEDEF 2.9 (H) 07/02/2022 1740   O2SAT 100 07/02/2022 1740     Coagulation Profile: No results for input(s): "INR", "PROTIME" in the last 168 hours.  Cardiac Enzymes: No results for input(s): "CKTOTAL", "CKMB", "CKMBINDEX", "TROPONINI" in the last 168 hours.  HbA1C:  Hgb A1c MFr Bld  Date/Time Value Ref Range Status  02/09/2022 12:02 PM 6.3 (H) 4.8 - 5.6 % Final    Comment:    (NOTE) Pre diabetes:          5.7%-6.4%  Diabetes:              >6.4%  Glycemic control for   <7.0% adults with diabetes   12/05/2020 02:55 PM 6.8 (H) 4.8 - 5.6 % Final    Comment:    (NOTE) Pre diabetes:          5.7%-6.4%  Diabetes:              >6.4%  Glycemic control for   <7.0% adults with diabetes     CBG: Recent Labs  Lab 07/03/22 1123 07/03/22 1555 07/03/22 2139 07/04/22 0745 07/04/22 0830  GLUCAP 237* 217* 112* 87 96      Critical care time: n/a    Freda Jackson, MD [facility] Office: [phone]   See Amion for personal pager PCCM on call pager [phone] until 7pm. Please call Elink 7p-7a. [phone]

## 2022-07-04 NOTE — Progress Notes (Signed)
PROGRESS NOTE    [name]  GNO:[id number] DOB: 1970-12-26 DOA: 07/01/2022 PCP: Su Grand, MD    Brief Narrative:  51 year old with history of extensive multiple cancers, chronic hypoxemia on 4 L oxygen, Addison's disease, GERD, migraine, adrenal insufficiency, hypothyroidism anxiety depression chronic pain syndrome and recurrent admissions, recent bacteremia presented with feeling weak, fatigue and fever at home, shortness of breath not improved with inhalers at home.  In the emergency room temperature 102.3, WBC count 16.5, blood pressures 90/58, lactic acid 3.  Chest x-ray with left lower lobe infiltrate.  Admitted and treated as sepsis due to pneumonia.  Rapid responses 10/2 evening and night, 10/3 for intractable cough and bronchospasm.  Anxiety attacks after starting cough. Rapid response 10/4 morning for rigors that was alerted as seizures.  See below. Viral panel positive for rhinovirus.   Assessment & Plan:   Sepsis present on admission secondary to left lower lobe pneumonia, immunocompromised patient.  Hypoxemia, lactic acidosis and leukocytosis. COVID-19 influenza negative.  Blood cultures and sputum cultures negative. Rhinovirus positive. Treated with Rocephin and azithromycin.  Now on Augmentin.  We will continue 5 days of therapy. Augmentin 875 mg as liquid formulary because it is difficult for her to take pills.  Chest physiotherapy, incentive spirometry, deep breathing exercises, sputum induction, mucolytic's and bronchodilators. Sputum cultures, blood cultures, Legionella and streptococcal antigen pending and negative so far. Supplemental oxygen to keep saturations more than 90%.  Continue mobility. Extensive infectious history, infectious team following. Leukocytosis is likely secondary to high-dose steroids. Exacerbation of her underlying restrictive lung disease due to rhinovirus infection.  Supportive treatment.  Pulmonary following. Tapering dose of  prednisone is started today.  Started on Pulmicort.  Acute renal failure: Treated with IV fluids.  Hemodynamics improved.  Levels improved.   She will go back on Bumex.  Chronic hypoxemic respiratory failure: Continuing saturations on 3 to 4 L oxygen.  At baseline.  Saturation will need to be 90 to 92%.  Current diastolic heart failure pulm beta-blockers diuretics.  Resumed beta-blockers and Bumex.   Adrenal insufficiency: Currently on stress dose of steroids.  Home dose Solu-Cortef 20/10 mg daily. Prednisone taper and ultimately go back on home doses.  Anxiety, depression, insomnia.  Multiple pain issues and nausea. Symptomatic treatment. Resume Klonopin, Wellbutrin, Marinol, Lexapro, Lamictal and Ambien. Decrease use of IV Dilaudid and Benadryl, decrease use of IV Ativan.  Hyperglycemia associated with steroid use: Cover with sliding scale insulin.  Patient on Januvia at home.  Hypokalemia: Adequately replaced.  Shaking/tremors: Rapid response called 10/4 morning.  Attended at the bedside.  Patient had multiple bronchospasm spells yesterday.  She is on IV dose of narcotics and benzodiazepines along with Benadryl. She was noted to have shaking and tremors with episodes of unresponsiveness today morning, on examination at bedside breathing normal, 100% on 3 L oxygen, patient is with flickering eyes, she will withdraw to pain, tone normal.  No evidence of tonic-clonic seizure. This is probably shaking tremors, polypharmacy causing altered mental status. Will check CT scan of the head and EEG Consultation with neurology on the phone, advised to avoid polypharmacy and monitor.  Currently no indication for seizure medications.    DVT prophylaxis: enoxaparin (LOVENOX) injection 40 mg Start: 07/01/22 2200   Code Status: DNR with full scope of treatment Family Communication: None today. Disposition Plan: Status is: Inpatient Remains inpatient appropriate because: Significant  bronchospasm, frequent rapid responses.     Consultants:  Infectious disease  Procedures:  None  Antimicrobials:  Cefepime and azithromycin 10/1--- 10/3 Augmentin 10/3---   Subjective:  Patient seen and examined.  They called rapid response on this patient in the morning.  I attended with the rapid response team.  She was shaking, tremors, increased tone and not talking.  No focal neurological deficits.  No evidence of tonic-clonic seizure.  No incontinence.  Telemetry with normal sinus rhythm and tachycardia. Monitored, symptomatically treated.  Reevaluated and patient is stabilized. Patient was asking me whether her forgetfulness is going to be permanent, we discussed about avoiding polypharmacy.  Objective: Vitals:   07/04/22 0924 07/04/22 0925 07/04/22 0926 07/04/22 1044  BP:      Pulse: (!) 118 (!) 118 (!) 117   Resp: (!) 22 (!) 24 16   Temp:      TempSrc:      SpO2: 97% 96% 96% 95%  Weight:      Height:        Intake/Output Summary (Last 24 hours) at 07/04/2022 1118 Last data filed at 07/04/2022 0541 Gross per 24 hour  Intake 920 ml  Output --  Net 920 ml   Filed Weights   07/03/22 0435 07/04/22 0541 07/04/22 0902  Weight: 87.4 kg 95.7 kg 81.3 kg    Examination:  General exam: During the event.  Appears shaky and tremulous.  Eyes closed. Respiratory system: Poor lateral air entry.  Not in any distress.  She had good bilateral air entry. Cardiovascular system: S1 & S2 heard, RRR.  Tachycardic. Gastrointestinal system: Abdomen is nondistended, soft and nontender. No organomegaly or masses felt. Normal bowel sounds heard. Central nervous system: Eyes closed, flickering of the eyelids, shaking but no fits or seizures. Extremities: Symmetric 5 x 5 power.  Can move all extremities after the events. Skin: 2 punctate lesions on hands and legs.    Data Reviewed: I have personally reviewed following labs and imaging studies  CBC: Recent Labs  Lab 07/01/22 1336  07/03/22 0529  WBC 16.5* 17.8*  NEUTROABS 14.1* 15.6*  HGB 9.8* 8.7*  HCT 32.2* 29.4*  MCV 80.7 82.8  PLT 328 283   Basic Metabolic Panel: Recent Labs  Lab 07/01/22 1336 07/03/22 0529  NA 139 139  K 3.0* 4.2  CL 100 106  CO2 26 23  GLUCOSE 138* 160*  BUN 25* 30*  CREATININE 1.55* 1.10*  CALCIUM 8.9 9.3  MG  --  2.3  PHOS  --  3.4   GFR: Estimated Creatinine Clearance: 63.7 mL/min (A) (by C-G formula based on SCr of 1.1 mg/dL (H)). Liver Function Tests: Recent Labs  Lab 07/01/22 1336  AST 32  ALT 29  ALKPHOS 70  BILITOT 0.7  PROT 7.6  ALBUMIN 4.0   Recent Labs  Lab 07/01/22 1336  LIPASE 44   No results for input(s): "AMMONIA" in the last 168 hours. Coagulation Profile: No results for input(s): "INR", "PROTIME" in the last 168 hours. Cardiac Enzymes: No results for input(s): "CKTOTAL", "CKMB", "CKMBINDEX", "TROPONINI" in the last 168 hours. BNP (last 3 results) No results for input(s): "PROBNP" in the last 8760 hours. HbA1C: No results for input(s): "HGBA1C" in the last 72 hours. CBG: Recent Labs  Lab 07/03/22 1123 07/03/22 1555 07/03/22 2139 07/04/22 0745 07/04/22 0830  GLUCAP 237* 217* 112* 87 96   Lipid Profile: No results for input(s): "CHOL", "HDL", "LDLCALC", "TRIG", "CHOLHDL", "LDLDIRECT" in the last 72 hours. Thyroid Function Tests: No results for input(s): "TSH", "T4TOTAL", "FREET4", "T3FREE", "THYROIDAB" in the last 72 hours. Anemia Panel: No results for  input(s): "VITAMINB12", "FOLATE", "FERRITIN", "TIBC", "IRON", "RETICCTPCT" in the last 72 hours. Sepsis Labs: Recent Labs  Lab 07/01/22 1512  LATICACIDVEN 3.0*    Recent Results (from the past 240 hour(s))  Culture, blood (routine x 2)     Status: None (Preliminary result)   Collection Time: 07/01/22 12:57 PM   Specimen: BLOOD  Result Value Ref Range Status   Specimen Description   Final    BLOOD RIGHT ANTECUBITAL Performed at [facility] [address], [place], [place] [address]    Special Requests   Final    BOTTLES DRAWN AEROBIC ONLY Blood Culture adequate volume Performed at [facility] [address] [place], [place] [address]    Culture   Final    NO GROWTH 3 DAYS Performed at [facility], [place] [address], [place], [place] [address]    Report Status PENDING  Incomplete  Culture, blood (routine x 2)     Status: None (Preliminary result)   Collection Time: 07/01/22 12:57 PM   Specimen: BLOOD  Result Value Ref Range Status   Specimen Description   Final    BLOOD LEFT ANTECUBITAL Performed at [facility] [address] [place], [place] [address]    Special Requests   Final    BOTTLES DRAWN AEROBIC AND ANAEROBIC Blood Culture adequate volume Performed at [facility] [address] [place], [place] [address]    Culture   Final    NO GROWTH 3 DAYS Performed at [facility], [place] [address] [place], [place] [address]    Report Status PENDING  Incomplete  Resp Panel by RT-PCR (Flu A&B, Covid) Peripheral     Status: None   Collection Time: 07/01/22  1:39 PM   Specimen: Peripheral; Nasal Swab  Result Value Ref Range Status   SARS Coronavirus 2 by RT PCR NEGATIVE NEGATIVE Final    Comment: (NOTE) SARS-CoV-2 target nucleic acids are NOT DETECTED.  The SARS-CoV-2 RNA is generally detectable in upper respiratory specimens during the acute phase of infection. The lowest concentration of SARS-CoV-2 viral copies this assay can detect is 138 copies/mL. A negative result does not preclude SARS-Cov-2 infection and should not be used as the sole basis for treatment or other patient management decisions. A negative result may occur with  improper specimen collection/handling, submission of specimen other than nasopharyngeal swab, presence of viral mutation(s) within the areas targeted by this assay, and inadequate number of viral copies(<138 copies/mL). A  negative result must be combined with clinical observations, patient history, and epidemiological information. The expected result is Negative.  Fact Sheet for Patients:  EntrepreneurPulse.com.au  Fact Sheet for Healthcare Providers:  IncredibleEmployment.be  This test is no t yet approved or cleared by the [place] FDA and  has been authorized for detection and/or diagnosis of SARS-CoV-2 by FDA under an Emergency Use Authorization (EUA). This EUA will remain  in effect (meaning this test can be used) for the duration of the COVID-19 declaration under Section 564(b)(1) of the Act, 21 U.S.C.section 360bbb-3(b)(1), unless the authorization is terminated  or revoked sooner.       Influenza A by PCR NEGATIVE NEGATIVE Final   Influenza B by PCR NEGATIVE NEGATIVE Final    Comment: (NOTE) The Xpert Xpress SARS-CoV-2/FLU/RSV plus assay is intended as an aid in the diagnosis of influenza from Nasopharyngeal swab specimens and should not be used as a sole basis for treatment. Nasal washings and aspirates  are unacceptable for Xpert Xpress SARS-CoV-2/FLU/RSV testing.  Fact Sheet for Patients: EntrepreneurPulse.com.au  Fact Sheet for Healthcare Providers: IncredibleEmployment.be  This test is not yet approved or cleared by the [place] FDA and has been authorized for detection and/or diagnosis of SARS-CoV-2 by FDA under an Emergency Use Authorization (EUA). This EUA will remain in effect (meaning this test can be used) for the duration of the COVID-19 declaration under Section 564(b)(1) of the Act, 21 U.S.C. section 360bbb-3(b)(1), unless the authorization is terminated or revoked.  Performed at [facility], [place] [address], [place], [place] [address]   Expectorated Sputum Assessment w Gram Stain, Rflx to Resp Cult     Status: None   Collection Time: 07/03/22  6:36 AM   Specimen:  Expectorated Sputum  Result Value Ref Range Status   Specimen Description EXPECTORATED SPUTUM  Final   Special Requests NONE  Final   Sputum evaluation   Final    THIS SPECIMEN IS ACCEPTABLE FOR SPUTUM CULTURE Performed at [facility], [place] [address], [place], [place] [address]    Report Status 07/03/2022 FINAL  Final  Culture, Respiratory w Gram Stain     Status: None (Preliminary result)   Collection Time: 07/03/22  6:36 AM  Result Value Ref Range Status   Specimen Description   Final    EXPECTORATED SPUTUM Performed at [facility] [address], [place], [place] [address]    Special Requests   Final    NONE Reflexed from [id number] Performed at [facility], [place] [address], [place], [place] [address]    Gram Stain   Final    FEW WBC PRESENT,BOTH PMN AND MONONUCLEAR FEW BUDDING YEAST SEEN RARE GRAM POSITIVE RODS Performed at [facility], [place] [address] [place], [place] [address]    Culture PENDING  Incomplete   Report Status PENDING  Incomplete  Respiratory (~20 pathogens) panel by PCR     Status: Abnormal   Collection Time: 07/03/22  6:55 PM   Specimen: Nasopharyngeal Swab; Respiratory  Result Value Ref Range Status   Adenovirus NOT DETECTED NOT DETECTED Final   Coronavirus 229E NOT DETECTED NOT DETECTED Final    Comment: (NOTE) The Coronavirus on the Respiratory Panel, DOES NOT test for the novel  Coronavirus (2019 nCoV)    Coronavirus HKU1 NOT DETECTED NOT DETECTED Final   Coronavirus NL63 NOT DETECTED NOT DETECTED Final   Coronavirus OC43 NOT DETECTED NOT DETECTED Final   Metapneumovirus NOT DETECTED NOT DETECTED Final   Rhinovirus / Enterovirus DETECTED (A) NOT DETECTED Final   Influenza A NOT DETECTED NOT DETECTED Final   Influenza B NOT DETECTED NOT DETECTED Final   Parainfluenza Virus 1 NOT DETECTED NOT DETECTED Final   Parainfluenza Virus 2 NOT DETECTED NOT DETECTED Final   Parainfluenza Virus 3  NOT DETECTED NOT DETECTED Final   Parainfluenza Virus 4 NOT DETECTED NOT DETECTED Final   Respiratory Syncytial Virus NOT DETECTED NOT DETECTED Final   Bordetella pertussis NOT DETECTED NOT DETECTED Final   Bordetella Parapertussis NOT DETECTED NOT DETECTED Final   Chlamydophila pneumoniae NOT DETECTED NOT DETECTED Final   Mycoplasma pneumoniae NOT DETECTED NOT DETECTED Final    Comment: Performed at [facility], [place]. [address], [place], [place] [address]  MRSA Next Gen by PCR, Nasal     Status: None   Collection Time: 07/03/22  6:58 PM   Specimen: Nasal Mucosa; Nasal Swab  Result Value Ref Range Status  MRSA by PCR Next Gen NOT DETECTED NOT DETECTED Final    Comment: (NOTE) The GeneXpert MRSA Assay (FDA approved for NASAL specimens only), is one component of a comprehensive MRSA colonization surveillance program. It is not intended to diagnose MRSA infection nor to guide or monitor treatment for MRSA infections. Test performance is not FDA approved in patients less than 35 years old. Performed at [facility], [place] [address], [place], [place] [address]          Radiology Studies: DG CHEST PORT 1 VIEW  Result Date: 07/02/2022 CLINICAL DATA:  Shortness of breath EXAM: PORTABLE CHEST 1 VIEW COMPARISON:  07/01/2022 FINDINGS: Single frontal view of the chest demonstrates a stable cardiac silhouette. There is persistent retrocardiac consolidation, unchanged since prior exam. No effusion or pneumothorax. No acute bony abnormalities. IMPRESSION: 1. Stable left basilar consolidation, which may reflect infection or aspiration. Electronically Signed   By: Randa Ngo M.D.   On: 07/02/2022 18:20        Scheduled Meds:  amoxicillin-clavulanate  875 mg Oral Q12H   arformoterol  15 mcg Nebulization BID   budesonide (PULMICORT) nebulizer solution  0.5 mg Nebulization BID   bumetanide  1 mg Oral BID   buPROPion  150 mg Oral Daily   Chlorhexidine Gluconate  Cloth  6 each Topical Daily   enoxaparin (LOVENOX) injection  40 mg Subcutaneous Q24H   escitalopram  20 mg Oral QPM   famotidine  20 mg Oral QHS   fluconazole  150 mg Oral Once   guaiFENesin  15 mL Oral QID   insulin aspart  0-5 Units Subcutaneous QHS   insulin aspart  0-9 Units Subcutaneous TID WC   lamoTRIgine  150 mg Oral QHS   levothyroxine  100 mcg Oral q morning   montelukast  10 mg Oral QHS   pantoprazole  40 mg Oral Daily   predniSONE  40 mg Oral Q breakfast   Followed by   Derrill Memo ON 07/08/2022] predniSONE  30 mg Oral Q breakfast   Followed by   Derrill Memo ON 07/12/2022] predniSONE  20 mg Oral Q breakfast   Followed by   Derrill Memo ON 07/16/2022] predniSONE  10 mg Oral Q breakfast   Followed by   Derrill Memo ON 07/20/2022] predniSONE  5 mg Oral Q breakfast   revefenacin  175 mcg Nebulization Daily   sodium chloride HYPERTONIC  4 mL Nebulization BID   topiramate  150 mg Oral QHS   Continuous Infusions:     LOS: 3 days    Time spent: 35 minutes    Barb Merino, MD [facility] Pager [phone]

## 2022-07-04 NOTE — Progress Notes (Signed)
EEG complete - results pending 

## 2022-07-04 NOTE — Progress Notes (Signed)
RT responded to rapid response. Patient's oxygen was on 3L with a saturation 99%. It had appeared that the patient was having a seizure but no respiratory distress. RT will continue to monitor

## 2022-07-04 NOTE — Progress Notes (Addendum)
Patient was having an episode of rigors & pt was A/O, making eye contact but having severe anxiety/generalized pain. Charge RN Abby at bedside as well as this Therapist, sports. Patient had witnessed seizure like activity witnessed by Abby RN-body became rigid followed by an episode of unresponsiveness but never lost pulse or breathing. Patient then developed rapid eye movement. Rapid response was initiated at start & MD, RT & ICU RN at bedside. Mrs. Diemer was then moved to step-down for closer monitoring. Report given to [facility] in ICU.  Blood glucose WNL  07/04/22 0830  Vitals  Temp 98.8 F (37.1 C)  Temp Source Oral  BP (!) 145/85  BP Location Left Arm  BP Method Automatic  Patient Position (if appropriate) Sitting  Pulse Rate (!) 123  Pulse Rate Source Monitor  ECG Heart Rate (!) 128  Resp (!) 26  Level of Consciousness  Level of Consciousness Responds to Pain  MEWS COLOR  MEWS Score Color Red  Oxygen Therapy  SpO2 99 %  O2 Device Nasal Cannula  O2 Flow Rate (L/min) 4 L/min  MEWS Score  MEWS Temp 0  MEWS Systolic 0  MEWS Pulse 2  MEWS RR 2  MEWS LOC 2  MEWS Score 6

## 2022-07-05 ENCOUNTER — Ambulatory Visit: Payer: Medicare Other | Admitting: Psychiatry

## 2022-07-05 DIAGNOSIS — R251 Tremor, unspecified: Secondary | ICD-10-CM

## 2022-07-05 DIAGNOSIS — R6520 Severe sepsis without septic shock: Secondary | ICD-10-CM | POA: Diagnosis not present

## 2022-07-05 DIAGNOSIS — E2740 Unspecified adrenocortical insufficiency: Secondary | ICD-10-CM | POA: Diagnosis not present

## 2022-07-05 DIAGNOSIS — N179 Acute kidney failure, unspecified: Secondary | ICD-10-CM | POA: Diagnosis not present

## 2022-07-05 DIAGNOSIS — R569 Unspecified convulsions: Secondary | ICD-10-CM | POA: Diagnosis not present

## 2022-07-05 DIAGNOSIS — A419 Sepsis, unspecified organism: Secondary | ICD-10-CM | POA: Diagnosis not present

## 2022-07-05 DIAGNOSIS — J9611 Chronic respiratory failure with hypoxia: Secondary | ICD-10-CM | POA: Diagnosis not present

## 2022-07-05 LAB — GLUCOSE, CAPILLARY
Glucose-Capillary: 85 mg/dL (ref 70–99)
Glucose-Capillary: 106 mg/dL — ABNORMAL HIGH (ref 70–99)
Glucose-Capillary: 208 mg/dL — ABNORMAL HIGH (ref 70–99)
Glucose-Capillary: 61 mg/dL — ABNORMAL LOW (ref 70–99)
Glucose-Capillary: 117 mg/dL — ABNORMAL HIGH (ref 70–99)

## 2022-07-05 MED ORDER — MAGIC MOUTHWASH W/LIDOCAINE: 5.0000 mL | Freq: Three times a day (TID) | ORAL | Status: DC | PRN

## 2022-07-05 NOTE — Progress Notes (Signed)
NAME:  [name], MRN:  [id number], DOB:  [birth year], LOS: 34 ADMISSION DATE:  07/01/2022, CONSULTATION DATE:  07/03/22 REFERRING MD:  Barb Merino, MD CHIEF COMPLAINT:  Bronchospasms   History of Present Illness:  [name] is a 51 year old woman with history of hodgkin's lymphoma, breast cancer, cervical cancer, thyroid cancer, hypothyroidism, addison's disease, GERD, asthma, chronic hypoxemic respiratory failure, HFpEF, hypertension, anxiety/depression, and chronic pain syndrome who was admitted for sepsis due to pneumonia on 07/01/22. PCCM is consulted for ongoing bronchospasms.   She has dyspnea, wheezing, intermittent sensation of her throat narrowing. She had ENT evaluation last year and flexible laryngoscopy was unremarkable. She reports her symptoms are worse than her usual asthma exacerbation. She reports fevers earlier on during the onset of these symptoms.  Respiratory culture is showing few budding yeast and rare gram positive rods. Influenza and covid negative on admission. CXR on admission showed left lower lobe opacity. She was started on cefepime and azithromycin, changed cefepime to ceftriaxone and now taking augmentin.   She was last seen in pulmonary clinic 02/02/22 with transition to Advair 230-34mg 2 puffs twice daily. She reports being on nucala in the past with improvement of her breathing.  Pertinent  Medical History   Past Medical History:  Diagnosis Date   Addison's disease ([place]    Adrenal insufficiency ([facility]    Anemia    Anxiety    Aortic stenosis    Aortic stenosis    Appendicitis 12/19/09   Appendicitis    Breast cancer (HCC)    STATUS POST BILATERAL MASTECTOMY. STATUS POST RECONSTRUCTION. SHE HAD SILICONE BREAST IMPLANTS AND THE LEFT IMPLANT IS LEAKING SLIGHTLY   Cellulitis of right middle finger 11/07/2018   Cervical cancer ([facility] 12/23/2018   Chest pain    CHF with right heart failure ([facility] 04/17/2017   Chronic respiratory failure with hypoxia (HCC)  12/23/2018   Cough variant asthma 04/13/2019   Depression    GERD (gastroesophageal reflux disease)    takes Dexilant and carafate and gi coctail    Headache    migraines on a daily and monthly regimen    Heart murmur    History of kidney stones    Hodgkin lymphoma ([facility]    STATUS POST MANTLE RADIATION   Hodgkin's lymphoma ([facility]    1987   Hypertension    Hypoxia    Necrotizing fasciitis ([facility] 12/23/2018   Non-ischemic cardiomyopathy ([place]    Osteoporosis    Palpitations    Pituitary adenoma ([facility] 12/23/2018   Pneumonia    PONV (postoperative nausea and vomiting)    Pre-diabetes    per pt; no meds   Pulmonary hypertension ([place] 12/23/2018   Raynaud phenomenon    Right heart failure ([facility] 04/17/2017   Seizures ([facility]    last febrile seizure was approx 3 weeks ago per report on 12/01/2020   Sleep apnea    upcoming sleep study per pt    Supplemental oxygen dependent    3 liters   SVT (supraventricular tachycardia)    Tachycardia    Thyroid cancer ([place]    STATUS POST SURGICAL REMOVAL-CURRENT ON THYROID REPLACEMENT    Significant Hospital Events: Including procedures, antibiotic start and stop dates in addition to other pertinent events   10/1 admitted 10/3 PCCM consulted for recurrent bronchospasms 10/4 transferred to step down unit for rapid response  Interim History / Subjective:   No acute events overnight, nursing reports she slept good. Breathing is a bit better She  reports the hypertonic saline nebs are helping clear mucous  Objective   Blood pressure (!) 105/55, pulse 96, temperature 98 F (36.7 C), temperature source Oral, resp. rate (!) 22, height _0  (1.651 m), weight 88.5 kg, SpO2 99 %.        Intake/Output Summary (Last 24 hours) at 07/05/2022 0828 Last data filed at 07/05/2022 6381 Gross per 24 hour  Intake --  Output 700 ml  Net -700 ml   Filed Weights   07/04/22 0541 07/04/22 0902 07/05/22 0500  Weight: 95.7 kg 81.3 kg 88.5 kg     Examination: General: middle aged woman, mild distress, appears anxious HENT: [place]/AT, mild upper airway wheeze, moist mucous membranes Lungs: no wheezing, no rales Cardiovascular: rrr, no murmurs Abdomen: soft, non-tender, non-distended, BS+ Extremities: warm, trace edema Neuro: alert, oriented, moving all extremities GU: n/a  Resolved Hospital Problem list     Assessment & Plan:  Viral Pneumonia/Bronchitis, rhinovirus+ Severe Persistent Asthma with Exacerbation Chronic Hypoxemic Respiratory Failure Adrenal Insufficiency Anxiety/Depression GERD  Discussion: Patient has rhinovirus pneumonia/bronchitis with severe asthma exacerbation. She has upper airway wheeze concerning for questionable upper airway inflammation vs vocal cord dysfunction. She has significant anxiety component to her presentation as well. Previous bronchoscopy not notable for excessive dynamic airway compression or tracheobronchomalacia.  Plan: - Continue oral prednisone taper, can add on her home hydrocort towards end of taper - Continue budesonide + brovana twice daily and yupelri daily - Continue hypertonic saline nebs twice daily - PRN duonebs q4hrs - PRN lidocaine nebulizer treatment for cough - She is on pantoprazole 40m daily, added 237mfamotidine at bedtime 10/3 - Recommend eating at least 2 hours before bed, sleep with head of bed 30 degrees or higher. Recommend reducing her soda intake. - Augmentin per ID  PCCM will continue to follow   Best Practice (right click and "Reselect all SmartList Selections" daily)   Per primary team  Labs   CBC: Recent Labs  Lab 07/01/22 1336 07/03/22 0529  WBC 16.5* 17.8*  NEUTROABS 14.1* 15.6*  HGB 9.8* 8.7*  HCT 32.2* 29.4*  MCV 80.7 82.8  PLT 328 32771  Basic Metabolic Panel: Recent Labs  Lab 07/01/22 1336 07/03/22 0529  NA 139 139  K 3.0* 4.2  CL 100 106  CO2 26 23  GLUCOSE 138* 160*  BUN 25* 30*  CREATININE 1.55* 1.10*  CALCIUM 8.9  9.3  MG  --  2.3  PHOS  --  3.4   GFR: Estimated Creatinine Clearance: 66.5 mL/min (A) (by C-G formula based on SCr of 1.1 mg/dL (H)). Recent Labs  Lab 07/01/22 1336 07/01/22 1512 07/03/22 0529  WBC 16.5*  --  17.8*  LATICACIDVEN  --  3.0*  --     Liver Function Tests: Recent Labs  Lab 07/01/22 1336  AST 32  ALT 29  ALKPHOS 70  BILITOT 0.7  PROT 7.6  ALBUMIN 4.0   Recent Labs  Lab 07/01/22 1336  LIPASE 44   No results for input(s): "AMMONIA" in the last 168 hours.  ABG    Component Value Date/Time   PHART 7.36 07/02/2022 1740   PCO2ART 40 07/02/2022 1740   PO2ART 104 07/02/2022 1740   HCO3 22.6 07/02/2022 1740   TCO2 21 (L) 04/02/2018 1343   TCO2 22 04/02/2018 1343   ACIDBASEDEF 2.9 (H) 07/02/2022 1740   O2SAT 100 07/02/2022 1740     Coagulation Profile: No results for input(s): "INR", "PROTIME" in the last 168 hours.  Cardiac  Enzymes: No results for input(s): "CKTOTAL", "CKMB", "CKMBINDEX", "TROPONINI" in the last 168 hours.  HbA1C: Hgb A1c MFr Bld  Date/Time Value Ref Range Status  02/09/2022 12:02 PM 6.3 (H) 4.8 - 5.6 % Final    Comment:    (NOTE) Pre diabetes:          5.7%-6.4%  Diabetes:              >6.4%  Glycemic control for   <7.0% adults with diabetes   12/05/2020 02:55 PM 6.8 (H) 4.8 - 5.6 % Final    Comment:    (NOTE) Pre diabetes:          5.7%-6.4%  Diabetes:              >6.4%  Glycemic control for   <7.0% adults with diabetes     CBG: Recent Labs  Lab 07/04/22 0830 07/04/22 1155 07/04/22 1557 07/04/22 2040 07/05/22 0735  GLUCAP 96 108* 188* 165* 85     Critical care time: n/a    Freda Jackson, MD [facility] Office: [phone]   See Amion for personal pager PCCM on call pager [phone] until 7pm. Please call Elink 7p-7a. [phone]

## 2022-07-05 NOTE — Progress Notes (Signed)
PROGRESS NOTE    [name]  EMV:[id number] DOB: June 18, 1971 DOA: 07/01/2022 PCP: Su Grand, MD    Brief Narrative:  51 year old with history of extensive multiple cancers, chronic hypoxemia on 4 L oxygen, Addison's disease, GERD, migraine, adrenal insufficiency, hypothyroidism anxiety depression chronic pain syndrome and recurrent admissions, recent bacteremia presented with feeling weak, fatigue and fever at home, shortness of breath not improved with inhalers at home.  In the emergency room temperature 102.3, WBC count 16.5, blood pressures 90/58, lactic acid 3.  Chest x-ray with left lower lobe infiltrate.  Admitted and treated as sepsis due to pneumonia.  Rapid responses 10/2 evening and night, 10/3 for intractable cough and bronchospasm.  Anxiety attacks after starting cough. Rapid response 10/4 morning for rigors that was alerted as seizures.  See below. Viral panel positive for rhinovirus.   Assessment & Plan:   Sepsis present on admission secondary to left lower lobe pneumonia, immunocompromised patient.  Hypoxemia, lactic acidosis and leukocytosis.  Respiratory virus panel positive for rhinovirus. Severe asthma exacerbation secondary to above. COVID-19 influenza negative.  Blood cultures and sputum cultures negative.  Treated with Rocephin and azithromycin.  Now on Augmentin.  We will continue total 10 days of therapy as recommended. Chest physiotherapy, incentive spirometry, deep breathing exercises, sputum induction, mucolytic's and bronchodilators. Patient did well with hypertonic saline nebulizer.  Pulmonary recommended Pulmicort nebulizer, Brovana and Yupelri.  Will prescribe on discharge. Currently on prednisone taper, she will continue prednisone taper before restarting her home dose of steroids.  Acute renal failure: Treated with IV fluids.  Hemodynamics improved.  Levels improved.  Started back on Bumex.  Chronic hypoxemic respiratory failure: Continuing  saturations on 3 to 4 L oxygen.  At baseline.  Saturation will need to be 90 to 92%.  Chronic diastolic heart failure, pulmonary hypertension:beta-blockers diuretics.  Resumed beta-blockers and Bumex.   Adrenal insufficiency: Currently on prednisone taper.  She will resume Solu-Cortef from her home doses 20/10 mg after completing prednisone.  Anxiety, depression, insomnia.  Multiple pain issues and nausea. Symptomatic treatment. Resume Klonopin, Wellbutrin, Marinol, Lexapro, Lamictal and Ambien. Decrease and taper off injectable narcotics and benzos.  Hyperglycemia associated with steroid use: Cover with sliding scale insulin.  Patient on Januvia at home.  Hypokalemia: Adequately replaced.  Shaking/tremors: No evidence of seizure disorder.  EEG was normal.  CT head was normal.  Mobilize.  Out of bed.  Monitor today, anticipate home tomorrow. Screening labs tomorrow morning. Stay at stepdown unit due to significant and frequent intervention needed.  DVT prophylaxis: enoxaparin (LOVENOX) injection 40 mg Start: 07/01/22 2200   Code Status: DNR with full scope of treatment Family Communication: None today. Disposition Plan: Status is: Inpatient Remains inpatient appropriate because: Significant respiratory symptoms.     Consultants:  Infectious disease Pulmonary  Procedures:  None  Antimicrobials:  Cefepime and azithromycin 10/1--- 10/3 Augmentin 10/3---   Subjective:  Patient seen and examined.  No overnight events.  Much more communicating and alert today.  Still has some cough but bronchospasm is better.  Needing to use less and less pain medication and anxiety medications.  She is trying to eat.  She is ready to walk.  Afebrile.  On 4 L oxygen.  No more rigors or chills.  Objective: Vitals:   07/05/22 0400 07/05/22 0500 07/05/22 0600 07/05/22 0800  BP: (!) 118/59  (!) 105/55   Pulse: 76  96   Resp: 13  (!) 22   Temp:    98 F (36.7  C)  TempSrc:    Oral  SpO2:  100%  99%   Weight:  88.5 kg    Height:        Intake/Output Summary (Last 24 hours) at 07/05/2022 1058 Last data filed at 07/05/2022 0639 Gross per 24 hour  Intake --  Output 700 ml  Net -700 ml   Filed Weights   07/04/22 0541 07/04/22 0902 07/05/22 0500  Weight: 95.7 kg 81.3 kg 88.5 kg    Examination:  General: Looks fairly comfortable.  Appropriately anxious and tremulous. Not in any distress.  Able to talk in full sentences. Cardiovascular: S1 and S2 normal.  Regular rate rhythm. Respiratory: Bilateral clear.  She does have dry cough induced with deep breathing. Gastrointestinal: Soft.  Nontender.  Bowel sound present. Ext: Patient does have chronic lymphedematous changes on her dorsum of hands and legs, no pitting edema. Neuro: Alert and x4.  No focal neurological deficits. Musculoskeletal: No deformities.      Data Reviewed: I have personally reviewed following labs and imaging studies  CBC: Recent Labs  Lab 07/01/22 1336 07/03/22 0529  WBC 16.5* 17.8*  NEUTROABS 14.1* 15.6*  HGB 9.8* 8.7*  HCT 32.2* 29.4*  MCV 80.7 82.8  PLT 328 893   Basic Metabolic Panel: Recent Labs  Lab 07/01/22 1336 07/03/22 0529  NA 139 139  K 3.0* 4.2  CL 100 106  CO2 26 23  GLUCOSE 138* 160*  BUN 25* 30*  CREATININE 1.55* 1.10*  CALCIUM 8.9 9.3  MG  --  2.3  PHOS  --  3.4   GFR: Estimated Creatinine Clearance: 66.5 mL/min (A) (by C-G formula based on SCr of 1.1 mg/dL (H)). Liver Function Tests: Recent Labs  Lab 07/01/22 1336  AST 32  ALT 29  ALKPHOS 70  BILITOT 0.7  PROT 7.6  ALBUMIN 4.0   Recent Labs  Lab 07/01/22 1336  LIPASE 44   No results for input(s): "AMMONIA" in the last 168 hours. Coagulation Profile: No results for input(s): "INR", "PROTIME" in the last 168 hours. Cardiac Enzymes: No results for input(s): "CKTOTAL", "CKMB", "CKMBINDEX", "TROPONINI" in the last 168 hours. BNP (last 3 results) No results for input(s): "PROBNP" in the last 8760  hours. HbA1C: No results for input(s): "HGBA1C" in the last 72 hours. CBG: Recent Labs  Lab 07/04/22 0830 07/04/22 1155 07/04/22 1557 07/04/22 2040 07/05/22 0735  GLUCAP 96 108* 188* 165* 85   Lipid Profile: No results for input(s): "CHOL", "HDL", "LDLCALC", "TRIG", "CHOLHDL", "LDLDIRECT" in the last 72 hours. Thyroid Function Tests: No results for input(s): "TSH", "T4TOTAL", "FREET4", "T3FREE", "THYROIDAB" in the last 72 hours. Anemia Panel: No results for input(s): "VITAMINB12", "FOLATE", "FERRITIN", "TIBC", "IRON", "RETICCTPCT" in the last 72 hours. Sepsis Labs: Recent Labs  Lab 07/01/22 1512  LATICACIDVEN 3.0*    Recent Results (from the past 240 hour(s))  Culture, blood (routine x 2)     Status: None (Preliminary result)   Collection Time: 07/01/22 12:57 PM   Specimen: BLOOD  Result Value Ref Range Status   Specimen Description   Final    BLOOD RIGHT ANTECUBITAL Performed at [facility] [address] [place], [place] [address]    Special Requests   Final    BOTTLES DRAWN AEROBIC ONLY Blood Culture adequate volume Performed at [facility] [address] [place] [address], [place], [place] [address]    Culture   Final    NO GROWTH 4 DAYS Performed at [facility], [place]  [address], [place], [place] [address]    Report Status PENDING  Incomplete  Culture, blood (routine x 2)     Status: None (Preliminary result)   Collection Time: 07/01/22 12:57 PM   Specimen: BLOOD  Result Value Ref Range Status   Specimen Description   Final    BLOOD LEFT ANTECUBITAL Performed at [facility] [address] [place], [place] [address]    Special Requests   Final    BOTTLES DRAWN AEROBIC AND ANAEROBIC Blood Culture adequate volume Performed at [facility] [address] [place], [place] [address]    Culture   Final    NO GROWTH 4 DAYS Performed at [facility], [place] [address] [place],  [place] [address]    Report Status PENDING  Incomplete  Resp Panel by RT-PCR (Flu A&B, Covid) Peripheral     Status: None   Collection Time: 07/01/22  1:39 PM   Specimen: Peripheral; Nasal Swab  Result Value Ref Range Status   SARS Coronavirus 2 by RT PCR NEGATIVE NEGATIVE Final    Comment: (NOTE) SARS-CoV-2 target nucleic acids are NOT DETECTED.  The SARS-CoV-2 RNA is generally detectable in upper respiratory specimens during the acute phase of infection. The lowest concentration of SARS-CoV-2 viral copies this assay can detect is 138 copies/mL. A negative result does not preclude SARS-Cov-2 infection and should not be used as the sole basis for treatment or other patient management decisions. A negative result may occur with  improper specimen collection/handling, submission of specimen other than nasopharyngeal swab, presence of viral mutation(s) within the areas targeted by this assay, and inadequate number of viral copies(<138 copies/mL). A negative result must be combined with clinical observations, patient history, and epidemiological information. The expected result is Negative.  Fact Sheet for Patients:  EntrepreneurPulse.com.au  Fact Sheet for Healthcare Providers:  IncredibleEmployment.be  This test is no t yet approved or cleared by the [place] FDA and  has been authorized for detection and/or diagnosis of SARS-CoV-2 by FDA under an Emergency Use Authorization (EUA). This EUA will remain  in effect (meaning this test can be used) for the duration of the COVID-19 declaration under Section 564(b)(1) of the Act, 21 U.S.C.section 360bbb-3(b)(1), unless the authorization is terminated  or revoked sooner.       Influenza A by PCR NEGATIVE NEGATIVE Final   Influenza B by PCR NEGATIVE NEGATIVE Final    Comment: (NOTE) The Xpert Xpress SARS-CoV-2/FLU/RSV plus assay is intended as an aid in the diagnosis of influenza from Nasopharyngeal  swab specimens and should not be used as a sole basis for treatment. Nasal washings and aspirates are unacceptable for Xpert Xpress SARS-CoV-2/FLU/RSV testing.  Fact Sheet for Patients: EntrepreneurPulse.com.au  Fact Sheet for Healthcare Providers: IncredibleEmployment.be  This test is not yet approved or cleared by the [place] FDA and has been authorized for detection and/or diagnosis of SARS-CoV-2 by FDA under an Emergency Use Authorization (EUA). This EUA will remain in effect (meaning this test can be used) for the duration of the COVID-19 declaration under Section 564(b)(1) of the Act, 21 U.S.C. section 360bbb-3(b)(1), unless the authorization is terminated or revoked.  Performed at [facility], [place] [address], [place], [place] [address]   Expectorated Sputum Assessment w Gram Stain, Rflx to Resp Cult     Status: None   Collection Time: 07/03/22  6:36 AM   Specimen: Expectorated Sputum  Result Value Ref Range Status   Specimen Description EXPECTORATED  SPUTUM  Final   Special Requests NONE  Final   Sputum evaluation   Final    THIS SPECIMEN IS ACCEPTABLE FOR SPUTUM CULTURE Performed at [facility], [place] [address] [place], [place] [address]    Report Status 07/03/2022 FINAL  Final  Culture, Respiratory w Gram Stain     Status: None (Preliminary result)   Collection Time: 07/03/22  6:36 AM  Result Value Ref Range Status   Specimen Description   Final    EXPECTORATED SPUTUM Performed at [facility] [address], [place], [place] [address]    Special Requests   Final    NONE Reflexed from [id number] Performed at [facility], [place] [address] [place], [place] [address]    Gram Stain   Final    FEW WBC PRESENT,BOTH PMN AND MONONUCLEAR FEW BUDDING YEAST SEEN RARE GRAM POSITIVE RODS    Culture   Final    CULTURE REINCUBATED FOR BETTER GROWTH Performed  at [facility], [place] [address] [place], [place] [address]    Report Status PENDING  Incomplete  Respiratory (~20 pathogens) panel by PCR     Status: Abnormal   Collection Time: 07/03/22  6:55 PM   Specimen: Nasopharyngeal Swab; Respiratory  Result Value Ref Range Status   Adenovirus NOT DETECTED NOT DETECTED Final   Coronavirus 229E NOT DETECTED NOT DETECTED Final    Comment: (NOTE) The Coronavirus on the Respiratory Panel, DOES NOT test for the novel  Coronavirus (2019 nCoV)    Coronavirus HKU1 NOT DETECTED NOT DETECTED Final   Coronavirus NL63 NOT DETECTED NOT DETECTED Final   Coronavirus OC43 NOT DETECTED NOT DETECTED Final   Metapneumovirus NOT DETECTED NOT DETECTED Final   Rhinovirus / Enterovirus DETECTED (A) NOT DETECTED Final   Influenza A NOT DETECTED NOT DETECTED Final   Influenza B NOT DETECTED NOT DETECTED Final   Parainfluenza Virus 1 NOT DETECTED NOT DETECTED Final   Parainfluenza Virus 2 NOT DETECTED NOT DETECTED Final   Parainfluenza Virus 3 NOT DETECTED NOT DETECTED Final   Parainfluenza Virus 4 NOT DETECTED NOT DETECTED Final   Respiratory Syncytial Virus NOT DETECTED NOT DETECTED Final   Bordetella pertussis NOT DETECTED NOT DETECTED Final   Bordetella Parapertussis NOT DETECTED NOT DETECTED Final   Chlamydophila pneumoniae NOT DETECTED NOT DETECTED Final   Mycoplasma pneumoniae NOT DETECTED NOT DETECTED Final    Comment: Performed at [facility], [place] [address], [place], [place] [address]  MRSA Next Gen by PCR, Nasal     Status: None   Collection Time: 07/03/22  6:58 PM   Specimen: Nasal Mucosa; Nasal Swab  Result Value Ref Range Status   MRSA by PCR Next Gen NOT DETECTED NOT DETECTED Final    Comment: (NOTE) The GeneXpert MRSA Assay (FDA approved for NASAL specimens only), is one component of a comprehensive MRSA colonization surveillance program. It is not intended to diagnose MRSA infection nor to guide or monitor treatment for MRSA  infections. Test performance is not FDA approved in patients less than 21 years old. Performed at [facility], [place] [address], [place],  [address]          Radiology Studies: EEG adult  Result Date: 22-Jul-2022 Lora Havens, MD     July 22, 2022  8:49 AM Patient Name: [name] MRN: [id number] Epilepsy Attending: Lora Havens Referring Physician/Provider: Barb Merino, MD Date: 07/04/2022 Duration: 24.40 mins Patient history: 51yo F  noted to have shaking and tremors with episodes of unresponsiveness today morning, on examination at bedside breathing normal, 100% on 3 L oxygen, patient is with flickering eyes, she will withdraw to pain, tone normal.  No evidence of tonic-clonic seizure. EEG to evaluate for seizure. Level of alertness: Awake AEDs during EEG study:  TPM, LTG Technical aspects: This EEG study was done with scalp electrodes positioned according to the 10-20 International system of electrode placement. Electrical activity was reviewed with band pass filter of 1-_0 , sensitivity of 7 uV/mm, display speed of 84m/sec with a _1  notched filter applied as appropriate. EEG data were recorded continuously and digitally stored.  Video monitoring was available and reviewed as appropriate. Description: The posterior dominant rhythm consists of 9-10 Hz activity of moderate voltage (25-35 uV) seen predominantly in posterior head regions, symmetric and reactive to eye opening and eye closing. Physiologic photic driving was seen during photic stimulation.  Hyperventilation was not performed.   Of note, parts of study were technically difficult due to significant movement artifact. IMPRESSION: This study is within normal limits. No seizures or epileptiform discharges were seen throughout the recording. A normal interictal EEG does not exclude the diagnosis of epilepsy. Priyanka OBarbra Sarks  CT HEAD WO CONTRAST (5MM)  Result Date: 07/04/2022 CLINICAL DATA:  Mental  status changes EXAM: CT HEAD WITHOUT CONTRAST TECHNIQUE: Contiguous axial images were obtained from the base of the skull through the vertex without intravenous contrast. RADIATION DOSE REDUCTION: This exam was performed according to the departmental dose-optimization program which includes automated exposure control, adjustment of the mA and/or kV according to patient size and/or use of iterative reconstruction technique. COMPARISON:  02/10/2020 FINDINGS: Brain: No evidence of acute infarction, hemorrhage, hydrocephalus, extra-axial collection or mass lesion/mass effect. Vascular: No hyperdense vessel or unexpected calcification. Skull: No osseous abnormality. Sinuses/Orbits: Visualized paranasal sinuses are clear. Visualized mastoid sinuses are clear. Visualized orbits demonstrate no focal abnormality. Other: None IMPRESSION: No acute intracranial findings. Electronically Signed   By: HKathreen DevoidM.D.   On: 07/04/2022 15:23        Scheduled Meds:  amoxicillin-clavulanate  875 mg Oral Q12H   arformoterol  15 mcg Nebulization BID   budesonide (PULMICORT) nebulizer solution  0.5 mg Nebulization BID   bumetanide  1 mg Oral BID   buPROPion  150 mg Oral Daily   Chlorhexidine Gluconate Cloth  6 each Topical Daily   enoxaparin (LOVENOX) injection  40 mg Subcutaneous Q24H   escitalopram  20 mg Oral QPM   famotidine  20 mg Oral QHS   fluconazole  150 mg Oral Once   guaiFENesin  15 mL Oral QID   insulin aspart  0-5 Units Subcutaneous QHS   insulin aspart  0-9 Units Subcutaneous TID WC   lamoTRIgine  150 mg Oral QHS   levothyroxine  100 mcg Oral q morning   montelukast  10 mg Oral QHS   pantoprazole  40 mg Oral Daily   predniSONE  40 mg Oral Q breakfast   Followed by   [Derrill MemoON 07/08/2022] predniSONE  30 mg Oral Q breakfast   Followed by   [Derrill MemoON 07/12/2022] predniSONE  20 mg Oral Q breakfast   Followed by   [Derrill MemoON 07/16/2022] predniSONE  10 mg Oral Q breakfast   Followed by   [Derrill MemoON  07/20/2022] predniSONE  5 mg Oral Q breakfast   revefenacin  175 mcg Nebulization Daily   sodium chloride HYPERTONIC  4 mL Nebulization BID  topiramate  150 mg Oral QHS   Continuous Infusions:     LOS: 4 days    Time spent: 35 minutes    Barb Merino, MD [facility] Pager [phone]

## 2022-07-05 NOTE — Procedures (Signed)
Patient Name: [name]  MRN: [id number]  Epilepsy Attending: Lora Havens  Referring Physician/Provider: Barb Merino, MD  Date: 07/04/2022 Duration: 24.40 mins  Patient history: 50yo F noted to have shaking and tremors with episodes of unresponsiveness today morning, on examination at bedside breathing normal, 100% on 3 L oxygen, patient is with flickering eyes, she will withdraw to pain, tone normal.  No evidence of tonic-clonic seizure. EEG to evaluate for seizure.   Level of alertness: Awake  AEDs during EEG study:  TPM, LTG  Technical aspects: This EEG study was done with scalp electrodes positioned according to the 10-20 International system of electrode placement. Electrical activity was reviewed with band pass filter of 1-_0 , sensitivity of 7 uV/mm, display speed of 38m/sec with a _1  notched filter applied as appropriate. EEG data were recorded continuously and digitally stored.  Video monitoring was available and reviewed as appropriate.  Description: The posterior dominant rhythm consists of 9-10 Hz activity of moderate voltage (25-35 uV) seen predominantly in posterior head regions, symmetric and reactive to eye opening and eye closing. Physiologic photic driving was seen during photic stimulation.  Hyperventilation was not performed.     Of note, parts of study were technically difficult due to significant movement artifact.  IMPRESSION: This study is within normal limits. No seizures or epileptiform discharges were seen throughout the recording.  A normal interictal EEG does not exclude the diagnosis of epilepsy.   Samamtha Tiegs OBarbra Sarks

## 2022-07-06 DIAGNOSIS — J4551 Severe persistent asthma with (acute) exacerbation: Secondary | ICD-10-CM | POA: Diagnosis not present

## 2022-07-06 LAB — BASIC METABOLIC PANEL
Anion gap: 8 (ref 5–15)
BUN: 25 mg/dL — ABNORMAL HIGH (ref 6–20)
CO2: 28 mmol/L (ref 22–32)
Calcium: 8.4 mg/dL — ABNORMAL LOW (ref 8.9–10.3)
Chloride: 103 mmol/L (ref 98–111)
Creatinine, Ser: 1.13 mg/dL — ABNORMAL HIGH (ref 0.44–1.00)
GFR, Estimated: 59 mL/min — ABNORMAL LOW (ref >60)
Glucose, Bld: 89 mg/dL (ref 70–99)
Potassium: 2.8 mmol/L — ABNORMAL LOW (ref 3.5–5.1)
Sodium: 139 mmol/L (ref 135–145)

## 2022-07-06 LAB — CBC WITH DIFFERENTIAL/PLATELET
Abs Immature Granulocytes: 0.09 10*3/uL — ABNORMAL HIGH (ref 0.00–0.07)
Basophils Absolute: 0 10*3/uL (ref 0.0–0.1)
Basophils Relative: 0 %
Eosinophils Absolute: 0.2 10*3/uL (ref 0.0–0.5)
Eosinophils Relative: 2 %
HCT: 26.6 % — ABNORMAL LOW (ref 36.0–46.0)
Hemoglobin: 8 g/dL — ABNORMAL LOW (ref 12.0–15.0)
Immature Granulocytes: 1 %
Lymphocytes Relative: 19 %
Lymphs Abs: 2 10*3/uL (ref 0.7–4.0)
MCH: 25 pg — ABNORMAL LOW (ref 26.0–34.0)
MCHC: 30.1 g/dL (ref 30.0–36.0)
MCV: 83.1 fL (ref 80.0–100.0)
Monocytes Absolute: 0.8 10*3/uL (ref 0.1–1.0)
Monocytes Relative: 8 %
Neutro Abs: 7.5 10*3/uL (ref 1.7–7.7)
Neutrophils Relative %: 70 %
Platelets: 310 10*3/uL (ref 150–400)
RBC: 3.2 MIL/uL — ABNORMAL LOW (ref 3.87–5.11)
RDW: 18.3 % — ABNORMAL HIGH (ref 11.5–15.5)
WBC: 10.7 10*3/uL — ABNORMAL HIGH (ref 4.0–10.5)
nRBC: 0.3 % — ABNORMAL HIGH (ref 0.0–0.2)

## 2022-07-06 LAB — CULTURE, BLOOD (ROUTINE X 2)
Culture: NO GROWTH
Culture: NO GROWTH
Special Requests: ADEQUATE
Special Requests: ADEQUATE

## 2022-07-06 LAB — CULTURE, RESPIRATORY W GRAM STAIN

## 2022-07-06 LAB — GLUCOSE, CAPILLARY
Glucose-Capillary: 112 mg/dL — ABNORMAL HIGH (ref 70–99)
Glucose-Capillary: 82 mg/dL (ref 70–99)

## 2022-07-06 LAB — MAGNESIUM: Magnesium: 2.7 mg/dL — ABNORMAL HIGH (ref 1.7–2.4)

## 2022-07-06 MED ORDER — DIPHENHYDRAMINE HCL 12.5 MG/5ML PO ELIX
25.0000 mg | ORAL_SOLUTION | Freq: Four times a day (QID) | ORAL | Status: DC | PRN
  Administered 2022-07-06: 25 mg via ORAL
  Filled 2022-07-06: qty 10

## 2022-07-06 MED ORDER — LINAGLIPTIN 5 MG PO TABS
5.0000 mg | ORAL_TABLET | Freq: Every day | ORAL | Status: DC
  Administered 2022-07-06 – 2022-07-08 (×3): 5 mg via ORAL
  Filled 2022-07-06 (×3): qty 1

## 2022-07-06 MED ORDER — MAGNESIUM SULFATE 2 GM/50ML IV SOLN
2.0000 g | Freq: Once | INTRAVENOUS | Status: AC
  Administered 2022-07-06: 2 g via INTRAVENOUS
  Filled 2022-07-06: qty 50

## 2022-07-06 MED ORDER — ALBUTEROL SULFATE (2.5 MG/3ML) 0.083% IN NEBU: 2.5000 mg | INHALATION_SOLUTION | RESPIRATORY_TRACT | Status: DC | PRN

## 2022-07-06 MED ORDER — HYDROMORPHONE HCL 1 MG/ML PO LIQD: 1.0000 mg | Freq: Four times a day (QID) | ORAL | Status: DC | PRN

## 2022-07-06 MED ORDER — SACCHAROMYCES BOULARDII 250 MG PO CAPS
250.0000 mg | ORAL_CAPSULE | Freq: Two times a day (BID) | ORAL | Status: DC
  Administered 2022-07-06 – 2022-07-08 (×6): 250 mg via ORAL
  Filled 2022-07-06 (×6): qty 1

## 2022-07-06 MED ORDER — POTASSIUM CHLORIDE 20 MEQ PO PACK
40.0000 meq | PACK | Freq: Two times a day (BID) | ORAL | Status: AC
  Administered 2022-07-06 – 2022-07-07 (×4): 40 meq via ORAL
  Filled 2022-07-06 (×4): qty 2

## 2022-07-06 MED ORDER — LIDOCAINE HCL (PF) 2 % IJ SOLN
2.0000 mL | Freq: Once | INTRAMUSCULAR | Status: AC
  Administered 2022-07-06: 2 mL
  Filled 2022-07-06: qty 2

## 2022-07-06 MED ORDER — HYDROMORPHONE HCL 2 MG PO TABS
1.0000 mg | ORAL_TABLET | Freq: Four times a day (QID) | ORAL | Status: DC | PRN
  Administered 2022-07-06 – 2022-07-08 (×2): 1 mg via ORAL
  Filled 2022-07-06 (×2): qty 1

## 2022-07-06 NOTE — TOC Progression Note (Signed)
Transition of Care [facility]) - Progression Note    Patient Details  Name: ETOY MCDONNELL MRN: [id number] Date of Birth: 25-Aug-1971  Transition of Care [facility]) CM/SW [place], RN Phone Number: 07/06/2022, 10:37 AM  Clinical Narrative:   Patient on 3-4L oxygen prior to admission, TOC will contiue to follow for discharge/TOC needs.          Expected Discharge Plan and Services                                                 Social Determinants of Health (SDOH) Interventions    Readmission Risk Interventions    07/02/2022    1:15 PM 07/02/2022   11:32 AM 04/27/2020    1:19 PM  Readmission Risk Prevention Plan  Transportation Screening Complete Complete Complete  PCP or Specialist Appt within 5-7 Days Complete Complete   PCP or Specialist Appt within 3-5 Days   Complete  Home Care Screening Complete Complete   Medication Review (RN CM) Complete Complete   HRI or Home Care Consult   Complete  Social Work Consult for [place] Planning/Counseling   Complete  Palliative Care Screening   Not Applicable  Medication Review Press photographer)   Complete

## 2022-07-06 NOTE — Progress Notes (Signed)
NAME:  [name], MRN:  [id number], DOB:  10/30/1970, LOS: 72 ADMISSION DATE:  07/01/2022, CONSULTATION DATE:  07/03/22 REFERRING MD:  Barb Merino, MD CHIEF COMPLAINT:  Bronchospasms   History of Present Illness:  [name] is a 51 year old woman with history of hodgkin's lymphoma, breast cancer, cervical cancer, thyroid cancer, hypothyroidism, addison's disease, GERD, asthma, chronic hypoxemic respiratory failure, HFpEF, hypertension, anxiety/depression, and chronic pain syndrome who was admitted for sepsis due to pneumonia on 07/01/22. PCCM is consulted for ongoing bronchospasms.   She has dyspnea, wheezing, intermittent sensation of her throat narrowing. She had ENT evaluation last year and flexible laryngoscopy was unremarkable. She reports her symptoms are worse than her usual asthma exacerbation. She reports fevers earlier on during the onset of these symptoms.  Respiratory culture is showing few budding yeast and rare gram positive rods. Influenza and covid negative on admission. CXR on admission showed left lower lobe opacity. She was started on cefepime and azithromycin, changed cefepime to ceftriaxone and now taking augmentin.   She was last seen in pulmonary clinic 02/02/22 with transition to Advair 230-51mg 2 puffs twice daily. She reports being on nucala in the past with improvement of her breathing.  Pertinent  Medical History   Past Medical History:  Diagnosis Date   [place]ison's disease ([place]    Adrenal insufficiency ([place]    Anemia    Anxiety    Aortic stenosis    Aortic stenosis    Appendicitis 12/19/09   Appendicitis    Breast cancer (HCC)    STATUS POST BILATERAL MASTECTOMY. STATUS POST RECONSTRUCTION. SHE HAD SILICONE BREAST IMPLANTS AND THE LEFT IMPLANT IS LEAKING SLIGHTLY   Cellulitis of right middle finger 11/07/2018   [place]ervical cancer [place] 12/23/2018   Chest pain    CHF with right heart failure ([facility] 04/17/2017   Chronic respiratory failure with hypoxia (HCC)  12/23/2018   Cough variant asthma 04/13/2019   Depression    GERD (gastroesophageal reflux disease)    takes Dexilant and carafate and gi coctail    Headache    migraines on a daily and monthly regimen    Heart murmur    History of kidney stones    Hodgkin lymphoma ([facility]    STATUS POST MANTLE RADIATION   Hodgkin's lymphoma ([facility]    1987   Hypertension    Hypoxia    Necrotizing fasciitis [place] 12/23/2018   Non-ischemic cardiomyopathy ([facility]    Osteoporosis    Palpitations    Pituitary adenoma ([place] 12/23/2018   Pneumonia    PONV (postoperative nausea and vomiting)    Pre-diabetes    per pt; no meds   Pulmonary hypertension ([place] 12/23/2018   Raynaud phenomenon    Right heart failure ([place] 04/17/2017   Seizures ([facility]    last febrile seizure was approx 3 weeks ago per report on 12/01/2020   Sleep apnea    upcoming sleep study per pt    Supplemental oxygen dependent    3 liters   SVT (supraventricular tachycardia)    Tachycardia    Thyroid cancer ([facility]    STATUS POST SURGICAL REMOVAL-CURRENT ON THYROID REPLACEMENT    Significant Hospital Events: Including procedures, antibiotic start and stop dates in addition to other pertinent events   10/1 admitted 10/3 PCCM consulted for recurrent bronchospasms 10/4 transferred to step down unit for rapid response  Interim History / Subjective:   Magnesium bolus, extra prn breathing treatment, and lidocaine nebs last night.  Feels about the  same today.  Objective   Blood pressure (!) 150/79, pulse 94, temperature 98.3 F (36.8 C), temperature source Axillary, resp. rate 19, height _0  (1.651 m), weight 88.6 kg, SpO2 100 %.        Intake/Output Summary (Last 24 hours) at 07/06/2022 0902 Last data filed at 07/06/2022 4854 Gross per 24 hour  Intake 39.02 ml  Output --  Net 39.02 ml   Filed Weights   07/04/22 0902 07/05/22 0500 07/06/22 0409  Weight: 81.3 kg 88.5 kg 88.6 kg    Examination: General appearance: 51 y.o., female,  NAD, conversant  Eyes: tracking appropriately HENT: NCAT; MMM Lungs: crackles left base > R base, upper airway wheeze, with normal respiratory effort CV: tachy RR, no murmur  Abdomen: Soft, non-tender; non-distended, BS present  Extremities: No peripheral edema, warm Skin: Normal turgor and texture; no rash Neuro: Attentive, no focal deficit    Labs/imaging reviewed: Sputum culture 10/3 pending  No new imaging  Resolved Hospital Problem list     Assessment & Plan:  Viral Pneumonia/Bronchitis, rhinovirus+ Severe Persistent Asthma with Exacerbation Chronic Hypoxemic Respiratory Failure Adrenal Insufficiency Anxiety/Depression GERD  Discussion: Patient has rhinovirus pneumonia/bronchitis with severe asthma exacerbation. She has upper airway wheeze concerning for questionable upper airway inflammation vs vocal cord dysfunction. She has significant anxiety component to her presentation as well. Although previous bronchoscopy not notable for excessive dynamic airway compression/tracheobronchomalacia her HRCT in 2021 was suggestive of EDAC which could be contributing to her upper airway wheeze.  Plan: - continue oral prednisone taper, can add on her home hydrocort towards end of taper - continue budesonide + brovana twice daily and yupelri daily - stop hypertonic saline nebs twice daily in case this is contributing to bronchospasm/laryngospasm - PRN duonebs q4hrs - PRN lidocaine nebulizer treatment for cough - She is on pantoprazole 33m daily, continue 234mfamotidine at bedtime 10/3 - Recommend eating at least 2 hours before bed, sleep with head of bed 30 degrees or higher. Recommend reducing her soda intake. - Augmentin per ID  PCCM will continue to follow   Best Practice (right click and "Reselect all SmartList Selections" daily)   Per primary team  Critical care time: n/[place] personal pager PCCM on  call pager ([phone] 7pm. Please call Elink 7p-7a. [phone]

## 2022-07-06 NOTE — Progress Notes (Signed)
eLink Physician-Brief Progress Note Patient Name: [name] DOB: 1970/11/18 MRN: [id number]   Date of Service  07/06/2022  HPI/Events of Note  Patient c/o "bronchospasm" and has persistent cough. Patient has already received Duoneb treatment and is on a Prednisone taper. Sat currently = 99% and RR = 18-26.  eICU Interventions  Plan: Magnesium 2 gm IV now. Albuterol 2.5 mg via neb Q 3 hours PRN SOB or wheezing.  Lidocaine 2% 2 mL via neb now.     Intervention Category Major Interventions: Other:  Emonie Espericueta Cornelia Copa 07/06/2022, 5:11 AM

## 2022-07-06 NOTE — Progress Notes (Signed)
PROGRESS NOTE    [name]  XLK:[id number] DOB: 05-Aug-1971 DOA: 07/01/2022 PCP: Su Grand, MD    Brief Narrative:  51 year old with history of extensive multiple cancers, chronic hypoxemia on 4 L oxygen, Addison's disease, GERD, migraine, adrenal insufficiency, hypothyroidism anxiety depression chronic pain syndrome and recurrent admissions, recent bacteremia presented with feeling weak, fatigue and fever at home, shortness of breath not improved with inhalers at home.  In the emergency room temperature 102.3, WBC count 16.5, blood pressures 90/58, lactic acid 3.  Chest x-ray with left lower lobe infiltrate.  Admitted and treated as sepsis due to pneumonia.  Rapid responses 10/2 evening and night, 10/3 for intractable cough and bronchospasm.  Anxiety attacks after starting cough. Rapid response 10/4 morning for rigors that was alerted as seizures.  See below. Viral panel positive for rhinovirus. Coughing fits and anxiety attack 10/6 morning.  PCCM continues to follow.   Assessment & Plan:   Sepsis present on admission secondary to left lower lobe pneumonia, immunocompromised patient.  Hypoxemia, lactic acidosis and leukocytosis.  Respiratory virus panel positive for rhinovirus. Severe asthma exacerbation secondary to above. COVID-19 influenza negative.  Blood cultures and sputum cultures negative.  Treated with Rocephin and azithromycin.  Now on Augmentin.  We will continue total 10 days of therapy as recommended. Chest physiotherapy, incentive spirometry, deep breathing exercises, sputum induction, mucolytic's and bronchodilators. Patient did well with hypertonic saline nebulizer.  Pulmonary recommended to continue Pulmicort nebulizer, Brovana and Yupelri.  Will prescribe on discharge. Currently on prednisone taper, she will continue prednisone taper before restarting her home dose of steroids.  Acute renal failure: Treated with IV fluids.  Hemodynamics improved.  Levels  improved.  Started back on Bumex.  Hypokalemia: Replaced.  Magnesium is adequate.  Chronic hypoxemic respiratory failure: Continuing saturations on 3 to 4 L oxygen.  At baseline.  Saturation will need to be 90 to 92%.  Chronic diastolic heart failure, pulmonary hypertension:beta-blockers diuretics.  Resumed beta-blockers and Bumex.   Adrenal insufficiency: Currently on prednisone taper.  She will resume Solu-Cortef from her home doses 20/10 mg after completing prednisone.  Anxiety, depression, insomnia.  Multiple pain issues and nausea. Symptomatic treatment. Resume Klonopin, Wellbutrin, Marinol, Lexapro, Lamictal and Ambien. Decrease and taper off injectable narcotics and benzos.  Hyperglycemia associated with steroid use: Patient requested to discontinue sliding scale insulin.  Resume Januvia.   Shaking/tremors: No evidence of seizure disorder.  EEG was normal.  CT head was normal.  Mobilize.  Out of bed.  Continue to monitor.  Given her underlying anxiety attacks and previous multiple issues it is difficult to control her symptoms when she starts coughing. Stay at stepdown unit due to significant and frequent intervention needed.  DVT prophylaxis: enoxaparin (LOVENOX) injection 40 mg Start: 07/01/22 2200   Code Status: DNR with full scope of treatment Family Communication: None today. Disposition Plan: Status is: Inpatient Remains inpatient appropriate because: Significant respiratory symptoms.     Consultants:  Infectious disease Pulmonary  Procedures:  None  Antimicrobials:  Cefepime and azithromycin 10/1--- 10/3 Augmentin 10/3---   Subjective:  Seen and examined.  Eating breakfast.  Alert awake anxious appropriately. She started describing overnight symptoms which were severe bronchospasms and relieved with lidocaine nebulizer and magnesium.  [place]  We discussed about getting more  mobility today and deciding going home next few days. Patient was worried about fingersticks, she requested to discontinue fingersticks.  Her blood sugars has remained fairly stable.  We will discontinue.  Objective: Vitals:   07/06/22 0800 07/06/22 0831 07/06/22 0834 07/06/22 0900  BP:   (!) 150/79   Pulse: 93  94 93  Resp: _0 Temp:  98.3 F (36.8 C)    TempSrc:  Axillary    SpO2: 100%  100% 97%  Weight:      Height:        Intake/Output Summary (Last 24 hours) at 07/06/2022 1021 Last data filed at 07/06/2022 1000 Gross per 24 hour  Intake 279.02 ml  Output --  Net 279.02 ml   Filed Weights   07/04/22 0902 07/05/22 0500 07/06/22 0409  Weight: 81.3 kg 88.5 kg 88.6 kg    Examination:  General: Looks fairly comfortable talking.  Appropriately anxious and tremulous. Not in any distress.  Able to talk in full sentences.  Coughs in between. Cardiovascular: S1 and S2 normal.  Regular rate rhythm. Respiratory: Bilateral clear.  She does have dry cough induced with deep breathing. Gastrointestinal: Soft.  Nontender.  Bowel sound present. Ext: Patient does have chronic lymphedematous changes on her dorsum of hands and legs, no pitting edema. Neuro: Alert and x4.  No focal neurological deficits. Musculoskeletal: No deformities.      Data Reviewed: I have personally reviewed following labs and imaging studies  CBC: Recent Labs  Lab 07/01/22 1336 07/03/22 0529 07/06/22 0820  WBC 16.5* 17.8* 10.7*  NEUTROABS 14.1* 15.6* 7.5  HGB 9.8* 8.7* 8.0*  HCT 32.2* 29.4* 26.6*  MCV 80.7 82.8 83.1  PLT 328 328 706   Basic Metabolic Panel: Recent Labs  Lab 07/01/22 1336 07/03/22 0529 07/06/22 0820  NA 139 139 139  K 3.0* 4.2 2.8*  CL 100 106 103  CO2 _1 GLUCOSE 138* 160* 89  BUN 25* 30* 25*  CREATININE 1.55* 1.10* 1.13*  CALCIUM 8.9 9.3 8.4*  MG  --  2.3 2.7*  PHOS  --  3.4  --    GFR: Estimated Creatinine Clearance: 64.7 mL/min (A) (by C-G formula  based on SCr of 1.13 mg/dL (H)). Liver Function Tests: Recent Labs  Lab 07/01/22 1336  AST 32  ALT 29  ALKPHOS 70  BILITOT 0.7  PROT 7.6  ALBUMIN 4.0   Recent Labs  Lab 07/01/22 1336  LIPASE 44   No results for input(s): "AMMONIA" in the last 168 hours. Coagulation Profile: No results for input(s): "INR", "PROTIME" in the last 168 hours. Cardiac Enzymes: No results for input(s): "CKTOTAL", "CKMB", "CKMBINDEX", "TROPONINI" in the last 168 hours. BNP (last 3 results) No results for input(s): "PROBNP" in the last 8760 hours. HbA1C: No results for input(s): "HGBA1C" in the last 72 hours. CBG: Recent Labs  Lab 07/05/22 1705 07/05/22 2019 07/05/22 2110 07/06/22 0446 07/06/22 0813  GLUCAP 208* 61* 117* 112* 82   Lipid Profile: No results for input(s): "CHOL", "HDL", "LDLCALC", "TRIG", "CHOLHDL", "LDLDIRECT" in the last 72 hours. Thyroid Function Tests: No results for input(s): "TSH", "T4TOTAL", "FREET4", "T3FREE", "THYROIDAB" in the last 72 hours. Anemia Panel: No results for input(s): "VITAMINB12", "FOLATE", "FERRITIN", "TIBC", "IRON", "RETICCTPCT" in the last 72 hours. Sepsis Labs: Recent Labs  Lab 07/01/22 1512  LATICACIDVEN 3.0*    Recent Results (from the past 240 hour(s))  Culture, blood (routine x 2)     Status: None   Collection Time: 07/01/22 12:57 PM   Specimen: BLOOD  Result Value Ref Range Status   Specimen Description  Final    BLOOD RIGHT ANTECUBITAL Performed at [facility] [address] [place], [place] [address]    Special Requests   Final    BOTTLES DRAWN AEROBIC ONLY Blood Culture adequate volume Performed at [facility] [address] [place], [place] [address]    Culture   Final    NO GROWTH 5 DAYS Performed at [facility], [place] [address], [place], [place] [address]    Report Status 07/06/2022 FINAL  Final  Culture, blood (routine x 2)     Status: None   Collection Time: 07/01/22  12:57 PM   Specimen: BLOOD  Result Value Ref Range Status   Specimen Description   Final    BLOOD LEFT ANTECUBITAL Performed at [facility] [address], [place], [place] [address]    Special Requests   Final    BOTTLES DRAWN AEROBIC AND ANAEROBIC Blood Culture adequate volume Performed at [facility] [address] [place], [place] [address]    Culture   Final    NO GROWTH 5 DAYS Performed at [facility], [place] [address] [place], [place] [address]    Report Status 07/06/2022 FINAL  Final  Resp Panel by RT-PCR (Flu A&B, Covid) Peripheral     Status: None   Collection Time: 07/01/22  1:39 PM   Specimen: Peripheral; Nasal Swab  Result Value Ref Range Status   SARS Coronavirus 2 by RT PCR NEGATIVE NEGATIVE Final    Comment: (NOTE) SARS-CoV-2 target nucleic acids are NOT DETECTED.  The SARS-CoV-2 RNA is generally detectable in upper respiratory specimens during the acute phase of infection. The lowest concentration of SARS-CoV-2 viral copies this assay can detect is 138 copies/mL. A negative result does not preclude SARS-Cov-2 infection and should not be used as the sole basis for treatment or other patient management decisions. A negative result may occur with  improper specimen collection/handling, submission of specimen other than nasopharyngeal swab, presence of viral mutation(s) within the areas targeted by this assay, and inadequate number of viral copies(<138 copies/mL). A negative result must be combined with clinical observations, patient history, and epidemiological information. The expected result is Negative.  Fact Sheet for Patients:  EntrepreneurPulse.com.au  Fact Sheet for Healthcare Providers:  IncredibleEmployment.be  This test is no t yet approved or cleared by the [place] FDA and  has been authorized for detection and/or diagnosis of SARS-CoV-2 by FDA under an  Emergency Use Authorization (EUA). This EUA will remain  in effect (meaning this test can be used) for the duration of the COVID-19 declaration under Section 564(b)(1) of the Act, 21 U.S.C.section 360bbb-3(b)(1), unless the authorization is terminated  or revoked sooner.       Influenza A by PCR NEGATIVE NEGATIVE Final   Influenza B by PCR NEGATIVE NEGATIVE Final    Comment: (NOTE) The Xpert Xpress SARS-CoV-2/FLU/RSV plus assay is intended as an aid in the diagnosis of influenza from Nasopharyngeal swab specimens and should not be used as a sole basis for treatment. Nasal washings and aspirates are unacceptable for Xpert Xpress SARS-CoV-2/FLU/RSV testing.  Fact Sheet for Patients: EntrepreneurPulse.com.au  Fact Sheet for Healthcare Providers: IncredibleEmployment.be  This test is not yet approved or cleared by the [place] FDA and has been authorized for detection and/or diagnosis of SARS-CoV-2 by FDA under an Emergency Use Authorization (EUA). This EUA will remain in effect (meaning this test can be used) for the duration of the COVID-19  declaration under Section 564(b)(1) of the Act, 21 U.S.C. section 360bbb-3(b)(1), unless the authorization is terminated or revoked.  Performed at [facility], [place] [address] [place], [place] [address]   Expectorated Sputum Assessment w Gram Stain, Rflx to Resp Cult     Status: None   Collection Time: 07/03/22  6:36 AM   Specimen: Expectorated Sputum  Result Value Ref Range Status   Specimen Description EXPECTORATED SPUTUM  Final   Special Requests NONE  Final   Sputum evaluation   Final    THIS SPECIMEN IS ACCEPTABLE FOR SPUTUM CULTURE Performed at [facility], [place] [address], [place], [place] [address]    Report Status 07/03/2022 FINAL  Final  Culture, Respiratory w Gram Stain     Status: None (Preliminary result)   Collection Time: 07/03/22  6:36 AM   Result Value Ref Range Status   Specimen Description   Final    EXPECTORATED SPUTUM Performed at [facility] [address], [place], [place] [address]    Special Requests   Final    NONE Reflexed from [id number] Performed at [facility], [place] [address], [place], [place] [address]    Gram Stain   Final    FEW WBC PRESENT,BOTH PMN AND MONONUCLEAR FEW BUDDING YEAST SEEN RARE GRAM POSITIVE RODS    Culture   Final    CULTURE REINCUBATED FOR BETTER GROWTH Performed at [facility], [place] [address], [place], [place] [address]    Report Status PENDING  Incomplete  Respiratory (~20 pathogens) panel by PCR     Status: Abnormal   Collection Time: 07/03/22  6:55 PM   Specimen: Nasopharyngeal Swab; Respiratory  Result Value Ref Range Status   Adenovirus NOT DETECTED NOT DETECTED Final   Coronavirus 229E NOT DETECTED NOT DETECTED Final    Comment: (NOTE) The Coronavirus on the Respiratory Panel, DOES NOT test for the novel  Coronavirus (2019 nCoV)    Coronavirus HKU1 NOT DETECTED NOT DETECTED Final   Coronavirus NL63 NOT DETECTED NOT DETECTED Final   Coronavirus OC43 NOT DETECTED NOT DETECTED Final   Metapneumovirus NOT DETECTED NOT DETECTED Final   Rhinovirus / Enterovirus DETECTED (A) NOT DETECTED Final   Influenza A NOT DETECTED NOT DETECTED Final   Influenza B NOT DETECTED NOT DETECTED Final   Parainfluenza Virus 1 NOT DETECTED NOT DETECTED Final   Parainfluenza Virus 2 NOT DETECTED NOT DETECTED Final   Parainfluenza Virus 3 NOT DETECTED NOT DETECTED Final   Parainfluenza Virus 4 NOT DETECTED NOT DETECTED Final   Respiratory Syncytial Virus NOT DETECTED NOT DETECTED Final   Bordetella pertussis NOT DETECTED NOT DETECTED Final   Bordetella Parapertussis NOT DETECTED NOT DETECTED Final   Chlamydophila pneumoniae NOT DETECTED NOT DETECTED Final   Mycoplasma pneumoniae NOT DETECTED NOT DETECTED Final    Comment: Performed at [facility], [place]. [address] [place],  [address]  MRSA Next Gen by PCR, Nasal     Status: None   Collection Time: 07/03/22  6:58 PM   Specimen: Nasal Mucosa; Nasal Swab  Result Value Ref Range Status   MRSA by PCR Next Gen NOT DETECTED NOT DETECTED Final    Comment: (NOTE) The GeneXpert MRSA Assay (FDA approved for NASAL specimens only), is one component of a comprehensive MRSA colonization surveillance program. It is not intended to diagnose MRSA infection nor to guide or monitor treatment for MRSA infections. Test performance is not FDA approved in  patients less than 44 years old. Performed at [facility], [place] [address], [place], [place] [address]          Radiology Studies: EEG adult  Result Date: 07/22/22 Lora Havens, MD     2022-07-22  8:49 AM Patient Name: [name] MRN: [id number] Epilepsy Attending: Lora Havens Referring Physician/Provider: Barb Merino, MD Date: 07/04/2022 Duration: 24.40 mins Patient history: 51yo F noted to have shaking and tremors with episodes of unresponsiveness today morning, on examination at bedside breathing normal, 100% on 3 L oxygen, patient is with flickering eyes, she will withdraw to pain, tone normal.  No evidence of tonic-clonic seizure. EEG to evaluate for seizure. Level of alertness: Awake AEDs during EEG study:  TPM, LTG Technical aspects: This EEG study was done with scalp electrodes positioned according to the 10-20 International system of electrode placement. Electrical activity was reviewed with band pass filter of 1-70Hz, sensitivity of 7 uV/mm, display speed of 89m/sec with a 60Hz notched filter applied as appropriate. EEG data were recorded continuously and digitally stored.  Video monitoring was available and reviewed as appropriate. Description: The posterior dominant rhythm consists of 9-10 Hz activity of moderate voltage (25-35 uV) seen predominantly in posterior head regions, symmetric and  reactive to eye opening and eye closing. Physiologic photic driving was seen during photic stimulation.  Hyperventilation was not performed.   Of note, parts of study were technically difficult due to significant movement artifact. IMPRESSION: This study is within normal limits. No seizures or epileptiform discharges were seen throughout the recording. A normal interictal EEG does not exclude the diagnosis of epilepsy. Priyanka OBarbra Sarks  CT HEAD WO CONTRAST (5MM)  Result Date: 07/04/2022 CLINICAL DATA:  Mental status changes EXAM: CT HEAD WITHOUT CONTRAST TECHNIQUE: Contiguous axial images were obtained from the base of the skull through the vertex without intravenous contrast. RADIATION DOSE REDUCTION: This exam was performed according to the departmental dose-optimization program which includes automated exposure control, adjustment of the mA and/or kV according to patient size and/or use of iterative reconstruction technique. COMPARISON:  02/10/2020 FINDINGS: Brain: No evidence of acute infarction, hemorrhage, hydrocephalus, extra-axial collection or mass lesion/mass effect. Vascular: No hyperdense vessel or unexpected calcification. Skull: No osseous abnormality. Sinuses/Orbits: Visualized paranasal sinuses are clear. Visualized mastoid sinuses are clear. Visualized orbits demonstrate no focal abnormality. Other: None IMPRESSION: No acute intracranial findings. Electronically Signed   By: HKathreen DevoidM.D.   On: 07/04/2022 15:23        Scheduled Meds:  amoxicillin-clavulanate  875 mg Oral Q12H   arformoterol  15 mcg Nebulization BID   budesonide (PULMICORT) nebulizer solution  0.5 mg Nebulization BID   bumetanide  1 mg Oral BID   buPROPion  150 mg Oral Daily   Chlorhexidine Gluconate Cloth  6 each Topical Daily   enoxaparin (LOVENOX) injection  40 mg Subcutaneous Q24H   escitalopram  20 mg Oral QPM   famotidine  20 mg Oral QHS   fluconazole  150 mg Oral Once   guaiFENesin  15 mL Oral QID    insulin aspart  0-5 Units Subcutaneous QHS   insulin aspart  0-9 Units Subcutaneous TID WC   lamoTRIgine  150 mg Oral QHS   levothyroxine  100 mcg Oral q morning   montelukast  10 mg Oral QHS   pantoprazole  40 mg Oral Daily   potassium chloride  40 mEq Oral BID   predniSONE  40 mg Oral Q breakfast  Followed by   Derrill Memo ON 07/08/2022] predniSONE  30 mg Oral Q breakfast   Followed by   Derrill Memo ON 07/12/2022] predniSONE  20 mg Oral Q breakfast   Followed by   Derrill Memo ON 07/16/2022] predniSONE  10 mg Oral Q breakfast   Followed by   Derrill Memo ON 07/20/2022] predniSONE  5 mg Oral Q breakfast   revefenacin  175 mcg Nebulization Daily   topiramate  150 mg Oral QHS   Continuous Infusions:     LOS: 5 days    Time spent: 35 minutes    Barb Merino, MD [facility] Pager [phone]

## 2022-07-07 ENCOUNTER — Other Ambulatory Visit: Payer: Self-pay

## 2022-07-07 DIAGNOSIS — N179 Acute kidney failure, unspecified: Secondary | ICD-10-CM | POA: Diagnosis not present

## 2022-07-07 DIAGNOSIS — J189 Pneumonia, unspecified organism: Secondary | ICD-10-CM | POA: Diagnosis not present

## 2022-07-07 DIAGNOSIS — J4551 Severe persistent asthma with (acute) exacerbation: Secondary | ICD-10-CM | POA: Diagnosis not present

## 2022-07-07 DIAGNOSIS — E2740 Unspecified adrenocortical insufficiency: Secondary | ICD-10-CM | POA: Diagnosis not present

## 2022-07-07 LAB — GLUCOSE, CAPILLARY: Glucose-Capillary: 160 mg/dL — ABNORMAL HIGH (ref 70–99)

## 2022-07-07 NOTE — Progress Notes (Signed)
NAME:  [name], MRN:  [id number], DOB:  [birth year], LOS: 56 ADMISSION DATE:  07/01/2022, CONSULTATION DATE:  07/03/22 REFERRING MD:  Barb Merino, MD CHIEF COMPLAINT:  Bronchospasms   History of Present Illness:  [name] is a 51 year old woman with history of hodgkin's lymphoma, breast cancer, cervical cancer, thyroid cancer, hypothyroidism, addison's disease, GERD, asthma, chronic hypoxemic respiratory failure, HFpEF, hypertension, anxiety/depression, and chronic pain syndrome who was admitted for sepsis due to pneumonia on 07/01/22. PCCM is consulted for ongoing bronchospasms.   She has dyspnea, wheezing, intermittent sensation of her throat narrowing. She had ENT evaluation last year and flexible laryngoscopy was unremarkable. She reports her symptoms are worse than her usual asthma exacerbation. She reports fevers earlier on during the onset of these symptoms.  Respiratory culture is showing few budding yeast and rare gram positive rods. Influenza and covid negative on admission. CXR on admission showed left lower lobe opacity. She was started on cefepime and azithromycin, changed cefepime to ceftriaxone and now taking augmentin.   She was last seen in pulmonary clinic 02/02/22 with transition to Advair 230-59mg 2 puffs twice daily. She reports being on nucala in the past with improvement of her breathing.  Pertinent  Medical History   Past Medical History:  Diagnosis Date   Addison's disease ([facility]    Adrenal insufficiency ([facility]    Anemia    Anxiety    Aortic stenosis    Aortic stenosis    Appendicitis 12/19/09   Appendicitis    Breast cancer (HCC)    STATUS POST BILATERAL MASTECTOMY. STATUS POST RECONSTRUCTION. SHE HAD SILICONE BREAST IMPLANTS AND THE LEFT IMPLANT IS LEAKING SLIGHTLY   Cellulitis of right middle finger 11/07/2018   Cervical cancer ([facility] 12/23/2018   Chest pain    CHF with right heart failure ([facility] 04/17/2017   Chronic respiratory failure with hypoxia (HCC)  12/23/2018   Cough variant asthma 04/13/2019   Depression    GERD (gastroesophageal reflux disease)    takes Dexilant and carafate and gi coctail    Headache    migraines on a daily and monthly regimen    Heart murmur    History of kidney stones    Hodgkin lymphoma ([facility]    STATUS POST MANTLE RADIATION   Hodgkin's lymphoma ([facility]    1987   Hypertension    Hypoxia    Necrotizing fasciitis ([facility] 12/23/2018   Non-ischemic cardiomyopathy ([facility]    Osteoporosis    Palpitations    Pituitary adenoma ([facility] 12/23/2018   Pneumonia    PONV (postoperative nausea and vomiting)    Pre-diabetes    per pt; no meds   Pulmonary hypertension ([facility] [place] 12/23/2018   Raynaud phenomenon    Right heart failure ([facility] 04/17/2017   Seizures ([facility]    last febrile seizure was approx 3 weeks ago per report on 12/01/2020   Sleep apnea    upcoming sleep study per pt    Supplemental oxygen dependent    3 liters   SVT (supraventricular tachycardia)    Tachycardia    Thyroid cancer (HE[facility]    STATUS POST SURGICAL REMOVAL-CURRENT ON THYROID REPLACEMENT    Significant Hospital Events: Including procedures, antibiotic start and stop dates in addition to other pertinent events   10/1 admitted 10/3 PCCM consulted for recurrent bronchospasms 10/4 transferred to step down unit for rapid response  Interim History / Subjective:   Slow but steady improvement. Mentions that a lot of her coughing fits are at night  and happen when she wakes from sleep dyspneic. Has had what sound like witnessed apneas at home. Very sleepy during the day.   Objective   Blood pressure (!) 142/68, pulse (!) 106, temperature 98.2 F (36.8 C), temperature source Oral, resp. rate (!) 23, height _0  (1.651 m), weight 88.6 kg, SpO2 96 %.        Intake/Output Summary (Last 24 hours) at 07/07/2022 0804 Last data filed at 07/06/2022 1413 Gross per 24 hour  Intake 480 ml  Output --  Net 480 ml   Filed Weights   07/05/22 0500 07/06/22 0409  07/07/22 0500  Weight: 88.5 kg 88.6 kg 88.6 kg    Examination: General appearance: 51 y.o., female, NAD, conversant Eyes: tracking HENT: NCAT; MMM Lungs: crackles left base > R base, upper airway wheeze, with normal respiratory effort CV: tachy RR, no murmur  Abdomen: Soft, non-tender; non-distended, BS present  Extremities: No edema, warm Neuro: attentive no focal deficit   Labs/imaging reviewed: Sputum culture 10/3 candida  No new imaging  Resolved Hospital Problem list     Assessment & Plan:  Viral Pneumonia/Bronchitis, rhinovirus+ Severe Persistent Asthma with Exacerbation Chronic Hypoxemic Respiratory Failure Adrenal Insufficiency Anxiety/Depression GERD 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 She has upper airway wheeze concerning for questionable upper airway inflammation vs vocal cord dysfunction. She has significant anxiety component to her presentation as well. Although previous bronchoscopy not notable for excessive dynamic airway compression/tracheobronchomalacia her HRCT in 2021 was suggestive of EDAC which could be contributing to her upper airway wheeze.  Plan: - continue oral prednisone taper, can add on her home hydrocort towards end of taper - continue budesonide + brovana twice daily and yupelri daily - scheduled guaifenesin - trial vest q4 while awake per pt request - PRN duonebs q4hrs - PRN lidocaine nebulizer treatment for cough - She is on pantoprazole 32m daily, continue 28mfamotidine at bedtime 10/3 - Recommend eating at least 2 hours before bed, sleep with head of bed 30 degrees or higher. Recommend reducing her soda intake. - Augmentin per ID - will plan to pursue home sleep study as outpatient  PCCM will continue to follow   Best Practice (right click and "Reselect all SmartList Selections" daily)   Per primary  team  Critical care time: n/[place] personal pager PCCM on call pager ([phone] 7pm. Please call Elink 7p-7a. [phone]

## 2022-07-07 NOTE — Progress Notes (Addendum)
PROGRESS NOTE    [name]  FXO:[id number] DOB: 08-Aug-1971 DOA: 07/01/2022 PCP: Su Grand, MD    Brief Narrative:   51 year old female with past medical history of multiple cancers with chronic hypoxemia on 4 L of oxygen by nasal cannula, history of Addison's disease, GERD, hypothyroidism, chronic pain syndrome with recurrent admissions to the hospital and recurrent admissions to the hospital presented hospital with fatigue weakness fever and shortness of breath not improved with inhalers at home.  In the emergency room temperature 102.3, WBC count 16.5, blood pressures 90/58, lactic acid 3.  Chest x-ray with left lower lobe infiltrate.  Patient was then admitted to the hospital for sepsis secondary to pneumonia.  Patient had rapid response this on admission for wheezing.  PCCM was consulted.  Viral panel was negative positive for rhinovirus.   Assessment & Plan:   Sepsis secondary to left lower lobe pneumonia, immunocompromised patient.  Patient did have hypoxemia lactic acidosis and leukocytosis.  Respiratory viral panel positive for rhinovirus.  COVID-19 negative.  Blood culture negative in 5 days.  Sputum with gram-positive rods and Candida.  On Augmentin.  Total 10-day plan of antibiotic.  Was initially on Rocephin and Zithromax.  Continue chest PT therapy, incentive spirometry bronchodilators.  Pulmonary on board.  Continue Pulmicort nebulizer Candiss Norse.  Will prescribe on discharge. Currently on prednisone taper, she will continue prednisone taper before restarting her home dose of steroids.  Acute kidney injury improved with IV fluids.  Started back on Bumex.  Continue to monitor BMP.  Hypokalemia: Replenished yesterday no labs for today.  Check levels in AM.  Chronic hypoxemic respiratory failure: On 4 L of oxygen at baseline.    Chronic diastolic heart failure, pulmonary hypertension: Continue beta-blockers diuretics.   Adrenal insufficiency:  Currently on prednisone  taper.  She will resume Solu-Cortef from her home doses 20/10 mg after completing prednisone.  Anxiety, depression, insomnia.  Multiple pain issues and nausea. ON  Klonopin, Wellbutrin, Marinol, Lexapro, Lamictal and Ambien.  On oral and IV Dilaudid.  Will taper as able.  Hyperglycemia associated with steroid use:  On sliding scale insulin  Tradjenta   Shaking/tremors: No evidence of seizure disorder.  EEG was normal.  CT head was normal.   DVT prophylaxis: enoxaparin (LOVENOX) injection 40 mg Start: 07/01/22 2200  Code Status: DNR   Family Communication:  Spoke with the patient's husband and son at bedside  Disposition Plan: Status is: Inpatient  Remains inpatient appropriate because: Significant respiratory symptoms, pulmonary following, slow clinical improvement.    Consultants:  Infectious disease Pulmonary  Procedures:  None  Antimicrobials:  Cefepime and azithromycin 10/1--- 10/3 Augmentin 10/3---   Subjective:  Today, patient was seen and examined at bedside.  Patient still feels need for coughing, anxiety and suffocation feeling better with chest physiotherapy.  Objective: Vitals:   07/06/22 2026 07/07/22 0000 07/07/22 0400 07/07/22 0500  BP:  (!) 142/75 (!) 142/68   Pulse:      Resp:      Temp:  98.7 F (37.1 C) 98.2 F (36.8 C)   TempSrc:  Oral Oral   SpO2: 96%     Weight:    88.6 kg  Height:        Intake/Output Summary (Last 24 hours) at 07/07/2022 0740 Last data filed at 07/06/2022 1413 Gross per 24 hour  Intake 480 ml  Output --  Net 480 ml    Filed Weights   07/05/22 0500 07/06/22 0409 07/07/22 0500  Weight:  88.5 kg 88.6 kg 88.6 kg    Physical examination:  General:  Average built,  on nasal cannula oxygen, not in obvious distress, mildly anxious HENT:   No scleral pallor or icterus noted. Oral mucosa is moist.  Chest: Diminished breath sounds bilaterally.  Coarse breath sounds noted CVS: S1 &S2 heard. No murmur.  Regular rate and  rhythm. Abdomen: Soft, nontender, nondistended.  Bowel sounds are heard.   Extremities: No cyanosis, clubbing or edema.  Peripheral pulses are palpable. Psych: Alert, awake and oriented, normal mood CNS:  No cranial nerve deficits.  Power equal in all extremities.   Skin: Warm and dry.  No rashes noted.    Data Reviewed: I have personally reviewed following labs and imaging studies  CBC: Recent Labs  Lab 07/01/22 1336 07/03/22 0529 07/06/22 0820  WBC 16.5* 17.8* 10.7*  NEUTROABS 14.1* 15.6* 7.5  HGB 9.8* 8.7* 8.0*  HCT 32.2* 29.4* 26.6*  MCV 80.7 82.8 83.1  PLT 328 328 517    Basic Metabolic Panel: Recent Labs  Lab 07/01/22 1336 07/03/22 0529 07/06/22 0820  NA 139 139 139  K 3.0* 4.2 2.8*  CL 100 106 103  CO2 _0 GLUCOSE 138* 160* 89  BUN 25* 30* 25*  CREATININE 1.55* 1.10* 1.13*  CALCIUM 8.9 9.3 8.4*  MG  --  2.3 2.7*  PHOS  --  3.4  --     GFR: Estimated Creatinine Clearance: 64.7 mL/min (A) (by C-G formula based on SCr of 1.13 mg/dL (H)). Liver Function Tests: Recent Labs  Lab 07/01/22 1336  AST 32  ALT 29  ALKPHOS 70  BILITOT 0.7  PROT 7.6  ALBUMIN 4.0    Recent Labs  Lab 07/01/22 1336  LIPASE 44    No results for input(s): "AMMONIA" in the last 168 hours. Coagulation Profile: No results for input(s): "INR", "PROTIME" in the last 168 hours. Cardiac Enzymes: No results for input(s): "CKTOTAL", "CKMB", "CKMBINDEX", "TROPONINI" in the last 168 hours. BNP (last 3 results) No results for input(s): "PROBNP" in the last 8760 hours. HbA1C: No results for input(s): "HGBA1C" in the last 72 hours. CBG: Recent Labs  Lab 07/05/22 1705 07/05/22 2019 07/05/22 2110 07/06/22 0446 07/06/22 0813  GLUCAP 208* 61* 117* 112* 82    Lipid Profile: No results for input(s): "CHOL", "HDL", "LDLCALC", "TRIG", "CHOLHDL", "LDLDIRECT" in the last 72 hours. Thyroid Function Tests: No results for input(s): "TSH", "T4TOTAL", "FREET4", "T3FREE",  "THYROIDAB" in the last 72 hours. Anemia Panel: No results for input(s): "VITAMINB12", "FOLATE", "FERRITIN", "TIBC", "IRON", "RETICCTPCT" in the last 72 hours. Sepsis Labs: Recent Labs  Lab 07/01/22 1512  LATICACIDVEN 3.0*     Recent Results (from the past 240 hour(s))  Culture, blood (routine x 2)     Status: None   Collection Time: 07/01/22 12:57 PM   Specimen: BLOOD  Result Value Ref Range Status   Specimen Description   Final    BLOOD RIGHT ANTECUBITAL Performed at [facility] [address], [place], [place] [address]    Special Requests   Final    BOTTLES DRAWN AEROBIC ONLY Blood Culture adequate volume Performed at [facility] [address], [place], [place] [address]    Culture   Final    NO GROWTH 5 DAYS Performed at [facility], [place] [address] [place], [place] [address]    Report Status 07/06/2022 FINAL  Final  Culture, blood (routine x 2)     Status:  None   Collection Time: 07/01/22 12:57 PM   Specimen: BLOOD  Result Value Ref Range Status   Specimen Description   Final    BLOOD LEFT ANTECUBITAL Performed at [facility] [address], [place], [place] [address]    Special Requests   Final    BOTTLES DRAWN AEROBIC AND ANAEROBIC Blood Culture adequate volume Performed at [facility] [address] [place], [place] [address]    Culture   Final    NO GROWTH 5 DAYS Performed at [facility], [place] [address] [place], [place] [address]    Report Status 07/06/2022 FINAL  Final  Resp Panel by RT-PCR (Flu A&B, Covid) Peripheral     Status: None   Collection Time: 07/01/22  1:39 PM   Specimen: Peripheral; Nasal Swab  Result Value Ref Range Status   SARS Coronavirus 2 by RT PCR NEGATIVE NEGATIVE Final    Comment: (NOTE) SARS-CoV-2 target nucleic acids are NOT DETECTED.  The SARS-CoV-2 RNA is generally detectable in upper respiratory specimens during the acute  phase of infection. The lowest concentration of SARS-CoV-2 viral copies this assay can detect is 138 copies/mL. A negative result does not preclude SARS-Cov-2 infection and should not be used as the sole basis for treatment or other patient management decisions. A negative result may occur with  improper specimen collection/handling, submission of specimen other than nasopharyngeal swab, presence of viral mutation(s) within the areas targeted by this assay, and inadequate number of viral copies(<138 copies/mL). A negative result must be combined with clinical observations, patient history, and epidemiological information. The expected result is Negative.  Fact Sheet for Patients:  EntrepreneurPulse.com.au  Fact Sheet for Healthcare Providers:  IncredibleEmployment.be  This test is no t yet approved or cleared by the [place] FDA and  has been authorized for detection and/or diagnosis of SARS-CoV-2 by FDA under an Emergency Use Authorization (EUA). This EUA will remain  in effect (meaning this test can be used) for the duration of the COVID-19 declaration under Section 564(b)(1) of the Act, 21 U.S.C.section 360bbb-3(b)(1), unless the authorization is terminated  or revoked sooner.       Influenza A by PCR NEGATIVE NEGATIVE Final   Influenza B by PCR NEGATIVE NEGATIVE Final    Comment: (NOTE) The Xpert Xpress SARS-CoV-2/FLU/RSV plus assay is intended as an aid in the diagnosis of influenza from Nasopharyngeal swab specimens and should not be used as a sole basis for treatment. Nasal washings and aspirates are unacceptable for Xpert Xpress SARS-CoV-2/FLU/RSV testing.  Fact Sheet for Patients: EntrepreneurPulse.com.au  Fact Sheet for Healthcare Providers: IncredibleEmployment.be  This test is not yet approved or cleared by the [place] FDA and has been authorized for detection and/or diagnosis of  SARS-CoV-2 by FDA under an Emergency Use Authorization (EUA). This EUA will remain in effect (meaning this test can be used) for the duration of the COVID-19 declaration under Section 564(b)(1) of the Act, 21 U.S.C. section 360bbb-3(b)(1), unless the authorization is terminated or revoked.  Performed at [facility], [place] [address], [place], [place] [address]   Expectorated Sputum Assessment w Gram Stain, Rflx to Resp Cult     Status: None   Collection Time: 07/03/22  6:36 AM   Specimen: Expectorated Sputum  Result Value Ref Range Status   Specimen Description EXPECTORATED SPUTUM  Final   Special Requests NONE  Final   Sputum evaluation   Final    THIS SPECIMEN IS ACCEPTABLE FOR  SPUTUM CULTURE Performed at [facility], [place] [address] [place], [place] [address]    Report Status 07/03/2022 FINAL  Final  Culture, Respiratory w Gram Stain     Status: None   Collection Time: 07/03/22  6:36 AM  Result Value Ref Range Status   Specimen Description   Final    EXPECTORATED SPUTUM Performed at [facility], [place] [address] [place], [place] [address]    Special Requests   Final    NONE Reflexed from [phone] Performed at [facility], [place] [address], [place], [place] [address]    Gram Stain   Final    FEW WBC PRESENT,BOTH PMN AND MONONUCLEAR FEW BUDDING YEAST SEEN RARE GRAM POSITIVE RODS Performed at [facility], [place] [address] [place], [place] [address]    Culture FEW CANDIDA ALBICANS  Final   Report Status 07/06/2022 FINAL  Final  Respiratory (~20 pathogens) panel by PCR     Status: Abnormal   Collection Time: 07/03/22  6:55 PM   Specimen: Nasopharyngeal Swab; Respiratory  Result Value Ref Range Status   Adenovirus NOT DETECTED NOT DETECTED Final   Coronavirus 229E NOT DETECTED NOT DETECTED Final    Comment: (NOTE) The Coronavirus on the Respiratory Panel, DOES NOT test for the novel   Coronavirus (2019 nCoV)    Coronavirus HKU1 NOT DETECTED NOT DETECTED Final   Coronavirus NL63 NOT DETECTED NOT DETECTED Final   Coronavirus OC43 NOT DETECTED NOT DETECTED Final   Metapneumovirus NOT DETECTED NOT DETECTED Final   Rhinovirus / Enterovirus DETECTED (A) NOT DETECTED Final   Influenza A NOT DETECTED NOT DETECTED Final   Influenza B NOT DETECTED NOT DETECTED Final   Parainfluenza Virus 1 NOT DETECTED NOT DETECTED Final   Parainfluenza Virus 2 NOT DETECTED NOT DETECTED Final   Parainfluenza Virus 3 NOT DETECTED NOT DETECTED Final   Parainfluenza Virus 4 NOT DETECTED NOT DETECTED Final   Respiratory Syncytial Virus NOT DETECTED NOT DETECTED Final   Bordetella pertussis NOT DETECTED NOT DETECTED Final   Bordetella Parapertussis NOT DETECTED NOT DETECTED Final   Chlamydophila pneumoniae NOT DETECTED NOT DETECTED Final   Mycoplasma pneumoniae NOT DETECTED NOT DETECTED Final    Comment: Performed at [facility], [place] [address] [place], [place] [address]  MRSA Next Gen by PCR, Nasal     Status: None   Collection Time: 07/03/22  6:58 PM   Specimen: Nasal Mucosa; Nasal Swab  Result Value Ref Range Status   MRSA by PCR Next Gen NOT DETECTED NOT DETECTED Final    Comment: (NOTE) The GeneXpert MRSA Assay (FDA approved for NASAL specimens only), is one component of a comprehensive MRSA colonization surveillance program. It is not intended to diagnose MRSA infection nor to guide or monitor treatment for MRSA infections. Test performance is not FDA approved in patients less than 21 years old. Performed at [facility], [place] [address], [place], [place] [address]      Radiology Studies: EEG adult  Result Date: 07-26-2022 Lora Havens, MD     2022/07/26  8:49 AM Patient Name: DONICIA DRUCK MRN: [id number] Epilepsy Attending: Lora Havens Referring Physician/Provider: Barb Merino, MD Date: 07/04/2022 Duration: 24.40 mins Patient history: 51yo F  noted to have shaking and tremors with episodes of unresponsiveness today morning, on examination at bedside breathing normal, 100% on 3 L oxygen, patient is with flickering eyes, she will withdraw to pain, tone normal.  No  evidence of tonic-clonic seizure. EEG to evaluate for seizure. Level of alertness: Awake AEDs during EEG study:  TPM, LTG Technical aspects: This EEG study was done with scalp electrodes positioned according to the 10-20 International system of electrode placement. Electrical activity was reviewed with band pass filter of 1-_0 , sensitivity of 7 uV/mm, display speed of 25m/sec with a _1  notched filter applied as appropriate. EEG data were recorded continuously and digitally stored.  Video monitoring was available and reviewed as appropriate. Description: The posterior dominant rhythm consists of 9-10 Hz activity of moderate voltage (25-35 uV) seen predominantly in posterior head regions, symmetric and reactive to eye opening and eye closing. Physiologic photic driving was seen during photic stimulation.  Hyperventilation was not performed.   Of note, parts of study were technically difficult due to significant movement artifact. IMPRESSION: This study is within normal limits. No seizures or epileptiform discharges were seen throughout the recording. A normal interictal EEG does not exclude the diagnosis of epilepsy. Priyanka OBarbra Sarks    Scheduled Meds:  amoxicillin-clavulanate  875 mg Oral Q12H   arformoterol  15 mcg Nebulization BID   budesonide (PULMICORT) nebulizer solution  0.5 mg Nebulization BID   bumetanide  1 mg Oral BID   buPROPion  150 mg Oral Daily   Chlorhexidine Gluconate Cloth  6 each Topical Daily   enoxaparin (LOVENOX) injection  40 mg Subcutaneous Q24H   escitalopram  20 mg Oral QPM   famotidine  20 mg Oral QHS   fluconazole  150 mg Oral Once   guaiFENesin  15 mL Oral QID   lamoTRIgine  150 mg Oral QHS   levothyroxine  100 mcg Oral q morning   linagliptin  5  mg Oral Daily   montelukast  10 mg Oral QHS   pantoprazole  40 mg Oral Daily   potassium chloride  40 mEq Oral BID   predniSONE  40 mg Oral Q breakfast   Followed by   [Derrill MemoON 07/08/2022] predniSONE  30 mg Oral Q breakfast   Followed by   [Derrill MemoON 07/12/2022] predniSONE  20 mg Oral Q breakfast   Followed by   [Derrill MemoON 07/16/2022] predniSONE  10 mg Oral Q breakfast   Followed by   [Derrill MemoON 07/20/2022] predniSONE  5 mg Oral Q breakfast   revefenacin  175 mcg Nebulization Daily   saccharomyces boulardii  250 mg Oral BID   topiramate  150 mg Oral QHS   Continuous Infusions:     LOS: 6 days    LFlora Lipps MD [facility]

## 2022-07-08 DIAGNOSIS — J4551 Severe persistent asthma with (acute) exacerbation: Secondary | ICD-10-CM | POA: Diagnosis not present

## 2022-07-08 DIAGNOSIS — E2740 Unspecified adrenocortical insufficiency: Secondary | ICD-10-CM | POA: Diagnosis not present

## 2022-07-08 DIAGNOSIS — J189 Pneumonia, unspecified organism: Secondary | ICD-10-CM | POA: Diagnosis not present

## 2022-07-08 DIAGNOSIS — N179 Acute kidney failure, unspecified: Secondary | ICD-10-CM | POA: Diagnosis not present

## 2022-07-08 LAB — BASIC METABOLIC PANEL
Anion gap: 8 (ref 5–15)
BUN: 27 mg/dL — ABNORMAL HIGH (ref 6–20)
CO2: 26 mmol/L (ref 22–32)
Calcium: 9.4 mg/dL (ref 8.9–10.3)
Chloride: 102 mmol/L (ref 98–111)
Creatinine, Ser: 1.2 mg/dL — ABNORMAL HIGH (ref 0.44–1.00)
GFR, Estimated: 55 mL/min — ABNORMAL LOW (ref >60)
Glucose, Bld: 122 mg/dL — ABNORMAL HIGH (ref 70–99)
Potassium: 4.4 mmol/L (ref 3.5–5.1)
Sodium: 136 mmol/L (ref 135–145)

## 2022-07-08 LAB — MAGNESIUM: Magnesium: 2.3 mg/dL (ref 1.7–2.4)

## 2022-07-08 MED ORDER — HYDROMORPHONE HCL 1 MG/ML IJ SOLN
0.2500 mg | Freq: Four times a day (QID) | INTRAMUSCULAR | Status: DC | PRN
  Administered 2022-07-08 – 2022-07-09 (×3): 0.25 mg via INTRAVENOUS
  Filled 2022-07-08 (×3): qty 1

## 2022-07-08 MED ORDER — MAGIC MOUTHWASH W/LIDOCAINE
5.0000 mL | Freq: Four times a day (QID) | ORAL | Status: DC | PRN
  Administered 2022-07-08: 5 mL via ORAL
  Filled 2022-07-08 (×2): qty 5

## 2022-07-08 NOTE — Progress Notes (Signed)
Pt refused cpt at this time.

## 2022-07-08 NOTE — Progress Notes (Signed)
PROGRESS NOTE    [name]  FOY:[id number] DOB: [birth year] DOA: 07/01/2022 PCP: Su Grand, MD    Brief Narrative:   51 year old female with past medical history of multiple cancers with chronic hypoxemia on 4 L of oxygen by nasal cannula, history of Addison's disease, GERD, hypothyroidism, chronic pain syndrome with recurrent admissions to the hospital and recurrent admissions to the hospital presented to the hospital with fatigue, weakness, fever and shortness of breath not improved with inhalers at home.  In the ED, patient was febrile with a temperature 102.3, WBC count 16.5, blood pressures 90/58, lactic acid 3.  Chest x-ray with left lower lobe infiltrate.  Patient was then admitted to the hospital for sepsis secondary to pneumonia.  Patient had rapid response this on admission for wheezing.  PCCM was consulted.  Viral panel was positive for rhinovirus.  Patient did have slow recovery during hospitalization  Assessment & Plan:  Principal Problem:   Severe persistent asthma with acute exacerbation Active Problems:   Hx of Hodgkins lymphoma   Adrenal insufficiency (HCC)   Hypertension   Raynaud phenomenon   GERD (gastroesophageal reflux disease)   Hyperlipidemia   Chronic respiratory failure with hypoxia (HCC)   AKI (acute kidney injury) ([place])   Sepsis ([place])   Community acquired pneumonia   Anxiety   Sepsis secondary to left lower lobe pneumonia, immunocompromised patient.   Patient presented with hypoxemia lactic acidosis and leukocytosis.  Respiratory viral panel positive for rhinovirus.  COVID-19 negative.  Blood culture negative in> 5 days.  Sputum with gram-positive rods and Candida.  On Augmentin.  Total 10-day plan of antibiotic.  Was initially on Rocephin and Zithromax.  Continue chest PT therapy, incentive spirometry bronchodilators.  Pulmonary on board.  On Pulmicort nebulizer Candiss Norse, continue prednisone taper restarting her home dose of steroids.  Acute  kidney injury improved with IV fluids.  Started back on Bumex.  Continue to monitor BMP.  Latest creatinine of 1.2 from 1.1.  Hypokalemia: Improved after replacement.  Potassium of 4.4 today.  Chronic hypoxemic respiratory failure: On 4 L of oxygen at baseline.  We will continue on discharge.  Communicated with pulmonary regarding the patient.  Chronic diastolic heart failure, pulmonary hypertension: Continue beta-blockers, Bumex.    Adrenal insufficiency:  Currently on prednisone taper.  She will resume Solu-Cortef from her home doses 20/10 mg after completing prednisone taper..  Anxiety, depression, insomnia.  Multiple pain issues and nausea. ON  Klonopin, Wellbutrin, Marinol, Lexapro, Lamictal and Ambien.  On oral and IV Dilaudid.  Will taper as able.  Hyperglycemia associated with steroid use:  OnTradjenta, latest POC glucose of 160.  Shaking/tremors: No evidence of seizure disorder.  EEG was normal.  CT head was normal.   DVT prophylaxis: enoxaparin (LOVENOX) injection 40 mg Start: 07/01/22 2200  Code Status: DNR   Family Communication:  Spoke with the patient's husband and son at bedside on 07/07/2022  Disposition Plan: Home likely 07/09/2022  Status is: Inpatient  Remains inpatient appropriate because: Pending clinical improvement, persistent symptoms,  Consultants:  Infectious disease Pulmonary  Procedures:  None  Antimicrobials:  Cefepime and azithromycin 10/1--- 10/3 Augmentin 10/3---   Subjective:  Today, patient was seen and examined at bedside.  Complains of multiple symptoms including feeling of difficulty breathing in the morning with hypoxia when she sleeps.  Discussed about sleep apnea evaluation as outpatient.  Denies pain nausea vomiting but has some anxiety.  States that chest vest does help her some.  Objective: Vitals:   07/08/22 1000 07/08/22 1100 07/08/22 1200 07/08/22 1300  BP:      Pulse: 84 88 85 83  Resp: 19 16 (!) 22 12  Temp:       TempSrc:      SpO2: 97% 100% 98% 98%  Weight:      Height:       No intake or output data in the 24 hours ending 07/08/22 1432  Filed Weights   07/05/22 0500 07/06/22 0409 07/07/22 0500  Weight: 88.5 kg 88.6 kg 88.6 kg    Physical examination:  General:  Average built, not in obvious distress, on nasal cannula oxygen, mildly anxious HENT:   No scleral pallor or icterus noted. Oral mucosa is moist.  Chest: Diminished breath sounds bilaterally.  Coarse breath sounds heard, occasional wheezes. CVS: S1 &S2 heard. No murmur.  Regular rate and rhythm. Abdomen: Soft, nontender, nondistended.  Bowel sounds are heard.   Extremities: No cyanosis, clubbing or edema.  Peripheral pulses are palpable. Psych: Alert, awake and oriented, mildly anxious, CNS:  No cranial nerve deficits.  Power equal in all extremities.   Skin: Warm and dry.  No rashes noted.   Data Reviewed: I have personally reviewed following labs and imaging studies  CBC: Recent Labs  Lab 07/03/22 0529 07/06/22 0820  WBC 17.8* 10.7*  NEUTROABS 15.6* 7.5  HGB 8.7* 8.0*  HCT 29.4* 26.6*  MCV 82.8 83.1  PLT 328 062   Basic Metabolic Panel: Recent Labs  Lab 07/03/22 0529 07/06/22 0820 07/08/22 0308  NA 139 139 136  K 4.2 2.8* 4.4  CL 106 103 102  CO2 _0 GLUCOSE 160* 89 122*  BUN 30* 25* 27*  CREATININE 1.10* 1.13* 1.20*  CALCIUM 9.3 8.4* 9.4  MG 2.3 2.7* 2.3  PHOS 3.4  --   --    GFR: Estimated Creatinine Clearance: 60.9 mL/min (A) (by C-G formula based on SCr of 1.2 mg/dL (H)). Liver Function Tests: No results for input(s): "AST", "ALT", "ALKPHOS", "BILITOT", "PROT", "ALBUMIN" in the last 168 hours.  No results for input(s): "LIPASE", "AMYLASE" in the last 168 hours.  No results for input(s): "AMMONIA" in the last 168 hours. Coagulation Profile: No results for input(s): "INR", "PROTIME" in the last 168 hours. Cardiac Enzymes: No results for input(s): "CKTOTAL", "CKMB", "CKMBINDEX",  "TROPONINI" in the last 168 hours. BNP (last 3 results) No results for input(s): "PROBNP" in the last 8760 hours. HbA1C: No results for input(s): "HGBA1C" in the last 72 hours. CBG: Recent Labs  Lab 07/05/22 2019 07/05/22 2110 07/06/22 0446 07/06/22 0813 07/07/22 1927  GLUCAP 61* 117* 112* 82 160*   Lipid Profile: No results for input(s): "CHOL", "HDL", "LDLCALC", "TRIG", "CHOLHDL", "LDLDIRECT" in the last 72 hours. Thyroid Function Tests: No results for input(s): "TSH", "T4TOTAL", "FREET4", "T3FREE", "THYROIDAB" in the last 72 hours. Anemia Panel: No results for input(s): "VITAMINB12", "FOLATE", "FERRITIN", "TIBC", "IRON", "RETICCTPCT" in the last 72 hours. Sepsis Labs: Recent Labs  Lab 07/01/22 1512  LATICACIDVEN 3.0*    Recent Results (from the past 240 hour(s))  Culture, blood (routine x 2)     Status: None   Collection Time: 07/01/22 12:57 PM   Specimen: BLOOD  Result Value Ref Range Status   Specimen Description   Final    BLOOD RIGHT ANTECUBITAL Performed at [facility] [address] [place], [place] [address]    Special Requests   Final    BOTTLES DRAWN AEROBIC ONLY Blood  Culture adequate volume Performed at [facility] [address] [place], [place] [address]    Culture   Final    NO GROWTH 5 DAYS Performed at [facility], [place] [address] [place], [place] [address]    Report Status 07/06/2022 FINAL  Final  Culture, blood (routine x 2)     Status: None   Collection Time: 07/01/22 12:57 PM   Specimen: BLOOD  Result Value Ref Range Status   Specimen Description   Final    BLOOD LEFT ANTECUBITAL Performed at [facility] [address] [place], [place] [address]    Special Requests   Final    BOTTLES DRAWN AEROBIC AND ANAEROBIC Blood Culture adequate volume Performed at [facility] [address], [place], [place] [address]    Culture   Final    NO GROWTH 5  DAYS Performed at [facility], [place] [address] [place], [place] [address]    Report Status 07/06/2022 FINAL  Final  Resp Panel by RT-PCR (Flu A&B, Covid) Peripheral     Status: None   Collection Time: 07/01/22  1:39 PM   Specimen: Peripheral; Nasal Swab  Result Value Ref Range Status   SARS Coronavirus 2 by RT PCR NEGATIVE NEGATIVE Final    Comment: (NOTE) SARS-CoV-2 target nucleic acids are NOT DETECTED.  The SARS-CoV-2 RNA is generally detectable in upper respiratory specimens during the acute phase of infection. The lowest concentration of SARS-CoV-2 viral copies this assay can detect is 138 copies/mL. A negative result does not preclude SARS-Cov-2 infection and should not be used as the sole basis for treatment or other patient management decisions. A negative result may occur with  improper specimen collection/handling, submission of specimen other than nasopharyngeal swab, presence of viral mutation(s) within the areas targeted by this assay, and inadequate number of viral copies(<138 copies/mL). A negative result must be combined with clinical observations, patient history, and epidemiological information. The expected result is Negative.  Fact Sheet for Patients:  EntrepreneurPulse.com.au  Fact Sheet for Healthcare Providers:  IncredibleEmployment.be  This test is no t yet approved or cleared by the [place] FDA and  has been authorized for detection and/or diagnosis of SARS-CoV-2 by FDA under an Emergency Use Authorization (EUA). This EUA will remain  in effect (meaning this test can be used) for the duration of the COVID-19 declaration under Section 564(b)(1) of the Act, 21 U.S.C.section 360bbb-3(b)(1), unless the authorization is terminated  or revoked sooner.       Influenza A by PCR NEGATIVE NEGATIVE Final   Influenza B by PCR NEGATIVE NEGATIVE Final    Comment: (NOTE) The Xpert Xpress SARS-CoV-2/FLU/RSV plus  assay is intended as an aid in the diagnosis of influenza from Nasopharyngeal swab specimens and should not be used as a sole basis for treatment. Nasal washings and aspirates are unacceptable for Xpert Xpress SARS-CoV-2/FLU/RSV testing.  Fact Sheet for Patients: EntrepreneurPulse.com.au  Fact Sheet for Healthcare Providers: IncredibleEmployment.be  This test is not yet approved or cleared by the [place] FDA and has been authorized for detection and/or diagnosis of SARS-CoV-2 by FDA under an Emergency Use Authorization (EUA). This EUA will remain in effect (meaning this test can be used) for the duration of the COVID-19 declaration under Section 564(b)(1) of the Act, 21 U.S.C. section 360bbb-3(b)(1), unless the authorization is terminated or revoked.  Performed at [facility], [place] [address] [place], [place] [address]   Expectorated Sputum  Assessment w Gram Stain, Rflx to Resp Cult     Status: None   Collection Time: 07/03/22  6:36 AM   Specimen: Expectorated Sputum  Result Value Ref Range Status   Specimen Description EXPECTORATED SPUTUM  Final   Special Requests NONE  Final   Sputum evaluation   Final    THIS SPECIMEN IS ACCEPTABLE FOR SPUTUM CULTURE Performed at [facility], [place] [address] [place], [place] [address]    Report Status 07/03/2022 FINAL  Final  Culture, Respiratory w Gram Stain     Status: None   Collection Time: 07/03/22  6:36 AM  Result Value Ref Range Status   Specimen Description   Final    EXPECTORATED SPUTUM Performed at [facility], [place] [address], [place], [place] [address]    Special Requests   Final    NONE Reflexed from [phone] Performed at [facility], [place] [address], [place], [place] [address]    Gram Stain   Final    FEW WBC PRESENT,BOTH PMN AND MONONUCLEAR FEW BUDDING YEAST SEEN RARE GRAM POSITIVE RODS Performed at  [facility], [place] [address] [place], [place] [address]    Culture FEW CANDIDA ALBICANS  Final   Report Status 07/06/2022 FINAL  Final  Respiratory (~20 pathogens) panel by PCR     Status: Abnormal   Collection Time: 07/03/22  6:55 PM   Specimen: Nasopharyngeal Swab; Respiratory  Result Value Ref Range Status   Adenovirus NOT DETECTED NOT DETECTED Final   Coronavirus 229E NOT DETECTED NOT DETECTED Final    Comment: (NOTE) The Coronavirus on the Respiratory Panel, DOES NOT test for the novel  Coronavirus (2019 nCoV)    Coronavirus HKU1 NOT DETECTED NOT DETECTED Final   Coronavirus NL63 NOT DETECTED NOT DETECTED Final   Coronavirus OC43 NOT DETECTED NOT DETECTED Final   Metapneumovirus NOT DETECTED NOT DETECTED Final   Rhinovirus / Enterovirus DETECTED (A) NOT DETECTED Final   Influenza A NOT DETECTED NOT DETECTED Final   Influenza B NOT DETECTED NOT DETECTED Final   Parainfluenza Virus 1 NOT DETECTED NOT DETECTED Final   Parainfluenza Virus 2 NOT DETECTED NOT DETECTED Final   Parainfluenza Virus 3 NOT DETECTED NOT DETECTED Final   Parainfluenza Virus 4 NOT DETECTED NOT DETECTED Final   Respiratory Syncytial Virus NOT DETECTED NOT DETECTED Final   Bordetella pertussis NOT DETECTED NOT DETECTED Final   Bordetella Parapertussis NOT DETECTED NOT DETECTED Final   Chlamydophila pneumoniae NOT DETECTED NOT DETECTED Final   Mycoplasma pneumoniae NOT DETECTED NOT DETECTED Final    Comment: Performed at [facility], [place] [address], [place], [place] [address]  MRSA Next Gen by PCR, Nasal     Status: None   Collection Time: 07/03/22  6:58 PM   Specimen: Nasal Mucosa; Nasal Swab  Result Value Ref Range Status   MRSA by PCR Next Gen NOT DETECTED NOT DETECTED Final    Comment: (NOTE) The GeneXpert MRSA Assay (FDA approved for NASAL specimens only), is one component of a comprehensive MRSA colonization surveillance program. It is not intended to diagnose MRSA infection nor to  guide or monitor treatment for MRSA infections. Test performance is not FDA approved in patients less than 48 years old. Performed at [facility], [place] [address], [place], [place] [address]      Radiology Studies: No results found.   Scheduled Meds:  amoxicillin-clavulanate  875 mg Oral Q12H   arformoterol  15 mcg Nebulization BID   budesonide (PULMICORT) nebulizer solution  0.5 mg Nebulization BID   bumetanide  1 mg Oral BID   buPROPion  150 mg Oral Daily   Chlorhexidine Gluconate Cloth  6 each Topical Daily   enoxaparin (LOVENOX) injection  40 mg Subcutaneous Q24H   escitalopram  20 mg Oral QPM   famotidine  20 mg Oral QHS   fluconazole  150 mg Oral Once   guaiFENesin  15 mL Oral QID   lamoTRIgine  150 mg Oral QHS   levothyroxine  100 mcg Oral q morning   linagliptin  5 mg Oral Daily   montelukast  10 mg Oral QHS   pantoprazole  40 mg Oral Daily   predniSONE  30 mg Oral Q breakfast   Followed by   Derrill Memo ON 07/12/2022] predniSONE  20 mg Oral Q breakfast   Followed by   Derrill Memo ON 07/16/2022] predniSONE  10 mg Oral Q breakfast   Followed by   Derrill Memo ON 07/20/2022] predniSONE  5 mg Oral Q breakfast   revefenacin  175 mcg Nebulization Daily   saccharomyces boulardii  250 mg Oral BID   topiramate  150 mg Oral QHS   Continuous Infusions:     LOS: 7 days    Flora Lipps, MD [facility]

## 2022-07-08 NOTE — Progress Notes (Signed)
Pt wants to hold off on Chest Vest for tonight.

## 2022-07-08 NOTE — Progress Notes (Signed)
NAME:  [name], MRN:  [id number], DOB:  04-17-1971, LOS: 60 ADMISSION DATE:  07/01/2022, CONSULTATION DATE:  07/03/22 REFERRING MD:  Barb Merino, MD CHIEF COMPLAINT:  Bronchospasms   History of Present Illness:  [name] is a 51 year old woman with history of hodgkin's lymphoma, breast cancer, cervical cancer, thyroid cancer, hypothyroidism, addison's disease, GERD, asthma, chronic hypoxemic respiratory failure, HFpEF, hypertension, anxiety/depression, and chronic pain syndrome who was admitted for sepsis due to pneumonia on 07/01/22. PCCM is consulted for ongoing bronchospasms.   She has dyspnea, wheezing, intermittent sensation of her throat narrowing. She had ENT evaluation last year and flexible laryngoscopy was unremarkable. She reports her symptoms are worse than her usual asthma exacerbation. She reports fevers earlier on during the onset of these symptoms.  Respiratory culture is showing few budding yeast and rare gram positive rods. Influenza and covid negative on admission. CXR on admission showed left lower lobe opacity. She was started on cefepime and azithromycin, changed cefepime to ceftriaxone and now taking augmentin.   She was last seen in pulmonary clinic 02/02/22 with transition to Advair 230-14mg 2 puffs twice daily. She reports being on nucala in the past with improvement of her breathing.  Pertinent  Medical History   Past Medical History:  Diagnosis Date   Addison's disease ([facility]    Adrenal insufficiency ([facility]    Anemia    Anxiety    Aortic stenosis    Aortic stenosis    Appendicitis 12/19/09   Appendicitis    Breast cancer (HCC)    STATUS POST BILATERAL MASTECTOMY. STATUS POST RECONSTRUCTION. SHE HAD SILICONE BREAST IMPLANTS AND THE LEFT IMPLANT IS LEAKING SLIGHTLY   Cellulitis of right middle finger 11/07/2018   Cervical cancer ([place] 12/23/2018   Chest pain    CHF with right heart failure ([facility] [place] 04/17/2017   Chronic respiratory failure with hypoxia (HCC)  12/23/2018   Cough variant asthma 04/13/2019   Depression    GERD (gastroesophageal reflux disease)    takes Dexilant and carafate and gi coctail    Headache    migraines on a daily and monthly regimen    Heart murmur    History of kidney stones    Hodgkin lymphoma ([facility]    STATUS POST MANTLE RADIATION   Hodgkin's lymphoma ([place]    1987   Hypertension    Hypoxia    Necrotizing fasciitis ([facility] 12/23/2018   Non-ischemic cardiomyopathy ([place]    Osteoporosis    Palpitations    Pituitary adenoma ([facility] 12/23/2018   Pneumonia    PONV (postoperative nausea and vomiting)    Pre-diabetes    per pt; no meds   Pulmonary hypertension ([facility] 12/23/2018   Raynaud phenomenon    Right heart failure ([place] 04/17/2017   Seizures ([place]    last febrile seizure was approx 3 weeks ago per report on 12/01/2020   Sleep apnea    upcoming sleep study per pt    Supplemental oxygen dependent    3 liters   SVT (supraventricular tachycardia)    Tachycardia    Thyroid cancer ([facility]    STATUS POST SURGICAL REMOVAL-CURRENT ON THYROID REPLACEMENT    Significant Hospital Events: Including procedures, antibiotic start and stop dates in addition to other pertinent events   10/1 admitted 10/3 PCCM consulted for recurrent bronchospasms 10/4 transferred to step down unit for rapid response  Interim History / Subjective:   No acute issues. Frustrated with blown IVs.  Objective   Blood pressure 136/62, pulse  100, temperature 98.2 F (36.8 C), temperature source Oral, resp. rate (!) 25, height _0  (1.651 m), weight 88.6 kg, SpO2 100 %.       No intake or output data in the 24 hours ending 07/08/22 0902  Filed Weights   07/05/22 0500 07/06/22 0409 07/07/22 0500  Weight: 88.5 kg 88.6 kg 88.6 kg    Examination: General appearance: 51 y.o., female, NAD, conversant  Eyes: tracking appropriately HENT: NCAT; MMM Lungs: upper>lower airway wheeze, with normal respiratory effort CV: RRR, no murmur  Abdomen:  Soft, non-tender; non-distended, BS present  Extremities: No peripheral edema, warm Skin: Normal turgor and texture; no rash Neuro: Attentive, no focal deficit     Labs/imaging reviewed: BMP stable  No new imaging  Resolved Hospital Problem list     Assessment & Plan:  Viral Pneumonia/Bronchitis, rhinovirus+ Severe Persistent Asthma with Exacerbation Chronic Hypoxemic Respiratory Failure Chronic bronchitis Adrenal Insufficiency Anxiety/Depression GERD 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 She has upper airway wheeze concerning for questionable upper airway inflammation vs vocal cord dysfunction. She has significant anxiety component to her presentation as well. Although previous bronchoscopy not notable for excessive dynamic airway compression/tracheobronchomalacia her HRCT in 2021 was suggestive of EDAC which could be contributing to her upper airway wheeze.  Plan: - continue oral prednisone taper, can add on her home hydrocort towards end of taper - continue budesonide + brovana twice daily and yupelri daily, resume advair hfa 230 2 puffs twice daily at discharge, and would start incruse at discharge - scheduled guaifenesin - continue vest q4 while awake per pt request - we'll see if we can get this at home for her - message sent to CM, consult placed to TOC - PRN duonebs q4hrs - PRN lidocaine nebulizer treatment for cough - She is on pantoprazole 74m daily, continue 224mfamotidine at bedtime  - Recommend eating at least 2 hours before bed, sleep with head of bed 30 degrees or higher. Recommend reducing her soda intake. - Augmentin per ID - will plan to pursue home sleep study as outpatient, order placed  PCCM will continue to follow   Best Practice (right click and "Reselect all SmartList Selections" daily)   Per primary team  Critical care  time: n/[place] personal pager PCCM on call pager ([phone] 7pm. Please call Elink 7p-7a. [phone]

## 2022-07-09 DIAGNOSIS — I10 Essential (primary) hypertension: Secondary | ICD-10-CM

## 2022-07-09 MED ORDER — HYDROCORTISONE 10 MG PO TABS: 10.0000 mg | ORAL_TABLET | ORAL | Status: DC

## 2022-07-09 MED ORDER — OXYCODONE-ACETAMINOPHEN 5-325 MG PO TABS: 1.0000 | ORAL_TABLET | Freq: Four times a day (QID) | ORAL | 0 refills | Status: AC | PRN

## 2022-07-09 MED ORDER — MONTELUKAST SODIUM 10 MG PO TABS: 10.0000 mg | ORAL_TABLET | Freq: Every day | ORAL | 2 refills | Status: DC

## 2022-07-09 MED ORDER — AMOXICILLIN-POT CLAVULANATE 875-125 MG PO TABS: 1.0000 | ORAL_TABLET | Freq: Two times a day (BID) | ORAL | 0 refills | Status: AC

## 2022-07-09 MED ORDER — GUAIFENESIN 100 MG/5ML PO LIQD: 15.0000 mL | Freq: Four times a day (QID) | ORAL | 0 refills | Status: DC | PRN

## 2022-07-09 MED ORDER — FAMOTIDINE 20 MG PO TABS: 20.0000 mg | ORAL_TABLET | Freq: Every day | ORAL | 2 refills | Status: DC

## 2022-07-09 MED ORDER — MAGIC MOUTHWASH W/LIDOCAINE: 5.0000 mL | Freq: Four times a day (QID) | ORAL | 0 refills | Status: AC | PRN

## 2022-07-09 MED ORDER — PREDNISONE 10 MG PO TABS: ORAL_TABLET | ORAL | 0 refills | Status: DC

## 2022-07-09 MED ORDER — INCRUSE ELLIPTA 62.5 MCG/ACT IN AEPB: 1.0000 | INHALATION_SPRAY | Freq: Every day | RESPIRATORY_TRACT | 2 refills | Status: DC

## 2022-07-09 MED ORDER — ALBUTEROL SULFATE (2.5 MG/3ML) 0.083% IN NEBU: 2.5000 mg | INHALATION_SOLUTION | Freq: Four times a day (QID) | RESPIRATORY_TRACT | 2 refills | Status: DC | PRN

## 2022-07-09 NOTE — Discharge Summary (Signed)
Physician Discharge Summary  [name] IFO:[id number] DOB: [birth year] DOA: 07/01/2022  PCP: Su Grand, MD  Admit date: 07/01/2022 Discharge date: 07/09/2022  Admitted From: Home  Discharge disposition: Home   Recommendations for Outpatient Follow-Up:   Follow up with your primary care provider in one week.  Check CBC, BMP, magnesium in the next visit Recommended follow-up with pulmonary for outpatient sleep study, GI for swallowing issues.   Discharge Diagnosis:   Principal Problem:   Severe persistent asthma with acute exacerbation Active Problems:   Hx of Hodgkins lymphoma   Adrenal insufficiency (HCC)   Hypertension   Raynaud phenomenon   GERD (gastroesophageal reflux disease)   Hyperlipidemia   Chronic respiratory failure with hypoxia (HCC)   AKI (acute kidney injury) ([place])   Sepsis ([place])   Community acquired pneumonia   Anxiety   Discharge Condition: Improved.  Diet recommendation:  Regular.  Wound care: None.  Code status: Full.   History of Present Illness:   51 year old female with past medical history of multiple cancers with chronic hypoxemia on 4 L of oxygen by nasal cannula, history of Addison's disease, GERD, hypothyroidism, chronic pain syndrome with recurrent admissions to the hospital and recurrent admissions to the hospital presented to the hospital with fatigue, weakness, fever and shortness of breath not improved with inhalers at home.  In the ED, patient was febrile with a temperature 102.3, WBC count 16.5, blood pressures 90/58, lactic acid 3.  Chest x-ray with left lower lobe infiltrate.  Patient was then admitted to the hospital for sepsis secondary to pneumonia.  Patient had rapid response this on admission for wheezing.  PCCM was consulted.  Viral panel was positive for rhinovirus.  Patient did have slow recovery during hospitalization   Hospital Course:   Following conditions were addressed during hospitalization as listed  below,  Sepsis secondary to left lower lobe pneumonia, immunocompromised patient.   Patient presented with hypoxemia lactic acidosis and leukocytosis.  Respiratory viral panel was positive for rhinovirus.  COVID-19 negative.  Blood culture negative in> 5 days.  Sputum with gram-positive rods and Candida.  Patient was initiated on Augmentin and plan was to continue 10-day course.  Patient will be given 3 more days of Augmentin on discharge to complete the course.  Was initially on Rocephin and Zithromax.  Patient was encouraged to bronchodilators/incentive spirometry on discharge.  Pulmonary followed the patient during hospitalization.  We will also add Incruse on discharge.   Acute kidney injury improved with IV fluids.  Started back on Bumex.  Continue to monitor BMP.  Latest creatinine of 1.2   Hypokalemia: Improved after replacement.   Chronic hypoxemic respiratory failure: On 4 L of oxygen at baseline.  We will continue on discharge.  We will follow-up with pulmonary as outpatient.  Chronic diastolic heart failure, pulmonary hypertension:  Continue beta-blockers, Bumex.     Adrenal insufficiency:  We will continue prednisone taper on discharge.  She will resume Solu-Cortef from her home doses 20/10 mg after completing prednisone taper..   Anxiety, depression, insomnia.  Multiple pain issues and nausea. ON  Klonopin, Wellbutrin, Marinol, Lexapro, Lamictal and Ambien.     Hyperglycemia associated with steroid use:  OnTradjenta   Shaking/tremors: No evidence of seizure disorder.  EEG was normal.  CT head was normal.   Disposition.  At this time, patient is stable for disposition home with outpatient PCP and pulmonary follow-up  Medical Consultants:   Infectious disease Pulmonary  Procedures:    None Subjective:  Today, patient was seen and examined at bedside.  Has some anxiety.  Okay about going home today.  Discharge Exam:   Vitals:   07/09/22 0535 07/09/22 0720  BP:  135/65   Pulse: 87   Resp: (!) 23   Temp:  97.9 F (36.6 C)  SpO2: 100%    Vitals:   07/09/22 0400 07/09/22 0532 07/09/22 0535 07/09/22 0720  BP:  135/65 135/65   Pulse: 83 86 87   Resp: 11 (!) 21 (!) 23   Temp:  98.1 F (36.7 C)  97.9 F (36.6 C)  TempSrc:  Oral  Oral  SpO2: 99% 100% 100%   Weight:      Height:        General: Alert awake, not in obvious distress, on nasal cannula oxygen, mildly anxious, HENT: pupils equally reacting to light,  No scleral pallor or icterus noted. Oral mucosa is moist.  Chest:   Diminished breath sounds bilaterally.  Coarse breath sounds noted. CVS: S1 &S2 heard. No murmur.  Regular rate and rhythm. Abdomen: Soft, nontender, nondistended.  Bowel sounds are heard.   Extremities: No cyanosis, clubbing or edema.  Peripheral pulses are palpable. Psych: Alert, awake and oriented, mildly anxious. CNS:  No cranial nerve deficits.  Power equal in all extremities.   Skin: Warm and dry.  No rashes noted.  The results of significant diagnostics from this hospitalization (including imaging, microbiology, ancillary and laboratory) are listed below for reference.     Diagnostic Studies:   EEG adult  Result Date: 07/05/2022 Lora Havens, MD     07/05/2022  8:49 AM Patient Name: [name] MRN: [id number] Epilepsy Attending: Lora Havens Referring Physician/Provider: Barb Merino, MD Date: 07/04/2022 Duration: 24.40 mins Patient history: 51yo F noted to have shaking and tremors with episodes of unresponsiveness today morning, on examination at bedside breathing normal, 100% on 3 L oxygen, patient is with flickering eyes, she will withdraw to pain, tone normal.  No evidence of tonic-clonic seizure. EEG to evaluate for seizure. Level of alertness: Awake AEDs during EEG study:  TPM, LTG Technical aspects: This EEG study was done with scalp electrodes positioned according to the 10-20 International system of electrode placement. Electrical activity was  reviewed with band pass filter of 1-_0 , sensitivity of 7 uV/mm, display speed of 27m/sec with a _1  notched filter applied as appropriate. EEG data were recorded continuously and digitally stored.  Video monitoring was available and reviewed as appropriate. Description: The posterior dominant rhythm consists of 9-10 Hz activity of moderate voltage (25-35 uV) seen predominantly in posterior head regions, symmetric and reactive to eye opening and eye closing. Physiologic photic driving was seen during photic stimulation.  Hyperventilation was not performed.   Of note, parts of study were technically difficult due to significant movement artifact. IMPRESSION: This study is within normal limits. No seizures or epileptiform discharges were seen throughout the recording. A normal interictal EEG does not exclude the diagnosis of epilepsy. Priyanka OBarbra Sarks  CT HEAD WO CONTRAST (5MM)  Result Date: 07/04/2022 CLINICAL DATA:  Mental status changes EXAM: CT HEAD WITHOUT CONTRAST TECHNIQUE: Contiguous axial images were obtained from the base of the skull through the vertex without intravenous contrast. RADIATION DOSE REDUCTION: This exam was performed according to the departmental dose-optimization program which includes automated exposure control, adjustment of the mA and/or kV according to patient size and/or use of iterative reconstruction technique. COMPARISON:  02/10/2020 FINDINGS: Brain: No evidence of acute infarction, hemorrhage, hydrocephalus,  extra-axial collection or mass lesion/mass effect. Vascular: No hyperdense vessel or unexpected calcification. Skull: No osseous abnormality. Sinuses/Orbits: Visualized paranasal sinuses are clear. Visualized mastoid sinuses are clear. Visualized orbits demonstrate no focal abnormality. Other: None IMPRESSION: No acute intracranial findings. Electronically Signed   By: Kathreen Devoid M.D.   On: 07/04/2022 15:23     Labs:   Basic Metabolic Panel: Recent Labs  Lab  07/03/22 0529 07/06/22 0820 07/08/22 0308  NA 139 139 136  K 4.2 2.8* 4.4  CL 106 103 102  CO2 _0 GLUCOSE 160* 89 122*  BUN 30* 25* 27*  CREATININE 1.10* 1.13* 1.20*  CALCIUM 9.3 8.4* 9.4  MG 2.3 2.7* 2.3  PHOS 3.4  --   --    GFR Estimated Creatinine Clearance: 60.9 mL/min (A) (by C-G formula based on SCr of 1.2 mg/dL (H)). Liver Function Tests: No results for input(s): "AST", "ALT", "ALKPHOS", "BILITOT", "PROT", "ALBUMIN" in the last 168 hours. No results for input(s): "LIPASE", "AMYLASE" in the last 168 hours. No results for input(s): "AMMONIA" in the last 168 hours. Coagulation profile No results for input(s): "INR", "PROTIME" in the last 168 hours.  CBC: Recent Labs  Lab 07/03/22 0529 07/06/22 0820  WBC 17.8* 10.7*  NEUTROABS 15.6* 7.5  HGB 8.7* 8.0*  HCT 29.4* 26.6*  MCV 82.8 83.1  PLT 328 310   Cardiac Enzymes: No results for input(s): "CKTOTAL", "CKMB", "CKMBINDEX", "TROPONINI" in the last 168 hours. BNP: Invalid input(s): "POCBNP" CBG: Recent Labs  Lab 07/05/22 2019 07/05/22 2110 07/06/22 0446 07/06/22 0813 07/07/22 1927  GLUCAP 61* 117* 112* 82 160*   D-Dimer No results for input(s): "DDIMER" in the last 72 hours. Hgb A1c No results for input(s): "HGBA1C" in the last 72 hours. Lipid Profile No results for input(s): "CHOL", "HDL", "LDLCALC", "TRIG", "CHOLHDL", "LDLDIRECT" in the last 72 hours. Thyroid function studies No results for input(s): "TSH", "T4TOTAL", "T3FREE", "THYROIDAB" in the last 72 hours.  Invalid input(s): "FREET3" Anemia work up No results for input(s): "VITAMINB12", "FOLATE", "FERRITIN", "TIBC", "IRON", "RETICCTPCT" in the last 72 hours. Microbiology Recent Results (from the past 240 hour(s))  Culture, blood (routine x 2)     Status: None   Collection Time: 07/01/22 12:57 PM   Specimen: BLOOD  Result Value Ref Range Status   Specimen Description   Final    BLOOD RIGHT ANTECUBITAL Performed at [facility] [address], [place], [place] [address]    Special Requests   Final    BOTTLES DRAWN AEROBIC ONLY Blood Culture adequate volume Performed at [facility] [address] [place], [place] [address]    Culture   Final    NO GROWTH 5 DAYS Performed at [facility], [place] [address] [place], [place] [address]    Report Status 07/06/2022 FINAL  Final  Culture, blood (routine x 2)     Status: None   Collection Time: 07/01/22 12:57 PM   Specimen: BLOOD  Result Value Ref Range Status   Specimen Description   Final    BLOOD LEFT ANTECUBITAL Performed at [facility] [address] [place], [place] [address]    Special Requests   Final    BOTTLES DRAWN AEROBIC AND ANAEROBIC Blood Culture adequate volume Performed at [facility] [address], [place], [place] [address]    Culture   Final    NO GROWTH 5 DAYS Performed at [facility], [place]  [address] [place], [place] [address]    Report Status 07/06/2022 FINAL  Final  Resp Panel by RT-PCR (Flu A&B, Covid) Peripheral     Status: None   Collection Time: 07/01/22  1:39 PM   Specimen: Peripheral; Nasal Swab  Result Value Ref Range Status   SARS Coronavirus 2 by RT PCR NEGATIVE NEGATIVE Final    Comment: (NOTE) SARS-CoV-2 target nucleic acids are NOT DETECTED.  The SARS-CoV-2 RNA is generally detectable in upper respiratory specimens during the acute phase of infection. The lowest concentration of SARS-CoV-2 viral copies this assay can detect is 138 copies/mL. A negative result does not preclude SARS-Cov-2 infection and should not be used as the sole basis for treatment or other patient management decisions. A negative result may occur with  improper specimen collection/handling, submission of specimen other than nasopharyngeal swab, presence of viral mutation(s) within the areas targeted by this assay, and inadequate number of viral copies(<138  copies/mL). A negative result must be combined with clinical observations, patient history, and epidemiological information. The expected result is Negative.  Fact Sheet for Patients:  EntrepreneurPulse.com.au  Fact Sheet for Healthcare Providers:  IncredibleEmployment.be  This test is no t yet approved or cleared by the [place] FDA and  has been authorized for detection and/or diagnosis of SARS-CoV-2 by FDA under an Emergency Use Authorization (EUA). This EUA will remain  in effect (meaning this test can be used) for the duration of the COVID-19 declaration under Section 564(b)(1) of the Act, 21 U.S.C.section 360bbb-3(b)(1), unless the authorization is terminated  or revoked sooner.       Influenza A by PCR NEGATIVE NEGATIVE Final   Influenza B by PCR NEGATIVE NEGATIVE Final    Comment: (NOTE) The Xpert Xpress SARS-CoV-2/FLU/RSV plus assay is intended as an aid in the diagnosis of influenza from Nasopharyngeal swab specimens and should not be used as a sole basis for treatment. Nasal washings and aspirates are unacceptable for Xpert Xpress SARS-CoV-2/FLU/RSV testing.  Fact Sheet for Patients: EntrepreneurPulse.com.au  Fact Sheet for Healthcare Providers: IncredibleEmployment.be  This test is not yet approved or cleared by the [place] FDA and has been authorized for detection and/or diagnosis of SARS-CoV-2 by FDA under an Emergency Use Authorization (EUA). This EUA will remain in effect (meaning this test can be used) for the duration of the COVID-19 declaration under Section 564(b)(1) of the Act, 21 U.S.C. section 360bbb-3(b)(1), unless the authorization is terminated or revoked.  Performed at [facility], [place] [address] [place], [place] [address]   Expectorated Sputum Assessment w Gram Stain, Rflx to Resp Cult     Status: None   Collection Time: 07/03/22  6:36 AM    Specimen: Expectorated Sputum  Result Value Ref Range Status   Specimen Description EXPECTORATED SPUTUM  Final   Special Requests NONE  Final   Sputum evaluation   Final    THIS SPECIMEN IS ACCEPTABLE FOR SPUTUM CULTURE Performed at [facility], [place] [address], [place], [place] [address]    Report Status 07/03/2022 FINAL  Final  Culture, Respiratory w Gram Stain     Status: None   Collection Time: 07/03/22  6:36 AM  Result Value Ref Range Status   Specimen Description   Final    EXPECTORATED SPUTUM Performed at [facility], [place] [address], [place], [place] [address]    Special Requests   Final    NONE Reflexed from [id number] Performed at [facility] [place] [address] [place], [place] [address]    Gram Stain   Final    FEW WBC PRESENT,BOTH PMN AND MONONUCLEAR FEW BUDDING YEAST SEEN RARE GRAM POSITIVE RODS Performed at [facility], [place] [address] [place], [place] [address]    Culture FEW CANDIDA ALBICANS  Final   Report Status 07/06/2022 FINAL  Final  Respiratory (~20 pathogens) panel by PCR     Status: Abnormal   Collection Time: 07/03/22  6:55 PM   Specimen: Nasopharyngeal Swab; Respiratory  Result Value Ref Range Status   Adenovirus NOT DETECTED NOT DETECTED Final   Coronavirus 229E NOT DETECTED NOT DETECTED Final    Comment: (NOTE) The Coronavirus on the Respiratory Panel, DOES NOT test for the novel  Coronavirus (2019 nCoV)    Coronavirus HKU1 NOT DETECTED NOT DETECTED Final   Coronavirus NL63 NOT DETECTED NOT DETECTED Final   Coronavirus OC43 NOT DETECTED NOT DETECTED Final   Metapneumovirus NOT DETECTED NOT DETECTED Final   Rhinovirus / Enterovirus DETECTED (A) NOT DETECTED Final   Influenza A NOT DETECTED NOT DETECTED Final   Influenza B NOT DETECTED NOT DETECTED Final   Parainfluenza Virus 1 NOT DETECTED NOT DETECTED Final   Parainfluenza Virus 2 NOT DETECTED NOT DETECTED Final   Parainfluenza Virus  3 NOT DETECTED NOT DETECTED Final   Parainfluenza Virus 4 NOT DETECTED NOT DETECTED Final   Respiratory Syncytial Virus NOT DETECTED NOT DETECTED Final   Bordetella pertussis NOT DETECTED NOT DETECTED Final   Bordetella Parapertussis NOT DETECTED NOT DETECTED Final   Chlamydophila pneumoniae NOT DETECTED NOT DETECTED Final   Mycoplasma pneumoniae NOT DETECTED NOT DETECTED Final    Comment: Performed at [facility], [place]. [address], [place], [place] [address]  MRSA Next Gen by PCR, Nasal     Status: None   Collection Time: 07/03/22  6:58 PM   Specimen: Nasal Mucosa; Nasal Swab  Result Value Ref Range Status   MRSA by PCR Next Gen NOT DETECTED NOT DETECTED Final    Comment: (NOTE) The GeneXpert MRSA Assay (FDA approved for NASAL specimens only), is one component of a comprehensive MRSA colonization surveillance program. It is not intended to diagnose MRSA infection nor to guide or monitor treatment for MRSA infections. Test performance is not FDA approved in patients less than 69 years old. Performed at [facility], [place] [address], [place], [place] [address]      Discharge Instructions:   Discharge Instructions     Diet - low sodium heart healthy   Complete by: As directed    Discharge instructions   Complete by: As directed    Follow-up with your primary care provider in 1 week.  Check blood work at that time.  Take medications as prescribed.  Start your home steroid medicine after completing prednisone taper. Discuss about getting sleep study as outpatient.  Follow up with your pulmonary physician, GI as outpatient. Seek medical attention for worsening symptoms.   Increase activity slowly   Complete by: As directed    No wound care   Complete by: As directed       Allergies as of 07/09/2022       Reactions   Ferrous Bisglycinate Chelate [iron] Anaphylaxis, Other (See Comments)   Only IV - only FERRLICET   Mushroom Extract Complex Anaphylaxis    Na Ferric Gluc Cplx In Sucrose Anaphylaxis   Cymbalta [duloxetine Hcl] Swelling, Anxiety   Ondansetron Hcl Other (See Comments)   Promethazine Other (See Comments)  Other reaction(s): Unknown   Promethazine Hcl    Other reaction(s): Unknown   Succinylcholine Other (See Comments)   Lock Jaw   Buprenorphine Hcl Hives   Compazine Other (See Comments)   Altered mental status Aggression   Metoclopramide Other (See Comments)   Dystonia   Morphine And Related Hives   Ondansetron Hives, Rash   Other reaction(s): Unknown Other reaction(s): Unknown   Promethazine Hcl Hives   Tegaderm Ag Mesh [silver] Rash   Old formulation only, is able to tolerate new formulation        Medication List     STOP taking these medications    fluconazole 150 MG tablet Commonly known as: DIFLUCAN   heparin lock flush 100 UNIT/ML Soln injection   metoprolol succinate 25 MG 24 hr tablet Commonly known as: TOPROL-XL   potassium chloride 10 MEQ tablet Commonly known as: KLOR-CON       TAKE these medications    amoxicillin-clavulanate 875-125 MG tablet Commonly known as: AUGMENTIN Take 1 tablet by mouth 2 (two) times daily for 3 days.   aspirin 325 MG tablet Take 325 mg by mouth at bedtime.   bumetanide 1 MG tablet Commonly known as: BUMEX TAKE 1 TABLET BY MOUTH TWICE A DAY   buPROPion 150 MG 24 hr tablet Commonly known as: WELLBUTRIN XL TAKE 1 TABLET BY MOUTH EVERY DAY What changed:  when to take this additional instructions   Cambia 50 MG Pack Generic drug: Diclofenac Potassium(Migraine) Take 50 mg by mouth daily as needed for migraine.   clonazePAM 0.5 MG tablet Commonly known as: KlonoPIN Take 1 tablet (0.5 mg total) by mouth 2 (two) times daily as needed for anxiety. What changed:  when to take this additional instructions   dexlansoprazole 60 MG capsule Commonly known as: DEXILANT Take 60 mg by mouth daily.   diphenhydrAMINE 50 MG/ML injection Commonly known as:  BENADRYL Inject 25 mg into the muscle once as needed (for nausea).   dronabinol 2.5 MG capsule Commonly known as: MARINOL Take 1 capsule (2.5 mg total) by mouth 2 (two) times daily as needed (nausea). What changed: when to take this   Emgality 120 MG/ML Sosy Generic drug: Galcanezumab-gnlm Inject 120 mg into the skin every 28 (twenty-eight) days.   EPINEPHrine 0.3 mg/0.3 mL Soaj injection Commonly known as: EPI-PEN Inject 0.3 mg into the muscle as needed for anaphylaxis.   escitalopram 20 MG tablet Commonly known as: LEXAPRO Take 1 tablet (20 mg total) by mouth every evening. What changed:  when to take this additional instructions   famotidine 20 MG tablet Commonly known as: PEPCID Take 1 tablet (20 mg total) by mouth at bedtime.   Florastor 250 MG capsule Generic drug: saccharomyces boulardii Take 250 mg by mouth See admin instructions. Take 250 mg by mouth mid-morning and mid-afternoon   fluticasone-salmeterol 230-21 MCG/ACT inhaler Commonly known as: Advair HFA Inhale 2 puffs into the lungs 2 (two) times daily. What changed: when to take this   guaiFENesin 100 MG/5ML liquid Commonly known as: ROBITUSSIN Take 15 mLs by mouth every 6 (six) hours as needed for cough or to loosen phlegm.   hydrocortisone 10 MG tablet Commonly known as: CORTEF Take 1-2 tablets (10-20 mg total) by mouth See admin instructions. Take 20 mg in the am and 29m in the evening. Take after completing prednisone What changed: additional instructions   Incruse Ellipta 62.5 MCG/ACT Aepb Generic drug: umeclidinium bromide Inhale 1 puff into the lungs daily.   lamoTRIgine 150  MG tablet Commonly known as: LAMICTAL Take one tablet at bedtime. What changed:  how much to take how to take this when to take this additional instructions   magic mouthwash w/lidocaine Soln Take 5 mLs by mouth 4 (four) times daily as needed for up to 5 days for mouth pain.   montelukast 10 MG tablet Commonly  known as: SINGULAIR Take 1 tablet (10 mg total) by mouth at bedtime.   Normal Saline Flush 0.9 % Soln FLUSH WITH TWO 10 ML FLUSHES IN PORT BEFORE AND AFTER FLUIDS AND MEDICATION ADMINISTRATION, TO MAINTAIN PATENCY--USE UP TO 4 TIMES DAILY.   Nurtec 75 MG Tbdp Generic drug: Rimegepant Sulfate Take 75 mg by mouth daily as needed (Migraine).   OneTouch Delica Plus [id number] Misc 3 (three) times daily. for testing   OneTouch Verio test strip Generic drug: glucose blood 3 (three) times daily. for testing   oxyCODONE-acetaminophen 5-325 MG tablet Commonly known as: Percocet Take 1 tablet by mouth every 6 (six) hours as needed for up to 3 days for severe pain.   OXYGEN Inhale 3-4 L/min into the lungs continuous.   predniSONE 10 MG tablet Commonly known as: DELTASONE Take 4 tablets (40 mg total) by mouth daily with breakfast for 3 days, THEN 3 tablets (30 mg total) daily with breakfast for 4 days, THEN 2 tablets (20 mg total) daily with breakfast for 4 days, THEN 1 tablet (10 mg total) daily with breakfast for 4 days, THEN 0.5 tablets (5 mg total) daily with breakfast for 4 days. Start taking on: July 09, 2022   ProAir HFA 108 (90 Base) MCG/ACT inhaler Generic drug: albuterol Inhale 2 puffs into the lungs every 4 (four) hours as needed for wheezing or shortness of breath. What changed: Another medication with the same name was changed. Make sure you understand how and when to take each.   albuterol (2.5 MG/3ML) 0.083% nebulizer solution Commonly known as: PROVENTIL Take 3 mLs (2.5 mg total) by nebulization every 6 (six) hours as needed for wheezing or shortness of breath. What changed:  when to take this reasons to take this additional instructions   rosuvastatin 10 MG tablet Commonly known as: CRESTOR Take 10 mg by mouth See admin instructions. Take 10 mg by mouth mid-morning every day   sitaGLIPtin 100 MG tablet Commonly known as: Januvia Take 1 tablet (100 mg total) by  mouth daily.   sucralfate 1 GM/10ML suspension Commonly known as: CARAFATE Take 1 g by mouth daily as needed (as directed for ulcers).   Synthroid 100 MCG tablet Generic drug: levothyroxine Take 1 tablet (100 mcg total) by mouth every morning.   topiramate 50 MG tablet Commonly known as: TOPAMAX Take 150 mg by mouth at bedtime.   zolpidem 12.5 MG CR tablet Commonly known as: AMBIEN CR Take 12.5 mg by mouth at bedtime.          Time coordinating discharge: 39 minutes  Signed:  Zadiel Leyh  [facility] 07/09/2022, 9:41 AM

## 2022-07-10 ENCOUNTER — Encounter: Payer: Self-pay | Admitting: Pulmonary Disease

## 2022-07-10 DIAGNOSIS — G4719 Other hypersomnia: Secondary | ICD-10-CM

## 2022-07-11 ENCOUNTER — Ambulatory Visit: Payer: Medicare Other | Admitting: Psychiatry

## 2022-07-11 NOTE — Telephone Encounter (Signed)
Hi there.   I'm assuming you can see all the fabulous notes from my most recent stay at [facility]. I don't know what the next steps are. I can't breathe well most of the time. I have been over compensating by using my oxygen more and more. Started seeing O2 levels dipping into the upper 70s and lower 80s. My husband noticed this was happening a lot more frequently and I was also startling awake grabbing at my throat and gasping.  I start with a sniffle and within 24 hours it's pneumonia. I feel like I'm drowning in mucus all the time.  The doctors at the hospital say to get with you about getting a sleep study asap. They used some different types of nebs while I was in the ICU, but the most helpful thing was this "chest vest" the helped me cough up stuff that felt like it had been there forever.    Please help me figure out where to go from here   Dr. Verlee Monte, please advise. Dr. Loanne Drilling is unavailable.

## 2022-07-11 NOTE — Telephone Encounter (Signed)
Called and discussed with her. She says her oxygen saturation was in 70s to 80s BEFORE her most recent hospitalization. NOT since her last hospitalization. Does still have very productive cough and feels like she is gurgling on secretions at night. We were working on getting her set up for chest vest at home to help manage her chronic very productive cough despite diligent airway clearance which we're calling chronic bronchitis. Recommended that she head to the ED if she has more of these gurgling episodes at night despite her aggressive airway clearance measures. Cautioned that there will be some delay while we work on chest vest and sleep study.

## 2022-07-12 ENCOUNTER — Other Ambulatory Visit: Payer: Self-pay

## 2022-07-12 ENCOUNTER — Telehealth: Payer: Self-pay | Admitting: Student

## 2022-07-12 DIAGNOSIS — J42 Unspecified chronic bronchitis: Secondary | ICD-10-CM

## 2022-07-12 DIAGNOSIS — G8929 Other chronic pain: Secondary | ICD-10-CM

## 2022-07-12 NOTE — Telephone Encounter (Signed)
Can we set her up for smart vest for chronic bronchitis

## 2022-07-14 ENCOUNTER — Other Ambulatory Visit: Payer: Self-pay | Admitting: Hematology and Oncology

## 2022-07-15 ENCOUNTER — Encounter: Payer: Self-pay | Admitting: Cardiovascular Disease

## 2022-07-15 NOTE — Progress Notes (Unsigned)
Cardiology Office Note   Date:  07/16/2022   ID:  Susan DOWLER, DOB 12/20/1970, MRN [id number]  PCP:  Su Grand, MD  Cardiologist:   Mertie Moores, MD   Chief Complaint  Patient presents with   Hypertension   Congestive Heart Failure        Problem List:  1. Palpitations/premature ventricular contractions 2.  history of Hodgkin's lymphoma-status post mantle radiation 3. History of breast cancer-status post Reconstruction 4. History of cervical cancer 5. 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 2015 for regrowth of tumor.  6. History of thyroidectomy - hx of thyroid cancer  7. Raynaud's phenomenon 8. Appendectomy 9. History chest pains-normal heart catheterization, normal TEE 10.   history of adrenal insufficiency  11. Hyperlipidemia   Previous notes.[name] is done very well since I last saw her about a year ago. She has had some occasional palpitations that are typically controlled with metoprolol. She's not had any episodes of chest pain. She's been able to stop all of her steroids. Her adrenal glands have gradually improved and her now producing cortisol.  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  She is still working at [employer].   Feb 24, 2013:  She has done well since I last saw her .  She has had her breast implants replaced ( old ones were leaking).  Her costochondritis has resolved.  She has not been lifting.    She has run in 6 5K races this year.  She is playing tennis.    She is still in [place] school.  She is doing her orthopedic rotation.    Nov. 11, 2014:  Her HR has been high.  She has a head ache frequently.  She started Adderall recently.   The adderall has help with her focus but she feels much worse in it.   She has been on the Adderall for 2 months and the tachycardia started about 1 week ago.  She was doing cycle  classes 3 times a week.  She is exercising regularly and for the past week, her HR is extremely fast.   She has gone into menapause and has lots of hot flashes.   Feb. 23, 2015:  She has been having some recent episodes of tachycardia. We increased her metoprolol at the last visit. We performed an echocardiogram which revealed   Left ventricle: The cavity size was normal. Systolic function was normal. The estimated ejection fraction was in the range of 55% to 60%. Wall motion was normal; there were no regional wall motion abnormalities. Left ventricular diastolic function parameters were normal. - Aortic valve: Trivial regurgitation. - Mitral valve: Trivial regurgitation  She tried to stop her Adderall ( did not do well with that).   She decreased her dose slightly and she is not having much tachycardia. She has been   She has been found to have some osteoperosis and osteopenia.    March 09, 2014:  [name] is doing ok.. No recent cardiac problems.   She has been very active and is feeling great.  Playing tennis on a regular basis.     Feb. 17, 2016:   [name] is a 51 y.o. female who presents for for evaluation of palpitations , hypotension. Echo yesterday  Left ventricle: The cavity size was normal. Systolic function was   normal. The estimated ejection  fraction was in the range of 55%   to 60%. Wall motion was normal; there were no regional wall   motion abnormalities. - Aortic valve: There was trivial regurgitation. - Mitral valve: Calcified annulus.  Labs were ordered yesterday but she was not able to get them. Continues to have palpitations today She is off her Estrogen at this point.  She is back on the Toprol - skipped 2 days.  Has been taking the PRN propranolol  Not feeling well and has not been eating or drinking well. We given 1 dose of lasix   December 15, 2014:  She has continued to have palpitations  - last 20-30 seconds .  HR has remained fairly high. Has been  working out regularly .  These are going ok.    Feb 03, 2015: [name] continues to have palpitations . She wore a monitor but actually only wore is for about 1/2 of the time ( she was at [employer]) We have discussed implantable loop.  I have some concerns about doing any procedures on her .  She has only been taking the toprol 50 mg a day instead of 75.   May 17, 2015:  [name] is seen today for follow up. She has a history of hyperlipidemia and we sent her to lipid clinic. She is tolerating the Crestor very well.   Still having palpitations but learning how to manage .  Oct. 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 Has been seen at [facility] and is now at [facility]  She has sent scans to [facility]. Is on steroids to shrink tumor, has gained lots of weight.  Is hoping to be able to get a bx so that she have this tumor treated.   She is now on home O2.  O2 sats dropped to 78 while sleeping on room air.    O2 sats on 2.5 liters are good. She uses 4 lpm at night .   Is having more palpitations. Would like a refill for propranolol'   April 18, 2017:  [name] is seen today  Is still on home O2.,  Has chronic respiratory failure ( unclear etiology at this point )   Has RV failure.   Had a right heart cath.  PA pressures were only minimally elevated.  Had significant leg swelling   Has been on steroids.  Has developed pre-diabetes  Nov. 15,, 2018  In Sept 25,she woke up from a nap Ad tingling and numbness in her legs , had no strength in her legs . 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   2nd MRI of back showed a lesion between T10 -T 11.   Nothing that truly showed compression.  Was transferred to rehab unit in [place].    Has regained some motor ability I her lower abd.    Ruptured her left breast implant using her arms in rehab  Scheduled to have her implants removed later this month .   Still on home O2,  Palpitations have been well controlled.   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 ( Bronch in Dec. 2018)    Has pulmonary HTN.  Lasix is no working for her at this  point  - takes Lasix 20 mg a day   Is using a walker now - was in a wheelchair last time  Had temporary paralysis of her legs.  Improving .  Regaining her strength.    Jan. 27, 2020:  [name] is seen for follow up  Has had pressure ulders in her hands  Is on chronic steroids for her adrenal insuff.  Has had an echocardiogram at [facility] in December.  She has normal left ventricular systolic function with an ejection fraction of 55%.  She has trivial mitral regurgitation and trivial tricuspid regurgitation as well as trivial aortic insufficiency. Was no comment on pulmonary pressures.  Sept. 23, 2022:   [name] is seen  Covid infection 11 days ago ,  hreceived Manufacturing engineer. 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 16, 2023 [name] is seen for follow up of her palpitations, adrenal insufficiency Chest pain   Recently in the hospital  Hospitalized 4 times since June.  Has had URIs   She was in the ICU during this last hospitalization  Fevers / chills  Was found to have pneumonia , Temp of 102   Has diffuse whole body anasarca , pitting in her hands   Taking nebs   Coughs when she lies back  Will repeat her echo  [facility]  Start Entresto 24-26 BID ( has documented diastolic CHF)        Past Medical History:  Diagnosis Date   Addison's disease ([place])    Adrenal insufficiency ([place])    Anemia    Anxiety    Aortic stenosis    Aortic stenosis    Appendicitis 12/19/09   Appendicitis    Breast cancer  ([place])    STATUS POST BILATERAL MASTECTOMY. STATUS POST RECONSTRUCTION. SHE HAD SILICONE BREAST IMPLANTS AND THE LEFT IMPLANT IS LEAKING SLIGHTLY   Cellulitis of right middle finger 11/07/2018   Cervical cancer ([place]) 12/23/2018   Chest pain    CHF with right heart failure ([place]) 04/17/2017   Chronic respiratory failure with hypoxia ([facility]) 12/23/2018   Cough variant asthma 04/13/2019   Depression    GERD (gastroesophageal reflux disease)    takes Dexilant and carafate and gi coctail    Headache    migraines on a daily and monthly regimen    Heart murmur    History of kidney stones    Hodgkin lymphoma ([place])    STATUS POST MANTLE RADIATION   Hodgkin's lymphoma ([place])    1987   Hypertension    Hypoxia    Necrotizing fasciitis ([place]) 12/23/2018   Non-ischemic cardiomyopathy ([place])    Osteoporosis    Palpitations    Pituitary adenoma ([place]) 12/23/2018   Pneumonia    PONV (postoperative nausea and vomiting)    Pre-diabetes    per pt; no meds   Pulmonary hypertension ([place]) 12/23/2018   Raynaud phenomenon    Right heart failure ([place]) 04/17/2017   Seizures ([place])    last febrile seizure was approx 3 weeks ago per report on 12/01/2020   Sleep apnea    upcoming sleep study per pt    Supplemental oxygen dependent    3 liters   SVT (supraventricular tachycardia)    Tachycardia    Thyroid cancer ([place])    STATUS POST SURGICAL REMOVAL-CURRENT ON THYROID REPLACEMENT    Past Surgical  History:  Procedure Laterality Date   ABDOMINAL HYSTERECTOMY     AMPUTATION Left 01/30/2019   Procedure: Left Index finger amputation with flap reconstruction and repair reconstruction;  Surgeon: Roseanne Kaufman, MD;  Location: [place];  Service: Orthopedics;  Laterality: Left;   APPENDECTOMY     breast implants and removal      breast implants but leaking      CARDIAC CATHETERIZATION  05/18/09   NORMAL CATH   COLONOSCOPY     hx of chemotherapy      hx of radiation therapy      I & D EXTREMITY Left 12/23/2018   Procedure:  IRRIGATION AND DEBRIDEMENT HAND / INDEX FINGER;  Surgeon: Roseanne Kaufman, MD;  Location: [place];  Service: Orthopedics;  Laterality: Left;   IR CV LINE INJECTION  03/22/2022   IR IMAGING GUIDED PORT INSERTION  05/06/2020   IR IMAGING GUIDED PORT INSERTION  12/04/2021   IR REMOVAL TUN ACCESS W/ PORT W/O FL MOD SED  04/27/2020   IR REMOVAL TUN ACCESS W/ PORT W/O FL MOD SED  12/04/2021   IR REMOVAL TUN ACCESS W/ PORT W/O FL MOD SED  03/30/2022   KIDNEY STONE SURGERY     LUMBAR PUNCTURE W/ INTRATHECAL CHEMOTHERAPY     MASTECTOMY     PITUITARY SURGERY     RIGHT/LEFT HEART CATH AND CORONARY ANGIOGRAPHY N/A 04/02/2018   Procedure: RIGHT/LEFT HEART CATH AND CORONARY ANGIOGRAPHY;  Surgeon: Burnell Blanks, MD;  Location: [place] [facility];  Service: Cardiovascular;  Laterality: N/A;   TOOTH EXTRACTION N/A 12/05/2020   Procedure: DENTAL RESTORATION/EXTRACTIONS;  Surgeon: Ronal Fear, MD;  Location: [facility];  Service: Oral Surgery;  Laterality: N/A;   TOTAL THYROIDECTOMY     VIDEO BRONCHOSCOPY Bilateral 11/14/2018   Procedure: VIDEO BRONCHOSCOPY WITHOUT FLUORO;  Surgeon: Margaretha Seeds, MD;  Location: [place];  Service: Cardiopulmonary;  Laterality: Bilateral;   VIDEO BRONCHOSCOPY WITH ENDOBRONCHIAL ULTRASOUND N/A 11/19/2018   Procedure: VIDEO BRONCHOSCOPY WITH ENDOBRONCHIAL ULTRASOUND;  Surgeon: Margaretha Seeds, MD;  Location[facility];  Service: Thoracic;  Laterality: N/A;     Current Outpatient Medications  Medication Sig Dispense Refill   albuterol (PROVENTIL) (2.5 MG/3ML) 0.083% nebulizer solution Take 3 mLs (2.5 mg total) by nebulization every 6 (six) hours as needed for wheezing or shortness of breath. 360 mL 2   aspirin EC 81 MG tablet Take 1 tablet (81 mg total) by mouth daily. Swallow whole. 90 tablet 3   bumetanide (BUMEX) 1 MG tablet TAKE 1 TABLET BY MOUTH TWICE A DAY 180 tablet 1   buPROPion (WELLBUTRIN XL) 150 MG 24 hr tablet TAKE 1 TABLET BY MOUTH EVERY DAY 90 tablet 0    clonazePAM (KLONOPIN) 0.5 MG tablet Take 1 tablet (0.5 mg total) by mouth 2 (two) times daily as needed for anxiety. 60 tablet 2   dexlansoprazole (DEXILANT) 60 MG capsule Take 60 mg by mouth daily.     Diclofenac Potassium,Migraine, (CAMBIA) 50 MG PACK Take 50 mg by mouth daily as needed for migraine.     diphenhydrAMINE (BENADRYL) 50 MG/ML injection Inject 25 mg into the muscle once as needed (for nausea).     dronabinol (MARINOL) 2.5 MG capsule Take 1 capsule (2.5 mg total) by mouth 2 (two) times daily as needed (nausea). 30 capsule 0   EMGALITY 120 MG/ML SOSY Inject 120 mg into the skin every 28 (twenty-eight) days.     EPINEPHrine 0.3 mg/0.3 mL IJ SOAJ injection Inject 0.3  mg into the muscle as needed for anaphylaxis. 2 each 2   escitalopram (LEXAPRO) 20 MG tablet Take 1 tablet (20 mg total) by mouth every evening. 90 tablet 3   FLORASTOR 250 MG capsule Take 250 mg by mouth See admin instructions. Take 250 mg by mouth mid-morning and mid-afternoon     fluticasone-salmeterol (ADVAIR HFA) 230-21 MCG/ACT inhaler Inhale 2 puffs into the lungs 2 (two) times daily. 1 each 5   hydrocortisone (CORTEF) 10 MG tablet Take 1-2 tablets (10-20 mg total) by mouth See admin instructions. Take 20 mg in the am and 90m in the evening. Take after completing prednisone     lamoTRIgine (LAMICTAL) 150 MG tablet Take one tablet at bedtime. 90 tablet 1   Lancets (ONETOUCH DELICA PLUS LUCJARW11Y MISC 3 (three) times daily. for testing     montelukast (SINGULAIR) 10 MG tablet Take 1 tablet (10 mg total) by mouth at bedtime. 30 tablet 2   ONETOUCH VERIO test strip 3 (three) times daily. for testing     OXYGEN Inhale 3-4 L/min into the lungs continuous.     predniSONE (DELTASONE) 10 MG tablet Take 4 tablets (40 mg total) by mouth daily with breakfast for 3 days, THEN 3 tablets (30 mg total) daily with breakfast for 4 days, THEN 2 tablets (20 mg total) daily with breakfast for 4 days, THEN 1 tablet (10 mg total) daily with  breakfast for 4 days, THEN 0.5 tablets (5 mg total) daily with breakfast for 4 days. 38 tablet 0   PROAIR HFA 108 (90 Base) MCG/ACT inhaler Inhale 2 puffs into the lungs every 4 (four) hours as needed for wheezing or shortness of breath.     Rimegepant Sulfate (NURTEC) 75 MG TBDP Take 75 mg by mouth daily as needed (Migraine).      rosuvastatin (CRESTOR) 10 MG tablet Take 10 mg by mouth See admin instructions. Take 10 mg by mouth mid-morning every day     sacubitril-valsartan (ENTRESTO) 24-26 MG Take 1 tablet by mouth 2 (two) times daily. 60 tablet 11   sitaGLIPtin (JANUVIA) 100 MG tablet Take 1 tablet (100 mg total) by mouth daily.     Sodium Chloride Flush (NORMAL SALINE FLUSH) 0.9 % SOLN FLUSH WITH TWO 10 ML FLUSHES IN PORT BEFORE AND AFTER FLUIDS AND MEDICATION ADMINISTRATION, TO MAINTAIN PATENCY--USE UP TO 4 TIMES DAILY. 2400 mL 1   sucralfate (CARAFATE) 1 GM/10ML suspension Take 1 g by mouth daily as needed (as directed for ulcers).     SYNTHROID 100 MCG tablet Take 1 tablet (100 mcg total) by mouth every morning.     topiramate (TOPAMAX) 50 MG tablet Take 150 mg by mouth at bedtime.      zolpidem (AMBIEN CR) 12.5 MG CR tablet Take 12.5 mg by mouth at bedtime.     famotidine (PEPCID) 20 MG tablet Take 1 tablet (20 mg total) by mouth at bedtime. (Patient not taking: Reported on 07/16/2022) 30 tablet 2   guaiFENesin (ROBITUSSIN) 100 MG/5ML liquid Take 15 mLs by mouth every 6 (six) hours as needed for cough or to loosen phlegm. (Patient not taking: Reported on 07/16/2022) 120 mL 0   umeclidinium bromide (INCRUSE ELLIPTA) 62.5 MCG/ACT AEPB Inhale 1 puff into the lungs daily. (Patient not taking: Reported on 07/16/2022) 30 each 2   Current Facility-Administered Medications  Medication Dose Route Frequency Provider Last Rate Last Admin   0.9 %  sodium chloride infusion   Intravenous PRN EMargaretha Seeds MD  Mepolizumab SOLR 100 mg  100 mg Subcutaneous Q28 days Margaretha Seeds, MD   100 mg  at 08/31/19 1017    Allergies:   Ferrous bisglycinate chelate [iron], Mushroom extract complex, Na ferric gluc cplx in sucrose, Cymbalta [duloxetine hcl], Duloxetine, Ondansetron hcl, Promethazine, Promethazine hcl, Succinylcholine, Buprenorphine hcl, Compazine, Metoclopramide, Morphine and related, Ondansetron, Promethazine hcl, and Tegaderm ag mesh [silver]    Social History:  The patient  reports that she has never smoked. She has never used smokeless tobacco. She reports that she does not currently use alcohol. She reports that she does not use drugs.   Family History:  The patient's Family history is unknown by patient.    ROS:  Please see the history of present illness.   Physical Exam: Blood pressure 130/72, pulse (!) 122, height _0  (1.651 m), weight 179 lb 4.8 oz (81.3 kg), SpO2 98 %.       GEN:  middle age, anasarca,   mild - mod respiratory distress  HEENT: Normal NECK: No JVD; No carotid bruits LYMPHATICS: No lymphadenopathy CARDIAC: RRR , tachycardic,  3/6 systolic murmur RESPIRATORY:  Clear to auscultation without rales, wheezing or rhonchi  ABDOMEN: Soft, non-tender, non-distended MUSCULOSKELETAL:  amputation of distal aspect of her left index finger , pitting edema of her hands  SKIN: Warm and dry NEUROLOGIC:  Alert and oriented x 3    EKG:      Recent Labs: 02/09/2022: TSH 0.370 07/01/2022: ALT 29; B Natriuretic Peptide 63.5 07/06/2022: Hemoglobin 8.0; Platelets 310 07/08/2022: BUN 27; Creatinine, Ser 1.20; Magnesium 2.3; Potassium 4.4; Sodium 136    Lipid Panel    Component Value Date/Time   CHOL 178 09/22/2020 0935   CHOL 204 (H) 01/23/2018 1225   CHOL 207 (H) 12/08/2013 0823   TRIG 253 (H) 09/22/2020 0935   TRIG 96 12/08/2013 0823   HDL 44 09/22/2020 0935   HDL 60 01/23/2018 1225   HDL 57 12/08/2013 0823   CHOLHDL 4.0 09/22/2020 0935   VLDL 51 (H) 09/22/2020 0935   LDLCALC 83 09/22/2020 0935   LDLCALC 107 (H) 01/23/2018 1225   LDLCALC 131  (H) 12/08/2013 0823      Wt Readings from Last 3 Encounters:  07/16/22 179 lb 4.8 oz (81.3 kg)  07/07/22 195 lb 5.2 oz (88.6 kg)  03/28/22 177 lb (80.3 kg)      Other studies Reviewed: Additional studies/ records that were reviewed today include: . Review of the above records demonstrates:    ASSESSMENT AND PLAN:    -  Right heart failure:      has diffuse edema .  Cont bumex,  add entresto 24-26 BID .  Will see her back in several weeks  Repeat echo .   Most recent BNP was normal  Will refer to Dr. Haroldine Laws for assistance    - Palpitations/premature ventricular contractions -  No significant PVCs    -  history of Hodgkin's lymphoma-status post mantle radiation   - History of breast cancer-s/p reconstruction    4. History of cervical cancer  5. History of pituitary tumor-  6. History of thyroidectomy - hx of thyroid cancer ,      7. Raynaud's phenomenon  8. Appendectomy  9. History chest pains- . 10.   history of adrenal insufficiency  -   cont cortef    12.  Past  COVID infection:     Current medicines are reviewed at length with the patient today.  The patient does not  have concerns regarding medicines.  The following changes have been made:  no change  Disposition:   FU with me in me in several weeks    Signed, Mertie Moores, MD  07/16/2022 5:21 PM    [address], [place], [place]  [address] Phone: [phone]; Fax: [phone]

## 2022-07-16 ENCOUNTER — Encounter: Payer: Self-pay | Admitting: Cardiovascular Disease

## 2022-07-16 ENCOUNTER — Ambulatory Visit: Payer: BC Managed Care – PPO | Attending: Cardiovascular Disease | Admitting: Cardiovascular Disease

## 2022-07-16 VITALS — BP 130/72 | HR 122 | Ht 65.0 in | Wt 179.3 lb

## 2022-07-16 DIAGNOSIS — I50810 Right heart failure, unspecified: Secondary | ICD-10-CM

## 2022-07-16 DIAGNOSIS — R0600 Dyspnea, unspecified: Secondary | ICD-10-CM

## 2022-07-16 MED ORDER — ASPIRIN 81 MG PO TBEC: 81.0000 mg | DELAYED_RELEASE_TABLET | Freq: Every day | ORAL | 3 refills | Status: DC

## 2022-07-16 MED ORDER — ENTRESTO 24-26 MG PO TABS: 1.0000 | ORAL_TABLET | Freq: Two times a day (BID) | ORAL | 11 refills | Status: DC

## 2022-07-16 NOTE — Patient Instructions (Addendum)
Medication Instructions:  Your physician has recommended you make the following change in your medication:  1-START Entresto 24/26 mg by mouth twice daily. 2-DECREASE Aspirin 81 mg by mouth daily  *If you need a refill on your cardiac medications before your next appointment, please call your pharmacy*  Lab Work: If you have labs (blood work) drawn today and your tests are completely normal, you will receive your results only by: [place] (if you have MyChart) OR A paper copy in the mail If you have any lab test that is abnormal or we need to change your treatment, we will call you to review the results.  Testing/Procedures: Your physician has requested that you have an echocardiogram. Echocardiography is a painless test that uses sound waves to create images of your heart. It provides your doctor with information about the size and shape of your heart and how well your heart's chambers and valves are working. This procedure takes approximately one hour. There are no restrictions for this procedure. Please do NOT wear cologne, perfume, aftershave, or lotions (deodorant is allowed). Please arrive 15 minutes prior to your appointment time.  Follow-Up: At [facility], you and your health needs are our priority.  As part of our continuing mission to provide you with exceptional heart care, we have created designated Provider Care Teams.  These Care Teams include your primary Cardiologist (physician) and Advanced Practice Providers (APPs -  Physician Assistants and Nurse Practitioners) who all work together to provide you with the care you need, when you need it.  We recommend signing up for the patient portal called "MyChart".  Sign up information is provided on this After Visit Summary.  MyChart is used to connect with patients for Virtual Visits (Telemedicine).  Patients are able to view lab/test results, encounter notes, upcoming appointments, etc.  Non-urgent messages can be  sent to your provider as well.   To learn more about what you can do with MyChart, go to [email or web address].    Your next appointment:   3 to 4 weeks  The format for your next appointment:   In Person  Provider:   Mertie Moores, MD     Other Instructions You have been referred to Heart Failure Clinic   Important Information About Sugar

## 2022-07-16 NOTE — Telephone Encounter (Signed)
Please ensure patient has smart vest

## 2022-07-16 NOTE — Telephone Encounter (Signed)
Please arrange for follow-up with NP for hospital follow-up to ensure she has chest vest and sleep evaluation.

## 2022-07-16 NOTE — Telephone Encounter (Signed)
Spk to patient, smart vest ordered

## 2022-07-16 NOTE — Progress Notes (Deleted)
History of Present Illness [name] is a 51 y.o. female  with history of hodgkin's lymphoma, breast cancer, cervical cancer, thyroid cancer, hypothyroidism, addison's disease, GERD, asthma, chronic hypoxemic respiratory failure, HFpEF, hypertension, anxiety/depression, and chronic pain syndrome who was admitted for sepsis due to pneumonia on 07/01/22. For her asthma she was started on Nucala in 09/2019, but was  lost to follow-up . She was followed by PCCM as an inpatient. She sees Dr. Loanne Drilling as an outpatient  Hospital Course:    Following conditions were addressed during hospitalization as listed below, Admit date: 07/01/2022 Discharge date: 07/09/2022   Sepsis secondary to left lower lobe pneumonia, immunocompromised patient.   Patient presented with hypoxemia lactic acidosis and leukocytosis.  Respiratory viral panel was positive for rhinovirus.  COVID-19 negative.  Blood culture negative in> 5 days.  Sputum with gram-positive rods and Candida.  Patient was initiated on Augmentin and plan was to continue 10-day course.  Patient will be given 3 more days of Augmentin on discharge to complete the course.  Was initially on Rocephin and Zithromax.  Patient was encouraged to continue bronchodilators/incentive spirometry on discharge.  Pulmonary followed the patient during hospitalization.  We will also add Incruse on discharge.   Acute kidney injury improved with IV fluids.  Started back on Bumex.  Continue to monitor BMP.  Latest creatinine of 1.2   Hypokalemia: Improved after replacement.   Chronic hypoxemic respiratory failure: On 4 L of oxygen at baseline.  We will continue on discharge.  We will follow-up with pulmonary as outpatient.   Chronic diastolic heart failure, pulmonary hypertension:  Continue beta-blockers, Bumex.     Adrenal insufficiency:  We will continue prednisone taper on discharge.  She will resume Solu-Cortef from her home doses 20/10 mg after completing prednisone  taper..   Anxiety, depression, insomnia.  Multiple pain issues and nausea. On  Klonopin, Wellbutrin, Marinol, Lexapro, Lamictal and Ambien.     Hyperglycemia associated with steroid use:  On Tradjenta   Shaking/tremors: No evidence of seizure disorder.  EEG was normal.  CT head was normal.   Disposition.  At this time, patient is stable for disposition home with outpatient PCP and pulmonary follow-up   07/16/2022 Pt. Presents for follow up. She was recently admitted 07/01/2022  for acute exacerbation of her asthma, Chronic respiratory failure with hypoxia ([place]) AKI (acute kidney injury) ([place]) Sepsis ([place]) secondary to Community acquired pneumonia and anxiety. She had  rhinovirus pneumonia/bronchitis with severe asthma exacerbation. She had upper airway wheeze concerning for questionable upper airway inflammation vs vocal cord dysfunction. She had significant anxiety component to her presentation as well, and shaking and tremors that were ruled out for seizures. Although previous bronchoscopy not notable for excessive dynamic airway compression/tracheobronchomalacia her HRCT in 2021 was suggestive of EDAC which could be contributing to her upper airway wheeze.She was treated with antibiotics, prednisone taper, scheduled nebulizers, and chest vest with mucolytic . Additionally she was given prn nebulizer treatments for her cough. There was concern for OSA, and plan was to pursue a sleep study as an outpatient. She was discharged 07/09/2022.   Pt. States that since discharge she has been doing well. She states she has        Test Results:     Latest Ref Rng & Units 07/06/2022    8:20 AM 07/03/2022    5:29 AM 07/01/2022    1:36 PM  CBC  WBC 4.0 - 10.5 K/uL 10.7  17.8  16.5  Hemoglobin 12.0 - 15.0 g/dL 8.0  8.7  9.8   Hematocrit 36.0 - 46.0 % 26.6  29.4  32.2   Platelets 150 - 400 K/uL 310  328  328        Latest Ref Rng & Units 07/08/2022    3:08 AM 07/06/2022    8:20 AM 07/03/2022     5:29 AM  BMP  Glucose 70 - 99 mg/dL 122  89  160   BUN 6 - 20 mg/dL _0 Creatinine 0.44 - 1.00 mg/dL 1.20  1.13  1.10   Sodium 135 - 145 mmol/L 136  139  139   Potassium 3.5 - 5.1 mmol/L 4.4  2.8  4.2   Chloride 98 - 111 mmol/L 102  103  106   CO2 22 - 32 mmol/L _1 Calcium 8.9 - 10.3 mg/dL 9.4  8.4  9.3     BNP    Component Value Date/Time   BNP 63.5 07/01/2022 1257    ProBNP No results found for: "PROBNP"  PFT    Component Value Date/Time   FEV1PRE 1.79 04/16/2019 1202   FEV1POST 2.03 04/16/2019 1202   FVCPRE 2.34 04/16/2019 1202   FVCPOST 2.42 04/16/2019 1202   TLC 4.40 04/16/2019 1202   DLCOUNC 16.53 04/16/2019 1202   PREFEV1FVCRT 76 04/16/2019 1202   PSTFEV1FVCRT 84 04/16/2019 1202    EEG adult  Result Date: 07/05/2022 Lora Havens, MD     07/05/2022  8:49 AM Patient Name: [name] MRN: [id number] Epilepsy Attending: Lora Havens Referring Physician/Provider: Barb Merino, MD Date: 07/04/2022 Duration: 24.40 mins Patient history: 51yo F noted to have shaking and tremors with episodes of unresponsiveness today morning, on examination at bedside breathing normal, 100% on 3 L oxygen, patient is with flickering eyes, she will withdraw to pain, tone normal.  No evidence of tonic-clonic seizure. EEG to evaluate for seizure. Level of alertness: Awake AEDs during EEG study:  TPM, LTG Technical aspects: This EEG study was done with scalp electrodes positioned according to the 10-20 International system of electrode placement. Electrical activity was reviewed with band pass filter of 1-70Hz, sensitivity of 7 uV/mm, display speed of 68m/sec with a 60Hz notched filter applied as appropriate. EEG data were recorded continuously and digitally stored.  Video monitoring was available and reviewed as appropriate. Description: The posterior dominant rhythm consists of 9-10 Hz activity of moderate voltage (25-35 uV) seen predominantly in posterior head regions,  symmetric and reactive to eye opening and eye closing. Physiologic photic driving was seen during photic stimulation.  Hyperventilation was not performed.   Of note, parts of study were technically difficult due to significant movement artifact. IMPRESSION: This study is within normal limits. No seizures or epileptiform discharges were seen throughout the recording. A normal interictal EEG does not exclude the diagnosis of epilepsy. Priyanka OBarbra Sarks  CT HEAD WO CONTRAST (5MM)  Result Date: 07/04/2022 CLINICAL DATA:  Mental status changes EXAM: CT HEAD WITHOUT CONTRAST TECHNIQUE: Contiguous axial images were obtained from the base of the skull through the vertex without intravenous contrast. RADIATION DOSE REDUCTION: This exam was performed according to the departmental dose-optimization program which includes automated exposure control, adjustment of the mA and/or kV according to patient size and/or use of iterative reconstruction technique. COMPARISON:  02/10/2020 FINDINGS: Brain: No evidence of acute infarction, hemorrhage, hydrocephalus, extra-axial collection or mass lesion/mass effect. Vascular: No hyperdense vessel or unexpected calcification.  Skull: No osseous abnormality. Sinuses/Orbits: Visualized paranasal sinuses are clear. Visualized mastoid sinuses are clear. Visualized orbits demonstrate no focal abnormality. Other: None IMPRESSION: No acute intracranial findings. Electronically Signed   By: Kathreen Devoid M.D.   On: 07/04/2022 15:23   DG CHEST PORT 1 VIEW  Result Date: 07/02/2022 CLINICAL DATA:  Shortness of breath EXAM: PORTABLE CHEST 1 VIEW COMPARISON:  07/01/2022 FINDINGS: Single frontal view of the chest demonstrates a stable cardiac silhouette. There is persistent retrocardiac consolidation, unchanged since prior exam. No effusion or pneumothorax. No acute bony abnormalities. IMPRESSION: 1. Stable left basilar consolidation, which may reflect infection or aspiration. Electronically Signed    By: Randa Ngo M.D.   On: 07/02/2022 18:20   DG Chest 2 View  Result Date: 07/01/2022 CLINICAL DATA:  Cough for 3 days. EXAM: CHEST - 2 VIEW COMPARISON:  04/05/2022.  CT, 04/04/2020. FINDINGS: Cardiac silhouette is normal in size and configuration. Normal mediastinal and hilar contours. Streaky and patchy opacity in the left lower lobe. Remainder of the lungs is clear. No pleural effusion or pneumothorax. Skeletal structures are intact.  Previous breast surgery. IMPRESSION: 1. Left lower lobe opacity consistent with pneumonia. Electronically Signed   By: Lajean Manes M.D.   On: 07/01/2022 11:50     Past medical hx Past Medical History:  Diagnosis Date   Addison's disease ([place])    Adrenal insufficiency ([place])    Anemia    Anxiety    Aortic stenosis    Aortic stenosis    Appendicitis 12/19/09   Appendicitis    Breast cancer (H[facility])    STATUS POST BILATERAL MASTECTOMY. STATUS POST RECONSTRUCTION. SHE HAD SILICONE BREAST IMPLANTS AND THE LEFT IMPLANT IS LEAKING SLIGHTLY   Cellulitis of right middle finger 11/07/2018   Cervical cancer ([place]) 12/23/2018   Chest pain    CHF with right heart failure ([place]) 04/17/2017   Chronic respiratory failure with hypoxia (HCC) 12/23/2018   Cough variant asthma 04/13/2019   Depression    GERD (gastroesophageal reflux disease)    takes Dexilant and carafate and gi coctail    Headache    migraines on a daily and monthly regimen    Heart murmur    History of kidney stones    Hodgkin lymphoma ([place])    STATUS POST MANTLE RADIATION   Hodgkin's lymphoma ([place])    1987   Hypertension    Hypoxia    Necrotizing fasciitis ([place]) 12/23/2018   Non-ischemic cardiomyopathy ([place])    Osteoporosis    Palpitations    Pituitary adenoma ([place]) 12/23/2018   Pneumonia    PONV (postoperative nausea and vomiting)    Pre-diabetes    per pt; no meds   Pulmonary hypertension ([place]) 12/23/2018   Raynaud phenomenon    Right heart failure ([place]) 04/17/2017   Seizures ([place])    last  febrile seizure was approx 3 weeks ago per report on 12/01/2020   Sleep apnea    upcoming sleep study per pt    Supplemental oxygen dependent    3 liters   SVT (supraventricular tachycardia)    Tachycardia    Thyroid cancer ([place])    STATUS POST SURGICAL REMOVAL-CURRENT ON THYROID REPLACEMENT     Social History   Tobacco Use   Smoking status: Never   Smokeless tobacco: Never  Vaping Use   Vaping Use: Never used  Substance Use Topics   Alcohol use: Not Currently    Comment: social    Drug use: No  Ms.Cuadros reports that she has never smoked. She has never used smokeless tobacco. She reports that she does not currently use alcohol. She reports that she does not use drugs.  Tobacco Cessation: Counseling given: Not Answered   Past surgical hx, Family hx, Social hx all reviewed.  Current Outpatient Medications on File Prior to Visit  Medication Sig   albuterol (PROVENTIL) (2.5 MG/3ML) 0.083% nebulizer solution Take 3 mLs (2.5 mg total) by nebulization every 6 (six) hours as needed for wheezing or shortness of breath.   aspirin 325 MG tablet Take 325 mg by mouth at bedtime.    bumetanide (BUMEX) 1 MG tablet TAKE 1 TABLET BY MOUTH TWICE A DAY (Patient not taking: Reported on 07/01/2022)   buPROPion (WELLBUTRIN XL) 150 MG 24 hr tablet TAKE 1 TABLET BY MOUTH EVERY DAY (Patient taking differently: Take 150 mg by mouth See admin instructions. Take 150 mg by mouth mid-morning every day)   clonazePAM (KLONOPIN) 0.5 MG tablet Take 1 tablet (0.5 mg total) by mouth 2 (two) times daily as needed for anxiety. (Patient taking differently: Take 0.5 mg by mouth See admin instructions. Take 0.5 mg by mouth every morning and afternoon)   dexlansoprazole (DEXILANT) 60 MG capsule Take 60 mg by mouth daily.   Diclofenac Potassium,Migraine, (CAMBIA) 50 MG PACK Take 50 mg by mouth daily as needed for migraine.   diphenhydrAMINE (BENADRYL) 50 MG/ML injection Inject 25 mg into the muscle once as needed (for  nausea).   dronabinol (MARINOL) 2.5 MG capsule Take 1 capsule (2.5 mg total) by mouth 2 (two) times daily as needed (nausea). (Patient taking differently: Take 2.5 mg by mouth every evening.)   EMGALITY 120 MG/ML SOSY Inject 120 mg into the skin every 28 (twenty-eight) days.   EPINEPHrine 0.3 mg/0.3 mL IJ SOAJ injection Inject 0.3 mg into the muscle as needed for anaphylaxis.   escitalopram (LEXAPRO) 20 MG tablet Take 1 tablet (20 mg total) by mouth every evening. (Patient taking differently: Take 20 mg by mouth See admin instructions. Take 20 mg by mouth mid-afternoon every day)   famotidine (PEPCID) 20 MG tablet Take 1 tablet (20 mg total) by mouth at bedtime.   FLORASTOR 250 MG capsule Take 250 mg by mouth See admin instructions. Take 250 mg by mouth mid-morning and mid-afternoon   fluticasone-salmeterol (ADVAIR HFA) 230-21 MCG/ACT inhaler Inhale 2 puffs into the lungs 2 (two) times daily. (Patient taking differently: Inhale 2 puffs into the lungs daily.)   guaiFENesin (ROBITUSSIN) 100 MG/5ML liquid Take 15 mLs by mouth every 6 (six) hours as needed for cough or to loosen phlegm.   hydrocortisone (CORTEF) 10 MG tablet Take 1-2 tablets (10-20 mg total) by mouth See admin instructions. Take 20 mg in the am and 46m in the evening. Take after completing prednisone   lamoTRIgine (LAMICTAL) 150 MG tablet Take one tablet at bedtime. (Patient taking differently: Take 150 mg by mouth at bedtime.)   Lancets (ONETOUCH DELICA PLUS [id number] MISC 3 (three) times daily. for testing   montelukast (SINGULAIR) 10 MG tablet Take 1 tablet (10 mg total) by mouth at bedtime.   ONETOUCH VERIO test strip 3 (three) times daily. for testing   OXYGEN Inhale 3-4 L/min into the lungs continuous.   predniSONE (DELTASONE) 10 MG tablet Take 4 tablets (40 mg total) by mouth daily with breakfast for 3 days, THEN 3 tablets (30 mg total) daily with breakfast for 4 days, THEN 2 tablets (20 mg total) daily with breakfast for 4 days,  THEN 1 tablet (10 mg total) daily with breakfast for 4 days, THEN 0.5 tablets (5 mg total) daily with breakfast for 4 days.   PROAIR HFA 108 (90 Base) MCG/ACT inhaler Inhale 2 puffs into the lungs every 4 (four) hours as needed for wheezing or shortness of breath.   Rimegepant Sulfate (NURTEC) 75 MG TBDP Take 75 mg by mouth daily as needed (Migraine).    rosuvastatin (CRESTOR) 10 MG tablet Take 10 mg by mouth See admin instructions. Take 10 mg by mouth mid-morning every day   sitaGLIPtin (JANUVIA) 100 MG tablet Take 1 tablet (100 mg total) by mouth daily.   Sodium Chloride Flush (NORMAL SALINE FLUSH) 0.9 % SOLN FLUSH WITH TWO 10 ML FLUSHES IN PORT BEFORE AND AFTER FLUIDS AND MEDICATION ADMINISTRATION, TO MAINTAIN PATENCY--USE UP TO 4 TIMES DAILY. (Patient not taking: Reported on 07/01/2022)   sucralfate (CARAFATE) 1 GM/10ML suspension Take 1 g by mouth daily as needed (as directed for ulcers).   SYNTHROID 100 MCG tablet Take 1 tablet (100 mcg total) by mouth every morning.   topiramate (TOPAMAX) 50 MG tablet Take 150 mg by mouth at bedtime.    umeclidinium bromide (INCRUSE ELLIPTA) 62.5 MCG/ACT AEPB Inhale 1 puff into the lungs daily.   zolpidem (AMBIEN CR) 12.5 MG CR tablet Take 12.5 mg by mouth at bedtime.   Current Facility-Administered Medications on File Prior to Visit  Medication   0.9 %  sodium chloride infusion   Mepolizumab SOLR 100 mg     Allergies  Allergen Reactions   Ferrous Bisglycinate Chelate [Iron] Anaphylaxis and Other (See Comments)    Only IV - only FERRLICET   Mushroom Extract Complex Anaphylaxis   Na Ferric Gluc Cplx In Sucrose Anaphylaxis   Cymbalta [Duloxetine Hcl] Swelling and Anxiety   Ondansetron Hcl Other (See Comments)   Promethazine Other (See Comments)    Other reaction(s): Unknown   Promethazine Hcl     Other reaction(s): Unknown   Succinylcholine Other (See Comments)    Lock Jaw   Buprenorphine Hcl Hives   Compazine Other (See Comments)    Altered  mental status Aggression   Metoclopramide Other (See Comments)    Dystonia   Morphine And Related Hives   Ondansetron Hives and Rash    Other reaction(s): Unknown Other reaction(s): Unknown   Promethazine Hcl Hives   Tegaderm Ag Mesh [Silver] Rash    Old formulation only, is able to tolerate new formulation    Review Of Systems:  Constitutional:   No  weight loss, night sweats,  Fevers, chills, fatigue, or  lassitude.  HEENT:   No headaches,  Difficulty swallowing,  Tooth/dental problems, or  Sore throat,                No sneezing, itching, ear ache, nasal congestion, post nasal drip,   CV:  No chest pain,  Orthopnea, PND, swelling in lower extremities, anasarca, dizziness, palpitations, syncope.   GI  No heartburn, indigestion, abdominal pain, nausea, vomiting, diarrhea, change in bowel habits, loss of appetite, bloody stools.   Resp: No shortness of breath with exertion or at rest.  No excess mucus, no productive cough,  No non-productive cough,  No coughing up of blood.  No change in color of mucus.  No wheezing.  No chest wall deformity  Skin: no rash or lesions.  GU: no dysuria, change in color of urine, no urgency or frequency.  No flank pain, no hematuria   MS:  No joint pain  or swelling.  No decreased range of motion.  No back pain.  Psych:  No change in mood or affect. No depression or anxiety.  No memory loss.   Vital Signs LMP  (LMP Unknown)    Physical Exam:  General- No distress,  A&Ox3 ENT: No sinus tenderness, TM clear, pale nasal mucosa, no oral exudate,no post nasal drip, no LAN Cardiac: S1, S2, regular rate and rhythm, no murmur Chest: No wheeze/ rales/ dullness; no accessory muscle use, no nasal flaring, no sternal retractions Abd.: Soft Non-tender Ext: No clubbing cyanosis, edema Neuro:  normal strength Skin: No rashes, warm and dry Psych: normal mood and behavior   Assessment/Plan Resolving Asthma / CAP with sepsis  Plan: - Continue  wearing oxygen at 4 L [place]. Saturation goals are > 88% at all times - Call to be seen or seek emergency care for sats that are consistently below 88% - continue oral prednisone taper, can add on her home hydrocort towards end of taper - continue budesonide + brovana twice daily and yupelri daily, resume advair hfa 230 2 puffs twice daily at discharge, and would start incruse at discharge - scheduled guaifenesin - continue vest q4 while awake per pt request - we'll see if we can get this at home for her - message sent to CM, consult placed to TOC - PRN duonebs q4hrs - PRN lidocaine nebulizer treatment for cough - She is on pantoprazole 69m daily, continue 221mfamotidine at bedtime  - Recommend eating at least 2 hours before bed, sleep with head of bed 30 degrees or higher.  - Recommend reducing her soda intake. - Augmentin per ID  Chronic Hypoxic Respiratory Failure Plan - Continue wearing oxygen at 4 L [place]. Saturation goals are > 88% at all times - Call to be seen or seek emergency care for sats that are consistently below 88%  Chronic Heart Failure , PAH Plan - Continue beta-blockers, Bumex.  - Follow up cardiology    Concern for OSA Plan - We will order a home sleep test    Acute Kidney Injury Plan Follow up with PCP for labwork Continue Bumex   No problem-specific Assessment & Plan notes found for this encounter.    SaMagdalen SpatzNP 07/16/2022  12:26 PM

## 2022-07-17 ENCOUNTER — Encounter: Payer: Self-pay | Admitting: Primary Care

## 2022-07-17 ENCOUNTER — Ambulatory Visit (INDEPENDENT_AMBULATORY_CARE_PROVIDER_SITE_OTHER): Payer: BC Managed Care – PPO | Admitting: Primary Care

## 2022-07-17 ENCOUNTER — Telehealth: Payer: Self-pay | Admitting: Primary Care

## 2022-07-17 ENCOUNTER — Ambulatory Visit: Payer: BC Managed Care – PPO | Admitting: Acute Care

## 2022-07-17 ENCOUNTER — Ambulatory Visit (INDEPENDENT_AMBULATORY_CARE_PROVIDER_SITE_OTHER): Payer: BC Managed Care – PPO

## 2022-07-17 DIAGNOSIS — I50810 Right heart failure, unspecified: Secondary | ICD-10-CM | POA: Diagnosis not present

## 2022-07-17 DIAGNOSIS — J189 Pneumonia, unspecified organism: Secondary | ICD-10-CM

## 2022-07-17 DIAGNOSIS — J9611 Chronic respiratory failure with hypoxia: Secondary | ICD-10-CM

## 2022-07-17 DIAGNOSIS — J4540 Moderate persistent asthma, uncomplicated: Secondary | ICD-10-CM

## 2022-07-17 DIAGNOSIS — R0602 Shortness of breath: Secondary | ICD-10-CM | POA: Diagnosis not present

## 2022-07-17 NOTE — Assessment & Plan Note (Addendum)
-  O2 dependent; Continue 4L supplemental oxygen - Recommend patient not use POC at this time. Placing an order for her to be provided with portable oxygen tanks

## 2022-07-17 NOTE — Telephone Encounter (Signed)
Pt has been scheduled for 11/1.  Spoke to pt & gave her appt info.  Nothing further needed.

## 2022-07-17 NOTE — Telephone Encounter (Signed)
Dr. Verlee Monte ordered in-lab sleep study, this patient needs it urgently please. Can we follow up on this.   She was also ordered for a chest vest which she states was approved but she has not received a call, can we follow-up on this as well  Cc: Raquel Sarna Pinion

## 2022-07-17 NOTE — Telephone Encounter (Signed)
BW completed SmartVest form and I faxed today with records.

## 2022-07-17 NOTE — Addendum Note (Signed)
Addended by: Alvin Critchley on: 07/17/2022 01:37 PM   Modules accepted: Orders

## 2022-07-17 NOTE — Progress Notes (Addendum)
_0  ID: [name], female    DOB: 05-17-1971, 51 y.o.   MRN: [id number]  Chief Complaint  Patient presents with   Hospitalization Follow-up    She is having some shortness of breath with exertion, and was recently in the hospital.     Referring provider: Toppin, Antionette Poles, MD  HPI: 51 year old female, never smoked.  Past medical history significant for hypertension, moderate persistent asthma, chronic respiratory failure with hypoxia, manage acquired pneumonia, Addison's disease, thyroid cancer, Hodgkin's lymphoma, cervical cancer, breast cancer.  Hospital course  51 year old female with past medical history of multiple cancers with chronic hypoxemia on 4 L of oxygen by nasal cannula, history of Addison's disease, GERD, hypothyroidism, chronic pain syndrome with recurrent admissions to the hospital and recurrent admissions to the hospital presented to the hospital with fatigue, weakness, fever and shortness of breath not improved with inhalers at home.  In the ED, patient was febrile with a temperature 102.3, WBC count 16.5, blood pressures 90/58, lactic acid 3.  Chest x-ray with left lower lobe infiltrate.  Patient was then admitted to the hospital for sepsis secondary to pneumonia.  Patient had rapid response this on admission for wheezing.  PCCM was consulted.  Viral panel was positive for rhinovirus.  Patient did have slow recovery during hospitalization  Sepsis secondary to left lower lobe pneumonia, immunocompromised patient.   Patient presented with hypoxemia lactic acidosis and leukocytosis.  Respiratory viral panel was positive for rhinovirus.  COVID-19 negative.  Blood culture negative in> 5 days.  Sputum with gram-positive rods and Candida.  Patient was initiated on Augmentin and plan was to continue 10-day course.  Patient will be given 3 more days of Augmentin on discharge to complete the course.  Was initially on Rocephin and Zithromax.  Patient was encouraged to  bronchodilators/incentive spirometry on discharge.  Pulmonary followed the patient during hospitalization.  We will also add Incruse on discharge.   Acute kidney injury improved with IV fluids.  Started back on Bumex.  Continue to monitor BMP.  Latest creatinine of 1.2   Hypokalemia: Improved after replacement.   Chronic hypoxemic respiratory failure: On 4 L of oxygen at baseline.  We will continue on discharge.  We will follow-up with pulmonary as outpatient.  Chronic diastolic heart failure, pulmonary hypertension:  Continue beta-blockers, Bumex.    Adrenal insufficiency:  We will continue prednisone taper on discharge.  She will resume Solu-Cortef from her home doses 20/10 mg after completing prednisone taper..  07/17/2022 Patient presents today for hospital follow-up. She has a history of multiple cancers with chronic hypoxemia on 4 L of oxygen.  Recurrent admissions to the hospital. Patient was most recently admitted from 07/01/2022 - 07/09/2022 for sepsis secondary to community-acquired pneumonia and severe persistent asthma with acute exacerbation.  She originally presented to ED with temperature 102.3, WBC 16.5, blood pressure 90/58, lactic acid 3. Resp viral panel was positive for rhinovirus. Chest x-ray with left lower lobe infiltrate.  She was discharged on oral Augmentin and prednisone taper.   She saw Dr. Sharrell Ku yesterday with cardiology, concern worsening systolic heart murmur. She has generalized pitting edema. Unable to lay flat at night. She has an adjustable bed. Planning for echo and heart cath. She was referred to CHF clinic. She was started on Entresto.  She has a productive cough with some purulence. She is maintained on Advair 230/21 mcg 2 puffs twice daily, Incruse one puff daily and guaifenesin 15 mL every 6 hours as needed for cough.  She uses her Albuterol nebulizer 2-3 times a day. She finished abx and prednisone. She back on Cortef 74m qam and 136mqp. No fevers.     Allergies  Allergen Reactions   Ferrous Bisglycinate Chelate [Iron] Anaphylaxis and Other (See Comments)    Only IV - only FERRLICET   Mushroom Extract Complex Anaphylaxis   Na Ferric Gluc Cplx In Sucrose Anaphylaxis   Cymbalta [Duloxetine Hcl] Swelling and Anxiety   Ondansetron Hcl Other (See Comments)   Promethazine Other (See Comments)    Other reaction(s): Unknown   Promethazine Hcl     Other reaction(s): Unknown   Succinylcholine Other (See Comments)    Lock Jaw   Buprenorphine Hcl Hives   Compazine Other (See Comments)    Altered mental status Aggression   Duloxetine     Other reaction(s): Not available   Metoclopramide Other (See Comments)    Dystonia   Morphine And Related Hives   Ondansetron Hives and Rash    Other reaction(s): Unknown Other reaction(s): Unknown   Promethazine Hcl Hives   Tegaderm Ag Mesh [Silver] Rash    Old formulation only, is able to tolerate new formulation    Immunization History  Administered Date(s) Administered   Influenza, High Dose Seasonal PF 06/19/2019   Influenza, Quadrivalent, Recombinant, Inj, Pf 08/13/2018   Influenza, Seasonal, Injecte, Preservative Fre 06/23/2016, 11/12/2017, 08/13/2018   Influenza,inj,Quad PF,6+ Mos 07/04/2015, 07/04/2015, 06/23/2016, 07/03/2017   Influenza,inj,Quad PF,6-35 Mos 07/04/2015   Influenza-Unspecified 07/23/2007, 07/06/2008, 06/13/2009, 07/06/2010, 07/25/2011, 08/01/2012, 07/14/2013, 07/04/2015, 07/04/2015, 06/23/2016, 06/23/2016, 07/03/2017, 11/12/2017, 06/19/2019, 06/30/2020, 06/01/2021   Moderna Sars-Covid-2 Vaccination 12/15/2019, 01/12/2020   Pneumococcal Conjugate-13 03/20/2019   Pneumococcal Polysaccharide-23 02/21/2017   Pneumococcal-Unspecified 02/21/2017   Td 12/19/2010   Varicella 04/02/2007    Past Medical History:  Diagnosis Date   [place]ison's disease ([facility]   Adrenal insufficiency (HCC)    Anemia    Anxiety    Aortic stenosis    Aortic stenosis    Appendicitis 12/19/09    Appendicitis    Breast cancer (HCC)    STATUS POST BILATERAL MASTECTOMY. STATUS POST RECONSTRUCTION. SHE HAD SILICONE BREAST IMPLANTS AND THE LEFT IMPLANT IS LEAKING SLIGHTLY   Cellulitis of right middle finger 11/07/2018   Cervical cancer (HC[place]3/24/2020   Chest pain    CHF with right heart failure (HCR[place]7/18/2018   Chronic respiratory failure with hypoxia (HCC) 12/23/2018   Cough variant asthma 04/13/2019   Depression    GERD (gastroesophageal reflux disease)    takes Dexilant and carafate and gi coctail    Headache    migraines on a daily and monthly regimen    Heart murmur    History of kidney stones    Hodgkin lymphoma ([facility]   STATUS POST MANTLE RADIATION   [place]od[place]kin's lymphoma ([facility]   1987   Hypertension    Hypoxia    Necrotizing fasciitis (H[facility]3/24/2020   Non-ischemic cardiomyopathy ([facility]   Osteoporosis    Palpitations    Pituitary adenoma ([facility] [place]3/24/2020   Pneumonia    PONV (postoperative nausea and vomiting)    Pre-diabetes    per pt; no meds   Pulmonary hypertension ([facility]CL[place]3/24/2020   Raynaud phenomenon    Right heart failure (HC[facility] [place]7/18/2018   Seizures ([facility]   last febrile seizure was approx 3 weeks ago per report on 12/01/2020   Sleep apnea    upcoming sleep study per pt    Supplemental oxygen dependent    3 liters  SVT (supraventricular tachycardia)    Tachycardia    Thyroid cancer (HCC)    STATUS POST SURGICAL REMOVAL-CURRENT ON THYROID REPLACEMENT    Tobacco History: Social History   Tobacco Use  Smoking Status Never  Smokeless Tobacco Never   Counseling given: Not Answered   Outpatient Medications Prior to Visit  Medication Sig Dispense Refill   albuterol (PROVENTIL) (2.5 MG/3ML) 0.083% nebulizer solution Take 3 mLs (2.5 mg total) by nebulization every 6 (six) hours as needed for wheezing or shortness of breath. 360 mL 2   aspirin EC 81 MG tablet Take 1 tablet (81 mg total) by mouth daily. Swallow whole. 90 tablet 3   bumetanide (BUMEX) 1 MG  tablet TAKE 1 TABLET BY MOUTH TWICE A DAY 180 tablet 1   buPROPion (WELLBUTRIN XL) 150 MG 24 hr tablet TAKE 1 TABLET BY MOUTH EVERY DAY 90 tablet 0   clonazePAM (KLONOPIN) 0.5 MG tablet Take 1 tablet (0.5 mg total) by mouth 2 (two) times daily as needed for anxiety. 60 tablet 2   dexlansoprazole (DEXILANT) 60 MG capsule Take 60 mg by mouth daily.     Diclofenac Potassium,Migraine, (CAMBIA) 50 MG PACK Take 50 mg by mouth daily as needed for migraine.     diphenhydrAMINE (BENADRYL) 50 MG/ML injection Inject 25 mg into the muscle once as needed (for nausea).     EMGALITY 120 MG/ML SOSY Inject 120 mg into the skin every 28 (twenty-eight) days.     EPINEPHrine 0.3 mg/0.3 mL IJ SOAJ injection Inject 0.3 mg into the muscle as needed for anaphylaxis. 2 each 2   escitalopram (LEXAPRO) 20 MG tablet Take 1 tablet (20 mg total) by mouth every evening. 90 tablet 3   famotidine (PEPCID) 20 MG tablet Take 1 tablet (20 mg total) by mouth at bedtime. 30 tablet 2   FLORASTOR 250 MG capsule Take 250 mg by mouth See admin instructions. Take 250 mg by mouth mid-morning and mid-afternoon     fluticasone-salmeterol (ADVAIR HFA) 230-21 MCG/ACT inhaler Inhale 2 puffs into the lungs 2 (two) times daily. 1 each 5   guaiFENesin (ROBITUSSIN) 100 MG/5ML liquid Take 15 mLs by mouth every 6 (six) hours as needed for cough or to loosen phlegm. 120 mL 0   hydrocortisone (CORTEF) 10 MG tablet Take 1-2 tablets (10-20 mg total) by mouth See admin instructions. Take 20 mg in the am and 26m in the evening. Take after completing prednisone     lamoTRIgine (LAMICTAL) 150 MG tablet Take one tablet at bedtime. 90 tablet 1   Lancets (ONETOUCH DELICA PLUS [id number] MISC 3 (three) times daily. for testing     montelukast (SINGULAIR) 10 MG tablet Take 1 tablet (10 mg total) by mouth at bedtime. 30 tablet 2   ONETOUCH VERIO test strip 3 (three) times daily. for testing     OXYGEN Inhale 3-4 L/min into the lungs continuous.     PROAIR HFA 108  (90 Base) MCG/ACT inhaler Inhale 2 puffs into the lungs every 4 (four) hours as needed for wheezing or shortness of breath.     Rimegepant Sulfate (NURTEC) 75 MG TBDP Take 75 mg by mouth daily as needed (Migraine).      rosuvastatin (CRESTOR) 10 MG tablet Take 10 mg by mouth See admin instructions. Take 10 mg by mouth mid-morning every day     sacubitril-valsartan (ENTRESTO) 24-26 MG Take 1 tablet by mouth 2 (two) times daily. 60 tablet 11   sitaGLIPtin (JANUVIA) 100 MG tablet Take 1 tablet (  100 mg total) by mouth daily.     sucralfate (CARAFATE) 1 GM/10ML suspension Take 1 g by mouth daily as needed (as directed for ulcers).     SYNTHROID 100 MCG tablet Take 1 tablet (100 mcg total) by mouth every morning.     topiramate (TOPAMAX) 50 MG tablet Take 150 mg by mouth at bedtime.      umeclidinium bromide (INCRUSE ELLIPTA) 62.5 MCG/ACT AEPB Inhale 1 puff into the lungs daily. 30 each 2   zolpidem (AMBIEN CR) 12.5 MG CR tablet Take 12.5 mg by mouth at bedtime.     dronabinol (MARINOL) 2.5 MG capsule Take 1 capsule (2.5 mg total) by mouth 2 (two) times daily as needed (nausea). 30 capsule 0   predniSONE (DELTASONE) 10 MG tablet Take 4 tablets (40 mg total) by mouth daily with breakfast for 3 days, THEN 3 tablets (30 mg total) daily with breakfast for 4 days, THEN 2 tablets (20 mg total) daily with breakfast for 4 days, THEN 1 tablet (10 mg total) daily with breakfast for 4 days, THEN 0.5 tablets (5 mg total) daily with breakfast for 4 days. 38 tablet 0   Sodium Chloride Flush (NORMAL SALINE FLUSH) 0.9 % SOLN FLUSH WITH TWO 10 ML FLUSHES IN PORT BEFORE AND AFTER FLUIDS AND MEDICATION ADMINISTRATION, TO MAINTAIN PATENCY--USE UP TO 4 TIMES DAILY. 2400 mL 1   Facility-Administered Medications Prior to Visit  Medication Dose Route Frequency Provider Last Rate Last Admin   0.9 %  sodium chloride infusion   Intravenous PRN Margaretha Seeds, MD       Mepolizumab SOLR 100 mg  100 mg Subcutaneous Q28 days  Margaretha Seeds, MD   100 mg at 08/31/19 1017   Review of Systems  Review of Systems  Constitutional:  Positive for fatigue.  Respiratory:  Positive for cough.   Cardiovascular:  Positive for leg swelling.   Physical Exam  BP 120/70 (BP Location: Left Arm, Cuff Size: Normal)   Pulse (!) 110   LMP  (LMP Unknown)   SpO2 99%  Physical Exam Constitutional:      Appearance: Normal appearance.     Comments: Chronically ill appearing   HENT:     Head: Normocephalic and atraumatic.     Mouth/Throat:     Comments: Deferred d/t masking Cardiovascular:     Rate and Rhythm: Normal rate and regular rhythm.     Heart sounds: Murmur heard.     Comments: +anasarca/generalized pitting edema  Pulmonary:     Effort: Pulmonary effort is normal.     Breath sounds: Normal breath sounds. No wheezing, rhonchi or rales.     Comments: Diminished left base; reactive cough  O2 99% on 4L Musculoskeletal:        General: Normal range of motion.     Comments: In WC  Skin:    General: Skin is warm and dry.  Neurological:     General: No focal deficit present.     Mental Status: She is alert and oriented to person, place, and time. Mental status is at baseline.  Psychiatric:        Mood and Affect: Mood normal.        Behavior: Behavior normal.        Thought Content: Thought content normal.        Judgment: Judgment normal.      Lab Results:  CBC    Component Value Date/Time   WBC 10.7 (H) 07/06/2022 0820  RBC 3.20 (L) 07/06/2022 0820   HGB 8.0 (L) 07/06/2022 0820   HGB 10.4 (L) 02/09/2022 1202   HGB 12.0 12/21/2019 1418   HCT 26.6 (L) 07/06/2022 0820   HCT 39.9 04/01/2018 1617   PLT 310 07/06/2022 0820   PLT 357 02/09/2022 1202   PLT 327 04/01/2018 1617   MCV 83.1 07/06/2022 0820   MCV 89 04/01/2018 1617   MCH 25.0 (L) 07/06/2022 0820   MCHC 30.1 07/06/2022 0820   RDW 18.3 (H) 07/06/2022 0820   RDW 13.6 04/01/2018 1617   LYMPHSABS 2.0 07/06/2022 0820   MONOABS 0.8 07/06/2022  0820   EOSABS 0.2 07/06/2022 0820   BASOSABS 0.0 07/06/2022 0820    BMET    Component Value Date/Time   NA 136 07/08/2022 0308   NA 141 04/01/2018 1617   K 4.4 07/08/2022 0308   CL 102 07/08/2022 0308   CO2 26 07/08/2022 0308   GLUCOSE 122 (H) 07/08/2022 0308   BUN 27 (H) 07/08/2022 0308   BUN 11 04/01/2018 1617   CREATININE 1.20 (H) 07/08/2022 0308   CREATININE 1.08 (H) 02/09/2022 1202   CALCIUM 9.4 07/08/2022 0308   GFRNONAA 55 (L) 07/08/2022 0308   GFRNONAA >60 02/09/2022 1202   GFRAA >60 04/27/2020 0613   GFRAA >60 02/10/2020 1020    BNP    Component Value Date/Time   BNP 63.5 07/01/2022 1257    ProBNP No results found for: "PROBNP"  Imaging: DG Chest 2 View  Result Date: 07/17/2022 CLINICAL DATA:  Follow-up left basilar consolidation. Shortness of breath. EXAM: CHEST - 2 VIEW COMPARISON:  07/02/2022 FINDINGS: Normal sized heart. Clear lungs with normal vascularity. Mild-to-moderate peribronchial thickening. Bilateral axillary surgical clips. Unremarkable bones. IMPRESSION: Mild to moderate bronchitic changes.  No pneumonia. Electronically Signed   By: Claudie Revering M.D.   On: 07/17/2022 12:22   EEG adult  Result Date: 07/05/2022 Lora Havens, MD     07/05/2022  8:49 AM Patient Name: [name] MRN: [id number] Epilepsy Attending: Lora Havens Referring Physician/Provider: Barb Merino, MD Date: 07/04/2022 Duration: 24.40 mins Patient history: 51yo F noted to have shaking and tremors with episodes of unresponsiveness today morning, on examination at bedside breathing normal, 100% on 3 L oxygen, patient is with flickering eyes, she will withdraw to pain, tone normal.  No evidence of tonic-clonic seizure. EEG to evaluate for seizure. Level of alertness: Awake AEDs during EEG study:  TPM, LTG Technical aspects: This EEG study was done with scalp electrodes positioned according to the 10-20 International system of electrode placement. Electrical activity was  reviewed with band pass filter of 1-_0 , sensitivity of 7 uV/mm, display speed of 98m/sec with a _1  notched filter applied as appropriate. EEG data were recorded continuously and digitally stored.  Video monitoring was available and reviewed as appropriate. Description: The posterior dominant rhythm consists of 9-10 Hz activity of moderate voltage (25-35 uV) seen predominantly in posterior head regions, symmetric and reactive to eye opening and eye closing. Physiologic photic driving was seen during photic stimulation.  Hyperventilation was not performed.   Of note, parts of study were technically difficult due to significant movement artifact. IMPRESSION: This study is within normal limits. No seizures or epileptiform discharges were seen throughout the recording. A normal interictal EEG does not exclude the diagnosis of epilepsy. Priyanka OBarbra Sarks  CT HEAD WO CONTRAST (5MM)  Result Date: 07/04/2022 CLINICAL DATA:  Mental status changes EXAM: CT HEAD WITHOUT CONTRAST TECHNIQUE: Contiguous axial images  were obtained from the base of the skull through the vertex without intravenous contrast. RADIATION DOSE REDUCTION: This exam was performed according to the departmental dose-optimization program which includes automated exposure control, adjustment of the mA and/or kV according to patient size and/or use of iterative reconstruction technique. COMPARISON:  02/10/2020 FINDINGS: Brain: No evidence of acute infarction, hemorrhage, hydrocephalus, extra-axial collection or mass lesion/mass effect. Vascular: No hyperdense vessel or unexpected calcification. Skull: No osseous abnormality. Sinuses/Orbits: Visualized paranasal sinuses are clear. Visualized mastoid sinuses are clear. Visualized orbits demonstrate no focal abnormality. Other: None IMPRESSION: No acute intracranial findings. Electronically Signed   By: Kathreen Devoid M.D.   On: 07/04/2022 15:23   DG CHEST PORT 1 VIEW  Result Date: 07/02/2022 CLINICAL  DATA:  Shortness of breath EXAM: PORTABLE CHEST 1 VIEW COMPARISON:  07/01/2022 FINDINGS: Single frontal view of the chest demonstrates a stable cardiac silhouette. There is persistent retrocardiac consolidation, unchanged since prior exam. No effusion or pneumothorax. No acute bony abnormalities. IMPRESSION: 1. Stable left basilar consolidation, which may reflect infection or aspiration. Electronically Signed   By: Randa Ngo M.D.   On: 07/02/2022 18:20   DG Chest 2 View  Result Date: 07/01/2022 CLINICAL DATA:  Cough for 3 days. EXAM: CHEST - 2 VIEW COMPARISON:  04/05/2022.  CT, 04/04/2020. FINDINGS: Cardiac silhouette is normal in size and configuration. Normal mediastinal and hilar contours. Streaky and patchy opacity in the left lower lobe. Remainder of the lungs is clear. No pleural effusion or pneumothorax. Skeletal structures are intact.  Previous breast surgery. IMPRESSION: 1. Left lower lobe opacity consistent with pneumonia. Electronically Signed   By: Lajean Manes M.D.   On: 07/01/2022 11:50     Assessment & Plan:   Community acquired pneumonia - Admitted for sepsis secondary to CAP d/t rhinovirus. She completed 10 day course of Augmentin. Continues to have productive cough. She has tried and fail traditional airway clearance techniques including expectorants, BD and flutter valve while in-patient. Ordered for therapy vest d/t recurrent respiratory infections requiring hospitalization and IV/oral antibiotics. Repeat CXR today showed mild-moderate bronchitic changes, no pneumonia. Continue Robitussin 54m every 6 hours for cough/chest congestion.   Chronic respiratory failure with hypoxia (HCC) - O2 dependent; Continue 4L supplemental oxygen - Recommend patient not use POC at this time. Placing an order for her to be provided with portable oxygen tanks   Moderate persistent asthma without complication - Continue Advair and Incruse  Right heart failure ([facility] - Following with  cardiology, saw Dr. NTommie Sams - She has generalized pitting edema  - Continue bumex, added entresto twice daily - Repeat echo - Referred to Dr. BMahalia Longestwith HF clinic   40 mins spent on case: >50% face to face with patient   EMartyn Ehrich NP 07/20/2022

## 2022-07-17 NOTE — Assessment & Plan Note (Addendum)
-  Following with cardiology, saw Dr. Tommie Sams  - She has generalized pitting edema  - Continue bumex, added entresto twice daily - Repeat echo - Referred to Dr. Mahalia Longest with HF clinic

## 2022-07-17 NOTE — Assessment & Plan Note (Signed)
-  Continue Advair and Incruse

## 2022-07-17 NOTE — Progress Notes (Signed)
Please let patient know lungs were clear.  She has mild to moderate bronchial thickening consistent with bronchitic Angettes.  No pneumonia.  No indication for further antibiotics at this time.  Start therapy vest when she gets it.  Continue Robitussin.

## 2022-07-17 NOTE — Assessment & Plan Note (Addendum)
-  Admitted for sepsis secondary to CAP d/t rhinovirus. She completed 10 day course of Augmentin. Continues to have productive cough. She has tried and fail traditional airway clearance techniques including expectorants, BD and flutter valve while in-patient. Ordered for therapy vest d/t recurrent respiratory infections requiring hospitalization and IV/oral antibiotics. Repeat CXR today showed mild-moderate bronchitic changes, no pneumonia. Continue Robitussin 32m every 6 hours for cough/chest congestion.

## 2022-07-17 NOTE — Telephone Encounter (Signed)
Sleep study order was placed on 10/11 & was given to [facility] to precert.  I sent her a message on this.  [facility] has SmartVest order and is holding for Dr [employer].

## 2022-07-17 NOTE — Telephone Encounter (Signed)
Thanks

## 2022-07-17 NOTE — Telephone Encounter (Signed)
[name],   The Sleep study has been authorized.  Will you please schedule & notify the pt?   Thank you,  [facility]

## 2022-07-17 NOTE — Patient Instructions (Addendum)
Recommendations: - Continue Advair 2 puffs morning and evening - Continue Incruse 1 puff daily in the morning - Continue Bumex and Entresto per cardiology  - Take Robitussin 31m every 6 hours as needed for cough - Continue 4L oxygen 24/L, maintain O2 >88-90% (POC is not always the best to use cause it is pulsed oxygen, would use continuous tanks if leaving home for now)  Orders; - CXR today re: CAP  - Needs O2 tanks, on 4L continuous oxygen   Follow-up; - First opening with Dr. ELoanne Drilling

## 2022-07-18 ENCOUNTER — Ambulatory Visit: Payer: BC Managed Care – PPO | Admitting: Psychiatry

## 2022-07-18 DIAGNOSIS — F331 Major depressive disorder, recurrent, moderate: Secondary | ICD-10-CM

## 2022-07-18 NOTE — Progress Notes (Deleted)
[facility] Counselor/Therapist Progress Note  Patient ID: LORRAINA SPRING, MRN: [id number],    Date: 07/18/2022  Time Spent: 48 minutes   Virtual Visit via Telehealth Note Connected with patient by a telemedicine/telehealth application, with their informed consent, and verified patient privacy and that I am speaking with the correct person using two identifiers. I discussed the limitations, risks, security and privacy concerns of performing psychotherapy and the availability of in person appointments. I also discussed with the patient that there may be a patient responsible charge related to this service. The patient expressed understanding and agreed to proceed. I discussed the treatment planning with the patient. The patient was provided an opportunity to ask questions and all were answered. The patient agreed with the plan and demonstrated an understanding of the instructions. The patient was advised to call  our office if  symptoms worsen or feel they are in a crisis state and need immediate contact.   Therapist Location: office Patient Location: home   Treatment Type: Individual Therapy  Reported Symptoms: depression, anxiety, feeling "useless, angry, less hope", tearfulness,  Denies any SI  Mental Status Exam:  Appearance:   Casual     Behavior:  Appropriate, Sharing, and Motivated  Motor:  Normal  Speech/Language:   Clear and Coherent  Affect:  Depressed and anxious  Mood:  anxious/ depressed  Thought process:  goal directed  Thought content:    Obsessive thoughts  Sensory/Perceptual disturbances:    WNL  Orientation:  oriented to person, place, time/date, situation, day of week, month of year, year, and stated date of Oct. 18, 2023  Attention:  Fair  Concentration:  Fair  Memory:  [place] of knowledge:   Good  Insight:    Good and Fair  Judgment:   Good  Impulse Control:  Good   Risk Assessment: Danger to Self:  No Self-injurious Behavior: No Danger to  Others: No Duty to Warn:no Physical Aggression / Violence:No  Access to Firearms a concern: No  Gang Involvement:No   Subjective:  Patient today reporting anxiety, depression, tearfulness, "feeling useless, angry, less hope" but denies any SI.  Been in hospital recently due to "pulmonary and blood pressure issues".  Needed session today to voice and process her anxiety, depression, and struggle with her medical symptoms most recently which has become more challenging. Has remained out of hospital  since her recent discharge. (Multiple Primary Malignant Neoplasms). Some concern remain with her 51 yr old who is "on the spectrum". Reports she is noticing her physical decline more, some hopelessness, and "no thoughts to harm myself". Feels it does help to talk and be able to cry and talk about what all she is dealing with medically. Shares that she and husband are doing better in their relationship at this point. Talks through some concerns/issues re: her dad (who is also having medical concerns).  Encouraged her to let go of the self-blaming in that situation as things were out of her control based on what she has shared. Husband more supportive. Neighbors helpful and supportive. Trying to "not be as hard on herself" and let others help.   Interventions: Cognitive Behavioral Therapy and Ego-Supportive  Treatment goals: Treatment goals remain on treatment plan as patient works with strategies to achieve her goals.  Progress is assessed each session and documented in the "subject" and/or "plan" section of treatment note. Long-term goal: This goal is not longer applicable for patient due to her decline in physical health.  Will be updating this goal and possibly others upon review at next session.  Elevate mood and show evidence of usual energy, activities, and socialization level. Help her determine what she feels is realistic for her considering her health challenges. Short-term goal: Participate in  social contacts and initiate communication of needs and desires.  Strategies: Describe the signs and symptoms of depression that are experienced and work to find and use helpful coping tools to better manage depressed feelings.   Diagnosis:   ICD-10-CM   1. Major depressive disorder, recurrent episode, moderate ([place])  F33.1      Plan:  Patient today showing active participation and motivation in session as she focused on coping with her physical decline and the anxiety and depressive symptoms that are coexisting for patient.  Denies any SI but is feeling "useless, angry, and less hope" more recently and was able to share openly what this is feeling like for her.  Did share that she and husband have improved their relationship.  Has concerns about her dad and his health and trying to accept she cannot physically be of help to him at this point but does maintain contact with him.  Some self blaming that she worked on today and is trying to "let go of".  As noted above, husband and neighbors are very supportive.  Less family dysfunction reported at this session.  States that her brother does remain her strongest supporter.  Patient trying to be more accepting of her limitations currently and let others help and ways that are beneficial to her.  Review of goals and plan to update next session. Encouraged patient's focus on goal-directed behaviors and especially her on self-care, freely asking for what she needs from people that she knows will respond and be helpful to her.  Her physical limitations recently have prevented her from being able to come in the office for appointments but she is hoping the next 1 can be in person which I have encouraged as well as long as she is able.  Goal review and progress/challenges noted with patient.  Next appointment within 3 weeks.  This record has been created using [employer].  Chart creation errors have been sought, but may not always have been located and  corrected.  Such creation errors do not reflect on the standard of medical care provided.   Shanon Ace, LCSW

## 2022-07-18 NOTE — Progress Notes (Addendum)
[facility] Counselor/Therapist Progress Note  Patient ID: [name], MRN: [id number],    Date: 07/18/2022  Time Spent: 48 minutes   Virtual Visit via Telehealth Note; MyChart video session Connected with patient by a telemedicine/telehealth application, with their informed consent, and verified patient privacy and that I am speaking with the correct person using two identifiers. I discussed the limitations, risks, security and privacy concerns of performing psychotherapy and the availability of in person appointments. I also discussed with the patient that there may be a patient responsible charge related to this service. The patient expressed understanding and agreed to proceed. I discussed the treatment planning with the patient. The patient was provided an opportunity to ask questions and all were answered. The patient agreed with the plan and demonstrated an understanding of the instructions. The patient was advised to call  our office if  symptoms worsen or feel they are in a crisis state and need immediate contact.   Therapist Location: office Patient Location: home   Treatment Type: Individual Therapy  Reported Symptoms: depression, anxiety, feeling "useless, angry, less hope", tearfulness,  Denies any SI  Mental Status Exam:  Appearance:   Casual     Behavior:  Appropriate, Sharing, and Motivated  Motor:  Normal  Speech/Language:   Clear and Coherent  Affect:  Depressed and anxious  Mood:  anxious/ depressed  Thought process:  goal directed  Thought content:    Obsessive thoughts  Sensory/Perceptual disturbances:    WNL  Orientation:  oriented to person, place, time/date, situation, day of week, month of year, year, and stated date of Oct. 18, 2023  Attention:  Fair  Concentration:  Fair  Memory:  [place] of knowledge:   Good  Insight:    Good and Fair  Judgment:   Good  Impulse Control:  Good   Risk Assessment: Danger to Self:  No Self-injurious  Behavior: No Danger to Others: No Duty to Warn:no Physical Aggression / Violence:No  Access to Firearms a concern: No  Gang Involvement:No   Subjective:  Patient today reporting anxiety, depression, tearfulness, "feeling useless, angry, less hope" but denies any SI.  Been in hospital recently due to "pulmonary and blood pressure issues".  Needed session today to voice and process her anxiety, depression, and struggle with her medical symptoms most recently which has become more challenging. Has remained out of hospital  since her recent discharge. (Multiple Primary Malignant Neoplasms). Some concern remain with her 51 yr old who is "on the spectrum". Reports she is noticing her physical decline more, some hopelessness, and "no thoughts to harm myself". Feels it does help to talk and be able to cry and talk about what all she is dealing with medically. Shares that she and husband are doing better in their relationship at this point. Talks through some concerns/issues re: her dad (who is also having medical concerns).  Encouraged her to let go of the self-blaming in that situation as things were out of her control based on what she has shared. Husband more supportive. Neighbors helpful and supportive. Trying to "not be as hard on herself" and let others help.   Interventions: Cognitive Behavioral Therapy and Ego-Supportive  Treatment goals: Treatment goals remain on treatment plan as patient works with strategies to achieve her goals.  Progress is assessed each session and documented in the "subject" and/or "plan" section of treatment note. Long-term goal: This goal is not longer applicable for patient due to her decline in  physical health.  Will be updating this goal and possibly others upon review at next session.  Elevate mood and show evidence of usual energy, activities, and socialization level. Help her determine what she feels is realistic for her considering her health challenges. Short-term  goal: Participate in social contacts and initiate communication of needs and desires.  Strategies: Describe the signs and symptoms of depression that are experienced and work to find and use helpful coping tools to better manage depressed feelings.   Diagnosis:   ICD-10-CM   1. Major depressive disorder, recurrent episode, moderate ([place])  F33.1      Plan:  Patient today showing active participation and motivation in session as she focused on coping with her physical decline and the anxiety and depressive symptoms that are coexisting for patient.  Denies any SI but is feeling "useless, angry, and less hope" more recently and was able to share openly what this is feeling like for her.  Did share that she and husband have improved their relationship.  Has concerns about her dad and his health and trying to accept she cannot physically be of help to him at this point but does maintain contact with him.  Some self blaming that she worked on today and is trying to "let go of".  As noted above, husband and neighbors are very supportive.  Less family dysfunction reported at this session.  States that her brother does remain her strongest supporter.  Patient trying to be more accepting of her limitations currently and let others help and ways that are beneficial to her.  Review of goals and plan to update next session. Encouraged patient's focus on goal-directed behaviors and especially her on self-care, freely asking for what she needs from people that she knows will respond and be helpful to her.  Her physical limitations recently have prevented her from being able to come in the office for appointments but she is hoping the next 1 can be in person which I have encouraged as well as long as she is able.  Goal review and progress/challenges noted with patient.  Next appointment within 3 weeks.  This record has been created using [employer].  Chart creation errors have been sought, but may not always  have been located and corrected.  Such creation errors do not reflect on the standard of medical care provided.   Shanon Ace, LCSW

## 2022-07-19 ENCOUNTER — Telehealth: Payer: Self-pay | Admitting: *Deleted

## 2022-07-19 ENCOUNTER — Telehealth: Payer: Self-pay | Admitting: Primary Care

## 2022-07-19 ENCOUNTER — Other Ambulatory Visit: Payer: Self-pay | Admitting: Hematology and Oncology

## 2022-07-19 MED ORDER — DRONABINOL 2.5 MG PO CAPS: 2.5000 mg | ORAL_CAPSULE | Freq: Two times a day (BID) | ORAL | 0 refills | Status: DC | PRN

## 2022-07-19 NOTE — Telephone Encounter (Signed)
Jasmine from [facility] calling [phone] to clarify order for o2 tank specification.  Please call and advise.

## 2022-07-19 NOTE — Telephone Encounter (Signed)
Called and left voicemail for [facility] to call office back to get o2 tank matter cleared up. Pt is on 4L continuous I reported in voicemail for [place]. Will wait for call back from [employer].

## 2022-07-19 NOTE — Telephone Encounter (Signed)
Diagnosis and add a airway clearance tried and failed sentence. Should be able to get insurance approval. [name] phone number is [phone].

## 2022-07-20 ENCOUNTER — Telehealth: Payer: Self-pay | Admitting: Primary Care

## 2022-07-20 MED ORDER — AMOXICILLIN-POT CLAVULANATE 875-125 MG PO TABS: 1.0000 | ORAL_TABLET | Freq: Two times a day (BID) | ORAL | 0 refills | Status: DC

## 2022-07-20 NOTE — Telephone Encounter (Signed)
Called and spoke with patient. Patient stated that she had 3 days worth of Augmentin to take that she got from the hospital. Advised patient to still pick up the prescription that Geraldo Pitter called in for her to take for an additional  5-7 days for her cough.   Patient verbalized understanding.   Nothing further needed.

## 2022-07-20 NOTE — Telephone Encounter (Signed)
Hi [name],  We are working with your insurance and have not gotten the vest approved yet. They have certain criteria that need to be met. Have you had any other respiratory infections that were treated outside of our system that we need to know about? Also have you had productive cough for more than 6 months? Please let me know any other respiratory symptoms or concerns that you may have experienced in the last year. 

## 2022-07-20 NOTE — Telephone Encounter (Signed)
Called and spoke with patient. Patient stated that every time she coughs up mucus it is blood tinged. Patient stated that the last 2 days she's had a really bad cough. Patient stated that she wants to know what's going on with the chest vest. Patient also stated that she doesn't feel bad she feels okay. She also wanted to know what was going on with the oxygen tank.   BW, please advise.

## 2022-07-20 NOTE — Telephone Encounter (Addendum)
Upon further questioning by CMA, patient states that she already has a prescription for Augmentin at home. She states that she never took abx that was given to her at discharge from hospital. I have advised she take and complete antibiotic.  Ronney Asters can you make sure she has enough Augmentin to take 1 tablet twice a day for 5 (total of 10 tablets). If so, please call pharmacy and cancel order that was just sent

## 2022-07-20 NOTE — Telephone Encounter (Signed)
Called and spoke with patient. She verbalized understanding.   Nothing further needed.

## 2022-07-20 NOTE — Addendum Note (Signed)
Addended by: Martyn Ehrich on: 07/20/2022 02:50 PM   Modules accepted: Orders

## 2022-07-20 NOTE — Telephone Encounter (Signed)
-  Have her take robitussin DM q6 hours  - Chest vest has not been approved by insurance, I am working on it. It will take a bit of time for her to get that  - Please call DME company and ask about oxygen tanks, we should have placed an order for her to received portable tanks

## 2022-07-20 NOTE — Telephone Encounter (Signed)
Beth, please advise on the information about pt and the smart vest.

## 2022-07-20 NOTE — Telephone Encounter (Signed)
I will send in additional 5 day course of augmentin d/t bronchitic changes seen on CXR and persistent cough. We are working on getting her portable oxygen tanks and smart vest. Recommending robitussin-dm for cough.

## 2022-07-20 NOTE — Telephone Encounter (Signed)
I'm confused, Non-ischemic cardiomyopathy is not an indicate for a chest vest. I would not order it for this.  I can add to my note that she has tried and failed airway clearances. But I will not add that diagnosis code.  I will CC: Dr. Loanne Drilling who originally prescribed chest vest for her insight/help on this.

## 2022-07-20 NOTE — Telephone Encounter (Signed)
It was ordered for recurrent pneumonia requiring hospitalization and IV/oral antibiotics

## 2022-07-23 ENCOUNTER — Telehealth: Payer: Self-pay

## 2022-07-23 NOTE — Telephone Encounter (Addendum)
Patient requesting Palliative Care evaluation via mychart message. Reasonable given her multiple health issues. Once Palliative team receives referral, I recommend they contact me as I am familiar with her complicated history.   Right now, she has pending cardiac work-up to confirm cardiac function and if pulmonary hypertension is contributing to her symptoms.  Plan Palliative consulted placed

## 2022-07-23 NOTE — Addendum Note (Signed)
Addended by: Rodman Pickle on: 07/23/2022 02:00 PM   Modules accepted: Orders

## 2022-07-23 NOTE — Telephone Encounter (Signed)
Spoke with patient and scheduled a Mychart Palliative Consult for 07/24/22 @ 12:30 PM.  Consent obtained; updated Netsmart, Team List and Epic.

## 2022-07-23 NOTE — Telephone Encounter (Signed)
I can not tell what the issue is here [facility] with [employer], there was no answer- LMTCB

## 2022-07-24 ENCOUNTER — Telehealth: Payer: Medicare Other | Admitting: Nurse Practitioner

## 2022-07-24 ENCOUNTER — Encounter: Payer: Self-pay | Admitting: Nurse Practitioner

## 2022-07-24 DIAGNOSIS — Z515 Encounter for palliative care: Secondary | ICD-10-CM

## 2022-07-24 DIAGNOSIS — R110 Nausea: Secondary | ICD-10-CM

## 2022-07-24 DIAGNOSIS — R5381 Other malaise: Secondary | ICD-10-CM

## 2022-07-24 NOTE — Progress Notes (Signed)
[facility] Consult Note Telephone: [phone]  Fax: [phone]   Date of encounter: 07/24/22 1:44 PM PATIENT NAME: [name] [place] [id number]   [phone] (home)  DOB: 12-02-1970 MRN: [id number] PRIMARY CARE PROVIDER:    Su Grand, MD,  [address] [place] [address] [phone]  REFERRING PROVIDER:   Su Grand, MD [address] [place],  [place] [address] [phone]  RESPONSIBLE PARTY:    Contact Information     Name Relation Home Work Mobile   [name] Spouse [phone]  [phone]       Due to the COVID-19 crisis, this visit was done via telemedicine from my office and it was initiated and consent by this patient and or family.  I connected with [name], husband with  [name] [facility] on 07/24/22 by a video enabled telemedicine application and verified that I am speaking with the correct person using two identifiers.   I discussed the limitations of evaluation and management by telemedicine. The patient expressed understanding and agreed to proceed. Palliative Care was asked to follow this patient by consultation request of  Toppin, Antionette Poles, MD to address advance care planning and complex medical decision making. This is the initial visit.                      ASSESSMENT AND PLAN / RECOMMENDATIONS:  Symptom Management/Plan: 1. Advance Care Planning;  Ongoing discussions  2. Goals of Care: Goals include to maximize quality of life and symptom management. Our advance care planning conversation included a discussion about:    The value and importance of advance care planning  Exploration of personal, cultural or spiritual beliefs that might influence medical decisions  Exploration of goals of care in the event of a sudden injury or illness  Identification and preparation of a healthcare agent  Review and updating or creation of  an advance directive document.  3. Chronic pain secondary to debility, multiple co-morbidities; discussed at length about alternative pain modality. We talked about trying arnicare otc rather than voltaren which has not been working. Will further discuss option of physical therapy at tomorrow visit. Continue to monitor on pain scale, Discussed at length to continue with psychotherapy, psychiatry, recommended to further explore chronic pain  4. Chronic nausea, discussed, medications reviewed, concern for wishes for home IV access, described as a "hard stick", using IV benadryl at home for nausea/vomiting. Recommended to continue controlling gerd s/s. Reviewed nutrition.   Noted: wishes for IV access at home, recently requesting port from medical oncology (who declined due to no active cancer dx), documented d/c summary from 03/31/2022 for home use of iv benadryl for nausea/vomiting; dexilant  5. Palliative care encounter; Palliative care encounter; Palliative medicine team will continue to support patient, patient's family, and medical team. Visit consisted of counseling and education dealing with the complex and emotionally intense issues of symptom management and palliative care in the setting of serious and potentially life-threatening illness  Follow up Palliative Care Visit: Palliative care will continue to follow for complex medical decision making, advance care planning, and clarification of goals. Return tomorrow for f/u [name] visit, scheduled   I spent 61 minutes providing this consultation. More than 50% of the time in this consultation was spent in counseling and care coordination. PPS: 60% Chief Complaint: Initial palliative consult for complex medical decision making, address goals, manage ongoing  symptoms  HISTORY OF PRESENT ILLNESS:  [name] is a 51 y.o. 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   Hospitalized 03/28/2022 to 03/31/2022 for bacteremia due to Enterobacter aerogenes  and gram-negative bacilli in the blood culture.bacteremia secondary to right sided;   Hospitalization 04/05/2022 to 04/09/2022 for Sepsis, POA with recent Enterobacter aerogenes bacteremia, temperature 102.1 F with tachycardia, tachypnea and elevated WBC count of 15.2 with evidence of acute endorgan damage with renal failure with creatinine 1.53 on admission. Acute renal failure resolved, left flank pain hx of nephrolithiasis.   Hospitalized 07/01/2022 to 07/09/2022 for Severe persistent asthma with acute exacerbation, Sepsis secondary to left lower lobe pneumonia, immunocompromised patient, Respiratory viral panel was positive for rhinovirus, AKI improved with IVF, chronic hypoxemic respiratory failure, on 4L/[place] at baseline, chronic diastolic HF/pulmonary HTN (documented 07/09/2022 d/c summary), adrenal insuffiency, prednisone taper, resume solucortef per home dose. Multiple pain issues/chronic nausea, changed from xanax to klonopin, continues to take wellbutrin, lexapro, lamictal, ambien.   I called [name] for initial telemedicine by video with her husband [name] for Initial PC visit. We talked about past medical history at length. We talked about recent hospitalizations. We talked about ros including fatigue, concerns for chronic pain describing as all over. We talked about inability to sleep despite taking ambien  for which she has a pending sleep study. We talked about appetite with chronic nausea, nutrition. Medications, family, social history reviewed. We talked about functional abilities. We talked extensively about past treatments, multiple specialists. Ms. Ronda talked about counseling and her psychiatrist. Ms. Vannostrand endorses with visit questions are asked then there is not a lot of time for her to talk. We talked about coping strategies in the setting of chronic debilitating disease that impacts her daily life functioning and quality of life. We talked about medical goals including what Ms. Eppolito expressed were "band aid the symptoms".We talked about her wishes for care. Ms. Ast endorses she goes to specialists and continues not to feel well overall, wishes to improve to where she can function by doing activities with her family symptoms free from chronic pain, chronic nausea, and cognitive impairment. We talked about cognitive impairment of not being able to remember things such as how to manage her medications, or forgetting people or things. Ms. Merida was tearful talking about these episodes where she feels like she is overall declining in her general health, "my heart and lungs are failing". We talked about realistic expectations and concerns. Discussed role briefly of pc in poc. Discussed with lengthily visit today will schedule f/u visit for tomorrow, Ms. Dortch in agreement. Therapeutic listening, emotional support provided. Questions answered.  Contact information provided.   History obtained from review of EMR, discussion with primary team, and interview with family, facility staff/caregiver and/or [name].  I reviewed available labs, medications, imaging, studies and related documents from the EMR.  Records reviewed and summarized above.   ROS 10 point system reviewed +chronic pain, +fatigue, +insomnia, +chronic nausea see HPI  Physical Exam: deferred CURRENT  PROBLEM LIST:  Patient Active  Problem List   Diagnosis Date Noted   Community acquired pneumonia    Severe persistent asthma with acute exacerbation    Anxiety    Anxiety and depression 04/06/2022   GERD without esophagitis 04/06/2022   Sepsis ([facility]) 04/06/2022   PICC (peripherally inserted central catheter) in place    Bacteremia due to Enterobacter species 03/29/2022   Normocytic anemia 03/29/2022   Major depressive disorder, recurrent episode, moderate (HCC)    Generalized anxiety disorder    Seizure-like activity ([place]) 12/08/2020   High anion gap metabolic acidosis 78/93/8101   Hypokalemia 12/08/2020   AKI (acute kidney injury) ([facility]) 04/27/2020   Vasculopathy 04/27/2020   Bacteremia 04/19/2020   Addison's disease ([place])    Nausea 04/01/2020   Generalized weakness 02/10/2020   Headache 02/10/2020   Subcutaneous nodule 02/01/2020   Port-A-Cath in place 09/09/2019   Moderate persistent asthma without complication 75/07/2584   Fever 07/21/2019   Superficial thrombophlebitis 07/16/2019   Injury of left index finger 12/24/2018   History of pituitary adenoma 12/23/2018   History of cervical cancer 12/23/2018   Chronic respiratory failure with hypoxia ([facility]) 12/23/2018   SOB (shortness of breath)    Nephrolithiasis 11/07/2018   Oxygen dependent 11/07/2018   Migraine 11/07/2018   PVC's (premature ventricular contractions) 10/27/2018   Right heart failure ([place]) 04/17/2017   Hyperlipidemia 03/09/2014   Hx of Hodgkins lymphoma    History of ductal carcinoma in situ (DCIS) of breast    History of thyroid cancer    Adrenal insufficiency (HCC)    Hypertension    Raynaud phenomenon    GERD (gastroesophageal reflux disease)    PAST MEDICAL HISTORY:  Active Ambulatory Problems    Diagnosis Date Noted   Hx of Hodgkins lymphoma    History of ductal carcinoma in situ (DCIS) of breast    History of thyroid cancer    Adrenal insufficiency ([facility])    Hypertension    Raynaud phenomenon    GERD (gastroesophageal  reflux disease)    Hyperlipidemia 03/09/2014   Right heart failure ([facility]) 04/17/2017   PVC's (premature ventricular contractions) 10/27/2018   Nephrolithiasis 11/07/2018   Oxygen dependent 11/07/2018   Migraine 11/07/2018   SOB (shortness of breath)    History of pituitary adenoma 12/23/2018   History of cervical cancer 12/23/2018   Chronic respiratory failure with hypoxia ([place]) 12/23/2018   Injury of left index finger 12/24/2018   Superficial thrombophlebitis 07/16/2019   Fever 07/21/2019   Moderate persistent asthma without complication 27/78/2423   Port-A-Cath in place 09/09/2019   Subcutaneous nodule 02/01/2020   Generalized weakness 02/10/2020   Headache 02/10/2020   Nausea 04/01/2020   Addison's disease ([place])    Bacteremia 04/19/2020   AKI (acute kidney injury) ([facility]) 04/27/2020   Vasculopathy 04/27/2020   Seizure-like activity ([place]) 12/08/2020   High anion gap metabolic acidosis 53/61/4431   Hypokalemia 12/08/2020   Major depressive disorder, recurrent episode, moderate (HCC)    Generalized anxiety disorder    Bacteremia due to Enterobacter species 03/29/2022   Normocytic anemia 03/29/2022   Anxiety and depression 04/06/2022   GERD without esophagitis 04/06/2022   Sepsis ([facility]) 04/06/2022   PICC (peripherally inserted central catheter) in place    Community acquired pneumonia    Severe persistent asthma with acute exacerbation    Anxiety    Resolved Ambulatory Problems    Diagnosis Date Noted   Tachycardia    Heart palpitations    Chest discomfort  Appendicitis    Edema 11/16/2014   Dizziness 11/16/2014   Non-ischemic cardiomyopathy (HCC)    Cellulitis of right middle finger 11/07/2018   Hemoptysis 11/11/2018   FUO (fever of unknown origin)    HCAP (healthcare-associated pneumonia) 11/15/2018   Pulmonary hypertension ([facility]) 12/23/2018   Necrotizing fasciitis ([facility]) 12/23/2018   Cough variant asthma 04/13/2019   Acute bronchitis with COPD ([facility]) 08/09/2019    Past Medical History:  Diagnosis Date   Anemia    Aortic stenosis    Aortic stenosis    Breast cancer (HCC)    Cervical cancer ([facility]) 12/23/2018   Chest pain    CHF with right heart failure ([facility]) 04/17/2017   Depression    Heart murmur    History of kidney stones    Hodgkin lymphoma (HCC)    Hodgkin's lymphoma (HCC)    Hypoxia    Osteoporosis    Palpitations    Pituitary adenoma ([facility]) 12/23/2018   Pneumonia    PONV (postoperative nausea and vomiting)    Pre-diabetes    Seizures (HCC)    Sleep apnea    Supplemental oxygen dependent    SVT (supraventricular tachycardia)    Thyroid cancer ([facility])    SOCIAL HX:  Social History   Tobacco Use   Smoking status: Never   Smokeless tobacco: Never  Substance Use Topics   Alcohol use: Not Currently    Comment: social    FAMILY HX:  Family History  Family history unknown: Yes      ALLERGIES:  Allergies  Allergen Reactions   Ferrous Bisglycinate Chelate [Iron] Anaphylaxis and Other (See Comments)    Only IV - only FERRLICET   Mushroom Extract Complex Anaphylaxis   Na Ferric Gluc Cplx In Sucrose Anaphylaxis   Cymbalta [Duloxetine Hcl] Swelling and Anxiety   Ondansetron Hcl Other (See Comments)   Promethazine Other (See Comments)    Other reaction(s): Unknown   Promethazine Hcl     Other reaction(s): Unknown   Succinylcholine Other (See Comments)    Lock Jaw   Buprenorphine Hcl Hives   Compazine Other (See Comments)    Altered mental status Aggression   Duloxetine     Other reaction(s): Not available   Metoclopramide Other (See Comments)    Dystonia   Morphine And Related Hives   Ondansetron Hives and Rash    Other reaction(s): Unknown Other reaction(s): Unknown   Promethazine Hcl Hives   Tegaderm Ag Mesh [Silver] Rash    Old formulation only, is able to tolerate new formulation     PERTINENT MEDICATIONS:  Outpatient Encounter Medications as of 07/24/2022  Medication Sig   albuterol (PROVENTIL) (2.5 MG/3ML)  0.083% nebulizer solution Take 3 mLs (2.5 mg total) by nebulization every 6 (six) hours as needed for wheezing or shortness of breath.   amoxicillin-clavulanate (AUGMENTIN) 875-125 MG tablet Take 1 tablet by mouth 2 (two) times daily.   aspirin EC 81 MG tablet Take 1 tablet (81 mg total) by mouth daily. Swallow whole.   bumetanide (BUMEX) 1 MG tablet TAKE 1 TABLET BY MOUTH TWICE A DAY   buPROPion (WELLBUTRIN XL) 150 MG 24 hr tablet TAKE 1 TABLET BY MOUTH EVERY DAY   clonazePAM (KLONOPIN) 0.5 MG tablet Take 1 tablet (0.5 mg total) by mouth 2 (two) times daily as needed for anxiety.   dexlansoprazole (DEXILANT) 60 MG capsule Take 60 mg by mouth daily.   Diclofenac Potassium,Migraine, (CAMBIA) 50 MG PACK Take 50 mg by mouth daily as needed for migraine.  diphenhydrAMINE (BENADRYL) 50 MG/ML injection Inject 25 mg into the muscle once as needed (for nausea).   dronabinol (MARINOL) 2.5 MG capsule Take 1 capsule (2.5 mg total) by mouth 2 (two) times daily as needed (nausea).   EMGALITY 120 MG/ML SOSY Inject 120 mg into the skin every 28 (twenty-eight) days.   EPINEPHrine 0.3 mg/0.3 mL IJ SOAJ injection Inject 0.3 mg into the muscle as needed for anaphylaxis.   escitalopram (LEXAPRO) 20 MG tablet Take 1 tablet (20 mg total) by mouth every evening.   famotidine (PEPCID) 20 MG tablet Take 1 tablet (20 mg total) by mouth at bedtime.   FLORASTOR 250 MG capsule Take 250 mg by mouth See admin instructions. Take 250 mg by mouth mid-morning and mid-afternoon   fluticasone-salmeterol (ADVAIR HFA) 230-21 MCG/ACT inhaler Inhale 2 puffs into the lungs 2 (two) times daily.   guaiFENesin (ROBITUSSIN) 100 MG/5ML liquid Take 15 mLs by mouth every 6 (six) hours as needed for cough or to loosen phlegm.   hydrocortisone (CORTEF) 10 MG tablet Take 1-2 tablets (10-20 mg total) by mouth See admin instructions. Take 20 mg in the am and 56m in the evening. Take after completing prednisone   lamoTRIgine (LAMICTAL) 150 MG tablet  Take one tablet at bedtime.   Lancets (ONETOUCH DELICA PLUS [id number] MISC 3 (three) times daily. for testing   montelukast (SINGULAIR) 10 MG tablet Take 1 tablet (10 mg total) by mouth at bedtime.   ONETOUCH VERIO test strip 3 (three) times daily. for testing   OXYGEN Inhale 3-4 L/min into the lungs continuous.   PROAIR HFA 108 (90 Base) MCG/ACT inhaler Inhale 2 puffs into the lungs every 4 (four) hours as needed for wheezing or shortness of breath.   Rimegepant Sulfate (NURTEC) 75 MG TBDP Take 75 mg by mouth daily as needed (Migraine).    rosuvastatin (CRESTOR) 10 MG tablet Take 10 mg by mouth See admin instructions. Take 10 mg by mouth mid-morning every day   sacubitril-valsartan (ENTRESTO) 24-26 MG Take 1 tablet by mouth 2 (two) times daily.   sitaGLIPtin (JANUVIA) 100 MG tablet Take 1 tablet (100 mg total) by mouth daily.   sucralfate (CARAFATE) 1 GM/10ML suspension Take 1 g by mouth daily as needed (as directed for ulcers).   SYNTHROID 100 MCG tablet Take 1 tablet (100 mcg total) by mouth every morning.   topiramate (TOPAMAX) 50 MG tablet Take 150 mg by mouth at bedtime.    umeclidinium bromide (INCRUSE ELLIPTA) 62.5 MCG/ACT AEPB Inhale 1 puff into the lungs daily.   zolpidem (AMBIEN CR) 12.5 MG CR tablet Take 12.5 mg by mouth at bedtime.   Facility-Administered Encounter Medications as of 07/24/2022  Medication   0.9 %  sodium chloride infusion   Mepolizumab SOLR 100 mg   Thank you for the opportunity to participate in the care of Ms. PRollene White  The palliative care team will continue to follow. Please call our office at [phone] we can be of additional assistance.   Quinten Allerton Z Veryl Winemiller, White ,

## 2022-07-24 NOTE — Telephone Encounter (Signed)
Spoke with [name] from [facility] and clarified that pt needs o2 tanks 4lpm continuous  Nothing further needed

## 2022-07-24 NOTE — Telephone Encounter (Signed)
Jasmine from [facility] calling back for clarification on 02. Please call back at [phone]

## 2022-07-25 ENCOUNTER — Encounter: Payer: Self-pay | Admitting: Nurse Practitioner

## 2022-07-25 ENCOUNTER — Telehealth: Payer: Medicare Other | Admitting: Nurse Practitioner

## 2022-07-25 ENCOUNTER — Encounter: Payer: Self-pay | Admitting: Hematology and Oncology

## 2022-07-25 DIAGNOSIS — Z515 Encounter for palliative care: Secondary | ICD-10-CM

## 2022-07-25 DIAGNOSIS — G8929 Other chronic pain: Secondary | ICD-10-CM

## 2022-07-25 DIAGNOSIS — R5381 Other malaise: Secondary | ICD-10-CM

## 2022-07-25 NOTE — Progress Notes (Signed)
[facility] Consult Note Telephone: [phone]  Fax: [phone]    Date of encounter: 07/25/22 10:14 AM PATIENT NAME: [name] [id number]   [phone] (home)  DOB: 1970/11/22 MRN: [id number] PRIMARY CARE PROVIDER:    Su Grand, MD,  [address] [place] [address] [place] [address] [phone]  RESPONSIBLE PARTY:    Contact Information     Name Relation Home Work Mobile   White,Susan Spouse [phone]  [phone]      I connected with [name] on 07/25/2022 by video enabled telemedicine application and verified that I am speaking with Susan correct person using two identifiers. I discussed Susan limitations of evaluation and management by telemedicine. Susan patient expressed understanding and agreed to proceed  Palliative Care was asked to follow this patient by consultation request of  Toppin, Antionette Poles, MD to address advance care planning and complex medical decision making. This is a follow up visit.  ASSESSMENT AND PLAN / RECOMMENDATIONS:  Symptom Management/Plan: Advance Care Planning: full code; ongoing discussion  Goals of Care: Goals include to maximize quality of life and symptom management. Our advance care planning conversation included a discussion about:    - Susan value and importance of advance care planning  - Exploration of personal, cultural or spiritual beliefs that might influence medical decisions  - Exploration of goals of care in Susan event of a sudden injury or illness  - Identification and preparation of a healthcare agent  - Review and updating or creation of an advance directive document.  3. Chronic pain secondary to debility and multiple co-morbidities: ongoing; re-visited; current pain regime (Voltaren, Biofreeze, and Salonpas) is not effective. Revisited with [name] Susan option of OTC Arnicare cream. Lengthy discussion had with [name] about  alternative therapies. Discussed with [name] option of seeing Susan. Tamala Julian at [facility]. [name] was in agreement. [name] to schedule own appointment. Discussed with patient about having PC nurse and social worker making a home visit for further supportive role. [name] in agreement. Continue to monitor pain scale.  4. Palliative Care Encounter: Palliative medicine team will continue to support patient, patient's family, and medical team. Visit consisted of counseling and education dealing with Susan complex and emotionally intense issues of symptom management and palliative care in Susan setting of serious and potentially life-threatening illness  Follow up Palliative Care Visit: Palliative care will continue to follow for complex medical decision making, advance care planning, and clarification of goals. Return 2 weeks or prn.  I spent 65 minutes providing this consultation. More than 50% of Susan time in this consultation was spent in counseling and care coordination.  PPS: 60% Chief Complaint: Follow-up palliative consult for complex medical decision making, address goals, manage ongoing symptoms  HISTORY OF PRESENT ILLNESS:  [name] is a 51 y.o. 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  I called [name] for follow-up telemedicine visit by video. [name] is tearful during  visit but  cooperative. [name] reports that she "slept well" overnight, but oxygen came off at some point and that she has a slight headache. Lengthy discussion had with [name] about past medical history and ongoing medical issues. [name] informs me about upcoming Echo appointment, cardiac catherization, and sleep study. [name] reports having a "great relationship" with [name] and "fully trusts" [name] with her care. [name] is also very appreciative [name]. Lengthy discussion had with [name] about chronic pain, ros.  [name] reports current medications are not working. 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 She states that she eats an Outshine bar to soothe her mouth and throat before having solid intake. [name] does report that she sees a dietician. Discussion had with [name] about family history and current family dynamics. [name] states that family is a big priority and that some of her family dynamics affect her day to day life. Educated [name] on [name] for her son. [name] in agreement to schedule her son an appointment at [name] for further counseling  as he has been seen there before. [name] endorses that she feels angry and that she is in a constant state of grief due to chronic illnesses and changes in her health. Lengthy discussion had with [name] about coping strategies and positive areas of her life to look forward to. [name] reports that she continues to go to [name] for psychiatry and psychotherapy where her medications are managed. Discussed with [name] about [facility] RN and social worker making a home visit. [name] in agreement. Therapeutic listening and emotional support provided. Questions answered. Discussed with [name] following visit. Reviewed psych notes.   History obtained from review of EMR, discussion with primary team and [name].  I reviewed available labs, medications,  imaging, studies and related documents from Susan EMR.  Records reviewed and summarized above.   ROS: 10 point system reviewed see HPI; +chronic pain, chronic nausea, +fatigue, + chronic nausea Physical Exam (based on observations, limited due to nature of telemedicine): Constitutional: NAD General: chronically ill appearing Cardio: edema present in hands Resp: continuous oxygen in place Neuro:  forgetful Psych: tearful, cooperative, alert and oriented x3 Thank you for Susan opportunity to participate in Susan care of [name].  Susan palliative care team will continue to follow. Please call our office at [phone] if we can be of additional assistance.   Dmonte Maher Ihor Gully, NP

## 2022-07-26 DIAGNOSIS — J9611 Chronic respiratory failure with hypoxia: Secondary | ICD-10-CM | POA: Diagnosis not present

## 2022-07-30 ENCOUNTER — Ambulatory Visit (HOSPITAL_COMMUNITY): Payer: BC Managed Care – PPO | Attending: Cardiovascular Disease

## 2022-07-30 DIAGNOSIS — R0600 Dyspnea, unspecified: Secondary | ICD-10-CM | POA: Insufficient documentation

## 2022-07-30 DIAGNOSIS — I50810 Right heart failure, unspecified: Secondary | ICD-10-CM | POA: Diagnosis not present

## 2022-07-30 LAB — ECHOCARDIOGRAM COMPLETE
AR max vel: 1.47 cm2
AV Area VTI: 1.49 cm2
AV Area mean vel: 1.47 cm2
AV Mean grad: 13.5 mmHg
AV Peak grad: 24.7 mmHg
Ao pk vel: 2.49 m/s
Area-P 1/2: 3.96 cm2
S' Lateral: 1.7 cm

## 2022-07-31 DIAGNOSIS — G8222 Paraplegia, incomplete: Secondary | ICD-10-CM | POA: Diagnosis not present

## 2022-07-31 DIAGNOSIS — R911 Solitary pulmonary nodule: Secondary | ICD-10-CM | POA: Diagnosis not present

## 2022-08-01 ENCOUNTER — Ambulatory Visit (HOSPITAL_BASED_OUTPATIENT_CLINIC_OR_DEPARTMENT_OTHER): Payer: BC Managed Care – PPO | Attending: Student | Admitting: Pulmonary Disease

## 2022-08-01 DIAGNOSIS — G4736 Sleep related hypoventilation in conditions classified elsewhere: Secondary | ICD-10-CM | POA: Insufficient documentation

## 2022-08-01 DIAGNOSIS — G478 Other sleep disorders: Secondary | ICD-10-CM | POA: Diagnosis not present

## 2022-08-01 DIAGNOSIS — G4719 Other hypersomnia: Secondary | ICD-10-CM | POA: Insufficient documentation

## 2022-08-02 NOTE — Telephone Encounter (Signed)
Dr. Loanne Drilling, please advise if you have any other info we might be able to put to see if we can get the vest approved for pt.

## 2022-08-02 NOTE — Telephone Encounter (Signed)
What additional information is needed?  Form was submitted by NP Volanda Napoleon and recent documentation as well Please ask case manager what we are missing in order to get this for our patient.

## 2022-08-03 NOTE — Telephone Encounter (Signed)
I do not think patient meets qualifications for smart vest.  She does not have a diagnosis of bronchiectasis with daily productive cough for the last 6 months. I believe this pneumonia was her first respiratory infection this year requiring antibiotics. She has bronchitic changes on CXR, I would get HRCT to assess for bronchiectasis.   She should take mucinex 1281m twice daily and use flutter valve.  We can try hypertonic saline nebulizers if she has not yet tried those if she still has chest congestion.cough  FU with Dr. ELoanne Drillingon 08/15/22

## 2022-08-04 ENCOUNTER — Other Ambulatory Visit: Payer: Self-pay | Admitting: Pulmonary Disease

## 2022-08-05 ENCOUNTER — Other Ambulatory Visit: Payer: Self-pay | Admitting: Hematology and Oncology

## 2022-08-06 ENCOUNTER — Other Ambulatory Visit: Payer: Self-pay | Admitting: Hematology and Oncology

## 2022-08-06 NOTE — Telephone Encounter (Signed)
ATC Almyra Free to update. LVMTCB x 1

## 2022-08-07 ENCOUNTER — Ambulatory Visit: Payer: BC Managed Care – PPO | Admitting: Psychiatry

## 2022-08-07 ENCOUNTER — Telehealth: Payer: Self-pay | Admitting: Pulmonary Disease

## 2022-08-07 ENCOUNTER — Other Ambulatory Visit: Payer: Self-pay | Admitting: Hematology and Oncology

## 2022-08-07 DIAGNOSIS — F411 Generalized anxiety disorder: Secondary | ICD-10-CM

## 2022-08-07 DIAGNOSIS — J9611 Chronic respiratory failure with hypoxia: Secondary | ICD-10-CM

## 2022-08-07 DIAGNOSIS — J189 Pneumonia, unspecified organism: Secondary | ICD-10-CM

## 2022-08-07 DIAGNOSIS — J4551 Severe persistent asthma with (acute) exacerbation: Secondary | ICD-10-CM

## 2022-08-07 NOTE — Telephone Encounter (Signed)
Call made to patient, confirmed DOB. Made aware provider wanted to know if she still has a cough and if so wanted to move forward with some additional imaging. Pt unable to complete a sentence while on phone without coughing, she state she still has a cough and tessalon only gives her temporary relief, she does want to move forward with additional imaging.   Order placed. Aware someone from our office will contact her to schedule. Voiced understanding.   Will route to provider as FYI.

## 2022-08-07 NOTE — Telephone Encounter (Signed)
ATC x1. LVM to return call.

## 2022-08-07 NOTE — Telephone Encounter (Signed)
LMTCB

## 2022-08-07 NOTE — Telephone Encounter (Signed)
Call made to patient, confirmed DOB. Made aware provider wanted to know if she still has a cough and if so wanted to move forward with some additional imaging. Pt unable to complete a sentence while on phone without coughing, she state she still has a cough and tessalon only gives her temporary relief, she does want to move forward with additional imaging.    Order placed. Aware someone from our office will contact her to schedule. Voiced understanding.

## 2022-08-07 NOTE — Telephone Encounter (Signed)
Beth, Do you want [place] to order a HRCT scan?  I do not see where she has been on hypertonic nebulizer solution.  Please advise if you want this sent in as well.  Thank you.

## 2022-08-07 NOTE — Telephone Encounter (Signed)
Called and spoke with Adonis Huguenin with [employer], she states she spoke with [facility] about this patient last week when she was in the office and has discontinued the order.  She said she will be glad to reactivate the order should we determine she does have bronchiectasis after having a HRCT scan.  Nothing further needed.

## 2022-08-07 NOTE — Progress Notes (Signed)
[facility] Counselor/Therapist Progress Note  Patient ID: [name], MRN: [id number],    Date: 08/07/2022  Time Spent: 50 minutes   Treatment Type: Individual Therapy   Virtual Visit via Telehealth Note : MyChart Video session Connected with patient by a telemedicine/telehealth application, with their informed consent, and verified patient privacy and that I am speaking with the correct person using two identifiers. I discussed the limitations, risks, security and privacy concerns of performing psychotherapy and the availability of in person appointments. I also discussed with the patient that there may be a patient responsible charge related to this service. The patient expressed understanding and agreed to proceed. I discussed the treatment planning with the patient. The patient was provided an opportunity to ask questions and all were answered. The patient agreed with the plan and demonstrated an understanding of the instructions. The patient was advised to call  our office if  symptoms worsen or feel they are in a crisis state and need immediate contact.   Therapist Location: office Patient Location: home   Reported Symptoms: anxiety  Mental Status Exam:   Appearance:   Casual     Behavior:  Appropriate, Sharing, and Motivated  Motor:  Normal  Speech/Language:   Clear and Coherent  Affect:  anxious    Mood:  anxious some anger  Thought process:  goal directed  Thought content:    Rumination  Sensory/Perceptual disturbances:    WNL  Orientation:  oriented to person, place, time/date, situation, day of week, month of year, year, and stated date of Nov. 7, 2023  Attention:  Good  Concentration:  Good  Memory:  WNL  Fund of knowledge:   Good  Insight:    Good and Fair  Judgment:   Good  Impulse Control:  Good   Risk Assessment: Danger to Self:  No Self-injurious Behavior: No Danger to Others: No Duty to Warn:no Physical Aggression / Violence:No  Access to  Firearms a concern: No  Gang Involvement:No   Subjective:  Patient struggle with anxiety, anger, and "why's" related to her ongoing issues with "failure of heart and lungs in addition to renal failure" . "Have now been placed on palliative care through hospice." "Angry and irritated that so many things are popping up now that I used to be able to do and now I cannot do those things because of my illness and getting worse."  "Not as depressed". Does still feel useless, and hurt by some comments within family that was hurtful. Shares some comments from her 28 yr old son "who is on the spectrum and made a couple of hurtful comments to patient." Reports anger about her situation and her illness and processes this more today in session which really seemed to be her main need at this point.  Working to let go of self-blaming. Neighbors and husband remain supportive.    Interventions: Cognitive Behavioral Therapy and Ego-Supportive  Treatment goals: (updated 08/07/2022 as patient has experienced some changes in physical challenges) Treatment goals remain on treatment plan as patient works with strategies to achieve her goals.  Progress is assessed each session and documented in the "subject" and/or "plan" section of treatment note. Long-term goal: This goal is not longer applicable for patient due to her decline in physical health.  Will be updating this goal and possibly others upon review at next session.  Patient will confront her anxiety, fears, anger reported re: her illness and use time in therapy and beyond to work  on this, while receiving feedback and support from therapist. Help her determine what she feels is realistic for her considering her health challenges and difficult prognosis.  Short-term goal: Participate in therapy and initiate communication of needs and desires at this point in her struggle physically.  Strategies: Share and talk through anxiety, anger, and depression that are experienced  and work to find and use helpful coping tools to better manage those feelings.   Diagnosis:   ICD-10-CM   1. Generalized anxiety disorder  F41.1      Plan: Patient today actively participating and showing good motivation especially considering her current health struggles.  Did some updating and tweaking of her treatment goal plan with patient.  Worked with her in session encouraging her open expression of her anxieties, anger, fearful thoughts, and depression, to which patient responded favorably and talked at length in session, "venting and processing how things are feeling for her."  Continues to deny any SI.  Feels her and her husband's relationship has continued to improve.  Reports decreased family dysfunction.  Brother continues to be her "best friend" and confidant.  Less self blaming for things within the family.  Trying to accept her limitations more and be open to others be involved in helping as needed. Encouraged patient and her focus on goal-directed behaviors and physical challenges, especially in her own self-care, freely asking for help whenever she needs it.  She reports that her physical status has declined some more and that "they are treating symptoms knowing that they really cannot cure her situation".  Wanted to schedule II more appointments into the future and we did that at the end of session today.  Goal review and progress/challenges noted with patient.  Next appointment within 2 to 3 weeks per patient request.  This record has been created using [employer].  Chart creation errors have been sought, but may not always have been located and corrected.  Such creation errors do not reflect on the standard of medical care provided.   Shanon Ace, LCSW

## 2022-08-07 NOTE — Telephone Encounter (Signed)
If patient still has chronic cough then yes we can go ahead and order HRCT, I would like it done before her apt with Dr. Loanne Drilling on 11/15 if possible

## 2022-08-08 ENCOUNTER — Encounter: Payer: Self-pay | Admitting: Nurse Practitioner

## 2022-08-08 ENCOUNTER — Telehealth: Payer: Medicare Other | Admitting: Nurse Practitioner

## 2022-08-10 ENCOUNTER — Telehealth: Payer: Self-pay | Admitting: Adult Health

## 2022-08-10 NOTE — Telephone Encounter (Signed)
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

## 2022-08-10 NOTE — Telephone Encounter (Signed)
Patient lvm stating that after appt with Jackelyn Poling it was discussed that she should inquire about upping dosage of Clonazepam. She is currently on 0.3m.  Need rtc to discuss Ph: [phone]

## 2022-08-10 NOTE — Telephone Encounter (Signed)
When we spoke she said on 2 separate occasions she took more then prescribed.Filled 10/19

## 2022-08-10 NOTE — Telephone Encounter (Signed)
When did she fill it last and how often is she taking more than the prescribed amount?

## 2022-08-11 ENCOUNTER — Encounter: Payer: Self-pay | Admitting: Cardiovascular Disease

## 2022-08-13 ENCOUNTER — Other Ambulatory Visit: Payer: Self-pay | Admitting: Hematology and Oncology

## 2022-08-13 ENCOUNTER — Encounter: Payer: Self-pay | Admitting: Cardiovascular Disease

## 2022-08-13 ENCOUNTER — Encounter: Payer: Self-pay | Admitting: Internal Medicine

## 2022-08-13 ENCOUNTER — Ambulatory Visit
Admission: RE | Admit: 2022-08-13 | Discharge: 2022-08-13 | Disposition: A | Discharge status: Home / Self Care | Payer: BC Managed Care – PPO | Source: Ambulatory Visit | Attending: Primary Care | Admitting: Primary Care

## 2022-08-13 ENCOUNTER — Other Ambulatory Visit: Payer: Self-pay

## 2022-08-13 ENCOUNTER — Ambulatory Visit: Payer: BC Managed Care – PPO | Admitting: Internal Medicine

## 2022-08-13 DIAGNOSIS — Z8572 Personal history of non-Hodgkin lymphomas: Secondary | ICD-10-CM | POA: Diagnosis not present

## 2022-08-13 DIAGNOSIS — J90 Pleural effusion, not elsewhere classified: Secondary | ICD-10-CM | POA: Diagnosis not present

## 2022-08-13 DIAGNOSIS — J4551 Severe persistent asthma with (acute) exacerbation: Secondary | ICD-10-CM

## 2022-08-13 DIAGNOSIS — F411 Generalized anxiety disorder: Secondary | ICD-10-CM

## 2022-08-13 DIAGNOSIS — R918 Other nonspecific abnormal finding of lung field: Secondary | ICD-10-CM | POA: Diagnosis not present

## 2022-08-13 DIAGNOSIS — J189 Pneumonia, unspecified organism: Secondary | ICD-10-CM

## 2022-08-13 DIAGNOSIS — J9611 Chronic respiratory failure with hypoxia: Secondary | ICD-10-CM

## 2022-08-13 DIAGNOSIS — Z853 Personal history of malignant neoplasm of breast: Secondary | ICD-10-CM | POA: Diagnosis not present

## 2022-08-13 MED ORDER — CLONAZEPAM 0.5 MG PO TABS: 0.5000 mg | ORAL_TABLET | Freq: Three times a day (TID) | ORAL | 2 refills | Status: DC | PRN

## 2022-08-13 MED ORDER — BUMETANIDE 1 MG PO TABS: 1.0000 mg | ORAL_TABLET | Freq: Two times a day (BID) | ORAL | 0 refills | Status: DC

## 2022-08-13 NOTE — Telephone Encounter (Signed)
Pt informed

## 2022-08-13 NOTE — Telephone Encounter (Signed)
Lets increase by one tablet daily.

## 2022-08-13 NOTE — Progress Notes (Signed)
    This encounter was created in error - please disregard.

## 2022-08-14 ENCOUNTER — Encounter: Payer: Self-pay | Admitting: Cardiovascular Disease

## 2022-08-14 ENCOUNTER — Encounter: Payer: BC Managed Care – PPO | Admitting: Cardiovascular Disease

## 2022-08-15 ENCOUNTER — Other Ambulatory Visit (HOSPITAL_COMMUNITY): Payer: Self-pay

## 2022-08-15 ENCOUNTER — Telehealth (HOSPITAL_BASED_OUTPATIENT_CLINIC_OR_DEPARTMENT_OTHER): Payer: Self-pay

## 2022-08-15 ENCOUNTER — Ambulatory Visit (INDEPENDENT_AMBULATORY_CARE_PROVIDER_SITE_OTHER): Payer: BC Managed Care – PPO | Admitting: Pulmonary Disease

## 2022-08-15 ENCOUNTER — Encounter (HOSPITAL_BASED_OUTPATIENT_CLINIC_OR_DEPARTMENT_OTHER): Payer: Self-pay | Admitting: Pulmonary Disease

## 2022-08-15 VITALS — BP 102/60 | HR 114 | Wt 181.0 lb

## 2022-08-15 DIAGNOSIS — J986 Disorders of diaphragm: Secondary | ICD-10-CM

## 2022-08-15 DIAGNOSIS — J4540 Moderate persistent asthma, uncomplicated: Secondary | ICD-10-CM

## 2022-08-15 DIAGNOSIS — J42 Unspecified chronic bronchitis: Secondary | ICD-10-CM

## 2022-08-15 MED ORDER — FLUTICASONE-SALMETEROL 230-21 MCG/ACT IN AERO: INHALATION_SPRAY | RESPIRATORY_TRACT | 5 refills | Status: DC

## 2022-08-15 MED ORDER — SPIRIVA RESPIMAT 1.25 MCG/ACT IN AERS: 2.0000 | INHALATION_SPRAY | Freq: Every day | RESPIRATORY_TRACT | 5 refills | Status: DC

## 2022-08-15 MED ORDER — SPIRIVA RESPIMAT 1.25 MCG/ACT IN AERS: 2.0000 | INHALATION_SPRAY | Freq: Every day | RESPIRATORY_TRACT | 0 refills | Status: DC

## 2022-08-15 NOTE — Patient Instructions (Addendum)
Moderate persistent eosinophilic asthma REFILL Advair 230-21 mcg TWO puff TWICE a day CONTINUE Albuterol AS NEEDED for shortness of breath or wheezing  Chronic hypoxemic respiratory failure Currently wearing 4L at home CONTINUE supplemental oxygen for goal greater than >88% Try to give yourself a break on lower oxygen levels as long as it is above 88%.  Right heart failure Anasarca Diuresis per Cardiology/Heart failure team  Follow-up with me in 2 months

## 2022-08-15 NOTE — Progress Notes (Addendum)
Subjective:   PATIENT ID: [name] GENDER: female DOB: 1971-03-24, MRN: [id number]   HPI  Chief Complaint  Patient presents with   Follow-up    Incruse not covered under insurance   Reason for Visit: Follow-up  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  Synopsis: Previously followed at [facility] for cough variant asthma, subcentimeter lung nodules (stable) and chronic hypoxemia of unknown etiology. Patient had previously had concerns for pulmonary fibrosis however after no evidence of parenchymal disease on chest imaging. 2020 - Initially consulted with [facility] for hemoptysis. Work-up including bronchoscopy and CTA were negative and she has not had significant recurrence since then except mild episodes during exacerbations. Started on Nucala in 09/2019 for asthma however lost to follow-up from December 2020 to April 2021 2021 - Multiple hospitalizations including for kidney stone pain, migraine, bacteremia 2/2 port infection, LUE cellulitis 2022 - Re-established care and scheduled to start Nucala however unable to start. Had COVID and received antibody infusion  02/02/22 She was previously on Breo and Qvar. Compliant with her oxygen at 3-4L. She has dyspnea on exertion. She uses albuterol twice a day. We had attempted to restart Nucala last year but unfortunately she had COVID in September and other medical conditions that prevented her from returning to clinic including  port infection. Port has been replaced in March 2023. She has had decreased appetite due to lost/alteration of taste from COVID.  08/15/22 Since our last visit she has had 3 hospital admissions including line associated bacteremia requiring port removal, readmission with sepsis, AKI and recently pneumonia in October/last month. Chest vest has been arranged for her and she has received communication from company but not yet received the actual vest. She continues to have daily productive cough with yellowish sputum. Wheezing not reported. Rare events with blood mixed in with mucous. She has been seen in outpatient Palliative care and confirms her DNR/DNI status with me. Her chronic cardiac and lung issues have been depressing her and she is aware that medical management has its limitations. She is compliant with her Advair and Incruse but reports Incruse is too costly. On 4L O2 which was her previous usage. She is following behavior health and counseling and feels this is helpful.  Social History: Has two adult daughters 23 for her 51 yeard old son with atypical cerebral palsy  Environmental exposures:  Hx chemotherapy  CareEverywhere Records Reviewed and Summarized: Pulm at [facility]      09/18/17 Bronchoscopy with BAL. Negative cultures and negative cytology.      Dr. Geraldo Docker documented on 01/31/18 that patient's chest imaging was presented in 10/2017 and her bronchial fibroelastotic scar was felt to be a nonspecific injury related to reflux or prior XRT and not related to her chronic cough. 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  recurrence. Currently has monthly port flushes.      The note also comments on PCP work-up comments on patient reporting hematuria  with UA showing 1+ blood however this is not seen on recent PCP note.  IM at [facility] -       Per  PCP note on 03/17/19, patient previously seen by Cardiology  for CHF with right heart failure and pH with EF 35% however EF normalized on echo 11/2018  Past Medical History:  Diagnosis Date   Addison's disease [facility])    Adrenal insufficiency ([place])    Anemia    Anxiety    Aortic stenosis    Aortic stenosis    Appendicitis 12/19/09   Appendicitis    Breast cancer (HCC)    STATUS POST BILATERAL MASTECTOMY. STATUS POST RECONSTRUCTION. SHE HAD SILICONE BREAST IMPLANTS AND THE LEFT IMPLANT IS LEAKING SLIGHTLY   Cellulitis of right middle finger 11/07/2018   Cervical cancer ([place]) 12/23/2018   Chest pain    CHF with right heart failure ([place]) 04/17/2017   Chronic respiratory failure with hypoxia (HCC) 12/23/2018   Cough variant asthma 04/13/2019   Depression    GERD (gastroesophageal reflux disease)    takes Dexilant and carafate and gi coctail    Headache    migraines on a daily and monthly regimen    Heart murmur    History of kidney stones    Hodgkin lymphoma ([place])    STATUS POST MANTLE RADIATION   Hodgkin's lymphoma ([place])    1987   Hypertension    Hypoxia    Necrotizing fasciitis ([place]) 12/23/2018   Non-ischemic cardiomyopathy ([place])    Osteoporosis    Palpitations    Pituitary adenoma ([place]) 12/23/2018   Pneumonia    PONV (postoperative nausea and vomiting)    Pre-diabetes    per pt; no meds   Pulmonary hypertension ([place]) 12/23/2018   Raynaud phenomenon    Right heart failure ([place]) 04/17/2017   Seizures ([place])    last febrile seizure was approx 3 weeks ago per report on 12/01/2020   Sleep apnea    upcoming sleep study per pt    Supplemental oxygen dependent    3 liters   SVT (supraventricular tachycardia)    Tachycardia    Thyroid cancer ([place])    STATUS POST SURGICAL REMOVAL-CURRENT ON THYROID REPLACEMENT     Allergies  Allergen Reactions   Ferrous Bisglycinate Chelate [Iron] Anaphylaxis and Other (See Comments)    Only IV - only FERRLICET   Mushroom Extract Complex Anaphylaxis   Na Ferric Gluc Cplx In Sucrose Anaphylaxis   Cymbalta  [Duloxetine Hcl] Swelling and Anxiety   Ondansetron Hcl Other (See Comments)   Promethazine Other (See Comments)    Other reaction(s): Unknown   Promethazine Hcl     Other reaction(s): Unknown   Succinylcholine Other (See Comments)    Lock Jaw   Buprenorphine Hcl Hives   Compazine Other (See Comments)    Altered mental status Aggression   Duloxetine     Other reaction(s): Not available   Metoclopramide Other (See Comments)    Dystonia   Morphine And Related Hives   Ondansetron Hives and Rash    Other reaction(s): Unknown Other reaction(s): Unknown   Promethazine Hcl Hives   Tegaderm Ag Mesh [Silver] Rash    Old formulation only, is able to tolerate new formulation     Outpatient Medications Prior to Visit  Medication Sig Dispense Refill   albuterol (PROVENTIL) (2.5 MG/3ML) 0.083% nebulizer solution Take 3 mLs (2.5 mg total) by nebulization every 6 (six) hours as needed for wheezing or shortness of breath. 360 mL 2  aspirin EC 81 MG tablet Take 1 tablet (81 mg total) by mouth daily. Swallow whole. 90 tablet 3   bumetanide (BUMEX) 1 MG tablet Take 1 tablet (1 mg total) by mouth 2 (two) times daily. 60 tablet 0   buPROPion (WELLBUTRIN XL) 150 MG 24 hr tablet TAKE 1 TABLET BY MOUTH EVERY DAY 90 tablet 0   clonazePAM (KLONOPIN) 0.5 MG tablet Take 1 tablet (0.5 mg total) by mouth 3 (three) times daily as needed for anxiety. 90 tablet 2   dexlansoprazole (DEXILANT) 60 MG capsule Take 60 mg by mouth daily.     Diclofenac Potassium,Migraine, (CAMBIA) 50 MG PACK Take 50 mg by mouth daily as needed for migraine.     diphenhydrAMINE (BENADRYL) 50 MG/ML injection Inject 25 mg into the muscle once as needed (for nausea).     dronabinol (MARINOL) 2.5 MG capsule TAKE 1 CAPSULE (2.5 MG TOTAL) BY MOUTH 2 (TWO) TIMES DAILY AS NEEDED (NAUSEA). 30 capsule 0   EPINEPHrine 0.3 mg/0.3 mL IJ SOAJ injection Inject 0.3 mg into the muscle as needed for anaphylaxis. 2 each 2   escitalopram (LEXAPRO) 20 MG  tablet Take 1 tablet (20 mg total) by mouth every evening. 90 tablet 3   FLORASTOR 250 MG capsule Take 250 mg by mouth See admin instructions. Take 250 mg by mouth mid-morning and mid-afternoon     guaiFENesin (ROBITUSSIN) 100 MG/5ML liquid Take 15 mLs by mouth every 6 (six) hours as needed for cough or to loosen phlegm. 120 mL 0   hydrocortisone (CORTEF) 10 MG tablet Take 1-2 tablets (10-20 mg total) by mouth See admin instructions. Take 20 mg in the am and 22m in the evening. Take after completing prednisone     lamoTRIgine (LAMICTAL) 150 MG tablet Take one tablet at bedtime. 90 tablet 1   Lancets (ONETOUCH DELICA PLUS [id number] MISC 3 (three) times daily. for testing     montelukast (SINGULAIR) 10 MG tablet Take 1 tablet (10 mg total) by mouth at bedtime. 30 tablet 2   ONETOUCH VERIO test strip 3 (three) times daily. for testing     OXYGEN Inhale 3-4 L/min into the lungs continuous.     PROAIR HFA 108 (90 Base) MCG/ACT inhaler Inhale 2 puffs into the lungs every 4 (four) hours as needed for wheezing or shortness of breath.     Rimegepant Sulfate (NURTEC) 75 MG TBDP Take 75 mg by mouth daily as needed (Migraine).      rosuvastatin (CRESTOR) 10 MG tablet Take 10 mg by mouth See admin instructions. Take 10 mg by mouth mid-morning every day     sacubitril-valsartan (ENTRESTO) 24-26 MG Take 1 tablet by mouth 2 (two) times daily. 60 tablet 11   sitaGLIPtin (JANUVIA) 100 MG tablet Take 1 tablet (100 mg total) by mouth daily.     sucralfate (CARAFATE) 1 GM/10ML suspension Take 1 g by mouth daily as needed (as directed for ulcers).     SYNTHROID 100 MCG tablet Take 1 tablet (100 mcg total) by mouth every morning.     topiramate (TOPAMAX) 50 MG tablet Take 150 mg by mouth at bedtime.      umeclidinium bromide (INCRUSE ELLIPTA) 62.5 MCG/ACT AEPB Inhale 1 puff into the lungs daily. 30 each 2   zolpidem (AMBIEN CR) 12.5 MG CR tablet Take 12.5 mg by mouth at bedtime.     fluticasone-salmeterol (ADVAIR HFA)  230-21 MCG/ACT inhaler INHALE 2 PUFFS INTO THE LUNGS TWICE A DAY 12 each 5   amoxicillin-clavulanate (  AUGMENTIN) 875-125 MG tablet Take 1 tablet by mouth 2 (two) times daily. (Patient not taking: Reported on 08/15/2022) 10 tablet 0   EMGALITY 120 MG/ML SOSY Inject 120 mg into the skin every 28 (twenty-eight) days. (Patient not taking: Reported on 08/15/2022)     famotidine (PEPCID) 20 MG tablet Take 1 tablet (20 mg total) by mouth at bedtime. (Patient not taking: Reported on 08/15/2022) 30 tablet 2   Facility-Administered Medications Prior to Visit  Medication Dose Route Frequency Provider Last Rate Last Admin   0.9 %  sodium chloride infusion   Intravenous PRN Margaretha Seeds, MD       Mepolizumab SOLR 100 mg  100 mg Subcutaneous Q28 days Margaretha Seeds, MD   100 mg at 08/31/19 1017    Review of Systems  Constitutional:  Positive for malaise/fatigue. Negative for chills, diaphoresis, fever and weight loss.  HENT:  Negative for congestion.   Respiratory:  Positive for cough, sputum production and shortness of breath. Negative for hemoptysis and wheezing.   Cardiovascular:  Negative for chest pain, palpitations and leg swelling.       Upper extremity swelling    Objective:   Vitals:   08/15/22 0944  BP: 102/60  Pulse: (!) 114  SpO2: 100%  Weight: 181 lb (82.1 kg)   SpO2: 100 % (4L) O2 Device: Nasal cannula O2 Flow Rate (L/min): 4 L/min O2 Type: Continuous O2  Physical Exam: General: Well-appearing, no acute distress HENT: [place], AT Eyes: EOMI, no scleral icterus Respiratory: Clear to auscultation bilaterally.  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 No infiltrate, edema or effusion. CT 05/05/09 - Normal parenchymal. Scattered tiny  pulmonary nodules with largest 71m in LUL likely benign CT Chest 09/05/17 (report only): Stable bilateral pulmonary nodules up to 458m(LUL 68m18mLUL 2 mm, RLL subpleural 4mm468mredemonstration of tiny right diaphragmatic hernia containing fat CT Chest 11/10/18 - RML 5 mm lung nodule, unchanged CT Chest HR 04/04/20 - Mild air trapping. Air trapping on expiration. No ILD.  PFT: 04/16/19 FVC 2.42 (65%) FEV1 2.03 (68%) Ratio 76  TLC 84% DLCO 75% Interpretation: Moderate reversible obstructive defect. Significant BD response in FEV1.  CPET Per [facility] records  CPET 03/2016 showed functional impairment with VO2max768m% predicted. Normal oxygenation throughout on room air with post-exercise PaO2=125. HR 82% predicted. Abnormal FEV1 but ventilatory reserve remained. Conclusion: Cardiovascular limitation to exercise possibly reflecting deconditioning. Ventilatory impairment present but not exercise-limiting.  6 min walk  Per [facility] records (12/07/16): SpO2 started at 97-98% and decreased to ~94% with ambulation. There was one episode of symptomatic dizziness during which SpO2 was 88% but with questionable tracing. Recovered to high-90s with ~10-15 seconds of standing rest break.  [facility] 08/13/19  Unable to complete 6MWT. Patient had episode of coughing while walking and dizziness resulting in witnessed fall. Staff reported patient was disoriented and confused.  POC glucose was 109. Pulse oximetry >96% and tachycardia 110. No desaturations documented during this time.  Ambulatory O2 Titration 04/13/19 - Patient unable to complete due to dizziness with ambulation. No desaturations documented. SpO2>95% in the time titration was attempted on room air. Per nursing, patient anxious and holding breath during testing which likely contributed to early termination of test  Labs:  ANA Screen 07/03/17 -  Positive  Feb 2020 ANA - neg SSA/SSB - neg Anti-DNA ab -neg MPO/PR3 ab - ng GBM ab -  neg Anti-scleroderma ab - neg APLAS - neg ANCA titers - neg RF mildly elevated 18.6  Bronchoscopy: 11/19/18   11/19/18 TBFNA station 7 (mislabeled as bronchial brushing) - negative for malignant cells  11/19/18 Respiratory/fungal, AFB from BAL of L lingula - negative    Assessment & Plan:   Discussion: 51 year old female with complex medical history as noted below. Previously followed at [facility] for cough variant asthma, subcentimeter lung nodules (stable) and chronic hypoxemic of unknown etiology (normal lung parenchyma by biopsy and chest imaging).  She was initially established with [facility] for hemoptysis. Work-up including bronchoscopy and CTA were negative and she has not had significant recurrence since then except mild episodes during exacerbations. For her asthma she was started on Nucala in 09/2019. Lost to follow-up and re-established care in 2022. Attempted to restart Nucala however she had medical issues preventing restart.  Recent multiple hospitalizations including pneumonia in October 2023. Reviewed hospital courses. Remains symptomatic. Fortunately O2 requirement stable with 100% on 4L.  We discussed overall goals of care with her chronic co-morbidities and will optimize lung health from a pulmonary standpoint. Will consider transitioning to nebulizers since this may been helpful in the hospital if she fails her current regimen  Moderate persistent eosinophilic asthma, persistent symptoms Chronic bronchitis, failed flutter valve and 3% saline, improved on chest vest Probable diaphragm weakness in setting of deconditioning related to uncontrolled lung diseae - poor inspiratory and expiratory effort with persistent air trapping on imaging despite current bronchodilator therapy. Likely contributes to her inability to clear secretions well REFILL Advair 230-21 mcg TWO puff TWICE a day ORDER Spiriva 1.25 mcg TWO puffs a day. This will replace Incruse CONTINUE Albuterol AS  NEEDED for shortness of breath or wheezing RE-ORDER chest vest  Chronic hypoxemic respiratory failure Currently wearing 4L at home CONTINUE supplemental oxygen for goal greater than >88% Try to give yourself a break on lower oxygen levels as long as it is above 88%.  Right heart failure Anasarca Diuresis per Cardiology/Heart failure team  DNR/DNI Discussed plan regarding event of cardiopulmonary event requiring treatment. OK for family to call EMS but they need to be able communicate her code status at time of event  Health Maintenance Immunization History  Administered Date(s) Administered   Fluad Quad(high Dose 65+) 06/30/2020   Influenza, High Dose Seasonal PF 06/19/2019   Influenza, Quadrivalent, Recombinant, Inj, Pf 08/13/2018   Influenza, Seasonal, Injecte, Preservative Fre 06/23/2016, 11/12/2017, 08/13/2018   Influenza,inj,Quad PF,6+ Mos 07/04/2015, 07/04/2015, 06/23/2016, 07/03/2017, 11/12/2017, 08/13/2018   Influenza,inj,Quad PF,6-35 Mos 07/04/2015   Influenza,inj,quad, With Preservative 06/19/2019   Influenza-Unspecified 07/23/2007, 07/06/2008, 06/13/2009, 07/06/2010, 07/25/2011, 08/01/2012, 07/14/2013, 07/04/2015, 07/04/2015, 06/23/2016, 06/23/2016, 07/03/2017, 11/12/2017, 06/19/2019, 06/30/2020, 06/01/2021   Moderna Sars-Covid-2 Vaccination 12/15/2019, 01/12/2020   Pneumococcal Conjugate PCV 7 03/20/2019   Pneumococcal Conjugate-13 03/20/2019   Pneumococcal Polysaccharide-23 02/21/2017   Pneumococcal-Unspecified 02/21/2017   Td 12/19/2010   Td (Adult), 2 Lf Tetanus Toxid, Preservative Free 12/19/2010   Varicella 04/02/2007   CT Lung Screen - not qualified. Never smoker  No orders of the defined types were placed in this encounter.  Meds ordered this encounter  Medications   fluticasone-salmeterol (ADVAIR HFA) 230-21 MCG/ACT inhaler    Sig: INHALE 2 PUFFS INTO THE LUNGS TWICE A DAY    Dispense:  12 each    Refill:  5   DISCONTD: Tiotropium Bromide Monohydrate  (  SPIRIVA RESPIMAT) 1.25 MCG/ACT AERS    Sig: Inhale 2 puffs into the lungs daily.    Dispense:  4 g    Refill:  0   Tiotropium Bromide Monohydrate (SPIRIVA RESPIMAT) 1.25 MCG/ACT AERS    Sig: Inhale 2 puffs into the lungs daily.    Dispense:  4 g    Refill:  5   Return in about 2 months (around 10/15/2022).  I have spent a total time of 35-minutes on the day of the appointment including chart review, data review, collecting history, coordinating care and discussing medical diagnosis and plan with the patient/family. Past medical history, allergies, medications were reviewed. Pertinent imaging, labs and tests included in this note have been reviewed and interpreted independently by me.   [place], MD [facility] 08/15/2022 11:11 AM  Office Number [phone]

## 2022-08-15 NOTE — Telephone Encounter (Signed)
Per benefits investigation Spiriva 1.63mg/act is covered by the patients insurance at this time.

## 2022-08-15 NOTE — Telephone Encounter (Signed)
Patient would like to know if Spiriva 1.25 is approved by her insurance

## 2022-08-17 ENCOUNTER — Encounter (HOSPITAL_BASED_OUTPATIENT_CLINIC_OR_DEPARTMENT_OTHER): Payer: Self-pay | Admitting: Pulmonary Disease

## 2022-08-17 ENCOUNTER — Telehealth: Payer: Self-pay | Admitting: Pulmonary Disease

## 2022-08-17 NOTE — Telephone Encounter (Signed)
Call patient  Sleep study result  Date of study: 08/01/2022  Impression: Negative for significant obstructive sleep apnea Evidence of upper airway resistance  Recommendation:  Since study does not indicate significant sleep apnea and insurance will not cover CPAP for UARS-upper airway resistance syndrome  Options of treatment may include CPAP therapy, and oral device-fashion by dentist, surgical options to evaluate and treat nasal passage and airway narrowing, aggressive weight loss efforts may help  Follow-up as previously scheduled  Upper airway narrowing can contribute to nonrestorative sleep and daytime fatigue

## 2022-08-17 NOTE — Procedures (Signed)
POLYSOMNOGRAPHY  Last, First: [name], [name] MRN: [id number] Gender: Female Age (years): 88 Weight (lbs): 179 DOB: 1971/07/08 BMI: 30 Primary Care: No PCP Epworth Score: 6 Referring: Maryjane Hurter Technician: Carolin Coy Interpreting: Laurin Coder MD Study Type: NPSG Ordered Study Type: NPSG Study date: 08/01/2022 Location: [place] CLINICAL INFORMATION [name] is a 51 year old Female and was referred to the sleep center for evaluation of G47.80 Other Sleep Disorders. Indications include Daytime Fatigue, Depression, Fatigue, Hypertension, Snoring.  MEDICATIONS Patient self administered medications include: ambien cr, DRONABINOL, topomax, LAMICTAL, benadryl injection, ASPIRIN. Medications administered during study include Sleep medicine administered - Ambien CR at 8:30 at 08:30:00 PM  SLEEP STUDY TECHNIQUE A multi-channel overnight Polysomnography study was performed. The channels recorded and monitored were central and occipital EEG, electrooculogram (EOG), submentalis EMG (chin), nasal and oral airflow, thoracic and abdominal wall motion, anterior tibialis EMG, snore microphone, electrocardiogram, and a pulse oximetry. TECHNICIAN COMMENTS Comments added by Technician: PATIENT WAS ORDERED AS A NPSG ONLY. Comments added by Scorer: N/A SLEEP ARCHITECTURE The study was initiated at 11:07:30 PM and terminated at 5:25:28 AM. The total recorded time was 378 minutes. EEG confirmed total sleep time was 345.5 minutes yielding a sleep efficiency of 91.4%%. Sleep onset after lights out was 13.4 minutes with a REM latency of 190.5 minutes. The patient spent 4.8%% of the night in stage N1 sleep, 91.9%% in stage N2 sleep, 0.0%% in stage N3 and 3.3% in REM. Wake after sleep onset (WASO) was 19.1 minutes. The Arousal Index was 23.4/hour. RESPIRATORY PARAMETERS There were a total of 19 respiratory disturbances out of which 19 were apneas ( 14 obstructive, 3 mixed, 2 central) and 0  hypopneas. The apnea/hypopnea index (AHI) was 3.3 events/hour. The central sleep apnea index was 0.3 events/hour. The REM AHI was 0.0 events/hour and NREM AHI was 3.4 events/hour. The supine AHI was 9.1 events/hour and the non supine AHI was 1 supine during 28.65% of sleep. Respiratory disturbances were associated with oxygen desaturation down to a nadir of 97.0% during sleep. The mean oxygen saturation during the study was 98.4%.  LEG MOVEMENT DATA The total leg movements were 7 with a resulting leg movement index of 1.2/hr .Associated arousal with leg movement index was 0.0/hr.  CARDIAC DATA The underlying cardiac rhythm was most consistent with sinus rhythm. Mean heart rate during sleep was 85.1 bpm. Additional rhythm abnormalities include None.  IMPRESSIONS - No Significant Obstructive Sleep apnea(OSA) - EKG showed no cardiac abnormalities. - No significant Oxygen Desaturation - The patient snored with moderate snoring volume. - No significant periodic leg movements(PLMs) during sleep. However, no significant associated arousals.  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 - Avoid alcohol, sedatives and other CNS depressants that may worsen sleep apnea and disrupt normal sleep architecture. - Sleep hygiene should be reviewed to assess factors that may improve sleep quality. - Weight management and regular exercise should be initiated or continued.  [Electronically signed] 08/17/2022 08:57 PM  Sherrilyn Rist MD NPI: [id number]

## 2022-08-19 ENCOUNTER — Encounter: Payer: Self-pay | Admitting: Cardiovascular Disease

## 2022-08-19 NOTE — H&P (View-Only) (Signed)
Cardiology Office Note   Date:  08/20/2022   ID:  [name], DOB 04-22-1971, MRN [id number]  PCP:  Su Grand, MD  Cardiologist:   Mertie Moores, MD   Chief Complaint  Patient presents with   Shortness of Breath   Hypertension        Problem List:  1. Palpitations/premature ventricular contractions 2.  history of Hodgkin's lymphoma-status post mantle radiation 3. History of breast cancer-status post Reconstruction 4. History of cervical cancer 5. 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 2015 for regrowth of tumor.  6. History of thyroidectomy - hx of thyroid cancer  7. Raynaud's phenomenon 8. Appendectomy 9. History chest pains-normal heart catheterization, normal TEE 10.   history of adrenal insufficiency  11. Hyperlipidemia   Previous notes.[name] is done very well since I last saw her about a year ago. She has had some occasional palpitations that are typically controlled with metoprolol. She's not had any episodes of chest pain. She's been able to stop all of her steroids. Her adrenal glands have gradually improved and her now producing cortisol.  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  She is still working at [employer].   Feb 24, 2013:  She has done well since I last saw her .  She has had her breast implants replaced ( old ones were leaking).  Her costochondritis has resolved.  She has not been lifting.    She has run in 6 5K races this year.  She is playing tennis.    She is still in [place] school.  She is doing her orthopedic rotation.    Nov. 11, 2014:  Her HR has been high.  She has a head ache frequently.  She started Adderall recently.   The adderall has help with her focus but she feels much worse in it.   She has been on the Adderall for 2 months and the tachycardia started about 1 week ago.  She was doing cycle  classes 3 times a week.  She is exercising regularly and for the past week, her HR is extremely fast.   She has gone into menapause and has lots of hot flashes.   Feb. 23, 2015:  She has been having some recent episodes of tachycardia. We increased her metoprolol at the last visit. We performed an echocardiogram which revealed   Left ventricle: The cavity size was normal. Systolic function was normal. The estimated ejection fraction was in the range of 55% to 60%. Wall motion was normal; there were no regional wall motion abnormalities. Left ventricular diastolic function parameters were normal. - Aortic valve: Trivial regurgitation. - Mitral valve: Trivial regurgitation  She tried to stop her Adderall ( did not do well with that).   She decreased her dose slightly and she is not having much tachycardia. She has been   She has been found to have some osteoperosis and osteopenia.    March 09, 2014:  [name] is doing ok.. No recent cardiac problems.   She has been very active and is feeling great.  Playing tennis on a regular basis.     Feb. 17, 2016:   [name] is a 51 y.o. female who presents for for evaluation of palpitations , hypotension. Echo yesterday  Left ventricle: The cavity size was normal. Systolic function was   normal. The estimated ejection  fraction was in the range of 55%   to 60%. Wall motion was normal; there were no regional wall   motion abnormalities. - Aortic valve: There was trivial regurgitation. - Mitral valve: Calcified annulus.  Labs were ordered yesterday but she was not able to get them. Continues to have palpitations today She is off her Estrogen at this point.  She is back on the Toprol - skipped 2 days.  Has been taking the PRN propranolol  Not feeling well and has not been eating or drinking well. We given 1 dose of lasix   December 15, 2014:  She has continued to have palpitations  - last 20-30 seconds .  HR has remained fairly high. Has been  working out regularly .  These are going ok.    Feb 03, 2015: [name] continues to have palpitations . She wore a monitor but actually only wore is for about 1/2 of the time ( she was at [employer]) We have discussed implantable loop.  I have some concerns about doing any procedures on her .  She has only been taking the toprol 50 mg a day instead of 75.   May 17, 2015:  [name] is seen today for follow up. She has a history of hyperlipidemia and we sent her to lipid clinic. She is tolerating the Crestor very well.   Still having palpitations but learning how to manage .  Oct. 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 Has been seen at [facility] and is now at [facility]  She has sent scans to [facility]. Is on steroids to shrink tumor, has gained lots of weight.  Is hoping to be able to get a bx so that she have this tumor treated.   She is now on home O2.  O2 sats dropped to 78 while sleeping on room air.    O2 sats on 2.5 liters are good. She uses 4 lpm at night .   Is having more palpitations. Would like a refill for propranolol'   April 18, 2017:  [name] is seen today  Is still on home O2.,  Has chronic respiratory failure ( unclear etiology at this point )   Has RV failure.   Had a right heart cath.  PA pressures were only minimally elevated.  Had significant leg swelling   Has been on steroids.  Has developed pre-diabetes  Nov. 15,, 2018  In Sept 25,she woke up from a nap Ad tingling and numbness in her legs , had no strength in her legs . 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   2nd MRI of back showed a lesion between T10 -T 11.   Nothing that truly showed compression.  Was transferred to rehab unit in [place].    Has regained some motor ability I her lower abd.    Ruptured her left breast implant using her arms in rehab  Scheduled to have her implants removed later this month .   Still on home O2,  Palpitations have been well controlled.   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 ( Bronch in Dec. 2018)    Has pulmonary HTN.  Lasix is no working for her at this  point  - takes Lasix 20 mg a day   Is using a walker now - was in a wheelchair last time  Had temporary paralysis of her legs.  Improving .  Regaining her strength.    Jan. 27, 2020:  [name] is seen for follow up  Has had pressure ulders in her hands  Is on chronic steroids for her adrenal insuff.  Has had an echocardiogram at [facility] in December.  She has normal left ventricular systolic function with an ejection fraction of 55%.  She has trivial mitral regurgitation and trivial tricuspid regurgitation as well as trivial aortic insufficiency. Was no comment on pulmonary pressures.  Sept. 23, 2022:   [name] is seen  Covid infection 11 days ago ,  hreceived Manufacturing engineer. 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 16, 2023 [name] is seen for follow up of her palpitations, adrenal insufficiency Chest pain   Recently in the hospital  Hospitalized 4 times since June.  Has had URIs   She was in the ICU during this last hospitalization  Fevers / chills  Was found to have pneumonia , Temp of 102   Has diffuse whole body anasarca , pitting in her hands   Taking nebs   Coughs when she lies back  Will repeat her echo  [facility]  Start Entresto 24-26 BID ( has documented diastolic CHF)    Nov. 14, 2023  No show, patient cancelled   Aug 20, 2022 [name] is seen for follow up of her severe dyspnea, wheezing, diffuse wheezing , productive cough She was started on Entresto   Echo from Oct. 30, 2023 Normal LV  systolic function,  grade I DD RV function and pressure was normal   Has been having some CP , chest pressure  Radiates to intrascapular   Had RCA coronary calcification   We discussed her angina  HR is too fast for Cor CTA   Discussed cath  Will discuss with Dr. Haroldine Laws  Start bisoprolol 2.5 QD  She has been a DNR in the past due to her gradually declining condition.   I have suggested that she rescind her DNR during the cath      Past Medical History:  Diagnosis Date   Addison's disease ([place])    Adrenal insufficiency ([place])    Anemia    Anxiety    Aortic stenosis    Aortic stenosis    Appendicitis 12/19/09   Appendicitis    Breast cancer (HCC)    STATUS POST BILATERAL MASTECTOMY. STATUS POST RECONSTRUCTION. SHE HAD SILICONE BREAST IMPLANTS AND THE LEFT IMPLANT IS LEAKING SLIGHTLY   Cellulitis of right middle finger 11/07/2018   Cervical cancer ([place]) 12/23/2018   Chest pain    CHF with right heart failure ([place]) 04/17/2017   Chronic respiratory failure with hypoxia (HCC) 12/23/2018   Cough variant asthma 04/13/2019   Depression    GERD (gastroesophageal reflux disease)    takes Dexilant and carafate and gi coctail    Headache    migraines on a daily and monthly regimen    Heart murmur    History of kidney stones    Hodgkin lymphoma ([place])    STATUS POST MANTLE RADIATION   Hodgkin's lymphoma ([place])    1987   Hypertension    Hypoxia    Necrotizing fasciitis ([place])  12/23/2018   Non-ischemic cardiomyopathy ([place])    Osteoporosis    Palpitations    Pituitary adenoma ([place]) 12/23/2018   Pneumonia    PONV (postoperative nausea and vomiting)    Pre-diabetes    per pt; no meds   Pulmonary hypertension ([place]) 12/23/2018   Raynaud phenomenon    Right heart failure ([place]) 04/17/2017   Seizures ([place])    last febrile seizure was approx 3 weeks ago per report on 12/01/2020   Sleep apnea    upcoming sleep study per pt    Supplemental oxygen dependent    3 liters   SVT  (supraventricular tachycardia)    Tachycardia    Thyroid cancer ([place])    STATUS POST SURGICAL REMOVAL-CURRENT ON THYROID REPLACEMENT    Past Surgical History:  Procedure Laterality Date   ABDOMINAL HYSTERECTOMY     AMPUTATION Left 01/30/2019   Procedure: Left Index finger amputation with flap reconstruction and repair reconstruction;  Surgeon: Roseanne Kaufman, MD;  Location: [place];  Service: Orthopedics;  Laterality: Left;   APPENDECTOMY     breast implants and removal      breast implants but leaking      CARDIAC CATHETERIZATION  05/18/09   NORMAL CATH   COLONOSCOPY     hx of chemotherapy      hx of radiation therapy      I & D EXTREMITY Left 12/23/2018   Procedure: IRRIGATION AND DEBRIDEMENT HAND / INDEX FINGER;  Surgeon: Roseanne Kaufman, MD;  Location: [place];  Service: Orthopedics;  Laterality: Left;   IR CV LINE INJECTION  03/22/2022   IR IMAGING GUIDED PORT INSERTION  05/06/2020   IR IMAGING GUIDED PORT INSERTION  12/04/2021   IR REMOVAL TUN ACCESS W/ PORT W/O FL MOD SED  04/27/2020   IR REMOVAL TUN ACCESS W/ PORT W/O FL MOD SED  12/04/2021   IR REMOVAL TUN ACCESS W/ PORT W/O FL MOD SED  03/30/2022   KIDNEY STONE SURGERY     LUMBAR PUNCTURE W/ INTRATHECAL CHEMOTHERAPY     MASTECTOMY     PITUITARY SURGERY     RIGHT/LEFT HEART CATH AND CORONARY ANGIOGRAPHY N/A 04/02/2018   Procedure: RIGHT/LEFT HEART CATH AND CORONARY ANGIOGRAPHY;  Surgeon: Burnell Blanks, MD;  Location: [place] [facility];  Service: Cardiovascular;  Laterality: N/A;   TOOTH EXTRACTION N/A 12/05/2020   Procedure: DENTAL RESTORATION/EXTRACTIONS;  Surgeon: Ronal Fear, MD;  Location: [facility];  Service: Oral Surgery;  Laterality: N/A;   TOTAL THYROIDECTOMY     VIDEO BRONCHOSCOPY Bilateral 11/14/2018   Procedure: VIDEO BRONCHOSCOPY WITHOUT FLUORO;  Surgeon: Margaretha Seeds, MD;  Location: [place];  Service: Cardiopulmonary;  Laterality: Bilateral;   VIDEO BRONCHOSCOPY WITH ENDOBRONCHIAL ULTRASOUND N/A  11/19/2018   Procedure: VIDEO BRONCHOSCOPY WITH ENDOBRONCHIAL ULTRASOUND;  Surgeon: Margaretha Seeds, MD;  Location: [facility];  Service: Thoracic;  Laterality: N/A;     Current Outpatient Medications  Medication Sig Dispense Refill   albuterol (PROVENTIL) (2.5 MG/3ML) 0.083% nebulizer solution Take 3 mLs (2.5 mg total) by nebulization every 6 (six) hours as needed for wheezing or shortness of breath. 360 mL 2   aspirin EC 81 MG tablet Take 1 tablet (81 mg total) by mouth daily. Swallow whole. 90 tablet 3   bisoprolol (ZEBETA) 5 MG tablet Take 0.5 tablet by mouth daily 45 tablet 3   bumetanide (BUMEX) 1 MG tablet Take 1 tablet (1 mg total) by mouth 2 (two) times daily. 60 tablet 0  buPROPion (WELLBUTRIN XL) 150 MG 24 hr tablet TAKE 1 TABLET BY MOUTH EVERY DAY 90 tablet 0   clonazePAM (KLONOPIN) 0.5 MG tablet Take 1 tablet (0.5 mg total) by mouth 3 (three) times daily as needed for anxiety. 90 tablet 2   dexlansoprazole (DEXILANT) 60 MG capsule Take 60 mg by mouth daily.     Diclofenac Potassium,Migraine, (CAMBIA) 50 MG PACK Take 50 mg by mouth daily as needed for migraine.     diphenhydrAMINE (BENADRYL) 50 MG/ML injection Inject 25 mg into the muscle once as needed (for nausea).     dronabinol (MARINOL) 2.5 MG capsule TAKE 1 CAPSULE (2.5 MG TOTAL) BY MOUTH 2 (TWO) TIMES DAILY AS NEEDED (NAUSEA). 30 capsule 0   EPINEPHrine 0.3 mg/0.3 mL IJ SOAJ injection Inject 0.3 mg into the muscle as needed for anaphylaxis. 2 each 2   escitalopram (LEXAPRO) 20 MG tablet Take 1 tablet (20 mg total) by mouth every evening. 90 tablet 3   FLORASTOR 250 MG capsule Take 250 mg by mouth See admin instructions. Take 250 mg by mouth mid-morning and mid-afternoon     fluticasone-salmeterol (ADVAIR HFA) 230-21 MCG/ACT inhaler INHALE 2 PUFFS INTO THE LUNGS TWICE A DAY 12 each 5   hydrocortisone (CORTEF) 10 MG tablet Take 1-2 tablets (10-20 mg total) by mouth See admin instructions. Take 20 mg in the am and 17m in the  evening. Take after completing prednisone     lamoTRIgine (LAMICTAL) 150 MG tablet Take one tablet at bedtime. 90 tablet 1   Lancets (ONETOUCH DELICA PLUS [id number] MISC 3 (three) times daily. for testing     montelukast (SINGULAIR) 10 MG tablet Take 1 tablet (10 mg total) by mouth at bedtime. 30 tablet 2   nitroGLYCERIN (NITROSTAT) 0.4 MG SL tablet Dissolve 1 tablet under the tongue every 5 minutes as needed for chest pain. Max of 3 doses, then 911. 25 tablet 6   ONETOUCH VERIO test strip 3 (three) times daily. for testing     OXYGEN Inhale 3-4 L/min into the lungs continuous.     PROAIR HFA 108 (90 Base) MCG/ACT inhaler Inhale 2 puffs into the lungs every 4 (four) hours as needed for wheezing or shortness of breath.     Rimegepant Sulfate (NURTEC) 75 MG TBDP Take 75 mg by mouth daily as needed (Migraine).      rosuvastatin (CRESTOR) 10 MG tablet Take 10 mg by mouth See admin instructions. Take 10 mg by mouth mid-morning every day     sacubitril-valsartan (ENTRESTO) 24-26 MG Take 1 tablet by mouth 2 (two) times daily. 60 tablet 11   sitaGLIPtin (JANUVIA) 100 MG tablet Take 1 tablet (100 mg total) by mouth daily.     sucralfate (CARAFATE) 1 GM/10ML suspension Take 1 g by mouth daily as needed (as directed for ulcers).     SYNTHROID 100 MCG tablet Take 1 tablet (100 mcg total) by mouth every morning.     Tiotropium Bromide Monohydrate (SPIRIVA RESPIMAT) 1.25 MCG/ACT AERS Inhale 2 puffs into the lungs daily. 4 g 5   topiramate (TOPAMAX) 50 MG tablet Take 150 mg by mouth at bedtime.      umeclidinium bromide (INCRUSE ELLIPTA) 62.5 MCG/ACT AEPB Inhale 1 puff into the lungs daily. 30 each 2   zolpidem (AMBIEN CR) 12.5 MG CR tablet Take 12.5 mg by mouth at bedtime.     amoxicillin-clavulanate (AUGMENTIN) 875-125 MG tablet Take 1 tablet by mouth 2 (two) times daily. (Patient not taking: Reported on 08/15/2022) 10  tablet 0   EMGALITY 120 MG/ML SOSY Inject 120 mg into the skin every 28 (twenty-eight)  days. (Patient not taking: Reported on 08/15/2022)     guaiFENesin (ROBITUSSIN) 100 MG/5ML liquid Take 15 mLs by mouth every 6 (six) hours as needed for cough or to loosen phlegm. (Patient not taking: Reported on 08/20/2022) 120 mL 0   Current Facility-Administered Medications  Medication Dose Route Frequency Provider Last Rate Last Admin   0.9 %  sodium chloride infusion   Intravenous PRN Margaretha Seeds, MD       Mepolizumab SOLR 100 mg  100 mg Subcutaneous Q28 days Margaretha Seeds, MD   100 mg at 08/31/19 1017    Allergies:   Ferrous bisglycinate chelate [iron], Mushroom extract complex, Na ferric gluc cplx in sucrose, Cymbalta [duloxetine hcl], Ondansetron hcl, Promethazine, Promethazine hcl, Succinylcholine, Buprenorphine hcl, Compazine, Duloxetine, Metoclopramide, Morphine and related, Ondansetron, Promethazine hcl, and Tegaderm ag mesh [silver]    Social History:  The patient  reports that she has never smoked. She has never used smokeless tobacco. She reports that she does not currently use alcohol. She reports that she does not use drugs.   Family History:  The patient's Family history is unknown by patient.    ROS:  Please see the history of present illness.   Physical Exam: Blood pressure 102/68, pulse (!) 122, height _0  (1.651 m), weight 181 lb (82.1 kg), SpO2 98 %.    GEN:    Middle-aged female,   Moderately obese. HEENT: Normal NECK: No JVD; No carotid bruits LYMPHATICS: No lymphadenopathy CARDIAC: RRR , no murmurs, rubs, gallops RESPIRATORY:  Clear to auscultation without rales, wheezing or rhonchi  ABDOMEN: Soft, non-tender, non-distended MUSCULOSKELETAL:  No edema; No deformity  SKIN: Warm and dry NEUROLOGIC:  Alert and oriented x 3   EKG:    Recent Labs: 02/09/2022: TSH 0.370 07/01/2022: ALT 29; B Natriuretic Peptide 63.5 07/06/2022: Hemoglobin 8.0; Platelets 310 07/08/2022: BUN 27; Creatinine, Ser 1.20; Magnesium 2.3; Potassium 4.4; Sodium 136     Lipid Panel    Component Value Date/Time   CHOL 178 09/22/2020 0935   CHOL 204 (H) 01/23/2018 1225   CHOL 207 (H) 12/08/2013 0823   TRIG 253 (H) 09/22/2020 0935   TRIG 96 12/08/2013 0823   HDL 44 09/22/2020 0935   HDL 60 01/23/2018 1225   HDL 57 12/08/2013 0823   CHOLHDL 4.0 09/22/2020 0935   VLDL 51 (H) 09/22/2020 0935   LDLCALC 83 09/22/2020 0935   LDLCALC 107 (H) 01/23/2018 1225   LDLCALC 131 (H) 12/08/2013 0823      Wt Readings from Last 3 Encounters:  08/20/22 181 lb (82.1 kg)  08/15/22 181 lb (82.1 kg)  08/01/22 179 lb (81.2 kg)      Other studies Reviewed: Additional studies/ records that were reviewed today include: . Review of the above records demonstrates:    ASSESSMENT AND PLAN:  -  chest pain / tightness : She has presented with chest tightness and chest fullness.  She has had several normal heart catheterizations in the past.  We discussed possibly doing a coronary CT angiogram but her heart rate is so fast that I do not think that we will be able to get it into the acceptable range.  She has normal left ventricular systolic function so it does not appear that we will be able to use Corlanor on a routine basis.  Have given her prescription for nitroglycerin.  I will set her up  for right and left heart catheterization with the advanced heart failure team.  We we have discussed the risk, benefits, option of heart catheterization.  She understands and agrees to proceed.  -  Right heart failure:     has symptoms of RV failure but the RV size and function appeared normal at her last echo  We will proceed with right heart catheterization in the next week or so.        - Palpitations/premature ventricular contractions -      -  history of Hodgkin's lymphoma-status post mantle radiation   - History of breast cancer-s/p reconstruction    4. History of cervical cancer  5. History of pituitary tumor-  6. History of thyroidectomy -       7.  Raynaud's phenomenon  8. Appendectomy  9. History chest pains- . 10.   history of adrenal insufficiency  -   continue Cortef.   Current medicines are reviewed at length with the patient today.  The patient does not have concerns regarding medicines.  The following changes have been made:  no change  Disposition:    will follow up in 2 months    Signed, Mertie Moores, MD  08/20/2022 3:25 PM    [facility] [address], [place], [place]  [address] Phone: [phone]; Fax: [phone]

## 2022-08-19 NOTE — Progress Notes (Unsigned)
Cardiology Office Note   Date:  08/20/2022   ID:  [name], DOB 04-22-1971, MRN [id number]  PCP:  Su Grand, MD  Cardiologist:   Mertie Moores, MD   Chief Complaint  Patient presents with   Shortness of Breath   Hypertension        Problem List:  1. Palpitations/premature ventricular contractions 2.  history of Hodgkin's lymphoma-status post mantle radiation 3. History of breast cancer-status post Reconstruction 4. History of cervical cancer 5. 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 2015 for regrowth of tumor.  6. History of thyroidectomy - hx of thyroid cancer  7. Raynaud's phenomenon 8. Appendectomy 9. History chest pains-normal heart catheterization, normal TEE 10.   history of adrenal insufficiency  11. Hyperlipidemia   Previous notes.[name] is done very well since I last saw her about a year ago. She has had some occasional palpitations that are typically controlled with metoprolol. She's not had any episodes of chest pain. She's been able to stop all of her steroids. Her adrenal glands have gradually improved and her now producing cortisol.  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  She is still working at [employer].   Feb 24, 2013:  She has done well since I last saw her .  She has had her breast implants replaced ( old ones were leaking).  Her costochondritis has resolved.  She has not been lifting.    She has run in 6 5K races this year.  She is playing tennis.    She is still in [place] school.  She is doing her orthopedic rotation.    Nov. 11, 2014:  Her HR has been high.  She has a head ache frequently.  She started Adderall recently.   The adderall has help with her focus but she feels much worse in it.   She has been on the Adderall for 2 months and the tachycardia started about 1 week ago.  She was doing cycle  classes 3 times a week.  She is exercising regularly and for the past week, her HR is extremely fast.   She has gone into menapause and has lots of hot flashes.   Feb. 23, 2015:  She has been having some recent episodes of tachycardia. We increased her metoprolol at the last visit. We performed an echocardiogram which revealed   Left ventricle: The cavity size was normal. Systolic function was normal. The estimated ejection fraction was in the range of 55% to 60%. Wall motion was normal; there were no regional wall motion abnormalities. Left ventricular diastolic function parameters were normal. - Aortic valve: Trivial regurgitation. - Mitral valve: Trivial regurgitation  She tried to stop her Adderall ( did not do well with that).   She decreased her dose slightly and she is not having much tachycardia. She has been   She has been found to have some osteoperosis and osteopenia.    March 09, 2014:  [name] is doing ok.. No recent cardiac problems.   She has been very active and is feeling great.  Playing tennis on a regular basis.     Feb. 17, 2016:   [name] is a 51 y.o. female who presents for for evaluation of palpitations , hypotension. Echo yesterday  Left ventricle: The cavity size was normal. Systolic function was   normal. The estimated ejection  fraction was in the range of 55%   to 60%. Wall motion was normal; there were no regional wall   motion abnormalities. - Aortic valve: There was trivial regurgitation. - Mitral valve: Calcified annulus.  Labs were ordered yesterday but she was not able to get them. Continues to have palpitations today She is off her Estrogen at this point.  She is back on the Toprol - skipped 2 days.  Has been taking the PRN propranolol  Not feeling well and has not been eating or drinking well. We given 1 dose of lasix   December 15, 2014:  She has continued to have palpitations  - last 20-30 seconds .  HR has remained fairly high. Has been  working out regularly .  These are going ok.    Feb 03, 2015: [name] continues to have palpitations . She wore a monitor but actually only wore is for about 1/2 of the time ( she was at [employer]) We have discussed implantable loop.  I have some concerns about doing any procedures on her .  She has only been taking the toprol 50 mg a day instead of 75.   May 17, 2015:  [name] is seen today for follow up. She has a history of hyperlipidemia and we sent her to lipid clinic. She is tolerating the Crestor very well.   Still having palpitations but learning how to manage .  Oct. 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 Has been seen at [facility] and is now at [facility]  She has sent scans to [facility]. Is on steroids to shrink tumor, has gained lots of weight.  Is hoping to be able to get a bx so that she have this tumor treated.   She is now on home O2.  O2 sats dropped to 78 while sleeping on room air.    O2 sats on 2.5 liters are good. She uses 4 lpm at night .   Is having more palpitations. Would like a refill for propranolol'   April 18, 2017:  [name] is seen today  Is still on home O2.,  Has chronic respiratory failure ( unclear etiology at this point )   Has RV failure.   Had a right heart cath.  PA pressures were only minimally elevated.  Had significant leg swelling   Has been on steroids.  Has developed pre-diabetes  Nov. 15,, 2018  In Sept 25,she woke up from a nap Ad tingling and numbness in her legs , had no strength in her legs . 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   2nd MRI of back showed a lesion between T10 -T 11.   Nothing that truly showed compression.  Was transferred to rehab unit in [place].    Has regained some motor ability I her lower abd.    Ruptured her left breast implant using her arms in rehab  Scheduled to have her implants removed later this month .   Still on home O2,  Palpitations have been well controlled.   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 ( Bronch in Dec. 2018)    Has pulmonary HTN.  Lasix is no working for her at this  point  - takes Lasix 20 mg a day   Is using a walker now - was in a wheelchair last time  Had temporary paralysis of her legs.  Improving .  Regaining her strength.    Jan. 27, 2020:  [name] is seen for follow up  Has had pressure ulders in her hands  Is on chronic steroids for her adrenal insuff.  Has had an echocardiogram at [facility] in December.  She has normal left ventricular systolic function with an ejection fraction of 55%.  She has trivial mitral regurgitation and trivial tricuspid regurgitation as well as trivial aortic insufficiency. Was no comment on pulmonary pressures.  Sept. 23, 2022:   [name] is seen  Covid infection 11 days ago ,  hreceived Manufacturing engineer. 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 16, 2023 [name] is seen for follow up of her palpitations, adrenal insufficiency Chest pain   Recently in the hospital  Hospitalized 4 times since June.  Has had URIs   She was in the ICU during this last hospitalization  Fevers / chills  Was found to have pneumonia , Temp of 102   Has diffuse whole body anasarca , pitting in her hands   Taking nebs   Coughs when she lies back  Will repeat her echo  [facility]  Start Entresto 24-26 BID ( has documented diastolic CHF)    Nov. 14, 2023  No show, patient cancelled   Aug 20, 2022 [name] is seen for follow up of her severe dyspnea, wheezing, diffuse wheezing , productive cough She was started on Entresto   Echo from Oct. 30, 2023 Normal LV  systolic function,  grade I DD RV function and pressure was normal   Has been having some CP , chest pressure  Radiates to intrascapular   Had RCA coronary calcification   We discussed her angina  HR is too fast for Cor CTA   Discussed cath  Will discuss with Dr. Haroldine Laws  Start bisoprolol 2.5 QD  She has been a DNR in the past due to her gradually declining condition.   I have suggested that she rescind her DNR during the cath      Past Medical History:  Diagnosis Date   Addison's disease ([place])    Adrenal insufficiency ([place])    Anemia    Anxiety    Aortic stenosis    Aortic stenosis    Appendicitis 12/19/09   Appendicitis    Breast cancer (HCC)    STATUS POST BILATERAL MASTECTOMY. STATUS POST RECONSTRUCTION. SHE HAD SILICONE BREAST IMPLANTS AND THE LEFT IMPLANT IS LEAKING SLIGHTLY   Cellulitis of right middle finger 11/07/2018   Cervical cancer ([place]) 12/23/2018   Chest pain    CHF with right heart failure ([place]) 04/17/2017   Chronic respiratory failure with hypoxia (HCC) 12/23/2018   Cough variant asthma 04/13/2019   Depression    GERD (gastroesophageal reflux disease)    takes Dexilant and carafate and gi coctail    Headache    migraines on a daily and monthly regimen    Heart murmur    History of kidney stones    Hodgkin lymphoma ([place])    STATUS POST MANTLE RADIATION   Hodgkin's lymphoma ([place])    1987   Hypertension    Hypoxia    Necrotizing fasciitis ([place])  12/23/2018   Non-ischemic cardiomyopathy ([place])    Osteoporosis    Palpitations    Pituitary adenoma ([place]) 12/23/2018   Pneumonia    PONV (postoperative nausea and vomiting)    Pre-diabetes    per pt; no meds   Pulmonary hypertension ([place]) 12/23/2018   Raynaud phenomenon    Right heart failure ([place]) 04/17/2017   Seizures ([place])    last febrile seizure was approx 3 weeks ago per report on 12/01/2020   Sleep apnea    upcoming sleep study per pt    Supplemental oxygen dependent    3 liters   SVT  (supraventricular tachycardia)    Tachycardia    Thyroid cancer ([place])    STATUS POST SURGICAL REMOVAL-CURRENT ON THYROID REPLACEMENT    Past Surgical History:  Procedure Laterality Date   ABDOMINAL HYSTERECTOMY     AMPUTATION Left 01/30/2019   Procedure: Left Index finger amputation with flap reconstruction and repair reconstruction;  Surgeon: Roseanne Kaufman, MD;  Location: [place];  Service: Orthopedics;  Laterality: Left;   APPENDECTOMY     breast implants and removal      breast implants but leaking      CARDIAC CATHETERIZATION  05/18/09   NORMAL CATH   COLONOSCOPY     hx of chemotherapy      hx of radiation therapy      I & D EXTREMITY Left 12/23/2018   Procedure: IRRIGATION AND DEBRIDEMENT HAND / INDEX FINGER;  Surgeon: Roseanne Kaufman, MD;  Location: [place];  Service: Orthopedics;  Laterality: Left;   IR CV LINE INJECTION  03/22/2022   IR IMAGING GUIDED PORT INSERTION  05/06/2020   IR IMAGING GUIDED PORT INSERTION  12/04/2021   IR REMOVAL TUN ACCESS W/ PORT W/O FL MOD SED  04/27/2020   IR REMOVAL TUN ACCESS W/ PORT W/O FL MOD SED  12/04/2021   IR REMOVAL TUN ACCESS W/ PORT W/O FL MOD SED  03/30/2022   KIDNEY STONE SURGERY     LUMBAR PUNCTURE W/ INTRATHECAL CHEMOTHERAPY     MASTECTOMY     PITUITARY SURGERY     RIGHT/LEFT HEART CATH AND CORONARY ANGIOGRAPHY N/A 04/02/2018   Procedure: RIGHT/LEFT HEART CATH AND CORONARY ANGIOGRAPHY;  Surgeon: Burnell Blanks, MD;  Location: [place] [facility];  Service: Cardiovascular;  Laterality: N/A;   TOOTH EXTRACTION N/A 12/05/2020   Procedure: DENTAL RESTORATION/EXTRACTIONS;  Surgeon: Ronal Fear, MD;  Location: [facility];  Service: Oral Surgery;  Laterality: N/A;   TOTAL THYROIDECTOMY     VIDEO BRONCHOSCOPY Bilateral 11/14/2018   Procedure: VIDEO BRONCHOSCOPY WITHOUT FLUORO;  Surgeon: Margaretha Seeds, MD;  Location: [place];  Service: Cardiopulmonary;  Laterality: Bilateral;   VIDEO BRONCHOSCOPY WITH ENDOBRONCHIAL ULTRASOUND N/A  11/19/2018   Procedure: VIDEO BRONCHOSCOPY WITH ENDOBRONCHIAL ULTRASOUND;  Surgeon: Margaretha Seeds, MD;  Location: [facility];  Service: Thoracic;  Laterality: N/A;     Current Outpatient Medications  Medication Sig Dispense Refill   albuterol (PROVENTIL) (2.5 MG/3ML) 0.083% nebulizer solution Take 3 mLs (2.5 mg total) by nebulization every 6 (six) hours as needed for wheezing or shortness of breath. 360 mL 2   aspirin EC 81 MG tablet Take 1 tablet (81 mg total) by mouth daily. Swallow whole. 90 tablet 3   bisoprolol (ZEBETA) 5 MG tablet Take 0.5 tablet by mouth daily 45 tablet 3   bumetanide (BUMEX) 1 MG tablet Take 1 tablet (1 mg total) by mouth 2 (two) times daily. 60 tablet 0  buPROPion (WELLBUTRIN XL) 150 MG 24 hr tablet TAKE 1 TABLET BY MOUTH EVERY DAY 90 tablet 0   clonazePAM (KLONOPIN) 0.5 MG tablet Take 1 tablet (0.5 mg total) by mouth 3 (three) times daily as needed for anxiety. 90 tablet 2   dexlansoprazole (DEXILANT) 60 MG capsule Take 60 mg by mouth daily.     Diclofenac Potassium,Migraine, (CAMBIA) 50 MG PACK Take 50 mg by mouth daily as needed for migraine.     diphenhydrAMINE (BENADRYL) 50 MG/ML injection Inject 25 mg into the muscle once as needed (for nausea).     dronabinol (MARINOL) 2.5 MG capsule TAKE 1 CAPSULE (2.5 MG TOTAL) BY MOUTH 2 (TWO) TIMES DAILY AS NEEDED (NAUSEA). 30 capsule 0   EPINEPHrine 0.3 mg/0.3 mL IJ SOAJ injection Inject 0.3 mg into the muscle as needed for anaphylaxis. 2 each 2   escitalopram (LEXAPRO) 20 MG tablet Take 1 tablet (20 mg total) by mouth every evening. 90 tablet 3   FLORASTOR 250 MG capsule Take 250 mg by mouth See admin instructions. Take 250 mg by mouth mid-morning and mid-afternoon     fluticasone-salmeterol (ADVAIR HFA) 230-21 MCG/ACT inhaler INHALE 2 PUFFS INTO THE LUNGS TWICE A DAY 12 each 5   hydrocortisone (CORTEF) 10 MG tablet Take 1-2 tablets (10-20 mg total) by mouth See admin instructions. Take 20 mg in the am and 17m in the  evening. Take after completing prednisone     lamoTRIgine (LAMICTAL) 150 MG tablet Take one tablet at bedtime. 90 tablet 1   Lancets (ONETOUCH DELICA PLUS [id number] MISC 3 (three) times daily. for testing     montelukast (SINGULAIR) 10 MG tablet Take 1 tablet (10 mg total) by mouth at bedtime. 30 tablet 2   nitroGLYCERIN (NITROSTAT) 0.4 MG SL tablet Dissolve 1 tablet under the tongue every 5 minutes as needed for chest pain. Max of 3 doses, then 911. 25 tablet 6   ONETOUCH VERIO test strip 3 (three) times daily. for testing     OXYGEN Inhale 3-4 L/min into the lungs continuous.     PROAIR HFA 108 (90 Base) MCG/ACT inhaler Inhale 2 puffs into the lungs every 4 (four) hours as needed for wheezing or shortness of breath.     Rimegepant Sulfate (NURTEC) 75 MG TBDP Take 75 mg by mouth daily as needed (Migraine).      rosuvastatin (CRESTOR) 10 MG tablet Take 10 mg by mouth See admin instructions. Take 10 mg by mouth mid-morning every day     sacubitril-valsartan (ENTRESTO) 24-26 MG Take 1 tablet by mouth 2 (two) times daily. 60 tablet 11   sitaGLIPtin (JANUVIA) 100 MG tablet Take 1 tablet (100 mg total) by mouth daily.     sucralfate (CARAFATE) 1 GM/10ML suspension Take 1 g by mouth daily as needed (as directed for ulcers).     SYNTHROID 100 MCG tablet Take 1 tablet (100 mcg total) by mouth every morning.     Tiotropium Bromide Monohydrate (SPIRIVA RESPIMAT) 1.25 MCG/ACT AERS Inhale 2 puffs into the lungs daily. 4 g 5   topiramate (TOPAMAX) 50 MG tablet Take 150 mg by mouth at bedtime.      umeclidinium bromide (INCRUSE ELLIPTA) 62.5 MCG/ACT AEPB Inhale 1 puff into the lungs daily. 30 each 2   zolpidem (AMBIEN CR) 12.5 MG CR tablet Take 12.5 mg by mouth at bedtime.     amoxicillin-clavulanate (AUGMENTIN) 875-125 MG tablet Take 1 tablet by mouth 2 (two) times daily. (Patient not taking: Reported on 08/15/2022) 10  tablet 0   EMGALITY 120 MG/ML SOSY Inject 120 mg into the skin every 28 (twenty-eight)  days. (Patient not taking: Reported on 08/15/2022)     guaiFENesin (ROBITUSSIN) 100 MG/5ML liquid Take 15 mLs by mouth every 6 (six) hours as needed for cough or to loosen phlegm. (Patient not taking: Reported on 08/20/2022) 120 mL 0   Current Facility-Administered Medications  Medication Dose Route Frequency Provider Last Rate Last Admin   0.9 %  sodium chloride infusion   Intravenous PRN Margaretha Seeds, MD       Mepolizumab SOLR 100 mg  100 mg Subcutaneous Q28 days Margaretha Seeds, MD   100 mg at 08/31/19 1017    Allergies:   Ferrous bisglycinate chelate [iron], Mushroom extract complex, Na ferric gluc cplx in sucrose, Cymbalta [duloxetine hcl], Ondansetron hcl, Promethazine, Promethazine hcl, Succinylcholine, Buprenorphine hcl, Compazine, Duloxetine, Metoclopramide, Morphine and related, Ondansetron, Promethazine hcl, and Tegaderm ag mesh [silver]    Social History:  The patient  reports that she has never smoked. She has never used smokeless tobacco. She reports that she does not currently use alcohol. She reports that she does not use drugs.   Family History:  The patient's Family history is unknown by patient.    ROS:  Please see the history of present illness.   Physical Exam: Blood pressure 102/68, pulse (!) 122, height _0  (1.651 m), weight 181 lb (82.1 kg), SpO2 98 %.    GEN:    Middle-aged female,   Moderately obese. HEENT: Normal NECK: No JVD; No carotid bruits LYMPHATICS: No lymphadenopathy CARDIAC: RRR , no murmurs, rubs, gallops RESPIRATORY:  Clear to auscultation without rales, wheezing or rhonchi  ABDOMEN: Soft, non-tender, non-distended MUSCULOSKELETAL:  No edema; No deformity  SKIN: Warm and dry NEUROLOGIC:  Alert and oriented x 3   EKG:    Recent Labs: 02/09/2022: TSH 0.370 07/01/2022: ALT 29; B Natriuretic Peptide 63.5 07/06/2022: Hemoglobin 8.0; Platelets 310 07/08/2022: BUN 27; Creatinine, Ser 1.20; Magnesium 2.3; Potassium 4.4; Sodium 136     Lipid Panel    Component Value Date/Time   CHOL 178 09/22/2020 0935   CHOL 204 (H) 01/23/2018 1225   CHOL 207 (H) 12/08/2013 0823   TRIG 253 (H) 09/22/2020 0935   TRIG 96 12/08/2013 0823   HDL 44 09/22/2020 0935   HDL 60 01/23/2018 1225   HDL 57 12/08/2013 0823   CHOLHDL 4.0 09/22/2020 0935   VLDL 51 (H) 09/22/2020 0935   LDLCALC 83 09/22/2020 0935   LDLCALC 107 (H) 01/23/2018 1225   LDLCALC 131 (H) 12/08/2013 0823      Wt Readings from Last 3 Encounters:  08/20/22 181 lb (82.1 kg)  08/15/22 181 lb (82.1 kg)  08/01/22 179 lb (81.2 kg)      Other studies Reviewed: Additional studies/ records that were reviewed today include: . Review of the above records demonstrates:    ASSESSMENT AND PLAN:  -  chest pain / tightness : She has presented with chest tightness and chest fullness.  She has had several normal heart catheterizations in the past.  We discussed possibly doing a coronary CT angiogram but her heart rate is so fast that I do not think that we will be able to get it into the acceptable range.  She has normal left ventricular systolic function so it does not appear that we will be able to use Corlanor on a routine basis.  Have given her prescription for nitroglycerin.  I will set her up  for right and left heart catheterization with the advanced heart failure team.  We we have discussed the risk, benefits, option of heart catheterization.  She understands and agrees to proceed.  -  Right heart failure:     has symptoms of RV failure but the RV size and function appeared normal at her last echo  We will proceed with right heart catheterization in the next week or so.        - Palpitations/premature ventricular contractions -      -  history of Hodgkin's lymphoma-status post mantle radiation   - History of breast cancer-s/p reconstruction    4. History of cervical cancer  5. History of pituitary tumor-  6. History of thyroidectomy -       7.  Raynaud's phenomenon  8. Appendectomy  9. History chest pains- . 10.   history of adrenal insufficiency  -   continue Cortef.   Current medicines are reviewed at length with the patient today.  The patient does not have concerns regarding medicines.  The following changes have been made:  no change  Disposition:    will follow up in 2 months    Signed, Mertie Moores, MD  08/20/2022 3:25 PM    [facility] [address], [place], [place]  [address] Phone: [phone]; Fax: [phone]

## 2022-08-20 ENCOUNTER — Ambulatory Visit: Payer: BC Managed Care – PPO | Attending: Cardiovascular Disease | Admitting: Cardiovascular Disease

## 2022-08-20 ENCOUNTER — Encounter: Payer: Self-pay | Admitting: Cardiovascular Disease

## 2022-08-20 VITALS — BP 102/68 | HR 122 | Ht 65.0 in | Wt 181.0 lb

## 2022-08-20 DIAGNOSIS — Z01810 Encounter for preprocedural cardiovascular examination: Secondary | ICD-10-CM

## 2022-08-20 MED ORDER — BISOPROLOL FUMARATE 5 MG PO TABS: ORAL_TABLET | ORAL | 3 refills | Status: DC

## 2022-08-20 MED ORDER — NITROGLYCERIN 0.4 MG SL SUBL: SUBLINGUAL_TABLET | SUBLINGUAL | 6 refills | Status: DC

## 2022-08-20 NOTE — Patient Instructions (Signed)
Medication Instructions:  REFILLED Nitroglycerin START Bisoprolol 2.34m daily *If you need a refill on your cardiac medications before your next appointment, please call your pharmacy*  Lab Work: BMET, CBC today If you have labs (blood work) drawn today and your tests are completely normal, you will receive your results only by: [facility](if you have MyChart) OR A paper copy in the mail If you have any lab test that is abnormal or we need to change your treatment, we will call you to review the results.  Testing/Procedures: R & L heart Cath Your physician has requested that you have a cardiac catheterization. Cardiac catheterization is used to diagnose and/or treat various heart conditions. Doctors may recommend this procedure for a number of different reasons. The most common reason is to evaluate chest pain. Chest pain can be a symptom of coronary artery disease (CAD), and cardiac catheterization can show whether plaque is narrowing or blocking your heart's arteries. This procedure is also used to evaluate the valves, as well as measure the blood flow and oxygen levels in different parts of your heart. For further information please visit [email or web address] Please follow instruction sheet, as given.  Follow-Up: [facility] and your health needs are our priority.  As part of our continuing mission to provide you with exceptional heart care, we have created designated Provider Care Teams.  These Care Teams include your primary Cardiologist (physician) and Advanced Practice Providers (APPs -  Physician Assistants and Nurse Practitioners) who all work together to provide you with the care you need, when you need it.  Your next appointment:   2 month(s)  The format for your next appointment:   In Person  Provider:   PMertie Moores MD       **Will send cath instructions via MyChart**   Important Information About Sugar

## 2022-08-21 ENCOUNTER — Telehealth: Payer: Self-pay | Admitting: Cardiovascular Disease

## 2022-08-21 LAB — BASIC METABOLIC PANEL
BUN/Creatinine Ratio: 22 (ref 9–23)
BUN: 28 mg/dL — ABNORMAL HIGH (ref 6–24)
CO2: 23 mmol/L (ref 20–29)
Calcium: 9.7 mg/dL (ref 8.7–10.2)
Chloride: 100 mmol/L (ref 96–106)
Creatinine, Ser: 1.3 mg/dL — ABNORMAL HIGH (ref 0.57–1.00)
Glucose: 115 mg/dL — ABNORMAL HIGH (ref 70–99)
Potassium: 4.2 mmol/L (ref 3.5–5.2)
Sodium: 140 mmol/L (ref 134–144)
eGFR: 50 mL/min/{1.73_m2} — ABNORMAL LOW (ref >59)

## 2022-08-21 LAB — CBC
Hematocrit: 34.4 % (ref 34.0–46.6)
Hemoglobin: 10.6 g/dL — ABNORMAL LOW (ref 11.1–15.9)
MCH: 24 pg — ABNORMAL LOW (ref 26.6–33.0)
MCHC: 30.8 g/dL — ABNORMAL LOW (ref 31.5–35.7)
MCV: 78 fL — ABNORMAL LOW (ref 79–97)
Platelets: 414 10*3/uL (ref 150–450)
RBC: 4.41 x10E6/uL (ref 3.77–5.28)
RDW: 16.5 % — ABNORMAL HIGH (ref 11.7–15.4)
WBC: 8.7 10*3/uL (ref 3.4–10.8)

## 2022-08-21 NOTE — Telephone Encounter (Signed)
Pt seen in clinic yesterday 08/21/22 and ordered to have a R & L heart cath by Dr Haroldine Laws. Cath date has not been finalized. Will send note to MD to establish what date we should schedule cath.

## 2022-08-21 NOTE — Telephone Encounter (Signed)
Spoke with pt and notified of results per Dr. Halford Chessman. Pt verbalized understanding and denied any questions. Will keep planned followup.

## 2022-08-26 DIAGNOSIS — J9611 Chronic respiratory failure with hypoxia: Secondary | ICD-10-CM | POA: Diagnosis not present

## 2022-08-27 DIAGNOSIS — G43009 Migraine without aura, not intractable, without status migrainosus: Secondary | ICD-10-CM | POA: Diagnosis not present

## 2022-08-28 ENCOUNTER — Ambulatory Visit: Payer: Medicare Other | Admitting: Psychiatry

## 2022-08-28 NOTE — Progress Notes (Deleted)
[facility] Counselor/Therapist Progress Note  Patient ID: [name], MRN: [id number],    Date: 08/28/2022  Time Spent: ***   Treatment Type: {CHL AMB THERAPY TYPES:[id number]}  Reported Symptoms: ***  Mental Status Exam:  Appearance:   {PSY:22683}     Behavior:  {PSY:21022743}  Motor:  {PSY:22302}  Speech/Language:   {PSY:22685}  Affect:  {PSY:22687}  Mood:  {PSY:31886}  Thought process:  {PSY:31888}  Thought content:    {[phone]}  Sensory/Perceptual disturbances:    {PSY:606-023-7451}  Orientation:  {PSY:30297}  Attention:  {PSY:22877}  Concentration:  {PSY:585-606-0538}  Memory:  {PSY:865-176-7524}  Fund of knowledge:   {PSY:585-606-0538}  Insight:    {PSY:585-606-0538}  Judgment:   {PSY:585-606-0538}  Impulse Control:  {PSY:[phone]}   Risk Assessment: Danger to Self:  {PSY:22692} Self-injurious Behavior: {PSY:22692} Danger to Others: {PSY:22692} Duty to Warn:{PSY:311194} Physical Aggression / Violence:{PSY:21197} Access to Firearms a concern: {PSY:21197} Gang Involvement:{PSY:21197}  Subjective: ***     Interventions: {PSY:408-860-8810}   Diagnosis:No diagnosis found.  Plan: ***   Shanon Ace, LCSW

## 2022-08-28 NOTE — Progress Notes (Deleted)
[facility] MD/PA/NP Initial Note  08/28/2022 2:23 PM [name]  MRN:  [id number]  Chief Complaint:   HPI: ***  Visit Diagnosis: No diagnosis found.  Past Psychiatric History:   Past Medical History:  Past Medical History:  Diagnosis Date   Addison's disease ([place])    Adrenal insufficiency ([place])    Anemia    Anxiety    Aortic stenosis    Aortic stenosis    Appendicitis 12/19/09   Appendicitis    Breast cancer (HCC)    STATUS POST BILATERAL MASTECTOMY. STATUS POST RECONSTRUCTION. SHE HAD SILICONE BREAST IMPLANTS AND THE LEFT IMPLANT IS LEAKING SLIGHTLY   Cellulitis of right middle finger 11/07/2018   Cervical cancer ([place]) 12/23/2018   Chest pain    CHF with right heart failure ([place]) 04/17/2017   Chronic respiratory failure with hypoxia (HCC) 12/23/2018   Cough variant asthma 04/13/2019   Depression    GERD (gastroesophageal reflux disease)    takes Dexilant and carafate and gi coctail    Headache    migraines on a daily and monthly regimen    Heart murmur    History of kidney stones    Hodgkin lymphoma ([place])    STATUS POST MANTLE RADIATION   Hodgkin's lymphoma ([place])    1987   Hypertension    Hypoxia    Necrotizing fasciitis ([place]) 12/23/2018   Non-ischemic cardiomyopathy ([place])    Osteoporosis    Palpitations    Pituitary adenoma ([place]) 12/23/2018   Pneumonia    PONV (postoperative nausea and vomiting)    Pre-diabetes    per pt; no meds   Pulmonary hypertension ([place]) 12/23/2018   Raynaud phenomenon    Right heart failure ([place]) 04/17/2017   Seizures ([place])    last febrile seizure was approx 3 weeks ago per report on 12/01/2020   Sleep apnea    upcoming sleep study per pt    Supplemental oxygen dependent    3 liters   SVT (supraventricular tachycardia)    Tachycardia    Thyroid cancer ([place])    STATUS POST SURGICAL REMOVAL-CURRENT ON THYROID REPLACEMENT    Past Surgical History:  Procedure Laterality Date   ABDOMINAL HYSTERECTOMY     AMPUTATION Left 01/30/2019    Procedure: Left Index finger amputation with flap reconstruction and repair reconstruction;  Surgeon: Roseanne Kaufman, MD;  Location: [facility];  Service: Orthopedics;  Laterality: Left;   APPENDECTOMY     breast implants and removal      breast implants but leaking      CARDIAC CATHETERIZATION  05/18/09   NORMAL CATH   COLONOSCOPY     hx of chemotherapy      hx of radiation therapy      I & D EXTREMITY Left 12/23/2018   Procedure: IRRIGATION AND DEBRIDEMENT HAND / INDEX FINGER;  Surgeon: Roseanne Kaufman, MD;  Location: [facility];  Service: Orthopedics;  Laterality: Left;   IR CV LINE INJECTION  03/22/2022   IR IMAGING GUIDED PORT INSERTION  05/06/2020   IR IMAGING GUIDED PORT INSERTION  12/04/2021   IR REMOVAL TUN ACCESS W/ PORT W/O FL MOD SED  04/27/2020   IR REMOVAL TUN ACCESS W/ PORT W/O FL MOD SED  12/04/2021   IR REMOVAL TUN ACCESS W/ PORT W/O FL MOD SED  03/30/2022   KIDNEY STONE SURGERY     LUMBAR PUNCTURE W/ INTRATHECAL CHEMOTHERAPY     MASTECTOMY     PITUITARY SURGERY     RIGHT/LEFT HEART CATH AND  CORONARY ANGIOGRAPHY N/A 04/02/2018   Procedure: RIGHT/LEFT HEART CATH AND CORONARY ANGIOGRAPHY;  Surgeon: Burnell Blanks, MD;  Location: [facility];  Service: Cardiovascular;  Laterality: N/A;   TOOTH EXTRACTION N/A 12/05/2020   Procedure: DENTAL RESTORATION/EXTRACTIONS;  Surgeon: Ronal Fear, MD;  Location: [facility];  Service: Oral Surgery;  Laterality: N/A;   TOTAL THYROIDECTOMY     VIDEO BRONCHOSCOPY Bilateral 11/14/2018   Procedure: VIDEO BRONCHOSCOPY WITHOUT FLUORO;  Surgeon: Margaretha Seeds, MD;  Location: [facility];  Service: Cardiopulmonary;  Laterality: Bilateral;   VIDEO BRONCHOSCOPY WITH ENDOBRONCHIAL ULTRASOUND N/A 11/19/2018   Procedure: VIDEO BRONCHOSCOPY WITH ENDOBRONCHIAL ULTRASOUND;  Surgeon: Margaretha Seeds, MD;  Location: [facility];  Service: Thoracic;  Laterality: N/A;    Family Psychiatric History: ***  Family History:  Family History  Family history  unknown: Yes    Social History:  Social History   Socioeconomic History   Marital status: Married    Spouse name: Not on file   Number of children: Not on file   Years of education: Not on file   Highest education level: Not on file  Occupational History   Not on file  Tobacco Use   Smoking status: Never   Smokeless tobacco: Never  Vaping Use   Vaping Use: Never used  Substance and Sexual Activity   Alcohol use: Not Currently    Comment: social    Drug use: No   Sexual activity: Yes    Birth control/protection: Surgical  Other Topics Concern   Not on file  Social History Narrative   Not on file   Social Determinants of Health   Financial Resource Strain: Not on file  Food Insecurity: Not on file  Transportation Needs: Not on file  Physical Activity: Not on file  Stress: Not on file  Social Connections: Not on file    Allergies:  Allergies  Allergen Reactions   Ferrous Bisglycinate Chelate [Iron] Anaphylaxis and Other (See Comments)    Only IV - only FERRLICET   Mushroom Extract Complex Anaphylaxis   Na Ferric Gluc Cplx In Sucrose Anaphylaxis   Cymbalta [Duloxetine Hcl] Swelling and Anxiety   Ondansetron Hcl Other (See Comments)   Promethazine Other (See Comments)    Other reaction(s): Unknown   Promethazine Hcl     Other reaction(s): Unknown   Succinylcholine Other (See Comments)    Lock Jaw   Buprenorphine Hcl Hives   Compazine Other (See Comments)    Altered mental status Aggression   Duloxetine     Other reaction(s): Not available   Metoclopramide Other (See Comments)    Dystonia   Morphine And Related Hives   Ondansetron Hives and Rash    Other reaction(s): Unknown Other reaction(s): Unknown   Promethazine Hcl Hives   Tegaderm Ag Mesh [Silver] Rash    Old formulation only, is able to tolerate new formulation    Metabolic Disorder Labs: Lab Results  Component Value Date   HGBA1C 6.3 (H) 02/09/2022   MPG 134.11 02/09/2022   MPG 148.46  12/05/2020   No results found for: "PROLACTIN" Lab Results  Component Value Date   CHOL 178 09/22/2020   TRIG 253 (H) 09/22/2020   HDL 44 09/22/2020   CHOLHDL 4.0 09/22/2020   VLDL 51 (H) 09/22/2020   LDLCALC 83 09/22/2020   LDLCALC 107 (H) 01/23/2018   Lab Results  Component Value Date   TSH 0.370 02/09/2022   TSH 1.210 04/17/2021    Therapeutic Level Labs: No  results found for: "LITHIUM" No results found for: "VALPROATE" No results found for: "CBMZ"  Current Medications: Current Outpatient Medications  Medication Sig Dispense Refill   albuterol (PROVENTIL) (2.5 MG/3ML) 0.083% nebulizer solution Take 3 mLs (2.5 mg total) by nebulization every 6 (six) hours as needed for wheezing or shortness of breath. 360 mL 2   amoxicillin-clavulanate (AUGMENTIN) 875-125 MG tablet Take 1 tablet by mouth 2 (two) times daily. (Patient not taking: Reported on 08/15/2022) 10 tablet 0   aspirin EC 81 MG tablet Take 1 tablet (81 mg total) by mouth daily. Swallow whole. 90 tablet 3   bisoprolol (ZEBETA) 5 MG tablet Take 0.5 tablet by mouth daily 45 tablet 3   bumetanide (BUMEX) 1 MG tablet Take 1 tablet (1 mg total) by mouth 2 (two) times daily. 60 tablet 0   buPROPion (WELLBUTRIN XL) 150 MG 24 hr tablet TAKE 1 TABLET BY MOUTH EVERY DAY 90 tablet 0   clonazePAM (KLONOPIN) 0.5 MG tablet Take 1 tablet (0.5 mg total) by mouth 3 (three) times daily as needed for anxiety. 90 tablet 2   dexlansoprazole (DEXILANT) 60 MG capsule Take 60 mg by mouth daily.     Diclofenac Potassium,Migraine, (CAMBIA) 50 MG PACK Take 50 mg by mouth daily as needed for migraine.     diphenhydrAMINE (BENADRYL) 50 MG/ML injection Inject 25 mg into the muscle once as needed (for nausea).     dronabinol (MARINOL) 2.5 MG capsule TAKE 1 CAPSULE (2.5 MG TOTAL) BY MOUTH 2 (TWO) TIMES DAILY AS NEEDED (NAUSEA). 30 capsule 0   EMGALITY 120 MG/ML SOSY Inject 120 mg into the skin every 28 (twenty-eight) days. (Patient not taking: Reported  on 08/15/2022)     EPINEPHrine 0.3 mg/0.3 mL IJ SOAJ injection Inject 0.3 mg into the muscle as needed for anaphylaxis. 2 each 2   escitalopram (LEXAPRO) 20 MG tablet Take 1 tablet (20 mg total) by mouth every evening. 90 tablet 3   FLORASTOR 250 MG capsule Take 250 mg by mouth See admin instructions. Take 250 mg by mouth mid-morning and mid-afternoon     fluticasone-salmeterol (ADVAIR HFA) 230-21 MCG/ACT inhaler INHALE 2 PUFFS INTO THE LUNGS TWICE A DAY 12 each 5   guaiFENesin (ROBITUSSIN) 100 MG/5ML liquid Take 15 mLs by mouth every 6 (six) hours as needed for cough or to loosen phlegm. (Patient not taking: Reported on 08/20/2022) 120 mL 0   hydrocortisone (CORTEF) 10 MG tablet Take 1-2 tablets (10-20 mg total) by mouth See admin instructions. Take 20 mg in the am and 80m in the evening. Take after completing prednisone     lamoTRIgine (LAMICTAL) 150 MG tablet Take one tablet at bedtime. 90 tablet 1   Lancets (ONETOUCH DELICA PLUS [id number] MISC 3 (three) times daily. for testing     montelukast (SINGULAIR) 10 MG tablet Take 1 tablet (10 mg total) by mouth at bedtime. 30 tablet 2   nitroGLYCERIN (NITROSTAT) 0.4 MG SL tablet Dissolve 1 tablet under the tongue every 5 minutes as needed for chest pain. Max of 3 doses, then 911. 25 tablet 6   ONETOUCH VERIO test strip 3 (three) times daily. for testing     OXYGEN Inhale 3-4 L/min into the lungs continuous.     PROAIR HFA 108 (90 Base) MCG/ACT inhaler Inhale 2 puffs into the lungs every 4 (four) hours as needed for wheezing or shortness of breath.     Rimegepant Sulfate (NURTEC) 75 MG TBDP Take 75 mg by mouth daily as needed (Migraine).  rosuvastatin (CRESTOR) 10 MG tablet Take 10 mg by mouth See admin instructions. Take 10 mg by mouth mid-morning every day     sacubitril-valsartan (ENTRESTO) 24-26 MG Take 1 tablet by mouth 2 (two) times daily. 60 tablet 11   sitaGLIPtin (JANUVIA) 100 MG tablet Take 1 tablet (100 mg total) by mouth daily.      sucralfate (CARAFATE) 1 GM/10ML suspension Take 1 g by mouth daily as needed (as directed for ulcers).     SYNTHROID 100 MCG tablet Take 1 tablet (100 mcg total) by mouth every morning.     Tiotropium Bromide Monohydrate (SPIRIVA RESPIMAT) 1.25 MCG/ACT AERS Inhale 2 puffs into the lungs daily. 4 g 5   topiramate (TOPAMAX) 50 MG tablet Take 150 mg by mouth at bedtime.      umeclidinium bromide (INCRUSE ELLIPTA) 62.5 MCG/ACT AEPB Inhale 1 puff into the lungs daily. 30 each 2   zolpidem (AMBIEN CR) 12.5 MG CR tablet Take 12.5 mg by mouth at bedtime.     Current Facility-Administered Medications  Medication Dose Route Frequency Provider Last Rate Last Admin   0.9 %  sodium chloride infusion   Intravenous PRN Margaretha Seeds, MD       Mepolizumab SOLR 100 mg  100 mg Subcutaneous Q28 days Margaretha Seeds, MD   100 mg at 08/31/19 1017    Medication Side Effects: {Medication Side Effects (Optional):12147}  Orders placed this visit:  No orders of the defined types were placed in this encounter.   Psychiatric Specialty Exam:  Review of Systems  There were no vitals taken for this visit.There is no height or weight on file to calculate BMI.  General Appearance: {PSY:[id number]}  Eye Contact:  {PSY:22684}  Speech:  {PSY:[id number]}  Volume:  {PSY:22686}  Mood:  {PSY:22306}  Affect:  {PSY:[id number]}  Thought Process:  {PSY:22688}  Orientation:  {PSY:22689}  Thought Content: {PSY:22690}   Suicidal Thoughts:  {PSY:22692}  Homicidal Thoughts:  {PSY:22692}  Memory:  {[phone]}  Judgement:  {PSY:22694}  Insight:  {PSY:22695}  Psychomotor Activity:  {PSY:22696}  Concentration:  {PSY:21399}  Recall:  {PSY:22877}  Fund of Knowledge: {PSY:22877}  Language: {PPU:68166}  Assets:  {PSY:22698}  ADL's:  {PSY:22290}  Cognition: {TPE:[id number]}  Prognosis:  {PSY:22877}   Screenings:  PHQ2-9    [place] Office Visit from 06/14/2022 in [facility] for [facility]ious  D[facility] Office Visit from 03/02/2022 in [facility] for [facility] Office Visit from 11/22/2021 in [facility] for [facility]ious Disease Office Visit from 11/16/2021 in [facility] for [facility] Disease  PHQ-2 Total Score _0 PHQ-9 Total Score -- _1 Flowsheet Row ED to Hosp-Admission (Discharged) from 07/01/2022 in [facility] ED to Hosp-Admission (Discharged) from 04/05/2022 in [facility] ED to Hosp-Admission (Discharged) from 03/28/2022 in [facility] No Risk No Risk No Risk       Receiving Psychotherapy: {TVT:82429}  Treatment Plan/Recommendations: ***    Shanon Ace, LCSW

## 2022-08-29 NOTE — Telephone Encounter (Signed)
Called and spoke with patient and she is fine with cath this Friday, 08/31/22 at 7:30. Cath instructions sent to patient via MyChart. Labs done 08/20/22. Informed patient that all her labs were stable, but they may wish to repeat them at the hospital that morning d/t history of anemia and kidney functioning. Patient understands.

## 2022-08-30 ENCOUNTER — Telehealth: Payer: Self-pay | Admitting: *Deleted

## 2022-08-30 NOTE — Telephone Encounter (Addendum)
Due to her Addison's Disease, patient reports  she should be given Hydrocortisone (Cortef) 100 mg IV just prior to [facility] 08/31/22.

## 2022-08-30 NOTE — Telephone Encounter (Signed)
Cardiac Catheterization scheduled at [facility] for: Friday August 31, 2022 7:30 AM Arrival time and place: [place] Entrance A at: 5:30 AM  Nothing to eat after midnight prior to procedure, clear liquids until 5 AM day of procedure.  Medication instructions: -Hold:  Entresto-day before and day of procedure- per protocol GFR 50  Bumex-PM prior /AM of procedure per protocol GFR 50  Jardiance -AM of procedure  -Except hold medications usual morning medications can be taken with sips of water including aspirin 81 mg.  Confirmed patient has responsible adult to drive home post procedure and be with patient first 24 hours after arriving home.  Patient reports no new symptoms concerning for COVID-19 in the past 10 days.  Reviewed procedure instructions with patient.

## 2022-08-31 ENCOUNTER — Encounter (HOSPITAL_COMMUNITY)
Admission: RE | Disposition: A | Discharge status: Home / Self Care | Payer: Self-pay | Source: Home / Self Care | Attending: Internal Medicine

## 2022-08-31 ENCOUNTER — Ambulatory Visit (HOSPITAL_COMMUNITY)
Admission: RE | Admit: 2022-08-31 | Discharge: 2022-08-31 | Disposition: A | Discharge status: Home / Self Care | Payer: BC Managed Care – PPO | Attending: Internal Medicine | Admitting: Internal Medicine

## 2022-08-31 ENCOUNTER — Other Ambulatory Visit: Payer: Self-pay

## 2022-08-31 ENCOUNTER — Encounter (HOSPITAL_COMMUNITY): Payer: Self-pay | Admitting: Internal Medicine

## 2022-08-31 DIAGNOSIS — R079 Chest pain, unspecified: Secondary | ICD-10-CM | POA: Diagnosis not present

## 2022-08-31 DIAGNOSIS — R0602 Shortness of breath: Secondary | ICD-10-CM | POA: Insufficient documentation

## 2022-08-31 DIAGNOSIS — I493 Ventricular premature depolarization: Secondary | ICD-10-CM | POA: Insufficient documentation

## 2022-08-31 DIAGNOSIS — Z853 Personal history of malignant neoplasm of breast: Secondary | ICD-10-CM | POA: Insufficient documentation

## 2022-08-31 DIAGNOSIS — I350 Nonrheumatic aortic (valve) stenosis: Secondary | ICD-10-CM | POA: Insufficient documentation

## 2022-08-31 DIAGNOSIS — I209 Angina pectoris, unspecified: Secondary | ICD-10-CM

## 2022-08-31 DIAGNOSIS — E785 Hyperlipidemia, unspecified: Secondary | ICD-10-CM | POA: Insufficient documentation

## 2022-08-31 DIAGNOSIS — I7300 Raynaud's syndrome without gangrene: Secondary | ICD-10-CM | POA: Diagnosis not present

## 2022-08-31 DIAGNOSIS — R002 Palpitations: Secondary | ICD-10-CM | POA: Diagnosis not present

## 2022-08-31 DIAGNOSIS — Z923 Personal history of irradiation: Secondary | ICD-10-CM | POA: Diagnosis not present

## 2022-08-31 HISTORY — PX: RIGHT/LEFT HEART CATH AND CORONARY ANGIOGRAPHY: CATH118266

## 2022-08-31 LAB — POCT I-STAT 7, (LYTES, BLD GAS, ICA,H+H)
Acid-base deficit: 2 mmol/L (ref 0.0–2.0)
Bicarbonate: 23.8 mmol/L (ref 20.0–28.0)
Calcium, Ion: 0.99 mmol/L — ABNORMAL LOW (ref 1.15–1.40)
HCT: 27 % — ABNORMAL LOW (ref 36.0–46.0)
Hemoglobin: 9.2 g/dL — ABNORMAL LOW (ref 12.0–15.0)
O2 Saturation: 99 %
Potassium: 3 mmol/L — ABNORMAL LOW (ref 3.5–5.1)
Sodium: 143 mmol/L (ref 135–145)
TCO2: 25 mmol/L (ref 22–32)
pCO2 arterial: 41.6 mmHg (ref 32–48)
pH, Arterial: 7.365 (ref 7.35–7.45)
pO2, Arterial: 141 mmHg — ABNORMAL HIGH (ref 83–108)

## 2022-08-31 LAB — POCT I-STAT EG7
Acid-Base Excess: 1 mmol/L (ref 0.0–2.0)
Acid-Base Excess: 1 mmol/L (ref 0.0–2.0)
Bicarbonate: 27.6 mmol/L (ref 20.0–28.0)
Bicarbonate: 26.8 mmol/L (ref 20.0–28.0)
Calcium, Ion: 1.18 mmol/L (ref 1.15–1.40)
Calcium, Ion: 1.16 mmol/L (ref 1.15–1.40)
HCT: 30 % — ABNORMAL LOW (ref 36.0–46.0)
HCT: 29 % — ABNORMAL LOW (ref 36.0–46.0)
Hemoglobin: 10.2 g/dL — ABNORMAL LOW (ref 12.0–15.0)
Hemoglobin: 9.9 g/dL — ABNORMAL LOW (ref 12.0–15.0)
O2 Saturation: 71 %
O2 Saturation: 71 %
Potassium: 3.5 mmol/L (ref 3.5–5.1)
Potassium: 3.4 mmol/L — ABNORMAL LOW (ref 3.5–5.1)
Sodium: 139 mmol/L (ref 135–145)
Sodium: 140 mmol/L (ref 135–145)
TCO2: 29 mmol/L (ref 22–32)
TCO2: 28 mmol/L (ref 22–32)
pCO2, Ven: 50 mmHg (ref 44–60)
pCO2, Ven: 48.8 mmHg (ref 44–60)
pH, Ven: 7.35 (ref 7.25–7.43)
pH, Ven: 7.348 (ref 7.25–7.43)
pO2, Ven: 40 mmHg (ref 32–45)
pO2, Ven: 40 mmHg (ref 32–45)

## 2022-08-31 SURGERY — RIGHT/LEFT HEART CATH AND CORONARY ANGIOGRAPHY
Anesthesia: LOCAL

## 2022-08-31 MED ORDER — FENTANYL CITRATE (PF) 100 MCG/2ML IJ SOLN
INTRAMUSCULAR | Status: AC
  Filled 2022-08-31: qty 2

## 2022-08-31 MED ORDER — HYDROCORTISONE SOD SUC (PF) 100 MG IJ SOLR: 100.0000 mg | Freq: Once | INTRAMUSCULAR | Status: DC

## 2022-08-31 MED ORDER — LABETALOL HCL 5 MG/ML IV SOLN: 10.0000 mg | INTRAVENOUS | Status: DC | PRN

## 2022-08-31 MED ORDER — VERAPAMIL HCL 2.5 MG/ML IV SOLN
INTRAVENOUS | Status: DC | PRN
  Administered 2022-08-31: 10 mL via INTRA_ARTERIAL

## 2022-08-31 MED ORDER — FENTANYL CITRATE (PF) 100 MCG/2ML IJ SOLN
INTRAMUSCULAR | Status: DC | PRN
  Administered 2022-08-31: 25 ug via INTRAVENOUS
  Administered 2022-08-31: 50 ug via INTRAVENOUS

## 2022-08-31 MED ORDER — SODIUM CHLORIDE 0.9 % IV SOLN: INTRAVENOUS | Status: DC

## 2022-08-31 MED ORDER — SODIUM CHLORIDE 0.9 % IV SOLN: 250.0000 mL | INTRAVENOUS | Status: DC | PRN

## 2022-08-31 MED ORDER — VERAPAMIL HCL 2.5 MG/ML IV SOLN
INTRAVENOUS | Status: AC
  Filled 2022-08-31: qty 2

## 2022-08-31 MED ORDER — HYDROCORTISONE SOD SUC (PF) 100 MG IJ SOLR
INTRAMUSCULAR | Status: DC | PRN
  Administered 2022-08-31: 100 mg via INTRAVENOUS

## 2022-08-31 MED ORDER — LIDOCAINE HCL (PF) 1 % IJ SOLN
INTRAMUSCULAR | Status: AC
  Filled 2022-08-31: qty 30

## 2022-08-31 MED ORDER — HEPARIN SODIUM (PORCINE) 1000 UNIT/ML IJ SOLN
INTRAMUSCULAR | Status: DC | PRN
  Administered 2022-08-31: 4000 [IU] via INTRAVENOUS

## 2022-08-31 MED ORDER — HYDROCORTISONE SOD SUC (PF) 100 MG IJ SOLR
100.0000 mg | INTRAMUSCULAR | Status: AC
  Filled 2022-08-31: qty 2

## 2022-08-31 MED ORDER — MIDAZOLAM HCL 2 MG/2ML IJ SOLN
INTRAMUSCULAR | Status: DC | PRN
  Administered 2022-08-31 (×2): 1 mg via INTRAVENOUS
  Administered 2022-08-31: 2 mg via INTRAVENOUS

## 2022-08-31 MED ORDER — SODIUM CHLORIDE 0.9% FLUSH: 3.0000 mL | Freq: Two times a day (BID) | INTRAVENOUS | Status: DC

## 2022-08-31 MED ORDER — MIDAZOLAM HCL 2 MG/2ML IJ SOLN
INTRAMUSCULAR | Status: AC
  Filled 2022-08-31: qty 2

## 2022-08-31 MED ORDER — HYDRALAZINE HCL 20 MG/ML IJ SOLN: 10.0000 mg | INTRAMUSCULAR | Status: DC | PRN

## 2022-08-31 MED ORDER — SODIUM CHLORIDE 0.9 % IV SOLN
INTRAVENOUS | Status: DC | PRN
  Administered 2022-08-31: 10 mL/h via INTRAVENOUS

## 2022-08-31 MED ORDER — IOHEXOL 350 MG/ML SOLN
INTRAVENOUS | Status: DC | PRN
  Administered 2022-08-31: 45 mL

## 2022-08-31 MED ORDER — HEPARIN (PORCINE) IN NACL 1000-0.9 UT/500ML-% IV SOLN
INTRAVENOUS | Status: DC | PRN
  Administered 2022-08-31 (×2): 500 mL

## 2022-08-31 MED ORDER — HEPARIN SODIUM (PORCINE) 1000 UNIT/ML IJ SOLN
INTRAMUSCULAR | Status: AC
  Filled 2022-08-31: qty 10

## 2022-08-31 MED ORDER — SODIUM CHLORIDE 0.9% FLUSH: 3.0000 mL | INTRAVENOUS | Status: DC | PRN

## 2022-08-31 MED ORDER — DIPHENHYDRAMINE HCL 50 MG/ML IJ SOLN
INTRAMUSCULAR | Status: AC
  Filled 2022-08-31: qty 1

## 2022-08-31 MED ORDER — HEPARIN (PORCINE) IN NACL 1000-0.9 UT/500ML-% IV SOLN
INTRAVENOUS | Status: AC
  Filled 2022-08-31: qty 500

## 2022-08-31 MED ORDER — TRAMADOL HCL 50 MG PO TABS
100.0000 mg | ORAL_TABLET | Freq: Once | ORAL | Status: AC
  Administered 2022-08-31: 100 mg via ORAL
  Filled 2022-08-31: qty 2

## 2022-08-31 MED ORDER — DIPHENHYDRAMINE HCL 50 MG/ML IJ SOLN
INTRAMUSCULAR | Status: DC | PRN
  Administered 2022-08-31: 50 mg via INTRAVENOUS

## 2022-08-31 MED ORDER — ACETAMINOPHEN 325 MG PO TABS: 650.0000 mg | ORAL_TABLET | ORAL | Status: DC | PRN

## 2022-08-31 MED ORDER — ASPIRIN 81 MG PO CHEW: 81.0000 mg | CHEWABLE_TABLET | ORAL | Status: DC

## 2022-08-31 MED ORDER — LIDOCAINE HCL (PF) 1 % IJ SOLN
INTRAMUSCULAR | Status: DC | PRN
  Administered 2022-08-31 (×2): 2 mL

## 2022-08-31 MED ORDER — ONDANSETRON HCL 4 MG/2ML IJ SOLN: 4.0000 mg | Freq: Four times a day (QID) | INTRAMUSCULAR | Status: DC | PRN

## 2022-08-31 SURGICAL SUPPLY — 15 items
CATH 5FR JL3.5 JR4 ANG PIG MP (CATHETERS) IMPLANT
CATH BALLN WEDGE 5F 110CM (CATHETERS) IMPLANT
CATH INFINITI 5FR AL1 (CATHETERS) IMPLANT
CATH LAUNCHER 5F JL3 (CATHETERS) IMPLANT
CATHETER LAUNCHER 5F JL3 (CATHETERS) ×1
DEVICE RAD COMP TR BAND LRG (VASCULAR PRODUCTS) IMPLANT
GLIDESHEATH SLEND SS 6F .021 (SHEATH) IMPLANT
GUIDEWIRE .025 260CM (WIRE) IMPLANT
GUIDEWIRE INQWIRE 1.5J.035X260 (WIRE) IMPLANT
INQWIRE 1.5J .035X260CM (WIRE) ×1
KIT MICROPUNCTURE NIT STIFF (SHEATH) IMPLANT
PACK CARDIAC CATHETERIZATION (CUSTOM PROCEDURE TRAY) ×1 IMPLANT
SHEATH GLIDE SLENDER 4/5FR (SHEATH) IMPLANT
SHEATH PROBE COVER 6X72 (BAG) IMPLANT
TRANSDUCER W/STOPCOCK (MISCELLANEOUS) ×1 IMPLANT

## 2022-08-31 NOTE — Interval H&P Note (Signed)
History and Physical Interval Note:  08/31/2022 8:06 AM  [name]  has presented today for surgery, with the diagnosis of angina.  The various methods of treatment have been discussed with the patient and family. After consideration of risks, benefits and other options for treatment, the patient has consented to  Procedure(s): RIGHT/LEFT HEART CATH AND CORONARY ANGIOGRAPHY (N/A) as a surgical intervention.  The patient's history has been reviewed, patient examined, no change in status, stable for surgery.  I have reviewed the patient's chart and labs.  Questions were answered to the patient's satisfaction.     Tenya Araque

## 2022-08-31 NOTE — Progress Notes (Signed)
Patient not seen this date. Medical issue.

## 2022-08-31 NOTE — Progress Notes (Signed)
Client refused 2nd IV

## 2022-09-04 ENCOUNTER — Other Ambulatory Visit: Payer: Self-pay | Admitting: Cardiovascular Disease

## 2022-09-04 NOTE — Telephone Encounter (Signed)
See pt email from 11/21. Will close encounter.

## 2022-09-06 NOTE — Addendum Note (Signed)
Addended by: Rodman Pickle on: 09/06/2022 10:48 AM   Modules accepted: Level of Service

## 2022-09-07 ENCOUNTER — Other Ambulatory Visit: Payer: Self-pay | Admitting: Hematology and Oncology

## 2022-09-07 ENCOUNTER — Encounter (HOSPITAL_BASED_OUTPATIENT_CLINIC_OR_DEPARTMENT_OTHER): Payer: Self-pay | Admitting: Pulmonary Disease

## 2022-09-07 ENCOUNTER — Telehealth: Payer: Self-pay | Admitting: Pulmonary Disease

## 2022-09-07 NOTE — Telephone Encounter (Signed)
We have been working on approving her on the chest vest including discussions with the smart vest therapy reps and unfortunately have not been successful with her listed diagnoses.  Please let patient aware that we are working on it.

## 2022-09-07 NOTE — Telephone Encounter (Signed)
Called paitent and she states that she is wanting an order for a vest. She states it will help her with her mucus production. I advised her that I would have to check with Dr Loanne Drilling first.   Dr Loanne Drilling are you wanting to move forward with vest therapy.   Her diagnosis is asthma.

## 2022-09-10 NOTE — Telephone Encounter (Signed)
See phone note from 12/8.

## 2022-09-10 NOTE — Addendum Note (Signed)
Addended by: Rodman Pickle on: 09/10/2022 04:48 PM   Modules accepted: Orders

## 2022-09-12 ENCOUNTER — Encounter: Payer: Self-pay | Admitting: *Deleted

## 2022-09-12 ENCOUNTER — Other Ambulatory Visit: Payer: Self-pay | Admitting: Hematology and Oncology

## 2022-09-12 NOTE — Progress Notes (Signed)
Per MD request, pt will need to obtain all future medication refills from PCP. Pt educated and verbalized understanding.

## 2022-09-13 ENCOUNTER — Other Ambulatory Visit: Payer: Self-pay

## 2022-09-13 ENCOUNTER — Inpatient Hospital Stay (HOSPITAL_COMMUNITY)
Admission: EM | Admit: 2022-09-13 | Discharge: 2022-09-17 | DRG: 872 | Disposition: A | Discharge status: Home / Self Care | Payer: BC Managed Care – PPO | Attending: Internal Medicine | Admitting: Internal Medicine

## 2022-09-13 ENCOUNTER — Encounter (HOSPITAL_COMMUNITY): Payer: Self-pay

## 2022-09-13 ENCOUNTER — Emergency Department (HOSPITAL_COMMUNITY): Payer: BC Managed Care – PPO

## 2022-09-13 DIAGNOSIS — E272 Addisonian crisis: Secondary | ICD-10-CM | POA: Diagnosis present

## 2022-09-13 DIAGNOSIS — Z9221 Personal history of antineoplastic chemotherapy: Secondary | ICD-10-CM

## 2022-09-13 DIAGNOSIS — D649 Anemia, unspecified: Secondary | ICD-10-CM | POA: Diagnosis not present

## 2022-09-13 DIAGNOSIS — I428 Other cardiomyopathies: Secondary | ICD-10-CM | POA: Diagnosis not present

## 2022-09-13 DIAGNOSIS — I471 Supraventricular tachycardia, unspecified: Secondary | ICD-10-CM | POA: Diagnosis present

## 2022-09-13 DIAGNOSIS — I110 Hypertensive heart disease with heart failure: Secondary | ICD-10-CM | POA: Diagnosis not present

## 2022-09-13 DIAGNOSIS — R6520 Severe sepsis without septic shock: Secondary | ICD-10-CM | POA: Diagnosis present

## 2022-09-13 DIAGNOSIS — I2729 Other secondary pulmonary hypertension: Secondary | ICD-10-CM | POA: Diagnosis not present

## 2022-09-13 DIAGNOSIS — Z8571 Personal history of Hodgkin lymphoma: Secondary | ICD-10-CM

## 2022-09-13 DIAGNOSIS — L0390 Cellulitis, unspecified: Secondary | ICD-10-CM | POA: Diagnosis not present

## 2022-09-13 DIAGNOSIS — E890 Postprocedural hypothyroidism: Secondary | ICD-10-CM | POA: Diagnosis present

## 2022-09-13 DIAGNOSIS — J9611 Chronic respiratory failure with hypoxia: Secondary | ICD-10-CM | POA: Diagnosis not present

## 2022-09-13 DIAGNOSIS — G4733 Obstructive sleep apnea (adult) (pediatric): Secondary | ICD-10-CM | POA: Diagnosis present

## 2022-09-13 DIAGNOSIS — Z8541 Personal history of malignant neoplasm of cervix uteri: Secondary | ICD-10-CM

## 2022-09-13 DIAGNOSIS — E785 Hyperlipidemia, unspecified: Secondary | ICD-10-CM | POA: Diagnosis present

## 2022-09-13 DIAGNOSIS — I5082 Biventricular heart failure: Secondary | ICD-10-CM | POA: Diagnosis present

## 2022-09-13 DIAGNOSIS — Z1152 Encounter for screening for COVID-19: Secondary | ICD-10-CM

## 2022-09-13 DIAGNOSIS — Z7984 Long term (current) use of oral hypoglycemic drugs: Secondary | ICD-10-CM

## 2022-09-13 DIAGNOSIS — Z9013 Acquired absence of bilateral breasts and nipples: Secondary | ICD-10-CM

## 2022-09-13 DIAGNOSIS — I5032 Chronic diastolic (congestive) heart failure: Secondary | ICD-10-CM | POA: Diagnosis present

## 2022-09-13 DIAGNOSIS — Z885 Allergy status to narcotic agent status: Secondary | ICD-10-CM

## 2022-09-13 DIAGNOSIS — M810 Age-related osteoporosis without current pathological fracture: Secondary | ICD-10-CM | POA: Diagnosis present

## 2022-09-13 DIAGNOSIS — Z8701 Personal history of pneumonia (recurrent): Secondary | ICD-10-CM

## 2022-09-13 DIAGNOSIS — I10 Essential (primary) hypertension: Secondary | ICD-10-CM | POA: Diagnosis not present

## 2022-09-13 DIAGNOSIS — G8929 Other chronic pain: Secondary | ICD-10-CM | POA: Diagnosis present

## 2022-09-13 DIAGNOSIS — A419 Sepsis, unspecified organism: Principal | ICD-10-CM | POA: Diagnosis present

## 2022-09-13 DIAGNOSIS — F419 Anxiety disorder, unspecified: Secondary | ICD-10-CM | POA: Diagnosis present

## 2022-09-13 DIAGNOSIS — R569 Unspecified convulsions: Secondary | ICD-10-CM

## 2022-09-13 DIAGNOSIS — Z85850 Personal history of malignant neoplasm of thyroid: Secondary | ICD-10-CM

## 2022-09-13 DIAGNOSIS — Z7982 Long term (current) use of aspirin: Secondary | ICD-10-CM

## 2022-09-13 DIAGNOSIS — Z9981 Dependence on supplemental oxygen: Secondary | ICD-10-CM

## 2022-09-13 DIAGNOSIS — G43909 Migraine, unspecified, not intractable, without status migrainosus: Secondary | ICD-10-CM | POA: Diagnosis present

## 2022-09-13 DIAGNOSIS — R7303 Prediabetes: Secondary | ICD-10-CM | POA: Diagnosis present

## 2022-09-13 DIAGNOSIS — Z90710 Acquired absence of both cervix and uterus: Secondary | ICD-10-CM

## 2022-09-13 DIAGNOSIS — L03114 Cellulitis of left upper limb: Secondary | ICD-10-CM | POA: Diagnosis not present

## 2022-09-13 DIAGNOSIS — Z6830 Body mass index (BMI) 30.0-30.9, adult: Secondary | ICD-10-CM

## 2022-09-13 DIAGNOSIS — M7989 Other specified soft tissue disorders: Secondary | ICD-10-CM | POA: Diagnosis not present

## 2022-09-13 DIAGNOSIS — F32A Depression, unspecified: Secondary | ICD-10-CM | POA: Diagnosis not present

## 2022-09-13 DIAGNOSIS — J4540 Moderate persistent asthma, uncomplicated: Secondary | ICD-10-CM | POA: Diagnosis not present

## 2022-09-13 DIAGNOSIS — K219 Gastro-esophageal reflux disease without esophagitis: Secondary | ICD-10-CM | POA: Diagnosis present

## 2022-09-13 DIAGNOSIS — Z91018 Allergy to other foods: Secondary | ICD-10-CM

## 2022-09-13 DIAGNOSIS — Z79899 Other long term (current) drug therapy: Secondary | ICD-10-CM

## 2022-09-13 DIAGNOSIS — Z853 Personal history of malignant neoplasm of breast: Secondary | ICD-10-CM

## 2022-09-13 DIAGNOSIS — Z7952 Long term (current) use of systemic steroids: Secondary | ICD-10-CM

## 2022-09-13 DIAGNOSIS — Z86018 Personal history of other benign neoplasm: Secondary | ICD-10-CM

## 2022-09-13 DIAGNOSIS — I776 Arteritis, unspecified: Secondary | ICD-10-CM | POA: Diagnosis not present

## 2022-09-13 DIAGNOSIS — I350 Nonrheumatic aortic (valve) stenosis: Secondary | ICD-10-CM | POA: Diagnosis present

## 2022-09-13 DIAGNOSIS — Z923 Personal history of irradiation: Secondary | ICD-10-CM

## 2022-09-13 DIAGNOSIS — Z87442 Personal history of urinary calculi: Secondary | ICD-10-CM

## 2022-09-13 DIAGNOSIS — Z89022 Acquired absence of left finger(s): Secondary | ICD-10-CM

## 2022-09-13 DIAGNOSIS — E271 Primary adrenocortical insufficiency: Secondary | ICD-10-CM | POA: Diagnosis present

## 2022-09-13 DIAGNOSIS — Z8619 Personal history of other infectious and parasitic diseases: Secondary | ICD-10-CM

## 2022-09-13 DIAGNOSIS — I7300 Raynaud's syndrome without gangrene: Secondary | ICD-10-CM | POA: Diagnosis present

## 2022-09-13 DIAGNOSIS — Z888 Allergy status to other drugs, medicaments and biological substances status: Secondary | ICD-10-CM

## 2022-09-13 DIAGNOSIS — Z79890 Hormone replacement therapy (postmenopausal): Secondary | ICD-10-CM

## 2022-09-13 DIAGNOSIS — E669 Obesity, unspecified: Secondary | ICD-10-CM | POA: Diagnosis present

## 2022-09-13 DIAGNOSIS — R5600 Simple febrile convulsions: Secondary | ICD-10-CM | POA: Diagnosis not present

## 2022-09-13 DIAGNOSIS — Z9882 Breast implant status: Secondary | ICD-10-CM

## 2022-09-13 DIAGNOSIS — I5189 Other ill-defined heart diseases: Secondary | ICD-10-CM

## 2022-09-13 DIAGNOSIS — I890 Lymphedema, not elsewhere classified: Secondary | ICD-10-CM | POA: Diagnosis present

## 2022-09-13 DIAGNOSIS — R0602 Shortness of breath: Secondary | ICD-10-CM | POA: Diagnosis not present

## 2022-09-13 LAB — COMPREHENSIVE METABOLIC PANEL
ALT: 25 U/L (ref 0–44)
AST: 25 U/L (ref 15–41)
Albumin: 4.3 g/dL (ref 3.5–5.0)
Alkaline Phosphatase: 64 U/L (ref 38–126)
Anion gap: 10 (ref 5–15)
BUN: 26 mg/dL — ABNORMAL HIGH (ref 6–20)
CO2: 25 mmol/L (ref 22–32)
Calcium: 8.9 mg/dL (ref 8.9–10.3)
Chloride: 104 mmol/L (ref 98–111)
Creatinine, Ser: 1.41 mg/dL — ABNORMAL HIGH (ref 0.44–1.00)
GFR, Estimated: 45 mL/min — ABNORMAL LOW (ref >60)
Glucose, Bld: 83 mg/dL (ref 70–99)
Potassium: 3.5 mmol/L (ref 3.5–5.1)
Sodium: 139 mmol/L (ref 135–145)
Total Bilirubin: 0.5 mg/dL (ref 0.3–1.2)
Total Protein: 7.6 g/dL (ref 6.5–8.1)

## 2022-09-13 LAB — RESP PANEL BY RT-PCR (RSV, FLU A&B, COVID)  RVPGX2
Influenza A by PCR: NEGATIVE
Influenza B by PCR: NEGATIVE
Resp Syncytial Virus by PCR: NEGATIVE
SARS Coronavirus 2 by RT PCR: NEGATIVE

## 2022-09-13 LAB — CBC WITH DIFFERENTIAL/PLATELET
Abs Immature Granulocytes: 0.03 10*3/uL (ref 0.00–0.07)
Basophils Absolute: 0.1 10*3/uL (ref 0.0–0.1)
Basophils Relative: 1 %
Eosinophils Absolute: 0.2 10*3/uL (ref 0.0–0.5)
Eosinophils Relative: 2 %
HCT: 33.5 % — ABNORMAL LOW (ref 36.0–46.0)
Hemoglobin: 9.9 g/dL — ABNORMAL LOW (ref 12.0–15.0)
Immature Granulocytes: 0 %
Lymphocytes Relative: 35 %
Lymphs Abs: 3.5 10*3/uL (ref 0.7–4.0)
MCH: 24.4 pg — ABNORMAL LOW (ref 26.0–34.0)
MCHC: 29.6 g/dL — ABNORMAL LOW (ref 30.0–36.0)
MCV: 82.7 fL (ref 80.0–100.0)
Monocytes Absolute: 1.3 10*3/uL — ABNORMAL HIGH (ref 0.1–1.0)
Monocytes Relative: 13 %
Neutro Abs: 4.8 10*3/uL (ref 1.7–7.7)
Neutrophils Relative %: 49 %
Platelets: 387 10*3/uL (ref 150–400)
RBC: 4.05 MIL/uL (ref 3.87–5.11)
RDW: 18.1 % — ABNORMAL HIGH (ref 11.5–15.5)
WBC: 10 10*3/uL (ref 4.0–10.5)
nRBC: 0 % (ref 0.0–0.2)

## 2022-09-13 LAB — URINALYSIS, ROUTINE W REFLEX MICROSCOPIC
Bilirubin Urine: NEGATIVE
Glucose, UA: NEGATIVE mg/dL
Hgb urine dipstick: NEGATIVE
Ketones, ur: NEGATIVE mg/dL
Leukocytes,Ua: NEGATIVE
Nitrite: NEGATIVE
Protein, ur: NEGATIVE mg/dL
Specific Gravity, Urine: 1.021 (ref 1.005–1.030)
pH: 5 (ref 5.0–8.0)

## 2022-09-13 LAB — LACTIC ACID, PLASMA: Lactic Acid, Venous: 1.1 mmol/L (ref 0.5–1.9)

## 2022-09-13 MED ORDER — CLONAZEPAM 0.5 MG PO TABS
0.5000 mg | ORAL_TABLET | Freq: Three times a day (TID) | ORAL | Status: DC | PRN
  Administered 2022-09-13 – 2022-09-14 (×2): 0.5 mg via ORAL
  Filled 2022-09-13 (×2): qty 1

## 2022-09-13 MED ORDER — MENTHOL 3 MG MT LOZG: 1.0000 | LOZENGE | OROMUCOSAL | Status: DC | PRN

## 2022-09-13 MED ORDER — ALBUTEROL SULFATE (2.5 MG/3ML) 0.083% IN NEBU
2.5000 mg | INHALATION_SOLUTION | Freq: Four times a day (QID) | RESPIRATORY_TRACT | Status: DC | PRN
  Administered 2022-09-16: 2.5 mg via RESPIRATORY_TRACT
  Filled 2022-09-13: qty 3

## 2022-09-13 MED ORDER — HYDROCORTISONE 20 MG PO TABS: 20.0000 mg | ORAL_TABLET | Freq: Every day | ORAL | Status: DC

## 2022-09-13 MED ORDER — HYDROCORTISONE 10 MG PO TABS
10.0000 mg | ORAL_TABLET | ORAL | Status: DC
  Filled 2022-09-13: qty 1

## 2022-09-13 MED ORDER — RIMEGEPANT SULFATE 75 MG PO TBDP: 75.0000 mg | ORAL_TABLET | Freq: Every day | ORAL | Status: DC | PRN

## 2022-09-13 MED ORDER — ESCITALOPRAM OXALATE 20 MG PO TABS
20.0000 mg | ORAL_TABLET | Freq: Every evening | ORAL | Status: DC
  Administered 2022-09-13 – 2022-09-16 (×4): 20 mg via ORAL
  Filled 2022-09-13: qty 1
  Filled 2022-09-13: qty 2
  Filled 2022-09-13 (×2): qty 1

## 2022-09-13 MED ORDER — SODIUM CHLORIDE 0.9 % IV SOLN: 2.0000 g | Freq: Once | INTRAVENOUS | Status: DC

## 2022-09-13 MED ORDER — GUAIFENESIN 100 MG/5ML PO LIQD: 15.0000 mL | Freq: Four times a day (QID) | ORAL | Status: DC | PRN

## 2022-09-13 MED ORDER — HYDROMORPHONE HCL 1 MG/ML IJ SOLN: 1.0000 mg | INTRAMUSCULAR | Status: DC | PRN

## 2022-09-13 MED ORDER — LEVOTHYROXINE SODIUM 100 MCG PO TABS
100.0000 ug | ORAL_TABLET | Freq: Every morning | ORAL | Status: DC
  Administered 2022-09-14 – 2022-09-17 (×4): 100 ug via ORAL
  Filled 2022-09-13 (×4): qty 1

## 2022-09-13 MED ORDER — ROSUVASTATIN CALCIUM 10 MG PO TABS
10.0000 mg | ORAL_TABLET | Freq: Every day | ORAL | Status: DC
  Administered 2022-09-14 – 2022-09-17 (×4): 10 mg via ORAL
  Filled 2022-09-13 (×4): qty 1

## 2022-09-13 MED ORDER — PANTOPRAZOLE SODIUM 40 MG PO TBEC
40.0000 mg | DELAYED_RELEASE_TABLET | Freq: Every day | ORAL | Status: DC
  Administered 2022-09-14 – 2022-09-17 (×4): 40 mg via ORAL
  Filled 2022-09-13 (×3): qty 1

## 2022-09-13 MED ORDER — TIOTROPIUM BROMIDE MONOHYDRATE 1.25 MCG/ACT IN AERS: 2.0000 | INHALATION_SPRAY | Freq: Every day | RESPIRATORY_TRACT | Status: DC

## 2022-09-13 MED ORDER — SACUBITRIL-VALSARTAN 24-26 MG PO TABS: 1.0000 | ORAL_TABLET | Freq: Two times a day (BID) | ORAL | Status: DC

## 2022-09-13 MED ORDER — VANCOMYCIN HCL 1750 MG/350ML IV SOLN
1750.0000 mg | Freq: Once | INTRAVENOUS | Status: AC
  Administered 2022-09-13: 1750 mg via INTRAVENOUS
  Filled 2022-09-13: qty 350

## 2022-09-13 MED ORDER — VANCOMYCIN HCL 750 MG/150ML IV SOLN
750.0000 mg | INTRAVENOUS | Status: DC
  Administered 2022-09-14 – 2022-09-15 (×2): 750 mg via INTRAVENOUS
  Filled 2022-09-13 (×3): qty 150

## 2022-09-13 MED ORDER — INFLUENZA VAC SPLIT QUAD 0.5 ML IM SUSY: 0.5000 mL | PREFILLED_SYRINGE | INTRAMUSCULAR | Status: DC

## 2022-09-13 MED ORDER — TOPIRAMATE 25 MG PO TABS
150.0000 mg | ORAL_TABLET | Freq: Every day | ORAL | Status: DC
  Administered 2022-09-13 – 2022-09-16 (×4): 150 mg via ORAL
  Filled 2022-09-13 (×5): qty 2

## 2022-09-13 MED ORDER — MELATONIN 5 MG PO TABS: 5.0000 mg | ORAL_TABLET | Freq: Once | ORAL | Status: DC

## 2022-09-13 MED ORDER — NITROGLYCERIN 0.4 MG SL SUBL: 0.4000 mg | SUBLINGUAL_TABLET | SUBLINGUAL | Status: DC | PRN

## 2022-09-13 MED ORDER — FENTANYL CITRATE PF 50 MCG/ML IJ SOSY
50.0000 ug | PREFILLED_SYRINGE | Freq: Once | INTRAMUSCULAR | Status: AC
  Administered 2022-09-13: 50 ug via INTRAVENOUS
  Filled 2022-09-13: qty 1

## 2022-09-13 MED ORDER — LACTATED RINGERS IV BOLUS
1000.0000 mL | Freq: Once | INTRAVENOUS | Status: AC
  Administered 2022-09-13: 1000 mL via INTRAVENOUS

## 2022-09-13 MED ORDER — HYDROCORTISONE SOD SUC (PF) 100 MG IJ SOLR
100.0000 mg | Freq: Three times a day (TID) | INTRAMUSCULAR | Status: DC
  Administered 2022-09-13 – 2022-09-15 (×7): 100 mg via INTRAVENOUS
  Filled 2022-09-13 (×8): qty 2

## 2022-09-13 MED ORDER — SACCHAROMYCES BOULARDII 250 MG PO CAPS
250.0000 mg | ORAL_CAPSULE | ORAL | Status: DC
  Administered 2022-09-14 – 2022-09-17 (×7): 250 mg via ORAL
  Filled 2022-09-13 (×7): qty 1

## 2022-09-13 MED ORDER — UMECLIDINIUM BROMIDE 62.5 MCG/ACT IN AEPB
1.0000 | INHALATION_SPRAY | Freq: Every day | RESPIRATORY_TRACT | Status: DC
  Administered 2022-09-14 – 2022-09-16 (×4): 1 via RESPIRATORY_TRACT
  Filled 2022-09-13: qty 7

## 2022-09-13 MED ORDER — PIPERACILLIN-TAZOBACTAM 3.375 G IVPB 30 MIN
3.3750 g | Freq: Once | INTRAVENOUS | Status: AC
  Administered 2022-09-13: 3.375 g via INTRAVENOUS
  Filled 2022-09-13 (×2): qty 50

## 2022-09-13 MED ORDER — OXYCODONE HCL 5 MG PO TABS
5.0000 mg | ORAL_TABLET | Freq: Four times a day (QID) | ORAL | Status: DC | PRN
  Administered 2022-09-13: 5 mg via ORAL
  Filled 2022-09-13: qty 1

## 2022-09-13 MED ORDER — LORAZEPAM 2 MG/ML IJ SOLN
1.0000 mg | Freq: Once | INTRAMUSCULAR | Status: AC
  Administered 2022-09-13: 1 mg via INTRAVENOUS
  Filled 2022-09-13: qty 1

## 2022-09-13 MED ORDER — LINAGLIPTIN 5 MG PO TABS
5.0000 mg | ORAL_TABLET | Freq: Every day | ORAL | Status: DC
  Administered 2022-09-13: 5 mg via ORAL
  Filled 2022-09-13: qty 1

## 2022-09-13 MED ORDER — SODIUM CHLORIDE 0.9 % IV SOLN
2.0000 g | Freq: Two times a day (BID) | INTRAVENOUS | Status: DC
  Administered 2022-09-13 – 2022-09-14 (×4): 2 g via INTRAVENOUS
  Filled 2022-09-13 (×4): qty 12.5

## 2022-09-13 MED ORDER — HYDROMORPHONE HCL 1 MG/ML IJ SOLN
0.5000 mg | INTRAMUSCULAR | Status: DC | PRN
  Administered 2022-09-13 – 2022-09-16 (×13): 0.5 mg via INTRAVENOUS
  Filled 2022-09-13: qty 1
  Filled 2022-09-13 (×13): qty 0.5

## 2022-09-13 MED ORDER — BUMETANIDE 1 MG PO TABS: 1.0000 mg | ORAL_TABLET | Freq: Two times a day (BID) | ORAL | Status: DC

## 2022-09-13 MED ORDER — DIPHENHYDRAMINE HCL 50 MG/ML IJ SOLN
25.0000 mg | Freq: Four times a day (QID) | INTRAMUSCULAR | Status: DC | PRN
  Administered 2022-09-13 – 2022-09-16 (×9): 25 mg via INTRAVENOUS
  Filled 2022-09-13 (×9): qty 1

## 2022-09-13 MED ORDER — BENZONATATE 100 MG PO CAPS
200.0000 mg | ORAL_CAPSULE | Freq: Three times a day (TID) | ORAL | Status: DC | PRN
  Administered 2022-09-16 – 2022-09-17 (×4): 200 mg via ORAL
  Filled 2022-09-13 (×4): qty 2

## 2022-09-13 MED ORDER — ACETAMINOPHEN 650 MG RE SUPP: 650.0000 mg | Freq: Four times a day (QID) | RECTAL | Status: DC | PRN

## 2022-09-13 MED ORDER — ACETAMINOPHEN 325 MG PO TABS
650.0000 mg | ORAL_TABLET | Freq: Four times a day (QID) | ORAL | Status: DC | PRN
  Administered 2022-09-14 (×2): 650 mg via ORAL
  Filled 2022-09-13 (×2): qty 2

## 2022-09-13 MED ORDER — BUPROPION HCL ER (XL) 150 MG PO TB24
150.0000 mg | ORAL_TABLET | Freq: Every day | ORAL | Status: DC
  Administered 2022-09-14 – 2022-09-17 (×4): 150 mg via ORAL
  Filled 2022-09-13 (×5): qty 1

## 2022-09-13 MED ORDER — DIPHENHYDRAMINE HCL 25 MG PO CAPS
25.0000 mg | ORAL_CAPSULE | Freq: Once | ORAL | Status: AC
  Administered 2022-09-13: 25 mg via ORAL
  Filled 2022-09-13: qty 1

## 2022-09-13 MED ORDER — BISOPROLOL FUMARATE 5 MG PO TABS
2.5000 mg | ORAL_TABLET | Freq: Every day | ORAL | Status: DC
  Filled 2022-09-13: qty 0.5

## 2022-09-13 MED ORDER — HYDROCORTISONE 10 MG PO TABS: 10.0000 mg | ORAL_TABLET | ORAL | Status: DC

## 2022-09-13 MED ORDER — LAMOTRIGINE 25 MG PO TABS
150.0000 mg | ORAL_TABLET | Freq: Every day | ORAL | Status: DC
  Administered 2022-09-13 – 2022-09-16 (×4): 150 mg via ORAL
  Filled 2022-09-13 (×4): qty 2

## 2022-09-13 MED ORDER — DRONABINOL 2.5 MG PO CAPS
2.5000 mg | ORAL_CAPSULE | Freq: Every day | ORAL | Status: DC
  Administered 2022-09-13 – 2022-09-16 (×4): 2.5 mg via ORAL
  Filled 2022-09-13 (×4): qty 1

## 2022-09-13 MED ORDER — MOMETASONE FURO-FORMOTEROL FUM 200-5 MCG/ACT IN AERO
2.0000 | INHALATION_SPRAY | Freq: Two times a day (BID) | RESPIRATORY_TRACT | Status: DC
  Administered 2022-09-13: 2 via RESPIRATORY_TRACT
  Filled 2022-09-13: qty 8.8

## 2022-09-13 MED ORDER — ASPIRIN 81 MG PO TBEC
81.0000 mg | DELAYED_RELEASE_TABLET | Freq: Every day | ORAL | Status: DC
  Administered 2022-09-13 – 2022-09-16 (×4): 81 mg via ORAL
  Filled 2022-09-13 (×4): qty 1

## 2022-09-13 NOTE — ED Triage Notes (Addendum)
Swelling and sores appearing on left hand today. Veryyyy complex medical history.   Sts she has been having fever of 102 at home. Last took ibuprofen around 1AM. Has became septic numerous times but never finds a source.

## 2022-09-13 NOTE — ED Notes (Signed)
A call back message left for Dr. Olevia Bowens for patient's request for nausea medicine.

## 2022-09-13 NOTE — Progress Notes (Signed)
A consult was received from an ED physician for Vancomycin and Zosyn per pharmacy dosing.  The patient's profile has been reviewed for ht/wt/allergies/indication/available labs.    A one time order has been placed for Vancomycin 1791m IV and Zosyn 3.375gm IV.    Further antibiotics/pharmacy consults should be ordered by admitting physician if indicated.                       Thank you, PEverette Rank PharmD 09/13/2022  5:25 AM

## 2022-09-13 NOTE — ED Notes (Signed)
Charge nurse notified that Dr. Olevia Bowens has not responded to messages sent via call back. Pressure remains 76/56.

## 2022-09-13 NOTE — ED Notes (Signed)
Pt advises being unable to provide urine sample at this time, will monitor.

## 2022-09-13 NOTE — Progress Notes (Deleted)
Cardiology Office Note   Date:  09/13/2022   ID:  [name], DOB 1971-05-11, MRN [id number]  PCP:  Su Grand, MD  Cardiologist:   Mertie Moores, MD   No chief complaint on file.  Problem List:  1. Palpitations/premature ventricular contractions 2.  history of Hodgkin's lymphoma-status post mantle radiation 3. History of breast cancer-status post Reconstruction 4. History of cervical cancer 5. 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 2015 for regrowth of tumor.  6. History of thyroidectomy - hx of thyroid cancer  7. Raynaud's phenomenon 8. Appendectomy 9. History chest pains-normal heart catheterization, normal TEE 10.   history of adrenal insufficiency  11. Hyperlipidemia   Previous notes.[name] is done very well since I last saw her about a year ago. She has had some occasional palpitations that are typically controlled with metoprolol. She's not had any episodes of chest pain. She's been able to stop all of her steroids. Her adrenal glands have gradually improved and her now producing cortisol.  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  She is still working at [employer].   Feb 24, 2013:  She has done well since I last saw her .  She has had her breast implants replaced ( old ones were leaking).  Her costochondritis has resolved.  She has not been lifting.    She has run in 6 5K races this year.  She is playing tennis.    She is still in [place] school.  She is doing her orthopedic rotation.    Nov. 11, 2014:  Her HR has been high.  She has a head ache frequently.  She started Adderall recently.   The adderall has help with her focus but she feels much worse in it.   She has been on the Adderall for 2 months and the tachycardia started about 1 week ago.  She was doing cycle classes 3 times a week.  She is exercising regularly and for  the past week, her HR is extremely fast.   She has gone into menapause and has lots of hot flashes.   Feb. 23, 2015:  She has been having some recent episodes of tachycardia. We increased her metoprolol at the last visit. We performed an echocardiogram which revealed   Left ventricle: The cavity size was normal. Systolic function was normal. The estimated ejection fraction was in the range of 55% to 60%. Wall motion was normal; there were no regional wall motion abnormalities. Left ventricular diastolic function parameters were normal. - Aortic valve: Trivial regurgitation. - Mitral valve: Trivial regurgitation  She tried to stop her Adderall ( did not do well with that).   She decreased her dose slightly and she is not having much tachycardia. She has been   She has been found to have some osteoperosis and osteopenia.    March 09, 2014:  [name] is doing ok.. No recent cardiac problems.   She has been very active and is feeling great.  Playing tennis on a regular basis.     Feb. 17, 2016:   EIZA CANNIFF is a 51 y.o. female who presents for for evaluation of palpitations , hypotension. Echo yesterday  Left ventricle: The cavity size was normal. Systolic function was   normal. The estimated ejection fraction was in the range of 55%   to 60%. Wall motion was normal;  there were no regional wall   motion abnormalities. - Aortic valve: There was trivial regurgitation. - Mitral valve: Calcified annulus.  Labs were ordered yesterday but she was not able to get them. Continues to have palpitations today She is off her Estrogen at this point.  She is back on the Toprol - skipped 2 days.  Has been taking the PRN propranolol  Not feeling well and has not been eating or drinking well. We given 1 dose of lasix   December 15, 2014:  She has continued to have palpitations  - last 20-30 seconds .  HR has remained fairly high. Has been working out regularly .  These are going ok.    Feb 03, 2015: [name] continues to have palpitations . She wore a monitor but actually only wore is for about 1/2 of the time ( she was at [employer]) We have discussed implantable loop.  I have some concerns about doing any procedures on her .  She has only been taking the toprol 50 mg a day instead of 75.   May 17, 2015:  [name] is seen today for follow up. She has a history of hyperlipidemia and we sent her to lipid clinic. She is tolerating the Crestor very well.   Still having palpitations but learning how to manage .  Oct. 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 Has been seen at [facility] and is now at [facility]  She has sent scans to [facility]. Is on steroids to shrink tumor, has gained lots of weight.  Is hoping to be able to get a bx so that she have this tumor treated.   She is now on home O2.  O2 sats dropped to 78 while sleeping on room air.    O2 sats on 2.5 liters are good. She uses 4 lpm at night .   Is having more palpitations. Would like a refill for propranolol'   April 18, 2017:  [name] is seen today  Is still on home O2.,  Has chronic respiratory failure ( unclear etiology at this point )   Has RV failure.   Had a right heart cath.  PA pressures were only minimally elevated.  Had significant leg swelling   Has been on steroids.  Has developed pre-diabetes  Nov. 15,, 2018  In Sept 25,she woke up from a nap Ad tingling and numbness in her legs , had no strength in her legs . 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   2nd MRI of back showed a lesion between T10 -T 11.   Nothing that truly showed compression.  Was transferred to rehab unit in [place].    Has regained some motor ability I her lower abd.   Ruptured her left breast implant using her arms in  rehab  Scheduled to have her implants removed later this month .   Still on home O2,  Palpitations have been well controlled.   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 ( Bronch in Dec. 2018)    Has pulmonary HTN.  Lasix is no working for her at this point  - takes Lasix 20 mg a day   Is using a walker  now - was in a wheelchair last time  Had temporary paralysis of her legs.  Improving .  Regaining her strength.    Jan. 27, 2020:  [name] is seen for follow up  Has had pressure ulders in her hands  Is on chronic steroids for her adrenal insuff.  Has had an echocardiogram at [facility] in December.  She has normal left ventricular systolic function with an ejection fraction of 55%.  She has trivial mitral regurgitation and trivial tricuspid regurgitation as well as trivial aortic insufficiency. Was no comment on pulmonary pressures.  Sept. 23, 2022:   [name] is seen  Covid infection 11 days ago ,  hreceived Manufacturing engineer. 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 16, 2023 [name] is seen for follow up of her palpitations, adrenal insufficiency Chest pain   Recently in the hospital  Hospitalized 4 times since June.  Has had URIs   She was in the ICU during this last hospitalization  Fevers / chills  Was found to have pneumonia , Temp of 102   Has diffuse whole body anasarca , pitting in her hands   Taking nebs   Coughs when she lies back  Will repeat her echo  [facility]  Start Entresto 24-26 BID ( has documented diastolic CHF)    Nov. 14, 2023  No show, patient cancelled   Aug 20, 2022 [name] is seen for follow up of her severe dyspnea, wheezing, diffuse wheezing , productive cough She was started on Entresto   Echo from Oct. 30, 3267 Normal LV systolic function,  grade I DD RV function and pressure  was normal   Has been having some CP , chest pressure  Radiates to intrascapular   Had RCA coronary calcification   We discussed her angina  HR is too fast for Cor CTA   Discussed cath  Will discuss with Dr. Haroldine Laws  Start bisoprolol 2.5 QD  She has been a DNR in the past due to her gradually declining condition.   I have suggested that she rescind her DNR during the cath    Dec. 15, 2023  [name] is seen for follow up of her chest pain  Cath showed no significant CAD Normal PA pressures    Past Medical History:  Diagnosis Date   Addison's disease ([place])    Adrenal insufficiency ([place])    Anemia    Anxiety    Aortic stenosis    Aortic stenosis    Appendicitis 12/19/09   Appendicitis    Breast cancer (HCC)    STATUS POST BILATERAL MASTECTOMY. STATUS POST RECONSTRUCTION. SHE HAD SILICONE BREAST IMPLANTS AND THE LEFT IMPLANT IS LEAKING SLIGHTLY   Cellulitis of right middle finger 11/07/2018   Cervical cancer ([place]) 12/23/2018   Chest pain    CHF with right heart failure ([place]) 04/17/2017   Chronic respiratory failure with hypoxia (HCC) 12/23/2018   Cough variant asthma 04/13/2019   Depression    GERD (gastroesophageal reflux disease)    takes Dexilant and carafate and gi coctail    Headache    migraines on a daily and monthly regimen    Heart murmur    History of kidney stones    Hodgkin lymphoma ([place])    STATUS POST MANTLE RADIATION   Hodgkin's lymphoma ([place])    1987   Hypertension  Hypoxia    Necrotizing fasciitis ([place]) 12/23/2018   Non-ischemic cardiomyopathy ([place])    Osteoporosis    Palpitations    Pituitary adenoma ([place]) 12/23/2018   Pneumonia    PONV (postoperative nausea and vomiting)    Pre-diabetes    per pt; no meds   Pulmonary hypertension ([place]) 12/23/2018   Raynaud phenomenon    Right heart failure ([place]) 04/17/2017   Seizures ([place])    last febrile seizure was approx 3 weeks ago per report on 12/01/2020   Sleep apnea    upcoming sleep study per pt     Supplemental oxygen dependent    3 liters   SVT (supraventricular tachycardia)    Tachycardia    Thyroid cancer ([place])    STATUS POST SURGICAL REMOVAL-CURRENT ON THYROID REPLACEMENT    Past Surgical History:  Procedure Laterality Date   ABDOMINAL HYSTERECTOMY     AMPUTATION Left 01/30/2019   Procedure: Left Index finger amputation with flap reconstruction and repair reconstruction;  Surgeon: Roseanne Kaufman, MD;  Location: [facility];  Service: Orthopedics;  Laterality: Left;   APPENDECTOMY     breast implants and removal      breast implants but leaking      CARDIAC CATHETERIZATION  05/18/09   NORMAL CATH   COLONOSCOPY     hx of chemotherapy      hx of radiation therapy      I & D EXTREMITY Left 12/23/2018   Procedure: IRRIGATION AND DEBRIDEMENT HAND / INDEX FINGER;  Surgeon: Roseanne Kaufman, MD;  Location: [facility];  Service: Orthopedics;  Laterality: Left;   IR CV LINE INJECTION  03/22/2022   IR IMAGING GUIDED PORT INSERTION  05/06/2020   IR IMAGING GUIDED PORT INSERTION  12/04/2021   IR REMOVAL TUN ACCESS W/ PORT W/O FL MOD SED  04/27/2020   IR REMOVAL TUN ACCESS W/ PORT W/O FL MOD SED  12/04/2021   IR REMOVAL TUN ACCESS W/ PORT W/O FL MOD SED  03/30/2022   KIDNEY STONE SURGERY     LUMBAR PUNCTURE W/ INTRATHECAL CHEMOTHERAPY     MASTECTOMY     PITUITARY SURGERY     RIGHT/LEFT HEART CATH AND CORONARY ANGIOGRAPHY N/A 04/02/2018   Procedure: RIGHT/LEFT HEART CATH AND CORONARY ANGIOGRAPHY;  Surgeon: Burnell Blanks, MD;  Location: [facility];  Service: Cardiovascular;  Laterality: N/A;   RIGHT/LEFT HEART CATH AND CORONARY ANGIOGRAPHY N/A 08/31/2022   Procedure: RIGHT/LEFT HEART CATH AND CORONARY ANGIOGRAPHY;  Surgeon: Jolaine Artist, MD;  Location: [facility] [place] [facility];  Service: Cardiovascular;  Laterality: N/A;   TOOTH EXTRACTION N/A 12/05/2020   Procedure: DENTAL RESTORATION/EXTRACTIONS;  Surgeon: Ronal Fear, MD;  Location: [facility];  Service: Oral Surgery;  Laterality:  N/A;   TOTAL THYROIDECTOMY     VIDEO BRONCHOSCOPY Bilateral 11/14/2018   Procedure: VIDEO BRONCHOSCOPY WITHOUT FLUORO;  Surgeon: Margaretha Seeds, MD;  Location: [facility];  Service: Cardiopulmonary;  Laterality: Bilateral;   VIDEO BRONCHOSCOPY WITH ENDOBRONCHIAL ULTRASOUND N/A 11/19/2018   Procedure: VIDEO BRONCHOSCOPY WITH ENDOBRONCHIAL ULTRASOUND;  Surgeon: Margaretha Seeds, MD;  Location: [place] [facility];  Service: Thoracic;  Laterality: N/A;     Current Facility-Administered Medications  Medication Dose Route Frequency Provider Last Rate Last Admin   0.9 %  sodium chloride infusion   Intravenous PRN Margaretha Seeds, MD       Mepolizumab SOLR 100 mg  100 mg Subcutaneous Q28 days Margaretha Seeds, MD   100 mg at 08/31/19 1017  Current Outpatient Medications  Medication Sig Dispense Refill   albuterol (PROVENTIL) (2.5 MG/3ML) 0.083% nebulizer solution Take 3 mLs (2.5 mg total) by nebulization every 6 (six) hours as needed for wheezing or shortness of breath. 360 mL 2   aspirin EC 81 MG tablet Take 1 tablet (81 mg total) by mouth daily. Swallow whole. (Patient taking differently: Take 81 mg by mouth at bedtime. Chewable) 90 tablet 3   benzonatate (TESSALON) 200 MG capsule Take 200 mg by mouth 3 (three) times daily as needed for cough.     bisoprolol (ZEBETA) 5 MG tablet Take 0.5 tablet by mouth daily (Patient taking differently: Take 2.5 mg by mouth daily. Take 0.5 tablet by mouth daily) 45 tablet 3   bumetanide (BUMEX) 1 MG tablet TAKE 1 TABLET BY MOUTH TWICE A DAY 180 tablet 0   buPROPion (WELLBUTRIN XL) 150 MG 24 hr tablet TAKE 1 TABLET BY MOUTH EVERY DAY 90 tablet 0   clonazePAM (KLONOPIN) 0.5 MG tablet Take 1 tablet (0.5 mg total) by mouth 3 (three) times daily as needed for anxiety. 90 tablet 2   dexlansoprazole (DEXILANT) 60 MG capsule Take 60 mg by mouth daily.     Diclofenac Potassium,Migraine, (CAMBIA) 50 MG PACK Take 50 mg by mouth daily as needed for migraine.     DIPHENHYDRAMINE  HCL IJ Inject 0.5 mLs into the muscle daily.     dronabinol (MARINOL) 2.5 MG capsule TAKE 1 CAPSULE BY MOUTH TWICE A DAY AS NEEDED NAUSEA (Patient taking differently: Take 2.5 mg by mouth at bedtime.) 60 capsule 0   EMGALITY 120 MG/ML SOSY Inject 120 mg into the skin every 28 (twenty-eight) days.     EPINEPHrine 0.3 mg/0.3 mL IJ SOAJ injection Inject 0.3 mg into the muscle as needed for anaphylaxis. 2 each 2   escitalopram (LEXAPRO) 20 MG tablet Take 1 tablet (20 mg total) by mouth every evening. 90 tablet 3   FLORASTOR 250 MG capsule Take 250 mg by mouth See admin instructions. Take 250 mg by mouth mid-morning and mid-afternoon (1600)     fluticasone-salmeterol (ADVAIR HFA) 230-21 MCG/ACT inhaler INHALE 2 PUFFS INTO THE LUNGS TWICE A DAY 12 each 5   guaiFENesin (ROBITUSSIN) 100 MG/5ML liquid Take 15 mLs by mouth every 6 (six) hours as needed for cough or to loosen phlegm. 120 mL 0   hydrocortisone (CORTEF) 10 MG tablet Take 1-2 tablets (10-20 mg total) by mouth See admin instructions. Take 20 mg in the am and 71m in the evening. Take after completing prednisone (Patient taking differently: Take 10-20 mg by mouth See admin instructions. Take 20 mg in the am and 166min the evening. (1400-1600)Take after completing prednisone)     lamoTRIgine (LAMICTAL) 150 MG tablet Take one tablet at bedtime. (Patient taking differently: Take 150 mg by mouth at bedtime. Take one tablet at bedtime.) 90 tablet 1   Lancets (ONETOUCH DELICA PLUS LAIYMEBR83EMISC 3 (three) times daily. for testing     nitroGLYCERIN (NITROSTAT) 0.4 MG SL tablet Dissolve 1 tablet under the tongue every 5 minutes as needed for chest pain. Max of 3 doses, then 911. 25 tablet 6   ONETOUCH VERIO test strip 3 (three) times daily. for testing     OXYGEN Inhale 4-5 L/min into the lungs continuous.     PROAIR HFA 108 (90 Base) MCG/ACT inhaler Inhale 2 puffs into the lungs every 4 (four) hours as needed for wheezing or shortness of breath.      Rimegepant Sulfate (  NURTEC) 75 MG TBDP Take 75 mg by mouth daily as needed (Migraine).      rosuvastatin (CRESTOR) 10 MG tablet Take 10 mg by mouth daily. Mid morning     sacubitril-valsartan (ENTRESTO) 24-26 MG Take 1 tablet by mouth 2 (two) times daily. 60 tablet 11   sitaGLIPtin (JANUVIA) 100 MG tablet Take 1 tablet (100 mg total) by mouth daily.     sucralfate (CARAFATE) 1 GM/10ML suspension Take 1 g by mouth daily as needed (as directed for ulcers).     SYNTHROID 100 MCG tablet Take 1 tablet (100 mcg total) by mouth every morning. (Patient taking differently: Take 100 mcg by mouth every morning. * Must be name brand*)     Tiotropium Bromide Monohydrate (SPIRIVA RESPIMAT) 1.25 MCG/ACT AERS Inhale 2 puffs into the lungs daily. 4 g 5   topiramate (TOPAMAX) 50 MG tablet Take 150 mg by mouth at bedtime.      umeclidinium bromide (INCRUSE ELLIPTA) 62.5 MCG/ACT AEPB Inhale 1 puff into the lungs daily. (Patient not taking: Reported on 08/29/2022) 30 each 2   zolpidem (AMBIEN CR) 12.5 MG CR tablet Take 12.5 mg by mouth at bedtime.     Facility-Administered Medications Ordered in Other Visits  Medication Dose Route Frequency Provider Last Rate Last Admin   acetaminophen (TYLENOL) tablet 650 mg  650 mg Oral Q6H PRN Reubin Milan, MD       Or   acetaminophen (TYLENOL) suppository 650 mg  650 mg Rectal Q6H PRN Reubin Milan, MD       albuterol (PROVENTIL) (2.5 MG/3ML) 0.083% nebulizer solution 2.5 mg  2.5 mg Nebulization Q6H PRN Reubin Milan, MD       aspirin EC tablet 81 mg  81 mg Oral QHS Reubin Milan, MD       benzonatate (TESSALON) capsule 200 mg  200 mg Oral TID PRN Reubin Milan, MD       bisoprolol (ZEBETA) tablet 2.5 mg  2.5 mg Oral Daily Reubin Milan, MD       buPROPion (WELLBUTRIN XL) 24 hr tablet 150 mg  150 mg Oral Daily Reubin Milan, MD       ceFEPIme (MAXIPIME) 2 g in sodium chloride 0.9 % 100 mL IVPB  2 g Intravenous Q12H Lynelle Doctor, RPH 200  mL/hr at 09/13/22 1137 2 g at 09/13/22 1137   clonazePAM (KLONOPIN) tablet 0.5 mg  0.5 mg Oral TID PRN Reubin Milan, MD       diphenhydrAMINE (BENADRYL) injection 25 mg  25 mg Intravenous Q6H PRN Reubin Milan, MD   25 mg at 09/13/22 1130   dronabinol (MARINOL) capsule 2.5 mg  2.5 mg Oral QHS Reubin Milan, MD       escitalopram (LEXAPRO) tablet 20 mg  20 mg Oral QPM Reubin Milan, MD   20 mg at 09/13/22 1816   guaiFENesin (ROBITUSSIN) 100 MG/5ML liquid 15 mL  15 mL Oral Q6H PRN Reubin Milan, MD       hydrocortisone sodium succinate (SOLU-CORTEF) 100 MG injection 100 mg  100 mg Intravenous Q8H Reubin Milan, MD   100 mg at 09/13/22 1651   HYDROmorphone (DILAUDID) injection 1 mg  1 mg Intravenous Q4H PRN Reubin Milan, MD       [START ON 09/14/2022] influenza vac split quadrivalent PF (FLUARIX) injection 0.5 mL  0.5 mL Intramuscular Tomorrow-1000 Reubin Milan, MD       lamoTRIgine (LAMICTAL) tablet  150 mg  150 mg Oral QHS Reubin Milan, MD       [START ON 09/14/2022] levothyroxine (SYNTHROID) tablet 100 mcg  100 mcg Oral q morning Reubin Milan, MD       linagliptin [facility]) tablet 5 mg  5 mg Oral Daily Reubin Milan, MD   5 mg at 09/13/22 1817   menthol-cetylpyridinium (CEPACOL) lozenge 3 mg  1 lozenge Oral PRN Reubin Milan, MD       mometasone-formoterol [facility]) 200-5 MCG/ACT inhaler 2 puff  2 puff Inhalation BID Reubin Milan, MD   2 puff at 09/13/22 1659   nitroGLYCERIN (NITROSTAT) SL tablet 0.4 mg  0.4 mg Sublingual Q5 min PRN Reubin Milan, MD       pantoprazole (PROTONIX) EC tablet 40 mg  40 mg Oral Daily Reubin Milan, MD       Rimegepant Sulfate TBDP 75 mg  75 mg Oral Daily PRN Reubin Milan, MD       rosuvastatin (CRESTOR) tablet 10 mg  10 mg Oral Daily Reubin Milan, MD       [START ON 09/14/2022] saccharomyces boulardii (FLORASTOR) capsule 250 mg  250 mg Oral 2 times per day  Reubin Milan, MD       topiramate (TOPAMAX) tablet 150 mg  150 mg Oral QHS Reubin Milan, MD       umeclidinium bromide (INCRUSE ELLIPTA) 62.5 MCG/ACT 1 puff  1 puff Inhalation Daily Reubin Milan, MD       [START ON 09/14/2022] vancomycin (VANCOREADY) IVPB 750 mg/150 mL  750 mg Intravenous Q24H Pham, Anh P, RPH        Allergies:   Ferrous bisglycinate chelate [iron], Mushroom extract complex, Na ferric gluc cplx in sucrose, Cymbalta [duloxetine hcl], Hydromorphone, Ondansetron hcl, Promethazine, Succinylcholine, Buprenorphine hcl, Compazine, Duloxetine, Metoclopramide, Morphine and related, Ondansetron, Promethazine hcl, and Tegaderm ag mesh [silver]    Social History:  The patient  reports that she has never smoked. She has never used smokeless tobacco. She reports that she does not currently use alcohol. She reports that she does not use drugs.   Family History:  The patient's Family history is unknown by patient.    ROS:  Please see the history of present illness.   Physical Exam: There were no vitals taken for this visit.     GEN:  Well nourished, well developed in no acute distress HEENT: Normal NECK: No JVD; No carotid bruits LYMPHATICS: No lymphadenopathy CARDIAC: RRR ***, no murmurs, rubs, gallops RESPIRATORY:  Clear to auscultation without rales, wheezing or rhonchi  ABDOMEN: Soft, non-tender, non-distended MUSCULOSKELETAL:  No edema; No deformity  SKIN: Warm and dry NEUROLOGIC:  Alert and oriented x 3   EKG:    Recent Labs: 02/09/2022: TSH 0.370 07/01/2022: B Natriuretic Peptide 63.5 07/08/2022: Magnesium 2.3 09/13/2022: ALT 25; BUN 26; Creatinine, Ser 1.41; Hemoglobin 9.9; Platelets 387; Potassium 3.5; Sodium 139    Lipid Panel    Component Value Date/Time   CHOL 178 09/22/2020 0935   CHOL 204 (H) 01/23/2018 1225   CHOL 207 (H) 12/08/2013 0823   TRIG 253 (H) 09/22/2020 0935   TRIG 96 12/08/2013 0823   HDL 44 09/22/2020 0935   HDL 60  01/23/2018 1225   HDL 57 12/08/2013 0823   CHOLHDL 4.0 09/22/2020 0935   VLDL 51 (H) 09/22/2020 0935   LDLCALC 83 09/22/2020 0935   LDLCALC 107 (H) 01/23/2018 1225   LDLCALC 131 (H) 12/08/2013  4142      Wt Readings from Last 3 Encounters:  09/13/22 180 lb (81.6 kg)  08/31/22 183 lb (83 kg)  08/20/22 181 lb (82.1 kg)      Other studies Reviewed: Additional studies/ records that were reviewed today include: . Review of the above records demonstrates:    ASSESSMENT AND PLAN:  -  chest pain / tightness :   Cath showed normal RV pressures No significant CAD   -  Right heart failure:              - Palpitations/premature ventricular contractions -      -  history of Hodgkin's lymphoma-status post mantle radiation   - History of breast cancer-s/p reconstruction    4. History of cervical cancer  5. History of pituitary tumor-  6. History of thyroidectomy -       7. Raynaud's phenomenon  8. Appendectomy  9. History chest pains- . 10.   history of adrenal insufficiency  -   continue Cortef.   Current medicines are reviewed at length with the patient today.  The patient does not have concerns regarding medicines.  The following changes have been made:  no change  Disposition:    will follow up in 2 months    Signed, Mertie Moores, MD  09/13/2022 7:21 PM    [address], [place], [place]  [address] Phone: [phone]; Fax: [phone]

## 2022-09-13 NOTE — ED Notes (Signed)
A page placed to Dr. Olevia Bowens regarding patient's low BP- 81/53 and 75/46. Awaiting return call.

## 2022-09-13 NOTE — Progress Notes (Signed)
Pharmacy Antibiotic Note  [name] is a 51 y.o. female who presented to the ED  on 09/13/2022 with c/o fever, left hand redness, swelling and sores.  Pharmacy has been consulted to dose vancomycin and cefepime for hand cellulitis.   Today, 09/13/2022: - day #1 abx - scr 1.41 (crcl~50) - LA 1.1 - vancomycin 1750 mg IV x1 given in the ED on 12/14 at 6a and zosyn 3.375 gm IV x1 given at 5:37a  Plan: - vancomycin 750 mg IV q24h (for est AUC 461) starting on 12/15 - cefepime 2gm IV q12h - monitor renal function closely ____________________________________  Height: _0  (165.1 cm) Weight: 81.6 kg (180 lb) IBW/kg (Calculated) : 57  Temp (24hrs), Avg:98.3 F (36.8 C), Min:98.3 F (36.8 C), Max:98.3 F (36.8 C)  Recent Labs  Lab 09/13/22 0427 09/13/22 0428  WBC 10.0  --   CREATININE 1.41*  --   LATICACIDVEN  --  1.1    Estimated Creatinine Clearance: 49.8 mL/min (A) (by C-G formula based on SCr of 1.41 mg/dL (H)).    Allergies  Allergen Reactions   Ferrous Bisglycinate Chelate [Iron] Anaphylaxis and Other (See Comments)    Only IV - only FERRLICET   Mushroom Extract Complex Anaphylaxis   Na Ferric Gluc Cplx In Sucrose Anaphylaxis   Cymbalta [Duloxetine Hcl] Swelling and Anxiety   Hydromorphone Other (See Comments)    BP drop and heart rate drops 5.1.20 PT REPORTS THAT SHE TAKES DILAUDID AT HOME   Ondansetron Hcl Other (See Comments)   Promethazine Other (See Comments)    Other reaction(s): Unknown   Succinylcholine Other (See Comments)    Lock Jaw   Buprenorphine Hcl Hives   Compazine Other (See Comments)    Altered mental status Aggression   Duloxetine     Other reaction(s): Not available   Metoclopramide Other (See Comments)    Dystonia   Morphine And Related Hives   Ondansetron Hives and Rash    Other reaction(s): Unknown Other reaction(s): Unknown   Promethazine Hcl Hives   Tegaderm Ag Mesh [Silver] Rash    Old formulation only, is able to tolerate  new formulation     Thank you for allowing pharmacy to be a part of this patient's care.  Lynelle Doctor 09/13/2022 7:49 AM

## 2022-09-13 NOTE — H&P (Signed)
History and Physical    Patient: [name] FHL:[id number] DOB: Jun 28, 1971 DOA: 09/13/2022 DOS: the patient was seen and examined on 09/13/2022 PCP: Su Grand, MD  Patient coming from: Home  Chief Complaint:  Chief Complaint  Patient presents with   Hand Swelling   HPI: [name] is a 51 y.o. female with medical history significant of Addison's disease, anemia, aortic stenosis, anxiety, depression, appendicitis, breast cancer, cellulitis of right middle finger, cervical cancer, grade 1 diastolic dysfunction with right heart failure, chronic respiratory failure with hypoxia on home oxygen at 5 LPM, GERD, migraine headaches, nephrolithiasis, Hodgkin's lymphoma, hypertension, hypoxia, necrotizing fasciitis, osteoporosis, pituitary adenoma, history of pneumonia, prediabetes, Raynaud phenomenon, seizures, sleep apnea, SVT, thyroid cancer, history of multiple hospitalizations most recently 2 months ago due to sepsis secondary to pneumonia and asthma exacerbation who is coming to the emergency department with complaints of 2 days of fever, progressively worse right hand erythema, edema and tenderness.  She has also had sore throat.  No chills or night sweats. No rhinorrhea, worsening chronic dyspnea, recent wheezing or hemoptysis.  No chest pain, palpitations, diaphoresis, PND or orthopnea.  No appetite changes,  melena or hematochezia.  No flank pain, dysuria, frequency or hematuria.  No polyuria, polydipsia, polyphagia or blurred vision.  ED course: Initial vital signs were temperature 98.3 F, pulse 89, respiration 18, BP 114/77 mmHg and O2 sat 100% on nasal cannula oxygen.  The patient received 500 mL of normal saline bolus, diphenhydramine 25 mg p.o., fentanyl 50 mcg IVP x 1, lorazepam 1 mg IVP x 1, vancomycin and Zosyn.  Lab work: Her urinalysis was normal.  Coronavirus and influenza PCR was negative.  Lactic acid was unremarkable.  Her CBC is her white count of 10.0, hemoglobin 9.9  g/dL platelets 387.  CMP with a BUN of 26 and creatinine 1.41 mg/dL, which is slightly higher than the baseline.  The patient recently was started on Entresto 24/26 mg.  The rest of the CMP values are within normal range.  Imaging: Left hand x-ray with soft tissue swelling without gas, opaque foreign body or osseous abnormality.  Portable 1 view chest radiograph with low volume chest without acute or focal finding.   Review of Systems: As mentioned in the history of present illness. All other systems reviewed and are negative.  Past Medical History:  Diagnosis Date   Addison's disease ([place])    Adrenal insufficiency ([place])    Anemia    Anxiety    Aortic stenosis    Aortic stenosis    Appendicitis 12/19/09   Appendicitis    Breast cancer (HCC)    STATUS POST BILATERAL MASTECTOMY. STATUS POST RECONSTRUCTION. SHE HAD SILICONE BREAST IMPLANTS AND THE LEFT IMPLANT IS LEAKING SLIGHTLY   Cellulitis of right middle finger 11/07/2018   Cervical cancer ([place]) 12/23/2018   Chest pain    CHF with right heart failure ([place]) 04/17/2017   Chronic respiratory failure with hypoxia (HCC) 12/23/2018   Cough variant asthma 04/13/2019   Depression    GERD (gastroesophageal reflux disease)    takes Dexilant and carafate and gi coctail    Headache    migraines on a daily and monthly regimen    Heart murmur    History of kidney stones    Hodgkin lymphoma ([place])    STATUS POST MANTLE RADIATION   Hodgkin's lymphoma ([place])    1987   Hypertension    Hypoxia    Necrotizing fasciitis ([place]) 12/23/2018   Non-ischemic cardiomyopathy (  [place])    Osteoporosis    Palpitations    Pituitary adenoma ([place]) 12/23/2018   Pneumonia    PONV (postoperative nausea and vomiting)    Pre-diabetes    per pt; no meds   Pulmonary hypertension ([place]) 12/23/2018   Raynaud phenomenon    Right heart failure ([place]) 04/17/2017   Seizures ([place])    last febrile seizure was approx 3 weeks ago per report on 12/01/2020   Sleep apnea    upcoming sleep  study per pt    Supplemental oxygen dependent    3 liters   SVT (supraventricular tachycardia)    Tachycardia    Thyroid cancer ([place])    STATUS POST SURGICAL REMOVAL-CURRENT ON THYROID REPLACEMENT   Past Surgical History:  Procedure Laterality Date   ABDOMINAL HYSTERECTOMY     AMPUTATION Left 01/30/2019   Procedure: Left Index finger amputation with flap reconstruction and repair reconstruction;  Surgeon: Roseanne Kaufman, MD;  Location: [facility];  Service: Orthopedics;  Laterality: Left;   APPENDECTOMY     breast implants and removal      breast implants but leaking      CARDIAC CATHETERIZATION  05/18/09   NORMAL CATH   COLONOSCOPY     hx of chemotherapy      hx of radiation therapy      I & D EXTREMITY Left 12/23/2018   Procedure: IRRIGATION AND DEBRIDEMENT HAND / INDEX FINGER;  Surgeon: Roseanne Kaufman, MD;  Location: [facility];  Service: Orthopedics;  Laterality: Left;   IR CV LINE INJECTION  03/22/2022   IR IMAGING GUIDED PORT INSERTION  05/06/2020   IR IMAGING GUIDED PORT INSERTION  12/04/2021   IR REMOVAL TUN ACCESS W/ PORT W/O FL MOD SED  04/27/2020   IR REMOVAL TUN ACCESS W/ PORT W/O FL MOD SED  12/04/2021   IR REMOVAL TUN ACCESS W/ PORT W/O FL MOD SED  03/30/2022   KIDNEY STONE SURGERY     LUMBAR PUNCTURE W/ INTRATHECAL CHEMOTHERAPY     MASTECTOMY     PITUITARY SURGERY     RIGHT/LEFT HEART CATH AND CORONARY ANGIOGRAPHY N/A 04/02/2018   Procedure: RIGHT/LEFT HEART CATH AND CORONARY ANGIOGRAPHY;  Surgeon: Burnell Blanks, MD;  Location: [facility];  Service: Cardiovascular;  Laterality: N/A;   RIGHT/LEFT HEART CATH AND CORONARY ANGIOGRAPHY N/A 08/31/2022   Procedure: RIGHT/LEFT HEART CATH AND CORONARY ANGIOGRAPHY;  Surgeon: Jolaine Artist, MD;  Location: [facility];  Service: Cardiovascular;  Laterality: N/A;   TOOTH EXTRACTION N/A 12/05/2020   Procedure: DENTAL RESTORATION/EXTRACTIONS;  Surgeon: Ronal Fear, MD;  Location: [facility];  Service: Oral Surgery;   Laterality: N/A;   TOTAL THYROIDECTOMY     VIDEO BRONCHOSCOPY Bilateral 11/14/2018   Procedure: VIDEO BRONCHOSCOPY WITHOUT FLUORO;  Surgeon: Margaretha Seeds, MD;  Location: [facility];  Service: Cardiopulmonary;  Laterality: Bilateral;   VIDEO BRONCHOSCOPY WITH ENDOBRONCHIAL ULTRASOUND N/A 11/19/2018   Procedure: VIDEO BRONCHOSCOPY WITH ENDOBRONCHIAL ULTRASOUND;  Surgeon: Margaretha Seeds, MD;  Location: [facility];  Service: Thoracic;  Laterality: N/A;   Social History:  reports that she has never smoked. She has never used smokeless tobacco. She reports that she does not currently use alcohol. She reports that she does not use drugs.  Allergies  Allergen Reactions   Ferrous Bisglycinate Chelate [Iron] Anaphylaxis and Other (See Comments)    Only IV - only FERRLICET   Mushroom Extract Complex Anaphylaxis   Na Ferric Gluc Cplx In Sucrose Anaphylaxis  Cymbalta [Duloxetine Hcl] Swelling and Anxiety   Hydromorphone Other (See Comments)    BP drop and heart rate drops 5.1.20 PT REPORTS THAT SHE TAKES DILAUDID AT HOME   Ondansetron Hcl Other (See Comments)   Promethazine Other (See Comments)    Other reaction(s): Unknown   Succinylcholine Other (See Comments)    Lock Jaw   Buprenorphine Hcl Hives   Compazine Other (See Comments)    Altered mental status Aggression   Duloxetine     Other reaction(s): Not available   Metoclopramide Other (See Comments)    Dystonia   Morphine And Related Hives   Ondansetron Hives and Rash    Other reaction(s): Unknown Other reaction(s): Unknown   Promethazine Hcl Hives   Tegaderm Ag Mesh [Silver] Rash    Old formulation only, is able to tolerate new formulation    Family History  Family history unknown: Yes    Prior to Admission medications   Medication Sig Start Date End Date Taking? Authorizing Provider  albuterol (PROVENTIL) (2.5 MG/3ML) 0.083% nebulizer solution Take 3 mLs (2.5 mg total) by nebulization every 6 (six) hours as needed for  wheezing or shortness of breath. 07/09/22 10/07/22  Pokhrel, Corrie Mckusick, MD  aspirin EC 81 MG tablet Take 1 tablet (81 mg total) by mouth daily. Swallow whole. Patient taking differently: Take 81 mg by mouth at bedtime. Chewable 07/16/22   Nahser, Wonda Cheng, MD  benzonatate (TESSALON) 200 MG capsule Take 200 mg by mouth 3 (three) times daily as needed for cough.    [provider]  bisoprolol (ZEBETA) 5 MG tablet Take 0.5 tablet by mouth daily 08/20/22   Nahser, Wonda Cheng, MD  bumetanide (BUMEX) 1 MG tablet TAKE 1 TABLET BY MOUTH TWICE A DAY 09/04/22   Nahser, Wonda Cheng, MD  buPROPion (WELLBUTRIN XL) 150 MG 24 hr tablet TAKE 1 TABLET BY MOUTH EVERY DAY 05/30/22   Mozingo, Berdie Ogren, NP  clonazePAM (KLONOPIN) 0.5 MG tablet Take 1 tablet (0.5 mg total) by mouth 3 (three) times daily as needed for anxiety. 08/13/22   Mozingo, Berdie Ogren, NP  dexlansoprazole (DEXILANT) 60 MG capsule Take 60 mg by mouth daily.    [provider]  Diclofenac Potassium,Migraine, (CAMBIA) 50 MG PACK Take 50 mg by mouth daily as needed for migraine. 05/16/21   [provider]  DIPHENHYDRAMINE HCL IJ Inject 0.5 mLs into the muscle daily as needed (for nausea).    [provider]  dronabinol (MARINOL) 2.5 MG capsule TAKE 1 CAPSULE BY MOUTH TWICE A DAY AS NEEDED NAUSEA 09/10/22   Nicholas Lose, MD  EMGALITY 120 MG/ML SOSY Inject 120 mg into the skin every 28 (twenty-eight) days.    [provider]  EPINEPHrine 0.3 mg/0.3 mL IJ SOAJ injection Inject 0.3 mg into the muscle as needed for anaphylaxis. 06/13/21   Margaretha Seeds, MD  escitalopram (LEXAPRO) 20 MG tablet Take 1 tablet (20 mg total) by mouth every evening. 11/08/21   Mozingo, Berdie Ogren, NP  FLORASTOR 250 MG capsule Take 250 mg by mouth See admin instructions. Take 250 mg by mouth mid-morning and mid-afternoon (1600)    [provider]  fluticasone-salmeterol (ADVAIR HFA) 230-21 MCG/ACT inhaler INHALE 2 PUFFS INTO  THE LUNGS TWICE A DAY Patient not taking: Reported on 08/29/2022 08/15/22   Margaretha Seeds, MD  guaiFENesin (ROBITUSSIN) 100 MG/5ML liquid Take 15 mLs by mouth every 6 (six) hours as needed for cough or to loosen phlegm. 07/09/22   Pokhrel, Corrie Mckusick, MD  hydrocortisone (CORTEF) 10 MG tablet Take 1-2 tablets (10-20 mg total) by mouth See admin instructions. Take 20 mg in the am and 29m in the evening. Take after completing prednisone Patient taking differently: Take 10-20 mg by mouth See admin instructions. Take 20 mg in the am and 160min the evening. (1400-1600)Take after completing prednisone 07/09/22   Pokhrel, LaCorrie MckusickMD  lamoTRIgine (LAMICTAL) 150 MG tablet Take one tablet at bedtime. 06/19/22   Mozingo, ReBerdie OgrenNP  Lancets (OCole[facility] Cataract And Eye Laser Surgery Center IncELICA PLUS [id number] 3 (three) times daily. for testing 06/12/21   [provider]  montelukast (SINGULAIR) 10 MG tablet Take 1 tablet (10 mg total) by mouth at bedtime. 07/09/22   Pokhrel, LaCorrie MckusickMD  nitroGLYCERIN (NITROSTAT) 0.4 MG SL tablet Dissolve 1 tablet under the tongue every 5 minutes as needed for chest pain. Max of 3 doses, then 911. 08/20/22   Nahser, PhWonda ChengMD  ONColumb[facility] GastroenterologyERIO test strip 3 (three) times daily. for testing 06/12/21   [provider]  OXYGEN Inhale 4-5 L/min into the lungs continuous.    [provider]  PROAIR HFA 108 ([phone]) MCG/ACT inhaler Inhale 2 puffs into the lungs every 4 (four) hours as needed for wheezing or shortness of breath.    [provider]  Rimegepant Sulfate (NURTEC) 75 MG TBDP Take 75 mg by mouth daily as needed (Migraine).     [provider]  rosuvastatin (CRESTOR) 10 MG tablet Take 10 mg by mouth daily. Mid morning 02/28/18   [provider]  sacubitril-valsartan (ENTRESTO) 24-26 MG Take 1 tablet by mouth 2 (two) times daily. 07/16/22   Nahser, PhWonda ChengMD  sitaGLIPtin (JANUVIA) 100 MG tablet Take 1 tablet (100 mg total) by mouth daily. 10/19/21    GuNicholas LoseMD  sucralfate (CARAFATE) 1 GM/10ML suspension Take 1 g by mouth daily as needed (as directed for ulcers).    [provider]  SYNTHROID 100 MCG tablet Take 1 tablet (100 mcg total) by mouth every morning. Patient taking differently: Take 100 mcg by mouth every morning. * Must be name brand* 10/19/21   GuNicholas LoseMD  Tiotropium Bromide Monohydrate (SPIRIVA RESPIMAT) 1.25 MCG/ACT AERS Inhale 2 puffs into the lungs daily. 08/15/22   ElMargaretha SeedsMD  topiramate (TOPAMAX) 50 MG tablet Take 150 mg by mouth at bedtime.  12/25/19   [provider]  umeclidinium bromide (INCRUSE ELLIPTA) 62.5 MCG/ACT AEPB Inhale 1 puff into the lungs daily. Patient not taking: Reported on 08/29/2022 07/09/22 10/07/22  Pokhrel, LaCorrie MckusickMD  zolpidem (AMBIEN CR) 12.5 MG CR tablet Take 12.5 mg by mouth at bedtime. 03/05/22   [provider]    Physical Exam: Vitals:   09/13/22 0429 09/13/22 0530 09/13/22 0545 09/13/22 0600  BP:    96/67  Pulse:  77 79 77  Resp:  _0 Temp:      TempSrc:      SpO2: 100% 100% 100% 100%  Weight:      Height:       Physical Exam Vitals and nursing note reviewed.  Constitutional:      Appearance: She is ill-appearing.  HENT:     Head: Normocephalic.     Nose: No rhinorrhea.     Mouth/Throat:     Mouth: Mucous membranes are dry.  Eyes:     General: No scleral icterus.    Pupils: Pupils are equal, round, and reactive to light.  Cardiovascular:     Rate and  Rhythm: Normal rate and regular rhythm.     Heart sounds: S1 normal and S2 normal. Murmur heard.     Decrescendo systolic murmur is present with a grade of 1/6.     Comments: Bilateral hands pitting edema.  Stage I lymphedema with with no pitting component. Pulmonary:     Effort: Pulmonary effort is normal.     Breath sounds: No wheezing, rhonchi or rales.  Abdominal:     General: Bowel sounds are normal. There is no distension.     Palpations: Abdomen is soft.      Tenderness: There is no abdominal tenderness.  Musculoskeletal:     Right hand: Swelling present.     Left hand: Swelling and tenderness present. Decreased range of motion.     Cervical back: Neck supple.     Right lower leg: 1+ Edema present.     Left lower leg: 1+ Edema present.     Comments: Left middle index partial amputation.  Skin:    Comments: Left hand erythema, edema, calor and TTP.  Neurological:     General: No focal deficit present.     Mental Status: She is alert and oriented to person, place, and time.  Psychiatric:        Mood and Affect: Mood normal.     Data Reviewed:  Results are pending, will review when available.  07/30/2022 echocardiogram   1. Left ventricular ejection fraction, by estimation, is 60 to 65%. The  left ventricle has normal function. The left ventricle has no regional  wall motion abnormalities. Left ventricular diastolic parameters are  consistent with Grade I diastolic  dysfunction (impaired relaxation).   2. Right ventricular systolic function is normal. The right ventricular  size is normal. There is normal pulmonary artery systolic pressure. The  estimated right ventricular systolic pressure is 41.6 mmHg.   3. The mitral valve is normal in structure. No evidence of mitral valve  regurgitation. No evidence of mitral stenosis.   4. The aortic valve is calcified. There is mild calcification of the  aortic valve. There is mild thickening of the aortic valve. Aortic valve  regurgitation is mild. Mild aortic valve stenosis. Aortic valve area, by  VTI measures 1.49 cm. Aortic valve  mean gradient measures 13.5 mmHg. Aortic valve Vmax measures 2.49 m/s.   5. The inferior vena cava is normal in size with greater than 50%  respiratory variability, suggesting right atrial pressure of 3 mmHg.   08/31/2022 RIGHT/LEFT HEART CATH AND CORONARY ANGIOGRAPHY   Conclusion    The left ventricular ejection fraction is 55-65% by visual estimate.    Findings:   Ao = 90/54 (71) LV = 103/13 RA =  4 RV =  26/5 PA =  23/9 (17) PCW = 10 Fick cardiac output/index = 6.3/3.3 PVR = 1.2 WU FA sat = 99% PA sat = 71%, 71% AoV with mid stenosis: Mean gradient 76mHG AVA 1.99cm2   Assessment: 1. EF 55-65% 2. Normal coronaries 3. Normal filling pressures with no evidence of PAH 4. Very mild aortic stenosis   EKG: Vent. rate 79 BPM PR interval 160 ms QRS duration 88 ms QT/QTcB 427/490 ms P-R-T axes 61 -23 59 Sinus rhythm Borderline left axis deviation Abnormal R-wave progression, early transition  Plan/Discussion:   Continue medical therapy.    DGlori Bickers MD  9:20 AM  Assessment and Plan: Principal Problem:   Cellulitis Admit to PCU/inpatient. Continue IV fluids. Continue cefepime 2 g every 12 hours.  Continue vancomycin per pharmacy. Follow-up blood culture and sensitivity Follow CBC and CMP in a.m. Analgesics as needed.  Active Problems:   Hypertension Continue bisoprolol 2.5 mg p.o. bedtime. Hold Entresto and bumetanide for now. Resume once blood pressure numbers are higher.    Grade I diastolic dysfunction  History of pulmonary hypertension Holding Entresto and diuretic for now.    GERD (gastroesophageal reflux disease) Continue Dexilant or formulary equivalent.    Hyperlipidemia Continue rosuvastatin 10 mg p.o. daily.    Chronic respiratory failure with hypoxia (HCC)   Moderate persistent asthma without complication Continue supplemental oxygen. Continue Advair or formulary equivalent twice daily. Albuterol nebs every 6 hours as needed.    Addison's disease ([place]) Hydrocortisone 100 mg IVP every 8 hours during acute stress. Switch to oral dose once clinically indicated.    Seizure-like activity (HCC) Continue Topamax 150 mg p.o. bedtime. Continue Lamictal 150 mg p.o. bedtime. Follow-up with neurology as an outpatient.    Normocytic anemia At baseline. Monitor hematocrit  hemoglobin.    Anxiety and depression Continue bupropion XL 150 mg p.o. daily Continue escitalopram 20 mg p.o. every evening Continue clonazepam as needed.    Hypothyroidism Continue Synthroid 100 mcg p.o. daily.       Advance Care Planning:   Code Status: Full Code   Consults:   Family Communication:   Severity of Illness: The appropriate patient status for this patient is INPATIENT. Inpatient status is judged to be reasonable and necessary in order to provide the required intensity of service to ensure the patient's safety. The patient's presenting symptoms, physical exam findings, and initial radiographic and laboratory data in the context of their chronic comorbidities is felt to place them at high risk for further clinical deterioration. Furthermore, it is not anticipated that the patient will be medically stable for discharge from the hospital within 2 midnights of admission.   * I certify that at the point of admission it is my clinical judgment that the patient will require inpatient hospital care spanning beyond 2 midnights from the point of admission due to high intensity of service, high risk for further deterioration and high frequency of surveillance required.*  Author: Reubin Milan, MD 09/13/2022 7:30 AM  For on call review [email or web address].   This document was prepared using Dragon voice recognition software and may contain some unintended transcription errors.

## 2022-09-13 NOTE — ED Notes (Signed)
A second call with a return call numerical message left.

## 2022-09-13 NOTE — ED Provider Notes (Signed)
[facility] DEPT Provider Note   CSN: [id number] Arrival date & time: 09/13/22  0304     History  Chief Complaint  Patient presents with   Hand Swelling    [name] is a 51 y.o. female.  The history is provided by the patient and medical records.  Rash  51 y.o. F with complex medical hx of addisons disease, chronic respiratory failure on 5L, GERD, HTN, HLP, raynaud's, hx of lymphoma, cervical cancer, thyroid cancer, pituitary tumor, presenting to the ED with redness/swelling and sores to left hand.  States she has been having intermittent fevers for the past few days up to 102F.  Now has redness/sores present to left hand.  Does have known lymphedema from prior radiation but reports swelling is worse than baseline.  She reports increased pain to left hand.  Hx of recurrent sepsis multiple times in the past, frail immune system at baseline.  Not currently receiving any active cancer treatment.  History of similar skin issues in the past.  Saw dermatology at one point who felt like she had behcets but leery about this diagnosis.  She has had prior partial amputation of left index finger in the past with Dr. Amedeo Plenty.  Home Medications Prior to Admission medications   Medication Sig Start Date End Date Taking? Authorizing Provider  albuterol (PROVENTIL) (2.5 MG/3ML) 0.083% nebulizer solution Take 3 mLs (2.5 mg total) by nebulization every 6 (six) hours as needed for wheezing or shortness of breath. 07/09/22 10/07/22  Pokhrel, Corrie Mckusick, MD  aspirin EC 81 MG tablet Take 1 tablet (81 mg total) by mouth daily. Swallow whole. Patient taking differently: Take 81 mg by mouth at bedtime. Chewable 07/16/22   Nahser, Wonda Cheng, MD  benzonatate (TESSALON) 200 MG capsule Take 200 mg by mouth 3 (three) times daily as needed for cough.    [provider]  bisoprolol (ZEBETA) 5 MG tablet Take 0.5 tablet by mouth daily 08/20/22   Nahser, Wonda Cheng, MD  bumetanide  (BUMEX) 1 MG tablet TAKE 1 TABLET BY MOUTH TWICE A DAY 09/04/22   Nahser, Wonda Cheng, MD  buPROPion (WELLBUTRIN XL) 150 MG 24 hr tablet TAKE 1 TABLET BY MOUTH EVERY DAY 05/30/22   Mozingo, Berdie Ogren, NP  clonazePAM (KLONOPIN) 0.5 MG tablet Take 1 tablet (0.5 mg total) by mouth 3 (three) times daily as needed for anxiety. 08/13/22   Mozingo, Berdie Ogren, NP  dexlansoprazole (DEXILANT) 60 MG capsule Take 60 mg by mouth daily.    [provider]  Diclofenac Potassium,Migraine, (CAMBIA) 50 MG PACK Take 50 mg by mouth daily as needed for migraine. 05/16/21   [provider]  DIPHENHYDRAMINE HCL IJ Inject 0.5 mLs into the muscle daily as needed (for nausea).    [provider]  dronabinol (MARINOL) 2.5 MG capsule TAKE 1 CAPSULE BY MOUTH TWICE A DAY AS NEEDED NAUSEA 09/10/22   Nicholas Lose, MD  EMGALITY 120 MG/ML SOSY Inject 120 mg into the skin every 28 (twenty-eight) days.    [provider]  EPINEPHrine 0.3 mg/0.3 mL IJ SOAJ injection Inject 0.3 mg into the muscle as needed for anaphylaxis. 06/13/21   Margaretha Seeds, MD  escitalopram (LEXAPRO) 20 MG tablet Take 1 tablet (20 mg total) by mouth every evening. 11/08/21   Mozingo, Berdie Ogren, NP  FLORASTOR 250 MG capsule Take 250 mg by mouth See admin instructions. Take 250 mg by mouth mid-morning and mid-afternoon (1600)    [provider]  fluticasone-salmeterol (  ADVAIR HFA) 619-50 MCG/ACT inhaler INHALE 2 PUFFS INTO THE LUNGS TWICE A DAY Patient not taking: Reported on 08/29/2022 08/15/22   Margaretha Seeds, MD  guaiFENesin (ROBITUSSIN) 100 MG/5ML liquid Take 15 mLs by mouth every 6 (six) hours as needed for cough or to loosen phlegm. 07/09/22   Pokhrel, Corrie Mckusick, MD  hydrocortisone (CORTEF) 10 MG tablet Take 1-2 tablets (10-20 mg total) by mouth See admin instructions. Take 20 mg in the am and 83m in the evening. Take after completing prednisone Patient taking differently: Take 10-20 mg by mouth See  admin instructions. Take 20 mg in the am and 138min the evening. (1400-1600)Take after completing prednisone 07/09/22   Pokhrel, LaCorrie MckusickMD  lamoTRIgine (LAMICTAL) 150 MG tablet Take one tablet at bedtime. 06/19/22   Mozingo, ReBerdie OgrenNP  Lancets (OWar[facility] HospitalELICA PLUS LADTOIZT24PMISC 3 (three) times daily. for testing 06/12/21   [provider]  montelukast (SINGULAIR) 10 MG tablet Take 1 tablet (10 mg total) by mouth at bedtime. 07/09/22   Pokhrel, LaCorrie MckusickMD  nitroGLYCERIN (NITROSTAT) 0.4 MG SL tablet Dissolve 1 tablet under the tongue every 5 minutes as needed for chest pain. Max of 3 doses, then 911. 08/20/22   Nahser, PhWonda ChengMD  ONKidspeace National Centers Of New EnglandERIO test strip 3 (three) times daily. for testing 06/12/21   [provider]  OXYGEN Inhale 4-5 L/min into the lungs continuous.    [provider]  PROAIR HFA 108 ([phone] [id number]) MCG/ACT inhaler Inhale 2 puffs into the lungs every 4 (four) hours as needed for wheezing or shortness of breath.    [provider]  Rimegepant Sulfate (NURTEC) 75 MG TBDP Take 75 mg by mouth daily as needed (Migraine).     [provider]  rosuvastatin (CRESTOR) 10 MG tablet Take 10 mg by mouth daily. Mid morning 02/28/18   [provider]  sacubitril-valsartan (ENTRESTO) 24-26 MG Take 1 tablet by mouth 2 (two) times daily. 07/16/22   Nahser, PhWonda ChengMD  sitaGLIPtin (JANUVIA) 100 MG tablet Take 1 tablet (100 mg total) by mouth daily. 10/19/21   GuNicholas LoseMD  sucralfate (CARAFATE) 1 GM/10ML suspension Take 1 g by mouth daily as needed (as directed for ulcers).    [provider]  SYNTHROID 100 MCG tablet Take 1 tablet (100 mcg total) by mouth every morning. Patient taking differently: Take 100 mcg by mouth every morning. * Must be name brand* 10/19/21   GuNicholas LoseMD  Tiotropium Bromide Monohydrate (SPIRIVA RESPIMAT) 1.25 MCG/ACT AERS Inhale 2 puffs into the lungs daily. 08/15/22   ElMargaretha SeedsMD   topiramate (TOPAMAX) 50 MG tablet Take 150 mg by mouth at bedtime.  12/25/19   [provider]  umeclidinium bromide (INCRUSE ELLIPTA) 62.5 MCG/ACT AEPB Inhale 1 puff into the lungs daily. Patient not taking: Reported on 08/29/2022 07/09/22 10/07/22  Pokhrel, LaCorrie MckusickMD  zolpidem (AMBIEN CR) 12.5 MG CR tablet Take 12.5 mg by mouth at bedtime. 03/05/22   [provider]      Allergies    Ferrous bisglycinate chelate [iron], Mushroom extract complex, Na ferric gluc cplx in sucrose, Cymbalta [duloxetine hcl], Hydromorphone, Ondansetron hcl, Promethazine, Succinylcholine, Buprenorphine hcl, Compazine, Duloxetine, Metoclopramide, Morphine and related, Ondansetron, Promethazine hcl, and Tegaderm ag mesh [silver]    Review of Systems   Review of Systems  Skin:  Positive for rash.    Physical Exam Updated Vital Signs BP 96/67   Pulse 77   Temp 98.3 F (36.8 C) (Oral)  Resp 15   Ht _0  (1.651 m)   Wt 81.6 kg   LMP  (LMP Unknown)   SpO2 100%   BMI 29.95 kg/m   Physical Exam Vitals and nursing note reviewed.  Constitutional:      Appearance: She is well-developed.  HENT:     Head: Normocephalic and atraumatic.  Eyes:     Conjunctiva/sclera: Conjunctivae normal.     Pupils: Pupils are equal, round, and reactive to light.  Cardiovascular:     Rate and Rhythm: Normal rate and regular rhythm.     Heart sounds: Normal heart sounds.  Pulmonary:     Effort: Pulmonary effort is normal.     Breath sounds: Normal breath sounds.  Abdominal:     General: Bowel sounds are normal.     Palpations: Abdomen is soft.  Musculoskeletal:        General: Normal range of motion.     Cervical back: Normal range of motion.     Comments: Left dorsal hand with scattered sores that appear scabbed over, there is no active drainage or bleeding, there is diffuse erythema along dorsal hand with noted swelling, there is tenderness and warmth to touch, no tissue crepitus, no streaking into the  fingertips or past the wrist, radial pulse intact Partial amputation of left index finger  Skin:    General: Skin is warm and dry.  Neurological:     Mental Status: She is alert and oriented to person, place, and time.      ED Results / Procedures / Treatments   Labs (all labs ordered are listed, but only abnormal results are displayed) Labs Reviewed  CBC WITH DIFFERENTIAL/PLATELET - Abnormal; Notable for the following components:      Result Value   Hemoglobin 9.9 (*)    HCT 33.5 (*)    MCH 24.4 (*)    MCHC 29.6 (*)    RDW 18.1 (*)    Monocytes Absolute 1.3 (*)    All other components within normal limits  COMPREHENSIVE METABOLIC PANEL - Abnormal; Notable for the following components:   BUN 26 (*)    Creatinine, Ser 1.41 (*)    GFR, Estimated 45 (*)    All other components within normal limits  RESP PANEL BY RT-PCR (RSV, FLU A&B, COVID)  RVPGX2  CULTURE, BLOOD (ROUTINE X 2)  CULTURE, BLOOD (ROUTINE X 2)  URINE CULTURE  LACTIC ACID, PLASMA  URINALYSIS, ROUTINE W REFLEX MICROSCOPIC    EKG EKG Interpretation  Date/Time:  Thursday September 13 2022 04:24:05 EST Ventricular Rate:  79 PR Interval:  160 QRS Duration: 88 QT Interval:  427 QTC Calculation: 490 R Axis:   -23 Text Interpretation: Sinus rhythm Borderline left axis deviation Abnormal R-wave progression, early transition Confirmed by Randal Buba, April ([id number]) on 09/13/2022 4:31:16 AM  Radiology DG Chest 1 View  Result Date: 09/13/2022 CLINICAL DATA:  Hand redness and swelling EXAM: CHEST  1 VIEW COMPARISON:  07/17/2022 FINDINGS: Low volume chest with interstitial crowding. Normal heart size. No effusion or pneumothorax. Sequela bilateral breast/axilla surgery. IMPRESSION: Low volume chest without acute or focal finding. Electronically Signed   By: Jorje Guild M.D.   On: 09/13/2022 05:00   DG Hand Complete Left  Result Date: 09/13/2022 CLINICAL DATA:  Redness and swelling.  Blisters and pain. EXAM: LEFT  HAND - COMPLETE 3+ VIEW COMPARISON:  None Available. FINDINGS: Proximal interphalangeal index finger amputation. No fracture, erosion, or aggressive bone lesion. Soft tissue swelling especially at the dorsal  hand and wrist. No opaque foreign body. IMPRESSION: Soft tissue swelling without gas, opaque foreign body, or osseous abnormality. Electronically Signed   By: Jorje Guild M.D.   On: 09/13/2022 04:55    Procedures Procedures    CRITICAL CARE Performed by: Larene Pickett   Total critical care time: 35 minutes  Critical care time was exclusive of separately billable procedures and treating other patients.  Critical care was necessary to treat or prevent imminent or life-threatening deterioration.  Critical care was time spent personally by me on the following activities: development of treatment plan with patient and/or surrogate as well as nursing, discussions with consultants, evaluation of patient's response to treatment, examination of patient, obtaining history from patient or surrogate, ordering and performing treatments and interventions, ordering and review of laboratory studies, ordering and review of radiographic studies, pulse oximetry and re-evaluation of patient's condition.   Medications Ordered in ED Medications  vancomycin (VANCOREADY) IVPB 1750 mg/350 mL (1,750 mg Intravenous New Bag/Given 09/13/22 0617)  fentaNYL (SUBLIMAZE) injection 50 mcg (50 mcg Intravenous Given 09/13/22 0418)  diphenhydrAMINE (BENADRYL) capsule 25 mg (25 mg Oral Given 09/13/22 0448)  piperacillin-tazobactam (ZOSYN) IVPB 3.375 g (0 g Intravenous Stopped 09/13/22 0617)  LORazepam (ATIVAN) injection 1 mg (1 mg Intravenous Given 09/13/22 0538)    ED Course/ Medical Decision Making/ A&P                           Medical Decision Making Amount and/or Complexity of Data Reviewed Labs: ordered. Radiology: ordered and independent interpretation performed. ECG/medicine tests: ordered and  independent interpretation performed.  Risk Prescription drug management. Decision regarding hospitalization.   51 year old female with complex medical history including Addison's disease, multiple cancer syndromes, presenting to the ED with wounds to left dorsal hand.  States this has been developing over the past 24 hours.  Now hand is erythematous and swollen.  She reports intermittent fevers up to 102F at home.  She unfortunately has had sepsis and bacteremia multiple times in the past.  She is afebrile and nontoxic in appearance here.  She does appear to have developing cellulitis to left dorsal hand.  There are a few wounds but these appear scabbed without any active drainage or bleeding.  There is no tissue crepitus.  Her hand is neurovascularly intact.  She is already had partial amputation of left index finger.  Will obtain labs, lactate, cultures.  Will obtain screening x-rays of left hand and chest given she is chronically oxygen dependent.  Labs as above--normal lactate, normal white blood cell count.  Hand films without any findings of gas or bony erosion.  Chest x-ray is clear.  No electrolyte derangement.  Given patient's complex history and tenuous immune system, feel she would be best served admitted for observation with IV antibiotics.  Blood cultures are pending.  Discussed with Dr. Nevada Crane-- will admit for ongoing care.  Final Clinical Impression(s) / ED Diagnoses Final diagnoses:  Cellulitis of left upper extremity    Rx / DC Orders ED Discharge Orders     None         Larene Pickett, PA-C 09/13/22 [place], April, MD 09/13/22 [id number]

## 2022-09-13 NOTE — ED Notes (Signed)
UTO second set of cultures, pt has poor vascular access.

## 2022-09-14 ENCOUNTER — Ambulatory Visit: Payer: BC Managed Care – PPO | Admitting: Cardiovascular Disease

## 2022-09-14 DIAGNOSIS — L03114 Cellulitis of left upper limb: Secondary | ICD-10-CM | POA: Diagnosis not present

## 2022-09-14 LAB — URINE CULTURE: Culture: NO GROWTH

## 2022-09-14 MED ORDER — CLONAZEPAM 0.5 MG PO TABS: 0.5000 mg | ORAL_TABLET | Freq: Two times a day (BID) | ORAL | Status: DC

## 2022-09-14 MED ORDER — CLONAZEPAM 0.5 MG PO TABS
0.5000 mg | ORAL_TABLET | Freq: Two times a day (BID) | ORAL | Status: DC
  Administered 2022-09-14 – 2022-09-15 (×2): 0.5 mg via ORAL
  Filled 2022-09-14 (×2): qty 1

## 2022-09-14 MED ORDER — LORAZEPAM 2 MG/ML IJ SOLN
0.5000 mg | INTRAMUSCULAR | Status: DC | PRN
  Administered 2022-09-14 – 2022-09-17 (×8): 0.5 mg via INTRAVENOUS
  Filled 2022-09-14 (×9): qty 1

## 2022-09-14 NOTE — Progress Notes (Signed)
[facility] Progress Note Patient: [name] VHQ:[id number] DOB: July 14, 1971 DOA: 09/13/2022  DOS: the patient was seen and examined on 09/14/2022  Brief hospital course: PMH of breast cancer, cervical cancer, Hodgkin's lymphoma, pituitary adenoma SP resection currently on chronic steroids, chronic respiratory failure secondary to pulmonary hypertension and right heart failure on 5 LPM, migraine, anxiety, depression, chronic pain, Raynaud's phenomenon, thyroid cancer, OSA. Present to the hospital with complaints of swelling of the left hand with fever and chills.  Found to have cellulitis.  Responding well to antibiotics. Assessment and Plan: Severe sepsis due to cellulitis. Met SIRS criteria with hypotension, tachypnea. Lactic acid normal. Treated with IV fluids. Currently on IV vancomycin and ceftriaxone. Responding very well to the antibiotic. Blood cultures so far negative.  If remain negative tomorrow on 12/16.  Will transition to oral Omnicef Doxy combination for a total 7-day treatment course. Continue IV fluids for now.  Ultrasound was physiology appears to have resolved.  Adrenal crisis. Currently on Solu-Cortef 100 mg 3 times daily. Monitor.  Intractable nausea. Patient reports that she has intractable nausea and unable to tolerate anything p.o. The time of my evaluation she did not have any nausea. She is allergic to multiple medications and pretty much all antiemetics. Currently on IV Benadryl. Recommend to transition to clonazepam for refractory nausea to scheduled basis and monitor response.  History of tachycardia. Hypertension. Chronic right sided Heart failure/diastolic heart failure. Follows up with cardiology outpatient. Recent right heart cath showed no evidence of PAH and normal filling pressures. On Entresto outpatient.  For now on hold due to hypotension. Normal coronaries.  Mild aortic stenosis.  Chronic respiratory failure. Currently stable on 4  LPM.  Monitor.  HLD. Continue statin.  GERD. Continue home regimen.  History of seizures. On Topamax, Lamictal. Continue.  Normocytic anemia. H&H stable. Monitor.  Anxiety and depression. Patient is on Wellbutrin, Lexapro and clonazepam.  Plans upon currently switch to scheduled from as needed.  Hypothyroidism. Continue Synthroid.  Poor IV access. Patient has an IV access in her right arm currently. Patient has history of vasculitis and difficult stick. Had multiple ports in the past with frequent infections. Okay to insert a PICC line if need for IV access. Will hold off on frequent blood draws.  Obesity Body mass index is 30.17 kg/m.  Placing the pt at higher risk of poor outcomes.   Subjective: Reports severe pain although improving in the right upper extremity.  Reports intractable nausea.  No fever no chills.  No vomiting seen so far.  Physical Exam: General: in mild distress;  Cardiovascular: S1 and S2 Present, aortic systolic  Murmur Respiratory: normal respiratory effort, Bilateral Air entry present, no Crackles, no wheezes Abdomen: Bowel Sound present, Non tender  Extremities: Generalized edema of bilateral lower and upper extremities which appears to be chronic. Improving redness of the left lower extremity Neurology: alert and oriented to time, place, and person    Data Reviewed: I have Reviewed nursing notes, Vitals, and Lab results. Since last encounter, pertinent lab results CBC and BMP   . I have ordered test including CBC and BMP  .   Disposition: Status is: Inpatient Remains inpatient appropriate because: Will continue antibiotics given her history.  SCDs Start: 09/13/22 4132   Family Communication: Husband at bedside Level of care: Progressive continue progressive care due to hypotension. Vitals:   09/14/22 1149 09/14/22 1205 09/14/22 1303 09/14/22 1821  BP: (!) 100/58 (!) 99/59 124/73   Pulse: 86 97 98  Resp:  18 16   Temp: 98.2 F  (36.8 C)  98.2 F (36.8 C)   TempSrc: Oral  Oral   SpO2:  99% 100% 100%  Weight:      Height:         Author: Berle Mull, MD 09/14/2022 7:12 PM  Please look on [email or web address] to find out who is on call.

## 2022-09-14 NOTE — TOC Initial Note (Addendum)
Transition of Care [facility]) - Initial/Assessment Note    Patient Details  Name: [name] MRN: [id number] Date of Birth: October 17, 1970  Transition [facility]) CM/SW Contact:    Dessa Phi, RN Phone Number: 09/14/2022, 12:18 PM  Clinical Narrative:spoke to spouse Delfino Lovett Has home 02-[employer],& [employer];they have already started process for ordering life vest. Not active [place] care otpt palliative care.                 Expected Discharge Plan: Home/Self Care Barriers to Discharge: Continued Medical Work up   Patient Goals and CMS Choice Patient states their goals for this hospitalization and ongoing recovery are::  (Home) CMS Medicare.gov Compare Post Acute Care list provided to:: Patient Represenative (must comment) (Richard(spouse)) Choice offered to / list presented to : Spouse  Expected Discharge Plan and Services Expected Discharge Plan: Home/Self Care   Discharge Planning Services: CM Consult Post Acute Care Choice: NA Living arrangements for the past 2 months: Single Family Home                                      Prior Living Arrangements/Services Living arrangements for the past 2 months: Single Family Home Lives with:: Spouse   Do you feel safe going back to the place where you live?: Yes          Current home services: DME ([employer]-home 02,travel tank.)    Activities of Daily Living Home Assistive Devices/Equipment: Oxygen, Nebulizer ADL Screening (condition at time of admission) Patient's cognitive ability adequate to safely complete daily activities?: Yes Is the patient deaf or have difficulty hearing?: No Does the patient have difficulty seeing, even when wearing glasses/contacts?: No Does the patient have difficulty concentrating, remembering, or making decisions?: No Patient able to express need for assistance with ADLs?: Yes Does the patient have difficulty dressing or bathing?: No Independently performs ADLs?: Yes  (appropriate for developmental age) Does the patient have difficulty walking or climbing stairs?: Yes Weakness of Legs: None Weakness of Arms/Hands: None  Permission Sought/Granted Permission sought to share information with : Case Manager Permission granted to share information with : Yes, Verbal Permission Granted  Share Information with NAME:  (Case manager)           Emotional Assessment              Admission diagnosis:  Cellulitis [L03.90] Cellulitis of left upper extremity [L03.114] Patient Active Problem List   Diagnosis Date Noted   Cellulitis 09/13/2022   Grade I diastolic dysfunction 98/33/8250   Excessive daytime sleepiness 08/01/2022   Community acquired pneumonia    Severe persistent asthma with acute exacerbation    Anxiety    Anxiety and depression 04/06/2022   GERD without esophagitis 04/06/2022   Sepsis ([place]) 04/06/2022   PICC (peripherally inserted central catheter) in place    Bacteremia due to Enterobacter species 03/29/2022   Normocytic anemia 03/29/2022   Major depressive disorder, recurrent episode, moderate (HCC)    Generalized anxiety disorder    Seizure-like activity ([place]) 12/08/2020   High anion gap metabolic acidosis 53/97/6734   Hypokalemia 12/08/2020   AKI (acute kidney injury) ([place]) 04/27/2020   Vasculopathy 04/27/2020   Bacteremia 04/19/2020   Addison's disease ([place])    Nausea 04/01/2020   Generalized weakness 02/10/2020   Headache 02/10/2020   Subcutaneous nodule 02/01/2020   Port-A-Cath in place 09/09/2019   Moderate persistent asthma without complication  08/09/2019   Fever 07/21/2019   Superficial thrombophlebitis 07/16/2019   Injury of left index finger 12/24/2018   History of pituitary adenoma 12/23/2018   History of cervical cancer 12/23/2018   Chronic respiratory failure with hypoxia (HCC) 12/23/2018   SOB (shortness of breath)    Nephrolithiasis 11/07/2018   Oxygen dependent 11/07/2018   Migraine 11/07/2018   PVC's  (premature ventricular contractions) 10/27/2018   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   Right heart failure ([place]) 04/17/2017   Hyperlipidemia 03/09/2014   Hx of Hodgkins lymphoma    History of ductal carcinoma in situ (DCIS) of breast    History of thyroid cancer    Adrenal insufficiency ([place])    Hypertension    Raynaud phenomenon    GERD (gastroesophageal reflux disease)    PCP:  Su Grand, MD Pharmacy:   [id number] [place] [id number]: [phone]: [phone]    Social Determinants of Health (SDOH) Interventions Housing Interventions: Intervention Not Indicated  Readmission Risk Interventions    07/02/2022    1:15 PM 07/02/2022   11:32 AM 04/27/2020    1:19 PM  Readmission Risk Prevention Plan  Transportation Screening Complete Complete Complete  PCP or Specialist Appt within 5-7 Days Complete Complete   PCP or Specialist Appt within 3-5 Days   Complete  Home Care Screening Complete Complete   Medication Review (RN CM) Complete Complete   HRI or [place]  Complete  Social Work Consult for [place]/Counseling   Complete  Palliative Care Screening   Not Applicable  Medication Review (Press photographer   Complete

## 2022-09-14 NOTE — Hospital Course (Signed)
PMH of breast cancer, cervical cancer, Hodgkin's lymphoma, pituitary adenoma SP resection currently on chronic steroids, chronic respiratory failure secondary to pulmonary hypertension and right heart failure on 5 LPM, migraine, anxiety, depression, chronic pain, Raynaud's phenomenon, thyroid cancer, OSA. Present to the hospital with complaints of swelling of the left hand with fever and chills.  Found to have cellulitis.  Responding well to antibiotics.

## 2022-09-15 DIAGNOSIS — L03114 Cellulitis of left upper limb: Secondary | ICD-10-CM | POA: Diagnosis not present

## 2022-09-15 MED ORDER — DOXYCYCLINE HYCLATE 100 MG PO TABS
100.0000 mg | ORAL_TABLET | Freq: Two times a day (BID) | ORAL | Status: DC
  Administered 2022-09-15 – 2022-09-17 (×5): 100 mg via ORAL
  Filled 2022-09-15 (×5): qty 1

## 2022-09-15 MED ORDER — CEFDINIR 300 MG PO CAPS
300.0000 mg | ORAL_CAPSULE | Freq: Two times a day (BID) | ORAL | Status: DC
  Administered 2022-09-15 – 2022-09-17 (×4): 300 mg via ORAL
  Filled 2022-09-15 (×5): qty 1

## 2022-09-15 MED ORDER — SODIUM CHLORIDE 0.9 % IV SOLN
INTRAVENOUS | Status: DC | PRN
  Administered 2022-09-15: 10 mL via INTRAVENOUS

## 2022-09-15 MED ORDER — HYDROCORTISONE SOD SUC (PF) 100 MG IJ SOLR
100.0000 mg | Freq: Two times a day (BID) | INTRAMUSCULAR | Status: DC
  Administered 2022-09-15 – 2022-09-16 (×2): 100 mg via INTRAVENOUS
  Filled 2022-09-15 (×2): qty 2

## 2022-09-15 MED ORDER — ZOLPIDEM TARTRATE 5 MG PO TABS: 5.0000 mg | ORAL_TABLET | Freq: Every evening | ORAL | Status: DC | PRN

## 2022-09-15 MED ORDER — FLUCONAZOLE 100 MG PO TABS
200.0000 mg | ORAL_TABLET | Freq: Once | ORAL | Status: AC
  Administered 2022-09-15: 200 mg via ORAL
  Filled 2022-09-15: qty 2

## 2022-09-15 MED ORDER — CLONAZEPAM 0.5 MG PO TABS
0.5000 mg | ORAL_TABLET | Freq: Three times a day (TID) | ORAL | Status: DC
  Administered 2022-09-15 – 2022-09-17 (×6): 0.5 mg via ORAL
  Filled 2022-09-15 (×6): qty 1

## 2022-09-15 NOTE — Plan of Care (Signed)
  Problem: Activity: Goal: Ability to return to baseline activity level will improve Outcome: Progressing   Problem: Cardiovascular: Goal: Ability to achieve and maintain adequate cardiovascular perfusion will improve Outcome: Progressing Goal: Vascular access site(s) Level 0-1 will be maintained Outcome: Progressing   Problem: Skin Integrity: Goal: Skin integrity will improve Outcome: Progressing

## 2022-09-15 NOTE — Progress Notes (Signed)
[facility] Progress Note Patient: [name] PVX:[id number] DOB: Jan 18, 1971 DOA: 09/13/2022  DOS: the patient was seen and examined on 09/15/2022  Brief hospital course: PMH of breast cancer, cervical cancer, Hodgkin's lymphoma, pituitary adenoma SP resection currently on chronic steroids, chronic respiratory failure secondary to pulmonary hypertension and right heart failure on 5 LPM, migraine, anxiety, depression, chronic pain, Raynaud's phenomenon, thyroid cancer, OSA. Present to the hospital with complaints of swelling of the left hand with fever and chills.  Found to have cellulitis.  Responding well to antibiotics. Assessment and Plan: Severe sepsis due to cellulitis. Met SIRS criteria with hypotension, tachypnea. Lactic acid normal. Treated with IV fluids. Currently on IV vancomycin and ceftriaxone. Responding very well to the antibiotic. Blood cultures so far negative.  If remain negative tomorrow on 12/16.  Will transition to oral Omnicef Doxy combination for a total 7-day treatment course. Sepsis physiology resolved.  Adrenal crisis. Currently on Solu-Cortef 100 mg 3 times daily.  Will reduce to 100 mg twice daily. Monitor.  Intractable nausea. Patient reports that she has intractable nausea and unable to tolerate anything p.o. The time of my evaluation she did not have any nausea. She is allergic to multiple medications and pretty much all antiemetics. Currently on IV Benadryl. Recommend to transition to clonazepam for refractory nausea to scheduled basis and monitor response.  History of tachycardia. Hypertension. Chronic right sided Heart failure/diastolic heart failure. Follows up with cardiology outpatient. Recent right heart cath showed no evidence of PAH and normal filling pressures. On Entresto outpatient.  For now on hold due to hypotension. Normal coronaries.  Mild aortic stenosis.  Chronic respiratory failure. Currently stable on 4 LPM.   Monitor.  HLD. Continue statin.  GERD. Continue home regimen.  History of seizures. On Topamax, Lamictal. Continue.  Normocytic anemia. H&H stable. Monitor.  Anxiety and depression. Patient is on Wellbutrin, Lexapro and clonazepam.  Plans upon currently switch to scheduled from as needed.  Hypothyroidism. Continue Synthroid.  Poor IV access. Patient has an IV access in her right arm currently. Patient has history of vasculitis and difficult stick. Had multiple ports in the past with frequent infections. Okay to insert a PICC line if need for IV access. Will hold off on frequent blood draws.  Obesity Body mass index is 30.17 kg/m.  Placing the pt at higher risk of poor outcomes.  Goals of care conversation. I had a very prolonged conversation with family and patient on 12/16 with regards to goals of care. Patient on further conversation with regards to plans to going home and recommendation to transition to oral medications become concerned. On further questioning tells me that she would really like her central line/Port-A-Cath placed back as she is tired of being sticked multiple times and is afraid that she will have complications from multiple IV sticks including fibrosis or infections. She tells me that she her focus is supposed to be on quality of life only and not quantity of life going forward. On further questioning if she ought to have an infection due to the line what would be her preference, she tells me that she would like to have a home health nurse come to home, draw blood cultures every few days to make sure that she does not have any infection and should she have an infection would like to be treated for infection. I explained that that is not focusing on quality of life and if the decision is to focus on treatment of infections then we should  not put her in harm's way by placing the line in the first place. I explained that she continues to have significant  symptom burden but with her blood pressure it is difficult to manage it further than what is currently being done. Patient is currently active with [facility] palliative care.  Not on hospice. She does have significant respiratory illness and with her Addison's crisis her chances of surviving any infection is always less. Explained to them that currently with her being actively treated for infection it is not a good idea to even consider a central line placement. I recommend him to understand their goal of care better as treatment of infection and placement of line for comfort does not go hand-in-hand and continues to have further conversation with palliative care with regards to symptom control in future. Will increase Klonopin to 3 times daily.   Subjective: No nausea no vomiting.  No fever no chills.  Patient is using Benadryl for refractory nausea but has not had any vomiting in the hospital.  Patient is actually also able to tolerate oral diet.  Per RN reported some vaginal discomfort.  Physical Exam: General: Appear in mild distress; Cardiovascular: S1 and S2 Present, aortic systolic  Murmur, Respiratory: good respiratory effort, Bilateral Air entry present, CTA, no Crackles, no wheezes Abdomen: Bowel Sound present, Non tender  Extremities: Chronic appearing generalized anasarca, no significant pedal edema Neurology: alert and oriented to time, place, and person appears severely anxious.  Data Reviewed: I have Reviewed nursing notes, Vitals, and Lab results.   Disposition: Status is: Inpatient Remains inpatient appropriate because: Taper IV steroids.  Plan for discharge home tomorrow.  SCDs Start: 09/13/22 3539   Family Communication: Husband at bedside Level of care: Progressive continue progressive care due to hypotension. Vitals:   09/14/22 2122 09/15/22 0457 09/15/22 0907 09/15/22 1300  BP: (!) 107/55 (!) 99/56  120/80  Pulse: 98 88  92  Resp: _0 Temp: 98.3 F (36.8  C) 98.4 F (36.9 C)  98 F (36.7 C)  TempSrc: Oral Oral  Oral  SpO2: 100% 100% 100% 100%  Weight:      Height:         Author: Berle Mull, MD 09/15/2022 6:19 PM  Please look on [email or web address] to find out who is on call.

## 2022-09-15 NOTE — Progress Notes (Signed)
Patient refused to have bed alarm on. Provided patient with education and purpose of bed alarm. Will remain off at this time per patient refusal. Will continue to monitor.

## 2022-09-16 ENCOUNTER — Inpatient Hospital Stay (HOSPITAL_COMMUNITY): Payer: BC Managed Care – PPO

## 2022-09-16 DIAGNOSIS — L03114 Cellulitis of left upper limb: Secondary | ICD-10-CM | POA: Diagnosis not present

## 2022-09-16 MED ORDER — HYDROMORPHONE HCL 1 MG/ML IJ SOLN
0.2500 mg | INTRAMUSCULAR | Status: DC | PRN
  Administered 2022-09-16 – 2022-09-17 (×4): 0.25 mg via INTRAVENOUS
  Filled 2022-09-16 (×4): qty 0.5

## 2022-09-16 MED ORDER — HYDROCORTISONE 20 MG PO TABS: 30.0000 mg | ORAL_TABLET | Freq: Every day | ORAL | Status: DC

## 2022-09-16 MED ORDER — DIPHENHYDRAMINE HCL 12.5 MG/5ML PO ELIX: 25.0000 mg | ORAL_SOLUTION | Freq: Four times a day (QID) | ORAL | Status: DC | PRN

## 2022-09-16 MED ORDER — HYDROCORTISONE SOD SUC (PF) 100 MG IJ SOLR
50.0000 mg | Freq: Every day | INTRAMUSCULAR | Status: AC
  Administered 2022-09-16: 50 mg via INTRAVENOUS
  Filled 2022-09-16: qty 1

## 2022-09-16 MED ORDER — DIPHENHYDRAMINE HCL 50 MG/ML IJ SOLN: 25.0000 mg | Freq: Four times a day (QID) | INTRAMUSCULAR | Status: DC | PRN

## 2022-09-16 MED ORDER — BUDESONIDE 0.25 MG/2ML IN SUSP
0.2500 mg | Freq: Two times a day (BID) | RESPIRATORY_TRACT | Status: DC
  Administered 2022-09-16 – 2022-09-17 (×3): 0.25 mg via RESPIRATORY_TRACT
  Filled 2022-09-16 (×3): qty 2

## 2022-09-16 MED ORDER — BUMETANIDE 1 MG PO TABS
1.0000 mg | ORAL_TABLET | Freq: Two times a day (BID) | ORAL | Status: DC
  Administered 2022-09-16 – 2022-09-17 (×3): 1 mg via ORAL
  Filled 2022-09-16 (×3): qty 1

## 2022-09-16 MED ORDER — ARFORMOTEROL TARTRATE 15 MCG/2ML IN NEBU
15.0000 ug | INHALATION_SOLUTION | Freq: Two times a day (BID) | RESPIRATORY_TRACT | Status: DC
  Administered 2022-09-16 – 2022-09-17 (×3): 15 ug via RESPIRATORY_TRACT
  Filled 2022-09-16 (×3): qty 2

## 2022-09-16 MED ORDER — OXYCODONE-ACETAMINOPHEN 5-325 MG PO TABS: 1.0000 | ORAL_TABLET | ORAL | Status: DC | PRN

## 2022-09-16 MED ORDER — BUMETANIDE 1 MG PO TABS
1.0000 mg | ORAL_TABLET | Freq: Every day | ORAL | Status: DC
  Filled 2022-09-16: qty 1

## 2022-09-16 MED ORDER — PREDNISONE 20 MG PO TABS
40.0000 mg | ORAL_TABLET | Freq: Every day | ORAL | Status: DC
  Administered 2022-09-17: 40 mg via ORAL
  Filled 2022-09-16: qty 2

## 2022-09-16 MED ORDER — REVEFENACIN 175 MCG/3ML IN SOLN
175.0000 ug | Freq: Every day | RESPIRATORY_TRACT | Status: DC
  Administered 2022-09-16 – 2022-09-17 (×2): 175 ug via RESPIRATORY_TRACT
  Filled 2022-09-16 (×2): qty 3

## 2022-09-16 MED ORDER — POTASSIUM CHLORIDE CRYS ER 20 MEQ PO TBCR
20.0000 meq | EXTENDED_RELEASE_TABLET | Freq: Two times a day (BID) | ORAL | Status: DC
  Administered 2022-09-16 – 2022-09-17 (×3): 20 meq via ORAL
  Filled 2022-09-16 (×3): qty 1

## 2022-09-16 MED ORDER — HYDROMORPHONE HCL 1 MG/ML IJ SOLN: 0.5000 mg | INTRAMUSCULAR | Status: DC | PRN

## 2022-09-16 MED ORDER — DIPHENHYDRAMINE HCL 50 MG/ML IJ SOLN
12.5000 mg | Freq: Four times a day (QID) | INTRAMUSCULAR | Status: DC | PRN
  Administered 2022-09-16 – 2022-09-17 (×4): 12.5 mg via INTRAVENOUS
  Filled 2022-09-16 (×4): qty 1

## 2022-09-16 MED ORDER — HYDROCORTISONE 20 MG PO TABS: 20.0000 mg | ORAL_TABLET | Freq: Every evening | ORAL | Status: DC

## 2022-09-16 NOTE — Progress Notes (Signed)
[facility] Progress Note Patient: [name]:[id number] DOB: 1971-03-20 DOA: 09/13/2022  DOS: the patient was seen and examined on 09/16/2022  Brief hospital course: PMH of breast cancer, cervical cancer, Hodgkin's lymphoma, pituitary adenoma SP resection currently on chronic steroids, chronic respiratory failure secondary to pulmonary hypertension and right heart failure on 5 LPM, migraine, anxiety, depression, chronic pain, Raynaud's phenomenon, thyroid cancer, OSA. Present to the hospital with complaints of swelling of the left hand with fever and chills.  Found to have cellulitis.  Responding well to antibiotics. Assessment and Plan: Severe sepsis due to cellulitis. Met SIRS criteria with hypotension, tachypnea. Lactic acid normal. Treated with IV fluids.  Initially was on IV vancomycin and ceftriaxone. Blood cultures are negative therefore currently on oral antibiotics. Sepsis physiology resolved. While the patient is worried about her wounds and scars at present I do not see any of them being uncontrollably infected.  Will monitor.   Adrenal crisis. Initially was on Solu-Cortef 100 mg 3 times daily. We are tapering it off and transitioning to oral prednisone starting 12/18.   Intractable nausea. Patient reports that she has intractable nausea and unable to tolerate anything p.o. The time of my evaluation she did not have any nausea. She is allergic to multiple medications and pretty much all antiemetics. Currently on IV Benadryl. Recommend to transition to clonazepam for refractory nausea to scheduled basis and monitor response.   History of tachycardia. Hypertension. Chronic right sided Heart failure/diastolic heart failure. Follows up with cardiology outpatient. Recent right heart cath showed no evidence of PAH and normal filling pressures. On Entresto outpatient.  For now on hold due to hypotension. Normal coronaries.  Mild aortic stenosis.   Chronic  respiratory failure. Chronic diastolic CHF Currently stable on 4 LPM.  Monitor. On Entresto at home which is currently on hold. On Bumex at home which we will resume on 12/17.   HLD. Continue statin.   GERD. Continue home regimen.   History of seizures. On Topamax, Lamictal. Continue.   Normocytic anemia. H&H stable. Monitor.   Anxiety and depression. Patient is on Wellbutrin, Lexapro and clonazepam.  Klonopin currently switch to scheduled from as needed. Patient reports multiple allergies with all other potential medication for anxiety and depression.   Hypothyroidism. Continue Synthroid.   Poor IV access. Patient has an IV access in her right arm currently. Patient has history of vasculitis and difficult stick. Had multiple ports in the past with frequent infections. Okay to insert a PICC line if need for IV access. Will hold off on frequent blood draws.   Obesity Body mass index is 30.17 kg/m.  Placing the pt at higher risk of poor outcomes.   Goals of care conversation. Extensive discussion with patient on 12/16.  Please see the note for that. Further discussion on ptosis noted with regards to goals of care and symptom control. Recommend to switch to p.o. medication as the patient has not exhibited any vomiting episode here in the hospital.   Subjective: No nausea at the time of my evaluation no vomiting no fever no chills.  She is concerned that there are more blisters popping up she is concerned that there is more edema of her left upper extremity.  Physical Exam: General: in moderate distress;  Cardiovascular: S1 and S2 Present, aortic systolic  Murmur Respiratory: Normal respiratory effort, Bilateral Air entry present, bilateral  Crackles, no wheezes Abdomen: Bowel Sound present, Non tender  Extremities: Generalized anasarca which is chronic Neurology: alert and oriented to  time, place, and person   Data Reviewed: I have Reviewed nursing notes, Vitals,  and Lab results. Order chest x-ray.  Independently visualized.  Shows vascular congestion.  Disposition: Status is: Inpatient Remains inpatient appropriate because: Tapering of steroids and monitoring tolerance of oral Bumex.  SCDs Start: 09/13/22 0727   Family Communication: No one at bedside Level of care: Telemetry Continue telemetry for hypoxia Vitals:   09/16/22 1257 09/16/22 1416 09/16/22 1933 09/16/22 1934  BP:  133/74  127/76  Pulse:  (!) 105  (!) 104  Resp:  20  20  Temp:  98.3 F (36.8 C)  97.9 F (36.6 C)  TempSrc:  Oral  Oral  SpO2: 100% 96% 100% 100%  Weight:      Height:         Author: Berle Mull, MD 09/16/2022 8:40 PM  Please look on [email or web address] to find out who is on call.

## 2022-09-16 NOTE — Plan of Care (Signed)
  Problem: Coping: Goal: Level of anxiety will decrease Outcome: Not Progressing

## 2022-09-17 DIAGNOSIS — L03114 Cellulitis of left upper limb: Secondary | ICD-10-CM | POA: Diagnosis not present

## 2022-09-17 MED ORDER — BISOPROLOL FUMARATE 5 MG PO TABS: 2.5000 mg | ORAL_TABLET | Freq: Every day | ORAL | Status: DC

## 2022-09-17 MED ORDER — POTASSIUM CHLORIDE CRYS ER 20 MEQ PO TBCR: 20.0000 meq | EXTENDED_RELEASE_TABLET | Freq: Every day | ORAL | 0 refills | Status: DC

## 2022-09-17 MED ORDER — XEROFORM OCCLUSIVE GAUZE STRIP EX PADS: 1.0000 | MEDICATED_PAD | CUTANEOUS | 1 refills | Status: DC

## 2022-09-17 MED ORDER — DOXYCYCLINE HYCLATE 100 MG PO TABS: 100.0000 mg | ORAL_TABLET | Freq: Two times a day (BID) | ORAL | 0 refills | Status: AC

## 2022-09-17 MED ORDER — DIPHENHYDRAMINE HCL 12.5 MG/5ML PO ELIX: 25.0000 mg | ORAL_SOLUTION | Freq: Four times a day (QID) | ORAL | 0 refills | Status: DC | PRN

## 2022-09-17 MED ORDER — HYDROCORTISONE 10 MG PO TABS: 10.0000 mg | ORAL_TABLET | ORAL | Status: DC

## 2022-09-17 MED ORDER — ENTRESTO 24-26 MG PO TABS: 1.0000 | ORAL_TABLET | Freq: Two times a day (BID) | ORAL | 11 refills | Status: DC

## 2022-09-17 MED ORDER — MENTHOL 3 MG MT LOZG: 1.0000 | LOZENGE | OROMUCOSAL | 0 refills | Status: DC | PRN

## 2022-09-17 MED ORDER — OXYCODONE-ACETAMINOPHEN 5-325 MG PO TABS: 1.0000 | ORAL_TABLET | Freq: Three times a day (TID) | ORAL | 0 refills | Status: DC | PRN

## 2022-09-17 MED ORDER — PREDNISONE 10 MG PO TABS: ORAL_TABLET | ORAL | 0 refills | Status: DC

## 2022-09-17 MED ORDER — SILVER SULFADIAZINE 1 % EX CREA: TOPICAL_CREAM | CUTANEOUS | 1 refills | Status: AC

## 2022-09-17 MED ORDER — CEFDINIR 300 MG PO CAPS: 300.0000 mg | ORAL_CAPSULE | Freq: Two times a day (BID) | ORAL | 0 refills | Status: AC

## 2022-09-18 ENCOUNTER — Ambulatory Visit: Payer: Medicare Other | Admitting: Adult Health

## 2022-09-18 LAB — CULTURE, BLOOD (ROUTINE X 2)
Culture: NO GROWTH
Special Requests: ADEQUATE

## 2022-09-18 NOTE — Progress Notes (Signed)
Can you call patient and make sure CT was reviewed during her visit with Dr. Loanne Drilling. She saw her two days after

## 2022-09-18 NOTE — Discharge Summary (Signed)
Physician Discharge Summary   Patient: [name] MRN: [id number] DOB: Mar 29, 1971  Admit date:     09/13/2022  Discharge date: 09/17/2022  Discharge Physician: Berle Mull  PCP: Su Grand, MD  Recommendations at discharge: Follow up with PCP in 1 week Follow up with Palliative care, discuss further clarity on goals of care and discuss line placement for comfort if that is the goal.   Follow-up Information     Toppin, Antionette Poles, MD. Schedule an appointment as soon as possible for a visit in 1 week(s).   Specialty: Internal Medicine Contact information: [address] [place] [address] [phone]         [place]. Call.   Contact information: [place] [address]        Roseanne Kaufman, MD. Schedule an appointment as soon as possible for a visit.   Specialty: Orthopedic Surgery Why: As needed Contact information: [address] [place] [address] [phone]                Discharge Diagnoses: Principal Problem:   Cellulitis Active Problems:   Hypertension   GERD (gastroesophageal reflux disease)   Hyperlipidemia   Chronic respiratory failure with hypoxia (HCC)   Moderate persistent asthma without complication   Addison's disease ([place])   Seizure-like activity (HCC)   Normocytic anemia   Anxiety and depression   Postablative hypothyroidism   Grade I diastolic dysfunction  Hospital Course: PMH of breast cancer, cervical cancer, Hodgkin's lymphoma, pituitary adenoma SP resection currently on chronic steroids, chronic respiratory failure secondary to pulmonary hypertension and right heart failure on 5 LPM, migraine, anxiety, depression, chronic pain, Raynaud's phenomenon, thyroid cancer, OSA. Present to the hospital with complaints of swelling of the left hand with fever and chills.  Found to have cellulitis.  Responding well to antibiotics.  Assessment and Plan   Severe sepsis due to cellulitis. Met SIRS criteria with hypotension, tachypnea. Lactic acid normal. Treated with IV fluids.  Initially was on IV vancomycin and ceftriaxone. Blood cultures are negative therefore currently on oral antibiotics. Sepsis physiology resolved. While the patient is worried about her wounds and scars at present I do not see any of them are concerningly infected.    Adrenal crisis. Initially was on Solu-Cortef 100 mg 3 times daily. We are tapering it off and transitioning to oral prednisone starting 12/18.   Intractable nausea. Patient reports that she has intractable nausea and unable to tolerate anything p.o. The time of my evaluation she did not have any nausea. She is allergic to multiple medications and pretty much all antiemetics. Patient prefers to use IV Benadryl here in the hospital.  Has not had an episode of vomiting throughout her 4-day stay. Recommend to transition to clonazepam for refractory nausea to scheduled basis    History of tachycardia. Hypertension. Chronic right sided Heart failure/diastolic heart failure. Follows up with cardiology outpatient. Recent right heart cath showed no evidence of PAH and normal filling pressures. On Entresto outpatient.  For now on hold. Normal coronaries.  Mild aortic stenosis.   Chronic respiratory failure. Chronic diastolic CHF Currently stable on 4 LPM.  Monitor. On Entresto at home which is currently on hold. On Bumex at home which was resumed safely.   HLD. Continue statin.   GERD. Continue home regimen.   History of seizures. On Topamax, Lamictal. Continue.   Normocytic anemia. H&H stable. Monitor.   Anxiety and depression. Patient is on Wellbutrin, Lexapro  and clonazepam.  Klonopin currently switch to scheduled from as needed. Patient reports multiple allergies with all other potential medication for anxiety and depression.   Hypothyroidism. Continue Synthroid.   Poor IV  access. Patient has an IV access in her right arm currently. Patient has history of vasculitis and difficult stick. Had multiple ports in the past with frequent infections. Will hold off on frequent blood draws based on discussion with pt's goals.   Obesity Body mass index is 30.17 kg/m.  Placing the pt at higher risk of poor outcomes.   Goals of care conversation. Extensive discussion with patient on 12/16.  Please see the note for that. Further discussion with regards to goals of care and symptom control. Patient is completing goals of care.  She wants symptom control and quality of life but at the same time she wants treatment for medical conditions and is worried about infections.  She is tired of IV sticks and wants a central line but wants frequent blood draws for blood cultures to make sure she is not getting infected. She definitely has a significantly high symptom burden including pain, anxiety, nausea but at the same time also has reported "side effect" to multiple medications that might help her symptoms.  Patient will continue to benefit from psychotherapy outpatient as well as palliative care therapy outpatient to have more clarity on her goals of care.  Pain control - [employer] Controlled Substance Reporting System database was reviewed. and patient was instructed, not to drive, operate heavy machinery, perform activities at heights, swimming or participation in water activities or provide baby-sitting services while on Pain, Sleep and Anxiety Medications; until their outpatient Physician has advised to do so again. Also recommended to not to take more than prescribed Pain, Sleep and Anxiety Medications.  Consultants:  none  Procedures performed:  none  DISCHARGE MEDICATION: Allergies as of 09/17/2022       Reactions   Ferrous Bisglycinate Chelate [iron] Anaphylaxis, Other (See Comments)   Only IV - only FERRLICET   Mushroom Extract Complex Anaphylaxis   Na Ferric  Gluc Cplx In Sucrose Anaphylaxis   Cymbalta [duloxetine Hcl] Swelling, Anxiety   Hydromorphone Other (See Comments)   BP drop and heart rate drops 5.1.20 PT REPORTS THAT SHE TAKES DILAUDID AT HOME   Ondansetron Hcl Other (See Comments)   Promethazine Other (See Comments)   Other reaction(s): Unknown   Succinylcholine Other (See Comments)   Lock Jaw   Buprenorphine Hcl Hives   Compazine Other (See Comments)   Altered mental status Aggression   Duloxetine    Other reaction(s): Not available   Metoclopramide Other (See Comments)   Dystonia   Morphine And Related Hives   Ondansetron Hives, Rash   Other reaction(s): Unknown Other reaction(s): Unknown   Promethazine Hcl Hives   Tegaderm Ag Mesh [silver] Rash   Old formulation only, is able to tolerate new formulation        Medication List     STOP taking these medications    Incruse Ellipta 62.5 MCG/ACT Aepb Generic drug: umeclidinium bromide       TAKE these medications    aspirin EC 81 MG tablet Take 1 tablet (81 mg total) by mouth daily. Swallow whole. What changed:  when to take this additional instructions   benzonatate 200 MG capsule Commonly known as: TESSALON Take 200 mg by mouth 3 (three) times daily as needed for cough.   bisoprolol 5 MG tablet Commonly known as: ZEBETA Take 0.5 tablets (  2.5 mg total) by mouth daily. Take 0.5 tablet by mouth daily Start taking on: September 19, 2022   bumetanide 1 MG tablet Commonly known as: BUMEX TAKE 1 TABLET BY MOUTH TWICE A DAY   buPROPion 150 MG 24 hr tablet Commonly known as: WELLBUTRIN XL TAKE 1 TABLET BY MOUTH EVERY DAY   Cambia 50 MG Pack Generic drug: Diclofenac Potassium(Migraine) Take 50 mg by mouth daily as needed for migraine.   cefdinir 300 MG capsule Commonly known as: OMNICEF Take 1 capsule (300 mg total) by mouth 2 (two) times daily for 7 days.   clonazePAM 0.5 MG tablet Commonly known as: KlonoPIN Take 1 tablet (0.5 mg total) by mouth  3 (three) times daily as needed for anxiety.   dexlansoprazole 60 MG capsule Commonly known as: DEXILANT Take 60 mg by mouth daily.   DIPHENHYDRAMINE HCL IJ Inject 0.5 mLs into the muscle daily. What changed: Another medication with the same name was added. Make sure you understand how and when to take each.   diphenhydrAMINE 12.5 MG/5ML elixir Commonly known as: BENADRYL Take 10 mLs (25 mg total) by mouth every 6 (six) hours as needed (nausea). What changed: You were already taking a medication with the same name, and this prescription was added. Make sure you understand how and when to take each.   doxycycline 100 MG tablet Commonly known as: VIBRA-TABS Take 1 tablet (100 mg total) by mouth 2 (two) times daily for 7 days.   dronabinol 2.5 MG capsule Commonly known as: MARINOL TAKE 1 CAPSULE BY MOUTH TWICE A DAY AS NEEDED NAUSEA What changed: See the new instructions.   Emgality 120 MG/ML Sosy Generic drug: Galcanezumab-gnlm Inject 120 mg into the skin every 28 (twenty-eight) days.   Entresto 24-26 MG Generic drug: sacubitril-valsartan Take 1 tablet by mouth 2 (two) times daily. Start taking on: September 22, 2022 What changed: These instructions start on September 22, 2022. If you are unsure what to do until then, ask your doctor or other care provider.   EPINEPHrine 0.3 mg/0.3 mL Soaj injection Commonly known as: EPI-PEN Inject 0.3 mg into the muscle as needed for anaphylaxis.   escitalopram 20 MG tablet Commonly known as: LEXAPRO Take 1 tablet (20 mg total) by mouth every evening.   Florastor 250 MG capsule Generic drug: saccharomyces boulardii Take 250 mg by mouth See admin instructions. Take 250 mg by mouth mid-morning and mid-afternoon (1600)   fluticasone-salmeterol 230-21 MCG/ACT inhaler Commonly known as: ADVAIR HFA INHALE 2 PUFFS INTO THE LUNGS TWICE A DAY   guaiFENesin 100 MG/5ML liquid Commonly known as: ROBITUSSIN Take 15 mLs by mouth every 6 (six)  hours as needed for cough or to loosen phlegm.   hydrocortisone 10 MG tablet Commonly known as: CORTEF Take 1-2 tablets (10-20 mg total) by mouth See admin instructions. Take 20 mg in the am and 62m in the evening. Take after completing prednisone Start taking on: September 29, 2022 What changed: These instructions start on September 29, 2022. If you are unsure what to do until then, ask your doctor or other care provider.   lamoTRIgine 150 MG tablet Commonly known as: LAMICTAL Take one tablet at bedtime. What changed:  how much to take how to take this when to take this   menthol-cetylpyridinium 3 MG lozenge Commonly known as: CEPACOL Take 1 lozenge (3 mg total) by mouth as needed for sore throat.   nitroGLYCERIN 0.4 MG SL tablet Commonly known as: NITROSTAT Dissolve 1 tablet under the  tongue every 5 minutes as needed for chest pain. Max of 3 doses, then 911.   Nurtec 75 MG Tbdp Generic drug: Rimegepant Sulfate Take 75 mg by mouth daily as needed (Migraine).   OneTouch Delica Plus [id number] Misc 3 (three) times daily. for testing   OneTouch Verio test strip Generic drug: glucose blood 3 (three) times daily. for testing   oxyCODONE-acetaminophen 5-325 MG tablet Commonly known as: PERCOCET/ROXICET Take 1 tablet by mouth every 8 (eight) hours as needed for moderate pain or severe pain.   OXYGEN Inhale 4-5 L/min into the lungs continuous.   potassium chloride SA 20 MEQ tablet Commonly known as: KLOR-CON M Take 1 tablet (20 mEq total) by mouth daily for 3 days.   predniSONE 10 MG tablet Commonly known as: DELTASONE Take 4m daily for 3days,Take 330mdaily for 3days,Take 2044maily for 3days,Take 45m25mily for 3days, then stop and switch to cortef   ProAir HFA 108 (90 Base) MCG/ACT inhaler Generic drug: albuterol Inhale 2 puffs into the lungs every 4 (four) hours as needed for wheezing or shortness of breath.   albuterol (2.5 MG/3ML) 0.083% nebulizer  solution Commonly known as: PROVENTIL Take 3 mLs (2.5 mg total) by nebulization every 6 (six) hours as needed for wheezing or shortness of breath.   rosuvastatin 10 MG tablet Commonly known as: CRESTOR Take 10 mg by mouth daily. Mid morning   silver sulfADIAZINE 1 % cream Commonly known as: SILVADENE Apply to affected area daily   sitaGLIPtin 100 MG tablet Commonly known as: Januvia Take 1 tablet (100 mg total) by mouth daily.   Spiriva Respimat 1.25 MCG/ACT Aers Generic drug: Tiotropium Bromide Monohydrate Inhale 2 puffs into the lungs daily.   sucralfate 1 GM/10ML suspension Commonly known as: CARAFATE Take 1 g by mouth daily as needed (as directed for ulcers).   Synthroid 100 MCG tablet Generic drug: levothyroxine Take 1 tablet (100 mcg total) by mouth every morning. What changed: additional instructions   topiramate 50 MG tablet Commonly known as: TOPAMAX Take 150 mg by mouth at bedtime.   Xeroform Occlusive Gauze Strip Pads Apply 1 each topically as directed.   zolpidem 12.5 MG CR tablet Commonly known as: AMBIEN CR Take 12.5 mg by mouth at bedtime.       Disposition: Home Diet recommendation: Cardiac diet  Discharge Exam: Vitals:   09/16/22 1934 09/17/22 0510 09/17/22 0753 09/17/22 0802  BP: 127/76 113/61    Pulse: (!) 104 88    Resp: 20 20    Temp: 97.9 F (36.6 C) 97.9 F (36.6 C)    TempSrc: Oral Oral    SpO2: 100% 100% 99% 99%  Weight:      Height:       General: Appear in mild distress; no visible Abnormal Neck Mass Or lumps, Conjunctiva normal Cardiovascular: S1 and S2 Present, aortic systolic  Murmur, Respiratory: good respiratory effort, Bilateral Air entry present and no Crackles, Occasional  wheezes Abdomen: Bowel Sound present, Non tender  Extremities: trace Pedal edema Neurology: alert and oriented to time, place, and person  Filed Weights   09/13/22 0333 09/13/22 2144  Weight: 81.6 kg 82.2 kg   Condition at discharge:  stable  The results of significant diagnostics from this hospitalization (including imaging, microbiology, ancillary and laboratory) are listed below for reference.   Imaging Studies: DG CHEST PORT 1 VIEW  Result Date: 09/16/2022 CLINICAL DATA:  Shortness of breath EXAM: PORTABLE CHEST 1 VIEW COMPARISON:  09/13/2022 FINDINGS: The heart size  and mediastinal contours are within normal limits. Prominent interstitial markings bilaterally, which may be accentuated by low lung volumes. No pleural effusion or pneumothorax. The visualized skeletal structures are unremarkable. IMPRESSION: Prominent interstitial markings bilaterally, which may be accentuated by low lung volumes. Mild edema not excluded. Electronically Signed   By: Davina Poke D.O.   On: 09/16/2022 10:24   DG Chest 1 View  Result Date: 09/13/2022 CLINICAL DATA:  Hand redness and swelling EXAM: CHEST  1 VIEW COMPARISON:  07/17/2022 FINDINGS: Low volume chest with interstitial crowding. Normal heart size. No effusion or pneumothorax. Sequela bilateral breast/axilla surgery. IMPRESSION: Low volume chest without acute or focal finding. Electronically Signed   By: Jorje Guild M.D.   On: 09/13/2022 05:00   DG Hand Complete Left  Result Date: 09/13/2022 CLINICAL DATA:  Redness and swelling.  Blisters and pain. EXAM: LEFT HAND - COMPLETE 3+ VIEW COMPARISON:  None Available. FINDINGS: Proximal interphalangeal index finger amputation. No fracture, erosion, or aggressive bone lesion. Soft tissue swelling especially at the dorsal hand and wrist. No opaque foreign body. IMPRESSION: Soft tissue swelling without gas, opaque foreign body, or osseous abnormality. Electronically Signed   By: Jorje Guild M.D.   On: 09/13/2022 04:55   CARDIAC CATHETERIZATION  Result Date: 08/31/2022   The left ventricular ejection fraction is 55-65% by visual estimate. Findings: Ao = 90/54 (71) LV = 103/13 RA =  4 RV =  26/5 PA =  23/9 (17) PCW = 10 Fick cardiac  output/index = 6.3/3.3 PVR = 1.2 WU FA sat = 99% PA sat = 71%, 71% AoV with mid stenosis: Mean gradient 44mHG AVA 1.99cm2 Assessment: 1. EF 55-65% 2. Normal coronaries 3. Normal filling pressures with no evidence of PAH 4. Very mild aortic stenosis Plan/Discussion: Continue medical therapy. DGlori Bickers MD 9:20 AM   Microbiology: Results for orders placed or performed during the hospital encounter of 09/13/22  Resp panel by RT-PCR (RSV, Flu A&B, Covid) Anterior Nasal Swab     Status: None   Collection Time: 09/13/22  3:56 AM   Specimen: Anterior Nasal Swab  Result Value Ref Range Status   SARS Coronavirus 2 by RT PCR NEGATIVE NEGATIVE Final    Comment: (NOTE) SARS-CoV-2 target nucleic acids are NOT DETECTED.  The SARS-CoV-2 RNA is generally detectable in upper respiratory specimens during the acute phase of infection. The lowest concentration of SARS-CoV-2 viral copies this assay can detect is 138 copies/mL. A negative result does not preclude SARS-Cov-2 infection and should not be used as the sole basis for treatment or other patient management decisions. A negative result may occur with  improper specimen collection/handling, submission of specimen other than nasopharyngeal swab, presence of viral mutation(s) within the areas targeted by this assay, and inadequate number of viral copies(<138 copies/mL). A negative result must be combined with clinical observations, patient history, and epidemiological information. The expected result is Negative.  Fact Sheet for Patients:  hEntrepreneurPulse.com.au Fact Sheet for Healthcare Providers:  hIncredibleEmployment.be This test is no t yet approved or cleared by the [place] and  has been authorized for detection and/or diagnosis of SARS-CoV-2 by FDA under an Emergency Use Authorization (EUA). This EUA will remain  in effect (meaning this test can be used) for the duration of the COVID-19  declaration under Section 564(b)(1) of the Act, 21 U.S.C.section 360bbb-3(b)(1), unless the authorization is terminated  or revoked sooner.       Influenza A by PCR NEGATIVE NEGATIVE Final  Influenza B by PCR NEGATIVE NEGATIVE Final    Comment: (NOTE) The Xpert Xpress SARS-CoV-2/FLU/RSV plus assay is intended as an aid in the diagnosis of influenza from Nasopharyngeal swab specimens and should not be used as a sole basis for treatment. Nasal washings and aspirates are unacceptable for Xpert Xpress SARS-CoV-2/FLU/RSV testing.  Fact Sheet for Patients: EntrepreneurPulse.com.au  Fact Sheet for Healthcare Providers: IncredibleEmployment.be  This test is not yet approved or cleared by the [place] FDA and has been authorized for detection and/or diagnosis of SARS-CoV-2 by FDA under an Emergency Use Authorization (EUA). This EUA will remain in effect (meaning this test can be used) for the duration of the COVID-19 declaration under Section 564(b)(1) of the Act, 21 U.S.C. section 360bbb-3(b)(1), unless the authorization is terminated or revoked.     Resp Syncytial Virus by PCR NEGATIVE NEGATIVE Final    Comment: (NOTE) Fact Sheet for Patients: EntrepreneurPulse.com.au  Fact Sheet for Healthcare Providers: IncredibleEmployment.be  This test is not yet approved or cleared by the [place] FDA and has been authorized for detection and/or diagnosis of SARS-CoV-2 by FDA under an Emergency Use Authorization (EUA). This EUA will remain in effect (meaning this test can be used) for the duration of the COVID-19 declaration under Section 564(b)(1) of the Act, 21 U.S.C. section 360bbb-3(b)(1), unless the authorization is terminated or revoked.  Performed at [facility], [place] [address], [place], [place] [address]   Blood culture (routine x 2)     Status: None   Collection Time: 09/13/22   4:22 AM   Specimen: BLOOD  Result Value Ref Range Status   Specimen Description   Final    BLOOD RIGHT ARM Performed at [facility] [address], [place], [place] [address]    Special Requests   Final    BOTTLES DRAWN AEROBIC AND ANAEROBIC Blood Culture adequate volume Performed at [facility] [address], [place], [place] [address]    Culture   Final    NO GROWTH 5 DAYS Performed at [facility], [place] [address] [place], [place] [address]    Report Status 09/18/2022 FINAL  Final  Urine Culture     Status: None   Collection Time: 09/13/22  1:29 PM   Specimen: Urine, Clean Catch  Result Value Ref Range Status   Specimen Description   Final    URINE, CLEAN CATCH Performed at [facility], [place] [address] [place], [place] [address]    Special Requests   Final    NONE Performed at [facility], [place] [address] [place], [place] [address]    Culture   Final    NO GROWTH Performed at [facility], [place] [address] [place],  [address]    Report Status 09/14/2022 FINAL  Final   *Note: Due to a large number of results and/or encounters for the requested time period, some results have not been displayed. A complete set of results can be found in Results Review.   Labs: CBC: Recent Labs  Lab 09/13/22 0427  WBC 10.0  NEUTROABS 4.8  HGB 9.9*  HCT 33.5*  MCV 82.7  PLT 676   Basic Metabolic Panel: Recent Labs  Lab 09/13/22 0427  NA 139  K 3.5  CL 104  CO2 25  GLUCOSE 83  BUN 26*  CREATININE 1.41*  CALCIUM 8.9   Liver Function Tests: Recent Labs  Lab 09/13/22 0427  AST 25  ALT  25  ALKPHOS 64  BILITOT 0.5  PROT 7.6  ALBUMIN 4.3   CBG: No results for input(s): "GLUCAP" in the last 168 hours.  Discharge time spent: greater than 30 minutes.  Signed: Berle Mull, MD [facility] 09/17/2022

## 2022-09-19 ENCOUNTER — Ambulatory Visit: Payer: BC Managed Care – PPO | Admitting: Psychiatry

## 2022-09-19 DIAGNOSIS — F411 Generalized anxiety disorder: Secondary | ICD-10-CM | POA: Diagnosis not present

## 2022-09-19 NOTE — Progress Notes (Signed)
[facility] Counselor/Therapist Progress Note  Patient ID: [name], MRN: [id number],    Date: 09/19/2022  Time Spent: 50 minutes  Virtual Visit via Telehealth Note: [place] with patient by a telemedicine/telehealth application, with their informed consent, and verified patient privacy and that I am speaking with the correct person using two identifiers. I discussed the limitations, risks, security and privacy concerns of performing psychotherapy and the availability of in person appointments. I also discussed with the patient that there may be a patient responsible charge related to this service. The patient expressed understanding and agreed to proceed. I discussed the treatment planning with the patient. The patient was provided an opportunity to ask questions and all were answered. The patient agreed with the plan and demonstrated an understanding of the instructions. The patient was advised to call  our office if  symptoms worsen or feel they are in a crisis state and need immediate contact.   Therapist Location: office Patient Location: home   Treatment Type: Individual Therapy  Reported Symptoms: anxiety, some anger, depression, "I'm really good at helping other patients but not myself", frustrations  Mental Status Exam:  Appearance:   Casual     Behavior:  Appropriate, Sharing, and Motivated  Motor:  Normal  Speech/Language:   Negative  Affect:  Anxious, depressed, anger  Mood:  anxious, depressed, and irritable  Thought process:  normal  Thought content:    WNL  Sensory/Perceptual disturbances:    WNL  Orientation:  oriented to person, place, time/date, situation, day of week, month of year, year, and stated date of Dec. 20, 2023  Attention:  Good  Concentration:  Good  Memory:  Some overthinking and overanalyzing  Fund of knowledge:   Good  Insight:    Good and Fair  Judgment:   Good  Impulse Control:  Good   Risk Assessment: Danger to  Self:  No Self-injurious Behavior: No Danger to Others: No Duty to Warn:no Physical Aggression / Violence:No  Access to Firearms a concern: No  Gang Involvement:No   Subjective: Patient today sharing her difficulties with her medical conditions and with family relationships. Clearly needed to vent her thoughts/feelings regarding her ongoing cancer issues and shares how doctors have explained to her that she is not getting better and is on palliative care.  Encouraged her processing further on things she needed to say and feel heard, not interrupted. Concerns re: herself, family, and she was able to confidentially share these and get support and just have someone be present for her. Brother remains strongest family supporter. Processed her feelings about the holidays which used to be very special for her and "loved to decorate." Daughter still not having any contact with patient. Feels useless at times. Hurt by comments of some family members. Less self-blaming. A few neighbors are supportive and stay in contact.  Interventions: Cognitive Behavioral Therapy, Solution-Oriented/Positive Psychology, and Ego-Supportive  Long-term goal:  Patient will confront her anxiety, fears, anger reported re: her illness and use time in therapy and beyond to work on this, while receiving feedback and support from therapist. Help her determine what she feels is realistic for her considering her health challenges and difficult prognosis.  Short-term goal: Participate in therapy and initiate communication of needs and desires at this point in her struggle physically.  Strategies: Share and talk through anxiety, anger, and depression that are experienced and work to find and use helpful coping tools to better manage those feelings.  Diagnosis:   ICD-10-CM   1. Generalized anxiety disorder  F41.1      Plan: Patient today showing good motivation as she actively participated in session despite her very serious and  unpredictable health condition.  She remains on palliative care and is very active in the sessions we have mostly via MyChart video telehealth.  Work today further on her anxiety and depression as well as some anger towards certain people within the family.  Really needs session today to vent some recent family talk and comments made that were hurtful.  She was able to be very open in session to share and work through these and was much more calm by end of session and seem to feel supported.  Less blaming overall.  Thankful for the people that are still supportive of her and stay in contact including a few of her neighbors and some of her family members.  Denies any SI.  It does seem physically she has gotten some worse but she participated very well in the session today regarding her emotional needs and goal-directed behaviors. Encourage patient in goal-directed behaviors and physical challenges, especially in her own self-care, freely asking for help when she needs it.  Reviewed information she shared from medical staff that they are treating symptoms as they know they cannot cure her illness and that she is a "DNR" patient with which she agreed when that decision was made.  Patient is to secure next appointment and to be scheduled within approximately 2 to 3 weeks based on the scheduling of her medical appointments and treatment.  Goal review and progress/challenges noted with patient.  Next appointment within 3 weeks.  This record has been created using [employer].  Chart creation errors have been sought, but may not always have been located and corrected.  Such creation errors do not reflect on the standard of medical care provided. Shanon Ace, LCSW

## 2022-09-19 NOTE — Progress Notes (Signed)
Called patient.  She has not reviewed CT with Dr. Loanne Drilling. Patient states she just got out of the hospital Monday 09/17/22.  She is having burning again in her lungs and severe congestion. Informed patient to go to ED if any SOB or dyspnea.  Patient states she does not feel like she should go to the hospital at this time.  She is about the same as when she left the hospital.  Please advise on CT scan per patient.  Patient verbalized understanding to go to the ED if symptoms worsen.

## 2022-09-20 ENCOUNTER — Encounter: Payer: Self-pay | Admitting: Cardiovascular Disease

## 2022-09-25 ENCOUNTER — Encounter (HOSPITAL_BASED_OUTPATIENT_CLINIC_OR_DEPARTMENT_OTHER): Payer: Self-pay | Admitting: Pulmonary Disease

## 2022-09-25 ENCOUNTER — Telehealth: Payer: Self-pay | Admitting: Pulmonary Disease

## 2022-09-25 DIAGNOSIS — J9611 Chronic respiratory failure with hypoxia: Secondary | ICD-10-CM | POA: Diagnosis not present

## 2022-09-26 NOTE — Telephone Encounter (Signed)
Mychart message sent.

## 2022-09-26 NOTE — Progress Notes (Signed)
CT showed no evidence of fibrotic lung disease. Small pulmonary nodules measuring 0.70m or less. Trace fluid lung bases. Mild tracheobronchomalacia (weakened wind pipe).  She will need repeat imaging in 6-12 months. Nothing acutely concerning- review at her visit in January with Dr. ELoanne Drilling

## 2022-09-28 ENCOUNTER — Other Ambulatory Visit (HOSPITAL_COMMUNITY): Payer: Self-pay | Admitting: Endocrinology

## 2022-09-28 DIAGNOSIS — E271 Primary adrenocortical insufficiency: Secondary | ICD-10-CM

## 2022-10-02 ENCOUNTER — Other Ambulatory Visit: Payer: Self-pay | Admitting: Adult Health

## 2022-10-02 ENCOUNTER — Other Ambulatory Visit: Payer: Self-pay | Admitting: Hematology and Oncology

## 2022-10-02 DIAGNOSIS — L03114 Cellulitis of left upper limb: Secondary | ICD-10-CM | POA: Diagnosis not present

## 2022-10-02 DIAGNOSIS — C800 Disseminated malignant neoplasm, unspecified: Secondary | ICD-10-CM | POA: Diagnosis not present

## 2022-10-02 DIAGNOSIS — F419 Anxiety disorder, unspecified: Secondary | ICD-10-CM | POA: Diagnosis not present

## 2022-10-02 DIAGNOSIS — F5101 Primary insomnia: Secondary | ICD-10-CM | POA: Diagnosis not present

## 2022-10-02 DIAGNOSIS — Z79899 Other long term (current) drug therapy: Secondary | ICD-10-CM | POA: Diagnosis not present

## 2022-10-08 ENCOUNTER — Other Ambulatory Visit: Payer: Self-pay | Admitting: Internal Medicine

## 2022-10-08 DIAGNOSIS — E271 Primary adrenocortical insufficiency: Secondary | ICD-10-CM

## 2022-10-09 ENCOUNTER — Other Ambulatory Visit (HOSPITAL_COMMUNITY): Payer: Self-pay | Admitting: Endocrinology

## 2022-10-09 ENCOUNTER — Ambulatory Visit (HOSPITAL_COMMUNITY)
Admission: RE | Admit: 2022-10-09 | Discharge: 2022-10-09 | Disposition: A | Discharge status: Home / Self Care | Payer: BC Managed Care – PPO | Source: Ambulatory Visit | Attending: Endocrinology | Admitting: Endocrinology

## 2022-10-09 ENCOUNTER — Other Ambulatory Visit: Payer: Self-pay

## 2022-10-09 ENCOUNTER — Encounter (HOSPITAL_COMMUNITY): Payer: Self-pay

## 2022-10-09 DIAGNOSIS — E271 Primary adrenocortical insufficiency: Secondary | ICD-10-CM | POA: Diagnosis not present

## 2022-10-09 DIAGNOSIS — Z66 Do not resuscitate: Secondary | ICD-10-CM | POA: Diagnosis not present

## 2022-10-09 DIAGNOSIS — Z452 Encounter for adjustment and management of vascular access device: Secondary | ICD-10-CM | POA: Diagnosis not present

## 2022-10-09 HISTORY — PX: IR FLUORO GUIDE CV LINE RIGHT: IMG2283

## 2022-10-09 HISTORY — PX: IR US GUIDE VASC ACCESS RIGHT: IMG2390

## 2022-10-09 MED ORDER — HEPARIN SOD (PORK) LOCK FLUSH 100 UNIT/ML IV SOLN
INTRAVENOUS | Status: AC
  Administered 2022-10-09: 500 [IU] via INTRAVENOUS
  Filled 2022-10-09: qty 5

## 2022-10-09 MED ORDER — HYDROCORTISONE SOD SUC (PF) 100 MG IJ SOLR
100.0000 mg | INTRAMUSCULAR | Status: DC
  Filled 2022-10-09: qty 2

## 2022-10-09 MED ORDER — SODIUM CHLORIDE 0.9 % IV SOLN: INTRAVENOUS | Status: DC

## 2022-10-09 MED ORDER — LIDOCAINE HCL 1 % IJ SOLN
INTRAMUSCULAR | Status: AC
  Administered 2022-10-09: 17 mL via INTRADERMAL
  Filled 2022-10-09: qty 20

## 2022-10-09 NOTE — Procedures (Signed)
Interventional Radiology Procedure Note  Procedure: Placement of a right IJ approach single lumen tunneled CVC. 18cm, cuffed.  Tip is positioned at the superior cavoatrial junction and catheter is ready for immediate use.  Complications: None Recommendations:  - Ok to use - Do not submerge - Routine line care  - Patient tells me she feels fine, and is deferring the originally planned steroid IV.   Signed,  Dulcy Fanny. Earleen Newport, DO

## 2022-10-09 NOTE — Progress Notes (Signed)
Chief Complaint: Patient was seen in consultation today for durable central venous access   Referring Physician(s): Balan,Bindubal  Supervising Physician: Corrie Mckusick  Patient Status: [facility] - Out-pt  History of Present Illness: [name] is a 52 y.o. female with complicated medical history of numerous malignancies as well as Addison's. She also has significant venous access issues and over the past several years has had numerous port placement s and subsequent removal mostly due to bacteremia or skin/wound breakdown. She describes how she even develops significant wound issues from minor skin trauma. Her last port (right side) was removed in June 2023.  She is referred back to IR for possible port placement.  Long discussion with pt today.  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 STATUS POST RECONSTRUCTION. SHE HAD SILICONE BREAST IMPLANTS AND THE LEFT IMPLANT IS LEAKING SLIGHTLY   Cellulitis of right middle finger 11/07/2018   Cervical cancer ([place]) 12/23/2018   Chest pain    CHF with right heart failure ([place]) 04/17/2017   Chronic respiratory failure with hypoxia (HCC) 12/23/2018   Cough variant asthma 04/13/2019   Depression    GERD (gastroesophageal reflux disease)    takes Dexilant and carafate and gi coctail    Headache    migraines on a daily and monthly regimen    Heart murmur    History of kidney stones    Hodgkin lymphoma ([place])    STATUS POST MANTLE RADIATION   Hodgkin's lymphoma ([place])    1987   Hypertension    Hypoxia    Necrotizing fasciitis ([place]) 12/23/2018   Non-ischemic cardiomyopathy ([place])    Osteoporosis    Palpitations    Pituitary adenoma ([place]) 12/23/2018   Pneumonia    PONV (postoperative nausea and vomiting)    Pre-diabetes     per pt; no meds   Pulmonary hypertension ([place]) 12/23/2018   Raynaud phenomenon    Right heart failure ([place]) 04/17/2017   Seizures ([place])    last febrile seizure was approx 3 weeks ago per report on 12/01/2020   Sleep apnea    upcoming sleep study per pt    Supplemental oxygen dependent    3 liters   SVT (supraventricular tachycardia)    Tachycardia    Thyroid cancer ([place])    STATUS POST SURGICAL REMOVAL-CURRENT ON THYROID REPLACEMENT    Past Surgical History:  Procedure Laterality Date   ABDOMINAL HYSTERECTOMY     AMPUTATION Left 01/30/2019   Procedure: Left Index finger amputation with flap reconstruction and repair reconstruction;  Surgeon: Roseanne Kaufman, MD;  Location: [facility];  Service: Orthopedics;  Laterality: Left;   APPENDECTOMY     breast implants and removal      breast implants but leaking      CARDIAC CATHETERIZATION  05/18/09   NORMAL CATH   COLONOSCOPY     hx of chemotherapy      hx of radiation therapy      I & D EXTREMITY Left 12/23/2018   Procedure: IRRIGATION AND DEBRIDEMENT HAND / INDEX FINGER;  Surgeon: Roseanne Kaufman, MD;  Location: [facility];  Service: Orthopedics;  Laterality: Left;   IR CV LINE INJECTION  03/22/2022   IR IMAGING GUIDED PORT INSERTION  05/06/2020   IR IMAGING GUIDED PORT INSERTION  12/04/2021   IR REMOVAL TUN ACCESS W/ PORT W/O FL MOD SED  04/27/2020   IR REMOVAL TUN ACCESS W/ PORT W/O FL MOD SED  12/04/2021   IR REMOVAL TUN ACCESS W/ PORT W/O FL MOD SED  03/30/2022   KIDNEY STONE SURGERY     LUMBAR PUNCTURE W/ INTRATHECAL CHEMOTHERAPY     MASTECTOMY     PITUITARY SURGERY     RIGHT/LEFT HEART CATH AND CORONARY ANGIOGRAPHY N/A 04/02/2018   Procedure: RIGHT/LEFT HEART CATH AND CORONARY ANGIOGRAPHY;  Surgeon: Burnell Blanks, MD;  Location: [facility];  Service: Cardiovascular;  Laterality: N/A;   RIGHT/LEFT HEART CATH AND CORONARY ANGIOGRAPHY N/A 08/31/2022   Procedure: RIGHT/LEFT HEART CATH AND CORONARY ANGIOGRAPHY;  Surgeon: Jolaine Artist, MD;  Location: [facility];  Service: Cardiovascular;  Laterality: N/A;   TOOTH EXTRACTION N/A 12/05/2020   Procedure: DENTAL RESTORATION/EXTRACTIONS;  Surgeon: Ronal Fear, MD;  Location: [facility];  Service: Oral Surgery;  Laterality: N/A;   TOTAL THYROIDECTOMY     VIDEO BRONCHOSCOPY Bilateral 11/14/2018   Procedure: VIDEO BRONCHOSCOPY WITHOUT FLUORO;  Surgeon: Margaretha Seeds, MD;  Location: [facility];  Service: Cardiopulmonary;  Laterality: Bilateral;   VIDEO BRONCHOSCOPY WITH ENDOBRONCHIAL ULTRASOUND N/A 11/19/2018   Procedure: VIDEO BRONCHOSCOPY WITH ENDOBRONCHIAL ULTRASOUND;  Surgeon: Margaretha Seeds, MD;  Location: [facility];  Service: Thoracic;  Laterality: N/A;    Allergies: Ferrous bisglycinate chelate [iron], Mushroom extract complex, Na ferric gluc cplx in sucrose, Cymbalta [duloxetine hcl], Hydromorphone, Ondansetron hcl, Promethazine, Succinylcholine, Buprenorphine hcl, Compazine, Duloxetine, Metoclopramide, Morphine and related, Ondansetron, Promethazine hcl, and Tegaderm ag mesh [silver]  Medications: Prior to Admission medications   Medication Sig Start Date End Date Taking? Authorizing Provider  albuterol (PROVENTIL) (2.5 MG/3ML) 0.083% nebulizer solution Take 3 mLs (2.5 mg total) by nebulization every 6 (six) hours as needed for wheezing or shortness of breath. 07/09/22 10/07/22  Pokhrel, Corrie Mckusick, MD  aspirin EC 81 MG tablet Take 1 tablet (81 mg total) by mouth daily. Swallow whole. Patient taking differently: Take 81 mg by mouth at bedtime. Chewable 07/16/22   Nahser, Wonda Cheng, MD  benzonatate (TESSALON) 200 MG capsule Take 200 mg by mouth 3 (three) times daily as needed for cough.    [provider]  Bismuth Tribromoph-Petrolatum (XEROFORM OCCLUSIVE GAUZE STRIP) PADS Apply 1 each topically as directed. 09/17/22   Lavina Hamman, MD  bisoprolol (ZEBETA) 5 MG tablet Take 0.5 tablets (2.5 mg total) by mouth daily. Take 0.5 tablet by mouth daily  09/19/22   Lavina Hamman, MD  bumetanide (BUMEX) 1 MG tablet TAKE 1 TABLET BY MOUTH TWICE A DAY 09/04/22   Nahser, Wonda Cheng, MD  buPROPion (WELLBUTRIN XL) 150 MG 24 hr tablet TAKE 1 TABLET BY MOUTH EVERY DAY 10/03/22   Mozingo, Berdie Ogren, NP  clonazePAM (KLONOPIN) 0.5 MG tablet Take 1 tablet (0.5 mg total) by mouth 3 (three) times daily as needed for anxiety. 08/13/22   Mozingo, Berdie Ogren, NP  dexlansoprazole (DEXILANT) 60 MG capsule Take 60 mg by mouth daily.    [provider]  Diclofenac Potassium,Migraine, (CAMBIA) 50 MG PACK Take 50 mg by mouth daily as needed for migraine. 05/16/21   [provider]  diphenhydrAMINE (BENADRYL) 12.5 MG/5ML elixir Take 10 mLs (25 mg total) by mouth every 6 (six) hours as needed (nausea). 09/17/22   Lavina Hamman, MD  DIPHENHYDRAMINE HCL IJ Inject 0.5  mLs into the muscle daily.    [provider]  dronabinol (MARINOL) 2.5 MG capsule TAKE 1 CAPSULE BY MOUTH TWICE A DAY AS NEEDED NAUSEA Patient taking differently: Take 2.5 mg by mouth at bedtime. 09/10/22   Nicholas Lose, MD  EMGALITY 120 MG/ML SOSY Inject 120 mg into the skin every 28 (twenty-eight) days.    [provider]  EPINEPHrine 0.3 mg/0.3 mL IJ SOAJ injection Inject 0.3 mg into the muscle as needed for anaphylaxis. 06/13/21   Margaretha Seeds, MD  escitalopram (LEXAPRO) 20 MG tablet Take 1 tablet (20 mg total) by mouth every evening. 11/08/21   Mozingo, Berdie Ogren, NP  FLORASTOR 250 MG capsule Take 250 mg by mouth See admin instructions. Take 250 mg by mouth mid-morning and mid-afternoon (1600)    [provider]  fluticasone-salmeterol (ADVAIR HFA) 230-21 MCG/ACT inhaler INHALE 2 PUFFS INTO THE LUNGS TWICE A DAY 08/15/22   Margaretha Seeds, MD  guaiFENesin (ROBITUSSIN) 100 MG/5ML liquid Take 15 mLs by mouth every 6 (six) hours as needed for cough or to loosen phlegm. 07/09/22   Pokhrel, Corrie Mckusick, MD  hydrocortisone (CORTEF) 10 MG tablet Take 1-2  tablets (10-20 mg total) by mouth See admin instructions. Take 20 mg in the am and 13m in the evening. Take after completing prednisone 09/29/22   PLavina Hamman MD  lamoTRIgine (LAMICTAL) 150 MG tablet Take one tablet at bedtime. Patient taking differently: Take 150 mg by mouth at bedtime. Take one tablet at bedtime. 06/19/22   Mozingo, RBerdie Ogren NP  Lancets ([facility] Surgery LLCDELICA PLUS [id number] MISC 3 (three) times daily. for testing 06/12/21   [provider]  menthol-cetylpyridinium (CEPACOL) 3 MG lozenge Take 1 lozenge (3 mg total) by mouth as needed for sore throat. 09/17/22   PLavina Hamman MD  nitroGLYCERIN (NITROSTAT) 0.4 MG SL tablet Dissolve 1 tablet under the tongue every 5 minutes as needed for chest pain. Max of 3 doses, then 911. 08/20/22   Nahser, PWonda Cheng MD  [facility] Hospital[facility] test strip 3 (three) times daily. for testing 06/12/21   [provider]  oxyCODONE-acetaminophen (PERCOCET/ROXICET) 5-325 MG tablet Take 1 tablet by mouth every 8 (eight) hours as needed for moderate pain or severe pain. 09/17/22   PLavina Hamman MD  OXYGEN Inhale 4-5 L/min into the lungs continuous.    [provider]  potassium chloride SA (KLOR-CON M) 20 MEQ tablet Take 1 tablet (20 mEq total) by mouth daily for 3 days. 09/17/22 09/20/22  PLavina Hamman MD  predniSONE (DELTASONE) 10 MG tablet Take 429mdaily for 3days,Take 3037maily for 3days,Take 76m70mily for 3days,Take 10mg60mly for 3days, then stop and switch to cortef 09/17/22   PatelLavina Hamman PROAIR HFA 108 ([phone] [phone]) MCG/ACT inhaler Inhale 2 puffs into the lungs every 4 (four) hours as needed for wheezing or shortness of breath.    [provider]  Rimegepant Sulfate (NURTEC) 75 MG TBDP Take 75 mg by mouth daily as needed (Migraine).     [provider]  rosuvastatin (CRESTOR) 10 MG tablet Take 10 mg by mouth daily. Mid morning 02/28/18   [provider]  sacubitril-valsartan (ENTRESTO)  24-26 MG Take 1 tablet by mouth 2 (two) times daily. 09/22/22   PatelLavina Hamman silver sulfADIAZINE (SILVADENE) 1 % cream Apply to affected area daily 09/17/22 09/17/23  PatelLavina Hamman sitaGLIPtin (JANUVIA) 100 MG tablet Take 1 tablet (100 mg total) by  mouth daily. 10/19/21   Nicholas Lose, MD  sucralfate (CARAFATE) 1 GM/10ML suspension Take 1 g by mouth daily as needed (as directed for ulcers).    [provider]  SYNTHROID 100 MCG tablet Take 1 tablet (100 mcg total) by mouth every morning. Patient taking differently: Take 100 mcg by mouth every morning. * Must be name brand* 10/19/21   Nicholas Lose, MD  Tiotropium Bromide Monohydrate (SPIRIVA RESPIMAT) 1.25 MCG/ACT AERS Inhale 2 puffs into the lungs daily. 08/15/22   Margaretha Seeds, MD  topiramate (TOPAMAX) 50 MG tablet Take 150 mg by mouth at bedtime.  12/25/19   [provider]  zolpidem (AMBIEN CR) 12.5 MG CR tablet Take 12.5 mg by mouth at bedtime. 03/05/22   [provider]     Family History  Family history unknown: Yes    Social History   Socioeconomic History   Marital status: Married    Spouse name: Not on file   Number of children: Not on file   Years of education: Not on file   Highest education level: Not on file  Occupational History   Not on file  Tobacco Use   Smoking status: Never   Smokeless tobacco: Never  Vaping Use   Vaping Use: Never used  Substance and Sexual Activity   Alcohol use: Not Currently    Comment: social    Drug use: No   Sexual activity: Yes    Birth control/protection: Surgical  Other Topics Concern   Not on file  Social History Narrative   Not on file   Social Determinants of Health   Financial Resource Strain: Not on file  Food Insecurity: No Food Insecurity (09/13/2022)   Hunger Vital Sign    Worried About Running Out of Food in the Last Year: Never true    Ran Out of Food in the Last Year: Never true  Transportation Needs: No Transportation  Needs (09/13/2022)   PRAPARE - Hydrologist (Medical): No    Lack of Transportation (Non-Medical): No  Physical Activity: Not on file  Stress: Not on file  Social Connections: Not on file    Review of Systems: A 12 point ROS discussed and pertinent positives are indicated in the HPI above.  All other systems are negative.  Review of Systems  Vital Signs: BP 108/70   Pulse 92   Temp 98.9 F (37.2 C) (Oral)   Resp 18   LMP  (LMP Unknown)   SpO2 100%   Physical Exam HENT:     Mouth/Throat:     Mouth: Mucous membranes are moist.  Cardiovascular:     Rate and Rhythm: Normal rate and regular rhythm.     Heart sounds: Normal heart sounds.  Pulmonary:     Effort: Pulmonary effort is normal. No respiratory distress.     Breath sounds: Normal breath sounds.  Neurological:     General: No focal deficit present.     Mental Status: She is oriented to person, place, and time.     Imaging: DG CHEST PORT 1 VIEW  Result Date: 09/16/2022 CLINICAL DATA:  Shortness of breath EXAM: PORTABLE CHEST 1 VIEW COMPARISON:  09/13/2022 FINDINGS: The heart size and mediastinal contours are within normal limits. Prominent interstitial markings bilaterally, which may be accentuated by low lung volumes. No pleural effusion or pneumothorax. The visualized skeletal structures are unremarkable. IMPRESSION: Prominent interstitial markings bilaterally, which may be accentuated by low lung volumes. Mild edema not excluded.  Electronically Signed   By: Davina Poke D.O.   On: 09/16/2022 10:24   DG Chest 1 View  Result Date: 09/13/2022 CLINICAL DATA:  Hand redness and swelling EXAM: CHEST  1 VIEW COMPARISON:  07/17/2022 FINDINGS: Low volume chest with interstitial crowding. Normal heart size. No effusion or pneumothorax. [place] bilateral breast/axilla surgery. IMPRESSION: Low volume chest without acute or focal finding. Electronically Signed   By: Jorje Guild M.D.   On:  09/13/2022 05:00   DG Hand Complete Left  Result Date: 09/13/2022 CLINICAL DATA:  Redness and swelling.  Blisters and pain. EXAM: LEFT HAND - COMPLETE 3+ VIEW COMPARISON:  None Available. FINDINGS: Proximal interphalangeal index finger amputation. No fracture, erosion, or aggressive bone lesion. Soft tissue swelling especially at the dorsal hand and wrist. No opaque foreign body. IMPRESSION: Soft tissue swelling without gas, opaque foreign body, or osseous abnormality. Electronically Signed   By: Jorje Guild M.D.   On: 09/13/2022 04:55    Labs:  CBC: Recent Labs    07/03/22 0529 07/06/22 0820 08/20/22 1513 08/31/22 0839 08/31/22 0840 08/31/22 0847 09/13/22 0427  WBC 17.8* 10.7* 8.7  --   --   --  10.0  HGB 8.7* 8.0* 10.6* 9.9* 10.2* 9.2* 9.9*  HCT 29.4* 26.6* 34.4 29.0* 30.0* 27.0* 33.5*  PLT 328 310 414  --   --   --  387    COAGS: Recent Labs    04/05/22 1854 04/06/22 0546  INR 1.1 1.2    BMP: Recent Labs    07/03/22 0529 07/06/22 0820 07/08/22 0308 08/20/22 1513 08/31/22 0839 08/31/22 0840 08/31/22 0847 09/13/22 0427  NA 139 139 136 140 140 139 143 139  K 4.2 2.8* 4.4 4.2 3.4* 3.5 3.0* 3.5  CL 106 103 102 100  --   --   --  104  CO2 _0 --   --   --  25  GLUCOSE 160* 89 122* 115*  --   --   --  83  BUN 30* 25* 27* 28*  --   --   --  26*  CALCIUM 9.3 8.4* 9.4 9.7  --   --   --  8.9  CREATININE 1.10* 1.13* 1.20* 1.30*  --   --   --  1.41*  GFRNONAA >60 59* 55*  --   --   --   --  45*    LIVER FUNCTION TESTS: Recent Labs    03/29/22 0608 04/05/22 1854 07/01/22 1336 09/13/22 0427  BILITOT 0.3 0.6 0.7 0.5  AST 21 29 32 25  ALT 32 43 29 25  ALKPHOS 83 86 70 64  PROT 7.1 8.3* 7.6 7.6  ALBUMIN 3.4* 4.0 4.0 4.3     Assessment and Plan: Poor venous access  After long discussion with pt, feel best option is for tunneled CVC instead of port. There would be no need for repeated skin trauma when accessing and if the pt does have issues with  the line or infection/bacteremia, tunneled is much is to remove/exchange than another port. Dr. Earleen Newport has discussed with Dr. Chalmers Cater as well as the pt and all agreeable to this approach. Will give Solu-Cortef 100 mg during proecdure.   Electronically Signed: Ascencion Dike, PA-C 10/09/2022, 12:46 PM   I spent a total of 20 minutes in face to face in clinical consultation, greater than 50% of which was counseling/coordinating care for tunneled CVC placement

## 2022-10-11 ENCOUNTER — Other Ambulatory Visit (HOSPITAL_COMMUNITY): Payer: Self-pay

## 2022-10-11 ENCOUNTER — Telehealth: Payer: Self-pay

## 2022-10-11 MED ORDER — NORMAL SALINE FLUSH 0.9 % IV SOLN
INTRAVENOUS | 3 refills | Status: DC
  Filled 2022-10-11: qty 600, 30d supply, fill #0
  Filled 2022-10-24 – 2022-11-06 (×4): qty 600, 30d supply, fill #1
  Filled 2022-11-22 – 2022-12-03 (×3): qty 600, 30d supply, fill #2
  Filled 2022-12-24: qty 600, 30d supply, fill #3

## 2022-10-11 NOTE — Telephone Encounter (Signed)
1130 am.  Phone call made to patient to complete a telephonic check, update patient on PC changes and schedule a home visit.  No answer.  Message left requesting a call back.

## 2022-10-15 ENCOUNTER — Encounter (HOSPITAL_BASED_OUTPATIENT_CLINIC_OR_DEPARTMENT_OTHER): Payer: Self-pay | Admitting: Pulmonary Disease

## 2022-10-15 ENCOUNTER — Ambulatory Visit (INDEPENDENT_AMBULATORY_CARE_PROVIDER_SITE_OTHER): Payer: BC Managed Care – PPO | Admitting: Pulmonary Disease

## 2022-10-15 VITALS — BP 112/60 | HR 98 | Ht 65.0 in | Wt 184.2 lb

## 2022-10-15 DIAGNOSIS — J9611 Chronic respiratory failure with hypoxia: Secondary | ICD-10-CM

## 2022-10-15 DIAGNOSIS — J4540 Moderate persistent asthma, uncomplicated: Secondary | ICD-10-CM

## 2022-10-15 NOTE — Patient Instructions (Addendum)
Moderate persistent eosinophilic asthma, persistent symptoms CONTINUE Advair 230-21 mcg TWO puff TWICE a day CONTINUE spiriva 1.25 mcg TWO puffs a day. This will replace Incruse CONTINUE Albuterol AS NEEDED for shortness of breath or wheezing Continue chest vest up to three times a day  Chronic hypoxemic respiratory failure Currently wearing 4L at home CONTINUE supplemental oxygen for goal greater than >88% ORDER humidifier Try to give yourself a break on lower oxygen levels as long as it is above 88%.  Right heart failure Anasarca Diuresis per Cardiology/Heart failure team  Follow-up with me in 6 months

## 2022-10-15 NOTE — Progress Notes (Signed)
Subjective:   PATIENT ID: [name] GENDER: female DOB: 08-30-1971, MRN: [id number]   HPI  Chief Complaint  Patient presents with   Follow-up    sob   Reason for Visit: Follow-up  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  Synopsis: Previously followed at [facility] for cough variant asthma, subcentimeter lung nodules (stable) and chronic hypoxemia of unknown etiology. Patient had previously had concerns for pulmonary fibrosis however after no evidence of parenchymal disease on chest imaging. 2020 - Initially consulted with [facility] for hemoptysis. Work-up including bronchoscopy and CTA were negative and she has not had significant recurrence since then except mild episodes during exacerbations. Started on Nucala in 09/2019 for asthma however lost to follow-up from December 2020 to April 2021 2021 - Multiple hospitalizations including for kidney stone pain, migraine, bacteremia 2/2 port infection, LUE cellulitis 2022 - Re-established care and scheduled to start Nucala however unable to start. Had COVID and received antibody infusion 2023 - Had 3 hospital admissions including line associated bacteremia requiring port removal, readmission with sepsis, AKI and recently pneumonia in October  10/15/22 Since our last visit she was hospitalized for cellulitis of the left hand in December. Required stress dose steroids in setting of adrenal insufficiency. Had tunneled placed for  comfort and is seeing palliative care. She is compliant with her Advair and Spiriva, tolerating new inhaler well. Compliant with home O2. Chest vest was received and she reports using it three times a day with improved sputum production and breathing after use.  Prior Inhalers: Breo Incruse (cost too high) Advair - current  Social History: Has two adult daughters 61 for her teenage son with atypical cerebral palsy  Environmental exposures:  Hx chemotherapy  CareEverywhere Records Reviewed and Summarized: Pulm at [facility]      09/18/17 Bronchoscopy with BAL. Negative cultures and negative cytology.      Dr. Geraldo Docker documented on 01/31/18 that patient's chest imaging was presented in 10/2017 and her bronchial fibroelastotic scar was felt to be a nonspecific injury related to reflux or prior XRT and not related to her chronic cough. 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  recurrence. Currently has monthly port flushes.      The note also comments on PCP work-up comments on patient reporting hematuria with UA showing 1+ blood however this is not seen on recent PCP note.  IM at [facility] -       Per  PCP note on 03/17/19, patient previously seen by Cardiology for CHF with right heart failure and pH with EF 35% however EF normalized on echo 11/2018  Past Medical History:  Diagnosis Date   Addison's disease [facility])    Adrenal insufficiency ([place])    Anemia    Anxiety    Aortic stenosis    Aortic stenosis    Appendicitis 12/19/09   Appendicitis    Breast cancer (HCC)    STATUS POST BILATERAL MASTECTOMY. STATUS POST RECONSTRUCTION. SHE  HAD SILICONE BREAST IMPLANTS AND THE LEFT IMPLANT IS LEAKING SLIGHTLY   Cellulitis of right middle finger 11/07/2018   Cervical cancer ([place]) 12/23/2018   Chest pain    CHF with right heart failure ([place]) 04/17/2017   Chronic respiratory failure with hypoxia (HCC) 12/23/2018   Cough  variant asthma 04/13/2019   Depression    GERD (gastroesophageal reflux disease)    takes Dexilant and carafate and gi coctail    Headache    migraines on a daily and monthly regimen    Heart murmur    History of kidney stones    Hodgkin lymphoma ([place])    STATUS POST MANTLE RADIATION   Hodgkin's lymphoma ([place])    1987   Hypertension    Hypoxia    Necrotizing fasciitis ([place]) 12/23/2018   Non-ischemic cardiomyopathy ([place])    Osteoporosis    Palpitations    Pituitary adenoma ([place]) 12/23/2018   Pneumonia    PONV (postoperative nausea and vomiting)    Pre-diabetes    per pt; no meds   Pulmonary hypertension ([place]) 12/23/2018   Raynaud phenomenon    Right heart failure ([place]) 04/17/2017   Seizures ([place])    last febrile seizure was approx 3 weeks ago per report on 12/01/2020   Sleep apnea    upcoming sleep study per pt    Supplemental oxygen dependent    3 liters   SVT (supraventricular tachycardia)    Tachycardia    Thyroid cancer ([place])    STATUS POST SURGICAL REMOVAL-CURRENT ON THYROID REPLACEMENT     Allergies  Allergen Reactions   Ferrous Bisglycinate Chelate [Iron] Anaphylaxis and Other (See Comments)    Only IV - only FERRLICET   Mushroom Extract Complex Anaphylaxis   Na Ferric Gluc Cplx In Sucrose Anaphylaxis   Cymbalta [Duloxetine Hcl] Swelling and Anxiety   Hydromorphone Other (See Comments)    BP drop and heart rate drops 5.1.20 PT REPORTS THAT SHE TAKES DILAUDID AT HOME   Ondansetron Hcl Other (See Comments)   Promethazine Other (See Comments)    Other reaction(s): Unknown   Succinylcholine Other (See Comments)    Lock Jaw   Buprenorphine Hcl Hives   Compazine Other (See Comments)    Altered mental status Aggression   Duloxetine     Other reaction(s): Not available   Metoclopramide Other (See Comments)    Dystonia   Morphine And Related Hives   Ondansetron Hives and Rash    Other reaction(s): Unknown Other reaction(s): Unknown   Promethazine Hcl Hives    Tegaderm Ag Mesh [Silver] Rash    Old formulation only, is able to tolerate new formulation     Outpatient Medications Prior to Visit  Medication Sig Dispense Refill   aspirin EC 81 MG tablet Take 1 tablet (81 mg total) by mouth daily. Swallow whole. (Patient taking differently: Take 81 mg by mouth at bedtime. Chewable) 90 tablet 3   benzonatate (TESSALON) 200 MG capsule Take 200 mg by mouth 3 (three) times daily as needed for cough.     Bismuth Tribromoph-Petrolatum (XEROFORM OCCLUSIVE GAUZE STRIP) PADS Apply 1 each topically as directed. 50 each 1   bisoprolol (ZEBETA) 5 MG tablet Take 0.5 tablets (2.5 mg total) by mouth daily. Take 0.5 tablet by mouth daily     bumetanide (BUMEX) 1 MG tablet TAKE 1 TABLET BY MOUTH TWICE A DAY 180 tablet 0   buPROPion (WELLBUTRIN XL) 150 MG 24 hr tablet TAKE 1 TABLET BY MOUTH EVERY DAY 90 tablet  0   clonazePAM (KLONOPIN) 0.5 MG tablet Take 1 tablet (0.5 mg total) by mouth 3 (three) times daily as needed for anxiety. 90 tablet 2   dexlansoprazole (DEXILANT) 60 MG capsule Take 60 mg by mouth daily.     Diclofenac Potassium,Migraine, (CAMBIA) 50 MG PACK Take 50 mg by mouth daily as needed for migraine.     DIPHENHYDRAMINE HCL IJ Inject 0.5 mLs into the muscle daily.     dronabinol (MARINOL) 2.5 MG capsule TAKE 1 CAPSULE BY MOUTH TWICE A DAY AS NEEDED NAUSEA (Patient taking differently: Take 2.5 mg by mouth at bedtime.) 60 capsule 0   EMGALITY 120 MG/ML SOSY Inject 120 mg into the skin every 28 (twenty-eight) days.     EPINEPHrine 0.3 mg/0.3 mL IJ SOAJ injection Inject 0.3 mg into the muscle as needed for anaphylaxis. 2 each 2   escitalopram (LEXAPRO) 20 MG tablet Take 1 tablet (20 mg total) by mouth every evening. 90 tablet 3   FLORASTOR 250 MG capsule Take 250 mg by mouth See admin instructions. Take 250 mg by mouth mid-morning and mid-afternoon (1600)     fluticasone-salmeterol (ADVAIR HFA) 230-21 MCG/ACT inhaler INHALE 2 PUFFS INTO THE LUNGS TWICE A DAY 12  each 5   guaiFENesin (ROBITUSSIN) 100 MG/5ML liquid Take 15 mLs by mouth every 6 (six) hours as needed for cough or to loosen phlegm. 120 mL 0   hydrocortisone (CORTEF) 10 MG tablet Take 1-2 tablets (10-20 mg total) by mouth See admin instructions. Take 20 mg in the am and 53m in the evening. Take after completing prednisone     lamoTRIgine (LAMICTAL) 150 MG tablet Take one tablet at bedtime. (Patient taking differently: Take 150 mg by mouth at bedtime. Take one tablet at bedtime.) 90 tablet 1   Lancets (ONETOUCH DELICA PLUS [id number] MISC 3 (three) times daily. for testing     nitroGLYCERIN (NITROSTAT) 0.4 MG SL tablet Dissolve 1 tablet under the tongue every 5 minutes as needed for chest pain. Max of 3 doses, then 911. 25 tablet 6   ONETOUCH VERIO test strip 3 (three) times daily. for testing     oxyCODONE-acetaminophen (PERCOCET/ROXICET) 5-325 MG tablet Take 1 tablet by mouth every 8 (eight) hours as needed for moderate pain or severe pain. 15 tablet 0   OXYGEN Inhale 4-5 L/min into the lungs continuous.     predniSONE (DELTASONE) 10 MG tablet Take 496mdaily for 3days,Take 3052maily for 3days,Take 53m81mily for 3days,Take 10mg60mly for 3days, then stop and switch to cortef 30 tablet 0   PROAIR HFA 108 (90 Base) MCG/ACT inhaler Inhale 2 puffs into the lungs every 4 (four) hours as needed for wheezing or shortness of breath.     Rimegepant Sulfate (NURTEC) 75 MG TBDP Take 75 mg by mouth daily as needed (Migraine).      rosuvastatin (CRESTOR) 10 MG tablet Take 10 mg by mouth daily. Mid morning     sacubitril-valsartan (ENTRESTO) 24-26 MG Take 1 tablet by mouth 2 (two) times daily. 60 tablet 11   silver sulfADIAZINE (SILVADENE) 1 % cream Apply to affected area daily 50 g 1   sitaGLIPtin (JANUVIA) 100 MG tablet Take 1 tablet (100 mg total) by mouth daily.     Sodium Chloride Flush (NORMAL SALINE FLUSH) 0.9 % SOLN Use as directed for central venous catheter maintenance 600 mL 3   sucralfate  (CARAFATE) 1 GM/10ML suspension Take 1 g by mouth daily as needed (as directed for ulcers).  SYNTHROID 100 MCG tablet Take 1 tablet (100 mcg total) by mouth every morning. (Patient taking differently: Take 100 mcg by mouth every morning. * Must be name brand*)     Tiotropium Bromide Monohydrate (SPIRIVA RESPIMAT) 1.25 MCG/ACT AERS Inhale 2 puffs into the lungs daily. 4 g 5   topiramate (TOPAMAX) 50 MG tablet Take 150 mg by mouth at bedtime.      zolpidem (AMBIEN CR) 12.5 MG CR tablet Take 12.5 mg by mouth at bedtime.     albuterol (PROVENTIL) (2.5 MG/3ML) 0.083% nebulizer solution Take 3 mLs (2.5 mg total) by nebulization every 6 (six) hours as needed for wheezing or shortness of breath. 360 mL 2   diphenhydrAMINE (BENADRYL) 12.5 MG/5ML elixir Take 10 mLs (25 mg total) by mouth every 6 (six) hours as needed (nausea). 120 mL 0   menthol-cetylpyridinium (CEPACOL) 3 MG lozenge Take 1 lozenge (3 mg total) by mouth as needed for sore throat. 100 tablet 0   potassium chloride SA (KLOR-CON M) 20 MEQ tablet Take 1 tablet (20 mEq total) by mouth daily for 3 days. 3 tablet 0   No facility-administered medications prior to visit.    Review of Systems  Constitutional:  Negative for chills, diaphoresis, fever, malaise/fatigue and weight loss.  HENT:  Negative for congestion.   Respiratory:  Positive for cough, sputum production and shortness of breath. Negative for hemoptysis and wheezing.   Cardiovascular:  Negative for chest pain, palpitations and leg swelling.    Objective:   Vitals:   10/15/22 1045  BP: 112/60  Pulse: 98  SpO2: 100%  Weight: 184 lb 3.2 oz (83.6 kg)  Height: _0  (1.651 m)   SpO2: 100 % (5L) O2 Device: Nasal cannula O2 Flow Rate (L/min): 5 L/min O2 Type: Continuous O2  Physical Exam: General: Chronically ill-appearing, no acute distress HENT: [place], AT, OP clear, MMM Eyes: EOMI, no scleral icterus Respiratory: Clear to auscultation bilaterally.  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 No infiltrate, edema or effusion. CT 05/05/09 - Normal parenchymal. Scattered tiny pulmonary nodules with largest 18m in LUL likely benign CT Chest 09/05/17 (report only): Stable bilateral pulmonary nodules up to 436m(LUL 86m58mLUL 2 mm, RLL subpleural 4mm59mredemonstration of tiny right diaphragmatic hernia containing fat CT Chest 11/10/18 - RML 5 mm lung nodule, unchanged CT Chest HR 04/04/20 - Mild air trapping. Air trapping on expiration. No ILD. CT Chest 08/13/22 - Air trapping. 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 Subcentimeter nodules <5 mm.  PFT: 04/16/19 FVC 2.42 (65%) FEV1 2.03 (68%) Ratio 76  TLC 84% DLCO 75% Interpretation: Moderate reversible obstructive defect. Significant BD response in FEV1.  CPET Per [facility] records  CPET 03/2016 showed functional impairment with VO2max52m% predicted. Normal oxygenation throughout on room air with post-exercise PaO2=125. HR 82% predicted. Abnormal FEV1 but ventilatory reserve remained. Conclusion: Cardiovascular limitation to exercise possibly reflecting deconditioning. Ventilatory impairment present but not exercise-limiting.  6 min walk  Per [facility] records (12/07/16): SpO2 started at 97-98% and decreased to ~94% with ambulation. There was one episode of symptomatic dizziness during which SpO2 was 88% but with questionable tracing. Recovered to high-90s with ~10-15 seconds of standing rest break.  [facility] 08/13/19  Unable to complete 6MWT. Patient had episode of  coughing while walking and dizziness resulting in witnessed fall. Staff reported patient was disoriented and confused.  POC glucose was 109. Pulse oximetry >96% and tachycardia 110. No  desaturations documented during this time.  Ambulatory O2 Titration 04/13/19 - Patient unable to complete due to dizziness with ambulation. No desaturations documented. SpO2>95% in the time titration was attempted on room air. Per nursing, patient anxious and holding breath during testing which likely contributed to early termination of test  Labs:  ANA Screen 07/03/17 - Positive  Feb 2020 ANA - neg SSA/SSB - neg Anti-DNA ab -neg MPO/PR3 ab - ng GBM ab - neg Anti-scleroderma ab - neg APLAS - neg ANCA titers - neg RF mildly elevated 18.6  Bronchoscopy: 11/19/18   11/19/18 TBFNA station 7 (mislabeled as bronchial brushing) - negative for malignant cells  11/19/18 Respiratory/fungal, AFB from BAL of L lingula - negative    Assessment & Plan:   Discussion: 52 year old female with complex medical history as noted above. Previously followed at [facility] for cough variant asthma, subcentimeter lung nodules (stable) and chronic hypoxemic of unknown etiology (normal lung parenchyma by biopsy and chest imaging).  She was initially established with [facility] for hemoptysis. Work-up including bronchoscopy and CTA were negative and she has not had significant recurrence since then except mild episodes during exacerbations. For her asthma she was started on Nucala in 09/2019. Lost to follow-up and re-established care in 2022. Attempted to restart Nucala however she had medical issues preventing restart.  Recent multiple hospitalizations including pneumonia in October 2023 and Dec 2023. Reviewed hospital course extensively. Followed by therapist and palliative therapy. Focusing on current symptoms at this time point.  Stable from respiratory standpoint and improved pulmonary toilet with chest therapy.  Moderate persistent eosinophilic asthma, persistent symptoms Chronic bronchitis, failed flutter valve and 3% saline, improved on chest vest Probable diaphragm weakness in setting of  deconditioning related to uncontrolled lung diseae - poor inspiratory and expiratory effort with persistent air trapping on imaging despite current bronchodilator therapy. Likely contributes to her inability to clear secretions well --CONTINUE Advair 230-21 mcg TWO puff TWICE a day --CONTINUE spiriva 1.25 mcg TWO puffs a day. This will replace Incruse --CONTINUE Albuterol AS NEEDED for shortness of breath or wheezing --Continue chest vest up to three times a day  Chronic hypoxemic respiratory failure Currently wearing 4L at home --CONTINUE supplemental oxygen for goal greater than >88% --Try to give yourself a break on lower oxygen levels as long as it is above 88%.  Right heart failure Anasarca --Diuresis per Cardiology/Heart failure team  DNR/DNI Discussed plan regarding event of cardiopulmonary event requiring treatment. OK for family to call EMS but they need to be able communicate her code status at time of event --CONTINUE palliative care  Health Maintenance Immunization History  Administered Date(s) Administered   Fluad Quad(high Dose 65+) 06/30/2020   Influenza, High Dose Seasonal PF 06/19/2019   Influenza, Quadrivalent, Recombinant, Inj, Pf 08/13/2018   Influenza, Seasonal, Injecte, Preservative Fre 06/23/2016, 11/12/2017, 08/13/2018   Influenza,inj,Quad PF,6+ Mos 07/04/2015, 07/04/2015, 06/23/2016, 07/03/2017, 11/12/2017, 08/13/2018   Influenza,inj,Quad PF,6-35 Mos 07/04/2015   Influenza,inj,quad, With Preservative 06/19/2019   Influenza-Unspecified 07/23/2007, 07/06/2008, 06/13/2009, 07/06/2010, 07/25/2011, 08/01/2012, 07/14/2013, 07/04/2015, 07/04/2015, 06/23/2016, 06/23/2016, 07/03/2017, 11/12/2017, 06/19/2019, 06/30/2020, 06/01/2021   Moderna Sars-Covid-2 Vaccination 12/15/2019, 01/12/2020   Pneumococcal Conjugate PCV 7 03/20/2019   Pneumococcal Conjugate-13 03/20/2019   Pneumococcal Polysaccharide-23 02/21/2017   Pneumococcal-Unspecified 02/21/2017   Td 12/19/2010    Td (Adult), 2 Lf Tetanus Toxid,  Preservative Free 12/19/2010   Varicella 04/02/2007   CT Lung Screen - not qualified. Never smoker  Orders Placed This Encounter  Procedures   Ambulatory Referral for DME    Referral Priority:   Routine    Referral Type:   Durable Medical Equipment Purchase    Number of Visits Requested:   1   No orders of the defined types were placed in this encounter.  Return in about 6 months (around 04/15/2023).  I have spent a total time of 37-minutes on the day of the appointment including chart review, data review, collecting history, coordinating care and discussing medical diagnosis and plan with the patient/family. Past medical history, allergies, medications were reviewed. Pertinent imaging, labs and tests included in this note have been reviewed and interpreted independently by me.  [place], MD [facility] 10/15/2022 2:15 PM  Office Number [phone]

## 2022-10-16 ENCOUNTER — Other Ambulatory Visit (HOSPITAL_COMMUNITY): Payer: Self-pay

## 2022-10-16 ENCOUNTER — Telehealth (INDEPENDENT_AMBULATORY_CARE_PROVIDER_SITE_OTHER): Payer: BC Managed Care – PPO | Admitting: Adult Health

## 2022-10-16 ENCOUNTER — Encounter: Payer: Self-pay | Admitting: Adult Health

## 2022-10-16 DIAGNOSIS — F410 Panic disorder [episodic paroxysmal anxiety] without agoraphobia: Secondary | ICD-10-CM | POA: Diagnosis not present

## 2022-10-16 DIAGNOSIS — F331 Major depressive disorder, recurrent, moderate: Secondary | ICD-10-CM

## 2022-10-16 DIAGNOSIS — F411 Generalized anxiety disorder: Secondary | ICD-10-CM | POA: Diagnosis not present

## 2022-10-16 DIAGNOSIS — G4700 Insomnia, unspecified: Secondary | ICD-10-CM

## 2022-10-16 NOTE — Progress Notes (Signed)
[name] [id number] 17-Jan-1971 52 y.o.  Virtual Visit via Video Note  I connected with pt @ on 10/16/22 at 10:20 AM EST by a video enabled telemedicine application and verified that I am speaking with the correct person using two identifiers.   I discussed the limitations of evaluation and management by telemedicine and the availability of in person appointments. The patient expressed understanding and agreed to proceed.  I discussed the assessment and treatment plan with the patient. The patient was provided an opportunity to ask questions and all were answered. The patient agreed with the plan and demonstrated an understanding of the instructions.   The patient was advised to call back or seek an in-person evaluation if the symptoms worsen or if the condition fails to improve as anticipated.  I provided 25 minutes of non-face-to-face time during this encounter.  The patient was located at home.  The provider was located at [place].   Aloha Gell, NP   Subjective:   Patient ID:  [name] is a 52 y.o. (DOB [birth year]) female.  Chief Complaint: No chief complaint on file.   HPI [name] presents for follow-up of MDD, GAD, insomnia, and panic attacks.  Describes mood today as "not so good". Pleasant. Tearful throughout interview. Mood symptoms - reports depression and anxiety. Denies irritability. Reports panic attacks. Reports worry, rumination, and over thinking. Stating "I'm not doing well - I want to feel happy". Reports increased frustration with ongoing health issues - has been referred to palliative care. Husband supportive. Varying interest and motivation. Taking medications as prescribed. Working with Rinaldo Cloud for therapy.  Energy levels poor. Active, does not have a regular exercise routine with current physical disabilities. Enjoys some usual interests and activities. Married. Lives with husband - 2 children. Spending time with family.   Appetite decreased. Weight gain 187 pounds. Sleeps better some nights than others. Averages  Focus and concentration difficulties - forgetfulness. Completing tasks. Managing aspects of household. Disabled in 2016. Denies SI or HI.  Denies AH or VH.  Previous medication trials: Wellbutrin - mean, Zoloft-not helpful   Review of Systems:  Review of Systems  Musculoskeletal:  Negative for gait problem.  Neurological:  Negative for tremors.  Psychiatric/Behavioral:         Please refer to HPI    Medications: I have reviewed the patient's current medications.  Current Outpatient Medications  Medication Sig Dispense Refill   albuterol (PROVENTIL) (2.5 MG/3ML) 0.083% nebulizer solution Take 3 mLs (2.5 mg total) by nebulization every 6 (six) hours as needed for wheezing or shortness of breath. 360 mL 2   aspirin EC 81 MG tablet Take 1 tablet (81 mg total) by mouth daily. Swallow whole. (Patient taking differently: Take 81 mg by mouth at bedtime. Chewable) 90 tablet 3   benzonatate (TESSALON) 200 MG capsule Take 200 mg by mouth 3 (three) times daily as needed for cough.     Bismuth Tribromoph-Petrolatum (XEROFORM OCCLUSIVE GAUZE STRIP) PADS Apply 1 each topically as directed. 50 each 1   bisoprolol (ZEBETA) 5 MG tablet Take 0.5 tablets (2.5 mg total) by mouth daily. Take 0.5 tablet by mouth daily     bumetanide (BUMEX) 1 MG tablet TAKE 1 TABLET BY MOUTH TWICE A DAY 180 tablet 0   buPROPion (WELLBUTRIN XL) 150 MG 24 hr tablet TAKE 1 TABLET BY MOUTH EVERY DAY 90 tablet 0   clonazePAM (KLONOPIN) 0.5 MG tablet Take 1 tablet (0.5 mg total) by mouth 3 (  three) times daily as needed for anxiety. 90 tablet 2   dexlansoprazole (DEXILANT) 60 MG capsule Take 60 mg by mouth daily.     Diclofenac Potassium,Migraine, (CAMBIA) 50 MG PACK Take 50 mg by mouth daily as needed for migraine.     diphenhydrAMINE (BENADRYL) 12.5 MG/5ML elixir Take 10 mLs (25 mg total) by mouth every 6 (six) hours as needed (nausea).  120 mL 0   DIPHENHYDRAMINE HCL IJ Inject 0.5 mLs into the muscle daily.     dronabinol (MARINOL) 2.5 MG capsule TAKE 1 CAPSULE BY MOUTH TWICE A DAY AS NEEDED NAUSEA (Patient taking differently: Take 2.5 mg by mouth at bedtime.) 60 capsule 0   EMGALITY 120 MG/ML SOSY Inject 120 mg into the skin every 28 (twenty-eight) days.     EPINEPHrine 0.3 mg/0.3 mL IJ SOAJ injection Inject 0.3 mg into the muscle as needed for anaphylaxis. 2 each 2   escitalopram (LEXAPRO) 20 MG tablet Take 1 tablet (20 mg total) by mouth every evening. 90 tablet 3   FLORASTOR 250 MG capsule Take 250 mg by mouth See admin instructions. Take 250 mg by mouth mid-morning and mid-afternoon (1600)     fluticasone-salmeterol (ADVAIR HFA) 230-21 MCG/ACT inhaler INHALE 2 PUFFS INTO THE LUNGS TWICE A DAY 12 each 5   guaiFENesin (ROBITUSSIN) 100 MG/5ML liquid Take 15 mLs by mouth every 6 (six) hours as needed for cough or to loosen phlegm. 120 mL 0   hydrocortisone (CORTEF) 10 MG tablet Take 1-2 tablets (10-20 mg total) by mouth See admin instructions. Take 20 mg in the am and 60m in the evening. Take after completing prednisone     lamoTRIgine (LAMICTAL) 150 MG tablet Take one tablet at bedtime. (Patient taking differently: Take 150 mg by mouth at bedtime. Take one tablet at bedtime.) 90 tablet 1   Lancets (ONETOUCH DELICA PLUS [id number] MISC 3 (three) times daily. for testing     menthol-cetylpyridinium (CEPACOL) 3 MG lozenge Take 1 lozenge (3 mg total) by mouth as needed for sore throat. 100 tablet 0   nitroGLYCERIN (NITROSTAT) 0.4 MG SL tablet Dissolve 1 tablet under the tongue every 5 minutes as needed for chest pain. Max of 3 doses, then 911. 25 tablet 6   ONETOUCH VERIO test strip 3 (three) times daily. for testing     oxyCODONE-acetaminophen (PERCOCET/ROXICET) 5-325 MG tablet Take 1 tablet by mouth every 8 (eight) hours as needed for moderate pain or severe pain. 15 tablet 0   OXYGEN Inhale 4-5 L/min into the lungs continuous.      potassium chloride SA (KLOR-CON M) 20 MEQ tablet Take 1 tablet (20 mEq total) by mouth daily for 3 days. 3 tablet 0   predniSONE (DELTASONE) 10 MG tablet Take 466mdaily for 3days,Take 3056maily for 3days,Take 94m69mily for 3days,Take 10mg14mly for 3days, then stop and switch to cortef 30 tablet 0   PROAIR HFA 108 (90 Base) MCG/ACT inhaler Inhale 2 puffs into the lungs every 4 (four) hours as needed for wheezing or shortness of breath.     Rimegepant Sulfate (NURTEC) 75 MG TBDP Take 75 mg by mouth daily as needed (Migraine).      rosuvastatin (CRESTOR) 10 MG tablet Take 10 mg by mouth daily. Mid morning     sacubitril-valsartan (ENTRESTO) 24-26 MG Take 1 tablet by mouth 2 (two) times daily. 60 tablet 11   silver sulfADIAZINE (SILVADENE) 1 % cream Apply to affected area daily 50 g 1   sitaGLIPtin (JANUVIA) 100  MG tablet Take 1 tablet (100 mg total) by mouth daily.     Sodium Chloride Flush (NORMAL SALINE FLUSH) 0.9 % SOLN Use as directed for central venous catheter maintenance 600 mL 3   sucralfate (CARAFATE) 1 GM/10ML suspension Take 1 g by mouth daily as needed (as directed for ulcers).     SYNTHROID 100 MCG tablet Take 1 tablet (100 mcg total) by mouth every morning. (Patient taking differently: Take 100 mcg by mouth every morning. * Must be name brand*)     Tiotropium Bromide Monohydrate (SPIRIVA RESPIMAT) 1.25 MCG/ACT AERS Inhale 2 puffs into the lungs daily. 4 g 5   topiramate (TOPAMAX) 50 MG tablet Take 150 mg by mouth at bedtime.      zolpidem (AMBIEN CR) 12.5 MG CR tablet Take 12.5 mg by mouth at bedtime.     No current facility-administered medications for this visit.    Medication Side Effects: None  Allergies:  Allergies  Allergen Reactions   Ferrous Bisglycinate Chelate [Iron] Anaphylaxis and Other (See Comments)    Only IV - only FERRLICET   Mushroom Extract Complex Anaphylaxis   Na Ferric Gluc Cplx In Sucrose Anaphylaxis   Cymbalta [Duloxetine Hcl] Swelling and Anxiety    Hydromorphone Other (See Comments)    BP drop and heart rate drops 5.1.20 PT REPORTS THAT SHE TAKES DILAUDID AT HOME   Ondansetron Hcl Other (See Comments)   Promethazine Other (See Comments)    Other reaction(s): Unknown   Succinylcholine Other (See Comments)    Lock Jaw   Buprenorphine Hcl Hives   Compazine Other (See Comments)    Altered mental status Aggression   Duloxetine     Other reaction(s): Not available   Metoclopramide Other (See Comments)    Dystonia   Morphine And Related Hives   Ondansetron Hives and Rash    Other reaction(s): Unknown Other reaction(s): Unknown   Promethazine Hcl Hives   Tegaderm Ag Mesh [Silver] Rash    Old formulation only, is able to tolerate new formulation    Past Medical History:  Diagnosis Date   Addison's disease ([place])    Adrenal insufficiency (HCC)    Anemia    Anxiety    Aortic stenosis    Aortic stenosis    Appendicitis 12/19/09   Appendicitis    Breast cancer (HCC)    STATUS POST BILATERAL MASTECTOMY. STATUS POST RECONSTRUCTION. SHE HAD SILICONE BREAST IMPLANTS AND THE LEFT IMPLANT IS LEAKING SLIGHTLY   Cellulitis of right middle finger 11/07/2018   Cervical cancer ([place]) 12/23/2018   Chest pain    CHF with right heart failure ([place]) 04/17/2017   Chronic respiratory failure with hypoxia (HCC) 12/23/2018   Cough variant asthma 04/13/2019   Depression    GERD (gastroesophageal reflux disease)    takes Dexilant and carafate and gi coctail    Headache    migraines on a daily and monthly regimen    Heart murmur    History of kidney stones    Hodgkin lymphoma ([place])    STATUS POST MANTLE RADIATION   Hodgkin's lymphoma ([place])    1987   Hypertension    Hypoxia    Necrotizing fasciitis ([place]) 12/23/2018   Non-ischemic cardiomyopathy ([place])    Osteoporosis    Palpitations    Pituitary adenoma ([place]) 12/23/2018   Pneumonia    PONV (postoperative nausea and vomiting)    Pre-diabetes    per pt; no meds   Pulmonary hypertension ([place])  12/23/2018   Raynaud phenomenon  Right heart failure ([place]) 04/17/2017   Seizures (HCC)    last febrile seizure was approx 3 weeks ago per report on 12/01/2020   Sleep apnea    upcoming sleep study per pt    Supplemental oxygen dependent    3 liters   SVT (supraventricular tachycardia)    Tachycardia    Thyroid cancer ([place])    STATUS POST SURGICAL REMOVAL-CURRENT ON THYROID REPLACEMENT    Family History  Family history unknown: Yes    Social History   Socioeconomic History   Marital status: Married    Spouse name: Not on file   Number of children: Not on file   Years of education: Not on file   Highest education level: Not on file  Occupational History   Not on file  Tobacco Use   Smoking status: Never   Smokeless tobacco: Never  Vaping Use   Vaping Use: Never used  Substance and Sexual Activity   Alcohol use: Not Currently    Comment: social    Drug use: No   Sexual activity: Yes    Birth control/protection: Surgical  Other Topics Concern   Not on file  Social History Narrative   Not on file   Social Determinants of Health   Financial Resource Strain: Not on file  Food Insecurity: No Food Insecurity (09/13/2022)   Hunger Vital Sign    Worried About Running Out of Food in the Last Year: Never true    Ran Out of Food in the Last Year: Never true  Transportation Needs: No Transportation Needs (09/13/2022)   PRAPARE - Hydrologist (Medical): No    Lack of Transportation (Non-Medical): No  Physical Activity: Not on file  Stress: Not on file  Social Connections: Not on file  Intimate Partner Violence: Not At Risk (09/13/2022)   Humiliation, Afraid, Rape, and Kick questionnaire    Fear of Current or Ex-Partner: No    Emotionally Abused: No    Physically Abused: No    Sexually Abused: No    Past Medical History, Surgical history, Social history, and Family history were reviewed and updated as appropriate.   Please see review of  systems for further details on the patient's review from today.   Objective:   Physical Exam:  LMP  (LMP Unknown)   Physical Exam Constitutional:      General: She is not in acute distress. Musculoskeletal:        General: No deformity.  Neurological:     Mental Status: She is alert and oriented to person, place, and time.     Coordination: Coordination normal.  Psychiatric:        Attention and Perception: Attention and perception normal. She does not perceive auditory or visual hallucinations.        Mood and Affect: Mood normal. Mood is not anxious or depressed. Affect is not labile, blunt, angry or inappropriate.        Speech: Speech normal.        Behavior: Behavior normal.        Thought Content: Thought content normal. Thought content is not paranoid or delusional. Thought content does not include homicidal or suicidal ideation. Thought content does not include homicidal or suicidal plan.        Cognition and Memory: Cognition and memory normal.        Judgment: Judgment normal.     Comments: Insight intact     Lab Review:  Component Value Date/Time   NA 139 09/13/2022 0427   NA 140 08/20/2022 1513   K 3.5 09/13/2022 0427   CL 104 09/13/2022 0427   CO2 25 09/13/2022 0427   GLUCOSE 83 09/13/2022 0427   BUN 26 (H) 09/13/2022 0427   BUN 28 (H) 08/20/2022 1513   CREATININE 1.41 (H) 09/13/2022 0427   CREATININE 1.08 (H) 02/09/2022 1202   CALCIUM 8.9 09/13/2022 0427   PROT 7.6 09/13/2022 0427   PROT 7.1 01/23/2018 1225   ALBUMIN 4.3 09/13/2022 0427   ALBUMIN 4.4 01/23/2018 1225   AST 25 09/13/2022 0427   AST 26 02/09/2022 1202   ALT 25 09/13/2022 0427   ALT 40 02/09/2022 1202   ALKPHOS 64 09/13/2022 0427   BILITOT 0.5 09/13/2022 0427   BILITOT 0.4 02/09/2022 1202   GFRNONAA 45 (L) 09/13/2022 0427   GFRNONAA >60 02/09/2022 1202   GFRAA >60 04/27/2020 0613   GFRAA >60 02/10/2020 1020       Component Value Date/Time   WBC 10.0 09/13/2022 0427   RBC 4.05  09/13/2022 0427   HGB 9.9 (L) 09/13/2022 0427   HGB 10.6 (L) 08/20/2022 1513   HCT 33.5 (L) 09/13/2022 0427   HCT 34.4 08/20/2022 1513   PLT 387 09/13/2022 0427   PLT 414 08/20/2022 1513   MCV 82.7 09/13/2022 0427   MCV 78 (L) 08/20/2022 1513   MCH 24.4 (L) 09/13/2022 0427   MCHC 29.6 (L) 09/13/2022 0427   RDW 18.1 (H) 09/13/2022 0427   RDW 16.5 (H) 08/20/2022 1513   LYMPHSABS 3.5 09/13/2022 0427   MONOABS 1.3 (H) 09/13/2022 0427   EOSABS 0.2 09/13/2022 0427   BASOSABS 0.1 09/13/2022 0427    No results found for: "POCLITH", "LITHIUM"   No results found for: "PHENYTOIN", "PHENOBARB", "VALPROATE", "CBMZ"   .res Assessment: Plan:    Plan:  PDMP reviewed  Lexapro 48m daily Clonazepam 0.515mTID Wellbutrin XL 15071mvery morning - denies seizure history Lamictal 150 mg daily  Plans to increase therapy visits.  Discussed potential benefits, risk, and side effects of benzodiazepines to include potential risk of tolerance and dependence, as well as possible drowsiness.  Advised patient not to drive if experiencing drowsiness and to take lowest possible effective dose to minimize risk of dependence and tolerance.   Counseled patient regarding potential benefits, risks, and side effects of Lamictal to include potential risk of Stevens-Johnson syndrome. Advised patient to stop taking Lamictal and contact office immediately if rash develops and to seek urgent medical attention if rash is severe and/or spreading quickly. Set up with therapist  RTC 2 months  Patient advised to contact office with any questions, adverse effects, or acute worsening in signs and symptoms.  Diagnoses and all orders for this visit:  Major depressive disorder, recurrent episode, moderate (HCC)  Generalized anxiety disorder  Panic attacks  Insomnia, unspecified type     Please see After Visit Summary for patient specific instructions.  Future Appointments  Date Time Provider DepFreeborn1/17/2024 12:00 PM DowShanon AceCSW CP-CP None  11/07/2022  1:00 PM DowShanon AceCSW CP-CP None    No orders of the defined types were placed in this encounter.     -------------------------------

## 2022-10-17 ENCOUNTER — Ambulatory Visit: Payer: Medicare Other | Admitting: Psychiatry

## 2022-10-18 ENCOUNTER — Ambulatory Visit: Payer: BC Managed Care – PPO | Admitting: Psychiatry

## 2022-10-18 DIAGNOSIS — F331 Major depressive disorder, recurrent, moderate: Secondary | ICD-10-CM | POA: Diagnosis not present

## 2022-10-18 NOTE — Progress Notes (Addendum)
[facility] Counselor/Therapist Progress Note  Patient ID: [name], MRN: [id number],    Date: 10/18/2022  Time Spent: 45 minutes   Virtual Visit via Telehealth Note: MyChart Video session Connected with patient by a telemedicine/telehealth application, with their informed consent, and verified patient privacy and that I am speaking with the correct person using two identifiers. I discussed the limitations, risks, security and privacy concerns of performing psychotherapy and the availability of in person appointments. I also discussed with the patient that there may be a patient responsible charge related to this service. The patient expressed understanding and agreed to proceed. I discussed the treatment planning with the patient. The patient was provided an opportunity to ask questions and all were answered. The patient agreed with the plan and demonstrated an understanding of the instructions. The patient was advised to call  our office if  symptoms worsen or feel they are in a crisis state and need immediate contact.   Therapist Location: office Patient Location: home   Treatment Type: Individual Therapy  Reported Symptoms: anxiety, depression, anger, sad, often feeling "it's not fair all the health battles I've been through in my life"  Mental Status Exam:  Appearance:   Casual     Behavior:  Appropriate, Sharing, and less motivation  Motor:  Affected by her physical issues  especially pulmonary and cardiac and renal issues  Speech/Language:   Clear and Coherent  Affect:  Depressed and anxious  Mood:  anxious and depressed  Thought process:  tangential  Thought content:    Tangential  Sensory/Perceptual disturbances:    WNL  Orientation:  oriented to person, place, time/date, situation, day of week, month of year, year, and stated date of Jan. 18,. 2024  Attention:  Good  Concentration:  Fair  Memory:  Reports some memory issues and are some worse   Fund of  knowledge:   Fair  Insight:    Good and Fair  Judgment:   Good  Impulse Control:  Good   Risk Assessment: Danger to Self:  No Self-injurious Behavior: No Danger to Others: No Duty to Warn:no Physical Aggression / Violence:No  Access to Firearms a concern: No  Gang Involvement:No   Subjective:  Patient today reporting anxiety as main symptom and depression also due to her continued struggle with her pulmonary and cardiac symptoms and just feeling very drained by it all.  Needed session today to process her mental/emotional pain, fears, anger, anxious and depressive thoughts, and her feelings about her current. Working on anger, sadness, and "feelings of it's not fair."  Some specific hurt by 2 family members shared and processed more at length today. Discussed her feeling useless at times. Seems to really appreciate the opportunity to vent her concerns, fears, and anxieties. Mostly wanting to speak and feel heard and states she has felt this in our talking today.   Interventions: Cognitive Behavioral Therapy, Solution-Oriented/Positive Psychology, and Ego-Supportive  Long-term goal:  Patient will confront her anxiety, fears, anger reported re: her illness and use time in therapy and beyond to work on this, while receiving feedback and support from therapist. Help her determine what she feels is realistic for her considering her health challenges and difficult prognosis.  Short-term goal: Participate in therapy and initiate communication of needs and desires at this point in her struggle physically.  Strategies: Share and talk through anxiety, anger, and depression that are experienced and work to find and use helpful coping tools to better manage those  feelings.   Diagnosis:   ICD-10-CM   1. Major depressive disorder, recurrent episode, moderate ([place])  F33.1      Plan: Patient today worked well in therapy despite the fact she has not been doing very well with her physical symptoms  recently.  States that she appreciates again the time to talk with someone without judgment and feeling heard.  As noted above she continue processing her anxious/depressive thoughts and feelings as well as some anger.  Focused on the anger initially which she really needed and seem to benefit her by session and.  Brother remains her closest support within the family.  Acknowledges the unpredictability of her health condition and her feelings about being in palliative care.  Grateful for the help she is getting.  Having to deal with some comments within family/extended family that are hurtful and was able to get support with this today.  Denies any SI.  Needs to continue with goal-directed behaviors in order to cope the best she can with her situation and feels supported in the midst of so much uncertainty with her declining health. Encouraged patient in her goal-directed behaviors and physical challenges, especially in her own self-care, freely asking for help when she needs it.  Reviewed information she shared from medical staff and they are treating symptoms as they know they cannot cure her illness and that she is a "DNR" patient with which she agreed when that decision was made.  My contact with patient now is primarily supportive in nature and helping her deal with her emotional symptoms as she has stated that she is terminally ill and "wants to work on her current issues and received support while she still can".  Goal review and progress/challenges noted with patient.  Next appointment within 3 to 4 weeks.  This record has been created using [employer].  Chart creation errors have been sought, but may not always have been located and corrected.  Such creation errors do not reflect on the standard of medical care provided.   Shanon Ace, LCSW

## 2022-10-18 NOTE — Addendum Note (Signed)
Addended byShanon Ace on: 10/18/2022 12:02 PM   Modules accepted: Level of Service

## 2022-10-19 ENCOUNTER — Telehealth: Payer: Self-pay

## 2022-10-19 NOTE — Telephone Encounter (Signed)
(  10:05 am)  PC SW scheduled an initial visit with patient. Patient is scheduled with the nurse/SW team on 10/23/22 @ 9 am.

## 2022-10-22 ENCOUNTER — Other Ambulatory Visit: Payer: Self-pay | Admitting: *Deleted

## 2022-10-22 DIAGNOSIS — J9611 Chronic respiratory failure with hypoxia: Secondary | ICD-10-CM

## 2022-10-23 ENCOUNTER — Ambulatory Visit: Payer: BC Managed Care – PPO | Admitting: Cardiovascular Disease

## 2022-10-23 ENCOUNTER — Telehealth: Payer: Self-pay | Admitting: Pulmonary Disease

## 2022-10-23 ENCOUNTER — Other Ambulatory Visit: Payer: Medicare Other

## 2022-10-23 VITALS — BP 108/70 | HR 89 | Resp 18

## 2022-10-23 DIAGNOSIS — Z515 Encounter for palliative care: Secondary | ICD-10-CM

## 2022-10-23 DIAGNOSIS — Z7189 Other specified counseling: Secondary | ICD-10-CM

## 2022-10-23 NOTE — Progress Notes (Signed)
[facility] Follow-Up Visit  PATIENT NAME: [name] DOB: 1971/01/21 MRN: [id number]  PRIMARY CARE PROVIDER: Su Grand, MD  RESPONSIBLE PARTY:  Acct ID - Guarantor Home Phone Work Phone Relationship Acct Type  [id number] Elias Else* [phone]  Self P/F     [place], [address], [place] [address]   RN & M.Lonnon,SW completed home visit. Husband also present    HISTORY OF PRESENT ILLNESS:  52 y.o. 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 1 son 21 who lives in home and  2 older children who are out of the house. Estranged from her daughter and granddaughter.   Cognitive: alert and oriented x 3. Pt tearful at times during visit. Asks appropriate questions and is engaged in conversation. Pt is seen at [facility] and has struggles with her anxiety and depression.   Appetite: Pt reports that she has an appetite but "has open ulcers in her esophagus which makes it extremely painful to eat food." Reports that she has to eat an icy pop before food to numb throat and has used magic mouthwash, carafate, chloraseptic, lidocaine spray, etc.. without success.   Mobility: Pt noted to ambulate slowly but steadily without aid of cane or walker.   Sleeping Pattern: Pt reports difficulty sleeping  and because of her medical history, takes numerous meds to help with that. States, "I get my days and nights mixed up often."  Pain: States that she "is in constant pain. There is not a joint or muscle on my person that does not always hurt." When asked about pain medication, pt states, "I do not take narcotics. I don't like feeling like I have no control. This is the only thing I have left to have control over." Spouse reports that she has tramadol but pt says it doesn't help pain at all.  RN/SW talked to patient and spouse at length about how different medications affect each individual differently and that reduction in pain/management of pain would help with quality of life.  Palliative Care/ Hospice: RN explained role and purpose of palliative care including visit frequency. Also discussed benefits of hospice care as well as the differences between the two with patient.  Pt and spouse agreeable to hospice eval. In need of extra level of support and symptom management available thru hospice. RN will get order for referral.   Goals of Care: Pt would like to have a reduction in pain so she can go out and socialize some. Pt gets lonely at home alone with husband at work and son at school. Would like son to be seen at [facility] again.    CODE STATUS: DNR  ADVANCED DIRECTIVES: N MOST FORM: No PPS:        PHYSICAL EXAM:   VITALS: Today's Vitals   10/23/22 0933  BP: 108/70  Pulse: 89  Resp: 18  SpO2: 98%  PainSc: 9   PainLoc: Generalized    LUNGS: Lungs clear to auscultation. CARDIAC:  S1 normal and S2 normal. Murmur heard. EXTREMITIES: 1+ edema noted in hands and fingers. 1+ edema also noted to bilat ankles/feet. SKIN: two open sores noted to top of right hand nickel size. Treating with Silvadene and covered with bandage, from two hospitalizations ago.    Jacqulyn Cane, RN

## 2022-10-23 NOTE — Telephone Encounter (Signed)
Dr. Loanne Drilling, please advise on this for pt and hospice.

## 2022-10-23 NOTE — Telephone Encounter (Signed)
[facility] calling again a/b a referral on this pt, Bethena Roys is the person calling she can be reached @ [phone].Hillery Hunter

## 2022-10-23 NOTE — Progress Notes (Signed)
[facility] SW NOTE  PATIENT NAME: [name] DOB: 04/02/1971 MRN: [id number]  PRIMARY CARE PROVIDER: Su Grand, MD  RESPONSIBLE PARTY:  Acct ID - Guarantor Home Phone Work Phone Relationship Acct Type  [id number] Susan [phone]  Self P/F     [place], [address], [place] [address]   Clinical Social Work Palliative Care Visit  SW and RN-J. Livengood completed an initial palliative care visit with patient at her home. She was present with her husband. The team provided education regarding palliative care services, role in patient's care, visit frequency and contact information.  Patient married for 20 years, have three children, with a 76 year old still in the home. Patient has one granddaughter. Patient previously worked for [employer] in [employer] briefly. Patient is currently on medical disability since 2016.  Patient was active with physical activity, and working for the [employer]. Patient is adopted and has been in cancer remission 7 times starting at age 78 with a diagnosis of Hodgins Disease. Patient has had several bouts with cancer (ovarian, breast, brain) history of Addison Disease. She has also experienced cardiac issues. Patient is on o2 via nasal cannula. Patient has progressively gotten weaker. Patient has blisters on her left hand. Patient has had 5 hospital admissions since July due to the swelling and infections to her hand. Patient uses a chest vest. She has a productive cough with the chest vest. Patient has developed aortic stenosis. Patient suffers from depression. She tearfully shared that she is estranged from daughter and is not allowed to see her grandchild. Patient report that she is in constant physical and spiritual pain. She will take Tramadol for pain. She takes Wellbutrin and is receiving counseling ([name]). Patient goal is to have the best quality of life, participate in family activities.  Concentrator, nebulizer,  portable concentrator, vest, wheelchair, walker Son-Mild CP, exhibit a mild spectrum, hyper-focus attitude; needs [facility].  Education provided education to patient and her husband regarding hospice, the benefits and difference between palliative care and hospice. Patient open to a hospice consult. RN will seek an order for a consult.   Social History   Tobacco Use   Smoking status: Never   Smokeless tobacco: Never  Substance Use Topics   Alcohol use: Not Currently    Comment: social     CODE STATUS: DNR ADVANCED DIRECTIVES: Yes MOST FORM COMPLETE: No HOSPICE EDUCATION PROVIDED: Yes, Consult requested  Duration of visit and documentation: 60 minutes  Staci Carver, LCSW

## 2022-10-24 ENCOUNTER — Telehealth: Payer: Self-pay

## 2022-10-24 DIAGNOSIS — E8770 Fluid overload, unspecified: Secondary | ICD-10-CM | POA: Diagnosis not present

## 2022-10-24 DIAGNOSIS — K219 Gastro-esophageal reflux disease without esophagitis: Secondary | ICD-10-CM | POA: Diagnosis not present

## 2022-10-24 DIAGNOSIS — D352 Benign neoplasm of pituitary gland: Secondary | ICD-10-CM | POA: Diagnosis not present

## 2022-10-24 DIAGNOSIS — R630 Anorexia: Secondary | ICD-10-CM | POA: Diagnosis not present

## 2022-10-24 DIAGNOSIS — Z853 Personal history of malignant neoplasm of breast: Secondary | ICD-10-CM | POA: Diagnosis not present

## 2022-10-24 DIAGNOSIS — I5030 Unspecified diastolic (congestive) heart failure: Secondary | ICD-10-CM | POA: Diagnosis not present

## 2022-10-24 DIAGNOSIS — E271 Primary adrenocortical insufficiency: Secondary | ICD-10-CM | POA: Diagnosis not present

## 2022-10-24 DIAGNOSIS — Z8543 Personal history of malignant neoplasm of ovary: Secondary | ICD-10-CM | POA: Diagnosis not present

## 2022-10-24 DIAGNOSIS — I130 Hypertensive heart and chronic kidney disease with heart failure and stage 1 through stage 4 chronic kidney disease, or unspecified chronic kidney disease: Secondary | ICD-10-CM | POA: Diagnosis not present

## 2022-10-24 DIAGNOSIS — J159 Unspecified bacterial pneumonia: Secondary | ICD-10-CM | POA: Diagnosis not present

## 2022-10-24 DIAGNOSIS — Z8571 Personal history of Hodgkin lymphoma: Secondary | ICD-10-CM | POA: Diagnosis not present

## 2022-10-24 DIAGNOSIS — N189 Chronic kidney disease, unspecified: Secondary | ICD-10-CM | POA: Diagnosis not present

## 2022-10-24 DIAGNOSIS — G8929 Other chronic pain: Secondary | ICD-10-CM | POA: Diagnosis not present

## 2022-10-24 DIAGNOSIS — K209 Esophagitis, unspecified without bleeding: Secondary | ICD-10-CM | POA: Diagnosis not present

## 2022-10-24 DIAGNOSIS — I428 Other cardiomyopathies: Secondary | ICD-10-CM | POA: Diagnosis not present

## 2022-10-24 DIAGNOSIS — Z8541 Personal history of malignant neoplasm of cervix uteri: Secondary | ICD-10-CM | POA: Diagnosis not present

## 2022-10-24 NOTE — Telephone Encounter (Signed)
Spoke with PJ at [employer] [facility] [employer]. Recommends hospice referral for management of symptoms of chronic pain related to multiple co-morbidities. Palliative team believes she would benefit and have counseled her the program regarding support and symptom management. Patient aware that going this route would mean minimizing hospital utilization and focusing on symptoms.  I communicated that I prefer hospice physician to manage symptom management as her burden is likely high  No further action needed. Encounter closed.

## 2022-10-24 NOTE — Telephone Encounter (Signed)
1511 Palliative Care Note  RN returned call to Dr. Loanne Drilling per request. Received verbal orders for hospice referral. Dr. Loanne Drilling would like to remain attending, however she would like hospice MDs to manage symptoms.  RN will send over hospice referral.  Jacqulyn Cane, RN

## 2022-10-25 ENCOUNTER — Other Ambulatory Visit (HOSPITAL_COMMUNITY): Payer: Self-pay

## 2022-10-25 DIAGNOSIS — E271 Primary adrenocortical insufficiency: Secondary | ICD-10-CM | POA: Diagnosis not present

## 2022-10-25 DIAGNOSIS — K209 Esophagitis, unspecified without bleeding: Secondary | ICD-10-CM | POA: Diagnosis not present

## 2022-10-25 DIAGNOSIS — I5030 Unspecified diastolic (congestive) heart failure: Secondary | ICD-10-CM | POA: Diagnosis not present

## 2022-10-25 DIAGNOSIS — G8929 Other chronic pain: Secondary | ICD-10-CM | POA: Diagnosis not present

## 2022-10-25 DIAGNOSIS — Z8571 Personal history of Hodgkin lymphoma: Secondary | ICD-10-CM | POA: Diagnosis not present

## 2022-10-25 DIAGNOSIS — R630 Anorexia: Secondary | ICD-10-CM | POA: Diagnosis not present

## 2022-10-25 DIAGNOSIS — D352 Benign neoplasm of pituitary gland: Secondary | ICD-10-CM | POA: Diagnosis not present

## 2022-10-25 DIAGNOSIS — N189 Chronic kidney disease, unspecified: Secondary | ICD-10-CM | POA: Diagnosis not present

## 2022-10-25 DIAGNOSIS — E8770 Fluid overload, unspecified: Secondary | ICD-10-CM | POA: Diagnosis not present

## 2022-10-25 DIAGNOSIS — J159 Unspecified bacterial pneumonia: Secondary | ICD-10-CM | POA: Diagnosis not present

## 2022-10-25 DIAGNOSIS — I428 Other cardiomyopathies: Secondary | ICD-10-CM | POA: Diagnosis not present

## 2022-10-25 DIAGNOSIS — Z853 Personal history of malignant neoplasm of breast: Secondary | ICD-10-CM | POA: Diagnosis not present

## 2022-10-25 DIAGNOSIS — Z8543 Personal history of malignant neoplasm of ovary: Secondary | ICD-10-CM | POA: Diagnosis not present

## 2022-10-25 DIAGNOSIS — I130 Hypertensive heart and chronic kidney disease with heart failure and stage 1 through stage 4 chronic kidney disease, or unspecified chronic kidney disease: Secondary | ICD-10-CM | POA: Diagnosis not present

## 2022-10-25 DIAGNOSIS — K219 Gastro-esophageal reflux disease without esophagitis: Secondary | ICD-10-CM | POA: Diagnosis not present

## 2022-10-25 DIAGNOSIS — Z8541 Personal history of malignant neoplasm of cervix uteri: Secondary | ICD-10-CM | POA: Diagnosis not present

## 2022-10-26 DIAGNOSIS — Z8541 Personal history of malignant neoplasm of cervix uteri: Secondary | ICD-10-CM | POA: Diagnosis not present

## 2022-10-26 DIAGNOSIS — G8929 Other chronic pain: Secondary | ICD-10-CM | POA: Diagnosis not present

## 2022-10-26 DIAGNOSIS — N189 Chronic kidney disease, unspecified: Secondary | ICD-10-CM | POA: Diagnosis not present

## 2022-10-26 DIAGNOSIS — K219 Gastro-esophageal reflux disease without esophagitis: Secondary | ICD-10-CM | POA: Diagnosis not present

## 2022-10-26 DIAGNOSIS — D352 Benign neoplasm of pituitary gland: Secondary | ICD-10-CM | POA: Diagnosis not present

## 2022-10-26 DIAGNOSIS — I130 Hypertensive heart and chronic kidney disease with heart failure and stage 1 through stage 4 chronic kidney disease, or unspecified chronic kidney disease: Secondary | ICD-10-CM | POA: Diagnosis not present

## 2022-10-26 DIAGNOSIS — Z853 Personal history of malignant neoplasm of breast: Secondary | ICD-10-CM | POA: Diagnosis not present

## 2022-10-26 DIAGNOSIS — K209 Esophagitis, unspecified without bleeding: Secondary | ICD-10-CM | POA: Diagnosis not present

## 2022-10-26 DIAGNOSIS — E271 Primary adrenocortical insufficiency: Secondary | ICD-10-CM | POA: Diagnosis not present

## 2022-10-26 DIAGNOSIS — Z8543 Personal history of malignant neoplasm of ovary: Secondary | ICD-10-CM | POA: Diagnosis not present

## 2022-10-26 DIAGNOSIS — I5030 Unspecified diastolic (congestive) heart failure: Secondary | ICD-10-CM | POA: Diagnosis not present

## 2022-10-26 DIAGNOSIS — J159 Unspecified bacterial pneumonia: Secondary | ICD-10-CM | POA: Diagnosis not present

## 2022-10-26 DIAGNOSIS — E8770 Fluid overload, unspecified: Secondary | ICD-10-CM | POA: Diagnosis not present

## 2022-10-26 DIAGNOSIS — I428 Other cardiomyopathies: Secondary | ICD-10-CM | POA: Diagnosis not present

## 2022-10-26 DIAGNOSIS — R630 Anorexia: Secondary | ICD-10-CM | POA: Diagnosis not present

## 2022-10-26 DIAGNOSIS — Z8571 Personal history of Hodgkin lymphoma: Secondary | ICD-10-CM | POA: Diagnosis not present

## 2022-10-27 DIAGNOSIS — Z8543 Personal history of malignant neoplasm of ovary: Secondary | ICD-10-CM | POA: Diagnosis not present

## 2022-10-27 DIAGNOSIS — N189 Chronic kidney disease, unspecified: Secondary | ICD-10-CM | POA: Diagnosis not present

## 2022-10-27 DIAGNOSIS — E8770 Fluid overload, unspecified: Secondary | ICD-10-CM | POA: Diagnosis not present

## 2022-10-27 DIAGNOSIS — G8929 Other chronic pain: Secondary | ICD-10-CM | POA: Diagnosis not present

## 2022-10-27 DIAGNOSIS — Z8571 Personal history of Hodgkin lymphoma: Secondary | ICD-10-CM | POA: Diagnosis not present

## 2022-10-27 DIAGNOSIS — E271 Primary adrenocortical insufficiency: Secondary | ICD-10-CM | POA: Diagnosis not present

## 2022-10-27 DIAGNOSIS — R630 Anorexia: Secondary | ICD-10-CM | POA: Diagnosis not present

## 2022-10-27 DIAGNOSIS — D352 Benign neoplasm of pituitary gland: Secondary | ICD-10-CM | POA: Diagnosis not present

## 2022-10-27 DIAGNOSIS — I130 Hypertensive heart and chronic kidney disease with heart failure and stage 1 through stage 4 chronic kidney disease, or unspecified chronic kidney disease: Secondary | ICD-10-CM | POA: Diagnosis not present

## 2022-10-27 DIAGNOSIS — I428 Other cardiomyopathies: Secondary | ICD-10-CM | POA: Diagnosis not present

## 2022-10-27 DIAGNOSIS — Z853 Personal history of malignant neoplasm of breast: Secondary | ICD-10-CM | POA: Diagnosis not present

## 2022-10-27 DIAGNOSIS — K209 Esophagitis, unspecified without bleeding: Secondary | ICD-10-CM | POA: Diagnosis not present

## 2022-10-27 DIAGNOSIS — K219 Gastro-esophageal reflux disease without esophagitis: Secondary | ICD-10-CM | POA: Diagnosis not present

## 2022-10-27 DIAGNOSIS — J159 Unspecified bacterial pneumonia: Secondary | ICD-10-CM | POA: Diagnosis not present

## 2022-10-27 DIAGNOSIS — Z8541 Personal history of malignant neoplasm of cervix uteri: Secondary | ICD-10-CM | POA: Diagnosis not present

## 2022-10-27 DIAGNOSIS — I5030 Unspecified diastolic (congestive) heart failure: Secondary | ICD-10-CM | POA: Diagnosis not present

## 2022-10-28 DIAGNOSIS — R911 Solitary pulmonary nodule: Secondary | ICD-10-CM | POA: Diagnosis not present

## 2022-10-28 DIAGNOSIS — E271 Primary adrenocortical insufficiency: Secondary | ICD-10-CM | POA: Diagnosis not present

## 2022-10-28 DIAGNOSIS — R630 Anorexia: Secondary | ICD-10-CM | POA: Diagnosis not present

## 2022-10-28 DIAGNOSIS — N189 Chronic kidney disease, unspecified: Secondary | ICD-10-CM | POA: Diagnosis not present

## 2022-10-28 DIAGNOSIS — Z8543 Personal history of malignant neoplasm of ovary: Secondary | ICD-10-CM | POA: Diagnosis not present

## 2022-10-28 DIAGNOSIS — I428 Other cardiomyopathies: Secondary | ICD-10-CM | POA: Diagnosis not present

## 2022-10-28 DIAGNOSIS — K219 Gastro-esophageal reflux disease without esophagitis: Secondary | ICD-10-CM | POA: Diagnosis not present

## 2022-10-28 DIAGNOSIS — Z8571 Personal history of Hodgkin lymphoma: Secondary | ICD-10-CM | POA: Diagnosis not present

## 2022-10-28 DIAGNOSIS — K209 Esophagitis, unspecified without bleeding: Secondary | ICD-10-CM | POA: Diagnosis not present

## 2022-10-28 DIAGNOSIS — G8929 Other chronic pain: Secondary | ICD-10-CM | POA: Diagnosis not present

## 2022-10-28 DIAGNOSIS — I5030 Unspecified diastolic (congestive) heart failure: Secondary | ICD-10-CM | POA: Diagnosis not present

## 2022-10-28 DIAGNOSIS — J159 Unspecified bacterial pneumonia: Secondary | ICD-10-CM | POA: Diagnosis not present

## 2022-10-28 DIAGNOSIS — E8770 Fluid overload, unspecified: Secondary | ICD-10-CM | POA: Diagnosis not present

## 2022-10-28 DIAGNOSIS — I130 Hypertensive heart and chronic kidney disease with heart failure and stage 1 through stage 4 chronic kidney disease, or unspecified chronic kidney disease: Secondary | ICD-10-CM | POA: Diagnosis not present

## 2022-10-28 DIAGNOSIS — Z8541 Personal history of malignant neoplasm of cervix uteri: Secondary | ICD-10-CM | POA: Diagnosis not present

## 2022-10-28 DIAGNOSIS — D352 Benign neoplasm of pituitary gland: Secondary | ICD-10-CM | POA: Diagnosis not present

## 2022-10-28 DIAGNOSIS — Z853 Personal history of malignant neoplasm of breast: Secondary | ICD-10-CM | POA: Diagnosis not present

## 2022-10-29 DIAGNOSIS — E8770 Fluid overload, unspecified: Secondary | ICD-10-CM | POA: Diagnosis not present

## 2022-10-29 DIAGNOSIS — Z853 Personal history of malignant neoplasm of breast: Secondary | ICD-10-CM | POA: Diagnosis not present

## 2022-10-29 DIAGNOSIS — Z8571 Personal history of Hodgkin lymphoma: Secondary | ICD-10-CM | POA: Diagnosis not present

## 2022-10-29 DIAGNOSIS — R630 Anorexia: Secondary | ICD-10-CM | POA: Diagnosis not present

## 2022-10-29 DIAGNOSIS — N189 Chronic kidney disease, unspecified: Secondary | ICD-10-CM | POA: Diagnosis not present

## 2022-10-29 DIAGNOSIS — K219 Gastro-esophageal reflux disease without esophagitis: Secondary | ICD-10-CM | POA: Diagnosis not present

## 2022-10-29 DIAGNOSIS — E271 Primary adrenocortical insufficiency: Secondary | ICD-10-CM | POA: Diagnosis not present

## 2022-10-29 DIAGNOSIS — D352 Benign neoplasm of pituitary gland: Secondary | ICD-10-CM | POA: Diagnosis not present

## 2022-10-29 DIAGNOSIS — Z8541 Personal history of malignant neoplasm of cervix uteri: Secondary | ICD-10-CM | POA: Diagnosis not present

## 2022-10-29 DIAGNOSIS — Z8543 Personal history of malignant neoplasm of ovary: Secondary | ICD-10-CM | POA: Diagnosis not present

## 2022-10-29 DIAGNOSIS — I428 Other cardiomyopathies: Secondary | ICD-10-CM | POA: Diagnosis not present

## 2022-10-29 DIAGNOSIS — J159 Unspecified bacterial pneumonia: Secondary | ICD-10-CM | POA: Diagnosis not present

## 2022-10-29 DIAGNOSIS — G8929 Other chronic pain: Secondary | ICD-10-CM | POA: Diagnosis not present

## 2022-10-29 DIAGNOSIS — I130 Hypertensive heart and chronic kidney disease with heart failure and stage 1 through stage 4 chronic kidney disease, or unspecified chronic kidney disease: Secondary | ICD-10-CM | POA: Diagnosis not present

## 2022-10-29 DIAGNOSIS — K209 Esophagitis, unspecified without bleeding: Secondary | ICD-10-CM | POA: Diagnosis not present

## 2022-10-29 DIAGNOSIS — I5030 Unspecified diastolic (congestive) heart failure: Secondary | ICD-10-CM | POA: Diagnosis not present

## 2022-10-30 DIAGNOSIS — K209 Esophagitis, unspecified without bleeding: Secondary | ICD-10-CM | POA: Diagnosis not present

## 2022-10-30 DIAGNOSIS — Z8541 Personal history of malignant neoplasm of cervix uteri: Secondary | ICD-10-CM | POA: Diagnosis not present

## 2022-10-30 DIAGNOSIS — E8770 Fluid overload, unspecified: Secondary | ICD-10-CM | POA: Diagnosis not present

## 2022-10-30 DIAGNOSIS — G8929 Other chronic pain: Secondary | ICD-10-CM | POA: Diagnosis not present

## 2022-10-30 DIAGNOSIS — Z8571 Personal history of Hodgkin lymphoma: Secondary | ICD-10-CM | POA: Diagnosis not present

## 2022-10-30 DIAGNOSIS — Z853 Personal history of malignant neoplasm of breast: Secondary | ICD-10-CM | POA: Diagnosis not present

## 2022-10-30 DIAGNOSIS — K219 Gastro-esophageal reflux disease without esophagitis: Secondary | ICD-10-CM | POA: Diagnosis not present

## 2022-10-30 DIAGNOSIS — I130 Hypertensive heart and chronic kidney disease with heart failure and stage 1 through stage 4 chronic kidney disease, or unspecified chronic kidney disease: Secondary | ICD-10-CM | POA: Diagnosis not present

## 2022-10-30 DIAGNOSIS — D352 Benign neoplasm of pituitary gland: Secondary | ICD-10-CM | POA: Diagnosis not present

## 2022-10-30 DIAGNOSIS — I5030 Unspecified diastolic (congestive) heart failure: Secondary | ICD-10-CM | POA: Diagnosis not present

## 2022-10-30 DIAGNOSIS — N189 Chronic kidney disease, unspecified: Secondary | ICD-10-CM | POA: Diagnosis not present

## 2022-10-30 DIAGNOSIS — R630 Anorexia: Secondary | ICD-10-CM | POA: Diagnosis not present

## 2022-10-30 DIAGNOSIS — J159 Unspecified bacterial pneumonia: Secondary | ICD-10-CM | POA: Diagnosis not present

## 2022-10-30 DIAGNOSIS — E271 Primary adrenocortical insufficiency: Secondary | ICD-10-CM | POA: Diagnosis not present

## 2022-10-30 DIAGNOSIS — Z8543 Personal history of malignant neoplasm of ovary: Secondary | ICD-10-CM | POA: Diagnosis not present

## 2022-10-30 DIAGNOSIS — I428 Other cardiomyopathies: Secondary | ICD-10-CM | POA: Diagnosis not present

## 2022-10-31 ENCOUNTER — Other Ambulatory Visit: Payer: Self-pay | Admitting: Adult Health

## 2022-10-31 DIAGNOSIS — F331 Major depressive disorder, recurrent, moderate: Secondary | ICD-10-CM

## 2022-10-31 DIAGNOSIS — Z8541 Personal history of malignant neoplasm of cervix uteri: Secondary | ICD-10-CM | POA: Diagnosis not present

## 2022-10-31 DIAGNOSIS — K209 Esophagitis, unspecified without bleeding: Secondary | ICD-10-CM | POA: Diagnosis not present

## 2022-10-31 DIAGNOSIS — J159 Unspecified bacterial pneumonia: Secondary | ICD-10-CM | POA: Diagnosis not present

## 2022-10-31 DIAGNOSIS — I428 Other cardiomyopathies: Secondary | ICD-10-CM | POA: Diagnosis not present

## 2022-10-31 DIAGNOSIS — E271 Primary adrenocortical insufficiency: Secondary | ICD-10-CM | POA: Diagnosis not present

## 2022-10-31 DIAGNOSIS — R630 Anorexia: Secondary | ICD-10-CM | POA: Diagnosis not present

## 2022-10-31 DIAGNOSIS — D352 Benign neoplasm of pituitary gland: Secondary | ICD-10-CM | POA: Diagnosis not present

## 2022-10-31 DIAGNOSIS — E8770 Fluid overload, unspecified: Secondary | ICD-10-CM | POA: Diagnosis not present

## 2022-10-31 DIAGNOSIS — K219 Gastro-esophageal reflux disease without esophagitis: Secondary | ICD-10-CM | POA: Diagnosis not present

## 2022-10-31 DIAGNOSIS — Z853 Personal history of malignant neoplasm of breast: Secondary | ICD-10-CM | POA: Diagnosis not present

## 2022-10-31 DIAGNOSIS — N189 Chronic kidney disease, unspecified: Secondary | ICD-10-CM | POA: Diagnosis not present

## 2022-10-31 DIAGNOSIS — Z8571 Personal history of Hodgkin lymphoma: Secondary | ICD-10-CM | POA: Diagnosis not present

## 2022-10-31 DIAGNOSIS — I5030 Unspecified diastolic (congestive) heart failure: Secondary | ICD-10-CM | POA: Diagnosis not present

## 2022-10-31 DIAGNOSIS — Z8543 Personal history of malignant neoplasm of ovary: Secondary | ICD-10-CM | POA: Diagnosis not present

## 2022-10-31 DIAGNOSIS — I130 Hypertensive heart and chronic kidney disease with heart failure and stage 1 through stage 4 chronic kidney disease, or unspecified chronic kidney disease: Secondary | ICD-10-CM | POA: Diagnosis not present

## 2022-10-31 DIAGNOSIS — G8929 Other chronic pain: Secondary | ICD-10-CM | POA: Diagnosis not present

## 2022-11-01 ENCOUNTER — Other Ambulatory Visit (HOSPITAL_COMMUNITY): Payer: Self-pay

## 2022-11-01 DIAGNOSIS — J159 Unspecified bacterial pneumonia: Secondary | ICD-10-CM | POA: Diagnosis not present

## 2022-11-01 DIAGNOSIS — K219 Gastro-esophageal reflux disease without esophagitis: Secondary | ICD-10-CM | POA: Diagnosis not present

## 2022-11-01 DIAGNOSIS — Z853 Personal history of malignant neoplasm of breast: Secondary | ICD-10-CM | POA: Diagnosis not present

## 2022-11-01 DIAGNOSIS — I428 Other cardiomyopathies: Secondary | ICD-10-CM | POA: Diagnosis not present

## 2022-11-01 DIAGNOSIS — D352 Benign neoplasm of pituitary gland: Secondary | ICD-10-CM | POA: Diagnosis not present

## 2022-11-01 DIAGNOSIS — G8929 Other chronic pain: Secondary | ICD-10-CM | POA: Diagnosis not present

## 2022-11-01 DIAGNOSIS — Z8543 Personal history of malignant neoplasm of ovary: Secondary | ICD-10-CM | POA: Diagnosis not present

## 2022-11-01 DIAGNOSIS — I130 Hypertensive heart and chronic kidney disease with heart failure and stage 1 through stage 4 chronic kidney disease, or unspecified chronic kidney disease: Secondary | ICD-10-CM | POA: Diagnosis not present

## 2022-11-01 DIAGNOSIS — R630 Anorexia: Secondary | ICD-10-CM | POA: Diagnosis not present

## 2022-11-01 DIAGNOSIS — Z8541 Personal history of malignant neoplasm of cervix uteri: Secondary | ICD-10-CM | POA: Diagnosis not present

## 2022-11-01 DIAGNOSIS — I5030 Unspecified diastolic (congestive) heart failure: Secondary | ICD-10-CM | POA: Diagnosis not present

## 2022-11-01 DIAGNOSIS — E8770 Fluid overload, unspecified: Secondary | ICD-10-CM | POA: Diagnosis not present

## 2022-11-01 DIAGNOSIS — E271 Primary adrenocortical insufficiency: Secondary | ICD-10-CM | POA: Diagnosis not present

## 2022-11-01 DIAGNOSIS — Z8571 Personal history of Hodgkin lymphoma: Secondary | ICD-10-CM | POA: Diagnosis not present

## 2022-11-01 DIAGNOSIS — N189 Chronic kidney disease, unspecified: Secondary | ICD-10-CM | POA: Diagnosis not present

## 2022-11-01 DIAGNOSIS — K209 Esophagitis, unspecified without bleeding: Secondary | ICD-10-CM | POA: Diagnosis not present

## 2022-11-02 ENCOUNTER — Ambulatory Visit (INDEPENDENT_AMBULATORY_CARE_PROVIDER_SITE_OTHER): Admitting: Psychiatry

## 2022-11-02 DIAGNOSIS — K219 Gastro-esophageal reflux disease without esophagitis: Secondary | ICD-10-CM | POA: Diagnosis not present

## 2022-11-02 DIAGNOSIS — Z853 Personal history of malignant neoplasm of breast: Secondary | ICD-10-CM | POA: Diagnosis not present

## 2022-11-02 DIAGNOSIS — I428 Other cardiomyopathies: Secondary | ICD-10-CM | POA: Diagnosis not present

## 2022-11-02 DIAGNOSIS — J159 Unspecified bacterial pneumonia: Secondary | ICD-10-CM | POA: Diagnosis not present

## 2022-11-02 DIAGNOSIS — E8770 Fluid overload, unspecified: Secondary | ICD-10-CM | POA: Diagnosis not present

## 2022-11-02 DIAGNOSIS — K209 Esophagitis, unspecified without bleeding: Secondary | ICD-10-CM | POA: Diagnosis not present

## 2022-11-02 DIAGNOSIS — I5030 Unspecified diastolic (congestive) heart failure: Secondary | ICD-10-CM | POA: Diagnosis not present

## 2022-11-02 DIAGNOSIS — G8929 Other chronic pain: Secondary | ICD-10-CM | POA: Diagnosis not present

## 2022-11-02 DIAGNOSIS — R630 Anorexia: Secondary | ICD-10-CM | POA: Diagnosis not present

## 2022-11-02 DIAGNOSIS — Z8543 Personal history of malignant neoplasm of ovary: Secondary | ICD-10-CM | POA: Diagnosis not present

## 2022-11-02 DIAGNOSIS — Z8571 Personal history of Hodgkin lymphoma: Secondary | ICD-10-CM | POA: Diagnosis not present

## 2022-11-02 DIAGNOSIS — E271 Primary adrenocortical insufficiency: Secondary | ICD-10-CM | POA: Diagnosis not present

## 2022-11-02 DIAGNOSIS — F331 Major depressive disorder, recurrent, moderate: Secondary | ICD-10-CM

## 2022-11-02 DIAGNOSIS — Z8541 Personal history of malignant neoplasm of cervix uteri: Secondary | ICD-10-CM | POA: Diagnosis not present

## 2022-11-02 DIAGNOSIS — N189 Chronic kidney disease, unspecified: Secondary | ICD-10-CM | POA: Diagnosis not present

## 2022-11-02 DIAGNOSIS — D352 Benign neoplasm of pituitary gland: Secondary | ICD-10-CM | POA: Diagnosis not present

## 2022-11-02 DIAGNOSIS — I130 Hypertensive heart and chronic kidney disease with heart failure and stage 1 through stage 4 chronic kidney disease, or unspecified chronic kidney disease: Secondary | ICD-10-CM | POA: Diagnosis not present

## 2022-11-02 NOTE — Progress Notes (Signed)
[facility] Counselor/Therapist Progress Note  Patient ID: [name], MRN: [id number],    Date: 11/02/2022  Time Spent: 50 minutes   Treatment Type: Individual Therapy  Virtual Visit via Telehealth Note: MyChart Video session: Connected with patient by a telemedicine/telehealth application, with their informed consent, and verified patient privacy and that I am speaking with the correct person using two identifiers. I discussed the limitations, risks, security and privacy concerns of performing psychotherapy and the availability of in person appointments. I also discussed with the patient that there may be a patient responsible charge related to this service. The patient expressed understanding and agreed to proceed. I discussed the treatment planning with the patient. The patient was provided an opportunity to ask questions and all were answered. The patient agreed with the plan and demonstrated an understanding of the instructions. The patient was advised to call  our office if  symptoms worsen or feel they are in a crisis state and need immediate contact.   Therapist Location: office Patient Location: home   Reported Symptoms: depression, "some anxiety", "not thinking as much", "hard to put into words, I feel like I'm a burden"  Mental Status Exam:  Appearance:   Casual     Behavior:  Appropriate and Sharing  Motor:  Very affected by her physical symptoms  Speech/Language:   Clear and Coherent  Affect:  Depressed and Tearful  Mood:  anxious and depressed  Thought process:  goal directed  Thought content:    WNL  Sensory/Perceptual disturbances:    WNL  Orientation:  oriented to person, place, time/date, situation, day of week, month of year, year, and stated date of Feb.   Attention:  Good  Concentration:  Good  Memory:  Some issues that are impacted by her health status currently  Fund of knowledge:   Good  Insight:    Good and Fair  Judgment:   Good  Impulse  Control:  Good   Risk Assessment: Danger to Self:  No Self-injurious Behavior: No Danger to Others: No Duty to Warn:no Physical Aggression / Violence:No  Access to Firearms a concern: No  Gang Involvement:No   Subjective:  Learned yesterday that patient has been transferred from palliative care to hospice care and she was very appreciative to have her appt today. Exhausted but wanted to talk and vent and did so at length today. Frustrated, some anger, some anxiety, and did well in expressing them very openly today, as patient stated they have been building up, including the feelings of "it not being fair".  Continued pulmonary and cardiac issues, and hospice will be trying to help keep her more comfortable. Patient shared that if she improves she could go back to palliative care, but has some doubt at this point. Very open emotionally and feeling heard in our time together.   Interventions: Cognitive Behavioral Therapy and Ego-Supportive  Long-term goal:  Patient will confront her anxiety, fears, anger reported re: her illness and use time in therapy and beyond to work on this, while receiving feedback and support from therapist. Help her determine what she feels is realistic for her considering her health challenges and difficult prognosis.  Short-term goal: Participate in therapy and initiate communication of needs and desires at this point in her struggle physically.  Strategies: Share and talk through anxiety, anger, and depression that are experienced and work to find and use helpful coping tools to better manage those feelings.  Diagnosis:   ICD-10-CM   1.  Major depressive disorder, recurrent episode, moderate ([facility])  F33.1      Plan: Patient today having session after her recent transfer from palliative care to hospice care.  As noted above she worked well in expressing some frustrations, anger, and anxiety which she acknowledged had been building up.  Very difficult time for her and  seemed to really appreciate our session in being able to freely express her concerns and feelings, and being heard.  States some moments are better than others and still struggles some with a sense of "unfairness".  Brother, according to patient, remains her closest support within the family.  Working on increased acceptance "but not giving up hope".  Freely discussed her feelings about the transfer from palliative care to hospice care and what all that means for her.  Acknowledges the uncertainty of her declining health and some of the anxiety she is having about that. Encouraged patient and her practice of goal-directed behaviors in her own self-care and self talk, and freely asking for help when she needs it.  Patient is now a "DNR" patient with which she agreed when that decision was made with medical staff.  My contact with her is primarily supportive in nature and helping her deal with her emotional symptoms as she has stated that "even though I am terminally ill, I want to continue receiving support while I can".  Patient shares today that she has recently been transferred from palliative care to hospice care. Support of patient continues as she is trying to manage her feelings and anxieties at this point.  Desires and needs continued supportive/therapeutic contact to help her be able to express and work through her emotions and the uncertainties that she is encountering with her declining health.  Next appointment within 1 to 2 weeks.  This record has been created using [employer].  Chart creation errors have been sought, but may not always have been located and corrected.  Such creation errors do not reflect on the standard of medical care provided.   Shanon Ace, LCSW

## 2022-11-03 DIAGNOSIS — N189 Chronic kidney disease, unspecified: Secondary | ICD-10-CM | POA: Diagnosis not present

## 2022-11-03 DIAGNOSIS — R630 Anorexia: Secondary | ICD-10-CM | POA: Diagnosis not present

## 2022-11-03 DIAGNOSIS — I5030 Unspecified diastolic (congestive) heart failure: Secondary | ICD-10-CM | POA: Diagnosis not present

## 2022-11-03 DIAGNOSIS — K219 Gastro-esophageal reflux disease without esophagitis: Secondary | ICD-10-CM | POA: Diagnosis not present

## 2022-11-03 DIAGNOSIS — J159 Unspecified bacterial pneumonia: Secondary | ICD-10-CM | POA: Diagnosis not present

## 2022-11-03 DIAGNOSIS — D352 Benign neoplasm of pituitary gland: Secondary | ICD-10-CM | POA: Diagnosis not present

## 2022-11-03 DIAGNOSIS — Z8543 Personal history of malignant neoplasm of ovary: Secondary | ICD-10-CM | POA: Diagnosis not present

## 2022-11-03 DIAGNOSIS — I428 Other cardiomyopathies: Secondary | ICD-10-CM | POA: Diagnosis not present

## 2022-11-03 DIAGNOSIS — Z8541 Personal history of malignant neoplasm of cervix uteri: Secondary | ICD-10-CM | POA: Diagnosis not present

## 2022-11-03 DIAGNOSIS — Z8571 Personal history of Hodgkin lymphoma: Secondary | ICD-10-CM | POA: Diagnosis not present

## 2022-11-03 DIAGNOSIS — Z853 Personal history of malignant neoplasm of breast: Secondary | ICD-10-CM | POA: Diagnosis not present

## 2022-11-03 DIAGNOSIS — K209 Esophagitis, unspecified without bleeding: Secondary | ICD-10-CM | POA: Diagnosis not present

## 2022-11-03 DIAGNOSIS — I130 Hypertensive heart and chronic kidney disease with heart failure and stage 1 through stage 4 chronic kidney disease, or unspecified chronic kidney disease: Secondary | ICD-10-CM | POA: Diagnosis not present

## 2022-11-03 DIAGNOSIS — E8770 Fluid overload, unspecified: Secondary | ICD-10-CM | POA: Diagnosis not present

## 2022-11-03 DIAGNOSIS — G8929 Other chronic pain: Secondary | ICD-10-CM | POA: Diagnosis not present

## 2022-11-03 DIAGNOSIS — E271 Primary adrenocortical insufficiency: Secondary | ICD-10-CM | POA: Diagnosis not present

## 2022-11-04 DIAGNOSIS — J159 Unspecified bacterial pneumonia: Secondary | ICD-10-CM | POA: Diagnosis not present

## 2022-11-04 DIAGNOSIS — Z8571 Personal history of Hodgkin lymphoma: Secondary | ICD-10-CM | POA: Diagnosis not present

## 2022-11-04 DIAGNOSIS — Z853 Personal history of malignant neoplasm of breast: Secondary | ICD-10-CM | POA: Diagnosis not present

## 2022-11-04 DIAGNOSIS — D352 Benign neoplasm of pituitary gland: Secondary | ICD-10-CM | POA: Diagnosis not present

## 2022-11-04 DIAGNOSIS — Z8543 Personal history of malignant neoplasm of ovary: Secondary | ICD-10-CM | POA: Diagnosis not present

## 2022-11-04 DIAGNOSIS — R630 Anorexia: Secondary | ICD-10-CM | POA: Diagnosis not present

## 2022-11-04 DIAGNOSIS — K219 Gastro-esophageal reflux disease without esophagitis: Secondary | ICD-10-CM | POA: Diagnosis not present

## 2022-11-04 DIAGNOSIS — I428 Other cardiomyopathies: Secondary | ICD-10-CM | POA: Diagnosis not present

## 2022-11-04 DIAGNOSIS — N189 Chronic kidney disease, unspecified: Secondary | ICD-10-CM | POA: Diagnosis not present

## 2022-11-04 DIAGNOSIS — I130 Hypertensive heart and chronic kidney disease with heart failure and stage 1 through stage 4 chronic kidney disease, or unspecified chronic kidney disease: Secondary | ICD-10-CM | POA: Diagnosis not present

## 2022-11-04 DIAGNOSIS — E271 Primary adrenocortical insufficiency: Secondary | ICD-10-CM | POA: Diagnosis not present

## 2022-11-04 DIAGNOSIS — I5030 Unspecified diastolic (congestive) heart failure: Secondary | ICD-10-CM | POA: Diagnosis not present

## 2022-11-04 DIAGNOSIS — E8770 Fluid overload, unspecified: Secondary | ICD-10-CM | POA: Diagnosis not present

## 2022-11-04 DIAGNOSIS — G8929 Other chronic pain: Secondary | ICD-10-CM | POA: Diagnosis not present

## 2022-11-04 DIAGNOSIS — Z8541 Personal history of malignant neoplasm of cervix uteri: Secondary | ICD-10-CM | POA: Diagnosis not present

## 2022-11-04 DIAGNOSIS — K209 Esophagitis, unspecified without bleeding: Secondary | ICD-10-CM | POA: Diagnosis not present

## 2022-11-05 DIAGNOSIS — I130 Hypertensive heart and chronic kidney disease with heart failure and stage 1 through stage 4 chronic kidney disease, or unspecified chronic kidney disease: Secondary | ICD-10-CM | POA: Diagnosis not present

## 2022-11-05 DIAGNOSIS — G8929 Other chronic pain: Secondary | ICD-10-CM | POA: Diagnosis not present

## 2022-11-05 DIAGNOSIS — Z853 Personal history of malignant neoplasm of breast: Secondary | ICD-10-CM | POA: Diagnosis not present

## 2022-11-05 DIAGNOSIS — Z8543 Personal history of malignant neoplasm of ovary: Secondary | ICD-10-CM | POA: Diagnosis not present

## 2022-11-05 DIAGNOSIS — J159 Unspecified bacterial pneumonia: Secondary | ICD-10-CM | POA: Diagnosis not present

## 2022-11-05 DIAGNOSIS — I428 Other cardiomyopathies: Secondary | ICD-10-CM | POA: Diagnosis not present

## 2022-11-05 DIAGNOSIS — K219 Gastro-esophageal reflux disease without esophagitis: Secondary | ICD-10-CM | POA: Diagnosis not present

## 2022-11-05 DIAGNOSIS — N189 Chronic kidney disease, unspecified: Secondary | ICD-10-CM | POA: Diagnosis not present

## 2022-11-05 DIAGNOSIS — E271 Primary adrenocortical insufficiency: Secondary | ICD-10-CM | POA: Diagnosis not present

## 2022-11-05 DIAGNOSIS — E8770 Fluid overload, unspecified: Secondary | ICD-10-CM | POA: Diagnosis not present

## 2022-11-05 DIAGNOSIS — Z8541 Personal history of malignant neoplasm of cervix uteri: Secondary | ICD-10-CM | POA: Diagnosis not present

## 2022-11-05 DIAGNOSIS — K209 Esophagitis, unspecified without bleeding: Secondary | ICD-10-CM | POA: Diagnosis not present

## 2022-11-05 DIAGNOSIS — R630 Anorexia: Secondary | ICD-10-CM | POA: Diagnosis not present

## 2022-11-05 DIAGNOSIS — I5030 Unspecified diastolic (congestive) heart failure: Secondary | ICD-10-CM | POA: Diagnosis not present

## 2022-11-05 DIAGNOSIS — D352 Benign neoplasm of pituitary gland: Secondary | ICD-10-CM | POA: Diagnosis not present

## 2022-11-05 DIAGNOSIS — Z8571 Personal history of Hodgkin lymphoma: Secondary | ICD-10-CM | POA: Diagnosis not present

## 2022-11-06 ENCOUNTER — Other Ambulatory Visit (HOSPITAL_COMMUNITY): Payer: Self-pay

## 2022-11-06 DIAGNOSIS — N189 Chronic kidney disease, unspecified: Secondary | ICD-10-CM | POA: Diagnosis not present

## 2022-11-06 DIAGNOSIS — Z8543 Personal history of malignant neoplasm of ovary: Secondary | ICD-10-CM | POA: Diagnosis not present

## 2022-11-06 DIAGNOSIS — Z853 Personal history of malignant neoplasm of breast: Secondary | ICD-10-CM | POA: Diagnosis not present

## 2022-11-06 DIAGNOSIS — K209 Esophagitis, unspecified without bleeding: Secondary | ICD-10-CM | POA: Diagnosis not present

## 2022-11-06 DIAGNOSIS — I130 Hypertensive heart and chronic kidney disease with heart failure and stage 1 through stage 4 chronic kidney disease, or unspecified chronic kidney disease: Secondary | ICD-10-CM | POA: Diagnosis not present

## 2022-11-06 DIAGNOSIS — D352 Benign neoplasm of pituitary gland: Secondary | ICD-10-CM | POA: Diagnosis not present

## 2022-11-06 DIAGNOSIS — J159 Unspecified bacterial pneumonia: Secondary | ICD-10-CM | POA: Diagnosis not present

## 2022-11-06 DIAGNOSIS — Z8571 Personal history of Hodgkin lymphoma: Secondary | ICD-10-CM | POA: Diagnosis not present

## 2022-11-06 DIAGNOSIS — I428 Other cardiomyopathies: Secondary | ICD-10-CM | POA: Diagnosis not present

## 2022-11-06 DIAGNOSIS — E271 Primary adrenocortical insufficiency: Secondary | ICD-10-CM | POA: Diagnosis not present

## 2022-11-06 DIAGNOSIS — R630 Anorexia: Secondary | ICD-10-CM | POA: Diagnosis not present

## 2022-11-06 DIAGNOSIS — E8770 Fluid overload, unspecified: Secondary | ICD-10-CM | POA: Diagnosis not present

## 2022-11-06 DIAGNOSIS — Z8541 Personal history of malignant neoplasm of cervix uteri: Secondary | ICD-10-CM | POA: Diagnosis not present

## 2022-11-06 DIAGNOSIS — G8929 Other chronic pain: Secondary | ICD-10-CM | POA: Diagnosis not present

## 2022-11-06 DIAGNOSIS — I5030 Unspecified diastolic (congestive) heart failure: Secondary | ICD-10-CM | POA: Diagnosis not present

## 2022-11-06 DIAGNOSIS — K219 Gastro-esophageal reflux disease without esophagitis: Secondary | ICD-10-CM | POA: Diagnosis not present

## 2022-11-07 ENCOUNTER — Other Ambulatory Visit: Payer: Self-pay | Admitting: Adult Health

## 2022-11-07 ENCOUNTER — Ambulatory Visit: Payer: Medicare Other | Admitting: Psychiatry

## 2022-11-07 DIAGNOSIS — Z8543 Personal history of malignant neoplasm of ovary: Secondary | ICD-10-CM | POA: Diagnosis not present

## 2022-11-07 DIAGNOSIS — Z853 Personal history of malignant neoplasm of breast: Secondary | ICD-10-CM | POA: Diagnosis not present

## 2022-11-07 DIAGNOSIS — K209 Esophagitis, unspecified without bleeding: Secondary | ICD-10-CM | POA: Diagnosis not present

## 2022-11-07 DIAGNOSIS — R630 Anorexia: Secondary | ICD-10-CM | POA: Diagnosis not present

## 2022-11-07 DIAGNOSIS — K219 Gastro-esophageal reflux disease without esophagitis: Secondary | ICD-10-CM | POA: Diagnosis not present

## 2022-11-07 DIAGNOSIS — J159 Unspecified bacterial pneumonia: Secondary | ICD-10-CM | POA: Diagnosis not present

## 2022-11-07 DIAGNOSIS — E8770 Fluid overload, unspecified: Secondary | ICD-10-CM | POA: Diagnosis not present

## 2022-11-07 DIAGNOSIS — Z8571 Personal history of Hodgkin lymphoma: Secondary | ICD-10-CM | POA: Diagnosis not present

## 2022-11-07 DIAGNOSIS — Z8541 Personal history of malignant neoplasm of cervix uteri: Secondary | ICD-10-CM | POA: Diagnosis not present

## 2022-11-07 DIAGNOSIS — N189 Chronic kidney disease, unspecified: Secondary | ICD-10-CM | POA: Diagnosis not present

## 2022-11-07 DIAGNOSIS — D352 Benign neoplasm of pituitary gland: Secondary | ICD-10-CM | POA: Diagnosis not present

## 2022-11-07 DIAGNOSIS — F411 Generalized anxiety disorder: Secondary | ICD-10-CM

## 2022-11-07 DIAGNOSIS — I130 Hypertensive heart and chronic kidney disease with heart failure and stage 1 through stage 4 chronic kidney disease, or unspecified chronic kidney disease: Secondary | ICD-10-CM | POA: Diagnosis not present

## 2022-11-07 DIAGNOSIS — I428 Other cardiomyopathies: Secondary | ICD-10-CM | POA: Diagnosis not present

## 2022-11-07 DIAGNOSIS — E271 Primary adrenocortical insufficiency: Secondary | ICD-10-CM | POA: Diagnosis not present

## 2022-11-07 DIAGNOSIS — G8929 Other chronic pain: Secondary | ICD-10-CM | POA: Diagnosis not present

## 2022-11-07 DIAGNOSIS — I5030 Unspecified diastolic (congestive) heart failure: Secondary | ICD-10-CM | POA: Diagnosis not present

## 2022-11-08 DIAGNOSIS — Z853 Personal history of malignant neoplasm of breast: Secondary | ICD-10-CM | POA: Diagnosis not present

## 2022-11-08 DIAGNOSIS — R630 Anorexia: Secondary | ICD-10-CM | POA: Diagnosis not present

## 2022-11-08 DIAGNOSIS — N189 Chronic kidney disease, unspecified: Secondary | ICD-10-CM | POA: Diagnosis not present

## 2022-11-08 DIAGNOSIS — K219 Gastro-esophageal reflux disease without esophagitis: Secondary | ICD-10-CM | POA: Diagnosis not present

## 2022-11-08 DIAGNOSIS — G8929 Other chronic pain: Secondary | ICD-10-CM | POA: Diagnosis not present

## 2022-11-08 DIAGNOSIS — K209 Esophagitis, unspecified without bleeding: Secondary | ICD-10-CM | POA: Diagnosis not present

## 2022-11-08 DIAGNOSIS — E8770 Fluid overload, unspecified: Secondary | ICD-10-CM | POA: Diagnosis not present

## 2022-11-08 DIAGNOSIS — Z8543 Personal history of malignant neoplasm of ovary: Secondary | ICD-10-CM | POA: Diagnosis not present

## 2022-11-08 DIAGNOSIS — I428 Other cardiomyopathies: Secondary | ICD-10-CM | POA: Diagnosis not present

## 2022-11-08 DIAGNOSIS — E271 Primary adrenocortical insufficiency: Secondary | ICD-10-CM | POA: Diagnosis not present

## 2022-11-08 DIAGNOSIS — Z8571 Personal history of Hodgkin lymphoma: Secondary | ICD-10-CM | POA: Diagnosis not present

## 2022-11-08 DIAGNOSIS — J159 Unspecified bacterial pneumonia: Secondary | ICD-10-CM | POA: Diagnosis not present

## 2022-11-08 DIAGNOSIS — Z8541 Personal history of malignant neoplasm of cervix uteri: Secondary | ICD-10-CM | POA: Diagnosis not present

## 2022-11-08 DIAGNOSIS — D352 Benign neoplasm of pituitary gland: Secondary | ICD-10-CM | POA: Diagnosis not present

## 2022-11-08 DIAGNOSIS — I130 Hypertensive heart and chronic kidney disease with heart failure and stage 1 through stage 4 chronic kidney disease, or unspecified chronic kidney disease: Secondary | ICD-10-CM | POA: Diagnosis not present

## 2022-11-08 DIAGNOSIS — I5030 Unspecified diastolic (congestive) heart failure: Secondary | ICD-10-CM | POA: Diagnosis not present

## 2022-11-08 NOTE — Progress Notes (Signed)
Patient had to cancel due to physical issues and hospice coming in to home.

## 2022-11-09 DIAGNOSIS — R630 Anorexia: Secondary | ICD-10-CM | POA: Diagnosis not present

## 2022-11-09 DIAGNOSIS — J159 Unspecified bacterial pneumonia: Secondary | ICD-10-CM | POA: Diagnosis not present

## 2022-11-09 DIAGNOSIS — N189 Chronic kidney disease, unspecified: Secondary | ICD-10-CM | POA: Diagnosis not present

## 2022-11-09 DIAGNOSIS — K219 Gastro-esophageal reflux disease without esophagitis: Secondary | ICD-10-CM | POA: Diagnosis not present

## 2022-11-09 DIAGNOSIS — J986 Disorders of diaphragm: Secondary | ICD-10-CM | POA: Diagnosis not present

## 2022-11-09 DIAGNOSIS — Z8541 Personal history of malignant neoplasm of cervix uteri: Secondary | ICD-10-CM | POA: Diagnosis not present

## 2022-11-09 DIAGNOSIS — G8929 Other chronic pain: Secondary | ICD-10-CM | POA: Diagnosis not present

## 2022-11-09 DIAGNOSIS — E8770 Fluid overload, unspecified: Secondary | ICD-10-CM | POA: Diagnosis not present

## 2022-11-09 DIAGNOSIS — D352 Benign neoplasm of pituitary gland: Secondary | ICD-10-CM | POA: Diagnosis not present

## 2022-11-09 DIAGNOSIS — E271 Primary adrenocortical insufficiency: Secondary | ICD-10-CM | POA: Diagnosis not present

## 2022-11-09 DIAGNOSIS — Z8571 Personal history of Hodgkin lymphoma: Secondary | ICD-10-CM | POA: Diagnosis not present

## 2022-11-09 DIAGNOSIS — Z8543 Personal history of malignant neoplasm of ovary: Secondary | ICD-10-CM | POA: Diagnosis not present

## 2022-11-09 DIAGNOSIS — I130 Hypertensive heart and chronic kidney disease with heart failure and stage 1 through stage 4 chronic kidney disease, or unspecified chronic kidney disease: Secondary | ICD-10-CM | POA: Diagnosis not present

## 2022-11-09 DIAGNOSIS — Z853 Personal history of malignant neoplasm of breast: Secondary | ICD-10-CM | POA: Diagnosis not present

## 2022-11-09 DIAGNOSIS — I428 Other cardiomyopathies: Secondary | ICD-10-CM | POA: Diagnosis not present

## 2022-11-09 DIAGNOSIS — K209 Esophagitis, unspecified without bleeding: Secondary | ICD-10-CM | POA: Diagnosis not present

## 2022-11-09 DIAGNOSIS — I5030 Unspecified diastolic (congestive) heart failure: Secondary | ICD-10-CM | POA: Diagnosis not present

## 2022-11-10 DIAGNOSIS — N189 Chronic kidney disease, unspecified: Secondary | ICD-10-CM | POA: Diagnosis not present

## 2022-11-10 DIAGNOSIS — G8929 Other chronic pain: Secondary | ICD-10-CM | POA: Diagnosis not present

## 2022-11-10 DIAGNOSIS — Z8571 Personal history of Hodgkin lymphoma: Secondary | ICD-10-CM | POA: Diagnosis not present

## 2022-11-10 DIAGNOSIS — E8770 Fluid overload, unspecified: Secondary | ICD-10-CM | POA: Diagnosis not present

## 2022-11-10 DIAGNOSIS — Z8543 Personal history of malignant neoplasm of ovary: Secondary | ICD-10-CM | POA: Diagnosis not present

## 2022-11-10 DIAGNOSIS — K219 Gastro-esophageal reflux disease without esophagitis: Secondary | ICD-10-CM | POA: Diagnosis not present

## 2022-11-10 DIAGNOSIS — Z8541 Personal history of malignant neoplasm of cervix uteri: Secondary | ICD-10-CM | POA: Diagnosis not present

## 2022-11-10 DIAGNOSIS — E271 Primary adrenocortical insufficiency: Secondary | ICD-10-CM | POA: Diagnosis not present

## 2022-11-10 DIAGNOSIS — I5030 Unspecified diastolic (congestive) heart failure: Secondary | ICD-10-CM | POA: Diagnosis not present

## 2022-11-10 DIAGNOSIS — D352 Benign neoplasm of pituitary gland: Secondary | ICD-10-CM | POA: Diagnosis not present

## 2022-11-10 DIAGNOSIS — K209 Esophagitis, unspecified without bleeding: Secondary | ICD-10-CM | POA: Diagnosis not present

## 2022-11-10 DIAGNOSIS — I130 Hypertensive heart and chronic kidney disease with heart failure and stage 1 through stage 4 chronic kidney disease, or unspecified chronic kidney disease: Secondary | ICD-10-CM | POA: Diagnosis not present

## 2022-11-10 DIAGNOSIS — R630 Anorexia: Secondary | ICD-10-CM | POA: Diagnosis not present

## 2022-11-10 DIAGNOSIS — J159 Unspecified bacterial pneumonia: Secondary | ICD-10-CM | POA: Diagnosis not present

## 2022-11-10 DIAGNOSIS — I428 Other cardiomyopathies: Secondary | ICD-10-CM | POA: Diagnosis not present

## 2022-11-10 DIAGNOSIS — Z853 Personal history of malignant neoplasm of breast: Secondary | ICD-10-CM | POA: Diagnosis not present

## 2022-11-11 DIAGNOSIS — I130 Hypertensive heart and chronic kidney disease with heart failure and stage 1 through stage 4 chronic kidney disease, or unspecified chronic kidney disease: Secondary | ICD-10-CM | POA: Diagnosis not present

## 2022-11-11 DIAGNOSIS — I5030 Unspecified diastolic (congestive) heart failure: Secondary | ICD-10-CM | POA: Diagnosis not present

## 2022-11-11 DIAGNOSIS — I428 Other cardiomyopathies: Secondary | ICD-10-CM | POA: Diagnosis not present

## 2022-11-11 DIAGNOSIS — K209 Esophagitis, unspecified without bleeding: Secondary | ICD-10-CM | POA: Diagnosis not present

## 2022-11-11 DIAGNOSIS — Z8571 Personal history of Hodgkin lymphoma: Secondary | ICD-10-CM | POA: Diagnosis not present

## 2022-11-11 DIAGNOSIS — Z853 Personal history of malignant neoplasm of breast: Secondary | ICD-10-CM | POA: Diagnosis not present

## 2022-11-11 DIAGNOSIS — K219 Gastro-esophageal reflux disease without esophagitis: Secondary | ICD-10-CM | POA: Diagnosis not present

## 2022-11-11 DIAGNOSIS — E8770 Fluid overload, unspecified: Secondary | ICD-10-CM | POA: Diagnosis not present

## 2022-11-11 DIAGNOSIS — R630 Anorexia: Secondary | ICD-10-CM | POA: Diagnosis not present

## 2022-11-11 DIAGNOSIS — Z8543 Personal history of malignant neoplasm of ovary: Secondary | ICD-10-CM | POA: Diagnosis not present

## 2022-11-11 DIAGNOSIS — J159 Unspecified bacterial pneumonia: Secondary | ICD-10-CM | POA: Diagnosis not present

## 2022-11-11 DIAGNOSIS — Z8541 Personal history of malignant neoplasm of cervix uteri: Secondary | ICD-10-CM | POA: Diagnosis not present

## 2022-11-11 DIAGNOSIS — G8929 Other chronic pain: Secondary | ICD-10-CM | POA: Diagnosis not present

## 2022-11-11 DIAGNOSIS — E271 Primary adrenocortical insufficiency: Secondary | ICD-10-CM | POA: Diagnosis not present

## 2022-11-11 DIAGNOSIS — N189 Chronic kidney disease, unspecified: Secondary | ICD-10-CM | POA: Diagnosis not present

## 2022-11-11 DIAGNOSIS — D352 Benign neoplasm of pituitary gland: Secondary | ICD-10-CM | POA: Diagnosis not present

## 2022-11-12 DIAGNOSIS — K209 Esophagitis, unspecified without bleeding: Secondary | ICD-10-CM | POA: Diagnosis not present

## 2022-11-12 DIAGNOSIS — K219 Gastro-esophageal reflux disease without esophagitis: Secondary | ICD-10-CM | POA: Diagnosis not present

## 2022-11-12 DIAGNOSIS — G8929 Other chronic pain: Secondary | ICD-10-CM | POA: Diagnosis not present

## 2022-11-12 DIAGNOSIS — Z853 Personal history of malignant neoplasm of breast: Secondary | ICD-10-CM | POA: Diagnosis not present

## 2022-11-12 DIAGNOSIS — I428 Other cardiomyopathies: Secondary | ICD-10-CM | POA: Diagnosis not present

## 2022-11-12 DIAGNOSIS — R630 Anorexia: Secondary | ICD-10-CM | POA: Diagnosis not present

## 2022-11-12 DIAGNOSIS — I5030 Unspecified diastolic (congestive) heart failure: Secondary | ICD-10-CM | POA: Diagnosis not present

## 2022-11-12 DIAGNOSIS — E271 Primary adrenocortical insufficiency: Secondary | ICD-10-CM | POA: Diagnosis not present

## 2022-11-12 DIAGNOSIS — Z8571 Personal history of Hodgkin lymphoma: Secondary | ICD-10-CM | POA: Diagnosis not present

## 2022-11-12 DIAGNOSIS — I130 Hypertensive heart and chronic kidney disease with heart failure and stage 1 through stage 4 chronic kidney disease, or unspecified chronic kidney disease: Secondary | ICD-10-CM | POA: Diagnosis not present

## 2022-11-12 DIAGNOSIS — Z8541 Personal history of malignant neoplasm of cervix uteri: Secondary | ICD-10-CM | POA: Diagnosis not present

## 2022-11-12 DIAGNOSIS — E8770 Fluid overload, unspecified: Secondary | ICD-10-CM | POA: Diagnosis not present

## 2022-11-12 DIAGNOSIS — N189 Chronic kidney disease, unspecified: Secondary | ICD-10-CM | POA: Diagnosis not present

## 2022-11-12 DIAGNOSIS — Z8543 Personal history of malignant neoplasm of ovary: Secondary | ICD-10-CM | POA: Diagnosis not present

## 2022-11-12 DIAGNOSIS — D352 Benign neoplasm of pituitary gland: Secondary | ICD-10-CM | POA: Diagnosis not present

## 2022-11-12 DIAGNOSIS — J159 Unspecified bacterial pneumonia: Secondary | ICD-10-CM | POA: Diagnosis not present

## 2022-11-13 DIAGNOSIS — I130 Hypertensive heart and chronic kidney disease with heart failure and stage 1 through stage 4 chronic kidney disease, or unspecified chronic kidney disease: Secondary | ICD-10-CM | POA: Diagnosis not present

## 2022-11-13 DIAGNOSIS — Z8571 Personal history of Hodgkin lymphoma: Secondary | ICD-10-CM | POA: Diagnosis not present

## 2022-11-13 DIAGNOSIS — J159 Unspecified bacterial pneumonia: Secondary | ICD-10-CM | POA: Diagnosis not present

## 2022-11-13 DIAGNOSIS — R630 Anorexia: Secondary | ICD-10-CM | POA: Diagnosis not present

## 2022-11-13 DIAGNOSIS — Z8541 Personal history of malignant neoplasm of cervix uteri: Secondary | ICD-10-CM | POA: Diagnosis not present

## 2022-11-13 DIAGNOSIS — G8929 Other chronic pain: Secondary | ICD-10-CM | POA: Diagnosis not present

## 2022-11-13 DIAGNOSIS — Z853 Personal history of malignant neoplasm of breast: Secondary | ICD-10-CM | POA: Diagnosis not present

## 2022-11-13 DIAGNOSIS — K219 Gastro-esophageal reflux disease without esophagitis: Secondary | ICD-10-CM | POA: Diagnosis not present

## 2022-11-13 DIAGNOSIS — D352 Benign neoplasm of pituitary gland: Secondary | ICD-10-CM | POA: Diagnosis not present

## 2022-11-13 DIAGNOSIS — E271 Primary adrenocortical insufficiency: Secondary | ICD-10-CM | POA: Diagnosis not present

## 2022-11-13 DIAGNOSIS — Z8543 Personal history of malignant neoplasm of ovary: Secondary | ICD-10-CM | POA: Diagnosis not present

## 2022-11-13 DIAGNOSIS — I5030 Unspecified diastolic (congestive) heart failure: Secondary | ICD-10-CM | POA: Diagnosis not present

## 2022-11-13 DIAGNOSIS — K209 Esophagitis, unspecified without bleeding: Secondary | ICD-10-CM | POA: Diagnosis not present

## 2022-11-13 DIAGNOSIS — N189 Chronic kidney disease, unspecified: Secondary | ICD-10-CM | POA: Diagnosis not present

## 2022-11-13 DIAGNOSIS — I428 Other cardiomyopathies: Secondary | ICD-10-CM | POA: Diagnosis not present

## 2022-11-13 DIAGNOSIS — E8770 Fluid overload, unspecified: Secondary | ICD-10-CM | POA: Diagnosis not present

## 2022-11-14 DIAGNOSIS — G8929 Other chronic pain: Secondary | ICD-10-CM | POA: Diagnosis not present

## 2022-11-14 DIAGNOSIS — K219 Gastro-esophageal reflux disease without esophagitis: Secondary | ICD-10-CM | POA: Diagnosis not present

## 2022-11-14 DIAGNOSIS — Z853 Personal history of malignant neoplasm of breast: Secondary | ICD-10-CM | POA: Diagnosis not present

## 2022-11-14 DIAGNOSIS — E271 Primary adrenocortical insufficiency: Secondary | ICD-10-CM | POA: Diagnosis not present

## 2022-11-14 DIAGNOSIS — I428 Other cardiomyopathies: Secondary | ICD-10-CM | POA: Diagnosis not present

## 2022-11-14 DIAGNOSIS — R630 Anorexia: Secondary | ICD-10-CM | POA: Diagnosis not present

## 2022-11-14 DIAGNOSIS — K209 Esophagitis, unspecified without bleeding: Secondary | ICD-10-CM | POA: Diagnosis not present

## 2022-11-14 DIAGNOSIS — Z8571 Personal history of Hodgkin lymphoma: Secondary | ICD-10-CM | POA: Diagnosis not present

## 2022-11-14 DIAGNOSIS — J159 Unspecified bacterial pneumonia: Secondary | ICD-10-CM | POA: Diagnosis not present

## 2022-11-14 DIAGNOSIS — I5030 Unspecified diastolic (congestive) heart failure: Secondary | ICD-10-CM | POA: Diagnosis not present

## 2022-11-14 DIAGNOSIS — D352 Benign neoplasm of pituitary gland: Secondary | ICD-10-CM | POA: Diagnosis not present

## 2022-11-14 DIAGNOSIS — N189 Chronic kidney disease, unspecified: Secondary | ICD-10-CM | POA: Diagnosis not present

## 2022-11-14 DIAGNOSIS — Z8543 Personal history of malignant neoplasm of ovary: Secondary | ICD-10-CM | POA: Diagnosis not present

## 2022-11-14 DIAGNOSIS — E8770 Fluid overload, unspecified: Secondary | ICD-10-CM | POA: Diagnosis not present

## 2022-11-14 DIAGNOSIS — I130 Hypertensive heart and chronic kidney disease with heart failure and stage 1 through stage 4 chronic kidney disease, or unspecified chronic kidney disease: Secondary | ICD-10-CM | POA: Diagnosis not present

## 2022-11-14 DIAGNOSIS — Z8541 Personal history of malignant neoplasm of cervix uteri: Secondary | ICD-10-CM | POA: Diagnosis not present

## 2022-11-15 DIAGNOSIS — N189 Chronic kidney disease, unspecified: Secondary | ICD-10-CM | POA: Diagnosis not present

## 2022-11-15 DIAGNOSIS — K209 Esophagitis, unspecified without bleeding: Secondary | ICD-10-CM | POA: Diagnosis not present

## 2022-11-15 DIAGNOSIS — D352 Benign neoplasm of pituitary gland: Secondary | ICD-10-CM | POA: Diagnosis not present

## 2022-11-15 DIAGNOSIS — E271 Primary adrenocortical insufficiency: Secondary | ICD-10-CM | POA: Diagnosis not present

## 2022-11-15 DIAGNOSIS — K219 Gastro-esophageal reflux disease without esophagitis: Secondary | ICD-10-CM | POA: Diagnosis not present

## 2022-11-15 DIAGNOSIS — E8770 Fluid overload, unspecified: Secondary | ICD-10-CM | POA: Diagnosis not present

## 2022-11-15 DIAGNOSIS — Z853 Personal history of malignant neoplasm of breast: Secondary | ICD-10-CM | POA: Diagnosis not present

## 2022-11-15 DIAGNOSIS — I428 Other cardiomyopathies: Secondary | ICD-10-CM | POA: Diagnosis not present

## 2022-11-15 DIAGNOSIS — R630 Anorexia: Secondary | ICD-10-CM | POA: Diagnosis not present

## 2022-11-15 DIAGNOSIS — J159 Unspecified bacterial pneumonia: Secondary | ICD-10-CM | POA: Diagnosis not present

## 2022-11-15 DIAGNOSIS — I5030 Unspecified diastolic (congestive) heart failure: Secondary | ICD-10-CM | POA: Diagnosis not present

## 2022-11-15 DIAGNOSIS — Z8541 Personal history of malignant neoplasm of cervix uteri: Secondary | ICD-10-CM | POA: Diagnosis not present

## 2022-11-15 DIAGNOSIS — Z8571 Personal history of Hodgkin lymphoma: Secondary | ICD-10-CM | POA: Diagnosis not present

## 2022-11-15 DIAGNOSIS — I130 Hypertensive heart and chronic kidney disease with heart failure and stage 1 through stage 4 chronic kidney disease, or unspecified chronic kidney disease: Secondary | ICD-10-CM | POA: Diagnosis not present

## 2022-11-15 DIAGNOSIS — Z8543 Personal history of malignant neoplasm of ovary: Secondary | ICD-10-CM | POA: Diagnosis not present

## 2022-11-15 DIAGNOSIS — G8929 Other chronic pain: Secondary | ICD-10-CM | POA: Diagnosis not present

## 2022-11-16 DIAGNOSIS — K209 Esophagitis, unspecified without bleeding: Secondary | ICD-10-CM | POA: Diagnosis not present

## 2022-11-16 DIAGNOSIS — K219 Gastro-esophageal reflux disease without esophagitis: Secondary | ICD-10-CM | POA: Diagnosis not present

## 2022-11-16 DIAGNOSIS — I428 Other cardiomyopathies: Secondary | ICD-10-CM | POA: Diagnosis not present

## 2022-11-16 DIAGNOSIS — G8929 Other chronic pain: Secondary | ICD-10-CM | POA: Diagnosis not present

## 2022-11-16 DIAGNOSIS — Z8571 Personal history of Hodgkin lymphoma: Secondary | ICD-10-CM | POA: Diagnosis not present

## 2022-11-16 DIAGNOSIS — E271 Primary adrenocortical insufficiency: Secondary | ICD-10-CM | POA: Diagnosis not present

## 2022-11-16 DIAGNOSIS — E8770 Fluid overload, unspecified: Secondary | ICD-10-CM | POA: Diagnosis not present

## 2022-11-16 DIAGNOSIS — N189 Chronic kidney disease, unspecified: Secondary | ICD-10-CM | POA: Diagnosis not present

## 2022-11-16 DIAGNOSIS — I130 Hypertensive heart and chronic kidney disease with heart failure and stage 1 through stage 4 chronic kidney disease, or unspecified chronic kidney disease: Secondary | ICD-10-CM | POA: Diagnosis not present

## 2022-11-16 DIAGNOSIS — R630 Anorexia: Secondary | ICD-10-CM | POA: Diagnosis not present

## 2022-11-16 DIAGNOSIS — Z8541 Personal history of malignant neoplasm of cervix uteri: Secondary | ICD-10-CM | POA: Diagnosis not present

## 2022-11-16 DIAGNOSIS — D352 Benign neoplasm of pituitary gland: Secondary | ICD-10-CM | POA: Diagnosis not present

## 2022-11-16 DIAGNOSIS — I5030 Unspecified diastolic (congestive) heart failure: Secondary | ICD-10-CM | POA: Diagnosis not present

## 2022-11-16 DIAGNOSIS — Z8543 Personal history of malignant neoplasm of ovary: Secondary | ICD-10-CM | POA: Diagnosis not present

## 2022-11-16 DIAGNOSIS — J159 Unspecified bacterial pneumonia: Secondary | ICD-10-CM | POA: Diagnosis not present

## 2022-11-16 DIAGNOSIS — Z853 Personal history of malignant neoplasm of breast: Secondary | ICD-10-CM | POA: Diagnosis not present

## 2022-11-17 DIAGNOSIS — Z8543 Personal history of malignant neoplasm of ovary: Secondary | ICD-10-CM | POA: Diagnosis not present

## 2022-11-17 DIAGNOSIS — Z8541 Personal history of malignant neoplasm of cervix uteri: Secondary | ICD-10-CM | POA: Diagnosis not present

## 2022-11-17 DIAGNOSIS — R630 Anorexia: Secondary | ICD-10-CM | POA: Diagnosis not present

## 2022-11-17 DIAGNOSIS — N189 Chronic kidney disease, unspecified: Secondary | ICD-10-CM | POA: Diagnosis not present

## 2022-11-17 DIAGNOSIS — I428 Other cardiomyopathies: Secondary | ICD-10-CM | POA: Diagnosis not present

## 2022-11-17 DIAGNOSIS — K209 Esophagitis, unspecified without bleeding: Secondary | ICD-10-CM | POA: Diagnosis not present

## 2022-11-17 DIAGNOSIS — E8770 Fluid overload, unspecified: Secondary | ICD-10-CM | POA: Diagnosis not present

## 2022-11-17 DIAGNOSIS — E271 Primary adrenocortical insufficiency: Secondary | ICD-10-CM | POA: Diagnosis not present

## 2022-11-17 DIAGNOSIS — D352 Benign neoplasm of pituitary gland: Secondary | ICD-10-CM | POA: Diagnosis not present

## 2022-11-17 DIAGNOSIS — J159 Unspecified bacterial pneumonia: Secondary | ICD-10-CM | POA: Diagnosis not present

## 2022-11-17 DIAGNOSIS — Z853 Personal history of malignant neoplasm of breast: Secondary | ICD-10-CM | POA: Diagnosis not present

## 2022-11-17 DIAGNOSIS — I130 Hypertensive heart and chronic kidney disease with heart failure and stage 1 through stage 4 chronic kidney disease, or unspecified chronic kidney disease: Secondary | ICD-10-CM | POA: Diagnosis not present

## 2022-11-17 DIAGNOSIS — G8929 Other chronic pain: Secondary | ICD-10-CM | POA: Diagnosis not present

## 2022-11-17 DIAGNOSIS — I5030 Unspecified diastolic (congestive) heart failure: Secondary | ICD-10-CM | POA: Diagnosis not present

## 2022-11-17 DIAGNOSIS — K219 Gastro-esophageal reflux disease without esophagitis: Secondary | ICD-10-CM | POA: Diagnosis not present

## 2022-11-17 DIAGNOSIS — Z8571 Personal history of Hodgkin lymphoma: Secondary | ICD-10-CM | POA: Diagnosis not present

## 2022-11-18 DIAGNOSIS — K219 Gastro-esophageal reflux disease without esophagitis: Secondary | ICD-10-CM | POA: Diagnosis not present

## 2022-11-18 DIAGNOSIS — E271 Primary adrenocortical insufficiency: Secondary | ICD-10-CM | POA: Diagnosis not present

## 2022-11-18 DIAGNOSIS — I428 Other cardiomyopathies: Secondary | ICD-10-CM | POA: Diagnosis not present

## 2022-11-18 DIAGNOSIS — K209 Esophagitis, unspecified without bleeding: Secondary | ICD-10-CM | POA: Diagnosis not present

## 2022-11-18 DIAGNOSIS — R630 Anorexia: Secondary | ICD-10-CM | POA: Diagnosis not present

## 2022-11-18 DIAGNOSIS — N189 Chronic kidney disease, unspecified: Secondary | ICD-10-CM | POA: Diagnosis not present

## 2022-11-18 DIAGNOSIS — G8929 Other chronic pain: Secondary | ICD-10-CM | POA: Diagnosis not present

## 2022-11-18 DIAGNOSIS — Z853 Personal history of malignant neoplasm of breast: Secondary | ICD-10-CM | POA: Diagnosis not present

## 2022-11-18 DIAGNOSIS — D352 Benign neoplasm of pituitary gland: Secondary | ICD-10-CM | POA: Diagnosis not present

## 2022-11-18 DIAGNOSIS — Z8571 Personal history of Hodgkin lymphoma: Secondary | ICD-10-CM | POA: Diagnosis not present

## 2022-11-18 DIAGNOSIS — E8770 Fluid overload, unspecified: Secondary | ICD-10-CM | POA: Diagnosis not present

## 2022-11-18 DIAGNOSIS — I130 Hypertensive heart and chronic kidney disease with heart failure and stage 1 through stage 4 chronic kidney disease, or unspecified chronic kidney disease: Secondary | ICD-10-CM | POA: Diagnosis not present

## 2022-11-18 DIAGNOSIS — Z8541 Personal history of malignant neoplasm of cervix uteri: Secondary | ICD-10-CM | POA: Diagnosis not present

## 2022-11-18 DIAGNOSIS — I5030 Unspecified diastolic (congestive) heart failure: Secondary | ICD-10-CM | POA: Diagnosis not present

## 2022-11-18 DIAGNOSIS — Z8543 Personal history of malignant neoplasm of ovary: Secondary | ICD-10-CM | POA: Diagnosis not present

## 2022-11-18 DIAGNOSIS — J159 Unspecified bacterial pneumonia: Secondary | ICD-10-CM | POA: Diagnosis not present

## 2022-11-19 DIAGNOSIS — G8929 Other chronic pain: Secondary | ICD-10-CM | POA: Diagnosis not present

## 2022-11-19 DIAGNOSIS — I5030 Unspecified diastolic (congestive) heart failure: Secondary | ICD-10-CM | POA: Diagnosis not present

## 2022-11-19 DIAGNOSIS — D352 Benign neoplasm of pituitary gland: Secondary | ICD-10-CM | POA: Diagnosis not present

## 2022-11-19 DIAGNOSIS — E8770 Fluid overload, unspecified: Secondary | ICD-10-CM | POA: Diagnosis not present

## 2022-11-19 DIAGNOSIS — J159 Unspecified bacterial pneumonia: Secondary | ICD-10-CM | POA: Diagnosis not present

## 2022-11-19 DIAGNOSIS — R630 Anorexia: Secondary | ICD-10-CM | POA: Diagnosis not present

## 2022-11-19 DIAGNOSIS — K219 Gastro-esophageal reflux disease without esophagitis: Secondary | ICD-10-CM | POA: Diagnosis not present

## 2022-11-19 DIAGNOSIS — K209 Esophagitis, unspecified without bleeding: Secondary | ICD-10-CM | POA: Diagnosis not present

## 2022-11-19 DIAGNOSIS — Z8571 Personal history of Hodgkin lymphoma: Secondary | ICD-10-CM | POA: Diagnosis not present

## 2022-11-19 DIAGNOSIS — Z853 Personal history of malignant neoplasm of breast: Secondary | ICD-10-CM | POA: Diagnosis not present

## 2022-11-19 DIAGNOSIS — I130 Hypertensive heart and chronic kidney disease with heart failure and stage 1 through stage 4 chronic kidney disease, or unspecified chronic kidney disease: Secondary | ICD-10-CM | POA: Diagnosis not present

## 2022-11-19 DIAGNOSIS — Z8541 Personal history of malignant neoplasm of cervix uteri: Secondary | ICD-10-CM | POA: Diagnosis not present

## 2022-11-19 DIAGNOSIS — N189 Chronic kidney disease, unspecified: Secondary | ICD-10-CM | POA: Diagnosis not present

## 2022-11-19 DIAGNOSIS — E271 Primary adrenocortical insufficiency: Secondary | ICD-10-CM | POA: Diagnosis not present

## 2022-11-19 DIAGNOSIS — Z8543 Personal history of malignant neoplasm of ovary: Secondary | ICD-10-CM | POA: Diagnosis not present

## 2022-11-19 DIAGNOSIS — I428 Other cardiomyopathies: Secondary | ICD-10-CM | POA: Diagnosis not present

## 2022-11-20 ENCOUNTER — Other Ambulatory Visit: Payer: Self-pay | Admitting: Cardiovascular Disease

## 2022-11-20 ENCOUNTER — Encounter: Payer: Self-pay | Admitting: Hematology and Oncology

## 2022-11-20 DIAGNOSIS — E271 Primary adrenocortical insufficiency: Secondary | ICD-10-CM | POA: Diagnosis not present

## 2022-11-20 DIAGNOSIS — Z8543 Personal history of malignant neoplasm of ovary: Secondary | ICD-10-CM | POA: Diagnosis not present

## 2022-11-20 DIAGNOSIS — I130 Hypertensive heart and chronic kidney disease with heart failure and stage 1 through stage 4 chronic kidney disease, or unspecified chronic kidney disease: Secondary | ICD-10-CM | POA: Diagnosis not present

## 2022-11-20 DIAGNOSIS — Z853 Personal history of malignant neoplasm of breast: Secondary | ICD-10-CM | POA: Diagnosis not present

## 2022-11-20 DIAGNOSIS — J159 Unspecified bacterial pneumonia: Secondary | ICD-10-CM | POA: Diagnosis not present

## 2022-11-20 DIAGNOSIS — I5030 Unspecified diastolic (congestive) heart failure: Secondary | ICD-10-CM | POA: Diagnosis not present

## 2022-11-20 DIAGNOSIS — N189 Chronic kidney disease, unspecified: Secondary | ICD-10-CM | POA: Diagnosis not present

## 2022-11-20 DIAGNOSIS — R630 Anorexia: Secondary | ICD-10-CM | POA: Diagnosis not present

## 2022-11-20 DIAGNOSIS — K219 Gastro-esophageal reflux disease without esophagitis: Secondary | ICD-10-CM | POA: Diagnosis not present

## 2022-11-20 DIAGNOSIS — I428 Other cardiomyopathies: Secondary | ICD-10-CM | POA: Diagnosis not present

## 2022-11-20 DIAGNOSIS — D352 Benign neoplasm of pituitary gland: Secondary | ICD-10-CM | POA: Diagnosis not present

## 2022-11-20 DIAGNOSIS — G8929 Other chronic pain: Secondary | ICD-10-CM | POA: Diagnosis not present

## 2022-11-20 DIAGNOSIS — E8770 Fluid overload, unspecified: Secondary | ICD-10-CM | POA: Diagnosis not present

## 2022-11-20 DIAGNOSIS — Z8541 Personal history of malignant neoplasm of cervix uteri: Secondary | ICD-10-CM | POA: Diagnosis not present

## 2022-11-20 DIAGNOSIS — Z8571 Personal history of Hodgkin lymphoma: Secondary | ICD-10-CM | POA: Diagnosis not present

## 2022-11-20 DIAGNOSIS — K209 Esophagitis, unspecified without bleeding: Secondary | ICD-10-CM | POA: Diagnosis not present

## 2022-11-21 DIAGNOSIS — Z8571 Personal history of Hodgkin lymphoma: Secondary | ICD-10-CM | POA: Diagnosis not present

## 2022-11-21 DIAGNOSIS — K219 Gastro-esophageal reflux disease without esophagitis: Secondary | ICD-10-CM | POA: Diagnosis not present

## 2022-11-21 DIAGNOSIS — K209 Esophagitis, unspecified without bleeding: Secondary | ICD-10-CM | POA: Diagnosis not present

## 2022-11-21 DIAGNOSIS — Z853 Personal history of malignant neoplasm of breast: Secondary | ICD-10-CM | POA: Diagnosis not present

## 2022-11-21 DIAGNOSIS — J159 Unspecified bacterial pneumonia: Secondary | ICD-10-CM | POA: Diagnosis not present

## 2022-11-21 DIAGNOSIS — I428 Other cardiomyopathies: Secondary | ICD-10-CM | POA: Diagnosis not present

## 2022-11-21 DIAGNOSIS — N189 Chronic kidney disease, unspecified: Secondary | ICD-10-CM | POA: Diagnosis not present

## 2022-11-21 DIAGNOSIS — Z8541 Personal history of malignant neoplasm of cervix uteri: Secondary | ICD-10-CM | POA: Diagnosis not present

## 2022-11-21 DIAGNOSIS — Z8543 Personal history of malignant neoplasm of ovary: Secondary | ICD-10-CM | POA: Diagnosis not present

## 2022-11-21 DIAGNOSIS — I5030 Unspecified diastolic (congestive) heart failure: Secondary | ICD-10-CM | POA: Diagnosis not present

## 2022-11-21 DIAGNOSIS — R630 Anorexia: Secondary | ICD-10-CM | POA: Diagnosis not present

## 2022-11-21 DIAGNOSIS — E8770 Fluid overload, unspecified: Secondary | ICD-10-CM | POA: Diagnosis not present

## 2022-11-21 DIAGNOSIS — E271 Primary adrenocortical insufficiency: Secondary | ICD-10-CM | POA: Diagnosis not present

## 2022-11-21 DIAGNOSIS — I130 Hypertensive heart and chronic kidney disease with heart failure and stage 1 through stage 4 chronic kidney disease, or unspecified chronic kidney disease: Secondary | ICD-10-CM | POA: Diagnosis not present

## 2022-11-21 DIAGNOSIS — D352 Benign neoplasm of pituitary gland: Secondary | ICD-10-CM | POA: Diagnosis not present

## 2022-11-21 DIAGNOSIS — G8929 Other chronic pain: Secondary | ICD-10-CM | POA: Diagnosis not present

## 2022-11-22 ENCOUNTER — Other Ambulatory Visit (HOSPITAL_COMMUNITY): Payer: Self-pay

## 2022-11-22 DIAGNOSIS — D352 Benign neoplasm of pituitary gland: Secondary | ICD-10-CM | POA: Diagnosis not present

## 2022-11-22 DIAGNOSIS — E890 Postprocedural hypothyroidism: Secondary | ICD-10-CM | POA: Diagnosis not present

## 2022-11-22 DIAGNOSIS — I428 Other cardiomyopathies: Secondary | ICD-10-CM | POA: Diagnosis not present

## 2022-11-22 DIAGNOSIS — I130 Hypertensive heart and chronic kidney disease with heart failure and stage 1 through stage 4 chronic kidney disease, or unspecified chronic kidney disease: Secondary | ICD-10-CM | POA: Diagnosis not present

## 2022-11-22 DIAGNOSIS — D443 Neoplasm of uncertain behavior of pituitary gland: Secondary | ICD-10-CM | POA: Diagnosis not present

## 2022-11-22 DIAGNOSIS — N189 Chronic kidney disease, unspecified: Secondary | ICD-10-CM | POA: Diagnosis not present

## 2022-11-22 DIAGNOSIS — R630 Anorexia: Secondary | ICD-10-CM | POA: Diagnosis not present

## 2022-11-22 DIAGNOSIS — Z853 Personal history of malignant neoplasm of breast: Secondary | ICD-10-CM | POA: Diagnosis not present

## 2022-11-22 DIAGNOSIS — E271 Primary adrenocortical insufficiency: Secondary | ICD-10-CM | POA: Diagnosis not present

## 2022-11-22 DIAGNOSIS — K209 Esophagitis, unspecified without bleeding: Secondary | ICD-10-CM | POA: Diagnosis not present

## 2022-11-22 DIAGNOSIS — E1169 Type 2 diabetes mellitus with other specified complication: Secondary | ICD-10-CM | POA: Diagnosis not present

## 2022-11-22 DIAGNOSIS — I5030 Unspecified diastolic (congestive) heart failure: Secondary | ICD-10-CM | POA: Diagnosis not present

## 2022-11-22 DIAGNOSIS — G8929 Other chronic pain: Secondary | ICD-10-CM | POA: Diagnosis not present

## 2022-11-22 DIAGNOSIS — Z8543 Personal history of malignant neoplasm of ovary: Secondary | ICD-10-CM | POA: Diagnosis not present

## 2022-11-22 DIAGNOSIS — Z8571 Personal history of Hodgkin lymphoma: Secondary | ICD-10-CM | POA: Diagnosis not present

## 2022-11-22 DIAGNOSIS — Z8541 Personal history of malignant neoplasm of cervix uteri: Secondary | ICD-10-CM | POA: Diagnosis not present

## 2022-11-22 DIAGNOSIS — E2740 Unspecified adrenocortical insufficiency: Secondary | ICD-10-CM | POA: Diagnosis not present

## 2022-11-22 DIAGNOSIS — E8770 Fluid overload, unspecified: Secondary | ICD-10-CM | POA: Diagnosis not present

## 2022-11-22 DIAGNOSIS — K219 Gastro-esophageal reflux disease without esophagitis: Secondary | ICD-10-CM | POA: Diagnosis not present

## 2022-11-22 DIAGNOSIS — J159 Unspecified bacterial pneumonia: Secondary | ICD-10-CM | POA: Diagnosis not present

## 2022-11-23 ENCOUNTER — Other Ambulatory Visit: Payer: Self-pay

## 2022-11-23 DIAGNOSIS — R630 Anorexia: Secondary | ICD-10-CM | POA: Diagnosis not present

## 2022-11-23 DIAGNOSIS — J159 Unspecified bacterial pneumonia: Secondary | ICD-10-CM | POA: Diagnosis not present

## 2022-11-23 DIAGNOSIS — E271 Primary adrenocortical insufficiency: Secondary | ICD-10-CM | POA: Diagnosis not present

## 2022-11-23 DIAGNOSIS — G8929 Other chronic pain: Secondary | ICD-10-CM | POA: Diagnosis not present

## 2022-11-23 DIAGNOSIS — I5030 Unspecified diastolic (congestive) heart failure: Secondary | ICD-10-CM | POA: Diagnosis not present

## 2022-11-23 DIAGNOSIS — I130 Hypertensive heart and chronic kidney disease with heart failure and stage 1 through stage 4 chronic kidney disease, or unspecified chronic kidney disease: Secondary | ICD-10-CM | POA: Diagnosis not present

## 2022-11-23 DIAGNOSIS — D352 Benign neoplasm of pituitary gland: Secondary | ICD-10-CM | POA: Diagnosis not present

## 2022-11-23 DIAGNOSIS — N189 Chronic kidney disease, unspecified: Secondary | ICD-10-CM | POA: Diagnosis not present

## 2022-11-23 DIAGNOSIS — Z8571 Personal history of Hodgkin lymphoma: Secondary | ICD-10-CM | POA: Diagnosis not present

## 2022-11-23 DIAGNOSIS — G43009 Migraine without aura, not intractable, without status migrainosus: Secondary | ICD-10-CM | POA: Diagnosis not present

## 2022-11-23 DIAGNOSIS — K219 Gastro-esophageal reflux disease without esophagitis: Secondary | ICD-10-CM | POA: Diagnosis not present

## 2022-11-23 DIAGNOSIS — Z8543 Personal history of malignant neoplasm of ovary: Secondary | ICD-10-CM | POA: Diagnosis not present

## 2022-11-23 DIAGNOSIS — Z8541 Personal history of malignant neoplasm of cervix uteri: Secondary | ICD-10-CM | POA: Diagnosis not present

## 2022-11-23 DIAGNOSIS — Z853 Personal history of malignant neoplasm of breast: Secondary | ICD-10-CM | POA: Diagnosis not present

## 2022-11-23 DIAGNOSIS — E8770 Fluid overload, unspecified: Secondary | ICD-10-CM | POA: Diagnosis not present

## 2022-11-23 DIAGNOSIS — K209 Esophagitis, unspecified without bleeding: Secondary | ICD-10-CM | POA: Diagnosis not present

## 2022-11-23 DIAGNOSIS — I428 Other cardiomyopathies: Secondary | ICD-10-CM | POA: Diagnosis not present

## 2022-11-24 DIAGNOSIS — Z853 Personal history of malignant neoplasm of breast: Secondary | ICD-10-CM | POA: Diagnosis not present

## 2022-11-24 DIAGNOSIS — K209 Esophagitis, unspecified without bleeding: Secondary | ICD-10-CM | POA: Diagnosis not present

## 2022-11-24 DIAGNOSIS — D352 Benign neoplasm of pituitary gland: Secondary | ICD-10-CM | POA: Diagnosis not present

## 2022-11-24 DIAGNOSIS — I5030 Unspecified diastolic (congestive) heart failure: Secondary | ICD-10-CM | POA: Diagnosis not present

## 2022-11-24 DIAGNOSIS — K219 Gastro-esophageal reflux disease without esophagitis: Secondary | ICD-10-CM | POA: Diagnosis not present

## 2022-11-24 DIAGNOSIS — E8770 Fluid overload, unspecified: Secondary | ICD-10-CM | POA: Diagnosis not present

## 2022-11-24 DIAGNOSIS — I130 Hypertensive heart and chronic kidney disease with heart failure and stage 1 through stage 4 chronic kidney disease, or unspecified chronic kidney disease: Secondary | ICD-10-CM | POA: Diagnosis not present

## 2022-11-24 DIAGNOSIS — Z8571 Personal history of Hodgkin lymphoma: Secondary | ICD-10-CM | POA: Diagnosis not present

## 2022-11-24 DIAGNOSIS — E271 Primary adrenocortical insufficiency: Secondary | ICD-10-CM | POA: Diagnosis not present

## 2022-11-24 DIAGNOSIS — J159 Unspecified bacterial pneumonia: Secondary | ICD-10-CM | POA: Diagnosis not present

## 2022-11-24 DIAGNOSIS — N189 Chronic kidney disease, unspecified: Secondary | ICD-10-CM | POA: Diagnosis not present

## 2022-11-24 DIAGNOSIS — G8929 Other chronic pain: Secondary | ICD-10-CM | POA: Diagnosis not present

## 2022-11-24 DIAGNOSIS — Z8543 Personal history of malignant neoplasm of ovary: Secondary | ICD-10-CM | POA: Diagnosis not present

## 2022-11-24 DIAGNOSIS — I428 Other cardiomyopathies: Secondary | ICD-10-CM | POA: Diagnosis not present

## 2022-11-24 DIAGNOSIS — Z8541 Personal history of malignant neoplasm of cervix uteri: Secondary | ICD-10-CM | POA: Diagnosis not present

## 2022-11-24 DIAGNOSIS — R630 Anorexia: Secondary | ICD-10-CM | POA: Diagnosis not present

## 2022-11-25 DIAGNOSIS — D352 Benign neoplasm of pituitary gland: Secondary | ICD-10-CM | POA: Diagnosis not present

## 2022-11-25 DIAGNOSIS — Z8543 Personal history of malignant neoplasm of ovary: Secondary | ICD-10-CM | POA: Diagnosis not present

## 2022-11-25 DIAGNOSIS — E271 Primary adrenocortical insufficiency: Secondary | ICD-10-CM | POA: Diagnosis not present

## 2022-11-25 DIAGNOSIS — Z853 Personal history of malignant neoplasm of breast: Secondary | ICD-10-CM | POA: Diagnosis not present

## 2022-11-25 DIAGNOSIS — I428 Other cardiomyopathies: Secondary | ICD-10-CM | POA: Diagnosis not present

## 2022-11-25 DIAGNOSIS — Z8541 Personal history of malignant neoplasm of cervix uteri: Secondary | ICD-10-CM | POA: Diagnosis not present

## 2022-11-25 DIAGNOSIS — I5030 Unspecified diastolic (congestive) heart failure: Secondary | ICD-10-CM | POA: Diagnosis not present

## 2022-11-25 DIAGNOSIS — R630 Anorexia: Secondary | ICD-10-CM | POA: Diagnosis not present

## 2022-11-25 DIAGNOSIS — N189 Chronic kidney disease, unspecified: Secondary | ICD-10-CM | POA: Diagnosis not present

## 2022-11-25 DIAGNOSIS — K209 Esophagitis, unspecified without bleeding: Secondary | ICD-10-CM | POA: Diagnosis not present

## 2022-11-25 DIAGNOSIS — G8929 Other chronic pain: Secondary | ICD-10-CM | POA: Diagnosis not present

## 2022-11-25 DIAGNOSIS — Z8571 Personal history of Hodgkin lymphoma: Secondary | ICD-10-CM | POA: Diagnosis not present

## 2022-11-25 DIAGNOSIS — K219 Gastro-esophageal reflux disease without esophagitis: Secondary | ICD-10-CM | POA: Diagnosis not present

## 2022-11-25 DIAGNOSIS — E8770 Fluid overload, unspecified: Secondary | ICD-10-CM | POA: Diagnosis not present

## 2022-11-25 DIAGNOSIS — J159 Unspecified bacterial pneumonia: Secondary | ICD-10-CM | POA: Diagnosis not present

## 2022-11-25 DIAGNOSIS — I130 Hypertensive heart and chronic kidney disease with heart failure and stage 1 through stage 4 chronic kidney disease, or unspecified chronic kidney disease: Secondary | ICD-10-CM | POA: Diagnosis not present

## 2022-11-26 ENCOUNTER — Other Ambulatory Visit: Payer: Self-pay | Admitting: Adult Health

## 2022-11-26 DIAGNOSIS — F331 Major depressive disorder, recurrent, moderate: Secondary | ICD-10-CM

## 2022-11-26 DIAGNOSIS — D352 Benign neoplasm of pituitary gland: Secondary | ICD-10-CM | POA: Diagnosis not present

## 2022-11-26 DIAGNOSIS — R630 Anorexia: Secondary | ICD-10-CM | POA: Diagnosis not present

## 2022-11-26 DIAGNOSIS — I130 Hypertensive heart and chronic kidney disease with heart failure and stage 1 through stage 4 chronic kidney disease, or unspecified chronic kidney disease: Secondary | ICD-10-CM | POA: Diagnosis not present

## 2022-11-26 DIAGNOSIS — I5030 Unspecified diastolic (congestive) heart failure: Secondary | ICD-10-CM | POA: Diagnosis not present

## 2022-11-26 DIAGNOSIS — K219 Gastro-esophageal reflux disease without esophagitis: Secondary | ICD-10-CM | POA: Diagnosis not present

## 2022-11-26 DIAGNOSIS — Z8571 Personal history of Hodgkin lymphoma: Secondary | ICD-10-CM | POA: Diagnosis not present

## 2022-11-26 DIAGNOSIS — Z8541 Personal history of malignant neoplasm of cervix uteri: Secondary | ICD-10-CM | POA: Diagnosis not present

## 2022-11-26 DIAGNOSIS — E271 Primary adrenocortical insufficiency: Secondary | ICD-10-CM | POA: Diagnosis not present

## 2022-11-26 DIAGNOSIS — N189 Chronic kidney disease, unspecified: Secondary | ICD-10-CM | POA: Diagnosis not present

## 2022-11-26 DIAGNOSIS — J159 Unspecified bacterial pneumonia: Secondary | ICD-10-CM | POA: Diagnosis not present

## 2022-11-26 DIAGNOSIS — Z8543 Personal history of malignant neoplasm of ovary: Secondary | ICD-10-CM | POA: Diagnosis not present

## 2022-11-26 DIAGNOSIS — Z853 Personal history of malignant neoplasm of breast: Secondary | ICD-10-CM | POA: Diagnosis not present

## 2022-11-26 DIAGNOSIS — I428 Other cardiomyopathies: Secondary | ICD-10-CM | POA: Diagnosis not present

## 2022-11-26 DIAGNOSIS — E8770 Fluid overload, unspecified: Secondary | ICD-10-CM | POA: Diagnosis not present

## 2022-11-26 DIAGNOSIS — G8929 Other chronic pain: Secondary | ICD-10-CM | POA: Diagnosis not present

## 2022-11-26 DIAGNOSIS — K209 Esophagitis, unspecified without bleeding: Secondary | ICD-10-CM | POA: Diagnosis not present

## 2022-11-27 DIAGNOSIS — I428 Other cardiomyopathies: Secondary | ICD-10-CM | POA: Diagnosis not present

## 2022-11-27 DIAGNOSIS — Z8541 Personal history of malignant neoplasm of cervix uteri: Secondary | ICD-10-CM | POA: Diagnosis not present

## 2022-11-27 DIAGNOSIS — K209 Esophagitis, unspecified without bleeding: Secondary | ICD-10-CM | POA: Diagnosis not present

## 2022-11-27 DIAGNOSIS — J159 Unspecified bacterial pneumonia: Secondary | ICD-10-CM | POA: Diagnosis not present

## 2022-11-27 DIAGNOSIS — G8929 Other chronic pain: Secondary | ICD-10-CM | POA: Diagnosis not present

## 2022-11-27 DIAGNOSIS — E8770 Fluid overload, unspecified: Secondary | ICD-10-CM | POA: Diagnosis not present

## 2022-11-27 DIAGNOSIS — Z8571 Personal history of Hodgkin lymphoma: Secondary | ICD-10-CM | POA: Diagnosis not present

## 2022-11-27 DIAGNOSIS — Z8543 Personal history of malignant neoplasm of ovary: Secondary | ICD-10-CM | POA: Diagnosis not present

## 2022-11-27 DIAGNOSIS — I5030 Unspecified diastolic (congestive) heart failure: Secondary | ICD-10-CM | POA: Diagnosis not present

## 2022-11-27 DIAGNOSIS — E271 Primary adrenocortical insufficiency: Secondary | ICD-10-CM | POA: Diagnosis not present

## 2022-11-27 DIAGNOSIS — N189 Chronic kidney disease, unspecified: Secondary | ICD-10-CM | POA: Diagnosis not present

## 2022-11-27 DIAGNOSIS — D352 Benign neoplasm of pituitary gland: Secondary | ICD-10-CM | POA: Diagnosis not present

## 2022-11-27 DIAGNOSIS — Z853 Personal history of malignant neoplasm of breast: Secondary | ICD-10-CM | POA: Diagnosis not present

## 2022-11-27 DIAGNOSIS — K219 Gastro-esophageal reflux disease without esophagitis: Secondary | ICD-10-CM | POA: Diagnosis not present

## 2022-11-27 DIAGNOSIS — R630 Anorexia: Secondary | ICD-10-CM | POA: Diagnosis not present

## 2022-11-27 DIAGNOSIS — I130 Hypertensive heart and chronic kidney disease with heart failure and stage 1 through stage 4 chronic kidney disease, or unspecified chronic kidney disease: Secondary | ICD-10-CM | POA: Diagnosis not present

## 2022-11-28 DIAGNOSIS — I130 Hypertensive heart and chronic kidney disease with heart failure and stage 1 through stage 4 chronic kidney disease, or unspecified chronic kidney disease: Secondary | ICD-10-CM | POA: Diagnosis not present

## 2022-11-28 DIAGNOSIS — N189 Chronic kidney disease, unspecified: Secondary | ICD-10-CM | POA: Diagnosis not present

## 2022-11-28 DIAGNOSIS — G8929 Other chronic pain: Secondary | ICD-10-CM | POA: Diagnosis not present

## 2022-11-28 DIAGNOSIS — D352 Benign neoplasm of pituitary gland: Secondary | ICD-10-CM | POA: Diagnosis not present

## 2022-11-28 DIAGNOSIS — E271 Primary adrenocortical insufficiency: Secondary | ICD-10-CM | POA: Diagnosis not present

## 2022-11-28 DIAGNOSIS — R911 Solitary pulmonary nodule: Secondary | ICD-10-CM | POA: Diagnosis not present

## 2022-11-28 DIAGNOSIS — E8770 Fluid overload, unspecified: Secondary | ICD-10-CM | POA: Diagnosis not present

## 2022-11-28 DIAGNOSIS — I428 Other cardiomyopathies: Secondary | ICD-10-CM | POA: Diagnosis not present

## 2022-11-28 DIAGNOSIS — J159 Unspecified bacterial pneumonia: Secondary | ICD-10-CM | POA: Diagnosis not present

## 2022-11-28 DIAGNOSIS — K209 Esophagitis, unspecified without bleeding: Secondary | ICD-10-CM | POA: Diagnosis not present

## 2022-11-28 DIAGNOSIS — Z8571 Personal history of Hodgkin lymphoma: Secondary | ICD-10-CM | POA: Diagnosis not present

## 2022-11-28 DIAGNOSIS — I5030 Unspecified diastolic (congestive) heart failure: Secondary | ICD-10-CM | POA: Diagnosis not present

## 2022-11-28 DIAGNOSIS — R630 Anorexia: Secondary | ICD-10-CM | POA: Diagnosis not present

## 2022-11-28 DIAGNOSIS — Z8543 Personal history of malignant neoplasm of ovary: Secondary | ICD-10-CM | POA: Diagnosis not present

## 2022-11-28 DIAGNOSIS — Z8541 Personal history of malignant neoplasm of cervix uteri: Secondary | ICD-10-CM | POA: Diagnosis not present

## 2022-11-28 DIAGNOSIS — Z853 Personal history of malignant neoplasm of breast: Secondary | ICD-10-CM | POA: Diagnosis not present

## 2022-11-28 DIAGNOSIS — K219 Gastro-esophageal reflux disease without esophagitis: Secondary | ICD-10-CM | POA: Diagnosis not present

## 2022-11-29 DIAGNOSIS — E271 Primary adrenocortical insufficiency: Secondary | ICD-10-CM | POA: Diagnosis not present

## 2022-11-29 DIAGNOSIS — Z8541 Personal history of malignant neoplasm of cervix uteri: Secondary | ICD-10-CM | POA: Diagnosis not present

## 2022-11-29 DIAGNOSIS — R630 Anorexia: Secondary | ICD-10-CM | POA: Diagnosis not present

## 2022-11-29 DIAGNOSIS — I130 Hypertensive heart and chronic kidney disease with heart failure and stage 1 through stage 4 chronic kidney disease, or unspecified chronic kidney disease: Secondary | ICD-10-CM | POA: Diagnosis not present

## 2022-11-29 DIAGNOSIS — J159 Unspecified bacterial pneumonia: Secondary | ICD-10-CM | POA: Diagnosis not present

## 2022-11-29 DIAGNOSIS — K219 Gastro-esophageal reflux disease without esophagitis: Secondary | ICD-10-CM | POA: Diagnosis not present

## 2022-11-29 DIAGNOSIS — G8929 Other chronic pain: Secondary | ICD-10-CM | POA: Diagnosis not present

## 2022-11-29 DIAGNOSIS — Z8543 Personal history of malignant neoplasm of ovary: Secondary | ICD-10-CM | POA: Diagnosis not present

## 2022-11-29 DIAGNOSIS — D352 Benign neoplasm of pituitary gland: Secondary | ICD-10-CM | POA: Diagnosis not present

## 2022-11-29 DIAGNOSIS — Z853 Personal history of malignant neoplasm of breast: Secondary | ICD-10-CM | POA: Diagnosis not present

## 2022-11-29 DIAGNOSIS — I428 Other cardiomyopathies: Secondary | ICD-10-CM | POA: Diagnosis not present

## 2022-11-29 DIAGNOSIS — K209 Esophagitis, unspecified without bleeding: Secondary | ICD-10-CM | POA: Diagnosis not present

## 2022-11-29 DIAGNOSIS — I5030 Unspecified diastolic (congestive) heart failure: Secondary | ICD-10-CM | POA: Diagnosis not present

## 2022-11-29 DIAGNOSIS — Z8571 Personal history of Hodgkin lymphoma: Secondary | ICD-10-CM | POA: Diagnosis not present

## 2022-11-29 DIAGNOSIS — N189 Chronic kidney disease, unspecified: Secondary | ICD-10-CM | POA: Diagnosis not present

## 2022-11-29 DIAGNOSIS — E8770 Fluid overload, unspecified: Secondary | ICD-10-CM | POA: Diagnosis not present

## 2022-11-30 ENCOUNTER — Other Ambulatory Visit (HOSPITAL_COMMUNITY): Payer: Self-pay

## 2022-11-30 DIAGNOSIS — E8770 Fluid overload, unspecified: Secondary | ICD-10-CM | POA: Diagnosis not present

## 2022-11-30 DIAGNOSIS — J159 Unspecified bacterial pneumonia: Secondary | ICD-10-CM | POA: Diagnosis not present

## 2022-11-30 DIAGNOSIS — K219 Gastro-esophageal reflux disease without esophagitis: Secondary | ICD-10-CM | POA: Diagnosis not present

## 2022-11-30 DIAGNOSIS — R630 Anorexia: Secondary | ICD-10-CM | POA: Diagnosis not present

## 2022-11-30 DIAGNOSIS — G8929 Other chronic pain: Secondary | ICD-10-CM | POA: Diagnosis not present

## 2022-11-30 DIAGNOSIS — Z8543 Personal history of malignant neoplasm of ovary: Secondary | ICD-10-CM | POA: Diagnosis not present

## 2022-11-30 DIAGNOSIS — I130 Hypertensive heart and chronic kidney disease with heart failure and stage 1 through stage 4 chronic kidney disease, or unspecified chronic kidney disease: Secondary | ICD-10-CM | POA: Diagnosis not present

## 2022-11-30 DIAGNOSIS — D352 Benign neoplasm of pituitary gland: Secondary | ICD-10-CM | POA: Diagnosis not present

## 2022-11-30 DIAGNOSIS — Z853 Personal history of malignant neoplasm of breast: Secondary | ICD-10-CM | POA: Diagnosis not present

## 2022-11-30 DIAGNOSIS — E271 Primary adrenocortical insufficiency: Secondary | ICD-10-CM | POA: Diagnosis not present

## 2022-11-30 DIAGNOSIS — Z8571 Personal history of Hodgkin lymphoma: Secondary | ICD-10-CM | POA: Diagnosis not present

## 2022-11-30 DIAGNOSIS — I428 Other cardiomyopathies: Secondary | ICD-10-CM | POA: Diagnosis not present

## 2022-11-30 DIAGNOSIS — I5030 Unspecified diastolic (congestive) heart failure: Secondary | ICD-10-CM | POA: Diagnosis not present

## 2022-11-30 DIAGNOSIS — N189 Chronic kidney disease, unspecified: Secondary | ICD-10-CM | POA: Diagnosis not present

## 2022-11-30 DIAGNOSIS — Z8541 Personal history of malignant neoplasm of cervix uteri: Secondary | ICD-10-CM | POA: Diagnosis not present

## 2022-11-30 DIAGNOSIS — K209 Esophagitis, unspecified without bleeding: Secondary | ICD-10-CM | POA: Diagnosis not present

## 2022-12-01 DIAGNOSIS — I5030 Unspecified diastolic (congestive) heart failure: Secondary | ICD-10-CM | POA: Diagnosis not present

## 2022-12-01 DIAGNOSIS — E271 Primary adrenocortical insufficiency: Secondary | ICD-10-CM | POA: Diagnosis not present

## 2022-12-01 DIAGNOSIS — Z8571 Personal history of Hodgkin lymphoma: Secondary | ICD-10-CM | POA: Diagnosis not present

## 2022-12-01 DIAGNOSIS — N189 Chronic kidney disease, unspecified: Secondary | ICD-10-CM | POA: Diagnosis not present

## 2022-12-01 DIAGNOSIS — K209 Esophagitis, unspecified without bleeding: Secondary | ICD-10-CM | POA: Diagnosis not present

## 2022-12-01 DIAGNOSIS — Z853 Personal history of malignant neoplasm of breast: Secondary | ICD-10-CM | POA: Diagnosis not present

## 2022-12-01 DIAGNOSIS — D352 Benign neoplasm of pituitary gland: Secondary | ICD-10-CM | POA: Diagnosis not present

## 2022-12-01 DIAGNOSIS — I130 Hypertensive heart and chronic kidney disease with heart failure and stage 1 through stage 4 chronic kidney disease, or unspecified chronic kidney disease: Secondary | ICD-10-CM | POA: Diagnosis not present

## 2022-12-01 DIAGNOSIS — J159 Unspecified bacterial pneumonia: Secondary | ICD-10-CM | POA: Diagnosis not present

## 2022-12-01 DIAGNOSIS — G8929 Other chronic pain: Secondary | ICD-10-CM | POA: Diagnosis not present

## 2022-12-01 DIAGNOSIS — Z8541 Personal history of malignant neoplasm of cervix uteri: Secondary | ICD-10-CM | POA: Diagnosis not present

## 2022-12-01 DIAGNOSIS — K219 Gastro-esophageal reflux disease without esophagitis: Secondary | ICD-10-CM | POA: Diagnosis not present

## 2022-12-01 DIAGNOSIS — E8770 Fluid overload, unspecified: Secondary | ICD-10-CM | POA: Diagnosis not present

## 2022-12-01 DIAGNOSIS — I428 Other cardiomyopathies: Secondary | ICD-10-CM | POA: Diagnosis not present

## 2022-12-01 DIAGNOSIS — Z8543 Personal history of malignant neoplasm of ovary: Secondary | ICD-10-CM | POA: Diagnosis not present

## 2022-12-01 DIAGNOSIS — R630 Anorexia: Secondary | ICD-10-CM | POA: Diagnosis not present

## 2022-12-02 DIAGNOSIS — E271 Primary adrenocortical insufficiency: Secondary | ICD-10-CM | POA: Diagnosis not present

## 2022-12-02 DIAGNOSIS — Z8541 Personal history of malignant neoplasm of cervix uteri: Secondary | ICD-10-CM | POA: Diagnosis not present

## 2022-12-02 DIAGNOSIS — D352 Benign neoplasm of pituitary gland: Secondary | ICD-10-CM | POA: Diagnosis not present

## 2022-12-02 DIAGNOSIS — I130 Hypertensive heart and chronic kidney disease with heart failure and stage 1 through stage 4 chronic kidney disease, or unspecified chronic kidney disease: Secondary | ICD-10-CM | POA: Diagnosis not present

## 2022-12-02 DIAGNOSIS — N189 Chronic kidney disease, unspecified: Secondary | ICD-10-CM | POA: Diagnosis not present

## 2022-12-02 DIAGNOSIS — I5030 Unspecified diastolic (congestive) heart failure: Secondary | ICD-10-CM | POA: Diagnosis not present

## 2022-12-02 DIAGNOSIS — E8770 Fluid overload, unspecified: Secondary | ICD-10-CM | POA: Diagnosis not present

## 2022-12-02 DIAGNOSIS — Z8571 Personal history of Hodgkin lymphoma: Secondary | ICD-10-CM | POA: Diagnosis not present

## 2022-12-02 DIAGNOSIS — Z853 Personal history of malignant neoplasm of breast: Secondary | ICD-10-CM | POA: Diagnosis not present

## 2022-12-02 DIAGNOSIS — Z8543 Personal history of malignant neoplasm of ovary: Secondary | ICD-10-CM | POA: Diagnosis not present

## 2022-12-02 DIAGNOSIS — K219 Gastro-esophageal reflux disease without esophagitis: Secondary | ICD-10-CM | POA: Diagnosis not present

## 2022-12-02 DIAGNOSIS — R630 Anorexia: Secondary | ICD-10-CM | POA: Diagnosis not present

## 2022-12-02 DIAGNOSIS — G8929 Other chronic pain: Secondary | ICD-10-CM | POA: Diagnosis not present

## 2022-12-02 DIAGNOSIS — I428 Other cardiomyopathies: Secondary | ICD-10-CM | POA: Diagnosis not present

## 2022-12-02 DIAGNOSIS — J159 Unspecified bacterial pneumonia: Secondary | ICD-10-CM | POA: Diagnosis not present

## 2022-12-02 DIAGNOSIS — K209 Esophagitis, unspecified without bleeding: Secondary | ICD-10-CM | POA: Diagnosis not present

## 2022-12-03 DIAGNOSIS — Z853 Personal history of malignant neoplasm of breast: Secondary | ICD-10-CM | POA: Diagnosis not present

## 2022-12-03 DIAGNOSIS — D352 Benign neoplasm of pituitary gland: Secondary | ICD-10-CM | POA: Diagnosis not present

## 2022-12-03 DIAGNOSIS — R630 Anorexia: Secondary | ICD-10-CM | POA: Diagnosis not present

## 2022-12-03 DIAGNOSIS — I130 Hypertensive heart and chronic kidney disease with heart failure and stage 1 through stage 4 chronic kidney disease, or unspecified chronic kidney disease: Secondary | ICD-10-CM | POA: Diagnosis not present

## 2022-12-03 DIAGNOSIS — N189 Chronic kidney disease, unspecified: Secondary | ICD-10-CM | POA: Diagnosis not present

## 2022-12-03 DIAGNOSIS — Z8541 Personal history of malignant neoplasm of cervix uteri: Secondary | ICD-10-CM | POA: Diagnosis not present

## 2022-12-03 DIAGNOSIS — I428 Other cardiomyopathies: Secondary | ICD-10-CM | POA: Diagnosis not present

## 2022-12-03 DIAGNOSIS — K219 Gastro-esophageal reflux disease without esophagitis: Secondary | ICD-10-CM | POA: Diagnosis not present

## 2022-12-03 DIAGNOSIS — I5030 Unspecified diastolic (congestive) heart failure: Secondary | ICD-10-CM | POA: Diagnosis not present

## 2022-12-03 DIAGNOSIS — Z8543 Personal history of malignant neoplasm of ovary: Secondary | ICD-10-CM | POA: Diagnosis not present

## 2022-12-03 DIAGNOSIS — E8770 Fluid overload, unspecified: Secondary | ICD-10-CM | POA: Diagnosis not present

## 2022-12-03 DIAGNOSIS — E271 Primary adrenocortical insufficiency: Secondary | ICD-10-CM | POA: Diagnosis not present

## 2022-12-03 DIAGNOSIS — Z8571 Personal history of Hodgkin lymphoma: Secondary | ICD-10-CM | POA: Diagnosis not present

## 2022-12-03 DIAGNOSIS — J159 Unspecified bacterial pneumonia: Secondary | ICD-10-CM | POA: Diagnosis not present

## 2022-12-03 DIAGNOSIS — G8929 Other chronic pain: Secondary | ICD-10-CM | POA: Diagnosis not present

## 2022-12-03 DIAGNOSIS — K209 Esophagitis, unspecified without bleeding: Secondary | ICD-10-CM | POA: Diagnosis not present

## 2022-12-04 DIAGNOSIS — G8929 Other chronic pain: Secondary | ICD-10-CM | POA: Diagnosis not present

## 2022-12-04 DIAGNOSIS — I5030 Unspecified diastolic (congestive) heart failure: Secondary | ICD-10-CM | POA: Diagnosis not present

## 2022-12-04 DIAGNOSIS — Z8541 Personal history of malignant neoplasm of cervix uteri: Secondary | ICD-10-CM | POA: Diagnosis not present

## 2022-12-04 DIAGNOSIS — Z8571 Personal history of Hodgkin lymphoma: Secondary | ICD-10-CM | POA: Diagnosis not present

## 2022-12-04 DIAGNOSIS — N189 Chronic kidney disease, unspecified: Secondary | ICD-10-CM | POA: Diagnosis not present

## 2022-12-04 DIAGNOSIS — I428 Other cardiomyopathies: Secondary | ICD-10-CM | POA: Diagnosis not present

## 2022-12-04 DIAGNOSIS — R630 Anorexia: Secondary | ICD-10-CM | POA: Diagnosis not present

## 2022-12-04 DIAGNOSIS — I130 Hypertensive heart and chronic kidney disease with heart failure and stage 1 through stage 4 chronic kidney disease, or unspecified chronic kidney disease: Secondary | ICD-10-CM | POA: Diagnosis not present

## 2022-12-04 DIAGNOSIS — D352 Benign neoplasm of pituitary gland: Secondary | ICD-10-CM | POA: Diagnosis not present

## 2022-12-04 DIAGNOSIS — J159 Unspecified bacterial pneumonia: Secondary | ICD-10-CM | POA: Diagnosis not present

## 2022-12-04 DIAGNOSIS — Z853 Personal history of malignant neoplasm of breast: Secondary | ICD-10-CM | POA: Diagnosis not present

## 2022-12-04 DIAGNOSIS — Z8543 Personal history of malignant neoplasm of ovary: Secondary | ICD-10-CM | POA: Diagnosis not present

## 2022-12-04 DIAGNOSIS — K219 Gastro-esophageal reflux disease without esophagitis: Secondary | ICD-10-CM | POA: Diagnosis not present

## 2022-12-04 DIAGNOSIS — E8770 Fluid overload, unspecified: Secondary | ICD-10-CM | POA: Diagnosis not present

## 2022-12-04 DIAGNOSIS — K209 Esophagitis, unspecified without bleeding: Secondary | ICD-10-CM | POA: Diagnosis not present

## 2022-12-04 DIAGNOSIS — E271 Primary adrenocortical insufficiency: Secondary | ICD-10-CM | POA: Diagnosis not present

## 2022-12-05 DIAGNOSIS — G8929 Other chronic pain: Secondary | ICD-10-CM | POA: Diagnosis not present

## 2022-12-05 DIAGNOSIS — K219 Gastro-esophageal reflux disease without esophagitis: Secondary | ICD-10-CM | POA: Diagnosis not present

## 2022-12-05 DIAGNOSIS — I5030 Unspecified diastolic (congestive) heart failure: Secondary | ICD-10-CM | POA: Diagnosis not present

## 2022-12-05 DIAGNOSIS — E8770 Fluid overload, unspecified: Secondary | ICD-10-CM | POA: Diagnosis not present

## 2022-12-05 DIAGNOSIS — I428 Other cardiomyopathies: Secondary | ICD-10-CM | POA: Diagnosis not present

## 2022-12-05 DIAGNOSIS — K209 Esophagitis, unspecified without bleeding: Secondary | ICD-10-CM | POA: Diagnosis not present

## 2022-12-05 DIAGNOSIS — N189 Chronic kidney disease, unspecified: Secondary | ICD-10-CM | POA: Diagnosis not present

## 2022-12-05 DIAGNOSIS — Z8571 Personal history of Hodgkin lymphoma: Secondary | ICD-10-CM | POA: Diagnosis not present

## 2022-12-05 DIAGNOSIS — R630 Anorexia: Secondary | ICD-10-CM | POA: Diagnosis not present

## 2022-12-05 DIAGNOSIS — Z853 Personal history of malignant neoplasm of breast: Secondary | ICD-10-CM | POA: Diagnosis not present

## 2022-12-05 DIAGNOSIS — E271 Primary adrenocortical insufficiency: Secondary | ICD-10-CM | POA: Diagnosis not present

## 2022-12-05 DIAGNOSIS — J159 Unspecified bacterial pneumonia: Secondary | ICD-10-CM | POA: Diagnosis not present

## 2022-12-05 DIAGNOSIS — Z8543 Personal history of malignant neoplasm of ovary: Secondary | ICD-10-CM | POA: Diagnosis not present

## 2022-12-05 DIAGNOSIS — D352 Benign neoplasm of pituitary gland: Secondary | ICD-10-CM | POA: Diagnosis not present

## 2022-12-05 DIAGNOSIS — I130 Hypertensive heart and chronic kidney disease with heart failure and stage 1 through stage 4 chronic kidney disease, or unspecified chronic kidney disease: Secondary | ICD-10-CM | POA: Diagnosis not present

## 2022-12-05 DIAGNOSIS — Z8541 Personal history of malignant neoplasm of cervix uteri: Secondary | ICD-10-CM | POA: Diagnosis not present

## 2022-12-06 DIAGNOSIS — K209 Esophagitis, unspecified without bleeding: Secondary | ICD-10-CM | POA: Diagnosis not present

## 2022-12-06 DIAGNOSIS — D352 Benign neoplasm of pituitary gland: Secondary | ICD-10-CM | POA: Diagnosis not present

## 2022-12-06 DIAGNOSIS — Z853 Personal history of malignant neoplasm of breast: Secondary | ICD-10-CM | POA: Diagnosis not present

## 2022-12-06 DIAGNOSIS — E8770 Fluid overload, unspecified: Secondary | ICD-10-CM | POA: Diagnosis not present

## 2022-12-06 DIAGNOSIS — J159 Unspecified bacterial pneumonia: Secondary | ICD-10-CM | POA: Diagnosis not present

## 2022-12-06 DIAGNOSIS — G8929 Other chronic pain: Secondary | ICD-10-CM | POA: Diagnosis not present

## 2022-12-06 DIAGNOSIS — I5030 Unspecified diastolic (congestive) heart failure: Secondary | ICD-10-CM | POA: Diagnosis not present

## 2022-12-06 DIAGNOSIS — I130 Hypertensive heart and chronic kidney disease with heart failure and stage 1 through stage 4 chronic kidney disease, or unspecified chronic kidney disease: Secondary | ICD-10-CM | POA: Diagnosis not present

## 2022-12-06 DIAGNOSIS — Z8541 Personal history of malignant neoplasm of cervix uteri: Secondary | ICD-10-CM | POA: Diagnosis not present

## 2022-12-06 DIAGNOSIS — Z8543 Personal history of malignant neoplasm of ovary: Secondary | ICD-10-CM | POA: Diagnosis not present

## 2022-12-06 DIAGNOSIS — I428 Other cardiomyopathies: Secondary | ICD-10-CM | POA: Diagnosis not present

## 2022-12-06 DIAGNOSIS — N189 Chronic kidney disease, unspecified: Secondary | ICD-10-CM | POA: Diagnosis not present

## 2022-12-06 DIAGNOSIS — E271 Primary adrenocortical insufficiency: Secondary | ICD-10-CM | POA: Diagnosis not present

## 2022-12-06 DIAGNOSIS — Z8571 Personal history of Hodgkin lymphoma: Secondary | ICD-10-CM | POA: Diagnosis not present

## 2022-12-06 DIAGNOSIS — K219 Gastro-esophageal reflux disease without esophagitis: Secondary | ICD-10-CM | POA: Diagnosis not present

## 2022-12-06 DIAGNOSIS — R630 Anorexia: Secondary | ICD-10-CM | POA: Diagnosis not present

## 2022-12-07 DIAGNOSIS — Z853 Personal history of malignant neoplasm of breast: Secondary | ICD-10-CM | POA: Diagnosis not present

## 2022-12-07 DIAGNOSIS — E271 Primary adrenocortical insufficiency: Secondary | ICD-10-CM | POA: Diagnosis not present

## 2022-12-07 DIAGNOSIS — D352 Benign neoplasm of pituitary gland: Secondary | ICD-10-CM | POA: Diagnosis not present

## 2022-12-07 DIAGNOSIS — I130 Hypertensive heart and chronic kidney disease with heart failure and stage 1 through stage 4 chronic kidney disease, or unspecified chronic kidney disease: Secondary | ICD-10-CM | POA: Diagnosis not present

## 2022-12-07 DIAGNOSIS — K219 Gastro-esophageal reflux disease without esophagitis: Secondary | ICD-10-CM | POA: Diagnosis not present

## 2022-12-07 DIAGNOSIS — I5030 Unspecified diastolic (congestive) heart failure: Secondary | ICD-10-CM | POA: Diagnosis not present

## 2022-12-07 DIAGNOSIS — J159 Unspecified bacterial pneumonia: Secondary | ICD-10-CM | POA: Diagnosis not present

## 2022-12-07 DIAGNOSIS — R630 Anorexia: Secondary | ICD-10-CM | POA: Diagnosis not present

## 2022-12-07 DIAGNOSIS — G8929 Other chronic pain: Secondary | ICD-10-CM | POA: Diagnosis not present

## 2022-12-07 DIAGNOSIS — Z8571 Personal history of Hodgkin lymphoma: Secondary | ICD-10-CM | POA: Diagnosis not present

## 2022-12-07 DIAGNOSIS — Z8541 Personal history of malignant neoplasm of cervix uteri: Secondary | ICD-10-CM | POA: Diagnosis not present

## 2022-12-07 DIAGNOSIS — N189 Chronic kidney disease, unspecified: Secondary | ICD-10-CM | POA: Diagnosis not present

## 2022-12-07 DIAGNOSIS — Z8543 Personal history of malignant neoplasm of ovary: Secondary | ICD-10-CM | POA: Diagnosis not present

## 2022-12-07 DIAGNOSIS — K209 Esophagitis, unspecified without bleeding: Secondary | ICD-10-CM | POA: Diagnosis not present

## 2022-12-07 DIAGNOSIS — I428 Other cardiomyopathies: Secondary | ICD-10-CM | POA: Diagnosis not present

## 2022-12-07 DIAGNOSIS — E8770 Fluid overload, unspecified: Secondary | ICD-10-CM | POA: Diagnosis not present

## 2022-12-08 DIAGNOSIS — I428 Other cardiomyopathies: Secondary | ICD-10-CM | POA: Diagnosis not present

## 2022-12-08 DIAGNOSIS — E271 Primary adrenocortical insufficiency: Secondary | ICD-10-CM | POA: Diagnosis not present

## 2022-12-08 DIAGNOSIS — G8929 Other chronic pain: Secondary | ICD-10-CM | POA: Diagnosis not present

## 2022-12-08 DIAGNOSIS — E8770 Fluid overload, unspecified: Secondary | ICD-10-CM | POA: Diagnosis not present

## 2022-12-08 DIAGNOSIS — I130 Hypertensive heart and chronic kidney disease with heart failure and stage 1 through stage 4 chronic kidney disease, or unspecified chronic kidney disease: Secondary | ICD-10-CM | POA: Diagnosis not present

## 2022-12-08 DIAGNOSIS — Z8541 Personal history of malignant neoplasm of cervix uteri: Secondary | ICD-10-CM | POA: Diagnosis not present

## 2022-12-08 DIAGNOSIS — K219 Gastro-esophageal reflux disease without esophagitis: Secondary | ICD-10-CM | POA: Diagnosis not present

## 2022-12-08 DIAGNOSIS — Z8543 Personal history of malignant neoplasm of ovary: Secondary | ICD-10-CM | POA: Diagnosis not present

## 2022-12-08 DIAGNOSIS — D352 Benign neoplasm of pituitary gland: Secondary | ICD-10-CM | POA: Diagnosis not present

## 2022-12-08 DIAGNOSIS — J159 Unspecified bacterial pneumonia: Secondary | ICD-10-CM | POA: Diagnosis not present

## 2022-12-08 DIAGNOSIS — Z8571 Personal history of Hodgkin lymphoma: Secondary | ICD-10-CM | POA: Diagnosis not present

## 2022-12-08 DIAGNOSIS — R630 Anorexia: Secondary | ICD-10-CM | POA: Diagnosis not present

## 2022-12-08 DIAGNOSIS — Z853 Personal history of malignant neoplasm of breast: Secondary | ICD-10-CM | POA: Diagnosis not present

## 2022-12-08 DIAGNOSIS — I5030 Unspecified diastolic (congestive) heart failure: Secondary | ICD-10-CM | POA: Diagnosis not present

## 2022-12-08 DIAGNOSIS — K209 Esophagitis, unspecified without bleeding: Secondary | ICD-10-CM | POA: Diagnosis not present

## 2022-12-08 DIAGNOSIS — N189 Chronic kidney disease, unspecified: Secondary | ICD-10-CM | POA: Diagnosis not present

## 2022-12-09 DIAGNOSIS — D352 Benign neoplasm of pituitary gland: Secondary | ICD-10-CM | POA: Diagnosis not present

## 2022-12-09 DIAGNOSIS — G8929 Other chronic pain: Secondary | ICD-10-CM | POA: Diagnosis not present

## 2022-12-09 DIAGNOSIS — I428 Other cardiomyopathies: Secondary | ICD-10-CM | POA: Diagnosis not present

## 2022-12-09 DIAGNOSIS — Z8543 Personal history of malignant neoplasm of ovary: Secondary | ICD-10-CM | POA: Diagnosis not present

## 2022-12-09 DIAGNOSIS — I130 Hypertensive heart and chronic kidney disease with heart failure and stage 1 through stage 4 chronic kidney disease, or unspecified chronic kidney disease: Secondary | ICD-10-CM | POA: Diagnosis not present

## 2022-12-09 DIAGNOSIS — E271 Primary adrenocortical insufficiency: Secondary | ICD-10-CM | POA: Diagnosis not present

## 2022-12-09 DIAGNOSIS — J159 Unspecified bacterial pneumonia: Secondary | ICD-10-CM | POA: Diagnosis not present

## 2022-12-09 DIAGNOSIS — Z853 Personal history of malignant neoplasm of breast: Secondary | ICD-10-CM | POA: Diagnosis not present

## 2022-12-09 DIAGNOSIS — K209 Esophagitis, unspecified without bleeding: Secondary | ICD-10-CM | POA: Diagnosis not present

## 2022-12-09 DIAGNOSIS — R630 Anorexia: Secondary | ICD-10-CM | POA: Diagnosis not present

## 2022-12-09 DIAGNOSIS — K219 Gastro-esophageal reflux disease without esophagitis: Secondary | ICD-10-CM | POA: Diagnosis not present

## 2022-12-09 DIAGNOSIS — Z8541 Personal history of malignant neoplasm of cervix uteri: Secondary | ICD-10-CM | POA: Diagnosis not present

## 2022-12-09 DIAGNOSIS — I5030 Unspecified diastolic (congestive) heart failure: Secondary | ICD-10-CM | POA: Diagnosis not present

## 2022-12-09 DIAGNOSIS — N189 Chronic kidney disease, unspecified: Secondary | ICD-10-CM | POA: Diagnosis not present

## 2022-12-09 DIAGNOSIS — E8770 Fluid overload, unspecified: Secondary | ICD-10-CM | POA: Diagnosis not present

## 2022-12-09 DIAGNOSIS — Z8571 Personal history of Hodgkin lymphoma: Secondary | ICD-10-CM | POA: Diagnosis not present

## 2022-12-10 DIAGNOSIS — Z8543 Personal history of malignant neoplasm of ovary: Secondary | ICD-10-CM | POA: Diagnosis not present

## 2022-12-10 DIAGNOSIS — N189 Chronic kidney disease, unspecified: Secondary | ICD-10-CM | POA: Diagnosis not present

## 2022-12-10 DIAGNOSIS — Z8571 Personal history of Hodgkin lymphoma: Secondary | ICD-10-CM | POA: Diagnosis not present

## 2022-12-10 DIAGNOSIS — G8929 Other chronic pain: Secondary | ICD-10-CM | POA: Diagnosis not present

## 2022-12-10 DIAGNOSIS — D352 Benign neoplasm of pituitary gland: Secondary | ICD-10-CM | POA: Diagnosis not present

## 2022-12-10 DIAGNOSIS — K219 Gastro-esophageal reflux disease without esophagitis: Secondary | ICD-10-CM | POA: Diagnosis not present

## 2022-12-10 DIAGNOSIS — I428 Other cardiomyopathies: Secondary | ICD-10-CM | POA: Diagnosis not present

## 2022-12-10 DIAGNOSIS — Z853 Personal history of malignant neoplasm of breast: Secondary | ICD-10-CM | POA: Diagnosis not present

## 2022-12-10 DIAGNOSIS — E8770 Fluid overload, unspecified: Secondary | ICD-10-CM | POA: Diagnosis not present

## 2022-12-10 DIAGNOSIS — R630 Anorexia: Secondary | ICD-10-CM | POA: Diagnosis not present

## 2022-12-10 DIAGNOSIS — Z8541 Personal history of malignant neoplasm of cervix uteri: Secondary | ICD-10-CM | POA: Diagnosis not present

## 2022-12-10 DIAGNOSIS — K209 Esophagitis, unspecified without bleeding: Secondary | ICD-10-CM | POA: Diagnosis not present

## 2022-12-10 DIAGNOSIS — E271 Primary adrenocortical insufficiency: Secondary | ICD-10-CM | POA: Diagnosis not present

## 2022-12-10 DIAGNOSIS — I130 Hypertensive heart and chronic kidney disease with heart failure and stage 1 through stage 4 chronic kidney disease, or unspecified chronic kidney disease: Secondary | ICD-10-CM | POA: Diagnosis not present

## 2022-12-10 DIAGNOSIS — I5030 Unspecified diastolic (congestive) heart failure: Secondary | ICD-10-CM | POA: Diagnosis not present

## 2022-12-10 DIAGNOSIS — J159 Unspecified bacterial pneumonia: Secondary | ICD-10-CM | POA: Diagnosis not present

## 2022-12-11 DIAGNOSIS — N189 Chronic kidney disease, unspecified: Secondary | ICD-10-CM | POA: Diagnosis not present

## 2022-12-11 DIAGNOSIS — I130 Hypertensive heart and chronic kidney disease with heart failure and stage 1 through stage 4 chronic kidney disease, or unspecified chronic kidney disease: Secondary | ICD-10-CM | POA: Diagnosis not present

## 2022-12-11 DIAGNOSIS — R630 Anorexia: Secondary | ICD-10-CM | POA: Diagnosis not present

## 2022-12-11 DIAGNOSIS — Z8571 Personal history of Hodgkin lymphoma: Secondary | ICD-10-CM | POA: Diagnosis not present

## 2022-12-11 DIAGNOSIS — G8929 Other chronic pain: Secondary | ICD-10-CM | POA: Diagnosis not present

## 2022-12-11 DIAGNOSIS — K209 Esophagitis, unspecified without bleeding: Secondary | ICD-10-CM | POA: Diagnosis not present

## 2022-12-11 DIAGNOSIS — D352 Benign neoplasm of pituitary gland: Secondary | ICD-10-CM | POA: Diagnosis not present

## 2022-12-11 DIAGNOSIS — I5030 Unspecified diastolic (congestive) heart failure: Secondary | ICD-10-CM | POA: Diagnosis not present

## 2022-12-11 DIAGNOSIS — E8770 Fluid overload, unspecified: Secondary | ICD-10-CM | POA: Diagnosis not present

## 2022-12-11 DIAGNOSIS — Z8541 Personal history of malignant neoplasm of cervix uteri: Secondary | ICD-10-CM | POA: Diagnosis not present

## 2022-12-11 DIAGNOSIS — K219 Gastro-esophageal reflux disease without esophagitis: Secondary | ICD-10-CM | POA: Diagnosis not present

## 2022-12-11 DIAGNOSIS — Z853 Personal history of malignant neoplasm of breast: Secondary | ICD-10-CM | POA: Diagnosis not present

## 2022-12-11 DIAGNOSIS — E271 Primary adrenocortical insufficiency: Secondary | ICD-10-CM | POA: Diagnosis not present

## 2022-12-11 DIAGNOSIS — I428 Other cardiomyopathies: Secondary | ICD-10-CM | POA: Diagnosis not present

## 2022-12-11 DIAGNOSIS — Z8543 Personal history of malignant neoplasm of ovary: Secondary | ICD-10-CM | POA: Diagnosis not present

## 2022-12-11 DIAGNOSIS — J159 Unspecified bacterial pneumonia: Secondary | ICD-10-CM | POA: Diagnosis not present

## 2022-12-12 DIAGNOSIS — Z853 Personal history of malignant neoplasm of breast: Secondary | ICD-10-CM | POA: Diagnosis not present

## 2022-12-12 DIAGNOSIS — N189 Chronic kidney disease, unspecified: Secondary | ICD-10-CM | POA: Diagnosis not present

## 2022-12-12 DIAGNOSIS — I428 Other cardiomyopathies: Secondary | ICD-10-CM | POA: Diagnosis not present

## 2022-12-12 DIAGNOSIS — J159 Unspecified bacterial pneumonia: Secondary | ICD-10-CM | POA: Diagnosis not present

## 2022-12-12 DIAGNOSIS — K209 Esophagitis, unspecified without bleeding: Secondary | ICD-10-CM | POA: Diagnosis not present

## 2022-12-12 DIAGNOSIS — R630 Anorexia: Secondary | ICD-10-CM | POA: Diagnosis not present

## 2022-12-12 DIAGNOSIS — Z8541 Personal history of malignant neoplasm of cervix uteri: Secondary | ICD-10-CM | POA: Diagnosis not present

## 2022-12-12 DIAGNOSIS — E8770 Fluid overload, unspecified: Secondary | ICD-10-CM | POA: Diagnosis not present

## 2022-12-12 DIAGNOSIS — E271 Primary adrenocortical insufficiency: Secondary | ICD-10-CM | POA: Diagnosis not present

## 2022-12-12 DIAGNOSIS — G8929 Other chronic pain: Secondary | ICD-10-CM | POA: Diagnosis not present

## 2022-12-12 DIAGNOSIS — D352 Benign neoplasm of pituitary gland: Secondary | ICD-10-CM | POA: Diagnosis not present

## 2022-12-12 DIAGNOSIS — I130 Hypertensive heart and chronic kidney disease with heart failure and stage 1 through stage 4 chronic kidney disease, or unspecified chronic kidney disease: Secondary | ICD-10-CM | POA: Diagnosis not present

## 2022-12-12 DIAGNOSIS — I5030 Unspecified diastolic (congestive) heart failure: Secondary | ICD-10-CM | POA: Diagnosis not present

## 2022-12-12 DIAGNOSIS — Z8571 Personal history of Hodgkin lymphoma: Secondary | ICD-10-CM | POA: Diagnosis not present

## 2022-12-12 DIAGNOSIS — Z8543 Personal history of malignant neoplasm of ovary: Secondary | ICD-10-CM | POA: Diagnosis not present

## 2022-12-12 DIAGNOSIS — K219 Gastro-esophageal reflux disease without esophagitis: Secondary | ICD-10-CM | POA: Diagnosis not present

## 2022-12-13 DIAGNOSIS — K219 Gastro-esophageal reflux disease without esophagitis: Secondary | ICD-10-CM | POA: Diagnosis not present

## 2022-12-13 DIAGNOSIS — I428 Other cardiomyopathies: Secondary | ICD-10-CM | POA: Diagnosis not present

## 2022-12-13 DIAGNOSIS — Z853 Personal history of malignant neoplasm of breast: Secondary | ICD-10-CM | POA: Diagnosis not present

## 2022-12-13 DIAGNOSIS — R630 Anorexia: Secondary | ICD-10-CM | POA: Diagnosis not present

## 2022-12-13 DIAGNOSIS — G8929 Other chronic pain: Secondary | ICD-10-CM | POA: Diagnosis not present

## 2022-12-13 DIAGNOSIS — I5030 Unspecified diastolic (congestive) heart failure: Secondary | ICD-10-CM | POA: Diagnosis not present

## 2022-12-13 DIAGNOSIS — J159 Unspecified bacterial pneumonia: Secondary | ICD-10-CM | POA: Diagnosis not present

## 2022-12-13 DIAGNOSIS — D352 Benign neoplasm of pituitary gland: Secondary | ICD-10-CM | POA: Diagnosis not present

## 2022-12-13 DIAGNOSIS — I130 Hypertensive heart and chronic kidney disease with heart failure and stage 1 through stage 4 chronic kidney disease, or unspecified chronic kidney disease: Secondary | ICD-10-CM | POA: Diagnosis not present

## 2022-12-13 DIAGNOSIS — K209 Esophagitis, unspecified without bleeding: Secondary | ICD-10-CM | POA: Diagnosis not present

## 2022-12-13 DIAGNOSIS — Z8541 Personal history of malignant neoplasm of cervix uteri: Secondary | ICD-10-CM | POA: Diagnosis not present

## 2022-12-13 DIAGNOSIS — N189 Chronic kidney disease, unspecified: Secondary | ICD-10-CM | POA: Diagnosis not present

## 2022-12-13 DIAGNOSIS — Z8543 Personal history of malignant neoplasm of ovary: Secondary | ICD-10-CM | POA: Diagnosis not present

## 2022-12-13 DIAGNOSIS — Z8571 Personal history of Hodgkin lymphoma: Secondary | ICD-10-CM | POA: Diagnosis not present

## 2022-12-13 DIAGNOSIS — E8770 Fluid overload, unspecified: Secondary | ICD-10-CM | POA: Diagnosis not present

## 2022-12-13 DIAGNOSIS — E271 Primary adrenocortical insufficiency: Secondary | ICD-10-CM | POA: Diagnosis not present

## 2022-12-14 DIAGNOSIS — I130 Hypertensive heart and chronic kidney disease with heart failure and stage 1 through stage 4 chronic kidney disease, or unspecified chronic kidney disease: Secondary | ICD-10-CM | POA: Diagnosis not present

## 2022-12-14 DIAGNOSIS — Z8543 Personal history of malignant neoplasm of ovary: Secondary | ICD-10-CM | POA: Diagnosis not present

## 2022-12-14 DIAGNOSIS — D352 Benign neoplasm of pituitary gland: Secondary | ICD-10-CM | POA: Diagnosis not present

## 2022-12-14 DIAGNOSIS — E8770 Fluid overload, unspecified: Secondary | ICD-10-CM | POA: Diagnosis not present

## 2022-12-14 DIAGNOSIS — R630 Anorexia: Secondary | ICD-10-CM | POA: Diagnosis not present

## 2022-12-14 DIAGNOSIS — J159 Unspecified bacterial pneumonia: Secondary | ICD-10-CM | POA: Diagnosis not present

## 2022-12-14 DIAGNOSIS — K219 Gastro-esophageal reflux disease without esophagitis: Secondary | ICD-10-CM | POA: Diagnosis not present

## 2022-12-14 DIAGNOSIS — I428 Other cardiomyopathies: Secondary | ICD-10-CM | POA: Diagnosis not present

## 2022-12-14 DIAGNOSIS — Z8571 Personal history of Hodgkin lymphoma: Secondary | ICD-10-CM | POA: Diagnosis not present

## 2022-12-14 DIAGNOSIS — I5030 Unspecified diastolic (congestive) heart failure: Secondary | ICD-10-CM | POA: Diagnosis not present

## 2022-12-14 DIAGNOSIS — G8929 Other chronic pain: Secondary | ICD-10-CM | POA: Diagnosis not present

## 2022-12-14 DIAGNOSIS — E271 Primary adrenocortical insufficiency: Secondary | ICD-10-CM | POA: Diagnosis not present

## 2022-12-14 DIAGNOSIS — K209 Esophagitis, unspecified without bleeding: Secondary | ICD-10-CM | POA: Diagnosis not present

## 2022-12-14 DIAGNOSIS — N189 Chronic kidney disease, unspecified: Secondary | ICD-10-CM | POA: Diagnosis not present

## 2022-12-14 DIAGNOSIS — Z8541 Personal history of malignant neoplasm of cervix uteri: Secondary | ICD-10-CM | POA: Diagnosis not present

## 2022-12-14 DIAGNOSIS — Z853 Personal history of malignant neoplasm of breast: Secondary | ICD-10-CM | POA: Diagnosis not present

## 2022-12-15 DIAGNOSIS — R630 Anorexia: Secondary | ICD-10-CM | POA: Diagnosis not present

## 2022-12-15 DIAGNOSIS — I130 Hypertensive heart and chronic kidney disease with heart failure and stage 1 through stage 4 chronic kidney disease, or unspecified chronic kidney disease: Secondary | ICD-10-CM | POA: Diagnosis not present

## 2022-12-15 DIAGNOSIS — N189 Chronic kidney disease, unspecified: Secondary | ICD-10-CM | POA: Diagnosis not present

## 2022-12-15 DIAGNOSIS — Z8543 Personal history of malignant neoplasm of ovary: Secondary | ICD-10-CM | POA: Diagnosis not present

## 2022-12-15 DIAGNOSIS — K209 Esophagitis, unspecified without bleeding: Secondary | ICD-10-CM | POA: Diagnosis not present

## 2022-12-15 DIAGNOSIS — K219 Gastro-esophageal reflux disease without esophagitis: Secondary | ICD-10-CM | POA: Diagnosis not present

## 2022-12-15 DIAGNOSIS — Z8571 Personal history of Hodgkin lymphoma: Secondary | ICD-10-CM | POA: Diagnosis not present

## 2022-12-15 DIAGNOSIS — J159 Unspecified bacterial pneumonia: Secondary | ICD-10-CM | POA: Diagnosis not present

## 2022-12-15 DIAGNOSIS — I5030 Unspecified diastolic (congestive) heart failure: Secondary | ICD-10-CM | POA: Diagnosis not present

## 2022-12-15 DIAGNOSIS — G8929 Other chronic pain: Secondary | ICD-10-CM | POA: Diagnosis not present

## 2022-12-15 DIAGNOSIS — Z853 Personal history of malignant neoplasm of breast: Secondary | ICD-10-CM | POA: Diagnosis not present

## 2022-12-15 DIAGNOSIS — E271 Primary adrenocortical insufficiency: Secondary | ICD-10-CM | POA: Diagnosis not present

## 2022-12-15 DIAGNOSIS — D352 Benign neoplasm of pituitary gland: Secondary | ICD-10-CM | POA: Diagnosis not present

## 2022-12-15 DIAGNOSIS — Z8541 Personal history of malignant neoplasm of cervix uteri: Secondary | ICD-10-CM | POA: Diagnosis not present

## 2022-12-15 DIAGNOSIS — E8770 Fluid overload, unspecified: Secondary | ICD-10-CM | POA: Diagnosis not present

## 2022-12-15 DIAGNOSIS — I428 Other cardiomyopathies: Secondary | ICD-10-CM | POA: Diagnosis not present

## 2022-12-16 DIAGNOSIS — I130 Hypertensive heart and chronic kidney disease with heart failure and stage 1 through stage 4 chronic kidney disease, or unspecified chronic kidney disease: Secondary | ICD-10-CM | POA: Diagnosis not present

## 2022-12-16 DIAGNOSIS — R630 Anorexia: Secondary | ICD-10-CM | POA: Diagnosis not present

## 2022-12-16 DIAGNOSIS — Z8543 Personal history of malignant neoplasm of ovary: Secondary | ICD-10-CM | POA: Diagnosis not present

## 2022-12-16 DIAGNOSIS — E8770 Fluid overload, unspecified: Secondary | ICD-10-CM | POA: Diagnosis not present

## 2022-12-16 DIAGNOSIS — J159 Unspecified bacterial pneumonia: Secondary | ICD-10-CM | POA: Diagnosis not present

## 2022-12-16 DIAGNOSIS — E271 Primary adrenocortical insufficiency: Secondary | ICD-10-CM | POA: Diagnosis not present

## 2022-12-16 DIAGNOSIS — I428 Other cardiomyopathies: Secondary | ICD-10-CM | POA: Diagnosis not present

## 2022-12-16 DIAGNOSIS — I5030 Unspecified diastolic (congestive) heart failure: Secondary | ICD-10-CM | POA: Diagnosis not present

## 2022-12-16 DIAGNOSIS — G8929 Other chronic pain: Secondary | ICD-10-CM | POA: Diagnosis not present

## 2022-12-16 DIAGNOSIS — K209 Esophagitis, unspecified without bleeding: Secondary | ICD-10-CM | POA: Diagnosis not present

## 2022-12-16 DIAGNOSIS — D352 Benign neoplasm of pituitary gland: Secondary | ICD-10-CM | POA: Diagnosis not present

## 2022-12-16 DIAGNOSIS — Z8541 Personal history of malignant neoplasm of cervix uteri: Secondary | ICD-10-CM | POA: Diagnosis not present

## 2022-12-16 DIAGNOSIS — N189 Chronic kidney disease, unspecified: Secondary | ICD-10-CM | POA: Diagnosis not present

## 2022-12-16 DIAGNOSIS — K219 Gastro-esophageal reflux disease without esophagitis: Secondary | ICD-10-CM | POA: Diagnosis not present

## 2022-12-16 DIAGNOSIS — Z853 Personal history of malignant neoplasm of breast: Secondary | ICD-10-CM | POA: Diagnosis not present

## 2022-12-16 DIAGNOSIS — Z8571 Personal history of Hodgkin lymphoma: Secondary | ICD-10-CM | POA: Diagnosis not present

## 2022-12-17 DIAGNOSIS — E271 Primary adrenocortical insufficiency: Secondary | ICD-10-CM | POA: Diagnosis not present

## 2022-12-17 DIAGNOSIS — N189 Chronic kidney disease, unspecified: Secondary | ICD-10-CM | POA: Diagnosis not present

## 2022-12-17 DIAGNOSIS — G8929 Other chronic pain: Secondary | ICD-10-CM | POA: Diagnosis not present

## 2022-12-17 DIAGNOSIS — D352 Benign neoplasm of pituitary gland: Secondary | ICD-10-CM | POA: Diagnosis not present

## 2022-12-17 DIAGNOSIS — K209 Esophagitis, unspecified without bleeding: Secondary | ICD-10-CM | POA: Diagnosis not present

## 2022-12-17 DIAGNOSIS — Z853 Personal history of malignant neoplasm of breast: Secondary | ICD-10-CM | POA: Diagnosis not present

## 2022-12-17 DIAGNOSIS — K219 Gastro-esophageal reflux disease without esophagitis: Secondary | ICD-10-CM | POA: Diagnosis not present

## 2022-12-17 DIAGNOSIS — I5030 Unspecified diastolic (congestive) heart failure: Secondary | ICD-10-CM | POA: Diagnosis not present

## 2022-12-17 DIAGNOSIS — Z8541 Personal history of malignant neoplasm of cervix uteri: Secondary | ICD-10-CM | POA: Diagnosis not present

## 2022-12-17 DIAGNOSIS — Z8543 Personal history of malignant neoplasm of ovary: Secondary | ICD-10-CM | POA: Diagnosis not present

## 2022-12-17 DIAGNOSIS — I130 Hypertensive heart and chronic kidney disease with heart failure and stage 1 through stage 4 chronic kidney disease, or unspecified chronic kidney disease: Secondary | ICD-10-CM | POA: Diagnosis not present

## 2022-12-17 DIAGNOSIS — Z8571 Personal history of Hodgkin lymphoma: Secondary | ICD-10-CM | POA: Diagnosis not present

## 2022-12-17 DIAGNOSIS — J159 Unspecified bacterial pneumonia: Secondary | ICD-10-CM | POA: Diagnosis not present

## 2022-12-17 DIAGNOSIS — I428 Other cardiomyopathies: Secondary | ICD-10-CM | POA: Diagnosis not present

## 2022-12-17 DIAGNOSIS — R630 Anorexia: Secondary | ICD-10-CM | POA: Diagnosis not present

## 2022-12-17 DIAGNOSIS — E8770 Fluid overload, unspecified: Secondary | ICD-10-CM | POA: Diagnosis not present

## 2022-12-18 ENCOUNTER — Other Ambulatory Visit: Payer: Self-pay | Admitting: Adult Health

## 2022-12-18 DIAGNOSIS — I130 Hypertensive heart and chronic kidney disease with heart failure and stage 1 through stage 4 chronic kidney disease, or unspecified chronic kidney disease: Secondary | ICD-10-CM | POA: Diagnosis not present

## 2022-12-18 DIAGNOSIS — Z853 Personal history of malignant neoplasm of breast: Secondary | ICD-10-CM | POA: Diagnosis not present

## 2022-12-18 DIAGNOSIS — D352 Benign neoplasm of pituitary gland: Secondary | ICD-10-CM | POA: Diagnosis not present

## 2022-12-18 DIAGNOSIS — I428 Other cardiomyopathies: Secondary | ICD-10-CM | POA: Diagnosis not present

## 2022-12-18 DIAGNOSIS — G8929 Other chronic pain: Secondary | ICD-10-CM | POA: Diagnosis not present

## 2022-12-18 DIAGNOSIS — N189 Chronic kidney disease, unspecified: Secondary | ICD-10-CM | POA: Diagnosis not present

## 2022-12-18 DIAGNOSIS — Z8571 Personal history of Hodgkin lymphoma: Secondary | ICD-10-CM | POA: Diagnosis not present

## 2022-12-18 DIAGNOSIS — E271 Primary adrenocortical insufficiency: Secondary | ICD-10-CM | POA: Diagnosis not present

## 2022-12-18 DIAGNOSIS — Z8543 Personal history of malignant neoplasm of ovary: Secondary | ICD-10-CM | POA: Diagnosis not present

## 2022-12-18 DIAGNOSIS — I5030 Unspecified diastolic (congestive) heart failure: Secondary | ICD-10-CM | POA: Diagnosis not present

## 2022-12-18 DIAGNOSIS — K219 Gastro-esophageal reflux disease without esophagitis: Secondary | ICD-10-CM | POA: Diagnosis not present

## 2022-12-18 DIAGNOSIS — Z8541 Personal history of malignant neoplasm of cervix uteri: Secondary | ICD-10-CM | POA: Diagnosis not present

## 2022-12-18 DIAGNOSIS — E8770 Fluid overload, unspecified: Secondary | ICD-10-CM | POA: Diagnosis not present

## 2022-12-18 DIAGNOSIS — R630 Anorexia: Secondary | ICD-10-CM | POA: Diagnosis not present

## 2022-12-18 DIAGNOSIS — J159 Unspecified bacterial pneumonia: Secondary | ICD-10-CM | POA: Diagnosis not present

## 2022-12-18 DIAGNOSIS — K209 Esophagitis, unspecified without bleeding: Secondary | ICD-10-CM | POA: Diagnosis not present

## 2022-12-19 ENCOUNTER — Other Ambulatory Visit (HOSPITAL_COMMUNITY): Payer: Self-pay

## 2022-12-19 ENCOUNTER — Encounter: Payer: Self-pay | Admitting: Hematology and Oncology

## 2022-12-19 DIAGNOSIS — Z8571 Personal history of Hodgkin lymphoma: Secondary | ICD-10-CM | POA: Diagnosis not present

## 2022-12-19 DIAGNOSIS — K209 Esophagitis, unspecified without bleeding: Secondary | ICD-10-CM | POA: Diagnosis not present

## 2022-12-19 DIAGNOSIS — J159 Unspecified bacterial pneumonia: Secondary | ICD-10-CM | POA: Diagnosis not present

## 2022-12-19 DIAGNOSIS — I428 Other cardiomyopathies: Secondary | ICD-10-CM | POA: Diagnosis not present

## 2022-12-19 DIAGNOSIS — N189 Chronic kidney disease, unspecified: Secondary | ICD-10-CM | POA: Diagnosis not present

## 2022-12-19 DIAGNOSIS — R630 Anorexia: Secondary | ICD-10-CM | POA: Diagnosis not present

## 2022-12-19 DIAGNOSIS — Z8541 Personal history of malignant neoplasm of cervix uteri: Secondary | ICD-10-CM | POA: Diagnosis not present

## 2022-12-19 DIAGNOSIS — I5030 Unspecified diastolic (congestive) heart failure: Secondary | ICD-10-CM | POA: Diagnosis not present

## 2022-12-19 DIAGNOSIS — Z853 Personal history of malignant neoplasm of breast: Secondary | ICD-10-CM | POA: Diagnosis not present

## 2022-12-19 DIAGNOSIS — G8929 Other chronic pain: Secondary | ICD-10-CM | POA: Diagnosis not present

## 2022-12-19 DIAGNOSIS — I130 Hypertensive heart and chronic kidney disease with heart failure and stage 1 through stage 4 chronic kidney disease, or unspecified chronic kidney disease: Secondary | ICD-10-CM | POA: Diagnosis not present

## 2022-12-19 DIAGNOSIS — D352 Benign neoplasm of pituitary gland: Secondary | ICD-10-CM | POA: Diagnosis not present

## 2022-12-19 DIAGNOSIS — K219 Gastro-esophageal reflux disease without esophagitis: Secondary | ICD-10-CM | POA: Diagnosis not present

## 2022-12-19 DIAGNOSIS — E8770 Fluid overload, unspecified: Secondary | ICD-10-CM | POA: Diagnosis not present

## 2022-12-19 DIAGNOSIS — E271 Primary adrenocortical insufficiency: Secondary | ICD-10-CM | POA: Diagnosis not present

## 2022-12-19 DIAGNOSIS — Z8543 Personal history of malignant neoplasm of ovary: Secondary | ICD-10-CM | POA: Diagnosis not present

## 2022-12-19 MED ORDER — HYDROMORPHONE HCL 4 MG PO TABS
ORAL_TABLET | ORAL | 0 refills | Status: DC
  Filled 2022-12-19: qty 42, 7d supply, fill #0

## 2022-12-20 ENCOUNTER — Other Ambulatory Visit (HOSPITAL_COMMUNITY): Payer: Self-pay

## 2022-12-20 ENCOUNTER — Other Ambulatory Visit: Payer: Self-pay | Admitting: Adult Health

## 2022-12-20 DIAGNOSIS — Z8543 Personal history of malignant neoplasm of ovary: Secondary | ICD-10-CM | POA: Diagnosis not present

## 2022-12-20 DIAGNOSIS — E8770 Fluid overload, unspecified: Secondary | ICD-10-CM | POA: Diagnosis not present

## 2022-12-20 DIAGNOSIS — N189 Chronic kidney disease, unspecified: Secondary | ICD-10-CM | POA: Diagnosis not present

## 2022-12-20 DIAGNOSIS — I428 Other cardiomyopathies: Secondary | ICD-10-CM | POA: Diagnosis not present

## 2022-12-20 DIAGNOSIS — I130 Hypertensive heart and chronic kidney disease with heart failure and stage 1 through stage 4 chronic kidney disease, or unspecified chronic kidney disease: Secondary | ICD-10-CM | POA: Diagnosis not present

## 2022-12-20 DIAGNOSIS — R630 Anorexia: Secondary | ICD-10-CM | POA: Diagnosis not present

## 2022-12-20 DIAGNOSIS — K209 Esophagitis, unspecified without bleeding: Secondary | ICD-10-CM | POA: Diagnosis not present

## 2022-12-20 DIAGNOSIS — Z8571 Personal history of Hodgkin lymphoma: Secondary | ICD-10-CM | POA: Diagnosis not present

## 2022-12-20 DIAGNOSIS — J159 Unspecified bacterial pneumonia: Secondary | ICD-10-CM | POA: Diagnosis not present

## 2022-12-20 DIAGNOSIS — F331 Major depressive disorder, recurrent, moderate: Secondary | ICD-10-CM

## 2022-12-20 DIAGNOSIS — K219 Gastro-esophageal reflux disease without esophagitis: Secondary | ICD-10-CM | POA: Diagnosis not present

## 2022-12-20 DIAGNOSIS — Z853 Personal history of malignant neoplasm of breast: Secondary | ICD-10-CM | POA: Diagnosis not present

## 2022-12-20 DIAGNOSIS — I5030 Unspecified diastolic (congestive) heart failure: Secondary | ICD-10-CM | POA: Diagnosis not present

## 2022-12-20 DIAGNOSIS — G8929 Other chronic pain: Secondary | ICD-10-CM | POA: Diagnosis not present

## 2022-12-20 DIAGNOSIS — E271 Primary adrenocortical insufficiency: Secondary | ICD-10-CM | POA: Diagnosis not present

## 2022-12-20 DIAGNOSIS — Z8541 Personal history of malignant neoplasm of cervix uteri: Secondary | ICD-10-CM | POA: Diagnosis not present

## 2022-12-20 DIAGNOSIS — D352 Benign neoplasm of pituitary gland: Secondary | ICD-10-CM | POA: Diagnosis not present

## 2022-12-21 DIAGNOSIS — N189 Chronic kidney disease, unspecified: Secondary | ICD-10-CM | POA: Diagnosis not present

## 2022-12-21 DIAGNOSIS — Z853 Personal history of malignant neoplasm of breast: Secondary | ICD-10-CM | POA: Diagnosis not present

## 2022-12-21 DIAGNOSIS — Z8541 Personal history of malignant neoplasm of cervix uteri: Secondary | ICD-10-CM | POA: Diagnosis not present

## 2022-12-21 DIAGNOSIS — J159 Unspecified bacterial pneumonia: Secondary | ICD-10-CM | POA: Diagnosis not present

## 2022-12-21 DIAGNOSIS — K219 Gastro-esophageal reflux disease without esophagitis: Secondary | ICD-10-CM | POA: Diagnosis not present

## 2022-12-21 DIAGNOSIS — Z8571 Personal history of Hodgkin lymphoma: Secondary | ICD-10-CM | POA: Diagnosis not present

## 2022-12-21 DIAGNOSIS — I5030 Unspecified diastolic (congestive) heart failure: Secondary | ICD-10-CM | POA: Diagnosis not present

## 2022-12-21 DIAGNOSIS — R630 Anorexia: Secondary | ICD-10-CM | POA: Diagnosis not present

## 2022-12-21 DIAGNOSIS — Z8543 Personal history of malignant neoplasm of ovary: Secondary | ICD-10-CM | POA: Diagnosis not present

## 2022-12-21 DIAGNOSIS — E8770 Fluid overload, unspecified: Secondary | ICD-10-CM | POA: Diagnosis not present

## 2022-12-21 DIAGNOSIS — I428 Other cardiomyopathies: Secondary | ICD-10-CM | POA: Diagnosis not present

## 2022-12-21 DIAGNOSIS — D352 Benign neoplasm of pituitary gland: Secondary | ICD-10-CM | POA: Diagnosis not present

## 2022-12-21 DIAGNOSIS — G8929 Other chronic pain: Secondary | ICD-10-CM | POA: Diagnosis not present

## 2022-12-21 DIAGNOSIS — K209 Esophagitis, unspecified without bleeding: Secondary | ICD-10-CM | POA: Diagnosis not present

## 2022-12-21 DIAGNOSIS — E271 Primary adrenocortical insufficiency: Secondary | ICD-10-CM | POA: Diagnosis not present

## 2022-12-21 DIAGNOSIS — I130 Hypertensive heart and chronic kidney disease with heart failure and stage 1 through stage 4 chronic kidney disease, or unspecified chronic kidney disease: Secondary | ICD-10-CM | POA: Diagnosis not present

## 2022-12-22 DIAGNOSIS — E271 Primary adrenocortical insufficiency: Secondary | ICD-10-CM | POA: Diagnosis not present

## 2022-12-22 DIAGNOSIS — R630 Anorexia: Secondary | ICD-10-CM | POA: Diagnosis not present

## 2022-12-22 DIAGNOSIS — K209 Esophagitis, unspecified without bleeding: Secondary | ICD-10-CM | POA: Diagnosis not present

## 2022-12-22 DIAGNOSIS — Z8571 Personal history of Hodgkin lymphoma: Secondary | ICD-10-CM | POA: Diagnosis not present

## 2022-12-22 DIAGNOSIS — Z853 Personal history of malignant neoplasm of breast: Secondary | ICD-10-CM | POA: Diagnosis not present

## 2022-12-22 DIAGNOSIS — I5030 Unspecified diastolic (congestive) heart failure: Secondary | ICD-10-CM | POA: Diagnosis not present

## 2022-12-22 DIAGNOSIS — D352 Benign neoplasm of pituitary gland: Secondary | ICD-10-CM | POA: Diagnosis not present

## 2022-12-22 DIAGNOSIS — K219 Gastro-esophageal reflux disease without esophagitis: Secondary | ICD-10-CM | POA: Diagnosis not present

## 2022-12-22 DIAGNOSIS — G8929 Other chronic pain: Secondary | ICD-10-CM | POA: Diagnosis not present

## 2022-12-22 DIAGNOSIS — I428 Other cardiomyopathies: Secondary | ICD-10-CM | POA: Diagnosis not present

## 2022-12-22 DIAGNOSIS — N189 Chronic kidney disease, unspecified: Secondary | ICD-10-CM | POA: Diagnosis not present

## 2022-12-22 DIAGNOSIS — E8770 Fluid overload, unspecified: Secondary | ICD-10-CM | POA: Diagnosis not present

## 2022-12-22 DIAGNOSIS — I130 Hypertensive heart and chronic kidney disease with heart failure and stage 1 through stage 4 chronic kidney disease, or unspecified chronic kidney disease: Secondary | ICD-10-CM | POA: Diagnosis not present

## 2022-12-22 DIAGNOSIS — J159 Unspecified bacterial pneumonia: Secondary | ICD-10-CM | POA: Diagnosis not present

## 2022-12-22 DIAGNOSIS — Z8543 Personal history of malignant neoplasm of ovary: Secondary | ICD-10-CM | POA: Diagnosis not present

## 2022-12-22 DIAGNOSIS — Z8541 Personal history of malignant neoplasm of cervix uteri: Secondary | ICD-10-CM | POA: Diagnosis not present

## 2022-12-23 DIAGNOSIS — I5030 Unspecified diastolic (congestive) heart failure: Secondary | ICD-10-CM | POA: Diagnosis not present

## 2022-12-23 DIAGNOSIS — Z8543 Personal history of malignant neoplasm of ovary: Secondary | ICD-10-CM | POA: Diagnosis not present

## 2022-12-23 DIAGNOSIS — R630 Anorexia: Secondary | ICD-10-CM | POA: Diagnosis not present

## 2022-12-23 DIAGNOSIS — K219 Gastro-esophageal reflux disease without esophagitis: Secondary | ICD-10-CM | POA: Diagnosis not present

## 2022-12-23 DIAGNOSIS — Z8571 Personal history of Hodgkin lymphoma: Secondary | ICD-10-CM | POA: Diagnosis not present

## 2022-12-23 DIAGNOSIS — D352 Benign neoplasm of pituitary gland: Secondary | ICD-10-CM | POA: Diagnosis not present

## 2022-12-23 DIAGNOSIS — I130 Hypertensive heart and chronic kidney disease with heart failure and stage 1 through stage 4 chronic kidney disease, or unspecified chronic kidney disease: Secondary | ICD-10-CM | POA: Diagnosis not present

## 2022-12-23 DIAGNOSIS — G8929 Other chronic pain: Secondary | ICD-10-CM | POA: Diagnosis not present

## 2022-12-23 DIAGNOSIS — Z853 Personal history of malignant neoplasm of breast: Secondary | ICD-10-CM | POA: Diagnosis not present

## 2022-12-23 DIAGNOSIS — Z8541 Personal history of malignant neoplasm of cervix uteri: Secondary | ICD-10-CM | POA: Diagnosis not present

## 2022-12-23 DIAGNOSIS — E271 Primary adrenocortical insufficiency: Secondary | ICD-10-CM | POA: Diagnosis not present

## 2022-12-23 DIAGNOSIS — J159 Unspecified bacterial pneumonia: Secondary | ICD-10-CM | POA: Diagnosis not present

## 2022-12-23 DIAGNOSIS — N189 Chronic kidney disease, unspecified: Secondary | ICD-10-CM | POA: Diagnosis not present

## 2022-12-23 DIAGNOSIS — E8770 Fluid overload, unspecified: Secondary | ICD-10-CM | POA: Diagnosis not present

## 2022-12-23 DIAGNOSIS — I428 Other cardiomyopathies: Secondary | ICD-10-CM | POA: Diagnosis not present

## 2022-12-23 DIAGNOSIS — K209 Esophagitis, unspecified without bleeding: Secondary | ICD-10-CM | POA: Diagnosis not present

## 2022-12-23 NOTE — Telephone Encounter (Signed)
Has appt with Gina tomorrow.  ?

## 2022-12-24 ENCOUNTER — Other Ambulatory Visit (HOSPITAL_COMMUNITY): Payer: Self-pay

## 2022-12-24 ENCOUNTER — Telehealth (INDEPENDENT_AMBULATORY_CARE_PROVIDER_SITE_OTHER): Admitting: Adult Health

## 2022-12-24 ENCOUNTER — Encounter: Payer: Self-pay | Admitting: Hematology and Oncology

## 2022-12-24 ENCOUNTER — Encounter: Payer: Self-pay | Admitting: Adult Health

## 2022-12-24 DIAGNOSIS — F410 Panic disorder [episodic paroxysmal anxiety] without agoraphobia: Secondary | ICD-10-CM

## 2022-12-24 DIAGNOSIS — F331 Major depressive disorder, recurrent, moderate: Secondary | ICD-10-CM | POA: Diagnosis not present

## 2022-12-24 DIAGNOSIS — G4700 Insomnia, unspecified: Secondary | ICD-10-CM | POA: Diagnosis not present

## 2022-12-24 DIAGNOSIS — D352 Benign neoplasm of pituitary gland: Secondary | ICD-10-CM | POA: Diagnosis not present

## 2022-12-24 DIAGNOSIS — Z8541 Personal history of malignant neoplasm of cervix uteri: Secondary | ICD-10-CM | POA: Diagnosis not present

## 2022-12-24 DIAGNOSIS — I5030 Unspecified diastolic (congestive) heart failure: Secondary | ICD-10-CM | POA: Diagnosis not present

## 2022-12-24 DIAGNOSIS — G8929 Other chronic pain: Secondary | ICD-10-CM | POA: Diagnosis not present

## 2022-12-24 DIAGNOSIS — Z853 Personal history of malignant neoplasm of breast: Secondary | ICD-10-CM | POA: Diagnosis not present

## 2022-12-24 DIAGNOSIS — K209 Esophagitis, unspecified without bleeding: Secondary | ICD-10-CM | POA: Diagnosis not present

## 2022-12-24 DIAGNOSIS — R630 Anorexia: Secondary | ICD-10-CM | POA: Diagnosis not present

## 2022-12-24 DIAGNOSIS — E8770 Fluid overload, unspecified: Secondary | ICD-10-CM | POA: Diagnosis not present

## 2022-12-24 DIAGNOSIS — E271 Primary adrenocortical insufficiency: Secondary | ICD-10-CM | POA: Diagnosis not present

## 2022-12-24 DIAGNOSIS — F411 Generalized anxiety disorder: Secondary | ICD-10-CM

## 2022-12-24 DIAGNOSIS — K219 Gastro-esophageal reflux disease without esophagitis: Secondary | ICD-10-CM | POA: Diagnosis not present

## 2022-12-24 DIAGNOSIS — J159 Unspecified bacterial pneumonia: Secondary | ICD-10-CM | POA: Diagnosis not present

## 2022-12-24 DIAGNOSIS — N189 Chronic kidney disease, unspecified: Secondary | ICD-10-CM | POA: Diagnosis not present

## 2022-12-24 DIAGNOSIS — I428 Other cardiomyopathies: Secondary | ICD-10-CM | POA: Diagnosis not present

## 2022-12-24 DIAGNOSIS — Z8543 Personal history of malignant neoplasm of ovary: Secondary | ICD-10-CM | POA: Diagnosis not present

## 2022-12-24 DIAGNOSIS — I130 Hypertensive heart and chronic kidney disease with heart failure and stage 1 through stage 4 chronic kidney disease, or unspecified chronic kidney disease: Secondary | ICD-10-CM | POA: Diagnosis not present

## 2022-12-24 DIAGNOSIS — Z8571 Personal history of Hodgkin lymphoma: Secondary | ICD-10-CM | POA: Diagnosis not present

## 2022-12-24 MED ORDER — BUPROPION HCL ER (XL) 150 MG PO TB24: 150.0000 mg | ORAL_TABLET | Freq: Every day | ORAL | 5 refills | Status: DC

## 2022-12-24 MED ORDER — LAMOTRIGINE 200 MG PO TABS: ORAL_TABLET | ORAL | 5 refills | Status: DC

## 2022-12-24 NOTE — Progress Notes (Signed)
[name] [id number] 29-Aug-1971 52 y.o.  Virtual Visit via Video Note  I connected with pt @ on 12/24/22 at  1:40 PM EDT by a video enabled telemedicine application and verified that I am speaking with the correct person using two identifiers.   I discussed the limitations of evaluation and management by telemedicine and the availability of in person appointments. The patient expressed understanding and agreed to proceed.  I discussed the assessment and treatment plan with the patient. The patient was provided an opportunity to ask questions and all were answered. The patient agreed with the plan and demonstrated an understanding of the instructions.   The patient was advised to call back or seek an in-person evaluation if the symptoms worsen or if the condition fails to improve as anticipated.  I provided 25 minutes of non-face-to-face time during this encounter.  The patient was located at home.  The provider was located at [facility].   Aloha Gell, NP   Subjective:   Patient ID:  [name] is a 52 y.o. (DOB [birth year]) female.  Chief Complaint: No chief complaint on file.   HPI JOA MADDUX presents for follow-up of MDD, GAD, insomnia, and panic attacks.  Hospice care.  Describes mood today as "not good". Pleasant. Tearful throughout interview. Mood symptoms - reports depression, anxiety and irritability. Reports panic attacks. Reports worry, rumination, and over thinking.  Stating "I'm really struggling". Health continues to decline - now in hospice care. Family supportive. Decreased interest and motivation. Taking medications as prescribed. Working with Rinaldo Cloud for therapy.  Energy levels poor. Active, does not have a regular exercise routine with current physical disabilities. Enjoys some usual interests and activities. Married. Lives with husband and s-16. Has a daughter - 5 in [place]. Spending time with family.  Appetite decreased. Weight  loss - fluctuates -184 pounds. Sleeps better some nights than others. Averages 2 to 3 hours here and there. Focus and concentration difficulties. Completing tasks. Managing aspects of household. Disabled since 2016. Denies SI or HI.  Denies AH or VH. Denies self harm. Denies substance use.   Previous medication trials: Wellbutrin - mean, Zoloft-not helpful     Review of Systems:  Review of Systems  Musculoskeletal:  Negative for gait problem.  Neurological:  Negative for tremors.  Psychiatric/Behavioral:         Please refer to HPI    Medications: I have reviewed the patient's current medications.  Current Outpatient Medications  Medication Sig Dispense Refill   albuterol (PROVENTIL) (2.5 MG/3ML) 0.083% nebulizer solution Take 3 mLs (2.5 mg total) by nebulization every 6 (six) hours as needed for wheezing or shortness of breath. 360 mL 2   aspirin EC 81 MG tablet Take 1 tablet (81 mg total) by mouth daily. Swallow whole. (Patient taking differently: Take 81 mg by mouth at bedtime. Chewable) 90 tablet 3   benzonatate (TESSALON) 200 MG capsule Take 200 mg by mouth 3 (three) times daily as needed for cough.     Bismuth Tribromoph-Petrolatum (XEROFORM OCCLUSIVE GAUZE STRIP) PADS Apply 1 each topically as directed. 50 each 1   bisoprolol (ZEBETA) 5 MG tablet Take 0.5 tablets (2.5 mg total) by mouth daily. Take 0.5 tablet by mouth daily     bumetanide (BUMEX) 1 MG tablet TAKE 1 TABLET BY MOUTH TWICE A DAY 180 tablet 3   buPROPion (WELLBUTRIN XL) 150 MG 24 hr tablet Take 1 tablet (150 mg total) by mouth daily. 30 tablet 5  clonazePAM (KLONOPIN) 0.5 MG tablet TAKE 1 TABLET (0.5 MG TOTAL) BY MOUTH 3 (THREE) TIMES DAILY AS NEEDED FOR ANXIETY. 90 tablet 2   dexlansoprazole (DEXILANT) 60 MG capsule Take 60 mg by mouth daily.     Diclofenac Potassium,Migraine, (CAMBIA) 50 MG PACK Take 50 mg by mouth daily as needed for migraine.     diphenhydrAMINE (BENADRYL) 12.5 MG/5ML elixir Take 10 mLs (25  mg total) by mouth every 6 (six) hours as needed (nausea). 120 mL 0   DIPHENHYDRAMINE HCL IJ Inject 0.5 mLs into the muscle daily.     dronabinol (MARINOL) 2.5 MG capsule TAKE 1 CAPSULE BY MOUTH TWICE A DAY AS NEEDED NAUSEA (Patient taking differently: Take 2.5 mg by mouth at bedtime.) 60 capsule 0   EMGALITY 120 MG/ML SOSY Inject 120 mg into the skin every 28 (twenty-eight) days.     EPINEPHrine 0.3 mg/0.3 mL IJ SOAJ injection Inject 0.3 mg into the muscle as needed for anaphylaxis. 2 each 2   escitalopram (LEXAPRO) 20 MG tablet TAKE 1 TABLET BY MOUTH EVERY DAY IN THE EVENING 90 tablet 1   FLORASTOR 250 MG capsule Take 250 mg by mouth See admin instructions. Take 250 mg by mouth mid-morning and mid-afternoon (1600)     fluticasone-salmeterol (ADVAIR HFA) 230-21 MCG/ACT inhaler INHALE 2 PUFFS INTO THE LUNGS TWICE A DAY 12 each 5   guaiFENesin (ROBITUSSIN) 100 MG/5ML liquid Take 15 mLs by mouth every 6 (six) hours as needed for cough or to loosen phlegm. 120 mL 0   hydrocortisone (CORTEF) 10 MG tablet Take 1-2 tablets (10-20 mg total) by mouth See admin instructions. Take 20 mg in the am and 10mg  in the evening. Take after completing prednisone     lamoTRIgine (LAMICTAL) 200 MG tablet TAKE 1 TABLET BY MOUTH EVERYDAY AT BEDTIME 30 tablet 5   Lancets (ONETOUCH DELICA PLUS [id number]) MISC 3 (three) times daily. for testing     menthol-cetylpyridinium (CEPACOL) 3 MG lozenge Take 1 lozenge (3 mg total) by mouth as needed for sore throat. 100 tablet 0   nitroGLYCERIN (NITROSTAT) 0.4 MG SL tablet Dissolve 1 tablet under the tongue every 5 minutes as needed for chest pain. Max of 3 doses, then 911. 25 tablet 6   ONETOUCH VERIO test strip 3 (three) times daily. for testing     oxyCODONE-acetaminophen (PERCOCET/ROXICET) 5-325 MG tablet Take 1 tablet by mouth every 8 (eight) hours as needed for moderate pain or severe pain. 15 tablet 0   OXYGEN Inhale 4-5 L/min into the lungs continuous.     potassium chloride  SA (KLOR-CON M) 20 MEQ tablet Take 1 tablet (20 mEq total) by mouth daily for 3 days. 3 tablet 0   predniSONE (DELTASONE) 10 MG tablet Take 40mg  daily for 3days,Take 30mg  daily for 3days,Take 20mg  daily for 3days,Take 10mg  daily for 3days, then stop and switch to cortef 30 tablet 0   PROAIR HFA 108 (90 Base) MCG/ACT inhaler Inhale 2 puffs into the lungs every 4 (four) hours as needed for wheezing or shortness of breath.     Rimegepant Sulfate (NURTEC) 75 MG TBDP Take 75 mg by mouth daily as needed (Migraine).      rosuvastatin (CRESTOR) 10 MG tablet Take 10 mg by mouth daily. Mid morning     sacubitril-valsartan (ENTRESTO) 24-26 MG Take 1 tablet by mouth 2 (two) times daily. 60 tablet 11   silver sulfADIAZINE (SILVADENE) 1 % cream Apply to affected area daily 50 g 1   sitaGLIPtin (  JANUVIA) 100 MG tablet Take 1 tablet (100 mg total) by mouth daily.     Sodium Chloride Flush (NORMAL SALINE FLUSH) 0.9 % SOLN Use as directed for central venous catheter maintenance 600 mL 3   sucralfate (CARAFATE) 1 GM/10ML suspension Take 1 g by mouth daily as needed (as directed for ulcers).     SYNTHROID 100 MCG tablet Take 1 tablet (100 mcg total) by mouth every morning. (Patient taking differently: Take 100 mcg by mouth every morning. * Must be name brand*)     Tiotropium Bromide Monohydrate (SPIRIVA RESPIMAT) 1.25 MCG/ACT AERS Inhale 2 puffs into the lungs daily. 4 g 5   topiramate (TOPAMAX) 50 MG tablet Take 150 mg by mouth at bedtime.      zolpidem (AMBIEN CR) 12.5 MG CR tablet Take 12.5 mg by mouth at bedtime.     No current facility-administered medications for this visit.    Medication Side Effects: None  Allergies:  Allergies  Allergen Reactions   Ferrous Bisglycinate Chelate [Iron] Anaphylaxis and Other (See Comments)    Only IV - only FERRLICET   Mushroom Extract Complex Anaphylaxis   Na Ferric Gluc Cplx In Sucrose Anaphylaxis   Cymbalta [Duloxetine Hcl] Swelling and Anxiety   Hydromorphone Other  (See Comments)    BP drop and heart rate drops 5.1.20 PT REPORTS THAT SHE TAKES DILAUDID AT HOME   Ondansetron Hcl Other (See Comments)   Promethazine Other (See Comments)    Other reaction(s): Unknown   Succinylcholine Other (See Comments)    Lock Jaw   Buprenorphine Hcl Hives   Compazine Other (See Comments)    Altered mental status Aggression   Duloxetine     Other reaction(s): Not available   Metoclopramide Other (See Comments)    Dystonia   Morphine And Related Hives   Ondansetron Hives and Rash    Other reaction(s): Unknown Other reaction(s): Unknown   Promethazine Hcl Hives   Tegaderm Ag Mesh [Silver] Rash    Old formulation only, is able to tolerate new formulation    Past Medical History:  Diagnosis Date   Addison's disease ([place])    Adrenal insufficiency (HCC)    Anemia    Anxiety    Aortic stenosis    Aortic stenosis    Appendicitis 12/19/09   Appendicitis    Breast cancer (HCC)    STATUS POST BILATERAL MASTECTOMY. STATUS POST RECONSTRUCTION. 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/17/2017   Chronic respiratory failure with hypoxia (HCC) 12/23/2018   Cough variant asthma 04/13/2019   Depression    GERD (gastroesophageal reflux disease)    takes Dexilant and carafate and gi coctail    Headache    migraines on a daily and monthly regimen    Heart murmur    History of kidney stones    Hodgkin lymphoma ([place])    STATUS POST MANTLE RADIATION   Hodgkin's lymphoma ([place])    1987   Hypertension    Hypoxia    Necrotizing fasciitis ([place]) 12/23/2018   Non-ischemic cardiomyopathy ([place])    Osteoporosis    Palpitations    Pituitary adenoma ([place]) 12/23/2018   Pneumonia    PONV (postoperative nausea and vomiting)    Pre-diabetes    per pt; no meds   Pulmonary hypertension ([place]) 12/23/2018   Raynaud  phenomenon    Right heart failure ([place]) 04/17/2017   Seizures (HCC)    last febrile seizure was approx 3 weeks ago per report on 12/01/2020   Sleep apnea    upcoming sleep study per pt    Supplemental oxygen dependent    3 liters   SVT (supraventricular tachycardia)    Tachycardia    Thyroid cancer ([place])    STATUS POST SURGICAL REMOVAL-CURRENT ON THYROID REPLACEMENT    Family History  Family history unknown: Yes    Social History   Socioeconomic History   Marital status: Married    Spouse name: Not on file   Number of children: Not on file   Years of education: Not on file   Highest education level: Not on file  Occupational History   Not on file  Tobacco Use   Smoking status: Never   Smokeless tobacco: Never  Vaping Use   Vaping Use: Never used  Substance and Sexual Activity   Alcohol use: Not Currently    Comment: social    Drug use: No   Sexual activity: Yes    Birth control/protection: Surgical  Other Topics Concern   Not on file  Social History Narrative   Not on file   Social Determinants of Health   Financial Resource Strain: Not on file  Food Insecurity: No Food Insecurity (09/13/2022)   Hunger Vital Sign    Worried About Running Out of Food in the Last Year: Never true    Ran Out of Food in the Last Year: Never true  Transportation Needs: No Transportation Needs (09/13/2022)   PRAPARE - Hydrologist (Medical): No    Lack of Transportation (Non-Medical): No  Physical Activity: Not on file  Stress: Not on file  Social Connections: Not on file  Intimate Partner Violence: Not At Risk (09/13/2022)   Humiliation, Afraid, Rape, and Kick questionnaire    Fear of Current or Ex-Partner: No    Emotionally Abused: No    Physically Abused: No    Sexually Abused: No    Past Medical History, Surgical history, Social history, and Family history were reviewed and updated as appropriate.   Please see review of systems for further  details on the patient's review from today.   Objective:   Physical Exam:  LMP  (LMP Unknown)   Physical Exam Constitutional:      General: She is not in acute distress. Musculoskeletal:        General: No deformity.  Neurological:     Mental Status: She is alert and oriented to person, place, and time.     Coordination: Coordination normal.  Psychiatric:        Attention and Perception: Attention and perception normal. She does not perceive auditory or visual hallucinations.        Mood and Affect: Mood normal. Mood is not anxious or depressed. Affect is not labile, blunt, angry or inappropriate.        Speech: Speech normal.        Behavior: Behavior normal.        Thought Content: Thought content normal. Thought content is not paranoid or delusional. Thought content does not include homicidal or suicidal ideation. Thought content does not include homicidal or suicidal plan.        Cognition and Memory: Cognition and memory normal.        Judgment: Judgment normal.     Comments: Insight intact     Lab Review:  Component Value Date/Time   NA 139 09/13/2022 0427   NA 140 08/20/2022 1513   K 3.5 09/13/2022 0427   CL 104 09/13/2022 0427   CO2 25 09/13/2022 0427   GLUCOSE 83 09/13/2022 0427   BUN 26 (H) 09/13/2022 0427   BUN 28 (H) 08/20/2022 1513   CREATININE 1.41 (H) 09/13/2022 0427   CREATININE 1.08 (H) 02/09/2022 1202   CALCIUM 8.9 09/13/2022 0427   PROT 7.6 09/13/2022 0427   PROT 7.1 01/23/2018 1225   ALBUMIN 4.3 09/13/2022 0427   ALBUMIN 4.4 01/23/2018 1225   AST 25 09/13/2022 0427   AST 26 02/09/2022 1202   ALT 25 09/13/2022 0427   ALT 40 02/09/2022 1202   ALKPHOS 64 09/13/2022 0427   BILITOT 0.5 09/13/2022 0427   BILITOT 0.4 02/09/2022 1202   GFRNONAA 45 (L) 09/13/2022 0427   GFRNONAA >60 02/09/2022 1202   GFRAA >60 04/27/2020 0613   GFRAA >60 02/10/2020 1020       Component Value Date/Time   WBC 10.0 09/13/2022 0427   RBC 4.05 09/13/2022 0427    HGB 9.9 (L) 09/13/2022 0427   HGB 10.6 (L) 08/20/2022 1513   HCT 33.5 (L) 09/13/2022 0427   HCT 34.4 08/20/2022 1513   PLT 387 09/13/2022 0427   PLT 414 08/20/2022 1513   MCV 82.7 09/13/2022 0427   MCV 78 (L) 08/20/2022 1513   MCH 24.4 (L) 09/13/2022 0427   MCHC 29.6 (L) 09/13/2022 0427   RDW 18.1 (H) 09/13/2022 0427   RDW 16.5 (H) 08/20/2022 1513   LYMPHSABS 3.5 09/13/2022 0427   MONOABS 1.3 (H) 09/13/2022 0427   EOSABS 0.2 09/13/2022 0427   BASOSABS 0.1 09/13/2022 0427    No results found for: "POCLITH", "LITHIUM"   No results found for: "PHENYTOIN", "PHENOBARB", "VALPROATE", "CBMZ"   .res Assessment: Plan:    Plan:  PDMP reviewed  Lexapro 20mg  daily Clonazepam 0.5mg  TID Wellbutrin XL 150mg  every morning - denies seizure history Increased Lamictal 150mg  to 200mg  daily  Seeing Rinaldo Cloud therapy visits.  Discussed potential benefits, risk, and side effects of benzodiazepines to include potential risk of tolerance and dependence, as well as possible drowsiness.  Advised patient not to drive if experiencing drowsiness and to take lowest possible effective dose to minimize risk of dependence and tolerance.   Counseled patient regarding potential benefits, risks, and side effects of Lamictal to include potential risk of Stevens-Johnson syndrome. Advised patient to stop taking Lamictal and contact office immediately if rash develops and to seek urgent medical attention if rash is severe and/or spreading quickly. Set up with therapist  RTC 2 months  Patient advised to contact office with any questions, adverse effects, or acute worsening in signs and symptoms.  Diagnoses and all orders for this visit:  Major depressive disorder, recurrent episode, moderate (HCC) -     lamoTRIgine (LAMICTAL) 200 MG tablet; TAKE 1 TABLET BY MOUTH EVERYDAY AT BEDTIME  Generalized anxiety disorder  Panic attacks  Insomnia, unspecified type  Other orders -     buPROPion (WELLBUTRIN XL)  150 MG 24 hr tablet; Take 1 tablet (150 mg total) by mouth daily.     Please see After Visit Summary for patient specific instructions.  No future appointments.  No orders of the defined types were placed in this encounter.     -------------------------------

## 2022-12-25 DIAGNOSIS — I130 Hypertensive heart and chronic kidney disease with heart failure and stage 1 through stage 4 chronic kidney disease, or unspecified chronic kidney disease: Secondary | ICD-10-CM | POA: Diagnosis not present

## 2022-12-25 DIAGNOSIS — D352 Benign neoplasm of pituitary gland: Secondary | ICD-10-CM | POA: Diagnosis not present

## 2022-12-25 DIAGNOSIS — K219 Gastro-esophageal reflux disease without esophagitis: Secondary | ICD-10-CM | POA: Diagnosis not present

## 2022-12-25 DIAGNOSIS — Z8571 Personal history of Hodgkin lymphoma: Secondary | ICD-10-CM | POA: Diagnosis not present

## 2022-12-25 DIAGNOSIS — I428 Other cardiomyopathies: Secondary | ICD-10-CM | POA: Diagnosis not present

## 2022-12-25 DIAGNOSIS — I5030 Unspecified diastolic (congestive) heart failure: Secondary | ICD-10-CM | POA: Diagnosis not present

## 2022-12-25 DIAGNOSIS — E271 Primary adrenocortical insufficiency: Secondary | ICD-10-CM | POA: Diagnosis not present

## 2022-12-25 DIAGNOSIS — N189 Chronic kidney disease, unspecified: Secondary | ICD-10-CM | POA: Diagnosis not present

## 2022-12-25 DIAGNOSIS — Z8543 Personal history of malignant neoplasm of ovary: Secondary | ICD-10-CM | POA: Diagnosis not present

## 2022-12-25 DIAGNOSIS — E8770 Fluid overload, unspecified: Secondary | ICD-10-CM | POA: Diagnosis not present

## 2022-12-25 DIAGNOSIS — K209 Esophagitis, unspecified without bleeding: Secondary | ICD-10-CM | POA: Diagnosis not present

## 2022-12-25 DIAGNOSIS — Z8541 Personal history of malignant neoplasm of cervix uteri: Secondary | ICD-10-CM | POA: Diagnosis not present

## 2022-12-25 DIAGNOSIS — G8929 Other chronic pain: Secondary | ICD-10-CM | POA: Diagnosis not present

## 2022-12-25 DIAGNOSIS — R630 Anorexia: Secondary | ICD-10-CM | POA: Diagnosis not present

## 2022-12-25 DIAGNOSIS — J159 Unspecified bacterial pneumonia: Secondary | ICD-10-CM | POA: Diagnosis not present

## 2022-12-25 DIAGNOSIS — Z853 Personal history of malignant neoplasm of breast: Secondary | ICD-10-CM | POA: Diagnosis not present

## 2022-12-26 DIAGNOSIS — I428 Other cardiomyopathies: Secondary | ICD-10-CM | POA: Diagnosis not present

## 2022-12-26 DIAGNOSIS — Z8543 Personal history of malignant neoplasm of ovary: Secondary | ICD-10-CM | POA: Diagnosis not present

## 2022-12-26 DIAGNOSIS — R630 Anorexia: Secondary | ICD-10-CM | POA: Diagnosis not present

## 2022-12-26 DIAGNOSIS — N189 Chronic kidney disease, unspecified: Secondary | ICD-10-CM | POA: Diagnosis not present

## 2022-12-26 DIAGNOSIS — K209 Esophagitis, unspecified without bleeding: Secondary | ICD-10-CM | POA: Diagnosis not present

## 2022-12-26 DIAGNOSIS — Z853 Personal history of malignant neoplasm of breast: Secondary | ICD-10-CM | POA: Diagnosis not present

## 2022-12-26 DIAGNOSIS — I5030 Unspecified diastolic (congestive) heart failure: Secondary | ICD-10-CM | POA: Diagnosis not present

## 2022-12-26 DIAGNOSIS — I130 Hypertensive heart and chronic kidney disease with heart failure and stage 1 through stage 4 chronic kidney disease, or unspecified chronic kidney disease: Secondary | ICD-10-CM | POA: Diagnosis not present

## 2022-12-26 DIAGNOSIS — Z8571 Personal history of Hodgkin lymphoma: Secondary | ICD-10-CM | POA: Diagnosis not present

## 2022-12-26 DIAGNOSIS — Z8541 Personal history of malignant neoplasm of cervix uteri: Secondary | ICD-10-CM | POA: Diagnosis not present

## 2022-12-26 DIAGNOSIS — J159 Unspecified bacterial pneumonia: Secondary | ICD-10-CM | POA: Diagnosis not present

## 2022-12-26 DIAGNOSIS — D352 Benign neoplasm of pituitary gland: Secondary | ICD-10-CM | POA: Diagnosis not present

## 2022-12-26 DIAGNOSIS — E8770 Fluid overload, unspecified: Secondary | ICD-10-CM | POA: Diagnosis not present

## 2022-12-26 DIAGNOSIS — K219 Gastro-esophageal reflux disease without esophagitis: Secondary | ICD-10-CM | POA: Diagnosis not present

## 2022-12-26 DIAGNOSIS — E271 Primary adrenocortical insufficiency: Secondary | ICD-10-CM | POA: Diagnosis not present

## 2022-12-26 DIAGNOSIS — G8929 Other chronic pain: Secondary | ICD-10-CM | POA: Diagnosis not present

## 2022-12-27 DIAGNOSIS — N189 Chronic kidney disease, unspecified: Secondary | ICD-10-CM | POA: Diagnosis not present

## 2022-12-27 DIAGNOSIS — K219 Gastro-esophageal reflux disease without esophagitis: Secondary | ICD-10-CM | POA: Diagnosis not present

## 2022-12-27 DIAGNOSIS — E271 Primary adrenocortical insufficiency: Secondary | ICD-10-CM | POA: Diagnosis not present

## 2022-12-27 DIAGNOSIS — I428 Other cardiomyopathies: Secondary | ICD-10-CM | POA: Diagnosis not present

## 2022-12-27 DIAGNOSIS — I130 Hypertensive heart and chronic kidney disease with heart failure and stage 1 through stage 4 chronic kidney disease, or unspecified chronic kidney disease: Secondary | ICD-10-CM | POA: Diagnosis not present

## 2022-12-27 DIAGNOSIS — R911 Solitary pulmonary nodule: Secondary | ICD-10-CM | POA: Diagnosis not present

## 2022-12-27 DIAGNOSIS — R630 Anorexia: Secondary | ICD-10-CM | POA: Diagnosis not present

## 2022-12-27 DIAGNOSIS — I5030 Unspecified diastolic (congestive) heart failure: Secondary | ICD-10-CM | POA: Diagnosis not present

## 2022-12-27 DIAGNOSIS — D352 Benign neoplasm of pituitary gland: Secondary | ICD-10-CM | POA: Diagnosis not present

## 2022-12-27 DIAGNOSIS — E8770 Fluid overload, unspecified: Secondary | ICD-10-CM | POA: Diagnosis not present

## 2022-12-27 DIAGNOSIS — Z853 Personal history of malignant neoplasm of breast: Secondary | ICD-10-CM | POA: Diagnosis not present

## 2022-12-27 DIAGNOSIS — Z8571 Personal history of Hodgkin lymphoma: Secondary | ICD-10-CM | POA: Diagnosis not present

## 2022-12-27 DIAGNOSIS — J159 Unspecified bacterial pneumonia: Secondary | ICD-10-CM | POA: Diagnosis not present

## 2022-12-27 DIAGNOSIS — G8929 Other chronic pain: Secondary | ICD-10-CM | POA: Diagnosis not present

## 2022-12-27 DIAGNOSIS — Z8541 Personal history of malignant neoplasm of cervix uteri: Secondary | ICD-10-CM | POA: Diagnosis not present

## 2022-12-27 DIAGNOSIS — Z8543 Personal history of malignant neoplasm of ovary: Secondary | ICD-10-CM | POA: Diagnosis not present

## 2022-12-27 DIAGNOSIS — K209 Esophagitis, unspecified without bleeding: Secondary | ICD-10-CM | POA: Diagnosis not present

## 2022-12-28 ENCOUNTER — Other Ambulatory Visit (HOSPITAL_COMMUNITY): Payer: Self-pay

## 2022-12-28 DIAGNOSIS — Z8543 Personal history of malignant neoplasm of ovary: Secondary | ICD-10-CM | POA: Diagnosis not present

## 2022-12-28 DIAGNOSIS — K209 Esophagitis, unspecified without bleeding: Secondary | ICD-10-CM | POA: Diagnosis not present

## 2022-12-28 DIAGNOSIS — G8929 Other chronic pain: Secondary | ICD-10-CM | POA: Diagnosis not present

## 2022-12-28 DIAGNOSIS — Z8571 Personal history of Hodgkin lymphoma: Secondary | ICD-10-CM | POA: Diagnosis not present

## 2022-12-28 DIAGNOSIS — Z853 Personal history of malignant neoplasm of breast: Secondary | ICD-10-CM | POA: Diagnosis not present

## 2022-12-28 DIAGNOSIS — K219 Gastro-esophageal reflux disease without esophagitis: Secondary | ICD-10-CM | POA: Diagnosis not present

## 2022-12-28 DIAGNOSIS — Z8541 Personal history of malignant neoplasm of cervix uteri: Secondary | ICD-10-CM | POA: Diagnosis not present

## 2022-12-28 DIAGNOSIS — J159 Unspecified bacterial pneumonia: Secondary | ICD-10-CM | POA: Diagnosis not present

## 2022-12-28 DIAGNOSIS — I130 Hypertensive heart and chronic kidney disease with heart failure and stage 1 through stage 4 chronic kidney disease, or unspecified chronic kidney disease: Secondary | ICD-10-CM | POA: Diagnosis not present

## 2022-12-28 DIAGNOSIS — I428 Other cardiomyopathies: Secondary | ICD-10-CM | POA: Diagnosis not present

## 2022-12-28 DIAGNOSIS — N189 Chronic kidney disease, unspecified: Secondary | ICD-10-CM | POA: Diagnosis not present

## 2022-12-28 DIAGNOSIS — E8770 Fluid overload, unspecified: Secondary | ICD-10-CM | POA: Diagnosis not present

## 2022-12-28 DIAGNOSIS — E271 Primary adrenocortical insufficiency: Secondary | ICD-10-CM | POA: Diagnosis not present

## 2022-12-28 DIAGNOSIS — D352 Benign neoplasm of pituitary gland: Secondary | ICD-10-CM | POA: Diagnosis not present

## 2022-12-28 DIAGNOSIS — I5030 Unspecified diastolic (congestive) heart failure: Secondary | ICD-10-CM | POA: Diagnosis not present

## 2022-12-28 DIAGNOSIS — R630 Anorexia: Secondary | ICD-10-CM | POA: Diagnosis not present

## 2022-12-28 MED ORDER — HYDROMORPHONE HCL 4 MG PO TABS
4.0000 mg | ORAL_TABLET | ORAL | 0 refills | Status: DC | PRN
  Filled 2022-12-28: qty 90, 15d supply, fill #0

## 2022-12-28 MED ORDER — LORAZEPAM 0.5 MG PO TABS
0.5000 mg | ORAL_TABLET | Freq: Four times a day (QID) | ORAL | 0 refills | Status: DC | PRN
  Filled 2022-12-28: qty 60, 15d supply, fill #0

## 2022-12-29 DIAGNOSIS — J159 Unspecified bacterial pneumonia: Secondary | ICD-10-CM | POA: Diagnosis not present

## 2022-12-29 DIAGNOSIS — N189 Chronic kidney disease, unspecified: Secondary | ICD-10-CM | POA: Diagnosis not present

## 2022-12-29 DIAGNOSIS — G8929 Other chronic pain: Secondary | ICD-10-CM | POA: Diagnosis not present

## 2022-12-29 DIAGNOSIS — K219 Gastro-esophageal reflux disease without esophagitis: Secondary | ICD-10-CM | POA: Diagnosis not present

## 2022-12-29 DIAGNOSIS — Z8541 Personal history of malignant neoplasm of cervix uteri: Secondary | ICD-10-CM | POA: Diagnosis not present

## 2022-12-29 DIAGNOSIS — K209 Esophagitis, unspecified without bleeding: Secondary | ICD-10-CM | POA: Diagnosis not present

## 2022-12-29 DIAGNOSIS — E271 Primary adrenocortical insufficiency: Secondary | ICD-10-CM | POA: Diagnosis not present

## 2022-12-29 DIAGNOSIS — I5030 Unspecified diastolic (congestive) heart failure: Secondary | ICD-10-CM | POA: Diagnosis not present

## 2022-12-29 DIAGNOSIS — I428 Other cardiomyopathies: Secondary | ICD-10-CM | POA: Diagnosis not present

## 2022-12-29 DIAGNOSIS — Z853 Personal history of malignant neoplasm of breast: Secondary | ICD-10-CM | POA: Diagnosis not present

## 2022-12-29 DIAGNOSIS — I130 Hypertensive heart and chronic kidney disease with heart failure and stage 1 through stage 4 chronic kidney disease, or unspecified chronic kidney disease: Secondary | ICD-10-CM | POA: Diagnosis not present

## 2022-12-29 DIAGNOSIS — Z8571 Personal history of Hodgkin lymphoma: Secondary | ICD-10-CM | POA: Diagnosis not present

## 2022-12-29 DIAGNOSIS — Z8543 Personal history of malignant neoplasm of ovary: Secondary | ICD-10-CM | POA: Diagnosis not present

## 2022-12-29 DIAGNOSIS — E8770 Fluid overload, unspecified: Secondary | ICD-10-CM | POA: Diagnosis not present

## 2022-12-29 DIAGNOSIS — D352 Benign neoplasm of pituitary gland: Secondary | ICD-10-CM | POA: Diagnosis not present

## 2022-12-29 DIAGNOSIS — R630 Anorexia: Secondary | ICD-10-CM | POA: Diagnosis not present

## 2022-12-30 DIAGNOSIS — I130 Hypertensive heart and chronic kidney disease with heart failure and stage 1 through stage 4 chronic kidney disease, or unspecified chronic kidney disease: Secondary | ICD-10-CM | POA: Diagnosis not present

## 2022-12-30 DIAGNOSIS — E8770 Fluid overload, unspecified: Secondary | ICD-10-CM | POA: Diagnosis not present

## 2022-12-30 DIAGNOSIS — Z8571 Personal history of Hodgkin lymphoma: Secondary | ICD-10-CM | POA: Diagnosis not present

## 2022-12-30 DIAGNOSIS — K209 Esophagitis, unspecified without bleeding: Secondary | ICD-10-CM | POA: Diagnosis not present

## 2022-12-30 DIAGNOSIS — Z853 Personal history of malignant neoplasm of breast: Secondary | ICD-10-CM | POA: Diagnosis not present

## 2022-12-30 DIAGNOSIS — Z8543 Personal history of malignant neoplasm of ovary: Secondary | ICD-10-CM | POA: Diagnosis not present

## 2022-12-30 DIAGNOSIS — R630 Anorexia: Secondary | ICD-10-CM | POA: Diagnosis not present

## 2022-12-30 DIAGNOSIS — Z8541 Personal history of malignant neoplasm of cervix uteri: Secondary | ICD-10-CM | POA: Diagnosis not present

## 2022-12-30 DIAGNOSIS — N189 Chronic kidney disease, unspecified: Secondary | ICD-10-CM | POA: Diagnosis not present

## 2022-12-30 DIAGNOSIS — I428 Other cardiomyopathies: Secondary | ICD-10-CM | POA: Diagnosis not present

## 2022-12-30 DIAGNOSIS — I5030 Unspecified diastolic (congestive) heart failure: Secondary | ICD-10-CM | POA: Diagnosis not present

## 2022-12-30 DIAGNOSIS — K219 Gastro-esophageal reflux disease without esophagitis: Secondary | ICD-10-CM | POA: Diagnosis not present

## 2022-12-30 DIAGNOSIS — G8929 Other chronic pain: Secondary | ICD-10-CM | POA: Diagnosis not present

## 2022-12-30 DIAGNOSIS — D352 Benign neoplasm of pituitary gland: Secondary | ICD-10-CM | POA: Diagnosis not present

## 2022-12-30 DIAGNOSIS — J159 Unspecified bacterial pneumonia: Secondary | ICD-10-CM | POA: Diagnosis not present

## 2022-12-30 DIAGNOSIS — E271 Primary adrenocortical insufficiency: Secondary | ICD-10-CM | POA: Diagnosis not present

## 2022-12-31 ENCOUNTER — Other Ambulatory Visit (HOSPITAL_COMMUNITY): Payer: Self-pay

## 2022-12-31 DIAGNOSIS — Z8543 Personal history of malignant neoplasm of ovary: Secondary | ICD-10-CM | POA: Diagnosis not present

## 2022-12-31 DIAGNOSIS — J159 Unspecified bacterial pneumonia: Secondary | ICD-10-CM | POA: Diagnosis not present

## 2022-12-31 DIAGNOSIS — E8770 Fluid overload, unspecified: Secondary | ICD-10-CM | POA: Diagnosis not present

## 2022-12-31 DIAGNOSIS — I130 Hypertensive heart and chronic kidney disease with heart failure and stage 1 through stage 4 chronic kidney disease, or unspecified chronic kidney disease: Secondary | ICD-10-CM | POA: Diagnosis not present

## 2022-12-31 DIAGNOSIS — R630 Anorexia: Secondary | ICD-10-CM | POA: Diagnosis not present

## 2022-12-31 DIAGNOSIS — Z853 Personal history of malignant neoplasm of breast: Secondary | ICD-10-CM | POA: Diagnosis not present

## 2022-12-31 DIAGNOSIS — K209 Esophagitis, unspecified without bleeding: Secondary | ICD-10-CM | POA: Diagnosis not present

## 2022-12-31 DIAGNOSIS — Z8571 Personal history of Hodgkin lymphoma: Secondary | ICD-10-CM | POA: Diagnosis not present

## 2022-12-31 DIAGNOSIS — E271 Primary adrenocortical insufficiency: Secondary | ICD-10-CM | POA: Diagnosis not present

## 2022-12-31 DIAGNOSIS — G8929 Other chronic pain: Secondary | ICD-10-CM | POA: Diagnosis not present

## 2022-12-31 DIAGNOSIS — D352 Benign neoplasm of pituitary gland: Secondary | ICD-10-CM | POA: Diagnosis not present

## 2022-12-31 DIAGNOSIS — Z8541 Personal history of malignant neoplasm of cervix uteri: Secondary | ICD-10-CM | POA: Diagnosis not present

## 2022-12-31 DIAGNOSIS — I5030 Unspecified diastolic (congestive) heart failure: Secondary | ICD-10-CM | POA: Diagnosis not present

## 2022-12-31 DIAGNOSIS — N189 Chronic kidney disease, unspecified: Secondary | ICD-10-CM | POA: Diagnosis not present

## 2022-12-31 DIAGNOSIS — I428 Other cardiomyopathies: Secondary | ICD-10-CM | POA: Diagnosis not present

## 2022-12-31 DIAGNOSIS — K219 Gastro-esophageal reflux disease without esophagitis: Secondary | ICD-10-CM | POA: Diagnosis not present

## 2023-01-01 DIAGNOSIS — K209 Esophagitis, unspecified without bleeding: Secondary | ICD-10-CM | POA: Diagnosis not present

## 2023-01-01 DIAGNOSIS — I5030 Unspecified diastolic (congestive) heart failure: Secondary | ICD-10-CM | POA: Diagnosis not present

## 2023-01-01 DIAGNOSIS — K219 Gastro-esophageal reflux disease without esophagitis: Secondary | ICD-10-CM | POA: Diagnosis not present

## 2023-01-01 DIAGNOSIS — Z8571 Personal history of Hodgkin lymphoma: Secondary | ICD-10-CM | POA: Diagnosis not present

## 2023-01-01 DIAGNOSIS — Z8543 Personal history of malignant neoplasm of ovary: Secondary | ICD-10-CM | POA: Diagnosis not present

## 2023-01-01 DIAGNOSIS — R630 Anorexia: Secondary | ICD-10-CM | POA: Diagnosis not present

## 2023-01-01 DIAGNOSIS — D352 Benign neoplasm of pituitary gland: Secondary | ICD-10-CM | POA: Diagnosis not present

## 2023-01-01 DIAGNOSIS — J159 Unspecified bacterial pneumonia: Secondary | ICD-10-CM | POA: Diagnosis not present

## 2023-01-01 DIAGNOSIS — I130 Hypertensive heart and chronic kidney disease with heart failure and stage 1 through stage 4 chronic kidney disease, or unspecified chronic kidney disease: Secondary | ICD-10-CM | POA: Diagnosis not present

## 2023-01-01 DIAGNOSIS — E8770 Fluid overload, unspecified: Secondary | ICD-10-CM | POA: Diagnosis not present

## 2023-01-01 DIAGNOSIS — Z8541 Personal history of malignant neoplasm of cervix uteri: Secondary | ICD-10-CM | POA: Diagnosis not present

## 2023-01-01 DIAGNOSIS — E271 Primary adrenocortical insufficiency: Secondary | ICD-10-CM | POA: Diagnosis not present

## 2023-01-01 DIAGNOSIS — Z853 Personal history of malignant neoplasm of breast: Secondary | ICD-10-CM | POA: Diagnosis not present

## 2023-01-01 DIAGNOSIS — I428 Other cardiomyopathies: Secondary | ICD-10-CM | POA: Diagnosis not present

## 2023-01-01 DIAGNOSIS — N189 Chronic kidney disease, unspecified: Secondary | ICD-10-CM | POA: Diagnosis not present

## 2023-01-01 DIAGNOSIS — G8929 Other chronic pain: Secondary | ICD-10-CM | POA: Diagnosis not present

## 2023-01-02 DIAGNOSIS — R630 Anorexia: Secondary | ICD-10-CM | POA: Diagnosis not present

## 2023-01-02 DIAGNOSIS — K209 Esophagitis, unspecified without bleeding: Secondary | ICD-10-CM | POA: Diagnosis not present

## 2023-01-02 DIAGNOSIS — Z8541 Personal history of malignant neoplasm of cervix uteri: Secondary | ICD-10-CM | POA: Diagnosis not present

## 2023-01-02 DIAGNOSIS — N189 Chronic kidney disease, unspecified: Secondary | ICD-10-CM | POA: Diagnosis not present

## 2023-01-02 DIAGNOSIS — I130 Hypertensive heart and chronic kidney disease with heart failure and stage 1 through stage 4 chronic kidney disease, or unspecified chronic kidney disease: Secondary | ICD-10-CM | POA: Diagnosis not present

## 2023-01-02 DIAGNOSIS — E8770 Fluid overload, unspecified: Secondary | ICD-10-CM | POA: Diagnosis not present

## 2023-01-02 DIAGNOSIS — D352 Benign neoplasm of pituitary gland: Secondary | ICD-10-CM | POA: Diagnosis not present

## 2023-01-02 DIAGNOSIS — Z8543 Personal history of malignant neoplasm of ovary: Secondary | ICD-10-CM | POA: Diagnosis not present

## 2023-01-02 DIAGNOSIS — G8929 Other chronic pain: Secondary | ICD-10-CM | POA: Diagnosis not present

## 2023-01-02 DIAGNOSIS — Z853 Personal history of malignant neoplasm of breast: Secondary | ICD-10-CM | POA: Diagnosis not present

## 2023-01-02 DIAGNOSIS — I5030 Unspecified diastolic (congestive) heart failure: Secondary | ICD-10-CM | POA: Diagnosis not present

## 2023-01-02 DIAGNOSIS — Z8571 Personal history of Hodgkin lymphoma: Secondary | ICD-10-CM | POA: Diagnosis not present

## 2023-01-02 DIAGNOSIS — E271 Primary adrenocortical insufficiency: Secondary | ICD-10-CM | POA: Diagnosis not present

## 2023-01-02 DIAGNOSIS — K219 Gastro-esophageal reflux disease without esophagitis: Secondary | ICD-10-CM | POA: Diagnosis not present

## 2023-01-02 DIAGNOSIS — J159 Unspecified bacterial pneumonia: Secondary | ICD-10-CM | POA: Diagnosis not present

## 2023-01-02 DIAGNOSIS — I428 Other cardiomyopathies: Secondary | ICD-10-CM | POA: Diagnosis not present

## 2023-01-03 ENCOUNTER — Other Ambulatory Visit (HOSPITAL_COMMUNITY): Payer: Self-pay

## 2023-01-03 ENCOUNTER — Encounter: Payer: Self-pay | Admitting: Hematology and Oncology

## 2023-01-03 DIAGNOSIS — I5030 Unspecified diastolic (congestive) heart failure: Secondary | ICD-10-CM | POA: Diagnosis not present

## 2023-01-03 DIAGNOSIS — K219 Gastro-esophageal reflux disease without esophagitis: Secondary | ICD-10-CM | POA: Diagnosis not present

## 2023-01-03 DIAGNOSIS — R630 Anorexia: Secondary | ICD-10-CM | POA: Diagnosis not present

## 2023-01-03 DIAGNOSIS — E271 Primary adrenocortical insufficiency: Secondary | ICD-10-CM | POA: Diagnosis not present

## 2023-01-03 DIAGNOSIS — N189 Chronic kidney disease, unspecified: Secondary | ICD-10-CM | POA: Diagnosis not present

## 2023-01-03 DIAGNOSIS — E8770 Fluid overload, unspecified: Secondary | ICD-10-CM | POA: Diagnosis not present

## 2023-01-03 DIAGNOSIS — J159 Unspecified bacterial pneumonia: Secondary | ICD-10-CM | POA: Diagnosis not present

## 2023-01-03 DIAGNOSIS — Z8571 Personal history of Hodgkin lymphoma: Secondary | ICD-10-CM | POA: Diagnosis not present

## 2023-01-03 DIAGNOSIS — I428 Other cardiomyopathies: Secondary | ICD-10-CM | POA: Diagnosis not present

## 2023-01-03 DIAGNOSIS — D352 Benign neoplasm of pituitary gland: Secondary | ICD-10-CM | POA: Diagnosis not present

## 2023-01-03 DIAGNOSIS — I130 Hypertensive heart and chronic kidney disease with heart failure and stage 1 through stage 4 chronic kidney disease, or unspecified chronic kidney disease: Secondary | ICD-10-CM | POA: Diagnosis not present

## 2023-01-03 DIAGNOSIS — Z8541 Personal history of malignant neoplasm of cervix uteri: Secondary | ICD-10-CM | POA: Diagnosis not present

## 2023-01-03 DIAGNOSIS — G8929 Other chronic pain: Secondary | ICD-10-CM | POA: Diagnosis not present

## 2023-01-03 DIAGNOSIS — K209 Esophagitis, unspecified without bleeding: Secondary | ICD-10-CM | POA: Diagnosis not present

## 2023-01-03 DIAGNOSIS — Z8543 Personal history of malignant neoplasm of ovary: Secondary | ICD-10-CM | POA: Diagnosis not present

## 2023-01-03 DIAGNOSIS — Z853 Personal history of malignant neoplasm of breast: Secondary | ICD-10-CM | POA: Diagnosis not present

## 2023-01-04 DIAGNOSIS — Z853 Personal history of malignant neoplasm of breast: Secondary | ICD-10-CM | POA: Diagnosis not present

## 2023-01-04 DIAGNOSIS — I130 Hypertensive heart and chronic kidney disease with heart failure and stage 1 through stage 4 chronic kidney disease, or unspecified chronic kidney disease: Secondary | ICD-10-CM | POA: Diagnosis not present

## 2023-01-04 DIAGNOSIS — R630 Anorexia: Secondary | ICD-10-CM | POA: Diagnosis not present

## 2023-01-04 DIAGNOSIS — Z8543 Personal history of malignant neoplasm of ovary: Secondary | ICD-10-CM | POA: Diagnosis not present

## 2023-01-04 DIAGNOSIS — I428 Other cardiomyopathies: Secondary | ICD-10-CM | POA: Diagnosis not present

## 2023-01-04 DIAGNOSIS — N189 Chronic kidney disease, unspecified: Secondary | ICD-10-CM | POA: Diagnosis not present

## 2023-01-04 DIAGNOSIS — K219 Gastro-esophageal reflux disease without esophagitis: Secondary | ICD-10-CM | POA: Diagnosis not present

## 2023-01-04 DIAGNOSIS — J159 Unspecified bacterial pneumonia: Secondary | ICD-10-CM | POA: Diagnosis not present

## 2023-01-04 DIAGNOSIS — K209 Esophagitis, unspecified without bleeding: Secondary | ICD-10-CM | POA: Diagnosis not present

## 2023-01-04 DIAGNOSIS — E271 Primary adrenocortical insufficiency: Secondary | ICD-10-CM | POA: Diagnosis not present

## 2023-01-04 DIAGNOSIS — Z8571 Personal history of Hodgkin lymphoma: Secondary | ICD-10-CM | POA: Diagnosis not present

## 2023-01-04 DIAGNOSIS — E8770 Fluid overload, unspecified: Secondary | ICD-10-CM | POA: Diagnosis not present

## 2023-01-04 DIAGNOSIS — Z8541 Personal history of malignant neoplasm of cervix uteri: Secondary | ICD-10-CM | POA: Diagnosis not present

## 2023-01-04 DIAGNOSIS — D352 Benign neoplasm of pituitary gland: Secondary | ICD-10-CM | POA: Diagnosis not present

## 2023-01-04 DIAGNOSIS — I5030 Unspecified diastolic (congestive) heart failure: Secondary | ICD-10-CM | POA: Diagnosis not present

## 2023-01-04 DIAGNOSIS — G8929 Other chronic pain: Secondary | ICD-10-CM | POA: Diagnosis not present

## 2023-01-05 DIAGNOSIS — G8929 Other chronic pain: Secondary | ICD-10-CM | POA: Diagnosis not present

## 2023-01-05 DIAGNOSIS — J159 Unspecified bacterial pneumonia: Secondary | ICD-10-CM | POA: Diagnosis not present

## 2023-01-05 DIAGNOSIS — Z8543 Personal history of malignant neoplasm of ovary: Secondary | ICD-10-CM | POA: Diagnosis not present

## 2023-01-05 DIAGNOSIS — I130 Hypertensive heart and chronic kidney disease with heart failure and stage 1 through stage 4 chronic kidney disease, or unspecified chronic kidney disease: Secondary | ICD-10-CM | POA: Diagnosis not present

## 2023-01-05 DIAGNOSIS — I428 Other cardiomyopathies: Secondary | ICD-10-CM | POA: Diagnosis not present

## 2023-01-05 DIAGNOSIS — I5030 Unspecified diastolic (congestive) heart failure: Secondary | ICD-10-CM | POA: Diagnosis not present

## 2023-01-05 DIAGNOSIS — D352 Benign neoplasm of pituitary gland: Secondary | ICD-10-CM | POA: Diagnosis not present

## 2023-01-05 DIAGNOSIS — N189 Chronic kidney disease, unspecified: Secondary | ICD-10-CM | POA: Diagnosis not present

## 2023-01-05 DIAGNOSIS — Z8571 Personal history of Hodgkin lymphoma: Secondary | ICD-10-CM | POA: Diagnosis not present

## 2023-01-05 DIAGNOSIS — R630 Anorexia: Secondary | ICD-10-CM | POA: Diagnosis not present

## 2023-01-05 DIAGNOSIS — Z8541 Personal history of malignant neoplasm of cervix uteri: Secondary | ICD-10-CM | POA: Diagnosis not present

## 2023-01-05 DIAGNOSIS — K219 Gastro-esophageal reflux disease without esophagitis: Secondary | ICD-10-CM | POA: Diagnosis not present

## 2023-01-05 DIAGNOSIS — Z853 Personal history of malignant neoplasm of breast: Secondary | ICD-10-CM | POA: Diagnosis not present

## 2023-01-05 DIAGNOSIS — E8770 Fluid overload, unspecified: Secondary | ICD-10-CM | POA: Diagnosis not present

## 2023-01-05 DIAGNOSIS — E271 Primary adrenocortical insufficiency: Secondary | ICD-10-CM | POA: Diagnosis not present

## 2023-01-05 DIAGNOSIS — K209 Esophagitis, unspecified without bleeding: Secondary | ICD-10-CM | POA: Diagnosis not present

## 2023-01-06 DIAGNOSIS — E8770 Fluid overload, unspecified: Secondary | ICD-10-CM | POA: Diagnosis not present

## 2023-01-06 DIAGNOSIS — J159 Unspecified bacterial pneumonia: Secondary | ICD-10-CM | POA: Diagnosis not present

## 2023-01-06 DIAGNOSIS — I428 Other cardiomyopathies: Secondary | ICD-10-CM | POA: Diagnosis not present

## 2023-01-06 DIAGNOSIS — Z8571 Personal history of Hodgkin lymphoma: Secondary | ICD-10-CM | POA: Diagnosis not present

## 2023-01-06 DIAGNOSIS — Z853 Personal history of malignant neoplasm of breast: Secondary | ICD-10-CM | POA: Diagnosis not present

## 2023-01-06 DIAGNOSIS — R630 Anorexia: Secondary | ICD-10-CM | POA: Diagnosis not present

## 2023-01-06 DIAGNOSIS — D352 Benign neoplasm of pituitary gland: Secondary | ICD-10-CM | POA: Diagnosis not present

## 2023-01-06 DIAGNOSIS — E271 Primary adrenocortical insufficiency: Secondary | ICD-10-CM | POA: Diagnosis not present

## 2023-01-06 DIAGNOSIS — I5030 Unspecified diastolic (congestive) heart failure: Secondary | ICD-10-CM | POA: Diagnosis not present

## 2023-01-06 DIAGNOSIS — N189 Chronic kidney disease, unspecified: Secondary | ICD-10-CM | POA: Diagnosis not present

## 2023-01-06 DIAGNOSIS — Z8541 Personal history of malignant neoplasm of cervix uteri: Secondary | ICD-10-CM | POA: Diagnosis not present

## 2023-01-06 DIAGNOSIS — K209 Esophagitis, unspecified without bleeding: Secondary | ICD-10-CM | POA: Diagnosis not present

## 2023-01-06 DIAGNOSIS — I130 Hypertensive heart and chronic kidney disease with heart failure and stage 1 through stage 4 chronic kidney disease, or unspecified chronic kidney disease: Secondary | ICD-10-CM | POA: Diagnosis not present

## 2023-01-06 DIAGNOSIS — G8929 Other chronic pain: Secondary | ICD-10-CM | POA: Diagnosis not present

## 2023-01-06 DIAGNOSIS — K219 Gastro-esophageal reflux disease without esophagitis: Secondary | ICD-10-CM | POA: Diagnosis not present

## 2023-01-06 DIAGNOSIS — Z8543 Personal history of malignant neoplasm of ovary: Secondary | ICD-10-CM | POA: Diagnosis not present

## 2023-01-07 ENCOUNTER — Ambulatory Visit: Payer: Medicare Other | Admitting: Psychiatry

## 2023-01-07 ENCOUNTER — Encounter: Payer: Self-pay | Admitting: Hematology and Oncology

## 2023-01-07 DIAGNOSIS — G8929 Other chronic pain: Secondary | ICD-10-CM | POA: Diagnosis not present

## 2023-01-07 DIAGNOSIS — Z8571 Personal history of Hodgkin lymphoma: Secondary | ICD-10-CM | POA: Diagnosis not present

## 2023-01-07 DIAGNOSIS — I428 Other cardiomyopathies: Secondary | ICD-10-CM | POA: Diagnosis not present

## 2023-01-07 DIAGNOSIS — I5030 Unspecified diastolic (congestive) heart failure: Secondary | ICD-10-CM | POA: Diagnosis not present

## 2023-01-07 DIAGNOSIS — N189 Chronic kidney disease, unspecified: Secondary | ICD-10-CM | POA: Diagnosis not present

## 2023-01-07 DIAGNOSIS — K209 Esophagitis, unspecified without bleeding: Secondary | ICD-10-CM | POA: Diagnosis not present

## 2023-01-07 DIAGNOSIS — I130 Hypertensive heart and chronic kidney disease with heart failure and stage 1 through stage 4 chronic kidney disease, or unspecified chronic kidney disease: Secondary | ICD-10-CM | POA: Diagnosis not present

## 2023-01-07 DIAGNOSIS — D352 Benign neoplasm of pituitary gland: Secondary | ICD-10-CM | POA: Diagnosis not present

## 2023-01-07 DIAGNOSIS — E271 Primary adrenocortical insufficiency: Secondary | ICD-10-CM | POA: Diagnosis not present

## 2023-01-07 DIAGNOSIS — J159 Unspecified bacterial pneumonia: Secondary | ICD-10-CM | POA: Diagnosis not present

## 2023-01-07 DIAGNOSIS — Z853 Personal history of malignant neoplasm of breast: Secondary | ICD-10-CM | POA: Diagnosis not present

## 2023-01-07 DIAGNOSIS — Z8543 Personal history of malignant neoplasm of ovary: Secondary | ICD-10-CM | POA: Diagnosis not present

## 2023-01-07 DIAGNOSIS — E8770 Fluid overload, unspecified: Secondary | ICD-10-CM | POA: Diagnosis not present

## 2023-01-07 DIAGNOSIS — K219 Gastro-esophageal reflux disease without esophagitis: Secondary | ICD-10-CM | POA: Diagnosis not present

## 2023-01-07 DIAGNOSIS — Z8541 Personal history of malignant neoplasm of cervix uteri: Secondary | ICD-10-CM | POA: Diagnosis not present

## 2023-01-07 DIAGNOSIS — R630 Anorexia: Secondary | ICD-10-CM | POA: Diagnosis not present

## 2023-01-08 ENCOUNTER — Telehealth: Payer: Self-pay | Admitting: Pulmonary Disease

## 2023-01-08 DIAGNOSIS — E8770 Fluid overload, unspecified: Secondary | ICD-10-CM | POA: Diagnosis not present

## 2023-01-08 DIAGNOSIS — J159 Unspecified bacterial pneumonia: Secondary | ICD-10-CM | POA: Diagnosis not present

## 2023-01-08 DIAGNOSIS — Z8543 Personal history of malignant neoplasm of ovary: Secondary | ICD-10-CM | POA: Diagnosis not present

## 2023-01-08 DIAGNOSIS — Z853 Personal history of malignant neoplasm of breast: Secondary | ICD-10-CM | POA: Diagnosis not present

## 2023-01-08 DIAGNOSIS — N189 Chronic kidney disease, unspecified: Secondary | ICD-10-CM | POA: Diagnosis not present

## 2023-01-08 DIAGNOSIS — Z8541 Personal history of malignant neoplasm of cervix uteri: Secondary | ICD-10-CM | POA: Diagnosis not present

## 2023-01-08 DIAGNOSIS — I428 Other cardiomyopathies: Secondary | ICD-10-CM | POA: Diagnosis not present

## 2023-01-08 DIAGNOSIS — E271 Primary adrenocortical insufficiency: Secondary | ICD-10-CM | POA: Diagnosis not present

## 2023-01-08 DIAGNOSIS — G8929 Other chronic pain: Secondary | ICD-10-CM | POA: Diagnosis not present

## 2023-01-08 DIAGNOSIS — Z8571 Personal history of Hodgkin lymphoma: Secondary | ICD-10-CM | POA: Diagnosis not present

## 2023-01-08 DIAGNOSIS — D352 Benign neoplasm of pituitary gland: Secondary | ICD-10-CM | POA: Diagnosis not present

## 2023-01-08 DIAGNOSIS — I5030 Unspecified diastolic (congestive) heart failure: Secondary | ICD-10-CM | POA: Diagnosis not present

## 2023-01-08 DIAGNOSIS — K209 Esophagitis, unspecified without bleeding: Secondary | ICD-10-CM | POA: Diagnosis not present

## 2023-01-08 DIAGNOSIS — I130 Hypertensive heart and chronic kidney disease with heart failure and stage 1 through stage 4 chronic kidney disease, or unspecified chronic kidney disease: Secondary | ICD-10-CM | POA: Diagnosis not present

## 2023-01-08 DIAGNOSIS — K219 Gastro-esophageal reflux disease without esophagitis: Secondary | ICD-10-CM | POA: Diagnosis not present

## 2023-01-08 DIAGNOSIS — R630 Anorexia: Secondary | ICD-10-CM | POA: Diagnosis not present

## 2023-01-08 NOTE — Telephone Encounter (Signed)
Dr. Barbee Shropshire would like to speak to Dr. Everardo All. Dr [facility] phone number is [phone].

## 2023-01-08 NOTE — Telephone Encounter (Signed)
Routing to Dr. Ellison as an FYI. 

## 2023-01-09 DIAGNOSIS — Z8571 Personal history of Hodgkin lymphoma: Secondary | ICD-10-CM | POA: Diagnosis not present

## 2023-01-09 DIAGNOSIS — E8770 Fluid overload, unspecified: Secondary | ICD-10-CM | POA: Diagnosis not present

## 2023-01-09 DIAGNOSIS — N189 Chronic kidney disease, unspecified: Secondary | ICD-10-CM | POA: Diagnosis not present

## 2023-01-09 DIAGNOSIS — Z853 Personal history of malignant neoplasm of breast: Secondary | ICD-10-CM | POA: Diagnosis not present

## 2023-01-09 DIAGNOSIS — Z8541 Personal history of malignant neoplasm of cervix uteri: Secondary | ICD-10-CM | POA: Diagnosis not present

## 2023-01-09 DIAGNOSIS — E271 Primary adrenocortical insufficiency: Secondary | ICD-10-CM | POA: Diagnosis not present

## 2023-01-09 DIAGNOSIS — D352 Benign neoplasm of pituitary gland: Secondary | ICD-10-CM | POA: Diagnosis not present

## 2023-01-09 DIAGNOSIS — I5030 Unspecified diastolic (congestive) heart failure: Secondary | ICD-10-CM | POA: Diagnosis not present

## 2023-01-09 DIAGNOSIS — G8929 Other chronic pain: Secondary | ICD-10-CM | POA: Diagnosis not present

## 2023-01-09 DIAGNOSIS — J159 Unspecified bacterial pneumonia: Secondary | ICD-10-CM | POA: Diagnosis not present

## 2023-01-09 DIAGNOSIS — K219 Gastro-esophageal reflux disease without esophagitis: Secondary | ICD-10-CM | POA: Diagnosis not present

## 2023-01-09 DIAGNOSIS — I428 Other cardiomyopathies: Secondary | ICD-10-CM | POA: Diagnosis not present

## 2023-01-09 DIAGNOSIS — K209 Esophagitis, unspecified without bleeding: Secondary | ICD-10-CM | POA: Diagnosis not present

## 2023-01-09 DIAGNOSIS — I130 Hypertensive heart and chronic kidney disease with heart failure and stage 1 through stage 4 chronic kidney disease, or unspecified chronic kidney disease: Secondary | ICD-10-CM | POA: Diagnosis not present

## 2023-01-09 DIAGNOSIS — Z8543 Personal history of malignant neoplasm of ovary: Secondary | ICD-10-CM | POA: Diagnosis not present

## 2023-01-09 DIAGNOSIS — R630 Anorexia: Secondary | ICD-10-CM | POA: Diagnosis not present

## 2023-01-10 ENCOUNTER — Ambulatory Visit (INDEPENDENT_AMBULATORY_CARE_PROVIDER_SITE_OTHER): Payer: BC Managed Care – PPO | Admitting: Psychiatry

## 2023-01-10 DIAGNOSIS — Z853 Personal history of malignant neoplasm of breast: Secondary | ICD-10-CM | POA: Diagnosis not present

## 2023-01-10 DIAGNOSIS — I130 Hypertensive heart and chronic kidney disease with heart failure and stage 1 through stage 4 chronic kidney disease, or unspecified chronic kidney disease: Secondary | ICD-10-CM | POA: Diagnosis not present

## 2023-01-10 DIAGNOSIS — Z8543 Personal history of malignant neoplasm of ovary: Secondary | ICD-10-CM | POA: Diagnosis not present

## 2023-01-10 DIAGNOSIS — K209 Esophagitis, unspecified without bleeding: Secondary | ICD-10-CM | POA: Diagnosis not present

## 2023-01-10 DIAGNOSIS — Z8571 Personal history of Hodgkin lymphoma: Secondary | ICD-10-CM | POA: Diagnosis not present

## 2023-01-10 DIAGNOSIS — R630 Anorexia: Secondary | ICD-10-CM | POA: Diagnosis not present

## 2023-01-10 DIAGNOSIS — J159 Unspecified bacterial pneumonia: Secondary | ICD-10-CM | POA: Diagnosis not present

## 2023-01-10 DIAGNOSIS — I5030 Unspecified diastolic (congestive) heart failure: Secondary | ICD-10-CM | POA: Diagnosis not present

## 2023-01-10 DIAGNOSIS — E271 Primary adrenocortical insufficiency: Secondary | ICD-10-CM | POA: Diagnosis not present

## 2023-01-10 DIAGNOSIS — Z8541 Personal history of malignant neoplasm of cervix uteri: Secondary | ICD-10-CM | POA: Diagnosis not present

## 2023-01-10 DIAGNOSIS — D352 Benign neoplasm of pituitary gland: Secondary | ICD-10-CM | POA: Diagnosis not present

## 2023-01-10 DIAGNOSIS — N189 Chronic kidney disease, unspecified: Secondary | ICD-10-CM | POA: Diagnosis not present

## 2023-01-10 DIAGNOSIS — F331 Major depressive disorder, recurrent, moderate: Secondary | ICD-10-CM

## 2023-01-10 DIAGNOSIS — K219 Gastro-esophageal reflux disease without esophagitis: Secondary | ICD-10-CM | POA: Diagnosis not present

## 2023-01-10 DIAGNOSIS — E8770 Fluid overload, unspecified: Secondary | ICD-10-CM | POA: Diagnosis not present

## 2023-01-10 DIAGNOSIS — G8929 Other chronic pain: Secondary | ICD-10-CM | POA: Diagnosis not present

## 2023-01-10 DIAGNOSIS — I428 Other cardiomyopathies: Secondary | ICD-10-CM | POA: Diagnosis not present

## 2023-01-10 NOTE — Telephone Encounter (Signed)
Discussed with Dr. Barbee Shropshire. Patient being discharged from hospice as evaluation deems life expectancy is >6 months. He has contacted patient's PCP and Pulmonary to update on changes.
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
## 2023-01-11 ENCOUNTER — Other Ambulatory Visit (HOSPITAL_COMMUNITY): Payer: Self-pay

## 2023-01-14 ENCOUNTER — Other Ambulatory Visit: Payer: Medicare Other

## 2023-01-14 ENCOUNTER — Other Ambulatory Visit (HOSPITAL_COMMUNITY): Payer: Self-pay

## 2023-01-14 DIAGNOSIS — Z515 Encounter for palliative care: Secondary | ICD-10-CM

## 2023-01-14 MED ORDER — HYDROMORPHONE HCL 4 MG PO TABS
4.0000 mg | ORAL_TABLET | ORAL | 0 refills | Status: DC
  Filled 2023-01-15: qty 90, 15d supply, fill #0

## 2023-01-14 NOTE — Progress Notes (Signed)
PATIENT NAME: [name] DOB: [birth year] MRN: [id number]  PRIMARY CARE PROVIDER: Audery Amel, MD  RESPONSIBLE PARTY:  Acct ID - Guarantor Home Phone Work Phone Relationship Acct Type  [id number] [facility]* [phone]  Self P/F     [address], [place], [place] [address]   I connected with  Clement Sayres on 01/14/23 by telephone and verified that I am speaking with the correct person using two identifiers.   I discussed the limitations of evaluation and management by telemedicine. The patient expressed understanding and agreed to proceed.    Patient discharged from hospice services on 01/11/23.  Follow up call made to schedule a home visit under Palliative Care services.  Patient answered the tearful throughout call.  She has not been able to access her port and unable to self medicate with IV benadryl.  She advised her port flushes with heparin and saline but not IV benadryl.  She has communicated with her PCP regarding difficulty but PCP does not manage ports and is unable to assist.  Patient states she had the most recent port inserted in January and interventional radiology advised she would need it replaced every 6 months.  She has contacted radiology for assistance but they will need an order to proceed.  Advised patient that I would follow up with Madelynn Done, RN regarding options.  Spoke with PJ, RN and she will contact Dr. Pamelia Hoit' s office for assistance.          Truitt Merle, RN

## 2023-01-15 ENCOUNTER — Other Ambulatory Visit (HOSPITAL_COMMUNITY): Payer: Self-pay | Admitting: Endocrinology

## 2023-01-15 ENCOUNTER — Other Ambulatory Visit (HOSPITAL_COMMUNITY): Payer: Self-pay

## 2023-01-15 ENCOUNTER — Telehealth: Payer: Self-pay

## 2023-01-15 ENCOUNTER — Encounter: Payer: Self-pay | Admitting: *Deleted

## 2023-01-15 DIAGNOSIS — I878 Other specified disorders of veins: Secondary | ICD-10-CM

## 2023-01-15 NOTE — Addendum Note (Signed)
Encounter addended by: Arby Barrette on: 01/15/2023 10:00 AM  Actions taken: Imaging Exam ended

## 2023-01-15 NOTE — Telephone Encounter (Signed)
227 pm.  Follow up call made to patient.  No answer.  Message left requesting a call back.   Late Entry for 01/14/23 @ 5 pm.  Follow up call made to patient to discuss port issues.  No answer.  Message left.

## 2023-01-15 NOTE — Progress Notes (Signed)
Received in-basket from [facility] Palliative team stating pt is experiencing difficulties with port a cath and self administering IV Benadryl.  Per MD our office does not manage pt's port a cath and has not ordered saline or heparin flushes.  Pt's port a cath was ordered by Dr. Dorisann Frames for Addison's disease.  Per MD request, RN advised Palliative team that they will need to reach out to Dr. Willeen Cass office for further evaluation and treatment of port a cath.

## 2023-01-16 ENCOUNTER — Other Ambulatory Visit (HOSPITAL_COMMUNITY): Payer: Self-pay

## 2023-01-18 ENCOUNTER — Other Ambulatory Visit (HOSPITAL_COMMUNITY): Payer: Self-pay | Admitting: Endocrinology

## 2023-01-18 ENCOUNTER — Ambulatory Visit (HOSPITAL_COMMUNITY)
Admission: RE | Admit: 2023-01-18 | Discharge: 2023-01-18 | Disposition: A | Discharge status: Home / Self Care | Payer: BC Managed Care – PPO | Source: Ambulatory Visit | Attending: Endocrinology | Admitting: Endocrinology

## 2023-01-18 DIAGNOSIS — I878 Other specified disorders of veins: Secondary | ICD-10-CM

## 2023-01-18 DIAGNOSIS — Y849 Medical procedure, unspecified as the cause of abnormal reaction of the patient, or of later complication, without mention of misadventure at the time of the procedure: Secondary | ICD-10-CM | POA: Diagnosis not present

## 2023-01-18 DIAGNOSIS — T829XXA Unspecified complication of cardiac and vascular prosthetic device, implant and graft, initial encounter: Secondary | ICD-10-CM | POA: Insufficient documentation

## 2023-01-18 DIAGNOSIS — T82598A Other mechanical complication of other cardiac and vascular devices and implants, initial encounter: Secondary | ICD-10-CM | POA: Diagnosis not present

## 2023-01-18 HISTORY — PX: IR FLUORO GUIDE CV LINE RIGHT: IMG2283

## 2023-01-18 MED ORDER — HEPARIN SOD (PORK) LOCK FLUSH 100 UNIT/ML IV SOLN
INTRAVENOUS | Status: AC
  Filled 2023-01-18: qty 5

## 2023-01-18 MED ORDER — LIDOCAINE-EPINEPHRINE 1 %-1:100000 IJ SOLN
20.0000 mL | Freq: Once | INTRAMUSCULAR | Status: AC
  Administered 2023-01-18: 5 mL

## 2023-01-18 MED ORDER — HEPARIN SOD (PORK) LOCK FLUSH 100 UNIT/ML IV SOLN
500.0000 [IU] | Freq: Once | INTRAVENOUS | Status: AC
  Administered 2023-01-18: 400 [IU]

## 2023-01-18 MED ORDER — LIDOCAINE-EPINEPHRINE 1 %-1:100000 IJ SOLN
INTRAMUSCULAR | Status: AC
  Filled 2023-01-18: qty 1

## 2023-01-18 NOTE — Procedures (Signed)
Pre procedural Diagnosis: Poor venous access Post Procedural Diagnosis: Same  Successful exchange of 19 cm tip to cuff tunneled single lumen CVC with tip at the superior caval-atrial junction.    EBL: None No immediate post procedural complication.  The CVC is ready for immediate use.  Katherina Right, MD Pager #: [phone]

## 2023-01-19 ENCOUNTER — Other Ambulatory Visit: Payer: Self-pay | Admitting: Pulmonary Disease

## 2023-01-20 ENCOUNTER — Encounter (HOSPITAL_BASED_OUTPATIENT_CLINIC_OR_DEPARTMENT_OTHER): Payer: Self-pay | Admitting: Pulmonary Disease

## 2023-01-22 ENCOUNTER — Encounter (HOSPITAL_COMMUNITY): Payer: Self-pay | Admitting: *Deleted

## 2023-01-22 ENCOUNTER — Other Ambulatory Visit: Payer: Self-pay

## 2023-01-22 ENCOUNTER — Emergency Department (HOSPITAL_COMMUNITY): Payer: BC Managed Care – PPO

## 2023-01-22 ENCOUNTER — Observation Stay (HOSPITAL_COMMUNITY)
Admission: EM | Admit: 2023-01-22 | Discharge: 2023-01-24 | Disposition: A | Discharge status: Home / Self Care | Payer: BC Managed Care – PPO | Attending: Internal Medicine | Admitting: Internal Medicine

## 2023-01-22 DIAGNOSIS — A419 Sepsis, unspecified organism: Principal | ICD-10-CM

## 2023-01-22 DIAGNOSIS — F32A Depression, unspecified: Secondary | ICD-10-CM | POA: Diagnosis present

## 2023-01-22 DIAGNOSIS — Z7984 Long term (current) use of oral hypoglycemic drugs: Secondary | ICD-10-CM | POA: Insufficient documentation

## 2023-01-22 DIAGNOSIS — Z7189 Other specified counseling: Secondary | ICD-10-CM

## 2023-01-22 DIAGNOSIS — I50810 Right heart failure, unspecified: Secondary | ICD-10-CM | POA: Insufficient documentation

## 2023-01-22 DIAGNOSIS — I130 Hypertensive heart and chronic kidney disease with heart failure and stage 1 through stage 4 chronic kidney disease, or unspecified chronic kidney disease: Secondary | ICD-10-CM | POA: Insufficient documentation

## 2023-01-22 DIAGNOSIS — Z853 Personal history of malignant neoplasm of breast: Secondary | ICD-10-CM | POA: Insufficient documentation

## 2023-01-22 DIAGNOSIS — Z8543 Personal history of malignant neoplasm of ovary: Secondary | ICD-10-CM | POA: Insufficient documentation

## 2023-01-22 DIAGNOSIS — Z7982 Long term (current) use of aspirin: Secondary | ICD-10-CM | POA: Insufficient documentation

## 2023-01-22 DIAGNOSIS — Z86018 Personal history of other benign neoplasm: Secondary | ICD-10-CM | POA: Diagnosis not present

## 2023-01-22 DIAGNOSIS — Z89022 Acquired absence of left finger(s): Secondary | ICD-10-CM | POA: Diagnosis not present

## 2023-01-22 DIAGNOSIS — Z1152 Encounter for screening for COVID-19: Secondary | ICD-10-CM | POA: Insufficient documentation

## 2023-01-22 DIAGNOSIS — R110 Nausea: Secondary | ICD-10-CM | POA: Diagnosis not present

## 2023-01-22 DIAGNOSIS — R509 Fever, unspecified: Secondary | ICD-10-CM

## 2023-01-22 DIAGNOSIS — N179 Acute kidney failure, unspecified: Secondary | ICD-10-CM

## 2023-01-22 DIAGNOSIS — E039 Hypothyroidism, unspecified: Secondary | ICD-10-CM | POA: Diagnosis not present

## 2023-01-22 DIAGNOSIS — J45909 Unspecified asthma, uncomplicated: Secondary | ICD-10-CM | POA: Diagnosis not present

## 2023-01-22 DIAGNOSIS — F331 Major depressive disorder, recurrent, moderate: Secondary | ICD-10-CM | POA: Diagnosis present

## 2023-01-22 DIAGNOSIS — Z8541 Personal history of malignant neoplasm of cervix uteri: Secondary | ICD-10-CM | POA: Diagnosis not present

## 2023-01-22 DIAGNOSIS — N1831 Chronic kidney disease, stage 3a: Secondary | ICD-10-CM | POA: Diagnosis present

## 2023-01-22 DIAGNOSIS — Z85850 Personal history of malignant neoplasm of thyroid: Secondary | ICD-10-CM

## 2023-01-22 DIAGNOSIS — D509 Iron deficiency anemia, unspecified: Secondary | ICD-10-CM | POA: Insufficient documentation

## 2023-01-22 DIAGNOSIS — Z79899 Other long term (current) drug therapy: Secondary | ICD-10-CM | POA: Insufficient documentation

## 2023-01-22 DIAGNOSIS — J9611 Chronic respiratory failure with hypoxia: Secondary | ICD-10-CM | POA: Diagnosis present

## 2023-01-22 DIAGNOSIS — Z8571 Personal history of Hodgkin lymphoma: Secondary | ICD-10-CM | POA: Insufficient documentation

## 2023-01-22 DIAGNOSIS — D649 Anemia, unspecified: Secondary | ICD-10-CM | POA: Insufficient documentation

## 2023-01-22 DIAGNOSIS — I10 Essential (primary) hypertension: Secondary | ICD-10-CM | POA: Diagnosis present

## 2023-01-22 DIAGNOSIS — F419 Anxiety disorder, unspecified: Secondary | ICD-10-CM | POA: Diagnosis present

## 2023-01-22 DIAGNOSIS — J189 Pneumonia, unspecified organism: Secondary | ICD-10-CM | POA: Diagnosis present

## 2023-01-22 DIAGNOSIS — E271 Primary adrenocortical insufficiency: Secondary | ICD-10-CM | POA: Diagnosis not present

## 2023-01-22 LAB — CBC WITH DIFFERENTIAL/PLATELET
Abs Immature Granulocytes: 0.07 10*3/uL (ref 0.00–0.07)
Basophils Absolute: 0 10*3/uL (ref 0.0–0.1)
Basophils Relative: 0 %
Eosinophils Absolute: 0 10*3/uL (ref 0.0–0.5)
Eosinophils Relative: 0 %
HCT: 31.7 % — ABNORMAL LOW (ref 36.0–46.0)
Hemoglobin: 9.7 g/dL — ABNORMAL LOW (ref 12.0–15.0)
Immature Granulocytes: 1 %
Lymphocytes Relative: 10 %
Lymphs Abs: 0.9 10*3/uL (ref 0.7–4.0)
MCH: 23.8 pg — ABNORMAL LOW (ref 26.0–34.0)
MCHC: 30.6 g/dL (ref 30.0–36.0)
MCV: 77.7 fL — ABNORMAL LOW (ref 80.0–100.0)
Monocytes Absolute: 0.5 10*3/uL (ref 0.1–1.0)
Monocytes Relative: 6 %
Neutro Abs: 7.7 10*3/uL (ref 1.7–7.7)
Neutrophils Relative %: 83 %
Platelets: 261 10*3/uL (ref 150–400)
RBC: 4.08 MIL/uL (ref 3.87–5.11)
RDW: 18.6 % — ABNORMAL HIGH (ref 11.5–15.5)
WBC: 9.2 10*3/uL (ref 4.0–10.5)
nRBC: 0 % (ref 0.0–0.2)

## 2023-01-22 LAB — SARS CORONAVIRUS 2 BY RT PCR: SARS Coronavirus 2 by RT PCR: NEGATIVE

## 2023-01-22 LAB — URINALYSIS, ROUTINE W REFLEX MICROSCOPIC
Bilirubin Urine: NEGATIVE
Glucose, UA: NEGATIVE mg/dL
Hgb urine dipstick: NEGATIVE
Ketones, ur: NEGATIVE mg/dL
Nitrite: NEGATIVE
Protein, ur: NEGATIVE mg/dL
Specific Gravity, Urine: 1.005 (ref 1.005–1.030)
pH: 8 (ref 5.0–8.0)

## 2023-01-22 LAB — COMPREHENSIVE METABOLIC PANEL
ALT: 46 U/L — ABNORMAL HIGH (ref 0–44)
AST: 27 U/L (ref 15–41)
Albumin: 3.5 g/dL (ref 3.5–5.0)
Alkaline Phosphatase: 93 U/L (ref 38–126)
Anion gap: 14 (ref 5–15)
BUN: 32 mg/dL — ABNORMAL HIGH (ref 6–20)
CO2: 23 mmol/L (ref 22–32)
Calcium: 8.5 mg/dL — ABNORMAL LOW (ref 8.9–10.3)
Chloride: 99 mmol/L (ref 98–111)
Creatinine, Ser: 1.8 mg/dL — ABNORMAL HIGH (ref 0.44–1.00)
GFR, Estimated: 34 mL/min — ABNORMAL LOW (ref >60)
Glucose, Bld: 165 mg/dL — ABNORMAL HIGH (ref 70–99)
Potassium: 3.5 mmol/L (ref 3.5–5.1)
Sodium: 136 mmol/L (ref 135–145)
Total Bilirubin: 0.6 mg/dL (ref 0.3–1.2)
Total Protein: 8.1 g/dL (ref 6.5–8.1)

## 2023-01-22 LAB — PROTIME-INR
INR: 1 (ref 0.8–1.2)
Prothrombin Time: 13.3 seconds (ref 11.4–15.2)

## 2023-01-22 LAB — LACTIC ACID, PLASMA: Lactic Acid, Venous: 1 mmol/L (ref 0.5–1.9)

## 2023-01-22 MED ORDER — BUMETANIDE 1 MG PO TABS
1.0000 mg | ORAL_TABLET | Freq: Two times a day (BID) | ORAL | Status: DC
  Administered 2023-01-23 – 2023-01-24 (×3): 1 mg via ORAL
  Filled 2023-01-22 (×4): qty 1

## 2023-01-22 MED ORDER — SODIUM CHLORIDE 0.9% FLUSH
3.0000 mL | Freq: Two times a day (BID) | INTRAVENOUS | Status: DC
  Administered 2023-01-23 (×2): 3 mL via INTRAVENOUS

## 2023-01-22 MED ORDER — LACTATED RINGERS IV SOLN: INTRAVENOUS | Status: AC

## 2023-01-22 MED ORDER — VANCOMYCIN HCL IN DEXTROSE 1-5 GM/200ML-% IV SOLN: 1000.0000 mg | INTRAVENOUS | Status: DC

## 2023-01-22 MED ORDER — ESCITALOPRAM OXALATE 20 MG PO TABS
20.0000 mg | ORAL_TABLET | Freq: Every day | ORAL | Status: DC
  Administered 2023-01-24: 20 mg via ORAL
  Filled 2023-01-22 (×2): qty 1

## 2023-01-22 MED ORDER — BUPROPION HCL ER (XL) 150 MG PO TB24
150.0000 mg | ORAL_TABLET | Freq: Every day | ORAL | Status: DC
  Administered 2023-01-23 – 2023-01-24 (×2): 150 mg via ORAL
  Filled 2023-01-22 (×2): qty 1

## 2023-01-22 MED ORDER — VANCOMYCIN HCL 1500 MG/300ML IV SOLN
1500.0000 mg | Freq: Once | INTRAVENOUS | Status: AC
  Administered 2023-01-22: 1500 mg via INTRAVENOUS
  Filled 2023-01-22: qty 300

## 2023-01-22 MED ORDER — LEVOTHYROXINE SODIUM 100 MCG PO TABS
100.0000 ug | ORAL_TABLET | Freq: Every morning | ORAL | Status: DC
  Administered 2023-01-24: 100 ug via ORAL
  Filled 2023-01-22 (×2): qty 1

## 2023-01-22 MED ORDER — CLONAZEPAM 0.5 MG PO TABS
0.5000 mg | ORAL_TABLET | Freq: Three times a day (TID) | ORAL | Status: DC | PRN
  Administered 2023-01-23 – 2023-01-24 (×3): 0.5 mg via ORAL
  Filled 2023-01-22 (×3): qty 1

## 2023-01-22 MED ORDER — LACTATED RINGERS IV BOLUS (SEPSIS)
1000.0000 mL | Freq: Once | INTRAVENOUS | Status: AC
  Administered 2023-01-22: 1000 mL via INTRAVENOUS

## 2023-01-22 MED ORDER — ACETAMINOPHEN 650 MG RE SUPP: 650.0000 mg | Freq: Four times a day (QID) | RECTAL | Status: DC | PRN

## 2023-01-22 MED ORDER — METRONIDAZOLE 500 MG/100ML IV SOLN
500.0000 mg | Freq: Once | INTRAVENOUS | Status: AC
  Administered 2023-01-22: 500 mg via INTRAVENOUS
  Filled 2023-01-22: qty 100

## 2023-01-22 MED ORDER — TIOTROPIUM BROMIDE MONOHYDRATE 1.25 MCG/ACT IN AERS: 2.0000 | INHALATION_SPRAY | Freq: Every day | RESPIRATORY_TRACT | Status: DC

## 2023-01-22 MED ORDER — SODIUM CHLORIDE 0.9 % IV SOLN
2.0000 g | Freq: Two times a day (BID) | INTRAVENOUS | Status: DC
  Administered 2023-01-23: 2 g via INTRAVENOUS
  Filled 2023-01-22: qty 12.5

## 2023-01-22 MED ORDER — LAMOTRIGINE 100 MG PO TABS
200.0000 mg | ORAL_TABLET | Freq: Every day | ORAL | Status: DC
  Administered 2023-01-23 (×2): 200 mg via ORAL
  Filled 2023-01-22 (×2): qty 2

## 2023-01-22 MED ORDER — DIPHENHYDRAMINE HCL 50 MG/ML IJ SOLN: 25.0000 mg | Freq: Four times a day (QID) | INTRAMUSCULAR | Status: DC | PRN

## 2023-01-22 MED ORDER — MOMETASONE FURO-FORMOTEROL FUM 200-5 MCG/ACT IN AERO
2.0000 | INHALATION_SPRAY | Freq: Two times a day (BID) | RESPIRATORY_TRACT | Status: DC
  Administered 2023-01-23 (×2): 2 via RESPIRATORY_TRACT
  Filled 2023-01-22: qty 8.8

## 2023-01-22 MED ORDER — HYDROCORTISONE 10 MG PO TABS
20.0000 mg | ORAL_TABLET | Freq: Every day | ORAL | Status: DC
  Administered 2023-01-23 – 2023-01-24 (×2): 20 mg via ORAL
  Filled 2023-01-22 (×3): qty 2

## 2023-01-22 MED ORDER — TOPIRAMATE 25 MG PO TABS
150.0000 mg | ORAL_TABLET | Freq: Every day | ORAL | Status: DC
  Administered 2023-01-23 (×2): 150 mg via ORAL
  Filled 2023-01-22 (×2): qty 2

## 2023-01-22 MED ORDER — HYDROCORTISONE 10 MG PO TABS
10.0000 mg | ORAL_TABLET | Freq: Every day | ORAL | Status: DC
  Administered 2023-01-23: 10 mg via ORAL
  Filled 2023-01-22: qty 1

## 2023-01-22 MED ORDER — HYDROXYZINE HCL 25 MG PO TABS: 25.0000 mg | ORAL_TABLET | Freq: Four times a day (QID) | ORAL | Status: DC | PRN

## 2023-01-22 MED ORDER — ENOXAPARIN SODIUM 40 MG/0.4ML IJ SOSY
40.0000 mg | PREFILLED_SYRINGE | INTRAMUSCULAR | Status: DC
  Administered 2023-01-23 – 2023-01-24 (×2): 40 mg via SUBCUTANEOUS
  Filled 2023-01-22 (×2): qty 0.4

## 2023-01-22 MED ORDER — UMECLIDINIUM BROMIDE 62.5 MCG/ACT IN AEPB
1.0000 | INHALATION_SPRAY | Freq: Every day | RESPIRATORY_TRACT | Status: DC
  Administered 2023-01-23: 1 via RESPIRATORY_TRACT
  Filled 2023-01-22: qty 7

## 2023-01-22 MED ORDER — VANCOMYCIN HCL IN DEXTROSE 1-5 GM/200ML-% IV SOLN: 1000.0000 mg | Freq: Once | INTRAVENOUS | Status: DC

## 2023-01-22 MED ORDER — ZOLPIDEM TARTRATE 5 MG PO TABS
5.0000 mg | ORAL_TABLET | Freq: Every evening | ORAL | Status: DC | PRN
  Administered 2023-01-23 (×2): 5 mg via ORAL
  Filled 2023-01-22 (×2): qty 1

## 2023-01-22 MED ORDER — ACETAMINOPHEN 325 MG PO TABS
650.0000 mg | ORAL_TABLET | ORAL | Status: DC | PRN
  Administered 2023-01-23: 650 mg via ORAL
  Filled 2023-01-22: qty 2

## 2023-01-22 MED ORDER — SODIUM CHLORIDE 0.9 % IV SOLN
2.0000 g | Freq: Once | INTRAVENOUS | Status: AC
  Administered 2023-01-22: 2 g via INTRAVENOUS
  Filled 2023-01-22: qty 12.5

## 2023-01-22 NOTE — H&P (Signed)
History and Physical    [name]  ZOX:[id number]  DOB: 1970-10-30  DOA: 01/22/2023  PCP: Susan Amel, MD Patient coming from: Home  Chief Complaint: fever at home  HPI:  [name] is a 52 yo female with extensive PMH including chronic lymphedema, hx cushings secondary to pituitary adenoma s/p resection and gamma knife complicated by adrenal insufficiency, history of Hodgkin's at age 58 s/p chemoradiation, breast cancer s/p chemotherapy and bilateral mastectomy in 2000 which is in remission, cervical cancer s/p LEEP 2002, ovarian tumor "precancerous" s/p TH and left oophorectomy in 2011 and right in 2016, hx postablative hypothyroidism, ADHD, anxiety/depression and chronic hypoxemic respiratory failure secondary to unknown etiology.   She has been followed closely by pulmonology and recently had been referred to hospice.  She was on hospice due to heart failure and respiratory failure.  She resided for approximately 5 days at the hospice home in [place] and due to stability, was discharged home and downgraded to palliative care. Upon returning home, she appears to have had some worsening and has developed high fevers with associated "shaking" not fully consistent with rigors per patient.  Therefore, she presented to the ER for further evaluation.  There was no overt fever noted in the ER but patient reported temperature up to 103 degrees at home.  She had mild tachycardia, tachypnea.  She had no urinary symptoms and urinalysis was notable only for moderate LE but only 6-10 WBC.  Normal lactic acid and no leukocytosis.  She recently had her right port replaced on 01/18/2023 with radiology.  It was not flushing/returning blood and therefore was replaced.  She has been told to have port replacements every 6 months. There has been no redness or pain associated around her port site either.  Blood cultures were obtained in the ER and she was started on vancomycin, cefepime, Flagyl and  admitted for further sepsis workup.  I have personally briefly reviewed patient's old medical records in [facility] and discussed patient with the ER provider when appropriate/indicated.  Assessment and Plan: * Sepsis - Tachycardia, tachypnea.  Currently no obvious source.  Urinalysis not impressive and patient asymptomatic.  Port site also looks benign but would be other considered potential source.  Other considered differential includes not true fever at home esp with atypical "rigor" and no fever on presentation and sepsis not impressive at this time - given her hx, reasonable to treat empirically for now and watch cultures and possibly just complete short empiric course; seems that continuing to involve palliative care at discharge would be prudent - continue vanc/cefepime for now. Monitor blood cultures and/or for any other evidence of infection  Acute renal failure superimposed on stage 3a chronic kidney disease - patient has history of CKD3a. Baseline creat ~ 1.3 - 1.4, eGFR~ 50 - patient presents with increase in creat >0.3 mg/dL above baseline, creat increase >1.5x baseline presumed to have occurred within past 7 days PTA - possibly prerenal from poor intake - creat 1.8 on admission - continue IVF - BMP in am - if no improvement can consider renal u/s and further workup   Chronic nausea - Patient states purpose of port placement was for IV Benadryl as she is unable to take any other antiemetics - Continue IV Benadryl  Goals of care, counseling/discussion - Recently on hospice and was at hospice home of [place].  Discharged home due to stability and referred to palliative care - Will need to continue palliative care at home again  upon discharge  Addison's disease - Continue hydrocortisone -Blood pressure normal, no indication for stress dose steroids at this time  Chronic respiratory failure with hypoxia - on 4-5 L at home; remains at baseline  Microcytic  anemia - Baseline hemoglobin around 9 to 10 g/dL - Currently at baseline  Anxiety and depression - Continue clonazepam  Major depressive disorder, recurrent episode, moderate - Continue Lexapro, Wellbutrin, Lamictal, Ambien  Hypertension - Blood pressure appropriate - In setting of potential sepsis, hold blood pressure medications  History of thyroid cancer - continue synthroid   Code Status:     Code Status: DNR  DVT Prophylaxis: Lovenox     Anticipated disposition is to: Home  History: Past Medical History:  Diagnosis Date   Addison's disease    Adrenal insufficiency    Anemia    Anxiety    Aortic stenosis    Aortic stenosis    Appendicitis 12/19/09   Appendicitis    Breast cancer    STATUS POST BILATERAL MASTECTOMY. STATUS POST RECONSTRUCTION. SHE HAD SILICONE BREAST IMPLANTS AND THE LEFT IMPLANT IS LEAKING SLIGHTLY   Cellulitis of right middle finger 11/07/2018   Cervical cancer 12/23/2018   Chest pain    CHF with right heart failure 04/17/2017   Chronic respiratory failure with hypoxia 12/23/2018   Cough variant asthma 04/13/2019   Depression    GERD (gastroesophageal reflux disease)    takes Dexilant and carafate and gi coctail    Headache    migraines on a daily and monthly regimen    Heart murmur    History of kidney stones    Hodgkin lymphoma    STATUS POST MANTLE RADIATION   Hodgkin's lymphoma    1987   Hypertension    Hypoxia    Necrotizing fasciitis 12/23/2018   Non-ischemic cardiomyopathy    Osteoporosis    Palpitations    Pituitary adenoma 12/23/2018   Pneumonia    PONV (postoperative nausea and vomiting)    Pre-diabetes    per pt; no meds   Pulmonary hypertension 12/23/2018   Raynaud phenomenon    Right heart failure 04/17/2017   Seizures    last febrile seizure was approx 3 weeks ago per report on 12/01/2020   Sleep apnea    upcoming sleep study per pt    Supplemental oxygen dependent    3 liters   SVT (supraventricular tachycardia)     Tachycardia    Thyroid cancer    STATUS POST SURGICAL REMOVAL-CURRENT ON THYROID REPLACEMENT    Past Surgical History:  Procedure Laterality Date   ABDOMINAL HYSTERECTOMY     AMPUTATION Left 01/30/2019   Procedure: Left Index finger amputation with flap reconstruction and repair reconstruction;  Surgeon: Dominica Severin, MD;  Location: [facility] OR;  Service: Orthopedics;  Laterality: Left;   APPENDECTOMY     breast implants and removal      breast implants but leaking      CARDIAC CATHETERIZATION  05/18/09   NORMAL CATH   COLONOSCOPY     hx of chemotherapy      hx of radiation therapy      I & D EXTREMITY Left 12/23/2018   Procedure: IRRIGATION AND DEBRIDEMENT HAND / INDEX FINGER;  Surgeon: Dominica Severin, MD;  Location: [facility] OR;  Service: Orthopedics;  Laterality: Left;   IR CV LINE INJECTION  03/22/2022   IR FLUORO GUIDE CV LINE RIGHT  10/09/2022   IR FLUORO GUIDE CV LINE RIGHT  01/18/2023   IR IMAGING GUIDED  PORT INSERTION  05/06/2020   IR IMAGING GUIDED PORT INSERTION  12/04/2021   IR REMOVAL TUN ACCESS W/ PORT W/O FL MOD SED  04/27/2020   IR REMOVAL TUN ACCESS W/ PORT W/O FL MOD SED  12/04/2021   IR REMOVAL TUN ACCESS W/ PORT W/O FL MOD SED  03/30/2022   IR US GUIDE VASC ACCESS RIGHT  10/09/2022   KIDNEY STONE SURGERY     LUMBAR PUNCTURE W/ INTRATHECAL CHEMOTHERAPY     MASTECTOMY     PITUITARY SURGERY     RIGHT/LEFT HEART CATH AND CORONARY ANGIOGRAPHY N/A 04/02/2018   Procedure: RIGHT/LEFT HEART CATH AND CORONARY ANGIOGRAPHY;  Surgeon: Kathleene Hazel, MD;  Location: [facility] INVASIVE CV LAB;  Service: Cardiovascular;  Laterality: N/A;   RIGHT/LEFT HEART CATH AND CORONARY ANGIOGRAPHY N/A 08/31/2022   Procedure: RIGHT/LEFT HEART CATH AND CORONARY ANGIOGRAPHY;  Surgeon: Dolores Patty, MD;  Location: [facility] INVASIVE CV LAB;  Service: Cardiovascular;  Laterality: N/A;   TOOTH EXTRACTION N/A 12/05/2020   Procedure: DENTAL RESTORATION/EXTRACTIONS;  Surgeon: Lovena Neighbours, MD;  Location: [facility] ORS;   Service: Oral Surgery;  Laterality: N/A;   TOTAL THYROIDECTOMY     VIDEO BRONCHOSCOPY Bilateral 11/14/2018   Procedure: VIDEO BRONCHOSCOPY WITHOUT FLUORO;  Surgeon: Luciano Cutter, MD;  Location: [facility] ENDOSCOPY;  Service: Cardiopulmonary;  Laterality: Bilateral;   VIDEO BRONCHOSCOPY WITH ENDOBRONCHIAL ULTRASOUND N/A 11/19/2018   Procedure: VIDEO BRONCHOSCOPY WITH ENDOBRONCHIAL ULTRASOUND;  Surgeon: Luciano Cutter, MD;  Location: [facility] OR;  Service: Thoracic;  Laterality: N/A;     reports that she has never smoked. She has never used smokeless tobacco. She reports that she does not currently use alcohol. She reports that she does not use drugs.  Allergies  Allergen Reactions   Ferrous Bisglycinate Chelate [Iron] Anaphylaxis and Other (See Comments)    Only IV - only FERRLICET   Mushroom Extract Complex Anaphylaxis   Na Ferric Gluc Cplx In Sucrose Anaphylaxis   Cymbalta [Duloxetine Hcl] Swelling and Anxiety   Hydromorphone Other (See Comments)    BP drop and heart rate drops 5.1.20 PT REPORTS THAT SHE TAKES DILAUDID AT HOME   Ondansetron Hcl Other (See Comments)   Promethazine Other (See Comments)    Other reaction(s): Unknown   Succinylcholine Other (See Comments)    Lock Jaw   Buprenorphine Hcl Hives   Compazine Other (See Comments)    Altered mental status Aggression   Duloxetine     Other reaction(s): Not available   Metoclopramide Other (See Comments)    Dystonia   Morphine And Related Hives   Ondansetron Hives and Rash    Other reaction(s): Unknown Other reaction(s): Unknown   Promethazine Hcl Hives   Tegaderm Ag Mesh [Silver] Rash    Old formulation only, is able to tolerate new formulation    Family History  Family history unknown: Yes    Home Medications: Prior to Admission medications   Medication Sig Start Date End Date Taking? Authorizing Provider  albuterol (PROVENTIL) (2.5 MG/3ML) 0.083% nebulizer solution Take 3 mLs (2.5 mg total) by nebulization every 6  (six) hours as needed for wheezing or shortness of breath. 07/09/22 01/22/23 Yes Pokhrel, Laxman, MD  aspirin EC 81 MG tablet Take 1 tablet (81 mg total) by mouth daily. Swallow whole. Patient taking differently: Take 81 mg by mouth at bedtime. Chewable 07/16/22  Yes Nahser, Deloris Ping, MD  Bismuth Tribromoph-Petrolatum (XEROFORM OCCLUSIVE GAUZE STRIP) PADS Apply 1 each topically as directed. 09/17/22  Yes Lynden Oxford  M, MD  bisoprolol (ZEBETA) 5 MG tablet Take 0.5 tablets (2.5 mg total) by mouth daily. Take 0.5 tablet by mouth daily Patient taking differently: Take 2.5 mg by mouth 2 (two) times daily. Take 0.5 tablet by mouth daily 09/19/22  Yes Rolly Salter, MD  bumetanide (BUMEX) 1 MG tablet TAKE 1 TABLET BY MOUTH TWICE A DAY Patient taking differently: Take 1 mg by mouth 2 (two) times daily. 11/20/22  Yes Nahser, Deloris Ping, MD  buPROPion (WELLBUTRIN XL) 150 MG 24 hr tablet Take 1 tablet (150 mg total) by mouth daily. 12/24/22  Yes Mozingo, Thereasa Solo, NP  clonazePAM (KLONOPIN) 0.5 MG tablet TAKE 1 TABLET (0.5 MG TOTAL) BY MOUTH 3 (THREE) TIMES DAILY AS NEEDED FOR ANXIETY. 11/08/22  Yes Mozingo, Thereasa Solo, NP  dexlansoprazole (DEXILANT) 60 MG capsule Take 60 mg by mouth daily.   Yes [provider]  Diclofenac Potassium,Migraine, (CAMBIA) 50 MG PACK Take 50 mg by mouth daily as needed for migraine. 05/16/21  Yes [provider]  diphenhydrAMINE (BENADRYL) 12.5 MG/5ML elixir Take 10 mLs (25 mg total) by mouth every 6 (six) hours as needed (nausea). 09/17/22  Yes Rolly Salter, MD  dronabinol (MARINOL) 2.5 MG capsule TAKE 1 CAPSULE BY MOUTH TWICE A DAY AS NEEDED NAUSEA Patient taking differently: Take 2.5 mg by mouth at bedtime. 09/10/22  Yes Serena Croissant, MD  EPINEPHRINE 0.3 mg/0.3 mL IJ SOAJ injection INJECT 0.3 MG INTO THE MUSCLE AS NEEDED FOR ANAPHYLAXIS. 01/21/23  Yes Luciano Cutter, MD  escitalopram (LEXAPRO) 20 MG tablet TAKE 1 TABLET BY MOUTH EVERY DAY IN THE  EVENING 11/01/22  Yes Mozingo, Thereasa Solo, NP  FLORASTOR 250 MG capsule Take 250 mg by mouth See admin instructions. Take 250 mg by mouth mid-morning and mid-afternoon (1600)   Yes [provider]  fluticasone-salmeterol (ADVAIR HFA) 230-21 MCG/ACT inhaler INHALE 2 PUFFS INTO THE LUNGS TWICE A DAY Patient taking differently: Inhale 2 puffs into the lungs 2 (two) times daily. 08/15/22  Yes Luciano Cutter, MD  guaiFENesin (ROBITUSSIN) 100 MG/5ML liquid Take 15 mLs by mouth every 6 (six) hours as needed for cough or to loosen phlegm. 07/09/22  Yes Pokhrel, Laxman, MD  hydrocortisone (CORTEF) 10 MG tablet Take 1-2 tablets (10-20 mg total) by mouth See admin instructions. Take 20 mg in the am and 10mg  in the evening. Take after completing prednisone 09/29/22  Yes Rolly Salter, MD  HYDROmorphone (DILAUDID) 4 MG tablet Take 1 tablet (4 mg total) by mouth every 4 (four) hours as needed. 12/28/22  Yes   lamoTRIgine (LAMICTAL) 200 MG tablet TAKE 1 TABLET BY MOUTH EVERYDAY AT BEDTIME Patient taking differently: Take 200 mg by mouth at bedtime. 12/24/22  Yes Mozingo, Thereasa Solo, NP  menthol-cetylpyridinium (CEPACOL) 3 MG lozenge Take 1 lozenge (3 mg total) by mouth as needed for sore throat. 09/17/22  Yes Rolly Salter, MD  nitroGLYCERIN (NITROSTAT) 0.4 MG SL tablet Dissolve 1 tablet under the tongue every 5 minutes as needed for chest pain. Max of 3 doses, then 911. 08/20/22  Yes Nahser, Deloris Ping, MD  PROAIR HFA 108 [phone] Base) MCG/ACT inhaler Inhale 2 puffs into the lungs every 4 (four) hours as needed for wheezing or shortness of breath.   Yes [provider]  QVAR REDIHALER 80 MCG/ACT inhaler INHALE 1 PUFF INTO THE LUNGS TWICE A DAY Patient taking differently: Inhale 1 puff into the lungs 2 (two) times daily. 01/21/23  Yes Luciano Cutter, MD  Rimegepant Sulfate (NURTEC) 75 MG TBDP Take 75 mg by mouth daily as needed (Migraine).    Yes [provider]  rosuvastatin  (CRESTOR) 10 MG tablet Take 10 mg by mouth daily. Mid morning 02/28/18  Yes [provider]  sacubitril-valsartan (ENTRESTO) 24-26 MG Take 1 tablet by mouth 2 (two) times daily. 09/22/22  Yes Rolly Salter, MD  silver sulfADIAZINE (SILVADENE) 1 % cream Apply to affected area daily 09/17/22 09/17/23 Yes Rolly Salter, MD  sitaGLIPtin (JANUVIA) 100 MG tablet Take 1 tablet (100 mg total) by mouth daily. 10/19/21  Yes Serena Croissant, MD  sucralfate (CARAFATE) 1 GM/10ML suspension Take 1 g by mouth daily as needed (as directed for ulcers).   Yes [provider]  SYNTHROID 100 MCG tablet Take 1 tablet (100 mcg total) by mouth every morning. Patient taking differently: Take 100 mcg by mouth every morning. * Must be name brand* 10/19/21  Yes Serena Croissant, MD  Tiotropium Bromide Monohydrate (SPIRIVA RESPIMAT) 1.25 MCG/ACT AERS Inhale 2 puffs into the lungs daily. 08/15/22  Yes Luciano Cutter, MD  topiramate (TOPAMAX) 50 MG tablet Take 150 mg by mouth at bedtime.  12/25/19  Yes [provider]  zolpidem (AMBIEN CR) 12.5 MG CR tablet Take 12.5 mg by mouth at bedtime. 03/05/22  Yes [provider]  EMGALITY 120 MG/ML SOSY Inject 120 mg into the skin every 28 (twenty-eight) days.    [provider]  HYDROmorphone (DILAUDID) 4 MG tablet Take 1 tablet (4 mg total) by mouth every 4 (four) hours, as needed 01/11/23     Lancets (ONETOUCH DELICA PLUS LANCET33G) MISC 3 (three) times daily. for testing 06/12/21   [provider]  LORazepam (ATIVAN) 0.5 MG tablet Take 1 tablet (0.5 mg total) by mouth every 6 (six) hours as needed. 12/28/22     ONETOUCH VERIO test strip 3 (three) times daily. for testing 06/12/21   [provider]  oxyCODONE-acetaminophen (PERCOCET/ROXICET) 5-325 MG tablet Take 1 tablet by mouth every 8 (eight) hours as needed for moderate pain or severe pain. Patient not taking: Reported on 01/22/2023 09/17/22   Rolly Salter, MD  OXYGEN Inhale  4-5 L/min into the lungs continuous.    [provider]  potassium chloride SA (KLOR-CON M) 20 MEQ tablet Take 1 tablet (20 mEq total) by mouth daily for 3 days. Patient not taking: Reported on 01/22/2023 09/17/22 09/20/22  Rolly Salter, MD  predniSONE (DELTASONE) 10 MG tablet Take 40mg  daily for 3days,Take 30mg  daily for 3days,Take 20mg  daily for 3days,Take 10mg  daily for 3days, then stop and switch to cortef Patient not taking: Reported on 01/22/2023 09/17/22   Rolly Salter, MD  Sodium Chloride Flush (NORMAL SALINE FLUSH) 0.9 % SOLN Use as directed for central venous catheter maintenance 10/10/22       Review of Systems:  Review of Systems  Constitutional:  Positive for chills, fever and malaise/fatigue.  HENT: Negative.    Eyes: Negative.   Respiratory: Negative.    Cardiovascular: Negative.   Gastrointestinal: Negative.   Genitourinary: Negative.   Musculoskeletal: Negative.   Skin: Negative.   Neurological: Negative.   Endo/Heme/Allergies: Negative.   Psychiatric/Behavioral: Negative.      Physical Exam:  Vitals:   01/22/23 2045 01/22/23 2100 01/22/23 2115 01/22/23 2239  BP: 111/71 124/79 122/69   Pulse: 97 (!) 102 100   Resp: 19 14 (!) 22   Temp:    97.9 F (36.6 C)  TempSrc:  Oral  SpO2: 100% 100% 100%   Weight:       Physical Exam Constitutional:      Comments: Tearful and anxious appearing woman lying in bed speaking in 1-2 word sentences due to tearfulness  HENT:     Head: Normocephalic and atraumatic.     Mouth/Throat:     Mouth: Mucous membranes are moist.  Eyes:     Extraocular Movements: Extraocular movements intact.  Cardiovascular:     Rate and Rhythm: Normal rate and regular rhythm.  Pulmonary:     Effort: Pulmonary effort is normal. No respiratory distress.     Breath sounds: Normal breath sounds. No wheezing.  Chest:     Comments: Port benign (no erythema, tenderness, wounds) Abdominal:     General: Bowel sounds are normal. There  is no distension.     Palpations: Abdomen is soft.     Tenderness: There is no abdominal tenderness.  Musculoskeletal:        General: Swelling (chronic generalized edema consistent with cushingoid) present. Normal range of motion.     Cervical back: Normal range of motion and neck supple.  Skin:    General: Skin is warm and dry.  Neurological:     General: No focal deficit present.  Psychiatric:     Comments: Depressed mood      Labs on Admission:  I have personally reviewed following labs and imaging studies Results for orders placed or performed during the hospital encounter of 01/22/23 (from the past 24 hour(s))  Urinalysis, Routine w reflex microscopic -Urine, Clean Catch     Status: Abnormal   Collection Time: 01/22/23  5:39 PM  Result Value Ref Range   Color, Urine YELLOW YELLOW   APPearance CLEAR CLEAR   Specific Gravity, Urine 1.005 1.005 - 1.030   pH 8.0 5.0 - 8.0   Glucose, UA NEGATIVE NEGATIVE mg/dL   Hgb urine dipstick NEGATIVE NEGATIVE   Bilirubin Urine NEGATIVE NEGATIVE   Ketones, ur NEGATIVE NEGATIVE mg/dL   Protein, ur NEGATIVE NEGATIVE mg/dL   Nitrite NEGATIVE NEGATIVE   Leukocytes,Ua MODERATE (A) NEGATIVE   RBC / HPF 0-5 0 - 5 RBC/hpf   WBC, UA 6-10 0 - 5 WBC/hpf   Bacteria, UA FEW (A) NONE SEEN   Squamous Epithelial / HPF 0-5 0 - 5 /HPF   Mucus PRESENT   Comprehensive metabolic panel     Status: Abnormal   Collection Time: 01/22/23  6:23 PM  Result Value Ref Range   Sodium 136 135 - 145 mmol/L   Potassium 3.5 3.5 - 5.1 mmol/L   Chloride 99 98 - 111 mmol/L   CO2 23 22 - 32 mmol/L   Glucose, Bld 165 (H) 70 - 99 mg/dL   BUN 32 (H) 6 - 20 mg/dL   Creatinine, Ser 1.61 (H) 0.44 - 1.00 mg/dL   Calcium 8.5 (L) 8.9 - 10.3 mg/dL   Total Protein 8.1 6.5 - 8.1 g/dL   Albumin 3.5 3.5 - 5.0 g/dL   AST 27 15 - 41 U/L   ALT 46 (H) 0 - 44 U/L   Alkaline Phosphatase 93 38 - 126 U/L   Total Bilirubin 0.6 0.3 - 1.2 mg/dL   GFR, Estimated 34 (L) >60 mL/min    Anion gap 14 5 - 15  Lactic acid, plasma     Status: None   Collection Time: 01/22/23  6:23 PM  Result Value Ref Range   Lactic Acid, Venous 1.0 0.5 - 1.9 mmol/L  CBC with Differential     Status: Abnormal   Collection Time: 01/22/23  6:23 PM  Result Value Ref Range   WBC 9.2 4.0 - 10.5 K/uL   RBC 4.08 3.87 - 5.11 MIL/uL   Hemoglobin 9.7 (L) 12.0 - 15.0 g/dL   HCT 91.4 (L) 78.2 - 95.6 %   MCV 77.7 (L) 80.0 - 100.0 fL   MCH 23.8 (L) 26.0 - 34.0 pg   MCHC 30.6 30.0 - 36.0 g/dL   RDW 21.3 (H) 08.6 - 57.8 %   Platelets 261 150 - 400 K/uL   nRBC 0.0 0.0 - 0.2 %   Neutrophils Relative % 83 %   Neutro Abs 7.7 1.7 - 7.7 K/uL   Lymphocytes Relative 10 %   Lymphs Abs 0.9 0.7 - 4.0 K/uL   Monocytes Relative 6 %   Monocytes Absolute 0.5 0.1 - 1.0 K/uL   Eosinophils Relative 0 %   Eosinophils Absolute 0.0 0.0 - 0.5 K/uL   Basophils Relative 0 %   Basophils Absolute 0.0 0.0 - 0.1 K/uL   Immature Granulocytes 1 %   Abs Immature Granulocytes 0.07 0.00 - 0.07 K/uL  Protime-INR     Status: None   Collection Time: 01/22/23  6:23 PM  Result Value Ref Range   Prothrombin Time 13.3 11.4 - 15.2 seconds   INR 1.0 0.8 - 1.2  SARS Coronavirus 2 by RT PCR (hospital order, performed in [facility] hospital lab) *cepheid single result test* Anterior Nasal Swab     Status: None   Collection Time: 01/22/23  8:11 PM   Specimen: Anterior Nasal Swab  Result Value Ref Range   SARS Coronavirus 2 by RT PCR NEGATIVE NEGATIVE   *Note: Due to a large number of results and/or encounters for the requested time period, some results have not been displayed. A complete set of results can be found in Results Review.     Radiological Exams on Admission: DG Chest 1 View  Result Date: 01/22/2023 CLINICAL DATA:  Sepsis EXAM: CHEST  1 VIEW COMPARISON:  X-ray 09/16/2022 and older FINDINGS: Calcified aorta. Normal cardiopericardial silhouette. No consolidation, pneumothorax or effusion. No edema. Surgical clips in the  axillary regions. Right IJ catheter with tip along the central SVC right atrial junction region. IMPRESSION: No acute cardiopulmonary disease.  Right IJ catheter. Electronically Signed   By: Karen Kays M.D.   On: 01/22/2023 18:02   DG Chest 1 View  Final Result       Lewie Chamber, MD [facility] 01/22/2023, 11:24 PM

## 2023-01-22 NOTE — Hospital Course (Signed)
[name] is a 52 yo female with extensive PMH including chronic lymphedema, hx cushings secondary to pituitary adenoma s/p resection and gamma knife complicated by adrenal insufficiency, history of Hodgkin's at age 1 s/p chemoradiation, breast cancer s/p chemotherapy and bilateral mastectomy in 2000 which is in remission, cervical cancer s/p LEEP 2002, ovarian tumor "precancerous" s/p TH and left oophorectomy in 2011 and right in 2016, hx postablative hypothyroidism, ADHD, anxiety/depression and chronic hypoxemic respiratory failure secondary to unknown etiology.   She has been followed closely by pulmonology and recently had been referred to hospice.  She was on hospice due to heart failure and respiratory failure.  She resided for approximately 5 days at the hospice home in [place] and due to stability, was discharged home and downgraded to palliative care. Upon returning home, she appears to have had some worsening and has developed high fevers with associated "shaking" not fully consistent with rigors per patient.  Therefore, she presented to the ER for further evaluation.  There was no overt fever noted in the ER but patient reported temperature up to 103 degrees at home.  She had mild tachycardia, tachypnea.  She had no urinary symptoms and urinalysis was notable only for moderate LE but only 6-10 WBC.  Normal lactic acid and no leukocytosis.  She recently had her right port replaced on 01/18/2023 with radiology.  It was not flushing/returning blood and therefore was replaced.  She has been told to have port replacements every 6 months. There has been no redness or pain associated around her port site either.  Blood cultures were obtained in the ER and she was started on vancomycin, cefepime, Flagyl and admitted for further sepsis workup.

## 2023-01-22 NOTE — Assessment & Plan Note (Signed)
-   Patient states purpose of port placement was for IV Benadryl as she is unable to take any other antiemetics - Continue IV Benadryl

## 2023-01-22 NOTE — Assessment & Plan Note (Signed)
-   on 4-5 L at home; remains at baseline

## 2023-01-22 NOTE — Assessment & Plan Note (Signed)
-   Tachycardia, tachypnea.  Currently no obvious source.  Urinalysis not impressive and patient asymptomatic.  Port site also looks benign but would be other considered potential source.  Other considered differential includes not true fever at home esp with atypical "rigor" and no fever on presentation and sepsis not impressive at this time - given her hx, reasonable to treat empirically for now and watch cultures and possibly just complete short empiric course; seems that continuing to involve palliative care at discharge would be prudent - continue vanc/cefepime for now. Monitor blood cultures and/or for any other evidence of infection

## 2023-01-22 NOTE — Assessment & Plan Note (Signed)
-  continue synthroid 

## 2023-01-22 NOTE — ED Notes (Signed)
Unable to obtain Blood Cultures. Pt refusing peripheral stick.

## 2023-01-22 NOTE — ED Notes (Signed)
Pt reports only urinating X1 in the last 2 days.

## 2023-01-22 NOTE — Assessment & Plan Note (Signed)
-   Blood pressure appropriate - In setting of potential sepsis, hold blood pressure medications

## 2023-01-22 NOTE — Assessment & Plan Note (Addendum)
-   Continue Lexapro, Wellbutrin, Lamictal, Ambien

## 2023-01-22 NOTE — Assessment & Plan Note (Signed)
-   Recently on hospice and was at hospice home of [place].  Discharged home due to stability and referred to palliative care - Will need to continue palliative care at home again upon discharge

## 2023-01-22 NOTE — ED Notes (Signed)
Pt has no veinous access for blood draw. Pt has an IJ in place.

## 2023-01-22 NOTE — Assessment & Plan Note (Addendum)
-   Continue hydrocortisone -Blood pressure normal, no indication for stress dose steroids at this time

## 2023-01-22 NOTE — Assessment & Plan Note (Signed)
Continue clonazepam. 

## 2023-01-22 NOTE — ED Provider Notes (Signed)
[facility] AT [facility] Provider Note   CSN: [id number] Arrival date & time: 01/22/23  1710     History  Chief Complaint  Patient presents with   Fever    [name] is a 52 y.o. female.  52 year old female presents with chills x 24 hours.  Patient had her right central line catheter replaced recently.  Has history of chronic respiratory failure and is on 5 L of oxygen.  Notes no change in her cough.  Has had decreased urination as well to.  Denies any dysuria.  No recent vomiting or diarrhea.  History of sepsis from unclear etiology multiple times in the past.  States that this feels like the same.  Has had severe myalgias.  Unresponsive to home medications       Home Medications Prior to Admission medications   Medication Sig Start Date End Date Taking? Authorizing Provider  albuterol (PROVENTIL) (2.5 MG/3ML) 0.083% nebulizer solution Take 3 mLs (2.5 mg total) by nebulization every 6 (six) hours as needed for wheezing or shortness of breath. 07/09/22 10/07/22  Pokhrel, Rebekah Chesterfield, MD  aspirin EC 81 MG tablet Take 1 tablet (81 mg total) by mouth daily. Swallow whole. Patient taking differently: Take 81 mg by mouth at bedtime. Chewable 07/16/22   Nahser, Deloris Ping, MD  benzonatate (TESSALON) 200 MG capsule Take 200 mg by mouth 3 (three) times daily as needed for cough.    [provider]  Bismuth Tribromoph-Petrolatum (XEROFORM OCCLUSIVE GAUZE STRIP) PADS Apply 1 each topically as directed. 09/17/22   Rolly Salter, MD  bisoprolol (ZEBETA) 5 MG tablet Take 0.5 tablets (2.5 mg total) by mouth daily. Take 0.5 tablet by mouth daily 09/19/22   Rolly Salter, MD  bumetanide (BUMEX) 1 MG tablet TAKE 1 TABLET BY MOUTH TWICE A DAY 11/20/22   Nahser, Deloris Ping, MD  buPROPion (WELLBUTRIN XL) 150 MG 24 hr tablet Take 1 tablet (150 mg total) by mouth daily. 12/24/22   Mozingo, Thereasa Solo, NP  clonazePAM (KLONOPIN) 0.5 MG tablet TAKE 1 TABLET (0.5 MG  TOTAL) BY MOUTH 3 (THREE) TIMES DAILY AS NEEDED FOR ANXIETY. 11/08/22   Mozingo, Thereasa Solo, NP  dexlansoprazole (DEXILANT) 60 MG capsule Take 60 mg by mouth daily.    [provider]  Diclofenac Potassium,Migraine, (CAMBIA) 50 MG PACK Take 50 mg by mouth daily as needed for migraine. 05/16/21   [provider]  diphenhydrAMINE (BENADRYL) 12.5 MG/5ML elixir Take 10 mLs (25 mg total) by mouth every 6 (six) hours as needed (nausea). 09/17/22   Rolly Salter, MD  DIPHENHYDRAMINE HCL IJ Inject 0.5 mLs into the muscle daily.    [provider]  dronabinol (MARINOL) 2.5 MG capsule TAKE 1 CAPSULE BY MOUTH TWICE A DAY AS NEEDED NAUSEA Patient taking differently: Take 2.5 mg by mouth at bedtime. 09/10/22   Serena Croissant, MD  EMGALITY 120 MG/ML SOSY Inject 120 mg into the skin every 28 (twenty-eight) days.    [provider]  EPINEPHRINE 0.3 mg/0.3 mL IJ SOAJ injection INJECT 0.3 MG INTO THE MUSCLE AS NEEDED FOR ANAPHYLAXIS. 01/21/23   Luciano Cutter, MD  escitalopram (LEXAPRO) 20 MG tablet TAKE 1 TABLET BY MOUTH EVERY DAY IN THE EVENING 11/01/22   Mozingo, Thereasa Solo, NP  FLORASTOR 250 MG capsule Take 250 mg by mouth See admin instructions. Take 250 mg by mouth mid-morning and mid-afternoon (1600)    [provider]  fluticasone-salmeterol (ADVAIR HFA) 230-21 MCG/ACT inhaler  INHALE 2 PUFFS INTO THE LUNGS TWICE A DAY 08/15/22   Luciano Cutter, MD  guaiFENesin (ROBITUSSIN) 100 MG/5ML liquid Take 15 mLs by mouth every 6 (six) hours as needed for cough or to loosen phlegm. 07/09/22   Pokhrel, Rebekah Chesterfield, MD  hydrocortisone (CORTEF) 10 MG tablet Take 1-2 tablets (10-20 mg total) by mouth See admin instructions. Take 20 mg in the am and 10mg  in the evening. Take after completing prednisone 09/29/22   Rolly Salter, MD  HYDROmorphone (DILAUDID) 4 MG tablet Take 1 tablet (4 mg total) by mouth every 4 (four) hours as needed. 12/28/22     HYDROmorphone (DILAUDID) 4 MG  tablet Take 1 tablet (4 mg total) by mouth every 4 (four) hours, as needed 01/11/23     lamoTRIgine (LAMICTAL) 200 MG tablet TAKE 1 TABLET BY MOUTH EVERYDAY AT BEDTIME 12/24/22   Mozingo, Thereasa Solo, NP  Lancets (ONETOUCH DELICA PLUS [place]) MISC 3 (three) times daily. for testing 06/12/21   [provider]  LORazepam (ATIVAN) 0.5 MG tablet Take 1 tablet (0.5 mg total) by mouth every 6 (six) hours as needed. 12/28/22     menthol-cetylpyridinium (CEPACOL) 3 MG lozenge Take 1 lozenge (3 mg total) by mouth as needed for sore throat. 09/17/22   Rolly Salter, MD  nitroGLYCERIN (NITROSTAT) 0.4 MG SL tablet Dissolve 1 tablet under the tongue every 5 minutes as needed for chest pain. Max of 3 doses, then 911. 08/20/22   Nahser, Deloris Ping, MD  [facility] VERIO test strip 3 (three) times daily. for testing 06/12/21   [provider]  oxyCODONE-acetaminophen (PERCOCET/ROXICET) 5-325 MG tablet Take 1 tablet by mouth every 8 (eight) hours as needed for moderate pain or severe pain. 09/17/22   Rolly Salter, MD  OXYGEN Inhale 4-5 L/min into the lungs continuous.    [provider]  potassium chloride SA (KLOR-CON M) 20 MEQ tablet Take 1 tablet (20 mEq total) by mouth daily for 3 days. 09/17/22 09/20/22  Rolly Salter, MD  predniSONE (DELTASONE) 10 MG tablet Take 40mg  daily for 3days,Take 30mg  daily for 3days,Take 20mg  daily for 3days,Take 10mg  daily for 3days, then stop and switch to cortef 09/17/22   Rolly Salter, MD  PROAIR HFA 108 [phone] Base) MCG/ACT inhaler Inhale 2 puffs into the lungs every 4 (four) hours as needed for wheezing or shortness of breath.    [provider]  QVAR REDIHALER 80 MCG/ACT inhaler INHALE 1 PUFF INTO THE LUNGS TWICE A DAY 01/21/23   Luciano Cutter, MD  Rimegepant Sulfate (NURTEC) 75 MG TBDP Take 75 mg by mouth daily as needed (Migraine).     [provider]  rosuvastatin (CRESTOR) 10 MG tablet Take 10 mg by mouth daily. Mid morning  02/28/18   [provider]  sacubitril-valsartan (ENTRESTO) 24-26 MG Take 1 tablet by mouth 2 (two) times daily. 09/22/22   Rolly Salter, MD  silver sulfADIAZINE (SILVADENE) 1 % cream Apply to affected area daily 09/17/22 09/17/23  Rolly Salter, MD  sitaGLIPtin (JANUVIA) 100 MG tablet Take 1 tablet (100 mg total) by mouth daily. 10/19/21   Serena Croissant, MD  Sodium Chloride Flush (NORMAL SALINE FLUSH) 0.9 % SOLN Use as directed for central venous catheter maintenance 10/10/22     sucralfate (CARAFATE) 1 GM/10ML suspension Take 1 g by mouth daily as needed (as directed for ulcers).    [provider]  SYNTHROID 100 MCG tablet Take 1 tablet (100 mcg total) by  mouth every morning. Patient taking differently: Take 100 mcg by mouth every morning. * Must be name brand* 10/19/21   Serena Croissant, MD  Tiotropium Bromide Monohydrate (SPIRIVA RESPIMAT) 1.25 MCG/ACT AERS Inhale 2 puffs into the lungs daily. 08/15/22   Luciano Cutter, MD  topiramate (TOPAMAX) 50 MG tablet Take 150 mg by mouth at bedtime.  12/25/19   [provider]  zolpidem (AMBIEN CR) 12.5 MG CR tablet Take 12.5 mg by mouth at bedtime. 03/05/22   [provider]      Allergies    Ferrous bisglycinate chelate [iron], Mushroom extract complex, Na ferric gluc cplx in sucrose, Cymbalta [duloxetine hcl], Hydromorphone, Ondansetron hcl, Promethazine, Succinylcholine, Buprenorphine hcl, Compazine, Duloxetine, Metoclopramide, Morphine and related, Ondansetron, Promethazine hcl, and Tegaderm ag mesh [silver]    Review of Systems   Review of Systems  All other systems reviewed and are negative.   Physical Exam Updated Vital Signs BP 93/61   Pulse 90   Temp 99.4 F (37.4 C) (Oral)   Resp 15   LMP  (LMP Unknown)   SpO2 100%  Physical Exam Vitals and nursing note reviewed.  Constitutional:      General: She is not in acute distress.    Appearance: Normal appearance. She is well-developed. She is not  toxic-appearing.  HENT:     Head: Normocephalic and atraumatic.  Eyes:     General: Lids are normal.     Conjunctiva/sclera: Conjunctivae normal.     Pupils: Pupils are equal, round, and reactive to light.  Neck:     Thyroid: No thyroid mass.     Trachea: No tracheal deviation.  Cardiovascular:     Rate and Rhythm: Normal rate and regular rhythm.     Heart sounds: Normal heart sounds. No murmur heard.    No gallop.  Pulmonary:     Effort: Pulmonary effort is normal. No respiratory distress.     Breath sounds: Normal breath sounds. No stridor. No decreased breath sounds, wheezing, rhonchi or rales.  Abdominal:     General: There is no distension.     Palpations: Abdomen is soft.     Tenderness: There is no abdominal tenderness. There is no rebound.  Musculoskeletal:        General: No tenderness. Normal range of motion.     Cervical back: Normal range of motion and neck supple.  Skin:    General: Skin is warm and dry.     Findings: No abrasion or rash.  Neurological:     Mental Status: She is alert and oriented to person, place, and time. Mental status is at baseline.     GCS: GCS eye subscore is 4. GCS verbal subscore is 5. GCS motor subscore is 6.     Cranial Nerves: No cranial nerve deficit.     Sensory: No sensory deficit.     Motor: Motor function is intact.  Psychiatric:        Attention and Perception: Attention normal.        Speech: Speech normal.        Behavior: Behavior normal.     ED Results / Procedures / Treatments   Labs (all labs ordered are listed, but only abnormal results are displayed) Labs Reviewed  COMPREHENSIVE METABOLIC PANEL - Abnormal; Notable for the following components:      Result Value   Glucose, Bld 165 (*)    BUN 32 (*)    Creatinine, Ser 1.80 (*)    Calcium 8.5 (*)  ALT 46 (*)    GFR, Estimated 34 (*)    All other components within normal limits  CBC WITH DIFFERENTIAL/PLATELET - Abnormal; Notable for the following components:    Hemoglobin 9.7 (*)    HCT 31.7 (*)    MCV 77.7 (*)    MCH 23.8 (*)    RDW 18.6 (*)    All other components within normal limits  CULTURE, BLOOD (ROUTINE X 2)  CULTURE, BLOOD (ROUTINE X 2)  LACTIC ACID, PLASMA  PROTIME-INR  LACTIC ACID, PLASMA  URINALYSIS, ROUTINE W REFLEX MICROSCOPIC    EKG None  Radiology DG Chest 1 View  Result Date: 01/22/2023 CLINICAL DATA:  Sepsis EXAM: CHEST  1 VIEW COMPARISON:  X-ray 09/16/2022 and older FINDINGS: Calcified aorta. Normal cardiopericardial silhouette. No consolidation, pneumothorax or effusion. No edema. Surgical clips in the axillary regions. Right IJ catheter with tip along the central SVC right atrial junction region. IMPRESSION: No acute cardiopulmonary disease.  Right IJ catheter. Electronically Signed   By: Karen Kays M.D.   On: 01/22/2023 18:02    Procedures Procedures    Medications Ordered in ED Medications  lactated ringers infusion (has no administration in time range)  lactated ringers bolus 1,000 mL (has no administration in time range)    And  lactated ringers bolus 1,000 mL (has no administration in time range)    And  lactated ringers bolus 1,000 mL (has no administration in time range)  ceFEPIme (MAXIPIME) 2 g in sodium chloride 0.9 % 100 mL IVPB (has no administration in time range)  metroNIDAZOLE (FLAGYL) IVPB 500 mg (has no administration in time range)  vancomycin (VANCOCIN) IVPB 1000 mg/200 mL premix (has no administration in time range)    ED Course/ Medical Decision Making/ A&P                             Medical Decision Making Amount and/or Complexity of Data Reviewed Labs: ordered.  Risk Prescription drug management.   Patient presents with concern for possible sepsis.  Started on IV antibiotics and IV fluids.  Was slightly hypotensive and blood pressure has improved.  No clear etiology at this time.  Urinalysis without infection.  Chest x-ray without evidence of infection at this time.  Lactate is  normal.  Patient recently had a right internal jugular line placed.  This could be the source.  However due to her high risk of sepsis she will require admission.  Gust with hospitalist and require admission  CRITICAL CARE Performed by: Toy Baker Total critical care time: 50 minutes Critical care time was exclusive of separately billable procedures and treating other patients. Critical care was necessary to treat or prevent imminent or life-threatening deterioration. Critical care was time spent personally by me on the following activities: development of treatment plan with patient and/or surrogate as well as nursing, discussions with consultants, evaluation of patient's response to treatment, examination of patient, obtaining history from patient or surrogate, ordering and performing treatments and interventions, ordering and review of laboratory studies, ordering and review of radiographic studies, pulse oximetry and re-evaluation of patient's condition.         Final Clinical Impression(s) / ED Diagnoses Final diagnoses:  None    Rx / DC Orders ED Discharge Orders     None         Lorre Nick, MD 01/22/23 2158

## 2023-01-22 NOTE — Progress Notes (Signed)
Pharmacy Antibiotic Note  [name] is a 52 y.o. female admitted on 01/22/2023 with sepsis.  In the ED patient received Vancomycin  IV, Flagyl  and Cefepime 2gm IV x 1 dose each.  Pharmacy has been consulted for Vancomycin and Cefepime dosing.  Plan: Cefepime 2gm IV q12h Vancomycin 1000 mg IV Q 24 hrs. Goal AUC 400-550. Expected AUC: 537.6  SCr used: 1.8 Follow renal function F/u culture results and sensitivities  Weight: 80.7 kg (178 lb)  Temp (24hrs), Avg:98.7 F (37.1 C), Min:97.9 F (36.6 C), Max:99.4 F (37.4 C)  Recent Labs  Lab 01/22/23 1823  WBC 9.2  CREATININE 1.80*  LATICACIDVEN 1.0    Estimated Creatinine Clearance: 38.8 mL/min (A) (by C-G formula based on SCr of 1.8 mg/dL (H)).    Allergies  Allergen Reactions   Ferrous Bisglycinate Chelate [Iron] Anaphylaxis and Other (See Comments)    Only IV - only FERRLICET   Mushroom Extract Complex Anaphylaxis   Na Ferric Gluc Cplx In Sucrose Anaphylaxis   Cymbalta [Duloxetine Hcl] Swelling and Anxiety   Hydromorphone Other (See Comments)    BP drop and heart rate drops 5.1.20 PT REPORTS THAT SHE TAKES DILAUDID AT HOME   Ondansetron Hcl Other (See Comments)   Promethazine Other (See Comments)    Other reaction(s): Unknown   Succinylcholine Other (See Comments)    Lock Jaw   Buprenorphine Hcl Hives   Compazine Other (See Comments)    Altered mental status Aggression   Duloxetine     Other reaction(s): Not available   Metoclopramide Other (See Comments)    Dystonia   Morphine And Related Hives   Ondansetron Hives and Rash    Other reaction(s): Unknown Other reaction(s): Unknown   Promethazine Hcl Hives   Tegaderm Ag Mesh [Silver] Rash    Old formulation only, is able to tolerate new formulation    Antimicrobials this admission: 4/23 Metronidazole x 1 4/23 Cefepime >>   4/23 Vancomycin >>    Dose adjustments this admission:    Microbiology results: 4/23 BCx:      Thank you for  allowing pharmacy to be a part of this patient's care.  Maryellen Pile, PharmD 01/22/2023 11:10 PM

## 2023-01-22 NOTE — ED Notes (Signed)
Bladder scan with 42cc

## 2023-01-22 NOTE — ED Triage Notes (Signed)
Pt has been sick since Sunday with fever and chills. Pt had temperature of 103 at home.  Pt has hx of chronic respiratory failure with hypoxia and hx of ovarian cancer, breast cancer. Last treatment 5 years ago

## 2023-01-22 NOTE — Telephone Encounter (Signed)
Called and spoke to pt. Pt sounded weak when speaking and eventually started crying. Pt c/o weakness, fatigue, fevers x 4-5 days (last night 103.9), "violent shaking" due to the fever, spo2 mid 80s, decrease urine output - pt states she only voided once yesterday and none today yet. Pt also states she fell 3 times yesterday. Advised pt and pt's husband she needs to go to the ED for eval. Pt expressed hopelessness feelings and states "what's the point", "maybe its my time", "I just want to give up". Pt and pt's husband state they will go to ED, likely [facility].    Will forward to Dr. Everardo All.

## 2023-01-22 NOTE — Assessment & Plan Note (Signed)
-   Baseline hemoglobin around 9 to 10 g/dL - Currently at baseline 

## 2023-01-22 NOTE — Assessment & Plan Note (Signed)
-   patient has history of CKD3a. Baseline creat ~ 1.3 - 1.4, eGFR~ 50 - patient presents with increase in creat >0.3 mg/dL above baseline, creat increase >1.5x baseline presumed to have occurred within past 7 days PTA - possibly prerenal from poor intake - creat 1.8 on admission - continue IVF - BMP in am - if no improvement can consider renal u/s and further workup

## 2023-01-22 NOTE — Progress Notes (Signed)
A consult was received from an ED physician for vancomycin and cefepime per pharmacy dosing.  The patient's profile has been reviewed for ht/wt/allergies/indication/available labs.    A one time order has been placed for: -Vancomycin 1.5gm x 1 dose. -Cefepime 2gm x 1 dose.  Further antibiotics/pharmacy consults should be ordered by admitting physician if indicated.                       Thank you, Josefa Half 01/22/2023  7:29 PM

## 2023-01-22 NOTE — Sepsis Progress Note (Signed)
Elink monitoring for the code sepsis protocol.  

## 2023-01-23 ENCOUNTER — Observation Stay (HOSPITAL_COMMUNITY): Payer: BC Managed Care – PPO

## 2023-01-23 DIAGNOSIS — N200 Calculus of kidney: Secondary | ICD-10-CM | POA: Diagnosis not present

## 2023-01-23 DIAGNOSIS — R918 Other nonspecific abnormal finding of lung field: Secondary | ICD-10-CM | POA: Diagnosis not present

## 2023-01-23 DIAGNOSIS — Z7982 Long term (current) use of aspirin: Secondary | ICD-10-CM | POA: Diagnosis not present

## 2023-01-23 DIAGNOSIS — E271 Primary adrenocortical insufficiency: Secondary | ICD-10-CM | POA: Diagnosis not present

## 2023-01-23 DIAGNOSIS — Z85850 Personal history of malignant neoplasm of thyroid: Secondary | ICD-10-CM

## 2023-01-23 DIAGNOSIS — J9611 Chronic respiratory failure with hypoxia: Secondary | ICD-10-CM

## 2023-01-23 DIAGNOSIS — Z79899 Other long term (current) drug therapy: Secondary | ICD-10-CM | POA: Diagnosis not present

## 2023-01-23 DIAGNOSIS — R110 Nausea: Secondary | ICD-10-CM | POA: Diagnosis not present

## 2023-01-23 DIAGNOSIS — I10 Essential (primary) hypertension: Secondary | ICD-10-CM

## 2023-01-23 DIAGNOSIS — Z7984 Long term (current) use of oral hypoglycemic drugs: Secondary | ICD-10-CM | POA: Diagnosis not present

## 2023-01-23 DIAGNOSIS — Z7189 Other specified counseling: Secondary | ICD-10-CM | POA: Diagnosis not present

## 2023-01-23 DIAGNOSIS — D509 Iron deficiency anemia, unspecified: Secondary | ICD-10-CM

## 2023-01-23 DIAGNOSIS — A419 Sepsis, unspecified organism: Secondary | ICD-10-CM | POA: Diagnosis not present

## 2023-01-23 DIAGNOSIS — F331 Major depressive disorder, recurrent, moderate: Secondary | ICD-10-CM

## 2023-01-23 DIAGNOSIS — N179 Acute kidney failure, unspecified: Secondary | ICD-10-CM | POA: Diagnosis not present

## 2023-01-23 DIAGNOSIS — F419 Anxiety disorder, unspecified: Secondary | ICD-10-CM

## 2023-01-23 LAB — CBC WITH DIFFERENTIAL/PLATELET
Abs Immature Granulocytes: 0.05 10*3/uL (ref 0.00–0.07)
Basophils Absolute: 0 10*3/uL (ref 0.0–0.1)
Basophils Relative: 0 %
Eosinophils Absolute: 0 10*3/uL (ref 0.0–0.5)
Eosinophils Relative: 0 %
HCT: 26.8 % — ABNORMAL LOW (ref 36.0–46.0)
Hemoglobin: 8.1 g/dL — ABNORMAL LOW (ref 12.0–15.0)
Immature Granulocytes: 1 %
Lymphocytes Relative: 19 %
Lymphs Abs: 1.9 10*3/uL (ref 0.7–4.0)
MCH: 23.6 pg — ABNORMAL LOW (ref 26.0–34.0)
MCHC: 30.2 g/dL (ref 30.0–36.0)
MCV: 78.1 fL — ABNORMAL LOW (ref 80.0–100.0)
Monocytes Absolute: 1.1 10*3/uL — ABNORMAL HIGH (ref 0.1–1.0)
Monocytes Relative: 11 %
Neutro Abs: 6.7 10*3/uL (ref 1.7–7.7)
Neutrophils Relative %: 69 %
Platelets: 248 10*3/uL (ref 150–400)
RBC: 3.43 MIL/uL — ABNORMAL LOW (ref 3.87–5.11)
RDW: 18.5 % — ABNORMAL HIGH (ref 11.5–15.5)
WBC: 9.8 10*3/uL (ref 4.0–10.5)
nRBC: 0 % (ref 0.0–0.2)

## 2023-01-23 LAB — BASIC METABOLIC PANEL
Anion gap: 10 (ref 5–15)
BUN: 20 mg/dL (ref 6–20)
CO2: 23 mmol/L (ref 22–32)
Calcium: 8.2 mg/dL — ABNORMAL LOW (ref 8.9–10.3)
Chloride: 104 mmol/L (ref 98–111)
Creatinine, Ser: 1.28 mg/dL — ABNORMAL HIGH (ref 0.44–1.00)
GFR, Estimated: 51 mL/min — ABNORMAL LOW (ref >60)
Glucose, Bld: 116 mg/dL — ABNORMAL HIGH (ref 70–99)
Potassium: 3.2 mmol/L — ABNORMAL LOW (ref 3.5–5.1)
Sodium: 137 mmol/L (ref 135–145)

## 2023-01-23 LAB — PROTIME-INR
INR: 1.1 (ref 0.8–1.2)
Prothrombin Time: 13.8 seconds (ref 11.4–15.2)

## 2023-01-23 LAB — PROCALCITONIN: Procalcitonin: 4.97 ng/mL

## 2023-01-23 LAB — MAGNESIUM: Magnesium: 2.1 mg/dL (ref 1.7–2.4)

## 2023-01-23 LAB — LACTIC ACID, PLASMA: Lactic Acid, Venous: 1 mmol/L (ref 0.5–1.9)

## 2023-01-23 MED ORDER — IOHEXOL 9 MG/ML PO SOLN
500.0000 mL | ORAL | Status: AC
  Administered 2023-01-23: 500 mL via ORAL

## 2023-01-23 MED ORDER — IOHEXOL 9 MG/ML PO SOLN
ORAL | Status: AC
  Filled 2023-01-23: qty 1000

## 2023-01-23 MED ORDER — DIPHENHYDRAMINE HCL 50 MG/ML IJ SOLN
25.0000 mg | Freq: Once | INTRAMUSCULAR | Status: AC
  Administered 2023-01-23: 25 mg via INTRAVENOUS
  Filled 2023-01-23: qty 1

## 2023-01-23 MED ORDER — CHLORHEXIDINE GLUCONATE CLOTH 2 % EX PADS: 6.0000 | MEDICATED_PAD | Freq: Every day | CUTANEOUS | Status: DC

## 2023-01-23 MED ORDER — ALBUTEROL SULFATE (2.5 MG/3ML) 0.083% IN NEBU
2.5000 mg | INHALATION_SOLUTION | RESPIRATORY_TRACT | Status: DC | PRN
  Administered 2023-01-23: 2.5 mg via RESPIRATORY_TRACT
  Filled 2023-01-23: qty 3

## 2023-01-23 MED ORDER — DIPHENHYDRAMINE HCL 25 MG PO CAPS
25.0000 mg | ORAL_CAPSULE | Freq: Four times a day (QID) | ORAL | Status: AC | PRN
  Administered 2023-01-23: 25 mg via ORAL
  Filled 2023-01-23: qty 1

## 2023-01-23 MED ORDER — SODIUM CHLORIDE (PF) 0.9 % IJ SOLN
INTRAMUSCULAR | Status: AC
  Filled 2023-01-23: qty 50

## 2023-01-23 MED ORDER — DIPHENHYDRAMINE HCL 12.5 MG/5ML PO ELIX
25.0000 mg | ORAL_SOLUTION | Freq: Four times a day (QID) | ORAL | Status: DC | PRN
  Filled 2023-01-23: qty 10

## 2023-01-23 MED ORDER — HYDROCORTISONE 10 MG PO TABS
10.0000 mg | ORAL_TABLET | Freq: Every day | ORAL | Status: DC
  Administered 2023-01-23: 10 mg via ORAL
  Filled 2023-01-23 (×2): qty 1

## 2023-01-23 MED ORDER — LORAZEPAM 0.5 MG PO TABS
0.5000 mg | ORAL_TABLET | Freq: Once | ORAL | Status: AC
  Administered 2023-01-23: 0.5 mg via ORAL
  Filled 2023-01-23: qty 1

## 2023-01-23 MED ORDER — SODIUM CHLORIDE 0.9% FLUSH: 10.0000 mL | INTRAVENOUS | Status: DC | PRN

## 2023-01-23 MED ORDER — DIPHENHYDRAMINE HCL 50 MG/ML IJ SOLN
25.0000 mg | Freq: Four times a day (QID) | INTRAMUSCULAR | Status: AC | PRN
  Administered 2023-01-23: 25 mg via INTRAVENOUS
  Filled 2023-01-23: qty 1

## 2023-01-23 MED ORDER — IOHEXOL 300 MG/ML  SOLN
100.0000 mL | Freq: Once | INTRAMUSCULAR | Status: AC | PRN
  Administered 2023-01-23: 100 mL via INTRAVENOUS

## 2023-01-23 NOTE — Progress Notes (Signed)
Patient complained of Increasing Chest Pain and shortness of Breath. States that she had pain in the ED but not this intense. Notified Provider, Vitals obtained. Notified Press photographer and Rapid response nurse. RRT nurse at bedside, obtained EKG and notified Respiratory for albuterol treatment.

## 2023-01-23 NOTE — Significant Event (Signed)
Rapid Response Event Note   Reason for Call :  Chest pain and shortness of breath.  Initial Focused Assessment:  Patient is alert, oriented x 4, speaking in full sentences, and receiving a nebulizer treatment when I arrived. Respiratory therapist at bedside. Patient described chest pain going up and down mid-chest and around the left side to her axilla. Lung sounds with a little bilateral rhonchi. Patient had received dose of Ativan prior to my arrival for anxiety. Patient near tearful and speaking about death, hospice, and her medical journey. After EKG and nebulizer completed, patient shifted to talking about her son and showing Statistician on her phone. Vital signs are unremarkable and patient is afebrile.   Interventions:  EKG, VS, chart review, provider notified, and focused assessment completed.   Plan of Care:  Patient stated that having someone to talk to helped her not feel chest pain and nebulizer treatment has eased her breathing difficulty. Encouraged rest and notifying bedside nurse if she begins to feel increased anxiety or discomfort. Advised patient that she still has anxiety meds that can be administered if needed. Patient to remain in current level of care at this time. Patient and bedside RN agree with this plan.  Event Summary:   MD Notified: Johann Capers, NP Call Time: 0222 Arrival Time: 0228 End Time: 1610  Lamona Curl, RN

## 2023-01-23 NOTE — Telephone Encounter (Signed)
Agree. Patient seen in the ED

## 2023-01-23 NOTE — Progress Notes (Signed)
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    She has recently been referred to hospice and admitted to hospice house for upwards of a month, due to her stability she was ultimately discharged home and downgraded to palliative care.  At some point while at home patient had notable high-grade fever as high as 103 and had episodes of shaking/rigors as well as notable profound chills  She recently had her right port replaced on 01/18/2023 with radiology.  It was not flushing/returning blood and therefore was replaced.  She denies any redness or pain associated around her port site either.   Assessment & Plan:   Principal Problem:   Sepsis Active Problems:   Chronic nausea   Acute renal failure superimposed on stage 3a chronic kidney disease   Addison's disease   Goals of care, counseling/discussion   Chronic respiratory failure with hypoxia   History of thyroid cancer   Hypertension   Major depressive disorder, recurrent episode, moderate   Anxiety and depression   Microcytic anemia   Acute toxic encephalpathy -Patient's mental status is waxing/waning throughout the day -Multiple IV medications given per her regimen -Discussed appropriate use/dose/timing   Goals of care -Patient still follows with palliative  care -Recently discharged from hospice house - recommend reaching out to see if they will re-evaluate her  SIRS without source - Tachycardia, tachypnea.  Currently no obvious source.  Urinalysis not impressive and patient is completely asymptomatic other than generalized malaise.  - Port site unremarkable - Possibly no true fever at home and rigors are secondary to another source (not yet identified) - Given all these negative findings will hold antibiotics for 24h to follow for fever curve or complaints   Acute renal failure superimposed on stage 3a chronic kidney disease, resolved - patient has history of CKD3a. Baseline creat ~ 1.3 - 1.4, eGFR~ 50 - patient presents with increase in creat >0.3 mg/dL above baseline, creat increase >1.5x baseline  - Likely prerenal - resolved with IVF alone    Chronic nausea - Patient states purpose of port placement was for IV Benadryl as she is unable to take any other antiemetics - notes allergies to multiple medications - including routine antiemetics - Continue IV Benadryl - patient markedly somnolent today - monitor for polypharmacy   Addison's disease - Continue hydrocortisone -Blood pressure normal, no indication for stress dose steroids at this time   Chronic respiratory failure with hypoxia - on 4-5 L at home; remains at baseline   Microcytic anemia - Baseline hemoglobin around 9 to 10 g/dL - Currently at baseline   Anxiety and depression - Continue clonazepam   Major depressive disorder, recurrent episode, moderate - Continue Lexapro, Wellbutrin, Lamictal, Ambien   Hypertension - Blood pressure appropriate - In setting of potential sepsis, hold blood pressure medications   History of thyroid cancer - continue synthroid   DVT prophylaxis: lovenox Code Status: DNR  Family Communication: Husband over the phone  Status is: Observation  Dispo: The patient is from: Home              Anticipated d/c is to: TBD               Anticipated d/c date is: 24-48h pending clinical coursel              Patient currently NOT medically stable for discharge  Consultants:  Palliative  Procedures:  None  Antimicrobials:  Discontinued 4/24    Subjective: Patient has multiple complaints - fatigue, weakness, lethargy, nausea, generalized pain but denies any specific complaints/findings consistent with infection.  Objective: Vitals:   01/23/23 0602 01/23/23 0753 01/23/23 1021 01/23/23 1204  BP: (!) 108/59 (!) 94/52  100/60  Pulse: 97 95 86 85  Resp: Temp:  98.4 F (36.9 C)  97.6 F (36.4 C)  TempSrc:  Oral    SpO2: 96% 98% 99% 100%  Weight:      Height:        Intake/Output Summary (Last 24 hours) at 01/23/2023 1507 Last data filed at 01/23/2023 1320 Gross per 24 hour  Intake 4574.99 ml  Output 1250 ml  Net 3324.99 ml   Filed Weights   01/22/23 1930  Weight: 80.7 kg    Examination:  General exam: Appears calm and comfortable  Respiratory system: Clear to auscultation. Respiratory effort normal. Cardiovascular system: S1 & S2 heard, RRR. No JVD, murmurs, rubs, gallops or clicks. No pedal edema. Gastrointestinal system: Abdomen is nondistended, soft and nontender. No organomegaly or masses felt. Normal bowel sounds heard. Central nervous system: Alert and oriented. No focal neurological deficits. Extremities: Symmetric 5 x 5 power. Skin: No rashes, lesions or ulcers Psychiatry: Judgement and insight appear normal. Mood & affect appropriate.     Data Reviewed: I have personally reviewed following labs and imaging studies  CBC: Recent Labs  Lab 01/22/23 1823 01/23/23 0542  WBC 9.2 9.8  NEUTROABS 7.7 6.7  HGB 9.7* 8.1*  HCT 31.7* 26.8*  MCV 77.7* 78.1*  PLT 261 248   Basic Metabolic Panel: Recent Labs  Lab 01/22/23 1823 01/23/23 0542  NA 136 137  K 3.5 3.2*  CL 99 104  CO2 23 23  GLUCOSE 165* 116*  BUN 32* 20  CREATININE 1.80* 1.28*  CALCIUM 8.5* 8.2*  MG  --  2.1    GFR: Estimated Creatinine Clearance: 54.6 mL/min (A) (by C-G formula based on SCr of 1.28 mg/dL (H)). Liver Function Tests: Recent Labs  Lab 01/22/23 1823  AST 27  ALT 46*  ALKPHOS 93  BILITOT 0.6  PROT 8.1  ALBUMIN 3.5   No results for input(s): "LIPASE", "AMYLASE" in the last 168 hours. No results for input(s): "AMMONIA" in the last 168 hours. Coagulation Profile: Recent Labs  Lab 01/22/23 1823 01/23/23 0542  INR 1.0 1.1   Cardiac Enzymes: No results for input(s): "CKTOTAL", "CKMB", "CKMBINDEX", "TROPONINI" in the last 168 hours. BNP (last 3 results) No results for input(s): "PROBNP" in the last 8760 hours. HbA1C: No results for input(s): "HGBA1C" in the last 72 hours. CBG: No results for input(s): "GLUCAP" in the last 168 hours. Lipid Profile: No results for input(s): "CHOL", "HDL", "LDLCALC", "TRIG", "CHOLHDL", "LDLDIRECT" in the last 72 hours. Thyroid Function Tests: No results for input(s): "TSH", "T4TOTAL", "FREET4", "T3FREE", "THYROIDAB" in the last 72 hours. Anemia Panel: No results for input(s): "VITAMINB12", "FOLATE", "FERRITIN", "TIBC", "IRON", "RETICCTPCT" in the last 72  hours. Sepsis Labs: Recent Labs  Lab 01/22/23 1823 01/23/23 0542  PROCALCITON  --  4.97  LATICACIDVEN 1.0 1.0    Recent Results (from the past 240 hour(s))  SARS Coronavirus 2 by RT PCR (hospital order, performed in [facility] hospital lab) *cepheid single result test* Anterior Nasal Swab     Status: None   Collection Time: 01/22/23  8:11 PM   Specimen: Anterior Nasal Swab  Result Value Ref Range Status   SARS Coronavirus 2 by RT PCR NEGATIVE NEGATIVE Final    Comment: (NOTE) SARS-CoV-2 target nucleic acids are NOT DETECTED.  The SARS-CoV-2 RNA is generally detectable in upper and lower respiratory specimens during the acute phase of infection. The lowest concentration of SARS-CoV-2 viral copies this assay can detect is 250 copies / mL. A negative result does not preclude  SARS-CoV-2 infection and should not be used as the sole basis for treatment or other patient management decisions.  A negative result may occur with improper specimen collection / handling, submission of specimen other than nasopharyngeal swab, presence of viral mutation(s) within the areas targeted by this assay, and inadequate number of viral copies (<250 copies / mL). A negative result must be combined with clinical observations, patient history, and epidemiological information.  Fact Sheet for Patients:   RoadLapTop.co.za  Fact Sheet for Healthcare Providers: http://kim-miller.com/  This test is not yet approved or  cleared by the [place] FDA and has been authorized for detection and/or diagnosis of SARS-CoV-2 by FDA under an Emergency Use Authorization (EUA).  This EUA will remain in effect (meaning this test can be used) for the duration of the COVID-19 declaration under Section 564(b)(1) of the Act, 21 U.S.C. section 360bbb-3(b)(1), unless the authorization is terminated or revoked sooner.  Performed at [facility], [address] [place], [place] [address]          Radiology Studies: DG Chest 1 View  Result Date: 01/22/2023 CLINICAL DATA:  Sepsis EXAM: CHEST  1 VIEW COMPARISON:  X-ray 09/16/2022 and older FINDINGS: Calcified aorta. Normal cardiopericardial silhouette. No consolidation, pneumothorax or effusion. No edema. Surgical clips in the axillary regions. Right IJ catheter with tip along the central SVC right atrial junction region. IMPRESSION: No acute cardiopulmonary disease.  Right IJ catheter. Electronically Signed   By: Karen Kays M.D.   On: 01/22/2023 18:02        Scheduled Meds:  bumetanide  1 mg Oral BID   buPROPion  150 mg Oral Daily   Chlorhexidine Gluconate Cloth  6 each Topical Daily   enoxaparin (LOVENOX) injection  40 mg Subcutaneous Q24H   escitalopram  20 mg Oral QHS    hydrocortisone  10 mg Oral QHS   hydrocortisone  20 mg Oral Daily   lamoTRIgine  200 mg Oral QHS   levothyroxine  100 mcg Oral q morning   mometasone-formoterol  2 puff Inhalation BID   sodium chloride flush  3 mL Intravenous Q12H   topiramate  150 mg Oral QHS   umeclidinium bromide  1 puff Inhalation Daily   Continuous Infusions:  lactated ringers 150 mL/hr at 01/23/23 0029     LOS: 0 days   Time spent:  Azucena Fallen, DO [facility]  If 7PM-7AM, please contact night-coverage [email or web address]  01/23/2023, 3:07 PM

## 2023-01-23 NOTE — Progress Notes (Signed)
The phlebotomist arrived to obtain blood cultures and the pt refused lab draw. The pt stated that she has experienced difficulty with lab draws previously. Provider updated.

## 2023-01-23 NOTE — Sepsis Progress Note (Deleted)
Notified bedside nurse of need to draw blood cultures.  Still showing as not collected in Epic.

## 2023-01-23 NOTE — Sepsis Progress Note (Signed)
Patient refused blood cultures to be drawn.  Antibiotics given.

## 2023-01-23 NOTE — TOC Initial Note (Signed)
Transition of Care [facility]) - Initial/Assessment Note    Patient Details  Name: [name] MRN: [id number] Date of Birth: May 15, 1971  [facility] CM/SW Contact:    Lanier Clam, RN Phone Number: 01/23/2023, 3:40 PM  Clinical Narrative:  From home. Active w/[facility] home 02-has travel tank. Has own transport home.Continue to monitor.                 Expected Discharge Plan: Home/Self Care Barriers to Discharge: Continued Medical Work up   Patient Goals and CMS Choice Patient states their goals for this hospitalization and ongoing recovery are:: Home CMS Medicare.gov Compare Post Acute Care list provided to:: Patient Represenative (must comment) Choice offered to / list presented to : Spouse [place] ownership interest in [facility].provided to:: Spouse    Expected Discharge Plan and Services   Discharge Planning Services: CM Consult   Living arrangements for the past 2 months: Single Family Home                                      Prior Living Arrangements/Services Living arrangements for the past 2 months: Single Family Home Lives with:: Spouse              Current home services: DME ([facility] home 02-has travel tank)    Activities of Daily Living Home Assistive Devices/Equipment: None ADL Screening (condition at time of admission) Patient's cognitive ability adequate to safely complete daily activities?: Yes Is the patient deaf or have difficulty hearing?: No Does the patient have difficulty seeing, even when wearing glasses/contacts?: No Does the patient have difficulty concentrating, remembering, or making decisions?: No Patient able to express need for assistance with ADLs?: Yes Does the patient have difficulty dressing or bathing?: No Independently performs ADLs?: Yes (appropriate for developmental age) Does the patient have difficulty walking or climbing stairs?: No Weakness of Legs: None Weakness of Arms/Hands:  None  Permission Sought/Granted                  Emotional Assessment              Admission diagnosis:  Febrile illness [R50.9] Sepsis [A41.9] Patient Active Problem List   Diagnosis Date Noted   Goals of care, counseling/discussion 01/22/2023   Microcytic anemia 01/22/2023   Cellulitis 09/13/2022   Grade I diastolic dysfunction 09/13/2022   Excessive daytime sleepiness 08/01/2022   Community acquired pneumonia    Severe persistent asthma with acute exacerbation    Anxiety    Anxiety and depression 04/06/2022   GERD without esophagitis 04/06/2022   Sepsis 04/06/2022   PICC (peripherally inserted central catheter) in place    Bacteremia due to Enterobacter species 03/29/2022   Normocytic anemia 03/29/2022   Major depressive disorder, recurrent episode, moderate    Generalized anxiety disorder    Seizure-like activity 12/08/2020   High anion gap metabolic acidosis 12/08/2020   Hypokalemia 12/08/2020   Acute renal failure superimposed on stage 3a chronic kidney disease 04/27/2020   Vasculopathy 04/27/2020   Bacteremia 04/19/2020   Addison's disease    Chronic nausea 04/01/2020   Generalized weakness 02/10/2020   Headache 02/10/2020   Subcutaneous nodule 02/01/2020   Port-A-Cath in place 09/09/2019   Moderate persistent asthma without complication 08/09/2019   Fever 07/21/2019   Superficial thrombophlebitis 07/16/2019   Injury of left index finger 12/24/2018   History of pituitary adenoma 12/23/2018  History of cervical cancer 12/23/2018   Chronic respiratory failure with hypoxia 12/23/2018   SOB (shortness of breath)    Nephrolithiasis 11/07/2018   Oxygen dependent 11/07/2018   Migraine 11/07/2018   PVC's (premature ventricular contractions) 10/27/2018   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   Right heart failure 04/17/2017   Hyperlipidemia 03/09/2014   Hx of Hodgkins lymphoma    History of ductal carcinoma in situ (DCIS) of breast    History of  thyroid cancer    Adrenal insufficiency (HCC)    Hypertension    Raynaud phenomenon    GERD (gastroesophageal reflux disease)    PCP:  Audery Amel, MD Pharmacy:   [facility] [phone] [place], [place] - [address] [place] [address] Phone: [phone] Fax: [phone]  [id number] [place], [place] - [address] AT [address] [place] [address] Phone: [phone] Fax: [phone]     Social Determinants of Health (SDOH) Social History: SDOH Screenings   Food Insecurity: No Food Insecurity (01/23/2023)  Housing: Low Risk  (01/23/2023)  Transportation Needs: No Transportation Needs (01/23/2023)  Utilities: Not At Risk (01/23/2023)  Depression (PHQ2-9): Low Risk  (06/14/2022)  Tobacco Use: Low Risk  (01/22/2023)   SDOH Interventions:     Readmission Risk Interventions    09/14/2022   12:19 PM 07/02/2022    1:15 PM 07/02/2022   11:32 AM  Readmission Risk Prevention Plan  Transportation Screening Complete Complete Complete  PCP or Specialist Appt within 5-7 Days  Complete Complete  Home Care Screening  Complete Complete  Medication Review (RN CM)  Complete Complete  Medication Review (RN Care Manager) Complete    PCP or Specialist appointment within 3-5 days of discharge Complete    HRI or Home Care Consult Complete    SW Recovery Care/Counseling Consult Complete    Palliative Care Screening Not Applicable    Skilled Nursing Facility Not Applicable

## 2023-01-24 DIAGNOSIS — E271 Primary adrenocortical insufficiency: Secondary | ICD-10-CM | POA: Diagnosis not present

## 2023-01-24 DIAGNOSIS — I10 Essential (primary) hypertension: Secondary | ICD-10-CM | POA: Diagnosis not present

## 2023-01-24 DIAGNOSIS — R110 Nausea: Secondary | ICD-10-CM | POA: Diagnosis not present

## 2023-01-24 DIAGNOSIS — A419 Sepsis, unspecified organism: Secondary | ICD-10-CM | POA: Diagnosis not present

## 2023-01-24 DIAGNOSIS — N179 Acute kidney failure, unspecified: Secondary | ICD-10-CM | POA: Diagnosis not present

## 2023-01-24 MED ORDER — PANTOPRAZOLE SODIUM 40 MG PO TBEC
40.0000 mg | DELAYED_RELEASE_TABLET | Freq: Every day | ORAL | Status: DC
  Administered 2023-01-24: 40 mg via ORAL
  Filled 2023-01-24: qty 1

## 2023-01-24 MED ORDER — SUCRALFATE 1 GM/10ML PO SUSP
1.0000 g | Freq: Every day | ORAL | Status: DC | PRN
  Administered 2023-01-24: 1 g via ORAL
  Filled 2023-01-24: qty 10

## 2023-01-24 MED ORDER — HYDROXYZINE HCL 25 MG PO TABS: 25.0000 mg | ORAL_TABLET | Freq: Four times a day (QID) | ORAL | 0 refills | Status: DC | PRN

## 2023-01-24 MED ORDER — SUCRALFATE 1 GM/10ML PO SUSP
1.0000 g | Freq: Every day | ORAL | Status: AC | PRN
  Administered 2023-01-24: 1 g via ORAL
  Filled 2023-01-24: qty 10

## 2023-01-24 MED ORDER — HEPARIN SOD (PORK) LOCK FLUSH 100 UNIT/ML IV SOLN
250.0000 [IU] | INTRAVENOUS | Status: AC | PRN
  Administered 2023-01-24: 250 [IU]
  Filled 2023-01-24: qty 2.5

## 2023-01-24 NOTE — Progress Notes (Signed)
Chaplain met with [name] to provide support prior to her discharge.  She was tearful and reflecting on her life and what it means that she is coming to the end of her life.  She does not want to be a burden on anyone and so has some tendencies to push people away.  Chaplain provided reflective listening through life review and encouraged her to let people love her and care for her.  She is very concerned about her son who has special needs as well as her brother, who talks with her when he needs help and support. Chaplain encouraged her to see that they have other people who love and care for them as well.  She was grateful to have the chance to cry and release some of what she has been holding.  [address], Bcc Pager, [phone]

## 2023-01-24 NOTE — Progress Notes (Signed)
  Daily Progress Note   Patient Name: [name]       Date: 01/24/2023 DOB: Jun 17, 1971  Age: 52 y.o. MRN#: [id number] Attending Physician: Azucena Fallen, MD Primary Care Physician: Audery Amel, MD Admit Date: 01/22/2023 Length of Stay: 0 days  Discussed care with primary hospitalist today.  Patient has been followed by [facility] her care palliative care group.  Able to coordinate discussion between hospitalist and [facility] care liaison.  As per hospitalist, patient is appropriate medically for discharge today.  [facility] care able to see patient today and will plan to continue home palliative visits to support discussions moving forward.  At this time, discussed no inpatient palliative medicine team needs.  Please reach out if acute needs arise.  Thank you.  Alvester Morin, DO Palliative Care Provider [phone]

## 2023-01-24 NOTE — Progress Notes (Signed)
[facility] [id number] [facility]) Hospital Liaison Note  This patient is currently enrolled in [facility] outpatient-based palliative care.  Notified by MD of patient admission with plans to discharge today.  Rounded on patient with husband at bedside to confirm services.  [facility] will contact husband to set up visit for patient post-discharge.  Husband aware of who to contact with questions.  Please call with any Palliative Care concerns or questions.  Doreatha Martin, RN, BSN [facility] Liaison

## 2023-01-24 NOTE — Discharge Summary (Signed)
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   Patient recently discharged from hospice house given stability, returned home and plan was to follow-up with palliative care.  Patient had episodes of chills and "rigors" and reports at home fevers of 103.  Patient had a completely unremarkable workup here in the hospital with negative cultures unremarkable labs, negative imaging and no symptoms or complaints other than "general fatigue and weakness".  Given nonspecific findings antibiotics were held at intake to encourage fevers or symptoms to arise which did not occur.  Discussed with patient and family today, after meeting with palliative care here with plan to follow-up outpatient they are otherwise agreeable for discharge home.  Patient's prognosis remains poor but she is stable at this time, on baseline oxygen without any specific complaints and negative findings as above.   No medication changes during his hospital stay, see below for a list the patient's home medications as set up previously through hospice and palliative care.   Discharge Diagnoses:  Principal Problem:   Sepsis Active Problems:   Chronic nausea   Acute renal failure superimposed on stage 3a chronic kidney disease   Addison's disease   Goals of care, counseling/discussion   Chronic respiratory failure with hypoxia   History of thyroid cancer   Hypertension   Major depressive disorder, recurrent episode, moderate   Anxiety and depression   Microcytic anemia    Discharge Instructions   Allergies as of 01/24/2023       Reactions   Ferrous Bisglycinate Chelate [iron] Anaphylaxis, Other (See Comments)   Only IV - only FERRLICET   Mushroom Extract Complex Anaphylaxis   Na Ferric Gluc Cplx In Sucrose Anaphylaxis   Cymbalta [duloxetine Hcl] Swelling, Anxiety   Hydromorphone Other (See Comments)   BP drop and heart rate drops 5.1.20 PT REPORTS THAT SHE TAKES DILAUDID AT HOME   Ondansetron Hcl Other (See Comments)   Promethazine Other (See Comments)   Other reaction(s): Unknown   Succinylcholine Other (See Comments)   Lock Jaw   Buprenorphine Hcl Hives   Compazine Other (See Comments)   Altered mental status Aggression   Duloxetine    Other reaction(s): Not available   Metoclopramide Other (See Comments)   Dystonia   Morphine And Related Hives   Ondansetron Hives, Rash   Other reaction(s): Unknown Other reaction(s): Unknown   Promethazine Hcl Hives   Tegaderm Ag Mesh [silver] Rash   Old formulation only, is able to tolerate new formulation        Medication List     STOP taking these medications    bisoprolol 5 MG tablet Commonly known  as: ZEBETA   LORazepam 0.5 MG tablet Commonly known as: Ativan   oxyCODONE-acetaminophen 5-325 MG tablet Commonly known as: PERCOCET/ROXICET   potassium chloride SA 20 MEQ tablet Commonly known as: KLOR-CON M   predniSONE  10 MG tablet Commonly known as: DELTASONE       TAKE these medications    aspirin EC 81 MG tablet Take 1 tablet (81 mg total) by mouth White. Swallow whole. What changed:  when to take this additional instructions   bumetanide 1 MG tablet Commonly known as: BUMEX TAKE 1 TABLET BY MOUTH TWICE A DAY What changed: when to take this   buPROPion 150 MG 24 hr tablet Commonly known as: WELLBUTRIN XL Take 1 tablet (150 mg total) by mouth White.   Cambia 50 MG Pack Generic drug: Diclofenac Potassium(Migraine) Take 50 mg by mouth White as needed for migraine.   clonazePAM 0.5 MG tablet Commonly known as: KLONOPIN TAKE 1 TABLET (0.5 MG TOTAL) BY MOUTH 3 (THREE) TIMES White AS NEEDED FOR ANXIETY.   dexlansoprazole 60 MG capsule Commonly known as: DEXILANT Take 60 mg by mouth White.   diphenhydrAMINE 12.5 MG/5ML elixir Commonly known as: BENADRYL Take 10 mLs (25 mg total) by mouth every 6 (six) hours as needed (nausea).   dronabinol 2.5 MG capsule Commonly known as: MARINOL TAKE 1 CAPSULE BY MOUTH TWICE A DAY AS NEEDED NAUSEA What changed: See the new instructions.   Emgality 120 MG/ML Sosy Generic drug: Galcanezumab-gnlm Inject 120 mg into the skin every 28 (twenty-eight) days.   Entresto 24-26 MG Generic drug: sacubitril-valsartan Take 1 tablet by mouth 2 (two) times White.   EPINEPHrine 0.3 mg/0.3 mL Soaj injection Commonly known as: EPI-PEN INJECT 0.3 MG INTO THE MUSCLE AS NEEDED FOR ANAPHYLAXIS.   escitalopram 20 MG tablet Commonly known as: LEXAPRO TAKE 1 TABLET BY MOUTH EVERY DAY IN THE EVENING   Florastor 250 MG capsule Generic drug: saccharomyces boulardii Take 250 mg by mouth See admin instructions. Take 250 mg by mouth mid-morning and mid-afternoon (1600)   fluticasone-salmeterol 230-21 MCG/ACT inhaler Commonly known as: ADVAIR HFA INHALE 2 PUFFS INTO THE LUNGS TWICE A DAY What changed:  how much to take how to take this when to take  this additional instructions   guaiFENesin 100 MG/5ML liquid Commonly known as: ROBITUSSIN Take 15 mLs by mouth every 6 (six) hours as needed for cough or to loosen phlegm.   hydrocortisone 10 MG tablet Commonly known as: CORTEF Take 1-2 tablets (10-20 mg total) by mouth See admin instructions. Take 20 mg in the am and 10mg  in the evening. Take after completing prednisone   HYDROmorphone 4 MG tablet Commonly known as: Dilaudid Take 1 tablet (4 mg total) by mouth every 4 (four) hours as needed.   HYDROmorphone 4 MG tablet Commonly known as: Dilaudid Take 1 tablet (4 mg total) by mouth every 4 (four) hours, as needed   hydrOXYzine 25 MG tablet Commonly known as: ATARAX Take 1 tablet (25 mg total) by mouth every 6 (six) hours as needed for anxiety.   lamoTRIgine 200 MG tablet Commonly known as: LAMICTAL TAKE 1 TABLET BY MOUTH EVERYDAY AT BEDTIME What changed:  how much to take how to take this when to take this additional instructions   menthol-cetylpyridinium 3 MG lozenge Commonly known as: CEPACOL Take 1 lozenge (3 mg total) by mouth as needed for sore throat.   nitroGLYCERIN 0.4 MG SL tablet Commonly known as: NITROSTAT Dissolve 1 tablet under the tongue every 5  minutes as needed for chest pain. Max of 3 doses, then 911.   Normal Saline Flush 0.9 % Soln Use as directed for central venous catheter maintenance   Nurtec 75 MG Tbdp Generic drug: Rimegepant Sulfate Take 75 mg by mouth White as needed (Migraine).   OneTouch Delica Plus Lancet33G Misc 3 (three) times White. for testing   OneTouch Verio test strip Generic drug: glucose blood 3 (three) times White. for testing   OXYGEN Inhale 4-5 L/min into the lungs continuous.   ProAir HFA 108 (90 Base) MCG/ACT inhaler Generic drug: albuterol Inhale 2 puffs into the lungs every 4 (four) hours as needed for wheezing or shortness of breath.   albuterol (2.5 MG/3ML) 0.083% nebulizer solution Commonly known as:  PROVENTIL Take 3 mLs (2.5 mg total) by nebulization every 6 (six) hours as needed for wheezing or shortness of breath.   Qvar RediHaler 80 MCG/ACT inhaler Generic drug: beclomethasone INHALE 1 PUFF INTO THE LUNGS TWICE A DAY What changed: See the new instructions.   rosuvastatin 10 MG tablet Commonly known as: CRESTOR Take 10 mg by mouth White. Mid morning   silver sulfADIAZINE 1 % cream Commonly known as: SILVADENE Apply to affected area White   sitaGLIPtin 100 MG tablet Commonly known as: Januvia Take 1 tablet (100 mg total) by mouth White.   Spiriva Respimat 1.25 MCG/ACT Aers Generic drug: Tiotropium Bromide Monohydrate Inhale 2 puffs into the lungs White.   sucralfate 1 GM/10ML suspension Commonly known as: CARAFATE Take 1 g by mouth White as needed (as directed for ulcers).   Synthroid 100 MCG tablet Generic drug: levothyroxine Take 1 tablet (100 mcg total) by mouth every morning. What changed: additional instructions   topiramate 50 MG tablet Commonly known as: TOPAMAX Take 150 mg by mouth at bedtime.   Xeroform Occlusive Gauze Strip Pads Apply 1 each topically as directed.   zolpidem 12.5 MG CR tablet Commonly known as: AMBIEN CR Take 12.5 mg by mouth at bedtime.        Allergies  Allergen Reactions   Ferrous Bisglycinate Chelate [Iron] Anaphylaxis and Other (See Comments)    Only IV - only FERRLICET   Mushroom Extract Complex Anaphylaxis   Na Ferric Gluc Cplx In Sucrose Anaphylaxis   Cymbalta [Duloxetine Hcl] Swelling and Anxiety   Hydromorphone Other (See Comments)    BP drop and heart rate drops 5.1.20 PT REPORTS THAT SHE TAKES DILAUDID AT HOME   Ondansetron Hcl Other (See Comments)   Promethazine Other (See Comments)    Other reaction(s): Unknown   Succinylcholine Other (See Comments)    Lock Jaw   Buprenorphine Hcl Hives   Compazine Other (See Comments)    Altered mental status Aggression   Duloxetine     Other reaction(s): Not available    Metoclopramide Other (See Comments)    Dystonia   Morphine And Related Hives   Ondansetron Hives and Rash    Other reaction(s): Unknown Other reaction(s): Unknown   Promethazine Hcl Hives   Tegaderm Ag Mesh [Silver] Rash    Old formulation only, is able to tolerate new formulation    Consultations: Palliative care  Procedures/Studies: CT CHEST ABDOMEN PELVIS W CONTRAST  Result Date: 01/23/2023 CLINICAL DATA:  Sepsis EXAM: CT CHEST, ABDOMEN, AND PELVIS WITH CONTRAST TECHNIQUE: Multidetector CT imaging of the chest, abdomen and pelvis was performed following the standard protocol during bolus administration of intravenous contrast. RADIATION DOSE REDUCTION: This exam was performed according to the departmental dose-optimization program which includes automated  exposure control, adjustment of the mA and/or kV according to patient size and/or use of iterative reconstruction technique. CONTRAST:  OMNIPAQUE IOHEXOL 300 MG/ML  SOLN COMPARISON:  CT chest 08/13/2022, CT abdomen pelvis 04/07/2022 FINDINGS: CT CHEST FINDINGS Cardiovascular: No significant coronary artery calcification. Calcification of the aortic valve leaflets. Global cardiac size within normal limits. No pericardial effusion. Central pulmonary arteries are of normal caliber. Mild atherosclerotic calcification within the thoracic aorta. No aortic aneurysm. Right internal jugular central venous catheter tip noted within the right atrium. Mediastinum/Nodes: No enlarged mediastinal, hilar, or axillary lymph nodes. Thyroid gland, trachea, and esophagus demonstrate no significant findings. Lungs/Pleura: Trace bilateral pleural effusions. 4 mm subpleural pulmonary nodule within the right lower lobe, axial image # 67/6 and 4 mm right lower lobe pulmonary nodule, axial image # 73/6 are stable since prior examination of 04/04/2020, safely considered benign. Mild parenchymal scarring noted within the right upper lobe. Lungs are otherwise  clear. No pneumothorax. No central obstructing lesion. Musculoskeletal: Bilateral mastectomy with implant reconstruction noted. No acute bone abnormality. No lytic or blastic bone lesion. CT ABDOMEN PELVIS FINDINGS Hepatobiliary: Scattered hepatic hypodensities are again identified and are stable since MRI examination of 02/01/2020 compatible multiple tiny hepatic cysts. No enhancing intrahepatic mass. No intra or extrahepatic biliary ductal dilation. Gallbladder unremarkable. Pancreas: Unremarkable Spleen: Unremarkable Adrenals/Urinary Tract: The adrenal glands are unremarkable. The kidneys are normal in size and position. 3 mm nonobstructing calculus noted within the lower pole the left kidney. No ureteral calculi. No hydronephrosis. An 11 mm indeterminate cortical hypodensity is seen within the posterior left interpolar region measuring 25 Hounsfield units in density possibly representing a hyperdense renal cyst or cystic renal neoplasm. This has enlarged since prior examination of 07/15/2020. Stomach/Bowel: The stomach, small bowel, and large bowel are unremarkable. Appendix absent. No free intraperitoneal gas or fluid. Vascular/Lymphatic: Mild aortoiliac atherosclerotic calcification. No aortic aneurysm. No pathologic adenopathy within the abdomen and pelvis. Reproductive: Status post hysterectomy. No adnexal masses. Other: Infiltrative changes within the subcutaneous fat the anterior abdominal wall and gluteal regions bilaterally is likely postsurgical in nature. No abdominal wall hernia. No abdominopelvic ascites. Musculoskeletal: No acute or significant osseous findings. IMPRESSION: 1. No acute intrathoracic or intra-abdominal pathology identified. No definite radiographic explanation for the patient's reported symptoms. 2. Calcification of the aortic valve leaflets. Echocardiography may be helpful to assess the degree of valvular dysfunction. 3. Trace bilateral pleural effusions. 4. Minimal left  nonobstructing nephrolithiasis. 5. 11 mm indeterminate cortical hypodensity within the posterior left interpolar region. This has enlarged since prior examination of 07/15/2020 and may represent a hyperdense renal cyst or cystic renal neoplasm. This could be better assessed with dedicated renal sonography once the patient's acute issues have resolved. Electronically Signed   By: Helyn Numbers M.D.   On: 01/23/2023 23:48   DG Chest 1 View  Result Date: 01/22/2023 CLINICAL DATA:  Sepsis EXAM: CHEST  1 VIEW COMPARISON:  X-ray 09/16/2022 and older FINDINGS: Calcified aorta. Normal cardiopericardial silhouette. No consolidation, pneumothorax or effusion. No edema. Surgical clips in the axillary regions. Right IJ catheter with tip along the central SVC right atrial junction region. IMPRESSION: No acute cardiopulmonary disease.  Right IJ catheter. Electronically Signed   By: Karen Kays M.D.   On: 01/22/2023 18:02   IR Fluoro Guide CV Line Right  Result Date: 01/18/2023 INDICATION: Malfunctioning tunneled central venous catheter. In need of chronic central venous catheter for frequent blood draws and medication administration. Please perform fluoroscopic guided exchange. EXAM: FLUOROSCOPIC GUIDED  PICC LINE EXCHANGE MEDICATIONS: None. CONTRAST:  None FLUOROSCOPY TIME:  1 mGy COMPLICATIONS: None immediate. TECHNIQUE: The procedure, risks, benefits, and alternatives were explained to the patient and informed written consent was obtained. The right upper chest and external portion of the pre-existing tunneled central venous catheter was prepped with chlorhexidine in a sterile fashion, and a sterile drape was applied covering the operative field. Maximum barrier sterile technique with sterile gowns and gloves were used for the procedure. A timeout was performed prior to the initiation of the procedure. Local anesthesia was provided with 1% lidocaine. The existing central venous catheter was cannulated with stiff  glidewire which was advanced through the catheter to the level of the IVC. Under intermittent fluoroscopic guidance, the pre-existing tunneled central venous catheter was exchanged for a new, slightly longer, now 19 cm tip to cuff single-lumen catheter was advanced over the stiff glidewire with tip ultimately positioned at the level of the superior cavoatrial junction. Postprocedural spot fluoroscopic image was obtained. The catheter easily aspirated and flushed and was sutured in place. A dressing was applied. The patient tolerated the procedure well without immediate post procedural complication. FINDINGS: After catheter exchange, the tip lies within the superior cavoatrial junction. The catheter aspirates and flushes normally and is ready for immediate use. IMPRESSION: Successful fluoroscopic guided exchange of right jugular approach 19 cm tip to cuff single-lumen tunneled central venous catheter with tip terminating within the superior cavoatrial junction. The central venous catheter is ready for immediate usage. Electronically Signed   By: Simonne Come M.D.   On: 01/18/2023 09:35     Subjective: No acute issues or events overnight denies nausea vomiting diarrhea constipation headache fevers chills or chest pain.  Other than ongoing fatigue and poor appetite she has no complaints.   Discharge Exam: Vitals:   01/24/23 0441 01/24/23 1351  BP: (!) 107/55 96/61  Pulse: 83 80  Resp: 17 18  Temp: 98.1 F (36.7 C) 98.2 F (36.8 C)  SpO2: 100% 100%   Vitals:   01/23/23 1204 01/23/23 2022 01/24/23 0441 01/24/23 1351  BP: 100/60 (!) 110/58 (!) 107/55 96/61  Pulse: 85 94 83 80  Resp:  20 17 18   Temp: 97.6 F (36.4 C) 98.2 F (36.8 C) 98.1 F (36.7 C) 98.2 F (36.8 C)  TempSrc:  Oral Oral Oral  SpO2: 100% 100% 100% 100%  Weight:      Height:        General: Pt is alert, awake, not in acute distress Cardiovascular: RRR, S1/S2 +, no rubs, no gallops Respiratory: CTA bilaterally, no wheezing,  no rhonchi Abdominal: Soft, NT, ND, bowel sounds + Extremities: no erythema no cyanosis    The results of significant diagnostics from this hospitalization (including imaging, microbiology, ancillary and laboratory) are listed below for reference.     Microbiology: Recent Results (from the past 240 hour(s))  SARS Coronavirus 2 by RT PCR (hospital order, performed in [facility] hospital lab) *cepheid single result test* Anterior Nasal Swab     Status: None   Collection Time: 01/22/23  8:11 PM   Specimen: Anterior Nasal Swab  Result Value Ref Range Status   SARS Coronavirus 2 by RT PCR NEGATIVE NEGATIVE Final    Comment: (NOTE) SARS-CoV-2 target nucleic acids are NOT DETECTED.  The SARS-CoV-2 RNA is generally detectable in upper and lower respiratory specimens during the acute phase of infection. The lowest concentration of SARS-CoV-2 viral copies this assay can detect is 250 copies / mL. A negative result  does not preclude SARS-CoV-2 infection and should not be used as the sole basis for treatment or other patient management decisions.  A negative result may occur with improper specimen collection / handling, submission of specimen other than nasopharyngeal swab, presence of viral mutation(s) within the areas targeted by this assay, and inadequate number of viral copies (<250 copies / mL). A negative result must be combined with clinical observations, patient history, and epidemiological information.  Fact Sheet for Patients:   RoadLapTop.co.za  Fact Sheet for Healthcare Providers: http://kim-miller.com/  This test is not yet approved or  cleared by the [place] FDA and has been authorized for detection and/or diagnosis of SARS-CoV-2 by FDA under an Emergency Use Authorization (EUA).  This EUA will remain in effect (meaning this test can be used) for the duration of the COVID-19 declaration under Section 564(b)(1) of the Act,  21 U.S.C. section 360bbb-3(b)(1), unless the authorization is terminated or revoked sooner.  Performed at [facility], [address], [place], [place] [address]      Labs: BNP (last 3 results) Recent Labs    07/01/22 1257  BNP 63.5   Basic Metabolic Panel: Recent Labs  Lab 01/22/23 1823 01/23/23 0542  NA 136 137  K 3.5 3.2*  CL 99 104  CO2 23 23  GLUCOSE 165* 116*  BUN 32* 20  CREATININE 1.80* 1.28*  CALCIUM 8.5* 8.2*  MG  --  2.1   Liver Function Tests: Recent Labs  Lab 01/22/23 1823  AST 27  ALT 46*  ALKPHOS 93  BILITOT 0.6  PROT 8.1  ALBUMIN 3.5   No results for input(s): "LIPASE", "AMYLASE" in the last 168 hours. No results for input(s): "AMMONIA" in the last 168 hours. CBC: Recent Labs  Lab 01/22/23 1823 01/23/23 0542  WBC 9.2 9.8  NEUTROABS 7.7 6.7  HGB 9.7* 8.1*  HCT 31.7* 26.8*  MCV 77.7* 78.1*  PLT 261 248   Cardiac Enzymes: No results for input(s): "CKTOTAL", "CKMB", "CKMBINDEX", "TROPONINI" in the last 168 hours. BNP: Invalid input(s): "POCBNP" CBG: No results for input(s): "GLUCAP" in the last 168 hours. D-Dimer No results for input(s): "DDIMER" in the last 72 hours. Hgb A1c No results for input(s): "HGBA1C" in the last 72 hours. Lipid Profile No results for input(s): "CHOL", "HDL", "LDLCALC", "TRIG", "CHOLHDL", "LDLDIRECT" in the last 72 hours. Thyroid function studies No results for input(s): "TSH", "T4TOTAL", "T3FREE", "THYROIDAB" in the last 72 hours.  Invalid input(s): "FREET3" Anemia work up No results for input(s): "VITAMINB12", "FOLATE", "FERRITIN", "TIBC", "IRON", "RETICCTPCT" in the last 72 hours. Urinalysis    Component Value Date/Time   COLORURINE YELLOW 01/22/2023 1739   APPEARANCEUR CLEAR 01/22/2023 1739   LABSPEC 1.005 01/22/2023 1739   PHURINE 8.0 01/22/2023 1739   GLUCOSEU NEGATIVE 01/22/2023 1739   HGBUR NEGATIVE 01/22/2023 1739   BILIRUBINUR NEGATIVE 01/22/2023 1739   KETONESUR  NEGATIVE 01/22/2023 1739   PROTEINUR NEGATIVE 01/22/2023 1739   UROBILINOGEN 1.0 06/16/2015 1045   NITRITE NEGATIVE 01/22/2023 1739   LEUKOCYTESUR MODERATE (A) 01/22/2023 1739   Sepsis Labs Recent Labs  Lab 01/22/23 1823 01/23/23 0542  WBC 9.2 9.8   Microbiology Recent Results (from the past 240 hour(s))  SARS Coronavirus 2 by RT PCR (hospital order, performed in [facility] hospital lab) *cepheid single result test* Anterior Nasal Swab     Status: None   Collection Time: 01/22/23  8:11 PM   Specimen: Anterior Nasal Swab  Result Value Ref Range Status   SARS  Coronavirus 2 by RT PCR NEGATIVE NEGATIVE Final    Comment: (NOTE) SARS-CoV-2 target nucleic acids are NOT DETECTED.  The SARS-CoV-2 RNA is generally detectable in upper and lower respiratory specimens during the acute phase of infection. The lowest concentration of SARS-CoV-2 viral copies this assay can detect is 250 copies / mL. A negative result does not preclude SARS-CoV-2 infection and should not be used as the sole basis for treatment or other patient management decisions.  A negative result may occur with improper specimen collection / handling, submission of specimen other than nasopharyngeal swab, presence of viral mutation(s) within the areas targeted by this assay, and inadequate number of viral copies (<250 copies / mL). A negative result must be combined with clinical observations, patient history, and epidemiological information.  Fact Sheet for Patients:   RoadLapTop.co.za  Fact Sheet for Healthcare Providers: http://kim-miller.com/  This test is not yet approved or  cleared by the [place] FDA and has been authorized for detection and/or diagnosis of SARS-CoV-2 by FDA under an Emergency Use Authorization (EUA).  This EUA will remain in effect (meaning this test can be used) for the duration of the COVID-19 declaration under Section 564(b)(1) of the Act,  21 U.S.C. section 360bbb-3(b)(1), unless the authorization is terminated or revoked sooner.  Performed at [facility], [address], [place], [place] [address]      Time coordinating discharge: Over 30 minutes  SIGNED:   Azucena Fallen, DO [facility] 01/24/2023, 2:31 PM Pager   If 7PM-7AM, please contact night-coverage [email or web address]

## 2023-01-24 NOTE — TOC Transition Note (Signed)
Transition of Care [facility]) - CM/SW Discharge Note   Patient Details  Name: [name] MRN: [id number] Date of Birth: 08/02/1971  Transition of Care [facility]) CM/SW Contact:  Lanier Clam, RN Phone Number: 01/24/2023, 2:34 PM   Clinical Narrative: No further CM needs.       Final next level of care: Home/Self Care Barriers to Discharge: No Barriers Identified   Patient Goals and CMS Choice CMS Medicare.gov Compare Post Acute Care list provided to:: Patient Represenative (must comment) Choice offered to / list presented to : Spouse  Discharge Placement                         Discharge Plan and Services Additional resources added to the After Visit Summary for     Discharge Planning Services: CM Consult                                 Social Determinants of Health (SDOH) Interventions SDOH Screenings   Food Insecurity: No Food Insecurity (01/23/2023)  Housing: Low Risk  (01/23/2023)  Transportation Needs: No Transportation Needs (01/23/2023)  Utilities: Not At Risk (01/23/2023)  Depression (PHQ2-9): Low Risk  (06/14/2022)  Tobacco Use: Low Risk  (01/22/2023)     Readmission Risk Interventions    09/14/2022   12:19 PM 07/02/2022    1:15 PM 07/02/2022   11:32 AM  Readmission Risk Prevention Plan  Transportation Screening Complete Complete Complete  PCP or Specialist Appt within 5-7 Days  Complete Complete  Home Care Screening  Complete Complete  Medication Review (RN CM)  Complete Complete  Medication Review (RN Care Manager) Complete    PCP or Specialist appointment within 3-5 days of discharge Complete    HRI or Home Care Consult Complete    SW Recovery Care/Counseling Consult Complete    Palliative Care Screening Not Applicable    Skilled Nursing Facility Not Applicable

## 2023-01-25 ENCOUNTER — Ambulatory Visit (INDEPENDENT_AMBULATORY_CARE_PROVIDER_SITE_OTHER): Payer: Self-pay | Admitting: Psychiatry

## 2023-01-25 DIAGNOSIS — F331 Major depressive disorder, recurrent, moderate: Secondary | ICD-10-CM

## 2023-01-27 ENCOUNTER — Other Ambulatory Visit: Payer: Self-pay | Admitting: Adult Health

## 2023-01-27 DIAGNOSIS — F331 Major depressive disorder, recurrent, moderate: Secondary | ICD-10-CM

## 2023-01-27 DIAGNOSIS — R911 Solitary pulmonary nodule: Secondary | ICD-10-CM | POA: Diagnosis not present

## 2023-01-28 ENCOUNTER — Ambulatory Visit (INDEPENDENT_AMBULATORY_CARE_PROVIDER_SITE_OTHER): Payer: Self-pay | Admitting: Psychiatry

## 2023-01-28 ENCOUNTER — Ambulatory Visit: Payer: BC Managed Care – PPO | Admitting: Psychiatry

## 2023-01-28 DIAGNOSIS — J9611 Chronic respiratory failure with hypoxia: Secondary | ICD-10-CM | POA: Diagnosis not present

## 2023-01-28 DIAGNOSIS — F331 Major depressive disorder, recurrent, moderate: Secondary | ICD-10-CM

## 2023-01-28 NOTE — Progress Notes (Signed)
Patient no showed for this appt

## 2023-01-28 NOTE — Progress Notes (Deleted)
Patient no showed for this appt

## 2023-01-28 NOTE — Progress Notes (Signed)
[facility] Counselor/Therapist Progress Note  Patient ID: [name], MRN: [id number],    Date: 01/28/2023  Time Spent: 48 minutes  Virtual Visit via Telehealth Note:MyChartVideo Connected with patient by a telemedicine/telehealth application, with their informed consent, and verified patient privacy and that I am speaking with the correct person using two identifiers. I discussed the limitations, risks, security and privacy concerns of performing psychotherapy and the availability of in person appointments. I also discussed with the patient that there may be a patient responsible charge related to this service. The patient expressed understanding and agreed to proceed. I discussed the treatment planning with the patient. The patient was provided an opportunity to ask questions and all were answered. The patient agreed with the plan and demonstrated an understanding of the instructions. The patient was advised to call  our office if  symptoms worsen or feel they are in a crisis state and need immediate contact.   Therapist Location: office Patient Location: home   Treatment Type: Individual Therapy  Reported Symptoms: depression, anxiety, despair  Mental Status Exam:  Appearance:   Casual     Behavior:  Appropriate, Sharing, and Motivated  Motor:  Had session from her bed , remains on palliative care  Speech/Language:   Clear and Coherent  Affect:  Depressed and anxious  Mood:  anxious and depressed  Thought process:  goal directed  Thought content:    WNL  Sensory/Perceptual disturbances:    WNL  Orientation:  oriented to person, place, time/date, situation, day of week, month of year, year, and stated date of January 28, 2023  Attention:  Good  Concentration:  Good and Fair  Memory:  She states it's some better, "ok"  Fund of knowledge:   Good  Insight:    Good and Fair  Judgment:   Good  Impulse Control:  Good   Risk Assessment: Danger to Self:  No Self-injurious  Behavior: No Danger to Others: No Duty to Warn:no Physical Aggression / Violence:No  Access to Firearms a concern: No  Gang Involvement:No   Subjective:   Patient participating in session via MyChart video and reports anxiety, depression, "indifference" and "aloneness. Worked on these feelings today as she feels it's hard for people to understand how she feels what she feels. Processed this more with patient today as she has shared how misunderstood she has felt by others close to her, which is very hurtful for patient and she shares this more fully and with lots of emotion today in session. Lots of questions of herself and "God". Feeling hurt by family.  (Not all details included in this note due to patient privacy needs.)  Supported patient more today in her venting some of her hurts and talking through them in order to eventually feel heard, validated, and move in a forward direction.  Some anger also shared and talked through today. Did share "on a positive note" that her dad is coming up to visit her after our session today" and is looking forward to his visit.  Encouraged patient to continue using some meditation as she has on other occasions with benefit.  Was smiling some towards end of session and seemed to really appreciate being able to talk today.  Interventions: Cognitive Behavioral Therapy and Ego-Supportive  Long-term goal:  Patient will confront her anxiety, fears, anger reported re: her illness and use time in therapy and beyond to work on this, while receiving feedback and support from therapist. Help her determine what  she feels is realistic for her considering her health challenges and difficult prognosis.  Short-term goal: Participate in therapy and initiate communication of needs and desires at this point in her struggle physically.  Strategies: Share and talk through anxiety, anger, and depression that are experienced and work to find and use helpful coping tools to better  manage those feelings.  Diagnosis:   ICD-10-CM   1. Major depressive disorder, recurrent episode, moderate (HCC)  F33.1      Plan:   Patient today showing good anticipation and worked today on some of her most sensitive hurts including some by family.  Finds herself often feeling alone as noted above.  Denies any thoughts of self-harm and is good about reaching out to those that she feels are more supportive.  Is making progress and needs to continue working with goal-directed behaviors to move in a forward direction.  Patient reports not having a lot of healthy support and seems to appreciate our sessions which gives her a chance to vent a lot of her concerns, freely talk about her fears and anxieties.  My contact with patient continues to be 1 of helping her manage her thoughts and emotions and supportive ways and ways that help her feel more hopeful and cared about in the midst of serious diagnosis and questionable future.  Review and progress/challenges noted with patient.  Next appointment within approximately 3 weeks.   Mathis Fare, LCSW

## 2023-01-31 ENCOUNTER — Other Ambulatory Visit (HOSPITAL_COMMUNITY): Payer: Self-pay

## 2023-01-31 ENCOUNTER — Telehealth: Payer: Self-pay

## 2023-01-31 MED ORDER — NORMAL SALINE FLUSH 0.9 % IV SOLN
INTRAVENOUS | 3 refills | Status: DC
  Filled 2023-01-31: qty 600, 30d supply, fill #0
  Filled 2023-02-09 – 2023-02-12 (×2): qty 600, 30d supply, fill #1
  Filled 2023-03-05 – 2023-04-16 (×2): qty 600, 30d supply, fill #2
  Filled 2023-05-27: qty 600, 30d supply, fill #3

## 2023-01-31 NOTE — Telephone Encounter (Signed)
PC SW LVM for patients spouse. Returning his call inquiring about scheduling a PC visit.

## 2023-02-01 ENCOUNTER — Other Ambulatory Visit (HOSPITAL_COMMUNITY): Payer: Self-pay

## 2023-02-04 ENCOUNTER — Other Ambulatory Visit (HOSPITAL_COMMUNITY): Payer: Self-pay

## 2023-02-06 ENCOUNTER — Other Ambulatory Visit: Payer: Self-pay | Admitting: Adult Health

## 2023-02-07 ENCOUNTER — Telehealth: Payer: Self-pay | Admitting: Adult Health

## 2023-02-07 ENCOUNTER — Other Ambulatory Visit: Payer: Self-pay | Admitting: Adult Health

## 2023-02-07 DIAGNOSIS — F411 Generalized anxiety disorder: Secondary | ICD-10-CM

## 2023-02-07 MED ORDER — CLONAZEPAM 0.5 MG PO TABS
0.5000 mg | ORAL_TABLET | Freq: Three times a day (TID) | ORAL | 2 refills | Status: DC | PRN
Start: 2023-02-07 — End: 2023-02-14

## 2023-02-07 NOTE — Telephone Encounter (Signed)
Script sent  

## 2023-02-07 NOTE — Telephone Encounter (Signed)
Pt called at 1:58p.  She would like refill of Clonazepam sent to   [id number] [place], [place] - [address] [place] [address] Phone: [phone]  Fax: [phone]   Next appt 5/16

## 2023-02-09 ENCOUNTER — Other Ambulatory Visit (HOSPITAL_COMMUNITY): Payer: Self-pay

## 2023-02-11 ENCOUNTER — Telehealth: Payer: Self-pay | Admitting: Adult Health

## 2023-02-11 ENCOUNTER — Other Ambulatory Visit: Payer: Self-pay

## 2023-02-11 MED ORDER — LORAZEPAM 0.5 MG PO TABS: 0.5000 mg | ORAL_TABLET | Freq: Three times a day (TID) | ORAL | 0 refills | Status: DC | PRN

## 2023-02-11 NOTE — Telephone Encounter (Signed)
Clonazepam last picked up 4/17, due 5/15.

## 2023-02-11 NOTE — Telephone Encounter (Signed)
Patient reporting nonstop PA since Friday. She is having a lot of family issues and health issues and feels like she is ruining everyone's lives. Husband is with her. She would not say she was/wasn't having SI, said she just wanted to get in her car and drive away. Recommended she go to [facility] since she has some significant health issues, but also told her about [facility]. She sees Deb 5/15 and you 5/16. Relieved when told about the lorazepam Rx to cover until she could fill clonazepam.

## 2023-02-11 NOTE — Telephone Encounter (Signed)
Next appt is 02/14/23. [name] called in tears and couldn't stop crying. She said that she has not had her Lorazepam in a month and can't get her Clonazepam until tomorrow on the 14th. She said she wants to get in her car and go away. She didn't not say she had suicidal ideation but did say the above comment. States her skin is pulling off, has trouble choking. She informed that Hospice does not want her RX's filled. Please call her at  [phone].

## 2023-02-11 NOTE — Telephone Encounter (Signed)
Noted. Ty!

## 2023-02-12 ENCOUNTER — Other Ambulatory Visit (HOSPITAL_COMMUNITY): Payer: Self-pay

## 2023-02-12 ENCOUNTER — Encounter: Payer: Self-pay | Admitting: Hematology and Oncology

## 2023-02-12 ENCOUNTER — Telehealth: Payer: Self-pay

## 2023-02-12 NOTE — Telephone Encounter (Signed)
1149 Palliative Care Note  RN returned call to pt. Patient is very tearful on the phone. Unable to talk in complete sentences. Pt voiced frustrations with being discharged from hospice and "now my meds are all messed up." Has appt with psych NP in two days. RN encouraged pt to talk with NP to get meds straightened out. Pt voiced fears of becoming septic again. RN provided active listening and gave time to voice fears and concerns.  Pt states, "I have 4 bottles of dilaudid and I dont take pain meds. What do I do with them?" Pt continues to make comments like, " I have fought cancer since I was 52 years old and now I am dying from the effects of all the treatments." Pt says, "I just can't keep living like this. I can't keep putting my family thru this." Pt denies suicidal ideation. Pt states, "oh I wouldn't hurt myself, I would just get in my car and drive away from here so they could live a better life."   RN recommended pt call psychiatrist to see if she could get a virtual visit sooner than Thursday. Pt voiced that she would. RN ensured pt had contact info.  Barbette Merino, RN

## 2023-02-13 ENCOUNTER — Ambulatory Visit (INDEPENDENT_AMBULATORY_CARE_PROVIDER_SITE_OTHER): Payer: BC Managed Care – PPO | Admitting: Psychiatry

## 2023-02-13 ENCOUNTER — Other Ambulatory Visit (HOSPITAL_COMMUNITY): Payer: Self-pay

## 2023-02-13 DIAGNOSIS — F331 Major depressive disorder, recurrent, moderate: Secondary | ICD-10-CM

## 2023-02-13 NOTE — Progress Notes (Signed)
[facility] Counselor/Therapist Progress Note  Patient ID: [name], MRN: [id number],    Date: 02/13/2023  Time Spent: 48 minutes  Treatment Type: Individual Therapy  Virtual Visit via Telehealth Note: MyChart Video Connected with patient by a telemedicine/telehealth application, with their informed consent, and verified patient privacy and that I am speaking with the correct person using two identifiers. I discussed the limitations, risks, security and privacy concerns of performing psychotherapy and the availability of in person appointments. I also discussed with the patient that there may be a patient responsible charge related to this service. The patient expressed understanding and agreed to proceed. I discussed the treatment planning with the patient. The patient was provided an opportunity to ask questions and all were answered. The patient agreed with the plan and demonstrated an understanding of the instructions. The patient was advised to call  our office if  symptoms worsen or feel they are in a crisis state and need immediate contact.   Therapist Location: office Patient Location: home   Reported Symptoms: anxiety, depression, reportedly having some issue with her meds and is in touch with prescribers  Mental Status Exam:  Appearance:   Casual     Behavior:  Appropriate, Sharing, and Motivated  Motor:  Limited due to illness  Speech/Language:   Clear and Coherent  Affect:  Depressed and anxious  Mood:  anxious and depressed  Thought process:  goal directed  Thought content:    WNL  Sensory/Perceptual disturbances:    WNL  Orientation:  oriented to person, place, time/date, situation, day of week, month of year, year, and stated date of Feb 13, 2023  Attention:  Fair  Concentration:  Good and Fair  Memory:  WNL  Fund of knowledge:   Good  Insight:    Good and Fair  Judgment:   Good  Impulse Control:  Good   Risk Assessment: Danger to Self:   No Self-injurious Behavior: No Danger to Others: No Duty to Warn:no Physical Aggression / Violence:No  Access to Firearms a concern: No  Gang Involvement:No   Subjective: Patient today needing session to vent a lot of anxious/depressive thoughts and feelings about her cancer and outlook. Hurt by others in some cases and not understanding patient. Shared some frustrating/hurtful situations/comments. Does have support from others who are more helpful. Still receiving services from palliative care after her transfer back to palliative from hospice care. Encouraged patient's open expression of her fears, sadness, anxieties, and frustrations in session today and she responded well, and feeling less alone. Feeling more understood as we talked today, as often she reports feeling not understood by family/friends and others at times. Visit with dad since last session went well. Encouraged patient in continuing to use meditation, support of others who want to be helpful, deep breathing exercises to help with her anxiety and stress which she reports is episodic.  Interventions: Cognitive Behavioral Therapy and Ego-Supportive  Long-term goal:  Patient will confront her anxiety, fears, anger reported re: her illness and use time in therapy and beyond to work on this, while receiving feedback and support from therapist. Help her determine what she feels is realistic for her considering her health challenges and difficult prognosis.  Short-term goal: Participate in therapy and initiate communication of needs and desires at this point in her struggle physically.  Strategies: Share and talk through anxiety, anger, and depression that are experienced and work to find and use helpful coping tools to better manage those  feelings.  Diagnosis:   ICD-10-CM   1. Major depressive disorder, recurrent episode, moderate (HCC)  F33.1      Plan: Patient today working more on her anxiety and  her depression related mostly  to her cancer, family relationships, and not feeling heard/understood sometimes by family and friends. Therapy with this patient remains focused on helping patient manage her thoughts and  emotions in helpful/supportive ways to support patient feeling more hopeful and cared about in midst having cancer and uncertain future. Is making progress as she is able, and needs to continue working with goal-directed behaviors to move in a forward direction.   Goal review and progress/challenges noted with patient, and understandably challenges have increased recently.  Next appt within 2-3 weeks.   Mathis Fare, LCSW

## 2023-02-14 ENCOUNTER — Telehealth (INDEPENDENT_AMBULATORY_CARE_PROVIDER_SITE_OTHER): Payer: BC Managed Care – PPO | Admitting: Adult Health

## 2023-02-14 ENCOUNTER — Encounter: Payer: Self-pay | Admitting: Adult Health

## 2023-02-14 DIAGNOSIS — F410 Panic disorder [episodic paroxysmal anxiety] without agoraphobia: Secondary | ICD-10-CM | POA: Diagnosis not present

## 2023-02-14 DIAGNOSIS — G4700 Insomnia, unspecified: Secondary | ICD-10-CM

## 2023-02-14 DIAGNOSIS — F411 Generalized anxiety disorder: Secondary | ICD-10-CM

## 2023-02-14 DIAGNOSIS — F331 Major depressive disorder, recurrent, moderate: Secondary | ICD-10-CM

## 2023-02-14 MED ORDER — CLONAZEPAM 0.5 MG PO TABS
ORAL_TABLET | ORAL | 2 refills | Status: DC
Start: 2023-02-14 — End: 2023-03-04

## 2023-02-14 NOTE — Progress Notes (Signed)
[name] [id number] 05-24-1971 52 y.o.  Virtual Visit via Video Note  I connected with pt @ on 02/14/23 at  5:00 PM EDT by a video enabled telemedicine application and verified that I am speaking with the correct person using two identifiers.   I discussed the limitations of evaluation and management by telemedicine and the availability of in person appointments. The patient expressed understanding and agreed to proceed.  I discussed the assessment and treatment plan with the patient. The patient was provided an opportunity to ask questions and all were answered. The patient agreed with the plan and demonstrated an understanding of the instructions.   The patient was advised to call back or seek an in-person evaluation if the symptoms worsen or if the condition fails to improve as anticipated.  I provided 25 minutes of non-face-to-face time during this encounter. The patient was located at home.  The provider was located at [facility].   Susan Gibbs, NP   Subjective:   Patient ID:  [name] is a 52 y.o. (DOB Mar 20, 1971) female.  Chief Complaint: No chief complaint on file.   HPI [name] presents for follow-up of MDD, GAD, insomnia, and panic attacks.  Hospice care.  Describes mood today as "not good". Pleasant. Tearful throughout interview. Mood symptoms - reports depression and irritability. Reports increased anxiety. Reports increased panic attacks. Reports worry, rumination, and over thinking. Mood is low. Stating "I haven't been doing good". Uncertain if medications are helping her. Switched back from hospice care to palliative care. Family supportive. Decreased interest and motivation. Taking medications as prescribed. Working with [name] for therapy.  Energy levels poor. Active, does not have a regular exercise routine with current physical disabilities. Unable to enjoy usual interests and activities. Married. Lives with husband and s-16.  Has a daughter - 60 in [place]. Spending time with family.  Appetite decreased. Weight loss - 178 pounds. Sleeps better some nights than others. Averages 2 to 3 hours in the morning and 2 to 3 hours in the evening. Focus and concentration difficulties - having issues with memory. Completing tasks. Managing aspects of household. Disabled since 2016. Denies SI or HI.  Denies AH or VH. Denies self harm. Denies substance use.   Previous medication trials: Wellbutrin - mean, Zoloft-not helpful   Review of Systems:  Review of Systems  Musculoskeletal:  Negative for gait problem.  Neurological:  Negative for tremors.  Psychiatric/Behavioral:         Please refer to HPI    Medications: I have reviewed the patient's current medications.  Current Outpatient Medications  Medication Sig Dispense Refill   albuterol (PROVENTIL) (2.5 MG/3ML) 0.083% nebulizer solution Take 3 mLs (2.5 mg total) by nebulization every 6 (six) hours as needed for wheezing or shortness of breath. 360 mL 2   aspirin EC 81 MG tablet Take 1 tablet (81 mg total) by mouth daily. Swallow whole. (Patient taking differently: Take 81 mg by mouth at bedtime. Chewable) 90 tablet 3   Bismuth Tribromoph-Petrolatum (XEROFORM OCCLUSIVE GAUZE STRIP) PADS Apply 1 each topically as directed. 50 each 1   bumetanide (BUMEX) 1 MG tablet TAKE 1 TABLET BY MOUTH TWICE A DAY (Patient taking differently: Take 1 mg by mouth 2 (two) times daily.) 180 tablet 3   buPROPion (WELLBUTRIN XL) 150 MG 24 hr tablet TAKE 1 TABLET BY MOUTH EVERY DAY 90 tablet 0   clonazePAM (KLONOPIN) 0.5 MG tablet Take one tablet four times daily. 120 tablet  2   dexlansoprazole (DEXILANT) 60 MG capsule Take 60 mg by mouth daily.     Diclofenac Potassium,Migraine, (CAMBIA) 50 MG PACK Take 50 mg by mouth daily as needed for migraine.     diphenhydrAMINE (BENADRYL) 12.5 MG/5ML elixir Take 10 mLs (25 mg total) by mouth every 6 (six) hours as needed (nausea). 120 mL 0    dronabinol (MARINOL) 2.5 MG capsule TAKE 1 CAPSULE BY MOUTH TWICE A DAY AS NEEDED NAUSEA (Patient taking differently: Take 2.5 mg by mouth at bedtime.) 60 capsule 0   EMGALITY 120 MG/ML SOSY Inject 120 mg into the skin every 28 (twenty-eight) days.     EPINEPHRINE 0.3 mg/0.3 mL IJ SOAJ injection INJECT 0.3 MG INTO THE MUSCLE AS NEEDED FOR ANAPHYLAXIS. 2 each 1   escitalopram (LEXAPRO) 20 MG tablet TAKE 1 TABLET BY MOUTH EVERY DAY IN THE EVENING 90 tablet 1   FLORASTOR 250 MG capsule Take 250 mg by mouth See admin instructions. Take 250 mg by mouth mid-morning and mid-afternoon (1600)     fluticasone-salmeterol (ADVAIR HFA) 230-21 MCG/ACT inhaler INHALE 2 PUFFS INTO THE LUNGS TWICE A DAY (Patient taking differently: Inhale 2 puffs into the lungs 2 (two) times daily.) 12 each 5   guaiFENesin (ROBITUSSIN) 100 MG/5ML liquid Take 15 mLs by mouth every 6 (six) hours as needed for cough or to loosen phlegm. 120 mL 0   hydrocortisone (CORTEF) 10 MG tablet Take 1-2 tablets (10-20 mg total) by mouth See admin instructions. Take 20 mg in the am and 10mg  in the evening. Take after completing prednisone     HYDROmorphone (DILAUDID) 4 MG tablet Take 1 tablet (4 mg total) by mouth every 4 (four) hours as needed. 90 tablet 0   HYDROmorphone (DILAUDID) 4 MG tablet Take 1 tablet (4 mg total) by mouth every 4 (four) hours, as needed 90 tablet 0   hydrOXYzine (ATARAX) 25 MG tablet Take 1 tablet (25 mg total) by mouth every 6 (six) hours as needed for anxiety. 30 tablet 0   lamoTRIgine (LAMICTAL) 200 MG tablet TAKE 1 TABLET BY MOUTH EVERYDAY AT BEDTIME (Patient taking differently: Take 200 mg by mouth at bedtime.) 30 tablet 5   Lancets (ONETOUCH DELICA PLUS LANCET33G) MISC 3 (three) times daily. for testing     LORazepam (ATIVAN) 0.5 MG tablet Take 1 tablet (0.5 mg total) by mouth 3 (three) times daily as needed for anxiety. 12 tablet 0   menthol-cetylpyridinium (CEPACOL) 3 MG lozenge Take 1 lozenge (3 mg total) by mouth  as needed for sore throat. 100 tablet 0   nitroGLYCERIN (NITROSTAT) 0.4 MG SL tablet Dissolve 1 tablet under the tongue every 5 minutes as needed for chest pain. Max of 3 doses, then 911. 25 tablet 6   ONETOUCH VERIO test strip 3 (three) times daily. for testing     OXYGEN Inhale 4-5 L/min into the lungs continuous.     PROAIR HFA 108 (90 Base) MCG/ACT inhaler Inhale 2 puffs into the lungs every 4 (four) hours as needed for wheezing or shortness of breath.     QVAR REDIHALER 80 MCG/ACT inhaler INHALE 1 PUFF INTO THE LUNGS TWICE A DAY (Patient taking differently: Inhale 1 puff into the lungs 2 (two) times daily.) 31.8 g 2   Rimegepant Sulfate (NURTEC) 75 MG TBDP Take 75 mg by mouth daily as needed (Migraine).      rosuvastatin (CRESTOR) 10 MG tablet Take 10 mg by mouth daily. Mid morning     sacubitril-valsartan (ENTRESTO)  24-26 MG Take 1 tablet by mouth 2 (two) times daily. 60 tablet 11   silver sulfADIAZINE (SILVADENE) 1 % cream Apply to affected area daily 50 g 1   sitaGLIPtin (JANUVIA) 100 MG tablet Take 1 tablet (100 mg total) by mouth daily.     Sodium Chloride Flush (NORMAL SALINE FLUSH) 0.9 % SOLN Use as directed for central venous catheter maintenance 600 mL 3   sucralfate (CARAFATE) 1 GM/10ML suspension Take 1 g by mouth daily as needed (as directed for ulcers).     SYNTHROID 100 MCG tablet Take 1 tablet (100 mcg total) by mouth every morning. (Patient taking differently: Take 100 mcg by mouth every morning. * Must be name brand*)     Tiotropium Bromide Monohydrate (SPIRIVA RESPIMAT) 1.25 MCG/ACT AERS Inhale 2 puffs into the lungs daily. 4 g 5   topiramate (TOPAMAX) 50 MG tablet Take 150 mg by mouth at bedtime.      zolpidem (AMBIEN CR) 12.5 MG CR tablet Take 12.5 mg by mouth at bedtime.     No current facility-administered medications for this visit.    Medication Side Effects: None  Allergies:  Allergies  Allergen Reactions   Ferrous Bisglycinate Chelate [Iron] Anaphylaxis and  Other (See Comments)    Only IV - only FERRLICET   Mushroom Extract Complex Anaphylaxis   Na Ferric Gluc Cplx In Sucrose Anaphylaxis   Cymbalta [Duloxetine Hcl] Swelling and Anxiety   Hydromorphone Other (See Comments)    BP drop and heart rate drops 5.1.20 PT REPORTS THAT SHE TAKES DILAUDID AT HOME   Ondansetron Hcl Other (See Comments)   Promethazine Other (See Comments)    Other reaction(s): Unknown   Succinylcholine Other (See Comments)    Lock Jaw   Buprenorphine Hcl Hives   Compazine Other (See Comments)    Altered mental status Aggression   Duloxetine     Other reaction(s): Not available   Metoclopramide Other (See Comments)    Dystonia   Morphine And Codeine Hives   Ondansetron Hives and Rash    Other reaction(s): Unknown Other reaction(s): Unknown   Promethazine Hcl Hives   Tegaderm Ag Mesh [Silver] Rash    Old formulation only, is able to tolerate new formulation    Past Medical History:  Diagnosis Date   Addison's disease (HCC)    Adrenal insufficiency (HCC)    Anemia    Anxiety    Aortic stenosis    Aortic stenosis    Appendicitis 12/19/09   Appendicitis    Breast cancer (HCC)    STATUS POST BILATERAL MASTECTOMY. STATUS POST RECONSTRUCTION. SHE HAD SILICONE BREAST IMPLANTS AND THE LEFT IMPLANT IS LEAKING SLIGHTLY   Cellulitis of right middle finger 11/07/2018   Cervical cancer (HCC) 12/23/2018   Chest pain    CHF with right heart failure (HCC) 04/17/2017   Chronic respiratory failure with hypoxia (HCC) 12/23/2018   Cough variant asthma 04/13/2019   Depression    GERD (gastroesophageal reflux disease)    takes Dexilant and carafate and gi coctail    Headache    migraines on a daily and monthly regimen    Heart murmur    History of kidney stones    Hodgkin lymphoma (HCC)    STATUS POST MANTLE RADIATION   Hodgkin's lymphoma (HCC)    1987   Hypertension    Hypoxia    Necrotizing fasciitis (HCC) 12/23/2018   Non-ischemic cardiomyopathy (HCC)     Osteoporosis    Palpitations    Pituitary  adenoma (HCC) 12/23/2018   Pneumonia    PONV (postoperative nausea and vomiting)    Pre-diabetes    per pt; no meds   Pulmonary hypertension (HCC) 12/23/2018   Raynaud phenomenon    Right heart failure (HCC) 04/17/2017   Seizures (HCC)    last febrile seizure was approx 3 weeks ago per report on 12/01/2020   Sleep apnea    upcoming sleep study per pt    Supplemental oxygen dependent    3 liters   SVT (supraventricular tachycardia)    Tachycardia    Thyroid cancer (HCC)    STATUS POST SURGICAL REMOVAL-CURRENT ON THYROID REPLACEMENT    Family History  Family history unknown: Yes    Social History   Socioeconomic History   Marital status: Married    Spouse name: Not on file   Number of children: Not on file   Years of education: Not on file   Highest education level: Not on file  Occupational History   Not on file  Tobacco Use   Smoking status: Never   Smokeless tobacco: Never  Vaping Use   Vaping Use: Never used  Substance and Sexual Activity   Alcohol use: Not Currently    Comment: social    Drug use: No   Sexual activity: Yes    Birth control/protection: Surgical  Other Topics Concern   Not on file  Social History Narrative   Not on file   Social Determinants of Health   Financial Resource Strain: Not on file  Food Insecurity: No Food Insecurity (01/23/2023)   Hunger Vital Sign    Worried About Running Out of Food in the Last Year: Never true    Ran Out of Food in the Last Year: Never true  Transportation Needs: No Transportation Needs (01/23/2023)   PRAPARE - Administrator, Civil Service (Medical): No    Lack of Transportation (Non-Medical): No  Physical Activity: Not on file  Stress: Not on file  Social Connections: Not on file  Intimate Partner Violence: Not At Risk (01/23/2023)   Humiliation, Afraid, Rape, and Kick questionnaire    Fear of Current or Ex-Partner: No    Emotionally Abused: No     Physically Abused: No    Sexually Abused: No    Past Medical History, Surgical history, Social history, and Family history were reviewed and updated as appropriate.   Please see review of systems for further details on the patient's review from today.   Objective:   Physical Exam:  LMP  (LMP Unknown)   Physical Exam Constitutional:      General: She is not in acute distress. Musculoskeletal:        General: No deformity.  Neurological:     Mental Status: She is alert and oriented to person, place, and time.     Coordination: Coordination normal.  Psychiatric:        Attention and Perception: Attention and perception normal. She does not perceive auditory or visual hallucinations.        Mood and Affect: Mood normal. Mood is not anxious or depressed. Affect is not labile, blunt, angry or inappropriate.        Speech: Speech normal.        Behavior: Behavior normal.        Thought Content: Thought content normal. Thought content is not paranoid or delusional. Thought content does not include homicidal or suicidal ideation. Thought content does not include homicidal or suicidal plan.  Cognition and Memory: Cognition and memory normal.        Judgment: Judgment normal.     Comments: Insight intact     Lab Review:     Component Value Date/Time   NA 137 01/23/2023 0542   NA 140 08/20/2022 1513   K 3.2 (L) 01/23/2023 0542   CL 104 01/23/2023 0542   CO2 23 01/23/2023 0542   GLUCOSE 116 (H) 01/23/2023 0542   BUN 20 01/23/2023 0542   BUN 28 (H) 08/20/2022 1513   CREATININE 1.28 (H) 01/23/2023 0542   CREATININE 1.08 (H) 02/09/2022 1202   CALCIUM 8.2 (L) 01/23/2023 0542   PROT 8.1 01/22/2023 1823   PROT 7.1 01/23/2018 1225   ALBUMIN 3.5 01/22/2023 1823   ALBUMIN 4.4 01/23/2018 1225   AST 27 01/22/2023 1823   AST 26 02/09/2022 1202   ALT 46 (H) 01/22/2023 1823   ALT 40 02/09/2022 1202   ALKPHOS 93 01/22/2023 1823   BILITOT 0.6 01/22/2023 1823   BILITOT 0.4 02/09/2022  1202   GFRNONAA 51 (L) 01/23/2023 0542   GFRNONAA >60 02/09/2022 1202   GFRAA >60 04/27/2020 0613   GFRAA >60 02/10/2020 1020       Component Value Date/Time   WBC 9.8 01/23/2023 0542   RBC 3.43 (L) 01/23/2023 0542   HGB 8.1 (L) 01/23/2023 0542   HGB 10.6 (L) 08/20/2022 1513   HCT 26.8 (L) 01/23/2023 0542   HCT 34.4 08/20/2022 1513   PLT 248 01/23/2023 0542   PLT 414 08/20/2022 1513   MCV 78.1 (L) 01/23/2023 0542   MCV 78 (L) 08/20/2022 1513   MCH 23.6 (L) 01/23/2023 0542   MCHC 30.2 01/23/2023 0542   RDW 18.5 (H) 01/23/2023 0542   RDW 16.5 (H) 08/20/2022 1513   LYMPHSABS 1.9 01/23/2023 0542   MONOABS 1.1 (H) 01/23/2023 0542   EOSABS 0.0 01/23/2023 0542   BASOSABS 0.0 01/23/2023 0542    No results found for: "POCLITH", "LITHIUM"   No results found for: "PHENYTOIN", "PHENOBARB", "VALPROATE", "CBMZ"   .res Assessment: Plan:    Plan:  PDMP reviewed  Lexapro 20mg  daily Clonazepam 0.5mg  TID to 4 times daily. Wellbutrin XL 150mg  every morning - denies seizure history Lamictal 200mg  daily  Seeing [name] therapy visits.  Discussed potential benefits, risk, and side effects of benzodiazepines to include potential risk of tolerance and dependence, as well as possible drowsiness.  Advised patient not to drive if experiencing drowsiness and to take lowest possible effective dose to minimize risk of dependence and tolerance.   Counseled patient regarding potential benefits, risks, and side effects of Lamictal to include potential risk of Stevens-Johnson syndrome. Advised patient to stop taking Lamictal and contact office immediately if rash develops and to seek urgent medical attention if rash is severe and/or spreading quickly.  RTC 2 weeks  Patient advised to contact office with any questions, adverse effects, or acute worsening in signs and symptoms.  Diagnoses and all orders for this visit:  Major depressive disorder, recurrent episode, moderate  (HCC)  Generalized anxiety disorder -     clonazePAM (KLONOPIN) 0.5 MG tablet; Take one tablet four times daily.  Panic attacks  Insomnia, unspecified type     Please see After Visit Summary for patient specific instructions.  Future Appointments  Date Time Provider Department Center  02/19/2023  1:00 PM Mathis Fare, LCSW [facility] None  03/18/2023 11:00 AM Mathis Fare, LCSW [facility] None    No orders of the defined types were placed in  this encounter.     -------------------------------

## 2023-02-15 ENCOUNTER — Telehealth: Payer: Self-pay | Admitting: Adult Health

## 2023-02-15 ENCOUNTER — Other Ambulatory Visit: Payer: Self-pay

## 2023-02-15 MED ORDER — CLONAZEPAM 1 MG PO TABS: 1.0000 mg | ORAL_TABLET | Freq: Two times a day (BID) | ORAL | 1 refills | Status: DC

## 2023-02-15 NOTE — Telephone Encounter (Signed)
We can change to 1mg  BID.

## 2023-02-15 NOTE — Telephone Encounter (Signed)
[facility] sent notice that insurance will not cover Clonazepam 4 times daily.  Need PA and alternate dose or med.

## 2023-02-15 NOTE — Telephone Encounter (Signed)
New Rx pended for 1 mg BID

## 2023-02-15 NOTE — Telephone Encounter (Signed)
Please see message from Marilyn.  

## 2023-02-19 ENCOUNTER — Ambulatory Visit (INDEPENDENT_AMBULATORY_CARE_PROVIDER_SITE_OTHER): Payer: BC Managed Care – PPO | Admitting: Psychiatry

## 2023-02-19 DIAGNOSIS — F331 Major depressive disorder, recurrent, moderate: Secondary | ICD-10-CM | POA: Diagnosis not present

## 2023-02-19 NOTE — Progress Notes (Addendum)
[facility] Counselor/Therapist Progress Note  Patient ID: [name], MRN: [id number],    Date: 02/19/2023  Time Spent: 48 minutes   Treatment Type: Individual Therapy  Virtual Visit via Telehealth Note:  MyChart Video Connected with patient by a telemedicine/telehealth application, with their informed consent, and verified patient privacy and that I am speaking with the correct person using two identifiers. I discussed the limitations, risks, security and privacy concerns of performing psychotherapy and the availability of in person appointments. I also discussed with the patient that there may be a patient responsible charge related to this service. The patient expressed understanding and agreed to proceed. I discussed the treatment planning with the patient. The patient was provided an opportunity to ask questions and all were answered. The patient agreed with the plan and demonstrated an understanding of the instructions. The patient was advised to call  our office if  symptoms worsen or feel they are in a crisis state and need immediate contact.   Therapist Location: office Patient Location: home  Reported Symptoms: depression, anxiety, "baffled"   Mental Status Exam:  Appearance:   Casual     Behavior:  Appropriate, Sharing, and Motivated  Motor:  Normal  Speech/Language:   Clear and Coherent  Affect:  Depressed and some tearfulness  Mood:  depressed  Thought process:  goal directed  Thought content:    Rumination  Sensory/Perceptual disturbances:    WNL  Orientation:  oriented to person, place, time/date, situation, day of week, month of year, year, and stated date of Feb 19, 2023  Attention:  Good  Concentration:  Good  Memory:  Some short term issues and Dr is aware  Fund of knowledge:   Good  Insight:    Good  Judgment:   Good  Impulse Control:  Good   Risk Assessment: Danger to Self:  No Self-injurious Behavior: No Danger to Others: No Duty to  Warn:no Physical Aggression / Violence:No  Access to Firearms a concern: No  Gang Involvement:No   Subjective:  Patient today continues work on her depression and anxiety especially related to family/son issues, her cancer, and situation with family friend's sudden death this past week.     Patient today needing session to vent a lot of anxious/depressive thoughts and feelings about her cancer and outlook. Hurt by others in some cases and not understanding patient. Shared some frustrating/hurtful situations/comments. Does have support from others who are more helpful. Still receiving services from palliative care after her transfer back to palliative from hospice care. Encouraged patient's open expression of her fears, sadness, anxieties, and frustrations in session today and she responded well, and feeling less alone. Feeling more understood as we talked today, as often she reports feeling not understood by family/friends and others at times. Visit with dad since last session went well. Encouraged patient in continuing to use meditation, support of others who want to be helpful, deep breathing exercises to help with her anxiety and stress which she reports is episodic.    Interventions: Cognitive Behavioral Therapy and Ego-Supportive  Long-term goal:  Patient will confront her anxiety, fears, anger reported re: her illness and use time in therapy and beyond to work on this, while receiving feedback and support from therapist. Help her determine what she feels is realistic for her considering her health challenges and difficult prognosis.  Short-term goal: Participate in therapy and initiate communication of needs and desires at this point in her struggle physically.  Strategies: Share and  talk through anxiety, anger, and depression that are experienced and work to find and use helpful coping tools to better manage those feelings.    Diagnosis:   ICD-10-CM   1. Major depressive disorder,  recurrent episode, moderate (HCC)  F33.1      Plan:   Patient today working further on her depression and anxiety related to her cancer, relationship with son, step-daughter, and other family members and friends as she sometimes feels misunderstood and not heard. Looking at some of her communication that might contribute to her not feeling heard and discussed how she can work on some changes that might be helpful.  Her future with her cancer does seem uncertain but currently patient does seem to be managing better and remains on palliative care versus hospice.  Has made progress and needs to continue working with her goal-directed behaviors to keep moving in a forward direction and noticing improvements.  Goal review and progress/challenges noted with patient.  Next appointment within 2 to 3 weeks.   Mathis Fare, LCSW

## 2023-02-26 ENCOUNTER — Other Ambulatory Visit (HOSPITAL_COMMUNITY): Payer: Self-pay

## 2023-02-26 DIAGNOSIS — R911 Solitary pulmonary nodule: Secondary | ICD-10-CM | POA: Diagnosis not present

## 2023-02-27 DIAGNOSIS — J9611 Chronic respiratory failure with hypoxia: Secondary | ICD-10-CM | POA: Diagnosis not present

## 2023-02-28 ENCOUNTER — Other Ambulatory Visit (HOSPITAL_COMMUNITY): Payer: Self-pay

## 2023-03-01 ENCOUNTER — Telehealth: Payer: Self-pay

## 2023-03-01 NOTE — Telephone Encounter (Signed)
3 pm.  Message received from [facility] regarding patient needing to speak with Palliative Care Team.  Return call made and no answer.  Message left requesting call back.   305 pm.  Patient returned call and states she was advised to contact Palliative Care before going to the hospital.  We discussed differences between [facility] and Hospice programs.  Advised patient she did not need approval from [facility] to be seen in the ED.  Patient is concerned that she maybe septic again.  Running fevers of 102 F for the last 2 days.  Increase fatigue and weakness reported.  Encouraged patient to be seen in the ED given her symptoms.  Patient aware PC will follow up with spouse next week regarding appointment if she is not admitted to the hospital. Patient verbalized understanding and stated she is going to be evaluated in the ED.

## 2023-03-01 NOTE — Telephone Encounter (Signed)
Patient called to report fever over 102 x 2 days and cough. She was very upset given her heart failure and pulmonary issues that have sent her to Palliative Care. Advised her to contact palliative care regarding fever and to be seen in ED if they are ok with that.

## 2023-03-04 ENCOUNTER — Encounter: Payer: Self-pay | Admitting: Adult Health

## 2023-03-04 ENCOUNTER — Telehealth (INDEPENDENT_AMBULATORY_CARE_PROVIDER_SITE_OTHER): Payer: BC Managed Care – PPO | Admitting: Adult Health

## 2023-03-04 DIAGNOSIS — F331 Major depressive disorder, recurrent, moderate: Secondary | ICD-10-CM

## 2023-03-04 DIAGNOSIS — G4700 Insomnia, unspecified: Secondary | ICD-10-CM

## 2023-03-04 DIAGNOSIS — F410 Panic disorder [episodic paroxysmal anxiety] without agoraphobia: Secondary | ICD-10-CM

## 2023-03-04 DIAGNOSIS — F411 Generalized anxiety disorder: Secondary | ICD-10-CM

## 2023-03-04 MED ORDER — LAMOTRIGINE 25 MG PO TABS
25.0000 mg | ORAL_TABLET | Freq: Every day | ORAL | 2 refills | Status: DC
Start: 2023-03-04 — End: 2023-05-06

## 2023-03-04 NOTE — Progress Notes (Signed)
[name] [id number] October 01, 1971 52 y.o.  Virtual Visit via Video Note  I connected with pt @ on 03/04/23 at  8:20 AM EDT by a video enabled telemedicine application and verified that I am speaking with the correct person using two identifiers.   I discussed the limitations of evaluation and management by telemedicine and the availability of in person appointments. The patient expressed understanding and agreed to proceed.  I discussed the assessment and treatment plan with the patient. The patient was provided an opportunity to ask questions and all were answered. The patient agreed with the plan and demonstrated an understanding of the instructions.   The patient was advised to call back or seek an in-person evaluation if the symptoms worsen or if the condition fails to improve as anticipated.  I provided 25 minutes of non-face-to-face time during this encounter.  The patient was located at home.  The provider was located at [facility].   Susan Gibbs, NP   Subjective:   Patient ID:  [name] is a 52 y.o. (DOB 1970/10/06) female.  Chief Complaint: No chief complaint on file.   HPI [name] presents for follow-up of  MDD, GAD, insomnia, and panic attacks.  Hospice care.  Describes mood today as "about the same". Pleasant. Tearful throughout interview. Mood symptoms - reports depression - "defeated". Denies anxiety and irritability. Reports panic attacks. Reports decreased worry, rumination, and over thinking. Mood is low. Stating "I just want to feel better". Feels like medications are helpful, but is willing to consider other options. Remains in palliative care. Family supportive. Decreased interest and motivation. Taking medications as prescribed. Working with [name] for therapy.  Energy levels low. Active, does not have a regular exercise routine with current physical disabilities. Unable to enjoy usual interests and activities. Married. Lives  with husband and s-16. Has a daughter - 80 in [place]. Spending time with family.  Appetite decreased. Weight loss - 167 pounds. Sleeps better some nights than others. Averages 4 hours in a 24 hour period. Focus and concentration difficulties - "memory issues getting worse and worse". Completing tasks. Managing aspects of household. Disabled since 2016. Denies SI or HI.  Denies AH or VH. Denies self harm. Denies substance use.   Previous medication trials: Wellbutrin - mean, Zoloft-not helpful  Review of Systems:  Review of Systems  Musculoskeletal:  Negative for gait problem.  Neurological:  Negative for tremors.  Psychiatric/Behavioral:         Please refer to HPI    Medications: I have reviewed the patient's current medications.  Current Outpatient Medications  Medication Sig Dispense Refill   albuterol (PROVENTIL) (2.5 MG/3ML) 0.083% nebulizer solution Take 3 mLs (2.5 mg total) by nebulization every 6 (six) hours as needed for wheezing or shortness of breath. 360 mL 2   aspirin EC 81 MG tablet Take 1 tablet (81 mg total) by mouth daily. Swallow whole. (Patient taking differently: Take 81 mg by mouth at bedtime. Chewable) 90 tablet 3   Bismuth Tribromoph-Petrolatum (XEROFORM OCCLUSIVE GAUZE STRIP) PADS Apply 1 each topically as directed. 50 each 1   bumetanide (BUMEX) 1 MG tablet TAKE 1 TABLET BY MOUTH TWICE A DAY (Patient taking differently: Take 1 mg by mouth 2 (two) times daily.) 180 tablet 3   buPROPion (WELLBUTRIN XL) 150 MG 24 hr tablet TAKE 1 TABLET BY MOUTH EVERY DAY 90 tablet 0   clonazePAM (KLONOPIN) 0.5 MG tablet Take one tablet four times daily. 120 tablet  2   clonazePAM (KLONOPIN) 1 MG tablet Take 1 tablet (1 mg total) by mouth 2 (two) times daily. 60 tablet 1   dexlansoprazole (DEXILANT) 60 MG capsule Take 60 mg by mouth daily.     Diclofenac Potassium,Migraine, (CAMBIA) 50 MG PACK Take 50 mg by mouth daily as needed for migraine.     diphenhydrAMINE (BENADRYL) 12.5  MG/5ML elixir Take 10 mLs (25 mg total) by mouth every 6 (six) hours as needed (nausea). 120 mL 0   dronabinol (MARINOL) 2.5 MG capsule TAKE 1 CAPSULE BY MOUTH TWICE A DAY AS NEEDED NAUSEA (Patient taking differently: Take 2.5 mg by mouth at bedtime.) 60 capsule 0   EMGALITY 120 MG/ML SOSY Inject 120 mg into the skin every 28 (twenty-eight) days.     EPINEPHRINE 0.3 mg/0.3 mL IJ SOAJ injection INJECT 0.3 MG INTO THE MUSCLE AS NEEDED FOR ANAPHYLAXIS. 2 each 1   escitalopram (LEXAPRO) 20 MG tablet TAKE 1 TABLET BY MOUTH EVERY DAY IN THE EVENING 90 tablet 1   FLORASTOR 250 MG capsule Take 250 mg by mouth See admin instructions. Take 250 mg by mouth mid-morning and mid-afternoon (1600)     fluticasone-salmeterol (ADVAIR HFA) 230-21 MCG/ACT inhaler INHALE 2 PUFFS INTO THE LUNGS TWICE A DAY (Patient taking differently: Inhale 2 puffs into the lungs 2 (two) times daily.) 12 each 5   guaiFENesin (ROBITUSSIN) 100 MG/5ML liquid Take 15 mLs by mouth every 6 (six) hours as needed for cough or to loosen phlegm. 120 mL 0   hydrocortisone (CORTEF) 10 MG tablet Take 1-2 tablets (10-20 mg total) by mouth See admin instructions. Take 20 mg in the am and 10mg  in the evening. Take after completing prednisone     HYDROmorphone (DILAUDID) 4 MG tablet Take 1 tablet (4 mg total) by mouth every 4 (four) hours as needed. 90 tablet 0   HYDROmorphone (DILAUDID) 4 MG tablet Take 1 tablet (4 mg total) by mouth every 4 (four) hours, as needed 90 tablet 0   hydrOXYzine (ATARAX) 25 MG tablet Take 1 tablet (25 mg total) by mouth every 6 (six) hours as needed for anxiety. 30 tablet 0   lamoTRIgine (LAMICTAL) 200 MG tablet TAKE 1 TABLET BY MOUTH EVERYDAY AT BEDTIME (Patient taking differently: Take 200 mg by mouth at bedtime.) 30 tablet 5   Lancets (ONETOUCH DELICA PLUS LANCET33G) MISC 3 (three) times daily. for testing     LORazepam (ATIVAN) 0.5 MG tablet Take 1 tablet (0.5 mg total) by mouth 3 (three) times daily as needed for anxiety.  12 tablet 0   menthol-cetylpyridinium (CEPACOL) 3 MG lozenge Take 1 lozenge (3 mg total) by mouth as needed for sore throat. 100 tablet 0   nitroGLYCERIN (NITROSTAT) 0.4 MG SL tablet Dissolve 1 tablet under the tongue every 5 minutes as needed for chest pain. Max of 3 doses, then 911. 25 tablet 6   ONETOUCH VERIO test strip 3 (three) times daily. for testing     OXYGEN Inhale 4-5 L/min into the lungs continuous.     PROAIR HFA 108 (90 Base) MCG/ACT inhaler Inhale 2 puffs into the lungs every 4 (four) hours as needed for wheezing or shortness of breath.     QVAR REDIHALER 80 MCG/ACT inhaler INHALE 1 PUFF INTO THE LUNGS TWICE A DAY (Patient taking differently: Inhale 1 puff into the lungs 2 (two) times daily.) 31.8 g 2   Rimegepant Sulfate (NURTEC) 75 MG TBDP Take 75 mg by mouth daily as needed (Migraine).  rosuvastatin (CRESTOR) 10 MG tablet Take 10 mg by mouth daily. Mid morning     sacubitril-valsartan (ENTRESTO) 24-26 MG Take 1 tablet by mouth 2 (two) times daily. 60 tablet 11   silver sulfADIAZINE (SILVADENE) 1 % cream Apply to affected area daily 50 g 1   sitaGLIPtin (JANUVIA) 100 MG tablet Take 1 tablet (100 mg total) by mouth daily.     Sodium Chloride Flush (NORMAL SALINE FLUSH) 0.9 % SOLN Use as directed for central venous catheter maintenance 600 mL 3   sucralfate (CARAFATE) 1 GM/10ML suspension Take 1 g by mouth daily as needed (as directed for ulcers).     SYNTHROID 100 MCG tablet Take 1 tablet (100 mcg total) by mouth every morning. (Patient taking differently: Take 100 mcg by mouth every morning. * Must be name brand*)     Tiotropium Bromide Monohydrate (SPIRIVA RESPIMAT) 1.25 MCG/ACT AERS Inhale 2 puffs into the lungs daily. 4 g 5   topiramate (TOPAMAX) 50 MG tablet Take 150 mg by mouth at bedtime.      zolpidem (AMBIEN CR) 12.5 MG CR tablet Take 12.5 mg by mouth at bedtime.     No current facility-administered medications for this visit.    Medication Side Effects:  None  Allergies:  Allergies  Allergen Reactions   Ferrous Bisglycinate Chelate [Iron] Anaphylaxis and Other (See Comments)    Only IV - only FERRLICET   Mushroom Extract Complex Anaphylaxis   Na Ferric Gluc Cplx In Sucrose Anaphylaxis   Cymbalta [Duloxetine Hcl] Swelling and Anxiety   Hydromorphone Other (See Comments)    BP drop and heart rate drops 5.1.20 PT REPORTS THAT SHE TAKES DILAUDID AT HOME   Ondansetron Hcl Other (See Comments)   Promethazine Other (See Comments)    Other reaction(s): Unknown   Succinylcholine Other (See Comments)    Lock Jaw   Buprenorphine Hcl Hives   Compazine Other (See Comments)    Altered mental status Aggression   Duloxetine     Other reaction(s): Not available   Metoclopramide Other (See Comments)    Dystonia   Morphine And Codeine Hives   Ondansetron Hives and Rash    Other reaction(s): Unknown Other reaction(s): Unknown   Promethazine Hcl Hives   Tegaderm Ag Mesh [Silver] Rash    Old formulation only, is able to tolerate new formulation    Past Medical History:  Diagnosis Date   Addison's disease (HCC)    Adrenal insufficiency (HCC)    Anemia    Anxiety    Aortic stenosis    Aortic stenosis    Appendicitis 12/19/09   Appendicitis    Breast cancer (HCC)    STATUS POST BILATERAL MASTECTOMY. STATUS POST RECONSTRUCTION. SHE HAD SILICONE BREAST IMPLANTS AND THE LEFT IMPLANT IS LEAKING SLIGHTLY   Cellulitis of right middle finger 11/07/2018   Cervical cancer (HCC) 12/23/2018   Chest pain    CHF with right heart failure (HCC) 04/17/2017   Chronic respiratory failure with hypoxia (HCC) 12/23/2018   Cough variant asthma 04/13/2019   Depression    GERD (gastroesophageal reflux disease)    takes Dexilant and carafate and gi coctail    Headache    migraines on a daily and monthly regimen    Heart murmur    History of kidney stones    Hodgkin lymphoma (HCC)    STATUS POST MANTLE RADIATION   Hodgkin's lymphoma (HCC)    1987    Hypertension    Hypoxia    Necrotizing fasciitis (  HCC) 12/23/2018   Non-ischemic cardiomyopathy (HCC)    Osteoporosis    Palpitations    Pituitary adenoma (HCC) 12/23/2018   Pneumonia    PONV (postoperative nausea and vomiting)    Pre-diabetes    per pt; no meds   Pulmonary hypertension (HCC) 12/23/2018   Raynaud phenomenon    Right heart failure (HCC) 04/17/2017   Seizures (HCC)    last febrile seizure was approx 3 weeks ago per report on 12/01/2020   Sleep apnea    upcoming sleep study per pt    Supplemental oxygen dependent    3 liters   SVT (supraventricular tachycardia)    Tachycardia    Thyroid cancer (HCC)    STATUS POST SURGICAL REMOVAL-CURRENT ON THYROID REPLACEMENT    Family History  Family history unknown: Yes    Social History   Socioeconomic History   Marital status: Married    Spouse name: Not on file   Number of children: Not on file   Years of education: Not on file   Highest education level: Not on file  Occupational History   Not on file  Tobacco Use   Smoking status: Never   Smokeless tobacco: Never  Vaping Use   Vaping Use: Never used  Substance and Sexual Activity   Alcohol use: Not Currently    Comment: social    Drug use: No   Sexual activity: Yes    Birth control/protection: Surgical  Other Topics Concern   Not on file  Social History Narrative   Not on file   Social Determinants of Health   Financial Resource Strain: Not on file  Food Insecurity: No Food Insecurity (01/23/2023)   Hunger Vital Sign    Worried About Running Out of Food in the Last Year: Never true    Ran Out of Food in the Last Year: Never true  Transportation Needs: No Transportation Needs (01/23/2023)   PRAPARE - Administrator, Civil Service (Medical): No    Lack of Transportation (Non-Medical): No  Physical Activity: Not on file  Stress: Not on file  Social Connections: Not on file  Intimate Partner Violence: Not At Risk (01/23/2023)   Humiliation,  Afraid, Rape, and Kick questionnaire    Fear of Current or Ex-Partner: No    Emotionally Abused: No    Physically Abused: No    Sexually Abused: No    Past Medical History, Surgical history, Social history, and Family history were reviewed and updated as appropriate.   Please see review of systems for further details on the patient's review from today.   Objective:   Physical Exam:  LMP  (LMP Unknown)   Physical Exam Constitutional:      General: She is not in acute distress. Musculoskeletal:        General: No deformity.  Neurological:     Mental Status: She is alert and oriented to person, place, and time.     Coordination: Coordination normal.  Psychiatric:        Attention and Perception: Attention and perception normal. She does not perceive auditory or visual hallucinations.        Mood and Affect: Mood normal. Mood is not anxious or depressed. Affect is not labile, blunt, angry or inappropriate.        Speech: Speech normal.        Behavior: Behavior normal.        Thought Content: Thought content normal. Thought content is not paranoid or delusional. Thought content  does not include homicidal or suicidal ideation. Thought content does not include homicidal or suicidal plan.        Cognition and Memory: Cognition and memory normal.        Judgment: Judgment normal.     Comments: Insight intact     Lab Review:     Component Value Date/Time   NA 137 01/23/2023 0542   NA 140 08/20/2022 1513   K 3.2 (L) 01/23/2023 0542   CL 104 01/23/2023 0542   CO2 23 01/23/2023 0542   GLUCOSE 116 (H) 01/23/2023 0542   BUN 20 01/23/2023 0542   BUN 28 (H) 08/20/2022 1513   CREATININE 1.28 (H) 01/23/2023 0542   CREATININE 1.08 (H) 02/09/2022 1202   CALCIUM 8.2 (L) 01/23/2023 0542   PROT 8.1 01/22/2023 1823   PROT 7.1 01/23/2018 1225   ALBUMIN 3.5 01/22/2023 1823   ALBUMIN 4.4 01/23/2018 1225   AST 27 01/22/2023 1823   AST 26 02/09/2022 1202   ALT 46 (H) 01/22/2023 1823   ALT  40 02/09/2022 1202   ALKPHOS 93 01/22/2023 1823   BILITOT 0.6 01/22/2023 1823   BILITOT 0.4 02/09/2022 1202   GFRNONAA 51 (L) 01/23/2023 0542   GFRNONAA >60 02/09/2022 1202   GFRAA >60 04/27/2020 0613   GFRAA >60 02/10/2020 1020       Component Value Date/Time   WBC 9.8 01/23/2023 0542   RBC 3.43 (L) 01/23/2023 0542   HGB 8.1 (L) 01/23/2023 0542   HGB 10.6 (L) 08/20/2022 1513   HCT 26.8 (L) 01/23/2023 0542   HCT 34.4 08/20/2022 1513   PLT 248 01/23/2023 0542   PLT 414 08/20/2022 1513   MCV 78.1 (L) 01/23/2023 0542   MCV 78 (L) 08/20/2022 1513   MCH 23.6 (L) 01/23/2023 0542   MCHC 30.2 01/23/2023 0542   RDW 18.5 (H) 01/23/2023 0542   RDW 16.5 (H) 08/20/2022 1513   LYMPHSABS 1.9 01/23/2023 0542   MONOABS 1.1 (H) 01/23/2023 0542   EOSABS 0.0 01/23/2023 0542   BASOSABS 0.0 01/23/2023 0542    No results found for: "POCLITH", "LITHIUM"   No results found for: "PHENYTOIN", "PHENOBARB", "VALPROATE", "CBMZ"   .res Assessment: Plan:    Plan:  PDMP reviewed  Lexapro 20mg  daily Clonazepam 0.5mg  - 4 times daily. Wellbutrin XL 150mg  every morning - did not tolerate increase in dose - reports seizure history Lamictal 200mg  daily  Add Lamictal 25mg  every morning  Seeing [name] therapy visits.  Discussed potential benefits, risk, and side effects of benzodiazepines to include potential risk of tolerance and dependence, as well as possible drowsiness.  Advised patient not to drive if experiencing drowsiness and to take lowest possible effective dose to minimize risk of dependence and tolerance.   Counseled patient regarding potential benefits, risks, and side effects of Lamictal to include potential risk of Stevens-Johnson syndrome. Advised patient to stop taking Lamictal and contact office immediately if rash develops and to seek urgent medical attention if rash is severe and/or spreading quickly.  RTC 4 weeks  Patient advised to contact office with any questions,  adverse effects, or acute worsening in signs and symptoms.  There are no diagnoses linked to this encounter.   Please see After Visit Summary for patient specific instructions.  Future Appointments  Date Time Provider Department Center  03/04/2023  8:20 AM Jemila Camille, Thereasa Solo, NP [facility] None  03/18/2023 11:00 AM Mathis Fare, LCSW [facility] None    No orders of the defined types were placed in  this encounter.     -------------------------------

## 2023-03-05 ENCOUNTER — Other Ambulatory Visit (HOSPITAL_COMMUNITY): Payer: Self-pay

## 2023-03-05 MED ORDER — HEPARIN NA (PORK) LOCK FLSH PF 100 UNIT/ML IV SOLN
INTRAVENOUS | 3 refills | Status: DC
  Filled 2023-03-05: qty 150, 30d supply, fill #0
  Filled 2023-03-27: qty 150, 30d supply, fill #1
  Filled 2023-04-30: qty 150, 30d supply, fill #2
  Filled 2023-05-14: qty 150, 30d supply, fill #3

## 2023-03-05 MED ORDER — NORMAL SALINE FLUSH 0.9 % IV SOLN
INTRAVENOUS | 3 refills | Status: DC
  Filled 2023-03-05: qty 600, 30d supply, fill #0
  Filled 2023-03-21: qty 600, 30d supply, fill #1
  Filled 2023-04-30 – 2023-05-02 (×3): qty 600, 30d supply, fill #2
  Filled 2023-05-14: qty 600, 30d supply, fill #3

## 2023-03-08 ENCOUNTER — Other Ambulatory Visit: Payer: Self-pay | Admitting: Adult Health

## 2023-03-08 DIAGNOSIS — F331 Major depressive disorder, recurrent, moderate: Secondary | ICD-10-CM

## 2023-03-09 ENCOUNTER — Inpatient Hospital Stay (HOSPITAL_COMMUNITY)
Admission: EM | Admit: 2023-03-09 | Discharge: 2023-03-13 | DRG: 871 | Disposition: A | Discharge status: Home / Self Care | Payer: BC Managed Care – PPO | Attending: Internal Medicine | Admitting: Internal Medicine

## 2023-03-09 ENCOUNTER — Emergency Department (HOSPITAL_COMMUNITY): Payer: BC Managed Care – PPO

## 2023-03-09 ENCOUNTER — Other Ambulatory Visit: Payer: Self-pay

## 2023-03-09 DIAGNOSIS — F419 Anxiety disorder, unspecified: Secondary | ICD-10-CM | POA: Diagnosis present

## 2023-03-09 DIAGNOSIS — Z853 Personal history of malignant neoplasm of breast: Secondary | ICD-10-CM

## 2023-03-09 DIAGNOSIS — Z515 Encounter for palliative care: Secondary | ICD-10-CM

## 2023-03-09 DIAGNOSIS — Z885 Allergy status to narcotic agent status: Secondary | ICD-10-CM

## 2023-03-09 DIAGNOSIS — M810 Age-related osteoporosis without current pathological fracture: Secondary | ICD-10-CM | POA: Diagnosis present

## 2023-03-09 DIAGNOSIS — E271 Primary adrenocortical insufficiency: Secondary | ICD-10-CM | POA: Diagnosis present

## 2023-03-09 DIAGNOSIS — E890 Postprocedural hypothyroidism: Secondary | ICD-10-CM | POA: Diagnosis present

## 2023-03-09 DIAGNOSIS — I428 Other cardiomyopathies: Secondary | ICD-10-CM | POA: Diagnosis not present

## 2023-03-09 DIAGNOSIS — I350 Nonrheumatic aortic (valve) stenosis: Secondary | ICD-10-CM | POA: Diagnosis not present

## 2023-03-09 DIAGNOSIS — I5082 Biventricular heart failure: Secondary | ICD-10-CM | POA: Diagnosis present

## 2023-03-09 DIAGNOSIS — R627 Adult failure to thrive: Secondary | ICD-10-CM

## 2023-03-09 DIAGNOSIS — I2729 Other secondary pulmonary hypertension: Secondary | ICD-10-CM | POA: Diagnosis not present

## 2023-03-09 DIAGNOSIS — E785 Hyperlipidemia, unspecified: Secondary | ICD-10-CM | POA: Diagnosis not present

## 2023-03-09 DIAGNOSIS — I10 Essential (primary) hypertension: Secondary | ICD-10-CM | POA: Diagnosis present

## 2023-03-09 DIAGNOSIS — N1831 Chronic kidney disease, stage 3a: Secondary | ICD-10-CM | POA: Insufficient documentation

## 2023-03-09 DIAGNOSIS — F411 Generalized anxiety disorder: Secondary | ICD-10-CM | POA: Diagnosis present

## 2023-03-09 DIAGNOSIS — E876 Hypokalemia: Secondary | ICD-10-CM | POA: Diagnosis present

## 2023-03-09 DIAGNOSIS — I5022 Chronic systolic (congestive) heart failure: Secondary | ICD-10-CM | POA: Diagnosis present

## 2023-03-09 DIAGNOSIS — J189 Pneumonia, unspecified organism: Secondary | ICD-10-CM | POA: Diagnosis present

## 2023-03-09 DIAGNOSIS — Z89022 Acquired absence of left finger(s): Secondary | ICD-10-CM

## 2023-03-09 DIAGNOSIS — I7300 Raynaud's syndrome without gangrene: Secondary | ICD-10-CM | POA: Diagnosis present

## 2023-03-09 DIAGNOSIS — I5189 Other ill-defined heart diseases: Secondary | ICD-10-CM

## 2023-03-09 DIAGNOSIS — R0689 Other abnormalities of breathing: Secondary | ICD-10-CM | POA: Diagnosis not present

## 2023-03-09 DIAGNOSIS — Z66 Do not resuscitate: Secondary | ICD-10-CM | POA: Diagnosis present

## 2023-03-09 DIAGNOSIS — A419 Sepsis, unspecified organism: Principal | ICD-10-CM | POA: Diagnosis present

## 2023-03-09 DIAGNOSIS — Z923 Personal history of irradiation: Secondary | ICD-10-CM

## 2023-03-09 DIAGNOSIS — Z7984 Long term (current) use of oral hypoglycemic drugs: Secondary | ICD-10-CM

## 2023-03-09 DIAGNOSIS — Z9981 Dependence on supplemental oxygen: Secondary | ICD-10-CM

## 2023-03-09 DIAGNOSIS — J181 Lobar pneumonia, unspecified organism: Secondary | ICD-10-CM | POA: Diagnosis present

## 2023-03-09 DIAGNOSIS — Z1152 Encounter for screening for COVID-19: Secondary | ICD-10-CM

## 2023-03-09 DIAGNOSIS — Z8571 Personal history of Hodgkin lymphoma: Secondary | ICD-10-CM

## 2023-03-09 DIAGNOSIS — R110 Nausea: Secondary | ICD-10-CM | POA: Diagnosis present

## 2023-03-09 DIAGNOSIS — K219 Gastro-esophageal reflux disease without esophagitis: Secondary | ICD-10-CM | POA: Diagnosis present

## 2023-03-09 DIAGNOSIS — D509 Iron deficiency anemia, unspecified: Secondary | ICD-10-CM | POA: Diagnosis present

## 2023-03-09 DIAGNOSIS — Z9102 Food additives allergy status: Secondary | ICD-10-CM

## 2023-03-09 DIAGNOSIS — J440 Chronic obstructive pulmonary disease with acute lower respiratory infection: Secondary | ICD-10-CM | POA: Diagnosis not present

## 2023-03-09 DIAGNOSIS — I130 Hypertensive heart and chronic kidney disease with heart failure and stage 1 through stage 4 chronic kidney disease, or unspecified chronic kidney disease: Secondary | ICD-10-CM | POA: Diagnosis not present

## 2023-03-09 DIAGNOSIS — Z888 Allergy status to other drugs, medicaments and biological substances status: Secondary | ICD-10-CM

## 2023-03-09 DIAGNOSIS — Z79890 Hormone replacement therapy (postmenopausal): Secondary | ICD-10-CM

## 2023-03-09 DIAGNOSIS — F32A Depression, unspecified: Secondary | ICD-10-CM | POA: Diagnosis not present

## 2023-03-09 DIAGNOSIS — Z85850 Personal history of malignant neoplasm of thyroid: Secondary | ICD-10-CM

## 2023-03-09 DIAGNOSIS — Z7982 Long term (current) use of aspirin: Secondary | ICD-10-CM

## 2023-03-09 DIAGNOSIS — J9600 Acute respiratory failure, unspecified whether with hypoxia or hypercapnia: Secondary | ICD-10-CM | POA: Diagnosis not present

## 2023-03-09 DIAGNOSIS — Z9221 Personal history of antineoplastic chemotherapy: Secondary | ICD-10-CM

## 2023-03-09 DIAGNOSIS — Z79899 Other long term (current) drug therapy: Secondary | ICD-10-CM

## 2023-03-09 DIAGNOSIS — J9611 Chronic respiratory failure with hypoxia: Secondary | ICD-10-CM | POA: Diagnosis not present

## 2023-03-09 DIAGNOSIS — R509 Fever, unspecified: Secondary | ICD-10-CM | POA: Diagnosis not present

## 2023-03-09 DIAGNOSIS — G4733 Obstructive sleep apnea (adult) (pediatric): Secondary | ICD-10-CM | POA: Diagnosis present

## 2023-03-09 DIAGNOSIS — R000 Tachycardia, unspecified: Secondary | ICD-10-CM | POA: Diagnosis not present

## 2023-03-09 DIAGNOSIS — Z8541 Personal history of malignant neoplasm of cervix uteri: Secondary | ICD-10-CM

## 2023-03-09 DIAGNOSIS — R7303 Prediabetes: Secondary | ICD-10-CM | POA: Diagnosis present

## 2023-03-09 DIAGNOSIS — Z9013 Acquired absence of bilateral breasts and nipples: Secondary | ICD-10-CM

## 2023-03-09 DIAGNOSIS — D649 Anemia, unspecified: Secondary | ICD-10-CM | POA: Diagnosis present

## 2023-03-09 DIAGNOSIS — E2740 Unspecified adrenocortical insufficiency: Secondary | ICD-10-CM | POA: Diagnosis present

## 2023-03-09 DIAGNOSIS — J4550 Severe persistent asthma, uncomplicated: Secondary | ICD-10-CM

## 2023-03-09 DIAGNOSIS — I50810 Right heart failure, unspecified: Secondary | ICD-10-CM | POA: Diagnosis present

## 2023-03-09 DIAGNOSIS — Z7951 Long term (current) use of inhaled steroids: Secondary | ICD-10-CM

## 2023-03-09 LAB — COMPREHENSIVE METABOLIC PANEL
ALT: 36 U/L (ref 0–44)
AST: 22 U/L (ref 15–41)
Albumin: 3.6 g/dL (ref 3.5–5.0)
Alkaline Phosphatase: 55 U/L (ref 38–126)
Anion gap: 12 (ref 5–15)
BUN: 31 mg/dL — ABNORMAL HIGH (ref 6–20)
CO2: 24 mmol/L (ref 22–32)
Calcium: 8.9 mg/dL (ref 8.9–10.3)
Chloride: 99 mmol/L (ref 98–111)
Creatinine, Ser: 1.26 mg/dL — ABNORMAL HIGH (ref 0.44–1.00)
GFR, Estimated: 52 mL/min — ABNORMAL LOW (ref >60)
Glucose, Bld: 110 mg/dL — ABNORMAL HIGH (ref 70–99)
Potassium: 3.2 mmol/L — ABNORMAL LOW (ref 3.5–5.1)
Sodium: 135 mmol/L (ref 135–145)
Total Bilirubin: 0.4 mg/dL (ref 0.3–1.2)
Total Protein: 7.3 g/dL (ref 6.5–8.1)

## 2023-03-09 LAB — CBC WITH DIFFERENTIAL/PLATELET
Abs Immature Granulocytes: 0.33 10*3/uL — ABNORMAL HIGH (ref 0.00–0.07)
Basophils Absolute: 0.1 10*3/uL (ref 0.0–0.1)
Basophils Relative: 0 %
Eosinophils Absolute: 0.1 10*3/uL (ref 0.0–0.5)
Eosinophils Relative: 1 %
HCT: 29.5 % — ABNORMAL LOW (ref 36.0–46.0)
Hemoglobin: 9 g/dL — ABNORMAL LOW (ref 12.0–15.0)
Immature Granulocytes: 2 %
Lymphocytes Relative: 13 %
Lymphs Abs: 2.6 10*3/uL (ref 0.7–4.0)
MCH: 24.4 pg — ABNORMAL LOW (ref 26.0–34.0)
MCHC: 30.5 g/dL (ref 30.0–36.0)
MCV: 79.9 fL — ABNORMAL LOW (ref 80.0–100.0)
Monocytes Absolute: 1.1 10*3/uL — ABNORMAL HIGH (ref 0.1–1.0)
Monocytes Relative: 5 %
Neutro Abs: 15.9 10*3/uL — ABNORMAL HIGH (ref 1.7–7.7)
Neutrophils Relative %: 79 %
Platelets: 380 10*3/uL (ref 150–400)
RBC: 3.69 MIL/uL — ABNORMAL LOW (ref 3.87–5.11)
RDW: 18.6 % — ABNORMAL HIGH (ref 11.5–15.5)
WBC: 20.1 10*3/uL — ABNORMAL HIGH (ref 4.0–10.5)
nRBC: 0 % (ref 0.0–0.2)

## 2023-03-09 LAB — LACTIC ACID, PLASMA
Lactic Acid, Venous: 1 mmol/L (ref 0.5–1.9)
Lactic Acid, Venous: 0.9 mmol/L (ref 0.5–1.9)

## 2023-03-09 LAB — IRON AND TIBC
Iron: 23 ug/dL — ABNORMAL LOW (ref 28–170)
Saturation Ratios: 5 % — ABNORMAL LOW (ref 10.4–31.8)
TIBC: 442 ug/dL (ref 250–450)
UIBC: 419 ug/dL

## 2023-03-09 LAB — TSH: TSH: 1.899 u[IU]/mL (ref 0.350–4.500)

## 2023-03-09 LAB — URINALYSIS, W/ REFLEX TO CULTURE (INFECTION SUSPECTED)
Bacteria, UA: NONE SEEN
Bilirubin Urine: NEGATIVE
Glucose, UA: NEGATIVE mg/dL
Hgb urine dipstick: NEGATIVE
Ketones, ur: NEGATIVE mg/dL
Leukocytes,Ua: NEGATIVE
Nitrite: NEGATIVE
Protein, ur: NEGATIVE mg/dL
Specific Gravity, Urine: 1.008 (ref 1.005–1.030)
pH: 5 (ref 5.0–8.0)

## 2023-03-09 LAB — MAGNESIUM: Magnesium: 2 mg/dL (ref 1.7–2.4)

## 2023-03-09 LAB — RESP PANEL BY RT-PCR (RSV, FLU A&B, COVID)  RVPGX2
Influenza A by PCR: NEGATIVE
Influenza B by PCR: NEGATIVE
Resp Syncytial Virus by PCR: NEGATIVE
SARS Coronavirus 2 by RT PCR: NEGATIVE

## 2023-03-09 LAB — EXPECTORATED SPUTUM ASSESSMENT W GRAM STAIN, RFLX TO RESP C

## 2023-03-09 LAB — PROTIME-INR
INR: 1.1 (ref 0.8–1.2)
Prothrombin Time: 14 seconds (ref 11.4–15.2)

## 2023-03-09 LAB — APTT: aPTT: 43 seconds — ABNORMAL HIGH (ref 24–36)

## 2023-03-09 LAB — FERRITIN: Ferritin: 17 ng/mL (ref 11–307)

## 2023-03-09 LAB — VITAMIN B12: Vitamin B-12: 568 pg/mL (ref 180–914)

## 2023-03-09 LAB — PROCALCITONIN: Procalcitonin: 0.23 ng/mL

## 2023-03-09 LAB — STREP PNEUMONIAE URINARY ANTIGEN: Strep Pneumo Urinary Antigen: NEGATIVE

## 2023-03-09 MED ORDER — TOPIRAMATE 25 MG PO TABS
150.0000 mg | ORAL_TABLET | Freq: Every day | ORAL | Status: DC
  Administered 2023-03-09 – 2023-03-12 (×4): 150 mg via ORAL
  Filled 2023-03-09 (×4): qty 2

## 2023-03-09 MED ORDER — IPRATROPIUM-ALBUTEROL 0.5-2.5 (3) MG/3ML IN SOLN
3.0000 mL | Freq: Four times a day (QID) | RESPIRATORY_TRACT | Status: AC
  Administered 2023-03-09 – 2023-03-10 (×2): 3 mL via RESPIRATORY_TRACT
  Filled 2023-03-09 (×2): qty 3

## 2023-03-09 MED ORDER — DIPHENHYDRAMINE HCL 12.5 MG/5ML PO ELIX: 25.0000 mg | ORAL_SOLUTION | Freq: Four times a day (QID) | ORAL | Status: DC | PRN

## 2023-03-09 MED ORDER — DIPHENHYDRAMINE HCL 50 MG/ML IJ SOLN
25.0000 mg | Freq: Three times a day (TID) | INTRAMUSCULAR | Status: DC | PRN
  Administered 2023-03-09 – 2023-03-10 (×2): 25 mg via INTRAVENOUS
  Filled 2023-03-09 (×2): qty 1

## 2023-03-09 MED ORDER — ESCITALOPRAM OXALATE 20 MG PO TABS
20.0000 mg | ORAL_TABLET | Freq: Every evening | ORAL | Status: DC
  Administered 2023-03-09 – 2023-03-12 (×4): 20 mg via ORAL
  Filled 2023-03-09 (×4): qty 1

## 2023-03-09 MED ORDER — PANTOPRAZOLE SODIUM 40 MG PO TBEC
40.0000 mg | DELAYED_RELEASE_TABLET | Freq: Every day | ORAL | Status: DC
  Administered 2023-03-10 – 2023-03-12 (×3): 40 mg via ORAL
  Filled 2023-03-09 (×3): qty 1

## 2023-03-09 MED ORDER — LORAZEPAM 2 MG/ML IJ SOLN: 0.5000 mg | Freq: Three times a day (TID) | INTRAMUSCULAR | Status: DC | PRN

## 2023-03-09 MED ORDER — ALBUTEROL SULFATE (2.5 MG/3ML) 0.083% IN NEBU
2.5000 mg | INHALATION_SOLUTION | Freq: Four times a day (QID) | RESPIRATORY_TRACT | Status: DC | PRN
  Administered 2023-03-10: 2.5 mg via RESPIRATORY_TRACT
  Filled 2023-03-09: qty 3

## 2023-03-09 MED ORDER — TIOTROPIUM BROMIDE MONOHYDRATE 1.25 MCG/ACT IN AERS: 2.0000 | INHALATION_SPRAY | Freq: Every day | RESPIRATORY_TRACT | Status: DC

## 2023-03-09 MED ORDER — ROSUVASTATIN CALCIUM 10 MG PO TABS
10.0000 mg | ORAL_TABLET | Freq: Every evening | ORAL | Status: DC
  Administered 2023-03-09 – 2023-03-13 (×4): 10 mg via ORAL
  Filled 2023-03-09 (×4): qty 1

## 2023-03-09 MED ORDER — SODIUM CHLORIDE 0.9 % IV SOLN
2.0000 g | INTRAVENOUS | Status: DC
  Administered 2023-03-09: 2 g via INTRAVENOUS
  Filled 2023-03-09: qty 20

## 2023-03-09 MED ORDER — HYDROMORPHONE HCL 4 MG PO TABS
4.0000 mg | ORAL_TABLET | ORAL | Status: DC | PRN
  Administered 2023-03-10 – 2023-03-13 (×7): 4 mg via ORAL
  Filled 2023-03-09 (×8): qty 1

## 2023-03-09 MED ORDER — BECLOMETHASONE DIPROP HFA 80 MCG/ACT IN AERB: 1.0000 | INHALATION_SPRAY | Freq: Two times a day (BID) | RESPIRATORY_TRACT | Status: DC

## 2023-03-09 MED ORDER — HYDROCORTISONE SOD SUC (PF) 100 MG IJ SOLR
100.0000 mg | Freq: Once | INTRAMUSCULAR | Status: AC
  Administered 2023-03-09: 100 mg via INTRAVENOUS
  Filled 2023-03-09: qty 2

## 2023-03-09 MED ORDER — SODIUM CHLORIDE 0.9 % IV SOLN
500.0000 mg | INTRAVENOUS | Status: DC
  Administered 2023-03-09: 500 mg via INTRAVENOUS
  Filled 2023-03-09: qty 5

## 2023-03-09 MED ORDER — LACTATED RINGERS IV SOLN: INTRAVENOUS | Status: DC

## 2023-03-09 MED ORDER — ACETAMINOPHEN 325 MG PO TABS: 650.0000 mg | ORAL_TABLET | Freq: Four times a day (QID) | ORAL | Status: DC | PRN

## 2023-03-09 MED ORDER — LORAZEPAM 0.5 MG PO TABS: 0.5000 mg | ORAL_TABLET | Freq: Three times a day (TID) | ORAL | Status: DC | PRN

## 2023-03-09 MED ORDER — DRONABINOL 2.5 MG PO CAPS
2.5000 mg | ORAL_CAPSULE | Freq: Every day | ORAL | Status: DC
  Administered 2023-03-09 – 2023-03-12 (×4): 2.5 mg via ORAL
  Filled 2023-03-09 (×4): qty 1

## 2023-03-09 MED ORDER — HYDROCORTISONE 10 MG PO TABS
10.0000 mg | ORAL_TABLET | Freq: Every day | ORAL | Status: DC
  Administered 2023-03-09 – 2023-03-12 (×4): 10 mg via ORAL
  Filled 2023-03-09 (×4): qty 1

## 2023-03-09 MED ORDER — DIPHENHYDRAMINE HCL 50 MG/ML IJ SOLN
25.0000 mg | Freq: Once | INTRAMUSCULAR | Status: AC
  Administered 2023-03-09: 25 mg via INTRAVENOUS
  Filled 2023-03-09: qty 1

## 2023-03-09 MED ORDER — LACTATED RINGERS IV SOLN: INTRAVENOUS | Status: AC

## 2023-03-09 MED ORDER — LAMOTRIGINE 100 MG PO TABS
200.0000 mg | ORAL_TABLET | Freq: Every day | ORAL | Status: DC
  Administered 2023-03-09 – 2023-03-12 (×4): 200 mg via ORAL
  Filled 2023-03-09 (×4): qty 2

## 2023-03-09 MED ORDER — GUAIFENESIN 100 MG/5ML PO LIQD: 15.0000 mL | Freq: Four times a day (QID) | ORAL | Status: DC | PRN

## 2023-03-09 MED ORDER — BUDESONIDE 0.25 MG/2ML IN SUSP
0.2500 mg | Freq: Two times a day (BID) | RESPIRATORY_TRACT | Status: DC
  Administered 2023-03-09 – 2023-03-13 (×8): 0.25 mg via RESPIRATORY_TRACT
  Filled 2023-03-09 (×8): qty 2

## 2023-03-09 MED ORDER — MOMETASONE FURO-FORMOTEROL FUM 200-5 MCG/ACT IN AERO
2.0000 | INHALATION_SPRAY | Freq: Two times a day (BID) | RESPIRATORY_TRACT | Status: DC
  Administered 2023-03-10 – 2023-03-13 (×7): 2 via RESPIRATORY_TRACT
  Filled 2023-03-09: qty 8.8

## 2023-03-09 MED ORDER — ZOLPIDEM TARTRATE 5 MG PO TABS
5.0000 mg | ORAL_TABLET | Freq: Every evening | ORAL | Status: DC | PRN
  Administered 2023-03-10 – 2023-03-13 (×4): 5 mg via ORAL
  Filled 2023-03-09 (×4): qty 1

## 2023-03-09 MED ORDER — RIMEGEPANT SULFATE 75 MG PO TBDP: 75.0000 mg | ORAL_TABLET | Freq: Every day | ORAL | Status: DC | PRN

## 2023-03-09 MED ORDER — SACCHAROMYCES BOULARDII 250 MG PO CAPS
250.0000 mg | ORAL_CAPSULE | Freq: Two times a day (BID) | ORAL | Status: DC
  Administered 2023-03-09 – 2023-03-13 (×8): 250 mg via ORAL
  Filled 2023-03-09 (×8): qty 1

## 2023-03-09 MED ORDER — LEVOTHYROXINE SODIUM 100 MCG PO TABS: 100.0000 ug | ORAL_TABLET | Freq: Every day | ORAL | Status: DC

## 2023-03-09 MED ORDER — POTASSIUM CHLORIDE CRYS ER 20 MEQ PO TBCR
40.0000 meq | EXTENDED_RELEASE_TABLET | Freq: Once | ORAL | Status: AC
  Administered 2023-03-09: 40 meq via ORAL
  Filled 2023-03-09: qty 2

## 2023-03-09 MED ORDER — HYDROCORTISONE 10 MG PO TABS
20.0000 mg | ORAL_TABLET | Freq: Every day | ORAL | Status: DC
  Administered 2023-03-10 – 2023-03-12 (×3): 20 mg via ORAL
  Filled 2023-03-09 (×3): qty 2

## 2023-03-09 MED ORDER — BUPROPION HCL ER (XL) 150 MG PO TB24
150.0000 mg | ORAL_TABLET | Freq: Every day | ORAL | Status: DC
  Administered 2023-03-10 – 2023-03-13 (×4): 150 mg via ORAL
  Filled 2023-03-09 (×4): qty 1

## 2023-03-09 MED ORDER — VANCOMYCIN HCL 1500 MG/300ML IV SOLN
1500.0000 mg | Freq: Once | INTRAVENOUS | Status: AC
  Administered 2023-03-09: 1500 mg via INTRAVENOUS
  Filled 2023-03-09: qty 300

## 2023-03-09 MED ORDER — DIPHENHYDRAMINE HCL 50 MG/ML IJ SOLN: 25.0000 mg | Freq: Three times a day (TID) | INTRAMUSCULAR | Status: DC | PRN

## 2023-03-09 MED ORDER — ENOXAPARIN SODIUM 40 MG/0.4ML IJ SOSY
40.0000 mg | PREFILLED_SYRINGE | INTRAMUSCULAR | Status: DC
  Administered 2023-03-10: 40 mg via SUBCUTANEOUS
  Filled 2023-03-09 (×3): qty 0.4

## 2023-03-09 MED ORDER — SODIUM CHLORIDE 0.9 % IV SOLN
1.0000 g | Freq: Once | INTRAVENOUS | Status: AC
  Administered 2023-03-09: 1 g via INTRAVENOUS
  Filled 2023-03-09: qty 10

## 2023-03-09 MED ORDER — HYDROXYZINE HCL 25 MG PO TABS
25.0000 mg | ORAL_TABLET | Freq: Four times a day (QID) | ORAL | Status: DC | PRN
  Administered 2023-03-12: 25 mg via ORAL
  Filled 2023-03-09: qty 1

## 2023-03-09 MED ORDER — CLONAZEPAM 1 MG PO TABS
1.0000 mg | ORAL_TABLET | Freq: Three times a day (TID) | ORAL | Status: DC
  Administered 2023-03-09: 1 mg via ORAL
  Filled 2023-03-09 (×2): qty 1

## 2023-03-09 MED ORDER — UMECLIDINIUM BROMIDE 62.5 MCG/ACT IN AEPB
1.0000 | INHALATION_SPRAY | Freq: Every day | RESPIRATORY_TRACT | Status: DC
  Administered 2023-03-10 – 2023-03-13 (×4): 1 via RESPIRATORY_TRACT
  Filled 2023-03-09: qty 7

## 2023-03-09 MED ORDER — LACTATED RINGERS IV BOLUS (SEPSIS)
1000.0000 mL | Freq: Once | INTRAVENOUS | Status: AC
  Administered 2023-03-09: 1000 mL via INTRAVENOUS

## 2023-03-09 MED ORDER — LAMOTRIGINE 25 MG PO TABS
25.0000 mg | ORAL_TABLET | Freq: Every day | ORAL | Status: DC
  Administered 2023-03-10 – 2023-03-13 (×4): 25 mg via ORAL
  Filled 2023-03-09 (×4): qty 1

## 2023-03-09 MED ORDER — HEPARIN SOD (PORK) LOCK FLUSH 100 UNIT/ML IV SOLN
INTRAVENOUS | Status: AC
  Administered 2023-03-09: 500 [IU]
  Filled 2023-03-09: qty 5

## 2023-03-09 MED ORDER — ACETAMINOPHEN 650 MG RE SUPP: 650.0000 mg | Freq: Four times a day (QID) | RECTAL | Status: DC | PRN

## 2023-03-09 MED ORDER — HYDROCORTISONE 10 MG PO TABS: 10.0000 mg | ORAL_TABLET | ORAL | Status: DC

## 2023-03-09 MED ORDER — ASPIRIN 81 MG PO TBEC
81.0000 mg | DELAYED_RELEASE_TABLET | Freq: Every day | ORAL | Status: DC
  Administered 2023-03-09 – 2023-03-12 (×4): 81 mg via ORAL
  Filled 2023-03-09 (×4): qty 1

## 2023-03-09 MED ORDER — POTASSIUM CHLORIDE 10 MEQ/100ML IV SOLN
10.0000 meq | INTRAVENOUS | Status: AC
  Administered 2023-03-09 – 2023-03-10 (×2): 10 meq via INTRAVENOUS
  Filled 2023-03-09 (×2): qty 100

## 2023-03-09 MED ORDER — LORAZEPAM 2 MG/ML IJ SOLN
0.5000 mg | Freq: Three times a day (TID) | INTRAMUSCULAR | Status: DC | PRN
  Administered 2023-03-09 – 2023-03-10 (×2): 0.5 mg via INTRAVENOUS
  Filled 2023-03-09 (×2): qty 1

## 2023-03-09 NOTE — Assessment & Plan Note (Addendum)
No increased wheezing or signs of exacerbation at time of admit Continue with scheduled breathing treatments: duonebs Continue qvar/spiriva and advair  SABA prn

## 2023-03-09 NOTE — H&P (Signed)
History and Physical    Patient: [name] NFA:[id number] DOB: 1971-07-16 DOA: 03/09/2023 DOS: the patient was seen and examined on 03/09/2023 PCP: Toppin, Lillia Dallas, MD  Patient coming from: Home - lives with her husband and son. Needs walker at times.    Chief Complaint: fever/cough/SOB  HPI: [name] is a 52 y.o. 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 Last night she started to have increased shortness of breath. She did 2 duonebs last night and then woke up to a fever of 104. She states her sats were in the mid 80s at home on her 5L oxygen and slowly was able to get this up with her nebulizer treatments. She was taking advil and tylenol this morning and was able to get it down to 100. She also complains of productive cough that is new that started yesterday evening. She has chronic nausea, an episode of vomiting this AM. Poor PO intake x 10 months.   Denies vision changes/headaches, chest pain, none abdominal pain, diarrhea, or leg swelling.    She does not smoke or drink alcohol.   ER Course:  vitals: temp: 99.2, bp: 123/69, HR: 111, RR: 20, oxygen: 98%RA Pertinent labs: wbc: 20.1, hgb: 9.0, covid/flu/rsv negative, potassium: 3.2, BUN: 31, creatinine: 1.26,  CXR: developing opacities along lung bases,left greater than right. Possible infiltrate.  In ED: started on cefepime and vancomycin, given 100mg  of hydrocortisone, 1L IVF, BC obtained and [facility] asked to admit.   Review of Systems: As mentioned in the  history of present illness. All other systems reviewed and are negative. Past Medical History:  Diagnosis Date   Addison's disease (HCC)    Adrenal insufficiency (HCC)    Anemia    Anxiety    Aortic stenosis    Aortic stenosis    Appendicitis 12/19/09   Appendicitis    Breast cancer (HCC)    STATUS POST BILATERAL MASTECTOMY. STATUS POST RECONSTRUCTION. SHE HAD SILICONE BREAST IMPLANTS AND THE LEFT IMPLANT IS LEAKING SLIGHTLY   Cellulitis of right middle finger 11/07/2018   Cervical cancer (HCC) 12/23/2018   Chest pain    CHF with right heart failure (HCC) 04/17/2017   Chronic respiratory failure with hypoxia (HCC) 12/23/2018   Cough variant asthma 04/13/2019   Depression    GERD (gastroesophageal reflux disease)    takes Dexilant and carafate and gi coctail    Headache    migraines on a daily and monthly regimen    Heart murmur    History of kidney stones    Hodgkin lymphoma (HCC)    STATUS POST MANTLE RADIATION   Hodgkin's lymphoma (HCC)    1987   Hypertension    Hypoxia    Necrotizing fasciitis (HCC) 12/23/2018   Non-ischemic cardiomyopathy (HCC)    Osteoporosis    Palpitations    Pituitary adenoma (HCC) 12/23/2018   Pneumonia    PONV (postoperative nausea and vomiting)    Pre-diabetes    per pt; no meds   Pulmonary hypertension (HCC) 12/23/2018   Raynaud phenomenon    Right  heart failure (HCC) 04/17/2017   Seizures (HCC)    last febrile seizure was approx 3 weeks ago per report on 12/01/2020   Sleep apnea    upcoming sleep study per pt    Supplemental oxygen dependent    3 liters   SVT (supraventricular tachycardia)    Tachycardia    Thyroid cancer (HCC)    STATUS POST SURGICAL REMOVAL-CURRENT ON THYROID REPLACEMENT   Past Surgical History:  Procedure Laterality Date   ABDOMINAL HYSTERECTOMY     AMPUTATION Left 01/30/2019   Procedure: Left Index finger amputation with flap reconstruction and repair reconstruction;  Surgeon: Dominica Severin, MD;  Location: [facility] OR;   Service: Orthopedics;  Laterality: Left;   APPENDECTOMY     breast implants and removal      breast implants but leaking      CARDIAC CATHETERIZATION  05/18/09   NORMAL CATH   COLONOSCOPY     hx of chemotherapy      hx of radiation therapy      I & D EXTREMITY Left 12/23/2018   Procedure: IRRIGATION AND DEBRIDEMENT HAND / INDEX FINGER;  Surgeon: Dominica Severin, MD;  Location: [facility] OR;  Service: Orthopedics;  Laterality: Left;   IR CV LINE INJECTION  03/22/2022   IR FLUORO GUIDE CV LINE RIGHT  10/09/2022   IR FLUORO GUIDE CV LINE RIGHT  01/18/2023   IR IMAGING GUIDED PORT INSERTION  05/06/2020   IR IMAGING GUIDED PORT INSERTION  12/04/2021   IR REMOVAL TUN ACCESS W/ PORT W/O FL MOD SED  04/27/2020   IR REMOVAL TUN ACCESS W/ PORT W/O FL MOD SED  12/04/2021   IR REMOVAL TUN ACCESS W/ PORT W/O FL MOD SED  03/30/2022   IR US GUIDE VASC ACCESS RIGHT  10/09/2022   KIDNEY STONE SURGERY     LUMBAR PUNCTURE W/ INTRATHECAL CHEMOTHERAPY     MASTECTOMY     PITUITARY SURGERY     RIGHT/LEFT HEART CATH AND CORONARY ANGIOGRAPHY N/A 04/02/2018   Procedure: RIGHT/LEFT HEART CATH AND CORONARY ANGIOGRAPHY;  Surgeon: Kathleene Hazel, MD;  Location: [facility] INVASIVE CV [facility];  Service: Cardiovascular;  Laterality: N/A;   RIGHT/LEFT HEART CATH AND CORONARY ANGIOGRAPHY N/A 08/31/2022   Procedure: RIGHT/LEFT HEART CATH AND CORONARY ANGIOGRAPHY;  Surgeon: Dolores Patty, MD;  Location: [facility];  Service: Cardiovascular;  Laterality: N/A;   TOOTH EXTRACTION N/A 12/05/2020   Procedure: DENTAL RESTORATION/EXTRACTIONS;  Surgeon: Lovena Neighbours, MD;  Location: [facility] ORS;  Service: Oral Surgery;  Laterality: N/A;   TOTAL THYROIDECTOMY     VIDEO BRONCHOSCOPY Bilateral 11/14/2018   Procedure: VIDEO BRONCHOSCOPY WITHOUT FLUORO;  Surgeon: Luciano Cutter, MD;  Location[facility] ENDOSCOPY;  Service: Cardiopulmonary;  Laterality: Bilateral;   VIDEO BRONCHOSCOPY WITH ENDOBRONCHIAL ULTRASOUND N/A 11/19/2018   Procedure: VIDEO  BRONCHOSCOPY WITH ENDOBRONCHIAL ULTRASOUND;  Surgeon: Luciano Cutter, MD;  Location: [facility] OR;  Service: Thoracic;  Laterality: N/A;   Social History:  reports that she has never smoked. She has never used smokeless tobacco. She reports that she does not currently use alcohol. She reports that she does not use drugs.  Allergies  Allergen Reactions   Ferrous Bisglycinate Chelate [Iron] Anaphylaxis and Other (See Comments)    Only IV - only FERRLICET   Mushroom Extract Complex Anaphylaxis   Na Ferric Gluc Cplx In Sucrose Anaphylaxis   Cymbalta [Duloxetine Hcl] Swelling and Anxiety   Hydromorphone Other (See Comments)    BP drop and heart rate drops  5.1.20 PT REPORTS THAT SHE TAKES DILAUDID AT HOME   Ondansetron Hcl Other (See Comments)   Promethazine Other (See Comments)    Other reaction(s): Unknown   Succinylcholine Other (See Comments)    Lock Jaw   Buprenorphine Hcl Hives   Compazine Other (See Comments)    Altered mental status Aggression   Duloxetine     Other reaction(s): Not available   Metoclopramide Other (See Comments)    Dystonia   Morphine And Codeine Hives   Ondansetron Hives and Rash    Other reaction(s): Unknown Other reaction(s): Unknown   Promethazine Hcl Hives   Tegaderm Ag Mesh [Silver] Rash    Old formulation only, is able to tolerate new formulation    Family History  Family history unknown: Yes    Prior to Admission medications   Medication Sig Start Date End Date Taking? Authorizing Provider  albuterol (PROVENTIL) (2.5 MG/3ML) 0.083% nebulizer solution Take 3 mLs (2.5 mg total) by nebulization every 6 (six) hours as needed for wheezing or shortness of breath. 07/09/22 01/22/23  Pokhrel, Rebekah Chesterfield, MD  aspirin EC 81 MG tablet Take 1 tablet (81 mg total) by mouth daily. Swallow whole. Patient taking differently: Take 81 mg by mouth at bedtime. Chewable 07/16/22   Nahser, Deloris Ping, MD  Bismuth Tribromoph-Petrolatum (XEROFORM OCCLUSIVE GAUZE STRIP) PADS Apply  1 each topically as directed. 09/17/22   Rolly Salter, MD  bumetanide (BUMEX) 1 MG tablet TAKE 1 TABLET BY MOUTH TWICE A DAY Patient taking differently: Take 1 mg by mouth 2 (two) times daily. 11/20/22   Nahser, Deloris Ping, MD  buPROPion (WELLBUTRIN XL) 150 MG 24 hr tablet TAKE 1 TABLET BY MOUTH EVERY DAY 02/07/23   Mozingo, Thereasa Solo, NP  clonazePAM (KLONOPIN) 1 MG tablet Take 1 tablet (1 mg total) by mouth 2 (two) times daily. 02/15/23   Mozingo, Thereasa Solo, NP  dexlansoprazole (DEXILANT) 60 MG capsule Take 60 mg by mouth daily.    [provider]  Diclofenac Potassium,Migraine, (CAMBIA) 50 MG PACK Take 50 mg by mouth daily as needed for migraine. 05/16/21   [provider]  diphenhydrAMINE (BENADRYL) 12.5 MG/5ML elixir Take 10 mLs (25 mg total) by mouth every 6 (six) hours as needed (nausea). 09/17/22   Rolly Salter, MD  dronabinol (MARINOL) 2.5 MG capsule TAKE 1 CAPSULE BY MOUTH TWICE A DAY AS NEEDED NAUSEA Patient taking differently: Take 2.5 mg by mouth at bedtime. 09/10/22   Serena Croissant, MD  EMGALITY 120 MG/ML SOSY Inject 120 mg into the skin every 28 (twenty-eight) days.    [provider]  EPINEPHRINE 0.3 mg/0.3 mL IJ SOAJ injection INJECT 0.3 MG INTO THE MUSCLE AS NEEDED FOR ANAPHYLAXIS. 01/21/23   Luciano Cutter, MD  escitalopram (LEXAPRO) 20 MG tablet TAKE 1 TABLET BY MOUTH EVERY DAY IN THE EVENING 03/08/23   Mozingo, Thereasa Solo, NP  FLORASTOR 250 MG capsule Take 250 mg by mouth See admin instructions. Take 250 mg by mouth mid-morning and mid-afternoon (1600)    [provider]  fluticasone-salmeterol (ADVAIR HFA) 230-21 MCG/ACT inhaler INHALE 2 PUFFS INTO THE LUNGS TWICE A DAY Patient taking differently: Inhale 2 puffs into the lungs 2 (two) times daily. 08/15/22   Luciano Cutter, MD  guaiFENesin (ROBITUSSIN) 100 MG/5ML liquid Take 15 mLs by mouth every 6 (six) hours as needed for cough or to loosen phlegm. 07/09/22   Pokhrel,  Rebekah Chesterfield, MD  Heparin Na, Pork, Lock Flsh PF (BD HEPARIN POSIFLUSH) 100 UNIT/ML  SOLN USE 5 MLS IN PORT A CATH ONCE DAILY AFTER MEDICATION ADMINISTRATION AS A HEPLOCK AS DIRECTED. 03/05/23     hydrocortisone (CORTEF) 10 MG tablet Take 1-2 tablets (10-20 mg total) by mouth See admin instructions. Take 20 mg in the am and 10mg  in the evening. Take after completing prednisone 09/29/22   Rolly Salter, MD  HYDROmorphone (DILAUDID) 4 MG tablet Take 1 tablet (4 mg total) by mouth every 4 (four) hours as needed. 12/28/22     HYDROmorphone (DILAUDID) 4 MG tablet Take 1 tablet (4 mg total) by mouth every 4 (four) hours, as needed 01/11/23     hydrOXYzine (ATARAX) 25 MG tablet Take 1 tablet (25 mg total) by mouth every 6 (six) hours as needed for anxiety. 01/24/23   Azucena Fallen, MD  lamoTRIgine (LAMICTAL) 200 MG tablet TAKE 1 TABLET BY MOUTH EVERYDAY AT BEDTIME Patient taking differently: Take 200 mg by mouth at bedtime. 12/24/22   Mozingo, Thereasa Solo, NP  lamoTRIgine (LAMICTAL) 25 MG tablet Take 1 tablet (25 mg total) by mouth daily. 03/04/23   Mozingo, Thereasa Solo, NP  Lancets [facility] DELICA PLUS [place]) MISC 3 (three) times daily. for testing 06/12/21   [provider]  LORazepam (ATIVAN) 0.5 MG tablet Take 1 tablet (0.5 mg total) by mouth 3 (three) times daily as needed for anxiety. 02/11/23   Mozingo, Thereasa Solo, NP  menthol-cetylpyridinium (CEPACOL) 3 MG lozenge Take 1 lozenge (3 mg total) by mouth as needed for sore throat. 09/17/22   Rolly Salter, MD  nitroGLYCERIN (NITROSTAT) 0.4 MG SL tablet Dissolve 1 tablet under the tongue every 5 minutes as needed for chest pain. Max of 3 doses, then 911. 08/20/22   Nahser, Deloris Ping, MD  [facility] VERIO test strip 3 (three) times daily. for testing 06/12/21   [provider]  OXYGEN Inhale 4-5 L/min into the lungs continuous.    [provider]  PROAIR HFA [phone] Base) MCG/ACT inhaler Inhale 2 puffs into the lungs  every 4 (four) hours as needed for wheezing or shortness of breath.    [provider]  QVAR REDIHALER 80 MCG/ACT inhaler INHALE 1 PUFF INTO THE LUNGS TWICE A DAY Patient taking differently: Inhale 1 puff into the lungs 2 (two) times daily. 01/21/23   Luciano Cutter, MD  Rimegepant Sulfate (NURTEC) 75 MG TBDP Take 75 mg by mouth daily as needed (Migraine).     [provider]  rosuvastatin (CRESTOR) 10 MG tablet Take 10 mg by mouth daily. Mid morning 02/28/18   [provider]  sacubitril-valsartan (ENTRESTO) 24-26 MG Take 1 tablet by mouth 2 (two) times daily. 09/22/22   Rolly Salter, MD  silver sulfADIAZINE (SILVADENE) 1 % cream Apply to affected area daily 09/17/22 09/17/23  Rolly Salter, MD  sitaGLIPtin (JANUVIA) 100 MG tablet Take 1 tablet (100 mg total) by mouth daily. 10/19/21   Serena Croissant, MD  Sodium Chloride Flush (NORMAL SALINE FLUSH) 0.9 % SOLN Use as directed for central venous catheter maintenance 03/05/23     Sodium Chloride Flush (NORMAL SALINE FLUSH) 0.9 % SOLN Use as directed for central venous catheter maintenance 01/31/23     sucralfate (CARAFATE) 1 GM/10ML suspension Take 1 g by mouth daily as needed (as directed for ulcers).    [provider]  SYNTHROID 100 MCG tablet Take 1 tablet (100 mcg total) by mouth every morning. Patient taking differently: Take 100 mcg by mouth every morning. * Must be name  brand* 10/19/21   Serena Croissant, MD  Tiotropium Bromide Monohydrate (SPIRIVA RESPIMAT) 1.25 MCG/ACT AERS Inhale 2 puffs into the lungs daily. 08/15/22   Luciano Cutter, MD  topiramate (TOPAMAX) 50 MG tablet Take 150 mg by mouth at bedtime.  12/25/19   [provider]  zolpidem (AMBIEN CR) 12.5 MG CR tablet Take 12.5 mg by mouth at bedtime. 03/05/22   [provider]    Physical Exam: Vitals:   03/09/23 1516 03/09/23 1800 03/09/23 1900 03/09/23 2120  BP:  105/65 (!) 101/59 127/82  Pulse:  89 91 (!) 106  Resp:  17 17 18    Temp:    98.9 F (37.2 C)  TempSrc:    Oral  SpO2:  99% 96% 100%  Weight: 78 kg     Height: 5\' 5"  (1.651 m)      General:  Appears calm and comfortable and is in NAD. Tearful at times  Eyes:  PERRL, EOMI, normal lids, iris ENT:  grossly normal hearing, lips & tongue, dry mucous membranes; appropriate dentition Neck:  no LAD, masses or thyromegaly; no carotid bruits, no JVD Cardiovascular:  RRR, no m/r/g. No LE edema.  Respiratory:   crackles in LLL.  Normal respiratory effort. No wheezing. IJ cath in right chest wall  Abdomen:  soft, NT, ND, NABS Back:   normal alignment, no CVAT Skin:  no rash or induration seen on limited exam. Lymphedema of upper arms  Musculoskeletal:  grossly normal tone BUE/BLE, good ROM, no bony abnormality Lower extremity:  No LE edema.  Limited foot exam with no ulcerations.  2+ distal pulses. Psychiatric:  grossly normal mood and affect, speech fluent and appropriate, AOx3 Neurologic:  CN 2-12 grossly intact, moves all extremities in coordinated fashion, sensation intact   Radiological Exams on Admission: Independently reviewed - see discussion in A/P where applicable  DG Chest 2 View  Result Date: 03/09/2023 CLINICAL DATA:  Sepsis EXAM: CHEST - 2 VIEW COMPARISON:  X-ray 01/22/2023.  CT scan 01/23/2023 FINDINGS: Stable right IJ line with tip overlying the upper right atrium near the SVC margin. Bilateral axillary surgical clips. Underinflation. No pneumothorax or effusion. Developing ill-defined parenchymal interstitial opacities along the lung bases, left-greater-than-right. No pneumothorax or effusion. Stable cardiopericardial silhouette with a calcified aorta. IMPRESSION: Developing opacities along the lung bases, left-greater-than-right. Possible infiltrate. Recommend follow-up. Electronically Signed   By: Karen Kays M.D.   On: 03/09/2023 16:34    EKG: Independently reviewed.  Sinus tachycardia with rate 109; nonspecific ST changes with no evidence of  acute ischemia   Labs on Admission: I have personally reviewed the available labs and imaging studies at the time of the admission.  Pertinent labs:   wbc: 20.1 hgb: 9.0, covid/flu/rsv negative,  potassium: 3.2,  BUN: 31,  creatinine: 1.26,  Assessment and Plan: Principal Problem:   Sepsis due to pneumonia [facility]) Active Problems:   Hypokalemia   Microcytic anemia   Chronic nausea   Severe persistent asthma   Grade I diastolic dysfunction   Addison's disease (HCC)   Chronic respiratory failure with hypoxia (HCC)   Right heart failure (HCC)   History of thyroid cancer s/p thyroidectomy and post surgical hypothyroidism   Hyperlipidemia   GERD (gastroesophageal reflux disease)   Generalized anxiety disorder/depression   CKD stage 3a, GFR 45-59 ml/min (HCC)    Assessment and Plan: * Sepsis due to pneumonia [facility]) 52 year old female presenting with 2 day history of worsening shortness of breath, fever and productive cough  found to be septic on arrival with findings on CXR and clinical presentation consistent with pneumonia.  -obs to med-surg -continue cefepime/vanc with multiple hospitalizations and chronic steroid use  -no recorded fever since arrival to ED  -check urinary antigens -BC pending -lactic acid wnl -trend PCT  -sputum culture obtained -no hypoxia, on her home 5L oxygen  -continue IVF x 12 hours  -leukocytosis, but on chronic hydrocortisone  -covid/flu/RSV negative. Check RVP  -scheduled duonebs    Hypokalemia Check magnesium Replete and trend   Microcytic anemia Hgb at baseline Iron studies pending   Chronic nausea Multiple allergies Continue IV ativan and benadryl PRN for nausea   Severe persistent asthma No increased wheezing or signs of exacerbation at time of admit Continue with scheduled breathing treatments: duonebs Continue qvar/spiriva and advair  SABA prn   Grade I diastolic dysfunction Dry on exam Recent echo in showed normal EF  with grade 1 DD and normal RV function  -hold entresto/bumex  -Strict I/O/daily weights   Addison's disease (HCC) Received 100mg  of hydrocortisone in ED Blood pressure has been stable. No indication for stress dosing  Continue hydrocortisone   Chronic respiratory failure with hypoxia (HCC) Stable on home 5L   Right heart failure (HCC) Recent echo in showed normal EF with grade 1 DD and normal RV function  No signs of volume overload, more dry on exam Monitor I/O  Hold entresto with soft bp. Hold bumex today since appears dry   History of thyroid cancer s/p thyroidectomy and post surgical hypothyroidism Repeat TSH pending. Has been >1 year  Continue synthroid   Hyperlipidemia Continue crestor 10mg  daily   GERD (gastroesophageal reflux disease) Continue dexilant, carafate PRN  Generalized anxiety disorder/depression Continue hydroxyzine, ativan and wellbutrin  Continue lexapro   CKD stage 3a, GFR 45-59 ml/min (HCC) At baseline, continue to monitor     Advance Care Planning:   Code Status: DNR   Consults: none   DVT Prophylaxis: lovenox   Family Communication: none   Severity of Illness: The appropriate patient status for this patient is OBSERVATION. Observation status is judged to be reasonable and necessary in order to provide the required intensity of service to ensure the patient's safety. The patient's presenting symptoms, physical exam findings, and initial radiographic and laboratory data in the context of their medical condition is felt to place them at decreased risk for further clinical deterioration. Furthermore, it is anticipated that the patient will be medically stable for discharge from the hospital within 2 midnights of admission.   Author: Orland Mustard, MD 03/09/2023 9:52 PM  For on call review [email or web address].

## 2023-03-09 NOTE — Assessment & Plan Note (Signed)
At baseline, continue to monitor °

## 2023-03-09 NOTE — Progress Notes (Signed)
A consult was received from an ED physician for Vancomycin per pharmacy dosing.  The patient's profile has been reviewed for ht/wt/allergies/indication/available labs.    A one time order has been placed for:  Vancomycin 1.5gm x 1 dose.    Further antibiotics/pharmacy consults should be ordered by admitting physician if indicated.                       Thank you, Josefa Half 03/09/2023  6:18 PM

## 2023-03-09 NOTE — Assessment & Plan Note (Signed)
Received 100mg  of hydrocortisone in ED Blood pressure has been stable. No indication for stress dosing  Continue hydrocortisone

## 2023-03-09 NOTE — Assessment & Plan Note (Signed)
Continue hydroxyzine, ativan and wellbutrin  Continue lexapro

## 2023-03-09 NOTE — Assessment & Plan Note (Addendum)
Repeat TSH pending. Has been >1 year  Continue synthroid

## 2023-03-09 NOTE — Assessment & Plan Note (Signed)
Stable on home 5 L. 

## 2023-03-09 NOTE — Assessment & Plan Note (Addendum)
52 year old female presenting with 2 day history of worsening shortness of breath, fever and productive cough found to be septic on arrival with findings on CXR and clinical presentation consistent with pneumonia.  -obs to med-surg -continue cefepime/vanc with multiple hospitalizations and chronic steroid use  -no recorded fever since arrival to ED  -check urinary antigens -BC pending -lactic acid wnl -trend PCT  -sputum culture obtained -no hypoxia, on her home 5L oxygen  -continue IVF x 12 hours  -leukocytosis, but on chronic hydrocortisone  -covid/flu/RSV negative. Check RVP  -scheduled duonebs

## 2023-03-09 NOTE — Assessment & Plan Note (Signed)
Hgb at baseline Iron studies pending

## 2023-03-09 NOTE — Assessment & Plan Note (Addendum)
Recent echo in showed normal EF with grade 1 DD and normal RV function  No signs of volume overload, more dry on exam Monitor I/O  Hold entresto with soft bp. Hold bumex today since appears dry

## 2023-03-09 NOTE — Assessment & Plan Note (Addendum)
Dry on exam Recent echo in showed normal EF with grade 1 DD and normal RV function  -hold entresto/bumex  -Strict I/O/daily weights

## 2023-03-09 NOTE — Assessment & Plan Note (Signed)
Continue dexilant, carafate PRN

## 2023-03-09 NOTE — Assessment & Plan Note (Signed)
Multiple allergies Continue IV ativan and benadryl PRN for nausea

## 2023-03-09 NOTE — ED Notes (Signed)
ED TO INPATIENT HANDOFF REPORT  ED Nurse Name and Phone #: Tia, RN  S Name/Age/Gender Clement Sayres 52 y.o. female Room/Bed: WA16/WA16  Code Status   Code Status: Prior  Home/SNF/Other Home Patient oriented to: self, place, time, and situation Is this baseline? Yes   Triage Complete: Triage complete  Chief Complaint Sepsis [facility]) [A41.9]  Triage Note Patient brought in from home by EMS for failure to thrive. Patient has HX of lung and heart failure. She is currently palliative care and was scheduled to see hospice next week. She has c/o fever at home. Per EMS 2 neb treatment given.  106/72 110 Port to R chest    Allergies Allergies  Allergen Reactions   Ferrous Bisglycinate Chelate [Iron] Anaphylaxis and Other (See Comments)    Only IV - only FERRLICET   Mushroom Extract Complex Anaphylaxis   Na Ferric Gluc Cplx In Sucrose Anaphylaxis   Cymbalta [Duloxetine Hcl] Swelling and Anxiety   Hydromorphone Other (See Comments)    BP drop and heart rate drops 5.1.20 PT REPORTS THAT SHE TAKES DILAUDID AT HOME   Ondansetron Hcl Other (See Comments)   Promethazine Other (See Comments)    Other reaction(s): Unknown   Succinylcholine Other (See Comments)    Lock Jaw   Buprenorphine Hcl Hives   Compazine Other (See Comments)    Altered mental status Aggression   Duloxetine     Other reaction(s): Not available   Metoclopramide Other (See Comments)    Dystonia   Morphine And Codeine Hives   Ondansetron Hives and Rash    Other reaction(s): Unknown Other reaction(s): Unknown   Promethazine Hcl Hives   Tegaderm Ag Mesh [Silver] Rash    Old formulation only, is able to tolerate new formulation    Level of Care/Admitting Diagnosis ED Disposition     ED Disposition  Admit   Condition  --   Comment  Hospital Area: [facility] [[id number]]  Level of Care: Telemetry [5]  Admit to tele based on following criteria: Other see comments  Comments: sepsis  May  place patient in observation at [facility] or [facility] if equivalent level of care is available:: Yes  Covid Evaluation: Confirmed COVID Negative  Diagnosis: Sepsis [facility]) [[id number]]  Admitting Physician: Orland Mustard [[id number]]  Attending Physician: Alton Revere          B Medical/Surgery History Past Medical History:  Diagnosis Date   Addison's disease (HCC)    Adrenal insufficiency (HCC)    Anemia    Anxiety    Aortic stenosis    Aortic stenosis    Appendicitis 12/19/09   Appendicitis    Breast cancer (HCC)    STATUS POST BILATERAL MASTECTOMY. STATUS POST RECONSTRUCTION. SHE HAD SILICONE BREAST IMPLANTS AND THE LEFT IMPLANT IS LEAKING SLIGHTLY   Cellulitis of right middle finger 11/07/2018   Cervical cancer (HCC) 12/23/2018   Chest pain    CHF with right heart failure (HCC) 04/17/2017   Chronic respiratory failure with hypoxia (HCC) 12/23/2018   Cough variant asthma 04/13/2019   Depression    GERD (gastroesophageal reflux disease)    takes Dexilant and carafate and gi coctail    Headache    migraines on a daily and monthly regimen    Heart murmur    History of kidney stones    Hodgkin lymphoma (HCC)    STATUS POST MANTLE RADIATION   Hodgkin's lymphoma (HCC)    1987   Hypertension    Hypoxia  Necrotizing fasciitis (HCC) 12/23/2018   Non-ischemic cardiomyopathy (HCC)    Osteoporosis    Palpitations    Pituitary adenoma (HCC) 12/23/2018   Pneumonia    PONV (postoperative nausea and vomiting)    Pre-diabetes    per pt; no meds   Pulmonary hypertension (HCC) 12/23/2018   Raynaud phenomenon    Right heart failure (HCC) 04/17/2017   Seizures (HCC)    last febrile seizure was approx 3 weeks ago per report on 12/01/2020   Sleep apnea    upcoming sleep study per pt    Supplemental oxygen dependent    3 liters   SVT (supraventricular tachycardia)    Tachycardia    Thyroid cancer (HCC)    STATUS POST SURGICAL REMOVAL-CURRENT ON THYROID REPLACEMENT   Past  Surgical History:  Procedure Laterality Date   ABDOMINAL HYSTERECTOMY     AMPUTATION Left 01/30/2019   Procedure: Left Index finger amputation with flap reconstruction and repair reconstruction;  Surgeon: Dominica Severin, MD;  Location: [facility] OR;  Service: Orthopedics;  Laterality: Left;   APPENDECTOMY     breast implants and removal      breast implants but leaking      CARDIAC CATHETERIZATION  05/18/09   NORMAL CATH   COLONOSCOPY     hx of chemotherapy      hx of radiation therapy      I & D EXTREMITY Left 12/23/2018   Procedure: IRRIGATION AND DEBRIDEMENT HAND / INDEX FINGER;  Surgeon: Dominica Severin, MD;  Location: [facility] OR;  Service: Orthopedics;  Laterality: Left;   IR CV LINE INJECTION  03/22/2022   IR FLUORO GUIDE CV LINE RIGHT  10/09/2022   IR FLUORO GUIDE CV LINE RIGHT  01/18/2023   IR IMAGING GUIDED PORT INSERTION  05/06/2020   IR IMAGING GUIDED PORT INSERTION  12/04/2021   IR REMOVAL TUN ACCESS W/ PORT W/O FL MOD SED  04/27/2020   IR REMOVAL TUN ACCESS W/ PORT W/O FL MOD SED  12/04/2021   IR REMOVAL TUN ACCESS W/ PORT W/O FL MOD SED  03/30/2022   IR US GUIDE VASC ACCESS RIGHT  10/09/2022   KIDNEY STONE SURGERY     LUMBAR PUNCTURE W/ INTRATHECAL CHEMOTHERAPY     MASTECTOMY     PITUITARY SURGERY     RIGHT/LEFT HEART CATH AND CORONARY ANGIOGRAPHY N/A 04/02/2018   Procedure: RIGHT/LEFT HEART CATH AND CORONARY ANGIOGRAPHY;  Surgeon: Kathleene Hazel, MD;  Location: [facility] INVASIVE CV LAB;  Service: Cardiovascular;  Laterality: N/A;   RIGHT/LEFT HEART CATH AND CORONARY ANGIOGRAPHY N/A 08/31/2022   Procedure: RIGHT/LEFT HEART CATH AND CORONARY ANGIOGRAPHY;  Surgeon: Dolores Patty, MD;  Location: [facility] INVASIVE CV LAB;  Service: Cardiovascular;  Laterality: N/A;   TOOTH EXTRACTION N/A 12/05/2020   Procedure: DENTAL RESTORATION/EXTRACTIONS;  Surgeon: Lovena Neighbours, MD;  Location: [facility] ORS;  Service: Oral Surgery;  Laterality: N/A;   TOTAL THYROIDECTOMY     VIDEO BRONCHOSCOPY Bilateral  11/14/2018   Procedure: VIDEO BRONCHOSCOPY WITHOUT FLUORO;  Surgeon: Luciano Cutter, MD;  Location: [facility] ENDOSCOPY;  Service: Cardiopulmonary;  Laterality: Bilateral;   VIDEO BRONCHOSCOPY WITH ENDOBRONCHIAL ULTRASOUND N/A 11/19/2018   Procedure: VIDEO BRONCHOSCOPY WITH ENDOBRONCHIAL ULTRASOUND;  Surgeon: Luciano Cutter, MD;  Location[facility] OR;  Service: Thoracic;  Laterality: N/A;     A IV Location/Drains/Wounds Patient Lines/Drains/Airways Status     Active Line/Drains/Airways     Name Placement date Placement time Site Days   Tunneled CVC Single Lumen (Radiology) 01/18/23 Right  Internal jugular 19 cm 0 cm 01/18/23  0901  -- 50            Intake/Output Last 24 hours  Intake/Output Summary (Last 24 hours) at 03/09/2023 1837 Last data filed at 03/09/2023 1818 Gross per 24 hour  Intake 1409.99 ml  Output --  Net 1409.99 ml    Labs/Imaging Results for orders placed or performed during the hospital encounter of 03/09/23 (from the past 48 hour(s))  Resp panel by RT-PCR (RSV, Flu A&B, Covid) Anterior Nasal Swab     Status: None   Collection Time: 03/09/23  3:42 PM   Specimen: Anterior Nasal Swab  Result Value Ref Range   SARS Coronavirus 2 by RT PCR NEGATIVE NEGATIVE    Comment: (NOTE) SARS-CoV-2 target nucleic acids are NOT DETECTED.  The SARS-CoV-2 RNA is generally detectable in upper respiratory specimens during the acute phase of infection. The lowest concentration of SARS-CoV-2 viral copies this assay can detect is 138 copies/mL. A negative result does not preclude SARS-Cov-2 infection and should not be used as the sole basis for treatment or other patient management decisions. A negative result may occur with  improper specimen collection/handling, submission of specimen other than nasopharyngeal swab, presence of viral mutation(s) within the areas targeted by this assay, and inadequate number of viral copies(<138 copies/mL). A negative result must be combined  with clinical observations, patient history, and epidemiological information. The expected result is Negative.  Fact Sheet for Patients:  BloggerCourse.com  Fact Sheet for Healthcare Providers:  SeriousBroker.it  This test is no t yet approved or cleared by the [place] FDA and  has been authorized for detection and/or diagnosis of SARS-CoV-2 by FDA under an Emergency Use Authorization (EUA). This EUA will remain  in effect (meaning this test can be used) for the duration of the COVID-19 declaration under Section 564(b)(1) of the Act, 21 U.S.C.section 360bbb-3(b)(1), unless the authorization is terminated  or revoked sooner.       Influenza A by PCR NEGATIVE NEGATIVE   Influenza B by PCR NEGATIVE NEGATIVE    Comment: (NOTE) The Xpert Xpress SARS-CoV-2/FLU/RSV plus assay is intended as an aid in the diagnosis of influenza from Nasopharyngeal swab specimens and should not be used as a sole basis for treatment. Nasal washings and aspirates are unacceptable for Xpert Xpress SARS-CoV-2/FLU/RSV testing.  Fact Sheet for Patients: BloggerCourse.com  Fact Sheet for Healthcare Providers: SeriousBroker.it  This test is not yet approved or cleared by the [place] FDA and has been authorized for detection and/or diagnosis of SARS-CoV-2 by FDA under an Emergency Use Authorization (EUA). This EUA will remain in effect (meaning this test can be used) for the duration of the COVID-19 declaration under Section 564(b)(1) of the Act, 21 U.S.C. section 360bbb-3(b)(1), unless the authorization is terminated or revoked.     Resp Syncytial Virus by PCR NEGATIVE NEGATIVE    Comment: (NOTE) Fact Sheet for Patients: BloggerCourse.com  Fact Sheet for Healthcare Providers: SeriousBroker.it  This test is not yet approved or cleared by the  [place] FDA and has been authorized for detection and/or diagnosis of SARS-CoV-2 by FDA under an Emergency Use Authorization (EUA). This EUA will remain in effect (meaning this test can be used) for the duration of the COVID-19 declaration under Section 564(b)(1) of the Act, 21 U.S.C. section 360bbb-3(b)(1), unless the authorization is terminated or revoked.  Performed at [facility], [address], [place], [place] [address]   Lactic acid, plasma  Status: None   Collection Time: 03/09/23  3:42 PM  Result Value Ref Range   Lactic Acid, Venous 1.0 0.5 - 1.9 mmol/L    Comment: Performed at [facility] [address], [place], [place] [address]  Comprehensive metabolic panel     Status: Abnormal   Collection Time: 03/09/23  3:42 PM  Result Value Ref Range   Sodium 135 135 - 145 mmol/L   Potassium 3.2 (L) 3.5 - 5.1 mmol/L   Chloride 99 98 - 111 mmol/L   CO2 24 22 - 32 mmol/L   Glucose, Bld 110 (H) 70 - 99 mg/dL    Comment: Glucose reference range applies only to samples taken after fasting for at least 8 hours.   BUN 31 (H) 6 - 20 mg/dL   Creatinine, Ser 3.23 (H) 0.44 - 1.00 mg/dL   Calcium 8.9 8.9 - 55.7 mg/dL   Total Protein 7.3 6.5 - 8.1 g/dL   Albumin 3.6 3.5 - 5.0 g/dL   AST 22 15 - 41 U/L   ALT 36 0 - 44 U/L   Alkaline Phosphatase 55 38 - 126 U/L   Total Bilirubin 0.4 0.3 - 1.2 mg/dL   GFR, Estimated 52 (L) >60 mL/min    Comment: (NOTE) Calculated using the CKD-EPI Creatinine Equation (2021)    Anion gap 12 5 - 15    Comment: Performed at [facility], [address], [place], [place] [address]  CBC with Differential     Status: Abnormal   Collection Time: 03/09/23  3:42 PM  Result Value Ref Range   WBC 20.1 (H) 4.0 - 10.5 K/uL   RBC 3.69 (L) 3.87 - 5.11 MIL/uL   Hemoglobin 9.0 (L) 12.0 - 15.0 g/dL   HCT 54.2 (L) 70.6 - 23.7 %   MCV 79.9 (L) 80.0 - 100.0 fL   MCH 24.4 (L) 26.0 - 34.0 pg   MCHC  30.5 30.0 - 36.0 g/dL   RDW 62.8 (H) 31.5 - 17.6 %   Platelets 380 150 - 400 K/uL   nRBC 0.0 0.0 - 0.2 %   Neutrophils Relative % 79 %   Neutro Abs 15.9 (H) 1.7 - 7.7 K/uL   Lymphocytes Relative 13 %   Lymphs Abs 2.6 0.7 - 4.0 K/uL   Monocytes Relative 5 %   Monocytes Absolute 1.1 (H) 0.1 - 1.0 K/uL   Eosinophils Relative 1 %   Eosinophils Absolute 0.1 0.0 - 0.5 K/uL   Basophils Relative 0 %   Basophils Absolute 0.1 0.0 - 0.1 K/uL   Immature Granulocytes 2 %   Abs Immature Granulocytes 0.33 (H) 0.00 - 0.07 K/uL    Comment: Performed at [facility], [address], [place], [place] [address]  Protime-INR     Status: None   Collection Time: 03/09/23  3:42 PM  Result Value Ref Range   Prothrombin Time 14.0 11.4 - 15.2 seconds   INR 1.1 0.8 - 1.2    Comment: (NOTE) INR goal varies based on device and disease states. Performed at [facility], [address], [place], [place] [address]   APTT     Status: Abnormal   Collection Time: 03/09/23  3:42 PM  Result Value Ref Range   aPTT 43 (H) 24 - 36 seconds    Comment:        IF BASELINE aPTT IS ELEVATED, SUGGEST PATIENT RISK ASSESSMENT BE USED TO DETERMINE APPROPRIATE ANTICOAGULANT THERAPY. Performed at [facility], [address] [place], [place] [address]    *Note: Due to a large number of results and/or encounters for the requested time period, some results have not been displayed. A complete set of results can be found in Results Review.   DG Chest 2 View  Result Date: 03/09/2023 CLINICAL DATA:  Sepsis EXAM: CHEST - 2 VIEW COMPARISON:  X-ray 01/22/2023.  CT scan 01/23/2023 FINDINGS: Stable right IJ line with tip overlying the upper right atrium near the SVC margin. Bilateral axillary surgical clips. Underinflation. No pneumothorax or effusion. Developing ill-defined parenchymal interstitial opacities along the lung bases, left-greater-than-right. No pneumothorax or  effusion. Stable cardiopericardial silhouette with a calcified aorta. IMPRESSION: Developing opacities along the lung bases, left-greater-than-right. Possible infiltrate. Recommend follow-up. Electronically Signed   By: Karen Kays M.D.   On: 03/09/2023 16:34    Pending Labs Unresulted Labs (From admission, onward)     Start     Ordered   03/09/23 1822  Procalcitonin  Daily,   R     References:    Procalcitonin Lower Respiratory Tract Infection AND Sepsis Procalcitonin Algorithm   03/09/23 1821   03/09/23 1822  Expectorated Sputum Assessment w Gram Stain, Rflx to Resp Cult  Once,   R        03/09/23 1821   03/09/23 1822  Legionella Pneumophila Serogp 1 Ur Ag  Once,   R        03/09/23 1821   03/09/23 1822  Strep pneumoniae urinary antigen  Once,   R        03/09/23 1821   03/09/23 1821  TSH  Once,   R        03/09/23 1820   03/09/23 1819  Ferritin  Once,   R        03/09/23 1818   03/09/23 1819  Iron and TIBC  Once,   R        03/09/23 1818   03/09/23 1819  Vitamin B12  Once,   R        03/09/23 1818   03/09/23 1543  Urinalysis, w/ Reflex to Culture (Infection Suspected) -Urine, Catheterized  (Septic presentation on arrival (screening labs, nursing and treatment orders for obvious sepsis))  Once,   URGENT       Question:  Specimen Source  Answer:  Urine, Catheterized   03/09/23 1543   03/09/23 1542  Lactic acid, plasma  (Septic presentation on arrival (screening labs, nursing and treatment orders for obvious sepsis))  STAT Now then every 2 hours,   R (with STAT occurrences)      03/09/23 1543   03/09/23 1542  Blood Culture (routine x 2)  (Septic presentation on arrival (screening labs, nursing and treatment orders for obvious sepsis))  BLOOD CULTURE X 2,   STAT      03/09/23 1543            Vitals/Pain Today's Vitals   03/09/23 1500 03/09/23 1515 03/09/23 1516 03/09/23 1800  BP: 123/69   105/65  Pulse: (!) 111   89  Resp: 20   17  Temp: 99.2 F (37.3 C)     TempSrc: Oral      SpO2: 98%   99%  Weight:   78 kg   Height:   5\' 5"  (1.651 m)   PainSc:  10-Worst pain ever      Isolation Precautions No active isolations  Medications Medications  ceFEPIme (MAXIPIME) 1 g in sodium chloride 0.9 % 100  mL IVPB (1 g Intravenous New Bag/Given 03/09/23 1835)  lactated ringers infusion (has no administration in time range)  vancomycin (VANCOREADY) IVPB 1500 mg/300 mL (has no administration in time range)  potassium chloride SA (KLOR-CON M) CR tablet 40 mEq (has no administration in time range)  lactated ringers bolus 1,000 mL (0 mLs Intravenous Stopped 03/09/23 1818)  diphenhydrAMINE (BENADRYL) injection 25 mg (25 mg Intravenous Given 03/09/23 1642)  heparin lock flush 100 UNIT/ML injection (500 Units  Given 03/09/23 1643)  hydrocortisone sodium succinate (SOLU-CORTEF) 100 MG injection 100 mg (100 mg Intravenous Given 03/09/23 1820)    Mobility walks     Focused Assessments Neuro Assessment Handoff:  Swallow screen pass?  N/A Cardiac Rhythm: Normal sinus rhythm       Neuro Assessment: Within Defined Limits Neuro Checks:      Has TPA been given? No If patient is a Neuro Trauma and patient is going to OR before floor call report to 4N Charge nurse: [phone] or [phone]   R Recommendations: See Admitting Provider Note  Report given to:   Additional Notes:

## 2023-03-09 NOTE — Sepsis Progress Note (Signed)
Sepsis protocol is being followed by [facility]. 

## 2023-03-09 NOTE — ED Provider Notes (Signed)
[place] EMERGENCY DEPARTMENT AT [facility] Provider Note   CSN: [id number] Arrival date & time: 03/09/23  1441     History  Chief Complaint  Patient presents with   Failure To Thrive    [name] is a 52 y.o. female.  The history is provided by the patient and medical records. No language interpreter was used.     52 year old female with multiple comorbidity with includes hypertension, CHF, oxygen lymphoma, thyroid cancer, aortic stenosis, depression, adrenal insufficiency brought here via EMS with concerns of sepsis.  Patient states that for the past year and a half she has had multiple hospital admission due to sepsis.  She endorsed having a fever as high as 104 this morning.  She felt bad since yesterday.  She endorsed persistent cough, and shortness of breath.  She have not urinate for the past 2 days.  She feels depressed and felt that she is likely having sepsis again.  She endorsed nausea without vomiting.  She endorsed global weakness.  She is DNR/DNI and is starting hospice care.  She wears home supplemental oxygen at 5 L.  She tries alternate between Tylenol and ibuprofen for symptoms at home as well as using her DuoNebs and breathing treatment without relief.  Home Medications Prior to Admission medications   Medication Sig Start Date End Date Taking? Authorizing Provider  albuterol (PROVENTIL) (2.5 MG/3ML) 0.083% nebulizer solution Take 3 mLs (2.5 mg total) by nebulization every 6 (six) hours as needed for wheezing or shortness of breath. 07/09/22 01/22/23  Pokhrel, Rebekah Chesterfield, MD  aspirin EC 81 MG tablet Take 1 tablet (81 mg total) by mouth daily. Swallow whole. Patient taking differently: Take 81 mg by mouth at bedtime. Chewable 07/16/22   Nahser, Deloris Ping, MD  Bismuth Tribromoph-Petrolatum (XEROFORM OCCLUSIVE GAUZE STRIP) PADS Apply 1 each topically as directed. 09/17/22   Rolly Salter, MD  bumetanide (BUMEX) 1 MG tablet TAKE 1 TABLET BY MOUTH TWICE A  DAY Patient taking differently: Take 1 mg by mouth 2 (two) times daily. 11/20/22   Nahser, Deloris Ping, MD  buPROPion (WELLBUTRIN XL) 150 MG 24 hr tablet TAKE 1 TABLET BY MOUTH EVERY DAY 02/07/23   Mozingo, Thereasa Solo, NP  clonazePAM (KLONOPIN) 1 MG tablet Take 1 tablet (1 mg total) by mouth 2 (two) times daily. 02/15/23   Mozingo, Thereasa Solo, NP  dexlansoprazole (DEXILANT) 60 MG capsule Take 60 mg by mouth daily.    [provider]  Diclofenac Potassium,Migraine, (CAMBIA) 50 MG PACK Take 50 mg by mouth daily as needed for migraine. 05/16/21   [provider]  diphenhydrAMINE (BENADRYL) 12.5 MG/5ML elixir Take 10 mLs (25 mg total) by mouth every 6 (six) hours as needed (nausea). 09/17/22   Rolly Salter, MD  dronabinol (MARINOL) 2.5 MG capsule TAKE 1 CAPSULE BY MOUTH TWICE A DAY AS NEEDED NAUSEA Patient taking differently: Take 2.5 mg by mouth at bedtime. 09/10/22   Serena Croissant, MD  EMGALITY 120 MG/ML SOSY Inject 120 mg into the skin every 28 (twenty-eight) days.    [provider]  EPINEPHRINE 0.3 mg/0.3 mL IJ SOAJ injection INJECT 0.3 MG INTO THE MUSCLE AS NEEDED FOR ANAPHYLAXIS. 01/21/23   Luciano Cutter, MD  escitalopram (LEXAPRO) 20 MG tablet TAKE 1 TABLET BY MOUTH EVERY DAY IN THE EVENING 03/08/23   Mozingo, Thereasa Solo, NP  FLORASTOR 250 MG capsule Take 250 mg by mouth See admin instructions. Take 250 mg by mouth mid-morning and mid-afternoon (1600)  [provider]  fluticasone-salmeterol (ADVAIR HFA) 230-21 MCG/ACT inhaler INHALE 2 PUFFS INTO THE LUNGS TWICE A DAY Patient taking differently: Inhale 2 puffs into the lungs 2 (two) times daily. 08/15/22   Luciano Cutter, MD  guaiFENesin (ROBITUSSIN) 100 MG/5ML liquid Take 15 mLs by mouth every 6 (six) hours as needed for cough or to loosen phlegm. 07/09/22   Pokhrel, Rebekah Chesterfield, MD  Heparin Na, Pork, Lock Flsh PF (BD HEPARIN POSIFLUSH) 100 UNIT/ML SOLN USE 5 MLS IN PORT A CATH ONCE DAILY AFTER  MEDICATION ADMINISTRATION AS A HEPLOCK AS DIRECTED. 03/05/23     hydrocortisone (CORTEF) 10 MG tablet Take 1-2 tablets (10-20 mg total) by mouth See admin instructions. Take 20 mg in the am and 10mg  in the evening. Take after completing prednisone 09/29/22   Rolly Salter, MD  HYDROmorphone (DILAUDID) 4 MG tablet Take 1 tablet (4 mg total) by mouth every 4 (four) hours as needed. 12/28/22     HYDROmorphone (DILAUDID) 4 MG tablet Take 1 tablet (4 mg total) by mouth every 4 (four) hours, as needed 01/11/23     hydrOXYzine (ATARAX) 25 MG tablet Take 1 tablet (25 mg total) by mouth every 6 (six) hours as needed for anxiety. 01/24/23   Azucena Fallen, MD  lamoTRIgine (LAMICTAL) 200 MG tablet TAKE 1 TABLET BY MOUTH EVERYDAY AT BEDTIME Patient taking differently: Take 200 mg by mouth at bedtime. 12/24/22   Mozingo, Thereasa Solo, NP  lamoTRIgine (LAMICTAL) 25 MG tablet Take 1 tablet (25 mg total) by mouth daily. 03/04/23   Mozingo, Thereasa Solo, NP  Lancets [facility] DELICA PLUS [place]) MISC 3 (three) times daily. for testing 06/12/21   [provider]  LORazepam (ATIVAN) 0.5 MG tablet Take 1 tablet (0.5 mg total) by mouth 3 (three) times daily as needed for anxiety. 02/11/23   Mozingo, Thereasa Solo, NP  menthol-cetylpyridinium (CEPACOL) 3 MG lozenge Take 1 lozenge (3 mg total) by mouth as needed for sore throat. 09/17/22   Rolly Salter, MD  nitroGLYCERIN (NITROSTAT) 0.4 MG SL tablet Dissolve 1 tablet under the tongue every 5 minutes as needed for chest pain. Max of 3 doses, then 911. 08/20/22   Nahser, Deloris Ping, MD  [facility] VERIO test strip 3 (three) times daily. for testing 06/12/21   [provider]  OXYGEN Inhale 4-5 L/min into the lungs continuous.    [provider]  PROAIR HFA 108 [phone] Base) MCG/ACT inhaler Inhale 2 puffs into the lungs every 4 (four) hours as needed for wheezing or shortness of breath.    [provider]  QVAR REDIHALER 80 MCG/ACT  inhaler INHALE 1 PUFF INTO THE LUNGS TWICE A DAY Patient taking differently: Inhale 1 puff into the lungs 2 (two) times daily. 01/21/23   Luciano Cutter, MD  Rimegepant Sulfate (NURTEC) 75 MG TBDP Take 75 mg by mouth daily as needed (Migraine).     [provider]  rosuvastatin (CRESTOR) 10 MG tablet Take 10 mg by mouth daily. Mid morning 02/28/18   [provider]  sacubitril-valsartan (ENTRESTO) 24-26 MG Take 1 tablet by mouth 2 (two) times daily. 09/22/22   Rolly Salter, MD  silver sulfADIAZINE (SILVADENE) 1 % cream Apply to affected area daily 09/17/22 09/17/23  Rolly Salter, MD  sitaGLIPtin (JANUVIA) 100 MG tablet Take 1 tablet (100 mg total) by mouth daily. 10/19/21   Serena Croissant, MD  Sodium Chloride Flush (NORMAL SALINE FLUSH) 0.9 % SOLN Use as directed for central venous  catheter maintenance 03/05/23     Sodium Chloride Flush (NORMAL SALINE FLUSH) 0.9 % SOLN Use as directed for central venous catheter maintenance 01/31/23     sucralfate (CARAFATE) 1 GM/10ML suspension Take 1 g by mouth daily as needed (as directed for ulcers).    [provider]  SYNTHROID 100 MCG tablet Take 1 tablet (100 mcg total) by mouth every morning. Patient taking differently: Take 100 mcg by mouth every morning. * Must be name brand* 10/19/21   Serena Croissant, MD  Tiotropium Bromide Monohydrate (SPIRIVA RESPIMAT) 1.25 MCG/ACT AERS Inhale 2 puffs into the lungs daily. 08/15/22   Luciano Cutter, MD  topiramate (TOPAMAX) 50 MG tablet Take 150 mg by mouth at bedtime.  12/25/19   [provider]  zolpidem (AMBIEN CR) 12.5 MG CR tablet Take 12.5 mg by mouth at bedtime. 03/05/22   [provider]      Allergies    Ferrous bisglycinate chelate [iron], Mushroom extract complex, Na ferric gluc cplx in sucrose, Cymbalta [duloxetine hcl], Hydromorphone, Ondansetron hcl, Promethazine, Succinylcholine, Buprenorphine hcl, Compazine, Duloxetine, Metoclopramide, Morphine and codeine,  Ondansetron, Promethazine hcl, and Tegaderm ag mesh [silver]    Review of Systems   Review of Systems  All other systems reviewed and are negative.   Physical Exam Updated Vital Signs BP 123/69 (BP Location: Right Arm)   Pulse (!) 111   Temp 99.2 F (37.3 C) (Oral)   Resp 20   LMP  (LMP Unknown)   SpO2 98%  Physical Exam Vitals and nursing note reviewed.  Constitutional:      General: She is not in acute distress.    Appearance: She is well-developed.     Comments: Chronically ill-appearing female laying in bed, tearful but in no acute respiratory discomfort.  Patient with supplemental oxygen.  HENT:     Head: Atraumatic.  Eyes:     Conjunctiva/sclera: Conjunctivae normal.  Cardiovascular:     Rate and Rhythm: Tachycardia present.     Pulses: Normal pulses.     Heart sounds: Murmur heard.     Comments: Port-A-Cath on right chest with normal appearance. Pulmonary:     Effort: Pulmonary effort is normal.     Breath sounds: Rales (Rales left lower lobe) present.  Abdominal:     Palpations: Abdomen is soft.     Tenderness: There is no abdominal tenderness.  Musculoskeletal:     Cervical back: Normal range of motion and neck supple.     Right lower leg: Edema present.     Left lower leg: Edema present.  Skin:    Findings: No rash.  Neurological:     Mental Status: She is alert and oriented to person, place, and time.  Psychiatric:        Mood and Affect: Mood normal.     ED Results / Procedures / Treatments   Labs (all labs ordered are listed, but only abnormal results are displayed) Labs Reviewed  COMPREHENSIVE METABOLIC PANEL - Abnormal; Notable for the following components:      Result Value   Potassium 3.2 (*)    Glucose, Bld 110 (*)    BUN 31 (*)    Creatinine, Ser 1.26 (*)    GFR, Estimated 52 (*)    All other components within normal limits  CBC WITH DIFFERENTIAL/PLATELET - Abnormal; Notable for the following components:   WBC 20.1 (*)    RBC 3.69  (*)    Hemoglobin 9.0 (*)    HCT 29.5 (*)  MCV 79.9 (*)    MCH 24.4 (*)    RDW 18.6 (*)    Neutro Abs 15.9 (*)    Monocytes Absolute 1.1 (*)    Abs Immature Granulocytes 0.33 (*)    All other components within normal limits  APTT - Abnormal; Notable for the following components:   aPTT 43 (*)    All other components within normal limits  RESP PANEL BY RT-PCR (RSV, FLU A&B, COVID)  RVPGX2  CULTURE, BLOOD (ROUTINE X 2)  CULTURE, BLOOD (ROUTINE X 2)  EXPECTORATED SPUTUM ASSESSMENT W GRAM STAIN, RFLX TO RESP C  LACTIC ACID, PLASMA  PROTIME-INR  LACTIC ACID, PLASMA  URINALYSIS, W/ REFLEX TO CULTURE (INFECTION SUSPECTED)  FERRITIN  IRON AND TIBC  VITAMIN B12  TSH  PROCALCITONIN  PROCALCITONIN  LEGIONELLA PNEUMOPHILA SEROGP 1 UR AG  STREP PNEUMONIAE URINARY ANTIGEN    EKG EKG Interpretation  Date/Time:  Saturday March 09 2023 14:59:28 EDT Ventricular Rate:  109 PR Interval:  138 QRS Duration: 79 QT Interval:  358 QTC Calculation: 483 R Axis:   -31 Text Interpretation: No significant change since last tracing Confirmed by Richardean Canal [phone]) on 03/09/2023 5:45:32 PM  Radiology DG Chest 2 View  Result Date: 03/09/2023 CLINICAL DATA:  Sepsis EXAM: CHEST - 2 VIEW COMPARISON:  X-ray 01/22/2023.  CT scan 01/23/2023 FINDINGS: Stable right IJ line with tip overlying the upper right atrium near the SVC margin. Bilateral axillary surgical clips. Underinflation. No pneumothorax or effusion. Developing ill-defined parenchymal interstitial opacities along the lung bases, left-greater-than-right. No pneumothorax or effusion. Stable cardiopericardial silhouette with a calcified aorta. IMPRESSION: Developing opacities along the lung bases, left-greater-than-right. Possible infiltrate. Recommend follow-up. Electronically Signed   By: Karen Kays M.D.   On: 03/09/2023 16:34    Procedures .Critical Care  Performed by: Fayrene Helper, PA-C Authorized by: Fayrene Helper, PA-C   Critical care  provider statement:    Critical care time (minutes):  30   Critical care was time spent personally by me on the following activities:  Development of treatment plan with patient or surrogate, discussions with consultants, evaluation of patient's response to treatment, examination of patient, ordering and review of laboratory studies, ordering and review of radiographic studies, ordering and performing treatments and interventions, pulse oximetry, re-evaluation of patient's condition and review of old charts     Medications Ordered in ED Medications  ceFEPIme (MAXIPIME) 1 g in sodium chloride 0.9 % 100 mL IVPB (has no administration in time range)  lactated ringers infusion (has no administration in time range)  vancomycin (VANCOREADY) IVPB 1500 mg/300 mL (has no administration in time range)  lactated ringers bolus 1,000 mL (0 mLs Intravenous Stopped 03/09/23 1818)  diphenhydrAMINE (BENADRYL) injection 25 mg (25 mg Intravenous Given 03/09/23 1642)  heparin lock flush 100 UNIT/ML injection (500 Units  Given 03/09/23 1643)  hydrocortisone sodium succinate (SOLU-CORTEF) 100 MG injection 100 mg (100 mg Intravenous Given 03/09/23 1820)    ED Course/ Medical Decision Making/ A&P                             Medical Decision Making Amount and/or Complexity of Data Reviewed Labs: ordered. Radiology: ordered. ECG/medicine tests: ordered.  Risk Prescription drug management.   BP 123/69 (BP Location: Right Arm)   Pulse (!) 111   Temp 99.2 F (37.3 C) (Oral)   Resp 20   Ht 5\' 5"  (1.651 m)   Wt 78 kg  LMP  (LMP Unknown)   SpO2 98%   BMI 28.62 kg/m   23:54 PM  52 year old female with multiple comorbidity with includes hypertension, CHF, oxygen lymphoma, thyroid cancer, aortic stenosis, depression, adrenal insufficiency brought here via EMS with concerns of sepsis.  Patient states that for the past year and a half she has had multiple hospital admission due to sepsis.  She endorsed having a fever  as high as 104 this morning.  She felt bad since yesterday.  She endorsed persistent cough, and shortness of breath.  She have not urinate for the past 2 days.  She feels depressed and felt that she is likely having sepsis again.  She endorsed nausea without vomiting.  She endorsed global weakness.  She is DNR/DNI and is starting hospice care.  She wears home supplemental oxygen at 5 L.  She tries alternate between Tylenol and ibuprofen for symptoms at home as well as using her DuoNebs and breathing treatment without relief.  On exam this is a chronically ill-appearing female wearing supplemental oxygen, laying in bed appears tearful.  She is able to speak in complete sentences and not in any acute respiratory discomfort.  She is on her supplemental oxygen at 5 L.  Heart with tachycardia and murmurs.  Lungs with crackles to the left lower lung base.  Abdomen is soft nontender, trace peripheral edema noted to bilateral lower extremities.  Vital signs notable for an oral temperature of 99.2 and heart rate of 111.  -Labs ordered, independently viewed and interpreted by me.  Labs remarkable for K+ 3.2, supplementation given.  Cr 1.26 similar to baseline.  WBC 20.1, Hgb 9.0 -The patient was maintained on a cardiac monitor.  I personally viewed and interpreted the cardiac monitored which showed an underlying rhythm of: sinus tachycardia -Imaging independently viewed and interpreted by me and I agree with radiologist's interpretation.  Result remarkable for CXR showing focal infiltrates to L lung -This patient presents to the ED for concern of fever and fatigue, this involves an extensive number of treatment options, and is a complaint that carries with it a high risk of complications and morbidity.  The differential diagnosis includes pneumonia, UTI, viral illness, cellulitis, PE -Co morbidities that complicate the patient evaluation includes cancer, addison's disease, heart failure, recurrent sepsis -Treatment  includes vanc/cefepime, hydrocortisone, IVF, benadryl -Reevaluation of the patient after these medicines showed that the patient improved -PCP office notes or outside notes reviewed -Discussion with specialist [facility] Dr. Artis Flock who agrees to admit pt -Escalation to admission/observation considered: patient is agreeable to admission        Final Clinical Impression(s) / ED Diagnoses Final diagnoses:  HCAP (healthcare-associated pneumonia)  Sepsis, due to unspecified organism, unspecified whether acute organ dysfunction present [facility])  Hypokalemia    Rx / DC Orders ED Discharge Orders     None         Fayrene Helper, PA-C 03/09/23 1831    Charlynne Pander, MD 03/09/23 2326

## 2023-03-09 NOTE — ED Notes (Signed)
Pt resting in bed with no acute distress noted at this time.  

## 2023-03-09 NOTE — ED Triage Notes (Signed)
Patient brought in from home by EMS for failure to thrive. Patient has HX of lung and heart failure. She is currently palliative care and was scheduled to see hospice next week. She has c/o fever at home. Per EMS 2 neb treatment given.  106/72 110 Port to R chest

## 2023-03-09 NOTE — Assessment & Plan Note (Signed)
Continue crestor 10mg daily  

## 2023-03-09 NOTE — Assessment & Plan Note (Signed)
Check magnesium Replete and trend  

## 2023-03-10 DIAGNOSIS — I5022 Chronic systolic (congestive) heart failure: Secondary | ICD-10-CM | POA: Diagnosis present

## 2023-03-10 DIAGNOSIS — E785 Hyperlipidemia, unspecified: Secondary | ICD-10-CM | POA: Diagnosis present

## 2023-03-10 DIAGNOSIS — R627 Adult failure to thrive: Secondary | ICD-10-CM | POA: Diagnosis present

## 2023-03-10 DIAGNOSIS — E890 Postprocedural hypothyroidism: Secondary | ICD-10-CM | POA: Diagnosis present

## 2023-03-10 DIAGNOSIS — D509 Iron deficiency anemia, unspecified: Secondary | ICD-10-CM | POA: Diagnosis present

## 2023-03-10 DIAGNOSIS — F411 Generalized anxiety disorder: Secondary | ICD-10-CM | POA: Diagnosis present

## 2023-03-10 DIAGNOSIS — E271 Primary adrenocortical insufficiency: Secondary | ICD-10-CM | POA: Diagnosis present

## 2023-03-10 DIAGNOSIS — J4550 Severe persistent asthma, uncomplicated: Secondary | ICD-10-CM | POA: Diagnosis present

## 2023-03-10 DIAGNOSIS — J440 Chronic obstructive pulmonary disease with acute lower respiratory infection: Secondary | ICD-10-CM | POA: Diagnosis present

## 2023-03-10 DIAGNOSIS — K219 Gastro-esophageal reflux disease without esophagitis: Secondary | ICD-10-CM | POA: Diagnosis present

## 2023-03-10 DIAGNOSIS — Z9981 Dependence on supplemental oxygen: Secondary | ICD-10-CM | POA: Diagnosis not present

## 2023-03-10 DIAGNOSIS — Z515 Encounter for palliative care: Secondary | ICD-10-CM | POA: Diagnosis not present

## 2023-03-10 DIAGNOSIS — J181 Lobar pneumonia, unspecified organism: Secondary | ICD-10-CM | POA: Diagnosis present

## 2023-03-10 DIAGNOSIS — I5082 Biventricular heart failure: Secondary | ICD-10-CM | POA: Diagnosis present

## 2023-03-10 DIAGNOSIS — I350 Nonrheumatic aortic (valve) stenosis: Secondary | ICD-10-CM | POA: Diagnosis present

## 2023-03-10 DIAGNOSIS — Z1152 Encounter for screening for COVID-19: Secondary | ICD-10-CM | POA: Diagnosis not present

## 2023-03-10 DIAGNOSIS — E876 Hypokalemia: Secondary | ICD-10-CM | POA: Diagnosis present

## 2023-03-10 DIAGNOSIS — J9611 Chronic respiratory failure with hypoxia: Secondary | ICD-10-CM | POA: Diagnosis present

## 2023-03-10 DIAGNOSIS — N1831 Chronic kidney disease, stage 3a: Secondary | ICD-10-CM | POA: Diagnosis present

## 2023-03-10 DIAGNOSIS — I428 Other cardiomyopathies: Secondary | ICD-10-CM | POA: Diagnosis present

## 2023-03-10 DIAGNOSIS — I2729 Other secondary pulmonary hypertension: Secondary | ICD-10-CM | POA: Diagnosis present

## 2023-03-10 DIAGNOSIS — I130 Hypertensive heart and chronic kidney disease with heart failure and stage 1 through stage 4 chronic kidney disease, or unspecified chronic kidney disease: Secondary | ICD-10-CM | POA: Diagnosis present

## 2023-03-10 DIAGNOSIS — A419 Sepsis, unspecified organism: Secondary | ICD-10-CM | POA: Diagnosis present

## 2023-03-10 DIAGNOSIS — J189 Pneumonia, unspecified organism: Secondary | ICD-10-CM | POA: Diagnosis not present

## 2023-03-10 DIAGNOSIS — Z66 Do not resuscitate: Secondary | ICD-10-CM | POA: Diagnosis present

## 2023-03-10 DIAGNOSIS — F32A Depression, unspecified: Secondary | ICD-10-CM | POA: Diagnosis present

## 2023-03-10 LAB — RESPIRATORY PANEL BY PCR

## 2023-03-10 LAB — BASIC METABOLIC PANEL
Anion gap: 9 (ref 5–15)
BUN: 27 mg/dL — ABNORMAL HIGH (ref 6–20)
CO2: 24 mmol/L (ref 22–32)
Calcium: 8.6 mg/dL — ABNORMAL LOW (ref 8.9–10.3)
Chloride: 106 mmol/L (ref 98–111)
Creatinine, Ser: 1.11 mg/dL — ABNORMAL HIGH (ref 0.44–1.00)
GFR, Estimated: >60 mL/min (ref >60)
Glucose, Bld: 148 mg/dL — ABNORMAL HIGH (ref 70–99)
Potassium: 3.3 mmol/L — ABNORMAL LOW (ref 3.5–5.1)
Sodium: 139 mmol/L (ref 135–145)

## 2023-03-10 LAB — PROCALCITONIN: Procalcitonin: 0.18 ng/mL

## 2023-03-10 LAB — CBC
HCT: 27.8 % — ABNORMAL LOW (ref 36.0–46.0)
Hemoglobin: 8.4 g/dL — ABNORMAL LOW (ref 12.0–15.0)
MCH: 24.6 pg — ABNORMAL LOW (ref 26.0–34.0)
MCHC: 30.2 g/dL (ref 30.0–36.0)
MCV: 81.5 fL (ref 80.0–100.0)
Platelets: 356 10*3/uL (ref 150–400)
RBC: 3.41 MIL/uL — ABNORMAL LOW (ref 3.87–5.11)
RDW: 18.8 % — ABNORMAL HIGH (ref 11.5–15.5)
WBC: 15.6 10*3/uL — ABNORMAL HIGH (ref 4.0–10.5)
nRBC: 0 % (ref 0.0–0.2)

## 2023-03-10 LAB — CULTURE, RESPIRATORY W GRAM STAIN

## 2023-03-10 LAB — CULTURE, BLOOD (ROUTINE X 2)

## 2023-03-10 LAB — MRSA NEXT GEN BY PCR, NASAL: MRSA by PCR Next Gen: NOT DETECTED

## 2023-03-10 MED ORDER — DIPHENHYDRAMINE HCL 50 MG/ML IJ SOLN
12.5000 mg | Freq: Four times a day (QID) | INTRAMUSCULAR | Status: DC | PRN
  Administered 2023-03-10 – 2023-03-13 (×11): 12.5 mg via INTRAVENOUS
  Filled 2023-03-10 (×12): qty 1

## 2023-03-10 MED ORDER — SODIUM CHLORIDE 0.9 % IV SOLN
2.0000 g | Freq: Two times a day (BID) | INTRAVENOUS | Status: DC
  Administered 2023-03-10: 2 g via INTRAVENOUS
  Filled 2023-03-10: qty 12.5

## 2023-03-10 MED ORDER — LEVOTHYROXINE SODIUM 100 MCG PO TABS
100.0000 ug | ORAL_TABLET | Freq: Every day | ORAL | Status: DC
  Administered 2023-03-11 – 2023-03-13 (×3): 100 ug via ORAL
  Filled 2023-03-10 (×3): qty 1

## 2023-03-10 MED ORDER — AZITHROMYCIN 250 MG PO TABS
500.0000 mg | ORAL_TABLET | Freq: Every day | ORAL | Status: DC
  Administered 2023-03-10 – 2023-03-12 (×3): 500 mg via ORAL
  Filled 2023-03-10 (×3): qty 2

## 2023-03-10 MED ORDER — VANCOMYCIN HCL 1250 MG/250ML IV SOLN
1250.0000 mg | INTRAVENOUS | Status: DC
  Administered 2023-03-10: 1250 mg via INTRAVENOUS
  Filled 2023-03-10: qty 250

## 2023-03-10 MED ORDER — LEVOTHYROXINE SODIUM 100 MCG PO TABS
100.0000 ug | ORAL_TABLET | Freq: Once | ORAL | Status: AC
  Administered 2023-03-10: 100 ug via ORAL
  Filled 2023-03-10: qty 1

## 2023-03-10 MED ORDER — POTASSIUM CHLORIDE CRYS ER 20 MEQ PO TBCR
40.0000 meq | EXTENDED_RELEASE_TABLET | ORAL | Status: AC
  Administered 2023-03-10 (×2): 40 meq via ORAL
  Filled 2023-03-10 (×2): qty 2

## 2023-03-10 MED ORDER — CLONAZEPAM 1 MG PO TABS
1.0000 mg | ORAL_TABLET | Freq: Three times a day (TID) | ORAL | Status: DC
  Administered 2023-03-10 – 2023-03-13 (×9): 1 mg via ORAL
  Filled 2023-03-10 (×9): qty 1

## 2023-03-10 MED ORDER — SODIUM CHLORIDE 0.9 % IV SOLN: 2.0000 g | Freq: Three times a day (TID) | INTRAVENOUS | Status: DC

## 2023-03-10 MED ORDER — DIPHENHYDRAMINE HCL 50 MG/ML IJ SOLN: 25.0000 mg | Freq: Four times a day (QID) | INTRAMUSCULAR | Status: DC | PRN

## 2023-03-10 MED ORDER — CLONAZEPAM 1 MG PO TABS: 1.0000 mg | ORAL_TABLET | Freq: Three times a day (TID) | ORAL | Status: DC

## 2023-03-10 MED ORDER — LEVOTHYROXINE SODIUM 100 MCG PO TABS: 100.0000 ug | ORAL_TABLET | Freq: Every day | ORAL | Status: DC

## 2023-03-10 MED ORDER — SODIUM CHLORIDE 0.9 % IV SOLN
2.0000 g | Freq: Every day | INTRAVENOUS | Status: DC
  Administered 2023-03-10 – 2023-03-13 (×4): 2 g via INTRAVENOUS
  Filled 2023-03-10 (×4): qty 20

## 2023-03-10 MED ORDER — LORAZEPAM 2 MG/ML IJ SOLN
0.5000 mg | Freq: Three times a day (TID) | INTRAMUSCULAR | Status: DC | PRN
  Administered 2023-03-10 – 2023-03-13 (×8): 0.5 mg via INTRAVENOUS
  Filled 2023-03-10 (×8): qty 1

## 2023-03-10 NOTE — Progress Notes (Signed)
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 Note the patient is on palliative care at home having recently graduated from hospice. Patient presented to the hospital for temperature of 104 and fevers for about 1 week, increased cough over the past 1 to 2 weeks and was noted to have a temperature of 99.2 and a heart rate greater than 100. Lactic acid noted to be 3.0. WBC count 20.1. Chest x-ray revealed left greater than right lower lobe opacities. UA was not consistent with an infection.  She was given ceftriaxone, azithromycin and cefepime along with Solu-Cortef and IV fluids.   Subjective:  Still has a cough.  Appears quite anxious and states she has chronic nausea.  She would like Benadryl and Ativan via IV which she states is mainly for her nausea. She has no other complaints Assessment and Plan: Principal Problem:   Sepsis  -Felt to be due to pneumonia -COVID, influenza and respiratory panel negative - Follow blood cultures -Documented fever in the hospital but did have significant leukocytosis and has crackles in left lower lobe along with complaints of increased cough - DC cefepime and change back to ceftriaxone -Continue azithromycin -DC vancomycin if cultures negative tomorrow  Active Problems: Chronic nausea -Continue medications as she takes  at home  Chronic respiratory failure - Interestingly, there is no cause for this but she is on 4 L of oxygen at baseline per last pulmonary note  Moderate persistent asthma - Advair, albuterol, Qvar and ProAir listed on home meds which are likely duplicates -No wheezing noted in the hospital  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 - Given stress dose steroids - Continue home dose of hydrocortisone now    Hypokalemia Systolic heart failure, chronic with improvement in EF -Likely secondary to Bumex-replace - Her last 4 echoes dating back to 2021 all reveal an EF of 50 to 55% or greater and either no or grade 1 diastolic dysfunction - Okay to hold Bumex and Entresto for today -Also takes Januvia  Chronic kidney disease stage IIIa    Microcytic anemia -Continue outpatient follow-up -Ferritin noted to be 17-will discuss iron tablets with her versus iron infusions    Generalized anxiety disorder/depression -This appears to be quite severe - She takes both clonazepam and Ativan at home according to the med rec and the patient -In addition, she takes Wellbutrin and escitalopram  Chronic pain? -She takes 4 mg of Dilaudid as needed as outpatient which she states she only needs occasionally  Numerous duplicates of medications listed on home med rec.  Have notified pharmacy to follow-up on the med rec so we have accurate information.      Code Status: DNR Consultants: None Level of Care: Level of care: Telemetry Total time on patient care: 40 minutes DVT prophylaxis: Lovenox  Objective:   Vitals:   03/10/23 0152 03/10/23 0545 03/10/23 0827 03/10/23 0951  BP: 108/68 112/71  115/67  Pulse:  89 87  95  Resp: 18 18  17   Temp: (!) 97.5 F (36.4 C) 98 F (36.7 C)  98.1 F (36.7 C)  TempSrc: Oral Oral  Oral  SpO2: 100% 100% 99% 100%  Weight:      Height:       Filed Weights   03/09/23 1516  Weight: 78 kg   Exam: General exam: Appears extremely  anxious and is tearful HEENT: oral mucosa moist Respiratory system: Crackles in left lung base Cardiovascular system: S1 & S2 heard  Gastrointestinal system: Abdomen soft, non-tender, nondistended. Normal bowel sounds   Extremities: No cyanosis, clubbing-swelling of hands and feet present Psychiatry: Anxiety and depression noted     CBC: Recent Labs  Lab 03/09/23 1542 03/10/23 0522  WBC 20.1* 15.6*  NEUTROABS 15.9*  --   HGB 9.0* 8.4*  HCT 29.5* 27.8*  MCV 79.9* 81.5  PLT 380 356   Basic Metabolic Panel: Recent Labs  Lab 03/09/23 1542 03/09/23 2011 03/10/23 0522  NA 135  --  139  K 3.2*  --  3.3*  CL 99  --  106  CO2 24  --  24  GLUCOSE 110*  --  148*  BUN 31*  --  27*  CREATININE 1.26*  --  1.11*  CALCIUM 8.9  --  8.6*  MG  --  2.0  --    GFR: Estimated Creatinine Clearance: 61.9 mL/min (A) (by C-G formula based on SCr of 1.11 mg/dL (H)).  Scheduled Meds:  aspirin EC  81 mg Oral QHS   azithromycin  500 mg Oral Daily   budesonide (PULMICORT) nebulizer solution  0.25 mg Nebulization BID   buPROPion  150 mg Oral Daily   clonazePAM  1 mg Oral TID   dronabinol  2.5 mg Oral QHS   enoxaparin (LOVENOX) injection  40 mg Subcutaneous Q24H   escitalopram  20 mg Oral QPM   hydrocortisone  20 mg Oral Daily   And   hydrocortisone  10 mg Oral QHS   lamoTRIgine  200 mg Oral QHS   lamoTRIgine  25 mg Oral Daily   [START ON 03/11/2023] levothyroxine  100 mcg Oral Q0600   mometasone-formoterol  2 puff Inhalation BID   pantoprazole  40 mg Oral Daily   potassium chloride  40 mEq Oral Q4H   rosuvastatin  10 mg Oral QPM   saccharomyces boulardii  250 mg Oral BID   topiramate  150 mg Oral QHS   umeclidinium bromide  1 puff Inhalation Daily   Continuous Infusions:  ceFEPime (MAXIPIME) IV     vancomycin     Imaging and lab data was personally reviewed DG Chest 2 View  Result Date: 03/09/2023 CLINICAL DATA:  Sepsis EXAM: CHEST - 2 VIEW COMPARISON:  X-ray 01/22/2023.  CT  scan 01/23/2023 FINDINGS: Stable right IJ line with tip overlying the upper right atrium near the SVC margin. Bilateral axillary surgical clips. Underinflation. No pneumothorax or effusion. Developing ill-defined parenchymal interstitial opacities along the lung bases, left-greater-than-right. No pneumothorax or effusion. Stable cardiopericardial silhouette with a calcified aorta. IMPRESSION: Developing opacities along the lung bases, left-greater-than-right. Possible infiltrate. Recommend follow-up. Electronically Signed   By: Karen Kays M.D.   On: 03/09/2023 16:34    LOS: 0 days   Author: Ladell Heads Meldon Hanzlik  03/10/2023 12:20 PM  To contact [facility]>   Check the care team in [facility] and look for the attending/consulting [facility] provider listed  Log into [email or web address] and use [place]'s universal password  Go to> "[facility]"  and find provider  If you still have difficulty reaching the provider, please page the [facility] (Director on Call) for the Hospitalists listed on amion

## 2023-03-10 NOTE — Progress Notes (Signed)
PHARMACY NOTE:  ANTIMICROBIAL RENAL DOSAGE ADJUSTMENT  Current antimicrobial regimen includes a mismatch between antimicrobial dosage and estimated renal function.  As per policy approved by the Pharmacy & Therapeutics and Medical Executive Committees, the antimicrobial dosage will be adjusted accordingly.  Current antimicrobial dosage:  cefepime 2 g IV q12h  Indication: pneumonia  Renal Function:  Estimated Creatinine Clearance: 61.9 mL/min (A) (by C-G formula based on SCr of 1.11 mg/dL (H)).     Antimicrobial dosage has been changed to:  2 g IV q8h    Thank you for allowing pharmacy to be a part of this patient's care.  Pricilla Riffle, PharmD, BCPS Clinical Pharmacist 03/10/2023 8:01 AM

## 2023-03-10 NOTE — Progress Notes (Signed)
Pharmacy Antibiotic Note  [name] is a 52 y.o. female admitted on 03/09/2023 with pneumonia.  Pharmacy has been consulted for Cefepime + Vancomycin dosing.  Plan: Cefepime 2gm IV q12h Vancomycin 1250mg  IV q24h to target AUC 400-550 Estimated AUC on this regimen = 508. Monitor renal function and cx data   Height: 5\' 5"  (165.1 cm) Weight: 78 kg (172 lb) IBW/kg (Calculated) : 57  Temp (24hrs), Avg:98.5 F (36.9 C), Min:97.5 F (36.4 C), Max:99.2 F (37.3 C)  Recent Labs  Lab 03/09/23 1542 03/09/23 1825  WBC 20.1*  --   CREATININE 1.26*  --   LATICACIDVEN 1.0 0.9    Estimated Creatinine Clearance: 54.5 mL/min (A) (by C-G formula based on SCr of 1.26 mg/dL (H)).    Allergies  Allergen Reactions   Ferrous Bisglycinate Chelate [Iron] Anaphylaxis and Other (See Comments)    Only IV - only FERRLICET   Mushroom Extract Complex Anaphylaxis   Na Ferric Gluc Cplx In Sucrose Anaphylaxis   Cymbalta [Duloxetine Hcl] Swelling and Anxiety   Hydromorphone Other (See Comments)    BP drop and heart rate drops 5.1.20 PT REPORTS THAT SHE TAKES DILAUDID AT HOME   Ondansetron Hcl Other (See Comments)   Promethazine Other (See Comments)    Other reaction(s): Unknown   Succinylcholine Other (See Comments)    Lock Jaw   Buprenorphine Hcl Hives   Compazine Other (See Comments)    Altered mental status Aggression   Duloxetine     Other reaction(s): Not available   Metoclopramide Other (See Comments)    Dystonia   Morphine And Codeine Hives   Ondansetron Hives and Rash    Other reaction(s): Unknown Other reaction(s): Unknown   Promethazine Hcl Hives   Tegaderm Ag Mesh [Silver] Rash    Old formulation only, is able to tolerate new formulation    Antimicrobials this admission: 6/8 Cefepime >>  6/8 Vancomycin >>  6/8 Rocephin/Zithromax x1  Dose adjustments this admission:  Microbiology results: 6/8 BCx:  6/8 Sputum:   MRSA PCR:   Thank you for allowing pharmacy to be a  part of this patient's care.  Junita Push PharmD 03/10/2023 4:03 AM

## 2023-03-11 ENCOUNTER — Encounter (HOSPITAL_COMMUNITY): Payer: Self-pay | Admitting: Internal Medicine

## 2023-03-11 DIAGNOSIS — A419 Sepsis, unspecified organism: Secondary | ICD-10-CM | POA: Diagnosis not present

## 2023-03-11 DIAGNOSIS — J189 Pneumonia, unspecified organism: Secondary | ICD-10-CM | POA: Diagnosis not present

## 2023-03-11 LAB — BASIC METABOLIC PANEL
Anion gap: 7 (ref 5–15)
BUN: 19 mg/dL (ref 6–20)
CO2: 24 mmol/L (ref 22–32)
Calcium: 8.8 mg/dL — ABNORMAL LOW (ref 8.9–10.3)
Chloride: 108 mmol/L (ref 98–111)
Creatinine, Ser: 1.01 mg/dL — ABNORMAL HIGH (ref 0.44–1.00)
GFR, Estimated: >60 mL/min (ref >60)
Glucose, Bld: 83 mg/dL (ref 70–99)
Potassium: 4.2 mmol/L (ref 3.5–5.1)
Sodium: 139 mmol/L (ref 135–145)

## 2023-03-11 LAB — LEGIONELLA PNEUMOPHILA SEROGP 1 UR AG: L. pneumophila Serogp 1 Ur Ag: NEGATIVE

## 2023-03-11 LAB — BLOOD CULTURE ID PANEL (REFLEXED) - BCID2

## 2023-03-11 LAB — CULTURE, RESPIRATORY W GRAM STAIN

## 2023-03-11 LAB — CBC
HCT: 27.8 % — ABNORMAL LOW (ref 36.0–46.0)
Hemoglobin: 8.2 g/dL — ABNORMAL LOW (ref 12.0–15.0)
MCH: 24.4 pg — ABNORMAL LOW (ref 26.0–34.0)
MCHC: 29.5 g/dL — ABNORMAL LOW (ref 30.0–36.0)
MCV: 82.7 fL (ref 80.0–100.0)
Platelets: 366 10*3/uL (ref 150–400)
RBC: 3.36 MIL/uL — ABNORMAL LOW (ref 3.87–5.11)
RDW: 19.1 % — ABNORMAL HIGH (ref 11.5–15.5)
WBC: 12.9 10*3/uL — ABNORMAL HIGH (ref 4.0–10.5)
nRBC: 0 % (ref 0.0–0.2)

## 2023-03-11 LAB — PROCALCITONIN: Procalcitonin: 0.1 ng/mL

## 2023-03-11 MED ORDER — GUAIFENESIN ER 600 MG PO TB12
1200.0000 mg | ORAL_TABLET | Freq: Two times a day (BID) | ORAL | Status: DC
  Administered 2023-03-11 – 2023-03-13 (×5): 1200 mg via ORAL
  Filled 2023-03-11 (×5): qty 2

## 2023-03-11 MED ORDER — SACUBITRIL-VALSARTAN 24-26 MG PO TABS
1.0000 | ORAL_TABLET | Freq: Two times a day (BID) | ORAL | Status: DC
  Administered 2023-03-11 – 2023-03-13 (×4): 1 via ORAL
  Filled 2023-03-11 (×4): qty 1

## 2023-03-11 MED ORDER — IPRATROPIUM-ALBUTEROL 0.5-2.5 (3) MG/3ML IN SOLN
3.0000 mL | Freq: Four times a day (QID) | RESPIRATORY_TRACT | Status: DC
  Administered 2023-03-11 – 2023-03-13 (×8): 3 mL via RESPIRATORY_TRACT
  Filled 2023-03-11 (×9): qty 3

## 2023-03-11 NOTE — Progress Notes (Signed)
Pt is not ready to start vest therapy at this time. She wants to start it after she has had her reflux meds.

## 2023-03-11 NOTE — Progress Notes (Signed)
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 Note the patient is on palliative care at home having recently graduated from hospice. Patient presented to the hospital for temperature of 104 and fevers for about 1 week, increased cough over the past 1 to 2 weeks and was noted to have a temperature of 99.2 and a heart rate greater than 100. Lactic acid noted to be 3.0. WBC count 20.1. Chest x-ray revealed left greater than right lower lobe opacities. UA was not consistent with an infection.  She was given ceftriaxone, azithromycin and cefepime along with Solu-Cortef and IV fluids.   Subjective:  Ongoing severe cough. Has dyspnea as well.  Assessment and Plan: Principal Problem:   Sepsis  -Felt to be due to pneumonia -COVID, influenza and respiratory panel negative - 1 set of blood culture + for strep species  - No documented fever in the hospital but did have significant leukocytosis and has crackles in left lower lobe along with complaints of increased cough - Cont ceftriaxone and azithromycin -DC vancomycin as blood cultures neg for staph  Active Problems:   Moderate persistent asthma   - Advair, Spiriva, albuterol, Qvar and ProAir listed home meds-  duplicates noted -No wheezing but still has an excessive amount of cough with difficulty bringing up  sputum - add Mucinex 1200 BID and Flutter valve - cont Pulmicort, Duonebs Q6, Dulera, Incruse Ellipta  Chronic nausea -Continue medications as she takes at home  Chronic respiratory failure - Interestingly, there is no list cause for this but she is on 4 L of oxygen at baseline per last pulmonary note by Dr Everardo All - no PAH on cath in 12/23  Systolic heart failure, chronic with improvement in EF - Her last 4 echoes dating back to 2021 all reveal an EF of 50 to 55% or greater and either no or grade 1 diastolic dysfunction - cath in 12/23 revealed mild Ao stenosis and normal coronaries.  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 -She takes 4 mg of Dilaudid as needed as outpatient which she states she only needs occasionally  Numerous duplicates of medications listed on home med rec.  Have notified pharmacy to follow-up on the med rec so we have accurate information.      Code Status: DNR Consultants: None Level of Care: Level of care: Med-Surg Total time on patient care: 40 minutes DVT prophylaxis: Lovenox  Objective:  Vitals:   03/10/23 2025 03/11/23 0504 03/11/23 0817 03/11/23 1408  BP: (!) 146/81 121/73  118/78  Pulse: 92 87  86  Resp: 18 18  20   Temp: 98.3 F (36.8 C) 98.2 F (36.8 C)  98.4 F (36.9 C)  TempSrc: Oral Oral  Oral  SpO2: 100% 100% 100% 100%  Weight:      Height:       Filed Weights   03/09/23 1516  Weight: 78 kg    Exam: General exam: Appears comfortable  HEENT: oral mucosa moist Respiratory system: crackles in LLL, repetitive coughing today Cardiovascular system: S1 & S2 heard  Gastrointestinal system: Abdomen soft, non-tender, nondistended. Normal bowel sounds   Extremities: No cyanosis, clubbing or edema Psychiatry:  anxiety noted      CBC: Recent Labs  Lab 03/09/23 1542 03/10/23 0522 03/11/23 0445  WBC 20.1* 15.6* 12.9*  NEUTROABS 15.9*  --   --   HGB 9.0* 8.4* 8.2*  HCT 29.5* 27.8* 27.8*  MCV 79.9* 81.5 82.7  PLT 380 356 366    Basic Metabolic Panel: Recent Labs  Lab 03/09/23 1542 03/09/23 2011 03/10/23 0522 03/11/23 0445  NA 135  --  139 139  K 3.2*  --  3.3* 4.2  CL 99  --  106 108  CO2 24  --  24 24  GLUCOSE 110*  --  148* 83  BUN 31*  --  27* 19  CREATININE 1.26*  --  1.11* 1.01*  CALCIUM 8.9  --  8.6* 8.8*  MG  --  2.0  --   --     GFR: Estimated Creatinine Clearance: 68 mL/min (A) (by C-G formula based on SCr of 1.01 mg/dL (H)).  Scheduled Meds:  aspirin EC  81 mg Oral QHS   azithromycin  500 mg Oral Daily   budesonide (PULMICORT) nebulizer solution  0.25 mg Nebulization BID   buPROPion  150 mg Oral Daily   clonazePAM  1 mg Oral TID   dronabinol  2.5 mg Oral QHS   enoxaparin (LOVENOX) injection  40 mg Subcutaneous Q24H   escitalopram  20 mg Oral QPM   guaiFENesin  1,200 mg Oral BID   hydrocortisone  20 mg Oral Daily   And   hydrocortisone  10 mg Oral QHS   ipratropium-albuterol  3 mL Nebulization Q6H   lamoTRIgine  200 mg Oral QHS   lamoTRIgine  25 mg Oral Daily   levothyroxine  100 mcg Oral Q0600   mometasone-formoterol  2 puff Inhalation BID   pantoprazole  40 mg Oral Daily   rosuvastatin  10 mg Oral QPM   saccharomyces boulardii  250 mg Oral BID   topiramate  150 mg Oral QHS   umeclidinium bromide  1 puff Inhalation Daily   Continuous Infusions:  cefTRIAXone (ROCEPHIN)  IV 2 g (03/11/23 0843)   Imaging and lab data was personally  reviewed DG Chest 2 View  Result Date: 03/09/2023 CLINICAL DATA:  Sepsis EXAM: CHEST - 2 VIEW COMPARISON:  X-ray 01/22/2023.  CT scan 01/23/2023 FINDINGS: Stable right IJ line with tip overlying the upper right atrium near the SVC margin. Bilateral axillary surgical clips. Underinflation. No pneumothorax or effusion. Developing ill-defined parenchymal interstitial opacities along the lung bases, left-greater-than-right. No pneumothorax or effusion. Stable cardiopericardial silhouette with a calcified aorta. IMPRESSION: Developing opacities along the lung bases, left-greater-than-right. Possible infiltrate. Recommend follow-up. Electronically Signed   By: Karen Kays M.D.   On: 03/09/2023 16:34    LOS: 1  day   Author: Calvert Cantor  03/11/2023 2:27 PM  To contact [facility]>   Check the care team in [facility] and look for the attending/consulting [place] provider listed  Log into [email or web address] and use [place]'s universal password   Go to> "[facility]"  and find provider  If you still have difficulty reaching the provider, please page the [facility] (Director on Call) for the Hospitalists listed on amion

## 2023-03-11 NOTE — Progress Notes (Signed)
PHARMACY - PHYSICIAN COMMUNICATION CRITICAL VALUE ALERT - BLOOD CULTURE IDENTIFICATION (BCID)  [name] is an 53 y.o. female who presented to [facility] on 03/09/2023 with a chief complaint of fever, cough, tachycardia  Assessment:  Sepsis, PNA  Name of physician (or Provider) Contacted: Rizwan via chat  Current antibiotics: Rocephin/Azithro  Changes to prescribed antibiotics recommended:  Patient is on recommended antibiotics - No changes needed  Results for orders placed or performed during the hospital encounter of 03/09/23  Blood Culture ID Panel (Reflexed) (Collected: 03/09/2023  3:47 PM)  Result Value Ref Range   Enterococcus faecalis NOT DETECTED NOT DETECTED   Enterococcus Faecium NOT DETECTED NOT DETECTED   Listeria monocytogenes NOT DETECTED NOT DETECTED   Staphylococcus species NOT DETECTED NOT DETECTED   Staphylococcus aureus (BCID) NOT DETECTED NOT DETECTED   Staphylococcus epidermidis NOT DETECTED NOT DETECTED   Staphylococcus lugdunensis NOT DETECTED NOT DETECTED   Streptococcus species DETECTED (A) NOT DETECTED   Streptococcus agalactiae NOT DETECTED NOT DETECTED   Streptococcus pneumoniae NOT DETECTED NOT DETECTED   Streptococcus pyogenes NOT DETECTED NOT DETECTED   A.calcoaceticus-baumannii NOT DETECTED NOT DETECTED   Bacteroides fragilis NOT DETECTED NOT DETECTED   Enterobacterales NOT DETECTED NOT DETECTED   Enterobacter cloacae complex NOT DETECTED NOT DETECTED   Escherichia coli NOT DETECTED NOT DETECTED   Klebsiella aerogenes NOT DETECTED NOT DETECTED   Klebsiella oxytoca NOT DETECTED NOT DETECTED   Klebsiella pneumoniae NOT DETECTED NOT DETECTED   Proteus species NOT DETECTED NOT DETECTED   Salmonella species NOT DETECTED NOT DETECTED   Serratia marcescens NOT DETECTED NOT DETECTED   Haemophilus influenzae NOT DETECTED NOT DETECTED   Neisseria meningitidis NOT DETECTED NOT DETECTED   Pseudomonas aeruginosa NOT DETECTED NOT DETECTED    Stenotrophomonas maltophilia NOT DETECTED NOT DETECTED   Candida albicans NOT DETECTED NOT DETECTED   Candida auris NOT DETECTED NOT DETECTED   Candida glabrata NOT DETECTED NOT DETECTED   Candida krusei NOT DETECTED NOT DETECTED   Candida parapsilosis NOT DETECTED NOT DETECTED   Candida tropicalis NOT DETECTED NOT DETECTED   Cryptococcus neoformans/gattii NOT DETECTED NOT DETECTED      Quinten Allerton S. Merilynn Finland, PharmD, BCPS Clinical Staff Pharmacist [email or web address] Pasty Spillers 03/11/2023  10:32 AM

## 2023-03-11 NOTE — Progress Notes (Signed)
Chaplain engaged in an initial visit with [name]. [name] shared about her healthcare journey. She detailed how her first bought with cancer was when she was 52 years old and wasn't expected to survive. She then detailed periods of remission and then finding out she had cancer again. [name] voiced, "I feel like God's punching bag." Chaplain worked to normalize her intense feelings of grief and anger. She voiced several times of not knowing why this has been her life. Chaplain provided understanding and conversation around faith in the unknown. She also stated that it has been hard to hear people talk about her positivity and strength. Chaplain commended [name] for her honesty and highlighted her ability to share her real feelings.   [name] also spent time talking about her familial relationships, as well as her spouse. [name] shared pictures of her children and stories about them. [name] and Chaplain discussed her worthiness of unconditional love and support as she became tearful thinking about those who have remained with her throughout this journey. She stated, "So many people have left."   During conversation, [name] did experience points of confusion and tiredness in which she would seemingly doze off. But she was clear about how she feels as she is in this process of dying.   Chaplain offered reflective listening, a compassionate presence, normalization of grief and emotions, and support. Chaplain will follow-up tomorrow.   Chaplain Sanam Marmo, MDiv  03/11/23 1400  Spiritual Encounters  Type of Visit Initial  Care provided to: Patient  Referral source Physician  Reason for visit Routine spiritual support  Spiritual Framework  Presenting Themes Significant life change;Rituals and practive;Community and relationships;Impactful experiences and emotions;Meaning/purpose/sources of inspiration;Goals in life/care  Interventions  Spiritual Care Interventions Made Reconciliation with self/others;Compassionate  presence;Reflective listening;Established relationship of care and support;Normalization of emotions;Supported grief process

## 2023-03-12 DIAGNOSIS — J189 Pneumonia, unspecified organism: Secondary | ICD-10-CM | POA: Diagnosis not present

## 2023-03-12 DIAGNOSIS — A419 Sepsis, unspecified organism: Secondary | ICD-10-CM | POA: Diagnosis not present

## 2023-03-12 LAB — CBC
HCT: 26.8 % — ABNORMAL LOW (ref 36.0–46.0)
Hemoglobin: 7.9 g/dL — ABNORMAL LOW (ref 12.0–15.0)
MCH: 24.2 pg — ABNORMAL LOW (ref 26.0–34.0)
MCHC: 29.5 g/dL — ABNORMAL LOW (ref 30.0–36.0)
MCV: 82.2 fL (ref 80.0–100.0)
Platelets: 332 10*3/uL (ref 150–400)
RBC: 3.26 MIL/uL — ABNORMAL LOW (ref 3.87–5.11)
RDW: 18.9 % — ABNORMAL HIGH (ref 11.5–15.5)
WBC: 11.9 10*3/uL — ABNORMAL HIGH (ref 4.0–10.5)
nRBC: 0 % (ref 0.0–0.2)

## 2023-03-12 MED ORDER — PANTOPRAZOLE SODIUM 40 MG PO TBEC
40.0000 mg | DELAYED_RELEASE_TABLET | Freq: Two times a day (BID) | ORAL | Status: DC
  Administered 2023-03-12 – 2023-03-13 (×2): 40 mg via ORAL
  Filled 2023-03-12 (×2): qty 1

## 2023-03-12 NOTE — TOC Initial Note (Signed)
Transition of Care [facility]) - Initial/Assessment Note    Patient Details  Name: [name] MRN: [id number] Date of Birth: [birth year]  Transition of Care [facility]) CM/SW Contact:    Beckie Busing, RN Phone Number:[phone]  03/12/2023, 9:45 PM  Clinical Narrative:                 TOC following patient at high risk  for readmission. Patient is from home with son and spouse. Patient normally independent with use of walker at times. Patient has PCP(  Toppin, Lillia Dallas, MD) and follows on a regular basis. Patient has access to medications and they are affordable. Patient does have home O2. Currently here are no TOC needs. TOC will continue to follow.   Expected Discharge Plan: Home/Self Care Barriers to Discharge: Continued Medical Work up   Patient Goals and CMS Choice   CMS Medicare.gov Compare Post Acute Care list provided to::  (n/a) Choice offered to / list presented to : NA [place] ownership interest in [facility].provided to::  (n/a)    Expected Discharge Plan and Services In-house Referral: NA Discharge Planning Services: CM Consult Post Acute Care Choice: NA Living arrangements for the past 2 months: Single Family Home                 DME Arranged: N/A DME Agency: NA       HH Arranged: NA HH Agency: NA        Prior Living Arrangements/Services Living arrangements for the past 2 months: Single Family Home Lives with:: Spouse, Minor Children Patient language and need for interpreter reviewed:: Yes Do you feel safe going back to the place where you live?: Yes      Need for Family Participation in Patient Care: Yes (Comment) Care giver support system in place?: Yes (comment) Current home services: DME (Home O2 and walker) Criminal Activity/Legal Involvement Pertinent to Current Situation/Hospitalization: No - Comment as needed  Activities of Daily Living Home Assistive Devices/Equipment: Oxygen, Nebulizer, Walker (specify type) ADL Screening (condition  at time of admission) Patient's cognitive ability adequate to safely complete daily activities?: Yes Is the patient deaf or have difficulty hearing?: No Does the patient have difficulty seeing, even when wearing glasses/contacts?: No Does the patient have difficulty concentrating, remembering, or making decisions?: No Patient able to express need for assistance with ADLs?: Yes Does the patient have difficulty dressing or bathing?: No Independently performs ADLs?: Yes (appropriate for developmental age) Does the patient have difficulty walking or climbing stairs?: No Weakness of Legs: None Weakness of Arms/Hands: None  Permission Sought/Granted Permission sought to share information with : Family Supports    Share Information with NAME: Kinslea Frances     Permission granted to share info w Relationship: spouse  Permission granted to share info w Contact Information: [phone]  Emotional Assessment Appearance:: Appears stated age Attitude/Demeanor/Rapport: Gracious Affect (typically observed): Anxious Orientation: : Oriented to Self, Oriented to Place, Oriented to  Time, Oriented to Situation Alcohol / Substance Use: Not Applicable Psych Involvement: No (comment)  Admission diagnosis:  Hypokalemia [E87.6] HCAP (healthcare-associated pneumonia) [J18.9] Sepsis (HCC) [A41.9] Sepsis due to pneumonia (HCC) [J18.9, A41.9] Sepsis, due to unspecified organism, unspecified whether acute organ dysfunction present (HCC) [A41.9] Lobar pneumonia (HCC) [J18.1] Patient Active Problem List   Diagnosis Date Noted   Lobar pneumonia (HCC) 03/10/2023   Severe persistent asthma 03/09/2023   CKD stage 3a, GFR 45-59 ml/min (HCC) 03/09/2023   Goals of care, counseling/discussion 01/22/2023   Microcytic  anemia 01/22/2023   Cellulitis 09/13/2022   Grade I diastolic dysfunction 09/13/2022   Excessive daytime sleepiness 08/01/2022   Community acquired pneumonia    Severe persistent asthma with acute  exacerbation    Anxiety    Anxiety and depression 04/06/2022   GERD without esophagitis 04/06/2022   Sepsis due to pneumonia (HCC) 04/06/2022   PICC (peripherally inserted central catheter) in place    Bacteremia due to Enterobacter species 03/29/2022   Normocytic anemia 03/29/2022   Major depressive disorder, recurrent episode, moderate (HCC)    Generalized anxiety disorder/depression    Seizure-like activity (HCC) 12/08/2020   High anion gap metabolic acidosis 12/08/2020   Hypokalemia 12/08/2020   Acute renal failure superimposed on stage 3a chronic kidney disease (HCC) 04/27/2020   Vasculopathy 04/27/2020   Bacteremia 04/19/2020   Addison's disease (HCC)    Chronic nausea 04/01/2020   Generalized weakness 02/10/2020   Headache 02/10/2020   Subcutaneous nodule 02/01/2020   Port-A-Cath in place 09/09/2019   Moderate persistent asthma without complication 08/09/2019   Fever 07/21/2019   Superficial thrombophlebitis 07/16/2019   Injury of left index finger 12/24/2018   History of pituitary adenoma 12/23/2018   History of cervical cancer 12/23/2018   Chronic respiratory failure with hypoxia (HCC) 12/23/2018   SOB (shortness of breath)    Nephrolithiasis 11/07/2018   Oxygen dependent 11/07/2018   Migraine 11/07/2018   PVC's (premature ventricular contractions) 10/27/2018   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   Right heart failure (HCC) 04/17/2017   Hyperlipidemia 03/09/2014   Hx of Hodgkins lymphoma    History of ductal carcinoma in situ (DCIS) of breast    History of thyroid cancer s/p thyroidectomy and post surgical hypothyroidism    Raynaud phenomenon    GERD (gastroesophageal reflux disease)    PCP:  Audery Amel, MD Pharmacy:   [id number] [place], [place] - [address] AT [facility] [address] [place] [address] Phone: [phone] Fax: [phone]  [id number] - [place], [place] - [address] AT  [facility] [address] [place] [address] Phone: [phone] Fax: [phone]     Social Determinants of Health (SDOH) Social History: SDOH Screenings   Food Insecurity: No Food Insecurity (03/09/2023)  Housing: Low Risk  (03/09/2023)  Transportation Needs: No Transportation Needs (03/09/2023)  Utilities: Not At Risk (03/09/2023)  Depression (PHQ2-9): Low Risk  (06/14/2022)  Tobacco Use: Low Risk  (03/11/2023)   SDOH Interventions:     Readmission Risk Interventions    03/12/2023    9:36 PM 09/14/2022   12:19 PM 07/02/2022    1:15 PM  Readmission Risk Prevention Plan  Transportation Screening Complete Complete Complete  PCP or Specialist Appt within 5-7 Days   Complete  Home Care Screening   Complete  Medication Review (RN CM)   Complete  Medication Review (RN Care Manager) Complete Complete   PCP or Specialist appointment within 3-5 days of discharge Complete Complete   HRI or Home Care Consult Complete Complete   SW Recovery Care/Counseling Consult Complete Complete   Palliative Care Screening Not Applicable Not Applicable   Skilled Nursing Facility Not Applicable Not Applicable

## 2023-03-12 NOTE — Progress Notes (Signed)
Chaplain and [name] were able to focus in on some things that bring her joy and healing today. [name] showed [employer] of her yard and deck. She finds great joy in nature, especially in rain. She loves to hear the sound of rain and wind chimes. [name] also has a garden and various types of flowers around her home. Chaplain recognized how [name]'s disposition changed when talking about her home and the additions they have made to their home.   [name] spent a significant amount of time thinking about fractured relationships in her life and what she desires from her children. Chaplain and [name] talked about ways of releasing the things she cannot control. Chaplain assessed that the state of certain relationships have hindered her ability to heal emotionally and spiritually. Chaplain and [name] had a healthy discussion around faith and moving forward.   Chaplain offered reflective listening, support, and a compassionate presence.     03/12/23 1600  Spiritual Encounters  Type of Visit Follow up  Care provided to: Patient  Reason for visit Routine spiritual support

## 2023-03-12 NOTE — Progress Notes (Signed)
Pt speaking with Chaplain at this time and wants to hold CPT until 2000 tx.

## 2023-03-12 NOTE — Progress Notes (Signed)
Pt wants to hold off on CPT at this time. She states she is still sore from the 0800 tx.

## 2023-03-12 NOTE — Progress Notes (Signed)
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 Note the patient is on palliative care at home having recently graduated from hospice. Patient presented to the hospital for temperature of 104 and fevers for about 1 week, increased cough over the past 1 to 2 weeks and was noted to have a temperature of 99.2 and a heart rate greater than 100. Lactic acid noted to be 3.0. WBC count 20.1. Chest x-ray revealed left greater than right lower lobe opacities. UA was not consistent with an infection.  She was given ceftriaxone, azithromycin and cefepime along with Solu-Cortef and IV fluids.   Subjective:  Has bouts of coughing still.   Assessment and Plan: Principal Problem:   Sepsis  -Felt to be due to pneumonia -COVID, influenza and respiratory panel negative - 1 set of blood culture + for strep species - susceptibilities pending but leukocytosis improving - No documented fever in the hospital but did have significant leukocytosis and has crackles in left lower lobe along with complaints of increased cough - Cont ceftriaxone and azithromycin- WBC now 11.9   Active Problems:   Moderate persistent asthma   - Advair, Spiriva, albuterol, Qvar and ProAir listed home meds-  duplicates noted -No wheezing but still had an excessive amount of cough with difficulty  bringing up sputum - added Mucinex 1200 BID, chest PT (uses vest at home) and Flutter valve - cont Pulmicort, Duonebs Q6, Dulera, Incruse Ellipta  Chronic nausea -Continue medications as she takes at home- apparently Benadryl helps  Chronic respiratory failure - Interestingly, there is no list cause for this but she is on 4 L of oxygen at baseline per last pulmonary note by Dr Everardo All - no PAH on cath in 12/23  Systolic heart failure, chronic with improvement in EF - Her last 4 echoes dating back to 2021 all reveal an EF of 50 to 55% or greater and either no or grade 1 diastolic dysfunction - cath in 12/23 revealed mild Ao stenosis and normal coronaries.  - Holding Bumex & Januvia   - resumed Entresto      Generalized anxiety disorder/depression -This appears to be quite severe -receives therapy and medication management as outpt - She takes both clonazepam for maintenance and Ativan (PRN) at home according  -In addition, she takes Wellbutrin and escitalopram  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 - Given stress dose steroids - Continue home dose of hydrocortisone    Hypokalemia - replaced    Microcytic anemia -Continue outpatient follow-up -Ferritin noted to be low normal at 17   Chronic pain? -She takes 4 mg of Dilaudid as needed as outpatient which she states she only needs occasionally       Code Status: DNR Consultants: None Level of Care: Level of care: Med-Surg Total time on patient care: 40 minutes DVT prophylaxis: Lovenox  Objective:   Vitals:   03/11/23 2059 03/12/23 0402 03/12/23 1245 03/12/23 1339  BP:  136/76 126/65  113/64  Pulse: 96 98  95  Resp:  18  20  Temp: 98.7 F (37.1 C) 98.7 F (37.1 C)  99.5 F (37.5 C)  TempSrc: Oral Oral  Oral  SpO2: 100% 99% 99% 100%  Weight:      Height:       Filed Weights   03/09/23 1516  Weight: 78 kg   Exam: General exam: Appears uncomfortable HEENT: oral mucosa  moist Respiratory system: left basilar crackles, cough  Cardiovascular system: S1 & S2 heard  Gastrointestinal system: Abdomen soft, non-tender, nondistended. Normal bowel sounds   Extremities: No cyanosis, clubbing or edema Psychiatry:  anxious and depressed      CBC: Recent Labs  Lab 03/09/23 1542 03/10/23 0522 03/11/23 0445 03/12/23 0500  WBC 20.1* 15.6* 12.9* 11.9*  NEUTROABS 15.9*  --   --   --   HGB 9.0* 8.4* 8.2* 7.9*  HCT 29.5* 27.8* 27.8* 26.8*  MCV 79.9* 81.5 82.7 82.2  PLT 380 356 366 332    Basic Metabolic Panel: Recent Labs  Lab 03/09/23 1542 03/09/23 2011 03/10/23 0522 03/11/23 0445  NA 135  --  139 139  K 3.2*  --  3.3* 4.2  CL 99  --  106 108  CO2 24  --  24 24  GLUCOSE 110*  --  148* 83  BUN 31*  --  27* 19  CREATININE 1.26*  --  1.11* 1.01*  CALCIUM 8.9  --  8.6* 8.8*  MG  --  2.0  --   --     GFR: Estimated Creatinine Clearance: 68 mL/min (A) (by C-G formula based on SCr of 1.01 mg/dL (H)).  Scheduled Meds:  aspirin EC  81 mg Oral QHS   azithromycin  500 mg Oral Daily   budesonide (PULMICORT) nebulizer solution  0.25 mg Nebulization BID   buPROPion  150 mg Oral Daily   clonazePAM  1 mg Oral TID   dronabinol  2.5 mg Oral QHS   enoxaparin (LOVENOX) injection  40 mg Subcutaneous Q24H   escitalopram  20 mg Oral QPM   guaiFENesin  1,200 mg Oral BID   hydrocortisone  20 mg Oral Daily   And   hydrocortisone  10 mg Oral QHS   ipratropium-albuterol  3 mL Nebulization Q6H   lamoTRIgine  200 mg Oral QHS   lamoTRIgine  25 mg Oral Daily   levothyroxine  100 mcg Oral Q0600   mometasone-formoterol  2 puff Inhalation BID   pantoprazole  40 mg Oral Daily   rosuvastatin  10 mg Oral QPM   saccharomyces boulardii  250 mg Oral BID   sacubitril-valsartan  1 tablet Oral BID   topiramate  150 mg Oral QHS   umeclidinium bromide  1 puff Inhalation Daily   Continuous Infusions:  cefTRIAXone (ROCEPHIN)  IV 2 g (03/12/23 1045)   Imaging and lab data  was personally reviewed No results found.  LOS: 2 days   Author: Calvert Cantor  03/12/2023 3:25 PM  To contact [facility]>   Check the care team in [facility] and look for the attending/consulting [facility] provider listed  Log into [email or web address] and use [facility]'s universal password   Go to> "[facility]"  and find provider  If you still have difficulty reaching the provider, please page the [facility] (Director on Call) for the Hospitalists listed on amion

## 2023-03-13 DIAGNOSIS — A419 Sepsis, unspecified organism: Secondary | ICD-10-CM | POA: Diagnosis not present

## 2023-03-13 DIAGNOSIS — J189 Pneumonia, unspecified organism: Secondary | ICD-10-CM | POA: Diagnosis not present

## 2023-03-13 LAB — CULTURE, BLOOD (ROUTINE X 2): Special Requests: ADEQUATE

## 2023-03-13 MED ORDER — HYDROCORTISONE 10 MG PO TABS
10.0000 mg | ORAL_TABLET | Freq: Every day | ORAL | Status: DC
  Filled 2023-03-13: qty 1

## 2023-03-13 MED ORDER — HYDROCORTISONE 10 MG PO TABS
20.0000 mg | ORAL_TABLET | Freq: Every day | ORAL | Status: DC
  Administered 2023-03-13: 20 mg via ORAL
  Filled 2023-03-13: qty 2

## 2023-03-13 MED ORDER — HEPARIN SOD (PORK) LOCK FLUSH 100 UNIT/ML IV SOLN
500.0000 [IU] | Freq: Once | INTRAVENOUS | Status: AC
  Administered 2023-03-13: 500 [IU] via INTRAVENOUS
  Filled 2023-03-13: qty 5

## 2023-03-13 NOTE — Progress Notes (Signed)
RT Note:  Patient declined CPT vest at this time. States she has her vest at home and she's potentially going home today.

## 2023-03-13 NOTE — Plan of Care (Signed)
  Problem: Respiratory: Goal: Ability to maintain a clear airway will improve Outcome: Progressing   Problem: Education: Goal: Knowledge of General Education information will improve Description: Including pain rating scale, medication(s)/side effects and non-pharmacologic comfort measures Outcome: Progressing   Problem: Health Behavior/Discharge Planning: Goal: Ability to manage health-related needs will improve Outcome: Progressing   Problem: Activity: Goal: Risk for activity intolerance will decrease Outcome: Progressing   Problem: Nutrition: Goal: Adequate nutrition will be maintained Outcome: Progressing   Problem: Coping: Goal: Level of anxiety will decrease Outcome: Progressing   Problem: Pain Managment: Goal: General experience of comfort will improve Outcome: Progressing   Problem: Safety: Goal: Ability to remain free from injury will improve Outcome: Progressing

## 2023-03-13 NOTE — Discharge Summary (Signed)
Physician Discharge Summary  [name] ZOX:[id number] DOB: 19-Aug-1971 DOA: 03/09/2023  PCP: Audery Amel, MD  Admit date: 03/09/2023 Discharge date: 03/13/2023  Admitted From: Home Disposition: Home  Recommendations for Outpatient Follow-up:  Follow up with PCP in 1-2 weeks Follow-up with pulmonology, oncology, cardiology as scheduled  Home Health: None, patient to follow-up with palliative care/hospice team Equipment/Devices: None  Discharge Condition: Stable CODE STATUS: DNR Diet recommendation: Regular diet as tolerated  Brief/Interim Summary: Patient is a 52 year old female with history of chronic respiratory failure on 4-5 L of oxygen with unknown etiology, moderate persistent eosinophilic asthma, chronic bronchitis chronic systolic congestive heart failure with improved EF, 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  Patient recently graduated from hospice to palliative care but expects to go back on hospice given worsening respiratory status as above.  Patient admitted for acute respiratory failure secondary to sepsis pneumonia, patient improved drastically over the past 48 hours, no longer wheezing, completed antibiotic course and is otherwise stable and agreeable for discharge home.  Lengthy discussion about need for close monitoring and follow-up with hospice/palliative care as well as PCP in the next 1 to 2 weeks.  Would also consider being reevaluated by pulmonology if palliative care and hospice are unable to transition her back to hospice care.  Discharge Diagnoses:  Principal Problem:   Sepsis due to pneumonia [facility]) Active Problems:   Hypokalemia   Microcytic  anemia   Chronic nausea   Severe persistent asthma   Grade I diastolic dysfunction   Addison's disease (HCC)   Chronic respiratory failure with hypoxia (HCC)   Right heart failure (HCC)   History of thyroid cancer s/p thyroidectomy and post surgical hypothyroidism   Hyperlipidemia   GERD (gastroesophageal reflux disease)   Generalized anxiety disorder/depression   CKD stage 3a, GFR 45-59 ml/min (HCC)   Lobar pneumonia (HCC)    Discharge Instructions   Allergies as of 03/13/2023       Reactions   Ferrous Bisglycinate Chelate [iron] Anaphylaxis, Other (See Comments)   Only IV - only FERRLICET   Mushroom Extract Complex Anaphylaxis   Na Ferric Gluc Cplx In Sucrose Anaphylaxis   Cymbalta [duloxetine Hcl] Swelling, Anxiety   Hydromorphone Other (See Comments)   BP drop and heart rate drops 5.1.20 PT REPORTS THAT SHE TAKES DILAUDID AT HOME   Ondansetron Hcl Other (See Comments)   Promethazine Other (See Comments)   Other reaction(s): Unknown   Succinylcholine Other (See Comments)   Lock Jaw   Buprenorphine Hcl Hives   Compazine Other (See Comments)   Altered mental status Aggression   Duloxetine    Other reaction(s): Not available   Metoclopramide Other (See Comments)   Dystonia   Morphine And Codeine Hives   Ondansetron Hives, Rash   Other reaction(s): Unknown Other reaction(s): Unknown   Promethazine Hcl Hives   Tegaderm Ag Mesh [silver] Rash   Old formulation only, is able to tolerate new formulation        Medication List     STOP taking these medications    sitaGLIPtin 100 MG tablet Commonly known as: Januvia       TAKE these medications    aspirin EC 81 MG tablet Take 1 tablet (81 mg total) by mouth daily. Swallow  whole. What changed:  when to take this additional instructions   bumetanide 1 MG tablet Commonly known as: BUMEX TAKE 1 TABLET BY MOUTH TWICE A DAY What changed: when to take this   buPROPion 150 MG 24 hr tablet Commonly known  as: WELLBUTRIN XL TAKE 1 TABLET BY MOUTH EVERY DAY   Cambia 50 MG Pack Generic drug: Diclofenac Potassium(Migraine) Take 50 mg by mouth daily as needed for migraine.   clonazePAM 1 MG tablet Commonly known as: KLONOPIN Take 1 tablet (1 mg total) by mouth 2 (two) times daily. What changed: when to take this   dexlansoprazole 60 MG capsule Commonly known as: DEXILANT Take 60 mg by mouth daily.   diphenhydrAMINE 12.5 MG/5ML elixir Commonly known as: BENADRYL Take 10 mLs (25 mg total) by mouth every 6 (six) hours as needed (nausea).   dronabinol 2.5 MG capsule Commonly known as: MARINOL TAKE 1 CAPSULE BY MOUTH TWICE A DAY AS NEEDED NAUSEA What changed: See the new instructions.   Emgality 120 MG/ML Sosy Generic drug: Galcanezumab-gnlm Inject 120 mg into the skin every 28 (twenty-eight) days.   Entresto 24-26 MG Generic drug: sacubitril-valsartan Take 1 tablet by mouth 2 (two) times daily.   EPINEPHrine 0.3 mg/0.3 mL Soaj injection Commonly known as: EPI-PEN INJECT 0.3 MG INTO THE MUSCLE AS NEEDED FOR ANAPHYLAXIS.   escitalopram 20 MG tablet Commonly known as: LEXAPRO TAKE 1 TABLET BY MOUTH EVERY DAY IN THE EVENING What changed:  how much to take how to take this   Florastor 250 MG capsule Generic drug: saccharomyces boulardii Take 250 mg by mouth 2 (two) times daily.   fluticasone-salmeterol 230-21 MCG/ACT inhaler Commonly known as: ADVAIR HFA INHALE 2 PUFFS INTO THE LUNGS TWICE A DAY What changed:  how much to take how to take this when to take this additional instructions   guaiFENesin 100 MG/5ML liquid Commonly known as: ROBITUSSIN Take 15 mLs by mouth every 6 (six) hours as needed for cough or to loosen phlegm.   Heparin Na (Pork) Lock Flsh PF 100 UNIT/ML Soln Commonly known as: BD Heparin PosiFlush USE 5 MLS IN PORT A CATH ONCE DAILY AFTER MEDICATION ADMINISTRATION AS A HEPLOCK AS DIRECTED.   hydrocortisone 10 MG tablet Commonly known as:  CORTEF Take 1-2 tablets (10-20 mg total) by mouth See admin instructions. Take 20 mg in the am and 10mg  in the evening. Take after completing prednisone   HYDROmorphone 4 MG tablet Commonly known as: Dilaudid Take 1 tablet (4 mg total) by mouth every 4 (four) hours as needed. What changed: reasons to take this   hydrOXYzine 25 MG tablet Commonly known as: ATARAX Take 1 tablet (25 mg total) by mouth every 6 (six) hours as needed for anxiety.   lamoTRIgine 200 MG tablet Commonly known as: LAMICTAL TAKE 1 TABLET BY MOUTH EVERYDAY AT BEDTIME What changed:  how much to take how to take this when to take this additional instructions   lamoTRIgine 25 MG tablet Commonly known as: LaMICtal Take 1 tablet (25 mg total) by mouth daily. What changed: Another medication with the same name was changed. Make sure you understand how and when to take each.   LORazepam 0.5 MG tablet Commonly known as: Ativan Take 1 tablet (0.5 mg total) by mouth 3 (three) times daily as needed for anxiety.   menthol-cetylpyridinium 3 MG lozenge Commonly known as: CEPACOL Take 1 lozenge (3 mg total) by mouth as needed for sore throat.   nitroGLYCERIN 0.4 MG SL tablet  Commonly known as: NITROSTAT Dissolve 1 tablet under the tongue every 5 minutes as needed for chest pain. Max of 3 doses, then 911.   Normal Saline Flush 0.9 % Soln Use as directed for central venous catheter maintenance   Normal Saline Flush 0.9 % Soln Use as directed for central venous catheter maintenance   Nurtec 75 MG Tbdp Generic drug: Rimegepant Sulfate Take 75 mg by mouth daily as needed (Migraine).   OneTouch Delica Plus Lancet33G Misc 3 (three) times daily. for testing   OneTouch Verio test strip Generic drug: glucose blood 3 (three) times daily. for testing   OXYGEN Inhale 4-5 L/min into the lungs continuous.   ProAir HFA 108 (90 Base) MCG/ACT inhaler Generic drug: albuterol Inhale 2 puffs into the lungs every 4 (four)  hours as needed for wheezing or shortness of breath.   albuterol (2.5 MG/3ML) 0.083% nebulizer solution Commonly known as: PROVENTIL Take 3 mLs (2.5 mg total) by nebulization every 6 (six) hours as needed for wheezing or shortness of breath.   Qvar RediHaler 80 MCG/ACT inhaler Generic drug: beclomethasone INHALE 1 PUFF INTO THE LUNGS TWICE A DAY What changed: See the new instructions.   rosuvastatin 10 MG tablet Commonly known as: CRESTOR Take 10 mg by mouth daily. Mid morning   silver sulfADIAZINE 1 % cream Commonly known as: SILVADENE Apply to affected area daily What changed:  how much to take how to take this when to take this reasons to take this additional instructions   Spiriva Respimat 1.25 MCG/ACT Aers Generic drug: Tiotropium Bromide Monohydrate Inhale 2 puffs into the lungs daily.   sucralfate 1 GM/10ML suspension Commonly known as: CARAFATE Take 1 g by mouth daily as needed (as directed for ulcers).   Synthroid 100 MCG tablet Generic drug: levothyroxine Take 1 tablet (100 mcg total) by mouth every morning. What changed: additional instructions   topiramate 50 MG tablet Commonly known as: TOPAMAX Take 150 mg by mouth at bedtime.   Xeroform Occlusive Gauze Strip Pads Apply 1 each topically as directed.   zolpidem 12.5 MG CR tablet Commonly known as: AMBIEN CR Take 12.5 mg by mouth at bedtime.        Allergies  Allergen Reactions   Ferrous Bisglycinate Chelate [Iron] Anaphylaxis and Other (See Comments)    Only IV - only FERRLICET   Mushroom Extract Complex Anaphylaxis   Na Ferric Gluc Cplx In Sucrose Anaphylaxis   Cymbalta [Duloxetine Hcl] Swelling and Anxiety   Hydromorphone Other (See Comments)    BP drop and heart rate drops 5.1.20 PT REPORTS THAT SHE TAKES DILAUDID AT HOME   Ondansetron Hcl Other (See Comments)   Promethazine Other (See Comments)    Other reaction(s): Unknown   Succinylcholine Other (See Comments)    Lock Jaw    Buprenorphine Hcl Hives   Compazine Other (See Comments)    Altered mental status Aggression   Duloxetine     Other reaction(s): Not available   Metoclopramide Other (See Comments)    Dystonia   Morphine And Codeine Hives   Ondansetron Hives and Rash    Other reaction(s): Unknown Other reaction(s): Unknown   Promethazine Hcl Hives   Tegaderm Ag Mesh [Silver] Rash    Old formulation only, is able to tolerate new formulation    Consultations: None  Procedures/Studies: DG Chest 2 View  Result Date: 03/09/2023 CLINICAL DATA:  Sepsis EXAM: CHEST - 2 VIEW COMPARISON:  X-ray 01/22/2023.  CT scan 01/23/2023 FINDINGS: Stable right IJ line with  tip overlying the upper right atrium near the SVC margin. Bilateral axillary surgical clips. Underinflation. No pneumothorax or effusion. Developing ill-defined parenchymal interstitial opacities along the lung bases, left-greater-than-right. No pneumothorax or effusion. Stable cardiopericardial silhouette with a calcified aorta. IMPRESSION: Developing opacities along the lung bases, left-greater-than-right. Possible infiltrate. Recommend follow-up. Electronically Signed   By: Karen Kays M.D.   On: 03/09/2023 16:34     Subjective: No acute issues or events overnight, respiratory status markedly improved denies nausea vomiting diarrhea constipation fevers chills chest pain, shortness of breath ongoing but reported to be at baseline   Discharge Exam: Vitals:   03/12/23 2031 03/13/23 0515  BP:  115/60  Pulse:  96  Resp: 16 18  Temp:  98.4 F (36.9 C)  SpO2:  99%   Vitals:   03/12/23 1947 03/12/23 2013 03/12/23 2031 03/13/23 0515  BP:  130/76  115/60  Pulse:  95  96  Resp:  18 16 18   Temp:  98.6 F (37 C)  98.4 F (36.9 C)  TempSrc:  Oral  Oral  SpO2: 99% 100%  99%  Weight:      Height:        General: Pt is alert, awake, not in acute distress Cardiovascular: RRR, S1/S2 +, no rubs, no gallops Respiratory: CTA bilaterally, no wheezing,  no rhonchi Abdominal: Soft, NT, ND, bowel sounds + Extremities: no edema, no cyanosis  The results of significant diagnostics from this hospitalization (including imaging, microbiology, ancillary and laboratory) are listed below for reference.     Microbiology: Recent Results (from the past 240 hour(s))  Resp panel by RT-PCR (RSV, Flu A&B, Covid) Anterior Nasal Swab     Status: None   Collection Time: 03/09/23  3:42 PM   Specimen: Anterior Nasal Swab  Result Value Ref Range Status   SARS Coronavirus 2 by RT PCR NEGATIVE NEGATIVE Final    Comment: (NOTE) SARS-CoV-2 target nucleic acids are NOT DETECTED.  The SARS-CoV-2 RNA is generally detectable in upper respiratory specimens during the acute phase of infection. The lowest concentration of SARS-CoV-2 viral copies this assay can detect is 138 copies/mL. A negative result does not preclude SARS-Cov-2 infection and should not be used as the sole basis for treatment or other patient management decisions. A negative result may occur with  improper specimen collection/handling, submission of specimen other than nasopharyngeal swab, presence of viral mutation(s) within the areas targeted by this assay, and inadequate number of viral copies(<138 copies/mL). A negative result must be combined with clinical observations, patient history, and epidemiological information. The expected result is Negative.  Fact Sheet for Patients:  BloggerCourse.com  Fact Sheet for Healthcare Providers:  SeriousBroker.it  This test is no t yet approved or cleared by the [place] FDA and  has been authorized for detection and/or diagnosis of SARS-CoV-2 by FDA under an Emergency Use Authorization (EUA). This EUA will remain  in effect (meaning this test can be used) for the duration of the COVID-19 declaration under Section 564(b)(1) of the Act, 21 U.S.C.section 360bbb-3(b)(1), unless the authorization  is terminated  or revoked sooner.       Influenza A by PCR NEGATIVE NEGATIVE Final   Influenza B by PCR NEGATIVE NEGATIVE Final    Comment: (NOTE) The Xpert Xpress SARS-CoV-2/FLU/RSV plus assay is intended as an aid in the diagnosis of influenza from Nasopharyngeal swab specimens and should not be used as a sole basis for treatment. Nasal washings and aspirates are unacceptable for Xpert Xpress SARS-CoV-2/FLU/RSV testing.  Fact Sheet for Patients: BloggerCourse.com  Fact Sheet for Healthcare Providers: SeriousBroker.it  This test is not yet approved or cleared by the [place] FDA and has been authorized for detection and/or diagnosis of SARS-CoV-2 by FDA under an Emergency Use Authorization (EUA). This EUA will remain in effect (meaning this test can be used) for the duration of the COVID-19 declaration under Section 564(b)(1) of the Act, 21 U.S.C. section 360bbb-3(b)(1), unless the authorization is terminated or revoked.     Resp Syncytial Virus by PCR NEGATIVE NEGATIVE Final    Comment: (NOTE) Fact Sheet for Patients: BloggerCourse.com  Fact Sheet for Healthcare Providers: SeriousBroker.it  This test is not yet approved or cleared by the [place] FDA and has been authorized for detection and/or diagnosis of SARS-CoV-2 by FDA under an Emergency Use Authorization (EUA). This EUA will remain in effect (meaning this test can be used) for the duration of the COVID-19 declaration under Section 564(b)(1) of the Act, 21 U.S.C. section 360bbb-3(b)(1), unless the authorization is terminated or revoked.  Performed at [facility], [address], [place], [place] [address]   Blood Culture (routine x 2)     Status: None (Preliminary result)   Collection Time: 03/09/23  3:42 PM   Specimen: BLOOD  Result Value Ref Range Status   Specimen Description    Final    BLOOD PORTA CATH Performed at [facility], [address] [place], [place] [address]    Special Requests   Final    BOTTLES DRAWN AEROBIC AND ANAEROBIC Blood Culture adequate volume Performed at [facility], [address], [place], [place] [address]    Culture   Final    NO GROWTH 4 DAYS Performed at [facility], [address], [place], [place] [address]    Report Status PENDING  Incomplete  Blood Culture (routine x 2)     Status: Abnormal   Collection Time: 03/09/23  3:47 PM   Specimen: BLOOD  Result Value Ref Range Status   Specimen Description   Final    BLOOD PORTA CATH Performed at [facility], [address], [place], [place] [address]    Special Requests   Final    BOTTLES DRAWN AEROBIC AND ANAEROBIC Blood Culture adequate volume Performed at [facility], [address], [place], [place] [address]    Culture  Setup Time   Final    GRAM POSITIVE COCCI AEROBIC BOTTLE ONLY CRITICAL RESULT CALLED TO, READ BACK BY AND VERIFIED WITH: PHARMD JUSTIN L [id number] FCP Performed at [facility], [address] [place], [place] [address]    Culture VIRIDANS STREPTOCOCCUS (A)  Final   Report Status 03/13/2023 FINAL  Final   Organism ID, Bacteria VIRIDANS STREPTOCOCCUS  Final      Susceptibility   Viridans streptococcus - MIC*    PENICILLIN <=0.06 SENSITIVE Sensitive     CEFTRIAXONE <=0.12 SENSITIVE Sensitive     ERYTHROMYCIN <=0.12 SENSITIVE Sensitive     LEVOFLOXACIN 1 SENSITIVE Sensitive     VANCOMYCIN 1 SENSITIVE Sensitive     * VIRIDANS STREPTOCOCCUS  Blood Culture ID Panel (Reflexed)     Status: Abnormal   Collection Time: 03/09/23  3:47 PM  Result Value Ref Range Status   Enterococcus faecalis NOT DETECTED NOT DETECTED Final   Enterococcus Faecium NOT DETECTED NOT DETECTED Final   Listeria monocytogenes NOT DETECTED NOT DETECTED Final   Staphylococcus species NOT  DETECTED  NOT DETECTED Final   Staphylococcus aureus (BCID) NOT DETECTED NOT DETECTED Final   Staphylococcus epidermidis NOT DETECTED NOT DETECTED Final   Staphylococcus lugdunensis NOT DETECTED NOT DETECTED Final   Streptococcus species DETECTED (A) NOT DETECTED Final    Comment: Not Enterococcus species, Streptococcus agalactiae, Streptococcus pyogenes, or Streptococcus pneumoniae. CRITICAL RESULT CALLED TO, READ BACK BY AND VERIFIED WITH: PHARMD JUSTIN L [id number] FCP    Streptococcus agalactiae NOT DETECTED NOT DETECTED Final   Streptococcus pneumoniae NOT DETECTED NOT DETECTED Final   Streptococcus pyogenes NOT DETECTED NOT DETECTED Final   A.calcoaceticus-baumannii NOT DETECTED NOT DETECTED Final   Bacteroides fragilis NOT DETECTED NOT DETECTED Final   Enterobacterales NOT DETECTED NOT DETECTED Final   Enterobacter cloacae complex NOT DETECTED NOT DETECTED Final   Escherichia coli NOT DETECTED NOT DETECTED Final   Klebsiella aerogenes NOT DETECTED NOT DETECTED Final   Klebsiella oxytoca NOT DETECTED NOT DETECTED Final   Klebsiella pneumoniae NOT DETECTED NOT DETECTED Final   Proteus species NOT DETECTED NOT DETECTED Final   Salmonella species NOT DETECTED NOT DETECTED Final   Serratia marcescens NOT DETECTED NOT DETECTED Final   Haemophilus influenzae NOT DETECTED NOT DETECTED Final   Neisseria meningitidis NOT DETECTED NOT DETECTED Final   Pseudomonas aeruginosa NOT DETECTED NOT DETECTED Final   Stenotrophomonas maltophilia NOT DETECTED NOT DETECTED Final   Candida albicans NOT DETECTED NOT DETECTED Final   Candida auris NOT DETECTED NOT DETECTED Final   Candida glabrata NOT DETECTED NOT DETECTED Final   Candida krusei NOT DETECTED NOT DETECTED Final   Candida parapsilosis NOT DETECTED NOT DETECTED Final   Candida tropicalis NOT DETECTED NOT DETECTED Final   Cryptococcus neoformans/gattii NOT DETECTED NOT DETECTED Final    Comment: Performed at [facility], [address], [place], [place] [address]  Respiratory (~20 pathogens) panel by PCR     Status: None   Collection Time: 03/09/23  4:56 PM   Specimen: Nasopharyngeal Swab; Respiratory  Result Value Ref Range Status   Adenovirus NOT DETECTED NOT DETECTED Final   Coronavirus 229E NOT DETECTED NOT DETECTED Final    Comment: (NOTE) The Coronavirus on the Respiratory Panel, DOES NOT test for the novel  Coronavirus (2019 nCoV)    Coronavirus HKU1 NOT DETECTED NOT DETECTED Final   Coronavirus NL63 NOT DETECTED NOT DETECTED Final   Coronavirus OC43 NOT DETECTED NOT DETECTED Final   Metapneumovirus NOT DETECTED NOT DETECTED Final   Rhinovirus / Enterovirus NOT DETECTED NOT DETECTED Final   Influenza A NOT DETECTED NOT DETECTED Final   Influenza B NOT DETECTED NOT DETECTED Final   Parainfluenza Virus 1 NOT DETECTED NOT DETECTED Final   Parainfluenza Virus 2 NOT DETECTED NOT DETECTED Final   Parainfluenza Virus 3 NOT DETECTED NOT DETECTED Final   Parainfluenza Virus 4 NOT DETECTED NOT DETECTED Final   Respiratory Syncytial Virus NOT DETECTED NOT DETECTED Final   Bordetella pertussis NOT DETECTED NOT DETECTED Final   Bordetella Parapertussis NOT DETECTED NOT DETECTED Final   Chlamydophila pneumoniae NOT DETECTED NOT DETECTED Final   Mycoplasma pneumoniae NOT DETECTED NOT DETECTED Final    Comment: Performed at [facility], [address], [place], [place] [address]  Expectorated Sputum Assessment w Gram Stain, Rflx to Resp Cult     Status: None   Collection Time: 03/09/23  6:25 PM   Specimen: Sputum  Result Value Ref Range Status   Specimen Description SPUTUM  Final   Special Requests NONE  Final  Sputum evaluation   Final    THIS SPECIMEN IS ACCEPTABLE FOR SPUTUM CULTURE Performed at [facility], [address] [place], [place] [address]    Report Status 03/09/2023 FINAL  Final  Culture, Respiratory w Gram Stain     Status: None   Collection Time: 03/09/23  6:25 PM    Specimen: SPU  Result Value Ref Range Status   Specimen Description   Final    SPUTUM Performed at [facility], [address], [place], [place] [address]    Special Requests   Final    NONE Reflexed from [id number] Performed at [facility], [address] [place], [place] [address]    Gram Stain   Final    RARE WBC PRESENT,BOTH PMN AND MONONUCLEAR MODERATE GRAM POSITIVE COCCI IN PAIRS FEW GRAM POSITIVE RODS MODERATE GRAM POSITIVE COCCI IN CHAINS    Culture   Final    MODERATE Normal respiratory flora-no Staph aureus or Pseudomonas seen Performed at [facility], [address], [place], [place] [address]    Report Status 03/11/2023 FINAL  Final  MRSA Next Gen by PCR, Nasal     Status: None   Collection Time: 03/10/23  5:30 AM   Specimen: Nasal Mucosa; Nasal Swab  Result Value Ref Range Status   MRSA by PCR Next Gen NOT DETECTED NOT DETECTED Final    Comment: (NOTE) The GeneXpert MRSA Assay (FDA approved for NASAL specimens only), is one component of a comprehensive MRSA colonization surveillance program. It is not intended to diagnose MRSA infection nor to guide or monitor treatment for MRSA infections. Test performance is not FDA approved in patients less than 44 years old. Performed at [facility], [address] [place], [place] [address]      Labs: BNP (last 3 results) Recent Labs    07/01/22 1257  BNP 63.5   Basic Metabolic Panel: Recent Labs  Lab 03/09/23 1542 03/09/23 2011 03/10/23 0522 03/11/23 0445  NA 135  --  139 139  K 3.2*  --  3.3* 4.2  CL 99  --  106 108  CO2 24  --  24 24  GLUCOSE 110*  --  148* 83  BUN 31*  --  27* 19  CREATININE 1.26*  --  1.11* 1.01*  CALCIUM 8.9  --  8.6* 8.8*  MG  --  2.0  --   --    Liver Function Tests: Recent Labs  Lab 03/09/23 1542  AST 22  ALT 36  ALKPHOS 55  BILITOT 0.4  PROT 7.3  ALBUMIN 3.6   No results for input(s): "LIPASE", "AMYLASE"  in the last 168 hours. No results for input(s): "AMMONIA" in the last 168 hours. CBC: Recent Labs  Lab 03/09/23 1542 03/10/23 0522 03/11/23 0445 03/12/23 0500  WBC 20.1* 15.6* 12.9* 11.9*  NEUTROABS 15.9*  --   --   --   HGB 9.0* 8.4* 8.2* 7.9*  HCT 29.5* 27.8* 27.8* 26.8*  MCV 79.9* 81.5 82.7 82.2  PLT 380 356 366 332   Cardiac Enzymes: No results for input(s): "CKTOTAL", "CKMB", "CKMBINDEX", "TROPONINI" in the last 168 hours. BNP: Invalid input(s): "POCBNP" CBG: No results for input(s): "GLUCAP" in the last 168 hours. D-Dimer No results for input(s): "DDIMER" in the last 72 hours. Hgb A1c No results for input(s): "HGBA1C" in the last 72 hours. Lipid Profile No results for input(s): "CHOL", "HDL", "LDLCALC", "TRIG", "CHOLHDL", "LDLDIRECT" in the last 72  hours. Thyroid function studies No results for input(s): "TSH", "T4TOTAL", "T3FREE", "THYROIDAB" in the last 72 hours.  Invalid input(s): "FREET3" Anemia work up No results for input(s): "VITAMINB12", "FOLATE", "FERRITIN", "TIBC", "IRON", "RETICCTPCT" in the last 72 hours. Urinalysis    Component Value Date/Time   COLORURINE STRAW (A) 03/09/2023 1542   APPEARANCEUR CLEAR 03/09/2023 1542   LABSPEC 1.008 03/09/2023 1542   PHURINE 5.0 03/09/2023 1542   GLUCOSEU NEGATIVE 03/09/2023 1542   HGBUR NEGATIVE 03/09/2023 1542   BILIRUBINUR NEGATIVE 03/09/2023 1542   KETONESUR NEGATIVE 03/09/2023 1542   PROTEINUR NEGATIVE 03/09/2023 1542   UROBILINOGEN 1.0 06/16/2015 1045   NITRITE NEGATIVE 03/09/2023 1542   LEUKOCYTESUR NEGATIVE 03/09/2023 1542   Sepsis Labs Recent Labs  Lab 03/09/23 1542 03/10/23 0522 03/11/23 0445 03/12/23 0500  WBC 20.1* 15.6* 12.9* 11.9*   Microbiology Recent Results (from the past 240 hour(s))  Resp panel by RT-PCR (RSV, Flu A&B, Covid) Anterior Nasal Swab     Status: None   Collection Time: 03/09/23  3:42 PM   Specimen: Anterior Nasal Swab  Result Value Ref Range Status   SARS Coronavirus 2  by RT PCR NEGATIVE NEGATIVE Final    Comment: (NOTE) SARS-CoV-2 target nucleic acids are NOT DETECTED.  The SARS-CoV-2 RNA is generally detectable in upper respiratory specimens during the acute phase of infection. The lowest concentration of SARS-CoV-2 viral copies this assay can detect is 138 copies/mL. A negative result does not preclude SARS-Cov-2 infection and should not be used as the sole basis for treatment or other patient management decisions. A negative result may occur with  improper specimen collection/handling, submission of specimen other than nasopharyngeal swab, presence of viral mutation(s) within the areas targeted by this assay, and inadequate number of viral copies(<138 copies/mL). A negative result must be combined with clinical observations, patient history, and epidemiological information. The expected result is Negative.  Fact Sheet for Patients:  BloggerCourse.com  Fact Sheet for Healthcare Providers:  SeriousBroker.it  This test is no t yet approved or cleared by the [place] FDA and  has been authorized for detection and/or diagnosis of SARS-CoV-2 by FDA under an Emergency Use Authorization (EUA). This EUA will remain  in effect (meaning this test can be used) for the duration of the COVID-19 declaration under Section 564(b)(1) of the Act, 21 U.S.C.section 360bbb-3(b)(1), unless the authorization is terminated  or revoked sooner.       Influenza A by PCR NEGATIVE NEGATIVE Final   Influenza B by PCR NEGATIVE NEGATIVE Final    Comment: (NOTE) The Xpert Xpress SARS-CoV-2/FLU/RSV plus assay is intended as an aid in the diagnosis of influenza from Nasopharyngeal swab specimens and should not be used as a sole basis for treatment. Nasal washings and aspirates are unacceptable for Xpert Xpress SARS-CoV-2/FLU/RSV testing.  Fact Sheet for Patients: BloggerCourse.com  Fact  Sheet for Healthcare Providers: SeriousBroker.it  This test is not yet approved or cleared by the [place] FDA and has been authorized for detection and/or diagnosis of SARS-CoV-2 by FDA under an Emergency Use Authorization (EUA). This EUA will remain in effect (meaning this test can be used) for the duration of the COVID-19 declaration under Section 564(b)(1) of the Act, 21 U.S.C. section 360bbb-3(b)(1), unless the authorization is terminated or revoked.     Resp Syncytial Virus by PCR NEGATIVE NEGATIVE Final    Comment: (NOTE) Fact Sheet for Patients: BloggerCourse.com  Fact Sheet for Healthcare Providers: SeriousBroker.it  This test is not yet approved or cleared by the  [place] [employer] trader and has been authorized for detection and/or diagnosis of SARS-CoV-2 by FDA under an TEFL teacher (EUA). This EUA will remain in effect (meaning this test can be used) for the duration of the COVID-19 declaration under Section 564(b)(1) of the Act, 21 U.S.C. section 360bbb-3(b)(1), unless the authorization is terminated or revoked.  Performed at [facility], [address], [place], [place] [address]   Blood Culture (routine x 2)     Status: None (Preliminary result)   Collection Time: 03/09/23  3:42 PM   Specimen: BLOOD  Result Value Ref Range Status   Specimen Description   Final    BLOOD PORTA CATH Performed at [facility], [address] [place], [place] [address]    Special Requests   Final    BOTTLES DRAWN AEROBIC AND ANAEROBIC Blood Culture adequate volume Performed at [facility] [address], [place], [place] [address]    Culture   Final    NO GROWTH 4 DAYS Performed at [facility], [address], [place], [place] [address]    Report Status PENDING  Incomplete  Blood Culture (routine x 2)     Status: Abnormal    Collection Time: 03/09/23  3:47 PM   Specimen: BLOOD  Result Value Ref Range Status   Specimen Description   Final    BLOOD PORTA CATH Performed at [facility], [address] [place], [place] [address]    Special Requests   Final    BOTTLES DRAWN AEROBIC AND ANAEROBIC Blood Culture adequate volume Performed at [facility], [address], [place], [place] [address]    Culture  Setup Time   Final    GRAM POSITIVE COCCI AEROBIC BOTTLE ONLY CRITICAL RESULT CALLED TO, READ BACK BY AND VERIFIED WITH: PHARMD JUSTIN L [phone] FCP Performed at [facility], [address], [place], [place] [address]    Culture VIRIDANS STREPTOCOCCUS (A)  Final   Report Status 03/13/2023 FINAL  Final   Organism ID, Bacteria VIRIDANS STREPTOCOCCUS  Final      Susceptibility   Viridans streptococcus - MIC*    PENICILLIN <=0.06 SENSITIVE Sensitive     CEFTRIAXONE <=0.12 SENSITIVE Sensitive     ERYTHROMYCIN <=0.12 SENSITIVE Sensitive     LEVOFLOXACIN 1 SENSITIVE Sensitive     VANCOMYCIN 1 SENSITIVE Sensitive     * VIRIDANS STREPTOCOCCUS  Blood Culture ID Panel (Reflexed)     Status: Abnormal   Collection Time: 03/09/23  3:47 PM  Result Value Ref Range Status   Enterococcus faecalis NOT DETECTED NOT DETECTED Final   Enterococcus Faecium NOT DETECTED NOT DETECTED Final   Listeria monocytogenes NOT DETECTED NOT DETECTED Final   Staphylococcus species NOT DETECTED NOT DETECTED Final   Staphylococcus aureus (BCID) NOT DETECTED NOT DETECTED Final   Staphylococcus epidermidis NOT DETECTED NOT DETECTED Final   Staphylococcus lugdunensis NOT DETECTED NOT DETECTED Final   Streptococcus species DETECTED (A) NOT DETECTED Final    Comment: Not Enterococcus species, Streptococcus agalactiae, Streptococcus pyogenes, or Streptococcus pneumoniae. CRITICAL RESULT CALLED TO, READ BACK BY AND VERIFIED WITH: PHARMD JUSTIN L [id number] FCP    Streptococcus agalactiae  NOT DETECTED NOT DETECTED Final   Streptococcus pneumoniae NOT DETECTED NOT DETECTED Final   Streptococcus pyogenes NOT DETECTED NOT DETECTED Final   A.calcoaceticus-baumannii NOT DETECTED NOT DETECTED Final   Bacteroides fragilis NOT DETECTED NOT DETECTED Final   Enterobacterales NOT DETECTED  NOT DETECTED Final   Enterobacter cloacae complex NOT DETECTED NOT DETECTED Final   Escherichia coli NOT DETECTED NOT DETECTED Final   Klebsiella aerogenes NOT DETECTED NOT DETECTED Final   Klebsiella oxytoca NOT DETECTED NOT DETECTED Final   Klebsiella pneumoniae NOT DETECTED NOT DETECTED Final   Proteus species NOT DETECTED NOT DETECTED Final   Salmonella species NOT DETECTED NOT DETECTED Final   Serratia marcescens NOT DETECTED NOT DETECTED Final   Haemophilus influenzae NOT DETECTED NOT DETECTED Final   Neisseria meningitidis NOT DETECTED NOT DETECTED Final   Pseudomonas aeruginosa NOT DETECTED NOT DETECTED Final   Stenotrophomonas maltophilia NOT DETECTED NOT DETECTED Final   Candida albicans NOT DETECTED NOT DETECTED Final   Candida auris NOT DETECTED NOT DETECTED Final   Candida glabrata NOT DETECTED NOT DETECTED Final   Candida krusei NOT DETECTED NOT DETECTED Final   Candida parapsilosis NOT DETECTED NOT DETECTED Final   Candida tropicalis NOT DETECTED NOT DETECTED Final   Cryptococcus neoformans/gattii NOT DETECTED NOT DETECTED Final    Comment: Performed at [facility], [address], [place], [place] [address]  Respiratory (~20 pathogens) panel by PCR     Status: None   Collection Time: 03/09/23  4:56 PM   Specimen: Nasopharyngeal Swab; Respiratory  Result Value Ref Range Status   Adenovirus NOT DETECTED NOT DETECTED Final   Coronavirus 229E NOT DETECTED NOT DETECTED Final    Comment: (NOTE) The Coronavirus on the Respiratory Panel, DOES NOT test for the novel  Coronavirus (2019 nCoV)    Coronavirus HKU1 NOT DETECTED NOT DETECTED Final   Coronavirus NL63 NOT DETECTED NOT  DETECTED Final   Coronavirus OC43 NOT DETECTED NOT DETECTED Final   Metapneumovirus NOT DETECTED NOT DETECTED Final   Rhinovirus / Enterovirus NOT DETECTED NOT DETECTED Final   Influenza A NOT DETECTED NOT DETECTED Final   Influenza B NOT DETECTED NOT DETECTED Final   Parainfluenza Virus 1 NOT DETECTED NOT DETECTED Final   Parainfluenza Virus 2 NOT DETECTED NOT DETECTED Final   Parainfluenza Virus 3 NOT DETECTED NOT DETECTED Final   Parainfluenza Virus 4 NOT DETECTED NOT DETECTED Final   Respiratory Syncytial Virus NOT DETECTED NOT DETECTED Final   Bordetella pertussis NOT DETECTED NOT DETECTED Final   Bordetella Parapertussis NOT DETECTED NOT DETECTED Final   Chlamydophila pneumoniae NOT DETECTED NOT DETECTED Final   Mycoplasma pneumoniae NOT DETECTED NOT DETECTED Final    Comment: Performed at [facility], [address] [place], [place] [address]  Expectorated Sputum Assessment w Gram Stain, Rflx to Resp Cult     Status: None   Collection Time: 03/09/23  6:25 PM   Specimen: Sputum  Result Value Ref Range Status   Specimen Description SPUTUM  Final   Special Requests NONE  Final   Sputum evaluation   Final    THIS SPECIMEN IS ACCEPTABLE FOR SPUTUM CULTURE Performed at [facility], [address] [place], [place] [address]    Report Status 03/09/2023 FINAL  Final  Culture, Respiratory w Gram Stain     Status: None   Collection Time: 03/09/23  6:25 PM   Specimen: SPU  Result Value Ref Range Status   Specimen Description   Final    SPUTUM Performed at [facility], [address] [place], [place] [address]    Special Requests   Final    NONE Reflexed from [phone] Performed at [facility], [address] [place], [place]  [address]    Gram Stain   Final    RARE WBC PRESENT,BOTH PMN AND MONONUCLEAR MODERATE GRAM POSITIVE COCCI IN PAIRS FEW GRAM POSITIVE RODS MODERATE GRAM POSITIVE COCCI IN CHAINS    Culture    Final    MODERATE Normal respiratory flora-no Staph aureus or Pseudomonas seen Performed at [facility], [address], [place], [place] [address]    Report Status 03/11/2023 FINAL  Final  MRSA Next Gen by PCR, Nasal     Status: None   Collection Time: 03/10/23  5:30 AM   Specimen: Nasal Mucosa; Nasal Swab  Result Value Ref Range Status   MRSA by PCR Next Gen NOT DETECTED NOT DETECTED Final    Comment: (NOTE) The GeneXpert MRSA Assay (FDA approved for NASAL specimens only), is one component of a comprehensive MRSA colonization surveillance program. It is not intended to diagnose MRSA infection nor to guide or monitor treatment for MRSA infections. Test performance is not FDA approved in patients less than 74 years old. Performed at [facility], [address] [place], [place] [address]      Time coordinating discharge: Over 30 minutes  SIGNED:   Azucena Fallen, DO [facility] 03/13/2023, 12:18 PM Pager   If 7PM-7AM, please contact night-coverage [email or web address]

## 2023-03-13 NOTE — Progress Notes (Signed)
Patient walked the entire length of the hallway back and forth - on her baseline 5 liter oxygen. There was no dizziness, shortness of breath or dyspnea while walking.  Sitting in chair now talking and eating.

## 2023-03-14 LAB — CULTURE, BLOOD (ROUTINE X 2)
Culture: NO GROWTH
Special Requests: ADEQUATE

## 2023-03-15 ENCOUNTER — Other Ambulatory Visit: Payer: Self-pay | Admitting: Adult Health

## 2023-03-18 ENCOUNTER — Ambulatory Visit: Payer: Medicare Other | Admitting: Psychiatry

## 2023-03-18 NOTE — Progress Notes (Signed)
Patient had to cancel appt today.

## 2023-03-19 ENCOUNTER — Other Ambulatory Visit: Payer: Self-pay | Admitting: Adult Health

## 2023-03-19 ENCOUNTER — Other Ambulatory Visit: Payer: BC Managed Care – PPO

## 2023-03-19 DIAGNOSIS — F411 Generalized anxiety disorder: Secondary | ICD-10-CM

## 2023-03-19 DIAGNOSIS — Z515 Encounter for palliative care: Secondary | ICD-10-CM

## 2023-03-20 NOTE — Progress Notes (Signed)
PATIENT NAME: [name] DOB: 07-Nov-1970 MRN: [id number]  PRIMARY CARE PROVIDER: Audery Amel, MD  RESPONSIBLE PARTY:  Acct ID - Guarantor Home Phone Work Phone Relationship Acct Type  [id number] [facility]* [phone]  Self P/F     [address], [place], [place] [address]    I connected with Richard-spouse Clement Sayres on 03/20/23 by telephone and verified that I am speaking with the correct person using two identifiers.   I discussed the limitations of evaluation and management by telemedicine. The patient expressed understanding and agreed to proceed.   Connected with Richard-spouse regarding recent hospitalization and need for follow up visit.  Patient will be seen on Thursday.  Spouse provided update on changes within patient.  Discussed hospice support and possible referral.  Patient and spouse are open to hospice support if patient qualifies.     Truitt Merle, RN

## 2023-03-21 ENCOUNTER — Other Ambulatory Visit: Payer: Medicare Other

## 2023-03-21 VITALS — HR 90 | Temp 97.4°F

## 2023-03-21 DIAGNOSIS — Z515 Encounter for palliative care: Secondary | ICD-10-CM

## 2023-03-21 NOTE — Progress Notes (Signed)
PATIENT NAME: YURIKO PORTALES DOB: August 23, 1971 MRN: [id number]  PRIMARY CARE PROVIDER: Audery Amel, MD  RESPONSIBLE PARTY:  Acct ID - Guarantor Home Phone Work Phone Relationship Acct Type  [id number] [facility]* [phone]  Self P/F     [address], [place], [place] [address]   Home visit completed with patient and spouse.   GOC:  Patient is a DNR/DNI.  Her goal is comfort with symptom management.  She would prefer to avoid hospitalizations and be managed in her home.   Hospitalizations:  03/09/23-03/13/23 for sepsis with pneumonia. 01/21/22-01/24/23 for afebrile illness.   Functional Status:  Remains ambulatory for short distances but reports significant fatigue and weakness with any activity.  Has a walker she can use outside of the home.  Patient denies any falls.  Now requires assistance from her husband to shower and dress.  Remains continent of bowel and bladder.    Weight loss:  Hospice discharge weight noted at 184 lbs.  Last hospitalization weight 172 lbs.  Patient feels she maybe closer to 170 lbs now but has not weighed herself recently.  Mostly drinking fluids such as protein shakes, milkshakes, and popsicles.  Will eat yogurt and on rare occasion bites of a sandwich.   Hospice vs Palliative Care:  Discussed PC changes with patient and spouse.  Revisited hospice services and patient remains open to services if appropriate.   03/22/23- Contacted PCP to advise of recent hospitalization and request for hospice referral.    CODE STATUS: DNR ADVANCED DIRECTIVES:  MOST FORM: No PPS: 40%   PHYSICAL EXAM:   VITALS: Today's Vitals   03/21/23 1524  Pulse: 90  Temp: (!) 97.4 F (36.3 C)  SpO2: 98%    LUNGS: clear to auscultation  CARDIAC: Cor RRR}  EXTREMITIES: - for edema SKIN: Skin color, texture, turgor normal. No rashes or lesions or mobility and turgor normal  NEURO: positive for gait problems and weakness       Truitt Merle, RN

## 2023-03-22 ENCOUNTER — Other Ambulatory Visit (HOSPITAL_COMMUNITY): Payer: Self-pay

## 2023-03-25 ENCOUNTER — Ambulatory Visit (INDEPENDENT_AMBULATORY_CARE_PROVIDER_SITE_OTHER): Payer: BC Managed Care – PPO | Admitting: Psychiatry

## 2023-03-25 ENCOUNTER — Other Ambulatory Visit (HOSPITAL_COMMUNITY): Payer: Self-pay | Admitting: Endocrinology

## 2023-03-25 DIAGNOSIS — F331 Major depressive disorder, recurrent, moderate: Secondary | ICD-10-CM | POA: Diagnosis not present

## 2023-03-25 DIAGNOSIS — I878 Other specified disorders of veins: Secondary | ICD-10-CM

## 2023-03-25 NOTE — Progress Notes (Deleted)
Patient ID: [name], female   DOB: July 13, 1971, 52 y.o.   MRN: [id number]

## 2023-03-25 NOTE — Progress Notes (Signed)
[facility] Counselor/Therapist Progress Note  Patient ID: [name], MRN: [id number],    Date: 03/25/2023  Time Spent: 50 minutes   Treatment Type: Individual Therapy  Virtual Visit via Telehealth Note: MyChart Video Connected with patient by a telemedicine/telehealth application, with their informed consent, and verified patient privacy and that I am speaking with the correct person using two identifiers. I discussed the limitations, risks, security and privacy concerns of performing psychotherapy and the availability of in person appointments. I also discussed with the patient that there may be a patient responsible charge related to this service. The patient expressed understanding and agreed to proceed. I discussed the treatment planning with the patient. The patient was provided an opportunity to ask questions and all were answered. The patient agreed with the plan and demonstrated an understanding of the instructions. The patient was advised to call  our office if  symptoms worsen or feel they are in a crisis state and need immediate contact.   Therapist Location: office Patient Location: home   Reported Symptoms: palliative care patient and been in bed more today, depressed, dealing with physical pain and "mental pain"  Mental Status Exam:  Appearance:   Casual     Behavior:  Appropriate, Sharing, and Motivated  Motor:  In bed a lot of the time due to her physical condition  Speech/Language:   Clear and Coherent  Affect:  Depressed  Mood:  depressed  Thought process:  goal directed  Thought content:    WNL  Sensory/Perceptual disturbances:    WNL  Orientation:  oriented to person, place, time/date, situation, day of week, month of year, year, and stated date of March 25, 2023  Attention:  Good  Concentration:  Good  Memory:  Some short term forgetting at times and Dr is aware  Fund of knowledge:   Good and Fair  Insight:    Good  Judgment:   Good  Impulse  Control:  Good   Risk Assessment: Danger to Self:  No Self-injurious Behavior: No Danger to Others: No Duty to Warn:no Physical Aggression / Violence:No  Access to Firearms a concern: No  Gang Involvement:No   Subjective:   Patient today involved in video telehealth session working on her anxiety, depression, and family relationship issues.  Worries about her cancer, explaining what all this means for her.  Responds well to support and is able to ask in order to get her needs met. Feeling more discouraged and can't participate in some family activities. Feeling alone and sometimes "like I'm already gone, left behind." Tearfulness increased as patient talked more thoroughly through these feelings and the emotional pain that goes along with them. Shared some other thought/feelings that she has not shared with husband, talked through these more today, and stated she may share with husband also as it may help him understand her better and where her thoughts are at times. Did well venting today in session her worries/concerns re: her health status and her family especially her 6 yr old son. Stated she appreciated the session today as she really needed to talk through the current thoughts/some regrets that she processed today, along with her anxious thoughts about her cancer. Does continues to receive support from other friends and also palliative care. Also encouraged her openness in talking through her anxiety, fears, frustrations and sadness during our sessions and also with some of her family/personal friends who she feels comfortable with.  Interventions: Cognitive Behavioral Therapy and Ego-Supportive  Long-term  goal:  Patient will confront her anxiety, fears, anger reported re: her illness and use time in therapy and beyond to work on this, while receiving feedback and support from therapist. Help her determine what she feels is realistic for her considering her health challenges and difficult  prognosis.  Short-term goal: Participate in therapy and initiate communication of needs and desires at this point in her struggle physically.  Strategies: Share and talk through anxiety, anger, and depression that are experienced and work to find and use helpful coping tools to better manage those feelings.   Diagnosis:   ICD-10-CM   1. Major depressive disorder, recurrent episode, moderate (HCC)  F33.1      Plan:   Patient today motivated and working in session on her anxiety, depression, and family relationship issues especially with her son/stepdaughter/and other family members and friends where she does not always feel understood by them.  Was very open today and very appreciative of her appointment.  To continue working with her goal-directed behaviors and having the supports she needs along with being able to talk through the issues that are helpful to her at this point in her life.  Goal review and progress/challenges noted with patient.  Next appointment within 2 to 3 weeks.   Mathis Fare, LCSW

## 2023-03-25 NOTE — Progress Notes (Deleted)
      [facility] Counselor/Therapist Progress Note  Patient ID: [name], MRN: [id number],    Date: 03/25/2023  Time Spent: ***   Treatment Type: {CHL AMB THERAPY [phone]}  Reported Symptoms: ***  Mental Status Exam:  Appearance:   {PSY:22683}     Behavior:  {PSY:21022743}  Motor:  {PSY:22302}  Speech/Language:   {PSY:22685}  Affect:  {PSY:22687}  Mood:  {PSY:31886}  Thought process:  {PSY:31888}  Thought content:    {PSY:815-560-2913}  Sensory/Perceptual disturbances:    {PSY:803-116-0209}  Orientation:  {PSY:30297}  Attention:  {PSY:22877}  Concentration:  {PSY:9473503365}  Memory:  {PSY:551-444-0294}  Fund of knowledge:   {PSY:9473503365}  Insight:    {PSY:9473503365}  Judgment:   {PSY:9473503365}  Impulse Control:  {PSY:9473503365}   Risk Assessment: Danger to Self:  {PSY:22692} Self-injurious Behavior: {PSY:22692} Danger to Others: {PSY:22692} Duty to Warn:{PSY:311194} Physical Aggression / Violence:{PSY:21197} Access to Firearms a concern: {PSY:21197} Gang Involvement:{PSY:21197}  Subjective: ***   Interventions: {PSY:8126844612}  Diagnosis:No diagnosis found.  Plan: ***  Mathis Fare, LCSW

## 2023-03-26 ENCOUNTER — Other Ambulatory Visit: Payer: Self-pay | Admitting: Radiology

## 2023-03-26 ENCOUNTER — Ambulatory Visit (HOSPITAL_COMMUNITY)
Admission: RE | Admit: 2023-03-26 | Discharge: 2023-03-26 | Disposition: A | Discharge status: Home / Self Care | Payer: BC Managed Care – PPO | Source: Ambulatory Visit | Attending: Endocrinology | Admitting: Endocrinology

## 2023-03-26 DIAGNOSIS — J9610 Chronic respiratory failure, unspecified whether with hypoxia or hypercapnia: Secondary | ICD-10-CM | POA: Diagnosis not present

## 2023-03-26 DIAGNOSIS — E2740 Unspecified adrenocortical insufficiency: Secondary | ICD-10-CM | POA: Diagnosis not present

## 2023-03-26 DIAGNOSIS — Z85850 Personal history of malignant neoplasm of thyroid: Secondary | ICD-10-CM | POA: Insufficient documentation

## 2023-03-26 DIAGNOSIS — I878 Other specified disorders of veins: Secondary | ICD-10-CM

## 2023-03-26 DIAGNOSIS — Z853 Personal history of malignant neoplasm of breast: Secondary | ICD-10-CM | POA: Diagnosis not present

## 2023-03-26 DIAGNOSIS — I509 Heart failure, unspecified: Secondary | ICD-10-CM | POA: Diagnosis not present

## 2023-03-26 DIAGNOSIS — Z8541 Personal history of malignant neoplasm of cervix uteri: Secondary | ICD-10-CM | POA: Diagnosis not present

## 2023-03-26 DIAGNOSIS — Z452 Encounter for adjustment and management of vascular access device: Secondary | ICD-10-CM | POA: Diagnosis not present

## 2023-03-26 DIAGNOSIS — Z66 Do not resuscitate: Secondary | ICD-10-CM | POA: Diagnosis not present

## 2023-03-26 DIAGNOSIS — T8249XA Other complication of vascular dialysis catheter, initial encounter: Secondary | ICD-10-CM | POA: Diagnosis not present

## 2023-03-26 HISTORY — PX: IR FLUORO GUIDE CV LINE RIGHT: IMG2283

## 2023-03-26 MED ORDER — DIPHENHYDRAMINE HCL 50 MG PO CAPS
50.0000 mg | ORAL_CAPSULE | Freq: Once | ORAL | Status: AC | PRN
  Administered 2023-03-26: 50 mg via ORAL

## 2023-03-26 MED ORDER — DIAZEPAM 5 MG PO TABS
ORAL_TABLET | ORAL | Status: AC
  Filled 2023-03-26: qty 1

## 2023-03-26 MED ORDER — DIAZEPAM 5 MG PO TABS
5.0000 mg | ORAL_TABLET | Freq: Once | ORAL | Status: AC | PRN
  Administered 2023-03-26: 5 mg via ORAL

## 2023-03-26 MED ORDER — CHLORHEXIDINE GLUCONATE 4 % EX SOLN
CUTANEOUS | Status: AC
  Filled 2023-03-26: qty 15

## 2023-03-26 MED ORDER — LIDOCAINE HCL 1 % IJ SOLN
INTRAMUSCULAR | Status: AC
  Filled 2023-03-26: qty 20

## 2023-03-26 MED ORDER — DIPHENHYDRAMINE HCL 25 MG PO CAPS
ORAL_CAPSULE | ORAL | Status: AC
  Filled 2023-03-26: qty 2

## 2023-03-26 MED ORDER — HEPARIN SOD (PORK) LOCK FLUSH 100 UNIT/ML IV SOLN
500.0000 [IU] | Freq: Once | INTRAVENOUS | Status: AC
  Administered 2023-03-26: 500 [IU] via INTRAVENOUS

## 2023-03-26 MED ORDER — HEPARIN SOD (PORK) LOCK FLUSH 100 UNIT/ML IV SOLN
INTRAVENOUS | Status: AC
  Filled 2023-03-26: qty 5

## 2023-03-26 MED ORDER — LIDOCAINE-EPINEPHRINE 1 %-1:100000 IJ SOLN
20.0000 mL | Freq: Once | INTRAMUSCULAR | Status: AC
  Administered 2023-03-26: 10 mL via INTRADERMAL

## 2023-03-26 MED ORDER — LIDOCAINE-EPINEPHRINE 1 %-1:100000 IJ SOLN
INTRAMUSCULAR | Status: AC
  Filled 2023-03-26: qty 1

## 2023-03-26 NOTE — Procedures (Signed)
Vascular and Interventional Radiology Procedure Note  Patient: [name] DOB: [birth year] Medical Record Number: [id number] Note Date/Time: 03/26/23 10:32 AM   Performing Physician: Roanna Banning, MD Assistant(s): None  Diagnosis: Poor IV access. Hx Addison Dx  Procedure: TUNNELED CENTRAL VENOUS CATHETER EXCHANGE  Anesthesia: Local Anesthetic Complications: None Estimated Blood Loss:  0 mL Specimens:  None  Findings:  Successful exchange of right-sided, 19 cm (tip-to-cuff), tunneled CVC with the tip of the catheter in the proximal right atrium.  Plan: Catheter ready for use.  See detailed procedure note with images in PACS. The patient tolerated the procedure well without incident or complication and was returned to Recovery in stable condition.    Roanna Banning, MD [facility] Rad[facility]   Pager. [phone] Clinic. [phone]

## 2023-03-26 NOTE — H&P (Signed)
Chief Complaint: Patient was seen in consultation today for tunneled central catheter exchange/replaceet at the request of Balan,Bindubal  Referring Physician(s): Balan,Bindubal  Supervising Physician: Roanna Banning  Patient Status: [facility] - Out-pt  History of Present Illness: [name] is a 52 y.o. female   DNR Code status per pt Complicated Hx Chronic resp failure; eosinophilic asthma; CHF; adrenal insufficiency; Hxz Hodkin's Lymphoma Breast Cancer; Cervical Ca; Thyroid Ca; chronic N/V Recent hospitalization early June for sepsis/PNA  Has existing tunn central catheter placed 10/2022 in IR Exchanged 12/2022 in IR Last use 6 days ago Slow flow and cannot aspirate Uses for IV access; antibiotics; blood draws; fluids HHRN 2 days/week  Scheduled today for tunneled central catheter exchange/replacement  Past Medical History:  Diagnosis Date   Addison's disease (HCC)    Adrenal insufficiency (HCC)    Anemia    Anxiety    Aortic stenosis    Aortic stenosis    Appendicitis 12/19/09   Appendicitis    Breast cancer (HCC)    STATUS POST BILATERAL MASTECTOMY. STATUS POST RECONSTRUCTION. SHE HAD SILICONE BREAST IMPLANTS AND THE LEFT IMPLANT IS LEAKING SLIGHTLY   Cellulitis of right middle finger 11/07/2018   Cervical cancer (HCC) 12/23/2018   Chest pain    CHF with right heart failure (HCC) 04/17/2017   Chronic respiratory failure with hypoxia (HCC) 12/23/2018   Cough variant asthma 04/13/2019   Depression    GERD (gastroesophageal reflux disease)    takes Dexilant and carafate and gi coctail    Headache    migraines on a daily and monthly regimen    Heart murmur    History of kidney stones    Hodgkin lymphoma (HCC)    STATUS POST MANTLE RADIATION   Hodgkin's lymphoma (HCC)    1987   Hypertension    Hypoxia    Necrotizing fasciitis (HCC) 12/23/2018   Non-ischemic cardiomyopathy (HCC)    Osteoporosis    Palpitations    Pituitary adenoma (HCC) 12/23/2018   Pneumonia     PONV (postoperative nausea and vomiting)    Pre-diabetes    per pt; no meds   Pulmonary hypertension (HCC) 12/23/2018   Raynaud phenomenon    Right heart failure (HCC) 04/17/2017   Seizures (HCC)    last febrile seizure was approx 3 weeks ago per report on 12/01/2020   Sleep apnea    upcoming sleep study per pt    Supplemental oxygen dependent    3 liters   SVT (supraventricular tachycardia)    Tachycardia    Thyroid cancer (HCC)    STATUS POST SURGICAL REMOVAL-CURRENT ON THYROID REPLACEMENT    Past Surgical History:  Procedure Laterality Date   ABDOMINAL HYSTERECTOMY     AMPUTATION Left 01/30/2019   Procedure: Left Index finger amputation with flap reconstruction and repair reconstruction;  Surgeon: Dominica Severin, MD;  Location: [facility] OR;  Service: Orthopedics;  Laterality: Left;   APPENDECTOMY     breast implants and removal      breast implants but leaking      CARDIAC CATHETERIZATION  05/18/09   NORMAL CATH   COLONOSCOPY     hx of chemotherapy      hx of radiation therapy      I & D EXTREMITY Left 12/23/2018   Procedure: IRRIGATION AND DEBRIDEMENT HAND / INDEX FINGER;  Surgeon: Dominica Severin, MD;  Location: [facility] OR;  Service: Orthopedics;  Laterality: Left;   IR CV LINE INJECTION  03/22/2022   IR FLUORO GUIDE CV  LINE RIGHT  10/09/2022   IR FLUORO GUIDE CV LINE RIGHT  01/18/2023   IR IMAGING GUIDED PORT INSERTION  05/06/2020   IR IMAGING GUIDED PORT INSERTION  12/04/2021   IR REMOVAL TUN ACCESS W/ PORT W/O FL MOD SED  04/27/2020   IR REMOVAL TUN ACCESS W/ PORT W/O FL MOD SED  12/04/2021   IR REMOVAL TUN ACCESS W/ PORT W/O FL MOD SED  03/30/2022   IR US GUIDE VASC ACCESS RIGHT  10/09/2022   KIDNEY STONE SURGERY     LUMBAR PUNCTURE W/ INTRATHECAL CHEMOTHERAPY     MASTECTOMY     PITUITARY SURGERY     RIGHT/LEFT HEART CATH AND CORONARY ANGIOGRAPHY N/A 04/02/2018   Procedure: RIGHT/LEFT HEART CATH AND CORONARY ANGIOGRAPHY;  Surgeon: Kathleene Hazel, MD;  Location: [facility] INVASIVE CV LAB;   Service: Cardiovascular;  Laterality: N/A;   RIGHT/LEFT HEART CATH AND CORONARY ANGIOGRAPHY N/A 08/31/2022   Procedure: RIGHT/LEFT HEART CATH AND CORONARY ANGIOGRAPHY;  Surgeon: Dolores Patty, MD;  Location: [facility] INVASIVE CV LAB;  Service: Cardiovascular;  Laterality: N/A;   TOOTH EXTRACTION N/A 12/05/2020   Procedure: DENTAL RESTORATION/EXTRACTIONS;  Surgeon: Lovena Neighbours, MD;  Location: [facility] ORS;  Service: Oral Surgery;  Laterality: N/A;   TOTAL THYROIDECTOMY     VIDEO BRONCHOSCOPY Bilateral 11/14/2018   Procedure: VIDEO BRONCHOSCOPY WITHOUT FLUORO;  Surgeon: Luciano Cutter, MD;  Location: [facility] ENDOSCOPY;  Service: Cardiopulmonary;  Laterality: Bilateral;   VIDEO BRONCHOSCOPY WITH ENDOBRONCHIAL ULTRASOUND N/A 11/19/2018   Procedure: VIDEO BRONCHOSCOPY WITH ENDOBRONCHIAL ULTRASOUND;  Surgeon: Luciano Cutter, MD;  Location: [facility] OR;  Service: Thoracic;  Laterality: N/A;    Allergies: Ferrous bisglycinate chelate [iron], Mushroom extract complex, Na ferric gluc cplx in sucrose, Cymbalta [duloxetine hcl], Hydromorphone, Ondansetron hcl, Promethazine, Succinylcholine, Buprenorphine hcl, Compazine, Duloxetine, Metoclopramide, Morphine and codeine, Ondansetron, Promethazine hcl, and Tegaderm ag mesh [silver]  Medications: Prior to Admission medications   Medication Sig Start Date End Date Taking? Authorizing Provider  albuterol (PROVENTIL) (2.5 MG/3ML) 0.083% nebulizer solution Take 3 mLs (2.5 mg total) by nebulization every 6 (six) hours as needed for wheezing or shortness of breath. 07/09/22 03/09/23  Pokhrel, Rebekah Chesterfield, MD  aspirin EC 81 MG tablet Take 1 tablet (81 mg total) by mouth daily. Swallow whole. Patient taking differently: Take 81 mg by mouth at bedtime. Chewable 07/16/22   Nahser, Deloris Ping, MD  Bismuth Tribromoph-Petrolatum (XEROFORM OCCLUSIVE GAUZE STRIP) PADS Apply 1 each topically as directed. 09/17/22   Rolly Salter, MD  bumetanide (BUMEX) 1 MG tablet TAKE 1 TABLET BY MOUTH  TWICE A DAY Patient taking differently: Take 1 mg by mouth 2 (two) times daily. 11/20/22   Nahser, Deloris Ping, MD  buPROPion (WELLBUTRIN XL) 150 MG 24 hr tablet TAKE 1 TABLET BY MOUTH EVERY DAY 02/07/23   Mozingo, Thereasa Solo, NP  clonazePAM (KLONOPIN) 1 MG tablet Take 1 tablet (1 mg total) by mouth 2 (two) times daily. Patient taking differently: Take 1 mg by mouth in the morning, at noon, and at bedtime. 02/15/23   Mozingo, Thereasa Solo, NP  dexlansoprazole (DEXILANT) 60 MG capsule Take 60 mg by mouth daily.    [provider]  Diclofenac Potassium,Migraine, (CAMBIA) 50 MG PACK Take 50 mg by mouth daily as needed for migraine. 05/16/21   [provider]  diphenhydrAMINE (BENADRYL) 12.5 MG/5ML elixir Take 10 mLs (25 mg total) by mouth every 6 (six) hours as needed (nausea). 09/17/22   Rolly Salter, MD  dronabinol [facility])  2.5 MG capsule TAKE 1 CAPSULE BY MOUTH TWICE A DAY AS NEEDED NAUSEA Patient taking differently: Take 2.5 mg by mouth at bedtime. 09/10/22   Serena Croissant, MD  EMGALITY 120 MG/ML SOSY Inject 120 mg into the skin every 28 (twenty-eight) days.    [provider]  EPINEPHRINE 0.3 mg/0.3 mL IJ SOAJ injection INJECT 0.3 MG INTO THE MUSCLE AS NEEDED FOR ANAPHYLAXIS. 01/21/23   Luciano Cutter, MD  escitalopram (LEXAPRO) 20 MG tablet TAKE 1 TABLET BY MOUTH EVERY DAY IN THE EVENING Patient taking differently: Take 20 mg by mouth every evening. 03/08/23   Mozingo, Thereasa Solo, NP  FLORASTOR 250 MG capsule Take 250 mg by mouth 2 (two) times daily.    [provider]  fluticasone-salmeterol (ADVAIR HFA) 230-21 MCG/ACT inhaler INHALE 2 PUFFS INTO THE LUNGS TWICE A DAY Patient taking differently: Inhale 2 puffs into the lungs 2 (two) times daily. 08/15/22   Luciano Cutter, MD  guaiFENesin (ROBITUSSIN) 100 MG/5ML liquid Take 15 mLs by mouth every 6 (six) hours as needed for cough or to loosen phlegm. 07/09/22   Pokhrel, Rebekah Chesterfield, MD  Heparin Na, Pork,  Lock Flsh PF (BD HEPARIN POSIFLUSH) 100 UNIT/ML SOLN USE 5 MLS IN PORT A CATH ONCE DAILY AFTER MEDICATION ADMINISTRATION AS A HEPLOCK AS DIRECTED. 03/05/23     hydrocortisone (CORTEF) 10 MG tablet Take 1-2 tablets (10-20 mg total) by mouth See admin instructions. Take 20 mg in the am and 10mg  in the evening. Take after completing prednisone 09/29/22   Rolly Salter, MD  HYDROmorphone (DILAUDID) 4 MG tablet Take 1 tablet (4 mg total) by mouth every 4 (four) hours as needed. Patient taking differently: Take 4 mg by mouth every 4 (four) hours as needed for moderate pain. 12/28/22     hydrOXYzine (ATARAX) 25 MG tablet Take 1 tablet (25 mg total) by mouth every 6 (six) hours as needed for anxiety. 01/24/23   Azucena Fallen, MD  lamoTRIgine (LAMICTAL) 200 MG tablet TAKE 1 TABLET BY MOUTH EVERYDAY AT BEDTIME Patient taking differently: Take 200 mg by mouth at bedtime. 12/24/22   Mozingo, Thereasa Solo, NP  lamoTRIgine (LAMICTAL) 25 MG tablet Take 1 tablet (25 mg total) by mouth daily. 03/04/23   Mozingo, Thereasa Solo, NP  Lancets [facility] DELICA PLUS [place]) MISC 3 (three) times daily. for testing 06/12/21   [provider]  LORazepam (ATIVAN) 0.5 MG tablet Take 1 tablet (0.5 mg total) by mouth 3 (three) times daily as needed for anxiety. 02/11/23   Mozingo, Thereasa Solo, NP  menthol-cetylpyridinium (CEPACOL) 3 MG lozenge Take 1 lozenge (3 mg total) by mouth as needed for sore throat. 09/17/22   Rolly Salter, MD  nitroGLYCERIN (NITROSTAT) 0.4 MG SL tablet Dissolve 1 tablet under the tongue every 5 minutes as needed for chest pain. Max of 3 doses, then 911. 08/20/22   Nahser, Deloris Ping, MD  [facility] VERIO test strip 3 (three) times daily. for testing 06/12/21   [provider]  OXYGEN Inhale 4-5 L/min into the lungs continuous.    [provider]  PROAIR HFA 108 [phone] Base) MCG/ACT inhaler Inhale 2 puffs into the lungs every 4 (four) hours as needed for wheezing or  shortness of breath.    [provider]  QVAR REDIHALER 80 MCG/ACT inhaler INHALE 1 PUFF INTO THE LUNGS TWICE A DAY Patient taking differently: Inhale 1 puff into the lungs 2 (two) times daily. 01/21/23   Luciano Cutter, MD  Rimegepant Sulfate (NURTEC) 75 MG TBDP Take 75 mg by mouth daily as needed (Migraine).     [provider]  rosuvastatin (CRESTOR) 10 MG tablet Take 10 mg by mouth daily. Mid morning 02/28/18   [provider]  sacubitril-valsartan (ENTRESTO) 24-26 MG Take 1 tablet by mouth 2 (two) times daily. 09/22/22   Rolly Salter, MD  silver sulfADIAZINE (SILVADENE) 1 % cream Apply to affected area daily Patient taking differently: Apply 1 Application topically daily as needed. 09/17/22 09/17/23  Rolly Salter, MD  Sodium Chloride Flush (NORMAL SALINE FLUSH) 0.9 % SOLN Use as directed for central venous catheter maintenance 03/05/23     Sodium Chloride Flush (NORMAL SALINE FLUSH) 0.9 % SOLN Use as directed for central venous catheter maintenance 01/31/23     sucralfate (CARAFATE) 1 GM/10ML suspension Take 1 g by mouth daily as needed (as directed for ulcers).    [provider]  SYNTHROID 100 MCG tablet Take 1 tablet (100 mcg total) by mouth every morning. Patient taking differently: Take 100 mcg by mouth every morning. * Must be name brand* 10/19/21   Serena Croissant, MD  Tiotropium Bromide Monohydrate (SPIRIVA RESPIMAT) 1.25 MCG/ACT AERS Inhale 2 puffs into the lungs daily. 08/15/22   Luciano Cutter, MD  topiramate (TOPAMAX) 50 MG tablet Take 150 mg by mouth at bedtime.  12/25/19   [provider]  zolpidem (AMBIEN CR) 12.5 MG CR tablet Take 12.5 mg by mouth at bedtime. 03/05/22   [provider]     Family History  Family history unknown: Yes    Social History   Socioeconomic History   Marital status: Married    Spouse name: Not on file   Number of children: Not on file   Years of education: Not on file   Highest education  level: Not on file  Occupational History   Not on file  Tobacco Use   Smoking status: Never   Smokeless tobacco: Never  Vaping Use   Vaping Use: Never used  Substance and Sexual Activity   Alcohol use: Not Currently    Comment: social    Drug use: No   Sexual activity: Not Currently    Birth control/protection: Surgical  Other Topics Concern   Not on file  Social History Narrative   Not on file   Social Determinants of Health   Financial Resource Strain: Not on file  Food Insecurity: No Food Insecurity (03/09/2023)   Hunger Vital Sign    Worried About Running Out of Food in the Last Year: Never true    Ran Out of Food in the Last Year: Never true  Transportation Needs: No Transportation Needs (03/09/2023)   PRAPARE - Administrator, Civil Service (Medical): No    Lack of Transportation (Non-Medical): No  Physical Activity: Not on file  Stress: Not on file  Social Connections: Not on file    Review of Systems: A 12 point ROS discussed and pertinent positives are indicated in the HPI above.  All other systems are negative.  Vital Signs: LMP  (LMP Unknown)     Physical Exam Vitals reviewed.  Skin:    General: Skin is warm.  Neurological:     Mental Status: She is alert and oriented to person, place, and time.  Psychiatric:        Behavior: Behavior normal.     Imaging: DG Chest 2 View  Result Date: 03/09/2023 CLINICAL DATA:  Sepsis EXAM: CHEST - 2  VIEW COMPARISON:  X-ray 01/22/2023.  CT scan 01/23/2023 FINDINGS: Stable right IJ line with tip overlying the upper right atrium near the SVC margin. Bilateral axillary surgical clips. Underinflation. No pneumothorax or effusion. Developing ill-defined parenchymal interstitial opacities along the lung bases, left-greater-than-right. No pneumothorax or effusion. Stable cardiopericardial silhouette with a calcified aorta. IMPRESSION: Developing opacities along the lung bases, left-greater-than-right. Possible  infiltrate. Recommend follow-up. Electronically Signed   By: Karen Kays M.D.   On: 03/09/2023 16:34    Labs:  CBC: Recent Labs    03/09/23 1542 03/10/23 0522 03/11/23 0445 03/12/23 0500  WBC 20.1* 15.6* 12.9* 11.9*  HGB 9.0* 8.4* 8.2* 7.9*  HCT 29.5* 27.8* 27.8* 26.8*  PLT 380 356 366 332    COAGS: Recent Labs    04/06/22 0546 01/22/23 1823 01/23/23 0542 03/09/23 1542  INR 1.2 1.0 1.1 1.1  APTT  --   --   --  43*    BMP: Recent Labs    01/23/23 0542 03/09/23 1542 03/10/23 0522 03/11/23 0445  NA 137 135 139 139  K 3.2* 3.2* 3.3* 4.2  CL 104 99 106 108  CO2 23 24 24 24   GLUCOSE 116* 110* 148* 83  BUN 20 31* 27* 19  CALCIUM 8.2* 8.9 8.6* 8.8*  CREATININE 1.28* 1.26* 1.11* 1.01*  GFRNONAA 51* 52* >60 >60    LIVER FUNCTION TESTS: Recent Labs    07/01/22 1336 09/13/22 0427 01/22/23 1823 03/09/23 1542  BILITOT 0.7 0.5 0.6 0.4  AST 32 25 27 22   ALT 29 25 46* 36  ALKPHOS 70 64 93 55  PROT 7.6 7.6 8.1 7.3  ALBUMIN 4.0 4.3 3.5 3.6    TUMOR MARKERS: No results for input(s): "AFPTM", "CEA", "CA199", "CHROMGRNA" in the last 8760 hours.  Assessment and Plan:  Scheduled for tunneled central catheter exchange/replacement Pt is agreeable to proceed Consent signed and in chart  Thank you for this interesting consult.  I greatly enjoyed meeting [name] and look forward to participating in their care.  A copy of this report was sent to the requesting provider on this date.  Electronically Signed: Robet Leu, PA-C 03/26/2023, 9:15 AM   I spent a total of    15 Minutes in face to face in clinical consultation, greater than 50% of which was counseling/coordinating care for tunneled central catheter exchange

## 2023-03-27 ENCOUNTER — Telehealth: Payer: Self-pay | Admitting: Student

## 2023-03-27 ENCOUNTER — Other Ambulatory Visit: Payer: Self-pay | Admitting: Student

## 2023-03-27 NOTE — Telephone Encounter (Signed)
Patient called with concern of pain following tunneled central catheter placement/exchange on 03/26/23. The patient states she is having 7/10 pain at her site and that she wants to have the catheter removed. Patient was informed that to do so, there would need to be an order placed for tunneled central catheter removal from the patient's physician that had requested the placement. Dr Willeen Cass office was contacted regarding this issue, and Dr Willeen Cass RN stated that Dr Talmage Nap would review the patient's case and reported symptoms. Patient was called back with this info and informed to present to ED for evaluation if she develops fever, chills, discharge from catheter site, chest pain, difficulty breathing, or altered mental status. Patient voiced her understanding and willingness to comply.  Kennieth Francois, PA-C 03/27/2023

## 2023-03-28 DIAGNOSIS — J45909 Unspecified asthma, uncomplicated: Secondary | ICD-10-CM | POA: Diagnosis not present

## 2023-03-28 DIAGNOSIS — I5030 Unspecified diastolic (congestive) heart failure: Secondary | ICD-10-CM | POA: Diagnosis not present

## 2023-03-28 DIAGNOSIS — E785 Hyperlipidemia, unspecified: Secondary | ICD-10-CM | POA: Diagnosis not present

## 2023-03-28 DIAGNOSIS — R1310 Dysphagia, unspecified: Secondary | ICD-10-CM | POA: Diagnosis not present

## 2023-03-28 DIAGNOSIS — K219 Gastro-esophageal reflux disease without esophagitis: Secondary | ICD-10-CM | POA: Diagnosis not present

## 2023-03-28 DIAGNOSIS — E271 Primary adrenocortical insufficiency: Secondary | ICD-10-CM | POA: Diagnosis not present

## 2023-03-28 DIAGNOSIS — E890 Postprocedural hypothyroidism: Secondary | ICD-10-CM | POA: Diagnosis not present

## 2023-03-28 DIAGNOSIS — J9611 Chronic respiratory failure with hypoxia: Secondary | ICD-10-CM | POA: Diagnosis not present

## 2023-03-28 DIAGNOSIS — F32A Depression, unspecified: Secondary | ICD-10-CM | POA: Diagnosis not present

## 2023-03-28 DIAGNOSIS — C73 Malignant neoplasm of thyroid gland: Secondary | ICD-10-CM | POA: Diagnosis not present

## 2023-03-28 DIAGNOSIS — N1831 Chronic kidney disease, stage 3a: Secondary | ICD-10-CM | POA: Diagnosis not present

## 2023-03-28 DIAGNOSIS — R634 Abnormal weight loss: Secondary | ICD-10-CM | POA: Diagnosis not present

## 2023-03-28 DIAGNOSIS — K2210 Ulcer of esophagus without bleeding: Secondary | ICD-10-CM | POA: Diagnosis not present

## 2023-03-28 DIAGNOSIS — J189 Pneumonia, unspecified organism: Secondary | ICD-10-CM | POA: Diagnosis not present

## 2023-03-29 ENCOUNTER — Encounter: Payer: Self-pay | Admitting: Hematology and Oncology

## 2023-03-29 ENCOUNTER — Other Ambulatory Visit (HOSPITAL_COMMUNITY): Payer: Self-pay

## 2023-03-29 DIAGNOSIS — R634 Abnormal weight loss: Secondary | ICD-10-CM | POA: Diagnosis not present

## 2023-03-29 DIAGNOSIS — J9611 Chronic respiratory failure with hypoxia: Secondary | ICD-10-CM | POA: Diagnosis not present

## 2023-03-29 DIAGNOSIS — I5030 Unspecified diastolic (congestive) heart failure: Secondary | ICD-10-CM | POA: Diagnosis not present

## 2023-03-29 DIAGNOSIS — E890 Postprocedural hypothyroidism: Secondary | ICD-10-CM | POA: Diagnosis not present

## 2023-03-29 DIAGNOSIS — K2210 Ulcer of esophagus without bleeding: Secondary | ICD-10-CM | POA: Diagnosis not present

## 2023-03-29 DIAGNOSIS — C73 Malignant neoplasm of thyroid gland: Secondary | ICD-10-CM | POA: Diagnosis not present

## 2023-03-29 DIAGNOSIS — E785 Hyperlipidemia, unspecified: Secondary | ICD-10-CM | POA: Diagnosis not present

## 2023-03-29 DIAGNOSIS — R1310 Dysphagia, unspecified: Secondary | ICD-10-CM | POA: Diagnosis not present

## 2023-03-29 DIAGNOSIS — J45909 Unspecified asthma, uncomplicated: Secondary | ICD-10-CM | POA: Diagnosis not present

## 2023-03-29 DIAGNOSIS — R911 Solitary pulmonary nodule: Secondary | ICD-10-CM | POA: Diagnosis not present

## 2023-03-29 DIAGNOSIS — N1831 Chronic kidney disease, stage 3a: Secondary | ICD-10-CM | POA: Diagnosis not present

## 2023-03-29 DIAGNOSIS — E271 Primary adrenocortical insufficiency: Secondary | ICD-10-CM | POA: Diagnosis not present

## 2023-03-29 DIAGNOSIS — K219 Gastro-esophageal reflux disease without esophagitis: Secondary | ICD-10-CM | POA: Diagnosis not present

## 2023-03-29 DIAGNOSIS — J189 Pneumonia, unspecified organism: Secondary | ICD-10-CM | POA: Diagnosis not present

## 2023-03-29 DIAGNOSIS — F32A Depression, unspecified: Secondary | ICD-10-CM | POA: Diagnosis not present

## 2023-03-30 ENCOUNTER — Emergency Department (HOSPITAL_BASED_OUTPATIENT_CLINIC_OR_DEPARTMENT_OTHER): Payer: BC Managed Care – PPO

## 2023-03-30 ENCOUNTER — Emergency Department (HOSPITAL_BASED_OUTPATIENT_CLINIC_OR_DEPARTMENT_OTHER)
Admission: EM | Admit: 2023-03-30 | Discharge: 2023-03-30 | Disposition: A | Discharge status: Home / Self Care | Payer: BC Managed Care – PPO | Attending: Emergency Medicine | Admitting: Emergency Medicine

## 2023-03-30 ENCOUNTER — Encounter (HOSPITAL_BASED_OUTPATIENT_CLINIC_OR_DEPARTMENT_OTHER): Payer: Self-pay | Admitting: Emergency Medicine

## 2023-03-30 ENCOUNTER — Telehealth (HOSPITAL_BASED_OUTPATIENT_CLINIC_OR_DEPARTMENT_OTHER): Payer: Self-pay | Admitting: Emergency Medicine

## 2023-03-30 ENCOUNTER — Other Ambulatory Visit: Payer: Self-pay

## 2023-03-30 DIAGNOSIS — Z853 Personal history of malignant neoplasm of breast: Secondary | ICD-10-CM | POA: Insufficient documentation

## 2023-03-30 DIAGNOSIS — R0989 Other specified symptoms and signs involving the circulatory and respiratory systems: Secondary | ICD-10-CM | POA: Diagnosis not present

## 2023-03-30 DIAGNOSIS — Z8541 Personal history of malignant neoplasm of cervix uteri: Secondary | ICD-10-CM | POA: Insufficient documentation

## 2023-03-30 DIAGNOSIS — I509 Heart failure, unspecified: Secondary | ICD-10-CM | POA: Diagnosis not present

## 2023-03-30 DIAGNOSIS — C73 Malignant neoplasm of thyroid gland: Secondary | ICD-10-CM | POA: Diagnosis not present

## 2023-03-30 DIAGNOSIS — R634 Abnormal weight loss: Secondary | ICD-10-CM | POA: Diagnosis not present

## 2023-03-30 DIAGNOSIS — R1310 Dysphagia, unspecified: Secondary | ICD-10-CM | POA: Diagnosis not present

## 2023-03-30 DIAGNOSIS — R918 Other nonspecific abnormal finding of lung field: Secondary | ICD-10-CM | POA: Diagnosis not present

## 2023-03-30 DIAGNOSIS — K2210 Ulcer of esophagus without bleeding: Secondary | ICD-10-CM | POA: Diagnosis not present

## 2023-03-30 DIAGNOSIS — F32A Depression, unspecified: Secondary | ICD-10-CM | POA: Diagnosis not present

## 2023-03-30 DIAGNOSIS — E271 Primary adrenocortical insufficiency: Secondary | ICD-10-CM | POA: Diagnosis not present

## 2023-03-30 DIAGNOSIS — J45909 Unspecified asthma, uncomplicated: Secondary | ICD-10-CM | POA: Diagnosis not present

## 2023-03-30 DIAGNOSIS — Z7982 Long term (current) use of aspirin: Secondary | ICD-10-CM | POA: Diagnosis not present

## 2023-03-30 DIAGNOSIS — Z85850 Personal history of malignant neoplasm of thyroid: Secondary | ICD-10-CM | POA: Diagnosis not present

## 2023-03-30 DIAGNOSIS — E785 Hyperlipidemia, unspecified: Secondary | ICD-10-CM | POA: Diagnosis not present

## 2023-03-30 DIAGNOSIS — J189 Pneumonia, unspecified organism: Secondary | ICD-10-CM | POA: Diagnosis not present

## 2023-03-30 DIAGNOSIS — E890 Postprocedural hypothyroidism: Secondary | ICD-10-CM | POA: Diagnosis not present

## 2023-03-30 DIAGNOSIS — K219 Gastro-esophageal reflux disease without esophagitis: Secondary | ICD-10-CM | POA: Diagnosis not present

## 2023-03-30 DIAGNOSIS — I5030 Unspecified diastolic (congestive) heart failure: Secondary | ICD-10-CM | POA: Diagnosis not present

## 2023-03-30 DIAGNOSIS — N1831 Chronic kidney disease, stage 3a: Secondary | ICD-10-CM | POA: Diagnosis not present

## 2023-03-30 DIAGNOSIS — J9611 Chronic respiratory failure with hypoxia: Secondary | ICD-10-CM | POA: Diagnosis not present

## 2023-03-30 DIAGNOSIS — Z452 Encounter for adjustment and management of vascular access device: Secondary | ICD-10-CM | POA: Diagnosis not present

## 2023-03-30 DIAGNOSIS — Z8571 Personal history of Hodgkin lymphoma: Secondary | ICD-10-CM | POA: Diagnosis not present

## 2023-03-30 LAB — GROUP A STREP BY PCR: Group A Strep by PCR: NOT DETECTED

## 2023-03-30 MED ORDER — OXYCODONE HCL 5 MG/5ML PO SOLN: 5.0000 mg | ORAL | 0 refills | Status: DC | PRN

## 2023-03-30 MED ORDER — LORAZEPAM 2 MG/ML IJ SOLN
1.0000 mg | Freq: Once | INTRAMUSCULAR | Status: AC
  Administered 2023-03-30: 1 mg via INTRAVENOUS
  Filled 2023-03-30: qty 1

## 2023-03-30 MED ORDER — FAMOTIDINE 40 MG/5ML PO SUSR: 20.0000 mg | Freq: Once | ORAL | Status: DC

## 2023-03-30 MED ORDER — GLUCAGON HCL RDNA (DIAGNOSTIC) 1 MG IJ SOLR
1.0000 mg | Freq: Once | INTRAMUSCULAR | Status: AC
  Administered 2023-03-30: 1 mg via INTRAVENOUS
  Filled 2023-03-30: qty 1

## 2023-03-30 MED ORDER — OXYCODONE HCL 5 MG/5ML PO SOLN: 5.0000 mg | Freq: Four times a day (QID) | ORAL | 0 refills | Status: DC | PRN

## 2023-03-30 MED ORDER — GLUCAGON HCL RDNA (DIAGNOSTIC) 1 MG IJ SOLR: 1.0000 mg | Freq: Once | INTRAMUSCULAR | Status: DC

## 2023-03-30 MED ORDER — FAMOTIDINE 40 MG/5ML PO SUSR: 20.0000 mg | Freq: Every day | ORAL | 0 refills | Status: DC

## 2023-03-30 MED ORDER — FAMOTIDINE IN NACL 20-0.9 MG/50ML-% IV SOLN
20.0000 mg | Freq: Once | INTRAVENOUS | Status: AC
  Administered 2023-03-30: 20 mg via INTRAVENOUS
  Filled 2023-03-30: qty 50

## 2023-03-30 NOTE — Discharge Instructions (Signed)
I have prescribed famotidine to help with swallowing issues.  Talk with your primary care doctor if you are not improving.  You may need referral to gastroenterologist.

## 2023-03-30 NOTE — ED Notes (Signed)
Pt. States that she wears 5L O2 at home.  She was placed on 5L via  when roomed.

## 2023-03-30 NOTE — ED Notes (Signed)
Pt PO challenged with water per MD order. No issues swallowing at this time, will continue to monitor.

## 2023-03-30 NOTE — Telephone Encounter (Signed)
Re-sent pain med script.

## 2023-03-30 NOTE — ED Triage Notes (Signed)
2 nights ago while taking her meds she felt like her meds were stuck , pt tried using carafate, and gi cocktail with no relief. The feeling is still there. Pt states she can swallow liquids, hurts when tries to swallow food

## 2023-03-30 NOTE — ED Provider Notes (Signed)
[place] EMERGENCY DEPARTMENT AT [facility] Provider Note   CSN: [id number] Arrival date & time: 03/30/23  4098     History  Chief Complaint  Patient presents with   Sore Throat    [name] is a 52 y.o. female.  Patient here with sore throat, pain with swallowing.  History of adrenal insufficiency, Addison's, heart failure on chronic 4 to 5 L of oxygen.  Last few days she has been having some pain with swallowing.  She is mostly on a soft diet but she is having discomfort when she eats food and takes her medications.  She feels like it is painful in the mid throat area.  She has been able to drink fluids but with some discomfort.  It has been hard to keep down her soft diet.  She is followed by palliative care and hospice.  She had recent admission for sepsis.  She denies any fever or chills.  She takes Dexilant and Carafate for chronic ulcers which she feels like this could be an ulcer as well but Carafate has not helped.  The history is provided by the patient.       Home Medications Prior to Admission medications   Medication Sig Start Date End Date Taking? Authorizing Provider  famotidine (PEPCID) 40 MG/5ML suspension Take 2.5 mLs (20 mg total) by mouth daily. 03/30/23 04/29/23 Yes Teghan Philbin, DO  albuterol (PROVENTIL) (2.5 MG/3ML) 0.083% nebulizer solution Take 3 mLs (2.5 mg total) by nebulization every 6 (six) hours as needed for wheezing or shortness of breath. 07/09/22 03/09/23  Pokhrel, Rebekah Chesterfield, MD  aspirin EC 81 MG tablet Take 1 tablet (81 mg total) by mouth daily. Swallow whole. Patient taking differently: Take 81 mg by mouth at bedtime. Chewable 07/16/22   Nahser, Deloris Ping, MD  Bismuth Tribromoph-Petrolatum (XEROFORM OCCLUSIVE GAUZE STRIP) PADS Apply 1 each topically as directed. 09/17/22   Rolly Salter, MD  bumetanide (BUMEX) 1 MG tablet TAKE 1 TABLET BY MOUTH TWICE A DAY Patient taking differently: Take 1 mg by mouth 2 (two) times daily. 11/20/22    Nahser, Deloris Ping, MD  buPROPion (WELLBUTRIN XL) 150 MG 24 hr tablet TAKE 1 TABLET BY MOUTH EVERY DAY 02/07/23   Mozingo, Thereasa Solo, NP  clonazePAM (KLONOPIN) 1 MG tablet Take 1 tablet (1 mg total) by mouth 2 (two) times daily. Patient taking differently: Take 1 mg by mouth in the morning, at noon, and at bedtime. 02/15/23   Mozingo, Thereasa Solo, NP  dexlansoprazole (DEXILANT) 60 MG capsule Take 60 mg by mouth daily.    [provider]  Diclofenac Potassium,Migraine, (CAMBIA) 50 MG PACK Take 50 mg by mouth daily as needed for migraine. 05/16/21   [provider]  diphenhydrAMINE (BENADRYL) 12.5 MG/5ML elixir Take 10 mLs (25 mg total) by mouth every 6 (six) hours as needed (nausea). 09/17/22   Rolly Salter, MD  dronabinol (MARINOL) 2.5 MG capsule TAKE 1 CAPSULE BY MOUTH TWICE A DAY AS NEEDED NAUSEA Patient taking differently: Take 2.5 mg by mouth at bedtime. 09/10/22   Serena Croissant, MD  EMGALITY 120 MG/ML SOSY Inject 120 mg into the skin every 28 (twenty-eight) days.    [provider]  EPINEPHRINE 0.3 mg/0.3 mL IJ SOAJ injection INJECT 0.3 MG INTO THE MUSCLE AS NEEDED FOR ANAPHYLAXIS. 01/21/23   Luciano Cutter, MD  escitalopram (LEXAPRO) 20 MG tablet TAKE 1 TABLET BY MOUTH EVERY DAY IN THE EVENING Patient taking differently: Take 20 mg by mouth  every evening. 03/08/23   Mozingo, Thereasa Solo, NP  FLORASTOR 250 MG capsule Take 250 mg by mouth 2 (two) times daily.    [provider]  fluticasone-salmeterol (ADVAIR HFA) 230-21 MCG/ACT inhaler INHALE 2 PUFFS INTO THE LUNGS TWICE A DAY Patient taking differently: Inhale 2 puffs into the lungs 2 (two) times daily. 08/15/22   Luciano Cutter, MD  guaiFENesin (ROBITUSSIN) 100 MG/5ML liquid Take 15 mLs by mouth every 6 (six) hours as needed for cough or to loosen phlegm. 07/09/22   Pokhrel, Rebekah Chesterfield, MD  Heparin Na, Pork, Lock Flsh PF (BD HEPARIN POSIFLUSH) 100 UNIT/ML SOLN USE 5 MLS IN PORT A CATH ONCE DAILY  AFTER MEDICATION ADMINISTRATION AS A HEPLOCK AS DIRECTED. 03/05/23     hydrocortisone (CORTEF) 10 MG tablet Take 1-2 tablets (10-20 mg total) by mouth See admin instructions. Take 20 mg in the am and 10mg  in the evening. Take after completing prednisone 09/29/22   Rolly Salter, MD  HYDROmorphone (DILAUDID) 4 MG tablet Take 1 tablet (4 mg total) by mouth every 4 (four) hours as needed. Patient taking differently: Take 4 mg by mouth every 4 (four) hours as needed for moderate pain. 12/28/22     hydrOXYzine (ATARAX) 25 MG tablet Take 1 tablet (25 mg total) by mouth every 6 (six) hours as needed for anxiety. 01/24/23   Azucena Fallen, MD  lamoTRIgine (LAMICTAL) 200 MG tablet TAKE 1 TABLET BY MOUTH EVERYDAY AT BEDTIME Patient taking differently: Take 200 mg by mouth at bedtime. 12/24/22   Mozingo, Thereasa Solo, NP  lamoTRIgine (LAMICTAL) 25 MG tablet Take 1 tablet (25 mg total) by mouth daily. 03/04/23   Mozingo, Thereasa Solo, NP  Lancets [facility] DELICA PLUS [place]) MISC 3 (three) times daily. for testing 06/12/21   [provider]  LORazepam (ATIVAN) 0.5 MG tablet Take 1 tablet (0.5 mg total) by mouth 3 (three) times daily as needed for anxiety. 02/11/23   Mozingo, Thereasa Solo, NP  menthol-cetylpyridinium (CEPACOL) 3 MG lozenge Take 1 lozenge (3 mg total) by mouth as needed for sore throat. 09/17/22   Rolly Salter, MD  nitroGLYCERIN (NITROSTAT) 0.4 MG SL tablet Dissolve 1 tablet under the tongue every 5 minutes as needed for chest pain. Max of 3 doses, then 911. 08/20/22   Nahser, Deloris Ping, MD  [facility] VERIO test strip 3 (three) times daily. for testing 06/12/21   [provider]  OXYGEN Inhale 4-5 L/min into the lungs continuous.    [provider]  PROAIR HFA 108 [phone] Base) MCG/ACT inhaler Inhale 2 puffs into the lungs every 4 (four) hours as needed for wheezing or shortness of breath.    [provider]  QVAR REDIHALER 80 MCG/ACT inhaler INHALE 1  PUFF INTO THE LUNGS TWICE A DAY Patient taking differently: Inhale 1 puff into the lungs 2 (two) times daily. 01/21/23   Luciano Cutter, MD  Rimegepant Sulfate (NURTEC) 75 MG TBDP Take 75 mg by mouth daily as needed (Migraine).     [provider]  rosuvastatin (CRESTOR) 10 MG tablet Take 10 mg by mouth daily. Mid morning 02/28/18   [provider]  sacubitril-valsartan (ENTRESTO) 24-26 MG Take 1 tablet by mouth 2 (two) times daily. 09/22/22   Rolly Salter, MD  silver sulfADIAZINE (SILVADENE) 1 % cream Apply to affected area daily Patient taking differently: Apply 1 Application topically daily as needed. 09/17/22 09/17/23  Rolly Salter, MD  Sodium Chloride Flush (NORMAL SALINE FLUSH) 0.9 %  SOLN Use as directed for central venous catheter maintenance 03/05/23     Sodium Chloride Flush (NORMAL SALINE FLUSH) 0.9 % SOLN Use as directed for central venous catheter maintenance 01/31/23     sucralfate (CARAFATE) 1 GM/10ML suspension Take 1 g by mouth daily as needed (as directed for ulcers).    [provider]  SYNTHROID 100 MCG tablet Take 1 tablet (100 mcg total) by mouth every morning. Patient taking differently: Take 100 mcg by mouth every morning. * Must be name brand* 10/19/21   Serena Croissant, MD  Tiotropium Bromide Monohydrate (SPIRIVA RESPIMAT) 1.25 MCG/ACT AERS Inhale 2 puffs into the lungs daily. 08/15/22   Luciano Cutter, MD  topiramate (TOPAMAX) 50 MG tablet Take 150 mg by mouth at bedtime.  12/25/19   [provider]  zolpidem (AMBIEN CR) 12.5 MG CR tablet Take 12.5 mg by mouth at bedtime. 03/05/22   [provider]      Allergies    Ferrous bisglycinate chelate [iron], Mushroom extract complex, Na ferric gluc cplx in sucrose, Cymbalta [duloxetine hcl], Hydromorphone, Ondansetron hcl, Promethazine, Succinylcholine, Buprenorphine hcl, Compazine, Duloxetine, Metoclopramide, Morphine and codeine, Ondansetron, Promethazine hcl, and Tegaderm ag mesh  [silver]    Review of Systems   Review of Systems  Physical Exam Updated Vital Signs BP 136/86   Pulse (!) 101   Temp 97.6 F (36.4 C) (Oral)   Resp 20   LMP  (LMP Unknown)   SpO2 100%  Physical Exam Vitals and nursing note reviewed.  Constitutional:      General: She is not in acute distress.    Appearance: She is well-developed. She is not ill-appearing.  HENT:     Head: Normocephalic and atraumatic.     Mouth/Throat:     Mouth: Mucous membranes are moist.     Pharynx: Posterior oropharyngeal erythema present. No oropharyngeal exudate.     Tonsils: No tonsillar exudate.  Eyes:     Conjunctiva/sclera: Conjunctivae normal.  Cardiovascular:     Rate and Rhythm: Normal rate and regular rhythm.     Heart sounds: Normal heart sounds. No murmur heard. Pulmonary:     Effort: Pulmonary effort is normal. No respiratory distress.     Breath sounds: Normal breath sounds.  Abdominal:     Palpations: Abdomen is soft.     Tenderness: There is no abdominal tenderness.  Musculoskeletal:        General: No swelling.     Cervical back: Neck supple.  Skin:    General: Skin is warm and dry.     Capillary Refill: Capillary refill takes less than 2 seconds.  Neurological:     Mental Status: She is alert.  Psychiatric:        Mood and Affect: Mood normal.     ED Results / Procedures / Treatments   Labs (all labs ordered are listed, but only abnormal results are displayed) Labs Reviewed  GROUP A STREP BY PCR    EKG None  Radiology DG Chest Portable 1 View  Result Date: 03/30/2023 CLINICAL DATA:  Chest pain 2 days. EXAM: PORTABLE CHEST 1 VIEW COMPARISON:  03/09/2023 FINDINGS: Right IJ central venous catheter has tip just below the cavoatrial junction unchanged. Lungs are hypoinflated with persistent left base opacification which may be due to atelectasis or infection. Minimal hazy prominence of the central pulmonary vessels with interval improvement compatible with improved  vascular congestion. Cardiomediastinal silhouette and remainder of the exam is unchanged. IMPRESSION: 1. Hypoinflation with persistent left  base opacification which may be due to atelectasis or infection. 2. Improved vascular congestion. Electronically Signed   By: Elberta Fortis M.D.   On: 03/30/2023 10:19    Procedures Procedures    Medications Ordered in ED Medications  famotidine (PEPCID) IVPB 20 mg premix (20 mg Intravenous New Bag/Given 03/30/23 1008)  glucagon (human recombinant) (GLUCAGEN) injection 1 mg (1 mg Intravenous Given 03/30/23 1000)  LORazepam (ATIVAN) injection 1 mg (1 mg Intravenous Given 03/30/23 1015)    ED Course/ Medical Decision Making/ A&P                             Medical Decision Making Amount and/or Complexity of Data Reviewed Radiology: ordered.  Risk Prescription drug management.   ELZABETH MINTEER is here with sore throat, pain with swallowing.  History of adrenal insufficiency, Addison's disease, CHF, chronic respiratory failure, breast cancer, thyroid cancer, cervical cancer, Hodgkin's lymphoma.  Patient follows with palliative and hospice.  Overall normal vitals.  No fever.  She has been having increased pain with eating for the last 2 days.  Seems to have started after taking some meds few days ago.  She takes Dexilant and Carafate for similar issues and ulcers in the past.  She denies any black or bloody stools.  She is already on basically a dysphagia/soft diet.  She is having a hard time keeping pills down/soft diet down despite taking Dexilant and Carafate.  When she drinks liquids she feels pain but does not seem like she spits it back up.  Overall her throat does appear red in the back but she does not have any obvious signs of infectious process.  She has no stridor.  Clear breath sounds.  Differential diagnosis could be ongoing reflux/esophageal spasm discomfort, seems less likely to be impaction given that she does not really eat solids.  Not sure  there would be a pill esophagitis.  Ultimately we will try to give her some more support with famotidine, Ativan, glucagon.  She states that this happened once few years back and seemed to help with those meds specially maybe with glucagon.  Overall we will give her these meds and have her drink fluids and see how she does.  Overall she is feeling much better after treatments.  X-ray with no acute findings.  Strep test negative.  Will add famotidine and have her follow-up with primary care.  I have very low suspicion for food bolus.  She is able to drink water without much issues.  Understands return precautions.  This chart was dictated using voice recognition software.  Despite best efforts to proofread,  errors can occur which can change the documentation meaning.         Final Clinical Impression(s) / ED Diagnoses Final diagnoses:  Pain with swallowing    Rx / DC Orders ED Discharge Orders          Ordered    famotidine (PEPCID) 40 MG/5ML suspension  Daily        03/30/23 0944              Virgina Norfolk, DO 03/30/23 1114

## 2023-03-31 DIAGNOSIS — E785 Hyperlipidemia, unspecified: Secondary | ICD-10-CM | POA: Diagnosis not present

## 2023-03-31 DIAGNOSIS — E271 Primary adrenocortical insufficiency: Secondary | ICD-10-CM | POA: Diagnosis not present

## 2023-03-31 DIAGNOSIS — R1310 Dysphagia, unspecified: Secondary | ICD-10-CM | POA: Diagnosis not present

## 2023-03-31 DIAGNOSIS — E890 Postprocedural hypothyroidism: Secondary | ICD-10-CM | POA: Diagnosis not present

## 2023-03-31 DIAGNOSIS — J189 Pneumonia, unspecified organism: Secondary | ICD-10-CM | POA: Diagnosis not present

## 2023-03-31 DIAGNOSIS — C73 Malignant neoplasm of thyroid gland: Secondary | ICD-10-CM | POA: Diagnosis not present

## 2023-03-31 DIAGNOSIS — J45909 Unspecified asthma, uncomplicated: Secondary | ICD-10-CM | POA: Diagnosis not present

## 2023-03-31 DIAGNOSIS — K219 Gastro-esophageal reflux disease without esophagitis: Secondary | ICD-10-CM | POA: Diagnosis not present

## 2023-03-31 DIAGNOSIS — F32A Depression, unspecified: Secondary | ICD-10-CM | POA: Diagnosis not present

## 2023-03-31 DIAGNOSIS — K2210 Ulcer of esophagus without bleeding: Secondary | ICD-10-CM | POA: Diagnosis not present

## 2023-03-31 DIAGNOSIS — N1831 Chronic kidney disease, stage 3a: Secondary | ICD-10-CM | POA: Diagnosis not present

## 2023-03-31 DIAGNOSIS — R634 Abnormal weight loss: Secondary | ICD-10-CM | POA: Diagnosis not present

## 2023-03-31 DIAGNOSIS — I5030 Unspecified diastolic (congestive) heart failure: Secondary | ICD-10-CM | POA: Diagnosis not present

## 2023-03-31 DIAGNOSIS — J9611 Chronic respiratory failure with hypoxia: Secondary | ICD-10-CM | POA: Diagnosis not present

## 2023-04-01 ENCOUNTER — Ambulatory Visit: Payer: Medicare Other | Admitting: Psychiatry

## 2023-04-01 ENCOUNTER — Other Ambulatory Visit (HOSPITAL_COMMUNITY): Payer: Self-pay | Admitting: Endocrinology

## 2023-04-01 ENCOUNTER — Other Ambulatory Visit: Payer: Self-pay

## 2023-04-01 ENCOUNTER — Other Ambulatory Visit (HOSPITAL_COMMUNITY): Payer: Self-pay

## 2023-04-01 DIAGNOSIS — E890 Postprocedural hypothyroidism: Secondary | ICD-10-CM | POA: Diagnosis not present

## 2023-04-01 DIAGNOSIS — R52 Pain, unspecified: Secondary | ICD-10-CM

## 2023-04-01 DIAGNOSIS — C73 Malignant neoplasm of thyroid gland: Secondary | ICD-10-CM | POA: Diagnosis not present

## 2023-04-01 DIAGNOSIS — E271 Primary adrenocortical insufficiency: Secondary | ICD-10-CM | POA: Diagnosis not present

## 2023-04-01 DIAGNOSIS — J189 Pneumonia, unspecified organism: Secondary | ICD-10-CM | POA: Diagnosis not present

## 2023-04-01 DIAGNOSIS — I5030 Unspecified diastolic (congestive) heart failure: Secondary | ICD-10-CM | POA: Diagnosis not present

## 2023-04-01 DIAGNOSIS — R634 Abnormal weight loss: Secondary | ICD-10-CM | POA: Diagnosis not present

## 2023-04-01 DIAGNOSIS — R1310 Dysphagia, unspecified: Secondary | ICD-10-CM | POA: Diagnosis not present

## 2023-04-01 DIAGNOSIS — K219 Gastro-esophageal reflux disease without esophagitis: Secondary | ICD-10-CM | POA: Diagnosis not present

## 2023-04-01 DIAGNOSIS — F32A Depression, unspecified: Secondary | ICD-10-CM | POA: Diagnosis not present

## 2023-04-01 DIAGNOSIS — N1831 Chronic kidney disease, stage 3a: Secondary | ICD-10-CM | POA: Diagnosis not present

## 2023-04-01 DIAGNOSIS — R509 Fever, unspecified: Secondary | ICD-10-CM

## 2023-04-01 DIAGNOSIS — J45909 Unspecified asthma, uncomplicated: Secondary | ICD-10-CM | POA: Diagnosis not present

## 2023-04-01 DIAGNOSIS — E785 Hyperlipidemia, unspecified: Secondary | ICD-10-CM | POA: Diagnosis not present

## 2023-04-01 DIAGNOSIS — J9611 Chronic respiratory failure with hypoxia: Secondary | ICD-10-CM | POA: Diagnosis not present

## 2023-04-01 DIAGNOSIS — K2210 Ulcer of esophagus without bleeding: Secondary | ICD-10-CM | POA: Diagnosis not present

## 2023-04-02 ENCOUNTER — Telehealth: Payer: Medicare Other | Admitting: Adult Health

## 2023-04-02 ENCOUNTER — Other Ambulatory Visit: Payer: Self-pay | Admitting: Student

## 2023-04-02 ENCOUNTER — Other Ambulatory Visit: Payer: Self-pay

## 2023-04-02 DIAGNOSIS — E890 Postprocedural hypothyroidism: Secondary | ICD-10-CM | POA: Diagnosis not present

## 2023-04-02 DIAGNOSIS — J189 Pneumonia, unspecified organism: Secondary | ICD-10-CM | POA: Diagnosis not present

## 2023-04-02 DIAGNOSIS — J9611 Chronic respiratory failure with hypoxia: Secondary | ICD-10-CM | POA: Diagnosis not present

## 2023-04-02 DIAGNOSIS — F32A Depression, unspecified: Secondary | ICD-10-CM | POA: Diagnosis not present

## 2023-04-02 DIAGNOSIS — J45909 Unspecified asthma, uncomplicated: Secondary | ICD-10-CM | POA: Diagnosis not present

## 2023-04-02 DIAGNOSIS — R634 Abnormal weight loss: Secondary | ICD-10-CM | POA: Diagnosis not present

## 2023-04-02 DIAGNOSIS — N1831 Chronic kidney disease, stage 3a: Secondary | ICD-10-CM | POA: Diagnosis not present

## 2023-04-02 DIAGNOSIS — E785 Hyperlipidemia, unspecified: Secondary | ICD-10-CM | POA: Diagnosis not present

## 2023-04-02 DIAGNOSIS — K219 Gastro-esophageal reflux disease without esophagitis: Secondary | ICD-10-CM | POA: Diagnosis not present

## 2023-04-02 DIAGNOSIS — K2210 Ulcer of esophagus without bleeding: Secondary | ICD-10-CM | POA: Diagnosis not present

## 2023-04-02 DIAGNOSIS — C73 Malignant neoplasm of thyroid gland: Secondary | ICD-10-CM | POA: Diagnosis not present

## 2023-04-02 DIAGNOSIS — R1310 Dysphagia, unspecified: Secondary | ICD-10-CM | POA: Diagnosis not present

## 2023-04-02 DIAGNOSIS — E271 Primary adrenocortical insufficiency: Secondary | ICD-10-CM | POA: Diagnosis not present

## 2023-04-02 DIAGNOSIS — I5030 Unspecified diastolic (congestive) heart failure: Secondary | ICD-10-CM | POA: Diagnosis not present

## 2023-04-03 ENCOUNTER — Ambulatory Visit (HOSPITAL_COMMUNITY): Admission: RE | Admit: 2023-04-03 | Payer: BC Managed Care – PPO | Source: Ambulatory Visit

## 2023-04-03 DIAGNOSIS — E271 Primary adrenocortical insufficiency: Secondary | ICD-10-CM | POA: Diagnosis not present

## 2023-04-03 DIAGNOSIS — J189 Pneumonia, unspecified organism: Secondary | ICD-10-CM | POA: Diagnosis not present

## 2023-04-03 DIAGNOSIS — E890 Postprocedural hypothyroidism: Secondary | ICD-10-CM | POA: Diagnosis not present

## 2023-04-03 DIAGNOSIS — N1831 Chronic kidney disease, stage 3a: Secondary | ICD-10-CM | POA: Diagnosis not present

## 2023-04-03 DIAGNOSIS — R634 Abnormal weight loss: Secondary | ICD-10-CM | POA: Diagnosis not present

## 2023-04-03 DIAGNOSIS — F32A Depression, unspecified: Secondary | ICD-10-CM | POA: Diagnosis not present

## 2023-04-03 DIAGNOSIS — E785 Hyperlipidemia, unspecified: Secondary | ICD-10-CM | POA: Diagnosis not present

## 2023-04-03 DIAGNOSIS — I5030 Unspecified diastolic (congestive) heart failure: Secondary | ICD-10-CM | POA: Diagnosis not present

## 2023-04-03 DIAGNOSIS — R1310 Dysphagia, unspecified: Secondary | ICD-10-CM | POA: Diagnosis not present

## 2023-04-03 DIAGNOSIS — J45909 Unspecified asthma, uncomplicated: Secondary | ICD-10-CM | POA: Diagnosis not present

## 2023-04-03 DIAGNOSIS — C73 Malignant neoplasm of thyroid gland: Secondary | ICD-10-CM | POA: Diagnosis not present

## 2023-04-03 DIAGNOSIS — K2210 Ulcer of esophagus without bleeding: Secondary | ICD-10-CM | POA: Diagnosis not present

## 2023-04-03 DIAGNOSIS — J9611 Chronic respiratory failure with hypoxia: Secondary | ICD-10-CM | POA: Diagnosis not present

## 2023-04-03 DIAGNOSIS — K219 Gastro-esophageal reflux disease without esophagitis: Secondary | ICD-10-CM | POA: Diagnosis not present

## 2023-04-04 DIAGNOSIS — F32A Depression, unspecified: Secondary | ICD-10-CM | POA: Diagnosis not present

## 2023-04-04 DIAGNOSIS — E271 Primary adrenocortical insufficiency: Secondary | ICD-10-CM | POA: Diagnosis not present

## 2023-04-04 DIAGNOSIS — J189 Pneumonia, unspecified organism: Secondary | ICD-10-CM | POA: Diagnosis not present

## 2023-04-04 DIAGNOSIS — N1831 Chronic kidney disease, stage 3a: Secondary | ICD-10-CM | POA: Diagnosis not present

## 2023-04-04 DIAGNOSIS — K219 Gastro-esophageal reflux disease without esophagitis: Secondary | ICD-10-CM | POA: Diagnosis not present

## 2023-04-04 DIAGNOSIS — E785 Hyperlipidemia, unspecified: Secondary | ICD-10-CM | POA: Diagnosis not present

## 2023-04-04 DIAGNOSIS — K2210 Ulcer of esophagus without bleeding: Secondary | ICD-10-CM | POA: Diagnosis not present

## 2023-04-04 DIAGNOSIS — R634 Abnormal weight loss: Secondary | ICD-10-CM | POA: Diagnosis not present

## 2023-04-04 DIAGNOSIS — I5030 Unspecified diastolic (congestive) heart failure: Secondary | ICD-10-CM | POA: Diagnosis not present

## 2023-04-04 DIAGNOSIS — J9611 Chronic respiratory failure with hypoxia: Secondary | ICD-10-CM | POA: Diagnosis not present

## 2023-04-04 DIAGNOSIS — R1310 Dysphagia, unspecified: Secondary | ICD-10-CM | POA: Diagnosis not present

## 2023-04-04 DIAGNOSIS — E890 Postprocedural hypothyroidism: Secondary | ICD-10-CM | POA: Diagnosis not present

## 2023-04-04 DIAGNOSIS — J45909 Unspecified asthma, uncomplicated: Secondary | ICD-10-CM | POA: Diagnosis not present

## 2023-04-04 DIAGNOSIS — C73 Malignant neoplasm of thyroid gland: Secondary | ICD-10-CM | POA: Diagnosis not present

## 2023-04-05 ENCOUNTER — Other Ambulatory Visit (HOSPITAL_COMMUNITY): Payer: Self-pay

## 2023-04-05 DIAGNOSIS — E785 Hyperlipidemia, unspecified: Secondary | ICD-10-CM | POA: Diagnosis not present

## 2023-04-05 DIAGNOSIS — F32A Depression, unspecified: Secondary | ICD-10-CM | POA: Diagnosis not present

## 2023-04-05 DIAGNOSIS — E271 Primary adrenocortical insufficiency: Secondary | ICD-10-CM | POA: Diagnosis not present

## 2023-04-05 DIAGNOSIS — J45909 Unspecified asthma, uncomplicated: Secondary | ICD-10-CM | POA: Diagnosis not present

## 2023-04-05 DIAGNOSIS — K2210 Ulcer of esophagus without bleeding: Secondary | ICD-10-CM | POA: Diagnosis not present

## 2023-04-05 DIAGNOSIS — R634 Abnormal weight loss: Secondary | ICD-10-CM | POA: Diagnosis not present

## 2023-04-05 DIAGNOSIS — K219 Gastro-esophageal reflux disease without esophagitis: Secondary | ICD-10-CM | POA: Diagnosis not present

## 2023-04-05 DIAGNOSIS — J9611 Chronic respiratory failure with hypoxia: Secondary | ICD-10-CM | POA: Diagnosis not present

## 2023-04-05 DIAGNOSIS — C73 Malignant neoplasm of thyroid gland: Secondary | ICD-10-CM | POA: Diagnosis not present

## 2023-04-05 DIAGNOSIS — E890 Postprocedural hypothyroidism: Secondary | ICD-10-CM | POA: Diagnosis not present

## 2023-04-05 DIAGNOSIS — I5030 Unspecified diastolic (congestive) heart failure: Secondary | ICD-10-CM | POA: Diagnosis not present

## 2023-04-05 DIAGNOSIS — J189 Pneumonia, unspecified organism: Secondary | ICD-10-CM | POA: Diagnosis not present

## 2023-04-05 DIAGNOSIS — R1310 Dysphagia, unspecified: Secondary | ICD-10-CM | POA: Diagnosis not present

## 2023-04-05 DIAGNOSIS — N1831 Chronic kidney disease, stage 3a: Secondary | ICD-10-CM | POA: Diagnosis not present

## 2023-04-05 MED ORDER — OXYCODONE HCL 5 MG PO TABS
5.0000 mg | ORAL_TABLET | ORAL | 0 refills | Status: DC | PRN
  Filled 2023-04-05 – 2023-04-23 (×2): qty 60, 10d supply, fill #0

## 2023-04-06 DIAGNOSIS — F32A Depression, unspecified: Secondary | ICD-10-CM | POA: Diagnosis not present

## 2023-04-06 DIAGNOSIS — E890 Postprocedural hypothyroidism: Secondary | ICD-10-CM | POA: Diagnosis not present

## 2023-04-06 DIAGNOSIS — J189 Pneumonia, unspecified organism: Secondary | ICD-10-CM | POA: Diagnosis not present

## 2023-04-06 DIAGNOSIS — R634 Abnormal weight loss: Secondary | ICD-10-CM | POA: Diagnosis not present

## 2023-04-06 DIAGNOSIS — R1310 Dysphagia, unspecified: Secondary | ICD-10-CM | POA: Diagnosis not present

## 2023-04-06 DIAGNOSIS — E271 Primary adrenocortical insufficiency: Secondary | ICD-10-CM | POA: Diagnosis not present

## 2023-04-06 DIAGNOSIS — K2210 Ulcer of esophagus without bleeding: Secondary | ICD-10-CM | POA: Diagnosis not present

## 2023-04-06 DIAGNOSIS — N1831 Chronic kidney disease, stage 3a: Secondary | ICD-10-CM | POA: Diagnosis not present

## 2023-04-06 DIAGNOSIS — E785 Hyperlipidemia, unspecified: Secondary | ICD-10-CM | POA: Diagnosis not present

## 2023-04-06 DIAGNOSIS — J9611 Chronic respiratory failure with hypoxia: Secondary | ICD-10-CM | POA: Diagnosis not present

## 2023-04-06 DIAGNOSIS — C73 Malignant neoplasm of thyroid gland: Secondary | ICD-10-CM | POA: Diagnosis not present

## 2023-04-06 DIAGNOSIS — J45909 Unspecified asthma, uncomplicated: Secondary | ICD-10-CM | POA: Diagnosis not present

## 2023-04-06 DIAGNOSIS — I5030 Unspecified diastolic (congestive) heart failure: Secondary | ICD-10-CM | POA: Diagnosis not present

## 2023-04-06 DIAGNOSIS — K219 Gastro-esophageal reflux disease without esophagitis: Secondary | ICD-10-CM | POA: Diagnosis not present

## 2023-04-07 DIAGNOSIS — J9611 Chronic respiratory failure with hypoxia: Secondary | ICD-10-CM | POA: Diagnosis not present

## 2023-04-07 DIAGNOSIS — C73 Malignant neoplasm of thyroid gland: Secondary | ICD-10-CM | POA: Diagnosis not present

## 2023-04-07 DIAGNOSIS — E271 Primary adrenocortical insufficiency: Secondary | ICD-10-CM | POA: Diagnosis not present

## 2023-04-07 DIAGNOSIS — K219 Gastro-esophageal reflux disease without esophagitis: Secondary | ICD-10-CM | POA: Diagnosis not present

## 2023-04-07 DIAGNOSIS — E890 Postprocedural hypothyroidism: Secondary | ICD-10-CM | POA: Diagnosis not present

## 2023-04-07 DIAGNOSIS — R634 Abnormal weight loss: Secondary | ICD-10-CM | POA: Diagnosis not present

## 2023-04-07 DIAGNOSIS — J45909 Unspecified asthma, uncomplicated: Secondary | ICD-10-CM | POA: Diagnosis not present

## 2023-04-07 DIAGNOSIS — N1831 Chronic kidney disease, stage 3a: Secondary | ICD-10-CM | POA: Diagnosis not present

## 2023-04-07 DIAGNOSIS — F32A Depression, unspecified: Secondary | ICD-10-CM | POA: Diagnosis not present

## 2023-04-07 DIAGNOSIS — K2210 Ulcer of esophagus without bleeding: Secondary | ICD-10-CM | POA: Diagnosis not present

## 2023-04-07 DIAGNOSIS — I5030 Unspecified diastolic (congestive) heart failure: Secondary | ICD-10-CM | POA: Diagnosis not present

## 2023-04-07 DIAGNOSIS — E785 Hyperlipidemia, unspecified: Secondary | ICD-10-CM | POA: Diagnosis not present

## 2023-04-07 DIAGNOSIS — R1310 Dysphagia, unspecified: Secondary | ICD-10-CM | POA: Diagnosis not present

## 2023-04-07 DIAGNOSIS — J189 Pneumonia, unspecified organism: Secondary | ICD-10-CM | POA: Diagnosis not present

## 2023-04-08 ENCOUNTER — Other Ambulatory Visit (HOSPITAL_BASED_OUTPATIENT_CLINIC_OR_DEPARTMENT_OTHER): Payer: Self-pay | Admitting: Pulmonary Disease

## 2023-04-08 DIAGNOSIS — R1310 Dysphagia, unspecified: Secondary | ICD-10-CM | POA: Diagnosis not present

## 2023-04-08 DIAGNOSIS — K2210 Ulcer of esophagus without bleeding: Secondary | ICD-10-CM | POA: Diagnosis not present

## 2023-04-08 DIAGNOSIS — F32A Depression, unspecified: Secondary | ICD-10-CM | POA: Diagnosis not present

## 2023-04-08 DIAGNOSIS — J189 Pneumonia, unspecified organism: Secondary | ICD-10-CM | POA: Diagnosis not present

## 2023-04-08 DIAGNOSIS — J45909 Unspecified asthma, uncomplicated: Secondary | ICD-10-CM | POA: Diagnosis not present

## 2023-04-08 DIAGNOSIS — E271 Primary adrenocortical insufficiency: Secondary | ICD-10-CM | POA: Diagnosis not present

## 2023-04-08 DIAGNOSIS — N1831 Chronic kidney disease, stage 3a: Secondary | ICD-10-CM | POA: Diagnosis not present

## 2023-04-08 DIAGNOSIS — E785 Hyperlipidemia, unspecified: Secondary | ICD-10-CM | POA: Diagnosis not present

## 2023-04-08 DIAGNOSIS — R634 Abnormal weight loss: Secondary | ICD-10-CM | POA: Diagnosis not present

## 2023-04-08 DIAGNOSIS — K219 Gastro-esophageal reflux disease without esophagitis: Secondary | ICD-10-CM | POA: Diagnosis not present

## 2023-04-08 DIAGNOSIS — E890 Postprocedural hypothyroidism: Secondary | ICD-10-CM | POA: Diagnosis not present

## 2023-04-08 DIAGNOSIS — I5030 Unspecified diastolic (congestive) heart failure: Secondary | ICD-10-CM | POA: Diagnosis not present

## 2023-04-08 DIAGNOSIS — C73 Malignant neoplasm of thyroid gland: Secondary | ICD-10-CM | POA: Diagnosis not present

## 2023-04-08 DIAGNOSIS — J9611 Chronic respiratory failure with hypoxia: Secondary | ICD-10-CM | POA: Diagnosis not present

## 2023-04-09 ENCOUNTER — Ambulatory Visit (HOSPITAL_COMMUNITY): Admission: RE | Admit: 2023-04-09 | Payer: BC Managed Care – PPO | Source: Ambulatory Visit

## 2023-04-09 ENCOUNTER — Encounter (HOSPITAL_COMMUNITY): Payer: Self-pay

## 2023-04-09 DIAGNOSIS — F32A Depression, unspecified: Secondary | ICD-10-CM | POA: Diagnosis not present

## 2023-04-09 DIAGNOSIS — C73 Malignant neoplasm of thyroid gland: Secondary | ICD-10-CM | POA: Diagnosis not present

## 2023-04-09 DIAGNOSIS — R634 Abnormal weight loss: Secondary | ICD-10-CM | POA: Diagnosis not present

## 2023-04-09 DIAGNOSIS — R1310 Dysphagia, unspecified: Secondary | ICD-10-CM | POA: Diagnosis not present

## 2023-04-09 DIAGNOSIS — E785 Hyperlipidemia, unspecified: Secondary | ICD-10-CM | POA: Diagnosis not present

## 2023-04-09 DIAGNOSIS — E271 Primary adrenocortical insufficiency: Secondary | ICD-10-CM | POA: Diagnosis not present

## 2023-04-09 DIAGNOSIS — J189 Pneumonia, unspecified organism: Secondary | ICD-10-CM | POA: Diagnosis not present

## 2023-04-09 DIAGNOSIS — K2210 Ulcer of esophagus without bleeding: Secondary | ICD-10-CM | POA: Diagnosis not present

## 2023-04-09 DIAGNOSIS — N1831 Chronic kidney disease, stage 3a: Secondary | ICD-10-CM | POA: Diagnosis not present

## 2023-04-09 DIAGNOSIS — K219 Gastro-esophageal reflux disease without esophagitis: Secondary | ICD-10-CM | POA: Diagnosis not present

## 2023-04-09 DIAGNOSIS — J9611 Chronic respiratory failure with hypoxia: Secondary | ICD-10-CM | POA: Diagnosis not present

## 2023-04-09 DIAGNOSIS — E890 Postprocedural hypothyroidism: Secondary | ICD-10-CM | POA: Diagnosis not present

## 2023-04-09 DIAGNOSIS — I5030 Unspecified diastolic (congestive) heart failure: Secondary | ICD-10-CM | POA: Diagnosis not present

## 2023-04-09 DIAGNOSIS — J45909 Unspecified asthma, uncomplicated: Secondary | ICD-10-CM | POA: Diagnosis not present

## 2023-04-10 DIAGNOSIS — C73 Malignant neoplasm of thyroid gland: Secondary | ICD-10-CM | POA: Diagnosis not present

## 2023-04-10 DIAGNOSIS — F32A Depression, unspecified: Secondary | ICD-10-CM | POA: Diagnosis not present

## 2023-04-10 DIAGNOSIS — J189 Pneumonia, unspecified organism: Secondary | ICD-10-CM | POA: Diagnosis not present

## 2023-04-10 DIAGNOSIS — J9611 Chronic respiratory failure with hypoxia: Secondary | ICD-10-CM | POA: Diagnosis not present

## 2023-04-10 DIAGNOSIS — K2210 Ulcer of esophagus without bleeding: Secondary | ICD-10-CM | POA: Diagnosis not present

## 2023-04-10 DIAGNOSIS — E890 Postprocedural hypothyroidism: Secondary | ICD-10-CM | POA: Diagnosis not present

## 2023-04-10 DIAGNOSIS — K219 Gastro-esophageal reflux disease without esophagitis: Secondary | ICD-10-CM | POA: Diagnosis not present

## 2023-04-10 DIAGNOSIS — E785 Hyperlipidemia, unspecified: Secondary | ICD-10-CM | POA: Diagnosis not present

## 2023-04-10 DIAGNOSIS — J45909 Unspecified asthma, uncomplicated: Secondary | ICD-10-CM | POA: Diagnosis not present

## 2023-04-10 DIAGNOSIS — I5030 Unspecified diastolic (congestive) heart failure: Secondary | ICD-10-CM | POA: Diagnosis not present

## 2023-04-10 DIAGNOSIS — R634 Abnormal weight loss: Secondary | ICD-10-CM | POA: Diagnosis not present

## 2023-04-10 DIAGNOSIS — E271 Primary adrenocortical insufficiency: Secondary | ICD-10-CM | POA: Diagnosis not present

## 2023-04-10 DIAGNOSIS — N1831 Chronic kidney disease, stage 3a: Secondary | ICD-10-CM | POA: Diagnosis not present

## 2023-04-10 DIAGNOSIS — R1310 Dysphagia, unspecified: Secondary | ICD-10-CM | POA: Diagnosis not present

## 2023-04-11 ENCOUNTER — Encounter: Payer: Self-pay | Admitting: Adult Health

## 2023-04-11 ENCOUNTER — Telehealth (INDEPENDENT_AMBULATORY_CARE_PROVIDER_SITE_OTHER): Admitting: Adult Health

## 2023-04-11 DIAGNOSIS — F331 Major depressive disorder, recurrent, moderate: Secondary | ICD-10-CM | POA: Diagnosis not present

## 2023-04-11 DIAGNOSIS — E890 Postprocedural hypothyroidism: Secondary | ICD-10-CM | POA: Diagnosis not present

## 2023-04-11 DIAGNOSIS — F410 Panic disorder [episodic paroxysmal anxiety] without agoraphobia: Secondary | ICD-10-CM | POA: Diagnosis not present

## 2023-04-11 DIAGNOSIS — N1831 Chronic kidney disease, stage 3a: Secondary | ICD-10-CM | POA: Diagnosis not present

## 2023-04-11 DIAGNOSIS — R634 Abnormal weight loss: Secondary | ICD-10-CM | POA: Diagnosis not present

## 2023-04-11 DIAGNOSIS — J9611 Chronic respiratory failure with hypoxia: Secondary | ICD-10-CM | POA: Diagnosis not present

## 2023-04-11 DIAGNOSIS — F32A Depression, unspecified: Secondary | ICD-10-CM | POA: Diagnosis not present

## 2023-04-11 DIAGNOSIS — J45909 Unspecified asthma, uncomplicated: Secondary | ICD-10-CM | POA: Diagnosis not present

## 2023-04-11 DIAGNOSIS — I5030 Unspecified diastolic (congestive) heart failure: Secondary | ICD-10-CM | POA: Diagnosis not present

## 2023-04-11 DIAGNOSIS — C73 Malignant neoplasm of thyroid gland: Secondary | ICD-10-CM | POA: Diagnosis not present

## 2023-04-11 DIAGNOSIS — E271 Primary adrenocortical insufficiency: Secondary | ICD-10-CM | POA: Diagnosis not present

## 2023-04-11 DIAGNOSIS — E785 Hyperlipidemia, unspecified: Secondary | ICD-10-CM | POA: Diagnosis not present

## 2023-04-11 DIAGNOSIS — K2210 Ulcer of esophagus without bleeding: Secondary | ICD-10-CM | POA: Diagnosis not present

## 2023-04-11 DIAGNOSIS — K219 Gastro-esophageal reflux disease without esophagitis: Secondary | ICD-10-CM | POA: Diagnosis not present

## 2023-04-11 DIAGNOSIS — F411 Generalized anxiety disorder: Secondary | ICD-10-CM | POA: Diagnosis not present

## 2023-04-11 DIAGNOSIS — J189 Pneumonia, unspecified organism: Secondary | ICD-10-CM | POA: Diagnosis not present

## 2023-04-11 DIAGNOSIS — G4700 Insomnia, unspecified: Secondary | ICD-10-CM | POA: Diagnosis not present

## 2023-04-11 DIAGNOSIS — R1310 Dysphagia, unspecified: Secondary | ICD-10-CM | POA: Diagnosis not present

## 2023-04-11 NOTE — Progress Notes (Signed)
[name] [id number] December 20, 1970 52 y.o.  Virtual Visit via Video Note  I connected with pt @ on 04/11/23 at  1:00 PM EDT by a video enabled telemedicine application and verified that I am speaking with the correct person using two identifiers.   I discussed the limitations of evaluation and management by telemedicine and the availability of in person appointments. The patient expressed understanding and agreed to proceed.  I discussed the assessment and treatment plan with the patient. The patient was provided an opportunity to ask questions and all were answered. The patient agreed with the plan and demonstrated an understanding of the instructions.   The patient was advised to call back or seek an in-person evaluation if the symptoms worsen or if the condition fails to improve as anticipated.  I provided 25 minutes of non-face-to-face time during this encounter.  The patient was located at home.  The provider was located at [facility].   [name], NP   Subjective:   Patient ID:  [name] is a 52 y.o. (DOB [birth year]) female.  Chief Complaint: No chief complaint on file.   HPI [name] presents for follow-up of  MDD, GAD, insomnia, and panic attacks.  Describes mood today as "not good". Pleasant. Tearful. Mood symptoms - reports depression and irritability. Reports anxiety at times. Denies panic attacks. Denies worry, rumination, and over thinking. Recently admitted to hospice care due to uncontrolled pain issues - planning to go home today. Mood is lower. Stating "I just wonder why all this is happening". Feels like medications are helpful. Family supportive. Decreased interest and motivation. Taking medications as prescribed. Working with [name] for therapy.  Energy levels low. Active, does not have a regular exercise routine with current physical disabilities. Unable to enjoy usual interests and activities. Married. Lives with husband and son  - 86. Has a daughter - 54 in [place]. Spending time with family.  Appetite decreased. Weight loss - 16 pounds. Sleeps better some nights than others.  Focus and concentration difficulties. Completing tasks. Managing aspects of household. Disabled since 2016. Denies SI or HI.  Denies AH or VH. Denies self harm. Denies substance use.   Previous medication trials: Wellbutrin - mean, Zoloft-not helpful  Review of Systems:  Review of Systems  Musculoskeletal:  Negative for gait problem.  Neurological:  Negative for tremors.  Psychiatric/Behavioral:         Please refer to HPI    Medications: I have reviewed the patient's current medications.  Current Outpatient Medications  Medication Sig Dispense Refill   albuterol (PROVENTIL) (2.5 MG/3ML) 0.083% nebulizer solution Take 3 mLs (2.5 mg total) by nebulization every 6 (six) hours as needed for wheezing or shortness of breath. 360 mL 2   aspirin EC 81 MG tablet Take 1 tablet (81 mg total) by mouth daily. Swallow whole. (Patient taking differently: Take 81 mg by mouth at bedtime. Chewable) 90 tablet 3   Bismuth Tribromoph-Petrolatum (XEROFORM OCCLUSIVE GAUZE STRIP) PADS Apply 1 each topically as directed. 50 each 1   bumetanide (BUMEX) 1 MG tablet TAKE 1 TABLET BY MOUTH TWICE A DAY (Patient taking differently: Take 1 mg by mouth 2 (two) times daily.) 180 tablet 3   buPROPion (WELLBUTRIN XL) 150 MG 24 hr tablet TAKE 1 TABLET BY MOUTH EVERY DAY 90 tablet 0   clonazePAM (KLONOPIN) 1 MG tablet Take 1 tablet (1 mg total) by mouth 2 (two) times daily. (Patient taking differently: Take 1 mg by mouth  in the morning, at noon, and at bedtime.) 60 tablet 1   dexlansoprazole (DEXILANT) 60 MG capsule Take 60 mg by mouth daily.     Diclofenac Potassium,Migraine, (CAMBIA) 50 MG PACK Take 50 mg by mouth daily as needed for migraine.     diphenhydrAMINE (BENADRYL) 12.5 MG/5ML elixir Take 10 mLs (25 mg total) by mouth every 6 (six) hours as needed (nausea). 120  mL 0   dronabinol (MARINOL) 2.5 MG capsule TAKE 1 CAPSULE BY MOUTH TWICE A DAY AS NEEDED NAUSEA (Patient taking differently: Take 2.5 mg by mouth at bedtime.) 60 capsule 0   EMGALITY 120 MG/ML SOSY Inject 120 mg into the skin every 28 (twenty-eight) days.     EPINEPHRINE 0.3 mg/0.3 mL IJ SOAJ injection INJECT 0.3 MG INTO THE MUSCLE AS NEEDED FOR ANAPHYLAXIS. 2 each 1   escitalopram (LEXAPRO) 20 MG tablet TAKE 1 TABLET BY MOUTH EVERY DAY IN THE EVENING (Patient taking differently: Take 20 mg by mouth every evening.) 90 tablet 1   famotidine (PEPCID) 40 MG/5ML suspension Take 2.5 mLs (20 mg total) by mouth daily. 75 mL 0   FLORASTOR 250 MG capsule Take 250 mg by mouth 2 (two) times daily.     fluticasone-salmeterol (ADVAIR HFA) 230-21 MCG/ACT inhaler INHALE 2 PUFFS INTO THE LUNGS TWICE A DAY (Patient taking differently: Inhale 2 puffs into the lungs 2 (two) times daily.) 12 each 5   guaiFENesin (ROBITUSSIN) 100 MG/5ML liquid Take 15 mLs by mouth every 6 (six) hours as needed for cough or to loosen phlegm. 120 mL 0   Heparin Na, Pork, Lock Flsh PF (BD HEPARIN POSIFLUSH) 100 UNIT/ML SOLN USE 5 MLS IN PORT A CATH ONCE DAILY AFTER MEDICATION ADMINISTRATION AS A HEPLOCK AS DIRECTED. 150 mL 3   hydrocortisone (CORTEF) 10 MG tablet Take 1-2 tablets (10-20 mg total) by mouth See admin instructions. Take 20 mg in the am and 10mg  in the evening. Take after completing prednisone     HYDROmorphone (DILAUDID) 4 MG tablet Take 1 tablet (4 mg total) by mouth every 4 (four) hours as needed. (Patient taking differently: Take 4 mg by mouth every 4 (four) hours as needed for moderate pain.) 90 tablet 0   hydrOXYzine (ATARAX) 25 MG tablet Take 1 tablet (25 mg total) by mouth every 6 (six) hours as needed for anxiety. 30 tablet 0   lamoTRIgine (LAMICTAL) 200 MG tablet TAKE 1 TABLET BY MOUTH EVERYDAY AT BEDTIME (Patient taking differently: Take 200 mg by mouth at bedtime.) 30 tablet 5   lamoTRIgine (LAMICTAL) 25 MG tablet  Take 1 tablet (25 mg total) by mouth daily. 30 tablet 2   Lancets (ONETOUCH DELICA PLUS LANCET33G) MISC 3 (three) times daily. for testing     LORazepam (ATIVAN) 0.5 MG tablet Take 1 tablet (0.5 mg total) by mouth 3 (three) times daily as needed for anxiety. 12 tablet 0   menthol-cetylpyridinium (CEPACOL) 3 MG lozenge Take 1 lozenge (3 mg total) by mouth as needed for sore throat. 100 tablet 0   nitroGLYCERIN (NITROSTAT) 0.4 MG SL tablet Dissolve 1 tablet under the tongue every 5 minutes as needed for chest pain. Max of 3 doses, then 911. 25 tablet 6   ONETOUCH VERIO test strip 3 (three) times daily. for testing     oxyCODONE (OXY IR/ROXICODONE) 5 MG immediate release tablet Take 1 tablet (5 mg total) by mouth every 4 (four) hours as needed for pain 60 tablet 0   oxyCODONE (ROXICODONE) 5 MG/5ML solution Take  5 mLs (5 mg total) by mouth every 4 (four) hours as needed for severe pain. 50 mL 0   OXYGEN Inhale 4-5 L/min into the lungs continuous.     PROAIR HFA 108 (90 Base) MCG/ACT inhaler Inhale 2 puffs into the lungs every 4 (four) hours as needed for wheezing or shortness of breath.     QVAR REDIHALER 80 MCG/ACT inhaler INHALE 1 PUFF INTO THE LUNGS TWICE A DAY (Patient taking differently: Inhale 1 puff into the lungs 2 (two) times daily.) 31.8 g 2   Rimegepant Sulfate (NURTEC) 75 MG TBDP Take 75 mg by mouth daily as needed (Migraine).      rosuvastatin (CRESTOR) 10 MG tablet Take 10 mg by mouth daily. Mid morning     sacubitril-valsartan (ENTRESTO) 24-26 MG Take 1 tablet by mouth 2 (two) times daily. 60 tablet 11   silver sulfADIAZINE (SILVADENE) 1 % cream Apply to affected area daily (Patient taking differently: Apply 1 Application topically daily as needed.) 50 g 1   Sodium Chloride Flush (NORMAL SALINE FLUSH) 0.9 % SOLN Use as directed for central venous catheter maintenance 600 mL 3   Sodium Chloride Flush (NORMAL SALINE FLUSH) 0.9 % SOLN Use as directed for central venous catheter maintenance  600 mL 3   sucralfate (CARAFATE) 1 GM/10ML suspension Take 1 g by mouth daily as needed (as directed for ulcers).     SYNTHROID 100 MCG tablet Take 1 tablet (100 mcg total) by mouth every morning. (Patient taking differently: Take 100 mcg by mouth every morning. * Must be name brand*)     Tiotropium Bromide Monohydrate (SPIRIVA RESPIMAT) 1.25 MCG/ACT AERS Inhale 2 puffs into the lungs daily. 4 g 5   topiramate (TOPAMAX) 50 MG tablet Take 150 mg by mouth at bedtime.      zolpidem (AMBIEN CR) 12.5 MG CR tablet Take 12.5 mg by mouth at bedtime.     No current facility-administered medications for this visit.    Medication Side Effects: None  Allergies:  Allergies  Allergen Reactions   Ferrous Bisglycinate Chelate [Iron] Anaphylaxis and Other (See Comments)    Only IV - only FERRLICET   Mushroom Extract Complex Anaphylaxis   Na Ferric Gluc Cplx In Sucrose Anaphylaxis   Cymbalta [Duloxetine Hcl] Swelling and Anxiety   Hydromorphone Other (See Comments)    BP drop and heart rate drops 5.1.20 PT REPORTS THAT SHE TAKES DILAUDID AT HOME   Ondansetron Hcl Other (See Comments)   Promethazine Other (See Comments)    Other reaction(s): Unknown   Succinylcholine Other (See Comments)    Lock Jaw   Buprenorphine Hcl Hives   Compazine Other (See Comments)    Altered mental status Aggression   Duloxetine     Other reaction(s): Not available   Metoclopramide Other (See Comments)    Dystonia   Morphine And Codeine Hives   Ondansetron Hives and Rash    Other reaction(s): Unknown Other reaction(s): Unknown   Promethazine Hcl Hives   Tegaderm Ag Mesh [Silver] Rash    Old formulation only, is able to tolerate new formulation    Past Medical History:  Diagnosis Date   Addison's disease (HCC)    Adrenal insufficiency (HCC)    Anemia    Anxiety    Aortic stenosis    Aortic stenosis    Appendicitis 12/19/09   Appendicitis    Breast cancer (HCC)    STATUS POST BILATERAL MASTECTOMY. STATUS  POST RECONSTRUCTION. SHE HAD SILICONE BREAST IMPLANTS AND THE  LEFT IMPLANT IS LEAKING SLIGHTLY   Cellulitis of right middle finger 11/07/2018   Cervical cancer (HCC) 12/23/2018   Chest pain    CHF with right heart failure (HCC) 04/17/2017   Chronic respiratory failure with hypoxia (HCC) 12/23/2018   Cough variant asthma 04/13/2019   Depression    GERD (gastroesophageal reflux disease)    takes Dexilant and carafate and gi coctail    Headache    migraines on a daily and monthly regimen    Heart murmur    History of kidney stones    Hodgkin lymphoma (HCC)    STATUS POST MANTLE RADIATION   Hodgkin's lymphoma (HCC)    1987   Hypertension    Hypoxia    Necrotizing fasciitis (HCC) 12/23/2018   Non-ischemic cardiomyopathy (HCC)    Osteoporosis    Palpitations    Pituitary adenoma (HCC) 12/23/2018   Pneumonia    PONV (postoperative nausea and vomiting)    Pre-diabetes    per pt; no meds   Pulmonary hypertension (HCC) 12/23/2018   Raynaud phenomenon    Right heart failure (HCC) 04/17/2017   Seizures (HCC)    last febrile seizure was approx 3 weeks ago per report on 12/01/2020   Sleep apnea    upcoming sleep study per pt    Supplemental oxygen dependent    3 liters   SVT (supraventricular tachycardia)    Tachycardia    Thyroid cancer (HCC)    STATUS POST SURGICAL REMOVAL-CURRENT ON THYROID REPLACEMENT    Family History  Family history unknown: Yes    Social History   Socioeconomic History   Marital status: Married    Spouse name: Not on file   Number of children: Not on file   Years of education: Not on file   Highest education level: Not on file  Occupational History   Not on file  Tobacco Use   Smoking status: Never   Smokeless tobacco: Never  Vaping Use   Vaping status: Never Used  Substance and Sexual Activity   Alcohol use: Not Currently    Comment: social    Drug use: No   Sexual activity: Not Currently    Birth control/protection: Surgical  Other Topics Concern    Not on file  Social History Narrative   Not on file   Social Determinants of Health   Financial Resource Strain: Medium Risk (08/21/2017)   Received from [facility], [facility]   Overall Financial Resource Strain (CARDIA)    Difficulty of Paying Living Expenses: Somewhat hard  Food Insecurity: No Food Insecurity (03/09/2023)   Hunger Vital Sign    Worried About Running Out of Food in the Last Year: Never true    Ran Out of Food in the Last Year: Never true  Transportation Needs: No Transportation Needs (03/09/2023)   PRAPARE - Administrator, Civil Service (Medical): No    Lack of Transportation (Non-Medical): No  Physical Activity: Unknown (08/21/2017)   Received from [facility], [facility]   Exercise Vital Sign    Days of Exercise per Week: 0 days    Minutes of Exercise per Session: Not on file  Stress: Stress Concern Present (08/21/2017)   Received from [facility], [facility]   [employer] of Occupational Health - Occupational Stress Questionnaire    Feeling of Stress : Very much  Social Connections: Unknown (02/12/2022)   [facility] from [facility]  Social Network    Social Network: Not on file  Intimate Partner Violence: Not At Risk (03/09/2023)   Humiliation, Afraid, Rape, and Kick questionnaire    Fear of Current or Ex-Partner: No    Emotionally Abused: No    Physically Abused: No    Sexually Abused: No    Past Medical History, Surgical history, Social history, and Family history were reviewed and updated as appropriate.   Please see review of systems for further details on the patient's review from today.   Objective:   Physical Exam:  LMP  (LMP Unknown)   Physical Exam Constitutional:      General: She is not in acute distress. Musculoskeletal:        General: No deformity.  Neurological:     Mental Status: She is alert and  oriented to person, place, and time.     Coordination: Coordination normal.  Psychiatric:        Attention and Perception: Attention and perception normal. She does not perceive auditory or visual hallucinations.        Mood and Affect: Affect is not labile, blunt, angry or inappropriate.        Speech: Speech normal.        Behavior: Behavior normal.        Thought Content: Thought content normal. Thought content is not paranoid or delusional. Thought content does not include homicidal or suicidal ideation. Thought content does not include homicidal or suicidal plan.        Cognition and Memory: Cognition and memory normal.        Judgment: Judgment normal.     Comments: Insight intact     Lab Review:     Component Value Date/Time   NA 139 03/11/2023 0445   NA 140 08/20/2022 1513   K 4.2 03/11/2023 0445   CL 108 03/11/2023 0445   CO2 24 03/11/2023 0445   GLUCOSE 83 03/11/2023 0445   BUN 19 03/11/2023 0445   BUN 28 (H) 08/20/2022 1513   CREATININE 1.01 (H) 03/11/2023 0445   CREATININE 1.08 (H) 02/09/2022 1202   CALCIUM 8.8 (L) 03/11/2023 0445   PROT 7.3 03/09/2023 1542   PROT 7.1 01/23/2018 1225   ALBUMIN 3.6 03/09/2023 1542   ALBUMIN 4.4 01/23/2018 1225   AST 22 03/09/2023 1542   AST 26 02/09/2022 1202   ALT 36 03/09/2023 1542   ALT 40 02/09/2022 1202   ALKPHOS 55 03/09/2023 1542   BILITOT 0.4 03/09/2023 1542   BILITOT 0.4 02/09/2022 1202   GFRNONAA >60 03/11/2023 0445   GFRNONAA >60 02/09/2022 1202   GFRAA >60 04/27/2020 0613   GFRAA >60 02/10/2020 1020       Component Value Date/Time   WBC 11.9 (H) 03/12/2023 0500   RBC 3.26 (L) 03/12/2023 0500   HGB 7.9 (L) 03/12/2023 0500   HGB 10.6 (L) 08/20/2022 1513   HCT 26.8 (L) 03/12/2023 0500   HCT 34.4 08/20/2022 1513   PLT 332 03/12/2023 0500   PLT 414 08/20/2022 1513   MCV 82.2 03/12/2023 0500   MCV 78 (L) 08/20/2022 1513   MCH 24.2 (L) 03/12/2023 0500   MCHC 29.5 (L) 03/12/2023 0500   RDW 18.9 (H) 03/12/2023  0500   RDW 16.5 (H) 08/20/2022 1513   LYMPHSABS 2.6 03/09/2023 1542   MONOABS 1.1 (H) 03/09/2023 1542   EOSABS 0.1 03/09/2023 1542   BASOSABS 0.1 03/09/2023 1542    No results found for: "POCLITH", "LITHIUM"   No results found  for: "PHENYTOIN", "PHENOBARB", "VALPROATE", "CBMZ"   .res Assessment: Plan:    Plan:  PDMP reviewed  Lexapro 20mg  daily Clonazepam 0.5mg  - 4 times daily. Wellbutrin XL 150mg  every morning - did not tolerate increase in dose - reports seizure history Lamictal 200mg  daily Lamictal 25mg  every morning  Seeing [name] therapy visits.  Discussed potential benefits, risk, and side effects of benzodiazepines to include potential risk of tolerance and dependence, as well as possible drowsiness.  Advised patient not to drive if experiencing drowsiness and to take lowest possible effective dose to minimize risk of dependence and tolerance.   Counseled patient regarding potential benefits, risks, and side effects of Lamictal to include potential risk of Stevens-Johnson syndrome. Advised patient to stop taking Lamictal and contact office immediately if rash develops and to seek urgent medical attention if rash is severe and/or spreading quickly.  RTC 4 weeks  Patient advised to contact office with any questions, adverse effects, or acute worsening in signs and symptoms.  There are no diagnoses linked to this encounter.   Please see After Visit Summary for patient specific instructions.  Future Appointments  Date Time Provider Department Center  04/11/2023  1:00 PM Ezella Kell, Thereasa Solo, NP [facility] None  04/18/2023 10:00 AM Mathis Fare, LCSW [facility] None    No orders of the defined types were placed in this encounter.     -------------------------------

## 2023-04-12 DIAGNOSIS — K219 Gastro-esophageal reflux disease without esophagitis: Secondary | ICD-10-CM | POA: Diagnosis not present

## 2023-04-12 DIAGNOSIS — J45909 Unspecified asthma, uncomplicated: Secondary | ICD-10-CM | POA: Diagnosis not present

## 2023-04-12 DIAGNOSIS — N1831 Chronic kidney disease, stage 3a: Secondary | ICD-10-CM | POA: Diagnosis not present

## 2023-04-12 DIAGNOSIS — E785 Hyperlipidemia, unspecified: Secondary | ICD-10-CM | POA: Diagnosis not present

## 2023-04-12 DIAGNOSIS — R634 Abnormal weight loss: Secondary | ICD-10-CM | POA: Diagnosis not present

## 2023-04-12 DIAGNOSIS — E271 Primary adrenocortical insufficiency: Secondary | ICD-10-CM | POA: Diagnosis not present

## 2023-04-12 DIAGNOSIS — I5030 Unspecified diastolic (congestive) heart failure: Secondary | ICD-10-CM | POA: Diagnosis not present

## 2023-04-12 DIAGNOSIS — J9611 Chronic respiratory failure with hypoxia: Secondary | ICD-10-CM | POA: Diagnosis not present

## 2023-04-12 DIAGNOSIS — R1310 Dysphagia, unspecified: Secondary | ICD-10-CM | POA: Diagnosis not present

## 2023-04-12 DIAGNOSIS — J189 Pneumonia, unspecified organism: Secondary | ICD-10-CM | POA: Diagnosis not present

## 2023-04-12 DIAGNOSIS — K2210 Ulcer of esophagus without bleeding: Secondary | ICD-10-CM | POA: Diagnosis not present

## 2023-04-12 DIAGNOSIS — F32A Depression, unspecified: Secondary | ICD-10-CM | POA: Diagnosis not present

## 2023-04-12 DIAGNOSIS — E890 Postprocedural hypothyroidism: Secondary | ICD-10-CM | POA: Diagnosis not present

## 2023-04-12 DIAGNOSIS — C73 Malignant neoplasm of thyroid gland: Secondary | ICD-10-CM | POA: Diagnosis not present

## 2023-04-13 DIAGNOSIS — R1310 Dysphagia, unspecified: Secondary | ICD-10-CM | POA: Diagnosis not present

## 2023-04-13 DIAGNOSIS — E890 Postprocedural hypothyroidism: Secondary | ICD-10-CM | POA: Diagnosis not present

## 2023-04-13 DIAGNOSIS — I5030 Unspecified diastolic (congestive) heart failure: Secondary | ICD-10-CM | POA: Diagnosis not present

## 2023-04-13 DIAGNOSIS — E271 Primary adrenocortical insufficiency: Secondary | ICD-10-CM | POA: Diagnosis not present

## 2023-04-13 DIAGNOSIS — J45909 Unspecified asthma, uncomplicated: Secondary | ICD-10-CM | POA: Diagnosis not present

## 2023-04-13 DIAGNOSIS — J189 Pneumonia, unspecified organism: Secondary | ICD-10-CM | POA: Diagnosis not present

## 2023-04-13 DIAGNOSIS — F32A Depression, unspecified: Secondary | ICD-10-CM | POA: Diagnosis not present

## 2023-04-13 DIAGNOSIS — E785 Hyperlipidemia, unspecified: Secondary | ICD-10-CM | POA: Diagnosis not present

## 2023-04-13 DIAGNOSIS — N1831 Chronic kidney disease, stage 3a: Secondary | ICD-10-CM | POA: Diagnosis not present

## 2023-04-13 DIAGNOSIS — K2210 Ulcer of esophagus without bleeding: Secondary | ICD-10-CM | POA: Diagnosis not present

## 2023-04-13 DIAGNOSIS — K219 Gastro-esophageal reflux disease without esophagitis: Secondary | ICD-10-CM | POA: Diagnosis not present

## 2023-04-13 DIAGNOSIS — R634 Abnormal weight loss: Secondary | ICD-10-CM | POA: Diagnosis not present

## 2023-04-13 DIAGNOSIS — J9611 Chronic respiratory failure with hypoxia: Secondary | ICD-10-CM | POA: Diagnosis not present

## 2023-04-13 DIAGNOSIS — C73 Malignant neoplasm of thyroid gland: Secondary | ICD-10-CM | POA: Diagnosis not present

## 2023-04-14 DIAGNOSIS — E785 Hyperlipidemia, unspecified: Secondary | ICD-10-CM | POA: Diagnosis not present

## 2023-04-14 DIAGNOSIS — N1831 Chronic kidney disease, stage 3a: Secondary | ICD-10-CM | POA: Diagnosis not present

## 2023-04-14 DIAGNOSIS — F32A Depression, unspecified: Secondary | ICD-10-CM | POA: Diagnosis not present

## 2023-04-14 DIAGNOSIS — I5030 Unspecified diastolic (congestive) heart failure: Secondary | ICD-10-CM | POA: Diagnosis not present

## 2023-04-14 DIAGNOSIS — K219 Gastro-esophageal reflux disease without esophagitis: Secondary | ICD-10-CM | POA: Diagnosis not present

## 2023-04-14 DIAGNOSIS — K2210 Ulcer of esophagus without bleeding: Secondary | ICD-10-CM | POA: Diagnosis not present

## 2023-04-14 DIAGNOSIS — R634 Abnormal weight loss: Secondary | ICD-10-CM | POA: Diagnosis not present

## 2023-04-14 DIAGNOSIS — J9611 Chronic respiratory failure with hypoxia: Secondary | ICD-10-CM | POA: Diagnosis not present

## 2023-04-14 DIAGNOSIS — J189 Pneumonia, unspecified organism: Secondary | ICD-10-CM | POA: Diagnosis not present

## 2023-04-14 DIAGNOSIS — R1310 Dysphagia, unspecified: Secondary | ICD-10-CM | POA: Diagnosis not present

## 2023-04-14 DIAGNOSIS — J45909 Unspecified asthma, uncomplicated: Secondary | ICD-10-CM | POA: Diagnosis not present

## 2023-04-14 DIAGNOSIS — E271 Primary adrenocortical insufficiency: Secondary | ICD-10-CM | POA: Diagnosis not present

## 2023-04-14 DIAGNOSIS — C73 Malignant neoplasm of thyroid gland: Secondary | ICD-10-CM | POA: Diagnosis not present

## 2023-04-14 DIAGNOSIS — E890 Postprocedural hypothyroidism: Secondary | ICD-10-CM | POA: Diagnosis not present

## 2023-04-15 ENCOUNTER — Encounter (HOSPITAL_COMMUNITY): Payer: Self-pay

## 2023-04-15 DIAGNOSIS — N1831 Chronic kidney disease, stage 3a: Secondary | ICD-10-CM | POA: Diagnosis not present

## 2023-04-15 DIAGNOSIS — J45909 Unspecified asthma, uncomplicated: Secondary | ICD-10-CM | POA: Diagnosis not present

## 2023-04-15 DIAGNOSIS — J9611 Chronic respiratory failure with hypoxia: Secondary | ICD-10-CM | POA: Diagnosis not present

## 2023-04-15 DIAGNOSIS — E785 Hyperlipidemia, unspecified: Secondary | ICD-10-CM | POA: Diagnosis not present

## 2023-04-15 DIAGNOSIS — K2210 Ulcer of esophagus without bleeding: Secondary | ICD-10-CM | POA: Diagnosis not present

## 2023-04-15 DIAGNOSIS — R1310 Dysphagia, unspecified: Secondary | ICD-10-CM | POA: Diagnosis not present

## 2023-04-15 DIAGNOSIS — C73 Malignant neoplasm of thyroid gland: Secondary | ICD-10-CM | POA: Diagnosis not present

## 2023-04-15 DIAGNOSIS — F32A Depression, unspecified: Secondary | ICD-10-CM | POA: Diagnosis not present

## 2023-04-15 DIAGNOSIS — K219 Gastro-esophageal reflux disease without esophagitis: Secondary | ICD-10-CM | POA: Diagnosis not present

## 2023-04-15 DIAGNOSIS — E890 Postprocedural hypothyroidism: Secondary | ICD-10-CM | POA: Diagnosis not present

## 2023-04-15 DIAGNOSIS — I5030 Unspecified diastolic (congestive) heart failure: Secondary | ICD-10-CM | POA: Diagnosis not present

## 2023-04-15 DIAGNOSIS — E271 Primary adrenocortical insufficiency: Secondary | ICD-10-CM | POA: Diagnosis not present

## 2023-04-15 DIAGNOSIS — R634 Abnormal weight loss: Secondary | ICD-10-CM | POA: Diagnosis not present

## 2023-04-15 DIAGNOSIS — J189 Pneumonia, unspecified organism: Secondary | ICD-10-CM | POA: Diagnosis not present

## 2023-04-16 DIAGNOSIS — I5030 Unspecified diastolic (congestive) heart failure: Secondary | ICD-10-CM | POA: Diagnosis not present

## 2023-04-16 DIAGNOSIS — K2210 Ulcer of esophagus without bleeding: Secondary | ICD-10-CM | POA: Diagnosis not present

## 2023-04-16 DIAGNOSIS — C73 Malignant neoplasm of thyroid gland: Secondary | ICD-10-CM | POA: Diagnosis not present

## 2023-04-16 DIAGNOSIS — R1310 Dysphagia, unspecified: Secondary | ICD-10-CM | POA: Diagnosis not present

## 2023-04-16 DIAGNOSIS — E271 Primary adrenocortical insufficiency: Secondary | ICD-10-CM | POA: Diagnosis not present

## 2023-04-16 DIAGNOSIS — F32A Depression, unspecified: Secondary | ICD-10-CM | POA: Diagnosis not present

## 2023-04-16 DIAGNOSIS — K219 Gastro-esophageal reflux disease without esophagitis: Secondary | ICD-10-CM | POA: Diagnosis not present

## 2023-04-16 DIAGNOSIS — J45909 Unspecified asthma, uncomplicated: Secondary | ICD-10-CM | POA: Diagnosis not present

## 2023-04-16 DIAGNOSIS — R634 Abnormal weight loss: Secondary | ICD-10-CM | POA: Diagnosis not present

## 2023-04-16 DIAGNOSIS — E785 Hyperlipidemia, unspecified: Secondary | ICD-10-CM | POA: Diagnosis not present

## 2023-04-16 DIAGNOSIS — E890 Postprocedural hypothyroidism: Secondary | ICD-10-CM | POA: Diagnosis not present

## 2023-04-16 DIAGNOSIS — J9611 Chronic respiratory failure with hypoxia: Secondary | ICD-10-CM | POA: Diagnosis not present

## 2023-04-16 DIAGNOSIS — J189 Pneumonia, unspecified organism: Secondary | ICD-10-CM | POA: Diagnosis not present

## 2023-04-16 DIAGNOSIS — N1831 Chronic kidney disease, stage 3a: Secondary | ICD-10-CM | POA: Diagnosis not present

## 2023-04-17 DIAGNOSIS — K2210 Ulcer of esophagus without bleeding: Secondary | ICD-10-CM | POA: Diagnosis not present

## 2023-04-17 DIAGNOSIS — R634 Abnormal weight loss: Secondary | ICD-10-CM | POA: Diagnosis not present

## 2023-04-17 DIAGNOSIS — E271 Primary adrenocortical insufficiency: Secondary | ICD-10-CM | POA: Diagnosis not present

## 2023-04-17 DIAGNOSIS — E890 Postprocedural hypothyroidism: Secondary | ICD-10-CM | POA: Diagnosis not present

## 2023-04-17 DIAGNOSIS — I5030 Unspecified diastolic (congestive) heart failure: Secondary | ICD-10-CM | POA: Diagnosis not present

## 2023-04-17 DIAGNOSIS — N1831 Chronic kidney disease, stage 3a: Secondary | ICD-10-CM | POA: Diagnosis not present

## 2023-04-17 DIAGNOSIS — K219 Gastro-esophageal reflux disease without esophagitis: Secondary | ICD-10-CM | POA: Diagnosis not present

## 2023-04-17 DIAGNOSIS — E785 Hyperlipidemia, unspecified: Secondary | ICD-10-CM | POA: Diagnosis not present

## 2023-04-17 DIAGNOSIS — J189 Pneumonia, unspecified organism: Secondary | ICD-10-CM | POA: Diagnosis not present

## 2023-04-17 DIAGNOSIS — J45909 Unspecified asthma, uncomplicated: Secondary | ICD-10-CM | POA: Diagnosis not present

## 2023-04-17 DIAGNOSIS — F32A Depression, unspecified: Secondary | ICD-10-CM | POA: Diagnosis not present

## 2023-04-17 DIAGNOSIS — C73 Malignant neoplasm of thyroid gland: Secondary | ICD-10-CM | POA: Diagnosis not present

## 2023-04-17 DIAGNOSIS — J9611 Chronic respiratory failure with hypoxia: Secondary | ICD-10-CM | POA: Diagnosis not present

## 2023-04-17 DIAGNOSIS — R1310 Dysphagia, unspecified: Secondary | ICD-10-CM | POA: Diagnosis not present

## 2023-04-18 ENCOUNTER — Other Ambulatory Visit (HOSPITAL_COMMUNITY): Payer: Self-pay

## 2023-04-18 ENCOUNTER — Ambulatory Visit (INDEPENDENT_AMBULATORY_CARE_PROVIDER_SITE_OTHER): Payer: BC Managed Care – PPO | Admitting: Psychiatry

## 2023-04-18 DIAGNOSIS — F331 Major depressive disorder, recurrent, moderate: Secondary | ICD-10-CM | POA: Diagnosis not present

## 2023-04-18 NOTE — Progress Notes (Signed)
[facility] Counselor/Therapist Progress Note  Patient ID: BENNETT VANSCYOC, MRN: [id number],    Date: 04/18/2023  Time Spent: 50 minutes   Treatment Type: Individual Therapy  Virtual Visit via Telehealth Note: MyChart Video session Connected with patient by a telemedicine/telehealth application, with their informed consent, and verified patient privacy and that I am speaking with the correct person using two identifiers. I discussed the limitations, risks, security and privacy concerns of performing psychotherapy and the availability of in person appointments. I also discussed with the patient that there may be a patient responsible charge related to this service. The patient expressed understanding and agreed to proceed. I discussed the treatment planning with the patient. The patient was provided an opportunity to ask questions and all were answered. The patient agreed with the plan and demonstrated an understanding of the instructions. The patient was advised to call  our office if  symptoms worsen or feel they are in a crisis state and need immediate contact.   Therapist Location: office Patient Location: home   Reported Symptoms:  depression, anger  Mental Status Exam:  Appearance:   Casual     Behavior:  Appropriate, Sharing, and Motivated  Motor:  Is palliative care patient and currently in bed  Speech/Language:   Clear and Coherent  Affect:  Depressed and Tearful  Mood:  angry, anxious, and depressed  Thought process:  goal directed  Thought content:    Rumination  Sensory/Perceptual disturbances:    WNL  Orientation:  oriented to person, place, time/date, situation, day of week, month of year, year, and stated date of April 18, 2023  Attention:  Good  Concentration:  Good and Fair  Memory:  WNL  Fund of knowledge:   Good  Insight:    Good and Fair  Judgment:   Good and Fair  Impulse Control:  Good   Risk Assessment: Danger to Self:  No Self-injurious Behavior:  No Danger to Others: No Duty to Warn:no Physical Aggression / Violence:No  Access to Firearms a concern: No  Gang Involvement:No   Subjective: Patient today angry and talking/venting her anger and frustration with hospice and palliative care, feeling they don't care and have rejected her. Focused totally on listening initially and let her vent to a point where she eventually was calmer and able to talk through her anger, resentment, and frustrations.  We were able to also look at how some of her anger might be misplaced onto hospice or palliative care.  Encouraged patient to be open with palliative care when they follow through on their commitment to reach out to her this week.  Patient calmer by end of session and was concerned that palliative care had not made contact with her within the timeframe that she states she was promised on Monday of this week.  Patient added that her husband was following up with palliative care just to check on when someone from there might be visiting patient.  Next appointment within 2 weeks.  Interventions: Cognitive Behavioral Therapy  Long-term goal:  Patient will confront her anxiety, fears, anger reported re: her illness and use time in therapy and beyond to work on this, while receiving feedback and support from therapist. Help her determine what she feels is realistic for her considering her health challenges and difficult prognosis.  Short-term goal: Participate in therapy and initiate communication of needs and desires at this point in her struggle physically.  Strategies: Share and talk through anxiety, anger, and depression that  are experienced and work to find and use helpful coping tools to better manage those feelings.  Diagnosis:   ICD-10-CM   1. Major depressive disorder, recurrent episode, moderate (HCC)  F33.1      Plan: Patient today very angry initially and stating that nobody cared about her.  Was very angry with hospice and palliative care.   Allowed her to vent freely until she felt exhausted and that seemed to be helpful to her.  Husband is following up with palliative care as patient feels they have not contacted her within the timeframe that she feels was promised to her.  Was much calmer by end of session.  Progress has been made on her goals and she needs to continue her work with goal-directed behaviors in order to move in a forward direction.  Goal review and progress/challenges noted with patient.  Next appointment within 2 to 3 weeks.   Mathis Fare, LCSW

## 2023-04-19 ENCOUNTER — Other Ambulatory Visit (HOSPITAL_COMMUNITY): Payer: Self-pay

## 2023-04-22 ENCOUNTER — Encounter (HOSPITAL_COMMUNITY): Payer: Self-pay

## 2023-04-22 ENCOUNTER — Other Ambulatory Visit: Payer: Self-pay

## 2023-04-22 ENCOUNTER — Emergency Department (HOSPITAL_COMMUNITY): Payer: BC Managed Care – PPO

## 2023-04-22 ENCOUNTER — Inpatient Hospital Stay (HOSPITAL_COMMUNITY)
Admission: EM | Admit: 2023-04-22 | Discharge: 2023-04-25 | DRG: 315 | Disposition: A | Discharge status: Home / Self Care | Payer: BC Managed Care – PPO | Attending: Internal Medicine | Admitting: Internal Medicine

## 2023-04-22 DIAGNOSIS — E271 Primary adrenocortical insufficiency: Secondary | ICD-10-CM | POA: Diagnosis present

## 2023-04-22 DIAGNOSIS — Z923 Personal history of irradiation: Secondary | ICD-10-CM

## 2023-04-22 DIAGNOSIS — J449 Chronic obstructive pulmonary disease, unspecified: Secondary | ICD-10-CM | POA: Diagnosis present

## 2023-04-22 DIAGNOSIS — E860 Dehydration: Secondary | ICD-10-CM | POA: Diagnosis present

## 2023-04-22 DIAGNOSIS — Z9981 Dependence on supplemental oxygen: Secondary | ICD-10-CM

## 2023-04-22 DIAGNOSIS — E1122 Type 2 diabetes mellitus with diabetic chronic kidney disease: Secondary | ICD-10-CM | POA: Diagnosis present

## 2023-04-22 DIAGNOSIS — Z8701 Personal history of pneumonia (recurrent): Secondary | ICD-10-CM

## 2023-04-22 DIAGNOSIS — E785 Hyperlipidemia, unspecified: Secondary | ICD-10-CM | POA: Diagnosis present

## 2023-04-22 DIAGNOSIS — E272 Addisonian crisis: Secondary | ICD-10-CM | POA: Diagnosis not present

## 2023-04-22 DIAGNOSIS — I5032 Chronic diastolic (congestive) heart failure: Secondary | ICD-10-CM | POA: Diagnosis present

## 2023-04-22 DIAGNOSIS — Z7951 Long term (current) use of inhaled steroids: Secondary | ICD-10-CM

## 2023-04-22 DIAGNOSIS — Z85820 Personal history of malignant melanoma of skin: Secondary | ICD-10-CM

## 2023-04-22 DIAGNOSIS — I428 Other cardiomyopathies: Secondary | ICD-10-CM | POA: Diagnosis present

## 2023-04-22 DIAGNOSIS — F32A Depression, unspecified: Secondary | ICD-10-CM | POA: Diagnosis present

## 2023-04-22 DIAGNOSIS — I130 Hypertensive heart and chronic kidney disease with heart failure and stage 1 through stage 4 chronic kidney disease, or unspecified chronic kidney disease: Secondary | ICD-10-CM | POA: Diagnosis not present

## 2023-04-22 DIAGNOSIS — E890 Postprocedural hypothyroidism: Secondary | ICD-10-CM | POA: Diagnosis present

## 2023-04-22 DIAGNOSIS — N1831 Chronic kidney disease, stage 3a: Secondary | ICD-10-CM | POA: Diagnosis not present

## 2023-04-22 DIAGNOSIS — R918 Other nonspecific abnormal finding of lung field: Secondary | ICD-10-CM | POA: Diagnosis not present

## 2023-04-22 DIAGNOSIS — I5082 Biventricular heart failure: Secondary | ICD-10-CM | POA: Diagnosis present

## 2023-04-22 DIAGNOSIS — Z452 Encounter for adjustment and management of vascular access device: Secondary | ICD-10-CM | POA: Diagnosis not present

## 2023-04-22 DIAGNOSIS — Z79899 Other long term (current) drug therapy: Secondary | ICD-10-CM

## 2023-04-22 DIAGNOSIS — I2729 Other secondary pulmonary hypertension: Secondary | ICD-10-CM | POA: Diagnosis present

## 2023-04-22 DIAGNOSIS — D72829 Elevated white blood cell count, unspecified: Secondary | ICD-10-CM

## 2023-04-22 DIAGNOSIS — F419 Anxiety disorder, unspecified: Secondary | ICD-10-CM | POA: Diagnosis present

## 2023-04-22 DIAGNOSIS — J9611 Chronic respiratory failure with hypoxia: Secondary | ICD-10-CM | POA: Diagnosis present

## 2023-04-22 DIAGNOSIS — N179 Acute kidney failure, unspecified: Secondary | ICD-10-CM | POA: Diagnosis not present

## 2023-04-22 DIAGNOSIS — R112 Nausea with vomiting, unspecified: Principal | ICD-10-CM

## 2023-04-22 DIAGNOSIS — A419 Sepsis, unspecified organism: Principal | ICD-10-CM | POA: Diagnosis present

## 2023-04-22 DIAGNOSIS — Z7984 Long term (current) use of oral hypoglycemic drugs: Secondary | ICD-10-CM

## 2023-04-22 DIAGNOSIS — Z79890 Hormone replacement therapy (postmenopausal): Secondary | ICD-10-CM

## 2023-04-22 DIAGNOSIS — Z515 Encounter for palliative care: Secondary | ICD-10-CM | POA: Diagnosis not present

## 2023-04-22 DIAGNOSIS — J8283 Eosinophilic asthma: Secondary | ICD-10-CM | POA: Diagnosis present

## 2023-04-22 DIAGNOSIS — Y848 Other medical procedures as the cause of abnormal reaction of the patient, or of later complication, without mention of misadventure at the time of the procedure: Secondary | ICD-10-CM | POA: Diagnosis present

## 2023-04-22 DIAGNOSIS — Z7982 Long term (current) use of aspirin: Secondary | ICD-10-CM

## 2023-04-22 DIAGNOSIS — T80219A Unspecified infection due to central venous catheter, initial encounter: Principal | ICD-10-CM | POA: Diagnosis present

## 2023-04-22 DIAGNOSIS — Z9013 Acquired absence of bilateral breasts and nipples: Secondary | ICD-10-CM

## 2023-04-22 DIAGNOSIS — R531 Weakness: Secondary | ICD-10-CM | POA: Diagnosis not present

## 2023-04-22 DIAGNOSIS — E871 Hypo-osmolality and hyponatremia: Secondary | ICD-10-CM | POA: Diagnosis not present

## 2023-04-22 DIAGNOSIS — Z9221 Personal history of antineoplastic chemotherapy: Secondary | ICD-10-CM

## 2023-04-22 DIAGNOSIS — Z1152 Encounter for screening for COVID-19: Secondary | ICD-10-CM

## 2023-04-22 DIAGNOSIS — R7881 Bacteremia: Secondary | ICD-10-CM | POA: Diagnosis present

## 2023-04-22 DIAGNOSIS — Z8571 Personal history of Hodgkin lymphoma: Secondary | ICD-10-CM

## 2023-04-22 DIAGNOSIS — Z8541 Personal history of malignant neoplasm of cervix uteri: Secondary | ICD-10-CM

## 2023-04-22 DIAGNOSIS — G8929 Other chronic pain: Secondary | ICD-10-CM | POA: Diagnosis present

## 2023-04-22 DIAGNOSIS — Z85850 Personal history of malignant neoplasm of thyroid: Secondary | ICD-10-CM

## 2023-04-22 DIAGNOSIS — I38 Endocarditis, valve unspecified: Secondary | ICD-10-CM | POA: Diagnosis not present

## 2023-04-22 DIAGNOSIS — J189 Pneumonia, unspecified organism: Secondary | ICD-10-CM | POA: Diagnosis not present

## 2023-04-22 DIAGNOSIS — G4730 Sleep apnea, unspecified: Secondary | ICD-10-CM | POA: Diagnosis present

## 2023-04-22 DIAGNOSIS — K219 Gastro-esophageal reflux disease without esophagitis: Secondary | ICD-10-CM | POA: Diagnosis present

## 2023-04-22 DIAGNOSIS — Z888 Allergy status to other drugs, medicaments and biological substances status: Secondary | ICD-10-CM

## 2023-04-22 DIAGNOSIS — E876 Hypokalemia: Secondary | ICD-10-CM | POA: Diagnosis present

## 2023-04-22 DIAGNOSIS — Z853 Personal history of malignant neoplasm of breast: Secondary | ICD-10-CM

## 2023-04-22 DIAGNOSIS — M810 Age-related osteoporosis without current pathological fracture: Secondary | ICD-10-CM | POA: Diagnosis present

## 2023-04-22 DIAGNOSIS — Z66 Do not resuscitate: Secondary | ICD-10-CM | POA: Diagnosis present

## 2023-04-22 DIAGNOSIS — D631 Anemia in chronic kidney disease: Secondary | ICD-10-CM | POA: Diagnosis not present

## 2023-04-22 DIAGNOSIS — I10 Essential (primary) hypertension: Secondary | ICD-10-CM | POA: Diagnosis not present

## 2023-04-22 DIAGNOSIS — B9689 Other specified bacterial agents as the cause of diseases classified elsewhere: Secondary | ICD-10-CM | POA: Diagnosis present

## 2023-04-22 DIAGNOSIS — Z885 Allergy status to narcotic agent status: Secondary | ICD-10-CM

## 2023-04-22 DIAGNOSIS — I7300 Raynaud's syndrome without gangrene: Secondary | ICD-10-CM | POA: Diagnosis present

## 2023-04-22 DIAGNOSIS — Z9102 Food additives allergy status: Secondary | ICD-10-CM

## 2023-04-22 DIAGNOSIS — D649 Anemia, unspecified: Secondary | ICD-10-CM | POA: Insufficient documentation

## 2023-04-22 LAB — CBC WITH DIFFERENTIAL/PLATELET
Abs Immature Granulocytes: 0.13 10*3/uL — ABNORMAL HIGH (ref 0.00–0.07)
Basophils Absolute: 0.1 10*3/uL (ref 0.0–0.1)
Basophils Relative: 0 %
Eosinophils Absolute: 0 10*3/uL (ref 0.0–0.5)
Eosinophils Relative: 0 %
HCT: 31.6 % — ABNORMAL LOW (ref 36.0–46.0)
Hemoglobin: 9.6 g/dL — ABNORMAL LOW (ref 12.0–15.0)
Immature Granulocytes: 1 %
Lymphocytes Relative: 9 %
Lymphs Abs: 2.2 10*3/uL (ref 0.7–4.0)
MCH: 24.2 pg — ABNORMAL LOW (ref 26.0–34.0)
MCHC: 30.4 g/dL (ref 30.0–36.0)
MCV: 79.8 fL — ABNORMAL LOW (ref 80.0–100.0)
Monocytes Absolute: 1 10*3/uL (ref 0.1–1.0)
Monocytes Relative: 4 %
Neutro Abs: 19.7 10*3/uL — ABNORMAL HIGH (ref 1.7–7.7)
Neutrophils Relative %: 86 %
Platelets: 447 10*3/uL — ABNORMAL HIGH (ref 150–400)
RBC: 3.96 MIL/uL (ref 3.87–5.11)
RDW: 17.1 % — ABNORMAL HIGH (ref 11.5–15.5)
WBC: 23.1 10*3/uL — ABNORMAL HIGH (ref 4.0–10.5)
nRBC: 0 % (ref 0.0–0.2)

## 2023-04-22 LAB — RESPIRATORY PANEL BY PCR

## 2023-04-22 LAB — COMPREHENSIVE METABOLIC PANEL
ALT: 32 U/L (ref 0–44)
AST: 22 U/L (ref 15–41)
Albumin: 3.9 g/dL (ref 3.5–5.0)
Alkaline Phosphatase: 63 U/L (ref 38–126)
Anion gap: 12 (ref 5–15)
BUN: 33 mg/dL — ABNORMAL HIGH (ref 6–20)
CO2: 22 mmol/L (ref 22–32)
Calcium: 8.8 mg/dL — ABNORMAL LOW (ref 8.9–10.3)
Chloride: 99 mmol/L (ref 98–111)
Creatinine, Ser: 1.62 mg/dL — ABNORMAL HIGH (ref 0.44–1.00)
GFR, Estimated: 38 mL/min — ABNORMAL LOW (ref >60)
Glucose, Bld: 169 mg/dL — ABNORMAL HIGH (ref 70–99)
Potassium: 3.8 mmol/L (ref 3.5–5.1)
Sodium: 133 mmol/L — ABNORMAL LOW (ref 135–145)
Total Bilirubin: 0.5 mg/dL (ref 0.3–1.2)
Total Protein: 7.9 g/dL (ref 6.5–8.1)

## 2023-04-22 LAB — URINALYSIS, W/ REFLEX TO CULTURE (INFECTION SUSPECTED)
Bacteria, UA: NONE SEEN
Bilirubin Urine: NEGATIVE
Glucose, UA: NEGATIVE mg/dL
Hgb urine dipstick: NEGATIVE
Ketones, ur: NEGATIVE mg/dL
Leukocytes,Ua: NEGATIVE
Nitrite: NEGATIVE
Protein, ur: NEGATIVE mg/dL
Specific Gravity, Urine: 1.014 (ref 1.005–1.030)
pH: 5 (ref 5.0–8.0)

## 2023-04-22 LAB — SARS CORONAVIRUS 2 BY RT PCR: SARS Coronavirus 2 by RT PCR: NEGATIVE

## 2023-04-22 LAB — LIPASE, BLOOD: Lipase: 36 U/L (ref 11–51)

## 2023-04-22 LAB — BRAIN NATRIURETIC PEPTIDE: B Natriuretic Peptide: 34.5 pg/mL (ref 0.0–100.0)

## 2023-04-22 LAB — LACTIC ACID, PLASMA: Lactic Acid, Venous: 1.7 mmol/L (ref 0.5–1.9)

## 2023-04-22 MED ORDER — LAMOTRIGINE 100 MG PO TABS
200.0000 mg | ORAL_TABLET | Freq: Every day | ORAL | Status: DC
  Administered 2023-04-23 – 2023-04-24 (×3): 200 mg via ORAL
  Filled 2023-04-22 (×3): qty 2

## 2023-04-22 MED ORDER — SODIUM CHLORIDE 0.9 % IV SOLN
2.0000 g | Freq: Two times a day (BID) | INTRAVENOUS | Status: DC
  Administered 2023-04-23 (×2): 2 g via INTRAVENOUS
  Filled 2023-04-22 (×2): qty 12.5

## 2023-04-22 MED ORDER — VANCOMYCIN HCL 1750 MG/350ML IV SOLN
1750.0000 mg | INTRAVENOUS | Status: AC
  Administered 2023-04-22: 1750 mg via INTRAVENOUS
  Filled 2023-04-22: qty 350

## 2023-04-22 MED ORDER — FAMOTIDINE 40 MG/5ML PO SUSR
20.0000 mg | Freq: Every day | ORAL | Status: DC
  Administered 2023-04-23 – 2023-04-25 (×3): 20 mg via ORAL
  Filled 2023-04-22 (×3): qty 2.5

## 2023-04-22 MED ORDER — TOPIRAMATE 25 MG PO TABS
150.0000 mg | ORAL_TABLET | Freq: Every day | ORAL | Status: DC
  Administered 2023-04-23 – 2023-04-24 (×3): 150 mg via ORAL
  Filled 2023-04-22 (×3): qty 2

## 2023-04-22 MED ORDER — INSULIN ASPART 100 UNIT/ML IJ SOLN
0.0000 [IU] | Freq: Every day | INTRAMUSCULAR | Status: DC
  Filled 2023-04-22: qty 0.05

## 2023-04-22 MED ORDER — PANTOPRAZOLE SODIUM 40 MG PO TBEC
40.0000 mg | DELAYED_RELEASE_TABLET | Freq: Every day | ORAL | Status: DC
  Administered 2023-04-23 – 2023-04-25 (×3): 40 mg via ORAL
  Filled 2023-04-22 (×3): qty 1

## 2023-04-22 MED ORDER — LORAZEPAM 0.5 MG PO TABS
0.5000 mg | ORAL_TABLET | Freq: Three times a day (TID) | ORAL | Status: DC | PRN
  Administered 2023-04-23: 0.5 mg via ORAL
  Filled 2023-04-22 (×2): qty 1

## 2023-04-22 MED ORDER — ALBUTEROL SULFATE (2.5 MG/3ML) 0.083% IN NEBU: 2.5000 mg | INHALATION_SOLUTION | Freq: Four times a day (QID) | RESPIRATORY_TRACT | Status: DC | PRN

## 2023-04-22 MED ORDER — SODIUM CHLORIDE 0.9 % IV SOLN: INTRAVENOUS | Status: AC

## 2023-04-22 MED ORDER — DIPHENHYDRAMINE HCL 12.5 MG/5ML PO ELIX: 25.0000 mg | ORAL_SOLUTION | Freq: Four times a day (QID) | ORAL | Status: DC | PRN

## 2023-04-22 MED ORDER — HYDROCORTISONE SOD SUC (PF) 100 MG IJ SOLR
100.0000 mg | Freq: Once | INTRAMUSCULAR | Status: AC
  Administered 2023-04-22: 100 mg via INTRAVENOUS
  Filled 2023-04-22: qty 2

## 2023-04-22 MED ORDER — UMECLIDINIUM BROMIDE 62.5 MCG/ACT IN AEPB
1.0000 | INHALATION_SPRAY | Freq: Every day | RESPIRATORY_TRACT | Status: DC
  Administered 2023-04-24 – 2023-04-25 (×2): 1 via RESPIRATORY_TRACT
  Filled 2023-04-22: qty 7

## 2023-04-22 MED ORDER — LAMOTRIGINE 25 MG PO TABS
25.0000 mg | ORAL_TABLET | Freq: Every morning | ORAL | Status: DC
  Administered 2023-04-23 – 2023-04-25 (×3): 25 mg via ORAL
  Filled 2023-04-22 (×3): qty 1

## 2023-04-22 MED ORDER — SODIUM CHLORIDE 0.9 % IV BOLUS
500.0000 mL | Freq: Once | INTRAVENOUS | Status: AC
  Administered 2023-04-22: 500 mL via INTRAVENOUS

## 2023-04-22 MED ORDER — LEVOTHYROXINE SODIUM 100 MCG PO TABS
100.0000 ug | ORAL_TABLET | Freq: Every day | ORAL | Status: DC
  Administered 2023-04-23 – 2023-04-25 (×3): 100 ug via ORAL
  Filled 2023-04-22 (×3): qty 1

## 2023-04-22 MED ORDER — SODIUM CHLORIDE 0.9 % IV SOLN
2.0000 g | INTRAVENOUS | Status: AC
  Administered 2023-04-22: 2 g via INTRAVENOUS

## 2023-04-22 MED ORDER — ENOXAPARIN SODIUM 40 MG/0.4ML IJ SOSY
40.0000 mg | PREFILLED_SYRINGE | INTRAMUSCULAR | Status: DC
  Administered 2023-04-23 – 2023-04-25 (×3): 40 mg via SUBCUTANEOUS
  Filled 2023-04-22 (×3): qty 0.4

## 2023-04-22 MED ORDER — ACETAMINOPHEN 325 MG PO TABS: 650.0000 mg | ORAL_TABLET | Freq: Four times a day (QID) | ORAL | Status: DC | PRN

## 2023-04-22 MED ORDER — SUCRALFATE 1 GM/10ML PO SUSP
1.0000 g | Freq: Every day | ORAL | Status: DC | PRN
  Administered 2023-04-23: 1 g via ORAL
  Filled 2023-04-22: qty 10

## 2023-04-22 MED ORDER — BUPROPION HCL ER (XL) 150 MG PO TB24
150.0000 mg | ORAL_TABLET | Freq: Every day | ORAL | Status: DC
  Administered 2023-04-23 – 2023-04-25 (×3): 150 mg via ORAL
  Filled 2023-04-22 (×3): qty 1

## 2023-04-22 MED ORDER — ESCITALOPRAM OXALATE 20 MG PO TABS
20.0000 mg | ORAL_TABLET | Freq: Every day | ORAL | Status: DC
  Administered 2023-04-23 – 2023-04-25 (×3): 20 mg via ORAL
  Filled 2023-04-22: qty 2
  Filled 2023-04-22 (×2): qty 1

## 2023-04-22 MED ORDER — TIOTROPIUM BROMIDE MONOHYDRATE 1.25 MCG/ACT IN AERS: 2.0000 | INHALATION_SPRAY | Freq: Every day | RESPIRATORY_TRACT | Status: DC

## 2023-04-22 MED ORDER — HYDROCORTISONE SOD SUC (PF) 100 MG IJ SOLR
50.0000 mg | Freq: Four times a day (QID) | INTRAMUSCULAR | Status: DC
  Administered 2023-04-23: 50 mg via INTRAVENOUS
  Filled 2023-04-22 (×2): qty 1

## 2023-04-22 MED ORDER — INSULIN ASPART 100 UNIT/ML IJ SOLN
0.0000 [IU] | Freq: Three times a day (TID) | INTRAMUSCULAR | Status: DC
  Administered 2023-04-24: 1 [IU] via SUBCUTANEOUS
  Filled 2023-04-22: qty 0.09

## 2023-04-22 MED ORDER — VANCOMYCIN HCL 1500 MG/300ML IV SOLN
1500.0000 mg | INTRAVENOUS | Status: DC
  Filled 2023-04-22: qty 300

## 2023-04-22 MED ORDER — ROSUVASTATIN CALCIUM 10 MG PO TABS
10.0000 mg | ORAL_TABLET | Freq: Every day | ORAL | Status: DC
  Administered 2023-04-23 – 2023-04-25 (×3): 10 mg via ORAL
  Filled 2023-04-22 (×4): qty 1

## 2023-04-22 MED ORDER — ACETAMINOPHEN 650 MG RE SUPP: 650.0000 mg | Freq: Four times a day (QID) | RECTAL | Status: DC | PRN

## 2023-04-22 NOTE — ED Triage Notes (Addendum)
Pt arrived via POV, c/o vomiting and weakness, feels she is starting an adrenal crisis. On 5L baseline. States fevers this morning.

## 2023-04-22 NOTE — Progress Notes (Signed)
A consult was received from an ED physician for Vancomycin and Cefepime per pharmacy dosing.  The patient's profile has been reviewed for ht/wt/allergies/indication/available labs.    A one time order has been placed for Vancomycin 1750mg  IV and Cefepime 2g IV.  Further antibiotics/pharmacy consults should be ordered by admitting physician if indicated.                       Thank you, Jamse Mead 04/22/2023  8:55 PM

## 2023-04-22 NOTE — ED Provider Notes (Signed)
[place] [facility]ERGENC[facility] DEPARTMENT AT [facility] Provider Note   CSN: [id number] Arrival date & time: 04/22/23  1557     History  Chief Complaint  Patient presents with   Emesis    [name] is a 52 y.o. female.   Emesis Patient with history of head and disease.  Has had vomiting and weakness.  Started somewhat yesterday.  States fever started this morning.  Temperature only up to 100.9.  States she had episodes like this in the past.  States they thought maybe infection was coming from the line but is recently had a change.  Has recently been on hospice but is still on palliative care.  Think she is also an addisonian crisis.  Took an extra 20 mg of hydrocortisone earlier today.  Feels as if she is dehydrated.  Does have a history of CHF but states she has not really been able to eat and drink.  Does feel as if there is also some pill stuck in her throat again.    Past Medical History:  Diagnosis Date   Addison's disease (HCC)    Adrenal insufficiency (HCC)    Anemia    Anxiety    Aortic stenosis    Aortic stenosis    Appendicitis 12/19/09   Appendicitis    Breast cancer (HCC)    STATUS POST BILATERAL MASTECTOMY. STATUS POST RECONSTRUCTION. SHE HAD SILICONE BREAST IMPLANTS AND THE LEFT IMPLANT IS LEAKING SLIGHTLY   Cellulitis of right middle finger 11/07/2018   Cervical cancer (HCC) 12/23/2018   Chest pain    CHF with right heart failure (HCC) 04/17/2017   Chronic respiratory failure with hypoxia (HCC) 12/23/2018   Cough variant asthma 04/13/2019   Depression    GERD (gastroesophageal reflux disease)    takes Dexilant and carafate and gi coctail    Headache    migraines on a daily and monthly regimen    Heart murmur    History of kidney stones    Hodgkin lymphoma (HCC)    STATUS POST MANTLE RADIATION   Hodgkin's lymphoma (HCC)    1987   Hypertension    Hypoxia    Necrotizing fasciitis (HCC) 12/23/2018   Non-ischemic cardiomyopathy (HCC)    Osteoporosis     Palpitations    Pituitary adenoma (HCC) 12/23/2018   Pneumonia    PONV (postoperative nausea and vomiting)    Pre-diabetes    per pt; no meds   Pulmonary hypertension (HCC) 12/23/2018   Raynaud phenomenon    Right heart failure (HCC) 04/17/2017   Seizures (HCC)    last febrile seizure was approx 3 weeks ago per report on 12/01/2020   Sleep apnea    upcoming sleep study per pt    Supplemental oxygen dependent    3 liters   SVT (supraventricular tachycardia)    Tachycardia    Thyroid cancer (HCC)    STATUS POST SURGICAL REMOVAL-CURRENT ON THYROID REPLACEMENT    Home Medications Prior to Admission medications   Medication Sig Start Date End Date Taking? Authorizing Provider  albuterol (PROVENTIL) (2.5 MG/3ML) 0.083% nebulizer solution Take 3 mLs (2.5 mg total) by nebulization every 6 (six) hours as needed for wheezing or shortness of breath. 07/09/22 03/09/23  Pokhrel, Rebekah Chesterfield, MD  aspirin EC 81 MG tablet Take 1 tablet (81 mg total) by mouth daily. Swallow whole. Patient taking differently: Take 81 mg by mouth at bedtime. Chewable 07/16/22   Nahser, Deloris Ping, MD  Bismuth Tribromoph-Petrolatum (XEROFORM OCCLUSIVE GAUZE STRIP)  PADS Apply 1 each topically as directed. 09/17/22   Rolly Salter, MD  bumetanide (BUMEX) 1 MG tablet TAKE 1 TABLET BY MOUTH TWICE A DAY Patient taking differently: Take 1 mg by mouth 2 (two) times daily. 11/20/22   Nahser, Deloris Ping, MD  buPROPion (WELLBUTRIN XL) 150 MG 24 hr tablet TAKE 1 TABLET BY MOUTH EVERY DAY 02/07/23   Mozingo, Thereasa Solo, NP  clonazePAM (KLONOPIN) 1 MG tablet Take 1 tablet (1 mg total) by mouth 2 (two) times daily. Patient taking differently: Take 1 mg by mouth in the morning, at noon, and at bedtime. 02/15/23   Mozingo, Thereasa Solo, NP  dexlansoprazole (DEXILANT) 60 MG capsule Take 60 mg by mouth daily.    [provider]  Diclofenac Potassium,Migraine, (CAMBIA) 50 MG PACK Take 50 mg by mouth daily as needed for migraine. 05/16/21    [provider]  diphenhydrAMINE (BENADRYL) 12.5 MG/5ML elixir Take 10 mLs (25 mg total) by mouth every 6 (six) hours as needed (nausea). 09/17/22   Rolly Salter, MD  dronabinol (MARINOL) 2.5 MG capsule TAKE 1 CAPSULE BY MOUTH TWICE A DAY AS NEEDED NAUSEA Patient taking differently: Take 2.5 mg by mouth at bedtime. 09/10/22   Serena Croissant, MD  EMGALITY 120 MG/ML SOSY Inject 120 mg into the skin every 28 (twenty-eight) days.    [provider]  EPINEPHRINE 0.3 mg/0.3 mL IJ SOAJ injection INJECT 0.3 MG INTO THE MUSCLE AS NEEDED FOR ANAPHYLAXIS. 01/21/23   Luciano Cutter, MD  escitalopram (LEXAPRO) 20 MG tablet TAKE 1 TABLET BY MOUTH EVERY DAY IN THE EVENING Patient taking differently: Take 20 mg by mouth every evening. 03/08/23   Mozingo, Thereasa Solo, NP  famotidine (PEPCID) 40 MG/5ML suspension Take 2.5 mLs (20 mg total) by mouth daily. 03/30/23 04/29/23  Curatolo, Adam, DO  FLORASTOR 250 MG capsule Take 250 mg by mouth 2 (two) times daily.    [provider]  fluticasone-salmeterol (ADVAIR HFA) 230-21 MCG/ACT inhaler INHALE 2 PUFFS INTO THE LUNGS TWICE A DAY 04/16/23   Luciano Cutter, MD  guaiFENesin (ROBITUSSIN) 100 MG/5ML liquid Take 15 mLs by mouth every 6 (six) hours as needed for cough or to loosen phlegm. 07/09/22   Pokhrel, Rebekah Chesterfield, MD  Heparin Na, Pork, Lock Flsh PF (BD HEPARIN POSIFLUSH) 100 UNIT/ML SOLN USE 5 MLS IN PORT A CATH ONCE DAILY AFTER MEDICATION ADMINISTRATION AS A HEPLOCK AS DIRECTED. 03/05/23     hydrocortisone (CORTEF) 10 MG tablet Take 1-2 tablets (10-20 mg total) by mouth See admin instructions. Take 20 mg in the am and 10mg  in the evening. Take after completing prednisone 09/29/22   Rolly Salter, MD  HYDROmorphone (DILAUDID) 4 MG tablet Take 1 tablet (4 mg total) by mouth every 4 (four) hours as needed. Patient taking differently: Take 4 mg by mouth every 4 (four) hours as needed for moderate pain. 12/28/22     hydrOXYzine (ATARAX) 25 MG  tablet Take 1 tablet (25 mg total) by mouth every 6 (six) hours as needed for anxiety. 01/24/23   Azucena Fallen, MD  lamoTRIgine (LAMICTAL) 200 MG tablet TAKE 1 TABLET BY MOUTH EVERYDAY AT BEDTIME Patient taking differently: Take 200 mg by mouth at bedtime. 12/24/22   Mozingo, Thereasa Solo, NP  lamoTRIgine (LAMICTAL) 25 MG tablet Take 1 tablet (25 mg total) by mouth daily. 03/04/23   Mozingo, Thereasa Solo, NP  Lancets [facility] DELICA PLUS [place]) MISC 3 (three) times daily. for testing 06/12/21   [provider]  LORazepam (ATIVAN) 0.5 MG tablet Take 1 tablet (0.5 mg total) by mouth 3 (three) times daily as needed for anxiety. 02/11/23   Mozingo, Thereasa Solo, NP  menthol-cetylpyridinium (CEPACOL) 3 MG lozenge Take 1 lozenge (3 mg total) by mouth as needed for sore throat. 09/17/22   Rolly Salter, MD  nitroGLYCERIN (NITROSTAT) 0.4 MG SL tablet Dissolve 1 tablet under the tongue every 5 minutes as needed for chest pain. Max of 3 doses, then 911. 08/20/22   Nahser, Deloris Ping, MD  [facility] VERIO test strip 3 (three) times daily. for testing 06/12/21   [provider]  oxyCODONE (OXY IR/ROXICODONE) 5 MG immediate release tablet Take 1 tablet (5 mg total) by mouth every 4 (four) hours as needed for pain 04/05/23     oxyCODONE (ROXICODONE) 5 MG/5ML solution Take 5 mLs (5 mg total) by mouth every 4 (four) hours as needed for severe pain. 03/30/23   Curatolo, Adam, DO  OXYGEN Inhale 4-5 L/min into the lungs continuous.    [provider]  PROAIR HFA 108 [phone] Base) MCG/ACT inhaler Inhale 2 puffs into the lungs every 4 (four) hours as needed for wheezing or shortness of breath.    [provider]  QVAR REDIHALER 80 MCG/ACT inhaler INHALE 1 PUFF INTO THE LUNGS TWICE A DAY Patient taking differently: Inhale 1 puff into the lungs 2 (two) times daily. 01/21/23   Luciano Cutter, MD  Rimegepant Sulfate (NURTEC) 75 MG TBDP Take 75 mg by mouth daily as needed (Migraine).      [provider]  rosuvastatin (CRESTOR) 10 MG tablet Take 10 mg by mouth daily. Mid morning 02/28/18   [provider]  sacubitril-valsartan (ENTRESTO) 24-26 MG Take 1 tablet by mouth 2 (two) times daily. 09/22/22   Rolly Salter, MD  silver sulfADIAZINE (SILVADENE) 1 % cream Apply to affected area daily Patient taking differently: Apply 1 Application topically daily as needed. 09/17/22 09/17/23  Rolly Salter, MD  Sodium Chloride Flush (NORMAL SALINE FLUSH) 0.9 % SOLN Use as directed for central venous catheter maintenance 03/05/23     Sodium Chloride Flush (NORMAL SALINE FLUSH) 0.9 % SOLN Use as directed for central venous catheter maintenance 01/31/23     sucralfate (CARAFATE) 1 GM/10ML suspension Take 1 g by mouth daily as needed (as directed for ulcers).    [provider]  SYNTHROID 100 MCG tablet Take 1 tablet (100 mcg total) by mouth every morning. Patient taking differently: Take 100 mcg by mouth every morning. * Must be name brand* 10/19/21   Serena Croissant, MD  Tiotropium Bromide Monohydrate (SPIRIVA RESPIMAT) 1.25 MCG/ACT AERS Inhale 2 puffs into the lungs daily. 08/15/22   Luciano Cutter, MD  topiramate (TOPAMAX) 50 MG tablet Take 150 mg by mouth at bedtime.  12/25/19   [provider]  zolpidem (AMBIEN CR) 12.5 MG CR tablet Take 12.5 mg by mouth at bedtime. 03/05/22   [provider]      Allergies    Ferrous bisglycinate chelate [iron], Mushroom extract complex, Na ferric gluc cplx in sucrose, Cymbalta [duloxetine hcl], Hydromorphone, Ondansetron hcl, Promethazine, Succinylcholine, Buprenorphine hcl, Compazine, Duloxetine, Metoclopramide, Morphine and codeine, Ondansetron, Promethazine hcl, and Tegaderm ag mesh [silver]    Review of Systems   Review of Systems  Gastrointestinal:  Positive for vomiting.    Physical Exam Updated Vital Signs BP 100/64   Pulse 93   Temp 98.2 F (36.8 C)   Resp 17  LMP  (LMP Unknown)   SpO2 100%   Physical Exam Vitals reviewed.  Cardiovascular:     Rate and Rhythm: Normal rate.  Pulmonary:     Breath sounds: No wheezing.     Comments: catheter to right chest wall without erythema. Abdominal:     Tenderness: There is no abdominal tenderness.  Musculoskeletal:     Right lower leg: Edema present.     Left lower leg: Edema present.     Comments: Mild edema bilateral lower extremities.  Neurological:     Mental Status: She is alert.     ED Results / Procedures / Treatments   Labs (all labs ordered are listed, but only abnormal results are displayed) Labs Reviewed  COMPREHENSIVE METABOLIC PANEL - Abnormal; Notable for the following components:      Result Value   Sodium 133 (*)    Glucose, Bld 169 (*)    BUN 33 (*)    Creatinine, Ser 1.62 (*)    Calcium 8.8 (*)    GFR, Estimated 38 (*)    All other components within normal limits  CBC WITH DIFFERENTIAL/PLATELET - Abnormal; Notable for the following components:   WBC 23.1 (*)    Hemoglobin 9.6 (*)    HCT 31.6 (*)    MCV 79.8 (*)    MCH 24.2 (*)    RDW 17.1 (*)    Platelets 447 (*)    Neutro Abs 19.7 (*)    Abs Immature Granulocytes 0.13 (*)    All other components within normal limits  CULTURE, BLOOD (ROUTINE X 2)  CULTURE, BLOOD (ROUTINE X 2)  RESPIRATORY PANEL BY PCR  SARS CORONAVIRUS 2 BY RT PCR  BRAIN NATRIURETIC PEPTIDE  LIPASE, BLOOD  LACTIC ACID, PLASMA  URINALYSIS, W/ REFLEX TO CULTURE (INFECTION SUSPECTED)  LACTIC ACID, PLASMA    EKG None  Radiology DG Chest Portable 1 View  Result Date: 04/22/2023 CLINICAL DATA:  Weakness. EXAM: PORTABLE CHEST 1 VIEW COMPARISON:  March 30, 2023. FINDINGS: The heart size and mediastinal contours are within normal limits. Right internal jugular catheter is unchanged. Hypoinflation of the lungs is noted with mild bibasilar subsegmental atelectasis or infiltrate. The visualized skeletal structures are unremarkable. IMPRESSION: Hypoinflation of the lungs with mild  bibasilar subsegmental atelectasis or infiltrates. Electronically Signed   By: Lupita Raider M.D.   On: 04/22/2023 18:05    Procedures Procedures    Medications Ordered in ED Medications  sodium chloride 0.9 % bolus 500 mL (0 mLs Intravenous Stopped 04/22/23 2017)  hydrocortisone sodium succinate (SOLU-CORTEF) 100 MG injection 100 mg (100 mg Intravenous Given 04/22/23 2018)    ED Course/ Medical Decision Making/ A&P                             Medical Decision Making Amount and/or Complexity of Data Reviewed Labs: ordered. Radiology: ordered.  Risk Prescription drug management. Decision regarding hospitalization.   Patient with weakness reported fever and hypotension at home.  Had previous sepsis but also previous addisonian crisis.  Has had nausea and vomiting.  Differential diagnosis includes infection, dehydration.  Will get basic blood work.  Of note happy to see her husband yesterday at a different hospital and she was there with him.  Reviewed recent discharge note.  White count is elevated but normal lactic acid.  White count could be elevated from recent steroid she took at home.  No fever here.  Blood pressure has  been better here compared to at home.  I think however that with the vomiting and Addison's disease likely needs admission for IV steroids.  Have consulted infectious disease but have not heard back yet.  X-ray is stable from before with atelectasis versus pneumonia.  Discussed with Dr. Renold Don from infectious disease.  Recommended sending complete respiratory virus panel and treating with cefepime and Vanco.  CRITICAL CARE Performed by: Benjiman Core Total critical care time: 30 minutes Critical care time was exclusive of separately billable procedures and treating other patients. Critical care was necessary to treat or prevent imminent or life-threatening deterioration. Critical care was time spent personally by me on the following activities: development  of treatment plan with patient and/or surrogate as well as nursing, discussions with consultants, evaluation of patient's response to treatment, examination of patient, obtaining history from patient or surrogate, ordering and performing treatments and interventions, ordering and review of laboratory studies, ordering and review of radiographic studies, pulse oximetry and re-evaluation of patient's condition.        Final Clinical Impression(s) / ED Diagnoses Final diagnoses:  Nausea and vomiting, unspecified vomiting type  Addisonian crisis [facility])    Rx / DC Orders ED Discharge Orders     None         Benjiman Core, MD 04/22/23 2019

## 2023-04-22 NOTE — H&P (Signed)
History and Physical    KENNADIE BRENNER AOZ:[id number] DOB: 04/20/1971 DOA: 04/22/2023  PCP: Audery Amel, MD  Patient coming from: Home  Chief Complaint: Generalized weakness  HPI: [name] is a 52 y.o. 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  She was admitted last month 6/8-6/12 for acute on chronic respiratory failure due to pneumonia.  Patient follows with palliative care and hospice.  Patient presents to the ED today complaining of vomiting and generalized weakness.  She reported fever and hypotension at home. She has a central line in place.  In the ED, patient was afebrile but blood pressure slightly low with systolic around 100.  No change in oxygen requirement from baseline.  Labs showing WBC 23.1, hemoglobin 9.6 (at baseline), platelet count 447k, sodium 133, glucose 169, BUN 33, creatinine 1.6 (baseline 1.0-1.2), normal lipase and LFTs, BNP normal, lactic acid 1.7, UA not suggestive of infection, SARS-CoV-2 PCR negative, respiratory viral panel pending, blood cultures collected.  Chest x-ray showing mild bibasilar subsegmental atelectasis or infiltrates.  ED physician discussed with Dr. Renold Don with ID who recommended starting the patient on vancomycin and cefepime.  Patient was also given IV hydrocortisone 100 mg and 500 mL normal saline in the ED.   Patient states this morning she woke up with a fever  (temperature 100.9 F) and severe shaking chills.  She vomited in the morning.  She continued to feel very weak and in the afternoon when she checked her blood pressure it was low with systolic in the 80s.  She called her endocrinologist and was advised to urgently come into the emergency room to be evaluated.  She is taking Cortef 20 mg in the morning and 10 mg in the afternoon and has not missed any doses.  Patient states she has chronic cough and shortness of breath and uses 5 L oxygen at home all the time.  She feels her respiratory status is at baseline.  She reports chronic poor p.o. intake due to throat pain which she thinks is due to ulcers in her throat due history of acid reflux and radiation therapy in the past.  No chest or abdominal pain.  She has a right IJ central venous catheter which was replaced 3 weeks ago and reports history of bacteremia in the past.  Review of Systems:  Review of Systems  All other systems reviewed and are negative.   Past Medical History:  Diagnosis Date   Addison's disease (HCC)    Adrenal insufficiency (HCC)    Anemia    Anxiety    Aortic stenosis    Aortic stenosis    Appendicitis 12/19/09   Appendicitis    Breast cancer (HCC)    STATUS POST BILATERAL MASTECTOMY. STATUS POST RECONSTRUCTION. SHE HAD SILICONE BREAST IMPLANTS AND THE LEFT IMPLANT IS LEAKING SLIGHTLY   Cellulitis of right middle finger 11/07/2018   Cervical cancer (HCC) 12/23/2018   Chest pain    CHF with right heart failure (HCC) 04/17/2017   Chronic respiratory failure with hypoxia (HCC) 12/23/2018   Cough variant asthma 04/13/2019  Depression    GERD (gastroesophageal reflux disease)    takes Dexilant and carafate and gi coctail    Headache    migraines on a daily and monthly regimen    Heart murmur    History of kidney stones    Hodgkin lymphoma (HCC)    STATUS POST MANTLE RADIATION   Hodgkin's lymphoma (HCC)    1987   Hypertension    Hypoxia    Necrotizing fasciitis (HCC)  12/23/2018   Non-ischemic cardiomyopathy (HCC)    Osteoporosis    Palpitations    Pituitary adenoma (HCC) 12/23/2018   Pneumonia    PONV (postoperative nausea and vomiting)    Pre-diabetes    per pt; no meds   Pulmonary hypertension (HCC) 12/23/2018   Raynaud phenomenon    Right heart failure (HCC) 04/17/2017   Seizures (HCC)    last febrile seizure was approx 3 weeks ago per report on 12/01/2020   Sleep apnea    upcoming sleep study per pt    Supplemental oxygen dependent    3 liters   SVT (supraventricular tachycardia)    Tachycardia    Thyroid cancer (HCC)    STATUS POST SURGICAL REMOVAL-CURRENT ON THYROID REPLACEMENT    Past Surgical History:  Procedure Laterality Date   ABDOMINAL HYSTERECTOMY     AMPUTATION Left 01/30/2019   Procedure: Left Index finger amputation with flap reconstruction and repair reconstruction;  Surgeon: Dominica Severin, MD;  Location: [facility] OR;  Service: Orthopedics;  Laterality: Left;   APPENDECTOMY     breast implants and removal      breast implants but leaking      CARDIAC CATHETERIZATION  05/18/09   NORMAL CATH   COLONOSCOPY     hx of chemotherapy      hx of radiation therapy      I & D EXTREMITY Left 12/23/2018   Procedure: IRRIGATION AND DEBRIDEMENT HAND / INDEX FINGER;  Surgeon: Dominica Severin, MD;  Location: [facility] OR;  Service: Orthopedics;  Laterality: Left;   IR CV LINE INJECTION  03/22/2022   IR FLUORO GUIDE CV LINE RIGHT  10/09/2022   IR FLUORO GUIDE CV LINE RIGHT  01/18/2023   IR FLUORO GUIDE CV LINE RIGHT  03/26/2023   IR IMAGING GUIDED PORT INSERTION  05/06/2020   IR IMAGING GUIDED PORT INSERTION  12/04/2021   IR REMOVAL TUN ACCESS W/ PORT W/O FL MOD SED  04/27/2020   IR REMOVAL TUN ACCESS W/ PORT W/O FL MOD SED  12/04/2021   IR REMOVAL TUN ACCESS W/ PORT W/O FL MOD SED  03/30/2022   IR US GUIDE VASC ACCESS RIGHT  10/09/2022   KIDNEY STONE SURGERY     LUMBAR PUNCTURE W/ INTRATHECAL CHEMOTHERAPY     MASTECTOMY     PITUITARY SURGERY     RIGHT/LEFT HEART  CATH AND CORONARY ANGIOGRAPHY N/A 04/02/2018   Procedure: RIGHT/LEFT HEART CATH AND CORONARY ANGIOGRAPHY;  Surgeon: Kathleene Hazel, MD;  Location: [facility] IN[facility]ASIVE [facility];  Service: Cardiovascular;  Laterality: N/A;   RIGHT/LEFT HEART CATH AND CORONARY ANGIOGRAPHY N/A 08/31/2022   Procedure: RIGHT/LEFT HEART CATH AND CORONARY ANGIOGRAPHY;  Surgeon: Dolores Patty, MD;  Location: [facility] INVAS[facility] CV [facility];  Service: Cardiovascular;  Laterality: N/A;   TOOTH EXTRACTION N/A 12/05/2020   Procedure: DENTAL RESTORATION/EXTRACTIONS;  Surgeon: Lovena Neighbours, MD;  Location: [facility] ORS;  Service: Oral Surgery;  Laterality: N/A;   TOTAL THYROIDECTOMY     VIDEO BRONCHOSCOPY Bilateral 11/14/2018   Procedure: VIDEO BRONCHOSCOPY  WITHOUT FLUORO;  Surgeon: Luciano Cutter, MD;  Location: [facility] ENDOSCOPY;  Service: Cardiopulmonary;  Laterality: Bilateral;   VIDEO BRONCHOSCOPY WITH ENDOBRONCHIAL ULTRASOUND N/A 11/19/2018   Procedure: VIDEO BRONCHOSCOPY WITH ENDOBRONCHIAL ULTRASOUND;  Surgeon: Luciano Cutter, MD;  Location: [facility] OR;  Service: Thoracic;  Laterality: N/A;     reports that she has never smoked. She has never used smokeless tobacco. She reports that she does not currently use alcohol. She reports that she does not use drugs.  Allergies  Allergen Reactions   Ferrous Bisglycinate Chelate [Iron] Anaphylaxis and Other (See Comments)    Only IV - only FERRLICET   Mushroom Extract Complex Anaphylaxis   Na Ferric Gluc Cplx In Sucrose Anaphylaxis   Cymbalta [Duloxetine Hcl] Swelling and Anxiety   Hydromorphone Other (See Comments)    BP drop and heart rate drops 5.1.20 PT REPORTS THAT SHE TAKES DILAUDID AT HOME   Ondansetron Hcl Other (See Comments)   Promethazine Other (See Comments)    Other reaction(s): Unknown   Succinylcholine Other (See Comments)    Lock Jaw   Buprenorphine Hcl Hives   Compazine Other (See Comments)    Altered mental status Aggression   Duloxetine     Other reaction(s):  Not available   Metoclopramide Other (See Comments)    Dystonia   Morphine And Codeine Hives   Ondansetron Hives and Rash    Other reaction(s): Unknown Other reaction(s): Unknown   Promethazine Hcl Hives   Tegaderm Ag Mesh [Silver] Rash    Old formulation only, is able to tolerate new formulation    Family History  Family history unknown: Yes    Prior to Admission medications   Medication Sig Start Date End Date Taking? Authorizing Provider  albuterol (PROVENTIL) (2.5 MG/3ML) 0.083% nebulizer solution Take 2.5 mg by nebulization every 6 (six) hours as needed for wheezing or shortness of breath.   Yes [provider]  aspirin EC 81 MG tablet Take 1 tablet (81 mg total) by mouth daily. Swallow whole. Patient taking differently: Take 81 mg by mouth at bedtime. Chewable 07/16/22  Yes Nahser, Deloris Ping, MD  bumetanide (BUMEX) 1 MG tablet TAKE 1 TABLET BY MOUTH TWICE A DAY Patient taking differently: Take 1 mg by mouth 2 (two) times daily. 11/20/22  Yes Nahser, Deloris Ping, MD  buPROPion (WELLBUTRIN XL) 150 MG 24 hr tablet TAKE 1 TABLET BY MOUTH EVERY DAY Patient taking differently: Take 150 mg by mouth in the morning. Mid Morning 02/07/23  Yes Mozingo, Thereasa Solo, NP  clonazePAM (KLONOPIN) 1 MG tablet Take 1 tablet (1 mg total) by mouth 2 (two) times daily. 02/15/23  Yes Mozingo, Thereasa Solo, NP  dexlansoprazole (DEXILANT) 60 MG capsule Take 60 mg by mouth in the morning.   Yes [provider]  diphenhydrAMINE (BENADRYL) 12.5 MG/5ML elixir Take 10 mLs (25 mg total) by mouth every 6 (six) hours as needed (nausea). 09/17/22  Yes Rolly Salter, MD  EPINEPHRINE 0.3 mg/0.3 mL IJ SOAJ injection INJECT 0.3 MG INTO THE MUSCLE AS NEEDED FOR ANAPHYLAXIS. 01/21/23  Yes Luciano Cutter, MD  escitalopram (LEXAPRO) 20 MG tablet TAKE 1 TABLET BY MOUTH EVERY DAY IN THE EVENING Patient taking differently: Take 20 mg by mouth daily in the afternoon. Afternoon 03/08/23  Yes Mozingo, Thereasa Solo, NP  famotidine (PEPCID) 40 MG/5ML suspension Take 2.5 mLs (20 mg total) by mouth daily. 03/30/23 04/29/23 Yes Curatolo, Adam, DO  hydrocortisone (CORTEF) 10 MG tablet Take 1-2 tablets (10-20 mg  total) by mouth See admin instructions. Take 20 mg in the am and 10mg  in the evening. Take after completing prednisone Patient taking differently: Take 10-20 mg by mouth See admin instructions. Take 20 mg in the am at 10 am and 10 mg in the afternoon. Take after completing prednisone 09/29/22  Yes Rolly Salter, MD  hydrOXYzine (ATARAX) 25 MG tablet Take 1 tablet (25 mg total) by mouth every 6 (six) hours as needed for anxiety. 01/24/23  Yes Azucena Fallen, MD  JANUVIA 100 MG tablet Take 100 mg by mouth daily. 04/11/23  Yes [provider]  lamoTRIgine (LAMICTAL) 200 MG tablet TAKE 1 TABLET BY MOUTH EVERYDAY AT BEDTIME Patient taking differently: Take 200 mg by mouth at bedtime. 12/24/22  Yes Mozingo, Thereasa Solo, NP  lamoTRIgine (LAMICTAL) 25 MG tablet Take 1 tablet (25 mg total) by mouth daily. Patient taking differently: Take 25 mg by mouth every morning. Mid Morning 03/04/23  Yes Mozingo, Thereasa Solo, NP  LORazepam (ATIVAN) 0.5 MG tablet Take 1 tablet (0.5 mg total) by mouth 3 (three) times daily as needed for anxiety. Patient taking differently: Place 0.5 mg into feeding tube 3 (three) times daily as needed for anxiety. nausea 02/11/23  Yes Mozingo, Thereasa Solo, NP  nitroGLYCERIN (NITROSTAT) 0.4 MG SL tablet Dissolve 1 tablet under the tongue every 5 minutes as needed for chest pain. Max of 3 doses, then 911. 08/20/22  Yes Nahser, Deloris Ping, MD  pantoprazole (PROTONIX) 40 MG tablet Take 40 mg by mouth in the morning. 04/18/23  Yes [provider]  rosuvastatin (CRESTOR) 10 MG tablet Take 10 mg by mouth in the morning. Mid morning 02/28/18  Yes [provider]  sacubitril-valsartan (ENTRESTO) 24-26 MG Take 1 tablet by mouth 2 (two) times daily. Patient taking  differently: Take 1 tablet by mouth daily. Afternoon 09/22/22  Yes Rolly Salter, MD  sucralfate (CARAFATE) 1 GM/10ML suspension Take 1 g by mouth daily as needed (as directed for ulcers).   Yes [provider]  SYNTHROID 100 MCG tablet Take 1 tablet (100 mcg total) by mouth every morning. Patient taking differently: Take 100 mcg by mouth every morning. * Must be name brand* 10/19/21  Yes Serena Croissant, MD  Tiotropium Bromide Monohydrate (SPIRIVA RESPIMAT) 1.25 MCG/ACT AERS Inhale 2 puffs into the lungs daily. 08/15/22  Yes Luciano Cutter, MD  topiramate (TOPAMAX) 50 MG tablet Take 150 mg by mouth at bedtime.  12/25/19  Yes [provider]  zolpidem (AMBIEN CR) 12.5 MG CR tablet Take 12.5 mg by mouth at bedtime. 03/05/22  Yes [provider]  albuterol (PROVENTIL) (2.5 MG/3ML) 0.083% nebulizer solution Take 3 mLs (2.5 mg total) by nebulization every 6 (six) hours as needed for wheezing or shortness of breath. 07/09/22 03/09/23  Pokhrel, Rebekah Chesterfield, MD  Bismuth Tribromoph-Petrolatum (XEROFORM OCCLUSIVE GAUZE STRIP) PADS Apply 1 each topically as directed. 09/17/22   Rolly Salter, MD  dronabinol (MARINOL) 2.5 MG capsule TAKE 1 CAPSULE BY MOUTH TWICE A DAY AS NEEDED NAUSEA Patient taking differently: Take 2.5 mg by mouth at bedtime. 09/10/22   Serena Croissant, MD  EMGALITY 120 MG/ML SOSY Inject 120 mg into the skin every 28 (twenty-eight) days.    [provider]  FLORASTOR 250 MG capsule Take 250 mg by mouth 2 (two) times daily.    [provider]  fluticasone-salmeterol (ADVAIR HFA) 230-21 MCG/ACT inhaler INHALE 2 PUFFS INTO THE LUNGS TWICE A DAY 04/16/23   Luciano Cutter, MD  guaiFENesin (ROBITUSSIN) 100 MG/5ML liquid Take 15 mLs by mouth every 6 (six) hours as needed for cough or to loosen phlegm. 07/09/22   Pokhrel, Rebekah Chesterfield, MD  Heparin Na, Pork, Lock Flsh PF (BD HEPARIN POSIFLUSH) 100 UNIT/ML SOLN USE 5 MLS IN PORT A CATH ONCE DAILY AFTER MEDICATION  ADMINISTRATION AS A HEPLOCK AS DIRECTED. 03/05/23     HYDROmorphone (DILAUDID) 4 MG tablet Take 1 tablet (4 mg total) by mouth every 4 (four) hours as needed. Patient taking differently: Take 4 mg by mouth every 4 (four) hours as needed for moderate pain. 12/28/22     Lancets (ONETOUCH DELICA PLUS LANCET33G) MISC 3 (three) times daily. for testing 06/12/21   [provider]  menthol-cetylpyridinium (CEPACOL) 3 MG lozenge Take 1 lozenge (3 mg total) by mouth as needed for sore throat. 09/17/22   Rolly Salter, MD  [facility] VERIO test strip 3 (three) times daily. for testing 06/12/21   [provider]  oxyCODONE (OXY IR/ROXICODONE) 5 MG immediate release tablet Take 1 tablet (5 mg total) by mouth every 4 (four) hours as needed for pain 04/05/23     oxyCODONE (ROXICODONE) 5 MG/5ML solution Take 5 mLs (5 mg total) by mouth every 4 (four) hours as needed for severe pain. 03/30/23   Curatolo, Adam, DO  OXYGEN Inhale 4-5 L/min into the lungs continuous.    [provider]  PROAIR HFA 108 [phone] Base) MCG/ACT inhaler Inhale 2 puffs into the lungs every 4 (four) hours as needed for wheezing or shortness of breath.    [provider]  QVAR REDIHALER 80 MCG/ACT inhaler INHALE 1 PUFF INTO THE LUNGS TWICE A DAY Patient taking differently: Inhale 1 puff into the lungs 2 (two) times daily. 01/21/23   Luciano Cutter, MD  Rimegepant Sulfate (NURTEC) 75 MG TBDP Take 75 mg by mouth daily as needed (Migraine).     [provider]  silver sulfADIAZINE (SILVADENE) 1 % cream Apply to affected area daily Patient taking differently: Apply 1 Application topically daily as needed. 09/17/22 09/17/23  Rolly Salter, MD  Sodium Chloride Flush (NORMAL SALINE FLUSH) 0.9 % SOLN Use as directed for central venous catheter maintenance 03/05/23     Sodium Chloride Flush (NORMAL SALINE FLUSH) 0.9 % SOLN Use as directed for central venous catheter maintenance 01/31/23       Physical Exam: Vitals:    04/22/23 1618 04/22/23 1619 04/22/23 2013 04/22/23 2015  BP:  102/62 100/64 101/63  Pulse:  92 93   Resp:  (!) 24 17   Temp: 98.2 F (36.8 C)  98.2 F (36.8 C)   TempSrc: Oral     SpO2:  100% 100%   Weight:    77.2 kg  Height:    5\' 5"  (1.651 m)    Physical Exam Vitals reviewed.  Constitutional:      General: She is not in acute distress. HENT:     Head: Normocephalic and atraumatic.  Eyes:     Extraocular Movements: Extraocular movements intact.  Cardiovascular:     Rate and Rhythm: Normal rate and regular rhythm.     Pulses: Normal pulses.     Heart sounds: Murmur heard.  Pulmonary:     Effort: Pulmonary effort is normal. No respiratory distress.     Breath sounds: Normal breath sounds. No wheezing or rales.  Abdominal:     General: Bowel sounds are normal. There is no distension.     Palpations: Abdomen is soft.  Tenderness: There is no abdominal tenderness.  Musculoskeletal:     Cervical back: Normal range of motion.     Right lower leg: No edema.     Left lower leg: No edema.     Comments: No obvious signs of infection at right IJ central venous catheter site.  Skin:    General: Skin is warm and dry.  Neurological:     General: No focal deficit present.     Mental Status: She is alert and oriented to person, place, and time.     Labs on Admission: I have personally reviewed following labs and imaging studies  CBC: Recent Labs  Lab 04/22/23 1735  WBC 23.1*  NEUTROABS 19.7*  HGB 9.6*  HCT 31.6*  MCV 79.8*  PLT 447*   Basic Metabolic Panel: Recent Labs  Lab 04/22/23 1735  NA 133*  K 3.8  CL 99  CO2 22  GLUCOSE 169*  BUN 33*  CREATININE 1.62*  CALCIUM 8.8*   GFR: Estimated Creatinine Clearance: 41.7 mL/min (A) (by C-G formula based on SCr of 1.62 mg/dL (H)). Liver Function Tests: Recent Labs  Lab 04/22/23 1735  AST 22  ALT 32  ALKPHOS 63  BILITOT 0.5  PROT 7.9  ALBUMIN 3.9   Recent Labs  Lab 04/22/23 1735  LIPASE 36   No  results for input(s): "AMMONIA" in the last 168 hours. Coagulation Profile: No results for input(s): "INR", "PROTIME" in the last 168 hours. Cardiac Enzymes: No results for input(s): "CKTOTAL", "CKMB", "CKMBINDEX", "TROPONINI" in the last 168 hours. BNP (last 3 results) No results for input(s): "PROBNP" in the last 8760 hours. HbA1C: No results for input(s): "HGBA1C" in the last 72 hours. CBG: No results for input(s): "GLUCAP" in the last 168 hours. Lipid Profile: No results for input(s): "CHOL", "HDL", "LDLCALC", "TRIG", "CHOLHDL", "LDLDIRECT" in the last 72 hours. Thyroid Function Tests: No results for input(s): "TSH", "T4TOTAL", "FREET4", "T3FREE", "THYROIDAB" in the last 72 hours. Anemia Panel: No results for input(s): "VITAMINB12", "FOLATE", "FERRITIN", "TIBC", "IRON", "RETICCTPCT" in the last 72 hours. Urine analysis:    Component Value Date/Time   COLORURINE YELLOW 04/22/2023 1752   APPEARANCEUR CLEAR 04/22/2023 1752   LABSPEC 1.014 04/22/2023 1752   PHURINE 5.0 04/22/2023 1752   GLUCOSEU NEGATIVE 04/22/2023 1752   HGBUR NEGATIVE 04/22/2023 1752   BILIRUBINUR NEGATIVE 04/22/2023 1752   KETONESUR NEGATIVE 04/22/2023 1752   PROTEINUR NEGATIVE 04/22/2023 1752   UROBILINOGEN 1.0 06/16/2015 1045   NITRITE NEGATIVE 04/22/2023 1752   LEUKOCYTESUR NEGATIVE 04/22/2023 1752    Radiological Exams on Admission: DG Chest Portable 1 View  Result Date: 04/22/2023 CLINICAL DATA:  Weakness. EXAM: PORTABLE CHEST 1 VIEW COMPARISON:  March 30, 2023. FINDINGS: The heart size and mediastinal contours are within normal limits. Right internal jugular catheter is unchanged. Hypoinflation of the lungs is noted with mild bibasilar subsegmental atelectasis or infiltrate. The visualized skeletal structures are unremarkable. IMPRESSION: Hypoinflation of the lungs with mild bibasilar subsegmental atelectasis or infiltrates. Electronically Signed   By: Lupita Raider M.D.   On: 04/22/2023 18:05    EKG:  Independently reviewed.  Sinus rhythm, no significant change since previous tracing.  Assessment and Plan  Possible adrenal crisis Patient with history of resection of pituitary adenoma resulting in chronic adrenal insufficiency.  She is taking hydrocortisone 20 mg in the morning and 10 mg in the afternoon at home and denies missing any doses.  She is presenting with generalized weakness/fatigue, nausea, vomiting, mild hyponatremia, and slight hypotension.  She was given IV hydrocortisone 100 mg in the ED.  Continue IV hydrocortisone 50 mg every 6 hours and IV fluid hydration.  Check cortisol level and monitor sodium, potassium, and glucose.  Leukocytosis ?Pneumonia Patient has chronic mild leukocytosis in the setting of steroid use but WBC count appears worse, now 23.1.  Patient reports temperature of 100.9 F at home but has been afebrile in the ED.  Slightly hypotensive with SBP around 100.  No tachycardia or lactic acidosis.  No signs of sepsis at this time.  UA not suggestive of infection.  Chest x-ray showing mild bibasilar subsegmental atelectasis or infiltrates.  She was treated for pneumonia last month.  Patient reports chronic cough and shortness of breath and feels that her respiratory status is at baseline.  She is chronically on 5 L home oxygen and no change in oxygen requirement from baseline.  SARS-CoV-2 PCR negative.  Respiratory viral panel pending.  She is at risk for bacteremia given presence of right IJ central venous catheter.  Continue vancomycin and cefepime per ID recommendations.  Blood cultures pending.  Monitor WBC count and check procalcitonin level.  AKI on CKD stage II-IIIa Likely prerenal azotemia from dehydration in the setting of poor p.o. intake.  BUN 33, creatinine 1.6 (baseline 1.0-1.2).  Continue IV fluid hydration and monitor renal function.  Avoid nephrotoxic agents/hold home Bumex and Entresto.  Chronic anemia Hemoglobin at baseline and no signs of bleeding.   Continue to monitor labs.  Chronic hypoxemic respiratory failure on 4-5 L home oxygen No change in oxygen requirement from baseline.  Continue supplemental oxygen.  Asthma/chronic bronchitis No wheezing.  Continue home inhalers.  Chronic CHF Mild aortic valve insufficiency Echo done in October 2023 showing EF 60 to 65%, grade 1 diastolic dysfunction, mild AVR, and mild AVS. No signs of volume overload, BNP normal.  Hold Bumex and Entresto given AKI.  Hypothyroidism Continue Synthroid and check TSH.  Anxiety/depression Continue home medications.  GERD Continue home medications.  Hyperlipidemia Continue statin.  Type 2 diabetes A1c 6.3 in May 2023, will repeat.  Sensitive sliding scale insulin.  Hypertension Hold antihypertensives at this time given concern for possible adrenal crisis.  DVT prophylaxis: Lovenox Code Status: DNR (discussed with the patient) Family Communication: No family available at this time. Level of care: Progressive Care Unit Admission status: It is my clinical opinion that referral for OBSERVATION is reasonable and necessary in this patient based on the above information provided. The aforementioned taken together are felt to place the patient at high risk for further clinical deterioration. However, it is anticipated that the patient may be medically stable for discharge from the hospital within 24 to 48 hours.   John Giovanni MD [facility]  If 7PM-7AM, please contact night-coverage [email or web address]  04/22/2023, 10:02 PM

## 2023-04-22 NOTE — Progress Notes (Signed)
Pharmacy Antibiotic Note  [name] is a 52 y.o. female admitted on 04/22/2023 with Pt arrived via POV, c/o vomiting and weakness, feels she is starting an adrenal crisis .  Pharmacy has been consulted to dose vancomycin and cefepime for possible bacteremia.  1st doses given in the ED  Plan: Vancomycin 1500mg  IV q36h (AUC 517.9, Scr 1.62) Cefepime 2gm IV q12h Follow renal function, cultures and clinical course  Height: 5\' 5"  (165.1 cm) Weight: 77.2 kg (170 lb 4.8 oz) IBW/kg (Calculated) : 57  Temp (24hrs), Avg:98.2 F (36.8 C), Min:98.2 F (36.8 C), Max:98.2 F (36.8 C)  Recent Labs  Lab 04/22/23 1735  WBC 23.1*  CREATININE 1.62*  LATICACIDVEN 1.7    Estimated Creatinine Clearance: 41.7 mL/min (A) (by C-G formula based on SCr of 1.62 mg/dL (H)).    Allergies  Allergen Reactions   Ferrous Bisglycinate Chelate [Iron] Anaphylaxis and Other (See Comments)    Only IV - only FERRLICET   Mushroom Extract Complex Anaphylaxis   Na Ferric Gluc Cplx In Sucrose Anaphylaxis   Cymbalta [Duloxetine Hcl] Swelling and Anxiety   Hydromorphone Other (See Comments)    BP drop and heart rate drops 5.1.20 PT REPORTS THAT SHE TAKES DILAUDID AT HOME   Ondansetron Hcl Other (See Comments)   Promethazine Other (See Comments)    Other reaction(s): Unknown   Succinylcholine Other (See Comments)    Lock Jaw   Buprenorphine Hcl Hives   Compazine Other (See Comments)    Altered mental status Aggression   Duloxetine     Other reaction(s): Not available   Metoclopramide Other (See Comments)    Dystonia   Morphine And Codeine Hives   Ondansetron Hives and Rash    Other reaction(s): Unknown Other reaction(s): Unknown   Promethazine Hcl Hives   Tegaderm Ag Mesh [Silver] Rash    Old formulation only, is able to tolerate new formulation    Antimicrobials this admission: 7/22 vanc >> 7/22 cefepime >>  Dose adjustments this admission:   Microbiology results: 7/22 BCx:  7/22  Resp:  Thank you for allowing pharmacy to be a part of this patient's care.  Arley Phenix RPh 04/22/2023, 11:51 PM

## 2023-04-22 NOTE — H&P (Incomplete)
History and Physical    IKEA DEMICCO ZOX:[id number] DOB: [birth year] DOA: 04/22/2023  PCP: Audery Amel, MD  Patient coming from: Home  Chief Complaint: Generalized weakness  HPI: [name] is a 52 y.o. 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  She was admitted last month 6/8-6/12 for acute on chronic respiratory failure due to pneumonia.  Patient follows with palliative care and hospice.  Patient presents to the ED today complaining of vomiting and generalized weakness.  She reported fever and hypotension at home. She has a central line in place.  In the ED, patient was afebrile but blood pressure slightly low with systolic around 100.  No change in oxygen requirement from baseline.  Labs showing WBC 23.1, hemoglobin 9.6 (at baseline), platelet count 447k, sodium 133, glucose 169, BUN 33, creatinine 1.6 (baseline 1.0-1.2), normal lipase and LFTs, BNP normal, lactic acid 1.7, UA not suggestive of infection, SARS-CoV-2 PCR negative, respiratory viral panel pending, blood cultures collected.  Chest x-ray showing mild bibasilar subsegmental atelectasis or infiltrates.  ED physician discussed with Dr. Renold Don with ID who recommended starting the patient on vancomycin and cefepime.  Patient was also given IV hydrocortisone 100 mg and 500 mL normal saline in the ED.   Patient states this morning she woke up with a fever  (temperature 100.9 F) and severe shaking chills.  She vomited in the morning.  She continued to feel very weak and in the afternoon when she checked her blood pressure it was low with systolic in the 80s.  She called her endocrinologist and was advised to urgently come into the emergency room to be evaluated.  She is taking Cortef 20 mg in the morning and 10 mg in the afternoon and has not missed any doses.  Patient states she has chronic cough and shortness of breath and uses 5 L oxygen at home all the time.  She feels her respiratory status is at baseline.  She reports chronic poor p.o. intake due to throat pain which she thinks is due to ulcers in her throat due history of acid reflux and radiation therapy in the past.  No chest or abdominal pain.  She has a right IJ central venous catheter which was replaced 3 weeks ago and reports history of bacteremia in the past.  Review of Systems:  Review of Systems  All other systems reviewed and are negative.   Past Medical History:  Diagnosis Date  . Addison's disease (HCC)   . Adrenal insufficiency (HCC)   . Anemia   . Anxiety   . Aortic stenosis   . Aortic stenosis   . Appendicitis 12/19/09  . Appendicitis   . Breast cancer (HCC)    STATUS POST BILATERAL MASTECTOMY. STATUS POST RECONSTRUCTION. SHE HAD SILICONE BREAST IMPLANTS AND THE LEFT IMPLANT IS LEAKING SLIGHTLY  . Cellulitis of right middle finger 11/07/2018  . Cervical cancer (HCC) 12/23/2018  . Chest pain   . CHF with right heart failure (HCC) 04/17/2017  . Chronic respiratory failure with hypoxia (HCC) 12/23/2018  . Cough variant asthma 04/13/2019  .  Depression   . GERD (gastroesophageal reflux disease)    takes Dexilant and carafate and gi coctail   . Headache    migraines on a daily and monthly regimen   . Heart murmur   . History of kidney stones   . Hodgkin lymphoma (HCC)    STATUS POST MANTLE RADIATION  . Hodgkin's lymphoma (HCC)    1987  . Hypertension   . Hypoxia   .  Necrotizing fasciitis (HCC) 12/23/2018  . Non-ischemic cardiomyopathy (HCC)   . Osteoporosis   . Palpitations   . Pituitary adenoma (HCC) 12/23/2018  . Pneumonia   . PONV (postoperative nausea and vomiting)   . Pre-diabetes    per pt; no meds  . Pulmonary hypertension (HCC) 12/23/2018  . Raynaud phenomenon   . Right heart failure (HCC) 04/17/2017  . Seizures (HCC)    last febrile seizure was approx 3 weeks ago per report on 12/01/2020  . Sleep apnea    upcoming sleep study per pt   . Supplemental oxygen dependent    3 liters  . SVT (supraventricular tachycardia)   . Tachycardia   . Thyroid cancer (HCC)    STATUS POST SURGICAL REMOVAL-CURRENT ON THYROID REPLACEMENT    Past Surgical History:  Procedure Laterality Date  . ABDOMINAL HYSTERECTOMY    . AMPUTATION Left 01/30/2019   Procedure: Left Index finger amputation with flap reconstruction and repair reconstruction;  Surgeon: Dominica Severin, MD;  Location: [facility] OR;  Service: Orthopedics;  Laterality: Left;  . APPENDECTOMY    . breast implants and removal     . breast implants but leaking     . CARDIAC CATHETERIZATION  05/18/09   NORMAL CATH  . COLONOSCOPY    . hx of chemotherapy     . hx of radiation therapy     . I & D EXTREMITY Left 12/23/2018   Procedure: IRRIGATION AND DEBRIDEMENT HAND / INDEX FINGER;  Surgeon: Dominica Severin, MD;  Location: [facility] OR;  Service: Orthopedics;  Laterality: Left;  . IR CV LINE INJECTION  03/22/2022  . IR FLUORO GUIDE CV LINE RIGHT  10/09/2022  . IR FLUORO GUIDE CV LINE RIGHT  01/18/2023  . IR FLUORO GUIDE CV LINE RIGHT  03/26/2023  . IR IMAGING GUIDED PORT INSERTION  05/06/2020  . IR IMAGING GUIDED PORT INSERTION  12/04/2021  . IR REMOVAL TUN ACCESS W/ PORT W/O FL MOD SED  04/27/2020  . IR REMOVAL TUN ACCESS W/ PORT W/O FL MOD SED  12/04/2021  . IR REMOVAL TUN ACCESS W/ PORT W/O FL MOD SED  03/30/2022  . IR US GUIDE VASC ACCESS RIGHT  10/09/2022  . KIDNEY STONE SURGERY    . LUMBAR PUNCTURE W/ INTRATHECAL  CHEMOTHERAPY    . MASTECTOMY    . PITUITARY SURGERY    . RIGHT/LEFT HEART CATH AND CORONARY ANGIOGRAPHY N/A 04/02/2018   Procedure: RIGHT/LEFT HEART CATH AND CORONARY ANGIOGRAPHY;  Surgeon: Kathleene Hazel, MD;  Location: [facility] INVASIVE CV LAB;  Service: Cardiovascular;  Laterality: N/A;  . RIGHT/LEFT HEART CATH AND CORONARY ANGIOGRAPHY N/A 08/31/2022   Procedure: RIGHT/LEFT HEART CATH AND CORONARY ANGIOGRAPHY;  Surgeon: Dolores Patty, MD;  Location: [facility] INVASIVE CV LAB;  Service: Cardiovascular;  Laterality: N/A;  . TOOTH EXTRACTION N/A 12/05/2020   Procedure: DENTAL RESTORATION/EXTRACTIONS;  Surgeon: Lovena Neighbours, MD;  Location: [facility] ORS;  Service: Oral Surgery;  Laterality: N/A;  . TOTAL THYROIDECTOMY    . VIDEO BRONCHOSCOPY Bilateral 11/14/2018   Procedure: VIDEO BRONCHOSCOPY  WITHOUT FLUORO;  Surgeon: Luciano Cutter, MD;  Location: [facility] ENDOSCOPY;  Service: Cardiopulmonary;  Laterality: Bilateral;  . VIDEO BRONCHOSCOPY WITH ENDOBRONCHIAL ULTRASOUND N/A 11/19/2018   Procedure: VIDEO BRONCHOSCOPY WITH ENDOBRONCHIAL ULTRASOUND;  Surgeon: Luciano Cutter, MD;  Location: [facility] OR;  Service: Thoracic;  Laterality: N/A;     reports that she has never smoked. She has never used smokeless tobacco. She reports that she does not currently use alcohol. She reports that she does not use drugs.  Allergies  Allergen Reactions  . Ferrous Bisglycinate Chelate [Iron] Anaphylaxis and Other (See Comments)    Only IV - only FERRLICET  . Mushroom Extract Complex Anaphylaxis  . Na Ferric Gluc Cplx In Sucrose Anaphylaxis  . Cymbalta [Duloxetine Hcl] Swelling and Anxiety  . Hydromorphone Other (See Comments)    BP drop and heart rate drops 5.1.20 PT REPORTS THAT SHE TAKES DILAUDID AT HOME  . Ondansetron Hcl Other (See Comments)  . Promethazine Other (See Comments)    Other reaction(s): Unknown  . Succinylcholine Other (See Comments)    Lock Jaw  . Buprenorphine Hcl Hives  . Compazine Other  (See Comments)    Altered mental status Aggression  . Duloxetine     Other reaction(s): Not available  . Metoclopramide Other (See Comments)    Dystonia  . Morphine And Codeine Hives  . Ondansetron Hives and Rash    Other reaction(s): Unknown Other reaction(s): Unknown  . Promethazine Hcl Hives  . Tegaderm Ag Mesh [Silver] Rash    Old formulation only, is able to tolerate new formulation    Family History  Family history unknown: Yes    Prior to Admission medications   Medication Sig Start Date End Date Taking? Authorizing Provider  albuterol (PROVENTIL) (2.5 MG/3ML) 0.083% nebulizer solution Take 2.5 mg by nebulization every 6 (six) hours as needed for wheezing or shortness of breath.   Yes [provider]  aspirin EC 81 MG tablet Take 1 tablet (81 mg total) by mouth daily. Swallow whole. Patient taking differently: Take 81 mg by mouth at bedtime. Chewable 07/16/22  Yes Nahser, Deloris Ping, MD  bumetanide (BUMEX) 1 MG tablet TAKE 1 TABLET BY MOUTH TWICE A DAY Patient taking differently: Take 1 mg by mouth 2 (two) times daily. 11/20/22  Yes Nahser, Deloris Ping, MD  buPROPion (WELLBUTRIN XL) 150 MG 24 hr tablet TAKE 1 TABLET BY MOUTH EVERY DAY Patient taking differently: Take 150 mg by mouth in the morning. Mid Morning 02/07/23  Yes Mozingo, Thereasa Solo, NP  clonazePAM (KLONOPIN) 1 MG tablet Take 1 tablet (1 mg total) by mouth 2 (two) times daily. 02/15/23  Yes Mozingo, Thereasa Solo, NP  dexlansoprazole (DEXILANT) 60 MG capsule Take 60 mg by mouth in the morning.   Yes [provider]  diphenhydrAMINE (BENADRYL) 12.5 MG/5ML elixir Take 10 mLs (25 mg total) by mouth every 6 (six) hours as needed (nausea). 09/17/22  Yes Rolly Salter, MD  EPINEPHRINE 0.3 mg/0.3 mL IJ SOAJ injection INJECT 0.3 MG INTO THE MUSCLE AS NEEDED FOR ANAPHYLAXIS. 01/21/23  Yes Luciano Cutter, MD  escitalopram (LEXAPRO) 20 MG tablet TAKE 1 TABLET BY MOUTH EVERY DAY IN THE EVENING Patient  taking differently: Take 20 mg by mouth daily in the afternoon. Afternoon 03/08/23  Yes Mozingo, Thereasa Solo, NP  famotidine (PEPCID) 40 MG/5ML suspension Take 2.5 mLs (20 mg total) by mouth daily. 03/30/23 04/29/23 Yes Curatolo, Adam, DO  hydrocortisone (CORTEF) 10 MG tablet Take 1-2 tablets (10-20 mg  total) by mouth See admin instructions. Take 20 mg in the am and 10mg  in the evening. Take after completing prednisone Patient taking differently: Take 10-20 mg by mouth See admin instructions. Take 20 mg in the am at 10 am and 10 mg in the afternoon. Take after completing prednisone 09/29/22  Yes Rolly Salter, MD  hydrOXYzine (ATARAX) 25 MG tablet Take 1 tablet (25 mg total) by mouth every 6 (six) hours as needed for anxiety. 01/24/23  Yes Azucena Fallen, MD  JANUVIA 100 MG tablet Take 100 mg by mouth daily. 04/11/23  Yes [provider]  lamoTRIgine (LAMICTAL) 200 MG tablet TAKE 1 TABLET BY MOUTH EVERYDAY AT BEDTIME Patient taking differently: Take 200 mg by mouth at bedtime. 12/24/22  Yes Mozingo, Thereasa Solo, NP  lamoTRIgine (LAMICTAL) 25 MG tablet Take 1 tablet (25 mg total) by mouth daily. Patient taking differently: Take 25 mg by mouth every morning. Mid Morning 03/04/23  Yes Mozingo, Thereasa Solo, NP  LORazepam (ATIVAN) 0.5 MG tablet Take 1 tablet (0.5 mg total) by mouth 3 (three) times daily as needed for anxiety. Patient taking differently: Place 0.5 mg into feeding tube 3 (three) times daily as needed for anxiety. nausea 02/11/23  Yes Mozingo, Thereasa Solo, NP  nitroGLYCERIN (NITROSTAT) 0.4 MG SL tablet Dissolve 1 tablet under the tongue every 5 minutes as needed for chest pain. Max of 3 doses, then 911. 08/20/22  Yes Nahser, Deloris Ping, MD  pantoprazole (PROTONIX) 40 MG tablet Take 40 mg by mouth in the morning. 04/18/23  Yes [provider]  rosuvastatin (CRESTOR) 10 MG tablet Take 10 mg by mouth in the morning. Mid morning 02/28/18  Yes [provider]   sacubitril-valsartan (ENTRESTO) 24-26 MG Take 1 tablet by mouth 2 (two) times daily. Patient taking differently: Take 1 tablet by mouth daily. Afternoon 09/22/22  Yes Rolly Salter, MD  sucralfate (CARAFATE) 1 GM/10ML suspension Take 1 g by mouth daily as needed (as directed for ulcers).   Yes [provider]  SYNTHROID 100 MCG tablet Take 1 tablet (100 mcg total) by mouth every morning. Patient taking differently: Take 100 mcg by mouth every morning. * Must be name brand* 10/19/21  Yes Serena Croissant, MD  Tiotropium Bromide Monohydrate (SPIRIVA RESPIMAT) 1.25 MCG/ACT AERS Inhale 2 puffs into the lungs daily. 08/15/22  Yes Luciano Cutter, MD  topiramate (TOPAMAX) 50 MG tablet Take 150 mg by mouth at bedtime.  12/25/19  Yes [provider]  zolpidem (AMBIEN CR) 12.5 MG CR tablet Take 12.5 mg by mouth at bedtime. 03/05/22  Yes [provider]  albuterol (PROVENTIL) (2.5 MG/3ML) 0.083% nebulizer solution Take 3 mLs (2.5 mg total) by nebulization every 6 (six) hours as needed for wheezing or shortness of breath. 07/09/22 03/09/23  Pokhrel, Rebekah Chesterfield, MD  Bismuth Tribromoph-Petrolatum (XEROFORM OCCLUSIVE GAUZE STRIP) PADS Apply 1 each topically as directed. 09/17/22   Rolly Salter, MD  dronabinol (MARINOL) 2.5 MG capsule TAKE 1 CAPSULE BY MOUTH TWICE A DAY AS NEEDED NAUSEA Patient taking differently: Take 2.5 mg by mouth at bedtime. 09/10/22   Serena Croissant, MD  EMGALITY 120 MG/ML SOSY Inject 120 mg into the skin every 28 (twenty-eight) days.    [provider]  FLORASTOR 250 MG capsule Take 250 mg by mouth 2 (two) times daily.    [provider]  fluticasone-salmeterol (ADVAIR HFA) 230-21 MCG/ACT inhaler INHALE 2 PUFFS INTO THE LUNGS TWICE A DAY 04/16/23   Luciano Cutter, MD  guaiFENesin (ROBITUSSIN) 100 MG/5ML liquid Take 15 mLs by mouth every 6 (six) hours as needed for cough or to loosen phlegm. 07/09/22   Pokhrel, Rebekah Chesterfield, MD  Heparin Na, Pork, Lock Flsh PF  (BD HEPARIN POSIFLUSH) 100 UNIT/ML SOLN USE 5 MLS IN PORT A CATH ONCE DAILY AFTER MEDICATION ADMINISTRATION AS A HEPLOCK AS DIRECTED. 03/05/23     HYDROmorphone (DILAUDID) 4 MG tablet Take 1 tablet (4 mg total) by mouth every 4 (four) hours as needed. Patient taking differently: Take 4 mg by mouth every 4 (four) hours as needed for moderate pain. 12/28/22     Lancets (ONETOUCH DELICA PLUS LANCET33G) MISC 3 (three) times daily. for testing 06/12/21   [provider]  menthol-cetylpyridinium (CEPACOL) 3 MG lozenge Take 1 lozenge (3 mg total) by mouth as needed for sore throat. 09/17/22   Rolly Salter, MD  [facility] VERIO test strip 3 (three) times daily. for testing 06/12/21   [provider]  oxyCODONE (OXY IR/ROXICODONE) 5 MG immediate release tablet Take 1 tablet (5 mg total) by mouth every 4 (four) hours as needed for pain 04/05/23     oxyCODONE (ROXICODONE) 5 MG/5ML solution Take 5 mLs (5 mg total) by mouth every 4 (four) hours as needed for severe pain. 03/30/23   Curatolo, Adam, DO  OXYGEN Inhale 4-5 L/min into the lungs continuous.    [provider]  PROAIR HFA 108 [phone] Base) MCG/ACT inhaler Inhale 2 puffs into the lungs every 4 (four) hours as needed for wheezing or shortness of breath.    [provider]  QVAR REDIHALER 80 MCG/ACT inhaler INHALE 1 PUFF INTO THE LUNGS TWICE A DAY Patient taking differently: Inhale 1 puff into the lungs 2 (two) times daily. 01/21/23   Luciano Cutter, MD  Rimegepant Sulfate (NURTEC) 75 MG TBDP Take 75 mg by mouth daily as needed (Migraine).     [provider]  silver sulfADIAZINE (SILVADENE) 1 % cream Apply to affected area daily Patient taking differently: Apply 1 Application topically daily as needed. 09/17/22 09/17/23  Rolly Salter, MD  Sodium Chloride Flush (NORMAL SALINE FLUSH) 0.9 % SOLN Use as directed for central venous catheter maintenance 03/05/23     Sodium Chloride Flush (NORMAL SALINE FLUSH) 0.9 % SOLN Use  as directed for central venous catheter maintenance 01/31/23       Physical Exam: Vitals:   04/22/23 1618 04/22/23 1619 04/22/23 2013 04/22/23 2015  BP:  102/62 100/64 101/63  Pulse:  92 93   Resp:  (!) 24 17   Temp: 98.2 F (36.8 C)  98.2 F (36.8 C)   TempSrc: Oral     SpO2:  100% 100%   Weight:    77.2 kg  Height:    5\' 5"  (1.651 m)    Physical Exam Vitals reviewed.  Constitutional:      General: She is not in acute distress. HENT:     Head: Normocephalic and atraumatic.  Eyes:     Extraocular Movements: Extraocular movements intact.  Cardiovascular:     Rate and Rhythm: Normal rate and regular rhythm.     Pulses: Normal pulses.     Heart sounds: Murmur heard.  Pulmonary:     Effort: Pulmonary effort is normal. No respiratory distress.     Breath sounds: Normal breath sounds. No wheezing or rales.  Abdominal:     General: Bowel sounds are normal. There is no distension.     Palpations: Abdomen is soft.  Tenderness: There is no abdominal tenderness.  Musculoskeletal:     Cervical back: Normal range of motion.     Right lower leg: No edema.     Left lower leg: No edema.     Comments: No obvious signs of infection at right IJ central venous catheter site.  Skin:    General: Skin is warm and dry.  Neurological:     General: No focal deficit present.     Mental Status: She is alert and oriented to person, place, and time.     Labs on Admission: I have personally reviewed following labs and imaging studies  CBC: Recent Labs  Lab 04/22/23 1735  WBC 23.1*  NEUTROABS 19.7*  HGB 9.6*  HCT 31.6*  MCV 79.8*  PLT 447*   Basic Metabolic Panel: Recent Labs  Lab 04/22/23 1735  NA 133*  K 3.8  CL 99  CO2 22  GLUCOSE 169*  BUN 33*  CREATININE 1.62*  CALCIUM 8.8*   GFR: Estimated Creatinine Clearance: 41.7 mL/min (A) (by C-G formula based on SCr of 1.62 mg/dL (H)). Liver Function Tests: Recent Labs  Lab 04/22/23 1735  AST 22  ALT 32  ALKPHOS 63   BILITOT 0.5  PROT 7.9  ALBUMIN 3.9   Recent Labs  Lab 04/22/23 1735  LIPASE 36   No results for input(s): "AMMONIA" in the last 168 hours. Coagulation Profile: No results for input(s): "INR", "PROTIME" in the last 168 hours. Cardiac Enzymes: No results for input(s): "CKTOTAL", "CKMB", "CKMBINDEX", "TROPONINI" in the last 168 hours. BNP (last 3 results) No results for input(s): "PROBNP" in the last 8760 hours. HbA1C: No results for input(s): "HGBA1C" in the last 72 hours. CBG: No results for input(s): "GLUCAP" in the last 168 hours. Lipid Profile: No results for input(s): "CHOL", "HDL", "LDLCALC", "TRIG", "CHOLHDL", "LDLDIRECT" in the last 72 hours. Thyroid Function Tests: No results for input(s): "TSH", "T4TOTAL", "FREET4", "T3FREE", "THYROIDAB" in the last 72 hours. Anemia Panel: No results for input(s): "VITAMINB12", "FOLATE", "FERRITIN", "TIBC", "IRON", "RETICCTPCT" in the last 72 hours. Urine analysis:    Component Value Date/Time   COLORURINE YELLOW 04/22/2023 1752   APPEARANCEUR CLEAR 04/22/2023 1752   LABSPEC 1.014 04/22/2023 1752   PHURINE 5.0 04/22/2023 1752   GLUCOSEU NEGATIVE 04/22/2023 1752   HGBUR NEGATIVE 04/22/2023 1752   BILIRUBINUR NEGATIVE 04/22/2023 1752   KETONESUR NEGATIVE 04/22/2023 1752   PROTEINUR NEGATIVE 04/22/2023 1752   UROBILINOGEN 1.0 06/16/2015 1045   NITRITE NEGATIVE 04/22/2023 1752   LEUKOCYTESUR NEGATIVE 04/22/2023 1752    Radiological Exams on Admission: DG Chest Portable 1 View  Result Date: 04/22/2023 CLINICAL DATA:  Weakness. EXAM: PORTABLE CHEST 1 VIEW COMPARISON:  March 30, 2023. FINDINGS: The heart size and mediastinal contours are within normal limits. Right internal jugular catheter is unchanged. Hypoinflation of the lungs is noted with mild bibasilar subsegmental atelectasis or infiltrate. The visualized skeletal structures are unremarkable. IMPRESSION: Hypoinflation of the lungs with mild bibasilar subsegmental atelectasis or  infiltrates. Electronically Signed   By: Lupita Raider M.D.   On: 04/22/2023 18:05    EKG: Independently reviewed.  Sinus rhythm, no significant change since previous tracing.  Assessment and Plan  Concern for adrenal crisis Patient with history of resection of pituitary adenoma resulting in chronic adrenal insufficiency.  She is taking hydrocortisone 20 mg in the morning and 10 mg in the afternoon at home and denies missing any doses.  She is presenting with generalized weakness/fatigue, nausea, vomiting, and hypotension.  Patient  was given IV hydrocortisone 100 mg in the ED.  Continue IV hydrocortisone 50 mg every 6 hours, IV fluid hydration, and check cortisol level.  Leukocytosis ?Pneumonia At risk for bacteremia given presence of right IJ central venous catheter.  She has chronic mild leukocytosis in the setting of steroid use but WBC count appears worse, now 23.1.  Patient reports temperature of 100.9 F at home but has been afebrile in the ED.  Slightly hypotensive with SBP currently around 100.  No tachycardia or lactic acidosis.  No signs of sepsis at this time.  UA not suggestive of infection.  Chest x-ray showing mild bibasilar subsegmental atelectasis or infiltrates.  She was treated for pneumonia last month.  Patient reports chronic cough and shortness of breath and feels that her respiratory status is at baseline.  She is chronically on 5 L home oxygen and no change in oxygen requirement from baseline.  SARS-CoV-2 PCR negative.  Respiratory viral panel pending.  She is at risk for bacteremia given presence of right IJ central venous catheter.  Continue vancomycin and cefepime per ID recommendations.  Blood cultures pending.  AKI on CKD stage II-IIIa Likely prerenal azotemia from dehydration in the setting of poor p.o. intake.  BUN 33, creatinine 1.6 (baseline 1.0-1.2).  Continue IV fluid hydration and monitor renal function.  Avoid nephrotoxic agents/hold home Bumex and  Entresto.  Chronic anemia Hemoglobin at baseline and no signs of bleeding.  Continue to monitor labs.  Mild hyponatremia Likely due to poor p.o. intake.  Continue IV fluid hydration with normal saline and monitor labs.  Chronic hypoxemic respiratory failure on 4-5 L home oxygen No change in oxygen requirement from baseline.  Continue supplemental oxygen.  Asthma/chronic bronchitis No wheezing.  Continue home inhalers.  Chronic CHF Mild aortic valve insufficiency Echo done in October 2023 showing EF 60 to 65%, grade 1 diastolic dysfunction, mild AVR, and mild AVS. No signs of volume overload, BNP normal.  Hold Bumex and Entresto at this time.  Hypothyroidism Continue Synthroid and check TSH.  Anxiety/mood disorder Continue home medications.  GERD Continue home medications.  Hyperlipidemia Continue statin.  Type 2 diabetes A1c 6.3 in May 2023, will repeat.  Sensitive sliding scale insulin ACHS.  Hypertension Hold antihypertensives at this time given concern for adrenal crisis/low blood pressure.  DVT prophylaxis: Lovenox Code Status: DNR (discussed with the patient) Family Communication: No family available at this time. Level of care: Progressive Care Unit Admission status: It is my clinical opinion that referral for OBSERVATION is reasonable and necessary in this patient based on the above information provided. The aforementioned taken together are felt to place the patient at high risk for further clinical deterioration. However, it is anticipated that the patient may be medically stable for discharge from the hospital within 24 to 48 hours.  Time Spent: 75+ minutes***  Susan Giovanni MD [facility]  If 7PM-7AM, please contact night-coverage [email or web address]  04/22/2023, 10:02 PM

## 2023-04-23 ENCOUNTER — Other Ambulatory Visit (HOSPITAL_COMMUNITY): Payer: Self-pay

## 2023-04-23 DIAGNOSIS — A419 Sepsis, unspecified organism: Secondary | ICD-10-CM | POA: Diagnosis present

## 2023-04-23 DIAGNOSIS — T80219A Unspecified infection due to central venous catheter, initial encounter: Secondary | ICD-10-CM | POA: Diagnosis present

## 2023-04-23 DIAGNOSIS — E890 Postprocedural hypothyroidism: Secondary | ICD-10-CM | POA: Diagnosis present

## 2023-04-23 DIAGNOSIS — Z1152 Encounter for screening for COVID-19: Secondary | ICD-10-CM | POA: Diagnosis not present

## 2023-04-23 DIAGNOSIS — Z66 Do not resuscitate: Secondary | ICD-10-CM | POA: Diagnosis present

## 2023-04-23 DIAGNOSIS — I2729 Other secondary pulmonary hypertension: Secondary | ICD-10-CM | POA: Diagnosis present

## 2023-04-23 DIAGNOSIS — D72829 Elevated white blood cell count, unspecified: Secondary | ICD-10-CM

## 2023-04-23 DIAGNOSIS — D631 Anemia in chronic kidney disease: Secondary | ICD-10-CM | POA: Diagnosis present

## 2023-04-23 DIAGNOSIS — J9611 Chronic respiratory failure with hypoxia: Secondary | ICD-10-CM | POA: Diagnosis present

## 2023-04-23 DIAGNOSIS — E272 Addisonian crisis: Secondary | ICD-10-CM | POA: Diagnosis not present

## 2023-04-23 DIAGNOSIS — J449 Chronic obstructive pulmonary disease, unspecified: Secondary | ICD-10-CM | POA: Diagnosis present

## 2023-04-23 DIAGNOSIS — I130 Hypertensive heart and chronic kidney disease with heart failure and stage 1 through stage 4 chronic kidney disease, or unspecified chronic kidney disease: Secondary | ICD-10-CM | POA: Diagnosis present

## 2023-04-23 DIAGNOSIS — Z515 Encounter for palliative care: Secondary | ICD-10-CM | POA: Diagnosis not present

## 2023-04-23 DIAGNOSIS — E871 Hypo-osmolality and hyponatremia: Secondary | ICD-10-CM | POA: Diagnosis present

## 2023-04-23 DIAGNOSIS — J189 Pneumonia, unspecified organism: Secondary | ICD-10-CM | POA: Diagnosis not present

## 2023-04-23 DIAGNOSIS — F32A Depression, unspecified: Secondary | ICD-10-CM | POA: Diagnosis present

## 2023-04-23 DIAGNOSIS — Y848 Other medical procedures as the cause of abnormal reaction of the patient, or of later complication, without mention of misadventure at the time of the procedure: Secondary | ICD-10-CM | POA: Diagnosis present

## 2023-04-23 DIAGNOSIS — E271 Primary adrenocortical insufficiency: Secondary | ICD-10-CM | POA: Diagnosis present

## 2023-04-23 DIAGNOSIS — I5082 Biventricular heart failure: Secondary | ICD-10-CM | POA: Diagnosis present

## 2023-04-23 DIAGNOSIS — I38 Endocarditis, valve unspecified: Secondary | ICD-10-CM | POA: Diagnosis not present

## 2023-04-23 DIAGNOSIS — E860 Dehydration: Secondary | ICD-10-CM | POA: Diagnosis present

## 2023-04-23 DIAGNOSIS — J8283 Eosinophilic asthma: Secondary | ICD-10-CM | POA: Diagnosis present

## 2023-04-23 DIAGNOSIS — I428 Other cardiomyopathies: Secondary | ICD-10-CM | POA: Diagnosis present

## 2023-04-23 DIAGNOSIS — R7881 Bacteremia: Secondary | ICD-10-CM | POA: Diagnosis present

## 2023-04-23 DIAGNOSIS — I5032 Chronic diastolic (congestive) heart failure: Secondary | ICD-10-CM | POA: Diagnosis present

## 2023-04-23 DIAGNOSIS — N1831 Chronic kidney disease, stage 3a: Secondary | ICD-10-CM | POA: Diagnosis present

## 2023-04-23 DIAGNOSIS — N179 Acute kidney failure, unspecified: Secondary | ICD-10-CM

## 2023-04-23 DIAGNOSIS — E785 Hyperlipidemia, unspecified: Secondary | ICD-10-CM | POA: Diagnosis present

## 2023-04-23 DIAGNOSIS — E1122 Type 2 diabetes mellitus with diabetic chronic kidney disease: Secondary | ICD-10-CM | POA: Diagnosis present

## 2023-04-23 DIAGNOSIS — R112 Nausea with vomiting, unspecified: Secondary | ICD-10-CM | POA: Diagnosis not present

## 2023-04-23 LAB — TSH: TSH: 0.508 u[IU]/mL (ref 0.350–4.500)

## 2023-04-23 LAB — BASIC METABOLIC PANEL
Anion gap: 10 (ref 5–15)
BUN: 30 mg/dL — ABNORMAL HIGH (ref 6–20)
CO2: 21 mmol/L — ABNORMAL LOW (ref 22–32)
Calcium: 8.4 mg/dL — ABNORMAL LOW (ref 8.9–10.3)
Chloride: 105 mmol/L (ref 98–111)
Creatinine, Ser: 1.24 mg/dL — ABNORMAL HIGH (ref 0.44–1.00)
GFR, Estimated: 52 mL/min — ABNORMAL LOW (ref >60)
Glucose, Bld: 147 mg/dL — ABNORMAL HIGH (ref 70–99)
Potassium: 3.2 mmol/L — ABNORMAL LOW (ref 3.5–5.1)
Sodium: 136 mmol/L (ref 135–145)

## 2023-04-23 LAB — CBC
HCT: 28.9 % — ABNORMAL LOW (ref 36.0–46.0)
Hemoglobin: 8.7 g/dL — ABNORMAL LOW (ref 12.0–15.0)
MCH: 23.9 pg — ABNORMAL LOW (ref 26.0–34.0)
MCHC: 30.1 g/dL (ref 30.0–36.0)
MCV: 79.4 fL — ABNORMAL LOW (ref 80.0–100.0)
Platelets: 382 10*3/uL (ref 150–400)
RBC: 3.64 MIL/uL — ABNORMAL LOW (ref 3.87–5.11)
RDW: 17.1 % — ABNORMAL HIGH (ref 11.5–15.5)
WBC: 17.2 10*3/uL — ABNORMAL HIGH (ref 4.0–10.5)
nRBC: 0 % (ref 0.0–0.2)

## 2023-04-23 LAB — HIV ANTIBODY (ROUTINE TESTING W REFLEX): HIV Screen 4th Generation wRfx: NONREACTIVE

## 2023-04-23 LAB — CORTISOL: Cortisol, Plasma: 52.5 ug/dL

## 2023-04-23 LAB — CULTURE, BLOOD (ROUTINE X 2)

## 2023-04-23 LAB — PROCALCITONIN: Procalcitonin: 1.67 ng/mL

## 2023-04-23 LAB — HEMOGLOBIN A1C
Hgb A1c MFr Bld: 6.4 % — ABNORMAL HIGH (ref 4.8–5.6)
Mean Plasma Glucose: 136.98 mg/dL

## 2023-04-23 LAB — GLUCOSE, CAPILLARY
Glucose-Capillary: 164 mg/dL — ABNORMAL HIGH (ref 70–99)
Glucose-Capillary: 145 mg/dL — ABNORMAL HIGH (ref 70–99)
Glucose-Capillary: 157 mg/dL — ABNORMAL HIGH (ref 70–99)

## 2023-04-23 MED ORDER — LORAZEPAM 2 MG/ML IJ SOLN: 1.0000 mg | Freq: Four times a day (QID) | INTRAMUSCULAR | Status: DC | PRN

## 2023-04-23 MED ORDER — OXYCODONE HCL 5 MG PO TABS
10.0000 mg | ORAL_TABLET | Freq: Four times a day (QID) | ORAL | Status: DC | PRN
  Administered 2023-04-24 – 2023-04-25 (×5): 10 mg via ORAL
  Filled 2023-04-23 (×5): qty 2

## 2023-04-23 MED ORDER — SODIUM CHLORIDE 0.9% FLUSH
10.0000 mL | INTRAVENOUS | Status: DC | PRN
  Administered 2023-04-23: 20 mL

## 2023-04-23 MED ORDER — CLONAZEPAM 1 MG PO TABS
1.0000 mg | ORAL_TABLET | Freq: Two times a day (BID) | ORAL | Status: DC
  Administered 2023-04-23 – 2023-04-25 (×5): 1 mg via ORAL
  Filled 2023-04-23 (×5): qty 1

## 2023-04-23 MED ORDER — DIPHENHYDRAMINE HCL 50 MG/ML IJ SOLN
12.5000 mg | Freq: Four times a day (QID) | INTRAMUSCULAR | Status: DC | PRN
  Administered 2023-04-23 – 2023-04-25 (×10): 12.5 mg via INTRAVENOUS
  Filled 2023-04-23 (×10): qty 1

## 2023-04-23 MED ORDER — ZOLPIDEM TARTRATE 5 MG PO TABS
5.0000 mg | ORAL_TABLET | Freq: Every evening | ORAL | Status: DC | PRN
  Administered 2023-04-23 – 2023-04-24 (×2): 5 mg via ORAL
  Filled 2023-04-23 (×2): qty 1

## 2023-04-23 MED ORDER — POTASSIUM CHLORIDE CRYS ER 20 MEQ PO TBCR
40.0000 meq | EXTENDED_RELEASE_TABLET | ORAL | Status: AC
  Administered 2023-04-23 (×2): 40 meq via ORAL
  Filled 2023-04-23 (×2): qty 2

## 2023-04-23 MED ORDER — DIPHENHYDRAMINE HCL 50 MG/ML IJ SOLN
12.5000 mg | Freq: Once | INTRAMUSCULAR | Status: AC
  Administered 2023-04-23: 12.5 mg via INTRAVENOUS
  Filled 2023-04-23: qty 1

## 2023-04-23 MED ORDER — ALBUTEROL SULFATE (2.5 MG/3ML) 0.083% IN NEBU: 3.0000 mL | INHALATION_SOLUTION | RESPIRATORY_TRACT | Status: DC | PRN

## 2023-04-23 MED ORDER — ASPIRIN 81 MG PO TBEC
81.0000 mg | DELAYED_RELEASE_TABLET | Freq: Every day | ORAL | Status: DC
  Administered 2023-04-23 – 2023-04-25 (×3): 81 mg via ORAL
  Filled 2023-04-23 (×3): qty 1

## 2023-04-23 MED ORDER — CHLORHEXIDINE GLUCONATE CLOTH 2 % EX PADS
6.0000 | MEDICATED_PAD | Freq: Every day | CUTANEOUS | Status: DC
  Administered 2023-04-23 – 2023-04-25 (×3): 6 via TOPICAL

## 2023-04-23 MED ORDER — HYDROCORTISONE SOD SUC (PF) 100 MG IJ SOLR
50.0000 mg | Freq: Four times a day (QID) | INTRAMUSCULAR | Status: AC
  Administered 2023-04-23 (×3): 50 mg via INTRAVENOUS
  Filled 2023-04-23 (×3): qty 1

## 2023-04-23 MED ORDER — OXYCODONE HCL 5 MG PO TABS: 5.0000 mg | ORAL_TABLET | ORAL | Status: DC | PRN

## 2023-04-23 MED ORDER — SODIUM CHLORIDE 0.9% FLUSH
10.0000 mL | Freq: Two times a day (BID) | INTRAVENOUS | Status: DC
  Administered 2023-04-23 – 2023-04-25 (×5): 10 mL

## 2023-04-23 NOTE — Progress Notes (Signed)
[employer] engineer, contracting [facility]) Hospital Liaison Note  This patient is currently enrolled in [facility] outpatient-based palliative care.   [facility] will continue to follow for discharge disposition.   Please call for any outpatient based palliative care related questions or concerns.   Thank you.   Lynder Parents [facility] Liaison [phone] [email or web address]

## 2023-04-23 NOTE — Progress Notes (Signed)
PROGRESS NOTE    [name]  ZOX:[id number] DOB: [birth year] DOA: 04/22/2023 PCP: Audery Amel, MD   Brief Narrative:  [name] is a 52 y.o. 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  She was admitted last month 6/8-6/12 for acute on chronic respiratory failure due to pneumonia.  Patient follows with palliative care and hospice.   Patient presents to the ED today complaining of vomiting and generalized weakness.  She reported fever and hypotension at home. She has a central line in place.  In the ED, patient was afebrile but blood pressure slightly low with systolic around 100.  No change in oxygen requirement from baseline.  Labs showing WBC 23.1, hemoglobin 9.6 (at baseline), platelet count 447k, sodium 133, glucose 169, BUN 33, creatinine 1.6 (baseline 1.0-1.2), normal lipase and LFTs, BNP normal, lactic acid 1.7, SARS-CoV-2 PCR negative, respiratory viral panel negative, blood cultures collected.  Chest x-ray showing mild bibasilar subsegmental atelectasis or infiltrates.  ED physician discussed with Dr. Renold Don with ID who recommended starting the patient on vancomycin and cefepime.  Patient was also given IV hydrocortisone 100 mg and 500 mL normal saline in the ED.  Assessment & Plan:   Principal Problem:   Adrenal crisis (HCC) Active Problems:   Anemia   GERD (gastroesophageal reflux disease)   Anxiety and  depression   Leukocytosis   AKI (acute kidney injury) (HCC)  Possible adrenal crisis Patient with history of resection of pituitary adenoma resulting in chronic adrenal insufficiency.  She is taking hydrocortisone 20 mg in the morning and 10 mg in the afternoon at home and denies missing any doses.  She is presenting with generalized weakness/fatigue, nausea, vomiting, mild hyponatremia, and White hypotension.  She was given IV hydrocortisone 100 mg in the ED.  Continue IV hydrocortisone 50 mg every 6 hours for total of 4 doses and IV fluid hydration.  Check cortisol level and monitor sodium, potassium, and glucose.   Sepsis secondary to hospital-acquired pneumonia, POA, chronic hypoxic respiratory failure: Patient meets criteria for sepsis based on evidence of pneumonia, tachycardia, tachypnea and leukocytosis.  She has elevated procalcitonin as well and recent hospitalization.  Patient uses 4 to 5 L of oxygen at home and she is currently at her baseline.  Rule out of COVID as well as respiratory viral panel.  Continue cefepime and vancomycin, ID to follow as well.  Leukocytosis   AKI on CKD stage II-IIIa Likely prerenal azotemia from dehydration in the setting of poor p.o. intake.  BUN 33, creatinine 1.6 (baseline 1.0-1.2).  Continue IV fluid hydration and monitor renal function.  Creatinine has improved and back to baseline now.  Avoid nephrotoxic agents/hold home Bumex and Entresto.   Chronic anemia Hemoglobin at baseline and no signs of bleeding.  Continue to monitor labs.   Asthma/chronic bronchitis No wheezing.  Continue home inhalers.   Chronic CHF Mild aortic valve insufficiency Echo done in October 2023 showing EF 60 to 65%, grade 1 diastolic dysfunction, mild AVR, and mild AVS. No signs of volume overload, BNP normal.  Hold Bumex  and Entresto given AKI.   Hypothyroidism Continue Synthroid and check TSH.   Anxiety/depression Continue home medications.   GERD Continue home  medications.   Hyperlipidemia Continue statin.   Type 2 diabetes A1c 6.3 in May 2023, will repeat.  Sensitive sliding scale insulin.   Hypertension Hold antihypertensives at this time given concern for possible adrenal crisis.  Hypokalemia: Replace.  DVT prophylaxis: enoxaparin (LOVENOX) injection 40 mg Start: 04/23/23 1000   Code Status: DNR  Family Communication:  None present at bedside.  Plan of care discussed with patient in length and he/she verbalized understanding and agreed with it.  Status is: Observation The patient will require care spanning > 2 midnights and should be moved to inpatient because: Still symptomatic   Estimated body mass index is 28.34 kg/m as calculated from the following:   Height as of this encounter: 5\' 5"  (1.651 m).   Weight as of this encounter: 77.2 kg.    Nutritional Assessment: Body mass index is 28.34 kg/m.Marland Kitchen Seen by dietician.  I agree with the assessment and plan as outlined below: Nutrition Status:        . Skin Assessment: I have examined the patient's skin and I agree with the wound assessment as performed by the wound care RN as outlined below:    Consultants:  None  Procedures:  None  Antimicrobials:  Anti-infectives (From admission, onward)    Start     Dose/Rate Route Frequency Ordered Stop   04/24/23 1000  vancomycin (VANCOREADY) IVPB 1500 mg/300 mL        1,500 mg 150 mL/hr over 120 Minutes Intravenous Every 36 hours 04/22/23 2353     04/23/23 1000  ceFEPIme (MAXIPIME) 2 g in sodium chloride 0.9 % 100 mL IVPB        2 g 200 mL/hr over 30 Minutes Intravenous Every 12 hours 04/22/23 2353     04/22/23 2100  vancomycin (VANCOREADY) IVPB 1750 mg/350 mL        1,750 mg 175 mL/hr over 120 Minutes Intravenous STAT 04/22/23 2055 04/23/23 0015   04/22/23 2045  ceFEPIme (MAXIPIME) 2 g in sodium chloride 0.9 % 100 mL IVPB        2 g 200 mL/hr over 30 Minutes Intravenous STAT 04/22/23 2038 04/22/23 2154          Subjective: Patient seen and examined.  Still complains of nausea, anxiety and generalized weakness.  She is requesting Benadryl as well as Ativan to help with his nausea and anxiety.  Objective: Vitals:   04/23/23 0015 04/23/23 0110 04/23/23 0439 04/23/23 0728  BP: 110/72 105/63 115/71   Pulse: (!) 106 96 99 95  Resp:  16 16   Temp:  98.1 F (36.7 C) 97.7 F (36.5 C)   TempSrc:  Oral Oral   SpO2: 99% 100% 99% 98%  Weight:      Height:        Intake/Output Summary (Last 24 hours) at 04/23/2023 0737 Last data filed at 04/23/2023 0500 Gross per 24 hour  Intake 607.69 ml  Output 100 ml  Net 507.69 ml   Filed Weights   04/22/23 2015  Weight: 77.2 kg    Examination:  General exam: Appears calm and comfortable  Respiratory system: Clear to auscultation. Respiratory effort normal. Cardiovascular system: S1 & S2 heard, RRR. No JVD, murmurs, rubs, gallops or clicks. No pedal edema. Gastrointestinal system: Abdomen is nondistended, soft and nontender. No organomegaly or masses felt. Normal bowel sounds heard. Central nervous system:  Alert and oriented. No focal neurological deficits. Extremities: Symmetric 5 x 5 power. Skin: No rashes, lesions or ulcers Psychiatry: Judgement and insight appear normal. Mood & affect appropriate.    Data Reviewed: I have personally reviewed following labs and imaging studies  CBC: Recent Labs  Lab 04/22/23 1735 04/23/23 0340  WBC 23.1* 17.2*  NEUTROABS 19.7*  --   HGB 9.6* 8.7*  HCT 31.6* 28.9*  MCV 79.8* 79.4*  PLT 447* 382   Basic Metabolic Panel: Recent Labs  Lab 04/22/23 1735 04/23/23 0340  NA 133* 136  K 3.8 3.2*  CL 99 105  CO2 22 21*  GLUCOSE 169* 147*  BUN 33* 30*  CREATININE 1.62* 1.24*  CALCIUM 8.8* 8.4*   GFR: Estimated Creatinine Clearance: 54.5 mL/min (A) (by C-G formula based on SCr of 1.24 mg/dL (H)). Liver Function Tests: Recent Labs  Lab 04/22/23 1735  AST 22  ALT 32  ALKPHOS 63  BILITOT 0.5   PROT 7.9  ALBUMIN 3.9   Recent Labs  Lab 04/22/23 1735  LIPASE 36   No results for input(s): "AMMONIA" in the last 168 hours. Coagulation Profile: No results for input(s): "INR", "PROTIME" in the last 168 hours. Cardiac Enzymes: No results for input(s): "CKTOTAL", "CKMB", "CKMBINDEX", "TROPONINI" in the last 168 hours. BNP (last 3 results) No results for input(s): "PROBNP" in the last 8760 hours. HbA1C: No results for input(s): "HGBA1C" in the last 72 hours. CBG: Recent Labs  Lab 04/23/23 0114  GLUCAP 164*   Lipid Profile: No results for input(s): "CHOL", "HDL", "LDLCALC", "TRIG", "CHOLHDL", "LDLDIRECT" in the last 72 hours. Thyroid Function Tests: Recent Labs    04/23/23 0340  TSH 0.508   Anemia Panel: No results for input(s): "VITAMINB12", "FOLATE", "FERRITIN", "TIBC", "IRON", "RETICCTPCT" in the last 72 hours. Sepsis Labs: Recent Labs  Lab 04/22/23 1735 04/23/23 0340  PROCALCITON  --  1.67  LATICACIDVEN 1.7  --     Recent Results (from the past 240 hour(s))  Respiratory (~20 pathogens) panel by PCR     Status: None   Collection Time: 04/22/23  8:26 PM   Specimen: Nasopharyngeal Swab; Respiratory  Result Value Ref Range Status   Adenovirus NOT DETECTED NOT DETECTED Final   Coronavirus 229E NOT DETECTED NOT DETECTED Final    Comment: (NOTE) The Coronavirus on the Respiratory Panel, DOES NOT test for the novel  Coronavirus (2019 nCoV)    Coronavirus HKU1 NOT DETECTED NOT DETECTED Final   Coronavirus NL63 NOT DETECTED NOT DETECTED Final   Coronavirus OC43 NOT DETECTED NOT DETECTED Final   Metapneumovirus NOT DETECTED NOT DETECTED Final   Rhinovirus / Enterovirus NOT DETECTED NOT DETECTED Final   Influenza A NOT DETECTED NOT DETECTED Final   Influenza B NOT DETECTED NOT DETECTED Final   Parainfluenza Virus 1 NOT DETECTED NOT DETECTED Final   Parainfluenza Virus 2 NOT DETECTED NOT DETECTED Final   Parainfluenza Virus 3 NOT DETECTED NOT DETECTED Final    Parainfluenza Virus 4 NOT DETECTED NOT DETECTED Final   Respiratory Syncytial Virus NOT DETECTED NOT DETECTED Final   Bordetella pertussis NOT DETECTED NOT DETECTED Final   Bordetella Parapertussis NOT DETECTED NOT DETECTED Final   Chlamydophila pneumoniae NOT DETECTED NOT DETECTED Final   Mycoplasma pneumoniae NOT DETECTED NOT DETECTED Final    Comment: Performed at [facility], [address], [place], [place] [address]  SARS Coronavirus 2 by RT PCR (hospital order, performed in [facility] hospital lab) *cepheid single result test* Nasopharyngeal Swab  Status: None   Collection Time: 04/22/23  8:26 PM   Specimen: Nasopharyngeal Swab; Nasal Swab  Result Value Ref Range Status   SARS Coronavirus 2 by RT PCR NEGATIVE NEGATIVE Final    Comment: (NOTE) SARS-CoV-2 target nucleic acids are NOT DETECTED.  The SARS-CoV-2 RNA is generally detectable in upper and lower respiratory specimens during the acute phase of infection. The lowest concentration of SARS-CoV-2 viral copies this assay can detect is 250 copies / mL. A negative result does not preclude SARS-CoV-2 infection and should not be used as the sole basis for treatment or other patient management decisions.  A negative result may occur with improper specimen collection / handling, submission of specimen other than nasopharyngeal swab, presence of viral mutation(s) within the areas targeted by this assay, and inadequate number of viral copies (<250 copies / mL). A negative result must be combined with clinical observations, patient history, and epidemiological information.  Fact Sheet for Patients:   RoadLapTop.co.za  Fact Sheet for Healthcare Providers: http://kim-miller.com/  This test is not yet approved or  cleared by the [place] FDA and has been authorized for detection and/or diagnosis of SARS-CoV-2 by FDA under an Emergency Use Authorization (EUA).  This EUA will  remain in effect (meaning this test can be used) for the duration of the COVID-19 declaration under Section 564(b)(1) of the Act, 21 U.S.C. section 360bbb-3(b)(1), unless the authorization is terminated or revoked sooner.  Performed at [facility], [address], [place], [place] [address]      Radiology Studies: DG Chest Portable 1 View  Result Date: 04/22/2023 CLINICAL DATA:  Weakness. EXAM: PORTABLE CHEST 1 VIEW COMPARISON:  March 30, 2023. FINDINGS: The heart size and mediastinal contours are within normal limits. Right internal jugular catheter is unchanged. Hypoinflation of the lungs is noted with mild bibasilar subsegmental atelectasis or infiltrate. The visualized skeletal structures are unremarkable. IMPRESSION: Hypoinflation of the lungs with mild bibasilar subsegmental atelectasis or infiltrates. Electronically Signed   By: Lupita Raider M.D.   On: 04/22/2023 18:05    Scheduled Meds:  buPROPion  150 mg Oral Daily   Chlorhexidine Gluconate Cloth  6 each Topical Daily   enoxaparin (LOVENOX) injection  40 mg Subcutaneous Q24H   escitalopram  20 mg Oral Q1500   famotidine  20 mg Oral Daily   hydrocortisone sodium succinate  50 mg Intravenous Q6H   insulin aspart  0-5 Units Subcutaneous QHS   insulin aspart  0-9 Units Subcutaneous TID WC   lamoTRIgine  200 mg Oral QHS   lamoTRIgine  25 mg Oral q morning   levothyroxine  100 mcg Oral Q0600   pantoprazole  40 mg Oral Daily   potassium chloride  40 mEq Oral Q4H   rosuvastatin  10 mg Oral Daily   sodium chloride flush  10-40 mL Intracatheter Q12H   topiramate  150 mg Oral QHS   umeclidinium bromide  1 puff Inhalation Daily   Continuous Infusions:  sodium chloride 125 mL/hr at 04/23/23 0148   ceFEPime (MAXIPIME) IV     [START ON 04/24/2023] vancomycin       LOS: 0 days   Hughie Closs, MD [facility]  04/23/2023, 7:37 AM   *Please note that this is a verbal dictation therefore any spelling or  grammatical errors are due to the "Dragon Medical One" system interpretation.  Please page via Amion and do not message via secure chat for urgent patient care matters. Secure chat can be used  for non urgent patient care matters.  How to contact the [facility] Attending or Consulting provider 7A - 7P or covering provider during after hours 7P -7A, for this patient?  Check the care team in [facility] and look for a) attending/consulting TRH provider listed and b) the [facility] team listed. Page or secure chat 7A-7P. Log into [email or web address] and use [place]'s universal password to access. If you do not have the password, please contact the hospital operator. Locate the [facility] provider you are looking for under [facility] and page to a number that you can be directly reached. If you still have difficulty reaching the provider, please page the [facility] (Director on Call) for the Hospitalists listed on amion for assistance.

## 2023-04-23 NOTE — TOC Initial Note (Signed)
Transition of Care [facility]) - Initial/Assessment Note    Patient Details  Name: [name] MRN: [id number] Date of Birth: 1971-01-16  Transition of Care [facility]) CM/SW Contact:    Lanier Clam, RN Phone Number: 04/23/2023, 12:45 PM  Clinical Narrative: d/c plan home. Active w/[facility]-palliative program,has home 02. Haw own transport home.                  Expected Discharge Plan: Home/Self Care Barriers to Discharge: Continued Medical Work up   Patient Goals and CMS Choice Patient states their goals for this hospitalization and ongoing recovery are:: Home CMS Medicare.gov Compare Post Acute Care list provided to:: Patient Choice offered to / list presented to : Patient [place] ownership interest in [facility].provided to:: Patient    Expected Discharge Plan and Services   Discharge Planning Services: CM Consult Post Acute Care Choice: Resumption of Svcs/PTA Provider Living arrangements for the past 2 months: Single Family Home                                      Prior Living Arrangements/Services Living arrangements for the past 2 months: Single Family Home Lives with:: Spouse Patient language and need for interpreter reviewed:: Yes Do you feel safe going back to the place where you live?: Yes      Need for Family Participation in Patient Care: Yes (Comment) Care giver support system in place?: Yes (comment) Current home services: DME (home 02,rw;Active [facility] palliative program) Criminal Activity/Legal Involvement Pertinent to Current Situation/Hospitalization: No - Comment as needed  Activities of Daily Living Home Assistive Devices/Equipment: Walker (specify type), Blood pressure cuff, Shower chair without back, Oxygen, Wheelchair ADL Screening (condition at time of admission) Patient's cognitive ability adequate to safely complete daily activities?: Yes Is the patient deaf or have difficulty hearing?: No Does the patient have  difficulty seeing, even when wearing glasses/contacts?: No Does the patient have difficulty concentrating, remembering, or making decisions?: No Patient able to express need for assistance with ADLs?: Yes Does the patient have difficulty dressing or bathing?: Yes Independently performs ADLs?: No Communication: Independent Dressing (OT): Needs assistance Is this a change from baseline?: Pre-admission baseline Grooming: Needs assistance Is this a change from baseline?: Pre-admission baseline Feeding: Appropriate for developmental age Bathing: Needs assistance Is this a change from baseline?: Pre-admission baseline Toileting: Appropriate for developmental age In/Out Bed: Needs assistance Is this a change from baseline?: Pre-admission baseline Walks in Home: Needs assistance Is this a change from baseline?: Pre-admission baseline Does the patient have difficulty walking or climbing stairs?: Yes Weakness of Legs: Both Weakness of Arms/Hands: Both  Permission Sought/Granted Permission sought to share information with : Case Manager Permission granted to share information with : Yes, Verbal Permission Granted  Share Information with NAME: Case Manager           Emotional Assessment Appearance:: Appears stated age Attitude/Demeanor/Rapport: Gracious Affect (typically observed): Accepting Orientation: : Oriented to Self, Oriented to Place, Oriented to  Time, Oriented to Situation Alcohol / Substance Use: Not Applicable Psych Involvement: No (comment)  Admission diagnosis:  Addisonian crisis (HCC) [E27.2] Adrenal crisis (HCC) [E27.2] Nausea and vomiting, unspecified vomiting type [R11.2] Patient Active Problem List   Diagnosis Date Noted   Leukocytosis 04/23/2023   AKI (acute kidney injury) (HCC) 04/23/2023   Adrenal crisis (HCC) 04/22/2023   Lobar pneumonia (HCC) 03/10/2023   Severe persistent asthma 03/09/2023  CKD stage 3a, GFR 45-59 ml/min (HCC) 03/09/2023   Goals of care,  counseling/discussion 01/22/2023   Anemia 01/22/2023   Cellulitis 09/13/2022   Grade I diastolic dysfunction 09/13/2022   Excessive daytime sleepiness 08/01/2022   Community acquired pneumonia    Severe persistent asthma with acute exacerbation    Anxiety    Anxiety and depression 04/06/2022   GERD without esophagitis 04/06/2022   Sepsis due to pneumonia (HCC) 04/06/2022   PICC (peripherally inserted central catheter) in place    Bacteremia due to Enterobacter species 03/29/2022   Normocytic anemia 03/29/2022   Major depressive disorder, recurrent episode, moderate (HCC)    Generalized anxiety disorder/depression    Seizure-like activity (HCC) 12/08/2020   High anion gap metabolic acidosis 12/08/2020   Hypokalemia 12/08/2020   Acute renal failure superimposed on stage 3a chronic kidney disease (HCC) 04/27/2020   Vasculopathy 04/27/2020   Bacteremia 04/19/2020   Addison's disease (HCC)    Chronic nausea 04/01/2020   Generalized weakness 02/10/2020   Headache 02/10/2020   Subcutaneous nodule 02/01/2020   Port-A-Cath in place 09/09/2019   Moderate persistent asthma without complication 08/09/2019   Fever 07/21/2019   Superficial thrombophlebitis 07/16/2019   Injury of left index finger 12/24/2018   History of pituitary adenoma 12/23/2018   History of cervical cancer 12/23/2018   Chronic respiratory failure with hypoxia (HCC) 12/23/2018   SOB (shortness of breath)    Nephrolithiasis 11/07/2018   Oxygen dependent 11/07/2018   Migraine 11/07/2018   PVC's (premature ventricular contractions) 10/27/2018   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 [place] [address] Phone: [phone] Fax: [phone]  [id number] - [place], [place] - [address] AT [facility] OF [address] [place] [address] Phone: [phone] Fax: [phone]     Social Determinants of Health (SDOH) Social History: SDOH Screenings   Food Insecurity: No Food Insecurity (04/23/2023)  Housing: Low Risk  (04/23/2023)  Transportation Needs: No Transportation Needs (04/23/2023)  Utilities: Not At Risk (04/23/2023)  Depression (PHQ2-9): Low Risk  (06/14/2022)  Financial Resource Strain: Medium Risk (08/21/2017)   [facility] from [facility], [facility]  Physical Activity: Unknown (08/21/2017)   Received from [facility], [facility]  Social Connections: Unknown (02/12/2022)   Received from [facility], [facility]  Stress: Stress Concern Present (08/21/2017)   [facility] from [facility], [facility]  Tobacco Use: Low Risk  (04/22/2023)   SDOH Interventions:     Readmission Risk Interventions    03/12/2023    9:36 PM 09/14/2022   12:19 PM 07/02/2022    1:15 PM  Readmission Risk Prevention Plan  Transportation Screening Complete Complete Complete  PCP or Specialist Appt within 5-7 Days   Complete  Home Care Screening   Complete  Medication Review (RN CM)   Complete  Medication Review (RN Care Manager) Complete Complete   PCP or Specialist appointment within 3-5 days of discharge Complete Complete   HRI  or Home Care Consult Complete Complete   SW Recovery Care/Counseling Consult Complete Complete   Palliative Care Screening Not Applicable Not Applicable   Skilled Nursing Facility Not Applicable Not Applicable

## 2023-04-23 NOTE — Plan of Care (Signed)
Monitor for anxiety and nausea; medicate as needed. Decreased mobility due to weakness; pt to evaluate. Speech eval for complaint of difficulty swallowing. Monitor oral intake, provide supplements as needed.

## 2023-04-23 NOTE — ED Notes (Signed)
Pt called out approx 15 mins ago asking for nurse when in to access pt states "Something is wrong, this is different". Unable to tell me what was wrong. ER MD in to speech with pt. Pt states she had a sudden rush feeling of anxiety and nausea. Dr Loney Loh paged to come assess pt

## 2023-04-23 NOTE — ED Notes (Signed)
Spoke with Dr Loney Loh, pt is at baseline. Feels as if pt was having increased anxiety. Pt is sitting on side of bed in stable condition at this time

## 2023-04-24 DIAGNOSIS — E272 Addisonian crisis: Secondary | ICD-10-CM | POA: Diagnosis not present

## 2023-04-24 LAB — BASIC METABOLIC PANEL
Anion gap: 6 (ref 5–15)
BUN: 21 mg/dL — ABNORMAL HIGH (ref 6–20)
CO2: 23 mmol/L (ref 22–32)
Calcium: 8.8 mg/dL — ABNORMAL LOW (ref 8.9–10.3)
Chloride: 111 mmol/L (ref 98–111)
Creatinine, Ser: 1.07 mg/dL — ABNORMAL HIGH (ref 0.44–1.00)
GFR, Estimated: >60 mL/min (ref >60)
Glucose, Bld: 133 mg/dL — ABNORMAL HIGH (ref 70–99)
Potassium: 4.3 mmol/L (ref 3.5–5.1)
Sodium: 140 mmol/L (ref 135–145)

## 2023-04-24 LAB — CBC WITH DIFFERENTIAL/PLATELET
Abs Immature Granulocytes: 0.06 10*3/uL (ref 0.00–0.07)
Basophils Absolute: 0 10*3/uL (ref 0.0–0.1)
Basophils Relative: 0 %
Eosinophils Absolute: 0 10*3/uL (ref 0.0–0.5)
Eosinophils Relative: 0 %
HCT: 28.9 % — ABNORMAL LOW (ref 36.0–46.0)
Hemoglobin: 8.4 g/dL — ABNORMAL LOW (ref 12.0–15.0)
Immature Granulocytes: 1 %
Lymphocytes Relative: 18 %
Lymphs Abs: 2.1 10*3/uL (ref 0.7–4.0)
MCH: 23.7 pg — ABNORMAL LOW (ref 26.0–34.0)
MCHC: 29.1 g/dL — ABNORMAL LOW (ref 30.0–36.0)
MCV: 81.6 fL (ref 80.0–100.0)
Monocytes Absolute: 0.6 10*3/uL (ref 0.1–1.0)
Monocytes Relative: 5 %
Neutro Abs: 8.7 10*3/uL — ABNORMAL HIGH (ref 1.7–7.7)
Neutrophils Relative %: 76 %
Platelets: 367 10*3/uL (ref 150–400)
RBC: 3.54 MIL/uL — ABNORMAL LOW (ref 3.87–5.11)
RDW: 17.2 % — ABNORMAL HIGH (ref 11.5–15.5)
WBC: 11.5 10*3/uL — ABNORMAL HIGH (ref 4.0–10.5)
nRBC: 0 % (ref 0.0–0.2)

## 2023-04-24 LAB — GLUCOSE, CAPILLARY
Glucose-Capillary: 109 mg/dL — ABNORMAL HIGH (ref 70–99)
Glucose-Capillary: 67 mg/dL — ABNORMAL LOW (ref 70–99)
Glucose-Capillary: 130 mg/dL — ABNORMAL HIGH (ref 70–99)
Glucose-Capillary: 143 mg/dL — ABNORMAL HIGH (ref 70–99)

## 2023-04-24 LAB — CULTURE, BLOOD (ROUTINE X 2)

## 2023-04-24 MED ORDER — SODIUM CHLORIDE 0.9 % IV SOLN
2.0000 g | Freq: Three times a day (TID) | INTRAVENOUS | Status: DC
  Administered 2023-04-24 – 2023-04-25 (×5): 2 g via INTRAVENOUS
  Filled 2023-04-24 (×5): qty 12.5

## 2023-04-24 MED ORDER — HYDROCORTISONE 10 MG PO TABS
20.0000 mg | ORAL_TABLET | Freq: Every day | ORAL | Status: DC
  Administered 2023-04-24 – 2023-04-25 (×2): 20 mg via ORAL
  Filled 2023-04-24 (×2): qty 2

## 2023-04-24 MED ORDER — VANCOMYCIN HCL 1500 MG/300ML IV SOLN
1500.0000 mg | INTRAVENOUS | Status: DC
  Administered 2023-04-24 – 2023-04-25 (×2): 1500 mg via INTRAVENOUS
  Filled 2023-04-24 (×2): qty 300

## 2023-04-24 MED ORDER — HYDROCORTISONE 10 MG PO TABS
10.0000 mg | ORAL_TABLET | Freq: Every day | ORAL | Status: DC
  Administered 2023-04-24 – 2023-04-25 (×2): 10 mg via ORAL
  Filled 2023-04-24 (×2): qty 1

## 2023-04-24 MED ORDER — LORAZEPAM 0.5 MG PO TABS
0.5000 mg | ORAL_TABLET | Freq: Four times a day (QID) | ORAL | Status: DC | PRN
  Administered 2023-04-25 (×2): 0.5 mg via ORAL
  Filled 2023-04-24 (×2): qty 1

## 2023-04-24 NOTE — Progress Notes (Signed)
Pharmacy Antibiotic Note  [name] is a 52 y.o. female admitted on 04/22/2023 with bacteremia.  Pharmacy has been consulted for Vanco, Cefepime dosing.  ID: Bacteremia (has a central line) - WBC 17.2>11.5, Scr down to 1 - LA 1.7, PC 1.67  Antimicrobials this admission: 7/22 vanc >> 7/22 cefepime >>   Dose adjustments this admission: 7/22: Vancomycin 1500mg  q36h (AUC 517.9, Scr 1.62) 7/24: Change Vancomycin 1500 mg/24 (AUC 503, Scr 1)  Microbiology results: 7/22 BCx: GP Rods in aerobic bottle only. 7/22 Resp: neg  Plan: Change Vancomycin 1500 mg IV Q 24 hrs. Goal AUC 400-550. Expected AUC: 503 SCr used: 1  Change Cefepime 2gm IV q8hr     Height: 5\' 5"  (165.1 cm) Weight: 77.2 kg (170 lb 4.8 oz) IBW/kg (Calculated) : 57  Temp (24hrs), Avg:98.2 F (36.8 C), Min:97.8 F (36.6 C), Max:98.8 F (37.1 C)  Recent Labs  Lab 04/22/23 1735 04/23/23 0340 04/24/23 0336  WBC 23.1* 17.2* 11.5*  CREATININE 1.62* 1.24* 1.07*  LATICACIDVEN 1.7  --   --     Estimated Creatinine Clearance: 63.2 mL/min (A) (by C-G formula based on SCr of 1.07 mg/dL (H)).    Allergies  Allergen Reactions   Ferrous Bisglycinate Chelate [Iron] Anaphylaxis and Other (See Comments)    Only IV - only FERRLICET   Mushroom Extract Complex Anaphylaxis   Na Ferric Gluc Cplx In Sucrose Anaphylaxis   Cymbalta [Duloxetine Hcl] Swelling and Anxiety   Hydromorphone Other (See Comments)    BP drop and heart rate drops 5.1.20 PT REPORTS THAT SHE TAKES DILAUDID AT HOME   Ondansetron Hcl Other (See Comments)   Promethazine Other (See Comments)    Other reaction(s): Unknown   Succinylcholine Other (See Comments)    Lock Jaw   Buprenorphine Hcl Hives   Compazine Other (See Comments)    Altered mental status Aggression   Duloxetine     Other reaction(s): Not available   Metoclopramide Other (See Comments)    Dystonia   Morphine And Codeine Hives   Ondansetron Hives and Rash    Other reaction(s):  Unknown Other reaction(s): Unknown   Promethazine Hcl Hives   Tegaderm Ag Mesh [Silver] Rash    Old formulation only, is able to tolerate new formulation    Karmela Bram S. Merilynn Finland, PharmD, BCPS Clinical Staff Pharmacist [email or web address]  Pasty Spillers 04/24/2023 7:34 AM

## 2023-04-24 NOTE — Progress Notes (Signed)
PROGRESS NOTE    [name]  JXB:[id number] DOB: [birth year] DOA: 04/22/2023 PCP: Audery Amel, MD   Brief Narrative:  [name] is a 52 y.o. 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  She was admitted last month 6/8-6/12 for acute on chronic respiratory failure due to pneumonia.  Patient follows with palliative care and hospice.   Patient presents to the ED today complaining of vomiting and generalized weakness.  She reported fever and hypotension at home. She has a central line in place.  In the ED, patient was afebrile but blood pressure slightly low with systolic around 100.  No change in oxygen requirement from baseline.  Labs showing WBC 23.1, hemoglobin 9.6 (at baseline), platelet count 447k, sodium 133, glucose 169, BUN 33, creatinine 1.6 (baseline 1.0-1.2), normal lipase and LFTs, BNP normal, lactic acid 1.7, SARS-CoV-2 PCR negative, respiratory viral panel negative, blood cultures collected.  Chest x-ray showing mild bibasilar subsegmental atelectasis or infiltrates.  ED physician discussed with Dr. Renold Don with ID who recommended starting the patient on vancomycin and cefepime.  Patient was also given IV hydrocortisone 100 mg and 500 mL normal saline in the ED.  Assessment & Plan:   Principal Problem:   Adrenal crisis (HCC) Active Problems:   Anemia   GERD (gastroesophageal reflux disease)   Anxiety and  depression   Leukocytosis   AKI (acute kidney injury) (HCC)   Sepsis (HCC)  Possible adrenal crisis Patient with history of resection of pituitary adenoma resulting in chronic adrenal insufficiency.  She takes hydrocortisone 20 mg in the morning and 10 mg in the afternoon at home and denies missing any doses.  She presented with generalized weakness/fatigue, nausea, vomiting, mild hyponatremia, and slight hypotension.  She was given IV hydrocortisone 100 mg in the ED followed by 4 more doses of hydrocortisone 50 mg IV each.  Blood pressure better, resuming home dose of hydrocortisone.   Sepsis secondary to hospital-acquired pneumonia, POA, chronic hypoxic respiratory failure/gram-negative bacteremia, POA: Patient meets criteria for sepsis based on evidence of pneumonia, tachycardia, tachypnea and leukocytosis.  She has elevated procalcitonin as well and recent hospitalization.  Patient uses 4 to 5 L of oxygen at home and she is currently at her baseline.  Ruled out of COVID as well as respiratory viral panel.  Patient's blood culture is now growing gram-negative rods.  Continue cefepime and vancomycin, I have formally consulted ID.   AKI on CKD stage II-IIIa Likely prerenal azotemia from dehydration in the setting of poor p.o. intake.  BUN 33, creatinine 1.6 (baseline 1.0-1.2).  Received IV fluids.  Creatinine has improved and back to baseline now.  Avoid nephrotoxic agents/hold home Bumex and Entresto.   Chronic anemia Hemoglobin at baseline and no signs of bleeding.  Continue to monitor labs.   Asthma/chronic bronchitis No wheezing.  Continue home inhalers.   Chronic CHF Mild aortic valve insufficiency Echo done in October 2023 showing EF 60 to 65%, grade 1 diastolic dysfunction, mild AVR, and mild AVS. No signs of volume overload, BNP normal.  Hold Bumex and Entresto  for another day and reassess tomorrow.   Hypothyroidism Continue Synthroid and check TSH.   Anxiety/depression Continue  home medications.   GERD Continue home medications.   Hyperlipidemia Continue statin.   Type 2 diabetes A1c 6.3 in May 2023, will repeat.  Sensitive sliding scale insulin.   Hypertension Hold antihypertensives at this time given concern for possible adrenal crisis.  Hypokalemia: Resolved.  DVT prophylaxis: enoxaparin (LOVENOX) injection 40 mg Start: 04/23/23 1000   Code Status: DNR  Family Communication:  None present at bedside.  Plan of care discussed with patient in length and he/she verbalized understanding and agreed with it.  Status is: Inpatient Remains inpatient appropriate because: Developing bacteremia.     Estimated body mass index is 28.34 kg/m as calculated from the following:   Height as of this encounter: 5\' 5"  (1.651 m).   Weight as of this encounter: 77.2 kg.    Nutritional Assessment: Body mass index is 28.34 kg/m.Marland Kitchen Seen by dietician.  I agree with the assessment and plan as outlined below: Nutrition Status:        . Skin Assessment: I have examined the patient's skin and I agree with the wound assessment as performed by the wound care RN as outlined below:    Consultants:  ID  Procedures:  None  Antimicrobials:  Anti-infectives (From admission, onward)    Start     Dose/Rate Route Frequency Ordered Stop   04/24/23 1000  vancomycin (VANCOREADY) IVPB 1500 mg/300 mL  Status:  Discontinued        1,500 mg 150 mL/hr over 120 Minutes Intravenous Every 36 hours 04/22/23 2353 04/24/23 0732   04/24/23 0830  vancomycin (VANCOREADY) IVPB 1500 mg/300 mL        1,500 mg 150 mL/hr over 120 Minutes Intravenous Every 24 hours 04/24/23 0732     04/24/23 0800  ceFEPIme (MAXIPIME) 2 g in sodium chloride 0.9 % 100 mL IVPB        2 g 200 mL/hr over 30 Minutes Intravenous Every 8 hours 04/24/23 0732     04/23/23 1000  ceFEPIme (MAXIPIME) 2 g in sodium chloride 0.9 % 100 mL IVPB  Status:  Discontinued        2 g 200 mL/hr over 30 Minutes Intravenous  Every 12 hours 04/22/23 2353 04/24/23 0732   04/22/23 2100  vancomycin (VANCOREADY) IVPB 1750 mg/350 mL        1,750 mg 175 mL/hr over 120 Minutes Intravenous STAT 04/22/23 2055 04/23/23 0015   04/22/23 2045  ceFEPIme (MAXIPIME) 2 g in sodium chloride 0.9 % 100 mL IVPB        2 g 200 mL/hr over 30 Minutes Intravenous STAT 04/22/23 2038 04/22/23 2154         Subjective: Patient seen and examined.  Complains of intermittent nausea.  She tells me that even at home, she uses IV Benadryl as well as oral Ativan as needed, typically every 6-8 hours to control her nausea.  She wants me to resume that regimen.  Also wants me to change her to regular diet from cardiac diet.  She has been transition to diabetic diet.  Objective: Vitals:   04/23/23 1258 04/23/23 2114 04/24/23 0515 04/24/23 0814  BP: (!) 94/56 124/76 121/74   Pulse: 88 85 85   Resp: 18 16    Temp: 98.8 F (37.1 C) 98.1 F (36.7 C) 97.8 F (36.6 C)   TempSrc: Oral Oral Oral   SpO2: 100% 100% 100% 100%  Weight:  Height:        Intake/Output Summary (Last 24 hours) at 04/24/2023 1009 Last data filed at 04/24/2023 0518 Gross per 24 hour  Intake 730 ml  Output 450 ml  Net 280 ml   Filed Weights   04/22/23 2015  Weight: 77.2 kg    Examination:  General exam: Appears calm and comfortable  Respiratory system: Clear to auscultation. Respiratory effort normal. Cardiovascular system: S1 & S2 heard, RRR. No JVD, murmurs, rubs, gallops or clicks. No pedal edema. Gastrointestinal system: Abdomen is nondistended, soft and nontender. No organomegaly or masses felt. Normal bowel sounds heard. Central nervous system: Alert and oriented. No focal neurological deficits. Extremities: Symmetric 5 x 5 power. Skin: No rashes, lesions or ulcers.  Psychiatry: Judgement and insight appear normal. Mood & affect appropriate.   Data Reviewed: I have personally reviewed following labs and imaging studies  CBC: Recent Labs  Lab  04/22/23 1735 04/23/23 0340 04/24/23 0336  WBC 23.1* 17.2* 11.5*  NEUTROABS 19.7*  --  8.7*  HGB 9.6* 8.7* 8.4*  HCT 31.6* 28.9* 28.9*  MCV 79.8* 79.4* 81.6  PLT 447* 382 367   Basic Metabolic Panel: Recent Labs  Lab 04/22/23 1735 04/23/23 0340 04/24/23 0336  NA 133* 136 140  K 3.8 3.2* 4.3  CL 99 105 111  CO2 22 21* 23  GLUCOSE 169* 147* 133*  BUN 33* 30* 21*  CREATININE 1.62* 1.24* 1.07*  CALCIUM 8.8* 8.4* 8.8*   GFR: Estimated Creatinine Clearance: 63.2 mL/min (A) (by C-G formula based on SCr of 1.07 mg/dL (H)). Liver Function Tests: Recent Labs  Lab 04/22/23 1735  AST 22  ALT 32  ALKPHOS 63  BILITOT 0.5  PROT 7.9  ALBUMIN 3.9   Recent Labs  Lab 04/22/23 1735  LIPASE 36   No results for input(s): "AMMONIA" in the last 168 hours. Coagulation Profile: No results for input(s): "INR", "PROTIME" in the last 168 hours. Cardiac Enzymes: No results for input(s): "CKTOTAL", "CKMB", "CKMBINDEX", "TROPONINI" in the last 168 hours. BNP (last 3 results) No results for input(s): "PROBNP" in the last 8760 hours. HbA1C: Recent Labs    04/23/23 0340  HGBA1C 6.4*   CBG: Recent Labs  Lab 04/23/23 0114 04/23/23 0739 04/23/23 2126 04/24/23 0739  GLUCAP 164* 145* 157* 109*   Lipid Profile: No results for input(s): "CHOL", "HDL", "LDLCALC", "TRIG", "CHOLHDL", "LDLDIRECT" in the last 72 hours. Thyroid Function Tests: Recent Labs    04/23/23 0340  TSH 0.508   Anemia Panel: No results for input(s): "VITAMINB12", "FOLATE", "FERRITIN", "TIBC", "IRON", "RETICCTPCT" in the last 72 hours. Sepsis Labs: Recent Labs  Lab 04/22/23 1735 04/23/23 0340  PROCALCITON  --  1.67  LATICACIDVEN 1.7  --     Recent Results (from the past 240 hour(s))  Culture, blood (routine x 2)     Status: None (Preliminary result)   Collection Time: 04/22/23  5:35 PM   Specimen: BLOOD  Result Value Ref Range Status   Specimen Description   Final    BLOOD PORTA CATH Performed at  [facility], [address] [place], [place] [address]    Special Requests   Final    BOTTLES DRAWN AEROBIC AND ANAEROBIC Blood Culture results may not be optimal due to an excessive volume of blood received in culture bottles Performed at [facility], [address], [place], [place] [address]    Culture  Setup Time   Final    GRAM POSITIVE RODS AEROBIC BOTTLE  ONLY CRITICAL RESULT CALLED TO, READ BACK BY AND VERIFIED WITH: Jinny Sanders PHARMD, AT 1358 04/23/23 Renato Shin Performed at [facility], [address] [place], [place] [address]    Culture GRAM POSITIVE RODS  Final   Report Status PENDING  Incomplete  Respiratory (~20 pathogens) panel by PCR     Status: None   Collection Time: 04/22/23  8:26 PM   Specimen: Nasopharyngeal Swab; Respiratory  Result Value Ref Range Status   Adenovirus NOT DETECTED NOT DETECTED Final   Coronavirus 229E NOT DETECTED NOT DETECTED Final    Comment: (NOTE) The Coronavirus on the Respiratory Panel, DOES NOT test for the novel  Coronavirus (2019 nCoV)    Coronavirus HKU1 NOT DETECTED NOT DETECTED Final   Coronavirus NL63 NOT DETECTED NOT DETECTED Final   Coronavirus OC43 NOT DETECTED NOT DETECTED Final   Metapneumovirus NOT DETECTED NOT DETECTED Final   Rhinovirus / Enterovirus NOT DETECTED NOT DETECTED Final   Influenza A NOT DETECTED NOT DETECTED Final   Influenza B NOT DETECTED NOT DETECTED Final   Parainfluenza Virus 1 NOT DETECTED NOT DETECTED Final   Parainfluenza Virus 2 NOT DETECTED NOT DETECTED Final   Parainfluenza Virus 3 NOT DETECTED NOT DETECTED Final   Parainfluenza Virus 4 NOT DETECTED NOT DETECTED Final   Respiratory Syncytial Virus NOT DETECTED NOT DETECTED Final   Bordetella pertussis NOT DETECTED NOT DETECTED Final   Bordetella Parapertussis NOT DETECTED NOT DETECTED Final   Chlamydophila pneumoniae NOT DETECTED NOT DETECTED Final   Mycoplasma pneumoniae NOT DETECTED NOT DETECTED  Final    Comment: Performed at [facility], [address], [place], [place] [address]  SARS Coronavirus 2 by RT PCR (hospital order, performed in [facility] hospital lab) *cepheid single result test* Nasopharyngeal Swab     Status: None   Collection Time: 04/22/23  8:26 PM   Specimen: Nasopharyngeal Swab; Nasal Swab  Result Value Ref Range Status   SARS Coronavirus 2 by RT PCR NEGATIVE NEGATIVE Final    Comment: (NOTE) SARS-CoV-2 target nucleic acids are NOT DETECTED.  The SARS-CoV-2 RNA is generally detectable in upper and lower respiratory specimens during the acute phase of infection. The lowest concentration of SARS-CoV-2 viral copies this assay can detect is 250 copies / mL. A negative result does not preclude SARS-CoV-2 infection and should not be used as the sole basis for treatment or other patient management decisions.  A negative result may occur with improper specimen collection / handling, submission of specimen other than nasopharyngeal swab, presence of viral mutation(s) within the areas targeted by this assay, and inadequate number of viral copies (<250 copies / mL). A negative result must be combined with clinical observations, patient history, and epidemiological information.  Fact Sheet for Patients:   RoadLapTop.co.za  Fact Sheet for Healthcare Providers: http://kim-miller.com/  This test is not yet approved or  cleared by the [place] FDA and has been authorized for detection and/or diagnosis of SARS-CoV-2 by FDA under an Emergency Use Authorization (EUA).  This EUA will remain in effect (meaning this test can be used) for the duration of the COVID-19 declaration under Section 564(b)(1) of the Act, 21 U.S.C. section 360bbb-3(b)(1), unless the authorization is terminated or revoked sooner.  Performed at [facility], [address] [place], [place] [address]      Radiology  Studies: DG Chest Portable 1 View  Result Date: 04/22/2023 CLINICAL DATA:  Weakness. EXAM: PORTABLE CHEST 1 VIEW COMPARISON:  March 30, 2023. FINDINGS: The heart size and mediastinal contours are within normal limits. Right internal jugular catheter is unchanged. Hypoinflation of the lungs is noted with mild bibasilar subsegmental atelectasis or infiltrate. The visualized skeletal structures are unremarkable. IMPRESSION: Hypoinflation of the lungs with mild bibasilar subsegmental atelectasis or infiltrates. Electronically Signed   By: Lupita Raider M.D.   On: 04/22/2023 18:05    Scheduled Meds:  aspirin EC  81 mg Oral Daily   buPROPion  150 mg Oral Daily   Chlorhexidine Gluconate Cloth  6 each Topical Daily   clonazePAM  1 mg Oral BID   enoxaparin (LOVENOX) injection  40 mg Subcutaneous Q24H   escitalopram  20 mg Oral Q1500   famotidine  20 mg Oral Daily   hydrocortisone  10 mg Oral q1600   hydrocortisone  20 mg Oral Daily   insulin aspart  0-5 Units Subcutaneous QHS   insulin aspart  0-9 Units Subcutaneous TID WC   lamoTRIgine  200 mg Oral QHS   lamoTRIgine  25 mg Oral q morning   levothyroxine  100 mcg Oral Q0600   pantoprazole  40 mg Oral Daily   rosuvastatin  10 mg Oral Daily   sodium chloride flush  10-40 mL Intracatheter Q12H   topiramate  150 mg Oral QHS   umeclidinium bromide  1 puff Inhalation Daily   Continuous Infusions:  ceFEPime (MAXIPIME) IV 2 g (04/24/23 0939)   vancomycin 1,500 mg (04/24/23 0827)     LOS: 1 day   Hughie Closs, MD [facility]  04/24/2023, 10:09 AM   *Please note that this is a verbal dictation therefore any spelling or grammatical errors are due to the "Dragon Medical One" system interpretation.  Please page via Amion and do not message via secure chat for urgent patient care matters. Secure chat can be used for non urgent patient care matters.  How to contact the [facility] Attending or Consulting provider 7A - 7P or covering provider during  after hours 7P -7A, for this patient?  Check the care team in [facility] and look for a) attending/consulting [facility] provider listed and b) the [facility] team listed. Page or secure chat 7A-7P. Log into [email or web address] and use [facility] universal password to access. If you do not have the password, please contact the hospital operator. Locate the [facility] provider you are looking for under [facility] and page to a number that you can be directly reached. If you still have difficulty reaching the provider, please page the [facility] (Director on Call) for the Hospitalists listed on amion for assistance.

## 2023-04-24 NOTE — Plan of Care (Signed)

## 2023-04-25 ENCOUNTER — Inpatient Hospital Stay (HOSPITAL_COMMUNITY): Payer: BC Managed Care – PPO

## 2023-04-25 ENCOUNTER — Other Ambulatory Visit: Payer: Self-pay | Admitting: Pulmonary Disease

## 2023-04-25 ENCOUNTER — Other Ambulatory Visit (HOSPITAL_COMMUNITY): Payer: BC Managed Care – PPO

## 2023-04-25 ENCOUNTER — Encounter: Payer: Self-pay | Admitting: Hematology and Oncology

## 2023-04-25 ENCOUNTER — Other Ambulatory Visit (HOSPITAL_COMMUNITY): Payer: Self-pay

## 2023-04-25 DIAGNOSIS — I38 Endocarditis, valve unspecified: Secondary | ICD-10-CM

## 2023-04-25 DIAGNOSIS — R112 Nausea with vomiting, unspecified: Secondary | ICD-10-CM

## 2023-04-25 DIAGNOSIS — J189 Pneumonia, unspecified organism: Secondary | ICD-10-CM

## 2023-04-25 DIAGNOSIS — E272 Addisonian crisis: Secondary | ICD-10-CM | POA: Diagnosis not present

## 2023-04-25 LAB — BASIC METABOLIC PANEL
Anion gap: 7 (ref 5–15)
CO2: 25 mmol/L (ref 22–32)
Calcium: 8.8 mg/dL — ABNORMAL LOW (ref 8.9–10.3)
Chloride: 107 mmol/L (ref 98–111)
Creatinine, Ser: 0.84 mg/dL (ref 0.44–1.00)
GFR, Estimated: >60 mL/min (ref >60)
Glucose, Bld: 107 mg/dL — ABNORMAL HIGH (ref 70–99)
Potassium: 3.7 mmol/L (ref 3.5–5.1)
Sodium: 139 mmol/L (ref 135–145)

## 2023-04-25 LAB — ECHOCARDIOGRAM COMPLETE
AR max vel: 0.74 cm2
AV Area VTI: 0.8 cm2
AV Area mean vel: 0.72 cm2
AV Mean grad: 11 mmHg
AV Peak grad: 18.1 mmHg
Ao pk vel: 2.13 m/s
Area-P 1/2: 6.65 cm2
S' Lateral: 2.3 cm

## 2023-04-25 LAB — CBC WITH DIFFERENTIAL/PLATELET
Abs Immature Granulocytes: 0.03 10*3/uL (ref 0.00–0.07)
Basophils Absolute: 0.1 10*3/uL (ref 0.0–0.1)
Basophils Relative: 1 %
Eosinophils Absolute: 0.2 10*3/uL (ref 0.0–0.5)
Eosinophils Relative: 2 %
HCT: 26.7 % — ABNORMAL LOW (ref 36.0–46.0)
Hemoglobin: 7.7 g/dL — ABNORMAL LOW (ref 12.0–15.0)
Immature Granulocytes: 0 %
Lymphocytes Relative: 28 %
Lymphs Abs: 2.8 10*3/uL (ref 0.7–4.0)
MCH: 23.8 pg — ABNORMAL LOW (ref 26.0–34.0)
MCHC: 28.8 g/dL — ABNORMAL LOW (ref 30.0–36.0)
MCV: 82.4 fL (ref 80.0–100.0)
Monocytes Absolute: 0.9 10*3/uL (ref 0.1–1.0)
Monocytes Relative: 9 %
Neutrophils Relative %: 60 %
Platelets: 337 10*3/uL (ref 150–400)
RBC: 3.24 MIL/uL — ABNORMAL LOW (ref 3.87–5.11)
RDW: 17.4 % — ABNORMAL HIGH (ref 11.5–15.5)
WBC: 10 10*3/uL (ref 4.0–10.5)
nRBC: 0 % (ref 0.0–0.2)

## 2023-04-25 LAB — GLUCOSE, CAPILLARY
Glucose-Capillary: 70 mg/dL (ref 70–99)
Glucose-Capillary: 86 mg/dL (ref 70–99)

## 2023-04-25 MED ORDER — LINEZOLID 600 MG PO TABS: 600.0000 mg | ORAL_TABLET | Freq: Two times a day (BID) | ORAL | 0 refills | Status: AC

## 2023-04-25 MED ORDER — LINEZOLID 600 MG PO TABS
600.0000 mg | ORAL_TABLET | Freq: Two times a day (BID) | ORAL | 0 refills | Status: DC
  Filled 2023-04-25: qty 28, 14d supply, fill #0

## 2023-04-25 MED ORDER — HEPARIN SOD (PORK) LOCK FLUSH 100 UNIT/ML IV SOLN
250.0000 [IU] | INTRAVENOUS | Status: AC | PRN
  Administered 2023-04-25: 250 [IU]

## 2023-04-25 MED ORDER — FLUCONAZOLE 150 MG PO TABS
150.0000 mg | ORAL_TABLET | Freq: Once | ORAL | Status: AC
  Administered 2023-04-25: 150 mg via ORAL
  Filled 2023-04-25: qty 1

## 2023-04-25 NOTE — Hospital Course (Signed)
52 y.o. 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  She was admitted last month 6/8-6/12 for acute on chronic respiratory failure due to pneumonia.  Patient follows with palliative care and hospice.   Patient presents to the ED today complaining of vomiting and generalized weakness.  She reported fever and hypotension at home. She has a central line in place.  In the ED, patient was afebrile but blood pressure slightly low with systolic around 100.  No change in oxygen requirement from baseline.  Labs showing WBC 23.1, hemoglobin 9.6 (at baseline), platelet count 447k, sodium 133, glucose 169, BUN 33, creatinine 1.6 (baseline 1.0-1.2), normal lipase and LFTs, BNP normal, lactic acid 1.7, SARS-CoV-2 PCR negative, respiratory viral panel negative, blood cultures collected.  Chest x-ray showing mild bibasilar subsegmental atelectasis or infiltrates.  ED physician discussed with Dr. Renold Don with ID who recommended starting the patient on vancomycin and cefepime.  Patient was also given IV hydrocortisone 100 mg and 500 mL normal saline in the ED.

## 2023-04-25 NOTE — Consult Note (Signed)
[facility]    Date of Admission:  04/22/2023   Total days of inpatient antibiotics 2        Reason for Consult: Blood Cx+ bacillus    Principal Problem:   Adrenal crisis (HCC) Active Problems:   GERD (gastroesophageal reflux disease)   Anxiety and depression   Anemia   Leukocytosis   AKI (acute kidney injury) (HCC)   Sepsis (HCC)   Assessment: 52 year old female with a complicated medical history including adrenal insufficiency on hydrocortisone, multiple malignancies including Hodgkin's cephalin remission, breast cancer with bilateral mastectomy, cervical cancer status post LEEP left lower extremity for possible precancerous lesion, thyroid cancer status post thyroidectomy admitted with adrenal crisis.  ID engaged present blood cultures growing bacillus species. #Hospital-acquired pneumonia - Chest x-ray showed subsegmental atelectasis versus infiltrates.  Patient was started on vancomycin and cefepime  #Blood cultures positive for bacillus sp in the seeing of Right CVC single-lumen  #Multiple administrations for bacteremia/setting of central line - She had admission 6/8-6/12 for pneumonia.  At that hospitalization blood culture 1 out of 2 grew strep viridans.  She was treated with ceftriaxone and azithromycin during hospitalization and then discharged.  She had a single-lumen CVC that was placed on 4/19 during that hospitalization, removed on 6/25.  Looks from record review she has been between palliative and hospice. -Of note previously followed by ID - Bacillus species is 1/1 blood cultures are generally regarded as contaminant .  on review of her history given her recurrent bacteremia, being on hydrocortisone for adrenal insufficiency could lead to immunosuppression and CVC line would treat this as true bacteremia.  CVC will need to come out, will get TTE -No tenderness/erythema appreciated at CVC insertion site.  Recommendations:  -Repeat  blood cultures - Continue vancomycin and cefepime - Would recommend removal of CVC, Discussed with pt. She is not currently amendable to get it removed inpatient.  - TTE, given strep species in the past that was incompletely treated I suspect she will need a TEE. -I had an extensive conversation with the pt today in regards to above issue. She reviewed her medical history including decision to go between palliative an hospice. She has a vacation tomorrow which she would like to go on. I communicated with primary for goals of care discussion.  #Adrenal crisis - Management per primary, she received high-dose steroids, now on home dosing. 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  Recent admission last month 6 8-12 for operatory failure due to pneumonia, follows with palliative and hospice.  Presented to ED complaining of vomiting/generalized weakness.  On arrival to the ED patient afebrile, WBC 23 K.  Workup shows negative RP.  Cystoscopy shows bibasilar subsegmental atelectasis or infiltrate.  Started on vancomycin and cefepime.  She started on hydrocortisone for adrenal crisis as well.   Review of Systems:  Review of Systems  All other systems  reviewed and are negative.   Past Medical History:  Diagnosis Date   Addison's disease (HCC)    Adrenal insufficiency (HCC)    Anemia    Anxiety    Aortic stenosis    Aortic stenosis    Appendicitis 12/19/09   Appendicitis    Breast cancer (HCC)    STATUS POST BILATERAL MASTECTOMY. STATUS POST RECONSTRUCTION. SHE HAD SILICONE BREAST IMPLANTS AND THE LEFT IMPLANT IS LEAKING SLIGHTLY   Cellulitis of right middle finger 11/07/2018   Cervical cancer (HCC) 12/23/2018   Chest pain    CHF with right heart failure (HCC) 04/17/2017   Chronic respiratory failure with hypoxia (HCC) 12/23/2018   Cough variant asthma 04/13/2019   Depression    GERD (gastroesophageal reflux disease)    takes Dexilant and carafate and gi coctail    Headache    migraines on a daily and monthly regimen    Heart murmur    History of kidney stones    Hodgkin lymphoma (HCC)    STATUS POST MANTLE RADIATION   Hodgkin's lymphoma (HCC)    1987   Hypertension    Hypoxia    Necrotizing fasciitis (HCC) 12/23/2018   Non-ischemic cardiomyopathy (HCC)    Osteoporosis    Palpitations    Pituitary adenoma (HCC) 12/23/2018   Pneumonia    PONV (postoperative nausea and vomiting)    Pre-diabetes    per pt; no meds   Pulmonary hypertension (HCC) 12/23/2018   Raynaud phenomenon    Right heart failure (HCC) 04/17/2017   Seizures (HCC)    last febrile seizure was approx 3 weeks ago per report on 12/01/2020   Sleep apnea    upcoming sleep study per pt    Supplemental oxygen dependent    3 liters   SVT (supraventricular tachycardia)    Tachycardia    Thyroid cancer (HCC)    STATUS POST SURGICAL REMOVAL-CURRENT ON THYROID REPLACEMENT    Social History   Tobacco Use   Smoking status: Never   Smokeless tobacco: Never  Vaping Use   Vaping status: Never Used  Substance Use Topics   Alcohol use: Not Currently    Comment: social    Drug use: No    Family History  Family history unknown: Yes   Scheduled Meds:  aspirin  EC  81 mg Oral Daily   buPROPion  150 mg Oral Daily   Chlorhexidine Gluconate Cloth  6 each Topical Daily   clonazePAM  1 mg Oral BID   enoxaparin (LOVENOX) injection  40 mg Subcutaneous Q24H   escitalopram  20 mg Oral Q1500   famotidine  20 mg Oral Daily   hydrocortisone  10 mg Oral q1600   hydrocortisone  20 mg Oral Daily   insulin aspart  0-5 Units Subcutaneous QHS   insulin aspart  0-9 Units Subcutaneous TID WC   lamoTRIgine  200 mg Oral QHS   lamoTRIgine  25 mg Oral q morning   levothyroxine  100 mcg Oral Q0600   pantoprazole  40 mg Oral Daily   rosuvastatin  10 mg Oral Daily   sodium chloride flush  10-40 mL Intracatheter Q12H   topiramate  150 mg Oral QHS   umeclidinium bromide  1 puff Inhalation Daily   Continuous Infusions:  ceFEPime (MAXIPIME) IV 2 g (04/25/23 1032)   vancomycin 1,500 mg (04/24/23 0827)   PRN Meds:.acetaminophen **OR** acetaminophen, albuterol, diphenhydrAMINE, LORazepam, oxyCODONE, sodium chloride flush, sucralfate, zolpidem Allergies  Allergen Reactions   Ferrous Bisglycinate Chelate [Iron] Anaphylaxis and Other (See Comments)    Only IV - only FERRLICET   Mushroom Extract Complex Anaphylaxis   Na Ferric Gluc Cplx In Sucrose Anaphylaxis   Cymbalta [Duloxetine Hcl] Swelling and Anxiety   Hydromorphone Other (See Comments)    BP drop and heart rate drops 5.1.20 PT REPORTS THAT SHE TAKES DILAUDID AT HOME   Ondansetron Hcl Other (See Comments)   Promethazine Other (See Comments)    Other reaction(s): Unknown   Succinylcholine Other (See Comments)    Lock Jaw   Buprenorphine Hcl Hives   Compazine Other (See Comments)    Altered mental status Aggression   Duloxetine     Other reaction(s): Not available   Metoclopramide Other (See Comments)    Dystonia   Morphine And Codeine Hives   Ondansetron Hives and Rash    Other reaction(s): Unknown Other reaction(s): Unknown   Promethazine Hcl Hives   Tegaderm Ag Mesh [Silver] Rash    Old formulation  only, is able to tolerate new formulation    OBJECTIVE: Blood pressure 126/69, pulse 82, temperature 98.2 F (36.8 C), temperature source Oral, resp. rate 18, height 5\' 5"  (1.651 m), weight 77.2 kg, SpO2 99%.  Physical Exam Constitutional:      Appearance: Normal appearance.  HENT:     Head: Normocephalic and atraumatic.     Right Ear: Tympanic membrane normal.     Left Ear: Tympanic membrane normal.     Nose: Nose normal.     Mouth/Throat:     Mouth: Mucous membranes are moist.  Eyes:     Extraocular Movements: Extraocular movements intact.     Conjunctiva/sclera: Conjunctivae normal.     Pupils: Pupils are equal, round, and reactive to light.  Cardiovascular:     Rate and Rhythm: Normal rate and regular rhythm.     Heart sounds: No murmur heard.    No friction rub. No gallop.     Comments: Right chest CVC, without tenderness, surrounding erythema Pulmonary:     Effort: Pulmonary effort is normal.     Breath sounds: Normal breath sounds.  Abdominal:     General: Abdomen is flat.     Palpations: Abdomen is soft.  Musculoskeletal:     Comments: Left finger ampuation  Skin:    General: Skin is warm and dry.  Neurological:     General: No focal deficit present.     Mental Status: She is alert and oriented to person, place, and time.  Psychiatric:        Mood and Affect: Mood normal.     Lab Results Lab Results  Component Value Date   WBC 10.0 04/25/2023   HGB 7.7 (L) 04/25/2023   HCT 26.7 (L) 04/25/2023   MCV 82.4 04/25/2023   PLT 337 04/25/2023    Lab Results  Component Value Date   CREATININE 0.84 04/25/2023   BUN 17 04/25/2023   NA 139 04/25/2023   K 3.7 04/25/2023   CL 107 04/25/2023   CO2 25 04/25/2023    Lab Results  Component Value Date   ALT 32 04/22/2023   AST 22 04/22/2023   ALKPHOS 63 04/22/2023   BILITOT 0.5 04/22/2023       Danelle Earthly, MD [facility] 04/25/2023, 11:11 AM   I  have personally spent 100 minutes involved in face-to-face and non-face-to-face activities for this patient on the day of the visit. Professional  time spent includes the following activities: Preparing to see the patient (review of tests), Obtaining and/or reviewing separately obtained history (admission/discharge record), Performing a medically appropriate examination and/or evaluation , Ordering medications/tests/procedures, referring and communicating with other health care professionals, Documenting clinical information in the EMR, Independently interpreting results (not separately reported), Communicating results to the patient/family/caregiver, Counseling and educating the patient/family/caregiver and Care coordination (not separately reported).

## 2023-04-25 NOTE — Progress Notes (Signed)
  Echocardiogram 2D Echocardiogram has been performed.  Leda Roys RDCS 04/25/2023, 3:04 PM

## 2023-04-25 NOTE — Discharge Summary (Signed)
Physician Discharge Summary   Patient: [name] MRN: [id number] DOB: 06/29/1971  Admit date:     04/22/2023  Discharge date: 04/25/23  Discharge Physician: [name]   PCP: Susan Amel, MD   Recommendations at discharge:    Follow up with PCP as needed Follow up with Hospice and Palliative Care as scheduled  Discharge Diagnoses: Principal Problem:   Adrenal crisis (HCC) Active Problems:   Anemia   GERD (gastroesophageal reflux disease)   Anxiety and depression   Leukocytosis   AKI (acute kidney injury) (HCC)   Sepsis (HCC)  Resolved Problems:   * No resolved hospital problems. *  Hospital Course: 52 y.o. 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  She was admitted last month 6/8-6/12 for acute on chronic respiratory failure due to pneumonia.  Patient follows with palliative care and hospice.   Patient presents to the ED today complaining of vomiting and generalized weakness.  She reported fever and hypotension at home. She has a central line in place.  In the ED, patient was afebrile but blood pressure slightly low with systolic around 100.  No change in oxygen requirement from baseline.  Labs showing WBC 23.1, hemoglobin 9.6 (at baseline), platelet count 447k, sodium 133, glucose 169, BUN 33, creatinine 1.6 (baseline 1.0-1.2), normal lipase and LFTs, BNP normal, lactic acid 1.7, SARS-CoV-2 PCR  negative, respiratory viral panel negative, blood cultures collected.  Chest x-ray showing mild bibasilar subsegmental atelectasis or infiltrates.  ED physician discussed with Dr. Renold Don with ID who recommended starting the patient on vancomycin and cefepime.  Patient was also given IV hydrocortisone 100 mg and 500 mL normal saline in the ED.  Assessment and Plan: Possible adrenal crisis Patient with history of resection of pituitary adenoma resulting in chronic adrenal insufficiency.  She takes hydrocortisone 20 mg in the morning and 10 mg in the afternoon at home and denies missing any doses.  She presented with generalized weakness/fatigue, nausea, vomiting, mild hyponatremia, and slight hypotension.  She was given IV hydrocortisone 100 mg in the ED followed by 4 more doses of hydrocortisone 50 mg IV each.  Blood pressure better, resuming home dose of hydrocortisone.   Sepsis secondary to bacillus bacteremia present on admit. PNA ruled out:  Patient meets criteria for sepsis based on evidence of pneumonia, tachycardia, tachypnea and leukocytosis.  She has elevated procalcitonin as well and recent hospitalization.  Patient uses 4 to 5 L of oxygen at home and she is currently at her baseline.  Ruled out of COVID as well as respiratory viral panel.  Patient's blood culture is now growing gram-negative rods, found to grow bacillus.  -Pt was continued on cefepime and vancomycin -Seen by ID. Recommendations for more invasive w/u including TEE and likely port removal with repeat blood cx -Pt is alert and oriented, fully aware of her situation. Pt is adamant that port is not to be removed and she is certain she would NOT want any further workup. Instead, pt would prefer to go home to spend time with family as she is transitioning to  hospice -Discussed with ID who recommended linezolid x 2 weeks. Pt is very much aware that abx alone would not be curative   AKI on CKD stage II-IIIa Likely prerenal azotemia from  dehydration in the setting of poor p.o. intake.  BUN 33, creatinine 1.6 (baseline 1.0-1.2).  Received IV fluids.  Creatinine has improved and back to baseline now.  Avoid nephrotoxic agents/hold home Bumex and Entresto.   Chronic anemia Hemoglobin at baseline and no signs of bleeding.  .   Asthma/chronic bronchitis No wheezing.  Continue home inhalers.   Chronic CHF Mild aortic valve insufficiency Echo done in October 2023 showing EF 60 to 65%, grade 1 diastolic dysfunction, mild AVR, and mild AVS. No signs of volume overload, BNP normal.   -cont home meds on d/c   Hypothyroidism Continue Synthroid    Anxiety/depression Continue home medications.   GERD Continue home medications.   Hyperlipidemia Continue statin.   Type 2 diabetes A1c 6.3 in May 2023, will repeat.   -cont home meds on d/c   Hypertension -cont home meds on d/c   Hypokalemia: Resolved.    Consultants: ID Procedures performed:   Disposition: Home Diet recommendation:  Carb modified diet DISCHARGE MEDICATION: Allergies as of 04/25/2023       Reactions   Ferrous Bisglycinate Chelate [iron] Anaphylaxis, Other (See Comments)   Only IV - only FERRLICET   Mushroom Extract Complex Anaphylaxis   Na Ferric Gluc Cplx In Sucrose Anaphylaxis   Cymbalta [duloxetine Hcl] Swelling, Anxiety   Hydromorphone Other (See Comments)   BP drop and heart rate drops 5.1.20 PT REPORTS THAT SHE TAKES DILAUDID AT HOME   Ondansetron Hcl Other (See Comments)   Promethazine Other (See Comments)   Other reaction(s): Unknown   Succinylcholine Other (See Comments)   Lock Jaw   Buprenorphine Hcl Hives   Compazine Other (See Comments)   Altered mental status Aggression   Duloxetine    Other reaction(s): Not available   Metoclopramide Other (See Comments)   Dystonia   Morphine And Codeine Hives   Ondansetron Hives, Rash   Other reaction(s): Unknown Other reaction(s): Unknown   Promethazine Hcl Hives   Tegaderm Ag Mesh  [silver] Rash   Old formulation only, is able to tolerate new formulation        Medication List     STOP taking these medications    dronabinol 2.5 MG capsule Commonly known as: MARINOL   Entresto 24-26 MG Generic drug: sacubitril-valsartan   guaiFENesin 100 MG/5ML liquid Commonly known as: ROBITUSSIN   HYDROmorphone 4 MG tablet Commonly known as: Dilaudid   menthol-cetylpyridinium 3 MG lozenge Commonly known as: CEPACOL   pantoprazole 40 MG tablet Commonly known as: PROTONIX   Qvar RediHaler 80 MCG/ACT inhaler Generic drug: beclomethasone       TAKE these medications    aspirin EC 81 MG tablet Take 1 tablet (81 mg total) by mouth daily. Swallow whole. What changed:  when to take this additional instructions   bumetanide 1 MG tablet Commonly known as: BUMEX TAKE 1 TABLET BY MOUTH TWICE A DAY What changed: when to take this   buPROPion 150 MG 24 hr tablet Commonly known as: WELLBUTRIN XL TAKE 1 TABLET BY MOUTH EVERY DAY What changed:  when to take this additional instructions   clonazePAM 1 MG tablet Commonly known as: KLONOPIN Take 1 tablet (1 mg total) by mouth 2 (two) times daily.   dexlansoprazole 60 MG capsule Commonly known as: DEXILANT Take 60 mg by  mouth in the morning.   diphenhydrAMINE 12.5 MG/5ML elixir Commonly known as: BENADRYL Take 10 mLs (25 mg total) by mouth every 6 (six) hours as needed (nausea).   Emgality 120 MG/ML Sosy Generic drug: Galcanezumab-gnlm Inject 120 mg into the skin every 28 (twenty-eight) days.   EPINEPHrine 0.3 mg/0.3 mL Soaj injection Commonly known as: EPI-PEN INJECT 0.3 MG INTO THE MUSCLE AS NEEDED FOR ANAPHYLAXIS.   escitalopram 20 MG tablet Commonly known as: LEXAPRO TAKE 1 TABLET BY MOUTH EVERY DAY IN THE EVENING What changed:  how much to take how to take this when to take this additional instructions   famotidine 40 MG/5ML suspension Commonly known as: PEPCID Take 2.5 mLs (20 mg total) by  mouth daily.   Florastor 250 MG capsule Generic drug: saccharomyces boulardii Take 250 mg by mouth 2 (two) times daily. Mid Morning and Mid Afternoon   fluticasone-salmeterol 230-21 MCG/ACT inhaler Commonly known as: ADVAIR HFA INHALE 2 PUFFS INTO THE LUNGS TWICE A DAY   Heparin Na (Pork) Lock Flsh PF 100 UNIT/ML Soln Commonly known as: BD Heparin PosiFlush USE 5 MLS IN PORT A CATH ONCE DAILY AFTER MEDICATION ADMINISTRATION AS A HEPLOCK AS DIRECTED.   hydrocortisone 10 MG tablet Commonly known as: CORTEF Take 1-2 tablets (10-20 mg total) by mouth See admin instructions. Take 20 mg in the am and 10mg  in the evening. Take after completing prednisone What changed: additional instructions   hydrOXYzine 25 MG tablet Commonly known as: ATARAX Take 1 tablet (25 mg total) by mouth every 6 (six) hours as needed for anxiety.   lamoTRIgine 200 MG tablet Commonly known as: LAMICTAL TAKE 1 TABLET BY MOUTH EVERYDAY AT BEDTIME What changed:  how much to take how to take this when to take this additional instructions   lamoTRIgine 25 MG tablet Commonly known as: LaMICtal Take 1 tablet (25 mg total) by mouth daily. What changed:  when to take this additional instructions   linezolid 600 MG tablet Commonly known as: ZYVOX Take 1 tablet (600 mg total) by mouth 2 (two) times daily for 14 days.   LORazepam 0.5 MG tablet Commonly known as: Ativan Take 1 tablet (0.5 mg total) by mouth 3 (three) times daily as needed for anxiety. What changed:  how to take this additional instructions   nitroGLYCERIN 0.4 MG SL tablet Commonly known as: NITROSTAT Dissolve 1 tablet under the tongue every 5 minutes as needed for chest pain. Max of 3 doses, then 911.   Normal Saline Flush 0.9 % Soln Use as directed for central venous catheter maintenance   Normal Saline Flush 0.9 % Soln Use as directed for central venous catheter maintenance   Nurtec 75 MG Tbdp Generic drug: Rimegepant Sulfate Take  75 mg by mouth daily as needed (Migraine).   OneTouch Delica Plus Lancet33G Misc 3 (three) times daily. for testing   OneTouch Verio test strip Generic drug: glucose blood 3 (three) times daily. for testing   Oxycodone HCl 10 MG Tabs Take 1 tablet by mouth every 6 (six) hours as needed (pain). What changed: Another medication with the same name was removed. Continue taking this medication, and follow the directions you see here.   OXYGEN Inhale 4-5 L/min into the lungs continuous.   ProAir HFA 108 (90 Base) MCG/ACT inhaler Generic drug: albuterol Inhale 2 puffs into the lungs every 4 (four) hours as needed for wheezing or shortness of breath. What changed: Another medication with the same name was removed. Continue taking this medication,  and follow the directions you see here.   albuterol (2.5 MG/3ML) 0.083% nebulizer solution Commonly known as: PROVENTIL Take 3 mLs (2.5 mg total) by nebulization every 6 (six) hours as needed for wheezing or shortness of breath. What changed: Another medication with the same name was removed. Continue taking this medication, and follow the directions you see here.   rosuvastatin 10 MG tablet Commonly known as: CRESTOR Take 10 mg by mouth in the morning. Mid morning   silver sulfADIAZINE 1 % cream Commonly known as: SILVADENE Apply to affected area daily What changed:  how much to take how to take this when to take this reasons to take this additional instructions   Spiriva Respimat 1.25 MCG/ACT Aers Generic drug: Tiotropium Bromide Monohydrate Inhale 2 puffs into the lungs daily.   sucralfate 1 GM/10ML suspension Commonly known as: CARAFATE Take 1 g by mouth daily as needed (as directed for ulcers).   Synthroid 100 MCG tablet Generic drug: levothyroxine Take 1 tablet (100 mcg total) by mouth every morning. What changed: additional instructions   topiramate 50 MG tablet Commonly known as: TOPAMAX Take 150 mg by mouth at  bedtime.   Xeroform Occlusive Gauze Strip Pads Apply 1 each topically as directed.   zolpidem 12.5 MG CR tablet Commonly known as: AMBIEN CR Take 12.5 mg by mouth at bedtime.        Follow-up Information     Toppin, Lillia Dallas, MD Follow up in 2 week(s).   Specialty: Internal Medicine Why: As needed Contact information: [address] [place] [address] [place] [address] [phone]                Discharge Exam: Ceasar Mons Weights   04/22/23 2015  Weight: 77.2 kg   General exam: Awake, laying in bed, in nad Respiratory system: Normal respiratory effort, no wheezing Cardiovascular system: regular rate, s1, s2 Gastrointestinal system: Soft, nondistended, positive BS Central nervous system: CN2-12 grossly intact, strength intact Extremities: Perfused, no clubbing Skin: Normal skin turgor, no notable skin lesions seen Psychiatry: Mood normal // no visual hallucinations   Condition at discharge: fair  The results of significant diagnostics from this hospitalization (including imaging, microbiology, ancillary and laboratory) are listed below for reference.   Imaging Studies: ECHOCARDIOGRAM COMPLETE  Result Date: 04/25/2023    ECHOCARDIOGRAM REPORT   Patient Name:   [name] Date of Exam: 04/25/2023 Medical Rec #:  [id number]      Height:       65.0 in Accession #:    [id number]     Weight:       170.3 lb Date of Birth:  1970-10-20      BSA:          1.848 m Patient Age:    52 years       BP:           117/68 mmHg Patient Gender: F              HR:           94 bpm. Exam Location:  Inpatient Procedure: 2D Echo, Color Doppler and Cardiac Doppler Indications:    Endocarditis  History:        Patient has prior history of Echocardiogram examinations, most                 recent 07/30/2022. Cardiomyopathy and CHF; Aortic Valve Disease.  Sonographer:    Harriette Bouillon RDCS Referring Phys: [id number] [facility] IMPRESSIONS  1. Technically  difficult study with poor visualization of  cardiac structures.  2. Left ventricular ejection fraction, by estimation, is 60 to 65%. The left ventricle has normal function. The left ventricle has no regional wall motion abnormalities. Left ventricular diastolic parameters were normal.  3. Right ventricular systolic function is normal. The right ventricular size is normal. There is normal pulmonary artery systolic pressure.  4. The mitral valve was not well visualized. No evidence of mitral valve regurgitation. No evidence of mitral stenosis.  5. The aortic valve was not well visualized. There is mild calcification of the aortic valve.There is atleast mild aortic regurgitation. No aortic stenosis is present.  6. The inferior vena cava is normal in size with greater than 50% respiratory variability, suggesting right atrial pressure of 3 mmHg. FINDINGS  Left Ventricle: Left ventricular ejection fraction, by estimation, is 60 to 65%. The left ventricle has normal function. The left ventricle has no regional wall motion abnormalities. The left ventricular internal cavity size was normal in size. There is  no left ventricular hypertrophy. Left ventricular diastolic parameters were normal. Right Ventricle: The right ventricular size is normal. No increase in right ventricular wall thickness. Right ventricular systolic function is normal. There is normal pulmonary artery systolic pressure. The tricuspid regurgitant velocity is 1.98 m/s, and  with an assumed right atrial pressure of 3 mmHg, the estimated right ventricular systolic pressure is 18.7 mmHg. Left Atrium: Left atrial size was normal in size. Right Atrium: Right atrial size was normal in size. Pericardium: There is no evidence of pericardial effusion. Mitral Valve: The mitral valve was not well visualized. No evidence of mitral valve regurgitation. No evidence of mitral valve stenosis. Tricuspid Valve: The tricuspid valve is not well visualized. Tricuspid valve regurgitation is not demonstrated. No evidence  of tricuspid stenosis. Aortic Valve: The aortic valve was not well visualized. There is mild calcification of the aortic valve. Aortic valve regurgitation is mild. No aortic stenosis is present. Aortic valve mean gradient measures 11.0 mmHg. Aortic valve peak gradient measures  18.1 mmHg. Aortic valve area, by VTI measures 0.80 cm. Pulmonic Valve: The pulmonic valve was not assessed. Pulmonic valve regurgitation is not visualized. No evidence of pulmonic stenosis. Aorta: The aortic root is normal in size and structure. Venous: The inferior vena cava is normal in size with greater than 50% respiratory variability, suggesting right atrial pressure of 3 mmHg. IAS/Shunts: No atrial level shunt detected by color flow Doppler.  LEFT VENTRICLE PLAX 2D LVIDd:         4.10 cm   Diastology LVIDs:         2.30 cm   LV e' medial:    6.20 cm/s LV PW:         0.90 cm   LV E/e' medial:  17.7 LV IVS:        0.90 cm   LV e' lateral:   8.05 cm/s LVOT diam:     1.60 cm   LV E/e' lateral: 13.7 LV SV:         35 LV SV Index:   19 LVOT Area:     2.01 cm  RIGHT VENTRICLE            IVC RV S prime:     7.40 cm/s  IVC diam: 1.80 cm TAPSE (M-mode): 2.4 cm LEFT ATRIUM         Index LA diam:    3.40 cm 1.84 cm/m  AORTIC VALVE AV Area (Vmax):    0.74  cm AV Area (Vmean):   0.72 cm AV Area (VTI):     0.80 cm AV Vmax:           213.00 cm/s AV Vmean:          155.333 cm/s AV VTI:            0.438 m AV Peak Grad:      18.1 mmHg AV Mean Grad:      11.0 mmHg LVOT Vmax:         78.30 cm/s LVOT Vmean:        56.000 cm/s LVOT VTI:          0.174 m LVOT/AV VTI ratio: 0.40  AORTA Ao Root diam: 2.40 cm Ao Asc diam:  2.80 cm MITRAL VALVE                TRICUSPID VALVE MV Area (PHT): 6.65 cm     TR Peak grad:   15.7 mmHg MV Decel Time: 114 msec     TR Vmax:        198.00 cm/s MV E velocity: 110.00 cm/s MV A velocity: 113.00 cm/s  SHUNTS MV E/A ratio:  0.97         Systemic VTI:  0.17 m                             Systemic Diam: 1.60 cm Aditya Sabharwal  Electronically signed by Dorthula Nettles Signature Date/Time: 04/25/2023/3:33:51 PM    Final    DG Chest Portable 1 View  Result Date: 04/22/2023 CLINICAL DATA:  Weakness. EXAM: PORTABLE CHEST 1 VIEW COMPARISON:  March 30, 2023. FINDINGS: The heart size and mediastinal contours are within normal limits. Right internal jugular catheter is unchanged. Hypoinflation of the lungs is noted with mild bibasilar subsegmental atelectasis or infiltrate. The visualized skeletal structures are unremarkable. IMPRESSION: Hypoinflation of the lungs with mild bibasilar subsegmental atelectasis or infiltrates. Electronically Signed   By: Lupita Raider M.D.   On: 04/22/2023 18:05   DG Chest Portable 1 View  Result Date: 03/30/2023 CLINICAL DATA:  Chest pain 2 days. EXAM: PORTABLE CHEST 1 VIEW COMPARISON:  03/09/2023 FINDINGS: Right IJ central venous catheter has tip just below the cavoatrial junction unchanged. Lungs are hypoinflated with persistent left base opacification which may be due to atelectasis or infection. Minimal hazy prominence of the central pulmonary vessels with interval improvement compatible with improved vascular congestion. Cardiomediastinal silhouette and remainder of the exam is unchanged. IMPRESSION: 1. Hypoinflation with persistent left base opacification which may be due to atelectasis or infection. 2. Improved vascular congestion. Electronically Signed   By: Elberta Fortis M.D.   On: 03/30/2023 10:19    Microbiology: Results for orders placed or performed during the hospital encounter of 04/22/23  Culture, blood (routine x 2)     Status: Abnormal   Collection Time: 04/22/23  5:35 PM   Specimen: BLOOD  Result Value Ref Range Status   Specimen Description   Final    BLOOD PORTA CATH Performed at [facility], [address] [place], [place] [address]    Special Requests   Final    BOTTLES DRAWN AEROBIC AND ANAEROBIC Blood Culture results may not be optimal due to an  excessive volume of blood received in culture bottles Performed at [facility], [address] [place], [place] [address]    Culture  Setup Time   Final  GRAM POSITIVE RODS AEROBIC BOTTLE ONLY CRITICAL RESULT CALLED TO, READ BACK BY AND VERIFIED WITH: S. DAVIS PHARMD, AT 1358 04/23/23 D. VANHOOK    Culture (A)  Final    BACILLUS SPECIES Standardized susceptibility testing for this organism is not available. Performed at [facility], [address] [place], [place] [address]    Report Status 04/24/2023 FINAL  Final  Respiratory (~20 pathogens) panel by PCR     Status: None   Collection Time: 04/22/23  8:26 PM   Specimen: Nasopharyngeal Swab; Respiratory  Result Value Ref Range Status   Adenovirus NOT DETECTED NOT DETECTED Final   Coronavirus 229E NOT DETECTED NOT DETECTED Final    Comment: (NOTE) The Coronavirus on the Respiratory Panel, DOES NOT test for the novel  Coronavirus (2019 nCoV)    Coronavirus HKU1 NOT DETECTED NOT DETECTED Final   Coronavirus NL63 NOT DETECTED NOT DETECTED Final   Coronavirus OC43 NOT DETECTED NOT DETECTED Final   Metapneumovirus NOT DETECTED NOT DETECTED Final   Rhinovirus / Enterovirus NOT DETECTED NOT DETECTED Final   Influenza A NOT DETECTED NOT DETECTED Final   Influenza B NOT DETECTED NOT DETECTED Final   Parainfluenza Virus 1 NOT DETECTED NOT DETECTED Final   Parainfluenza Virus 2 NOT DETECTED NOT DETECTED Final   Parainfluenza Virus 3 NOT DETECTED NOT DETECTED Final   Parainfluenza Virus 4 NOT DETECTED NOT DETECTED Final   Respiratory Syncytial Virus NOT DETECTED NOT DETECTED Final   Bordetella pertussis NOT DETECTED NOT DETECTED Final   Bordetella Parapertussis NOT DETECTED NOT DETECTED Final   Chlamydophila pneumoniae NOT DETECTED NOT DETECTED Final   Mycoplasma pneumoniae NOT DETECTED NOT DETECTED Final    Comment: Performed at [facility], [address] [place], [place] [address]  SARS Coronavirus  2 by RT PCR (hospital order, performed in [facility] hospital lab) *cepheid single result test* Nasopharyngeal Swab     Status: None   Collection Time: 04/22/23  8:26 PM   Specimen: Nasopharyngeal Swab; Nasal Swab  Result Value Ref Range Status   SARS Coronavirus 2 by RT PCR NEGATIVE NEGATIVE Final    Comment: (NOTE) SARS-CoV-2 target nucleic acids are NOT DETECTED.  The SARS-CoV-2 RNA is generally detectable in upper and lower respiratory specimens during the acute phase of infection. The lowest concentration of SARS-CoV-2 viral copies this assay can detect is 250 copies / mL. A negative result does not preclude SARS-CoV-2 infection and should not be used as the sole basis for treatment or other patient management decisions.  A negative result may occur with improper specimen collection / handling, submission of specimen other than nasopharyngeal swab, presence of viral mutation(s) within the areas targeted by this assay, and inadequate number of viral copies (<250 copies / mL). A negative result must be combined with clinical observations, patient history, and epidemiological information.  Fact Sheet for Patients:   RoadLapTop.co.za  Fact Sheet for Healthcare Providers: http://kim-miller.com/  This test is not yet approved or  cleared by the [place] FDA and has been authorized for detection and/or diagnosis of SARS-CoV-2 by FDA under an Emergency Use Authorization (EUA).  This EUA will remain in effect (meaning this test can be used) for the duration of the COVID-19 declaration under Section 564(b)(1) of the Act, 21 U.S.C. section 360bbb-3(b)(1), unless the authorization is terminated or revoked sooner.  Performed at [facility], [address], [place], [place] [address]    *Note: Due to a large number of  results and/or encounters for the requested time period, some results have not been displayed. A  complete set of results can be found in Results Review.    Labs: CBC: Recent Labs  Lab 04/22/23 1735 04/23/23 0340 04/24/23 0336 04/25/23 0317  WBC 23.1* 17.2* 11.5* 10.0  NEUTROABS 19.7*  --  8.7* 6.1  HGB 9.6* 8.7* 8.4* 7.7*  HCT 31.6* 28.9* 28.9* 26.7*  MCV 79.8* 79.4* 81.6 82.4  PLT 447* 382 367 337   Basic Metabolic Panel: Recent Labs  Lab 04/22/23 1735 04/23/23 0340 04/24/23 0336 04/25/23 0317  NA 133* 136 140 139  K 3.8 3.2* 4.3 3.7  CL 99 105 111 107  CO2 22 21* 23 25  GLUCOSE 169* 147* 133* 107*  BUN 33* 30* 21* 17  CREATININE 1.62* 1.24* 1.07* 0.84  CALCIUM 8.8* 8.4* 8.8* 8.8*   Liver Function Tests: Recent Labs  Lab 04/22/23 1735  AST 22  ALT 32  ALKPHOS 63  BILITOT 0.5  PROT 7.9  ALBUMIN 3.9   CBG: Recent Labs  Lab 04/24/23 1149 04/24/23 1612 04/24/23 2209 04/25/23 0730 04/25/23 1201  GLUCAP 67* 130* 143* 70 86    Discharge time spent: less than 30 minutes.  Signed: Rickey Barbara, MD [facility] 04/25/2023

## 2023-04-26 ENCOUNTER — Encounter: Payer: Self-pay | Admitting: Hematology and Oncology

## 2023-04-26 ENCOUNTER — Other Ambulatory Visit (HOSPITAL_COMMUNITY): Payer: Self-pay

## 2023-04-28 DIAGNOSIS — G8222 Paraplegia, incomplete: Secondary | ICD-10-CM | POA: Diagnosis not present

## 2023-04-28 DIAGNOSIS — R911 Solitary pulmonary nodule: Secondary | ICD-10-CM | POA: Diagnosis not present

## 2023-04-30 ENCOUNTER — Other Ambulatory Visit: Payer: Self-pay

## 2023-05-02 ENCOUNTER — Encounter: Payer: Self-pay | Admitting: Hematology and Oncology

## 2023-05-02 ENCOUNTER — Other Ambulatory Visit (HOSPITAL_COMMUNITY): Payer: Self-pay

## 2023-05-03 ENCOUNTER — Other Ambulatory Visit (HOSPITAL_COMMUNITY): Payer: Self-pay

## 2023-05-03 ENCOUNTER — Other Ambulatory Visit: Payer: Self-pay | Admitting: Adult Health

## 2023-05-03 ENCOUNTER — Encounter: Payer: Self-pay | Admitting: Hematology and Oncology

## 2023-05-03 DIAGNOSIS — F331 Major depressive disorder, recurrent, moderate: Secondary | ICD-10-CM

## 2023-05-05 NOTE — Telephone Encounter (Signed)
Due for an appt. MyChart message sent.

## 2023-05-06 ENCOUNTER — Telehealth: Payer: Self-pay | Admitting: Adult Health

## 2023-05-06 ENCOUNTER — Other Ambulatory Visit: Payer: Self-pay | Admitting: Adult Health

## 2023-05-06 ENCOUNTER — Encounter: Payer: Self-pay | Admitting: Adult Health

## 2023-05-06 ENCOUNTER — Telehealth (INDEPENDENT_AMBULATORY_CARE_PROVIDER_SITE_OTHER): Payer: BC Managed Care – PPO | Admitting: Adult Health

## 2023-05-06 DIAGNOSIS — F411 Generalized anxiety disorder: Secondary | ICD-10-CM | POA: Diagnosis not present

## 2023-05-06 DIAGNOSIS — G4700 Insomnia, unspecified: Secondary | ICD-10-CM | POA: Diagnosis not present

## 2023-05-06 DIAGNOSIS — F331 Major depressive disorder, recurrent, moderate: Secondary | ICD-10-CM | POA: Diagnosis not present

## 2023-05-06 DIAGNOSIS — F410 Panic disorder [episodic paroxysmal anxiety] without agoraphobia: Secondary | ICD-10-CM | POA: Diagnosis not present

## 2023-05-06 MED ORDER — CLONAZEPAM 1 MG PO TABS
1.0000 mg | ORAL_TABLET | Freq: Two times a day (BID) | ORAL | 2 refills | Status: DC
Start: 2023-05-06 — End: 2023-08-01

## 2023-05-06 MED ORDER — ALPRAZOLAM 1 MG PO TABS
1.0000 mg | ORAL_TABLET | Freq: Every day | ORAL | 2 refills | Status: DC
Start: 2023-05-06 — End: 2023-07-26

## 2023-05-06 NOTE — Telephone Encounter (Signed)
[name] called in on 8/4 at 8:06pm. She is struggling with anxiety and panic attacks. She is not doing well at all. Asks for a call asap- I did call her and got her down for today with Gina at 3:20. She will have her husband join her for video appt.

## 2023-05-06 NOTE — Progress Notes (Signed)
[name] [id number] 11-22-1970 52 y.o.  Virtual Visit via Video Note  I connected with pt @ on 05/06/23 at  3:20 PM EDT by a video enabled telemedicine application and verified that I am speaking with the correct person using two identifiers.   I discussed the limitations of evaluation and management by telemedicine and the availability of in person appointments. The patient expressed understanding and agreed to proceed.  I discussed the assessment and treatment plan with the patient. The patient was provided an opportunity to ask questions and all were answered. The patient agreed with the plan and demonstrated an understanding of the instructions.   The patient was advised to call back or seek an in-person evaluation if the symptoms worsen or if the condition fails to improve as anticipated.  I provided 25 minutes of non-face-to-face time during this encounter.  The patient was located at home.  The provider was located at [facility].   Susan Gibbs, NP   Subjective:   Patient ID:  [name] is a 52 y.o. (DOB 14-Nov-1970) female.  Chief Complaint: No chief complaint on file.   HPI THOMASINA HOLLEMAN presents for follow-up of MDD, GAD, insomnia, and panic attacks.  Accompanied by husband.  Describes mood today as "not good". Pleasant. Tearful. Mood symptoms - reports depression, anxiety and irritability. Reports panic attacks. Reports worry, rumination, and over thinking. Mood is lower. Stating "I'm not doing too good".  Recently admitted back into palliative care and is awaiting an assessment. Has been without any type of pain relievers. Feels like medications are helpful, but is experiencing extreme anxiety and is willing to consider other options. Family supportive. Decreased interest and motivation. Taking medications as prescribed. Working with [name] for therapy.  Energy levels low. Active, does not have a regular exercise routine with current  physical disabilities. Unable to enjoy usual interests and activities. Married. Lives with husband and son. Has a daughter - 71 in [place]. Spending time with family.  Appetite adequate. Weight loss - 159 pounds. Sleeps better some nights than others.  Focus and concentration difficulties. Completing tasks. Managing aspects of household. Disabled since 2016. Denies SI or HI.  Denies AH or VH. Denies self harm. Denies substance use.   Previous medication trials: Wellbutrin - mean, Zoloft-not helpful    Review of Systems:  Review of Systems  Musculoskeletal:  Negative for gait problem.  Neurological:  Negative for tremors.  Psychiatric/Behavioral:         Please refer to HPI    Medications: I have reviewed the patient's current medications.  Current Outpatient Medications  Medication Sig Dispense Refill   ALPRAZolam (XANAX) 1 MG tablet Take 1 tablet (1 mg total) by mouth daily. 30 tablet 2   albuterol (PROVENTIL) (2.5 MG/3ML) 0.083% nebulizer solution Take 3 mLs (2.5 mg total) by nebulization every 6 (six) hours as needed for wheezing or shortness of breath. 360 mL 2   aspirin EC 81 MG tablet Take 1 tablet (81 mg total) by mouth daily. Swallow whole. (Patient taking differently: Take 81 mg by mouth at bedtime. Chewable) 90 tablet 3   Bismuth Tribromoph-Petrolatum (XEROFORM OCCLUSIVE GAUZE STRIP) PADS Apply 1 each topically as directed. 50 each 1   bumetanide (BUMEX) 1 MG tablet TAKE 1 TABLET BY MOUTH TWICE A DAY (Patient taking differently: Take 1 mg by mouth 2 (two) times daily.) 180 tablet 3   buPROPion (WELLBUTRIN XL) 150 MG 24 hr tablet TAKE 1 TABLET BY MOUTH  EVERY DAY (Patient taking differently: Take 150 mg by mouth in the morning. Mid Morning) 90 tablet 0   clonazePAM (KLONOPIN) 1 MG tablet Take 1 tablet (1 mg total) by mouth 2 (two) times daily. 60 tablet 2   dexlansoprazole (DEXILANT) 60 MG capsule Take 60 mg by mouth in the morning.     diphenhydrAMINE (BENADRYL) 12.5  MG/5ML elixir Take 10 mLs (25 mg total) by mouth every 6 (six) hours as needed (nausea). 120 mL 0   EMGALITY 120 MG/ML SOSY Inject 120 mg into the skin every 28 (twenty-eight) days.     EPINEPHRINE 0.3 mg/0.3 mL IJ SOAJ injection INJECT 0.3 MG INTO THE MUSCLE AS NEEDED FOR ANAPHYLAXIS. 2 each 1   escitalopram (LEXAPRO) 20 MG tablet TAKE 1 TABLET BY MOUTH EVERY DAY IN THE EVENING (Patient taking differently: Take 20 mg by mouth daily in the afternoon. Afternoon) 90 tablet 1   famotidine (PEPCID) 40 MG/5ML suspension Take 2.5 mLs (20 mg total) by mouth daily. 75 mL 0   FLORASTOR 250 MG capsule Take 250 mg by mouth 2 (two) times daily. Mid Morning and Mid Afternoon     fluticasone-salmeterol (ADVAIR HFA) 230-21 MCG/ACT inhaler INHALE 2 PUFFS INTO THE LUNGS TWICE A DAY (Patient not taking: Reported on 04/22/2023) 12 each 5   Heparin Na, Pork, Lock Flsh PF (BD HEPARIN POSIFLUSH) 100 UNIT/ML SOLN USE 5 MLS IN PORT A CATH ONCE DAILY AFTER MEDICATION ADMINISTRATION AS A HEPLOCK AS DIRECTED. 150 mL 3   hydrocortisone (CORTEF) 10 MG tablet Take 1-2 tablets (10-20 mg total) by mouth See admin instructions. Take 20 mg in the am and 10mg  in the evening. Take after completing prednisone (Patient taking differently: Take 10-20 mg by mouth See admin instructions. Take 20 mg in the am at 10 am and 10 mg in the afternoon. Take after completing prednisone)     hydrOXYzine (ATARAX) 25 MG tablet Take 1 tablet (25 mg total) by mouth every 6 (six) hours as needed for anxiety. 30 tablet 0   lamoTRIgine (LAMICTAL) 200 MG tablet TAKE 1 TABLET BY MOUTH EVERYDAY AT BEDTIME (Patient taking differently: Take 200 mg by mouth at bedtime.) 30 tablet 5   lamoTRIgine (LAMICTAL) 25 MG tablet Take 1 tablet (25 mg total) by mouth daily. (Patient taking differently: Take 25 mg by mouth every morning. Mid Morning) 30 tablet 2   Lancets (ONETOUCH DELICA PLUS LANCET33G) MISC 3 (three) times daily. for testing     linezolid (ZYVOX) 600 MG tablet  Take 1 tablet (600 mg) by mouth 2 times daily for 14 days. 28 tablet 0   LORazepam (ATIVAN) 0.5 MG tablet Take 1 tablet (0.5 mg total) by mouth 3 (three) times daily as needed for anxiety. (Patient taking differently: Place 0.5 mg into feeding tube 3 (three) times daily as needed for anxiety. Access Port For Nausea) 12 tablet 0   nitroGLYCERIN (NITROSTAT) 0.4 MG SL tablet Dissolve 1 tablet under the tongue every 5 minutes as needed for chest pain. Max of 3 doses, then 911. 25 tablet 6   ONETOUCH VERIO test strip 3 (three) times daily. for testing     Oxycodone HCl 10 MG TABS Take 1 tablet by mouth every 6 (six) hours as needed (pain).     OXYGEN Inhale 4-5 L/min into the lungs continuous.     PROAIR HFA 108 (90 Base) MCG/ACT inhaler Inhale 2 puffs into the lungs every 4 (four) hours as needed for wheezing or shortness of breath.  Rimegepant Sulfate (NURTEC) 75 MG TBDP Take 75 mg by mouth daily as needed (Migraine).      rosuvastatin (CRESTOR) 10 MG tablet Take 10 mg by mouth in the morning. Mid morning     silver sulfADIAZINE (SILVADENE) 1 % cream Apply to affected area daily (Patient taking differently: Apply 1 Application topically daily as needed.) 50 g 1   Sodium Chloride Flush (NORMAL SALINE FLUSH) 0.9 % SOLN Use as directed for central venous catheter maintenance 600 mL 3   Sodium Chloride Flush (NORMAL SALINE FLUSH) 0.9 % SOLN Use as directed for central venous catheter maintenance 600 mL 3   sucralfate (CARAFATE) 1 GM/10ML suspension Take 1 g by mouth daily as needed (as directed for ulcers).     SYNTHROID 100 MCG tablet Take 1 tablet (100 mcg total) by mouth every morning. (Patient taking differently: Take 100 mcg by mouth every morning. * Must be name brand*)     Tiotropium Bromide Monohydrate (SPIRIVA RESPIMAT) 1.25 MCG/ACT AERS Inhale 2 puffs into the lungs daily. 4 g 5   topiramate (TOPAMAX) 50 MG tablet Take 150 mg by mouth at bedtime.      zolpidem (AMBIEN CR) 12.5 MG CR tablet Take  12.5 mg by mouth at bedtime.     No current facility-administered medications for this visit.    Medication Side Effects: None  Allergies:  Allergies  Allergen Reactions   Ferrous Bisglycinate Chelate [Iron] Anaphylaxis and Other (See Comments)    Only IV - only FERRLICET   Mushroom Extract Complex Anaphylaxis   Na Ferric Gluc Cplx In Sucrose Anaphylaxis   Cymbalta [Duloxetine Hcl] Swelling and Anxiety   Hydromorphone Other (See Comments)    BP drop and heart rate drops 5.1.20 PT REPORTS THAT SHE TAKES DILAUDID AT HOME   Ondansetron Hcl Other (See Comments)   Promethazine Other (See Comments)    Other reaction(s): Unknown   Succinylcholine Other (See Comments)    Lock Jaw   Buprenorphine Hcl Hives   Compazine Other (See Comments)    Altered mental status Aggression   Duloxetine     Other reaction(s): Not available   Metoclopramide Other (See Comments)    Dystonia   Morphine And Codeine Hives   Ondansetron Hives and Rash    Other reaction(s): Unknown Other reaction(s): Unknown   Promethazine Hcl Hives   Tegaderm Ag Mesh [Silver] Rash    Old formulation only, is able to tolerate new formulation    Past Medical History:  Diagnosis Date   Addison's disease (HCC)    Adrenal insufficiency (HCC)    Anemia    Anxiety    Aortic stenosis    Aortic stenosis    Appendicitis 12/19/09   Appendicitis    Breast cancer (HCC)    STATUS POST BILATERAL MASTECTOMY. STATUS POST RECONSTRUCTION. SHE HAD SILICONE BREAST IMPLANTS AND THE LEFT IMPLANT IS LEAKING SLIGHTLY   Cellulitis of right middle finger 11/07/2018   Cervical cancer (HCC) 12/23/2018   Chest pain    CHF with right heart failure (HCC) 04/17/2017   Chronic respiratory failure with hypoxia (HCC) 12/23/2018   Cough variant asthma 04/13/2019   Depression    GERD (gastroesophageal reflux disease)    takes Dexilant and carafate and gi coctail    Headache    migraines on a daily and monthly regimen    Heart murmur    History  of kidney stones    Hodgkin lymphoma (HCC)    STATUS POST MANTLE RADIATION   Hodgkin's  lymphoma (HCC)    1987   Hypertension    Hypoxia    Necrotizing fasciitis (HCC) 12/23/2018   Non-ischemic cardiomyopathy (HCC)    Osteoporosis    Palpitations    Pituitary adenoma (HCC) 12/23/2018   Pneumonia    PONV (postoperative nausea and vomiting)    Pre-diabetes    per pt; no meds   Pulmonary hypertension (HCC) 12/23/2018   Raynaud phenomenon    Right heart failure (HCC) 04/17/2017   Seizures (HCC)    last febrile seizure was approx 3 weeks ago per report on 12/01/2020   Sleep apnea    upcoming sleep study per pt    Supplemental oxygen dependent    3 liters   SVT (supraventricular tachycardia)    Tachycardia    Thyroid cancer (HCC)    STATUS POST SURGICAL REMOVAL-CURRENT ON THYROID REPLACEMENT    Family History  Family history unknown: Yes    Social History   Socioeconomic History   Marital status: Married    Spouse name: Not on file   Number of children: Not on file   Years of education: Not on file   Highest education level: Not on file  Occupational History   Not on file  Tobacco Use   Smoking status: Never   Smokeless tobacco: Never  Vaping Use   Vaping status: Never Used  Substance and Sexual Activity   Alcohol use: Not Currently    Comment: social    Drug use: No   Sexual activity: Not Currently    Birth control/protection: Surgical  Other Topics Concern   Not on file  Social History Narrative   Not on file   Social Determinants of Health   Financial Resource Strain: Medium Risk (08/21/2017)   Received from [facility], [facility]   Overall Financial Resource Strain (CARDIA)    Difficulty of Paying Living Expenses: Somewhat hard  Food Insecurity: No Food Insecurity (04/23/2023)   Hunger Vital Sign    Worried About Running Out of Food in the Last Year: Never true    Ran Out of Food in the Last Year: Never true   Transportation Needs: No Transportation Needs (04/23/2023)   PRAPARE - Administrator, Civil Service (Medical): No    Lack of Transportation (Non-Medical): No  Physical Activity: Unknown (08/21/2017)   Received from [facility], [facility]   Exercise Vital Sign    Days of Exercise per Week: 0 days    Minutes of Exercise per Session: Not on file  Stress: Stress Concern Present (08/21/2017)   Received [facility], [facility]   [employer] of Occupational Health - Occupational Stress Questionnaire    Feeling of Stress : Very much  Social Connections: Unknown (02/12/2022)   Received from [facility], [facility]   Social Network    Social Network: Not on file  Intimate Partner Violence: Not At Risk (04/23/2023)   Humiliation, Afraid, Rape, and Kick questionnaire    Fear of Current or Ex-Partner: No    Emotionally Abused: No    Physically Abused: No    Sexually Abused: No    Past Medical History, Surgical history, Social history, and Family history were reviewed and updated as appropriate.   Please see review of systems for further details on the patient's review from today.   Objective:   Physical Exam:  LMP  (LMP Unknown)   Physical Exam Constitutional:  General: She is not in acute distress. Musculoskeletal:        General: No deformity.  Neurological:     Mental Status: She is alert and oriented to person, place, and time.     Coordination: Coordination normal.  Psychiatric:        Attention and Perception: Attention and perception normal. She does not perceive auditory or visual hallucinations.        Mood and Affect: Affect is not labile, blunt, angry or inappropriate.        Speech: Speech normal.        Behavior: Behavior normal.        Thought Content: Thought content normal. Thought content is not paranoid or delusional. Thought content does not include homicidal  or suicidal ideation. Thought content does not include homicidal or suicidal plan.        Cognition and Memory: Cognition and memory normal.        Judgment: Judgment normal.     Comments: Insight intact     Lab Review:     Component Value Date/Time   NA 139 04/25/2023 0317   NA 140 08/20/2022 1513   K 3.7 04/25/2023 0317   CL 107 04/25/2023 0317   CO2 25 04/25/2023 0317   GLUCOSE 107 (H) 04/25/2023 0317   BUN 17 04/25/2023 0317   BUN 28 (H) 08/20/2022 1513   CREATININE 0.84 04/25/2023 0317   CREATININE 1.08 (H) 02/09/2022 1202   CALCIUM 8.8 (L) 04/25/2023 0317   PROT 7.9 04/22/2023 1735   PROT 7.1 01/23/2018 1225   ALBUMIN 3.9 04/22/2023 1735   ALBUMIN 4.4 01/23/2018 1225   AST 22 04/22/2023 1735   AST 26 02/09/2022 1202   ALT 32 04/22/2023 1735   ALT 40 02/09/2022 1202   ALKPHOS 63 04/22/2023 1735   BILITOT 0.5 04/22/2023 1735   BILITOT 0.4 02/09/2022 1202   GFRNONAA >60 04/25/2023 0317   GFRNONAA >60 02/09/2022 1202   GFRAA >60 04/27/2020 0613   GFRAA >60 02/10/2020 1020       Component Value Date/Time   WBC 10.0 04/25/2023 0317   RBC 3.24 (L) 04/25/2023 0317   HGB 7.7 (L) 04/25/2023 0317   HGB 10.6 (L) 08/20/2022 1513   HCT 26.7 (L) 04/25/2023 0317   HCT 34.4 08/20/2022 1513   PLT 337 04/25/2023 0317   PLT 414 08/20/2022 1513   MCV 82.4 04/25/2023 0317   MCV 78 (L) 08/20/2022 1513   MCH 23.8 (L) 04/25/2023 0317   MCHC 28.8 (L) 04/25/2023 0317   RDW 17.4 (H) 04/25/2023 0317   RDW 16.5 (H) 08/20/2022 1513   LYMPHSABS 2.8 04/25/2023 0317   MONOABS 0.9 04/25/2023 0317   EOSABS 0.2 04/25/2023 0317   BASOSABS 0.1 04/25/2023 0317    No results found for: "POCLITH", "LITHIUM"   No results found for: "PHENYTOIN", "PHENOBARB", "VALPROATE", "CBMZ"   .res Assessment: Plan:    Plan:  PDMP reviewed  Lexapro 20mg  daily Clonazepam 0.5mg  - 4 times daily. Wellbutrin XL 150mg  every morning - did not tolerate increase in dose - reports seizure  history Lamictal 200mg  daily Lamictal 25mg  every morning  Seeing [name] therapy visits.  Discussed potential benefits, risk, and side effects of benzodiazepines to include potential risk of tolerance and dependence, as well as possible drowsiness.  Advised patient not to drive if experiencing drowsiness and to take lowest possible effective dose to minimize risk of dependence and tolerance.   Counseled patient regarding potential benefits, risks, and side  effects of Lamictal to include potential risk of Stevens-Johnson syndrome. Advised patient to stop taking Lamictal and contact office immediately if rash develops and to seek urgent medical attention if rash is severe and/or spreading quickly.  RTC 4 weeks  Patient advised to contact office with any questions, adverse effects, or acute worsening in signs and symptoms. Diagnoses and all orders for this visit:  Major depressive disorder, recurrent episode, moderate (HCC)  Panic attacks -     ALPRAZolam (XANAX) 1 MG tablet; Take 1 tablet (1 mg total) by mouth daily. -     clonazePAM (KLONOPIN) 1 MG tablet; Take 1 tablet (1 mg total) by mouth 2 (two) times daily.  Insomnia, unspecified type  Generalized anxiety disorder     Please see After Visit Summary for patient specific instructions.  Future Appointments  Date Time Provider Department Center  05/08/2023  5:00 PM Mathis Fare, LCSW [facility] None    No orders of the defined types were placed in this encounter.     -------------------------------

## 2023-05-08 ENCOUNTER — Ambulatory Visit: Payer: Medicare Other | Admitting: Psychiatry

## 2023-05-08 ENCOUNTER — Ambulatory Visit (INDEPENDENT_AMBULATORY_CARE_PROVIDER_SITE_OTHER): Payer: BC Managed Care – PPO | Admitting: Psychiatry

## 2023-05-08 DIAGNOSIS — F411 Generalized anxiety disorder: Secondary | ICD-10-CM | POA: Diagnosis not present

## 2023-05-08 NOTE — Progress Notes (Signed)
[facility] Counselor/Therapist Progress Note  Patient ID: [name], MRN: [id number],    Date: 05/08/2023  Time Spent: 45 minutes   Treatment Type: Individual Therapy  Virtual Visit via Telehealth Note: MyChart Video session Connected with patient by a telemedicine/telehealth application, with their informed consent, and verified patient privacy and that I am speaking with the correct person using two identifiers. I discussed the limitations, risks, security and privacy concerns of performing psychotherapy and the availability of in person appointments. I also discussed with the patient that there may be a patient responsible charge related to this service. The patient expressed understanding and agreed to proceed. I discussed the treatment planning with the patient. The patient was provided an opportunity to ask questions and all were answered. The patient agreed with the plan and demonstrated an understanding of the instructions. The patient was advised to call  our office if  symptoms worsen or feel they are in a crisis state and need immediate contact.   Therapist Location: office Patient Location: home  Reported Symptoms: anxiety, depression  Mental Status Exam:  Appearance:   Casual     Behavior:  Appropriate and Sharing  Motor:  Normal  Speech/Language:   Clear and Coherent  Affect:  Tearful, depressed, anxious  Mood:  anxious, depressed, and sad  Thought process:  goal directed  Thought content:    Some obsessive thoughts  Sensory/Perceptual disturbances:    WNL  Orientation:  oriented to person, place, time/date, situation, day of week, month of year, year, and stated date of Aug. 7, 2024  Attention:  Fair  Concentration:  Poor  Memory:  Patient reporting "Poor"  Fund of knowledge:   Fair  Insight:    Good and Fair  Judgment:   Good  Impulse Control:  Good and Fair   Risk Assessment: Danger to Self:  No Self-injurious Behavior: No Danger to Others:  No Duty to Warn:no Physical Aggression / Violence:No  Access to Firearms a concern: No  Gang Involvement:No   Subjective: Patient today sharing her frustration, anger, anxiety, depression, and some hopelessness at times. Focused on hopelessness first and she was able to talk through this more and feel supported. Also agreed that she would contact palliative care or our office is she felt she might harm herself. She remains in outpatient Palliative care. Worked more on her anger and depression today, although included some focus on her anxiety as she identified it as her main symptom but behavior and communication seemed to also indicate more anger and depression.  Did seem much more stable by end of appointment today and again committed to not do anything to harm herself.  Denied feeling as hopeless and denied feelings of self-harm.  She has some medical appointments coming up that conflicted with her next appointment with [place] however we got our appointment rescheduled to a time that would work for her.   Interventions: Cognitive Behavioral Therapy, Ego-Supportive, and Grief Therapy  Long-term goal:  Patient will confront her anxiety, fears, anger reported re: her illness and use time in therapy and beyond to work on this, while receiving feedback and support from therapist. Help her determine what she feels is realistic for her considering her health challenges and difficult prognosis.  Short-term goal: Participate in therapy and initiate communication of needs and desires at this point in her struggle physically.  Strategies: Share and talk through anxiety, anger, and depression that are experienced and work to find and use helpful coping  tools to better manage those feelings.  Diagnosis:   ICD-10-CM   1. Generalized anxiety disorder  F41.1      Plan: Patient today openly talking and sharing her feelings of anxiety, some anger, depression, and frustration as well as some occasional  hopelessness.  By end of session she was calmer and denied thoughts of self-harm and stated that she would never follow through on that.  Also committed that if those thoughts return she would be in touch with our office or with palliative care.  Over time, she has been able to make some progress and needs to continue her work with goal-directed behaviors as part of her "self-care" going forward.  Goal review and progress/challenges noted with patient.  Next appointment within 3 weeks.   Mathis Fare, LCSW

## 2023-05-11 ENCOUNTER — Other Ambulatory Visit: Payer: Self-pay | Admitting: Cardiovascular Disease

## 2023-05-14 ENCOUNTER — Other Ambulatory Visit (HOSPITAL_COMMUNITY): Payer: Self-pay

## 2023-05-14 ENCOUNTER — Encounter: Payer: Self-pay | Admitting: Hematology and Oncology

## 2023-05-14 MED ORDER — ZOLPIDEM TARTRATE ER 12.5 MG PO TBCR
12.5000 mg | EXTENDED_RELEASE_TABLET | Freq: Every evening | ORAL | 0 refills | Status: DC | PRN
Start: 2023-05-14 — End: 2024-06-28
  Filled 2023-05-14: qty 30, 30d supply, fill #0

## 2023-05-15 DIAGNOSIS — Z0000 Encounter for general adult medical examination without abnormal findings: Secondary | ICD-10-CM | POA: Diagnosis not present

## 2023-05-15 DIAGNOSIS — E785 Hyperlipidemia, unspecified: Secondary | ICD-10-CM | POA: Diagnosis not present

## 2023-05-15 DIAGNOSIS — F339 Major depressive disorder, recurrent, unspecified: Secondary | ICD-10-CM | POA: Diagnosis not present

## 2023-05-15 DIAGNOSIS — R739 Hyperglycemia, unspecified: Secondary | ICD-10-CM | POA: Diagnosis not present

## 2023-05-15 DIAGNOSIS — F419 Anxiety disorder, unspecified: Secondary | ICD-10-CM | POA: Diagnosis not present

## 2023-05-15 DIAGNOSIS — E559 Vitamin D deficiency, unspecified: Secondary | ICD-10-CM | POA: Diagnosis not present

## 2023-05-15 DIAGNOSIS — E538 Deficiency of other specified B group vitamins: Secondary | ICD-10-CM | POA: Diagnosis not present

## 2023-05-16 ENCOUNTER — Encounter (HOSPITAL_BASED_OUTPATIENT_CLINIC_OR_DEPARTMENT_OTHER): Payer: Self-pay | Admitting: Pulmonary Disease

## 2023-05-16 ENCOUNTER — Ambulatory Visit (INDEPENDENT_AMBULATORY_CARE_PROVIDER_SITE_OTHER): Payer: BC Managed Care – PPO | Admitting: Pulmonary Disease

## 2023-05-16 VITALS — BP 128/78 | HR 86 | Resp 16 | Ht 65.0 in | Wt 170.6 lb

## 2023-05-16 DIAGNOSIS — Z7189 Other specified counseling: Secondary | ICD-10-CM

## 2023-05-16 DIAGNOSIS — J440 Chronic obstructive pulmonary disease with acute lower respiratory infection: Secondary | ICD-10-CM | POA: Diagnosis not present

## 2023-05-16 DIAGNOSIS — J209 Acute bronchitis, unspecified: Secondary | ICD-10-CM

## 2023-05-16 DIAGNOSIS — Z7952 Long term (current) use of systemic steroids: Secondary | ICD-10-CM

## 2023-05-16 DIAGNOSIS — J9611 Chronic respiratory failure with hypoxia: Secondary | ICD-10-CM

## 2023-05-16 DIAGNOSIS — J4550 Severe persistent asthma, uncomplicated: Secondary | ICD-10-CM

## 2023-05-16 MED ORDER — ALBUTEROL SULFATE (2.5 MG/3ML) 0.083% IN NEBU
2.5000 mg | INHALATION_SOLUTION | Freq: Four times a day (QID) | RESPIRATORY_TRACT | 2 refills | Status: DC | PRN
Start: 2023-05-16 — End: 2023-10-10

## 2023-05-16 MED ORDER — SPIRIVA RESPIMAT 1.25 MCG/ACT IN AERS: 2.0000 | INHALATION_SPRAY | Freq: Every day | RESPIRATORY_TRACT | 5 refills | Status: DC

## 2023-05-16 NOTE — Progress Notes (Signed)
Subjective:   PATIENT ID: [name] GENDER: female DOB: 06-Sep-1971, MRN: [id number]   HPI  Chief Complaint  Patient presents with   Follow-up    Has been in hospital 4 times for sepsis- was moved to hospice house but graduated back to palliative care. Murmur is worse and she is in pain all the time. Stage 3a renal failure.    Reason for Visit: Follow-up  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  Synopsis: Previously followed at [facility] for cough variant asthma, subcentimeter lung nodules (stable) and chronic hypoxemia of unknown etiology. Patient had previously had concerns for pulmonary fibrosis however after no evidence of parenchymal disease on chest imaging. 2020 - Initially consulted with [facility] for hemoptysis. Work-up including bronchoscopy and CTA were negative and she has not had significant recurrence since then except mild episodes during exacerbations. Started on Nucala in 09/2019 for asthma however lost to follow-up from December 2020 to April 2021 2021 - Multiple hospitalizations including for kidney stone pain, migraine, bacteremia 2/2 port infection, LUE cellulitis 2022 - Re-established care and scheduled to start Nucala however unable to start. Had COVID and received antibody infusion 2023 - Had 3 hospital admissions including line associated bacteremia requiring port removal, readmission with sepsis, AKI and recently pneumonia in  October  10/15/22 Since our last visit she was hospitalized for cellulitis of the left hand in December. Required stress dose steroids in setting of adrenal insufficiency. Had tunneled placed for comfort and is seeing palliative care. She is compliant with her Advair and Spiriva, tolerating new inhaler well. Compliant with home O2. Chest vest was received and she reports using it three times a day with improved sputum production and breathing after use.  05/16/23 Since our last visit she was seen by palliative and enrolled into hospice for symptom management and then was discharged in April after deeming her life expectancy was >6 months after April evaluation. She was hospitalized in April for fevers with negative work-up. She was hospitalized again in June for respiratory failure from presumed pneumonia with improvement in 48 hours. Recently hospitalized in July for nausea and vomiting and weakness for possible adrenal crisis and sepsis secondary to bacillus bacteremia. ID recommended port removal however patient declined. She reports her pain has been less controlled since she is no longer in hospice and feels her overall quality of life has declined due to this. She was considered in re-enrollment for hospice on 03/22/23 and reports that she prefers to avoid hospitalizations however no further notes after this.   She reports her shortness of breath and feels albuterol with partial improvement 4-5 times and nebulizers once a night. Her symptoms worsen at night and will need nebulizer at night. Uses chest vest intermittently and trying to use it more often. Reports difficulty swallowing and weight loss.   Prior Inhalers: [name] (cost too high) Advair - current  Social History: Has two adult daughters Cares for her teenage son with atypical cerebral palsy  Environmental exposures:  Hx chemotherapy  CareEverywhere Records Reviewed and Summarized: Pulm  at [facility]      09/18/17 Bronchoscopy  with BAL. Negative cultures and negative cytology.      Dr. Cheri Rous documented on 01/31/18 that patient's chest imaging was presented in 10/2017 and her bronchial fibroelastotic scar was felt to be a nonspecific injury related to reflux or prior XRT and not related to her chronic cough. 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  recurrence. Currently has monthly port flushes.      The note also comments on PCP work-up comments on patient reporting hematuria with UA showing 1+ blood however this is not seen on recent PCP note.  IM at [facility] -       Per  PCP note on 03/17/19, patient previously seen by Cardiology for CHF with right heart failure and pH with EF 35% however EF normalized on echo 11/2018  Past Medical History:  Diagnosis Date   Addison's disease [facility])    Adrenal insufficiency (HCC)    Anemia    Anxiety    Aortic stenosis    Aortic stenosis    Appendicitis 12/19/09   Appendicitis    Breast cancer (HCC)    STATUS POST BILATERAL MASTECTOMY. STATUS POST RECONSTRUCTION. SHE HAD SILICONE BREAST IMPLANTS AND THE LEFT IMPLANT IS LEAKING SLIGHTLY   Cellulitis of right middle finger 11/07/2018   Cervical cancer (HCC) 12/23/2018   Chest pain    CHF with right heart failure (HCC) 04/17/2017   Chronic respiratory failure with hypoxia (HCC) 12/23/2018   Cough variant asthma 04/13/2019   Depression    GERD (gastroesophageal reflux disease)    takes Dexilant and carafate and gi coctail    Headache    migraines on a daily and monthly regimen    Heart murmur    History of kidney stones    Hodgkin lymphoma (HCC)    STATUS POST MANTLE RADIATION   Hodgkin's lymphoma (HCC)    1987   Hypertension    Hypoxia    Necrotizing fasciitis (HCC) 12/23/2018   Non-ischemic cardiomyopathy (HCC)    Osteoporosis    Palpitations    Pituitary adenoma (HCC) 12/23/2018   Pneumonia    PONV (postoperative nausea and vomiting)     Pre-diabetes    per pt; no meds   Pulmonary hypertension (HCC) 12/23/2018   Raynaud phenomenon    Right heart failure (HCC) 04/17/2017   Seizures (HCC)    last febrile seizure was approx 3 weeks ago per report on 12/01/2020   Sleep apnea    upcoming sleep study per pt    Supplemental oxygen dependent    3 liters   SVT (supraventricular tachycardia)    Tachycardia    Thyroid cancer (HCC)    STATUS POST SURGICAL REMOVAL-CURRENT ON THYROID REPLACEMENT     Allergies  Allergen Reactions   Ferrous Bisglycinate Chelate [Iron] Anaphylaxis and Other (See Comments)    Only IV - only FERRLICET   Mushroom Extract Complex Anaphylaxis   Na Ferric Gluc Cplx In Sucrose Anaphylaxis   Cymbalta [Duloxetine Hcl] Swelling and Anxiety   Hydromorphone Other (See Comments)    BP drop and heart rate drops 5.1.20 PT REPORTS THAT SHE TAKES DILAUDID AT HOME   Ondansetron Hcl Other (See Comments)   Promethazine Other (See Comments)    Other reaction(s): Unknown   Succinylcholine Other (See Comments)    Lock Jaw   Buprenorphine Hcl Hives   Compazine  Other (See Comments)    Altered mental status Aggression   Duloxetine     Other reaction(s): Not available   Metoclopramide Other (See Comments)    Dystonia   Morphine And Codeine Hives   Ondansetron Hives and Rash    Other reaction(s): Unknown Other reaction(s): Unknown   Promethazine Hcl Hives   Tegaderm Ag Mesh [Silver] Rash    Old formulation only, is able to tolerate new formulation     Outpatient Medications Prior to Visit  Medication Sig Dispense Refill   albuterol (PROVENTIL) (2.5 MG/3ML) 0.083% nebulizer solution Take 3 mLs (2.5 mg total) by nebulization every 6 (six) hours as needed for wheezing or shortness of breath. 360 mL 2   ALPRAZolam (XANAX) 1 MG tablet Take 1 tablet (1 mg total) by mouth daily. 30 tablet 2   aspirin EC 81 MG tablet Take 1 tablet (81 mg total) by mouth daily. Swallow whole. (Patient taking differently: Take 81 mg by  mouth at bedtime. Chewable) 90 tablet 3   bumetanide (BUMEX) 1 MG tablet TAKE 1 TABLET BY MOUTH TWICE A DAY (Patient taking differently: Take 1 mg by mouth 2 (two) times daily.) 180 tablet 3   buPROPion (WELLBUTRIN XL) 150 MG 24 hr tablet TAKE 1 TABLET BY MOUTH EVERY DAY 90 tablet 0   clonazePAM (KLONOPIN) 1 MG tablet Take 1 tablet (1 mg total) by mouth 2 (two) times daily. (Patient taking differently: Take 0.25 mg by mouth in the morning, at noon, in the evening, and at bedtime.) 60 tablet 2   diphenhydrAMINE (BENADRYL) 12.5 MG/5ML elixir Take 10 mLs (25 mg total) by mouth every 6 (six) hours as needed (nausea). 120 mL 0   EPINEPHRINE 0.3 mg/0.3 mL IJ SOAJ injection INJECT 0.3 MG INTO THE MUSCLE AS NEEDED FOR ANAPHYLAXIS. 2 each 1   escitalopram (LEXAPRO) 20 MG tablet TAKE 1 TABLET BY MOUTH EVERY DAY IN THE EVENING (Patient taking differently: Take 20 mg by mouth daily in the afternoon. Afternoon) 90 tablet 1   famotidine (PEPCID) 40 MG/5ML suspension Take 2.5 mLs (20 mg total) by mouth daily. 75 mL 0   Heparin Na, Pork, Lock Flsh PF (BD HEPARIN POSIFLUSH) 100 UNIT/ML SOLN USE 5 MLS IN PORT A CATH ONCE DAILY AFTER MEDICATION ADMINISTRATION AS A HEPLOCK AS DIRECTED. 150 mL 3   hydrocortisone (CORTEF) 10 MG tablet Take 1-2 tablets (10-20 mg total) by mouth See admin instructions. Take 20 mg in the am and 10mg  in the evening. Take after completing prednisone (Patient taking differently: Take 10-20 mg by mouth See admin instructions. Take 20 mg in the am at 10 am and 10 mg in the afternoon. Take after completing prednisone)     lamoTRIgine (LAMICTAL) 200 MG tablet TAKE 1 TABLET BY MOUTH EVERYDAY AT BEDTIME 90 tablet 0   lamoTRIgine (LAMICTAL) 25 MG tablet TAKE 1 TABLET (25 MG TOTAL) BY MOUTH DAILY. 90 tablet 0   Lancets (ONETOUCH DELICA PLUS LANCET33G) MISC 3 (three) times daily. for testing     lansoprazole (PREVACID) 15 MG capsule Take 30 mg by mouth daily at 12 noon.     LORazepam (ATIVAN) 0.5 MG  tablet Take 1 tablet (0.5 mg total) by mouth 3 (three) times daily as needed for anxiety. (Patient taking differently: Place 0.5 mg into feeding tube 3 (three) times daily as needed for anxiety. Access Port For Nausea) 12 tablet 0   nitroGLYCERIN (NITROSTAT) 0.4 MG SL tablet DISSOLVE 1 TAB UNDER THE TONGUE EVERY 5 MINUTES AS  NEEDED FOR CHEST PAIN. MAX OF 3 DOSES, THEN 911. 75 tablet 1   ONETOUCH VERIO test strip 3 (three) times daily. for testing     Oxycodone HCl 10 MG TABS Take 1 tablet by mouth every 6 (six) hours as needed (pain).     OXYGEN Inhale 4-5 L/min into the lungs continuous.     PROAIR HFA 108 (90 Base) MCG/ACT inhaler Inhale 2 puffs into the lungs every 4 (four) hours as needed for wheezing or shortness of breath.     rosuvastatin (CRESTOR) 10 MG tablet Take 10 mg by mouth in the morning. Mid morning     Sodium Chloride Flush (NORMAL SALINE FLUSH) 0.9 % SOLN Use as directed for central venous catheter maintenance 600 mL 3   Sodium Chloride Flush (NORMAL SALINE FLUSH) 0.9 % SOLN Use as directed for central venous catheter maintenance 600 mL 3   sucralfate (CARAFATE) 1 GM/10ML suspension Take 1 g by mouth daily as needed (as directed for ulcers).     SYNTHROID 100 MCG tablet Take 1 tablet (100 mcg total) by mouth every morning. (Patient taking differently: Take 100 mcg by mouth every morning. * Must be name brand*)     Tiotropium Bromide Monohydrate (SPIRIVA RESPIMAT) 1.25 MCG/ACT AERS Inhale 2 puffs into the lungs daily. 4 g 5   topiramate (TOPAMAX) 50 MG tablet Take 150 mg by mouth at bedtime.      zolpidem (AMBIEN CR) 12.5 MG CR tablet Take 12.5 mg by mouth at bedtime.     zolpidem (AMBIEN CR) 12.5 MG CR tablet TAKE ONE TABLET BY MOUTH AT BEDTIME AS NEEDED FOR SLEEP. 90 tablet 0   Bismuth Tribromoph-Petrolatum (XEROFORM OCCLUSIVE GAUZE STRIP) PADS Apply 1 each topically as directed. (Patient not taking: Reported on 05/16/2023) 50 each 1   EMGALITY 120 MG/ML SOSY Inject 120 mg into the  skin every 28 (twenty-eight) days. (Patient not taking: Reported on 05/16/2023)     FLORASTOR 250 MG capsule Take 250 mg by mouth 2 (two) times daily. Mid Morning and Mid Afternoon     fluticasone-salmeterol (ADVAIR HFA) 230-21 MCG/ACT inhaler INHALE 2 PUFFS INTO THE LUNGS TWICE A DAY (Patient not taking: Reported on 04/22/2023) 12 each 5   hydrOXYzine (ATARAX) 25 MG tablet Take 1 tablet (25 mg total) by mouth every 6 (six) hours as needed for anxiety. (Patient not taking: Reported on 05/16/2023) 30 tablet 0   Rimegepant Sulfate (NURTEC) 75 MG TBDP Take 75 mg by mouth daily as needed (Migraine).  (Patient not taking: Reported on 05/16/2023)     silver sulfADIAZINE (SILVADENE) 1 % cream Apply to affected area daily (Patient taking differently: Apply 1 Application topically daily as needed.) 50 g 1   dexlansoprazole (DEXILANT) 60 MG capsule Take 60 mg by mouth in the morning. (Patient not taking: Reported on 05/16/2023)     No facility-administered medications prior to visit.    Review of Systems  Constitutional:  Negative for chills, diaphoresis, fever, malaise/fatigue and weight loss.  HENT:  Negative for congestion.   Respiratory:  Positive for cough, sputum production and shortness of breath. Negative for hemoptysis and wheezing.   Cardiovascular:  Negative for chest pain, palpitations and leg swelling.    Objective:   Vitals:   05/16/23 1441  BP: 128/78  Pulse: 86  Resp: 16  SpO2: 99%  Weight: 170 lb 9.6 oz (77.4 kg)  Height: 5\' 5"  (1.651 m)      Physical Exam: General: Chronically ill-appearing, no acute distress  HENT: [place], AT Eyes: EOMI, no scleral icterus Respiratory: Clear to auscultation bilaterally.  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 No infiltrate, edema or effusion. CT 05/05/09 - Normal parenchymal. Scattered tiny pulmonary nodules with largest 3mm in LUL likely benign CT Chest 09/05/17 (report only): Stable bilateral pulmonary nodules up to 4mm (LUL 3mm, LUL 2 mm, RLL subpleural 4mm), redemonstration of tiny right diaphragmatic hernia containing fat CT Chest 11/10/18 - RML 5 mm lung nodule, unchanged CT Chest HR 04/04/20 - Mild air trapping. Air trapping on expiration. No ILD. CT Chest 08/13/22 - Air trapping. 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 Subcentimeter nodules <5 mm. CT CAP 01/23/23 - No acute abnormalities, trace bilateral pleural effusions, 11 mm indeterminate cortical hypodensity within the posterior left interpolar region.  PFT: 04/16/19 FVC 2.42 (65%) FEV1 2.03 (68%) Ratio 76  TLC 84% DLCO 75% Interpretation: Moderate reversible obstructive defect. Significant BD response in FEV1.  CPET Per [facility] records  CPET 03/2016 showed functional impairment with VO7max 65% predicted. Normal oxygenation throughout on room air with post-exercise PaO2=125. HR 82% predicted. Abnormal FEV1 but ventilatory reserve remained. Conclusion: Cardiovascular limitation to exercise possibly reflecting deconditioning. Ventilatory impairment present but not exercise-limiting.  6 min walk  Per [facility] records (12/07/16): SpO2 started at 97-98% and decreased to ~94% with ambulation. There was one episode of symptomatic dizziness during which SpO2 was 88% but with questionable tracing. Recovered to high-90s with ~10-15 seconds of standing rest break.  [facility] 08/13/19  Unable to complete . Patient had episode of coughing while walking and dizziness resulting in witnessed fall. Staff reported patient was disoriented and confused.  POC glucose was 109. Pulse oximetry >96% and tachycardia 110. No desaturations documented during this time.  Ambulatory O2 Titration 04/13/19 - Patient unable to complete due to  dizziness with ambulation. No desaturations documented. SpO2>95% in the time titration was attempted on room air. Per nursing, patient anxious and holding breath during testing which likely contributed to early termination of test  Labs:  ANA Screen 07/03/17 - Positive  Feb 2020 ANA - neg SSA/SSB - neg Anti-DNA ab -neg MPO/PR3 ab - ng GBM ab - neg Anti-scleroderma ab - neg APLAS - neg ANCA titers - neg RF mildly elevated 18.6  Bronchoscopy: 11/19/18   11/19/18 TBFNA station 7 (mislabeled as bronchial brushing) - negative for malignant cells  11/19/18 Respiratory/fungal, AFB from BAL of L lingula - negative    Assessment & Plan:   Discussion: 52 year old female with complex medical history as noted below. Previously followed at [facility] for cough variant asthma, subcentimeter lung nodules (stable) and chronic hypoxemic of unknown etiology (normal lung parenchyma by biopsy and chest imaging).  She was initially established with [facility] for hemoptysis. Work-up including bronchoscopy and CTA were negative and she has not had significant recurrence since then except mild episodes during exacerbations. For her asthma she was started on Nucala in 09/2019. Lost to follow-up and re-established care in 2022. Attempted to restart Nucala however she had medical issues preventing restart.  Recent multiple hospitalizations including pneumonia in October 2023 and Dec 2023. Recently had April, June and July has well for multiple reasons including sepsis of unknown origin, respiratory failure, sepsis secondary to bacillus, concerning for contaminant. She had declined port removal due to goals  of care.  Her goal is for symptom management. Currently enrolled in Palliative Care. Patient has been advised to follow-up with pain management and has not been established.  Dyspnea of breath secondary muscular deconditioning Moderate persistent eosinophilic asthma Chronic bronchitis, failed flutter  valve and 3% saline, improved on chest vest Probable diaphragm weakness in setting of deconditioning related to uncontrolled lung diseae - poor inspiratory and expiratory effort with persistent air trapping on imaging despite current bronchodilator therapy. Likely contributes to her inability to clear secretions well --CONTINUE Advair 230-21 mcg TWO puff TWICE a day --CONTINUE Spiriva 1.25 mcg TWO puffs a day. This will replace Incruse --CONTINUE Albuterol AS NEEDED for shortness of breath or wheezing --Continue chest vest up to three times a day  Chronic hypoxemic respiratory failure Currently wearing 4L at home. This is self-purchased --CONTINUE supplemental oxygen for goal greater than >88% --Try to give yourself a break on lower oxygen levels as long as it is above 88%.  Right heart failure Anasarca --Diuresis per Cardiology/Heart failure team  DNR/DNI --Will contact [facility] with current status --Recommend continued support from your PCP for goals of care  Health Maintenance Immunization History  Administered Date(s) Administered   Fluad Quad(high Dose 65+) 06/30/2020   Influenza, High Dose Seasonal PF 06/19/2019   Influenza, Quadrivalent, Recombinant, Inj, Pf 08/13/2018   Influenza, Seasonal, Injecte, Preservative Fre 06/23/2016, 11/12/2017, 08/13/2018   Influenza,inj,Quad PF,6+ Mos 07/04/2015, 07/04/2015, 06/23/2016, 07/03/2017, 11/12/2017, 08/13/2018   Influenza,inj,Quad PF,6-35 Mos 07/04/2015   Influenza,inj,quad, With Preservative 06/19/2019   Influenza-Unspecified 07/23/2007, 07/06/2008, 06/13/2009, 07/06/2010, 07/25/2011, 08/01/2012, 07/14/2013, 07/04/2015, 07/04/2015, 06/23/2016, 06/23/2016, 07/03/2017, 11/12/2017, 06/19/2019, 06/30/2020, 06/01/2021   Moderna Sars-Covid-2 Vaccination 12/15/2019, 01/12/2020   Pneumococcal Conjugate PCV 7 03/20/2019   Pneumococcal Conjugate-13 03/20/2019   Pneumococcal Polysaccharide-23 02/21/2017   Pneumococcal-Unspecified 02/21/2017    Td 12/19/2010   Td (Adult), 2 Lf Tetanus Toxid, Preservative Free 12/19/2010   Varicella 04/02/2007   CT Lung Screen - not qualified. Never smoker  No orders of the defined types were placed in this encounter.  Meds ordered this encounter  Medications   albuterol (PROVENTIL) (2.5 MG/3ML) 0.083% nebulizer solution    Sig: Take 3 mLs (2.5 mg total) by nebulization every 6 (six) hours as needed for wheezing or shortness of breath.    Dispense:  360 mL    Refill:  2   Tiotropium Bromide Monohydrate (SPIRIVA RESPIMAT) 1.25 MCG/ACT AERS    Sig: Inhale 2 puffs into the lungs daily.    Dispense:  4 g    Refill:  5   Return in about 4 months (around 09/15/2023) for for 30 min slot.  I have spent a total time of 45-minutes on the day of the appointment including chart review, data review, collecting history, coordinating care and discussing medical diagnosis and plan with the patient/family. Past medical history, allergies, medications were reviewed. Pertinent imaging, labs and tests included in this note have been reviewed and interpreted independently by me.   Mechele Collin, MD [facility] 05/16/2023 2:42 PM  Office Number [phone]

## 2023-05-16 NOTE — Patient Instructions (Addendum)
Moderate persistent eosinophilic asthma, Chronic bronchitis, failed flutter valve and 3% saline, improved on chest vest Probable diaphragm weakness in setting of deconditioning related to uncontrolled lung diseae - poor inspiratory and expiratory effort with persistent air trapping on imaging despite current bronchodilator therapy. Likely contributes to her inability to clear secretions well --CONTINUE Advair 230-21 mcg TWO puff TWICE a day --CONTINUE Spiriva 1.25 mcg TWO puffs a day. This will replace Incruse --CONTINUE Albuterol AS NEEDED for shortness of breath or wheezing --Continue chest vest up to three times a day  Chronic hypoxemic respiratory failure Currently wearing 4L at home.  --CONTINUE supplemental oxygen for goal greater than >88% --Try to give yourself a break on lower oxygen levels as long as it is above 88%.  DNR/DNI --Will contact [facility] with current status --Recommend continued support from your PCP for goals of care

## 2023-05-21 DIAGNOSIS — G43009 Migraine without aura, not intractable, without status migrainosus: Secondary | ICD-10-CM | POA: Diagnosis not present

## 2023-05-21 DIAGNOSIS — F445 Conversion disorder with seizures or convulsions: Secondary | ICD-10-CM | POA: Diagnosis not present

## 2023-05-22 ENCOUNTER — Other Ambulatory Visit (HOSPITAL_COMMUNITY): Payer: Self-pay

## 2023-05-27 ENCOUNTER — Encounter: Payer: Self-pay | Admitting: Hematology and Oncology

## 2023-05-27 ENCOUNTER — Other Ambulatory Visit (HOSPITAL_COMMUNITY): Payer: Self-pay

## 2023-05-27 DIAGNOSIS — H35363 Drusen (degenerative) of macula, bilateral: Secondary | ICD-10-CM | POA: Diagnosis not present

## 2023-05-27 DIAGNOSIS — H5213 Myopia, bilateral: Secondary | ICD-10-CM | POA: Diagnosis not present

## 2023-05-27 DIAGNOSIS — H53453 Other localized visual field defect, bilateral: Secondary | ICD-10-CM | POA: Diagnosis not present

## 2023-05-28 ENCOUNTER — Encounter: Payer: Self-pay | Admitting: Adult Health

## 2023-05-28 ENCOUNTER — Telehealth (INDEPENDENT_AMBULATORY_CARE_PROVIDER_SITE_OTHER): Payer: BC Managed Care – PPO | Admitting: Adult Health

## 2023-05-28 ENCOUNTER — Other Ambulatory Visit (HOSPITAL_COMMUNITY): Payer: Self-pay

## 2023-05-28 DIAGNOSIS — F331 Major depressive disorder, recurrent, moderate: Secondary | ICD-10-CM | POA: Diagnosis not present

## 2023-05-28 DIAGNOSIS — F410 Panic disorder [episodic paroxysmal anxiety] without agoraphobia: Secondary | ICD-10-CM | POA: Diagnosis not present

## 2023-05-28 DIAGNOSIS — F411 Generalized anxiety disorder: Secondary | ICD-10-CM

## 2023-05-28 DIAGNOSIS — G4700 Insomnia, unspecified: Secondary | ICD-10-CM | POA: Diagnosis not present

## 2023-05-28 MED ORDER — LAMOTRIGINE 25 MG PO TABS
ORAL_TABLET | ORAL | 0 refills | Status: DC
Start: 2023-05-28 — End: 2023-08-01

## 2023-05-28 NOTE — Progress Notes (Signed)
[name] [id number] 16-Sep-1971 52 y.o.  Subjective:   Patient ID:  [name] is a 52 y.o. (DOB 09/07/1971) female.  Chief Complaint: No chief complaint on file.   HPI [name] presents to the office today for follow-up of MDD, GAD, insomnia, and panic attacks.  Accompanied by husband.  Describes mood today as "not good". Pleasant. Tearful. Mood symptoms - reports depression, anxiety and irritability. Reports feeling angry - "it's taking over". Reports panic attacks. Reports worry, rumination, and over thinking. Mood is lower. Stating "I'm not doing much better". Reports health continues to decline - possible admission to the hospital this afternoon. Family supportive. Decreased interest and motivation. Taking medications as prescribed. Working with [name] for therapy.  Energy levels low. Active, does not have a regular exercise routine with current physical disabilities. Unable to enjoy usual interests and activities. Married. Lives with husband and son. Has a daughter - 1 in [place]. Spending time with family.  Appetite adequate. Weight fluctuates 162 pounds. Sleeps better some nights than others - reports broken sleep. Focus and concentration difficulties. Completing tasks. Managing aspects of household. Disabled since 2016. Denies SI or HI.  Denies AH or VH. Denies self harm. Denies substance use.   Previous medication trials: Wellbutrin - mean, Zoloft-not helpful    PHQ2-9    Flowsheet Row Office Visit from 06/14/2022 in [facility] for Infectious Disease Office Visit from 03/02/2022 in [facility] for Infectious Disease Office Visit from 11/22/2021 in [facility] for Infectious Disease Office Visit from 11/16/2021 in [facility] for Infectious Disease  PHQ-2 Total Score 1 4 2 2   PHQ-9 Total Score -- 19 2 3       Flowsheet Row ED to Hosp-Admission (Discharged) from 04/22/2023 in [facility] ED from 03/30/2023 in [facility] Emergency Department at [facility] ED to Hosp-Admission (Discharged) from 03/09/2023 in [facility]  C-SSRS RISK CATEGORY No Risk No Risk No Risk        Review of Systems:  Review of Systems  Musculoskeletal:  Negative for gait problem.  Neurological:  Negative for tremors.  Psychiatric/Behavioral:         Please refer to HPI    Medications: I have reviewed the patient's current medications.  Current Outpatient Medications  Medication Sig Dispense Refill   albuterol (PROVENTIL) (2.5 MG/3ML) 0.083% nebulizer solution Take 3 mLs (2.5 mg total) by nebulization every 6 (six) hours as needed for wheezing or shortness of breath. 360 mL 2   ALPRAZolam (XANAX) 1 MG tablet Take 1 tablet (1 mg total) by mouth daily. 30 tablet 2   aspirin EC 81 MG tablet Take 1 tablet (81 mg total) by mouth daily. Swallow whole. (Patient taking differently: Take 81 mg by mouth at bedtime. Chewable) 90 tablet 3   Bismuth Tribromoph-Petrolatum (XEROFORM OCCLUSIVE GAUZE STRIP) PADS Apply 1 each topically as directed. (Patient not taking: Reported on 05/16/2023) 50 each 1   bumetanide (BUMEX) 1 MG tablet TAKE 1 TABLET BY MOUTH TWICE A DAY (Patient taking differently: Take 1 mg by mouth 2 (two) times daily.) 180 tablet 3   buPROPion (WELLBUTRIN XL) 150 MG 24 hr tablet TAKE 1 TABLET BY MOUTH EVERY DAY 90 tablet 0   clonazePAM (KLONOPIN) 1 MG tablet Take 1 tablet (1 mg total) by mouth 2 (two) times daily. (Patient taking differently: Take 0.25 mg by mouth in the morning,  at noon, in the evening, and at bedtime.) 60 tablet 2   diphenhydrAMINE (BENADRYL) 12.5 MG/5ML elixir Take 10 mLs (25 mg total) by mouth every 6 (six) hours as needed (nausea). 120 mL 0   EMGALITY 120 MG/ML SOSY Inject 120 mg into the skin every 28 (twenty-eight) days. (Patient not taking: Reported on 05/16/2023)     EPINEPHRINE 0.3 mg/0.3 mL IJ SOAJ injection INJECT 0.3  MG INTO THE MUSCLE AS NEEDED FOR ANAPHYLAXIS. 2 each 1   escitalopram (LEXAPRO) 20 MG tablet TAKE 1 TABLET BY MOUTH EVERY DAY IN THE EVENING (Patient taking differently: Take 20 mg by mouth daily in the afternoon. Afternoon) 90 tablet 1   famotidine (PEPCID) 40 MG/5ML suspension Take 2.5 mLs (20 mg total) by mouth daily. 75 mL 0   FLORASTOR 250 MG capsule Take 250 mg by mouth 2 (two) times daily. Mid Morning and Mid Afternoon     fluticasone-salmeterol (ADVAIR HFA) 230-21 MCG/ACT inhaler INHALE 2 PUFFS INTO THE LUNGS TWICE A DAY (Patient not taking: Reported on 04/22/2023) 12 each 5   Heparin Na, Pork, Lock Flsh PF (BD HEPARIN POSIFLUSH) 100 UNIT/ML SOLN USE 5 MLS IN PORT A CATH ONCE DAILY AFTER MEDICATION ADMINISTRATION AS A HEPLOCK AS DIRECTED. 150 mL 3   hydrocortisone (CORTEF) 10 MG tablet Take 1-2 tablets (10-20 mg total) by mouth See admin instructions. Take 20 mg in the am and 10mg  in the evening. Take after completing prednisone (Patient taking differently: Take 10-20 mg by mouth See admin instructions. Take 20 mg in the am at 10 am and 10 mg in the afternoon. Take after completing prednisone)     hydrOXYzine (ATARAX) 25 MG tablet Take 1 tablet (25 mg total) by mouth every 6 (six) hours as needed for anxiety. (Patient not taking: Reported on 05/16/2023) 30 tablet 0   lamoTRIgine (LAMICTAL) 200 MG tablet TAKE 1 TABLET BY MOUTH EVERYDAY AT BEDTIME 90 tablet 0   lamoTRIgine (LAMICTAL) 25 MG tablet Take two tablets every morning. 180 tablet 0   Lancets (ONETOUCH DELICA PLUS LANCET33G) MISC 3 (three) times daily. for testing     lansoprazole (PREVACID) 15 MG capsule Take 30 mg by mouth daily at 12 noon.     LORazepam (ATIVAN) 0.5 MG tablet Take 1 tablet (0.5 mg total) by mouth 3 (three) times daily as needed for anxiety. (Patient taking differently: Place 0.5 mg into feeding tube 3 (three) times daily as needed for anxiety. Access Port For Nausea) 12 tablet 0   nitroGLYCERIN (NITROSTAT) 0.4 MG SL  tablet DISSOLVE 1 TAB UNDER THE TONGUE EVERY 5 MINUTES AS NEEDED FOR CHEST PAIN. MAX OF 3 DOSES, THEN 911. 75 tablet 1   ONETOUCH VERIO test strip 3 (three) times daily. for testing     Oxycodone HCl 10 MG TABS Take 1 tablet by mouth every 6 (six) hours as needed (pain).     OXYGEN Inhale 4-5 L/min into the lungs continuous.     PROAIR HFA 108 (90 Base) MCG/ACT inhaler Inhale 2 puffs into the lungs every 4 (four) hours as needed for wheezing or shortness of breath.     Rimegepant Sulfate (NURTEC) 75 MG TBDP Take 75 mg by mouth daily as needed (Migraine).  (Patient not taking: Reported on 05/16/2023)     rosuvastatin (CRESTOR) 10 MG tablet Take 10 mg by mouth in the morning. Mid morning     silver sulfADIAZINE (SILVADENE) 1 % cream Apply to affected area daily (Patient taking differently: Apply 1 Application  topically daily as needed.) 50 g 1   Sodium Chloride Flush (NORMAL SALINE FLUSH) 0.9 % SOLN Use as directed for central venous catheter maintenance 600 mL 3   Sodium Chloride Flush (NORMAL SALINE FLUSH) 0.9 % SOLN Use as directed for central venous catheter maintenance 600 mL 3   sucralfate (CARAFATE) 1 GM/10ML suspension Take 1 g by mouth daily as needed (as directed for ulcers).     SYNTHROID 100 MCG tablet Take 1 tablet (100 mcg total) by mouth every morning. (Patient taking differently: Take 100 mcg by mouth every morning. * Must be name brand*)     Tiotropium Bromide Monohydrate (SPIRIVA RESPIMAT) 1.25 MCG/ACT AERS Inhale 2 puffs into the lungs daily. 4 g 5   topiramate (TOPAMAX) 50 MG tablet Take 150 mg by mouth at bedtime.      zolpidem (AMBIEN CR) 12.5 MG CR tablet TAKE ONE TABLET BY MOUTH AT BEDTIME AS NEEDED FOR SLEEP. 90 tablet 0   No current facility-administered medications for this visit.    Medication Side Effects: None  Allergies:  Allergies  Allergen Reactions   Ferrous Bisglycinate Chelate [Iron] Anaphylaxis and Other (See Comments)    Only IV - only FERRLICET   Mushroom  Extract Complex Anaphylaxis   Na Ferric Gluc Cplx In Sucrose Anaphylaxis   Cymbalta [Duloxetine Hcl] Swelling and Anxiety   Hydromorphone Other (See Comments)    BP drop and heart rate drops 5.1.20 PT REPORTS THAT SHE TAKES DILAUDID AT HOME   Ondansetron Hcl Other (See Comments)   Promethazine Other (See Comments)    Other reaction(s): Unknown   Succinylcholine Other (See Comments)    Lock Jaw   Buprenorphine Hcl Hives   Compazine Other (See Comments)    Altered mental status Aggression   Duloxetine     Other reaction(s): Not available   Metoclopramide Other (See Comments)    Dystonia   Morphine And Codeine Hives   Ondansetron Hives and Rash    Other reaction(s): Unknown Other reaction(s): Unknown   Promethazine Hcl Hives   Tegaderm Ag Mesh [Silver] Rash    Old formulation only, is able to tolerate new formulation    Past Medical History:  Diagnosis Date   Addison's disease (HCC)    Adrenal insufficiency (HCC)    Anemia    Anxiety    Aortic stenosis    Aortic stenosis    Appendicitis 12/19/09   Appendicitis    Breast cancer (HCC)    STATUS POST BILATERAL MASTECTOMY. STATUS POST RECONSTRUCTION. SHE HAD SILICONE BREAST IMPLANTS AND THE LEFT IMPLANT IS LEAKING SLIGHTLY   Cellulitis of right middle finger 11/07/2018   Cervical cancer (HCC) 12/23/2018   Chest pain    CHF with right heart failure (HCC) 04/17/2017   Chronic respiratory failure with hypoxia (HCC) 12/23/2018   Cough variant asthma 04/13/2019   Depression    GERD (gastroesophageal reflux disease)    takes Dexilant and carafate and gi coctail    Headache    migraines on a daily and monthly regimen    Heart murmur    History of kidney stones    Hodgkin lymphoma (HCC)    STATUS POST MANTLE RADIATION   Hodgkin's lymphoma (HCC)    1987   Hypertension    Hypoxia    Necrotizing fasciitis (HCC) 12/23/2018   Non-ischemic cardiomyopathy (HCC)    Osteoporosis    Palpitations    Pituitary adenoma (HCC) 12/23/2018    Pneumonia    PONV (postoperative nausea and vomiting)  Pre-diabetes    per pt; no meds   Pulmonary hypertension (HCC) 12/23/2018   Raynaud phenomenon    Right heart failure (HCC) 04/17/2017   Seizures (HCC)    last febrile seizure was approx 3 weeks ago per report on 12/01/2020   Sleep apnea    upcoming sleep study per pt    Supplemental oxygen dependent    3 liters   SVT (supraventricular tachycardia)    Tachycardia    Thyroid cancer (HCC)    STATUS POST SURGICAL REMOVAL-CURRENT ON THYROID REPLACEMENT    Past Medical History, Surgical history, Social history, and Family history were reviewed and updated as appropriate.   Please see review of systems for further details on the patient's review from today.   Objective:   Physical Exam:  LMP  (LMP Unknown)   Physical Exam Constitutional:      General: She is not in acute distress. Musculoskeletal:        General: No deformity.  Neurological:     Mental Status: She is alert and oriented to person, place, and time.     Coordination: Coordination normal.  Psychiatric:        Attention and Perception: Attention and perception normal. She does not perceive auditory or visual hallucinations.        Mood and Affect: Affect is not labile, blunt, angry or inappropriate.        Speech: Speech normal.        Behavior: Behavior normal.        Thought Content: Thought content normal. Thought content is not paranoid or delusional. Thought content does not include homicidal or suicidal ideation. Thought content does not include homicidal or suicidal plan.        Cognition and Memory: Cognition and memory normal.        Judgment: Judgment normal.     Comments: Insight intact     Lab Review:     Component Value Date/Time   NA 139 04/25/2023 0317   NA 140 08/20/2022 1513   K 3.7 04/25/2023 0317   CL 107 04/25/2023 0317   CO2 25 04/25/2023 0317   GLUCOSE 107 (H) 04/25/2023 0317   BUN 17 04/25/2023 0317   BUN 28 (H) 08/20/2022 1513    CREATININE 0.84 04/25/2023 0317   CREATININE 1.08 (H) 02/09/2022 1202   CALCIUM 8.8 (L) 04/25/2023 0317   PROT 7.9 04/22/2023 1735   PROT 7.1 01/23/2018 1225   ALBUMIN 3.9 04/22/2023 1735   ALBUMIN 4.4 01/23/2018 1225   AST 22 04/22/2023 1735   AST 26 02/09/2022 1202   ALT 32 04/22/2023 1735   ALT 40 02/09/2022 1202   ALKPHOS 63 04/22/2023 1735   BILITOT 0.5 04/22/2023 1735   BILITOT 0.4 02/09/2022 1202   GFRNONAA >60 04/25/2023 0317   GFRNONAA >60 02/09/2022 1202   GFRAA >60 04/27/2020 0613   GFRAA >60 02/10/2020 1020       Component Value Date/Time   WBC 10.0 04/25/2023 0317   RBC 3.24 (L) 04/25/2023 0317   HGB 7.7 (L) 04/25/2023 0317   HGB 10.6 (L) 08/20/2022 1513   HCT 26.7 (L) 04/25/2023 0317   HCT 34.4 08/20/2022 1513   PLT 337 04/25/2023 0317   PLT 414 08/20/2022 1513   MCV 82.4 04/25/2023 0317   MCV 78 (L) 08/20/2022 1513   MCH 23.8 (L) 04/25/2023 0317   MCHC 28.8 (L) 04/25/2023 0317   RDW 17.4 (H) 04/25/2023 0317   RDW 16.5 (H) 08/20/2022 1513  LYMPHSABS 2.8 04/25/2023 0317   MONOABS 0.9 04/25/2023 0317   EOSABS 0.2 04/25/2023 0317   BASOSABS 0.1 04/25/2023 0317    No results found for: "POCLITH", "LITHIUM"   No results found for: "PHENYTOIN", "PHENOBARB", "VALPROATE", "CBMZ"   .res Assessment: Plan:    Plan:  PDMP reviewed  Lexapro 20mg  daily Clonazepam 0.5mg  - 4 times daily. Xanax 1mg  daily as needed for extreme anxiety. Wellbutrin XL 150mg  every morning - did not tolerate increase in dose - reports seizure history Lamictal 200mg  daily  Increase Lamictal 25mg  to 50mg  every morning  Seeing [name] therapy visits.  Discussed potential benefits, risk, and side effects of benzodiazepines to include potential risk of tolerance and dependence, as well as possible drowsiness.  Advised patient not to drive if experiencing drowsiness and to take lowest possible effective dose to minimize risk of dependence and tolerance.   Counseled patient  regarding potential benefits, risks, and side effects of Lamictal to include potential risk of Stevens-Johnson syndrome. Advised patient to stop taking Lamictal and contact office immediately if rash develops and to seek urgent medical attention if rash is severe and/or spreading quickly.  RTC 3 to 4 weeks  Patient advised to contact office with any questions, adverse effects, or acute worsening in signs and symptoms.  Diagnoses and all orders for this visit:  Major depressive disorder, recurrent episode, moderate (HCC) -     lamoTRIgine (LAMICTAL) 25 MG tablet; Take two tablets every morning.  Generalized anxiety disorder  Panic attacks  Insomnia, unspecified type     Please see After Visit Summary for patient specific instructions.  Future Appointments  Date Time Provider Department Center  05/30/2023  4:00 PM Mathis Fare, [place] CP-[facility] None  06/14/2023  9:00 AM Mathis Fare, LCSW CP-CP None  06/28/2023  9:00 AM Mathis Fare, LCSW CP-[facility] None  07/12/2023  9:00 AM Mathis Fare, LCSW CP[facility] None    No orders of the defined types were placed in this encounter.   -------------------------------

## 2023-05-29 ENCOUNTER — Ambulatory Visit (INDEPENDENT_AMBULATORY_CARE_PROVIDER_SITE_OTHER): Payer: BC Managed Care – PPO | Admitting: Psychiatry

## 2023-05-29 DIAGNOSIS — F331 Major depressive disorder, recurrent, moderate: Secondary | ICD-10-CM

## 2023-05-29 NOTE — Progress Notes (Signed)
[facility] Counselor/Therapist Progress Note  Patient ID: [name], MRN: [id number],    Date: 05/29/2023  Time Spent: 48 minutes   Treatment Type: Individual Therapy   Virtual Visit via Telehealth Note: MyChart Video session Connected with patient by a telemedicine/telehealth application, with their informed consent, and verified patient privacy and that I am speaking with the correct person using two identifiers. I discussed the limitations, risks, security and privacy concerns of performing psychotherapy and the availability of in person appointments. I also discussed with the patient that there may be a patient responsible charge related to this service. The patient expressed understanding and agreed to proceed. I discussed the treatment planning with the patient. The patient was provided an opportunity to ask questions and all were answered. The patient agreed with the plan and demonstrated an understanding of the instructions. The patient was advised to call  our office if  symptoms worsen or feel they are in a crisis state and need immediate contact.   Therapist Location: office Patient Location: home  Reported Symptoms: anxiety, depression, anger (mostly towards others whose "lives seem to be going on as normal")   Mental Status Exam:  Appearance:   Casual     Behavior:  Appropriate, Sharing, and Motivated  Motor:  Affected by her cancer  Speech/Language:   Normal Rate  Affect:  Depressed and anxious, irritable, some anger  Mood:  angry, anxious, and depressed  Thought process:  goal directed  Thought content:    WNL  Sensory/Perceptual disturbances:    WNL  Orientation:  oriented to person, place, time/date, situation, day of week, month of year, year, and stated date of Aug. 28, 2024  Attention:  Good  Concentration:  Good  Memory:  Reports "not real good as it's affected by her illness "  Fund of knowledge:   Good and Fair  Insight:    Good and Fair   Judgment:   Good  Impulse Control:  Good   Risk Assessment: Danger to Self:  No Self-injurious Behavior: No Danger to Others: No Duty to Warn:no Physical Aggression / Violence:No  Access to Firearms a concern: No  Gang Involvement:No   Subjective:  Patient participating well in session today and sharing symptoms of anxiety, frustration, hopelessness, and anger.  Needed session today to vent and process more of her anger.  Anger seem to be the stronger emotion currently.  Not feeling understood by others, feeling "brushed off by others" and minimized.  She remains in outpatient care of what she describes as hospice and palliative care in her home.  Did very well and processing some of her anger particularly today and was calmer by end of session, and that she said felt some relief. Encouraged patient in her venting some of her pent up anger and frustration and feelings of being misunderstood.  No thoughts of self-harm at this point.  She asked when will be talking again and we have another appointment scheduled within 2 weeks, which she stated would be helpful.  Was emotionally more calm and stable upon our ending the conversation.  Interventions: Cognitive Behavioral Therapy and Ego-Supportive  Long-term goal:  Patient will confront her anxiety, fears, anger reported re: her illness and use time in therapy and beyond to work on this, while receiving feedback and support from therapist. Help her determine what she feels is realistic for her considering her health challenges and difficult prognosis.  Short-term goal: Participate in therapy and initiate communication of needs  and desires at this point in her struggle physically.  Strategies: Share and talk through anxiety, anger, and depression that are experienced and work to find and use helpful coping tools to better manage those feelings.  Diagnosis:   ICD-10-CM   1. Major depressive disorder, recurrent episode, moderate (HCC)  F33.1       Plan:  Patient was very open in session today. She participated quite well in session and has shown some progress in terms of her emotional health and being able to engage with therapist and share/process her feelings of anxiety, and a lot of anger.  Patient does need to continue with goal-directed behaviors in order to get the psych/emotional support and help she needs at this point in her life and as she remains involved and hospice/palliative care.  Goal review and progress/challenges noted with patient.  Next appointment within 3 weeks.   Mathis Fare, LCSW

## 2023-05-30 ENCOUNTER — Ambulatory Visit: Payer: Medicare Other | Admitting: Psychiatry

## 2023-06-07 ENCOUNTER — Encounter: Payer: Self-pay | Admitting: Hematology and Oncology

## 2023-06-10 ENCOUNTER — Other Ambulatory Visit (HOSPITAL_COMMUNITY): Payer: Self-pay

## 2023-06-10 MED ORDER — HEPARIN NA (PORK) LOCK FLSH PF 100 UNIT/ML IV SOLN
INTRAVENOUS | 3 refills | Status: DC
  Filled 2023-06-10: qty 150, 30d supply, fill #0
  Filled 2023-07-11: qty 150, 30d supply, fill #1
  Filled 2023-08-09: qty 150, 30d supply, fill #2
  Filled 2023-09-06: qty 150, 30d supply, fill #3

## 2023-06-10 MED ORDER — NORMAL SALINE FLUSH 0.9 % IV SOLN
INTRAVENOUS | 3 refills | Status: DC
  Filled 2023-06-10: qty 600, 30d supply, fill #0
  Filled 2023-06-21: qty 600, 30d supply, fill #1
  Filled 2023-07-02: qty 600, 30d supply, fill #2
  Filled 2023-07-11: qty 600, 15d supply, fill #3

## 2023-06-14 ENCOUNTER — Ambulatory Visit (INDEPENDENT_AMBULATORY_CARE_PROVIDER_SITE_OTHER): Payer: BC Managed Care – PPO | Admitting: Psychiatry

## 2023-06-14 DIAGNOSIS — F331 Major depressive disorder, recurrent, moderate: Secondary | ICD-10-CM | POA: Diagnosis not present

## 2023-06-14 NOTE — Progress Notes (Signed)
[facility] Counselor/Therapist Progress Note  Patient ID: [name], MRN: [id number],    Date: 06/14/2023  Time Spent: 50 minutes   Treatment Type: Individual Therapy  Virtual Visit via Telehealth Note: MyChart Video session Connected with patient by a telemedicine/telehealth application, with their informed consent, and verified patient privacy and that I am speaking with the correct person using two identifiers. I discussed the limitations, risks, security and privacy concerns of performing psychotherapy and the availability of in person appointments. I also discussed with the patient that there may be a patient responsible charge related to this service. The patient expressed understanding and agreed to proceed. I discussed the treatment planning with the patient. The patient was provided an opportunity to ask questions and all were answered. The patient agreed with the plan and demonstrated an understanding of the instructions. The patient was advised to call  our office if  symptoms worsen or feel they are in a crisis state and need immediate contact.   Therapist Location: office Patient Location: home  Reported Symptoms: depression, anxiety, anger "about other people leading normal lives"  Mental Status Exam:  Appearance:   Casual     Behavior:  Appropriate, Sharing, and Motivated  Motor:  In bed; has cancer  Speech/Language:   Clear and Coherent  Affect:  Depressed and anxious  Mood:  anxious and depressed  Thought process:  goal directed  Thought content:    WNL  Sensory/Perceptual disturbances:    WNL  Orientation:  oriented to person, place, time/date, situation, day of week, month of year, year, and stated date of Sept. 13, 2024  Attention:  Good  Concentration:  Good and Fair  Memory:  Some short term memory issues at times  Progress Energy of knowledge:   Good  Insight:    Good and Fair  Judgment:   Good  Impulse Control:  Good   Risk Assessment: Danger to Self:   No Self-injurious Behavior: No Danger to Others: No Duty to Warn:no Physical Aggression / Violence:No  Access to Firearms a concern: No  Gang Involvement:No   Subjective:   Patient, continues her battle with cancer, and today shares how she has been able to get out of the house and attend movies, etc which was great for her. Today looking more energetic, and talking strongly throughout session which was really good. Still struggles with anxiety, depression, and anger/resentment and she worked further on these feelings today. Cited several examples of her anger/resentment which we processed, but also identified some positives, more positives than usual and we celebrated those today in our processing of what all has been going on with her health issues. Less hopelessness, and anger didn't seem as strong. Not feeling as minimized. Still getting palliative care at home. Encouraged her in some of her progress more recently and trying to "live life more". Denies any thoughts to harm self/others.   Interventions: Cognitive Behavioral Therapy and Ego-Supportive  Long-term goal:  Patient will confront her anxiety, fears, anger reported re: her illness and use time in therapy and beyond to work on this, while receiving feedback and support from therapist. Help her determine what she feels is realistic for her considering her health challenges and difficult prognosis.  Short-term goal: Participate in therapy and initiate communication of needs and desires at this point in her struggle physically.  Strategies: Share and talk through anxiety, anger, and depression that are experienced and work to find and use helpful coping tools to better manage  those feelings.   Diagnosis:   ICD-10-CM   1. Major depressive disorder, recurrent episode, moderate (HCC)  F33.1      Plan:  Patient participating well in session today and was unusually alert and active which is good.  Very verbal during session and able to  stay focused.  She has made progress and needs to continue her work with goal-directed behaviors to be supported and also move in a positive emotional direction as she remains involved in palliative care.  Goal review and progress/challenges noted with patient.  Next appointment within 3 weeks.   Mathis Fare, LCSW

## 2023-06-20 ENCOUNTER — Ambulatory Visit
Admission: RE | Admit: 2023-06-20 | Discharge: 2023-06-20 | Disposition: A | Discharge status: Home / Self Care | Payer: BC Managed Care – PPO | Source: Ambulatory Visit | Attending: Nurse Practitioner | Admitting: Nurse Practitioner

## 2023-06-20 ENCOUNTER — Other Ambulatory Visit: Payer: Self-pay

## 2023-06-20 VITALS — BP 107/68 | HR 118 | Temp 99.7°F | Resp 18

## 2023-06-20 DIAGNOSIS — M5442 Lumbago with sciatica, left side: Secondary | ICD-10-CM

## 2023-06-20 DIAGNOSIS — S39012A Strain of muscle, fascia and tendon of lower back, initial encounter: Secondary | ICD-10-CM

## 2023-06-20 DIAGNOSIS — Z8739 Personal history of other diseases of the musculoskeletal system and connective tissue: Secondary | ICD-10-CM

## 2023-06-20 MED ORDER — CYCLOBENZAPRINE HCL 5 MG PO TABS: 5.0000 mg | ORAL_TABLET | Freq: Three times a day (TID) | ORAL | 0 refills | Status: DC | PRN

## 2023-06-20 MED ORDER — PREDNISONE 20 MG PO TABS: 40.0000 mg | ORAL_TABLET | Freq: Every day | ORAL | 0 refills | Status: AC

## 2023-06-20 NOTE — ED Provider Notes (Signed)
[facility]    CSN: [id number] Arrival date & time: 06/20/23  1648      History   Chief Complaint Chief Complaint  Patient presents with   Back Pain    Entered by patient    HPI [name] is a 52 y.o. female.   The history is provided by the patient and the spouse.   Patient presents for complaints of low back pain that started after she was picking up a box.  Patient states that the box weighed approximately 2 pounds, and when she went to turn, she felt something in her lower back.  Since that time, she states the pain feels like a "lightening bolt".  She states that the pain radiates down the left leg.  She denies fever, chills, chest pain, abdominal pain, urinary symptoms, or loss of bowel or bladder function.  Patient with a significant past medical history that includes Addison's disease, adrenal insufficiency, cervical cancer, CHF, Hodgkin's lymphoma, pulmonary hypertension, hypertension, and thyroid cancer.  Patient reports that she currently takes hydrocortisone daily, but is able to take prednisone when needed.  Past Medical History:  Diagnosis Date   Addison's disease (HCC)    Adrenal insufficiency (HCC)    Anemia    Anxiety    Aortic stenosis    Aortic stenosis    Appendicitis 12/19/09   Appendicitis    Breast cancer (HCC)    STATUS POST BILATERAL MASTECTOMY. STATUS POST RECONSTRUCTION. SHE HAD SILICONE BREAST IMPLANTS AND THE LEFT IMPLANT IS LEAKING SLIGHTLY   Cellulitis of right middle finger 11/07/2018   Cervical cancer (HCC) 12/23/2018   Chest pain    CHF with right heart failure (HCC) 04/17/2017   Chronic respiratory failure with hypoxia (HCC) 12/23/2018   Cough variant asthma 04/13/2019   Depression    GERD (gastroesophageal reflux disease)    takes Dexilant and carafate and gi coctail    Headache    migraines on a daily and monthly regimen    Heart murmur    History of kidney stones    Hodgkin lymphoma (HCC)    STATUS POST MANTLE RADIATION    Hodgkin's lymphoma (HCC)    1987   Hypertension    Hypoxia    Necrotizing fasciitis (HCC) 12/23/2018   Non-ischemic cardiomyopathy (HCC)    Osteoporosis    Palpitations    Pituitary adenoma (HCC) 12/23/2018   Pneumonia    PONV (postoperative nausea and vomiting)    Pre-diabetes    per pt; no meds   Pulmonary hypertension (HCC) 12/23/2018   Raynaud phenomenon    Right heart failure (HCC) 04/17/2017   Seizures (HCC)    last febrile seizure was approx 3 weeks ago per report on 12/01/2020   Sleep apnea    upcoming sleep study per pt    Supplemental oxygen dependent    3 liters   SVT (supraventricular tachycardia)    Tachycardia    Thyroid cancer (HCC)    STATUS POST SURGICAL REMOVAL-CURRENT ON THYROID REPLACEMENT    Patient Active Problem List   Diagnosis Date Noted   Leukocytosis 04/23/2023   AKI (acute kidney injury) (HCC) 04/23/2023   Sepsis (HCC) 04/23/2023   Adrenal crisis (HCC) 04/22/2023   Lobar pneumonia (HCC) 03/10/2023   Severe persistent asthma 03/09/2023   CKD stage 3a, GFR 45-59 ml/min (HCC) 03/09/2023   Goals of care, counseling/discussion 01/22/2023   Anemia 01/22/2023   Cellulitis 09/13/2022   Grade I diastolic dysfunction 09/13/2022   Excessive daytime sleepiness  08/01/2022   Community acquired pneumonia    Severe persistent asthma with acute exacerbation    Anxiety    Anxiety and depression 04/06/2022   GERD without esophagitis 04/06/2022   Sepsis due to pneumonia (HCC) 04/06/2022   PICC (peripherally inserted central catheter) in place    Bacteremia due to Enterobacter species 03/29/2022   Normocytic anemia 03/29/2022   Major depressive disorder, recurrent episode, moderate (HCC)    Generalized anxiety disorder/depression    Seizure-like activity (HCC) 12/08/2020   High anion gap metabolic acidosis 12/08/2020   Hypokalemia 12/08/2020   Acute renal failure superimposed on stage 3a chronic kidney disease (HCC) 04/27/2020   Vasculopathy 04/27/2020    Bacteremia 04/19/2020   Addison's disease (HCC)    Chronic nausea 04/01/2020   Generalized weakness 02/10/2020   Headache 02/10/2020   Subcutaneous nodule 02/01/2020   Port-A-Cath in place 09/09/2019   Moderate persistent asthma without complication 08/09/2019   Fever 07/21/2019   Superficial thrombophlebitis 07/16/2019   Injury of left index finger 12/24/2018   History of pituitary adenoma 12/23/2018   History of cervical cancer 12/23/2018   Chronic respiratory failure with hypoxia (HCC) 12/23/2018   SOB (shortness of breath)    Nephrolithiasis 11/07/2018   Oxygen dependent 11/07/2018   Migraine 11/07/2018   PVC's (premature ventricular contractions) 10/27/2018   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   Right heart failure (HCC) 04/17/2017   Hyperlipidemia 03/09/2014   Hx of Hodgkins lymphoma    History of ductal carcinoma in situ (DCIS) of breast    History of thyroid cancer s/p thyroidectomy and post surgical hypothyroidism    Raynaud phenomenon    GERD (gastroesophageal reflux disease)     Past Surgical History:  Procedure Laterality Date   ABDOMINAL HYSTERECTOMY     AMPUTATION Left 01/30/2019   Procedure: Left Index finger amputation with flap reconstruction and repair reconstruction;  Surgeon: Dominica Severin, MD;  Location: [facility] OR;  Service: Orthopedics;  Laterality: Left;   APPENDECTOMY     breast implants and removal      breast implants but leaking      CARDIAC CATHETERIZATION  05/18/09   NORMAL CATH   COLONOSCOPY     hx of chemotherapy      hx of radiation therapy      I & D EXTREMITY Left 12/23/2018   Procedure: IRRIGATION AND DEBRIDEMENT HAND / INDEX FINGER;  Surgeon: Dominica Severin, MD;  Location: [facility] OR;  Service: Orthopedics;  Laterality: Left;   IR CV LINE INJECTION  03/22/2022   IR FLUORO GUIDE CV LINE RIGHT  10/09/2022   IR FLUORO GUIDE CV LINE RIGHT  01/18/2023   IR FLUORO GUIDE CV LINE RIGHT  03/26/2023   IR IMAGING GUIDED PORT INSERTION  05/06/2020   IR  IMAGING GUIDED PORT INSERTION  12/04/2021   IR REMOVAL TUN ACCESS W/ PORT W/O FL MOD SED  04/27/2020   IR REMOVAL TUN ACCESS W/ PORT W/O FL MOD SED  12/04/2021   IR REMOVAL TUN ACCESS W/ PORT W/O FL MOD SED  03/30/2022   IR US GUIDE VASC ACCESS RIGHT  10/09/2022   KIDNEY STONE SURGERY     LUMBAR PUNCTURE W/ INTRATHECAL CHEMOTHERAPY     MASTECTOMY     PITUITARY SURGERY     RIGHT/LEFT HEART CATH AND CORONARY ANGIOGRAPHY N/A 04/02/2018   Procedure: RIGHT/LEFT HEART CATH AND CORONARY ANGIOGRAPHY;  Surgeon: Kathleene Hazel, MD;  Location: [facility] INVAS[facility];  Service: Cardiovascular;  Laterality: N/A;   RIGHT/LEFT HEART  CATH AND CORONARY ANGIOGRAPHY N/A 08/31/2022   Procedure: RIGHT/LEFT HEART CATH AND CORONARY ANGIOGRAPHY;  Surgeon: Dolores Patty, MD;  Location: [facility];  Service: Cardiovascular;  Laterality: N/A;   TOOTH EXTRACTION N/A 12/05/2020   Procedure: DENTAL RESTORATION/EXTRACTIONS;  Surgeon: Lovena Neighbours, MD;  Location: [facility]S;  Service: Oral Surgery;  Laterality: N/A;   TOTAL THYROIDECTOMY     VIDEO BRONCHOSCOPY Bilateral 11/14/2018   Procedure: VIDEO BRONCHOSCOPY WITHOUT FLUORO;  Surgeon: Luciano Cutter, MD;  Location: [facility] ENDOSCOPY;  Service: Cardiopulmonary;  Laterality: Bilateral;   VIDEO BRONCHOSCOPY WITH ENDOBRONCHIAL ULTRASOUND N/A 11/19/2018   Procedure: VIDEO BRONCHOSCOPY WITH ENDOBRONCHIAL ULTRASOUND;  Surgeon: Luciano Cutter, MD;  Location: [facility] OR;  Service: Thoracic;  Laterality: N/A;    OB History   No obstetric history on file.      Home Medications    Prior to Admission medications   Medication Sig Start Date End Date Taking? Authorizing Provider  cyclobenzaprine (FLEXERIL) 5 MG tablet Take 1 tablet (5 mg total) by mouth 3 (three) times daily as needed for muscle spasms. 06/20/23  Yes Lance Galas-Warren, Sadie Haber, NP  predniSONE (DELTASONE) 20 MG tablet Take 2 tablets (40 mg total) by mouth daily with breakfast for 5 days. 06/20/23 06/25/23 Yes  Claris Pech-Warren, Sadie Haber, NP  albuterol (PROVENTIL) (2.5 MG/3ML) 0.083% nebulizer solution Take 3 mLs (2.5 mg total) by nebulization every 6 (six) hours as needed for wheezing or shortness of breath. 05/16/23 08/14/23  Luciano Cutter, MD  ALPRAZolam Prudy Feeler) 1 MG tablet Take 1 tablet (1 mg total) by mouth daily. 05/06/23   Mozingo, Thereasa Solo, NP  aspirin EC 81 MG tablet Take 1 tablet (81 mg total) by mouth daily. Swallow whole. Patient taking differently: Take 81 mg by mouth at bedtime. Chewable 07/16/22   Nahser, Deloris Ping, MD  Bismuth Tribromoph-Petrolatum (XEROFORM OCCLUSIVE GAUZE STRIP) PADS Apply 1 each topically as directed. Patient not taking: Reported on 05/16/2023 09/17/22   Rolly Salter, MD  bumetanide (BUMEX) 1 MG tablet TAKE 1 TABLET BY MOUTH TWICE A DAY Patient taking differently: Take 1 mg by mouth 2 (two) times daily. 11/20/22   Nahser, Deloris Ping, MD  buPROPion (WELLBUTRIN XL) 150 MG 24 hr tablet TAKE 1 TABLET BY MOUTH EVERY DAY 05/06/23   Mozingo, Thereasa Solo, NP  clonazePAM (KLONOPIN) 1 MG tablet Take 1 tablet (1 mg total) by mouth 2 (two) times daily. Patient taking differently: Take 0.25 mg by mouth in the morning, at noon, in the evening, and at bedtime. 05/06/23   Mozingo, Thereasa Solo, NP  diphenhydrAMINE (BENADRYL) 12.5 MG/5ML elixir Take 10 mLs (25 mg total) by mouth every 6 (six) hours as needed (nausea). 09/17/22   Rolly Salter, MD  EMGALITY 120 MG/ML SOSY Inject 120 mg into the skin every 28 (twenty-eight) days. Patient not taking: Reported on 05/16/2023    [provider]  EPINEPHRINE 0.3 mg/0.3 mL IJ SOAJ injection INJECT 0.3 MG INTO THE MUSCLE AS NEEDED FOR ANAPHYLAXIS. 04/25/23   Luciano Cutter, MD  escitalopram (LEXAPRO) 20 MG tablet TAKE 1 TABLET BY MOUTH EVERY DAY IN THE EVENING Patient taking differently: Take 20 mg by mouth daily in the afternoon. Afternoon 03/08/23   Mozingo, Thereasa Solo, NP  famotidine (PEPCID) 40 MG/5ML suspension Take  2.5 mLs (20 mg total) by mouth daily. 03/30/23 05/16/23  Curatolo, Adam, DO  FLORASTOR 250 MG capsule Take 250 mg by mouth 2 (two) times daily. Mid Morning and Mid Afternoon  [provider]  fluticasone-salmeterol (ADVAIR HFA) 230-21 MCG/ACT inhaler INHALE 2 PUFFS INTO THE LUNGS TWICE A DAY Patient not taking: Reported on 04/22/2023 04/16/23   Luciano Cutter, MD  Heparin Na, Pork, Lock Flsh PF (BD HEPARIN POSIFLUSH) 100 UNIT/ML SOLN USE 5 MLS IN PORT A CATH ONCE DAILY AFTER MEDICATION ADMINISTRATION AS A HEPLOCK AS DIRECTED. 06/10/23     hydrocortisone (CORTEF) 10 MG tablet Take 1-2 tablets (10-20 mg total) by mouth See admin instructions. Take 20 mg in the am and 10mg  in the evening. Take after completing prednisone Patient taking differently: Take 10-20 mg by mouth See admin instructions. Take 20 mg in the am at 10 am and 10 mg in the afternoon. Take after completing prednisone 09/29/22   Rolly Salter, MD  hydrOXYzine (ATARAX) 25 MG tablet Take 1 tablet (25 mg total) by mouth every 6 (six) hours as needed for anxiety. Patient not taking: Reported on 05/16/2023 01/24/23   Azucena Fallen, MD  lamoTRIgine (LAMICTAL) 200 MG tablet TAKE 1 TABLET BY MOUTH EVERYDAY AT BEDTIME 05/06/23   Mozingo, Thereasa Solo, NP  lamoTRIgine (LAMICTAL) 25 MG tablet Take two tablets every morning. 05/28/23   Mozingo, Thereasa Solo, NP  Lancets (ONETOUCH DELICA PLUS [place]) MISC 3 (three) times daily. for testing 06/12/21   [provider]  lansoprazole (PREVACID) 15 MG capsule Take 30 mg by mouth daily at 12 noon.    [provider]  LORazepam (ATIVAN) 0.5 MG tablet Take 1 tablet (0.5 mg total) by mouth 3 (three) times daily as needed for anxiety. Patient taking differently: Place 0.5 mg into feeding tube 3 (three) times daily as needed for anxiety. [employer] For Nausea 02/11/23   Mozingo, Thereasa Solo, NP  nitroGLYCERIN (NITROSTAT) 0.4 MG SL tablet DISSOLVE 1 TAB UNDER THE TONGUE  EVERY 5 MINUTES AS NEEDED FOR CHEST PAIN. MAX OF 3 DOSES, THEN 911. 05/13/23   Nahser, Deloris Ping, MD  [facility] VERIO test strip 3 (three) times daily. for testing 06/12/21   [provider]  Oxycodone HCl 10 MG TABS Take 1 tablet by mouth every 6 (six) hours as needed (pain).    [provider]  OXYGEN Inhale 4-5 L/min into the lungs continuous.    [provider]  PROAIR HFA 108 [phone] Base) MCG/ACT inhaler Inhale 2 puffs into the lungs every 4 (four) hours as needed for wheezing or shortness of breath.    [provider]  Rimegepant Sulfate (NURTEC) 75 MG TBDP Take 75 mg by mouth daily as needed (Migraine).  Patient not taking: Reported on 05/16/2023    [provider]  rosuvastatin (CRESTOR) 10 MG tablet Take 10 mg by mouth in the morning. Mid morning 02/28/18   [provider]  silver sulfADIAZINE (SILVADENE) 1 % cream Apply to affected area daily Patient taking differently: Apply 1 Application topically daily as needed. 09/17/22 09/17/23  Rolly Salter, MD  Sodium Chloride Flush (NORMAL SALINE FLUSH) 0.9 % SOLN Use as directed for central venous catheter maintenance 06/10/23     Sodium Chloride Flush (NORMAL SALINE FLUSH) 0.9 % SOLN Use as directed for central venous catheter maintenance 01/31/23     sucralfate (CARAFATE) 1 GM/10ML suspension Take 1 g by mouth daily as needed (as directed for ulcers).    [provider]  SYNTHROID 100 MCG tablet Take 1 tablet (100 mcg total) by mouth every morning. Patient taking differently: Take 100 mcg by mouth every morning. * Must be name brand*  10/19/21   Serena Croissant, MD  Tiotropium Bromide Monohydrate (SPIRIVA RESPIMAT) 1.25 MCG/ACT AERS Inhale 2 puffs into the lungs daily. 05/16/23   Luciano Cutter, MD  topiramate (TOPAMAX) 50 MG tablet Take 150 mg by mouth at bedtime.  12/25/19   [provider]  zolpidem (AMBIEN CR) 12.5 MG CR tablet TAKE ONE TABLET BY MOUTH AT BEDTIME AS NEEDED FOR  SLEEP. 05/14/23       Family History Family History  Family history unknown: Yes    Social History Social History   Tobacco Use   Smoking status: Never   Smokeless tobacco: Never  Vaping Use   Vaping status: Never Used  Substance Use Topics   Alcohol use: Not Currently    Comment: social    Drug use: No     Allergies   Ferrous bisglycinate chelate [iron], Mushroom extract complex, Na ferric gluc cplx in sucrose, Cymbalta [duloxetine hcl], Hydromorphone, Ondansetron hcl, Promethazine, Succinylcholine, Buprenorphine hcl, Compazine, Duloxetine, Metoclopramide, Morphine and codeine, Ondansetron, Promethazine hcl, and Tegaderm ag mesh [silver]   Review of Systems Review of Systems Per HPI  Physical Exam Triage Vital Signs ED Triage Vitals  Encounter Vitals Group     BP 06/20/23 1701 107/68     Systolic BP Percentile --      Diastolic BP Percentile --      Pulse Rate 06/20/23 1701 (!) 118     Resp 06/20/23 1701 18     Temp 06/20/23 1701 99.7 F (37.6 C)     Temp Source 06/20/23 1701 Oral     SpO2 06/20/23 1701 99 %     Weight --      Height --      Head Circumference --      Peak Flow --      Pain Score 06/20/23 1702 7     Pain Loc --      Pain Education --      Exclude from Growth Chart --    No data found.  Updated Vital Signs BP 107/68 (BP Location: Right Arm)   Pulse (!) 118   Temp 99.7 F (37.6 C) (Oral)   Resp 18   LMP  (LMP Unknown)   SpO2 99%   Visual Acuity Right Eye Distance:   Left Eye Distance:   Bilateral Distance:    Right Eye Near:   Left Eye Near:    Bilateral Near:     Physical Exam Vitals and nursing note reviewed.  Constitutional:      Appearance: Normal appearance.  HENT:     Head: Normocephalic.  Eyes:     Extraocular Movements: Extraocular movements intact.     Pupils: Pupils are equal, round, and reactive to light.  Cardiovascular:     Rate and Rhythm: Regular rhythm.     Pulses: Normal pulses.     Heart sounds:  Normal heart sounds.  Pulmonary:     Effort: Pulmonary effort is normal. No respiratory distress.     Breath sounds: Normal breath sounds. No stridor. No wheezing, rhonchi or rales.  Abdominal:     General: Bowel sounds are normal.     Palpations: Abdomen is soft.     Tenderness: There is no abdominal tenderness.  Musculoskeletal:     Cervical back: Normal range of motion.     Lumbar back: Spasms and tenderness present. No swelling or deformity. Decreased range of motion. Positive right straight leg raise test.     Comments: Tenderness noted from L1-L5.  Lymphadenopathy:     Cervical: No cervical adenopathy.  Skin:    General: Skin is warm and dry.  Neurological:     General: No focal deficit present.     Mental Status: She is alert and oriented to person, place, and time.  Psychiatric:        Mood and Affect: Mood normal.        Behavior: Behavior normal.      UC Treatments / Results  Labs (all labs ordered are listed, but only abnormal results are displayed) Labs Reviewed - No data to display  EKG   Radiology No results found.  Procedures Procedures (including critical care time)  Medications Ordered in UC Medications - No data to display  Initial Impression / Assessment and Plan / UC Course  I have reviewed the triage vital signs and the nursing notes.  Pertinent labs & imaging results that were available during my care of the patient were reviewed by me and considered in my medical decision making (see chart for details).  Patient with significant past medical history presents for complaints of low back pain after picking up a box and turning.  Symptoms appear to be consistent with a lumbar strain.  Will treat with prednisone 40 mg for the next 5 days to help with inflammation and cyclobenzaprine 5 mg for spasm and stiffness.  Supportive care recommendations were provided and discussed with the patient and her spouse to include Tylenol for pain or discomfort, the  use of ice or heat, and staying as active as possible.  Patient was advised to follow-up with orthopedics if symptoms fail to improve with this treatment.  Patient was also given strict ER follow-up precautions.  Patient is in agreement with this plan of care and verbalizes understanding.  All questions were answered.  Patient stable for discharge.  Final Clinical Impressions(s) / UC Diagnoses   Final diagnoses:  Lumbar strain, initial encounter  Low back pain with left-sided sciatica, unspecified back pain laterality, unspecified chronicity  History of osteoporosis     Discharge Instructions      Take medication as prescribed. Try to remain as active as possible. May take Tylenol Arthritis Strength 650 mg tablets as needed for back pain or discomfort. Gentle range of motion and stretching exercises to help with back spasm and pain. May apply ice or heat as needed.  Ice is recommended for pain or swelling, heat for spasm or stiffness.  Apply for 20 minutes, remove for 1 hour, then repeat. May take over-the-counter Tylenol extra strength 500 mg tablet approximately 1 -2 hours after taking ibuprofen for breakthrough pain. Go to the emergency department immediately if you develop weakness in your legs or feet, inability to walk, loss of bowel or bladder function, difficulty urinating or passing a bowel movement, or other concerns. As discussed, if symptoms fail to improve with this treatment, please follow-up with orthopedics or with your primary care physician for further evaluation. Follow-up as needed.     ED Prescriptions     Medication Sig Dispense Auth. Provider   predniSONE (DELTASONE) 20 MG tablet Take 2 tablets (40 mg total) by mouth daily with breakfast for 5 days. 10 tablet Kyndal Gloster-Warren, Sadie Haber, NP   cyclobenzaprine (FLEXERIL) 5 MG tablet Take 1 tablet (5 mg total) by mouth 3 (three) times daily as needed for muscle spasms. 20 tablet Dorr Perrot-Warren, Sadie Haber, NP       PDMP not reviewed this encounter.   Abran Cantor, NP 06/20/23 1753

## 2023-06-20 NOTE — ED Triage Notes (Signed)
Pt reports she has had sudden back pain since earlier today.   Denies injury

## 2023-06-20 NOTE — Discharge Instructions (Addendum)
Take medication as prescribed. Try to remain as active as possible. May take Tylenol Arthritis Strength 650 mg tablets as needed for back pain or discomfort. Gentle range of motion and stretching exercises to help with back spasm and pain. May apply ice or heat as needed.  Ice is recommended for pain or swelling, heat for spasm or stiffness.  Apply for 20 minutes, remove for 1 hour, then repeat. May take over-the-counter Tylenol extra strength 500 mg tablet approximately 1 -2 hours after taking ibuprofen for breakthrough pain. Go to the emergency department immediately if you develop weakness in your legs or feet, inability to walk, loss of bowel or bladder function, difficulty urinating or passing a bowel movement, or other concerns. As discussed, if symptoms fail to improve with this treatment, please follow-up with orthopedics or with your primary care physician for further evaluation. Follow-up as needed.

## 2023-06-21 ENCOUNTER — Encounter: Payer: Self-pay | Admitting: Hematology and Oncology

## 2023-06-21 ENCOUNTER — Other Ambulatory Visit (HOSPITAL_COMMUNITY): Payer: Self-pay

## 2023-06-26 ENCOUNTER — Telehealth (HOSPITAL_COMMUNITY): Payer: Self-pay

## 2023-06-26 NOTE — Telephone Encounter (Signed)
Called to schedule catheter removal. Pt would like to wait until Monday after she speaks with her doctors about a plan. She will call back to schedule. AB

## 2023-06-28 ENCOUNTER — Ambulatory Visit: Payer: Medicare Other | Admitting: Psychiatry

## 2023-07-01 ENCOUNTER — Ambulatory Visit: Payer: BC Managed Care – PPO | Admitting: Psychiatry

## 2023-07-01 DIAGNOSIS — F331 Major depressive disorder, recurrent, moderate: Secondary | ICD-10-CM | POA: Diagnosis not present

## 2023-07-01 NOTE — Progress Notes (Signed)
[facility] Counselor/Therapist Progress Note  Patient ID: [name], MRN: [id number],    Date: 07/01/2023  Time Spent: 50 minutes   Treatment Type: Individual Therapy  Virtual Visit via Telehealth Note: MyChart Video session Connected with patient by a telemedicine/telehealth application, with their informed consent, and verified patient privacy and that I am speaking with the correct person using two identifiers. I discussed the limitations, risks, security and privacy concerns of performing psychotherapy and the availability of in person appointments. I also discussed with the patient that there may be a patient responsible charge related to this service. The patient expressed understanding and agreed to proceed. I discussed the treatment planning with the patient. The patient was provided an opportunity to ask questions and all were answered. The patient agreed with the plan and demonstrated an understanding of the instructions. The patient was advised to call  our office if  symptoms worsen or feel they are in a crisis state and need immediate contact.   Therapist Location: office Patient Location: home  Reported Symptoms:  tiredness, depression, no SI  Mental Status Exam:  Appearance:   Casual     Behavior:  Appropriate, Sharing, and some motivation to talk in session today "but not much other motivation"  Motor:  Affected by her illness  Speech/Language:   Clear and Coherent  Affect:  Depressed  Mood:  depressed  Thought process:  goal directed  Thought content:    WNL  Sensory/Perceptual disturbances:    WNL  Orientation:  oriented to person, place, time/date, situation, day of week, month of year, year, and stated date of Sept. 30, 2024  Attention:  Fair  Concentration:  Fair  Memory:  Reports some short term memory issues  Fund of knowledge:   Fair  Insight:    Fair  Judgment:   Fair  Impulse Control:  Good   Risk Assessment: Danger to Self:   No Self-injurious Behavior: No Danger to Others: No Duty to Warn:no Physical Aggression / Violence:No  Access to Firearms a concern: No  Gang Involvement:No   Subjective:   Patient alert, showing motivation, and good participation in session as she focused more on her ongoing challenges with cancer.  Has continued to be able to get out of the house some and participate in social activities which is helpful for her. Depressed and reports having prior SI at times (most recent about 2 wks ago, but did not harm self) and commits to not harming herself at this point, and agrees to contact our office or [facility] is SI returns. Some resentment within family. Patient reports lack of family closeness in some ways, and also impacts her trust, and feeling confused about their genuineness which she processed at length today. States she is still connected (she thinks) with palliative care but with mixed concerns about access. (Not all details included in this note due to patient privacy needs.)   Interventions: Cognitive Behavioral Therapy and Ego-Supportive  Long-term goal:  Patient will confront her anxiety, fears, anger reported re: her illness and use time in therapy and beyond to work on this, while receiving feedback and support from therapist. Help her determine what she feels is realistic for her considering her health challenges and difficult prognosis.  Short-term goal: Participate in therapy and initiate communication of needs and desires at this point in her struggle physically.  Strategies: Share and talk through anxiety, anger, and depression that are experienced and work to find and use helpful  coping tools to better manage those feelings.  Diagnosis:   ICD-10-CM   1. Major depressive disorder, recurrent episode, moderate (HCC)  F33.1      Plan:  Patient motivated and actively participating in session today but reports feeling more down and that people do not care.  Spent a lot of session  focusing on this particular issue and she did seem to feel connected and cared about later in session.  Has made some progress overall, and needs to continue working with goal-directed behaviors and strengthen her emotional health as much as possible. Goal review and progress/challenges noted with patient.  Next appointment within 3 weeks.   Mathis Fare, LCSW

## 2023-07-02 ENCOUNTER — Encounter: Payer: Self-pay | Admitting: Hematology and Oncology

## 2023-07-02 ENCOUNTER — Other Ambulatory Visit: Payer: Self-pay | Admitting: Cardiovascular Disease

## 2023-07-02 ENCOUNTER — Other Ambulatory Visit (HOSPITAL_COMMUNITY): Payer: Self-pay

## 2023-07-03 ENCOUNTER — Ambulatory Visit (HOSPITAL_COMMUNITY): Payer: BC Managed Care – PPO

## 2023-07-03 ENCOUNTER — Other Ambulatory Visit: Payer: Self-pay | Admitting: Cardiovascular Disease

## 2023-07-09 ENCOUNTER — Encounter (HOSPITAL_COMMUNITY): Payer: Self-pay

## 2023-07-09 ENCOUNTER — Other Ambulatory Visit: Payer: Self-pay

## 2023-07-09 ENCOUNTER — Emergency Department (HOSPITAL_COMMUNITY)
Admission: EM | Admit: 2023-07-09 | Discharge: 2023-07-09 | Disposition: A | Discharge status: Home / Self Care | Payer: BC Managed Care – PPO | Attending: Emergency Medicine | Admitting: Emergency Medicine

## 2023-07-09 ENCOUNTER — Emergency Department (HOSPITAL_COMMUNITY): Payer: BC Managed Care – PPO

## 2023-07-09 DIAGNOSIS — E119 Type 2 diabetes mellitus without complications: Secondary | ICD-10-CM | POA: Diagnosis not present

## 2023-07-09 DIAGNOSIS — Z7951 Long term (current) use of inhaled steroids: Secondary | ICD-10-CM | POA: Diagnosis not present

## 2023-07-09 DIAGNOSIS — Z7984 Long term (current) use of oral hypoglycemic drugs: Secondary | ICD-10-CM | POA: Insufficient documentation

## 2023-07-09 DIAGNOSIS — J45909 Unspecified asthma, uncomplicated: Secondary | ICD-10-CM | POA: Diagnosis not present

## 2023-07-09 DIAGNOSIS — I129 Hypertensive chronic kidney disease with stage 1 through stage 4 chronic kidney disease, or unspecified chronic kidney disease: Secondary | ICD-10-CM | POA: Diagnosis not present

## 2023-07-09 DIAGNOSIS — I700 Atherosclerosis of aorta: Secondary | ICD-10-CM | POA: Diagnosis not present

## 2023-07-09 DIAGNOSIS — J9811 Atelectasis: Secondary | ICD-10-CM | POA: Diagnosis not present

## 2023-07-09 DIAGNOSIS — R000 Tachycardia, unspecified: Secondary | ICD-10-CM | POA: Diagnosis not present

## 2023-07-09 DIAGNOSIS — R0602 Shortness of breath: Secondary | ICD-10-CM | POA: Diagnosis not present

## 2023-07-09 DIAGNOSIS — U071 COVID-19: Secondary | ICD-10-CM | POA: Diagnosis not present

## 2023-07-09 DIAGNOSIS — R052 Subacute cough: Secondary | ICD-10-CM

## 2023-07-09 DIAGNOSIS — Z7982 Long term (current) use of aspirin: Secondary | ICD-10-CM | POA: Diagnosis not present

## 2023-07-09 DIAGNOSIS — R0902 Hypoxemia: Secondary | ICD-10-CM | POA: Diagnosis not present

## 2023-07-09 DIAGNOSIS — Z853 Personal history of malignant neoplasm of breast: Secondary | ICD-10-CM | POA: Diagnosis not present

## 2023-07-09 DIAGNOSIS — R059 Cough, unspecified: Secondary | ICD-10-CM | POA: Diagnosis not present

## 2023-07-09 DIAGNOSIS — Z79899 Other long term (current) drug therapy: Secondary | ICD-10-CM | POA: Diagnosis not present

## 2023-07-09 DIAGNOSIS — N183 Chronic kidney disease, stage 3 unspecified: Secondary | ICD-10-CM | POA: Insufficient documentation

## 2023-07-09 DIAGNOSIS — R0989 Other specified symptoms and signs involving the circulatory and respiratory systems: Secondary | ICD-10-CM | POA: Diagnosis not present

## 2023-07-09 LAB — CBC WITH DIFFERENTIAL/PLATELET
Abs Immature Granulocytes: 0.03 10*3/uL (ref 0.00–0.07)
Basophils Absolute: 0.1 10*3/uL (ref 0.0–0.1)
Basophils Relative: 1 %
Eosinophils Absolute: 0.1 10*3/uL (ref 0.0–0.5)
Eosinophils Relative: 1 %
HCT: 33.4 % — ABNORMAL LOW (ref 36.0–46.0)
Hemoglobin: 10 g/dL — ABNORMAL LOW (ref 12.0–15.0)
Immature Granulocytes: 0 %
Lymphocytes Relative: 15 %
Lymphs Abs: 1.5 10*3/uL (ref 0.7–4.0)
MCH: 24.3 pg — ABNORMAL LOW (ref 26.0–34.0)
MCHC: 29.9 g/dL — ABNORMAL LOW (ref 30.0–36.0)
MCV: 81.3 fL (ref 80.0–100.0)
Monocytes Absolute: 0.8 10*3/uL (ref 0.1–1.0)
Monocytes Relative: 8 %
Neutro Abs: 7.5 10*3/uL (ref 1.7–7.7)
Neutrophils Relative %: 75 %
Platelets: 369 10*3/uL (ref 150–400)
RBC: 4.11 MIL/uL (ref 3.87–5.11)
RDW: 18 % — ABNORMAL HIGH (ref 11.5–15.5)
WBC: 10.1 10*3/uL (ref 4.0–10.5)
nRBC: 0 % (ref 0.0–0.2)

## 2023-07-09 LAB — BASIC METABOLIC PANEL
Anion gap: 11 (ref 5–15)
BUN: 24 mg/dL — ABNORMAL HIGH (ref 6–20)
CO2: 25 mmol/L (ref 22–32)
Calcium: 8.2 mg/dL — ABNORMAL LOW (ref 8.9–10.3)
Chloride: 104 mmol/L (ref 98–111)
Creatinine, Ser: 1.43 mg/dL — ABNORMAL HIGH (ref 0.44–1.00)
GFR, Estimated: 44 mL/min — ABNORMAL LOW (ref >60)
Glucose, Bld: 110 mg/dL — ABNORMAL HIGH (ref 70–99)
Potassium: 3.1 mmol/L — ABNORMAL LOW (ref 3.5–5.1)
Sodium: 140 mmol/L (ref 135–145)

## 2023-07-09 LAB — BLOOD GAS, VENOUS
Acid-Base Excess: 0.8 mmol/L (ref 0.0–2.0)
Bicarbonate: 26 mmol/L (ref 20.0–28.0)
O2 Saturation: 69.1 %
Patient temperature: 37
pCO2, Ven: 43 mm[Hg] — ABNORMAL LOW (ref 44–60)
pH, Ven: 7.39 (ref 7.25–7.43)
pO2, Ven: 39 mm[Hg] (ref 32–45)

## 2023-07-09 LAB — SARS CORONAVIRUS 2 BY RT PCR: SARS Coronavirus 2 by RT PCR: POSITIVE — AB

## 2023-07-09 LAB — BRAIN NATRIURETIC PEPTIDE: B Natriuretic Peptide: 38.5 pg/mL (ref 0.0–100.0)

## 2023-07-09 LAB — TROPONIN I (HIGH SENSITIVITY): Troponin I (High Sensitivity): 4 ng/L (ref <18)

## 2023-07-09 MED ORDER — PREDNISONE 10 MG PO TABS: 60.0000 mg | ORAL_TABLET | Freq: Every day | ORAL | 0 refills | Status: AC

## 2023-07-09 MED ORDER — METHYLPREDNISOLONE SODIUM SUCC 125 MG IJ SOLR
125.0000 mg | Freq: Once | INTRAMUSCULAR | Status: AC
  Administered 2023-07-09: 125 mg via INTRAVENOUS
  Filled 2023-07-09: qty 2

## 2023-07-09 MED ORDER — GUAIFENESIN 100 MG/5ML PO LIQD
5.0000 mL | Freq: Once | ORAL | Status: AC
  Administered 2023-07-09: 5 mL via ORAL
  Filled 2023-07-09: qty 10

## 2023-07-09 MED ORDER — MAGNESIUM SULFATE 2 GM/50ML IV SOLN
2.0000 g | Freq: Once | INTRAVENOUS | Status: DC
  Filled 2023-07-09: qty 50

## 2023-07-09 MED ORDER — ACETAMINOPHEN 325 MG PO TABS
650.0000 mg | ORAL_TABLET | Freq: Once | ORAL | Status: AC
  Administered 2023-07-09: 650 mg via ORAL
  Filled 2023-07-09: qty 2

## 2023-07-09 MED ORDER — BENZONATATE 100 MG PO CAPS
100.0000 mg | ORAL_CAPSULE | Freq: Once | ORAL | Status: AC
  Administered 2023-07-09: 100 mg via ORAL
  Filled 2023-07-09: qty 1

## 2023-07-09 MED ORDER — NIRMATRELVIR/RITONAVIR (PAXLOVID) TABLET (RENAL DOSING)
2.0000 | ORAL_TABLET | Freq: Two times a day (BID) | ORAL | Status: DC
  Administered 2023-07-09: 2 via ORAL
  Filled 2023-07-09: qty 20

## 2023-07-09 NOTE — ED Triage Notes (Addendum)
Pt to ED by EMS from home with c/o cough and SOB for the past 2 days. Pt has extensive resp history. Arrives on 5lnc (baseline for her). Received Albuterol treatment by EMS. Lungs are clear bilaterally. VSS, NADN. Pt has a PICC line.

## 2023-07-09 NOTE — Discharge Instructions (Addendum)
You were diagnosed with COVID-19 today.  You are likely on day 3 of this virus based on her symptoms.  We discussed the risks and benefits of starting antiviral medicine, and we have chosen to begin Paxlovid.  You will take this twice a day as prescribed until completing the course of medicine.  It is important that you stop taking your STATIN medication for the next 5 days, which is your cholesterol medicine, because of interactions with Paxlovid.  You can continue your other medications.  We talked about staying in the hospital to monitor your breathing, given your medical problems.  You strongly preferred to go home.  I thought this was reasonable because you were on your baseline oxygen levels, and your blood tests are otherwise reassuring.  Please be aware that if your breathing worsens over the next several days, you may need to return to the hospital and require admission at that time.  COVID typically causes symptoms for 5-7 days.   You should quarantine for 5 to 10 days from the start of your symptoms.  If you are symptomatically improving, with no fever for 24 hours, you can go in public with a mask after 5 days from the start of your symptoms.  *  I also prescribed you prednisone to your pharmacy to begin taking tomorrow.  These are stress dose steroids given your adrenal insufficiency issues.  You were given IV steroids in the ER tonight.

## 2023-07-09 NOTE — ED Provider Notes (Signed)
[facility] EMERGENCY DEPARTMENT AT [facility] Provider Note   CSN: [id number] Arrival date & time: 07/09/23  1859     History  Chief Complaint  Patient presents with   Cough    [name] is a 52 y.o. 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  Patient reports he has had worsening labored breathing for the for the past 3 days, including cough.  She feels that she is putting on water weight in her hands and feet.  She reports a history of heart failure as well.  She has been giving her self DuoNeb treatments at home with little relief of her breathing.  Reports that her father was tested positive for COVID, although she saw him nearly 10 days ago.  She reports she was at a crowded football game on Saturday.  Patient gave himself 3 DuoNeb treatments at home and was given additional 3 by EMS en route to the hospital  Patient reports she has transition to hospice care several months ago, with then was taken off of hospice care.  HPI     Home Medications Prior to Admission medications   Medication Sig Start Date End Date Taking? Authorizing Provider  predniSONE (DELTASONE) 10 MG tablet Take 6 tablets (60 mg total) by mouth daily with breakfast for 5 days. 07/10/23 07/15/23 Yes Reichen Hutzler, Kermit Balo, MD  albuterol (PROVENTIL) (2.5 MG/3ML) 0.083% nebulizer solution Take 3 mLs (2.5 mg total) by nebulization every 6 (six) hours as needed for wheezing or shortness of breath. 05/16/23 08/14/23  Luciano Cutter, MD  ALPRAZolam Prudy Feeler) 1 MG tablet Take 1 tablet (1 mg total) by mouth daily. 05/06/23   Mozingo,  Thereasa Solo, NP  aspirin EC 81 MG tablet Take 1 tablet (81 mg total) by mouth daily. Swallow whole. Patient taking differently: Take 81 mg by mouth at bedtime. Chewable 07/16/22   Nahser, Deloris Ping, MD  Bismuth Tribromoph-Petrolatum (XEROFORM OCCLUSIVE GAUZE STRIP) PADS Apply 1 each topically as directed. Patient not taking: Reported on 05/16/2023 09/17/22   Rolly Salter, MD  bumetanide (BUMEX) 1 MG tablet TAKE 1 TABLET BY MOUTH TWICE A DAY Patient taking differently: Take 1 mg by mouth 2 (two) times daily. 11/20/22   Nahser, Deloris Ping, MD  buPROPion (WELLBUTRIN XL) 150 MG 24 hr tablet TAKE 1 TABLET BY MOUTH EVERY DAY 05/06/23   Mozingo, Thereasa Solo, NP  clonazePAM (KLONOPIN) 1 MG tablet Take 1 tablet (1 mg total) by mouth 2 (two) times daily. Patient taking differently: Take 0.25 mg by mouth in the morning, at noon, in the evening, and at bedtime. 05/06/23   Mozingo, Thereasa Solo, NP  cyclobenzaprine (FLEXERIL) 5 MG tablet Take 1 tablet (5 mg total) by mouth 3 (three) times daily as needed for muscle spasms. 06/20/23   Leath-Warren, Sadie Haber, NP  diphenhydrAMINE (BENADRYL) 12.5 MG/5ML elixir Take 10 mLs (25 mg total) by mouth every 6 (six) hours as needed (nausea). 09/17/22   Rolly Salter, MD  EMGALITY 120 MG/ML SOSY Inject 120 mg into the skin every 28 (twenty-eight) days. Patient not taking: Reported on 05/16/2023    [provider]  EPINEPHRINE 0.3 mg/0.3 mL IJ SOAJ injection INJECT 0.3 MG INTO  THE MUSCLE AS NEEDED FOR ANAPHYLAXIS. 04/25/23   Luciano Cutter, MD  escitalopram (LEXAPRO) 20 MG tablet TAKE 1 TABLET BY MOUTH EVERY DAY IN THE EVENING Patient taking differently: Take 20 mg by mouth daily in the afternoon. Afternoon 03/08/23   Mozingo, Thereasa Solo, NP  famotidine (PEPCID) 40 MG/5ML suspension Take 2.5 mLs (20 mg total) by mouth daily. 03/30/23 05/16/23  Curatolo, Adam, DO  FLORASTOR 250 MG capsule Take 250 mg by mouth 2 (two) times daily. Mid Morning and Mid  Afternoon    [provider]  fluticasone-salmeterol (ADVAIR HFA) 230-21 MCG/ACT inhaler INHALE 2 PUFFS INTO THE LUNGS TWICE A DAY Patient not taking: Reported on 04/22/2023 04/16/23   Luciano Cutter, MD  Heparin Na, Pork, Lock Flsh PF (BD HEPARIN POSIFLUSH) 100 UNIT/ML SOLN USE 5 MLS IN PORT A CATH ONCE DAILY AFTER MEDICATION ADMINISTRATION AS A HEPLOCK AS DIRECTED. 06/10/23     hydrocortisone (CORTEF) 10 MG tablet Take 1-2 tablets (10-20 mg total) by mouth See admin instructions. Take 20 mg in the am and 10mg  in the evening. Take after completing prednisone Patient taking differently: Take 10-20 mg by mouth See admin instructions. Take 20 mg in the am at 10 am and 10 mg in the afternoon. Take after completing prednisone 09/29/22   Rolly Salter, MD  hydrOXYzine (ATARAX) 25 MG tablet Take 1 tablet (25 mg total) by mouth every 6 (six) hours as needed for anxiety. Patient not taking: Reported on 05/16/2023 01/24/23   Azucena Fallen, MD  lamoTRIgine (LAMICTAL) 200 MG tablet TAKE 1 TABLET BY MOUTH EVERYDAY AT BEDTIME 05/06/23   Mozingo, Thereasa Solo, NP  lamoTRIgine (LAMICTAL) 25 MG tablet Take two tablets every morning. 05/28/23   Mozingo, Thereasa Solo, NP  Lancets (ONETOUCH DELICA PLUS [place]) MISC 3 (three) times daily. for testing 06/12/21   [provider]  lansoprazole (PREVACID) 15 MG capsule Take 30 mg by mouth daily at 12 noon.    [provider]  LORazepam (ATIVAN) 0.5 MG tablet Take 1 tablet (0.5 mg total) by mouth 3 (three) times daily as needed for anxiety. Patient taking differently: Place 0.5 mg into feeding tube 3 (three) times daily as needed for anxiety. [employer] For Nausea 02/11/23   Mozingo, Thereasa Solo, NP  nitroGLYCERIN (NITROSTAT) 0.4 MG SL tablet DISSOLVE 1 TAB UNDER THE TONGUE EVERY 5 MINUTES AS NEEDED FOR CHEST PAIN. MAX OF 3 DOSES, THEN 911. 07/03/23   Nahser, Deloris Ping, MD  [facility] VERIO test strip 3 (three) times daily. for testing  06/12/21   [provider]  Oxycodone HCl 10 MG TABS Take 1 tablet by mouth every 6 (six) hours as needed (pain).    [provider]  OXYGEN Inhale 4-5 L/min into the lungs continuous.    [provider]  PROAIR HFA 108 [phone] Base) MCG/ACT inhaler Inhale 2 puffs into the lungs every 4 (four) hours as needed for wheezing or shortness of breath.    [provider]  Rimegepant Sulfate (NURTEC) 75 MG TBDP Take 75 mg by mouth daily as needed (Migraine).  Patient not taking: Reported on 05/16/2023    [provider]  rosuvastatin (CRESTOR) 10 MG tablet Take 10 mg by mouth in the morning. Mid morning 02/28/18   [provider]  sacubitril-valsartan (ENTRESTO) 24-26 MG TAKE 1 TABLET BY MOUTH TWICE A DAY 07/03/23   Nahser, Deloris Ping, MD  silver sulfADIAZINE (SILVADENE) 1 % cream Apply to affected area daily Patient taking  differently: Apply 1 Application topically daily as needed. 09/17/22 09/17/23  Rolly Salter, MD  Sodium Chloride Flush (NORMAL SALINE FLUSH) 0.9 % SOLN Use as directed for central venous catheter maintenance 06/10/23     Sodium Chloride Flush (NORMAL SALINE FLUSH) 0.9 % SOLN Use as directed for central venous catheter maintenance 01/31/23     sucralfate (CARAFATE) 1 GM/10ML suspension Take 1 g by mouth daily as needed (as directed for ulcers).    [provider]  SYNTHROID 100 MCG tablet Take 1 tablet (100 mcg total) by mouth every morning. Patient taking differently: Take 100 mcg by mouth every morning. * Must be name brand* 10/19/21   Serena Croissant, MD  Tiotropium Bromide Monohydrate (SPIRIVA RESPIMAT) 1.25 MCG/ACT AERS Inhale 2 puffs into the lungs daily. 05/16/23   Luciano Cutter, MD  topiramate (TOPAMAX) 50 MG tablet Take 150 mg by mouth at bedtime.  12/25/19   [provider]  zolpidem (AMBIEN CR) 12.5 MG CR tablet TAKE ONE TABLET BY MOUTH AT BEDTIME AS NEEDED FOR SLEEP. 05/14/23         Allergies    Ferrous  bisglycinate chelate [iron], Mushroom extract complex, Na ferric gluc cplx in sucrose, Cymbalta [duloxetine hcl], Hydromorphone, Ondansetron hcl, Promethazine, Succinylcholine, Buprenorphine hcl, Compazine, Duloxetine, Metoclopramide, Morphine and codeine, Ondansetron, Promethazine hcl, and Tegaderm ag mesh [silver]    Review of Systems   Review of Systems  Physical Exam Updated Vital Signs BP 121/74   Pulse (!) 108   Temp 98.9 F (37.2 C)   Resp 17   LMP  (LMP Unknown)   SpO2 97%  Physical Exam Constitutional:      General: She is not in acute distress.    Comments: Slow, stuttering speech is chronic per patient's report  HENT:     Head: Normocephalic and atraumatic.  Eyes:     Conjunctiva/sclera: Conjunctivae normal.     Pupils: Pupils are equal, round, and reactive to light.  Cardiovascular:     Rate and Rhythm: Normal rate and regular rhythm.  Pulmonary:     Effort: Pulmonary effort is normal. No respiratory distress.     Comments: 5L [place] Rhoncherous breath sounds bilaterally Abdominal:     General: There is no distension.     Tenderness: There is no abdominal tenderness.  Skin:    General: Skin is warm and dry.  Neurological:     General: No focal deficit present.     Mental Status: She is alert. Mental status is at baseline.     ED Results / Procedures / Treatments   Labs (all labs ordered are listed, but only abnormal results are displayed) Labs Reviewed  SARS CORONAVIRUS 2 BY RT PCR - Abnormal; Notable for the following components:      Result Value   SARS Coronavirus 2 by RT PCR POSITIVE (*)    All other components within normal limits  BASIC METABOLIC PANEL - Abnormal; Notable for the following components:   Potassium 3.1 (*)    Glucose, Bld 110 (*)    BUN 24 (*)    Creatinine, Ser 1.43 (*)    Calcium 8.2 (*)    GFR, Estimated 44 (*)    All other components within normal limits  CBC WITH DIFFERENTIAL/PLATELET - Abnormal; Notable for the following  components:   Hemoglobin 10.0 (*)    HCT 33.4 (*)    MCH 24.3 (*)    MCHC 29.9 (*)    RDW 18.0 (*)  All other components within normal limits  BLOOD GAS, VENOUS - Abnormal; Notable for the following components:   pCO2, Ven 43 (*)    All other components within normal limits  BRAIN NATRIURETIC PEPTIDE  TROPONIN I (HIGH SENSITIVITY)    EKG None  Radiology DG Chest 2 View  Result Date: 07/09/2023 CLINICAL DATA:  Shortness of breath EXAM: CHEST - 2 VIEW COMPARISON:  04/22/2023 FINDINGS: Right-sided central venous catheter tip at the proximal right atrium. Low lung volumes. No pleural effusion. Streaky atelectasis at the bases. Normal cardiac size. Aortic atherosclerosis. Clips in the axilla. IMPRESSION: Low lung volumes with streaky atelectasis at the bases. Electronically Signed   By: Jasmine Pang M.D.   On: 07/09/2023 19:55    Procedures Procedures    Medications Ordered in ED Medications  nirmatrelvir/ritonavir (renal dosing) (PAXLOVID) 2 tablet (2 tablets Oral Given 07/09/23 2208)  methylPREDNISolone sodium succinate (SOLU-MEDROL) 125 mg/2 mL injection 125 mg (125 mg Intravenous Given 07/09/23 2011)  acetaminophen (TYLENOL) tablet 650 mg (650 mg Oral Given 07/09/23 2202)  guaiFENesin (ROBITUSSIN) 100 MG/5ML liquid 5 mL (5 mLs Oral Given 07/09/23 2202)  benzonatate (TESSALON) capsule 100 mg (100 mg Oral Given 07/09/23 2203)    ED Course/ Medical Decision Making/ A&P                                 Medical Decision Making Amount and/or Complexity of Data Reviewed Labs: ordered. Radiology: ordered.  Risk OTC drugs. Prescription drug management.   This patient presents to the ED with concern for shortness of breath. This involves an extensive number of treatment options, and is a complaint that carries with it a high risk of complications and morbidity.  The differential diagnosis includes hypoxic respiratory failure versus pneumonia versus pleural effusion versus  other  Co-morbidities that complicate the patient evaluation: Adrenal sufficiency medical comorbidities listed above  Additional history obtained from EMS  External records from outside source obtained and reviewed including recent hospital discharge summary with extensive medical history  I ordered and personally interpreted labs.  The pertinent results include:  K 3.1, Cr at baseline 1.43, VBG wnl, BNP wnl, Trop wnl, COVID POSITIVE  I ordered imaging studies including dg chest I independently visualized and interpreted imaging which showed no lung volumes, no acute infiltrate I agree with the radiologist interpretation  The patient was maintained on a cardiac monitor.  I personally viewed and interpreted the cardiac monitored which showed an underlying rhythm of: Sinus tachycardia   I ordered medication including Solu-Medrol for COPD, cough medications ordered, Tylenol for headache, Paxlovid renally adjusted  Test Considered: Low suspicion for acute PE.  No indication for CT angiogram at this time.  After the interventions noted above, I reevaluated the patient and found that they have: stayed the same   I had an extensive discussion with the patient regarding her COVID diagnosis, which I feel is most likely explanation for her coughing and shortness of breath.  At this point she is on day 3 of symptoms most likely.  She would be a candidate for antiviral therapy, and wants to do so, and I have ordered renally dose adjusted Paxlovid.  I reviewed her home medications and advised that she hold her statin medicine, her other current medications do appear compatible with Paxil bid, although we discussed that the Synthroid medication may be less effective, I do think the benefits of Paxlovid outweigh the risks of  withholding treatment.  I also discussed the option of staying in the hospital, given her persistent coughing and general fatigue and her underlying comorbidities and lung conditions.   The patient has a very, very strong preference to go home, given her recent  stay on hospice, and is wanting to prioritize as much time as possible at home.  Given that she is stable on her 5 L nasal cannula, that her vital signs are otherwise at baseline and unremarkable, and that her labs do not show any other emergent findings, I think it would be reasonable to discharge her home.  She has the means of monitoring her heart rate and her oxygen levels.  She is aware she may need to return to the hospital if her condition worsens in the next 24 hours.  We will start her on prednisone for the next 4 to 5 days.  I am not convinced that this is in fact a COPD exacerbation, but she does have issues with adrenal insufficiency, and I think it is reasonable to prescribe steroids for several days as a stress dose while she is ill.  Dispostion:  After consideration of the diagnostic results and the patients response to treatment, I feel that the patent would benefit from close outpatient follow up.         Final Clinical Impression(s) / ED Diagnoses Final diagnoses:  Subacute cough  COVID-19    Rx / DC Orders ED Discharge Orders          Ordered    predniSONE (DELTASONE) 10 MG tablet  Daily with breakfast        07/09/23 2205              Terald Sleeper, MD 07/09/23 2212

## 2023-07-11 ENCOUNTER — Other Ambulatory Visit (HOSPITAL_COMMUNITY): Payer: Self-pay

## 2023-07-12 ENCOUNTER — Encounter: Payer: Medicare Other | Admitting: Psychiatry

## 2023-07-12 NOTE — Progress Notes (Deleted)
      [facility] Counselor/Therapist Progress Note  Patient ID: VERNIS CABACUNGAN, MRN: [id number],    Date: 07/12/2023  Time Spent: ***   Treatment Type: {CHL AMB THERAPY TYPES:[id number]}    Reported Symptoms: ***  Mental Status Exam:  Appearance:   {PSY:22683}     Behavior:  {PSY:21022743}  Motor:  {PSY:22302}  Speech/Language:   {PSY:22685}  Affect:  {PSY:22687}  Mood:  {PSY:31886}  Thought process:  {PSY:31888}  Thought content:    {PSY:805-447-0057}  Sensory/Perceptual disturbances:    {PSY:972-506-7827}  Orientation:  {PSY:30297}  Attention:  {PSY:22877}  Concentration:  {PSY:743-666-5102}  Memory:  {PSY:415-834-4403}  Fund of knowledge:   {PSY:743-666-5102}  Insight:    {PSY:743-666-5102}  Judgment:   {PSY:743-666-5102}  Impulse Control:  {PSY:743-666-5102}   Risk Assessment: Danger to Self:  {PSY:22692} Self-injurious Behavior: {PSY:22692} Danger to Others: {PSY:22692} Duty to Warn:{PSY:311194} Physical Aggression / Violence:{PSY:21197} Access to Firearms a concern: {PSY:21197} Gang Involvement:{PSY:21197}  Subjective: ***  Interventions: {PSY:214-779-8765}  Diagnosis:No diagnosis found.  Plan: ***      Mathis Fare, LCSW

## 2023-07-15 ENCOUNTER — Ambulatory Visit (HOSPITAL_COMMUNITY): Admission: RE | Admit: 2023-07-15 | Payer: BC Managed Care – PPO | Source: Ambulatory Visit

## 2023-07-15 ENCOUNTER — Encounter (HOSPITAL_COMMUNITY): Payer: Self-pay

## 2023-07-17 NOTE — Progress Notes (Signed)
This encounter was created in error - please disregard.

## 2023-07-18 ENCOUNTER — Emergency Department (HOSPITAL_BASED_OUTPATIENT_CLINIC_OR_DEPARTMENT_OTHER): Payer: BC Managed Care – PPO | Admitting: Radiology

## 2023-07-18 ENCOUNTER — Other Ambulatory Visit: Payer: Self-pay

## 2023-07-18 ENCOUNTER — Emergency Department (HOSPITAL_BASED_OUTPATIENT_CLINIC_OR_DEPARTMENT_OTHER)
Admission: EM | Admit: 2023-07-18 | Discharge: 2023-07-18 | Disposition: A | Discharge status: Home / Self Care | Payer: BC Managed Care – PPO | Attending: Emergency Medicine | Admitting: Emergency Medicine

## 2023-07-18 DIAGNOSIS — R1310 Dysphagia, unspecified: Secondary | ICD-10-CM | POA: Diagnosis not present

## 2023-07-18 DIAGNOSIS — D72829 Elevated white blood cell count, unspecified: Secondary | ICD-10-CM | POA: Diagnosis not present

## 2023-07-18 DIAGNOSIS — R011 Cardiac murmur, unspecified: Secondary | ICD-10-CM | POA: Diagnosis not present

## 2023-07-18 DIAGNOSIS — R600 Localized edema: Secondary | ICD-10-CM | POA: Diagnosis not present

## 2023-07-18 DIAGNOSIS — Z7982 Long term (current) use of aspirin: Secondary | ICD-10-CM | POA: Diagnosis not present

## 2023-07-18 DIAGNOSIS — R0789 Other chest pain: Secondary | ICD-10-CM | POA: Diagnosis not present

## 2023-07-18 DIAGNOSIS — E876 Hypokalemia: Secondary | ICD-10-CM | POA: Diagnosis not present

## 2023-07-18 DIAGNOSIS — I509 Heart failure, unspecified: Secondary | ICD-10-CM | POA: Insufficient documentation

## 2023-07-18 DIAGNOSIS — I10 Essential (primary) hypertension: Secondary | ICD-10-CM | POA: Diagnosis not present

## 2023-07-18 DIAGNOSIS — Z8616 Personal history of COVID-19: Secondary | ICD-10-CM | POA: Diagnosis not present

## 2023-07-18 LAB — CBG MONITORING, ED: Glucose-Capillary: 102 mg/dL — ABNORMAL HIGH (ref 70–99)

## 2023-07-18 LAB — BASIC METABOLIC PANEL
Anion gap: 9 (ref 5–15)
BUN: 31 mg/dL — ABNORMAL HIGH (ref 6–20)
CO2: 30 mmol/L (ref 22–32)
Calcium: 9.4 mg/dL (ref 8.9–10.3)
Chloride: 100 mmol/L (ref 98–111)
Creatinine, Ser: 1.37 mg/dL — ABNORMAL HIGH (ref 0.44–1.00)
GFR, Estimated: 46 mL/min — ABNORMAL LOW (ref >60)
Glucose, Bld: 94 mg/dL (ref 70–99)
Potassium: 3.1 mmol/L — ABNORMAL LOW (ref 3.5–5.1)
Sodium: 139 mmol/L (ref 135–145)

## 2023-07-18 LAB — CBC
HCT: 33.6 % — ABNORMAL LOW (ref 36.0–46.0)
Hemoglobin: 10.3 g/dL — ABNORMAL LOW (ref 12.0–15.0)
MCH: 24 pg — ABNORMAL LOW (ref 26.0–34.0)
MCHC: 30.7 g/dL (ref 30.0–36.0)
MCV: 78.1 fL — ABNORMAL LOW (ref 80.0–100.0)
Platelets: 337 10*3/uL (ref 150–400)
RBC: 4.3 MIL/uL (ref 3.87–5.11)
RDW: 17.8 % — ABNORMAL HIGH (ref 11.5–15.5)
WBC: 11.7 10*3/uL — ABNORMAL HIGH (ref 4.0–10.5)
nRBC: 0 % (ref 0.0–0.2)

## 2023-07-18 LAB — URINALYSIS, ROUTINE W REFLEX MICROSCOPIC
Bilirubin Urine: NEGATIVE
Glucose, UA: NEGATIVE mg/dL
Hgb urine dipstick: NEGATIVE
Ketones, ur: NEGATIVE mg/dL
Leukocytes,Ua: NEGATIVE
Nitrite: NEGATIVE
Protein, ur: NEGATIVE mg/dL
Specific Gravity, Urine: 1.019 (ref 1.005–1.030)
pH: 5.5 (ref 5.0–8.0)

## 2023-07-18 MED ORDER — GLUCAGON HCL RDNA (DIAGNOSTIC) 1 MG IJ SOLR
1.0000 mg | Freq: Once | INTRAMUSCULAR | Status: AC
  Administered 2023-07-18: 1 mg via INTRAVENOUS
  Filled 2023-07-18: qty 1

## 2023-07-18 MED ORDER — FAMOTIDINE 40 MG/5ML PO SUSR: 10.0000 mg | Freq: Every day | ORAL | 0 refills | Status: DC

## 2023-07-18 MED ORDER — LORAZEPAM 2 MG/ML IJ SOLN
1.0000 mg | Freq: Once | INTRAMUSCULAR | Status: AC
  Administered 2023-07-18: 1 mg via INTRAVENOUS
  Filled 2023-07-18: qty 1

## 2023-07-18 MED ORDER — FAMOTIDINE IN NACL 20-0.9 MG/50ML-% IV SOLN
20.0000 mg | Freq: Once | INTRAVENOUS | Status: AC
  Administered 2023-07-18: 20 mg via INTRAVENOUS
  Filled 2023-07-18: qty 50

## 2023-07-18 NOTE — Discharge Instructions (Signed)
You were seen in the ER today for concerns of difficulty swallowing. Your labs and imaging appear unremarkable. You responded well to medications here and were able to tolerate drinking water without difficulty. If symptoms worsen, return to the ER. Otherwise, follow up with your primary care provider.

## 2023-07-18 NOTE — ED Triage Notes (Signed)
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 Her husband called during triage; she was unable to talk to him and wanted him to tell me about the issue (she was talking up to this point. Pt tearful during triage.

## 2023-07-18 NOTE — ED Triage Notes (Signed)
Pt via pov from home with difficulty swallowing since yesterday. Pt was covid positive on Tuesday; states the food doesn't go down, seems to get stuck in her esophagus. Pt is on 5L [place] (baseline); reports extensive pulmonary hx. States there is some blood when she coughs. Pt alert & oriented, nad noted.

## 2023-07-18 NOTE — ED Provider Notes (Signed)
[place] [facility] AT [facility] Provider Note   CSN: [id number] Arrival date & time: 07/18/23  1057     History Chief Complaint  Patient presents with   Dysphagia    [name] is a 52 y.o. female. Patient with history of adrenal crisis, hodgkins lymphoma in remossion, GERD, and right heart failure who presents to the ED with concerns of dysphagia. States that when she is under increased stress, she begins to experience dysphagia and difficulty talking. Feels that she is unable to get food to go all the way down and gets stuck in her throat. Does not feel that she has choked on any food. Recently diagnosed with COVID-19 9 days ago. States that she is currently struggling to tolerate liquid or food intake.  Try to drink water earlier today felt that she needed to vomit due to the inability for the water to pass into her stomach.  Denies any abdominal pain, diarrhea, fever, chills.  Has previously had similar symptoms in response to increased stress or infections.  HPI     Home Medications Prior to Admission medications   Medication Sig Start Date End Date Taking? Authorizing Provider  bisoprolol (ZEBETA) 5 MG tablet Take 2.5 mg by mouth daily. 05/12/23  Yes [provider]  famotidine (PEPCID) 40 MG/5ML suspension Take 1.3 mLs (10.4 mg total) by mouth daily. 07/18/23  Yes Smitty Knudsen, PA-C  QVAR REDIHALER 80 MCG/ACT inhaler Inhale 1 puff into the lungs 2 (two) times daily. 05/10/23  Yes [provider]  albuterol (PROVENTIL) (2.5 MG/3ML) 0.083% nebulizer solution Take 3 mLs (2.5 mg total) by nebulization every 6 (six) hours as needed for wheezing or shortness of breath. 05/16/23 08/14/23  Luciano Cutter, MD  ALPRAZolam Prudy Feeler) 1 MG tablet Take 1 tablet (1 mg total) by mouth daily. 05/06/23   Mozingo, Thereasa Solo, NP  aspirin EC 81 MG tablet Take 1 tablet (81 mg total) by mouth daily. Swallow whole. Patient taking differently: Take 81 mg  by mouth at bedtime. Chewable 07/16/22   Nahser, Deloris Ping, MD  Bismuth Tribromoph-Petrolatum (XEROFORM OCCLUSIVE GAUZE STRIP) PADS Apply 1 each topically as directed. Patient not taking: Reported on 05/16/2023 09/17/22   Rolly Salter, MD  bumetanide (BUMEX) 1 MG tablet TAKE 1 TABLET BY MOUTH TWICE A DAY Patient taking differently: Take 1 mg by mouth 2 (two) times daily. 11/20/22   Nahser, Deloris Ping, MD  buPROPion (WELLBUTRIN XL) 150 MG 24 hr tablet TAKE 1 TABLET BY MOUTH EVERY DAY 05/06/23   Mozingo, Thereasa Solo, NP  clonazePAM (KLONOPIN) 1 MG tablet Take 1 tablet (1 mg total) by mouth 2 (two) times daily. Patient taking differently: Take 0.25 mg by mouth in the morning, at noon, in the evening, and at bedtime. 05/06/23   Mozingo, Thereasa Solo, NP  cyclobenzaprine (FLEXERIL) 5 MG tablet Take 1 tablet (5 mg total) by mouth 3 (three) times daily as needed for muscle spasms. 06/20/23   Leath-Warren, Sadie Haber, NP  diphenhydrAMINE (BENADRYL) 12.5 MG/5ML elixir Take 10 mLs (25 mg total) by mouth every 6 (six) hours as needed (nausea). 09/17/22   Rolly Salter, MD  EMGALITY 120 MG/ML SOSY Inject 120 mg into the skin every 28 (twenty-eight) days. Patient not taking: Reported on 05/16/2023    [provider]  EPINEPHRINE 0.3 mg/0.3 mL IJ SOAJ injection INJECT 0.3 MG INTO THE MUSCLE AS NEEDED FOR ANAPHYLAXIS. 04/25/23   Luciano Cutter, MD  escitalopram (LEXAPRO) 20 MG  tablet TAKE 1 TABLET BY MOUTH EVERY DAY IN THE EVENING Patient taking differently: Take 20 mg by mouth daily in the afternoon. Afternoon 03/08/23   Mozingo, Thereasa Solo, NP  famotidine (PEPCID) 40 MG/5ML suspension Take 2.5 mLs (20 mg total) by mouth daily. 03/30/23 05/16/23  Curatolo, Adam, DO  FLORASTOR 250 MG capsule Take 250 mg by mouth 2 (two) times daily. Mid Morning and Mid Afternoon    [provider]  fluticasone-salmeterol (ADVAIR HFA) 230-21 MCG/ACT inhaler INHALE 2 PUFFS INTO THE LUNGS TWICE A DAY Patient  not taking: Reported on 04/22/2023 04/16/23   Luciano Cutter, MD  Heparin Na, Pork, Lock Flsh PF (BD HEPARIN POSIFLUSH) 100 UNIT/ML SOLN USE 5 MLS IN PORT A CATH ONCE DAILY AFTER MEDICATION ADMINISTRATION AS A HEPLOCK AS DIRECTED. 06/10/23     hydrocortisone (CORTEF) 10 MG tablet Take 1-2 tablets (10-20 mg total) by mouth See admin instructions. Take 20 mg in the am and 10mg  in the evening. Take after completing prednisone Patient taking differently: Take 10-20 mg by mouth See admin instructions. Take 20 mg in the am at 10 am and 10 mg in the afternoon. Take after completing prednisone 09/29/22   Rolly Salter, MD  hydrOXYzine (ATARAX) 25 MG tablet Take 1 tablet (25 mg total) by mouth every 6 (six) hours as needed for anxiety. Patient not taking: Reported on 05/16/2023 01/24/23   Azucena Fallen, MD  lamoTRIgine (LAMICTAL) 200 MG tablet TAKE 1 TABLET BY MOUTH EVERYDAY AT BEDTIME 05/06/23   Mozingo, Thereasa Solo, NP  lamoTRIgine (LAMICTAL) 25 MG tablet Take two tablets every morning. 05/28/23   Mozingo, Thereasa Solo, NP  Lancets (ONETOUCH DELICA PLUS [place]) MISC 3 (three) times daily. for testing 06/12/21   [provider]  lansoprazole (PREVACID) 15 MG capsule Take 30 mg by mouth daily at 12 noon.    [provider]  LORazepam (ATIVAN) 0.5 MG tablet Take 1 tablet (0.5 mg total) by mouth 3 (three) times daily as needed for anxiety. Patient taking differently: Place 0.5 mg into feeding tube 3 (three) times daily as needed for anxiety. [employer] For Nausea 02/11/23   Mozingo, Thereasa Solo, NP  nitroGLYCERIN (NITROSTAT) 0.4 MG SL tablet DISSOLVE 1 TAB UNDER THE TONGUE EVERY 5 MINUTES AS NEEDED FOR CHEST PAIN. MAX OF 3 DOSES, THEN 911. 07/03/23   Nahser, Deloris Ping, MD  [facility] VERIO test strip 3 (three) times daily. for testing 06/12/21   [provider]  Oxycodone HCl 10 MG TABS Take 1 tablet by mouth every 6 (six) hours as needed (pain).    [provider]   OXYGEN Inhale 4-5 L/min into the lungs continuous.    [provider]  PROAIR HFA 108 [phone] Base) MCG/ACT inhaler Inhale 2 puffs into the lungs every 4 (four) hours as needed for wheezing or shortness of breath.    [provider]  rosuvastatin (CRESTOR) 10 MG tablet Take 10 mg by mouth in the morning. Mid morning 02/28/18   [provider]  sacubitril-valsartan (ENTRESTO) 24-26 MG TAKE 1 TABLET BY MOUTH TWICE A DAY 07/03/23   Nahser, Deloris Ping, MD  silver sulfADIAZINE (SILVADENE) 1 % cream Apply to affected area daily Patient taking differently: Apply 1 Application topically daily as needed. 09/17/22 09/17/23  Rolly Salter, MD  Sodium Chloride Flush (NORMAL SALINE FLUSH) 0.9 % SOLN Use as directed for central venous catheter maintenance 06/10/23     Sodium Chloride Flush (NORMAL SALINE FLUSH) 0.9 % SOLN  Use as directed for central venous catheter maintenance 01/31/23     sucralfate (CARAFATE) 1 GM/10ML suspension Take 1 g by mouth daily as needed (as directed for ulcers).    [provider]  SYNTHROID 100 MCG tablet Take 1 tablet (100 mcg total) by mouth every morning. Patient taking differently: Take 100 mcg by mouth every morning. * Must be name brand* 10/19/21   Serena Croissant, MD  Tiotropium Bromide Monohydrate (SPIRIVA RESPIMAT) 1.25 MCG/ACT AERS Inhale 2 puffs into the lungs daily. 05/16/23   Luciano Cutter, MD  topiramate (TOPAMAX) 50 MG tablet Take 150 mg by mouth at bedtime.  12/25/19   [provider]  zolpidem (AMBIEN CR) 12.5 MG CR tablet TAKE ONE TABLET BY MOUTH AT BEDTIME AS NEEDED FOR SLEEP. 05/14/23         Allergies    Ferrous bisglycinate chelate [iron], Mushroom extract complex, Na ferric gluc cplx in sucrose, Cymbalta [duloxetine hcl], Hydromorphone, Ondansetron hcl, Promethazine, Succinylcholine, Buprenorphine hcl, Compazine, Duloxetine, Metoclopramide, Morphine and codeine, Ondansetron, Promethazine hcl, and Tegaderm ag mesh [silver]     Review of Systems   Review of Systems  HENT:  Positive for trouble swallowing.   All other systems reviewed and are negative.   Physical Exam Updated Vital Signs BP (!) 105/46   Pulse 87   Temp 98 F (36.7 C) (Oral)   Resp 17   Ht 5\' 5"  (1.651 m)   Wt 77.4 kg   LMP  (LMP Unknown)   SpO2 100%   BMI 28.40 kg/m  Physical Exam Vitals and nursing note reviewed.  Constitutional:      General: She is not in acute distress.    Appearance: She is well-developed.  HENT:     Head: Normocephalic and atraumatic.  Eyes:     Conjunctiva/sclera: Conjunctivae normal.  Cardiovascular:     Rate and Rhythm: Normal rate and regular rhythm.     Heart sounds: Murmur heard.  Pulmonary:     Effort: Pulmonary effort is normal. No respiratory distress.     Breath sounds: Normal breath sounds.  Abdominal:     Palpations: Abdomen is soft.     Tenderness: There is no abdominal tenderness.  Musculoskeletal:        General: Swelling present.     Cervical back: Neck supple.     Right lower leg: Edema present.     Left lower leg: Edema present.     Comments: 2+ edema in bilateral lower extremities, not pitting  Skin:    General: Skin is warm and dry.     Capillary Refill: Capillary refill takes less than 2 seconds.  Neurological:     Mental Status: She is alert.  Psychiatric:        Mood and Affect: Mood normal.     ED Results / Procedures / Treatments   Labs (all labs ordered are listed, but only abnormal results are displayed) Labs Reviewed  BASIC METABOLIC PANEL - Abnormal; Notable for the following components:      Result Value   Potassium 3.1 (*)    BUN 31 (*)    Creatinine, Ser 1.37 (*)    GFR, Estimated 46 (*)    All other components within normal limits  CBC - Abnormal; Notable for the following components:   WBC 11.7 (*)    Hemoglobin 10.3 (*)    HCT 33.6 (*)    MCV 78.1 (*)    MCH 24.0 (*)    RDW 17.8 (*)  All other components within normal limits  CBG  MONITORING, ED - Abnormal; Notable for the following components:   Glucose-Capillary 102 (*)    All other components within normal limits  URINALYSIS, ROUTINE W REFLEX MICROSCOPIC    EKG EKG Interpretation Date/Time:  Thursday July 18 2023 12:40:21 EDT Ventricular Rate:  92 PR Interval:  137 QRS Duration:  89 QT Interval:  395 QTC Calculation: 489 R Axis:   -41  Text Interpretation: Sinus rhythm Left anterior fascicular block Abnormal R-wave progression, early transition Left ventricular hypertrophy Borderline prolonged QT interval No significant change since last tracing Confirmed by Vanetta Mulders [phone]) on 07/18/2023 2:10:35 PM  Radiology DG Chest 2 View  Result Date: 07/18/2023 CLINICAL DATA:  Dysphagia, central chest discomfort EXAM: CHEST - 2 VIEW COMPARISON:  CXR 07/09/23 FINDINGS: Right-sided central venous catheter with unchanged positioning. Surgical clips in the bilateral axilla. No pleural effusion. No pneumothorax. Unchanged cardiac and mediastinal contours. Prominent bilateral interstitial opacities could represent pulmonary venous congestion or atypical infection. No radiographically apparent displaced rib fractures. Visualized upper abdomen unremarkable. Vertebral body heights are maintained. IMPRESSION: Prominent bilateral interstitial opacities could represent pulmonary venous congestion or atypical infection. Electronically Signed   By: Lorenza Cambridge M.D.   On: 07/18/2023 15:16    Procedures Procedures   Medications Ordered in ED Medications  glucagon (human recombinant) (GLUCAGEN) injection 1 mg (1 mg Intravenous Given 07/18/23 1357)  famotidine (PEPCID) IVPB 20 mg premix (0 mg Intravenous Stopped 07/18/23 1400)  LORazepam (ATIVAN) injection 1 mg (1 mg Intravenous Given 07/18/23 1324)    ED Course/ Medical Decision Making/ A&P Clinical Course as of 07/18/23 1706  Thu Jul 18, 2023  1547 DG Chest 2 View [OZ]    Clinical Course User Index [OZ] Smitty Knudsen, PA-C                               Medical Decision Making Amount and/or Complexity of Data Reviewed Labs: ordered. Radiology: ordered. Decision-making details documented in ED Course.  Risk Prescription drug management.   This patient presents to the ED for concern of dysphagia.  Differential diagnosis includes esophagitis, Barrett's esophagitis, GERD, stroke, adrenal crisis   Lab Tests:  I Ordered, and personally interpreted labs.  The pertinent results include: CBC with mild leukocytosis at 11.7, BMP with hypokalemia 3.1 GFR decreased to 46, creatinine at 1.37, CBG slightly elevated at 102, urinalysis without signs of infection   Imaging Studies ordered:  I ordered imaging studies including chest x-ray I independently visualized and interpreted imaging which showed possible pulm vascular congestion or atypical infection I agree with the radiologist interpretation   Medicines ordered and prescription drug management:  I ordered medication including Ativan, glucagon, Pepcid for dysphagia Reevaluation of the patient after these medicines showed that the patient improved I have reviewed the patients home medicines and have made adjustments as needed   Problem List / ED Course:  Patient presents to the emergency department past history significant for [place] Ph[place] in remission, GERD, right heart failure, adrenal crisis here with concerns of dysphagia.  Reports this began earlier today.  Typically states that dysphagia progresses and develops after onset of stressful situations recent illness.  Patient diagnosed with COVID-19 9 days ago.  Was prescribed Paxlovid and take the medication as entirety and finished the medication 1 day ago or so.  Denies any relapses regarding symptoms such as fever, chills, shortness of breath, or chest  pain.  Suspect this is likely adrenal mediated however we will have to workup to rule out that there is no evidence of obstruction in the  esophagus or mass compressing the area. Patient was given a dose of Ativan, Pepcid, and glucagon for management of symptoms. On reassessment, patient reported improvement in symptoms. Able to tolerate oral intake. Suspect motility disorder involving LES, but unable to further assess this in the ED. Advised that she should follow up with GI and PCP. Regarding CXR, suspect that this is likely secondary to recent COVID-19 infection. Patient still endorsing some cough, but no productive cough or phlegm, no chest pain or shortness of breath. Also denies any fevers or chills. Advised patient to return to the ED if any new symptoms arise. Otherwise appears stable for discharge now that she is tolerating oral intake without difficulty. Patient discharged home in stable condition.  Final Clinical Impression(s) / ED Diagnoses Final diagnoses:  Dysphagia, unspecified type    Rx / DC Orders ED Discharge Orders          Ordered    famotidine (PEPCID) 40 MG/5ML suspension  Daily        07/18/23 1631              Smitty Knudsen, PA-C 07/18/23 1706    Vanetta Mulders, MD 07/19/23 [phone]

## 2023-07-22 ENCOUNTER — Telehealth: Payer: Self-pay

## 2023-07-22 ENCOUNTER — Encounter (INDEPENDENT_AMBULATORY_CARE_PROVIDER_SITE_OTHER): Payer: Self-pay | Admitting: Otolaryngology

## 2023-07-22 NOTE — Plan of Care (Signed)
[facility] Tonsillectomy/Adenoidectomy, Postoperative PEDS care plan entered in error.

## 2023-07-23 ENCOUNTER — Other Ambulatory Visit (INDEPENDENT_AMBULATORY_CARE_PROVIDER_SITE_OTHER): Payer: Self-pay | Admitting: Otolaryngology

## 2023-07-23 ENCOUNTER — Ambulatory Visit (INDEPENDENT_AMBULATORY_CARE_PROVIDER_SITE_OTHER): Payer: BC Managed Care – PPO | Admitting: Otolaryngology

## 2023-07-23 ENCOUNTER — Encounter (INDEPENDENT_AMBULATORY_CARE_PROVIDER_SITE_OTHER): Payer: Self-pay

## 2023-07-23 VITALS — Ht 65.0 in | Wt 158.0 lb

## 2023-07-23 DIAGNOSIS — Z8616 Personal history of COVID-19: Secondary | ICD-10-CM

## 2023-07-23 DIAGNOSIS — R490 Dysphonia: Secondary | ICD-10-CM

## 2023-07-23 DIAGNOSIS — R09A2 Foreign body sensation, throat: Secondary | ICD-10-CM | POA: Diagnosis not present

## 2023-07-23 DIAGNOSIS — R1310 Dysphagia, unspecified: Secondary | ICD-10-CM | POA: Diagnosis not present

## 2023-07-23 DIAGNOSIS — K219 Gastro-esophageal reflux disease without esophagitis: Secondary | ICD-10-CM

## 2023-07-23 MED ORDER — DEXLANSOPRAZOLE 60 MG PO CPDR: 60.0000 mg | DELAYED_RELEASE_CAPSULE | Freq: Every day | ORAL | 3 refills | Status: DC

## 2023-07-23 NOTE — Progress Notes (Signed)
Dear Dr. Syble Creek, Here is my assessment for our mutual patient, [name]. Thank you for allowing me the opportunity to care for your patient. Please do not hesitate to contact me should you have any other questions. Sincerely, Dr. Jovita Kussmaul  Otolaryngology Clinic Note Referring provider: Dr. Syble Creek HPI:  [name] is a 52 y.o. female kindly referred by Dr. Syble Creek for evaluation of foreign body sensation in throat. Patient was in the ER for an urgent evaluation and referred here. Patient reports that she has had some difficulty swallowing - maybe started 10 days ago. She reports that this started when she swallowed something (unclear what - maybe a pill), and it got stuck and then had trouble swallowing since then. She points to her clavicle and it is painful.  She is quite concerned and wishes to rule out a foreign body.  She does not think she has choked on any food. Feels like something got wedged, and then now feels like she can't eat. She reports that she is able to drink but having difficulty with solid foods. She does report having a cough, and coughing spell after drinking sometimes. Intermittent dysphagia  She denies fevers, but reports some epigastric burning. She has been to ED twice and was prescribed famotidine and pantoprazole. No significant weight loss. She does have some vocal breaks but reports this has been ongoing since past couple of years.  She was diagnosed with COVID - 2 weeks ago. She reports she has not had significant symptoms from this but may be related.  She has previously seen Dr. Delford Field for aspiration and voice loss, and she had a normal oropharyngeal swallowing on FEES. Prior saw Dr. Delford Field in 2016 for VF nodule and globus and swallowing.   She has chronic pulmonary respiratory failure (5L O3 at home), CHF, CKD.Marland Kitchen  Personal or FHx of bleeding dz or anesthesia difficulty: no  PMHx: Thyroid carcinoma - 2010 s/p total thyroidectomy, Addison's  disease  Independent Review of Additional Tests or Records:  WBC: 11.7 CXR 10/17: b/l interstitial opacity, no consolidation; no foreign bodies noted CT CAP 12/2022 reviewed independently: small pulm nodules (subcentimeter) Prior notes and FEES report reviewed from Dr .Delford Field [facility] Laryngology):  She has previously seen Dr. Delford Field for aspiration and voice loss, and she had a normal oropharyngeal swallowing on FEES. Prior saw Dr. Delford Field in 2016 for VF nodule and globus and swallowing.   PMH/Meds/All/SocHx/FamHx/ROS:   Past Medical History:  Diagnosis Date   Addison's disease (HCC)    Adrenal insufficiency (HCC)    Anemia    Anxiety    Aortic stenosis    Aortic stenosis    Appendicitis 12/19/09   Appendicitis    Breast cancer (HCC)    STATUS POST BILATERAL MASTECTOMY. STATUS POST RECONSTRUCTION. SHE HAD SILICONE BREAST IMPLANTS AND THE LEFT IMPLANT IS LEAKING SLIGHTLY   Cellulitis of right middle finger 11/07/2018   Cervical cancer (HCC) 12/23/2018   Chest pain    CHF with right heart failure (HCC) 04/17/2017   Chronic respiratory failure with hypoxia (HCC) 12/23/2018   Cough variant asthma 04/13/2019   Depression    GERD (gastroesophageal reflux disease)    takes Dexilant and carafate and gi coctail    Headache    migraines on a daily and monthly regimen    Heart murmur    History of kidney stones    Hodgkin lymphoma (HCC)    STATUS POST MANTLE RADIATION   Hodgkin's lymphoma (HCC)    1987  Hypertension    Hypoxia    Necrotizing fasciitis (HCC) 12/23/2018   Non-ischemic cardiomyopathy (HCC)    Osteoporosis    Palpitations    Pituitary adenoma (HCC) 12/23/2018   Pneumonia    PONV (postoperative nausea and vomiting)    Pre-diabetes    per pt; no meds   Pulmonary hypertension (HCC) 12/23/2018   Raynaud phenomenon    Right heart failure (HCC) 04/17/2017   Seizures (HCC)    last febrile seizure was approx 3 weeks ago per report on 12/01/2020   Sleep apnea    upcoming sleep study  per pt    Supplemental oxygen dependent    3 liters   SVT (supraventricular tachycardia) (HCC)    Tachycardia    Thyroid cancer (HCC)    STATUS POST SURGICAL REMOVAL-CURRENT ON THYROID REPLACEMENT     Past Surgical History:  Procedure Laterality Date   ABDOMINAL HYSTERECTOMY     AMPUTATION Left 01/30/2019   Procedure: Left Index finger amputation with flap reconstruction and repair reconstruction;  Surgeon: Dominica Severin, MD;  Location: [facility] OR;  Service: Orthopedics;  Laterality: Left;   APPENDECTOMY     breast implants and removal      breast implants but leaking      CARDIAC CATHETERIZATION  05/18/09   NORMAL CATH   COLONOSCOPY     hx of chemotherapy      hx of radiation therapy      I & D EXTREMITY Left 12/23/2018   Procedure: IRRIGATION AND DEBRIDEMENT HAND / INDEX FINGER;  Surgeon: Dominica Severin, MD;  Location: [facility] OR;  Service: Orthopedics;  Laterality: Left;   IR CV LINE INJECTION  03/22/2022   IR FLUORO GUIDE CV LINE RIGHT  10/09/2022   IR FLUORO GUIDE CV LINE RIGHT  01/18/2023   IR FLUORO GUIDE CV LINE RIGHT  03/26/2023   IR IMAGING GUIDED PORT INSERTION  05/06/2020   IR IMAGING GUIDED PORT INSERTION  12/04/2021   IR REMOVAL TUN ACCESS W/ PORT W/O FL MOD SED  04/27/2020   IR REMOVAL TUN ACCESS W/ PORT W/O FL MOD SED  12/04/2021   IR REMOVAL TUN ACCESS W/ PORT W/O FL MOD SED  03/30/2022   IR US GUIDE VASC ACCESS RIGHT  10/09/2022   KIDNEY STONE SURGERY     LUMBAR PUNCTURE W/ INTRATHECAL CHEMOTHERAPY     MASTECTOMY     PITUITARY SURGERY     RIGHT/LEFT HEART CATH AND CORONARY ANGIOGRAPHY N/A 04/02/2018   Procedure: RIGHT/LEFT HEART CATH AND CORONARY ANGIOGRAPHY;  Surgeon: Kathleene Hazel, MD;  Location: [facility] INVASIVE CV LAB;  Service: Cardiovascular;  Laterality: N/A;   RIGHT/LEFT HEART CATH AND CORONARY ANGIOGRAPHY N/A 08/31/2022   Procedure: RIGHT/LEFT HEART CATH AND CORONARY ANGIOGRAPHY;  Surgeon: Dolores Patty, MD;  Location: [facility] INVASIVE CV LAB;  Service: Cardiovascular;   Laterality: N/A;   TOOTH EXTRACTION N/A 12/05/2020   Procedure: DENTAL RESTORATION/EXTRACTIONS;  Surgeon: Lovena Neighbours, MD;  Location: [facility] ORS;  Service: Oral Surgery;  Laterality: N/A;   TOTAL THYROIDECTOMY     VIDEO BRONCHOSCOPY Bilateral 11/14/2018   Procedure: VIDEO BRONCHOSCOPY WITHOUT FLUORO;  Surgeon: Luciano Cutter, MD;  Location: [facility] ENDOSCOPY;  Service: Cardiopulmonary;  Laterality: Bilateral;   VIDEO BRONCHOSCOPY WITH ENDOBRONCHIAL ULTRASOUND N/A 11/19/2018   Procedure: VIDEO BRONCHOSCOPY WITH ENDOBRONCHIAL ULTRASOUND;  Surgeon: Luciano Cutter, MD;  Location: [facility] OR;  Service: Thoracic;  Laterality: N/A;    Family History  Family history unknown: Yes     Social  Connections: Unknown (02/12/2022)   Received from [facility]   Social Network    Social Network: Not on file       Current Outpatient Medications:    albuterol (PROVENTIL) (2.5 MG/3ML) 0.083% nebulizer solution, Take 3 mLs (2.5 mg total) by nebulization every 6 (six) hours as needed for wheezing or shortness of breath., Disp: 360 mL, Rfl: 2   ALPRAZolam (XANAX) 1 MG tablet, Take 1 tablet (1 mg total) by mouth daily., Disp: 30 tablet, Rfl: 2   aspirin EC 81 MG tablet, Take 1 tablet (81 mg total) by mouth daily. Swallow whole. (Patient taking differently: Take 81 mg by mouth at bedtime. Chewable), Disp: 90 tablet, Rfl: 3   Bismuth Tribromoph-Petrolatum (XEROFORM OCCLUSIVE GAUZE STRIP) PADS, Apply 1 each topically as directed., Disp: 50 each, Rfl: 1   bisoprolol (ZEBETA) 5 MG tablet, Take 2.5 mg by mouth daily., Disp: , Rfl:    bumetanide (BUMEX) 1 MG tablet, TAKE 1 TABLET BY MOUTH TWICE A DAY (Patient taking differently: Take 1 mg by mouth 2 (two) times daily.), Disp: 180 tablet, Rfl: 3   buPROPion (WELLBUTRIN XL) 150 MG 24 hr tablet, TAKE 1 TABLET BY MOUTH EVERY DAY, Disp: 90 tablet, Rfl: 0   clonazePAM (KLONOPIN) 1 MG tablet, Take 1 tablet (1 mg total) by mouth 2 (two) times daily. (Patient  taking differently: Take 0.25 mg by mouth in the morning, at noon, in the evening, and at bedtime.), Disp: 60 tablet, Rfl: 2   cyclobenzaprine (FLEXERIL) 5 MG tablet, Take 1 tablet (5 mg total) by mouth 3 (three) times daily as needed for muscle spasms., Disp: 20 tablet, Rfl: 0   diphenhydrAMINE (BENADRYL) 12.5 MG/5ML elixir, Take 10 mLs (25 mg total) by mouth every 6 (six) hours as needed (nausea)., Disp: 120 mL, Rfl: 0   EMGALITY 120 MG/ML SOSY, Inject 120 mg into the skin every 28 (twenty-eight) days., Disp: , Rfl:    EPINEPHRINE 0.3 mg/0.3 mL IJ SOAJ injection, INJECT 0.3 MG INTO THE MUSCLE AS NEEDED FOR ANAPHYLAXIS., Disp: 2 each, Rfl: 1   escitalopram (LEXAPRO) 20 MG tablet, TAKE 1 TABLET BY MOUTH EVERY DAY IN THE EVENING (Patient taking differently: Take 20 mg by mouth daily in the afternoon. Afternoon), Disp: 90 tablet, Rfl: 1   famotidine (PEPCID) 40 MG/5ML suspension, Take 1.3 mLs (10.4 mg total) by mouth daily., Disp: 50 mL, Rfl: 0   FLORASTOR 250 MG capsule, Take 250 mg by mouth 2 (two) times daily. Mid Morning and Mid Afternoon, Disp: , Rfl:    fluticasone-salmeterol (ADVAIR HFA) 230-21 MCG/ACT inhaler, INHALE 2 PUFFS INTO THE LUNGS TWICE A DAY, Disp: 12 each, Rfl: 5   Heparin Na, Pork, Lock Flsh PF (BD HEPARIN POSIFLUSH) 100 UNIT/ML SOLN, USE 5 MLS IN PORT A CATH ONCE DAILY AFTER MEDICATION ADMINISTRATION AS A HEPLOCK AS DIRECTED., Disp: 150 mL, Rfl: 3   hydrocortisone (CORTEF) 10 MG tablet, Take 1-2 tablets (10-20 mg total) by mouth See admin instructions. Take 20 mg in the am and 10mg  in the evening. Take after completing prednisone (Patient taking differently: Take 10-20 mg by mouth See admin instructions. Take 20 mg in the am at 10 am and 10 mg in the afternoon. Take after completing prednisone), Disp: , Rfl:    hydrOXYzine (ATARAX) 25 MG tablet, Take 1 tablet (25 mg total) by mouth every 6 (six) hours as needed for anxiety., Disp: 30 tablet, Rfl: 0   lamoTRIgine (LAMICTAL) 200 MG  tablet, TAKE 1  TABLET BY MOUTH EVERYDAY AT BEDTIME, Disp: 90 tablet, Rfl: 0   lamoTRIgine (LAMICTAL) 25 MG tablet, Take two tablets every morning., Disp: 180 tablet, Rfl: 0   Lancets (ONETOUCH DELICA PLUS LANCET33G) MISC, 3 (three) times daily. for testing, Disp: , Rfl:    lansoprazole (PREVACID) 15 MG capsule, Take 30 mg by mouth daily at 12 noon., Disp: , Rfl:    LORazepam (ATIVAN) 0.5 MG tablet, Take 1 tablet (0.5 mg total) by mouth 3 (three) times daily as needed for anxiety. (Patient taking differently: Place 0.5 mg into feeding tube 3 (three) times daily as needed for anxiety. [employer] For Nausea), Disp: 12 tablet, Rfl: 0   nitroGLYCERIN (NITROSTAT) 0.4 MG SL tablet, DISSOLVE 1 TAB UNDER THE TONGUE EVERY 5 MINUTES AS NEEDED FOR CHEST PAIN. MAX OF 3 DOSES, THEN 911., Disp: 75 tablet, Rfl: 0   ONETOUCH VERIO test strip, 3 (three) times daily. for testing, Disp: , Rfl:    Oxycodone HCl 10 MG TABS, Take 1 tablet by mouth every 6 (six) hours as needed (pain)., Disp: , Rfl:    OXYGEN, Inhale 4-5 L/min into the lungs continuous., Disp: , Rfl:    PROAIR HFA 108 (90 Base) MCG/ACT inhaler, Inhale 2 puffs into the lungs every 4 (four) hours as needed for wheezing or shortness of breath., Disp: , Rfl:    QVAR REDIHALER 80 MCG/ACT inhaler, Inhale 1 puff into the lungs 2 (two) times daily., Disp: , Rfl:    rosuvastatin (CRESTOR) 10 MG tablet, Take 10 mg by mouth in the morning. Mid morning, Disp: , Rfl:    sacubitril-valsartan (ENTRESTO) 24-26 MG, TAKE 1 TABLET BY MOUTH TWICE A DAY, Disp: 60 tablet, Rfl: 5   silver sulfADIAZINE (SILVADENE) 1 % cream, Apply to affected area daily (Patient taking differently: Apply 1 Application topically daily as needed.), Disp: 50 g, Rfl: 1   Sodium Chloride Flush (NORMAL SALINE FLUSH) 0.9 % SOLN, Use as directed for central venous catheter maintenance, Disp: 600 mL, Rfl: 3   Sodium Chloride Flush (NORMAL SALINE FLUSH) 0.9 % SOLN, Use as directed for central venous  catheter maintenance, Disp: 600 mL, Rfl: 3   sucralfate (CARAFATE) 1 GM/10ML suspension, Take 1 g by mouth daily as needed (as directed for ulcers)., Disp: , Rfl:    SYNTHROID 100 MCG tablet, Take 1 tablet (100 mcg total) by mouth every morning. (Patient taking differently: Take 100 mcg by mouth every morning. * Must be name brand*), Disp: , Rfl:    Tiotropium Bromide Monohydrate (SPIRIVA RESPIMAT) 1.25 MCG/ACT AERS, Inhale 2 puffs into the lungs daily., Disp: 4 g, Rfl: 5   topiramate (TOPAMAX) 50 MG tablet, Take 150 mg by mouth at bedtime. , Disp: , Rfl:    zolpidem (AMBIEN CR) 12.5 MG CR tablet, TAKE ONE TABLET BY MOUTH AT BEDTIME AS NEEDED FOR SLEEP., Disp: 90 tablet, Rfl: 0   famotidine (PEPCID) 40 MG/5ML suspension, Take 2.5 mLs (20 mg total) by mouth daily., Disp: 75 mL, Rfl: 0   Physical Exam:   Ht 5\' 5"  (1.651 m)   Wt 158 lb (71.7 kg)   LMP  (LMP Unknown)   BMI 26.29 kg/m    Salient findings:  CN II-XII intact Bilateral EAC clear and TM intact with well pneumatized middle ear spaces Anterior rhinoscopy: Septum relatively midline; no masses on anterior rhinoscopy No lesions of oral cavity/oropharynx No obviously palpable neck masses/lymphadenopathy/thyromegaly No respiratory distress or stridor; able to easily tolerate secretions; voice quality class II  Procedures:  Procedure Note Pre-procedure diagnosis:  Dysphagia, globus sensation; concern for foreign body Post-procedure diagnosis: Same Procedure: Transnasal Fiberoptic Laryngoscopy, CPT 78295 - Mod 25 Indication: see above Complications: None apparent EBL: 0 mL Date: 07/28/23   The procedure was necessary and undertaken to further evaluate the patient's complaint of dysphagia, globus sensation, and concern for foreign body, with mirror exam inadequate for appropriate examination due to gag reflex and poor patient tolerance  Procedure:  Patient was identified as correct patient. Verbal consent was obtained. The nose was  sprayed with oxymetazoline and 4% lidocaine. The The flexible laryngoscope was passed through the nose to view the nasal cavity, pharynx (oropharynx, hypopharynx) and larynx.  The larynx was examined at rest and during multiple phonatory tasks. Documentation was obtained and reviewed with patient. The scope was removed. The patient tolerated the procedure well.  Findings: The nasal cavity and nasopharynx did not reveal any masses or lesions, mucosa appeared to be without obvious lesions. The tongue base, pharyngeal walls, piriform sinuses, vallecula, epiglottis and postcricoid region are normal in appearance without The visualized portion of the subglottis and proximal trachea is widely patent. The vocal folds are mobile bilaterally. There are no lesions on the free edge of the vocal folds nor elsewhere in the larynx worrisome for malignancy.    Electronically signed by: Read Drivers, MD 07/28/2023 7:06 PM   Impression & Plans:  Suzi Mcglown is a 52 y.o. female with history of thyroid carcinoma and chronic respiratory failure with: Dysphagia Globus sensation 3.   Throat discomfort with concern for foreign body 4.   Vocal fatigue and dysphonia 5.   Recent COVID infection 6.   LPR  - Her TFL is reassuring, and I wonder if she did have an episode of pill dysphagia followed by globus sensation; she does report long-standing dysphagia that is getting worse. Her vocal fold exam is also reassuring overall and indicative of muscle tension pathology. She has had prior workup including a FEES but nothing recent, and given her complaints, we will obtain further workup Given her symptoms, I also suspect LPR as she has had improvement with PPI in the past and we can do an empiric trial again. She was overall quite reassured after her TFL  - Start dexilant 60mg  daily (she report she had tried this prior and was beneficial) -  MBS - f/u in 6-8 weeks after MBS; return precautions discussed - we discussed  MTD management strategies briefly  MDM:  Complexity/Problems addressed: mod - unk prognosis and exacerbatoin Data complexity: mod - independent interpretation of tests - Morbidity: mod - Prescription Drug prescribed or managed: yes    Thank you for allowing me the opportunity to care for your patient. Please do not hesitate to contact me should you have any other questions.  Sincerely, Jovita Kussmaul, MD Otolarynoglogist (ENT), [facility] Phone: [phone] Fax: [phone]  07/23/2023, 12:00 PM

## 2023-07-25 ENCOUNTER — Other Ambulatory Visit: Payer: Self-pay | Admitting: Adult Health

## 2023-07-25 ENCOUNTER — Other Ambulatory Visit (HOSPITAL_COMMUNITY): Payer: Self-pay | Admitting: *Deleted

## 2023-07-25 ENCOUNTER — Telehealth: Payer: Self-pay | Admitting: Adult Health

## 2023-07-25 DIAGNOSIS — R059 Cough, unspecified: Secondary | ICD-10-CM

## 2023-07-25 DIAGNOSIS — R1310 Dysphagia, unspecified: Secondary | ICD-10-CM

## 2023-07-25 DIAGNOSIS — F410 Panic disorder [episodic paroxysmal anxiety] without agoraphobia: Secondary | ICD-10-CM

## 2023-07-25 NOTE — Telephone Encounter (Signed)
Error

## 2023-07-25 NOTE — Telephone Encounter (Signed)
LF 9/28, due 10/28

## 2023-07-25 NOTE — Telephone Encounter (Signed)
Pt called and needs a refill on her xanax 1 mg. The pharmacy is [facility] on [address]

## 2023-07-26 ENCOUNTER — Other Ambulatory Visit (HOSPITAL_COMMUNITY): Payer: Self-pay

## 2023-07-26 ENCOUNTER — Encounter (HOSPITAL_COMMUNITY): Payer: Self-pay

## 2023-07-26 ENCOUNTER — Other Ambulatory Visit: Payer: Self-pay

## 2023-07-26 ENCOUNTER — Emergency Department (HOSPITAL_COMMUNITY): Payer: BC Managed Care – PPO

## 2023-07-26 ENCOUNTER — Emergency Department (HOSPITAL_COMMUNITY)
Admission: EM | Admit: 2023-07-26 | Discharge: 2023-07-26 | Disposition: A | Discharge status: Home / Self Care | Payer: BC Managed Care – PPO | Attending: Emergency Medicine | Admitting: Emergency Medicine

## 2023-07-26 DIAGNOSIS — Z7982 Long term (current) use of aspirin: Secondary | ICD-10-CM | POA: Diagnosis not present

## 2023-07-26 DIAGNOSIS — Z79899 Other long term (current) drug therapy: Secondary | ICD-10-CM | POA: Insufficient documentation

## 2023-07-26 DIAGNOSIS — R042 Hemoptysis: Secondary | ICD-10-CM | POA: Insufficient documentation

## 2023-07-26 DIAGNOSIS — R0602 Shortness of breath: Secondary | ICD-10-CM | POA: Diagnosis not present

## 2023-07-26 DIAGNOSIS — I110 Hypertensive heart disease with heart failure: Secondary | ICD-10-CM | POA: Diagnosis not present

## 2023-07-26 DIAGNOSIS — I509 Heart failure, unspecified: Secondary | ICD-10-CM | POA: Diagnosis not present

## 2023-07-26 DIAGNOSIS — R059 Cough, unspecified: Secondary | ICD-10-CM | POA: Diagnosis not present

## 2023-07-26 LAB — COMPREHENSIVE METABOLIC PANEL
ALT: 30 U/L (ref 0–44)
AST: 20 U/L (ref 15–41)
Albumin: 3.9 g/dL (ref 3.5–5.0)
Alkaline Phosphatase: 56 U/L (ref 38–126)
Anion gap: 13 (ref 5–15)
BUN: 30 mg/dL — ABNORMAL HIGH (ref 6–20)
CO2: 24 mmol/L (ref 22–32)
Calcium: 9.1 mg/dL (ref 8.9–10.3)
Chloride: 103 mmol/L (ref 98–111)
Creatinine, Ser: 1.25 mg/dL — ABNORMAL HIGH (ref 0.44–1.00)
GFR, Estimated: 52 mL/min — ABNORMAL LOW (ref >60)
Glucose, Bld: 169 mg/dL — ABNORMAL HIGH (ref 70–99)
Potassium: 3.2 mmol/L — ABNORMAL LOW (ref 3.5–5.1)
Sodium: 140 mmol/L (ref 135–145)
Total Bilirubin: 0.6 mg/dL (ref 0.3–1.2)
Total Protein: 7.5 g/dL (ref 6.5–8.1)

## 2023-07-26 LAB — CBC
HCT: 32.7 % — ABNORMAL LOW (ref 36.0–46.0)
Hemoglobin: 10 g/dL — ABNORMAL LOW (ref 12.0–15.0)
MCH: 24.8 pg — ABNORMAL LOW (ref 26.0–34.0)
MCHC: 30.6 g/dL (ref 30.0–36.0)
MCV: 81.1 fL (ref 80.0–100.0)
Platelets: 314 10*3/uL (ref 150–400)
RBC: 4.03 MIL/uL (ref 3.87–5.11)
RDW: 18.4 % — ABNORMAL HIGH (ref 11.5–15.5)
WBC: 11.4 10*3/uL — ABNORMAL HIGH (ref 4.0–10.5)
nRBC: 0 % (ref 0.0–0.2)

## 2023-07-26 LAB — D-DIMER, QUANTITATIVE: D-Dimer, Quant: 0.35 ug{FEU}/mL (ref 0.00–0.50)

## 2023-07-26 LAB — BRAIN NATRIURETIC PEPTIDE: B Natriuretic Peptide: 53.8 pg/mL (ref 0.0–100.0)

## 2023-07-26 MED ORDER — DOXYCYCLINE HYCLATE 100 MG PO TABS: 100.0000 mg | ORAL_TABLET | Freq: Two times a day (BID) | ORAL | 0 refills | Status: DC

## 2023-07-26 MED ORDER — NORMAL SALINE FLUSH 0.9 % IV SOLN
INTRAVENOUS | 3 refills | Status: DC
Start: 2023-07-26 — End: 2023-09-18
  Filled 2023-07-26: qty 600, 15d supply, fill #0
  Filled 2023-08-07: qty 600, 15d supply, fill #1
  Filled 2023-08-20: qty 600, 15d supply, fill #2
  Filled 2023-09-04: qty 600, 15d supply, fill #3

## 2023-07-26 NOTE — Discharge Instructions (Addendum)
Take the antibiotics as prescribed.  Follow-up with your pulmonary doctor to be rechecked.  Return to the ED for recurrent symptoms.

## 2023-07-26 NOTE — ED Notes (Signed)
Peak Flow = 100

## 2023-07-26 NOTE — ED Provider Notes (Signed)
[place] EMERGENCY DEPARTMENT AT [facility] Provider Note   CSN: [id number] Arrival date & time: 07/26/23  1330     History  Chief Complaint  Patient presents with   Shortness of Breath    [name] is a 52 y.o. female.   Shortness of Breath    Patient has a history of multiple medical problems includingAdrenal insufficiency, hypertension, Raynaud's disease, aortic stenosis, SVT, CHF, nonischemic cardiomyopathy, Hodgkin's lymphoma.  Patient has also been having issues with dysphagia with recent nasopharyngoscopy.  She also was diagnosed with COVID earlier this month.  Patient states she felt like her COVID symptoms had been improving but in the last few days she started coughing again.  Patient started to feel more short of breath.  At home today she had an episode of gross hemoptysis.  Patient has not had that before.  Home Medications Prior to Admission medications   Medication Sig Start Date End Date Taking? Authorizing Provider  doxycycline (VIBRA-TABS) 100 MG tablet Take 1 tablet (100 mg total) by mouth 2 (two) times daily. 07/26/23  Yes Linwood Dibbles, MD  albuterol (PROVENTIL) (2.5 MG/3ML) 0.083% nebulizer solution Take 3 mLs (2.5 mg total) by nebulization every 6 (six) hours as needed for wheezing or shortness of breath. 05/16/23 08/14/23  Luciano Cutter, MD  ALPRAZolam Prudy Feeler) 1 MG tablet Take 1 tablet (1 mg total) by mouth daily. 07/27/23   Mozingo, Thereasa Solo, NP  aspirin EC 81 MG tablet Take 1 tablet (81 mg total) by mouth daily. Swallow whole. Patient taking differently: Take 81 mg by mouth at bedtime. Chewable 07/16/22   Nahser, Deloris Ping, MD  Bismuth Tribromoph-Petrolatum (XEROFORM OCCLUSIVE GAUZE STRIP) PADS Apply 1 each topically as directed. 09/17/22   Rolly Salter, MD  bisoprolol (ZEBETA) 5 MG tablet Take 2.5 mg by mouth daily. 05/12/23   [provider]  bumetanide (BUMEX) 1 MG tablet TAKE 1 TABLET BY MOUTH TWICE A DAY Patient  taking differently: Take 1 mg by mouth 2 (two) times daily. 11/20/22   Nahser, Deloris Ping, MD  buPROPion (WELLBUTRIN XL) 150 MG 24 hr tablet TAKE 1 TABLET BY MOUTH EVERY DAY 05/06/23   Mozingo, Thereasa Solo, NP  clonazePAM (KLONOPIN) 1 MG tablet Take 1 tablet (1 mg total) by mouth 2 (two) times daily. Patient taking differently: Take 0.25 mg by mouth in the morning, at noon, in the evening, and at bedtime. 05/06/23   Mozingo, Thereasa Solo, NP  cyclobenzaprine (FLEXERIL) 5 MG tablet Take 1 tablet (5 mg total) by mouth 3 (three) times daily as needed for muscle spasms. 06/20/23   Leath-Warren, Sadie Haber, NP  dexlansoprazole (DEXILANT) 60 MG capsule Take 1 capsule (60 mg total) by mouth daily. 07/23/23   Read Drivers, MD  diphenhydrAMINE (BENADRYL) 12.5 MG/5ML elixir Take 10 mLs (25 mg total) by mouth every 6 (six) hours as needed (nausea). 09/17/22   Rolly Salter, MD  EMGALITY 120 MG/ML SOSY Inject 120 mg into the skin every 28 (twenty-eight) days.    [provider]  EPINEPHRINE 0.3 mg/0.3 mL IJ SOAJ injection INJECT 0.3 MG INTO THE MUSCLE AS NEEDED FOR ANAPHYLAXIS. 04/25/23   Luciano Cutter, MD  escitalopram (LEXAPRO) 20 MG tablet TAKE 1 TABLET BY MOUTH EVERY DAY IN THE EVENING Patient taking differently: Take 20 mg by mouth daily in the afternoon. Afternoon 03/08/23   Mozingo, Thereasa Solo, NP  famotidine (PEPCID) 40 MG/5ML suspension Take 2.5 mLs (20 mg total) by mouth daily.  03/30/23 05/16/23  Curatolo, Adam, DO  famotidine (PEPCID) 40 MG/5ML suspension Take 1.3 mLs (10.4 mg total) by mouth daily. 07/18/23   Smitty Knudsen, PA-C  FLORASTOR 250 MG capsule Take 250 mg by mouth 2 (two) times daily. Mid Morning and Mid Afternoon    [provider]  fluticasone-salmeterol (ADVAIR HFA) 230-21 MCG/ACT inhaler INHALE 2 PUFFS INTO THE LUNGS TWICE A DAY 04/16/23   Luciano Cutter, MD  Heparin Na, Pork, Lock Flsh PF (BD HEPARIN POSIFLUSH) 100 UNIT/ML SOLN USE 5 MLS IN PORT A CATH  ONCE DAILY AFTER MEDICATION ADMINISTRATION AS A HEPLOCK AS DIRECTED. 06/10/23     hydrocortisone (CORTEF) 10 MG tablet Take 1-2 tablets (10-20 mg total) by mouth See admin instructions. Take 20 mg in the am and 10mg  in the evening. Take after completing prednisone Patient taking differently: Take 10-20 mg by mouth See admin instructions. Take 20 mg in the am at 10 am and 10 mg in the afternoon. Take after completing prednisone 09/29/22   Rolly Salter, MD  hydrOXYzine (ATARAX) 25 MG tablet Take 1 tablet (25 mg total) by mouth every 6 (six) hours as needed for anxiety. 01/24/23   Azucena Fallen, MD  lamoTRIgine (LAMICTAL) 200 MG tablet TAKE 1 TABLET BY MOUTH EVERYDAY AT BEDTIME 05/06/23   Mozingo, Thereasa Solo, NP  lamoTRIgine (LAMICTAL) 25 MG tablet Take two tablets every morning. 05/28/23   Mozingo, Thereasa Solo, NP  Lancets (ONETOUCH DELICA PLUS [place]) MISC 3 (three) times daily. for testing 06/12/21   [provider]  lansoprazole (PREVACID) 15 MG capsule Take 30 mg by mouth daily at 12 noon.    [provider]  LORazepam (ATIVAN) 0.5 MG tablet Take 1 tablet (0.5 mg total) by mouth 3 (three) times daily as needed for anxiety. Patient taking differently: Place 0.5 mg into feeding tube 3 (three) times daily as needed for anxiety. [employer] For Nausea 02/11/23   Mozingo, Thereasa Solo, NP  nitroGLYCERIN (NITROSTAT) 0.4 MG SL tablet DISSOLVE 1 TAB UNDER THE TONGUE EVERY 5 MINUTES AS NEEDED FOR CHEST PAIN. MAX OF 3 DOSES, THEN 911. 07/03/23   Nahser, Deloris Ping, MD  [facility] VERIO test strip 3 (three) times daily. for testing 06/12/21   [provider]  Oxycodone HCl 10 MG TABS Take 1 tablet by mouth every 6 (six) hours as needed (pain).    [provider]  OXYGEN Inhale 4-5 L/min into the lungs continuous.    [provider]  PROAIR HFA 108 [phone] Base) MCG/ACT inhaler Inhale 2 puffs into the lungs every 4 (four) hours as needed for wheezing or  shortness of breath.    [provider]  QVAR REDIHALER 80 MCG/ACT inhaler Inhale 1 puff into the lungs 2 (two) times daily. 05/10/23   [provider]  rosuvastatin (CRESTOR) 10 MG tablet Take 10 mg by mouth in the morning. Mid morning 02/28/18   [provider]  sacubitril-valsartan (ENTRESTO) 24-26 MG TAKE 1 TABLET BY MOUTH TWICE A DAY 07/03/23   Nahser, Deloris Ping, MD  silver sulfADIAZINE (SILVADENE) 1 % cream Apply to affected area daily Patient taking differently: Apply 1 Application topically daily as needed. 09/17/22 09/17/23  Rolly Salter, MD  Sodium Chloride Flush (NORMAL SALINE FLUSH) 0.9 % SOLN Use as directed for central venous catheter maintenance 01/31/23     Sodium Chloride Flush (NORMAL SALINE FLUSH) 0.9 % SOLN Use as directed for central venous catheter maintenance 06/10/23     sucralfate (  CARAFATE) 1 GM/10ML suspension Take 1 g by mouth daily as needed (as directed for ulcers).    [provider]  SYNTHROID 100 MCG tablet Take 1 tablet (100 mcg total) by mouth every morning. Patient taking differently: Take 100 mcg by mouth every morning. * Must be name brand* 10/19/21   Serena Croissant, MD  Tiotropium Bromide Monohydrate (SPIRIVA RESPIMAT) 1.25 MCG/ACT AERS Inhale 2 puffs into the lungs daily. 05/16/23   Luciano Cutter, MD  topiramate (TOPAMAX) 50 MG tablet Take 150 mg by mouth at bedtime.  12/25/19   [provider]  zolpidem (AMBIEN CR) 12.5 MG CR tablet TAKE ONE TABLET BY MOUTH AT BEDTIME AS NEEDED FOR SLEEP. 05/14/23         Allergies    Ferrous bisglycinate chelate [iron], Mushroom extract complex, Na ferric gluc cplx in sucrose, Cymbalta [duloxetine hcl], Hydromorphone, Ondansetron hcl, Promethazine, Succinylcholine, Buprenorphine hcl, Compazine, Duloxetine, Metoclopramide, Morphine and codeine, Ondansetron, Promethazine hcl, and Tegaderm ag mesh [silver]    Review of Systems   Review of Systems  Respiratory:  Positive for shortness of  breath.     Physical Exam Updated Vital Signs BP (!) 144/102   Pulse (!) 109   Temp (!) 97.4 F (36.3 C) (Axillary)   Resp 20   Ht 1.651 m (5\' 5" )   Wt 72.1 kg   LMP  (LMP Unknown)   SpO2 100%   BMI 26.46 kg/m  Physical Exam Vitals and nursing note reviewed.  Constitutional:      Appearance: She is not diaphoretic.  HENT:     Head: Normocephalic and atraumatic.     Right Ear: External ear normal.     Left Ear: External ear normal.  Eyes:     General: No scleral icterus.       Right eye: No discharge.        Left eye: No discharge.     Conjunctiva/sclera: Conjunctivae normal.  Neck:     Trachea: No tracheal deviation.  Cardiovascular:     Rate and Rhythm: Normal rate and regular rhythm.  Pulmonary:     Effort: Pulmonary effort is normal. No respiratory distress.     Breath sounds: Normal breath sounds. No stridor. No wheezing or rales.  Abdominal:     General: Bowel sounds are normal. There is no distension.     Palpations: Abdomen is soft.     Tenderness: There is no abdominal tenderness. There is no guarding or rebound.  Musculoskeletal:        General: No tenderness or deformity.     Cervical back: Neck supple.     Right lower leg: No edema.     Left lower leg: No edema.  Skin:    General: Skin is warm and dry.     Findings: No rash.  Neurological:     General: No focal deficit present.     Mental Status: She is alert.     Cranial Nerves: No cranial nerve deficit, dysarthria or facial asymmetry.     Sensory: No sensory deficit.     Motor: No abnormal muscle tone or seizure activity.     Coordination: Coordination normal.  Psychiatric:        Mood and Affect: Mood normal.     ED Results / Procedures / Treatments   Labs (all labs ordered are listed, but only abnormal results are displayed) Labs Reviewed  COMPREHENSIVE METABOLIC PANEL - Abnormal; Notable for the following components:  Result Value   Potassium 3.2 (*)    Glucose, Bld 169 (*)     BUN 30 (*)    Creatinine, Ser 1.25 (*)    GFR, Estimated 52 (*)    All other components within normal limits  CBC - Abnormal; Notable for the following components:   WBC 11.4 (*)    Hemoglobin 10.0 (*)    HCT 32.7 (*)    MCH 24.8 (*)    RDW 18.4 (*)    All other components within normal limits  D-DIMER, QUANTITATIVE  BRAIN NATRIURETIC PEPTIDE    EKG EKG Interpretation Date/Time:  Friday July 26 2023 15:39:14 EDT Ventricular Rate:  103 PR Interval:  137 QRS Duration:  88 QT Interval:  368 QTC Calculation: 482 R Axis:   -32  Text Interpretation: Sinus tachycardia Probable left atrial enlargement Abnormal R-wave progression, early transition Left ventricular hypertrophy Confirmed by Linwood Dibbles [phone]) on 07/26/2023 3:43:36 PM  Radiology DG Chest Port 1 View  Result Date: 07/26/2023 CLINICAL DATA:  Cough.  Hemoptysis. EXAM: PORTABLE CHEST 1 VIEW COMPARISON:  Chest radiograph dated July 18, 2023 FINDINGS: The heart size and mediastinal contours are unchanged. Stable right-sided central venous catheter with tip in the proximal right atrium. Bilateral interstitial opacities, similar to the prior exam. No pneumothorax or pleural effusion. Surgical clips in the bilateral axilla. The visualized skeletal structures are unchanged. IMPRESSION: Similar bilateral interstitial opacities could represent atypical infection or pulmonary venous congestion. Electronically Signed   By: Hart Robinsons M.D.   On: 07/26/2023 15:54    Procedures Procedures    Medications Ordered in ED Medications - No data to display  ED Course/ Medical Decision Making/ A&P Clinical Course as of 07/26/23 1633  Fri Jul 26, 2023  1542 D-dimer, quantitative D-dimer negative [JK]  1542 Comprehensive metabolic panel(!) Metabolic panel shows stable creatinine [JK]  1542 CBC(!) Anemia noted, similar to previous values [JK]  1543 Brain natriuretic peptide BnP normal [JK]  1603 Chest x-ray shows similar  interstitial opacities [JK]    Clinical Course User Index [JK] Linwood Dibbles, MD                                 Medical Decision Making Problems Addressed: Hemoptysis: acute illness or injury that poses a threat to life or bodily functions  Amount and/or Complexity of Data Reviewed Labs: ordered. Decision-making details documented in ED Course. Radiology: ordered and independent interpretation performed.  Risk Prescription drug management.  Patient presented to the ED for evaluation of hemoptysis.  Patient has a very complicated medical history.  She has history of multiple malignancies and is currently on palliative care.  She is trying to minimize hospitalization.    Patient has had persistent coughing episodes here in the ED and no recurrent hemoptysis noted.  She denies having hematemesis.  Patient's hemoglobin is stable.  D-dimer is negative arguing against any pulmonary embolism.  Patient has persistent bilateral interstitial opacities.Marland Kitchen  BNP normal doubt pulmonary edema.  Suspect this could be related to her chronic lung findings.  Bronchitis is also a possibility as to the cause of her cough.  Will start the patient on a course of antibiotics.  Will have her follow-up with her pulmonary doctor to be checked.          Final Clinical Impression(s) / ED Diagnoses Final diagnoses:  Hemoptysis    Rx / DC Orders ED Discharge Orders  Ordered    doxycycline (VIBRA-TABS) 100 MG tablet  2 times daily        07/26/23 1628              Linwood Dibbles, MD 07/26/23 [phone]

## 2023-07-26 NOTE — ED Triage Notes (Signed)
Per EMS:  SOB Covid 2 weeks ago 5L [place] at baseline Took Duoneb at home Coughing up blood Hx of ulcers Started today Nausea Ongoing  Vomiting Ongoing Weakness Muscle spasms   BP 122/78 HR 98 Oxygen 100%  CBG 107 Temp 98.8 EMS gave tylenol

## 2023-07-29 ENCOUNTER — Other Ambulatory Visit: Payer: Self-pay

## 2023-07-29 DIAGNOSIS — F410 Panic disorder [episodic paroxysmal anxiety] without agoraphobia: Secondary | ICD-10-CM

## 2023-07-29 MED ORDER — ALPRAZOLAM 1 MG PO TABS
1.0000 mg | ORAL_TABLET | Freq: Every day | ORAL | 0 refills | Status: DC
Start: 2023-07-29 — End: 2023-08-16

## 2023-07-29 NOTE — Telephone Encounter (Signed)
Pended.

## 2023-07-31 ENCOUNTER — Ambulatory Visit (INDEPENDENT_AMBULATORY_CARE_PROVIDER_SITE_OTHER): Payer: BC Managed Care – PPO | Admitting: Psychiatry

## 2023-07-31 DIAGNOSIS — F411 Generalized anxiety disorder: Secondary | ICD-10-CM

## 2023-07-31 NOTE — Progress Notes (Signed)
[facility] Counselor/Therapist Progress Note  Patient ID: [name], MRN: [id number],    Date: 07/31/2023  Time Spent: 48 minutes   Treatment Type: Individual Therapy  Reported Symptoms:  anxiety, depression, anger, denies SI    Mental Status Exam:  Appearance:   N/a  - telehealth      Behavior:  Appropriate, Sharing, and Motivated  Motor:  Affected by her physical challenges  Speech/Language:   Clear and Coherent  Affect:  Na    telehealth  Mood:  angry, anxious, and depressed  Thought process:  goal directed  Thought content:    Rumination  Sensory/Perceptual disturbances:    WNL  Orientation:  oriented to person, place, time/date, situation, day of week, month of year, year, and stated date of Oct. 30, 2024  Attention:  Good  Concentration:  Good and Fair  Memory:  WNL "but some bouts of memory issues sometimes"  Fund of knowledge:   Good  Insight:    Good and Fair  Judgment:   Good  Impulse Control:  Good   Risk Assessment: Danger to Self:  No Self-injurious Behavior: No Danger to Others: No Duty to Warn:no Physical Aggression / Violence:No  Access to Firearms a concern: No  Gang Involvement:No   Subjective:  Patient today participating well in session, showing motivation, is alert and attentive as she worked further on the ongoing challenges she faces with her cancer. Reports symptoms of anxiety, anger, and depression "earlier" since our last contact. Was upbeat today during most of session as she shared about recent family events/interactions that went well which she felt good about.  As a result, felt there was more closeness in family.  Towards end of session she was more tearful and wanted to know what to do if she felt suicidal.  I asked her if she was feeling that currently and she said no.  She explained that what she meant was in between sessions if she felt suicidal what should she do.  I explained to her to let her husband know and be able to take  her to behavioral health or emergency services through hospital where she would be safe and would be evaluated. To have another appt with this therapist in 2-3 weeks and knows she can call if needed.  Interventions: Cognitive Behavioral Therapy and Ego-Supportive  Long-term goal:  Patient will confront her anxiety, fears, anger reported re: her illness and use time in therapy and beyond to work on this, while receiving feedback and support from therapist. Help her determine what she feels is realistic for her considering her health challenges and difficult prognosis.  Short-term goal: Participate in therapy and initiate communication of needs and desires at this point in her struggle physically.  Strategies: Share and talk through anxiety, anger, and depression that are experienced and work to find and use helpful coping tools to better manage those feelings.  Diagnosis:   ICD-10-CM   1. Generalized anxiety disorder  F41.1      Plan:  Patient continues to be motivated and participates actively in session.  States that she benefits by being able to talk very openly about her serious health condition and also to support her getting the help she needs from others as she manages her concerns in the present and into the future.  Has made some progress over time and needs to continue working with her goal-directed behaviors which help to strengthen her emotional health as much as possible.  Goal  review and progress/challenges noted with patient.  Next appointment within 3 to 4 weeks.   Mathis Fare, LCSW

## 2023-08-01 ENCOUNTER — Telehealth: Payer: BC Managed Care – PPO | Admitting: Adult Health

## 2023-08-01 ENCOUNTER — Telehealth: Payer: Self-pay | Admitting: Pulmonary Disease

## 2023-08-01 ENCOUNTER — Encounter: Payer: Self-pay | Admitting: Adult Health

## 2023-08-01 DIAGNOSIS — F410 Panic disorder [episodic paroxysmal anxiety] without agoraphobia: Secondary | ICD-10-CM

## 2023-08-01 DIAGNOSIS — F331 Major depressive disorder, recurrent, moderate: Secondary | ICD-10-CM

## 2023-08-01 DIAGNOSIS — G4700 Insomnia, unspecified: Secondary | ICD-10-CM | POA: Diagnosis not present

## 2023-08-01 DIAGNOSIS — F411 Generalized anxiety disorder: Secondary | ICD-10-CM

## 2023-08-01 MED ORDER — CLONAZEPAM 1 MG PO TABS
1.0000 mg | ORAL_TABLET | Freq: Two times a day (BID) | ORAL | 2 refills | Status: DC
Start: 2023-08-01 — End: 2023-09-19

## 2023-08-01 MED ORDER — LAMOTRIGINE 200 MG PO TABS
ORAL_TABLET | ORAL | 0 refills | Status: DC
Start: 2023-08-01 — End: 2023-09-19

## 2023-08-01 MED ORDER — BUPROPION HCL ER (XL) 150 MG PO TB24: 150.0000 mg | ORAL_TABLET | Freq: Every day | ORAL | 0 refills | Status: DC

## 2023-08-01 MED ORDER — LAMOTRIGINE 25 MG PO TABS
ORAL_TABLET | ORAL | 0 refills | Status: DC
Start: 2023-08-01 — End: 2023-09-19

## 2023-08-01 NOTE — Progress Notes (Signed)
[name] [id number] October 16, 1970 52 y.o.  Virtual Visit via Video Note  I connected with pt @ on 08/01/23 at  9:40 AM EDT by a video enabled telemedicine application and verified that I am speaking with the correct person using two identifiers.   I discussed the limitations of evaluation and management by telemedicine and the availability of in person appointments. The patient expressed understanding and agreed to proceed.  I discussed the assessment and treatment plan with the patient. The patient was provided an opportunity to ask questions and all were answered. The patient agreed with the plan and demonstrated an understanding of the instructions.   The patient was advised to call back or seek an in-person evaluation if the symptoms worsen or if the condition fails to improve as anticipated.  I provided 25 minutes of non-face-to-face time during this encounter.  The patient was located at home.  The provider was located at [facility].   Susan Gibbs, NP   Subjective:   Patient ID:  [name] is a 52 y.o. (DOB 04/14/1971) female.  Chief Complaint: No chief complaint on file.   HPI [name] presents for follow-up of MDD, GAD, insomnia, and panic attacks.  Accompanied by husband.  Feels defeated and angry - not wanting to be around other people.   Describes mood today as "not good". Pleasant. Tearful. Mood symptoms - reports depression, anxiety and irritability. Reports feeling angry. Reports panic attacks. Reports decreased worry, rumination, and over thinking. Mood is lower. Stating "I'm tired of feeling like this". Reports health continues to decline - recent hospitalizations. Family supportive. Decreased interest and motivation. Taking medications as prescribed. Working with [name] for therapy.  Energy levels low. Active, does not have a regular exercise routine with current physical disabilities. Unable to enjoy usual interests and  activities. Married. Lives with husband and son. Has a daughter - 49 in [place]. Spending time with family.  Appetite adequate. Weight loss 155 from 162 pounds. Sleeps better some nights than others. Averages 4 to 4.5 hours. Focus and concentration difficulties. Completing tasks. Managing aspects of household. Disabled since 2016. Denies SI or HI.  Denies AH or VH. Denies self harm. Denies substance use.   Previous medication trials: Wellbutrin - mean, Zoloft-not helpful   Review of Systems:  Review of Systems  Musculoskeletal:  Negative for gait problem.  Neurological:  Negative for tremors.  Psychiatric/Behavioral:         Please refer to HPI    Medications: I have reviewed the patient's current medications.  Current Outpatient Medications  Medication Sig Dispense Refill   albuterol (PROVENTIL) (2.5 MG/3ML) 0.083% nebulizer solution Take 3 mLs (2.5 mg total) by nebulization every 6 (six) hours as needed for wheezing or shortness of breath. 360 mL 2   ALPRAZolam (XANAX) 1 MG tablet Take 1 tablet (1 mg total) by mouth daily. 30 tablet 0   aspirin EC 81 MG tablet Take 1 tablet (81 mg total) by mouth daily. Swallow whole. (Patient taking differently: Take 81 mg by mouth at bedtime. Chewable) 90 tablet 3   Bismuth Tribromoph-Petrolatum (XEROFORM OCCLUSIVE GAUZE STRIP) PADS Apply 1 each topically as directed. 50 each 1   bisoprolol (ZEBETA) 5 MG tablet Take 2.5 mg by mouth daily.     bumetanide (BUMEX) 1 MG tablet TAKE 1 TABLET BY MOUTH TWICE A DAY (Patient taking differently: Take 1 mg by mouth 2 (two) times daily.) 180 tablet 3   buPROPion (WELLBUTRIN XL) 150  MG 24 hr tablet Take 1 tablet (150 mg total) by mouth daily. 90 tablet 0   clonazePAM (KLONOPIN) 1 MG tablet Take 1 tablet (1 mg total) by mouth 2 (two) times daily. 60 tablet 2   cyclobenzaprine (FLEXERIL) 5 MG tablet Take 1 tablet (5 mg total) by mouth 3 (three) times daily as needed for muscle spasms. 20 tablet 0    dexlansoprazole (DEXILANT) 60 MG capsule Take 1 capsule (60 mg total) by mouth daily. 30 capsule 3   diphenhydrAMINE (BENADRYL) 12.5 MG/5ML elixir Take 10 mLs (25 mg total) by mouth every 6 (six) hours as needed (nausea). 120 mL 0   doxycycline (VIBRA-TABS) 100 MG tablet Take 1 tablet (100 mg total) by mouth 2 (two) times daily. 14 tablet 0   EMGALITY 120 MG/ML SOSY Inject 120 mg into the skin every 28 (twenty-eight) days.     EPINEPHRINE 0.3 mg/0.3 mL IJ SOAJ injection INJECT 0.3 MG INTO THE MUSCLE AS NEEDED FOR ANAPHYLAXIS. 2 each 1   escitalopram (LEXAPRO) 20 MG tablet TAKE 1 TABLET BY MOUTH EVERY DAY IN THE EVENING (Patient taking differently: Take 20 mg by mouth daily in the afternoon. Afternoon) 90 tablet 1   famotidine (PEPCID) 40 MG/5ML suspension Take 2.5 mLs (20 mg total) by mouth daily. 75 mL 0   famotidine (PEPCID) 40 MG/5ML suspension Take 1.3 mLs (10.4 mg total) by mouth daily. 50 mL 0   FLORASTOR 250 MG capsule Take 250 mg by mouth 2 (two) times daily. Mid Morning and Mid Afternoon     fluticasone-salmeterol (ADVAIR HFA) 230-21 MCG/ACT inhaler INHALE 2 PUFFS INTO THE LUNGS TWICE A DAY 12 each 5   Heparin Na, Pork, Lock Flsh PF (BD HEPARIN POSIFLUSH) 100 UNIT/ML SOLN USE 5 MLS IN PORT A CATH ONCE DAILY AFTER MEDICATION ADMINISTRATION AS A HEPLOCK AS DIRECTED. 150 mL 3   hydrocortisone (CORTEF) 10 MG tablet Take 1-2 tablets (10-20 mg total) by mouth See admin instructions. Take 20 mg in the am and 10mg  in the evening. Take after completing prednisone (Patient taking differently: Take 10-20 mg by mouth See admin instructions. Take 20 mg in the am at 10 am and 10 mg in the afternoon. Take after completing prednisone)     hydrOXYzine (ATARAX) 25 MG tablet Take 1 tablet (25 mg total) by mouth every 6 (six) hours as needed for anxiety. 30 tablet 0   lamoTRIgine (LAMICTAL) 200 MG tablet TAKE 1 TABLET BY MOUTH EVERYDAY AT BEDTIME 90 tablet 0   lamoTRIgine (LAMICTAL) 25 MG tablet Take two tablets  every morning. 180 tablet 0   Lancets (ONETOUCH DELICA PLUS LANCET33G) MISC 3 (three) times daily. for testing     lansoprazole (PREVACID) 15 MG capsule Take 30 mg by mouth daily at 12 noon.     LORazepam (ATIVAN) 0.5 MG tablet Take 1 tablet (0.5 mg total) by mouth 3 (three) times daily as needed for anxiety. (Patient taking differently: Place 0.5 mg into feeding tube 3 (three) times daily as needed for anxiety. Access Port For Nausea) 12 tablet 0   nitroGLYCERIN (NITROSTAT) 0.4 MG SL tablet DISSOLVE 1 TAB UNDER THE TONGUE EVERY 5 MINUTES AS NEEDED FOR CHEST PAIN. MAX OF 3 DOSES, THEN 911. 75 tablet 0   ONETOUCH VERIO test strip 3 (three) times daily. for testing     Oxycodone HCl 10 MG TABS Take 1 tablet by mouth every 6 (six) hours as needed (pain).     OXYGEN Inhale 4-5 L/min into the lungs  continuous.     PROAIR HFA 108 (90 Base) MCG/ACT inhaler Inhale 2 puffs into the lungs every 4 (four) hours as needed for wheezing or shortness of breath.     QVAR REDIHALER 80 MCG/ACT inhaler Inhale 1 puff into the lungs 2 (two) times daily.     rosuvastatin (CRESTOR) 10 MG tablet Take 10 mg by mouth in the morning. Mid morning     sacubitril-valsartan (ENTRESTO) 24-26 MG TAKE 1 TABLET BY MOUTH TWICE A DAY 60 tablet 5   silver sulfADIAZINE (SILVADENE) 1 % cream Apply to affected area daily (Patient taking differently: Apply 1 Application topically daily as needed.) 50 g 1   Sodium Chloride Flush (NORMAL SALINE FLUSH) 0.9 % SOLN Use as directed for central venous catheter maintenance 600 mL 3   Sodium Chloride Flush (NORMAL SALINE FLUSH) 0.9 % SOLN Use as directed for central venous catheter maintenance 600 mL 3   sucralfate (CARAFATE) 1 GM/10ML suspension Take 1 g by mouth daily as needed (as directed for ulcers).     SYNTHROID 100 MCG tablet Take 1 tablet (100 mcg total) by mouth every morning. (Patient taking differently: Take 100 mcg by mouth every morning. * Must be name brand*)     Tiotropium Bromide  Monohydrate (SPIRIVA RESPIMAT) 1.25 MCG/ACT AERS Inhale 2 puffs into the lungs daily. 4 g 5   topiramate (TOPAMAX) 50 MG tablet Take 150 mg by mouth at bedtime.      zolpidem (AMBIEN CR) 12.5 MG CR tablet TAKE ONE TABLET BY MOUTH AT BEDTIME AS NEEDED FOR SLEEP. 90 tablet 0   No current facility-administered medications for this visit.    Medication Side Effects: None  Allergies:  Allergies  Allergen Reactions   Ferrous Bisglycinate Chelate [Iron] Anaphylaxis and Other (See Comments)    Only IV - only FERRLICET   Mushroom Extract Complex Anaphylaxis   Na Ferric Gluc Cplx In Sucrose Anaphylaxis   Cymbalta [Duloxetine Hcl] Swelling and Anxiety   Hydromorphone Other (See Comments)    BP drop and heart rate drops 5.1.20 PT REPORTS THAT SHE TAKES DILAUDID AT HOME   Ondansetron Hcl Other (See Comments)   Promethazine Other (See Comments)    Other reaction(s): Unknown   Succinylcholine Other (See Comments)    Lock Jaw   Buprenorphine Hcl Hives   Compazine Other (See Comments)    Altered mental status Aggression   Duloxetine     Other reaction(s): Not available   Metoclopramide Other (See Comments)    Dystonia   Morphine And Codeine Hives   Ondansetron Hives and Rash    Other reaction(s): Unknown Other reaction(s): Unknown   Promethazine Hcl Hives   Tegaderm Ag Mesh [Silver] Rash    Old formulation only, is able to tolerate new formulation    Past Medical History:  Diagnosis Date   Addison's disease (HCC)    Adrenal insufficiency (HCC)    Anemia    Anxiety    Aortic stenosis    Aortic stenosis    Appendicitis 12/19/09   Appendicitis    Breast cancer (HCC)    STATUS POST BILATERAL MASTECTOMY. STATUS POST RECONSTRUCTION. SHE HAD SILICONE BREAST IMPLANTS AND THE LEFT IMPLANT IS LEAKING SLIGHTLY   Cellulitis of right middle finger 11/07/2018   Cervical cancer (HCC) 12/23/2018   Chest pain    CHF with right heart failure (HCC) 04/17/2017   Chronic respiratory failure with  hypoxia (HCC) 12/23/2018   Cough variant asthma 04/13/2019   Depression    GERD (  gastroesophageal reflux disease)    takes Dexilant and carafate and gi coctail    Headache    migraines on a daily and monthly regimen    Heart murmur    History of kidney stones    Hodgkin lymphoma (HCC)    STATUS POST MANTLE RADIATION   Hodgkin's lymphoma (HCC)    1987   Hypertension    Hypoxia    Necrotizing fasciitis (HCC) 12/23/2018   Non-ischemic cardiomyopathy (HCC)    Osteoporosis    Palpitations    Pituitary adenoma (HCC) 12/23/2018   Pneumonia    PONV (postoperative nausea and vomiting)    Pre-diabetes    per pt; no meds   Pulmonary hypertension (HCC) 12/23/2018   Raynaud phenomenon    Right heart failure (HCC) 04/17/2017   Seizures (HCC)    last febrile seizure was approx 3 weeks ago per report on 12/01/2020   Sleep apnea    upcoming sleep study per pt    Supplemental oxygen dependent    3 liters   SVT (supraventricular tachycardia) (HCC)    Tachycardia    Thyroid cancer (HCC)    STATUS POST SURGICAL REMOVAL-CURRENT ON THYROID REPLACEMENT    Family History  Family history unknown: Yes    Social History   Socioeconomic History   Marital status: Married    Spouse name: Not on file   Number of children: Not on file   Years of education: Not on file   Highest education level: Not on file  Occupational History   Not on file  Tobacco Use   Smoking status: Never   Smokeless tobacco: Never  Vaping Use   Vaping status: Never Used  Substance and Sexual Activity   Alcohol use: Not Currently    Comment: social    Drug use: No   Sexual activity: Not Currently    Birth control/protection: Surgical  Other Topics Concern   Not on file  Social History Narrative   Not on file   Social Determinants of Health   Financial Resource Strain: Medium Risk (08/21/2017)   Received from [facility], [facility]   Overall Financial Resource Strain (CARDIA)     Difficulty of Paying Living Expenses: Somewhat hard  Food Insecurity: No Food Insecurity (04/23/2023)   Hunger Vital Sign    Worried About Running Out of Food in the Last Year: Never true    Ran Out of Food in the Last Year: Never true  Transportation Needs: No Transportation Needs (04/23/2023)   PRAPARE - Administrator, Civil Service (Medical): No    Lack of Transportation (Non-Medical): No  Physical Activity: Unknown (08/21/2017)   [facility], [facility]   Exercise Vital Sign    Days of Exercise per Week: 0 days    Minutes of Exercise per Session: Not on file  Stress: Stress Concern Present (08/21/2017)   Received from [facility], [facility]   [employer] of Occupational Health - Occupational Stress Questionnaire    Feeling of Stress : Very much  Social Connections: Unknown (02/12/2022)   Received from [facility], [facility]   Social Network    Social Network: Not on file  Intimate Partner Violence: Not At Risk (04/23/2023)   Humiliation, Afraid, Rape, and Kick questionnaire    Fear of Current or Ex-Partner: No    Emotionally Abused: No    Physically Abused: No    Sexually  Abused: No    Past Medical History, Surgical history, Social history, and Family history were reviewed and updated as appropriate.   Please see review of systems for further details on the patient's review from today.   Objective:   Physical Exam:  LMP  (LMP Unknown)   Physical Exam Constitutional:      General: She is not in acute distress. Musculoskeletal:        General: No deformity.  Neurological:     Mental Status: She is alert and oriented to person, place, and time.     Coordination: Coordination normal.  Psychiatric:        Attention and Perception: Attention and perception normal. She does not perceive auditory or visual hallucinations.        Mood and Affect: Affect is not  labile, blunt, angry or inappropriate.        Speech: Speech normal.        Behavior: Behavior normal.        Thought Content: Thought content normal. Thought content is not paranoid or delusional. Thought content does not include homicidal or suicidal ideation. Thought content does not include homicidal or suicidal plan.        Cognition and Memory: Cognition and memory normal.        Judgment: Judgment normal.     Comments: Insight intact     Lab Review:     Component Value Date/Time   NA 140 07/26/2023 1433   NA 140 08/20/2022 1513   K 3.2 (L) 07/26/2023 1433   CL 103 07/26/2023 1433   CO2 24 07/26/2023 1433   GLUCOSE 169 (H) 07/26/2023 1433   BUN 30 (H) 07/26/2023 1433   BUN 28 (H) 08/20/2022 1513   CREATININE 1.25 (H) 07/26/2023 1433   CREATININE 1.08 (H) 02/09/2022 1202   CALCIUM 9.1 07/26/2023 1433   PROT 7.5 07/26/2023 1433   PROT 7.1 01/23/2018 1225   ALBUMIN 3.9 07/26/2023 1433   ALBUMIN 4.4 01/23/2018 1225   AST 20 07/26/2023 1433   AST 26 02/09/2022 1202   ALT 30 07/26/2023 1433   ALT 40 02/09/2022 1202   ALKPHOS 56 07/26/2023 1433   BILITOT 0.6 07/26/2023 1433   BILITOT 0.4 02/09/2022 1202   GFRNONAA 52 (L) 07/26/2023 1433   GFRNONAA >60 02/09/2022 1202   GFRAA >60 04/27/2020 0613   GFRAA >60 02/10/2020 1020       Component Value Date/Time   WBC 11.4 (H) 07/26/2023 1433   RBC 4.03 07/26/2023 1433   HGB 10.0 (L) 07/26/2023 1433   HGB 10.6 (L) 08/20/2022 1513   HCT 32.7 (L) 07/26/2023 1433   HCT 34.4 08/20/2022 1513   PLT 314 07/26/2023 1433   PLT 414 08/20/2022 1513   MCV 81.1 07/26/2023 1433   MCV 78 (L) 08/20/2022 1513   MCH 24.8 (L) 07/26/2023 1433   MCHC 30.6 07/26/2023 1433   RDW 18.4 (H) 07/26/2023 1433   RDW 16.5 (H) 08/20/2022 1513   LYMPHSABS 1.5 07/09/2023 2013   MONOABS 0.8 07/09/2023 2013   EOSABS 0.1 07/09/2023 2013   BASOSABS 0.1 07/09/2023 2013    No results found for: "POCLITH", "LITHIUM"   No results found for: "PHENYTOIN",  "PHENOBARB", "VALPROATE", "CBMZ"   .res Assessment: Plan:    Plan:  PDMP reviewed  Lexapro 20mg  daily Clonazepam 0.5mg  - 4 times daily. Xanax 1mg  daily as needed for extreme anxiety. Wellbutrin XL 150mg  every morning - did not tolerate increase in dose - reports seizure history  Lamictal 200mg  daily Lamictal 50mg  every morning  Will do a chart review of previous medications and suggest additional medication.  Seeing [name] therapy visits.  Discussed potential benefits, risk, and side effects of benzodiazepines to include potential risk of tolerance and dependence, as well as possible drowsiness.  Advised patient not to drive if experiencing drowsiness and to take lowest possible effective dose to minimize risk of dependence and tolerance.   Counseled patient regarding potential benefits, risks, and side effects of Lamictal to include potential risk of Stevens-Johnson syndrome. Advised patient to stop taking Lamictal and contact office immediately if rash develops and to seek urgent medical attention if rash is severe and/or spreading quickly.  RTC 3 to 4 weeks  Patient advised to contact office with any questions, adverse effects, or acute worsening in signs and symptoms.  Diagnoses and all orders for this visit:  Major depressive disorder, recurrent episode, moderate (HCC) -     lamoTRIgine (LAMICTAL) 200 MG tablet; TAKE 1 TABLET BY MOUTH EVERYDAY AT BEDTIME -     lamoTRIgine (LAMICTAL) 25 MG tablet; Take two tablets every morning.  Panic attacks -     clonazePAM (KLONOPIN) 1 MG tablet; Take 1 tablet (1 mg total) by mouth 2 (two) times daily.  Generalized anxiety disorder  Insomnia, unspecified type  Other orders -     buPROPion (WELLBUTRIN XL) 150 MG 24 hr tablet; Take 1 tablet (150 mg total) by mouth daily.     Please see After Visit Summary for patient specific instructions.  Future Appointments  Date Time Provider Department Center  08/08/2023  1:00 PM  [facility] SPEECH THERAPIST [facility] [place]  08/08/2023  1:00 PM [facility] R/F 2 [facility] [place]  08/22/2023 11:00 AM Mathis Fare, LCSW [facility] None  09/10/2023 11:30 AM [address] [facility] None    No orders of the defined types were placed in this encounter.     -------------------------------

## 2023-08-01 NOTE — Telephone Encounter (Signed)
Patient has been in and out of the hospital and is due for an appointment with Dr.Ellison in December but nothing is available. Please reach out to patient if something becomes available.

## 2023-08-03 NOTE — Telephone Encounter (Signed)
Dr. Everardo All, please advise on this if pt might be able to be worked into your schedule due to the fact that she has been in and out of the hospital recently.

## 2023-08-07 ENCOUNTER — Other Ambulatory Visit (HOSPITAL_COMMUNITY): Payer: Self-pay

## 2023-08-07 ENCOUNTER — Telehealth: Payer: Self-pay

## 2023-08-07 NOTE — Telephone Encounter (Signed)
Patient called crying. Was not able to understand most of what she said. She said she couldn't take it anymore, but denied being SI and said she didn't want/need to go to the hospital. I asked questions but she was crying so hard that I couldn't understand what she was saying. I did ask again if she was having SI and again she denied.   She was last seen 10/31. Lexapro 20mg  daily Clonazepam 0.5mg  - 4 times daily. Xanax 1mg  daily as needed for extreme anxiety. Wellbutrin XL 150mg  every morning - did not tolerate increase in dose - reports seizure history Lamictal 200mg  daily Lamictal 50mg  every morning

## 2023-08-07 NOTE — Telephone Encounter (Signed)
Patient stated she would like to leave a message- Had to go to the ER three times within the last 10 days- tested positive for COVID, having crisis episodes and in and out of sepsis.   Patient is scheduled for 12/11 at 4pm

## 2023-08-08 ENCOUNTER — Encounter (HOSPITAL_COMMUNITY): Payer: BC Managed Care – PPO

## 2023-08-08 ENCOUNTER — Other Ambulatory Visit (HOSPITAL_COMMUNITY): Payer: Self-pay

## 2023-08-09 ENCOUNTER — Other Ambulatory Visit (HOSPITAL_COMMUNITY): Payer: Self-pay

## 2023-08-12 NOTE — Telephone Encounter (Signed)
Patient does have an appt with Deb 11/21, have asked admin to see if she has an earlier appt. Based on what patient told me last week the PA are due to situational issues (health and relationships with children), but husband said they are all the time. She is taking clonazepam 1 mg BID and alprazolam 1 mg daily.

## 2023-08-12 NOTE — Telephone Encounter (Signed)
Will you check to see if [name] has availability for patient to be seen sooner please.

## 2023-08-13 ENCOUNTER — Ambulatory Visit (INDEPENDENT_AMBULATORY_CARE_PROVIDER_SITE_OTHER): Payer: BC Managed Care – PPO | Admitting: Psychiatry

## 2023-08-13 ENCOUNTER — Telehealth: Payer: Self-pay | Admitting: Adult Health

## 2023-08-13 DIAGNOSIS — F411 Generalized anxiety disorder: Secondary | ICD-10-CM

## 2023-08-13 DIAGNOSIS — F331 Major depressive disorder, recurrent, moderate: Secondary | ICD-10-CM

## 2023-08-13 NOTE — Telephone Encounter (Signed)
Contacted pt to discuss her situation and what symptoms she is having and what we can do to help her. She reports at her office visit with Almira Coaster on 10/31 she mentioned to her that her anxiety was not stable and her panic attacks were lasting longer. I asked if her Clonazepam 1 mg tablet that she takes bid was effective, she reports normally it helps her anxiety but she has been in this panicky state and is not feeling any reprieve from it. She is prescribed Alprazolam 1 mg tablet to take daily for any panic attacks which is not taking it away. She reports trying to relax and take deep breaths and other things she has been advised to do but nothing is helping. I asked about her sleep and if she is getting any, she reports she is not sleeping well maybe 2 hours at a time interrupted then she gets up and goes somewhere else in the house or on the porch to not bother her husband. She said she sleeps best when her husband and son are not in the house and its very quiet. She does take Ambien CR 12.5 mg for sleep but doesn't keep her asleep. I told her with her mind constantly going and not getting rest at night like she should is probably exacerbating this anxiety and panic. I asked if she was taking any pain medication such as Oxycodone to help with her medical issues and she reports no and doesn't want to take anything. I did pull up her PMP and her last refill for Oxycodone was end of July 2024. She was tearful on the phone, aggravated and frustrated she can't get more help. She does see [name] for counseling and she mentioned talking to her this morning.  Informed her I would discuss with Almira Coaster and follow up, she wants her husband contacted instead because she is just exhausted, Richard at [phone]

## 2023-08-13 NOTE — Progress Notes (Signed)
[facility] Counselor/Therapist Progress Note  Patient ID: [name], MRN: [id number],    Date: 08/13/2023  Time Spent: 45 minutes   Treatment Type: Individual Therapy  Virtual Visit via Telehealth Note; MyChart Video session Connected with patient by a telemedicine/telehealth application, with their informed consent, and verified patient privacy and that I am speaking with the correct person using two identifiers. I discussed the limitations, risks, security and privacy concerns of performing psychotherapy and the availability of in person appointments. I also discussed with the patient that there may be a patient responsible charge related to this service. The patient expressed understanding and agreed to proceed. I discussed the treatment planning with the patient. The patient was provided an opportunity to ask questions and all were answered. The patient agreed with the plan and demonstrated an understanding of the instructions. The patient was advised to call  our office if  symptoms worsen or feel they are in a crisis state and need immediate contact.   Therapist Location: office Patient Location: home   Reported Symptoms: depressed, angry, anxiety and panic, irritable   Mental Status Exam:  Appearance:   Casual     Behavior:  Sharing and Blaming  Motor:  Affected by her cancer  Speech/Language:   Clear and Coherent  Affect:  Depressed and anxious, irritable  Mood:  angry, anxious, depressed, and irritable  Thought process:  Difficulty staying on topic, blaming  Thought content:    WNL  Sensory/Perceptual disturbances:    WNL  Orientation:  oriented to person, place, time/date, situation, day of week, month of year, year, and stated date of Nov. 12, 2024  Attention:  Good  Concentration:  Good and Fair  Memory:  WNL  Fund of knowledge:   Good  Insight:    Fair  Judgment:   Good and Fair  Impulse Control:  Good and Fair   Risk Assessment: Danger to Self:   No Self-injurious Behavior: No Danger to Others: No Duty to Warn:no Physical Aggression / Violence:No  Access to Firearms a concern: No  Gang Involvement:No   Subjective:  Patient today in video session reporting depression and anger, anger at medical personnel who have tried to be helpful to patient. Angry, resentful, blaming of people trying to help her. After lengthy expression of anger, patient was able to talk more calmly and be better understood and hopefully felt more supported by this therapist.  I made sure patient has a return appointment soon and encouraged her to follow through on her plan to contact med provider if/when needed.  Alert and attentive today and needed to discharge some of her frustration.  Reviewed goal-directed behaviors with patient and tried to ensure that she felt heard and supported in the issues with which she is dealing physically and emotionally.  Will pick up again at next appointment and patient knows she can call in the interim if needed.  Interventions: Cognitive Behavioral Therapy and Ego-Supportive  Long-term goal:  Patient will confront her anxiety, fears, anger reported re: her illness and use time in therapy and beyond to work on this, while receiving feedback and support from therapist. Help her determine what she feels is realistic for her considering her health challenges and difficult prognosis.  Short-term goal: Participate in therapy and initiate communication of needs and desires at this point in her struggle physically.  Strategies: Share and talk through anxiety, anger, and depression that are experienced and work to find and use helpful coping tools  to better manage those feelings.  Diagnosis:   ICD-10-CM   1. Generalized anxiety disorder  F41.1      Plan: Patient actively participated in session today although frustrated and some anger early on in session, where she primarily needed to feel heard.  She has continued to make some progress  over time and needs to continue working with goal-directed behaviors that help support her emotional health.  Goal review and progress/challenges noted with patient.  Next appt within 2-3 weeks.   Mathis Fare, LCSW

## 2023-08-13 NOTE — Telephone Encounter (Signed)
PT lvm to return traci call please call her back at [phone]

## 2023-08-14 ENCOUNTER — Other Ambulatory Visit: Payer: Self-pay

## 2023-08-14 MED ORDER — OLANZAPINE 2.5 MG PO TABS: 2.5000 mg | ORAL_TABLET | Freq: Every day | ORAL | 1 refills | Status: DC

## 2023-08-14 NOTE — Telephone Encounter (Signed)
Returned call to husband, Gerlene Burdock and discussed the medication changes, he verbalized understanding. Will submit Rx's to their [facility] on [address]

## 2023-08-15 ENCOUNTER — Telehealth (HOSPITAL_COMMUNITY): Payer: Self-pay | Admitting: *Deleted

## 2023-08-15 ENCOUNTER — Ambulatory Visit (INDEPENDENT_AMBULATORY_CARE_PROVIDER_SITE_OTHER): Payer: BC Managed Care – PPO | Admitting: Psychiatry

## 2023-08-15 DIAGNOSIS — F331 Major depressive disorder, recurrent, moderate: Secondary | ICD-10-CM

## 2023-08-15 NOTE — Progress Notes (Signed)
      [facility] Counselor/Therapist Progress Note  Patient ID: [name], MRN: [id number],    Date: 08/15/2023  Time Spent: 53 minutes   Treatment Type: Individual Therapy  Reported Symptoms: anxiety, depressed, angry, irritable, tearful   Mental Status Exam:  Appearance:   Casual     Behavior:  Sharing  Motor:  Sitting in her bed during session  Speech/Language:   Clear and Coherent  Affect:  Depressed and Tearful  Mood:  angry, depressed, and irritable  Thought process:  goal directed  Thought content:    WNL  Sensory/Perceptual disturbances:    WNL  Orientation:  oriented to person, place, time/date, situation, day of week, month of year, year, and stated date of Nov. 14, 2024  Attention:  Good  Concentration:  Good and Fair  Memory:  WNL  Fund of knowledge:   Good  Insight:    Fair  Judgment:   Good and Fair  Impulse Control:  Good and Fair   Risk Assessment: Danger to Self:  No Self-injurious Behavior: No Danger to Others: No Duty to Warn:no Physical Aggression / Violence:No  Access to Firearms a concern: No  Gang Involvement:No   Subjective:  Patient today in video session reporting anxiety, depression, upset re: meds. Was able to reach out to [facility], DNP, patient's med provider and she was able to get patient the meds she needs more urgently.  Patient able to vent freely today about some fears and sadness as well as anger and frustration.  This seemed to help her quite a bit in addition to knowing that her medication issue has been resolved at least for the next few days and they may look at other changes/adjustments a bit later.  Good work in terms of her long-term goal and confronting her anxiety, fears, and anger.  Initially started out more angry and resentful and blaming however towards end of session had calm down and felt better after expressing her feelings more openly and also getting her medication adjustments settled.  Review of goal-directed  behaviors with patient.  Interventions: Cognitive Behavioral Therapy, Ego-Supportive, and Grief Therapy  Long-term goal:  Patient will confront her anxiety, fears, anger reported re: her illness and use time in therapy and beyond to work on this, while receiving feedback and support from therapist. Help her determine what she feels is realistic for her considering her health challenges and difficult prognosis.  Short-term goal: Participate in therapy and initiate communication of needs and desires at this point in her struggle physically.  Strategies: Share and talk through anxiety, anger, and depression that are experienced and work to find and use helpful coping tools to better manage those feelings.  Diagnosis:   ICD-10-CM   1. Major depressive disorder, recurrent episode, moderate (HCC)  F33.1      Plan: Patient participating in session today and focusing more on her treatment goals but also some med issues that she was able to ensure that she had medication she needed.  Actively participated in session and at times shared her frustration and anger which I think was helpful to her.  She has made some progress and needs to continue working with her goal-directed behaviors that support her emotional health.  Goal review and progress/challenges noted with patient.  Next appointment within 3 to 4 weeks.   Mathis Fare, LCSW

## 2023-08-15 NOTE — Telephone Encounter (Signed)
Attempted to contact patient to reschedule OP MBS. Left VM. RKEEL

## 2023-08-16 ENCOUNTER — Other Ambulatory Visit (HOSPITAL_COMMUNITY): Payer: Self-pay

## 2023-08-16 ENCOUNTER — Other Ambulatory Visit: Payer: Self-pay

## 2023-08-16 DIAGNOSIS — F410 Panic disorder [episodic paroxysmal anxiety] without agoraphobia: Secondary | ICD-10-CM

## 2023-08-16 MED ORDER — ALPRAZOLAM 1 MG PO TABS
ORAL_TABLET | ORAL | 0 refills | Status: DC
Start: 2023-08-16 — End: 2023-09-19

## 2023-08-20 ENCOUNTER — Other Ambulatory Visit (HOSPITAL_COMMUNITY): Payer: Self-pay

## 2023-08-22 ENCOUNTER — Ambulatory Visit (INDEPENDENT_AMBULATORY_CARE_PROVIDER_SITE_OTHER): Payer: BC Managed Care – PPO | Admitting: Psychiatry

## 2023-08-22 ENCOUNTER — Telehealth (HOSPITAL_COMMUNITY): Payer: Self-pay | Admitting: *Deleted

## 2023-08-22 DIAGNOSIS — F331 Major depressive disorder, recurrent, moderate: Secondary | ICD-10-CM | POA: Diagnosis not present

## 2023-08-22 NOTE — Telephone Encounter (Signed)
Attempted to contact patient to reschedule her OP MBS. Left VM. RKEEL

## 2023-08-22 NOTE — Progress Notes (Signed)
[facility] Counselor/Therapist Progress Note  Patient ID: [name], MRN: [id number],    Date: 08/22/2023  Time Spent: 45 minutes   Treatment Type: Individual Therapy  Virtual Visit via Telehealth Note: MyChart video session (some tech issues but we were able to continue our session Connected with patient by a telemedicine/telehealth application, with their informed consent, and verified patient privacy and that I am speaking with the correct person using two identifiers. I discussed the limitations, risks, security and privacy concerns of performing psychotherapy and the availability of in person appointments. I also discussed with the patient that there may be a patient responsible charge related to this service. The patient expressed understanding and agreed to proceed. I discussed the treatment planning with the patient. The patient was provided an opportunity to ask questions and all were answered. The patient agreed with the plan and demonstrated an understanding of the instructions. The patient was advised to call  our office if  symptoms worsen or feel they are in a crisis state and need immediate contact.   Therapist Location: office Patient Location: home  Reported Symptoms: anxiety, depressed, angry, irritable   Mental Status Exam:  Appearance:   Casual     Behavior:  Sharing and Motivated  Motor:  In bed often due to illness; can walk and does get out of home some with family  Speech/Language:   Normal Rate  Affect:  Depressed and some tearfulness  Mood:  anxious, depressed, and sad  Thought process:  goal directed  Thought content:    WNL  Sensory/Perceptual disturbances:    WNL  Orientation:  oriented to person, place, time/date, situation, day of week, month of year, year, and stated date of Nov. 21, 2024  Attention:  Good  Concentration:  Good  Memory:  Fairly good from the session today  Fund of knowledge:   Good  Insight:    Good and Fair  Judgment:    Good  Impulse Control:  Good   Risk Assessment: Danger to Self:  No Self-injurious Behavior: No Danger to Others: No Duty to Warn:no Physical Aggression / Violence:No  Access to Firearms a concern: No  Gang Involvement:No   Subjective: Patient in video telehealth session today reporting anxiety, depression, sadness. Did well in openly expressing her sadness, depression, and anxiety in session today, and processing her concerns for herself and family. Seemed to need session today to unload more of her emotions without interruption, and with positive support. Also discussed some issues related to her 55 yr old son in the home; issues re: patient's cancer issues and her communications with son about her cancer. Seemed to be helpful to patient. Also spoke about a 71 yr old nephew and inappropriate behavior that she has share with boy's parents. Continues to freely vent frustrations, sadness, and fears.  Patient reports she may be entering hospice care at some point in near future.  Does not know this for sure.  Calmer and less angry by end of session and stated it helps her to openly talk about her health and the concerns she has.   Interventions: Cognitive Behavioral Therapy and Ego-Supportive  Long-term goal:  Patient will confront her anxiety, fears, anger reported re: her illness and use time in therapy and beyond to work on this, while receiving feedback and support from therapist. Help her determine what she feels is realistic for her considering her health challenges and difficult prognosis.  Short-term goal: Participate in therapy and initiate communication  of needs and desires at this point in her struggle physically.  Strategies: Share and talk through anxiety, anger, and depression that are experienced and work to find and use helpful coping tools to better manage those feelings.  Diagnosis:   ICD-10-CM   1. Major depressive disorder, recurrent episode, moderate (HCC)  F33.1       Plan: Patient today actively participating in session as she worked further on her depression, sadness, anxiety, some family concerns and issues.  She has made progress and needs to continue working with goal-directed behaviors to feel more supported and positive ways.  Goal review and progress/challenges noted with patient.  Next appointment within 3 to 4 weeks.   Mathis Fare, LCSW

## 2023-08-27 ENCOUNTER — Encounter: Payer: Self-pay | Admitting: Adult Health

## 2023-08-27 ENCOUNTER — Telehealth: Payer: BC Managed Care – PPO | Admitting: Adult Health

## 2023-08-27 DIAGNOSIS — F331 Major depressive disorder, recurrent, moderate: Secondary | ICD-10-CM

## 2023-08-27 DIAGNOSIS — G4700 Insomnia, unspecified: Secondary | ICD-10-CM

## 2023-08-27 DIAGNOSIS — F410 Panic disorder [episodic paroxysmal anxiety] without agoraphobia: Secondary | ICD-10-CM

## 2023-08-27 DIAGNOSIS — F411 Generalized anxiety disorder: Secondary | ICD-10-CM

## 2023-08-27 MED ORDER — OLANZAPINE 5 MG PO TABS
5.0000 mg | ORAL_TABLET | Freq: Every day | ORAL | 2 refills | Status: DC
Start: 2023-08-27 — End: 2023-09-19

## 2023-08-27 NOTE — Progress Notes (Signed)
[name] [id number] 1971-02-20 52 y.o.  Virtual Visit via Video Note  I connected with pt @ on 08/27/23 at  1:20 PM EST by a video enabled telemedicine application and verified that I am speaking with the correct person using two identifiers.   I discussed the limitations of evaluation and management by telemedicine and the availability of in person appointments. The patient expressed understanding and agreed to proceed.  I discussed the assessment and treatment plan with the patient. The patient was provided an opportunity to ask questions and all were answered. The patient agreed with the plan and demonstrated an understanding of the instructions.   The patient was advised to call back or seek an in-person evaluation if the symptoms worsen or if the condition fails to improve as anticipated.  I provided 25 minutes of non-face-to-face time during this encounter.  The patient was located at home.  The provider was located at [facility].   Susan Gibbs, NP   Subjective:   Patient ID:  [name] is a 52 y.o. (DOB 1971/05/19) female.  Chief Complaint: No chief complaint on file.   HPI [name] presents for follow-up of MDD, GAD, insomnia, and panic attacks.  Describes mood today as "not much better". Pleasant. Tearful. Mood symptoms - reports depression, anxiety and irritability. Reports panic attacks. Reports decreased worry, rumination, and over thinking. Mood is lower. Stating "I'm feel like the medication changes were helpful". Reports health continues to decline - recent hospitalizations. Family supportive. Decreased interest and motivation. Taking medications as prescribed. Working with [name] for therapy.  Energy levels low. Active, does not have a regular exercise routine with current physical disabilities. Unable to enjoy usual interests and activities. Married. Lives with husband and son. Has a daughter - 60 in [place]. Spending time with  family.  Appetite adequate. Weight loss 148 pounds. Sleeps better some nights than others. Feels like the addition of Zyprexa has been helpful - willing to increase dose. Focus and concentration difficulties. Completing tasks. Managing aspects of household. Disabled since 2016. Denies SI or HI.  Denies AH or VH. Denies self harm. Denies substance use.   Previous medication trials: Wellbutrin - mean, Zoloft-not helpful   Review of Systems:  Review of Systems  Musculoskeletal:  Negative for gait problem.  Neurological:  Negative for tremors.  Psychiatric/Behavioral:         Please refer to HPI    Medications: I have reviewed the patient's current medications.  Current Outpatient Medications  Medication Sig Dispense Refill   albuterol (PROVENTIL) (2.5 MG/3ML) 0.083% nebulizer solution Take 3 mLs (2.5 mg total) by nebulization every 6 (six) hours as needed for wheezing or shortness of breath. 360 mL 2   ALPRAZolam (XANAX) 1 MG tablet 1 tablet daily and 1 tablet PRN no more than 10 days a month. 40 tablet 0   aspirin EC 81 MG tablet Take 1 tablet (81 mg total) by mouth daily. Swallow whole. (Patient taking differently: Take 81 mg by mouth at bedtime. Chewable) 90 tablet 3   Bismuth Tribromoph-Petrolatum (XEROFORM OCCLUSIVE GAUZE STRIP) PADS Apply 1 each topically as directed. 50 each 1   bisoprolol (ZEBETA) 5 MG tablet Take 2.5 mg by mouth daily.     bumetanide (BUMEX) 1 MG tablet TAKE 1 TABLET BY MOUTH TWICE A DAY (Patient taking differently: Take 1 mg by mouth 2 (two) times daily.) 180 tablet 3   buPROPion (WELLBUTRIN XL) 150 MG 24 hr tablet Take 1  tablet (150 mg total) by mouth daily. 90 tablet 0   clonazePAM (KLONOPIN) 1 MG tablet Take 1 tablet (1 mg total) by mouth 2 (two) times daily. 60 tablet 2   cyclobenzaprine (FLEXERIL) 5 MG tablet Take 1 tablet (5 mg total) by mouth 3 (three) times daily as needed for muscle spasms. 20 tablet 0   diphenhydrAMINE (BENADRYL) 12.5 MG/5ML elixir  Take 10 mLs (25 mg total) by mouth every 6 (six) hours as needed (nausea). 120 mL 0   doxycycline (VIBRA-TABS) 100 MG tablet Take 1 tablet (100 mg total) by mouth 2 (two) times daily. 14 tablet 0   EMGALITY 120 MG/ML SOSY Inject 120 mg into the skin every 28 (twenty-eight) days.     EPINEPHRINE 0.3 mg/0.3 mL IJ SOAJ injection INJECT 0.3 MG INTO THE MUSCLE AS NEEDED FOR ANAPHYLAXIS. 2 each 1   escitalopram (LEXAPRO) 20 MG tablet TAKE 1 TABLET BY MOUTH EVERY DAY IN THE EVENING (Patient taking differently: Take 20 mg by mouth daily in the afternoon. Afternoon) 90 tablet 1   famotidine (PEPCID) 40 MG/5ML suspension Take 2.5 mLs (20 mg total) by mouth daily. 75 mL 0   famotidine (PEPCID) 40 MG/5ML suspension Take 1.3 mLs (10.4 mg total) by mouth daily. 50 mL 0   FLORASTOR 250 MG capsule Take 250 mg by mouth 2 (two) times daily. Mid Morning and Mid Afternoon     fluticasone-salmeterol (ADVAIR HFA) 230-21 MCG/ACT inhaler INHALE 2 PUFFS INTO THE LUNGS TWICE A DAY 12 each 5   Heparin Na, Pork, Lock Flsh PF (BD HEPARIN POSIFLUSH) 100 UNIT/ML SOLN USE 5 MLS IN PORT A CATH ONCE DAILY AFTER MEDICATION ADMINISTRATION AS A HEPLOCK AS DIRECTED. 150 mL 3   hydrocortisone (CORTEF) 10 MG tablet Take 1-2 tablets (10-20 mg total) by mouth See admin instructions. Take 20 mg in the am and 10mg  in the evening. Take after completing prednisone (Patient taking differently: Take 10-20 mg by mouth See admin instructions. Take 20 mg in the am at 10 am and 10 mg in the afternoon. Take after completing prednisone)     hydrOXYzine (ATARAX) 25 MG tablet Take 1 tablet (25 mg total) by mouth every 6 (six) hours as needed for anxiety. 30 tablet 0   lamoTRIgine (LAMICTAL) 200 MG tablet TAKE 1 TABLET BY MOUTH EVERYDAY AT BEDTIME 90 tablet 0   lamoTRIgine (LAMICTAL) 25 MG tablet Take two tablets every morning. 180 tablet 0   Lancets (ONETOUCH DELICA PLUS LANCET33G) MISC 3 (three) times daily. for testing     lansoprazole (PREVACID) 15 MG  capsule Take 30 mg by mouth daily at 12 noon.     LORazepam (ATIVAN) 0.5 MG tablet Take 1 tablet (0.5 mg total) by mouth 3 (three) times daily as needed for anxiety. (Patient taking differently: Place 0.5 mg into feeding tube 3 (three) times daily as needed for anxiety. Access Port For Nausea) 12 tablet 0   nitroGLYCERIN (NITROSTAT) 0.4 MG SL tablet DISSOLVE 1 TAB UNDER THE TONGUE EVERY 5 MINUTES AS NEEDED FOR CHEST PAIN. MAX OF 3 DOSES, THEN 911. 75 tablet 0   OLANZapine (ZYPREXA) 5 MG tablet Take 1 tablet (5 mg total) by mouth at bedtime. 30 tablet 2   ONETOUCH VERIO test strip 3 (three) times daily. for testing     Oxycodone HCl 10 MG TABS Take 1 tablet by mouth every 6 (six) hours as needed (pain).     OXYGEN Inhale 4-5 L/min into the lungs continuous.  PROAIR HFA 108 (90 Base) MCG/ACT inhaler Inhale 2 puffs into the lungs every 4 (four) hours as needed for wheezing or shortness of breath.     QVAR REDIHALER 80 MCG/ACT inhaler Inhale 1 puff into the lungs 2 (two) times daily.     RABEprazole (ACIPHEX) 20 MG tablet Take 1 tablet (20 mg total) by mouth daily. 90 tablet 3   rosuvastatin (CRESTOR) 10 MG tablet Take 10 mg by mouth in the morning. Mid morning     sacubitril-valsartan (ENTRESTO) 24-26 MG TAKE 1 TABLET BY MOUTH TWICE A DAY 60 tablet 5   silver sulfADIAZINE (SILVADENE) 1 % cream Apply to affected area daily (Patient taking differently: Apply 1 Application topically daily as needed.) 50 g 1   Sodium Chloride Flush (NORMAL SALINE FLUSH) 0.9 % SOLN Use as directed for central venous catheter maintenance 600 mL 3   Sodium Chloride Flush (NORMAL SALINE FLUSH) 0.9 % SOLN Use as directed for central venous catheter maintenance 600 mL 3   sucralfate (CARAFATE) 1 GM/10ML suspension Take 1 g by mouth daily as needed (as directed for ulcers).     SYNTHROID 100 MCG tablet Take 1 tablet (100 mcg total) by mouth every morning. (Patient taking differently: Take 100 mcg by mouth every morning. * Must  be name brand*)     Tiotropium Bromide Monohydrate (SPIRIVA RESPIMAT) 1.25 MCG/ACT AERS Inhale 2 puffs into the lungs daily. 4 g 5   topiramate (TOPAMAX) 50 MG tablet Take 150 mg by mouth at bedtime.      zolpidem (AMBIEN CR) 12.5 MG CR tablet TAKE ONE TABLET BY MOUTH AT BEDTIME AS NEEDED FOR SLEEP. 90 tablet 0   No current facility-administered medications for this visit.    Medication Side Effects: None  Allergies:  Allergies  Allergen Reactions   Ferrous Bisglycinate Chelate [Iron] Anaphylaxis and Other (See Comments)    Only IV - only FERRLICET   Mushroom Extract Complex Anaphylaxis   Na Ferric Gluc Cplx In Sucrose Anaphylaxis   Cymbalta [Duloxetine Hcl] Swelling and Anxiety   Hydromorphone Other (See Comments)    BP drop and heart rate drops 5.1.20 PT REPORTS THAT SHE TAKES DILAUDID AT HOME   Ondansetron Hcl Other (See Comments)   Promethazine Other (See Comments)    Other reaction(s): Unknown   Succinylcholine Other (See Comments)    Lock Jaw   Buprenorphine Hcl Hives   Compazine Other (See Comments)    Altered mental status Aggression   Duloxetine     Other reaction(s): Not available   Metoclopramide Other (See Comments)    Dystonia   Morphine And Codeine Hives   Ondansetron Hives and Rash    Other reaction(s): Unknown Other reaction(s): Unknown   Promethazine Hcl Hives   Tegaderm Ag Mesh [Silver] Rash    Old formulation only, is able to tolerate new formulation    Past Medical History:  Diagnosis Date   Addison's disease (HCC)    Adrenal insufficiency (HCC)    Anemia    Anxiety    Aortic stenosis    Aortic stenosis    Appendicitis 12/19/09   Appendicitis    Breast cancer (HCC)    STATUS POST BILATERAL MASTECTOMY. STATUS POST RECONSTRUCTION. SHE HAD SILICONE BREAST IMPLANTS AND THE LEFT IMPLANT IS LEAKING SLIGHTLY   Cellulitis of right middle finger 11/07/2018   Cervical cancer (HCC) 12/23/2018   Chest pain    CHF with right heart failure (HCC) 04/17/2017    Chronic respiratory failure with hypoxia (HCC)  12/23/2018   Cough variant asthma 04/13/2019   Depression    GERD (gastroesophageal reflux disease)    takes Dexilant and carafate and gi coctail    Headache    migraines on a daily and monthly regimen    Heart murmur    History of kidney stones    Hodgkin lymphoma (HCC)    STATUS POST MANTLE RADIATION   Hodgkin's lymphoma (HCC)    1987   Hypertension    Hypoxia    Necrotizing fasciitis (HCC) 12/23/2018   Non-ischemic cardiomyopathy (HCC)    Osteoporosis    Palpitations    Pituitary adenoma (HCC) 12/23/2018   Pneumonia    PONV (postoperative nausea and vomiting)    Pre-diabetes    per pt; no meds   Pulmonary hypertension (HCC) 12/23/2018   Raynaud phenomenon    Right heart failure (HCC) 04/17/2017   Seizures (HCC)    last febrile seizure was approx 3 weeks ago per report on 12/01/2020   Sleep apnea    upcoming sleep study per pt    Supplemental oxygen dependent    3 liters   SVT (supraventricular tachycardia) (HCC)    Tachycardia    Thyroid cancer (HCC)    STATUS POST SURGICAL REMOVAL-CURRENT ON THYROID REPLACEMENT    Family History  Family history unknown: Yes    Social History   Socioeconomic History   Marital status: Married    Spouse name: Not on file   Number of children: Not on file   Years of education: Not on file   Highest education level: Not on file  Occupational History   Not on file  Tobacco Use   Smoking status: Never   Smokeless tobacco: Never  Vaping Use   Vaping status: Never Used  Substance and Sexual Activity   Alcohol use: Not Currently    Comment: social    Drug use: No   Sexual activity: Not Currently    Birth control/protection: Surgical  Other Topics Concern   Not on file  Social History Narrative   Not on file   Social Determinants of Health   Financial Resource Strain: Medium Risk (08/21/2017)   Received from [facility], [facility]    Overall Financial Resource Strain (CARDIA)    Difficulty of Paying Living Expenses: Somewhat hard  Food Insecurity: No Food Insecurity (04/23/2023)   Hunger Vital Sign    Worried About Running Out of Food in the Last Year: Never true    Ran Out of Food in the Last Year: Never true  Transportation Needs: No Transportation Needs (04/23/2023)   PRAPARE - Administrator, Civil Service (Medical): No    Lack of Transportation (Non-Medical): No  Physical Activity: Unknown (08/21/2017)   Received from [facility], [facility]   Exercise Vital Sign    Days of Exercise per Week: 0 days    Minutes of Exercise per Session: Not on file  Stress: Stress Concern Present (08/21/2017)   Received from [facility], [facility]   [employer] of Occupational Health - Occupational Stress Questionnaire    Feeling of Stress : Very much  Social Connections: Unknown (02/12/2022)   Received from [facility], [facility]   Social Network    Social Network: Not on file  Intimate Partner Violence: Not At Risk (04/23/2023)   Humiliation, Afraid, Rape, and Kick questionnaire    Fear of Current or Ex-Partner: No  Emotionally Abused: No    Physically Abused: No    Sexually Abused: No    Past Medical History, Surgical history, Social history, and Family history were reviewed and updated as appropriate.   Please see review of systems for further details on the patient's review from today.   Objective:   Physical Exam:  LMP  (LMP Unknown)   Physical Exam Constitutional:      General: She is not in acute distress. Musculoskeletal:        General: No deformity.  Neurological:     Mental Status: She is alert and oriented to person, place, and time.     Coordination: Coordination normal.  Psychiatric:        Attention and Perception: Attention and perception normal. She does not perceive auditory or visual  hallucinations.        Mood and Affect: Affect is not labile, blunt, angry or inappropriate.        Speech: Speech normal.        Behavior: Behavior normal.        Thought Content: Thought content normal. Thought content is not paranoid or delusional. Thought content does not include homicidal or suicidal ideation. Thought content does not include homicidal or suicidal plan.        Cognition and Memory: Cognition and memory normal.        Judgment: Judgment normal.     Comments: Insight intact     Lab Review:     Component Value Date/Time   NA 140 07/26/2023 1433   NA 140 08/20/2022 1513   K 3.2 (L) 07/26/2023 1433   CL 103 07/26/2023 1433   CO2 24 07/26/2023 1433   GLUCOSE 169 (H) 07/26/2023 1433   BUN 30 (H) 07/26/2023 1433   BUN 28 (H) 08/20/2022 1513   CREATININE 1.25 (H) 07/26/2023 1433   CREATININE 1.08 (H) 02/09/2022 1202   CALCIUM 9.1 07/26/2023 1433   PROT 7.5 07/26/2023 1433   PROT 7.1 01/23/2018 1225   ALBUMIN 3.9 07/26/2023 1433   ALBUMIN 4.4 01/23/2018 1225   AST 20 07/26/2023 1433   AST 26 02/09/2022 1202   ALT 30 07/26/2023 1433   ALT 40 02/09/2022 1202   ALKPHOS 56 07/26/2023 1433   BILITOT 0.6 07/26/2023 1433   BILITOT 0.4 02/09/2022 1202   GFRNONAA 52 (L) 07/26/2023 1433   GFRNONAA >60 02/09/2022 1202   GFRAA >60 04/27/2020 0613   GFRAA >60 02/10/2020 1020       Component Value Date/Time   WBC 11.4 (H) 07/26/2023 1433   RBC 4.03 07/26/2023 1433   HGB 10.0 (L) 07/26/2023 1433   HGB 10.6 (L) 08/20/2022 1513   HCT 32.7 (L) 07/26/2023 1433   HCT 34.4 08/20/2022 1513   PLT 314 07/26/2023 1433   PLT 414 08/20/2022 1513   MCV 81.1 07/26/2023 1433   MCV 78 (L) 08/20/2022 1513   MCH 24.8 (L) 07/26/2023 1433   MCHC 30.6 07/26/2023 1433   RDW 18.4 (H) 07/26/2023 1433   RDW 16.5 (H) 08/20/2022 1513   LYMPHSABS 1.5 07/09/2023 2013   MONOABS 0.8 07/09/2023 2013   EOSABS 0.1 07/09/2023 2013   BASOSABS 0.1 07/09/2023 2013    No results found for:  "POCLITH", "LITHIUM"   No results found for: "PHENYTOIN", "PHENOBARB", "VALPROATE", "CBMZ"   .res Assessment: Plan:   Plan:  PDMP reviewed  Clonazepam 1mg  - twice daily. Xanax 1mg  daily and one extra as needed for extreme anxiety up to10 days a month.  Lexapro 20mg  daily Wellbutrin XL 150mg  every morning - did not tolerate increase in dose - reports seizure history Lamictal 200mg  daily Lamictal 50mg  every morning  Increase Olanzapine 2.5mg  to 5mg  at hs   Seeing [name] therapy visits.  Discussed potential benefits, risk, and side effects of benzodiazepines to include potential risk of tolerance and dependence, as well as possible drowsiness.  Advised patient not to drive if experiencing drowsiness and to take lowest possible effective dose to minimize risk of dependence and tolerance.   Counseled patient regarding potential benefits, risks, and side effects of Lamictal to include potential risk of Stevens-Johnson syndrome. Advised patient to stop taking Lamictal and contact office immediately if rash develops and to seek urgent medical attention if rash is severe and/or spreading quickly.  Discussed potential metabolic side effects associated with atypical antipsychotics, as well as potential risk for movement side effects. Advised pt to contact office if movement side effects occur.    RTC 3 to 4 weeks  Patient advised to contact office with any questions, adverse effects, or acute worsening in signs and symptoms.  Diagnoses and all orders for this visit:  Major depressive disorder, recurrent episode, moderate (HCC) -     OLANZapine (ZYPREXA) 5 MG tablet; Take 1 tablet (5 mg total) by mouth at bedtime.  Generalized anxiety disorder -     OLANZapine (ZYPREXA) 5 MG tablet; Take 1 tablet (5 mg total) by mouth at bedtime.  Panic attacks  Insomnia, unspecified type     Please see After Visit Summary for patient specific instructions.  Future Appointments  Date Time  Provider Department Center  09/10/2023 11:45 AM [address] [facility] None  09/11/2023  4:00 PM Luciano Cutter, MD [facility]  09/16/2023  1:00 PM [facility] SPEECH THERAPIST [facility] [place]  09/16/2023  1:00 PM [facility] R/F 1 [facility] [place]  09/20/2023  9:00 AM Mathis Fare, LCSW [facility] None  10/17/2023 10:00 AM Mathis Fare, LCSW [facility] None    No orders of the defined types were placed in this encounter.     -------------------------------

## 2023-08-28 ENCOUNTER — Other Ambulatory Visit (INDEPENDENT_AMBULATORY_CARE_PROVIDER_SITE_OTHER): Payer: Self-pay | Admitting: Otolaryngology

## 2023-09-03 ENCOUNTER — Encounter (HOSPITAL_BASED_OUTPATIENT_CLINIC_OR_DEPARTMENT_OTHER): Payer: Self-pay | Admitting: *Deleted

## 2023-09-03 ENCOUNTER — Emergency Department (HOSPITAL_BASED_OUTPATIENT_CLINIC_OR_DEPARTMENT_OTHER)
Admission: EM | Admit: 2023-09-03 | Discharge: 2023-09-03 | Disposition: A | Discharge status: Home / Self Care | Payer: BC Managed Care – PPO | Attending: Emergency Medicine | Admitting: Emergency Medicine

## 2023-09-03 ENCOUNTER — Other Ambulatory Visit: Payer: Self-pay

## 2023-09-03 ENCOUNTER — Encounter (HOSPITAL_BASED_OUTPATIENT_CLINIC_OR_DEPARTMENT_OTHER): Payer: Self-pay

## 2023-09-03 ENCOUNTER — Emergency Department (HOSPITAL_BASED_OUTPATIENT_CLINIC_OR_DEPARTMENT_OTHER): Payer: BC Managed Care – PPO | Admitting: Radiology

## 2023-09-03 ENCOUNTER — Telehealth: Payer: Self-pay | Admitting: Pulmonary Disease

## 2023-09-03 ENCOUNTER — Emergency Department (HOSPITAL_BASED_OUTPATIENT_CLINIC_OR_DEPARTMENT_OTHER): Payer: BC Managed Care – PPO

## 2023-09-03 DIAGNOSIS — Z79899 Other long term (current) drug therapy: Secondary | ICD-10-CM | POA: Diagnosis not present

## 2023-09-03 DIAGNOSIS — R918 Other nonspecific abnormal finding of lung field: Secondary | ICD-10-CM | POA: Diagnosis not present

## 2023-09-03 DIAGNOSIS — Z7982 Long term (current) use of aspirin: Secondary | ICD-10-CM | POA: Insufficient documentation

## 2023-09-03 DIAGNOSIS — I10 Essential (primary) hypertension: Secondary | ICD-10-CM | POA: Diagnosis not present

## 2023-09-03 DIAGNOSIS — I509 Heart failure, unspecified: Secondary | ICD-10-CM | POA: Diagnosis not present

## 2023-09-03 DIAGNOSIS — R011 Cardiac murmur, unspecified: Secondary | ICD-10-CM | POA: Diagnosis not present

## 2023-09-03 DIAGNOSIS — R0602 Shortness of breath: Secondary | ICD-10-CM | POA: Insufficient documentation

## 2023-09-03 DIAGNOSIS — G8222 Paraplegia, incomplete: Secondary | ICD-10-CM | POA: Diagnosis not present

## 2023-09-03 DIAGNOSIS — R911 Solitary pulmonary nodule: Secondary | ICD-10-CM | POA: Diagnosis not present

## 2023-09-03 DIAGNOSIS — Z452 Encounter for adjustment and management of vascular access device: Secondary | ICD-10-CM | POA: Diagnosis not present

## 2023-09-03 DIAGNOSIS — M79604 Pain in right leg: Secondary | ICD-10-CM | POA: Insufficient documentation

## 2023-09-03 DIAGNOSIS — Z86718 Personal history of other venous thrombosis and embolism: Secondary | ICD-10-CM | POA: Diagnosis not present

## 2023-09-03 LAB — CBC WITH DIFFERENTIAL/PLATELET
Abs Immature Granulocytes: 0.04 10*3/uL (ref 0.00–0.07)
Basophils Absolute: 0.1 10*3/uL (ref 0.0–0.1)
Basophils Relative: 1 %
Eosinophils Absolute: 0.1 10*3/uL (ref 0.0–0.5)
Eosinophils Relative: 1 %
HCT: 31.1 % — ABNORMAL LOW (ref 36.0–46.0)
Hemoglobin: 9.6 g/dL — ABNORMAL LOW (ref 12.0–15.0)
Immature Granulocytes: 0 %
Lymphocytes Relative: 16 %
Lymphs Abs: 2.2 10*3/uL (ref 0.7–4.0)
MCH: 25 pg — ABNORMAL LOW (ref 26.0–34.0)
MCHC: 30.9 g/dL (ref 30.0–36.0)
MCV: 81 fL (ref 80.0–100.0)
Monocytes Absolute: 1 10*3/uL (ref 0.1–1.0)
Monocytes Relative: 7 %
Neutro Abs: 10.2 10*3/uL — ABNORMAL HIGH (ref 1.7–7.7)
Neutrophils Relative %: 75 %
Platelets: 332 10*3/uL (ref 150–400)
RBC: 3.84 MIL/uL — ABNORMAL LOW (ref 3.87–5.11)
RDW: 18.4 % — ABNORMAL HIGH (ref 11.5–15.5)
WBC: 13.6 10*3/uL — ABNORMAL HIGH (ref 4.0–10.5)
nRBC: 0 % (ref 0.0–0.2)

## 2023-09-03 LAB — COMPREHENSIVE METABOLIC PANEL
ALT: 25 U/L (ref 0–44)
AST: 18 U/L (ref 15–41)
Albumin: 3.8 g/dL (ref 3.5–5.0)
Alkaline Phosphatase: 56 U/L (ref 38–126)
Anion gap: 10 (ref 5–15)
BUN: 28 mg/dL — ABNORMAL HIGH (ref 6–20)
CO2: 26 mmol/L (ref 22–32)
Calcium: 8.4 mg/dL — ABNORMAL LOW (ref 8.9–10.3)
Chloride: 105 mmol/L (ref 98–111)
Creatinine, Ser: 1.45 mg/dL — ABNORMAL HIGH (ref 0.44–1.00)
GFR, Estimated: 43 mL/min — ABNORMAL LOW (ref >60)
Glucose, Bld: 170 mg/dL — ABNORMAL HIGH (ref 70–99)
Potassium: 3.2 mmol/L — ABNORMAL LOW (ref 3.5–5.1)
Sodium: 141 mmol/L (ref 135–145)
Total Bilirubin: 0.3 mg/dL (ref <1.2)
Total Protein: 6.8 g/dL (ref 6.5–8.1)

## 2023-09-03 LAB — D-DIMER, QUANTITATIVE: D-Dimer, Quant: 0.48 ug{FEU}/mL (ref 0.00–0.50)

## 2023-09-03 LAB — BRAIN NATRIURETIC PEPTIDE: B Natriuretic Peptide: 43.3 pg/mL (ref 0.0–100.0)

## 2023-09-03 MED ORDER — METHOCARBAMOL 500 MG PO TABS: 500.0000 mg | ORAL_TABLET | Freq: Two times a day (BID) | ORAL | 0 refills | Status: DC

## 2023-09-03 MED ORDER — ALBUTEROL SULFATE HFA 108 (90 BASE) MCG/ACT IN AERS
2.0000 | INHALATION_SPRAY | RESPIRATORY_TRACT | Status: DC | PRN
Start: 2023-09-03 — End: 2023-09-03

## 2023-09-03 NOTE — Telephone Encounter (Signed)
Please let husband know that she will need to certify with oxygen when she comes to clinic.

## 2023-09-03 NOTE — ED Triage Notes (Signed)
Pt with hx of CHF and dvt with home of o2 5L is here for increasing sob and concern for new DVT in right upper leg.  Pt is alert and oriented.

## 2023-09-03 NOTE — Telephone Encounter (Signed)
Richard husband states patient needs order for portable oxygen concentrator. Patient uses [facility]. Richard phone number is [phone].

## 2023-09-03 NOTE — ED Provider Notes (Signed)
[place] [facility]MENT AT [facility] Provider Note   CSN: [id number] Arrival date & time: 09/03/23  1156     History  Chief Complaint  Patient presents with   Shortness of Breath    [name] is a 52 y.o. female.  With a significant past medical history including but not limited to hypertension, right heart failure, pulmonary hypertension, nonischemic cardiomyopathy, Hodgkin's lymphoma, anxiety, depression, panic disorder, aortic stenosis, chronic respiratory failure with hypoxia presenting to the emergency department for evaluation of right lower extremity pain.  Pain began sometime this morning.  It is localized to the medial aspect of the right upper leg.  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  States she is not anticoagulated due to her medication regimen and potential side effects.  She states she is mostly sedentary due to her chronic conditions which puts her at increased risk for blood clots.  She reports slight worsening of her baseline shortness of breath.  No recent change in her chronic cough.  No hemoptysis.  No chest pain.  No fevers or chills.  No recent increase in oxygen demand from her baseline of 5 L nasal cannula.  She states she has bilateral lower extremity swelling chronically, no recent worsening of this. she is currently on palliative care.   Shortness of Breath      Home Medications Prior to Admission medications   Medication Sig Start Date End Date Taking? Authorizing Provider  diclofenac (VOLTAREN) 50 MG EC tablet Take 50 mg by mouth 2 (two) times daily. *as needed for migraine, Can repeat if needed* 07/23/23  Yes [provider]  JANUVIA 100 MG tablet Take 100 mg by mouth daily. 08/22/23  Yes [provider]  methocarbamol (ROBAXIN) 500 MG tablet Take 1 tablet (500 mg total) by mouth 2 (two) times daily. 09/03/23  Yes Qaadir Kent, Edsel Petrin, PA-C  NURTEC 75 MG TBDP Take 1 tablet by mouth daily as  needed. 09/03/23  Yes [provider]  albuterol (PROVENTIL) (2.5 MG/3ML) 0.083% nebulizer solution Take 3 mLs (2.5 mg total) by nebulization every 6 (six) hours as needed for wheezing or shortness of breath. 05/16/23 08/14/23  Luciano Cutter, MD  ALPRAZolam Prudy Feeler) 1 MG tablet 1 tablet daily and 1 tablet PRN no more than 10 days a month. 08/16/23   Mozingo, Thereasa Solo, NP  aspirin EC 81 MG tablet Take 1 tablet (81 mg total) by mouth daily. Swallow whole. Patient taking differently: Take 81 mg by mouth at bedtime. Chewable 07/16/22   Nahser, Deloris Ping, MD  Bismuth Tribromoph-Petrolatum (XEROFORM OCCLUSIVE GAUZE STRIP) PADS Apply 1 each topically as directed. 09/17/22   Rolly Salter, MD  bisoprolol (ZEBETA) 5 MG tablet Take 2.5 mg by mouth daily. 05/12/23   [provider]  bumetanide (BUMEX) 1 MG tablet TAKE 1 TABLET BY MOUTH TWICE A DAY Patient taking differently: Take 1 mg by mouth 2 (two) times daily. 11/20/22   Nahser, Deloris Ping, MD  buPROPion (WELLBUTRIN XL) 150 MG 24 hr tablet Take 1 tablet (150 mg total) by mouth daily. 08/01/23   Mozingo, Thereasa Solo, NP  clonazePAM (KLONOPIN) 1 MG tablet Take 1 tablet (1 mg total) by mouth 2 (two) times daily. 08/01/23   Mozingo, Thereasa Solo, NP  cyclobenzaprine (FLEXERIL) 5 MG tablet Take 1 tablet (5 mg total) by mouth 3 (three) times daily as needed for muscle spasms. 06/20/23   Leath-Warren, Sadie Haber, NP  diphenhydrAMINE (BENADRYL)  12.5 MG/5ML elixir Take 10 mLs (25 mg total) by mouth every 6 (six) hours as needed (nausea). 09/17/22   Rolly Salter, MD  doxycycline (VIBRA-TABS) 100 MG tablet Take 1 tablet (100 mg total) by mouth 2 (two) times daily. 07/26/23   Linwood Dibbles, MD  EMGALITY 120 MG/ML SOSY Inject 120 mg into the skin every 28 (twenty-eight) days.    [provider]  EPINEPHRINE 0.3 mg/0.3 mL IJ SOAJ injection INJECT 0.3 MG INTO THE MUSCLE AS NEEDED FOR ANAPHYLAXIS. 04/25/23   Luciano Cutter, MD   escitalopram (LEXAPRO) 20 MG tablet TAKE 1 TABLET BY MOUTH EVERY DAY IN THE EVENING Patient taking differently: Take 20 mg by mouth daily in the afternoon. Afternoon 03/08/23   Mozingo, Thereasa Solo, NP  famotidine (PEPCID) 40 MG/5ML suspension Take 2.5 mLs (20 mg total) by mouth daily. 03/30/23 05/16/23  Curatolo, Adam, DO  famotidine (PEPCID) 40 MG/5ML suspension Take 1.3 mLs (10.4 mg total) by mouth daily. 07/18/23   Smitty Knudsen, PA-C  FLORASTOR 250 MG capsule Take 250 mg by mouth 2 (two) times daily. Mid Morning and Mid Afternoon    [provider]  fluticasone-salmeterol (ADVAIR HFA) 230-21 MCG/ACT inhaler INHALE 2 PUFFS INTO THE LUNGS TWICE A DAY 04/16/23   Luciano Cutter, MD  Heparin Na, Pork, Lock Flsh PF (BD HEPARIN POSIFLUSH) 100 UNIT/ML SOLN USE 5 MLS IN PORT A CATH ONCE DAILY AFTER MEDICATION ADMINISTRATION AS A HEPLOCK AS DIRECTED. 06/10/23     hydrocortisone (CORTEF) 10 MG tablet Take 1-2 tablets (10-20 mg total) by mouth See admin instructions. Take 20 mg in the am and 10mg  in the evening. Take after completing prednisone Patient taking differently: Take 10-20 mg by mouth See admin instructions. Take 20 mg in the am at 10 am and 10 mg in the afternoon. Take after completing prednisone 09/29/22   Rolly Salter, MD  hydrOXYzine (ATARAX) 25 MG tablet Take 1 tablet (25 mg total) by mouth every 6 (six) hours as needed for anxiety. 01/24/23   Azucena Fallen, MD  lamoTRIgine (LAMICTAL) 200 MG tablet TAKE 1 TABLET BY MOUTH EVERYDAY AT BEDTIME 08/01/23   Mozingo, Thereasa Solo, NP  lamoTRIgine (LAMICTAL) 25 MG tablet Take two tablets every morning. 08/01/23   Mozingo, Thereasa Solo, NP  Lancets (ONETOUCH DELICA PLUS [place]) MISC 3 (three) times daily. for testing 06/12/21   [provider]  lansoprazole (PREVACID) 15 MG capsule Take 30 mg by mouth daily at 12 noon.    [provider]  LORazepam (ATIVAN) 0.5 MG tablet Take 1 tablet (0.5 mg total) by  mouth 3 (three) times daily as needed for anxiety. Patient taking differently: Place 0.5 mg into feeding tube 3 (three) times daily as needed for anxiety. [employer] For Nausea 02/11/23   Mozingo, Thereasa Solo, NP  nitroGLYCERIN (NITROSTAT) 0.4 MG SL tablet DISSOLVE 1 TAB UNDER THE TONGUE EVERY 5 MINUTES AS NEEDED FOR CHEST PAIN. MAX OF 3 DOSES, THEN 911. 07/03/23   Nahser, Deloris Ping, MD  OLANZapine (ZYPREXA) 5 MG tablet Take 1 tablet (5 mg total) by mouth at bedtime. 08/27/23   Mozingo, Thereasa Solo, NP  [facility] VERIO test strip 3 (three) times daily. for testing 06/12/21   [provider]  Oxycodone HCl 10 MG TABS Take 1 tablet by mouth every 6 (six) hours as needed (pain).    [provider]  OXYGEN Inhale 4-5 L/min into the lungs continuous.    [provider]  [facility]  HFA 108 (90 Base) MCG/ACT inhaler Inhale 2 puffs into the lungs every 4 (four) hours as needed for wheezing or shortness of breath.    [provider]  QVAR REDIHALER 80 MCG/ACT inhaler Inhale 1 puff into the lungs 2 (two) times daily. 05/10/23   [provider]  RABEprazole (ACIPHEX) 20 MG tablet Take 1 tablet (20 mg total) by mouth daily. 09/02/23   Read Drivers, MD  rosuvastatin (CRESTOR) 10 MG tablet Take 10 mg by mouth in the morning. Mid morning 02/28/18   [provider]  sacubitril-valsartan (ENTRESTO) 24-26 MG TAKE 1 TABLET BY MOUTH TWICE A DAY 07/03/23   Nahser, Deloris Ping, MD  silver sulfADIAZINE (SILVADENE) 1 % cream Apply to affected area daily Patient taking differently: Apply 1 Application topically daily as needed. 09/17/22 09/17/23  Rolly Salter, MD  Sodium Chloride Flush (NORMAL SALINE FLUSH) 0.9 % SOLN Use as directed for central venous catheter maintenance 01/31/23     Sodium Chloride Flush (NORMAL SALINE FLUSH) 0.9 % SOLN Use as directed for central venous catheter maintenance 07/26/23     sucralfate (CARAFATE) 1 GM/10ML suspension Take 1 g by mouth daily as  needed (as directed for ulcers).    [provider]  SYNTHROID 100 MCG tablet Take 1 tablet (100 mcg total) by mouth every morning. Patient taking differently: Take 100 mcg by mouth every morning. * Must be name brand* 10/19/21   Serena Croissant, MD  Tiotropium Bromide Monohydrate (SPIRIVA RESPIMAT) 1.25 MCG/ACT AERS Inhale 2 puffs into the lungs daily. 05/16/23   Luciano Cutter, MD  topiramate (TOPAMAX) 50 MG tablet Take 150 mg by mouth at bedtime.  12/25/19   [provider]  zolpidem (AMBIEN CR) 12.5 MG CR tablet TAKE ONE TABLET BY MOUTH AT BEDTIME AS NEEDED FOR SLEEP. 05/14/23         Allergies    Ferrous bisglycinate chelate [iron], Mushroom extract complex, Na ferric gluc cplx in sucrose, Cymbalta [duloxetine hcl], Hydromorphone, Ondansetron hcl, Promethazine, Succinylcholine, Buprenorphine hcl, Compazine, Duloxetine, Metoclopramide, Morphine and codeine, Ondansetron, Promethazine hcl, and Tegaderm ag mesh [silver]    Review of Systems   Review of Systems  Respiratory:  Positive for shortness of breath.   Musculoskeletal:  Positive for myalgias.  All other systems reviewed and are negative.   Physical Exam Updated Vital Signs BP 111/79 (BP Location: Right Arm)   Pulse 96   Temp 97.8 F (36.6 C)   Resp 20   LMP  (LMP Unknown)   SpO2 100%  Physical Exam Vitals and nursing note reviewed.  Constitutional:      General: She is not in acute distress.    Appearance: She is well-developed.     Comments: Resting comfortably in bed  HENT:     Head: Normocephalic and atraumatic.  Eyes:     Conjunctiva/sclera: Conjunctivae normal.  Cardiovascular:     Rate and Rhythm: Normal rate and regular rhythm.     Heart sounds: Murmur heard.  Pulmonary:     Effort: Pulmonary effort is normal. No respiratory distress.     Breath sounds: Normal breath sounds. No decreased breath sounds, wheezing, rhonchi or rales.  Abdominal:     Palpations: Abdomen is soft.     Tenderness:  There is no abdominal tenderness.  Musculoskeletal:        General: No swelling.     Cervical back: Neck supple.     Comments: Mild bilateral lower extremity swelling, no pitting edema.  No  overlying erythema bilaterally.  Skin:    General: Skin is warm and dry.     Capillary Refill: Capillary refill takes less than 2 seconds.  Neurological:     Mental Status: She is alert.  Psychiatric:        Mood and Affect: Mood normal.        Behavior: Behavior normal.     ED Results / Procedures / Treatments   Labs (all labs ordered are listed, but only abnormal results are displayed) Labs Reviewed  CBC WITH DIFFERENTIAL/PLATELET - Abnormal; Notable for the following components:      Result Value   WBC 13.6 (*)    RBC 3.84 (*)    Hemoglobin 9.6 (*)    HCT 31.1 (*)    MCH 25.0 (*)    RDW 18.4 (*)    Neutro Abs 10.2 (*)    All other components within normal limits  COMPREHENSIVE METABOLIC PANEL - Abnormal; Notable for the following components:   Potassium 3.2 (*)    Glucose, Bld 170 (*)    BUN 28 (*)    Creatinine, Ser 1.45 (*)    Calcium 8.4 (*)    GFR, Estimated 43 (*)    All other components within normal limits  BRAIN NATRIURETIC PEPTIDE  D-DIMER, QUANTITATIVE    EKG EKG Interpretation Date/Time:  Tuesday September 03 2023 12:14:01 EST Ventricular Rate:  101 PR Interval:  138 QRS Duration:  82 QT Interval:  360 QTC Calculation: 466 R Axis:   -46  Text Interpretation: Sinus tachycardia Left anterior fascicular block Minimal voltage criteria for LVH, may be normal variant ( R in aVL ) Abnormal ECG When compared with ECG of 26-Jul-2023 15:39, PREVIOUS ECG IS PRESENT No significant change since last tracing Confirmed by Jacalyn Lefevre [phone]) on 09/03/2023 1:16:21 PM  Radiology US Venous Img Lower Unilateral Right (DVT)  Result Date: 09/03/2023 CLINICAL DATA:  Right lower extremity pain and history of prior DVT. EXAM: RIGHT LOWER EXTREMITY VENOUS DOPPLER ULTRASOUND  TECHNIQUE: Gray-scale sonography with graded compression, as well as color Doppler and duplex ultrasound were performed to evaluate the lower extremity deep venous systems from the level of the common femoral vein and including the common femoral, femoral, profunda femoral, popliteal and calf veins including the posterior tibial, peroneal and gastrocnemius veins when visible. The superficial great saphenous vein was also interrogated. Spectral Doppler was utilized to evaluate flow at rest and with distal augmentation maneuvers in the common femoral, femoral and popliteal veins. COMPARISON:  None Available. FINDINGS: Contralateral Common Femoral Vein: Respiratory phasicity is normal and symmetric with the symptomatic side. No evidence of thrombus. Normal compressibility. Common Femoral Vein: No evidence of thrombus. Normal compressibility, respiratory phasicity and response to augmentation. Saphenofemoral Junction: No evidence of thrombus. Normal compressibility and flow on color Doppler imaging. Profunda Femoral Vein: No evidence of thrombus. Normal compressibility and flow on color Doppler imaging. Femoral Vein: No evidence of thrombus. Normal compressibility, respiratory phasicity and response to augmentation. Popliteal Vein: No evidence of thrombus. Normal compressibility, respiratory phasicity and response to augmentation. Calf Veins: No evidence of thrombus. Normal compressibility and flow on color Doppler imaging. Superficial Great Saphenous Vein: No evidence of thrombus. Normal compressibility. Venous Reflux:  None. Other Findings: No evidence of superficial thrombophlebitis or abnormal fluid collection. IMPRESSION: No evidence of right lower extremity deep venous thrombosis. Electronically Signed   By: Irish Lack M.D.   On: 09/03/2023 13:16   DG Chest 2 View  Result Date: 09/03/2023 CLINICAL DATA:  Shortness of breath and CHF. EXAM: CHEST - 2 VIEW COMPARISON:  X-ray 07/26/2023.  Older exams as well  FINDINGS: Stable right IJ catheter. No consolidation, pneumothorax or effusion. No edema. Normal cardiopericardial silhouette. Calcified aorta. Surgical clips overlie the axillary regions. Slight linear opacity left lung base likely scar or atelectasis. IMPRESSION: Chest port.  Slight left lung base scar or atelectasis. Electronically Signed   By: Karen Kays M.D.   On: 09/03/2023 13:12    Procedures Procedures    Medications Ordered in ED Medications  albuterol (VENTOLIN HFA) 108 (90 Base) MCG/ACT inhaler 2 puff (has no administration in time range)    ED Course/ Medical Decision Making/ A&P                                 Medical Decision Making Amount and/or Complexity of Data Reviewed Labs: ordered. Radiology: ordered.  Risk Prescription drug management.  This patient presents to the ED for concern of right lower extremity pain, this involves an extensive number of treatment options, and is a complaint that carries with it a high risk of complications and morbidity.  The differential diagnosis includes DVT, muscle spasm, contusion, cellulitis  My initial workup includes labs, imaging  Additional history obtained from: Nursing notes from this visit.  I ordered, reviewed and interpreted labs which include: CBC, CMP, BNP, D-dimer leukocytosis of 13.6, stable anemia with hemoglobin of 9.6.  Hypokalemia 3.2.  Creatinine stable at 1.45.  D-dimer negative.  BNP at baseline.  I ordered imaging studies including chest x-ray, RLE DVT study I independently visualized and interpreted imaging which showed negative DVT study, slight left lung base scarring versus atelectasis, otherwise negative chest x-ray I agree with the radiologist interpretation  Afebrile, hemodynamically stable.  52 year old female presenting for evaluation for right lower extremity pain.  She is concerned for DVT as she states that this feels similar to when she has had DVTs in the past.  She is not currently  anticoagulated.  She is fairly sedentary due to her chronic conditions.  She is currently on palliative care.  She appears well on physical exam.  Slight labored breathing appears to be her baseline.  No adventitious breath sounds.  No worsening of her oxygen requirement.  Lab workup is reassuring.  D-dimer negative.  DVT study negative.  Very low suspicion for this as the cause of her symptoms.  low suspicion for PE given her reassuring workup as well.  Overall suspect musculoskeletal pain as the cause of her symptoms.  She was encouraged to take Tylenol as needed for pain.  She was sent a prescription for Robaxin and educated on potential side effects.  She was encouraged to follow-up with her primary care provider in 1 week for reevaluation of symptoms.  She was given return precautions.  Stable at discharge.  At this time there does not appear to be any evidence of an acute emergency medical condition and the patient appears stable for discharge with appropriate outpatient follow up. Diagnosis was discussed with patient who verbalizes understanding of care plan and is agreeable to discharge. I have discussed return precautions with patient who verbalizes understanding. Patient encouraged to follow-up with their PCP within 1 week. All questions answered.  Patient's case discussed with Dr. Particia Nearing who agrees with plan to discharge with follow-up.   Note: Portions of this report may have been transcribed using voice recognition software. Every effort was made to ensure  accuracy; however, inadvertent computerized transcription errors may still be present.        Final Clinical Impression(s) / ED Diagnoses Final diagnoses:  Right leg pain    Rx / DC Orders ED Discharge Orders          Ordered    methocarbamol (ROBAXIN) 500 MG tablet  2 times daily        09/03/23 1436              Michelle Piper, Cordelia Poche 09/03/23 1437    Jacalyn Lefevre, MD 09/03/23 1524

## 2023-09-03 NOTE — Discharge Instructions (Addendum)
You have been seen today for your complaint of right leg pain. Your lab work was reassuring. Your imaging was reassuring and showed no blood clot in your leg and no significant abnormality of your lungs. Your discharge medications include Robaxin. This is a muscle relaxer. It may cause drowsiness. Do not drive, operate heavy machinery or make important decisions when taking this medication. Only take it at night until you know how it affects you. Only take it as needed and take other medications such as ibuprofen or tylenol prior to trying this medication. Follow up with: Your primary care doctor in 1 week for reevaluation Please seek immediate medical care if you develop any of the following symptoms: You have a new injury and your pain is worse or different. You feel numb or you have tingling in the painful area. At this time there does not appear to be the presence of an emergent medical condition, however there is always the potential for conditions to change. Please read and follow the below instructions.  Do not take your medicine if  develop an itchy rash, swelling in your mouth or lips, or difficulty breathing; call 911 and seek immediate emergency medical attention if this occurs.  You may review your lab tests and imaging results in their entirety on your MyChart account.  Please discuss all results of fully with your primary care provider and other specialist at your follow-up visit.  Note: Portions of this text may have been transcribed using voice recognition software. Every effort was made to ensure accuracy; however, inadvertent computerized transcription errors may still be present.

## 2023-09-03 NOTE — ED Notes (Signed)
Pt remains in radiology 

## 2023-09-06 ENCOUNTER — Other Ambulatory Visit (HOSPITAL_COMMUNITY): Payer: Self-pay

## 2023-09-06 ENCOUNTER — Ambulatory Visit: Payer: Medicare Other | Admitting: Psychiatry

## 2023-09-10 ENCOUNTER — Ambulatory Visit (INDEPENDENT_AMBULATORY_CARE_PROVIDER_SITE_OTHER): Payer: BC Managed Care – PPO

## 2023-09-11 ENCOUNTER — Ambulatory Visit (HOSPITAL_BASED_OUTPATIENT_CLINIC_OR_DEPARTMENT_OTHER): Payer: BC Managed Care – PPO | Admitting: Pulmonary Disease

## 2023-09-11 ENCOUNTER — Encounter (HOSPITAL_BASED_OUTPATIENT_CLINIC_OR_DEPARTMENT_OTHER): Payer: Self-pay | Admitting: Pulmonary Disease

## 2023-09-11 VITALS — BP 108/78 | HR 111 | Ht 65.0 in | Wt 164.6 lb

## 2023-09-11 DIAGNOSIS — R410 Disorientation, unspecified: Secondary | ICD-10-CM

## 2023-09-11 NOTE — Progress Notes (Signed)
Patient was in her third lap of walking test. Her vitals had been normal with O2 97% and her heart rate 115 at highest. Patient states that she always has a high rate. We talked all three laps about family and Dr Everardo All and her health conditions. When we turned the corner to go back to room she stopped talking. She had forgotten what she was talking about and asked how she got here. I tried to explain that she was at Dr Henderson [place] office and she said no. She asked if her mom and dad were her to pick her up yet. She kept looking around seeming like she didn't know where she was at all. She seemed out of it until she was put in a wheel chair and was told she needed to go to ER. Then she remembered everything about her heatlh condition, he husbands phone number and was speaking clearly as she told him to come get her that she was not going to the ER as she was just there and did not need to go there again. She also continued to talk to me for about 20 minutes while we waited and she had no issues with memory or speaking.

## 2023-09-11 NOTE — Patient Instructions (Signed)
Recommend ED evaluation. Patient declined

## 2023-09-11 NOTE — Progress Notes (Signed)
Subjective:   PATIENT ID: [name], MRN: [id number]   HPI  Chief Complaint  Patient presents with   Follow-up   Reason for Visit: Follow-up  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  Synopsis: Previously followed at [facility] for cough variant asthma, subcentimeter lung nodules (stable) and chronic hypoxemia of unknown etiology. Patient had previously had concerns for pulmonary fibrosis however after no evidence of parenchymal disease on chest imaging. 2020 - Initially consulted with [facility] for hemoptysis. Work-up including bronchoscopy and CTA were negative and she has not had significant recurrence since then except mild episodes during exacerbations. Started on Nucala in 09/2019 for asthma however lost to follow-up from December 2020 to April 2021 2021 - Multiple hospitalizations including for kidney stone pain, migraine, bacteremia 2/2 port infection, LUE cellulitis 2022 - Re-established care and scheduled to start Nucala however unable to start. Had COVID and received antibody infusion 2023 - Had 3 hospital admissions including line associated bacteremia requiring port removal, readmission with sepsis, AKI and recently pneumonia in October  10/15/22 Since our last visit she was hospitalized for cellulitis of the left hand in December. Required stress dose steroids in setting of adrenal insufficiency. Had tunneled placed for comfort  and is seeing palliative care. She is compliant with her Advair and Spiriva, tolerating new inhaler well. Compliant with home O2. Chest vest was received and she reports using it three times a day with improved sputum production and breathing after use.  05/16/23 Since our last visit she was seen by palliative and enrolled into hospice for symptom management and then was discharged in April after deeming her life expectancy was >6 months after April evaluation. She was hospitalized in April for fevers with negative work-up. She was hospitalized again in June for respiratory failure from presumed pneumonia with improvement in 48 hours. Recently hospitalized in July for nausea and vomiting and weakness for possible adrenal crisis and sepsis secondary to bacillus bacteremia. ID recommended port removal however patient declined. She reports her pain has been less controlled since she is no longer in hospice and feels her overall quality of life has declined due to this. She was considered in re-enrollment for hospice on 03/22/23 and reports that she prefers to avoid hospitalizations however no further notes after this.   She reports her shortness of breath and feels albuterol with partial improvement 4-5 times and nebulizers once a night. Her symptoms worsen at night and will need nebulizer at night. Uses chest vest intermittently and trying to use it more often. Reports difficulty swallowing and weight loss.   09/11/23 Patient presented to Pulmonary clinic for appointment and ambulatory O2. During ambulatory O2 walk she had episode of confusion. She was unable to recognize me and not appropriately responsive to questions. She asked "Where am I? I came here for a walk test?" She did not respond to questions and unable to respond to questions for about five minutes. Husband of patient was called and he replied that she has had these sporadic episodes for the  last three months and followed by Neurologist. I have  advised patient to go to the ED for work-up. Her husband is aware of the recommendations. Repeat vitals stable. She has left the office against medical advice prior being transported to the ED. I have advised her to not drive home due to safety. She states her husband will pick her up and leave the vehicle here.  Prior Inhalers: Breo Incruse (cost too high) Advair - current  Social History: Has two adult daughters Cares for her teenage son with atypical cerebral palsy  Environmental exposures:  Hx chemotherapy  CareEverywhere Records Reviewed and Summarized: Pulm at [facility]      09/18/17 Bronchoscopy with BAL. Negative cultures and negative cytology.      Dr. Cheri Rous documented on 01/31/18 that patient's chest imaging was presented in 10/2017 and her bronchial fibroelastotic scar was felt to be a nonspecific injury related to reflux or prior XRT and not related to her chronic cough. 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  recurrence. Currently has monthly port flushes.      The note also comments on PCP work-up comments on patient reporting hematuria with UA showing 1+ blood however this is not seen on recent PCP note.  IM at [facility] -       Per  PCP note on 03/17/19, patient previously seen by Cardiology for CHF with right heart failure and pH with EF 35% however EF normalized on echo 11/2018  Past Medical History:  Diagnosis Date   Addison's disease [facility])    Adrenal insufficiency (HCC)    Anemia    Anxiety    Aortic stenosis    Aortic stenosis    Appendicitis 12/19/09   Appendicitis    Breast cancer (HCC)    STATUS POST BILATERAL MASTECTOMY. STATUS POST RECONSTRUCTION. SHE HAD SILICONE BREAST IMPLANTS AND THE LEFT IMPLANT IS LEAKING SLIGHTLY   Cellulitis of right middle finger 11/07/2018   Cervical cancer (HCC) 12/23/2018   Chest pain    CHF with right heart failure (HCC) 04/17/2017   Chronic  respiratory failure with hypoxia (HCC) 12/23/2018   Cough variant asthma 04/13/2019   Depression    GERD (gastroesophageal reflux disease)    takes Dexilant and carafate and gi coctail    Headache    migraines on a daily and monthly regimen    Heart murmur    History of kidney stones    Hodgkin lymphoma (HCC)    STATUS POST MANTLE RADIATION   Hodgkin's lymphoma (HCC)    1987   Hypertension    Hypoxia    Necrotizing fasciitis (HCC) 12/23/2018   Non-ischemic cardiomyopathy (HCC)    Osteoporosis    Palpitations    Pituitary adenoma (HCC) 12/23/2018   Pneumonia    PONV (postoperative nausea and vomiting)    Pre-diabetes    per pt; no meds   Pulmonary hypertension (HCC) 12/23/2018   Raynaud phenomenon    Right heart failure (HCC) 04/17/2017   Seizures (HCC)    last febrile seizure was approx 3 weeks ago per report on 12/01/2020   Sleep apnea    upcoming sleep study per pt    Supplemental oxygen dependent    3 liters   SVT (supraventricular tachycardia) (HCC)    Tachycardia    Thyroid cancer (HCC)    STATUS POST SURGICAL REMOVAL-CURRENT ON THYROID REPLACEMENT     Allergies  Allergen Reactions   Ferrous Bisglycinate Chelate [Iron] Anaphylaxis and Other (See Comments)    Only IV - only FERRLICET   Mushroom Extract Complex (Do Not Select) Anaphylaxis   Na Ferric Gluc Cplx In Sucrose Anaphylaxis   Cymbalta [Duloxetine Hcl] Swelling and Anxiety   Hydromorphone Other (See Comments)    BP drop and heart rate drops 5.1.20 PT REPORTS THAT SHE TAKES DILAUDID AT HOME   Ondansetron Hcl Other (See Comments)   Promethazine Other (See Comments)    Other reaction(s): Unknown   Succinylcholine Other (See Comments)    Lock Jaw   Buprenorphine Hcl Hives   Compazine Other (See Comments)    Altered mental status Aggression   Duloxetine     Other reaction(s): Not available   Metoclopramide Other (See Comments)    Dystonia   Morphine And Codeine Hives   Ondansetron Hives and Rash    Other  reaction(s): Unknown Other reaction(s): Unknown   Promethazine Hcl Hives   Tegaderm Ag Mesh [Silver] Rash    Old formulation only, is able to tolerate new formulation     Outpatient Medications Prior to Visit  Medication Sig Dispense Refill   ALPRAZolam (XANAX) 1 MG tablet 1 tablet daily and 1 tablet PRN no more than 10 days a month. 40 tablet 0   aspirin EC 81 MG tablet Take 1 tablet (81 mg total) by mouth daily. Swallow whole. (Patient taking differently: Take 81 mg by mouth at bedtime. Chewable) 90 tablet 3   Bismuth Tribromoph-Petrolatum (XEROFORM OCCLUSIVE GAUZE STRIP) PADS Apply 1 each topically as directed. 50 each 1   bisoprolol (ZEBETA) 5 MG tablet Take 2.5 mg by mouth daily.     bumetanide (BUMEX) 1 MG tablet TAKE 1 TABLET BY MOUTH TWICE A DAY (Patient taking differently: Take 1 mg by mouth 2 (two) times daily.) 180 tablet 3   buPROPion (WELLBUTRIN XL) 150 MG 24 hr tablet Take 1 tablet (150 mg total) by mouth daily. 90 tablet 0   clonazePAM (KLONOPIN) 1 MG tablet Take 1 tablet (1 mg total) by mouth 2 (two) times daily. 60 tablet 2   cyclobenzaprine (FLEXERIL) 5 MG tablet Take 1 tablet (5 mg total) by mouth 3 (three) times daily as needed for muscle spasms. 20 tablet 0   diclofenac (VOLTAREN) 50 MG EC tablet Take 50 mg by mouth 2 (two) times daily. *as needed for migraine, Can repeat if needed*     diphenhydrAMINE (BENADRYL) 12.5 MG/5ML elixir Take 10 mLs (25 mg total) by mouth every 6 (six) hours as needed (nausea). 120 mL 0   doxycycline (VIBRA-TABS) 100 MG tablet Take 1 tablet (100 mg total) by mouth 2 (two) times daily. 14 tablet 0   EMGALITY 120 MG/ML SOSY Inject 120 mg into the skin every 28 (twenty-eight) days.     EPINEPHRINE 0.3 mg/0.3 mL IJ SOAJ injection INJECT 0.3 MG INTO THE MUSCLE AS NEEDED FOR ANAPHYLAXIS. 2 each 1   escitalopram (LEXAPRO) 20 MG tablet TAKE 1 TABLET BY MOUTH EVERY DAY IN THE EVENING (Patient taking differently: Take 20 mg by mouth daily in the afternoon.  Afternoon) 90 tablet 1   famotidine (PEPCID) 40 MG/5ML suspension Take 1.3 mLs (10.4 mg total) by mouth daily. 50 mL 0   FLORASTOR 250 MG capsule Take 250 mg by mouth 2 (two) times daily. Mid Morning and Mid Afternoon     fluticasone-salmeterol (ADVAIR HFA) 230-21 MCG/ACT inhaler INHALE 2 PUFFS INTO THE LUNGS TWICE A DAY 12 each 5  Heparin Na, Pork, Lock Flsh PF (BD HEPARIN POSIFLUSH) 100 UNIT/ML SOLN USE 5 MLS IN PORT A CATH ONCE DAILY AFTER MEDICATION ADMINISTRATION AS A HEPLOCK AS DIRECTED. 150 mL 3   hydrocortisone (CORTEF) 10 MG tablet Take 1-2 tablets (10-20 mg total) by mouth See admin instructions. Take 20 mg in the am and 10mg  in the evening. Take after completing prednisone (Patient taking differently: Take 10-20 mg by mouth See admin instructions. Take 20 mg in the am at 10 am and 10 mg in the afternoon. Take after completing prednisone)     hydrOXYzine (ATARAX) 25 MG tablet Take 1 tablet (25 mg total) by mouth every 6 (six) hours as needed for anxiety. 30 tablet 0   JANUVIA 100 MG tablet Take 100 mg by mouth daily.     lamoTRIgine (LAMICTAL) 200 MG tablet TAKE 1 TABLET BY MOUTH EVERYDAY AT BEDTIME 90 tablet 0   lamoTRIgine (LAMICTAL) 25 MG tablet Take two tablets every morning. 180 tablet 0   Lancets (ONETOUCH DELICA PLUS LANCET33G) MISC 3 (three) times daily. for testing     lansoprazole (PREVACID) 15 MG capsule Take 30 mg by mouth daily at 12 noon.     LORazepam (ATIVAN) 0.5 MG tablet Take 1 tablet (0.5 mg total) by mouth 3 (three) times daily as needed for anxiety. (Patient taking differently: Place 0.5 mg into feeding tube 3 (three) times daily as needed for anxiety. Access Port For Nausea) 12 tablet 0   methocarbamol (ROBAXIN) 500 MG tablet Take 1 tablet (500 mg total) by mouth 2 (two) times daily. 20 tablet 0   nitroGLYCERIN (NITROSTAT) 0.4 MG SL tablet DISSOLVE 1 TAB UNDER THE TONGUE EVERY 5 MINUTES AS NEEDED FOR CHEST PAIN. MAX OF 3 DOSES, THEN 911. 75 tablet 0   NURTEC 75 MG TBDP  Take 1 tablet by mouth daily as needed.     OLANZapine (ZYPREXA) 5 MG tablet Take 1 tablet (5 mg total) by mouth at bedtime. 30 tablet 2   ONETOUCH VERIO test strip 3 (three) times daily. for testing     Oxycodone HCl 10 MG TABS Take 1 tablet by mouth every 6 (six) hours as needed (pain).     OXYGEN Inhale 4-5 L/min into the lungs continuous.     PROAIR HFA 108 (90 Base) MCG/ACT inhaler Inhale 2 puffs into the lungs every 4 (four) hours as needed for wheezing or shortness of breath.     QVAR REDIHALER 80 MCG/ACT inhaler Inhale 1 puff into the lungs 2 (two) times daily.     RABEprazole (ACIPHEX) 20 MG tablet Take 1 tablet (20 mg total) by mouth daily. 30 tablet 0   rosuvastatin (CRESTOR) 10 MG tablet Take 10 mg by mouth in the morning. Mid morning     sacubitril-valsartan (ENTRESTO) 24-26 MG TAKE 1 TABLET BY MOUTH TWICE A DAY 60 tablet 5   silver sulfADIAZINE (SILVADENE) 1 % cream Apply to affected area daily (Patient taking differently: Apply 1 Application topically daily as needed.) 50 g 1   Sodium Chloride Flush (NORMAL SALINE FLUSH) 0.9 % SOLN Use as directed for central venous catheter maintenance 600 mL 3   Sodium Chloride Flush (NORMAL SALINE FLUSH) 0.9 % SOLN Use as directed for central venous catheter maintenance 600 mL 3   sucralfate (CARAFATE) 1 GM/10ML suspension Take 1 g by mouth daily as needed (as directed for ulcers).     SYNTHROID 100 MCG tablet Take 1 tablet (100 mcg total) by mouth every morning. (Patient taking  differently: Take 100 mcg by mouth every morning. * Must be name brand*)     Tiotropium Bromide Monohydrate (SPIRIVA RESPIMAT) 1.25 MCG/ACT AERS Inhale 2 puffs into the lungs daily. 4 g 5   topiramate (TOPAMAX) 50 MG tablet Take 150 mg by mouth at bedtime.      zolpidem (AMBIEN CR) 12.5 MG CR tablet TAKE ONE TABLET BY MOUTH AT BEDTIME AS NEEDED FOR SLEEP. 90 tablet 0   albuterol (PROVENTIL) (2.5 MG/3ML) 0.083% nebulizer solution Take 3 mLs (2.5 mg total) by nebulization  every 6 (six) hours as needed for wheezing or shortness of breath. 360 mL 2   famotidine (PEPCID) 40 MG/5ML suspension Take 2.5 mLs (20 mg total) by mouth daily. 75 mL 0   No facility-administered medications prior to visit.    Review of Systems  Constitutional:  Negative for chills, diaphoresis, fever, malaise/fatigue and weight loss.  HENT:  Negative for congestion.   Respiratory:  Positive for cough, sputum production and shortness of breath. Negative for hemoptysis and wheezing.   Cardiovascular:  Negative for chest pain, palpitations and leg swelling.    Objective:   Vitals:   09/11/23 1614 09/11/23 1644  BP:  108/78  Pulse: (!) 112 (!) 111  SpO2: 97% 96%  Weight: 164 lb 9.6 oz (74.7 kg)   Height: 5\' 5"  (1.651 m)     SpO2: 96 %  Physical Exam: General: Chronically ill-appearing, confused HENT: [place], AT, OP clear, MMM Eyes: EOMI, no scleral icterus Respiratory: No respiratory distress Neuro: Awake, disoriented CNII-XII grossly intact  Data Reviewed:  Imaging: CXR 04/12/06 - Normal CXR. No infiltrate, edema or effusion. CT 05/05/09 - Normal parenchymal. Scattered tiny pulmonary nodules with largest 3mm in LUL likely benign CT Chest 09/05/17 (report only): Stable bilateral pulmonary nodules up to 4mm (LUL 3mm, LUL 2 mm, RLL subpleural 4mm), redemonstration of tiny right diaphragmatic hernia containing fat CT Chest 11/10/18 - RML 5 mm lung nodule, unchanged CT Chest HR 04/04/20 - Mild air trapping. Air trapping on expiration. No ILD. CT Chest 08/13/22 - Air trapping. 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 Subcentimeter nodules <5 mm. CT CAP 01/23/23 - No acute abnormalities, trace bilateral pleural effusions, 11 mm indeterminate cortical hypodensity within the posterior left interpolar region.  PFT: 04/16/19 FVC 2.42 (65%) FEV1 2.03 (68%) Ratio 76  TLC 84% DLCO 75% Interpretation: Moderate reversible obstructive defect. Significant BD response in FEV1.  CPET Per [facility]  records  CPET 03/2016 showed functional impairment with VO77max 65% predicted. Normal oxygenation throughout on room air with post-exercise PaO2=125. HR 82% predicted. Abnormal FEV1 but ventilatory reserve remained. Conclusion: Cardiovascular limitation to exercise possibly reflecting deconditioning. Ventilatory impairment present but not exercise-limiting.  6 min walk  Per [facility] records (12/07/16): SpO2 started at 97-98% and decreased to ~94% with ambulation. There was one episode of symptomatic dizziness during which SpO2 was 88% but with questionable tracing. Recovered to high-90s with ~10-15 seconds of standing rest break.  [facility] 08/13/19  Unable to complete . Patient had episode of coughing while walking and dizziness resulting in witnessed fall. Staff reported patient was disoriented and confused.  POC glucose was 109. Pulse oximetry >96% and tachycardia 110. No desaturations documented during this time.  Ambulatory O2 Titration 04/13/19 - Patient unable to complete due to dizziness with ambulation. No desaturations documented. SpO2>95% in the time titration was attempted on room air. Per nursing, patient anxious and holding breath during testing which likely contributed to early termination of test  Labs:  ANA Screen 07/03/17 -  Positive  Feb 2020 ANA - neg SSA/SSB - neg Anti-DNA ab -neg MPO/PR3 ab - ng GBM ab - neg Anti-scleroderma ab - neg APLAS - neg ANCA titers - neg RF mildly elevated 18.6  Bronchoscopy: 11/19/18   11/19/18 TBFNA station 7 (mislabeled as bronchial brushing) - negative for malignant cells  11/19/18 Respiratory/fungal, AFB from BAL of L lingula - negative    Assessment & Plan:   Discussion: 52 year old female with complex medical history as noted below. Previously followed at [facility] for cough variant asthma, subcentimeter lung nodules (stable) and chronic hypoxemic of unknown etiology (normal lung parenchyma by biopsy and chest imaging).    She was initially established with [facility] for hemoptysis. Work-up including bronchoscopy and CTA were negative and she has not had significant recurrence since then except mild episodes during exacerbations. For her asthma she was started on Nucala in 09/2019. Lost to follow-up and re-established care in 2022. Attempted to restart Nucala however she had medical issues preventing restart.  Recent multiple hospitalizations including pneumonia in October 2023 and Dec 2023. Recently had April, June and July has well for multiple reasons including sepsis of unknown origin, respiratory failure, sepsis secondary to bacillus, concerning for contaminant. She had declined port removal due to goals of care.  Presented to day for ambulatory O2. Nadir 90% and then became confused.   Confusion  Patient presented to Pulmonary clinic for appointment and ambulatory O2. During ambulatory O2 walk she had episode of confusion. She was unable to recognize me and not appropriately responsive to questions. She asked "Where am I? I came here for a walk test?" She did not respond to questions and unable to respond to questions for about five minutes. Husband of patient was called and he replied that she has had these sporadic episodes for the last three months and followed by Neurologist. I have advised patient to go to the ED for work-up. Her husband is aware of the recommendations. Repeat vitals stable. She has left the office against medical advice prior being transported to the ED. I have advised her to not drive home due to safety. She states her husband will pick her up and leave the vehicle here.  Dyspnea of breath secondary muscular deconditioning Moderate persistent eosinophilic asthma Chronic bronchitis, failed flutter valve and 3% saline, improved on chest vest Probable diaphragm weakness in setting of deconditioning related to uncontrolled lung diseae - poor inspiratory and expiratory effort with  persistent air trapping on imaging despite current bronchodilator therapy. Likely contributes to her inability to clear secretions well --CONTINUE Advair 230-21 mcg TWO puff TWICE a day --CONTINUE Spiriva 1.25 mcg TWO puffs a day. This will replace Incruse --CONTINUE Albuterol AS NEEDED for shortness of breath or wheezing --Continue chest vest up to three times a day  Chronic hypoxemic respiratory failure Currently wearing 4L at home. This is self-purchased --CONTINUE supplemental oxygen for goal greater than >88% --Try to give yourself a break on lower oxygen levels as long as it is above 88%.  Right heart failure Anasarca --Diuresis per Cardiology/Heart failure team  DNR/DNI --Discharged from Hospice.  Health Maintenance Immunization History  Administered Date(s) Administered   Fluad Quad(high Dose 65+) 06/30/2020   Influenza, High Dose Seasonal PF 06/19/2019   Influenza, Quadrivalent, Recombinant, Inj, Pf 08/13/2018   Influenza, Seasonal, Injecte, Preservative Fre 06/23/2016, 11/12/2017, 08/13/2018   Influenza,inj,Quad PF,6+ Mos 07/04/2015, 07/04/2015, 06/23/2016, 07/03/2017, 11/12/2017, 08/13/2018   Influenza,inj,Quad PF,6-35 Mos 07/04/2015   Influenza,inj,quad, With Preservative 06/19/2019  Influenza-Unspecified 07/23/2007, 07/06/2008, 06/13/2009, 07/06/2010, 07/25/2011, 08/01/2012, 07/14/2013, 07/04/2015, 07/04/2015, 06/23/2016, 06/23/2016, 07/03/2017, 11/12/2017, 06/19/2019, 06/30/2020, 06/01/2021   Moderna Sars-Covid-2 Vaccination 12/15/2019, 01/12/2020   Pneumococcal Conjugate PCV 7 03/20/2019   Pneumococcal Conjugate-13 03/20/2019   Pneumococcal Polysaccharide-23 02/21/2017   Pneumococcal-Unspecified 02/21/2017   Td 12/19/2010   Td (Adult), 2 Lf Tetanus Toxid, Preservative Free 12/19/2010   Varicella 04/02/2007   CT Lung Screen - not qualified. Never smoker  No orders of the defined types were placed in this encounter.  No orders of the defined types were placed  in this encounter.  No follow-ups on file.  I have spent a total time of 40-minutes on the day of the appointment including chart review, data review, collecting history, coordinating care and discussing medical diagnosis and plan with the patient/family. Past medical history, allergies, medications were reviewed. Pertinent imaging, labs and tests included in this note have been reviewed and interpreted independently by me.  Chinonso Linker Mechele Collin, MD [facility] 09/11/2023 4:54 PM  Office Number [phone]

## 2023-09-14 ENCOUNTER — Other Ambulatory Visit: Payer: Self-pay | Admitting: Adult Health

## 2023-09-14 DIAGNOSIS — F331 Major depressive disorder, recurrent, moderate: Secondary | ICD-10-CM

## 2023-09-16 ENCOUNTER — Other Ambulatory Visit (HOSPITAL_COMMUNITY): Payer: Self-pay | Admitting: Otolaryngology

## 2023-09-16 ENCOUNTER — Ambulatory Visit (HOSPITAL_COMMUNITY)
Admission: RE | Admit: 2023-09-16 | Discharge: 2023-09-16 | Disposition: A | Discharge status: Home / Self Care | Payer: BC Managed Care – PPO | Source: Ambulatory Visit | Attending: Internal Medicine | Admitting: Internal Medicine

## 2023-09-16 DIAGNOSIS — R1310 Dysphagia, unspecified: Secondary | ICD-10-CM | POA: Insufficient documentation

## 2023-09-16 DIAGNOSIS — R059 Cough, unspecified: Secondary | ICD-10-CM

## 2023-09-16 DIAGNOSIS — R490 Dysphonia: Secondary | ICD-10-CM | POA: Diagnosis not present

## 2023-09-16 NOTE — Therapy (Signed)
Modified Barium Swallow Study  Patient Details  Name: [name] MRN: [id number] Date of Birth: April 09, 1971  Today's Date: 09/16/2023  Modified Barium Swallow completed.  Full report located under Chart Review in the Imaging Section.  History of Present Illness [name] is a 52 y.o. female with PMH: chronic lymphedema, hx cushings secondary to pituitary adenoma s/p resection and gamma knife complicated by adrenal insufficiency, history of Hodgkin's at age 43 s/p chemoradiation, breast cancer s/p chemotherapy and bilateral mastectomy in 2000 which is in remission, cervical cancer s/p LEEP 2002, ovarian tumor "precancerous" s/p TH and left oophorectomy in 2011 and right in 2016, hx postablative hypothyroidism, ADHD, anxiety/depression and chronic hypoxemic respiratory failure secondary to unknown etiology. She presented to this outpatient modified barium swallow study secondary to c/o significant difficult with swallowing solids, globus sensation, difficulty swallowing larger pills and her c/o vocal changes. She does present with a hoarse sounding voice. She told SLP that she has not had solid foods in about a year and when SLP asked if anyone had mentioned possible need for a feeding tube (PEG) she replied, "I think we might be getting close" and that "this test is to help decide that".   Clinical Impression Patient presents with a normal oropharyngeal swallow however she does present with a suspected primary esophageal dysphagia as per this modified barium swallow study. 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 Thicker barium consistencies (honey thick, pudding thick and mechanical soft solids did transit a little slowly through PES and upper esophagus but no retrograde movement or stasis of barium visualized in upper esophagus or at level of PES. Patient attempted to swallow barium tablet with plain water but she gagged while tablet still  in mouth and spit it out. She reports this is something that can occur without warning when she is eating or taking medications. No aspiration or penetration observed and oral and pharyngeal structures all appeared to be [facility]. SLP recommended patient see an outpatient SLP for help with reintroducing solid foods into her diet.   Factors that may increase risk of adverse event in presence of aspiration [name] 2021):    Swallow Evaluation Recommendations Recommendations: PO diet PO Diet Recommendation: Regular;Thin liquids (Level 0) Liquid Administration via: Cup;Straw Medication Administration: Whole meds with liquid Supervision: Patient able to self-feed      [name] 09/16/2023,5:02 PM

## 2023-09-18 ENCOUNTER — Other Ambulatory Visit (HOSPITAL_COMMUNITY): Payer: Self-pay

## 2023-09-18 MED ORDER — NORMAL SALINE FLUSH 0.9 % IV SOLN
INTRAVENOUS | 3 refills | Status: DC
  Filled 2023-09-18: qty 600, 4d supply, fill #0
  Filled 2023-09-30: qty 600, 4d supply, fill #1
  Filled 2023-10-12: qty 600, 4d supply, fill #2
  Filled 2023-10-28: qty 600, 4d supply, fill #3

## 2023-09-19 ENCOUNTER — Telehealth: Payer: BC Managed Care – PPO | Admitting: Adult Health

## 2023-09-19 ENCOUNTER — Encounter: Payer: Self-pay | Admitting: Adult Health

## 2023-09-19 DIAGNOSIS — F331 Major depressive disorder, recurrent, moderate: Secondary | ICD-10-CM

## 2023-09-19 DIAGNOSIS — F410 Panic disorder [episodic paroxysmal anxiety] without agoraphobia: Secondary | ICD-10-CM | POA: Diagnosis not present

## 2023-09-19 DIAGNOSIS — F411 Generalized anxiety disorder: Secondary | ICD-10-CM | POA: Diagnosis not present

## 2023-09-19 DIAGNOSIS — G4700 Insomnia, unspecified: Secondary | ICD-10-CM

## 2023-09-19 MED ORDER — ESCITALOPRAM OXALATE 20 MG PO TABS: 20.0000 mg | ORAL_TABLET | Freq: Every evening | ORAL | 5 refills | Status: DC

## 2023-09-19 MED ORDER — LAMOTRIGINE 200 MG PO TABS: ORAL_TABLET | ORAL | 0 refills | Status: DC

## 2023-09-19 MED ORDER — CLONAZEPAM 1 MG PO TABS: 1.0000 mg | ORAL_TABLET | Freq: Two times a day (BID) | ORAL | 2 refills | Status: DC

## 2023-09-19 MED ORDER — OLANZAPINE 5 MG PO TABS: 5.0000 mg | ORAL_TABLET | Freq: Every day | ORAL | 2 refills | Status: DC

## 2023-09-19 MED ORDER — BUPROPION HCL ER (XL) 150 MG PO TB24: 150.0000 mg | ORAL_TABLET | Freq: Every day | ORAL | 0 refills | Status: DC

## 2023-09-19 MED ORDER — ALPRAZOLAM 1 MG PO TABS: ORAL_TABLET | ORAL | 0 refills | Status: DC

## 2023-09-19 MED ORDER — LAMOTRIGINE 25 MG PO TABS: ORAL_TABLET | ORAL | 0 refills | Status: DC

## 2023-09-19 NOTE — Progress Notes (Signed)
[name] [id number] [birth year] 52 y.o.  Virtual Visit via Video Note  I connected with pt @ on 09/19/23 at  2:00 PM EST by a video enabled telemedicine application and verified that I am speaking with the correct person using two identifiers.   I discussed the limitations of evaluation and management by telemedicine and the availability of in person appointments. The patient expressed understanding and agreed to proceed.  I discussed the assessment and treatment plan with the patient. The patient was provided an opportunity to ask questions and all were answered. The patient agreed with the plan and demonstrated an understanding of the instructions.   The patient was advised to call back or seek an in-person evaluation if the symptoms worsen or if the condition fails to improve as anticipated.  I provided 25 minutes of non-face-to-face time during this encounter.  The patient was located at home.  The provider was located at [facility].   [name], NP   Subjective:   Patient ID:  [name] is a 52 y.o. (DOB Jun 12, 1971) female.  Chief Complaint: No chief complaint on file.   HPI [name] presents for follow-up of MDD, GAD, insomnia, and panic attacks.  Describes mood today as "a little better". Pleasant. Tearful. Mood symptoms - reports depression, anxiety and irritability. Reports panic attacks. Reports decreased worry, rumination, and over thinking. Mood has improved. Stating "I'm feel like I'm doing better with the medication changes". Reports health continues to decline - reports recent ED visit. Family supportive. Decreased interest and motivation. Taking medications as prescribed. Working with [name] for therapy.  Energy levels improving. Active, does not have a regular exercise routine with current physical disabilities. Is able to enjoy some usual interests and activities. Married. Lives with husband and son. Has a daughter - 45 in [place]. Spending time with family.  Appetite adequate. Weight gain 158 pounds. Reports sleep has improved with addition of Zyprexa.  Reports issues with focus and concentration difficulties. Completing tasks. Managing aspects of household. Disabled since 2016. Denies SI or HI.  Denies AH or VH. Denies self harm. Denies substance use.   Previous medication trials: Wellbutrin - mean, Zoloft-not helpful    Review of Systems:  Review of Systems  Musculoskeletal:  Negative for gait problem.  Neurological:  Negative for tremors.  Psychiatric/Behavioral:         Please refer to HPI    Medications: I have reviewed the patient's current medications.  Current Outpatient Medications  Medication Sig Dispense Refill   albuterol (PROVENTIL) (2.5 MG/3ML) 0.083% nebulizer solution Take 3 mLs (2.5 mg total) by nebulization every 6 (six) hours as needed for wheezing or shortness of breath. 360 mL 2   ALPRAZolam (XANAX) 1 MG tablet 1 tablet daily and 1 tablet PRN no more than 10 days a month. 40 tablet 0   aspirin EC 81 MG tablet Take 1 tablet (81 mg total) by mouth daily. Swallow whole. (Patient taking differently: Take 81 mg by mouth at bedtime. Chewable) 90 tablet 3   Bismuth Tribromoph-Petrolatum (XEROFORM OCCLUSIVE GAUZE STRIP) PADS Apply 1 each topically as directed. 50 each 1   bisoprolol (ZEBETA) 5 MG tablet Take 2.5 mg by mouth daily.     bumetanide (BUMEX) 1 MG tablet TAKE 1 TABLET BY MOUTH TWICE A DAY (Patient taking differently: Take 1 mg by mouth 2 (two) times daily.) 180 tablet 3   buPROPion (WELLBUTRIN XL) 150 MG 24 hr tablet Take 1 tablet (  150 mg total) by mouth daily. 90 tablet 0   clonazePAM (KLONOPIN) 1 MG tablet Take 1 tablet (1 mg total) by mouth 2 (two) times daily. 60 tablet 2   cyclobenzaprine (FLEXERIL) 5 MG tablet Take 1 tablet (5 mg total) by mouth 3 (three) times daily as needed for muscle spasms. 20 tablet 0   diclofenac (VOLTAREN) 50 MG EC tablet Take 50 mg by mouth 2  (two) times daily. *as needed for migraine, Can repeat if needed*     diphenhydrAMINE (BENADRYL) 12.5 MG/5ML elixir Take 10 mLs (25 mg total) by mouth every 6 (six) hours as needed (nausea). 120 mL 0   doxycycline (VIBRA-TABS) 100 MG tablet Take 1 tablet (100 mg total) by mouth 2 (two) times daily. 14 tablet 0   EMGALITY 120 MG/ML SOSY Inject 120 mg into the skin every 28 (twenty-eight) days.     EPINEPHRINE 0.3 mg/0.3 mL IJ SOAJ injection INJECT 0.3 MG INTO THE MUSCLE AS NEEDED FOR ANAPHYLAXIS. 2 each 1   escitalopram (LEXAPRO) 20 MG tablet TAKE 1 TABLET BY MOUTH EVERY DAY IN THE EVENING 30 tablet 0   famotidine (PEPCID) 40 MG/5ML suspension Take 2.5 mLs (20 mg total) by mouth daily. 75 mL 0   famotidine (PEPCID) 40 MG/5ML suspension Take 1.3 mLs (10.4 mg total) by mouth daily. 50 mL 0   FLORASTOR 250 MG capsule Take 250 mg by mouth 2 (two) times daily. Mid Morning and Mid Afternoon     fluticasone-salmeterol (ADVAIR HFA) 230-21 MCG/ACT inhaler INHALE 2 PUFFS INTO THE LUNGS TWICE A DAY 12 each 5   Heparin Na, Pork, Lock Flsh PF (BD HEPARIN POSIFLUSH) 100 UNIT/ML SOLN USE 5 MLS IN PORT A CATH ONCE DAILY AFTER MEDICATION ADMINISTRATION AS A HEPLOCK AS DIRECTED. 150 mL 3   hydrocortisone (CORTEF) 10 MG tablet Take 1-2 tablets (10-20 mg total) by mouth See admin instructions. Take 20 mg in the am and 10mg  in the evening. Take after completing prednisone (Patient taking differently: Take 10-20 mg by mouth See admin instructions. Take 20 mg in the am at 10 am and 10 mg in the afternoon. Take after completing prednisone)     hydrOXYzine (ATARAX) 25 MG tablet Take 1 tablet (25 mg total) by mouth every 6 (six) hours as needed for anxiety. 30 tablet 0   JANUVIA 100 MG tablet Take 100 mg by mouth daily.     lamoTRIgine (LAMICTAL) 200 MG tablet TAKE 1 TABLET BY MOUTH EVERYDAY AT BEDTIME 90 tablet 0   lamoTRIgine (LAMICTAL) 25 MG tablet Take two tablets every morning. 180 tablet 0   Lancets (ONETOUCH DELICA PLUS  LANCET33G) MISC 3 (three) times daily. for testing     lansoprazole (PREVACID) 15 MG capsule Take 30 mg by mouth daily at 12 noon.     LORazepam (ATIVAN) 0.5 MG tablet Take 1 tablet (0.5 mg total) by mouth 3 (three) times daily as needed for anxiety. (Patient taking differently: Place 0.5 mg into feeding tube 3 (three) times daily as needed for anxiety. Access Port For Nausea) 12 tablet 0   methocarbamol (ROBAXIN) 500 MG tablet Take 1 tablet (500 mg total) by mouth 2 (two) times daily. 20 tablet 0   nitroGLYCERIN (NITROSTAT) 0.4 MG SL tablet DISSOLVE 1 TAB UNDER THE TONGUE EVERY 5 MINUTES AS NEEDED FOR CHEST PAIN. MAX OF 3 DOSES, THEN 911. 75 tablet 0   NURTEC 75 MG TBDP Take 1 tablet by mouth daily as needed.     OLANZapine (ZYPREXA)  5 MG tablet Take 1 tablet (5 mg total) by mouth at bedtime. 30 tablet 2   ONETOUCH VERIO test strip 3 (three) times daily. for testing     Oxycodone HCl 10 MG TABS Take 1 tablet by mouth every 6 (six) hours as needed (pain).     OXYGEN Inhale 4-5 L/min into the lungs continuous.     PROAIR HFA 108 (90 Base) MCG/ACT inhaler Inhale 2 puffs into the lungs every 4 (four) hours as needed for wheezing or shortness of breath.     QVAR REDIHALER 80 MCG/ACT inhaler Inhale 1 puff into the lungs 2 (two) times daily.     RABEprazole (ACIPHEX) 20 MG tablet Take 1 tablet (20 mg total) by mouth daily. 30 tablet 0   rosuvastatin (CRESTOR) 10 MG tablet Take 10 mg by mouth in the morning. Mid morning     sacubitril-valsartan (ENTRESTO) 24-26 MG TAKE 1 TABLET BY MOUTH TWICE A DAY 60 tablet 5   Sodium Chloride Flush (NORMAL SALINE FLUSH) 0.9 % SOLN Use as directed for central venous catheter maintenance 600 mL 3   Sodium Chloride Flush (NORMAL SALINE FLUSH) 0.9 % SOLN Use as directed for central venous catheter maintenance 600 mL 3   sucralfate (CARAFATE) 1 GM/10ML suspension Take 1 g by mouth daily as needed (as directed for ulcers).     SYNTHROID 100 MCG tablet Take 1 tablet (100 mcg  total) by mouth every morning. (Patient taking differently: Take 100 mcg by mouth every morning. * Must be name brand*)     Tiotropium Bromide Monohydrate (SPIRIVA RESPIMAT) 1.25 MCG/ACT AERS Inhale 2 puffs into the lungs daily. 4 g 5   topiramate (TOPAMAX) 50 MG tablet Take 150 mg by mouth at bedtime.      zolpidem (AMBIEN CR) 12.5 MG CR tablet TAKE ONE TABLET BY MOUTH AT BEDTIME AS NEEDED FOR SLEEP. 90 tablet 0   No current facility-administered medications for this visit.    Medication Side Effects: None  Allergies:  Allergies  Allergen Reactions   Ferrous Bisglycinate Chelate [Iron] Anaphylaxis and Other (See Comments)    Only IV - only FERRLICET   Mushroom Extract Complex (Do Not Select) Anaphylaxis   Na Ferric Gluc Cplx In Sucrose Anaphylaxis   Cymbalta [Duloxetine Hcl] Swelling and Anxiety   Hydromorphone Other (See Comments)    BP drop and heart rate drops 5.1.20 PT REPORTS THAT SHE TAKES DILAUDID AT HOME   Ondansetron Hcl Other (See Comments)   Promethazine Other (See Comments)    Other reaction(s): Unknown   Succinylcholine Other (See Comments)    Lock Jaw   Buprenorphine Hcl Hives   Compazine Other (See Comments)    Altered mental status Aggression   Duloxetine     Other reaction(s): Not available   Metoclopramide Other (See Comments)    Dystonia   Morphine And Codeine Hives   Ondansetron Hives and Rash    Other reaction(s): Unknown Other reaction(s): Unknown   Promethazine Hcl Hives   Tegaderm Ag Mesh [Silver] Rash    Old formulation only, is able to tolerate new formulation    Past Medical History:  Diagnosis Date   Addison's disease (HCC)    Adrenal insufficiency (HCC)    Anemia    Anxiety    Aortic stenosis    Aortic stenosis    Appendicitis 12/19/09   Appendicitis    Breast cancer (HCC)    STATUS POST BILATERAL MASTECTOMY. STATUS POST RECONSTRUCTION. SHE HAD SILICONE BREAST IMPLANTS AND THE  LEFT IMPLANT IS LEAKING SLIGHTLY   Cellulitis of right  middle finger 11/07/2018   Cervical cancer (HCC) 12/23/2018   Chest pain    CHF with right heart failure (HCC) 04/17/2017   Chronic respiratory failure with hypoxia (HCC) 12/23/2018   Cough variant asthma 04/13/2019   Depression    GERD (gastroesophageal reflux disease)    takes Dexilant and carafate and gi coctail    Headache    migraines on a daily and monthly regimen    Heart murmur    History of kidney stones    Hodgkin lymphoma (HCC)    STATUS POST MANTLE RADIATION   Hodgkin's lymphoma (HCC)    1987   Hypertension    Hypoxia    Necrotizing fasciitis (HCC) 12/23/2018   Non-ischemic cardiomyopathy (HCC)    Osteoporosis    Palpitations    Pituitary adenoma (HCC) 12/23/2018   Pneumonia    PONV (postoperative nausea and vomiting)    Pre-diabetes    per pt; no meds   Pulmonary hypertension (HCC) 12/23/2018   Raynaud phenomenon    Right heart failure (HCC) 04/17/2017   Seizures (HCC)    last febrile seizure was approx 3 weeks ago per report on 12/01/2020   Sleep apnea    upcoming sleep study per pt    Supplemental oxygen dependent    3 liters   SVT (supraventricular tachycardia) (HCC)    Tachycardia    Thyroid cancer (HCC)    STATUS POST SURGICAL REMOVAL-CURRENT ON THYROID REPLACEMENT    Family History  Family history unknown: Yes    Social History   Socioeconomic History   Marital status: Married    Spouse name: Not on file   Number of children: Not on file   Years of education: Not on file   Highest education level: Not on file  Occupational History   Not on file  Tobacco Use   Smoking status: Never   Smokeless tobacco: Never  Vaping Use   Vaping status: Never Used  Substance and Sexual Activity   Alcohol use: Not Currently    Comment: social    Drug use: No   Sexual activity: Not Currently    Birth control/protection: Surgical  Other Topics Concern   Not on file  Social History Narrative   Not on file   Social Drivers of Health   Financial Resource  Strain: Medium Risk (08/21/2017)   Received from [facility], [facility]   Overall Financial Resource Strain (CARDIA)    Difficulty of Paying Living Expenses: Somewhat hard  Food Insecurity: No Food Insecurity (04/23/2023)   Hunger Vital Sign    Worried About Running Out of Food in the Last Year: Never true    Ran Out of Food in the Last Year: Never true  Transportation Needs: No Transportation Needs (04/23/2023)   PRAPARE - Administrator, Civil Service (Medical): No    Lack of Transportation (Non-Medical): No  Physical Activity: Unknown (08/21/2017)   Received from [facility], [facility]   Exercise Vital Sign    Days of Exercise per Week: 0 days    Minutes of Exercise per Session: Not on file  Stress: Stress Concern Present (08/21/2017)   Received from [facility], [facility]   [facility] - Occupational Stress Questionnaire    Feeling of Stress : Very much  Social Connections: Unknown (02/12/2022)   Received from [facility], [facility]   Social Network    Social Network: Not on file  Intimate Partner Violence: Not At Risk (04/23/2023)   Humiliation, Afraid, Rape, and Kick questionnaire    Fear of Current or Ex-Partner: No    Emotionally Abused: No    Physically Abused: No    Sexually Abused: No    Past Medical History, Surgical history, Social history, and Family history were reviewed and updated as appropriate.   Please see review of systems for further details on the patient's review from today.   Objective:   Physical Exam:  LMP  (LMP Unknown)   Physical Exam Constitutional:      General: She is not in acute distress. Musculoskeletal:        General: No deformity.  Neurological:     Mental Status: She is alert and oriented to person, place, and time.     Coordination: Coordination normal.  Psychiatric:         Attention and Perception: Attention and perception normal. She does not perceive auditory or visual hallucinations.        Mood and Affect: Affect is not labile, blunt, angry or inappropriate.        Speech: Speech normal.        Behavior: Behavior normal.        Thought Content: Thought content normal. Thought content is not paranoid or delusional. Thought content does not include homicidal or suicidal ideation. Thought content does not include homicidal or suicidal plan.        Cognition and Memory: Cognition and memory normal.        Judgment: Judgment normal.     Comments: Insight intact     Lab Review:     Component Value Date/Time   NA 141 09/03/2023 1315   NA 140 08/20/2022 1513   K 3.2 (L) 09/03/2023 1315   CL 105 09/03/2023 1315   CO2 26 09/03/2023 1315   GLUCOSE 170 (H) 09/03/2023 1315   BUN 28 (H) 09/03/2023 1315   BUN 28 (H) 08/20/2022 1513   CREATININE 1.45 (H) 09/03/2023 1315   CREATININE 1.08 (H) 02/09/2022 1202   CALCIUM 8.4 (L) 09/03/2023 1315   PROT 6.8 09/03/2023 1315   PROT 7.1 01/23/2018 1225   ALBUMIN 3.8 09/03/2023 1315   ALBUMIN 4.4 01/23/2018 1225   AST 18 09/03/2023 1315   AST 26 02/09/2022 1202   ALT 25 09/03/2023 1315   ALT 40 02/09/2022 1202   ALKPHOS 56 09/03/2023 1315   BILITOT 0.3 09/03/2023 1315   BILITOT 0.4 02/09/2022 1202   GFRNONAA 43 (L) 09/03/2023 1315   GFRNONAA >60 02/09/2022 1202   GFRAA >60 04/27/2020 0613   GFRAA >60 02/10/2020 1020       Component Value Date/Time   WBC 13.6 (H) 09/03/2023 1315   RBC 3.84 (L) 09/03/2023 1315   HGB 9.6 (L) 09/03/2023 1315   HGB 10.6 (L) 08/20/2022 1513   HCT 31.1 (L) 09/03/2023 1315   HCT 34.4 08/20/2022 1513   PLT 332 09/03/2023 1315   PLT 414 08/20/2022 1513   MCV 81.0 09/03/2023 1315   MCV 78 (L) 08/20/2022 1513   MCH 25.0 (L) 09/03/2023 1315   MCHC 30.9 09/03/2023 1315   RDW 18.4 (H) 09/03/2023 1315   RDW 16.5 (H) 08/20/2022 1513   LYMPHSABS 2.2 09/03/2023 1315   MONOABS 1.0  09/03/2023 1315   EOSABS 0.1 09/03/2023 1315   BASOSABS 0.1 09/03/2023 1315    No results found for: "  POCLITH", "LITHIUM"   No results found for: "PHENYTOIN", "PHENOBARB", "VALPROATE", "CBMZ"   .res Assessment: Plan:    Plan:  PDMP reviewed  Clonazepam 1mg  - twice daily. Xanax 1mg  daily and one extra as needed for extreme anxiety up to10 days a month.  Lexapro 20mg  daily Wellbutrin XL 150mg  every morning - did not tolerate increase in dose - reports seizure history  Lamictal 200mg  daily Lamictal 50mg  every morning  Olanzapine 5mg  at hs   Seeing [name] therapy visits.  Discussed potential benefits, risk, and side effects of benzodiazepines to include potential risk of tolerance and dependence, as well as possible drowsiness.  Advised patient not to drive if experiencing drowsiness and to take lowest possible effective dose to minimize risk of dependence and tolerance.   Counseled patient regarding potential benefits, risks, and side effects of Lamictal to include potential risk of Stevens-Johnson syndrome. Advised patient to stop taking Lamictal and contact office immediately if rash develops and to seek urgent medical attention if rash is severe and/or spreading quickly.  Discussed potential metabolic side effects associated with atypical antipsychotics, as well as potential risk for movement side effects. Advised pt to contact office if movement side effects occur.    RTC 4 weeks  Patient advised to contact office with any questions, adverse effects, or acute worsening in signs and symptoms.  There are no diagnoses linked to this encounter.   Please see After Visit Summary for patient specific instructions.  Future Appointments  Date Time Provider Department Center  09/20/2023  9:00 AM Mathis Fare, LCSW [facility] None  09/24/2023 11:15 AM [address] [facility] None  10/17/2023 10:00 AM Mathis Fare, LCSW [facility] None    No orders of the defined types were  placed in this encounter.     -------------------------------

## 2023-09-20 ENCOUNTER — Ambulatory Visit: Payer: BC Managed Care – PPO | Admitting: Psychiatry

## 2023-09-20 DIAGNOSIS — F411 Generalized anxiety disorder: Secondary | ICD-10-CM

## 2023-09-20 NOTE — Progress Notes (Signed)
[facility] Counselor/Therapist Progress Note  Patient ID: [name], MRN: [id number],    Date: 09/20/2023  Time Spent: 50 minutes  Treatment Type: Individual Therapy  Virtual Visit via Telehealth Note: MyChart Video session: Connected with patient by a telemedicine/telehealth application, with their informed consent, and verified patient privacy and that I am speaking with the correct person using two identifiers. I discussed the limitations, risks, security and privacy concerns of performing psychotherapy and the availability of in person appointments. I also discussed with the patient that there may be a patient responsible charge related to this service. The patient expressed understanding and agreed to proceed. I discussed the treatment planning with the patient. The patient was provided an opportunity to ask questions and all were answered. The patient agreed with the plan and demonstrated an understanding of the instructions. The patient was advised to call  our office if  symptoms worsen or feel they are in a crisis state and need immediate contact.   Therapist Location: office Patient Location: home   Reported Symptoms: anxiety, depression, anger, irritable   Mental Status Exam:  Appearance:   Casual     Behavior:  Appropriate, Sharing, and Motivated  Motor:  Affected some by her illness  Speech/Language:   Clear and Coherent  Affect:  Depressed and anxious  Mood:  anxious and depressed  Thought process:  goal directed  Thought content:    Rumination  Sensory/Perceptual disturbances:    WNL  Orientation:  oriented to person, place, time/date, situation, day of week, month of year, year, and stated date of Dec. 20, 2024  Attention:  Good  Concentration:  Good  Memory:  "Not as good due to illness and some medications"  Fund of knowledge:   Fair  Insight:    Good and Fair  Judgment:   Good and Fair  Impulse Control:  Good and Fair   Risk Assessment: Danger  to Self:  No Self-injurious Behavior: No Danger to Others: No Duty to Warn:no Physical Aggression / Violence:No  Access to Firearms a concern: No  Gang Involvement:No   Subjective:   Patient today reporting anxiety and depression, some anger and irritability. Anxiety and depression mostly related to her illness and some ongoing family issues, including one older daughter that does not speak to patient. Processed this with patient today as she clearly had feelings to express and issues about this relationship (or lack of) to discuss. Also some concerns shared re: 51 yr old son.  Patient especially needed to just event her feelings and frustrations, sadness, and fears regarding her journey with cancer and its uncertainties.  Openly expressing her sadness, fears, anxieties, as well as concerns for some of her family, and seem to be feeling less overwhelmed and heavy by end of session.  This was probably what patient needed the most today.  Appreciated being able to share and talk through her various emotions at this time and acknowledged how being heard and receiving positive support is helpful.  Continues to receive palliative care services at this time.  Interventions: Cognitive Behavioral Therapy, Ego-Supportive, and Grief Therapy  Long-term goal:  Patient will confront her anxiety, fears, anger reported re: her illness and use time in therapy and beyond to work on this, while receiving feedback and support from therapist. Help her determine what she feels is realistic for her considering her health challenges and difficult prognosis.  Short-term goal: Participate in therapy and initiate communication of needs and desires at this  point in her struggle physically.  Strategies: Share and talk through anxiety, anger, and depression that are experienced and work to find and use helpful coping tools to better manage those feelings.  Diagnosis:   ICD-10-CM   1. Generalized anxiety disorder  F41.1       Plan: Patient today participated actively in session continuing to wrestle with the symptoms of sadness, anxiety, depression, along with some family issues and concerns.  I noticed that she was able to talk about some very sensitive things today and yet rebounded to also talk about some of what she is grateful for and this whole process of her illness and so many uncertainties.  She has made progress and needs to continue her work with goal-directed behaviors to keep moving in a more forward and positive direction.  Goal review and progress/challenges noted with patient.  Next appointment within 3 to 4 weeks.   Mathis Fare, LCSW

## 2023-09-23 ENCOUNTER — Other Ambulatory Visit: Payer: Self-pay | Admitting: Cardiovascular Disease

## 2023-09-24 ENCOUNTER — Ambulatory Visit (INDEPENDENT_AMBULATORY_CARE_PROVIDER_SITE_OTHER): Payer: BC Managed Care – PPO

## 2023-09-29 ENCOUNTER — Other Ambulatory Visit (HOSPITAL_BASED_OUTPATIENT_CLINIC_OR_DEPARTMENT_OTHER): Payer: Self-pay | Admitting: Pulmonary Disease

## 2023-09-30 ENCOUNTER — Other Ambulatory Visit: Payer: Self-pay

## 2023-09-30 ENCOUNTER — Other Ambulatory Visit (HOSPITAL_COMMUNITY): Payer: Self-pay

## 2023-09-30 MED ORDER — HEPARIN NA (PORK) LOCK FLSH PF 100 UNIT/ML IV SOLN
INTRAVENOUS | 3 refills | Status: DC
  Filled 2023-09-30: qty 150, 30d supply, fill #0
  Filled 2023-10-30: qty 150, 30d supply, fill #1
  Filled 2023-11-26: qty 150, 30d supply, fill #2
  Filled 2023-12-12: qty 50, 10d supply, fill #3
  Filled 2023-12-23: qty 100, 20d supply, fill #4

## 2023-10-03 ENCOUNTER — Other Ambulatory Visit (HOSPITAL_COMMUNITY): Payer: Self-pay

## 2023-10-03 ENCOUNTER — Ambulatory Visit: Payer: BC Managed Care – PPO | Admitting: Psychiatry

## 2023-10-04 DIAGNOSIS — G8222 Paraplegia, incomplete: Secondary | ICD-10-CM | POA: Diagnosis not present

## 2023-10-04 DIAGNOSIS — R911 Solitary pulmonary nodule: Secondary | ICD-10-CM | POA: Diagnosis not present

## 2023-10-05 ENCOUNTER — Other Ambulatory Visit: Payer: Self-pay | Admitting: Cardiovascular Disease

## 2023-10-07 ENCOUNTER — Other Ambulatory Visit (HOSPITAL_COMMUNITY): Payer: Self-pay

## 2023-10-08 ENCOUNTER — Telehealth (HOSPITAL_COMMUNITY): Payer: Self-pay

## 2023-10-08 NOTE — Telephone Encounter (Signed)
 Pt called to get scheduled for a tunneled catheter eval. I blocked the schedule for Thursday. I will call Dr. Willeen Cass office to see if they can fax an order so that I can put her on. AB

## 2023-10-09 ENCOUNTER — Other Ambulatory Visit (HOSPITAL_COMMUNITY): Payer: Self-pay | Admitting: Endocrinology

## 2023-10-09 DIAGNOSIS — F445 Conversion disorder with seizures or convulsions: Secondary | ICD-10-CM | POA: Diagnosis not present

## 2023-10-09 DIAGNOSIS — I878 Other specified disorders of veins: Secondary | ICD-10-CM

## 2023-10-10 ENCOUNTER — Ambulatory Visit (HOSPITAL_COMMUNITY)
Admission: RE | Admit: 2023-10-10 | Discharge: 2023-10-10 | Disposition: A | Discharge status: Home / Self Care | Payer: BC Managed Care – PPO | Source: Ambulatory Visit | Attending: Endocrinology | Admitting: Endocrinology

## 2023-10-10 ENCOUNTER — Ambulatory Visit (INDEPENDENT_AMBULATORY_CARE_PROVIDER_SITE_OTHER): Payer: BC Managed Care – PPO

## 2023-10-10 ENCOUNTER — Other Ambulatory Visit: Payer: Self-pay | Admitting: Adult Health

## 2023-10-10 ENCOUNTER — Other Ambulatory Visit (HOSPITAL_BASED_OUTPATIENT_CLINIC_OR_DEPARTMENT_OTHER): Payer: Self-pay | Admitting: Pulmonary Disease

## 2023-10-10 ENCOUNTER — Telehealth: Payer: Self-pay | Admitting: Adult Health

## 2023-10-10 ENCOUNTER — Other Ambulatory Visit (HOSPITAL_COMMUNITY): Payer: Self-pay

## 2023-10-10 DIAGNOSIS — T8249XA Other complication of vascular dialysis catheter, initial encounter: Secondary | ICD-10-CM | POA: Diagnosis not present

## 2023-10-10 DIAGNOSIS — J209 Acute bronchitis, unspecified: Secondary | ICD-10-CM

## 2023-10-10 DIAGNOSIS — Z452 Encounter for adjustment and management of vascular access device: Secondary | ICD-10-CM | POA: Diagnosis not present

## 2023-10-10 DIAGNOSIS — M319 Necrotizing vasculopathy, unspecified: Secondary | ICD-10-CM | POA: Diagnosis not present

## 2023-10-10 DIAGNOSIS — E271 Primary adrenocortical insufficiency: Secondary | ICD-10-CM | POA: Diagnosis not present

## 2023-10-10 DIAGNOSIS — I878 Other specified disorders of veins: Secondary | ICD-10-CM

## 2023-10-10 HISTORY — PX: IR FLUORO GUIDE CV LINE RIGHT: IMG2283

## 2023-10-10 MED ORDER — CHLORHEXIDINE GLUCONATE 4 % EX SOLN
CUTANEOUS | Status: AC
  Filled 2023-10-10: qty 15

## 2023-10-10 MED ORDER — LIDOCAINE-EPINEPHRINE 1 %-1:100000 IJ SOLN
INTRAMUSCULAR | Status: AC
  Filled 2023-10-10: qty 1

## 2023-10-10 MED ORDER — HEPARIN SOD (PORK) LOCK FLUSH 100 UNIT/ML IV SOLN
INTRAVENOUS | Status: AC
  Filled 2023-10-10: qty 5

## 2023-10-10 MED ORDER — ALBUTEROL SULFATE (2.5 MG/3ML) 0.083% IN NEBU
2.5000 mg | INHALATION_SOLUTION | Freq: Four times a day (QID) | RESPIRATORY_TRACT | 2 refills | Status: DC | PRN
  Filled 2023-10-12: qty 360, 30d supply, fill #0

## 2023-10-10 NOTE — Telephone Encounter (Signed)
 Talked with another pharmacist and let them know she is hospice and they will fill today.

## 2023-10-10 NOTE — Telephone Encounter (Signed)
 Pt left message that pharmacy will not fill her Xanax . Last fill date per [place]MPD is 12/12. Pharmacist said it can be filled tomorrow, except he said she is also on clonazepam , which she just filled 1/4, and he is not comfortable with her being on both, as it increases her addiction risk,  and he will not fill the alprazolam .  Please advise.

## 2023-10-10 NOTE — Telephone Encounter (Signed)
 Pt called and said that the pharmacy called her and said that they will not fill her xanax without a phone call from [place]. Please call the pharmacy and find out what they need. She is down to one pill

## 2023-10-12 ENCOUNTER — Other Ambulatory Visit (HOSPITAL_COMMUNITY): Payer: Self-pay

## 2023-10-15 ENCOUNTER — Other Ambulatory Visit: Payer: Self-pay | Admitting: Cardiovascular Disease

## 2023-10-17 ENCOUNTER — Ambulatory Visit: Payer: Medicare Other | Admitting: Psychiatry

## 2023-10-17 NOTE — Progress Notes (Unsigned)
      [facility] Counselor/Therapist Progress Note  Patient ID: [name], MRN: [id number],    Date: 10/17/2023  Time Spent: ***   Treatment Type: {CHL AMB THERAPY TYPES:[id number]}  Reported Symptoms: ***  Mental Status Exam:  Appearance:   {PSY:22683}     Behavior:  {PSY:21022743}  Motor:  {PSY:22302}  Speech/Language:   {PSY:22685}  Affect:  {PSY:22687}  Mood:  {PSY:31886}  Thought process:  {PSY:31888}  Thought content:    {PSY:[phone]}  Sensory/Perceptual disturbances:    {PSY:304-045-6904}  Orientation:  {PSY:30297}  Attention:  {PSY:22877}  Concentration:  {[phone]}  Memory:  {PSY:801 621 3076}  Fund of knowledge:   {[phone]}  Insight:    {[phone]}  Judgment:   {[phone]}  Impulse Control:  {[phone]}   Risk Assessment: Danger to Self:  {PSY:22692} Self-injurious Behavior: {PSY:22692} Danger to Others: {PSY:22692} Duty to Warn:{PSY:311194} Physical Aggression / Violence:{PSY:21197} Access to Firearms a concern: {PSY:21197} Gang Involvement:{PSY:21197}  Subjective: ***   Interventions: {PSY:[phone]}  Diagnosis:No diagnosis found.  Plan: ***  Mathis Fare, LCSW

## 2023-10-18 ENCOUNTER — Ambulatory Visit (INDEPENDENT_AMBULATORY_CARE_PROVIDER_SITE_OTHER): Payer: BC Managed Care – PPO | Admitting: Psychiatry

## 2023-10-18 DIAGNOSIS — F331 Major depressive disorder, recurrent, moderate: Secondary | ICD-10-CM

## 2023-10-18 DIAGNOSIS — F411 Generalized anxiety disorder: Secondary | ICD-10-CM

## 2023-10-18 NOTE — Progress Notes (Signed)
[facility] Counselor/Therapist Progress Note  Patient ID: [name], MRN: [id number],    Date: 10/18/2023  Time Spent:  50 minutes  Treatment Type: Individual Therapy  Virtual Visit via Telehealth Note:  (Started out with video note as planned but had system technical issues and had to convert to audio telehealth session) Connected with patient by a telemedicine/telehealth application, with their informed consent, and verified patient privacy and that I am speaking with the correct person using two identifiers. I discussed the limitations, risks, security and privacy concerns of performing psychotherapy and the availability of in person appointments. I also discussed with the patient that there may be a patient responsible charge related to this service. The patient expressed understanding and agreed to proceed. I discussed the treatment planning with the patient. The patient was provided an opportunity to ask questions and all were answered. The patient agreed with the plan and demonstrated an understanding of the instructions. The patient was advised to call  our office if  symptoms worsen or feel they are in a crisis state and need immediate contact.   Therapist Location: office Patient Location: home  Reported Symptoms: depression, anxiety, anger, hard to be hopeful   Mental Status Exam:  Appearance:   Casual     Behavior:  Appropriate, Sharing, and Motivated  Motor:  Affected by her illness  Speech/Language:   Clear and Coherent  Affect:  N/a as the session had to turn to audio  vs video due to technical issues with system  Mood:  anxious, depressed, and "indifferent at times"  Thought process:  goal directed  Thought content:    WNL  Sensory/Perceptual disturbances:    WNL  Orientation:  oriented to person, place, time/date, situation, day of week, month of year, year, and stated date of October 18, 2023  Attention:  Good  Concentration:  Good and Fair  Memory:  WNL  but later reported "some memory issues especially with short term memory"  Fund of knowledge:   Good and Fair  Insight:    Good and Fair  Judgment:   Good  Impulse Control:  Good and Fair   Risk Assessment: Danger to Self:  No Self-injurious Behavior: No Danger to Others: No Duty to Warn:no Physical Aggression / Violence:No  Access to Firearms a concern: No  Gang Involvement:No   Subjective:  Patient today working on her sadness and some anger about her current medical situation and dealing with her cancer.  Elderly mom fell last week and broke her wrist. Expressing ongoing anger with patient's sister. Focused more on her own depression and anxiety. Denies any suicidal ideation or plans. Talks through multiple family concerns and her own anger and frustration at length and took most of session today which was really needed by patient.  Seems better with some of the issues regarding 76 year old son that we discussed last session.  Continues to be very open and expressing her thoughts and emotions and helps her to feel more calm and grounded by the end of her session.  States that she appreciates the positive support she feels and the ability and opportunity to vent so freely.  Looked at some thought patterns that can help her manage some of her concerns and ways that might help alleviate stress particularly emotional stress.  Reports she continues to receive her in-home care at this time.   Interventions: Cognitive Behavioral Therapy and Ego-Supportive  Long-term goal:  Patient will confront her anxiety, fears, anger reported  re: her illness and use time in therapy and beyond to work on this, while receiving feedback and support from therapist. Help her determine what she feels is realistic for her considering her health challenges and difficult prognosis.  Short-term goal: Participate in therapy and initiate communication of needs and desires at this point in her struggle physically.   Strategies: Share and talk through anxiety, anger, and depression that are experienced and work to find and use helpful coping tools to better manage those feelings.  Diagnosis:   ICD-10-CM   1. Major depressive disorder, recurrent episode, moderate (HCC)  F33.1      Plan: Patient today showing good energy and motivation as she worked on issues regarding her anger, sadness, and some family issues as she continues living with cancer and getting in-home services to help her in managing her medical needs.  Participated well in session and able to talk further through some of her anger, resentment, and depression which she stated was helpful towards end of session.  Needs to continue her work with goal-directed behaviors so as to receive the care and attention she needs in her life at this time in trying to manage her emotional and health challenges.  Review of her treatment goals and her work together.  Next appointment within 2 to 3 weeks.   Mathis Fare, LCSW

## 2023-10-22 ENCOUNTER — Telehealth: Payer: Self-pay | Admitting: Adult Health

## 2023-10-22 NOTE — Progress Notes (Signed)
Unable to have patient session so it was canceled.

## 2023-10-22 NOTE — Telephone Encounter (Signed)
Pt called and said that she needs a refill on her xanax.  Pharmacy is [facility] on [address]

## 2023-10-22 NOTE — Telephone Encounter (Signed)
LF 10/10/2023   Alprazolam 1 Mg Tablet 30.00 30   DUE 2/6

## 2023-10-24 ENCOUNTER — Ambulatory Visit (INDEPENDENT_AMBULATORY_CARE_PROVIDER_SITE_OTHER): Payer: BC Managed Care – PPO

## 2023-10-25 ENCOUNTER — Telehealth (INDEPENDENT_AMBULATORY_CARE_PROVIDER_SITE_OTHER): Payer: Self-pay | Admitting: Otolaryngology

## 2023-10-25 NOTE — Telephone Encounter (Signed)
Reminder Call: Date: 10/28/2023 Status: Sch  Time: 9:15 AM [address] [place], [place] [address] Confirmed time and location w/patient.

## 2023-10-28 ENCOUNTER — Other Ambulatory Visit (HOSPITAL_COMMUNITY): Payer: Self-pay

## 2023-10-28 ENCOUNTER — Ambulatory Visit (INDEPENDENT_AMBULATORY_CARE_PROVIDER_SITE_OTHER): Payer: BC Managed Care – PPO | Admitting: Otolaryngology

## 2023-10-28 ENCOUNTER — Other Ambulatory Visit (INDEPENDENT_AMBULATORY_CARE_PROVIDER_SITE_OTHER): Payer: Self-pay | Admitting: Otolaryngology

## 2023-10-28 ENCOUNTER — Encounter (INDEPENDENT_AMBULATORY_CARE_PROVIDER_SITE_OTHER): Payer: Self-pay

## 2023-10-28 VITALS — BP 127/74 | HR 89 | Resp 19 | Wt 164.0 lb

## 2023-10-28 DIAGNOSIS — R490 Dysphonia: Secondary | ICD-10-CM

## 2023-10-28 DIAGNOSIS — R09A2 Foreign body sensation, throat: Secondary | ICD-10-CM | POA: Diagnosis not present

## 2023-10-28 DIAGNOSIS — K219 Gastro-esophageal reflux disease without esophagitis: Secondary | ICD-10-CM | POA: Diagnosis not present

## 2023-10-28 DIAGNOSIS — R1310 Dysphagia, unspecified: Secondary | ICD-10-CM

## 2023-10-28 MED ORDER — PANTOPRAZOLE SODIUM 40 MG PO TBEC: 40.0000 mg | DELAYED_RELEASE_TABLET | Freq: Every day | ORAL | 3 refills | Status: DC

## 2023-10-28 MED ORDER — FAMOTIDINE 40 MG PO TABS: 40.0000 mg | ORAL_TABLET | Freq: Every day | ORAL | 3 refills | Status: DC

## 2023-10-28 NOTE — Progress Notes (Signed)
Dear Dr. Syble Creek, Here is my assessment for our mutual patient, [name]. Thank you for allowing me the opportunity to care for your patient. Please do not hesitate to contact me should you have any other questions. Sincerely, Dr. Jovita Kussmaul  Otolaryngology Clinic Note Referring provider: Dr. Syble Creek HPI:  [name] is a 53 y.o. female kindly referred by Dr. Syble Creek for evaluation of foreign body sensation in throat. Patient was in the ER for an urgent evaluation and referred here.   Initial visit (07/2023): Patient reports that she has had some difficulty swallowing - maybe started 10 days ago. She reports that this started when she swallowed something (unclear what - maybe a pill), and it got stuck and then had trouble swallowing since then. She points to her clavicle and it is painful.  She is quite concerned and wishes to rule out a foreign body.  She does not think she has choked on any food. Feels like something got wedged, and then now feels like she can't eat. She reports that she is able to drink but having difficulty with solid foods. She does report having a cough, and coughing spell after drinking sometimes. Intermittent dysphagia  She denies fevers, but reports some epigastric burning. She has been to ED twice and was prescribed famotidine and pantoprazole. No significant weight loss. She does have some vocal breaks but reports this has been ongoing since past couple of years.  She was diagnosed with COVID - 2 weeks ago. She reports she has not had significant symptoms from this but may be related. She has previously seen Dr. Delford Field for aspiration and voice loss, and she had a normal oropharyngeal swallowing on FEES. Prior saw Dr. Delford Field in 2016 for VF nodule and globus and swallowing.  --------------------------------------------------------- 10/28/2023 She reports continued reflux trouble and globus sensation. Still issues with solid foods. She is on a PPI and H2. We tried  to prescribe dexilant since it worked for her prior, but insurance did not authorize. Interimttent dysphonia. She has not seen GI for a few years it appears. She reports some reflux especially at night. We discussed her MBS results which are reassuring. Some nasal congestion and dry nose, but continues to be on O2. ------------------------------------------------------------------- She has chronic pulmonary respiratory failure (5L O3 at home), CHF, CKD.Marland Kitchen  Personal or FHx of bleeding dz or anesthesia difficulty: no  PMHx: Thyroid carcinoma - 2010 s/p total thyroidectomy, Addison's disease, Hogkin's, Breast cancer, cervical cancer, ADHD, Anxiety/Depression  Independent Review of Additional Tests or Records:  Prior WBC: 11.7 CXR 10/17: b/l interstitial opacity, no consolidation; no foreign bodies noted CT CAP 12/2022 reviewed independently: small pulm nodules (subcentimeter) Prior notes and FEES report reviewed from Dr .Delford Field [facility]):  She has previously seen Dr. Delford Field for aspiration and voice loss, and she had a normal oropharyngeal swallowing on FEES. Prior saw Dr. Delford Field in 2016 for VF nodule and globus and swallowing.  MBS (09/16/2023): independently interpreted and reviewed; no aspiration or penetration, generally normal OP swallow; no significant CP bar. Query slight small esophageal web(?), but unlikely.  PMH/Meds/All/SocHx/FamHx/ROS:   Past Medical History:  Diagnosis Date   Addison's disease (HCC)    Adrenal insufficiency (HCC)    Anemia    Anxiety    Aortic stenosis    Aortic stenosis    Appendicitis 12/19/09   Appendicitis    Breast cancer (HCC)    STATUS POST BILATERAL MASTECTOMY. STATUS POST RECONSTRUCTION. SHE HAD SILICONE BREAST IMPLANTS AND THE LEFT IMPLANT  IS LEAKING SLIGHTLY   Cellulitis of right middle finger 11/07/2018   Cervical cancer (HCC) 12/23/2018   Chest pain    CHF with right heart failure (HCC) 04/17/2017   Chronic respiratory failure with hypoxia (HCC)  12/23/2018   Cough variant asthma 04/13/2019   Depression    GERD (gastroesophageal reflux disease)    takes Dexilant and carafate and gi coctail    Headache    migraines on a daily and monthly regimen    Heart murmur    History of kidney stones    Hodgkin lymphoma (HCC)    STATUS POST MANTLE RADIATION   Hodgkin's lymphoma (HCC)    1987   Hypertension    Hypoxia    Necrotizing fasciitis (HCC) 12/23/2018   Non-ischemic cardiomyopathy (HCC)    Osteoporosis    Palpitations    Pituitary adenoma (HCC) 12/23/2018   Pneumonia    PONV (postoperative nausea and vomiting)    Pre-diabetes    per pt; no meds   Pulmonary hypertension (HCC) 12/23/2018   Raynaud phenomenon    Right heart failure (HCC) 04/17/2017   Seizures (HCC)    last febrile seizure was approx 3 weeks ago per report on 12/01/2020   Sleep apnea    upcoming sleep study per pt    Supplemental oxygen dependent    3 liters   SVT (supraventricular tachycardia) (HCC)    Tachycardia    Thyroid cancer (HCC)    STATUS POST SURGICAL REMOVAL-CURRENT ON THYROID REPLACEMENT     Past Surgical History:  Procedure Laterality Date   ABDOMINAL HYSTERECTOMY     AMPUTATION Left 01/30/2019   Procedure: Left Index finger amputation with flap reconstruction and repair reconstruction;  Surgeon: Dominica Severin, MD;  Location: [facility] OR;  Service: Orthopedics;  Laterality: Left;   APPENDECTOMY     breast implants and removal      breast implants but leaking      CARDIAC CATHETERIZATION  05/18/09   NORMAL CATH   COLONOSCOPY     hx of chemotherapy      hx of radiation therapy      I & D EXTREMITY Left 12/23/2018   Procedure: IRRIGATION AND DEBRIDEMENT HAND / INDEX FINGER;  Surgeon: Dominica Severin, MD;  Location: [facility] OR;  Service: Orthopedics;  Laterality: Left;   IR CV LINE INJECTION  03/22/2022   IR FLUORO GUIDE CV LINE RIGHT  10/09/2022   IR FLUORO GUIDE CV LINE RIGHT  01/18/2023   IR FLUORO GUIDE CV LINE RIGHT  03/26/2023   IR FLUORO GUIDE CV LINE  RIGHT  10/10/2023   IR IMAGING GUIDED PORT INSERTION  05/06/2020   IR IMAGING GUIDED PORT INSERTION  12/04/2021   IR REMOVAL TUN ACCESS W/ PORT W/O FL MOD SED  04/27/2020   IR REMOVAL TUN ACCESS W/ PORT W/O FL MOD SED  12/04/2021   IR REMOVAL TUN ACCESS W/ PORT W/O FL MOD SED  03/30/2022   IR US GUIDE VASC ACCESS RIGHT  10/09/2022   KIDNEY STONE SURGERY     LUMBAR PUNCTURE W/ INTRATHECAL CHEMOTHERAPY     MASTECTOMY     PITUITARY SURGERY     RIGHT/LEFT HEART CATH AND CORONARY ANGIOGRAPHY N/A 04/02/2018   Procedure: RIGHT/LEFT HEART CATH AND CORONARY ANGIOGRAPHY;  Surgeon: Kathleene Hazel, MD;  Location: [facility] INVASIVE CV LAB;  Service: Cardiovascular;  Laterality: N/A;   RIGHT/LEFT HEART CATH AND CORONARY ANGIOGRAPHY N/A 08/31/2022   Procedure: RIGHT/LEFT HEART CATH AND CORONARY ANGIOGRAPHY;  Surgeon: Gala Romney,  Bevelyn Buckles, MD;  Location: [facility] INVASIVE CV LAB;  Service: Cardiovascular;  Laterality: N/A;   TOOTH EXTRACTION N/A 12/05/2020   Procedure: DENTAL RESTORATION/EXTRACTIONS;  Surgeon: Lovena Neighbours, MD;  Location: [facility] ORS;  Service: Oral Surgery;  Laterality: N/A;   TOTAL THYROIDECTOMY     VIDEO BRONCHOSCOPY Bilateral 11/14/2018   Procedure: VIDEO BRONCHOSCOPY WITHOUT FLUORO;  Surgeon: Luciano Cutter, MD;  Location: [facility] ENDOSCOPY;  Service: Cardiopulmonary;  Laterality: Bilateral;   VIDEO BRONCHOSCOPY WITH ENDOBRONCHIAL ULTRASOUND N/A 11/19/2018   Procedure: VIDEO BRONCHOSCOPY WITH ENDOBRONCHIAL ULTRASOUND;  Surgeon: Luciano Cutter, MD;  Location: [facility] OR;  Service: Thoracic;  Laterality: N/A;    Family History  Family history unknown: Yes     Social Connections: Unknown (02/12/2022)   Received from [facility]   Social Network    Social Network: Not on file       Current Outpatient Medications:    albuterol (PROVENTIL) (2.5 MG/3ML) 0.083% nebulizer solution, Take 3 mLs (2.5 mg total) by nebulization every 6 (six) hours as needed for wheezing or shortness of  breath., Disp: 360 mL, Rfl: 2   ALPRAZolam (XANAX) 1 MG tablet, 1 tablet daily and 1 tablet PRN no more than 10 days a month., Disp: 40 tablet, Rfl: 0   aspirin EC 81 MG tablet, Take 1 tablet (81 mg total) by mouth daily. Swallow whole. (Patient taking differently: Take 81 mg by mouth at bedtime. Chewable), Disp: 90 tablet, Rfl: 3   Bismuth Tribromoph-Petrolatum (XEROFORM OCCLUSIVE GAUZE STRIP) PADS, Apply 1 each topically as directed., Disp: 50 each, Rfl: 1   bisoprolol (ZEBETA) 5 MG tablet, TAKE 1/2 TABLET BY MOUTH DAILY, Disp: 8 tablet, Rfl: 0   bumetanide (BUMEX) 1 MG tablet, TAKE 1 TABLET BY MOUTH TWICE A DAY, Disp: 60 tablet, Rfl: 0   buPROPion (WELLBUTRIN XL) 150 MG 24 hr tablet, Take 1 tablet (150 mg total) by mouth daily., Disp: 90 tablet, Rfl: 0   clonazePAM (KLONOPIN) 1 MG tablet, Take 1 tablet (1 mg total) by mouth 2 (two) times daily., Disp: 60 tablet, Rfl: 2   cyclobenzaprine (FLEXERIL) 5 MG tablet, Take 1 tablet (5 mg total) by mouth 3 (three) times daily as needed for muscle spasms., Disp: 20 tablet, Rfl: 0   diclofenac (VOLTAREN) 50 MG EC tablet, Take 50 mg by mouth 2 (two) times daily. *as needed for migraine, Can repeat if needed*, Disp: , Rfl:    diphenhydrAMINE (BENADRYL) 12.5 MG/5ML elixir, Take 10 mLs (25 mg total) by mouth every 6 (six) hours as needed (nausea)., Disp: 120 mL, Rfl: 0   doxycycline (VIBRA-TABS) 100 MG tablet, Take 1 tablet (100 mg total) by mouth 2 (two) times daily., Disp: 14 tablet, Rfl: 0   EMGALITY 120 MG/ML SOSY, Inject 120 mg into the skin every 28 (twenty-eight) days., Disp: , Rfl:    EPINEPHRINE 0.3 mg/0.3 mL IJ SOAJ injection, INJECT 0.3 MG INTO THE MUSCLE AS NEEDED FOR ANAPHYLAXIS., Disp: 2 each, Rfl: 1   escitalopram (LEXAPRO) 20 MG tablet, Take 1 tablet (20 mg total) by mouth every evening., Disp: 30 tablet, Rfl: 5   famotidine (PEPCID) 40 MG/5ML suspension, Take 1.3 mLs (10.4 mg total) by mouth daily., Disp: 50 mL, Rfl: 0   FLORASTOR 250 MG  capsule, Take 250 mg by mouth 2 (two) times daily. Mid Morning and Mid Afternoon, Disp: , Rfl:    fluticasone-salmeterol (ADVAIR HFA) 230-21 MCG/ACT inhaler, INHALE 2 PUFFS INTO THE LUNGS TWICE A DAY, Disp: 12  g, Rfl: 3   Heparin Na, Pork, Lock Flsh PF (BD HEPARIN POSIFLUSH) 100 UNIT/ML SOLN, USE 5 MLS IN PORT A CATH ONCE DAILY AFTER MEDICATION ADMINISTRATION AS A HEPLOCK AS DIRECTED., Disp: 150 mL, Rfl: 3   hydrocortisone (CORTEF) 10 MG tablet, Take 1-2 tablets (10-20 mg total) by mouth See admin instructions. Take 20 mg in the am and 10mg  in the evening. Take after completing prednisone (Patient taking differently: Take 10-20 mg by mouth See admin instructions. Take 20 mg in the am at 10 am and 10 mg in the afternoon. Take after completing prednisone), Disp: , Rfl:    hydrOXYzine (ATARAX) 25 MG tablet, Take 1 tablet (25 mg total) by mouth every 6 (six) hours as needed for anxiety., Disp: 30 tablet, Rfl: 0   JANUVIA 100 MG tablet, Take 100 mg by mouth daily., Disp: , Rfl:    lamoTRIgine (LAMICTAL) 200 MG tablet, TAKE 1 TABLET BY MOUTH EVERYDAY AT BEDTIME, Disp: 90 tablet, Rfl: 0   lamoTRIgine (LAMICTAL) 25 MG tablet, Take two tablets every morning., Disp: 180 tablet, Rfl: 0   Lancets (ONETOUCH DELICA PLUS LANCET33G) MISC, 3 (three) times daily. for testing, Disp: , Rfl:    lansoprazole (PREVACID) 15 MG capsule, Take 30 mg by mouth daily at 12 noon., Disp: , Rfl:    LORazepam (ATIVAN) 0.5 MG tablet, Take 1 tablet (0.5 mg total) by mouth 3 (three) times daily as needed for anxiety. (Patient taking differently: Place 0.5 mg into feeding tube 3 (three) times daily as needed for anxiety. Access Port For Nausea), Disp: 12 tablet, Rfl: 0   methocarbamol (ROBAXIN) 500 MG tablet, Take 1 tablet (500 mg total) by mouth 2 (two) times daily., Disp: 20 tablet, Rfl: 0   nitroGLYCERIN (NITROSTAT) 0.4 MG SL tablet, DISSOLVE 1 TAB UNDER THE TONGUE EVERY 5 MINUTES AS NEEDED FOR CHEST PAIN. MAX OF 3 DOSES, THEN 911., Disp:  75 tablet, Rfl: 0   NURTEC 75 MG TBDP, Take 1 tablet by mouth daily as needed., Disp: , Rfl:    OLANZapine (ZYPREXA) 5 MG tablet, Take 1 tablet (5 mg total) by mouth at bedtime., Disp: 30 tablet, Rfl: 2   ONETOUCH VERIO test strip, 3 (three) times daily. for testing, Disp: , Rfl:    Oxycodone HCl 10 MG TABS, Take 1 tablet by mouth every 6 (six) hours as needed (pain)., Disp: , Rfl:    OXYGEN, Inhale 4-5 L/min into the lungs continuous., Disp: , Rfl:    PROAIR HFA 108 (90 Base) MCG/ACT inhaler, Inhale 2 puffs into the lungs every 4 (four) hours as needed for wheezing or shortness of breath., Disp: , Rfl:    QVAR REDIHALER 80 MCG/ACT inhaler, Inhale 1 puff into the lungs 2 (two) times daily., Disp: , Rfl:    RABEprazole (ACIPHEX) 20 MG tablet, Take 1 tablet (20 mg total) by mouth daily., Disp: 30 tablet, Rfl: 0   rosuvastatin (CRESTOR) 10 MG tablet, Take 10 mg by mouth in the morning. Mid morning, Disp: , Rfl:    sacubitril-valsartan (ENTRESTO) 24-26 MG, TAKE 1 TABLET BY MOUTH TWICE A DAY, Disp: 60 tablet, Rfl: 5   Sodium Chloride Flush (NORMAL SALINE FLUSH) 0.9 % SOLN, Use as directed for central venous catheter maintenance, Disp: 600 mL, Rfl: 3   Sodium Chloride Flush (NORMAL SALINE FLUSH) 0.9 % SOLN, Use as directed for central venous catheter maintenance, Disp: 600 mL, Rfl: 3   sucralfate (CARAFATE) 1 GM/10ML suspension, Take 1 g by mouth daily as  needed (as directed for ulcers)., Disp: , Rfl:    SYNTHROID 100 MCG tablet, Take 1 tablet (100 mcg total) by mouth every morning. (Patient taking differently: Take 100 mcg by mouth every morning. * Must be name brand*), Disp: , Rfl:    Tiotropium Bromide Monohydrate (SPIRIVA RESPIMAT) 1.25 MCG/ACT AERS, Inhale 2 puffs into the lungs daily., Disp: 4 g, Rfl: 5   zolpidem (AMBIEN CR) 12.5 MG CR tablet, TAKE ONE TABLET BY MOUTH AT BEDTIME AS NEEDED FOR SLEEP., Disp: 90 tablet, Rfl: 0   zonisamide (ZONEGRAN) 100 MG capsule, Take 100 mg by mouth daily., Disp:  , Rfl:    famotidine (PEPCID) 40 MG/5ML suspension, Take 2.5 mLs (20 mg total) by mouth daily., Disp: 75 mL, Rfl: 0   topiramate (TOPAMAX) 50 MG tablet, Take 150 mg by mouth at bedtime.  (Patient not taking: Reported on 10/28/2023), Disp: , Rfl:    Physical Exam:   BP 127/74 (BP Location: Left Arm, Patient Position: Sitting, Cuff Size: Normal)   Pulse 89   Resp 19   Wt 164 lb (74.4 kg)   LMP  (LMP Unknown)   SpO2 95%   BMI 27.29 kg/m    Salient findings:  CN II-XII intact Bilateral EAC clear and TM intact with well pneumatized middle ear spaces Anterior rhinoscopy: Septum relatively midline; no masses on anterior rhinoscopy No lesions of oral cavity/oropharynx No obviously palpable neck masses/lymphadenopathy/thyromegaly No respiratory distress or stridor; able to easily tolerate secretions; voice quality class II  Procedures:  Procedure Note (Prior TFL): Findings: The nasal cavity and nasopharynx did not reveal any masses or lesions, mucosa appeared to be without obvious lesions. The tongue base, pharyngeal walls, piriform sinuses, vallecula, epiglottis and postcricoid region are normal in appearance without The visualized portion of the subglottis and proximal trachea is widely patent. The vocal folds are mobile bilaterally. There are no lesions on the free edge of the vocal folds nor elsewhere in the larynx worrisome for malignancy.    Electronically signed by: Read Drivers, MD 10/28/2023 9:55 AM   Impression & Plans:  [name] is a 53 y.o. female with history of thyroid carcinoma and chronic respiratory failure with: Dysphagia Globus sensation 3.   Vocal fatigue and dysphonia 4.   LPR  - Prior TFL is reassuring, and MBS generally reassuring. Do not think esophageal web. She continues to report dysphagia with solids and globus sensation. Has not had a GI eval, and given her other symptoms, I wonder if esophageal dysmotility or other etiology is contributing. She also has  GERD and I do think repeat eval from GI may be helpful given reassuring TFL and generally reassuring MBS. VF symptoms seem to be more MTD related. Of note, she has long-standing dysphagia, and a prior hworkup including a FEES was also reassuring.    - Will refill PPI and H2 blocker as below; can trial barrier agent such as Reflux gourmet to see if it will help - Refer to GI for ruling out dysmotility; may benefit from EGD but will defer to their expertise - Nasal saline gel given O2 use for nasal dryness - f/u PRN  Meds ordered this encounter  Medications   famotidine (PEPCID) 40 MG tablet    Sig: Take 1 tablet (40 mg total) by mouth at bedtime.    Dispense:  30 tablet    Refill:  3   pantoprazole (PROTONIX) 40 MG tablet    Sig: Take 1 tablet (40 mg total) by mouth  daily.    Dispense:  30 tablet    Refill:  3     Thank you for allowing me the opportunity to care for your patient. Please do not hesitate to contact me should you have any other questions.  Sincerely, Jovita Kussmaul, MD Otolaryngologist (ENT), [facility] Phone: [phone] Fax: [phone]  10/28/2023, 9:55 AM   MDM:  Lvl 4 - 99214 Complexity/Problems addressed: mod - multiple chronic stable problems Data complexity: mod - independent  review and interpretation of MBS - Morbidity: unclera - Prescription Drug prescribed or managed: no

## 2023-10-28 NOTE — Patient Instructions (Signed)
Reflux gourmet --- Sim Boast

## 2023-10-30 ENCOUNTER — Other Ambulatory Visit (HOSPITAL_COMMUNITY): Payer: Self-pay

## 2023-10-31 ENCOUNTER — Ambulatory Visit: Payer: BC Managed Care – PPO | Admitting: Psychiatry

## 2023-11-01 ENCOUNTER — Other Ambulatory Visit: Payer: Self-pay | Admitting: Cardiovascular Disease

## 2023-11-01 ENCOUNTER — Other Ambulatory Visit (HOSPITAL_COMMUNITY): Payer: Self-pay

## 2023-11-01 ENCOUNTER — Encounter: Payer: Self-pay | Admitting: Hematology and Oncology

## 2023-11-04 ENCOUNTER — Encounter: Payer: Self-pay | Admitting: Adult Health

## 2023-11-04 ENCOUNTER — Telehealth: Payer: BC Managed Care – PPO | Admitting: Adult Health

## 2023-11-04 DIAGNOSIS — F331 Major depressive disorder, recurrent, moderate: Secondary | ICD-10-CM

## 2023-11-04 DIAGNOSIS — F410 Panic disorder [episodic paroxysmal anxiety] without agoraphobia: Secondary | ICD-10-CM

## 2023-11-04 DIAGNOSIS — F411 Generalized anxiety disorder: Secondary | ICD-10-CM

## 2023-11-04 DIAGNOSIS — G4700 Insomnia, unspecified: Secondary | ICD-10-CM | POA: Diagnosis not present

## 2023-11-04 DIAGNOSIS — G8222 Paraplegia, incomplete: Secondary | ICD-10-CM | POA: Diagnosis not present

## 2023-11-04 DIAGNOSIS — R911 Solitary pulmonary nodule: Secondary | ICD-10-CM | POA: Diagnosis not present

## 2023-11-04 MED ORDER — FLUOXETINE HCL 20 MG PO CAPS: 20.0000 mg | ORAL_CAPSULE | Freq: Every day | ORAL | 2 refills | Status: DC

## 2023-11-04 MED ORDER — CLONAZEPAM 1 MG PO TABS: 1.0000 mg | ORAL_TABLET | Freq: Two times a day (BID) | ORAL | 2 refills | Status: DC

## 2023-11-04 MED ORDER — ALPRAZOLAM 1 MG PO TABS: ORAL_TABLET | ORAL | 0 refills | Status: DC

## 2023-11-04 NOTE — Progress Notes (Signed)
[name] [id number] Aug 31, 1971 53 y.o.  Virtual Visit via Video Note  I connected with pt @ on 11/04/23 at  1:00 PM EST by a video enabled telemedicine application and verified that I am speaking with the correct person using two identifiers.   I discussed the limitations of evaluation and management by telemedicine and the availability of in person appointments. The patient expressed understanding and agreed to proceed.  I discussed the assessment and treatment plan with the patient. The patient was provided an opportunity to ask questions and all were answered. The patient agreed with the plan and demonstrated an understanding of the instructions.   The patient was advised to call back or seek an in-person evaluation if the symptoms worsen or if the condition fails to improve as anticipated.  I provided 25 minutes of non-face-to-face time during this encounter.  The patient was located at home.  The provider was located at [facility].   [name], NP   Subjective:   Patient ID:  [name] is a 53 y.o. (DOB 1971/08/08) female.  Chief Complaint: No chief complaint on file.   HPI [name] presents for follow-up of MDD, GAD, insomnia, and panic attacks.  Describes mood today as "not good". Pleasant. Tearful. Mood symptoms - reports depression, anxiety and irritability. Reports decreased interest and motivation. Reports panic attacks. Reports worry, rumination, and over thinking. Mood has declined. Stating "I don't feel like I'm doing too good". Reports health continues to decline - currently in palliative care. Taking medications as prescribed. Working with [name] for therapy.  Energy levels lower. Active, does not have a regular exercise routine with current physical disabilities. Is unable to enjoy some usual interests and activities. Married. Lives with husband and son. Has a daughter - 59 in [place]. Spending time with family.  Appetite  adequate. Weight gain 158 to 164 pounds. Reports sleep is variable. Averages 4 hours at night a few hours during the day. Reports issues with focus and concentration difficulties. Completing tasks. Managing aspects of household. Disabled since 2016. Denies SI or HI.  Denies AH or VH. Denies self harm. Denies substance use.   Previous medication trials: Wellbutrin - mean, Zoloft-not helpful   Review of Systems:  Review of Systems  Musculoskeletal:  Negative for gait problem.  Neurological:  Negative for tremors.  Psychiatric/Behavioral:         Please refer to HPI    Medications: I have reviewed the patient's current medications.  Current Outpatient Medications  Medication Sig Dispense Refill   albuterol (PROVENTIL) (2.5 MG/3ML) 0.083% nebulizer solution Take 3 mLs (2.5 mg total) by nebulization every 6 (six) hours as needed for wheezing or shortness of breath. 360 mL 2   ALPRAZolam (XANAX) 1 MG tablet 1 tablet daily and 1 tablet PRN no more than 10 days a month. 40 tablet 0   aspirin EC 81 MG tablet Take 1 tablet (81 mg total) by mouth daily. Swallow whole. (Patient taking differently: Take 81 mg by mouth at bedtime. Chewable) 90 tablet 3   Bismuth Tribromoph-Petrolatum (XEROFORM OCCLUSIVE GAUZE STRIP) PADS Apply 1 each topically as directed. 50 each 1   bisoprolol (ZEBETA) 5 MG tablet TAKE 1/2 TABLET BY MOUTH DAILY 8 tablet 0   bumetanide (BUMEX) 1 MG tablet TAKE 1 TABLET BY MOUTH TWICE A DAY 30 tablet 0   buPROPion (WELLBUTRIN XL) 150 MG 24 hr tablet Take 1 tablet (150 mg total) by mouth daily. 90 tablet 0  clonazePAM (KLONOPIN) 1 MG tablet Take 1 tablet (1 mg total) by mouth 2 (two) times daily. 60 tablet 2   cyclobenzaprine (FLEXERIL) 5 MG tablet Take 1 tablet (5 mg total) by mouth 3 (three) times daily as needed for muscle spasms. 20 tablet 0   diclofenac (VOLTAREN) 50 MG EC tablet Take 50 mg by mouth 2 (two) times daily. *as needed for migraine, Can repeat if needed*      diphenhydrAMINE (BENADRYL) 12.5 MG/5ML elixir Take 10 mLs (25 mg total) by mouth every 6 (six) hours as needed (nausea). 120 mL 0   doxycycline (VIBRA-TABS) 100 MG tablet Take 1 tablet (100 mg total) by mouth 2 (two) times daily. 14 tablet 0   EMGALITY 120 MG/ML SOSY Inject 120 mg into the skin every 28 (twenty-eight) days.     EPINEPHRINE 0.3 mg/0.3 mL IJ SOAJ injection INJECT 0.3 MG INTO THE MUSCLE AS NEEDED FOR ANAPHYLAXIS. 2 each 1   escitalopram (LEXAPRO) 20 MG tablet Take 1 tablet (20 mg total) by mouth every evening. 30 tablet 5   famotidine (PEPCID) 40 MG tablet Take 1 tablet (40 mg total) by mouth at bedtime. 30 tablet 3   famotidine (PEPCID) 40 MG/5ML suspension Take 2.5 mLs (20 mg total) by mouth daily. 75 mL 0   famotidine (PEPCID) 40 MG/5ML suspension TAKE 1.3 MLS (10.4 MG TOTAL) BY MOUTH DAILY - DISCARD BOTTLE AFTER 30 DAYS FROM PICKUP 50 mL 0   FLORASTOR 250 MG capsule Take 250 mg by mouth 2 (two) times daily. Mid Morning and Mid Afternoon     fluticasone-salmeterol (ADVAIR HFA) 230-21 MCG/ACT inhaler INHALE 2 PUFFS INTO THE LUNGS TWICE A DAY 12 g 3   Heparin Na, Pork, Lock Flsh PF (BD HEPARIN POSIFLUSH) 100 UNIT/ML SOLN USE 5 MLS IN PORT A CATH ONCE DAILY AFTER MEDICATION ADMINISTRATION AS A HEPLOCK AS DIRECTED. 150 mL 3   hydrocortisone (CORTEF) 10 MG tablet Take 1-2 tablets (10-20 mg total) by mouth See admin instructions. Take 20 mg in the am and 10mg  in the evening. Take after completing prednisone (Patient taking differently: Take 10-20 mg by mouth See admin instructions. Take 20 mg in the am at 10 am and 10 mg in the afternoon. Take after completing prednisone)     hydrOXYzine (ATARAX) 25 MG tablet Take 1 tablet (25 mg total) by mouth every 6 (six) hours as needed for anxiety. 30 tablet 0   JANUVIA 100 MG tablet Take 100 mg by mouth daily.     lamoTRIgine (LAMICTAL) 200 MG tablet TAKE 1 TABLET BY MOUTH EVERYDAY AT BEDTIME 90 tablet 0   lamoTRIgine (LAMICTAL) 25 MG tablet Take two  tablets every morning. 180 tablet 0   Lancets (ONETOUCH DELICA PLUS LANCET33G) MISC 3 (three) times daily. for testing     lansoprazole (PREVACID) 15 MG capsule Take 30 mg by mouth daily at 12 noon.     LORazepam (ATIVAN) 0.5 MG tablet Take 1 tablet (0.5 mg total) by mouth 3 (three) times daily as needed for anxiety. (Patient taking differently: Place 0.5 mg into feeding tube 3 (three) times daily as needed for anxiety. Access Port For Nausea) 12 tablet 0   methocarbamol (ROBAXIN) 500 MG tablet Take 1 tablet (500 mg total) by mouth 2 (two) times daily. 20 tablet 0   nitroGLYCERIN (NITROSTAT) 0.4 MG SL tablet DISSOLVE 1 TAB UNDER THE TONGUE EVERY 5 MINUTES AS NEEDED FOR CHEST PAIN. MAX OF 3 DOSES, THEN 911. 75 tablet 0   NURTEC  75 MG TBDP Take 1 tablet by mouth daily as needed.     OLANZapine (ZYPREXA) 5 MG tablet Take 1 tablet (5 mg total) by mouth at bedtime. 30 tablet 2   ONETOUCH VERIO test strip 3 (three) times daily. for testing     Oxycodone HCl 10 MG TABS Take 1 tablet by mouth every 6 (six) hours as needed (pain).     OXYGEN Inhale 4-5 L/min into the lungs continuous.     pantoprazole (PROTONIX) 40 MG tablet Take 1 tablet (40 mg total) by mouth daily. 30 tablet 3   PROAIR HFA 108 (90 Base) MCG/ACT inhaler Inhale 2 puffs into the lungs every 4 (four) hours as needed for wheezing or shortness of breath.     QVAR REDIHALER 80 MCG/ACT inhaler Inhale 1 puff into the lungs 2 (two) times daily.     RABEprazole (ACIPHEX) 20 MG tablet Take 1 tablet (20 mg total) by mouth daily. 30 tablet 0   rosuvastatin (CRESTOR) 10 MG tablet Take 10 mg by mouth in the morning. Mid morning     sacubitril-valsartan (ENTRESTO) 24-26 MG TAKE 1 TABLET BY MOUTH TWICE A DAY 60 tablet 5   Sodium Chloride Flush (NORMAL SALINE FLUSH) 0.9 % SOLN Use as directed for central venous catheter maintenance 600 mL 3   Sodium Chloride Flush (NORMAL SALINE FLUSH) 0.9 % SOLN Use as directed for central venous catheter maintenance 600  mL 3   sucralfate (CARAFATE) 1 GM/10ML suspension Take 1 g by mouth daily as needed (as directed for ulcers).     SYNTHROID 100 MCG tablet Take 1 tablet (100 mcg total) by mouth every morning. (Patient taking differently: Take 100 mcg by mouth every morning. * Must be name brand*)     Tiotropium Bromide Monohydrate (SPIRIVA RESPIMAT) 1.25 MCG/ACT AERS Inhale 2 puffs into the lungs daily. 4 g 5   topiramate (TOPAMAX) 50 MG tablet Take 150 mg by mouth at bedtime.  (Patient not taking: Reported on 10/28/2023)     zolpidem (AMBIEN CR) 12.5 MG CR tablet TAKE ONE TABLET BY MOUTH AT BEDTIME AS NEEDED FOR SLEEP. 90 tablet 0   zonisamide (ZONEGRAN) 100 MG capsule Take 100 mg by mouth daily.     No current facility-administered medications for this visit.    Medication Side Effects: None  Allergies:  Allergies  Allergen Reactions   Ferrous Bisglycinate Chelate [Iron] Anaphylaxis and Other (See Comments)    Only IV - only FERRLICET   Mushroom Extract Complex (Do Not Select) Anaphylaxis   Na Ferric Gluc Cplx In Sucrose Anaphylaxis   Cymbalta [Duloxetine Hcl] Swelling and Anxiety   Hydromorphone Other (See Comments)    BP drop and heart rate drops 5.1.20 PT REPORTS THAT SHE TAKES DILAUDID AT HOME   Ondansetron Hcl Other (See Comments)   Promethazine Other (See Comments)    Other reaction(s): Unknown   Succinylcholine Other (See Comments)    Lock Jaw   Buprenorphine Hcl Hives   Compazine Other (See Comments)    Altered mental status Aggression   Duloxetine     Other reaction(s): Not available   Metoclopramide Other (See Comments)    Dystonia   Morphine And Codeine Hives   Ondansetron Hives and Rash    Other reaction(s): Unknown Other reaction(s): Unknown   Promethazine Hcl Hives   Tegaderm Ag Mesh [Silver] Rash    Old formulation only, is able to tolerate new formulation    Past Medical History:  Diagnosis Date   Addison's  disease [facility])    Adrenal insufficiency (HCC)    Anemia     Anxiety    Aortic stenosis    Aortic stenosis    Appendicitis 12/19/09   Appendicitis    Breast cancer (HCC)    STATUS POST BILATERAL MASTECTOMY. STATUS POST RECONSTRUCTION. SHE HAD SILICONE BREAST IMPLANTS AND THE LEFT IMPLANT IS LEAKING SLIGHTLY   Cellulitis of right middle finger 11/07/2018   Cervical cancer (HCC) 12/23/2018   Chest pain    CHF with right heart failure (HCC) 04/17/2017   Chronic respiratory failure with hypoxia (HCC) 12/23/2018   Cough variant asthma 04/13/2019   Depression    GERD (gastroesophageal reflux disease)    takes Dexilant and carafate and gi coctail    Headache    migraines on a daily and monthly regimen    Heart murmur    History of kidney stones    Hodgkin lymphoma (HCC)    STATUS POST MANTLE RADIATION   Hodgkin's lymphoma (HCC)    1987   Hypertension    Hypoxia    Necrotizing fasciitis (HCC) 12/23/2018   Non-ischemic cardiomyopathy (HCC)    Osteoporosis    Palpitations    Pituitary adenoma (HCC) 12/23/2018   Pneumonia    PONV (postoperative nausea and vomiting)    Pre-diabetes    per pt; no meds   Pulmonary hypertension (HCC) 12/23/2018   Raynaud phenomenon    Right heart failure (HCC) 04/17/2017   Seizures (HCC)    last febrile seizure was approx 3 weeks ago per report on 12/01/2020   Sleep apnea    upcoming sleep study per pt    Supplemental oxygen dependent    3 liters   SVT (supraventricular tachycardia) (HCC)    Tachycardia    Thyroid cancer (HCC)    STATUS POST SURGICAL REMOVAL-CURRENT ON THYROID REPLACEMENT    Family History  Family history unknown: Yes    Social History   Socioeconomic History   Marital status: Married    Spouse name: Not on file   Number of children: Not on file   Years of education: Not on file   Highest education level: Not on file  Occupational History   Not on file  Tobacco Use   Smoking status: Never   Smokeless tobacco: Never  Vaping Use   Vaping status: Never Used  Substance and Sexual Activity    Alcohol use: Not Currently    Comment: social    Drug use: No   Sexual activity: Not Currently    Birth control/protection: Surgical  Other Topics Concern   Not on file  Social History Narrative   Not on file   Social Drivers of Health   Financial Resource Strain: Medium Risk (08/21/2017)   Received from [facility], [facility]   Overall Financial Resource Strain (CARDIA)    Difficulty of Paying Living Expenses: Somewhat hard  Food Insecurity: No Food Insecurity (04/23/2023)   Hunger Vital Sign    Worried About Running Out of Food in the Last Year: Never true    Ran Out of Food in the Last Year: Never true  Transportation Needs: No Transportation Needs (04/23/2023)   PRAPARE - Administrator, Civil Service (Medical): No    Lack of Transportation (Non-Medical): No  Physical Activity: Unknown (08/21/2017)   Received from [facility], [facility]   Exercise Vital Sign    Days of Exercise per Week: 0 days  Minutes of Exercise per Session: Not on file  Stress: Stress Concern Present (08/21/2017)   Received from [facility], [facility]   [employer] of Occupational Health - Occupational Stress Questionnaire    Feeling of Stress : Very much  Social Connections: Unknown (02/12/2022)   Received from [facility], [facility]   Social Network    Social Network: Not on file  Intimate Partner Violence: Not At Risk (04/23/2023)   Humiliation, Afraid, Rape, and Kick questionnaire    Fear of Current or Ex-Partner: No    Emotionally Abused: No    Physically Abused: No    Sexually Abused: No    Past Medical History, Surgical history, Social history, and Family history were reviewed and updated as appropriate.   Please see review of systems for further details on the patient's review from today.   Objective:   Physical Exam:  LMP  (LMP Unknown)    Physical Exam Constitutional:      General: She is not in acute distress. Musculoskeletal:        General: No deformity.  Neurological:     Mental Status: She is alert and oriented to person, place, and time.     Coordination: Coordination normal.  Psychiatric:        Attention and Perception: Attention and perception normal. She does not perceive auditory or visual hallucinations.        Mood and Affect: Mood is anxious and depressed. Affect is flat and tearful. Affect is not labile, blunt, angry or inappropriate.        Speech: Speech normal.        Behavior: Behavior normal.        Thought Content: Thought content normal. Thought content is not paranoid or delusional. Thought content does not include homicidal or suicidal ideation. Thought content does not include homicidal or suicidal plan.        Cognition and Memory: Cognition and memory normal.        Judgment: Judgment normal.     Comments: Insight intact     Lab Review:     Component Value Date/Time   NA 141 09/03/2023 1315   NA 140 08/20/2022 1513   K 3.2 (L) 09/03/2023 1315   CL 105 09/03/2023 1315   CO2 26 09/03/2023 1315   GLUCOSE 170 (H) 09/03/2023 1315   BUN 28 (H) 09/03/2023 1315   BUN 28 (H) 08/20/2022 1513   CREATININE 1.45 (H) 09/03/2023 1315   CREATININE 1.08 (H) 02/09/2022 1202   CALCIUM 8.4 (L) 09/03/2023 1315   PROT 6.8 09/03/2023 1315   PROT 7.1 01/23/2018 1225   ALBUMIN 3.8 09/03/2023 1315   ALBUMIN 4.4 01/23/2018 1225   AST 18 09/03/2023 1315   AST 26 02/09/2022 1202   ALT 25 09/03/2023 1315   ALT 40 02/09/2022 1202   ALKPHOS 56 09/03/2023 1315   BILITOT 0.3 09/03/2023 1315   BILITOT 0.4 02/09/2022 1202   GFRNONAA 43 (L) 09/03/2023 1315   GFRNONAA >60 02/09/2022 1202   GFRAA >60 04/27/2020 0613   GFRAA >60 02/10/2020 1020       Component Value Date/Time   WBC 13.6 (H) 09/03/2023 1315   RBC 3.84 (L) 09/03/2023 1315   HGB 9.6 (L) 09/03/2023 1315   HGB 10.6 (L) 08/20/2022 1513   HCT  31.1 (L) 09/03/2023 1315   HCT 34.4 08/20/2022 1513   PLT 332 09/03/2023 1315   PLT 414 08/20/2022 1513   MCV 81.0 09/03/2023  1315   MCV 78 (L) 08/20/2022 1513   MCH 25.0 (L) 09/03/2023 1315   MCHC 30.9 09/03/2023 1315   RDW 18.4 (H) 09/03/2023 1315   RDW 16.5 (H) 08/20/2022 1513   LYMPHSABS 2.2 09/03/2023 1315   MONOABS 1.0 09/03/2023 1315   EOSABS 0.1 09/03/2023 1315   BASOSABS 0.1 09/03/2023 1315    No results found for: "POCLITH", "LITHIUM"   No results found for: "PHENYTOIN", "PHENOBARB", "VALPROATE", "CBMZ"   .res Assessment: Plan:    Plan:  PDMP reviewed  Clonazepam 1mg  - twice daily. Xanax 1mg  daily and one extra as needed for extreme anxiety up to10 days a month.  Add Prozac 20mg  daily D/C Lexapro 20mg  daily - take 1/2 tablet daily x 7 days, then d/c  Wellbutrin XL150mg  every morning - did not tolerate increase in dose - reports seizure history  Lamictal 200mg  daily Lamictal 50mg  every morning  Olanzapine 5mg  at hs   Seeing [name] therapy visits.  Discussed potential benefits, risk, and side effects of benzodiazepines to include potential risk of tolerance and dependence, as well as possible drowsiness.  Advised patient not to drive if experiencing drowsiness and to take lowest possible effective dose to minimize risk of dependence and tolerance.   Counseled patient regarding potential benefits, risks, and side effects of Lamictal to include potential risk of Stevens-Johnson syndrome. Advised patient to stop taking Lamictal and contact office immediately if rash develops and to seek urgent medical attention if rash is severe and/or spreading quickly.  Discussed potential metabolic side effects associated with atypical antipsychotics, as well as potential risk for movement side effects. Advised pt to contact office if movement side effects occur.    RTC 4 weeks  30 minutes spent dedicated to the care of this patient on the date of this encounter to  include pre-visit review of records, ordering of medication, post visit documentation, and face-to-face time with the patient discussing MDD, GAD, insomnia, and panic attacks. Discussed continuing current medication regimen.  Patient advised to contact office with any questions, adverse effects, or acute worsening in signs and symptoms.  There are no diagnoses linked to this encounter.   Please see After Visit Summary for patient specific instructions.  Future Appointments  Date Time Provider Department Center  11/04/2023  1:00 PM Meldon Hanzlik, Thereasa Solo, NP [facility] None  11/21/2023  9:00 AM Mathis Fare, LCSW [facility] None    No orders of the defined types were placed in this encounter.     -------------------------------

## 2023-11-07 ENCOUNTER — Other Ambulatory Visit: Payer: Self-pay

## 2023-11-07 ENCOUNTER — Other Ambulatory Visit (HOSPITAL_COMMUNITY): Payer: Self-pay

## 2023-11-07 DIAGNOSIS — F410 Panic disorder [episodic paroxysmal anxiety] without agoraphobia: Secondary | ICD-10-CM

## 2023-11-07 MED ORDER — ALPRAZOLAM 1 MG PO TABS: ORAL_TABLET | ORAL | 0 refills | Status: DC

## 2023-11-07 MED ORDER — NORMAL SALINE FLUSH 0.9 % IV SOLN
INTRAVENOUS | 3 refills | Status: DC
  Filled 2023-11-07: qty 600, 30d supply, fill #0
  Filled 2023-11-14: qty 600, 30d supply, fill #1
  Filled 2023-11-26: qty 600, 30d supply, fill #2
  Filled 2023-12-12: qty 600, 30d supply, fill #3

## 2023-11-07 NOTE — Telephone Encounter (Signed)
 Pended alprazolam  to [employer] [facility] [address] [facility]

## 2023-11-13 ENCOUNTER — Other Ambulatory Visit: Payer: Self-pay | Admitting: Adult Health

## 2023-11-13 ENCOUNTER — Other Ambulatory Visit: Payer: Self-pay | Admitting: Cardiovascular Disease

## 2023-11-13 ENCOUNTER — Ambulatory Visit: Payer: Medicare Other | Admitting: Psychiatry

## 2023-11-13 DIAGNOSIS — F331 Major depressive disorder, recurrent, moderate: Secondary | ICD-10-CM

## 2023-11-13 NOTE — Progress Notes (Signed)
[facility] Counselor/Therapist Progress Note  Patient ID: [name], MRN: [id number],    Date: 11/13/2023  Time Spent: 50 minutes   Treatment Type: Individual Therapy  Virtual Visit via Telehealth Note: Mychart video Connected with patient by a telemedicine/telehealth application, with their informed consent, and verified patient privacy and that I am speaking with the correct person using two identifiers. I discussed the limitations, risks, security and privacy concerns of performing psychotherapy and the availability of in person appointments. I also discussed with the patient that there may be a patient responsible charge related to this service. The patient expressed understanding and agreed to proceed. I discussed the treatment planning with the patient. The patient was provided an opportunity to ask questions and all were answered. The patient agreed with the plan and demonstrated an understanding of the instructions. The patient was advised to call  our office if  symptoms worsen or feel they are in a crisis state and need immediate contact.   Therapist Location: office Patient Location: home  Reported Symptoms:  depression, anxiety, anger, fear   Mental Status Exam:  Appearance:   Casual     Behavior:  Appropriate and Sharing  Motor:  Affected by her cancer  Speech/Language:   Clear and Coherent  Affect:  Depressed and anxious  Mood:  anxious and depressed  Thought process:  goal directed  Thought content:    WNL  Sensory/Perceptual disturbances:    WNL  Orientation:  oriented to person, place, time/date, situation, day of week, month of year, year, and stated date of Feb. 12, 2025  Attention:  Fair  Concentration:  Fair  Memory:  Some short term memory issues reported  Fund of knowledge:   Fair  Insight:    Good and Fair  Judgment:   Good and Fair  Impulse Control:  Good and Fair   Risk Assessment: Danger to Self:  No Self-injurious Behavior: No Danger  to Others: No Duty to Warn:no Physical Aggression / Violence:No  Access to Firearms a concern: No  Gang Involvement:No   Subjective: Patient reports anger, feeling cheated because of her illness, frustration,  and did well in session today talking through in more depth her anger, fears, and frustration. "I dont' want to upset my husband and other family" and expresses gratitude for our sessions as she is able to vent and express raw feelings without worrying about hurting a family member. Good to feel heard. Processing her anger, fears, frustration with family, and frustration with certain other people, and fears with her cancer. Seem to feel supported in our session today and able to release some "tension, sadness, fears, and frustration." States she needs "a sooner appt next time" and she is being booked for appt next week.  Denies any SI but is really struggling with some sadness and fear.  Felt better after realizing she was can be able to be seen again next week and knows that she can call in the meantime if needed.  Interventions: Cognitive Behavioral Therapy and Ego-Supportive  Diagnosis:   ICD-10-CM   1. Major depressive disorder, recurrent episode, moderate (HCC)  F33.1      Plan:  Patient patient today doing some much-needed work regarding her anxiety, fears, anger, and frustration related to her ongoing cancer issues.  Seem to be well supported today in session and was calmer by the end of session.  Good energy in session even though it was difficult at times with her sadness, anger, and "  feeling cheated because of her illness".  Patient has made progress and needs to continue working with goal-directed behaviors so as to really receive the care she needs and attention she needs in her life at this time and trying to manage her emotional and health challenges.  Goal review and progress/challenges noted with patient.  Next appointment within 2 to 3 weeks.   Mathis Fare,  LCSW

## 2023-11-13 NOTE — Telephone Encounter (Signed)
Dc 2/3

## 2023-11-14 ENCOUNTER — Encounter: Payer: Self-pay | Admitting: Hematology and Oncology

## 2023-11-14 ENCOUNTER — Other Ambulatory Visit (HOSPITAL_COMMUNITY): Payer: Self-pay

## 2023-11-21 ENCOUNTER — Ambulatory Visit: Payer: Medicare Other | Admitting: Psychiatry

## 2023-11-25 ENCOUNTER — Other Ambulatory Visit: Payer: Self-pay | Admitting: Cardiovascular Disease

## 2023-11-25 ENCOUNTER — Other Ambulatory Visit (INDEPENDENT_AMBULATORY_CARE_PROVIDER_SITE_OTHER): Payer: Self-pay | Admitting: Otolaryngology

## 2023-11-25 ENCOUNTER — Ambulatory Visit: Payer: Medicare Other | Admitting: Psychiatry

## 2023-11-25 DIAGNOSIS — F331 Major depressive disorder, recurrent, moderate: Secondary | ICD-10-CM | POA: Diagnosis not present

## 2023-11-25 DIAGNOSIS — F411 Generalized anxiety disorder: Secondary | ICD-10-CM | POA: Diagnosis not present

## 2023-11-25 NOTE — Progress Notes (Signed)
 [facility] Counselor/Therapist Progress Note  Patient ID: [name], MRN: [id number],    Date: 11/25/2023  Time Spent:  48 minutes  Treatment Type: Individual Therapy  Virtual Visit via Telehealth Note  Connected with patient by a telemedicine/telehealth application, with their informed consent, and verified patient privacy and that I am speaking with the correct person using two identifiers. I discussed the limitations, risks, security and privacy concerns of performing psychotherapy and the availability of in person appointments. I also discussed with the patient that there may be a patient responsible charge related to this service. The patient expressed understanding and agreed to proceed. I discussed the treatment planning with the patient. The patient was provided an opportunity to ask questions and all were answered. The patient agreed with the plan and demonstrated an understanding of the instructions. The patient was advised to call  our office if  symptoms worsen or feel they are in a crisis state and need immediate contact.   Therapist Location: office Patient Location: home   Reported Symptoms: anxiety, panic, depression, anger, resentful, envious of others   Mental Status Exam:  Appearance:   Casual     Behavior:  Appropriate and Sharing  Motor:  Affected by her health condition  Speech/Language:   Clear and Coherent  Affect:  Depressed  Mood:  angry, anxious, depressed, irritable, and sad  Thought process:  goal directed  Thought content:    WNL  Sensory/Perceptual disturbances:    WNL  Orientation:  oriented to person, place, time/date, situation, day of week, month of year, year, and stated date of Feb. 24, 2025  Attention:  Fair  Concentration:  Fair  Memory:  Some forgetting and hard to keep up with my days"  Fund of knowledge:   Fair  Insight:    Good and Fair  Judgment:   Good and Fair  Impulse Control:  Good   Risk Assessment: Danger to Self:   No Self-injurious Behavior: No Danger to Others: No Duty to Warn:no Physical Aggression / Violence:No  Access to Firearms a concern: No  Gang Involvement:No   Subjective:  Patient today reporting symptoms of anxiety, depression, irritable, sad, and angry. Dealing with feeling alone and some issues in relationship to husband "who can be short-tempered, easily angered, and sometimes yells and makes comments that hurt me."  Obviously needed to discuss her relationship with husband more in session today and she agreed. Tearfully processing today some issues related to marital issues, communication that is hurtful, and patient's frustration and anger. Encouraged patient in her sharing her hurts, anger, and frustration and she did so very openly especially her anger and frustration, acknowledging her anger and frustration and feeling very hurt by others in family primarily. Also processing more of her anger about her illness and encouraged patient's open expression of her anger and frustration, which she did and seemed more calm and grounded by end of session, although still understandably having a lot of thoughts and feelings about her health situation. Continues to express appreciation for her sessions online which enable her to continues working on her coping skills and receive support for the heavy feelings she carries inside. Sessions also serve to help patient release feelings of sadness, anger, fears, and frustration, and as a result feels less aloneness, fear, tension, sadness, and anger. Processing some positives/negatives of several family contacts recently.   Interventions: Cognitive Behavioral Therapy and Ego-Supportive  Long-term goal:  Patient will confront her anxiety, fears, anger reported  re: her illness and use time in therapy and beyond to work on this, while receiving feedback and support from therapist. Help her determine what she feels is realistic for her considering her health  challenges and difficult prognosis.  Short-term goal: Participate in therapy and initiate communication of needs and desires at this point in her struggle physically.  Strategies: Share and talk through anxiety, anger, and depression that are experienced and work to find and use helpful coping tools to better manage those feelings.   Diagnosis:   ICD-10-CM   1. Major depressive disorder, recurrent episode, moderate (HCC)  F33.1     2. Generalized anxiety disorder  F41.1      Plan:  Patient actively participating in session today venting freely her anxiety, depression, anger, frustration, sadness, and irritability. Did well in working through some specific points and changes within her treatment goals, and showing effort and determination, although sometimes expressed less motivation. "Moods do change at times, and describes some feeling cheated at times due to her illness." Denies any SI. Felt the tearfulness was helpful to her today in feeling heard and being able to openly express feelings and frustrations and she continues to battle her cancer. Patient has made progress and needs to continue her work with goal-directed behaviors to enable herself to get the care and attention she needs in trying to manage the challenges she experiences with her health and emotional status.   Goal review and progress/challenges noted with patient.  Next appointment within 2-3 weeks.   Mathis Fare, LCSW

## 2023-11-26 ENCOUNTER — Other Ambulatory Visit (HOSPITAL_COMMUNITY): Payer: Self-pay

## 2023-11-27 DIAGNOSIS — F419 Anxiety disorder, unspecified: Secondary | ICD-10-CM | POA: Diagnosis not present

## 2023-11-27 DIAGNOSIS — E782 Mixed hyperlipidemia: Secondary | ICD-10-CM | POA: Diagnosis not present

## 2023-11-27 DIAGNOSIS — F5101 Primary insomnia: Secondary | ICD-10-CM | POA: Diagnosis not present

## 2023-11-27 DIAGNOSIS — Z13220 Encounter for screening for lipoid disorders: Secondary | ICD-10-CM | POA: Diagnosis not present

## 2023-11-27 DIAGNOSIS — E890 Postprocedural hypothyroidism: Secondary | ICD-10-CM | POA: Diagnosis not present

## 2023-11-27 DIAGNOSIS — D508 Other iron deficiency anemias: Secondary | ICD-10-CM | POA: Diagnosis not present

## 2023-11-27 DIAGNOSIS — Z131 Encounter for screening for diabetes mellitus: Secondary | ICD-10-CM | POA: Diagnosis not present

## 2023-11-27 DIAGNOSIS — I5022 Chronic systolic (congestive) heart failure: Secondary | ICD-10-CM | POA: Diagnosis not present

## 2023-11-27 DIAGNOSIS — F339 Major depressive disorder, recurrent, unspecified: Secondary | ICD-10-CM | POA: Diagnosis not present

## 2023-11-27 DIAGNOSIS — R7301 Impaired fasting glucose: Secondary | ICD-10-CM | POA: Diagnosis not present

## 2023-11-27 DIAGNOSIS — R748 Abnormal levels of other serum enzymes: Secondary | ICD-10-CM | POA: Diagnosis not present

## 2023-11-28 ENCOUNTER — Other Ambulatory Visit: Payer: Self-pay | Admitting: Adult Health

## 2023-11-28 ENCOUNTER — Telehealth: Payer: Self-pay

## 2023-11-28 DIAGNOSIS — F331 Major depressive disorder, recurrent, moderate: Secondary | ICD-10-CM

## 2023-11-28 DIAGNOSIS — F411 Generalized anxiety disorder: Secondary | ICD-10-CM

## 2023-11-28 NOTE — Telephone Encounter (Signed)
 [name] reported calling after hours crying that she couldn't get her benzos. She last filled 2/2 and 2/5 so it is too early. Did talk with the new pharmacy manager and told him she was hospice. He was concerned about the risks. He said he would make note that she was hospice. The only issue is that is too early to fill. LVM to [facility].

## 2023-12-02 ENCOUNTER — Encounter: Payer: Self-pay | Admitting: Hematology and Oncology

## 2023-12-02 ENCOUNTER — Other Ambulatory Visit (HOSPITAL_COMMUNITY): Payer: Self-pay

## 2023-12-02 ENCOUNTER — Other Ambulatory Visit: Payer: Self-pay

## 2023-12-02 DIAGNOSIS — G8222 Paraplegia, incomplete: Secondary | ICD-10-CM | POA: Diagnosis not present

## 2023-12-02 DIAGNOSIS — R911 Solitary pulmonary nodule: Secondary | ICD-10-CM | POA: Diagnosis not present

## 2023-12-04 ENCOUNTER — Ambulatory Visit (INDEPENDENT_AMBULATORY_CARE_PROVIDER_SITE_OTHER): Admitting: Psychiatry

## 2023-12-04 DIAGNOSIS — F411 Generalized anxiety disorder: Secondary | ICD-10-CM

## 2023-12-04 NOTE — Progress Notes (Signed)
 [facility] Counselor/Therapist Progress Note  Patient ID: [name], MRN: [id number],    Date: 12/04/2023  Time Spent: 45 minutes (Video Session)  Treatment Type: Individual Therapy   Virtual Visit via Telehealth Note: MyChart Video session: Connected with patient by a telemedicine/telehealth application, with their informed consent, and verified patient privacy and that I am speaking with the correct person using two identifiers. I discussed the limitations, risks, security and privacy concerns of performing psychotherapy and the availability of in person appointments. I also discussed with the patient that there may be a patient responsible charge related to this service. The patient expressed understanding and agreed to proceed. I discussed the treatment planning with the patient. The patient was provided an opportunity to ask questions and all were answered. The patient agreed with the plan and demonstrated an understanding of the instructions. The patient was advised to call  our office if  symptoms worsen or feel they are in a crisis state and need immediate contact.   Therapist Location: office Patient Location: home   Reported Symptoms:  anxiety, depression, "unappreciated", resentful at times, hope can be difficult, denies any SI    Mental Status Exam:  Appearance:   Casual     Behavior:  Appropriate and Sharing  Motor:  "Not as good and needs help in showering and other daily tasks"  Speech/Language:   Clear and Coherent  Affect:  Depressed and anxious, irritable, angry  Mood:  angry, anxious, depressed, irritable, and sad  Thought process:  goal directed  Thought content:    Rumination  Sensory/Perceptual disturbances:    WNL  Orientation:  oriented to person, place, time/date, situation, day of week, month of year, year, and stated date of December 04, 2023  Attention:  Fair  Concentration:  Good and Fair  Memory:  Some memory issues noted and not sure if some of  it might be related to her condition or her meds  Fund of knowledge:   Good  Insight:    Good and Fair  Judgment:   Good and Fair  Impulse Control:  Good   Risk Assessment: Danger to Self:  No Self-injurious Behavior: No Danger to Others: No Duty to Warn:no Physical Aggression / Violence:No  Access to Firearms a concern: No  Gang Involvement:No   Subjective:  Patient today in video session and actively participating as she talks further through her anxiety, sadness, anger, irritability, and depression. Needed session today to share and process these feelings, along with some positive steps the family is taking to intentionally access and follow through on some planned events coming up soon including family  trip to [facility] if patient remains able. Mixed feelings of happiness and certain plans for example with a trip to [employer], but also "knowing it does not change my situation and my health."  Talked about this further with patient, who is still struggling with some anger which we also talked through some today in session.  Appreciates being able to vent and say what ever she needs to say and not worry about being judged".  Does have moments of being short tempered and angry easily by others in the family.  Some tearfulness intermittently.  Encouraged patient and her continuing to speak very openly and share in sessions her hurt, anger, and frustration.  More calm and grounded by end of session.  Does feel that sessions help her in releasing feelings of sadness, anger, frustration, and fears and ends up feeling less  alone and less tension within herself and with others per her report.  Interventions: Cognitive Behavioral Therapy and Ego-Supportive  Long-term goal:  Patient will confront her anxiety, fears, anger reported re: her illness and use time in therapy and beyond to work on this, while receiving feedback and support from therapist. Help her determine what she feels is realistic  for her considering her health challenges and difficult prognosis.  Short-term goal: Participate in therapy and initiate communication of needs and desires at this point in her struggle physically.  Strategies: Share and talk through anxiety, anger, and depression that are experienced and work to find and use helpful coping tools to better manage those feelings.   Diagnosis:   ICD-10-CM   1. Generalized anxiety disorder  F41.1      Plan: Patient very active in her participation in session and working further today on her anxiety, frustration, depression, anger, irritability, and sadness.  She reports anxiety to be her main symptom which we focused primarily on but also allowed patient to talk freely as noted above regarding her sadness, anxiety, depression, anger, and frustrations.  Continues to be very open in her talking.  Denies any SI.  Continues to have mood changes at times some when she is doing better and looking forward to things such as upcoming family trip to take her to [facility] in the near future while she is able, but also can have mood changes and describes more feelings of "feeling cheated at times because of her illness."  Continues to work through the extremes and some of her feelings and trying to be more self-accepting particularly in light of all she is going through physically and emotionally.  Does seem to feel that her sessions and being able to talk through and say anything she wants to say and deal with her illness as well as other issues within the family that involve her, is important and helpful to her. This patient has made progress and definitely needs to continue her work with goal-directed behaviors to enable herself to get the care and attention she needs at this point in her life while in palliative/hospice care and needing to manage the challenges she experiences with her health and emotional status.  Goal review and progress/challenges noted with patient.  Next  appointment within 2 weeks.   Mathis Fare, LCSW

## 2023-12-07 ENCOUNTER — Encounter (HOSPITAL_BASED_OUTPATIENT_CLINIC_OR_DEPARTMENT_OTHER): Payer: Self-pay | Admitting: Emergency Medicine

## 2023-12-07 ENCOUNTER — Emergency Department (HOSPITAL_BASED_OUTPATIENT_CLINIC_OR_DEPARTMENT_OTHER)
Admission: EM | Admit: 2023-12-07 | Discharge: 2023-12-07 | Disposition: A | Discharge status: Home / Self Care | Attending: Emergency Medicine | Admitting: Emergency Medicine

## 2023-12-07 ENCOUNTER — Emergency Department (HOSPITAL_BASED_OUTPATIENT_CLINIC_OR_DEPARTMENT_OTHER)

## 2023-12-07 ENCOUNTER — Other Ambulatory Visit: Payer: Self-pay

## 2023-12-07 DIAGNOSIS — Z85850 Personal history of malignant neoplasm of thyroid: Secondary | ICD-10-CM | POA: Diagnosis not present

## 2023-12-07 DIAGNOSIS — E034 Atrophy of thyroid (acquired): Secondary | ICD-10-CM | POA: Diagnosis not present

## 2023-12-07 DIAGNOSIS — Z8541 Personal history of malignant neoplasm of cervix uteri: Secondary | ICD-10-CM | POA: Diagnosis not present

## 2023-12-07 DIAGNOSIS — R1310 Dysphagia, unspecified: Secondary | ICD-10-CM | POA: Diagnosis not present

## 2023-12-07 DIAGNOSIS — Z853 Personal history of malignant neoplasm of breast: Secondary | ICD-10-CM | POA: Insufficient documentation

## 2023-12-07 DIAGNOSIS — R09A2 Foreign body sensation, throat: Secondary | ICD-10-CM | POA: Diagnosis not present

## 2023-12-07 DIAGNOSIS — Z7982 Long term (current) use of aspirin: Secondary | ICD-10-CM | POA: Diagnosis not present

## 2023-12-07 DIAGNOSIS — I6523 Occlusion and stenosis of bilateral carotid arteries: Secondary | ICD-10-CM | POA: Diagnosis not present

## 2023-12-07 DIAGNOSIS — Z8571 Personal history of Hodgkin lymphoma: Secondary | ICD-10-CM | POA: Insufficient documentation

## 2023-12-07 DIAGNOSIS — I700 Atherosclerosis of aorta: Secondary | ICD-10-CM | POA: Diagnosis not present

## 2023-12-07 LAB — BASIC METABOLIC PANEL
Anion gap: 9 (ref 5–15)
BUN: 28 mg/dL — ABNORMAL HIGH (ref 6–20)
CO2: 28 mmol/L (ref 22–32)
Calcium: 9.1 mg/dL (ref 8.9–10.3)
Chloride: 104 mmol/L (ref 98–111)
Creatinine, Ser: 1.31 mg/dL — ABNORMAL HIGH (ref 0.44–1.00)
GFR, Estimated: 49 mL/min — ABNORMAL LOW (ref >60)
Glucose, Bld: 79 mg/dL (ref 70–99)
Potassium: 3.6 mmol/L (ref 3.5–5.1)
Sodium: 141 mmol/L (ref 135–145)

## 2023-12-07 LAB — CBC
HCT: 31.2 % — ABNORMAL LOW (ref 36.0–46.0)
Hemoglobin: 9.6 g/dL — ABNORMAL LOW (ref 12.0–15.0)
MCH: 24.4 pg — ABNORMAL LOW (ref 26.0–34.0)
MCHC: 30.8 g/dL (ref 30.0–36.0)
MCV: 79.2 fL — ABNORMAL LOW (ref 80.0–100.0)
Platelets: 370 10*3/uL (ref 150–400)
RBC: 3.94 MIL/uL (ref 3.87–5.11)
RDW: 15.9 % — ABNORMAL HIGH (ref 11.5–15.5)
WBC: 9.6 10*3/uL (ref 4.0–10.5)
nRBC: 0 % (ref 0.0–0.2)

## 2023-12-07 MED ORDER — DIPHENHYDRAMINE HCL 50 MG/ML IJ SOLN
25.0000 mg | Freq: Once | INTRAMUSCULAR | Status: AC
Start: 2023-12-07 — End: 2023-12-07
  Administered 2023-12-07: 25 mg via INTRAVENOUS
  Filled 2023-12-07: qty 1

## 2023-12-07 MED ORDER — HEPARIN SOD (PORK) LOCK FLUSH 100 UNIT/ML IV SOLN: 500.0000 [IU] | Freq: Once | INTRAVENOUS | Status: DC

## 2023-12-07 MED ORDER — LACTATED RINGERS IV BOLUS
500.0000 mL | Freq: Once | INTRAVENOUS | Status: AC
  Administered 2023-12-07: 500 mL via INTRAVENOUS

## 2023-12-07 MED ORDER — GLUCAGON HCL RDNA (DIAGNOSTIC) 1 MG IJ SOLR
1.0000 mg | Freq: Once | INTRAMUSCULAR | Status: AC
  Administered 2023-12-07: 1 mg via INTRAVENOUS
  Filled 2023-12-07: qty 1

## 2023-12-07 MED ORDER — HEPARIN SOD (PORK) LOCK FLUSH 100 UNIT/ML IV SOLN
INTRAVENOUS | Status: AC
  Filled 2023-12-07: qty 5

## 2023-12-07 NOTE — ED Provider Notes (Signed)
 [place] EMERGENCY DEPARTMENT AT [facility] Provider Note   CSN: [id number] Arrival date & time: 12/07/23  1113     History  Chief Complaint  Patient presents with   Pill stuck in throat    [name] is a 53 y.o. female.  HPI 53 year old female history of multiple cancers including Hodgkin's lymphoma, multiple episodes of breast cancer, thyroid cancer, cervical cancer, reports that she is currently on palliative care but has been bouncing back and forth between palliative and hospice.  She comes in today complaining of foreign body sensation in her throat.  She states that she has had multiple pills recently and they feel like they keep getting stuck.  Physical cleared multiple times.  She is able to drink fluids around it.  She took another pill this morning and feels like it is stuck right in her lower throat.  She reports a previous time that she has come to the ED glucagon has worked for this.  It has been food multiple times in the past that have caused her symptoms.  She reports that she is followed by Dr. Allena Katz and he has done multiple upper endoscopies Office records reviewed from January 2025.  At that time she reported continued reflux trouble and globus sensation.  She was still having issues with solid food.  She is on PPI and H2.  He states that he tried to prescribe Dexilant but could not get it authorized.  She has had intermittent dysphonia.  He has not seen GI for several years.  Continues to have some reflux at night.    Home Medications Prior to Admission medications   Medication Sig Start Date End Date Taking? Authorizing Provider  albuterol (PROVENTIL) (2.5 MG/3ML) 0.083% nebulizer solution Take 3 mLs (2.5 mg total) by nebulization every 6 (six) hours as needed for wheezing or shortness of breath. 10/10/23 01/08/24  Luciano Cutter, MD  ALPRAZolam Prudy Feeler) 1 MG tablet 1 tablet daily and 1 tablet PRN no more than 10 days a month. 11/07/23   Mozingo, Thereasa Solo, NP  aspirin EC 81 MG tablet Take 1 tablet (81 mg total) by mouth daily. Swallow whole. Patient taking differently: Take 81 mg by mouth at bedtime. Chewable 07/16/22   Nahser, Deloris Ping, MD  Bismuth Tribromoph-Petrolatum (XEROFORM OCCLUSIVE GAUZE STRIP) PADS Apply 1 each topically as directed. 09/17/22   Rolly Salter, MD  bisoprolol (ZEBETA) 5 MG tablet TAKE 1/2 TABLET BY MOUTH DAILY 10/16/23   Nahser, Deloris Ping, MD  bumetanide (BUMEX) 1 MG tablet TAKE 1 TABLET BY MOUTH TWICE A DAY 11/04/23   Nahser, Deloris Ping, MD  buPROPion (WELLBUTRIN XL) 150 MG 24 hr tablet Take 1 tablet (150 mg total) by mouth daily. 09/19/23   Mozingo, Thereasa Solo, NP  clonazePAM (KLONOPIN) 1 MG tablet Take 1 tablet (1 mg total) by mouth 2 (two) times daily. 11/04/23   Mozingo, Thereasa Solo, NP  cyclobenzaprine (FLEXERIL) 5 MG tablet Take 1 tablet (5 mg total) by mouth 3 (three) times daily as needed for muscle spasms. 06/20/23   Leath-Warren, Sadie Haber, NP  diclofenac (VOLTAREN) 50 MG EC tablet Take 50 mg by mouth 2 (two) times daily. *as needed for migraine, Can repeat if needed* 07/23/23   [provider]  diphenhydrAMINE (BENADRYL) 12.5 MG/5ML elixir Take 10 mLs (25 mg total) by mouth every 6 (six) hours as needed (nausea). 09/17/22   Rolly Salter, MD  doxycycline (VIBRA-TABS) 100 MG tablet Take 1 tablet (100  mg total) by mouth 2 (two) times daily. 07/26/23   Linwood Dibbles, MD  EMGALITY 120 MG/ML SOSY Inject 120 mg into the skin every 28 (twenty-eight) days.    [provider]  EPINEPHRINE 0.3 mg/0.3 mL IJ SOAJ injection INJECT 0.3 MG INTO THE MUSCLE AS NEEDED FOR ANAPHYLAXIS. 04/25/23   Luciano Cutter, MD  escitalopram (LEXAPRO) 20 MG tablet Take 1 tablet (20 mg total) by mouth every evening. 09/19/23   Mozingo, Thereasa Solo, NP  famotidine (PEPCID) 40 MG/5ML suspension Take 2.5 mLs (20 mg total) by mouth daily. 03/30/23 05/16/23  Curatolo, Adam, DO  famotidine (PEPCID) 40 MG/5ML suspension  TAKE 1.3 MLS (10.4 MG TOTAL) BY MOUTH DAILY - DISCARD BOTTLE AFTER 30 DAYS FROM PICKUP 11/25/23   Read Drivers, MD  FLORASTOR 250 MG capsule Take 250 mg by mouth 2 (two) times daily. Mid Morning and Mid Afternoon    [provider]  FLUoxetine (PROZAC) 20 MG capsule TAKE 1 CAPSULE BY MOUTH EVERY DAY 11/29/23   Mozingo, Thereasa Solo, NP  fluticasone-salmeterol (ADVAIR HFA) 230-21 MCG/ACT inhaler INHALE 2 PUFFS INTO THE LUNGS TWICE A DAY 10/01/23   Luciano Cutter, MD  Heparin Na, Pork, Lock Flsh PF (BD HEPARIN POSIFLUSH) 100 UNIT/ML SOLN USE 5 MLS IN PORT A CATH ONCE DAILY AFTER MEDICATION ADMINISTRATION AS A HEPLOCK AS DIRECTED. 09/30/23     hydrocortisone (CORTEF) 10 MG tablet Take 1-2 tablets (10-20 mg total) by mouth See admin instructions. Take 20 mg in the am and 10mg  in the evening. Take after completing prednisone Patient taking differently: Take 10-20 mg by mouth See admin instructions. Take 20 mg in the am at 10 am and 10 mg in the afternoon. Take after completing prednisone 09/29/22   Rolly Salter, MD  hydrOXYzine (ATARAX) 25 MG tablet Take 1 tablet (25 mg total) by mouth every 6 (six) hours as needed for anxiety. 01/24/23   Azucena Fallen, MD  JANUVIA 100 MG tablet Take 100 mg by mouth daily. 08/22/23   [provider]  lamoTRIgine (LAMICTAL) 200 MG tablet TAKE 1 TABLET BY MOUTH EVERYDAY AT BEDTIME 09/19/23   Mozingo, Thereasa Solo, NP  lamoTRIgine (LAMICTAL) 25 MG tablet Take two tablets every morning. 09/19/23   Mozingo, Thereasa Solo, NP  Lancets (ONETOUCH DELICA PLUS [place]) MISC 3 (three) times daily. for testing 06/12/21   [provider]  lansoprazole (PREVACID) 15 MG capsule Take 30 mg by mouth daily at 12 noon.    [provider]  LORazepam (ATIVAN) 0.5 MG tablet Take 1 tablet (0.5 mg total) by mouth 3 (three) times daily as needed for anxiety. Patient taking differently: Place 0.5 mg into feeding tube 3 (three) times daily as  needed for anxiety. [employer] For Nausea 02/11/23   Mozingo, Thereasa Solo, NP  methocarbamol (ROBAXIN) 500 MG tablet Take 1 tablet (500 mg total) by mouth 2 (two) times daily. 09/03/23   Schutt, Edsel Petrin, PA-C  nitroGLYCERIN (NITROSTAT) 0.4 MG SL tablet DISSOLVE 1 TAB UNDER THE TONGUE EVERY 5 MINUTES AS NEEDED FOR CHEST PAIN. MAX OF 3 DOSES, THEN 911. 07/03/23   Nahser, Deloris Ping, MD  NURTEC 75 MG TBDP Take 1 tablet by mouth daily as needed. 09/03/23   [provider]  OLANZapine (ZYPREXA) 5 MG tablet Take 1 tablet (5 mg total) by mouth at bedtime. 09/19/23   Mozingo, Thereasa Solo, NP  [facility] VERIO test strip 3 (three) times daily. for testing 06/12/21   [provider]  Oxycodone HCl 10 MG TABS Take 1 tablet by mouth every 6 (six) hours as needed (pain).    [provider]  OXYGEN Inhale 4-5 L/min into the lungs continuous.    [provider]  pantoprazole (PROTONIX) 40 MG tablet Take 1 tablet (40 mg total) by mouth daily. 10/28/23   Read Drivers, MD  PROAIR HFA 108 [phone] Base) MCG/ACT inhaler Inhale 2 puffs into the lungs every 4 (four) hours as needed for wheezing or shortness of breath.    [provider]  QVAR REDIHALER 80 MCG/ACT inhaler Inhale 1 puff into the lungs 2 (two) times daily. 05/10/23   [provider]  RABEprazole (ACIPHEX) 20 MG tablet Take 1 tablet (20 mg total) by mouth daily. 09/02/23   Read Drivers, MD  rosuvastatin (CRESTOR) 10 MG tablet Take 10 mg by mouth in the morning. Mid morning 02/28/18   [provider]  sacubitril-valsartan (ENTRESTO) 24-26 MG TAKE 1 TABLET BY MOUTH TWICE A DAY 07/03/23   Nahser, Deloris Ping, MD  Sodium Chloride Flush (NORMAL SALINE FLUSH) 0.9 % SOLN Use as directed for catheter maintenance 11/07/23     Sodium Chloride Flush (NORMAL SALINE FLUSH) 0.9 % SOLN Use as directed for central venous catheter maintenance 01/31/23     sucralfate (CARAFATE) 1 GM/10ML suspension Take 1 g by mouth daily  as needed (as directed for ulcers).    [provider]  SYNTHROID 100 MCG tablet Take 1 tablet (100 mcg total) by mouth every morning. Patient taking differently: Take 100 mcg by mouth every morning. * Must be name brand* 10/19/21   Serena Croissant, MD  Tiotropium Bromide Monohydrate (SPIRIVA RESPIMAT) 1.25 MCG/ACT AERS Inhale 2 puffs into the lungs daily. 05/16/23   Luciano Cutter, MD  topiramate (TOPAMAX) 50 MG tablet Take 150 mg by mouth at bedtime.  Patient not taking: Reported on 10/28/2023 12/25/19   [provider]  zolpidem (AMBIEN CR) 12.5 MG CR tablet TAKE ONE TABLET BY MOUTH AT BEDTIME AS NEEDED FOR SLEEP. 05/14/23     zonisamide (ZONEGRAN) 100 MG capsule Take 100 mg by mouth daily. 10/09/23 10/08/24  [provider]      Allergies    Ferrous bisglycinate chelate [iron], Mushroom extract complex (obsolete), Na ferric gluc cplx in sucrose, Cymbalta [duloxetine hcl], Hydromorphone, Ondansetron hcl, Promethazine, Succinylcholine, Buprenorphine hcl, Compazine, Duloxetine, Metoclopramide, Morphine and codeine, Ondansetron, Promethazine hcl, and Tegaderm ag mesh [silver]    Review of Systems   Review of Systems  Physical Exam Updated Vital Signs BP 126/76   Pulse 76   Temp 98 F (36.7 C) (Oral)   Resp 20   LMP  (LMP Unknown)   SpO2 100%  Physical Exam Vitals reviewed.  HENT:     Head: Normocephalic.     Right Ear: External ear normal.     Left Ear: External ear normal.     Nose: Nose normal.     Mouth/Throat:     Pharynx: Oropharynx is clear.  Eyes:     Pupils: Pupils are equal, round, and reactive to light.  Cardiovascular:     Rate and Rhythm: Normal rate and regular rhythm.     Pulses: Normal pulses.     Comments: Port in right chest wall with no surrounding erythema or tenderness Pulmonary:     Effort: Pulmonary effort is normal.  Abdominal:     General: Abdomen is flat.     Palpations: Abdomen is soft.  Musculoskeletal:  General: Normal  range of motion.     Cervical back: Normal range of motion.  Skin:    General: Skin is warm and dry.     Capillary Refill: Capillary refill takes less than 2 seconds.  Neurological:     General: No focal deficit present.     Mental Status: She is alert.  Psychiatric:        Mood and Affect: Mood normal.     ED Results / Procedures / Treatments   Labs (all labs ordered are listed, but only abnormal results are displayed) Labs Reviewed  CBC - Abnormal; Notable for the following components:      Result Value   Hemoglobin 9.6 (*)    HCT 31.2 (*)    MCV 79.2 (*)    MCH 24.4 (*)    RDW 15.9 (*)    All other components within normal limits  BASIC METABOLIC PANEL - Abnormal; Notable for the following components:   BUN 28 (*)    Creatinine, Ser 1.31 (*)    GFR, Estimated 49 (*)    All other components within normal limits    EKG None  Radiology CT SOFT TISSUE NECK WO CONTRAST Result Date: 12/07/2023 CLINICAL DATA:  Pill stuck in throat. Patient states that fills have been getting stuck in her throat for 2-3 days but she has been able to extract them. She swallowed a pill this morning and sent stable was stuck in throat 12 was unable to extract the pill. EXAM: CT NECK WITHOUT CONTRAST TECHNIQUE: Multidetector CT imaging of the neck was performed following the standard protocol without intravenous contrast. RADIATION DOSE REDUCTION: This exam was performed according to the departmental dose-optimization program which includes automated exposure control, adjustment of the mA and/or kV according to patient size and/or use of iterative reconstruction technique. COMPARISON:  CT neck with contrast 05/20/2015 FINDINGS: Pharynx and larynx: No focal mucosal or submucosal lesions are present. Nasopharynx is clear. Soft palate and tongue base are within normal limits. Vallecula and epiglottis are within normal limits. Aryepiglottic folds and piriform sinuses are clear. Vocal cords are midline and  symmetric. Trachea is clear. Salivary glands: The submandibular and parotid glands and ducts are within normal limits. Benign intraparotid lymph nodes are present bilaterally. Thyroid: Thyroid is markedly atrophic. No focal lesions are present. Lymph nodes: No significant cervical adenopathy is present. Vascular: Atherosclerotic calcifications are present carotid bifurcations bilaterally without definite stenosis. Atherosclerotic calcifications are also present at the aortic arch. Limited intracranial: Within normal limits. Visualized orbits: The globes and orbits are within normal limits. Mastoids and visualized paranasal sinuses: The paranasal sinuses and mastoid air cells are clear. Skeleton: The vertebral body heights and alignment are normal. No focal osseous lesions are present. Upper chest: Ill-defined airspace opacities are present the right upper lobe. Left lung is clear. IMPRESSION: 1. No acute or focal lesion to explain the patient's symptoms. No radiopaque foreign body or soft tissue nodule to account for a medicine tablet or capsule in the neck. 2. Ill-defined airspace opacities in the right upper lobe raise concern for pneumonia. Electronically Signed   By: Marin Roberts M.D.   On: 12/07/2023 13:43    Procedures Procedures    Medications Ordered in ED Medications  heparin lock flush 100 unit/mL (0 Units Intracatheter Hold 12/07/23 1327)  glucagon (human recombinant) (GLUCAGEN) injection 1 mg (1 mg Intravenous Given 12/07/23 1257)  lactated ringers bolus 500 mL (500 mLs Intravenous New Bag/Given 12/07/23 1334)  diphenhydrAMINE (BENADRYL) injection 25  mg (25 mg Intravenous Given 12/07/23 1303)    ED Course/ Medical Decision Making/ A&P                                 Medical Decision Making Amount and/or Complexity of Data Reviewed Labs: ordered. Radiology: ordered.  Risk Prescription drug management.   Patient with difficulty swallowing pills.  No obvious foreign body noted on  CT.  Glucagon with some improvement of symptoms.  2  largest pills are her potassium and Lamictal.  Discussed with pharmacist and she can crush her Lamictal.  I can also give her powder for her potassium.  She has her ENT doctor to follow-up with.  She appears stable for discharge.        Final Clinical Impression(s) / ED Diagnoses Final diagnoses:  Dysphagia, unspecified type    Rx / DC Orders ED Discharge Orders     None         Margarita Grizzle, MD 12/07/23 1418

## 2023-12-07 NOTE — ED Triage Notes (Signed)
 Pt caox4, ambulatory, O2 dependent at baseline on 5L via [place]. Approx 2-3 nights ago d/t chronic diff swallowing she began getting PO meds stuck in throat but was able to get them out. However this morning she took meds and feels that there is a pill still stuck in the throat. Pain when trying to swallow. Denies new SOB. Cancer, not currently on chemo.

## 2023-12-07 NOTE — Discharge Instructions (Signed)
 You may crush your Lamictal and potassium. Please follow-up with your doctor next week. Avoid eating large chunks of food or anything that may get caught in your throat. You may return if you are having worsening symptoms anytime

## 2023-12-12 ENCOUNTER — Encounter: Payer: Self-pay | Admitting: Hematology and Oncology

## 2023-12-12 ENCOUNTER — Ambulatory Visit (INDEPENDENT_AMBULATORY_CARE_PROVIDER_SITE_OTHER): Payer: Medicare Other | Admitting: Psychiatry

## 2023-12-12 ENCOUNTER — Other Ambulatory Visit (HOSPITAL_COMMUNITY): Payer: Self-pay

## 2023-12-12 DIAGNOSIS — F411 Generalized anxiety disorder: Secondary | ICD-10-CM | POA: Diagnosis not present

## 2023-12-12 NOTE — Progress Notes (Signed)
 [facility] Counselor/Therapist Progress Note  Patient ID: [name], MRN: [id number],    Date: 12/12/2023  Time Spent: 48 minutes   Treatment Type: Individual Therapy  Reported Symptoms:  anxiety, depression, resentful at time, hope can be difficult   Mental Status Exam:  Appearance:   Casual     Behavior:  Appropriate, Sharing, and Motivated  Motor:  Normal  Speech/Language:   Clear and Coherent  Affect:  Depressed and anxious  Mood:  anxious and depressed  Thought process:  goal directed  Thought content:    WNL  Sensory/Perceptual disturbances:    WNL  Orientation:  oriented to person, place, time/date, situation, day of week, month of year, year, and stated date of December 12, 2023  Attention:  Fair  Concentration:  Good and Fair  Memory:  Affected by her current health issues  Fund of knowledge:   Fair  Insight:    Good and Fair  Judgment:   Good and Fair  Impulse Control:  Good and Fair   Risk Assessment: Danger to Self:  No Self-injurious Behavior: No Danger to Others: No Duty to Warn:no Physical Aggression / Violence:No  Access to Firearms a concern: No  Gang Involvement:No   Subjective:   Patient today came inside for session here at our office versus her usual virtual Video sessions, due to a Medicare requirement. Today focusing on symptoms of anxiety, depression, resentment, and trying to have a sense of hopefulness, in addition to her cancer. Needing to process some of her feelings in session today re: family and some decisions that were made by others but impacts patient also. Some tearfulness, anger, and frustration patient was able to vent and process in session today, and seemed to help in her feeling heard and understood. Also discussed and worked on some of her communication with husband and 30 yr old son. Less anger reported. Seemed to have some relief after talking in session today, actually made some comments to that effect but also added in  some humor which she has maintained at times even in her serious illness.  Continues to feel supported in her expression of sadness, frustration, fears, anger, which she reports needs her to be able to feel less alone and less tension as she continues on her journey with cancer.  Interventions: Cognitive Behavioral Therapy and Ego-Supportive  Long-term goal:  Patient will confront her anxiety, fears, anger reported re: her illness and use time in therapy and beyond to work on this, while receiving feedback and support from therapist. Help her determine what she feels is realistic for her considering her health challenges and difficult prognosis.  Short-term goal: Participate in therapy and initiate communication of needs and desires at this point in her struggle physically.  Strategies: Share and talk through anxiety, anger, and depression that are experienced and work to find and use helpful coping tools to better manage those feelings.  Diagnosis:   ICD-10-CM   1. Generalized anxiety disorder  F41.1      Plan:  Patient actively participating in session today as she wanted to come in if she was able physically able, so as to satisfy the Medicare requirement deadline for an in person visit this calendar year.  Stated anxiety was her main symptom today along with depression, anger, fears, resentment, and struggling with hope which we addressed some today in session.  Actively participated in session and continued addressing and talking through difficult thoughts and feelings related to her cancer  and her prognosis.  Does state that her sessions are helpful to her and being able to share her openly and not hold back, as she continues to cope with her illness and other issues within the family that involve her.  This patient has made progress and is needing to continue her work with goal-directed behaviors so as to enable herself to get the care and attention she needs at this point in her life while in  palliative/hospice care and needing to manage the challenges she experiences with her health and emotional status.  Goal review and progress/challenges noted with patient.  Next appointment within 2 to 3 weeks.   Mathis Fare, LCSW

## 2023-12-13 ENCOUNTER — Other Ambulatory Visit (HOSPITAL_COMMUNITY): Payer: Self-pay

## 2023-12-13 ENCOUNTER — Other Ambulatory Visit (HOSPITAL_BASED_OUTPATIENT_CLINIC_OR_DEPARTMENT_OTHER): Payer: Self-pay | Admitting: Pulmonary Disease

## 2023-12-13 DIAGNOSIS — J209 Acute bronchitis, unspecified: Secondary | ICD-10-CM

## 2023-12-18 NOTE — Progress Notes (Unsigned)
 Chief Complaint:dysphagia, LPR, globus sensation Primary GI Doctor:***  HPI:  Patient is a  53  year old female patient with past medical history of Tyasia Packard is a 53 y.o. female with PMH: chronic lymphedema, hx cushings secondary to pituitary adenoma s/p resection and gamma knife complicated by adrenal insufficiency, history of Hodgkin's at age 54 s/p chemoradiation, breast cancer s/p chemotherapy and bilateral mastectomy in 2000 which is in remission, cervical cancer s/p LEEP 2002, ovarian tumor "precancerous" s/p TH and left oophorectomy in 2011 and right in 2016, hx postablative hypothyroidism, ADHD, anxiety/depression and chronic hypoxemic respiratory failure secondary to unknown etiology, who was referred to me by Toppin, Lillia Dallas, MD on 10/28/23 for a complaint of *** .    MBS normal; r/o esophageal etiology ***  Interval History  Patient admits/denies GERD Patient taking pantoprazole and Pepcid Patient admits/denies dysphagia Patient admits/denies nausea, vomiting, or weight loss  Patient admits/denies altered bowel habits Patient admits/denies abdominal pain Patient admits/denies rectal bleeding   Denies/Admits alcohol Denies/Admits smoking Denies/Admits NSAID use. Denies/Admits they are on blood thinners.  Patients last colonoscopy Patients last EGD  Patient's family history includes  Wt Readings from Last 3 Encounters:  10/28/23 164 lb (74.4 kg)  09/11/23 164 lb 9.6 oz (74.7 kg)  07/26/23 159 lb (72.1 kg)     Past Medical History:  Diagnosis Date   Addison's disease (HCC)    Adrenal insufficiency (HCC)    Anemia    Anxiety    Aortic stenosis    Aortic stenosis    Appendicitis 12/19/09   Appendicitis    Breast cancer (HCC)    STATUS POST BILATERAL MASTECTOMY. STATUS POST RECONSTRUCTION. SHE HAD SILICONE BREAST IMPLANTS AND THE LEFT IMPLANT IS LEAKING SLIGHTLY   Cellulitis of right middle finger 11/07/2018   Cervical cancer (HCC) 12/23/2018   Chest pain     CHF with right heart failure (HCC) 04/17/2017   Chronic respiratory failure with hypoxia (HCC) 12/23/2018   Cough variant asthma 04/13/2019   Depression    GERD (gastroesophageal reflux disease)    takes Dexilant and carafate and gi coctail    Headache    migraines on a daily and monthly regimen    Heart murmur    History of kidney stones    Hodgkin lymphoma (HCC)    STATUS POST MANTLE RADIATION   Hodgkin's lymphoma (HCC)    1987   Hypertension    Hypoxia    Necrotizing fasciitis (HCC) 12/23/2018   Non-ischemic cardiomyopathy (HCC)    Osteoporosis    Palpitations    Pituitary adenoma (HCC) 12/23/2018   Pneumonia    PONV (postoperative nausea and vomiting)    Pre-diabetes    per pt; no meds   Pulmonary hypertension (HCC) 12/23/2018   Raynaud phenomenon    Right heart failure (HCC) 04/17/2017   Seizures (HCC)    last febrile seizure was approx 3 weeks ago per report on 12/01/2020   Sleep apnea    upcoming sleep study per pt    Supplemental oxygen dependent    3 liters   SVT (supraventricular tachycardia) (HCC)    Tachycardia    Thyroid cancer (HCC)    STATUS POST SURGICAL REMOVAL-CURRENT ON THYROID REPLACEMENT    Past Surgical History:  Procedure Laterality Date   ABDOMINAL HYSTERECTOMY     AMPUTATION Left 01/30/2019   Procedure: Left Index finger amputation with flap reconstruction and repair reconstruction;  Surgeon: Dominica Severin, MD;  Location: [facility] OR;  Service: Orthopedics;  Laterality: Left;   APPENDECTOMY     breast implants and removal      breast implants but leaking      CARDIAC CATHETERIZATION  05/18/09   NORMAL CATH   COLONOSCOPY     hx of chemotherapy      hx of radiation therapy      I & D EXTREMITY Left 12/23/2018   Procedure: IRRIGATION AND DEBRIDEMENT HAND / INDEX FINGER;  Surgeon: Dominica Severin, MD;  Location: [facility] OR;  Service: Orthopedics;  Laterality: Left;   IR CV LINE INJECTION  03/22/2022   IR FLUORO GUIDE CV LINE RIGHT  10/09/2022   IR FLUORO GUIDE CV  LINE RIGHT  01/18/2023   IR FLUORO GUIDE CV LINE RIGHT  03/26/2023   IR FLUORO GUIDE CV LINE RIGHT  10/10/2023   IR IMAGING GUIDED PORT INSERTION  05/06/2020   IR IMAGING GUIDED PORT INSERTION  12/04/2021   IR REMOVAL TUN ACCESS W/ PORT W/O FL MOD SED  04/27/2020   IR REMOVAL TUN ACCESS W/ PORT W/O FL MOD SED  12/04/2021   IR REMOVAL TUN ACCESS W/ PORT W/O FL MOD SED  03/30/2022   IR US GUIDE VASC ACCESS RIGHT  10/09/2022   KIDNEY STONE SURGERY     LUMBAR PUNCTURE W/ INTRATHECAL CHEMOTHERAPY     MASTECTOMY     PITUITARY SURGERY     RIGHT/LEFT HEART CATH AND CORONARY ANGIOGRAPHY N/A 04/02/2018   Procedure: RIGHT/LEFT HEART CATH AND CORONARY ANGIOGRAPHY;  Surgeon: Kathleene Hazel, MD;  Location: [facility];  Service: Cardiovascular;  Laterality: N/A;   RIGHT/LEFT HEART CATH AND CORONARY ANGIOGRAPHY N/A 08/31/2022   Procedure: RIGHT/LEFT HEART CATH AND CORONARY ANGIOGRAPHY;  Surgeon: Dolores Patty, MD;  Location: [facility];  Service: Cardiovascular;  Laterality: N/A;   TOOTH EXTRACTION N/A 12/05/2020   Procedure: DENTAL RESTORATION/EXTRACTIONS;  Surgeon: Lovena Neighbours, MD;  Location: [facility];  Service: Oral Surgery;  Laterality: N/A;   TOTAL THYROIDECTOMY     VIDEO BRONCHOSCOPY Bilateral 11/14/2018   Procedure: VIDEO BRONCHOSCOPY WITHOUT FLUORO;  Surgeon: Luciano Cutter, MD;  Location: [facility];  Service: Cardiopulmonary;  Laterality: Bilateral;   VIDEO BRONCHOSCOPY WITH ENDOBRONCHIAL ULTRASOUND N/A 11/19/2018   Procedure: VIDEO BRONCHOSCOPY WITH ENDOBRONCHIAL ULTRASOUND;  Surgeon: Luciano Cutter, MD;  Location: [facility] OR;  Service: Thoracic;  Laterality: N/A;    Current Outpatient Medications  Medication Sig Dispense Refill   albuterol (PROVENTIL) (2.5 MG/3ML) 0.083% nebulizer solution INHALE 3 ML BY NEBULIZATION EVERY 6 HOURS AS NEEDED FOR WHEEZING OR SHORTNESS OF BREATH 225 mL 1   ALPRAZolam (XANAX) 1 MG tablet 1 tablet daily and 1 tablet PRN no more than 10 days  a month. 40 tablet 0   aspirin EC 81 MG tablet Take 1 tablet (81 mg total) by mouth daily. Swallow whole. (Patient taking differently: Take 81 mg by mouth at bedtime. Chewable) 90 tablet 3   Bismuth Tribromoph-Petrolatum (XEROFORM OCCLUSIVE GAUZE STRIP) PADS Apply 1 each topically as directed. 50 each 1   bisoprolol (ZEBETA) 5 MG tablet TAKE 1/2 TABLET BY MOUTH DAILY 8 tablet 0   bumetanide (BUMEX) 1 MG tablet TAKE 1 TABLET BY MOUTH TWICE A DAY 30 tablet 0   buPROPion (WELLBUTRIN XL) 150 MG 24 hr tablet Take 1 tablet (150 mg total) by mouth daily. 90 tablet 0   clonazePAM (KLONOPIN) 1 MG tablet Take 1 tablet (1 mg total) by mouth 2 (two) times daily. 60 tablet 2  cyclobenzaprine (FLEXERIL) 5 MG tablet Take 1 tablet (5 mg total) by mouth 3 (three) times daily as needed for muscle spasms. 20 tablet 0   diclofenac (VOLTAREN) 50 MG EC tablet Take 50 mg by mouth 2 (two) times daily. *as needed for migraine, Can repeat if needed*     diphenhydrAMINE (BENADRYL) 12.5 MG/5ML elixir Take 10 mLs (25 mg total) by mouth every 6 (six) hours as needed (nausea). 120 mL 0   doxycycline (VIBRA-TABS) 100 MG tablet Take 1 tablet (100 mg total) by mouth 2 (two) times daily. 14 tablet 0   EMGALITY 120 MG/ML SOSY Inject 120 mg into the skin every 28 (twenty-eight) days.     EPINEPHRINE 0.3 mg/0.3 mL IJ SOAJ injection INJECT 0.3 MG INTO THE MUSCLE AS NEEDED FOR ANAPHYLAXIS. 2 each 1   escitalopram (LEXAPRO) 20 MG tablet Take 1 tablet (20 mg total) by mouth every evening. 30 tablet 5   famotidine (PEPCID) 40 MG/5ML suspension Take 2.5 mLs (20 mg total) by mouth daily. 75 mL 0   famotidine (PEPCID) 40 MG/5ML suspension TAKE 1.3 MLS (10.4 MG TOTAL) BY MOUTH DAILY - DISCARD BOTTLE AFTER 30 DAYS FROM PICKUP 50 mL 0   FLORASTOR 250 MG capsule Take 250 mg by mouth 2 (two) times daily. Mid Morning and Mid Afternoon     FLUoxetine (PROZAC) 20 MG capsule TAKE 1 CAPSULE BY MOUTH EVERY DAY 90 capsule 0   fluticasone-salmeterol  (ADVAIR HFA) 230-21 MCG/ACT inhaler INHALE 2 PUFFS INTO THE LUNGS TWICE A DAY 12 g 3   Heparin Na, Pork, Lock Flsh PF (BD HEPARIN POSIFLUSH) 100 UNIT/ML SOLN USE 5 MLS IN PORT A CATH ONCE DAILY AFTER MEDICATION ADMINISTRATION AS A HEPLOCK AS DIRECTED. 150 mL 3   hydrocortisone (CORTEF) 10 MG tablet Take 1-2 tablets (10-20 mg total) by mouth See admin instructions. Take 20 mg in the am and 10mg  in the evening. Take after completing prednisone (Patient taking differently: Take 10-20 mg by mouth See admin instructions. Take 20 mg in the am at 10 am and 10 mg in the afternoon. Take after completing prednisone)     hydrOXYzine (ATARAX) 25 MG tablet Take 1 tablet (25 mg total) by mouth every 6 (six) hours as needed for anxiety. 30 tablet 0   JANUVIA 100 MG tablet Take 100 mg by mouth daily.     lamoTRIgine (LAMICTAL) 200 MG tablet TAKE 1 TABLET BY MOUTH EVERYDAY AT BEDTIME 90 tablet 0   lamoTRIgine (LAMICTAL) 25 MG tablet Take two tablets every morning. 180 tablet 0   Lancets (ONETOUCH DELICA PLUS LANCET33G) MISC 3 (three) times daily. for testing     lansoprazole (PREVACID) 15 MG capsule Take 30 mg by mouth daily at 12 noon.     LORazepam (ATIVAN) 0.5 MG tablet Take 1 tablet (0.5 mg total) by mouth 3 (three) times daily as needed for anxiety. (Patient taking differently: Place 0.5 mg into feeding tube 3 (three) times daily as needed for anxiety. Access Port For Nausea) 12 tablet 0   methocarbamol (ROBAXIN) 500 MG tablet Take 1 tablet (500 mg total) by mouth 2 (two) times daily. 20 tablet 0   nitroGLYCERIN (NITROSTAT) 0.4 MG SL tablet DISSOLVE 1 TAB UNDER THE TONGUE EVERY 5 MINUTES AS NEEDED FOR CHEST PAIN. MAX OF 3 DOSES, THEN 911. 75 tablet 0   NURTEC 75 MG TBDP Take 1 tablet by mouth daily as needed.     OLANZapine (ZYPREXA) 5 MG tablet Take 1 tablet (5 mg total)  by mouth at bedtime. 30 tablet 2   ONETOUCH VERIO test strip 3 (three) times daily. for testing     Oxycodone HCl 10 MG TABS Take 1 tablet by  mouth every 6 (six) hours as needed (pain).     OXYGEN Inhale 4-5 L/min into the lungs continuous.     pantoprazole (PROTONIX) 40 MG tablet Take 1 tablet (40 mg total) by mouth daily. 30 tablet 3   PROAIR HFA 108 (90 Base) MCG/ACT inhaler Inhale 2 puffs into the lungs every 4 (four) hours as needed for wheezing or shortness of breath.     QVAR REDIHALER 80 MCG/ACT inhaler Inhale 1 puff into the lungs 2 (two) times daily.     RABEprazole (ACIPHEX) 20 MG tablet Take 1 tablet (20 mg total) by mouth daily. 30 tablet 0   rosuvastatin (CRESTOR) 10 MG tablet Take 10 mg by mouth in the morning. Mid morning     sacubitril-valsartan (ENTRESTO) 24-26 MG TAKE 1 TABLET BY MOUTH TWICE A DAY 60 tablet 5   Sodium Chloride Flush (NORMAL SALINE FLUSH) 0.9 % SOLN Use as directed for catheter maintenance 600 mL 3   Sodium Chloride Flush (NORMAL SALINE FLUSH) 0.9 % SOLN Use as directed for central venous catheter maintenance 600 mL 3   sucralfate (CARAFATE) 1 GM/10ML suspension Take 1 g by mouth daily as needed (as directed for ulcers).     SYNTHROID 100 MCG tablet Take 1 tablet (100 mcg total) by mouth every morning. (Patient taking differently: Take 100 mcg by mouth every morning. * Must be name brand*)     Tiotropium Bromide Monohydrate (SPIRIVA RESPIMAT) 1.25 MCG/ACT AERS Inhale 2 puffs into the lungs daily. 4 g 5   topiramate (TOPAMAX) 50 MG tablet Take 150 mg by mouth at bedtime.  (Patient not taking: Reported on 10/28/2023)     zolpidem (AMBIEN CR) 12.5 MG CR tablet TAKE ONE TABLET BY MOUTH AT BEDTIME AS NEEDED FOR SLEEP. 90 tablet 0   zonisamide (ZONEGRAN) 100 MG capsule Take 100 mg by mouth daily.     No current facility-administered medications for this visit.    Allergies as of 12/19/2023 - Review Complete 12/07/2023  Allergen Reaction Noted   Ferrous bisglycinate chelate [iron] Anaphylaxis and Other (See Comments) 07/15/2020   Mushroom extract complex (obsolete) Anaphylaxis 04/23/2019   Na ferric gluc  cplx in sucrose Anaphylaxis 11/16/2014   Cymbalta [duloxetine hcl] Swelling and Anxiety 02/06/2011   Hydromorphone Other (See Comments) 05/08/2016   Ondansetron hcl Other (See Comments) 05/22/2022   Promethazine Other (See Comments) 08/03/2021   Succinylcholine Other (See Comments) 12/23/2018   Buprenorphine hcl Hives 02/03/2015   Compazine Other (See Comments) 02/06/2011   Duloxetine  02/06/2011   Metoclopramide Other (See Comments) 05/08/2016   Morphine and codeine Hives 02/06/2011   Ondansetron Hives and Rash 02/03/2015   Promethazine hcl Hives 02/06/2011   Tegaderm ag mesh [silver] Rash 02/06/2011    Family History  Family history unknown: Yes    Review of Systems:    Constitutional: No weight loss, fever, chills, weakness or fatigue HEENT: Eyes: No change in vision               Ears, Nose, Throat:  No change in hearing or congestion Skin: No rash or itching Cardiovascular: No chest pain, chest pressure or palpitations   Respiratory: No SOB or cough Gastrointestinal: See HPI and otherwise negative Genitourinary: No dysuria or change in urinary frequency Neurological: No headache, dizziness or syncope Musculoskeletal: No  new muscle or joint pain Hematologic: No bleeding or bruising Psychiatric: No history of depression or anxiety    Physical Exam:  Vital signs: LMP  (LMP Unknown)   Constitutional:   Pleasant *** female appears to be in NAD, Well developed, Well nourished, alert and cooperative Head:  Normocephalic and atraumatic. Eyes:   PEERL, EOMI. No icterus. Conjunctiva pink. Ears:  Normal auditory acuity. Neck:  Supple Throat: Oral cavity and pharynx without inflammation, swelling or lesion.  Respiratory: Respirations even and unlabored. Lungs clear to auscultation bilaterally.   No wheezes, crackles, or rhonchi.  Cardiovascular: Normal S1, S2. Regular rate and rhythm. No peripheral edema, cyanosis or pallor.  Gastrointestinal:  Soft, nondistended, nontender.  No rebound or guarding. Normal bowel sounds. No appreciable masses or hepatomegaly. Rectal:  Not performed.  Anoscopy: Msk:  Symmetrical without gross deformities. Without edema, no deformity or joint abnormality.  Neurologic:  Alert and  oriented x4;  grossly normal neurologically.  Skin:   Dry and intact without significant lesions or rashes. Psychiatric: Oriented to person, place and time. Demonstrates good judgement and reason without abnormal affect or behaviors.  RELEVANT LABS AND IMAGING: CBC    Latest Ref Rng & Units 12/07/2023   12:59 PM 09/03/2023    1:15 PM 07/26/2023    2:33 PM  CBC  WBC 4.0 - 10.5 K/uL 9.6  13.6  11.4   Hemoglobin 12.0 - 15.0 g/dL 9.6  9.6  53.6   Hematocrit 36.0 - 46.0 % 31.2  31.1  32.7   Platelets 150 - 400 K/uL 370  332  314      CMP     Latest Ref Rng & Units 12/07/2023   12:59 PM 09/03/2023    1:15 PM 07/26/2023    2:33 PM  CMP  Glucose 70 - 99 mg/dL 79  644  034   BUN 6 - 20 mg/dL 28  28  30    Creatinine 0.44 - 1.00 mg/dL 7.42  5.95  6.38   Sodium 135 - 145 mmol/L 141  141  140   Potassium 3.5 - 5.1 mmol/L 3.6  3.2  3.2   Chloride 98 - 111 mmol/L 104  105  103   CO2 22 - 32 mmol/L 28  26  24    Calcium 8.9 - 10.3 mg/dL 9.1  8.4  9.1   Total Protein 6.5 - 8.1 g/dL  6.8  7.5   Total Bilirubin <1.2 mg/dL  0.3  0.6   Alkaline Phos 38 - 126 U/L  56  56   AST 15 - 41 U/L  18  20   ALT 0 - 44 U/L  25  30      Lab Results  Component Value Date   TSH 0.508 04/23/2023   04/25/23 Echo-Left ventricular ejection fraction, by estimation, is 60 to 65%.  09/16/23 MBSS-  Clinical Impression: Patient presents with a normal oropharyngeal swallow however she does present with a suspected primary esophageal dysphagia as per this modified barium swallow study. 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 Thicker barium consistencies (honey thick, pudding thick and mechanical soft solids did transit a little  slowly through PES and upper esophagus but no retrograde movement or stasis of barium visualized in upper esophagus or at level of PES. Patient attempted to swallow barium tablet with plain water but she gagged while tablet still in mouth and spit it out. She reports this is something that can occur without warning  when she is eating or taking medications. No aspiration or penetration observed and oral and pharyngeal structures all appeared to be [facility]. SLP recommended patient see an outpatient SLP for help with reintroducing solid foods into her diet.  11/15/2010 EGD  Assessment: 1. ***  Plan: 1. ***   Thank you for the courtesy of this consult. Please call me with any questions or concerns.   Tsuneo Faison, FNP-C [facility] 12/18/2023, 4:47 PM  Cc: [place] Lillia Dallas, MD

## 2023-12-18 NOTE — H&P (View-Only) (Signed)
 Chief Complaint: dysphagia, LPR, globus sensation Primary GI Doctor: Dr. Barron Alvine   HPI:  Patient is a 53 year old female patient with past medical history of chronic lymphedema, hx cushings secondary to pituitary adenoma s/p resection and gamma knife complicated by adrenal insufficiency, history of Hodgkin's at age 29 s/p chemoradiation, breast cancer s/p chemotherapy and bilateral mastectomy in 2000 which is in remission, cervical cancer s/p LEEP 2002, ovarian tumor "precancerous" s/p TH and left oophorectomy in 2011 and right in 2016, hx postablative hypothyroidism, ADHD, anxiety/depression and chronic hypoxemic respiratory failure secondary to unknown etiology (%L 22 at home), history of DVTs (will go on short term heparin or lovenox), who was referred to me by Toppin, Lillia Dallas, MD on 10/28/23 for a complaint of dysphagia, LPR, globus sensation .   On 12/07/2023 patient went to [facility] ED for complaints of pill stuck in her throat.No obvious foreign body noted on CT. She is on a PPI and H2. We tried to prescribe dexilant since it worked for her prior, but insurance did not authorize.   On 10/28/2023 patient seen by ENT Dr. Allena Katz for evaluation of foreign body sensation in throat. She reports continued reflux trouble and globus sensation. TFL and MBS unremarkable. Possible esophageal dysmotility mentioned. Refer to GI for ruling out dysmotility; Trudie Cervantes benefit from EGD but will defer to their expertise. --She has previously seen Dr. Delford Field for aspiration and voice loss, and she had a normal oropharyngeal swallowing on FEES. Prior saw Dr. Delford Field in 2016 for VF nodule and globus and swallowing.  --She was followed by Dr. Allena Katz and he has done multiple upper endoscopies.  On 12/18/23 patient seen by cardiology Dr. Elease Hashimoto.  Interval History     Patient presents with main complaint of esophageal dysphagia,accompanied by her husband. She reports she is currently on a pureed diet due to  difficulties with solids. She continues to have issues with swallowing pills that have not been changed to liquid form. If she eats solid foods they come right back up. She will have pain and burning with swallowing in her eosphagus as well as her chest with both solids and liquids. She reports she use to weigh 198lbs last year and has lost about 30lbs.  She takes daily benadryl two to three times daily for nausea, states she has allergy to other antiemetics.  Patient has history of GERD and was on Dexilant 60 mg po daily for 10 years which provided her relief. Unfortunately her insurance stopped covering it therefore she was switched and currently taking pantoprazole 40 mg BID, Acidphex 15mg  BID, Rabeprazole 20 mg once, and Pepcid 40mg  liquid twice daily. Patient also taking Carafate 1GM and Gi cocktail prn.    She has had endoscopies and swallow study in the past. She states they also attempted to do esophageal manometry and had complications with nose irritation bleeding, was not repeated.     She has been taking florastor twice daily to regulate her bowels. She reports bowel movement every 1-2 days. No straining. She states she has intermittently BRB with bowel movements, but it has been several months since last episode.  She has RIJ port for IV access. Patient is currently on 5L N/C continuous. No alcohol use. Nonsmoker. Patient on baby aspirin 81mg  po daily. Patients last colonoscopy approximately was in 08/2020 at [facility] and per patient had colonic polyps that were precancerous. Patients last EGD approximately was in 08/2020 at [facility], per patient she was suppose to repeat in 2 years.  Procedures with Dr. Lavona Mound. Patient's family history includes adopted father with Barretts and esophageal CA.  Wt Readings from Last 3 Encounters:  12/19/23 167 lb (75.8 kg)  10/28/23 164 lb (74.4 kg)  09/11/23 164 lb 9.6 oz (74.7 kg)    Past Medical History:  Diagnosis Date   Addison's disease (HCC)    Adrenal  insufficiency (HCC)    Anemia    Anxiety    Aortic stenosis    Aortic stenosis    Appendicitis 12/19/2009   Appendicitis    Breast cancer (HCC)    STATUS POST BILATERAL MASTECTOMY. STATUS POST RECONSTRUCTION. SHE HAD SILICONE BREAST IMPLANTS AND THE LEFT IMPLANT IS LEAKING SLIGHTLY   Cellulitis of right middle finger 11/07/2018   Cervical cancer (HCC) 12/23/2018   Chest pain    CHF with right heart failure (HCC) 04/17/2017   Chronic respiratory failure with hypoxia (HCC) 12/23/2018   CKD (chronic kidney disease) stage 3, GFR 30-59 ml/min (HCC)    Cough variant asthma 04/13/2019   Depression    Functional neurological symptom disorder with mixed symptoms 2023   GERD (gastroesophageal reflux disease)    takes Dexilant and carafate and gi coctail    Headache    migraines on a daily and monthly regimen    Heart murmur    History of kidney stones    Hodgkin lymphoma (HCC)    STATUS POST MANTLE RADIATION   Hodgkin's lymphoma (HCC)    1987   Hypertension    Hypoxia    Multiple lung nodules    bilateral   Necrotizing fasciitis (HCC) 12/23/2018   Non-ischemic cardiomyopathy (HCC)    Osteoporosis    Ovarian tumor (benign)    bilateral   Palpitations    Pituitary adenoma (HCC) 12/23/2018   Pneumonia    PONV (postoperative nausea and vomiting)    Pre-diabetes    per pt; no meds   Pulmonary hypertension (HCC) 12/23/2018   Raynaud phenomenon    Right heart failure (HCC) 04/17/2017   Seizures (HCC)    last febrile seizure was approx 3 weeks ago per report on 12/01/2020   Supplemental oxygen dependent    3 liters   SVT (supraventricular tachycardia) (HCC)    Tachycardia    Thyroid cancer (HCC)    STATUS POST SURGICAL REMOVAL-CURRENT ON THYROID REPLACEMENT    Past Surgical History:  Procedure Laterality Date   ABDOMINAL HYSTERECTOMY     AMPUTATION Left 01/30/2019   Procedure: Left Index finger amputation with flap reconstruction and repair reconstruction;  Surgeon: Dominica Severin, MD;  Location: [facility] OR;  Service: Orthopedics;  Laterality: Left;   APPENDECTOMY     breast implants and removal      breast implants but leaking      CARDIAC CATHETERIZATION  05/18/2009   NORMAL CATH   CESAREAN SECTION  2007   and 1997   COLONOSCOPY     hx of chemotherapy      hx of radiation therapy      I & D EXTREMITY Left 12/23/2018   Procedure: IRRIGATION AND DEBRIDEMENT HAND / INDEX FINGER;  Surgeon: Dominica Severin, MD;  Location: [facility] OR;  Service: Orthopedics;  Laterality: Left;   IR CV LINE INJECTION  03/22/2022   IR FLUORO GUIDE CV LINE RIGHT  10/09/2022   IR FLUORO GUIDE CV LINE RIGHT  01/18/2023   IR FLUORO GUIDE CV LINE RIGHT  03/26/2023   IR FLUORO GUIDE CV LINE RIGHT  10/10/2023   IR IMAGING GUIDED PORT  INSERTION  05/06/2020   IR IMAGING GUIDED PORT INSERTION  12/04/2021   IR REMOVAL TUN ACCESS W/ PORT W/O FL MOD SED  04/27/2020   IR REMOVAL TUN ACCESS W/ PORT W/O FL MOD SED  12/04/2021   IR REMOVAL TUN ACCESS W/ PORT W/O FL MOD SED  03/30/2022   IR US GUIDE VASC ACCESS RIGHT  10/09/2022   KIDNEY STONE SURGERY     LUMBAR PUNCTURE W/ INTRATHECAL CHEMOTHERAPY     MASTECTOMY     PITUITARY SURGERY     RIGHT/LEFT HEART CATH AND CORONARY ANGIOGRAPHY N/A 04/02/2018   Procedure: RIGHT/LEFT HEART CATH AND CORONARY ANGIOGRAPHY;  Surgeon: Kathleene Hazel, MD;  Location: [facility] INVASIVE CV [facility];  Service: Cardiovascular;  Laterality: N/A;   RIGHT/LEFT HEART CATH AND CORONARY ANGIOGRAPHY N/A 08/31/2022   Procedure: RIGHT/LEFT HEART CATH AND CORONARY ANGIOGRAPHY;  Surgeon: Dolores Patty, MD;  Location: [facility];  Service: Cardiovascular;  Laterality: N/A;   TOOTH EXTRACTION N/A 12/05/2020   Procedure: DENTAL RESTORATION/EXTRACTIONS;  Surgeon: Lovena Neighbours, MD;  Location: [facility] ORS;  Service: Oral Surgery;  Laterality: N/A;   TOTAL THYROIDECTOMY     VIDEO BRONCHOSCOPY Bilateral 11/14/2018   Procedure: VIDEO BRONCHOSCOPY WITHOUT FLUORO;  Surgeon:  Luciano Cutter, MD;  Location: [facility];  Service: Cardiopulmonary;  Laterality: Bilateral;   VIDEO BRONCHOSCOPY WITH ENDOBRONCHIAL ULTRASOUND N/A 11/19/2018   Procedure: VIDEO BRONCHOSCOPY WITH ENDOBRONCHIAL ULTRASOUND;  Surgeon: Luciano Cutter, MD;  Location: [facility] OR;  Service: Thoracic;  Laterality: N/A;    Current Outpatient Medications  Medication Sig Dispense Refill   albuterol (PROVENTIL) (2.5 MG/3ML) 0.083% nebulizer solution INHALE 3 ML BY NEBULIZATION EVERY 6 HOURS AS NEEDED FOR WHEEZING OR SHORTNESS OF BREATH 225 mL 1   ALPRAZolam (XANAX) 1 MG tablet 1 tablet daily and 1 tablet PRN no more than 10 days a month. 40 tablet 0   aspirin EC 81 MG tablet Take 1 tablet (81 mg total) by mouth daily. Swallow whole. (Patient taking differently: Take 81 mg by mouth at bedtime. Chewable) 90 tablet 3   Bismuth Tribromoph-Petrolatum (XEROFORM OCCLUSIVE GAUZE STRIP) PADS Apply 1 each topically as directed. 50 each 1   bumetanide (BUMEX) 1 MG tablet TAKE 1 TABLET BY MOUTH TWICE A DAY 30 tablet 0   buPROPion (WELLBUTRIN XL) 150 MG 24 hr tablet Take 1 tablet (150 mg total) by mouth daily. 90 tablet 0   clonazePAM (KLONOPIN) 1 MG tablet Take 1 tablet (1 mg total) by mouth 2 (two) times daily. 60 tablet 2   cyclobenzaprine (FLEXERIL) 5 MG tablet Take 1 tablet (5 mg total) by mouth 3 (three) times daily as needed for muscle spasms. 20 tablet 0   dexlansoprazole (DEXILANT) 60 MG capsule Take 1 capsule (60 mg total) by mouth daily. 90 capsule 3   diclofenac (VOLTAREN) 50 MG EC tablet Take 50 mg by mouth 2 (two) times daily. *as needed for migraine, Can repeat if needed*     diphenhydrAMINE (BENADRYL) 12.5 MG/5ML elixir Take 10 mLs (25 mg total) by mouth every 6 (six) hours as needed (nausea). 120 mL 0   EPINEPHRINE 0.3 mg/0.3 mL IJ SOAJ injection INJECT 0.3 MG INTO THE MUSCLE AS NEEDED FOR ANAPHYLAXIS. 2 each 1   FLORASTOR 250 MG capsule Take 250 mg by mouth 2 (two) times daily. Mid Morning and Mid  Afternoon     FLUoxetine (PROZAC) 20 MG capsule TAKE 1 CAPSULE BY MOUTH EVERY DAY 90 capsule 0   Heparin  Na, Pork, Lock Flsh PF (BD HEPARIN POSIFLUSH) 100 UNIT/ML SOLN USE 5 MLS IN PORT A CATH ONCE DAILY AFTER MEDICATION ADMINISTRATION AS A HEPLOCK AS DIRECTED. 150 mL 3   hydrocortisone (CORTEF) 10 MG tablet Take 1-2 tablets (10-20 mg total) by mouth See admin instructions. Take 20 mg in the am and 10mg  in the evening. Take after completing prednisone (Patient taking differently: Take 10-20 mg by mouth See admin instructions. Take 20 mg in the am at 10 am and 10 mg in the afternoon. Take after completing prednisone)     lamoTRIgine (LAMICTAL) 200 MG tablet TAKE 1 TABLET BY MOUTH EVERYDAY AT BEDTIME 90 tablet 0   lamoTRIgine (LAMICTAL) 25 MG tablet Take two tablets every morning. 180 tablet 0   Lancets (ONETOUCH DELICA PLUS LANCET33G) MISC 3 (three) times daily. for testing     lansoprazole (PREVACID) 15 MG capsule Take 30 mg by mouth in the morning and at bedtime.     LORazepam (ATIVAN) 0.5 MG tablet Take 1 tablet (0.5 mg total) by mouth 3 (three) times daily as needed for anxiety. (Patient taking differently: Place 0.5 mg into feeding tube 3 (three) times daily as needed for anxiety. Access Port For Nausea) 12 tablet 0   nitroGLYCERIN (NITROSTAT) 0.4 MG SL tablet DISSOLVE 1 TAB UNDER THE TONGUE EVERY 5 MINUTES AS NEEDED FOR CHEST PAIN. MAX OF 3 DOSES, THEN 911. 75 tablet 0   NURTEC 75 MG TBDP Take 1 tablet by mouth daily as needed.     OLANZapine (ZYPREXA) 5 MG tablet Take 1 tablet (5 mg total) by mouth at bedtime. 30 tablet 2   ONETOUCH VERIO test strip 3 (three) times daily. for testing     OXYGEN Inhale 4-5 L/min into the lungs continuous.     pantoprazole (PROTONIX) 40 MG tablet Take 1 tablet (40 mg total) by mouth daily. 30 tablet 3   PROAIR HFA 108 (90 Base) MCG/ACT inhaler Inhale 2 puffs into the lungs every 4 (four) hours as needed for wheezing or shortness of breath.     QVAR REDIHALER 80  MCG/ACT inhaler Inhale 1 puff into the lungs 2 (two) times daily.     RABEprazole (ACIPHEX) 20 MG tablet Take 1 tablet (20 mg total) by mouth daily. 30 tablet 0   rosuvastatin (CRESTOR) 10 MG tablet Take 10 mg by mouth in the morning. Mid morning     sacubitril-valsartan (ENTRESTO) 24-26 MG TAKE 1 TABLET BY MOUTH TWICE A DAY (Patient taking differently: Take 1 tablet by mouth daily.) 60 tablet 5   Sodium Chloride Flush (NORMAL SALINE FLUSH) 0.9 % SOLN Use as directed for central venous catheter maintenance 600 mL 3   Sodium Chloride Flush (NORMAL SALINE FLUSH) 0.9 % SOLN Use as directed for catheter maintenance 600 mL 3   sucralfate (CARAFATE) 1 GM/10ML suspension Take 1 g by mouth daily as needed (as directed for ulcers).     SYNTHROID 100 MCG tablet Take 1 tablet (100 mcg total) by mouth every morning. (Patient taking differently: Take 100 mcg by mouth every morning. * Must be name brand*)     Tiotropium Bromide Monohydrate (SPIRIVA RESPIMAT) 1.25 MCG/ACT AERS Inhale 2 puffs into the lungs daily. 4 g 5   topiramate (TOPAMAX) 50 MG tablet Take 150 mg by mouth at bedtime.     zolpidem (AMBIEN CR) 12.5 MG CR tablet TAKE ONE TABLET BY MOUTH AT BEDTIME AS NEEDED FOR SLEEP. 90 tablet 0   bisoprolol (ZEBETA) 5 MG tablet TAKE  1/2 TABLET BY MOUTH DAILY (Patient not taking: Reported on 12/19/2023) 8 tablet 0   doxycycline (VIBRA-TABS) 100 MG tablet Take 1 tablet (100 mg total) by mouth 2 (two) times daily. (Patient not taking: Reported on 12/19/2023) 14 tablet 0   EMGALITY 120 MG/ML SOSY Inject 120 mg into the skin every 28 (twenty-eight) days. (Patient not taking: Reported on 12/19/2023)     famotidine (PEPCID) 40 MG/5ML suspension Take 2.5 mLs (20 mg total) by mouth daily. 75 mL 0   famotidine (PEPCID) 40 MG/5ML suspension TAKE 1.3 MLS (10.4 MG TOTAL) BY MOUTH DAILY - DISCARD BOTTLE AFTER 30 DAYS FROM PICKUP (Patient not taking: Reported on 12/19/2023) 50 mL 0   hydrOXYzine (ATARAX) 25 MG tablet Take 1 tablet  (25 mg total) by mouth every 6 (six) hours as needed for anxiety. (Patient not taking: Reported on 12/19/2023) 30 tablet 0   zonisamide (ZONEGRAN) 100 MG capsule Take 100 mg by mouth daily. (Patient not taking: Reported on 12/19/2023)     No current facility-administered medications for this visit.    Allergies as of 12/19/2023 - Review Complete 12/19/2023  Allergen Reaction Noted   Ferrous bisglycinate chelate [iron] Anaphylaxis and Other (See Comments) 07/15/2020   Mushroom extract complex (obsolete) Anaphylaxis 04/23/2019   Na ferric gluc cplx in sucrose Anaphylaxis 11/16/2014   Cymbalta [duloxetine hcl] Swelling and Anxiety 02/06/2011   Hydromorphone Other (See Comments) 05/08/2016   Ondansetron hcl Other (See Comments) 05/22/2022   Promethazine Other (See Comments) 08/03/2021   Succinylcholine Other (See Comments) 12/23/2018   Buprenorphine hcl Hives 02/03/2015   Compazine Other (See Comments) 02/06/2011   Duloxetine  02/06/2011   Metoclopramide Other (See Comments) 05/08/2016   Morphine and codeine Hives 02/06/2011   Ondansetron Hives and Rash 02/03/2015   Promethazine hcl Hives 02/06/2011   Tegaderm ag mesh [silver] Rash 02/06/2011    Family History  Adopted: Yes  Family history unknown: Yes    Review of Systems:    Constitutional: No weight loss, fever, chills, weakness or fatigue HEENT: Eyes: No change in vision               Ears, Nose, Throat:  No change in hearing or congestion Skin: No rash or itching Cardiovascular: No chest pain, chest pressure or palpitations   Respiratory: No SOB or cough Gastrointestinal: See HPI and otherwise negative Genitourinary: No dysuria or change in urinary frequency Neurological: No headache, dizziness or syncope Musculoskeletal: No new muscle or joint pain Hematologic: No bleeding or bruising Psychiatric: No history of depression or anxiety    Physical Exam:  Vital signs: BP 116/72   Pulse (!) 102   Ht 5\' 5"  (1.651 m)   Wt  167 lb (75.8 kg) Comment: patient supplied  LMP  (LMP Unknown)   SpO2 99%   BMI 27.79 kg/m   Constitutional:   Pleasant  female appears to be in NAD, Well developed, alert and cooperative Throat: Oral cavity and pharynx without inflammation, swelling or lesion.  Respiratory: Respirations even and unlabored. Lungs clear to auscultation bilaterally.   No wheezes, crackles, or rhonchi. On N/C on 5L Cardiovascular: Murmur noted. No cyanosis or pallor.  Gastrointestinal:  Soft, nondistended, nontender. No rebound or guarding. Normal bowel sounds. No appreciable masses or hepatomegaly. Rectal:  Not performed.  Msk:  Symmetrical without gross deformities. Lymphedema noted to upper extremities and lower extremities.  Neurologic:  Alert and  oriented x4;  grossly normal neurologically.  Skin:   Dry and intact without significant lesions  or rashes. Psychiatric: Oriented to person, place and time. Demonstrates good judgement and reason without abnormal affect or behaviors.  RELEVANT LABS AND IMAGING: CBC    Latest Ref Rng & Units 12/07/2023   12:59 PM 09/03/2023    1:15 PM 07/26/2023    2:33 PM  CBC  WBC 4.0 - 10.5 K/uL 9.6  13.6  11.4   Hemoglobin 12.0 - 15.0 g/dL 9.6  9.6  86.5   Hematocrit 36.0 - 46.0 % 31.2  31.1  32.7   Platelets 150 - 400 K/uL 370  332  314      CMP     Latest Ref Rng & Units 12/07/2023   12:59 PM 09/03/2023    1:15 PM 07/26/2023    2:33 PM  CMP  Glucose 70 - 99 mg/dL 79  784  696   BUN 6 - 20 mg/dL 28  28  30    Creatinine 0.44 - 1.00 mg/dL 2.95  2.84  1.32   Sodium 135 - 145 mmol/L 141  141  140   Potassium 3.5 - 5.1 mmol/L 3.6  3.2  3.2   Chloride 98 - 111 mmol/L 104  105  103   CO2 22 - 32 mmol/L 28  26  24    Calcium 8.9 - 10.3 mg/dL 9.1  8.4  9.1   Total Protein 6.5 - 8.1 g/dL  6.8  7.5   Total Bilirubin <1.2 mg/dL  0.3  0.6   Alkaline Phos 38 - 126 U/L  56  56   AST 15 - 41 U/L  18  20   ALT 0 - 44 U/L  25  30      Lab Results  Component Value Date    TSH 0.508 04/23/2023   04/25/23 Echo-Left ventricular ejection fraction, by estimation, is 60 to 65%.  08/01/2020 colonoscopy, recall 10 years Impression Nonthrombosed external hemorrhoids found on perianal exam Internal hemorrhoids The examination was otherwise normal No specimens collected. 08/01/2020 EGD Findings Normal esophagus 3 cm hiatal hernia Multiple fundic gland polyps Normal examined duodenum No source of hematemesis is identified Biopsies of the greater curvature of the gastric body, lesser curvature of the gastric body, at the incisura, on the greater curvature of the gastric atrium and on the lesser curvature of the gastric atrium. Path stomach biopsy Atrial and oxyntic mucosa with no significant pathologic findings.  Negative for H. Pylori. 09/16/23 MBSS-  Clinical Impression:  Patient presents with a normal oropharyngeal swallow however she does present with a suspected primary esophageal dysphagia as per this modified barium swallow study. 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 Thicker barium consistencies (honey thick, pudding thick and mechanical soft solids did transit a little slowly through PES and upper esophagus but no retrograde movement or stasis of barium visualized in upper esophagus or at level of PES. Patient attempted to swallow barium tablet with plain water but she gagged while tablet still in mouth and spit it out. She reports this is something that can occur without warning when she is eating or taking medications. No aspiration or penetration observed and oral and pharyngeal structures all appeared to be [facility]. SLP recommended patient see an outpatient SLP for help with reintroducing solid foods into her diet.  12/07/23 CT Soft tissue Neck WO contrast IMPRESSION: 1. No acute or focal lesion to explain the patient's symptoms. No radiopaque foreign body or soft tissue nodule to account for a medicine tablet or  capsule in  the neck. 2. Ill-defined airspace opacities in the right upper lobe raise concern for pneumonia.   11/15/2010 EGD      Assessment: Encounter Diagnoses  Name Primary?   Gastroesophageal reflux disease with esophagitis, unspecified whether hemorrhage Yes   Esophageal dysphagia    Loss of weight    Chronic respiratory failure with hypoxia (HCC)    Hx of Hodgkins lymphoma    History of thyroid cancer s/p thyroidectomy and post surgical hypothyroidism      53 year old female patient with extensive medical history who presents with complaint of esophageal dysphagia with uncontrolled GERD and weight loss.  Last endoscopy was in 08/2020 for hematemesis and unremarkable.  Swallow study December 2024 with clinical impression that states presence of suspected esophageal web. Recent CT scan neck 3/25 did not show any indication to explain patient's symptoms.  Patient is on multiple PPI therapies without complete relief of her symptoms.  I will try to send generic Dexilant to see if it is covered as it provided her with the most relief.  Patient has trialed and failed 3 or more PPIs and multiple times per day with H2 antagonist.  Will discuss her complex case with Dr. Barron Alvine with plans to schedule upper GI endoscopy at the hospital and if unremarkable will probably proceed with esophageal manometry to rule out motility disorder.  Patient verbalizes understanding and agrees to plan.  Plan: - Send RX for Dexlansoprazole 60mg  po daily, discontinue other PPI's. She can use Pepcid with Dexilant. Ly Wass consider Voquenza if not covered. - Will discuss with Dr. Santa Lighter about scheduling EGD at hospital .The risks and benefits of EGD with possible biopsies and esophageal dilation were discussed with the patient who agrees to proceed. Pt has history of chronic pulmonary respiratory failure (5L O3 at home) -if negative exam Stephanne Greeley consider esophageal manometry  Thank you for the courtesy of this consult.  Please call me with any questions or concerns.   Jamille Yoshino, FNP-C [facility] 12/19/2023, 1:35 PM  Cc: Toppin, Lillia Dallas, MD

## 2023-12-19 ENCOUNTER — Ambulatory Visit: Payer: Medicare Other | Admitting: Psychiatry

## 2023-12-19 ENCOUNTER — Ambulatory Visit (INDEPENDENT_AMBULATORY_CARE_PROVIDER_SITE_OTHER): Admitting: Gastroenterology

## 2023-12-19 ENCOUNTER — Encounter: Payer: Self-pay | Admitting: Gastroenterology

## 2023-12-19 VITALS — BP 116/72 | HR 102 | Ht 65.0 in | Wt 167.0 lb

## 2023-12-19 DIAGNOSIS — K2100 Gastro-esophageal reflux disease with esophagitis, without bleeding: Secondary | ICD-10-CM

## 2023-12-19 DIAGNOSIS — R09A2 Foreign body sensation, throat: Secondary | ICD-10-CM | POA: Diagnosis not present

## 2023-12-19 DIAGNOSIS — Z85850 Personal history of malignant neoplasm of thyroid: Secondary | ICD-10-CM

## 2023-12-19 DIAGNOSIS — R1319 Other dysphagia: Secondary | ICD-10-CM

## 2023-12-19 DIAGNOSIS — R634 Abnormal weight loss: Secondary | ICD-10-CM

## 2023-12-19 DIAGNOSIS — R1310 Dysphagia, unspecified: Secondary | ICD-10-CM

## 2023-12-19 DIAGNOSIS — J9611 Chronic respiratory failure with hypoxia: Secondary | ICD-10-CM

## 2023-12-19 DIAGNOSIS — Z8571 Personal history of Hodgkin lymphoma: Secondary | ICD-10-CM

## 2023-12-19 MED ORDER — DEXLANSOPRAZOLE 60 MG PO CPDR: 60.0000 mg | DELAYED_RELEASE_CAPSULE | Freq: Every day | ORAL | 3 refills | Status: DC

## 2023-12-19 NOTE — Patient Instructions (Addendum)
 _______________________________________________________  If your blood pressure at your visit was 140/90 or greater, please contact your primary care physician to follow up on this.  If you are age 53 or younger, your body mass index should be between 19-25. Your Body mass index is 27.79 kg/m. If this is out of the aformentioned range listed, please consider follow up with your Primary Care Provider.  ________________________________________________________  The [facility] providers would like to encourage you to use [facility] to communicate with providers for non-urgent requests or questions.  Due to long hold times on the telephone, sending your provider a message by [facility] may be a faster and more efficient way to get a response.  Please allow 48 business hours for a response.  Please remember that this is for non-urgent requests.  _______________________________________________________  We have sent the following medications to your pharmacy for you to pick up at your convenience:  START: Dexilant 60mg  one capsule daily  You have been scheduled for an endoscopy. Please follow written instructions given to you at your visit today.  If you use inhalers (even only as needed), please bring them with you on the day of your procedure.  If you take any of the following medications, they will need to be adjusted prior to your procedure:   DO NOT TAKE 7 DAYS PRIOR TO TEST- Trulicity (dulaglutide) Ozempic, Wegovy (semaglutide) Mounjaro (tirzepatide) Bydureon Bcise (exanatide extended release)  DO NOT TAKE 1 DAY PRIOR TO YOUR TEST Rybelsus (semaglutide) Adlyxin (lixisenatide) Victoza (liraglutide) Byetta (exanatide) ___________________________________________________________________________  Due to recent changes in healthcare laws, you may see the results of your imaging and laboratory studies on MyChart before your provider has had a chance to review them.  We understand that in some  cases there may be results that are confusing or concerning to you. Not all laboratory results come back in the same time frame and the provider may be waiting for multiple results in order to interpret others.  Please give [place] 48 hours in order for your provider to thoroughly review all the results before contacting the office for clarification of your results.   Thank you for entrusting me with your care and choosing [facility].  Deanna May, NP

## 2023-12-22 ENCOUNTER — Emergency Department (HOSPITAL_BASED_OUTPATIENT_CLINIC_OR_DEPARTMENT_OTHER)
Admission: EM | Admit: 2023-12-22 | Discharge: 2023-12-22 | Disposition: A | Discharge status: Home / Self Care | Attending: Emergency Medicine | Admitting: Emergency Medicine

## 2023-12-22 ENCOUNTER — Emergency Department (HOSPITAL_BASED_OUTPATIENT_CLINIC_OR_DEPARTMENT_OTHER)

## 2023-12-22 ENCOUNTER — Other Ambulatory Visit (INDEPENDENT_AMBULATORY_CARE_PROVIDER_SITE_OTHER): Payer: Self-pay | Admitting: Otolaryngology

## 2023-12-22 DIAGNOSIS — T503X5A Adverse effect of electrolytic, caloric and water-balance agents, initial encounter: Secondary | ICD-10-CM

## 2023-12-22 DIAGNOSIS — Z452 Encounter for adjustment and management of vascular access device: Secondary | ICD-10-CM | POA: Diagnosis not present

## 2023-12-22 DIAGNOSIS — Z7982 Long term (current) use of aspirin: Secondary | ICD-10-CM | POA: Diagnosis not present

## 2023-12-22 DIAGNOSIS — K208 Other esophagitis without bleeding: Secondary | ICD-10-CM

## 2023-12-22 DIAGNOSIS — K224 Dyskinesia of esophagus: Secondary | ICD-10-CM

## 2023-12-22 DIAGNOSIS — R918 Other nonspecific abnormal finding of lung field: Secondary | ICD-10-CM | POA: Diagnosis not present

## 2023-12-22 DIAGNOSIS — R000 Tachycardia, unspecified: Secondary | ICD-10-CM | POA: Diagnosis not present

## 2023-12-22 DIAGNOSIS — R079 Chest pain, unspecified: Secondary | ICD-10-CM | POA: Diagnosis not present

## 2023-12-22 DIAGNOSIS — R0989 Other specified symptoms and signs involving the circulatory and respiratory systems: Secondary | ICD-10-CM | POA: Diagnosis not present

## 2023-12-22 DIAGNOSIS — K229 Disease of esophagus, unspecified: Secondary | ICD-10-CM | POA: Diagnosis not present

## 2023-12-22 MED ORDER — HEPARIN SOD (PORK) LOCK FLUSH 100 UNIT/ML IV SOLN
500.0000 [IU] | Freq: Once | INTRAVENOUS | Status: AC
  Administered 2023-12-22: 500 [IU]

## 2023-12-22 MED ORDER — DIAZEPAM 5 MG PO TABS
5.0000 mg | ORAL_TABLET | Freq: Once | ORAL | Status: AC
Start: 2023-12-22 — End: 2023-12-22
  Administered 2023-12-22: 5 mg via ORAL
  Filled 2023-12-22: qty 1

## 2023-12-22 MED ORDER — LIDOCAINE VISCOUS HCL 2 % MT SOLN: 10.0000 mL | OROMUCOSAL | 1 refills | Status: DC | PRN

## 2023-12-22 MED ORDER — DIAZEPAM 5 MG/ML IJ SOLN: 5.0000 mg | Freq: Once | INTRAMUSCULAR | Status: DC

## 2023-12-22 MED ORDER — DIAZEPAM 5 MG/ML PO CONC: 5.0000 mg | Freq: Once | ORAL | Status: DC

## 2023-12-22 MED ORDER — HYOSCYAMINE SULFATE 0.125 MG SL SUBL: 0.1250 mg | SUBLINGUAL_TABLET | Freq: Four times a day (QID) | SUBLINGUAL | 1 refills | Status: DC | PRN

## 2023-12-22 MED ORDER — HYOSCYAMINE SULFATE 0.125 MG SL SUBL
0.1250 mg | SUBLINGUAL_TABLET | Freq: Once | SUBLINGUAL | Status: AC
  Administered 2023-12-22: 0.125 mg via SUBLINGUAL
  Filled 2023-12-22: qty 1

## 2023-12-22 MED ORDER — LIDOCAINE VISCOUS HCL 2 % MT SOLN
15.0000 mL | Freq: Once | OROMUCOSAL | Status: AC
  Administered 2023-12-22: 15 mL via OROMUCOSAL
  Filled 2023-12-22: qty 15

## 2023-12-22 NOTE — ED Triage Notes (Signed)
 5LPM via [place] at baseline. See for globus recently and given glucagon- pill suspected. Followed up with GI for ongoing discomfort and endo scheduled. Comes in today for continuing pain and coughing up occasional blood-bright red-no clots.   Extensive resp/card history.

## 2023-12-22 NOTE — ED Provider Notes (Signed)
 [place] EMERGENCY DEPARTMENT AT [facility] Provider Note   CSN: [id number] Arrival date & time: 12/22/23  1601     History {Add pertinent medical, surgical, social history, OB history to HPI:1} No chief complaint on file.   [name] is a 53 y.o. female who presents to the emergency department with chief complaint of esophageal pain.  She has a history of longstanding dysphagia a distant history of radiation to the chest and constant reflux.  She was seen about 2 weeks ago after swallowing her regular pills including potassium Lamictal which are larger.  They felt like they were hung up in her throat for period of time.  She had an extensive workup including a CT soft tissue of the neck which was negative.  Patient was able to swallow liquids.  Since that time she has had severe pain in her neck and chest.  She is having constant pain especially when she tries to swallow anything including liquids and at times she has a gripping pain points to her retrosternal region that feels like severe squeezing pain which is unrelieved when she tries to move or stand" brings me to tears."  She has not had specific vomiting but does that she "spit up" a little bit of streaky blood.  Patient is feeling very frustrated with her life right now I had been more than a year since she has been able to have any solid foods.  HPI     Home Medications Prior to Admission medications   Medication Sig Start Date End Date Taking? Authorizing Provider  albuterol (PROVENTIL) (2.5 MG/3ML) 0.083% nebulizer solution INHALE 3 ML BY NEBULIZATION EVERY 6 HOURS AS NEEDED FOR WHEEZING OR SHORTNESS OF BREATH 12/13/23   Luciano Cutter, MD  ALPRAZolam Prudy Feeler) 1 MG tablet 1 tablet daily and 1 tablet PRN no more than 10 days a month. 11/07/23   Mozingo, Thereasa Solo, NP  aspirin EC 81 MG tablet Take 1 tablet (81 mg total) by mouth daily. Swallow whole. Patient taking differently: Take 81 mg by mouth at bedtime.  Chewable 07/16/22   Nahser, Deloris Ping, MD  Bismuth Tribromoph-Petrolatum (XEROFORM OCCLUSIVE GAUZE STRIP) PADS Apply 1 each topically as directed. 09/17/22   Rolly Salter, MD  bisoprolol (ZEBETA) 5 MG tablet TAKE 1/2 TABLET BY MOUTH DAILY Patient not taking: Reported on 12/19/2023 10/16/23   Nahser, Deloris Ping, MD  bumetanide (BUMEX) 1 MG tablet TAKE 1 TABLET BY MOUTH TWICE A DAY 11/04/23   Nahser, Deloris Ping, MD  buPROPion (WELLBUTRIN XL) 150 MG 24 hr tablet Take 1 tablet (150 mg total) by mouth daily. 09/19/23   Mozingo, Thereasa Solo, NP  clonazePAM (KLONOPIN) 1 MG tablet Take 1 tablet (1 mg total) by mouth 2 (two) times daily. 11/04/23   Mozingo, Thereasa Solo, NP  cyclobenzaprine (FLEXERIL) 5 MG tablet Take 1 tablet (5 mg total) by mouth 3 (three) times daily as needed for muscle spasms. 06/20/23   Leath-Warren, Sadie Haber, NP  dexlansoprazole (DEXILANT) 60 MG capsule Take 1 capsule (60 mg total) by mouth daily. 12/19/23   May, Deanna J, NP  diclofenac (VOLTAREN) 50 MG EC tablet Take 50 mg by mouth 2 (two) times daily. *as needed for migraine, Can repeat if needed* 07/23/23   [provider]  diphenhydrAMINE (BENADRYL) 12.5 MG/5ML elixir Take 10 mLs (25 mg total) by mouth every 6 (six) hours as needed (nausea). 09/17/22   Rolly Salter, MD  doxycycline (VIBRA-TABS) 100 MG tablet Take 1  tablet (100 mg total) by mouth 2 (two) times daily. Patient not taking: Reported on 12/19/2023 07/26/23   Linwood Dibbles, MD  [facility] 120 MG/ML SOSY Inject 120 mg into the skin every 28 (twenty-eight) days. Patient not taking: Reported on 12/19/2023    [provider]  EPINEPHRINE 0.3 mg/0.3 mL IJ SOAJ injection INJECT 0.3 MG INTO THE MUSCLE AS NEEDED FOR ANAPHYLAXIS. 04/25/23   Luciano Cutter, MD  famotidine (PEPCID) 40 MG/5ML suspension Take 2.5 mLs (20 mg total) by mouth daily. 03/30/23 05/16/23  Curatolo, Adam, DO  famotidine (PEPCID) 40 MG/5ML suspension TAKE 1.3 MLS (10.4 MG TOTAL) BY MOUTH DAILY -  DISCARD BOTTLE AFTER 30 DAYS FROM PICKUP Patient not taking: Reported on 12/19/2023 11/25/23   Read Drivers, MD  FLORASTOR 250 MG capsule Take 250 mg by mouth 2 (two) times daily. Mid Morning and Mid Afternoon    [provider]  FLUoxetine (PROZAC) 20 MG capsule TAKE 1 CAPSULE BY MOUTH EVERY DAY 11/29/23   Mozingo, Thereasa Solo, NP  Heparin Na, Pork, Lock Flsh PF (BD HEPARIN POSIFLUSH) 100 UNIT/ML SOLN USE 5 MLS IN PORT A CATH ONCE DAILY AFTER MEDICATION ADMINISTRATION AS A HEPLOCK AS DIRECTED. 09/30/23     hydrocortisone (CORTEF) 10 MG tablet Take 1-2 tablets (10-20 mg total) by mouth See admin instructions. Take 20 mg in the am and 10mg  in the evening. Take after completing prednisone Patient taking differently: Take 10-20 mg by mouth See admin instructions. Take 20 mg in the am at 10 am and 10 mg in the afternoon. Take after completing prednisone 09/29/22   Rolly Salter, MD  hydrOXYzine (ATARAX) 25 MG tablet Take 1 tablet (25 mg total) by mouth every 6 (six) hours as needed for anxiety. Patient not taking: Reported on 12/19/2023 01/24/23   Azucena Fallen, MD  lamoTRIgine (LAMICTAL) 200 MG tablet TAKE 1 TABLET BY MOUTH EVERYDAY AT BEDTIME 09/19/23   Mozingo, Thereasa Solo, NP  lamoTRIgine (LAMICTAL) 25 MG tablet Take two tablets every morning. 09/19/23   Mozingo, Thereasa Solo, NP  Lancets (ONETOUCH DELICA PLUS [place]) MISC 3 (three) times daily. for testing 06/12/21   [provider]  lansoprazole (PREVACID) 15 MG capsule Take 30 mg by mouth in the morning and at bedtime.    [provider]  LORazepam (ATIVAN) 0.5 MG tablet Take 1 tablet (0.5 mg total) by mouth 3 (three) times daily as needed for anxiety. Patient taking differently: Place 0.5 mg into feeding tube 3 (three) times daily as needed for anxiety. [employer] For Nausea 02/11/23   Mozingo, Thereasa Solo, NP  nitroGLYCERIN (NITROSTAT) 0.4 MG SL tablet DISSOLVE 1 TAB UNDER THE TONGUE EVERY 5  MINUTES AS NEEDED FOR CHEST PAIN. MAX OF 3 DOSES, THEN 911. 07/03/23   Nahser, Deloris Ping, MD  NURTEC 75 MG TBDP Take 1 tablet by mouth daily as needed. 09/03/23   [provider]  OLANZapine (ZYPREXA) 5 MG tablet Take 1 tablet (5 mg total) by mouth at bedtime. 09/19/23   Mozingo, Thereasa Solo, NP  [facility] VERIO test strip 3 (three) times daily. for testing 06/12/21   [provider]  OXYGEN Inhale 4-5 L/min into the lungs continuous.    [provider]  pantoprazole (PROTONIX) 40 MG tablet Take 1 tablet (40 mg total) by mouth daily. 10/28/23   Read Drivers, MD  PROAIR HFA [phone] Base) MCG/ACT inhaler Inhale 2 puffs into the lungs every 4 (four) hours as needed for wheezing or  shortness of breath.    [provider]  QVAR REDIHALER 80 MCG/ACT inhaler Inhale 1 puff into the lungs 2 (two) times daily. 05/10/23   [provider]  RABEprazole (ACIPHEX) 20 MG tablet Take 1 tablet (20 mg total) by mouth daily. 09/02/23   Read Drivers, MD  rosuvastatin (CRESTOR) 10 MG tablet Take 10 mg by mouth in the morning. Mid morning 02/28/18   [provider]  sacubitril-valsartan (ENTRESTO) 24-26 MG TAKE 1 TABLET BY MOUTH TWICE A DAY Patient taking differently: Take 1 tablet by mouth daily. 07/03/23   Nahser, Deloris Ping, MD  Sodium Chloride Flush (NORMAL SALINE FLUSH) 0.9 % SOLN Use as directed for central venous catheter maintenance 01/31/23     Sodium Chloride Flush (NORMAL SALINE FLUSH) 0.9 % SOLN Use as directed for catheter maintenance 11/07/23     sucralfate (CARAFATE) 1 GM/10ML suspension Take 1 g by mouth daily as needed (as directed for ulcers).    [provider]  SYNTHROID 100 MCG tablet Take 1 tablet (100 mcg total) by mouth every morning. Patient taking differently: Take 100 mcg by mouth every morning. * Must be name brand* 10/19/21   Serena Croissant, MD  Tiotropium Bromide Monohydrate (SPIRIVA RESPIMAT) 1.25 MCG/ACT AERS Inhale 2 puffs into the  lungs daily. 05/16/23   Luciano Cutter, MD  topiramate (TOPAMAX) 50 MG tablet Take 150 mg by mouth at bedtime. 12/25/19   [provider]  zolpidem (AMBIEN CR) 12.5 MG CR tablet TAKE ONE TABLET BY MOUTH AT BEDTIME AS NEEDED FOR SLEEP. 05/14/23     zonisamide (ZONEGRAN) 100 MG capsule Take 100 mg by mouth daily. Patient not taking: Reported on 12/19/2023 10/09/23 10/08/24  [provider]      Allergies    Ferrous bisglycinate chelate [iron], Mushroom extract complex (obsolete), Na ferric gluc cplx in sucrose, Cymbalta [duloxetine hcl], Hydromorphone, Ondansetron hcl, Promethazine, Succinylcholine, Buprenorphine hcl, Compazine, Duloxetine, Metoclopramide, Morphine and codeine, Ondansetron, Promethazine hcl, and Tegaderm ag mesh [silver]    Review of Systems   Review of Systems  Physical Exam Updated Vital Signs BP 138/85 (BP Location: Left Arm)   Pulse (!) 108   Resp 20   LMP  (LMP Unknown)   SpO2 100%  Physical Exam Vitals and nursing note reviewed.  Constitutional:      General: She is not in acute distress.    Appearance: She is well-developed. She is not diaphoretic.     Comments: Patient sitting up right in bed.  She is tearful.  She is swallowing her own saliva  HENT:     Head: Normocephalic and atraumatic.     Right Ear: External ear normal.     Left Ear: External ear normal.     Nose: Nose normal.     Mouth/Throat:     Mouth: Mucous membranes are moist.  Eyes:     General: No scleral icterus.    Conjunctiva/sclera: Conjunctivae normal.  Cardiovascular:     Rate and Rhythm: Normal rate and regular rhythm.     Heart sounds: Normal heart sounds. No murmur heard.    No friction rub. No gallop.  Pulmonary:     Effort: Pulmonary effort is normal. No respiratory distress.     Breath sounds: Normal breath sounds.  Abdominal:     General: Bowel sounds are normal. There is no distension.     Palpations: Abdomen is soft. There is no mass.     Tenderness: There  is no abdominal tenderness.  There is no guarding.  Musculoskeletal:     Cervical back: Normal range of motion.  Skin:    General: Skin is warm and dry.  Neurological:     Mental Status: She is alert and oriented to person, place, and time.  Psychiatric:        Mood and Affect: Mood is depressed. Affect is tearful.        Behavior: Behavior normal.     ED Results / Procedures / Treatments   Labs (all labs ordered are listed, but only abnormal results are displayed) Labs Reviewed  BASIC METABOLIC PANEL  CBC  TROPONIN I (HIGH SENSITIVITY)    EKG None  Radiology DG Chest Portable 1 View Result Date: 12/22/2023 CLINICAL DATA:  Chest pain EXAM: PORTABLE CHEST 1 VIEW COMPARISON:  Chest radiograph dated 09/03/2023 FINDINGS: Lines/tubes: Central venous catheter tip projects over the superior cavoatrial junction. Lungs: Low lung volumes with bronchovascular crowding. Bibasilar linear opacities. Pleura: No pneumothorax or pleural effusion. Heart/mediastinum: The heart size and mediastinal contours are within normal limits. Bones: No acute osseous abnormality. Surgical clips project over the bilateral axilla. IMPRESSION: Low lung volumes with bronchovascular crowding and bibasilar linear opacities, likely atelectasis. Electronically Signed   By: Agustin Cree M.D.   On: 12/22/2023 16:52    Procedures Procedures  {Document cardiac monitor, telemetry assessment procedure when appropriate:1}  Medications Ordered in ED Medications  lidocaine (XYLOCAINE) 2 % viscous mouth solution 15 mL (has no administration in time range)    ED Course/ Medical Decision Making/ A&P   {   Click here for ABCD2, HEART and other calculatorsREFRESH Note before signing :1}                              Medical Decision Making Amount and/or Complexity of Data Reviewed Labs: ordered. Radiology: ordered.  Risk Prescription drug management.   ***  {Document critical care time when appropriate:1} {Document  review of labs and clinical decision tools ie heart score, Chads2Vasc2 etc:1}  {Document your independent review of radiology images, and any outside records:1} {Document your discussion with family members, caretakers, and with consultants:1} {Document social determinants of health affecting pt's care:1} {Document your decision making why or why not admission, treatments were needed:1} Final Clinical Impression(s) / ED Diagnoses Final diagnoses:  None    Rx / DC Orders ED Discharge Orders     None

## 2023-12-22 NOTE — Discharge Instructions (Signed)
 Get help right away if: You have very bad pain in your arms, neck, jaw, teeth, or back. You feel sweaty, dizzy, or light-headed all of a sudden. You have chest pain or feel short of breath. You throw up and: It's green, yellow, or black. It looks like blood or coffee grounds. Your poop is red, bloody, or black. You can't swallow, drink, or eat. These symptoms may be an emergency. Call 911 right away. Do not wait to see if the symptoms will go away. Do not drive yourself to the hospital.

## 2023-12-23 ENCOUNTER — Other Ambulatory Visit (HOSPITAL_COMMUNITY): Payer: Self-pay

## 2023-12-23 ENCOUNTER — Other Ambulatory Visit: Payer: Self-pay

## 2023-12-23 NOTE — Progress Notes (Signed)
 Agree with the assessment and plan as outlined by [facility], FNP-C.  Agree with plan for EGD at the hospital endoscopy unit due to elevated periprocedural risk from underlying comorbidities.'s can plan for evaluation for stricture as suggested by previous MBS and if unremarkable, esophageal manometry.  May consider pH/impedance testing as well to evaluate for breakthrough reflux versus hypersensitivity or other pathology  Doristine Locks, DO, [facility]

## 2023-12-24 ENCOUNTER — Other Ambulatory Visit (HOSPITAL_COMMUNITY): Payer: Self-pay

## 2023-12-24 ENCOUNTER — Telehealth: Payer: Self-pay | Admitting: Gastroenterology

## 2023-12-24 ENCOUNTER — Telehealth: Payer: Self-pay

## 2023-12-24 NOTE — Telephone Encounter (Signed)
 Inbound call from patient stating that she is currently scheduled for a EGD on 5/27 with Dr. Barron Alvine and she is not sure if she will be able to wait that long for her appointment. Patient states the medication that Deanna advised her to take she is unable to take because she is in so much pain and it burns her throat. Patient is not able to eat solids.  Patient is requesting a call to discuss if there is anyway possible she can have procedure done before 5/27. Please advise.

## 2023-12-24 NOTE — Telephone Encounter (Signed)
 Pharmacy Patient Advocate Encounter   Received notification from CoverMyMeds that prior authorization for Dexlansoprazole 60MG  dr capsules is required/requested.   Insurance verification completed.   The patient is insured through [employer] .   Per test claim: PA required; PA submitted to above mentioned insurance via CoverMyMeds Key/confirmation #/EOC [id number] Status is pending

## 2023-12-24 NOTE — Telephone Encounter (Signed)
 Patient's EGD has been moved up to Thursday, 12/26/23 at 7:30 am with Dr. Adela Lank at [facility]. Patient is aware that the arrival time is 6 am with a care partner. Updated instructions sent to patient via MyChart. Pre-authorization team notified that procedure is this week instead.

## 2023-12-25 ENCOUNTER — Encounter: Payer: Self-pay | Admitting: Hematology and Oncology

## 2023-12-25 ENCOUNTER — Encounter (HOSPITAL_COMMUNITY): Payer: Self-pay | Admitting: Gastroenterology

## 2023-12-25 ENCOUNTER — Other Ambulatory Visit (HOSPITAL_COMMUNITY): Payer: Self-pay

## 2023-12-25 MED ORDER — NORMAL SALINE FLUSH 0.9 % IV SOLN
INTRAVENOUS | 3 refills | Status: DC
  Filled 2023-12-25: qty 600, 30d supply, fill #0
  Filled 2024-01-03: qty 600, 30d supply, fill #1
  Filled 2024-01-18: qty 600, 30d supply, fill #2
  Filled 2024-01-30: qty 600, 30d supply, fill #3

## 2023-12-25 NOTE — Anesthesia Preprocedure Evaluation (Addendum)
 Anesthesia Evaluation  Patient identified by MRN, date of birth, ID band Patient awake    Reviewed: Allergy & Precautions, H&P , NPO status , Patient's Chart, lab work & pertinent test results  History of Anesthesia Complications (+) PONV and history of anesthetic complications  Airway Mallampati: II   Neck ROM: full    Dental   Pulmonary asthma    breath sounds clear to auscultation       Cardiovascular hypertension, +CHF  + dysrhythmias Supra Ventricular Tachycardia + Valvular Problems/Murmurs  Rhythm:regular Rate:Normal     Neuro/Psych  Headaches, Seizures -,  PSYCHIATRIC DISORDERS Anxiety Depression       GI/Hepatic ,GERD  ,,  Endo/Other  Hypothyroidism    Renal/GU Renal InsufficiencyRenal diseasestones     Musculoskeletal   Abdominal   Peds  Hematology H/o lymphoma   Anesthesia Other Findings   Reproductive/Obstetrics                             Anesthesia Physical Anesthesia Plan  ASA: 3  Anesthesia Plan: MAC   Post-op Pain Management:    Induction: Intravenous  PONV Risk Score and Plan: 3 and Propofol infusion and Treatment may vary due to age or medical condition  Airway Management Planned: Nasal Cannula  Additional Equipment:   Intra-op Plan:   Post-operative Plan:   Informed Consent: I have reviewed the patients History and Physical, chart, labs and discussed the procedure including the risks, benefits and alternatives for the proposed anesthesia with the patient or authorized representative who has indicated his/her understanding and acceptance.     Dental advisory given  Plan Discussed with: CRNA, Anesthesiologist and Surgeon  Anesthesia Plan Comments: (Reviewed medically complex pt. 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   Regarding her use of oxygen: per pulmonary note 2017: She established with [facility] in 09/2015 for chronic cough, dyspnea and hypoxia of unclear etiology. Currently being managed as some combination of severe GERD 2/2 her XRT for HL, cough-variant asthma, diastolic HF. No response to max dose gabapentin for suspected neurogenic cough. 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 The etiology of her chronic hypoxemic respiratory failure remains unclear with no parenchymal disease on CT scans, near-normal PFTs, and normal oxygenation on CPET in 2017. Could be vasculitis-related.  Patient with severe dysphagia. Only swallowing liquids. Advised ok to proceed and eval DOS as long as breathing is stable in pre op call)        Anesthesia Quick Evaluation

## 2023-12-26 ENCOUNTER — Other Ambulatory Visit: Payer: Self-pay

## 2023-12-26 ENCOUNTER — Ambulatory Visit (HOSPITAL_COMMUNITY): Payer: Self-pay | Admitting: Anesthesiology

## 2023-12-26 ENCOUNTER — Encounter (HOSPITAL_COMMUNITY): Payer: Self-pay | Admitting: Gastroenterology

## 2023-12-26 ENCOUNTER — Encounter (HOSPITAL_COMMUNITY)
Admission: RE | Disposition: A | Discharge status: Home / Self Care | Payer: Self-pay | Source: Home / Self Care | Attending: Gastroenterology

## 2023-12-26 ENCOUNTER — Ambulatory Visit (HOSPITAL_COMMUNITY)
Admission: RE | Admit: 2023-12-26 | Discharge: 2023-12-26 | Disposition: A | Discharge status: Home / Self Care | Attending: Gastroenterology | Admitting: Gastroenterology

## 2023-12-26 DIAGNOSIS — K449 Diaphragmatic hernia without obstruction or gangrene: Secondary | ICD-10-CM | POA: Diagnosis not present

## 2023-12-26 DIAGNOSIS — K317 Polyp of stomach and duodenum: Secondary | ICD-10-CM | POA: Insufficient documentation

## 2023-12-26 DIAGNOSIS — I890 Lymphedema, not elsewhere classified: Secondary | ICD-10-CM | POA: Diagnosis not present

## 2023-12-26 DIAGNOSIS — Z9013 Acquired absence of bilateral breasts and nipples: Secondary | ICD-10-CM | POA: Diagnosis not present

## 2023-12-26 DIAGNOSIS — F32A Depression, unspecified: Secondary | ICD-10-CM | POA: Diagnosis not present

## 2023-12-26 DIAGNOSIS — R1310 Dysphagia, unspecified: Secondary | ICD-10-CM

## 2023-12-26 DIAGNOSIS — Z9221 Personal history of antineoplastic chemotherapy: Secondary | ICD-10-CM | POA: Insufficient documentation

## 2023-12-26 DIAGNOSIS — I50812 Chronic right heart failure: Secondary | ICD-10-CM | POA: Insufficient documentation

## 2023-12-26 DIAGNOSIS — Z79899 Other long term (current) drug therapy: Secondary | ICD-10-CM | POA: Insufficient documentation

## 2023-12-26 DIAGNOSIS — J9611 Chronic respiratory failure with hypoxia: Secondary | ICD-10-CM | POA: Diagnosis not present

## 2023-12-26 DIAGNOSIS — B3781 Candidal esophagitis: Secondary | ICD-10-CM | POA: Diagnosis not present

## 2023-12-26 DIAGNOSIS — K2950 Unspecified chronic gastritis without bleeding: Secondary | ICD-10-CM | POA: Insufficient documentation

## 2023-12-26 DIAGNOSIS — R1319 Other dysphagia: Secondary | ICD-10-CM

## 2023-12-26 DIAGNOSIS — Z8572 Personal history of non-Hodgkin lymphomas: Secondary | ICD-10-CM | POA: Insufficient documentation

## 2023-12-26 DIAGNOSIS — Z86718 Personal history of other venous thrombosis and embolism: Secondary | ICD-10-CM | POA: Insufficient documentation

## 2023-12-26 DIAGNOSIS — Z8639 Personal history of other endocrine, nutritional and metabolic disease: Secondary | ICD-10-CM | POA: Diagnosis not present

## 2023-12-26 DIAGNOSIS — K2970 Gastritis, unspecified, without bleeding: Secondary | ICD-10-CM | POA: Diagnosis not present

## 2023-12-26 DIAGNOSIS — I130 Hypertensive heart and chronic kidney disease with heart failure and stage 1 through stage 4 chronic kidney disease, or unspecified chronic kidney disease: Secondary | ICD-10-CM | POA: Insufficient documentation

## 2023-12-26 DIAGNOSIS — Z923 Personal history of irradiation: Secondary | ICD-10-CM | POA: Diagnosis not present

## 2023-12-26 DIAGNOSIS — K2100 Gastro-esophageal reflux disease with esophagitis, without bleeding: Secondary | ICD-10-CM

## 2023-12-26 DIAGNOSIS — K222 Esophageal obstruction: Secondary | ICD-10-CM | POA: Insufficient documentation

## 2023-12-26 DIAGNOSIS — D509 Iron deficiency anemia, unspecified: Secondary | ICD-10-CM | POA: Diagnosis not present

## 2023-12-26 DIAGNOSIS — I509 Heart failure, unspecified: Secondary | ICD-10-CM | POA: Diagnosis not present

## 2023-12-26 DIAGNOSIS — I471 Supraventricular tachycardia, unspecified: Secondary | ICD-10-CM | POA: Insufficient documentation

## 2023-12-26 DIAGNOSIS — Z7982 Long term (current) use of aspirin: Secondary | ICD-10-CM | POA: Insufficient documentation

## 2023-12-26 DIAGNOSIS — K31819 Angiodysplasia of stomach and duodenum without bleeding: Secondary | ICD-10-CM

## 2023-12-26 DIAGNOSIS — Z853 Personal history of malignant neoplasm of breast: Secondary | ICD-10-CM | POA: Diagnosis not present

## 2023-12-26 DIAGNOSIS — R1314 Dysphagia, pharyngoesophageal phase: Secondary | ICD-10-CM | POA: Diagnosis not present

## 2023-12-26 DIAGNOSIS — K219 Gastro-esophageal reflux disease without esophagitis: Secondary | ICD-10-CM

## 2023-12-26 DIAGNOSIS — N183 Chronic kidney disease, stage 3 unspecified: Secondary | ICD-10-CM | POA: Insufficient documentation

## 2023-12-26 DIAGNOSIS — K31811 Angiodysplasia of stomach and duodenum with bleeding: Secondary | ICD-10-CM | POA: Diagnosis not present

## 2023-12-26 DIAGNOSIS — F419 Anxiety disorder, unspecified: Secondary | ICD-10-CM | POA: Diagnosis not present

## 2023-12-26 DIAGNOSIS — Z90721 Acquired absence of ovaries, unilateral: Secondary | ICD-10-CM | POA: Diagnosis not present

## 2023-12-26 DIAGNOSIS — Z9981 Dependence on supplemental oxygen: Secondary | ICD-10-CM | POA: Insufficient documentation

## 2023-12-26 HISTORY — PX: ESOPHAGOGASTRODUODENOSCOPY: SHX5428

## 2023-12-26 HISTORY — PX: BIOPSY OF SKIN SUBCUTANEOUS TISSUE AND/OR MUCOUS MEMBRANE: SHX6741

## 2023-12-26 HISTORY — PX: HOT HEMOSTASIS: SHX5433

## 2023-12-26 SURGERY — EGD (ESOPHAGOGASTRODUODENOSCOPY)
Anesthesia: Monitor Anesthesia Care

## 2023-12-26 MED ORDER — PHENYLEPHRINE HCL (PRESSORS) 10 MG/ML IV SOLN
INTRAVENOUS | Status: DC | PRN
  Administered 2023-12-26: 120 ug via INTRAVENOUS

## 2023-12-26 MED ORDER — PROPOFOL 500 MG/50ML IV EMUL
INTRAVENOUS | Status: DC | PRN
  Administered 2023-12-26: 200 ug/kg/min via INTRAVENOUS
  Administered 2023-12-26: 40 mg via INTRAVENOUS
  Administered 2023-12-26 (×2): 50 mg via INTRAVENOUS

## 2023-12-26 MED ORDER — METHYLPREDNISOLONE SODIUM SUCC 125 MG IJ SOLR
INTRAMUSCULAR | Status: DC | PRN
Start: 2023-12-26 — End: 2023-12-26
  Administered 2023-12-26: 125 mg via INTRAVENOUS

## 2023-12-26 MED ORDER — FLUCONAZOLE 200 MG PO TABS: 200.0000 mg | ORAL_TABLET | Freq: Every day | ORAL | 0 refills | Status: DC

## 2023-12-26 MED ORDER — METHYLPREDNISOLONE SODIUM SUCC 125 MG IJ SOLR
INTRAMUSCULAR | Status: AC
  Filled 2023-12-26: qty 2

## 2023-12-26 MED ORDER — SODIUM CHLORIDE 0.9 % IV SOLN
INTRAVENOUS | Status: DC
Start: 2023-12-26 — End: 2023-12-26

## 2023-12-26 NOTE — Discharge Instructions (Signed)

## 2023-12-26 NOTE — Op Note (Signed)
 [facility] Patient Name: [name] Procedure Date: 12/26/2023 MRN: [id number] Attending MD: Willaim Rayas. Adela Lank , MD, [id number] Date of Birth: [birth year] CSN: [id number] Age: 53 Admit Type: Outpatient Procedure:                Upper GI endoscopy Indications:              Dysphagia, Odynophagia, Suspected gastro-esophageal                            reflux disease - on high dose PPI, continued                            symptoms. MBS suggests proximal esophageal web.                            Also, history of iron deficiency, chronic                            microcytic anemia. Providers:                Willaim Rayas. Adela Lank, MD, Fransisca Connors,                            Sunday Corn Mbumina, Technician Referring MD:              Medicines:                Monitored Anesthesia Care Complications:            No immediate complications. Estimated blood loss:                            Minimal. Estimated Blood Loss:     Estimated blood loss was minimal. Procedure:                Pre-Anesthesia Assessment:                           - Prior to the procedure, a History and Physical                            was performed, and patient medications and                            allergies were reviewed. The patient's tolerance of                            previous anesthesia was also reviewed. The risks                            and benefits of the procedure and the sedation                            options and risks were discussed with the patient.                            All questions were answered, and informed  consent                            was obtained. Prior Anticoagulants: The patient has                            taken no anticoagulant or antiplatelet agents. ASA                            Grade Assessment: III - A patient with severe                            systemic disease. After reviewing the risks and                            benefits, the  patient was deemed in satisfactory                            condition to undergo the procedure.                           After obtaining informed consent, the endoscope was                            passed under direct vision. Throughout the                            procedure, the patient's blood pressure, pulse, and                            oxygen saturations were monitored continuously. The                            GIF-H190 ([id number]) Olympus endoscope was introduced                            through the mouth, and advanced to the second part                            of duodenum. The upper GI endoscopy was                            accomplished without difficulty. The patient                            tolerated the procedure well. Scope In: Scope Out: Findings:      Esophagogastric landmarks were identified: the gastroesophageal junction       was found at 35 cm and the upper extent of the gastric folds was found       at 37 cm from the incisors.      A 2 cm hiatal hernia was present.      Diffuse, white plaques were found in the entire esophagus. 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 Biopsies were taken with a cold forceps for       histology.      One benign-appearing, subtle suspected intrinsic stenosis was found in       the proximal esophagus. Given severity of esophageal candidiasis I       elected not to dilate this area today (symptoms are chronic ongoing over       1 year - would be safest to treat severe esophageal candidiasis first       and then repeat EGD with dilation of stricture).      The exam of the esophagus was otherwise normal.      Multiple sessile polyps were found in the gastric fundus and in the       gastric body - small to medium in size. Suspect benign fundic gland       polyps. Biopsies were taken with a cold forceps for histology.      A single angiodysplastic lesion  with stigmata of recent bleeding and old       blood in the area, was found at the pyloric channel. On review of labs       the patient has a chronic microcytic anemia with history of iron       deficiency.. Fulguration to ablate the lesion by argon plasma was       successful.      The exam of the stomach was otherwise normal.      The examined duodenum was normal. Impression:               - Esophagogastric landmarks identified.                           - 2 cm hiatal hernia.                           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 Biopsied.                           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                           - Multiple gastric polyps. Likely fundic gland                            polyps. Biopsied.                           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 Treated with argon plasma  coagulation (APC).                           - Normal stomach otherwise.                           - Normal examined duodenum. Moderate Sedation:      No moderate sedation, case performed with MAC Recommendation:           - Patient has a contact number available for                            emergencies. The signs and symptoms of potential                            delayed complications were discussed with the                            patient. Return to normal activities tomorrow.                            Written discharge instructions were provided to the                            patient.                           - Resume previous diet.                           - Continue present medications.                           - Start fluconazole 400mg  x 1 dose, then 200mg  /                             day for another 20 days                           - Await pathology results.                           - Repeat EGD with dilation in 3--4 weeks or so if                            possible, will need to coordinate with Dr.                            Barron Alvine (primary GI MD) or me Procedure Code(s):        --- Professional ---                           [phone], 59, Esophagogastroduodenoscopy, flexible,                            transoral; with control of bleeding, any method  16109, Esophagogastroduodenoscopy, flexible,                            transoral; with biopsy, single or multiple Diagnosis Code(s):        --- Professional ---                           K44.9, Diaphragmatic hernia without obstruction or                            gangrene                           K22.9, Disease of esophagus, unspecified                           K22.2, Esophageal obstruction                           K31.7, Polyp of stomach and duodenum                           K31.811, Angiodysplasia of stomach and duodenum                            with bleeding                           R13.10, Dysphagia, unspecified CPT copyright 2022 American Medical Association. All rights reserved. The codes documented in this report are preliminary and upon coder review may  be revised to meet current compliance requirements. Viviann Spare P. Adela Lank, MD 12/26/2023 8:06:44 AM This report has been signed electronically. Number of Addenda: 0

## 2023-12-26 NOTE — Interval H&P Note (Addendum)
 History and Physical Interval Note: Seen in the office 12/19/23 - no interval changes. EGD to further evaluate dysphagia and abnormal MBS. She is also having some odynophagia and GERD, on multiple antacids. Plan for EGD with dilation at the hospital today. She is on supplemental oxygen which is why her case is being done in the hospital setting with anesthesia support. Discussed risks of the exam to include worsening of her cardiopulmonary status, she reports at baseline today and no breathing issues. Risks of dilation include pain, bleeding, perforation, etc, she understands this and wants to proceed. Further recommendations pending the results. All questions answered.    Of note her exam is unchanged - lungs clear  12/26/2023 7:18 AM  Clement Sayres  has presented today for surgery, with the diagnosis of GERD, esophageal dysphagia.  The various methods of treatment have been discussed with the patient and family. After consideration of risks, benefits and other options for treatment, the patient has consented to  Procedure(s): EGD (ESOPHAGOGASTRODUODENOSCOPY) (N/A) as a surgical intervention.  The patient's history has been reviewed, patient examined, no change in status, stable for surgery.  I have reviewed the patient's chart and labs.  Questions were answered to the patient's satisfaction.     Viviann Spare P Miara Emminger

## 2023-12-26 NOTE — Telephone Encounter (Signed)
 Pharmacy Patient Advocate Encounter  Received notification from [facility] that Prior Authorization for Dexlansoprazole 60MG  dr capsule has been APPROVED from 12-24-2023 to 12-23-2024   PA #/Case ID/Reference #: [id number]

## 2023-12-26 NOTE — Transfer of Care (Signed)
 Immediate Anesthesia Transfer of Care Note  Patient: [name]  Procedure(s) Performed: EGD (ESOPHAGOGASTRODUODENOSCOPY) EGD, WITH ARGON PLASMA COAGULATION BIOPSY, SKIN, SUBCUTANEOUS TISSUE, OR MUCOUS MEMBRANE  Patient Location: PACU  Anesthesia Type:MAC  Level of Consciousness: drowsy  Airway & Oxygen Therapy: Patient Spontanous Breathing  Post-op Assessment: Report given to RN  Post vital signs: Reviewed and stable  Last Vitals:  Vitals Value Taken Time  BP 112/62 12/26/23 0759  Temp 36.3 C 12/26/23 0757  Pulse 78 12/26/23 0800  Resp 19 12/26/23 0800  SpO2 100 % 12/26/23 0800  Vitals shown include unfiled device data.  Last Pain:  Vitals:   12/26/23 0757  TempSrc: Temporal  PainSc:          Complications: No notable events documented.

## 2023-12-27 ENCOUNTER — Encounter (HOSPITAL_COMMUNITY): Payer: Self-pay | Admitting: Gastroenterology

## 2023-12-27 ENCOUNTER — Other Ambulatory Visit: Payer: Self-pay | Admitting: Adult Health

## 2023-12-27 DIAGNOSIS — F331 Major depressive disorder, recurrent, moderate: Secondary | ICD-10-CM

## 2023-12-27 DIAGNOSIS — F411 Generalized anxiety disorder: Secondary | ICD-10-CM

## 2023-12-27 NOTE — Anesthesia Postprocedure Evaluation (Signed)
 Anesthesia Post Note  Patient: [name]  Procedure(s) Performed: EGD (ESOPHAGOGASTRODUODENOSCOPY) EGD, WITH ARGON PLASMA COAGULATION BIOPSY, SKIN, SUBCUTANEOUS TISSUE, OR MUCOUS MEMBRANE     Patient location during evaluation: PACU Anesthesia Type: MAC Level of consciousness: awake and alert Pain management: pain level controlled Vital Signs Assessment: post-procedure vital signs reviewed and stable Respiratory status: spontaneous breathing, nonlabored ventilation, respiratory function stable and patient connected to nasal cannula oxygen Cardiovascular status: stable and blood pressure returned to baseline Postop Assessment: no apparent nausea or vomiting Anesthetic complications: no   No notable events documented.  Last Vitals:  Vitals:   12/26/23 0809 12/26/23 0821  BP: (!) 107/58 120/83  Pulse: 87 86  Resp: (!) 24 17  Temp:    SpO2: 100% 100%    Last Pain:  Vitals:   12/26/23 0809  TempSrc:   PainSc: 0-No pain                 Kaslyn Richburg S

## 2023-12-30 ENCOUNTER — Ambulatory Visit (INDEPENDENT_AMBULATORY_CARE_PROVIDER_SITE_OTHER): Admitting: Psychiatry

## 2023-12-30 DIAGNOSIS — F411 Generalized anxiety disorder: Secondary | ICD-10-CM | POA: Diagnosis not present

## 2023-12-30 LAB — SURGICAL PATHOLOGY

## 2023-12-30 NOTE — Progress Notes (Signed)
 [facility] Counselor/Therapist Progress Note  Patient ID: [name], MRN: [id number],    Date: 12/30/2023  Time Spent: 50 minutes  Treatment Type: Individual Therapy   Virtual Visit via Telehealth Note: MyChart Video session Connected with patient by a telemedicine/telehealth application, with their informed consent, and verified patient privacy and that I am speaking with the correct person using two identifiers. I discussed the limitations, risks, security and privacy concerns of performing psychotherapy and the availability of in person appointments. I also discussed with the patient that there may be a patient responsible charge related to this service. The patient expressed understanding and agreed to proceed. I discussed the treatment planning with the patient. The patient was provided an opportunity to ask questions and all were answered. The patient agreed with the plan and demonstrated an understanding of the instructions. The patient was advised to call  our office if  symptoms worsen or feel they are in a crisis state and need immediate contact.   Therapist Location: office Patient Location: home  Reported Symptoms:  anxious, depression, angry, feeling "detached" at times except for her son and parents, some motivation   Mental Status Exam:  Appearance:   Casual     Behavior:  Appropriate, Sharing, and some agitation/anger at husband"the most"  Motor:  Affected by her medical issues  Speech/Language:   Clear and Coherent  Affect:  Depressed and angry, anxious  Mood:  angry, anxious, and depressed  Thought process:  goal directed  Thought content:    Denies obsessive thoughts  Sensory/Perceptual disturbances:    WNL  Orientation:  oriented to person, place, situation, month of year, year, stated date of March 29 th, and 2025  Attention:  Fair  Concentration:  Good and Fair  Memory:  "Not as good and am working with a Advertising copywriter of knowledge:   Fair   Insight:    Fair  Judgment:   Good and Fair  Impulse Control:  Good and Fair   Risk Assessment: Danger to Self:  No Self-injurious Behavior: No Danger to Others: No Duty to Warn:no Physical Aggression / Violence:No  Access to Firearms a concern: No  Gang Involvement:No   Subjective:  Patient today in telehealth session and reporting anxiety, resentment, anger, depression, and feeling some detachment. Frustrated and talked about this in further detail today, which seemed helpful. Processed more today about her relationship with her son, and also some "throat and gastrointestinal issues" that patient has experienced more recently. Reflecting more on her son and family and holding up pictures to share during our session today. Processed more of her anger, anxiety, depression, and some detachment. Had her wind chimes going in the background of session today as she was sitting on her porch during most of session, and commented they have a peaceful feeling for her. Reporting some decreased anger by session end, and feeling more heard and supported in the midst of her anger, fears, and not feeling as alone.  Interventions: Cognitive Behavioral Therapy, Ego-Supportive, and Grief Therapy  Long-term goal:  Patient will confront her anxiety, fears, anger reported re: her illness and use time in therapy and beyond to work on this, while receiving feedback and support from therapist. Help her determine what she feels is realistic for her considering her health challenges and difficult prognosis.  Short-term goal: Participate in therapy and initiate communication of needs and desires at this point in her struggle physically.  Strategies: Share and talk through anxiety, anger,  and depression that are experienced and work to find and use helpful coping tools to better manage those feelings.   Diagnosis:   ICD-10-CM   1. Generalized anxiety disorder  F41.1      Plan:  Patient showing good participation  in session today reporting anxiety, depression, anger, fears, resentment, and sometimes struggling with hope as addressed in last session.  Continue today sharing and talking through difficult thoughts and feelings related to her cancer and prognosis.  She continues to be able to get out and do certain things with family including attending ballgames and some travel including a trip coming up that the family has planned to include patient.  Participation was active today in session as she continued sharing and talking through difficult thoughts and feelings related to her cancer/prognosis and also part of what she is involved in with different family members and others that is helping her cope with her illness as much as possible.  This patient has made progress and is needing to continue her work with goal-directed behaviors to keep moving in a more forward direction as she tries to manage the challenges she has with her health and emotional wellbeing.  Goal review and progress/challenges noted with patient.  Next appointment within 2 to 3 weeks.   Mathis Fare, LCSW

## 2023-12-31 ENCOUNTER — Ambulatory Visit: Admitting: Psychiatry

## 2024-01-02 ENCOUNTER — Ambulatory Visit: Payer: BC Managed Care – PPO | Admitting: Psychiatry

## 2024-01-02 DIAGNOSIS — R911 Solitary pulmonary nodule: Secondary | ICD-10-CM | POA: Diagnosis not present

## 2024-01-02 DIAGNOSIS — G8222 Paraplegia, incomplete: Secondary | ICD-10-CM | POA: Diagnosis not present

## 2024-01-03 ENCOUNTER — Other Ambulatory Visit (HOSPITAL_COMMUNITY): Payer: Self-pay

## 2024-01-03 ENCOUNTER — Other Ambulatory Visit (HOSPITAL_COMMUNITY): Payer: Self-pay | Admitting: Endocrinology

## 2024-01-03 ENCOUNTER — Encounter: Payer: Self-pay | Admitting: Hematology and Oncology

## 2024-01-03 DIAGNOSIS — R509 Fever, unspecified: Secondary | ICD-10-CM

## 2024-01-03 DIAGNOSIS — R52 Pain, unspecified: Secondary | ICD-10-CM

## 2024-01-04 ENCOUNTER — Other Ambulatory Visit (HOSPITAL_COMMUNITY): Payer: Self-pay

## 2024-01-06 ENCOUNTER — Ambulatory Visit (HOSPITAL_COMMUNITY)
Admission: RE | Admit: 2024-01-06 | Discharge: 2024-01-06 | Disposition: A | Discharge status: Home / Self Care | Source: Ambulatory Visit | Attending: Endocrinology | Admitting: Endocrinology

## 2024-01-06 ENCOUNTER — Other Ambulatory Visit (HOSPITAL_COMMUNITY): Payer: Self-pay

## 2024-01-06 DIAGNOSIS — E271 Primary adrenocortical insufficiency: Secondary | ICD-10-CM | POA: Insufficient documentation

## 2024-01-06 DIAGNOSIS — Z452 Encounter for adjustment and management of vascular access device: Secondary | ICD-10-CM | POA: Insufficient documentation

## 2024-01-06 DIAGNOSIS — R52 Pain, unspecified: Secondary | ICD-10-CM | POA: Diagnosis not present

## 2024-01-06 DIAGNOSIS — T82598A Other mechanical complication of other cardiac and vascular devices and implants, initial encounter: Secondary | ICD-10-CM | POA: Diagnosis not present

## 2024-01-06 DIAGNOSIS — R509 Fever, unspecified: Secondary | ICD-10-CM | POA: Insufficient documentation

## 2024-01-06 HISTORY — PX: IR FLUORO GUIDE CV LINE RIGHT: IMG2283

## 2024-01-06 MED ORDER — HEPARIN SOD (PORK) LOCK FLUSH 100 UNIT/ML IV SOLN
INTRAVENOUS | Status: AC
  Filled 2024-01-06: qty 5

## 2024-01-06 MED ORDER — LIDOCAINE-EPINEPHRINE 1 %-1:100000 IJ SOLN
INTRAMUSCULAR | Status: AC
  Filled 2024-01-06: qty 1

## 2024-01-06 MED ORDER — LIDOCAINE-EPINEPHRINE 1 %-1:100000 IJ SOLN
20.0000 mL | Freq: Once | INTRAMUSCULAR | Status: AC
  Administered 2024-01-06: 10 mL via INTRADERMAL

## 2024-01-06 MED ORDER — HEPARIN NA (PORK) LOCK FLSH PF 100 UNIT/ML IV SOLN
INTRAVENOUS | 3 refills | Status: DC
Start: 2024-01-06 — End: 2024-04-02
  Filled 2024-01-06 – 2024-01-09 (×2): qty 150, 30d supply, fill #0
  Filled 2024-02-07: qty 150, 30d supply, fill #1
  Filled 2024-02-28: qty 150, 30d supply, fill #2
  Filled 2024-03-08: qty 150, 30d supply, fill #3

## 2024-01-06 MED ORDER — HEPARIN SOD (PORK) LOCK FLUSH 100 UNIT/ML IV SOLN
500.0000 [IU] | Freq: Once | INTRAVENOUS | Status: AC
Start: 2024-01-06 — End: 2024-01-06
  Administered 2024-01-06: 500 [IU]

## 2024-01-06 NOTE — Procedures (Signed)
 Vascular and Interventional Radiology Procedure Note  Patient: [name] DOB: Oct 29, 1970 Medical Record Number: [id number] Note Date/Time: 01/06/24 9:20 AM   Performing Physician: Roanna Banning, MD Assistant(s): None  Diagnosis: Poor IV access. Hx Addison Dx   Procedure: TUNNELED CENTRAL VENOUS CATHETER EXCHANGE   Anesthesia: Local Anesthetic Complications: None Estimated Blood Loss:  0 mL Specimens:  None   Findings:  Successful exchange of right-sided, 19 cm (tip-to-cuff), tunneled CVC with the tip of the catheter in the proximal right atrium.   Plan: Catheter ready for use.  See detailed procedure note with images in PACS. The patient tolerated the procedure well without incident or complication and was returned to Recovery in stable condition.    Roanna Banning, MD [facility] Radiology   Pager. [phone] Clinic. [phone]

## 2024-01-07 ENCOUNTER — Other Ambulatory Visit: Payer: Self-pay

## 2024-01-07 ENCOUNTER — Telehealth (HOSPITAL_BASED_OUTPATIENT_CLINIC_OR_DEPARTMENT_OTHER): Payer: Self-pay | Admitting: Pulmonary Disease

## 2024-01-07 DIAGNOSIS — B3781 Candidal esophagitis: Secondary | ICD-10-CM | POA: Insufficient documentation

## 2024-01-07 DIAGNOSIS — K2100 Gastro-esophageal reflux disease with esophagitis, without bleeding: Secondary | ICD-10-CM | POA: Insufficient documentation

## 2024-01-07 NOTE — Telephone Encounter (Signed)
**Note De-identified  [place] Obfuscation** Please advise 

## 2024-01-07 NOTE — Telephone Encounter (Signed)
 Called patient regarding oxygen. She does not have a recent up to date ambulatory O2. Offered appointment for tomorrow AM at 8:30 for ambulatory O2.  Patient expressed disappointment that prescription could not immediately be provided since she has been on oxygen 2016 and asked questions regarding qualifications for oxygen. Explained she needs have O2 sats <88% on room with ambulation.   She expressed concerns about not qualifying and once again I re-iterated that unfortunately insurance would not acknowledge a prescription without a formal ambulatory O2 that demonstrated desaturations.  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 She queried why she was treated with bronchodilators and chest vest therapy but confirmed with patient that none of this treatments required documentation of hypoxemia.  On last walk test on 09/11/23 she had an episode of confusion at at that time and  left AMA after recommendations for ED evaluation. Advised patient that we will plan to take precautionary measures during walk test.  Mechele Collin, M.D. [facility] Pulmonary/Critical Care Medicine 01/07/2024 4:59 PM

## 2024-01-07 NOTE — Telephone Encounter (Signed)
 Copied from CRM [id number]. Topic: Clinical - Prescription Issue >> Jan 07, 2024  2:59 PM Nila Nephew wrote: Reason for CRM: Patient stating that, as they will be in [place] shortly, a annual prescription for oxygen needs to be faxed to the location loaning the portable oxygen down there. Can be faxed to [phone]. >> Jan 07, 2024  3:18 PM Nila Nephew wrote: Lisette Abu with the [employer] calling to request signed orders for patient's O2. Lisette Abu states correct fax number is [phone].   Please advise

## 2024-01-07 NOTE — Addendum Note (Signed)
 Addended by: Marisa Sprinkles on: 01/07/2024 01:22 PM   Modules accepted: Orders

## 2024-01-08 ENCOUNTER — Encounter (HOSPITAL_BASED_OUTPATIENT_CLINIC_OR_DEPARTMENT_OTHER): Payer: Self-pay | Admitting: Pulmonary Disease

## 2024-01-08 ENCOUNTER — Ambulatory Visit (HOSPITAL_BASED_OUTPATIENT_CLINIC_OR_DEPARTMENT_OTHER): Admitting: Pulmonary Disease

## 2024-01-08 VITALS — BP 128/78 | HR 108 | Ht 65.0 in | Wt 166.0 lb

## 2024-01-08 DIAGNOSIS — R0602 Shortness of breath: Secondary | ICD-10-CM | POA: Diagnosis not present

## 2024-01-08 MED ORDER — SPIRIVA RESPIMAT 1.25 MCG/ACT IN AERS: 2.0000 | INHALATION_SPRAY | Freq: Every day | RESPIRATORY_TRACT | 11 refills | Status: DC

## 2024-01-08 MED ORDER — FLUTICASONE-SALMETEROL 230-21 MCG/ACT IN AERO: 2.0000 | INHALATION_SPRAY | Freq: Two times a day (BID) | RESPIRATORY_TRACT | 11 refills | Status: DC

## 2024-01-08 MED ORDER — ALBUTEROL SULFATE HFA 108 (90 BASE) MCG/ACT IN AERS
2.0000 | INHALATION_SPRAY | Freq: Four times a day (QID) | RESPIRATORY_TRACT | 6 refills | Status: DC | PRN
Start: 2024-01-08 — End: 2024-06-28

## 2024-01-08 NOTE — Progress Notes (Signed)
 Subjective:   PATIENT ID: [name] GENDER: female DOB: 1971-03-18, MRN: [id number]   HPI  Chief Complaint  Patient presents with   Follow-up    SOB with exertion, prod cough with yellow sputum and wheezing,.    Reason for Visit: Follow-up  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  Synopsis: Previously followed at [facility] for cough variant asthma, subcentimeter lung nodules (stable) and chronic hypoxemia of unknown etiology. Patient had previously had concerns for pulmonary fibrosis however after no evidence of parenchymal disease on chest imaging. 2020 - Initially consulted with [facility] for hemoptysis. Work-up including bronchoscopy and CTA were negative and she has not had significant recurrence since then except mild episodes during exacerbations. Started on Nucala in 09/2019 for asthma however lost to follow-up from December 2020 to April 2021 2021 - Multiple hospitalizations including for kidney stone pain, migraine, bacteremia 2/2 port infection, LUE cellulitis 2022 - Re-established care and scheduled to start Nucala however unable to start. Had COVID and received antibody infusion 2023 - Had 3 hospital admissions including line associated bacteremia requiring port removal, readmission with sepsis, AKI and recently pneumonia in October  10/15/22 Since our last visit she was hospitalized for cellulitis of the left hand in December. Required stress dose steroids  in setting of adrenal insufficiency. Had tunneled placed for comfort and is seeing palliative care. She is compliant with her Advair and Spiriva, tolerating new inhaler well. Compliant with home O2. Chest vest was received and she reports using it three times a day with improved sputum production and breathing after use.  05/16/23 Since our last visit she was seen by palliative and enrolled into hospice for symptom management and then was discharged in April after deeming her life expectancy was >6 months after April evaluation. She was hospitalized in April for fevers with negative work-up. She was hospitalized again in June for respiratory failure from presumed pneumonia with improvement in 48 hours. Recently hospitalized in July for nausea and vomiting and weakness for possible adrenal crisis and sepsis secondary to bacillus bacteremia. ID recommended port removal however patient declined. She reports her pain has been less controlled since she is no longer in hospice and feels her overall quality of life has declined due to this. She was considered in re-enrollment for hospice on 03/22/23 and reports that she prefers to avoid hospitalizations however no further notes after this.   She reports her shortness of breath and feels albuterol with partial improvement 4-5 times and nebulizers once a night. Her symptoms worsen at night and will need nebulizer at night. Uses chest vest intermittently and trying to use it more often. Reports difficulty swallowing and weight loss.   09/11/23 Patient presented to Pulmonary clinic for appointment and ambulatory O2. During ambulatory O2 walk she had episode of confusion. She was unable to recognize me and not appropriately responsive to questions. She asked "Where am I? I came here for a walk test?" She did not respond to questions and unable to respond to questions for about five minutes. Husband of patient  was called and he replied that she has had these sporadic  episodes for the last three months and followed by Neurologist. I have advised patient to go to the ED for work-up. Her husband is aware of the recommendations. Repeat vitals stable. She has left the office against medical advice prior being transported to the ED. I have advised her to not drive home due to safety. She states her husband will pick her up and leave the vehicle here.  01/08/24 Since our last visit she has been wearing her oxygen. She has previously on 4-5L and weaned herself to 3L with self-reported sats to 93%. She expresses concern about having her oxygen being discontinued. Planning to drive to [facility] for a trip tomorrow. [employer] has requested for a signed O2 orders and [name] presents for ambulatory O2. She continues to have shortness of breath and wheezing with activity. Uses albuterol and nebulizer throughout the day. Compliance with chest vest for productive cough.  Prior Inhalers: Breo Incruse (cost too high) Advair - current  Social History: Has two adult daughters Cares for her teenage son with atypical cerebral palsy  Environmental exposures:  Hx chemotherapy  CareEverywhere Records Reviewed and Summarized: Pulm at [facility]      09/18/17 Bronchoscopy with BAL. Negative cultures and negative cytology.      Dr. Cheri Rous documented on 01/31/18 that patient's chest imaging was presented in 10/2017 and her bronchial fibroelastotic scar was felt to be a nonspecific injury related to reflux or prior XRT and not related to her chronic cough. 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  recurrence. Currently has monthly port flushes.      The note also comments on PCP work-up comments on patient reporting hematuria with UA showing 1+ blood however this is not seen on recent PCP note.  IM at [facility] -       Per  PCP note on 03/17/19, patient previously seen by Cardiology for CHF with right  heart failure and pH with EF 35% however EF normalized on echo 11/2018  Past Medical History:  Diagnosis Date   Addison's disease [facility])    Adrenal insufficiency (HCC)    Anemia    Anxiety    Aortic stenosis    Aortic stenosis    Appendicitis 12/19/2009   Appendicitis    Breast cancer (HCC)    STATUS POST BILATERAL MASTECTOMY. STATUS POST RECONSTRUCTION. SHE HAD SILICONE BREAST IMPLANTS AND THE LEFT IMPLANT IS LEAKING SLIGHTLY   Cellulitis of right middle finger 11/07/2018   Cervical cancer (HCC) 12/23/2018   Chest pain    CHF with right heart failure (HCC) 04/17/2017   Chronic respiratory failure with hypoxia (HCC) 12/23/2018   CKD (chronic kidney disease) stage 3, GFR 30-59 ml/min (HCC)    Cough variant asthma 04/13/2019   Depression    Functional neurological symptom disorder with mixed symptoms 2023   GERD (gastroesophageal reflux disease)    takes Dexilant and carafate and gi coctail    Headache    migraines on a daily and monthly regimen    Heart murmur    History of kidney stones    Hodgkin lymphoma (HCC)    STATUS POST MANTLE RADIATION   Hodgkin's lymphoma (HCC)    1987   Hypertension    Hypoxia    Multiple lung nodules    bilateral   Necrotizing fasciitis (HCC) 12/23/2018  Non-ischemic cardiomyopathy (HCC)    Osteoporosis    Ovarian tumor (benign)    bilateral   Palpitations    Pituitary adenoma (HCC) 12/23/2018   Pneumonia    PONV (postoperative nausea and vomiting)    Pre-diabetes    per pt; no meds   Pulmonary hypertension (HCC) 12/23/2018   Raynaud phenomenon    Right heart failure (HCC) 04/17/2017   Seizures (HCC)    last febrile seizure was approx 3 weeks ago per report on 12/01/2020   Supplemental oxygen dependent    3 liters   SVT (supraventricular tachycardia) (HCC)    Tachycardia    Thyroid cancer (HCC)    STATUS POST SURGICAL REMOVAL-CURRENT ON THYROID REPLACEMENT     Allergies  Allergen Reactions   Ferrous Bisglycinate Chelate [Iron]  Anaphylaxis and Other (See Comments)    Only IV - only FERRLICET   Mushroom Extract Complex (Obsolete) Anaphylaxis   Na Ferric Gluc Cplx In Sucrose Anaphylaxis   Cymbalta [Duloxetine Hcl] Swelling and Anxiety   Hydromorphone Other (See Comments)    BP drop and heart rate drops 5.1.20 PT REPORTS THAT SHE TAKES DILAUDID AT HOME   Ondansetron Hcl Other (See Comments)   Promethazine Other (See Comments)    Other reaction(s): Unknown   Succinylcholine Other (See Comments)    Lock Jaw   Buprenorphine Hcl Hives   Compazine Other (See Comments)    Altered mental status Aggression   Duloxetine     Other reaction(s): Not available   Metoclopramide Other (See Comments)    Dystonia   Morphine And Codeine Hives   Ondansetron Hives and Rash    Other reaction(s): Unknown Other reaction(s): Unknown   Promethazine Hcl Hives   Tegaderm Ag Mesh [Silver] Rash    Old formulation only, is able to tolerate new formulation     Outpatient Medications Prior to Visit  Medication Sig Dispense Refill   albuterol (PROVENTIL) (2.5 MG/3ML) 0.083% nebulizer solution INHALE 3 ML BY NEBULIZATION EVERY 6 HOURS AS NEEDED FOR WHEEZING OR SHORTNESS OF BREATH 225 mL 1   ALPRAZolam (XANAX) 1 MG tablet 1 tablet daily and 1 tablet PRN no more than 10 days a month. 40 tablet 0   aspirin EC 81 MG tablet Take 1 tablet (81 mg total) by mouth daily. Swallow whole. (Patient taking differently: Take 81 mg by mouth at bedtime. Chewable) 90 tablet 3   Bismuth Tribromoph-Petrolatum (XEROFORM OCCLUSIVE GAUZE STRIP) PADS Apply 1 each topically as directed. 50 each 1   bisoprolol (ZEBETA) 5 MG tablet TAKE 1/2 TABLET BY MOUTH DAILY 8 tablet 0   bumetanide (BUMEX) 1 MG tablet TAKE 1 TABLET BY MOUTH TWICE A DAY 30 tablet 0   buPROPion (WELLBUTRIN XL) 150 MG 24 hr tablet Take 1 tablet (150 mg total) by mouth daily. 90 tablet 0   clonazePAM (KLONOPIN) 1 MG tablet Take 1 tablet (1 mg total) by mouth 2 (two) times daily. 60 tablet 2    cyclobenzaprine (FLEXERIL) 5 MG tablet Take 1 tablet (5 mg total) by mouth 3 (three) times daily as needed for muscle spasms. 20 tablet 0   dexlansoprazole (DEXILANT) 60 MG capsule Take 1 capsule (60 mg total) by mouth daily. 90 capsule 3   diclofenac (VOLTAREN) 50 MG EC tablet Take 50 mg by mouth 2 (two) times daily. *as needed for migraine, Can repeat if needed*     diphenhydrAMINE (BENADRYL) 12.5 MG/5ML elixir Take 10 mLs (25 mg total) by mouth every 6 (six) hours as  needed (nausea). 120 mL 0   EMGALITY 120 MG/ML SOSY Inject 120 mg into the skin every 28 (twenty-eight) days.     EPINEPHRINE 0.3 mg/0.3 mL IJ SOAJ injection INJECT 0.3 MG INTO THE MUSCLE AS NEEDED FOR ANAPHYLAXIS. 2 each 1   famotidine (PEPCID) 40 MG/5ML suspension TAKE 1.3 MLS (10.4 MG TOTAL) BY MOUTH DAILY - DISCARD BOTTLE AFTER 30 DAYS FROM PICKUP 50 mL 0   FLORASTOR 250 MG capsule Take 250 mg by mouth 2 (two) times daily. Mid Morning and Mid Afternoon     fluconazole (DIFLUCAN) 200 MG tablet Take 1 tablet (200 mg total) by mouth daily. 2 tabs PO x once, and then one tab daily for another 20 days 22 tablet 0   FLUoxetine (PROZAC) 20 MG capsule TAKE 1 CAPSULE BY MOUTH EVERY DAY 90 capsule 0   Heparin Na, Pork, Lock Flsh PF (BD HEPARIN POSIFLUSH) 100 UNIT/ML SOLN USE 5 MLS IN PORT A CATH ONCE DAILY AFTER MEDICATION ADMINISTRATION AS A HEPLOCK AS DIRECTED. 150 mL 3   hydrocortisone (CORTEF) 10 MG tablet Take 1-2 tablets (10-20 mg total) by mouth See admin instructions. Take 20 mg in the am and 10mg  in the evening. Take after completing prednisone (Patient taking differently: Take 10-20 mg by mouth See admin instructions. Take 20 mg in the am at 10 am and 10 mg in the afternoon. Take after completing prednisone)     hydrOXYzine (ATARAX) 25 MG tablet Take 1 tablet (25 mg total) by mouth every 6 (six) hours as needed for anxiety. 30 tablet 0   hyoscyamine (LEVSIN/SL) 0.125 MG SL tablet Place 1 tablet (0.125 mg total) under the tongue every  6 (six) hours as needed (esophageal spasm). Up to 1.25 mg daily 30 tablet 1   lamoTRIgine (LAMICTAL) 200 MG tablet TAKE 1 TABLET BY MOUTH EVERYDAY AT BEDTIME 90 tablet 0   lamoTRIgine (LAMICTAL) 25 MG tablet Take two tablets every morning. 180 tablet 0   Lancets (ONETOUCH DELICA PLUS LANCET33G) MISC 3 (three) times daily. for testing     lansoprazole (PREVACID) 15 MG capsule Take 30 mg by mouth in the morning and at bedtime.     lidocaine (XYLOCAINE) 2 % solution Use as directed 10 mLs in the mouth or throat every 4 (four) hours as needed (for esophageal pain and pain with swallowing.). 100 mL 1   LORazepam (ATIVAN) 0.5 MG tablet Take 1 tablet (0.5 mg total) by mouth 3 (three) times daily as needed for anxiety. (Patient taking differently: Place 0.5 mg into feeding tube 3 (three) times daily as needed for anxiety. Access Port For Nausea) 12 tablet 0   nitroGLYCERIN (NITROSTAT) 0.4 MG SL tablet DISSOLVE 1 TAB UNDER THE TONGUE EVERY 5 MINUTES AS NEEDED FOR CHEST PAIN. MAX OF 3 DOSES, THEN 911. 75 tablet 0   NURTEC 75 MG TBDP Take 1 tablet by mouth daily as needed.     OLANZapine (ZYPREXA) 5 MG tablet TAKE 1 TABLET BY MOUTH EVERYDAY AT BEDTIME 30 tablet 0   ONETOUCH VERIO test strip 3 (three) times daily. for testing     OXYGEN Inhale 4-5 L/min into the lungs continuous.     pantoprazole (PROTONIX) 40 MG tablet TAKE 1 TABLET BY MOUTH EVERY DAY 90 tablet 1   PROAIR HFA 108 (90 Base) MCG/ACT inhaler Inhale 2 puffs into the lungs every 4 (four) hours as needed for wheezing or shortness of breath.     QVAR REDIHALER 80 MCG/ACT inhaler Inhale 1 puff into  the lungs 2 (two) times daily.     RABEprazole (ACIPHEX) 20 MG tablet Take 1 tablet (20 mg total) by mouth daily. 30 tablet 0   rosuvastatin (CRESTOR) 10 MG tablet Take 10 mg by mouth in the morning. Mid morning     sacubitril-valsartan (ENTRESTO) 24-26 MG TAKE 1 TABLET BY MOUTH TWICE A DAY (Patient taking differently: Take 1 tablet by mouth daily.) 60  tablet 5   Sodium Chloride Flush (NORMAL SALINE FLUSH) 0.9 % SOLN Use as directed for central venous catheter maintenance 600 mL 3   Sodium Chloride Flush (NORMAL SALINE FLUSH) 0.9 % SOLN Use as directed for catheter maintenance 600 mL 3   Sodium Chloride Flush (NORMAL SALINE FLUSH) 0.9 % SOLN Use as directed for catheter maintenance 600 mL 3   sucralfate (CARAFATE) 1 GM/10ML suspension Take 1 g by mouth daily as needed (as directed for ulcers).     SYNTHROID 100 MCG tablet Take 1 tablet (100 mcg total) by mouth every morning. (Patient taking differently: Take 100 mcg by mouth every morning. * Must be name brand*)     topiramate (TOPAMAX) 50 MG tablet Take 150 mg by mouth at bedtime.     zolpidem (AMBIEN CR) 12.5 MG CR tablet TAKE ONE TABLET BY MOUTH AT BEDTIME AS NEEDED FOR SLEEP. 90 tablet 0   zonisamide (ZONEGRAN) 100 MG capsule Take 100 mg by mouth daily.     doxycycline (VIBRA-TABS) 100 MG tablet Take 1 tablet (100 mg total) by mouth 2 (two) times daily. 14 tablet 0   Tiotropium Bromide Monohydrate (SPIRIVA RESPIMAT) 1.25 MCG/ACT AERS Inhale 2 puffs into the lungs daily. 4 g 5   famotidine (PEPCID) 40 MG/5ML suspension Take 2.5 mLs (20 mg total) by mouth daily. 75 mL 0   No facility-administered medications prior to visit.    Review of Systems  Constitutional:  Negative for chills, diaphoresis, fever, malaise/fatigue and weight loss.  HENT:  Negative for congestion.   Respiratory:  Positive for cough, sputum production and shortness of breath. Negative for hemoptysis and wheezing.   Cardiovascular:  Negative for chest pain, palpitations and leg swelling.    Objective:   Vitals:   01/08/24 0830  BP: 128/78  Pulse: (!) 108  SpO2: 98%  Weight: 166 lb (75.3 kg)  Height: 5\' 5"  (1.651 m)     SpO2: 98 %  Physical Exam: General: Well-appearing, no acute distress HENT: [place], AT Eyes: EOMI, no scleral icterus Respiratory: Clear to auscultation bilaterally.  No crackles, wheezing or  rales Cardiovascular: RRR, -M/R/G, no JVD Extremities:-Edema,-tenderness Neuro: AAO x4, CNII-XII grossly intact Psych: Normal mood, normal affect  Data Reviewed:  Imaging: CXR 04/12/06 - Normal CXR. No infiltrate, edema or effusion. CT 05/05/09 - Normal parenchymal. Scattered tiny pulmonary nodules with largest 3mm in LUL likely benign CT Chest 09/05/17 (report only): Stable bilateral pulmonary nodules up to 4mm (LUL 3mm, LUL 2 mm, RLL subpleural 4mm), redemonstration of tiny right diaphragmatic hernia containing fat CT Chest 11/10/18 - RML 5 mm lung nodule, unchanged CT Chest HR 04/04/20 - Mild air trapping. Air trapping on expiration. No ILD. CT Chest 08/13/22 - Air trapping. 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 Subcentimeter nodules <5 mm. CT CAP 01/23/23 - No acute abnormalities, trace bilateral pleural effusions, 11 mm indeterminate cortical hypodensity within the posterior left interpolar region. CXR 09/03/23 - Bibasilar atelectasis CXR 12/22/23 - Bibasilar atelectasis  PFT: 04/16/19 FVC 2.42 (65%) FEV1 2.03 (68%) Ratio 76  TLC 84% DLCO 75% Interpretation: Moderate reversible obstructive defect.  Significant BD response in FEV1.  CPET Per [facility] records  CPET 03/2016 showed functional impairment with VO42max 65% predicted. Normal oxygenation throughout on room air with post-exercise PaO2=125. HR 82% predicted. Abnormal FEV1 but ventilatory reserve remained. Conclusion: Cardiovascular limitation to exercise possibly reflecting deconditioning. Ventilatory impairment present but not exercise-limiting.  6 min walk  Per [facility] records (12/07/16): SpO2 started at 97-98% and decreased to ~94% with ambulation. There was one episode of symptomatic dizziness during which SpO2 was 88% but with questionable tracing. Recovered to high-90s with ~10-15 seconds of standing rest break.  [facility] 08/13/19  Unable to complete . Patient had episode of coughing while walking and dizziness resulting in  witnessed fall. Staff reported patient was disoriented and confused.  POC glucose was 109. Pulse oximetry >96% and tachycardia 110. No desaturations documented during this time.  Ambulatory O2 Titration 04/13/19 - Patient unable to complete due to dizziness with ambulation. No desaturations documented. SpO2>95% in the time titration was attempted on room air. Per nursing, patient anxious and holding breath during testing which likely contributed to early termination of test  09/11/23 - Nadir SpO2 90%. Did not qualify for O2 on this visit  Labs:  ANA Screen 07/03/17 - Positive  Feb 2020 ANA - neg SSA/SSB - neg Anti-DNA ab -neg MPO/PR3 ab - ng GBM ab - neg Anti-scleroderma ab - neg APLAS - neg ANCA titers - neg RF mildly elevated 18.6  Bronchoscopy: 11/19/18   11/19/18 TBFNA station 7 (mislabeled as bronchial brushing) - negative for malignant cells  11/19/18 Respiratory/fungal, AFB from BAL of L lingula - negative    Assessment & Plan:   Discussion: 53 year old female with complex medical history as noted below. Previously followed at [facility] for cough variant asthma, subcentimeter lung nodules (stable) and chronic hypoxemic of unknown etiology (normal lung parenchyma by biopsy and chest imaging).   She was initially established with [facility] for hemoptysis. Work-up including bronchoscopy and CTA were negative and she has not had significant recurrence since then except mild episodes during exacerbations. For her asthma she was started on Nucala in 09/2019. Lost to follow-up and re-established care in 2022. Attempted to restart Nucala however she had medical issues preventing restart.  Recent multiple hospitalizations including pneumonia in October 2023 and Dec 2023. Had multiple hospitalizations in April, June and July 2024 as well for multiple reasons including sepsis of unknown origin, respiratory failure, sepsis secondary to bacillus, concerning for contaminant. She had  declined port removal due to goals of care.  Continues to have persistent respiratory symptoms on triple inhalers and rescue inhaler/nebulizers, chest vest therapy and oxygen support. Likely multifactorial with chronic bronchitis in setting of asthma and severe deconditioning. Ambulatory O2 today with no desaturations. Patient reports oxygen support provides comfort.  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 Likely contributes to her inability to clear secretions well --CONTINUE Advair 230-21 mcg TWO puff TWICE a day --CONTINUE Spiriva 1.25 mcg TWO puffs a day. T --CONTINUE Albuterol AS NEEDED for shortness of breath or wheezing --Continue chest vest up to three times a day  History of chronic hypoxemic respiratory failure - resolved Currently wearing 3L at home. This is self-purchased Ambulatory O2 in-office with no desaturations with nadir SpO2 95%  on room air. No indication for oxygen at this time --Goal SpO2 >88%. Continue to monitor saturations --Regarding request from DME in [facility] for oxygen orders: I offered to contact representative to report she did not need oxygen at this time. However patient stated no further communication needed.  Right heart failure - Resolved. Last echo 04/25/23 with normalized EF and normal RV size and function --Previously followed by Cardiology. Last appointment 08/31/22  DNR/DNI --Discharged from Hospice.  Health Maintenance Immunization History  Administered Date(s) Administered   Fluad Quad(high Dose 65+) 06/30/2020   Influenza, High Dose Seasonal PF 06/19/2019   Influenza, Quadrivalent,  Recombinant, Inj, Pf 08/13/2018   Influenza, Seasonal, Injecte, Preservative Fre 06/23/2016, 11/12/2017, 08/13/2018   Influenza,inj,Quad PF,6+ Mos 07/04/2015, 07/04/2015, 06/23/2016, 07/03/2017, 11/12/2017, 08/13/2018   Influenza,inj,Quad PF,6-35 Mos 07/04/2015   Influenza,inj,quad, With Preservative 06/19/2019   Influenza-Unspecified 07/23/2007, 07/06/2008, 06/13/2009, 07/06/2010, 07/25/2011, 08/01/2012, 07/14/2013, 07/04/2015, 07/04/2015, 06/23/2016, 06/23/2016, 07/03/2017, 11/12/2017, 06/19/2019, 06/30/2020, 06/01/2021   Moderna Sars-Covid-2 Vaccination 12/15/2019, 01/12/2020   Pneumococcal Conjugate PCV 7 03/20/2019   Pneumococcal Conjugate-13 03/20/2019   Pneumococcal Polysaccharide-23 02/21/2017   Pneumococcal-Unspecified 02/21/2017   Td 12/19/2010   Td (Adult), 2 Lf Tetanus Toxid, Preservative Free 12/19/2010   Varicella 04/02/2007   CT Lung Screen - not qualified. Never smoker  No orders of the defined types were placed in this encounter.  Meds ordered this encounter  Medications   Tiotropium Bromide Monohydrate (SPIRIVA RESPIMAT) 1.25 MCG/ACT AERS    Sig: Inhale 2 puffs into the lungs daily.    Dispense:  4 g    Refill:  11   fluticasone-salmeterol (ADVAIR HFA) 230-21 MCG/ACT inhaler    Sig: Inhale 2 puffs into the lungs 2 (two) times daily.    Dispense:  1 each    Refill:  11   albuterol (VENTOLIN HFA) 108 (90 Base) MCG/ACT inhaler    Sig: Inhale 2 puffs into the lungs every 6 (six) hours as needed for wheezing or shortness of breath.    Dispense:  8 g    Refill:  6   Return in about 6 months (around 07/09/2024).  I have spent a total time of 38-minutes on the day of the appointment including chart review, data review, collecting history, coordinating care and discussing medical diagnosis and plan with the patient/family. Past medical history, allergies, medications were reviewed. Pertinent imaging, labs and tests included in this note have been reviewed and interpreted  independently by me  Jveon Pound Mechele Collin, MD [facility] 01/08/2024 9:28 AM

## 2024-01-08 NOTE — Patient Instructions (Addendum)
 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 Likely contributes to her inability to clear secretions well --CONTINUE Advair 230-21 mcg TWO puff TWICE a day --CONTINUE Spiriva 1.25 mcg TWO puffs a day. T --CONTINUE Albuterol AS NEEDED for shortness of breath or wheezing --Continue chest vest up to three times a day  Chronic hypoxemic respiratory failure Currently wearing 3L at home. This is self-purchased Ambulatory O2 in-office with no desaturations.  --Goal SpO2 >88%

## 2024-01-09 ENCOUNTER — Other Ambulatory Visit (HOSPITAL_COMMUNITY): Payer: Self-pay

## 2024-01-18 ENCOUNTER — Other Ambulatory Visit (HOSPITAL_COMMUNITY): Payer: Self-pay

## 2024-01-18 ENCOUNTER — Other Ambulatory Visit: Payer: Self-pay | Admitting: Adult Health

## 2024-01-18 DIAGNOSIS — F410 Panic disorder [episodic paroxysmal anxiety] without agoraphobia: Secondary | ICD-10-CM

## 2024-01-19 ENCOUNTER — Other Ambulatory Visit (INDEPENDENT_AMBULATORY_CARE_PROVIDER_SITE_OTHER): Payer: Self-pay | Admitting: Otolaryngology

## 2024-01-21 ENCOUNTER — Other Ambulatory Visit: Payer: Self-pay | Admitting: Adult Health

## 2024-01-21 ENCOUNTER — Ambulatory Visit (INDEPENDENT_AMBULATORY_CARE_PROVIDER_SITE_OTHER): Admitting: Psychiatry

## 2024-01-21 ENCOUNTER — Ambulatory Visit: Admitting: Psychiatry

## 2024-01-21 DIAGNOSIS — F411 Generalized anxiety disorder: Secondary | ICD-10-CM

## 2024-01-21 DIAGNOSIS — F331 Major depressive disorder, recurrent, moderate: Secondary | ICD-10-CM

## 2024-01-21 NOTE — Progress Notes (Signed)
 Patient unable to keep this appt and was reset for 12:00 noon appt today.

## 2024-01-21 NOTE — Progress Notes (Signed)
 [facility] Counselor/Therapist Progress Note  Patient ID: [name], MRN: [id number],    Date: 01/21/2024  Time Spent: 53 minutes   Treatment Type: Individual Therapy  Virtual Visit via Telehealth Note: MyChart Video session Connected with patient by a telemedicine/telehealth application, with their informed consent, and verified patient privacy and that I am speaking with the correct person using two identifiers. I discussed the limitations, risks, security and privacy concerns of performing psychotherapy and the availability of in person appointments. I also discussed with the patient that there may be a patient responsible charge related to this service. The patient expressed understanding and agreed to proceed. I discussed the treatment planning with the patient. The patient was provided an opportunity to ask questions and all were answered. The patient agreed with the plan and demonstrated an understanding of the instructions. The patient was advised to call  our office if  symptoms worsen or feel they are in a crisis state and need immediate contact.   Therapist Location: office Patient Location: home    Reported Symptoms: fear, panic, anxiety, depression, tearfulness, some frustrations with medical staff   Mental Status Exam:  Appearance:   Casual     Behavior:  Sharing and some motivational challenges  Motor:  Impacted by her health issues, currently in bed  Speech/Language:   Clear and Coherent  Affect:  Depressed  Mood:  anxious and depressed  Thought process:  goal directed  Thought content:    Rumination  Sensory/Perceptual disturbances:    WNL  Orientation:  oriented to person, place, time/date, situation, day of week, month of year, year, and stated date of January 21, 2024  Attention:  Fair  Concentration:  Good and Fair  Memory:  Affected by her cancer  Fund of knowledge:   Fair  Insight:    Good and Fair  Judgment:   Good  Impulse Control:  Good    Risk Assessment: Danger to Self:  No Self-injurious Behavior: No Danger to Others: No Duty to Warn:no Physical Aggression / Violence:No  Access to Firearms a concern: No  Gang Involvement:No   Subjective:  Patient today anxious, depressed, some tearfulness, and some fear/panic recently due to getting separated from family at service station/store after family trip to [facility].  Patient stated that she needed to talk through this and did so in session today.  She seemed to have felt stuck in her fear and tears and felt that was good that she was physically able to make this trip, although "had some stress" due to she and husband having a mixup on where he was going to be with the car and she states that she thought he had left and she was stranded.  Husband apparently had understood she was going to be in a different location and when he realized she was not he backtracked and found her at the store where they had been earlier and she was okay with other people who had stopped after seeing her crying and alone.  Processes and detail and she seemed to feel some relief. Reporting some "throat issues or fungus" and currently being medicated for this.  Processed specific issues that relate to her anxiety, fear/panic, and depression and seem to feel more calm and cared for by end of session. (Not all details included in this note due to patient privacy needs.)   Interventions: Cognitive Behavioral Therapy and Ego-Supportive  Long-term goal:  Patient will confront her anxiety, fears, anger reported re: her  illness and use time in therapy and beyond to work on this, while receiving feedback and support from therapist. Help her determine what she feels is realistic for her considering her health challenges and difficult prognosis.  Short-term goal: Participate in therapy and initiate communication of needs and desires at this point in her struggle physically.  Strategies: Share and talk through  anxiety, anger, and depression that are experienced and work to find and use helpful coping tools to better manage those feelings.  Diagnosis:   ICD-10-CM   1. Generalized anxiety disorder  F41.1      Plan: Patient today actively participating in session although reporting anxiety, fears, depression, anxiety, and some struggle with hope and resentment. Talked through difficult feelings related to her health and overall cancer prognosis and some family-related issues, adding that she was feeling better by end of session.  She is grateful that she is still able to get out and do certain things with her family including attending ballgames and some other travels as noted above.  Participates well in sessions and able to express herself clearly.  She has maintained her progress and is needing to continue her work with goal-directed behaviors to keep moving in a forward direction as she tries to manage the challenges of her cancer diagnosis and her overall emotional wellbeing.  Goal review and progress/challenges noted with patient.  Next appt within 1-2 weeks   Kelleen Patee, LCSW

## 2024-01-23 ENCOUNTER — Other Ambulatory Visit: Payer: Self-pay | Admitting: Adult Health

## 2024-01-23 DIAGNOSIS — F411 Generalized anxiety disorder: Secondary | ICD-10-CM

## 2024-01-23 DIAGNOSIS — F331 Major depressive disorder, recurrent, moderate: Secondary | ICD-10-CM

## 2024-01-24 ENCOUNTER — Other Ambulatory Visit (INDEPENDENT_AMBULATORY_CARE_PROVIDER_SITE_OTHER): Payer: Self-pay | Admitting: Otolaryngology

## 2024-01-26 ENCOUNTER — Other Ambulatory Visit: Payer: Self-pay | Admitting: Cardiovascular Disease

## 2024-01-27 ENCOUNTER — Telehealth: Payer: Self-pay | Admitting: Adult Health

## 2024-01-27 NOTE — Telephone Encounter (Signed)
 Patient is not due for a RF until 4/29, but does not have a RF available. Told her that a new script would be sent in tomorrow.

## 2024-01-27 NOTE — Telephone Encounter (Signed)
 Pt called 10:53 reporting Pharmacy advised denial on Alprazolam . Apt 6/2 Advise Pt

## 2024-01-27 NOTE — Telephone Encounter (Signed)
 She doesn't have a RF and last filled 4/1, not due for a RF until tomorrow. Will pend.

## 2024-01-28 ENCOUNTER — Other Ambulatory Visit: Payer: Self-pay | Admitting: Adult Health

## 2024-01-28 ENCOUNTER — Other Ambulatory Visit: Payer: Self-pay

## 2024-01-28 DIAGNOSIS — F410 Panic disorder [episodic paroxysmal anxiety] without agoraphobia: Secondary | ICD-10-CM

## 2024-01-28 MED ORDER — ALPRAZOLAM 1 MG PO TABS: ORAL_TABLET | ORAL | 0 refills | Status: DC

## 2024-01-28 NOTE — Telephone Encounter (Signed)
Pended alprazolam 

## 2024-01-29 ENCOUNTER — Other Ambulatory Visit (HOSPITAL_COMMUNITY): Payer: Self-pay

## 2024-01-29 ENCOUNTER — Telehealth: Payer: Self-pay

## 2024-01-29 ENCOUNTER — Ambulatory Visit: Payer: Self-pay

## 2024-01-29 DIAGNOSIS — J209 Acute bronchitis, unspecified: Secondary | ICD-10-CM

## 2024-01-29 DIAGNOSIS — J4550 Severe persistent asthma, uncomplicated: Secondary | ICD-10-CM

## 2024-01-29 NOTE — Telephone Encounter (Signed)
 Copied from CRM [id number]. Topic: Clinical - Prescription Issue >> Jan 29, 2024  2:52 PM Isabell A wrote: Reason for CRM: Patient states her nebulizer just broke - requesting a new prescription to [facility] for a replacement.

## 2024-01-29 NOTE — Telephone Encounter (Signed)
 Chief Complaint: SOB Symptoms: moderate-severe SOB, cough Frequency: this afternoon Pertinent Negatives: Patient denies fever, URI sx, CP Disposition: [x] ED /[] Urgent Care (no appt availability in office) / [] Appointment(In office/virtual)/ []  [facility]/ [] Home Care/ [] Refused Recommended Disposition /[] [facility]/ []  Follow-up with PCP Additional Notes: Pt c/o SOB. Pt has hx of respiratory failure with hypoxia and typically takes 3 duonebs daily. Per pt, neb machine would not turn on for afternoon tx. Husband attempted to go to DME company to have it exchanged but there was no order. Triager confirmed that order was placed by pulm, but now offices are closed and pt will be without neb machine for the remainder of the day. Triager strongly advised ED based on audible SOB so that she can have some relief in the interim. Pt declined. Pt reports having albuterol  INH rescue that she will use and is currently on 3L O2 with saturations above parameters. Pt wants to ensure that order is in so that DME company can exchange machine tomorrow. Triager again strongly recommended escalated level of care to provide immediate relief. Pt reports she is comfortable managing for now, and will seek emergent care if needed.   Copied from CRM [id number]. Topic: Clinical - Red Word Triage >> Jan 29, 2024  5:07 PM Eveleen Hinds B wrote: [facility] that prompted transfer to Nurse Triage: Diff Breathing Reason for Disposition  [1] MODERATE difficulty breathing (e.g., speaks in phrases, SOB even at rest, pulse 100-120) AND [2] NEW-onset or WORSE than normal  Answer Assessment - Initial Assessment Questions E2C2 Pulmonary Triage - Initial Assessment Questions "Chief Complaint (e.g., cough, sob, wheezing, fever, chills, sweat or additional symptoms) *Go to specific symptom protocol after initial questions. Hx Pulm failure with hypoxia Pt reports that she takes 3 duonebs daily, and neb machine would not  power on with afternoon does. Pt husband went to DME company to see if he could switch out machine, but unfortunately did not have order to switch out machine. Now pt without working machine and in respiratory distress.  "How long have symptoms been present?" This afternoon   MEDICINES:   "Have you used any OTC meds to help with symptoms?" Yes If yes, ask "What medications?" Mucinex   "Have you used your inhalers/maintenance medication?" Yes If yes, "What medications?" 3 duonebs a day - pt reports neb machine will not power on  If inhaler, ask "How many puffs and how often?" Note: Review instructions on medication in the chart. Reports using respiratory rx as prescribed  OXYGEN : "Do you wear supplemental oxygen ?" Yes If yes, "How many liters are you supposed to use?" 3L ATC  "Do you monitor your oxygen  levels?" Yes If yes, "What is your reading (oxygen  level) today?" 89  "What is your usual oxygen  saturation reading?"  (Note: Pulmonary O2 sats should be 90% or greater) > 88   1. RESPIRATORY STATUS: "Describe your breathing?" (e.g., wheezing, shortness of breath, unable to speak, severe coughing)      SOB, coughing 2. ONSET: "When did this breathing problem begin?"      today 3. PATTERN "Does the difficult breathing come and go, or has it been constant since it started?"      constant 4. SEVERITY: "How bad is your breathing?" (e.g., mild, moderate, severe)    - MILD: No SOB at rest, mild SOB with walking, speaks normally in sentences, can lie down, no retractions, pulse < 100.    - MODERATE: SOB at rest, SOB with minimal exertion and  prefers to sit, cannot lie down flat, speaks in phrases, mild retractions, audible wheezing, pulse 100-120.    - SEVERE: Very SOB at rest, speaks in single words, struggling to breathe, sitting hunched forward, retractions, pulse > 120      Moderate-severe Triager can appreciate SOB with conversation, speaking in phrases.  6. CARDIAC HISTORY:  "Do you have any history of heart disease?" (e.g., heart attack, angina, bypass surgery, angioplasty)      CHF 7. LUNG HISTORY: "Do you have any history of lung disease?"  (e.g., pulmonary embolus, asthma, emphysema)     Respiratory failure with hypoxia 8. CAUSE: "What do you think is causing the breathing problem?"      Broken neb machine and environmental pollen 9. OTHER SYMPTOMS: "Do you have any other symptoms? (e.g., dizziness, runny nose, cough, chest pain, fever)     cough  Protocols used: Breathing Difficulty-A-AH

## 2024-01-29 NOTE — Addendum Note (Signed)
 Addended by: Kadir Azucena L on: 01/29/2024 05:03 PM   Modules accepted: Orders

## 2024-01-30 ENCOUNTER — Encounter: Payer: Self-pay | Admitting: Hematology and Oncology

## 2024-01-30 ENCOUNTER — Encounter: Payer: Self-pay | Admitting: Adult Health

## 2024-01-30 ENCOUNTER — Other Ambulatory Visit (HOSPITAL_COMMUNITY): Payer: Self-pay

## 2024-01-30 ENCOUNTER — Telehealth (INDEPENDENT_AMBULATORY_CARE_PROVIDER_SITE_OTHER): Admitting: Adult Health

## 2024-01-30 DIAGNOSIS — G4700 Insomnia, unspecified: Secondary | ICD-10-CM | POA: Diagnosis not present

## 2024-01-30 DIAGNOSIS — F411 Generalized anxiety disorder: Secondary | ICD-10-CM

## 2024-01-30 DIAGNOSIS — F410 Panic disorder [episodic paroxysmal anxiety] without agoraphobia: Secondary | ICD-10-CM | POA: Diagnosis not present

## 2024-01-30 DIAGNOSIS — F331 Major depressive disorder, recurrent, moderate: Secondary | ICD-10-CM

## 2024-01-30 NOTE — Progress Notes (Signed)
 [name] [id number] Sep 25, 1971 53 y.o.  Virtual Visit via Video Note  I connected with pt @ on 01/30/24 at 10:30 AM EDT by a video enabled telemedicine application and verified that I am speaking with the correct person using two identifiers.   I discussed the limitations of evaluation and management by telemedicine and the availability of in person appointments. The patient expressed understanding and agreed to proceed.  I discussed the assessment and treatment plan with the patient. The patient was provided an opportunity to ask questions and all were answered. The patient agreed with the plan and demonstrated an understanding of the instructions.   The patient was advised to call back or seek an in-person evaluation if the symptoms worsen or if the condition fails to improve as anticipated.  I provided 30 minutes of non-face-to-face time during this encounter.  The patient was located at home.  The provider was located at [facility].   Reagan Camera, NP   Subjective:   Patient ID:  [name] is a 53 y.o. (DOB 1971-10-01) female.  Chief Complaint: No chief complaint on file.   HPI [name] presents for follow-up of MDD, GAD, insomnia, and panic attacks.  Describes mood today as "about the same". Pleasant. Flat. Tearful. Mood symptoms - reports depression and irritability. Reports increased anxiety. Reports decreased interest and motivation. Reports worsening panic attacks - 2 to 3 times a week. Reports worry, rumination and over thinking. Reports ongoing health concerns- currently in palliative care. Reports mood is variable. Stating "I don't feel like I'm doing any better". Taking medications as prescribed. Working with Reid Capuchin for therapy.  Energy levels lower. Active, does not have a regular exercise routine with current physical disabilities. Is unable to enjoy some usual interests and activities. Married. Lives with husband and son. Has a  daughter - 82 in [place]. Spending time with family.  Appetite adequate. Weight gain 158 to 160 pounds. Reports sleep is variable. Averages 4 hours at night and 6 hours during the day. Reports issues with focus and concentration difficulties. Stating "I'm having memory issues". Completing tasks. Managing aspects of household. Disabled since 2016. Denies SI or HI.  Denies AH or VH. Denies self harm. Denies substance use.   Previous medication trials: Wellbutrin  - mean, Zoloft-not helpful  Review of Systems:  Review of Systems  Musculoskeletal:  Negative for gait problem.  Neurological:  Negative for tremors.  Psychiatric/Behavioral:         Please refer to HPI    Medications: I have reviewed the patient's current medications.  Current Outpatient Medications  Medication Sig Dispense Refill   albuterol  (PROVENTIL ) (2.5 MG/3ML) 0.083% nebulizer solution INHALE 3 ML BY NEBULIZATION EVERY 6 HOURS AS NEEDED FOR WHEEZING OR SHORTNESS OF BREATH 225 mL 1   albuterol  (VENTOLIN  HFA) 108 (90 Base) MCG/ACT inhaler Inhale 2 puffs into the lungs every 6 (six) hours as needed for wheezing or shortness of breath. 8 g 6   ALPRAZolam  (XANAX ) 1 MG tablet 1 tablet daily and 1 tablet PRN no more than 10 days a month. 40 tablet 0   aspirin  EC 81 MG tablet Take 1 tablet (81 mg total) by mouth daily. Swallow whole. (Patient taking differently: Take 81 mg by mouth at bedtime. Chewable) 90 tablet 3   Bismuth Tribromoph-Petrolatum (XEROFORM OCCLUSIVE GAUZE STRIP) PADS Apply 1 each topically as directed. 50 each 1   bisoprolol  (ZEBETA ) 5 MG tablet TAKE 1/2 TABLET BY MOUTH DAILY 7 tablet  0   bumetanide  (BUMEX ) 1 MG tablet TAKE 1 TABLET BY MOUTH TWICE A DAY 30 tablet 0   buPROPion  (WELLBUTRIN  XL) 150 MG 24 hr tablet TAKE 1 TABLET BY MOUTH EVERY DAY 90 tablet 0   clonazePAM  (KLONOPIN ) 1 MG tablet Take 1 tablet (1 mg total) by mouth 2 (two) times daily. 60 tablet 2   cyclobenzaprine  (FLEXERIL ) 5 MG tablet Take 1  tablet (5 mg total) by mouth 3 (three) times daily as needed for muscle spasms. 20 tablet 0   dexlansoprazole  (DEXILANT ) 60 MG capsule Take 1 capsule (60 mg total) by mouth daily. 90 capsule 3   diclofenac  (VOLTAREN ) 50 MG EC tablet Take 50 mg by mouth 2 (two) times daily. *as needed for migraine, Can repeat if needed*     diphenhydrAMINE  (BENADRYL ) 12.5 MG/5ML elixir Take 10 mLs (25 mg total) by mouth every 6 (six) hours as needed (nausea). 120 mL 0   EMGALITY  120 MG/ML SOSY Inject 120 mg into the skin every 28 (twenty-eight) days.     EPINEPHRINE  0.3 mg/0.3 mL IJ SOAJ injection INJECT 0.3 MG INTO THE MUSCLE AS NEEDED FOR ANAPHYLAXIS. 2 each 1   famotidine  (PEPCID ) 40 MG/5ML suspension Take 2.5 mLs (20 mg total) by mouth daily. 75 mL 0   famotidine  (PEPCID ) 40 MG/5ML suspension TAKE 1.3 MLS (10.4 MG TOTAL) BY MOUTH DAILY - DISCARD BOTTLE AFTER 30 DAYS FROM PICKUP 50 mL 0   FLORASTOR 250 MG capsule Take 250 mg by mouth 2 (two) times daily. Mid Morning and Mid Afternoon     fluconazole  (DIFLUCAN ) 200 MG tablet Take 1 tablet (200 mg total) by mouth daily. 2 tabs PO x once, and then one tab daily for another 20 days 22 tablet 0   FLUoxetine  (PROZAC ) 20 MG capsule TAKE 1 CAPSULE BY MOUTH EVERY DAY 90 capsule 0   fluticasone -salmeterol (ADVAIR HFA) 230-21 MCG/ACT inhaler Inhale 2 puffs into the lungs 2 (two) times daily. 1 each 11   Heparin  Na, Pork, Lock Flsh PF (BD HEPARIN  POSIFLUSH) 100 UNIT/ML SOLN USE 5 MLS IN PORT A CATH ONCE DAILY AFTER MEDICATION ADMINISTRATION AS A HEPLOCK AS DIRECTED. 150 mL 3   hydrocortisone  (CORTEF ) 10 MG tablet Take 1-2 tablets (10-20 mg total) by mouth See admin instructions. Take 20 mg in the am and 10mg  in the evening. Take after completing prednisone  (Patient taking differently: Take 10-20 mg by mouth See admin instructions. Take 20 mg in the am at 10 am and 10 mg in the afternoon. Take after completing prednisone )     hydrOXYzine  (ATARAX ) 25 MG tablet Take 1 tablet (25 mg  total) by mouth every 6 (six) hours as needed for anxiety. 30 tablet 0   hyoscyamine  (LEVSIN/SL) 0.125 MG SL tablet Place 1 tablet (0.125 mg total) under the tongue every 6 (six) hours as needed (esophageal spasm). Up to 1.25 mg daily 30 tablet 1   lamoTRIgine  (LAMICTAL ) 200 MG tablet TAKE 1 TABLET BY MOUTH EVERYDAY AT BEDTIME 30 tablet 0   lamoTRIgine  (LAMICTAL ) 25 MG tablet Take two tablets every morning. 180 tablet 0   Lancets (ONETOUCH DELICA PLUS LANCET33G) MISC 3 (three) times daily. for testing     lansoprazole (PREVACID) 15 MG capsule Take 30 mg by mouth in the morning and at bedtime.     lidocaine  (XYLOCAINE ) 2 % solution Use as directed 10 mLs in the mouth or throat every 4 (four) hours as needed (for esophageal pain and pain with swallowing.). 100 mL 1  LORazepam  (ATIVAN ) 0.5 MG tablet Take 1 tablet (0.5 mg total) by mouth 3 (three) times daily as needed for anxiety. (Patient taking differently: Place 0.5 mg into feeding tube 3 (three) times daily as needed for anxiety. Access Port For Nausea) 12 tablet 0   nitroGLYCERIN  (NITROSTAT ) 0.4 MG SL tablet DISSOLVE 1 TAB UNDER THE TONGUE EVERY 5 MINUTES AS NEEDED FOR CHEST PAIN. MAX OF 3 DOSES, THEN 911. 75 tablet 0   NURTEC 75 MG TBDP Take 1 tablet by mouth daily as needed.     OLANZapine  (ZYPREXA ) 5 MG tablet TAKE 1 TABLET BY MOUTH EVERYDAY AT BEDTIME 30 tablet 1   ONETOUCH VERIO test strip 3 (three) times daily. for testing     OXYGEN  Inhale 4-5 L/min into the lungs continuous.     pantoprazole  (PROTONIX ) 40 MG tablet TAKE 1 TABLET BY MOUTH EVERY DAY 90 tablet 1   PROAIR  HFA 108 (90 Base) MCG/ACT inhaler Inhale 2 puffs into the lungs every 4 (four) hours as needed for wheezing or shortness of breath.     QVAR  REDIHALER 80 MCG/ACT inhaler Inhale 1 puff into the lungs 2 (two) times daily.     RABEprazole (ACIPHEX) 20 MG tablet Take 1 tablet (20 mg total) by mouth daily. 30 tablet 0   rosuvastatin  (CRESTOR ) 10 MG tablet Take 10 mg by mouth in  the morning. Mid morning     sacubitril -valsartan  (ENTRESTO ) 24-26 MG TAKE 1 TABLET BY MOUTH TWICE A DAY (Patient taking differently: Take 1 tablet by mouth daily.) 60 tablet 5   Sodium Chloride  Flush (NORMAL SALINE FLUSH) 0.9 % SOLN Use as directed for central venous catheter maintenance 600 mL 3   Sodium Chloride  Flush (NORMAL SALINE FLUSH) 0.9 % SOLN Use as directed for catheter maintenance 600 mL 3   Sodium Chloride  Flush (NORMAL SALINE FLUSH) 0.9 % SOLN Use as directed for catheter maintenance 600 mL 3   sucralfate  (CARAFATE ) 1 GM/10ML suspension Take 1 g by mouth daily as needed (as directed for ulcers).     SYNTHROID  100 MCG tablet Take 1 tablet (100 mcg total) by mouth every morning. (Patient taking differently: Take 100 mcg by mouth every morning. * Must be name brand*)     Tiotropium Bromide  Monohydrate (SPIRIVA  RESPIMAT) 1.25 MCG/ACT AERS Inhale 2 puffs into the lungs daily. 4 g 11   topiramate  (TOPAMAX ) 50 MG tablet Take 150 mg by mouth at bedtime.     zolpidem  (AMBIEN  CR) 12.5 MG CR tablet TAKE ONE TABLET BY MOUTH AT BEDTIME AS NEEDED FOR SLEEP. 90 tablet 0   zonisamide (ZONEGRAN) 100 MG capsule Take 100 mg by mouth daily.     No current facility-administered medications for this visit.    Medication Side Effects: None  Allergies:  Allergies  Allergen Reactions   Ferrous Bisglycinate Chelate [Iron] Anaphylaxis and Other (See Comments)    Only IV - only FERRLICET   Mushroom Extract Complex (Obsolete) Anaphylaxis   Na Ferric Gluc Cplx In Sucrose Anaphylaxis   Cymbalta [Duloxetine Hcl] Swelling and Anxiety   Hydromorphone  Other (See Comments)    BP drop and heart rate drops 5.1.20 PT REPORTS THAT SHE TAKES DILAUDID  AT HOME   Ondansetron  Hcl Other (See Comments)   Promethazine Other (See Comments)    Other reaction(s): Unknown   Succinylcholine Other (See Comments)    Lock Jaw   Buprenorphine Hcl Hives   Compazine Other (See Comments)    Altered mental status Aggression    Duloxetine  Other reaction(s): Not available   Metoclopramide Other (See Comments)    Dystonia   Morphine And Codeine Hives   Ondansetron  Hives and Rash    Other reaction(s): Unknown Other reaction(s): Unknown   Promethazine Hcl Hives   Tegaderm Ag Mesh [Silver ] Rash    Old formulation only, is able to tolerate new formulation    Past Medical History:  Diagnosis Date   Addison's disease (HCC)    Adrenal insufficiency (HCC)    Anemia    Anxiety    Aortic stenosis    Aortic stenosis    Appendicitis 12/19/2009   Appendicitis    Breast cancer (HCC)    STATUS POST BILATERAL MASTECTOMY. STATUS POST RECONSTRUCTION. SHE HAD SILICONE BREAST IMPLANTS AND THE LEFT IMPLANT IS LEAKING SLIGHTLY   Cellulitis of right middle finger 11/07/2018   Cervical cancer (HCC) 12/23/2018   Chest pain    CHF with right heart failure (HCC) 04/17/2017   Chronic respiratory failure with hypoxia (HCC) 12/23/2018   CKD (chronic kidney disease) stage 3, GFR 30-59 ml/min (HCC)    Cough variant asthma 04/13/2019   Depression    Functional neurological symptom disorder with mixed symptoms 2023   GERD (gastroesophageal reflux disease)    takes Dexilant  and carafate  and gi coctail    Headache    migraines on a daily and monthly regimen    Heart murmur    History of kidney stones    Hodgkin lymphoma (HCC)    STATUS POST MANTLE RADIATION   Hodgkin's lymphoma (HCC)    1987   Hypertension    Hypoxia    Multiple lung nodules    bilateral   Necrotizing fasciitis (HCC) 12/23/2018   Non-ischemic cardiomyopathy (HCC)    Osteoporosis    Ovarian tumor (benign)    bilateral   Palpitations    Pituitary adenoma (HCC) 12/23/2018   Pneumonia    PONV (postoperative nausea and vomiting)    Pre-diabetes    per pt; no meds   Pulmonary hypertension (HCC) 12/23/2018   Raynaud phenomenon    Right heart failure (HCC) 04/17/2017   Seizures (HCC)    last febrile seizure was approx 3 weeks ago per report on  12/01/2020   Supplemental oxygen  dependent    3 liters   SVT (supraventricular tachycardia) (HCC)    Tachycardia    Thyroid  cancer (HCC)    STATUS POST SURGICAL REMOVAL-CURRENT ON THYROID  REPLACEMENT    Family History  Adopted: Yes  Family history unknown: Yes    Social History   Socioeconomic History   Marital status: Married    Spouse name: Not on file   Number of children: Not on file   Years of education: Not on file   Highest education level: Not on file  Occupational History   Not on file  Tobacco Use   Smoking status: Never   Smokeless tobacco: Never  Vaping Use   Vaping status: Never Used  Substance and Sexual Activity   Alcohol use: Not Currently    Comment: social    Drug use: No   Sexual activity: Not Currently    Birth control/protection: Surgical  Other Topics Concern   Not on file  Social History Narrative   Not on file   Social Drivers of Health   Financial Resource Strain: Medium Risk (08/21/2017)   Received from [facility], [facility]   Overall Financial Resource Strain (CARDIA)    Difficulty of Paying Living Expenses: Somewhat hard  Food Insecurity: No Food Insecurity (04/23/2023)   Hunger Vital Sign    Worried About Running Out of Food in the Last Year: Never true    Ran Out of Food in the Last Year: Never true  Transportation Needs: No Transportation Needs (04/23/2023)   PRAPARE - Administrator, Civil Service (Medical): No    Lack of Transportation (Non-Medical): No  Physical Activity: Unknown (08/21/2017)   Received from [facility], [facility]   Exercise Vital Sign    Days of Exercise per Week: 0 days    Minutes of Exercise per Session: Not on file  Stress: Stress Concern Present (08/21/2017)   Received from [facility], [facility]   [employer] of Occupational Health - Occupational Stress Questionnaire     Feeling of Stress : Very much  Social Connections: Unknown (02/12/2022)   Received from [facility], [facility]   Social Network    Social Network: Not on file  Intimate Partner Violence: Not At Risk (04/23/2023)   Humiliation, Afraid, Rape, and Kick questionnaire    Fear of Current or Ex-Partner: No    Emotionally Abused: No    Physically Abused: No    Sexually Abused: No    Past Medical History, Surgical history, Social history, and Family history were reviewed and updated as appropriate.   Please see review of systems for further details on the patient's review from today.   Objective:   Physical Exam:  LMP  (LMP Unknown)   Physical Exam Constitutional:      General: She is not in acute distress. Musculoskeletal:        General: No deformity.  Neurological:     Mental Status: She is alert and oriented to person, place, and time.     Coordination: Coordination normal.  Psychiatric:        Attention and Perception: Attention and perception normal. She does not perceive auditory or visual hallucinations.        Mood and Affect: Mood is anxious and depressed. Affect is not labile, blunt, angry or inappropriate.        Speech: Speech normal.        Behavior: Behavior normal.        Thought Content: Thought content normal. Thought content is not paranoid or delusional. Thought content does not include homicidal or suicidal ideation. Thought content does not include homicidal or suicidal plan.        Cognition and Memory: Cognition and memory normal.        Judgment: Judgment normal.     Comments: Insight intact     Lab Review:     Component Value Date/Time   NA 141 12/07/2023 1259   NA 140 08/20/2022 1513   K 3.6 12/07/2023 1259   CL 104 12/07/2023 1259   CO2 28 12/07/2023 1259   GLUCOSE 79 12/07/2023 1259   BUN 28 (H) 12/07/2023 1259   BUN 28 (H) 08/20/2022 1513   CREATININE 1.31 (H) 12/07/2023 1259   CREATININE 1.08 (H) 02/09/2022 1202   CALCIUM  9.1 12/07/2023  1259   PROT 6.8 09/03/2023 1315   PROT 7.1 01/23/2018 1225   ALBUMIN 3.8 09/03/2023 1315   ALBUMIN 4.4 01/23/2018 1225   AST 18 09/03/2023 1315   AST 26 02/09/2022 1202   ALT 25 09/03/2023 1315   ALT 40 02/09/2022 1202   ALKPHOS 56 09/03/2023 1315   BILITOT 0.3 09/03/2023 1315   BILITOT 0.4  02/09/2022 1202   GFRNONAA 49 (L) 12/07/2023 1259   GFRNONAA >60 02/09/2022 1202   GFRAA >60 04/27/2020 0613   GFRAA >60 02/10/2020 1020       Component Value Date/Time   WBC 9.6 12/07/2023 1259   RBC 3.94 12/07/2023 1259   HGB 9.6 (L) 12/07/2023 1259   HGB 10.6 (L) 08/20/2022 1513   HCT 31.2 (L) 12/07/2023 1259   HCT 34.4 08/20/2022 1513   PLT 370 12/07/2023 1259   PLT 414 08/20/2022 1513   MCV 79.2 (L) 12/07/2023 1259   MCV 78 (L) 08/20/2022 1513   MCH 24.4 (L) 12/07/2023 1259   MCHC 30.8 12/07/2023 1259   RDW 15.9 (H) 12/07/2023 1259   RDW 16.5 (H) 08/20/2022 1513   LYMPHSABS 2.2 09/03/2023 1315   MONOABS 1.0 09/03/2023 1315   EOSABS 0.1 09/03/2023 1315   BASOSABS 0.1 09/03/2023 1315    No results found for: "POCLITH", "LITHIUM"   No results found for: "PHENYTOIN", "PHENOBARB", "VALPROATE", "CBMZ"   .res Assessment: Plan:    Plan:  PDMP reviewed  Clonazepam  1mg  - twice daily. Xanax  1mg  daily and one extra as needed for extreme anxiety up to10 days a month.  Prozac  20mg  daily  Wellbutrin  XL150mg  every morning - did not tolerate increase in dose - reports seizure history  Lamictal  200mg  daily Lamictal  50mg  every morning  Olanzapine  5mg  at hs   Seeing Reid Capuchin therapy visits.  RTC 4 weeks  30 minutes spent dedicated to the care of this patient on the date of this encounter to include pre-visit review of records, ordering of medication, post visit documentation, and face-to-face time with the patient discussing MDD, GAD, insomnia, and panic attacks. Discussed continuing current medication regimen.  Discussed potential benefits, risk, and side effects of  benzodiazepines to include potential risk of tolerance and dependence, as well as possible drowsiness.  Advised patient not to drive if experiencing drowsiness and to take lowest possible effective dose to minimize risk of dependence and tolerance.   Counseled patient regarding potential benefits, risks, and side effects of Lamictal  to include potential risk of Stevens-Johnson syndrome. Advised patient to stop taking Lamictal  and contact office immediately if rash develops and to seek urgent medical attention if rash is severe and/or spreading quickly.  Discussed potential metabolic side effects associated with atypical antipsychotics, as well as potential risk for movement side effects. Advised pt to contact office if movement side effects occur.    Patient advised to contact office with any questions, adverse effects, or acute worsening in signs and symptoms.  There are no diagnoses linked to this encounter.   Please see After Visit Summary for patient specific instructions.  Future Appointments  Date Time Provider Department Center  01/30/2024 10:30 AM Athanasia Stanwood Nattalie, NP [facility] None  02/04/2024  1:00 PM Kelleen Patee, LCSW [facility] None    No orders of the defined types were placed in this encounter.     -------------------------------

## 2024-01-30 NOTE — Telephone Encounter (Signed)
 FYI

## 2024-02-01 DIAGNOSIS — G8222 Paraplegia, incomplete: Secondary | ICD-10-CM | POA: Diagnosis not present

## 2024-02-01 DIAGNOSIS — R911 Solitary pulmonary nodule: Secondary | ICD-10-CM | POA: Diagnosis not present

## 2024-02-04 ENCOUNTER — Ambulatory Visit: Admitting: Psychiatry

## 2024-02-04 DIAGNOSIS — F331 Major depressive disorder, recurrent, moderate: Secondary | ICD-10-CM | POA: Diagnosis not present

## 2024-02-04 NOTE — Progress Notes (Signed)
 [facility] Counselor/Therapist Progress Note  Patient ID: [name], MRN: [id number],    Date: 02/04/2024  Time Spent: 50 minutes   Treatment Type: Individual Therapy  Virtual Visit via Telehealth Note: MyChart Video session Connected with patient by a telemedicine/telehealth application, with their informed consent, and verified patient privacy and that I am speaking with the correct person using two identifiers. I discussed the limitations, risks, security and privacy concerns of performing psychotherapy and the availability of in person appointments. I also discussed with the patient that there may be a patient responsible charge related to this service. The patient expressed understanding and agreed to proceed. I discussed the treatment planning with the patient. The patient was provided an opportunity to ask questions and all were answered. The patient agreed with the plan and demonstrated an understanding of the instructions. The patient was advised to call  our office if  symptoms worsen or feel they are in a crisis state and need immediate contact.   Therapist Location: office Patient Location: home   Reported Symptoms: anxiety, depression, panic, fear "but it's not rational", frustrations with a lot of medical staff   Mental Status Exam:  Appearance:   Casual     Behavior:  Appropriate and Motivated  Motor:  Afffected by her cancer  Speech/Language:   Clear and Coherent  Affect:  Depressed and irritable, anxious  Mood:  anxious, depressed, and irritable  Thought process:  goal directed  Thought content:    Rumination  Sensory/Perceptual disturbances:    WNL  Orientation:  oriented to person, place, time/date, situation, day of week, month of year, year, and stated date of Feb 04, 2024  Attention:  Fair  Concentration:  Good and Fair  Memory:  "Some days are ok and some are not too good"  Fund of knowledge:   Good  Insight:    Good and Fair  Judgment:   Good   Impulse Control:  Good and Fair   Risk Assessment: Danger to Self:  No Self-injurious Behavior: No Danger to Others: No Duty to Warn:no Physical Aggression / Violence:No  Access to Firearms a concern: No  Gang Involvement:No   Subjective:  Patient today reports depression, anxiety, panic/fear "at times", and states "lot of frustration with medical people". Talked through several situations that have been stressful for patient and she seemed to be calmer later in session. Is frustrated with God and "I talk to Him every morning". States that she's had some issues with medical staff and that she is no longer being treated by and that her doctor now is not a hospice nor palliative care doctor, but is a "regular doctor" in [place]. Denies being as stuck in her fear nor tears today but ended up becoming more tearful before end of session. Encouraged her to vent her feelings freely which patient did and seemed to feel supported but also less "frustrated with all my feelings."  Seemed to be doing better physically today in session as it was less of a struggle and talking.  Seemed less anxious and depressed by end of session.  Is rescheduling again within a couple of weeks for another telehealth MyChart video session.  This is not all details included in this note due to patient privacy needs.)   Interventions: Cognitive Behavioral Therapy, Solution-Oriented/Positive Psychology, Ego-Supportive, and Grief Therapy Long-term goal:  Patient will confront her anxiety, fears, anger reported re: her illness and use time in therapy and beyond to work on this,  while receiving feedback and support from therapist. Help her determine what she feels is realistic for her considering her health challenges and difficult prognosis.  Short-term goal: Participate in therapy and initiate communication of needs and desires at this point in her struggle physically.  Strategies: Share and talk through anxiety, anger, and  depression that are experienced and work to find and use helpful coping tools to better manage those feelings.    Diagnosis:   ICD-10-CM   1. Major depressive disorder, recurrent episode, moderate (HCC)  F33.1      Plan: Patient actively participated in session today including talking through anxiety, depression, fears, and family issues.  She was very tearful at the beginning of session and again later in the session, which actually seemed helpful to her and venting not only her thoughts and feelings but also the emotional expression of the tears.  She was calm at the end of session and seemed to be feeling more stable and looking forward to more family plans in the near future.  Very actively participated in session and able to express her thoughts and feelings openly.  Odile "[name]" Anania has made progress and needs to continue her work with goal-directed behaviors so as to keep moving in a forward direction, trying to manage the challenges of her health condition and her overall emotional health.  Goal review and progress/challenges noted with patient.  Next appointment within 1-2 weeks.   Kelleen Patee, LCSW

## 2024-02-05 ENCOUNTER — Encounter (HOSPITAL_COMMUNITY): Payer: Self-pay | Admitting: Gastroenterology

## 2024-02-05 NOTE — Progress Notes (Signed)
 [name]  PCP- Toppin MD  Cardiologist-Nahser MD PulmonologistWashington Hacker MD  Chest xray- 12/22/23 EKG-12/24/23 Echo-04/25/23 Cath-08/31/22 Stress-n/a ICD/PM-n/a Blood thinner-n/a GLP-1-n/a   Hx- Aortic Stenosis, CHF, Murmur, HTN, SVT, Asthma, CKD3, Addisons, Adrenal Insufficiency, Cancer- Breast, Thyroid , Hodgkin's, Respiratory Failure, Seizures, Pre DM. Patient last saw her cardiologist in 2023, says she's got an appt tmrw to adjust her fluid medication, was on Bumex , now not as effective, not feeling symptomatic of fluid overload. Last saw pulm. 4/9, currently weaned herself from 5L02 to 3L02, no new breathing issues. Did call into triage 4/30 for SOB but was related to not having equipment, issues resolved. Usually uses a w/c, can use walker if not far distances. Had last EGD 12/26/23, reports no issues from that.  Anesthesia Review- Yes- okay for procedure

## 2024-02-06 ENCOUNTER — Telehealth: Payer: Self-pay

## 2024-02-06 NOTE — Telephone Encounter (Signed)
 MyChart message sent to patient asking that she call office or respond to MyChart message to confirm upcoming procedure.

## 2024-02-06 NOTE — Telephone Encounter (Signed)
 Procedure:EGD Procedure date: 02/11/24 Procedure location: [facility] Arrival Time: 8:45 Spoke with the patient Y/N: N Any prep concerns? N  Has the patient obtained the prep from the pharmacy ? N Do you have a care partner and transportation: N Any additional concerns? N   I called the patient twice no answer I then called the patient a third time and left a detailed message about the procedure and stated if she had questions are concerns she can give the office a call.

## 2024-02-07 ENCOUNTER — Other Ambulatory Visit: Payer: Self-pay

## 2024-02-07 ENCOUNTER — Other Ambulatory Visit (HOSPITAL_COMMUNITY): Payer: Self-pay

## 2024-02-07 MED ORDER — SODIUM CHLORIDE FLUSH 0.9 % IV SOLN
INTRAVENOUS | 3 refills | Status: DC
Start: 2024-02-07 — End: 2024-06-28
  Filled 2024-02-07: qty 300, 30d supply, fill #0
  Filled 2024-02-08: qty 600, 30d supply, fill #0
  Filled 2024-05-15 – 2024-05-27 (×3): qty 600, 30d supply, fill #1

## 2024-02-08 ENCOUNTER — Encounter: Payer: Self-pay | Admitting: Hematology and Oncology

## 2024-02-08 ENCOUNTER — Other Ambulatory Visit (HOSPITAL_COMMUNITY): Payer: Self-pay

## 2024-02-10 ENCOUNTER — Other Ambulatory Visit (HOSPITAL_COMMUNITY): Payer: Self-pay

## 2024-02-10 MED ORDER — NORMAL SALINE FLUSH 0.9 % IV SOLN
INTRAVENOUS | 3 refills | Status: DC
  Filled 2024-03-08: qty 600, 30d supply, fill #0
  Filled 2024-03-11: qty 600, 5d supply, fill #0
  Filled 2024-04-01 (×3): qty 600, 5d supply, fill #1
  Filled 2024-04-23: qty 600, 5d supply, fill #2
  Filled 2024-05-05: qty 600, 5d supply, fill #3

## 2024-02-10 MED ORDER — NORMAL SALINE FLUSH 0.9 % IV SOLN
10.0000 mL | Freq: Every day | INTRAVENOUS | 3 refills | Status: DC
Start: 2024-02-10 — End: 2024-05-29
  Filled 2024-02-10 – 2024-02-13 (×2): qty 600, 30d supply, fill #0
  Filled 2024-02-28: qty 600, 30d supply, fill #1
  Filled 2024-04-14: qty 600, 30d supply, fill #2
  Filled 2024-05-15: qty 600, 30d supply, fill #3

## 2024-02-11 ENCOUNTER — Encounter (HOSPITAL_COMMUNITY)
Admission: RE | Disposition: A | Discharge status: Home / Self Care | Payer: Self-pay | Source: Home / Self Care | Attending: Gastroenterology

## 2024-02-11 ENCOUNTER — Ambulatory Visit (HOSPITAL_COMMUNITY): Admitting: Anesthesiology

## 2024-02-11 ENCOUNTER — Ambulatory Visit (HOSPITAL_COMMUNITY)
Admission: RE | Admit: 2024-02-11 | Discharge: 2024-02-11 | Disposition: A | Discharge status: Home / Self Care | Attending: Gastroenterology | Admitting: Gastroenterology

## 2024-02-11 ENCOUNTER — Other Ambulatory Visit: Payer: Self-pay

## 2024-02-11 ENCOUNTER — Encounter (HOSPITAL_COMMUNITY): Payer: Self-pay | Admitting: Gastroenterology

## 2024-02-11 DIAGNOSIS — K449 Diaphragmatic hernia without obstruction or gangrene: Secondary | ICD-10-CM | POA: Insufficient documentation

## 2024-02-11 DIAGNOSIS — Z9221 Personal history of antineoplastic chemotherapy: Secondary | ICD-10-CM | POA: Insufficient documentation

## 2024-02-11 DIAGNOSIS — K219 Gastro-esophageal reflux disease without esophagitis: Secondary | ICD-10-CM

## 2024-02-11 DIAGNOSIS — K317 Polyp of stomach and duodenum: Secondary | ICD-10-CM | POA: Insufficient documentation

## 2024-02-11 DIAGNOSIS — I350 Nonrheumatic aortic (valve) stenosis: Secondary | ICD-10-CM | POA: Diagnosis not present

## 2024-02-11 DIAGNOSIS — E2740 Unspecified adrenocortical insufficiency: Secondary | ICD-10-CM | POA: Insufficient documentation

## 2024-02-11 DIAGNOSIS — F32A Depression, unspecified: Secondary | ICD-10-CM | POA: Diagnosis not present

## 2024-02-11 DIAGNOSIS — I50810 Right heart failure, unspecified: Secondary | ICD-10-CM | POA: Diagnosis not present

## 2024-02-11 DIAGNOSIS — E271 Primary adrenocortical insufficiency: Secondary | ICD-10-CM | POA: Insufficient documentation

## 2024-02-11 DIAGNOSIS — Z7982 Long term (current) use of aspirin: Secondary | ICD-10-CM | POA: Insufficient documentation

## 2024-02-11 DIAGNOSIS — B3781 Candidal esophagitis: Secondary | ICD-10-CM | POA: Diagnosis not present

## 2024-02-11 DIAGNOSIS — K222 Esophageal obstruction: Secondary | ICD-10-CM | POA: Diagnosis not present

## 2024-02-11 DIAGNOSIS — R1310 Dysphagia, unspecified: Secondary | ICD-10-CM | POA: Insufficient documentation

## 2024-02-11 DIAGNOSIS — E039 Hypothyroidism, unspecified: Secondary | ICD-10-CM | POA: Diagnosis not present

## 2024-02-11 DIAGNOSIS — R569 Unspecified convulsions: Secondary | ICD-10-CM | POA: Diagnosis not present

## 2024-02-11 DIAGNOSIS — Z7952 Long term (current) use of systemic steroids: Secondary | ICD-10-CM | POA: Insufficient documentation

## 2024-02-11 DIAGNOSIS — Z79899 Other long term (current) drug therapy: Secondary | ICD-10-CM | POA: Insufficient documentation

## 2024-02-11 DIAGNOSIS — Z9981 Dependence on supplemental oxygen: Secondary | ICD-10-CM | POA: Insufficient documentation

## 2024-02-11 DIAGNOSIS — Z7951 Long term (current) use of inhaled steroids: Secondary | ICD-10-CM | POA: Insufficient documentation

## 2024-02-11 DIAGNOSIS — F419 Anxiety disorder, unspecified: Secondary | ICD-10-CM | POA: Insufficient documentation

## 2024-02-11 DIAGNOSIS — N183 Chronic kidney disease, stage 3 unspecified: Secondary | ICD-10-CM | POA: Diagnosis not present

## 2024-02-11 DIAGNOSIS — R519 Headache, unspecified: Secondary | ICD-10-CM | POA: Diagnosis not present

## 2024-02-11 DIAGNOSIS — Z8541 Personal history of malignant neoplasm of cervix uteri: Secondary | ICD-10-CM | POA: Diagnosis not present

## 2024-02-11 DIAGNOSIS — Z85850 Personal history of malignant neoplasm of thyroid: Secondary | ICD-10-CM | POA: Insufficient documentation

## 2024-02-11 DIAGNOSIS — K2100 Gastro-esophageal reflux disease with esophagitis, without bleeding: Secondary | ICD-10-CM | POA: Diagnosis not present

## 2024-02-11 DIAGNOSIS — I130 Hypertensive heart and chronic kidney disease with heart failure and stage 1 through stage 4 chronic kidney disease, or unspecified chronic kidney disease: Secondary | ICD-10-CM | POA: Insufficient documentation

## 2024-02-11 DIAGNOSIS — Z853 Personal history of malignant neoplasm of breast: Secondary | ICD-10-CM | POA: Diagnosis not present

## 2024-02-11 DIAGNOSIS — I2729 Other secondary pulmonary hypertension: Secondary | ICD-10-CM | POA: Diagnosis not present

## 2024-02-11 DIAGNOSIS — J45909 Unspecified asthma, uncomplicated: Secondary | ICD-10-CM | POA: Insufficient documentation

## 2024-02-11 HISTORY — PX: ESOPHAGEAL DILATION: SHX303

## 2024-02-11 HISTORY — PX: ESOPHAGOGASTRODUODENOSCOPY: SHX5428

## 2024-02-11 SURGERY — EGD (ESOPHAGOGASTRODUODENOSCOPY)
Anesthesia: Monitor Anesthesia Care

## 2024-02-11 MED ORDER — DEXMEDETOMIDINE HCL IN NACL 200 MCG/50ML IV SOLN
INTRAVENOUS | Status: DC | PRN
  Administered 2024-02-11 (×2): 6 ug via INTRAVENOUS

## 2024-02-11 MED ORDER — MIDAZOLAM HCL 2 MG/2ML IJ SOLN
INTRAMUSCULAR | Status: AC
  Filled 2024-02-11: qty 2

## 2024-02-11 MED ORDER — PROPOFOL 500 MG/50ML IV EMUL
INTRAVENOUS | Status: DC | PRN
  Administered 2024-02-11: 20 mg via INTRAVENOUS
  Administered 2024-02-11: 40 mg via INTRAVENOUS
  Administered 2024-02-11: 30 mg via INTRAVENOUS
  Administered 2024-02-11: 40 mg via INTRAVENOUS
  Administered 2024-02-11: 100 ug/kg/min via INTRAVENOUS

## 2024-02-11 MED ORDER — SODIUM CHLORIDE 0.9 % IV SOLN: INTRAVENOUS | Status: DC

## 2024-02-11 MED ORDER — MIDAZOLAM HCL 5 MG/5ML IJ SOLN
INTRAMUSCULAR | Status: DC | PRN
  Administered 2024-02-11: 2 mg via INTRAVENOUS

## 2024-02-11 MED ORDER — HYDROCORTISONE SOD SUC (PF) 100 MG IJ SOLR
100.0000 mg | Freq: Once | INTRAMUSCULAR | Status: AC
Start: 2024-02-11 — End: 2024-02-11
  Administered 2024-02-11: 100 mg via INTRAVENOUS
  Filled 2024-02-11: qty 2

## 2024-02-11 NOTE — H&P (Signed)
 GASTROENTEROLOGY PROCEDURE H&P NOTE   Primary Care Physician: Toppin, Jean M, MD    Reason for Procedure:  Odynophagia, dysphagia, Candida esophagitis, GERD, esophageal stricture  Plan:    EGD with possible dilation and/or biopsy   Patient is appropriate for endoscopic procedure(s) at [facility].  The nature of the procedure, as well as the risks, benefits, and alternatives were carefully and thoroughly reviewed with the patient. Ample time for discussion and questions allowed. The patient understood, was satisfied, and agreed to proceed.     HPI: [name] is a 53 y.o. female who presents for EGD for reevaluation along with possible biopsy and/or dilation.  Underwent EGD on 12/26/2023 which was notable for 2 cm hiatal hernia, severe Candida esophagitis (treated with fluconazole  x 3 weeks), stricture in the proximal esophagus which was not dilated due to severity of Candida, benign fundic gland polyps, and a pyloric channel AVM treated with APC.  Presents today for reevaluation after having completed fluconazole .  She reports overall clinical improvement with the fluconazole , but symptoms have slowly started to recur over the last week or so.  Completed the fluconazole  as prescribed.  For her reflux, currently taking Dexilant  60 mg daily and Pepcid  40 mg in the evening, but still with regular breakthrough reflux symptoms.  Continues to have proximal dysphagia and food regurgitation.  Due to chronic supplemental O2 use, procedure scheduled at [facility] due to elevated periprocedural risks.  Past Medical History:  Diagnosis Date   Addison's disease (HCC)    Adrenal insufficiency (HCC)    Anemia    Anxiety    Aortic stenosis    Aortic stenosis    Appendicitis 12/19/2009   Appendicitis    Breast cancer (HCC)    STATUS POST BILATERAL MASTECTOMY. STATUS POST RECONSTRUCTION. SHE HAD SILICONE BREAST IMPLANTS AND THE LEFT IMPLANT IS  LEAKING SLIGHTLY   Cellulitis of right middle finger 11/07/2018   Cervical cancer (HCC) 12/23/2018   Chest pain    CHF with right heart failure (HCC) 04/17/2017   Chronic respiratory failure with hypoxia (HCC) 12/23/2018   CKD (chronic kidney disease) stage 3, GFR 30-59 ml/min (HCC)    Cough variant asthma 04/13/2019   Depression    Functional neurological symptom disorder with mixed symptoms 2023   GERD (gastroesophageal reflux disease)    takes Dexilant  and carafate  and gi coctail    Headache    migraines on a daily and monthly regimen    Heart murmur    History of kidney stones    Hodgkin lymphoma (HCC)    STATUS POST MANTLE RADIATION   Hodgkin's lymphoma (HCC)    1987   Hypertension    Hypoxia    Multiple lung nodules    bilateral   Necrotizing fasciitis (HCC) 12/23/2018   Non-ischemic cardiomyopathy (HCC)    Osteoporosis    Ovarian tumor (benign)    bilateral   Palpitations    Pituitary adenoma (HCC) 12/23/2018   Pneumonia    PONV (postoperative nausea and vomiting)    Pre-diabetes    per pt; no meds   Pulmonary hypertension (HCC) 12/23/2018   Raynaud phenomenon    Right heart failure (HCC) 04/17/2017   Seizures (HCC)    last febrile seizure was approx 3 weeks ago per report on 12/01/2020   Supplemental oxygen  dependent    3 liters   SVT (supraventricular tachycardia) (HCC)    Tachycardia    Thyroid  cancer (HCC)  STATUS POST SURGICAL REMOVAL-CURRENT ON THYROID  REPLACEMENT    Past Surgical History:  Procedure Laterality Date   ABDOMINAL HYSTERECTOMY     AMPUTATION Left 01/30/2019   Procedure: Left Index finger amputation with flap reconstruction and repair reconstruction;  Surgeon: Ronn Cohn, MD;  Location: [facility] OR;  Service: Orthopedics;  Laterality: Left;   APPENDECTOMY     BIOPSY OF SKIN SUBCUTANEOUS TISSUE AND/OR MUCOUS MEMBRANE  12/26/2023   Procedure: BIOPSY, SKIN, SUBCUTANEOUS TISSUE, OR MUCOUS MEMBRANE;  Surgeon: Ace Holder, MD;   Location: [facility];  Service: Gastroenterology;;   breast implants and removal      breast implants but leaking      CARDIAC CATHETERIZATION  05/18/2009   NORMAL CATH   CESAREAN SECTION  2007   and 1997   COLONOSCOPY     ESOPHAGOGASTRODUODENOSCOPY N/A 12/26/2023   Procedure: EGD (ESOPHAGOGASTRODUODENOSCOPY);  Surgeon: Ace Holder, MD;  Location: [facility];  Service: Gastroenterology;  Laterality: N/A;   HOT HEMOSTASIS N/A 12/26/2023   Procedure: EGD, WITH ARGON PLASMA COAGULATION;  Surgeon: Ace Holder, MD;  Location: [facility];  Service: Gastroenterology;  Laterality: N/A;   hx of chemotherapy      hx of radiation therapy      I & D EXTREMITY Left 12/23/2018   Procedure: IRRIGATION AND DEBRIDEMENT HAND / INDEX FINGER;  Surgeon: Ronn Cohn, MD;  Location: [facility] OR;  Service: Orthopedics;  Laterality: Left;   IR CV LINE INJECTION  03/22/2022   IR FLUORO GUIDE CV LINE RIGHT  10/09/2022   IR FLUORO GUIDE CV LINE RIGHT  01/18/2023   IR FLUORO GUIDE CV LINE RIGHT  03/26/2023   IR FLUORO GUIDE CV LINE RIGHT  10/10/2023   IR FLUORO GUIDE CV LINE RIGHT  01/06/2024   IR IMAGING GUIDED PORT INSERTION  05/06/2020   IR IMAGING GUIDED PORT INSERTION  12/04/2021   IR REMOVAL TUN ACCESS W/ PORT W/O FL MOD SED  04/27/2020   IR REMOVAL TUN ACCESS W/ PORT W/O FL MOD SED  12/04/2021   IR REMOVAL TUN ACCESS W/ PORT W/O FL MOD SED  03/30/2022   IR US  GUIDE VASC ACCESS RIGHT  10/09/2022   KIDNEY STONE SURGERY     LUMBAR PUNCTURE W/ INTRATHECAL CHEMOTHERAPY     MASTECTOMY     PITUITARY SURGERY     RIGHT/LEFT HEART CATH AND CORONARY ANGIOGRAPHY N/A 04/02/2018   Procedure: RIGHT/LEFT HEART CATH AND CORONARY ANGIOGRAPHY;  Surgeon: Odie Benne, MD;  Location: [facility];  Service: Cardiovascular;  Laterality: N/A;   RIGHT/LEFT HEART CATH AND CORONARY ANGIOGRAPHY N/A 08/31/2022   Procedure: RIGHT/LEFT HEART CATH AND CORONARY ANGIOGRAPHY;  Surgeon: Mardell Shade, MD;  Location: [facility];  Service: Cardiovascular;  Laterality: N/A;   TOOTH EXTRACTION N/A 12/05/2020   Procedure: DENTAL RESTORATION/EXTRACTIONS;  Surgeon: Auburn Leak, MD;  Location: [facility];  Service: Oral Surgery;  Laterality: N/A;   TOTAL THYROIDECTOMY     VIDEO BRONCHOSCOPY Bilateral 11/14/2018   Procedure: VIDEO BRONCHOSCOPY WITHOUT FLUORO;  Surgeon: Quillian Brunt, MD;  Location: [facility] END[facility];  Service: Cardiopulmonary;  Laterality: Bilateral;   VIDEO BRONCHOSCOPY WITH ENDOBRONCHIAL ULTRASOUND N/A 11/19/2018   Procedure: VIDEO BRONCHOSCOPY WITH ENDOBRONCHIAL ULTRASOUND;  Surgeon: Quillian Brunt, MD;  Location: [facility] OR;  Service: Thoracic;  Laterality: N/A;    Prior to Admission medications   Medication Sig Start Date End Date Taking? Authorizing Provider  albuterol  (PROVENTIL ) (2.5 MG/3ML) 0.083% nebulizer solution INHALE 3  ML BY NEBULIZATION EVERY 6 HOURS AS NEEDED FOR WHEEZING OR SHORTNESS OF BREATH 12/13/23   Quillian Brunt, MD  albuterol  (VENTOLIN  HFA) 108 [phone] Base) MCG/ACT inhaler Inhale 2 puffs into the lungs every 6 (six) hours as needed for wheezing or shortness of breath. 01/08/24   Quillian Brunt, MD  ALPRAZolam  (XANAX ) 1 MG tablet 1 tablet daily and 1 tablet PRN no more than 10 days a month. 01/28/24   Mozingo, Regina Nattalie, NP  aspirin  EC 81 MG tablet Take 1 tablet (81 mg total) by mouth daily. Swallow whole. Patient taking differently: Take 81 mg by mouth at bedtime. Chewable 07/16/22   Nahser, Lela Purple, MD  Bismuth Tribromoph-Petrolatum (XEROFORM OCCLUSIVE GAUZE STRIP) PADS Apply 1 each topically as directed. 09/17/22   Kraig Peru, MD  bisoprolol  (ZEBETA ) 5 MG tablet TAKE 1/2 TABLET BY MOUTH DAILY 01/29/24   Nahser, Lela Purple, MD  bumetanide  (BUMEX ) 1 MG tablet TAKE 1 TABLET BY MOUTH TWICE A DAY 01/29/24   Nahser, Lela Purple, MD  buPROPion  (WELLBUTRIN  XL) 150 MG 24 hr tablet TAKE 1 TABLET BY MOUTH EVERY DAY 01/28/24   Mozingo, Regina  Nattalie, NP  clonazePAM  (KLONOPIN ) 1 MG tablet Take 1 tablet (1 mg total) by mouth 2 (two) times daily. 11/04/23   Mozingo, Regina Nattalie, NP  cyclobenzaprine  (FLEXERIL ) 5 MG tablet Take 1 tablet (5 mg total) by mouth 3 (three) times daily as needed for muscle spasms. 06/20/23   Leath-Warren, Belen Bowers, NP  dexlansoprazole  (DEXILANT ) 60 MG capsule Take 1 capsule (60 mg total) by mouth daily. 12/19/23   May, Deanna J, NP  diclofenac  (VOLTAREN ) 50 MG EC tablet Take 50 mg by mouth 2 (two) times daily. *as needed for migraine, Can repeat if needed* 07/23/23   [provider]  diphenhydrAMINE  (BENADRYL ) 12.5 MG/5ML elixir Take 10 mLs (25 mg total) by mouth every 6 (six) hours as needed (nausea). 09/17/22   Kraig Peru, MD  EMGALITY  120 MG/ML SOSY Inject 120 mg into the skin every 28 (twenty-eight) days.    [provider]  EPINEPHRINE  0.3 mg/0.3 mL IJ SOAJ injection INJECT 0.3 MG INTO THE MUSCLE AS NEEDED FOR ANAPHYLAXIS. 04/25/23   Quillian Brunt, MD  famotidine  (PEPCID ) 40 MG/5ML suspension Take 2.5 mLs (20 mg total) by mouth daily. 03/30/23 05/16/23  Curatolo, Adam, DO  famotidine  (PEPCID ) 40 MG/5ML suspension TAKE 1.3 MLS (10.4 MG TOTAL) BY MOUTH DAILY - DISCARD BOTTLE AFTER 30 DAYS FROM PICKUP 01/21/24   Evelina Hippo, MD  FLORASTOR 250 MG capsule Take 250 mg by mouth 2 (two) times daily. Mid Morning and Mid Afternoon    [provider]  fluconazole  (DIFLUCAN ) 200 MG tablet Take 1 tablet (200 mg total) by mouth daily. 2 tabs PO x once, and then one tab daily for another 20 days 12/26/23   Armbruster, Lendon Queen, MD  FLUoxetine  (PROZAC ) 20 MG capsule TAKE 1 CAPSULE BY MOUTH EVERY DAY 11/29/23   Mozingo, Regina Nattalie, NP  fluticasone -salmeterol (ADVAIR HFA) 230-21 MCG/ACT inhaler Inhale 2 puffs into the lungs 2 (two) times daily. 01/08/24   Quillian Brunt, MD  Heparin  Na, Pork, Lock Flsh PF (BD HEPARIN  POSIFLUSH) 100 UNIT/ML SOLN USE 5 MLS IN PORT A CATH ONCE DAILY AFTER  MEDICATION ADMINISTRATION AS A HEPLOCK AS DIRECTED. 01/06/24     hydrocortisone  (CORTEF ) 10 MG tablet Take 1-2 tablets (10-20 mg total) by mouth See admin instructions. Take 20 mg in the am and 10mg  in  the evening. Take after completing prednisone  Patient taking differently: Take 10-20 mg by mouth See admin instructions. Take 20 mg in the am at 10 am and 10 mg in the afternoon. Take after completing prednisone  09/29/22   Kraig Peru, MD  hydrOXYzine  (ATARAX ) 25 MG tablet Take 1 tablet (25 mg total) by mouth every 6 (six) hours as needed for anxiety. 01/24/23   Haydee Lipa, MD  hyoscyamine  (LEVSIN/SL) 0.125 MG SL tablet Place 1 tablet (0.125 mg total) under the tongue every 6 (six) hours as needed (esophageal spasm). Up to 1.25 mg daily 12/22/23   Harris, Abigail, PA-C  lamoTRIgine  (LAMICTAL ) 200 MG tablet TAKE 1 TABLET BY MOUTH EVERYDAY AT BEDTIME 01/21/24   Mozingo, Regina Nattalie, NP  lamoTRIgine  (LAMICTAL ) 25 MG tablet Take two tablets every morning. 09/19/23   Mozingo, Regina Nattalie, NP  Lancets (ONETOUCH DELICA PLUS [place]) MISC 3 (three) times daily. for testing 06/12/21   [provider]  lansoprazole (PREVACID) 15 MG capsule Take 30 mg by mouth in the morning and at bedtime.    [provider]  lidocaine  (XYLOCAINE ) 2 % solution Use as directed 10 mLs in the mouth or throat every 4 (four) hours as needed (for esophageal pain and pain with swallowing.). 12/22/23   Harris, Abigail, PA-C  LORazepam  (ATIVAN ) 0.5 MG tablet Take 1 tablet (0.5 mg total) by mouth 3 (three) times daily as needed for anxiety. Patient taking differently: Place 0.5 mg into feeding tube 3 (three) times daily as needed for anxiety. [employer] For Nausea 02/11/23   Mozingo, Regina Nattalie, NP  nitroGLYCERIN  (NITROSTAT ) 0.4 MG SL tablet DISSOLVE 1 TAB UNDER THE TONGUE EVERY 5 MINUTES AS NEEDED FOR CHEST PAIN. MAX OF 3 DOSES, THEN 911. 07/03/23   Nahser, Lela Purple, MD  NURTEC 75 MG TBDP Take 1 tablet  by mouth daily as needed. 09/03/23   [provider]  OLANZapine  (ZYPREXA ) 5 MG tablet TAKE 1 TABLET BY MOUTH EVERYDAY AT BEDTIME 01/23/24   Mozingo, Regina Nattalie, NP  [facility] VERIO test strip 3 (three) times daily. for testing 06/12/21   [provider]  OXYGEN  Inhale 4-5 L/min into the lungs continuous.    [provider]  pantoprazole  (PROTONIX ) 40 MG tablet TAKE 1 TABLET BY MOUTH EVERY DAY 12/24/23   Patel, Kunjan B, MD  PROAIR  HFA 108 (90 Base) MCG/ACT inhaler Inhale 2 puffs into the lungs every 4 (four) hours as needed for wheezing or shortness of breath.    [provider]  QVAR  REDIHALER 80 MCG/ACT inhaler Inhale 1 puff into the lungs 2 (two) times daily. 05/10/23   [provider]  RABEprazole (ACIPHEX) 20 MG tablet Take 1 tablet (20 mg total) by mouth daily. 09/02/23   Evelina Hippo, MD  rosuvastatin  (CRESTOR ) 10 MG tablet Take 10 mg by mouth in the morning. Mid morning 02/28/18   [provider]  sacubitril -valsartan  (ENTRESTO ) 24-26 MG TAKE 1 TABLET BY MOUTH TWICE A DAY Patient taking differently: Take 1 tablet by mouth daily. 07/03/23   Nahser, Lela Purple, MD  sodium chloride  flush (BD POSIFLUSH) 0.9 % SOLN injection Use as directed for catheter maintenance 02/07/24     Sodium Chloride  Flush (NORMAL SALINE FLUSH) 0.9 % SOLN Use as directed for central venous catheter maintenance 01/31/23     Sodium Chloride  Flush (NORMAL SALINE FLUSH) 0.9 % SOLN Use as directed for catheter maintenance 11/07/23     Sodium Chloride  Flush (NORMAL SALINE FLUSH) 0.9 % SOLN Use  10 mLs daily as directed for catheter maintenance. 02/10/24     Sodium Chloride  Flush (NORMAL SALINE FLUSH) 0.9 % SOLN Use as directed for catheter maintenance. 02/10/24     sucralfate  (CARAFATE ) 1 GM/10ML suspension Take 1 g by mouth daily as needed (as directed for ulcers).    [provider]  SYNTHROID  100 MCG tablet Take 1 tablet (100 mcg total) by mouth every morning. Patient  taking differently: Take 100 mcg by mouth every morning. * Must be name brand* 10/19/21   Gudena, Vinay, MD  Tiotropium Bromide  Monohydrate (SPIRIVA  RESPIMAT) 1.25 MCG/ACT AERS Inhale 2 puffs into the lungs daily. 01/08/24   Quillian Brunt, MD  topiramate  (TOPAMAX ) 50 MG tablet Take 150 mg by mouth at bedtime. 12/25/19   [provider]  zolpidem  (AMBIEN  CR) 12.5 MG CR tablet TAKE ONE TABLET BY MOUTH AT BEDTIME AS NEEDED FOR SLEEP. 05/14/23     zonisamide (ZONEGRAN) 100 MG capsule Take 100 mg by mouth daily. 10/09/23 10/08/24  [provider]    No current facility-administered medications for this encounter.    Allergies as of 01/07/2024 - Review Complete 01/06/2024  Allergen Reaction Noted   Ferrous bisglycinate chelate [iron] Anaphylaxis and Other (See Comments) 07/15/2020   Mushroom extract complex (obsolete) Anaphylaxis 04/23/2019   Na ferric gluc cplx in sucrose Anaphylaxis 11/16/2014   Cymbalta [duloxetine hcl] Swelling and Anxiety 02/06/2011   Hydromorphone  Other (See Comments) 05/08/2016   Ondansetron  hcl Other (See Comments) 05/22/2022   Promethazine Other (See Comments) 08/03/2021   Succinylcholine Other (See Comments) 12/23/2018   Buprenorphine hcl Hives 02/03/2015   Compazine Other (See Comments) 02/06/2011   Duloxetine  02/06/2011   Metoclopramide Other (See Comments) 05/08/2016   Morphine and codeine Hives 02/06/2011   Ondansetron  Hives and Rash 02/03/2015   Promethazine hcl Hives 02/06/2011   Tegaderm ag mesh [silver ] Rash 02/06/2011    Family History  Adopted: Yes  Family history unknown: Yes    Social History   Socioeconomic History   Marital status: Married    Spouse name: Not on file   Number of children: Not on file   Years of education: Not on file   Highest education level: Not on file  Occupational History   Not on file  Tobacco Use   Smoking status: Never   Smokeless tobacco: Never  Vaping Use   Vaping status: Never Used   Substance and Sexual Activity   Alcohol use: Not Currently    Comment: social    Drug use: No   Sexual activity: Not Currently    Birth control/protection: Surgical  Other Topics Concern   Not on file  Social History Narrative   Not on file   Social Drivers of Health   Financial Resource Strain: Medium Risk (08/21/2017)   Received from [facility], [facility]   Overall Financial Resource Strain (CARDIA)    Difficulty of Paying Living Expenses: Somewhat hard  Food Insecurity: No Food Insecurity (04/23/2023)   Hunger Vital Sign    Worried About Running Out of Food in the Last Year: Never true    Ran Out of Food in the Last Year: Never true  Transportation Needs: No Transportation Needs (04/23/2023)   PRAPARE - Transportation    Lack of Transportation (Medical): No    Lack of Transportation (Non-Medical): No  Physical Activity: Unknown (08/21/2017)   Received from [facility], [facility]   Exercise Vital Sign  Days of Exercise per Week: 0 days    Minutes of Exercise per Session: Not on file  Stress: Stress Concern Present (08/21/2017)   Received from [facility], [facility]   [facility] of Occupational Health - Occupational Stress Questionnaire    Feeling of Stress : Very much  Social Connections: Unknown (02/12/2022)   Received from [facility], [facility]   Social Network    Social Network: Not on file  Intimate Partner Violence: Not At Risk (04/23/2023)   Humiliation, Afraid, Rape, and Kick questionnaire    Fear of Current or Ex-Partner: No    Emotionally Abused: No    Physically Abused: No    Sexually Abused: No    Physical Exam: Vital signs in last 24 hours: @Wt  75.3 kg   LMP  (LMP Unknown)   BMI 27.62 kg/m  GEN: NAD EYE: Sclerae anicteric ENT: MMM CV: Non-tachycardic Pulm: CTA b/l GI: Soft, NT/ND NEURO:  Alert & Oriented x 3   Harry Lindau, DO [facility]   02/11/2024 8:50 AM

## 2024-02-11 NOTE — Anesthesia Procedure Notes (Signed)
 Procedure Name: MAC Date/Time: 02/11/2024 10:36 AM  Performed by: Norvell Beers, CRNAPre-anesthesia Checklist: Patient identified, Emergency Drugs available, Suction available and Patient being monitored Patient Re-evaluated:Patient Re-evaluated prior to induction Oxygen  Delivery Method: Simple face mask Preoxygenation: Pre-oxygenation with 100% oxygen  Placement Confirmation: positive ETCO2

## 2024-02-11 NOTE — Interval H&P Note (Signed)
 History and Physical Interval Note:  02/11/2024 10:13 AM  [name]  has presented today for surgery, with the diagnosis of esophageal candidiasis, GERD.  The various methods of treatment have been discussed with the patient and family. After consideration of risks, benefits and other options for treatment, the patient has consented to  Procedure(s): EGD (ESOPHAGOGASTRODUODENOSCOPY) (N/A) BALLOON DILATION (N/A) as a surgical intervention.  The patient's history has been reviewed, patient examined, no change in status, stable for surgery.  I have reviewed the patient's chart and labs.  Questions were answered to the patient's satisfaction.     [name]

## 2024-02-11 NOTE — Anesthesia Preprocedure Evaluation (Signed)
 Anesthesia Evaluation  Patient identified by MRN, date of birth, ID band Patient awake    Reviewed: Allergy & Precautions, H&P , NPO status , Patient's Chart, lab work & pertinent test results  History of Anesthesia Complications (+) PONV and history of anesthetic complications  Airway Mallampati: II   Neck ROM: full    Dental   Pulmonary asthma    breath sounds clear to auscultation       Cardiovascular hypertension, +CHF   Rhythm:regular Rate:Normal     Neuro/Psych  Headaches, Seizures -,  PSYCHIATRIC DISORDERS Anxiety Depression       GI/Hepatic ,GERD  ,,  Endo/Other  Hypothyroidism  Adrenal insufficiency  Renal/GU      Musculoskeletal   Abdominal   Peds  Hematology   Anesthesia Other Findings   Reproductive/Obstetrics                             Anesthesia Physical Anesthesia Plan  ASA: 3  Anesthesia Plan: MAC   Post-op Pain Management:    Induction: Intravenous  PONV Risk Score and Plan: 3 and Propofol  infusion and Treatment may vary due to age or medical condition  Airway Management Planned: Nasal Cannula  Additional Equipment:   Intra-op Plan:   Post-operative Plan:   Informed Consent: I have reviewed the patients History and Physical, chart, labs and discussed the procedure including the risks, benefits and alternatives for the proposed anesthesia with the patient or authorized representative who has indicated his/her understanding and acceptance.     Dental advisory given  Plan Discussed with: CRNA, Anesthesiologist and Surgeon  Anesthesia Plan Comments:        Anesthesia Quick Evaluation

## 2024-02-11 NOTE — Op Note (Signed)
 [facility] Patient Name: [name] Procedure Date: 02/11/2024 MRN: [id number] Attending MD: Harry Lindau , MD, [id number] Date of Birth: [birth year] CSN: [id number] Age: 53 Admit Type: Outpatient Procedure:                Upper GI endoscopy Indications:              Dysphagia, Esophageal reflux, Candida esophagitis Providers:                Harry Lindau, MD, Lorenzo Romberg, RN, Arlin Benes, Technician, [place] [employer],CRNA Referring MD:              Medicines:                Monitored Anesthesia Care Complications:            No immediate complications. Estimated Blood Loss:     Estimated blood loss was minimal. Procedure:                Pre-Anesthesia Assessment:                           - Prior to the procedure, a History and Physical                            was performed, and patient medications and                            allergies were reviewed. The patient's tolerance of                            previous anesthesia was also reviewed. The risks                            and benefits of the procedure and the sedation                            options and risks were discussed with the patient.                            All questions were answered, and informed consent                            was obtained. Prior Anticoagulants: The patient has                            taken no anticoagulant or antiplatelet agents. ASA                            Grade Assessment: III - A patient with severe                            systemic disease. After reviewing the risks and  benefits, the patient was deemed in satisfactory                            condition to undergo the procedure.                           After obtaining informed consent, the endoscope was                            passed under direct vision. Throughout the                            procedure, the patient's blood pressure,  pulse, and                            oxygen  saturations were monitored continuously. The                            [id number]-H190 ([id number] Olympus endoscope was introduced                            through the mouth, and advanced to the second part                            of duodenum. The upper GI endoscopy was                            accomplished without difficulty. The patient                            tolerated the procedure well. Scope In: Scope Out: Findings:      One benign-appearing, intrinsic mild stenosis was found 16 cm from the       incisors. This stenosis measured 1 cm (in length). The stenosis was       traversed. The dilation site was examined following endoscope       reinsertion and showed mild mucosal disruption and moderate improvement       in luminal narrowing. Estimated blood loss was minimal.      Localized, white plaques were found in the lower third of the esophagus.       This is overall much improved compared with the previous endoscopy. The       upper and middle esophagus were otherwise without candidiasis. Biopsies       were taken with a cold forceps for histology. Estimated blood loss was       minimal.      The Z-line was regular and was found 38 cm from the incisors.      A 2 cm sliding type hiatal hernia was present.      Multiple small sessile polyps with no bleeding were found in the gastric       fundus.      The examined duodenum was normal. Impression:               - Benign-appearing esophageal stenosis.                           -  Esophageal plaques were found, suspicious for                            candidiasis. Biopsied.                           - Z-line regular, 38 cm from the incisors.                           - 2 cm hiatal hernia.                           - Multiple gastric polyps.                           - Normal examined duodenum. Moderate Sedation:      Not Applicable - Patient had care per Anesthesia. Recommendation:            - Patient has a contact number available for                            emergencies. The signs and symptoms of potential                            delayed complications were discussed with the                            patient. Return to normal activities tomorrow.                            Written discharge instructions were provided to the                            patient.                           - Soft diet today then slowly advance as tolerated                            tomorrow per post dilation protocol.                           - Continue present medications.                           - Await pathology results.                           - If biopsies are again confirmatory for esophageal                            candidiasis, plan for repeat course of fluconazole                             400 mg x 1 then 200 mg daily x [redacted] weeks along with  referral to the Infectious Disease clinic for                            recurrent esophageal candidiasis.                           - Repeat upper endoscopy PRN for retreatment.                           - Return to GI clinic at appointment to be                            scheduled. Procedure Code(s):        --- Professional ---                           [phone], Esophagogastroduodenoscopy, flexible,                            transoral; with biopsy, single or multiple Diagnosis Code(s):        --- Professional ---                           K22.2, Esophageal obstruction                           K22.9, Disease of esophagus, unspecified                           K44.9, Diaphragmatic hernia without obstruction or                            gangrene                           K31.7, Polyp of stomach and duodenum                           R13.10, Dysphagia, unspecified                           K21.9, Gastro-esophageal reflux disease without                            esophagitis CPT copyright 2022 American  Medical Association. All rights reserved. The codes documented in this report are preliminary and upon coder review may  be revised to meet current compliance requirements. Harry Lindau, MD 02/11/2024 11:02:47 AM Number of Addenda: 0

## 2024-02-11 NOTE — Discharge Instructions (Signed)
 YOU HAD AN ENDOSCOPIC PROCEDURE TODAY: Refer to the procedure report and other information in the discharge instructions given to you for any specific questions about what was found during the examination. If this information does not answer your questions, please call [place] office at [phone] to clarify.   YOU SHOULD EXPECT: Some feelings of bloating in the abdomen. Passage of more gas than usual. Walking can help get rid of the air that was put into your GI tract during the procedure and reduce the bloating. If you had a lower endoscopy (such as a colonoscopy or flexible sigmoidoscopy) you may notice spotting of blood in your stool or on the toilet paper. Some abdominal soreness may be present for a day or two, also.  DIET: Your first meal following the procedure should be a light meal and then it is ok to progress to your normal diet. A half-sandwich or bowl of soup is an example of a good first meal. Heavy or fried foods are harder to digest and may make you feel nauseous or bloated. Drink plenty of fluids but you should avoid alcoholic beverages for 24 hours. If you had a esophageal dilation, please see attached instructions for diet.    ACTIVITY: Your care partner should take you home directly after the procedure. You should plan to take it easy, moving slowly for the rest of the day. You can resume normal activity the day after the procedure however YOU SHOULD NOT DRIVE, use power tools, machinery or perform tasks that involve climbing or major physical exertion for 24 hours (because of the sedation medicines used during the test).   SYMPTOMS TO REPORT IMMEDIATELY: A gastroenterologist can be reached at any hour. Please call [phone]  for any of the following symptoms:  Following lower endoscopy (colonoscopy, flexible sigmoidoscopy) Excessive amounts of blood in the stool  Significant tenderness, worsening of abdominal pains  Swelling of the abdomen that is new, acute  Fever of 100 or  higher  Following upper endoscopy (EGD, EUS, ERCP, esophageal dilation) Vomiting of blood or coffee ground material  New, significant abdominal pain  New, significant chest pain or pain under the shoulder blades  Painful or persistently difficult swallowing  New shortness of breath  Black, tarry-looking or red, bloody stools  FOLLOW UP:  If any biopsies were taken you will be contacted by phone or by letter within the next 1-3 weeks. Call [phone]  if you have not heard about the biopsies in 3 weeks.  Please also call with any specific questions about appointments or follow up tests.YOU HAD AN ENDOSCOPIC PROCEDURE TODAY: Refer to the procedure report and other information in the discharge instructions given to you for any specific questions about what was found during the examination. If this information does not answer your questions, please call [place] office at [phone] to clarify.   YOU SHOULD EXPECT: Some feelings of bloating in the abdomen. Passage of more gas than usual. Walking can help get rid of the air that was put into your GI tract during the procedure and reduce the bloating. If you had a lower endoscopy (such as a colonoscopy or flexible sigmoidoscopy) you may notice spotting of blood in your stool or on the toilet paper. Some abdominal soreness may be present for a day or two, also.  DIET: Your first meal following the procedure should be a light meal and then it is ok to progress to your normal diet. A half-sandwich or bowl of soup is an example of a  good first meal. Heavy or fried foods are harder to digest and may make you feel nauseous or bloated. Drink plenty of fluids but you should avoid alcoholic beverages for 24 hours. If you had a esophageal dilation, please see attached instructions for diet.    ACTIVITY: Your care partner should take you home directly after the procedure. You should plan to take it easy, moving slowly for the rest of the day. You can resume  normal activity the day after the procedure however YOU SHOULD NOT DRIVE, use power tools, machinery or perform tasks that involve climbing or major physical exertion for 24 hours (because of the sedation medicines used during the test).   SYMPTOMS TO REPORT IMMEDIATELY: A gastroenterologist can be reached at any hour. Please call [phone]  for any of the following symptoms:  Following lower endoscopy (colonoscopy, flexible sigmoidoscopy) Excessive amounts of blood in the stool  Significant tenderness, worsening of abdominal pains  Swelling of the abdomen that is new, acute  Fever of 100 or higher  Following upper endoscopy (EGD, EUS, ERCP, esophageal dilation) Vomiting of blood or coffee ground material  New, significant abdominal pain  New, significant chest pain or pain under the shoulder blades  Painful or persistently difficult swallowing  New shortness of breath  Black, tarry-looking or red, bloody stools  FOLLOW UP:  If any biopsies were taken you will be contacted by phone or by letter within the next 1-3 weeks. Call [phone]  if you have not heard about the biopsies in 3 weeks.  Please also call with any specific questions about appointments or follow up tests.

## 2024-02-11 NOTE — Anesthesia Postprocedure Evaluation (Signed)
 Anesthesia Post Note  Patient: [name]  Procedure(s) Performed: EGD (ESOPHAGOGASTRODUODENOSCOPY)     Patient location during evaluation: Endoscopy Anesthesia Type: MAC Level of consciousness: awake and alert Pain management: pain level controlled Vital Signs Assessment: post-procedure vital signs reviewed and stable Respiratory status: spontaneous breathing, nonlabored ventilation, respiratory function stable and patient connected to nasal cannula oxygen  Cardiovascular status: stable and blood pressure returned to baseline Postop Assessment: no apparent nausea or vomiting Anesthetic complications: no   No notable events documented.  Last Vitals:  Vitals:   02/11/24 1110 02/11/24 1120  BP: 135/80 132/77  Pulse: 88 85  Resp: 18 16  Temp:  36.6 C  SpO2: 100% 100%    Last Pain:  Vitals:   02/11/24 1120  TempSrc: Temporal  PainSc: 6                  Bobbi Odell Benders

## 2024-02-11 NOTE — Transfer of Care (Signed)
 Immediate Anesthesia Transfer of Care Note  Patient: [name]  Procedure(s) Performed: EGD (ESOPHAGOGASTRODUODENOSCOPY)  Patient Location: PACU and Endoscopy Unit  Anesthesia Type:MAC  Level of Consciousness: drowsy  Airway & Oxygen  Therapy: Patient Spontanous Breathing and Patient connected to face mask oxygen   Post-op Assessment: Report given to RN and Post -op Vital signs reviewed and stable  Post vital signs: Reviewed and stable  Last Vitals:  Vitals Value Taken Time  BP 132/77 02/11/24 1120  Temp 36.6 C 02/11/24 1120  Pulse 83 02/11/24 1122  Resp 24 02/11/24 1127  SpO2 100 % 02/11/24 1122  Vitals shown include unfiled device data.  Last Pain:  Vitals:   02/11/24 1120  TempSrc: Temporal  PainSc: 6          Complications: No notable events documented.

## 2024-02-12 ENCOUNTER — Ambulatory Visit: Payer: Self-pay | Admitting: Gastroenterology

## 2024-02-12 LAB — SURGICAL PATHOLOGY

## 2024-02-13 ENCOUNTER — Encounter (HOSPITAL_COMMUNITY): Payer: Self-pay | Admitting: Gastroenterology

## 2024-02-13 ENCOUNTER — Other Ambulatory Visit: Payer: Self-pay

## 2024-02-13 ENCOUNTER — Telehealth: Payer: Self-pay

## 2024-02-13 ENCOUNTER — Other Ambulatory Visit (HOSPITAL_COMMUNITY): Payer: Self-pay

## 2024-02-13 MED ORDER — FLUCONAZOLE 200 MG PO TABS: ORAL_TABLET | ORAL | 0 refills | Status: AC

## 2024-02-13 NOTE — Telephone Encounter (Signed)
 Discussed results with patient, see 5/15 telephone encounter for details.

## 2024-02-13 NOTE — Telephone Encounter (Signed)
-----   Message from Annis Kinder sent at 02/12/2024  4:04 PM EDT ----- Regarding: RE: Lelon Putty Thanks [facility]. Will reach out to her as well.    [place], [place], or [place],  Would 1 of you mind giving her a call.  I did do esophageal dilation for a proximal esophageal stricture, so that might also be part of the blood clot issue, but she does have a long standing history of pulmonary issues likely also at play.  Will monitor temperatures.  If otherwise tolerating p.o. intake, recommend continued conservative care and monitoring.  Thanks. ----- Message ----- From: Zeke Hick, RN Sent: 02/12/2024   3:52 PM EDT To: Annis Kinder, DO Subject: Lelon Putty                                   Dr. Karene Oto,   I called Yamaris Clopton today to see how she was doing after her procedure with you yesterday. She said she was running a fever of 101.9 and coughing up some blood clots. She said she usually runs a fever after having anesthesia/procedures and she has been having a lot of pulmonary issues before yesterday. However, I asked her to call your office just in case you wanted to look into this further or give her other recommendations.  Thanks,   [place]

## 2024-02-13 NOTE — Telephone Encounter (Signed)
 Called and spoke with patient this morning to follow up on symptoms post EGD with dilation. Patient states that her fever has been controlled by alternating Tylenol  and Advil  every few hours. Patient is still have a lot of pain in her esophagus, she has Lidocaine  solution at home but it states to swish and spit. Patient reports that esophageal pain is much lower, wanting to know if OK for her to swish and swallow? Patient states that the pain is more intense this time, she is aware that it should continue to get better over time. Patient states that Tramadol  50 mg helps but she only has 2 pills left and is trying to hold onto those since is out of refills. Can you refill? Patient states that the Toradol  that she has causes nose bleeds. Patient states that she has pulmonary issues and has to wear a chest vest, which causes more difficulty and pain with coughing. Patient also had a concern regarding documentation in her chart. Patient states that it is mentioned that she takes Dilaudid  at home and she does not. Patient was adamant that she wanted this statement removed from her chart. I am not sure where it is documented, I do not see it on my end. I informed the patient that I would pass this information along to you to update if you are able. Patient states that I can send a MyChart message back with recommendations. Please advise, thanks.   Fluconazole  RX sent to pharmacy on file.

## 2024-02-13 NOTE — Telephone Encounter (Signed)
 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  While this was much improved compared with the previous endoscopy, I recommend repeat course of fluconazole  for hopeful eradication.  Please give Rx for fluconazole  200 mg tablets.  Take 400 mg on day 1, then 200 mg daily for 20 days, #22, RF 0. Please schedule follow-up appointment in the GI clinic.  If continued dysphagia or odynophagia, will likely plan for repeat upper endoscopy at [facility] for reevaluation of the proximal esophageal stricture along with confirmation of eradication of Candida esophagitis.  If lingering or recurrent Candida, will plan for referral back to the ID clinic.  If continued breakthrough reflux symptoms, may consider changing Dexilant  to Voquenza.  Will discuss that at her GI follow-up as well.

## 2024-02-14 ENCOUNTER — Telehealth: Payer: Self-pay | Admitting: Gastroenterology

## 2024-02-14 NOTE — Telephone Encounter (Signed)
 Called and spoke with patient. Patient reports that this message was for her Endocrinologist Dr. Ronelle Coffee. 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 Patient is aware that Dr. Ronelle Coffee will need to place orders. Pt will follow up with her office on Monday. I apologized to patient for the confusion. Pt was thankful for the return call.

## 2024-02-14 NOTE — Telephone Encounter (Signed)
 Inbound call from patient requesting a call to discuss referral regarding a port being placed. Patient stated she is doing a little better but not all the way. Requesting a call to discuss further. Please advise, thank you.

## 2024-02-15 ENCOUNTER — Other Ambulatory Visit: Payer: Self-pay | Admitting: Adult Health

## 2024-02-15 DIAGNOSIS — F410 Panic disorder [episodic paroxysmal anxiety] without agoraphobia: Secondary | ICD-10-CM

## 2024-02-18 ENCOUNTER — Telehealth (HOSPITAL_BASED_OUTPATIENT_CLINIC_OR_DEPARTMENT_OTHER): Payer: Self-pay

## 2024-02-18 NOTE — Telephone Encounter (Signed)
 I sent this order as urgent to [facility]

## 2024-02-18 NOTE — Telephone Encounter (Signed)
 Copied from CRM [id number]. Topic: Clinical - Prescription Issue >> Feb 10, 2024  1:59 PM Isabell A wrote: Reason for CRM: Patient states she spoke with [facility] and they still have not received the script for her nebulizer. >> Feb 18, 2024 11:26 AM Crist Dominion wrote: Patient states her nebulizer is completely broken and that no one has sent an order for a new one to [facility] even after multiple requests. Please call patient back and advise when this is complete.

## 2024-02-19 ENCOUNTER — Other Ambulatory Visit: Payer: Self-pay | Admitting: Adult Health

## 2024-02-19 ENCOUNTER — Telehealth (HOSPITAL_COMMUNITY): Payer: Self-pay

## 2024-02-19 DIAGNOSIS — F331 Major depressive disorder, recurrent, moderate: Secondary | ICD-10-CM

## 2024-02-19 DIAGNOSIS — J4550 Severe persistent asthma, uncomplicated: Secondary | ICD-10-CM | POA: Diagnosis not present

## 2024-02-19 DIAGNOSIS — J440 Chronic obstructive pulmonary disease with acute lower respiratory infection: Secondary | ICD-10-CM | POA: Diagnosis not present

## 2024-02-19 NOTE — Telephone Encounter (Signed)
 Pt is having lots of trouble with her catheter. She called IR to find out if she can have the catheter removed and her port put back in. I called Dr. Earleen Glazier office to see if they can fax me an order. AB

## 2024-02-20 ENCOUNTER — Other Ambulatory Visit (HOSPITAL_COMMUNITY): Payer: Self-pay | Admitting: Student

## 2024-02-20 ENCOUNTER — Other Ambulatory Visit (HOSPITAL_COMMUNITY): Payer: Self-pay | Admitting: Endocrinology

## 2024-02-20 ENCOUNTER — Other Ambulatory Visit (INDEPENDENT_AMBULATORY_CARE_PROVIDER_SITE_OTHER): Payer: Self-pay | Admitting: Otolaryngology

## 2024-02-20 DIAGNOSIS — R52 Pain, unspecified: Secondary | ICD-10-CM

## 2024-02-20 DIAGNOSIS — R509 Fever, unspecified: Secondary | ICD-10-CM

## 2024-02-21 NOTE — H&P (Signed)
 Chief Complaint: Patient was seen in consultation today for poor venus access, malfunctioning tunneled CVC at the request of Balan,Bindubal  Referring Physician(s): Balan,Bindubal  Supervising Physician: Elene Griffes  Patient Status: [facility] - Out-pt  History of Present Illness: [name] is a 53 y.o. 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   Patient is well known to IR service for port placement, removal, tunneled CVC placement and exchanges. She was firs seen by IR in August 2021 for left internal jugular port placement. The port was replaced with R internal jugular port in March 2023 due to the left port was causing skin erosion. The right internal jugular port was removed n 03/30/22 due to bacteremia, left arm PICC was placed at the same time.   Patient was seen by IR on 10/09/22 to achieve long term CVC, a tunneled CVC placement instead of a port placement was recommended by Dr. Mabel Savage due to patient's ong history of needing port revision/removal for a combination of skin/dermal breakdown and infection. A right internal jugular tunneled CVC was placed on 10/09/22.   Since then patient required multiple tunneled CVC exchanges due to not able to aspirate/flush, she required tunneled CVC exchange twice this year due to malfunctioning tunneled CVC (unable to flush/aspirate.) Patient reached out to her endocrinologist as well as IR and asked if she can  have port placed as she has been having many issues with the tunneled CVC. She presents to [facility] IR today for the port placement.   Of note, patient was admitted from 04/22/23  to 04/25/23  due to possible adrenal crisis and sepsis due to bacillus bacteremia, tunneled CVC removal and further w/u with TEE were recommended by ID but patient declined both. She was treated with  linezolid  x 2 weeks. Per char it does not appear that she had episode of sepsis/bacteremia since July 2024.   Patient laying in bed, not in acute distress. On O2 for chronic resp failure.  Denise headache, fever, chills, cough, chest pain, abdominal pain, nausea ,vomiting, and bleeding.  Patient states that she has very poor venous access and that is the reason why she has to have a tunneled CVC/port. She is not using the tunneled CVC for regular infusion/blood draws currently.   Past Medical History:  Diagnosis Date   Addison's disease (HCC)    Adrenal insufficiency (HCC)    Anemia    Anxiety    Aortic stenosis    Aortic stenosis    Appendicitis 12/19/2009   Appendicitis    Breast cancer (HCC)    STATUS POST BILATERAL MASTECTOMY. STATUS POST RECONSTRUCTION. SHE HAD SILICONE BREAST IMPLANTS AND THE LEFT IMPLANT IS LEAKING SLIGHTLY   Cellulitis of right middle finger 11/07/2018   Cervical cancer (HCC) 12/23/2018   Chest pain    CHF with right heart failure (HCC) 04/17/2017   Chronic respiratory failure with hypoxia (HCC) 12/23/2018   CKD (chronic kidney disease) stage 3, GFR 30-59 ml/min (HCC)    Cough variant asthma 04/13/2019   Depression    Functional neurological symptom disorder with mixed symptoms 2023  GERD (gastroesophageal reflux disease)    takes Dexilant  and carafate  and gi coctail    Headache    migraines on a daily and monthly regimen    Heart murmur    History of kidney stones    Hodgkin lymphoma (HCC)    STATUS POST MANTLE RADIATION   Hodgkin's lymphoma (HCC)    1987   Hypertension    Hypoxia     Multiple lung nodules    bilateral   Necrotizing fasciitis (HCC) 12/23/2018   Non-ischemic cardiomyopathy (HCC)    Osteoporosis    Ovarian tumor (benign)    bilateral   Palpitations    Pituitary adenoma (HCC) 12/23/2018   Pneumonia    PONV (postoperative nausea and vomiting)    Pre-diabetes    per pt; no meds   Pulmonary hypertension (HCC) 12/23/2018   Raynaud phenomenon    Right heart failure (HCC) 04/17/2017   Seizures (HCC)    last febrile seizure was approx 3 weeks ago per report on 12/01/2020   Supplemental oxygen  dependent    3 liters   SVT (supraventricular tachycardia) (HCC)    Tachycardia    Thyroid  cancer (HCC)    STATUS POST SURGICAL REMOVAL-CURRENT ON THYROID  REPLACEMENT    Past Surgical History:  Procedure Laterality Date   ABDOMINAL HYSTERECTOMY     AMPUTATION Left 01/30/2019   Procedure: Left Index finger amputation with flap reconstruction and repair reconstruction;  Surgeon: Ronn Cohn, MD;  Location: [facility] OR;  Service: Orthopedics;  Laterality: Left;   APPENDECTOMY     BIOPSY OF SKIN SUBCUTANEOUS TISSUE AND/OR MUCOUS MEMBRANE  12/26/2023   Procedure: BIOPSY, SKIN, SUBCUTANEOUS TISSUE, OR MUCOUS MEMBRANE;  Surgeon: Ace Holder, MD;  Location: [facility] ENDOSCOPY;  Service: Gastroenterology;;   breast implants and removal      breast implants but leaking      CARDIAC CATHETERIZATION  05/18/2009   NORMAL CATH   CESAREAN SECTION  2007   and 1997   COLONOSCOPY     ESOPHAGEAL DILATION  02/11/2024   Procedure: DILATION, ESOPHAGUS;  Surgeon: Annis Kinder, DO;  Location: [facility] ENDOSCOPY;  Service: Gastroenterology;;   ESOPHAGOGASTRODUODENOSCOPY N/A 12/26/2023   Procedure: EGD (ESOPHAGOGASTRODUODENOSCOPY);  Surgeon: Ace Holder, MD;  Location: [facility];  Service: Gastroenterology;  Laterality: N/A;   ESOPHAGOGASTRODUODENOSCOPY N/A 02/11/2024   Procedure: EGD (ESOPHAGOGASTRODUODENOSCOPY);  Surgeon: Annis Kinder, DO;  Location: [facility] END[facility];   Service: Gastroenterology;  Laterality: N/A;   HOT HEMOSTASIS N/A 12/26/2023   Procedure: EGD, WITH ARGON PLASMA COAGULATION;  Surgeon: Ace Holder, MD;  Location: [facility] ENDOS[facility];  Service: Gastroenterology;  Laterality: N/A;   hx of chemotherapy      hx of radiation therapy      I & D EXTREMITY Left 12/23/2018   Procedure: IRRIGATION AND DEBRIDEMENT HAND / INDEX FINGER;  Surgeon: Ronn Cohn, MD;  Location: [facility] OR;  Service: Orthopedics;  Laterality: Left;   IR CV LINE INJECTION  03/22/2022   IR FLUORO GUIDE CV LINE RIGHT  10/09/2022   IR FLUORO GUIDE CV LINE RIGHT  01/18/2023   IR FLUORO GUIDE CV LINE RIGHT  03/26/2023   IR FLUORO GUIDE CV LINE RIGHT  10/10/2023   IR FLUORO GUIDE CV LINE RIGHT  01/06/2024   IR IMAGING GUIDED PORT INSERTION  05/06/2020   IR IMAGING GUIDED PORT INSERTION  12/04/2021   IR REMOVAL TUN ACCESS W/ PORT W/O FL MOD SED  04/27/2020   IR REMOVAL TUN ACCESS W/ PORT W/O FL MOD  SED  12/04/2021   IR REMOVAL TUN ACCESS W/ PORT W/O FL MOD SED  03/30/2022   IR US  GUIDE VASC ACCESS RIGHT  10/09/2022   KIDNEY STONE SURGERY     LUMBAR PUNCTURE W/ INTRATHECAL CHEMOTHERAPY     MASTECTOMY     PITUITARY SURGERY     RIGHT/LEFT HEART CATH AND CORONARY ANGIOGRAPHY N/A 04/02/2018   Procedure: RIGHT/LEFT HEART CATH AND CORONARY ANGIOGRAPHY;  Surgeon: Odie Benne, MD;  [facility]: [facility] INVASIVE CV LAB;  Service: Cardiovascular;  Laterality: N/A;   RIGHT/LEFT HEART CATH AND CORONARY ANGIOGRAPHY N/A 08/31/2022   Procedure: RIGHT/LEFT HEART CATH AND CORONARY ANGIOGRAPHY;  Surgeon: Mardell Shade, MD;  [facility]: [facility] INVAS[facility] CV LAB;  Service: Cardiovascular;  Laterality: N/A;   TOOTH EXTRACTION N/A 12/05/2020   Procedure: DENTAL RESTORATION/EXTRACTIONS;  Surgeon: Auburn Leak, MD;  Location: [facility] ORS;  Service: Oral Surgery;  Laterality: N/A;   TOTAL THYROIDECTOMY     VIDEO BRONCHOSCOPY Bilateral 11/14/2018   Procedure: VIDEO BRONCHOSCOPY WITHOUT FLUORO;   Surgeon: Quillian Brunt, MD;  Location: [facility] ENDOSCOPY;  Service: Cardiopulmonary;  Laterality: Bilateral;   VIDEO BRONCHOSCOPY WITH ENDOBRONCHIAL ULTRASOUND N/A 11/19/2018   Procedure: VIDEO BRONCHOSCOPY WITH ENDOBRONCHIAL ULTRASOUND;  Surgeon: Quillian Brunt, MD;  Location: [facility] OR;  Service: Thoracic;  Laterality: N/A;    Allergies: Ferrous bisglycinate chelate [iron], Mushroom extract complex (obsolete), Na ferric gluc cplx in sucrose, Cymbalta [duloxetine hcl], Hydromorphone , Ondansetron  hcl, Promethazine, Succinylcholine, Buprenorphine hcl, Compazine, Duloxetine, Metoclopramide, Morphine and codeine, Ondansetron , Promethazine hcl, and Tegaderm ag mesh [silver ]  Medications: Prior to Admission medications   Medication Sig Start Date End Date Taking? Authorizing Provider  albuterol  (PROVENTIL ) (2.5 MG/3ML) 0.083% nebulizer solution INHALE 3 ML BY NEBULIZATION EVERY 6 HOURS AS NEEDED FOR WHEEZING OR SHORTNESS OF BREATH 12/13/23   Quillian Brunt, MD  albuterol  (VENTOLIN  HFA) 108 [phone] Base) MCG/ACT inhaler Inhale 2 puffs into the lungs every 6 (six) hours as needed for wheezing or shortness of breath. 01/08/24   Quillian Brunt, MD  ALPRAZolam  (XANAX ) 1 MG tablet 1 tablet daily and 1 tablet PRN no more than 10 days a month. 01/28/24   Mozingo, Regina Nattalie, NP  aspirin  EC 81 MG tablet Take 1 tablet (81 mg total) by mouth daily. Swallow whole. Patient taking differently: Take 81 mg by mouth at bedtime. Chewable 07/16/22   Nahser, Lela Purple, MD  Bismuth Tribromoph-Petrolatum (XEROFORM OCCLUSIVE GAUZE STRIP) PADS Apply 1 each topically as directed. 09/17/22   Kraig Peru, MD  bisoprolol  (ZEBETA ) 5 MG tablet TAKE 1/2 TABLET BY MOUTH DAILY 01/29/24   Nahser, Lela Purple, MD  bumetanide  (BUMEX ) 1 MG tablet TAKE 1 TABLET BY MOUTH TWICE A DAY 01/29/24   Nahser, Lela Purple, MD  buPROPion  (WELLBUTRIN  XL) 150 MG 24 hr tablet TAKE 1 TABLET BY MOUTH EVERY DAY 01/28/24   Mozingo, Regina Nattalie, NP   clonazePAM  (KLONOPIN ) 1 MG tablet Take 1 tablet (1 mg total) by mouth 2 (two) times daily. 11/04/23   Mozingo, Regina Nattalie, NP  cyclobenzaprine  (FLEXERIL ) 5 MG tablet Take 1 tablet (5 mg total) by mouth 3 (three) times daily as needed for muscle spasms. 06/20/23   Leath-Warren, Belen Bowers, NP  dexlansoprazole  (DEXILANT ) 60 MG capsule Take 1 capsule (60 mg total) by mouth daily. 12/19/23   May, Deanna J, NP  diclofenac  (VOLTAREN ) 50 MG EC tablet Take 50 mg by mouth 2 (two) times daily. *as needed for migraine, Can repeat if needed* 07/23/23  [provider]  diphenhydrAMINE  (BENADRYL ) 12.5 MG/5ML elixir Take 10 mLs (25 mg total) by mouth every 6 (six) hours as needed (nausea). 09/17/22   Kraig Peru, MD  EMGALITY  120 MG/ML SOSY Inject 120 mg into the skin every 28 (twenty-eight) days.    [provider]  EPINEPHRINE  0.3 mg/0.3 mL IJ SOAJ injection INJECT 0.3 MG INTO THE MUSCLE AS NEEDED FOR ANAPHYLAXIS. 04/25/23   Quillian Brunt, MD  famotidine  (PEPCID ) 40 MG/5ML suspension Take 2.5 mLs (20 mg total) by mouth daily. 03/30/23 05/16/23  Curatolo, Adam, DO  famotidine  (PEPCID ) 40 MG/5ML suspension TAKE 1.3 MLS (10.4 MG TOTAL) BY MOUTH DAILY - DISCARD BOTTLE AFTER 30 DAYS FROM PICKUP 02/21/24   Evelina Hippo, MD  FLORASTOR 250 MG capsule Take 250 mg by mouth 2 (two) times daily. Mid Morning and Mid Afternoon    [provider]  fluconazole  (DIFLUCAN ) 200 MG tablet Take 2 tablets (400 mg total) by mouth daily for 1 day, THEN 1 tablet (200 mg total) daily for 20 days. 02/13/24 03/05/24  Cirigliano, Vito V, DO  FLUoxetine  (PROZAC ) 20 MG capsule TAKE 1 CAPSULE BY MOUTH EVERY DAY 11/29/23   Mozingo, Regina Nattalie, NP  fluticasone -salmeterol (ADVAIR HFA) 230-21 MCG/ACT inhaler Inhale 2 puffs into the lungs 2 (two) times daily. 01/08/24   Quillian Brunt, MD  Heparin  Na, Pork, Lock Flsh PF (BD HEPARIN  POSIFLUSH) 100 UNIT/ML SOLN USE 5 MLS IN PORT A CATH ONCE DAILY AFTER MEDICATION  ADMINISTRATION AS A HEPLOCK AS DIRECTED. 01/06/24     hydrocortisone  (CORTEF ) 10 MG tablet Take 1-2 tablets (10-20 mg total) by mouth See admin instructions. Take 20 mg in the am and 10mg  in the evening. Take after completing prednisone  Patient taking differently: Take 10-20 mg by mouth See admin instructions. Take 20 mg in the am at 10 am and 10 mg in the afternoon. Take after completing prednisone  09/29/22   Patel, Pranav M, MD  hydrOXYzine  (ATARAX ) 25 MG tablet Take 1 tablet (25 mg total) by mouth every 6 (six) hours as needed for anxiety. 01/24/23   Haydee Lipa, MD  hyoscyamine  (LEVSIN/SL) 0.125 MG SL tablet Place 1 tablet (0.125 mg total) under the tongue every 6 (six) hours as needed (esophageal spasm). Up to 1.25 mg daily 12/22/23   Harris, Abigail, PA-C  lamoTRIgine  (LAMICTAL ) 200 MG tablet TAKE 1 TABLET BY MOUTH EVERYDAY AT BEDTIME 02/19/24   Mozingo, Regina Nattalie, NP  lamoTRIgine  (LAMICTAL ) 25 MG tablet TAKE TWO TABLETS EVERY MORNING. 02/19/24   Mozingo, Regina Nattalie, NP  Lancets (ONETOUCH DELICA PLUS Catarina) MISC 3 (three) times daily. for testing 06/12/21   [provider]  lansoprazole (PREVACID) 15 MG capsule Take 30 mg by mouth in the morning and at bedtime.    [provider]  lidocaine  (XYLOCAINE ) 2 % solution Use as directed 10 mLs in the mouth or throat every 4 (four) hours as needed (for esophageal pain and pain with swallowing.). 12/22/23   Harris, Abigail, PA-C  LORazepam  (ATIVAN ) 0.5 MG tablet Take 1 tablet (0.5 mg total) by mouth 3 (three) times daily as needed for anxiety. Patient taking differently: Place 0.5 mg into feeding tube 3 (three) times daily as needed for anxiety. [employer] For Nausea 02/11/23   Mozingo, Regina Nattalie, NP  nitroGLYCERIN  (NITROSTAT ) 0.4 MG SL tablet DISSOLVE 1 TAB UNDER THE TONGUE EVERY 5 MINUTES AS NEEDED FOR CHEST PAIN. MAX OF 3 DOSES, THEN 911. 07/03/23   Nahser, Lela Purple, MD  NURTEC 75 MG TBDP Take 1 tablet by mouth  daily as needed. 09/03/23   [provider]  OLANZapine  (ZYPREXA ) 5 MG tablet TAKE 1 TABLET BY MOUTH EVERYDAY AT BEDTIME 01/23/24   Mozingo, Regina Nattalie, NP  [facility] VERIO test strip 3 (three) times daily. for testing 06/12/21   [provider]  OXYGEN  Inhale 4-5 L/min into the lungs continuous.    [provider]  pantoprazole  (PROTONIX ) 40 MG tablet TAKE 1 TABLET BY MOUTH EVERY DAY 12/24/23   Patel, Kunjan B, MD  PROAIR  HFA 108 (90 Base) MCG/ACT inhaler Inhale 2 puffs into the lungs every 4 (four) hours as needed for wheezing or shortness of breath.    [provider]  QVAR  REDIHALER 80 MCG/ACT inhaler Inhale 1 puff into the lungs 2 (two) times daily. 05/10/23   [provider]  RABEprazole (ACIPHEX) 20 MG tablet Take 1 tablet (20 mg total) by mouth daily. 09/02/23   Evelina Hippo, MD  rosuvastatin  (CRESTOR ) 10 MG tablet Take 10 mg by mouth in the morning. Mid morning 02/28/18   [provider]  sacubitril -valsartan  (ENTRESTO ) 24-26 MG TAKE 1 TABLET BY MOUTH TWICE A DAY Patient taking differently: Take 1 tablet by mouth daily. 07/03/23   Nahser, Lela Purple, MD  sodium chloride  flush (BD POSIFLUSH) 0.9 % SOLN injection Use as directed for catheter maintenance 02/07/24     Sodium Chloride  Flush (NORMAL SALINE FLUSH) 0.9 % SOLN Use as directed for central venous catheter maintenance 01/31/23     Sodium Chloride  Flush (NORMAL SALINE FLUSH) 0.9 % SOLN Use as directed for catheter maintenance 11/07/23     Sodium Chloride  Flush (NORMAL SALINE FLUSH) 0.9 % SOLN Use 10 mLs as directed for catheter maintenance. 02/10/24     Sodium Chloride  Flush (NORMAL SALINE FLUSH) 0.9 % SOLN Use as directed for catheter maintenance. 02/10/24     sucralfate  (CARAFATE ) 1 GM/10ML suspension Take 1 g by mouth daily as needed (as directed for ulcers).    [provider]  SYNTHROID  100 MCG tablet Take 1 tablet (100 mcg total) by mouth every morning. Patient taking  differently: Take 100 mcg by mouth every morning. * Must be name brand* 10/19/21   Gudena, Vinay, MD  Tiotropium Bromide  Monohydrate (SPIRIVA  RESPIMAT) 1.25 MCG/ACT AERS Inhale 2 puffs into the lungs daily. 01/08/24   Quillian Brunt, MD  topiramate  (TOPAMAX ) 50 MG tablet Take 150 mg by mouth at bedtime. 12/25/19   [provider]  zolpidem  (AMBIEN  CR) 12.5 MG CR tablet TAKE ONE TABLET BY MOUTH AT BEDTIME AS NEEDED FOR SLEEP. 05/14/23     zonisamide (ZONEGRAN) 100 MG capsule Take 100 mg by mouth daily. 10/09/23 10/08/24  [provider]     Family History  Adopted: Yes  Family history unknown: Yes    Social History   Socioeconomic History   Marital status: Married    Spouse name: Not on file   Number of children: Not on file   Years of education: Not on file   Highest education level: Not on file  Occupational History   Not on file  Tobacco Use   Smoking status: Never   Smokeless tobacco: Never  Vaping Use   Vaping status: Never Used  Substance and Sexual Activity   Alcohol use: Not Currently    Comment: social    Drug use: No   Sexual activity: Not Currently    Birth control/protection: Surgical  Other Topics Concern   Not on file  Social History Narrative   Not on file   Social Drivers of Health   Financial Resource Strain: Medium Risk (08/21/2017)   Received from [facility], [facility]   Overall Financial Resource Strain (CARDIA)    Difficulty of Paying Living Expenses: Somewhat hard  Food Insecurity: No Food Insecurity (04/23/2023)   Hunger Vital Sign    Worried About Running Out of Food in the Last Year: Never true    Ran Out of Food in the Last Year: Never true  Transportation Needs: No Transportation Needs (04/23/2023)   PRAPARE - Administrator, Civil Service (Medical): No    Lack of Transportation (Non-Medical): No  Physical Activity: Unknown (08/21/2017)   Received from [facility], [facility]   Exercise Vital Sign    Days of Exercise per Week: 0 days    Minutes of Exercise per Session: Not on file  Stress: Stress Concern Present (08/21/2017)   Received from [facility], [facility]   [facility] of Occupational Health - Occupational Stress Questionnaire    Feeling of Stress : Very much  Social Connections: Unknown (02/12/2022)   Received from [facility], [facility]   Social Network    Social Network: Not on file     Review of Systems: A 12 point ROS discussed and pertinent positives are indicated in the HPI above.  All other systems are negative.  Vital Signs: BP 138/80   Pulse (!) 101   Temp 98.2 F (36.8 C) (Oral)   Resp (!) 23   Ht 5\' 5"  (1.651 m)   Wt 166 lb (75.3 kg)   LMP  (LMP Unknown)   SpO2 99%   BMI 27.62 kg/m    Physical Exam Vitals reviewed.  Constitutional:      General: She is not in acute distress.    Appearance: She is not ill-appearing.  HENT:     Head: Normocephalic and atraumatic.     Mouth/Throat:     Mouth: Mucous membranes are moist.     Pharynx: Oropharynx is clear.  Cardiovascular:     Rate and Rhythm: Normal rate and regular rhythm.     Heart sounds: Normal heart sounds.  Pulmonary:     Effort: Pulmonary effort is normal.     Breath sounds: Normal breath sounds.  Abdominal:     General: Abdomen is flat. Bowel sounds are normal.     Palpations: Abdomen is soft.  Musculoskeletal:     Cervical back: Neck supple.  Skin:    General: Skin is warm and dry.     Coloration: Skin is not jaundiced or pale.     Comments: + tunneled CVC in right upper chest Well healed scar in left upper chest from previous port removal   Neurological:     Mental Status: She is alert and oriented to person, place, and time.  Psychiatric:        Mood and Affect: Mood normal.        Behavior: Behavior normal.        Judgment: Judgment normal.     MD  Evaluation Airway: WNL Heart: WNL Abdomen: WNL Chest/ Lungs: WNL ASA  Classification: 3 Mallampati/Airway Score: One  Imaging: No results found.  Labs:  CBC: Recent Labs    07/18/23 1240 07/26/23 1433 09/03/23 1315 12/07/23 1259  WBC 11.7* 11.4* 13.6* 9.6  HGB 10.3* 10.0* 9.6* 9.6*  HCT 33.6* 32.7*  31.1* 31.2*  PLT 337 314 332 370    COAGS: Recent Labs    03/09/23 1542  INR 1.1  APTT 43*    BMP: Recent Labs    07/18/23 1240 07/26/23 1433 09/03/23 1315 12/07/23 1259  NA 139 140 141 141  K 3.1* 3.2* 3.2* 3.6  CL 100 103 105 104  CO2 30 24 26 28   GLUCOSE 94 169* 170* 79  BUN 31* 30* 28* 28*  CALCIUM  9.4 9.1 8.4* 9.1  CREATININE 1.37* 1.25* 1.45* 1.31*  GFRNONAA 46* 52* 43* 49*    LIVER FUNCTION TESTS: Recent Labs    03/09/23 1542 04/22/23 1735 07/26/23 1433 09/03/23 1315  BILITOT 0.4 0.5 0.6 0.3  AST 22 22 20 18   ALT 36 32 30 25  ALKPHOS 55 63 56 56  PROT 7.3 7.9 7.5 6.8  ALBUMIN 3.6 3.9 3.9 3.8    TUMOR MARKERS: No results for input(s): "AFPTM", "CEA", "CA199", "CHROMGRNA" in the last 8760 hours.  Assessment and Plan: 53 y.o. female with poor venous access and in need of long term CVC for poor venous access who presents for tunneled CVC removal and PAC placement.   NPO since MN VSS On Asa 81 g, no need to d/c  16 allergies, not allergic to Fentanyl , Versed , chlorhexidine    Risks and benefits of image guided port-a-catheter placement was discussed with the patient including, but not limited to bleeding, infection, pneumothorax, or fibrin sheath development and need for additional procedures.  All of the patient's questions were answered, patient is agreeable to proceed. Consent signed and in chart.   Thank you for this interesting consult.  I greatly enjoyed meeting [name] and look forward to participating in their care.  A copy of this report was sent to the requesting provider on this date.  Electronically Signed: Darel Ebbs, PA-C 02/25/2024, 2:14 PM   I spent a total of  30 Minutes   in face to face in clinical consultation, greater than 50% of which was counseling/coordinating care for tunneled CVC removal and port placement.   This chart was dictated using voice recognition software.  Despite best efforts to proofread,  errors can occur which can change the documentation meaning.

## 2024-02-24 ENCOUNTER — Other Ambulatory Visit: Payer: Self-pay | Admitting: Cardiovascular Disease

## 2024-02-25 ENCOUNTER — Encounter (HOSPITAL_COMMUNITY): Payer: Self-pay

## 2024-02-25 ENCOUNTER — Ambulatory Visit (HOSPITAL_COMMUNITY)
Admission: RE | Admit: 2024-02-25 | Discharge: 2024-02-25 | Disposition: A | Discharge status: Home / Self Care | Source: Ambulatory Visit | Attending: Endocrinology | Admitting: Endocrinology

## 2024-02-25 DIAGNOSIS — R52 Pain, unspecified: Secondary | ICD-10-CM | POA: Diagnosis not present

## 2024-02-25 DIAGNOSIS — Z7982 Long term (current) use of aspirin: Secondary | ICD-10-CM | POA: Diagnosis not present

## 2024-02-25 DIAGNOSIS — Z79899 Other long term (current) drug therapy: Secondary | ICD-10-CM | POA: Insufficient documentation

## 2024-02-25 DIAGNOSIS — J9610 Chronic respiratory failure, unspecified whether with hypoxia or hypercapnia: Secondary | ICD-10-CM | POA: Insufficient documentation

## 2024-02-25 DIAGNOSIS — N183 Chronic kidney disease, stage 3 unspecified: Secondary | ICD-10-CM | POA: Diagnosis not present

## 2024-02-25 DIAGNOSIS — I272 Pulmonary hypertension, unspecified: Secondary | ICD-10-CM | POA: Insufficient documentation

## 2024-02-25 DIAGNOSIS — Z452 Encounter for adjustment and management of vascular access device: Secondary | ICD-10-CM | POA: Insufficient documentation

## 2024-02-25 DIAGNOSIS — C819A Hodgkin lymphoma, unspecified, in remission: Secondary | ICD-10-CM | POA: Insufficient documentation

## 2024-02-25 DIAGNOSIS — Z8541 Personal history of malignant neoplasm of cervix uteri: Secondary | ICD-10-CM | POA: Diagnosis not present

## 2024-02-25 DIAGNOSIS — I50810 Right heart failure, unspecified: Secondary | ICD-10-CM | POA: Insufficient documentation

## 2024-02-25 DIAGNOSIS — I130 Hypertensive heart and chronic kidney disease with heart failure and stage 1 through stage 4 chronic kidney disease, or unspecified chronic kidney disease: Secondary | ICD-10-CM | POA: Insufficient documentation

## 2024-02-25 DIAGNOSIS — R509 Fever, unspecified: Secondary | ICD-10-CM | POA: Insufficient documentation

## 2024-02-25 DIAGNOSIS — Z9013 Acquired absence of bilateral breasts and nipples: Secondary | ICD-10-CM | POA: Diagnosis not present

## 2024-02-25 DIAGNOSIS — E271 Primary adrenocortical insufficiency: Secondary | ICD-10-CM | POA: Diagnosis not present

## 2024-02-25 DIAGNOSIS — Z853 Personal history of malignant neoplasm of breast: Secondary | ICD-10-CM | POA: Diagnosis not present

## 2024-02-25 DIAGNOSIS — T82598A Other mechanical complication of other cardiac and vascular devices and implants, initial encounter: Secondary | ICD-10-CM | POA: Diagnosis not present

## 2024-02-25 HISTORY — PX: IR IMAGING GUIDED PORT INSERTION: IMG5740

## 2024-02-25 HISTORY — PX: IR REMOVAL TUN CV CATH W/O FL: IMG2289

## 2024-02-25 MED ORDER — DIPHENHYDRAMINE HCL 50 MG/ML IJ SOLN
INTRAMUSCULAR | Status: AC
  Filled 2024-02-25: qty 1

## 2024-02-25 MED ORDER — HEPARIN SOD (PORK) LOCK FLUSH 100 UNIT/ML IV SOLN
INTRAVENOUS | Status: AC
  Filled 2024-02-25: qty 5

## 2024-02-25 MED ORDER — LIDOCAINE-EPINEPHRINE 1 %-1:100000 IJ SOLN
INTRAMUSCULAR | Status: AC
  Filled 2024-02-25: qty 1

## 2024-02-25 MED ORDER — MIDAZOLAM HCL 2 MG/2ML IJ SOLN
INTRAMUSCULAR | Status: AC
  Filled 2024-02-25: qty 2

## 2024-02-25 MED ORDER — MIDAZOLAM HCL 2 MG/2ML IJ SOLN
INTRAMUSCULAR | Status: AC | PRN
  Administered 2024-02-25 (×4): 1 mg via INTRAVENOUS

## 2024-02-25 MED ORDER — FENTANYL CITRATE (PF) 100 MCG/2ML IJ SOLN
INTRAMUSCULAR | Status: AC | PRN
  Administered 2024-02-25 (×4): 50 ug via INTRAVENOUS

## 2024-02-25 MED ORDER — FENTANYL CITRATE (PF) 100 MCG/2ML IJ SOLN
INTRAMUSCULAR | Status: AC
Start: 2024-02-25 — End: ?
  Filled 2024-02-25: qty 2

## 2024-02-25 MED ORDER — LIDOCAINE HCL 1 % IJ SOLN
INTRAMUSCULAR | Status: AC
  Filled 2024-02-25: qty 20

## 2024-02-25 MED ORDER — DIPHENHYDRAMINE HCL 50 MG/ML IJ SOLN
INTRAMUSCULAR | Status: AC | PRN
  Administered 2024-02-25: 50 mg via INTRAVENOUS

## 2024-02-25 MED ORDER — FENTANYL CITRATE (PF) 100 MCG/2ML IJ SOLN
INTRAMUSCULAR | Status: AC
  Filled 2024-02-25: qty 2

## 2024-02-25 NOTE — Procedures (Signed)
 Interventional Radiology Procedure:   Indications: Poor venous access and current tunneled catheter is not working  Procedure: Port placement and removal of tunneled central line  Findings: Left jugular port, tip at SVC/RA junction. Removal of right jugular tunneled central venous catheter   Complications: None     EBL: Minimal, less than 10 ml  Plan: Discharge in one hour.  Keep port site and incisions dry for at least 24 hours.     Susan Rosano R. Julietta Ogren, MD  Pager: [phone]

## 2024-02-26 ENCOUNTER — Other Ambulatory Visit: Payer: Self-pay | Admitting: Adult Health

## 2024-02-26 ENCOUNTER — Other Ambulatory Visit: Payer: Self-pay | Admitting: Radiology

## 2024-02-26 ENCOUNTER — Ambulatory Visit (HOSPITAL_COMMUNITY)
Admission: RE | Admit: 2024-02-26 | Discharge: 2024-02-26 | Disposition: A | Discharge status: Home / Self Care | Source: Ambulatory Visit | Attending: Internal Medicine | Admitting: Internal Medicine

## 2024-02-26 ENCOUNTER — Other Ambulatory Visit (HOSPITAL_COMMUNITY): Payer: Self-pay | Admitting: Internal Medicine

## 2024-02-26 DIAGNOSIS — E2740 Unspecified adrenocortical insufficiency: Secondary | ICD-10-CM

## 2024-02-26 DIAGNOSIS — F410 Panic disorder [episodic paroxysmal anxiety] without agoraphobia: Secondary | ICD-10-CM

## 2024-02-26 MED ORDER — AMOXICILLIN-POT CLAVULANATE 875-125 MG PO TABS: 1.0000 | ORAL_TABLET | Freq: Two times a day (BID) | ORAL | 0 refills | Status: DC

## 2024-02-26 NOTE — Telephone Encounter (Signed)
 Pt of Dr. Floria Hurst. Last OV was 08/20/22. She has had her 3 attempts and no appts. Please advise.

## 2024-02-26 NOTE — Progress Notes (Signed)
 Chief Complaint: Patient was seen today for port site redness  Supervising Physician: Marland Silvas  Patient Status: [facility] - Out-pt  Subjective: Pt s/p removal of Rt internal jugular tunn CVC and placement of left port. Reports new onset swelling, redness, and pain around port site. No fevers, no drainage.  Objective: Physical Exam: BP (!) 158/71   Pulse 95   Temp 98.2 F (36.8 C) (Oral)   Resp 20   Ht 5\' 5"  (1.651 m)   Wt 166 lb (75.3 kg)   LMP  (LMP Unknown)   SpO2 100%   BMI 27.62 kg/m  (R)tunneled CVC removal site healing well. (L) port incision intact, Dermabond clean. Surround skin, mostly inferior to incision with focal swelling, erythema, and warmth. Tender but no fluctuance. Pt has marked edges with marker.   Current Outpatient Medications:    albuterol  (PROVENTIL ) (2.5 MG/3ML) 0.083% nebulizer solution, INHALE 3 ML BY NEBULIZATION EVERY 6 HOURS AS NEEDED FOR WHEEZING OR SHORTNESS OF BREATH, Disp: 225 mL, Rfl: 1   albuterol  (VENTOLIN  HFA) 108 (90 Base) MCG/ACT inhaler, Inhale 2 puffs into the lungs every 6 (six) hours as needed for wheezing or shortness of breath., Disp: 8 g, Rfl: 6   ALPRAZolam  (XANAX ) 1 MG tablet, 1 tablet daily and 1 tablet PRN no more than 10 days a month., Disp: 40 tablet, Rfl: 0   bisoprolol  (ZEBETA ) 5 MG tablet, TAKE 1/2 TABLET BY MOUTH DAILY, Disp: 7 tablet, Rfl: 0   bumetanide  (BUMEX ) 1 MG tablet, TAKE 1 TABLET BY MOUTH TWICE A DAY, Disp: 30 tablet, Rfl: 0   buPROPion  (WELLBUTRIN  XL) 150 MG 24 hr tablet, TAKE 1 TABLET BY MOUTH EVERY DAY, Disp: 90 tablet, Rfl: 0   clonazePAM  (KLONOPIN ) 1 MG tablet, Take 1 tablet (1 mg total) by mouth 2 (two) times daily., Disp: 60 tablet, Rfl: 2   dexlansoprazole  (DEXILANT ) 60 MG capsule, Take 1 capsule (60 mg total) by mouth daily., Disp: 90 capsule, Rfl: 3   diphenhydrAMINE  (BENADRYL ) 12.5 MG/5ML elixir, Take 10 mLs (25 mg total) by mouth every 6 (six) hours as needed (nausea)., Disp: 120 mL, Rfl: 0    famotidine  (PEPCID ) 40 MG/5ML suspension, Take 2.5 mLs (20 mg total) by mouth daily., Disp: 75 mL, Rfl: 0   famotidine  (PEPCID ) 40 MG/5ML suspension, TAKE 1.3 MLS (10.4 MG TOTAL) BY MOUTH DAILY - DISCARD BOTTLE AFTER 30 DAYS FROM PICKUP, Disp: 50 mL, Rfl: 0   FLORASTOR 250 MG capsule, Take 250 mg by mouth 2 (two) times daily. Mid Morning and Mid Afternoon, Disp: , Rfl:    fluconazole  (DIFLUCAN ) 200 MG tablet, Take 2 tablets (400 mg total) by mouth daily for 1 day, THEN 1 tablet (200 mg total) daily for 20 days., Disp: 22 tablet, Rfl: 0   FLUoxetine  (PROZAC ) 20 MG capsule, TAKE 1 CAPSULE BY MOUTH EVERY DAY, Disp: 90 capsule, Rfl: 0   fluticasone -salmeterol (ADVAIR HFA) 230-21 MCG/ACT inhaler, Inhale 2 puffs into the lungs 2 (two) times daily., Disp: 1 each, Rfl: 11   hydrocortisone  (CORTEF ) 10 MG tablet, Take 1-2 tablets (10-20 mg total) by mouth See admin instructions. Take 20 mg in the am and 10mg  in the evening. Take after completing prednisone  (Patient taking differently: Take 10-20 mg by mouth See admin instructions. Take 20 mg in the am at 10 am and 10 mg in the afternoon. Take after completing prednisone ), Disp: , Rfl:    lamoTRIgine  (LAMICTAL ) 200 MG tablet, TAKE 1 TABLET BY MOUTH EVERYDAY AT BEDTIME,  Disp: 30 tablet, Rfl: 0   lamoTRIgine  (LAMICTAL ) 25 MG tablet, TAKE TWO TABLETS EVERY MORNING., Disp: 60 tablet, Rfl: 0   Lancets (ONETOUCH DELICA PLUS LANCET33G) MISC, 3 (three) times daily. for testing, Disp: , Rfl:    lidocaine  (XYLOCAINE ) 2 % solution, Use as directed 10 mLs in the mouth or throat every 4 (four) hours as needed (for esophageal pain and pain with swallowing.)., Disp: 100 mL, Rfl: 1   OLANZapine  (ZYPREXA ) 5 MG tablet, TAKE 1 TABLET BY MOUTH EVERYDAY AT BEDTIME, Disp: 30 tablet, Rfl: 1   ONETOUCH VERIO test strip, 3 (three) times daily. for testing, Disp: , Rfl:    OXYGEN , Inhale 4-5 L/min into the lungs continuous., Disp: , Rfl:    PROAIR  HFA 108 (90 Base) MCG/ACT inhaler,  Inhale 2 puffs into the lungs every 4 (four) hours as needed for wheezing or shortness of breath., Disp: , Rfl:    QVAR  REDIHALER 80 MCG/ACT inhaler, Inhale 1 puff into the lungs 2 (two) times daily., Disp: , Rfl:    rosuvastatin  (CRESTOR ) 10 MG tablet, Take 10 mg by mouth in the morning. Mid morning, Disp: , Rfl:    sacubitril -valsartan  (ENTRESTO ) 24-26 MG, TAKE 1 TABLET BY MOUTH TWICE A DAY (Patient taking differently: Take 1 tablet by mouth daily.), Disp: 60 tablet, Rfl: 5   sucralfate  (CARAFATE ) 1 GM/10ML suspension, Take 1 g by mouth daily as needed (as directed for ulcers)., Disp: , Rfl:    SYNTHROID  100 MCG tablet, Take 1 tablet (100 mcg total) by mouth every morning. (Patient taking differently: Take 100 mcg by mouth every morning. * Must be name brand*), Disp: , Rfl:    Tiotropium Bromide  Monohydrate (SPIRIVA  RESPIMAT) 1.25 MCG/ACT AERS, Inhale 2 puffs into the lungs daily., Disp: 4 g, Rfl: 11   topiramate  (TOPAMAX ) 50 MG tablet, Take 150 mg by mouth at bedtime., Disp: , Rfl:    zolpidem  (AMBIEN  CR) 12.5 MG CR tablet, TAKE ONE TABLET BY MOUTH AT BEDTIME AS NEEDED FOR SLEEP., Disp: 90 tablet, Rfl: 0   amoxicillin -clavulanate (AUGMENTIN ) 875-125 MG tablet, Take 1 tablet by mouth 2 (two) times daily., Disp: 14 tablet, Rfl: 0   aspirin  EC 81 MG tablet, Take 1 tablet (81 mg total) by mouth daily. Swallow whole. (Patient taking differently: Take 81 mg by mouth at bedtime. Chewable), Disp: 90 tablet, Rfl: 3   Bismuth Tribromoph-Petrolatum (XEROFORM OCCLUSIVE GAUZE STRIP) PADS, Apply 1 each topically as directed., Disp: 50 each, Rfl: 1   cyclobenzaprine  (FLEXERIL ) 5 MG tablet, Take 1 tablet (5 mg total) by mouth 3 (three) times daily as needed for muscle spasms., Disp: 20 tablet, Rfl: 0   diclofenac  (VOLTAREN ) 50 MG EC tablet, Take 50 mg by mouth 2 (two) times daily. *as needed for migraine, Can repeat if needed*, Disp: , Rfl:    EMGALITY  120 MG/ML SOSY, Inject 120 mg into the skin every 28  (twenty-eight) days., Disp: , Rfl:    EPINEPHRINE  0.3 mg/0.3 mL IJ SOAJ injection, INJECT 0.3 MG INTO THE MUSCLE AS NEEDED FOR ANAPHYLAXIS., Disp: 2 each, Rfl: 1   Heparin  Na, Pork, Lock Flsh PF (BD HEPARIN  POSIFLUSH) 100 UNIT/ML SOLN, USE 5 MLS IN PORT A CATH ONCE DAILY AFTER MEDICATION ADMINISTRATION AS A HEPLOCK AS DIRECTED., Disp: 150 mL, Rfl: 3   hydrOXYzine  (ATARAX ) 25 MG tablet, Take 1 tablet (25 mg total) by mouth every 6 (six) hours as needed for anxiety., Disp: 30 tablet, Rfl: 0   hyoscyamine  (LEVSIN/SL) 0.125 MG SL tablet,  Place 1 tablet (0.125 mg total) under the tongue every 6 (six) hours as needed (esophageal spasm). Up to 1.25 mg daily, Disp: 30 tablet, Rfl: 1   lansoprazole (PREVACID) 15 MG capsule, Take 30 mg by mouth in the morning and at bedtime., Disp: , Rfl:    LORazepam  (ATIVAN ) 0.5 MG tablet, Take 1 tablet (0.5 mg total) by mouth 3 (three) times daily as needed for anxiety. (Patient taking differently: Place 0.5 mg into feeding tube 3 (three) times daily as needed for anxiety. [employer] For Nausea), Disp: 12 tablet, Rfl: 0   nitroGLYCERIN  (NITROSTAT ) 0.4 MG SL tablet, DISSOLVE 1 TAB UNDER THE TONGUE EVERY 5 MINUTES AS NEEDED FOR CHEST PAIN. MAX OF 3 DOSES, THEN 911., Disp: 75 tablet, Rfl: 0   NURTEC 75 MG TBDP, Take 1 tablet by mouth daily as needed., Disp: , Rfl:    pantoprazole  (PROTONIX ) 40 MG tablet, TAKE 1 TABLET BY MOUTH EVERY DAY, Disp: 90 tablet, Rfl: 1   RABEprazole (ACIPHEX) 20 MG tablet, Take 1 tablet (20 mg total) by mouth daily., Disp: 30 tablet, Rfl: 0   sodium chloride  flush (BD POSIFLUSH) 0.9 % SOLN injection, Use as directed for catheter maintenance, Disp: 600 mL, Rfl: 3   Sodium Chloride  Flush (NORMAL SALINE FLUSH) 0.9 % SOLN, Use as directed for central venous catheter maintenance, Disp: 600 mL, Rfl: 3   Sodium Chloride  Flush (NORMAL SALINE FLUSH) 0.9 % SOLN, Use as directed for catheter maintenance, Disp: 600 mL, Rfl: 3   Sodium Chloride  Flush (NORMAL SALINE  FLUSH) 0.9 % SOLN, Use 10 mLs as directed for catheter maintenance., Disp: 600 mL, Rfl: 3   Sodium Chloride  Flush (NORMAL SALINE FLUSH) 0.9 % SOLN, Use as directed for catheter maintenance., Disp: 600 mL, Rfl: 3   zonisamide (ZONEGRAN) 100 MG capsule, Take 100 mg by mouth daily., Disp: , Rfl:   Labs: CBC No results for input(s): "WBC", "HGB", "HCT", "PLT" in the last 72 hours. BMET No results for input(s): "NA", "K", "CL", "CO2", "GLUCOSE", "BUN", "CREATININE", "CALCIUM " in the last 72 hours. LFT No results for input(s): "PROT", "ALBUMIN", "AST", "ALT", "ALKPHOS", "BILITOT", "BILIDIR", "IBILI", "LIPASE" in the last 72 hours. PT/INR No results for input(s): "LABPROT", "INR" in the last 72 hours.   Studies/Results: IR Removal Tun Cv Cath W/O FL Result Date: 02/25/2024 INDICATION: 53 year old with multiple medical problems and requires frequent IV access. Patient has poor venous access. Patient has a tunneled right jugular central venous catheter that is not functioning well. Patient presents for removal of this catheter and placement of a subcutaneous port. EXAM: FLUOROSCOPIC AND ULTRASOUND GUIDED PLACEMENT OF A SUBCUTANEOUS PORT REMOVAL OF RIGHT JUGULAR TUNNELED CENTRAL VENOUS CATHETER Physician: Olive Better. Henn, MD MEDICATIONS: Benadryl  50 mg ANESTHESIA/SEDATION: Moderate (conscious) sedation was employed during this procedure. A total of Versed  4 mg and fentanyl  200 mcg was administered intravenously at the order of the provider performing the procedure. Total intra-service moderate sedation time: 40 minutes. Patient's level of consciousness and vital signs were monitored continuously by radiology nurse throughout the procedure under the supervision of the provider performing the procedure. FLUOROSCOPY: Radiation Exposure Index (as provided by the fluoroscopic device): 4 mGy Kerma COMPLICATIONS: None immediate. PROCEDURE: The risks of the procedure were explained to the patient. Informed consent was  obtained. Patient was placed supine on the interventional table. Ultrasound confirmed a patent left internal jugular vein. The left chest and neck were cleaned with a skin antiseptic and a sterile drape was placed. Maximal barrier sterile  technique was utilized including caps, mask, sterile gowns, sterile gloves, sterile drape, hand hygiene and skin antiseptic. The left neck was anesthetized with 1% lidocaine . Small incision was made in the left neck with a blade. Micropuncture set was placed in the left internal jugular vein with ultrasound guidance. The left chest was anesthetized with 1% lidocaine  with epinephrine . #15 blade was used to make an incision and a subcutaneous port pocket was formed. 8 french [employer] was selected. Subcutaneous tunnel was formed with a stiff tunneling device. The port catheter was brought through the subcutaneous tunnel. The port was placed in the subcutaneous pocket. The micropuncture set was exchanged for a peel-away sheath. The catheter was placed through the peel-away sheath and the tip was positioned at the superior cavoatrial junction. Catheter placement was confirmed with fluoroscopy. The external portion of the port catheter was cut to an appropriate length and attached to the port device. Port was placed within the subcutaneous pocket. Port was accessed and found to aspirate and flush well. Port was flushed with heparinized saline. The port pocket was closed using two layers of absorbable sutures and Dermabond. The vein skin site was closed using a single layer of absorbable suture and Dermabond. Sterile dressings were applied. Patient ultrasound and fluoroscopic images were taken and saved for this procedure. Attention was directed to the right jugular tunneled central venous catheter. The right chest and tunneled central venous catheter were prepped and draped in sterile fashion. Maximal barrier sterile technique was utilized including caps, mask, sterile gowns, sterile  gloves, sterile drape, hand hygiene and skin antiseptic. Skin around the catheter exit site was anesthetized with 1% lidocaine . The catheter cuff was exposed using blunt dissection. This catheter was completely removed. Fluoroscopic image confirmed removal of the catheter and no change in the position of the Port-A-Cath. FINDINGS: Left jugular Port-A-Cath tip at the superior cavoatrial junction. IMPRESSION: 1. Placement of a left jugular power injectable port device. Catheter tip at the superior cavoatrial junction. 2. Removal of the right jugular tunneled central venous catheter. Electronically Signed   By: Elene Griffes M.D.   On: 02/25/2024 20:11   IR IMAGING GUIDED PORT INSERTION Result Date: 02/25/2024 INDICATION: 53 year old with multiple medical problems and requires frequent IV access. Patient has poor venous access. Patient has a tunneled right jugular central venous catheter that is not functioning well. Patient presents for removal of this catheter and placement of a subcutaneous port. EXAM: FLUOROSCOPIC AND ULTRASOUND GUIDED PLACEMENT OF A SUBCUTANEOUS PORT REMOVAL OF RIGHT JUGULAR TUNNELED CENTRAL VENOUS CATHETER Physician: Olive Better. Henn, MD MEDICATIONS: Benadryl  50 mg ANESTHESIA/SEDATION: Moderate (conscious) sedation was employed during this procedure. A total of Versed  4 mg and fentanyl  200 mcg was administered intravenously at the order of the provider performing the procedure. Total intra-service moderate sedation time: 40 minutes. Patient's level of consciousness and vital signs were monitored continuously by radiology nurse throughout the procedure under the supervision of the provider performing the procedure. FLUOROSCOPY: Radiation Exposure Index (as provided by the fluoroscopic device): 4 mGy Kerma COMPLICATIONS: None immediate. PROCEDURE: The risks of the procedure were explained to the patient. Informed consent was obtained. Patient was placed supine on the interventional table. Ultrasound  confirmed a patent left internal jugular vein. The left chest and neck were cleaned with a skin antiseptic and a sterile drape was placed. Maximal barrier sterile technique was utilized including caps, mask, sterile gowns, sterile gloves, sterile drape, hand hygiene and skin antiseptic. The left neck was anesthetized with 1% lidocaine .  Small incision was made in the left neck with a blade. Micropuncture set was placed in the left internal jugular vein with ultrasound guidance. The left chest was anesthetized with 1% lidocaine  with epinephrine . #15 blade was used to make an incision and a subcutaneous port pocket was formed. 8 french [employer] was selected. Subcutaneous tunnel was formed with a stiff tunneling device. The port catheter was brought through the subcutaneous tunnel. The port was placed in the subcutaneous pocket. The micropuncture set was exchanged for a peel-away sheath. The catheter was placed through the peel-away sheath and the tip was positioned at the superior cavoatrial junction. Catheter placement was confirmed with fluoroscopy. The external portion of the port catheter was cut to an appropriate length and attached to the port device. Port was placed within the subcutaneous pocket. Port was accessed and found to aspirate and flush well. Port was flushed with heparinized saline. The port pocket was closed using two layers of absorbable sutures and Dermabond. The vein skin site was closed using a single layer of absorbable suture and Dermabond. Sterile dressings were applied. Patient ultrasound and fluoroscopic images were taken and saved for this procedure. Attention was directed to the right jugular tunneled central venous catheter. The right chest and tunneled central venous catheter were prepped and draped in sterile fashion. Maximal barrier sterile technique was utilized including caps, mask, sterile gowns, sterile gloves, sterile drape, hand hygiene and skin antiseptic. Skin around the  catheter exit site was anesthetized with 1% lidocaine . The catheter cuff was exposed using blunt dissection. This catheter was completely removed. Fluoroscopic image confirmed removal of the catheter and no change in the position of the Port-A-Cath. FINDINGS: Left jugular Port-A-Cath tip at the superior cavoatrial junction. IMPRESSION: 1. Placement of a left jugular power injectable port device. Catheter tip at the superior cavoatrial junction. 2. Removal of the right jugular tunneled central venous catheter. Electronically Signed   By: Elene Griffes M.D.   On: 02/25/2024 20:11    Assessment/Plan: S/p (L)port yesterday. POD #1 swelling, erythema, and pain concern for ecchymosis vs evolving cellulitis. Recommend alternating cool/warm compresses. Augmentin  po sent to pt pharmacy. Will follow up on Fri 5/30 unless increased fevers redness, drainage. Discussed that port removal may still be required.   Prudence Brown PA-C 02/26/2024 3:02 PM

## 2024-02-26 NOTE — Telephone Encounter (Signed)
 Returned call to patient. Patient reports that she had a new port placed yesterday and she has noticed redness & swelling at the site. Patient reports that the area is a different temp, size of red area has increased. Patient is scared to go to ED due to being immunocompromised. I advised patient to go by IR to see if a nurse or physician can check the site today. I advised that I did not want her to come here and the physician defer to IR regardless. Patient was crying on the phone, she is very overwhelmed at this time. I apologized to patient since there was not much we can do from a GI standpoint. Pt was very understanding.

## 2024-02-26 NOTE — Telephone Encounter (Signed)
 Patient requesting to speak with a nurse in regards to swelling and redness. Please advise.

## 2024-02-27 ENCOUNTER — Telehealth (INDEPENDENT_AMBULATORY_CARE_PROVIDER_SITE_OTHER): Admitting: Adult Health

## 2024-02-27 ENCOUNTER — Encounter: Payer: Self-pay | Admitting: Radiology

## 2024-02-27 ENCOUNTER — Other Ambulatory Visit: Payer: Self-pay

## 2024-02-27 ENCOUNTER — Other Ambulatory Visit: Payer: Self-pay | Admitting: Radiology

## 2024-02-27 ENCOUNTER — Ambulatory Visit (HOSPITAL_COMMUNITY)
Admission: RE | Admit: 2024-02-27 | Discharge: 2024-02-27 | Disposition: A | Discharge status: Home / Self Care | Source: Ambulatory Visit | Attending: Radiology | Admitting: Radiology

## 2024-02-27 ENCOUNTER — Encounter: Payer: Self-pay | Admitting: Adult Health

## 2024-02-27 ENCOUNTER — Ambulatory Visit (INDEPENDENT_AMBULATORY_CARE_PROVIDER_SITE_OTHER): Admitting: Psychiatry

## 2024-02-27 DIAGNOSIS — G4700 Insomnia, unspecified: Secondary | ICD-10-CM

## 2024-02-27 DIAGNOSIS — F410 Panic disorder [episodic paroxysmal anxiety] without agoraphobia: Secondary | ICD-10-CM | POA: Diagnosis not present

## 2024-02-27 DIAGNOSIS — T8149XA Infection following a procedure, other surgical site, initial encounter: Secondary | ICD-10-CM | POA: Diagnosis not present

## 2024-02-27 DIAGNOSIS — T82598A Other mechanical complication of other cardiac and vascular devices and implants, initial encounter: Secondary | ICD-10-CM | POA: Diagnosis not present

## 2024-02-27 DIAGNOSIS — Y839 Surgical procedure, unspecified as the cause of abnormal reaction of the patient, or of later complication, without mention of misadventure at the time of the procedure: Secondary | ICD-10-CM | POA: Insufficient documentation

## 2024-02-27 DIAGNOSIS — F331 Major depressive disorder, recurrent, moderate: Secondary | ICD-10-CM | POA: Diagnosis not present

## 2024-02-27 DIAGNOSIS — I878 Other specified disorders of veins: Secondary | ICD-10-CM

## 2024-02-27 DIAGNOSIS — F411 Generalized anxiety disorder: Secondary | ICD-10-CM

## 2024-02-27 DIAGNOSIS — Z452 Encounter for adjustment and management of vascular access device: Secondary | ICD-10-CM | POA: Diagnosis not present

## 2024-02-27 DIAGNOSIS — E271 Primary adrenocortical insufficiency: Secondary | ICD-10-CM | POA: Diagnosis not present

## 2024-02-27 HISTORY — PX: IR REMOVAL TUN ACCESS W/ PORT W/O FL MOD SED: IMG2290

## 2024-02-27 MED ORDER — ALPRAZOLAM 1 MG PO TABS: ORAL_TABLET | ORAL | 2 refills | Status: DC

## 2024-02-27 MED ORDER — MIDAZOLAM HCL 2 MG/2ML IJ SOLN
INTRAMUSCULAR | Status: AC
Start: 2024-02-27 — End: ?
  Filled 2024-02-27: qty 2

## 2024-02-27 MED ORDER — DIPHENHYDRAMINE HCL 50 MG/ML IJ SOLN
INTRAMUSCULAR | Status: AC
  Filled 2024-02-27: qty 1

## 2024-02-27 MED ORDER — FENTANYL CITRATE (PF) 100 MCG/2ML IJ SOLN
INTRAMUSCULAR | Status: AC | PRN
  Administered 2024-02-27 (×2): 50 ug via INTRAVENOUS
  Administered 2024-02-27 (×2): 25 ug via INTRAVENOUS

## 2024-02-27 MED ORDER — HEPARIN SOD (PORK) LOCK FLUSH 100 UNIT/ML IV SOLN
INTRAVENOUS | Status: AC
  Filled 2024-02-27: qty 5

## 2024-02-27 MED ORDER — CLONAZEPAM 1 MG PO TABS: 1.0000 mg | ORAL_TABLET | Freq: Two times a day (BID) | ORAL | 2 refills | Status: DC

## 2024-02-27 MED ORDER — OLANZAPINE 5 MG PO TABS: 5.0000 mg | ORAL_TABLET | Freq: Every day | ORAL | 2 refills | Status: DC

## 2024-02-27 MED ORDER — LAMOTRIGINE 25 MG PO TABS: ORAL_TABLET | ORAL | 2 refills | Status: DC

## 2024-02-27 MED ORDER — LIDOCAINE-EPINEPHRINE 1 %-1:100000 IJ SOLN
20.0000 mL | Freq: Once | INTRAMUSCULAR | Status: AC
  Administered 2024-02-27: 20 mL via INTRADERMAL

## 2024-02-27 MED ORDER — FENTANYL CITRATE (PF) 100 MCG/2ML IJ SOLN
INTRAMUSCULAR | Status: AC
  Filled 2024-02-27: qty 2

## 2024-02-27 MED ORDER — DIPHENHYDRAMINE HCL 50 MG/ML IJ SOLN
INTRAMUSCULAR | Status: AC | PRN
  Administered 2024-02-27: 50 mg via INTRAVENOUS

## 2024-02-27 MED ORDER — MIDAZOLAM HCL 2 MG/2ML IJ SOLN
INTRAMUSCULAR | Status: AC | PRN
  Administered 2024-02-27: .5 mg via INTRAVENOUS
  Administered 2024-02-27 (×2): 1 mg via INTRAVENOUS
  Administered 2024-02-27: .5 mg via INTRAVENOUS

## 2024-02-27 MED ORDER — LAMOTRIGINE 200 MG PO TABS: ORAL_TABLET | ORAL | 2 refills | Status: DC

## 2024-02-27 MED ORDER — MIDAZOLAM HCL 2 MG/2ML IJ SOLN
INTRAMUSCULAR | Status: AC
  Filled 2024-02-27: qty 2

## 2024-02-27 MED ORDER — LIDOCAINE-EPINEPHRINE 1 %-1:100000 IJ SOLN
INTRAMUSCULAR | Status: AC
  Filled 2024-02-27: qty 1

## 2024-02-27 MED ORDER — FLUOXETINE HCL 20 MG PO CAPS: 20.0000 mg | ORAL_CAPSULE | Freq: Every day | ORAL | 0 refills | Status: DC

## 2024-02-27 NOTE — Progress Notes (Signed)
 [facility] Counselor/Therapist Progress Note  Patient ID: [name], MRN: [id number],    Date: 02/27/2024  Time Spent: 50 minutes   Treatment Type: Individual Therapy  Virtual Visit via Telehealth Note: MyChart Video session Connected with patient by a telemedicine/telehealth application, with their informed consent, and verified patient privacy and that I am speaking with the correct person using two identifiers. I discussed the limitations, risks, security and privacy concerns of performing psychotherapy and the availability of in person appointments. I also discussed with the patient that there may be a patient responsible charge related to this service. The patient expressed understanding and agreed to proceed. I discussed the treatment planning with the patient. The patient was provided an opportunity to ask questions and all were answered. The patient agreed with the plan and demonstrated an understanding of the instructions. The patient was advised to call  our office if  symptoms worsen or feel they are in a crisis state and need immediate contact.   Therapist Location: office Patient Location: home   Reported Symptoms:  anxiety, anger, hate, fear, frustrations with medical staff that she interacts with"    Mental Status Exam:  Appearance:   Casual     Behavior:  Sharing, Agitated, and did eventually become calmer  Motor:  Affected by her cancer  Speech/Language:   Clear and Coherent  Affect:  Depressed and Tearful  Mood:  angry, anxious, depressed, and irritable  Thought process:  goal directed  Thought content:    Rumination  Sensory/Perceptual disturbances:    WNL  Orientation:  oriented to person, place, time/date, situation, day of week, month of year, year, and stated date of Feb 27, 2024  Attention:  Fair  Concentration:  Fair  Memory:  "Some spells of not remembering"  Fund of knowledge:   Fair  Insight:    Fair  Judgment:   Good and Fair  Impulse  Control:  Good and Fair   Risk Assessment: Danger to Self:  No Self-injurious Behavior: No Danger to Others: No Duty to Warn:no Physical Aggression / Violence:No  Access to Firearms a concern: No  Gang Involvement:No   Subjective:  Patient reporting symptoms more recently to be: Anxiety, anger, hate, depression, "frustration with medical people". Talked through these symptoms today particularly her "hate and anger" which helped her to feel heard and eventually got to a place of talking more calmly about her health challenges and the frustrations that she encounters. States that doctors have told her "they have done all they can do to help her." So far, is still able to get out some and intends to go on upcoming family trips. Much more calm and rational than earlier in session. Talking clearly and actively involved in session, and actually laughing at times as she shared some family happenings that had occurred earlier. Really looking forward to the upcoming trip June 12th-26th to [place] with her husband.  Is hoping for a good trip and is looking forward to it.  Mood has remained much more calm and this second half of her telehealth video session.  Expresses no thoughts to harm self or anyone else.  States that she is not feeling as stuck now and her anger and her fears so venting earlier in her session served a good purpose for her.  Is scheduled to be seen again after she returns from her trip with her husband.  Interventions: Cognitive Behavioral Therapy and Ego-Supportive Long-term goal:  Patient will confront her  anxiety, fears, anger reported re: her illness and use time in therapy and beyond to work on this, while receiving feedback and support from therapist. Help her determine what she feels is realistic for her considering her health challenges and difficult prognosis.  Short-term goal: Participate in therapy and initiate communication of needs and desires at this point in her struggle  physically.  Strategies: Share and talk through anxiety, anger, and depression that are experienced and work to find and use helpful coping tools to better manage those feelings.   Diagnosis:   ICD-10-CM   1. Major depressive disorder, recurrent episode, moderate (HCC)  F33.1      Plan: Patient today in session working further on her anxiety, anger, hate, frustration, fears, and depression. Addressed her anger and hate first and she did vent freely and talk through a lot of of these 2 emotions, eventually reaching a place where she could talk more rationally and seemed to be very helpful for patient both working through the anger and hate and also being able to move forward to talk further about her fears, anxieties, and some family relationship issues.  Was calm at end of session and seemed to be more stable.  Active participation throughout session and became calmer and was calm the rest of session involved in some meaningful conversation regarding the path her life has taken. Neyla Gauntt has made progress and continues working with goal-directed behaviors to keep moving in a direction to manage the challenges of her health condition, her overall emotional health, and to make better use of the time she has in positive ways that feel helpful to her.  Goal review and progress/challenges noted with patient.  Next appointment within 3 weeks.   Kelleen Patee, LCSW

## 2024-02-27 NOTE — H&P (Signed)
 Chief Complaint: Patient was seen today for port site redness  Supervising Physician: Marland Silvas  Patient Status: [name] - Out-pt  Subjective: Pt s/p removal of Rt internal jugular tunn CVC and placement of left port. Reports new onset swelling, redness, and pain around port site. Now with spontaneous pus drainage from site as well as increased erythema as well as fevers. Returns for re-eval.  Objective: Physical Exam: BP 112/72   Pulse (!) 110   Temp 100.3 F (37.9 C) (Oral)   Resp (!) 22   Ht 5\' 5"  (1.651 m)   Wt 165 lb (74.8 kg)   LMP  (LMP Unknown)   SpO2 97%   BMI 27.46 kg/m  (R)tunneled CVC removal site healing well. (L) port incision intact, Dermabond clean. Crusting from drained purulence noted. Erythema margins wider.    Current Outpatient Medications:    albuterol  (VENTOLIN  HFA) 108 (90 Base) MCG/ACT inhaler, Inhale 2 puffs into the lungs every 6 (six) hours as needed for wheezing or shortness of breath., Disp: 8 g, Rfl: 6   ALPRAZolam  (XANAX ) 1 MG tablet, 1 tablet daily and 1 tablet PRN no more than 10 days a month., Disp: 40 tablet, Rfl: 2   amoxicillin -clavulanate (AUGMENTIN ) 875-125 MG tablet, Take 1 tablet by mouth 2 (two) times daily., Disp: 14 tablet, Rfl: 0   aspirin  EC 81 MG tablet, Take 1 tablet (81 mg total) by mouth daily. Swallow whole. (Patient taking differently: Take 81 mg by mouth at bedtime. Chewable), Disp: 90 tablet, Rfl: 3   bisoprolol  (ZEBETA ) 5 MG tablet, TAKE 1/2 TABLET BY MOUTH DAILY, Disp: 7 tablet, Rfl: 0   bumetanide  (BUMEX ) 1 MG tablet, TAKE 1 TABLET BY MOUTH TWICE A DAY, Disp: 30 tablet, Rfl: 0   buPROPion  (WELLBUTRIN  XL) 150 MG 24 hr tablet, TAKE 1 TABLET BY MOUTH EVERY DAY, Disp: 90 tablet, Rfl: 0   clonazePAM  (KLONOPIN ) 1 MG tablet, Take 1 tablet (1 mg total) by mouth 2 (two) times daily., Disp: 60 tablet, Rfl: 2   dexlansoprazole  (DEXILANT ) 60 MG capsule, Take 1 capsule (60 mg total) by mouth daily., Disp: 90 capsule, Rfl:  3   diphenhydrAMINE  (BENADRYL ) 12.5 MG/5ML elixir, Take 10 mLs (25 mg total) by mouth every 6 (six) hours as needed (nausea)., Disp: 120 mL, Rfl: 0   famotidine  (PEPCID ) 40 MG/5ML suspension, TAKE 1.3 MLS (10.4 MG TOTAL) BY MOUTH DAILY - DISCARD BOTTLE AFTER 30 DAYS FROM PICKUP, Disp: 50 mL, Rfl: 0   FLORASTOR 250 MG capsule, Take 250 mg by mouth 2 (two) times daily. Mid Morning and Mid Afternoon, Disp: , Rfl:    FLUoxetine  (PROZAC ) 20 MG capsule, Take 1 capsule (20 mg total) by mouth daily., Disp: 90 capsule, Rfl: 0   hydrocortisone  (CORTEF ) 10 MG tablet, Take 1-2 tablets (10-20 mg total) by mouth See admin instructions. Take 20 mg in the am and 10mg  in the evening. Take after completing prednisone  (Patient taking differently: Take 10-20 mg by mouth See admin instructions. Take 20 mg in the am at 10 am and 10 mg in the afternoon. Take after completing prednisone ), Disp: , Rfl:    hyoscyamine  (LEVSIN/SL) 0.125 MG SL tablet, Place 1 tablet (0.125 mg total) under the tongue every 6 (six) hours as needed (esophageal spasm). Up to 1.25 mg daily, Disp: 30 tablet, Rfl: 1   lamoTRIgine  (LAMICTAL ) 200 MG tablet, TAKE 1 TABLET BY MOUTH EVERYDAY AT BEDTIME, Disp: 30 tablet, Rfl: 2   lamoTRIgine  (LAMICTAL ) 25 MG tablet, Take  two tablets every morning., Disp: 60 tablet, Rfl: 2   OLANZapine  (ZYPREXA ) 5 MG tablet, Take 1 tablet (5 mg total) by mouth at bedtime., Disp: 30 tablet, Rfl: 2   PROAIR  HFA 108 (90 Base) MCG/ACT inhaler, Inhale 2 puffs into the lungs every 4 (four) hours as needed for wheezing or shortness of breath., Disp: , Rfl:    QVAR  REDIHALER 80 MCG/ACT inhaler, Inhale 1 puff into the lungs 2 (two) times daily., Disp: , Rfl:    rosuvastatin  (CRESTOR ) 10 MG tablet, Take 10 mg by mouth in the morning. Mid morning, Disp: , Rfl:    sacubitril -valsartan  (ENTRESTO ) 24-26 MG, TAKE 1 TABLET BY MOUTH TWICE A DAY (Patient taking differently: Take 1 tablet by mouth daily.), Disp: 60 tablet, Rfl: 5   SYNTHROID  100  MCG tablet, Take 1 tablet (100 mcg total) by mouth every morning. (Patient taking differently: Take 100 mcg by mouth every morning. * Must be name brand*), Disp: , Rfl:    Tiotropium Bromide  Monohydrate (SPIRIVA  RESPIMAT) 1.25 MCG/ACT AERS, Inhale 2 puffs into the lungs daily., Disp: 4 g, Rfl: 11   topiramate  (TOPAMAX ) 50 MG tablet, Take 150 mg by mouth at bedtime., Disp: , Rfl:    zolpidem  (AMBIEN  CR) 12.5 MG CR tablet, TAKE ONE TABLET BY MOUTH AT BEDTIME AS NEEDED FOR SLEEP., Disp: 90 tablet, Rfl: 0   albuterol  (PROVENTIL ) (2.5 MG/3ML) 0.083% nebulizer solution, INHALE 3 ML BY NEBULIZATION EVERY 6 HOURS AS NEEDED FOR WHEEZING OR SHORTNESS OF BREATH, Disp: 225 mL, Rfl: 1   Bismuth Tribromoph-Petrolatum (XEROFORM OCCLUSIVE GAUZE STRIP) PADS, Apply 1 each topically as directed., Disp: 50 each, Rfl: 1   cyclobenzaprine  (FLEXERIL ) 5 MG tablet, Take 1 tablet (5 mg total) by mouth 3 (three) times daily as needed for muscle spasms., Disp: 20 tablet, Rfl: 0   diclofenac  (VOLTAREN ) 50 MG EC tablet, Take 50 mg by mouth 2 (two) times daily. *as needed for migraine, Can repeat if needed*, Disp: , Rfl:    EMGALITY  120 MG/ML SOSY, Inject 120 mg into the skin every 28 (twenty-eight) days., Disp: , Rfl:    EPINEPHRINE  0.3 mg/0.3 mL IJ SOAJ injection, INJECT 0.3 MG INTO THE MUSCLE AS NEEDED FOR ANAPHYLAXIS., Disp: 2 each, Rfl: 1   famotidine  (PEPCID ) 40 MG/5ML suspension, Take 2.5 mLs (20 mg total) by mouth daily., Disp: 75 mL, Rfl: 0   fluconazole  (DIFLUCAN ) 200 MG tablet, Take 2 tablets (400 mg total) by mouth daily for 1 day, THEN 1 tablet (200 mg total) daily for 20 days., Disp: 22 tablet, Rfl: 0   fluticasone -salmeterol (ADVAIR HFA) 230-21 MCG/ACT inhaler, Inhale 2 puffs into the lungs 2 (two) times daily., Disp: 1 each, Rfl: 11   Heparin  Na, Pork, Lock Flsh PF (BD HEPARIN  POSIFLUSH) 100 UNIT/ML SOLN, USE 5 MLS IN PORT A CATH ONCE DAILY AFTER MEDICATION ADMINISTRATION AS A HEPLOCK AS DIRECTED., Disp: 150 mL, Rfl:  3   hydrOXYzine  (ATARAX ) 25 MG tablet, Take 1 tablet (25 mg total) by mouth every 6 (six) hours as needed for anxiety., Disp: 30 tablet, Rfl: 0   Lancets (ONETOUCH DELICA PLUS LANCET33G) MISC, 3 (three) times daily. for testing, Disp: , Rfl:    lansoprazole (PREVACID) 15 MG capsule, Take 30 mg by mouth in the morning and at bedtime., Disp: , Rfl:    lidocaine  (XYLOCAINE ) 2 % solution, Use as directed 10 mLs in the mouth or throat every 4 (four) hours as needed (for esophageal pain and pain with swallowing.)., Disp: 100  mL, Rfl: 1   LORazepam  (ATIVAN ) 0.5 MG tablet, Take 1 tablet (0.5 mg total) by mouth 3 (three) times daily as needed for anxiety. (Patient taking differently: Place 0.5 mg into feeding tube 3 (three) times daily as needed for anxiety. [employer] For Nausea), Disp: 12 tablet, Rfl: 0   nitroGLYCERIN  (NITROSTAT ) 0.4 MG SL tablet, DISSOLVE 1 TAB UNDER THE TONGUE EVERY 5 MINUTES AS NEEDED FOR CHEST PAIN. MAX OF 3 DOSES, THEN 911., Disp: 75 tablet, Rfl: 0   NURTEC 75 MG TBDP, Take 1 tablet by mouth daily as needed., Disp: , Rfl:    ONETOUCH VERIO test strip, 3 (three) times daily. for testing, Disp: , Rfl:    OXYGEN , Inhale 4-5 L/min into the lungs continuous., Disp: , Rfl:    pantoprazole  (PROTONIX ) 40 MG tablet, TAKE 1 TABLET BY MOUTH EVERY DAY, Disp: 90 tablet, Rfl: 1   RABEprazole (ACIPHEX) 20 MG tablet, Take 1 tablet (20 mg total) by mouth daily., Disp: 30 tablet, Rfl: 0   sodium chloride  flush (BD POSIFLUSH) 0.9 % SOLN injection, Use as directed for catheter maintenance, Disp: 600 mL, Rfl: 3   Sodium Chloride  Flush (NORMAL SALINE FLUSH) 0.9 % SOLN, Use as directed for central venous catheter maintenance, Disp: 600 mL, Rfl: 3   Sodium Chloride  Flush (NORMAL SALINE FLUSH) 0.9 % SOLN, Use as directed for catheter maintenance, Disp: 600 mL, Rfl: 3   Sodium Chloride  Flush (NORMAL SALINE FLUSH) 0.9 % SOLN, Use 10 mLs as directed for catheter maintenance., Disp: 600 mL, Rfl: 3   Sodium  Chloride Flush (NORMAL SALINE FLUSH) 0.9 % SOLN, Use as directed for catheter maintenance., Disp: 600 mL, Rfl: 3   sucralfate  (CARAFATE ) 1 GM/10ML suspension, Take 1 g by mouth daily as needed (as directed for ulcers)., Disp: , Rfl:    zonisamide (ZONEGRAN) 100 MG capsule, Take 100 mg by mouth daily., Disp: , Rfl:      Studies/Results: IR Removal Tun Cv Cath W/O FL Result Date: 02/25/2024 INDICATION: 53 year old with multiple medical problems and requires frequent IV access. Patient has poor venous access. Patient has a tunneled right jugular central venous catheter that is not functioning well. Patient presents for removal of this catheter and placement of a subcutaneous port. EXAM: FLUOROSCOPIC AND ULTRASOUND GUIDED PLACEMENT OF A SUBCUTANEOUS PORT REMOVAL OF RIGHT JUGULAR TUNNELED CENTRAL VENOUS CATHETER Physician: Olive Better. Henn, MD MEDICATIONS: Benadryl  50 mg ANESTHESIA/SEDATION: Moderate (conscious) sedation was employed during this procedure. A total of Versed  4 mg and fentanyl  200 mcg was administered intravenously at the order of the provider performing the procedure. Total intra-service moderate sedation time: 40 minutes. Patient's level of consciousness and vital signs were monitored continuously by radiology nurse throughout the procedure under the supervision of the provider performing the procedure. FLUOROSCOPY: Radiation Exposure Index (as provided by the fluoroscopic device): 4 mGy Kerma COMPLICATIONS: None immediate. PROCEDURE: The risks of the procedure were explained to the patient. Informed consent was obtained. Patient was placed supine on the interventional table. Ultrasound confirmed a patent left internal jugular vein. The left chest and neck were cleaned with a skin antiseptic and a sterile drape was placed. Maximal barrier sterile technique was utilized including caps, mask, sterile gowns, sterile gloves, sterile drape, hand hygiene and skin antiseptic. The left neck was anesthetized  with 1% lidocaine . Small incision was made in the left neck with a blade. Micropuncture set was placed in the left internal jugular vein with ultrasound guidance. The left chest was anesthetized with 1%  lidocaine  with epinephrine . #15 blade was used to make an incision and a subcutaneous port pocket was formed. 8 french [employer] was selected. Subcutaneous tunnel was formed with a stiff tunneling device. The port catheter was brought through the subcutaneous tunnel. The port was placed in the subcutaneous pocket. The micropuncture set was exchanged for a peel-away sheath. The catheter was placed through the peel-away sheath and the tip was positioned at the superior cavoatrial junction. Catheter placement was confirmed with fluoroscopy. The external portion of the port catheter was cut to an appropriate length and attached to the port device. Port was placed within the subcutaneous pocket. Port was accessed and found to aspirate and flush well. Port was flushed with heparinized saline. The port pocket was closed using two layers of absorbable sutures and Dermabond. The vein skin site was closed using a single layer of absorbable suture and Dermabond. Sterile dressings were applied. Patient ultrasound and fluoroscopic images were taken and saved for this procedure. Attention was directed to the right jugular tunneled central venous catheter. The right chest and tunneled central venous catheter were prepped and draped in sterile fashion. Maximal barrier sterile technique was utilized including caps, mask, sterile gowns, sterile gloves, sterile drape, hand hygiene and skin antiseptic. Skin around the catheter exit site was anesthetized with 1% lidocaine . The catheter cuff was exposed using blunt dissection. This catheter was completely removed. Fluoroscopic image confirmed removal of the catheter and no change in the position of the Port-A-Cath. FINDINGS: Left jugular Port-A-Cath tip at the superior cavoatrial  junction. IMPRESSION: 1. Placement of a left jugular power injectable port device. Catheter tip at the superior cavoatrial junction. 2. Removal of the right jugular tunneled central venous catheter. Electronically Signed   By: Elene Griffes M.D.   On: 02/25/2024 20:11   IR IMAGING GUIDED PORT INSERTION Result Date: 02/25/2024 INDICATION: 53 year old with multiple medical problems and requires frequent IV access. Patient has poor venous access. Patient has a tunneled right jugular central venous catheter that is not functioning well. Patient presents for removal of this catheter and placement of a subcutaneous port. EXAM: FLUOROSCOPIC AND ULTRASOUND GUIDED PLACEMENT OF A SUBCUTANEOUS PORT REMOVAL OF RIGHT JUGULAR TUNNELED CENTRAL VENOUS CATHETER Physician: Olive Better. Henn, MD MEDICATIONS: Benadryl  50 mg ANESTHESIA/SEDATION: Moderate (conscious) sedation was employed during this procedure. A total of Versed  4 mg and fentanyl  200 mcg was administered intravenously at the order of the provider performing the procedure. Total intra-service moderate sedation time: 40 minutes. Patient's level of consciousness and vital signs were monitored continuously by radiology nurse throughout the procedure under the supervision of the provider performing the procedure. FLUOROSCOPY: Radiation Exposure Index (as provided by the fluoroscopic device): 4 mGy Kerma COMPLICATIONS: None immediate. PROCEDURE: The risks of the procedure were explained to the patient. Informed consent was obtained. Patient was placed supine on the interventional table. Ultrasound confirmed a patent left internal jugular vein. The left chest and neck were cleaned with a skin antiseptic and a sterile drape was placed. Maximal barrier sterile technique was utilized including caps, mask, sterile gowns, sterile gloves, sterile drape, hand hygiene and skin antiseptic. The left neck was anesthetized with 1% lidocaine . Small incision was made in the left neck with a  blade. Micropuncture set was placed in the left internal jugular vein with ultrasound guidance. The left chest was anesthetized with 1% lidocaine  with epinephrine . #15 blade was used to make an incision and a subcutaneous port pocket was formed. 8 french [employer] was selected. Subcutaneous  tunnel was formed with a stiff tunneling device. The port catheter was brought through the subcutaneous tunnel. The port was placed in the subcutaneous pocket. The micropuncture set was exchanged for a peel-away sheath. The catheter was placed through the peel-away sheath and the tip was positioned at the superior cavoatrial junction. Catheter placement was confirmed with fluoroscopy. The external portion of the port catheter was cut to an appropriate length and attached to the port device. Port was placed within the subcutaneous pocket. Port was accessed and found to aspirate and flush well. Port was flushed with heparinized saline. The port pocket was closed using two layers of absorbable sutures and Dermabond. The vein skin site was closed using a single layer of absorbable suture and Dermabond. Sterile dressings were applied. Patient ultrasound and fluoroscopic images were taken and saved for this procedure. Attention was directed to the right jugular tunneled central venous catheter. The right chest and tunneled central venous catheter were prepped and draped in sterile fashion. Maximal barrier sterile technique was utilized including caps, mask, sterile gowns, sterile gloves, sterile drape, hand hygiene and skin antiseptic. Skin around the catheter exit site was anesthetized with 1% lidocaine . The catheter cuff was exposed using blunt dissection. This catheter was completely removed. Fluoroscopic image confirmed removal of the catheter and no change in the position of the Port-A-Cath. FINDINGS: Left jugular Port-A-Cath tip at the superior cavoatrial junction. IMPRESSION: 1. Placement of a left jugular power injectable port  device. Catheter tip at the superior cavoatrial junction. 2. Removal of the right jugular tunneled central venous catheter. Electronically Signed   By: Elene Griffes M.D.   On: 02/25/2024 20:11    Assessment/Plan: S/p (L)port yesterday. POD #2 swelling, erythema, and pain worsened with purulent drainage. Plan for port removal. Given high fevers, will also check blood cultures. Pt concerned about having IV access as she is very hard stick and requires frequent infusions, labs, etc. Will place arm PICC for access needs. May eventually replace tunneled access but not until infection resolves. Risks and benefits of image guided port-a-catheter removal was discussed with the patient including, but not limited to bleeding, infection, pneumothorax, or fibrin sheath development and need for additional procedures.  All of the patient's questions were answered, patient is agreeable to proceed. Consent signed and in chart.    Prudence Brown PA-C 02/27/2024 1:32 PM

## 2024-02-27 NOTE — Progress Notes (Signed)
 [name] [id number] 1970/11/21 52 y.o.  Virtual Visit via Video Note  I connected with pt @ on 02/27/24 at 10:00 AM EDT by a video enabled telemedicine application and verified that I am speaking with the correct person using two identifiers.   I discussed the limitations of evaluation and management by telemedicine and the availability of in person appointments. The patient expressed understanding and agreed to proceed.  I discussed the assessment and treatment plan with the patient. The patient was provided an opportunity to ask questions and all were answered. The patient agreed with the plan and demonstrated an understanding of the instructions.   The patient was advised to call back or seek an in-person evaluation if the symptoms worsen or if the condition fails to improve as anticipated.  I provided 30 minutes of non-face-to-face time during this encounter.  The patient was located at home.  The provider was located at [facility].   Susan Camera, NP   Subjective:   Patient ID:  [name] is a 53 y.o. (DOB Oct 18, 1970) female.  Chief Complaint: No chief complaint on file.   HPI [name] presents for follow-up of MDD, GAD, insomnia, and panic attacks.  Describes mood today as "not any better". Pleasant. Flat. Tearful. Mood symptoms - reports anxiety, depression and irritability. Reports decreased interest and motivation. Reports panic attacks. Reports worry, rumination and over thinking. Reports ongoing health concerns - palliative care. Reports mood is variable. Stating "I'm not doing too good. Taking medications as prescribed and feels they are helpful. Working with [name] for therapy.  Energy levels lower. Active, does not have a regular exercise routine with current physical disabilities. Is unable to enjoy some usual interests and activities. Married. Lives with husband and son. Has a daughter - 59 in [place]. Spending time with family.   Appetite adequate. Weight gain 160 to 162 pounds. Reports sleep is variable.  Reports issues with focus and concentration difficulties - "it's extreme". Completing tasks. Managing some aspects of household. Disabled since 2016. Denies SI or HI.  Denies AH or VH. Denies self harm. Denies substance use.   Previous medication trials: Wellbutrin  - mean, Zoloft-not helpful   Review of Systems:  Review of Systems  Musculoskeletal:  Negative for gait problem.  Neurological:  Negative for tremors.  Psychiatric/Behavioral:         Please refer to HPI    Medications: I have reviewed the patient's current medications.  Current Outpatient Medications  Medication Sig Dispense Refill   albuterol  (PROVENTIL ) (2.5 MG/3ML) 0.083% nebulizer solution INHALE 3 ML BY NEBULIZATION EVERY 6 HOURS AS NEEDED FOR WHEEZING OR SHORTNESS OF BREATH 225 mL 1   albuterol  (VENTOLIN  HFA) 108 (90 Base) MCG/ACT inhaler Inhale 2 puffs into the lungs every 6 (six) hours as needed for wheezing or shortness of breath. 8 g 6   ALPRAZolam  (XANAX ) 1 MG tablet 1 tablet daily and 1 tablet PRN no more than 10 days a month. 40 tablet 2   amoxicillin -clavulanate (AUGMENTIN ) 875-125 MG tablet Take 1 tablet by mouth 2 (two) times daily. 14 tablet 0   aspirin  EC 81 MG tablet Take 1 tablet (81 mg total) by mouth daily. Swallow whole. (Patient taking differently: Take 81 mg by mouth at bedtime. Chewable) 90 tablet 3   Bismuth Tribromoph-Petrolatum (XEROFORM OCCLUSIVE GAUZE STRIP) PADS Apply 1 each topically as directed. 50 each 1   bisoprolol  (ZEBETA ) 5 MG tablet TAKE 1/2 TABLET BY MOUTH DAILY 7 tablet  0   bumetanide  (BUMEX ) 1 MG tablet TAKE 1 TABLET BY MOUTH TWICE A DAY 30 tablet 0   buPROPion  (WELLBUTRIN  XL) 150 MG 24 hr tablet TAKE 1 TABLET BY MOUTH EVERY DAY 90 tablet 0   clonazePAM  (KLONOPIN ) 1 MG tablet Take 1 tablet (1 mg total) by mouth 2 (two) times daily. 60 tablet 2   cyclobenzaprine  (FLEXERIL ) 5 MG tablet Take 1 tablet (5  mg total) by mouth 3 (three) times daily as needed for muscle spasms. 20 tablet 0   dexlansoprazole  (DEXILANT ) 60 MG capsule Take 1 capsule (60 mg total) by mouth daily. 90 capsule 3   diclofenac  (VOLTAREN ) 50 MG EC tablet Take 50 mg by mouth 2 (two) times daily. *as needed for migraine, Can repeat if needed*     diphenhydrAMINE  (BENADRYL ) 12.5 MG/5ML elixir Take 10 mLs (25 mg total) by mouth every 6 (six) hours as needed (nausea). 120 mL 0   EMGALITY  120 MG/ML SOSY Inject 120 mg into the skin every 28 (twenty-eight) days.     EPINEPHRINE  0.3 mg/0.3 mL IJ SOAJ injection INJECT 0.3 MG INTO THE MUSCLE AS NEEDED FOR ANAPHYLAXIS. 2 each 1   famotidine  (PEPCID ) 40 MG/5ML suspension Take 2.5 mLs (20 mg total) by mouth daily. 75 mL 0   famotidine  (PEPCID ) 40 MG/5ML suspension TAKE 1.3 MLS (10.4 MG TOTAL) BY MOUTH DAILY - DISCARD BOTTLE AFTER 30 DAYS FROM PICKUP 50 mL 0   FLORASTOR 250 MG capsule Take 250 mg by mouth 2 (two) times daily. Mid Morning and Mid Afternoon     fluconazole  (DIFLUCAN ) 200 MG tablet Take 2 tablets (400 mg total) by mouth daily for 1 day, THEN 1 tablet (200 mg total) daily for 20 days. 22 tablet 0   FLUoxetine  (PROZAC ) 20 MG capsule Take 1 capsule (20 mg total) by mouth daily. 90 capsule 0   fluticasone -salmeterol (ADVAIR HFA) 230-21 MCG/ACT inhaler Inhale 2 puffs into the lungs 2 (two) times daily. 1 each 11   Heparin  Na, Pork, Lock Flsh PF (BD HEPARIN  POSIFLUSH) 100 UNIT/ML SOLN USE 5 MLS IN PORT A CATH ONCE DAILY AFTER MEDICATION ADMINISTRATION AS A HEPLOCK AS DIRECTED. 150 mL 3   hydrocortisone  (CORTEF ) 10 MG tablet Take 1-2 tablets (10-20 mg total) by mouth See admin instructions. Take 20 mg in the am and 10mg  in the evening. Take after completing prednisone  (Patient taking differently: Take 10-20 mg by mouth See admin instructions. Take 20 mg in the am at 10 am and 10 mg in the afternoon. Take after completing prednisone )     hydrOXYzine  (ATARAX ) 25 MG tablet Take 1 tablet (25 mg  total) by mouth every 6 (six) hours as needed for anxiety. 30 tablet 0   hyoscyamine  (LEVSIN/SL) 0.125 MG SL tablet Place 1 tablet (0.125 mg total) under the tongue every 6 (six) hours as needed (esophageal spasm). Up to 1.25 mg daily 30 tablet 1   lamoTRIgine  (LAMICTAL ) 200 MG tablet TAKE 1 TABLET BY MOUTH EVERYDAY AT BEDTIME 30 tablet 2   lamoTRIgine  (LAMICTAL ) 25 MG tablet Take two tablets every morning. 60 tablet 2   Lancets (ONETOUCH DELICA PLUS LANCET33G) MISC 3 (three) times daily. for testing     lansoprazole (PREVACID) 15 MG capsule Take 30 mg by mouth in the morning and at bedtime.     lidocaine  (XYLOCAINE ) 2 % solution Use as directed 10 mLs in the mouth or throat every 4 (four) hours as needed (for esophageal pain and pain with swallowing.). 100 mL  1   LORazepam  (ATIVAN ) 0.5 MG tablet Take 1 tablet (0.5 mg total) by mouth 3 (three) times daily as needed for anxiety. (Patient taking differently: Place 0.5 mg into feeding tube 3 (three) times daily as needed for anxiety. Access Port For Nausea) 12 tablet 0   nitroGLYCERIN  (NITROSTAT ) 0.4 MG SL tablet DISSOLVE 1 TAB UNDER THE TONGUE EVERY 5 MINUTES AS NEEDED FOR CHEST PAIN. MAX OF 3 DOSES, THEN 911. 75 tablet 0   NURTEC 75 MG TBDP Take 1 tablet by mouth daily as needed.     OLANZapine  (ZYPREXA ) 5 MG tablet Take 1 tablet (5 mg total) by mouth at bedtime. 30 tablet 2   ONETOUCH VERIO test strip 3 (three) times daily. for testing     OXYGEN  Inhale 4-5 L/min into the lungs continuous.     pantoprazole  (PROTONIX ) 40 MG tablet TAKE 1 TABLET BY MOUTH EVERY DAY 90 tablet 1   PROAIR  HFA 108 (90 Base) MCG/ACT inhaler Inhale 2 puffs into the lungs every 4 (four) hours as needed for wheezing or shortness of breath.     QVAR  REDIHALER 80 MCG/ACT inhaler Inhale 1 puff into the lungs 2 (two) times daily.     RABEprazole (ACIPHEX) 20 MG tablet Take 1 tablet (20 mg total) by mouth daily. 30 tablet 0   rosuvastatin  (CRESTOR ) 10 MG tablet Take 10 mg by mouth  in the morning. Mid morning     sacubitril -valsartan  (ENTRESTO ) 24-26 MG TAKE 1 TABLET BY MOUTH TWICE A DAY (Patient taking differently: Take 1 tablet by mouth daily.) 60 tablet 5   sodium chloride  flush (BD POSIFLUSH) 0.9 % SOLN injection Use as directed for catheter maintenance 600 mL 3   Sodium Chloride  Flush (NORMAL SALINE FLUSH) 0.9 % SOLN Use as directed for central venous catheter maintenance 600 mL 3   Sodium Chloride  Flush (NORMAL SALINE FLUSH) 0.9 % SOLN Use as directed for catheter maintenance 600 mL 3   Sodium Chloride  Flush (NORMAL SALINE FLUSH) 0.9 % SOLN Use 10 mLs as directed for catheter maintenance. 600 mL 3   Sodium Chloride  Flush (NORMAL SALINE FLUSH) 0.9 % SOLN Use as directed for catheter maintenance. 600 mL 3   sucralfate  (CARAFATE ) 1 GM/10ML suspension Take 1 g by mouth daily as needed (as directed for ulcers).     SYNTHROID  100 MCG tablet Take 1 tablet (100 mcg total) by mouth every morning. (Patient taking differently: Take 100 mcg by mouth every morning. * Must be name brand*)     Tiotropium Bromide  Monohydrate (SPIRIVA  RESPIMAT) 1.25 MCG/ACT AERS Inhale 2 puffs into the lungs daily. 4 g 11   topiramate  (TOPAMAX ) 50 MG tablet Take 150 mg by mouth at bedtime.     zolpidem  (AMBIEN  CR) 12.5 MG CR tablet TAKE ONE TABLET BY MOUTH AT BEDTIME AS NEEDED FOR SLEEP. 90 tablet 0   zonisamide (ZONEGRAN) 100 MG capsule Take 100 mg by mouth daily.     No current facility-administered medications for this visit.    Medication Side Effects: None  Allergies:  Allergies  Allergen Reactions   Ferrous Bisglycinate Chelate [Iron] Anaphylaxis and Other (See Comments)    Only IV - only FERRLICET   Mushroom Extract Complex (Obsolete) Anaphylaxis   Na Ferric Gluc Cplx In Sucrose Anaphylaxis   Cymbalta [Duloxetine Hcl] Swelling and Anxiety   Hydromorphone  Other (See Comments)    BP drop and heart rate drops 5.1.20 PT REPORTS THAT SHE TAKES DILAUDID  AT HOME   Ondansetron  Hcl Other (See  Comments)   Promethazine Other (See Comments)    Other reaction(s): Unknown   Succinylcholine Other (See Comments)    Lock Jaw   Buprenorphine Hcl Hives   Compazine Other (See Comments)    Altered mental status Aggression   Duloxetine     Other reaction(s): Not available   Metoclopramide Other (See Comments)    Dystonia   Morphine And Codeine Hives   Ondansetron  Hives and Rash    Other reaction(s): Unknown Other reaction(s): Unknown   Promethazine Hcl Hives   Tegaderm Ag Mesh [Silver ] Rash    Old formulation only, is able to tolerate new formulation    Past Medical History:  Diagnosis Date   Addison's disease (HCC)    Adrenal insufficiency (HCC)    Anemia    Anxiety    Aortic stenosis    Aortic stenosis    Appendicitis 12/19/2009   Appendicitis    Breast cancer (HCC)    STATUS POST BILATERAL MASTECTOMY. STATUS POST RECONSTRUCTION. SHE HAD SILICONE BREAST IMPLANTS AND THE LEFT IMPLANT IS LEAKING SLIGHTLY   Cellulitis of right middle finger 11/07/2018   Cervical cancer (HCC) 12/23/2018   Chest pain    CHF with right heart failure (HCC) 04/17/2017   Chronic respiratory failure with hypoxia (HCC) 12/23/2018   CKD (chronic kidney disease) stage 3, GFR 30-59 ml/min (HCC)    Cough variant asthma 04/13/2019   Depression    Functional neurological symptom disorder with mixed symptoms 2023   GERD (gastroesophageal reflux disease)    takes Dexilant  and carafate  and gi coctail    Headache    migraines on a daily and monthly regimen    Heart murmur    History of kidney stones    Hodgkin lymphoma (HCC)    STATUS POST MANTLE RADIATION   Hodgkin's lymphoma (HCC)    1987   Hypertension    Hypoxia    Multiple lung nodules    bilateral   Necrotizing fasciitis (HCC) 12/23/2018   Non-ischemic cardiomyopathy (HCC)    Osteoporosis    Ovarian tumor (benign)    bilateral   Palpitations    Pituitary adenoma (HCC) 12/23/2018   Pneumonia    PONV (postoperative nausea and  vomiting)    Pre-diabetes    per pt; no meds   Pulmonary hypertension (HCC) 12/23/2018   Raynaud phenomenon    Right heart failure (HCC) 04/17/2017   Seizures (HCC)    last febrile seizure was approx 3 weeks ago per report on 12/01/2020   Supplemental oxygen  dependent    3 liters   SVT (supraventricular tachycardia) (HCC)    Tachycardia    Thyroid  cancer (HCC)    STATUS POST SURGICAL REMOVAL-CURRENT ON THYROID  REPLACEMENT    Family History  Adopted: Yes  Family history unknown: Yes    Social History   Socioeconomic History   Marital status: Married    Spouse name: Not on file   Number of children: Not on file   Years of education: Not on file   Highest education level: Not on file  Occupational History   Not on file  Tobacco Use   Smoking status: Never   Smokeless tobacco: Never  Vaping Use   Vaping status: Never Used  Substance and Sexual Activity   Alcohol use: Not Currently    Comment: social    Drug use: No   Sexual activity: Not Currently    Birth control/protection: Surgical  Other Topics Concern   Not on file  Social History Narrative  Not on file   Social Drivers of Health   Financial Resource Strain: Medium Risk (08/21/2017)   Received from [facility], [facility]   Overall Financial Resource Strain (CARDIA)    Difficulty of Paying Living Expenses: Somewhat hard  Food Insecurity: No Food Insecurity (04/23/2023)   Hunger Vital Sign    Worried About Running Out of Food in the Last Year: Never true    Ran Out of Food in the Last Year: Never true  Transportation Needs: No Transportation Needs (04/23/2023)   PRAPARE - Administrator, Civil Service (Medical): No    Lack of Transportation (Non-Medical): No  Physical Activity: Unknown (08/21/2017)   Received from [facility], [facility]   Exercise Vital Sign    Days of Exercise per Week: 0 days    Minutes of Exercise per  Session: Not on file  Stress: Stress Concern Present (08/21/2017)   Received from [facility], [facility]   [employer] of Occupational Health - Occupational Stress Questionnaire    Feeling of Stress : Very much  Social Connections: Unknown (02/12/2022)   Received from [facility], [facility]   Social Network    Social Network: Not on file  Intimate Partner Violence: Not At Risk (04/23/2023)   Humiliation, Afraid, Rape, and Kick questionnaire    Fear of Current or Ex-Partner: No    Emotionally Abused: No    Physically Abused: No    Sexually Abused: No    Past Medical History, Surgical history, Social history, and Family history were reviewed and updated as appropriate.   Please see review of systems for further details on the patient's review from today.   Objective:   Physical Exam:  LMP  (LMP Unknown)   Physical Exam Constitutional:      General: She is not in acute distress. Musculoskeletal:        General: No deformity.  Neurological:     Mental Status: She is alert and oriented to person, place, and time.     Coordination: Coordination normal.  Psychiatric:        Attention and Perception: Attention and perception normal. She does not perceive auditory or visual hallucinations.        Mood and Affect: Mood is anxious and depressed. Affect is flat and tearful. Affect is not labile, blunt, angry or inappropriate.        Speech: Speech normal.        Behavior: Behavior normal.        Thought Content: Thought content normal. Thought content is not paranoid or delusional. Thought content does not include homicidal or suicidal ideation. Thought content does not include homicidal or suicidal plan.        Cognition and Memory: Cognition and memory normal.        Judgment: Judgment normal.     Comments: Insight intact     Lab Review:     Component Value Date/Time   NA 141 12/07/2023 1259   NA 140 08/20/2022 1513   K 3.6  12/07/2023 1259   CL 104 12/07/2023 1259   CO2 28 12/07/2023 1259   GLUCOSE 79 12/07/2023 1259   BUN 28 (H) 12/07/2023 1259   BUN 28 (H) 08/20/2022 1513   CREATININE 1.31 (H) 12/07/2023 1259   CREATININE 1.08 (H) 02/09/2022 1202   CALCIUM  9.1 12/07/2023 1259   PROT 6.8 09/03/2023 1315   PROT 7.1 01/23/2018 1225  ALBUMIN 3.8 09/03/2023 1315   ALBUMIN 4.4 01/23/2018 1225   AST 18 09/03/2023 1315   AST 26 02/09/2022 1202   ALT 25 09/03/2023 1315   ALT 40 02/09/2022 1202   ALKPHOS 56 09/03/2023 1315   BILITOT 0.3 09/03/2023 1315   BILITOT 0.4 02/09/2022 1202   GFRNONAA 49 (L) 12/07/2023 1259   GFRNONAA >60 02/09/2022 1202   GFRAA >60 04/27/2020 0613   GFRAA >60 02/10/2020 1020       Component Value Date/Time   WBC 9.6 12/07/2023 1259   RBC 3.94 12/07/2023 1259   HGB 9.6 (L) 12/07/2023 1259   HGB 10.6 (L) 08/20/2022 1513   HCT 31.2 (L) 12/07/2023 1259   HCT 34.4 08/20/2022 1513   PLT 370 12/07/2023 1259   PLT 414 08/20/2022 1513   MCV 79.2 (L) 12/07/2023 1259   MCV 78 (L) 08/20/2022 1513   MCH 24.4 (L) 12/07/2023 1259   MCHC 30.8 12/07/2023 1259   RDW 15.9 (H) 12/07/2023 1259   RDW 16.5 (H) 08/20/2022 1513   LYMPHSABS 2.2 09/03/2023 1315   MONOABS 1.0 09/03/2023 1315   EOSABS 0.1 09/03/2023 1315   BASOSABS 0.1 09/03/2023 1315    No results found for: "POCLITH", "LITHIUM"   No results found for: "PHENYTOIN", "PHENOBARB", "VALPROATE", "CBMZ"   .res Assessment: Plan:    Plan:  PDMP reviewed  Clonazepam  1mg  - twice daily. Xanax  1mg  daily and one extra as needed for extreme anxiety up to10 days a month.  Prozac  20mg  daily  Wellbutrin  XL150mg  every morning - did not tolerate increase in dose - reports seizure history  Lamictal  200mg  daily Lamictal  50mg  every morning  Olanzapine  5mg  at hs   Seeing [name] therapy visits.  RTC 4 weeks  30 minutes spent dedicated to the care of this patient on the date of this encounter to include pre-visit review of  records, ordering of medication, post visit documentation, and face-to-face time with the patient discussing MDD, GAD, insomnia, and panic attacks. Discussed continuing current medication regimen.  Discussed potential benefits, risk, and side effects of benzodiazepines to include potential risk of tolerance and dependence, as well as possible drowsiness.  Advised patient not to drive if experiencing drowsiness and to take lowest possible effective dose to minimize risk of dependence and tolerance.   Counseled patient regarding potential benefits, risks, and side effects of Lamictal  to include potential risk of Stevens-Johnson syndrome. Advised patient to stop taking Lamictal  and contact office immediately if rash develops and to seek urgent medical attention if rash is severe and/or spreading quickly.  Discussed potential metabolic side effects associated with atypical antipsychotics, as well as potential risk for movement side effects. Advised pt to contact office if movement side effects occur.    Patient advised to contact office with any questions, adverse effects, or acute worsening in signs and symptoms.  Diagnoses and all orders for this visit:  Major depressive disorder, recurrent episode, moderate (HCC) -     lamoTRIgine  (LAMICTAL ) 200 MG tablet; TAKE 1 TABLET BY MOUTH EVERYDAY AT BEDTIME -     lamoTRIgine  (LAMICTAL ) 25 MG tablet; Take two tablets every morning. -     OLANZapine  (ZYPREXA ) 5 MG tablet; Take 1 tablet (5 mg total) by mouth at bedtime. -     FLUoxetine  (PROZAC ) 20 MG capsule; Take 1 capsule (20 mg total) by mouth daily.  Panic attacks -     ALPRAZolam  (XANAX ) 1 MG tablet; 1 tablet daily and 1 tablet PRN no more than 10  days a month. -     clonazePAM  (KLONOPIN ) 1 MG tablet; Take 1 tablet (1 mg total) by mouth 2 (two) times daily.  Generalized anxiety disorder -     OLANZapine  (ZYPREXA ) 5 MG tablet; Take 1 tablet (5 mg total) by mouth at bedtime. -     FLUoxetine  (PROZAC )  20 MG capsule; Take 1 capsule (20 mg total) by mouth daily.  Insomnia, unspecified type     Please see After Visit Summary for patient specific instructions.  Future Appointments  Date Time Provider Department Center  03/04/2024 10:00 AM Annis Kinder, DO [facility]  03/26/2024  9:00 AM Kelleen Patee, LCSW [facility] None    No orders of the defined types were placed in this encounter.     -------------------------------

## 2024-02-27 NOTE — Procedures (Signed)
 Interventional Radiology Procedure Note  Procedure:  1) Image guided PICC placement 2) Port removal  Findings: Please refer to procedural dictation for full description. Right basilic PICC, 37 cm, tip at cavoatrial junction.  Port removed.  Despite surrounding erythema and mild induration, no evidence of purulence.  The port reservoir was flipped.  Pocket irrigated and packed with iodoform gauze.  Port catheter tip sent for culture.  Complications: None immediate  Estimated Blood Loss: < 5 mL  Recommendations: PICC ready for immediate use. Port site check tomorrow.  Follow up culture. 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     Creasie Doctor, MD

## 2024-02-28 ENCOUNTER — Other Ambulatory Visit (HOSPITAL_COMMUNITY): Payer: Self-pay

## 2024-02-28 ENCOUNTER — Encounter: Payer: Self-pay | Admitting: Hematology and Oncology

## 2024-02-29 ENCOUNTER — Telehealth: Payer: Self-pay | Admitting: Student

## 2024-02-29 NOTE — Telephone Encounter (Signed)
 Interventional Radiology Brief Note:  Patient known to IR from multiple visits last week for [facility] placement and subsequent removal.  Her Port site was packed with iodoform gauze and she now has increased serous drainage from the [employer].   Discussed with patient over the phone.  No fever, purulent drainage.  She is on abx. Site mildly tender with palpation and positionally. Encouraged frequent dressing changes through the weekend to keep site protected.  Will plan to schedule for an appointment early next week (Monday preferred) for site check and re-packing.   Patient and husband verbalize understanding.   Isidro Monks, MS RD PA-C

## 2024-03-01 ENCOUNTER — Encounter: Payer: Self-pay | Admitting: Cardiovascular Disease

## 2024-03-01 LAB — CATH TIP CULTURE: Culture: NO GROWTH

## 2024-03-01 NOTE — Progress Notes (Unsigned)
 Cardiology Office Note   Date:  03/02/2024   ID:  [name], DOB August 09, 1971, MRN [id number]  PCP:  Toppin, Jean M, MD  Cardiologist:   Ahmad Alert, MD   Chief Complaint  Patient presents with   Hypertension        Palpitations   Problem List:  1. Palpitations/premature ventricular contractions 2.  history of Hodgkin's lymphoma-status post mantle radiation 3. History of breast cancer-status post Reconstruction 4. History of cervical cancer 5. 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 2015 for regrowth of tumor.  6. History of thyroidectomy - hx of thyroid  cancer  7. Raynaud's phenomenon 8. Appendectomy 9. History chest pains-normal heart catheterization, normal TEE 10.   history of adrenal insufficiency  11. Hyperlipidemia   Previous notes.[name] is done very well since I last saw her about a year ago. She has had some occasional palpitations that are typically controlled with metoprolol . She's not had any episodes of chest pain. She's been able to stop all of her steroids. Her adrenal glands have gradually improved and her now producing cortisol.  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  She is still working at [employer].   Feb 24, 2013:  She has done well since I last saw her .  She has had her breast implants replaced ( old ones were leaking).  Her costochondritis has resolved.  She has not been lifting.    She has run in 6 5K races this year.  She is playing tennis.    She is still in [employer] school.  She is doing her orthopedic rotation.    Nov. 11, 2014:  Her HR has been high.  She has a head ache frequently.  She started Adderall recently.   The adderall has help with her focus but she feels much worse in it.   She has been on the Adderall for 2 months and the tachycardia started about 1 week ago.  She was doing cycle classes 3  times a week.  She is exercising regularly and for the past week, her HR is extremely fast.   She has gone into menapause and has lots of hot flashes.   Feb. 23, 2015:  She has been having some recent episodes of tachycardia. We increased her metoprolol  at the last visit. We performed an echocardiogram which revealed   Left ventricle: The cavity size was normal. Systolic function was normal. The estimated ejection fraction was in the range of 55% to 60%. Wall motion was normal; there were no regional wall motion abnormalities. Left ventricular diastolic function parameters were normal. - Aortic valve: Trivial regurgitation. - Mitral valve: Trivial regurgitation  She tried to stop her Adderall ( did not do well with that).   She decreased her dose slightly and she is not having much tachycardia. She has been   She has been found to have some osteoperosis and osteopenia.    March 09, 2014:  [name] is doing ok.. No recent cardiac problems.   She has been very active and is feeling great.  Playing tennis on a regular basis.     Feb. 17, 2016:   [name] is a 53 y.o. female who presents for for evaluation of palpitations , hypotension. Echo yesterday  Left ventricle: The cavity size was normal. Systolic function was   normal. The estimated ejection fraction was  in the range of 55%   to 60%. Wall motion was normal; there were no regional wall   motion abnormalities. - Aortic valve: There was trivial regurgitation. - Mitral valve: Calcified annulus.  Labs were ordered yesterday but she was not able to get them. Continues to have palpitations today She is off her Estrogen at this point.  She is back on the Toprol  - skipped 2 days.  Has been taking the PRN propranolol   Not feeling well and has not been eating or drinking well. We given 1 dose of lasix    December 15, 2014:  She has continued to have palpitations  - last 20-30 seconds .  HR has remained fairly high. Has been working  out regularly .  These are going ok.    Feb 03, 2015: [name] continues to have palpitations . She wore a monitor but actually only wore is for about 1/2 of the time ( she was at [employer]) We have discussed implantable loop.  I have some concerns about doing any procedures on her .  She has only been taking the toprol  50 mg a day instead of 75.   May 17, 2015:  [name] is seen today for follow up. She has a history of hyperlipidemia and we sent her to lipid clinic. She is tolerating the Crestor  very well.   Still having palpitations but learning how to manage .  Oct. 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 Has been seen at [facility] and is now at [facility]  She has sent scans to [facility]. Is on steroids to shrink tumor, has gained lots of weight.  Is hoping to be able to get a bx so that she have this tumor treated.   She is now on home O2.  O2 sats dropped to 78 while sleeping on room air.    O2 sats on 2.5 liters are good. She uses 4 lpm at night .   Is having more palpitations. Would like a refill for propranolol '   April 18, 2017:  [name] is seen today  Is still on home O2.,  Has chronic respiratory failure ( unclear etiology at this point )   Has RV failure.   Had a right heart cath.  PA pressures were only minimally elevated.  Had significant leg swelling   Has been on steroids.  Has developed pre-diabetes  Nov. 15,, 2018  In Sept 25,she woke up from a nap Ad tingling and numbness in her legs , had no strength in her legs . 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   2nd MRI of back showed a lesion between T10 -T 11.   Nothing that truly showed compression.  Was transferred to rehab unit in [place].    Has regained some motor ability I her lower abd.    Ruptured her left breast implant using her arms in rehab  Scheduled to have her implants removed later this month .   Still on home O2,  Palpitations have been well controlled.   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 ( Bronch in Dec. 2018)    Has pulmonary HTN.  Lasix  is no working for her at this point  -  takes Lasix  20 mg a day   Is using a walker now - was in a wheelchair last time  Had temporary paralysis of her legs.  Improving .  Regaining her strength.    Jan. 27, 2020:  [name] is seen for follow up  Has had pressure ulders in her hands  Is on chronic steroids for her adrenal insuff.  Has had an echocardiogram at [facility] in December.  She has normal left ventricular systolic function with an ejection fraction of 55%.  She has trivial mitral regurgitation and trivial tricuspid regurgitation as well as trivial aortic insufficiency. Was no comment on pulmonary pressures.  Sept. 23, 2022:   [name] is seen  Covid infection 11 days ago ,  hreceived Bebtelovimab . 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 16, 2023 [name] is seen for follow up of her palpitations, adrenal insufficiency Chest pain   Recently in the hospital  Hospitalized 4 times since June.  Has had URIs   She was in the ICU during this last hospitalization  Fevers / chills  Was found to have pneumonia , Temp of 102   Has diffuse whole body anasarca , pitting in her hands   Taking nebs   Coughs when she lies back  Will repeat her echo  [facility]  Start Entresto  24-26 BID ( has documented diastolic CHF)    Nov. 14, 2023  No show, patient cancelled   Aug 20, 2022 [name] is seen for follow up of her severe dyspnea, wheezing, diffuse wheezing , productive cough She was started on Entresto    Echo from Oct. 30, 2023 Normal LV  systolic function,  grade I DD RV function and pressure was normal   Has been having some CP , chest pressure  Radiates to intrascapular   Had RCA coronary calcification   We discussed her angina  HR is too fast for Cor CTA   Discussed cath  Will discuss with Dr. Bensimhon  Start bisoprolol  2.5 QD  She has been a DNR in the past due to her gradually declining condition.   I have suggested that she rescind her DNR during the cath   March 02, 2024 [name] is seen for follow up of her shortness of breath, palpitations  I last saw her in Nov 2023. R and L heart cath in Dec. 2023  Ao = 90/54 (71) LV = 103/13 RA =  4 RV =  26/5 PA =  23/9 (17) PCW = 10 Fick cardiac output/index = 6.3/3.3 PVR = 1.2 WU FA sat = 99% PA sat = 71%, 71% AoV with mid stenosis: Mean gradient AVA 1.99cm2   Assessment: 1. EF 55-65% 2. Normal coronaries 3. Normal filling pressures with no evidence of PAH 4. Very mild aortic stenosis   She has had several hospitalizations and ER visits for fever, chills,  increased shortness of breath, Nausea and vomiting   since I saw her   She was on Hospice care for a brief time but was upgraded to Palliative care in mid- 2024.  Stable from a cardiac standpoint      Past Medical History:  Diagnosis Date   Addison's disease (HCC)    Adrenal insufficiency (HCC)    Anemia    Anxiety    Aortic stenosis    Aortic stenosis  Appendicitis 12/19/2009   Appendicitis    Breast cancer (HCC)    STATUS POST BILATERAL MASTECTOMY. STATUS POST RECONSTRUCTION. SHE HAD SILICONE BREAST IMPLANTS AND THE LEFT IMPLANT IS LEAKING SLIGHTLY   Cellulitis of right middle finger 11/07/2018   Cervical cancer (HCC) 12/23/2018   Chest pain    CHF with right heart failure (HCC) 04/17/2017   Chronic respiratory failure with hypoxia (HCC) 12/23/2018   CKD (chronic kidney disease) stage 3, GFR 30-59 ml/min (HCC)    Cough variant asthma 04/13/2019   Depression    Functional  neurological symptom disorder with mixed symptoms 2023   GERD (gastroesophageal reflux disease)    takes Dexilant  and carafate  and gi coctail    Headache    migraines on a daily and monthly regimen    Heart murmur    History of kidney stones    Hodgkin lymphoma (HCC)    STATUS POST MANTLE RADIATION   Hodgkin's lymphoma (HCC)    1987   Hypertension    Hypoxia    Multiple lung nodules    bilateral   Necrotizing fasciitis (HCC) 12/23/2018   Non-ischemic cardiomyopathy (HCC)    Osteoporosis    Ovarian tumor (benign)    bilateral   Palpitations    Pituitary adenoma (HCC) 12/23/2018   Pneumonia    PONV (postoperative nausea and vomiting)    Pre-diabetes    per pt; no meds   Pulmonary hypertension (HCC) 12/23/2018   Raynaud phenomenon    Right heart failure (HCC) 04/17/2017   Seizures (HCC)    last febrile seizure was approx 3 weeks ago per report on 12/01/2020   Supplemental oxygen  dependent    3 liters   SVT (supraventricular tachycardia) (HCC)    Tachycardia    Thyroid  cancer (HCC)    STATUS POST SURGICAL REMOVAL-CURRENT ON THYROID  REPLACEMENT    Past Surgical History:  Procedure Laterality Date   ABDOMINAL HYSTERECTOMY     AMPUTATION Left 01/30/2019   Procedure: Left Index finger amputation with flap reconstruction and repair reconstruction;  Surgeon: Ronn Cohn, MD;  Location: [facility];  Service: Orthopedics;  Laterality: Left;   APPENDECTOMY     BIOPSY OF SKIN SUBCUTANEOUS TISSUE AND/OR MUCOUS MEMBRANE  12/26/2023   Procedure: BIOPSY, SKIN, SUBCUTANEOUS TISSUE, OR MUCOUS MEMBRANE;  Surgeon: Ace Holder, MD;  Location: [facility];  Service: Gastroenterology;;   breast implants and removal      breast implants but leaking      CARDIAC CATHETERIZATION  05/18/2009   NORMAL CATH   CESAREAN SECTION  2007   and 1997   COLONOSCOPY     ESOPHAGEAL DILATION  02/11/2024   Procedure: DILATION, ESOPHAGUS;  Surgeon: Annis Kinder, DO;  Location: [facility];   Service: Gastroenterology;;   ESOPHAGOGASTRODUODENOSCOPY N/A 12/26/2023   Procedure: EGD (ESOPHAGOGASTRODUODENOSCOPY);  Surgeon: Ace Holder, MD;  Location: [facility];  Service: Gastroenterology;  Laterality: N/A;   ESOPHAGOGASTRODUODENOSCOPY N/A 02/11/2024   Procedure: EGD (ESOPHAGOGASTRODUODENOSCOPY);  Surgeon: Annis Kinder, DO;  [facility]: [facility];  Service: Gastroenterology;  Laterality: N/A;   HOT HEMOSTASIS N/A 12/26/2023   Procedure: EGD, WITH ARGON PLASMA COAGULATION;  Surgeon: Ace Holder, MD;  Location: [facility];  Service: Gastroenterology;  Laterality: N/A;   hx of chemotherapy      hx of radiation therapy      I & D EXTREMITY Left 12/23/2018   Procedure: IRRIGATION AND DEBRIDEMENT HAND / INDEX FINGER;  Surgeon: Ronn Cohn, MD;  Location: [facility];  Service:  Orthopedics;  Laterality: Left;   IR CV LINE INJECTION  03/22/2022   IR FLUORO GUIDE CV LINE RIGHT  10/09/2022   IR FLUORO GUIDE CV LINE RIGHT  01/18/2023   IR FLUORO GUIDE CV LINE RIGHT  03/26/2023   IR FLUORO GUIDE CV LINE RIGHT  10/10/2023   IR FLUORO GUIDE CV LINE RIGHT  01/06/2024   IR IMAGING GUIDED PORT INSERTION  05/06/2020   IR IMAGING GUIDED PORT INSERTION  12/04/2021   IR IMAGING GUIDED PORT INSERTION  02/25/2024   IR REMOVAL TUN ACCESS W/ PORT W/O FL MOD SED  04/27/2020   IR REMOVAL TUN ACCESS W/ PORT W/O FL MOD SED  12/04/2021   IR REMOVAL TUN ACCESS W/ PORT W/O FL MOD SED  03/30/2022   IR REMOVAL TUN ACCESS W/ PORT W/O FL MOD SED  02/27/2024   IR REMOVAL TUN CV CATH W/O FL  02/25/2024   IR US  GUIDE VASC ACCESS RIGHT  10/09/2022   KIDNEY STONE SURGERY     LUMBAR PUNCTURE W/ INTRATHECAL CHEMOTHERAPY     MASTECTOMY     PITUITARY SURGERY     RIGHT/LEFT HEART CATH AND CORONARY ANGIOGRAPHY N/A 04/02/2018   Procedure: RIGHT/LEFT HEART CATH AND CORONARY ANGIOGRAPHY;  Surgeon: Odie Benne, MD;  [facility];  Service: Cardiovascular;  Laterality: N/A;    RIGHT/LEFT HEART CATH AND CORONARY ANGIOGRAPHY N/A 08/31/2022   Procedure: RIGHT/LEFT HEART CATH AND CORONARY ANGIOGRAPHY;  Surgeon: Mardell Shade, MD;  [facility];  Service: Cardiovascular;  Laterality: N/A;   TOOTH EXTRACTION N/A 12/05/2020   Procedure: DENTAL RESTORATION/EXTRACTIONS;  Surgeon: Auburn Leak, MD;  Location: [facility];  Service: Oral Surgery;  Laterality: N/A;   TOTAL THYROIDECTOMY     VIDEO BRONCHOSCOPY Bilateral 11/14/2018   Procedure: VIDEO BRONCHOSCOPY WITHOUT FLUORO;  Surgeon: Quillian Brunt, MD;  Location: [facility];  Service: Cardiopulmonary;  Laterality: Bilateral;   VIDEO BRONCHOSCOPY WITH ENDOBRONCHIAL ULTRASOUND N/A 11/19/2018   Procedure: VIDEO BRONCHOSCOPY WITH ENDOBRONCHIAL ULTRASOUND;  Surgeon: Quillian Brunt, MD;  Location: [facility];  Service: Thoracic;  Laterality: N/A;     Current Outpatient Medications  Medication Sig Dispense Refill   albuterol  (PROVENTIL ) (2.5 MG/3ML) 0.083% nebulizer solution INHALE 3 ML BY NEBULIZATION EVERY 6 HOURS AS NEEDED FOR WHEEZING OR SHORTNESS OF BREATH 225 mL 1   albuterol  (VENTOLIN  HFA) 108 (90 Base) MCG/ACT inhaler Inhale 2 puffs into the lungs every 6 (six) hours as needed for wheezing or shortness of breath. 8 g 6   ALPRAZolam  (XANAX ) 1 MG tablet 1 tablet daily and 1 tablet PRN no more than 10 days a month. 40 tablet 2   amoxicillin -clavulanate (AUGMENTIN ) 875-125 MG tablet Take 1 tablet by mouth 2 (two) times daily. 14 tablet 0   aspirin  EC 81 MG tablet Take 1 tablet (81 mg total) by mouth daily. Swallow whole. (Patient taking differently: Take 81 mg by mouth at bedtime. Chewable) 90 tablet 3   Bismuth Tribromoph-Petrolatum (XEROFORM OCCLUSIVE GAUZE STRIP) PADS Apply 1 each topically as directed. 50 each 1   bumetanide  (BUMEX ) 1 MG tablet TAKE 1 TABLET BY MOUTH TWICE A DAY 30 tablet 0   buPROPion  (WELLBUTRIN  XL) 150 MG 24 hr tablet TAKE 1 TABLET BY MOUTH EVERY DAY 90 tablet 0    clonazePAM  (KLONOPIN ) 1 MG tablet Take 1 tablet (1 mg total) by mouth 2 (two) times daily. 60 tablet 2   dexlansoprazole  (DEXILANT ) 60 MG capsule Take 1 capsule (60  mg total) by mouth daily. 90 capsule 3   diclofenac  (VOLTAREN ) 50 MG EC tablet Take 50 mg by mouth 2 (two) times daily. *as needed for migraine, Can repeat if needed*     diphenhydrAMINE  (BENADRYL ) 12.5 MG/5ML elixir Take 10 mLs (25 mg total) by mouth every 6 (six) hours as needed (nausea). 120 mL 0   EMGALITY  120 MG/ML SOSY Inject 120 mg into the skin every 28 (twenty-eight) days.     EPINEPHRINE  0.3 mg/0.3 mL IJ SOAJ injection INJECT 0.3 MG INTO THE MUSCLE AS NEEDED FOR ANAPHYLAXIS. 2 each 1   famotidine  (PEPCID ) 40 MG/5ML suspension Take 2.5 mLs (20 mg total) by mouth daily. 75 mL 0   famotidine  (PEPCID ) 40 MG/5ML suspension TAKE 1.3 MLS (10.4 MG TOTAL) BY MOUTH DAILY - DISCARD BOTTLE AFTER 30 DAYS FROM PICKUP 50 mL 0   FLORASTOR 250 MG capsule Take 250 mg by mouth 2 (two) times daily. Mid Morning and Mid Afternoon     fluconazole  (DIFLUCAN ) 200 MG tablet Take 2 tablets (400 mg total) by mouth daily for 1 day, THEN 1 tablet (200 mg total) daily for 20 days. 22 tablet 0   FLUoxetine  (PROZAC ) 20 MG capsule Take 1 capsule (20 mg total) by mouth daily. 90 capsule 0   fluticasone -salmeterol (ADVAIR HFA) 230-21 MCG/ACT inhaler Inhale 2 puffs into the lungs 2 (two) times daily. 1 each 11   Heparin  Na, Pork, Lock Flsh PF (BD HEPARIN  POSIFLUSH) 100 UNIT/ML SOLN USE 5 MLS IN PORT A CATH ONCE DAILY AFTER MEDICATION ADMINISTRATION AS A HEPLOCK AS DIRECTED. 150 mL 3   hydrocortisone  (CORTEF ) 10 MG tablet Take 1-2 tablets (10-20 mg total) by mouth See admin instructions. Take 20 mg in the am and 10mg  in the evening. Take after completing prednisone  (Patient taking differently: Take 10-20 mg by mouth See admin instructions. Take 20 mg in the am at 10 am and 10 mg in the afternoon. Take after completing prednisone )     hyoscyamine  (LEVSIN/SL) 0.125 MG  SL tablet Place 1 tablet (0.125 mg total) under the tongue every 6 (six) hours as needed (esophageal spasm). Up to 1.25 mg daily 30 tablet 1   lamoTRIgine  (LAMICTAL ) 200 MG tablet TAKE 1 TABLET BY MOUTH EVERYDAY AT BEDTIME 30 tablet 2   lamoTRIgine  (LAMICTAL ) 25 MG tablet Take two tablets every morning. 60 tablet 2   Lancets (ONETOUCH DELICA PLUS LANCET33G) MISC 3 (three) times daily. for testing     lidocaine  (XYLOCAINE ) 2 % solution Use as directed 10 mLs in the mouth or throat every 4 (four) hours as needed (for esophageal pain and pain with swallowing.). 100 mL 1   LORazepam  (ATIVAN ) 0.5 MG tablet Take 1 tablet (0.5 mg total) by mouth 3 (three) times daily as needed for anxiety. (Patient taking differently: Place 0.5 mg into feeding tube 3 (three) times daily as needed for anxiety. Access Port For Nausea) 12 tablet 0   nitroGLYCERIN  (NITROSTAT ) 0.4 MG SL tablet DISSOLVE 1 TAB UNDER THE TONGUE EVERY 5 MINUTES AS NEEDED FOR CHEST PAIN. MAX OF 3 DOSES, THEN 911. 75 tablet 0   NURTEC 75 MG TBDP Take 1 tablet by mouth daily as needed.     OLANZapine  (ZYPREXA ) 5 MG tablet Take 1 tablet (5 mg total) by mouth at bedtime. 30 tablet 2   ONETOUCH VERIO test strip 3 (three) times daily. for testing     OXYGEN  Inhale 4-5 L/min into the lungs continuous.     potassium chloride  (KLOR-CON )  10 MEQ tablet as needed.     PROAIR  HFA 108 (90 Base) MCG/ACT inhaler Inhale 2 puffs into the lungs every 4 (four) hours as needed for wheezing or shortness of breath.     QVAR  REDIHALER 80 MCG/ACT inhaler Inhale 1 puff into the lungs 2 (two) times daily.     rosuvastatin  (CRESTOR ) 10 MG tablet Take 10 mg by mouth in the morning. Mid morning     sacubitril -valsartan  (ENTRESTO ) 24-26 MG TAKE 1 TABLET BY MOUTH TWICE A DAY (Patient taking differently: Take 1 tablet by mouth daily.) 60 tablet 5   sodium chloride  flush (BD POSIFLUSH) 0.9 % SOLN injection Use as directed for catheter maintenance 600 mL 3   Sodium Chloride  Flush  (NORMAL SALINE FLUSH) 0.9 % SOLN Use as directed for central venous catheter maintenance 600 mL 3   Sodium Chloride  Flush (NORMAL SALINE FLUSH) 0.9 % SOLN Use as directed for catheter maintenance 600 mL 3   Sodium Chloride  Flush (NORMAL SALINE FLUSH) 0.9 % SOLN Use 10 mLs as directed for catheter maintenance. 600 mL 3   Sodium Chloride  Flush (NORMAL SALINE FLUSH) 0.9 % SOLN Use as directed for catheter maintenance. 600 mL 3   sucralfate  (CARAFATE ) 1 GM/10ML suspension Take 1 g by mouth daily as needed (as directed for ulcers).     SYNTHROID  100 MCG tablet Take 1 tablet (100 mcg total) by mouth every morning. (Patient taking differently: Take 100 mcg by mouth every morning. * Must be name brand*)     Tiotropium Bromide  Monohydrate (SPIRIVA  RESPIMAT) 1.25 MCG/ACT AERS Inhale 2 puffs into the lungs daily. 4 g 11   topiramate  (TOPAMAX ) 50 MG tablet Take 150 mg by mouth at bedtime.     zolpidem  (AMBIEN  CR) 12.5 MG CR tablet TAKE ONE TABLET BY MOUTH AT BEDTIME AS NEEDED FOR SLEEP. 90 tablet 0   No current facility-administered medications for this visit.    Allergies:   Ferrous bisglycinate chelate [iron], Mushroom extract complex (obsolete), Na ferric gluc cplx in sucrose, Cymbalta [duloxetine hcl], Hydromorphone , Ondansetron  hcl, Promethazine, Succinylcholine, Buprenorphine hcl, Compazine, Duloxetine, Metoclopramide, Morphine and codeine, Ondansetron , Promethazine hcl, and Tegaderm ag mesh [silver ]    Social History:  The patient  reports that she has never smoked. She has never used smokeless tobacco. She reports that she does not currently use alcohol. She reports that she does not use drugs.   Family History:  The patient's She was adopted. Family history is unknown by patient.    ROS:  Please see the history of present illness.    Physical Exam: Blood pressure 111/75, pulse 90, height 5\' 5"  (1.651 m), weight 164 lb 3.2 oz (74.5 kg), SpO2 96%.       GEN:  mildly obese , middle age female   in no acute distress HEENT: Normal NECK: No JVD; No carotid bruits LYMPHATICS: No lymphadenopathy CARDIAC: RRR ,  2/6 systolic murmur , no diastolic murmur heard  RESPIRATORY:  Clear to auscultation without rales, wheezing or rhonchi  ABDOMEN: Soft, non-tender, non-distended MUSCULOSKELETAL:  No edema;,  s/p left index finger amputation  SKIN: Warm and dry NEUROLOGIC:  Alert and oriented x 3    EKG:          Recent Labs: 03/09/2023: Magnesium  2.0 04/23/2023: TSH 0.508 09/03/2023: ALT 25; B Natriuretic Peptide 43.3 12/07/2023: BUN 28; Creatinine, Ser 1.31; Hemoglobin 9.6; Platelets 370; Potassium 3.6; Sodium 141    Lipid Panel    Component Value Date/Time   CHOL 178 09/22/2020  0935   CHOL 204 (H) 01/23/2018 1225   CHOL 207 (H) 12/08/2013 0823   TRIG 253 (H) 09/22/2020 0935   TRIG 96 12/08/2013 0823   HDL 44 09/22/2020 0935   HDL 60 01/23/2018 1225   HDL 57 12/08/2013 0823   CHOLHDL 4.0 09/22/2020 0935   VLDL 51 (H) 09/22/2020 0935   LDLCALC 83 09/22/2020 0935   LDLCALC 107 (H) 01/23/2018 1225   LDLCALC 131 (H) 12/08/2013 0823      Wt Readings from Last 3 Encounters:  03/02/24 164 lb 3.2 oz (74.5 kg)  02/27/24 165 lb (74.8 kg)  02/25/24 166 lb (75.3 kg)      Other studies Reviewed: Additional studies/ records that were reviewed today include: . Review of the above records demonstrates:    ASSESSMENT AND PLAN:  -  chest pain / tightness :     - dyspnea:  cath from Dec. 2023 shows no evidence of pulmonary artery HTN:     She has normal LV function  I would continue her entresto  for the time being  She has lots of other issues        She remains on home O2. Her poor lung function seems to be her main issue at this point      - Palpitations/premature ventricular contractions -   Stable for now    -  history of Hodgkin's lymphoma-status post mantle radiation   - History of breast cancer-s/p reconstruction    4. History of cervical cancer  5.  History of pituitary tumor-  6. History of thyroidectomy -       7. Raynaud's phenomenon  8. Appendectomy  9. History chest pains-  . 10.   history of adrenal insufficiency  -       Current medicines are reviewed at length with the patient today.  The patient does not have concerns regarding medicines.  The following changes have been made:  no change  Disposition:    will follow up in 2 months    Signed, Ahmad Alert, MD  03/02/2024 11:31 AM    [facility] [address] [place], [place], [place]  [address] Phone: [phone]; Fax: [phone]

## 2024-03-02 ENCOUNTER — Other Ambulatory Visit (HOSPITAL_COMMUNITY): Payer: Self-pay | Admitting: Student

## 2024-03-02 ENCOUNTER — Telehealth: Admitting: Adult Health

## 2024-03-02 ENCOUNTER — Other Ambulatory Visit: Payer: Self-pay | Admitting: Student

## 2024-03-02 ENCOUNTER — Ambulatory Visit: Attending: Cardiovascular Disease | Admitting: Cardiovascular Disease

## 2024-03-02 ENCOUNTER — Ambulatory Visit (HOSPITAL_COMMUNITY)
Admission: RE | Admit: 2024-03-02 | Discharge: 2024-03-02 | Disposition: A | Discharge status: Home / Self Care | Source: Ambulatory Visit | Attending: Student | Admitting: Student

## 2024-03-02 VITALS — BP 111/75 | HR 90 | Ht 65.0 in | Wt 164.2 lb

## 2024-03-02 DIAGNOSIS — T8149XA Infection following a procedure, other surgical site, initial encounter: Secondary | ICD-10-CM

## 2024-03-02 DIAGNOSIS — R0600 Dyspnea, unspecified: Secondary | ICD-10-CM | POA: Diagnosis not present

## 2024-03-02 DIAGNOSIS — R002 Palpitations: Secondary | ICD-10-CM

## 2024-03-02 NOTE — Progress Notes (Signed)
 Patient is well known to IR, recently underwent left sided port placement on 5/27 then port removal in 5/29 due to infected site. The port pocket was packed with iodoform gauze.  Patient seen at [facility] IR for port site check.  The gauze was removed w/o difficulty, the pocket appeared clean. The pocket was packed with Hydrogel and dressing was placed.   Patient will be scheduled for port site check and Hydrogel re-packing next Monday 6/9 and again on 6/13 due to upcoming vacation from 6/14-21.    Rochell Puett H Abner Ardis PA-C 03/02/2024 2:14 PM

## 2024-03-02 NOTE — Patient Instructions (Signed)
 Follow-Up: At [facility], you and your health needs are our priority.  As part of our continuing mission to provide you with exceptional heart care, our providers are all part of one team.  This team includes your primary Cardiologist (physician) and Advanced Practice Providers or APPs (Physician Assistants and Nurse Practitioners) who all work together to provide you with the care you need, when you need it.  Your next appointment:   1 year(s)  Provider:   Ahmad Alert, MD

## 2024-03-03 ENCOUNTER — Telehealth: Payer: Self-pay | Admitting: Adult Health

## 2024-03-03 ENCOUNTER — Other Ambulatory Visit: Payer: Self-pay | Admitting: Adult Health

## 2024-03-03 DIAGNOSIS — G8222 Paraplegia, incomplete: Secondary | ICD-10-CM | POA: Diagnosis not present

## 2024-03-03 DIAGNOSIS — F410 Panic disorder [episodic paroxysmal anxiety] without agoraphobia: Secondary | ICD-10-CM

## 2024-03-03 DIAGNOSIS — R911 Solitary pulmonary nodule: Secondary | ICD-10-CM | POA: Diagnosis not present

## 2024-03-03 NOTE — Telephone Encounter (Addendum)
 Pt's husband reporting they lost her alprazolam . It was LF 5/29. I called the pharmacy just to verify that it was picked up. Husband thinks that she got multiple bags of meds and that it got accidentally thrown out. Pharmacist said he is not a fan of dual benzos and wants explanation for this. He reports being hospice is rather nebulous and is not enough information to justify this.

## 2024-03-03 NOTE — Telephone Encounter (Signed)
 Husband richard lvm that they picked up the xanax  last week. However tami has lost the bottle and doesn't know what to do. Please call richard at [phone]

## 2024-03-04 ENCOUNTER — Other Ambulatory Visit: Payer: Self-pay | Admitting: Gastroenterology

## 2024-03-04 ENCOUNTER — Other Ambulatory Visit: Payer: Self-pay | Admitting: Student

## 2024-03-04 ENCOUNTER — Other Ambulatory Visit: Payer: Self-pay | Admitting: Cardiovascular Disease

## 2024-03-04 ENCOUNTER — Ambulatory Visit: Admitting: Gastroenterology

## 2024-03-04 ENCOUNTER — Telehealth: Payer: Self-pay | Admitting: Gastroenterology

## 2024-03-04 DIAGNOSIS — T8149XA Infection following a procedure, other surgical site, initial encounter: Secondary | ICD-10-CM

## 2024-03-04 NOTE — Telephone Encounter (Signed)
 Provider and CMA made aware of cancelled appointment. MyChart message sent to patient.

## 2024-03-04 NOTE — Telephone Encounter (Signed)
 Patient called to advise us  that she will not be able to come to her appointment scheduled for today at 10 AM. Patient stated that she has been to the ER and they stated that her wounds that she has had from surgeries she had done are infected and as of right now they have packed her wounds. Patient stated that she has a fever of 101 after taking tylenol  1 hour ago. Patient stated that she would like Dr. Karene Oto know that she is very sorry for not being able to make her appointment today. Patient was reschedule for August the 13 th and would like to see if the nurse has anything sooner. Patient is requesting a call back. Please advise.

## 2024-03-06 ENCOUNTER — Telehealth: Payer: Self-pay | Admitting: Physician Assistant

## 2024-03-06 NOTE — Telephone Encounter (Signed)
 Patient called today with concerns regarding her PICC  She states she is not longer using it for chemotherapy and it is quite bothersome.  I explained she could come today for removal however she did not have transportation.  She comes Monday for wound care and we can remove the PICC at that time.  She agrees.   [name] Susan Kostelecky PA-C 03/06/2024 11:19 AM

## 2024-03-08 ENCOUNTER — Other Ambulatory Visit: Payer: Self-pay | Admitting: Gastroenterology

## 2024-03-09 ENCOUNTER — Ambulatory Visit (HOSPITAL_COMMUNITY)
Admission: RE | Admit: 2024-03-09 | Discharge: 2024-03-09 | Disposition: A | Discharge status: Home / Self Care | Source: Ambulatory Visit | Attending: Student | Admitting: Student

## 2024-03-09 ENCOUNTER — Other Ambulatory Visit: Payer: Self-pay

## 2024-03-09 ENCOUNTER — Other Ambulatory Visit (HOSPITAL_COMMUNITY): Payer: Self-pay

## 2024-03-09 DIAGNOSIS — T8149XA Infection following a procedure, other surgical site, initial encounter: Secondary | ICD-10-CM

## 2024-03-09 NOTE — Procedures (Signed)
 Patient is well known to IR, recently underwent left sided port placement on 5/27 then port removal in 5/29 due to infected site. The port pocket was packed with iodoform gauze.  6/2: Patient seen at [facility] IR for port site check.  The gauze was removed w/o difficulty, the pocket appeared clean. The pocket was packed with Hydrogel and dressing was placed.   Patient will be scheduled for port site check and Hydrogel re-packing next Monday 6/9 and again on 6/13 due to upcoming vacation from 6/14-21.   6/9:  Patient seen at [facility] IR for port site check.  The gauze was removed w/o difficulty, the pocket appeared much smaller than last week, remains clean. The pocket was packed with Hydrogel and dressing was placed.  Discussed about PICC removal, patient would like to wait till next port site check appointment on 6/12.    Spirit Wernli H Ragan Duhon PA-C 03/09/2024 12:43 PM

## 2024-03-10 ENCOUNTER — Encounter: Payer: Self-pay | Admitting: Cardiovascular Disease

## 2024-03-10 ENCOUNTER — Other Ambulatory Visit (HOSPITAL_COMMUNITY): Payer: Self-pay

## 2024-03-11 ENCOUNTER — Other Ambulatory Visit: Payer: Self-pay

## 2024-03-11 ENCOUNTER — Other Ambulatory Visit (HOSPITAL_COMMUNITY): Payer: Self-pay

## 2024-03-11 ENCOUNTER — Telehealth: Payer: Self-pay | Admitting: Adult Health

## 2024-03-11 DIAGNOSIS — F410 Panic disorder [episodic paroxysmal anxiety] without agoraphobia: Secondary | ICD-10-CM

## 2024-03-11 MED ORDER — CLONAZEPAM 1 MG PO TABS: ORAL_TABLET | ORAL | 0 refills | Status: DC

## 2024-03-11 MED ORDER — BUMETANIDE 1 MG PO TABS: 1.0000 mg | ORAL_TABLET | Freq: Two times a day (BID) | ORAL | 3 refills | Status: DC

## 2024-03-11 NOTE — Telephone Encounter (Signed)
 Pended clonazepam  RF.

## 2024-03-11 NOTE — Telephone Encounter (Signed)
 Pt called at 10:56a stating that she and Bonnell Butcher discussed increasing the Clonazepam  from 2 times a day to 3 times a day and not taking the Alprazolam .  Pt said she will be leaving in 2 days to go out the country and will be out of meds 2 days in to their vacation, therefore she is requesting a refill before they leave.  [id number], [place] - [address], [place] [address] Phone: [phone]  Fax: [phone]   No upcoming appt scheduled.

## 2024-03-12 ENCOUNTER — Ambulatory Visit (HOSPITAL_COMMUNITY)
Admission: RE | Admit: 2024-03-12 | Discharge: 2024-03-12 | Disposition: A | Discharge status: Home / Self Care | Source: Ambulatory Visit | Attending: Student | Admitting: Student

## 2024-03-12 ENCOUNTER — Other Ambulatory Visit: Payer: Self-pay | Admitting: Adult Health

## 2024-03-12 ENCOUNTER — Encounter (HOSPITAL_COMMUNITY): Payer: Self-pay | Admitting: Radiology

## 2024-03-12 ENCOUNTER — Other Ambulatory Visit: Payer: Self-pay | Admitting: Student

## 2024-03-12 DIAGNOSIS — T8149XA Infection following a procedure, other surgical site, initial encounter: Secondary | ICD-10-CM

## 2024-03-12 DIAGNOSIS — Z4801 Encounter for change or removal of surgical wound dressing: Secondary | ICD-10-CM | POA: Insufficient documentation

## 2024-03-12 DIAGNOSIS — T80219D Unspecified infection due to central venous catheter, subsequent encounter: Secondary | ICD-10-CM | POA: Insufficient documentation

## 2024-03-12 DIAGNOSIS — Y839 Surgical procedure, unspecified as the cause of abnormal reaction of the patient, or of later complication, without mention of misadventure at the time of the procedure: Secondary | ICD-10-CM | POA: Insufficient documentation

## 2024-03-12 HISTORY — PX: IR RADIOLOGIST EVAL & MGMT: IMG5224

## 2024-03-12 HISTORY — PX: IR PATIENT EVAL TECH 0-60 MINS: IMG5564

## 2024-03-12 NOTE — Telephone Encounter (Signed)
 Pt called and said that [facility] will need to speak with someone to approve the early refill. Please call to confirm.

## 2024-03-12 NOTE — Progress Notes (Signed)
 Wound check of left port removal. Site looks great, no residual erythema Wound granulating well. Residual hydrogel removed. New hydrogel placed in wound bed. Covered with tefla and Tegaderm.  Pt also elects to have Rt UE PICC removed. PICC removed without complication.   Pt leaving for trip tomorrow. Gave wound care instructions and additional supplies. Pt will come back for wound recheck upon return from her trip.  Prudence Brown PA-C Interventional Radiology 03/12/2024 1:27 PM

## 2024-03-12 NOTE — Telephone Encounter (Signed)
 Gina ok'd early RF. Madelyne Schiff, pharmacist, was agreeable to fill it today, though insurance may not cover. It will also push her next RF out by 10 days.

## 2024-03-12 NOTE — Procedures (Signed)
 Pt came in to hospital today, amongst other reasons, to have her PICC line removed. The tegederm and gauze bandage was removed from the upper right arm. The PICC was removed. Light pressure was held to stop oozing at the insertion site. A new gauze and tegederm bandage was placed over the insertion site. The PICC was fully intact upon removal.

## 2024-03-12 NOTE — Telephone Encounter (Signed)
 Called patient and no answer. Did not leave a message since they should get an alert from pharmacy.

## 2024-03-13 ENCOUNTER — Other Ambulatory Visit (HOSPITAL_COMMUNITY)

## 2024-03-13 ENCOUNTER — Encounter (HOSPITAL_COMMUNITY): Payer: Self-pay

## 2024-03-13 ENCOUNTER — Ambulatory Visit (HOSPITAL_COMMUNITY): Admission: RE | Admit: 2024-03-13 | Source: Ambulatory Visit

## 2024-03-21 DIAGNOSIS — J440 Chronic obstructive pulmonary disease with acute lower respiratory infection: Secondary | ICD-10-CM | POA: Diagnosis not present

## 2024-03-21 DIAGNOSIS — J4550 Severe persistent asthma, uncomplicated: Secondary | ICD-10-CM | POA: Diagnosis not present

## 2024-03-23 ENCOUNTER — Ambulatory Visit (HOSPITAL_COMMUNITY)
Admission: RE | Admit: 2024-03-23 | Discharge: 2024-03-23 | Disposition: A | Discharge status: Home / Self Care | Source: Ambulatory Visit | Attending: Student | Admitting: Student

## 2024-03-23 ENCOUNTER — Other Ambulatory Visit: Payer: Self-pay

## 2024-03-23 DIAGNOSIS — T8149XA Infection following a procedure, other surgical site, initial encounter: Secondary | ICD-10-CM

## 2024-03-23 NOTE — Progress Notes (Signed)
 Pt presents to IR department for scheduled L chest wound (from port infection) hydrogel packing. This wound continues to improve, no new swelling or erythema around wound. Serous drainage noted.   Irrigated with NS, dried, hydrogel packing introduced into wound. Well tolerated. Redressed.  Patient to return Friday for next wound check.    During this procedure, patient reports that while in [place] last week a nurse injected benadryl  into a vein in her foot to help her with nausea and that since then she has noticed increased foot swelling, bruising. Area  to L foot is not blanchable, is non tender, and has a central area of intact but gray/black tissue. There is a blistered area at the border. She is not feeling ill otherwise. Advised patient that IR department would not be able to meet the care needs for this wound, but that I am concerned that there was extravasation of the benadryl  causing necrosis of the tissue. She has an extensive medical history including breast cancer and previously required amputation of a finger from thrombophlebitis.  She states that she would like to go to the ER but would like IR RN to place IV as she has poor venous access. Agreed to this granted patient allows staff to bring her to the ER check in area.   Performed by Laymon Coast, NP

## 2024-03-23 NOTE — Progress Notes (Signed)
 IV staff attempted and was unsuccessful with IV placement. Patient tells staff that she does not want to wait in the ED waiting room due to concern for chronic health issues and would rather wait in IR for IV team; we are unable to accommodate her request to wait in IR for an ED bed to be available.  Patient asked to make decision for staff to either assist her to the ED or for her to be discharged from the department home. Stressed to the patient need for follow up with her PCP for foot wound as soon as possible.   Patient tells staff that she feels that they do not care about her. Again encouraged patient that staff in IR care deeply about her care but that we cannot provide the care she needs in this procedural area. Patient is A&Ox4, voices that she has made the decision to go home and follow up with her doctor.   She will follow up with IR in approximately 1 week for port site reassessment.

## 2024-03-25 ENCOUNTER — Emergency Department (HOSPITAL_COMMUNITY)
Admission: EM | Admit: 2024-03-25 | Discharge: 2024-03-26 | Disposition: A | Discharge status: Home / Self Care | Attending: Emergency Medicine | Admitting: Emergency Medicine

## 2024-03-25 ENCOUNTER — Emergency Department (HOSPITAL_COMMUNITY)

## 2024-03-25 ENCOUNTER — Encounter (HOSPITAL_COMMUNITY): Payer: Self-pay

## 2024-03-25 DIAGNOSIS — I130 Hypertensive heart and chronic kidney disease with heart failure and stage 1 through stage 4 chronic kidney disease, or unspecified chronic kidney disease: Secondary | ICD-10-CM | POA: Insufficient documentation

## 2024-03-25 DIAGNOSIS — Z8541 Personal history of malignant neoplasm of cervix uteri: Secondary | ICD-10-CM | POA: Diagnosis not present

## 2024-03-25 DIAGNOSIS — L03115 Cellulitis of right lower limb: Secondary | ICD-10-CM | POA: Diagnosis not present

## 2024-03-25 DIAGNOSIS — M79671 Pain in right foot: Secondary | ICD-10-CM | POA: Diagnosis not present

## 2024-03-25 DIAGNOSIS — I10 Essential (primary) hypertension: Secondary | ICD-10-CM | POA: Diagnosis not present

## 2024-03-25 DIAGNOSIS — Z853 Personal history of malignant neoplasm of breast: Secondary | ICD-10-CM | POA: Diagnosis not present

## 2024-03-25 DIAGNOSIS — Z79899 Other long term (current) drug therapy: Secondary | ICD-10-CM | POA: Insufficient documentation

## 2024-03-25 DIAGNOSIS — N183 Chronic kidney disease, stage 3 unspecified: Secondary | ICD-10-CM | POA: Insufficient documentation

## 2024-03-25 DIAGNOSIS — M7731 Calcaneal spur, right foot: Secondary | ICD-10-CM | POA: Diagnosis not present

## 2024-03-25 DIAGNOSIS — Z85850 Personal history of malignant neoplasm of thyroid: Secondary | ICD-10-CM | POA: Diagnosis not present

## 2024-03-25 DIAGNOSIS — R609 Edema, unspecified: Secondary | ICD-10-CM | POA: Diagnosis not present

## 2024-03-25 DIAGNOSIS — L0390 Cellulitis, unspecified: Secondary | ICD-10-CM | POA: Diagnosis not present

## 2024-03-25 DIAGNOSIS — I509 Heart failure, unspecified: Secondary | ICD-10-CM | POA: Insufficient documentation

## 2024-03-25 DIAGNOSIS — S99921A Unspecified injury of right foot, initial encounter: Secondary | ICD-10-CM | POA: Diagnosis not present

## 2024-03-25 DIAGNOSIS — Z7982 Long term (current) use of aspirin: Secondary | ICD-10-CM | POA: Diagnosis not present

## 2024-03-25 DIAGNOSIS — R509 Fever, unspecified: Secondary | ICD-10-CM | POA: Diagnosis not present

## 2024-03-25 LAB — CBC WITH DIFFERENTIAL/PLATELET
Abs Immature Granulocytes: 0.05 10*3/uL (ref 0.00–0.07)
Basophils Absolute: 0.1 10*3/uL (ref 0.0–0.1)
Basophils Relative: 1 %
Eosinophils Absolute: 0.2 10*3/uL (ref 0.0–0.5)
Eosinophils Relative: 2 %
HCT: 30.9 % — ABNORMAL LOW (ref 36.0–46.0)
Hemoglobin: 9.2 g/dL — ABNORMAL LOW (ref 12.0–15.0)
Immature Granulocytes: 0 %
Lymphocytes Relative: 25 %
Lymphs Abs: 3.1 10*3/uL (ref 0.7–4.0)
MCH: 23 pg — ABNORMAL LOW (ref 26.0–34.0)
MCHC: 29.8 g/dL — ABNORMAL LOW (ref 30.0–36.0)
MCV: 77.3 fL — ABNORMAL LOW (ref 80.0–100.0)
Monocytes Absolute: 1.2 10*3/uL — ABNORMAL HIGH (ref 0.1–1.0)
Monocytes Relative: 10 %
Neutro Abs: 7.6 10*3/uL (ref 1.7–7.7)
Neutrophils Relative %: 62 %
Platelets: 393 10*3/uL (ref 150–400)
RBC: 4 MIL/uL (ref 3.87–5.11)
RDW: 18.4 % — ABNORMAL HIGH (ref 11.5–15.5)
WBC: 12.3 10*3/uL — ABNORMAL HIGH (ref 4.0–10.5)
nRBC: 0 % (ref 0.0–0.2)

## 2024-03-25 LAB — COMPREHENSIVE METABOLIC PANEL WITH GFR
ALT: 33 U/L (ref 0–44)
AST: 28 U/L (ref 15–41)
Albumin: 3.7 g/dL (ref 3.5–5.0)
Alkaline Phosphatase: 58 U/L (ref 38–126)
Anion gap: 11 (ref 5–15)
BUN: 29 mg/dL — ABNORMAL HIGH (ref 6–20)
CO2: 27 mmol/L (ref 22–32)
Calcium: 9.8 mg/dL (ref 8.9–10.3)
Chloride: 99 mmol/L (ref 98–111)
Creatinine, Ser: 1.31 mg/dL — ABNORMAL HIGH (ref 0.44–1.00)
GFR, Estimated: 49 mL/min — ABNORMAL LOW (ref >60)
Glucose, Bld: 112 mg/dL — ABNORMAL HIGH (ref 70–99)
Potassium: 3.5 mmol/L (ref 3.5–5.1)
Sodium: 137 mmol/L (ref 135–145)
Total Bilirubin: 0.4 mg/dL (ref 0.0–1.2)
Total Protein: 7.5 g/dL (ref 6.5–8.1)

## 2024-03-25 LAB — I-STAT CG4 LACTIC ACID, ED: Lactic Acid, Venous: 1.3 mmol/L (ref 0.5–1.9)

## 2024-03-25 MED ORDER — LORAZEPAM 2 MG/ML IJ SOLN
1.0000 mg | Freq: Once | INTRAMUSCULAR | Status: AC
  Administered 2024-03-25: 1 mg via INTRAVENOUS
  Filled 2024-03-25: qty 1

## 2024-03-25 MED ORDER — DOXYCYCLINE HYCLATE 100 MG PO CAPS: 100.0000 mg | ORAL_CAPSULE | Freq: Two times a day (BID) | ORAL | 0 refills | Status: DC

## 2024-03-25 MED ORDER — FENTANYL CITRATE PF 50 MCG/ML IJ SOSY
50.0000 ug | PREFILLED_SYRINGE | Freq: Once | INTRAMUSCULAR | Status: AC
  Administered 2024-03-25: 50 ug via INTRAVENOUS
  Filled 2024-03-25: qty 1

## 2024-03-25 MED ORDER — CLINDAMYCIN PHOSPHATE 600 MG/50ML IV SOLN
600.0000 mg | Freq: Once | INTRAVENOUS | Status: AC
  Administered 2024-03-25: 600 mg via INTRAVENOUS
  Filled 2024-03-25: qty 50

## 2024-03-25 NOTE — ED Provider Notes (Addendum)
 [place] EMERGENCY DEPARTMENT AT [facility] Provider Note   CSN: [id number] Arrival date & time: 03/25/24  1946     Patient presents with: Foot Injury (Right)   [name] is a 53 y.o. female.   Patient with metastatic cancer primary unknown thought maybe long.  Just receiving palliative care now not receiving any more active interventions.  Patient dropped a glass table on her top of her right foot on Friday.  Since that time has been increased swelling and redness more redness became present today.  She went to orthopedic urgent care they were concerned about cellulitis apparently did an x-ray some question of a fracture but I cannot see the results of that x-ray.  And they sent her here.  Patient has been dressing the wound on top of the right foot with Silvadene .  Patient denies any fevers.  Temp here is 98.5 pulse 97 respirations 20 blood pressure 138/71 oxygen  sats 100% on room air.  Patient states she is got an allergy to cephalosporins.  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  Supplemental oxygen  dependent.  Nonischemic cardiomyopathy appendicitis necrotizing fasciitis I think of her right middle finger that started with a cellulitis.  Hodgkin lymphoma breast cancer thyroid  cancer cervical cancer chronic kidney disease stage III ovarian tumor benign bilaterally.  Patient is never used tobacco products.  There was no twisting or fall related with the right foot injury.       Prior to Admission medications   Medication Sig Start Date End Date Taking? Authorizing Provider  albuterol  (PROVENTIL ) (2.5 MG/3ML) 0.083% nebulizer solution INHALE 3 ML BY NEBULIZATION EVERY 6 HOURS AS NEEDED FOR  WHEEZING OR SHORTNESS OF BREATH 12/13/23   Kassie Acquanetta Bradley, MD  albuterol  (VENTOLIN  HFA) [phone] Base) MCG/ACT inhaler Inhale 2 puffs into the lungs every 6 (six) hours as needed for wheezing or shortness of breath. 01/08/24   Kassie Acquanetta Bradley, MD  ALPRAZolam  (XANAX ) 1 MG tablet 1 tablet daily and 1 tablet PRN no more than 10 days a month. 02/27/24   Mozingo, Regina Nattalie, NP  amoxicillin -clavulanate (AUGMENTIN ) 875-125 MG tablet Take 1 tablet by mouth 2 (two) times daily. 02/26/24   Sherwood Drivers, PA-C  aspirin  EC 81 MG tablet Take 1 tablet (81 mg total) by mouth daily. Swallow whole. Patient taking differently: Take 81 mg by mouth at bedtime. Chewable 07/16/22   Nahser, Aleene PARAS, MD  Bismuth Tribromoph-Petrolatum (XEROFORM OCCLUSIVE GAUZE STRIP) PADS Apply 1 each topically as directed. 09/17/22   Tobie Yetta CHRISTELLA, MD  bumetanide  (BUMEX ) 1 MG tablet Take 1 tablet (1 mg total) by mouth 2 (two) times daily. 03/11/24   Nahser, Aleene PARAS, MD  buPROPion  (WELLBUTRIN  XL) 150 MG 24 hr tablet TAKE 1 TABLET BY MOUTH EVERY DAY 01/28/24   Mozingo, Regina Nattalie, NP  clonazePAM  (KLONOPIN ) 1 MG tablet Take 1 mg tablet TID PRN and 1 extra for severe anxiety up to 10 a month. 03/11/24   Mozingo, Regina Nattalie, NP  dexlansoprazole  (DEXILANT ) 60 MG capsule Take 1 capsule (60 mg total) by mouth daily. 12/19/23   May, Deanna J, NP  diclofenac  (VOLTAREN ) 50 MG EC tablet Take 50 mg by mouth 2 (two) times daily. *as needed for migraine, Can repeat if needed* 07/23/23   [provider]  diphenhydrAMINE  (BENADRYL ) 12.5 MG/5ML elixir Take 10 mLs (25 mg total) by mouth every 6 (six) hours as needed (nausea). 09/17/22   Tobie Yetta HERO, MD  EMGALITY  120 MG/ML SOSY Inject 120 mg into the skin every 28 (twenty-eight) days.    [provider]  EPINEPHRINE  0.3 mg/0.3 mL IJ SOAJ injection INJECT 0.3 MG INTO THE MUSCLE AS NEEDED FOR ANAPHYLAXIS. 04/25/23   Kassie Acquanetta Bradley, MD  famotidine  (PEPCID ) 40 MG/5ML  suspension Take 2.5 mLs (20 mg total) by mouth daily. 03/30/23 03/02/24  Curatolo, Adam, DO  famotidine  (PEPCID ) 40 MG/5ML suspension TAKE 1.3 MLS (10.4 MG TOTAL) BY MOUTH DAILY - DISCARD BOTTLE AFTER 30 DAYS FROM PICKUP 02/21/24   Tobie Eldora NOVAK, MD  FLORASTOR 250 MG capsule Take 250 mg by mouth 2 (two) times daily. Mid Morning and Mid Afternoon    [provider]  FLUoxetine  (PROZAC ) 20 MG capsule Take 1 capsule (20 mg total) by mouth daily. 02/27/24   Mozingo, Regina Nattalie, NP  fluticasone -salmeterol (ADVAIR HFA) 230-21 MCG/ACT inhaler Inhale 2 puffs into the lungs 2 (two) times daily. 01/08/24   Kassie Acquanetta Bradley, MD  Heparin  Na, Pork, Lock Flsh PF (BD HEPARIN  POSIFLUSH) 100 UNIT/ML SOLN USE 5 MLS IN PORT A CATH ONCE DAILY AFTER MEDICATION ADMINISTRATION AS A HEPLOCK AS DIRECTED. 01/06/24     hydrocortisone  (CORTEF ) 10 MG tablet Take 1-2 tablets (10-20 mg total) by mouth See admin instructions. Take 20 mg in the am and 10mg  in the evening. Take after completing prednisone  Patient taking differently: Take 10-20 mg by mouth See admin instructions. Take 20 mg in the am at 10 am and 10 mg in the afternoon. Take after completing prednisone  09/29/22   Tobie Yetta HERO, MD  hyoscyamine  (LEVSIN AMIEL) 0.125 MG SL tablet Place 1 tablet (0.125 mg total) under the tongue every 6 (six) hours as needed (esophageal spasm). Up to 1.25 mg daily 12/22/23   Harris, Abigail, PA-C  lamoTRIgine  (LAMICTAL ) 200 MG tablet TAKE 1 TABLET BY MOUTH EVERYDAY AT BEDTIME 02/27/24   Mozingo, Regina Nattalie, NP  lamoTRIgine  (LAMICTAL ) 25 MG tablet Take two tablets every morning. 02/27/24   Mozingo, Regina Nattalie, NP  Lancets (ONETOUCH DELICA PLUS [place]) MISC 3 (three) times daily. for testing 06/12/21   [provider]  lidocaine  (XYLOCAINE ) 2 % solution Use as directed 10 mLs in the mouth or throat every 4 (four) hours as needed (for esophageal pain and pain with swallowing.). 12/22/23   Harris, Abigail, PA-C  LORazepam   (ATIVAN ) 0.5 MG tablet Take 1 tablet (0.5 mg total) by mouth 3 (three) times daily as needed for anxiety. Patient taking differently: Place 0.5 mg into feeding tube 3 (three) times daily as needed for anxiety. [employer] For Nausea 02/11/23   Mozingo, Regina Nattalie, NP  nitroGLYCERIN  (NITROSTAT ) 0.4 MG SL tablet DISSOLVE 1 TAB UNDER THE TONGUE EVERY 5 MINUTES AS NEEDED FOR CHEST PAIN. MAX OF 3 DOSES, THEN 911. 07/03/23   Nahser, Aleene PARAS, MD  NURTEC 75 MG TBDP Take 1 tablet by mouth daily as needed. 09/03/23   [provider]  OLANZapine  (ZYPREXA ) 5 MG tablet Take 1 tablet (5 mg total) by mouth at bedtime. 02/27/24   Mozingo, Regina Nattalie, NP  [facility] VERIO test strip 3 (three) times daily. for testing 06/12/21   [provider]  OXYGEN  Inhale 4-5 L/min into the lungs continuous.    [provider]  potassium chloride  (KLOR-CON ) 10 MEQ tablet as needed. 12/01/23  [provider]  PROAIR  HFA 108 (90 Base) MCG/ACT inhaler Inhale 2 puffs into the lungs every 4 (four) hours as needed for wheezing or shortness of breath.    [provider]  QVAR  REDIHALER 80 MCG/ACT inhaler Inhale 1 puff into the lungs 2 (two) times daily. 05/10/23   [provider]  rosuvastatin  (CRESTOR ) 10 MG tablet Take 10 mg by mouth in the morning. Mid morning 02/28/18   [provider]  sacubitril -valsartan  (ENTRESTO ) 24-26 MG TAKE 1 TABLET BY MOUTH TWICE A DAY Patient taking differently: Take 1 tablet by mouth daily. 07/03/23   Nahser, Aleene PARAS, MD  sodium chloride  flush (BD POSIFLUSH) 0.9 % SOLN injection Use as directed for catheter maintenance 02/07/24     Sodium Chloride  Flush (NORMAL SALINE FLUSH) 0.9 % SOLN Use as directed for central venous catheter maintenance 01/31/23     Sodium Chloride  Flush (NORMAL SALINE FLUSH) 0.9 % SOLN Use as directed for catheter maintenance 11/07/23     Sodium Chloride  Flush (NORMAL SALINE FLUSH) 0.9 % SOLN Use 10 mLs as directed for catheter  maintenance. 02/10/24     Sodium Chloride  Flush (NORMAL SALINE FLUSH) 0.9 % SOLN Use as directed for catheter maintenance. 02/10/24     sucralfate  (CARAFATE ) 1 GM/10ML suspension Take 1 g by mouth daily as needed (as directed for ulcers).    [provider]  SYNTHROID  100 MCG tablet Take 1 tablet (100 mcg total) by mouth every morning. Patient taking differently: Take 100 mcg by mouth every morning. * Must be name brand* 10/19/21   Gudena, Vinay, MD  Tiotropium Bromide  Monohydrate (SPIRIVA  RESPIMAT) 1.25 MCG/ACT AERS Inhale 2 puffs into the lungs daily. 01/08/24   Kassie Acquanetta Bradley, MD  topiramate  (TOPAMAX ) 50 MG tablet Take 150 mg by mouth at bedtime. 12/25/19   [provider]  zolpidem  (AMBIEN  CR) 12.5 MG CR tablet TAKE ONE TABLET BY MOUTH AT BEDTIME AS NEEDED FOR SLEEP. 05/14/23       Allergies: Ferrous bisglycinate chelate [iron], Mushroom extract complex (obsolete), Na ferric gluc cplx in sucrose, Cymbalta [duloxetine hcl], Hydromorphone , Ondansetron  hcl, Promethazine, Succinylcholine, Buprenorphine hcl, Compazine, Duloxetine, Metoclopramide, Morphine and codeine, Ondansetron , Promethazine hcl, and Tegaderm ag mesh [silver ]    Review of Systems  Constitutional:  Negative for chills and fever.  HENT:  Negative for ear pain and sore throat.   Eyes:  Negative for pain and visual disturbance.  Respiratory:  Negative for cough and shortness of breath.   Cardiovascular:  Negative for chest pain and palpitations.  Gastrointestinal:  Negative for abdominal pain and vomiting.  Genitourinary:  Negative for dysuria and hematuria.  Musculoskeletal:  Positive for joint swelling. Negative for arthralgias and back pain.  Skin:  Positive for wound. Negative for color change and rash.  Neurological:  Negative for seizures and syncope.  All other systems reviewed and are negative.   Updated Vital Signs BP 125/77   Pulse 92   Temp 98.5 F (36.9 C) (Oral)   Resp 15   Ht 1.651 m (5' 5)    Wt 77.6 kg   LMP  (LMP Unknown)   SpO2 100%   BMI 28.46 kg/m   Physical Exam Vitals and nursing note reviewed.  Constitutional:      General: She is not in acute distress.    Appearance: Normal appearance. She is well-developed. She is not ill-appearing.  HENT:     Head: Normocephalic and atraumatic.   Eyes:     Conjunctiva/sclera: Conjunctivae normal.  Cardiovascular:     Rate and Rhythm: Normal rate and regular rhythm.     Heart sounds: No murmur heard. Pulmonary:     Effort: Pulmonary effort is normal. No respiratory distress.     Breath sounds: Normal breath sounds.  Abdominal:     Palpations: Abdomen is soft.     Tenderness: There is no abdominal tenderness.   Musculoskeletal:        General: Signs of injury present. No swelling.     Cervical back: Neck supple.     Comments: Right foot cap refills intact.  Erythema to the top of the foot and more to the lateral aspect of the foot swelling around the lateral ankle with erythema up to the distal part of the leg.  An abrasion on top of the foot measuring probably about 4 cm x 5 cm with some dried exudate.  No purulent discharge.  Tenderness to palpation kind of around the right ankle area laterally.  No proximal leg tenderness.  No red streaking going up the leg   Skin:    General: Skin is warm and dry.     Capillary Refill: Capillary refill takes less than 2 seconds.   Neurological:     Mental Status: She is alert.   Psychiatric:        Mood and Affect: Mood normal.     (all labs ordered are listed, but only abnormal results are displayed) Labs Reviewed  CULTURE, BLOOD (ROUTINE X 2)  CULTURE, BLOOD (ROUTINE X 2)  CBC WITH DIFFERENTIAL/PLATELET  COMPREHENSIVE METABOLIC PANEL WITH GFR  I-STAT CG4 LACTIC ACID, ED    EKG: EKG Interpretation Date/Time:  Wednesday March 25 2024 19:58:00 EDT Ventricular Rate:  99 PR Interval:  138 QRS Duration:  83 QT Interval:  368 QTC Calculation: 473 R  Axis:   -41  Text Interpretation: Sinus rhythm Consider right atrial enlargement Left axis deviation Abnormal R-wave progression, early transition Artifact in lead(s) I II III aVR aVL aVF Confirmed by Geraldene Hamilton [phone]) on 03/25/2024 7:58:58 PM  Radiology: No results found.   Procedures   Medications Ordered in the ED  clindamycin  (CLEOCIN ) IVPB 600 mg (has no administration in time range)                                    Medical Decision Making Amount and/or Complexity of Data Reviewed Labs: ordered. Radiology: ordered.  Risk Prescription drug management.   Patient with a complicated medical history.  Clearly has cellulitis from a wound to the top of the right foot.  Will have to repeat x-rays.  Will get blood cultures will get lactic acid will get CBC with differential and complete metabolic panel.  Patient supposedly with a cephalosporin allergy not clear but we will go ahead and give her IV clindamycin  in the meantime.  Chart review seems to show a history of non-Hodgkin's lymphoma.  I do see some notes from palliative care.  Patient's CBC white count 12.3 with a good differential.  Hemoglobin 9.2 platelets 393.  The white blood cell count not significantly changed from the past lactic acid was very reassuring at 1.3 do not feel there is any significant serious infection.  Complete metabolic panel GFR 49 creatinine 1.31 not too far off from her baseline.  Blood cultures have been sent and are pending.  X-ray of the foot considerable soft tissue swelling without any acute bony abnormalities  and no evidence of any fracture.  Patient still has good cap refill to the right foot.  Where it is 2 seconds.  Little difficult to feel dorsalis and posterior tib pulse because there is a lot of swelling.  But the cap refill is very reassuring.  Patient received clindamycin  here.  But we will switch her over doxycycline  to take at home.  Patient is requesting a dose of fentanyl  and  Ativan  IV prior to discharge.  Patient will elevate right foot at home.  Hopefully the antibiotics will work.  She will return for any new or worse symptoms.  Final diagnoses:  Cellulitis of right foot    ED Discharge Orders     None          Geraldene Hamilton, MD 03/25/24 2049    Geraldene Hamilton, MD 03/25/24 [id number]    Geraldene Hamilton, MD 03/25/24 [phone]

## 2024-03-25 NOTE — ED Notes (Signed)
 This RN unsuccessful at IV, notified attending Zackowski , STAT IV team consult placed.

## 2024-03-25 NOTE — Discharge Instructions (Signed)
 Elevate the right foot is much as possible.  Take the antibiotic doxycycline  as directed twice a day for 7 days.  We would expect improvement over the next couple days.  Return for any new or worse symptoms redness spreading further up the leg particularly to the knee.  Any fever or chills.

## 2024-03-25 NOTE — ED Triage Notes (Addendum)
 Pt to ED from orthopedic UC via [facility] . Pt c/o right foot pain and swelling x 4 days ago. While pt was on vacation she injured her right foot on table and reports an RN gave her benadryl  injection in right foot. .  Reports over the past 4 days foot was getting better today foot getting worse., why pt went to orthopedic UC, Reports sent to ED for concerns of cellulitis.  . Foot currently bandaged. home o2 user @4 -5L.   No medications given by EMS.   Last VS: 144/84, RR 24, 98%5L

## 2024-03-26 ENCOUNTER — Ambulatory Visit: Admitting: Psychiatry

## 2024-03-26 ENCOUNTER — Other Ambulatory Visit (HOSPITAL_COMMUNITY): Payer: Self-pay | Admitting: Endocrinology

## 2024-03-26 ENCOUNTER — Telehealth (HOSPITAL_COMMUNITY): Payer: Self-pay

## 2024-03-26 DIAGNOSIS — F411 Generalized anxiety disorder: Secondary | ICD-10-CM

## 2024-03-26 DIAGNOSIS — I878 Other specified disorders of veins: Secondary | ICD-10-CM

## 2024-03-26 NOTE — Telephone Encounter (Signed)
 Called to schedule picc, no answer, left vm. AB

## 2024-03-26 NOTE — Progress Notes (Signed)
     Received message after hours re: patient at hospital. Responded on phone. Today's outpt mental health appt canceled.

## 2024-03-30 ENCOUNTER — Ambulatory Visit (INDEPENDENT_AMBULATORY_CARE_PROVIDER_SITE_OTHER): Admitting: Psychiatry

## 2024-03-30 DIAGNOSIS — F331 Major depressive disorder, recurrent, moderate: Secondary | ICD-10-CM

## 2024-03-30 LAB — CULTURE, BLOOD (ROUTINE X 2): Culture: NO GROWTH

## 2024-03-30 NOTE — Progress Notes (Signed)
 [facility] Counselor/Therapist Progress Note  Patient ID: [name], MRN: [id number],    Date: 03/30/2024  Time Spent: 50 minutes   Treatment Type: Individual Therapy  Virtual Visit via Telehealth Note Connected with patient by a telemedicine/telehealth application, with their informed consent, and verified patient privacy and that I am speaking with the correct person using two identifiers. I discussed the limitations, risks, security and privacy concerns of performing psychotherapy and the availability of in person appointments. I also discussed with the patient that there may be a patient responsible charge related to this service. The patient expressed understanding and agreed to proceed. I discussed the treatment planning with the patient. The patient was provided an opportunity to ask questions and all were answered. The patient agreed with the plan and demonstrated an understanding of the instructions. The patient was advised to call  our office if  symptoms worsen or feel they are in a crisis state and need immediate contact.   Therapist Location: office Patient Location: home  Reported Symptoms: anxiety decreased, depression, fear, hate, frustrations with myself and others    Mental Status Exam:  Appearance:   Casual     Behavior:  Some motivation, frustration  Motor:  Affected by her health issues  Speech/Language:   Clear and Coherent  Affect:  Depressed and anxious  Mood:  anxious, depressed, and irritable  Thought process:  goal directed  Thought content:    Rumination  Sensory/Perceptual disturbances:    WNL  Orientation:  oriented to person, place, time/date, situation, day of week, month of year, year, and stated date of March 30, 2024  Attention:  Fair, sometimes poor   Concentration:  Fair  Memory:  Some memory issues  Fund of knowledge:   Good and Fair  Insight:    Good and Fair  Judgment:   Good  Impulse Control:  Good   Risk  Assessment: Danger to Self:  No Self-injurious Behavior: No Danger to Others: No Duty to Warn:no Physical Aggression / Violence:No  Access to Firearms a concern: No  Gang Involvement:No   Subjective:  Patient today reporting her symptoms to be: Anxiety, depression (stronger), frustrations with others and self, hate.Denies any SI or HI.  Continues to talk through and work on these symptoms today especially depression, anxiety, frustration, and anger. Just returned from [place] trip and patient made it ok physically. Has had some recently arguments within family which she processed and shared her feelings of anger and frustration. Looking at ways of better managing her anger and frustration, and especially her anger as it seems to lead to some hate per her report. Encouraged her venting of some negativity and anger today towards some other in her life and some within herself. Was more calm by end of session and able to focus on upcoming surgery on her foot and getting through that surgery, hoping to heal and move forward but also feels there is some uncertainty with her surgery but trying to not assume negatives at this point. Feeling noticeably less tense by end of session. Will connect with patient again after her foot surgery.   Interventions: Cognitive Behavioral Therapy and Ego-Supportive Long-term goal:  Patient will confront her anxiety, fears, anger reported re: her illness and use time in therapy and beyond to work on this, while receiving feedback and support from therapist. Help her determine what she feels is realistic for her considering her health challenges and difficult prognosis.  Short-term goal: Participate in  therapy and initiate communication of needs and desires at this point in her struggle physically.  Strategies: Share and talk through anxiety, anger, and depression that are experienced and work to find and use helpful coping tools to better manage those  feelings.   Diagnosis:   ICD-10-CM   1. Major depressive disorder, recurrent episode, moderate (HCC)  F33.1      Plan: Patient working in session today focusing further on her anxiety and strong feelings expressed of anger, hate, frustration, and depression, mostly targeted at her husband.  Very actively worked on these feelings including openly expressing them and actively working through them and trying to release them, rather than staying locked in to these feelings that feed her depression and anxiety.  She was very expressive and working through some situations within the home and did seem more emotionally relieved, much less angry, more calm, and actually able to note a couple of positives for herself.  Alexanderia Gorby has made some progress, although more recently has been struggling again, did some really good work in session today as she continues to work with goal-directed behaviors and efforts to keep moving in a more positive direction as she tries to manage the challenges of her physical and emotional health, and to wisely use the time she has in positive ways that can feel helpful and supportive of patient.  Goal review and progress/challenges noted with patient.  Next appointment within 2 weeks.   Barnie Bunde, LCSW

## 2024-03-31 ENCOUNTER — Ambulatory Visit (HOSPITAL_COMMUNITY)
Admission: RE | Admit: 2024-03-31 | Discharge: 2024-03-31 | Disposition: A | Discharge status: Home / Self Care | Source: Ambulatory Visit

## 2024-03-31 ENCOUNTER — Ambulatory Visit (HOSPITAL_COMMUNITY)
Admission: RE | Admit: 2024-03-31 | Discharge: 2024-03-31 | Disposition: A | Discharge status: Home / Self Care | Source: Ambulatory Visit | Attending: Endocrinology | Admitting: Endocrinology

## 2024-03-31 ENCOUNTER — Other Ambulatory Visit: Payer: Self-pay

## 2024-03-31 DIAGNOSIS — I878 Other specified disorders of veins: Secondary | ICD-10-CM | POA: Insufficient documentation

## 2024-03-31 DIAGNOSIS — Z452 Encounter for adjustment and management of vascular access device: Secondary | ICD-10-CM | POA: Diagnosis not present

## 2024-03-31 DIAGNOSIS — T8149XA Infection following a procedure, other surgical site, initial encounter: Secondary | ICD-10-CM

## 2024-03-31 HISTORY — PX: IR RADIOLOGIST EVAL & MGMT: IMG5224

## 2024-03-31 MED ORDER — LIDOCAINE HCL 1 % IJ SOLN
20.0000 mL | Freq: Once | INTRAMUSCULAR | Status: AC
  Administered 2024-03-31: 10 mL

## 2024-03-31 MED ORDER — HEPARIN SOD (PORK) LOCK FLUSH 100 UNIT/ML IV SOLN
INTRAVENOUS | Status: AC
  Filled 2024-03-31: qty 5

## 2024-03-31 MED ORDER — LIDOCAINE HCL 1 % IJ SOLN
INTRAMUSCULAR | Status: AC
  Filled 2024-03-31: qty 20

## 2024-03-31 NOTE — Procedures (Signed)
 PROCEDURE SUMMARY:  Successful placement of single lumen PICC line to left brachial vein. Length 41 cm Tip at lower SVC/RA No complications PICC capped Ready for use. EBL = trace  Please see full dictation in Imaging section for details.   [place] Jasdeep Dejarnett, NP 03/31/2024 11:25 AM

## 2024-03-31 NOTE — Progress Notes (Signed)
 Referring Physician(s): Balan,Bindubal  Supervising Physician: Jenna Hacker  Patient Status:  [facility] - In-pt  Chief Complaint:  Request for wound management of L chest wound s/p port removal 5/29  Subjective:  Patient reports that exudate from the wound has improved. States there is no longer any odor when she changes bandage at home. Currently taking abx for foot wound noted at previous visit. She will also be receiving a PICC during this visit today as poor venous access continues to be an ongoing concern.   Allergies: Ferrous bisglycinate chelate [iron], Mushroom extract complex (obsolete), Na ferric gluc cplx in sucrose, Cymbalta [duloxetine hcl], Hydromorphone , Ondansetron  hcl, Promethazine, Succinylcholine, Buprenorphine hcl, Compazine, Duloxetine, Metoclopramide, Morphine and codeine, Ondansetron , Promethazine hcl, and Tegaderm ag mesh [silver ]  Medications: Prior to Admission medications   Medication Sig Start Date End Date Taking? Authorizing Provider  albuterol  (PROVENTIL ) (2.5 MG/3ML) 0.083% nebulizer solution INHALE 3 ML BY NEBULIZATION EVERY 6 HOURS AS NEEDED FOR WHEEZING OR SHORTNESS OF BREATH 12/13/23   Kassie Acquanetta Bradley, MD  albuterol  (VENTOLIN  HFA) 108 [phone] Base) MCG/ACT inhaler Inhale 2 puffs into the lungs every 6 (six) hours as needed for wheezing or shortness of breath. 01/08/24   Kassie Acquanetta Bradley, MD  ALPRAZolam  (XANAX ) 1 MG tablet 1 tablet daily and 1 tablet PRN no more than 10 days a month. 02/27/24   Mozingo, Regina Nattalie, NP  amoxicillin -clavulanate (AUGMENTIN ) 875-125 MG tablet Take 1 tablet by mouth 2 (two) times daily. 02/26/24   Sherwood Drivers, PA-C  aspirin  EC 81 MG tablet Take 1 tablet (81 mg total) by mouth daily. Swallow whole. Patient taking differently: Take 81 mg by mouth at bedtime. Chewable 07/16/22   Nahser, Aleene PARAS, MD  Bismuth Tribromoph-Petrolatum (XEROFORM OCCLUSIVE GAUZE STRIP) PADS Apply 1 each topically as directed. 09/17/22   Tobie Yetta HERO, MD  bumetanide  (BUMEX ) 1 MG tablet Take 1 tablet (1 mg total) by mouth 2 (two) times daily. 03/11/24   Nahser, Aleene PARAS, MD  buPROPion  (WELLBUTRIN  XL) 150 MG 24 hr tablet TAKE 1 TABLET BY MOUTH EVERY DAY 01/28/24   Mozingo, Regina Nattalie, NP  clonazePAM  (KLONOPIN ) 1 MG tablet Take 1 mg tablet TID PRN and 1 extra for severe anxiety up to 10 a month. 03/11/24   Mozingo, Regina Nattalie, NP  dexlansoprazole  (DEXILANT ) 60 MG capsule Take 1 capsule (60 mg total) by mouth daily. 12/19/23   May, Deanna J, NP  diclofenac  (VOLTAREN ) 50 MG EC tablet Take 50 mg by mouth 2 (two) times daily. *as needed for migraine, Can repeat if needed* 07/23/23   [provider]  diphenhydrAMINE  (BENADRYL ) 12.5 MG/5ML elixir Take 10 mLs (25 mg total) by mouth every 6 (six) hours as needed (nausea). 09/17/22   Tobie Yetta HERO, MD  doxycycline  (VIBRAMYCIN ) 100 MG capsule Take 1 capsule (100 mg total) by mouth 2 (two) times daily. 03/25/24   Zackowski, Scott, MD  EMGALITY  120 MG/ML SOSY Inject 120 mg into the skin every 28 (twenty-eight) days.    [provider]  EPINEPHRINE  0.3 mg/0.3 mL IJ SOAJ injection INJECT 0.3 MG INTO THE MUSCLE AS NEEDED FOR ANAPHYLAXIS. 04/25/23   Kassie Acquanetta Bradley, MD  famotidine  (PEPCID ) 40 MG/5ML suspension Take 2.5 mLs (20 mg total) by mouth daily. 03/30/23 03/02/24  Curatolo, Adam, DO  famotidine  (PEPCID ) 40 MG/5ML suspension TAKE 1.3 MLS (10.4 MG TOTAL) BY MOUTH DAILY - DISCARD BOTTLE AFTER 30 DAYS FROM PICKUP 02/21/24   Tobie Eldora NOVAK, MD  FLORASTOR 250 MG  capsule Take 250 mg by mouth 2 (two) times daily. Mid Morning and Mid Afternoon    [provider]  FLUoxetine  (PROZAC ) 20 MG capsule Take 1 capsule (20 mg total) by mouth daily. 02/27/24   Mozingo, Regina Nattalie, NP  fluticasone -salmeterol (ADVAIR HFA) 230-21 MCG/ACT inhaler Inhale 2 puffs into the lungs 2 (two) times daily. 01/08/24   Kassie Acquanetta Bradley, MD  Heparin  Na, Pork, Lock Flsh PF (BD HEPARIN  POSIFLUSH) 100  UNIT/ML SOLN USE 5 MLS IN PORT A CATH ONCE DAILY AFTER MEDICATION ADMINISTRATION AS A HEPLOCK AS DIRECTED. 01/06/24     hydrocortisone  (CORTEF ) 10 MG tablet Take 1-2 tablets (10-20 mg total) by mouth See admin instructions. Take 20 mg in the am and 10mg  in the evening. Take after completing prednisone  Patient taking differently: Take 10-20 mg by mouth See admin instructions. Take 20 mg in the am at 10 am and 10 mg in the afternoon. Take after completing prednisone  09/29/22   Tobie Yetta HERO, MD  hyoscyamine  (LEVSIN AMIEL) 0.125 MG SL tablet Place 1 tablet (0.125 mg total) under the tongue every 6 (six) hours as needed (esophageal spasm). Up to 1.25 mg daily 12/22/23   Harris, Abigail, PA-C  lamoTRIgine  (LAMICTAL ) 200 MG tablet TAKE 1 TABLET BY MOUTH EVERYDAY AT BEDTIME 02/27/24   Mozingo, Regina Nattalie, NP  lamoTRIgine  (LAMICTAL ) 25 MG tablet Take two tablets every morning. 02/27/24   Mozingo, Regina Nattalie, NP  Lancets (ONETOUCH DELICA PLUS [address]) MISC 3 (three) times daily. for testing 06/12/21   [provider]  lidocaine  (XYLOCAINE ) 2 % solution Use as directed 10 mLs in the mouth or throat every 4 (four) hours as needed (for esophageal pain and pain with swallowing.). 12/22/23   Harris, Abigail, PA-C  LORazepam  (ATIVAN ) 0.5 MG tablet Take 1 tablet (0.5 mg total) by mouth 3 (three) times daily as needed for anxiety. Patient taking differently: Place 0.5 mg into feeding tube 3 (three) times daily as needed for anxiety. [employer] For Nausea 02/11/23   Mozingo, Regina Nattalie, NP  nitroGLYCERIN  (NITROSTAT ) 0.4 MG SL tablet DISSOLVE 1 TAB UNDER THE TONGUE EVERY 5 MINUTES AS NEEDED FOR CHEST PAIN. MAX OF 3 DOSES, THEN 911. 07/03/23   Nahser, Aleene PARAS, MD  NURTEC 75 MG TBDP Take 1 tablet by mouth daily as needed. 09/03/23   [provider]  OLANZapine  (ZYPREXA ) 5 MG tablet Take 1 tablet (5 mg total) by mouth at bedtime. 02/27/24   Mozingo, Regina Nattalie, NP  [facility] VERIO test strip 3  (three) times daily. for testing 06/12/21   [provider]  OXYGEN  Inhale 4-5 L/min into the lungs continuous.    [provider]  potassium chloride  (KLOR-CON ) 10 MEQ tablet as needed. 12/01/23   [provider]  PROAIR  HFA 108 (90 Base) MCG/ACT inhaler Inhale 2 puffs into the lungs every 4 (four) hours as needed for wheezing or shortness of breath.    [provider]  QVAR  REDIHALER 80 MCG/ACT inhaler Inhale 1 puff into the lungs 2 (two) times daily. 05/10/23   [provider]  rosuvastatin  (CRESTOR ) 10 MG tablet Take 10 mg by mouth in the morning. Mid morning 02/28/18   [provider]  sacubitril -valsartan  (ENTRESTO ) 24-26 MG TAKE 1 TABLET BY MOUTH TWICE A DAY Patient taking differently: Take 1 tablet by mouth daily. 07/03/23   Nahser, Aleene PARAS, MD  sodium chloride  flush (BD POSIFLUSH) 0.9 % SOLN injection Use as directed for catheter maintenance 02/07/24     Sodium  Chloride Flush (NORMAL SALINE FLUSH) 0.9 % SOLN Use as directed for central venous catheter maintenance 01/31/23     Sodium Chloride  Flush (NORMAL SALINE FLUSH) 0.9 % SOLN Use as directed for catheter maintenance 11/07/23     Sodium Chloride  Flush (NORMAL SALINE FLUSH) 0.9 % SOLN Use 10 mLs as directed for catheter maintenance. 02/10/24     Sodium Chloride  Flush (NORMAL SALINE FLUSH) 0.9 % SOLN Use as directed for catheter maintenance. 02/10/24     sucralfate  (CARAFATE ) 1 GM/10ML suspension Take 1 g by mouth daily as needed (as directed for ulcers).    [provider]  SYNTHROID  100 MCG tablet Take 1 tablet (100 mcg total) by mouth every morning. Patient taking differently: Take 100 mcg by mouth every morning. * Must be name brand* 10/19/21   Gudena, Vinay, MD  Tiotropium Bromide  Monohydrate (SPIRIVA  RESPIMAT) 1.25 MCG/ACT AERS Inhale 2 puffs into the lungs daily. 01/08/24   Kassie Acquanetta Bradley, MD  topiramate  (TOPAMAX ) 50 MG tablet Take 150 mg by mouth at bedtime. 12/25/19   [provider]  zolpidem  (AMBIEN  CR) 12.5 MG CR tablet TAKE ONE TABLET BY MOUTH AT BEDTIME AS NEEDED FOR SLEEP. 05/14/23        Vital Signs: LMP  (LMP Unknown)   Physical Exam HENT:     Mouth/Throat:     Mouth: Mucous membranes are moist.     Pharynx: Oropharynx is clear.   Cardiovascular:     Rate and Rhythm: Normal rate and regular rhythm.  Pulmonary:     Effort: Pulmonary effort is normal.     Breath sounds: Normal breath sounds.   Musculoskeletal:        General: Signs of injury present.     Right lower leg: Edema present.     Left lower leg: Edema present.     Comments: Dressing R foot   Skin:    Comments: Wound L chest without exudate. Small sinus with granulation tissue noted . No erythema of surrounding tissue. No fluctuance around remaining sinus.    Neurological:     Mental Status: She is alert.   Psychiatric:        Mood and Affect: Mood normal.        Behavior: Behavior normal.        Thought Content: Thought content normal.        Judgment: Judgment normal.     Imaging: No results found.  Labs:  CBC: Recent Labs    07/26/23 1433 09/03/23 1315 12/07/23 1259 03/25/24 2110  WBC 11.4* 13.6* 9.6 12.3*  HGB 10.0* 9.6* 9.6* 9.2*  HCT 32.7* 31.1* 31.2* 30.9*  PLT 314 332 370 393    COAGS: No results for input(s): INR, APTT in the last 8760 hours.  BMP: Recent Labs    07/26/23 1433 09/03/23 1315 12/07/23 1259 03/25/24 2029  NA 140 141 141 137  K 3.2* 3.2* 3.6 3.5  CL 103 105 104 99  CO2 24 26 28 27   GLUCOSE 169* 170* 79 112*  BUN 30* 28* 28* 29*  CALCIUM  9.1 8.4* 9.1 9.8  CREATININE 1.25* 1.45* 1.31* 1.31*  GFRNONAA 52* 43* 49* 49*    LIVER FUNCTION TESTS: Recent Labs    04/22/23 1735 07/26/23 1433 09/03/23 1315 03/25/24 2029  BILITOT 0.5 0.6 0.3 0.4  AST 22 20 18 28   ALT 32 30 25 33  ALKPHOS 63 56 56 58  PROT 7.9 7.5 6.8 7.5  ALBUMIN 3.9 3.9 3.8 3.7  Assessment and Plan:  Pt presents to IR department for  scheduled L chest wound (from port infection) hydrogel packing. This wound continues to improve, no new swelling or erythema around wound.   Irrigated with NS, dried, hydrogel packing introduced into wound. Well tolerated. Redressed.   Patient to return in one week since sinus benefiting from hydrogel still present. Patient will likely be extended to home management at that time as wound continues to heal.    Media Information     Electronically Signed: Laymon Coast, NP 03/31/2024, 11:26 AM   I spent a total of 25 Minutes at the the patient's bedside AND on the patient's hospital floor or unit, greater than 50% of which was counseling/coordinating care for wound care for L chest port removal d/t infection

## 2024-04-01 ENCOUNTER — Other Ambulatory Visit: Payer: Self-pay

## 2024-04-01 ENCOUNTER — Other Ambulatory Visit (HOSPITAL_COMMUNITY): Payer: Self-pay

## 2024-04-02 ENCOUNTER — Other Ambulatory Visit (HOSPITAL_COMMUNITY): Payer: Self-pay

## 2024-04-02 DIAGNOSIS — R911 Solitary pulmonary nodule: Secondary | ICD-10-CM | POA: Diagnosis not present

## 2024-04-02 DIAGNOSIS — G8222 Paraplegia, incomplete: Secondary | ICD-10-CM | POA: Diagnosis not present

## 2024-04-02 MED ORDER — HEPARIN NA (PORK) LOCK FLSH PF 100 UNIT/ML IV SOLN
INTRAVENOUS | 3 refills | Status: DC
  Filled 2024-04-04: qty 150, 30d supply, fill #0
  Filled 2024-05-05 – 2024-06-08 (×2): qty 150, 30d supply, fill #1

## 2024-04-04 ENCOUNTER — Other Ambulatory Visit (HOSPITAL_COMMUNITY): Payer: Self-pay

## 2024-04-06 ENCOUNTER — Ambulatory Visit (HOSPITAL_COMMUNITY)
Admission: RE | Admit: 2024-04-06 | Discharge: 2024-04-06 | Disposition: A | Discharge status: Home / Self Care | Source: Ambulatory Visit

## 2024-04-06 ENCOUNTER — Telehealth: Payer: Self-pay | Admitting: Gastroenterology

## 2024-04-06 ENCOUNTER — Other Ambulatory Visit (HOSPITAL_COMMUNITY): Payer: Self-pay

## 2024-04-06 DIAGNOSIS — T8149XA Infection following a procedure, other surgical site, initial encounter: Secondary | ICD-10-CM

## 2024-04-06 NOTE — Telephone Encounter (Signed)
 Inbound call from patient to discuss symptoms. PT had a surgery to repair a GI bleed at the end of May and since then she has had diarrhea. Last week it turned into a clear leakage. She has the feeling to pass gas and it just leaks the liquid. She has not had a solid BM since Monday of last week and she is very concerned. She has an appointment scheduled with Dr. San in August. I offered an earlier one with his APP and she wanted to confirm that the doctor would be okay with that consider her situation is more advanced she felt it needs to be monitored by him only. Please advise.

## 2024-04-06 NOTE — Progress Notes (Signed)
  Patient here for wound check after Port removal.  It is almost completely healed.  She can d/c packing as the wound has no measurable depth.  Switch to Aquaphor or Vaseline and Tegaderm.  No need for further follow ups as this should be completed healed in just a couple more days.  Fanta Wimberley S Arcenio Mullaly PA-C 04/06/2024 2:32 PM

## 2024-04-06 NOTE — Telephone Encounter (Signed)
 Pt stated that she is having a hard time swallowing and the last three days she has had three episodes of emesis with a small amount of blood. Pt taking Dexilant  and Carafate . Prescribed Fluconazole . Pt states that she recently had her porta cath took out and now on high dose steroids and antibiotics. Pt does have a PICC line.  Pt was scheduled to see Deanna May NP on 04/08/2024 at 9:00 AM.  Pt made aware.  Pt notified that if the emesis with blood increases or she has any shortness of breath, chest pain, lightheadedness of dizziness to go to the ED for evaluation and treatment.  Pt verbalized understanding with all questions answered.

## 2024-04-07 NOTE — H&P (View-Only) (Signed)
 [name]  Chief Complaint: follow-up, dysphagia, LPR, globus sensation Primary GI Doctor: Dr. San   HPI:  Patient is a 53 year old female patient with past medical history of chronic lymphedema, hx cushings secondary to pituitary adenoma s/p resection and gamma knife complicated by adrenal insufficiency, history of Hodgkin's at age 13 s/p chemoradiation, breast cancer s/p chemotherapy and bilateral mastectomy in 2000 which is in remission, cervical cancer s/p LEEP 2002, ovarian tumor precancerous s/p TH and left oophorectomy in 2011 and right in 2016, hx postablative hypothyroidism, ADHD, anxiety/depression and chronic hypoxemic respiratory failure secondary to unknown etiology (%L 22 at home), history of DVTs (will go on short term heparin  or lovenox ), who was referred to me by Toppin, Cy HERO, MD on 10/28/23 for a complaint of dysphagia, LPR, globus sensation .  Last seen by myself on   On 12/07/2023 patient went to [facility] ED for complaints of pill stuck in her throat.No obvious foreign body noted on CT. She is on a PPI and H2. We tried to prescribe dexilant  since it worked for her prior, but insurance did not authorize.   On 10/28/2023 patient seen by ENT Dr. Tobie for evaluation of foreign body sensation in throat. She reports continued reflux trouble and globus sensation. TFL and MBS unremarkable. Possible esophageal dysmotility mentioned. Refer to GI for ruling out dysmotility; [name] benefit from EGD but will defer to their expertise. --She has previously seen Dr. Brien for aspiration and voice loss, and she had a normal oropharyngeal swallowing on FEES. Prior saw Dr. Brien in 2016 for VF nodule and globus and swallowing.  --She was followed by Dr. Tobie and he has done multiple upper endoscopies.  12/26/23 EGD at [facility] with Dr. [name] landmarks identified. - 2 cm hiatal hernia. - Severe esophageal candiasis is causing her symptoms. Biopsied. - Suspected benign-  appearing esophageal stenosis in the proximal esophagus - not dilated today given severity of esophageal candiasis. - Multiple gastric polyps. Likely fundic gland polyps. Biopsied. - A single recently bleeding angiodysplastic lesion in the stomach. Treated with argon plasma coagulation ( APC) . - Normal stomach otherwise. - Normal examined duodenum. Plan: Start fluconazole  400mg  x 1 dose, then 200mg  / day for another 20 days  02/11/24 repeat EGD with Dr. San - Benign- appearing esophageal stenosis. - Esophageal plaques were found, suspicious for candidiasis. Biopsied. - Z- line regular, 38 cm from the incisors. - 2 cm hiatal hernia. - Multiple gastric polyps. - Normal examined duodenum. Biopsies again showed candida esophagitis. Repeat course of fluconazole  for hopeful eradication.    Interval History       Patient presents for follow-up on esophageal dysphagia secondary to candida esophagitis,esophageal stricture. Patient very tearful today and reports she just wants to feel better.  Patient treated for candida 2 rounds and dilatation on second endoscopy. She is on long term steroids for her Addison's disease and has required antibiotics for various infections. She comes in today without much improvement in symptoms.  Patient has had nausea and vomiting intermittently for the past 2 weeks. She is forcefully coughing with trying to swallow her pills. She has been able to keep liquids down, but any food she tries to swallow comes back up. She has been drinking protein shakes for nutrition.  Patient reports last 3 days she has had dark blood in vomit.  Patient notes she has seen dried dark blood in mouth and back of throat   She is taking Dexalansoprazole 60 mg po daily  and famotidine  liquid at bedtime for the GERD. If she had breakthrough symptoms she uses the Carafate  liquid.  She has lidocaine  swish and GI cocktails prn for throat pain.  She has used Benadryl  and Ativan  via PICC for nausea. She  is out of the ativan  and requests refill today. The benadryl  alone is not controlled the nausea and vomiting.  She reports she has allergies to ondansetron , promethazine, compazine, and scopolamine .    Patient is currently on 5L N/C continuous.  Patient on baby aspirin  81mg  po daily.    Wt Readings from Last 3 Encounters:  04/08/24 171 lb (77.6 kg)  03/25/24 171 lb (77.6 kg)  03/02/24 164 lb 3.2 oz (74.5 kg)    Past Medical History:  Diagnosis Date   Addison's disease (HCC)    Adrenal insufficiency (HCC)    Anemia    Anxiety    Aortic stenosis    Aortic stenosis    Appendicitis 12/19/2009   Appendicitis    Breast cancer (HCC)    STATUS POST BILATERAL MASTECTOMY. STATUS POST RECONSTRUCTION. SHE HAD SILICONE BREAST IMPLANTS AND THE LEFT IMPLANT IS LEAKING SLIGHTLY   Cellulitis of right middle finger 11/07/2018   Cervical cancer (HCC) 12/23/2018   Chest pain    CHF with right heart failure (HCC) 04/17/2017   Chronic respiratory failure with hypoxia (HCC) 12/23/2018   CKD (chronic kidney disease) stage 3, GFR 30-59 ml/min (HCC)    Cough variant asthma 04/13/2019   Depression    Functional neurological symptom disorder with mixed symptoms 2023   GERD (gastroesophageal reflux disease)    takes Dexilant  and carafate  and gi coctail    Headache    migraines on a daily and monthly regimen    Heart murmur    History of kidney stones    Hodgkin lymphoma (HCC)    STATUS POST MANTLE RADIATION   Hodgkin's lymphoma (HCC)    1987   Hypertension    Hypoxia    Multiple lung nodules    bilateral   Necrotizing fasciitis (HCC) 12/23/2018   Non-ischemic cardiomyopathy (HCC)    Osteoporosis    Ovarian tumor (benign)    bilateral   Palpitations    Pituitary adenoma (HCC) 12/23/2018   Pneumonia    PONV (postoperative nausea and vomiting)    Pre-diabetes    per pt; no meds   Pulmonary hypertension (HCC) 12/23/2018   Raynaud phenomenon    Right heart failure (HCC) 04/17/2017    Seizures (HCC)    last febrile seizure was approx 3 weeks ago per report on 12/01/2020   Supplemental oxygen  dependent    3 liters   SVT (supraventricular tachycardia) (HCC)    Tachycardia    Thyroid  cancer (HCC)    STATUS POST SURGICAL REMOVAL-CURRENT ON THYROID  REPLACEMENT    Past Surgical History:  Procedure Laterality Date   ABDOMINAL HYSTERECTOMY     AMPUTATION Left 01/30/2019   Procedure: Left Index finger amputation with flap reconstruction and repair reconstruction;  Surgeon: Camella Fallow, MD;  Location: [facility];  Service: Orthopedics;  Laterality: Left;   APPENDECTOMY     BIOPSY OF SKIN SUBCUTANEOUS TISSUE AND/OR MUCOUS MEMBRANE  12/26/2023   Procedure: BIOPSY, SKIN, SUBCUTANEOUS TISSUE, OR MUCOUS MEMBRANE;  Surgeon: Leigh Elspeth SQUIBB, MD;  Location: [facility];  Service: Gastroenterology;;   breast implants and removal      breast implants but leaking      CARDIAC CATHETERIZATION  05/18/2009   NORMAL CATH   CESAREAN SECTION  2007  and 1997   COLONOSCOPY     ESOPHAGEAL DILATION  02/11/2024   Procedure: DILATION, ESOPHAGUS;  Surgeon: San Sandor GAILS, DO;  Location: [facility];  Service: Gastroenterology;;   ESOPHAGOGASTRODUODENOSCOPY N/A 12/26/2023   Procedure: EGD (ESOPHAGOGASTRODUODENOSCOPY);  Surgeon: Leigh Elspeth SQUIBB, MD;  Location: [facility];  Service: Gastroenterology;  Laterality: N/A;   ESOPHAGOGASTRODUODENOSCOPY N/A 02/11/2024   Procedure: EGD (ESOPHAGOGASTRODUODENOSCOPY);  Surgeon: San Sandor GAILS, DO;  Location: [facility];  Service: Gastroenterology;  Laterality: N/A;   HOT HEMOSTASIS N/A 12/26/2023   Procedure: EGD, WITH ARGON PLASMA COAGULATION;  Surgeon: Leigh Elspeth SQUIBB, MD;  Location: [facility];  Service: Gastroenterology;  Laterality: N/A;   hx of chemotherapy      hx of radiation therapy      I & D EXTREMITY Left 12/23/2018   Procedure: IRRIGATION AND DEBRIDEMENT HAND / INDEX FINGER;  Surgeon: Camella Fallow, MD;  Location: [facility];  Service: Orthopedics;  Laterality: Left;   IR CV LINE INJECTION  03/22/2022   IR FLUORO GUIDE CV LINE RIGHT  10/09/2022   IR FLUORO GUIDE CV LINE RIGHT  01/18/2023   IR FLUORO GUIDE CV LINE RIGHT  03/26/2023   IR FLUORO GUIDE CV LINE RIGHT  10/10/2023   IR FLUORO GUIDE CV LINE RIGHT  01/06/2024   IR IMAGING GUIDED PORT INSERTION  05/06/2020   IR IMAGING GUIDED PORT INSERTION  12/04/2021   IR IMAGING GUIDED PORT INSERTION  02/25/2024   IR PATIENT EVAL TECH 0-60 MINS  03/12/2024   IR RADIOLOGIST EVAL & MGMT  03/12/2024   IR RADIOLOGIST EVAL & MGMT  03/31/2024   IR REMOVAL TUN ACCESS W/ PORT W/O FL MOD SED  04/27/2020   IR REMOVAL TUN ACCESS W/ PORT W/O FL MOD SED  12/04/2021   IR REMOVAL TUN ACCESS W/ PORT W/O FL MOD SED  03/30/2022   IR REMOVAL TUN ACCESS W/ PORT W/O FL MOD SED  02/27/2024   IR REMOVAL TUN CV CATH W/O FL  02/25/2024   IR US  GUIDE VASC ACCESS RIGHT  10/09/2022   KIDNEY STONE SURGERY     LUMBAR PUNCTURE W/ INTRATHECAL CHEMOTHERAPY     MASTECTOMY     PITUITARY SURGERY     RIGHT/LEFT HEART CATH AND CORONARY ANGIOGRAPHY N/A 04/02/2018   Procedure: RIGHT/LEFT HEART CATH AND CORONARY ANGIOGRAPHY;  Surgeon: Verlin Lonni BIRCH, MD;  [facility]: [facility];  Service: Cardiovascular;  Laterality: N/A;   RIGHT/LEFT HEART CATH AND CORONARY ANGIOGRAPHY N/A 08/31/2022   Procedure: RIGHT/LEFT HEART CATH AND CORONARY ANGIOGRAPHY;  Surgeon: Cherrie Toribio SAUNDERS, MD;  Location: [facility];  Service: Cardiovascular;  Laterality: N/A;   TOOTH EXTRACTION N/A 12/05/2020   Procedure: DENTAL RESTORATION/EXTRACTIONS;  Surgeon: Arvil Lonni RAMAN, MD;  Location: [facility];  Service: Oral Surgery;  Laterality: N/A;   TOTAL THYROIDECTOMY     VIDEO BRONCHOSCOPY Bilateral 11/14/2018   Procedure: VIDEO BRONCHOSCOPY WITHOUT FLUORO;  Surgeon: Kassie Acquanetta Bradley, MD;  Location: [facility];  Service: Cardiopulmonary;  Laterality: Bilateral;   VIDEO BRONCHOSCOPY WITH ENDOBRONCHIAL  ULTRASOUND N/A 11/19/2018   Procedure: VIDEO BRONCHOSCOPY WITH ENDOBRONCHIAL ULTRASOUND;  Surgeon: Kassie Acquanetta Bradley, MD;  Location: [facility];  Service: Thoracic;  Laterality: N/A;    Current Outpatient Medications  Medication Sig Dispense Refill   albuterol  (PROVENTIL ) (2.5 MG/3ML) 0.083% nebulizer solution INHALE 3 ML BY NEBULIZATION EVERY 6 HOURS AS NEEDED FOR WHEEZING OR SHORTNESS OF BREATH 225 mL 1   albuterol  (VENTOLIN  HFA) 108 (90 Base) MCG/ACT  inhaler Inhale 2 puffs into the lungs every 6 (six) hours as needed for wheezing or shortness of breath. 8 g 6   amoxicillin -clavulanate (AUGMENTIN ) 875-125 MG tablet Take 1 tablet by mouth 2 (two) times daily. 14 tablet 0   aspirin  EC 81 MG tablet Take 1 tablet (81 mg total) by mouth daily. Swallow whole. (Patient taking differently: Take 81 mg by mouth at bedtime. Chewable) 90 tablet 3   Bismuth Tribromoph-Petrolatum (XEROFORM OCCLUSIVE GAUZE STRIP) PADS Apply 1 each topically as directed. 50 each 1   bumetanide  (BUMEX ) 1 MG tablet Take 1 tablet (1 mg total) by mouth 2 (two) times daily. 180 tablet 3   buPROPion  (WELLBUTRIN  XL) 150 MG 24 hr tablet TAKE 1 TABLET BY MOUTH EVERY DAY 90 tablet 0   clonazePAM  (KLONOPIN ) 1 MG tablet Take 1 mg tablet TID PRN and 1 extra for severe anxiety up to 10 a month. 100 tablet 0   dexlansoprazole  (DEXILANT ) 60 MG capsule Take 1 capsule (60 mg total) by mouth daily. 90 capsule 3   diclofenac  (VOLTAREN ) 50 MG EC tablet Take 50 mg by mouth 2 (two) times daily. *as needed for migraine, Can repeat if needed*     diphenhydrAMINE  (BENADRYL ) 12.5 MG/5ML elixir Take 10 mLs (25 mg total) by mouth every 6 (six) hours as needed (nausea). 120 mL 0   doxycycline  (VIBRAMYCIN ) 100 MG capsule Take 1 capsule (100 mg total) by mouth 2 (two) times daily. 14 capsule 0   EMGALITY  120 MG/ML SOSY Inject 120 mg into the skin every 28 (twenty-eight) days.     EPINEPHRINE  0.3 mg/0.3 mL IJ SOAJ injection INJECT 0.3 MG INTO THE MUSCLE AS NEEDED  FOR ANAPHYLAXIS. 2 each 1   famotidine  (PEPCID ) 40 MG/5ML suspension Take 2.5 mLs (20 mg total) by mouth daily. 75 mL 0   FLORASTOR 250 MG capsule Take 250 mg by mouth 2 (two) times daily. Mid Morning and Mid Afternoon     FLUoxetine  (PROZAC ) 20 MG capsule Take 1 capsule (20 mg total) by mouth daily. 90 capsule 0   fluticasone -salmeterol (ADVAIR HFA) 230-21 MCG/ACT inhaler Inhale 2 puffs into the lungs 2 (two) times daily. 1 each 11   Heparin  Na, Pork, Lock Flsh PF (BD HEPARIN  POSIFLUSH) 100 UNIT/ML SOLN USE 5 MLS IN PORT A CATH ONCE DAILY AFTER MEDICATION ADMINISTRATION AS A HEPLOCK AS DIRECTED. 150 mL 3   hydrocortisone  (CORTEF ) 10 MG tablet Take 1-2 tablets (10-20 mg total) by mouth See admin instructions. Take 20 mg in the am and 10mg  in the evening. Take after completing prednisone  (Patient taking differently: Take 10-20 mg by mouth See admin instructions. Take 20 mg in the am at 10 am and 10 mg in the afternoon. Take after completing prednisone )     hyoscyamine  (LEVSIN /SL) 0.125 MG SL tablet Place 1 tablet (0.125 mg total) under the tongue every 6 (six) hours as needed (esophageal spasm). Up to 1.25 mg daily 30 tablet 1   lamoTRIgine  (LAMICTAL ) 200 MG tablet TAKE 1 TABLET BY MOUTH EVERYDAY AT BEDTIME 30 tablet 2   lamoTRIgine  (LAMICTAL ) 25 MG tablet Take two tablets every morning. 60 tablet 2   Lancets (ONETOUCH DELICA PLUS LANCET33G) MISC 3 (three) times daily. for testing     lidocaine  (XYLOCAINE ) 2 % solution Use as directed 10 mLs in the mouth or throat every 4 (four) hours as needed (for esophageal pain and pain with swallowing.). 100 mL 1   LORazepam  (ATIVAN ) 0.5 MG tablet Take 1 tablet (0.5  mg total) by mouth 3 (three) times daily as needed for anxiety. (Patient taking differently: Place 0.5 mg into feeding tube 3 (three) times daily as needed for anxiety. Access Port For Nausea) 12 tablet 0   nitroGLYCERIN  (NITROSTAT ) 0.4 MG SL tablet DISSOLVE 1 TAB UNDER THE TONGUE EVERY 5 MINUTES AS  NEEDED FOR CHEST PAIN. MAX OF 3 DOSES, THEN 911. 75 tablet 0   NURTEC 75 MG TBDP Take 1 tablet by mouth daily as needed.     OLANZapine  (ZYPREXA ) 5 MG tablet Take 1 tablet (5 mg total) by mouth at bedtime. 30 tablet 2   ONETOUCH VERIO test strip 3 (three) times daily. for testing     OXYGEN  Inhale 4-5 L/min into the lungs continuous.     potassium chloride  (KLOR-CON ) 10 MEQ tablet as needed.     PROAIR  HFA 108 (90 Base) MCG/ACT inhaler Inhale 2 puffs into the lungs every 4 (four) hours as needed for wheezing or shortness of breath.     QVAR  REDIHALER 80 MCG/ACT inhaler Inhale 1 puff into the lungs 2 (two) times daily.     rosuvastatin  (CRESTOR ) 10 MG tablet Take 10 mg by mouth in the morning. Mid morning     sacubitril -valsartan  (ENTRESTO ) 24-26 MG TAKE 1 TABLET BY MOUTH TWICE A DAY (Patient taking differently: Take 1 tablet by mouth daily.) 60 tablet 5   sodium chloride  flush (BD POSIFLUSH) 0.9 % SOLN injection Use as directed for catheter maintenance 600 mL 3   Sodium Chloride  Flush (NORMAL SALINE FLUSH) 0.9 % SOLN Use as directed for central venous catheter maintenance 600 mL 3   Sodium Chloride  Flush (NORMAL SALINE FLUSH) 0.9 % SOLN Use as directed for catheter maintenance 600 mL 3   Sodium Chloride  Flush (NORMAL SALINE FLUSH) 0.9 % SOLN Use 10 mLs as directed for catheter maintenance. 600 mL 3   Sodium Chloride  Flush (NORMAL SALINE FLUSH) 0.9 % SOLN Use as directed for catheter maintenance. 600 mL 3   sucralfate  (CARAFATE ) 1 GM/10ML suspension Take 1 g by mouth daily as needed (as directed for ulcers).     SYNTHROID  100 MCG tablet Take 1 tablet (100 mcg total) by mouth every morning. (Patient taking differently: Take 100 mcg by mouth every morning. * Must be name brand*)     Tiotropium Bromide  Monohydrate (SPIRIVA  RESPIMAT) 1.25 MCG/ACT AERS Inhale 2 puffs into the lungs daily. 4 g 11   topiramate  (TOPAMAX ) 50 MG tablet Take 150 mg by mouth at bedtime.     zolpidem  (AMBIEN  CR) 12.5 MG CR  tablet TAKE ONE TABLET BY MOUTH AT BEDTIME AS NEEDED FOR SLEEP. 90 tablet 0   ALPRAZolam  (XANAX ) 1 MG tablet 1 tablet daily and 1 tablet PRN no more than 10 days a month. (Patient not taking: Reported on 04/08/2024) 40 tablet 2   No current facility-administered medications for this visit.    Allergies as of 04/08/2024 - Review Complete 04/08/2024  Allergen Reaction Noted   Ferrous bisglycinate chelate [iron] Anaphylaxis and Other (See Comments) 07/15/2020   Mushroom extract complex (obsolete) Anaphylaxis 04/23/2019   Na ferric gluc cplx in sucrose Anaphylaxis 11/16/2014   Cymbalta [duloxetine hcl] Swelling and Anxiety 02/06/2011   Hydromorphone  Other (See Comments) 05/08/2016   Ondansetron  hcl Other (See Comments) 05/22/2022   Promethazine Other (See Comments) 08/03/2021   Succinylcholine Other (See Comments) 12/23/2018   Buprenorphine hcl Hives 02/03/2015   Compazine Other (See Comments) 02/06/2011   Duloxetine  02/06/2011   Metoclopramide Other (See Comments) 05/08/2016  Morphine and codeine Hives 02/06/2011   Ondansetron  Hives and Rash 02/03/2015   Promethazine hcl Hives 02/06/2011   Tegaderm ag mesh [silver ] Rash 02/06/2011    Family History  Adopted: Yes  Family history unknown: Yes    Review of Systems:    Constitutional: No weight loss, fever, chills, weakness or fatigue HEENT: Eyes: No change in vision               Ears, Nose, Throat:  No change in hearing or congestion Skin: No rash or itching Cardiovascular: No chest pain, chest pressure or palpitations   Respiratory: No SOB or cough Gastrointestinal: See HPI and otherwise negative Genitourinary: No dysuria or change in urinary frequency Neurological: No headache, dizziness or syncope Musculoskeletal: No new muscle or joint pain Hematologic: No bleeding or bruising Psychiatric: No history of depression or anxiety    Physical Exam:  Vital signs: BP 132/76   Pulse 100   Ht 5' 5 (1.651 m)   Wt 171 lb (77.6  kg)   LMP  (LMP Unknown)   SpO2 99%   BMI 28.46 kg/m   Constitutional: Tearful ill appearing female, alert and cooperative Throat: Oral cavity and pharynx without inflammation, swelling or lesion.  Respiratory: Respirations even and unlabored. Lung sounds diminished  to auscultation bilaterally.   No wheezes, crackles, or rhonchi. On N/C on 5L Cardiovascular: Murmur noted. No cyanosis or pallor.  Gastrointestinal:  Soft, nondistended, nontender. No rebound or guarding. Normal bowel sounds. No appreciable masses or hepatomegaly. Rectal:  Not performed.  Msk:  Symmetrical without gross deformities. Lymphedema noted to upper extremities and lower extremities.  Neurologic:  Alert and  oriented x4;  grossly normal neurologically.  Skin:   Dry and intact without significant lesions or rashes. Psychiatric: Oriented to person, place and time. Demonstrates good judgement and reason without abnormal affect or behaviors.  RELEVANT LABS AND IMAGING: CBC    Latest Ref Rng & Units 03/25/2024    9:10 PM 12/07/2023   12:59 PM 09/03/2023    1:15 PM  CBC  WBC 4.0 - 10.5 K/uL 12.3  9.6  13.6   Hemoglobin 12.0 - 15.0 g/dL 9.2  9.6  9.6   Hematocrit 36.0 - 46.0 % 30.9  31.2  31.1   Platelets 150 - 400 K/uL 393  370  332      CMP     Latest Ref Rng & Units 03/25/2024    8:29 PM 12/07/2023   12:59 PM 09/03/2023    1:15 PM  CMP  Glucose 70 - 99 mg/dL 887  79  829   BUN 6 - 20 mg/dL 29  28  28    Creatinine 0.44 - 1.00 mg/dL 8.68  8.68  8.54   Sodium 135 - 145 mmol/L 137  141  141   Potassium 3.5 - 5.1 mmol/L 3.5  3.6  3.2   Chloride 98 - 111 mmol/L 99  104  105   CO2 22 - 32 mmol/L 27  28  26    Calcium  8.9 - 10.3 mg/dL 9.8  9.1  8.4   Total Protein 6.5 - 8.1 g/dL 7.5   6.8   Total Bilirubin 0.0 - 1.2 mg/dL 0.4   0.3   Alkaline Phos 38 - 126 U/L 58   56   AST 15 - 41 U/L 28   18   ALT 0 - 44 U/L 33   25      Lab Results  Component Value Date   TSH  0.508 04/23/2023   04/25/23 Echo-Left  ventricular ejection fraction, by estimation, is 60 to 65%.   Procedures: 12/26/23 EGD at [facility] with Dr. [name] landmarks identified. - 2 cm hiatal hernia. - Severe esophageal candiasis is causing her symptoms. Biopsied. - Suspected benign- appearing esophageal stenosis in the proximal esophagus - not dilated today given severity of esophageal candiasis. - Multiple gastric polyps. Likely fundic gland polyps. Biopsied. - A single recently bleeding angiodysplastic lesion in the stomach. Treated with argon plasma coagulation ( APC) . - Normal stomach otherwise. - Normal examined duodenum. Plan: Start fluconazole  400mg  x 1 dose, then 200mg  / day for another 20 days  02/11/24 repeat EGD with Dr. San - Benign- appearing esophageal stenosis. - Esophageal plaques were found, suspicious for candidiasis. Biopsied. - Z- line regular, 38 cm from the incisors. - 2 cm hiatal hernia. - Multiple gastric polyps. - Normal examined duodenum. Biopsies again showed candida esophagitis. Repeat course of fluconazole  for hopeful eradication.   08/01/2020 colonoscopy, recall 10 years Impression Nonthrombosed external hemorrhoids found on perianal exam Internal hemorrhoids The examination was otherwise normal No specimens collected. 08/01/2020 EGD Findings Normal esophagus 3 cm hiatal hernia Multiple fundic gland polyps Normal examined duodenum No source of hematemesis is identified Biopsies of the greater curvature of the gastric body, lesser curvature of the gastric body, at the incisura, on the greater curvature of the gastric atrium and on the lesser curvature of the gastric atrium. Path stomach biopsy Atrial and oxyntic mucosa with no significant pathologic findings.  Negative for H. Pylori. 09/16/23 MBSS-  Clinical Impression:  Patient presents with a normal oropharyngeal swallow however she does present with a suspected primary esophageal dysphagia as per this modified barium swallow  study. 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 Thicker barium consistencies (honey thick, pudding thick and mechanical soft solids did transit a little slowly through PES and upper esophagus but no retrograde movement or stasis of barium visualized in upper esophagus or at level of PES. Patient attempted to swallow barium tablet with plain water  but she gagged while tablet still in mouth and spit it out. She reports this is something that can occur without warning when she is eating or taking medications. No aspiration or penetration observed and oral and pharyngeal structures all appeared to be [facility]. SLP recommended patient see an outpatient SLP for help with reintroducing solid foods into her diet.  12/07/23 CT Soft tissue Neck WO contrast IMPRESSION: 1. No acute or focal lesion to explain the patient's symptoms. No radiopaque foreign body or soft tissue nodule to account for a medicine tablet or capsule in the neck. 2. Ill-defined airspace opacities in the right upper lobe raise concern for pneumonia.   11/15/2010 EGD      Assessment: Encounter Diagnoses  Name Primary?   Esophageal dysphagia Yes   Esophageal candidiasis (HCC)    Gastroesophageal reflux disease with esophagitis and hemorrhage    Benign esophageal stricture    Chronic nausea    Hemoptysis    Poor appetite     53 year old female patient with extensive medical history who presents with complaint of esophageal dysphagia, throat pain, and odynophagia.  March EGD revealed severe esophageal Candida's with benign-appearing esophageal stenosis.  A single bleeding angiodysplastic lesion was treated with APC.  Patient was treated with fluconazole  p.o. extended treatment.  Follow-up EGD in [place] shows some improvement and esophageal Candida however still present.  Patient was treated with second round of fluconazole .  Patient presents today with persistent symptoms.  She has been taking  the Dexlansoprazole  60 mg p.o. daily with Pepcid  liquid as needed for GERD.  Patient is also using GI cocktail and lidocaine  swish as needed for throat pain and odynophagia.  Patient is only able to consume soft pured diet and has difficulty with pills.  Over the last 3 days patient has had some nausea and vomiting and noted blood.  Suspect possible irritation or Mallory weiss tear?  Patient unfortunately unable to tolerate most antiemetics without side effects.  Patient responds to Benadryl  and Ativan  will send short prescription of Ativan  to control nausea and vomiting.  Will schedule follow-up upper GI endoscopy in [place] to evaluate if lingering or recurrent Candida.  Patient Rigo Letts need referral to the ID clinic pending results.   Plan: -  Recheck CBC, BMET today -Cont Dexalansoprazole 60 mg po daily - Continue famotidine  liquid once to twice daily prn breakthrough symptoms -Continue GI cocktail prn  -Continue Soft pureed diet  - Short script ativan  0.5 mg prn sent for nausea, ok with Dr. San -  Will schedule  repeat upper endoscopy at [facility] unit for reevaluation of the proximal esophageal stricture along with confirmation of eradication of Candida esophagitis with Dr. Albesa - If lingering or recurrent Candida, will plan for referral back to the ID clinic.     Thank you for the courtesy of this consult. Please call me with any questions or concerns.   Tonga Prout, FNP-C [facility] 04/08/2024, 12:27 PM  Cc: Toppin, Jean M, MD

## 2024-04-07 NOTE — Progress Notes (Unsigned)
 [name]  Chief Complaint: follow-up, dysphagia, LPR, globus sensation Primary GI Doctor: Dr. San   HPI:  Patient is a 53 year old female patient with past medical history of chronic lymphedema, hx cushings secondary to pituitary adenoma s/p resection and gamma knife complicated by adrenal insufficiency, history of Hodgkin's at age 13 s/p chemoradiation, breast cancer s/p chemotherapy and bilateral mastectomy in 2000 which is in remission, cervical cancer s/p LEEP 2002, ovarian tumor precancerous s/p TH and left oophorectomy in 2011 and right in 2016, hx postablative hypothyroidism, ADHD, anxiety/depression and chronic hypoxemic respiratory failure secondary to unknown etiology (%L 22 at home), history of DVTs (will go on short term heparin  or lovenox ), who was referred to me by Toppin, Cy HERO, MD on 10/28/23 for a complaint of dysphagia, LPR, globus sensation .  Last seen by myself on   On 12/07/2023 patient went to [facility] ED for complaints of pill stuck in her throat.No obvious foreign body noted on CT. She is on a PPI and H2. We tried to prescribe dexilant  since it worked for her prior, but insurance did not authorize.   On 10/28/2023 patient seen by ENT Dr. Tobie for evaluation of foreign body sensation in throat. She reports continued reflux trouble and globus sensation. TFL and MBS unremarkable. Possible esophageal dysmotility mentioned. Refer to GI for ruling out dysmotility; [name] benefit from EGD but will defer to their expertise. --She has previously seen Dr. Brien for aspiration and voice loss, and she had a normal oropharyngeal swallowing on FEES. Prior saw Dr. Brien in 2016 for VF nodule and globus and swallowing.  --She was followed by Dr. Tobie and he has done multiple upper endoscopies.  12/26/23 EGD at [facility] with Dr. [name] landmarks identified. - 2 cm hiatal hernia. - Severe esophageal candiasis is causing her symptoms. Biopsied. - Suspected benign-  appearing esophageal stenosis in the proximal esophagus - not dilated today given severity of esophageal candiasis. - Multiple gastric polyps. Likely fundic gland polyps. Biopsied. - A single recently bleeding angiodysplastic lesion in the stomach. Treated with argon plasma coagulation ( APC) . - Normal stomach otherwise. - Normal examined duodenum. Plan: Start fluconazole  400mg  x 1 dose, then 200mg  / day for another 20 days  02/11/24 repeat EGD with Dr. San - Benign- appearing esophageal stenosis. - Esophageal plaques were found, suspicious for candidiasis. Biopsied. - Z- line regular, 38 cm from the incisors. - 2 cm hiatal hernia. - Multiple gastric polyps. - Normal examined duodenum. Biopsies again showed candida esophagitis. Repeat course of fluconazole  for hopeful eradication.    Interval History       Patient presents for follow-up on esophageal dysphagia secondary to candida esophagitis,esophageal stricture. Patient very tearful today and reports she just wants to feel better.  Patient treated for candida 2 rounds and dilatation on second endoscopy. She is on long term steroids for her Addison's disease and has required antibiotics for various infections. She comes in today without much improvement in symptoms.  Patient has had nausea and vomiting intermittently for the past 2 weeks. She is forcefully coughing with trying to swallow her pills. She has been able to keep liquids down, but any food she tries to swallow comes back up. She has been drinking protein shakes for nutrition.  Patient reports last 3 days she has had dark blood in vomit.  Patient notes she has seen dried dark blood in mouth and back of throat   She is taking Dexalansoprazole 60 mg po daily  and famotidine  liquid at bedtime for the GERD. If she had breakthrough symptoms she uses the Carafate  liquid.  She has lidocaine  swish and GI cocktails prn for throat pain.  She has used Benadryl  and Ativan  via PICC for nausea. She  is out of the ativan  and requests refill today. The benadryl  alone is not controlled the nausea and vomiting.  She reports she has allergies to ondansetron , promethazine, compazine, and scopolamine .    Patient is currently on 5L N/C continuous.  Patient on baby aspirin  81mg  po daily.    Wt Readings from Last 3 Encounters:  04/08/24 171 lb (77.6 kg)  03/25/24 171 lb (77.6 kg)  03/02/24 164 lb 3.2 oz (74.5 kg)    Past Medical History:  Diagnosis Date   Addison's disease (HCC)    Adrenal insufficiency (HCC)    Anemia    Anxiety    Aortic stenosis    Aortic stenosis    Appendicitis 12/19/2009   Appendicitis    Breast cancer (HCC)    STATUS POST BILATERAL MASTECTOMY. STATUS POST RECONSTRUCTION. SHE HAD SILICONE BREAST IMPLANTS AND THE LEFT IMPLANT IS LEAKING SLIGHTLY   Cellulitis of right middle finger 11/07/2018   Cervical cancer (HCC) 12/23/2018   Chest pain    CHF with right heart failure (HCC) 04/17/2017   Chronic respiratory failure with hypoxia (HCC) 12/23/2018   CKD (chronic kidney disease) stage 3, GFR 30-59 ml/min (HCC)    Cough variant asthma 04/13/2019   Depression    Functional neurological symptom disorder with mixed symptoms 2023   GERD (gastroesophageal reflux disease)    takes Dexilant  and carafate  and gi coctail    Headache    migraines on a daily and monthly regimen    Heart murmur    History of kidney stones    Hodgkin lymphoma (HCC)    STATUS POST MANTLE RADIATION   Hodgkin's lymphoma (HCC)    1987   Hypertension    Hypoxia    Multiple lung nodules    bilateral   Necrotizing fasciitis (HCC) 12/23/2018   Non-ischemic cardiomyopathy (HCC)    Osteoporosis    Ovarian tumor (benign)    bilateral   Palpitations    Pituitary adenoma (HCC) 12/23/2018   Pneumonia    PONV (postoperative nausea and vomiting)    Pre-diabetes    per pt; no meds   Pulmonary hypertension (HCC) 12/23/2018   Raynaud phenomenon    Right heart failure (HCC) 04/17/2017    Seizures (HCC)    last febrile seizure was approx 3 weeks ago per report on 12/01/2020   Supplemental oxygen  dependent    3 liters   SVT (supraventricular tachycardia) (HCC)    Tachycardia    Thyroid  cancer (HCC)    STATUS POST SURGICAL REMOVAL-CURRENT ON THYROID  REPLACEMENT    Past Surgical History:  Procedure Laterality Date   ABDOMINAL HYSTERECTOMY     AMPUTATION Left 01/30/2019   Procedure: Left Index finger amputation with flap reconstruction and repair reconstruction;  Surgeon: Camella Fallow, MD;  Location: [facility];  Service: Orthopedics;  Laterality: Left;   APPENDECTOMY     BIOPSY OF SKIN SUBCUTANEOUS TISSUE AND/OR MUCOUS MEMBRANE  12/26/2023   Procedure: BIOPSY, SKIN, SUBCUTANEOUS TISSUE, OR MUCOUS MEMBRANE;  Surgeon: Leigh Elspeth SQUIBB, MD;  Location: [facility];  Service: Gastroenterology;;   breast implants and removal      breast implants but leaking      CARDIAC CATHETERIZATION  05/18/2009   NORMAL CATH   CESAREAN SECTION  2007  and 1997   COLONOSCOPY     ESOPHAGEAL DILATION  02/11/2024   Procedure: DILATION, ESOPHAGUS;  Surgeon: San Sandor GAILS, DO;  Location: [facility];  Service: Gastroenterology;;   ESOPHAGOGASTRODUODENOSCOPY N/A 12/26/2023   Procedure: EGD (ESOPHAGOGASTRODUODENOSCOPY);  Surgeon: Leigh Elspeth SQUIBB, MD;  Location: [facility];  Service: Gastroenterology;  Laterality: N/A;   ESOPHAGOGASTRODUODENOSCOPY N/A 02/11/2024   Procedure: EGD (ESOPHAGOGASTRODUODENOSCOPY);  Surgeon: San Sandor GAILS, DO;  Location: [facility];  Service: Gastroenterology;  Laterality: N/A;   HOT HEMOSTASIS N/A 12/26/2023   Procedure: EGD, WITH ARGON PLASMA COAGULATION;  Surgeon: Leigh Elspeth SQUIBB, MD;  Location: [facility];  Service: Gastroenterology;  Laterality: N/A;   hx of chemotherapy      hx of radiation therapy      I & D EXTREMITY Left 12/23/2018   Procedure: IRRIGATION AND DEBRIDEMENT HAND / INDEX FINGER;  Surgeon: Camella Fallow, MD;  Location: [facility];  Service: Orthopedics;  Laterality: Left;   IR CV LINE INJECTION  03/22/2022   IR FLUORO GUIDE CV LINE RIGHT  10/09/2022   IR FLUORO GUIDE CV LINE RIGHT  01/18/2023   IR FLUORO GUIDE CV LINE RIGHT  03/26/2023   IR FLUORO GUIDE CV LINE RIGHT  10/10/2023   IR FLUORO GUIDE CV LINE RIGHT  01/06/2024   IR IMAGING GUIDED PORT INSERTION  05/06/2020   IR IMAGING GUIDED PORT INSERTION  12/04/2021   IR IMAGING GUIDED PORT INSERTION  02/25/2024   IR PATIENT EVAL TECH 0-60 MINS  03/12/2024   IR RADIOLOGIST EVAL & MGMT  03/12/2024   IR RADIOLOGIST EVAL & MGMT  03/31/2024   IR REMOVAL TUN ACCESS W/ PORT W/O FL MOD SED  04/27/2020   IR REMOVAL TUN ACCESS W/ PORT W/O FL MOD SED  12/04/2021   IR REMOVAL TUN ACCESS W/ PORT W/O FL MOD SED  03/30/2022   IR REMOVAL TUN ACCESS W/ PORT W/O FL MOD SED  02/27/2024   IR REMOVAL TUN CV CATH W/O FL  02/25/2024   IR US  GUIDE VASC ACCESS RIGHT  10/09/2022   KIDNEY STONE SURGERY     LUMBAR PUNCTURE W/ INTRATHECAL CHEMOTHERAPY     MASTECTOMY     PITUITARY SURGERY     RIGHT/LEFT HEART CATH AND CORONARY ANGIOGRAPHY N/A 04/02/2018   Procedure: RIGHT/LEFT HEART CATH AND CORONARY ANGIOGRAPHY;  Surgeon: Verlin Lonni BIRCH, MD;  [facility]: [facility];  Service: Cardiovascular;  Laterality: N/A;   RIGHT/LEFT HEART CATH AND CORONARY ANGIOGRAPHY N/A 08/31/2022   Procedure: RIGHT/LEFT HEART CATH AND CORONARY ANGIOGRAPHY;  Surgeon: Cherrie Toribio SAUNDERS, MD;  Location: [facility];  Service: Cardiovascular;  Laterality: N/A;   TOOTH EXTRACTION N/A 12/05/2020   Procedure: DENTAL RESTORATION/EXTRACTIONS;  Surgeon: Arvil Lonni RAMAN, MD;  Location: [facility];  Service: Oral Surgery;  Laterality: N/A;   TOTAL THYROIDECTOMY     VIDEO BRONCHOSCOPY Bilateral 11/14/2018   Procedure: VIDEO BRONCHOSCOPY WITHOUT FLUORO;  Surgeon: Kassie Acquanetta Bradley, MD;  Location: [facility];  Service: Cardiopulmonary;  Laterality: Bilateral;   VIDEO BRONCHOSCOPY WITH ENDOBRONCHIAL  ULTRASOUND N/A 11/19/2018   Procedure: VIDEO BRONCHOSCOPY WITH ENDOBRONCHIAL ULTRASOUND;  Surgeon: Kassie Acquanetta Bradley, MD;  Location: [facility];  Service: Thoracic;  Laterality: N/A;    Current Outpatient Medications  Medication Sig Dispense Refill   albuterol  (PROVENTIL ) (2.5 MG/3ML) 0.083% nebulizer solution INHALE 3 ML BY NEBULIZATION EVERY 6 HOURS AS NEEDED FOR WHEEZING OR SHORTNESS OF BREATH 225 mL 1   albuterol  (VENTOLIN  HFA) 108 (90 Base) MCG/ACT  inhaler Inhale 2 puffs into the lungs every 6 (six) hours as needed for wheezing or shortness of breath. 8 g 6   amoxicillin -clavulanate (AUGMENTIN ) 875-125 MG tablet Take 1 tablet by mouth 2 (two) times daily. 14 tablet 0   aspirin  EC 81 MG tablet Take 1 tablet (81 mg total) by mouth daily. Swallow whole. (Patient taking differently: Take 81 mg by mouth at bedtime. Chewable) 90 tablet 3   Bismuth Tribromoph-Petrolatum (XEROFORM OCCLUSIVE GAUZE STRIP) PADS Apply 1 each topically as directed. 50 each 1   bumetanide  (BUMEX ) 1 MG tablet Take 1 tablet (1 mg total) by mouth 2 (two) times daily. 180 tablet 3   buPROPion  (WELLBUTRIN  XL) 150 MG 24 hr tablet TAKE 1 TABLET BY MOUTH EVERY DAY 90 tablet 0   clonazePAM  (KLONOPIN ) 1 MG tablet Take 1 mg tablet TID PRN and 1 extra for severe anxiety up to 10 a month. 100 tablet 0   dexlansoprazole  (DEXILANT ) 60 MG capsule Take 1 capsule (60 mg total) by mouth daily. 90 capsule 3   diclofenac  (VOLTAREN ) 50 MG EC tablet Take 50 mg by mouth 2 (two) times daily. *as needed for migraine, Can repeat if needed*     diphenhydrAMINE  (BENADRYL ) 12.5 MG/5ML elixir Take 10 mLs (25 mg total) by mouth every 6 (six) hours as needed (nausea). 120 mL 0   doxycycline  (VIBRAMYCIN ) 100 MG capsule Take 1 capsule (100 mg total) by mouth 2 (two) times daily. 14 capsule 0   EMGALITY  120 MG/ML SOSY Inject 120 mg into the skin every 28 (twenty-eight) days.     EPINEPHRINE  0.3 mg/0.3 mL IJ SOAJ injection INJECT 0.3 MG INTO THE MUSCLE AS NEEDED  FOR ANAPHYLAXIS. 2 each 1   famotidine  (PEPCID ) 40 MG/5ML suspension Take 2.5 mLs (20 mg total) by mouth daily. 75 mL 0   FLORASTOR 250 MG capsule Take 250 mg by mouth 2 (two) times daily. Mid Morning and Mid Afternoon     FLUoxetine  (PROZAC ) 20 MG capsule Take 1 capsule (20 mg total) by mouth daily. 90 capsule 0   fluticasone -salmeterol (ADVAIR HFA) 230-21 MCG/ACT inhaler Inhale 2 puffs into the lungs 2 (two) times daily. 1 each 11   Heparin  Na, Pork, Lock Flsh PF (BD HEPARIN  POSIFLUSH) 100 UNIT/ML SOLN USE 5 MLS IN PORT A CATH ONCE DAILY AFTER MEDICATION ADMINISTRATION AS A HEPLOCK AS DIRECTED. 150 mL 3   hydrocortisone  (CORTEF ) 10 MG tablet Take 1-2 tablets (10-20 mg total) by mouth See admin instructions. Take 20 mg in the am and 10mg  in the evening. Take after completing prednisone  (Patient taking differently: Take 10-20 mg by mouth See admin instructions. Take 20 mg in the am at 10 am and 10 mg in the afternoon. Take after completing prednisone )     hyoscyamine  (LEVSIN /SL) 0.125 MG SL tablet Place 1 tablet (0.125 mg total) under the tongue every 6 (six) hours as needed (esophageal spasm). Up to 1.25 mg daily 30 tablet 1   lamoTRIgine  (LAMICTAL ) 200 MG tablet TAKE 1 TABLET BY MOUTH EVERYDAY AT BEDTIME 30 tablet 2   lamoTRIgine  (LAMICTAL ) 25 MG tablet Take two tablets every morning. 60 tablet 2   Lancets (ONETOUCH DELICA PLUS LANCET33G) MISC 3 (three) times daily. for testing     lidocaine  (XYLOCAINE ) 2 % solution Use as directed 10 mLs in the mouth or throat every 4 (four) hours as needed (for esophageal pain and pain with swallowing.). 100 mL 1   LORazepam  (ATIVAN ) 0.5 MG tablet Take 1 tablet (0.5  mg total) by mouth 3 (three) times daily as needed for anxiety. (Patient taking differently: Place 0.5 mg into feeding tube 3 (three) times daily as needed for anxiety. Access Port For Nausea) 12 tablet 0   nitroGLYCERIN  (NITROSTAT ) 0.4 MG SL tablet DISSOLVE 1 TAB UNDER THE TONGUE EVERY 5 MINUTES AS  NEEDED FOR CHEST PAIN. MAX OF 3 DOSES, THEN 911. 75 tablet 0   NURTEC 75 MG TBDP Take 1 tablet by mouth daily as needed.     OLANZapine  (ZYPREXA ) 5 MG tablet Take 1 tablet (5 mg total) by mouth at bedtime. 30 tablet 2   ONETOUCH VERIO test strip 3 (three) times daily. for testing     OXYGEN  Inhale 4-5 L/min into the lungs continuous.     potassium chloride  (KLOR-CON ) 10 MEQ tablet as needed.     PROAIR  HFA 108 (90 Base) MCG/ACT inhaler Inhale 2 puffs into the lungs every 4 (four) hours as needed for wheezing or shortness of breath.     QVAR  REDIHALER 80 MCG/ACT inhaler Inhale 1 puff into the lungs 2 (two) times daily.     rosuvastatin  (CRESTOR ) 10 MG tablet Take 10 mg by mouth in the morning. Mid morning     sacubitril -valsartan  (ENTRESTO ) 24-26 MG TAKE 1 TABLET BY MOUTH TWICE A DAY (Patient taking differently: Take 1 tablet by mouth daily.) 60 tablet 5   sodium chloride  flush (BD POSIFLUSH) 0.9 % SOLN injection Use as directed for catheter maintenance 600 mL 3   Sodium Chloride  Flush (NORMAL SALINE FLUSH) 0.9 % SOLN Use as directed for central venous catheter maintenance 600 mL 3   Sodium Chloride  Flush (NORMAL SALINE FLUSH) 0.9 % SOLN Use as directed for catheter maintenance 600 mL 3   Sodium Chloride  Flush (NORMAL SALINE FLUSH) 0.9 % SOLN Use 10 mLs as directed for catheter maintenance. 600 mL 3   Sodium Chloride  Flush (NORMAL SALINE FLUSH) 0.9 % SOLN Use as directed for catheter maintenance. 600 mL 3   sucralfate  (CARAFATE ) 1 GM/10ML suspension Take 1 g by mouth daily as needed (as directed for ulcers).     SYNTHROID  100 MCG tablet Take 1 tablet (100 mcg total) by mouth every morning. (Patient taking differently: Take 100 mcg by mouth every morning. * Must be name brand*)     Tiotropium Bromide  Monohydrate (SPIRIVA  RESPIMAT) 1.25 MCG/ACT AERS Inhale 2 puffs into the lungs daily. 4 g 11   topiramate  (TOPAMAX ) 50 MG tablet Take 150 mg by mouth at bedtime.     zolpidem  (AMBIEN  CR) 12.5 MG CR  tablet TAKE ONE TABLET BY MOUTH AT BEDTIME AS NEEDED FOR SLEEP. 90 tablet 0   ALPRAZolam  (XANAX ) 1 MG tablet 1 tablet daily and 1 tablet PRN no more than 10 days a month. (Patient not taking: Reported on 04/08/2024) 40 tablet 2   No current facility-administered medications for this visit.    Allergies as of 04/08/2024 - Review Complete 04/08/2024  Allergen Reaction Noted   Ferrous bisglycinate chelate [iron] Anaphylaxis and Other (See Comments) 07/15/2020   Mushroom extract complex (obsolete) Anaphylaxis 04/23/2019   Na ferric gluc cplx in sucrose Anaphylaxis 11/16/2014   Cymbalta [duloxetine hcl] Swelling and Anxiety 02/06/2011   Hydromorphone  Other (See Comments) 05/08/2016   Ondansetron  hcl Other (See Comments) 05/22/2022   Promethazine Other (See Comments) 08/03/2021   Succinylcholine Other (See Comments) 12/23/2018   Buprenorphine hcl Hives 02/03/2015   Compazine Other (See Comments) 02/06/2011   Duloxetine  02/06/2011   Metoclopramide Other (See Comments) 05/08/2016  Morphine and codeine Hives 02/06/2011   Ondansetron  Hives and Rash 02/03/2015   Promethazine hcl Hives 02/06/2011   Tegaderm ag mesh [silver ] Rash 02/06/2011    Family History  Adopted: Yes  Family history unknown: Yes    Review of Systems:    Constitutional: No weight loss, fever, chills, weakness or fatigue HEENT: Eyes: No change in vision               Ears, Nose, Throat:  No change in hearing or congestion Skin: No rash or itching Cardiovascular: No chest pain, chest pressure or palpitations   Respiratory: No SOB or cough Gastrointestinal: See HPI and otherwise negative Genitourinary: No dysuria or change in urinary frequency Neurological: No headache, dizziness or syncope Musculoskeletal: No new muscle or joint pain Hematologic: No bleeding or bruising Psychiatric: No history of depression or anxiety    Physical Exam:  Vital signs: BP 132/76   Pulse 100   Ht 5' 5 (1.651 m)   Wt 171 lb (77.6  kg)   LMP  (LMP Unknown)   SpO2 99%   BMI 28.46 kg/m   Constitutional: Tearful ill appearing female, alert and cooperative Throat: Oral cavity and pharynx without inflammation, swelling or lesion.  Respiratory: Respirations even and unlabored. Lung sounds diminished  to auscultation bilaterally.   No wheezes, crackles, or rhonchi. On N/C on 5L Cardiovascular: Murmur noted. No cyanosis or pallor.  Gastrointestinal:  Soft, nondistended, nontender. No rebound or guarding. Normal bowel sounds. No appreciable masses or hepatomegaly. Rectal:  Not performed.  Msk:  Symmetrical without gross deformities. Lymphedema noted to upper extremities and lower extremities.  Neurologic:  Alert and  oriented x4;  grossly normal neurologically.  Skin:   Dry and intact without significant lesions or rashes. Psychiatric: Oriented to person, place and time. Demonstrates good judgement and reason without abnormal affect or behaviors.  RELEVANT LABS AND IMAGING: CBC    Latest Ref Rng & Units 03/25/2024    9:10 PM 12/07/2023   12:59 PM 09/03/2023    1:15 PM  CBC  WBC 4.0 - 10.5 K/uL 12.3  9.6  13.6   Hemoglobin 12.0 - 15.0 g/dL 9.2  9.6  9.6   Hematocrit 36.0 - 46.0 % 30.9  31.2  31.1   Platelets 150 - 400 K/uL 393  370  332      CMP     Latest Ref Rng & Units 03/25/2024    8:29 PM 12/07/2023   12:59 PM 09/03/2023    1:15 PM  CMP  Glucose 70 - 99 mg/dL 887  79  829   BUN 6 - 20 mg/dL 29  28  28    Creatinine 0.44 - 1.00 mg/dL 8.68  8.68  8.54   Sodium 135 - 145 mmol/L 137  141  141   Potassium 3.5 - 5.1 mmol/L 3.5  3.6  3.2   Chloride 98 - 111 mmol/L 99  104  105   CO2 22 - 32 mmol/L 27  28  26    Calcium  8.9 - 10.3 mg/dL 9.8  9.1  8.4   Total Protein 6.5 - 8.1 g/dL 7.5   6.8   Total Bilirubin 0.0 - 1.2 mg/dL 0.4   0.3   Alkaline Phos 38 - 126 U/L 58   56   AST 15 - 41 U/L 28   18   ALT 0 - 44 U/L 33   25      Lab Results  Component Value Date   TSH  0.508 04/23/2023   04/25/23 Echo-Left  ventricular ejection fraction, by estimation, is 60 to 65%.   Procedures: 12/26/23 EGD at [facility] with Dr. [name] landmarks identified. - 2 cm hiatal hernia. - Severe esophageal candiasis is causing her symptoms. Biopsied. - Suspected benign- appearing esophageal stenosis in the proximal esophagus - not dilated today given severity of esophageal candiasis. - Multiple gastric polyps. Likely fundic gland polyps. Biopsied. - A single recently bleeding angiodysplastic lesion in the stomach. Treated with argon plasma coagulation ( APC) . - Normal stomach otherwise. - Normal examined duodenum. Plan: Start fluconazole  400mg  x 1 dose, then 200mg  / day for another 20 days  02/11/24 repeat EGD with Dr. San - Benign- appearing esophageal stenosis. - Esophageal plaques were found, suspicious for candidiasis. Biopsied. - Z- line regular, 38 cm from the incisors. - 2 cm hiatal hernia. - Multiple gastric polyps. - Normal examined duodenum. Biopsies again showed candida esophagitis. Repeat course of fluconazole  for hopeful eradication.   08/01/2020 colonoscopy, recall 10 years Impression Nonthrombosed external hemorrhoids found on perianal exam Internal hemorrhoids The examination was otherwise normal No specimens collected. 08/01/2020 EGD Findings Normal esophagus 3 cm hiatal hernia Multiple fundic gland polyps Normal examined duodenum No source of hematemesis is identified Biopsies of the greater curvature of the gastric body, lesser curvature of the gastric body, at the incisura, on the greater curvature of the gastric atrium and on the lesser curvature of the gastric atrium. Path stomach biopsy Atrial and oxyntic mucosa with no significant pathologic findings.  Negative for H. Pylori. 09/16/23 MBSS-  Clinical Impression:  Patient presents with a normal oropharyngeal swallow however she does present with a suspected primary esophageal dysphagia as per this modified barium swallow  study. 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 Thicker barium consistencies (honey thick, pudding thick and mechanical soft solids did transit a little slowly through PES and upper esophagus but no retrograde movement or stasis of barium visualized in upper esophagus or at level of PES. Patient attempted to swallow barium tablet with plain water  but she gagged while tablet still in mouth and spit it out. She reports this is something that can occur without warning when she is eating or taking medications. No aspiration or penetration observed and oral and pharyngeal structures all appeared to be [facility]. SLP recommended patient see an outpatient SLP for help with reintroducing solid foods into her diet.  12/07/23 CT Soft tissue Neck WO contrast IMPRESSION: 1. No acute or focal lesion to explain the patient's symptoms. No radiopaque foreign body or soft tissue nodule to account for a medicine tablet or capsule in the neck. 2. Ill-defined airspace opacities in the right upper lobe raise concern for pneumonia.   11/15/2010 EGD      Assessment: Encounter Diagnoses  Name Primary?   Esophageal dysphagia Yes   Esophageal candidiasis (HCC)    Gastroesophageal reflux disease with esophagitis and hemorrhage    Benign esophageal stricture    Chronic nausea    Hemoptysis    Poor appetite     53 year old female patient with extensive medical history who presents with complaint of esophageal dysphagia, throat pain, and odynophagia.  March EGD revealed severe esophageal Candida's with benign-appearing esophageal stenosis.  A single bleeding angiodysplastic lesion was treated with APC.  Patient was treated with fluconazole  p.o. extended treatment.  Follow-up EGD in [place] shows some improvement and esophageal Candida however still present.  Patient was treated with second round of fluconazole .  Patient presents today with persistent symptoms.  She has been taking  the Dexlansoprazole  60 mg p.o. daily with Pepcid  liquid as needed for GERD.  Patient is also using GI cocktail and lidocaine  swish as needed for throat pain and odynophagia.  Patient is only able to consume soft pured diet and has difficulty with pills.  Over the last 3 days patient has had some nausea and vomiting and noted blood.  Suspect possible irritation or Mallory weiss tear?  Patient unfortunately unable to tolerate most antiemetics without side effects.  Patient responds to Benadryl  and Ativan  will send short prescription of Ativan  to control nausea and vomiting.  Will schedule follow-up upper GI endoscopy in [place] to evaluate if lingering or recurrent Candida.  Patient Rigo Letts need referral to the ID clinic pending results.   Plan: -  Recheck CBC, BMET today -Cont Dexalansoprazole 60 mg po daily - Continue famotidine  liquid once to twice daily prn breakthrough symptoms -Continue GI cocktail prn  -Continue Soft pureed diet  - Short script ativan  0.5 mg prn sent for nausea, ok with Dr. San -  Will schedule  repeat upper endoscopy at [facility] Endoscopy unit for reevaluation of the proximal esophageal stricture along with confirmation of eradication of Candida esophagitis with Dr. Albesa - If lingering or recurrent Candida, will plan for referral back to the ID clinic.     Thank you for the courtesy of this consult. Please call me with any questions or concerns.   Tonga Prout, FNP-C [facility] 04/08/2024, 12:27 PM  Cc: Toppin, Jean M, MD

## 2024-04-08 ENCOUNTER — Encounter: Payer: Self-pay | Admitting: Gastroenterology

## 2024-04-08 ENCOUNTER — Other Ambulatory Visit (INDEPENDENT_AMBULATORY_CARE_PROVIDER_SITE_OTHER)

## 2024-04-08 ENCOUNTER — Ambulatory Visit (INDEPENDENT_AMBULATORY_CARE_PROVIDER_SITE_OTHER): Admitting: Gastroenterology

## 2024-04-08 VITALS — BP 132/76 | HR 100 | Ht 65.0 in | Wt 171.0 lb

## 2024-04-08 DIAGNOSIS — R042 Hemoptysis: Secondary | ICD-10-CM

## 2024-04-08 DIAGNOSIS — K222 Esophageal obstruction: Secondary | ICD-10-CM

## 2024-04-08 DIAGNOSIS — R630 Anorexia: Secondary | ICD-10-CM

## 2024-04-08 DIAGNOSIS — R1310 Dysphagia, unspecified: Secondary | ICD-10-CM

## 2024-04-08 DIAGNOSIS — B3781 Candidal esophagitis: Secondary | ICD-10-CM

## 2024-04-08 DIAGNOSIS — K2101 Gastro-esophageal reflux disease with esophagitis, with bleeding: Secondary | ICD-10-CM

## 2024-04-08 DIAGNOSIS — R1319 Other dysphagia: Secondary | ICD-10-CM | POA: Diagnosis not present

## 2024-04-08 DIAGNOSIS — F32A Depression, unspecified: Secondary | ICD-10-CM

## 2024-04-08 DIAGNOSIS — R110 Nausea: Secondary | ICD-10-CM

## 2024-04-08 LAB — CBC WITH DIFFERENTIAL/PLATELET
Basophils Absolute: 0.1 K/uL (ref 0.0–0.1)
Basophils Relative: 0.8 % (ref 0.0–3.0)
Eosinophils Absolute: 0.1 K/uL (ref 0.0–0.7)
Eosinophils Relative: 1 % (ref 0.0–5.0)
HCT: 31.2 % — ABNORMAL LOW (ref 36.0–46.0)
Hemoglobin: 9.7 g/dL — ABNORMAL LOW (ref 12.0–15.0)
Lymphocytes Relative: 15.9 % (ref 12.0–46.0)
Lymphs Abs: 1.5 K/uL (ref 0.7–4.0)
MCHC: 31.3 g/dL (ref 30.0–36.0)
MCV: 72.5 fl — ABNORMAL LOW (ref 78.0–100.0)
Monocytes Absolute: 0.5 K/uL (ref 0.1–1.0)
Monocytes Relative: 5.4 % (ref 3.0–12.0)
Neutro Abs: 7.3 K/uL (ref 1.4–7.7)
Neutrophils Relative %: 76.9 % (ref 43.0–77.0)
Platelets: 504 K/uL — ABNORMAL HIGH (ref 150.0–400.0)
RBC: 4.3 Mil/uL (ref 3.87–5.11)
RDW: 19.8 % — ABNORMAL HIGH (ref 11.5–15.5)
WBC: 9.5 K/uL (ref 4.0–10.5)

## 2024-04-08 LAB — BASIC METABOLIC PANEL WITH GFR
BUN: 27 mg/dL — ABNORMAL HIGH (ref 6–23)
CO2: 29 meq/L (ref 19–32)
Calcium: 9.8 mg/dL (ref 8.4–10.5)
Chloride: 102 meq/L (ref 96–112)
Creatinine, Ser: 1.46 mg/dL — ABNORMAL HIGH (ref 0.40–1.20)
GFR: 40.98 mL/min — ABNORMAL LOW (ref >60.00)
Glucose, Bld: 116 mg/dL — ABNORMAL HIGH (ref 70–99)
Potassium: 3.2 meq/L — ABNORMAL LOW (ref 3.5–5.1)
Sodium: 141 meq/L (ref 135–145)

## 2024-04-08 MED ORDER — LORAZEPAM 0.5 MG PO TABS: 0.5000 mg | ORAL_TABLET | Freq: Three times a day (TID) | ORAL | 0 refills | Status: DC | PRN

## 2024-04-08 NOTE — Patient Instructions (Addendum)
 Recommend Pureed diet Continue Dexalansoprazole Continue Famotidine  once to twice daily Continue GI cocktail prn  Your provider has requested that you go to the basement level for lab work before leaving today. Press B on the elevator. The lab is located at the first door on the left as you exit the elevator.  _______________________________________________________  If your blood pressure at your visit was 140/90 or greater, please contact your primary care physician to follow up on this.  _______________________________________________________  If you are age 45 or older, your body mass index should be between 23-30. Your Body mass index is 28.46 kg/m. If this is out of the aforementioned range listed, please consider follow up with your Primary Care Provider.  If you are age 41 or younger, your body mass index should be between 19-25. Your Body mass index is 28.46 kg/m. If this is out of the aformentioned range listed, please consider follow up with your Primary Care Provider.   ________________________________________________________  The [facility] providers would like to encourage you to use MYCHART to communicate with providers for non-urgent requests or questions.  Due to long hold times on the telephone, sending your provider a message by [facility] may be a faster and more efficient way to get a response.  Please allow 48 business hours for a response.  Please remember that this is for non-urgent requests.  _______________________________________________________  Thank you for trusting me with your gastrointestinal care. Deanna May, F-NP

## 2024-04-09 ENCOUNTER — Other Ambulatory Visit: Payer: Self-pay | Admitting: Adult Health

## 2024-04-09 ENCOUNTER — Other Ambulatory Visit: Payer: Self-pay

## 2024-04-09 ENCOUNTER — Telehealth: Payer: Self-pay | Admitting: Gastroenterology

## 2024-04-09 ENCOUNTER — Telehealth: Payer: Self-pay | Admitting: Adult Health

## 2024-04-09 ENCOUNTER — Ambulatory Visit: Payer: Self-pay | Admitting: Gastroenterology

## 2024-04-09 DIAGNOSIS — R070 Pain in throat: Secondary | ICD-10-CM

## 2024-04-09 DIAGNOSIS — K1379 Other lesions of oral mucosa: Secondary | ICD-10-CM

## 2024-04-09 DIAGNOSIS — R1319 Other dysphagia: Secondary | ICD-10-CM

## 2024-04-09 DIAGNOSIS — R1310 Dysphagia, unspecified: Secondary | ICD-10-CM

## 2024-04-09 DIAGNOSIS — F410 Panic disorder [episodic paroxysmal anxiety] without agoraphobia: Secondary | ICD-10-CM

## 2024-04-09 NOTE — Telephone Encounter (Signed)
 Pended.

## 2024-04-09 NOTE — Telephone Encounter (Signed)
 Inbound call from patient, states she is waking up with a mouth full of blood and feels weaker everyday. States she was expecting a call yesterday from Deanna in regards to next steps of care, patient did not receive a call and is concerned. She is unsure if repeat EGD is recommended or what else to do. Please advise. Thank you.

## 2024-04-09 NOTE — Telephone Encounter (Signed)
 Discussed plan with Deanna via secure chat & patient has been scheduled for EGD with Dr. San at [facility] on 7/14 at 8:00 am. Amb ref placed & instructions sent to mychart. Denies any blood thinners/diabetic medications. Advised her we'd be in touch regarding lab results once reviewed & if symptoms worsen prior to Monday to be seen in ED for sooner evaluation. Pt verbalized all understanding.

## 2024-04-09 NOTE — Telephone Encounter (Signed)
 Next appt is 04/21/24. Patient states that her Clonazepam  has not been approved yet. She takes it 2-3 times a day. The pharmacy will not give this to her until she calls us . [facility]  3 times a day. Pharmacy is:  [id number] [place], [place] - [address]   Phone: [phone]  Fax: [phone]

## 2024-04-09 NOTE — Telephone Encounter (Signed)
 Patient called to follow up with [name] on next steps, states their was a discussion about an EGD at [facility] ASAP. Also, requesting lab results. She woke up again this morning with a mouth full of dark blood (previously going on prior to OV). Patient is very tearful on the phone. Advised her I'd be in touch with next steps as soon as provider has reviewed.

## 2024-04-10 ENCOUNTER — Encounter (HOSPITAL_COMMUNITY): Payer: Self-pay | Admitting: Gastroenterology

## 2024-04-10 NOTE — Progress Notes (Signed)
 [name]   PCP-Toppin MD Cardiologist-Nahser MD Pulmonologist-Ellison MD   EKG-03/26/24 Echo-04/25/23 Cath-08/31/22 Stress-n/a ICD/PM- n/a GLP1-n/a Blood Thinner- n/a  HX:SVT,CHF,Murmur, HTN, Cardiomyopathy, Asthma, Seizures, BRCA, Cervical CA, Lymphoma, Thyroid  CA, Addisons, Adrenal Insufficiency, Pre DM. Patient sees pulm last ov 01/08/24 with f/u 6 months, is now weaned 02 down from 5L to 3L continuous. Also sees cardiology, last ov 03/02/24 with f/u in 2 months. Per patient no new breathing/cardiac issues, doesn't use equipment unless long distances d/t stamina. Patient was an add on, is waking up with blood in her mouth.  Anesthesia Review- Yes- seems urgent, ok at last cardio visit so ok to proceed

## 2024-04-11 NOTE — Anesthesia Preprocedure Evaluation (Signed)
 Anesthesia Evaluation  Patient identified by MRN, date of birth, ID band Patient awake    Reviewed: Allergy & Precautions, H&P , NPO status , Patient's Chart, lab work & pertinent test results  History of Anesthesia Complications (+) PONV and history of anesthetic complications  Airway Mallampati: I  TM Distance: >3 FB Neck ROM: full    Dental no notable dental hx.    Pulmonary asthma , COPD,  oxygen  dependent   Pulmonary exam normal breath sounds clear to auscultation       Cardiovascular hypertension, (-) angina +CHF  (-) Past MI Normal cardiovascular exam Rhythm:Regular Rate:Normal     Neuro/Psych  Headaches, Seizures -,  PSYCHIATRIC DISORDERS Anxiety Depression       GI/Hepatic ,GERD  Medicated and Controlled,,  Endo/Other  Hypothyroidism  Adrenal insufficiency  Renal/GU Renal diseaseLab Results      Component                Value               Date                                      K                        3.2 (L)             04/08/2024                 BUN                      27 (H)              04/08/2024                CREATININE               1.46 (H)            04/08/2024                GFR                      40.98 (L)           04/08/2024                   GLUCOSE                  116 (H)             04/08/2024                Musculoskeletal   Abdominal   Peds  Hematology Lab Results      Component                Value               Date                      WBC                      9.5                 04/08/2024                HGB  9.7 (L)             04/08/2024                HCT                      31.2 (L)            04/08/2024                MCV                      72.5 (L)            04/08/2024                PLT                      504.0 (H)           04/08/2024              Anesthesia Other Findings All: see list  Reproductive/Obstetrics                               Anesthesia Physical Anesthesia Plan  ASA: 3  Anesthesia Plan: MAC   Post-op Pain Management: Minimal or no pain anticipated   Induction: Intravenous  PONV Risk Score and Plan: 3 and Propofol  infusion and Treatment may vary due to age or medical condition  Airway Management Planned: Nasal Cannula  Additional Equipment: None  Intra-op Plan:   Post-operative Plan:   Informed Consent: I have reviewed the patients History and Physical, chart, labs and discussed the procedure including the risks, benefits and alternatives for the proposed anesthesia with the patient or authorized representative who has indicated his/her understanding and acceptance.     Dental advisory given  Plan Discussed with: CRNA, Anesthesiologist and Surgeon  Anesthesia Plan Comments: (EGD for Dysphagia; Throat pain; Bleeding in mouth; Odynophagia)        Anesthesia Quick Evaluation

## 2024-04-13 ENCOUNTER — Encounter (HOSPITAL_COMMUNITY): Payer: Self-pay | Admitting: Anesthesiology

## 2024-04-13 ENCOUNTER — Telehealth: Payer: Self-pay

## 2024-04-13 ENCOUNTER — Ambulatory Visit (HOSPITAL_COMMUNITY): Payer: Self-pay | Admitting: Anesthesiology

## 2024-04-13 ENCOUNTER — Encounter (HOSPITAL_COMMUNITY): Payer: Self-pay | Admitting: Gastroenterology

## 2024-04-13 ENCOUNTER — Other Ambulatory Visit: Payer: Self-pay

## 2024-04-13 ENCOUNTER — Ambulatory Visit (HOSPITAL_COMMUNITY)
Admission: RE | Admit: 2024-04-13 | Discharge: 2024-04-13 | Disposition: A | Discharge status: Home / Self Care | Attending: Gastroenterology | Admitting: Gastroenterology

## 2024-04-13 ENCOUNTER — Encounter (HOSPITAL_COMMUNITY)
Admission: RE | Disposition: A | Discharge status: Home / Self Care | Payer: Self-pay | Source: Home / Self Care | Attending: Gastroenterology

## 2024-04-13 DIAGNOSIS — K317 Polyp of stomach and duodenum: Secondary | ICD-10-CM | POA: Diagnosis not present

## 2024-04-13 DIAGNOSIS — I130 Hypertensive heart and chronic kidney disease with heart failure and stage 1 through stage 4 chronic kidney disease, or unspecified chronic kidney disease: Secondary | ICD-10-CM | POA: Insufficient documentation

## 2024-04-13 DIAGNOSIS — K229 Disease of esophagus, unspecified: Secondary | ICD-10-CM | POA: Diagnosis not present

## 2024-04-13 DIAGNOSIS — N183 Chronic kidney disease, stage 3 unspecified: Secondary | ICD-10-CM | POA: Insufficient documentation

## 2024-04-13 DIAGNOSIS — K2100 Gastro-esophageal reflux disease with esophagitis, without bleeding: Secondary | ICD-10-CM

## 2024-04-13 DIAGNOSIS — Z9981 Dependence on supplemental oxygen: Secondary | ICD-10-CM | POA: Diagnosis not present

## 2024-04-13 DIAGNOSIS — R1310 Dysphagia, unspecified: Secondary | ICD-10-CM | POA: Diagnosis not present

## 2024-04-13 DIAGNOSIS — K2289 Other specified disease of esophagus: Secondary | ICD-10-CM

## 2024-04-13 DIAGNOSIS — K449 Diaphragmatic hernia without obstruction or gangrene: Secondary | ICD-10-CM

## 2024-04-13 DIAGNOSIS — F410 Panic disorder [episodic paroxysmal anxiety] without agoraphobia: Secondary | ICD-10-CM

## 2024-04-13 DIAGNOSIS — N1831 Chronic kidney disease, stage 3a: Secondary | ICD-10-CM | POA: Diagnosis not present

## 2024-04-13 DIAGNOSIS — Z9221 Personal history of antineoplastic chemotherapy: Secondary | ICD-10-CM | POA: Insufficient documentation

## 2024-04-13 DIAGNOSIS — K222 Esophageal obstruction: Secondary | ICD-10-CM | POA: Diagnosis not present

## 2024-04-13 DIAGNOSIS — D131 Benign neoplasm of stomach: Secondary | ICD-10-CM

## 2024-04-13 DIAGNOSIS — K920 Hematemesis: Secondary | ICD-10-CM

## 2024-04-13 DIAGNOSIS — K1379 Other lesions of oral mucosa: Secondary | ICD-10-CM

## 2024-04-13 DIAGNOSIS — K209 Esophagitis, unspecified without bleeding: Secondary | ICD-10-CM

## 2024-04-13 DIAGNOSIS — R1319 Other dysphagia: Secondary | ICD-10-CM

## 2024-04-13 DIAGNOSIS — R070 Pain in throat: Secondary | ICD-10-CM

## 2024-04-13 DIAGNOSIS — I129 Hypertensive chronic kidney disease with stage 1 through stage 4 chronic kidney disease, or unspecified chronic kidney disease: Secondary | ICD-10-CM | POA: Diagnosis not present

## 2024-04-13 DIAGNOSIS — Z923 Personal history of irradiation: Secondary | ICD-10-CM | POA: Diagnosis not present

## 2024-04-13 DIAGNOSIS — R112 Nausea with vomiting, unspecified: Secondary | ICD-10-CM

## 2024-04-13 HISTORY — PX: ESOPHAGOGASTRODUODENOSCOPY: SHX5428

## 2024-04-13 SURGERY — EGD (ESOPHAGOGASTRODUODENOSCOPY)
Anesthesia: Monitor Anesthesia Care

## 2024-04-13 MED ORDER — DEXMEDETOMIDINE HCL IN NACL 80 MCG/20ML IV SOLN
INTRAVENOUS | Status: AC
  Filled 2024-04-13: qty 20

## 2024-04-13 MED ORDER — PROPOFOL 500 MG/50ML IV EMUL
INTRAVENOUS | Status: DC | PRN
  Administered 2024-04-13: 20 mg via INTRAVENOUS
  Administered 2024-04-13: 130 ug/kg/min via INTRAVENOUS
  Administered 2024-04-13: 50 mg via INTRAVENOUS

## 2024-04-13 MED ORDER — PROPOFOL 10 MG/ML IV BOLUS
INTRAVENOUS | Status: AC
  Filled 2024-04-13: qty 20

## 2024-04-13 MED ORDER — PROPOFOL 1000 MG/100ML IV EMUL
INTRAVENOUS | Status: AC
  Filled 2024-04-13: qty 100

## 2024-04-13 MED ORDER — LIDOCAINE HCL (CARDIAC) PF 100 MG/5ML IV SOSY
PREFILLED_SYRINGE | INTRAVENOUS | Status: DC | PRN
Start: 2024-04-13 — End: 2024-04-13
  Administered 2024-04-13: 50 mg via INTRAVENOUS

## 2024-04-13 MED ORDER — MIDAZOLAM HCL 5 MG/5ML IJ SOLN
INTRAMUSCULAR | Status: DC | PRN
  Administered 2024-04-13: 2 mg via INTRAVENOUS

## 2024-04-13 MED ORDER — FAMOTIDINE 40 MG/5ML PO SUSR: 20.0000 mg | Freq: Every day | ORAL | 6 refills | Status: AC

## 2024-04-13 MED ORDER — MIDAZOLAM HCL 2 MG/2ML IJ SOLN
INTRAMUSCULAR | Status: AC
  Filled 2024-04-13: qty 2

## 2024-04-13 MED ORDER — PHENYLEPHRINE 80 MCG/ML (10ML) SYRINGE FOR IV PUSH (FOR BLOOD PRESSURE SUPPORT)
PREFILLED_SYRINGE | INTRAVENOUS | Status: DC | PRN
Start: 2024-04-13 — End: 2024-04-13
  Administered 2024-04-13: 160 ug via INTRAVENOUS

## 2024-04-13 MED ORDER — DEXMEDETOMIDINE HCL IN NACL 80 MCG/20ML IV SOLN
INTRAVENOUS | Status: DC | PRN
  Administered 2024-04-13: 12 ug via INTRAVENOUS

## 2024-04-13 MED ORDER — SODIUM CHLORIDE 0.9 % IV SOLN: INTRAVENOUS | Status: DC

## 2024-04-13 MED ORDER — PROPOFOL 1000 MG/100ML IV EMUL
INTRAVENOUS | Status: AC
Start: 2024-04-13 — End: 2024-04-13
  Filled 2024-04-13: qty 100

## 2024-04-13 MED ORDER — DEXLANSOPRAZOLE 60 MG PO CPDR: 60.0000 mg | DELAYED_RELEASE_CAPSULE | Freq: Every day | ORAL | 5 refills | Status: DC

## 2024-04-13 NOTE — Telephone Encounter (Signed)
-----   Message from Sandor LULLA Flatter sent at 04/13/2024  9:25 AM EDT ----- This patient says she needs a refill of her Dexilant .  Please provide 90-day supply with RF 5.  Can you also please send in refill Rx for her liquid famotidine  which she takes on demand for breakthrough reflux.  Same 90-day supply with RF 5.  Thank you.

## 2024-04-13 NOTE — Interval H&P Note (Signed)
 History and Physical Interval Note:  Did well after the last EGD with dilation on 02/11/2024, with near resolution of dysphagia, improving reflux symptoms.  Has had recurrence of dysphagia, particularly pill dysphagia pointing to anterior neck/suprasternal notch, and more recently with nausea/vomiting with BRB mixed in emesis.  H/H last week was stable at 9.7/31.2.  No melena.  Still taking Dexilant  for reflux with Pepcid  on demand for any breakthrough.  Currently taking antibiotics for cellulitis along with continued steroids for underlying Addison's.  Completed stress dose steroids for procedure today.  Will proceed with EGD today for diagnostic and therapeutic intent.  04/13/2024 7:36 AM  [name]  has presented today for surgery, with the diagnosis of Dysphagia; Throat pain; Bleeding in mouth; Odynophagia.  The various methods of treatment have been discussed with the patient and family. After consideration of risks, benefits and other options for treatment, the patient has consented to  Procedure(s): EGD (ESOPHAGOGASTRODUODENOSCOPY) (N/A) as a surgical intervention.  The patient's history has been reviewed, patient examined, no change in status, stable for surgery.  I have reviewed the patient's chart and labs.  Questions were answered to the patient's satisfaction.     Sandor GAILS Shareka Casale

## 2024-04-13 NOTE — Telephone Encounter (Signed)
 Pt has an appt 04/30/24

## 2024-04-13 NOTE — Progress Notes (Signed)
 Agree with the assessment and plan as outlined by [facility], FNP-C.  Carlitos Bottino, DO, [facility]

## 2024-04-13 NOTE — Anesthesia Postprocedure Evaluation (Signed)
 Anesthesia Post Note  Patient: [name]  Procedure(s) Performed: EGD (ESOPHAGOGASTRODUODENOSCOPY)     Patient location during evaluation: Endoscopy Anesthesia Type: MAC Level of consciousness: awake and alert Pain management: pain level controlled Vital Signs Assessment: post-procedure vital signs reviewed and stable Respiratory status: spontaneous breathing, nonlabored ventilation, respiratory function stable and patient connected to nasal cannula oxygen  Cardiovascular status: blood pressure returned to baseline and stable Postop Assessment: no apparent nausea or vomiting Anesthetic complications: no   No notable events documented.  Last Vitals:  Vitals:   04/13/24 0832 04/13/24 0840  BP: (!) 103/50 120/75  Pulse: 87 87  Resp: 15 (!) 24  Temp: 36.4 C   SpO2: 96% 100%    Last Pain:  Vitals:   04/13/24 0840  TempSrc:   PainSc: 0-No pain                 Garnette DELENA Gab

## 2024-04-13 NOTE — Transfer of Care (Signed)
 Immediate Anesthesia Transfer of Care Note  Patient: [name]  Procedure(s) Performed: EGD (ESOPHAGOGASTRODUODENOSCOPY)  Patient Location: PACU and Endoscopy Unit  Anesthesia Type:MAC  Level of Consciousness: awake, alert , oriented, and patient cooperative  Airway & Oxygen  Therapy: Patient Spontanous Breathing and Patient connected to nasal cannula oxygen   Post-op Assessment: Report given to RN and Post -op Vital signs reviewed and stable  Post vital signs: Reviewed and stable  Last Vitals:  Vitals Value Taken Time  BP 103/50 04/13/24 08:32  Temp 36.4 C 04/13/24 08:32  Pulse 85 04/13/24 08:34  Resp 27 04/13/24 08:34  SpO2 97 % 04/13/24 08:34  Vitals shown include unfiled device data.  Last Pain:  Vitals:   04/13/24 0832  TempSrc: Temporal  PainSc:          Complications: No notable events documented.

## 2024-04-13 NOTE — Discharge Instructions (Signed)

## 2024-04-13 NOTE — Telephone Encounter (Signed)
 Karleen, pharmacist, says that he is not ok for patient to be on dual benzos. We had discontinued lorazepam  and only prescribed clonazepam  and she got an early RF last fill. GI provider has now prescribed lorazepam . Husband told pharmacist it is for nausea, but Rx says anxiety. He is not willing to fill either clonazepam  or lorazepam  at this time.

## 2024-04-13 NOTE — Telephone Encounter (Signed)
 Pt husband called in again today saying [facility] until they speak with someone at the office. They want to make sure we are aware she is taking a nausea medication that may counteract the clonazapam.

## 2024-04-13 NOTE — Op Note (Signed)
 [facility] Patient Name: [name] Procedure Date: 04/13/2024 MRN: [id number] Attending MD: Sandor Flatter , MD, [id number] Date of Birth: 05-27-1971 CSN: [id number] Age: 53 Admit Type: Outpatient Procedure:                Upper GI endoscopy Indications:              Dysphagia, Esophageal reflux, Hematemesis, Nausea                            with vomiting Providers:                Sandor Flatter, MD, Willy Hummer, RN, Felice Sar, Technician Referring MD:              Medicines:                Monitored Anesthesia Care Complications:            No immediate complications. Estimated Blood Loss:     Estimated blood loss was minimal. Procedure:                Pre-Anesthesia Assessment:                           - Prior to the procedure, a History and Physical                            was performed, and patient medications and                            allergies were reviewed. The patient's tolerance of                            previous anesthesia was also reviewed. The risks                            and benefits of the procedure and the sedation                            options and risks were discussed with the patient.                            All questions were answered, and informed consent                            was obtained. Prior Anticoagulants: The patient has                            taken no anticoagulant or antiplatelet agents. ASA                            Grade Assessment: III - A patient with severe                            systemic  disease. After reviewing the risks and                            benefits, the patient was deemed in satisfactory                            condition to undergo the procedure.                           After obtaining informed consent, the endoscope was                            passed under direct vision. Throughout the                            procedure, the patient's  blood pressure, pulse, and                            oxygen  saturations were monitored continuously. The                            [id number]-H190 ([id number]) Olympus endoscope was introduced                            through the mouth, and advanced to the second part                            of duodenum. The upper GI endoscopy was                            accomplished without difficulty. The patient                            tolerated the procedure well. Scope In: Scope Out: Findings:      One benign-appearing, intrinsic mild stenosis was found 16 cm from the       incisors. This stenosis measured 1 cm (in length). The stenosis was       traversed. A guidewire was placed and the scope was withdrawn. Dilation       was performed with a Savary dilator with mild resistance at 16 mm. The       dilation site was examined following endoscope reinsertion and showed       mild mucosal disruption and moderate improvement in luminal narrowing.       Estimated blood loss was minimal.      A few subtle white plaques were found in the lower third of the       esophagus. The mucosa in the middle and upper esophagus was otherwise       normal appearing. Biopsies were taken from the distal esophagus (jar 1)       and proximal esophagus (jar 2) with a cold forceps for histology.       Estimated blood loss was minimal.      The Z-line was regular and was found 38 cm from the incisors.      A small, sliding type 1-2 cm hiatal hernia was present.  Multiple small sessile polyps with no bleeding and no stigmata of recent       bleeding were found in the gastric fundus and in the gastric body.       Several of these polyps were removed with a cold biopsy forceps.       Resection and retrieval were complete. Estimated blood loss was minimal.      The mucosa was otherwise normal appearing throughout the stomach. No       areas of active bleeding or high-grade stigmata of bleeding to warrant       further  endoscopic intervention.      The examined duodenum was normal. Impression:               - Benign-appearing esophageal stenosis. This was                            dilated with a 16 mm Savary dilator with                            appropriate mucosal disruption, consistent with                            successful dilation.                           - Esophageal plaques were found, suspicious for                            candidiasis. Biopsied.                           - Z-line regular, 38 cm from the incisors.                           - A small, 1-2 cm sliding-type hiatal hernia.                           - Multiple gastric polyps. Resected and retrieved.                           - Normal mucosa was found in the entire stomach.                           - Normal examined duodenum. Moderate Sedation:      Not Applicable - Patient had care per Anesthesia. Recommendation:           - Patient has a contact number available for                            emergencies. The signs and symptoms of potential                            delayed complications were discussed with the                            patient. Return to normal activities tomorrow.  Written discharge instructions were provided to the                            patient.                           - Soft diet today, then advance as tolerated per                            postdilation protocol.                           - Continue present medications.                           - Await pathology results.                           - If ongoing dysphagia and/or reflux symptoms, plan                            for Esophageal Manometry and pH/impedance testing                            for further evaluation. Recommend doing                            pH/impedance testing on therapy to evaluate for                            breakthrough despite acid suppression medications.                            - If recurrent or ongoing esophageal candidiasis,                            plan for referral to the ID clinic for evaluation.                            May require ongoing antifungal therapy, or possibly                            recurrent courses of antifungal therapy when on                            antibiotics or stress dose steroids.                           - Return to GI clinic at appointment to be                            scheduled. Procedure Code(s):        --- Professional ---                           [phone], Esophagogastroduodenoscopy, flexible,  transoral; with biopsy, single or multiple Diagnosis Code(s):        --- Professional ---                           K22.2, Esophageal obstruction                           K22.9, Disease of esophagus, unspecified                           K44.9, Diaphragmatic hernia without obstruction or                            gangrene                           K31.7, Polyp of stomach and duodenum                           R13.10, Dysphagia, unspecified                           K21.9, Gastro-esophageal reflux disease without                            esophagitis                           K92.0, Hematemesis                           R11.2, Nausea with vomiting, unspecified CPT copyright 2022 American Medical Association. All rights reserved. The codes documented in this report are preliminary and upon coder review may  be revised to meet current compliance requirements. Sandor Flatter, MD 04/13/2024 8:37:33 AM Number of Addenda: 0

## 2024-04-13 NOTE — Telephone Encounter (Signed)
 Rx sent to pharmacy

## 2024-04-13 NOTE — Anesthesia Procedure Notes (Signed)
 Procedure Name: MAC Date/Time: 04/13/2024 7:55 AM  Performed by: Nada Corean CROME, CRNAPre-anesthesia Checklist: Emergency Drugs available, Patient identified, Suction available, Patient being monitored and Timeout performed Patient Re-evaluated:Patient Re-evaluated prior to induction Oxygen  Delivery Method: Simple face mask Preoxygenation: Pre-oxygenation with 100% oxygen  Induction Type: IV induction Placement Confirmation: positive ETCO2 Dental Injury: Teeth and Oropharynx as per pre-operative assessment  Comments: POM mask used

## 2024-04-14 LAB — SURGICAL PATHOLOGY

## 2024-04-14 MED ORDER — CLONAZEPAM 1 MG PO TABS: ORAL_TABLET | ORAL | 0 refills | Status: DC

## 2024-04-15 ENCOUNTER — Telehealth: Payer: Self-pay

## 2024-04-15 ENCOUNTER — Ambulatory Visit: Payer: Self-pay | Admitting: Gastroenterology

## 2024-04-15 ENCOUNTER — Other Ambulatory Visit: Payer: Self-pay

## 2024-04-15 DIAGNOSIS — K2100 Gastro-esophageal reflux disease with esophagitis, without bleeding: Secondary | ICD-10-CM

## 2024-04-15 DIAGNOSIS — R1319 Other dysphagia: Secondary | ICD-10-CM

## 2024-04-15 NOTE — Telephone Encounter (Signed)
 [name]
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
## 2024-04-15 NOTE — Addendum Note (Signed)
 Addended by: BENJAMINE NAT DEL on: 04/15/2024 03:00 PM   Modules accepted: Orders

## 2024-04-16 ENCOUNTER — Other Ambulatory Visit: Payer: Self-pay

## 2024-04-16 DIAGNOSIS — R1319 Other dysphagia: Secondary | ICD-10-CM

## 2024-04-16 DIAGNOSIS — F445 Conversion disorder with seizures or convulsions: Secondary | ICD-10-CM | POA: Diagnosis not present

## 2024-04-16 DIAGNOSIS — G43009 Migraine without aura, not intractable, without status migrainosus: Secondary | ICD-10-CM | POA: Diagnosis not present

## 2024-04-16 NOTE — Telephone Encounter (Signed)
 Left message for someone to return call to schedule SLP.  Will continue efforts.

## 2024-04-16 NOTE — Telephone Encounter (Signed)
 Left message for patient to return call to the office. Will continue efforts.

## 2024-04-17 ENCOUNTER — Telehealth (HOSPITAL_COMMUNITY): Payer: Self-pay | Admitting: *Deleted

## 2024-04-17 ENCOUNTER — Ambulatory Visit (INDEPENDENT_AMBULATORY_CARE_PROVIDER_SITE_OTHER): Admitting: Psychiatry

## 2024-04-17 DIAGNOSIS — F331 Major depressive disorder, recurrent, moderate: Secondary | ICD-10-CM | POA: Diagnosis not present

## 2024-04-17 NOTE — Telephone Encounter (Signed)
 Attempted to contact patient to schedule OP MBS. Left VM @ [phone]. RKEEL

## 2024-04-17 NOTE — Progress Notes (Signed)
 [facility] Counselor/Therapist Progress Note  Patient ID: [name], MRN: [id number],    Date: 04/17/2024  Time Spent: 55 minutes   Treatment Type: Individual Therapy  Virtual Visit via Telehealth Note: MyChart Video Connected with patient by a telemedicine/telehealth application, with their informed consent, and verified patient privacy and that I am speaking with the correct person using two identifiers. I discussed the limitations, risks, security and privacy concerns of performing psychotherapy and the availability of in person appointments. I also discussed with the patient that there may be a patient responsible charge related to this service. The patient expressed understanding and agreed to proceed. I discussed the treatment planning with the patient. The patient was provided an opportunity to ask questions and all were answered. The patient agreed with the plan and demonstrated an understanding of the instructions. The patient was advised to call  our office if  symptoms worsen or feel they are in a crisis state and need immediate contact.   Therapist Location: office Patient Location: home    Reported Symptoms:  anxiety, depression, hate, frustrations with self and others    Mental Status Exam:  Appearance:   Casual     Behavior:  Appropriate, Sharing, and Motivated  Motor:  Challenging due to health status  Speech/Language:   Clear and Coherent  Affect:  Depressed and anxious  Mood:  anxious and depressed  Thought process:  goal directed  Thought content:    WNL  Sensory/Perceptual disturbances:    WNL  Orientation:  oriented to person, place, time/date, situation, day of week, month of year, year, and stated date of April 17, 2024  Attention:  Good  Concentration:  Good and Fair  Memory:  Some memory issues related to illness and meds  Fund of knowledge:   Good  Insight:    Good and Fair  Judgment:   Good and Fair  Impulse Control:  Good and Fair    Risk Assessment: Danger to Self:  No Self-injurious Behavior: No Danger to Others: No Duty to Warn:no Physical Aggression / Violence:No  Access to Firearms a concern: No  Gang Involvement:No   Subjective:  Patient today reporting continued symptoms of depression, anxiety (has lessened some), hate, frustrations with self and others. Denies SI. Processing her depression, sadness, frustration, hate, and guilt, which we processed together, allowing her to vent her thoughts and feelings without judgement and look at ways of her being able to acknowledge her feelings in healthy ways so as to not feel isolated with them. Working some to let go of hate and the guilt.  Patient did well today in venting a lot of negativity and anger towards others and some in relation to herself.  Some self-defeating behaviors also shared and processed with some different reactions.  (Not all details included in this note due to patient privacy needs.) Next session is scheduled for next week.  Interventions: Cognitive Behavioral Therapy, Solution-Oriented/Positive Psychology, and Ego-Supportive Long-term goal:  Patient will confront her anxiety, fears, anger reported re: her illness and use time in therapy and beyond to work on this, while receiving feedback and support from therapist. Help her determine what she feels is realistic for her considering her health challenges and difficult prognosis.  Short-term goal: Participate in therapy and initiate communication of needs and desires at this point in her struggle physically.  Strategies: Share and talk through anxiety, anger, and depression that are experienced and work to find and use helpful coping tools to  better manage those feelings.    Diagnosis:   ICD-10-CM   1. Major depressive disorder, recurrent episode, moderate (HCC)  F33.1      Plan:     Patient today actively participating in session and working further on her strong feelings of anger,  frustration, hate, as well as her anxiety and depression.  Some decreased anger targeted at her husband and herself per her report.  Patient active in her work during session and openly expressing painful thoughts and feelings which she reports is helpful to release.  Did seem less angry and more calm by the end of session.  [name] continues to work to make progress, even though there are significant struggles at times due to her physical problems and diagnoses.  She continues to try and manage the challenges of her physical health while also paying attention to her emotional health and working to develop coping strategies that feel helpful and supportive to her.  Goal review and progress/challenges noted with patient.  Next appointment within 2 weeks.   Barnie Bunde, LCSW

## 2024-04-20 ENCOUNTER — Ambulatory Visit (INDEPENDENT_AMBULATORY_CARE_PROVIDER_SITE_OTHER): Admitting: Psychiatry

## 2024-04-20 DIAGNOSIS — F411 Generalized anxiety disorder: Secondary | ICD-10-CM

## 2024-04-20 DIAGNOSIS — J440 Chronic obstructive pulmonary disease with acute lower respiratory infection: Secondary | ICD-10-CM | POA: Diagnosis not present

## 2024-04-20 DIAGNOSIS — J4550 Severe persistent asthma, uncomplicated: Secondary | ICD-10-CM | POA: Diagnosis not present

## 2024-04-20 NOTE — Progress Notes (Signed)
 [facility] Counselor/Therapist Progress Note  Patient ID: RANETTE LUCKADOO, MRN: [id number],    Date: 04/20/2024  Time Spent: 50 minutes   Treatment Type: Individual Therapy  Virtual Visit via Telehealth Note: MyChart Telehealth session: not able to get video working and maintain it so finished session by phone Connected with patient by a telemedicine/telehealth application, with their informed consent, and verified patient privacy and that I am speaking with the correct person using two identifiers. I discussed the limitations, risks, security and privacy concerns of performing psychotherapy and the availability of in person appointments. I also discussed with the patient that there may be a patient responsible charge related to this service. The patient expressed understanding and agreed to proceed. I discussed the treatment planning with the patient. The patient was provided an opportunity to ask questions and all were answered. The patient agreed with the plan and demonstrated an understanding of the instructions. The patient was advised to call  our office if  symptoms worsen or feel they are in a crisis state and need immediate contact.   Therapist Location: office Patient Location: home  Reported Symptoms: anxiety, depression, envy, hate but it has decreased, frustration    Mental Status Exam:  Appearance:   N/A telephone session as video not working     Behavior:  Appropriate, Sharing, and Motivated  Motor:  Impacted by her physical status  Speech/Language:   Clear and Coherent  Affect:  N/A  video not working  Mood:  angry, anxious, depressed, and some irritability  Thought process:  goal directed  Thought content:    Rumination  Sensory/Perceptual disturbances:    WNL  Orientation:  oriented to person, place, time/date, situation, day of week, month of year, year, and stated date of July 21,2025  Attention:  Good  Concentration:  Fair  Memory:  Some memory issues   Fund of knowledge:   Good  Insight:    Good and Fair  Judgment:   Fair  Impulse Control:  Good and Fair   Risk Assessment: Danger to Self:  No Self-injurious Behavior: No Danger to Others: No Duty to Warn:no Physical Aggression / Violence:No  Access to Firearms a concern: No  Gang Involvement:No   Subjective: Patient today reporting symptoms of anxiety, depression, envy, hate, and some frustrations with herself and others. Added envy as a new symptom today and talked about it in session today. Talked about this further and how it relates to her and feelings about others compared to herself. Patient venting quite a while about and we also spoke about some of her positives especially in being able to travel including out of the county and seems to get along well in spite of her illness. Denies SI. Talking further about her hate and some guilt feelings. Continue to to hear patient in her talking through difficult symptoms and let her vent without interruption as she doesn't feel others hear her very much. Is looking forward to her next travels to the beach next weekend and seeing multiple families that she has known over the years. Encouraged her not to isolate and keep working toward letting go some of her hate and envy versus recycling it. Self-defeating at times and did work well in session today confronting some of her anger and negativity that definitely contributes to some of her feelings of envy, self-defeatedness, anger, and depression/anxiety.   Interventions: Cognitive Behavioral Therapy, Solution-Oriented/Positive Psychology, and Ego-Supportive Long-term goal:  Patient will confront her anxiety, fears, anger  reported re: her illness and use time in therapy and beyond to work on this, while receiving feedback and support from therapist. Help her determine what she feels is realistic for her considering her health challenges and difficult prognosis.  Short-term goal: Participate in  therapy and initiate communication of needs and desires at this point in her struggle physically.  Strategies: Share and talk through anxiety, anger, and depression that are experienced and work to find and use helpful coping tools to better manage those feelings.    Diagnosis:   ICD-10-CM   1. Generalized anxiety disorder  F41.1      Plan:   Patient today in session participating very well and continues to work on feelings of anger, envy, frustration, hate, anxiety, and depression.  Tendency to focus more heavily on the negatives versus a fair amount of positives that she has going for her right now in terms of friendships and despite her medical condition, being able to travel to a lot of destinations with family and friends.  Encouraged her venting her concerns about her health issues and diagnosis and also being able to let herself enjoy the travels that she is able to to make and trying not to assume and advance that something is going to go wrong on the travels as so far she has had some really good experiences which has helped her emotionally.  Definitely having time to process some of her anxiety and depression regarding her illness.  Less anger at times with more calmness at other times.  Patient continuing to confront the challenges of her physical health while also noticing some of the time and efforts of her family and friends that are helping support her in a lot of ways.  Continued work on coping strategies and thought patterns that are helpful versus destructive.  Is dealing with issues that were some present before her illness and some of the same behaviors and situations surfacing at times as her illness continues.  (Not all details included in this note due to patient privacy needs.)  Goal review and progress/challenges noted with patient.  Next appointment within 2 weeks.   Barnie Bunde, LCSW

## 2024-04-20 NOTE — Telephone Encounter (Signed)
 Left message for patient to return call.  She needs to call to speech pathology department to schedule SLP as they have received the order and are OK to schedule.  Will continue efforts.

## 2024-04-21 ENCOUNTER — Other Ambulatory Visit (HOSPITAL_COMMUNITY): Payer: Self-pay | Admitting: Gastroenterology

## 2024-04-21 ENCOUNTER — Ambulatory Visit: Admitting: Psychiatry

## 2024-04-21 DIAGNOSIS — R059 Cough, unspecified: Secondary | ICD-10-CM

## 2024-04-21 DIAGNOSIS — R1310 Dysphagia, unspecified: Secondary | ICD-10-CM

## 2024-04-21 NOTE — Telephone Encounter (Signed)
 Spoke with patient and made her aware that she should call Speech Pathology to scheduled modified barium swallow.  Provided patient with phone number.   Patient agreed to plan and verbalized understanding.  No further questions or concerns.

## 2024-04-21 NOTE — Telephone Encounter (Signed)
 Message sent to patient with instructions for SLP scheduled for 05-06-24 at [facility] at 11:30am.

## 2024-04-22 ENCOUNTER — Ambulatory Visit (HOSPITAL_COMMUNITY)
Admission: RE | Admit: 2024-04-22 | Discharge: 2024-04-22 | Disposition: A | Discharge status: Home / Self Care | Source: Ambulatory Visit | Attending: Gastroenterology | Admitting: Gastroenterology

## 2024-04-22 ENCOUNTER — Ambulatory Visit (INDEPENDENT_AMBULATORY_CARE_PROVIDER_SITE_OTHER): Admitting: Psychiatry

## 2024-04-22 ENCOUNTER — Ambulatory Visit: Admitting: Psychiatry

## 2024-04-22 ENCOUNTER — Ambulatory Visit (HOSPITAL_COMMUNITY)
Admission: RE | Admit: 2024-04-22 | Discharge: 2024-04-22 | Disposition: A | Discharge status: Home / Self Care | Source: Ambulatory Visit | Attending: Internal Medicine | Admitting: Internal Medicine

## 2024-04-22 DIAGNOSIS — E890 Postprocedural hypothyroidism: Secondary | ICD-10-CM | POA: Diagnosis not present

## 2024-04-22 DIAGNOSIS — J9611 Chronic respiratory failure with hypoxia: Secondary | ICD-10-CM | POA: Insufficient documentation

## 2024-04-22 DIAGNOSIS — Z8571 Personal history of Hodgkin lymphoma: Secondary | ICD-10-CM | POA: Insufficient documentation

## 2024-04-22 DIAGNOSIS — E240 Pituitary-dependent Cushing's disease: Secondary | ICD-10-CM | POA: Diagnosis not present

## 2024-04-22 DIAGNOSIS — R059 Cough, unspecified: Secondary | ICD-10-CM | POA: Insufficient documentation

## 2024-04-22 DIAGNOSIS — I890 Lymphedema, not elsewhere classified: Secondary | ICD-10-CM | POA: Diagnosis not present

## 2024-04-22 DIAGNOSIS — R1310 Dysphagia, unspecified: Secondary | ICD-10-CM | POA: Insufficient documentation

## 2024-04-22 DIAGNOSIS — Z853 Personal history of malignant neoplasm of breast: Secondary | ICD-10-CM | POA: Insufficient documentation

## 2024-04-22 DIAGNOSIS — R1319 Other dysphagia: Secondary | ICD-10-CM

## 2024-04-22 DIAGNOSIS — R09A2 Foreign body sensation, throat: Secondary | ICD-10-CM | POA: Diagnosis not present

## 2024-04-22 DIAGNOSIS — F331 Major depressive disorder, recurrent, moderate: Secondary | ICD-10-CM

## 2024-04-22 NOTE — Procedures (Signed)
 Modified Barium Swallow Study  Patient Details  Name: [name] MRN: [id number] Date of Birth: 10-14-1970  Today's Date: 04/22/2024  Modified Barium Swallow completed.  Full report located under Chart Review in the Imaging Section.  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 Patient with completion of MBS in 12/24 and FEES both reporting normal oropharyngeal swallow. Patient presents to outpatient MBS this date with continued complaints of globus sensation, trouble swallowing pills, coughing when eating/drinking and vocal changes. Patient reports currently consuming puree/thin textures with medications crushed as able.   Clinical Impression Patient presents with functional oropharyngeal swallow. Oral phase is characterized by no labial escape, brisk tongue motion and complete clearance of PO from the oral cavity. Pharyngeal phase is intact with no penetration/aspiration across all consistencies (thin liquids, puree) and complete clearance of pharyngeal residuals. SLP attempted to evaluate dysphagia 2 solids and pill via puree/thin, however patient expectorated entirety of bolus presented. Patient with occasional coughing during evaluation, though no penetration/aspiration present on fluoro. Observed suspected slowed esophageal motility during esophageal sweep. Recommend continuation of current diet with f/u from  GI and ENT. Encourage patient to trial solids for nutritional needs as she feels comfortable. Recommend medications crushed in puree.     Factors that may increase risk of adverse event in presence of aspiration Noe & Lianne 2021): Poor general health and/or compromised immunity  Swallow Evaluation Recommendations Recommendations: PO diet PO Diet Recommendation: Dysphagia 1 (Pureed);Thin liquids (Level 0) (patient unable to consume solids during MBS) Liquid Administration via: Cup;Straw Medication Administration: Crushed with puree Supervision: Intermittent supervision/cueing for swallowing strategies Swallowing strategies  : Slow rate;Small bites/sips Postural changes: Position pt fully upright for meals;Stay upright 30-60 min after meals Oral care recommendations: Oral care BID (2x/day) Recommended consults: Consider esophageal assessment;Consider ENT consultation;Consider GI consultation      Kaylanie Capili M.A., CCC-SLP 04/22/2024,12:50 PM

## 2024-04-22 NOTE — Progress Notes (Signed)
 [facility] Counselor/Therapist Progress Note  Patient ID: [name], MRN: [id number],    Date: 04/22/2024  Time Spent: 50 minutes   Treatment Type: Individual Therapy  Virtual Visit via Telehealth Note: MyChart Video session Connected with patient by a telemedicine/telehealth application, with their informed consent, and verified patient privacy and that I am speaking with the correct person using two identifiers. I discussed the limitations, risks, security and privacy concerns of performing psychotherapy and the availability of in person appointments. I also discussed with the patient that there may be a patient responsible charge related to this service. The patient expressed understanding and agreed to proceed. I discussed the treatment planning with the patient. The patient was provided an opportunity to ask questions and all were answered. The patient agreed with the plan and demonstrated an understanding of the instructions. The patient was advised to call  our office if  symptoms worsen or feel they are in a crisis state and need immediate contact.   Therapist Location: office Patient Location: home    Reported Symptoms: anxiety, depression, hate (decreased some), sadness, frustration     Mental Status Exam:  Appearance:   Casual     Behavior:  Appropriate, Sharing, and Motivated  Motor:  Impacted by physical issues  Speech/Language:   Clear and Coherent  Affect:  Depressed and anxious  Mood:  anxious and depressed  Thought process:  goal directed  Thought content:    WNL  Sensory/Perceptual disturbances:    WNL  Orientation:  oriented to person, place, time/date, situation, day of week, month of year, year, and stated date of April 22, 2024  Attention:  Good  Concentration:  Fair  Memory:  Some memory issues and her physical health is a factor in this  Fund of knowledge:   Good and Fair  Insight:    no  Judgment:   Fair  Impulse Control:  Fair   Risk  Assessment: Danger to Self:  No Self-injurious Behavior: No Danger to Others: No Duty to Warn:no Physical Aggression / Violence:No  Access to Firearms a concern: No  Gang Involvement:No   Subjective:   Patient working further in session today primarily on her symptoms of anxiety, envy, heat, depression, and some frustrations with herself and others.  She recently added envy as a new symptom talking about it in the most recent session and again today. Envy of others, assuming they have no problems. I had that stupid test this morning trying to find out something about me throwing things up. Not sure if they think I need an ng tube or what.  Encouraging patient to talk more as she is obviously angry, sarcastic, but I think hurting more deeply. Did well in talking through her concerns and frustrations that are feeding some of her anger and frustration. Denies SI.  Difficult circumstances with her cancer and some hurt within family.  Patient seems to appreciate the time today for her to process these difficult circumstances in our session together.  Continue to encourage her not to isolate herself and be able to have time with those with whom she desires to have time and who want to have time with her. Encouraged patient not to isolate and to keep working on her envy and anger, some of which she did in session today. Less anger by end of session. Some less anxiety/depression. Less self-defeatedness.     Interventions: Cognitive Behavioral Therapy, Solution-Oriented/Positive Psychology, and Ego-Supportive Long-term goal:  Patient will confront  her anxiety, fears, anger reported re: her illness and use time in therapy and beyond to work on this, while receiving feedback and support from therapist. Help her determine what she feels is realistic for her considering her health challenges and difficult prognosis.  Short-term goal: Participate in therapy and initiate communication of needs and desires at  this point in her struggle physically.  Strategies: Share and talk through anxiety, anger, and depression that are experienced and work to find and use helpful coping tools to better manage those feelings.    Diagnosis:   ICD-10-CM   1. Major depressive disorder, recurrent episode, moderate (HCC)  F33.1      Plan:   Patient showing good participation in session today as she continues working on her symptoms of anger, frustration,envy, hate, anxiety, and depression.  Seems to really appreciate the sessions and response to interventions although she does have significant health challenges.  Encouraged her venting rather than trying to hold things inside and she responds favorably.  Continues and confronting the challenges of her physical health and able to experience some stressful and some more calm interactions within her family.  (Not all details included in this note due to patient privacy needs).  Goal review and progress/challenges noted with patient.  Next appointment within 2 weeks.   Barnie Bunde, LCSW

## 2024-04-23 ENCOUNTER — Other Ambulatory Visit: Payer: Self-pay | Admitting: Adult Health

## 2024-04-23 ENCOUNTER — Encounter: Payer: Self-pay | Admitting: Hematology and Oncology

## 2024-04-23 ENCOUNTER — Other Ambulatory Visit (HOSPITAL_COMMUNITY): Payer: Self-pay

## 2024-04-27 ENCOUNTER — Ambulatory Visit (INDEPENDENT_AMBULATORY_CARE_PROVIDER_SITE_OTHER): Admitting: Psychiatry

## 2024-04-27 DIAGNOSIS — F331 Major depressive disorder, recurrent, moderate: Secondary | ICD-10-CM | POA: Diagnosis not present

## 2024-04-27 NOTE — Progress Notes (Signed)
 [facility] Counselor/Therapist Progress Note  Patient ID: [name], MRN: [id number],    Date: 04/27/2024  Time Spent: 50 minutes   Treatment Type: Individual Therapy  Virtual Visit via Telehealth Note: MyChart Video session Connected with patient by a telemedicine/telehealth application, with their informed consent, and verified patient privacy and that I am speaking with the correct person using two identifiers. I discussed the limitations, risks, security and privacy concerns of performing psychotherapy and the availability of in person appointments. I also discussed with the patient that there may be a patient responsible charge related to this service. The patient expressed understanding and agreed to proceed. I discussed the treatment planning with the patient. The patient was provided an opportunity to ask questions and all were answered. The patient agreed with the plan and demonstrated an understanding of the instructions. The patient was advised to call  our office if  symptoms worsen or feel they are in a crisis state and need immediate contact.   Therapist Location: office Patient Location: home   Reported Symptoms: anxiety, depression, hate (decreased some), sadness, frustration extreme   Mental Status Exam:  Appearance:   Casual     Behavior:  Sharing and Motivated  Motor:  Impacted due to her physical issues  Speech/Language:   Clear and Coherent  Affect:  Depressed and anxious, irritability  Mood:  anxious and depressed  Thought process:  goal directed  Thought content:    Rumination  Sensory/Perceptual disturbances:    WNL  Orientation:  oriented to sometimes forgets the day and date  Attention:  Good  Concentration:  Good and Fair  Memory:  Some memory issues   Fund of knowledge:   Good and Fair  Insight:    Good and Fair  Judgment:   Good  Impulse Control:  Good   Risk Assessment: Danger to Self:  No Self-injurious Behavior: No Danger to  Others: No Duty to Warn:no Physical Aggression / Violence:No  Access to Firearms a concern: No  Gang Involvement:No   Subjective:  Patient today in session focusing on her anxiety, envy, hate, depression, and frustrations with others and herself. Felt good to get her hair fixed this weekend by her hairdresser which helped her mood some. Needed to express and share her emotions today related to her illness and how it has impacted her life and relationships. States she can't vent with some people and appreciates being able to be very open in our MyChartVideo session today. Talking some further today about her symptom of envy, hate, depression, anxiety. Denies any SI. Some sarcasm but feeling heard is important. Encouraged her venting these strong symptoms and she stated her anxiety is some better. Therapist continues to support patient in her venting as her not venting at times is hurtful versus hopeful. Participated very well today in session and seemed to be able to let go of some of the anger and frustration that she discussed today.  She does face difficult circumstances with her cancer and some family issues interpersonally, and seems to appreciate having someone to be able to confidentially share and work through anger and frustration, and efforts to move forward.  Some ongoing family tensions and chaos does cloud her situation and trying to manage her feelings as she does not seem to feel supported by many people.  It does seem at times that she may not interpret the support of others feel they are giving her, as support, and I have encouraged her to  communicate to them what is most helpful to her.  Continue to encourage patient not to isolate and to continue her work as she did today on her anger, envy, and a lot of frustration.  Is planning to go on a trip in the coming week with family and hoping she will be able to do that.   Interventions: Cognitive Behavioral Therapy,  Solution-Oriented/Positive Psychology, and Ego-Supportive Long-term goal:  Patient will confront her anxiety, fears, anger reported re: her illness and use time in therapy and beyond to work on this, while receiving feedback and support from therapist. Help her determine what she feels is realistic for her considering her health challenges and difficult prognosis.  Short-term goal: Participate in therapy and initiate communication of needs and desires at this point in her struggle physically.  Strategies: Share and talk through anxiety, anger, and depression that are experienced and work to find and use helpful coping tools to better manage those feelings.    Diagnosis:   ICD-10-CM   1. Major depressive disorder, recurrent episode, moderate (HCC)  F33.1      Plan:   Patient today participating actively and pressing openly her feelings of frustration,envy, hate, anxiety, anger, and depression.  Denies any thoughts to harm herself.  Does continue to appreciate having her sessions as this gives her a chance to vent and say what ever I need to say.  She does continue to have significant health challenges with her cancer.  I do encourage patient to vent in her sessions and she is very thorough and her venting.  Patient is hopeful of going with family on a trip next week and that will depend on how she is doing physically.  Trying to manage some of her stressors better and choose better ways of communicating within her family.  (Not all details included in this note due to patient privacy needs.)  Goal review and progress/challenges noted with patient.  Next appointment within 2 weeks.   Barnie Bunde, LCSW

## 2024-04-29 ENCOUNTER — Telehealth: Payer: Self-pay | Admitting: Adult Health

## 2024-04-29 ENCOUNTER — Other Ambulatory Visit: Payer: Self-pay | Admitting: Adult Health

## 2024-04-29 DIAGNOSIS — F410 Panic disorder [episodic paroxysmal anxiety] without agoraphobia: Secondary | ICD-10-CM

## 2024-04-29 NOTE — Telephone Encounter (Signed)
 Will be addressed thru pharmacy interface.

## 2024-04-29 NOTE — Telephone Encounter (Signed)
 Pt needs Rf of Clonazepam     Next appt 7/31   [facility] [address]

## 2024-04-30 ENCOUNTER — Telehealth (INDEPENDENT_AMBULATORY_CARE_PROVIDER_SITE_OTHER): Admitting: Adult Health

## 2024-04-30 ENCOUNTER — Encounter: Payer: Self-pay | Admitting: Adult Health

## 2024-04-30 ENCOUNTER — Other Ambulatory Visit: Payer: Self-pay | Admitting: Adult Health

## 2024-04-30 DIAGNOSIS — F410 Panic disorder [episodic paroxysmal anxiety] without agoraphobia: Secondary | ICD-10-CM | POA: Diagnosis not present

## 2024-04-30 DIAGNOSIS — G4700 Insomnia, unspecified: Secondary | ICD-10-CM

## 2024-04-30 DIAGNOSIS — F331 Major depressive disorder, recurrent, moderate: Secondary | ICD-10-CM

## 2024-04-30 DIAGNOSIS — F411 Generalized anxiety disorder: Secondary | ICD-10-CM | POA: Diagnosis not present

## 2024-04-30 MED ORDER — FLUOXETINE HCL 40 MG PO CAPS: 40.0000 mg | ORAL_CAPSULE | Freq: Every day | ORAL | 1 refills | Status: DC

## 2024-04-30 MED ORDER — CLONAZEPAM 1 MG PO TABS: ORAL_TABLET | ORAL | 0 refills | Status: DC

## 2024-04-30 NOTE — Progress Notes (Signed)
 [name] [id number] 1971/08/26 53 y.o.  Virtual Visit via Video Note  I connected with pt @ on 04/30/24 at  9:30 AM EDT by a video enabled telemedicine application and verified that I am speaking with the correct person using two identifiers.   I discussed the limitations of evaluation and management by telemedicine and the availability of in person appointments. The patient expressed understanding and agreed to proceed.  I discussed the assessment and treatment plan with the patient. The patient was provided an opportunity to ask questions and all were answered. The patient agreed with the plan and demonstrated an understanding of the instructions.   The patient was advised to call back or seek an in-person evaluation if the symptoms worsen or if the condition fails to improve as anticipated.  I provided 30 minutes of non-face-to-face time during this encounter.  The patient was located at home.  The provider was located at [facility].   Susan LOISE Sayers, NP   Subjective:   Patient ID:  [name] is a 53 y.o. (DOB Dec 12, 1970) female.  Chief Complaint: No chief complaint on file.   HPI [name] presents for follow-up of MDD, GAD, insomnia, and panic attacks.  Describes mood today as about the same. Pleasant. Flat. Tearful. Mood symptoms - reports anxiety and depression. Denies irritability. Reports decreased interest and motivation. Reports panic attacks - 4 massive attacks a week. Reports worry, rumination and over thinking. Reports ongoing health concerns - palliative care. Reports mood is variable. Stating I'm numbing myself through it all day. Taking medications as prescribed and feels they are helpful. Working with [name] for therapy.  Energy levels lower. Active, does not have a regular exercise routine with current physical disabilities. Is unable to enjoy some usual interests and activities. Married. Lives with husband and son. Has a  daughter - 34 in [place]. Spending time with family.  Appetite adequate. Weight gain 162 pounds. Reports sleep is variable. Averages 4 hours. Reports daytime napping. Reports issues with focus and concentration difficulties - it's all over the place. Reports having issues with memory. Completing tasks. Managing some aspects of household. Disabled since 2016. Denies SI or HI.  Denies AH or VH. Denies self harm. Denies substance use.   Previous medication trials: Wellbutrin  - mean, Zoloft-not helpful   Review of Systems:  Review of Systems  Musculoskeletal:  Negative for gait problem.  Neurological:  Negative for tremors.  Psychiatric/Behavioral:         Please refer to HPI    Medications: I have reviewed the patient's current medications.  Current Outpatient Medications  Medication Sig Dispense Refill   albuterol  (PROVENTIL ) (2.5 MG/3ML) 0.083% nebulizer solution INHALE 3 ML BY NEBULIZATION EVERY 6 HOURS AS NEEDED FOR WHEEZING OR SHORTNESS OF BREATH 225 mL 1   albuterol  (VENTOLIN  HFA) 108 (90 Base) MCG/ACT inhaler Inhale 2 puffs into the lungs every 6 (six) hours as needed for wheezing or shortness of breath. 8 g 6   amoxicillin -clavulanate (AUGMENTIN ) 875-125 MG tablet Take 1 tablet by mouth 2 (two) times daily. 14 tablet 0   aspirin  EC 81 MG tablet Take 1 tablet (81 mg total) by mouth daily. Swallow whole. (Patient taking differently: Take 81 mg by mouth at bedtime. Chewable) 90 tablet 3   Bismuth Tribromoph-Petrolatum (XEROFORM OCCLUSIVE GAUZE STRIP) PADS Apply 1 each topically as directed. 50 each 1   bumetanide  (BUMEX ) 1 MG tablet Take 1 tablet (1 mg total) by mouth 2 (two) times  daily. 180 tablet 3   buPROPion  (WELLBUTRIN  XL) 150 MG 24 hr tablet TAKE 1 TABLET BY MOUTH EVERY DAY 30 tablet 0   clonazePAM  (KLONOPIN ) 1 MG tablet TAKE 1 TABLET BY MOUTH 3 TIMES A DAY AS NEEDED AND 1 EXTRA FOR SEVERE ANXIETY UP TO 10 A MONTH. 56 tablet 0   dexlansoprazole  (DEXILANT ) 60 MG capsule  Take 1 capsule (60 mg total) by mouth daily. 90 capsule 5   diclofenac  (VOLTAREN ) 50 MG EC tablet Take 50 mg by mouth 2 (two) times daily. *as needed for migraine, Can repeat if needed*     diphenhydrAMINE  (BENADRYL ) 12.5 MG/5ML elixir Take 10 mLs (25 mg total) by mouth every 6 (six) hours as needed (nausea). 120 mL 0   doxycycline  (VIBRAMYCIN ) 100 MG capsule Take 1 capsule (100 mg total) by mouth 2 (two) times daily. 14 capsule 0   EMGALITY  120 MG/ML SOSY Inject 120 mg into the skin every 28 (twenty-eight) days.     EPINEPHRINE  0.3 mg/0.3 mL IJ SOAJ injection INJECT 0.3 MG INTO THE MUSCLE AS NEEDED FOR ANAPHYLAXIS. 2 each 1   famotidine  (PEPCID ) 40 MG/5ML suspension Take 2.5 mLs (20 mg total) by mouth daily. 75 mL 6   FLORASTOR 250 MG capsule Take 250 mg by mouth 2 (two) times daily. Mid Morning and Mid Afternoon     FLUoxetine  (PROZAC ) 20 MG capsule Take 1 capsule (20 mg total) by mouth daily. 90 capsule 0   fluticasone -salmeterol (ADVAIR HFA) 230-21 MCG/ACT inhaler Inhale 2 puffs into the lungs 2 (two) times daily. 1 each 11   Heparin  Na, Pork, Lock Flsh PF (BD HEPARIN  POSIFLUSH) 100 UNIT/ML SOLN USE 5 MLS IN PORT A CATH ONCE DAILY AFTER MEDICATION ADMINISTRATION AS A HEPLOCK AS DIRECTED. 150 mL 3   hydrocortisone  (CORTEF ) 10 MG tablet Take 1-2 tablets (10-20 mg total) by mouth See admin instructions. Take 20 mg in the am and 10mg  in the evening. Take after completing prednisone  (Patient taking differently: Take 10-20 mg by mouth See admin instructions. Take 20 mg in the am at 10 am and 10 mg in the afternoon. Take after completing prednisone )     hyoscyamine  (LEVSIN /SL) 0.125 MG SL tablet Place 1 tablet (0.125 mg total) under the tongue every 6 (six) hours as needed (esophageal spasm). Up to 1.25 mg daily 30 tablet 1   lamoTRIgine  (LAMICTAL ) 200 MG tablet TAKE 1 TABLET BY MOUTH EVERYDAY AT BEDTIME 30 tablet 2   lamoTRIgine  (LAMICTAL ) 25 MG tablet Take two tablets every morning. 60 tablet 2    Lancets (ONETOUCH DELICA PLUS LANCET33G) MISC 3 (three) times daily. for testing     lidocaine  (XYLOCAINE ) 2 % solution Use as directed 10 mLs in the mouth or throat every 4 (four) hours as needed (for esophageal pain and pain with swallowing.). 100 mL 1   LORazepam  (ATIVAN ) 0.5 MG tablet Take 1 tablet (0.5 mg total) by mouth 3 (three) times daily as needed for anxiety. 30 tablet 0   nitroGLYCERIN  (NITROSTAT ) 0.4 MG SL tablet DISSOLVE 1 TAB UNDER THE TONGUE EVERY 5 MINUTES AS NEEDED FOR CHEST PAIN. MAX OF 3 DOSES, THEN 911. 75 tablet 0   NURTEC 75 MG TBDP Take 1 tablet by mouth daily as needed.     OLANZapine  (ZYPREXA ) 5 MG tablet Take 1 tablet (5 mg total) by mouth at bedtime. 30 tablet 2   ONETOUCH VERIO test strip 3 (three) times daily. for testing     OXYGEN  Inhale 4-5 L/min into  the lungs continuous.     potassium chloride  (KLOR-CON ) 10 MEQ tablet as needed.     PROAIR  HFA 108 (90 Base) MCG/ACT inhaler Inhale 2 puffs into the lungs every 4 (four) hours as needed for wheezing or shortness of breath.     QVAR  REDIHALER 80 MCG/ACT inhaler Inhale 1 puff into the lungs 2 (two) times daily.     rosuvastatin  (CRESTOR ) 10 MG tablet Take 10 mg by mouth in the morning. Mid morning     sacubitril -valsartan  (ENTRESTO ) 24-26 MG TAKE 1 TABLET BY MOUTH TWICE A DAY (Patient taking differently: Take 1 tablet by mouth daily.) 60 tablet 5   sodium chloride  flush (BD POSIFLUSH) 0.9 % SOLN injection Use as directed for catheter maintenance 600 mL 3   Sodium Chloride  Flush (NORMAL SALINE FLUSH) 0.9 % SOLN Use as directed for central venous catheter maintenance 600 mL 3   Sodium Chloride  Flush (NORMAL SALINE FLUSH) 0.9 % SOLN Use as directed for catheter maintenance 600 mL 3   Sodium Chloride  Flush (NORMAL SALINE FLUSH) 0.9 % SOLN Use 10 mLs as directed for catheter maintenance. 600 mL 3   Sodium Chloride  Flush (NORMAL SALINE FLUSH) 0.9 % SOLN Use as directed for catheter maintenance. 600 mL 3   sucralfate   (CARAFATE ) 1 GM/10ML suspension Take 1 g by mouth daily as needed (as directed for ulcers).     SYNTHROID  100 MCG tablet Take 1 tablet (100 mcg total) by mouth every morning. (Patient taking differently: Take 100 mcg by mouth every morning. * Must be name brand*)     Tiotropium Bromide  Monohydrate (SPIRIVA  RESPIMAT) 1.25 MCG/ACT AERS Inhale 2 puffs into the lungs daily. 4 g 11   topiramate  (TOPAMAX ) 50 MG tablet Take 150 mg by mouth at bedtime.     zolpidem  (AMBIEN  CR) 12.5 MG CR tablet TAKE ONE TABLET BY MOUTH AT BEDTIME AS NEEDED FOR SLEEP. 90 tablet 0   No current facility-administered medications for this visit.    Medication Side Effects: None  Allergies:  Allergies  Allergen Reactions   Ferrous Bisglycinate Chelate [Iron] Anaphylaxis and Other (See Comments)    Only IV - only FERRLICET   Mushroom Extract Complex (Obsolete) Anaphylaxis   Na Ferric Gluc Cplx In Sucrose Anaphylaxis   Cymbalta [Duloxetine Hcl] Swelling and Anxiety   Hydromorphone  Other (See Comments)    BP drop and heart rate drops 5.1.20 PT REPORTS THAT SHE TAKES DILAUDID  AT HOME   Ondansetron  Hcl Other (See Comments)   Promethazine Other (See Comments)    Other reaction(s): Unknown   Succinylcholine Other (See Comments)    Lock Jaw   Buprenorphine Hcl Hives   Compazine Other (See Comments)    Altered mental status Aggression   Duloxetine     Other reaction(s): Not available   Metoclopramide Other (See Comments)    Dystonia   Morphine And Codeine Hives   Ondansetron  Hives and Rash    Other reaction(s): Unknown Other reaction(s): Unknown   Promethazine Hcl Hives   Tegaderm Ag Mesh [Silver ] Rash    Old formulation only, is able to tolerate new formulation    Past Medical History:  Diagnosis Date   Addison's disease (HCC)    Adrenal insufficiency (HCC)    Anemia    Anxiety    Aortic stenosis    Aortic stenosis    Appendicitis 12/19/2009   Appendicitis    Breast cancer (HCC)    STATUS POST  BILATERAL MASTECTOMY. STATUS POST RECONSTRUCTION. SHE HAD SILICONE BREAST IMPLANTS  AND THE LEFT IMPLANT IS LEAKING SLIGHTLY   Cellulitis of right middle finger 11/07/2018   Cervical cancer (HCC) 12/23/2018   Chest pain    CHF with right heart failure (HCC) 04/17/2017   Chronic respiratory failure with hypoxia (HCC) 12/23/2018   CKD (chronic kidney disease) stage 3, GFR 30-59 ml/min (HCC)    Cough variant asthma 04/13/2019   Depression    Functional neurological symptom disorder with mixed symptoms 2023   GERD (gastroesophageal reflux disease)    takes Dexilant  and carafate  and gi coctail    Headache    migraines on a daily and monthly regimen    Heart murmur    History of kidney stones    Hodgkin lymphoma (HCC)    STATUS POST MANTLE RADIATION   Hodgkin's lymphoma (HCC)    1987   Hypertension    Hypoxia    Multiple lung nodules    bilateral   Necrotizing fasciitis (HCC) 12/23/2018   Non-ischemic cardiomyopathy (HCC)    Osteoporosis    Ovarian tumor (benign)    bilateral   Palpitations    Pituitary adenoma (HCC) 12/23/2018   Pneumonia    PONV (postoperative nausea and vomiting)    Pre-diabetes    per pt; no meds   Pulmonary hypertension (HCC) 12/23/2018   Raynaud phenomenon    Right heart failure (HCC) 04/17/2017   Seizures (HCC)    last febrile seizure was approx 3 weeks ago per report on 12/01/2020   Supplemental oxygen  dependent    3 liters   SVT (supraventricular tachycardia) (HCC)    Tachycardia    Thyroid  cancer (HCC)    STATUS POST SURGICAL REMOVAL-CURRENT ON THYROID  REPLACEMENT    Family History  Adopted: Yes  Family history unknown: Yes    Social History   Socioeconomic History   Marital status: Married    Spouse name: Not on file   Number of children: Not on file   Years of education: Not on file   Highest education level: Not on file  Occupational History   Not on file  Tobacco Use   Smoking status: Never   Smokeless tobacco: Never  Vaping Use    Vaping status: Never Used  Substance and Sexual Activity   Alcohol use: Not Currently    Comment: social    Drug use: No   Sexual activity: Not Currently    Birth control/protection: Surgical  Other Topics Concern   Not on file  Social History Narrative   Not on file   Social Drivers of Health   Financial Resource Strain: Medium Risk (08/21/2017)   [facility]   Overall Financial Resource Strain (CARDIA)    Difficulty of Paying Living Expenses: Somewhat hard  Food Insecurity: No Food Insecurity (04/23/2023)   Hunger Vital Sign    Worried About Running Out of Food in the Last Year: Never true    Ran Out of Food in the Last Year: Never true  Transportation Needs: No Transportation Needs (04/23/2023)   PRAPARE - Administrator, Civil Service (Medical): No    Lack of Transportation (Non-Medical): No  Physical Activity: Unknown (08/21/2017)   Received from [facility]   Exercise Vital Sign    Days of Exercise per Week: 0 days    Minutes of Exercise per Session: Not on file  Stress: Stress Concern Present (08/21/2017)   Received from [facility] - Occupational Stress  Questionnaire    Feeling of Stress : Very much  Social Connections: Unknown (02/12/2022)   Received from [facility]   Social Network    Social Network: Not on file  Intimate Partner Violence: Not At Risk (04/23/2023)   Humiliation, Afraid, Rape, and Kick questionnaire    Fear of Current or Ex-Partner: No    Emotionally Abused: No    Physically Abused: No    Sexually Abused: No    Past Medical History, Surgical history, Social history, and Family history were reviewed and updated as appropriate.   Please see review of systems for further details on the patient's review from today.   Objective:   Physical Exam:  LMP  (LMP Unknown)   Physical Exam  Lab Review:     Component Value  Date/Time   NA 141 04/08/2024 1048   NA 140 08/20/2022 1513   K 3.2 (L) 04/08/2024 1048   CL 102 04/08/2024 1048   CO2 29 04/08/2024 1048   GLUCOSE 116 (H) 04/08/2024 1048   BUN 27 (H) 04/08/2024 1048   BUN 28 (H) 08/20/2022 1513   CREATININE 1.46 (H) 04/08/2024 1048   CREATININE 1.08 (H) 02/09/2022 1202   CALCIUM  9.8 04/08/2024 1048   PROT 7.5 03/25/2024 2029   PROT 7.1 01/23/2018 1225   ALBUMIN 3.7 03/25/2024 2029   ALBUMIN 4.4 01/23/2018 1225   AST 28 03/25/2024 2029   AST 26 02/09/2022 1202   ALT 33 03/25/2024 2029   ALT 40 02/09/2022 1202   ALKPHOS 58 03/25/2024 2029   BILITOT 0.4 03/25/2024 2029   BILITOT 0.4 02/09/2022 1202   GFRNONAA 49 (L) 03/25/2024 2029   GFRNONAA >60 02/09/2022 1202   GFRAA >60 04/27/2020 0613   GFRAA >60 02/10/2020 1020       Component Value Date/Time   WBC 9.5 04/08/2024 1048   RBC 4.30 04/08/2024 1048   HGB 9.7 (L) 04/08/2024 1048   HGB 10.6 (L) 08/20/2022 1513   HCT 31.2 (L) 04/08/2024 1048   HCT 34.4 08/20/2022 1513   PLT 504.0 (H) 04/08/2024 1048   PLT 414 08/20/2022 1513   MCV 72.5 (L) 04/08/2024 1048   MCV 78 (L) 08/20/2022 1513   MCH 23.0 (L) 03/25/2024 2110   MCHC 31.3 04/08/2024 1048   RDW 19.8 (H) 04/08/2024 1048   RDW 16.5 (H) 08/20/2022 1513   LYMPHSABS 1.5 04/08/2024 1048   MONOABS 0.5 04/08/2024 1048   EOSABS 0.1 04/08/2024 1048   BASOSABS 0.1 04/08/2024 1048    No results found for: POCLITH, LITHIUM   No results found for: PHENYTOIN, PHENOBARB, VALPROATE, CBMZ   .res Assessment: Plan:    Plan:  PDMP reviewed  D/C Xanax  1mg  daily   Continue Clonazepam  1mg  - 3 times daily and 1 extra as needed for severe anxiety  Increase Prozac  20mg  to 40mg  daily  Wellbutrin  XL150mg  every morning - did not tolerate increase in dose - reports seizure history  Lamictal  200mg  daily Lamictal  50mg  every morning  Olanzapine  5mg  at hs   Seeing [name] therapy visits.  RTC 4 weeks  30 minutes spent  dedicated to the care of this patient on the date of this encounter to include pre-visit review of records, ordering of medication, post visit documentation, and face-to-face time with the patient discussing MDD, GAD, insomnia, and panic attacks. Discussed continuing current medication regimen.  Discussed potential benefits, risk, and side effects of benzodiazepines to include potential risk of tolerance and dependence, as well as possible drowsiness.  Advised  patient not to drive if experiencing drowsiness and to take lowest possible effective dose to minimize risk of dependence and tolerance.   Counseled patient regarding potential benefits, risks, and side effects of Lamictal  to include potential risk of Stevens-Johnson syndrome. Advised patient to stop taking Lamictal  and contact office immediately if rash develops and to seek urgent medical attention if rash is severe and/or spreading quickly.  Discussed potential metabolic side effects associated with atypical antipsychotics, as well as potential risk for movement side effects. Advised pt to contact office if movement side effects occur.    Patient advised to contact office with any questions, adverse effects, or acute worsening in signs and symptoms.  There are no diagnoses linked to this encounter.   Please see After Visit Summary for patient specific instructions.  Future Appointments  Date Time Provider Department Center  04/30/2024  9:30 AM Parissa Chiao, Susan Mattocks, NP [facility] None  05/05/2024  8:00 AM Sherlynn Sober, LCSW [facility] None  05/15/2024  9:00 AM Sherlynn Sober, LCSW [facility] None  05/19/2024  2:40 PM Cirigliano, Vito V, DO [facility]    No orders of the defined types were placed in this encounter.     -------------------------------

## 2024-05-03 DIAGNOSIS — R911 Solitary pulmonary nodule: Secondary | ICD-10-CM | POA: Diagnosis not present

## 2024-05-03 DIAGNOSIS — G8222 Paraplegia, incomplete: Secondary | ICD-10-CM | POA: Diagnosis not present

## 2024-05-05 ENCOUNTER — Other Ambulatory Visit (HOSPITAL_COMMUNITY): Payer: Self-pay

## 2024-05-05 ENCOUNTER — Ambulatory Visit (INDEPENDENT_AMBULATORY_CARE_PROVIDER_SITE_OTHER): Admitting: Psychiatry

## 2024-05-05 ENCOUNTER — Encounter: Payer: Self-pay | Admitting: Hematology and Oncology

## 2024-05-05 DIAGNOSIS — F331 Major depressive disorder, recurrent, moderate: Secondary | ICD-10-CM | POA: Diagnosis not present

## 2024-05-05 NOTE — Progress Notes (Signed)
 [facility] Counselor/Therapist Progress Note  Patient ID: [name], MRN: [id number],    Date: 05/05/2024  Time Spent: 48 minutes   Treatment Type: Individual Therapy  Virtual Visit via Telehealth Note: MyChartVideo session Connected with patient by a telemedicine/telehealth application, with their informed consent, and verified patient privacy and that I am speaking with the correct person using two identifiers. I discussed the limitations, risks, security and privacy concerns of performing psychotherapy and the availability of in person appointments. I also discussed with the patient that there may be a patient responsible charge related to this service. The patient expressed understanding and agreed to proceed. I discussed the treatment planning with the patient. The patient was provided an opportunity to ask questions and all were answered. The patient agreed with the plan and demonstrated an understanding of the instructions. The patient was advised to call  our office if  symptoms worsen or feel they are in a crisis state and need immediate contact.   Therapist Location: office Patient Location: home   Reported Symptoms: depression, irrational fear, anxious, some anger    Mental Status Exam:  Appearance:   Casual     Behavior:  Appropriate, Sharing, and Motivated  Motor:  Limited some by her cancer  Speech/Language:   Clear and Coherent  Affect:  Flat  Mood:  depressed  Thought process:  goal directed  Thought content:    Some rumination but not much  Sensory/Perceptual disturbances:    WNL  Orientation:  oriented to person, place, time/date, situation, day of week, month of year, year, and stated date of Aug. 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  Just returned from a week with family to the beach, but had mixed feelings about it, due to interpersonal feelings and stressor within family. Some anger re: herself, but mostly her sister, which patient processed in session today as it easily triggers her anger. Anger and resentment especially with sister and working further in detail today on this. Trying to help patient to communicate differently in working on her anger/resentment and this is difficult for patient as it involves letting to and being able to express herself more openly. Shares husband and she are celebrating their anniversary with a trip to [place] this coming week. Encouraged patient's venting of negative feelings as she may works to let go more of envy,hate, and decrease her anxiety.  Does like to travel and fortunately is still able despite her physical condition to do so.  Patient reports needing sessions to be able to vent her feelings mostly anxiety, anger, frustration, and depression, and does feel it is helpful to get her.  She continues to try and best manage her cancer as well as some family issues, adding that it helps to be able to talk with someone confidentially as we talk in order to work through anger and frustration while managing her cancer.  Ongoing  family tensions and chaos at times does make the situation more challenging however the support of other people is helpful.  Have encouraged patient to reach out to those that she knows supports her and let them know what feels most helpful to her in terms of support.  Interventions: Cognitive Behavioral Therapy, Solution-Oriented/Positive Psychology, and  Ego-Supportive  Long-term goal:  Patient will confront her anxiety, fears, anger reported re: her illness and use time in therapy and beyond to work on this, while receiving feedback and support from therapist. Help her determine what she feels is realistic for her considering her health challenges and difficult prognosis.  Short-term goal: Participate in therapy and initiate communication of needs and desires at this point in her struggle physically.  Strategies: Share and talk through anxiety, anger, and depression that are experienced and work to find and use helpful coping tools to better manage those feelings.   Diagnosis:   ICD-10-CM   1. Major depressive disorder, recurrent episode, moderate (HCC)  F33.1      Plan: Patient openly expressing her thoughts and feelings of anxiety, anger, hate and envy decreased, and frustration.  Continues to deny any thoughts of self-harm.  Is a cancer patient but not reportedly under the care of hospice at this point and is able to be amongst family and travel a good bit which she feels is emotionally healthy for her.  Appreciates her sessions with therapist as this gives her a chance to talk more openly and confidentially.  Actively participated in session today and continue to encourage her work on better communication within the family and the management of her own stress is much as possible.  (Not all details shared in this treatment note so as to honor patient's privacy needs.)  Goal review and progress/challenges noted with patient.  Next appointment within the weeks.   Barnie Bunde, LCSW

## 2024-05-06 ENCOUNTER — Encounter (HOSPITAL_COMMUNITY)

## 2024-05-06 ENCOUNTER — Other Ambulatory Visit (HOSPITAL_COMMUNITY): Payer: Self-pay

## 2024-05-13 ENCOUNTER — Ambulatory Visit: Admitting: Gastroenterology

## 2024-05-15 ENCOUNTER — Ambulatory Visit (INDEPENDENT_AMBULATORY_CARE_PROVIDER_SITE_OTHER): Admitting: Psychiatry

## 2024-05-15 ENCOUNTER — Other Ambulatory Visit: Payer: Self-pay

## 2024-05-15 ENCOUNTER — Other Ambulatory Visit (HOSPITAL_COMMUNITY): Payer: Self-pay

## 2024-05-15 DIAGNOSIS — F331 Major depressive disorder, recurrent, moderate: Secondary | ICD-10-CM

## 2024-05-15 NOTE — Progress Notes (Signed)
 [facility] Counselor/Therapist Progress Note  Patient ID: [name], MRN: [id number],    Date: 05/15/2024  Time Spent: 53 minutes   Treatment Type: Individual Therapy  Virtual Visit via Telehealth Note: MyChartVideo session Connected with patient by a telemedicine/telehealth application, with their informed consent, and verified patient privacy and that I am speaking with the correct person using two identifiers. I discussed the limitations, risks, security and privacy concerns of performing psychotherapy and the availability of in person appointments. I also discussed with the patient that there may be a patient responsible charge related to this service. The patient expressed understanding and agreed to proceed. I discussed the treatment planning with the patient. The patient was provided an opportunity to ask questions and all were answered. The patient agreed with the plan and demonstrated an understanding of the instructions. The patient was advised to call  our office if  symptoms worsen or feel they are in a crisis state and need immediate contact.   Therapist Location: office Patient Location: home  Reported Symptoms: depression, anxious, some anger, irrational fear mostly related to son    Mental Status Exam:  Appearance:   Casual     Behavior:  Appropriate and Sharing  Motor:  Affected by her physical health  Speech/Language:   Clear and Coherent  Affect:  Depressed and anxious  Mood:  anxious and depressed  Thought process:  goal directed  Thought content:    Rumination  Sensory/Perceptual disturbances:    WNL  Orientation:  oriented to person, place, time/date, situation, day of week, month of year, year, and stated date of Aug. 15, 2025  Attention:  Good  Concentration:  Good and Fair  Memory:  Impacted some by her physical issues and meds per patient  Fund of knowledge:   Good  Insight:    Good and Fair  Judgment:   Good and Fair  Impulse Control:   Good   Risk Assessment: Danger to Self:  No Self-injurious Behavior: No Danger to Others: No Duty to Warn:no Physical Aggression / Violence:No  Access to Firearms a concern: No  Gang Involvement:No   Subjective:  Patient today showing good involvement in her MyChart video session as she works on her anxiety, depression, frustration, irrational fear related to her son and worries something could happen to him, and anger.  Processed her fears/worries about her teenage son, but later noting that son is the type of teenager that is very careful and don't need to [facility] about son. Reports envy and hate have decreased some further. Was able to attend a recent concert with her son and enjoyed it. Some tearfulness off and on but did well in staying engaged in our appt time together. Trying not to jump to far ahead, and stay more in the present especially when with family and son. States she is not involved currently with Hospice nor Palliative Care as she is managing things more on her own due some problems she felt when working with them earlier. Anger not as strong more recently. Expressing self very openly today. Finds it helpful to vent her frustrations, negativity, and anger and work to let go of it.  Stated that depression is currently her main symptom and it often goes back and forth between depression and anxiety.  Denies thoughts to harm self or others.   Interventions: Cognitive Behavioral Therapy, Solution-Oriented/Positive Psychology, and Ego-Supportive Long-term goal:  Patient will confront her anxiety, fears, anger reported re: her illness and use  time in therapy and beyond to work on this, while receiving feedback and support from therapist. Help her determine what she feels is realistic for her considering her health challenges and difficult prognosis.  Short-term goal: Participate in therapy and initiate communication of needs and desires at this point in her struggle  physically.  Strategies: Share and talk through anxiety, anger, and depression that are experienced and work to find and use helpful coping tools to better manage those feelings.   Diagnosis:   ICD-10-CM   1. Major depressive disorder, recurrent episode, moderate (HCC)  F33.1      Plan:  Patient today actively talking and working through personal and family feelings and in some cases assumptions which we challenged in session today.  Patient handled the challenging well and was able to realize that some of her thoughts could be a little exaggerated but also could be assumptions versus realities.  Worked with a couple different examples of this to help patient better understand,  which seemed helpful.  Was very open today with a lot of angry, anxious, and depressive thoughts and feelings but seem to benefit from being able to freely express her feelings without anybody interrupting her, which helped her to become more calm, rational, and understanding of how her thinking goes in negative ways at times and also how she can work to intervene and help interrupt her own thoughts when she is alone and having such thoughts or no one around to really help her in that respect.  Seemed to empower patient some which was a positive for her.  She is a cancer patient but states she is no longer under the care of hospice or palliative care at this point, as she has had some negative issues with them from time to time.  Therapist just let her vent and did not really add anything to that part of the discussion.  Patient does seem to appreciate being able to have the sessions from her home as a MyChart video session and allows her good opportunities to vent and say what ever she needs to say as she tries to negotiate this time in her life with a lot of ups and downs and uncertainties.  (Not all details shared in this treatment note so as to honor patient's privacy needs and some issues.)  Goal Review and  progress/challenges noted with patient.  Next appointment within 3 weeks.   Barnie Bunde, LCSW

## 2024-05-18 ENCOUNTER — Inpatient Hospital Stay (HOSPITAL_COMMUNITY)
Admission: EM | Admit: 2024-05-18 | Discharge: 2024-05-22 | DRG: 871 | Disposition: A | Discharge status: Home Health | Attending: Student | Admitting: Student

## 2024-05-18 ENCOUNTER — Emergency Department (HOSPITAL_COMMUNITY)

## 2024-05-18 ENCOUNTER — Other Ambulatory Visit: Payer: Self-pay

## 2024-05-18 ENCOUNTER — Other Ambulatory Visit (HOSPITAL_COMMUNITY): Payer: Self-pay

## 2024-05-18 ENCOUNTER — Encounter (HOSPITAL_COMMUNITY): Payer: Self-pay

## 2024-05-18 ENCOUNTER — Ambulatory Visit: Payer: Self-pay

## 2024-05-18 DIAGNOSIS — J984 Other disorders of lung: Secondary | ICD-10-CM | POA: Diagnosis not present

## 2024-05-18 DIAGNOSIS — F419 Anxiety disorder, unspecified: Secondary | ICD-10-CM | POA: Diagnosis not present

## 2024-05-18 DIAGNOSIS — Z7951 Long term (current) use of inhaled steroids: Secondary | ICD-10-CM

## 2024-05-18 DIAGNOSIS — Z9981 Dependence on supplemental oxygen: Secondary | ICD-10-CM | POA: Diagnosis not present

## 2024-05-18 DIAGNOSIS — Z87442 Personal history of urinary calculi: Secondary | ICD-10-CM

## 2024-05-18 DIAGNOSIS — Z923 Personal history of irradiation: Secondary | ICD-10-CM

## 2024-05-18 DIAGNOSIS — D6489 Other specified anemias: Secondary | ICD-10-CM | POA: Diagnosis present

## 2024-05-18 DIAGNOSIS — R0602 Shortness of breath: Secondary | ICD-10-CM | POA: Diagnosis not present

## 2024-05-18 DIAGNOSIS — E876 Hypokalemia: Secondary | ICD-10-CM | POA: Diagnosis present

## 2024-05-18 DIAGNOSIS — K219 Gastro-esophageal reflux disease without esophagitis: Secondary | ICD-10-CM | POA: Diagnosis present

## 2024-05-18 DIAGNOSIS — A419 Sepsis, unspecified organism: Secondary | ICD-10-CM | POA: Diagnosis not present

## 2024-05-18 DIAGNOSIS — I130 Hypertensive heart and chronic kidney disease with heart failure and stage 1 through stage 4 chronic kidney disease, or unspecified chronic kidney disease: Secondary | ICD-10-CM | POA: Diagnosis not present

## 2024-05-18 DIAGNOSIS — Z9882 Breast implant status: Secondary | ICD-10-CM

## 2024-05-18 DIAGNOSIS — Z853 Personal history of malignant neoplasm of breast: Secondary | ICD-10-CM

## 2024-05-18 DIAGNOSIS — I428 Other cardiomyopathies: Secondary | ICD-10-CM | POA: Diagnosis present

## 2024-05-18 DIAGNOSIS — Z85850 Personal history of malignant neoplasm of thyroid: Secondary | ICD-10-CM

## 2024-05-18 DIAGNOSIS — J189 Pneumonia, unspecified organism: Secondary | ICD-10-CM | POA: Diagnosis not present

## 2024-05-18 DIAGNOSIS — Z79890 Hormone replacement therapy (postmenopausal): Secondary | ICD-10-CM

## 2024-05-18 DIAGNOSIS — Z7982 Long term (current) use of aspirin: Secondary | ICD-10-CM

## 2024-05-18 DIAGNOSIS — J4550 Severe persistent asthma, uncomplicated: Secondary | ICD-10-CM | POA: Diagnosis not present

## 2024-05-18 DIAGNOSIS — B9710 Unspecified enterovirus as the cause of diseases classified elsewhere: Secondary | ICD-10-CM | POA: Diagnosis present

## 2024-05-18 DIAGNOSIS — Z9013 Acquired absence of bilateral breasts and nipples: Secondary | ICD-10-CM

## 2024-05-18 DIAGNOSIS — J180 Bronchopneumonia, unspecified organism: Secondary | ICD-10-CM | POA: Diagnosis present

## 2024-05-18 DIAGNOSIS — Z885 Allergy status to narcotic agent status: Secondary | ICD-10-CM

## 2024-05-18 DIAGNOSIS — F411 Generalized anxiety disorder: Secondary | ICD-10-CM | POA: Diagnosis present

## 2024-05-18 DIAGNOSIS — Z79620 Long term (current) use of immunosuppressive biologic: Secondary | ICD-10-CM

## 2024-05-18 DIAGNOSIS — I5189 Other ill-defined heart diseases: Secondary | ICD-10-CM | POA: Diagnosis not present

## 2024-05-18 DIAGNOSIS — I5030 Unspecified diastolic (congestive) heart failure: Secondary | ICD-10-CM | POA: Diagnosis present

## 2024-05-18 DIAGNOSIS — I7300 Raynaud's syndrome without gangrene: Secondary | ICD-10-CM | POA: Diagnosis present

## 2024-05-18 DIAGNOSIS — J9621 Acute and chronic respiratory failure with hypoxia: Secondary | ICD-10-CM | POA: Diagnosis not present

## 2024-05-18 DIAGNOSIS — I2721 Secondary pulmonary arterial hypertension: Secondary | ICD-10-CM | POA: Diagnosis not present

## 2024-05-18 DIAGNOSIS — R6520 Severe sepsis without septic shock: Secondary | ICD-10-CM | POA: Diagnosis not present

## 2024-05-18 DIAGNOSIS — Z9181 History of falling: Secondary | ICD-10-CM

## 2024-05-18 DIAGNOSIS — N182 Chronic kidney disease, stage 2 (mild): Secondary | ICD-10-CM | POA: Diagnosis present

## 2024-05-18 DIAGNOSIS — Z452 Encounter for adjustment and management of vascular access device: Secondary | ICD-10-CM | POA: Diagnosis not present

## 2024-05-18 DIAGNOSIS — E778 Other disorders of glycoprotein metabolism: Secondary | ICD-10-CM | POA: Diagnosis present

## 2024-05-18 DIAGNOSIS — B9789 Other viral agents as the cause of diseases classified elsewhere: Secondary | ICD-10-CM | POA: Diagnosis present

## 2024-05-18 DIAGNOSIS — Z9221 Personal history of antineoplastic chemotherapy: Secondary | ICD-10-CM

## 2024-05-18 DIAGNOSIS — F32A Depression, unspecified: Secondary | ICD-10-CM | POA: Diagnosis present

## 2024-05-18 DIAGNOSIS — Z1152 Encounter for screening for COVID-19: Secondary | ICD-10-CM | POA: Diagnosis not present

## 2024-05-18 DIAGNOSIS — E271 Primary adrenocortical insufficiency: Secondary | ICD-10-CM | POA: Diagnosis not present

## 2024-05-18 DIAGNOSIS — Z79899 Other long term (current) drug therapy: Secondary | ICD-10-CM

## 2024-05-18 DIAGNOSIS — Z9109 Other allergy status, other than to drugs and biological substances: Secondary | ICD-10-CM

## 2024-05-18 DIAGNOSIS — Z888 Allergy status to other drugs, medicaments and biological substances status: Secondary | ICD-10-CM

## 2024-05-18 DIAGNOSIS — D509 Iron deficiency anemia, unspecified: Secondary | ICD-10-CM | POA: Diagnosis present

## 2024-05-18 DIAGNOSIS — R918 Other nonspecific abnormal finding of lung field: Secondary | ICD-10-CM | POA: Diagnosis not present

## 2024-05-18 DIAGNOSIS — N1831 Chronic kidney disease, stage 3a: Secondary | ICD-10-CM | POA: Diagnosis present

## 2024-05-18 DIAGNOSIS — M810 Age-related osteoporosis without current pathological fracture: Secondary | ICD-10-CM | POA: Diagnosis present

## 2024-05-18 DIAGNOSIS — R059 Cough, unspecified: Secondary | ICD-10-CM | POA: Diagnosis not present

## 2024-05-18 DIAGNOSIS — E890 Postprocedural hypothyroidism: Secondary | ICD-10-CM | POA: Diagnosis not present

## 2024-05-18 DIAGNOSIS — Z87892 Personal history of anaphylaxis: Secondary | ICD-10-CM

## 2024-05-18 DIAGNOSIS — Z8541 Personal history of malignant neoplasm of cervix uteri: Secondary | ICD-10-CM

## 2024-05-18 DIAGNOSIS — J440 Chronic obstructive pulmonary disease with acute lower respiratory infection: Secondary | ICD-10-CM | POA: Diagnosis not present

## 2024-05-18 DIAGNOSIS — R590 Localized enlarged lymph nodes: Secondary | ICD-10-CM | POA: Diagnosis not present

## 2024-05-18 DIAGNOSIS — R079 Chest pain, unspecified: Secondary | ICD-10-CM | POA: Diagnosis not present

## 2024-05-18 DIAGNOSIS — Z90710 Acquired absence of both cervix and uterus: Secondary | ICD-10-CM

## 2024-05-18 DIAGNOSIS — Z8571 Personal history of Hodgkin lymphoma: Secondary | ICD-10-CM

## 2024-05-18 LAB — CBC WITH DIFFERENTIAL/PLATELET
Abs Granulocyte: 14 K/uL — ABNORMAL HIGH (ref 1.5–6.5)
Abs Immature Granulocytes: 0.09 K/uL — ABNORMAL HIGH (ref 0.00–0.07)
Basophils Absolute: 0.1 K/uL (ref 0.0–0.1)
Basophils Relative: 1 %
Eosinophils Absolute: 0.2 K/uL (ref 0.0–0.5)
Eosinophils Relative: 1 %
HCT: 27.2 % — ABNORMAL LOW (ref 36.0–46.0)
Hemoglobin: 7.9 g/dL — ABNORMAL LOW (ref 12.0–15.0)
Immature Granulocytes: 1 %
Lymphocytes Relative: 8 %
Lymphs Abs: 1.3 K/uL (ref 0.7–4.0)
MCH: 21.7 pg — ABNORMAL LOW (ref 26.0–34.0)
MCHC: 29 g/dL — ABNORMAL LOW (ref 30.0–36.0)
MCV: 74.7 fL — ABNORMAL LOW (ref 80.0–100.0)
Monocytes Absolute: 1 K/uL (ref 0.1–1.0)
Monocytes Relative: 6 %
Neutro Abs: 14 K/uL — ABNORMAL HIGH (ref 1.7–7.7)
Neutrophils Relative %: 83 %
Platelets: 365 K/uL (ref 150–400)
RBC: 3.64 MIL/uL — ABNORMAL LOW (ref 3.87–5.11)
RDW: 17.8 % — ABNORMAL HIGH (ref 11.5–15.5)
Smear Review: NORMAL
WBC: 16.7 K/uL — ABNORMAL HIGH (ref 4.0–10.5)
nRBC: 0 % (ref 0.0–0.2)

## 2024-05-18 LAB — COMPREHENSIVE METABOLIC PANEL WITH GFR
ALT: 19 U/L (ref 0–44)
AST: 19 U/L (ref 15–41)
Albumin: 3.7 g/dL (ref 3.5–5.0)
Alkaline Phosphatase: 50 U/L (ref 38–126)
Anion gap: 10 (ref 5–15)
BUN: 28 mg/dL — ABNORMAL HIGH (ref 6–20)
CO2: 24 mmol/L (ref 22–32)
Calcium: 9.1 mg/dL (ref 8.9–10.3)
Chloride: 105 mmol/L (ref 98–111)
Creatinine, Ser: 1.35 mg/dL — ABNORMAL HIGH (ref 0.44–1.00)
GFR, Estimated: 47 mL/min — ABNORMAL LOW (ref >60)
Glucose, Bld: 121 mg/dL — ABNORMAL HIGH (ref 70–99)
Potassium: 3 mmol/L — ABNORMAL LOW (ref 3.5–5.1)
Sodium: 139 mmol/L (ref 135–145)
Total Bilirubin: 0.6 mg/dL (ref 0.0–1.2)
Total Protein: 6.9 g/dL (ref 6.5–8.1)

## 2024-05-18 LAB — TROPONIN I (HIGH SENSITIVITY): Troponin I (High Sensitivity): 5 ng/L (ref <18)

## 2024-05-18 LAB — BRAIN NATRIURETIC PEPTIDE: B Natriuretic Peptide: 93.9 pg/mL (ref 0.0–100.0)

## 2024-05-18 LAB — RESP PANEL BY RT-PCR (RSV, FLU A&B, COVID)  RVPGX2
Influenza A by PCR: NEGATIVE
Influenza B by PCR: NEGATIVE
Resp Syncytial Virus by PCR: NEGATIVE
SARS Coronavirus 2 by RT PCR: NEGATIVE

## 2024-05-18 LAB — MAGNESIUM: Magnesium: 1.9 mg/dL (ref 1.7–2.4)

## 2024-05-18 LAB — GROUP A STREP BY PCR: Group A Strep by PCR: NOT DETECTED

## 2024-05-18 MED ORDER — SODIUM CHLORIDE 0.9 % IV SOLN
2.0000 g | Freq: Two times a day (BID) | INTRAVENOUS | Status: DC
  Administered 2024-05-19 (×3): 2 g via INTRAVENOUS
  Filled 2024-05-18 (×4): qty 12.5

## 2024-05-18 MED ORDER — ACETAMINOPHEN 325 MG PO TABS
650.0000 mg | ORAL_TABLET | Freq: Four times a day (QID) | ORAL | Status: DC | PRN
  Administered 2024-05-19: 650 mg via ORAL
  Filled 2024-05-18: qty 2

## 2024-05-18 MED ORDER — BUMETANIDE 1 MG PO TABS
1.0000 mg | ORAL_TABLET | Freq: Two times a day (BID) | ORAL | Status: DC
  Administered 2024-05-19 – 2024-05-22 (×8): 1 mg via ORAL
  Filled 2024-05-18 (×8): qty 1

## 2024-05-18 MED ORDER — HYDROCORTISONE SOD SUC (PF) 100 MG IJ SOLR
100.0000 mg | Freq: Once | INTRAMUSCULAR | Status: AC
  Administered 2024-05-18: 100 mg via INTRAVENOUS
  Filled 2024-05-18: qty 2

## 2024-05-18 MED ORDER — FLUTICASONE FUROATE-VILANTEROL 200-25 MCG/ACT IN AEPB
1.0000 | INHALATION_SPRAY | Freq: Every day | RESPIRATORY_TRACT | Status: DC
  Filled 2024-05-18: qty 28

## 2024-05-18 MED ORDER — TIOTROPIUM BROMIDE MONOHYDRATE 1.25 MCG/ACT IN AERS: 2.0000 | INHALATION_SPRAY | Freq: Every day | RESPIRATORY_TRACT | Status: DC

## 2024-05-18 MED ORDER — LEVOTHYROXINE SODIUM 100 MCG PO TABS
100.0000 ug | ORAL_TABLET | Freq: Every day | ORAL | Status: DC
  Administered 2024-05-19 – 2024-05-22 (×4): 100 ug via ORAL
  Filled 2024-05-18 (×4): qty 1

## 2024-05-18 MED ORDER — ACETAMINOPHEN 650 MG RE SUPP: 650.0000 mg | Freq: Four times a day (QID) | RECTAL | Status: DC | PRN

## 2024-05-18 MED ORDER — ZOLPIDEM TARTRATE 5 MG PO TABS
5.0000 mg | ORAL_TABLET | Freq: Every evening | ORAL | Status: DC | PRN
  Administered 2024-05-19 – 2024-05-21 (×3): 5 mg via ORAL
  Filled 2024-05-18 (×4): qty 1

## 2024-05-18 MED ORDER — CLONAZEPAM 1 MG PO TABS
1.0000 mg | ORAL_TABLET | Freq: Three times a day (TID) | ORAL | Status: DC | PRN
  Administered 2024-05-19 – 2024-05-21 (×3): 1 mg via ORAL
  Filled 2024-05-18 (×4): qty 1

## 2024-05-18 MED ORDER — PANTOPRAZOLE SODIUM 40 MG PO TBEC
40.0000 mg | DELAYED_RELEASE_TABLET | Freq: Every day | ORAL | Status: DC
  Administered 2024-05-19 – 2024-05-20 (×2): 40 mg via ORAL
  Filled 2024-05-18 (×2): qty 1

## 2024-05-18 MED ORDER — HYOSCYAMINE SULFATE 0.125 MG SL SUBL: 0.1250 mg | SUBLINGUAL_TABLET | Freq: Four times a day (QID) | SUBLINGUAL | Status: DC | PRN

## 2024-05-18 MED ORDER — LAMOTRIGINE 25 MG PO TABS
50.0000 mg | ORAL_TABLET | Freq: Every morning | ORAL | Status: DC
  Administered 2024-05-19 – 2024-05-22 (×4): 50 mg via ORAL
  Filled 2024-05-18 (×4): qty 2

## 2024-05-18 MED ORDER — DIAZEPAM 5 MG/ML IJ SOLN
5.0000 mg | Freq: Once | INTRAMUSCULAR | Status: AC
  Administered 2024-05-18: 5 mg via INTRAVENOUS
  Filled 2024-05-18: qty 2

## 2024-05-18 MED ORDER — HEPARIN NA (PORK) LOCK FLSH PF 100 UNIT/ML IV SOLN: Freq: Every day | INTRAVENOUS | Status: DC

## 2024-05-18 MED ORDER — SODIUM CHLORIDE 0.9 % IV SOLN: 2.0000 g | Freq: Every day | INTRAVENOUS | Status: DC

## 2024-05-18 MED ORDER — BECLOMETHASONE DIPROP HFA 80 MCG/ACT IN AERB: 1.0000 | INHALATION_SPRAY | Freq: Two times a day (BID) | RESPIRATORY_TRACT | Status: DC

## 2024-05-18 MED ORDER — LAMOTRIGINE 100 MG PO TABS
200.0000 mg | ORAL_TABLET | Freq: Every day | ORAL | Status: DC
  Administered 2024-05-19 – 2024-05-21 (×4): 200 mg via ORAL
  Filled 2024-05-18 (×4): qty 2

## 2024-05-18 MED ORDER — POTASSIUM CHLORIDE ER 10 MEQ PO TBCR
10.0000 meq | EXTENDED_RELEASE_TABLET | Freq: Every day | ORAL | Status: DC
  Administered 2024-05-19 – 2024-05-20 (×3): 10 meq via ORAL
  Filled 2024-05-18 (×6): qty 1

## 2024-05-18 MED ORDER — TOPIRAMATE 25 MG PO TABS
150.0000 mg | ORAL_TABLET | Freq: Every day | ORAL | Status: DC
  Administered 2024-05-19 – 2024-05-21 (×4): 150 mg via ORAL
  Filled 2024-05-18 (×5): qty 2

## 2024-05-18 MED ORDER — FLUOXETINE HCL 20 MG PO CAPS
40.0000 mg | ORAL_CAPSULE | Freq: Every day | ORAL | Status: DC
  Administered 2024-05-19 – 2024-05-22 (×4): 40 mg via ORAL
  Filled 2024-05-18 (×5): qty 2

## 2024-05-18 MED ORDER — LORAZEPAM 0.5 MG PO TABS: 0.5000 mg | ORAL_TABLET | Freq: Three times a day (TID) | ORAL | Status: DC | PRN

## 2024-05-18 MED ORDER — IOHEXOL 300 MG/ML  SOLN
75.0000 mL | Freq: Once | INTRAMUSCULAR | Status: AC | PRN
  Administered 2024-05-18: 75 mL via INTRAVENOUS

## 2024-05-18 MED ORDER — HYDROCORTISONE SOD SUC (PF) 100 MG IJ SOLR
100.0000 mg | Freq: Three times a day (TID) | INTRAMUSCULAR | Status: DC
  Administered 2024-05-19 – 2024-05-22 (×10): 100 mg via INTRAVENOUS
  Filled 2024-05-18 (×10): qty 2

## 2024-05-18 MED ORDER — SACUBITRIL-VALSARTAN 24-26 MG PO TABS
1.0000 | ORAL_TABLET | Freq: Every day | ORAL | Status: DC
  Administered 2024-05-19: 1 via ORAL
  Filled 2024-05-18 (×3): qty 1

## 2024-05-18 MED ORDER — ROSUVASTATIN CALCIUM 10 MG PO TABS
10.0000 mg | ORAL_TABLET | Freq: Every day | ORAL | Status: DC
  Administered 2024-05-19 – 2024-05-22 (×4): 10 mg via ORAL
  Filled 2024-05-18 (×4): qty 1

## 2024-05-18 MED ORDER — GUAIFENESIN ER 600 MG PO TB12
1200.0000 mg | ORAL_TABLET | Freq: Two times a day (BID) | ORAL | Status: DC
  Administered 2024-05-19 – 2024-05-21 (×6): 1200 mg via ORAL
  Filled 2024-05-18 (×6): qty 2

## 2024-05-18 MED ORDER — ASPIRIN 81 MG PO TBEC
81.0000 mg | DELAYED_RELEASE_TABLET | Freq: Every day | ORAL | Status: DC
  Administered 2024-05-19 – 2024-05-21 (×4): 81 mg via ORAL
  Filled 2024-05-18 (×4): qty 1

## 2024-05-18 MED ORDER — IPRATROPIUM-ALBUTEROL 0.5-2.5 (3) MG/3ML IN SOLN
3.0000 mL | Freq: Once | RESPIRATORY_TRACT | Status: AC
  Administered 2024-05-18: 3 mL via RESPIRATORY_TRACT
  Filled 2024-05-18: qty 3

## 2024-05-18 MED ORDER — SODIUM CHLORIDE 0.9 % IV SOLN
500.0000 mg | Freq: Once | INTRAVENOUS | Status: AC
  Administered 2024-05-18: 500 mg via INTRAVENOUS
  Filled 2024-05-18: qty 5

## 2024-05-18 MED ORDER — NITROGLYCERIN 0.4 MG SL SUBL: 0.4000 mg | SUBLINGUAL_TABLET | SUBLINGUAL | Status: DC | PRN

## 2024-05-18 MED ORDER — SODIUM CHLORIDE 0.9 % IV SOLN: 500.0000 mg | Freq: Every day | INTRAVENOUS | Status: DC

## 2024-05-18 MED ORDER — DIPHENHYDRAMINE HCL 12.5 MG/5ML PO ELIX
25.0000 mg | ORAL_SOLUTION | Freq: Four times a day (QID) | ORAL | Status: DC | PRN
  Administered 2024-05-19: 25 mg via ORAL
  Filled 2024-05-18 (×2): qty 10

## 2024-05-18 MED ORDER — ENOXAPARIN SODIUM 40 MG/0.4ML IJ SOSY
40.0000 mg | PREFILLED_SYRINGE | INTRAMUSCULAR | Status: DC
  Administered 2024-05-20 – 2024-05-22 (×4): 40 mg via SUBCUTANEOUS
  Filled 2024-05-18 (×4): qty 0.4

## 2024-05-18 MED ORDER — HYDROMORPHONE HCL 1 MG/ML IJ SOLN
1.0000 mg | Freq: Once | INTRAMUSCULAR | Status: AC
  Administered 2024-05-18: 1 mg via INTRAVENOUS
  Filled 2024-05-18: qty 1

## 2024-05-18 MED ORDER — SODIUM CHLORIDE 0.9 % IV SOLN
500.0000 mg | INTRAVENOUS | Status: DC
  Administered 2024-05-19 – 2024-05-20 (×2): 500 mg via INTRAVENOUS
  Filled 2024-05-18 (×2): qty 5

## 2024-05-18 MED ORDER — EPINEPHRINE 0.3 MG/0.3ML IJ SOAJ: 0.3000 mg | INTRAMUSCULAR | Status: DC | PRN

## 2024-05-18 MED ORDER — BUPROPION HCL ER (XL) 150 MG PO TB24
150.0000 mg | ORAL_TABLET | Freq: Every day | ORAL | Status: DC
  Administered 2024-05-19 – 2024-05-22 (×4): 150 mg via ORAL
  Filled 2024-05-18 (×4): qty 1

## 2024-05-18 MED ORDER — ALBUTEROL SULFATE (2.5 MG/3ML) 0.083% IN NEBU
2.5000 mg | INHALATION_SOLUTION | RESPIRATORY_TRACT | Status: DC | PRN
  Administered 2024-05-20 (×2): 2.5 mg via RESPIRATORY_TRACT
  Filled 2024-05-18 (×2): qty 3

## 2024-05-18 MED ORDER — OLANZAPINE 10 MG PO TABS
5.0000 mg | ORAL_TABLET | Freq: Every day | ORAL | Status: DC
  Administered 2024-05-19 – 2024-05-21 (×4): 5 mg via ORAL
  Filled 2024-05-18 (×2): qty 1
  Filled 2024-05-18: qty 0.5
  Filled 2024-05-18: qty 1

## 2024-05-18 MED ORDER — OXYCODONE HCL 5 MG PO TABS
5.0000 mg | ORAL_TABLET | ORAL | Status: DC | PRN
  Administered 2024-05-19 – 2024-05-22 (×10): 5 mg via ORAL
  Filled 2024-05-18 (×10): qty 1

## 2024-05-18 MED ORDER — SACCHAROMYCES BOULARDII 250 MG PO CAPS
250.0000 mg | ORAL_CAPSULE | Freq: Two times a day (BID) | ORAL | Status: DC
  Administered 2024-05-19 – 2024-05-22 (×5): 250 mg via ORAL
  Filled 2024-05-18 (×7): qty 1

## 2024-05-18 MED ORDER — SENNOSIDES-DOCUSATE SODIUM 8.6-50 MG PO TABS: 1.0000 | ORAL_TABLET | Freq: Every evening | ORAL | Status: DC | PRN

## 2024-05-18 MED ORDER — POTASSIUM CHLORIDE CRYS ER 20 MEQ PO TBCR
40.0000 meq | EXTENDED_RELEASE_TABLET | Freq: Once | ORAL | Status: AC
  Administered 2024-05-19: 40 meq via ORAL
  Filled 2024-05-18: qty 2

## 2024-05-18 MED ORDER — TRAMADOL HCL 50 MG PO TABS
50.0000 mg | ORAL_TABLET | Freq: Once | ORAL | Status: AC
  Administered 2024-05-18: 50 mg via ORAL
  Filled 2024-05-18: qty 1

## 2024-05-18 MED ORDER — CLONAZEPAM 0.5 MG PO TABS
1.0000 mg | ORAL_TABLET | Freq: Once | ORAL | Status: AC
  Administered 2024-05-18: 1 mg via ORAL
  Filled 2024-05-18: qty 2

## 2024-05-18 MED ORDER — RIMEGEPANT SULFATE 75 MG PO TBDP: 1.0000 | ORAL_TABLET | Freq: Every day | ORAL | Status: DC | PRN

## 2024-05-18 MED ORDER — SODIUM CHLORIDE 0.9 % IV SOLN
1.0000 g | Freq: Once | INTRAVENOUS | Status: AC
  Administered 2024-05-18: 1 g via INTRAVENOUS
  Filled 2024-05-18: qty 10

## 2024-05-18 NOTE — ED Notes (Signed)
MD in the room.

## 2024-05-18 NOTE — Assessment & Plan Note (Signed)
 Noted. Continued synthroid .

## 2024-05-18 NOTE — ED Notes (Signed)
 Pt started crying and stating she needed to go home. Didn't know where she was. Husband said this is a normal thing for her. MD came to bedside and ordered home meds to help with anxiety

## 2024-05-18 NOTE — ED Triage Notes (Signed)
 Pt reports increased SHOB since yesterday. Pt reports a hx of CHF and a pulmonary condition. 4L  baseline. Pt has pain in her chest from coughing.

## 2024-05-18 NOTE — Assessment & Plan Note (Signed)
 K of 3.0. Will supplement and monitor.

## 2024-05-18 NOTE — Assessment & Plan Note (Signed)
Noted. Monitor.

## 2024-05-18 NOTE — Assessment & Plan Note (Signed)
 Creatinine is at baseline at 1.35. Avoid nephrotoxic agents, monitor creatinine, electrolytes, and volume status.

## 2024-05-18 NOTE — Assessment & Plan Note (Signed)
 Noted. Monitor volume status carefully.

## 2024-05-18 NOTE — Assessment & Plan Note (Addendum)
 Due to pneumonia. Patient presented with leukocytosis, increased oxygen  requirements, and tachycardia. The patient is receiving IV ceftriaxone  and azithromycin .

## 2024-05-18 NOTE — Assessment & Plan Note (Signed)
 Patient is receiving stress dose hydrocortisone .

## 2024-05-18 NOTE — ED Provider Triage Note (Signed)
 Emergency Medicine Provider Triage Evaluation Note  [name] , a 53 y.o. female  was evaluated in triage.  Pt complains of 2 day Hx of worsening shortness of breath low O2 saturations at home, reporting O2 sats of 85-91% on 5L . Hx of heart and respiratory failure and multiple bouts of cancer, has been using rescue medications without much relief. On baseline 5L O2.   Endorses worsening cough accompanied by R sided chest pain. Cough present 3 days. Endorses color change in mucus from white to brown.   Has wound on R dorsal foot x 1.5 month.  Endorses body aches, chills Denies fever, abdominal pain, vomiting, diarrhea, dysuria, leg swelling  Review of Systems  Positive: N/a Negative: N/a  Physical Exam  BP 135/76 (BP Location: Left Arm)   Pulse 63   Temp 98.7 F (37.1 C) (Oral)   Resp 18   LMP  (LMP Unknown)   SpO2 97%  Gen:   Awake, no distress   Resp:  Normal effort  MSK:   Moves extremities without difficulty  Other:    Medical Decision Making  Medically screening exam initiated at 1:45 PM.  Appropriate orders placed.  LUGENE HITT was informed that the remainder of the evaluation will be completed by another provider, this initial triage assessment does not replace that evaluation, and the importance of remaining in the ED until their evaluation is complete.     Beola Terrall RAMAN, [place] 05/18/24 1352

## 2024-05-18 NOTE — ED Provider Notes (Signed)
 [place] EMERGENCY DEPARTMENT AT [facility] Provider Note   CSN: [id number] Arrival date & time: 05/18/24  1325     Patient presents with: Shortness of Breath   [name] is a 52 y.o. female.  With a history of heart failure, pulmonary hypertension, Hodgkin's lymphoma and Addison disease (shortness of breath.  Patient is on 5 L O2 at baseline.  Over the last 2 days she has experienced increasing worsening of her breathing at home.  Oxygen  saturations in the low 80s at home which is unusual for her.  No fevers chills.  Chest pain worse with productive coughing.    Shortness of Breath      Prior to Admission medications   Medication Sig Start Date End Date Taking? Authorizing Provider  albuterol  (PROVENTIL ) (2.5 MG/3ML) 0.083% nebulizer solution INHALE 3 ML BY NEBULIZATION EVERY 6 HOURS AS NEEDED FOR WHEEZING OR SHORTNESS OF BREATH 12/13/23   Kassie Acquanetta Bradley, MD  albuterol  (VENTOLIN  HFA) 108 (90 Base) MCG/ACT inhaler Inhale 2 puffs into the lungs every 6 (six) hours as needed for wheezing or shortness of breath. 01/08/24   Kassie Acquanetta Bradley, MD  amoxicillin -clavulanate (AUGMENTIN ) 875-125 MG tablet Take 1 tablet by mouth 2 (two) times daily. 02/26/24   Sherwood Drivers, PA-C  aspirin  EC 81 MG tablet Take 1 tablet (81 mg total) by mouth daily. Swallow whole. Patient taking differently: Take 81 mg by mouth at bedtime. Chewable 07/16/22   Nahser, Aleene PARAS, MD  Bismuth Tribromoph-Petrolatum (XEROFORM OCCLUSIVE GAUZE STRIP) PADS Apply 1 each topically as directed. 09/17/22   Tobie Yetta CHRISTELLA, MD  bumetanide  (BUMEX ) 1 MG tablet Take 1 tablet (1 mg total) by mouth 2 (two) times daily. 03/11/24   Nahser, Aleene PARAS, MD  buPROPion  (WELLBUTRIN  XL) 150 MG 24 hr tablet TAKE 1 TABLET BY MOUTH EVERY DAY 04/24/24   Mozingo, Regina Nattalie, NP  clonazePAM  (KLONOPIN ) 1 MG tablet TAKE 1 TABLET BY MOUTH 3 TIMES A DAY AS NEEDED AND 1 EXTRA FOR SEVERE ANXIETY UP TO 10 A MONTH. 04/30/24   Mozingo,  Regina Nattalie, NP  dexlansoprazole  (DEXILANT ) 60 MG capsule Take 1 capsule (60 mg total) by mouth daily. 04/13/24   Cirigliano, Vito V, DO  diclofenac  (VOLTAREN ) 50 MG EC tablet Take 50 mg by mouth 2 (two) times daily. *as needed for migraine, Can repeat if needed* 07/23/23   [provider]  diphenhydrAMINE  (BENADRYL ) 12.5 MG/5ML elixir Take 10 mLs (25 mg total) by mouth every 6 (six) hours as needed (nausea). 09/17/22   Tobie Yetta CHRISTELLA, MD  doxycycline  (VIBRAMYCIN ) 100 MG capsule Take 1 capsule (100 mg total) by mouth 2 (two) times daily. 03/25/24   Zackowski, Scott, MD  EMGALITY  120 MG/ML SOSY Inject 120 mg into the skin every 28 (twenty-eight) days.    [provider]  EPINEPHRINE  0.3 mg/0.3 mL IJ SOAJ injection INJECT 0.3 MG INTO THE MUSCLE AS NEEDED FOR ANAPHYLAXIS. 04/25/23   Kassie Acquanetta Bradley, MD  famotidine  (PEPCID ) 40 MG/5ML suspension Take 2.5 mLs (20 mg total) by mouth daily. 04/13/24 05/13/24  Cirigliano, Vito V, DO  FLORASTOR 250 MG capsule Take 250 mg by mouth 2 (two) times daily. Mid Morning and Mid Afternoon    [provider]  FLUoxetine  (PROZAC ) 40 MG capsule Take 1 capsule (40 mg total) by mouth daily. 04/30/24   Mozingo, Regina Nattalie, NP  fluticasone -salmeterol (ADVAIR HFA) 230-21 MCG/ACT inhaler Inhale 2 puffs into the lungs 2 (two) times daily. 01/08/24   Kassie,  Acquanetta Bradley, MD  Heparin  Na, Pork, Lock Flsh PF (BD HEPARIN  POSIFLUSH) 100 UNIT/ML SOLN USE 5 MLS IN PORT A CATH ONCE DAILY AFTER MEDICATION ADMINISTRATION AS A HEPLOCK AS DIRECTED. 04/01/24     hydrocortisone  (CORTEF ) 10 MG tablet Take 1-2 tablets (10-20 mg total) by mouth See admin instructions. Take 20 mg in the am and 10mg  in the evening. Take after completing prednisone  Patient taking differently: Take 10-20 mg by mouth See admin instructions. Take 20 mg in the am at 10 am and 10 mg in the afternoon. Take after completing prednisone  09/29/22   Patel, Pranav M, MD  hyoscyamine  (LEVSIN AMIEL) 0.125 MG  SL tablet Place 1 tablet (0.125 mg total) under the tongue every 6 (six) hours as needed (esophageal spasm). Up to 1.25 mg daily 12/22/23   Harris, Abigail, PA-C  lamoTRIgine  (LAMICTAL ) 200 MG tablet TAKE 1 TABLET BY MOUTH EVERYDAY AT BEDTIME 02/27/24   Mozingo, Regina Nattalie, NP  lamoTRIgine  (LAMICTAL ) 25 MG tablet Take two tablets every morning. 02/27/24   Mozingo, Regina Nattalie, NP  Lancets (ONETOUCH DELICA PLUS Primera) MISC 3 (three) times daily. for testing 06/12/21   [provider]  lidocaine  (XYLOCAINE ) 2 % solution Use as directed 10 mLs in the mouth or throat every 4 (four) hours as needed (for esophageal pain and pain with swallowing.). 12/22/23   Harris, Abigail, PA-C  LORazepam  (ATIVAN ) 0.5 MG tablet Take 1 tablet (0.5 mg total) by mouth 3 (three) times daily as needed for anxiety. 04/08/24   May, Deanna J, NP  nitroGLYCERIN  (NITROSTAT ) 0.4 MG SL tablet DISSOLVE 1 TAB UNDER THE TONGUE EVERY 5 MINUTES AS NEEDED FOR CHEST PAIN. MAX OF 3 DOSES, THEN 911. 07/03/23   Nahser, Aleene PARAS, MD  NURTEC 75 MG TBDP Take 1 tablet by mouth daily as needed. 09/03/23   [provider]  OLANZapine  (ZYPREXA ) 5 MG tablet Take 1 tablet (5 mg total) by mouth at bedtime. 02/27/24   Mozingo, Regina Nattalie, NP  [facility] VERIO test strip 3 (three) times daily. for testing 06/12/21   [provider]  OXYGEN  Inhale 4-5 L/min into the lungs continuous.    [provider]  potassium chloride  (KLOR-CON ) 10 MEQ tablet as needed. 12/01/23   [provider]  PROAIR  HFA 108 (90 Base) MCG/ACT inhaler Inhale 2 puffs into the lungs every 4 (four) hours as needed for wheezing or shortness of breath.    [provider]  QVAR  REDIHALER 80 MCG/ACT inhaler Inhale 1 puff into the lungs 2 (two) times daily. 05/10/23   [provider]  rosuvastatin  (CRESTOR ) 10 MG tablet Take 10 mg by mouth in the morning. Mid morning 02/28/18   [provider]  sacubitril -valsartan   (ENTRESTO ) 24-26 MG TAKE 1 TABLET BY MOUTH TWICE A DAY Patient taking differently: Take 1 tablet by mouth daily. 07/03/23   Nahser, Aleene PARAS, MD  sodium chloride  flush (BD POSIFLUSH) 0.9 % SOLN injection Use as directed for catheter maintenance 02/07/24     Sodium Chloride  Flush (NORMAL SALINE FLUSH) 0.9 % SOLN Use as directed for central venous catheter maintenance 01/31/23     Sodium Chloride  Flush (NORMAL SALINE FLUSH) 0.9 % SOLN Use as directed for catheter maintenance 11/07/23     Sodium Chloride  Flush (NORMAL SALINE FLUSH) 0.9 % SOLN Use 10 mLs as directed for catheter maintenance. 02/10/24     Sodium Chloride  Flush (NORMAL SALINE FLUSH) 0.9 % SOLN Use as directed for catheter maintenance. 02/10/24     sucralfate  (CARAFATE )  1 GM/10ML suspension Take 1 g by mouth daily as needed (as directed for ulcers).    [provider]  SYNTHROID  100 MCG tablet Take 1 tablet (100 mcg total) by mouth every morning. Patient taking differently: Take 100 mcg by mouth every morning. * Must be name brand* 10/19/21   Gudena, Vinay, MD  Tiotropium Bromide  Monohydrate (SPIRIVA  RESPIMAT) 1.25 MCG/ACT AERS Inhale 2 puffs into the lungs daily. 01/08/24   Kassie Acquanetta Bradley, MD  topiramate  (TOPAMAX ) 50 MG tablet Take 150 mg by mouth at bedtime. 12/25/19   [provider]  zolpidem  (AMBIEN  CR) 12.5 MG CR tablet TAKE ONE TABLET BY MOUTH AT BEDTIME AS NEEDED FOR SLEEP. 05/14/23       Allergies: Ferrous bisglycinate chelate [iron], Mushroom extract complex (obsolete), Na ferric gluc cplx in sucrose, Cymbalta [duloxetine hcl], Hydromorphone , Ondansetron  hcl, Promethazine, Succinylcholine, Buprenorphine hcl, Compazine, Duloxetine, Metoclopramide, Morphine and codeine, Ondansetron , Promethazine hcl, and Tegaderm ag mesh [silver ]    Review of Systems  Respiratory:  Positive for shortness of breath.     Updated Vital Signs BP 135/76 (BP Location: Left Arm)   Pulse 63   Temp 98.7 F (37.1 C) (Oral)   Resp 18   LMP   (LMP Unknown)   SpO2 97%   Physical Exam Vitals and nursing note reviewed.  HENT:     Head: Normocephalic and atraumatic.  Eyes:     Pupils: Pupils are equal, round, and reactive to light.  Cardiovascular:     Rate and Rhythm: Normal rate and regular rhythm.  Pulmonary:     Effort: Pulmonary effort is normal.     Breath sounds: Examination of the right-lower field reveals rales. Examination of the left-lower field reveals rales. Rales present.  Abdominal:     Palpations: Abdomen is soft.     Tenderness: There is no abdominal tenderness.  Skin:    General: Skin is warm and dry.  Neurological:     Mental Status: She is alert.  Psychiatric:        Mood and Affect: Mood normal.     (all labs ordered are listed, but only abnormal results are displayed) Labs Reviewed  CBC WITH DIFFERENTIAL/PLATELET - Abnormal; Notable for the following components:      Result Value   WBC 16.7 (*)    RBC 3.64 (*)    Hemoglobin 7.9 (*)    HCT 27.2 (*)    MCV 74.7 (*)    MCH 21.7 (*)    MCHC 29.0 (*)    RDW 17.8 (*)    Neutro Abs 14.0 (*)    Abs Immature Granulocytes 0.09 (*)    Abs Granulocyte 14.0 (*)    All other components within normal limits  COMPREHENSIVE METABOLIC PANEL WITH GFR - Abnormal; Notable for the following components:   Potassium 3.0 (*)    Glucose, Bld 121 (*)    BUN 28 (*)    Creatinine, Ser 1.35 (*)    GFR, Estimated 47 (*)    All other components within normal limits  RESP PANEL BY RT-PCR (RSV, FLU A&B, COVID)  [id number]  GROUP A STREP BY PCR  BRAIN NATRIURETIC PEPTIDE  MAGNESIUM   CBC WITH DIFFERENTIAL/PLATELET  TROPONIN I (HIGH SENSITIVITY)  TROPONIN I (HIGH SENSITIVITY)    EKG: EKG Interpretation Date/Time:  Monday May 18 2024 14:25:17 EDT Ventricular Rate:  101 PR Interval:  132 QRS Duration:  85 QT Interval:  374 QTC Calculation: 485 R Axis:   -22  Text Interpretation:  Sinus tachycardia Probable left atrial enlargement Abnormal R-wave progression,  early transition Left ventricular hypertrophy Confirmed by Pamella Sharper [phone]) on 05/18/2024 4:32:47 PM  Radiology: [facility] Chest 2 View Result Date: 05/18/2024 CLINICAL DATA:  Chest pain and worsening cough. EXAM: CHEST - 2 VIEW COMPARISON:  Chest radiograph December 22, 2023. FINDINGS: Interval increase in bilateral ground-glass opacities in predominantly involving bilateral perihilar and lower hemi thoraces. Left PICC line tip terminates in distal SVC. Interval removal of right central line. The heart size is normal. Bilateral surgical clips identified in right breast/axilla. Trace pleural effusion on the right. No acute osseous lesion. IMPRESSION: Interval increase in ground-glass opacities involving bilateral perihilar and lower lobes which may represent edema versus airspace consolidations. Recommend chest CT for further assessment if clinically warranted. Electronically Signed   By: Megan  Zare M.D.   On: 05/18/2024 15:11     Procedures   Medications Ordered in the ED  cefTRIAXone  (ROCEPHIN ) 1 g in sodium chloride  0.9 % 100 mL IVPB (has no administration in time range)  azithromycin  (ZITHROMAX ) 500 mg in sodium chloride  0.9 % 250 mL IVPB (500 mg Intravenous New Bag/Given 05/18/24 1539)  traMADol  (ULTRAM ) tablet 50 mg (has no administration in time range)  hydrocortisone  sodium succinate  (SOLU-CORTEF ) 100 MG injection 100 mg (has no administration in time range)  ipratropium-albuterol  (DUONEB) 0.5-2.5 (3) MG/3ML nebulizer solution 3 mL (3 mLs Nebulization Given 05/18/24 1431)  clonazePAM  (KLONOPIN ) tablet 1 mg (1 mg Oral Given 05/18/24 1452)                                    Medical Decision Making 53 year old female with history as above presenting for acute worsening of her breathing.  Multiple chronic pulmonary issues.  Stable on home O2 but was reportedly hypoxemic at home.  Differential diagnosis would include pulmonary hypertension, heart failure exacerbation, viral respiratory illness,  pneumonia or PE.  Initial laboratory workup shows leukocytosis with new infiltrates on chest x-ray consistent with community-acquired pneumonia care.  Will treat with Rocephin  and azithromycin .  Radiology recommended CT chest to better characterize which we will obtain here.  Discussed with admitting hospitalist accepts patient for admission  Amount and/or Complexity of Data Reviewed Labs: ordered. Radiology: ordered.  Risk Prescription drug management.        Final diagnoses:  Community acquired pneumonia, unspecified laterality    ED Discharge Orders     None          Pamella Sharper LABOR, DO 05/18/24 1633

## 2024-05-18 NOTE — Assessment & Plan Note (Signed)
 Patient is on 4L O2 at baseline. She has increased oxygen  requirements since presentation. She was receiving 8L when I was in the room.

## 2024-05-18 NOTE — H&P (Signed)
 History and Physical    Patient: [name] FMW:[id number] DOB: Mar 23, 1971 DOA: 05/18/2024 DOS: the patient was seen and examined on 05/18/2024 PCP: Toppin, [name], MD  Patient coming from: Home  Chief Complaint:  Chief Complaint  Patient presents with   Shortness of Breath   HPI: [name] is a 53 y.o. female with medical history significant of Hodgkins Lymphoma, Pulmonary Artery Hypertension, Heart failure, Addisons Disease, Migraines. She presents with 2 days of fevers, chills, cough, sputum production, and shortness of breath.   In the ED she was found to have oxygen  requirements of 8L. She is on 4L chronically at home. CXR demonstrated an interval increase in ground-glass opacities involving bilateral perihilar and lower lobes.CT chest demonstrated multifocal bilateral airspace disease most pronounced in the lower lobes. The appearance is most consistent with multifocal bilateral bronchopneumonia.  WBC is elevated at 16.7. In the ED she was tachypneic with a RR of 29 and tachycardic with a heart rate of 107. With a known infectious source she meets criteria for severe sepsis.   She received the first stress dose of hydrocortisone , Ceftriaxone , and azithromycin  in the ED. Blood cultures x 2 have been obtained. She will be continued on her home medications. She will be admitted to a med/surg bed.  Review of Systems: As mentioned in the history of present illness. All other systems reviewed and are negative. Past Medical History:  Diagnosis Date   Addison's disease (HCC)    Adrenal insufficiency (HCC)    Anemia    Anxiety    Aortic stenosis    Aortic stenosis    Appendicitis 12/19/2009   Appendicitis    Breast cancer (HCC)    STATUS POST BILATERAL MASTECTOMY. STATUS POST RECONSTRUCTION. SHE HAD SILICONE BREAST IMPLANTS AND THE LEFT IMPLANT IS LEAKING SLIGHTLY   Cellulitis of right middle finger 11/07/2018   Cervical cancer (HCC) 12/23/2018   Chest pain    CHF with right  heart failure (HCC) 04/17/2017   Chronic respiratory failure with hypoxia (HCC) 12/23/2018   CKD (chronic kidney disease) stage 3, GFR 30-59 ml/min (HCC)    Cough variant asthma 04/13/2019   Depression    Functional neurological symptom disorder with mixed symptoms 2023   GERD (gastroesophageal reflux disease)    takes Dexilant  and carafate  and gi coctail    Headache    migraines on a daily and monthly regimen    Heart murmur    History of kidney stones    Hodgkin lymphoma (HCC)    STATUS POST MANTLE RADIATION   Hodgkin's lymphoma (HCC)    1987   Hypertension    Hypoxia    Multiple lung nodules    bilateral   Necrotizing fasciitis (HCC) 12/23/2018   Non-ischemic cardiomyopathy (HCC)    Osteoporosis    Ovarian tumor (benign)    bilateral   Palpitations    Pituitary adenoma (HCC) 12/23/2018   Pneumonia    PONV (postoperative nausea and vomiting)    Pre-diabetes    per pt; no meds   Pulmonary hypertension (HCC) 12/23/2018   Raynaud phenomenon    Right heart failure (HCC) 04/17/2017   Seizures (HCC)    last febrile seizure was approx 3 weeks ago per report on 12/01/2020   Supplemental oxygen  dependent    3 liters   SVT (supraventricular tachycardia) (HCC)    Tachycardia    Thyroid  cancer (HCC)    STATUS POST SURGICAL REMOVAL-CURRENT ON THYROID  REPLACEMENT   Past Surgical History:  Procedure  Laterality Date   ABDOMINAL HYSTERECTOMY     AMPUTATION Left 01/30/2019   Procedure: Left Index finger amputation with flap reconstruction and repair reconstruction;  Surgeon: Camella Fallow, MD;  Location: [facility];  Service: Orthopedics;  Laterality: Left;   APPENDECTOMY     BIOPSY OF SKIN SUBCUTANEOUS TISSUE AND/OR MUCOUS MEMBRANE  12/26/2023   Procedure: BIOPSY, SKIN, SUBCUTANEOUS TISSUE, OR MUCOUS MEMBRANE;  Surgeon: Leigh Elspeth SQUIBB, MD;  Location: [facility];  Service: Gastroenterology;;   breast implants and removal      breast implants but leaking      CARDIAC  CATHETERIZATION  05/18/2009   NORMAL CATH   CESAREAN SECTION  2007   and 1997   COLONOSCOPY     ESOPHAGEAL DILATION  02/11/2024   Procedure: DILATION, ESOPHAGUS;  Surgeon: San Sandor GAILS, DO;  Location: [facility];  Service: Gastroenterology;;   ESOPHAGOGASTRODUODENOSCOPY N/A 12/26/2023   Procedure: EGD (ESOPHAGOGASTRODUODENOSCOPY);  Surgeon: Leigh Elspeth SQUIBB, MD;  Location: [facility];  Service: Gastroenterology;  Laterality: N/A;   ESOPHAGOGASTRODUODENOSCOPY N/A 02/11/2024   Procedure: EGD (ESOPHAGOGASTRODUODENOSCOPY);  Surgeon: San Sandor GAILS, DO;  Location: [facility];  Service: Gastroenterology;  Laterality: N/A;   ESOPHAGOGASTRODUODENOSCOPY N/A 04/13/2024   Procedure: EGD (ESOPHAGOGASTRODUODENOSCOPY);  Surgeon: San Sandor GAILS, DO;  Location: [facility];  Service: Gastroenterology;  Laterality: N/A;   HOT HEMOSTASIS N/A 12/26/2023   Procedure: EGD, WITH ARGON PLASMA COAGULATION;  Surgeon: Leigh Elspeth SQUIBB, MD;  Location: [facility];  Service: Gastroenterology;  Laterality: N/A;   hx of chemotherapy      hx of radiation therapy      I & D EXTREMITY Left 12/23/2018   Procedure: IRRIGATION AND DEBRIDEMENT HAND / INDEX FINGER;  Surgeon: Camella Fallow, MD;  Location: [facility];  Service: Orthopedics;  Laterality: Left;   IR CV LINE INJECTION  03/22/2022   IR FLUORO GUIDE CV LINE RIGHT  10/09/2022   IR FLUORO GUIDE CV LINE RIGHT  01/18/2023   IR FLUORO GUIDE CV LINE RIGHT  03/26/2023   IR FLUORO GUIDE CV LINE RIGHT  10/10/2023   IR FLUORO GUIDE CV LINE RIGHT  01/06/2024   IR IMAGING GUIDED PORT INSERTION  05/06/2020   IR IMAGING GUIDED PORT INSERTION  12/04/2021   IR IMAGING GUIDED PORT INSERTION  02/25/2024   IR PATIENT EVAL TECH 0-60 MINS  03/12/2024   IR RADIOLOGIST EVAL & MGMT  03/12/2024   IR RADIOLOGIST EVAL & MGMT  03/31/2024   IR REMOVAL TUN ACCESS W/ PORT W/O FL MOD SED  04/27/2020   IR REMOVAL TUN ACCESS W/ PORT W/O FL MOD SED  12/04/2021   IR REMOVAL TUN  ACCESS W/ PORT W/O FL MOD SED  03/30/2022   IR REMOVAL TUN ACCESS W/ PORT W/O FL MOD SED  02/27/2024   IR REMOVAL TUN CV CATH W/O FL  02/25/2024   IR US  GUIDE VASC ACCESS RIGHT  10/09/2022   KIDNEY STONE SURGERY     LUMBAR PUNCTURE W/ INTRATHECAL CHEMOTHERAPY     MASTECTOMY     PITUITARY SURGERY     RIGHT/LEFT HEART CATH AND CORONARY ANGIOGRAPHY N/A 04/02/2018   Procedure: RIGHT/LEFT HEART CATH AND CORONARY ANGIOGRAPHY;  Surgeon: Verlin Lonni BIRCH, MD;  [facility]: [facility];  Service: Cardiovascular;  Laterality: N/A;   RIGHT/LEFT HEART CATH AND CORONARY ANGIOGRAPHY N/A 08/31/2022   Procedure: RIGHT/LEFT HEART CATH AND CORONARY ANGIOGRAPHY;  Surgeon: Cherrie Toribio SAUNDERS, MD;  Location: [facility];  Service: Cardiovascular;  Laterality: N/A;  TOOTH EXTRACTION N/A 12/05/2020   Procedure: DENTAL RESTORATION/EXTRACTIONS;  Surgeon: Arvil Lonni RAMAN, MD;  Location: [facility];  Service: Oral Surgery;  Laterality: N/A;   TOTAL THYROIDECTOMY     VIDEO BRONCHOSCOPY Bilateral 11/14/2018   Procedure: VIDEO BRONCHOSCOPY WITHOUT FLUORO;  Surgeon: Kassie Acquanetta Bradley, MD;  Location: [facility];  Service: Cardiopulmonary;  Laterality: Bilateral;   VIDEO BRONCHOSCOPY WITH ENDOBRONCHIAL ULTRASOUND N/A 11/19/2018   Procedure: VIDEO BRONCHOSCOPY WITH ENDOBRONCHIAL ULTRASOUND;  Surgeon: Kassie Acquanetta Bradley, MD;  Location: [facility] OR;  Service: Thoracic;  Laterality: N/A;   Social History:  reports that she has never smoked. She has never used smokeless tobacco. She reports that she does not currently use alcohol. She reports that she does not use drugs.  Allergies  Allergen Reactions   Ferrous Bisglycinate Chelate [Iron] Anaphylaxis and Other (See Comments)    Only IV - only FERRLICET   Mushroom Extract Complex (Obsolete) Anaphylaxis   Na Ferric Gluc Cplx In Sucrose Anaphylaxis   Cymbalta [Duloxetine Hcl] Swelling and Anxiety   Hydromorphone  Other (See Comments)    BP drop and heart rate  drops 5.1.20 PT REPORTS THAT SHE TAKES DILAUDID  AT HOME   Morphine Other (See Comments)    I clawed out my IV(s)   Ondansetron  Hcl Itching and Other (See Comments)    Clawed off my skin   Promethazine Other (See Comments)    Made me to clinch up and my arms shook   Succinylcholine Other (See Comments)    Lock Jaw   Buprenorphine Hcl Hives   Compazine Other (See Comments)    Altered mental status Aggression   Duloxetine    Metoclopramide Other (See Comments)    Dystonia   Morphine And Codeine Hives   Ondansetron  Hives and Rash    Other reaction(s): Unknown Other reaction(s): Unknown   Promethazine Hcl Hives   Tegaderm Ag Mesh [Silver ] Rash and Other (See Comments)    Old formulation only, is able to tolerate new formulation    Family History  Adopted: Yes  Family history unknown: Yes    Prior to Admission medications   Medication Sig Start Date End Date Taking? Authorizing Provider  albuterol  (VENTOLIN  HFA) 108 (90 Base) MCG/ACT inhaler Inhale 2 puffs into the lungs every 6 (six) hours as needed for wheezing or shortness of breath. 01/08/24  Yes Kassie Acquanetta Bradley, MD  ALPRAZolam  (XANAX ) 1 MG tablet Take 1 mg by mouth 2 (two) times daily as needed for anxiety (or panic).   Yes [provider]  bumetanide  (BUMEX ) 1 MG tablet Take 1 tablet (1 mg total) by mouth 2 (two) times daily. 03/11/24  Yes Nahser, Aleene PARAS, MD  buPROPion  (WELLBUTRIN  XL) 150 MG 24 hr tablet TAKE 1 TABLET BY MOUTH EVERY DAY 04/24/24  Yes Mozingo, Regina Nattalie, NP  clonazePAM  (KLONOPIN ) 1 MG tablet TAKE 1 TABLET BY MOUTH 3 TIMES A DAY AS NEEDED AND 1 EXTRA FOR SEVERE ANXIETY UP TO 10 A MONTH. Patient taking differently: Take 1 mg by mouth See admin instructions. Take 1 mg by mouth mid-morning, mid-afternoon, and at bedtime- may take an additional 1 mg once a day as needed for unresolved panic 04/30/24  Yes Mozingo, Regina Nattalie, NP  dexlansoprazole  (DEXILANT ) 60 MG capsule Take 1 capsule (60 mg  total) by mouth daily. 04/13/24  Yes Cirigliano, Vito V, DO  diclofenac  (VOLTAREN ) 50 MG EC tablet Take 50 mg by mouth 2 (two) times daily. Take 50 mg by mouth in the morning and an  additional 50 mg once a day as needed for migraines 07/23/23  Yes [provider]  diphenhydrAMINE  (BENADRYL ) 50 MG/ML injection Inject 25 mg into the vein 5 (five) times daily as needed (for nausea  (is 50 mg/ml)).   Yes [provider]  famotidine  (PEPCID ) 40 MG/5ML suspension Take 2.5 mLs (20 mg total) by mouth daily. 04/13/24 05/18/24 Yes Cirigliano, Vito V, DO  fluticasone -salmeterol (ADVAIR HFA) 230-21 MCG/ACT inhaler Inhale 2 puffs into the lungs 2 (two) times daily. Patient taking differently: Inhale 2 puffs into the lungs 2 (two) times daily as needed (for flares). 01/08/24  Yes Kassie Acquanetta Bradley, MD  ipratropium-albuterol  (DUONEB) 0.5-2.5 (3) MG/3ML SOLN Take 3 mLs by nebulization every 6 (six) hours as needed (for shortness of breath or wheezing).   Yes [provider]  albuterol  (PROVENTIL ) (2.5 MG/3ML) 0.083% nebulizer solution INHALE 3 ML BY NEBULIZATION EVERY 6 HOURS AS NEEDED FOR WHEEZING OR SHORTNESS OF BREATH Patient taking differently: Take 2.5 mg by nebulization every 6 (six) hours as needed for shortness of breath or wheezing. 12/13/23   Kassie Acquanetta Bradley, MD  amoxicillin -clavulanate (AUGMENTIN ) 875-125 MG tablet Take 1 tablet by mouth 2 (two) times daily. Patient not taking: Reported on 05/18/2024 02/26/24   Sherwood Drivers, PA-C  aspirin  EC 81 MG tablet Take 1 tablet (81 mg total) by mouth daily. Swallow whole. Patient taking differently: Take 81 mg by mouth at bedtime. Chewable 07/16/22   Nahser, Aleene PARAS, MD  Bismuth Tribromoph-Petrolatum (XEROFORM OCCLUSIVE GAUZE STRIP) PADS Apply 1 each topically as directed. 09/17/22   Tobie Yetta HERO, MD  diphenhydrAMINE  (BENADRYL ) 12.5 MG/5ML elixir Take 10 mLs (25 mg total) by mouth every 6 (six) hours as needed (nausea). Patient not taking:  Reported on 05/18/2024 09/17/22   Tobie Yetta HERO, MD  doxycycline  (VIBRAMYCIN ) 100 MG capsule Take 1 capsule (100 mg total) by mouth 2 (two) times daily. 03/25/24   Zackowski, Scott, MD  EMGALITY  120 MG/ML SOSY Inject 120 mg into the skin every 28 (twenty-eight) days.    [provider]  EPINEPHRINE  0.3 mg/0.3 mL IJ SOAJ injection INJECT 0.3 MG INTO THE MUSCLE AS NEEDED FOR ANAPHYLAXIS. 04/25/23   Kassie Acquanetta Bradley, MD  FLORASTOR 250 MG capsule Take 250 mg by mouth 2 (two) times daily. Mid Morning and Mid Afternoon    [provider]  FLUoxetine  (PROZAC ) 40 MG capsule Take 1 capsule (40 mg total) by mouth daily. 04/30/24   Mozingo, Regina Nattalie, NP  Heparin  Na, Pork, Lock Flsh PF (BD HEPARIN  POSIFLUSH) 100 UNIT/ML SOLN USE 5 MLS IN PORT A CATH ONCE DAILY AFTER MEDICATION ADMINISTRATION AS A HEPLOCK AS DIRECTED. 04/01/24     hydrocortisone  (CORTEF ) 10 MG tablet Take 1-2 tablets (10-20 mg total) by mouth See admin instructions. Take 20 mg in the am and 10mg  in the evening. Take after completing prednisone  Patient taking differently: Take 10-20 mg by mouth See admin instructions. Take 20 mg in the am at 10 am and 10 mg in the afternoon. Take after completing prednisone  09/29/22   Tobie Yetta HERO, MD  hyoscyamine  (LEVSIN AMIEL) 0.125 MG SL tablet Place 1 tablet (0.125 mg total) under the tongue every 6 (six) hours as needed (esophageal spasm). Up to 1.25 mg daily 12/22/23   Harris, Abigail, PA-C  lamoTRIgine  (LAMICTAL ) 200 MG tablet TAKE 1 TABLET BY MOUTH EVERYDAY AT BEDTIME 02/27/24   Mozingo, Regina Nattalie, NP  lamoTRIgine  (LAMICTAL ) 25 MG tablet Take two tablets every morning. 02/27/24   Mozingo, Regina  Nattalie, NP  Lancets (ONETOUCH DELICA PLUS LANCET33G) MISC 3 (three) times daily. for testing 06/12/21   [provider]  lidocaine  (XYLOCAINE ) 2 % solution Use as directed 10 mLs in the mouth or throat every 4 (four) hours as needed (for esophageal pain and pain with swallowing.).  12/22/23   Harris, Abigail, PA-C  LORazepam  (ATIVAN ) 0.5 MG tablet Take 1 tablet (0.5 mg total) by mouth 3 (three) times daily as needed for anxiety. 04/08/24   May, Deanna J, NP  nitroGLYCERIN  (NITROSTAT ) 0.4 MG SL tablet DISSOLVE 1 TAB UNDER THE TONGUE EVERY 5 MINUTES AS NEEDED FOR CHEST PAIN. MAX OF 3 DOSES, THEN 911. 07/03/23   Nahser, Aleene PARAS, MD  NURTEC 75 MG TBDP Take 1 tablet by mouth daily as needed. 09/03/23   [provider]  OLANZapine  (ZYPREXA ) 5 MG tablet Take 1 tablet (5 mg total) by mouth at bedtime. 02/27/24   Mozingo, Regina Nattalie, NP  [facility] VERIO test strip 3 (three) times daily. for testing 06/12/21   [provider]  OXYGEN  Inhale 4-5 L/min into the lungs continuous.    [provider]  potassium chloride  (KLOR-CON ) 10 MEQ tablet as needed. 12/01/23   [provider]  PROAIR  HFA 108 (90 Base) MCG/ACT inhaler Inhale 2 puffs into the lungs every 4 (four) hours as needed for wheezing or shortness of breath.    [provider]  QVAR  REDIHALER 80 MCG/ACT inhaler Inhale 1 puff into the lungs 2 (two) times daily. 05/10/23   [provider]  rosuvastatin  (CRESTOR ) 10 MG tablet Take 10 mg by mouth in the morning. Mid morning 02/28/18   [provider]  sacubitril -valsartan  (ENTRESTO ) 24-26 MG TAKE 1 TABLET BY MOUTH TWICE A DAY Patient taking differently: Take 1 tablet by mouth daily. 07/03/23   Nahser, Aleene PARAS, MD  sodium chloride  flush (BD POSIFLUSH) 0.9 % SOLN injection Use as directed for catheter maintenance 02/07/24     Sodium Chloride  Flush (NORMAL SALINE FLUSH) 0.9 % SOLN Use as directed for central venous catheter maintenance 01/31/23     Sodium Chloride  Flush (NORMAL SALINE FLUSH) 0.9 % SOLN Use as directed for catheter maintenance 11/07/23     Sodium Chloride  Flush (NORMAL SALINE FLUSH) 0.9 % SOLN Use 10 mLs as directed for catheter maintenance. 02/10/24     Sodium Chloride  Flush (NORMAL SALINE FLUSH) 0.9 % SOLN Use as directed  for catheter maintenance. 02/10/24     sucralfate  (CARAFATE ) 1 GM/10ML suspension Take 1 g by mouth daily as needed (as directed for ulcers).    [provider]  SYNTHROID  100 MCG tablet Take 1 tablet (100 mcg total) by mouth every morning. Patient taking differently: Take 100 mcg by mouth every morning. * Must be name brand* 10/19/21   Gudena, Vinay, MD  Tiotropium Bromide  Monohydrate (SPIRIVA  RESPIMAT) 1.25 MCG/ACT AERS Inhale 2 puffs into the lungs daily. 01/08/24   Kassie Acquanetta Bradley, MD  topiramate  (TOPAMAX ) 50 MG tablet Take 150 mg by mouth at bedtime. 12/25/19   [provider]  zolpidem  (AMBIEN  CR) 12.5 MG CR tablet TAKE ONE TABLET BY MOUTH AT BEDTIME AS NEEDED FOR SLEEP. 05/14/23       Physical Exam: Vitals:   05/18/24 1341 05/18/24 1700 05/18/24 1852 05/18/24 2000  BP: 135/76 135/88  132/77  Pulse: 63 (!) 105  96  Resp: 18 18  15   Temp: 98.7 F (37.1 C)  98.6 F (37 C) 98.5 F (36.9 C)  TempSrc: Oral  Oral Oral  SpO2: 97% 98%  98%   Exam:  Constitutional:  The patient is awake, alert, and oriented x 3. No acute distress. Eyes:  pupils and irises appear normal Normal lids and conjunctivae ENMT:  grossly normal hearing  Lips appear normal external ears, nose appear normal Oropharynx: mucosa, tongue,posterior pharynx appear normal Neck:  neck appears normal, no masses, normal ROM, supple no thyromegaly Respiratory:  Positive for increased work of breathing. Rales at bases bilaterally. No wheezes or rhonchi.  No tactile fremitus Cardiovascular:  Regular rate and rhythm No murmurs, ectopy, or gallups. No lateral PMI. No thrills. Abdomen:  Abdomen is soft, non-tender, non-distended No hernias, masses, or organomegaly Normoactive bowel sounds.  Musculoskeletal:  No cyanosis, clubbing, or edema Skin:  No rashes, lesions, ulcers palpation of skin: no induration or nodules Neurologic:  CN 2-12 intact Sensation all 4 extremities intact Psychiatric:   Mental status Mood, affect appropriate Orientation to person, place, time  judgment and insight appear intact  Data Reviewed:  CT chest CXR CBC BMP  Assessment and Plan: CAP (community acquired pneumonia) Due to pneumonia. Patient presented with leukocytosis, increased oxygen  requirements, and tachycardia. The patient is receiving IV ceftriaxone  and azithromycin .  Hypokalemia K of 3.0. Will supplement and monitor.  Grade I diastolic dysfunction Noted. Monitor volume status carefully.  Addison's disease (HCC) Patient is receiving stress dose hydrocortisone .  Acute on chronic respiratory failure with hypoxia (HCC) Patient is on 4L O2 at baseline. She has increased oxygen  requirements since presentation. She was receiving 8L when I was in the room.  Postablative hypothyroidism Noted. Continued synthroid .  CKD stage 3a, GFR 45-59 ml/min (HCC) Creatinine is at baseline at 1.35. Avoid nephrotoxic agents, monitor creatinine, electrolytes, and volume status.  Pulmonary arterial hypertension (HCC) Noted. Monitor.   Advance Care Planning:   Code Status: Full Code   Consults: None  Family Communication: Spouse is at bedside.  Severity of Illness: The appropriate patient status for this patient is INPATIENT. Inpatient status is judged to be reasonable and necessary in order to provide the required intensity of service to ensure the patient's safety. The patient's presenting symptoms, physical exam findings, and initial radiographic and laboratory data in the context of their chronic comorbidities is felt to place them at high risk for further clinical deterioration. Furthermore, it is not anticipated that the patient will be medically stable for discharge from the hospital within 2 midnights of admission.   * I certify that at the point of admission it is my clinical judgment that the patient will require inpatient hospital care spanning beyond 2 midnights from the point of  admission due to high intensity of service, high risk for further deterioration and high frequency of surveillance required.*  Author: Cambri Plourde, DO 05/18/2024 10:41 PM  For on call review [email or web address].

## 2024-05-19 ENCOUNTER — Ambulatory Visit: Admitting: Gastroenterology

## 2024-05-19 ENCOUNTER — Telehealth: Payer: Self-pay | Admitting: Gastroenterology

## 2024-05-19 DIAGNOSIS — J9621 Acute and chronic respiratory failure with hypoxia: Secondary | ICD-10-CM

## 2024-05-19 DIAGNOSIS — E271 Primary adrenocortical insufficiency: Secondary | ICD-10-CM

## 2024-05-19 DIAGNOSIS — F411 Generalized anxiety disorder: Secondary | ICD-10-CM

## 2024-05-19 DIAGNOSIS — F419 Anxiety disorder, unspecified: Secondary | ICD-10-CM | POA: Diagnosis not present

## 2024-05-19 DIAGNOSIS — J189 Pneumonia, unspecified organism: Secondary | ICD-10-CM | POA: Diagnosis not present

## 2024-05-19 DIAGNOSIS — F32A Depression, unspecified: Secondary | ICD-10-CM

## 2024-05-19 DIAGNOSIS — I5189 Other ill-defined heart diseases: Secondary | ICD-10-CM

## 2024-05-19 DIAGNOSIS — E890 Postprocedural hypothyroidism: Secondary | ICD-10-CM

## 2024-05-19 DIAGNOSIS — N1831 Chronic kidney disease, stage 3a: Secondary | ICD-10-CM

## 2024-05-19 DIAGNOSIS — E876 Hypokalemia: Secondary | ICD-10-CM

## 2024-05-19 DIAGNOSIS — I2721 Secondary pulmonary arterial hypertension: Secondary | ICD-10-CM

## 2024-05-19 LAB — RESPIRATORY PANEL BY PCR

## 2024-05-19 LAB — BASIC METABOLIC PANEL WITH GFR
Anion gap: 13 (ref 5–15)
BUN: 25 mg/dL — ABNORMAL HIGH (ref 6–20)
CO2: 22 mmol/L (ref 22–32)
Calcium: 8.4 mg/dL — ABNORMAL LOW (ref 8.9–10.3)
Chloride: 102 mmol/L (ref 98–111)
Creatinine, Ser: 1.32 mg/dL — ABNORMAL HIGH (ref 0.44–1.00)
GFR, Estimated: 48 mL/min — ABNORMAL LOW (ref >60)
Glucose, Bld: 227 mg/dL — ABNORMAL HIGH (ref 70–99)
Potassium: 3.3 mmol/L — ABNORMAL LOW (ref 3.5–5.1)
Sodium: 137 mmol/L (ref 135–145)

## 2024-05-19 LAB — CBC
HCT: 24.6 % — ABNORMAL LOW (ref 36.0–46.0)
HCT: 24.3 % — ABNORMAL LOW (ref 36.0–46.0)
Hemoglobin: 7 g/dL — ABNORMAL LOW (ref 12.0–15.0)
Hemoglobin: 6.8 g/dL — CL (ref 12.0–15.0)
MCH: 21.3 pg — ABNORMAL LOW (ref 26.0–34.0)
MCH: 21.4 pg — ABNORMAL LOW (ref 26.0–34.0)
MCHC: 28.5 g/dL — ABNORMAL LOW (ref 30.0–36.0)
MCHC: 28 g/dL — ABNORMAL LOW (ref 30.0–36.0)
MCV: 75 fL — ABNORMAL LOW (ref 80.0–100.0)
MCV: 76.4 fL — ABNORMAL LOW (ref 80.0–100.0)
Platelets: 312 K/uL (ref 150–400)
Platelets: 301 K/uL (ref 150–400)
RBC: 3.28 MIL/uL — ABNORMAL LOW (ref 3.87–5.11)
RBC: 3.18 MIL/uL — ABNORMAL LOW (ref 3.87–5.11)
RDW: 17.6 % — ABNORMAL HIGH (ref 11.5–15.5)
RDW: 17.8 % — ABNORMAL HIGH (ref 11.5–15.5)
WBC: 15 K/uL — ABNORMAL HIGH (ref 4.0–10.5)
WBC: 14.3 K/uL — ABNORMAL HIGH (ref 4.0–10.5)
nRBC: 0 % (ref 0.0–0.2)
nRBC: 0 % (ref 0.0–0.2)

## 2024-05-19 LAB — EXPECTORATED SPUTUM ASSESSMENT W GRAM STAIN, RFLX TO RESP C

## 2024-05-19 LAB — PREPARE RBC (CROSSMATCH)

## 2024-05-19 LAB — IRON AND TIBC
Iron: 14 ug/dL — ABNORMAL LOW (ref 28–170)
Saturation Ratios: 3 % — ABNORMAL LOW (ref 10.4–31.8)
TIBC: 412 ug/dL (ref 250–450)
UIBC: 398 ug/dL

## 2024-05-19 LAB — CREATININE, SERUM
Creatinine, Ser: 1.17 mg/dL — ABNORMAL HIGH (ref 0.44–1.00)
GFR, Estimated: 56 mL/min — ABNORMAL LOW (ref >60)

## 2024-05-19 LAB — HIV ANTIBODY (ROUTINE TESTING W REFLEX): HIV Screen 4th Generation wRfx: NONREACTIVE

## 2024-05-19 LAB — HEMOGLOBIN AND HEMATOCRIT, BLOOD
HCT: 25.7 % — ABNORMAL LOW (ref 36.0–46.0)
Hemoglobin: 7.6 g/dL — ABNORMAL LOW (ref 12.0–15.0)

## 2024-05-19 LAB — RENAL FUNCTION PANEL
Albumin: 3.2 g/dL — ABNORMAL LOW (ref 3.5–5.0)
Anion gap: 13 (ref 5–15)
BUN: 25 mg/dL — ABNORMAL HIGH (ref 6–20)
CO2: 22 mmol/L (ref 22–32)
Calcium: 8.4 mg/dL — ABNORMAL LOW (ref 8.9–10.3)
Chloride: 102 mmol/L (ref 98–111)
Creatinine, Ser: 1.32 mg/dL — ABNORMAL HIGH (ref 0.44–1.00)
GFR, Estimated: 48 mL/min — ABNORMAL LOW (ref >60)
Glucose, Bld: 231 mg/dL — ABNORMAL HIGH (ref 70–99)
Phosphorus: 2.1 mg/dL — ABNORMAL LOW (ref 2.5–4.6)
Potassium: 3.3 mmol/L — ABNORMAL LOW (ref 3.5–5.1)
Sodium: 137 mmol/L (ref 135–145)

## 2024-05-19 LAB — FOLATE: Folate: 14.5 ng/mL (ref >5.9)

## 2024-05-19 LAB — MAGNESIUM: Magnesium: 2 mg/dL (ref 1.7–2.4)

## 2024-05-19 LAB — STREP PNEUMONIAE URINARY ANTIGEN: Strep Pneumo Urinary Antigen: NEGATIVE

## 2024-05-19 LAB — TROPONIN I (HIGH SENSITIVITY): Troponin I (High Sensitivity): 9 ng/L (ref <18)

## 2024-05-19 LAB — VITAMIN B12: Vitamin B-12: 414 pg/mL (ref 180–914)

## 2024-05-19 LAB — FERRITIN: Ferritin: 35 ng/mL (ref 11–307)

## 2024-05-19 MED ORDER — FUROSEMIDE 10 MG/ML IJ SOLN
20.0000 mg | Freq: Once | INTRAMUSCULAR | Status: AC
  Administered 2024-05-20: 20 mg via INTRAVENOUS
  Filled 2024-05-19: qty 2

## 2024-05-19 MED ORDER — SODIUM CHLORIDE 0.9% IV SOLUTION: Freq: Once | INTRAVENOUS | Status: AC

## 2024-05-19 MED ORDER — BUDESONIDE 0.5 MG/2ML IN SUSP
0.5000 mg | Freq: Two times a day (BID) | RESPIRATORY_TRACT | Status: DC
  Administered 2024-05-19 – 2024-05-22 (×7): 0.5 mg via RESPIRATORY_TRACT
  Filled 2024-05-19 (×7): qty 2

## 2024-05-19 MED ORDER — SODIUM CHLORIDE 0.9% FLUSH
10.0000 mL | INTRAVENOUS | Status: DC | PRN
  Administered 2024-05-19 (×2): 10 mL

## 2024-05-19 MED ORDER — DIPHENHYDRAMINE HCL 25 MG PO CAPS
25.0000 mg | ORAL_CAPSULE | Freq: Once | ORAL | Status: DC
  Filled 2024-05-19: qty 1

## 2024-05-19 MED ORDER — IPRATROPIUM-ALBUTEROL 0.5-2.5 (3) MG/3ML IN SOLN
3.0000 mL | Freq: Three times a day (TID) | RESPIRATORY_TRACT | Status: DC
  Administered 2024-05-19 – 2024-05-22 (×10): 3 mL via RESPIRATORY_TRACT
  Filled 2024-05-19 (×11): qty 3

## 2024-05-19 MED ORDER — ACETAMINOPHEN 325 MG PO TABS
650.0000 mg | ORAL_TABLET | Freq: Once | ORAL | Status: AC
  Administered 2024-05-19: 650 mg via ORAL
  Filled 2024-05-19: qty 2

## 2024-05-19 MED ORDER — CHLORHEXIDINE GLUCONATE CLOTH 2 % EX PADS
6.0000 | MEDICATED_PAD | Freq: Every day | CUTANEOUS | Status: DC
  Administered 2024-05-19 – 2024-05-22 (×4): 6 via TOPICAL

## 2024-05-19 MED ORDER — SODIUM CHLORIDE 0.9% FLUSH
10.0000 mL | Freq: Two times a day (BID) | INTRAVENOUS | Status: DC
  Administered 2024-05-19 – 2024-05-22 (×5): 10 mL

## 2024-05-19 MED ORDER — DIPHENHYDRAMINE HCL 50 MG/ML IJ SOLN
12.5000 mg | Freq: Once | INTRAMUSCULAR | Status: AC
  Administered 2024-05-19: 12.5 mg via INTRAVENOUS
  Filled 2024-05-19: qty 1

## 2024-05-19 MED ORDER — LORATADINE 10 MG PO TABS
10.0000 mg | ORAL_TABLET | Freq: Every day | ORAL | Status: DC
  Administered 2024-05-19 – 2024-05-22 (×4): 10 mg via ORAL
  Filled 2024-05-19 (×4): qty 1

## 2024-05-19 MED ORDER — SUMATRIPTAN SUCCINATE 25 MG PO TABS
25.0000 mg | ORAL_TABLET | ORAL | Status: DC | PRN
  Filled 2024-05-19: qty 1

## 2024-05-19 MED ORDER — DIPHENHYDRAMINE HCL 50 MG/ML IJ SOLN
25.0000 mg | Freq: Four times a day (QID) | INTRAMUSCULAR | Status: DC | PRN
  Administered 2024-05-19 – 2024-05-22 (×10): 25 mg via INTRAVENOUS
  Filled 2024-05-19 (×10): qty 1

## 2024-05-19 MED ORDER — UMECLIDINIUM BROMIDE 62.5 MCG/ACT IN AEPB
1.0000 | INHALATION_SPRAY | Freq: Every day | RESPIRATORY_TRACT | Status: DC
  Filled 2024-05-19: qty 7

## 2024-05-19 MED ORDER — FLUTICASONE PROPIONATE 50 MCG/ACT NA SUSP
2.0000 | Freq: Every day | NASAL | Status: DC
  Administered 2024-05-19 – 2024-05-20 (×2): 2 via NASAL
  Filled 2024-05-19: qty 16

## 2024-05-19 NOTE — Plan of Care (Signed)
   Problem: Education: Goal: Knowledge of General Education information will improve Description Including pain rating scale, medication(s)/side effects and non-pharmacologic comfort measures Outcome: Progressing   Problem: Health Behavior/Discharge Planning: Goal: Ability to manage health-related needs will improve Outcome: Progressing

## 2024-05-19 NOTE — Progress Notes (Signed)
 Blood transfusion completed in EPIC prior to entering rate change at post-15 minute start time. Rate was changed from to .   Pt received a total of 263mL PRBC.

## 2024-05-19 NOTE — Consult Note (Addendum)
 WOC Nurse Consult Note: Reason for Consult: Assess wound on dorsal R foot. Wound type: trauma? Pressure Injury POA: NA Measurement: aprox. 3 x 3 cm. Wound bed: 100% black and dry eschar. Drainage (amount, consistency, odor) NONE Periwound: intact, roller edges. Dressing procedure/placement/frequency: Cleanse with Vashe [id number], pat dry the periwound skin, clean the edges using a swab moist with Vashe daily, separating as much as you can the viable skin to the eschar. Cover with foam dressing. Change every 3 days or PRN.  WOC team will not plan to follow further. Please reconsult if further assistance is needed. Thank-you,  Lela Holm RN, CNS, [employer], MSN.  (Phone [phone])

## 2024-05-19 NOTE — TOC Initial Note (Signed)
 Transition of Care [facility]) - Initial/Assessment Note    Patient Details  Name: [name] MRN: [id number] Date of Birth: 21-Jun-1971  [facility]) CM/SW Contact:    Alfonse JONELLE Rex, RN Phone Number: 05/19/2024, 1:45 PM  Clinical Narrative: Patient has PCP on file,  resides in private residence with spouse, on home 02 at 4L.  TOC will follow for dc needs.                    Expected Discharge Plan: Home w Home Health Services Barriers to Discharge: Continued Medical Work up   Patient Goals and CMS Choice Patient states their goals for this hospitalization and ongoing recovery are:: return home          Expected Discharge Plan and Services       Living arrangements for the past 2 months: Single Family Home                                      Prior Living Arrangements/Services Living arrangements for the past 2 months: Single Family Home Lives with:: Spouse Patient language and need for interpreter reviewed:: Yes Do you feel safe going back to the place where you live?: Yes      Need for Family Participation in Patient Care: Yes (Comment) Care giver support system in place?: Yes (comment)   Criminal Activity/Legal Involvement Pertinent to Current Situation/Hospitalization: No - Comment as needed  Activities of Daily Living   ADL Screening (condition at time of admission) Independently performs ADLs?: Yes (appropriate for developmental age) Is the patient deaf or have difficulty hearing?: No Does the patient have difficulty seeing, even when wearing glasses/contacts?: No Does the patient have difficulty concentrating, remembering, or making decisions?: No  Permission Sought/Granted                  Emotional Assessment       Orientation: : Oriented to Self, Oriented to Place, Oriented to  Time, Oriented to Situation Alcohol / Substance Use: Not Applicable Psych Involvement: No (comment)  Admission diagnosis:  Pneumonia  [J18.9] Community acquired pneumonia, unspecified laterality [J18.9] Patient Active Problem List   Diagnosis Date Noted   Acute on chronic respiratory failure with hypoxia (HCC) 05/18/2024   Pulmonary arterial hypertension (HCC) 05/18/2024   Pneumonia 05/18/2024   Hiatal hernia 04/13/2024   Hematemesis with nausea 04/13/2024   Nausea and vomiting 04/13/2024   Fundic gland polyps of stomach, benign 04/13/2024   Benign esophageal stricture 02/11/2024   Gastroesophageal reflux disease with esophagitis 01/07/2024   Esophageal candidiasis (HCC) 01/07/2024   Esophageal dysphagia 12/26/2023   Odynophagia 12/26/2023   Gastric polyps 12/26/2023   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   AKI (acute kidney injury) (HCC) 04/23/2023   Adrenal crisis (HCC) 04/22/2023   Severe persistent asthma 03/09/2023   CKD stage 3a, GFR 45-59 ml/min (HCC) 03/09/2023   Goals of care, counseling/discussion 01/22/2023   Anemia 01/22/2023   Cellulitis 09/13/2022   Grade I diastolic dysfunction 09/13/2022   Excessive daytime sleepiness 08/01/2022   Anxiety    Anxiety and depression 04/06/2022   GERD without esophagitis 04/06/2022   CAP (community acquired pneumonia) 04/06/2022   PICC (peripherally inserted central catheter) in place    Bacteremia due to Enterobacter species 03/29/2022   Normocytic anemia 03/29/2022   Major depressive disorder, recurrent episode, moderate (HCC)    Generalized anxiety disorder/depression  Seizure-like activity (HCC) 12/08/2020   High anion gap metabolic acidosis 12/08/2020   Hypokalemia 12/08/2020   Acute renal failure superimposed on stage 3a chronic kidney disease (HCC) 04/27/2020   Vasculopathy 04/27/2020   Bacteremia 04/19/2020   Addison's disease (HCC)    Chronic nausea 04/01/2020   Generalized weakness 02/10/2020   Headache 02/10/2020   Subcutaneous nodule 02/01/2020   Port-A-Cath in place 09/09/2019   Superficial thrombophlebitis 07/16/2019   Injury of left index finger  12/24/2018   History of pituitary adenoma 12/23/2018   History of cervical cancer 12/23/2018   SOB (shortness of breath)    Nephrolithiasis 11/07/2018   Migraine 11/07/2018   PVC's (premature ventricular contractions) 10/27/2018   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   Hyperlipidemia 03/09/2014   Hx of Hodgkins lymphoma    History of ductal carcinoma in situ (DCIS) of breast    History of thyroid  cancer s/p thyroidectomy and post surgical hypothyroidism    Raynaud phenomenon    PCP:  Naaman Cy HERO, MD Pharmacy:   [id number] [address], [place] - [address] [place] [address] Phone: [phone] Fax: [phone]     Social Drivers of Health (SDOH) Social History: SDOH Screenings   Food Insecurity: No Food Insecurity (05/19/2024)  Housing: Low Risk  (05/19/2024)  Transportation Needs: No Transportation Needs (05/19/2024)  Utilities: Not At Risk (05/19/2024)  Depression (PHQ2-9): Low Risk  (06/14/2022)  Financial Resource Strain: Medium Risk (08/21/2017)   Received from [facility]  Physical Activity: Unknown (08/21/2017)   Received from [facility]  Social Connections: Unknown (02/12/2022)   Received from [facility]  Stress: Stress Concern Present (08/21/2017)   Received from [facility]  Tobacco Use: Low Risk  (05/18/2024)   SDOH Interventions:     Readmission Risk Interventions    05/19/2024    1:45 PM 03/12/2023    9:36 PM 09/14/2022   12:19 PM  Readmission Risk Prevention Plan  Transportation Screening Complete Complete Complete  Medication Review (RN Care Manager) Complete Complete Complete  PCP or Specialist appointment within 3-5 days of discharge Complete Complete Complete  HRI or Home Care Consult Complete Complete Complete  SW Recovery Care/Counseling Consult Complete Complete Complete  Palliative Care Screening Not Applicable Not  Applicable Not Applicable  Skilled Nursing Facility Not Applicable Not Applicable Not Applicable

## 2024-05-19 NOTE — Plan of Care (Signed)

## 2024-05-19 NOTE — Progress Notes (Signed)
 IV team at bedside for lab draw. IV nurse assessed pt's Picc line and noticed pt's dressing was soiled. Nurse then asked pt when was the last time her Picc dressing was changed? Pt responded 2 weeks ago and that her statlock hasn't been changed since her Picc was placed in July, because her home health nurse ran out of statlocks. While removing soiled dressing, IV nurse also noticed pt's transparent dressing still had the removable backing intact and placed to the Picc site with another transparent dressing overlapping. Pt's Picc dressing cleansed and dressed under sterile technique. Pt educated on sterile dressing changes. Pt stated she couldn't remember the name of her home health agency.

## 2024-05-19 NOTE — Telephone Encounter (Signed)
 Patient called and stated that his wife was admitted to the hospital and will not be able to come to her visit today. Patient husband is requesting a call back. please advise.

## 2024-05-19 NOTE — Progress Notes (Signed)
 PROGRESS NOTE    [name]  FMW:[id number] DOB: 04/15/1971 DOA: 05/18/2024 PCP: Toppin, Jean M, MD    Chief Complaint  Patient presents with   Shortness of Breath    Brief Narrative:  Patient 53 year old female history of Hodgkin's lymphoma, pulmonary artery hypertension, CHF, Addison's disease, migraines presenting with a 2-day history of fevers chills cough sputum production and shortness of breath.  Noted to have increased O2 requirements of 8 L nasal cannula on admission.  Imaging done concerning for multifocal pneumonia.  Patient admitted and placed empirically on IV antibiotics.   Assessment & Plan:   Principal Problem:   Acute on chronic respiratory failure with hypoxia (HCC) Active Problems:   CAP (community acquired pneumonia)   Hypokalemia   Grade I diastolic dysfunction   Addison's disease (HCC)   Generalized anxiety disorder/depression   Hx of Hodgkins lymphoma   Anxiety and depression   Postablative hypothyroidism   CKD stage 3a, GFR 45-59 ml/min (HCC)   Pulmonary arterial hypertension (HCC)   Pneumonia   Hypophosphatemia  #1 acute on chronic respiratory failure secondary to multifocal community-acquired pneumonia/rhinovirus/enterovirus infection - Patient presented with respiratory symptoms, noted to have a leukocytosis, increased O2 requirements from baseline. - Chest x-ray done on admission with interval increase in ground glass opacities involving bilateral perihilar and lower lobes which may represent edema versus airspace consolidation. - CT chest done with multifocal bilateral airspace disease most pronounced within the lower lobes appearance most consistent with multifocal bilateral bronchopneumonia.  Borderline enlarged mediastinal lymph nodes likely reactive. - Patient with some improvement with hypoxia and down to 5 L nasal cannula which she states is her baseline. - Patient however states no significant clinical improvement. - Sputum Gram stain  and culture pending. -  SARS coronavirus 2 PCR negative, - Influenza A and B by PCR negative. - RSV by PCR negative. - Respiratory viral panel was ordered this morning which is positive for the rhinovirus. - Continue empiric IV antibiotics of cefepime  and azithromycin . - Check a procalcitonin levels and if not elevated could likely downgrade antibiotics. - Discontinue Qvar , Breo elliptica, Incruse. - Place on Pulmicort , scheduled DuoNebs, Claritin , Flonase , PPI. - Increase Mucinex  to 1200 mg twice daily. - Supportive care.  2.  Hypokalemia/hypophosphatemia -Magnesium  2.0. - Potassium 3.3 - Phosphorus of 2.1. - Place on K-Phos to 50 mg twice daily. - KDur 40 mg p.o. every 4 hours x 2 doses.  3.  Anemia -Patient with no overt bleeding. - Checking anemia panel. -Check FOBT. - Transfused 2 units PRBCs.  4.  Grade 1 diastolic dysfunction -Continue home regimen Bumex .  5.  Addison's disease -Continue stress dose hydrocortisone .  6.  Postablative hypothyroidism -Continue Synthroid .  7.  CKD stage IIIa -Baseline creatinine 1.35. - Avoid nephrotoxins.  8.  Pulmonary artery hypertension -Outpatient follow-up.  9.  Depression/anxiety -Continue home regimen Zyprexa , Lamictal , Prozac , Wellbutrin .  10.  GERD -PPI.   DVT prophylaxis: Lovenox  Code Status: Full Family Communication: Updated patient and her husband at bedside. Disposition: Likely home when clinically improved.  Status is: Inpatient Remains inpatient appropriate because: Severity of illness   Consultants:  None  Procedures: Transfusion 2 units PRBCs 05/19/2024 CT chest 05/18/2024 Chest x-ray 05/18/2024   Antimicrobials:  Anti-infectives (From admission, onward)    Start     Dose/Rate Route Frequency Ordered Stop   05/19/24 1000  cefTRIAXone  (ROCEPHIN ) 2 g in sodium chloride  0.9 % 100 mL IVPB  Status:  Discontinued  2 g 200 mL/hr over 30 Minutes Intravenous Daily 05/18/24 1723 05/18/24 2257    05/19/24 1000  azithromycin  (ZITHROMAX ) 500 mg in sodium chloride  0.9 % 250 mL IVPB  Status:  Discontinued        500 mg 250 mL/hr over 60 Minutes Intravenous Daily 05/18/24 1738 05/18/24 2257   05/19/24 1000  azithromycin  (ZITHROMAX ) 500 mg in sodium chloride  0.9 % 250 mL IVPB        500 mg 250 mL/hr over 60 Minutes Intravenous Every 24 hours 05/18/24 2239 05/24/24 0959   05/19/24 0000  ceFEPIme  (MAXIPIME ) 2 g in sodium chloride  0.9 % 100 mL IVPB        2 g 200 mL/hr over 30 Minutes Intravenous Every 12 hours 05/18/24 2239 05/23/24 2359   05/18/24 1545  cefTRIAXone  (ROCEPHIN ) 1 g in sodium chloride  0.9 % 100 mL IVPB        1 g 200 mL/hr over 30 Minutes Intravenous  Once 05/18/24 1531 05/18/24 1756   05/18/24 1545  azithromycin  (ZITHROMAX ) 500 mg in sodium chloride  0.9 % 250 mL IVPB        500 mg 250 mL/hr over 60 Minutes Intravenous  Once 05/18/24 1531 05/18/24 1712         Subjective: Patient lying in bed.  States no significant change with shortness of breath and cough.  Husband at bedside.  Patient states chronically on 4-5 L nasal cannula at home.  Objective: Vitals:   05/19/24 1400 05/19/24 1410 05/19/24 1417 05/19/24 1452  BP: 113/77 113/77 113/77   Pulse: 88 88 87   Resp: 17  18   Temp: 98 F (36.7 C) 98 F (36.7 C) 98 F (36.7 C)   TempSrc: Oral Oral Oral   SpO2:  100% 100% 100%  Weight:      Height:        Intake/Output Summary (Last 24 hours) at 05/19/2024 1915 Last data filed at 05/19/2024 1800 Gross per 24 hour  Intake 1682.91 ml  Output 1450 ml  Net 232.91 ml   Filed Weights   05/18/24 2200 05/19/24 0136  Weight: 77.6 kg 76.2 kg    Examination:  General exam: Appears calm and comfortable  Respiratory system: Coarse breath sounds diffusely. No significant wheezing noted.  No crackles.  Fair air movement.  Speaking in full sentences.  No use of accessory muscles of respiration.   Cardiovascular system: S1 & S2 heard, RRR. No JVD, murmurs, rubs, gallops  or clicks. No pedal edema. Gastrointestinal system: Abdomen is nondistended, soft and nontender. No organomegaly or masses felt. Normal bowel sounds heard. Central nervous system: Alert and oriented. No focal neurological deficits. Extremities: Symmetric 5 x 5 power. Skin: Eschar noted on dorsum of right foot.  Psychiatry: Judgement and insight appear normal. Mood & affect appropriate.     Data Reviewed: I have personally reviewed following labs and imaging studies  CBC: Recent Labs  Lab 05/18/24 1405 05/19/24 0108 05/19/24 0850 05/19/24 1633  WBC 16.7* 15.0* 14.3*  --   NEUTROABS 14.0*  --   --   --   HGB 7.9* 7.0* 6.8* 7.6*  HCT 27.2* 24.6* 24.3* 25.7*  MCV 74.7* 75.0* 76.4*  --   PLT 365 312 301  --     Basic Metabolic Panel: Recent Labs  Lab 05/18/24 1405 05/19/24 0108 05/19/24 0850  NA 139  --  137  137  K 3.0*  --  3.3*  3.3*  CL 105  --  102  102  CO2 24  --  22  22  GLUCOSE 121*  --  231*  227*  BUN 28*  --  25*  25*  CREATININE 1.35* 1.17* 1.32*  1.32*  CALCIUM  9.1  --  8.4*  8.4*  MG 1.9  --  2.0  PHOS  --   --  2.1*    GFR: Estimated Creatinine Clearance: 50.3 mL/min (A) (by C-G formula based on SCr of 1.32 mg/dL (H)).  Liver Function Tests: Recent Labs  Lab 05/18/24 1405 05/19/24 0850  AST 19  --   ALT 19  --   ALKPHOS 50  --   BILITOT 0.6  --   PROT 6.9  --   ALBUMIN 3.7 3.2*    CBG: No results for input(s): GLUCAP in the last 168 hours.   Recent Results (from the past 240 hours)  Respiratory (~20 pathogens) panel by PCR     Status: Abnormal   Collection Time: 05/18/24  9:42 AM   Specimen: Nasopharyngeal Swab; Respiratory  Result Value Ref Range Status   Adenovirus NOT DETECTED NOT DETECTED Final   Coronavirus 229E NOT DETECTED NOT DETECTED Final    Comment: (NOTE) The Coronavirus on the Respiratory Panel, DOES NOT test for the novel  Coronavirus (2019 nCoV)    Coronavirus HKU1 NOT DETECTED NOT DETECTED Final    Coronavirus NL63 NOT DETECTED NOT DETECTED Final   Coronavirus OC43 NOT DETECTED NOT DETECTED Final   Metapneumovirus NOT DETECTED NOT DETECTED Final   Rhinovirus / Enterovirus DETECTED (A) NOT DETECTED Final   Influenza A NOT DETECTED NOT DETECTED Final   Influenza B NOT DETECTED NOT DETECTED Final   Parainfluenza Virus 1 NOT DETECTED NOT DETECTED Final   Parainfluenza Virus 2 NOT DETECTED NOT DETECTED Final   Parainfluenza Virus 3 NOT DETECTED NOT DETECTED Final   Parainfluenza Virus 4 NOT DETECTED NOT DETECTED Final   Respiratory Syncytial Virus NOT DETECTED NOT DETECTED Final   Bordetella pertussis NOT DETECTED NOT DETECTED Final   Bordetella Parapertussis NOT DETECTED NOT DETECTED Final   Chlamydophila pneumoniae NOT DETECTED NOT DETECTED Final   Mycoplasma pneumoniae NOT DETECTED NOT DETECTED Final    Comment: Performed at [facility], [address] [place], [place] [address]  Resp panel by RT-PCR (RSV, Flu A&B, Covid) Anterior Nasal Swab     Status: None   Collection Time: 05/18/24  2:05 PM   Specimen: Anterior Nasal Swab  Result Value Ref Range Status   SARS Coronavirus 2 by RT PCR NEGATIVE NEGATIVE Final    Comment: (NOTE) SARS-CoV-2 target nucleic acids are NOT DETECTED.  The SARS-CoV-2 RNA is generally detectable in upper respiratory specimens during the acute phase of infection. The lowest concentration of SARS-CoV-2 viral copies this assay can detect is 138 copies/mL. A negative result does not preclude SARS-Cov-2 infection and should not be used as the sole basis for treatment or other patient management decisions. A negative result may occur with  improper specimen collection/handling, submission of specimen other than nasopharyngeal swab, presence of viral mutation(s) within the areas targeted by this assay, and inadequate number of viral copies(<138 copies/mL). A negative result must be combined with clinical observations, patient history, and  epidemiological information. The expected result is Negative.  Fact Sheet for Patients:  BloggerCourse.com  Fact Sheet for Healthcare Providers:  SeriousBroker.it  This test is no t yet approved or cleared by the [place]  FDA and  has been authorized for detection and/or diagnosis of SARS-CoV-2  by FDA under an Emergency Use Authorization (EUA). This EUA will remain  in effect (meaning this test can be used) for the duration of the COVID-19 declaration under Section 564(b)(1) of the Act, 21 U.S.C.section 360bbb-3(b)(1), unless the authorization is terminated  or revoked sooner.       Influenza A by PCR NEGATIVE NEGATIVE Final   Influenza B by PCR NEGATIVE NEGATIVE Final    Comment: (NOTE) The Xpert Xpress SARS-CoV-2/FLU/RSV plus assay is intended as an aid in the diagnosis of influenza from Nasopharyngeal swab specimens and should not be used as a sole basis for treatment. Nasal washings and aspirates are unacceptable for Xpert Xpress SARS-CoV-2/FLU/RSV testing.  Fact Sheet for Patients: BloggerCourse.com  Fact Sheet for Healthcare Providers: SeriousBroker.it  This test is not yet approved or cleared by the [place]  FDA and has been authorized for detection and/or diagnosis of SARS-CoV-2 by FDA under an Emergency Use Authorization (EUA). This EUA will remain in effect (meaning this test can be used) for the duration of the COVID-19 declaration under Section 564(b)(1) of the Act, 21 U.S.C. section 360bbb-3(b)(1), unless the authorization is terminated or revoked.     Resp Syncytial Virus by PCR NEGATIVE NEGATIVE Final    Comment: (NOTE) Fact Sheet for Patients: BloggerCourse.com  Fact Sheet for Healthcare Providers: SeriousBroker.it  This test is not yet approved or cleared by the [place]  FDA and has been  authorized for detection and/or diagnosis of SARS-CoV-2 by FDA under an Emergency Use Authorization (EUA). This EUA will remain in effect (meaning this test can be used) for the duration of the COVID-19 declaration under Section 564(b)(1) of the Act, 21 U.S.C. section 360bbb-3(b)(1), unless the authorization is terminated or revoked.  Performed at [facility], [address] [place], [place] [address]   Group A Strep by PCR     Status: None   Collection Time: 05/18/24  5:21 PM   Specimen: Throat; Sterile Swab  Result Value Ref Range Status   Group A Strep by PCR NOT DETECTED NOT DETECTED Final    Comment: Performed at [facility], [address], [place], [place] [address]  Expectorated Sputum Assessment w Gram Stain, Rflx to Resp Cult     Status: None   Collection Time: 05/19/24  9:13 AM   Specimen: Sputum  Result Value Ref Range Status   Specimen Description SPUTUM  Final   Special Requests NONE  Final   Sputum evaluation   Final    Sputum specimen not acceptable for testing.  Please recollect.   NOTIFIED BROCKS E. Performed at [facility], [address], [place], [place] [address]    Report Status 05/19/2024 FINAL  Final  Expectorated Sputum Assessment w Gram Stain, Rflx to Resp Cult     Status: None   Collection Time: 05/19/24 11:47 AM   Specimen: Expectorated Sputum  Result Value Ref Range Status   Specimen Description EXPECTORATED SPUTUM  Final   Special Requests NONE  Final   Sputum evaluation   Final    THIS SPECIMEN IS ACCEPTABLE FOR SPUTUM CULTURE Performed at [facility], [address], [place], [place] [address]    Report Status 05/19/2024 FINAL  Final         Radiology Studies: CT Chest W Contrast Result Date: 05/18/2024 CLINICAL DATA:  Increased shortness of breath since yesterday, chest pain, cough EXAM: CT CHEST WITH CONTRAST TECHNIQUE: Multidetector CT imaging of the chest  was performed during intravenous  contrast administration. RADIATION DOSE REDUCTION: This exam was performed according to the departmental dose-optimization program which includes automated exposure control, adjustment of the mA and/or kV according to patient size and/or use of iterative reconstruction technique. CONTRAST:  75mL OMNIPAQUE  IOHEXOL  300 MG/ML  SOLN COMPARISON:  05/18/2024, 01/23/2023 FINDINGS: Cardiovascular: The heart is unremarkable without pericardial effusion. Calcifications of the aortic valve are noted. No evidence of thoracic aortic aneurysm or dissection. Atherosclerosis of the aorta and coronary vasculature. Mediastinum/Nodes: Borderline enlarged mediastinal lymph nodes, measuring up to 11 mm in the right paratracheal region and 10 mm in the subcarinal region. Trachea and esophagus are unremarkable. Lungs/Pleura: Multifocal bilateral airspace disease within the right upper lobe and most pronounced within the bilateral lower lobes. No effusion or pneumothorax. The central airways are patent. Upper Abdomen: No acute abnormality.  Bilateral breast prostheses. Musculoskeletal: Left-sided PICC tip within the superior vena cava. No acute or destructive bony abnormalities. Reconstructed images demonstrate no additional findings. IMPRESSION: 1. Multifocal bilateral airspace disease, most pronounced within the lower lobes, with an appearance most consistent with multifocal bilateral bronchopneumonia. 2. Borderline enlarged mediastinal lymph nodes, likely reactive. 3. Aortic Atherosclerosis (ICD10-I70.0). Coronary artery atherosclerosis. Electronically Signed   By: Ozell Daring M.D.   On: 05/18/2024 18:57   DG Chest 2 View Result Date: 05/18/2024 CLINICAL DATA:  Chest pain and worsening cough. EXAM: CHEST - 2 VIEW COMPARISON:  Chest radiograph December 22, 2023. FINDINGS: Interval increase in bilateral ground-glass opacities in predominantly involving bilateral perihilar and lower hemi thoraces. Left  PICC line tip terminates in distal SVC. Interval removal of right central line. The heart size is normal. Bilateral surgical clips identified in right breast/axilla. Trace pleural effusion on the right. No acute osseous lesion. IMPRESSION: Interval increase in ground-glass opacities involving bilateral perihilar and lower lobes which may represent edema versus airspace consolidations. Recommend chest CT for further assessment if clinically warranted. Electronically Signed   By: Megan  Zare M.D.   On: 05/18/2024 15:11        Scheduled Meds:  sodium chloride    Intravenous Once   acetaminophen   650 mg Oral Once   aspirin  EC  81 mg Oral QHS   budesonide  (PULMICORT ) nebulizer solution  0.5 mg Nebulization BID   bumetanide   1 mg Oral BID   buPROPion   150 mg Oral Daily   Chlorhexidine  Gluconate Cloth  6 each Topical Daily   diphenhydrAMINE   25 mg Oral Once   enoxaparin  (LOVENOX ) injection  40 mg Subcutaneous Q24H   FLUoxetine   40 mg Oral Daily   fluticasone   2 spray Each Nare Daily   furosemide   20 mg Intravenous Once   guaiFENesin   1,200 mg Oral BID   hydrocortisone  sod succinate (SOLU-CORTEF ) inj  100 mg Intravenous Q8H   ipratropium-albuterol   3 mL Nebulization TID   lamoTRIgine   200 mg Oral QHS   lamoTRIgine   50 mg Oral q morning   levothyroxine   100 mcg Oral Q0600   loratadine   10 mg Oral Daily   OLANZapine   5 mg Oral QHS   pantoprazole   40 mg Oral Daily   potassium chloride   10 mEq Oral Daily   rosuvastatin   10 mg Oral Daily   saccharomyces boulardii  250 mg Oral BID   sacubitril -valsartan   1 tablet Oral Daily   sodium chloride  flush  10-40 mL Intracatheter Q12H   topiramate   150 mg Oral QHS   Continuous Infusions:  azithromycin  Stopped (05/19/24 1015)   ceFEPime  (MAXIPIME ) IV 2 g (05/19/24 1420)  LOS: 1 day    Time spent: 40 minutes    Toribio Hummer, MD [facility]   To contact the attending provider between 7A-7P or the covering provider during after  hours 7P-7A, please log into the web site [email or web address] and access using universal [place] password for that web site. If you do not have the password, please call the hospital operator.  05/19/2024, 7:15 PM

## 2024-05-20 DIAGNOSIS — J9621 Acute and chronic respiratory failure with hypoxia: Secondary | ICD-10-CM | POA: Diagnosis not present

## 2024-05-20 LAB — RENAL FUNCTION PANEL
Albumin: 3.1 g/dL — ABNORMAL LOW (ref 3.5–5.0)
Anion gap: 12 (ref 5–15)
BUN: 23 mg/dL — ABNORMAL HIGH (ref 6–20)
CO2: 23 mmol/L (ref 22–32)
Calcium: 7.7 mg/dL — ABNORMAL LOW (ref 8.9–10.3)
Chloride: 102 mmol/L (ref 98–111)
Creatinine, Ser: 1.01 mg/dL — ABNORMAL HIGH (ref 0.44–1.00)
GFR, Estimated: >60 mL/min (ref >60)
Glucose, Bld: 168 mg/dL — ABNORMAL HIGH (ref 70–99)
Phosphorus: 3 mg/dL (ref 2.5–4.6)
Potassium: 2.8 mmol/L — ABNORMAL LOW (ref 3.5–5.1)
Sodium: 137 mmol/L (ref 135–145)

## 2024-05-20 LAB — CBC WITH DIFFERENTIAL/PLATELET
Abs Immature Granulocytes: 0.13 K/uL — ABNORMAL HIGH (ref 0.00–0.07)
Basophils Absolute: 0 K/uL (ref 0.0–0.1)
Basophils Relative: 0 %
Eosinophils Absolute: 0 K/uL (ref 0.0–0.5)
Eosinophils Relative: 0 %
HCT: 28.6 % — ABNORMAL LOW (ref 36.0–46.0)
Hemoglobin: 8.7 g/dL — ABNORMAL LOW (ref 12.0–15.0)
Immature Granulocytes: 1 %
Lymphocytes Relative: 5 %
Lymphs Abs: 0.6 K/uL — ABNORMAL LOW (ref 0.7–4.0)
MCH: 24.2 pg — ABNORMAL LOW (ref 26.0–34.0)
MCHC: 30.4 g/dL (ref 30.0–36.0)
MCV: 79.7 fL — ABNORMAL LOW (ref 80.0–100.0)
Monocytes Absolute: 0.6 K/uL (ref 0.1–1.0)
Monocytes Relative: 5 %
Neutro Abs: 10.7 K/uL — ABNORMAL HIGH (ref 1.7–7.7)
Neutrophils Relative %: 89 %
Platelets: 302 K/uL (ref 150–400)
RBC: 3.59 MIL/uL — ABNORMAL LOW (ref 3.87–5.11)
RDW: 19.9 % — ABNORMAL HIGH (ref 11.5–15.5)
WBC: 12 K/uL — ABNORMAL HIGH (ref 4.0–10.5)
nRBC: 0 % (ref 0.0–0.2)

## 2024-05-20 LAB — MAGNESIUM: Magnesium: 2.1 mg/dL (ref 1.7–2.4)

## 2024-05-20 LAB — PROCALCITONIN: Procalcitonin: 2.05 ng/mL

## 2024-05-20 LAB — LEGIONELLA PNEUMOPHILA SEROGP 1 UR AG: L. pneumophila Serogp 1 Ur Ag: NEGATIVE

## 2024-05-20 LAB — VITAMIN D 25 HYDROXY (VIT D DEFICIENCY, FRACTURES): Vit D, 25-Hydroxy: 34.6 ng/mL (ref 30–100)

## 2024-05-20 MED ORDER — SODIUM CHLORIDE 0.9% FLUSH
10.0000 mL | Freq: Two times a day (BID) | INTRAVENOUS | Status: DC
  Administered 2024-05-20 – 2024-05-22 (×4): 10 mL

## 2024-05-20 MED ORDER — SODIUM CHLORIDE 0.9% FLUSH: 10.0000 mL | INTRAVENOUS | Status: DC | PRN

## 2024-05-20 MED ORDER — POTASSIUM CHLORIDE 10 MEQ/100ML IV SOLN
10.0000 meq | INTRAVENOUS | Status: AC
  Administered 2024-05-20 (×4): 10 meq via INTRAVENOUS
  Filled 2024-05-20: qty 100

## 2024-05-20 MED ORDER — AZITHROMYCIN 250 MG PO TABS
500.0000 mg | ORAL_TABLET | Freq: Every day | ORAL | Status: DC
  Administered 2024-05-21 – 2024-05-22 (×2): 500 mg via ORAL
  Filled 2024-05-20 (×2): qty 2

## 2024-05-20 MED ORDER — POTASSIUM CHLORIDE CRYS ER 20 MEQ PO TBCR
40.0000 meq | EXTENDED_RELEASE_TABLET | ORAL | Status: AC
  Administered 2024-05-20 (×2): 40 meq via ORAL
  Filled 2024-05-20 (×2): qty 2

## 2024-05-20 MED ORDER — SODIUM CHLORIDE 0.9 % IV SOLN
2.0000 g | INTRAVENOUS | Status: DC
  Administered 2024-05-20 – 2024-05-21 (×2): 2 g via INTRAVENOUS
  Filled 2024-05-20 (×2): qty 20

## 2024-05-20 MED ORDER — PANTOPRAZOLE SODIUM 40 MG IV SOLR
40.0000 mg | Freq: Two times a day (BID) | INTRAVENOUS | Status: DC
  Administered 2024-05-20: 40 mg via INTRAVENOUS
  Filled 2024-05-20: qty 10

## 2024-05-20 NOTE — Progress Notes (Signed)
 Patient refusing blood cultures to be drawn, MD made aware.

## 2024-05-20 NOTE — Plan of Care (Signed)
  Problem: Education: Goal: Knowledge of General Education information will improve Description: Including pain rating scale, medication(s)/side effects and non-pharmacologic comfort measures Outcome: Progressing   Problem: Health Behavior/Discharge Planning: Goal: Ability to manage health-related needs will improve Outcome: Progressing   Problem: Clinical Measurements: Goal: Ability to maintain clinical measurements within normal limits will improve Outcome: Progressing Goal: Will remain free from infection Outcome: Progressing Goal: Diagnostic test results will improve Outcome: Progressing Goal: Respiratory complications will improve Outcome: Progressing Goal: Cardiovascular complication will be avoided Outcome: Progressing   Problem: Activity: Goal: Risk for activity intolerance will decrease Outcome: Progressing   Problem: Nutrition: Goal: Adequate nutrition will be maintained Outcome: Progressing   Problem: Coping: Goal: Level of anxiety will decrease Outcome: Progressing   Problem: Elimination: Goal: Will not experience complications related to bowel motility Outcome: Progressing Goal: Will not experience complications related to urinary retention Outcome: Progressing   Problem: Pain Managment: Goal: General experience of comfort will improve and/or be controlled Outcome: Progressing   Problem: Safety: Goal: Ability to remain free from injury will improve Outcome: Progressing   Problem: Skin Integrity: Goal: Risk for impaired skin integrity will decrease Outcome: Progressing   Problem: Education: Goal: Ability to demonstrate management of disease process will improve Outcome: Progressing Goal: Ability to verbalize understanding of medication therapies will improve Outcome: Progressing Goal: Individualized Educational Video(s) Outcome: Progressing   Problem: Activity: Goal: Capacity to carry out activities will improve Outcome: Progressing    Problem: Cardiac: Goal: Ability to achieve and maintain adequate cardiopulmonary perfusion will improve Outcome: Progressing   Problem: Activity: Goal: Ability to tolerate increased activity will improve Outcome: Progressing   Problem: Clinical Measurements: Goal: Ability to maintain a body temperature in the normal range will improve Outcome: Progressing   Problem: Respiratory: Goal: Ability to maintain adequate ventilation will improve Outcome: Progressing Goal: Ability to maintain a clear airway will improve Outcome: Progressing

## 2024-05-20 NOTE — Plan of Care (Signed)
   Problem: Education: Goal: Knowledge of General Education information will improve Description: Including pain rating scale, medication(s)/side effects and non-pharmacologic comfort measures Outcome: Progressing   Problem: Activity: Goal: Risk for activity intolerance will decrease Outcome: Progressing

## 2024-05-20 NOTE — Progress Notes (Signed)
     Patient Name: [name]           DOB: [birth year]  MRN: [id number]      Admission Date: 05/18/2024  Attending Provider: Von Bellis, MD  Primary Diagnosis: Acute on chronic respiratory failure with hypoxia (HCC)   Level of care: Med-Surg   OVERNIGHT EVENT   Unwitnessed fall Patient was found sitting on the floor, she states she intentionally lowered herself to the floor as the cold floor helps her calm down.  Her husband reports this is something she does at home to help her feel better.  She is A/O and hemodynamically stable, however appears anxious and overwhelmed. Denies hitting her head or feeling any other pain or body discomfort. There are no visible signs of injury or deformities.  No obvious skin lacerations on my assessment.  RN to conduct their own independent assessment. Her lower extremities are significantly weak, but patient states she has been having a hard time ambulating even at home.  Per patient, mobility is at baseline.   Plan: Nursing staff to continue monitoring for change in acute symptoms, mobility, and pain. Bed alarm active Telemetry sitter for fall risk Fall mats in place Fall precaution   Orelia Brandstetter, DNP, ACNPC- AG [facility]

## 2024-05-20 NOTE — Progress Notes (Signed)
 PROGRESS NOTE    [name]  FMW:[id number] DOB: 08-May-1971 DOA: 05/18/2024 PCP: Toppin, Jean M, MD    Chief Complaint  Patient presents with   Shortness of Breath    Brief Narrative:  Patient 53 year old female history of Hodgkin's lymphoma, pulmonary artery hypertension, CHF, Addison's disease, migraines presenting with a 2-day history of fevers chills cough sputum production and shortness of breath.  Noted to have increased O2 requirements of 8 L nasal cannula on admission.  Imaging done concerning for multifocal pneumonia.  Patient admitted and placed empirically on IV antibiotics.   Assessment & Plan:   Principal Problem:   Acute on chronic respiratory failure with hypoxia (HCC) Active Problems:   CAP (community acquired pneumonia)   Hypokalemia   Grade I diastolic dysfunction   Addison's disease (HCC)   Generalized anxiety disorder/depression   Hx of Hodgkins lymphoma   Anxiety and depression   Postablative hypothyroidism   CKD stage 3a, GFR 45-59 ml/min (HCC)   Pulmonary arterial hypertension (HCC)   Pneumonia   Hypophosphatemia  #1 acute on chronic respiratory failure secondary to multifocal community-acquired pneumonia/rhinovirus/enterovirus infection - Patient presented with respiratory symptoms, noted to have a leukocytosis, increased O2 requirements from baseline. - Chest x-ray done on admission with interval increase in ground glass opacities involving bilateral perihilar and lower lobes which may represent edema versus airspace consolidation. - CT chest done with multifocal bilateral airspace disease most pronounced within the lower lobes appearance most consistent with multifocal bilateral bronchopneumonia.  Borderline enlarged mediastinal lymph nodes likely reactive. - Patient with some improvement with hypoxia and down to 5 L nasal cannula which she states is her baseline. - Patient however states no significant clinical improvement. - Sputum Gram stain  and culture pending. -  SARS coronavirus 2 PCR negative, - Influenza A and B by PCR negative. - RSV by PCR negative. - Respiratory viral panel was ordered this morning which is positive for the rhinovirus. - s/p IV Cefepime  and Azithromycin . - Check a procalcitonin levels and if not elevated could likely downgrade antibiotics. - Discontinue Qvar , Breo elliptica, Incruse. - Place on Pulmicort , scheduled DuoNebs, Claritin , Flonase , PPI. - Increase Mucinex  to 1200 mg twice daily. - Supportive care. 8/20 procalcitonin elevated.  Pharmacy suggested to de-escalate antibiotics, started ceftriaxone  and discontinued cefepime .   # Hypokalemia -Magnesium  2.0. - Potassium 2.8 Potassium repleted.  # Hypophosphatemia - Phosphorus of 2.1>>3.0 Phos repleted and resolved Check electrolytes daily  #  Anemia -Patient with no overt bleeding. - Checking anemia panel. -Check FOBT. - Transfused 2 units PRBCs.  4.  Grade 1 diastolic dysfunction -Continue home regimen Bumex .  5.  Addison's disease -Continue stress dose hydrocortisone .  6.  Postablative hypothyroidism -Continue Synthroid .  7.  CKD stage IIIa -Baseline creatinine 1.35. - Avoid nephrotoxins.  8.  Pulmonary artery hypertension -Outpatient follow-up.  9.  Depression/anxiety -Continue home regimen Zyprexa , Lamictal , Prozac , Wellbutrin .  # GERD: Increased pantoprazole , started IV 40 mg twice daily instead of oral because of nausea and vomiting.     DVT prophylaxis: Lovenox  Code Status: Full Family Communication: Updated patient and her husband at bedside. Disposition: Likely home when clinically improved.  Status is: Inpatient Remains inpatient appropriate because: Severity of illness   Consultants:  None  Procedures: Transfusion 2 units PRBCs 05/19/2024 CT chest 05/18/2024 Chest x-ray 05/18/2024   Antimicrobials:  Anti-infectives (From admission, onward)    Start     Dose/Rate Route Frequency Ordered Stop    05/21/24 1000  azithromycin  (ZITHROMAX ) tablet  500 mg        500 mg Oral Daily 05/20/24 1104 05/24/24 0959   05/20/24 1400  cefTRIAXone  (ROCEPHIN ) 2 g in sodium chloride  0.9 % 100 mL IVPB        2 g 200 mL/hr over 30 Minutes Intravenous Every 24 hours 05/20/24 1336 05/24/24 1429   05/19/24 1000  cefTRIAXone  (ROCEPHIN ) 2 g in sodium chloride  0.9 % 100 mL IVPB  Status:  Discontinued        2 g 200 mL/hr over 30 Minutes Intravenous Daily 05/18/24 1723 05/18/24 2257   05/19/24 1000  azithromycin  (ZITHROMAX ) 500 mg in sodium chloride  0.9 % 250 mL IVPB  Status:  Discontinued        500 mg 250 mL/hr over 60 Minutes Intravenous Daily 05/18/24 1738 05/18/24 2257   05/19/24 1000  azithromycin  (ZITHROMAX ) 500 mg in sodium chloride  0.9 % 250 mL IVPB  Status:  Discontinued        500 mg 250 mL/hr over 60 Minutes Intravenous Every 24 hours 05/18/24 2239 05/20/24 1103   05/19/24 0000  ceFEPIme  (MAXIPIME ) 2 g in sodium chloride  0.9 % 100 mL IVPB  Status:  Discontinued        2 g 200 mL/hr over 30 Minutes Intravenous Every 12 hours 05/18/24 2239 05/20/24 1336   05/18/24 1545  cefTRIAXone  (ROCEPHIN ) 1 g in sodium chloride  0.9 % 100 mL IVPB        1 g 200 mL/hr over 30 Minutes Intravenous  Once 05/18/24 1531 05/18/24 1756   05/18/24 1545  azithromycin  (ZITHROMAX ) 500 mg in sodium chloride  0.9 % 250 mL IVPB        500 mg 250 mL/hr over 60 Minutes Intravenous  Once 05/18/24 1531 05/18/24 1712         Subjective: Patient lying in bed.  States no significant change with shortness of breath and cough.  Husband at bedside.  Patient states chronically on 4-5 L nasal cannula at home.  Objective: Vitals:   05/20/24 0753 05/20/24 1150 05/20/24 1303 05/20/24 1514  BP:   (!) 140/78   Pulse:   96   Resp:   16   Temp:   98.1 F (36.7 C)   TempSrc:   Oral   SpO2: 98% 99% 100% 99%  Weight:      Height:        Intake/Output Summary (Last 24 hours) at 05/20/2024 1703 Last data filed at 05/20/2024 1500 Gross  per 24 hour  Intake 1881.5 ml  Output 750 ml  Net 1131.5 ml   Filed Weights   05/18/24 2200 05/19/24 0136 05/20/24 0500  Weight: 77.6 kg 76.2 kg 88.7 kg    Examination:  General exam: Appears calm and comfortable  Respiratory system: Coarse breath sounds diffusely. No significant wheezing noted. Fair air movement.  Speaking in full sentences.  No use of accessory muscles of respiration.   Cardiovascular system: S1 & S2 audible.  Positive murmur Gastrointestinal system: Abdomen is nondistended, soft and nontender. No organomegaly or masses felt. Normal bowel sounds heard. Central nervous system: Alert and oriented. No focal neurological deficits. Extremities: Symmetric 5 x 5 power. Skin: Eschar noted on dorsum of right foot.  Psychiatry: Anxious and depressed    Data Reviewed: I have personally reviewed following labs and imaging studies  CBC: Recent Labs  Lab 05/18/24 1405 05/19/24 0108 05/19/24 0850 05/19/24 1633 05/20/24 0241  WBC 16.7* 15.0* 14.3*  --  12.0*  NEUTROABS 14.0*  --   --   --  10.7*  HGB 7.9* 7.0* 6.8* 7.6* 8.7*  HCT 27.2* 24.6* 24.3* 25.7* 28.6*  MCV 74.7* 75.0* 76.4*  --  79.7*  PLT 365 312 301  --  302    Basic Metabolic Panel: Recent Labs  Lab 05/18/24 1405 05/19/24 0108 05/19/24 0850 05/20/24 0241 05/20/24 0500  NA 139  --  137  137  --  137  K 3.0*  --  3.3*  3.3*  --  2.8*  CL 105  --  102  102  --  102  CO2 24  --  22  22  --  23  GLUCOSE 121*  --  231*  227*  --  168*  BUN 28*  --  25*  25*  --  23*  CREATININE 1.35* 1.17* 1.32*  1.32*  --  1.01*  CALCIUM  9.1  --  8.4*  8.4*  --  7.7*  MG 1.9  --  2.0 2.1  --   PHOS  --   --  2.1*  --  3.0    GFR: Estimated Creatinine Clearance: 70.9 mL/min (A) (by C-G formula based on SCr of 1.01 mg/dL (H)).  Liver Function Tests: Recent Labs  Lab 05/18/24 1405 05/19/24 0850 05/20/24 0500  AST 19  --   --   ALT 19  --   --   ALKPHOS 50  --   --   BILITOT 0.6  --   --   PROT  6.9  --   --   ALBUMIN 3.7 3.2* 3.1*    CBG: No results for input(s): GLUCAP in the last 168 hours.   Recent Results (from the past 240 hours)  Respiratory (~20 pathogens) panel by PCR     Status: Abnormal   Collection Time: 05/18/24  9:42 AM   Specimen: Nasopharyngeal Swab; Respiratory  Result Value Ref Range Status   Adenovirus NOT DETECTED NOT DETECTED Final   Coronavirus 229E NOT DETECTED NOT DETECTED Final    Comment: (NOTE) The Coronavirus on the Respiratory Panel, DOES NOT test for the novel  Coronavirus (2019 nCoV)    Coronavirus HKU1 NOT DETECTED NOT DETECTED Final   Coronavirus NL63 NOT DETECTED NOT DETECTED Final   Coronavirus OC43 NOT DETECTED NOT DETECTED Final   Metapneumovirus NOT DETECTED NOT DETECTED Final   Rhinovirus / Enterovirus DETECTED (A) NOT DETECTED Final   Influenza A NOT DETECTED NOT DETECTED Final   Influenza B NOT DETECTED NOT DETECTED Final   Parainfluenza Virus 1 NOT DETECTED NOT DETECTED Final   Parainfluenza Virus 2 NOT DETECTED NOT DETECTED Final   Parainfluenza Virus 3 NOT DETECTED NOT DETECTED Final   Parainfluenza Virus 4 NOT DETECTED NOT DETECTED Final   Respiratory Syncytial Virus NOT DETECTED NOT DETECTED Final   Bordetella pertussis NOT DETECTED NOT DETECTED Final   Bordetella Parapertussis NOT DETECTED NOT DETECTED Final   Chlamydophila pneumoniae NOT DETECTED NOT DETECTED Final   Mycoplasma pneumoniae NOT DETECTED NOT DETECTED Final    Comment: Performed at [facility], [address] [place], [place] [address]  Resp panel by RT-PCR (RSV, Flu A&B, Covid) Anterior Nasal Swab     Status: None   Collection Time: 05/18/24  2:05 PM   Specimen: Anterior Nasal Swab  Result Value Ref Range Status   SARS Coronavirus 2 by RT PCR NEGATIVE NEGATIVE Final    Comment: (NOTE) SARS-CoV-2 target nucleic acids are NOT DETECTED.  The SARS-CoV-2 RNA is generally detectable in upper respiratory specimens during the acute  phase of  infection. The lowest concentration of SARS-CoV-2 viral copies this assay can detect is 138 copies/mL. A negative result does not preclude SARS-Cov-2 infection and should not be used as the sole basis for treatment or other patient management decisions. A negative result may occur with  improper specimen collection/handling, submission of specimen other than nasopharyngeal swab, presence of viral mutation(s) within the areas targeted by this assay, and inadequate number of viral copies(<138 copies/mL). A negative result must be combined with clinical observations, patient history, and epidemiological information. The expected result is Negative.  Fact Sheet for Patients:  BloggerCourse.com  Fact Sheet for Healthcare Providers:  SeriousBroker.it  This test is no t yet approved or cleared by the [place]  FDA and  has been authorized for detection and/or diagnosis of SARS-CoV-2 by FDA under an Emergency Use Authorization (EUA). This EUA will remain  in effect (meaning this test can be used) for the duration of the COVID-19 declaration under Section 564(b)(1) of the Act, 21 U.S.C.section 360bbb-3(b)(1), unless the authorization is terminated  or revoked sooner.       Influenza A by PCR NEGATIVE NEGATIVE Final   Influenza B by PCR NEGATIVE NEGATIVE Final    Comment: (NOTE) The Xpert Xpress SARS-CoV-2/FLU/RSV plus assay is intended as an aid in the diagnosis of influenza from Nasopharyngeal swab specimens and should not be used as a sole basis for treatment. Nasal washings and aspirates are unacceptable for Xpert Xpress SARS-CoV-2/FLU/RSV testing.  Fact Sheet for Patients: BloggerCourse.com  Fact Sheet for Healthcare Providers: SeriousBroker.it  This test is not yet approved or cleared by the [place]  FDA and has been authorized for detection and/or diagnosis of SARS-CoV-2  by FDA under an Emergency Use Authorization (EUA). This EUA will remain in effect (meaning this test can be used) for the duration of the COVID-19 declaration under Section 564(b)(1) of the Act, 21 U.S.C. section 360bbb-3(b)(1), unless the authorization is terminated or revoked.     Resp Syncytial Virus by PCR NEGATIVE NEGATIVE Final    Comment: (NOTE) Fact Sheet for Patients: BloggerCourse.com  Fact Sheet for Healthcare Providers: SeriousBroker.it  This test is not yet approved or cleared by the [place]  FDA and has been authorized for detection and/or diagnosis of SARS-CoV-2 by FDA under an Emergency Use Authorization (EUA). This EUA will remain in effect (meaning this test can be used) for the duration of the COVID-19 declaration under Section 564(b)(1) of the Act, 21 U.S.C. section 360bbb-3(b)(1), unless the authorization is terminated or revoked.  Performed at [facility], [address], [place], [place] [address]   Group A Strep by PCR     Status: None   Collection Time: 05/18/24  5:21 PM   Specimen: Throat; Sterile Swab  Result Value Ref Range Status   Group A Strep by PCR NOT DETECTED NOT DETECTED Final    Comment: Performed at [facility], [address], [place], [place] [address]  Expectorated Sputum Assessment w Gram Stain, Rflx to Resp Cult     Status: None   Collection Time: 05/19/24  9:13 AM   Specimen: Sputum  Result Value Ref Range Status   Specimen Description SPUTUM  Final   Special Requests NONE  Final   Sputum evaluation   Final    Sputum specimen not acceptable for testing.  Please recollect.   NOTIFIED BROCKS E. Performed at [facility], [address] [place], [place] [address]    Report Status 05/19/2024 FINAL  Final  Culture, blood (routine x 2) Call MD if unable to obtain prior to antibiotics being given     Status: None  (Preliminary result)   Collection Time: 05/19/24 10:27 AM   Specimen: BLOOD  Result Value Ref Range Status   Specimen Description   Final    BLOOD BLOOD RIGHT HAND AEROBIC BOTTLE ONLY Performed at [facility], [address] [place], [place] [address]    Special Requests   Final    BOTTLES DRAWN AEROBIC ONLY Blood Culture results may not be optimal due to an inadequate volume of blood received in culture bottles Performed at [facility], [address], [place], [place] [address]    Culture   Final    NO GROWTH < 24 HOURS Performed at [facility], [address] [place], [place] [address]    Report Status PENDING  Incomplete  Culture, blood (Routine X 2) w Reflex to ID Panel     Status: None (Preliminary result)   Collection Time: 05/19/24 10:27 AM   Specimen: BLOOD RIGHT ARM  Result Value Ref Range Status   Specimen Description   Final    BLOOD RIGHT ARM AEROBIC BOTTLE ONLY Performed at [facility], [address] [place], [place] [address]    Special Requests   Final    BOTTLES DRAWN AEROBIC ONLY Blood Culture results may not be optimal due to an inadequate volume of blood received in culture bottles Performed at [facility], [address], [place], [place] [address]    Culture   Final    NO GROWTH < 24 HOURS Performed at [facility], [address] [place], [place] [address]    Report Status PENDING  Incomplete  Expectorated Sputum Assessment w Gram Stain, Rflx to Resp Cult     Status: None   Collection Time: 05/19/24 11:47 AM   Specimen: Expectorated Sputum  Result Value Ref Range Status   Specimen Description EXPECTORATED SPUTUM  Final   Special Requests NONE  Final   Sputum evaluation   Final    THIS SPECIMEN IS ACCEPTABLE FOR SPUTUM CULTURE Performed at [facility], [address], [place], [place] [address]    Report Status 05/19/2024 FINAL  Final   Culture, Respiratory w Gram Stain     Status: None (Preliminary result)   Collection Time: 05/19/24 11:47 AM  Result Value Ref Range Status   Specimen Description   Final    EXPECTORATED SPUTUM Performed at [facility], [address] [place], [place] [address]    Special Requests   Final    NONE Reflexed from [id number] Performed at [facility], [address] [place], [place] [address]    Gram Stain   Final    WBC PRESENT, PREDOMINANTLY MONONUCLEAR RARE GRAM POSITIVE RODS RARE BUDDING YEAST SEEN RARE GRAM POSITIVE COCCI IN PAIRS    Culture   Final    CULTURE REINCUBATED FOR BETTER GROWTH Performed at [facility], [address] [place], [place] [address]    Report Status PENDING  Incomplete         Radiology Studies: CT Chest W Contrast Result Date: 05/18/2024 CLINICAL DATA:  Increased shortness of breath since yesterday, chest pain, cough EXAM: CT CHEST WITH CONTRAST TECHNIQUE: Multidetector CT imaging of the chest was performed during intravenous contrast administration. RADIATION DOSE REDUCTION: This exam was performed according to the departmental dose-optimization program  which includes automated exposure control, adjustment of the mA and/or kV according to patient size and/or use of iterative reconstruction technique. CONTRAST:  75mL OMNIPAQUE  IOHEXOL  300 MG/ML  SOLN COMPARISON:  05/18/2024, 01/23/2023 FINDINGS: Cardiovascular: The heart is unremarkable without pericardial effusion. Calcifications of the aortic valve are noted. No evidence of thoracic aortic aneurysm or dissection. Atherosclerosis of the aorta and coronary vasculature. Mediastinum/Nodes: Borderline enlarged mediastinal lymph nodes, measuring up to 11 mm in the right paratracheal region and 10 mm in the subcarinal region. Trachea and esophagus are unremarkable. Lungs/Pleura: Multifocal bilateral airspace disease within the right upper lobe and most pronounced within  the bilateral lower lobes. No effusion or pneumothorax. The central airways are patent. Upper Abdomen: No acute abnormality.  Bilateral breast prostheses. Musculoskeletal: Left-sided PICC tip within the superior vena cava. No acute or destructive bony abnormalities. Reconstructed images demonstrate no additional findings. IMPRESSION: 1. Multifocal bilateral airspace disease, most pronounced within the lower lobes, with an appearance most consistent with multifocal bilateral bronchopneumonia. 2. Borderline enlarged mediastinal lymph nodes, likely reactive. 3. Aortic Atherosclerosis (ICD10-I70.0). Coronary artery atherosclerosis. Electronically Signed   By: Ozell Daring M.D.   On: 05/18/2024 18:57        Scheduled Meds:  aspirin  EC  81 mg Oral QHS   [START ON 05/21/2024] azithromycin   500 mg Oral Daily   budesonide  (PULMICORT ) nebulizer solution  0.5 mg Nebulization BID   bumetanide   1 mg Oral BID   buPROPion   150 mg Oral Daily   Chlorhexidine  Gluconate Cloth  6 each Topical Daily   diphenhydrAMINE   25 mg Oral Once   enoxaparin  (LOVENOX ) injection  40 mg Subcutaneous Q24H   FLUoxetine   40 mg Oral Daily   fluticasone   2 spray Each Nare Daily   guaiFENesin   1,200 mg Oral BID   hydrocortisone  sod succinate (SOLU-CORTEF ) inj  100 mg Intravenous Q8H   ipratropium-albuterol   3 mL Nebulization TID   lamoTRIgine   200 mg Oral QHS   lamoTRIgine   50 mg Oral q morning   levothyroxine   100 mcg Oral Q0600   loratadine   10 mg Oral Daily   OLANZapine   5 mg Oral QHS   pantoprazole   40 mg Oral Daily   potassium chloride   10 mEq Oral Daily   potassium chloride   40 mEq Oral Q4H   rosuvastatin   10 mg Oral Daily   saccharomyces boulardii  250 mg Oral BID   sacubitril -valsartan   1 tablet Oral Daily   sodium chloride  flush  10-40 mL Intracatheter Q12H   sodium chloride  flush  10-40 mL Intracatheter Q12H   topiramate   150 mg Oral QHS   Continuous Infusions:  cefTRIAXone  (ROCEPHIN )  IV 2 g (05/20/24 1409)      LOS: 2 days    Time spent: 40 minutes    Elvan Sor, MD [facility]   To contact the attending provider between 7A-7P or the covering provider during after hours 7P-7A, please log into the web site [email or web address] and access using universal [place] password for that web site. If you do not have the password, please call the hospital operator.  05/20/2024, 5:03 PM

## 2024-05-21 DIAGNOSIS — J9621 Acute and chronic respiratory failure with hypoxia: Secondary | ICD-10-CM | POA: Diagnosis not present

## 2024-05-21 LAB — TYPE AND SCREEN
ABO/RH(D): O POS
Antibody Screen: POSITIVE
DAT, IgG: NEGATIVE
Donor AG Type: NEGATIVE
Donor AG Type: NEGATIVE
PT AG Type: NEGATIVE
Unit division: 0
Unit division: 0

## 2024-05-21 LAB — BASIC METABOLIC PANEL WITH GFR
Anion gap: 12 (ref 5–15)
BUN: 28 mg/dL — ABNORMAL HIGH (ref 6–20)
CO2: 24 mmol/L (ref 22–32)
Calcium: 8 mg/dL — ABNORMAL LOW (ref 8.9–10.3)
Chloride: 105 mmol/L (ref 98–111)
Creatinine, Ser: 1.04 mg/dL — ABNORMAL HIGH (ref 0.44–1.00)
GFR, Estimated: >60 mL/min (ref >60)
Glucose, Bld: 158 mg/dL — ABNORMAL HIGH (ref 70–99)
Potassium: 3.1 mmol/L — ABNORMAL LOW (ref 3.5–5.1)
Sodium: 141 mmol/L (ref 135–145)

## 2024-05-21 LAB — CBC
HCT: 30.1 % — ABNORMAL LOW (ref 36.0–46.0)
Hemoglobin: 8.9 g/dL — ABNORMAL LOW (ref 12.0–15.0)
MCH: 23.8 pg — ABNORMAL LOW (ref 26.0–34.0)
MCHC: 29.6 g/dL — ABNORMAL LOW (ref 30.0–36.0)
MCV: 80.5 fL (ref 80.0–100.0)
Platelets: 346 K/uL (ref 150–400)
RBC: 3.74 MIL/uL — ABNORMAL LOW (ref 3.87–5.11)
RDW: 20.3 % — ABNORMAL HIGH (ref 11.5–15.5)
WBC: 11.9 K/uL — ABNORMAL HIGH (ref 4.0–10.5)
nRBC: 0 % (ref 0.0–0.2)

## 2024-05-21 LAB — BPAM RBC
Blood Product Expiration Date: 202509132359
Blood Product Expiration Date: 202509152359
ISSUE DATE / TIME: 202508191243
ISSUE DATE / TIME: 202508200015
Unit Type and Rh: 202509132359
Unit Type and Rh: 5100
Unit Type and Rh: 5100

## 2024-05-21 LAB — MAGNESIUM: Magnesium: 2.1 mg/dL (ref 1.7–2.4)

## 2024-05-21 LAB — PHOSPHORUS: Phosphorus: 2.1 mg/dL — ABNORMAL LOW (ref 2.5–4.6)

## 2024-05-21 MED ORDER — POTASSIUM CHLORIDE 10 MEQ/100ML IV SOLN
10.0000 meq | INTRAVENOUS | Status: AC
  Administered 2024-05-21 (×4): 10 meq via INTRAVENOUS
  Filled 2024-05-21 (×4): qty 100

## 2024-05-21 MED ORDER — PANTOPRAZOLE SODIUM 40 MG PO TBEC
40.0000 mg | DELAYED_RELEASE_TABLET | Freq: Two times a day (BID) | ORAL | Status: DC
  Administered 2024-05-21 – 2024-05-22 (×3): 40 mg via ORAL
  Filled 2024-05-21 (×3): qty 1

## 2024-05-21 MED ORDER — GUAIFENESIN 100 MG/5ML PO LIQD
10.0000 mL | Freq: Three times a day (TID) | ORAL | Status: DC
  Administered 2024-05-21 – 2024-05-22 (×3): 10 mL via ORAL
  Filled 2024-05-21 (×3): qty 10

## 2024-05-21 MED ORDER — POTASSIUM CHLORIDE ER 10 MEQ PO TBCR
10.0000 meq | EXTENDED_RELEASE_TABLET | Freq: Every day | ORAL | Status: DC
  Administered 2024-05-22: 10 meq via ORAL
  Filled 2024-05-21 (×3): qty 1

## 2024-05-21 MED ORDER — POTASSIUM PHOSPHATES 15 MMOLE/5ML IV SOLN
30.0000 mmol | Freq: Once | INTRAVENOUS | Status: AC
  Administered 2024-05-21: 30 mmol via INTRAVENOUS
  Filled 2024-05-21: qty 10

## 2024-05-21 MED ORDER — BENZONATATE 100 MG PO CAPS
100.0000 mg | ORAL_CAPSULE | Freq: Two times a day (BID) | ORAL | Status: DC | PRN
  Administered 2024-05-21: 100 mg via ORAL
  Filled 2024-05-21: qty 1

## 2024-05-21 MED ORDER — HYDROXYZINE HCL 10 MG/5ML PO SYRP
25.0000 mg | ORAL_SOLUTION | Freq: Three times a day (TID) | ORAL | Status: DC
  Administered 2024-05-21 – 2024-05-22 (×2): 25 mg via ORAL
  Filled 2024-05-21 (×5): qty 12.5

## 2024-05-21 MED ORDER — GUAIFENESIN-DM 100-10 MG/5ML PO SYRP
5.0000 mL | ORAL_SOLUTION | ORAL | Status: DC | PRN
Start: 2024-05-21 — End: 2024-05-21
  Administered 2024-05-21: 5 mL via ORAL
  Filled 2024-05-21: qty 10

## 2024-05-21 MED ORDER — GUAIFENESIN-DM 100-10 MG/5ML PO SYRP: 5.0000 mL | ORAL_SOLUTION | Freq: Four times a day (QID) | ORAL | Status: DC | PRN

## 2024-05-21 NOTE — Progress Notes (Signed)
 Pt refused flutter valve. Pt states she has several of these at home.

## 2024-05-21 NOTE — Progress Notes (Signed)
 PROGRESS NOTE    [name]  FMW:[id number] DOB: [birth year] DOA: 05/18/2024 PCP: Toppin, Jean M, MD    Chief Complaint  Patient presents with   Shortness of Breath    Brief Narrative:  Patient 53 year old female history of Hodgkin's lymphoma, pulmonary artery hypertension, CHF, Addison's disease, migraines presenting with a 2-day history of fevers chills cough sputum production and shortness of breath.  Noted to have increased O2 requirements of 8 L nasal cannula on admission.  Imaging done concerning for multifocal pneumonia.  Patient admitted and placed empirically on IV antibiotics.   Assessment & Plan:   Principal Problem:   Acute on chronic respiratory failure with hypoxia (HCC) Active Problems:   CAP (community acquired pneumonia)   Hypokalemia   Grade I diastolic dysfunction   Addison's disease (HCC)   Generalized anxiety disorder/depression   Hx of Hodgkins lymphoma   Anxiety and depression   Postablative hypothyroidism   CKD stage 3a, GFR 45-59 ml/min (HCC)   Pulmonary arterial hypertension (HCC)   Pneumonia   Hypophosphatemia  #1 acute on chronic respiratory failure secondary to multifocal community-acquired pneumonia/rhinovirus/enterovirus infection - Patient presented with respiratory symptoms, noted to have a leukocytosis, increased O2 requirements from baseline. - Chest x-ray done on admission with interval increase in ground glass opacities involving bilateral perihilar and lower lobes which may represent edema versus airspace consolidation. - CT chest done with multifocal bilateral airspace disease most pronounced within the lower lobes appearance most consistent with multifocal bilateral bronchopneumonia.  Borderline enlarged mediastinal lymph nodes likely reactive. - Patient with some improvement with hypoxia and down to 5 L nasal cannula which she states is her baseline. - Patient however states no significant clinical improvement. - Sputum Gram stain  and culture pending. -  SARS coronavirus 2 PCR negative, - Influenza A and B by PCR negative. - RSV by PCR negative. - Respiratory viral panel was ordered this morning which is positive for the rhinovirus. - s/p IV Cefepime  and Azithromycin . - Discontinue Qvar , Breo elliptica, Incruse. - Place on Pulmicort , scheduled DuoNebs, Claritin , Flonase , PPI. - Increase Mucinex  to 1200 mg twice daily. - Supportive care. 8/20 procalcitonin elevated.  Pharmacy suggested to de-escalate antibiotics, started ceftriaxone  and discontinued cefepime .   # Hypokalemia: Potassium repleted -Magnesium  2.0. - Potassium 2.1  # Hypophosphatemia - Phosphorus of 2.1>>3.0>>2.1 Phos repleted  Check electrolytes daily  #  Anemia -Patient with no overt bleeding. - Checking anemia panel. -Check FOBT. - Transfused 2 units PRBCs.  4.  Grade 1 diastolic dysfunction -Continue home regimen Bumex .  5.  Addison's disease -Continue stress dose hydrocortisone .  6.  Postablative hypothyroidism -Continue Synthroid .  7.  CKD stage IIIa -Baseline creatinine 1.35. - Avoid nephrotoxins.  8.  Pulmonary artery hypertension -Outpatient follow-up.  9.  Depression/anxiety -Continue home regimen Zyprexa , Lamictal , Prozac , Wellbutrin .  # GERD: Increased pantoprazole , started IV 40 mg twice daily instead of oral because of nausea and vomiting.     DVT prophylaxis: Lovenox  Code Status: Full Family Communication: Updated patient and her husband at bedside. Disposition: Likely home when clinically improved.  Status is: Inpatient Remains inpatient appropriate because: Severity of illness   Consultants:  None  Procedures: Transfusion 2 units PRBCs 05/19/2024 CT chest 05/18/2024 Chest x-ray 05/18/2024   Antimicrobials:  Anti-infectives (From admission, onward)    Start     Dose/Rate Route Frequency Ordered Stop   05/21/24 1000  azithromycin  (ZITHROMAX ) tablet 500 mg        500 mg Oral Daily 05/20/24 1104  05/24/24 0959   05/20/24 1400  cefTRIAXone  (ROCEPHIN ) 2 g in sodium chloride  0.9 % 100 mL IVPB        2 g 200 mL/hr over 30 Minutes Intravenous Every 24 hours 05/20/24 1336 05/24/24 1429   05/19/24 1000  cefTRIAXone  (ROCEPHIN ) 2 g in sodium chloride  0.9 % 100 mL IVPB  Status:  Discontinued        2 g 200 mL/hr over 30 Minutes Intravenous Daily 05/18/24 1723 05/18/24 2257   05/19/24 1000  azithromycin  (ZITHROMAX ) 500 mg in sodium chloride  0.9 % 250 mL IVPB  Status:  Discontinued        500 mg 250 mL/hr over 60 Minutes Intravenous Daily 05/18/24 1738 05/18/24 2257   05/19/24 1000  azithromycin  (ZITHROMAX ) 500 mg in sodium chloride  0.9 % 250 mL IVPB  Status:  Discontinued        500 mg 250 mL/hr over 60 Minutes Intravenous Every 24 hours 05/18/24 2239 05/20/24 1103   05/19/24 0000  ceFEPIme  (MAXIPIME ) 2 g in sodium chloride  0.9 % 100 mL IVPB  Status:  Discontinued        2 g 200 mL/hr over 30 Minutes Intravenous Every 12 hours 05/18/24 2239 05/20/24 1336   05/18/24 1545  cefTRIAXone  (ROCEPHIN ) 1 g in sodium chloride  0.9 % 100 mL IVPB        1 g 200 mL/hr over 30 Minutes Intravenous  Once 05/18/24 1531 05/18/24 1756   05/18/24 1545  azithromycin  (ZITHROMAX ) 500 mg in sodium chloride  0.9 % 250 mL IVPB        500 mg 250 mL/hr over 60 Minutes Intravenous  Once 05/18/24 1531 05/18/24 1712         Subjective: Patient was seen and examined at bedside today. Patient's breathing is stable, saturating well on 4 L oxygen  which is her baseline. Patient had significant cough with some blood. Some nausea and had small amount of vomiting x 3. No diarrhea, moving bowels without any trouble   Objective: Vitals:   05/21/24 0618 05/21/24 0802 05/21/24 0805 05/21/24 1419  BP: 121/70   100/82  Pulse: 87   91  Resp: 17   18  Temp: 98.9 F (37.2 C)   98.1 F (36.7 C)  TempSrc: Oral   Oral  SpO2: 97% 98% 98% 100%  Weight:      Height:        Intake/Output Summary (Last 24 hours) at 05/21/2024  1436 Last data filed at 05/21/2024 1400 Gross per 24 hour  Intake 1330 ml  Output 2150 ml  Net -820 ml   Filed Weights   05/19/24 0136 05/20/24 0500 05/21/24 0500  Weight: 76.2 kg 88.7 kg 89 kg    Examination:  General exam: Appears calm and comfortable  Respiratory system: Mild crackles bilaterally. No significant wheezing noted. Fair air movement.  Speaking in full sentences.  No use of accessory muscles of respiration.   Cardiovascular system: S1 & S2 audible.  Positive murmur Gastrointestinal system: Abdomen is nondistended, soft and nontender. No organomegaly or masses felt. Normal bowel sounds heard. Central nervous system: Alert and oriented. No focal neurological deficits. Extremities: Symmetric 5 x 5 power. Skin: Eschar noted on dorsum of right foot.  Psychiatry: Anxious and depressed    Data Reviewed: I have personally reviewed following labs and imaging studies  CBC: Recent Labs  Lab 05/18/24 1405 05/19/24 0108 05/19/24 0850 05/19/24 1633 05/20/24 0241 05/21/24 0231  WBC 16.7* 15.0* 14.3*  --  12.0* 11.9*  NEUTROABS  14.0*  --   --   --  10.7*  --   HGB 7.9* 7.0* 6.8* 7.6* 8.7* 8.9*  HCT 27.2* 24.6* 24.3* 25.7* 28.6* 30.1*  MCV 74.7* 75.0* 76.4*  --  79.7* 80.5  PLT 365 312 301  --  302 346    Basic Metabolic Panel: Recent Labs  Lab 05/18/24 1405 05/19/24 0108 05/19/24 0850 05/20/24 0241 05/20/24 0500 05/21/24 0231  NA 139  --  137  137  --  137 141  K 3.0*  --  3.3*  3.3*  --  2.8* 3.1*  CL 105  --  102  102  --  102 105  CO2 24  --  22  22  --  23 24  GLUCOSE 121*  --  231*  227*  --  168* 158*  BUN 28*  --  25*  25*  --  23* 28*  CREATININE 1.35* 1.17* 1.32*  1.32*  --  1.01* 1.04*  CALCIUM  9.1  --  8.4*  8.4*  --  7.7* 8.0*  MG 1.9  --  2.0 2.1  --  2.1  PHOS  --   --  2.1*  --  3.0 2.1*    GFR: Estimated Creatinine Clearance: 68.9 mL/min (A) (by C-G formula based on SCr of 1.04 mg/dL (H)).  Liver Function Tests: Recent Labs   Lab 05/18/24 1405 05/19/24 0850 05/20/24 0500  AST 19  --   --   ALT 19  --   --   ALKPHOS 50  --   --   BILITOT 0.6  --   --   PROT 6.9  --   --   ALBUMIN 3.7 3.2* 3.1*    CBG: No results for input(s): GLUCAP in the last 168 hours.   Recent Results (from the past 240 hours)  Respiratory (~20 pathogens) panel by PCR     Status: Abnormal   Collection Time: 05/18/24  9:42 AM   Specimen: Nasopharyngeal Swab; Respiratory  Result Value Ref Range Status   Adenovirus NOT DETECTED NOT DETECTED Final   Coronavirus 229E NOT DETECTED NOT DETECTED Final    Comment: (NOTE) The Coronavirus on the Respiratory Panel, DOES NOT test for the novel  Coronavirus (2019 nCoV)    Coronavirus HKU1 NOT DETECTED NOT DETECTED Final   Coronavirus NL63 NOT DETECTED NOT DETECTED Final   Coronavirus OC43 NOT DETECTED NOT DETECTED Final   Metapneumovirus NOT DETECTED NOT DETECTED Final   Rhinovirus / Enterovirus DETECTED (A) NOT DETECTED Final   Influenza A NOT DETECTED NOT DETECTED Final   Influenza B NOT DETECTED NOT DETECTED Final   Parainfluenza Virus 1 NOT DETECTED NOT DETECTED Final   Parainfluenza Virus 2 NOT DETECTED NOT DETECTED Final   Parainfluenza Virus 3 NOT DETECTED NOT DETECTED Final   Parainfluenza Virus 4 NOT DETECTED NOT DETECTED Final   Respiratory Syncytial Virus NOT DETECTED NOT DETECTED Final   Bordetella pertussis NOT DETECTED NOT DETECTED Final   Bordetella Parapertussis NOT DETECTED NOT DETECTED Final   Chlamydophila pneumoniae NOT DETECTED NOT DETECTED Final   Mycoplasma pneumoniae NOT DETECTED NOT DETECTED Final    Comment: Performed at [facility], [address], [place], [place] [address]  Resp panel by RT-PCR (RSV, Flu A&B, Covid) Anterior Nasal Swab     Status: None   Collection Time: 05/18/24  2:05 PM   Specimen: Anterior Nasal Swab  Result Value Ref Range Status   SARS Coronavirus 2 by RT PCR  NEGATIVE NEGATIVE Final    Comment: (NOTE) SARS-CoV-2 target  nucleic acids are NOT DETECTED.  The SARS-CoV-2 RNA is generally detectable in upper respiratory specimens during the acute phase of infection. The lowest concentration of SARS-CoV-2 viral copies this assay can detect is 138 copies/mL. A negative result does not preclude SARS-Cov-2 infection and should not be used as the sole basis for treatment or other patient management decisions. A negative result may occur with  improper specimen collection/handling, submission of specimen other than nasopharyngeal swab, presence of viral mutation(s) within the areas targeted by this assay, and inadequate number of viral copies(<138 copies/mL). A negative result must be combined with clinical observations, patient history, and epidemiological information. The expected result is Negative.  Fact Sheet for Patients:  BloggerCourse.com  Fact Sheet for Healthcare Providers:  SeriousBroker.it  This test is no t yet approved or cleared by the [place]  FDA and  has been authorized for detection and/or diagnosis of SARS-CoV-2 by FDA under an Emergency Use Authorization (EUA). This EUA will remain  in effect (meaning this test can be used) for the duration of the COVID-19 declaration under Section 564(b)(1) of the Act, 21 U.S.C.section 360bbb-3(b)(1), unless the authorization is terminated  or revoked sooner.       Influenza A by PCR NEGATIVE NEGATIVE Final   Influenza B by PCR NEGATIVE NEGATIVE Final    Comment: (NOTE) The Xpert Xpress SARS-CoV-2/FLU/RSV plus assay is intended as an aid in the diagnosis of influenza from Nasopharyngeal swab specimens and should not be used as a sole basis for treatment. Nasal washings and aspirates are unacceptable for Xpert Xpress SARS-CoV-2/FLU/RSV testing.  Fact Sheet for Patients: BloggerCourse.com  Fact Sheet for Healthcare  Providers: SeriousBroker.it  This test is not yet approved or cleared by the [place]  FDA and has been authorized for detection and/or diagnosis of SARS-CoV-2 by FDA under an Emergency Use Authorization (EUA). This EUA will remain in effect (meaning this test can be used) for the duration of the COVID-19 declaration under Section 564(b)(1) of the Act, 21 U.S.C. section 360bbb-3(b)(1), unless the authorization is terminated or revoked.     Resp Syncytial Virus by PCR NEGATIVE NEGATIVE Final    Comment: (NOTE) Fact Sheet for Patients: BloggerCourse.com  Fact Sheet for Healthcare Providers: SeriousBroker.it  This test is not yet approved or cleared by the [place]  FDA and has been authorized for detection and/or diagnosis of SARS-CoV-2 by FDA under an Emergency Use Authorization (EUA). This EUA will remain in effect (meaning this test can be used) for the duration of the COVID-19 declaration under Section 564(b)(1) of the Act, 21 U.S.C. section 360bbb-3(b)(1), unless the authorization is terminated or revoked.  Performed at [facility], [address], [place], [place] [address]   Group A Strep by PCR     Status: None   Collection Time: 05/18/24  5:21 PM   Specimen: Throat; Sterile Swab  Result Value Ref Range Status   Group A Strep by PCR NOT DETECTED NOT DETECTED Final    Comment: Performed at [facility], [address] [place], [place] [address]  Expectorated Sputum Assessment w Gram Stain, Rflx to Resp Cult     Status: None   Collection Time: 05/19/24  9:13 AM   Specimen: Sputum  Result Value Ref Range Status   Specimen Description SPUTUM  Final   Special Requests NONE  Final   Sputum evaluation   Final    Sputum specimen not acceptable for  testing.  Please recollect.   NOTIFIED BROCKS E. Performed at [facility], [address], [place], [place] [address]    Report Status 05/19/2024 FINAL  Final  Culture, blood (routine x 2) Call MD if unable to obtain prior to antibiotics being given     Status: None (Preliminary result)   Collection Time: 05/19/24 10:27 AM   Specimen: BLOOD  Result Value Ref Range Status   Specimen Description   Final    BLOOD BLOOD RIGHT HAND AEROBIC BOTTLE ONLY Performed at [facility], [address], [place], [place] [address]    Special Requests   Final    BOTTLES DRAWN AEROBIC ONLY Blood Culture results may not be optimal due to an inadequate volume of blood received in culture bottles Performed at [facility], [address], [place], [place] [address]    Culture   Final    NO GROWTH 2 DAYS Performed at [facility], [address] [place], [place] [address]    Report Status PENDING  Incomplete  Culture, blood (Routine X 2) w Reflex to ID Panel     Status: None (Preliminary result)   Collection Time: 05/19/24 10:27 AM   Specimen: BLOOD RIGHT ARM  Result Value Ref Range Status   Specimen Description   Final    BLOOD RIGHT ARM AEROBIC BOTTLE ONLY Performed at [facility], [address] [place], [place] [address]    Special Requests   Final    BOTTLES DRAWN AEROBIC ONLY Blood Culture results may not be optimal due to an inadequate volume of blood received in culture bottles Performed at [facility], [address], [place], [place] [address]    Culture   Final    NO GROWTH 2 DAYS Performed at [facility], [address] [place], [place] [address]    Report Status PENDING  Incomplete  Expectorated Sputum Assessment w Gram Stain, Rflx to Resp Cult     Status: None   Collection Time: 05/19/24 11:47 AM   Specimen: Expectorated Sputum  Result Value Ref Range Status   Specimen Description EXPECTORATED SPUTUM  Final   Special Requests NONE  Final   Sputum evaluation   Final     THIS SPECIMEN IS ACCEPTABLE FOR SPUTUM CULTURE Performed at [facility], [address], [place], [place] [address]    Report Status 05/19/2024 FINAL  Final  Culture, Respiratory w Gram Stain     Status: None (Preliminary result)   Collection Time: 05/19/24 11:47 AM  Result Value Ref Range Status   Specimen Description   Final    EXPECTORATED SPUTUM Performed at [facility], [address] [place], [place] [address]    Special Requests   Final    NONE Reflexed from [phone] Performed at [facility], [address] [place], [place] [address]    Gram Stain   Final    WBC PRESENT, PREDOMINANTLY MONONUCLEAR RARE GRAM POSITIVE RODS RARE BUDDING YEAST SEEN RARE GRAM POSITIVE COCCI IN PAIRS    Culture   Final    CULTURE REINCUBATED FOR BETTER GROWTH Performed at [facility], [address], [place], [place] [address]    Report Status PENDING  Incomplete         Radiology Studies: No results found.       Scheduled Meds:  aspirin  EC  81 mg Oral QHS   azithromycin   500 mg Oral Daily   budesonide  (PULMICORT ) nebulizer solution  0.5 mg Nebulization BID   bumetanide   1 mg Oral BID   buPROPion   150 mg Oral Daily   Chlorhexidine  Gluconate Cloth  6 each Topical Daily   diphenhydrAMINE   25 mg Oral Once   enoxaparin  (LOVENOX ) injection  40 mg Subcutaneous Q24H   FLUoxetine   40 mg Oral Daily   fluticasone   2 spray Each Nare Daily   guaiFENesin   10 mL Oral TID   hydrocortisone  sod succinate (SOLU-CORTEF ) inj  100 mg Intravenous Q8H   hydrOXYzine   25 mg Oral TID   ipratropium-albuterol   3 mL Nebulization TID   lamoTRIgine   200 mg Oral QHS   lamoTRIgine   50 mg Oral q morning   levothyroxine   100 mcg Oral Q0600   loratadine   10 mg Oral Daily   OLANZapine   5 mg Oral QHS   pantoprazole   40 mg Oral BID   [START ON 05/22/2024] potassium chloride   10 mEq Oral Daily   rosuvastatin   10 mg Oral Daily   saccharomyces  boulardii  250 mg Oral BID   sodium chloride  flush  10-40 mL Intracatheter Q12H   sodium chloride  flush  10-40 mL Intracatheter Q12H   topiramate   150 mg Oral QHS   Continuous Infusions:  cefTRIAXone  (ROCEPHIN )  IV 2 g (05/21/24 1427)   potassium chloride  10 mEq (05/21/24 1423)   potassium PHOSPHATE  IVPB (in mmol) 30 mmol (05/21/24 0919)     LOS: 3 days    Time spent: 40 minutes   Elvan Sor, MD [facility]   To contact the attending provider between 7A-7P or the covering provider during after hours 7P-7A, please log into the web site [email or web address] and access using universal  password for that web site. If you do not have the password, please call the hospital operator.  05/21/2024, 2:36 PM

## 2024-05-21 NOTE — Plan of Care (Signed)
   Problem: Clinical Measurements: Goal: Cardiovascular complication will be avoided Outcome: Progressing   Problem: Activity: Goal: Risk for activity intolerance will decrease Outcome: Progressing   Problem: Coping: Goal: Level of anxiety will decrease Outcome: Progressing

## 2024-05-21 NOTE — Progress Notes (Signed)
 Patient was found sleeping on the floor, she states she intentionally lowered herself to the floor as the cold floor helps her calm down. Pt noted very anxious and overwhelmed.  Notified to on call NP and Rapid response. NP at the bedside to evaluate pt. Assisted pt to back in bed. NP ordered Tele monitor.

## 2024-05-21 NOTE — Progress Notes (Signed)
 I received a consult to provide support to [name].  She was uncertain about whether she wanted to talk right now, but was able to begin to name some of her spiritual distress.  I provided a safe space for her to continue to explore this distress.  Our conversation touched on grief including several miscarriages, family relationships, her history of illness and her resilience, the love she has for her children and the legacy of love that will remain after she is gone.  She seemed to be in less distress when we finished the conversation and she is hopeful to be able to go home soon.

## 2024-05-21 NOTE — TOC Progression Note (Addendum)
 Transition of Care [facility]) - Progression Note    Patient Details  Name: [name] MRN: [id number] Date of Birth: 1970-10-06  Transition of Care [facility]) CM/SW Contact  Alfonse JONELLE Rex, RN Phone Number: 05/21/2024, 2:07 PM  Clinical Narrative:   continues on iv abx, monitoring respiratory status, not medically stable for discharge. TOC will continue to follow.     Expected Discharge Plan: Home w Home Health Services Barriers to Discharge: Continued Medical Work up               Expected Discharge Plan and Services       Living arrangements for the past 2 months: Single Family Home                                       Social Drivers of Health (SDOH) Interventions SDOH Screenings   Food Insecurity: No Food Insecurity (05/19/2024)  Housing: Low Risk  (05/19/2024)  Transportation Needs: No Transportation Needs (05/19/2024)  Utilities: Not At Risk (05/19/2024)  Depression (PHQ2-9): Low Risk  (06/14/2022)  Financial Resource Strain: Medium Risk (08/21/2017)   Received from [facility]  Physical Activity: Unknown (08/21/2017)   Received from [facility]  Social Connections: Unknown (02/12/2022)   [facility] from [facility]  Stress: Stress Concern Present (08/21/2017)   Received from [facility]  Tobacco Use: Low Risk  (05/18/2024)    Readmission Risk Interventions    05/19/2024    1:45 PM 03/12/2023    9:36 PM 09/14/2022   12:19 PM  Readmission Risk Prevention Plan  Transportation Screening Complete Complete Complete  Medication Review (RN Care Manager) Complete Complete Complete  PCP or Specialist appointment within 3-5 days of discharge Complete Complete Complete  HRI or Home Care Consult Complete Complete Complete  SW Recovery Care/Counseling Consult Complete Complete Complete  Palliative Care Screening Not Applicable Not Applicable Not Applicable  Skilled Nursing Facility Not Applicable Not Applicable  Not Applicable

## 2024-05-21 NOTE — Plan of Care (Signed)
  Problem: Education: Goal: Knowledge of General Education information will improve Description: Including pain rating scale, medication(s)/side effects and non-pharmacologic comfort measures Outcome: Progressing   Problem: Health Behavior/Discharge Planning: Goal: Ability to manage health-related needs will improve Outcome: Progressing   Problem: Clinical Measurements: Goal: Ability to maintain clinical measurements within normal limits will improve Outcome: Progressing Goal: Will remain free from infection Outcome: Progressing Goal: Diagnostic test results will improve Outcome: Progressing Goal: Respiratory complications will improve Outcome: Progressing Goal: Cardiovascular complication will be avoided Outcome: Progressing   Problem: Activity: Goal: Risk for activity intolerance will decrease Outcome: Progressing   Problem: Nutrition: Goal: Adequate nutrition will be maintained Outcome: Progressing   Problem: Coping: Goal: Level of anxiety will decrease Outcome: Progressing   Problem: Elimination: Goal: Will not experience complications related to bowel motility Outcome: Progressing Goal: Will not experience complications related to urinary retention Outcome: Progressing   Problem: Pain Managment: Goal: General experience of comfort will improve and/or be controlled Outcome: Progressing   Problem: Safety: Goal: Ability to remain free from injury will improve Outcome: Progressing   Problem: Skin Integrity: Goal: Risk for impaired skin integrity will decrease Outcome: Progressing   Problem: Education: Goal: Ability to demonstrate management of disease process will improve Outcome: Progressing Goal: Ability to verbalize understanding of medication therapies will improve Outcome: Progressing Goal: Individualized Educational Video(s) Outcome: Progressing   Problem: Activity: Goal: Capacity to carry out activities will improve Outcome: Progressing    Problem: Cardiac: Goal: Ability to achieve and maintain adequate cardiopulmonary perfusion will improve Outcome: Progressing   Problem: Activity: Goal: Ability to tolerate increased activity will improve Outcome: Progressing   Problem: Clinical Measurements: Goal: Ability to maintain a body temperature in the normal range will improve Outcome: Progressing   Problem: Respiratory: Goal: Ability to maintain adequate ventilation will improve Outcome: Progressing Goal: Ability to maintain a clear airway will improve Outcome: Progressing

## 2024-05-22 ENCOUNTER — Other Ambulatory Visit: Payer: Self-pay | Admitting: Adult Health

## 2024-05-22 DIAGNOSIS — F331 Major depressive disorder, recurrent, moderate: Secondary | ICD-10-CM

## 2024-05-22 DIAGNOSIS — J9621 Acute and chronic respiratory failure with hypoxia: Secondary | ICD-10-CM | POA: Diagnosis not present

## 2024-05-22 DIAGNOSIS — F411 Generalized anxiety disorder: Secondary | ICD-10-CM

## 2024-05-22 LAB — MAGNESIUM: Magnesium: 2.3 mg/dL (ref 1.7–2.4)

## 2024-05-22 LAB — CULTURE, RESPIRATORY W GRAM STAIN

## 2024-05-22 LAB — CBC
HCT: 32.9 % — ABNORMAL LOW (ref 36.0–46.0)
Hemoglobin: 9.6 g/dL — ABNORMAL LOW (ref 12.0–15.0)
MCH: 23.6 pg — ABNORMAL LOW (ref 26.0–34.0)
MCHC: 29.2 g/dL — ABNORMAL LOW (ref 30.0–36.0)
MCV: 81 fL (ref 80.0–100.0)
Platelets: 415 K/uL — ABNORMAL HIGH (ref 150–400)
RBC: 4.06 MIL/uL (ref 3.87–5.11)
RDW: 21 % — ABNORMAL HIGH (ref 11.5–15.5)
WBC: 10.1 K/uL (ref 4.0–10.5)
nRBC: 0.2 % (ref 0.0–0.2)

## 2024-05-22 LAB — BASIC METABOLIC PANEL WITH GFR
Anion gap: 10 (ref 5–15)
BUN: 26 mg/dL — ABNORMAL HIGH (ref 6–20)
CO2: 24 mmol/L (ref 22–32)
Calcium: 8.7 mg/dL — ABNORMAL LOW (ref 8.9–10.3)
Chloride: 101 mmol/L (ref 98–111)
Creatinine, Ser: 0.89 mg/dL (ref 0.44–1.00)
GFR, Estimated: >60 mL/min (ref >60)
Glucose, Bld: 128 mg/dL — ABNORMAL HIGH (ref 70–99)
Potassium: 3.8 mmol/L (ref 3.5–5.1)
Sodium: 135 mmol/L (ref 135–145)

## 2024-05-22 LAB — PHOSPHORUS: Phosphorus: 3 mg/dL (ref 2.5–4.6)

## 2024-05-22 MED ORDER — HYDROCORTISONE 20 MG PO TABS: ORAL_TABLET | ORAL | 0 refills | Status: AC

## 2024-05-22 MED ORDER — HYDROCORTISONE 10 MG PO TABS: ORAL_TABLET | ORAL | 0 refills | Status: AC

## 2024-05-22 MED ORDER — ORAL CARE MOUTH RINSE
15.0000 mL | OROMUCOSAL | Status: DC | PRN
Start: 2024-05-22 — End: 2024-05-22

## 2024-05-22 MED ORDER — AMOXICILLIN-POT CLAVULANATE 600-42.9 MG/5ML PO SUSR: 600.0000 mg | Freq: Two times a day (BID) | ORAL | 0 refills | Status: AC

## 2024-05-22 MED ORDER — HYDROXYZINE HCL 10 MG/5ML PO SYRP: 25.0000 mg | ORAL_SOLUTION | Freq: Three times a day (TID) | ORAL | 0 refills | Status: DC

## 2024-05-22 MED ORDER — POTASSIUM CHLORIDE 20 MEQ PO PACK: 20.0000 meq | PACK | Freq: Every day | ORAL | 11 refills | Status: DC

## 2024-05-22 MED ORDER — HYOSCYAMINE SULFATE 0.125 MG SL SUBL: 0.1250 mg | SUBLINGUAL_TABLET | Freq: Four times a day (QID) | SUBLINGUAL | 1 refills | Status: DC | PRN

## 2024-05-22 MED ORDER — GUAIFENESIN 100 MG/5ML PO LIQD: 10.0000 mL | Freq: Four times a day (QID) | ORAL | 0 refills | Status: DC | PRN

## 2024-05-22 NOTE — Plan of Care (Signed)
  Problem: Health Behavior/Discharge Planning: Goal: Ability to manage health-related needs will improve Outcome: Progressing   Problem: Clinical Measurements: Goal: Diagnostic test results will improve Outcome: Progressing   Problem: Coping: Goal: Level of anxiety will decrease Outcome: Not Progressing

## 2024-05-22 NOTE — Discharge Summary (Signed)
 [facility] Discharge Summary   Patient: [name] FMW:[id number]  PCP: Toppin, Jean M, MD  Date of admission: 05/18/2024   Date of discharge:  05/22/2024     Discharge Diagnoses:  Principal Problem:   Acute on chronic respiratory failure with hypoxia (HCC) Active Problems:   CAP (community acquired pneumonia)   Hypokalemia   Grade I diastolic dysfunction   Addison's disease (HCC)   Generalized anxiety disorder/depression   Hx of Hodgkins lymphoma   Anxiety and depression   Postablative hypothyroidism   CKD stage 3a, GFR 45-59 ml/min (HCC)   Pulmonary arterial hypertension (HCC)   Pneumonia   Hypophosphatemia   Admitted From: Home Disposition:  Home   Recommendations for Outpatient Follow-up:  Follow-up with PCP in 1 week,  Follow-up with GI as per schedule Follow-up with oncologist as per schedule. Follow up LABS/TEST:  repeat CBC and BMP after 1 week.   Follow-up Information     Toppin, [name], MD Follow up in 1 week(s).   Specialty: Internal Medicine Contact information: [address] [place] [address] [phone]                Diet recommendation: Cardiac diet  Activity: The patient is advised to gradually reintroduce usual activities, as tolerated  Discharge Condition: stable  Code Status: Full code   History of present illness: As per the H and P dictated on admission.  Hospital Course:  Patient 53 year old female history of Hodgkin's lymphoma, pulmonary artery hypertension, CHF, Addison's disease, migraines presenting with a 2-day history of fevers chills cough sputum production and shortness of breath.  Noted to have increased O2 requirements of 8 L nasal cannula on admission.  Imaging done concerning for multifocal pneumonia.  Patient admitted and placed empirically on IV antibiotics.     Assessment & Plan:  # Acute on chronic respiratory failure secondary to multifocal community-acquired  pneumonia/rhinovirus/enterovirus infection - Patient presented with respiratory symptoms, noted to have a leukocytosis, increased O2 requirements from baseline. - Chest x-ray done on admission with interval increase in ground glass opacities involving bilateral perihilar and lower lobes which may represent edema versus airspace consolidation. - CT chest done with multifocal bilateral airspace disease most pronounced within the lower lobes appearance most consistent with multifocal bilateral bronchopneumonia.  Borderline enlarged mediastinal lymph nodes likely reactive. - Patient with some improvement with hypoxia and down to 5 L nasal cannula which she states is her baseline. Sputum Gram stain and culture  -  SARS coronavirus 2 PCR negative, - Influenza A and B by PCR negative. - RSV by PCR negative. - Respiratory viral panel was ordered this morning which is positive for the rhinovirus. - s/p IV Cefepime  and Azithromycin . - Discontinue Qvar , Breo elliptica, Incruse. - Place on Pulmicort , scheduled DuoNebs, Claritin , Flonase , PPI. - s/p Mucinex  to 1200 mg BID, decreased to Mucinex  600 liquid BID 8/20 procalcitonin elevated.  Pharmacy suggested to de-escalate antibiotics, started ceftriaxone  and discontinued cefepime . 8/22 patient was discharged on Augmentin  twice daily x 4 days     # Hypokalemia: Potassium repleted.  Prescribed potassium chloride  20 mEq p.o. daily because patient was on Bumex  at home. # Hypophosphatemia: Phos repleted and resolved.  #  Anemia: Patient with no overt bleeding. S/p Transfused 2 units PRBCs.  Possible secondary to acute illness.  H&H stable, follow with PCP and repeat CBC after 1 week. Iron deficiency, TSAT 3%.  Patient has allergy to iron supplement.  Patient was advised to try Niferex (iron  polysaccharide) recommend to follow with PCP for further management of iron supplementation. Try to eat more vegetables containing iron.  # Addison's disease: s/p  Hydrocortisone  100 mg IV q8 given during hospital stay.  At home patient was on 20 mg in a.m. and 10 mg in the p.m.  Patient requested slow taper as per her previous experience. Patient was discharged on tapering dose hydrocortisone  50 mg pod in am and 25 mg pod in the evening x 2 days, 40 mg in am and 20 mg in the evening x 2 days, 30 mg pod in am and 15 mg pod in the evening x 2 days and then back to her home dose.   # Grade 1 diastolic dysfunction: Continued Bumex  home dose.  Prescribed potassium chloride  20 mEq p.o. daily   # Postablative hypothyroidism: Continue Synthroid .  # CKD stage 2, sCr 0.89 and eGFR >60 on 05/22/24  # Pulmonary artery hypertension: Outpatient follow-up.   # Depression/anxiety:  -Continue home regimen Zyprexa , Lamictal , Prozac , Wellbutrin . Started hydroxyzine  25 mg p.o. 3 times daily for anxiety  # GERD: Increased pantoprazole , started IV 40 mg twice daily instead of oral because of nausea and vomiting.    # Hypocalcemia due to hypoproteinemia, vitamin D  level within normal range    Body mass index is 32.65 kg/m.  Nutrition Interventions:  - Patient was instructed, not to drive, operate heavy machinery, perform activities at heights, swimming or participation in water  activities or provide baby sitting services while on Pain, Sleep and Anxiety Medications; until her outpatient Physician has advised to do so again.  - Also recommended to not to take more than prescribed Pain, Sleep and Anxiety Medications.  Patient was ambulatory without any assistance. On the day of the discharge the patient's vitals were stable, and no other acute medical condition were reported by patient. the patient was felt safe to be discharge at Home.  Consultants: None Procedures: None  Discharge Exam: General: Appear in no distress, no Rash; Oral Mucosa Clear, moist. Cardiovascular: S1 and S2 Present, no Murmur, Respiratory: normal respiratory effort, Bilateral Air entry present  and no Crackles, no wheezes Abdomen: Bowel Sound present, Soft and no tenderness, no hernia Extremities: no Pedal edema, no calf tenderness Neurology: alert and oriented to time, place, and person affect appropriate.  Filed Weights   05/19/24 0136 05/20/24 0500 05/21/24 0500  Weight: 76.2 kg 88.7 kg 89 kg   Vitals:   05/22/24 0555 05/22/24 0940  BP: (!) 140/81   Pulse: 82   Resp: 18   Temp: 98 F (36.7 C)   SpO2: 100% 99%    DISCHARGE MEDICATION: Allergies as of 05/22/2024       Reactions   Ferrous Bisglycinate Chelate [iron] Anaphylaxis, Other (See Comments)   Only IV - only FERRLICET   Mushroom Extract Complex (obsolete) Anaphylaxis   Na Ferric Gluc Cplx In Sucrose Anaphylaxis   Cymbalta [duloxetine Hcl] Swelling, Anxiety   Hydromorphone  Other (See Comments)   BP drop and heart rate drops 5.1.20 PT REPORTS THAT SHE TAKES DILAUDID  AT HOME   Morphine Other (See Comments)   I clawed out my IV(s)   Ondansetron  Hcl Itching, Other (See Comments)   Clawed off my skin   Promethazine Other (See Comments)   Made me to clinch up and my arms shook   Succinylcholine Other (See Comments)   Lock Jaw   Buprenorphine Hcl Hives   Compazine Other (See Comments)   Altered mental status Aggression   Duloxetine  Metoclopramide Other (See Comments)   Dystonia   Morphine And Codeine Hives   Ondansetron  Hives, Rash   Other reaction(s): Unknown Other reaction(s): Unknown   Promethazine Hcl Hives   Tegaderm Ag Mesh [silver ] Rash, Other (See Comments)   Old formulation only, is able to tolerate new formulation        Medication List     PAUSE taking these medications    hydrocortisone  10 MG tablet Wait to take this until: May 28, 2024 Commonly known as: CORTEF  Take 1-2 tablets (10-20 mg total) by mouth See admin instructions. Take 20 mg in the am and 10mg  in the evening. Take after completing prednisone  You also have another medication with the same name that you  may need to continue taking. What changed: additional instructions       STOP taking these medications    ALPRAZolam  1 MG tablet Commonly known as: XANAX    amoxicillin -clavulanate 875-125 MG tablet Commonly known as: AUGMENTIN  Replaced by: amoxicillin -clavulanate 600-42.9 MG/5ML suspension   diclofenac  50 MG EC tablet Commonly known as: VOLTAREN    doxycycline  100 MG capsule Commonly known as: VIBRAMYCIN    Emgality  120 MG/ML Sosy Generic drug: Galcanezumab -gnlm   Entresto  24-26 MG Generic drug: sacubitril -valsartan    LORazepam  0.5 MG tablet Commonly known as: Ativan        TAKE these medications    albuterol  (2.5 MG/3ML) 0.083% nebulizer solution Commonly known as: PROVENTIL  INHALE 3 ML BY NEBULIZATION EVERY 6 HOURS AS NEEDED FOR WHEEZING OR SHORTNESS OF BREATH   albuterol  108 (90 Base) MCG/ACT inhaler Commonly known as: VENTOLIN  HFA Inhale 2 puffs into the lungs every 6 (six) hours as needed for wheezing or shortness of breath.   amoxicillin -clavulanate 600-42.9 MG/5ML suspension Commonly known as: AUGMENTIN  Take 5 mLs (600 mg total) by mouth 2 (two) times daily for 4 days. Replaces: amoxicillin -clavulanate 875-125 MG tablet   aspirin  EC 81 MG tablet Take 1 tablet (81 mg total) by mouth daily. Swallow whole. What changed: additional instructions   Breo Ellipta  100-25 MCG/ACT Aepb Generic drug: fluticasone  furoate-vilanterol Inhale 2 puffs into the lungs daily.   bumetanide  1 MG tablet Commonly known as: BUMEX  Take 1 tablet (1 mg total) by mouth 2 (two) times daily.   buPROPion  150 MG 24 hr tablet Commonly known as: WELLBUTRIN  XL TAKE 1 TABLET BY MOUTH EVERY DAY   clonazePAM  1 MG tablet Commonly known as: KLONOPIN  TAKE 1 TABLET BY MOUTH 3 TIMES A DAY AS NEEDED AND 1 EXTRA FOR SEVERE ANXIETY UP TO 10 A MONTH. What changed:  how much to take how to take this when to take this additional instructions   dexlansoprazole  60 MG capsule Commonly known  as: DEXILANT  Take 1 capsule (60 mg total) by mouth daily.   diphenhydrAMINE  50 MG/ML injection Commonly known as: BENADRYL  Inject 25 mg into the vein 5 (five) times daily as needed (for nausea  (is 50 mg/ml)).   EPINEPHrine  0.3 mg/0.3 mL Soaj injection Commonly known as: EPI-PEN INJECT 0.3 MG INTO THE MUSCLE AS NEEDED FOR ANAPHYLAXIS.   famotidine  40 MG/5ML suspension Commonly known as: PEPCID  Take 2.5 mLs (20 mg total) by mouth daily. What changed:  when to take this reasons to take this   Florastor 250 MG capsule Generic drug: saccharomyces boulardii Take 250 mg by mouth 2 (two) times daily.   FLUoxetine  40 MG capsule Commonly known as: PROZAC  Take 1 capsule (40 mg total) by mouth daily.   fluticasone -salmeterol 230-21 MCG/ACT inhaler Commonly known as: ADVAIR HFA Inhale  2 puffs into the lungs 2 (two) times daily. What changed:  when to take this reasons to take this   guaiFENesin  100 MG/5ML liquid Commonly known as: ROBITUSSIN Take 10 mLs by mouth every 6 (six) hours as needed for cough or to loosen phlegm.   Heparin  Na (Pork) Lock Flsh PF 100 UNIT/ML Soln Commonly known as: BD Heparin  PosiFlush USE 5 MLS IN PORT A CATH ONCE DAILY AFTER MEDICATION ADMINISTRATION AS A HEPLOCK AS DIRECTED.   hydrocortisone  20 MG tablet Commonly known as: Cortef  Take 2.5 tablets (50 mg total) by mouth daily for 2 days, THEN 2 tablets (40 mg total) daily for 2 days, THEN 1.5 tablets (30 mg total) daily for 2 days. Start taking on: May 22, 2024 What changed: Another medication with the same name was paused. Ask your nurse or doctor if you should take this medication.   hydrocortisone  10 MG tablet Commonly known as: Cortef  Take 2.5 tablets (25 mg total) by mouth every evening for 2 days, THEN 2 tablets (20 mg total) every evening for 2 days, THEN 1.5 tablets (15 mg total) every evening for 2 days. Start taking on: May 22, 2024 What changed: Another medication with the same name  was paused. Ask your nurse or doctor if you should take this medication.   hydrOXYzine  10 MG/5ML syrup Commonly known as: ATARAX  Take 12.5 mLs (25 mg total) by mouth 3 (three) times daily.   hyoscyamine  0.125 MG SL tablet Commonly known as: LEVSIN  SL Place 1 tablet (0.125 mg total) under the tongue every 6 (six) hours as needed for cramping (esophageal spasm). Up to 1.25 mg daily What changed: reasons to take this   ipratropium-albuterol  0.5-2.5 (3) MG/3ML Soln Commonly known as: DUONEB Take 3 mLs by nebulization every 6 (six) hours as needed (for shortness of breath or wheezing).   lamoTRIgine  200 MG tablet Commonly known as: LAMICTAL  TAKE 1 TABLET BY MOUTH EVERYDAY AT BEDTIME What changed: Another medication with the same name was changed. Make sure you understand how and when to take each.   lamoTRIgine  25 MG tablet Commonly known as: LAMICTAL  Take two tablets every morning. What changed:  how much to take how to take this when to take this additional instructions   lidocaine  2 % solution Commonly known as: XYLOCAINE  Use as directed 10 mLs in the mouth or throat every 4 (four) hours as needed (for esophageal pain and pain with swallowing.).   nitroGLYCERIN  0.4 MG SL tablet Commonly known as: NITROSTAT  DISSOLVE 1 TAB UNDER THE TONGUE EVERY 5 MINUTES AS NEEDED FOR CHEST PAIN. MAX OF 3 DOSES, THEN 911.   Normal Saline Flush 0.9 % Soln Commonly known as: BD PosiFlush Use as directed for catheter maintenance   Normal Saline Flush 0.9 % Soln Use 10 mLs as directed for catheter maintenance.   Nurtec 75 MG Tbdp Generic drug: Rimegepant Sulfate  Take 75 mg by mouth daily as needed (migraines).   OLANZapine  5 MG tablet Commonly known as: ZYPREXA  Take 1 tablet (5 mg total) by mouth at bedtime.   OXYGEN  Inhale 5 L/min into the lungs continuous.   potassium chloride  20 MEQ packet Commonly known as: KLOR-CON  Take 20 mEq by mouth daily. Can be mixed in the juice for better  test   Qvar  RediHaler 80 MCG/ACT inhaler Generic drug: beclomethasone Inhale 2 puffs into the lungs daily.   rosuvastatin  10 MG tablet Commonly known as: CRESTOR  Take 10 mg by mouth in the morning. Mid morning   Spiriva   Respimat 1.25 MCG/ACT Aers Generic drug: Tiotropium Bromide  Monohydrate Inhale 2 puffs into the lungs daily.   sucralfate  1 GM/10ML suspension Commonly known as: CARAFATE  Take 1 g by mouth daily as needed (as directed for ulcers).   Synthroid  100 MCG tablet Generic drug: levothyroxine  Take 1 tablet (100 mcg total) by mouth every morning. What changed: additional instructions   topiramate  50 MG tablet Commonly known as: TOPAMAX  Take 150 mg by mouth at bedtime.   Xeroform Occlusive Gauze Strip Pads Apply 1 each topically as directed.   zolpidem  12.5 MG CR tablet Commonly known as: AMBIEN  CR TAKE ONE TABLET BY MOUTH AT BEDTIME AS NEEDED FOR SLEEP. What changed: when to take this               Discharge Care Instructions  (From admission, onward)           Start     Ordered   05/22/24 0000  Discharge wound care:       Comments: As above   05/22/24 1327           Allergies  Allergen Reactions   Ferrous Bisglycinate Chelate [Iron] Anaphylaxis and Other (See Comments)    Only IV - only FERRLICET   Mushroom Extract Complex (Obsolete) Anaphylaxis   Na Ferric Gluc Cplx In Sucrose Anaphylaxis   Cymbalta [Duloxetine Hcl] Swelling and Anxiety   Hydromorphone  Other (See Comments)    BP drop and heart rate drops 5.1.20 PT REPORTS THAT SHE TAKES DILAUDID  AT HOME   Morphine Other (See Comments)    I clawed out my IV(s)   Ondansetron  Hcl Itching and Other (See Comments)    Clawed off my skin   Promethazine Other (See Comments)    Made me to clinch up and my arms shook   Succinylcholine Other (See Comments)    Lock Jaw   Buprenorphine Hcl Hives   Compazine Other (See Comments)    Altered mental status Aggression   Duloxetine     Metoclopramide Other (See Comments)    Dystonia   Morphine And Codeine Hives   Ondansetron  Hives and Rash    Other reaction(s): Unknown Other reaction(s): Unknown   Promethazine Hcl Hives   Tegaderm Ag Mesh [Silver ] Rash and Other (See Comments)    Old formulation only, is able to tolerate new formulation   Discharge Instructions     Call MD for:  difficulty breathing, headache or visual disturbances   Complete by: As directed    Call MD for:  extreme fatigue   Complete by: As directed    Call MD for:  persistant dizziness or light-headedness   Complete by: As directed    Call MD for:  persistant nausea and vomiting   Complete by: As directed    Call MD for:  redness, tenderness, or signs of infection (pain, swelling, redness, odor or green/yellow discharge around incision site)   Complete by: As directed    Call MD for:  severe uncontrolled pain   Complete by: As directed    Call MD for:  temperature >100.4   Complete by: As directed    Diet general   Complete by: As directed    Discharge instructions   Complete by: As directed    Follow-up with PCP in 1 week, repeat CBC and BMP after 1 week. Follow-up with GI as per schedule Follow-up with oncologist as per schedule.   Discharge wound care:   Complete by: As directed    As above  Increase activity slowly   Complete by: As directed        The results of significant diagnostics from this hospitalization (including imaging, microbiology, ancillary and laboratory) are listed below for reference.    Significant Diagnostic Studies: CT Chest W Contrast Result Date: 05/18/2024 CLINICAL DATA:  Increased shortness of breath since yesterday, chest pain, cough EXAM: CT CHEST WITH CONTRAST TECHNIQUE: Multidetector CT imaging of the chest was performed during intravenous contrast administration. RADIATION DOSE REDUCTION: This exam was performed according to the departmental dose-optimization program which includes automated  exposure control, adjustment of the mA and/or kV according to patient size and/or use of iterative reconstruction technique. CONTRAST:  75mL OMNIPAQUE  IOHEXOL  300 MG/ML  SOLN COMPARISON:  05/18/2024, 01/23/2023 FINDINGS: Cardiovascular: The heart is unremarkable without pericardial effusion. Calcifications of the aortic valve are noted. No evidence of thoracic aortic aneurysm or dissection. Atherosclerosis of the aorta and coronary vasculature. Mediastinum/Nodes: Borderline enlarged mediastinal lymph nodes, measuring up to 11 mm in the right paratracheal region and 10 mm in the subcarinal region. Trachea and esophagus are unremarkable. Lungs/Pleura: Multifocal bilateral airspace disease within the right upper lobe and most pronounced within the bilateral lower lobes. No effusion or pneumothorax. The central airways are patent. Upper Abdomen: No acute abnormality.  Bilateral breast prostheses. Musculoskeletal: Left-sided PICC tip within the superior vena cava. No acute or destructive bony abnormalities. Reconstructed images demonstrate no additional findings. IMPRESSION: 1. Multifocal bilateral airspace disease, most pronounced within the lower lobes, with an appearance most consistent with multifocal bilateral bronchopneumonia. 2. Borderline enlarged mediastinal lymph nodes, likely reactive. 3. Aortic Atherosclerosis (ICD10-I70.0). Coronary artery atherosclerosis. Electronically Signed   By: Ozell Daring M.D.   On: 05/18/2024 18:57   DG Chest 2 View Result Date: 05/18/2024 CLINICAL DATA:  Chest pain and worsening cough. EXAM: CHEST - 2 VIEW COMPARISON:  Chest radiograph December 22, 2023. FINDINGS: Interval increase in bilateral ground-glass opacities in predominantly involving bilateral perihilar and lower hemi thoraces. Left PICC line tip terminates in distal SVC. Interval removal of right central line. The heart size is normal. Bilateral surgical clips identified in right breast/axilla. Trace pleural effusion  on the right. No acute osseous lesion. IMPRESSION: Interval increase in ground-glass opacities involving bilateral perihilar and lower lobes which may represent edema versus airspace consolidations. Recommend chest CT for further assessment if clinically warranted. Electronically Signed   By: Megan  Zare M.D.   On: 05/18/2024 15:11    Microbiology: Recent Results (from the past 240 hours)  Respiratory (~20 pathogens) panel by PCR     Status: Abnormal   Collection Time: 05/18/24  9:42 AM   Specimen: Nasopharyngeal Swab; Respiratory  Result Value Ref Range Status   Adenovirus NOT DETECTED NOT DETECTED Final   Coronavirus 229E NOT DETECTED NOT DETECTED Final    Comment: (NOTE) The Coronavirus on the Respiratory Panel, DOES NOT test for the novel  Coronavirus (2019 nCoV)    Coronavirus HKU1 NOT DETECTED NOT DETECTED Final   Coronavirus NL63 NOT DETECTED NOT DETECTED Final   Coronavirus OC43 NOT DETECTED NOT DETECTED Final   Metapneumovirus NOT DETECTED NOT DETECTED Final   Rhinovirus / Enterovirus DETECTED (A) NOT DETECTED Final   Influenza A NOT DETECTED NOT DETECTED Final   Influenza B NOT DETECTED NOT DETECTED Final   Parainfluenza Virus 1 NOT DETECTED NOT DETECTED Final   Parainfluenza Virus 2 NOT DETECTED NOT DETECTED Final   Parainfluenza Virus 3 NOT DETECTED NOT DETECTED Final   Parainfluenza Virus 4 NOT DETECTED NOT  DETECTED Final   Respiratory Syncytial Virus NOT DETECTED NOT DETECTED Final   Bordetella pertussis NOT DETECTED NOT DETECTED Final   Bordetella Parapertussis NOT DETECTED NOT DETECTED Final   Chlamydophila pneumoniae NOT DETECTED NOT DETECTED Final   Mycoplasma pneumoniae NOT DETECTED NOT DETECTED Final    Comment: Performed at [facility], [address] [place], [place] [address]  Resp panel by RT-PCR (RSV, Flu A&B, Covid) Anterior Nasal Swab     Status: None   Collection Time: 05/18/24  2:05 PM   Specimen: Anterior Nasal Swab  Result Value Ref Range  Status   SARS Coronavirus 2 by RT PCR NEGATIVE NEGATIVE Final    Comment: (NOTE) SARS-CoV-2 target nucleic acids are NOT DETECTED.  The SARS-CoV-2 RNA is generally detectable in upper respiratory specimens during the acute phase of infection. The lowest concentration of SARS-CoV-2 viral copies this assay can detect is 138 copies/mL. A negative result does not preclude SARS-Cov-2 infection and should not be used as the sole basis for treatment or other patient management decisions. A negative result may occur with  improper specimen collection/handling, submission of specimen other than nasopharyngeal swab, presence of viral mutation(s) within the areas targeted by this assay, and inadequate number of viral copies(<138 copies/mL). A negative result must be combined with clinical observations, patient history, and epidemiological information. The expected result is Negative.  Fact Sheet for Patients:  BloggerCourse.com  Fact Sheet for Healthcare Providers:  SeriousBroker.it  This test is no t yet approved or cleared by the [place]  FDA and  has been authorized for detection and/or diagnosis of SARS-CoV-2 by FDA under an Emergency Use Authorization (EUA). This EUA will remain  in effect (meaning this test can be used) for the duration of the COVID-19 declaration under Section 564(b)(1) of the Act, 21 U.S.C.section 360bbb-3(b)(1), unless the authorization is terminated  or revoked sooner.       Influenza A by PCR NEGATIVE NEGATIVE Final   Influenza B by PCR NEGATIVE NEGATIVE Final    Comment: (NOTE) The Xpert Xpress SARS-CoV-2/FLU/RSV plus assay is intended as an aid in the diagnosis of influenza from Nasopharyngeal swab specimens and should not be used as a sole basis for treatment. Nasal washings and aspirates are unacceptable for Xpert Xpress SARS-CoV-2/FLU/RSV testing.  Fact Sheet for  Patients: BloggerCourse.com  Fact Sheet for Healthcare Providers: SeriousBroker.it  This test is not yet approved or cleared by the [place]  FDA and has been authorized for detection and/or diagnosis of SARS-CoV-2 by FDA under an Emergency Use Authorization (EUA). This EUA will remain in effect (meaning this test can be used) for the duration of the COVID-19 declaration under Section 564(b)(1) of the Act, 21 U.S.C. section 360bbb-3(b)(1), unless the authorization is terminated or revoked.     Resp Syncytial Virus by PCR NEGATIVE NEGATIVE Final    Comment: (NOTE) Fact Sheet for Patients: BloggerCourse.com  Fact Sheet for Healthcare Providers: SeriousBroker.it  This test is not yet approved or cleared by the [place]  FDA and has been authorized for detection and/or diagnosis of SARS-CoV-2 by FDA under an Emergency Use Authorization (EUA). This EUA will remain in effect (meaning this test can be used) for the duration of the COVID-19 declaration under Section 564(b)(1) of the Act, 21 U.S.C. section 360bbb-3(b)(1), unless the authorization is terminated or revoked.  Performed at [facility] [facility], [address] [place], [place] [address]   Group A Strep by PCR     Status: None  Collection Time: 05/18/24  5:21 PM   Specimen: Throat; Sterile Swab  Result Value Ref Range Status   Group A Strep by PCR NOT DETECTED NOT DETECTED Final    Comment: Performed at [facility], [address], [place], [place] [address]  Expectorated Sputum Assessment w Gram Stain, Rflx to Resp Cult     Status: None   Collection Time: 05/19/24  9:13 AM   Specimen: Sputum  Result Value Ref Range Status   Specimen Description SPUTUM  Final   Special Requests NONE  Final   Sputum evaluation   Final    Sputum specimen not acceptable for testing.  Please recollect.    NOTIFIED BROCKS E. Performed at [facility], [address] [place], [place] [address]    Report Status 05/19/2024 FINAL  Final  Culture, blood (routine x 2) Call MD if unable to obtain prior to antibiotics being given     Status: None (Preliminary result)   Collection Time: 05/19/24 10:27 AM   Specimen: BLOOD  Result Value Ref Range Status   Specimen Description   Final    BLOOD BLOOD RIGHT HAND AEROBIC BOTTLE ONLY Performed at [facility], [address] [place], [place] [address]    Special Requests   Final    BOTTLES DRAWN AEROBIC ONLY Blood Culture results may not be optimal due to an inadequate volume of blood received in culture bottles Performed at [facility], [address], [place], [place] [address]    Culture   Final    NO GROWTH 3 DAYS Performed at [facility], [address] [place], [place] [address]    Report Status PENDING  Incomplete  Culture, blood (Routine X 2) w Reflex to ID Panel     Status: None (Preliminary result)   Collection Time: 05/19/24 10:27 AM   Specimen: BLOOD RIGHT ARM  Result Value Ref Range Status   Specimen Description   Final    BLOOD RIGHT ARM AEROBIC BOTTLE ONLY Performed at [facility], [address] [place], [place] [address]    Special Requests   Final    BOTTLES DRAWN AEROBIC ONLY Blood Culture results may not be optimal due to an inadequate volume of blood received in culture bottles Performed at [facility], [address], [place], [place] [address]    Culture   Final    NO GROWTH 3 DAYS Performed at [facility], [address], [place], [place] [address]    Report Status PENDING  Incomplete  Expectorated Sputum Assessment w Gram Stain, Rflx to Resp Cult     Status: None   Collection Time: 05/19/24 11:47 AM   Specimen: Expectorated Sputum  Result Value Ref Range Status   Specimen Description EXPECTORATED  SPUTUM  Final   Special Requests NONE  Final   Sputum evaluation   Final    THIS SPECIMEN IS ACCEPTABLE FOR SPUTUM CULTURE Performed at [facility], [address], [place], [place] [address]    Report Status 05/19/2024 FINAL  Final  Culture, Respiratory w Gram Stain     Status: None (Preliminary result)   Collection Time: 05/19/24 11:47 AM  Result Value Ref Range Status   Specimen Description   Final    EXPECTORATED SPUTUM Performed at [facility], [address] [place], [place] [address]    Special Requests   Final    NONE Reflexed from  [id number] Performed at [facility], [address], [place], [place] [address]    Gram Stain   Final    WBC PRESENT, PREDOMINANTLY MONONUCLEAR RARE GRAM POSITIVE RODS RARE BUDDING YEAST SEEN RARE GRAM POSITIVE COCCI IN PAIRS    Culture   Final    CULTURE REINCUBATED FOR BETTER GROWTH Performed at [facility], [address] [place], [place] [address]    Report Status PENDING  Incomplete     Labs: CBC: Recent Labs  Lab 05/18/24 1405 05/19/24 0108 05/19/24 0850 05/19/24 1633 05/20/24 0241 05/21/24 0231 05/22/24 0347  WBC 16.7* 15.0* 14.3*  --  12.0* 11.9* 10.1  NEUTROABS 14.0*  --   --   --  10.7*  --   --   HGB 7.9* 7.0* 6.8* 7.6* 8.7* 8.9* 9.6*  HCT 27.2* 24.6* 24.3* 25.7* 28.6* 30.1* 32.9*  MCV 74.7* 75.0* 76.4*  --  79.7* 80.5 81.0  PLT 365 312 301  --  302 346 415*   Basic Metabolic Panel: Recent Labs  Lab 05/18/24 1405 05/19/24 0108 05/19/24 0850 05/20/24 0241 05/20/24 0500 05/21/24 0231 05/22/24 0347  NA 139  --  137  137  --  137 141 135  K 3.0*  --  3.3*  3.3*  --  2.8* 3.1* 3.8  CL 105  --  102  102  --  102 105 101  CO2 24  --  22  22  --  23 24 24   GLUCOSE 121*  --  231*  227*  --  168* 158* 128*  BUN 28*  --  25*  25*  --  23* 28* 26*  CREATININE 1.35* 1.17* 1.32*  1.32*  --  1.01* 1.04* 0.89  CALCIUM  9.1  --  8.4*  8.4*  --  7.7*  8.0* 8.7*  MG 1.9  --  2.0 2.1  --  2.1 2.3  PHOS  --   --  2.1*  --  3.0 2.1* 3.0   Liver Function Tests: Recent Labs  Lab 05/18/24 1405 05/19/24 0850 05/20/24 0500  AST 19  --   --   ALT 19  --   --   ALKPHOS 50  --   --   BILITOT 0.6  --   --   PROT 6.9  --   --   ALBUMIN 3.7 3.2* 3.1*   No results for input(s): LIPASE, AMYLASE in the last 168 hours. No results for input(s): AMMONIA in the last 168 hours. Cardiac Enzymes: No results for input(s): CKTOTAL, CKMB, CKMBINDEX, TROPONINI in the last 168 hours. BNP (last 3 results) Recent Labs    07/26/23 1433 09/03/23 1315 05/18/24 1406  BNP 53.8 43.3 93.9   CBG: No results for input(s): GLUCAP in the last 168 hours.  Time spent: 35 minutes  Signed:  Elvan Sor  [facility] 05/22/2024 1:27 PM

## 2024-05-24 ENCOUNTER — Other Ambulatory Visit: Payer: Self-pay | Admitting: Adult Health

## 2024-05-24 DIAGNOSIS — F411 Generalized anxiety disorder: Secondary | ICD-10-CM

## 2024-05-24 DIAGNOSIS — F331 Major depressive disorder, recurrent, moderate: Secondary | ICD-10-CM

## 2024-05-24 LAB — CULTURE, BLOOD (ROUTINE X 2)
Culture: NO GROWTH
Culture: NO GROWTH

## 2024-05-25 ENCOUNTER — Telehealth (INDEPENDENT_AMBULATORY_CARE_PROVIDER_SITE_OTHER): Admitting: Adult Health

## 2024-05-25 ENCOUNTER — Other Ambulatory Visit (HOSPITAL_COMMUNITY): Payer: Self-pay

## 2024-05-25 ENCOUNTER — Telehealth: Payer: Self-pay | Admitting: Gastroenterology

## 2024-05-25 ENCOUNTER — Encounter: Payer: Self-pay | Admitting: Adult Health

## 2024-05-25 DIAGNOSIS — F411 Generalized anxiety disorder: Secondary | ICD-10-CM | POA: Diagnosis not present

## 2024-05-25 DIAGNOSIS — G4700 Insomnia, unspecified: Secondary | ICD-10-CM | POA: Diagnosis not present

## 2024-05-25 DIAGNOSIS — F410 Panic disorder [episodic paroxysmal anxiety] without agoraphobia: Secondary | ICD-10-CM

## 2024-05-25 DIAGNOSIS — F331 Major depressive disorder, recurrent, moderate: Secondary | ICD-10-CM

## 2024-05-25 MED ORDER — BUPROPION HCL ER (XL) 150 MG PO TB24
150.0000 mg | ORAL_TABLET | Freq: Every day | ORAL | 2 refills | Status: DC
Start: 2024-05-25 — End: 2024-06-25

## 2024-05-25 MED ORDER — LAMOTRIGINE 200 MG PO TABS: ORAL_TABLET | ORAL | 2 refills | Status: DC

## 2024-05-25 MED ORDER — OLANZAPINE 5 MG PO TABS: 5.0000 mg | ORAL_TABLET | Freq: Every day | ORAL | 2 refills | Status: DC

## 2024-05-25 MED ORDER — CLONAZEPAM 1 MG PO TABS: ORAL_TABLET | ORAL | 0 refills | Status: DC

## 2024-05-25 MED ORDER — FLUOXETINE HCL 40 MG PO CAPS: 40.0000 mg | ORAL_CAPSULE | Freq: Every day | ORAL | 2 refills | Status: DC

## 2024-05-25 MED ORDER — LAMOTRIGINE 25 MG PO TABS
ORAL_TABLET | ORAL | 2 refills | Status: DC
Start: 2024-05-25 — End: 2024-06-28

## 2024-05-25 NOTE — Progress Notes (Signed)
 [name] [id number] 01/02/1971 53 y.o.  Virtual Visit via Video Note  I connected with pt @ on 05/25/24 at  4:00 PM EDT by a video enabled telemedicine application and verified that I am speaking with the correct person using two identifiers.   I discussed the limitations of evaluation and management by telemedicine and the availability of in person appointments. The patient expressed understanding and agreed to proceed.  I discussed the assessment and treatment plan with the patient. The patient was provided an opportunity to ask questions and all were answered. The patient agreed with the plan and demonstrated an understanding of the instructions.   The patient was advised to call back or seek an in-person evaluation if the symptoms worsen or if the condition fails to improve as anticipated.  I provided 25 minutes of non-face-to-face time during this encounter.  The patient was located at home.  The provider was located at [facility].   Susan LOISE Sayers, NP   Subjective:   Patient ID:  [name] is a 53 y.o. (DOB [birth year]) female.  Chief Complaint: No chief complaint on file.   HPI [name] presents for follow-up of MDD, GAD, insomnia and panic attacks.  Describes mood today as about the same. Pleasant. Flat. Tearful. Mood symptoms - reports anxiety and depression. Reports decreased irritability. Reports decreased interest and motivation. Reports panic attacks. Reports worry, rumination and over thinking. Reports ongoing health concerns - palliative care. Reports mood is variable. Stating I feel like I'm just kind of blah - no emotions. Taking medications as prescribed and feels they are helpful. Working with Marval Bunde for therapy.  Energy levels lower - I have spurts. Active, does not have a regular exercise routine with current physical disabilities. Is unable to enjoy some usual interests and activities. Married. Lives with husband and son. Has  a daughter - 38 in [place]. Spending time with family.  Appetite adequate. Weight gain 177 pounds. Reports sleep is variable. Averages 5 to 6 hours over a 24 hour period. Reports daytime napping. Reports difficulties with focus and concentration. Reports having issues with memory. Completing tasks. Managing some aspects of household. Disabled since 2016. Denies SI or HI.  Denies AH or VH. Denies self harm. Denies substance use.   Previous medication trials: Wellbutrin  - mean, Zoloft-not helpful  Review of Systems:  Review of Systems  Musculoskeletal:  Negative for gait problem.  Neurological:  Negative for tremors.  Psychiatric/Behavioral:         Please refer to HPI    Medications: I have reviewed the patient's current medications.  Current Outpatient Medications  Medication Sig Dispense Refill   albuterol  (PROVENTIL ) (2.5 MG/3ML) 0.083% nebulizer solution INHALE 3 ML BY NEBULIZATION EVERY 6 HOURS AS NEEDED FOR WHEEZING OR SHORTNESS OF BREATH 225 mL 1   albuterol  (VENTOLIN  HFA) 108 (90 Base) MCG/ACT inhaler Inhale 2 puffs into the lungs every 6 (six) hours as needed for wheezing or shortness of breath. 8 g 6   amoxicillin -clavulanate (AUGMENTIN ) 600-42.9 MG/5ML suspension Take 5 mLs (600 mg total) by mouth 2 (two) times daily for 4 days. 40 mL 0   aspirin  EC 81 MG tablet Take 1 tablet (81 mg total) by mouth daily. Swallow whole. (Patient taking differently: Take 81 mg by mouth daily. Chewable) 90 tablet 3   Bismuth Tribromoph-Petrolatum (XEROFORM OCCLUSIVE GAUZE STRIP) PADS Apply 1 each topically as directed. 50 each 1   bumetanide  (BUMEX ) 1 MG tablet Take 1 tablet (1  mg total) by mouth 2 (two) times daily. 180 tablet 3   buPROPion  (WELLBUTRIN  XL) 150 MG 24 hr tablet Take 1 tablet (150 mg total) by mouth daily. 30 tablet 2   clonazePAM  (KLONOPIN ) 1 MG tablet TAKE 1 TABLET BY MOUTH 3 TIMES A DAY AS NEEDED AND 1 EXTRA FOR SEVERE ANXIETY UP TO 10 A MONTH. 100 tablet 0   dexlansoprazole   (DEXILANT ) 60 MG capsule Take 1 capsule (60 mg total) by mouth daily. 90 capsule 5   diphenhydrAMINE  (BENADRYL ) 50 MG/ML injection Inject 25 mg into the vein 5 (five) times daily as needed (for nausea  (is 50 mg/ml)).     EPINEPHRINE  0.3 mg/0.3 mL IJ SOAJ injection INJECT 0.3 MG INTO THE MUSCLE AS NEEDED FOR ANAPHYLAXIS. 2 each 1   famotidine  (PEPCID ) 40 MG/5ML suspension Take 2.5 mLs (20 mg total) by mouth daily. (Patient taking differently: Take 20 mg by mouth daily as needed for heartburn or indigestion.) 75 mL 6   FLORASTOR 250 MG capsule Take 250 mg by mouth 2 (two) times daily.     FLUoxetine  (PROZAC ) 40 MG capsule Take 1 capsule (40 mg total) by mouth daily. 30 capsule 2   fluticasone  furoate-vilanterol (BREO ELLIPTA ) 100-25 MCG/ACT AEPB Inhale 2 puffs into the lungs daily.     fluticasone -salmeterol (ADVAIR HFA) 230-21 MCG/ACT inhaler Inhale 2 puffs into the lungs 2 (two) times daily. (Patient taking differently: Inhale 2 puffs into the lungs 2 (two) times daily as needed (for flares).) 1 each 11   guaiFENesin  (ROBITUSSIN) 100 MG/5ML liquid Take 10 mLs by mouth every 6 (six) hours as needed for cough or to loosen phlegm. 120 mL 0   Heparin  Na, Pork, Lock Flsh PF (BD HEPARIN  POSIFLUSH) 100 UNIT/ML SOLN USE 5 MLS IN PORT A CATH ONCE DAILY AFTER MEDICATION ADMINISTRATION AS A HEPLOCK AS DIRECTED. 150 mL 3   [Paused] hydrocortisone  (CORTEF ) 10 MG tablet Take 1-2 tablets (10-20 mg total) by mouth See admin instructions. Take 20 mg in the am and 10mg  in the evening. Take after completing prednisone  (Patient taking differently: Take 10-20 mg by mouth See admin instructions. Take 20 mg by mouth in the morning and 10 mg in the afternoon.)     hydrocortisone  (CORTEF ) 10 MG tablet Take 2.5 tablets (25 mg total) by mouth every evening for 2 days, THEN 2 tablets (20 mg total) every evening for 2 days, THEN 1.5 tablets (15 mg total) every evening for 2 days. 12 tablet 0   hydrocortisone  (CORTEF ) 20 MG tablet  Take 2.5 tablets (50 mg total) by mouth daily for 2 days, THEN 2 tablets (40 mg total) daily for 2 days, THEN 1.5 tablets (30 mg total) daily for 2 days. 12 tablet 0   hydrOXYzine  (ATARAX ) 10 MG/5ML syrup Take 12.5 mLs (25 mg total) by mouth 3 (three) times daily. 240 mL 0   hyoscyamine  (LEVSIN  SL) 0.125 MG SL tablet Place 1 tablet (0.125 mg total) under the tongue every 6 (six) hours as needed for cramping (esophageal spasm). Up to 1.25 mg daily 30 tablet 1   ipratropium-albuterol  (DUONEB) 0.5-2.5 (3) MG/3ML SOLN Take 3 mLs by nebulization every 6 (six) hours as needed (for shortness of breath or wheezing).     lamoTRIgine  (LAMICTAL ) 200 MG tablet TAKE 1 TABLET BY MOUTH EVERYDAY AT BEDTIME 30 tablet 2   lamoTRIgine  (LAMICTAL ) 25 MG tablet Take two tablets every morning. 60 tablet 2   lidocaine  (XYLOCAINE ) 2 % solution Use as directed 10 mLs  in the mouth or throat every 4 (four) hours as needed (for esophageal pain and pain with swallowing.). 100 mL 1   nitroGLYCERIN  (NITROSTAT ) 0.4 MG SL tablet DISSOLVE 1 TAB UNDER THE TONGUE EVERY 5 MINUTES AS NEEDED FOR CHEST PAIN. MAX OF 3 DOSES, THEN 911. 75 tablet 0   NURTEC 75 MG TBDP Take 75 mg by mouth daily as needed (migraines).     OLANZapine  (ZYPREXA ) 5 MG tablet Take 1 tablet (5 mg total) by mouth at bedtime. 30 tablet 2   OXYGEN  Inhale 5 L/min into the lungs continuous.     potassium chloride  (KLOR-CON ) 20 MEQ packet Take 20 mEq by mouth daily. Can be mixed in the juice for better test 30 packet 11   QVAR  REDIHALER 80 MCG/ACT inhaler Inhale 2 puffs into the lungs daily.     rosuvastatin  (CRESTOR ) 10 MG tablet Take 10 mg by mouth in the morning. Mid morning     sodium chloride  flush (BD POSIFLUSH) 0.9 % SOLN injection Use as directed for catheter maintenance 600 mL 3   Sodium Chloride  Flush (NORMAL SALINE FLUSH) 0.9 % SOLN Use 10 mLs as directed for catheter maintenance. 600 mL 3   sucralfate  (CARAFATE ) 1 GM/10ML suspension Take 1 g by mouth daily as  needed (as directed for ulcers).     SYNTHROID  100 MCG tablet Take 1 tablet (100 mcg total) by mouth every morning. (Patient taking differently: Take 100 mcg by mouth every morning.  Must be name brand*)     Tiotropium Bromide  Monohydrate (SPIRIVA  RESPIMAT) 1.25 MCG/ACT AERS Inhale 2 puffs into the lungs daily. 4 g 11   topiramate  (TOPAMAX ) 50 MG tablet Take 150 mg by mouth at bedtime.     zolpidem  (AMBIEN  CR) 12.5 MG CR tablet TAKE ONE TABLET BY MOUTH AT BEDTIME AS NEEDED FOR SLEEP. (Patient taking differently: Take 12.5 mg by mouth at bedtime.) 90 tablet 0   No current facility-administered medications for this visit.    Medication Side Effects: None  Allergies:  Allergies  Allergen Reactions   Ferrous Bisglycinate Chelate [Iron] Anaphylaxis and Other (See Comments)    Only IV - only FERRLICET   Mushroom Extract Complex (Obsolete) Anaphylaxis   Na Ferric Gluc Cplx In Sucrose Anaphylaxis   Cymbalta [Duloxetine Hcl] Swelling and Anxiety   Hydromorphone  Other (See Comments)    BP drop and heart rate drops 5.1.20 PT REPORTS THAT SHE TAKES DILAUDID  AT HOME   Morphine Other (See Comments)    I clawed out my IV(s)   Ondansetron  Hcl Itching and Other (See Comments)    Clawed off my skin   Promethazine Other (See Comments)    Made me to clinch up and my arms shook   Succinylcholine Other (See Comments)    Lock Jaw   Buprenorphine Hcl Hives   Compazine Other (See Comments)    Altered mental status Aggression   Duloxetine    Metoclopramide Other (See Comments)    Dystonia   Morphine And Codeine Hives   Ondansetron  Hives and Rash    Other reaction(s): Unknown Other reaction(s): Unknown   Promethazine Hcl Hives   Tegaderm Ag Mesh [Silver ] Rash and Other (See Comments)    Old formulation only, is able to tolerate new formulation    Past Medical History:  Diagnosis Date   Addison's disease (HCC)    Adrenal insufficiency (HCC)    Anemia    Anxiety    Aortic stenosis     Aortic stenosis  Appendicitis 12/19/2009   Appendicitis    Breast cancer (HCC)    STATUS POST BILATERAL MASTECTOMY. STATUS POST RECONSTRUCTION. SHE HAD SILICONE BREAST IMPLANTS AND THE LEFT IMPLANT IS LEAKING SLIGHTLY   Cellulitis of right middle finger 11/07/2018   Cervical cancer (HCC) 12/23/2018   Chest pain    CHF with right heart failure (HCC) 04/17/2017   Chronic respiratory failure with hypoxia (HCC) 12/23/2018   CKD (chronic kidney disease) stage 3, GFR 30-59 ml/min (HCC)    Cough variant asthma 04/13/2019   Depression    Functional neurological symptom disorder with mixed symptoms 2023   GERD (gastroesophageal reflux disease)    takes Dexilant  and carafate  and gi coctail    Headache    migraines on a daily and monthly regimen    Heart murmur    History of kidney stones    Hodgkin lymphoma (HCC)    STATUS POST MANTLE RADIATION   Hodgkin's lymphoma (HCC)    1987   Hypertension    Hypoxia    Multiple lung nodules    bilateral   Necrotizing fasciitis (HCC) 12/23/2018   Non-ischemic cardiomyopathy (HCC)    Osteoporosis    Ovarian tumor (benign)    bilateral   Palpitations    Pituitary adenoma (HCC) 12/23/2018   Pneumonia    PONV (postoperative nausea and vomiting)    Pre-diabetes    per pt; no meds   Pulmonary hypertension (HCC) 12/23/2018   Raynaud phenomenon    Right heart failure (HCC) 04/17/2017   Seizures (HCC)    last febrile seizure was approx 3 weeks ago per report on 12/01/2020   Supplemental oxygen  dependent    3 liters   SVT (supraventricular tachycardia) (HCC)    Tachycardia    Thyroid  cancer (HCC)    STATUS POST SURGICAL REMOVAL-CURRENT ON THYROID  REPLACEMENT    Family History  Adopted: Yes  Family history unknown: Yes    Social History   Socioeconomic History   Marital status: Married    Spouse name: Not on file   Number of children: Not on file   Years of education: Not on file   Highest education level: Not on file  Occupational  History   Not on file  Tobacco Use   Smoking status: Never   Smokeless tobacco: Never  Vaping Use   Vaping status: Never Used  Substance and Sexual Activity   Alcohol use: Not Currently    Comment: social    Drug use: No   Sexual activity: Not Currently    Birth control/protection: Surgical  Other Topics Concern   Not on file  Social History Narrative   Not on file   Social Drivers of Health   Financial Resource Strain: Medium Risk (08/21/2017)   Received from [facility]   Overall Financial Resource Strain (CARDIA)    Difficulty of Paying Living Expenses: Somewhat hard  Food Insecurity: No Food Insecurity (05/19/2024)   Hunger Vital Sign    Worried About Running Out of Food in the Last Year: Never true    Ran Out of Food in the Last Year: Never true  Transportation Needs: No Transportation Needs (05/19/2024)   PRAPARE - Administrator, Civil Service (Medical): No    Lack of Transportation (Non-Medical): No  Physical Activity: Unknown (08/21/2017)   Received from [facility]   Exercise Vital Sign    Days of Exercise per Week: 0 days    Minutes of Exercise per Session: Not  on file  Stress: Stress Concern Present (08/21/2017)   Received from [facility] - Occupational Stress Questionnaire    Feeling of Stress : Very much  Social Connections: Unknown (02/12/2022)   Received from [facility]   Social Network    Social Network: Not on file  Intimate Partner Violence: Not At Risk (05/19/2024)   Humiliation, Afraid, Rape, and Kick questionnaire    Fear of Current or Ex-Partner: No    Emotionally Abused: No    Physically Abused: No    Sexually Abused: No    Past Medical History, Surgical history, Social history, and Family history were reviewed and updated as appropriate.   Please see review of systems for further details on the patient's review from today.    Objective:   Physical Exam:  LMP  (LMP Unknown)   Physical Exam Constitutional:      General: She is not in acute distress. Musculoskeletal:        General: No deformity.  Neurological:     Mental Status: She is alert and oriented to person, place, and time.     Coordination: Coordination normal.  Psychiatric:        Attention and Perception: Attention and perception normal. She does not perceive auditory or visual hallucinations.        Mood and Affect: Mood normal. Mood is not anxious or depressed. Affect is not labile, blunt, angry or inappropriate.        Speech: Speech normal.        Behavior: Behavior normal.        Thought Content: Thought content normal. Thought content is not paranoid or delusional. Thought content does not include homicidal or suicidal ideation. Thought content does not include homicidal or suicidal plan.        Cognition and Memory: Cognition and memory normal.        Judgment: Judgment normal.     Comments: Insight intact     Lab Review:     Component Value Date/Time   NA 135 05/22/2024 0347   NA 140 08/20/2022 1513   K 3.8 05/22/2024 0347   CL 101 05/22/2024 0347   CO2 24 05/22/2024 0347   GLUCOSE 128 (H) 05/22/2024 0347   BUN 26 (H) 05/22/2024 0347   BUN 28 (H) 08/20/2022 1513   CREATININE 0.89 05/22/2024 0347   CREATININE 1.08 (H) 02/09/2022 1202   CALCIUM  8.7 (L) 05/22/2024 0347   PROT 6.9 05/18/2024 1405   PROT 7.1 01/23/2018 1225   ALBUMIN 3.1 (L) 05/20/2024 0500   ALBUMIN 4.4 01/23/2018 1225   AST 19 05/18/2024 1405   AST 26 02/09/2022 1202   ALT 19 05/18/2024 1405   ALT 40 02/09/2022 1202   ALKPHOS 50 05/18/2024 1405   BILITOT 0.6 05/18/2024 1405   BILITOT 0.4 02/09/2022 1202   GFRNONAA >60 05/22/2024 0347   GFRNONAA >60 02/09/2022 1202   GFRAA >60 04/27/2020 0613   GFRAA >60 02/10/2020 1020       Component Value Date/Time   WBC 10.1 05/22/2024 0347   RBC 4.06 05/22/2024 0347   HGB 9.6 (L) 05/22/2024 0347   HGB 10.6  (L) 08/20/2022 1513   HCT 32.9 (L) 05/22/2024 0347   HCT 34.4 08/20/2022 1513   PLT 415 (H) 05/22/2024 0347   PLT 414 08/20/2022 1513   MCV 81.0 05/22/2024 0347   MCV 78 (L) 08/20/2022 1513   MCH 23.6 (L) 05/22/2024 9652  MCHC 29.2 (L) 05/22/2024 0347   RDW 21.0 (H) 05/22/2024 0347   RDW 16.5 (H) 08/20/2022 1513   LYMPHSABS 0.6 (L) 05/20/2024 0241   MONOABS 0.6 05/20/2024 0241   EOSABS 0.0 05/20/2024 0241   BASOSABS 0.0 05/20/2024 0241    No results found for: POCLITH, LITHIUM   No results found for: PHENYTOIN, PHENOBARB, VALPROATE, CBMZ   .res Assessment: Plan:    Plan:  PDMP reviewed  Continue Clonazepam  1mg  - 3 times daily and 1 extra as needed for severe anxiety  Prozac  40mg  daily Wellbutrin  XL150mg  every morning - did not tolerate increase in dose - reports seizure history  Lamictal  200mg  daily Lamictal  50mg  every morning  Olanzapine  5mg  at hs   Seeing Marval Bunde therapy visits.  RTC 4 weeks  25 minutes spent dedicated to the care of this patient on the date of this encounter to include pre-visit review of records, ordering of medication, post visit documentation, and face-to-face time with the patient discussing MDD, GAD, insomnia, and panic attacks. Discussed continuing current medication regimen.  Discussed potential benefits, risk, and side effects of benzodiazepines to include potential risk of tolerance and dependence, as well as possible drowsiness.  Advised patient not to drive if experiencing drowsiness and to take lowest possible effective dose to minimize risk of dependence and tolerance.   Counseled patient regarding potential benefits, risks, and side effects of Lamictal  to include potential risk of Stevens-Johnson syndrome. Advised patient to stop taking Lamictal  and contact office immediately if rash develops and to seek urgent medical attention if rash is severe and/or spreading quickly.  Discussed potential metabolic side effects  associated with atypical antipsychotics, as well as potential risk for movement side effects. Advised pt to contact office if movement side effects occur.    Patient advised to contact office with any questions, adverse effects, or acute worsening in signs and symptoms.  Diagnoses and all orders for this visit:  Major depressive disorder, recurrent episode, moderate (HCC) -     buPROPion  (WELLBUTRIN  XL) 150 MG 24 hr tablet; Take 1 tablet (150 mg total) by mouth daily. -     FLUoxetine  (PROZAC ) 40 MG capsule; Take 1 capsule (40 mg total) by mouth daily. -     lamoTRIgine  (LAMICTAL ) 200 MG tablet; TAKE 1 TABLET BY MOUTH EVERYDAY AT BEDTIME -     OLANZapine  (ZYPREXA ) 5 MG tablet; Take 1 tablet (5 mg total) by mouth at bedtime. -     lamoTRIgine  (LAMICTAL ) 25 MG tablet; Take two tablets every morning.  Generalized anxiety disorder -     FLUoxetine  (PROZAC ) 40 MG capsule; Take 1 capsule (40 mg total) by mouth daily. -     OLANZapine  (ZYPREXA ) 5 MG tablet; Take 1 tablet (5 mg total) by mouth at bedtime.  Panic attacks -     clonazePAM  (KLONOPIN ) 1 MG tablet; TAKE 1 TABLET BY MOUTH 3 TIMES A DAY AS NEEDED AND 1 EXTRA FOR SEVERE ANXIETY UP TO 10 A MONTH.  Insomnia, unspecified type -     clonazePAM  (KLONOPIN ) 1 MG tablet; TAKE 1 TABLET BY MOUTH 3 TIMES A DAY AS NEEDED AND 1 EXTRA FOR SEVERE ANXIETY UP TO 10 A MONTH.     Please see After Visit Summary for patient specific instructions.  Future Appointments  Date Time Provider Department Center  05/28/2024  8:00 AM Bunde Sober, LCSW [facility] None    No orders of the defined types were placed in this encounter.     -------------------------------

## 2024-05-25 NOTE — Telephone Encounter (Signed)
 Received call from patient stating she was in hospital last week and got a blood culture which appeared to show the same candidiasis that appeared in her throat. She's been experiencing pneumonia, sepsis, bad coughs. Patient is requesting a follow up call to discuss symptoms and further scheduling. Please review and advise  Thank you

## 2024-05-26 NOTE — Telephone Encounter (Signed)
 Called and spoke with patient. Patient recently positive for Rhinovirus that turned into pneumonia. Patient was discharged on Friday with Augmentin . Patient states that she was coughing up blood so they did a sputum culture and it showed results below (available in epic). Patient is concerned about results and wonders what the next steps are. 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 Patient's appt has been rescheduled to Wednesday, 06/03/24 at 10 am. Patient has been advised that I will have you review records and if you have any recommendations prior to her appt we will contact her. Patient verbalized understanding and had no concerns.  Component Ref Range & Units (hover) 7 d ago  Specimen Description EXPECTORATED SPUTUM Performed at [facility], [address], [place], [place] [address]  Special Requests NONE Reflexed from [phone] Performed at [facility], [address], [place], [place] [address]  Gram Stain WBC PRESENT, PREDOMINANTLY MONONUCLEAR RARE GRAM POSITIVE RODS RARE BUDDING YEAST SEEN RARE GRAM POSITIVE COCCI IN PAIRS Performed at [facility], [address] [place], [place] [address]  Culture ABUNDANT CANDIDA ALBICANS  Report Status 05/22/2024 FINAL

## 2024-05-26 NOTE — Addendum Note (Signed)
 Addended by: Keirstyn Aydt N on: 05/26/2024 01:06 PM   Modules accepted: Orders

## 2024-05-27 ENCOUNTER — Other Ambulatory Visit (HOSPITAL_COMMUNITY): Payer: Self-pay

## 2024-05-27 MED ORDER — FLUCONAZOLE 200 MG PO TABS: ORAL_TABLET | ORAL | 0 refills | Status: AC

## 2024-05-27 NOTE — Telephone Encounter (Signed)
 Called and spoke with patient regarding recommendations. Patient would like RX sent to her [facility] pharmacy on file. Patient has been advised to follow up with Dr. Kassie as well. Patient verbalized understanding and had no concerns at the end of the call.

## 2024-05-27 NOTE — Addendum Note (Signed)
 Addended by: MERCER CRISTINO SAILOR on: 05/27/2024 08:36 AM   Modules accepted: Orders

## 2024-05-28 ENCOUNTER — Ambulatory Visit (INDEPENDENT_AMBULATORY_CARE_PROVIDER_SITE_OTHER): Admitting: Psychiatry

## 2024-05-28 ENCOUNTER — Telehealth: Payer: Self-pay

## 2024-05-28 DIAGNOSIS — F411 Generalized anxiety disorder: Secondary | ICD-10-CM

## 2024-05-28 NOTE — Telephone Encounter (Signed)
 Pt called to report she was having an issue getting her clonazepam  again. Per PMP she last filled 8/1 and wouldn't be due for a RF until 8/29. She said she only had 1 pill left. I told her she should have more left because tomorrow was day 28 and she gets RF for 30 days. She reported she had been in the hospital for 4 days with PNA. I told her she should have more then because for 4 days she didn't need to use her medication. She reports having to take more d/t the hospitalization.   I did not tell her I would ask about doing an early RF since she has gotten early multiple times previously.   Nothing needed at this time, but wanted to document phone call.

## 2024-05-28 NOTE — Progress Notes (Addendum)
 [facility] Counselor/Therapist Progress Note  Patient ID: [name], MRN: [id number],    Date: 05/28/2024  Time Spent: 48 minutes     Treatment Type: Individual Therapy  Virtual Visit via Telehealth Note: MyChartVideo session  Connected with patient by a telemedicine/telehealth application, with their informed consent, and verified patient privacy and that I am speaking with the correct person using two identifiers. I discussed the limitations, risks, security and privacy concerns of performing psychotherapy and the availability of in person appointments. I also discussed with the patient that there may be a patient responsible charge related to this service. The patient expressed understanding and agreed to proceed. I discussed the treatment planning with the patient. The patient was provided an opportunity to ask questions and all were answered. The patient agreed with the plan and demonstrated an understanding of the instructions. The patient was advised to call  our office if  symptoms worsen or feel they are in a crisis state and need immediate contact.   Therapist Location: office Patient Location: home   Reported Symptoms: anxiety, depression, some anger, irrational fear mostly related to son and daughter    Mental Status Exam:  Appearance:   Casual     Behavior:  Appropriate, Sharing, and Motivated  Motor:  Impacted by her illness  Speech/Language:   Clear and Coherent  Affect:  Anxious, depressed  Mood:  anxious and depressed  Thought process:  goal directed  Thought content:    Rumination  Sensory/Perceptual disturbances:    WNL  Orientation:  oriented to person, place, situation, day of week, month of year, year, and getting mixed up on some time and dates  Attention:  Fair  Concentration:  Fair and Poor  Memory:  Some memory issues re: her illness  Fund of knowledge:   Fair  Insight:    Good and Fair  Judgment:   Good and Fair  Impulse Control:  Good  and Fair   Risk Assessment: Danger to Self:  No Self-injurious Behavior: No Danger to Others: No Duty to Warn:no Physical Aggression / Violence:No  Access to Firearms a concern: No  Gang Involvement:No   Subjective:   Patient participating actively in video session today working further on her anxiety, depression, frustrations, and some irrational fear related to her son and daughter which she began working on some the previous session.  Actively involved today, better insight on some issues, tearful a lot during session.  Reportedly having less N/V and hate.  Enjoyed some recent outings she was able to take with family.  Tearfulness off and on during session but did stay very engaged in our time together.  Trying not to dwell on the future and stay more in the present as we worked more on last session.  Continues to fluctuate some between depression and anxiety and is able to manage these better at times, or with the help with supportive contacts.  States that she continues to not be involved with hospice nor palliative care and seems to have some residual ill feelings.  Does vent her frustrations and anger overall, and states that she works on trying to let it go.  Denies any thoughts to harm herself or others and shares several activities that she has been able to be involved in including getting outside the home some with family.  Interventions: Cognitive Behavioral Therapy, Solution-Oriented/Positive Psychology, and Ego-Supportive Long-term goal:  Patient will confront her anxiety, fears, anger reported re: her illness and use time  in therapy and beyond to work on this, while receiving feedback and support from therapist. Help her determine what she feels is realistic for her considering her health challenges and difficult prognosis.  Short-term goal: Participate in therapy and initiate communication of needs and desires at this point in her struggle physically.  Strategies: Share and  talk through anxiety, anger, and depression that are experienced and work to find and use helpful coping tools to better manage those feelings.   Diagnosis:   ICD-10-CM   1. Generalized anxiety disorder  F41.1      Plan: Patient online for session today working through more of her personal and family feelings some associated with her health issues and some not.  Also continue to make assumptions at times which I gently challenged with patient, especially since our assumptions are not always accurate as we discussed and worked on last session.  Worked with her anxiousness, some depressive thoughts and feelings, some anger, and frustration.  Reports that she does not seem to feel these emotions as strongly today as last session.  Patient is a cancer patient but not under the care of hospice nor palliative care, she reports, per my choice after having some many problems with them.  Patient venting freely in session today and did seem to appreciate the contact and the opportunity to vent and be heard.  Reporting ups and downs emotionally but also some quality time with family particularly her teenage children.  (Not all details included in this note due to patient privacy needs.)  Goal review and progress/challenges noted with patient.  Next appointment within 3 weeks.   Barnie Bunde, LCSW

## 2024-05-28 NOTE — Addendum Note (Signed)
 Addended by: Jenasis Straley on: 05/28/2024 06:03 PM   Modules accepted: Level of Service

## 2024-05-29 ENCOUNTER — Ambulatory Visit
Admission: EM | Admit: 2024-05-29 | Discharge: 2024-05-29 | Disposition: A | Discharge status: Home / Self Care | Attending: Nurse Practitioner | Admitting: Nurse Practitioner

## 2024-05-29 ENCOUNTER — Encounter: Payer: Self-pay | Admitting: Hematology and Oncology

## 2024-05-29 ENCOUNTER — Other Ambulatory Visit (HOSPITAL_COMMUNITY): Payer: Self-pay

## 2024-05-29 DIAGNOSIS — M546 Pain in thoracic spine: Secondary | ICD-10-CM

## 2024-05-29 DIAGNOSIS — Z8739 Personal history of other diseases of the musculoskeletal system and connective tissue: Secondary | ICD-10-CM

## 2024-05-29 DIAGNOSIS — D509 Iron deficiency anemia, unspecified: Secondary | ICD-10-CM | POA: Diagnosis not present

## 2024-05-29 DIAGNOSIS — Z8639 Personal history of other endocrine, nutritional and metabolic disease: Secondary | ICD-10-CM | POA: Diagnosis not present

## 2024-05-29 DIAGNOSIS — Z8701 Personal history of pneumonia (recurrent): Secondary | ICD-10-CM | POA: Diagnosis not present

## 2024-05-29 DIAGNOSIS — E271 Primary adrenocortical insufficiency: Secondary | ICD-10-CM | POA: Diagnosis not present

## 2024-05-29 MED ORDER — CYCLOBENZAPRINE HCL 10 MG PO TABS: 10.0000 mg | ORAL_TABLET | Freq: Two times a day (BID) | ORAL | 0 refills | Status: DC | PRN

## 2024-05-29 MED ORDER — NORMAL SALINE FLUSH 0.9 % IV SOLN
INTRAVENOUS | 3 refills | Status: DC
  Filled 2024-05-29: qty 300, 30d supply, fill #0
  Filled 2024-06-08 – 2024-06-09 (×2): qty 600, 30d supply, fill #0
  Filled 2024-06-19: qty 600, 30d supply, fill #1

## 2024-05-29 MED ORDER — NORMAL SALINE FLUSH 0.9 % IV SOLN
INTRAVENOUS | 2 refills | Status: DC
  Filled 2024-05-29: qty 600, 30d supply, fill #0

## 2024-05-29 MED ORDER — LIDOCAINE 5 % EX PTCH: 1.0000 | MEDICATED_PATCH | CUTANEOUS | 0 refills | Status: DC

## 2024-05-29 NOTE — ED Provider Notes (Signed)
 [facility]    CSN: [id number] Arrival date & time: 05/29/24  1727      History   Chief Complaint Chief Complaint  Patient presents with   Back Pain    HPI [name] is a 53 y.o. female.   The history is provided by the patient and the spouse.   Patient presents for complaints of left-sided mid and low back pain that started 1 day ago.  Patient states that she recently was discharged from the hospital for pneumonia.  States that she continues to have episodes of coughing spells.  States that she was at her son's game yesterday and turned slightly, and developed pain in her left side.  She states that she also wonders if the coughing spells has had something to do with her pain.  She denies numbness, tingling, loss of bowel or bladder function, lower extremity weakness, or upper extremity pain.  Patient and spouse state that they did go to a local pharmacy and purchased a back brace.  Patient reports that she is currently on high-dose steroids.  Patient states she has also noticed a bruise on her left side.  Patient rates pain 9/10 at present.  Past Medical History:  Diagnosis Date   Addison's disease (HCC)    Adrenal insufficiency (HCC)    Anemia    Anxiety    Aortic stenosis    Aortic stenosis    Appendicitis 12/19/2009   Appendicitis    Breast cancer (HCC)    STATUS POST BILATERAL MASTECTOMY. STATUS POST RECONSTRUCTION. SHE HAD SILICONE BREAST IMPLANTS AND THE LEFT IMPLANT IS LEAKING SLIGHTLY   Cellulitis of right middle finger 11/07/2018   Cervical cancer (HCC) 12/23/2018   Chest pain    CHF with right heart failure (HCC) 04/17/2017   Chronic respiratory failure with hypoxia (HCC) 12/23/2018   CKD (chronic kidney disease) stage 3, GFR 30-59 ml/min (HCC)    Cough variant asthma 04/13/2019   Depression    Functional neurological symptom disorder with mixed symptoms 2023   GERD (gastroesophageal reflux disease)    takes Dexilant  and carafate  and gi  coctail    Headache    migraines on a daily and monthly regimen    Heart murmur    History of kidney stones    Hodgkin lymphoma (HCC)    STATUS POST MANTLE RADIATION   Hodgkin's lymphoma (HCC)    1987   Hypertension    Hypoxia    Multiple lung nodules    bilateral   Necrotizing fasciitis (HCC) 12/23/2018   Non-ischemic cardiomyopathy (HCC)    Osteoporosis    Ovarian tumor (benign)    bilateral   Palpitations    Pituitary adenoma (HCC) 12/23/2018   Pneumonia    PONV (postoperative nausea and vomiting)    Pre-diabetes    per pt; no meds   Pulmonary hypertension (HCC) 12/23/2018   Raynaud phenomenon    Right heart failure (HCC) 04/17/2017   Seizures (HCC)    last febrile seizure was approx 3 weeks ago per report on 12/01/2020   Supplemental oxygen  dependent    3 liters   SVT (supraventricular tachycardia) (HCC)    Tachycardia    Thyroid  cancer (HCC)    STATUS POST SURGICAL REMOVAL-CURRENT ON THYROID  REPLACEMENT    Patient Active Problem List   Diagnosis Date Noted   Hypophosphatemia 05/19/2024   Acute on chronic respiratory failure with hypoxia (HCC) 05/18/2024   Pulmonary arterial hypertension (HCC) 05/18/2024   Pneumonia 05/18/2024  Hiatal hernia 04/13/2024   Hematemesis with nausea 04/13/2024   Nausea and vomiting 04/13/2024   Fundic gland polyps of stomach, benign 04/13/2024   Benign esophageal stricture 02/11/2024   Gastroesophageal reflux disease with esophagitis 01/07/2024   Esophageal candidiasis (HCC) 01/07/2024   Esophageal dysphagia 12/26/2023   Odynophagia 12/26/2023   Gastric polyps 12/26/2023   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   AKI (acute kidney injury) (HCC) 04/23/2023   Adrenal crisis (HCC) 04/22/2023   Severe persistent asthma 03/09/2023   CKD stage 3a, GFR 45-59 ml/min (HCC) 03/09/2023   Goals of care, counseling/discussion 01/22/2023   Anemia 01/22/2023   Cellulitis 09/13/2022   Grade I diastolic dysfunction 09/13/2022   Excessive daytime sleepiness  08/01/2022   Anxiety    Anxiety and depression 04/06/2022   GERD without esophagitis 04/06/2022   CAP (community acquired pneumonia) 04/06/2022   PICC (peripherally inserted central catheter) in place    Bacteremia due to Enterobacter species 03/29/2022   Normocytic anemia 03/29/2022   Major depressive disorder, recurrent episode, moderate (HCC)    Generalized anxiety disorder/depression    Seizure-like activity (HCC) 12/08/2020   High anion gap metabolic acidosis 12/08/2020   Hypokalemia 12/08/2020   Acute renal failure superimposed on stage 3a chronic kidney disease (HCC) 04/27/2020   Vasculopathy 04/27/2020   Bacteremia 04/19/2020   Addison's disease (HCC)    Chronic nausea 04/01/2020   Generalized weakness 02/10/2020   Headache 02/10/2020   Subcutaneous nodule 02/01/2020   Port-A-Cath in place 09/09/2019   Superficial thrombophlebitis 07/16/2019   Injury of left index finger 12/24/2018   History of pituitary adenoma 12/23/2018   History of cervical cancer 12/23/2018   SOB (shortness of breath)    Nephrolithiasis 11/07/2018   Migraine 11/07/2018   PVC's (premature ventricular contractions) 10/27/2018   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   Hyperlipidemia 03/09/2014   Hx of Hodgkins lymphoma    History of ductal carcinoma in situ (DCIS) of breast    History of thyroid  cancer s/p thyroidectomy and post surgical hypothyroidism    Raynaud phenomenon     Past Surgical History:  Procedure Laterality Date   ABDOMINAL HYSTERECTOMY     AMPUTATION Left 01/30/2019   Procedure: Left Index finger amputation with flap reconstruction and repair reconstruction;  Surgeon: Camella Fallow, MD;  Location: [facility];  Service: Orthopedics;  Laterality: Left;   APPENDECTOMY     BIOPSY OF SKIN SUBCUTANEOUS TISSUE AND/OR MUCOUS MEMBRANE  12/26/2023   Procedure: BIOPSY, SKIN, SUBCUTANEOUS TISSUE, OR MUCOUS MEMBRANE;  Surgeon: Leigh Elspeth SQUIBB, MD;  Location: [facility];  Service:  Gastroenterology;;   breast implants and removal      breast implants but leaking      CARDIAC CATHETERIZATION  05/18/2009   NORMAL CATH   CESAREAN SECTION  2007   and 1997   COLONOSCOPY     ESOPHAGEAL DILATION  02/11/2024   Procedure: DILATION, ESOPHAGUS;  Surgeon: San Sandor GAILS, DO;  Location: [facility];  Service: Gastroenterology;;   ESOPHAGOGASTRODUODENOSCOPY N/A 12/26/2023   Procedure: EGD (ESOPHAGOGASTRODUODENOSCOPY);  Surgeon: Leigh Elspeth SQUIBB, MD;  Location: [facility];  Service: Gastroenterology;  Laterality: N/A;   ESOPHAGOGASTRODUODENOSCOPY N/A 02/11/2024   Procedure: EGD (ESOPHAGOGASTRODUODENOSCOPY);  Surgeon: San Sandor GAILS, DO;  Location: [facility];  Service: Gastroenterology;  Laterality: N/A;   ESOPHAGOGASTRODUODENOSCOPY N/A 04/13/2024   Procedure: EGD (ESOPHAGOGASTRODUODENOSCOPY);  Surgeon: San Sandor GAILS, DO;  Location: [facility];  Service: Gastroenterology;  Laterality: N/A;   HOT HEMOSTASIS N/A 12/26/2023   Procedure: EGD, WITH ARGON PLASMA COAGULATION;  Surgeon: Leigh Elspeth SQUIBB, MD;  Location: [facility];  Service: Gastroenterology;  Laterality: N/A;   hx of chemotherapy      hx of radiation therapy      I & D EXTREMITY Left 12/23/2018   Procedure: IRRIGATION AND DEBRIDEMENT HAND / INDEX FINGER;  Surgeon: Camella Fallow, MD;  Location: [facility];  Service: Orthopedics;  Laterality: Left;   IR CV LINE INJECTION  03/22/2022   IR FLUORO GUIDE CV LINE RIGHT  10/09/2022   IR FLUORO GUIDE CV LINE RIGHT  01/18/2023   IR FLUORO GUIDE CV LINE RIGHT  03/26/2023   IR FLUORO GUIDE CV LINE RIGHT  10/10/2023   IR FLUORO GUIDE CV LINE RIGHT  01/06/2024   IR IMAGING GUIDED PORT INSERTION  05/06/2020   IR IMAGING GUIDED PORT INSERTION  12/04/2021   IR IMAGING GUIDED PORT INSERTION  02/25/2024   IR PATIENT EVAL TECH 0-60 MINS  03/12/2024   IR RADIOLOGIST EVAL & MGMT  03/12/2024   IR RADIOLOGIST EVAL & MGMT  03/31/2024   IR REMOVAL TUN ACCESS W/ PORT W/O FL MOD  SED  04/27/2020   IR REMOVAL TUN ACCESS W/ PORT W/O FL MOD SED  12/04/2021   IR REMOVAL TUN ACCESS W/ PORT W/O FL MOD SED  03/30/2022   IR REMOVAL TUN ACCESS W/ PORT W/O FL MOD SED  02/27/2024   IR REMOVAL TUN CV CATH W/O FL  02/25/2024   IR US  GUIDE VASC ACCESS RIGHT  10/09/2022   KIDNEY STONE SURGERY     LUMBAR PUNCTURE W/ INTRATHECAL CHEMOTHERAPY     MASTECTOMY     PITUITARY SURGERY     RIGHT/LEFT HEART CATH AND CORONARY ANGIOGRAPHY N/A 04/02/2018   Procedure: RIGHT/LEFT HEART CATH AND CORONARY ANGIOGRAPHY;  Surgeon: Verlin Lonni BIRCH, MD;  Location: [facility];  Service: Cardiovascular;  Laterality: N/A;   RIGHT/LEFT HEART CATH AND CORONARY ANGIOGRAPHY N/A 08/31/2022   Procedure: RIGHT/LEFT HEART CATH AND CORONARY ANGIOGRAPHY;  Surgeon: Cherrie Toribio SAUNDERS, MD;  Location: [facility];  Service: Cardiovascular;  Laterality: N/A;   TOOTH EXTRACTION N/A 12/05/2020   Procedure: DENTAL RESTORATION/EXTRACTIONS;  Surgeon: Arvil Lonni RAMAN, MD;  Location: [facility];  Service: Oral Surgery;  Laterality: N/A;   TOTAL THYROIDECTOMY     VIDEO BRONCHOSCOPY Bilateral 11/14/2018   Procedure: VIDEO BRONCHOSCOPY WITHOUT FLUORO;  Surgeon: Kassie Acquanetta Bradley, MD;  Location: [facility];  Service: Cardiopulmonary;  Laterality: Bilateral;   VIDEO BRONCHOSCOPY WITH ENDOBRONCHIAL ULTRASOUND N/A 11/19/2018   Procedure: VIDEO BRONCHOSCOPY WITH ENDOBRONCHIAL ULTRASOUND;  Surgeon: Kassie Acquanetta Bradley, MD;  Location: [facility] OR;  Service: Thoracic;  Laterality: N/A;    OB History   No obstetric history on file.      Home Medications    Prior to Admission medications   Medication Sig Start Date End Date Taking? Authorizing Provider  albuterol  (PROVENTIL ) (2.5 MG/3ML) 0.083% nebulizer solution INHALE 3 ML BY NEBULIZATION EVERY 6 HOURS AS NEEDED FOR WHEEZING OR SHORTNESS OF BREATH 12/13/23  Yes Kassie Acquanetta Bradley, MD  albuterol  (VENTOLIN  HFA) 108 [phone] Base) MCG/ACT inhaler Inhale 2 puffs into the  lungs every 6 (six) hours as needed for wheezing or shortness of breath. 01/08/24  Yes Kassie Acquanetta Bradley, MD  aspirin  EC 81 MG tablet Take 1 tablet (81 mg total) by mouth daily. Swallow whole. Patient taking differently: Take 81 mg by mouth daily. Chewable 07/16/22  Yes Nahser, Aleene PARAS, MD  Bismuth Tribromoph-Petrolatum (XEROFORM OCCLUSIVE GAUZE STRIP) PADS Apply 1  each topically as directed. 09/17/22  Yes Tobie Yetta HERO, MD  bumetanide  (BUMEX ) 1 MG tablet Take 1 tablet (1 mg total) by mouth 2 (two) times daily. 03/11/24  Yes Nahser, Aleene PARAS, MD  buPROPion  (WELLBUTRIN  XL) 150 MG 24 hr tablet Take 1 tablet (150 mg total) by mouth daily. 05/25/24  Yes Mozingo, Regina Nattalie, NP  clonazePAM  (KLONOPIN ) 1 MG tablet TAKE 1 TABLET BY MOUTH 3 TIMES A DAY AS NEEDED AND 1 EXTRA FOR SEVERE ANXIETY UP TO 10 A MONTH. 05/25/24  Yes Mozingo, Regina Nattalie, NP  dexlansoprazole  (DEXILANT ) 60 MG capsule Take 1 capsule (60 mg total) by mouth daily. 04/13/24  Yes Cirigliano, Vito V, DO  diphenhydrAMINE  (BENADRYL ) 50 MG/ML injection Inject 25 mg into the vein 5 (five) times daily as needed (for nausea  (is 50 mg/ml)).   Yes [provider]  EPINEPHRINE  0.3 mg/0.3 mL IJ SOAJ injection INJECT 0.3 MG INTO THE MUSCLE AS NEEDED FOR ANAPHYLAXIS. 04/25/23  Yes Kassie Acquanetta Bradley, MD  FLORASTOR 250 MG capsule Take 250 mg by mouth 2 (two) times daily.   Yes [provider]  fluconazole  (DIFLUCAN ) 200 MG tablet Take 2 tablets (400 mg total) by mouth daily for 1 day, THEN 1 tablet (200 mg total) daily for 20 days. 05/27/24 06/17/24 Yes Cirigliano, Vito V, DO  FLUoxetine  (PROZAC ) 40 MG capsule Take 1 capsule (40 mg total) by mouth daily. 05/25/24  Yes Mozingo, Regina Nattalie, NP  fluticasone  furoate-vilanterol (BREO ELLIPTA ) 100-25 MCG/ACT AEPB Inhale 2 puffs into the lungs daily.   Yes [provider]  fluticasone -salmeterol (ADVAIR HFA) 230-21 MCG/ACT inhaler Inhale 2 puffs into the lungs 2 (two) times  daily. Patient taking differently: Inhale 2 puffs into the lungs 2 (two) times daily as needed (for flares). 01/08/24  Yes Kassie Acquanetta Bradley, MD  guaiFENesin  (ROBITUSSIN) 100 MG/5ML liquid Take 10 mLs by mouth every 6 (six) hours as needed for cough or to loosen phlegm. 05/22/24  Yes Von Bellis, MD  hydrocortisone  (CORTEF ) 10 MG tablet Take 1-2 tablets (10-20 mg total) by mouth See admin instructions. Take 20 mg in the am and 10mg  in the evening. Take after completing prednisone  Patient taking differently: Take 10-20 mg by mouth See admin instructions. Take 20 mg by mouth in the morning and 10 mg in the afternoon. 09/29/22  Yes Tobie Yetta HERO, MD  hydrOXYzine  (ATARAX ) 10 MG/5ML syrup Take 12.5 mLs (25 mg total) by mouth 3 (three) times daily. 05/22/24  Yes Von Bellis, MD  hyoscyamine  (LEVSIN  SL) 0.125 MG SL tablet Place 1 tablet (0.125 mg total) under the tongue every 6 (six) hours as needed for cramping (esophageal spasm). Up to 1.25 mg daily 05/22/24  Yes Von Bellis, MD  ipratropium-albuterol  (DUONEB) 0.5-2.5 (3) MG/3ML SOLN Take 3 mLs by nebulization every 6 (six) hours as needed (for shortness of breath or wheezing).   Yes [provider]  lamoTRIgine  (LAMICTAL ) 200 MG tablet TAKE 1 TABLET BY MOUTH EVERYDAY AT BEDTIME 05/25/24  Yes Mozingo, Regina Nattalie, NP  lamoTRIgine  (LAMICTAL ) 25 MG tablet Take two tablets every morning. 05/25/24  Yes Mozingo, Regina Nattalie, NP  lidocaine  (XYLOCAINE ) 2 % solution Use as directed 10 mLs in the mouth or throat every 4 (four) hours as needed (for esophageal pain and pain with swallowing.). 12/22/23  Yes Harris, Abigail, PA-C  nitroGLYCERIN  (NITROSTAT ) 0.4 MG SL tablet DISSOLVE 1 TAB UNDER THE TONGUE EVERY 5 MINUTES AS NEEDED FOR CHEST PAIN. MAX OF 3 DOSES, THEN 911. 07/03/23  Yes Nahser, Aleene PARAS, MD  NURTEC 75 MG TBDP Take 75 mg by mouth daily as needed (migraines). 09/03/23  Yes [provider]  OLANZapine  (ZYPREXA ) 5 MG tablet Take 1  tablet (5 mg total) by mouth at bedtime. 05/25/24  Yes Mozingo, Regina Nattalie, NP  OXYGEN  Inhale 5 L/min into the lungs continuous.   Yes [provider]  QVAR  REDIHALER 80 MCG/ACT inhaler Inhale 2 puffs into the lungs daily. 05/10/23  Yes [provider]  rosuvastatin  (CRESTOR ) 10 MG tablet Take 10 mg by mouth in the morning. Mid morning 02/28/18  Yes [provider]  sodium chloride  flush (BD POSIFLUSH) 0.9 % SOLN injection Use as directed for catheter maintenance 02/07/24  Yes   Sodium Chloride  Flush (NORMAL SALINE FLUSH) 0.9 % SOLN Use as directed for catheter maintenance. 02/07/24  Yes   sucralfate  (CARAFATE ) 1 GM/10ML suspension Take 1 g by mouth daily as needed (as directed for ulcers).   Yes [provider]  SYNTHROID  100 MCG tablet Take 1 tablet (100 mcg total) by mouth every morning. Patient taking differently: Take 100 mcg by mouth every morning.  Must be name brand* 10/19/21  Yes Gudena, Vinay, MD  Tiotropium Bromide  Monohydrate (SPIRIVA  RESPIMAT) 1.25 MCG/ACT AERS Inhale 2 puffs into the lungs daily. 01/08/24  Yes Kassie Acquanetta Bradley, MD  topiramate  (TOPAMAX ) 50 MG tablet Take 150 mg by mouth at bedtime. 12/25/19  Yes [provider]  zolpidem  (AMBIEN  CR) 12.5 MG CR tablet TAKE ONE TABLET BY MOUTH AT BEDTIME AS NEEDED FOR SLEEP. Patient taking differently: Take 12.5 mg by mouth at bedtime. 05/14/23  Yes   famotidine  (PEPCID ) 40 MG/5ML suspension Take 2.5 mLs (20 mg total) by mouth daily. Patient taking differently: Take 20 mg by mouth daily as needed for heartburn or indigestion. 04/13/24 05/19/24  Cirigliano, Vito V, DO  Heparin  Na, Pork, Lock Flsh PF (BD HEPARIN  POSIFLUSH) 100 UNIT/ML SOLN USE 5 MLS IN PORT A CATH ONCE DAILY AFTER MEDICATION ADMINISTRATION AS A HEPLOCK AS DIRECTED. 04/01/24     potassium chloride  (KLOR-CON ) 20 MEQ packet Take 20 mEq by mouth daily. Can be mixed in the juice for better test 05/22/24 05/22/25  Von Bellis, MD  Sodium Chloride   Flush (NORMAL SALINE FLUSH) 0.9 % SOLN Use 10mls as directed for catheter maintenance 05/29/24       Family History Family History  Adopted: Yes  Family history unknown: Yes    Social History Social History   Tobacco Use   Smoking status: Never   Smokeless tobacco: Never  Vaping Use   Vaping status: Never Used  Substance Use Topics   Alcohol use: Not Currently    Comment: social    Drug use: No     Allergies   Ferrous bisglycinate chelate [iron], Mushroom extract complex (obsolete), Na ferric gluc cplx in sucrose, Cymbalta [duloxetine hcl], Hydromorphone , Morphine, Ondansetron  hcl, Promethazine, Succinylcholine, Buprenorphine hcl, Compazine, Duloxetine, Metoclopramide, Morphine and codeine, Ondansetron , Promethazine hcl, and Tegaderm ag mesh [silver ]   Review of Systems Review of Systems Per HPI  Physical Exam Triage Vital Signs ED Triage Vitals  Encounter Vitals Group     BP 05/29/24 1743 115/77     Girls Systolic BP Percentile --      Girls Diastolic BP Percentile --      Boys Systolic BP Percentile --      Boys Diastolic BP Percentile --      Pulse Rate 05/29/24 1743 95     Resp 05/29/24 1743  18     Temp 05/29/24 1743 98.6 F (37 C)     Temp Source 05/29/24 1743 Oral     SpO2 05/29/24 1744 95 %     Weight --      Height --      Head Circumference --      Peak Flow --      Pain Score 05/29/24 1749 9     Pain Loc --      Pain Education --      Exclude from Growth Chart --    No data found.  Updated Vital Signs BP 115/77 (BP Location: Left Arm)   Pulse 95   Temp 98.6 F (37 C) (Oral)   Resp 18   LMP  (LMP Unknown)   SpO2 95%   Visual Acuity Right Eye Distance:   Left Eye Distance:   Bilateral Distance:    Right Eye Near:   Left Eye Near:    Bilateral Near:     Physical Exam Vitals and nursing note reviewed.  Constitutional:      Appearance: Normal appearance. She is ill-appearing.  HENT:     Head: Normocephalic.  Eyes:     Pupils:  Pupils are equal, round, and reactive to light.  Cardiovascular:     Rate and Rhythm: Normal rate and regular rhythm.     Pulses: Normal pulses.     Heart sounds: Normal heart sounds.  Pulmonary:     Effort: Pulmonary effort is normal. No respiratory distress.     Breath sounds: Normal breath sounds. No stridor. No wheezing, rhonchi or rales.     Comments: Patient on continuous O2 at baseline Musculoskeletal:       Arms:     Cervical back: Normal range of motion.  Skin:    General: Skin is warm and dry.  Neurological:     General: No focal deficit present.     Mental Status: She is alert and oriented to person, place, and time.      UC Treatments / Results  Labs (all labs ordered are listed, but only abnormal results are displayed) Labs Reviewed - No data to display  EKG   Radiology No results found.  Procedures Procedures (including critical care time)  Medications Ordered in UC Medications - No data to display  Initial Impression / Assessment and Plan / UC Course  I have reviewed the triage vital signs and the nursing notes.  Pertinent labs & imaging results that were available during my care of the patient were reviewed by me and considered in my medical decision making (see chart for details).  Patient with significant past medical history presents for complaints of left-sided mid and low back pain after bending and turning 1 day ago.  Symptoms appear to be consistent with a lumbar strain. Will treat cyclobenzaprine  10 mg for spasm and stiffness and lidocaine  5% patches for the patient to apply topically.  Patient spouse advised that patient can increase her daily steroid, recommend doing the same in this event.. Supportive care recommendations were provided and discussed with the patient and her spouse to include Tylenol  for pain or discomfort, the use of ice or heat, and staying as active as possible. Patient was advised to follow-up with orthopedics if symptoms fail  to improve with this treatment. Patient was also given strict ER follow-up precautions. Patient is in agreement with this plan of care and verbalizes understanding. All questions were answered. Patient stable for discharge.  Final Clinical Impressions(s) / UC Diagnoses   Final diagnoses:  None   Discharge Instructions   None    ED Prescriptions   None    PDMP not reviewed this encounter.   Gilmer Etta PARAS, NP 05/29/24 1826

## 2024-05-29 NOTE — ED Triage Notes (Signed)
 Patient reports she bent over last night at a football game and mess her back up. Patient reports she has a bruise on the right side of her back. Patient reports lower back pain. Pain 09/10.

## 2024-05-29 NOTE — Discharge Instructions (Addendum)
 Take medication as prescribed. You may take over-the-counter Tylenol  as needed for pain or discomfort. Apply ice or heat as needed.  Apply ice for pain or swelling, heat for spasm or stiffness.  Apply for 20 minutes, remove for 1 hour, repeat as needed. As discussed, recommend increasing your steroid as discussed and recommended by your primary doctors. If symptoms fail to improve over the next several days, or appear to worsen, recommend follow-up with your primary care physician for further evaluation. Go to the emergency department immediately if you experience worsening back pain, worsening shortness of breath, difficulty breathing, loss of bowel or bladder function, numbness or tingling in your lower extremities or upper extremities, or other concerns. Follow-up as needed.

## 2024-06-02 ENCOUNTER — Telehealth: Payer: Self-pay

## 2024-06-02 NOTE — Telephone Encounter (Signed)
 Pt pays out of pocket for #100 Clonazepam , last refill 05/31/24, 05/01/24.  Received a PA request due to quantity limit.

## 2024-06-03 ENCOUNTER — Ambulatory Visit (INDEPENDENT_AMBULATORY_CARE_PROVIDER_SITE_OTHER): Admitting: Gastroenterology

## 2024-06-03 ENCOUNTER — Encounter: Payer: Self-pay | Admitting: Hematology and Oncology

## 2024-06-03 VITALS — BP 110/70 | HR 104 | Ht 64.0 in | Wt 171.0 lb

## 2024-06-03 DIAGNOSIS — R1310 Dysphagia, unspecified: Secondary | ICD-10-CM

## 2024-06-03 DIAGNOSIS — R911 Solitary pulmonary nodule: Secondary | ICD-10-CM | POA: Diagnosis not present

## 2024-06-03 DIAGNOSIS — R112 Nausea with vomiting, unspecified: Secondary | ICD-10-CM | POA: Diagnosis not present

## 2024-06-03 DIAGNOSIS — B3781 Candidal esophagitis: Secondary | ICD-10-CM | POA: Diagnosis not present

## 2024-06-03 DIAGNOSIS — Z8619 Personal history of other infectious and parasitic diseases: Secondary | ICD-10-CM

## 2024-06-03 DIAGNOSIS — N1831 Chronic kidney disease, stage 3a: Secondary | ICD-10-CM

## 2024-06-03 DIAGNOSIS — E271 Primary adrenocortical insufficiency: Secondary | ICD-10-CM | POA: Diagnosis not present

## 2024-06-03 DIAGNOSIS — J9611 Chronic respiratory failure with hypoxia: Secondary | ICD-10-CM

## 2024-06-03 DIAGNOSIS — G8222 Paraplegia, incomplete: Secondary | ICD-10-CM | POA: Diagnosis not present

## 2024-06-03 DIAGNOSIS — R1319 Other dysphagia: Secondary | ICD-10-CM

## 2024-06-03 NOTE — Patient Instructions (Addendum)
 _______________________________________________________  If your blood pressure at your visit was 140/90 or greater, please contact your primary care physician to follow up on this.  _______________________________________________________  If you are age 53 or older, your body mass index should be between 23-30. Your Body mass index is 29.35 kg/m. If this is out of the aforementioned range listed, please consider follow up with your Primary Care Provider.  If you are age 16 or younger, your body mass index should be between 19-25. Your Body mass index is 29.35 kg/m. If this is out of the aformentioned range listed, please consider follow up with your Primary Care Provider.   ________________________________________________________  The [facility] providers would like to encourage you to use MYCHART to communicate with providers for non-urgent requests or questions.  Due to long hold times on the telephone, sending your provider a message by [facility] may be a faster and more efficient way to get a response.  Please allow 48 business hours for a response.  Please remember that this is for non-urgent requests.  _______________________________________________________  We have referred you Interventional Radiology.  Someone should contact you with an appointment.  We have referred Infectious Disease.  Someone should contact you with an appointment.  You have been scheduled for an endoscopy. Please follow written instructions given to you at your visit today.   If you use inhalers (even only as needed), please bring them with you on the day of your procedure.  If you take any of the following medications, they will need to be adjusted prior to your procedure:   DO NOT TAKE 7 DAYS PRIOR TO TEST- Trulicity (dulaglutide) Ozempic, Wegovy (semaglutide) Mounjaro (tirzepatide) Bydureon Bcise (exanatide extended release)  DO NOT TAKE 1 DAY PRIOR TO YOUR TEST Rybelsus (semaglutide) Adlyxin  (lixisenatide) Victoza (liraglutide) Byetta (exanatide) ___________________________________________________________________________  Due to recent changes in healthcare laws, you may see the results of your imaging and laboratory studies on MyChart before your provider has had a chance to review them.  We understand that in some cases there may be results that are confusing or concerning to you. Not all laboratory results come back in the same time frame and the provider may be waiting for multiple results in order to interpret others.  Please give us  48 hours in order for your provider to thoroughly review all the results before contacting the office for clarification of your results.   [facility] is using a team-based approach to care.  Your team is made up of your doctor and two to three APPS. Our APPS (Nurse Practitioners and Physician Assistants) work with your physician to ensure care continuity for you. They are fully qualified to address your health concerns and develop a treatment plan. They communicate directly with your gastroenterologist to care for you. Seeing the Advanced Practice Practitioners on your physician's team can help you by facilitating care more promptly, often allowing for earlier appointments, access to diagnostic testing, procedures, and other specialty referrals.   It was a pleasure to see you today!  Vito Cirigliano, D.O.

## 2024-06-03 NOTE — Progress Notes (Signed)
 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 complicated by adrenal insufficiency, history of Hodgkin's at age 60 s/p chemoradiation, breast cancer s/p chemotherapy and bilateral mastectomy in 2000 which is in remission, cervical cancer s/p LEEP 2002, ovarian tumor precancerous s/p TH and left oophorectomy in 2011 and right in 2016, hx postablative hypothyroidism, ADHD, anxiety/depression and chronic hypoxemic respiratory failure secondary to unknown etiology (on 5L home O2), history of DVTs (will go on short term heparin  or lovenox )     08/01/2020 colonoscopy, recall 10 years Impression Nonthrombosed external hemorrhoids found on perianal exam Internal hemorrhoids The examination was otherwise normal No specimens collected. 08/01/2020 EGD Findings Normal esophagus 3 cm hiatal hernia Multiple fundic gland polyps Normal examined duodenum No source of hematemesis is identified Biopsies of the greater curvature of the gastric body, lesser curvature of the gastric body, at the incisura, on the greater curvature of the gastric atrium and on the lesser curvature of the gastric atrium. Path stomach biopsy Atrial and oxyntic mucosa with no significant pathologic findings.  Negative for H. Pylori. 09/16/23 MBSS-  Clinical Impression:  Patient presents with a normal oropharyngeal swallow however she does present with a suspected primary esophageal dysphagia as per this modified barium swallow study. 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 Thicker barium consistencies (honey thick, pudding thick and mechanical soft solids did transit a little slowly through PES and upper esophagus but no retrograde movement or stasis of barium visualized in upper esophagus or at level of  PES. Patient attempted to swallow barium tablet with plain water  but she gagged while tablet still in mouth and spit it out. She reports this is something that can occur without warning when she is eating or taking medications. No aspiration or penetration observed and oral and pharyngeal structures all appeared to be [facility]. SLP recommended patient see an outpatient SLP for help with reintroducing solid foods into her diet.  12/07/23 CT Soft tissue Neck WO contrast IMPRESSION: 1. No acute or focal lesion to explain the patient's symptoms. No radiopaque foreign body or soft tissue nodule to account for a medicine tablet or capsule in the neck. 2. Ill-defined airspace opacities in the right upper lobe raise concern for pneumonia.   11/15/2010 EGD    HPI:     Patient is a 53 y.o. female presenting to the Gastroenterology Clinic for continued evaluation of dysphagia and vomiting.  Has a choking sensation and emesis.  Can sometimes see scant BRB mixed in emesis.  Vomiting/regurgitating recently ingested foods.  - 12/26/2023: EGD: 2 cm hiatal hernia, severe esophageal candidiasis, suspected benign-appearing esophageal stenosis in the proximal esophagus not dilated due to severe candidiasis, benign fundic gland polyps, gastric AVM treated with APC - 02/11/2024: EGD: Benign-appearing esophageal stenosis dilated with endoscope:, esophageal candidiasis (improved from previous), 2 cm hiatal hernia, fundic gland polyps -04/08/2024: Follow-up with Deanna May in the GI clinic. -04/13/2024: EGD: Benign-appearing stenosis at 16 cm dilated with 16 mm Savary with appropriate mucosal disruption, subtle white plaques in the lower esophagus, 1-2 cm hiatal hernia, fundic gland polyps  Scheduled for esophageal manometry and pH/impedance study (on PPI) on 08/05/2024, but doubtful that she can tolerate those procedures.  She is on long-term steroids for her Addison's and has had multiple courses of antibiotics.  Currently on  Dexilant   60 mg daily, famotidine  liquid at bedtime, along with liquid Carafate , GI cocktail on demand.  Not keeping meds down for the last few days. Maintaining liquid diet.  Still with recurrent emesis, typically shortly after p.o. ingestion.  Has persistent nausea which is most responsive to Benadryl  via PICC.  Tearful during appointment today.  Hospital admission 8/18-22 for CAP with acute on chronic respiratory failure 2/2 rhinovirus on culture.  Course complicated by anemia without overt bleeding, transfused 2 units RBCs.  Prior allergy to iron supplement.  Review of systems:     No chest pain, no SOB, no fevers, no urinary sx   Past Medical History:  Diagnosis Date   Addison's disease (HCC)    Adrenal insufficiency (HCC)    Anemia    Anxiety    Aortic stenosis    Aortic stenosis    Appendicitis 12/19/2009   Appendicitis    Breast cancer (HCC)    STATUS POST BILATERAL MASTECTOMY. STATUS POST RECONSTRUCTION. SHE HAD SILICONE BREAST IMPLANTS AND THE LEFT IMPLANT IS LEAKING SLIGHTLY   Cellulitis of right middle finger 11/07/2018   Cervical cancer (HCC) 12/23/2018   Chest pain    CHF with right heart failure (HCC) 04/17/2017   Chronic respiratory failure with hypoxia (HCC) 12/23/2018   CKD (chronic kidney disease) stage 3, GFR 30-59 ml/min (HCC)    Cough variant asthma 04/13/2019   Depression    Functional neurological symptom disorder with mixed symptoms 2023   GERD (gastroesophageal reflux disease)    takes Dexilant  and carafate  and gi coctail    Headache    migraines on a daily and monthly regimen    Heart murmur    History of kidney stones    Hodgkin lymphoma (HCC)    STATUS POST MANTLE RADIATION   Hodgkin's lymphoma (HCC)    1987   Hypertension    Hypoxia    Multiple lung nodules    bilateral   Necrotizing fasciitis (HCC) 12/23/2018   Non-ischemic cardiomyopathy (HCC)    Osteoporosis    Ovarian tumor (benign)    bilateral   Palpitations    Pituitary adenoma  (HCC) 12/23/2018   Pneumonia    PONV (postoperative nausea and vomiting)    Pre-diabetes    per pt; no meds   Pulmonary hypertension (HCC) 12/23/2018   Raynaud phenomenon    Right heart failure (HCC) 04/17/2017   Seizures (HCC)    last febrile seizure was approx 3 weeks ago per report on 12/01/2020   Supplemental oxygen  dependent    3 liters   SVT (supraventricular tachycardia) (HCC)    Tachycardia    Thyroid  cancer (HCC)    STATUS POST SURGICAL REMOVAL-CURRENT ON THYROID  REPLACEMENT    Patient's surgical history, family medical history, social history, medications and allergies were all reviewed in Epic    Current Outpatient Medications  Medication Sig Dispense Refill   albuterol  (PROVENTIL ) (2.5 MG/3ML) 0.083% nebulizer solution INHALE 3 ML BY NEBULIZATION EVERY 6 HOURS AS NEEDED FOR WHEEZING OR SHORTNESS OF BREATH 225 mL 1   albuterol  (VENTOLIN  HFA) 108 (90 Base) MCG/ACT inhaler Inhale 2 puffs into the lungs every 6 (six) hours as needed for wheezing or shortness of breath. 8 g 6   aspirin  EC 81 MG tablet Take 1 tablet (81 mg total) by mouth daily. Swallow whole. (Patient taking differently: Take 81 mg by mouth daily. Chewable) 90 tablet 3   Bismuth Tribromoph-Petrolatum (XEROFORM OCCLUSIVE GAUZE STRIP) PADS Apply 1 each topically as directed. 50 each 1  bumetanide  (BUMEX ) 1 MG tablet Take 1 tablet (1 mg total) by mouth 2 (two) times daily. 180 tablet 3   buPROPion  (WELLBUTRIN  XL) 150 MG 24 hr tablet Take 1 tablet (150 mg total) by mouth daily. 30 tablet 2   clonazePAM  (KLONOPIN ) 1 MG tablet TAKE 1 TABLET BY MOUTH 3 TIMES A DAY AS NEEDED AND 1 EXTRA FOR SEVERE ANXIETY UP TO 10 A MONTH. 100 tablet 0   cyclobenzaprine  (FLEXERIL ) 10 MG tablet Take 1 tablet (10 mg total) by mouth 2 (two) times daily as needed for muscle spasms. 20 tablet 0   dexlansoprazole  (DEXILANT ) 60 MG capsule Take 1 capsule (60 mg total) by mouth daily. 90 capsule 5   diclofenac  (VOLTAREN ) 50 MG EC tablet Take 50  mg by mouth 2 (two) times daily as needed (can repeat if needed).     diphenhydrAMINE  (BENADRYL ) 50 MG/ML injection Inject 25 mg into the vein 5 (five) times daily as needed (for nausea  (is 50 mg/ml)).     EPINEPHRINE  0.3 mg/0.3 mL IJ SOAJ injection INJECT 0.3 MG INTO THE MUSCLE AS NEEDED FOR ANAPHYLAXIS. 2 each 1   famotidine  (PEPCID ) 40 MG/5ML suspension Take 2.5 mLs (20 mg total) by mouth daily. (Patient taking differently: Take 20 mg by mouth daily as needed for heartburn or indigestion.) 75 mL 6   FLORASTOR 250 MG capsule Take 250 mg by mouth 2 (two) times daily.     fluconazole  (DIFLUCAN ) 200 MG tablet Take 2 tablets (400 mg total) by mouth daily for 1 day, THEN 1 tablet (200 mg total) daily for 20 days. 22 tablet 0   FLUoxetine  (PROZAC ) 40 MG capsule Take 1 capsule (40 mg total) by mouth daily. 30 capsule 2   fluticasone  furoate-vilanterol (BREO ELLIPTA ) 100-25 MCG/ACT AEPB Inhale 2 puffs into the lungs daily.     fluticasone -salmeterol (ADVAIR HFA) 230-21 MCG/ACT inhaler Inhale 2 puffs into the lungs 2 (two) times daily. (Patient taking differently: Inhale 2 puffs into the lungs 2 (two) times daily as needed (for flares).) 1 each 11   Galcanezumab -gnlm (EMGALITY  [place]) Inject into the skin every 30 (thirty) days.     guaiFENesin  (ROBITUSSIN) 100 MG/5ML liquid Take 10 mLs by mouth every 6 (six) hours as needed for cough or to loosen phlegm. 120 mL 0   Heparin  Na, Pork, Lock Flsh PF (BD HEPARIN  POSIFLUSH) 100 UNIT/ML SOLN USE 5 MLS IN PORT A CATH ONCE DAILY AFTER MEDICATION ADMINISTRATION AS A HEPLOCK AS DIRECTED. 150 mL 3   hydrocortisone  (CORTEF ) 10 MG tablet Take 1-2 tablets (10-20 mg total) by mouth See admin instructions. Take 20 mg in the am and 10mg  in the evening. Take after completing prednisone  (Patient taking differently: Take 10-20 mg by mouth See admin instructions. Take 20 mg by mouth in the morning and 10 mg in the afternoon.)     hydrOXYzine  (ATARAX ) 10 MG/5ML syrup Take 12.5 mLs (25  mg total) by mouth 3 (three) times daily. 240 mL 0   hyoscyamine  (LEVSIN  SL) 0.125 MG SL tablet Place 1 tablet (0.125 mg total) under the tongue every 6 (six) hours as needed for cramping (esophageal spasm). Up to 1.25 mg daily 30 tablet 1   ipratropium-albuterol  (DUONEB) 0.5-2.5 (3) MG/3ML SOLN Take 3 mLs by nebulization every 6 (six) hours as needed (for shortness of breath or wheezing).     lamoTRIgine  (LAMICTAL ) 200 MG tablet TAKE 1 TABLET BY MOUTH EVERYDAY AT BEDTIME 30 tablet 2   lamoTRIgine  (LAMICTAL ) 25 MG tablet  Take two tablets every morning. 60 tablet 2   lidocaine  (LIDODERM ) 5 % Place 1 patch onto the skin daily. Remove & Discard patch within 12 hours or as directed by MD 30 patch 0   lidocaine  (XYLOCAINE ) 2 % solution Use as directed 10 mLs in the mouth or throat every 4 (four) hours as needed (for esophageal pain and pain with swallowing.). 100 mL 1   nitroGLYCERIN  (NITROSTAT ) 0.4 MG SL tablet DISSOLVE 1 TAB UNDER THE TONGUE EVERY 5 MINUTES AS NEEDED FOR CHEST PAIN. MAX OF 3 DOSES, THEN 911. 75 tablet 0   NURTEC 75 MG TBDP Take 75 mg by mouth daily as needed (migraines).     OLANZapine  (ZYPREXA ) 5 MG tablet Take 1 tablet (5 mg total) by mouth at bedtime. 30 tablet 2   OXYGEN  Inhale 3-5 L/min into the lungs continuous.     potassium chloride  (KLOR-CON ) 20 MEQ packet Take 20 mEq by mouth daily. Can be mixed in the juice for better test 30 packet 11   QVAR  REDIHALER 80 MCG/ACT inhaler Inhale 2 puffs into the lungs daily.     rosuvastatin  (CRESTOR ) 10 MG tablet Take 10 mg by mouth in the morning. Mid morning     sacubitril -valsartan  (ENTRESTO ) 24-26 MG Take 1 tablet by mouth 2 (two) times daily.     sodium chloride  flush (BD POSIFLUSH) 0.9 % SOLN injection Use as directed for catheter maintenance 600 mL 3   Sodium Chloride  Flush (NORMAL SALINE FLUSH) 0.9 % SOLN Use as directed for catheter maintenance. 600 mL 2   Sodium Chloride  Flush (NORMAL SALINE FLUSH) 0.9 % SOLN Use 10mls as directed  for catheter maintenance 600 mL 3   sucralfate  (CARAFATE ) 1 GM/10ML suspension Take 1 g by mouth daily as needed (as directed for ulcers).     SYNTHROID  100 MCG tablet Take 1 tablet (100 mcg total) by mouth every morning. (Patient taking differently: Take 100 mcg by mouth every morning.  Must be name brand*)     Tiotropium Bromide  Monohydrate (SPIRIVA  RESPIMAT) 1.25 MCG/ACT AERS Inhale 2 puffs into the lungs daily. 4 g 11   topiramate  (TOPAMAX ) 50 MG tablet Take 150 mg by mouth at bedtime.     zolpidem  (AMBIEN  CR) 12.5 MG CR tablet TAKE ONE TABLET BY MOUTH AT BEDTIME AS NEEDED FOR SLEEP. (Patient taking differently: Take 12.5 mg by mouth at bedtime.) 90 tablet 0   No current facility-administered medications for this visit.    Physical Exam:     BP 110/70 (BP Location: Right Arm, Patient Position: Sitting)   Pulse (!) 104   Ht 5' 4 (1.626 m)   Wt 171 lb (77.6 kg) Comment: pt stated weight-refused to weigh  LMP  (LMP Unknown)   BMI 29.35 kg/m   GENERAL:  Pleasant female in NAD, nasal cannula in place. PSYCH: : Cooperative, normal affect, well conversive NEURO: Alert and oriented x 3   IMPRESSION and PLAN:    1) Nausea/Vomiting 2) Recurrent Esophageal Candidiasis 3) Esophageal stenosis Very difficult clinical situation.  She has recurrent nausea/vomiting which is likely multifactorial, partly related to underlying chronic medical conditions, polypharmacy, but also a component of proximal esophageal stricture and recurrent candidiasis playing a role.  Last upper endoscopy did show resolution of the candidiasis but biopsies with increased esophageal lymphocytes. 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 reflux, possibly recurrent infection, immunosuppression?).  We had  an in-depth conversation today regarding her various GI issues.  One of my main concerns  is that she frequently will vomit shortly after ingestion of pills, so we do not know how much of her medication she is actually taking.  I do wonder if her Addison's is suboptimally controlled due to unreliable steroid dosing due to medication emesis.  Additionally, she does not seem to be able to reliably take an enough nutrition, both due to dysphagia and constant nausea.  I am curious if she would be a good candidate for gastric feeding tube placement as a means to more reliably administer enteral medication and maintain more adequate nutrition.  I did express I have concerns about history of poor wound healing and PEG tube placement, which she understands and would like to proceed as follows:  - Plan to proceed with EGD when pulmonary status allows.  Will need to be scheduled at [facility] Endoscopy unit with me for reevaluation and repeat esophageal dilation as appropriate along with repeat esophageal biopsies - Referral to the Infectious Disease clinic to assist in treatment of recurrent Esophageal Candidiasis - Referral to IR for consideration of palliative feeding tube placement to aid in medication administration and nutrition - Tentatively scheduled for Esophageal Manometry and pH/impedance study in early November, although not sure she will be able to tolerate this study  4) Addison's Disease - Will need stress dose steroids for endoscopic procedure  5) Chronic hypoxic respiratory failure on home O2 6) CHF 7) CKD 3 - Procedures scheduled at [facility] unit due to elevated periprocedural risks from underlying comorbidities  8) Anxiety/depression - Follows in the [facility] clinic      I spent 45 minutes of time, including in depth chart review, independent review of results as outlined above, communicating results with the patient directly, face-to-face time with the patient, coordinating care, ordering studies and medications as appropriate, and documentation.       Sandor GAILS  Janete Quilling ,DO, FACG 06/03/2024, 11:10 AM

## 2024-06-08 ENCOUNTER — Other Ambulatory Visit (HOSPITAL_COMMUNITY): Payer: Self-pay

## 2024-06-08 ENCOUNTER — Encounter: Payer: Self-pay | Admitting: Hematology and Oncology

## 2024-06-08 NOTE — Telephone Encounter (Signed)
 PA approve with [employer] for Clonazepam  1 mg #100/30 day, EJ#[id number]  06/05/24-06/05/25

## 2024-06-09 ENCOUNTER — Other Ambulatory Visit (HOSPITAL_COMMUNITY): Payer: Self-pay

## 2024-06-09 ENCOUNTER — Encounter: Payer: Self-pay | Admitting: Hematology and Oncology

## 2024-06-19 ENCOUNTER — Other Ambulatory Visit (HOSPITAL_COMMUNITY): Payer: Self-pay

## 2024-06-19 ENCOUNTER — Ambulatory Visit (INDEPENDENT_AMBULATORY_CARE_PROVIDER_SITE_OTHER): Payer: Self-pay | Admitting: Psychiatry

## 2024-06-19 ENCOUNTER — Encounter: Payer: Self-pay | Admitting: Hematology and Oncology

## 2024-06-19 ENCOUNTER — Telehealth: Payer: Self-pay | Admitting: Gastroenterology

## 2024-06-19 DIAGNOSIS — F411 Generalized anxiety disorder: Secondary | ICD-10-CM

## 2024-06-19 NOTE — Telephone Encounter (Signed)
 Patient requesting f/u call to discuss various gi symptoms she is having. Please advise.   Thank you

## 2024-06-19 NOTE — Progress Notes (Signed)
   Patient no-showed for appt after reminders provided.

## 2024-06-21 ENCOUNTER — Other Ambulatory Visit: Payer: Self-pay

## 2024-06-21 ENCOUNTER — Inpatient Hospital Stay (HOSPITAL_BASED_OUTPATIENT_CLINIC_OR_DEPARTMENT_OTHER)
Admission: EM | Admit: 2024-06-21 | Discharge: 2024-07-01 | DRG: 368 | Disposition: E | Attending: Internal Medicine | Admitting: Internal Medicine

## 2024-06-21 ENCOUNTER — Encounter (HOSPITAL_BASED_OUTPATIENT_CLINIC_OR_DEPARTMENT_OTHER): Payer: Self-pay

## 2024-06-21 ENCOUNTER — Emergency Department (HOSPITAL_BASED_OUTPATIENT_CLINIC_OR_DEPARTMENT_OTHER): Admitting: Radiology

## 2024-06-21 ENCOUNTER — Emergency Department (HOSPITAL_BASED_OUTPATIENT_CLINIC_OR_DEPARTMENT_OTHER)

## 2024-06-21 DIAGNOSIS — R627 Adult failure to thrive: Secondary | ICD-10-CM | POA: Diagnosis present

## 2024-06-21 DIAGNOSIS — R638 Other symptoms and signs concerning food and fluid intake: Secondary | ICD-10-CM

## 2024-06-21 DIAGNOSIS — E860 Dehydration: Secondary | ICD-10-CM | POA: Diagnosis present

## 2024-06-21 DIAGNOSIS — R112 Nausea with vomiting, unspecified: Secondary | ICD-10-CM

## 2024-06-21 DIAGNOSIS — K644 Residual hemorrhoidal skin tags: Secondary | ICD-10-CM | POA: Diagnosis present

## 2024-06-21 DIAGNOSIS — Z6829 Body mass index (BMI) 29.0-29.9, adult: Secondary | ICD-10-CM

## 2024-06-21 DIAGNOSIS — I5082 Biventricular heart failure: Secondary | ICD-10-CM | POA: Diagnosis not present

## 2024-06-21 DIAGNOSIS — Z90710 Acquired absence of both cervix and uterus: Secondary | ICD-10-CM

## 2024-06-21 DIAGNOSIS — I700 Atherosclerosis of aorta: Secondary | ICD-10-CM | POA: Diagnosis not present

## 2024-06-21 DIAGNOSIS — R1110 Vomiting, unspecified: Secondary | ICD-10-CM | POA: Diagnosis not present

## 2024-06-21 DIAGNOSIS — G9341 Metabolic encephalopathy: Secondary | ICD-10-CM | POA: Diagnosis not present

## 2024-06-21 DIAGNOSIS — R7401 Elevation of levels of liver transaminase levels: Secondary | ICD-10-CM | POA: Diagnosis not present

## 2024-06-21 DIAGNOSIS — F419 Anxiety disorder, unspecified: Secondary | ICD-10-CM | POA: Diagnosis present

## 2024-06-21 DIAGNOSIS — I130 Hypertensive heart and chronic kidney disease with heart failure and stage 1 through stage 4 chronic kidney disease, or unspecified chronic kidney disease: Secondary | ICD-10-CM | POA: Diagnosis not present

## 2024-06-21 DIAGNOSIS — B3781 Candidal esophagitis: Secondary | ICD-10-CM | POA: Diagnosis not present

## 2024-06-21 DIAGNOSIS — J159 Unspecified bacterial pneumonia: Secondary | ICD-10-CM | POA: Diagnosis not present

## 2024-06-21 DIAGNOSIS — J954 Chemical pneumonitis due to anesthesia: Secondary | ICD-10-CM | POA: Diagnosis not present

## 2024-06-21 DIAGNOSIS — K449 Diaphragmatic hernia without obstruction or gangrene: Secondary | ICD-10-CM | POA: Diagnosis present

## 2024-06-21 DIAGNOSIS — Z90721 Acquired absence of ovaries, unilateral: Secondary | ICD-10-CM

## 2024-06-21 DIAGNOSIS — Z89022 Acquired absence of left finger(s): Secondary | ICD-10-CM

## 2024-06-21 DIAGNOSIS — Z7982 Long term (current) use of aspirin: Secondary | ICD-10-CM

## 2024-06-21 DIAGNOSIS — R0602 Shortness of breath: Secondary | ICD-10-CM

## 2024-06-21 DIAGNOSIS — Z9882 Breast implant status: Secondary | ICD-10-CM

## 2024-06-21 DIAGNOSIS — K429 Umbilical hernia without obstruction or gangrene: Secondary | ICD-10-CM | POA: Diagnosis not present

## 2024-06-21 DIAGNOSIS — R52 Pain, unspecified: Secondary | ICD-10-CM | POA: Diagnosis not present

## 2024-06-21 DIAGNOSIS — Z8541 Personal history of malignant neoplasm of cervix uteri: Secondary | ICD-10-CM

## 2024-06-21 DIAGNOSIS — J690 Pneumonitis due to inhalation of food and vomit: Secondary | ICD-10-CM | POA: Diagnosis present

## 2024-06-21 DIAGNOSIS — Y849 Medical procedure, unspecified as the cause of abnormal reaction of the patient, or of later complication, without mention of misadventure at the time of the procedure: Secondary | ICD-10-CM

## 2024-06-21 DIAGNOSIS — E2740 Unspecified adrenocortical insufficiency: Secondary | ICD-10-CM | POA: Diagnosis not present

## 2024-06-21 DIAGNOSIS — K317 Polyp of stomach and duodenum: Secondary | ICD-10-CM | POA: Diagnosis present

## 2024-06-21 DIAGNOSIS — I5032 Chronic diastolic (congestive) heart failure: Secondary | ICD-10-CM | POA: Diagnosis not present

## 2024-06-21 DIAGNOSIS — Z85850 Personal history of malignant neoplasm of thyroid: Secondary | ICD-10-CM

## 2024-06-21 DIAGNOSIS — J9621 Acute and chronic respiratory failure with hypoxia: Secondary | ICD-10-CM | POA: Diagnosis not present

## 2024-06-21 DIAGNOSIS — K222 Esophageal obstruction: Secondary | ICD-10-CM | POA: Diagnosis present

## 2024-06-21 DIAGNOSIS — R918 Other nonspecific abnormal finding of lung field: Secondary | ICD-10-CM | POA: Diagnosis not present

## 2024-06-21 DIAGNOSIS — M19012 Primary osteoarthritis, left shoulder: Secondary | ICD-10-CM | POA: Diagnosis not present

## 2024-06-21 DIAGNOSIS — K769 Liver disease, unspecified: Secondary | ICD-10-CM | POA: Diagnosis not present

## 2024-06-21 DIAGNOSIS — E890 Postprocedural hypothyroidism: Secondary | ICD-10-CM | POA: Diagnosis present

## 2024-06-21 DIAGNOSIS — Z9981 Dependence on supplemental oxygen: Secondary | ICD-10-CM

## 2024-06-21 DIAGNOSIS — Z79899 Other long term (current) drug therapy: Secondary | ICD-10-CM

## 2024-06-21 DIAGNOSIS — K3189 Other diseases of stomach and duodenum: Secondary | ICD-10-CM | POA: Diagnosis not present

## 2024-06-21 DIAGNOSIS — Z6828 Body mass index (BMI) 28.0-28.9, adult: Secondary | ICD-10-CM

## 2024-06-21 DIAGNOSIS — Z8571 Personal history of Hodgkin lymphoma: Secondary | ICD-10-CM

## 2024-06-21 DIAGNOSIS — J9601 Acute respiratory failure with hypoxia: Secondary | ICD-10-CM

## 2024-06-21 DIAGNOSIS — R1312 Dysphagia, oropharyngeal phase: Secondary | ICD-10-CM | POA: Diagnosis present

## 2024-06-21 DIAGNOSIS — N289 Disorder of kidney and ureter, unspecified: Secondary | ICD-10-CM | POA: Diagnosis not present

## 2024-06-21 DIAGNOSIS — R079 Chest pain, unspecified: Secondary | ICD-10-CM | POA: Diagnosis not present

## 2024-06-21 DIAGNOSIS — L97518 Non-pressure chronic ulcer of other part of right foot with other specified severity: Secondary | ICD-10-CM | POA: Diagnosis not present

## 2024-06-21 DIAGNOSIS — Z7951 Long term (current) use of inhaled steroids: Secondary | ICD-10-CM

## 2024-06-21 DIAGNOSIS — N179 Acute kidney failure, unspecified: Principal | ICD-10-CM | POA: Diagnosis present

## 2024-06-21 DIAGNOSIS — R296 Repeated falls: Secondary | ICD-10-CM | POA: Diagnosis present

## 2024-06-21 DIAGNOSIS — L97519 Non-pressure chronic ulcer of other part of right foot with unspecified severity: Secondary | ICD-10-CM | POA: Diagnosis not present

## 2024-06-21 DIAGNOSIS — R54 Age-related physical debility: Secondary | ICD-10-CM | POA: Diagnosis present

## 2024-06-21 DIAGNOSIS — R4589 Other symptoms and signs involving emotional state: Secondary | ICD-10-CM | POA: Diagnosis not present

## 2024-06-21 DIAGNOSIS — E230 Hypopituitarism: Secondary | ICD-10-CM | POA: Diagnosis present

## 2024-06-21 DIAGNOSIS — D649 Anemia, unspecified: Secondary | ICD-10-CM | POA: Diagnosis not present

## 2024-06-21 DIAGNOSIS — Z7952 Long term (current) use of systemic steroids: Secondary | ICD-10-CM

## 2024-06-21 DIAGNOSIS — R1319 Other dysphagia: Secondary | ICD-10-CM | POA: Diagnosis not present

## 2024-06-21 DIAGNOSIS — Z66 Do not resuscitate: Secondary | ICD-10-CM | POA: Diagnosis not present

## 2024-06-21 DIAGNOSIS — Z86718 Personal history of other venous thrombosis and embolism: Secondary | ICD-10-CM

## 2024-06-21 DIAGNOSIS — M810 Age-related osteoporosis without current pathological fracture: Secondary | ICD-10-CM | POA: Diagnosis present

## 2024-06-21 DIAGNOSIS — Z79890 Hormone replacement therapy (postmenopausal): Secondary | ICD-10-CM

## 2024-06-21 DIAGNOSIS — Z8701 Personal history of pneumonia (recurrent): Secondary | ICD-10-CM

## 2024-06-21 DIAGNOSIS — K5900 Constipation, unspecified: Secondary | ICD-10-CM | POA: Diagnosis not present

## 2024-06-21 DIAGNOSIS — Z7189 Other specified counseling: Secondary | ICD-10-CM

## 2024-06-21 DIAGNOSIS — F32A Depression, unspecified: Secondary | ICD-10-CM | POA: Diagnosis present

## 2024-06-21 DIAGNOSIS — R0603 Acute respiratory distress: Secondary | ICD-10-CM | POA: Diagnosis not present

## 2024-06-21 DIAGNOSIS — N183 Chronic kidney disease, stage 3 unspecified: Secondary | ICD-10-CM | POA: Diagnosis not present

## 2024-06-21 DIAGNOSIS — M25512 Pain in left shoulder: Secondary | ICD-10-CM | POA: Diagnosis not present

## 2024-06-21 DIAGNOSIS — J9811 Atelectasis: Secondary | ICD-10-CM | POA: Diagnosis present

## 2024-06-21 DIAGNOSIS — R1310 Dysphagia, unspecified: Secondary | ICD-10-CM | POA: Diagnosis not present

## 2024-06-21 DIAGNOSIS — K2950 Unspecified chronic gastritis without bleeding: Secondary | ICD-10-CM | POA: Diagnosis not present

## 2024-06-21 DIAGNOSIS — K2970 Gastritis, unspecified, without bleeding: Secondary | ICD-10-CM | POA: Diagnosis present

## 2024-06-21 DIAGNOSIS — Z452 Encounter for adjustment and management of vascular access device: Secondary | ICD-10-CM | POA: Diagnosis not present

## 2024-06-21 DIAGNOSIS — Z923 Personal history of irradiation: Secondary | ICD-10-CM

## 2024-06-21 DIAGNOSIS — Z9013 Acquired absence of bilateral breasts and nipples: Secondary | ICD-10-CM

## 2024-06-21 DIAGNOSIS — Z853 Personal history of malignant neoplasm of breast: Secondary | ICD-10-CM

## 2024-06-21 DIAGNOSIS — I2721 Secondary pulmonary arterial hypertension: Secondary | ICD-10-CM | POA: Diagnosis present

## 2024-06-21 DIAGNOSIS — Z515 Encounter for palliative care: Secondary | ICD-10-CM | POA: Diagnosis not present

## 2024-06-21 DIAGNOSIS — K648 Other hemorrhoids: Secondary | ICD-10-CM | POA: Diagnosis present

## 2024-06-21 DIAGNOSIS — Z9221 Personal history of antineoplastic chemotherapy: Secondary | ICD-10-CM

## 2024-06-21 DIAGNOSIS — E43 Unspecified severe protein-calorie malnutrition: Secondary | ICD-10-CM | POA: Diagnosis not present

## 2024-06-21 DIAGNOSIS — Z5986 Financial insecurity: Secondary | ICD-10-CM

## 2024-06-21 DIAGNOSIS — E876 Hypokalemia: Secondary | ICD-10-CM | POA: Diagnosis present

## 2024-06-21 DIAGNOSIS — Z885 Allergy status to narcotic agent status: Secondary | ICD-10-CM

## 2024-06-21 DIAGNOSIS — T380X5A Adverse effect of glucocorticoids and synthetic analogues, initial encounter: Secondary | ICD-10-CM | POA: Diagnosis present

## 2024-06-21 DIAGNOSIS — Z79620 Long term (current) use of immunosuppressive biologic: Secondary | ICD-10-CM

## 2024-06-21 DIAGNOSIS — K219 Gastro-esophageal reflux disease without esophagitis: Secondary | ICD-10-CM | POA: Diagnosis present

## 2024-06-21 DIAGNOSIS — I509 Heart failure, unspecified: Secondary | ICD-10-CM | POA: Diagnosis not present

## 2024-06-21 DIAGNOSIS — Z723 Lack of physical exercise: Secondary | ICD-10-CM

## 2024-06-21 DIAGNOSIS — I7300 Raynaud's syndrome without gangrene: Secondary | ICD-10-CM | POA: Diagnosis present

## 2024-06-21 DIAGNOSIS — I428 Other cardiomyopathies: Secondary | ICD-10-CM | POA: Diagnosis present

## 2024-06-21 DIAGNOSIS — J984 Other disorders of lung: Secondary | ICD-10-CM | POA: Diagnosis not present

## 2024-06-21 DIAGNOSIS — E272 Addisonian crisis: Secondary | ICD-10-CM | POA: Diagnosis not present

## 2024-06-21 DIAGNOSIS — N200 Calculus of kidney: Secondary | ICD-10-CM | POA: Diagnosis present

## 2024-06-21 LAB — PROCALCITONIN: Procalcitonin: <0.1 ng/mL

## 2024-06-21 LAB — URINALYSIS, ROUTINE W REFLEX MICROSCOPIC
Bilirubin Urine: NEGATIVE
Glucose, UA: NEGATIVE mg/dL
Hgb urine dipstick: NEGATIVE
Ketones, ur: NEGATIVE mg/dL
Leukocytes,Ua: NEGATIVE
Nitrite: NEGATIVE
Protein, ur: NEGATIVE mg/dL
Specific Gravity, Urine: 1.009 (ref 1.005–1.030)
pH: 6 (ref 5.0–8.0)

## 2024-06-21 LAB — MAGNESIUM
Magnesium: 1.9 mg/dL (ref 1.7–2.4)
Magnesium: 2.2 mg/dL (ref 1.7–2.4)

## 2024-06-21 LAB — COMPREHENSIVE METABOLIC PANEL WITH GFR
ALT: 136 U/L — ABNORMAL HIGH (ref 0–44)
AST: 79 U/L — ABNORMAL HIGH (ref 15–41)
Albumin: 4.8 g/dL (ref 3.5–5.0)
Alkaline Phosphatase: 99 U/L (ref 38–126)
Anion gap: 18 — ABNORMAL HIGH (ref 5–15)
BUN: 27 mg/dL — ABNORMAL HIGH (ref 6–20)
CO2: 23 mmol/L (ref 22–32)
Calcium: 9.6 mg/dL (ref 8.9–10.3)
Chloride: 98 mmol/L (ref 98–111)
Creatinine, Ser: 1.93 mg/dL — ABNORMAL HIGH (ref 0.44–1.00)
GFR, Estimated: 30 mL/min — ABNORMAL LOW (ref >60)
Glucose, Bld: 129 mg/dL — ABNORMAL HIGH (ref 70–99)
Potassium: 2.6 mmol/L — CL (ref 3.5–5.1)
Sodium: 139 mmol/L (ref 135–145)
Total Bilirubin: 0.4 mg/dL (ref 0.0–1.2)
Total Protein: 8.5 g/dL — ABNORMAL HIGH (ref 6.5–8.1)

## 2024-06-21 LAB — PHOSPHORUS: Phosphorus: 2.8 mg/dL (ref 2.5–4.6)

## 2024-06-21 LAB — CBC WITH DIFFERENTIAL/PLATELET
Abs Immature Granulocytes: 0.02 K/uL (ref 0.00–0.07)
Basophils Absolute: 0.1 K/uL (ref 0.0–0.1)
Basophils Relative: 1 %
Eosinophils Absolute: 0.1 K/uL (ref 0.0–0.5)
Eosinophils Relative: 1 %
HCT: 38.5 % (ref 36.0–46.0)
Hemoglobin: 12.2 g/dL (ref 12.0–15.0)
Immature Granulocytes: 0 %
Lymphocytes Relative: 34 %
Lymphs Abs: 2.4 K/uL (ref 0.7–4.0)
MCH: 24.3 pg — ABNORMAL LOW (ref 26.0–34.0)
MCHC: 31.7 g/dL (ref 30.0–36.0)
MCV: 76.5 fL — ABNORMAL LOW (ref 80.0–100.0)
Monocytes Absolute: 0.7 K/uL (ref 0.1–1.0)
Monocytes Relative: 10 %
Neutro Abs: 3.9 K/uL (ref 1.7–7.7)
Neutrophils Relative %: 54 %
Platelets: 271 K/uL (ref 150–400)
RBC: 5.03 MIL/uL (ref 3.87–5.11)
RDW: 23.3 % — ABNORMAL HIGH (ref 11.5–15.5)
WBC: 7.2 K/uL (ref 4.0–10.5)
nRBC: 0 % (ref 0.0–0.2)

## 2024-06-21 LAB — BASIC METABOLIC PANEL WITH GFR
Anion gap: 17 — ABNORMAL HIGH (ref 5–15)
BUN: 26 mg/dL — ABNORMAL HIGH (ref 6–20)
CO2: 17 mmol/L — ABNORMAL LOW (ref 22–32)
Calcium: 8.3 mg/dL — ABNORMAL LOW (ref 8.9–10.3)
Chloride: 103 mmol/L (ref 98–111)
Creatinine, Ser: 1.56 mg/dL — ABNORMAL HIGH (ref 0.44–1.00)
GFR, Estimated: 39 mL/min — ABNORMAL LOW (ref >60)
Glucose, Bld: 110 mg/dL — ABNORMAL HIGH (ref 70–99)
Potassium: 3.6 mmol/L (ref 3.5–5.1)
Sodium: 137 mmol/L (ref 135–145)

## 2024-06-21 LAB — CBC
HCT: 42.2 % (ref 36.0–46.0)
Hemoglobin: 12 g/dL (ref 12.0–15.0)
MCH: 23.4 pg — ABNORMAL LOW (ref 26.0–34.0)
MCHC: 28.4 g/dL — ABNORMAL LOW (ref 30.0–36.0)
MCV: 82.4 fL (ref 80.0–100.0)
Platelets: 222 K/uL (ref 150–400)
RBC: 5.12 MIL/uL — ABNORMAL HIGH (ref 3.87–5.11)
RDW: 23.5 % — ABNORMAL HIGH (ref 11.5–15.5)
WBC: 5.7 K/uL (ref 4.0–10.5)
nRBC: 0 % (ref 0.0–0.2)

## 2024-06-21 LAB — LIPASE, BLOOD: Lipase: 62 U/L — ABNORMAL HIGH (ref 11–51)

## 2024-06-21 MED ORDER — DIPHENHYDRAMINE HCL 50 MG/ML IJ SOLN
25.0000 mg | Freq: Once | INTRAMUSCULAR | Status: AC
  Administered 2024-06-21: 25 mg via INTRAVENOUS
  Filled 2024-06-21: qty 1

## 2024-06-21 MED ORDER — SODIUM CHLORIDE 0.9% FLUSH: 10.0000 mL | INTRAVENOUS | Status: DC | PRN

## 2024-06-21 MED ORDER — CHLORHEXIDINE GLUCONATE CLOTH 2 % EX PADS
6.0000 | MEDICATED_PAD | Freq: Every day | CUTANEOUS | Status: DC
  Administered 2024-06-22 – 2024-06-24 (×4): 6 via TOPICAL

## 2024-06-21 MED ORDER — ALBUTEROL SULFATE (2.5 MG/3ML) 0.083% IN NEBU
2.5000 mg | INHALATION_SOLUTION | RESPIRATORY_TRACT | Status: DC | PRN
  Administered 2024-06-23 – 2024-06-24 (×4): 2.5 mg via RESPIRATORY_TRACT
  Filled 2024-06-21 (×3): qty 3

## 2024-06-21 MED ORDER — HYDROCORTISONE SOD SUC (PF) 100 MG IJ SOLR
100.0000 mg | Freq: Three times a day (TID) | INTRAMUSCULAR | Status: DC
  Administered 2024-06-21 – 2024-06-27 (×17): 100 mg via INTRAVENOUS
  Filled 2024-06-21 (×18): qty 2

## 2024-06-21 MED ORDER — MAGNESIUM SULFATE 2 GM/50ML IV SOLN
2.0000 g | Freq: Once | INTRAVENOUS | Status: AC
  Administered 2024-06-21: 2 g via INTRAVENOUS
  Filled 2024-06-21: qty 50

## 2024-06-21 MED ORDER — SODIUM CHLORIDE 0.9 % IV SOLN: INTRAVENOUS | Status: DC

## 2024-06-21 MED ORDER — HYDROCORTISONE SOD SUC (PF) 100 MG IJ SOLR
100.0000 mg | Freq: Once | INTRAMUSCULAR | Status: AC
  Administered 2024-06-21: 100 mg via INTRAVENOUS
  Filled 2024-06-21: qty 2

## 2024-06-21 MED ORDER — POTASSIUM CHLORIDE 10 MEQ/100ML IV SOLN
10.0000 meq | INTRAVENOUS | Status: AC
  Administered 2024-06-21 (×4): 10 meq via INTRAVENOUS
  Filled 2024-06-21 (×3): qty 100

## 2024-06-21 MED ORDER — DIPHENHYDRAMINE HCL 50 MG/ML IJ SOLN
25.0000 mg | Freq: Four times a day (QID) | INTRAMUSCULAR | Status: DC | PRN
  Administered 2024-06-22 – 2024-06-27 (×13): 25 mg via INTRAVENOUS
  Filled 2024-06-21 (×13): qty 1

## 2024-06-21 MED ORDER — HEPARIN SODIUM (PORCINE) 5000 UNIT/ML IJ SOLN
5000.0000 [IU] | Freq: Three times a day (TID) | INTRAMUSCULAR | Status: AC
  Administered 2024-06-21 – 2024-06-22 (×4): 5000 [IU] via SUBCUTANEOUS
  Filled 2024-06-21 (×4): qty 1

## 2024-06-21 MED ORDER — BISACODYL 10 MG RE SUPP: 10.0000 mg | Freq: Every day | RECTAL | Status: DC | PRN

## 2024-06-21 MED ORDER — ACETAMINOPHEN 325 MG PO TABS
650.0000 mg | ORAL_TABLET | Freq: Four times a day (QID) | ORAL | Status: DC | PRN
  Administered 2024-06-22 (×2): 650 mg via ORAL
  Filled 2024-06-21 (×2): qty 2

## 2024-06-21 MED ORDER — SODIUM CHLORIDE 0.9 % IV BOLUS
2000.0000 mL | Freq: Once | INTRAVENOUS | Status: AC
  Administered 2024-06-21: 2000 mL via INTRAVENOUS

## 2024-06-21 MED ORDER — SODIUM CHLORIDE 0.9% FLUSH
10.0000 mL | Freq: Two times a day (BID) | INTRAVENOUS | Status: DC
  Administered 2024-06-21 – 2024-06-23 (×4): 10 mL
  Administered 2024-06-23: 20 mL
  Administered 2024-06-24 – 2024-06-26 (×5): 10 mL
  Administered 2024-06-26: 20 mL
  Administered 2024-06-27: 10 mL

## 2024-06-21 MED ORDER — LORAZEPAM 2 MG/ML IJ SOLN
1.0000 mg | Freq: Once | INTRAMUSCULAR | Status: AC
  Administered 2024-06-21: 1 mg via INTRAVENOUS
  Filled 2024-06-21: qty 1

## 2024-06-21 MED ORDER — FENTANYL CITRATE PF 50 MCG/ML IJ SOSY
12.5000 ug | PREFILLED_SYRINGE | INTRAMUSCULAR | Status: DC | PRN
  Administered 2024-06-23 – 2024-06-24 (×4): 12.5 ug via INTRAVENOUS
  Filled 2024-06-21 (×4): qty 1

## 2024-06-21 MED ORDER — HYDRALAZINE HCL 20 MG/ML IJ SOLN: 5.0000 mg | Freq: Three times a day (TID) | INTRAMUSCULAR | Status: DC | PRN

## 2024-06-21 MED ORDER — ACETAMINOPHEN 650 MG RE SUPP: 650.0000 mg | Freq: Four times a day (QID) | RECTAL | Status: DC | PRN

## 2024-06-21 MED ORDER — LORAZEPAM 2 MG/ML IJ SOLN
0.5000 mg | Freq: Four times a day (QID) | INTRAMUSCULAR | Status: DC | PRN
  Administered 2024-06-22 – 2024-06-27 (×13): 0.5 mg via INTRAVENOUS
  Filled 2024-06-21 (×16): qty 1

## 2024-06-21 NOTE — ED Notes (Signed)
 [facility] in ED preparing pt for transfer

## 2024-06-21 NOTE — H&P (Signed)
 History and Physical    [name] DOB: 1970-11-06 DOA: 06/21/2024  PCP: Toppin, Jean M, MD  Patient coming from: home  I have personally briefly reviewed patient's old medical records in [facility]  Chief Complaint: n/v x 2 weeks ,after hospitalization for pna/spesis/addison crisis but worse over the last 4 days  HPI: [name] is a 53 y.o. female with medical history significant of  significant of Hodgkins Lymphoma, Pulmonary Artery Hypertension, Heart failure, Addisons Disease, Migraines who has interim history of admission 8/18- 05/22/24 for pna/spesis/ as well as addison crisis. Patient notes she has chronic n/v but typically controlled with bendryl( hx of intolerances to other medications) at home. Patient states since recent discharge she has had increase n/v however over the last 4 days it has been more persistent. She notes she is not able to    . She also notes associated abdominal pain    ED Course:  Afeb, BP 135/85, hr 95, rr 18 , sat 99%  EKG:nsr 94, LAFB , LVH Labs wbcL 7.2, hgb 12.2, plt 271,  Lipase 62 Na 139, K 2.6, cr 1.93 (0.89) Ast79,Alt136, AG 189 bicarb 23) Mag 1.9 Cxr : Airspace disease in the retrocardiac region on the left, possible residual pneumonia.   CTAB IMPRESSION: 1. No acute intra-abdominal process. 2. Bilateral nephrolithiasis without evidence of obstruction. 3. Complex lesion in in the lower pole of the left kidney with indeterminate imaging characteristics. Ultrasound or MRI is recommended for further characterization on follow-up. 4. Moderate amount of retained stool in the colon suggesting constipation. 5. Aortic atherosclerosis.   Tx solucortef 100mg  , Lorazepam  1mg  , potassium 10 meq, benadryl  25mg   Review of Systems: As per HPI otherwise 10 point review of systems negative.   Past Medical History:  Diagnosis Date   Addison's disease (HCC)    Adrenal insufficiency (HCC)    Anemia    Anxiety    Aortic  stenosis    Aortic stenosis    Appendicitis 12/19/2009   Appendicitis    Breast cancer (HCC)    STATUS POST BILATERAL MASTECTOMY. STATUS POST RECONSTRUCTION. SHE HAD SILICONE BREAST IMPLANTS AND THE LEFT IMPLANT IS LEAKING SLIGHTLY   Cellulitis of right middle finger 11/07/2018   Cervical cancer (HCC) 12/23/2018   Chest pain    CHF with right heart failure (HCC) 04/17/2017   Chronic respiratory failure with hypoxia (HCC) 12/23/2018   CKD (chronic kidney disease) stage 3, GFR 30-59 ml/min (HCC)    Cough variant asthma 04/13/2019   Depression    Functional neurological symptom disorder with mixed symptoms 2023   GERD (gastroesophageal reflux disease)    takes Dexilant  and carafate  and gi coctail    Headache    migraines on a daily and monthly regimen    Heart murmur    History of kidney stones    Hodgkin lymphoma (HCC)    STATUS POST MANTLE RADIATION   Hodgkin's lymphoma (HCC)    1987   Hypertension    Hypoxia    Multiple lung nodules    bilateral   Necrotizing fasciitis (HCC) 12/23/2018   Non-ischemic cardiomyopathy (HCC)    Osteoporosis    Ovarian tumor (benign)    bilateral   Palpitations    Pituitary adenoma (HCC) 12/23/2018   Pneumonia    PONV (postoperative nausea and vomiting)    Pre-diabetes    per pt; no meds   Pulmonary hypertension (HCC) 12/23/2018   Raynaud phenomenon    Right heart failure (  HCC) 04/17/2017   Seizures (HCC)    last febrile seizure was approx 3 weeks ago per report on 12/01/2020   Supplemental oxygen  dependent    3 liters   SVT (supraventricular tachycardia) (HCC)    Tachycardia    Thyroid  cancer (HCC)    STATUS POST SURGICAL REMOVAL-CURRENT ON THYROID  REPLACEMENT    Past Surgical History:  Procedure Laterality Date   ABDOMINAL HYSTERECTOMY     AMPUTATION Left 01/30/2019   Procedure: Left Index finger amputation with flap reconstruction and repair reconstruction;  Surgeon: Camella Fallow, MD;  Location: [facility] OR;  Service: Orthopedics;   Laterality: Left;   APPENDECTOMY     BIOPSY OF SKIN SUBCUTANEOUS TISSUE AND/OR MUCOUS MEMBRANE  12/26/2023   Procedure: BIOPSY, SKIN, SUBCUTANEOUS TISSUE, OR MUCOUS MEMBRANE;  Surgeon: Leigh Elspeth SQUIBB, MD;  Location: [facility];  Service: Gastroenterology;;   breast implants and removal      breast implants but leaking      CARDIAC CATHETERIZATION  05/18/2009   NORMAL CATH   CESAREAN SECTION  2007   and 1997   COLONOSCOPY     ESOPHAGEAL DILATION  02/11/2024   Procedure: DILATION, ESOPHAGUS;  Surgeon: San Sandor GAILS, DO;  Location: [facility];  Service: Gastroenterology;;   ESOPHAGOGASTRODUODENOSCOPY N/A 12/26/2023   Procedure: EGD (ESOPHAGOGASTRODUODENOSCOPY);  Surgeon: Leigh Elspeth SQUIBB, MD;  Location: [facility];  Service: Gastroenterology;  Laterality: N/A;   ESOPHAGOGASTRODUODENOSCOPY N/A 02/11/2024   Procedure: EGD (ESOPHAGOGASTRODUODENOSCOPY);  Surgeon: San Sandor GAILS, DO;  Location: [facility];  Service: Gastroenterology;  Laterality: N/A;   ESOPHAGOGASTRODUODENOSCOPY N/A 04/13/2024   Procedure: EGD (ESOPHAGOGASTRODUODENOSCOPY);  Surgeon: San Sandor GAILS, DO;  Location: [facility];  Service: Gastroenterology;  Laterality: N/A;   HOT HEMOSTASIS N/A 12/26/2023   Procedure: EGD, WITH ARGON PLASMA COAGULATION;  Surgeon: Leigh Elspeth SQUIBB, MD;  Location: [facility];  Service: Gastroenterology;  Laterality: N/A;   hx of chemotherapy      hx of radiation therapy      I & D EXTREMITY Left 12/23/2018   Procedure: IRRIGATION AND DEBRIDEMENT HAND / INDEX FINGER;  Surgeon: Camella Fallow, MD;  Location: [facility] OR;  Service: Orthopedics;  Laterality: Left;   IR CV LINE INJECTION  03/22/2022   IR FLUORO GUIDE CV LINE RIGHT  10/09/2022   IR FLUORO GUIDE CV LINE RIGHT  01/18/2023   IR FLUORO GUIDE CV LINE RIGHT  03/26/2023   IR FLUORO GUIDE CV LINE RIGHT  10/10/2023   IR FLUORO GUIDE CV LINE RIGHT  01/06/2024   IR IMAGING GUIDED PORT INSERTION  05/06/2020   IR IMAGING  GUIDED PORT INSERTION  12/04/2021   IR IMAGING GUIDED PORT INSERTION  02/25/2024   IR PATIENT EVAL TECH 0-60 MINS  03/12/2024   IR RADIOLOGIST EVAL & MGMT  03/12/2024   IR RADIOLOGIST EVAL & MGMT  03/31/2024   IR REMOVAL TUN ACCESS W/ PORT W/O FL MOD SED  04/27/2020   IR REMOVAL TUN ACCESS W/ PORT W/O FL MOD SED  12/04/2021   IR REMOVAL TUN ACCESS W/ PORT W/O FL MOD SED  03/30/2022   IR REMOVAL TUN ACCESS W/ PORT W/O FL MOD SED  02/27/2024   IR REMOVAL TUN CV CATH W/O FL  02/25/2024   IR US  GUIDE VASC ACCESS RIGHT  10/09/2022   KIDNEY STONE SURGERY     LUMBAR PUNCTURE W/ INTRATHECAL CHEMOTHERAPY     MASTECTOMY     PITUITARY SURGERY     RIGHT/LEFT HEART CATH AND CORONARY ANGIOGRAPHY N/A 04/02/2018  Procedure: RIGHT/LEFT HEART CATH AND CORONARY ANGIOGRAPHY;  Surgeon: Verlin Lonni BIRCH, MD;  [facility]: [facility];  Service: Cardiovascular;  Laterality: N/A;   RIGHT/LEFT HEART CATH AND CORONARY ANGIOGRAPHY N/A 08/31/2022   Procedure: RIGHT/LEFT HEART CATH AND CORONARY ANGIOGRAPHY;  Surgeon: Cherrie Toribio SAUNDERS, MD;  Location: [facility];  Service: Cardiovascular;  Laterality: N/A;   TOOTH EXTRACTION N/A 12/05/2020   Procedure: DENTAL RESTORATION/EXTRACTIONS;  Surgeon: Arvil Lonni RAMAN, MD;  Location: [facility];  Service: Oral Surgery;  Laterality: N/A;   TOTAL THYROIDECTOMY     VIDEO BRONCHOSCOPY Bilateral 11/14/2018   Procedure: VIDEO BRONCHOSCOPY WITHOUT FLUORO;  Surgeon: Kassie Acquanetta Bradley, MD;  Location: [facility] ENDOS[facility];  Service: Cardiopulmonary;  Laterality: Bilateral;   VIDEO BRONCHOSCOPY WITH ENDOBRONCHIAL ULTRASOUND N/A 11/19/2018   Procedure: VIDEO BRONCHOSCOPY WITH ENDOBRONCHIAL ULTRASOUND;  Surgeon: Kassie Acquanetta Bradley, MD;  Location: [facility] OR;  Service: Thoracic;  Laterality: N/A;     reports that she has never smoked. She has never used smokeless tobacco. She reports that she does not currently use alcohol. She reports that she does not use drugs.  Allergies   Allergen Reactions   Ferrous Bisglycinate Chelate [Iron] Anaphylaxis and Other (See Comments)    Only IV - only FERRLICET   Mushroom Extract Complex (Obsolete) Anaphylaxis   Na Ferric Gluc Cplx In Sucrose Anaphylaxis   Tape Hives    Other Reaction(s): other   Cymbalta [Duloxetine Hcl] Swelling and Anxiety   Hydromorphone  Other (See Comments)    BP drop and heart rate drops 5.1.20 PT REPORTS THAT SHE TAKES DILAUDID  AT HOME   Morphine Other (See Comments)    I clawed out my IV(s)   Ondansetron  Hcl Itching and Other (See Comments)    Clawed off my skin   Promethazine Other (See Comments)    Made me to clinch up and my arms shook   Succinylcholine Other (See Comments)    Lock Jaw   Buprenorphine Hcl Hives   Compazine Other (See Comments)    Altered mental status Aggression   Duloxetine    Metoclopramide Other (See Comments)    Dystonia   Morphine And Codeine Hives   Ondansetron  Hives and Rash    Other reaction(s): Unknown Other reaction(s): Unknown   Promethazine Hcl Hives   Silver  Other (See Comments) and Rash    Old formulation only, is able to tolerate new formulation  Other Reaction(s): other    Family History  Adopted: Yes  Family history unknown: Yes   *** Prior to Admission medications   Medication Sig Start Date End Date Taking? Authorizing Provider  albuterol  (PROVENTIL ) (2.5 MG/3ML) 0.083% nebulizer solution INHALE 3 ML BY NEBULIZATION EVERY 6 HOURS AS NEEDED FOR WHEEZING OR SHORTNESS OF BREATH 12/13/23   Kassie Acquanetta Bradley, MD  albuterol  (VENTOLIN  HFA) 108 [phone] Base) MCG/ACT inhaler Inhale 2 puffs into the lungs every 6 (six) hours as needed for wheezing or shortness of breath. 01/08/24   Kassie Acquanetta Bradley, MD  aspirin  EC 81 MG tablet Take 1 tablet (81 mg total) by mouth daily. Swallow whole. Patient taking differently: Take 81 mg by mouth daily. Chewable 07/16/22   Nahser, Aleene PARAS, MD  Bismuth Tribromoph-Petrolatum (XEROFORM OCCLUSIVE GAUZE STRIP) PADS  Apply 1 each topically as directed. 09/17/22   Tobie Yetta HERO, MD  bumetanide  (BUMEX ) 1 MG tablet Take 1 tablet (1 mg total) by mouth 2 (two) times daily. 03/11/24   Nahser, Aleene PARAS, MD  buPROPion  (WELLBUTRIN  XL) 150 MG 24 hr  tablet Take 1 tablet (150 mg total) by mouth daily. 05/25/24   Mozingo, Regina Nattalie, NP  clonazePAM  (KLONOPIN ) 1 MG tablet TAKE 1 TABLET BY MOUTH 3 TIMES A DAY AS NEEDED AND 1 EXTRA FOR SEVERE ANXIETY UP TO 10 A MONTH. 05/25/24   Mozingo, Regina Nattalie, NP  cyclobenzaprine  (FLEXERIL ) 10 MG tablet Take 1 tablet (10 mg total) by mouth 2 (two) times daily as needed for muscle spasms. 05/29/24   Leath-Warren, Etta PARAS, NP  dexlansoprazole  (DEXILANT ) 60 MG capsule Take 1 capsule (60 mg total) by mouth daily. 04/13/24   Cirigliano, Vito V, DO  diclofenac  (VOLTAREN ) 50 MG EC tablet Take 50 mg by mouth 2 (two) times daily as needed (can repeat if needed). 05/29/24   [provider]  diphenhydrAMINE  (BENADRYL ) 50 MG/ML injection Inject 25 mg into the vein 5 (five) times daily as needed (for nausea  (is 50 mg/ml)).    [provider]  EPINEPHRINE  0.3 mg/0.3 mL IJ SOAJ injection INJECT 0.3 MG INTO THE MUSCLE AS NEEDED FOR ANAPHYLAXIS. 04/25/23   Kassie Acquanetta Bradley, MD  famotidine  (PEPCID ) 40 MG/5ML suspension Take 2.5 mLs (20 mg total) by mouth daily. Patient taking differently: Take 20 mg by mouth daily as needed for heartburn or indigestion. 04/13/24 06/03/24  Cirigliano, Vito V, DO  FLORASTOR 250 MG capsule Take 250 mg by mouth 2 (two) times daily.    [provider]  FLUoxetine  (PROZAC ) 40 MG capsule Take 1 capsule (40 mg total) by mouth daily. 05/25/24   Mozingo, Regina Nattalie, NP  fluticasone  furoate-vilanterol (BREO ELLIPTA ) 100-25 MCG/ACT AEPB Inhale 2 puffs into the lungs daily.    [provider]  fluticasone -salmeterol (ADVAIR HFA) 230-21 MCG/ACT inhaler Inhale 2 puffs into the lungs 2 (two) times daily. Patient taking differently: Inhale 2  puffs into the lungs 2 (two) times daily as needed (for flares). 01/08/24   Kassie Acquanetta Bradley, MD  Galcanezumab -gnlm (EMGALITY  [place]) Inject into the skin every 30 (thirty) days.    [provider]  guaiFENesin  (ROBITUSSIN) 100 MG/5ML liquid Take 10 mLs by mouth every 6 (six) hours as needed for cough or to loosen phlegm. 05/22/24   Von Bellis, MD  Heparin  Na, Pork, Lock Flsh PF (BD HEPARIN  POSIFLUSH) 100 UNIT/ML SOLN USE 5 MLS IN PORT A CATH ONCE DAILY AFTER MEDICATION ADMINISTRATION AS A HEPLOCK AS DIRECTED. 04/01/24     hydrocortisone  (CORTEF ) 10 MG tablet Take 1-2 tablets (10-20 mg total) by mouth See admin instructions. Take 20 mg in the am and 10mg  in the evening. Take after completing prednisone  Patient taking differently: Take 10-20 mg by mouth See admin instructions. Take 20 mg by mouth in the morning and 10 mg in the afternoon. 09/29/22   Tobie Yetta HERO, MD  hydrOXYzine  (ATARAX ) 10 MG/5ML syrup Take 12.5 mLs (25 mg total) by mouth 3 (three) times daily. 05/22/24   Von Bellis, MD  hyoscyamine  (LEVSIN  SL) 0.125 MG SL tablet Place 1 tablet (0.125 mg total) under the tongue every 6 (six) hours as needed for cramping (esophageal spasm). Up to 1.25 mg daily 05/22/24   Von Bellis, MD  ipratropium-albuterol  (DUONEB) 0.5-2.5 (3) MG/3ML SOLN Take 3 mLs by nebulization every 6 (six) hours as needed (for shortness of breath or wheezing).    [provider]  lamoTRIgine  (LAMICTAL ) 200 MG tablet TAKE 1 TABLET BY MOUTH EVERYDAY AT BEDTIME 05/25/24   Mozingo, Regina Nattalie, NP  lamoTRIgine  (LAMICTAL ) 25 MG tablet Take two tablets every morning. 05/25/24  Mozingo, Regina Nattalie, NP  lidocaine  (LIDODERM ) 5 % Place 1 patch onto the skin daily. Remove & Discard patch within 12 hours or as directed by MD 05/29/24   Leath-Warren, Etta PARAS, NP  lidocaine  (XYLOCAINE ) 2 % solution Use as directed 10 mLs in the mouth or throat every 4 (four) hours as needed (for esophageal pain and pain with  swallowing.). 12/22/23   Harris, Abigail, PA-C  nitroGLYCERIN  (NITROSTAT ) 0.4 MG SL tablet DISSOLVE 1 TAB UNDER THE TONGUE EVERY 5 MINUTES AS NEEDED FOR CHEST PAIN. MAX OF 3 DOSES, THEN 911. 07/03/23   Nahser, Aleene PARAS, MD  NURTEC 75 MG TBDP Take 75 mg by mouth daily as needed (migraines). 09/03/23   [provider]  OLANZapine  (ZYPREXA ) 5 MG tablet Take 1 tablet (5 mg total) by mouth at bedtime. 05/25/24   Mozingo, Regina Nattalie, NP  OXYGEN  Inhale 3-5 L/min into the lungs continuous.    [provider]  potassium chloride  (KLOR-CON ) 20 MEQ packet Take 20 mEq by mouth daily. Can be mixed in the juice for better test 05/22/24 05/22/25  Von Bellis, MD  QVAR  REDIHALER 80 MCG/ACT inhaler Inhale 2 puffs into the lungs daily. 05/10/23   [provider]  rosuvastatin  (CRESTOR ) 10 MG tablet Take 10 mg by mouth in the morning. Mid morning 02/28/18   [provider]  sacubitril -valsartan  (ENTRESTO ) 24-26 MG Take 1 tablet by mouth 2 (two) times daily.    [provider]  sodium chloride  flush (BD POSIFLUSH) 0.9 % SOLN injection Use as directed for catheter maintenance 02/07/24     Sodium Chloride  Flush (NORMAL SALINE FLUSH) 0.9 % SOLN Use as directed for catheter maintenance. 02/07/24     Sodium Chloride  Flush (NORMAL SALINE FLUSH) 0.9 % SOLN Use 10mls as directed for catheter maintenance 05/29/24     sucralfate  (CARAFATE ) 1 GM/10ML suspension Take 1 g by mouth daily as needed (as directed for ulcers).    [provider]  SYNTHROID  100 MCG tablet Take 1 tablet (100 mcg total) by mouth every morning. Patient taking differently: Take 100 mcg by mouth every morning.  Must be name brand* 10/19/21   Gudena, Vinay, MD  Tiotropium Bromide  Monohydrate (SPIRIVA  RESPIMAT) 1.25 MCG/ACT AERS Inhale 2 puffs into the lungs daily. 01/08/24   Kassie Acquanetta Bradley, MD  topiramate  (TOPAMAX ) 50 MG tablet Take 150 mg by mouth at bedtime. 12/25/19   [provider]  zolpidem  (AMBIEN   CR) 12.5 MG CR tablet TAKE ONE TABLET BY MOUTH AT BEDTIME AS NEEDED FOR SLEEP. Patient taking differently: Take 12.5 mg by mouth at bedtime. 05/14/23       Physical Exam: Vitals:   06/21/24 1700 06/21/24 1955 06/21/24 2036 06/21/24 2103  BP:  (!) 130/94 132/85   Pulse: 92 81 82   Resp: 11 13 14    Temp:   97.6 F (36.4 C)   TempSrc:   Oral   SpO2: 100% 100% 100%   Weight:  75.2 kg  75.2 kg  Height:    5' 4 (1.626 m)    Constitutional: NAD, calm, comfortable Vitals:   06/21/24 1700 06/21/24 1955 06/21/24 2036 06/21/24 2103  BP:  (!) 130/94 132/85   Pulse: 92 81 82   Resp: 11 13 14    Temp:   97.6 F (36.4 C)   TempSrc:   Oral   SpO2: 100% 100% 100%   Weight:  75.2 kg  75.2 kg  Height:    5' 4 (1.626 m)   Eyes:  PERRL, lids and conjunctivae normal ENMT: Mucous membranes are moist. Posterior pharynx clear of any exudate or lesions.Normal dentition.  Neck: normal, supple, no masses, no thyromegaly Respiratory: clear to auscultation bilaterally, no wheezing, no crackles. Normal respiratory effort. No accessory muscle use.  Cardiovascular: Regular rate and rhythm, no murmurs / rubs / gallops. No extremity edema. 2+ pedal pulses. No carotid bruits.  Abdomen: no tenderness, no masses palpated. No hepatosplenomegaly. Bowel sounds positive.  Musculoskeletal: no clubbing / cyanosis. No joint deformity upper and lower extremities. Good ROM, no contractures. Normal muscle tone.  Skin: no rashes, lesions, ulcers. No induration Neurologic: CN 2-12 grossly intact. Sensation intact, DTR normal. Strength 5/5 in all 4.  Psychiatric: Normal judgment and insight. Alert and oriented x 3. Normal mood.    Labs on Admission: I have personally reviewed following labs and imaging studies  CBC: Recent Labs  Lab 06/21/24 1701  WBC 7.2  NEUTROABS 3.9  HGB 12.2  HCT 38.5  MCV 76.5*  PLT 271   Basic Metabolic Panel: Recent Labs  Lab 06/21/24 1701  NA 139  K 2.6*  CL 98  CO2 23  GLUCOSE  129*  BUN 27*  CREATININE 1.93*  CALCIUM  9.6  MG 1.9   GFR: Estimated Creatinine Clearance: 33.5 mL/min (A) (by C-G formula based on SCr of 1.93 mg/dL (H)). Liver Function Tests: Recent Labs  Lab 06/21/24 1701  AST 79*  ALT 136*  ALKPHOS 99  BILITOT 0.4  PROT 8.5*  ALBUMIN 4.8   Recent Labs  Lab 06/21/24 1701  LIPASE 62*   No results for input(s): AMMONIA in the last 168 hours. Coagulation Profile: No results for input(s): INR, PROTIME in the last 168 hours. Cardiac Enzymes: No results for input(s): CKTOTAL, CKMB, CKMBINDEX, TROPONINI in the last 168 hours. BNP (last 3 results) No results for input(s): PROBNP in the last 8760 hours. HbA1C: No results for input(s): HGBA1C in the last 72 hours. CBG: No results for input(s): GLUCAP in the last 168 hours. Lipid Profile: No results for input(s): CHOL, HDL, LDLCALC, TRIG, CHOLHDL, LDLDIRECT in the last 72 hours. Thyroid  Function Tests: No results for input(s): TSH, T4TOTAL, FREET4, T3FREE, THYROIDAB in the last 72 hours. Anemia Panel: No results for input(s): VITAMINB12, FOLATE, FERRITIN, TIBC, IRON, RETICCTPCT in the last 72 hours. Urine analysis:    Component Value Date/Time   COLORURINE YELLOW 07/18/2023 1240   APPEARANCEUR CLEAR 07/18/2023 1240   LABSPEC 1.019 07/18/2023 1240   PHURINE 5.5 07/18/2023 1240   GLUCOSEU NEGATIVE 07/18/2023 1240   HGBUR NEGATIVE 07/18/2023 1240   BILIRUBINUR NEGATIVE 07/18/2023 1240   KETONESUR NEGATIVE 07/18/2023 1240   PROTEINUR NEGATIVE 07/18/2023 1240   UROBILINOGEN 1.0 06/16/2015 1045   NITRITE NEGATIVE 07/18/2023 1240   LEUKOCYTESUR NEGATIVE 07/18/2023 1240    Radiological Exams on Admission: DG Chest 2 View Result Date: 06/21/2024 CLINICAL DATA:  Nausea and vomiting. EXAM: CHEST - 2 VIEW COMPARISON:  05/18/2024. FINDINGS: The heart size and mediastinal contours are within normal limits. There is atherosclerotic  calcification of the aorta. Focal airspace disease is noted in the retrocardiac region on the left. No effusion or pneumothorax is seen. A left PICC line appear stable. Surgical clips are noted in the chest walls bilaterally. No acute osseous abnormality. IMPRESSION: Airspace disease in the retrocardiac region on the left, possible residual pneumonia. Electronically Signed   By: Leita Birmingham M.D.   On: 06/21/2024 18:36   CT ABDOMEN PELVIS WO CONTRAST Result Date: 06/21/2024 CLINICAL DATA:  Left  flank pain with vomiting. EXAM: CT ABDOMEN AND PELVIS WITHOUT CONTRAST TECHNIQUE: Multidetector CT imaging of the abdomen and pelvis was performed following the standard protocol without IV contrast. RADIATION DOSE REDUCTION: This exam was performed according to the departmental dose-optimization program which includes automated exposure control, adjustment of the mA and/or kV according to patient size and/or use of iterative reconstruction technique. COMPARISON:  01/23/2023. FINDINGS: Lower chest: Atelectasis is noted at the lung bases. Hepatobiliary: Stable scattered subcentimeter hypodensities are noted in the liver, statistically most likely representing cysts or hemangiomas. No biliary ductal dilatation. The gallbladder is without stones. Pancreas: Unremarkable. No pancreatic ductal dilatation or surrounding inflammatory changes. Spleen: Normal in size without focal abnormality. Adrenals/Urinary Tract: The right adrenal gland is within normal limits. There is a 9 mm nodule in the left adrenal gland. No additional imaging is required at this time. There are subcentimeter hypodensities in the kidneys, possible cysts. An indeterminate exophytic lesion is noted in the left kidney measuring 1.3 cm. Renal calculi are present bilaterally. No obstructive uropathy is seen. The bladder is unremarkable. Stomach/Bowel: The stomach is within normal limits. No bowel obstruction, free air, or pneumatosis is seen. A moderate amount  of retained stool is present in the colon. Appendix is surgically absent. Vascular/Lymphatic: Aortic atherosclerosis. No enlarged abdominal or pelvic lymph nodes. Reproductive: Status post hysterectomy. No adnexal masses. Other: No abdominopelvic ascites. A fat containing umbilical hernia is noted. There are bilateral breast implants. Musculoskeletal: Degenerative changes are present in the thoracolumbar spine. No acute osseous abnormality. IMPRESSION: 1. No acute intra-abdominal process. 2. Bilateral nephrolithiasis without evidence of obstruction. 3. Complex lesion in in the lower pole of the left kidney with indeterminate imaging characteristics. Ultrasound or MRI is recommended for further characterization on follow-up. 4. Moderate amount of retained stool in the colon suggesting constipation. 5. Aortic atherosclerosis. Electronically Signed   By: Leita Birmingham M.D.   On: 06/21/2024 18:33    EKG: Independently reviewed. ***  Assessment/Plan Principal Problem:   AKI (acute kidney injury) (HCC)      Adrenal Crisis  - with acute exacerbation of chronic nausea  - however to be complete will r/o infection , cxr negative, ctab no acute finding willf/u with UA  -replete electrolytes check mag /phos  Hypokalemia -replete prn  -check mag/phos level   AKI  -cr now 1.93 baseline 0.89  - hold entresto  and bumex   -check lactic acid -gentle ivfs   Elevated LFTs  -possible in setting of dehydration  -of note CT abd  acute findings in biliary tree /liver   Hx of Anemia -last admit s/p 2 unit of PRBC -presumed IDA, c/b by patient allergy to iron supplement -hgb stable at 12.2  Chfpef  -grade I diastolic heart failure  - hold bumex /entresto  in setting of AKI   Hypothyroidism  -resume home synthroid    Hx of Pulmonary Hypertension -no active issues   GERD - ppi   NHL  -  Depression/anxiety    DVT prophylaxis: heparin  Code Status: full/ as discussed per patient wishes in event  of cardiac arrest  Family Communication: none at bedside Disposition Plan: patient  expected to be admitted greater than 2 midnights  Consults called:  n/a Admission status: med tele   Camila DELENA Ned MD [facility]   If 7PM-7AM, please contact night-coverage [email or web address] Password [email or web address]  06/21/2024, 9:05 PM

## 2024-06-21 NOTE — ED Triage Notes (Signed)
 States unable to keep anything down x 2 weeks. States symptoms started when d/c from hospital with pna/spesis/addison crisis.

## 2024-06-21 NOTE — Progress Notes (Deleted)
 Reason for Infectious Disease Consult: recurrent candidal esophagitis  Requesting Physician: Sandor Flatter, MD  Subjective:    Patient ID: [name], female    DOB: 1971-09-23, 53 y.o.   MRN: [id number]  HPI  Past Medical History:  Diagnosis Date   Addison's disease [facility])    Adrenal insufficiency (HCC)    Anemia    Anxiety    Aortic stenosis    Aortic stenosis    Appendicitis 12/19/2009   Appendicitis    Breast cancer (HCC)    STATUS POST BILATERAL MASTECTOMY. STATUS POST RECONSTRUCTION. SHE HAD SILICONE BREAST IMPLANTS AND THE LEFT IMPLANT IS LEAKING SLIGHTLY   Cellulitis of right middle finger 11/07/2018   Cervical cancer (HCC) 12/23/2018   Chest pain    CHF with right heart failure (HCC) 04/17/2017   Chronic respiratory failure with hypoxia (HCC) 12/23/2018   CKD (chronic kidney disease) stage 3, GFR 30-59 ml/min (HCC)    Cough variant asthma 04/13/2019   Depression    Functional neurological symptom disorder with mixed symptoms 2023   GERD (gastroesophageal reflux disease)    takes Dexilant  and carafate  and gi coctail    Headache    migraines on a daily and monthly regimen    Heart murmur    History of kidney stones    Hodgkin lymphoma (HCC)    STATUS POST MANTLE RADIATION   Hodgkin's lymphoma (HCC)    1987   Hypertension    Hypoxia    Multiple lung nodules    bilateral   Necrotizing fasciitis (HCC) 12/23/2018   Non-ischemic cardiomyopathy (HCC)    Osteoporosis    Ovarian tumor (benign)    bilateral   Palpitations    Pituitary adenoma (HCC) 12/23/2018   Pneumonia    PONV (postoperative nausea and vomiting)    Pre-diabetes    per pt; no meds   Pulmonary hypertension (HCC) 12/23/2018   Raynaud phenomenon    Right heart failure (HCC) 04/17/2017   Seizures (HCC)    last febrile seizure was approx 3 weeks ago per report on 12/01/2020   Supplemental oxygen  dependent    3 liters   SVT (supraventricular tachycardia) (HCC)    Tachycardia    Thyroid   cancer (HCC)    STATUS POST SURGICAL REMOVAL-CURRENT ON THYROID  REPLACEMENT    Past Surgical History:  Procedure Laterality Date   ABDOMINAL HYSTERECTOMY     AMPUTATION Left 01/30/2019   Procedure: Left Index finger amputation with flap reconstruction and repair reconstruction;  Surgeon: Camella Fallow, MD;  Location: [facility] OR;  Service: Orthopedics;  Laterality: Left;   APPENDECTOMY     BIOPSY OF SKIN SUBCUTANEOUS TISSUE AND/OR MUCOUS MEMBRANE  12/26/2023   Procedure: BIOPSY, SKIN, SUBCUTANEOUS TISSUE, OR MUCOUS MEMBRANE;  Surgeon: Leigh Elspeth SQUIBB, MD;  Location: [facility];  Service: Gastroenterology;;   breast implants and removal      breast implants but leaking      CARDIAC CATHETERIZATION  05/18/2009   NORMAL CATH   CESAREAN SECTION  2007   and 1997   COLONOSCOPY     ESOPHAGEAL DILATION  02/11/2024   Procedure: DILATION, ESOPHAGUS;  Surgeon: Flatter Sandor GAILS, DO;  Location: [facility];  Service: Gastroenterology;;   ESOPHAGOGASTRODUODENOSCOPY N/A 12/26/2023   Procedure: EGD (ESOPHAGOGASTRODUODENOSCOPY);  Surgeon: Leigh Elspeth SQUIBB, MD;  Location: [facility];  Service: Gastroenterology;  Laterality: N/A;   ESOPHAGOGASTRODUODENOSCOPY N/A 02/11/2024   Procedure: EGD (ESOPHAGOGASTRODUODENOSCOPY);  Surgeon: Flatter Sandor GAILS, DO;  Location: [facility];  Service: Gastroenterology;  Laterality: N/A;  ESOPHAGOGASTRODUODENOSCOPY N/A 04/13/2024   Procedure: EGD (ESOPHAGOGASTRODUODENOSCOPY);  Surgeon: San Sandor GAILS, DO;  Location: [facility];  Service: Gastroenterology;  Laterality: N/A;   HOT HEMOSTASIS N/A 12/26/2023   Procedure: EGD, WITH ARGON PLASMA COAGULATION;  Surgeon: Leigh Elspeth SQUIBB, MD;  Location: [facility];  Service: Gastroenterology;  Laterality: N/A;   hx of chemotherapy      hx of radiation therapy      I & D EXTREMITY Left 12/23/2018   Procedure: IRRIGATION AND DEBRIDEMENT HAND / INDEX FINGER;  Surgeon: Camella Fallow, MD;  Location: [facility] OR;   Service: Orthopedics;  Laterality: Left;   IR CV LINE INJECTION  03/22/2022   IR FLUORO GUIDE CV LINE RIGHT  10/09/2022   IR FLUORO GUIDE CV LINE RIGHT  01/18/2023   IR FLUORO GUIDE CV LINE RIGHT  03/26/2023   IR FLUORO GUIDE CV LINE RIGHT  10/10/2023   IR FLUORO GUIDE CV LINE RIGHT  01/06/2024   IR IMAGING GUIDED PORT INSERTION  05/06/2020   IR IMAGING GUIDED PORT INSERTION  12/04/2021   IR IMAGING GUIDED PORT INSERTION  02/25/2024   IR PATIENT EVAL TECH 0-60 MINS  03/12/2024   IR RADIOLOGIST EVAL & MGMT  03/12/2024   IR RADIOLOGIST EVAL & MGMT  03/31/2024   IR REMOVAL TUN ACCESS W/ PORT W/O FL MOD SED  04/27/2020   IR REMOVAL TUN ACCESS W/ PORT W/O FL MOD SED  12/04/2021   IR REMOVAL TUN ACCESS W/ PORT W/O FL MOD SED  03/30/2022   IR REMOVAL TUN ACCESS W/ PORT W/O FL MOD SED  02/27/2024   IR REMOVAL TUN CV CATH W/O FL  02/25/2024   IR US  GUIDE VASC ACCESS RIGHT  10/09/2022   KIDNEY STONE SURGERY     LUMBAR PUNCTURE W/ INTRATHECAL CHEMOTHERAPY     MASTECTOMY     PITUITARY SURGERY     RIGHT/LEFT HEART CATH AND CORONARY ANGIOGRAPHY N/A 04/02/2018   Procedure: RIGHT/LEFT HEART CATH AND CORONARY ANGIOGRAPHY;  Surgeon: Verlin Lonni BIRCH, MD;  Location: [facility];  Service: Cardiovascular;  Laterality: N/A;   RIGHT/LEFT HEART CATH AND CORONARY ANGIOGRAPHY N/A 08/31/2022   Procedure: RIGHT/LEFT HEART CATH AND CORONARY ANGIOGRAPHY;  Surgeon: Cherrie Toribio SAUNDERS, MD;  Location: [facility];  Service: Cardiovascular;  Laterality: N/A;   TOOTH EXTRACTION N/A 12/05/2020   Procedure: DENTAL RESTORATION/EXTRACTIONS;  Surgeon: Arvil Lonni RAMAN, MD;  Location: [facility];  Service: Oral Surgery;  Laterality: N/A;   TOTAL THYROIDECTOMY     VIDEO BRONCHOSCOPY Bilateral 11/14/2018   Procedure: VIDEO BRONCHOSCOPY WITHOUT FLUORO;  Surgeon: Kassie Acquanetta Bradley, MD;  Location: [facility] ENDOSCOPY;  Service: Cardiopulmonary;  Laterality: Bilateral;   VIDEO BRONCHOSCOPY WITH ENDOBRONCHIAL ULTRASOUND  N/A 11/19/2018   Procedure: VIDEO BRONCHOSCOPY WITH ENDOBRONCHIAL ULTRASOUND;  Surgeon: Kassie Acquanetta Bradley, MD;  Location: [facility] OR;  Service: Thoracic;  Laterality: N/A;    Family History  Adopted: Yes  Family history unknown: Yes      Social History   Socioeconomic History   Marital status: Married    Spouse name: Not on file   Number of children: Not on file   Years of education: Not on file   Highest education level: Not on file  Occupational History   Not on file  Tobacco Use   Smoking status: Never   Smokeless tobacco: Never  Vaping Use   Vaping status: Never Used  Substance and Sexual Activity   Alcohol use: Not Currently    Comment: social  Drug use: No   Sexual activity: Not Currently    Birth control/protection: Surgical  Other Topics Concern   Not on file  Social History Narrative   Not on file   Social Drivers of Health   Financial Resource Strain: Medium Risk (08/21/2017)   Received from [facility]   Overall Financial Resource Strain (CARDIA)    Difficulty of Paying Living Expenses: Somewhat hard  Food Insecurity: No Food Insecurity (06/21/2024)   Hunger Vital Sign    Worried About Running Out of Food in the Last Year: Never true    Ran Out of Food in the Last Year: Never true  Transportation Needs: No Transportation Needs (06/21/2024)   PRAPARE - Administrator, Civil Service (Medical): No    Lack of Transportation (Non-Medical): No  Physical Activity: Unknown (08/21/2017)   Received from [facility]   Exercise Vital Sign    Days of Exercise per Week: 0 days    Minutes of Exercise per Session: Not on file  Stress: Stress Concern Present (08/21/2017)   Received from [facility] - Occupational Stress Questionnaire    Feeling of Stress : Very much  Social Connections: Unknown (02/12/2022)   Received from [facility]   Social Network     Social Network: Not on file    Allergies  Allergen Reactions   Ferrous Bisglycinate Chelate [Iron] Anaphylaxis and Other (See Comments)    Only IV - only FERRLICET   Mushroom Extract Complex (Obsolete) Anaphylaxis   Na Ferric Gluc Cplx In Sucrose Anaphylaxis   Tape Hives    Other Reaction(s): other   Cymbalta [Duloxetine Hcl] Swelling and Anxiety   Hydromorphone  Other (See Comments)    BP drop and heart rate drops 5.1.20 PT REPORTS THAT SHE TAKES DILAUDID  AT HOME   Morphine Other (See Comments)    I clawed out my IV(s)   Ondansetron  Hcl Itching and Other (See Comments)    Clawed off my skin   Promethazine Other (See Comments)    Made me to clinch up and my arms shook   Succinylcholine Other (See Comments)    Lock Jaw   Buprenorphine Hcl Hives   Compazine Other (See Comments)    Altered mental status Aggression   Duloxetine    Metoclopramide Other (See Comments)    Dystonia   Morphine And Codeine Hives   Ondansetron  Hives and Rash    Other reaction(s): Unknown Other reaction(s): Unknown   Promethazine Hcl Hives   Silver  Other (See Comments) and Rash    Old formulation only, is able to tolerate new formulation  Other Reaction(s): other    No current facility-administered medications for this visit. No current outpatient medications on file.  Facility-Administered Medications Ordered in Other Visits:    potassium chloride  10 mEq in 100 mL IVPB, 10 mEq, Intravenous, Q1 Hr x 4, Geiple, Joshua, PA-C, Last Rate: 100 mL/hr at 06/21/24 2110, 10 mEq at 06/21/24 2110   Review of Systems     Objective:   Physical Exam        Assessment & Plan:

## 2024-06-21 NOTE — Plan of Care (Signed)
   Problem: Education: Goal: Knowledge of General Education information will improve Description Including pain rating scale, medication(s)/side effects and non-pharmacologic comfort measures Outcome: Progressing   Problem: Health Behavior/Discharge Planning: Goal: Ability to manage health-related needs will improve Outcome: Progressing

## 2024-06-21 NOTE — Progress Notes (Signed)
 Plan of Care Note for accepted transfer   Patient: [name] MRN: [id number]   DOA: 06/21/2024  Facility requesting transfer: [facility]   Requesting Provider: Sidra Ruby, PA   Reason for transfer: AKI, intractable N/V   Facility course: 53 yr old female with hx of Hodgkin lymphoma, Addison's disease, hypothyroidism, HFpEF, and admission last month for pneumonia who presents with N/V and inability to tolerate her medications.   She is afebrile with stable vitals and found to have potassium 2.6 and SCr 1.93 (baseline <1).  Labs also notable for AST 79 and ALT 136 with normal bilirubin. CT abdomen/pelvis reveals left renal lesion and constipation.   She was treated with 2 liters NS, 100 mg IV hydrocortisone , and IV potassium.   Plan of care: The patient is accepted for admission to Telemetry unit, at [facility].   Author: Evalene GORMAN Sprinkles, MD 06/21/2024  Check [email or web address] for on-call coverage.  Nursing staff, Please call [facility] Admits [facility] Consults System-Wide number on Amion as soon as patient's arrival, so appropriate admitting provider can evaluate the pt.

## 2024-06-21 NOTE — ED Notes (Signed)
CL called for transport 

## 2024-06-21 NOTE — ED Notes (Signed)
 Attempted to call report x 1, left on hold, had to tend to other pts

## 2024-06-21 NOTE — ED Provider Notes (Signed)
 [place] EMERGENCY DEPARTMENT AT [facility] Provider Note   CSN: [id number] Arrival date & time: 06/21/24  1629     Patient presents with: Emesis   [name] is a 53 y.o. female.   Patient with history of Hodgkin's lymphoma, pulmonary artery hypertension, CHF, Addison's disease, migraines, chronic hypoxic respiratory failure on home oxygen , hospitalization for hypoxic respiratory failure due to pneumonia 8/18 - 05/22/2024 --presents to the emergency department for evaluation of nausea and vomiting.  Patient reports chronic nausea, controlled at home typically with Benadryl  due to intolerances.  She has had more frequent episodes of vomiting over the past 2 weeks and has been very persistent over the past 4 days.  She has not been able to tolerate her medications, including her home steroids that she takes for Addison's disease.  She does have some new left-sided abdominal pain and flank pain.  No persistent fevers or URI symptoms.  She is stable on her home supplemental oxygen  dose and has not needed to increase.  She denies cough or shortness of breath.  She is urinating less, approximately 1 time per day over the past several days.  No diarrhea or constipation.  No blood in the stool.       Prior to Admission medications   Medication Sig Start Date End Date Taking? Authorizing Provider  albuterol  (PROVENTIL ) (2.5 MG/3ML) 0.083% nebulizer solution INHALE 3 ML BY NEBULIZATION EVERY 6 HOURS AS NEEDED FOR WHEEZING OR SHORTNESS OF BREATH 12/13/23   Kassie Acquanetta Bradley, MD  albuterol  (VENTOLIN  HFA) [phone] Base) MCG/ACT inhaler Inhale 2 puffs into the lungs every 6 (six) hours as needed for wheezing or shortness of breath. 01/08/24   Kassie Acquanetta Bradley, MD  aspirin  EC 81 MG tablet Take 1 tablet (81 mg total) by mouth daily. Swallow whole. Patient taking differently: Take 81 mg by mouth daily. Chewable 07/16/22   Nahser, Aleene PARAS, MD  Bismuth Tribromoph-Petrolatum (XEROFORM OCCLUSIVE  GAUZE STRIP) PADS Apply 1 each topically as directed. 09/17/22   Tobie Yetta CHRISTELLA, MD  bumetanide  (BUMEX ) 1 MG tablet Take 1 tablet (1 mg total) by mouth 2 (two) times daily. 03/11/24   Nahser, Aleene PARAS, MD  buPROPion  (WELLBUTRIN  XL) 150 MG 24 hr tablet Take 1 tablet (150 mg total) by mouth daily. 05/25/24   Mozingo, Regina Nattalie, NP  clonazePAM  (KLONOPIN ) 1 MG tablet TAKE 1 TABLET BY MOUTH 3 TIMES A DAY AS NEEDED AND 1 EXTRA FOR SEVERE ANXIETY UP TO 10 A MONTH. 05/25/24   Mozingo, Regina Nattalie, NP  cyclobenzaprine  (FLEXERIL ) 10 MG tablet Take 1 tablet (10 mg total) by mouth 2 (two) times daily as needed for muscle spasms. 05/29/24   Leath-Warren, Etta PARAS, NP  dexlansoprazole  (DEXILANT ) 60 MG capsule Take 1 capsule (60 mg total) by mouth daily. 04/13/24   Cirigliano, Vito V, DO  diclofenac  (VOLTAREN ) 50 MG EC tablet Take 50 mg by mouth 2 (two) times daily as needed (can repeat if needed). 05/29/24   [provider]  diphenhydrAMINE  (BENADRYL ) 50 MG/ML injection Inject 25 mg into the vein 5 (five) times daily as needed (for nausea  (is 50 mg/ml)).    [provider]  EPINEPHRINE  0.3 mg/0.3 mL IJ SOAJ injection INJECT 0.3 MG INTO THE MUSCLE AS NEEDED FOR ANAPHYLAXIS. 04/25/23   Kassie Acquanetta Bradley, MD  famotidine  (PEPCID ) 40 MG/5ML suspension Take 2.5 mLs (20 mg total) by mouth daily. Patient taking differently: Take 20 mg by mouth daily as needed for heartburn or  indigestion. 04/13/24 06/03/24  Cirigliano, Vito V, DO  FLORASTOR 250 MG capsule Take 250 mg by mouth 2 (two) times daily.    [provider]  FLUoxetine  (PROZAC ) 40 MG capsule Take 1 capsule (40 mg total) by mouth daily. 05/25/24   Mozingo, Regina Nattalie, NP  fluticasone  furoate-vilanterol (BREO ELLIPTA ) 100-25 MCG/ACT AEPB Inhale 2 puffs into the lungs daily.    [provider]  fluticasone -salmeterol (ADVAIR HFA) 230-21 MCG/ACT inhaler Inhale 2 puffs into the lungs 2 (two) times daily. Patient taking  differently: Inhale 2 puffs into the lungs 2 (two) times daily as needed (for flares). 01/08/24   Kassie Acquanetta Bradley, MD  Galcanezumab -gnlm (EMGALITY  [place]) Inject into the skin every 30 (thirty) days.    [provider]  guaiFENesin  (ROBITUSSIN) 100 MG/5ML liquid Take 10 mLs by mouth every 6 (six) hours as needed for cough or to loosen phlegm. 05/22/24   Von Bellis, MD  Heparin  Na, Pork, Lock Flsh PF (BD HEPARIN  POSIFLUSH) 100 UNIT/ML SOLN USE 5 MLS IN PORT A CATH ONCE DAILY AFTER MEDICATION ADMINISTRATION AS A HEPLOCK AS DIRECTED. 04/01/24     hydrocortisone  (CORTEF ) 10 MG tablet Take 1-2 tablets (10-20 mg total) by mouth See admin instructions. Take 20 mg in the am and 10mg  in the evening. Take after completing prednisone  Patient taking differently: Take 10-20 mg by mouth See admin instructions. Take 20 mg by mouth in the morning and 10 mg in the afternoon. 09/29/22   Tobie Yetta HERO, MD  hydrOXYzine  (ATARAX ) 10 MG/5ML syrup Take 12.5 mLs (25 mg total) by mouth 3 (three) times daily. 05/22/24   Von Bellis, MD  hyoscyamine  (LEVSIN  SL) 0.125 MG SL tablet Place 1 tablet (0.125 mg total) under the tongue every 6 (six) hours as needed for cramping (esophageal spasm). Up to 1.25 mg daily 05/22/24   Von Bellis, MD  ipratropium-albuterol  (DUONEB) 0.5-2.5 (3) MG/3ML SOLN Take 3 mLs by nebulization every 6 (six) hours as needed (for shortness of breath or wheezing).    [provider]  lamoTRIgine  (LAMICTAL ) 200 MG tablet TAKE 1 TABLET BY MOUTH EVERYDAY AT BEDTIME 05/25/24   Mozingo, Regina Nattalie, NP  lamoTRIgine  (LAMICTAL ) 25 MG tablet Take two tablets every morning. 05/25/24   Mozingo, Regina Nattalie, NP  lidocaine  (LIDODERM ) 5 % Place 1 patch onto the skin daily. Remove & Discard patch within 12 hours or as directed by MD 05/29/24   Leath-Warren, Etta PARAS, NP  lidocaine  (XYLOCAINE ) 2 % solution Use as directed 10 mLs in the mouth or throat every 4 (four) hours as needed (for esophageal  pain and pain with swallowing.). 12/22/23   Harris, Abigail, PA-C  nitroGLYCERIN  (NITROSTAT ) 0.4 MG SL tablet DISSOLVE 1 TAB UNDER THE TONGUE EVERY 5 MINUTES AS NEEDED FOR CHEST PAIN. MAX OF 3 DOSES, THEN 911. 07/03/23   Nahser, Aleene PARAS, MD  NURTEC 75 MG TBDP Take 75 mg by mouth daily as needed (migraines). 09/03/23   [provider]  OLANZapine  (ZYPREXA ) 5 MG tablet Take 1 tablet (5 mg total) by mouth at bedtime. 05/25/24   Mozingo, Regina Nattalie, NP  OXYGEN  Inhale 3-5 L/min into the lungs continuous.    [provider]  potassium chloride  (KLOR-CON ) 20 MEQ packet Take 20 mEq by mouth daily. Can be mixed in the juice for better test 05/22/24 05/22/25  Von Bellis, MD  QVAR  REDIHALER 80 MCG/ACT inhaler Inhale 2 puffs into the lungs daily. 05/10/23   [provider]  rosuvastatin  (CRESTOR ) 10 MG  tablet Take 10 mg by mouth in the morning. Mid morning 02/28/18   [provider]  sacubitril -valsartan  (ENTRESTO ) 24-26 MG Take 1 tablet by mouth 2 (two) times daily.    [provider]  sodium chloride  flush (BD POSIFLUSH) 0.9 % SOLN injection Use as directed for catheter maintenance 02/07/24     Sodium Chloride  Flush (NORMAL SALINE FLUSH) 0.9 % SOLN Use as directed for catheter maintenance. 02/07/24     Sodium Chloride  Flush (NORMAL SALINE FLUSH) 0.9 % SOLN Use 10mls as directed for catheter maintenance 05/29/24     sucralfate  (CARAFATE ) 1 GM/10ML suspension Take 1 g by mouth daily as needed (as directed for ulcers).    [provider]  SYNTHROID  100 MCG tablet Take 1 tablet (100 mcg total) by mouth every morning. Patient taking differently: Take 100 mcg by mouth every morning.  Must be name brand* 10/19/21   Gudena, Vinay, MD  Tiotropium Bromide  Monohydrate (SPIRIVA  RESPIMAT) 1.25 MCG/ACT AERS Inhale 2 puffs into the lungs daily. 01/08/24   Kassie Acquanetta Bradley, MD  topiramate  (TOPAMAX ) 50 MG tablet Take 150 mg by mouth at bedtime. 12/25/19   [provider]   zolpidem  (AMBIEN  CR) 12.5 MG CR tablet TAKE ONE TABLET BY MOUTH AT BEDTIME AS NEEDED FOR SLEEP. Patient taking differently: Take 12.5 mg by mouth at bedtime. 05/14/23       Allergies: Ferrous bisglycinate chelate [iron], Mushroom extract complex (obsolete), Na ferric gluc cplx in sucrose, Tape, Cymbalta [duloxetine hcl], Hydromorphone , Morphine, Ondansetron  hcl, Promethazine, Succinylcholine, Buprenorphine hcl, Compazine, Duloxetine, Metoclopramide, Morphine and codeine, Ondansetron , Promethazine hcl, and Silver     Review of Systems  Updated Vital Signs BP 135/85   Pulse 92   Temp 98.9 F (37.2 C) (Oral)   Resp 11   LMP  (LMP Unknown)   SpO2 100%   Physical Exam Vitals and nursing note reviewed.  Constitutional:      General: She is not in acute distress.    Appearance: She is well-developed.  HENT:     Head: Normocephalic and atraumatic.     Right Ear: External ear normal.     Left Ear: External ear normal.     Nose: Nose normal.     Mouth/Throat:     Mouth: Mucous membranes are dry.  Eyes:     Conjunctiva/sclera: Conjunctivae normal.  Cardiovascular:     Rate and Rhythm: Normal rate and regular rhythm.     Heart sounds: No murmur heard. Pulmonary:     Effort: No respiratory distress.     Breath sounds: No wheezing, rhonchi or rales.  Abdominal:     Palpations: Abdomen is soft.     Tenderness: There is abdominal tenderness in the left upper quadrant and left lower quadrant. There is no guarding or rebound.  Musculoskeletal:     Cervical back: Normal range of motion and neck supple.     Right lower leg: No edema.     Left lower leg: No edema.  Skin:    General: Skin is warm and dry.     Findings: No rash.  Neurological:     General: No focal deficit present.     Mental Status: She is alert. Mental status is at baseline.     Motor: No weakness.  Psychiatric:        Mood and Affect: Mood normal.     (all labs ordered are listed, but only abnormal results are  displayed) Labs Reviewed  LIPASE, BLOOD - Abnormal; Notable for  the following components:      Result Value   Lipase 62 (*)    All other components within normal limits  COMPREHENSIVE METABOLIC PANEL WITH GFR - Abnormal; Notable for the following components:   Potassium 2.6 (*)    Glucose, Bld 129 (*)    BUN 27 (*)    Creatinine, Ser 1.93 (*)    Total Protein 8.5 (*)    AST 79 (*)    ALT 136 (*)    GFR, Estimated 30 (*)    Anion gap 18 (*)    All other components within normal limits  CBC WITH DIFFERENTIAL/PLATELET - Abnormal; Notable for the following components:   MCV 76.5 (*)    MCH 24.3 (*)    RDW 23.3 (*)    All other components within normal limits  MAGNESIUM   URINALYSIS, ROUTINE W REFLEX MICROSCOPIC    ED ECG REPORT   Date: 06/21/2024  Rate: 94  Rhythm: normal sinus rhythm  QRS Axis: left  Intervals: QT prolonged  ST/T Wave abnormalities: normal  Conduction Disutrbances:left anterior fascicular block  Narrative Interpretation:   Old EKG Reviewed: unchanged from 05/2024  I have personally reviewed the EKG tracing and agree with the computerized printout as noted.   Radiology: DG Chest 2 View Result Date: 06/21/2024 CLINICAL DATA:  Nausea and vomiting. EXAM: CHEST - 2 VIEW COMPARISON:  05/18/2024. FINDINGS: The heart size and mediastinal contours are within normal limits. There is atherosclerotic calcification of the aorta. Focal airspace disease is noted in the retrocardiac region on the left. No effusion or pneumothorax is seen. A left PICC line appear stable. Surgical clips are noted in the chest walls bilaterally. No acute osseous abnormality. IMPRESSION: Airspace disease in the retrocardiac region on the left, possible residual pneumonia. Electronically Signed   By: Leita Birmingham M.D.   On: 06/21/2024 18:36   CT ABDOMEN PELVIS WO CONTRAST Result Date: 06/21/2024 CLINICAL DATA:  Left flank pain with vomiting. EXAM: CT ABDOMEN AND PELVIS WITHOUT CONTRAST  TECHNIQUE: Multidetector CT imaging of the abdomen and pelvis was performed following the standard protocol without IV contrast. RADIATION DOSE REDUCTION: This exam was performed according to the departmental dose-optimization program which includes automated exposure control, adjustment of the mA and/or kV according to patient size and/or use of iterative reconstruction technique. COMPARISON:  01/23/2023. FINDINGS: Lower chest: Atelectasis is noted at the lung bases. Hepatobiliary: Stable scattered subcentimeter hypodensities are noted in the liver, statistically most likely representing cysts or hemangiomas. No biliary ductal dilatation. The gallbladder is without stones. Pancreas: Unremarkable. No pancreatic ductal dilatation or surrounding inflammatory changes. Spleen: Normal in size without focal abnormality. Adrenals/Urinary Tract: The right adrenal gland is within normal limits. There is a 9 mm nodule in the left adrenal gland. No additional imaging is required at this time. There are subcentimeter hypodensities in the kidneys, possible cysts. An indeterminate exophytic lesion is noted in the left kidney measuring 1.3 cm. Renal calculi are present bilaterally. No obstructive uropathy is seen. The bladder is unremarkable. Stomach/Bowel: The stomach is within normal limits. No bowel obstruction, free air, or pneumatosis is seen. A moderate amount of retained stool is present in the colon. Appendix is surgically absent. Vascular/Lymphatic: Aortic atherosclerosis. No enlarged abdominal or pelvic lymph nodes. Reproductive: Status post hysterectomy. No adnexal masses. Other: No abdominopelvic ascites. A fat containing umbilical hernia is noted. There are bilateral breast implants. Musculoskeletal: Degenerative changes are present in the thoracolumbar spine. No acute osseous abnormality. IMPRESSION: 1. No acute intra-abdominal  process. 2. Bilateral nephrolithiasis without evidence of obstruction. 3. Complex lesion  in in the lower pole of the left kidney with indeterminate imaging characteristics. Ultrasound or MRI is recommended for further characterization on follow-up. 4. Moderate amount of retained stool in the colon suggesting constipation. 5. Aortic atherosclerosis. Electronically Signed   By: Leita Birmingham M.D.   On: 06/21/2024 18:33     Procedures   Medications Ordered in the ED  potassium chloride  10 mEq in 100 mL IVPB (10 mEq Intravenous New Bag/Given 06/21/24 1852)  diphenhydrAMINE  (BENADRYL ) injection 25 mg (has no administration in time range)  sodium chloride  0.9 % bolus 2,000 mL (2,000 mLs Intravenous New Bag/Given 06/21/24 1742)  LORazepam  (ATIVAN ) injection 1 mg (1 mg Intravenous Given 06/21/24 1738)  hydrocortisone  sodium succinate  (SOLU-CORTEF ) 100 MG injection 100 mg (100 mg Intravenous Given 06/21/24 1738)   ED Course  Patient seen and examined. History obtained directly from patient.   Labs/EKG: Ordered CBC, CMP, lipase, UA, added magnesium .  EKG personally reviewed and interpreted as above.  Imaging: Ordered chest x-ray, noncontrast CT of the abdomen pelvis.  Medications/Fluids: Ordered: 2 L normal saline  Most recent vital signs reviewed and are as follows: BP 135/85   Pulse 92   Temp 98.9 F (37.2 C) (Oral)   Resp 11   LMP  (LMP Unknown)   SpO2 100%   Initial impression: Nausea and vomiting, flank pain, dehydration  6:44 PM Reassessment performed. Patient appears stable.  IV fluids are running.  Labs personally reviewed and interpreted including: CBC with normal white blood cell count, hemoglobin is normal at 12.2 however patient with chronic anemia and this likely represents hemoconcentration; CMP with sodium 139, potassium low at 2.6, AKI noted with creatinine up to 1.93 with a BUN of 27, mild elevation in AST and ALT with normal bilirubin, anion gap 18, glucose 129; lipase is minimally elevated at 62; magnesium  is 1.9.  Imaging personally visualized and interpreted  including: CT of the abdomen and pelvis without any infectious or inflammatory insults, moderate stool burden; x-ray reported with possible retrocardiac residual pneumonia, however no signs of pneumonia at least on the visible portions of the retrocardiac area on CT.  Reviewed pertinent lab work and imaging with patient at bedside. Questions answered.   Most current vital signs reviewed and are as follows: BP 135/85   Pulse 92   Temp 98.9 F (37.2 C) (Oral)   Resp 11   LMP  (LMP Unknown)   SpO2 100%   Plan: Aggressive IV hydration, hydrocortisone  already ordered, potassium repletion.  Patient will need admission for AKI due to dehydration, uncontrolled nausea and vomiting.   7:20 PM Discussed with Dr. Charlton who accepts for admission.                                   Medical Decision Making Amount and/or Complexity of Data Reviewed Labs: ordered. Radiology: ordered.  Risk Prescription drug management. Decision regarding hospitalization.   Patient with nausea and vomiting in setting of adrenal insufficiency, unable to keep down her home medications.  She has AKI, hypokalemia.  Reassuring CT overall.  Low clinical concern for recurrent pneumonia, stable on chronic home oxygen , no fevers or cough.     Final diagnoses:  Acute kidney injury (HCC)  Nausea and vomiting, unspecified vomiting type  Hypokalemia  Adrenal insufficiency [facility])    ED Discharge Orders     None  Desiderio Chew, PA-C 06/21/24 AVA    Lenor Hollering, MD 06/21/24 2251

## 2024-06-22 ENCOUNTER — Ambulatory Visit: Admitting: Infectious Disease

## 2024-06-22 DIAGNOSIS — Z8571 Personal history of Hodgkin lymphoma: Secondary | ICD-10-CM

## 2024-06-22 DIAGNOSIS — E271 Primary adrenocortical insufficiency: Secondary | ICD-10-CM

## 2024-06-22 DIAGNOSIS — N179 Acute kidney failure, unspecified: Secondary | ICD-10-CM | POA: Diagnosis not present

## 2024-06-22 DIAGNOSIS — B3781 Candidal esophagitis: Secondary | ICD-10-CM

## 2024-06-22 LAB — CBC
HCT: 34 % — ABNORMAL LOW (ref 36.0–46.0)
Hemoglobin: 10.5 g/dL — ABNORMAL LOW (ref 12.0–15.0)
MCH: 24.3 pg — ABNORMAL LOW (ref 26.0–34.0)
MCHC: 30.9 g/dL (ref 30.0–36.0)
MCV: 78.7 fL — ABNORMAL LOW (ref 80.0–100.0)
Platelets: 209 K/uL (ref 150–400)
RBC: 4.32 MIL/uL (ref 3.87–5.11)
RDW: 23 % — ABNORMAL HIGH (ref 11.5–15.5)
WBC: 5.1 K/uL (ref 4.0–10.5)
nRBC: 0 % (ref 0.0–0.2)

## 2024-06-22 LAB — COMPREHENSIVE METABOLIC PANEL WITH GFR
ALT: 119 U/L — ABNORMAL HIGH (ref 0–44)
AST: 74 U/L — ABNORMAL HIGH (ref 15–41)
Albumin: 4 g/dL (ref 3.5–5.0)
Alkaline Phosphatase: 79 U/L (ref 38–126)
Anion gap: 13 (ref 5–15)
BUN: 20 mg/dL (ref 6–20)
CO2: 21 mmol/L — ABNORMAL LOW (ref 22–32)
Calcium: 8.1 mg/dL — ABNORMAL LOW (ref 8.9–10.3)
Chloride: 105 mmol/L (ref 98–111)
Creatinine, Ser: 1.43 mg/dL — ABNORMAL HIGH (ref 0.44–1.00)
GFR, Estimated: 44 mL/min — ABNORMAL LOW (ref >60)
Glucose, Bld: 164 mg/dL — ABNORMAL HIGH (ref 70–99)
Potassium: 3.2 mmol/L — ABNORMAL LOW (ref 3.5–5.1)
Sodium: 140 mmol/L (ref 135–145)
Total Bilirubin: <0.2 mg/dL (ref 0.0–1.2)
Total Protein: 6.9 g/dL (ref 6.5–8.1)

## 2024-06-22 LAB — HEPATITIS PANEL, ACUTE
HCV Ab: NONREACTIVE
Hep A IgM: NONREACTIVE
Hep B C IgM: NONREACTIVE
Hepatitis B Surface Ag: NONREACTIVE

## 2024-06-22 LAB — LACTIC ACID, PLASMA: Lactic Acid, Venous: 0.8 mmol/L (ref 0.5–1.9)

## 2024-06-22 MED ORDER — FLUOXETINE HCL 20 MG PO CAPS
40.0000 mg | ORAL_CAPSULE | Freq: Every day | ORAL | Status: DC
  Administered 2024-06-22 – 2024-06-26 (×4): 40 mg via ORAL
  Filled 2024-06-22 (×6): qty 2

## 2024-06-22 MED ORDER — ROSUVASTATIN CALCIUM 10 MG PO TABS
10.0000 mg | ORAL_TABLET | Freq: Every day | ORAL | Status: DC
Start: 2024-06-22 — End: 2024-06-27
  Administered 2024-06-22 – 2024-06-26 (×4): 10 mg via ORAL
  Filled 2024-06-22 (×6): qty 1

## 2024-06-22 MED ORDER — ASPIRIN 81 MG PO TBEC
81.0000 mg | DELAYED_RELEASE_TABLET | Freq: Every day | ORAL | Status: DC
Start: 2024-06-22 — End: 2024-06-22

## 2024-06-22 MED ORDER — LAMOTRIGINE 100 MG PO TABS
200.0000 mg | ORAL_TABLET | Freq: Every day | ORAL | Status: DC
Start: 2024-06-22 — End: 2024-06-27
  Administered 2024-06-22 – 2024-06-26 (×5): 200 mg via ORAL
  Filled 2024-06-22 (×5): qty 2

## 2024-06-22 MED ORDER — KCL IN DEXTROSE-NACL 40-5-0.9 MEQ/L-%-% IV SOLN
INTRAVENOUS | Status: AC
  Filled 2024-06-22 (×3): qty 1000

## 2024-06-22 MED ORDER — BUPROPION HCL ER (XL) 150 MG PO TB24
150.0000 mg | ORAL_TABLET | Freq: Every day | ORAL | Status: DC
Start: 2024-06-22 — End: 2024-06-27
  Administered 2024-06-22 – 2024-06-26 (×4): 150 mg via ORAL
  Filled 2024-06-22 (×6): qty 1

## 2024-06-22 MED ORDER — ASPIRIN 81 MG PO TBEC
81.0000 mg | DELAYED_RELEASE_TABLET | Freq: Every day | ORAL | Status: DC
Start: 2024-06-25 — End: 2024-06-27
  Administered 2024-06-26: 81 mg via ORAL
  Filled 2024-06-22 (×2): qty 1

## 2024-06-22 MED ORDER — TOPIRAMATE 25 MG PO TABS
150.0000 mg | ORAL_TABLET | Freq: Every day | ORAL | Status: DC
Start: 2024-06-22 — End: 2024-06-27
  Administered 2024-06-22 – 2024-06-26 (×5): 150 mg via ORAL
  Filled 2024-06-22 (×5): qty 2

## 2024-06-22 MED ORDER — ZOLPIDEM TARTRATE 5 MG PO TABS
5.0000 mg | ORAL_TABLET | Freq: Every evening | ORAL | Status: DC | PRN
  Administered 2024-06-22 – 2024-06-26 (×5): 5 mg via ORAL
  Filled 2024-06-22 (×5): qty 1

## 2024-06-22 MED ORDER — FLUOXETINE HCL 40 MG PO CAPS: 40.0000 mg | ORAL_CAPSULE | Freq: Every day | ORAL | Status: DC

## 2024-06-22 MED ORDER — PANTOPRAZOLE SODIUM 40 MG IV SOLR
40.0000 mg | Freq: Two times a day (BID) | INTRAVENOUS | Status: DC
  Administered 2024-06-22 – 2024-06-27 (×11): 40 mg via INTRAVENOUS
  Filled 2024-06-22 (×11): qty 10

## 2024-06-22 MED ORDER — LEVOTHYROXINE SODIUM 100 MCG PO TABS
100.0000 ug | ORAL_TABLET | Freq: Every morning | ORAL | Status: DC
  Administered 2024-06-22 – 2024-06-27 (×6): 100 ug via ORAL
  Filled 2024-06-22 (×7): qty 1

## 2024-06-22 MED ORDER — BOOST / RESOURCE BREEZE PO LIQD CUSTOM
1.0000 | Freq: Three times a day (TID) | ORAL | Status: DC
  Administered 2024-06-22 – 2024-06-24 (×5): 1 via ORAL

## 2024-06-22 MED ORDER — SODIUM CHLORIDE 0.9 % IV SOLN: INTRAVENOUS | Status: DC

## 2024-06-22 MED ORDER — LAMOTRIGINE 25 MG PO TABS
50.0000 mg | ORAL_TABLET | Freq: Every day | ORAL | Status: DC
Start: 2024-06-23 — End: 2024-06-27
  Administered 2024-06-23 – 2024-06-26 (×3): 50 mg via ORAL
  Filled 2024-06-22 (×5): qty 2

## 2024-06-22 MED ORDER — CYCLOBENZAPRINE HCL 10 MG PO TABS
10.0000 mg | ORAL_TABLET | Freq: Two times a day (BID) | ORAL | Status: DC | PRN
  Administered 2024-06-23 – 2024-06-24 (×2): 10 mg via ORAL
  Filled 2024-06-22 (×2): qty 1

## 2024-06-22 NOTE — Telephone Encounter (Signed)
 Returned call to patient. Patient is currently admitted. Will send MyChart message to patient to follow up once she is discharged if she continues to have GI concerns.

## 2024-06-22 NOTE — Plan of Care (Signed)
  Problem: Education: Goal: Knowledge of General Education information will improve Description: Including pain rating scale, medication(s)/side effects and non-pharmacologic comfort measures Outcome: Progressing   Problem: Clinical Measurements: Goal: Ability to maintain clinical measurements within normal limits will improve Outcome: Progressing   Problem: Activity: Goal: Risk for activity intolerance will decrease Outcome: Progressing   Problem: Coping: Goal: Level of anxiety will decrease Outcome: Progressing   Problem: Safety: Goal: Ability to remain free from injury will improve Outcome: Progressing   Problem: Skin Integrity: Goal: Risk for impaired skin integrity will decrease Outcome: Progressing   

## 2024-06-22 NOTE — Consult Note (Addendum)
 Referring Provider: Dr. Raenelle, TRH Primary Care Physician:  Toppin, Cy HERO, MD Primary Gastroenterologist:  Dr. San  Reason for Consultation:  N/V, dysphagia  HPI: [name] is a 53 y.o. female with past medical history of chronic lymphedema, hx cushings secondary to pituitary adenoma s/p resection and gamma knife complicated by adrenal insufficiency, history of Hodgkin's at age 89 s/p chemoradiation, breast cancer s/p chemotherapy and bilateral mastectomy in 2000 which is in remission, cervical cancer s/p LEEP 2002, ovarian tumor precancerous s/p TH and left oophorectomy in 2011 and right in 2016, hx postablative hypothyroidism, ADHD, anxiety/depression and chronic hypoxemic respiratory failure secondary to unknown etiology (on 5L home O2), history of DVTs.  She is on long-term steroids for her Addison's and has had multiple courses of antibiotics.  Currently on Dexilant  60 mg daily, famotidine  liquid at bedtime, along with liquid Carafate , GI cocktail on demand.   Just saw Dr. San on 06/03/2024 with plan for EGD with dilation (scheduled for November because needs to be done at [facility]).  Also scheduled for esophageal manometry in November.  Plan was to consult IR for possible PEG placement and ID for recurrent esophageal candidiasis.  Came to the hospital again for inability to take much by mouth.  Says that she does tolerate liquids, even ice cream and Protein shakes.  Meds to do stay down well though.  K+ low at 2.6, Cr up from baseline.  CT scan abdomen and pelvis without contrast 06/21/24:  IMPRESSION: 1. No acute intra-abdominal process. 2. Bilateral nephrolithiasis without evidence of obstruction. 3. Complex lesion in in the lower pole of the left kidney with indeterminate imaging characteristics. Ultrasound or MRI is recommended for further characterization on follow-up. 4. Moderate amount of retained stool in the colon suggesting constipation. 5. Aortic  atherosclerosis.    08/01/2020 colonoscopy, recall 10 years Impression Nonthrombosed external hemorrhoids found on perianal exam Internal hemorrhoids The examination was otherwise normal No specimens collected. 08/01/2020 EGD Findings Normal esophagus 3 cm hiatal hernia Multiple fundic gland polyps Normal examined duodenum No source of hematemesis is identified Biopsies of the greater curvature of the gastric body, lesser curvature of the gastric body, at the incisura, on the greater curvature of the gastric atrium and on the lesser curvature of the gastric atrium. Path stomach biopsy Atrial and oxyntic mucosa with no significant pathologic findings.  Negative for H. Pylori. 09/16/23 MBSS-  Clinical Impression:  Patient presents with a normal oropharyngeal swallow however she does present with a suspected primary esophageal dysphagia as per this modified barium swallow study. 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 Thicker barium consistencies (honey thick, pudding thick and mechanical soft solids did transit a little slowly through PES and upper esophagus but no retrograde movement or stasis of barium visualized in upper esophagus or at level of PES. Patient attempted to swallow barium tablet with plain water  but she gagged while tablet still in mouth and spit it out. She reports this is something that can occur without warning when she is eating or taking medications. No aspiration or penetration observed and oral and pharyngeal structures all appeared to be [facility]. SLP recommended patient see an outpatient SLP for help with reintroducing solid foods into her diet.  12/07/23 CT Soft tissue Neck WO contrast IMPRESSION: 1. No acute or focal lesion to explain the patient's symptoms. No radiopaque foreign body or soft tissue nodule to account for a medicine tablet or capsule in the neck. 2. Ill-defined  airspace opacities in the right upper lobe  raise concern for pneumonia.  12/26/2023: EGD: 2 cm hiatal hernia, severe esophageal candidiasis, suspected benign-appearing esophageal stenosis in the proximal esophagus not dilated due to severe candidiasis, benign fundic gland polyps, gastric AVM treated with APC 02/11/2024: EGD: Benign-appearing esophageal stenosis dilated with endoscope:, esophageal candidiasis (improved from previous), 2 cm hiatal hernia, fundic gland polyps 04/13/2024: EGD: Benign-appearing stenosis at 16 cm dilated with 16 mm Savary with appropriate mucosal disruption, subtle white plaques in the lower esophagus, 1-2 cm hiatal hernia, fundic gland polyps   Scheduled for esophageal manometry and pH/impedance study (on PPI) on 08/05/2024, but doubtful that she can tolerate those procedures.   Past Medical History:  Diagnosis Date   Addison's disease (HCC)    Adrenal insufficiency (HCC)    Anemia    Anxiety    Aortic stenosis    Aortic stenosis    Appendicitis 12/19/2009   Appendicitis    Breast cancer (HCC)    STATUS POST BILATERAL MASTECTOMY. STATUS POST RECONSTRUCTION. SHE HAD SILICONE BREAST IMPLANTS AND THE LEFT IMPLANT IS LEAKING SLIGHTLY   Cellulitis of right middle finger 11/07/2018   Cervical cancer (HCC) 12/23/2018   Chest pain    CHF with right heart failure (HCC) 04/17/2017   Chronic respiratory failure with hypoxia (HCC) 12/23/2018   CKD (chronic kidney disease) stage 3, GFR 30-59 ml/min (HCC)    Cough variant asthma 04/13/2019   Depression    Functional neurological symptom disorder with mixed symptoms 2023   GERD (gastroesophageal reflux disease)    takes Dexilant  and carafate  and gi coctail    Headache    migraines on a daily and monthly regimen    Heart murmur    History of kidney stones    Hodgkin lymphoma (HCC)    STATUS POST MANTLE RADIATION   Hodgkin's lymphoma (HCC)    1987   Hypertension    Hypoxia    Multiple lung nodules    bilateral   Necrotizing fasciitis (HCC) 12/23/2018    Non-ischemic cardiomyopathy (HCC)    Osteoporosis    Ovarian tumor (benign)    bilateral   Palpitations    Pituitary adenoma (HCC) 12/23/2018   Pneumonia    PONV (postoperative nausea and vomiting)    Pre-diabetes    per pt; no meds   Pulmonary hypertension (HCC) 12/23/2018   Raynaud phenomenon    Right heart failure (HCC) 04/17/2017   Seizures (HCC)    last febrile seizure was approx 3 weeks ago per report on 12/01/2020   Supplemental oxygen  dependent    3 liters   SVT (supraventricular tachycardia) (HCC)    Tachycardia    Thyroid  cancer (HCC)    STATUS POST SURGICAL REMOVAL-CURRENT ON THYROID  REPLACEMENT    Past Surgical History:  Procedure Laterality Date   ABDOMINAL HYSTERECTOMY     AMPUTATION Left 01/30/2019   Procedure: Left Index finger amputation with flap reconstruction and repair reconstruction;  Surgeon: Camella Fallow, MD;  Location: [facility] OR;  Service: Orthopedics;  Laterality: Left;   APPENDECTOMY     BIOPSY OF SKIN SUBCUTANEOUS TISSUE AND/OR MUCOUS MEMBRANE  12/26/2023   Procedure: BIOPSY, SKIN, SUBCUTANEOUS TISSUE, OR MUCOUS MEMBRANE;  Surgeon: Leigh Elspeth SQUIBB, MD;  Location: [facility];  Service: Gastroenterology;;   breast implants and removal      breast implants but leaking      CARDIAC CATHETERIZATION  05/18/2009   NORMAL CATH   CESAREAN SECTION  2007   and 1997  COLONOSCOPY     ESOPHAGEAL DILATION  02/11/2024   Procedure: DILATION, ESOPHAGUS;  Surgeon: San Sandor GAILS, DO;  Location: [facility];  Service: Gastroenterology;;   ESOPHAGOGASTRODUODENOSCOPY N/A 12/26/2023   Procedure: EGD (ESOPHAGOGASTRODUODENOSCOPY);  Surgeon: Leigh Elspeth SQUIBB, MD;  Location: [facility];  Service: Gastroenterology;  Laterality: N/A;   ESOPHAGOGASTRODUODENOSCOPY N/A 02/11/2024   Procedure: EGD (ESOPHAGOGASTRODUODENOSCOPY);  Surgeon: San Sandor GAILS, DO;  [facility]: [facility];  Service: Gastroenterology;  Laterality: N/A;   ESOPHAGOGASTRODUODENOSCOPY N/A  04/13/2024   Procedure: EGD (ESOPHAGOGASTRODUODENOSCOPY);  Surgeon: San Sandor GAILS, DO;  Location: [facility];  Service: Gastroenterology;  Laterality: N/A;   HOT HEMOSTASIS N/A 12/26/2023   Procedure: EGD, WITH ARGON PLASMA COAGULATION;  Surgeon: Leigh Elspeth SQUIBB, MD;  Location: [facility];  Service: Gastroenterology;  Laterality: N/A;   hx of chemotherapy      hx of radiation therapy      I & D EXTREMITY Left 12/23/2018   Procedure: IRRIGATION AND DEBRIDEMENT HAND / INDEX FINGER;  Surgeon: Camella Fallow, MD;  Location: [facility];  Service: Orthopedics;  Laterality: Left;   IR CV LINE INJECTION  03/22/2022   IR FLUORO GUIDE CV LINE RIGHT  10/09/2022   IR FLUORO GUIDE CV LINE RIGHT  01/18/2023   IR FLUORO GUIDE CV LINE RIGHT  03/26/2023   IR FLUORO GUIDE CV LINE RIGHT  10/10/2023   IR FLUORO GUIDE CV LINE RIGHT  01/06/2024   IR IMAGING GUIDED PORT INSERTION  05/06/2020   IR IMAGING GUIDED PORT INSERTION  12/04/2021   IR IMAGING GUIDED PORT INSERTION  02/25/2024   IR PATIENT EVAL TECH 0-60 MINS  03/12/2024   IR RADIOLOGIST EVAL & MGMT  03/12/2024   IR RADIOLOGIST EVAL & MGMT  03/31/2024   IR REMOVAL TUN ACCESS W/ PORT W/O FL MOD SED  04/27/2020   IR REMOVAL TUN ACCESS W/ PORT W/O FL MOD SED  12/04/2021   IR REMOVAL TUN ACCESS W/ PORT W/O FL MOD SED  03/30/2022   IR REMOVAL TUN ACCESS W/ PORT W/O FL MOD SED  02/27/2024   IR REMOVAL TUN CV CATH W/O FL  02/25/2024   IR US  GUIDE VASC ACCESS RIGHT  10/09/2022   KIDNEY STONE SURGERY     LUMBAR PUNCTURE W/ INTRATHECAL CHEMOTHERAPY     MASTECTOMY     PITUITARY SURGERY     RIGHT/LEFT HEART CATH AND CORONARY ANGIOGRAPHY N/A 04/02/2018   Procedure: RIGHT/LEFT HEART CATH AND CORONARY ANGIOGRAPHY;  Surgeon: Verlin Lonni BIRCH, MD;  [facility]: [facility];  Service: Cardiovascular;  Laterality: N/A;   RIGHT/LEFT HEART CATH AND CORONARY ANGIOGRAPHY N/A 08/31/2022   Procedure: RIGHT/LEFT HEART CATH AND CORONARY ANGIOGRAPHY;  Surgeon:  Cherrie Toribio SAUNDERS, MD;  Location: [facility];  Service: Cardiovascular;  Laterality: N/A;   TOOTH EXTRACTION N/A 12/05/2020   Procedure: DENTAL RESTORATION/EXTRACTIONS;  Surgeon: Arvil Lonni RAMAN, MD;  Location: [facility];  Service: Oral Surgery;  Laterality: N/A;   TOTAL THYROIDECTOMY     VIDEO BRONCHOSCOPY Bilateral 11/14/2018   Procedure: VIDEO BRONCHOSCOPY WITHOUT FLUORO;  Surgeon: Kassie Acquanetta Bradley, MD;  Location: [facility] ENDOSCOPY;  Service: Cardiopulmonary;  Laterality: Bilateral;   VIDEO BRONCHOSCOPY WITH ENDOBRONCHIAL ULTRASOUND N/A 11/19/2018   Procedure: VIDEO BRONCHOSCOPY WITH ENDOBRONCHIAL ULTRASOUND;  Surgeon: Kassie Acquanetta Bradley, MD;  Location: [facility] OR;  Service: Thoracic;  Laterality: N/A;    Prior to Admission medications   Medication Sig Start Date End Date Taking? Authorizing Provider  albuterol  (PROVENTIL ) (2.5 MG/3ML) 0.083% nebulizer solution  INHALE 3 ML BY NEBULIZATION EVERY 6 HOURS AS NEEDED FOR WHEEZING OR SHORTNESS OF BREATH 12/13/23   Kassie Acquanetta Bradley, MD  albuterol  (VENTOLIN  HFA) 108 (90 Base) MCG/ACT inhaler Inhale 2 puffs into the lungs every 6 (six) hours as needed for wheezing or shortness of breath. 01/08/24   Kassie Acquanetta Bradley, MD  aspirin  EC 81 MG tablet Take 1 tablet (81 mg total) by mouth daily. Swallow whole. Patient taking differently: Take 81 mg by mouth daily. Chewable 07/16/22   Nahser, Aleene PARAS, MD  Bismuth Tribromoph-Petrolatum (XEROFORM OCCLUSIVE GAUZE STRIP) PADS Apply 1 each topically as directed. 09/17/22   Tobie Yetta HERO, MD  bumetanide  (BUMEX ) 1 MG tablet Take 1 tablet (1 mg total) by mouth 2 (two) times daily. 03/11/24   Nahser, Aleene PARAS, MD  buPROPion  (WELLBUTRIN  XL) 150 MG 24 hr tablet Take 1 tablet (150 mg total) by mouth daily. 05/25/24   Mozingo, Regina Nattalie, NP  clonazePAM  (KLONOPIN ) 1 MG tablet TAKE 1 TABLET BY MOUTH 3 TIMES A DAY AS NEEDED AND 1 EXTRA FOR SEVERE ANXIETY UP TO 10 A MONTH. 05/25/24   Mozingo, Regina Nattalie, NP   cyclobenzaprine  (FLEXERIL ) 10 MG tablet Take 1 tablet (10 mg total) by mouth 2 (two) times daily as needed for muscle spasms. 05/29/24   Leath-Warren, Etta PARAS, NP  dexlansoprazole  (DEXILANT ) 60 MG capsule Take 1 capsule (60 mg total) by mouth daily. 04/13/24   Cirigliano, Vito V, DO  diclofenac  (VOLTAREN ) 50 MG EC tablet Take 50 mg by mouth 2 (two) times daily as needed (can repeat if needed). 05/29/24   [provider]  diphenhydrAMINE  (BENADRYL ) 50 MG/ML injection Inject 25 mg into the vein 5 (five) times daily as needed (for nausea  (is 50 mg/ml)).    [provider]  EPINEPHRINE  0.3 mg/0.3 mL IJ SOAJ injection INJECT 0.3 MG INTO THE MUSCLE AS NEEDED FOR ANAPHYLAXIS. 04/25/23   Kassie Acquanetta Bradley, MD  famotidine  (PEPCID ) 40 MG/5ML suspension Take 2.5 mLs (20 mg total) by mouth daily. Patient taking differently: Take 20 mg by mouth daily as needed for heartburn or indigestion. 04/13/24 06/03/24  Cirigliano, Vito V, DO  FLORASTOR 250 MG capsule Take 250 mg by mouth 2 (two) times daily.    [provider]  FLUoxetine  (PROZAC ) 40 MG capsule Take 1 capsule (40 mg total) by mouth daily. 05/25/24   Mozingo, Regina Nattalie, NP  fluticasone  furoate-vilanterol (BREO ELLIPTA ) 100-25 MCG/ACT AEPB Inhale 2 puffs into the lungs daily.    [provider]  fluticasone -salmeterol (ADVAIR HFA) 230-21 MCG/ACT inhaler Inhale 2 puffs into the lungs 2 (two) times daily. Patient taking differently: Inhale 2 puffs into the lungs 2 (two) times daily as needed (for flares). 01/08/24   Kassie Acquanetta Bradley, MD  Galcanezumab -gnlm (EMGALITY  [place]) Inject into the skin every 30 (thirty) days.    [provider]  guaiFENesin  (ROBITUSSIN) 100 MG/5ML liquid Take 10 mLs by mouth every 6 (six) hours as needed for cough or to loosen phlegm. 05/22/24   Von Bellis, MD  Heparin  Na, Pork, Lock Flsh PF (BD HEPARIN  POSIFLUSH) 100 UNIT/ML SOLN USE 5 MLS IN PORT A CATH ONCE DAILY AFTER MEDICATION  ADMINISTRATION AS A HEPLOCK AS DIRECTED. 04/01/24     hydrocortisone  (CORTEF ) 10 MG tablet Take 1-2 tablets (10-20 mg total) by mouth See admin instructions. Take 20 mg in the am and 10mg  in the evening. Take after completing prednisone  Patient taking differently: Take 10-20 mg by mouth See admin instructions. Take  20 mg by mouth in the morning and 10 mg in the afternoon. 09/29/22   Tobie Yetta HERO, MD  hydrOXYzine  (ATARAX ) 10 MG/5ML syrup Take 12.5 mLs (25 mg total) by mouth 3 (three) times daily. 05/22/24   Von Bellis, MD  hyoscyamine  (LEVSIN  SL) 0.125 MG SL tablet Place 1 tablet (0.125 mg total) under the tongue every 6 (six) hours as needed for cramping (esophageal spasm). Up to 1.25 mg daily 05/22/24   Von Bellis, MD  ipratropium-albuterol  (DUONEB) 0.5-2.5 (3) MG/3ML SOLN Take 3 mLs by nebulization every 6 (six) hours as needed (for shortness of breath or wheezing).    [provider]  lamoTRIgine  (LAMICTAL ) 200 MG tablet TAKE 1 TABLET BY MOUTH EVERYDAY AT BEDTIME 05/25/24   Mozingo, Regina Nattalie, NP  lamoTRIgine  (LAMICTAL ) 25 MG tablet Take two tablets every morning. 05/25/24   Mozingo, Regina Nattalie, NP  lidocaine  (LIDODERM ) 5 % Place 1 patch onto the skin daily. Remove & Discard patch within 12 hours or as directed by MD 05/29/24   Leath-Warren, Etta PARAS, NP  lidocaine  (XYLOCAINE ) 2 % solution Use as directed 10 mLs in the mouth or throat every 4 (four) hours as needed (for esophageal pain and pain with swallowing.). 12/22/23   Harris, Abigail, PA-C  nitroGLYCERIN  (NITROSTAT ) 0.4 MG SL tablet DISSOLVE 1 TAB UNDER THE TONGUE EVERY 5 MINUTES AS NEEDED FOR CHEST PAIN. MAX OF 3 DOSES, THEN 911. 07/03/23   Nahser, Aleene PARAS, MD  NURTEC 75 MG TBDP Take 75 mg by mouth daily as needed (migraines). 09/03/23   [provider]  OLANZapine  (ZYPREXA ) 5 MG tablet Take 1 tablet (5 mg total) by mouth at bedtime. 05/25/24   Mozingo, Regina Nattalie, NP  OXYGEN  Inhale 3-5 L/min into the lungs  continuous.    [provider]  potassium chloride  (KLOR-CON ) 20 MEQ packet Take 20 mEq by mouth daily. Can be mixed in the juice for better test 05/22/24 05/22/25  Von Bellis, MD  QVAR  REDIHALER 80 MCG/ACT inhaler Inhale 2 puffs into the lungs daily. 05/10/23   [provider]  rosuvastatin  (CRESTOR ) 10 MG tablet Take 10 mg by mouth in the morning. Mid morning 02/28/18   [provider]  sacubitril -valsartan  (ENTRESTO ) 24-26 MG Take 1 tablet by mouth 2 (two) times daily.    [provider]  sodium chloride  flush (BD POSIFLUSH) 0.9 % SOLN injection Use as directed for catheter maintenance 02/07/24     Sodium Chloride  Flush (NORMAL SALINE FLUSH) 0.9 % SOLN Use as directed for catheter maintenance. 02/07/24     Sodium Chloride  Flush (NORMAL SALINE FLUSH) 0.9 % SOLN Use 10mls as directed for catheter maintenance 05/29/24     sucralfate  (CARAFATE ) 1 GM/10ML suspension Take 1 g by mouth daily as needed (as directed for ulcers).    [provider]  SYNTHROID  100 MCG tablet Take 1 tablet (100 mcg total) by mouth every morning. Patient taking differently: Take 100 mcg by mouth every morning.  Must be name brand* 10/19/21   Gudena, Vinay, MD  Tiotropium Bromide  Monohydrate (SPIRIVA  RESPIMAT) 1.25 MCG/ACT AERS Inhale 2 puffs into the lungs daily. 01/08/24   Kassie Acquanetta Bradley, MD  topiramate  (TOPAMAX ) 50 MG tablet Take 150 mg by mouth at bedtime. 12/25/19   [provider]  zolpidem  (AMBIEN  CR) 12.5 MG CR tablet TAKE ONE TABLET BY MOUTH AT BEDTIME AS NEEDED FOR SLEEP. Patient taking differently: Take 12.5 mg by mouth at bedtime. 05/14/23       Current Facility-Administered  Medications  Medication Dose Route Frequency Provider Last Rate Last Admin   0.9 %  sodium chloride  infusion   Intravenous Continuous Debby Camila LABOR, MD 75 mL/hr at 06/22/24 0758 Infusion Verify at 06/22/24 0758   acetaminophen  (TYLENOL ) tablet 650 mg  650 mg Oral Q6H PRN Thomas, Sara-Maiz A,  MD   650 mg at 06/22/24 0109   Or   acetaminophen  (TYLENOL ) suppository 650 mg  650 mg Rectal Q6H PRN Debby Camila LABOR, MD       albuterol  (PROVENTIL ) (2.5 MG/3ML) 0.083% nebulizer solution 2.5 mg  2.5 mg Nebulization Q2H PRN Thomas, Sara-Maiz A, MD       aspirin  EC tablet 81 mg  81 mg Oral Daily Debby Camila LABOR, MD       bisacodyl  (DULCOLAX) suppository 10 mg  10 mg Rectal Daily PRN Debby Camila LABOR, MD       Chlorhexidine  Gluconate Cloth 2 % PADS 6 each  6 each Topical Daily Opyd, Timothy S, MD       diphenhydrAMINE  (BENADRYL ) injection 25 mg  25 mg Intravenous Q6H PRN Thomas, Sara-Maiz A, MD   25 mg at 06/22/24 0308   feeding supplement (BOOST / RESOURCE BREEZE) liquid 1 Container  1 Container Oral TID BM Opyd, Evalene RAMAN, MD       fentaNYL  (SUBLIMAZE ) injection 12.5 mcg  12.5 mcg Intravenous Q2H PRN Debby Camila LABOR, MD       heparin  injection 5,000 Units  5,000 Units Subcutaneous Q8H Debby Camila A, MD   5,000 Units at 06/22/24 9488   hydrALAZINE  (APRESOLINE ) injection 5 mg  5 mg Intravenous Q8H PRN Debby Camila LABOR, MD       hydrocortisone  sodium succinate  (SOLU-CORTEF ) 100 MG injection 100 mg  100 mg Intravenous Q8H Debby Camila A, MD   100 mg at 06/22/24 9488   levothyroxine  (SYNTHROID ) tablet 100 mcg  100 mcg Oral q morning Debby Camila LABOR, MD       LORazepam  (ATIVAN ) injection 0.5 mg  0.5 mg Intravenous Q6H PRN Debby Camila A, MD   0.5 mg at 06/22/24 0818   rosuvastatin  (CRESTOR ) tablet 10 mg  10 mg Oral q AM Debby Camila LABOR, MD       sodium chloride  flush (NS) 0.9 % injection 10-40 mL  10-40 mL Intracatheter Q12H Opyd, Evalene RAMAN, MD   10 mL at 06/21/24 2347   sodium chloride  flush (NS) 0.9 % injection 10-40 mL  10-40 mL Intracatheter PRN Opyd, Evalene RAMAN, MD        Allergies as of 06/21/2024 - Review Complete 06/21/2024  Allergen Reaction Noted   Ferrous bisglycinate chelate [iron] Anaphylaxis and Other (See Comments) 07/15/2020   Mushroom  extract complex (obsolete) Anaphylaxis 04/23/2019   Na ferric gluc cplx in sucrose Anaphylaxis 11/16/2014   Tape Hives 02/03/2015   Cymbalta [duloxetine hcl] Swelling and Anxiety 02/06/2011   Hydromorphone  Other (See Comments) 05/08/2016   Morphine Other (See Comments) 05/18/2024   Ondansetron  hcl Itching and Other (See Comments) 05/22/2022   Promethazine Other (See Comments) 08/03/2021   Succinylcholine Other (See Comments) 12/23/2018   Buprenorphine hcl Hives 02/03/2015   Compazine Other (See Comments) 02/06/2011   Duloxetine  02/06/2011   Metoclopramide Other (See Comments) 05/08/2016   Morphine and codeine Hives 02/06/2011   Ondansetron  Hives and Rash 02/03/2015   Promethazine hcl Hives 02/06/2011   Silver  Other (See Comments) and Rash 02/06/2011    Family History  Adopted: Yes  Family history unknown: Yes  Social History   Socioeconomic History   Marital status: Married    Spouse name: Not on file   Number of children: Not on file   Years of education: Not on file   Highest education level: Not on file  Occupational History   Not on file  Tobacco Use   Smoking status: Never   Smokeless tobacco: Never  Vaping Use   Vaping status: Never Used  Substance and Sexual Activity   Alcohol use: Not Currently    Comment: social    Drug use: No   Sexual activity: Not Currently    Birth control/protection: Surgical  Other Topics Concern   Not on file  Social History Narrative   Not on file   Social Drivers of Health   Financial Resource Strain: Medium Risk (08/21/2017)   Received from [facility]   Overall Financial Resource Strain (CARDIA)    Difficulty of Paying Living Expenses: Somewhat hard  Food Insecurity: No Food Insecurity (06/21/2024)   Hunger Vital Sign    Worried About Running Out of Food in the Last Year: Never true    Ran Out of Food in the Last Year: Never true  Transportation Needs: No Transportation Needs (06/21/2024)   PRAPARE -  Administrator, Civil Service (Medical): No    Lack of Transportation (Non-Medical): No  Physical Activity: Unknown (08/21/2017)   Received from [facility]   Exercise Vital Sign    Days of Exercise per Week: 0 days    Minutes of Exercise per Session: Not on file  Stress: Stress Concern Present (08/21/2017)   Received from [facility] - Occupational Stress Questionnaire    Feeling of Stress : Very much  Social Connections: Unknown (02/12/2022)   Received from [facility]   Social Network    Social Network: Not on file  Intimate Partner Violence: Not At Risk (06/21/2024)   Humiliation, Afraid, Rape, and Kick questionnaire    Fear of Current or Ex-Partner: No    Emotionally Abused: No    Physically Abused: No    Sexually Abused: No    Review of Systems: ROS otherwise negative except as mentioned in HPI.  Physical Exam: Vital signs in last 24 hours: Temp:  [97.3 F (36.3 C)-98.9 F (37.2 C)] 97.8 F (36.6 C) (09/22 0836) Pulse Rate:  [81-95] 83 (09/22 0836) Resp:  [11-18] 14 (09/22 0445) BP: (111-137)/(74-94) 121/77 (09/22 0836) SpO2:  [99 %-100 %] 100 % (09/22 0836) Weight:  [75.2 kg] 75.2 kg (09/21 2103) Last BM Date : 06/21/24 General:  Alert, Well-developed, well-nourished, pleasant and cooperative in NAD Head:  Normocephalic and atraumatic. Eyes:  Sclera clear, no icterus.  Conjunctiva pink. Ears:  Normal auditory acuity. Mouth:  No deformity or lesions.   Lungs:  Clear throughout to auscultation.  No wheezes, crackles, or rhonchi.  Heart:  Regular rate and rhythm; no murmurs, clicks, rubs, or gallops. Abdomen:  Soft, non-distended.  BS present.  Non-tender. Msk:  Symmetrical without gross deformities.  Pulses:  Normal pulses noted. Extremities:  Without clubbing or edema. Neurologic:  Alert and oriented x 4;  grossly normal neurologically. Skin:  Intact without significant  lesions or rashes. Psych:  Alert and cooperative. Normal mood and affect.  Intake/Output from previous day: 09/21 0701 - 09/22 0700 In: 815.1 [I.V.:383.4; IV Piggyback:431.8] Out: -  Intake/Output this shift: Total I/O In: 261.5 [I.V.:261.5] Out: -   Lab Results:  Recent Labs    06/21/24 1701 06/21/24 2200 06/22/24 0331  WBC 7.2 5.7 5.1  HGB 12.2 12.0 10.5*  HCT 38.5 42.2 34.0*  PLT 271 222 209   BMET Recent Labs    06/21/24 1701 06/21/24 2200 06/22/24 0331  NA 139 137 140  K 2.6* 3.6 3.2*  CL 98 103 105  CO2 23 17* 21*  GLUCOSE 129* 110* 164*  BUN 27* 26* 20  CREATININE 1.93* 1.56* 1.43*  CALCIUM  9.6 8.3* 8.1*   LFT Recent Labs    06/22/24 0331  PROT 6.9  ALBUMIN 4.0  AST 74*  ALT 119*  ALKPHOS 79  BILITOT <0.2   Hepatitis Panel Recent Labs    06/21/24 2200  HEPBSAG NON REACTIVE  HCVAB NON REACTIVE  HEPAIGM NON REACTIVE  HEPBIGM NON REACTIVE    Studies/Results: DG Chest 2 View Result Date: 06/21/2024 CLINICAL DATA:  Nausea and vomiting. EXAM: CHEST - 2 VIEW COMPARISON:  05/18/2024. FINDINGS: The heart size and mediastinal contours are within normal limits. There is atherosclerotic calcification of the aorta. Focal airspace disease is noted in the retrocardiac region on the left. No effusion or pneumothorax is seen. A left PICC line appear stable. Surgical clips are noted in the chest walls bilaterally. No acute osseous abnormality. IMPRESSION: Airspace disease in the retrocardiac region on the left, possible residual pneumonia. Electronically Signed   By: Leita Birmingham M.D.   On: 06/21/2024 18:36   CT ABDOMEN PELVIS WO CONTRAST Result Date: 06/21/2024 CLINICAL DATA:  Left flank pain with vomiting. EXAM: CT ABDOMEN AND PELVIS WITHOUT CONTRAST TECHNIQUE: Multidetector CT imaging of the abdomen and pelvis was performed following the standard protocol without IV contrast. RADIATION DOSE REDUCTION: This exam was performed according to the departmental  dose-optimization program which includes automated exposure control, adjustment of the mA and/or kV according to patient size and/or use of iterative reconstruction technique. COMPARISON:  01/23/2023. FINDINGS: Lower chest: Atelectasis is noted at the lung bases. Hepatobiliary: Stable scattered subcentimeter hypodensities are noted in the liver, statistically most likely representing cysts or hemangiomas. No biliary ductal dilatation. The gallbladder is without stones. Pancreas: Unremarkable. No pancreatic ductal dilatation or surrounding inflammatory changes. Spleen: Normal in size without focal abnormality. Adrenals/Urinary Tract: The right adrenal gland is within normal limits. There is a 9 mm nodule in the left adrenal gland. No additional imaging is required at this time. There are subcentimeter hypodensities in the kidneys, possible cysts. An indeterminate exophytic lesion is noted in the left kidney measuring 1.3 cm. Renal calculi are present bilaterally. No obstructive uropathy is seen. The bladder is unremarkable. Stomach/Bowel: The stomach is within normal limits. No bowel obstruction, free air, or pneumatosis is seen. A moderate amount of retained stool is present in the colon. Appendix is surgically absent. Vascular/Lymphatic: Aortic atherosclerosis. No enlarged abdominal or pelvic lymph nodes. Reproductive: Status post hysterectomy. No adnexal masses. Other: No abdominopelvic ascites. A fat containing umbilical hernia is noted. There are bilateral breast implants. Musculoskeletal: Degenerative changes are present in the thoracolumbar spine. No acute osseous abnormality. IMPRESSION: 1. No acute intra-abdominal process. 2. Bilateral nephrolithiasis without evidence of obstruction. 3. Complex lesion in in the lower pole of the left kidney with indeterminate imaging characteristics. Ultrasound or MRI is recommended for further characterization on follow-up. 4. Moderate amount of retained stool in the colon  suggesting constipation. 5. Aortic atherosclerosis. Electronically Signed   By: Leita Birmingham M.D.   On: 06/21/2024 18:33    IMPRESSION:  1) Nausea/Vomiting 2) Recurrent  Esophageal Candidiasis 3) Esophageal stenosis From Dr. Rennis noted:  She has recurrent nausea/vomiting which is likely multifactorial, partly related to underlying chronic medical conditions, polypharmacy, but also a component of proximal esophageal stricture and recurrent candidiasis playing a role.  Last upper endoscopy did show resolution of the candidiasis but biopsies with increased esophageal lymphocytes. 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 reflux, possibly recurrent infection, immunosuppression?).  There was concern that since she vomited shortly after ingesting her pills.  Was wondering if her Addison's is suboptimally controlled due to unreliable steroid dosing.  They discussed possible PEG tube placement and referral was put in for discussion with IR.  4) Addison's Disease - Will need stress dose steroids for endoscopic procedure 5) Chronic hypoxic respiratory failure on home O2 6) CHF 7) CKD 3  8) Anxiety/depression - Follows in the [facility]    9) Hypokalemia: Potassium was 2.6 on presentation, up to 3.2 today. 10) AKI likely due to dehydration with poor p.o. intake 11) Elevated LFTs: ALT up to 136, 119 today and AST up to 79, 74 today.  This is new compared to August as they were normal.  Total bili and alk phos normal.  Noncontrast CT scan shows no biliary abnormalities.  Does show stable scattered subcentimeter hypodensities within the liver statistically most likely representing cyst or hemangiomas.  Will likely plan to monitor for now. 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  Will plan for 9/23. - Will need stress dose steroids, is on hydrocortisone  100 mg IV every 8 hours here so that should be adequate. -Pantoprazole  40 mg IV BID for now. -Will hold SQ heparin  after dose late tonight.  Harlene BIRCH. Zehr  06/22/2024, 9:34 AM    Attending physician's note   I have taken history, reviewed the chart and examined the patient. I performed a substantive portion of this encounter, including complete performance of at least one of the key components, in conjunction with the APP. I agree with the Advanced Practitioner's note, impression and recommendations.   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  N/V/failure to thrive/recurrent dehydration with significant component of oropharyngeal dysphagia on MBS.  No aspiration-being considered for PEG as outpt (for feeds/fluids/meds). Ned CT AP without contrast yesterday except for constipation.  Multiple comorbidities including Addison's disease (on IV hydrocortisone ), chronic respiratory failure on home O2, CHF, CKD3, anxiety/depression.  Plan: -EGD with dil/Bx in AM.  If+ candidiasis, then we will send Bx for fungal C/S. -IV Protonix  -Agree with previous assessment by Dr. San that she would require PEG for feeds/meds.  Will ask for IR consultation tomorrow. -She had appointment with ID today.  Appreciate ID agreeing to see her as inpatient tomorrow -After EGD, MiraLAX for constipation.  I have discussed risks and benefits of EGD in detail.  She agrees to proceed.   Anselm Bring, MD [facility] [phone]

## 2024-06-22 NOTE — Telephone Encounter (Signed)
 Returned call to patient. Advised patient to give me a call or feel free to respond to MyChart message.

## 2024-06-22 NOTE — Progress Notes (Signed)
 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 In the emergency room hemodynamically stable.  Hemoglobin 12.2.  Lipase 62.  Potassium 2.6 and magnesium  1.9.  Chest x-ray with airspace disease in the retrocardiac region on the left, atelectasis.  CT angiogram with no acute intra-abdominal process.  Bilateral nephrolithiasis without evidence of obstruction.  Complex lesion in the lower pole of the left kidney with indeterminate imaging characters.  Patient was given Solu-Cortef , lorazepam , potassium and Benadryl  and admitted to the hospital due to significant symptoms.  Patient stated not able to eat or drink for a long time as well unable to have meaningful water  intake.  Subjective: Patient seen in the morning rounds.  Tearful and emotional.  She tells me she is doing everything that she is supposed to do but she is not able to eat enough.  Gets dizzy and lightheaded and tends to fall.  Case discussed with GI.  Assessment & Plan:   Intractable nausea and vomiting: History of esophageal stricture and candidiasis.  Could not complete Diflucan  therapy. Continue maintenance IV fluids with potassium, using Benadryl  and Ativan  for nausea as she is allergic to other nausea medications.  Replace electrolytes.  Possibility inpatient  upper GI endoscopy tomorrow.  Currently on Protonix .  Addison's disease: Currently no evidence of exacerbation.  Patient on Solu-Cortef  at home.  Currently remains on a stress dose of steroids.  Anxiety and depression: Significant symptoms.  Patient on BuSpar, Lamictal , Klonopin .  Continue this.  She also takes Topamax  for prevention of migraine.  Hypokalemia: Unable to take oral medications.  Keeping IV potassium.  Recheck and monitor levels.    DVT prophylaxis: heparin  injection 5,000 Units Start: 06/21/24 2230   Code Status: Full code Family Communication: None at the bedside Disposition Plan: Status is: Inpatient Remains inpatient appropriate because: Significant symptoms, unable to eat     Consultants:  Gastroenterology  Procedures:  None  Antimicrobials:  None     Objective: Vitals:   06/21/24 2129 06/22/24 0038 06/22/24 0445 06/22/24 0836  BP:  137/82 111/74 121/77  Pulse:  88 83 83  Resp:  14 14   Temp:  (!) 97.5 F (36.4 C) (!) 97.3 F (36.3 C) 97.8 F (36.6 C)  TempSrc:  Oral Oral Oral  SpO2: 100% 100% 100% 100%  Weight:      Height:        Intake/Output Summary (Last 24 hours) at 06/22/2024 1326 Last data filed at 06/22/2024 0953 Gross per 24 hour  Intake 1086.59 ml  Output --  Net 1086.59 ml   Filed Weights   06/21/24 1955 06/21/24 2103  Weight: 75.2 kg 75.2 kg    Examination:  General exam: Appears calm but anxious.  Emotional and tearful. Respiratory system: Clear to auscultation. Respiratory effort normal. Cardiovascular system: S1 & S2 heard, RRR. No JVD, murmurs, rubs, gallops or clicks. No pedal edema. Gastrointestinal  system: Abdomen is nondistended, soft and nontender. No organomegaly or masses felt. Normal bowel sounds heard. Central nervous system: Alert and oriented. No focal neurological deficits. Extremities: Symmetric 5 x 5 power. Skin: Patient has healing wound right dorsum of the foot with eschar.  Picture in the  chart.    Data Reviewed: I have personally reviewed following labs and imaging studies  CBC: Recent Labs  Lab 06/21/24 1701 06/21/24 2200 06/22/24 0331  WBC 7.2 5.7 5.1  NEUTROABS 3.9  --   --   HGB 12.2 12.0 10.5*  HCT 38.5 42.2 34.0*  MCV 76.5* 82.4 78.7*  PLT 271 222 209   Basic Metabolic Panel: Recent Labs  Lab 06/21/24 1701 06/21/24 2200 06/22/24 0331  NA 139 137 140  K 2.6* 3.6 3.2*  CL 98 103 105  CO2 23 17* 21*  GLUCOSE 129* 110* 164*  BUN 27* 26* 20  CREATININE 1.93* 1.56* 1.43*  CALCIUM  9.6 8.3* 8.1*  MG 1.9 2.2  --   PHOS  --  2.8  --    GFR: Estimated Creatinine Clearance: 45.2 mL/min (A) (by C-G formula based on SCr of 1.43 mg/dL (H)). Liver Function Tests: Recent Labs  Lab 06/21/24 1701 06/22/24 0331  AST 79* 74*  ALT 136* 119*  ALKPHOS 99 79  BILITOT 0.4 <0.2  PROT 8.5* 6.9  ALBUMIN 4.8 4.0   Recent Labs  Lab 06/21/24 1701  LIPASE 62*   No results for input(s): AMMONIA in the last 168 hours. Coagulation Profile: No results for input(s): INR, PROTIME in the last 168 hours. Cardiac Enzymes: No results for input(s): CKTOTAL, CKMB, CKMBINDEX, TROPONINI in the last 168 hours. BNP (last 3 results) No results for input(s): PROBNP in the last 8760 hours. HbA1C: No results for input(s): HGBA1C in the last 72 hours. CBG: No results for input(s): GLUCAP in the last 168 hours. Lipid Profile: No results for input(s): CHOL, HDL, LDLCALC, TRIG, CHOLHDL, LDLDIRECT in the last 72 hours. Thyroid  Function Tests: No results for input(s): TSH, T4TOTAL, FREET4, T3FREE, THYROIDAB in the last 72 hours. Anemia Panel: No results for input(s): VITAMINB12, FOLATE, FERRITIN, TIBC, IRON, RETICCTPCT in the last 72 hours. Sepsis Labs: Recent Labs  Lab 06/21/24 2200 06/22/24 0330  PROCALCITON <0.10  --   LATICACIDVEN  --  0.8    No results found for this or any previous visit (from the past 240  hours).       Radiology Studies: DG Chest 2 View Result Date: 06/21/2024 CLINICAL DATA:  Nausea and vomiting. EXAM: CHEST - 2 VIEW COMPARISON:  05/18/2024. FINDINGS: The heart size and mediastinal contours are within normal limits. There is atherosclerotic calcification of the aorta. Focal airspace disease is noted in the retrocardiac region on the left. No effusion or pneumothorax is seen. A left PICC line appear stable. Surgical clips are noted in the chest walls bilaterally. No acute osseous abnormality. IMPRESSION: Airspace disease in the retrocardiac region on the left, possible residual pneumonia. Electronically Signed   By: Leita Birmingham M.D.   On: 06/21/2024 18:36   CT ABDOMEN PELVIS WO CONTRAST Result Date: 06/21/2024 CLINICAL DATA:  Left flank pain with vomiting. EXAM: CT ABDOMEN AND PELVIS WITHOUT CONTRAST TECHNIQUE: Multidetector CT imaging of the abdomen and pelvis was performed following the standard protocol without IV contrast. RADIATION DOSE REDUCTION: This exam was performed according to the departmental dose-optimization program which includes automated exposure control, adjustment of the mA and/or kV according to patient size and/or use of iterative reconstruction  technique. COMPARISON:  01/23/2023. FINDINGS: Lower chest: Atelectasis is noted at the lung bases. Hepatobiliary: Stable scattered subcentimeter hypodensities are noted in the liver, statistically most likely representing cysts or hemangiomas. No biliary ductal dilatation. The gallbladder is without stones. Pancreas: Unremarkable. No pancreatic ductal dilatation or surrounding inflammatory changes. Spleen: Normal in size without focal abnormality. Adrenals/Urinary Tract: The right adrenal gland is within normal limits. There is a 9 mm nodule in the left adrenal gland. No additional imaging is required at this time. There are subcentimeter hypodensities in the kidneys, possible cysts. An indeterminate exophytic lesion is noted  in the left kidney measuring 1.3 cm. Renal calculi are present bilaterally. No obstructive uropathy is seen. The bladder is unremarkable. Stomach/Bowel: The stomach is within normal limits. No bowel obstruction, free air, or pneumatosis is seen. A moderate amount of retained stool is present in the colon. Appendix is surgically absent. Vascular/Lymphatic: Aortic atherosclerosis. No enlarged abdominal or pelvic lymph nodes. Reproductive: Status post hysterectomy. No adnexal masses. Other: No abdominopelvic ascites. A fat containing umbilical hernia is noted. There are bilateral breast implants. Musculoskeletal: Degenerative changes are present in the thoracolumbar spine. No acute osseous abnormality. IMPRESSION: 1. No acute intra-abdominal process. 2. Bilateral nephrolithiasis without evidence of obstruction. 3. Complex lesion in in the lower pole of the left kidney with indeterminate imaging characteristics. Ultrasound or MRI is recommended for further characterization on follow-up. 4. Moderate amount of retained stool in the colon suggesting constipation. 5. Aortic atherosclerosis. Electronically Signed   By: Leita Birmingham M.D.   On: 06/21/2024 18:33        Scheduled Meds:  aspirin  EC  81 mg Oral Daily   buPROPion   150 mg Oral Daily   Chlorhexidine  Gluconate Cloth  6 each Topical Daily   feeding supplement  1 Container Oral TID BM   heparin   5,000 Units Subcutaneous Q8H   hydrocortisone  sod succinate (SOLU-CORTEF ) inj  100 mg Intravenous Q8H   lamoTRIgine   200 mg Oral QHS   [START ON 06/23/2024] lamoTRIgine   50 mg Oral BH-q7a   levothyroxine   100 mcg Oral q morning   pantoprazole  (PROTONIX ) IV  40 mg Intravenous Q12H   rosuvastatin   10 mg Oral Daily   sodium chloride  flush  10-40 mL Intracatheter Q12H   topiramate   150 mg Oral QHS   Continuous Infusions:  sodium chloride      dextrose  5 % and 0.9 % NaCl with KCl 40 mEq/L       LOS: 1 day    Time spent: 55 minutes    Renato Applebaum,  MD [facility]

## 2024-06-22 NOTE — Telephone Encounter (Signed)
 Inbound call from patient calling in regurds to previous note. Patient requesting to speak with a nurse. Please advise.

## 2024-06-22 NOTE — Progress Notes (Cosign Needed Addendum)
 Referring Physician(s): Gupta,R  Supervising Physician: Hughes Simmonds  Patient Status:  [name] - In-pt  Chief Complaint: Dysphagia, odynophagia, nausea, vomiting, failure to thrive, lymphocytic esophagitis, esophageal stricture; referred for percutaneous gastrostomy tube placement   Subjective: Patient known to IR team from multiple venous access procedures in the past including port a caths and PICC lines.  She is a 53 year old female with past medical history significant for chronic lymphedema, hx cushings secondary to pituitary adenoma s/p resection and gamma knife complicated by adrenal insufficiency/Addison's, history of Hodgkin's at age 33 s/p chemoradiation, breast cancer s/p chemotherapy and bilateral mastectomy in 2000 which is in remission, cervical cancer s/p LEEP 2002, ovarian tumor precancerous s/p TH and left oophorectomy in 2011 and right in 2016, hx postablative hypothyroidism, ADHD, anxiety/depression and chronic hypoxemic respiratory failure secondary to unknown etiology (on 5L home O2), GERD, CHF,CKD, anxiety/depression, history of DVTs.  She was recently admitted to the hospital with persistent nausea, vomiting, dysphagia/odynophagia, recurrent esophageal candidiasis, esophageal stricture, failure to thrive.  Request now received for percutaneous gastrostomy tube placement.  Patient scheduled for EGD on 9/23.   Past Medical History:  Diagnosis Date   Addison's disease [facility])    Adrenal insufficiency    Anemia    Anxiety    Aortic stenosis    Aortic stenosis    Appendicitis 12/19/2009   Appendicitis    Breast cancer (HCC)    STATUS POST BILATERAL MASTECTOMY. STATUS POST RECONSTRUCTION. SHE HAD SILICONE BREAST IMPLANTS AND THE LEFT IMPLANT IS LEAKING SLIGHTLY   Cellulitis of right middle finger 11/07/2018   Cervical cancer (HCC) 12/23/2018   Chest pain    CHF with right heart failure (HCC) 04/17/2017   Chronic respiratory failure with hypoxia (HCC) 12/23/2018    CKD (chronic kidney disease) stage 3, GFR 30-59 ml/min (HCC)    Cough variant asthma 04/13/2019   Depression    Functional neurological symptom disorder with mixed symptoms 2023   GERD (gastroesophageal reflux disease)    takes Dexilant  and carafate  and gi coctail    Headache    migraines on a daily and monthly regimen    Heart murmur    History of kidney stones    Hodgkin lymphoma (HCC)    STATUS POST MANTLE RADIATION   Hodgkin's lymphoma (HCC)    1987   Hypertension    Hypoxia    Multiple lung nodules    bilateral   Necrotizing fasciitis (HCC) 12/23/2018   Non-ischemic cardiomyopathy (HCC)    Osteoporosis    Ovarian tumor (benign)    bilateral   Palpitations    Pituitary adenoma (HCC) 12/23/2018   Pneumonia    PONV (postoperative nausea and vomiting)    Pre-diabetes    per pt; no meds   Pulmonary hypertension (HCC) 12/23/2018   Raynaud phenomenon    Right heart failure (HCC) 04/17/2017   Seizures (HCC)    last febrile seizure was approx 3 weeks ago per report on 12/01/2020   Supplemental oxygen  dependent    3 liters   SVT (supraventricular tachycardia)    Tachycardia    Thyroid  cancer (HCC)    STATUS POST SURGICAL REMOVAL-CURRENT ON THYROID  REPLACEMENT   Past Surgical History:  Procedure Laterality Date   ABDOMINAL HYSTERECTOMY     AMPUTATION Left 01/30/2019   Procedure: Left Index finger amputation with flap reconstruction and repair reconstruction;  Surgeon: Camella Fallow, MD;  Location: [facility] OR;  Service: Orthopedics;  Laterality: Left;   APPENDECTOMY     BIOPSY OF  SKIN SUBCUTANEOUS TISSUE AND/OR MUCOUS MEMBRANE  12/26/2023   Procedure: BIOPSY, SKIN, SUBCUTANEOUS TISSUE, OR MUCOUS MEMBRANE;  Surgeon: Leigh Elspeth SQUIBB, MD;  Location: [facility];  Service: Gastroenterology;;   breast implants and removal      breast implants but leaking      CARDIAC CATHETERIZATION  05/18/2009   NORMAL CATH   CESAREAN SECTION  2007   and 1997   COLONOSCOPY     ESOPHAGEAL  DILATION  02/11/2024   Procedure: DILATION, ESOPHAGUS;  Surgeon: San Sandor GAILS, DO;  Location: [facility];  Service: Gastroenterology;;   ESOPHAGOGASTRODUODENOSCOPY N/A 12/26/2023   Procedure: EGD (ESOPHAGOGASTRODUODENOSCOPY);  Surgeon: Leigh Elspeth SQUIBB, MD;  Location: [facility];  Service: Gastroenterology;  Laterality: N/A;   ESOPHAGOGASTRODUODENOSCOPY N/A 02/11/2024   Procedure: EGD (ESOPHAGOGASTRODUODENOSCOPY);  Surgeon: San Sandor GAILS, DO;  Location: [facility];  Service: Gastroenterology;  Laterality: N/A;   ESOPHAGOGASTRODUODENOSCOPY N/A 04/13/2024   Procedure: EGD (ESOPHAGOGASTRODUODENOSCOPY);  Surgeon: San Sandor GAILS, DO;  Location: [facility];  Service: Gastroenterology;  Laterality: N/A;   HOT HEMOSTASIS N/A 12/26/2023   Procedure: EGD, WITH ARGON PLASMA COAGULATION;  Surgeon: Leigh Elspeth SQUIBB, MD;  Location: [facility];  Service: Gastroenterology;  Laterality: N/A;   hx of chemotherapy      hx of radiation therapy      I & D EXTREMITY Left 12/23/2018   Procedure: IRRIGATION AND DEBRIDEMENT HAND / INDEX FINGER;  Surgeon: Camella Fallow, MD;  Location: [facility];  Service: Orthopedics;  Laterality: Left;   IR CV LINE INJECTION  03/22/2022   IR FLUORO GUIDE CV LINE RIGHT  10/09/2022   IR FLUORO GUIDE CV LINE RIGHT  01/18/2023   IR FLUORO GUIDE CV LINE RIGHT  03/26/2023   IR FLUORO GUIDE CV LINE RIGHT  10/10/2023   IR FLUORO GUIDE CV LINE RIGHT  01/06/2024   IR IMAGING GUIDED PORT INSERTION  05/06/2020   IR IMAGING GUIDED PORT INSERTION  12/04/2021   IR IMAGING GUIDED PORT INSERTION  02/25/2024   IR PATIENT EVAL TECH 0-60 MINS  03/12/2024   IR RADIOLOGIST EVAL & MGMT  03/12/2024   IR RADIOLOGIST EVAL & MGMT  03/31/2024   IR REMOVAL TUN ACCESS W/ PORT W/O FL MOD SED  04/27/2020   IR REMOVAL TUN ACCESS W/ PORT W/O FL MOD SED  12/04/2021   IR REMOVAL TUN ACCESS W/ PORT W/O FL MOD SED  03/30/2022   IR REMOVAL TUN ACCESS W/ PORT W/O FL MOD SED  02/27/2024   IR REMOVAL  TUN CV CATH W/O FL  02/25/2024   IR US  GUIDE VASC ACCESS RIGHT  10/09/2022   KIDNEY STONE SURGERY     LUMBAR PUNCTURE W/ INTRATHECAL CHEMOTHERAPY     MASTECTOMY     PITUITARY SURGERY     RIGHT/LEFT HEART CATH AND CORONARY ANGIOGRAPHY N/A 04/02/2018   Procedure: RIGHT/LEFT HEART CATH AND CORONARY ANGIOGRAPHY;  Surgeon: Verlin Lonni BIRCH, MD;  [facility]: [facility];  Service: Cardiovascular;  Laterality: N/A;   RIGHT/LEFT HEART CATH AND CORONARY ANGIOGRAPHY N/A 08/31/2022   Procedure: RIGHT/LEFT HEART CATH AND CORONARY ANGIOGRAPHY;  Surgeon: Cherrie Toribio SAUNDERS, MD;  Location: [facility];  Service: Cardiovascular;  Laterality: N/A;   TOOTH EXTRACTION N/A 12/05/2020   Procedure: DENTAL RESTORATION/EXTRACTIONS;  Surgeon: Arvil Lonni RAMAN, MD;  Location: [facility];  Service: Oral Surgery;  Laterality: N/A;   TOTAL THYROIDECTOMY     VIDEO BRONCHOSCOPY Bilateral 11/14/2018   Procedure: VIDEO BRONCHOSCOPY WITHOUT FLUORO;  Surgeon: Kassie, Chi  Slater, MD;  Location: [facility] END[facility]COPY;  Service: Cardiopulmonary;  Laterality: Bilateral;   VIDEO BRONCHOSCOPY WITH ENDOBRONCHIAL ULTRASOUND N/A 11/19/2018   Procedure: VIDEO BRONCHOSCOPY WITH ENDOBRONCHIAL ULTRASOUND;  Surgeon: Kassie Acquanetta Slater, MD;  Location: [facility] OR;  Service: Thoracic;  Laterality: N/A;     Allergies: Ferrous bisglycinate chelate [iron], Mushroom extract complex (obsolete), Na ferric gluc cplx in sucrose, Tape, Cymbalta [duloxetine hcl], Hydromorphone , Morphine, Ondansetron  hcl, Promethazine, Succinylcholine, Buprenorphine hcl, Compazine, Duloxetine, Metoclopramide, Morphine and codeine, Ondansetron , Promethazine hcl, and Silver   Medications: Prior to Admission medications   Medication Sig Start Date End Date Taking? Authorizing Provider  albuterol  (PROVENTIL ) (2.5 MG/3ML) 0.083% nebulizer solution INHALE 3 ML BY NEBULIZATION EVERY 6 HOURS AS NEEDED FOR WHEEZING OR SHORTNESS OF BREATH Patient taking differently: Take  2.5 mg by nebulization every 6 (six) hours as needed for wheezing or shortness of breath. 12/13/23  Yes Kassie Acquanetta Slater, MD  albuterol  (VENTOLIN  HFA) 108 (90 Base) MCG/ACT inhaler Inhale 2 puffs into the lungs every 6 (six) hours as needed for wheezing or shortness of breath. 01/08/24  Yes Kassie Acquanetta Slater, MD  aspirin  EC 81 MG tablet Take 1 tablet (81 mg total) by mouth daily. Swallow whole. Patient taking differently: Take 81 mg by mouth daily. Chewable 07/16/22  Yes Nahser, Aleene PARAS, MD  bumetanide  (BUMEX ) 1 MG tablet Take 1 tablet (1 mg total) by mouth 2 (two) times daily. 03/11/24  Yes Nahser, Aleene PARAS, MD  buPROPion  (WELLBUTRIN  XL) 150 MG 24 hr tablet Take 1 tablet (150 mg total) by mouth daily. 05/25/24  Yes Mozingo, Regina Nattalie, NP  cyclobenzaprine  (FLEXERIL ) 10 MG tablet Take 1 tablet (10 mg total) by mouth 2 (two) times daily as needed for muscle spasms. 05/29/24  Yes Leath-Warren, Etta PARAS, NP  dexlansoprazole  (DEXILANT ) 60 MG capsule Take 1 capsule (60 mg total) by mouth daily. 04/13/24  Yes Cirigliano, Vito V, DO  diclofenac  (VOLTAREN ) 50 MG EC tablet Take 50 mg by mouth 2 (two) times daily as needed (can repeat if needed). 05/29/24  Yes [provider]  diphenhydrAMINE  (BENADRYL ) 50 MG/ML injection Inject 25 mg into the vein 5 (five) times daily as needed (for nausea  (is 50 mg/ml)).   Yes [provider]  EPINEPHRINE  0.3 mg/0.3 mL IJ SOAJ injection INJECT 0.3 MG INTO THE MUSCLE AS NEEDED FOR ANAPHYLAXIS. 04/25/23  Yes Kassie Acquanetta Slater, MD  famotidine  (PEPCID ) 40 MG/5ML suspension Take 2.5 mLs (20 mg total) by mouth daily. Patient taking differently: Take 20 mg by mouth daily as needed for heartburn or indigestion. 04/13/24 06/22/24 Yes Cirigliano, Vito V, DO  FLORASTOR 250 MG capsule Take 250 mg by mouth 2 (two) times daily.   Yes [provider]  FLUoxetine  (PROZAC ) 40 MG capsule Take 1 capsule (40 mg total) by mouth daily. 05/25/24  Yes Mozingo, Regina  Nattalie, NP  fluticasone -salmeterol (ADVAIR HFA) 230-21 MCG/ACT inhaler Inhale 2 puffs into the lungs 2 (two) times daily. Patient taking differently: Inhale 2 puffs into the lungs 2 (two) times daily as needed (for flares). 01/08/24  Yes Kassie Acquanetta Slater, MD  Galcanezumab -gnlm (EMGALITY  [place]) Inject into the skin every 30 (thirty) days.   Yes [provider]  guaiFENesin  (ROBITUSSIN) 100 MG/5ML liquid Take 10 mLs by mouth every 6 (six) hours as needed for cough or to loosen phlegm. 05/22/24  Yes Von Bellis, MD  Heparin  Na, Pork, Lock Flsh PF (BD HEPARIN  POSIFLUSH) 100 UNIT/ML SOLN USE 5 MLS IN PORT A CATH ONCE DAILY AFTER  MEDICATION ADMINISTRATION AS A HEPLOCK AS DIRECTED. 04/01/24  Yes   hydrocortisone  (CORTEF ) 10 MG tablet Take 1-2 tablets (10-20 mg total) by mouth See admin instructions. Take 20 mg in the am and 10mg  in the evening. Take after completing prednisone  Patient taking differently: Take 10-20 mg by mouth See admin instructions. Take 20 mg by mouth in the morning and 10 mg in the afternoon. 09/29/22  Yes Tobie Yetta HERO, MD  hydrOXYzine  (ATARAX ) 10 MG/5ML syrup Take 12.5 mLs (25 mg total) by mouth 3 (three) times daily. 05/22/24  Yes Von Bellis, MD  hyoscyamine  (LEVSIN  SL) 0.125 MG SL tablet Place 1 tablet (0.125 mg total) under the tongue every 6 (six) hours as needed for cramping (esophageal spasm). Up to 1.25 mg daily 05/22/24  Yes Von Bellis, MD  ipratropium-albuterol  (DUONEB) 0.5-2.5 (3) MG/3ML SOLN Take 3 mLs by nebulization every 6 (six) hours as needed (for shortness of breath or wheezing).   Yes [provider]  lamoTRIgine  (LAMICTAL ) 200 MG tablet TAKE 1 TABLET BY MOUTH EVERYDAY AT BEDTIME Patient taking differently: Take 200 mg by mouth at bedtime. TAKE 1 TABLET BY MOUTH EVERYDAY AT BEDTIME 05/25/24  Yes Mozingo, Regina Nattalie, NP  lamoTRIgine  (LAMICTAL ) 25 MG tablet Take two tablets every morning. Patient taking differently: Take 50 mg by mouth every  morning. Take two tablets every morning. 05/25/24  Yes Mozingo, Regina Nattalie, NP  lidocaine  (LIDODERM ) 5 % Place 1 patch onto the skin daily. Remove & Discard patch within 12 hours or as directed by MD 05/29/24  Yes Leath-Warren, Etta PARAS, NP  lidocaine  (XYLOCAINE ) 2 % solution Use as directed 10 mLs in the mouth or throat every 4 (four) hours as needed (for esophageal pain and pain with swallowing.). 12/22/23  Yes Harris, Abigail, PA-C  nitroGLYCERIN  (NITROSTAT ) 0.4 MG SL tablet DISSOLVE 1 TAB UNDER THE TONGUE EVERY 5 MINUTES AS NEEDED FOR CHEST PAIN. MAX OF 3 DOSES, THEN 911. Patient taking differently: Place 0.4 mg under the tongue every 5 (five) minutes as needed for chest pain. DISSOLVE 1 TAB UNDER THE TONGUE EVERY 5 MINUTES AS NEEDED FOR CHEST PAIN. MAX OF 3 DOSES, THEN 911. 07/03/23  Yes Nahser, Aleene PARAS, MD  NURTEC 75 MG TBDP Take 75 mg by mouth daily as needed (migraines). 09/03/23  Yes [provider]  OLANZapine  (ZYPREXA ) 5 MG tablet Take 1 tablet (5 mg total) by mouth at bedtime. 05/25/24  Yes Mozingo, Regina Nattalie, NP  potassium chloride  (KLOR-CON ) 20 MEQ packet Take 20 mEq by mouth daily. Can be mixed in the juice for better test 05/22/24 05/22/25 Yes Von Bellis, MD  QVAR  REDIHALER 80 MCG/ACT inhaler Inhale 2 puffs into the lungs daily. 05/10/23  Yes [provider]  rosuvastatin  (CRESTOR ) 10 MG tablet Take 10 mg by mouth in the morning. Mid morning 02/28/18  Yes [provider]  sacubitril -valsartan  (ENTRESTO ) 24-26 MG Take 1 tablet by mouth 2 (two) times daily.   Yes [provider]  sucralfate  (CARAFATE ) 1 GM/10ML suspension Take 1 g by mouth daily as needed (as directed for ulcers).   Yes [provider]  SYNTHROID  100 MCG tablet Take 1 tablet (100 mcg total) by mouth every morning. Patient taking differently: Take 100 mcg by mouth every morning.  Must be name brand* 10/19/21  Yes Gudena, Vinay, MD  Tiotropium Bromide  Monohydrate (SPIRIVA   RESPIMAT) 1.25 MCG/ACT AERS Inhale 2 puffs into the lungs daily. 01/08/24  Yes Kassie Acquanetta Bradley, MD  topiramate  (TOPAMAX ) 50 MG tablet  Take 150 mg by mouth at bedtime. 12/25/19  Yes [provider]  zolpidem  (AMBIEN  CR) 12.5 MG CR tablet TAKE ONE TABLET BY MOUTH AT BEDTIME AS NEEDED FOR SLEEP. Patient taking differently: Take 12.5 mg by mouth at bedtime. 05/14/23  Yes   Bismuth Tribromoph-Petrolatum (XEROFORM OCCLUSIVE GAUZE STRIP) PADS Apply 1 each topically as directed. 09/17/22   Tobie Yetta HERO, MD  OXYGEN  Inhale 3-5 L/min into the lungs continuous.    [provider]  sodium chloride  flush (BD POSIFLUSH) 0.9 % SOLN injection Use as directed for catheter maintenance 02/07/24     Sodium Chloride  Flush (NORMAL SALINE FLUSH) 0.9 % SOLN Use as directed for catheter maintenance. 02/07/24     Sodium Chloride  Flush (NORMAL SALINE FLUSH) 0.9 % SOLN Use 10mls as directed for catheter maintenance 05/29/24        Vital Signs: BP 112/67 (BP Location: Right Arm)   Pulse 89   Temp 97.7 F (36.5 C) (Oral)   Resp 16   Ht 5' 4 (1.626 m)   Wt 165 lb 12.6 oz (75.2 kg)   LMP  (LMP Unknown)   SpO2 100%   BMI 28.46 kg/m   Physical Exam: Patient awake, answering questions okay, chest clear to auscultation bilaterally.  Heart with regular rate and rhythm, positive murmur.  Abdomen soft, positive bowel sounds, nontender.  No significant lower extremity edema.  Imaging: DG Chest 2 View Result Date: 06/21/2024 CLINICAL DATA:  Nausea and vomiting. EXAM: CHEST - 2 VIEW COMPARISON:  05/18/2024. FINDINGS: The heart size and mediastinal contours are within normal limits. There is atherosclerotic calcification of the aorta. Focal airspace disease is noted in the retrocardiac region on the left. No effusion or pneumothorax is seen. A left PICC line appear stable. Surgical clips are noted in the chest walls bilaterally. No acute osseous abnormality. IMPRESSION: Airspace disease in the retrocardiac region on  the left, possible residual pneumonia. Electronically Signed   By: Leita Birmingham M.D.   On: 06/21/2024 18:36   CT ABDOMEN PELVIS WO CONTRAST Result Date: 06/21/2024 CLINICAL DATA:  Left flank pain with vomiting. EXAM: CT ABDOMEN AND PELVIS WITHOUT CONTRAST TECHNIQUE: Multidetector CT imaging of the abdomen and pelvis was performed following the standard protocol without IV contrast. RADIATION DOSE REDUCTION: This exam was performed according to the departmental dose-optimization program which includes automated exposure control, adjustment of the mA and/or kV according to patient size and/or use of iterative reconstruction technique. COMPARISON:  01/23/2023. FINDINGS: Lower chest: Atelectasis is noted at the lung bases. Hepatobiliary: Stable scattered subcentimeter hypodensities are noted in the liver, statistically most likely representing cysts or hemangiomas. No biliary ductal dilatation. The gallbladder is without stones. Pancreas: Unremarkable. No pancreatic ductal dilatation or surrounding inflammatory changes. Spleen: Normal in size without focal abnormality. Adrenals/Urinary Tract: The right adrenal gland is within normal limits. There is a 9 mm nodule in the left adrenal gland. No additional imaging is required at this time. There are subcentimeter hypodensities in the kidneys, possible cysts. An indeterminate exophytic lesion is noted in the left kidney measuring 1.3 cm. Renal calculi are present bilaterally. No obstructive uropathy is seen. The bladder is unremarkable. Stomach/Bowel: The stomach is within normal limits. No bowel obstruction, free air, or pneumatosis is seen. A moderate amount of retained stool is present in the colon. Appendix is surgically absent. Vascular/Lymphatic: Aortic atherosclerosis. No enlarged abdominal or pelvic lymph nodes. Reproductive: Status post hysterectomy. No adnexal masses. Other: No abdominopelvic ascites. A fat containing umbilical hernia is  noted. There are  bilateral breast implants. Musculoskeletal: Degenerative changes are present in the thoracolumbar spine. No acute osseous abnormality. IMPRESSION: 1. No acute intra-abdominal process. 2. Bilateral nephrolithiasis without evidence of obstruction. 3. Complex lesion in in the lower pole of the left kidney with indeterminate imaging characteristics. Ultrasound or MRI is recommended for further characterization on follow-up. 4. Moderate amount of retained stool in the colon suggesting constipation. 5. Aortic atherosclerosis. Electronically Signed   By: Leita Birmingham M.D.   On: 06/21/2024 18:33    Labs:  CBC: Recent Labs    05/22/24 0347 06/21/24 1701 06/21/24 2200 06/22/24 0331  WBC 10.1 7.2 5.7 5.1  HGB 9.6* 12.2 12.0 10.5*  HCT 32.9* 38.5 42.2 34.0*  PLT 415* 271 222 209    COAGS: No results for input(s): INR, APTT in the last 8760 hours.  BMP: Recent Labs    05/22/24 0347 06/21/24 1701 06/21/24 2200 06/22/24 0331  NA 135 139 137 140  K 3.8 2.6* 3.6 3.2*  CL 101 98 103 105  CO2 24 23 17* 21*  GLUCOSE 128* 129* 110* 164*  BUN 26* 27* 26* 20  CALCIUM  8.7* 9.6 8.3* 8.1*  CREATININE 0.89 1.93* 1.56* 1.43*  GFRNONAA >60 30* 39* 44*    LIVER FUNCTION TESTS: Recent Labs    03/25/24 2029 05/18/24 1405 05/19/24 0850 05/20/24 0500 06/21/24 1701 06/22/24 0331  BILITOT 0.4 0.6  --   --  0.4 <0.2  AST 28 19  --   --  79* 74*  ALT 33 19  --   --  136* 119*  ALKPHOS 58 50  --   --  99 79  PROT 7.5 6.9  --   --  8.5* 6.9  ALBUMIN 3.7 3.7 3.2* 3.1* 4.8 4.0    Assessment and Plan: 53 year old female with past medical history significant for chronic lymphedema, hx Cushings secondary to pituitary adenoma s/p resection and gamma knife complicated by adrenal insufficiency/Addison's, history of Hodgkin's at age 37 s/p chemoradiation, breast cancer s/p chemotherapy and bilateral mastectomy in 2000 which is in remission, cervical cancer s/p LEEP 2002, ovarian tumor precancerous s/p  TH and left oophorectomy in 2011 and right in 2016, hx postablative hypothyroidism, ADHD, anxiety/depression and chronic hypoxemic respiratory failure secondary to unknown etiology (on 5L home O2), GERD, CHF,CKD, anxiety/depression, history of DVTs.  She was recently admitted to the hospital with persistent nausea, vomiting, dysphagia/odynophagia, recurrent esophageal candidiasis, esophageal stricture, failure to thrive.  Request now received for percutaneous gastrostomy tube placement.  Patient scheduled for EGD on 9/23.  Latest imaging studies have been reviewed by Dr.Mugweru and anatomically patient would be candidate for percutaneous gastrostomy tube placement.Risks and benefits image guided gastrostomy tube placement was discussed with the patient including, but not limited to the need for a barium enema during the procedure, bleeding, infection, peritonitis and/or damage to adjacent structures.  All of the patient's questions were answered, patient is agreeable to proceed.  Consent signed and in chart.  Tentative plans are to proceed with gastrostomy tube placement on 9/24.  Hold aspirin  starting today; hold subcu heparin  day of procedure    Electronically Signed: D. Franky Rakers, PA-C 06/22/2024, 4:21 PM   I spent a total of 25 Minutes at the the patient's bedside AND on the patient's hospital floor or unit, greater than 50% of which was counseling/coordinating care for percutaneous gastrostomy tube placement    Patient ID: [name], female   DOB: May 16, 1971, 53 y.o.   MRN: [id number]

## 2024-06-22 NOTE — H&P (View-Only) (Signed)
 Referring Provider: Dr. Raenelle, TRH Primary Care Physician:  Toppin, Cy HERO, MD Primary Gastroenterologist:  Dr. San  Reason for Consultation:  N/V, dysphagia  HPI: [name] is a 53 y.o. female with past medical history of chronic lymphedema, hx cushings secondary to pituitary adenoma s/p resection and gamma knife complicated by adrenal insufficiency, history of Hodgkin's at age 89 s/p chemoradiation, breast cancer s/p chemotherapy and bilateral mastectomy in 2000 which is in remission, cervical cancer s/p LEEP 2002, ovarian tumor precancerous s/p TH and left oophorectomy in 2011 and right in 2016, hx postablative hypothyroidism, ADHD, anxiety/depression and chronic hypoxemic respiratory failure secondary to unknown etiology (on 5L home O2), history of DVTs.  She is on long-term steroids for her Addison's and has had multiple courses of antibiotics.  Currently on Dexilant  60 mg daily, famotidine  liquid at bedtime, along with liquid Carafate , GI cocktail on demand.   Just saw Dr. San on 06/03/2024 with plan for EGD with dilation (scheduled for November because needs to be done at [facility]).  Also scheduled for esophageal manometry in November.  Plan was to consult IR for possible PEG placement and ID for recurrent esophageal candidiasis.  Came to the hospital again for inability to take much by mouth.  Says that she does tolerate liquids, even ice cream and Protein shakes.  Meds to do stay down well though.  K+ low at 2.6, Cr up from baseline.  CT scan abdomen and pelvis without contrast 06/21/24:  IMPRESSION: 1. No acute intra-abdominal process. 2. Bilateral nephrolithiasis without evidence of obstruction. 3. Complex lesion in in the lower pole of the left kidney with indeterminate imaging characteristics. Ultrasound or MRI is recommended for further characterization on follow-up. 4. Moderate amount of retained stool in the colon suggesting constipation. 5. Aortic  atherosclerosis.    08/01/2020 colonoscopy, recall 10 years Impression Nonthrombosed external hemorrhoids found on perianal exam Internal hemorrhoids The examination was otherwise normal No specimens collected. 08/01/2020 EGD Findings Normal esophagus 3 cm hiatal hernia Multiple fundic gland polyps Normal examined duodenum No source of hematemesis is identified Biopsies of the greater curvature of the gastric body, lesser curvature of the gastric body, at the incisura, on the greater curvature of the gastric atrium and on the lesser curvature of the gastric atrium. Path stomach biopsy Atrial and oxyntic mucosa with no significant pathologic findings.  Negative for H. Pylori. 09/16/23 MBSS-  Clinical Impression:  Patient presents with a normal oropharyngeal swallow however she does present with a suspected primary esophageal dysphagia as per this modified barium swallow study. 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 Thicker barium consistencies (honey thick, pudding thick and mechanical soft solids did transit a little slowly through PES and upper esophagus but no retrograde movement or stasis of barium visualized in upper esophagus or at level of PES. Patient attempted to swallow barium tablet with plain water  but she gagged while tablet still in mouth and spit it out. She reports this is something that can occur without warning when she is eating or taking medications. No aspiration or penetration observed and oral and pharyngeal structures all appeared to be [facility]. SLP recommended patient see an outpatient SLP for help with reintroducing solid foods into her diet.  12/07/23 CT Soft tissue Neck WO contrast IMPRESSION: 1. No acute or focal lesion to explain the patient's symptoms. No radiopaque foreign body or soft tissue nodule to account for a medicine tablet or capsule in the neck. 2. Ill-defined  airspace opacities in the right upper lobe  raise concern for pneumonia.  12/26/2023: EGD: 2 cm hiatal hernia, severe esophageal candidiasis, suspected benign-appearing esophageal stenosis in the proximal esophagus not dilated due to severe candidiasis, benign fundic gland polyps, gastric AVM treated with APC 02/11/2024: EGD: Benign-appearing esophageal stenosis dilated with endoscope:, esophageal candidiasis (improved from previous), 2 cm hiatal hernia, fundic gland polyps 04/13/2024: EGD: Benign-appearing stenosis at 16 cm dilated with 16 mm Savary with appropriate mucosal disruption, subtle white plaques in the lower esophagus, 1-2 cm hiatal hernia, fundic gland polyps   Scheduled for esophageal manometry and pH/impedance study (on PPI) on 08/05/2024, but doubtful that she can tolerate those procedures.   Past Medical History:  Diagnosis Date   Addison's disease (HCC)    Adrenal insufficiency (HCC)    Anemia    Anxiety    Aortic stenosis    Aortic stenosis    Appendicitis 12/19/2009   Appendicitis    Breast cancer (HCC)    STATUS POST BILATERAL MASTECTOMY. STATUS POST RECONSTRUCTION. SHE HAD SILICONE BREAST IMPLANTS AND THE LEFT IMPLANT IS LEAKING SLIGHTLY   Cellulitis of right middle finger 11/07/2018   Cervical cancer (HCC) 12/23/2018   Chest pain    CHF with right heart failure (HCC) 04/17/2017   Chronic respiratory failure with hypoxia (HCC) 12/23/2018   CKD (chronic kidney disease) stage 3, GFR 30-59 ml/min (HCC)    Cough variant asthma 04/13/2019   Depression    Functional neurological symptom disorder with mixed symptoms 2023   GERD (gastroesophageal reflux disease)    takes Dexilant  and carafate  and gi coctail    Headache    migraines on a daily and monthly regimen    Heart murmur    History of kidney stones    Hodgkin lymphoma (HCC)    STATUS POST MANTLE RADIATION   Hodgkin's lymphoma (HCC)    1987   Hypertension    Hypoxia    Multiple lung nodules    bilateral   Necrotizing fasciitis (HCC) 12/23/2018    Non-ischemic cardiomyopathy (HCC)    Osteoporosis    Ovarian tumor (benign)    bilateral   Palpitations    Pituitary adenoma (HCC) 12/23/2018   Pneumonia    PONV (postoperative nausea and vomiting)    Pre-diabetes    per pt; no meds   Pulmonary hypertension (HCC) 12/23/2018   Raynaud phenomenon    Right heart failure (HCC) 04/17/2017   Seizures (HCC)    last febrile seizure was approx 3 weeks ago per report on 12/01/2020   Supplemental oxygen  dependent    3 liters   SVT (supraventricular tachycardia) (HCC)    Tachycardia    Thyroid  cancer (HCC)    STATUS POST SURGICAL REMOVAL-CURRENT ON THYROID  REPLACEMENT    Past Surgical History:  Procedure Laterality Date   ABDOMINAL HYSTERECTOMY     AMPUTATION Left 01/30/2019   Procedure: Left Index finger amputation with flap reconstruction and repair reconstruction;  Surgeon: Camella Fallow, MD;  Location: [facility] OR;  Service: Orthopedics;  Laterality: Left;   APPENDECTOMY     BIOPSY OF SKIN SUBCUTANEOUS TISSUE AND/OR MUCOUS MEMBRANE  12/26/2023   Procedure: BIOPSY, SKIN, SUBCUTANEOUS TISSUE, OR MUCOUS MEMBRANE;  Surgeon: Leigh Elspeth SQUIBB, MD;  Location: [facility];  Service: Gastroenterology;;   breast implants and removal      breast implants but leaking      CARDIAC CATHETERIZATION  05/18/2009   NORMAL CATH   CESAREAN SECTION  2007   and 1997  COLONOSCOPY     ESOPHAGEAL DILATION  02/11/2024   Procedure: DILATION, ESOPHAGUS;  Surgeon: San Sandor GAILS, DO;  Location: [facility];  Service: Gastroenterology;;   ESOPHAGOGASTRODUODENOSCOPY N/A 12/26/2023   Procedure: EGD (ESOPHAGOGASTRODUODENOSCOPY);  Surgeon: Leigh Elspeth SQUIBB, MD;  Location: [facility];  Service: Gastroenterology;  Laterality: N/A;   ESOPHAGOGASTRODUODENOSCOPY N/A 02/11/2024   Procedure: EGD (ESOPHAGOGASTRODUODENOSCOPY);  Surgeon: San Sandor GAILS, DO;  [facility]: [facility];  Service: Gastroenterology;  Laterality: N/A;   ESOPHAGOGASTRODUODENOSCOPY N/A  04/13/2024   Procedure: EGD (ESOPHAGOGASTRODUODENOSCOPY);  Surgeon: San Sandor GAILS, DO;  Location: [facility];  Service: Gastroenterology;  Laterality: N/A;   HOT HEMOSTASIS N/A 12/26/2023   Procedure: EGD, WITH ARGON PLASMA COAGULATION;  Surgeon: Leigh Elspeth SQUIBB, MD;  Location: [facility];  Service: Gastroenterology;  Laterality: N/A;   hx of chemotherapy      hx of radiation therapy      I & D EXTREMITY Left 12/23/2018   Procedure: IRRIGATION AND DEBRIDEMENT HAND / INDEX FINGER;  Surgeon: Camella Fallow, MD;  Location: [facility];  Service: Orthopedics;  Laterality: Left;   IR CV LINE INJECTION  03/22/2022   IR FLUORO GUIDE CV LINE RIGHT  10/09/2022   IR FLUORO GUIDE CV LINE RIGHT  01/18/2023   IR FLUORO GUIDE CV LINE RIGHT  03/26/2023   IR FLUORO GUIDE CV LINE RIGHT  10/10/2023   IR FLUORO GUIDE CV LINE RIGHT  01/06/2024   IR IMAGING GUIDED PORT INSERTION  05/06/2020   IR IMAGING GUIDED PORT INSERTION  12/04/2021   IR IMAGING GUIDED PORT INSERTION  02/25/2024   IR PATIENT EVAL TECH 0-60 MINS  03/12/2024   IR RADIOLOGIST EVAL & MGMT  03/12/2024   IR RADIOLOGIST EVAL & MGMT  03/31/2024   IR REMOVAL TUN ACCESS W/ PORT W/O FL MOD SED  04/27/2020   IR REMOVAL TUN ACCESS W/ PORT W/O FL MOD SED  12/04/2021   IR REMOVAL TUN ACCESS W/ PORT W/O FL MOD SED  03/30/2022   IR REMOVAL TUN ACCESS W/ PORT W/O FL MOD SED  02/27/2024   IR REMOVAL TUN CV CATH W/O FL  02/25/2024   IR US  GUIDE VASC ACCESS RIGHT  10/09/2022   KIDNEY STONE SURGERY     LUMBAR PUNCTURE W/ INTRATHECAL CHEMOTHERAPY     MASTECTOMY     PITUITARY SURGERY     RIGHT/LEFT HEART CATH AND CORONARY ANGIOGRAPHY N/A 04/02/2018   Procedure: RIGHT/LEFT HEART CATH AND CORONARY ANGIOGRAPHY;  Surgeon: Verlin Lonni BIRCH, MD;  [facility]: [facility];  Service: Cardiovascular;  Laterality: N/A;   RIGHT/LEFT HEART CATH AND CORONARY ANGIOGRAPHY N/A 08/31/2022   Procedure: RIGHT/LEFT HEART CATH AND CORONARY ANGIOGRAPHY;  Surgeon:  Cherrie Toribio SAUNDERS, MD;  Location: [facility];  Service: Cardiovascular;  Laterality: N/A;   TOOTH EXTRACTION N/A 12/05/2020   Procedure: DENTAL RESTORATION/EXTRACTIONS;  Surgeon: Arvil Lonni RAMAN, MD;  Location: [facility];  Service: Oral Surgery;  Laterality: N/A;   TOTAL THYROIDECTOMY     VIDEO BRONCHOSCOPY Bilateral 11/14/2018   Procedure: VIDEO BRONCHOSCOPY WITHOUT FLUORO;  Surgeon: Kassie Acquanetta Bradley, MD;  Location: [facility] ENDOSCOPY;  Service: Cardiopulmonary;  Laterality: Bilateral;   VIDEO BRONCHOSCOPY WITH ENDOBRONCHIAL ULTRASOUND N/A 11/19/2018   Procedure: VIDEO BRONCHOSCOPY WITH ENDOBRONCHIAL ULTRASOUND;  Surgeon: Kassie Acquanetta Bradley, MD;  Location: [facility] OR;  Service: Thoracic;  Laterality: N/A;    Prior to Admission medications   Medication Sig Start Date End Date Taking? Authorizing Provider  albuterol  (PROVENTIL ) (2.5 MG/3ML) 0.083% nebulizer solution  INHALE 3 ML BY NEBULIZATION EVERY 6 HOURS AS NEEDED FOR WHEEZING OR SHORTNESS OF BREATH 12/13/23   Kassie Acquanetta Bradley, MD  albuterol  (VENTOLIN  HFA) 108 (90 Base) MCG/ACT inhaler Inhale 2 puffs into the lungs every 6 (six) hours as needed for wheezing or shortness of breath. 01/08/24   Kassie Acquanetta Bradley, MD  aspirin  EC 81 MG tablet Take 1 tablet (81 mg total) by mouth daily. Swallow whole. Patient taking differently: Take 81 mg by mouth daily. Chewable 07/16/22   Nahser, Aleene PARAS, MD  Bismuth Tribromoph-Petrolatum (XEROFORM OCCLUSIVE GAUZE STRIP) PADS Apply 1 each topically as directed. 09/17/22   Tobie Yetta HERO, MD  bumetanide  (BUMEX ) 1 MG tablet Take 1 tablet (1 mg total) by mouth 2 (two) times daily. 03/11/24   Nahser, Aleene PARAS, MD  buPROPion  (WELLBUTRIN  XL) 150 MG 24 hr tablet Take 1 tablet (150 mg total) by mouth daily. 05/25/24   Mozingo, Regina Nattalie, NP  clonazePAM  (KLONOPIN ) 1 MG tablet TAKE 1 TABLET BY MOUTH 3 TIMES A DAY AS NEEDED AND 1 EXTRA FOR SEVERE ANXIETY UP TO 10 A MONTH. 05/25/24   Mozingo, Regina Nattalie, NP   cyclobenzaprine  (FLEXERIL ) 10 MG tablet Take 1 tablet (10 mg total) by mouth 2 (two) times daily as needed for muscle spasms. 05/29/24   Leath-Warren, Etta PARAS, NP  dexlansoprazole  (DEXILANT ) 60 MG capsule Take 1 capsule (60 mg total) by mouth daily. 04/13/24   Cirigliano, Vito V, DO  diclofenac  (VOLTAREN ) 50 MG EC tablet Take 50 mg by mouth 2 (two) times daily as needed (can repeat if needed). 05/29/24   [provider]  diphenhydrAMINE  (BENADRYL ) 50 MG/ML injection Inject 25 mg into the vein 5 (five) times daily as needed (for nausea  (is 50 mg/ml)).    [provider]  EPINEPHRINE  0.3 mg/0.3 mL IJ SOAJ injection INJECT 0.3 MG INTO THE MUSCLE AS NEEDED FOR ANAPHYLAXIS. 04/25/23   Kassie Acquanetta Bradley, MD  famotidine  (PEPCID ) 40 MG/5ML suspension Take 2.5 mLs (20 mg total) by mouth daily. Patient taking differently: Take 20 mg by mouth daily as needed for heartburn or indigestion. 04/13/24 06/03/24  Cirigliano, Vito V, DO  FLORASTOR 250 MG capsule Take 250 mg by mouth 2 (two) times daily.    [provider]  FLUoxetine  (PROZAC ) 40 MG capsule Take 1 capsule (40 mg total) by mouth daily. 05/25/24   Mozingo, Regina Nattalie, NP  fluticasone  furoate-vilanterol (BREO ELLIPTA ) 100-25 MCG/ACT AEPB Inhale 2 puffs into the lungs daily.    [provider]  fluticasone -salmeterol (ADVAIR HFA) 230-21 MCG/ACT inhaler Inhale 2 puffs into the lungs 2 (two) times daily. Patient taking differently: Inhale 2 puffs into the lungs 2 (two) times daily as needed (for flares). 01/08/24   Kassie Acquanetta Bradley, MD  Galcanezumab -gnlm (EMGALITY  [place]) Inject into the skin every 30 (thirty) days.    [provider]  guaiFENesin  (ROBITUSSIN) 100 MG/5ML liquid Take 10 mLs by mouth every 6 (six) hours as needed for cough or to loosen phlegm. 05/22/24   Von Bellis, MD  Heparin  Na, Pork, Lock Flsh PF (BD HEPARIN  POSIFLUSH) 100 UNIT/ML SOLN USE 5 MLS IN PORT A CATH ONCE DAILY AFTER MEDICATION  ADMINISTRATION AS A HEPLOCK AS DIRECTED. 04/01/24     hydrocortisone  (CORTEF ) 10 MG tablet Take 1-2 tablets (10-20 mg total) by mouth See admin instructions. Take 20 mg in the am and 10mg  in the evening. Take after completing prednisone  Patient taking differently: Take 10-20 mg by mouth See admin instructions. Take  20 mg by mouth in the morning and 10 mg in the afternoon. 09/29/22   Tobie Yetta HERO, MD  hydrOXYzine  (ATARAX ) 10 MG/5ML syrup Take 12.5 mLs (25 mg total) by mouth 3 (three) times daily. 05/22/24   Von Bellis, MD  hyoscyamine  (LEVSIN  SL) 0.125 MG SL tablet Place 1 tablet (0.125 mg total) under the tongue every 6 (six) hours as needed for cramping (esophageal spasm). Up to 1.25 mg daily 05/22/24   Von Bellis, MD  ipratropium-albuterol  (DUONEB) 0.5-2.5 (3) MG/3ML SOLN Take 3 mLs by nebulization every 6 (six) hours as needed (for shortness of breath or wheezing).    [provider]  lamoTRIgine  (LAMICTAL ) 200 MG tablet TAKE 1 TABLET BY MOUTH EVERYDAY AT BEDTIME 05/25/24   Mozingo, Regina Nattalie, NP  lamoTRIgine  (LAMICTAL ) 25 MG tablet Take two tablets every morning. 05/25/24   Mozingo, Regina Nattalie, NP  lidocaine  (LIDODERM ) 5 % Place 1 patch onto the skin daily. Remove & Discard patch within 12 hours or as directed by MD 05/29/24   Leath-Warren, Etta PARAS, NP  lidocaine  (XYLOCAINE ) 2 % solution Use as directed 10 mLs in the mouth or throat every 4 (four) hours as needed (for esophageal pain and pain with swallowing.). 12/22/23   Harris, Abigail, PA-C  nitroGLYCERIN  (NITROSTAT ) 0.4 MG SL tablet DISSOLVE 1 TAB UNDER THE TONGUE EVERY 5 MINUTES AS NEEDED FOR CHEST PAIN. MAX OF 3 DOSES, THEN 911. 07/03/23   Nahser, Aleene PARAS, MD  NURTEC 75 MG TBDP Take 75 mg by mouth daily as needed (migraines). 09/03/23   [provider]  OLANZapine  (ZYPREXA ) 5 MG tablet Take 1 tablet (5 mg total) by mouth at bedtime. 05/25/24   Mozingo, Regina Nattalie, NP  OXYGEN  Inhale 3-5 L/min into the lungs  continuous.    [provider]  potassium chloride  (KLOR-CON ) 20 MEQ packet Take 20 mEq by mouth daily. Can be mixed in the juice for better test 05/22/24 05/22/25  Von Bellis, MD  QVAR  REDIHALER 80 MCG/ACT inhaler Inhale 2 puffs into the lungs daily. 05/10/23   [provider]  rosuvastatin  (CRESTOR ) 10 MG tablet Take 10 mg by mouth in the morning. Mid morning 02/28/18   [provider]  sacubitril -valsartan  (ENTRESTO ) 24-26 MG Take 1 tablet by mouth 2 (two) times daily.    [provider]  sodium chloride  flush (BD POSIFLUSH) 0.9 % SOLN injection Use as directed for catheter maintenance 02/07/24     Sodium Chloride  Flush (NORMAL SALINE FLUSH) 0.9 % SOLN Use as directed for catheter maintenance. 02/07/24     Sodium Chloride  Flush (NORMAL SALINE FLUSH) 0.9 % SOLN Use 10mls as directed for catheter maintenance 05/29/24     sucralfate  (CARAFATE ) 1 GM/10ML suspension Take 1 g by mouth daily as needed (as directed for ulcers).    [provider]  SYNTHROID  100 MCG tablet Take 1 tablet (100 mcg total) by mouth every morning. Patient taking differently: Take 100 mcg by mouth every morning.  Must be name brand* 10/19/21   Gudena, Vinay, MD  Tiotropium Bromide  Monohydrate (SPIRIVA  RESPIMAT) 1.25 MCG/ACT AERS Inhale 2 puffs into the lungs daily. 01/08/24   Kassie Acquanetta Bradley, MD  topiramate  (TOPAMAX ) 50 MG tablet Take 150 mg by mouth at bedtime. 12/25/19   [provider]  zolpidem  (AMBIEN  CR) 12.5 MG CR tablet TAKE ONE TABLET BY MOUTH AT BEDTIME AS NEEDED FOR SLEEP. Patient taking differently: Take 12.5 mg by mouth at bedtime. 05/14/23       Current Facility-Administered  Medications  Medication Dose Route Frequency Provider Last Rate Last Admin   0.9 %  sodium chloride  infusion   Intravenous Continuous Debby Camila LABOR, MD 75 mL/hr at 06/22/24 0758 Infusion Verify at 06/22/24 0758   acetaminophen  (TYLENOL ) tablet 650 mg  650 mg Oral Q6H PRN Thomas, Sara-Maiz A,  MD   650 mg at 06/22/24 0109   Or   acetaminophen  (TYLENOL ) suppository 650 mg  650 mg Rectal Q6H PRN Debby Camila LABOR, MD       albuterol  (PROVENTIL ) (2.5 MG/3ML) 0.083% nebulizer solution 2.5 mg  2.5 mg Nebulization Q2H PRN Thomas, Sara-Maiz A, MD       aspirin  EC tablet 81 mg  81 mg Oral Daily Debby Camila LABOR, MD       bisacodyl  (DULCOLAX) suppository 10 mg  10 mg Rectal Daily PRN Debby Camila LABOR, MD       Chlorhexidine  Gluconate Cloth 2 % PADS 6 each  6 each Topical Daily Opyd, Timothy S, MD       diphenhydrAMINE  (BENADRYL ) injection 25 mg  25 mg Intravenous Q6H PRN Thomas, Sara-Maiz A, MD   25 mg at 06/22/24 0308   feeding supplement (BOOST / RESOURCE BREEZE) liquid 1 Container  1 Container Oral TID BM Opyd, Evalene RAMAN, MD       fentaNYL  (SUBLIMAZE ) injection 12.5 mcg  12.5 mcg Intravenous Q2H PRN Debby Camila LABOR, MD       heparin  injection 5,000 Units  5,000 Units Subcutaneous Q8H Debby Camila A, MD   5,000 Units at 06/22/24 9488   hydrALAZINE  (APRESOLINE ) injection 5 mg  5 mg Intravenous Q8H PRN Debby Camila LABOR, MD       hydrocortisone  sodium succinate  (SOLU-CORTEF ) 100 MG injection 100 mg  100 mg Intravenous Q8H Debby Camila A, MD   100 mg at 06/22/24 9488   levothyroxine  (SYNTHROID ) tablet 100 mcg  100 mcg Oral q morning Debby Camila LABOR, MD       LORazepam  (ATIVAN ) injection 0.5 mg  0.5 mg Intravenous Q6H PRN Debby Camila A, MD   0.5 mg at 06/22/24 0818   rosuvastatin  (CRESTOR ) tablet 10 mg  10 mg Oral q AM Debby Camila LABOR, MD       sodium chloride  flush (NS) 0.9 % injection 10-40 mL  10-40 mL Intracatheter Q12H Opyd, Evalene RAMAN, MD   10 mL at 06/21/24 2347   sodium chloride  flush (NS) 0.9 % injection 10-40 mL  10-40 mL Intracatheter PRN Opyd, Evalene RAMAN, MD        Allergies as of 06/21/2024 - Review Complete 06/21/2024  Allergen Reaction Noted   Ferrous bisglycinate chelate [iron] Anaphylaxis and Other (See Comments) 07/15/2020   Mushroom  extract complex (obsolete) Anaphylaxis 04/23/2019   Na ferric gluc cplx in sucrose Anaphylaxis 11/16/2014   Tape Hives 02/03/2015   Cymbalta [duloxetine hcl] Swelling and Anxiety 02/06/2011   Hydromorphone  Other (See Comments) 05/08/2016   Morphine Other (See Comments) 05/18/2024   Ondansetron  hcl Itching and Other (See Comments) 05/22/2022   Promethazine Other (See Comments) 08/03/2021   Succinylcholine Other (See Comments) 12/23/2018   Buprenorphine hcl Hives 02/03/2015   Compazine Other (See Comments) 02/06/2011   Duloxetine  02/06/2011   Metoclopramide Other (See Comments) 05/08/2016   Morphine and codeine Hives 02/06/2011   Ondansetron  Hives and Rash 02/03/2015   Promethazine hcl Hives 02/06/2011   Silver  Other (See Comments) and Rash 02/06/2011    Family History  Adopted: Yes  Family history unknown: Yes  Social History   Socioeconomic History   Marital status: Married    Spouse name: Not on file   Number of children: Not on file   Years of education: Not on file   Highest education level: Not on file  Occupational History   Not on file  Tobacco Use   Smoking status: Never   Smokeless tobacco: Never  Vaping Use   Vaping status: Never Used  Substance and Sexual Activity   Alcohol use: Not Currently    Comment: social    Drug use: No   Sexual activity: Not Currently    Birth control/protection: Surgical  Other Topics Concern   Not on file  Social History Narrative   Not on file   Social Drivers of Health   Financial Resource Strain: Medium Risk (08/21/2017)   Received from [facility]   Overall Financial Resource Strain (CARDIA)    Difficulty of Paying Living Expenses: Somewhat hard  Food Insecurity: No Food Insecurity (06/21/2024)   Hunger Vital Sign    Worried About Running Out of Food in the Last Year: Never true    Ran Out of Food in the Last Year: Never true  Transportation Needs: No Transportation Needs (06/21/2024)   PRAPARE -  Administrator, Civil Service (Medical): No    Lack of Transportation (Non-Medical): No  Physical Activity: Unknown (08/21/2017)   Received from [facility]   Exercise Vital Sign    Days of Exercise per Week: 0 days    Minutes of Exercise per Session: Not on file  Stress: Stress Concern Present (08/21/2017)   Received from [facility] - Occupational Stress Questionnaire    Feeling of Stress : Very much  Social Connections: Unknown (02/12/2022)   Received from [facility]   Social Network    Social Network: Not on file  Intimate Partner Violence: Not At Risk (06/21/2024)   Humiliation, Afraid, Rape, and Kick questionnaire    Fear of Current or Ex-Partner: No    Emotionally Abused: No    Physically Abused: No    Sexually Abused: No    Review of Systems: ROS otherwise negative except as mentioned in HPI.  Physical Exam: Vital signs in last 24 hours: Temp:  [97.3 F (36.3 C)-98.9 F (37.2 C)] 97.8 F (36.6 C) (09/22 0836) Pulse Rate:  [81-95] 83 (09/22 0836) Resp:  [11-18] 14 (09/22 0445) BP: (111-137)/(74-94) 121/77 (09/22 0836) SpO2:  [99 %-100 %] 100 % (09/22 0836) Weight:  [75.2 kg] 75.2 kg (09/21 2103) Last BM Date : 06/21/24 General:  Alert, Well-developed, well-nourished, pleasant and cooperative in NAD Head:  Normocephalic and atraumatic. Eyes:  Sclera clear, no icterus.  Conjunctiva pink. Ears:  Normal auditory acuity. Mouth:  No deformity or lesions.   Lungs:  Clear throughout to auscultation.  No wheezes, crackles, or rhonchi.  Heart:  Regular rate and rhythm; no murmurs, clicks, rubs, or gallops. Abdomen:  Soft, non-distended.  BS present.  Non-tender. Msk:  Symmetrical without gross deformities.  Pulses:  Normal pulses noted. Extremities:  Without clubbing or edema. Neurologic:  Alert and oriented x 4;  grossly normal neurologically. Skin:  Intact without significant  lesions or rashes. Psych:  Alert and cooperative. Normal mood and affect.  Intake/Output from previous day: 09/21 0701 - 09/22 0700 In: 815.1 [I.V.:383.4; IV Piggyback:431.8] Out: -  Intake/Output this shift: Total I/O In: 261.5 [I.V.:261.5] Out: -   Lab Results:  Recent Labs    06/21/24 1701 06/21/24 2200 06/22/24 0331  WBC 7.2 5.7 5.1  HGB 12.2 12.0 10.5*  HCT 38.5 42.2 34.0*  PLT 271 222 209   BMET Recent Labs    06/21/24 1701 06/21/24 2200 06/22/24 0331  NA 139 137 140  K 2.6* 3.6 3.2*  CL 98 103 105  CO2 23 17* 21*  GLUCOSE 129* 110* 164*  BUN 27* 26* 20  CREATININE 1.93* 1.56* 1.43*  CALCIUM  9.6 8.3* 8.1*   LFT Recent Labs    06/22/24 0331  PROT 6.9  ALBUMIN 4.0  AST 74*  ALT 119*  ALKPHOS 79  BILITOT <0.2   Hepatitis Panel Recent Labs    06/21/24 2200  HEPBSAG NON REACTIVE  HCVAB NON REACTIVE  HEPAIGM NON REACTIVE  HEPBIGM NON REACTIVE    Studies/Results: DG Chest 2 View Result Date: 06/21/2024 CLINICAL DATA:  Nausea and vomiting. EXAM: CHEST - 2 VIEW COMPARISON:  05/18/2024. FINDINGS: The heart size and mediastinal contours are within normal limits. There is atherosclerotic calcification of the aorta. Focal airspace disease is noted in the retrocardiac region on the left. No effusion or pneumothorax is seen. A left PICC line appear stable. Surgical clips are noted in the chest walls bilaterally. No acute osseous abnormality. IMPRESSION: Airspace disease in the retrocardiac region on the left, possible residual pneumonia. Electronically Signed   By: Leita Birmingham M.D.   On: 06/21/2024 18:36   CT ABDOMEN PELVIS WO CONTRAST Result Date: 06/21/2024 CLINICAL DATA:  Left flank pain with vomiting. EXAM: CT ABDOMEN AND PELVIS WITHOUT CONTRAST TECHNIQUE: Multidetector CT imaging of the abdomen and pelvis was performed following the standard protocol without IV contrast. RADIATION DOSE REDUCTION: This exam was performed according to the departmental  dose-optimization program which includes automated exposure control, adjustment of the mA and/or kV according to patient size and/or use of iterative reconstruction technique. COMPARISON:  01/23/2023. FINDINGS: Lower chest: Atelectasis is noted at the lung bases. Hepatobiliary: Stable scattered subcentimeter hypodensities are noted in the liver, statistically most likely representing cysts or hemangiomas. No biliary ductal dilatation. The gallbladder is without stones. Pancreas: Unremarkable. No pancreatic ductal dilatation or surrounding inflammatory changes. Spleen: Normal in size without focal abnormality. Adrenals/Urinary Tract: The right adrenal gland is within normal limits. There is a 9 mm nodule in the left adrenal gland. No additional imaging is required at this time. There are subcentimeter hypodensities in the kidneys, possible cysts. An indeterminate exophytic lesion is noted in the left kidney measuring 1.3 cm. Renal calculi are present bilaterally. No obstructive uropathy is seen. The bladder is unremarkable. Stomach/Bowel: The stomach is within normal limits. No bowel obstruction, free air, or pneumatosis is seen. A moderate amount of retained stool is present in the colon. Appendix is surgically absent. Vascular/Lymphatic: Aortic atherosclerosis. No enlarged abdominal or pelvic lymph nodes. Reproductive: Status post hysterectomy. No adnexal masses. Other: No abdominopelvic ascites. A fat containing umbilical hernia is noted. There are bilateral breast implants. Musculoskeletal: Degenerative changes are present in the thoracolumbar spine. No acute osseous abnormality. IMPRESSION: 1. No acute intra-abdominal process. 2. Bilateral nephrolithiasis without evidence of obstruction. 3. Complex lesion in in the lower pole of the left kidney with indeterminate imaging characteristics. Ultrasound or MRI is recommended for further characterization on follow-up. 4. Moderate amount of retained stool in the colon  suggesting constipation. 5. Aortic atherosclerosis. Electronically Signed   By: Leita Birmingham M.D.   On: 06/21/2024 18:33    IMPRESSION:  1) Nausea/Vomiting 2) Recurrent  Esophageal Candidiasis 3) Esophageal stenosis From Dr. Rennis noted:  She has recurrent nausea/vomiting which is likely multifactorial, partly related to underlying chronic medical conditions, polypharmacy, but also a component of proximal esophageal stricture and recurrent candidiasis playing a role.  Last upper endoscopy did show resolution of the candidiasis but biopsies with increased esophageal lymphocytes. 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 reflux, possibly recurrent infection, immunosuppression?).  There was concern that since she vomited shortly after ingesting her pills.  Was wondering if her Addison's is suboptimally controlled due to unreliable steroid dosing.  They discussed possible PEG tube placement and referral was put in for discussion with IR.  4) Addison's Disease - Will need stress dose steroids for endoscopic procedure 5) Chronic hypoxic respiratory failure on home O2 6) CHF 7) CKD 3  8) Anxiety/depression - Follows in the [facility]    9) Hypokalemia: Potassium was 2.6 on presentation, up to 3.2 today. 10) AKI likely due to dehydration with poor p.o. intake 11) Elevated LFTs: ALT up to 136, 119 today and AST up to 79, 74 today.  This is new compared to August as they were normal.  Total bili and alk phos normal.  Noncontrast CT scan shows no biliary abnormalities.  Does show stable scattered subcentimeter hypodensities within the liver statistically most likely representing cyst or hemangiomas.  Will likely plan to monitor for now. 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  Will plan for 9/23. - Will need stress dose steroids, is on hydrocortisone  100 mg IV every 8 hours here so that should be adequate. -Pantoprazole  40 mg IV BID for now. -Will hold SQ heparin  after dose late tonight.  Harlene BIRCH. Zehr  06/22/2024, 9:34 AM    Attending physician's note   I have taken history, reviewed the chart and examined the patient. I performed a substantive portion of this encounter, including complete performance of at least one of the key components, in conjunction with the APP. I agree with the Advanced Practitioner's note, impression and recommendations.   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  N/V/failure to thrive/recurrent dehydration with significant component of oropharyngeal dysphagia on MBS.  No aspiration-being considered for PEG as outpt (for feeds/fluids/meds). Ned CT AP without contrast yesterday except for constipation.  Multiple comorbidities including Addison's disease (on IV hydrocortisone ), chronic respiratory failure on home O2, CHF, CKD3, anxiety/depression.  Plan: -EGD with dil/Bx in AM.  If+ candidiasis, then we will send Bx for fungal C/S. -IV Protonix  -Agree with previous assessment by Dr. San that she would require PEG for feeds/meds.  Will ask for IR consultation tomorrow. -She had appointment with ID today.  Appreciate ID agreeing to see her as inpatient tomorrow -After EGD, MiraLAX for constipation.  I have discussed risks and benefits of EGD in detail.  She agrees to proceed.   Anselm Bring, MD [facility] [phone]

## 2024-06-23 ENCOUNTER — Inpatient Hospital Stay (HOSPITAL_COMMUNITY)

## 2024-06-23 ENCOUNTER — Inpatient Hospital Stay (HOSPITAL_COMMUNITY): Admitting: Registered Nurse

## 2024-06-23 ENCOUNTER — Encounter (HOSPITAL_COMMUNITY): Payer: Self-pay | Admitting: Family Medicine

## 2024-06-23 ENCOUNTER — Encounter (HOSPITAL_COMMUNITY): Admission: EM | Disposition: E | Payer: Self-pay | Source: Home / Self Care | Attending: Internal Medicine

## 2024-06-23 DIAGNOSIS — N179 Acute kidney failure, unspecified: Secondary | ICD-10-CM | POA: Diagnosis not present

## 2024-06-23 DIAGNOSIS — D649 Anemia, unspecified: Secondary | ICD-10-CM

## 2024-06-23 DIAGNOSIS — J954 Chemical pneumonitis due to anesthesia: Secondary | ICD-10-CM

## 2024-06-23 DIAGNOSIS — R7401 Elevation of levels of liver transaminase levels: Secondary | ICD-10-CM

## 2024-06-23 HISTORY — PX: ESOPHAGOGASTRODUODENOSCOPY: SHX5428

## 2024-06-23 LAB — COMPREHENSIVE METABOLIC PANEL WITH GFR
ALT: 98 U/L — ABNORMAL HIGH (ref 0–44)
AST: 47 U/L — ABNORMAL HIGH (ref 15–41)
Albumin: 3.9 g/dL (ref 3.5–5.0)
Alkaline Phosphatase: 75 U/L (ref 38–126)
Anion gap: 11 (ref 5–15)
BUN: 15 mg/dL (ref 6–20)
CO2: 20 mmol/L — ABNORMAL LOW (ref 22–32)
Calcium: 8.3 mg/dL — ABNORMAL LOW (ref 8.9–10.3)
Chloride: 112 mmol/L — ABNORMAL HIGH (ref 98–111)
Creatinine, Ser: 1.05 mg/dL — ABNORMAL HIGH (ref 0.44–1.00)
GFR, Estimated: >60 mL/min (ref >60)
Glucose, Bld: 121 mg/dL — ABNORMAL HIGH (ref 70–99)
Potassium: 4.2 mmol/L (ref 3.5–5.1)
Sodium: 143 mmol/L (ref 135–145)
Total Bilirubin: 0.2 mg/dL (ref 0.0–1.2)
Total Protein: 6.4 g/dL — ABNORMAL LOW (ref 6.5–8.1)

## 2024-06-23 LAB — CBC WITH DIFFERENTIAL/PLATELET
Abs Immature Granulocytes: 0.02 K/uL (ref 0.00–0.07)
Basophils Absolute: 0 K/uL (ref 0.0–0.1)
Basophils Relative: 0 %
Eosinophils Absolute: 0 K/uL (ref 0.0–0.5)
Eosinophils Relative: 0 %
HCT: 35 % — ABNORMAL LOW (ref 36.0–46.0)
Hemoglobin: 10.5 g/dL — ABNORMAL LOW (ref 12.0–15.0)
Immature Granulocytes: 0 %
Lymphocytes Relative: 18 %
Lymphs Abs: 1.3 K/uL (ref 0.7–4.0)
MCH: 24.3 pg — ABNORMAL LOW (ref 26.0–34.0)
MCHC: 30 g/dL (ref 30.0–36.0)
MCV: 81 fL (ref 80.0–100.0)
Monocytes Absolute: 0.5 K/uL (ref 0.1–1.0)
Monocytes Relative: 6 %
Neutro Abs: 5.5 K/uL (ref 1.7–7.7)
Neutrophils Relative %: 76 %
Platelets: 241 K/uL (ref 150–400)
RBC: 4.32 MIL/uL (ref 3.87–5.11)
RDW: 23.9 % — ABNORMAL HIGH (ref 11.5–15.5)
Smear Review: NORMAL
WBC: 7.2 K/uL (ref 4.0–10.5)
nRBC: 0 % (ref 0.0–0.2)

## 2024-06-23 LAB — PROTIME-INR
INR: 1 (ref 0.8–1.2)
Prothrombin Time: 13.5 s (ref 11.4–15.2)

## 2024-06-23 LAB — PHOSPHORUS: Phosphorus: 1.7 mg/dL — ABNORMAL LOW (ref 2.5–4.6)

## 2024-06-23 LAB — MAGNESIUM: Magnesium: 2.3 mg/dL (ref 1.7–2.4)

## 2024-06-23 SURGERY — EGD (ESOPHAGOGASTRODUODENOSCOPY)
Anesthesia: Monitor Anesthesia Care

## 2024-06-23 MED ORDER — KCL IN DEXTROSE-NACL 40-5-0.9 MEQ/L-%-% IV SOLN
INTRAVENOUS | Status: DC
  Filled 2024-06-23 (×2): qty 1000

## 2024-06-23 MED ORDER — FENTANYL CITRATE (PF) 100 MCG/2ML IJ SOLN
INTRAMUSCULAR | Status: AC
  Filled 2024-06-23: qty 2

## 2024-06-23 MED ORDER — SOD CITRATE-CITRIC ACID 500-334 MG/5ML PO SOLN
30.0000 mL | ORAL | Status: AC
  Administered 2024-06-23: 30 mL via ORAL
  Filled 2024-06-23: qty 30

## 2024-06-23 MED ORDER — CEFAZOLIN SODIUM-DEXTROSE 2-4 GM/100ML-% IV SOLN
2.0000 g | INTRAVENOUS | Status: AC
  Administered 2024-06-24: 2 g via INTRAVENOUS
  Filled 2024-06-23: qty 100

## 2024-06-23 MED ORDER — OLANZAPINE 5 MG PO TABS
5.0000 mg | ORAL_TABLET | Freq: Every day | ORAL | Status: DC
  Administered 2024-06-23 – 2024-06-26 (×4): 5 mg via ORAL
  Filled 2024-06-23 (×5): qty 1

## 2024-06-23 MED ORDER — SODIUM CHLORIDE 0.9 % IV SOLN
150.0000 mg | Freq: Every day | INTRAVENOUS | Status: DC
  Administered 2024-06-23 – 2024-06-26 (×4): 150 mg via INTRAVENOUS
  Filled 2024-06-23 (×7): qty 7.5

## 2024-06-23 MED ORDER — PROPOFOL 1000 MG/100ML IV EMUL
INTRAVENOUS | Status: AC
  Filled 2024-06-23: qty 100

## 2024-06-23 MED ORDER — MIDAZOLAM HCL 5 MG/5ML IJ SOLN
INTRAMUSCULAR | Status: DC | PRN
  Administered 2024-06-23: 2 mg via INTRAVENOUS

## 2024-06-23 MED ORDER — MIDAZOLAM HCL 2 MG/2ML IJ SOLN
INTRAMUSCULAR | Status: AC
  Filled 2024-06-23: qty 2

## 2024-06-23 MED ORDER — PROPOFOL 500 MG/50ML IV EMUL
INTRAVENOUS | Status: DC | PRN
  Administered 2024-06-23: 30 mg via INTRAVENOUS
  Administered 2024-06-23: 150 ug/kg/min via INTRAVENOUS
  Administered 2024-06-23: 20 mg via INTRAVENOUS

## 2024-06-23 MED ORDER — ALBUTEROL SULFATE (2.5 MG/3ML) 0.083% IN NEBU
INHALATION_SOLUTION | RESPIRATORY_TRACT | Status: AC
  Filled 2024-06-23: qty 3

## 2024-06-23 MED ORDER — FLUCONAZOLE IN SODIUM CHLORIDE 400-0.9 MG/200ML-% IV SOLN
800.0000 mg | Freq: Once | INTRAVENOUS | Status: DC
  Filled 2024-06-23: qty 400

## 2024-06-23 MED ORDER — SODIUM CHLORIDE 0.9 % IV SOLN: INTRAVENOUS | Status: DC

## 2024-06-23 MED ORDER — DEXAMETHASONE SODIUM PHOSPHATE 10 MG/ML IJ SOLN
INTRAMUSCULAR | Status: DC | PRN
  Administered 2024-06-23: 10 mg via INTRAVENOUS

## 2024-06-23 MED ORDER — LIDOCAINE 2% (20 MG/ML) 5 ML SYRINGE
INTRAMUSCULAR | Status: DC | PRN
  Administered 2024-06-23: 40 mg via INTRAVENOUS

## 2024-06-23 MED ORDER — FLUCONAZOLE IN SODIUM CHLORIDE 400-0.9 MG/200ML-% IV SOLN: 400.0000 mg | INTRAVENOUS | Status: DC

## 2024-06-23 MED ORDER — FENTANYL CITRATE (PF) 100 MCG/2ML IJ SOLN
INTRAMUSCULAR | Status: DC | PRN
  Administered 2024-06-23: 25 ug via INTRAVENOUS

## 2024-06-23 MED ORDER — HYDROXYZINE HCL 25 MG PO TABS
25.0000 mg | ORAL_TABLET | Freq: Three times a day (TID) | ORAL | Status: DC
  Administered 2024-06-23: 25 mg via ORAL
  Filled 2024-06-23 (×3): qty 1

## 2024-06-23 MED ORDER — DEXMEDETOMIDINE HCL IN NACL 80 MCG/20ML IV SOLN
INTRAVENOUS | Status: DC | PRN
  Administered 2024-06-23: 12 ug via INTRAVENOUS

## 2024-06-23 MED ORDER — PIPERACILLIN-TAZOBACTAM 3.375 G IVPB
3.3750 g | Freq: Three times a day (TID) | INTRAVENOUS | Status: DC
  Administered 2024-06-23 – 2024-06-27 (×11): 3.375 g via INTRAVENOUS
  Filled 2024-06-23 (×11): qty 50

## 2024-06-23 NOTE — Anesthesia Preprocedure Evaluation (Signed)
 Anesthesia Evaluation  Patient identified by MRN, date of birth, ID band Patient awake    Reviewed: Allergy & Precautions, NPO status , Patient's Chart, lab work & pertinent test results  History of Anesthesia Complications (+) PONV and history of anesthetic complications  Airway Mallampati: II  TM Distance: >3 FB Neck ROM: Full    Dental  (+) Teeth Intact, Dental Advisory Given   Pulmonary shortness of breath and Long-Term Oxygen  Therapy, asthma    Pulmonary exam normal breath sounds clear to auscultation       Cardiovascular hypertension, Pt. on medications +CHF  Normal cardiovascular exam+ dysrhythmias Supra Ventricular Tachycardia + Valvular Problems/Murmurs AS  Rhythm:Regular Rate:Normal     Neuro/Psych  Headaches, Seizures -,  PSYCHIATRIC DISORDERS Anxiety Depression    Pituitary adenoma     GI/Hepatic Neg liver ROS, hiatal hernia,GERD  Medicated,,Nausea, vomiting, dysphagia, history of stricture   Endo/Other  Hypothyroidism  Adrenal insufficiency Thyroid  cancer STATUS POST SURGICAL REMOVAL-CURRENT ON THYROID  REPLACEMENT  Renal/GU Renal InsufficiencyRenal disease     Musculoskeletal negative musculoskeletal ROS (+)    Abdominal   Peds  Hematology  (+) Blood dyscrasia, anemia   Anesthesia Other Findings Day of surgery medications reviewed with the patient.  Breast cancer   Reproductive/Obstetrics                              Anesthesia Physical Anesthesia Plan  ASA: 4  Anesthesia Plan: MAC   Post-op Pain Management:    Induction: Intravenous  PONV Risk Score and Plan: 3 and TIVA and Treatment may vary due to age or medical condition  Airway Management Planned: Natural Airway and Simple Face Mask  Additional Equipment:   Intra-op Plan:   Post-operative Plan:   Informed Consent: I have reviewed the patients History and Physical, chart, labs and discussed the  procedure including the risks, benefits and alternatives for the proposed anesthesia with the patient or authorized representative who has indicated his/her understanding and acceptance.     Dental advisory given  Plan Discussed with: CRNA and Anesthesiologist  Anesthesia Plan Comments:         Anesthesia Quick Evaluation

## 2024-06-23 NOTE — Consult Note (Signed)
 NAME: [name]  DOB: 11-Sep-1971  MRN: [id number]  Date/Time: 06/23/2024 9:19 PM  REQUESTING PROVIDER: Dr. Charlanne Subjective:  REASON FOR CONSULT: Esophageal candidiasis When I went to see the patient she had returned from endoscopy and was complaining of acute left-sided chest pain and was also having shortness of breath Hospitalist at bedside and has ordered x-ray chest Spoke to the husband who gave the history Pt is admitted for upper endoscopy which she had today ID is seeign her for recurrent esophageal candidiasis  [name] is a 53 y.o. 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 and, knife and this was complicated by adrenal insufficiency and on hydrocortisone  infected port which was removed and now she has a PICC line Patient is followed by GI for suspected primary esophageal dysphagia Has undergone multiple EGDs on 12/26/2023 severe esophageal candidiasis and a benign-appearing esophageal stenosis in the proximal esophagus On 02/11/2024 EGD showed esophageal candidiasis improved from previous 04/13/2024 EGD showed benign-appearing stenosis at 16 cm dilated with 16 mm Savary with appropriate mucosal disruption, subtle white plaques in the lower esophagus She has been treated with multiple courses of fluconazole  .  12/26/2023 200 mg tablet given for 21 days 02/13/2024 200 mg tablet given for 20 days 05/27/2024 200 mg tablet given for 21 days.  Patient underwent upper endoscopy today  A benign-appearing intrinsic mild stenosis was found at 16 cm from the incisors.   This measured 1.2 cm into 1 cm.  The stenosis was traversed.  Had balloon dilatation There was a few patches of yellow plaques found at the middle third of t A widely patent incomplete Schatzki ring was found at the gastroesophageal junction he esophagus.  A biopsy was sent for fungal culture and susceptibility.     Past Infectious history Well known to the [facility] team For recurrent PORt infection- sphinomonas paucimobilis- 2023 Staph epi bacteremia June 2023 Enterobacter aerogenes/pantoea bacteremia secondary to PORT Port removed 04/24/24 bacillus bacteremia   Past Medical History:  Diagnosis Date   Addison's disease [facility])    Adrenal insufficiency    Anemia    Anxiety    Aortic stenosis    Aortic stenosis    Appendicitis 12/19/2009   Appendicitis    Breast cancer (HCC)    STATUS POST BILATERAL MASTECTOMY. STATUS POST RECONSTRUCTION. SHE HAD SILICONE BREAST IMPLANTS AND THE LEFT IMPLANT IS LEAKING SLIGHTLY   Cellulitis of right middle finger 11/07/2018   Cervical cancer (HCC) 12/23/2018   Chest pain    CHF with right heart failure (HCC) 04/17/2017   Chronic respiratory failure with hypoxia (HCC) 12/23/2018   CKD (chronic kidney disease) stage 3, GFR 30-59 ml/min (HCC)    Cough variant asthma 04/13/2019   Depression    Functional neurological symptom disorder with mixed symptoms 2023   GERD (gastroesophageal reflux disease)    takes Dexilant  and carafate  and gi coctail    Headache    migraines on a daily and monthly regimen    Heart murmur    History of kidney stones    Hodgkin lymphoma (HCC)    STATUS POST MANTLE RADIATION   Hodgkin's lymphoma (HCC)  1987   Hypertension    Hypoxia    Multiple lung nodules    bilateral   Necrotizing fasciitis (HCC) 12/23/2018   Non-ischemic cardiomyopathy (HCC)    Osteoporosis    Ovarian tumor (benign)    bilateral   Palpitations    Pituitary adenoma (HCC) 12/23/2018   Pneumonia    PONV (postoperative nausea and vomiting)     Pre-diabetes    per pt; no meds   Pulmonary hypertension (HCC) 12/23/2018   Raynaud phenomenon    Right heart failure (HCC) 04/17/2017   Seizures (HCC)    last febrile seizure was approx 3 weeks ago per report on 12/01/2020   Supplemental oxygen  dependent    3 liters   SVT (supraventricular tachycardia)    Tachycardia    Thyroid  cancer (HCC)    STATUS POST SURGICAL REMOVAL-CURRENT ON THYROID  REPLACEMENT    Past Surgical History:  Procedure Laterality Date   ABDOMINAL HYSTERECTOMY     AMPUTATION Left 01/30/2019   Procedure: Left Index finger amputation with flap reconstruction and repair reconstruction;  Surgeon: Camella Fallow, MD;  Location: [facility] OR;  Service: Orthopedics;  Laterality: Left;   APPENDECTOMY     BIOPSY OF SKIN SUBCUTANEOUS TISSUE AND/OR MUCOUS MEMBRANE  12/26/2023   Procedure: BIOPSY, SKIN, SUBCUTANEOUS TISSUE, OR MUCOUS MEMBRANE;  Surgeon: Leigh Elspeth SQUIBB, MD;  Location: [facility];  Service: Gastroenterology;;   breast implants and removal      breast implants but leaking      CARDIAC CATHETERIZATION  05/18/2009   NORMAL CATH   CESAREAN SECTION  2007   and 1997   COLONOSCOPY     ESOPHAGEAL DILATION  02/11/2024   Procedure: DILATION, ESOPHAGUS;  Surgeon: San Sandor GAILS, DO;  Location: [facility];  Service: Gastroenterology;;   ESOPHAGOGASTRODUODENOSCOPY N/A 12/26/2023   Procedure: EGD (ESOPHAGOGASTRODUODENOSCOPY);  Surgeon: Leigh Elspeth SQUIBB, MD;  Location: [facility];  Service: Gastroenterology;  Laterality: N/A;   ESOPHAGOGASTRODUODENOSCOPY N/A 02/11/2024   Procedure: EGD (ESOPHAGOGASTRODUODENOSCOPY);  Surgeon: San Sandor GAILS, DO;  Location: [facility];  Service: Gastroenterology;  Laterality: N/A;   ESOPHAGOGASTRODUODENOSCOPY N/A 04/13/2024   Procedure: EGD (ESOPHAGOGASTRODUODENOSCOPY);  Surgeon: San Sandor GAILS, DO;  Location: [facility];  Service: Gastroenterology;  Laterality: N/A;   HOT HEMOSTASIS N/A 12/26/2023   Procedure: EGD, WITH  ARGON PLASMA COAGULATION;  Surgeon: Leigh Elspeth SQUIBB, MD;  Location: [facility];  Service: Gastroenterology;  Laterality: N/A;   hx of chemotherapy      hx of radiation therapy      I & D EXTREMITY Left 12/23/2018   Procedure: IRRIGATION AND DEBRIDEMENT HAND / INDEX FINGER;  Surgeon: Camella Fallow, MD;  Location: [facility];  Service: Orthopedics;  Laterality: Left;   IR CV LINE INJECTION  03/22/2022   IR FLUORO GUIDE CV LINE RIGHT  10/09/2022   IR FLUORO GUIDE CV LINE RIGHT  01/18/2023   IR FLUORO GUIDE CV LINE RIGHT  03/26/2023   IR FLUORO GUIDE CV LINE RIGHT  10/10/2023   IR FLUORO GUIDE CV LINE RIGHT  01/06/2024   IR IMAGING GUIDED PORT INSERTION  05/06/2020   IR IMAGING GUIDED PORT INSERTION  12/04/2021   IR IMAGING GUIDED PORT INSERTION  02/25/2024   IR PATIENT EVAL TECH 0-60 MINS  03/12/2024   IR RADIOLOGIST EVAL & MGMT  03/12/2024   IR RADIOLOGIST EVAL & MGMT  03/31/2024   IR REMOVAL TUN ACCESS W/ PORT W/O FL MOD SED  04/27/2020   IR REMOVAL TUN ACCESS W/ PORT W/O FL  MOD SED  12/04/2021   IR REMOVAL TUN ACCESS W/ PORT W/O FL MOD SED  03/30/2022   IR REMOVAL TUN ACCESS W/ PORT W/O FL MOD SED  02/27/2024   IR REMOVAL TUN CV CATH W/O FL  02/25/2024   IR US  GUIDE VASC ACCESS RIGHT  10/09/2022   KIDNEY STONE SURGERY     LUMBAR PUNCTURE W/ INTRATHECAL CHEMOTHERAPY     MASTECTOMY     PITUITARY SURGERY     RIGHT/LEFT HEART CATH AND CORONARY ANGIOGRAPHY N/A 04/02/2018   Procedure: RIGHT/LEFT HEART CATH AND CORONARY ANGIOGRAPHY;  Surgeon: Verlin Lonni BIRCH, MD;  [facility];  Service: Cardiovascular;  Laterality: N/A;   RIGHT/LEFT HEART CATH AND CORONARY ANGIOGRAPHY N/A 08/31/2022   Procedure: RIGHT/LEFT HEART CATH AND CORONARY ANGIOGRAPHY;  Surgeon: Cherrie Toribio SAUNDERS, MD;  [facility]: [facility];  Service: Cardiovascular;  Laterality: N/A;   TOOTH EXTRACTION N/A 12/05/2020   Procedure: DENTAL RESTORATION/EXTRACTIONS;  Surgeon: Arvil Lonni RAMAN, MD;   Location: [facility];  Service: Oral Surgery;  Laterality: N/A;   TOTAL THYROIDECTOMY     VIDEO BRONCHOSCOPY Bilateral 11/14/2018   Procedure: VIDEO BRONCHOSCOPY WITHOUT FLUORO;  Surgeon: Kassie Acquanetta Bradley, MD;  Location: [facility];  Service: Cardiopulmonary;  Laterality: Bilateral;   VIDEO BRONCHOSCOPY WITH ENDOBRONCHIAL ULTRASOUND N/A 11/19/2018   Procedure: VIDEO BRONCHOSCOPY WITH ENDOBRONCHIAL ULTRASOUND;  Surgeon: Kassie Acquanetta Bradley, MD;  Location: [facility] OR;  Service: Thoracic;  Laterality: N/A;    Social History   Socioeconomic History   Marital status: Married    Spouse name: Not on file   Number of children: Not on file   Years of education: Not on file   Highest education level: Not on file  Occupational History   Not on file  Tobacco Use   Smoking status: Never   Smokeless tobacco: Never  Vaping Use   Vaping status: Never Used  Substance and Sexual Activity   Alcohol use: Not Currently    Comment: social    Drug use: No   Sexual activity: Not Currently    Birth control/protection: Surgical  Other Topics Concern   Not on file  Social History Narrative   Not on file   Social Drivers of Health   Financial Resource Strain: Medium Risk (08/21/2017)   Received from [facility]   Overall Financial Resource Strain (CARDIA)    Difficulty of Paying Living Expenses: Somewhat hard  Food Insecurity: No Food Insecurity (06/21/2024)   Hunger Vital Sign    Worried About Running Out of Food in the Last Year: Never true    Ran Out of Food in the Last Year: Never true  Transportation Needs: No Transportation Needs (06/21/2024)   PRAPARE - Administrator, Civil Service (Medical): No    Lack of Transportation (Non-Medical): No  Physical Activity: Unknown (08/21/2017)   Received from [facility]   Exercise Vital Sign    Days of Exercise per Week: 0 days    Minutes of Exercise per Session: Not on file  Stress: Stress Concern Present  (08/21/2017)   Received from [facility] - Occupational Stress Questionnaire    Feeling of Stress : Very much  Social Connections: Unknown (02/12/2022)   Received from [facility]   Social Network    Social Network: Not on file  Intimate Partner Violence: Not At Risk (06/21/2024)   Humiliation, Afraid, Rape, and Kick questionnaire  Fear of Current or Ex-Partner: No    Emotionally Abused: No    Physically Abused: No    Sexually Abused: No    Family History  Adopted: Yes  Family history unknown: Yes   Allergies  Allergen Reactions   Ferrous Bisglycinate Chelate [Iron] Anaphylaxis and Other (See Comments)    Only IV - only FERRLICET   Mushroom Extract Complex (Obsolete) Anaphylaxis   Na Ferric Gluc Cplx In Sucrose Anaphylaxis   Tape Hives    Other Reaction(s): other   Cymbalta [Duloxetine Hcl] Swelling and Anxiety   Hydromorphone  Other (See Comments)    BP drop and heart rate drops 5.1.20 PT REPORTS THAT SHE TAKES DILAUDID  AT HOME   Morphine Other (See Comments)    I clawed out my IV(s)   Ondansetron  Hcl Itching and Other (See Comments)    Clawed off my skin   Promethazine Other (See Comments)    Made me to clinch up and my arms shook   Succinylcholine Other (See Comments)    Lock Jaw   Buprenorphine Hcl Hives   Compazine Other (See Comments)    Altered mental status Aggression   Duloxetine    Metoclopramide Other (See Comments)    Dystonia   Morphine And Codeine Hives   Ondansetron  Hives and Rash    Other reaction(s): Unknown Other reaction(s): Unknown   Promethazine Hcl Hives   Silver  Other (See Comments) and Rash    Old formulation only, is able to tolerate new formulation  Other Reaction(s): other   I? Current Facility-Administered Medications  Medication Dose Route Frequency Provider Last Rate Last Admin   acetaminophen  (TYLENOL ) tablet 650 mg  650 mg Oral Q6H PRN Thomas, Sara-Maiz A, MD    650 mg at 06/22/24 1945   Or   acetaminophen  (TYLENOL ) suppository 650 mg  650 mg Rectal Q6H PRN Debby Camila LABOR, MD       albuterol  (PROVENTIL ) (2.5 MG/3ML) 0.083% nebulizer solution 2.5 mg  2.5 mg Nebulization Q2H PRN Thomas, Sara-Maiz A, MD   2.5 mg at 06/23/24 1747   [START ON 06/25/2024] aspirin  EC tablet 81 mg  81 mg Oral Daily Allred, Darrell K, PA-C       bisacodyl  (DULCOLAX) suppository 10 mg  10 mg Rectal Daily PRN Thomas, Sara-Maiz A, MD       buPROPion  (WELLBUTRIN  XL) 24 hr tablet 150 mg  150 mg Oral Daily Ghimire, Kuber, MD   150 mg at 06/23/24 1009   [START ON 06/24/2024] ceFAZolin  (ANCEF ) IVPB 2g/100 mL premix  2 g Intravenous to XRAY Allred, Darrell K, PA-C       Chlorhexidine  Gluconate Cloth 2 % PADS 6 each  6 each Topical Daily Opyd, Timothy S, MD   6 each at 06/23/24 1100   cyclobenzaprine  (FLEXERIL ) tablet 10 mg  10 mg Oral BID PRN Raenelle Coria, MD       dextrose  5 % and 0.9 % NaCl with KCl 40 mEq/L infusion   Intravenous Continuous Ghimire, Kuber, MD 100 mL/hr at 06/23/24 1805 New Bag at 06/23/24 1805   diphenhydrAMINE  (BENADRYL ) injection 25 mg  25 mg Intravenous Q6H PRN Thomas, Sara-Maiz A, MD   25 mg at 06/22/24 1946   feeding supplement (BOOST / RESOURCE BREEZE) liquid 1 Container  1 Container Oral TID BM Opyd, Timothy S, MD   1 Container at 06/22/24 1945   fentaNYL  (SUBLIMAZE ) injection 12.5 mcg  12.5 mcg Intravenous Q2H PRN Debby Camila LABOR, MD   12.5 mcg at 06/23/24  1730   FLUoxetine  (PROZAC ) capsule 40 mg  40 mg Oral Daily Ghimire, Kuber, MD   40 mg at 06/23/24 1009   hydrALAZINE  (APRESOLINE ) injection 5 mg  5 mg Intravenous Q8H PRN Debby Camila LABOR, MD       hydrocortisone  sodium succinate  (SOLU-CORTEF ) 100 MG injection 100 mg  100 mg Intravenous Q8H Debby Camila A, MD   100 mg at 06/23/24 1546   hydrOXYzine  (ATARAX ) tablet 25 mg  25 mg Oral TID Ghimire, Kuber, MD   25 mg at 06/23/24 1117   lamoTRIgine  (LAMICTAL ) tablet 200 mg  200 mg Oral QHS Ghimire,  Kuber, MD   200 mg at 06/23/24 2111   lamoTRIgine  (LAMICTAL ) tablet 50 mg  50 mg Oral Daily Ghimire, Kuber, MD   50 mg at 06/23/24 1009   levothyroxine  (SYNTHROID ) tablet 100 mcg  100 mcg Oral q morning Debby Camila A, MD   100 mcg at 06/23/24 0735   LORazepam  (ATIVAN ) injection 0.5 mg  0.5 mg Intravenous Q6H PRN Debby Camila A, MD   0.5 mg at 06/23/24 1820   micafungin  (MYCAMINE ) 150 mg in sodium chloride  0.9 % 100 mL IVPB  150 mg Intravenous Daily Fayette Bodily, MD 107.5 mL/hr at 06/23/24 1806 150 mg at 06/23/24 1806   OLANZapine  (ZYPREXA ) tablet 5 mg  5 mg Oral QHS Ghimire, Kuber, MD   5 mg at 06/23/24 2111   pantoprazole  (PROTONIX ) injection 40 mg  40 mg Intravenous Q12H Zehr, Jessica D, PA-C   40 mg at 06/23/24 2110   piperacillin -tazobactam (ZOSYN ) IVPB 3.375 g  3.375 g Intravenous Q8H Utomwen, Adesuwa, RPH 12.5 mL/hr at 06/23/24 1948 3.375 g at 06/23/24 1948   rosuvastatin  (CRESTOR ) tablet 10 mg  10 mg Oral Daily Thomas, Sara-Maiz A, MD   10 mg at 06/23/24 1009   sodium chloride  flush (NS) 0.9 % injection 10-40 mL  10-40 mL Intracatheter Q12H Opyd, Evalene RAMAN, MD   10 mL at 06/23/24 1012   sodium chloride  flush (NS) 0.9 % injection 10-40 mL  10-40 mL Intracatheter PRN Opyd, Evalene RAMAN, MD       topiramate  (TOPAMAX ) tablet 150 mg  150 mg Oral QHS Ghimire, Kuber, MD   150 mg at 06/23/24 2111   zolpidem  (AMBIEN ) tablet 5 mg  5 mg Oral QHS PRN Ghimire, Kuber, MD   5 mg at 06/23/24 2113     Abtx:  Anti-infectives (From admission, onward)    Start     Dose/Rate Route Frequency Ordered Stop   06/24/24 1500  ceFAZolin  (ANCEF ) IVPB 2g/100 mL premix        2 g 200 mL/hr over 30 Minutes Intravenous To Radiology 06/23/24 1632 06/25/24 1500   06/24/24 1000  fluconazole  (DIFLUCAN ) IVPB 400 mg  Status:  Discontinued        400 mg 100 mL/hr over 120 Minutes Intravenous Every 24 hours 06/23/24 1607 06/23/24 1627   06/23/24 1930  piperacillin -tazobactam (ZOSYN ) IVPB 3.375 g         3.375 g 12.5 mL/hr over 240 Minutes Intravenous Every 8 hours 06/23/24 1837     06/23/24 1715  micafungin  (MYCAMINE ) 150 mg in sodium chloride  0.9 % 100 mL IVPB        150 mg 107.5 mL/hr over 1 Hours Intravenous Daily 06/23/24 1627     06/23/24 1700  fluconazole  (DIFLUCAN ) IVPB 800 mg  Status:  Discontinued        800 mg 100 mL/hr over 240 Minutes Intravenous  Once 06/23/24  1607 06/23/24 1627       REVIEW OF SYSTEMS: given by husband Const: negative fever, negative chills,  Eyes: negative diplopia or visual changes, negative eye pain ZWU:ipqqprloub swallowing Resp: has a bovine cough worse after procedure Cards left sided chest pain GU: negative for frequency, dysuria and hematuria GI: Negative for abdominal pain, diarrhea, bleeding, constipation Skin:wound rt foot- slow healing Heme: negative for easy bruising and gum/nose bleeding MS: n weakness Neurolo:negative for headaches, dizziness, vertigo, memory problems  Psych: anxiety, depression  Endocrine: hypothyroidism Allergy/Immunology- see above Objective:  VITALS:  BP 131/74 (BP Location: Right Arm)   Pulse (!) 103   Temp 98.4 F (36.9 C) (Oral)   Resp 20   Ht 5' 4 (1.626 m)   Wt 75.2 kg   LMP  (LMP Unknown)   SpO2 95%   BMI 28.46 kg/m  LDA Left P{ICC PHYSICAL EXAM:  General: Awake , in resp distress Also has pain Head: Normocephalic, without obvious abnormality, atraumatic. Eyes: Conjunctivae clear, anicteric sclerae. Pupils are equal ENT Nares normal. No drainage or sinus tenderness.  Neck:  symmetrical, no adenopathy, thyroid : non tender no carotid bruit and no JVD. Lungs: b/l rhonchi heard Heart: Tachycardia Pulse ox 96% Abdomen: not examined in detail Edema of the extremities Wound covered with eschar with raised rolled margin  Skin: as above Lymph: Cervical, supraclavicular normal. Neurologic: Grossly non-focal Pertinent Labs Lab Results CBC    Component Value Date/Time   WBC 7.2 06/23/2024  1108   RBC 4.32 06/23/2024 1108   HGB 10.5 (L) 06/23/2024 1108   HGB 10.6 (L) 08/20/2022 1513   HCT 35.0 (L) 06/23/2024 1108   HCT 34.4 08/20/2022 1513   PLT 241 06/23/2024 1108   PLT 414 08/20/2022 1513   MCV 81.0 06/23/2024 1108   MCV 78 (L) 08/20/2022 1513   MCH 24.3 (L) 06/23/2024 1108   MCHC 30.0 06/23/2024 1108   RDW 23.9 (H) 06/23/2024 1108   RDW 16.5 (H) 08/20/2022 1513   LYMPHSABS 1.3 06/23/2024 1108   MONOABS 0.5 06/23/2024 1108   EOSABS 0.0 06/23/2024 1108   BASOSABS 0.0 06/23/2024 1108       Latest Ref Rng & Units 06/23/2024   11:08 AM 06/22/2024    3:31 AM 06/21/2024   10:00 PM  CMP  Glucose 70 - 99 mg/dL 878  835  889   BUN 6 - 20 mg/dL 15  20  26    Creatinine 0.44 - 1.00 mg/dL 8.94  8.56  8.43   Sodium 135 - 145 mmol/L 143  140  137   Potassium 3.5 - 5.1 mmol/L 4.2  3.2  3.6   Chloride 98 - 111 mmol/L 112  105  103   CO2 22 - 32 mmol/L 20  21  17    Calcium  8.9 - 10.3 mg/dL 8.3  8.1  8.3   Total Protein 6.5 - 8.1 g/dL 6.4  6.9    Total Bilirubin 0.0 - 1.2 mg/dL 0.2  <9.7    Alkaline Phos 38 - 126 U/L 75  79    AST 15 - 41 U/L 47  74    ALT 0 - 44 U/L 98  119        Microbiology: No results found for this or any previous visit (from the past 240 hours).  Lines and Device Date on insertion # of days DC  Central line     Foley     ETT       IMAGING RESULTS: Viewed  EGD images  I have personally reviewed the films ? Impression/Recommendation Recurrent esophageal candidiasis It could be because of the stenosis and strictures and Schatzki's ring.  There could be an element of GERD which puts her at risk for esophageal candidiasis She has been treated with multiple courses of fluconazole  though at a lower dose of 200 mg This time during the endoscopy  fungal culture and susceptibility has been sent.  This will tell us  whether it is albicans or not albicans species and also whether susceptible to Azoles  we will give her IV echinocandin Will get a new  EKG to look at the QTc and then can switch to fluconazole  at a higher dose of 400 mg daily  Acute respiratory distress and severe chest pain on the left side post procedure Need to rule out any perforation of esophagus and mediastinitis Chest x-ray has been ordered by the hospitalist And it shows a left whiteout Hospitalist started on Zosyn  for aspiration pneumonia    History of multiple malignancies All in remission CA breast status post bilateral mastectomies and reconstruction Thyroid  malignancy with thyroidectomy and now hypothyroid and on supplement History of Hodgkin's lymphoma   Adrenal insufficiency on hydrocortisone  This was secondary to pituitary adenoma removal  Anemia  AKI improving  Transaminitis  Nonhealing ulcer on the dorsum of the right foot covered with eschar is controlled margins.  Slightly raised This is not typical for pyoderma gangrenosum Ecthyma gangrenosum May need biopsy  History of multiple bacteremias related to port infection The port was removed a few months ago She now has a PICC line   This consult involved complex antimicrobial management _I have personally spent  -75--minutes involved in face-to-face and non-face-to-face activities for this patient on the day of the visit. Professional time spent includes the following activities: Preparing to see the patient (review of tests), Obtaining and/or reviewing separately obtained history (admission/discharge record), Performing a medically appropriate examination and/or evaluation , Ordering medications/tests/procedures, referring and communicating with other health care professionals, Documenting clinical information in the EMR, Independently interpreting results (not separately reported), Communicating results to the patient/family/caregiver, Counseling and educating the patient/family/caregiver and Care coordination (not separately reported).     ________________________________________________ Discussed with husband and hospitalist Note:  This document was prepared using Dragon voice recognition software and may include unintentional dictation errors.

## 2024-06-23 NOTE — Progress Notes (Addendum)
       Overnight   NAME: [name] MRN: [id number] DOB : 06/07/1971    Date of Service   06/23/2024   HPI/Events of Note    Notified by RN for fall. Concurrently notified by RN for procedure/OR refusal.  Brief history 53 year old female history of Hodgkin's lymphoma at age 44. Status post chemoradiation, breast cancer status postchemotherapy and bilateral mastectomy in remission, cervical cancer, ovarian tumor status post TAH and left oophorectomy in 2011, right in 2016, pulmonary arterial hypertension, chronic hypoxemic failure on 3 to 5 L oxygen  at home, Addison's disease, migraines.  Admitted through ER with nausea and vomiting, treated for pneumonia, also found to have esophageal candidiasis, 4 days of intractable nausea and vomiting.  Bedside visit Patient lying in bed in no obvious or stated distress, although somewhat anxious. Patient explains that while trying to go to the bathroom she slid down the side of the bed to the floor, adamant that she did not hit her head, recalls all event readily, has no other complaints, movement of extremities x 4 is equal.  No deformities, contusions, abrasions, punctures, bruising, tears, lacerations, swelling. Patient has not started aspirin  therapy yet.  Patient has no obvious injuries, no physical complaints. Awake and oriented x 4 with rapid reply to questions.   Interventions/ Plan   Post fall procedures. Fall arresting pads Bed alarms active.        Update 2211 hrs Patient has  slightly increased pain from earlier baseline complaints at admission. Will order Port arm due to prior event Imaging pending    Lynwood Kipper BSN MSNA MSN ACNPC-AG Acute Care Nurse Practitioner [facility]

## 2024-06-23 NOTE — Plan of Care (Signed)
   Problem: Activity: Goal: Risk for activity intolerance will decrease Outcome: Progressing   Problem: Nutrition: Goal: Adequate nutrition will be maintained Outcome: Progressing   Problem: Coping: Goal: Level of anxiety will decrease Outcome: Progressing

## 2024-06-23 NOTE — Transfer of Care (Signed)
 Immediate Anesthesia Transfer of Care Note  Patient: MIAKODA MCMILLION  Procedure(s) Performed: EGD (ESOPHAGOGASTRODUODENOSCOPY)  Patient Location: PACU and Endoscopy Unit  Anesthesia Type:MAC  Level of Consciousness: awake, alert , oriented, and patient cooperative  Airway & Oxygen  Therapy: Patient Spontanous Breathing and Patient connected to face mask oxygen   Post-op Assessment: Report given to RN, Post -op Vital signs reviewed and stable, and Patient moving all extremities  Post vital signs: Reviewed and stable  Last Vitals:  Vitals Value Taken Time  BP 168/77 06/23/24 14:20  Temp    Pulse 93 06/23/24 14:26  Resp 21 06/23/24 14:26  SpO2 92 % 06/23/24 14:26  Vitals shown include unfiled device data.  Last Pain:  Vitals:   06/23/24 1245  TempSrc: Temporal  PainSc: 7          Complications: No notable events documented.

## 2024-06-23 NOTE — Progress Notes (Signed)
 This nurse entered the patients room and informed pt about being NPO at midnight. Pt stated No, I am not doing that and I am eating at midnight. I do not care. I attempted to educate the patient on the importance of the NPO order and why there was an order for her being NPO. Patient still declined the NPO order and requested to speak with provider in the morning regarding procedure that requires being NPO.  Dr. James K. Blondie, was informed.

## 2024-06-23 NOTE — Progress Notes (Signed)
 PROGRESS NOTE    [name]  FMW:[id number] DOB: 1971/04/22 DOA: 06/21/2024 PCP: Toppin, Jean M, MD    Brief Narrative:  53 year old with history of Hodgkin's lymphoma at age 22 status post chemoradiation, breast cancer s/p chemotherapy and bilateral mastectomy in remission, cervical cancer, ovarian tumor status post TAH and left oophorectomy in 2011, right in 2016, pulmonary arterial hypertension, chronic hypoxemic failure on 3 to 5 L oxygen  at home, Addison's disease and history of migraine who was recently admitted with nausea vomiting treated for pneumonia and also found to have esophageal candidiasis presented with about 4 days of intractable nausea vomiting, unable to take any medications, abdominal discomfort. In the emergency room hemodynamically stable.  Hemoglobin 12.2.  Lipase 62.  Potassium 2.6 and magnesium  1.9.  Chest x-ray with airspace disease in the retrocardiac region on the left, atelectasis.  CT angiogram with no acute intra-abdominal process.  Bilateral nephrolithiasis without evidence of obstruction.  Complex lesion in the lower pole of the left kidney with indeterminate imaging characters.  Patient was given Solu-Cortef , lorazepam , potassium and Benadryl  and admitted to the hospital due to significant symptoms.  Patient stated not able to eat or drink for a long time as well unable to have meaningful water  intake.  Subjective: Patient seen and examined.  Tired and sleepy today.  Early morning hours she was confused but was easily reoriented. For upper GI endoscopy today.  Patient for PEG tube placement tomorrow. Morning labs are pending.  Assessment & Plan:   Intractable nausea and vomiting: History of esophageal stricture and candidiasis.  Could not complete Diflucan  therapy. Continue maintenance IV fluids with potassium, using Benadryl  and Ativan  for nausea as she is allergic to other nausea medications.  Replace electrolytes. For upper GI endoscopy today.  Currently  on Protonix  continued.  Addison's disease: Currently no evidence of exacerbation.  Patient on Solu-Cortef  at home.  Currently remains on a stress dose of steroids.  Anxiety and depression: Significant symptoms.  Patient on BuSpar, Lamictal , Klonopin .  Continue this.  She also takes Topamax  for prevention of migraine.  Hypokalemia: Unable to take oral medications.  Keeping IV potassium.  Recheck and monitor levels.  Morning labs pending.  Failure to thrive, esophageal candidiasis: ID to consult regarding candidiasis. Patient with recurrent dehydration and inadequate oral intake, recommended PEG tube feeding, IR consulted and scheduled for tomorrow.    DVT prophylaxis: SCDs   Code Status: Full code Family Communication: None at the bedside Disposition Plan: Status is: Inpatient Remains inpatient appropriate because: Significant symptoms, unable to eat     Consultants:  Gastroenterology IR  Procedures:  None  Antimicrobials:  None     Objective: Vitals:   06/22/24 0836 06/22/24 1439 06/22/24 2038 06/23/24 0456  BP: 121/77 112/67 131/79 (!) 145/75  Pulse: 83 89 89 86  Resp:  16 18 18   Temp: 97.8 F (36.6 C) 97.7 F (36.5 C) 97.9 F (36.6 C) 98 F (36.7 C)  TempSrc: Oral Oral Oral   SpO2: 100% 100% 100% 100%  Weight:      Height:        Intake/Output Summary (Last 24 hours) at 06/23/2024 1054 Last data filed at 06/23/2024 1012 Gross per 24 hour  Intake 1661.05 ml  Output 650 ml  Net 1011.05 ml   Filed Weights   06/21/24 1955 06/21/24 2103  Weight: 75.2 kg 75.2 kg    Examination:  General exam: Appears calm and sleepy today. Respiratory system: Clear to auscultation. Respiratory effort normal. Cardiovascular  system: S1 & S2 heard, RRR. No JVD, murmurs, rubs, gallops or clicks. No pedal edema. Gastrointestinal system: Abdomen is nondistended, soft and nontender. No organomegaly or masses felt. Normal bowel sounds heard. Central nervous system: Alert and  oriented. No focal neurological deficits. Extremities: Symmetric 5 x 5 power. Skin: Patient has healing wound right dorsum of the foot with eschar.  Picture in the chart.    Data Reviewed: I have personally reviewed following labs and imaging studies  CBC: Recent Labs  Lab 06/21/24 1701 06/21/24 2200 06/22/24 0331  WBC 7.2 5.7 5.1  NEUTROABS 3.9  --   --   HGB 12.2 12.0 10.5*  HCT 38.5 42.2 34.0*  MCV 76.5* 82.4 78.7*  PLT 271 222 209   Basic Metabolic Panel: Recent Labs  Lab 06/21/24 1701 06/21/24 2200 06/22/24 0331  NA 139 137 140  K 2.6* 3.6 3.2*  CL 98 103 105  CO2 23 17* 21*  GLUCOSE 129* 110* 164*  BUN 27* 26* 20  CREATININE 1.93* 1.56* 1.43*  CALCIUM  9.6 8.3* 8.1*  MG 1.9 2.2  --   PHOS  --  2.8  --    GFR: Estimated Creatinine Clearance: 45.2 mL/min (A) (by C-G formula based on SCr of 1.43 mg/dL (H)). Liver Function Tests: Recent Labs  Lab 06/21/24 1701 06/22/24 0331  AST 79* 74*  ALT 136* 119*  ALKPHOS 99 79  BILITOT 0.4 <0.2  PROT 8.5* 6.9  ALBUMIN 4.8 4.0   Recent Labs  Lab 06/21/24 1701  LIPASE 62*   No results for input(s): AMMONIA in the last 168 hours. Coagulation Profile: No results for input(s): INR, PROTIME in the last 168 hours. Cardiac Enzymes: No results for input(s): CKTOTAL, CKMB, CKMBINDEX, TROPONINI in the last 168 hours. BNP (last 3 results) No results for input(s): PROBNP in the last 8760 hours. HbA1C: No results for input(s): HGBA1C in the last 72 hours. CBG: No results for input(s): GLUCAP in the last 168 hours. Lipid Profile: No results for input(s): CHOL, HDL, LDLCALC, TRIG, CHOLHDL, LDLDIRECT in the last 72 hours. Thyroid  Function Tests: No results for input(s): TSH, T4TOTAL, FREET4, T3FREE, THYROIDAB in the last 72 hours. Anemia Panel: No results for input(s): VITAMINB12, FOLATE, FERRITIN, TIBC, IRON, RETICCTPCT in the last 72 hours. Sepsis Labs: Recent  Labs  Lab 06/21/24 2200 06/22/24 0330  PROCALCITON <0.10  --   LATICACIDVEN  --  0.8    No results found for this or any previous visit (from the past 240 hours).       Radiology Studies: DG Chest 2 View Result Date: 06/21/2024 CLINICAL DATA:  Nausea and vomiting. EXAM: CHEST - 2 VIEW COMPARISON:  05/18/2024. FINDINGS: The heart size and mediastinal contours are within normal limits. There is atherosclerotic calcification of the aorta. Focal airspace disease is noted in the retrocardiac region on the left. No effusion or pneumothorax is seen. A left PICC line appear stable. Surgical clips are noted in the chest walls bilaterally. No acute osseous abnormality. IMPRESSION: Airspace disease in the retrocardiac region on the left, possible residual pneumonia. Electronically Signed   By: Leita Birmingham M.D.   On: 06/21/2024 18:36   CT ABDOMEN PELVIS WO CONTRAST Result Date: 06/21/2024 CLINICAL DATA:  Left flank pain with vomiting. EXAM: CT ABDOMEN AND PELVIS WITHOUT CONTRAST TECHNIQUE: Multidetector CT imaging of the abdomen and pelvis was performed following the standard protocol without IV contrast. RADIATION DOSE REDUCTION: This exam was performed according to the departmental dose-optimization program which includes automated  exposure control, adjustment of the mA and/or kV according to patient size and/or use of iterative reconstruction technique. COMPARISON:  01/23/2023. FINDINGS: Lower chest: Atelectasis is noted at the lung bases. Hepatobiliary: Stable scattered subcentimeter hypodensities are noted in the liver, statistically most likely representing cysts or hemangiomas. No biliary ductal dilatation. The gallbladder is without stones. Pancreas: Unremarkable. No pancreatic ductal dilatation or surrounding inflammatory changes. Spleen: Normal in size without focal abnormality. Adrenals/Urinary Tract: The right adrenal gland is within normal limits. There is a 9 mm nodule in the left adrenal  gland. No additional imaging is required at this time. There are subcentimeter hypodensities in the kidneys, possible cysts. An indeterminate exophytic lesion is noted in the left kidney measuring 1.3 cm. Renal calculi are present bilaterally. No obstructive uropathy is seen. The bladder is unremarkable. Stomach/Bowel: The stomach is within normal limits. No bowel obstruction, free air, or pneumatosis is seen. A moderate amount of retained stool is present in the colon. Appendix is surgically absent. Vascular/Lymphatic: Aortic atherosclerosis. No enlarged abdominal or pelvic lymph nodes. Reproductive: Status post hysterectomy. No adnexal masses. Other: No abdominopelvic ascites. A fat containing umbilical hernia is noted. There are bilateral breast implants. Musculoskeletal: Degenerative changes are present in the thoracolumbar spine. No acute osseous abnormality. IMPRESSION: 1. No acute intra-abdominal process. 2. Bilateral nephrolithiasis without evidence of obstruction. 3. Complex lesion in in the lower pole of the left kidney with indeterminate imaging characteristics. Ultrasound or MRI is recommended for further characterization on follow-up. 4. Moderate amount of retained stool in the colon suggesting constipation. 5. Aortic atherosclerosis. Electronically Signed   By: Leita Birmingham M.D.   On: 06/21/2024 18:33        Scheduled Meds:  [START ON 06/25/2024] aspirin  EC  81 mg Oral Daily   buPROPion   150 mg Oral Daily   Chlorhexidine  Gluconate Cloth  6 each Topical Daily   feeding supplement  1 Container Oral TID BM   FLUoxetine   40 mg Oral Daily   hydrocortisone  sod succinate (SOLU-CORTEF ) inj  100 mg Intravenous Q8H   hydrOXYzine   25 mg Oral TID   lamoTRIgine   200 mg Oral QHS   lamoTRIgine   50 mg Oral Daily   levothyroxine   100 mcg Oral q morning   OLANZapine   5 mg Oral QHS   pantoprazole  (PROTONIX ) IV  40 mg Intravenous Q12H   rosuvastatin   10 mg Oral Daily   sodium chloride  flush  10-40 mL  Intracatheter Q12H   topiramate   150 mg Oral QHS   Continuous Infusions:  sodium chloride      dextrose  5 % and 0.9 % NaCl with KCl 40 mEq/L 100 mL/hr at 06/23/24 0341     LOS: 2 days    Time spent: 40 minutes    Renato Applebaum, MD [facility]

## 2024-06-23 NOTE — Op Note (Signed)
 [facility] Patient Name: [name] Procedure Date: 06/23/2024 MRN: [id number] Attending MD: Lynnie Bring , MD, [id number] Date of Birth: [birth year] CSN: [id number] Age: 53 Admit Type: Inpatient Procedure:                Upper GI endoscopy Indications:              1. GERD with HH. 2. 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 3. Recurrent eso                            candidiasis-likely exacerbated by                            steroids/antibiotics. 4. N/V/failure to                            thrive/recurrent dehydration with significant                            component of oropharyngeal dysphagia on MBS. No                            aspiration-being considered for PEG as outpt (for                            feeds/fluids/meds). Neg CT AP without contrast 9/22                            except for constipation. Providers:                Lynnie Bring, MD, Darleene Bare, RN, Haskel Chris,                            Technician Referring MD:              Medicines:                Monitored Anesthesia Care Complications:            No immediate complications. Estimated Blood Loss:     Estimated blood loss was minimal. Procedure:                Pre-Anesthesia Assessment:                           - Prior to the procedure, a History and Physical                            was performed, and patient medications and                            allergies were reviewed. The patient's tolerance of  previous anesthesia was also reviewed. The risks                            and benefits of the procedure and the sedation                            options and risks were discussed with the patient.                            All questions were answered, and informed consent                            was obtained. Prior Anticoagulants:  The patient has                            taken no anticoagulant or antiplatelet agents. ASA                            Grade Assessment: IV - A patient with severe                            systemic disease that is a constant threat to life.                            After reviewing the risks and benefits, the patient                            was deemed in satisfactory condition to undergo the                            procedure.                           After obtaining informed consent, the endoscope was                            passed under direct vision. Throughout the                            procedure, the patient's blood pressure, pulse, and                            oxygen  saturations were monitored continuously. The                            [id number]-H190 ([id number]) Olympus endoscope was introduced                            through the mouth, and advanced to the second part                            of duodenum. The upper GI endoscopy was  accomplished without difficulty. The patient                            tolerated the procedure well. Scope In: Scope Out: Findings:      One benign-appearing, intrinsic mild stenosis was found 16 cm from the       incisors. This stenosis measured 1.2 cm (inner diameter) x 1 cm (in       length). The stenosis was traversed. A TTS dilator was passed through       the scope. Dilation with a 15-16.5-18 mm balloon dilator was performed       to 18 mm. The dilation site was examined and showed moderate mucosal       disruption and complete resolution of luminal narrowing. Estimated blood       loss was minimal. We were able to bring out inflated 15 mm balloon       without any problems.      Few patchy, yellow plaques were found in the middle third of the       esophagus. First Bx was sent for fungal culture sensitivity in normal       saline (as recommended by ID). Multiple biopsies were taken with a cold        forceps for histology.      A widely patent incomplete Schatzki ring was found at the       gastroesophageal junction, 38 cm from the incisors.      A 2 cm hiatal hernia was present.      Localized mild inflammation characterized by erythema was found in the       gastric antrum. Biopsies were taken with a cold forceps for histology.      Multiple 6 to 8 mm semi-sessile polyps with no bleeding and no stigmata       of recent bleeding were found in the gastric body. Not biopsied as       previous biopsies 03/2024 were consistent with fundic gland polyps.      The examined duodenum was normal. Biopsies for histology were taken with       a cold forceps for evaluation of celiac disease. Impression:               - Benign-appearing esophageal stenosis. Dilated.                           - Esophageal plaques were found, suspicious for                            candidiasis. Biopsied.                           - Widely patent Schatzki ring.                           - 2 cm hiatal hernia.                           - Gastritis. Biopsied.                           - Multiple gastric polyps.                           -  Normal examined duodenum. Biopsied. Moderate Sedation:      Not Applicable - Patient had care per Anesthesia. Recommendation:           - Return patient to hospital ward for ongoing care.                           - Soft diet today                           - Continue present medications.                           - Await pathology results.                           - Follow fungal culture sensitivity.                           - Pt would like to proceed with PEG for                            feeding/meds/fluids as recommended by Dr                            San. IR consult in AM                           - She is also scheduled for esophageal manometry as                            outpatient                           - The findings and recommendations were discussed                             with the patient's family. Procedure Code(s):        --- Professional ---                           [phone], Esophagogastroduodenoscopy, flexible,                            transoral; with transendoscopic balloon dilation of                            esophagus (less than 30 mm diameter)                           43239, 59, Esophagogastroduodenoscopy, flexible,                            transoral; with biopsy, single or multiple Diagnosis Code(s):        --- Professional ---                           K22.2, Esophageal obstruction  K22.9, Disease of esophagus, unspecified                           K44.9, Diaphragmatic hernia without obstruction or                            gangrene                           K29.70, Gastritis, unspecified, without bleeding                           K31.7, Polyp of stomach and duodenum                           R13.10, Dysphagia, unspecified CPT copyright 2022 [employer]. All rights reserved. The codes documented in this report are preliminary and upon coder review may  be revised to meet current compliance requirements. Lynnie Bring, MD 06/23/2024 2:22:18 PM This report has been signed electronically. Number of Addenda: 0

## 2024-06-23 NOTE — Anesthesia Procedure Notes (Signed)
 Procedure Name: MAC Date/Time: 06/23/2024 1:30 PM  Performed by: Memory Armida LABOR, CRNAPre-anesthesia Checklist: Patient identified, Emergency Drugs available, Suction available, Patient being monitored and Timeout performed Patient Re-evaluated:Patient Re-evaluated prior to induction Oxygen  Delivery Method: Simple face mask Placement Confirmation: positive ETCO2 Dental Injury: Teeth and Oropharynx as per pre-operative assessment

## 2024-06-23 NOTE — Plan of Care (Signed)
   Problem: Education: Goal: Knowledge of General Education information will improve Description: Including pain rating scale, medication(s)/side effects and non-pharmacologic comfort measures Outcome: Progressing   Problem: Elimination: Goal: Will not experience complications related to bowel motility Outcome: Progressing Goal: Will not experience complications related to urinary retention Outcome: Progressing

## 2024-06-23 NOTE — Anesthesia Postprocedure Evaluation (Signed)
 Anesthesia Post Note  Patient: [name]  Procedure(s) Performed: EGD (ESOPHAGOGASTRODUODENOSCOPY)     Patient location during evaluation: PACU Anesthesia Type: MAC Level of consciousness: awake and alert Pain management: pain level controlled Vital Signs Assessment: post-procedure vital signs reviewed and stable Respiratory status: spontaneous breathing, nonlabored ventilation, respiratory function stable and patient connected to nasal cannula oxygen  Cardiovascular status: stable and blood pressure returned to baseline Postop Assessment: no apparent nausea or vomiting Anesthetic complications: no   No notable events documented.  Last Vitals:  Vitals:   06/23/24 1450 06/23/24 1455  BP: (!) 143/77   Pulse: 92 92  Resp: 20 17  Temp:    SpO2: 94% 98%    Last Pain:  Vitals:   06/23/24 1450  TempSrc:   PainSc: 3                  Garnette FORBES Skillern

## 2024-06-23 NOTE — Progress Notes (Signed)
 Patient was found on the floor by Nurse Tech/ Unwitnessed fall. Patient was not wearing any Nonskid socks because the patient previously refused them and wanted to wear her own socks brought from home. The patient was found on the fall with her head laying on her pillow and blanket in between her legs. Patient stated she was trying to get to the bathroom and she slipped. Patient stated that she did not hit her head. The patient was then sat up on the bed and assessed. The provider on call, Lynwood POUR. Blondie was informed via Secure Chat and the patient was sat back in the bed and the Bed Alarm was put on. The patient was educated on putting Nonskid socks on which she refused again.   Provider came in and assessed patient at bedside. Patient was lying in bed and was stating that her previous left sided shoulder pain had intensified. This nurse administered Flexeril . The patient had her oxygen  adjusted as well. Safety protocol initiated: Fall bracelet, Bed alarm, Floor pad, Bed in the lowest position, and call light within reach.  Post-Fall huddle has been completed, accurate documentation, and safety zone portal will follow.

## 2024-06-23 NOTE — Progress Notes (Signed)
 Pharmacy: [facility]  [phone] YOF with EGD today showing esophageal plaques concerning for candidiasis. Fungal cultures were sent.   The patient was just prescribed a 21d fluconazole  course on 8/27 - at a dose of 400 mg x 1 then 200 mg daily. Estimated completion on 9/16  QTc noted to be elevated on admit at 519 however prior to that in June/August - checks were more in the 470-480 range. Discussed with Dr. Fayette and will initiate treatment with micafungin  for today, recheck a QTc, and consider transition to fluconazole  at that time.   Plan - Micafungin  150 mg IV every 24 hours for now - Will plan to recheck an EKG to monitor QTc trends for transition to fluconazole   Thank you for allowing pharmacy to be a part of this patient's care.  Almarie Lunger, PharmD, BCPS, BCIDP Infectious Diseases Clinical Pharmacist 06/23/2024 4:30 PM   **Pharmacist phone directory can now be found on [email or web address] ([email or web address]).  Listed under [facility].

## 2024-06-23 NOTE — Progress Notes (Signed)
 Patient woke up startled. Walked out into the hallway emotional and confused and disoriented. Patient called husband and that seemed to calm her down a bit. Patient stated that this has been happening more and more where she wakes up disoriented and confused. Patient calmly resting In bed but still slightly disoriented. DOROTHA Kipper, NP was made aware of this to pass along as well.

## 2024-06-23 NOTE — Significant Event (Signed)
 Patient woke up with severe left sided chest pain, later chest wall , cough and wheezing. On exam, very anxious- on severe pain. On room air . Vitals stable. She was given a dose of fentanyl  and nebs with some relief.  A chest x ray shows large area of consolidation/ white out left lung. Possibly aspirated during the procedure.   Starting on Zosyn   Chest physio, flutter valve , incentive spirometer  Sputum cultures  Will repeat chest x ray or CT chest if worsening pain and SOB.   Updated husband at the bedside .

## 2024-06-23 NOTE — Interval H&P Note (Signed)
 History and Physical Interval Note:  06/23/2024 1:01 PM  [name]  has presented today for surgery, with the diagnosis of Nausea, vomiting, dysphagia, history of stricture.  The various methods of treatment have been discussed with the patient and family. After consideration of risks, benefits and other options for treatment, the patient has consented to  Procedure(s): EGD (ESOPHAGOGASTRODUODENOSCOPY) (N/A) as a surgical intervention.  The patient's history has been reviewed, patient examined, no change in status, stable for surgery.  I have reviewed the patient's chart and labs.  Questions were answered to the patient's satisfaction.     Lynnie Bring

## 2024-06-24 ENCOUNTER — Inpatient Hospital Stay (HOSPITAL_COMMUNITY)

## 2024-06-24 ENCOUNTER — Other Ambulatory Visit: Payer: Self-pay | Admitting: Adult Health

## 2024-06-24 ENCOUNTER — Encounter (HOSPITAL_COMMUNITY): Payer: Self-pay | Admitting: Gastroenterology

## 2024-06-24 DIAGNOSIS — N179 Acute kidney failure, unspecified: Secondary | ICD-10-CM | POA: Diagnosis not present

## 2024-06-24 DIAGNOSIS — E2740 Unspecified adrenocortical insufficiency: Secondary | ICD-10-CM

## 2024-06-24 DIAGNOSIS — F331 Major depressive disorder, recurrent, moderate: Secondary | ICD-10-CM

## 2024-06-24 LAB — BLOOD GAS, ARTERIAL
Acid-base deficit: 4.7 mmol/L — ABNORMAL HIGH (ref 0.0–2.0)
Bicarbonate: 19 mmol/L — ABNORMAL LOW (ref 20.0–28.0)
Drawn by: 25770
O2 Content: 15 L/min
O2 Saturation: 99.8 %
Patient temperature: 37.3
pCO2 arterial: 30 mmHg — ABNORMAL LOW (ref 32–48)
pH, Arterial: 7.41 (ref 7.35–7.45)
pO2, Arterial: 91 mmHg (ref 83–108)

## 2024-06-24 LAB — SURGICAL PATHOLOGY

## 2024-06-24 LAB — RESPIRATORY PANEL BY PCR

## 2024-06-24 LAB — GLUCOSE, CAPILLARY: Glucose-Capillary: 116 mg/dL — ABNORMAL HIGH (ref 70–99)

## 2024-06-24 LAB — MRSA NEXT GEN BY PCR, NASAL: MRSA by PCR Next Gen: NOT DETECTED

## 2024-06-24 MED ORDER — OXYCODONE HCL 5 MG/5ML PO SOLN
5.0000 mg | ORAL | Status: DC | PRN
  Administered 2024-06-24 (×2): 5 mg via ORAL
  Filled 2024-06-24 (×3): qty 5

## 2024-06-24 MED ORDER — ORAL CARE MOUTH RINSE: 15.0000 mL | OROMUCOSAL | Status: DC | PRN

## 2024-06-24 MED ORDER — POTASSIUM PHOSPHATES 15 MMOLE/5ML IV SOLN
20.0000 mmol | Freq: Once | INTRAVENOUS | Status: AC
  Administered 2024-06-24: 20 mmol via INTRAVENOUS
  Filled 2024-06-24: qty 6.67

## 2024-06-24 MED ORDER — KCL IN DEXTROSE-NACL 40-5-0.9 MEQ/L-%-% IV SOLN
INTRAVENOUS | Status: DC
  Filled 2024-06-24: qty 1000

## 2024-06-24 MED ORDER — ALBUTEROL SULFATE (2.5 MG/3ML) 0.083% IN NEBU
2.5000 mg | INHALATION_SOLUTION | Freq: Four times a day (QID) | RESPIRATORY_TRACT | Status: DC
  Administered 2024-06-24 – 2024-06-27 (×12): 2.5 mg via RESPIRATORY_TRACT
  Filled 2024-06-24 (×13): qty 3

## 2024-06-24 MED ORDER — FENTANYL CITRATE PF 50 MCG/ML IJ SOSY
12.5000 ug | PREFILLED_SYRINGE | INTRAMUSCULAR | Status: DC | PRN
  Administered 2024-06-24 – 2024-06-25 (×3): 12.5 ug via INTRAVENOUS
  Filled 2024-06-24 (×3): qty 1

## 2024-06-24 MED ORDER — FUROSEMIDE 10 MG/ML IJ SOLN
INTRAMUSCULAR | Status: AC
  Filled 2024-06-24: qty 4

## 2024-06-24 MED ORDER — BUDESONIDE 0.25 MG/2ML IN SUSP
0.2500 mg | Freq: Two times a day (BID) | RESPIRATORY_TRACT | Status: DC
Start: 2024-06-24 — End: 2024-06-27
  Administered 2024-06-24 – 2024-06-27 (×7): 0.25 mg via RESPIRATORY_TRACT
  Filled 2024-06-24 (×7): qty 2

## 2024-06-24 MED ORDER — FUROSEMIDE 10 MG/ML IJ SOLN
40.0000 mg | Freq: Once | INTRAMUSCULAR | Status: AC
  Administered 2024-06-24: 40 mg via INTRAVENOUS

## 2024-06-24 MED ORDER — TIOTROPIUM BROMIDE MONOHYDRATE 1.25 MCG/ACT IN AERS: 2.0000 | INHALATION_SPRAY | Freq: Every day | RESPIRATORY_TRACT | Status: DC

## 2024-06-24 MED ORDER — UMECLIDINIUM BROMIDE 62.5 MCG/ACT IN AEPB
1.0000 | INHALATION_SPRAY | Freq: Every day | RESPIRATORY_TRACT | Status: DC
  Administered 2024-06-24 – 2024-06-27 (×2): 1 via RESPIRATORY_TRACT
  Filled 2024-06-24: qty 7

## 2024-06-24 NOTE — Telephone Encounter (Signed)
 Patient is still currently admitted. Will await further communication from patient.

## 2024-06-24 NOTE — Progress Notes (Signed)
 Date of Admission:  06/21/2024    ID: [name] is a 53 y.o. female  Principal Problem:   AKI (acute kidney injury)    Subjective: Pt is still having pain left side of chest which is better controlled with fentanyl  and oral opioids Fall last night Medications:   albuterol   2.5 mg Nebulization Q6H   [START ON 06/25/2024] aspirin  EC  81 mg Oral Daily   budesonide  (PULMICORT ) nebulizer solution  0.25 mg Nebulization BID   buPROPion   150 mg Oral Daily   Chlorhexidine  Gluconate Cloth  6 each Topical Daily   feeding supplement  1 Container Oral TID BM   FLUoxetine   40 mg Oral Daily   hydrocortisone  sod succinate (SOLU-CORTEF ) inj  100 mg Intravenous Q8H   lamoTRIgine   200 mg Oral QHS   lamoTRIgine   50 mg Oral Daily   levothyroxine   100 mcg Oral q morning   OLANZapine   5 mg Oral QHS   pantoprazole  (PROTONIX ) IV  40 mg Intravenous Q12H   rosuvastatin   10 mg Oral Daily   sodium chloride  flush  10-40 mL Intracatheter Q12H   topiramate   150 mg Oral QHS   umeclidinium bromide   1 puff Inhalation Daily    Objective: Vital signs in last 24 hours: Patient Vitals for the past 24 hrs:  BP Temp Temp src Pulse Resp SpO2  06/24/24 0800 113/67 98.8 F (37.1 C) Oral 92 -- 98 %  06/24/24 0603 (!) 99/51 98 F (36.7 C) Oral 99 18 97 %  06/24/24 0437 -- -- -- -- -- 99 %  06/23/24 2321 113/65 98.7 F (37.1 C) Oral (!) 105 -- 98 %  06/23/24 2211 -- -- -- -- -- 98 %  06/23/24 2110 131/74 98.4 F (36.9 C) Oral (!) 103 20 95 %  06/23/24 1747 -- -- -- -- -- 97 %  06/23/24 1533 122/66 -- -- 92 -- 94 %  06/23/24 1455 -- -- -- 92 17 98 %  06/23/24 1450 (!) 143/77 -- -- 92 20 94 %  06/23/24 1445 -- -- -- 93 (!) 21 91 %  06/23/24 1440 (!) 131/55 -- -- 92 (!) 24 (!) 87 %  06/23/24 1435 -- -- -- 94 (!) 23 91 %  06/23/24 1430 132/78 97.7 F (36.5 C) -- 94 20 (!) 88 %  06/23/24 1425 -- -- -- 94 (!) 25 (!) 88 %  06/23/24 1420 (!) 168/77 -- -- 97 (!) 22 (!) 89 %  06/23/24 1245 (!) 159/93 97.8 F  (36.6 C) Temporal 85 16 98 %       PHYSICAL EXAM:  General: Alert, cooperative, some distress, appears stated age.  Lungs: b/l  rhonchi Heart: Regular rate and rhythm, no murmur, rub or gallop. Abdomen: Soft, non-tender,not distended. Bowel sounds normal. No masses Extremities: atraumatic, no cyanosis. No edema. No clubbing Skin: No rashes or lesions. Or bruising Lymph: Cervical, supraclavicular normal. Neurologic: Grossly non-focal  Lab Results    Latest Ref Rng & Units 06/23/2024   11:08 AM 06/22/2024    3:31 AM 06/21/2024   10:00 PM  CBC  WBC 4.0 - 10.5 K/uL 7.2  5.1  5.7   Hemoglobin 12.0 - 15.0 g/dL 89.4  89.4  87.9   Hematocrit 36.0 - 46.0 % 35.0  34.0  42.2   Platelets 150 - 400 K/uL 241  209  222        Latest Ref Rng & Units 06/23/2024   11:08 AM 06/22/2024  3:31 AM 06/21/2024   10:00 PM  CMP  Glucose 70 - 99 mg/dL 878  835  889   BUN 6 - 20 mg/dL 15  20  26    Creatinine 0.44 - 1.00 mg/dL 8.94  8.56  8.43   Sodium 135 - 145 mmol/L 143  140  137   Potassium 3.5 - 5.1 mmol/L 4.2  3.2  3.6   Chloride 98 - 111 mmol/L 112  105  103   CO2 22 - 32 mmol/L 20  21  17    Calcium  8.9 - 10.3 mg/dL 8.3  8.1  8.3   Total Protein 6.5 - 8.1 g/dL 6.4  6.9    Total Bilirubin 0.0 - 1.2 mg/dL 0.2  <9.7    Alkaline Phos 38 - 126 U/L 75  79    AST 15 - 41 U/L 47  74    ALT 0 - 44 U/L 98  119        Microbiology:  Studies/Results:  B/l infiltrate lung  DG Shoulder Left Port Result Date: 06/23/2024 CLINICAL DATA:  Fall, pain EXAM: LEFT SHOULDER COMPARISON:  None Available. FINDINGS: Degenerative changes in the [facility] joint with joint space narrowing and spurring. Glenohumeral joint is maintained. No acute bony abnormality. Specifically, no fracture, subluxation, or dislocation. Soft tissues are intact. Left PICC line in place with the tip in the SVC. Diffuse airspace disease throughout the visualized left lung. IMPRESSION: Mild degenerative changes in the left AC joint. No acute bony  abnormality. Diffuse airspace disease throughout the visualized left lung. Electronically Signed   By: Franky Crease M.D.   On: 06/23/2024 22:43   DG CHEST PORT 1 VIEW Result Date: 06/23/2024 CLINICAL DATA:  Chest pain EXAM: PORTABLE CHEST 1 VIEW COMPARISON:  06/21/2024 FINDINGS: Left PICC line in place with the tip in the SVC. Interval placement of right PICC line with the tip in the upper SVC at the confluence of the innominate veins. Heart mediastinal contours within normal limits. Marked worsening of diffuse left lung airspace disease and right perihilar opacities most compatible with pneumonia. No visible effusions or pneumothorax. IMPRESSION: Right PICC line placement with the tip in the upper SVC at the confluence of the innominate veins. Marked worsening in left lung airspace disease and right perihilar opacities concerning for pneumonia. Electronically Signed   By: Franky Crease M.D.   On: 06/23/2024 18:42     Assessment/Plan:  Recurrent esophageal candidiasis It could be because of the stenosis and strictures and Schatzki's ring.  There could be an element of GERD which puts her at risk for esophageal candidiasis She has been treated with multiple courses of fluconazole  though at a lower dose of 200 mg This time during the endoscopy  fungal culture and susceptibility has been sent.  This will tell us  whether it is albicans or not albicans species and also whether susceptible to Azoles  On IV echinocandin  new EKG  QTc okay   can switch to fluconazole  at a higher dose of 400 mg  after 2-3 days   Acute respiratory distress and severe chest pain on the left side post procedure Severe aspiration pneumonia b/l With underlying b/l lower lobe infiltrate No esophageal perforation  or mediastinitis  on Zosyn  for aspiration pneumonia  Resp panel PCR neg Sputum culture pending     History of multiple malignancies All in remission CA breast status post bilateral mastectomies and  reconstruction Thyroid  malignancy with thyroidectomy and now hypothyroid and on supplement History of  Hodgkin's lymphoma     Adrenal insufficiency on hydrocortisone  This was secondary to pituitary adenoma removal   Anemia   AKI improving   Transaminitis   Nonhealing ulcer on the dorsum of the right foot covered with eschar is controlled margins.  Slightly raised This is not typical for pyoderma gangrenosum Ecthyma gangrenosum May need biopsy   History of multiple bacteremias related to port infection The port was removed a few months ago She now has a PICC line   Discussed the management with patient and her husband

## 2024-06-24 NOTE — Plan of Care (Signed)
  Problem: Activity: Goal: Risk for activity intolerance will decrease Outcome: Progressing   Problem: Nutrition: Goal: Adequate nutrition will be maintained Outcome: Progressing   Problem: Coping: Goal: Level of anxiety will decrease Outcome: Progressing   Problem: Pain Managment: Goal: General experience of comfort will improve and/or be controlled Outcome: Progressing   Problem: Safety: Goal: Ability to remain free from injury will improve Outcome: Progressing   Problem: Skin Integrity: Goal: Risk for impaired skin integrity will decrease Outcome: Progressing

## 2024-06-24 NOTE — Plan of Care (Signed)
  Problem: Clinical Measurements: Goal: Diagnostic test results will improve Outcome: Not Progressing Goal: Respiratory complications will improve Outcome: Not Progressing Goal: Cardiovascular complication will be avoided Outcome: Not Progressing   Problem: Activity: Goal: Risk for activity intolerance will decrease Outcome: Not Progressing

## 2024-06-24 NOTE — Progress Notes (Signed)
Notified Lab that ABG being sent for analysis. 

## 2024-06-24 NOTE — Progress Notes (Addendum)
 [facility] Progress Note  CC:  N/V, dysphagia   Subjective:  Had aspiration during EGD yesterday so having chest pain and SOB.  Have cancelled her G-tube for now.  Tolerating some liquids and pills by mouth so far.  Husband at bedside.  Objective:  Vital signs in last 24 hours: Temp:  [97.7 F (36.5 C)-98.8 F (37.1 C)] 98.8 F (37.1 C) (09/24 0800) Pulse Rate:  [85-105] 92 (09/24 0800) Resp:  [16-25] 18 (09/24 0603) BP: (99-168)/(51-93) 113/67 (09/24 0800) SpO2:  [87 %-99 %] 98 % (09/24 0800) Last BM Date : 06/21/24 General:  Alert, appears SOB, tearful Heart:  Regular rate and rhythm; no murmurs Pulm:  Seem clear anteriorly.  Increased WOB. Abdomen:  Soft, non-distended.  BS present.  Non-tender.  Intake/Output from previous day: 09/23 0701 - 09/24 0700 In: 1695.8 [I.V.:1627.6; IV Piggyback:68.2] Out: 1500 [Urine:1500]  Lab Results: Recent Labs    06/21/24 2200 06/22/24 0331 06/23/24 1108  WBC 5.7 5.1 7.2  HGB 12.0 10.5* 10.5*  HCT 42.2 34.0* 35.0*  PLT 222 209 241   BMET Recent Labs    06/21/24 2200 06/22/24 0331 06/23/24 1108  NA 137 140 143  K 3.6 3.2* 4.2  CL 103 105 112*  CO2 17* 21* 20*  GLUCOSE 110* 164* 121*  BUN 26* 20 15  CREATININE 1.56* 1.43* 1.05*  CALCIUM  8.3* 8.1* 8.3*   LFT Recent Labs    06/23/24 1108  PROT 6.4*  ALBUMIN 3.9  AST 47*  ALT 98*  ALKPHOS 75  BILITOT 0.2   PT/INR Recent Labs    06/23/24 1108  LABPROT 13.5  INR 1.0   Hepatitis Panel Recent Labs    06/21/24 2200  HEPBSAG NON REACTIVE  HCVAB NON REACTIVE  HEPAIGM NON REACTIVE  HEPBIGM NON REACTIVE    DG Shoulder Left Port Result Date: 06/23/2024 CLINICAL DATA:  Fall, pain EXAM: LEFT SHOULDER COMPARISON:  None Available. FINDINGS: Degenerative changes in the [facility] joint with joint space narrowing and spurring. Glenohumeral joint is maintained. No acute bony abnormality. Specifically, no fracture, subluxation, or dislocation. Soft tissues  are intact. Left PICC line in place with the tip in the SVC. Diffuse airspace disease throughout the visualized left lung. IMPRESSION: Mild degenerative changes in the left AC joint. No acute bony abnormality. Diffuse airspace disease throughout the visualized left lung. Electronically Signed   By: Franky Crease M.D.   On: 06/23/2024 22:43   DG CHEST PORT 1 VIEW Result Date: 06/23/2024 CLINICAL DATA:  Chest pain EXAM: PORTABLE CHEST 1 VIEW COMPARISON:  06/21/2024 FINDINGS: Left PICC line in place with the tip in the SVC. Interval placement of right PICC line with the tip in the upper SVC at the confluence of the innominate veins. Heart mediastinal contours within normal limits. Marked worsening of diffuse left lung airspace disease and right perihilar opacities most compatible with pneumonia. No visible effusions or pneumothorax. IMPRESSION: Right PICC line placement with the tip in the upper SVC at the confluence of the innominate veins. Marked worsening in left lung airspace disease and right perihilar opacities concerning for pneumonia. Electronically Signed   By: Franky Crease M.D.   On: 06/23/2024 18:42    Assessment / Plan: 1) Nausea/Vomiting 2) Recurrent Esophageal Candidiasis 3) Esophageal stenosis From Dr. Rennis noted:  She has recurrent nausea/vomiting which is likely multifactorial, partly related to underlying chronic medical conditions, polypharmacy, but also a component of proximal esophageal stricture and recurrent candidiasis playing a role.  Last upper endoscopy did show resolution of the candidiasis but biopsies with increased esophageal lymphocytes. 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 reflux, possibly recurrent infection, immunosuppression?).   There was concern that since she vomited shortly after ingesting her pills.  Was wondering if her  Addison's is suboptimally controlled due to unreliable steroid dosing.  They discussed possible PEG tube placement and referral was put in for discussion with IR.  EGD 06/23/24:  - Benign- appearing esophageal stenosis. Dilated. - Esophageal plaques were found, suspicious for candidiasis. Biopsied. - Widely patent Schatzki ring. - 2 cm hiatal hernia. - Gastritis. Biopsied. - Multiple gastric polyps. - Normal examined duodenum. Biopsied.  A. SMALL BOWEL BIOPSY:  Duodenal mucosa with intact villous architecture.  Negative for villous atrophy and increased intraepithelial lymphocytes.   B. STOMACH BIOPSY:  Gastric antral and oxyntic mucosa with changes of reactive gastropathy  and slight chronic inflammation.  Negative for Helicobacter pylori.   C. ESOPHAGEAL BIOPSY:  Squamous mucosa with mild changes of reflux.  Small fragment of benign gastric cardiac mucosa.  Negative for lymphocytic esophagitis.     4) Addison's Disease - Will need stress dose steroids for endoscopic procedure 5) Chronic hypoxic respiratory failure on home O2 6) CHF 7) CKD 3  8) Anxiety/depression - Follows in the [facility]    9) Hypokalemia: Potassium was 2.6 on presentation, up to 3.2 today. 10) AKI likely due to dehydration with poor p.o. intake 11) Elevated LFTs: ALT up to 136, 119 today and AST up to 79, 74 today.  This is new compared to August as they were normal.  Total bili and alk phos normal.  Noncontrast CT scan shows no biliary abnormalities.  Does show stable scattered subcentimeter hypodensities within the liver statistically most likely representing cyst or hemangiomas.  Will likely plan to monitor for now. 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 - Await fungal culture sensitivities for ID purposes.   LOS: 3 days   Susan BIRCH. Zehr  06/24/2024, 9:22 AM     Attending physician's note   I have taken history, reviewed the chart and  examined the patient. I performed a substantive portion of this encounter, including complete performance of at least one of the key components, in conjunction with the APP. I agree with the Advanced Practitioner's note, impression and recommendations.   Tolerated EGD well.  Postoperatively had aspiration.  Frail pulmonary status.  She is swallowing better  Px esophageal stricture s/p dil 9/23 Suspected recurrent eso candidiasis-yesterday's Bx are neg for candidiasis/lymphocytic esophagitis. + Mild reflux. GERD with HH Aspiration pneumonia.  High risk for anesthesia Multiple comorbidities including chronic hypoxic respiratory failure on home O2 5lit/min, CHF, CKD3, Addison's disease (on IV hydrocortisone ), H/O DVTs, failure to thrive.  Plan: - Continue soft diet. - Once better from pulmonary standpoint, can consider IR G-tube - Continue Protonix . - Follow fungal C/S as outpt.  Appreciate ID input. - D/W pt. I had discussed with pt's husband yesterday.   Anselm Bring, MD [facility] [phone]

## 2024-06-24 NOTE — Progress Notes (Signed)
 On assessment, right foot wound; per patient this is a very old (weeks to months old) wound from what started as an abrasion [place] She did not want any foam/dressing on the wound on admission to ICU. Wound care consult placed for continuity of care.

## 2024-06-24 NOTE — Significant Event (Signed)
   Called at the bedside, patient with increased work of breathing, head-nodding and extremely anxious.  Patient has very poor inspiratory effort due to splinting of the lungs especially on the left side.  She also had noted some swelling of the arms and feet. On my exam Patient is alert awake and oriented.  She is in moderate distress, anxious, shaky and tremulous.  Able to answer questions with some break in sentences. Blood pressures 113/60.  Heart rate 113.  On 6 L of oxygen  when completely resting.  On increasing oxygen  when trying to talk and move. Chest x-ray done and reviewed which looks about the same with multifocal opacities more on the left side, less on the right side.  There is no evidence of pneumomediastinum or pneumothorax.  There is no evidence of pleural effusion. ABG, being ordered.   Acute on chronic hypoxemic respiratory failure, acute respiratory distress secondary to multifocal pneumonia, aspiration pneumonia. Patient with increasing oxygen  need and work of breathing related to pneumonia, poor inspiratory effort. Continue IV Zosyn .  She is also on IV micafungin . CODE STATUS discussed, discussed about next step of management that may include intubation and mechanical ventilation. Patient and husband has very clear goal of care discussions and documented, she does not want intubation and does not want any CPR.  CODE STATUS changed to DNR/DNI. Transfer to stepdown unit.  Will try heated high flow oxygen .  Patient will likely stabilize on heated high flow.  Patient is agreeable to go on BiPAP if needed, will use for respiratory distress. No intubation. If patient becomes very uncomfortable with no chances of surviving on BiPAP, both husband and wife has agreed to be treated with comfort care measures and palliative pathway.  Discussed about using doses of fentanyl  and benzodiazepines like Ativan  as injectable as needed in that case and both of them agreed.   Interdisciplinary  Goals of Care Family Meeting   Code status:   Code Status: Limited: Do not attempt resuscitation (DNR) -DNR-LIMITED -Do Not Intubate/DNI    Disposition: Continue current acute care  Time spent for the meeting: 35 minutes     Renato Applebaum, MD  06/24/2024, 5:13 PM

## 2024-06-24 NOTE — Progress Notes (Signed)
 PROGRESS NOTE    [name]  FMW:[id number] DOB: 01-24-1971 DOA: 06/21/2024 PCP: Toppin, Jean M, MD    Brief Narrative:  53 year old with history of Hodgkin's lymphoma at age 30 status post chemoradiation, breast cancer s/p chemotherapy and bilateral mastectomy in remission, cervical cancer, ovarian tumor status post TAH and left oophorectomy in 2011, right in 2016, pulmonary arterial hypertension, chronic hypoxemic failure on 3 to 5 L oxygen  at home, Addison's disease and history of migraine who was recently admitted with nausea vomiting treated for pneumonia and also found to have esophageal candidiasis presented with about 4 days of intractable nausea vomiting, unable to take any medications, abdominal discomfort. In the emergency room hemodynamically stable.  Hemoglobin 12.2.  Lipase 62.  Potassium 2.6 and magnesium  1.9.  Chest x-ray with airspace disease in the retrocardiac region on the left, atelectasis.  CT angiogram with no acute intra-abdominal process.  Bilateral nephrolithiasis without evidence of obstruction.  Complex lesion in the lower pole of the left kidney with indeterminate imaging characters.  Patient was given Solu-Cortef , lorazepam , potassium and Benadryl  and admitted to the hospital due to significant symptoms.  Patient stated not able to eat or drink for a long time as well unable to have meaningful water  intake.  Significant event 9/23, came back from upper GI endoscopy.  Patient had sudden onset left-sided chest pain and shortness of breath.  She was found to have large airspace disease and possible aspiration.  Started on antibiotics. Upper GI endoscopy with persistent candidiasis and esophageal stricture-dilated.  Subjective:  Patient seen and examined.  Anxious, tearful and multiple complaints about her pain as well as about the staff's.  Overnight events noted.  Remained painful as per the patient and was not attended very well. Husband at the bedside.  Afebrile  overnight.  On 4 L oxygen . Patient was insisting on doing surgery to take out the pain, we discussed about the chest x-ray findings and possible aspiration and antibiotics and physiotherapy for the chest. Patient and family declined to have PEG tube placement today.  She thinks she can start eating.  Will start on soft diet. Repeat chest x-ray was done and reviewed, it shows improvement of aeration on the left as compared to the last night.   Assessment & Plan:   Acute on chronic hypoxemic respiratory failure, postprocedure aspiration of gastric contents: Antibiotics to treat bacterial pneumonia, started on Zosyn . Chest physiotherapy, incentive spirometry, deep breathing exercises, sputum induction, mucolytic's and bronchodilators. Sputum cultures Supplemental oxygen  to keep saturations more than 90%.  Intractable nausea and vomiting: esophageal stricture and candidiasis.  Multiple previous treatment with antifungals.  Could not complete.  Continue maintenance IV fluids with potassium, using Benadryl  and Ativan  for nausea as she is allergic to other nausea medications.  Electrolytes are adequate. On PPI, antifungal as below. Challenge with soft diet today.  Addison's disease: Currently no evidence of exacerbation.  Patient on Solu-Cortef  at home.  Currently remains on a stress dose of steroids until clinical stabilization.  Anxiety and depression: Significant symptoms.  Patient on BuSpar, Lamictal , Klonopin .  Continue this.  She also takes Topamax  for prevention of migraine.  Hypokalemia: Keep on replacement and monitor levels.  Failure to thrive, esophageal candidiasis: ID to consult regarding candidiasis.  Started on micafungin . Discussion about G-tube placement, however on hold today.  Patient wants to try oral intake for now.    DVT prophylaxis: SCDs   Code Status: Full code Family Communication: Husband at the bedside Disposition Plan: Status is:  Inpatient Remains inpatient  appropriate because: Significant symptoms, unable to eat, IV antibiotics,     Consultants:  Gastroenterology IR Infectious disease  Procedures:  Upper GI endoscopy  Antimicrobials:  Zosyn  9/23- Micafungin  9/23-     Objective: Vitals:   06/23/24 2321 06/24/24 0437 06/24/24 0603 06/24/24 0800  BP: 113/65  (!) 99/51 113/67  Pulse: (!) 105  99 92  Resp:   18   Temp: 98.7 F (37.1 C)  98 F (36.7 C) 98.8 F (37.1 C)  TempSrc: Oral  Oral Oral  SpO2: 98% 99% 97% 98%  Weight:      Height:        Intake/Output Summary (Last 24 hours) at 06/24/2024 1143 Last data filed at 06/24/2024 0649 Gross per 24 hour  Intake 1685.75 ml  Output 500 ml  Net 1185.75 ml   Filed Weights   06/21/24 1955 06/21/24 2103  Weight: 75.2 kg 75.2 kg    Examination:  General exam: Appears uncomfortable, anxious, tearful and difficult to console. Respiratory system: Poor inspiratory effort.  No palpable tenderness.  Pleuritic left-sided chest pain.  In mild respiratory distress. SpO2: 98 % O2 Flow Rate (L/min): 5 L/min  Cardiovascular system: S1 & S2 heard, RRR. No JVD, murmurs, rubs, gallops or clicks.  Trace pedal edema. Gastrointestinal system: Abdomen is nondistended, soft and nontender. No organomegaly or masses felt. Normal bowel sounds heard. Central nervous system: Alert and oriented. No focal neurological deficits.  Grossly weak.    Data Reviewed: I have personally reviewed following labs and imaging studies  CBC: Recent Labs  Lab 06/21/24 1701 06/21/24 2200 06/22/24 0331 06/23/24 1108  WBC 7.2 5.7 5.1 7.2  NEUTROABS 3.9  --   --  5.5  HGB 12.2 12.0 10.5* 10.5*  HCT 38.5 42.2 34.0* 35.0*  MCV 76.5* 82.4 78.7* 81.0  PLT 271 222 209 241   Basic Metabolic Panel: Recent Labs  Lab 06/21/24 1701 06/21/24 2200 06/22/24 0331 06/23/24 1108  NA 139 137 140 143  K 2.6* 3.6 3.2* 4.2  CL 98 103 105 112*  CO2 23 17* 21* 20*  GLUCOSE 129* 110* 164* 121*  BUN 27* 26* 20 15   CREATININE 1.93* 1.56* 1.43* 1.05*  CALCIUM  9.6 8.3* 8.1* 8.3*  MG 1.9 2.2  --  2.3  PHOS  --  2.8  --  1.7*   GFR: Estimated Creatinine Clearance: 61.5 mL/min (A) (by C-G formula based on SCr of 1.05 mg/dL (H)). Liver Function Tests: Recent Labs  Lab 06/21/24 1701 06/22/24 0331 06/23/24 1108  AST 79* 74* 47*  ALT 136* 119* 98*  ALKPHOS 99 79 75  BILITOT 0.4 <0.2 0.2  PROT 8.5* 6.9 6.4*  ALBUMIN 4.8 4.0 3.9   Recent Labs  Lab 06/21/24 1701  LIPASE 62*   No results for input(s): AMMONIA in the last 168 hours. Coagulation Profile: Recent Labs  Lab 06/23/24 1108  INR 1.0   Cardiac Enzymes: No results for input(s): CKTOTAL, CKMB, CKMBINDEX, TROPONINI in the last 168 hours. BNP (last 3 results) No results for input(s): PROBNP in the last 8760 hours. HbA1C: No results for input(s): HGBA1C in the last 72 hours. CBG: Recent Labs  Lab 06/24/24 0747  GLUCAP 116*   Lipid Profile: No results for input(s): CHOL, HDL, LDLCALC, TRIG, CHOLHDL, LDLDIRECT in the last 72 hours. Thyroid  Function Tests: No results for input(s): TSH, T4TOTAL, FREET4, T3FREE, THYROIDAB in the last 72 hours. Anemia Panel: No results for input(s): VITAMINB12, FOLATE, FERRITIN, TIBC, IRON, RETICCTPCT  in the last 72 hours. Sepsis Labs: Recent Labs  Lab 06/21/24 2200 06/22/24 0330  PROCALCITON <0.10  --   LATICACIDVEN  --  0.8    No results found for this or any previous visit (from the past 240 hours).       Radiology Studies: DG Shoulder Left Port Result Date: 06/23/2024 CLINICAL DATA:  Fall, pain EXAM: LEFT SHOULDER COMPARISON:  None Available. FINDINGS: Degenerative changes in the [facility] joint with joint space narrowing and spurring. Glenohumeral joint is maintained. No acute bony abnormality. Specifically, no fracture, subluxation, or dislocation. Soft tissues are intact. Left PICC line in place with the tip in the SVC. Diffuse airspace disease  throughout the visualized left lung. IMPRESSION: Mild degenerative changes in the left AC joint. No acute bony abnormality. Diffuse airspace disease throughout the visualized left lung. Electronically Signed   By: Franky Crease M.D.   On: 06/23/2024 22:43   DG CHEST PORT 1 VIEW Result Date: 06/23/2024 CLINICAL DATA:  Chest pain EXAM: PORTABLE CHEST 1 VIEW COMPARISON:  06/21/2024 FINDINGS: Left PICC line in place with the tip in the SVC. Interval placement of right PICC line with the tip in the upper SVC at the confluence of the innominate veins. Heart mediastinal contours within normal limits. Marked worsening of diffuse left lung airspace disease and right perihilar opacities most compatible with pneumonia. No visible effusions or pneumothorax. IMPRESSION: Right PICC line placement with the tip in the upper SVC at the confluence of the innominate veins. Marked worsening in left lung airspace disease and right perihilar opacities concerning for pneumonia. Electronically Signed   By: Franky Crease M.D.   On: 06/23/2024 18:42        Scheduled Meds:  albuterol   2.5 mg Nebulization Q6H   [START ON 06/25/2024] aspirin  EC  81 mg Oral Daily   budesonide  (PULMICORT ) nebulizer solution  0.25 mg Nebulization BID   buPROPion   150 mg Oral Daily   Chlorhexidine  Gluconate Cloth  6 each Topical Daily   feeding supplement  1 Container Oral TID BM   FLUoxetine   40 mg Oral Daily   hydrocortisone  sod succinate (SOLU-CORTEF ) inj  100 mg Intravenous Q8H   lamoTRIgine   200 mg Oral QHS   lamoTRIgine   50 mg Oral Daily   levothyroxine   100 mcg Oral q morning   OLANZapine   5 mg Oral QHS   pantoprazole  (PROTONIX ) IV  40 mg Intravenous Q12H   rosuvastatin   10 mg Oral Daily   sodium chloride  flush  10-40 mL Intracatheter Q12H   topiramate   150 mg Oral QHS   umeclidinium bromide   1 puff Inhalation Daily   Continuous Infusions:   ceFAZolin  (ANCEF ) IV     dextrose  5 % and 0.9 % NaCl with KCl 40 mEq/L 100 mL/hr at  06/24/24 1009   micafungin  (MYCAMINE ) 150 mg in sodium chloride  0.9 % 100 mL IVPB 150 mg (06/23/24 1806)   piperacillin -tazobactam (ZOSYN )  IV 3.375 g (06/24/24 0521)   potassium PHOSPHATE  IVPB (in mmol) 20 mmol (06/24/24 1008)     LOS: 3 days    Time spent: 60 minutes   Renato Applebaum, MD [facility]

## 2024-06-24 NOTE — TOC Initial Note (Signed)
 Transition of Care [facility]) - Initial/Assessment Note    Patient Details  Name: [name] MRN: [id number] Date of Birth: 08-Sep-1971  [facility] CM/SW Contact:    Toy LITTIE Agar, RN Phone Number:[phone]  06/24/2024, 3:47 PM  Clinical Narrative:                 IP care manager following patient with high risk for readmission. Patient is from home where she lives with her husband and son & states that she functions independently with DME  and O2. PCP Dr. Toppin. Patient confirms that she has access to affordable medications. Home DME includes wheelchair, walker, chest vest & Home O2 per [employer]. [facility] [address] and [place] [facility]. Currently there are no IP care management needs. IPCM will continue to follow for potential disposition needs.   Expected Discharge Plan: Home/Self Care Barriers to Discharge: Continued Medical Work up   Patient Goals and CMS Choice Patient states their goals for this hospitalization and ongoing recovery are:: Wants to breath easier and feel better. CMS Medicare.gov Compare Post Acute Care list provided to::  (n/a) Choice offered to / list presented to : NA [place] ownership interest in [facility].provided to::  (n/a)    Expected Discharge Plan and Services In-house Referral: NA Discharge Planning Services: CM Consult Post Acute Care Choice: NA Living arrangements for the past 2 months: Single Family Home                 DME Arranged: N/A DME Agency: NA (previously used [employer] currently active)       HH Arranged: NA HH Agency: NA        Prior Living Arrangements/Services Living arrangements for the past 2 months: Single Family Home Lives with:: Adult Children, Spouse Patient language and need for interpreter reviewed:: Yes Do you feel safe going back to the place where you live?: Yes      Need for Family Participation in Patient Care: Yes (Comment) Care  giver support system in place?: Yes (comment) Current home services: DME (Home O2 with [employer], rolling walker, wheelchair, chest vest Active with [facility] for palliattive care.) Criminal Activity/Legal Involvement Pertinent to Current Situation/Hospitalization: No - Comment as needed  Activities of Daily Living   ADL Screening (condition at time of admission) Independently performs ADLs?: Yes (appropriate for developmental age) Is the patient deaf or have difficulty hearing?: No Does the patient have difficulty seeing, even when wearing glasses/contacts?: No Does the patient have difficulty concentrating, remembering, or making decisions?: Yes  Permission Sought/Granted Permission sought to share information with : Family Supports Permission granted to share information with : Yes, Verbal Permission Granted  Share Information with NAME: Onyx Edgley [phone]     Permission granted to share info w Relationship: husband  Permission granted to share info w Contact Information: [phone]  Emotional Assessment Appearance:: Appears stated age Attitude/Demeanor/Rapport: Crying Affect (typically observed): Tearful/Crying, Sad Orientation: : Oriented to Self, Oriented to Place, Oriented to  Time, Oriented to Situation Alcohol / Substance Use: Not Applicable Psych Involvement: No (comment)  Admission diagnosis:  Hypokalemia [E87.6] Adrenal insufficiency [E27.40] AKI (acute kidney injury) [N17.9] Acute kidney injury [N17.9] Nausea and vomiting, unspecified vomiting type [R11.2] Patient Active Problem List   Diagnosis Date Noted   Hypophosphatemia 05/19/2024   Acute on chronic respiratory failure with hypoxia (HCC) 05/18/2024   Pulmonary arterial hypertension (HCC) 05/18/2024   Pneumonia 05/18/2024   Hiatal  hernia 04/13/2024   Hematemesis with nausea 04/13/2024   Nausea and vomiting 04/13/2024   Fundic gland polyps of stomach, benign 04/13/2024   Benign esophageal stricture  02/11/2024   Gastroesophageal reflux disease with esophagitis 01/07/2024   Esophageal candidiasis (HCC) 01/07/2024   Esophageal dysphagia 12/26/2023   Odynophagia 12/26/2023   Gastric polyps 12/26/2023   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   AKI (acute kidney injury) 04/23/2023   Adrenal crisis 04/22/2023   Severe persistent asthma 03/09/2023   CKD stage 3a, GFR 45-59 ml/min (HCC) 03/09/2023   Goals of care, counseling/discussion 01/22/2023   Anemia 01/22/2023   Cellulitis 09/13/2022   Grade I diastolic dysfunction 09/13/2022   Excessive daytime sleepiness 08/01/2022   Anxiety    Anxiety and depression 04/06/2022   GERD without esophagitis 04/06/2022   CAP (community acquired pneumonia) 04/06/2022   PICC (peripherally inserted central catheter) in place    Bacteremia due to Enterobacter species 03/29/2022   Normocytic anemia 03/29/2022   Major depressive disorder, recurrent episode, moderate (HCC)    Generalized anxiety disorder/depression    Seizure-like activity (HCC) 12/08/2020   High anion gap metabolic acidosis 12/08/2020   Hypokalemia 12/08/2020   Acute renal failure superimposed on stage 3a chronic kidney disease (HCC) 04/27/2020   Vasculopathy 04/27/2020   Bacteremia 04/19/2020   Addison's disease (HCC)    Chronic nausea 04/01/2020   Generalized weakness 02/10/2020   Headache 02/10/2020   Subcutaneous nodule 02/01/2020   Port-A-Cath in place 09/09/2019   Superficial thrombophlebitis 07/16/2019   Injury of left index finger 12/24/2018   History of pituitary adenoma 12/23/2018   History of cervical cancer 12/23/2018   SOB (shortness of breath)    Nephrolithiasis 11/07/2018   Migraine 11/07/2018   PVC's (premature ventricular contractions) 10/27/2018   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   Hyperlipidemia 03/09/2014   Hx of Hodgkins lymphoma    History of ductal carcinoma in situ (DCIS) of breast    History of thyroid  cancer s/p thyroidectomy and post surgical  hypothyroidism    Raynaud phenomenon    PCP:  Naaman Cy HERO, MD Pharmacy:   [id number] [place], [place] - [address] AT [facility] [address] [place] [address] Phone: [phone] Fax: [phone]  [id number] [place],  - [address] AT [facility] [address] & [facility] [address] [place] [address] Phone: [phone] Fax: [phone]     Social Drivers of Health (SDOH) Social History: SDOH Screenings   Food Insecurity: No Food Insecurity (06/21/2024)  Housing: Low Risk  (06/21/2024)  Transportation Needs: No Transportation Needs (06/21/2024)  Utilities: Not At Risk (06/21/2024)  Depression (PHQ2-9): Low Risk  (06/14/2022)  Financial Resource Strain: Medium Risk (08/21/2017)   Received from [facility]  Physical Activity: Unknown (08/21/2017)   Received from [facility]  Social Connections: Unknown (02/12/2022)   Received from [facility]  Stress: Stress Concern Present (08/21/2017)   Received from [facility]  Tobacco Use: Low Risk  (06/21/2024)   SDOH Interventions:     Readmission Risk Interventions    06/24/2024    3:08 PM 05/19/2024    1:45 PM 03/12/2023    9:36 PM  Readmission Risk Prevention Plan  Transportation Screening Complete Complete Complete  Medication Review (RN Care Manager) Referral to Pharmacy Complete Complete  PCP or Specialist appointment within 3-5 days of discharge Complete Complete Complete  HRI or Home Care Consult Complete Complete Complete  SW Recovery Care/Counseling Consult Complete Complete Complete  Palliative Care Screening Not Applicable Not Applicable Not Applicable  Skilled Nursing Facility Not Applicable Not Applicable Not Applicable

## 2024-06-24 NOTE — Progress Notes (Signed)
 This RN received report at 1620. Upon handoff rounds, pt was noted by RN to be SOB on 5L [place] and audibly rhonchus. Charge RN notified by RN as well as Dr Raenelle who was requested at bedside. Rapid response also called, and placed pt on 15L HFNC. Dr Raenelle ordered STAT CXR, ABG and 40 mg IV lasix . SD orders placed, and report was given to Gilda, RN at bedside. VS stable at this time on 15L HFNC. All patient belongings and paper chart transported with patient.

## 2024-06-25 ENCOUNTER — Inpatient Hospital Stay (HOSPITAL_COMMUNITY)

## 2024-06-25 DIAGNOSIS — Z515 Encounter for palliative care: Secondary | ICD-10-CM

## 2024-06-25 DIAGNOSIS — B3781 Candidal esophagitis: Secondary | ICD-10-CM | POA: Diagnosis not present

## 2024-06-25 DIAGNOSIS — Z79899 Other long term (current) drug therapy: Secondary | ICD-10-CM

## 2024-06-25 DIAGNOSIS — J690 Pneumonitis due to inhalation of food and vomit: Secondary | ICD-10-CM

## 2024-06-25 DIAGNOSIS — J9601 Acute respiratory failure with hypoxia: Secondary | ICD-10-CM

## 2024-06-25 DIAGNOSIS — N179 Acute kidney failure, unspecified: Secondary | ICD-10-CM | POA: Diagnosis not present

## 2024-06-25 DIAGNOSIS — R4589 Other symptoms and signs involving emotional state: Secondary | ICD-10-CM

## 2024-06-25 DIAGNOSIS — E2740 Unspecified adrenocortical insufficiency: Secondary | ICD-10-CM

## 2024-06-25 DIAGNOSIS — Z7189 Other specified counseling: Secondary | ICD-10-CM

## 2024-06-25 DIAGNOSIS — R0602 Shortness of breath: Secondary | ICD-10-CM

## 2024-06-25 DIAGNOSIS — R1319 Other dysphagia: Secondary | ICD-10-CM

## 2024-06-25 DIAGNOSIS — R52 Pain, unspecified: Secondary | ICD-10-CM

## 2024-06-25 LAB — COMPREHENSIVE METABOLIC PANEL WITH GFR
ALT: 56 U/L — ABNORMAL HIGH (ref 0–44)
AST: 33 U/L (ref 15–41)
Albumin: 3.4 g/dL — ABNORMAL LOW (ref 3.5–5.0)
Alkaline Phosphatase: 61 U/L (ref 38–126)
Anion gap: 13 (ref 5–15)
BUN: 16 mg/dL (ref 6–20)
CO2: 21 mmol/L — ABNORMAL LOW (ref 22–32)
Calcium: 8.2 mg/dL — ABNORMAL LOW (ref 8.9–10.3)
Chloride: 110 mmol/L (ref 98–111)
Creatinine, Ser: 1.06 mg/dL — ABNORMAL HIGH (ref 0.44–1.00)
GFR, Estimated: >60 mL/min (ref >60)
Glucose, Bld: 133 mg/dL — ABNORMAL HIGH (ref 70–99)
Potassium: 3.4 mmol/L — ABNORMAL LOW (ref 3.5–5.1)
Sodium: 143 mmol/L (ref 135–145)
Total Bilirubin: 0.5 mg/dL (ref 0.0–1.2)
Total Protein: 6.2 g/dL — ABNORMAL LOW (ref 6.5–8.1)

## 2024-06-25 LAB — CBC WITH DIFFERENTIAL/PLATELET
Abs Immature Granulocytes: 0.13 K/uL — ABNORMAL HIGH (ref 0.00–0.07)
Basophils Absolute: 0 K/uL (ref 0.0–0.1)
Basophils Relative: 0 %
Eosinophils Absolute: 0 K/uL (ref 0.0–0.5)
Eosinophils Relative: 0 %
HCT: 31.9 % — ABNORMAL LOW (ref 36.0–46.0)
Hemoglobin: 9.8 g/dL — ABNORMAL LOW (ref 12.0–15.0)
Immature Granulocytes: 1 %
Lymphocytes Relative: 7 %
Lymphs Abs: 1 K/uL (ref 0.7–4.0)
MCH: 24.6 pg — ABNORMAL LOW (ref 26.0–34.0)
MCHC: 30.7 g/dL (ref 30.0–36.0)
MCV: 80.2 fL (ref 80.0–100.0)
Monocytes Absolute: 0.6 K/uL (ref 0.1–1.0)
Monocytes Relative: 4 %
Neutro Abs: 11.6 K/uL — ABNORMAL HIGH (ref 1.7–7.7)
Neutrophils Relative %: 88 %
Platelets: 206 K/uL (ref 150–400)
RBC: 3.98 MIL/uL (ref 3.87–5.11)
RDW: 25.6 % — ABNORMAL HIGH (ref 11.5–15.5)
Smear Review: NORMAL
WBC: 13.3 K/uL — ABNORMAL HIGH (ref 4.0–10.5)
nRBC: 0 % (ref 0.0–0.2)

## 2024-06-25 LAB — MAGNESIUM: Magnesium: 1.9 mg/dL (ref 1.7–2.4)

## 2024-06-25 LAB — PHOSPHORUS: Phosphorus: 2.5 mg/dL (ref 2.5–4.6)

## 2024-06-25 MED ORDER — POTASSIUM CHLORIDE CRYS ER 20 MEQ PO TBCR
20.0000 meq | EXTENDED_RELEASE_TABLET | Freq: Two times a day (BID) | ORAL | Status: DC
  Filled 2024-06-25: qty 1

## 2024-06-25 MED ORDER — FUROSEMIDE 10 MG/ML IJ SOLN
40.0000 mg | Freq: Once | INTRAMUSCULAR | Status: AC
Start: 2024-06-25 — End: 2024-06-25
  Administered 2024-06-25: 40 mg via INTRAVENOUS
  Filled 2024-06-25: qty 4

## 2024-06-25 MED ORDER — FENTANYL CITRATE PF 50 MCG/ML IJ SOSY
12.5000 ug | PREFILLED_SYRINGE | INTRAMUSCULAR | Status: DC | PRN
  Administered 2024-06-25 (×5): 25 ug via INTRAVENOUS
  Filled 2024-06-25 (×6): qty 1

## 2024-06-25 MED ORDER — HYDROMORPHONE HCL 1 MG/ML IJ SOLN
0.5000 mg | INTRAMUSCULAR | Status: DC | PRN
  Administered 2024-06-25 – 2024-06-27 (×18): 1 mg via INTRAVENOUS
  Filled 2024-06-25 (×19): qty 1

## 2024-06-25 MED ORDER — POTASSIUM CHLORIDE 10 MEQ/100ML IV SOLN
10.0000 meq | INTRAVENOUS | Status: AC
  Administered 2024-06-25 (×4): 10 meq via INTRAVENOUS
  Filled 2024-06-25 (×4): qty 100

## 2024-06-25 NOTE — Progress Notes (Signed)
 I was called to pt bedside because her sats were steady between 85-88% with 15LHFNC and 15L Nonbreather mask being held in front of her face. Pt was very anxious.  I gave the pt a scheduled breathing treatment, which increased her sats to 90%.  However, when the breathing treatment was done, she began to desat again.  I placed pt on HHFNC 45L/85%, pt sats increased to 95% and staying stable. Will continue to monitor pt and treat.

## 2024-06-25 NOTE — Consult Note (Signed)
 Consultation Note Date: 06/25/2024   Patient Name: SHAKHIA GRAMAJO  DOB: 09/20/1971  MRN: [id number]  Age / Sex: 53 y.o., female   PCP: Toppin, [name], MD Referring Physician: Raenelle Coria, MD  Reason for Consultation: Establishing goals of care     Chief Complaint/History of Present Illness:   Patient is a 53 year old female with a past medical history of Hodgkin's lymphoma at age 29 status post chemoradiation, breast cancer status postchemotherapy and bilateral mastectomy in remission, cervical cancer in remission, and ovarian tumor status post TAH and left oophorectomy in 2011 and right oophorectomy in 2016, pulmonary arterial hypertension, Addison's disease, migraines, and chronic hypoxic respiratory failure on 3-5 L oxygen  at home who was admitted on 06/21/2024 for management of intractable nausea and vomiting associated with abdominal discomfort.  During hospitalization, patient underwent upper GI endoscopy on 06/23/2024 for further workup.  After this patient had sudden onset of left-sided chest pain and shortness of breath and was found to have large airspace disease in postprocedural aspiration leading to multifocal pneumonia.  EGD did show persistent candidiasis and esophageal stricture present which was dilated.  Palliative medicine team consulted to assist with complex medical decision making.  Extensive review of EMR including recent documentation from hospitalist, infectious disease, and GI.  Patient receiving management in stepdown unit for multifocal pneumonia requiring increasing oxygen  support.  Plan had been for patient to have G-tube placement due to failure to thrive though that is currently on hold with worsening medical status.  At time of EMR review, patient on HHFNC 45 L/min at 85%. Also at time of EMR review in past 24 hours patient has received as needed IV fentanyl  25 mcg x 4 doses and 12.5 mcg x 6 doses.  Patient also has received as needed oxycodone  5 mg x 2 doses and as  needed Ativan  0.5 mg x 3 doses. Review of CMP noted GFR still estimated to be over 60.  CBC noted WBC elevated at 13.3. Discussed care with bedside RN for medical updates.  ------------------------------------------------------------------------------------------------------------- Advance Care Planning Conversation  Pertinent diagnosis: Multilobar aspiration pneumonia, Addison's disease, acute on chronic hypoxic respiratory failure, esophageal dysphagia, esophageal stricture  The patient and family consented to a voluntary Advance Care Planning Conversation in person. Individuals present for the conversation: Patient, patient's husband, this palliative medicine provider  Summary of the conversation:  Presented to bedside to see patient.  Patient laying in bed on HHFNC.  Patient laying in bed and appears very ill and lethargic.  Patient's husband present at bedside.  Able to introduce myself as a member of the palliative medicine team my role in patient's medical journey.  Patient did attempt to awaken to engage in conversation though quickly fell back asleep after noting having pain that was not controlled.  Spent time discussing medical care with husband for updates.  Has been noted that he had talked to hospitalist this morning and is aware of patient's acute deterioration.  Discussed all patient has gone through in her life.  Husband noted that because of her multiple medical illnesses, they have had multiple discussions about her wishes regarding medical care.  While husband appreciates all medical care patient is getting, he notes hearing that realistically patient's chances of improvement are minimal.  Discussed signs of patient's worsening medical status including requiring further oxygen  support.  Also expressed concern about patient's deterioration at this time.  Noted that while patient is continuing to receive aggressive medical management to determine if things will turn around, also  want to  manage her symptoms to make sure she is not suffering.  Husband acknowledged this and noted that patient's comfort would be very important.  He noted hearing that if patient is not improving, would need to transition over to comfort focused care at the end of life.  Spent time discussing what this would and would not entail.  With patient's current oxygen  requirements, if was to transition over to comfort focused care, would anticipate hospital death.  Husband acknowledging this.  Discussed at this time we will adjust opioids to help with her work of breathing while allowing time to see if patient is going to improve.  Also discussed that if patient continues to deteriorate, would continue to have conversations and need to transition to comfort focused care at that time.  Husband acknowledging this.  Spent time learning about patient's family and support.  Husband noted that they each have a daughter from a previous marriage.  Patient is estranged from her daughter for the past 3 years though he has informed the daughter of patient's serious medical illness at this time.  His daughter is also aware of patient's worsening condition.  Husband noted that they share an 49 year old autistic son who he has been trying to support through this.  He noted that patient previously had support through [facility] and son has continued to see a Veterinary surgeon through [employer].  Noted would reach out to [facility] to determine if therapist can help support son during this difficult time with patient's rapid deterioration.  Husband voiced appreciation for this.  Spent time providing emotional support via active listening.  All questions answered at that time.  Husband did ask that if patient is deteriorating, please let him know so that he can get their son to bring to bedside to say goodbye.  Have informed RN of this request as well.  Noted palliative medicine team to continue to follow with patient's  medical journey.  Outcome of the conversations and/or documents completed:  Currently continuing appropriate medical interventions.  Husband acknowledging patient's continued medical deterioration.  Should patient continue to worsen, husband to be informed to consider transition to comfort focused care at that time.  DNR/DNI status was confirmed.  I spent 30 minutes providing separately identifiable ACP services with the patient and/or surrogate decision maker in a voluntary, in-person conversation discussing the patient's wishes and goals as detailed in the above note.  Tinnie Radar, DO Palliative Medicine Provider  -------------------------------------------------------------------------------------------------------------  Discussed care with hospitalist, gastroenterologist, chaplain, [facility] liaison, and RN to coordinate care after visit.  Primary Diagnoses  Present on Admission:  AKI (acute kidney injury)   Past Medical History:  Diagnosis Date   Addison's disease (HCC)    Adrenal insufficiency    Anemia    Anxiety    Aortic stenosis    Aortic stenosis    Appendicitis 12/19/2009   Appendicitis    Breast cancer (HCC)    STATUS POST BILATERAL MASTECTOMY. STATUS POST RECONSTRUCTION. SHE HAD SILICONE BREAST IMPLANTS AND THE LEFT IMPLANT IS LEAKING SLIGHTLY   Cellulitis of right middle finger 11/07/2018   Cervical cancer (HCC) 12/23/2018   Chest pain    CHF with right heart failure (HCC) 04/17/2017   Chronic respiratory failure with hypoxia (HCC) 12/23/2018   CKD (chronic kidney disease) stage 3, GFR 30-59 ml/min (HCC)    Cough variant asthma 04/13/2019   Depression    Functional neurological symptom disorder with mixed symptoms 2023   GERD (gastroesophageal reflux disease)  takes Dexilant  and carafate  and gi coctail    Headache    migraines on a daily and monthly regimen    Heart murmur    History of kidney stones    Hodgkin lymphoma (HCC)    STATUS POST MANTLE RADIATION    Hodgkin's lymphoma (HCC)    1987   Hypertension    Hypoxia    Multiple lung nodules    bilateral   Necrotizing fasciitis (HCC) 12/23/2018   Non-ischemic cardiomyopathy (HCC)    Osteoporosis    Ovarian tumor (benign)    bilateral   Palpitations    Pituitary adenoma (HCC) 12/23/2018   Pneumonia    PONV (postoperative nausea and vomiting)    Pre-diabetes    per pt; no meds   Pulmonary hypertension (HCC) 12/23/2018   Raynaud phenomenon    Right heart failure (HCC) 04/17/2017   Seizures (HCC)    last febrile seizure was approx 3 weeks ago per report on 12/01/2020   Supplemental oxygen  dependent    3 liters   SVT (supraventricular tachycardia)    Tachycardia    Thyroid  cancer (HCC)    STATUS POST SURGICAL REMOVAL-CURRENT ON THYROID  REPLACEMENT   Social History   Socioeconomic History   Marital status: Married    Spouse name: Not on file   Number of children: Not on file   Years of education: Not on file   Highest education level: Not on file  Occupational History   Not on file  Tobacco Use   Smoking status: Never   Smokeless tobacco: Never  Vaping Use   Vaping status: Never Used  Substance and Sexual Activity   Alcohol use: Not Currently    Comment: social    Drug use: No   Sexual activity: Not Currently    Birth control/protection: Surgical  Other Topics Concern   Not on file  Social History Narrative   Not on file   Social Drivers of Health   Financial Resource Strain: Medium Risk (08/21/2017)   Received from [facility]   Overall Financial Resource Strain (CARDIA)    Difficulty of Paying Living Expenses: Somewhat hard  Food Insecurity: No Food Insecurity (06/21/2024)   Hunger Vital Sign    Worried About Running Out of Food in the Last Year: Never true    Ran Out of Food in the Last Year: Never true  Transportation Needs: No Transportation Needs (06/21/2024)   PRAPARE - Administrator, Civil Service (Medical): No    Lack of  Transportation (Non-Medical): No  Physical Activity: Unknown (08/21/2017)   Received from [facility]   Exercise Vital Sign    Days of Exercise per Week: 0 days    Minutes of Exercise per Session: Not on file  Stress: Stress Concern Present (08/21/2017)   Received from [facility] - Occupational Stress Questionnaire    Feeling of Stress : Very much  Social Connections: Unknown (02/12/2022)   Received from [facility]   Social Network    Social Network: Not on file   Family History  Adopted: Yes  Family history unknown: Yes   Scheduled Meds:  albuterol   2.5 mg Nebulization Q6H   aspirin  EC  81 mg Oral Daily   budesonide  (PULMICORT ) nebulizer solution  0.25 mg Nebulization BID   buPROPion   150 mg Oral Daily   Chlorhexidine  Gluconate Cloth  6 each Topical Daily   feeding supplement  1  Container Oral TID BM   FLUoxetine   40 mg Oral Daily   hydrocortisone  sod succinate (SOLU-CORTEF ) inj  100 mg Intravenous Q8H   lamoTRIgine   200 mg Oral QHS   lamoTRIgine   50 mg Oral Daily   levothyroxine   100 mcg Oral q morning   OLANZapine   5 mg Oral QHS   pantoprazole  (PROTONIX ) IV  40 mg Intravenous Q12H   potassium chloride   20 mEq Oral BID   rosuvastatin   10 mg Oral Daily   sodium chloride  flush  10-40 mL Intracatheter Q12H   topiramate   150 mg Oral QHS   umeclidinium bromide   1 puff Inhalation Daily   Continuous Infusions:  micafungin  (MYCAMINE ) 150 mg in sodium chloride  0.9 % 100 mL IVPB Stopped (06/24/24 1448)   piperacillin -tazobactam (ZOSYN )  IV Stopped (06/25/24 1005)   PRN Meds:.acetaminophen  **OR** acetaminophen , albuterol , bisacodyl , cyclobenzaprine , diphenhydrAMINE , fentaNYL  (SUBLIMAZE ) injection, hydrALAZINE , LORazepam , mouth rinse, oxyCODONE , sodium chloride  flush, zolpidem  Allergies  Allergen Reactions   Ferrous Bisglycinate Chelate [Iron] Anaphylaxis and Other (See Comments)    Only IV - only  FERRLICET   Mushroom Extract Complex (Obsolete) Anaphylaxis   Na Ferric Gluc Cplx In Sucrose Anaphylaxis   Tape Hives    Other Reaction(s): other   Cymbalta [Duloxetine Hcl] Swelling and Anxiety   Hydromorphone  Other (See Comments)    BP drop and heart rate drops 5.1.20 PT REPORTS THAT SHE TAKES DILAUDID  AT HOME   Morphine Other (See Comments)    I clawed out my IV(s)   Ondansetron  Hcl Itching and Other (See Comments)    Clawed off my skin   Promethazine Other (See Comments)    Made me to clinch up and my arms shook   Succinylcholine Other (See Comments)    Lock Jaw   Buprenorphine Hcl Hives   Compazine Other (See Comments)    Altered mental status Aggression   Duloxetine    Metoclopramide Other (See Comments)    Dystonia   Morphine And Codeine Hives   Ondansetron  Hives and Rash    Other reaction(s): Unknown Other reaction(s): Unknown   Promethazine Hcl Hives   Silver  Other (See Comments) and Rash    Old formulation only, is able to tolerate new formulation  Other Reaction(s): other   CBC:    Component Value Date/Time   WBC 13.3 (H) 06/25/2024 0559   HGB 9.8 (L) 06/25/2024 0559   HGB 10.6 (L) 08/20/2022 1513   HCT 31.9 (L) 06/25/2024 0559   HCT 34.4 08/20/2022 1513   PLT 206 06/25/2024 0559   PLT 414 08/20/2022 1513   MCV 80.2 06/25/2024 0559   MCV 78 (L) 08/20/2022 1513   NEUTROABS 11.6 (H) 06/25/2024 0559   LYMPHSABS 1.0 06/25/2024 0559   MONOABS 0.6 06/25/2024 0559   EOSABS 0.0 06/25/2024 0559   BASOSABS 0.0 06/25/2024 0559   Comprehensive Metabolic Panel:    Component Value Date/Time   NA 143 06/25/2024 0559   NA 140 08/20/2022 1513   K 3.4 (L) 06/25/2024 0559   CL 110 06/25/2024 0559   CO2 21 (L) 06/25/2024 0559   BUN 16 06/25/2024 0559   BUN 28 (H) 08/20/2022 1513   CREATININE 1.06 (H) 06/25/2024 0559   CREATININE 1.08 (H) 02/09/2022 1202   GLUCOSE 133 (H) 06/25/2024 0559   CALCIUM  8.2 (L) 06/25/2024 0559   AST 33 06/25/2024 0559   AST 26  02/09/2022 1202   ALT 56 (H) 06/25/2024 0559   ALT 40 02/09/2022 1202   ALKPHOS 61 06/25/2024 0559  BILITOT 0.5 06/25/2024 0559   BILITOT 0.4 02/09/2022 1202   PROT 6.2 (L) 06/25/2024 0559   PROT 7.1 01/23/2018 1225   ALBUMIN 3.4 (L) 06/25/2024 0559   ALBUMIN 4.4 01/23/2018 1225    Physical Exam: Vital Signs: BP 126/68   Pulse (!) 104   Temp 98.3 F (36.8 C) (Oral)   Resp (!) 37   Ht 5' 5 (1.651 m)   Wt 80.7 kg   LMP  (LMP Unknown)   SpO2 98%   BMI 29.61 kg/m  SpO2: SpO2: 98 % O2 Device: O2 Device: Heated High Flow Nasal Cannula O2 Flow Rate: O2 Flow Rate (L/min): 45 L/min Intake/output summary:  Intake/Output Summary (Last 24 hours) at 06/25/2024 0945 Last data filed at 06/24/2024 2320 Gross per 24 hour  Intake 1523.97 ml  Output 2250 ml  Net -726.03 ml   LBM: Last BM Date : 06/25/24 Baseline Weight: Weight: 75.2 kg Most recent weight: Weight: 80.7 kg  General: Lethargic, ill-appearing, frail Cardiovascular: Tachycardia noted Respiratory: Greatly increased work of breathing noted, on HHFNC 45 L/min at FiO2 85% Neuro: Lethargic          Palliative Performance Scale: 10%              Additional Data Reviewed: Recent Labs    06/23/24 1108 06/25/24 0559  WBC 7.2 13.3*  HGB 10.5* 9.8*  PLT 241 206  NA 143 143  BUN 15 16  CREATININE 1.05* 1.06*    Imaging: DG CHEST PORT 1 VIEW CLINICAL DATA:  Respiratory compromise  EXAM: PORTABLE CHEST 1 VIEW  COMPARISON:  06/24/2024  FINDINGS: Left PICC line remains in place, unchanged. Severe diffuse left lung airspace disease and patchy right lung airspace disease, worsening in the right upper lobe since prior study. No effusions or pneumothorax. No acute bony abnormality.  IMPRESSION: Severe bilateral airspace disease, left greater than right, worsening in the right upper lobe since prior study.  Electronically Signed   By: Franky Crease M.D.   On: 06/24/2024 17:20 DG CHEST PORT 1 VIEW CLINICAL DATA:   Pneumonia  EXAM: PORTABLE CHEST 1 VIEW  COMPARISON:  June 23, 2024 and prior studies  FINDINGS: Unchanged left-sided PICC line with tip in the mid SVC.  Multifocal bilateral airspace opacities throughout the left lung and in the right perihilar region and right upper lung with improved aeration of the left lung. No definite new pneumothorax. No obvious pleural effusions. Cardiomediastinal silhouette is partially obscured, limiting evaluation. No acute osseous findings.  IMPRESSION: Extensive multifocal airspace opacities bilaterally, left greater than right. Interval decrease in left lung opacities and slightly increased opacities in the right upper lung.  Electronically Signed   By: Michaeline Blanch M.D.   On: 06/24/2024 14:33 DG Abd Portable 1V CLINICAL DATA:  Vomiting  EXAM: PORTABLE ABDOMEN - 1 VIEW  COMPARISON:  June 21, 2024 CT  FINDINGS: Nonobstructive bowel gas pattern.  No acute osseous findings.  IMPRESSION: Nonobstructive bowel gas pattern.  Electronically Signed   By: Michaeline Blanch M.D.   On: 06/24/2024 14:29    I personally reviewed recent imaging.   Palliative Care Assessment and Plan Summary of Established Goals of Care and Medical Treatment Preferences   Patient is a 53 year old female with a past medical history of Hodgkin's lymphoma at age 12 status post chemoradiation, breast cancer status postchemotherapy and bilateral mastectomy in remission, cervical cancer in remission, and ovarian tumor status post TAH and left oophorectomy in 2011 and right oophorectomy in 2016, pulmonary  arterial hypertension, Addison's disease, migraines, and chronic hypoxic respiratory failure on 3-5 L oxygen  at home who was admitted on 06/21/2024 for management of intractable nausea and vomiting associated with abdominal discomfort.  During hospitalization, patient underwent upper GI endoscopy on 06/23/2024 for further workup.  After this patient had sudden onset of  left-sided chest pain and shortness of breath and was found to have large airspace disease in postprocedural aspiration leading to multifocal pneumonia.  EGD did show persistent candidiasis and esophageal stricture present which was dilated.  Palliative medicine team consulted to assist with complex medical decision making.  # Complex medical decision making/goals of care  - Patient lethargic during conversation though noted pain and difficulty breathing.  Discussed care with patient's husband at bedside.  He noted that he and patient have had extensive conversations about her quality of life and care planning due to her multiple medical illnesses throughout life.  While continuing appropriate medical management at this time to determine if patient will improve, also do not want patient to suffer.  Should patient continue to deteriorate, husband would want to be informed so can be transitioned to full comfort focused care and family can be present at bedside with patient at the end of life.  Currently allowing time for outcomes.  Palliative medicine team continuing to engage in conversations as able and appropriate moving forward.  -  Code Status: Limited: Do not attempt resuscitation (DNR) -DNR-LIMITED -Do Not Intubate/DNI     - DNR/DNI status was confirmed  # Symptom management Patient is receiving these palliative interventions for symptom management with an intent to improve quality of life.   - Shortness of breath/pain, in setting of multiple medical comorbidities including multilobar aspiration pneumonia   - Discontinue IV fentanyl    - Start IV Dilaudid  0.5-1 mg every hour as needed   - Continue as needed oxycodone  5 mg every 4 hours as needed   - Anxiety   - Continue IV Ativan  0.5 mg every 6 hours as needed  # Psycho-social/Spiritual Support:  - Support System: Husband, 37 year old son with autism, 1 stepdaughter, 1 daughter who is estranged - Desire for further Chaplain support:yes  #  Discharge Planning:  To Be Determined  Thank you for allowing the palliative care team to participate in the care Emmie CHRISTELLA Cedar.  Tinnie Radar, DO Palliative Care Provider PMT # [phone]  If patient remains symptomatic despite maximum doses, please call PMT at [phone] between 0700 and 1900. Outside of these hours, please call attending, as PMT does not have night coverage.  Billing based on MDM: High  Problems Addressed: One acute or chronic illness or injury that poses a threat to life or bodily function  Risks: Parenteral controlled substances

## 2024-06-25 NOTE — Progress Notes (Signed)
   06/25/24 1215  Spiritual Encounters  Type of Visit Initial  Care provided to: [facility] partners present during encounter Physician  Referral source Chaplain team  Reason for visit Urgent spiritual support  OnCall Visit No   Chaplain responded to a call for support for family. Patient was resting at the time of chaplain visit.  Family member expressed the reality of the patient's situation is changing quickly. It is hard on everyone all of us  but most difficult for the patient,[name]. He shared that support is being sought for their son as he processes his mothers situation. I provided a non-anxious presence and a space to express his feelings and his recounting of events, both past and future.   [facility]  [phone]

## 2024-06-25 NOTE — Progress Notes (Signed)
 Pt removed oxygen  and began crying. I encouraged patient to put the oxygen  back on and told her that her spo2 was 79%, but patient refused and said, give me a minute repeatedly. I asked patient to tell me how she was feeling. Pt stated, Cheated. I feel cheated. I've had cancer my whole life and for what? I never smoked, or drank, or did drugs. What did I do to deserve this? Pt also expressed fears that her prognosis and death would make her husband regret having married her and be unfair to both him and her son. I sat with patient and allowed her to express her grief and reminded her that having cancer didn't make her any less worthy of the love that her family has for her. Pt eventually agreed to put her oxygen  back on and spo2 returned to 89%.

## 2024-06-25 NOTE — Progress Notes (Addendum)
 [facility] Progress Note  CC:  N/V, dysphagia   Subjective: Patient is very drowsy after receiving Benadryl  for nausea at 8:56 AM. Her RN stated she had respiratory difficulties earlier this morning and is on BiPAP at this time.  She endorses having back pain.  No chest pain.  Objective:  Vital signs in last 24 hours: Temp:  [98.3 F (36.8 C)-99.1 F (37.3 C)] 98.3 F (36.8 C) (09/24 2325) Pulse Rate:  [98-120] 103 (09/25 0700) Resp:  [18-46] 32 (09/25 0700) BP: (87-159)/(51-92) 140/84 (09/25 0700) SpO2:  [65 %-99 %] 97 % (09/25 0700) FiO2 (%):  [85 %] 85 % (09/25 0516) Weight:  [80.7 kg] 80.7 kg (09/24 1726) Last BM Date : 06/21/24 General: Patient is drowsy but arousable. Heart: Mildly tachycardic, no murmurs. Pulm: Breath sounds coarse with few expiratory wheezes, on BiPAP. Abdomen: Soft, nondistended.  Nontender.  Positive bowel sounds to all 4 quadrants. Extremities: No lower extremity edema. Neurologic: Patient is drowsy after receiving Benadryl  but arousable, speech is a little garbled but comprehensible. She moves all extremities. Psych:  Alert and cooperative. Normal mood and affect.  Intake/Output from previous day: 09/24 0701 - 09/25 0700 In: 1524 [P.O.:240; I.V.:411.6; IV Piggyback:872.3] Out: 2250 [Urine:2250] Intake/Output this shift: No intake/output data recorded.  Lab Results: Recent Labs    06/23/24 1108 06/25/24 0559  WBC 7.2 13.3*  HGB 10.5* 9.8*  HCT 35.0* 31.9*  PLT 241 206   BMET Recent Labs    06/23/24 1108 06/25/24 0559  NA 143 143  K 4.2 3.4*  CL 112* 110  CO2 20* 21*  GLUCOSE 121* 133*  BUN 15 16  CREATININE 1.05* 1.06*  CALCIUM  8.3* 8.2*   LFT Recent Labs    06/25/24 0559  PROT 6.2*  ALBUMIN 3.4*  AST 33  ALT 56*  ALKPHOS 61  BILITOT 0.5   PT/INR Recent Labs    06/23/24 1108  LABPROT 13.5  INR 1.0   Hepatitis Panel No results for input(s): HEPBSAG, HCVAB, HEPAIGM, HEPBIGM in the last  72 hours.  DG CHEST PORT 1 VIEW Result Date: 06/24/2024 CLINICAL DATA:  Respiratory compromise EXAM: PORTABLE CHEST 1 VIEW COMPARISON:  06/24/2024 FINDINGS: Left PICC line remains in place, unchanged. Severe diffuse left lung airspace disease and patchy right lung airspace disease, worsening in the right upper lobe since prior study. No effusions or pneumothorax. No acute bony abnormality. IMPRESSION: Severe bilateral airspace disease, left greater than right, worsening in the right upper lobe since prior study. Electronically Signed   By: Franky Crease M.D.   On: 06/24/2024 17:20   DG CHEST PORT 1 VIEW Result Date: 06/24/2024 CLINICAL DATA:  Pneumonia EXAM: PORTABLE CHEST 1 VIEW COMPARISON:  June 23, 2024 and prior studies FINDINGS: Unchanged left-sided PICC line with tip in the mid SVC. Multifocal bilateral airspace opacities throughout the left lung and in the right perihilar region and right upper lung with improved aeration of the left lung. No definite new pneumothorax. No obvious pleural effusions. Cardiomediastinal silhouette is partially obscured, limiting evaluation. No acute osseous findings. IMPRESSION: Extensive multifocal airspace opacities bilaterally, left greater than right. Interval decrease in left lung opacities and slightly increased opacities in the right upper lung. Electronically Signed   By: Michaeline Blanch M.D.   On: 06/24/2024 14:33   DG Abd Portable 1V Result Date: 06/24/2024 CLINICAL DATA:  Vomiting EXAM: PORTABLE ABDOMEN - 1 VIEW COMPARISON:  June 21, 2024 CT FINDINGS: Nonobstructive bowel gas pattern.  No acute  osseous findings. IMPRESSION: Nonobstructive bowel gas pattern. Electronically Signed   By: Michaeline Blanch M.D.   On: 06/24/2024 14:29   DG Shoulder Left Port Result Date: 06/23/2024 CLINICAL DATA:  Fall, pain EXAM: LEFT SHOULDER COMPARISON:  None Available. FINDINGS: Degenerative changes in the [facility] joint with joint space narrowing and spurring. Glenohumeral joint  is maintained. No acute bony abnormality. Specifically, no fracture, subluxation, or dislocation. Soft tissues are intact. Left PICC line in place with the tip in the SVC. Diffuse airspace disease throughout the visualized left lung. IMPRESSION: Mild degenerative changes in the left AC joint. No acute bony abnormality. Diffuse airspace disease throughout the visualized left lung. Electronically Signed   By: Franky Crease M.D.   On: 06/23/2024 22:43   DG CHEST PORT 1 VIEW Result Date: 06/23/2024 CLINICAL DATA:  Chest pain EXAM: PORTABLE CHEST 1 VIEW COMPARISON:  06/21/2024 FINDINGS: Left PICC line in place with the tip in the SVC. Interval placement of right PICC line with the tip in the upper SVC at the confluence of the innominate veins. Heart mediastinal contours within normal limits. Marked worsening of diffuse left lung airspace disease and right perihilar opacities most compatible with pneumonia. No visible effusions or pneumothorax. IMPRESSION: Right PICC line placement with the tip in the upper SVC at the confluence of the innominate veins. Marked worsening in left lung airspace disease and right perihilar opacities concerning for pneumonia. Electronically Signed   By: Franky Crease M.D.   On: 06/23/2024 18:42   Patient Profile: [name] is a 53 y.o. 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   Assessment / Plan:  N/V and dysphagia with esophageal stenosis and recurrent esophageal candidiasis. EGD 9/23 showed a benign-appearing esophageal stenosis which was dilated, esophageal plaques suspicious for candidiasis, a widely patent Schatzki's ring, 2 cm hiatal hernia, gastritis and multiple gastric polyps.  Biopsies showed mild reflux, negative for candidiasis/lymphocytic esophagitis. She had aspiration event post EGD. - Continue Protonix  40 mg IV twice daily - On Micafungin  IV every day. Follow fungal C/S as outpt.  Appreciate ID input. - Continue soft diet as tolerated - To consider G-tube per IR once pulmonary status is stable - Palliative care consult in process - Await further recommendations per Dr. Charlanne  Acute on chronic anemia. Hg 10.5 -> 9.8. MCV 80.2. No overt GI bleeding. - CBC, iron, TIBC, ferritin and B12 in a.m.  Addison's disease, on chronic steroids at home. On stress dose Solu-Medrol  IV throughout this hospitalization.  Chronic hypoxic respiratory failure, multifocal pneumonia and post EGD aspiration. On Zosyn  IV.  CHF  AKI on CKD stage III  Elevated LFTs.  Noncontrast CT scan shows no biliary abnormalities.  Does show stable scattered subcentimeter hypodensities within the liver statistically most likely representing cyst or hemangiomas. AST 79 -> 74 -> 47 -> 33. ALT 136 ->119 -> 98 -> 56. Normal T. Bili and Alk phos levels.  Negative acute hepatitis panel. - Continue to trend LFTs  History of DVTs on  ASA  Left kidney complex lesion per CTAP  Leukocytosis.  WBC 13.3, secondary to hydrocortisone  IV  Hypokalemia - Management per the hospitalist  Principal Problem:   AKI (acute kidney injury) Active Problems:   Adrenal insufficiency     LOS: 4 days   Elida CHRISTELLA Shawl  06/25/2024, 9:53 AM   Attending physician's note   I have reviewed the chart. I performed a substantive portion of this encounter, including complete performance of at  least one of the key components, in conjunction with the APP. I agree with the Advanced Practitioner's note, impression and recommendations.  Patient taken turn for worse.  Acute on chronic progressive respiratory failure requiring BiPAP Multiorgan failure Multifocal pneumonia N/V/dysphagia with esophageal stricture/recurrent esophageal candidiasis Multiple comorbidities including Addison's disease, CHF, AKI on CKD  Agree with palliative care.  Heading towards comfort care. Appreciate Dr. Clayton Hold off on any further interventions.   Anselm Bring, MD [facility] [phone]

## 2024-06-25 NOTE — Progress Notes (Signed)
 PROGRESS NOTE    [name]  FMW:[id number] DOB: July 18, 1971 DOA: 06/21/2024 PCP: Toppin, Jean M, MD    Brief Narrative:  53 year old with history of Hodgkin's lymphoma at age 63 status post chemoradiation, breast cancer s/p chemotherapy and bilateral mastectomy in remission, cervical cancer, ovarian tumor status post TAH and left oophorectomy in 2011, right in 2016, pulmonary arterial hypertension, chronic hypoxemic failure on 3 to 5 L oxygen  at home, Addison's disease and history of migraine who was recently admitted with nausea vomiting treated for pneumonia and also found to have esophageal candidiasis presented with about 4 days of intractable nausea vomiting, unable to take any medications, abdominal discomfort. In the emergency room hemodynamically stable.  Hemoglobin 12.2.  Lipase 62.  Potassium 2.6 and magnesium  1.9.  Chest x-ray with airspace disease in the retrocardiac region on the left, atelectasis.  CT angiogram with no acute intra-abdominal process.  Bilateral nephrolithiasis without evidence of obstruction.  Complex lesion in the lower pole of the left kidney with indeterminate imaging characters.  Patient was given Solu-Cortef , lorazepam , potassium and Benadryl  and admitted to the hospital due to significant symptoms.  Patient stated not able to eat or drink for a long time as well unable to have meaningful water  intake.  Significant event, 9/23, came back from upper GI endoscopy.  Patient had sudden onset left-sided chest pain and shortness of breath.  She was found to have large airspace disease and possible aspiration.  Started on antibiotics. Upper GI endoscopy with persistent candidiasis and esophageal stricture-dilated. 9/24, increasing work of breathing.  Extensive multilobar pneumonia.  Transferred to stepdown unit with heated high flow. DNR/DNI.  Subjective:  Patient seen and examined.  She had just received 0.5 mg Ativan  at 4 AM, 25 mcg of fentanyl .  Patient was  sleepy however patient was tachypneic.  Currently on heated high flow.  Overnight events noted.  Discussed with nighttime nursing staff.  Patient had episodes of confusion, pain and anxiety and restlessness.  Currently comfortable.   Assessment & Plan:   Acute on chronic hypoxemic respiratory failure, postprocedure aspiration of gastric contents: Multifocal pneumonia.  Patient started on IV Zosyn  and will continue this.  She is already on IV micafungin . On increasing oxygen  need, 1 dose of Lasix  given 9/24. Currently developing ARDS like picture, will continue supportive care. BiPAP for respiratory distress. Chest physiotherapy, incentive spirometry, deep breathing exercises, sputum induction, mucolytic's and bronchodilators. Sputum cultures Supplemental oxygen  to keep saturations more than 90%.  Intractable nausea and vomiting: esophageal stricture and candidiasis.  Multiple previous treatment with antifungals.  Could not complete.  using Benadryl  and Ativan  for nausea as she is allergic to other nausea medications.  Electrolytes are adequate. On PPI, antifungal as below. Continue soft diet as tolerated.  Addison's disease: Currently no evidence of exacerbation.  Patient on Solu-Cortef  at home.  Currently remains on a stress dose of steroids until clinical stabilization.  Anxiety and depression: Significant symptoms.  Patient on BuSpar, Lamictal , Klonopin .  Continue this.  She also takes Topamax  for prevention of migraine.  Electrolytes: Adequate today.  Failure to thrive, esophageal candidiasis: ID to consult regarding candidiasis.  Started on micafungin . Previous discussion about G-tube placement, patient is not medically ready for surgical procedure.  Goal of care: See goal of care discussion from 9/24.  DNR/DNI.  BiPAP is acceptable.  Adequate symptom control medications.  Fentanyl  or Ativan  is acceptable. Patient continues to deteriorate.  There is clear plan for comfort care  pathway if she becomes very  uncomfortable and no chances of recovery. I will involve palliative care team to help coordinate care, educating counsel.    DVT prophylaxis: SCDs   Code Status: DNR/DNI Family Communication: None now. Disposition Plan: Status is: Inpatient Remains inpatient appropriate because: Significant symptoms, unable to eat, IV antibiotics, significant oxygen .     Consultants:  Gastroenterology IR Infectious disease Palliative care  Procedures:  Upper GI endoscopy  Antimicrobials:  Zosyn  9/23- Micafungin  9/23-     Objective: Vitals:   06/25/24 0500 06/25/24 0516 06/25/24 0600 06/25/24 0700  BP: (!) 159/83  (!) 144/80 (!) 140/84  Pulse: (!) 107  (!) 102 (!) 103  Resp: (!) 38  (!) 34 (!) 32  Temp:      TempSrc:      SpO2: 98% 98% 99% 97%  Weight:      Height:        Intake/Output Summary (Last 24 hours) at 06/25/2024 0733 Last data filed at 06/24/2024 2320 Gross per 24 hour  Intake 1523.97 ml  Output 2250 ml  Net -726.03 ml   Filed Weights   06/21/24 1955 06/21/24 2103 06/24/24 1726  Weight: 75.2 kg 75.2 kg 80.7 kg    Examination:  General exam: Currently medicated and sleepy.  Looks uncomfortable. Respiratory system: Poor inspiratory effort.  No palpable tenderness.  Tachypneic.  Very poor air entry bilateral.  Upper airway conducted sounds. SpO2: 97 % O2 Flow Rate (L/min): 45 L/min FiO2 (%): 85 %  Cardiovascular system: S1 & S2 heard, RRR. No JVD, murmurs, rubs, gallops or clicks.  Trace pedal edema. Gastrointestinal system: Abdomen is nondistended, soft and nontender. No organomegaly or masses felt. Normal bowel sounds heard. Central nervous system: Alert and oriented. No focal neurological deficits.  Grossly weak.    Data Reviewed: I have personally reviewed following labs and imaging studies  CBC: Recent Labs  Lab 06/21/24 1701 06/21/24 2200 06/22/24 0331 06/23/24 1108 06/25/24 0559  WBC 7.2 5.7 5.1 7.2 13.3*  NEUTROABS  3.9  --   --  5.5 11.6*  HGB 12.2 12.0 10.5* 10.5* 9.8*  HCT 38.5 42.2 34.0* 35.0* 31.9*  MCV 76.5* 82.4 78.7* 81.0 80.2  PLT 271 222 209 241 206   Basic Metabolic Panel: Recent Labs  Lab 06/21/24 1701 06/21/24 2200 06/22/24 0331 06/23/24 1108 06/25/24 0559  NA 139 137 140 143 143  K 2.6* 3.6 3.2* 4.2 3.4*  CL 98 103 105 112* 110  CO2 23 17* 21* 20* 21*  GLUCOSE 129* 110* 164* 121* 133*  BUN 27* 26* 20 15 16   CREATININE 1.93* 1.56* 1.43* 1.05* 1.06*  CALCIUM  9.6 8.3* 8.1* 8.3* 8.2*  MG 1.9 2.2  --  2.3 1.9  PHOS  --  2.8  --  1.7* 2.5   GFR: Estimated Creatinine Clearance: 64.4 mL/min (A) (by C-G formula based on SCr of 1.06 mg/dL (H)). Liver Function Tests: Recent Labs  Lab 06/21/24 1701 06/22/24 0331 06/23/24 1108 06/25/24 0559  AST 79* 74* 47* 33  ALT 136* 119* 98* 56*  ALKPHOS 99 79 75 61  BILITOT 0.4 <0.2 0.2 0.5  PROT 8.5* 6.9 6.4* 6.2*  ALBUMIN 4.8 4.0 3.9 3.4*   Recent Labs  Lab 06/21/24 1701  LIPASE 62*   No results for input(s): AMMONIA in the last 168 hours. Coagulation Profile: Recent Labs  Lab 06/23/24 1108  INR 1.0   Cardiac Enzymes: No results for input(s): CKTOTAL, CKMB, CKMBINDEX, TROPONINI in the last 168 hours. BNP (last 3 results) No results for  input(s): PROBNP in the last 8760 hours. HbA1C: No results for input(s): HGBA1C in the last 72 hours. CBG: Recent Labs  Lab 06/24/24 0747  GLUCAP 116*   Lipid Profile: No results for input(s): CHOL, HDL, LDLCALC, TRIG, CHOLHDL, LDLDIRECT in the last 72 hours. Thyroid  Function Tests: No results for input(s): TSH, T4TOTAL, FREET4, T3FREE, THYROIDAB in the last 72 hours. Anemia Panel: No results for input(s): VITAMINB12, FOLATE, FERRITIN, TIBC, IRON, RETICCTPCT in the last 72 hours. Sepsis Labs: Recent Labs  Lab 06/21/24 2200 06/22/24 0330  PROCALCITON <0.10  --   LATICACIDVEN  --  0.8    Recent Results (from the past 240 hours)   Culture, fungus without smear     Status: None (Preliminary result)   Collection Time: 06/23/24  1:23 PM   Specimen: Esophagus; Other  Result Value Ref Range Status   Specimen Description   Final    ESOPHAGUS Performed at [facility], [address] [place], [place] [address]    Special Requests   Final    NONE Performed at [facility], [address] [place], [place] [address]    Culture   Final    NO FUNGUS ISOLATED AFTER 1 DAY Performed at [facility], [address], [place], [place] [address]    Report Status PENDING  Incomplete  Respiratory (~20 pathogens) panel by PCR     Status: None   Collection Time: 06/24/24  1:18 PM   Specimen: Nasopharyngeal Swab; Respiratory  Result Value Ref Range Status   Adenovirus NOT DETECTED NOT DETECTED Final   Coronavirus 229E NOT DETECTED NOT DETECTED Final    Comment: (NOTE) The Coronavirus on the Respiratory Panel, DOES NOT test for the novel  Coronavirus (2019 nCoV)    Coronavirus HKU1 NOT DETECTED NOT DETECTED Final   Coronavirus NL63 NOT DETECTED NOT DETECTED Final   Coronavirus OC43 NOT DETECTED NOT DETECTED Final   Metapneumovirus NOT DETECTED NOT DETECTED Final   Rhinovirus / Enterovirus NOT DETECTED NOT DETECTED Final   Influenza A NOT DETECTED NOT DETECTED Final   Influenza B NOT DETECTED NOT DETECTED Final   Parainfluenza Virus 1 NOT DETECTED NOT DETECTED Final   Parainfluenza Virus 2 NOT DETECTED NOT DETECTED Final   Parainfluenza Virus 3 NOT DETECTED NOT DETECTED Final   Parainfluenza Virus 4 NOT DETECTED NOT DETECTED Final   Respiratory Syncytial Virus NOT DETECTED NOT DETECTED Final   Bordetella pertussis NOT DETECTED NOT DETECTED Final   Bordetella Parapertussis NOT DETECTED NOT DETECTED Final   Chlamydophila pneumoniae NOT DETECTED NOT DETECTED Final   Mycoplasma pneumoniae NOT DETECTED NOT DETECTED Final    Comment: Performed at [facility], [address] [place], [place] [address]  MRSA Next Gen by PCR, Nasal     Status: None   Collection Time: 06/24/24  5:44 PM   Specimen: Nasal Mucosa; Nasal Swab  Result Value Ref Range Status   MRSA by PCR Next Gen NOT DETECTED NOT DETECTED Final    Comment: (NOTE) The GeneXpert MRSA Assay (FDA approved for NASAL specimens only), is one component of a comprehensive MRSA colonization surveillance program. It is not intended to diagnose MRSA infection nor to guide or monitor treatment for MRSA infections. Test performance is not FDA approved in patients less than 5 years old. Performed at [facility], [address] [place], [place] [address]          Radiology Studies: DG CHEST PORT 1  VIEW Result Date: 06/24/2024 CLINICAL DATA:  Respiratory compromise EXAM: PORTABLE CHEST 1 VIEW COMPARISON:  06/24/2024 FINDINGS: Left PICC line remains in place, unchanged. Severe diffuse left lung airspace disease and patchy right lung airspace disease, worsening in the right upper lobe since prior study. No effusions or pneumothorax. No acute bony abnormality. IMPRESSION: Severe bilateral airspace disease, left greater than right, worsening in the right upper lobe since prior study. Electronically Signed   By: Franky Crease M.D.   On: 06/24/2024 17:20   DG CHEST PORT 1 VIEW Result Date: 06/24/2024 CLINICAL DATA:  Pneumonia EXAM: PORTABLE CHEST 1 VIEW COMPARISON:  June 23, 2024 and prior studies FINDINGS: Unchanged left-sided PICC line with tip in the mid SVC. Multifocal bilateral airspace opacities throughout the left lung and in the right perihilar region and right upper lung with improved aeration of the left lung. No definite new pneumothorax. No obvious pleural effusions. Cardiomediastinal silhouette is partially obscured, limiting evaluation. No acute osseous findings. IMPRESSION: Extensive multifocal airspace opacities bilaterally, left greater than right. Interval decrease in left lung  opacities and slightly increased opacities in the right upper lung. Electronically Signed   By: Michaeline Blanch M.D.   On: 06/24/2024 14:33   DG Abd Portable 1V Result Date: 06/24/2024 CLINICAL DATA:  Vomiting EXAM: PORTABLE ABDOMEN - 1 VIEW COMPARISON:  June 21, 2024 CT FINDINGS: Nonobstructive bowel gas pattern.  No acute osseous findings. IMPRESSION: Nonobstructive bowel gas pattern. Electronically Signed   By: Michaeline Blanch M.D.   On: 06/24/2024 14:29   DG Shoulder Left Port Result Date: 06/23/2024 CLINICAL DATA:  Fall, pain EXAM: LEFT SHOULDER COMPARISON:  None Available. FINDINGS: Degenerative changes in the [facility] joint with joint space narrowing and spurring. Glenohumeral joint is maintained. No acute bony abnormality. Specifically, no fracture, subluxation, or dislocation. Soft tissues are intact. Left PICC line in place with the tip in the SVC. Diffuse airspace disease throughout the visualized left lung. IMPRESSION: Mild degenerative changes in the left AC joint. No acute bony abnormality. Diffuse airspace disease throughout the visualized left lung. Electronically Signed   By: Franky Crease M.D.   On: 06/23/2024 22:43   DG CHEST PORT 1 VIEW Result Date: 06/23/2024 CLINICAL DATA:  Chest pain EXAM: PORTABLE CHEST 1 VIEW COMPARISON:  06/21/2024 FINDINGS: Left PICC line in place with the tip in the SVC. Interval placement of right PICC line with the tip in the upper SVC at the confluence of the innominate veins. Heart mediastinal contours within normal limits. Marked worsening of diffuse left lung airspace disease and right perihilar opacities most compatible with pneumonia. No visible effusions or pneumothorax. IMPRESSION: Right PICC line placement with the tip in the upper SVC at the confluence of the innominate veins. Marked worsening in left lung airspace disease and right perihilar opacities concerning for pneumonia. Electronically Signed   By: Franky Crease M.D.   On: 06/23/2024 18:42         Scheduled Meds:  albuterol   2.5 mg Nebulization Q6H   aspirin  EC  81 mg Oral Daily   budesonide  (PULMICORT ) nebulizer solution  0.25 mg Nebulization BID   buPROPion   150 mg Oral Daily   Chlorhexidine  Gluconate Cloth  6 each Topical Daily   feeding supplement  1 Container Oral TID BM   FLUoxetine   40 mg Oral Daily   hydrocortisone  sod succinate (SOLU-CORTEF ) inj  100 mg Intravenous Q8H   lamoTRIgine   200 mg Oral QHS   lamoTRIgine   50 mg Oral Daily  levothyroxine   100 mcg Oral q morning   OLANZapine   5 mg Oral QHS   pantoprazole  (PROTONIX ) IV  40 mg Intravenous Q12H   rosuvastatin   10 mg Oral Daily   sodium chloride  flush  10-40 mL Intracatheter Q12H   topiramate   150 mg Oral QHS   umeclidinium bromide   1 puff Inhalation Daily   Continuous Infusions:  micafungin  (MYCAMINE ) 150 mg in sodium chloride  0.9 % 100 mL IVPB Stopped (06/24/24 1448)   piperacillin -tazobactam (ZOSYN )  IV 3.375 g (06/25/24 0605)     LOS: 4 days    Time spent: 60 minutes   Renato Applebaum, MD [facility]

## 2024-06-25 NOTE — Plan of Care (Signed)
  Problem: Clinical Measurements: Goal: Will remain free from infection Outcome: Progressing   Problem: Education: Goal: Knowledge of General Education information will improve Description: Including pain rating scale, medication(s)/side effects and non-pharmacologic comfort measures Outcome: Not Progressing   Problem: Health Behavior/Discharge Planning: Goal: Ability to manage health-related needs will improve Outcome: Not Progressing   Problem: Clinical Measurements: Goal: Respiratory complications will improve Outcome: Not Progressing

## 2024-06-26 ENCOUNTER — Telehealth (HOSPITAL_COMMUNITY): Payer: Self-pay | Admitting: Pharmacy Technician

## 2024-06-26 ENCOUNTER — Other Ambulatory Visit (HOSPITAL_COMMUNITY): Payer: Self-pay

## 2024-06-26 DIAGNOSIS — L97518 Non-pressure chronic ulcer of other part of right foot with other specified severity: Secondary | ICD-10-CM

## 2024-06-26 DIAGNOSIS — R0603 Acute respiratory distress: Secondary | ICD-10-CM | POA: Diagnosis not present

## 2024-06-26 DIAGNOSIS — R638 Other symptoms and signs concerning food and fluid intake: Secondary | ICD-10-CM

## 2024-06-26 DIAGNOSIS — R0602 Shortness of breath: Secondary | ICD-10-CM | POA: Diagnosis not present

## 2024-06-26 DIAGNOSIS — R079 Chest pain, unspecified: Secondary | ICD-10-CM | POA: Diagnosis not present

## 2024-06-26 DIAGNOSIS — B3781 Candidal esophagitis: Secondary | ICD-10-CM | POA: Diagnosis not present

## 2024-06-26 DIAGNOSIS — Z515 Encounter for palliative care: Secondary | ICD-10-CM | POA: Diagnosis not present

## 2024-06-26 DIAGNOSIS — N179 Acute kidney failure, unspecified: Secondary | ICD-10-CM | POA: Diagnosis not present

## 2024-06-26 DIAGNOSIS — J690 Pneumonitis due to inhalation of food and vomit: Secondary | ICD-10-CM | POA: Diagnosis not present

## 2024-06-26 DIAGNOSIS — Y849 Medical procedure, unspecified as the cause of abnormal reaction of the patient, or of later complication, without mention of misadventure at the time of the procedure: Secondary | ICD-10-CM

## 2024-06-26 DIAGNOSIS — R4589 Other symptoms and signs involving emotional state: Secondary | ICD-10-CM | POA: Diagnosis not present

## 2024-06-26 LAB — VITAMIN B12: Vitamin B-12: 575 pg/mL (ref 180–914)

## 2024-06-26 LAB — IRON AND TIBC
Iron: <10 ug/dL — ABNORMAL LOW (ref 28–170)
TIBC: 242 ug/dL — ABNORMAL LOW (ref 250–450)

## 2024-06-26 LAB — CBC
HCT: 29 % — ABNORMAL LOW (ref 36.0–46.0)
Hemoglobin: 8.6 g/dL — ABNORMAL LOW (ref 12.0–15.0)
MCH: 24 pg — ABNORMAL LOW (ref 26.0–34.0)
MCHC: 29.7 g/dL — ABNORMAL LOW (ref 30.0–36.0)
MCV: 81 fL (ref 80.0–100.0)
Platelets: 178 K/uL (ref 150–400)
RBC: 3.58 MIL/uL — ABNORMAL LOW (ref 3.87–5.11)
RDW: 25.3 % — ABNORMAL HIGH (ref 11.5–15.5)
WBC: 12.4 K/uL — ABNORMAL HIGH (ref 4.0–10.5)
nRBC: 0 % (ref 0.0–0.2)

## 2024-06-26 LAB — FERRITIN: Ferritin: 93 ng/mL (ref 11–307)

## 2024-06-26 MED ORDER — FLUCONAZOLE 40 MG/ML PO SUSR
400.0000 mg | Freq: Once | ORAL | Status: AC
  Administered 2024-06-26: 400 mg via ORAL
  Filled 2024-06-26: qty 10

## 2024-06-26 MED ORDER — POTASSIUM CHLORIDE 20 MEQ PO PACK
20.0000 meq | PACK | Freq: Two times a day (BID) | ORAL | Status: DC
Start: 2024-06-26 — End: 2024-06-26

## 2024-06-26 MED ORDER — FLUCONAZOLE 100 MG PO TABS: 400.0000 mg | ORAL_TABLET | Freq: Once | ORAL | Status: DC

## 2024-06-26 MED ORDER — POTASSIUM CHLORIDE 10 MEQ/100ML IV SOLN
10.0000 meq | INTRAVENOUS | Status: AC
  Administered 2024-06-26 (×4): 10 meq via INTRAVENOUS
  Filled 2024-06-26 (×4): qty 100

## 2024-06-26 NOTE — Progress Notes (Signed)
 PROGRESS NOTE    [name]  FMW:[id number] DOB: August 10, 1971 DOA: 06/21/2024 PCP: Toppin, Jean M, MD    Brief Narrative:  53 year old with history of Hodgkin's lymphoma at age 29 status post chemoradiation, breast cancer s/p chemotherapy and bilateral mastectomy in remission, cervical cancer, ovarian tumor status post TAH and left oophorectomy in 2011, right in 2016, pulmonary arterial hypertension, chronic hypoxemic failure on 3 to 5 L oxygen  at home, Addison's disease and history of migraine who was recently admitted with nausea vomiting treated for pneumonia and also found to have esophageal candidiasis presented with about 4 days of intractable nausea vomiting, unable to take any medications, abdominal discomfort. In the emergency room hemodynamically stable.  Hemoglobin 12.2.  Lipase 62.  Potassium 2.6 and magnesium  1.9.  Chest x-ray with airspace disease in the retrocardiac region on the left, atelectasis.  CT angiogram with no acute intra-abdominal process.  Bilateral nephrolithiasis without evidence of obstruction.  Complex lesion in the lower pole of the left kidney with indeterminate imaging characters.  Patient was given Solu-Cortef , lorazepam , potassium and Benadryl  and admitted to the hospital due to significant symptoms.  Patient stated not able to eat or drink for a long time as well unable to have meaningful water  intake.  Significant event, 9/23, came back from upper GI endoscopy.  Patient had sudden onset left-sided chest pain and shortness of breath.  She was found to have large airspace disease and possible aspiration.  Started on antibiotics. Upper GI endoscopy with persistent candidiasis and esophageal stricture-dilated. 9/24, increasing work of breathing.  Extensive multilobar pneumonia.  Transferred to stepdown unit with heated high flow. DNR/DNI.  Subjective:  Patient was seen and examined.  She overall looked comfortable, she had a strong voice and was able to talk in  complete sentences.  She did not look tachypneic.  However, patient gets instantly emotional and today she is fixated on her Synthroid  dose not being given. Still on high flow oxygen  but saturating well. Patient tells me I do not feel good.  She wants to go home.  We discussed about weaning off oxygen  and getting to a comfortable level before going home and she is agreeable.  Husband at the bedside.   Assessment & Plan:   Acute on chronic hypoxemic respiratory failure, postprocedure aspiration of gastric contents: Multifocal pneumonia.  Currently on IV Zosyn .  Some clinical improvement today.  Continue.   She is also on micafungin  that is continued.   Symptomatic management, intermittent IV diuresis. Oxygen  to keep saturation more than 90%. BiPAP for respiratory distress. Start chest physiotherapy as much she can do.  Intractable nausea and vomiting: esophageal stricture and candidiasis.  Multiple previous treatment with antifungals.  Could not complete.  using Benadryl  and Ativan  for nausea as she is allergic to other nausea medications.  Electrolytes are adequate. On PPI, antifungal as below. Continue soft diet as tolerated.  Addison's disease: Currently no evidence of exacerbation.  Patient on Solu-Cortef  at home.  Currently remains on a stress dose of steroids until clinical stabilization.  Anxiety and depression: Significant symptoms.  Patient on BuSpar, Lamictal , Klonopin .  Continue this.  She also takes Topamax  for prevention of migraine.  Electrolytes: Adequate today.  Failure to thrive, esophageal candidiasis: ID to consult regarding candidiasis.  Started on micafungin . Previous discussion about G-tube placement, patient is not medically ready for surgical procedure. Patient will start Diflucan  today, will use liquid Diflucan  to see if she can tolerate so that she can be discharged on liquid  Diflucan .  Goal of care: See goal of care discussion from 9/24.  DNR/DNI.  BiPAP  is acceptable.  Adequate symptom control medications.   Will see progress over next 24 to 48 hours.  Hopefully she can go home with a stabilization.  If deterioration, plan is to serve her with comfort care. Appreciate palliative care follow-up.    DVT prophylaxis: SCDs   Code Status: DNR/DNI Family Communication: Husband at the bedside Disposition Plan: Status is: Inpatient Remains inpatient appropriate because: Significant symptoms, unable to eat, IV antibiotics, significant oxygen .     Consultants:  Gastroenterology IR Infectious disease Palliative care  Procedures:  Upper GI endoscopy  Antimicrobials:  Zosyn  9/23- Micafungin  9/23-     Objective: Vitals:   06/26/24 0800 06/26/24 0834 06/26/24 0853 06/26/24 0855  BP: 112/61     Pulse: 91  93   Resp: 18  (!) 21   Temp:  98.1 F (36.7 C)    TempSrc:  Oral    SpO2: 95%  96% 96%  Weight:      Height:        Intake/Output Summary (Last 24 hours) at 06/26/2024 1042 Last data filed at 06/26/2024 0400 Gross per 24 hour  Intake --  Output 1300 ml  Net -1300 ml   Filed Weights   06/21/24 1955 06/21/24 2103 06/24/24 1726  Weight: 75.2 kg 75.2 kg 80.7 kg    Examination:  General exam: Slightly anxious.  She is able to talk in complete sentences.  Less dyspneic today. Respiratory system:  Poor inspiratory effort.  Extensive expiratory wheezes and conducted upper airway sounds. SpO2: 96 % O2 Flow Rate (L/min): 40 L/min FiO2 (%): 70 %  Cardiovascular system: S1 & S2 heard, RRR. No JVD, murmurs, rubs, gallops or clicks.  No edema.  Gastrointestinal system: Abdomen is nondistended, soft and nontender. No organomegaly or masses felt. Normal bowel sounds heard. Central nervous system: Alert and oriented. No focal neurological deficits.  Grossly weak.    Data Reviewed: I have personally reviewed following labs and imaging studies  CBC: Recent Labs  Lab 06/21/24 1701 06/21/24 2200 06/22/24 0331 06/23/24 1108  06/25/24 0559 06/26/24 0625  WBC 7.2 5.7 5.1 7.2 13.3* 12.4*  NEUTROABS 3.9  --   --  5.5 11.6*  --   HGB 12.2 12.0 10.5* 10.5* 9.8* 8.6*  HCT 38.5 42.2 34.0* 35.0* 31.9* 29.0*  MCV 76.5* 82.4 78.7* 81.0 80.2 81.0  PLT 271 222 209 241 206 178   Basic Metabolic Panel: Recent Labs  Lab 06/21/24 1701 06/21/24 2200 06/22/24 0331 06/23/24 1108 06/25/24 0559  NA 139 137 140 143 143  K 2.6* 3.6 3.2* 4.2 3.4*  CL 98 103 105 112* 110  CO2 23 17* 21* 20* 21*  GLUCOSE 129* 110* 164* 121* 133*  BUN 27* 26* 20 15 16   CREATININE 1.93* 1.56* 1.43* 1.05* 1.06*  CALCIUM  9.6 8.3* 8.1* 8.3* 8.2*  MG 1.9 2.2  --  2.3 1.9  PHOS  --  2.8  --  1.7* 2.5   GFR: Estimated Creatinine Clearance: 64.4 mL/min (A) (by C-G formula based on SCr of 1.06 mg/dL (H)). Liver Function Tests: Recent Labs  Lab 06/21/24 1701 06/22/24 0331 06/23/24 1108 06/25/24 0559  AST 79* 74* 47* 33  ALT 136* 119* 98* 56*  ALKPHOS 99 79 75 61  BILITOT 0.4 <0.2 0.2 0.5  PROT 8.5* 6.9 6.4* 6.2*  ALBUMIN 4.8 4.0 3.9 3.4*   Recent Labs  Lab 06/21/24 1701  LIPASE  62*   No results for input(s): AMMONIA in the last 168 hours. Coagulation Profile: Recent Labs  Lab 06/23/24 1108  INR 1.0   Cardiac Enzymes: No results for input(s): CKTOTAL, CKMB, CKMBINDEX, TROPONINI in the last 168 hours. BNP (last 3 results) No results for input(s): PROBNP in the last 8760 hours. HbA1C: No results for input(s): HGBA1C in the last 72 hours. CBG: Recent Labs  Lab 06/24/24 0747  GLUCAP 116*   Lipid Profile: No results for input(s): CHOL, HDL, LDLCALC, TRIG, CHOLHDL, LDLDIRECT in the last 72 hours. Thyroid  Function Tests: No results for input(s): TSH, T4TOTAL, FREET4, T3FREE, THYROIDAB in the last 72 hours. Anemia Panel: Recent Labs    06/26/24 0625  VITAMINB12 575  TIBC 242*  IRON <10*   Sepsis Labs: Recent Labs  Lab 06/21/24 2200 06/22/24 0330  PROCALCITON <0.10  --    LATICACIDVEN  --  0.8    Recent Results (from the past 240 hours)  Culture, fungus without smear     Status: None (Preliminary result)   Collection Time: 06/23/24  1:23 PM   Specimen: Esophagus; Other  Result Value Ref Range Status   Specimen Description   Final    ESOPHAGUS Performed at [facility], [address] [place], [place] [address]    Special Requests   Final    NONE Performed at [facility], [address], [place], [place] [address]    Culture   Final    NO FUNGUS ISOLATED AFTER 2 DAYS Performed at [facility], [address], [place], [place] [address]    Report Status PENDING  Incomplete  Respiratory (~20 pathogens) panel by PCR     Status: None   Collection Time: 06/24/24  1:18 PM   Specimen: Nasopharyngeal Swab; Respiratory  Result Value Ref Range Status   Adenovirus NOT DETECTED NOT DETECTED Final   Coronavirus 229E NOT DETECTED NOT DETECTED Final    Comment: (NOTE) The Coronavirus on the Respiratory Panel, DOES NOT test for the novel  Coronavirus (2019 nCoV)    Coronavirus HKU1 NOT DETECTED NOT DETECTED Final   Coronavirus NL63 NOT DETECTED NOT DETECTED Final   Coronavirus OC43 NOT DETECTED NOT DETECTED Final   Metapneumovirus NOT DETECTED NOT DETECTED Final   Rhinovirus / Enterovirus NOT DETECTED NOT DETECTED Final   Influenza A NOT DETECTED NOT DETECTED Final   Influenza B NOT DETECTED NOT DETECTED Final   Parainfluenza Virus 1 NOT DETECTED NOT DETECTED Final   Parainfluenza Virus 2 NOT DETECTED NOT DETECTED Final   Parainfluenza Virus 3 NOT DETECTED NOT DETECTED Final   Parainfluenza Virus 4 NOT DETECTED NOT DETECTED Final   Respiratory Syncytial Virus NOT DETECTED NOT DETECTED Final   Bordetella pertussis NOT DETECTED NOT DETECTED Final   Bordetella Parapertussis NOT DETECTED NOT DETECTED Final   Chlamydophila pneumoniae NOT DETECTED NOT DETECTED Final   Mycoplasma pneumoniae NOT DETECTED NOT  DETECTED Final    Comment: Performed at [facility], [address] [place], [place] [address]  MRSA Next Gen by PCR, Nasal     Status: None   Collection Time: 06/24/24  5:44 PM   Specimen: Nasal Mucosa; Nasal Swab  Result Value Ref Range Status   MRSA by PCR Next Gen NOT DETECTED NOT DETECTED Final    Comment: (NOTE) The GeneXpert MRSA Assay (FDA approved for NASAL specimens only), is one component of a comprehensive MRSA colonization surveillance program. It is not intended to diagnose MRSA  infection nor to guide or monitor treatment for MRSA infections. Test performance is not FDA approved in patients less than 14 years old. Performed at [facility], [address], [place], [place] [address]          Radiology Studies: DG CHEST PORT 1 VIEW Result Date: 06/25/2024 CLINICAL DATA:  Pneumonia. EXAM: PORTABLE CHEST 1 VIEW COMPARISON:  Radiographs yesterday FINDINGS: Left upper extremity PICC tip in the SVC. Progression in heterogeneous bilateral lung opacities. Stable heart size and mediastinal contours. No pneumothorax. No large pleural effusion. Surgical clips in both axilla. IMPRESSION: Progression in heterogeneous bilateral lung opacities. This may represent pneumonia, edema, ARDS or combination there of. Electronically Signed   By: Andrea Gasman M.D.   On: 06/25/2024 10:55   DG CHEST PORT 1 VIEW Result Date: 06/24/2024 CLINICAL DATA:  Respiratory compromise EXAM: PORTABLE CHEST 1 VIEW COMPARISON:  06/24/2024 FINDINGS: Left PICC line remains in place, unchanged. Severe diffuse left lung airspace disease and patchy right lung airspace disease, worsening in the right upper lobe since prior study. No effusions or pneumothorax. No acute bony abnormality. IMPRESSION: Severe bilateral airspace disease, left greater than right, worsening in the right upper lobe since prior study. Electronically Signed   By: Franky Crease M.D.   On: 06/24/2024 17:20   DG CHEST  PORT 1 VIEW Result Date: 06/24/2024 CLINICAL DATA:  Pneumonia EXAM: PORTABLE CHEST 1 VIEW COMPARISON:  June 23, 2024 and prior studies FINDINGS: Unchanged left-sided PICC line with tip in the mid SVC. Multifocal bilateral airspace opacities throughout the left lung and in the right perihilar region and right upper lung with improved aeration of the left lung. No definite new pneumothorax. No obvious pleural effusions. Cardiomediastinal silhouette is partially obscured, limiting evaluation. No acute osseous findings. IMPRESSION: Extensive multifocal airspace opacities bilaterally, left greater than right. Interval decrease in left lung opacities and slightly increased opacities in the right upper lung. Electronically Signed   By: Michaeline Blanch M.D.   On: 06/24/2024 14:33        Scheduled Meds:  albuterol   2.5 mg Nebulization Q6H   aspirin  EC  81 mg Oral Daily   budesonide  (PULMICORT ) nebulizer solution  0.25 mg Nebulization BID   buPROPion   150 mg Oral Daily   Chlorhexidine  Gluconate Cloth  6 each Topical Daily   feeding supplement  1 Container Oral TID BM   FLUoxetine   40 mg Oral Daily   hydrocortisone  sod succinate (SOLU-CORTEF ) inj  100 mg Intravenous Q8H   lamoTRIgine   200 mg Oral QHS   lamoTRIgine   50 mg Oral Daily   levothyroxine   100 mcg Oral q morning   OLANZapine   5 mg Oral QHS   pantoprazole  (PROTONIX ) IV  40 mg Intravenous Q12H   rosuvastatin   10 mg Oral Daily   sodium chloride  flush  10-40 mL Intracatheter Q12H   topiramate   150 mg Oral QHS   umeclidinium bromide   1 puff Inhalation Daily   Continuous Infusions:  micafungin  (MYCAMINE ) 150 mg in sodium chloride  0.9 % 100 mL IVPB Stopped (06/26/24 1121)   piperacillin -tazobactam (ZOSYN )  IV Stopped (06/26/24 1008)     LOS: 5 days    Time spent: 55 minutes   Renato Applebaum, MD [facility]

## 2024-06-26 NOTE — Progress Notes (Signed)
 Date of Admission:  06/21/2024    ID: [name] is a 53 y.o. female  Principal Problem:   AKI (acute kidney injury) Active Problems:   Adrenal insufficiency   Palliative care encounter   High risk medication use   Pain   Need for emotional support   ACP (advance care planning)   Counseling and coordination of care   Shortness of breath   Acute hypoxic respiratory failure (HCC)   Medication management   Inadequate oral intake    Subjective: Pt is feeling better On nasal oxygen - was on Hi Flo before Has not eaten much except for a small cup of fruit  Medications:   albuterol   2.5 mg Nebulization Q6H   aspirin  EC  81 mg Oral Daily   budesonide  (PULMICORT ) nebulizer solution  0.25 mg Nebulization BID   buPROPion   150 mg Oral Daily   Chlorhexidine  Gluconate Cloth  6 each Topical Daily   feeding supplement  1 Container Oral TID BM   FLUoxetine   40 mg Oral Daily   hydrocortisone  sod succinate (SOLU-CORTEF ) inj  100 mg Intravenous Q8H   lamoTRIgine   200 mg Oral QHS   lamoTRIgine   50 mg Oral Daily   levothyroxine   100 mcg Oral q morning   OLANZapine   5 mg Oral QHS   pantoprazole  (PROTONIX ) IV  40 mg Intravenous Q12H   rosuvastatin   10 mg Oral Daily   sodium chloride  flush  10-40 mL Intracatheter Q12H   topiramate   150 mg Oral QHS   umeclidinium bromide   1 puff Inhalation Daily    Objective: Vital signs in last 24 hours: Patient Vitals for the past 24 hrs:  BP Temp Temp src Pulse Resp SpO2  06/26/24 1600 126/71 -- -- 86 16 96 %  06/26/24 1505 -- -- -- 96 18 94 %  06/26/24 1503 -- 97.9 F (36.6 C) Oral -- -- --  06/26/24 1200 -- -- -- (!) 108 17 90 %  06/26/24 1120 -- -- -- 98 (!) 23 94 %  06/26/24 1100 125/61 -- -- 82 17 96 %  06/26/24 0855 -- -- -- -- -- 96 %  06/26/24 0853 -- -- -- 93 (!) 21 96 %  06/26/24 0834 -- 98.1 F (36.7 C) Oral -- -- --  06/26/24 0800 112/61 -- -- 91 18 95 %  06/26/24 0700 123/75 -- -- 95 19 96 %  06/26/24 0600 119/66 -- -- 90 18  98 %  06/26/24 0500 (!) 105/57 -- -- 92 (!) 21 98 %  06/26/24 0400 123/71 98.4 F (36.9 C) Oral (!) 101 (!) 23 92 %  06/26/24 0300 136/71 -- -- 95 (!) 26 98 %  06/26/24 0200 123/66 -- -- 95 (!) 23 97 %  06/26/24 0100 125/68 -- -- 95 (!) 26 (!) 89 %  06/26/24 0000 114/78 -- -- 94 20 95 %  06/25/24 2355 -- -- -- -- -- 92 %  06/25/24 2352 -- 97.8 F (36.6 C) Axillary -- -- --  06/25/24 2000 110/80 -- -- 93 17 96 %  06/25/24 1900 117/68 -- -- 94 (!) 22 98 %  06/25/24 1843 -- -- -- 95 (!) 33 97 %  06/25/24 1800 (!) 108/59 -- -- 99 20 94 %       PHYSICAL EXAM:  General: Alert, cooperative, some distress, appears stated age.  Lungs: b/l  rhonchi, crepts Heart: tachycardia Abdomen: Soft, non-tender,not distended. Bowel sounds normal. No masses Extremities: atraumatic, no cyanosis. No edema. No  clubbing Skin: No rashes or lesions. Or bruising Lymph: Cervical, supraclavicular normal. Neurologic: Grossly non-focal Left PICC  Lab Results    Latest Ref Rng & Units 06/26/2024    6:25 AM 06/25/2024    5:59 AM 06/23/2024   11:08 AM  CBC  WBC 4.0 - 10.5 K/uL 12.4  13.3  7.2   Hemoglobin 12.0 - 15.0 g/dL 8.6  9.8  89.4   Hematocrit 36.0 - 46.0 % 29.0  31.9  35.0   Platelets 150 - 400 K/uL 178  206  241        Latest Ref Rng & Units 06/25/2024    5:59 AM 06/23/2024   11:08 AM 06/22/2024    3:31 AM  CMP  Glucose 70 - 99 mg/dL 866  878  835   BUN 6 - 20 mg/dL 16  15  20    Creatinine 0.44 - 1.00 mg/dL 8.93  8.94  8.56   Sodium 135 - 145 mmol/L 143  143  140   Potassium 3.5 - 5.1 mmol/L 3.4  4.2  3.2   Chloride 98 - 111 mmol/L 110  112  105   CO2 22 - 32 mmol/L 21  20  21    Calcium  8.9 - 10.3 mg/dL 8.2  8.3  8.1   Total Protein 6.5 - 8.1 g/dL 6.2  6.4  6.9   Total Bilirubin 0.0 - 1.2 mg/dL 0.5  0.2  <9.7   Alkaline Phos 38 - 126 U/L 61  75  79   AST 15 - 41 U/L 33  47  74   ALT 0 - 44 U/L 56  98  119       Microbiology:  Studies/Results:  B/l infiltrate lung  DG CHEST PORT 1  VIEW Result Date: 06/25/2024 CLINICAL DATA:  Pneumonia. EXAM: PORTABLE CHEST 1 VIEW COMPARISON:  Radiographs yesterday FINDINGS: Left upper extremity PICC tip in the SVC. Progression in heterogeneous bilateral lung opacities. Stable heart size and mediastinal contours. No pneumothorax. No large pleural effusion. Surgical clips in both axilla. IMPRESSION: Progression in heterogeneous bilateral lung opacities. This may represent pneumonia, edema, ARDS or combination there of. Electronically Signed   By: Andrea Gasman M.D.   On: 06/25/2024 10:55     Assessment/Plan:  Recurrent esophageal candidiasis It could be because of the stenosis and strictures and Schatzki's ring.  There could be an element of GERD which puts her at risk for esophageal candidiasis She has been treated with multiple courses of fluconazole  though at a lower dose of 200 mg This time during the endoscopy  fungal culture and susceptibility has been sent.  This will tell us  whether it is albicans or not albicans species and also whether susceptible to Azoles  On IV echinocandin  new EKG  QTc okay   can switch to fluconazole  at a higher dose of 400 mg  once she is able to swallow well and also tolerating food Antifungal until 07/13/24   Acute respiratory distress and severe chest pain on the left side post procedure Severe aspiration pneumonitis /ARDS With underlying b/l lower lobe infiltrate No esophageal perforation  or mediastinitis  on Zosyn  for aspiration pneumonia  Resp panel PCR neg     History of multiple malignancies All in remission CA breast status post bilateral mastectomies and reconstruction Thyroid  malignancy with thyroidectomy and now hypothyroid and on supplement History of Hodgkin's lymphoma     Adrenal insufficiency on hydrocortisone  This was secondary to pituitary adenoma removal   Anemia  AKI improving   Transaminitis   Nonhealing ulcer on the dorsum of the right foot covered with eschar  is controlled margins.  Slightly raised This is not typical for pyoderma gangrenosum Ecthyma gangrenosum May need biopsy   History of multiple bacteremias related to port infection The port was removed a few months ago She now has a PICC line   Discussed the management with patient and her husband. Discussed with hospitalist I will follow her as OP if needed -appt given for 07/15/24 ID will sign off- call if needed

## 2024-06-26 NOTE — Telephone Encounter (Signed)
 Patient Product/process development scientist completed.    The patient is insured through [facility]. Patient has [employer], may use a copay card, and/or apply for patient assistance if available.    Ran test claim for fluconazole  10 mg/ ml and the current 30 day co-pay is $0.00.   This test claim was processed through [facility]- copay amounts may vary at other pharmacies due to pharmacy/plan contracts, or as the patient moves through the different stages of their insurance plan.     Reyes Sharps, CPHT Pharmacy Technician III Certified Patient Advocate [facility] Patient Advocate Team Direct Number: [phone]  Fax: [phone]

## 2024-06-26 NOTE — Progress Notes (Signed)
 Daily Progress Note   Patient Name: [name]       Date: 06/26/2024 DOB: 1971/09/11  Age: 53 y.o. MRN#: [id number] Attending Physician: Raenelle Coria, MD Primary Care Physician: Toppin, Jean M, MD Admit Date: 06/21/2024 Length of Stay: 5 days  Reason for Consultation/Follow-up: Establishing goals of care  Subjective:   CC: Patient lethargic and moaning in pain at certain times.  Following up regarding complex medical decision making.  Subjective:  Reviewed EMR including recent documentation from GI and hospitalist.  At time of EMR review past 24 hours patient has received as needed IV Dilaudid  1 mg x 8 doses and as needed Ativan  0.5 mg x 2 doses.  At time of EMR patient requiring support via HHFNC 40 L/min and FiO2 70%. Discussed care with RN for medical updates.  Patient at times continues to remove HHFNC and quickly has oxygen  saturation dropped down to 60s.  Patient's work of breathing and heart rate have been better controlled with use of IV Dilaudid  for symptom management.  Noted patient had episode of severe anxiety this morning about Synthroid  dose.  Hospitalist worked with patient to ease concerns regarding this.  Presented to bedside to see patient.  Patient laying in bed in while she will awaken, quickly falls back asleep.  It is noted that patient appears more comfortable with decreased work of breathing as compared to yesterday.  Patient's husband and parents present at bedside.  Again introduced myself as a member of the palliative medicine team.  Able to discuss care for today.  Spent time reviewing her respiratory status and her still requiring lots of support of oxygen .  Discussed RN appropriately trying to wean at times though this is slow-growing and patient does still quickly desaturate when she herself removes the oxygen .  Discussed patient's symptoms are better managed with the use of IV Dilaudid  and patient required 8 mg in the past 24 hours to assist with symptom  management.  Also discussed patient has had minimal oral intake which is worrisome and also concerning that patient is growing tired.  At this time discussed plan moving forward.  Still allowing time for outcomes while continuing appropriate medical management.  Again patient herself and family support that patient would not want to suffer so continuing appropriate symptom management medications.  Discussed if patient's body continues to weaken and she is needing more frequent administration of IV opioids to help with symptom management, may need to consider continuous IV infusion.  Discussed this would be considered if patient was full comfort focused care.  Spent time explaining full comfort focused care and that would entail discontinuing aggressive medical interventions to instead focus on patient's comfort at end-of-life.  Spent time discussing what a terminal high flow wean would look like.  Spent time answering their questions regarding this.  Husband and patient's parents acknowledging severity of patient's underlying disease and that if she is not improving, may die in the hospital.  Again should patient acutely deteriorate, family would want to be informed so could come to bedside at end-of-life.  For now continuing appropriate medical interventions.  Noted this PMT provider would continue discussions even over the weekend as closely monitoring how patient is going to do moving forward.  If patient deteriorates even today, discussed full comfort focused care at that time.  All questions answered at that time.  Provided emotional support via active listening.  Discussed care with hospitalist, RN, GI, and chaplain coordinate care.  Objective:   Vital Signs:  BP 119/66   Pulse 90   Temp 98.4 F (36.9 C) (Oral)   Resp 18   Ht 5' 5 (1.651 m)   Wt 80.7 kg   LMP  (LMP Unknown)   SpO2 98%   BMI 29.61 kg/m   Physical Exam: General: Will awaken though quickly falls asleep, ill-appearing,  moaning at times Cardiovascular: RRR Respiratory: no increased work of breathing noted, on HHFNC 40 L/min at FiO2 70%  Assessment & Plan:   Assessment: Patient is a 53 year old female with a past medical history of Hodgkin's lymphoma at age 73 status post chemoradiation, breast cancer status postchemotherapy and bilateral mastectomy in remission, cervical cancer in remission, and ovarian tumor status post TAH and left oophorectomy in 2011 and right oophorectomy in 2016, pulmonary arterial hypertension, Addison's disease, migraines, and chronic hypoxic respiratory failure on 3-5 L oxygen  at home who was admitted on 06/21/2024 for management of intractable nausea and vomiting associated with abdominal discomfort. During hospitalization, patient underwent upper GI endoscopy on 06/23/2024 for further workup. After this patient had sudden onset of left-sided chest pain and shortness of breath and was found to have large airspace disease in postprocedural aspiration leading to multifocal pneumonia. EGD did show persistent candidiasis and esophageal stricture present which was dilated. Palliative medicine team consulted to assist with complex medical decision making.   Recommendations/Plan: # Complex medical decision making/goals of care:     - Patient will awaken though easily falling asleep.  Discussed care with patient's husband and parents as detailed above in HPI.  Again reviewed patient's serious medical illness.  At this time continuing appropriate medical interventions to allow time to see if patient will be able to survive this.  Have extensively discussed with patient and family concern that patient is critically ill and she may not be able to overcome this and may end up dying in the hospital.  Patient and family are acknowledging of this and while patient is receiving appropriate medical interventions, I do not want her to suffer with pain or anxiety.  Discussed should patient worsen, would recommend  transition to full comfort focused care.  Full comfort focused care was explained in detail including terminal high flow wean.  Palliative medicine team continuing to follow along to engage in conversations to assist with care planning moving forward.                -  Code Status: Limited: Do not attempt resuscitation (DNR) -DNR-LIMITED -Do Not Intubate/DNI     # Symptom management Patient is receiving these palliative interventions for symptom management with an intent to improve quality of life.                 - Shortness of breath/pain, in setting of multiple medical comorbidities including multilobar aspiration pneumonia                               -Continue IV Dilaudid  0.5-1 mg every hour as needed                               - Continue as needed oxycodone  5 mg every 4 hours as needed                  - Anxiety                               -  Continue IV Ativan  0.5 mg every 6 hours as needed   # Psycho-social/Spiritual Support:  - Support System: Husband, 17 year old son with autism, 1 stepdaughter, 1 daughter who is estranged - Desire for further Chaplain support:yes   # Discharge Planning:  To Be Determined   Discussed with: Patient, patient's husband, patient's parents, RN, hospitalist, GI, chaplain  Thank you for allowing the palliative care team to participate in the care Emmie CHRISTELLA Cedar.  Tinnie Radar, DO Palliative Care Provider PMT # [phone]  If patient remains symptomatic despite maximum doses, please call PMT at [phone] between 0700 and 1900. Outside of these hours, please call attending, as PMT does not have night coverage.  Personally spent 65 minutes in patient care including extensive chart review (labs, imaging, progress/consult notes, vital signs), medically appropraite exam, discussed with treatment team, education to patient, family, and staff, documenting clinical information, medication review and management, coordination of care, and available  advanced directive documents.

## 2024-06-26 NOTE — Progress Notes (Signed)
 [facility] Progress Note  CC:  N/V, dysphagia   Subjective: Mother, father and husband at the bedside.  She stated everything hurts.  She is taking sips of water  with meds otherwise no food intake today.  No nausea or vomiting.  Objective:  Vital signs in last 24 hours: Temp:  [97.8 F (36.6 C)-98.4 F (36.9 C)] 98.1 F (36.7 C) (09/26 0834) Pulse Rate:  [89-106] 93 (09/26 0853) Resp:  [17-33] 21 (09/26 0853) BP: (103-136)/(56-87) 112/61 (09/26 0800) SpO2:  [89 %-100 %] 96 % (09/26 0855) FiO2 (%):  [60 %-80 %] 70 % (09/26 0855) Last BM Date : 06/25/24 General: Ill-appearing 53 year old female, she is more alert today. Heart: Regular rate and rhythm, no murmurs. Pulm: On BiPAP.  Labored breathing.  Breath sounds coarse with scattered expiratory wheezes. Abdomen: : Soft, nondistended.  Nontender.  Positive bowel sounds to all 4 quadrants.  Extremities: No lower extremity edema. Neurologic:  Alert and more awake this morning.  Speech is clear.  She moves all extremities weakly.  Intake/Output from previous day: 09/25 0701 - 09/26 0700 In: 10 [I.V.:10] Out: 1950 [Urine:1950] Intake/Output this shift: No intake/output data recorded.  Lab Results: Recent Labs    06/23/24 1108 06/25/24 0559 06/26/24 0625  WBC 7.2 13.3* 12.4*  HGB 10.5* 9.8* 8.6*  HCT 35.0* 31.9* 29.0*  PLT 241 206 178   BMET Recent Labs    06/23/24 1108 06/25/24 0559  NA 143 143  K 4.2 3.4*  CL 112* 110  CO2 20* 21*  GLUCOSE 121* 133*  BUN 15 16  CREATININE 1.05* 1.06*  CALCIUM  8.3* 8.2*   LFT Recent Labs    06/25/24 0559  PROT 6.2*  ALBUMIN 3.4*  AST 33  ALT 56*  ALKPHOS 61  BILITOT 0.5   PT/INR Recent Labs    06/23/24 1108  LABPROT 13.5  INR 1.0   Hepatitis Panel No results for input(s): HEPBSAG, HCVAB, HEPAIGM, HEPBIGM in the last 72 hours.  DG CHEST PORT 1 VIEW Result Date: 06/25/2024 CLINICAL DATA:  Pneumonia. EXAM: PORTABLE CHEST 1 VIEW  COMPARISON:  Radiographs yesterday FINDINGS: Left upper extremity PICC tip in the SVC. Progression in heterogeneous bilateral lung opacities. Stable heart size and mediastinal contours. No pneumothorax. No large pleural effusion. Surgical clips in both axilla. IMPRESSION: Progression in heterogeneous bilateral lung opacities. This may represent pneumonia, edema, ARDS or combination there of. Electronically Signed   By: Andrea Gasman M.D.   On: 06/25/2024 10:55   DG CHEST PORT 1 VIEW Result Date: 06/24/2024 CLINICAL DATA:  Respiratory compromise EXAM: PORTABLE CHEST 1 VIEW COMPARISON:  06/24/2024 FINDINGS: Left PICC line remains in place, unchanged. Severe diffuse left lung airspace disease and patchy right lung airspace disease, worsening in the right upper lobe since prior study. No effusions or pneumothorax. No acute bony abnormality. IMPRESSION: Severe bilateral airspace disease, left greater than right, worsening in the right upper lobe since prior study. Electronically Signed   By: Franky Crease M.D.   On: 06/24/2024 17:20   DG CHEST PORT 1 VIEW Result Date: 06/24/2024 CLINICAL DATA:  Pneumonia EXAM: PORTABLE CHEST 1 VIEW COMPARISON:  June 23, 2024 and prior studies FINDINGS: Unchanged left-sided PICC line with tip in the mid SVC. Multifocal bilateral airspace opacities throughout the left lung and in the right perihilar region and right upper lung with improved aeration of the left lung. No definite new pneumothorax. No obvious pleural effusions. Cardiomediastinal silhouette is partially obscured, limiting evaluation. No acute  osseous findings. IMPRESSION: Extensive multifocal airspace opacities bilaterally, left greater than right. Interval decrease in left lung opacities and slightly increased opacities in the right upper lung. Electronically Signed   By: Michaeline Blanch M.D.   On: 06/24/2024 14:33   Patient Profile: TONJI ELLIFF is a 53 y.o. 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   Assessment / Plan:  N/V and dysphagia with esophageal stenosis and recurrent esophageal candidiasis. EGD 9/23 showed a benign-appearing esophageal stenosis which was dilated, esophageal plaques suspicious for candidiasis, a widely patent Schatzki's ring, 2 cm hiatal hernia, gastritis and multiple gastric polyps.  Biopsies showed mild reflux, negative for candidiasis/lymphocytic esophagitis. She had aspiration event post EGD. - Continue Protonix  40 mg IV twice daily - On Micafungin  IV every day. Follow fungal C/S as outpt.  Appreciate ID input. - Continue soft diet as tolerated - To consider G-tube per IR once pulmonary status is stable - Palliative care following, patient is DNR/DNI - Await further recommendations per Dr. Charlanne   Acute on chronic anemia. Hg 10.5 -> 9.8 -> 8.6. Iron < 10. TIBC 242.  Ferritin pending.  B12 level 575. No overt GI bleeding.  - Consider IV iron, defer to the hospitalist   Addison's disease, on chronic steroids at home. On stress dose Solu-Medrol  IV throughout this hospitalization.   Chronic hypoxic respiratory failure, multifocal pneumonia and post EGD aspiration. On Zosyn  IV.   CHF   AKI on CKD stage III   Elevated LFTs.  Noncontrast CT scan shows no biliary abnormalities.  Does show stable scattered subcentimeter hypodensities within the liver  statistically most likely representing cyst or hemangiomas. AST 79 -> 74 -> 47 -> 33. ALT 136 ->119 -> 98 -> 56. Normal T. Bili and Alk phos levels.  Negative acute hepatitis panel. - Hepatic panel in am   History of DVTs on ASA   Left kidney complex lesion per CTAP   Leukocytosis.  WBC 13.3, secondary to Hydrocortisone  IV   Hypokalemia - Management per the hospitalist    Principal Problem:   AKI (acute kidney injury) Active Problems:   Adrenal insufficiency   Palliative care encounter   High risk medication use   Pain   Need for emotional support   ACP (advance care planning)   Counseling and coordination of care   Shortness of breath   Acute hypoxic respiratory failure (HCC)   Medication management     LOS: 5 days   Elida CHRISTELLA Shawl  06/26/2024, 10:45 AM

## 2024-06-27 ENCOUNTER — Inpatient Hospital Stay (HOSPITAL_COMMUNITY)

## 2024-06-27 DIAGNOSIS — R52 Pain, unspecified: Secondary | ICD-10-CM

## 2024-06-27 DIAGNOSIS — J690 Pneumonitis due to inhalation of food and vomit: Secondary | ICD-10-CM

## 2024-06-27 DIAGNOSIS — R638 Other symptoms and signs concerning food and fluid intake: Secondary | ICD-10-CM

## 2024-06-27 DIAGNOSIS — R1319 Other dysphagia: Secondary | ICD-10-CM

## 2024-06-27 DIAGNOSIS — R0602 Shortness of breath: Secondary | ICD-10-CM

## 2024-06-27 DIAGNOSIS — N179 Acute kidney failure, unspecified: Secondary | ICD-10-CM | POA: Diagnosis not present

## 2024-06-27 DIAGNOSIS — Z515 Encounter for palliative care: Secondary | ICD-10-CM

## 2024-06-27 DIAGNOSIS — R4589 Other symptoms and signs involving emotional state: Secondary | ICD-10-CM | POA: Diagnosis not present

## 2024-06-27 DIAGNOSIS — Y849 Medical procedure, unspecified as the cause of abnormal reaction of the patient, or of later complication, without mention of misadventure at the time of the procedure: Secondary | ICD-10-CM

## 2024-06-27 DIAGNOSIS — Z79899 Other long term (current) drug therapy: Secondary | ICD-10-CM

## 2024-06-27 DIAGNOSIS — E2740 Unspecified adrenocortical insufficiency: Secondary | ICD-10-CM

## 2024-06-27 DIAGNOSIS — Z7189 Other specified counseling: Secondary | ICD-10-CM

## 2024-06-27 LAB — CBC WITH DIFFERENTIAL/PLATELET
Abs Immature Granulocytes: 0.08 K/uL — ABNORMAL HIGH (ref 0.00–0.07)
Basophils Absolute: 0 K/uL (ref 0.0–0.1)
Basophils Relative: 0 %
Eosinophils Absolute: 0 K/uL (ref 0.0–0.5)
Eosinophils Relative: 0 %
HCT: 28.4 % — ABNORMAL LOW (ref 36.0–46.0)
Hemoglobin: 8.3 g/dL — ABNORMAL LOW (ref 12.0–15.0)
Immature Granulocytes: 1 %
Lymphocytes Relative: 3 %
Lymphs Abs: 0.3 K/uL — ABNORMAL LOW (ref 0.7–4.0)
MCH: 24.1 pg — ABNORMAL LOW (ref 26.0–34.0)
MCHC: 29.2 g/dL — ABNORMAL LOW (ref 30.0–36.0)
MCV: 82.3 fL (ref 80.0–100.0)
Monocytes Absolute: 0.5 K/uL (ref 0.1–1.0)
Monocytes Relative: 4 %
Neutro Abs: 11.4 K/uL — ABNORMAL HIGH (ref 1.7–7.7)
Neutrophils Relative %: 92 %
Platelets: 198 K/uL (ref 150–400)
RBC: 3.45 MIL/uL — ABNORMAL LOW (ref 3.87–5.11)
RDW: 25.2 % — ABNORMAL HIGH (ref 11.5–15.5)
WBC: 12.3 K/uL — ABNORMAL HIGH (ref 4.0–10.5)
nRBC: 0.2 % (ref 0.0–0.2)

## 2024-06-27 LAB — BASIC METABOLIC PANEL WITH GFR
Anion gap: 10 (ref 5–15)
BUN: 20 mg/dL (ref 6–20)
CO2: 24 mmol/L (ref 22–32)
Calcium: 8.9 mg/dL (ref 8.9–10.3)
Chloride: 104 mmol/L (ref 98–111)
Creatinine, Ser: 0.84 mg/dL (ref 0.44–1.00)
GFR, Estimated: >60 mL/min (ref >60)
Glucose, Bld: 167 mg/dL — ABNORMAL HIGH (ref 70–99)
Potassium: 4 mmol/L (ref 3.5–5.1)
Sodium: 138 mmol/L (ref 135–145)

## 2024-06-27 MED ORDER — LORAZEPAM 2 MG/ML IJ SOLN
1.0000 mg | INTRAMUSCULAR | Status: DC | PRN
  Administered 2024-06-27: 1 mg via INTRAVENOUS
  Filled 2024-06-27: qty 1

## 2024-06-27 MED ORDER — FUROSEMIDE 10 MG/ML IJ SOLN
40.0000 mg | Freq: Once | INTRAMUSCULAR | Status: AC
  Administered 2024-06-27: 40 mg via INTRAVENOUS
  Filled 2024-06-27: qty 4

## 2024-06-27 MED ORDER — BIOTENE DRY MOUTH MT LIQD: 15.0000 mL | OROMUCOSAL | Status: DC | PRN

## 2024-06-27 MED ORDER — PROPOFOL 1000 MG/100ML IV EMUL
5.0000 ug/kg/min | INTRAVENOUS | Status: DC
  Administered 2024-06-27: 5 ug/kg/min via INTRAVENOUS
  Filled 2024-06-27: qty 100

## 2024-06-27 MED ORDER — POLYVINYL ALCOHOL 1.4 % OP SOLN: 1.0000 [drp] | Freq: Four times a day (QID) | OPHTHALMIC | Status: DC | PRN

## 2024-06-27 MED ORDER — HYDROCORTISONE SOD SUC (PF) 100 MG IJ SOLR
100.0000 mg | Freq: Two times a day (BID) | INTRAMUSCULAR | Status: DC
Start: 2024-06-27 — End: 2024-06-27

## 2024-06-27 MED ORDER — HYDROMORPHONE BOLUS VIA INFUSION
1.0000 mg | INTRAVENOUS | Status: DC | PRN
  Administered 2024-06-27: 1 mg via INTRAVENOUS
  Administered 2024-06-27: 3 mg via INTRAVENOUS
  Administered 2024-06-27: 1 mg via INTRAVENOUS
  Administered 2024-06-27: 2 mg via INTRAVENOUS
  Administered 2024-06-27: 1 mg via INTRAVENOUS
  Administered 2024-06-27: 2 mg via INTRAVENOUS

## 2024-06-27 MED ORDER — GLYCOPYRROLATE 0.2 MG/ML IJ SOLN
0.2000 mg | INTRAMUSCULAR | Status: DC | PRN
  Administered 2024-06-27: 0.2 mg via INTRAVENOUS
  Filled 2024-06-27: qty 1

## 2024-06-27 MED ORDER — LORAZEPAM 2 MG/ML IJ SOLN
0.5000 mg | Freq: Four times a day (QID) | INTRAMUSCULAR | Status: DC | PRN
Start: 2024-06-27 — End: 2024-06-27

## 2024-06-27 MED ORDER — HYDROMORPHONE HCL 1 MG/ML IJ SOLN
2.0000 mg | INTRAMUSCULAR | Status: DC | PRN
Start: 2024-06-27 — End: 2024-06-28

## 2024-06-27 MED ORDER — DIPHENHYDRAMINE HCL 50 MG/ML IJ SOLN
25.0000 mg | INTRAMUSCULAR | Status: DC | PRN
  Administered 2024-06-27: 25 mg via INTRAVENOUS
  Filled 2024-06-27: qty 1

## 2024-06-27 MED ORDER — LORAZEPAM 1 MG PO TABS: 1.0000 mg | ORAL_TABLET | Freq: Four times a day (QID) | ORAL | Status: DC | PRN

## 2024-06-27 MED ORDER — POTASSIUM CHLORIDE 10 MEQ/100ML IV SOLN
10.0000 meq | INTRAVENOUS | Status: DC
  Filled 2024-06-27: qty 100

## 2024-06-27 MED ORDER — HALOPERIDOL LACTATE 5 MG/ML IJ SOLN: 2.0000 mg | INTRAMUSCULAR | Status: DC | PRN

## 2024-06-27 MED ORDER — HYDROMORPHONE HCL-NACL 50-0.9 MG/50ML-% IV SOLN
1.0000 mg/h | INTRAVENOUS | Status: DC
  Administered 2024-06-27: 6 mg/h via INTRAVENOUS
  Administered 2024-06-27: 1 mg/h via INTRAVENOUS
  Administered 2024-06-27: 2 mg/h via INTRAVENOUS
  Administered 2024-06-27: 1 mg/h via INTRAVENOUS
  Filled 2024-06-27 (×2): qty 50

## 2024-07-01 ENCOUNTER — Telehealth (HOSPITAL_BASED_OUTPATIENT_CLINIC_OR_DEPARTMENT_OTHER): Payer: Self-pay

## 2024-07-01 NOTE — Death Summary Note (Signed)
 DEATH SUMMARY   Patient Details  Name: [name] MRN: [id number] DOB: [birth year] ERE:[name], [name], MD Admission/Discharge Information   Admit Date:  2024/07/16  Date of Death: Date of Death: 07-22-24  Time of Death: Time of Death: 07-31-44  Length of Stay: 6   Principle Cause of death: Acute respiratory distress secondary to ARDS, aspiration pneumonia. Metabolic encephalopathy Severe protein calorie malnutrition Addison's disease Failure to thrive, esophageal candidiasis  Hospital Diagnoses: Principal Problem:   AKI (acute kidney injury) Active Problems:   Adrenal insufficiency   Palliative care encounter   High risk medication use   Pain   Need for emotional support   ACP (advance care planning)   Counseling and coordination of care   Shortness of breath   Acute hypoxic respiratory failure (HCC)   Medication management   Inadequate oral intake   Aspiration pneumonitis after procedure (HCC)   End of life care   Hospital Course: 53 year old with history of Hodgkin's lymphoma at age 66 status post chemoradiation, breast cancer s/p chemotherapy and bilateral mastectomy in remission, cervical cancer, ovarian tumor status post TAH and left oophorectomy in 31-Jul-2010, right in 08-01-2015, pulmonary arterial hypertension, chronic hypoxemic failure on 3 to 5 L oxygen  at home, Addison's disease and history of migraine who was recently admitted with nausea vomiting treated for pneumonia and also found to have esophageal candidiasis presented with about 4 days of intractable nausea vomiting, unable to take any medications, abdominal discomfort. In the emergency room hemodynamically stable.  Hemoglobin 12.2.  Lipase 62.  Potassium 2.6 and magnesium  1.9.  Chest x-ray with airspace disease in the retrocardiac region on the left, atelectasis.  CT angiogram with no acute intra-abdominal process.  Bilateral nephrolithiasis without evidence of obstruction.  Complex lesion in the lower pole of the left  kidney with indeterminate imaging characters.  Patient was given Solu-Cortef , lorazepam , potassium and Benadryl  and admitted to the hospital due to significant symptoms.  Patient stated not able to eat or drink for a long time as well unable to have meaningful water  intake. Significant event, 9/23, came back from upper GI endoscopy.  Patient had sudden onset left-sided chest pain and shortness of breath.  She was found to have large airspace disease and possible aspiration.  Started on antibiotics. Upper GI endoscopy with persistent candidiasis and esophageal stricture-dilated. 9/24, increasing work of breathing.  Extensive multilobar pneumonia.  Transferred to stepdown unit with heated high flow. DNR/DNI.  Developed ARDS like picture.  Treated symptomatically.  Consulted palliative care. 22-Jul-2024, persistent symptoms.  Shortness of breath and suffering.  Served with comfort care and hospice and died with family at the bedside under comfort care measures.  Acute on chronic hypoxemic respiratory failure, postprocedure aspiration of gastric contents: Multifocal pneumonia.  ARDS. Intractable nausea vomiting, esophageal stricture and candidiasis. Addison's disease on steroids Failure to thrive and esophageal candidiasis     Patient was treated with broad-spectrum antibiotics, antifungal agents and high flow oxygen .  Patient continued to require high flow oxygen , pain medications and anxiety medications for discomfort.  She has not been doing appropriate improvement despite optimal treatment.  2024-07-22, in coordination with palliative care team, patient served with comfort care pathway and she died peacefully.         Procedures: Upper GI endoscopy  Consultations: Gastroenterology, infectious disease, palliative care  The results of significant diagnostics from this hospitalization (including imaging, microbiology, ancillary and laboratory) are listed below for reference.   Significant Diagnostic  Studies: DG CHEST PORT  1 VIEW Result Date: July 03, 2024 EXAM: 1 VIEW(S) XRAY OF THE CHEST 07/03/24 05:03:00 AM COMPARISON: 06/25/2024 CLINICAL HISTORY: Pneumonia [id number]. pneumothorax FINDINGS: LINES, TUBES AND DEVICES: Left upper extremity PICC in place with tip in distal SVC, unchanged. LUNGS AND PLEURA: Low lung volumes. Diffuse bilateral interstitial and airspace opacities, slightly worsened from prior exam. No pulmonary edema. No pleural effusion. No pneumothorax. HEART AND MEDIASTINUM: No acute abnormality of the cardiac and mediastinal silhouettes. BONES AND SOFT TISSUES: Bilateral axillary surgical clips noted. No acute osseous abnormality. IMPRESSION: 1. Worsening diffuse bilateral interstitial and airspace opacities, compatible with multifocal pneumonia, diffuse pulmonary edema, or ards. 2. No pneumothorax. 3. Left upper extremity PICC with tip in the distal SVC, appropriately positioned. Electronically signed by: Waddell Calk MD Jul 03, 2024 06:12 AM EDT RP Workstation: [id number]   DG CHEST PORT 1 VIEW Result Date: 06/25/2024 CLINICAL DATA:  Pneumonia. EXAM: PORTABLE CHEST 1 VIEW COMPARISON:  Radiographs yesterday FINDINGS: Left upper extremity PICC tip in the SVC. Progression in heterogeneous bilateral lung opacities. Stable heart size and mediastinal contours. No pneumothorax. No large pleural effusion. Surgical clips in both axilla. IMPRESSION: Progression in heterogeneous bilateral lung opacities. This may represent pneumonia, edema, ARDS or combination there of. Electronically Signed   By: Andrea Gasman M.D.   On: 06/25/2024 10:55   DG CHEST PORT 1 VIEW Result Date: 06/24/2024 CLINICAL DATA:  Respiratory compromise EXAM: PORTABLE CHEST 1 VIEW COMPARISON:  06/24/2024 FINDINGS: Left PICC line remains in place, unchanged. Severe diffuse left lung airspace disease and patchy right lung airspace disease, worsening in the right upper lobe since prior study. No effusions or pneumothorax. No acute  bony abnormality. IMPRESSION: Severe bilateral airspace disease, left greater than right, worsening in the right upper lobe since prior study. Electronically Signed   By: Franky Crease M.D.   On: 06/24/2024 17:20   DG CHEST PORT 1 VIEW Result Date: 06/24/2024 CLINICAL DATA:  Pneumonia EXAM: PORTABLE CHEST 1 VIEW COMPARISON:  June 23, 2024 and prior studies FINDINGS: Unchanged left-sided PICC line with tip in the mid SVC. Multifocal bilateral airspace opacities throughout the left lung and in the right perihilar region and right upper lung with improved aeration of the left lung. No definite new pneumothorax. No obvious pleural effusions. Cardiomediastinal silhouette is partially obscured, limiting evaluation. No acute osseous findings. IMPRESSION: Extensive multifocal airspace opacities bilaterally, left greater than right. Interval decrease in left lung opacities and slightly increased opacities in the right upper lung. Electronically Signed   By: Michaeline Blanch M.D.   On: 06/24/2024 14:33   DG Abd Portable 1V Result Date: 06/24/2024 CLINICAL DATA:  Vomiting EXAM: PORTABLE ABDOMEN - 1 VIEW COMPARISON:  June 21, 2024 CT FINDINGS: Nonobstructive bowel gas pattern.  No acute osseous findings. IMPRESSION: Nonobstructive bowel gas pattern. Electronically Signed   By: Michaeline Blanch M.D.   On: 06/24/2024 14:29   DG Shoulder Left Port Result Date: 06/23/2024 CLINICAL DATA:  Fall, pain EXAM: LEFT SHOULDER COMPARISON:  None Available. FINDINGS: Degenerative changes in the [facility] joint with joint space narrowing and spurring. Glenohumeral joint is maintained. No acute bony abnormality. Specifically, no fracture, subluxation, or dislocation. Soft tissues are intact. Left PICC line in place with the tip in the SVC. Diffuse airspace disease throughout the visualized left lung. IMPRESSION: Mild degenerative changes in the left AC joint. No acute bony abnormality. Diffuse airspace disease throughout the visualized left  lung. Electronically Signed   By: Franky Crease M.D.   On: 06/23/2024 22:43   DG  CHEST PORT 1 VIEW Result Date: 06/23/2024 CLINICAL DATA:  Chest pain EXAM: PORTABLE CHEST 1 VIEW COMPARISON:  06/21/2024 FINDINGS: Left PICC line in place with the tip in the SVC. Interval placement of right PICC line with the tip in the upper SVC at the confluence of the innominate veins. Heart mediastinal contours within normal limits. Marked worsening of diffuse left lung airspace disease and right perihilar opacities most compatible with pneumonia. No visible effusions or pneumothorax. IMPRESSION: Right PICC line placement with the tip in the upper SVC at the confluence of the innominate veins. Marked worsening in left lung airspace disease and right perihilar opacities concerning for pneumonia. Electronically Signed   By: Franky Crease M.D.   On: 06/23/2024 18:42   DG Chest 2 View Result Date: 06/21/2024 CLINICAL DATA:  Nausea and vomiting. EXAM: CHEST - 2 VIEW COMPARISON:  05/18/2024. FINDINGS: The heart size and mediastinal contours are within normal limits. There is atherosclerotic calcification of the aorta. Focal airspace disease is noted in the retrocardiac region on the left. No effusion or pneumothorax is seen. A left PICC line appear stable. Surgical clips are noted in the chest walls bilaterally. No acute osseous abnormality. IMPRESSION: Airspace disease in the retrocardiac region on the left, possible residual pneumonia. Electronically Signed   By: Leita Birmingham M.D.   On: 06/21/2024 18:36   CT ABDOMEN PELVIS WO CONTRAST Result Date: 06/21/2024 CLINICAL DATA:  Left flank pain with vomiting. EXAM: CT ABDOMEN AND PELVIS WITHOUT CONTRAST TECHNIQUE: Multidetector CT imaging of the abdomen and pelvis was performed following the standard protocol without IV contrast. RADIATION DOSE REDUCTION: This exam was performed according to the departmental dose-optimization program which includes automated exposure control,  adjustment of the mA and/or kV according to patient size and/or use of iterative reconstruction technique. COMPARISON:  01/23/2023. FINDINGS: Lower chest: Atelectasis is noted at the lung bases. Hepatobiliary: Stable scattered subcentimeter hypodensities are noted in the liver, statistically most likely representing cysts or hemangiomas. No biliary ductal dilatation. The gallbladder is without stones. Pancreas: Unremarkable. No pancreatic ductal dilatation or surrounding inflammatory changes. Spleen: Normal in size without focal abnormality. Adrenals/Urinary Tract: The right adrenal gland is within normal limits. There is a 9 mm nodule in the left adrenal gland. No additional imaging is required at this time. There are subcentimeter hypodensities in the kidneys, possible cysts. An indeterminate exophytic lesion is noted in the left kidney measuring 1.3 cm. Renal calculi are present bilaterally. No obstructive uropathy is seen. The bladder is unremarkable. Stomach/Bowel: The stomach is within normal limits. No bowel obstruction, free air, or pneumatosis is seen. A moderate amount of retained stool is present in the colon. Appendix is surgically absent. Vascular/Lymphatic: Aortic atherosclerosis. No enlarged abdominal or pelvic lymph nodes. Reproductive: Status post hysterectomy. No adnexal masses. Other: No abdominopelvic ascites. A fat containing umbilical hernia is noted. There are bilateral breast implants. Musculoskeletal: Degenerative changes are present in the thoracolumbar spine. No acute osseous abnormality. IMPRESSION: 1. No acute intra-abdominal process. 2. Bilateral nephrolithiasis without evidence of obstruction. 3. Complex lesion in in the lower pole of the left kidney with indeterminate imaging characteristics. Ultrasound or MRI is recommended for further characterization on follow-up. 4. Moderate amount of retained stool in the colon suggesting constipation. 5. Aortic atherosclerosis. Electronically  Signed   By: Leita Birmingham M.D.   On: 06/21/2024 18:33    Microbiology: Recent Results (from the past 240 hours)  Culture, fungus without smear     Status: None (Preliminary result)  Collection Time: 06/23/24  1:23 PM   Specimen: Esophagus; Other  Result Value Ref Range Status   Specimen Description   Final    ESOPHAGUS Performed at [facility], [address] [place], [place] [address]    Special Requests   Final    NONE Performed at [facility], [address] [place], [place] [address]    Culture   Final    NO FUNGUS ISOLATED AFTER 5 DAYS Performed at [facility], [address] [place], [place] [address]    Report Status PENDING  Incomplete  Respiratory (~20 pathogens) panel by PCR     Status: None   Collection Time: 06/24/24  1:18 PM   Specimen: Nasopharyngeal Swab; Respiratory  Result Value Ref Range Status   Adenovirus NOT DETECTED NOT DETECTED Final   Coronavirus 229E NOT DETECTED NOT DETECTED Final    Comment: (NOTE) The Coronavirus on the Respiratory Panel, DOES NOT test for the novel  Coronavirus (2019 nCoV)    Coronavirus HKU1 NOT DETECTED NOT DETECTED Final   Coronavirus NL63 NOT DETECTED NOT DETECTED Final   Coronavirus OC43 NOT DETECTED NOT DETECTED Final   Metapneumovirus NOT DETECTED NOT DETECTED Final   Rhinovirus / Enterovirus NOT DETECTED NOT DETECTED Final   Influenza A NOT DETECTED NOT DETECTED Final   Influenza B NOT DETECTED NOT DETECTED Final   Parainfluenza Virus 1 NOT DETECTED NOT DETECTED Final   Parainfluenza Virus 2 NOT DETECTED NOT DETECTED Final   Parainfluenza Virus 3 NOT DETECTED NOT DETECTED Final   Parainfluenza Virus 4 NOT DETECTED NOT DETECTED Final   Respiratory Syncytial Virus NOT DETECTED NOT DETECTED Final   Bordetella pertussis NOT DETECTED NOT DETECTED Final   Bordetella Parapertussis NOT DETECTED NOT DETECTED Final   Chlamydophila pneumoniae NOT DETECTED NOT DETECTED Final    Mycoplasma pneumoniae NOT DETECTED NOT DETECTED Final    Comment: Performed at [facility], [address], [place], [place] [address]  MRSA Next Gen by PCR, Nasal     Status: None   Collection Time: 06/24/24  5:44 PM   Specimen: Nasal Mucosa; Nasal Swab  Result Value Ref Range Status   MRSA by PCR Next Gen NOT DETECTED NOT DETECTED Final    Comment: (NOTE) The GeneXpert MRSA Assay (FDA approved for NASAL specimens only), is one component of a comprehensive MRSA colonization surveillance program. It is not intended to diagnose MRSA infection nor to guide or monitor treatment for MRSA infections. Test performance is not FDA approved in patients less than 22 years old. Performed at [facility], [address], [place], [place] [address]     Time spent: 0 minutes  Signed: Renato Applebaum, MD 04-Jul-2024

## 2024-07-01 NOTE — Plan of Care (Signed)
  Problem: Education: Goal: Knowledge of General Education information will improve Description: Including pain rating scale, medication(s)/side effects and non-pharmacologic comfort measures Outcome: Progressing   Problem: Health Behavior/Discharge Planning: Goal: Ability to manage health-related needs will improve Outcome: Progressing   Problem: Clinical Measurements: Goal: Cardiovascular complication will be avoided Outcome: Progressing   Problem: Activity: Goal: Risk for activity intolerance will decrease Outcome: Progressing   Problem: Pain Managment: Goal: General experience of comfort will improve and/or be controlled Outcome: Progressing   Problem: Role Relationship: Goal: Family's ability to cope with current situation will improve Outcome: Progressing Goal: Ability to verbalize concerns, feelings, and thoughts to partner or family member will improve Outcome: Progressing   Problem: Pain Management: Goal: Satisfaction with pain management regimen will improve Outcome: Progressing

## 2024-07-01 NOTE — Telephone Encounter (Unsigned)
 Copied from CRM [id number]. Topic: General - Other >> Jul 01, 2024  9:06 AM Leila C wrote: Reason for CRM: Patient's spouse Charlie [phone] states patient passed away last 07-25-24 and Dr. Kassie had prescribed a [employer] a chest vest is asking if the office know where to donated this for family that are not fortunate. Please advise and call back.

## 2024-07-01 NOTE — Plan of Care (Signed)
  Problem: Clinical Measurements: Goal: Ability to maintain clinical measurements within normal limits will improve Outcome: Progressing   Problem: Nutrition: Goal: Adequate nutrition will be maintained Outcome: Progressing   Problem: Clinical Measurements: Goal: Respiratory complications will improve Outcome: Not Progressing   Problem: Coping: Goal: Level of anxiety will decrease Outcome: Not Progressing   Problem: Elimination: Goal: Will not experience complications related to bowel motility Outcome: Not Progressing Goal: Will not experience complications related to urinary retention Outcome: Not Progressing

## 2024-07-01 NOTE — Progress Notes (Signed)
 Pt was removed from telemetry monitoring per request of patient and family to make comfortable while transitioning to comfort care with palliative DO at bedside. This RN was requested to wait until patient is more comfortable to re apply the monitor. Currently patient is resting in bed, chest rise and fall noted, no signs of discomfort at this time.

## 2024-07-01 NOTE — Plan of Care (Signed)
 Patient expired 2045

## 2024-07-01 NOTE — Progress Notes (Signed)
 Patient TOD 2045. WENDI Cone, NP notified. Family at bedside during patient's passing. Belongings taken home with family. Family will contact patient placement with name of funeral home.

## 2024-07-01 NOTE — Progress Notes (Signed)
 RT NOTE:  Pt is currently on comfort care measures. RT went to check pt [facility] and patient and family both wished to keep it off at this time.

## 2024-07-01 NOTE — Telephone Encounter (Signed)
 Contacted [employer] and spoke with Isaac.he reported he recently called patient and left message. He explained SmartVest units cannot be donated to others since they are prescription items and repairs would not be possible. Mrs. Teffeteller may return the vest directly to [employer], they will provide a UPS box for return. Called Mr. Ramaswamy to update him; no answer, left voicemail with details and Issac's direct number. Requested call back with any questions. Nothing further is needed at this time.

## 2024-07-01 NOTE — Progress Notes (Signed)
 PROGRESS NOTE    [name]  FMW:[id number] DOB: [birth year] DOA: 06/21/2024 PCP: Toppin, Jean M, MD    Brief Narrative:  53 year old with history of Hodgkin's lymphoma at age 12 status post chemoradiation, breast cancer s/p chemotherapy and bilateral mastectomy in remission, cervical cancer, ovarian tumor status post TAH and left oophorectomy in 2011, right in 2016, pulmonary arterial hypertension, chronic hypoxemic failure on 3 to 5 L oxygen  at home, Addison's disease and history of migraine who was recently admitted with nausea vomiting treated for pneumonia and also found to have esophageal candidiasis presented with about 4 days of intractable nausea vomiting, unable to take any medications, abdominal discomfort. In the emergency room hemodynamically stable.  Hemoglobin 12.2.  Lipase 62.  Potassium 2.6 and magnesium  1.9.  Chest x-ray with airspace disease in the retrocardiac region on the left, atelectasis.  CT angiogram with no acute intra-abdominal process.  Bilateral nephrolithiasis without evidence of obstruction.  Complex lesion in the lower pole of the left kidney with indeterminate imaging characters.  Patient was given Solu-Cortef , lorazepam , potassium and Benadryl  and admitted to the hospital due to significant symptoms.  Patient stated not able to eat or drink for a long time as well unable to have meaningful water  intake.  Significant event, 9/23, came back from upper GI endoscopy.  Patient had sudden onset left-sided chest pain and shortness of breath.  She was found to have large airspace disease and possible aspiration.  Started on antibiotics. Upper GI endoscopy with persistent candidiasis and esophageal stricture-dilated. 9/24, increasing work of breathing.  Extensive multilobar pneumonia.  Transferred to stepdown unit with heated high flow. DNR/DNI. 2024-07-14, comfort care  Subjective:  Patient seen and examined.  Husband at the bedside.  He was trying to feed her ice cream.   Patient was today more coherent and was able to have good conversation.  She tells me that she would like to go home whenever possible. In the morning rounds, we discussed about going home with home hospice once her oxygenation improved. 1 dose of Lasix  was given. I was notified by palliative care team that patient was converted to inpatient comfort care.   Assessment & Plan:   Acute on chronic hypoxemic respiratory failure, postprocedure aspiration of gastric contents: Multifocal pneumonia.  ARDS. Intractable nausea vomiting, esophageal stricture and candidiasis. Addison's disease on steroids Failure to thrive and esophageal candidiasis   Patient was treated with broad-spectrum antibiotics, antifungal agents and high flow oxygen .  Patient continued to require high flow oxygen , pain medications and anxiety medications for discomfort.  She has not been doing appropriate improvement despite optimal treatment.  07-14-24, in coordination with palliative care team, patient is changing to comfort care and hospice.    DVT prophylaxis: Comfort care   Code Status: DNR/DNI Family Communication: Husband at the bedside Disposition Plan: Status is: Inpatient Remains inpatient appropriate because: Significant symptoms, unable to eat, IV antibiotics, significant oxygen .     Consultants:  Gastroenterology IR Infectious disease Palliative care  Procedures:  Upper GI endoscopy  Antimicrobials:  Zosyn  9/23-9/27 Micafungin  9/23-9/27     Objective: Vitals:   07-14-2024 0845 July 14, 2024 0846 July 14, 2024 0900 07/14/2024 1000  BP:      Pulse:   (!) 106   Resp:   (!) 39 19  Temp: 98.4 F (36.9 C) 98.4 F (36.9 C)    TempSrc: Oral Oral    SpO2:   (!) 80%   Weight:      Height:  Intake/Output Summary (Last 24 hours) at 07-10-24 1237 Last data filed at 07-10-24 1056 Gross per 24 hour  Intake 1129.8 ml  Output 1600 ml  Net -470.2 ml   Filed Weights   06/21/24 1955 06/21/24 2103  06/24/24 1726  Weight: 75.2 kg 75.2 kg 80.7 kg    Examination:  General exam: Looked comfortable in the morning rounds.  She was slightly confused over nasal cannula.  She was able to keep up conversation.  Mild anxiety persist. Respiratory system:  Poor inspiratory effort.  No additional sounds.  On high flow oxygen . SpO2: (!) 80 % O2 Flow Rate (L/min): 40 L/min FiO2 (%): 50 %  Cardiovascular system: S1 & S2 heard, RRR. No JVD, murmurs, rubs, gallops or clicks.  No edema.  Gastrointestinal system: Abdomen is nondistended, soft and nontender. No organomegaly or masses felt. Normal bowel sounds heard. Central nervous system: Alert and oriented. No focal neurological deficits.  Grossly weak.    Data Reviewed: I have personally reviewed following labs and imaging studies  CBC: Recent Labs  Lab 06/21/24 1701 06/21/24 2200 06/22/24 0331 06/23/24 1108 06/25/24 0559 06/26/24 0625 July 10, 2024 0359  WBC 7.2   < > 5.1 7.2 13.3* 12.4* 12.3*  NEUTROABS 3.9  --   --  5.5 11.6*  --  11.4*  HGB 12.2   < > 10.5* 10.5* 9.8* 8.6* 8.3*  HCT 38.5   < > 34.0* 35.0* 31.9* 29.0* 28.4*  MCV 76.5*   < > 78.7* 81.0 80.2 81.0 82.3  PLT 271   < > 209 241 206 178 198   < > = values in this interval not displayed.   Basic Metabolic Panel: Recent Labs  Lab 06/21/24 1701 06/21/24 2200 06/22/24 0331 06/23/24 1108 06/25/24 0559 07/10/24 0359  NA 139 137 140 143 143 138  K 2.6* 3.6 3.2* 4.2 3.4* 4.0  CL 98 103 105 112* 110 104  CO2 23 17* 21* 20* 21* 24  GLUCOSE 129* 110* 164* 121* 133* 167*  BUN 27* 26* 20 15 16 20   CREATININE 1.93* 1.56* 1.43* 1.05* 1.06* 0.84  CALCIUM  9.6 8.3* 8.1* 8.3* 8.2* 8.9  MG 1.9 2.2  --  2.3 1.9  --   PHOS  --  2.8  --  1.7* 2.5  --    GFR: Estimated Creatinine Clearance: 81.3 mL/min (by C-G formula based on SCr of 0.84 mg/dL). Liver Function Tests: Recent Labs  Lab 06/21/24 1701 06/22/24 0331 06/23/24 1108 06/25/24 0559  AST 79* 74* 47* 33  ALT 136* 119* 98*  56*  ALKPHOS 99 79 75 61  BILITOT 0.4 <0.2 0.2 0.5  PROT 8.5* 6.9 6.4* 6.2*  ALBUMIN 4.8 4.0 3.9 3.4*   Recent Labs  Lab 06/21/24 1701  LIPASE 62*   No results for input(s): AMMONIA in the last 168 hours. Coagulation Profile: Recent Labs  Lab 06/23/24 1108  INR 1.0   Cardiac Enzymes: No results for input(s): CKTOTAL, CKMB, CKMBINDEX, TROPONINI in the last 168 hours. BNP (last 3 results) No results for input(s): PROBNP in the last 8760 hours. HbA1C: No results for input(s): HGBA1C in the last 72 hours. CBG: Recent Labs  Lab 06/24/24 0747  GLUCAP 116*   Lipid Profile: No results for input(s): CHOL, HDL, LDLCALC, TRIG, CHOLHDL, LDLDIRECT in the last 72 hours. Thyroid  Function Tests: No results for input(s): TSH, T4TOTAL, FREET4, T3FREE, THYROIDAB in the last 72 hours. Anemia Panel: Recent Labs    06/26/24 0625  VITAMINB12 575  FERRITIN 93  TIBC 242*  IRON <10*   Sepsis Labs: Recent Labs  Lab 06/21/24 2200 06/22/24 0330  PROCALCITON <0.10  --   LATICACIDVEN  --  0.8    Recent Results (from the past 240 hours)  Culture, fungus without smear     Status: None (Preliminary result)   Collection Time: 06/23/24  1:23 PM   Specimen: Esophagus; Other  Result Value Ref Range Status   Specimen Description   Final    ESOPHAGUS Performed at [facility], [address] [place], [place] [address]    Special Requests   Final    NONE Performed at [facility], [address], [place], [place] [address]    Culture   Final    NO FUNGUS IN 4 DAYS Performed at [facility], [address], [place], [place] [address]    Report Status PENDING  Incomplete  Respiratory (~20 pathogens) panel by PCR     Status: None   Collection Time: 06/24/24  1:18 PM   Specimen: Nasopharyngeal Swab; Respiratory  Result Value Ref Range Status   Adenovirus NOT DETECTED NOT DETECTED Final   Coronavirus 229E  NOT DETECTED NOT DETECTED Final    Comment: (NOTE) The Coronavirus on the Respiratory Panel, DOES NOT test for the novel  Coronavirus (2019 nCoV)    Coronavirus HKU1 NOT DETECTED NOT DETECTED Final   Coronavirus NL63 NOT DETECTED NOT DETECTED Final   Coronavirus OC43 NOT DETECTED NOT DETECTED Final   Metapneumovirus NOT DETECTED NOT DETECTED Final   Rhinovirus / Enterovirus NOT DETECTED NOT DETECTED Final   Influenza A NOT DETECTED NOT DETECTED Final   Influenza B NOT DETECTED NOT DETECTED Final   Parainfluenza Virus 1 NOT DETECTED NOT DETECTED Final   Parainfluenza Virus 2 NOT DETECTED NOT DETECTED Final   Parainfluenza Virus 3 NOT DETECTED NOT DETECTED Final   Parainfluenza Virus 4 NOT DETECTED NOT DETECTED Final   Respiratory Syncytial Virus NOT DETECTED NOT DETECTED Final   Bordetella pertussis NOT DETECTED NOT DETECTED Final   Bordetella Parapertussis NOT DETECTED NOT DETECTED Final   Chlamydophila pneumoniae NOT DETECTED NOT DETECTED Final   Mycoplasma pneumoniae NOT DETECTED NOT DETECTED Final    Comment: Performed at [facility], [address], [place], [place] [address]  MRSA Next Gen by PCR, Nasal     Status: None   Collection Time: 06/24/24  5:44 PM   Specimen: Nasal Mucosa; Nasal Swab  Result Value Ref Range Status   MRSA by PCR Next Gen NOT DETECTED NOT DETECTED Final    Comment: (NOTE) The GeneXpert MRSA Assay (FDA approved for NASAL specimens only), is one component of a comprehensive MRSA colonization surveillance program. It is not intended to diagnose MRSA infection nor to guide or monitor treatment for MRSA infections. Test performance is not FDA approved in patients less than 46 years old. Performed at [facility], [address] [place], [place] [address]          Radiology Studies: DG CHEST PORT 1 VIEW Result Date: July 09, 2024 EXAM: 1 VIEW(S) XRAY OF THE CHEST 07-09-2024 05:03:00 AM COMPARISON: 06/25/2024 CLINICAL HISTORY:  Pneumonia [id number]. pneumothorax FINDINGS: LINES, TUBES AND DEVICES: Left upper extremity PICC in place with tip in distal SVC, unchanged. LUNGS AND PLEURA: Low lung volumes. Diffuse bilateral interstitial and airspace opacities, slightly worsened from prior exam. No pulmonary edema. No pleural effusion. No pneumothorax. HEART AND MEDIASTINUM: No acute abnormality of the cardiac and mediastinal silhouettes.  BONES AND SOFT TISSUES: Bilateral axillary surgical clips noted. No acute osseous abnormality. IMPRESSION: 1. Worsening diffuse bilateral interstitial and airspace opacities, compatible with multifocal pneumonia, diffuse pulmonary edema, or ards. 2. No pneumothorax. 3. Left upper extremity PICC with tip in the distal SVC, appropriately positioned. Electronically signed by: Taylor Stroud MD Jun 28, 2024 06:12 AM EDT RP Workstation: [id number]        Scheduled Meds:  albuterol   2.5 mg Nebulization Q6H   budesonide  (PULMICORT ) nebulizer solution  0.25 mg Nebulization BID   buPROPion   150 mg Oral Daily   FLUoxetine   40 mg Oral Daily   hydrocortisone  sod succinate (SOLU-CORTEF ) inj  100 mg Intravenous Q12H   lamoTRIgine   200 mg Oral QHS   lamoTRIgine   50 mg Oral Daily   levothyroxine   100 mcg Oral q morning   OLANZapine   5 mg Oral QHS   pantoprazole  (PROTONIX ) IV  40 mg Intravenous Q12H   sodium chloride  flush  10-40 mL Intracatheter Q12H   topiramate   150 mg Oral QHS   umeclidinium bromide   1 puff Inhalation Daily   Continuous Infusions:     LOS: 6 days    Time spent: 55 minutes   Renato Applebaum, MD [facility]

## 2024-07-01 NOTE — Progress Notes (Signed)
 Daily Progress Note   Patient Name: [name]       Date: 2024-07-09 DOB: 1970-11-04  Age: 53 y.o. MRN#: [id number] Attending Physician: Raenelle Coria, MD Primary Care Physician: Toppin, Jean M, MD Admit Date: 06/21/2024 Length of Stay: 6 days  Reason for Consultation/Follow-up: Establishing goals of care  Subjective:   CC: Patient having worsening pain.  Following up regarding complex medical decision making.  Subjective:  Reviewed EMR including recent documentation from hospitalist.  At time of EMR review in past 24 hours patient has received IV Dilaudid  1 mg x 9 doses.  Personally reviewed chest x-ray obtained today noting worsening of bilateral infiltrates.  Patient receiving HHFNC 40 L/min at 50%. Discussed care with bedside RN for medical updates.  Patient having worsening agitation and removing HHFNC frequently with drop in oxygen  saturations to the 60s.  Nursing noted patient crying out in pain every 5 minutes.  ------------------------------------------------------------------------------------------------------------- Advance Care Planning Conversation  Pertinent diagnosis:Multiple cancers in remission, Addison's disease, acute on chronic respiratory failure, aspiration pneumonia, failure to thrive, dysphagia  The patient and family consented to a voluntary Advance Care Planning Conversation in person. Individuals present for the conversation: Patient, patient's husband, this provider  Summary of the conversation:  Presented to bedside to see patient as.  At time of visit patient very awake and interactive.  Patient's husband at bedside.  Initially other family present at bedside though stepped out for conversation.  Discussed with patient and husband that patient's chest x-ray is worsening.  Patient quickly desaturates with removal of HHFNC.  Patient having worsening pain which patient confirms.  Discussed at this time considering transition to full comfort focused care.   Able to discuss with patient plan would be to start medications continuously for pain and anxiety and allow her to receive palliative sedation with plan to remove O2 support when she was completely comfortable and would expect her to die peacefully.  Patient interactive asking appropriate questions regarding this.  Patient acknowledges that she is suffering does not want to be in this pain anymore.  Discussed at this time would have husband call in with family members who patient wants to visit.  Patient would get some time with her son to say goodbye.  Once patient has had time to visit, would then start medications for comfort and proceed with palliative high flow wean.  Patient and husband agreeing with this plan.  Informed later in the afternoon that patient and family are ready to proceed with palliative high flow wean.  Discussed care with patient's husband and son to provide updates.  Discussed care with patient who is noting she is in pain and just wants to be comfortable.  Will start full comfort focused medications and remove O2 support once patient comfortable from palliative sedation.  Outcome of the conversations and/or documents completed:  Transition to comfort focused care.  Proceeding with terminal sedation for palliative high flow wean.  I spent 50 minutes providing separately identifiable ACP services with the patient and/or surrogate decision maker in a voluntary, in-person conversation discussing the patient's wishes and goals as detailed in the above note.  Tinnie Radar, DO Palliative Medicine Provider  -------------------------------------------------------------------------------------------------------------  Discussed care with nursing throughout the day for updates.  Discussed care with hospitalist, RN, and chaplain throughout the day to coordinate care.  Objective:   Vital Signs:  BP 109/70   Pulse 85   Temp (!) 96.7 F (35.9 C) (Axillary) Comment: RN aware  Resp 14  Ht 5' 5 (1.651 m)   Wt 80.7 kg   LMP  (LMP Unknown)   SpO2 98%   BMI 29.61 kg/m   Physical Exam: General: Awake, ill-appearing, appropriately answering questions, grimacing at times  Cardiovascular: Tachycardia noted Respiratory: increased work of breathing noted intermittently, on HHFNC 40 L/min at FiO2 50 % Right: Appropriately tearful  Assessment & Plan:   Assessment: Patient is a 53 year old female with a past medical history of Hodgkin's lymphoma at age 24 status post chemoradiation, breast cancer status postchemotherapy and bilateral mastectomy in remission, cervical cancer in remission, and ovarian tumor status post TAH and left oophorectomy in 2011 and right oophorectomy in 2016, pulmonary arterial hypertension, Addison's disease, migraines, and chronic hypoxic respiratory failure on 3-5 L oxygen  at home who was admitted on 06/21/2024 for management of intractable nausea and vomiting associated with abdominal discomfort. During hospitalization, patient underwent upper GI endoscopy on 06/23/2024 for further workup. After this patient had sudden onset of left-sided chest pain and shortness of breath and was found to have large airspace disease in postprocedural aspiration leading to multifocal pneumonia. EGD did show persistent candidiasis and esophageal stricture present which was dilated. Palliative medicine team consulted to assist with complex medical decision making.   Recommendations/Plan: # Complex medical decision making/goals of care:     - Discussed care extensively with patient and husband as detailed above in HPI.  Patient having worsening pain noting she is suffering.  After discussion, going to proceed with transition to comfort focused care and terminal high flow wean with anticipated hospital death.  Patient wanting to receive palliative sedation so as does not want to suffer at the end of life. HHFNC will only be removed once patient completely comfortable with palliative  sedation.  Palliative medicine continuing to follow with patient's medical journey. -At this time we will discontinue interventions that are no longer focused on comfort such as IV fluids, imaging, or lab work.  Will instead focus on symptom management of pain, dyspnea, and agitation in the setting of end-of-life care.    Code Status: Do not attempt resuscitation (DNR) - Comfort care   # Symptom management # Symptom management Patient is receiving these palliative interventions for symptom management with an intent to improve quality of life.     -Pain/Dyspnea, acute in the setting of end-of-life care                               -Start IV Dilaudid  titratable continuous infusion with RN boluses every 15 minutes.   - Start titratable propofol  infusion.                 -Anxiety/agitation, in the setting of end-of-life care                               - Change IV Ativan  to 1 mg every 4 hours as needed                               -Start IV Haldol 2 mg every 4 hours as needed                 -Secretions, in the setting of end-of-life care                               -  Start IV glycopyrrolate 0.2 mg every 4 hours as needed.  # Psycho-social/Spiritual Support:  - Support System: Husband, 67 year old son with autism, 1 stepdaughter, 1 daughter who is estranged - Desire for further Chaplain support:yes   # Discharge Planning: Anticipated Hospital death   Discussed with: Patient, patient's husband, patient's son, RN, hospitalist  Thank you for allowing the palliative care team to participate in the care Emmie CHRISTELLA Cedar.  Tinnie Radar, DO Palliative Care Provider PMT # [phone]  If patient remains symptomatic despite maximum doses, please call PMT at [phone] between 0700 and 1900. Outside of these hours, please call attending, as PMT does not have night coverage.

## 2024-07-01 DEATH — deceased

## 2024-07-03 ENCOUNTER — Ambulatory Visit: Admitting: Psychiatry

## 2024-07-07 ENCOUNTER — Telehealth: Payer: Self-pay | Admitting: Infectious Diseases

## 2024-07-08 NOTE — Telephone Encounter (Signed)
 error

## 2024-07-15 ENCOUNTER — Inpatient Hospital Stay: Payer: Self-pay | Admitting: Infectious Diseases

## 2024-07-15 LAB — CULTURE, FUNGUS WITHOUT SMEAR

## 2024-08-04 ENCOUNTER — Ambulatory Visit (HOSPITAL_COMMUNITY): Admit: 2024-08-04 | Admitting: Gastroenterology

## 2024-08-04 SURGERY — EGD (ESOPHAGOGASTRODUODENOSCOPY)
Anesthesia: Monitor Anesthesia Care

## 2024-08-05 ENCOUNTER — Ambulatory Visit (HOSPITAL_COMMUNITY): Admit: 2024-08-05 | Admitting: Gastroenterology

## 2024-08-05 SURGERY — MANOMETRY, ESOPHAGUS
